# Patient Record
Sex: Female | Born: 1950 | Race: Black or African American | Hispanic: No | Marital: Single | Smoking: Former smoker
Health system: Southern US, Community
[De-identification: ages and names within clinical notes are randomized; demographics above are authoritative.]

## PROBLEM LIST (undated history)

## (undated) DIAGNOSIS — K219 Gastro-esophageal reflux disease without esophagitis: Secondary | ICD-10-CM

## (undated) DIAGNOSIS — M1990 Unspecified osteoarthritis, unspecified site: Secondary | ICD-10-CM

## (undated) DIAGNOSIS — I5022 Chronic systolic (congestive) heart failure: Secondary | ICD-10-CM

## (undated) DIAGNOSIS — M545 Low back pain, unspecified: Secondary | ICD-10-CM

## (undated) DIAGNOSIS — Z923 Personal history of irradiation: Secondary | ICD-10-CM

## (undated) DIAGNOSIS — I639 Cerebral infarction, unspecified: Secondary | ICD-10-CM

## (undated) DIAGNOSIS — D259 Leiomyoma of uterus, unspecified: Secondary | ICD-10-CM

## (undated) DIAGNOSIS — Z972 Presence of dental prosthetic device (complete) (partial): Secondary | ICD-10-CM

## (undated) DIAGNOSIS — M779 Enthesopathy, unspecified: Secondary | ICD-10-CM

## (undated) DIAGNOSIS — I779 Disorder of arteries and arterioles, unspecified: Secondary | ICD-10-CM

## (undated) DIAGNOSIS — D649 Anemia, unspecified: Secondary | ICD-10-CM

## (undated) DIAGNOSIS — R0602 Shortness of breath: Secondary | ICD-10-CM

## (undated) DIAGNOSIS — D0510 Intraductal carcinoma in situ of unspecified breast: Secondary | ICD-10-CM

## (undated) DIAGNOSIS — N184 Chronic kidney disease, stage 4 (severe): Secondary | ICD-10-CM

## (undated) DIAGNOSIS — R232 Flushing: Secondary | ICD-10-CM

## (undated) DIAGNOSIS — C50919 Malignant neoplasm of unspecified site of unspecified female breast: Secondary | ICD-10-CM

## (undated) DIAGNOSIS — B1920 Unspecified viral hepatitis C without hepatic coma: Secondary | ICD-10-CM

## (undated) DIAGNOSIS — M48061 Spinal stenosis, lumbar region without neurogenic claudication: Secondary | ICD-10-CM

## (undated) DIAGNOSIS — I10 Essential (primary) hypertension: Secondary | ICD-10-CM

## (undated) DIAGNOSIS — N201 Calculus of ureter: Secondary | ICD-10-CM

## (undated) DIAGNOSIS — J9811 Atelectasis: Secondary | ICD-10-CM

## (undated) HISTORY — PX: BACK SURGERY: SHX140

## (undated) HISTORY — DX: Chronic systolic (congestive) heart failure: I50.22

## (undated) HISTORY — DX: Anemia, unspecified: D64.9

## (undated) HISTORY — DX: Atelectasis: J98.11

## (undated) HISTORY — DX: Gastro-esophageal reflux disease without esophagitis: K21.9

## (undated) HISTORY — DX: Leiomyoma of uterus, unspecified: D25.9

## (undated) HISTORY — DX: Malignant neoplasm of unspecified site of unspecified female breast: C50.919

## (undated) HISTORY — DX: Disorder of arteries and arterioles, unspecified: I77.9

## (undated) HISTORY — DX: Unspecified osteoarthritis, unspecified site: M19.90

## (undated) HISTORY — DX: Flushing: R23.2

## (undated) HISTORY — DX: Low back pain: M54.5

## (undated) HISTORY — DX: Cerebral infarction, unspecified: I63.9

## (undated) HISTORY — DX: Low back pain, unspecified: M54.50

## (undated) HISTORY — DX: Calculus of ureter: N20.1

## (undated) HISTORY — DX: Enthesopathy, unspecified: M77.9

## (undated) HISTORY — DX: Unspecified viral hepatitis C without hepatic coma: B19.20

## (undated) HISTORY — DX: Chronic kidney disease, stage 4 (severe): N18.4

## (undated) HISTORY — DX: Intraductal carcinoma in situ of unspecified breast: D05.10

## (undated) HISTORY — DX: Essential (primary) hypertension: I10

## (undated) HISTORY — DX: Spinal stenosis, lumbar region without neurogenic claudication: M48.061

## (undated) HISTORY — DX: Personal history of irradiation: Z92.3

---

## 1998-05-17 ENCOUNTER — Encounter: Payer: Self-pay | Admitting: Emergency Medicine

## 1998-05-17 ENCOUNTER — Inpatient Hospital Stay (HOSPITAL_COMMUNITY): Admission: EM | Admit: 1998-05-17 | Discharge: 1998-05-19 | Payer: Self-pay | Admitting: Emergency Medicine

## 1998-05-18 ENCOUNTER — Encounter: Payer: Self-pay | Admitting: Internal Medicine

## 1998-06-05 ENCOUNTER — Ambulatory Visit (HOSPITAL_COMMUNITY): Admission: RE | Admit: 1998-06-05 | Discharge: 1998-06-05 | Payer: Self-pay | Admitting: *Deleted

## 1999-08-15 ENCOUNTER — Ambulatory Visit (HOSPITAL_COMMUNITY): Admission: RE | Admit: 1999-08-15 | Discharge: 1999-08-15 | Payer: Self-pay | Admitting: Internal Medicine

## 1999-08-22 ENCOUNTER — Ambulatory Visit (HOSPITAL_COMMUNITY): Admission: RE | Admit: 1999-08-22 | Discharge: 1999-08-22 | Payer: Self-pay

## 2000-06-01 DIAGNOSIS — N201 Calculus of ureter: Secondary | ICD-10-CM

## 2000-06-01 DIAGNOSIS — J9811 Atelectasis: Secondary | ICD-10-CM

## 2000-06-01 HISTORY — DX: Atelectasis: J98.11

## 2000-06-01 HISTORY — DX: Calculus of ureter: N20.1

## 2000-06-21 ENCOUNTER — Emergency Department (HOSPITAL_COMMUNITY): Admission: EM | Admit: 2000-06-21 | Discharge: 2000-06-21 | Payer: Self-pay | Admitting: Emergency Medicine

## 2000-09-11 ENCOUNTER — Emergency Department (HOSPITAL_COMMUNITY): Admission: EM | Admit: 2000-09-11 | Discharge: 2000-09-12 | Payer: Self-pay | Admitting: *Deleted

## 2000-09-11 ENCOUNTER — Encounter: Payer: Self-pay | Admitting: Emergency Medicine

## 2000-09-12 ENCOUNTER — Encounter: Payer: Self-pay | Admitting: Emergency Medicine

## 2000-09-13 ENCOUNTER — Emergency Department (HOSPITAL_COMMUNITY): Admission: EM | Admit: 2000-09-13 | Discharge: 2000-09-13 | Payer: Self-pay | Admitting: Emergency Medicine

## 2000-09-20 ENCOUNTER — Encounter: Payer: Self-pay | Admitting: Urology

## 2000-09-20 ENCOUNTER — Ambulatory Visit (HOSPITAL_COMMUNITY): Admission: RE | Admit: 2000-09-20 | Discharge: 2000-09-20 | Payer: Self-pay | Admitting: Urology

## 2000-11-11 ENCOUNTER — Encounter: Admission: RE | Admit: 2000-11-11 | Discharge: 2000-11-11 | Payer: Self-pay | Admitting: Obstetrics

## 2001-01-11 ENCOUNTER — Encounter (INDEPENDENT_AMBULATORY_CARE_PROVIDER_SITE_OTHER): Payer: Self-pay | Admitting: *Deleted

## 2001-01-11 ENCOUNTER — Encounter: Admission: RE | Admit: 2001-01-11 | Discharge: 2001-01-11 | Payer: Self-pay | Admitting: Obstetrics & Gynecology

## 2001-08-01 ENCOUNTER — Encounter: Admission: RE | Admit: 2001-08-01 | Discharge: 2001-08-01 | Payer: Self-pay | Admitting: Family Medicine

## 2001-08-01 ENCOUNTER — Encounter: Payer: Self-pay | Admitting: Family Medicine

## 2001-11-05 ENCOUNTER — Emergency Department (HOSPITAL_COMMUNITY): Admission: EM | Admit: 2001-11-05 | Discharge: 2001-11-05 | Payer: Self-pay | Admitting: Emergency Medicine

## 2001-11-25 ENCOUNTER — Ambulatory Visit (HOSPITAL_COMMUNITY): Admission: RE | Admit: 2001-11-25 | Discharge: 2001-11-25 | Payer: Self-pay | Admitting: Internal Medicine

## 2001-11-25 ENCOUNTER — Encounter: Payer: Self-pay | Admitting: Internal Medicine

## 2002-06-01 DIAGNOSIS — C50919 Malignant neoplasm of unspecified site of unspecified female breast: Secondary | ICD-10-CM

## 2002-06-01 HISTORY — DX: Malignant neoplasm of unspecified site of unspecified female breast: C50.919

## 2002-10-26 ENCOUNTER — Encounter: Admission: RE | Admit: 2002-10-26 | Discharge: 2002-11-29 | Payer: Self-pay | Admitting: Internal Medicine

## 2003-01-31 HISTORY — PX: BREAST LUMPECTOMY: SHX2

## 2003-02-08 ENCOUNTER — Encounter: Payer: Self-pay | Admitting: Family Medicine

## 2003-02-08 ENCOUNTER — Encounter: Admission: RE | Admit: 2003-02-08 | Discharge: 2003-02-08 | Payer: Self-pay | Admitting: Family Medicine

## 2003-02-14 ENCOUNTER — Encounter: Admission: RE | Admit: 2003-02-14 | Discharge: 2003-02-14 | Payer: Self-pay | Admitting: Family Medicine

## 2003-02-14 ENCOUNTER — Encounter (INDEPENDENT_AMBULATORY_CARE_PROVIDER_SITE_OTHER): Payer: Self-pay | Admitting: *Deleted

## 2003-02-14 ENCOUNTER — Encounter: Payer: Self-pay | Admitting: Family Medicine

## 2003-02-21 ENCOUNTER — Encounter (HOSPITAL_COMMUNITY): Admission: RE | Admit: 2003-02-21 | Discharge: 2003-05-22 | Payer: Self-pay | Admitting: General Surgery

## 2003-02-21 ENCOUNTER — Encounter: Payer: Self-pay | Admitting: General Surgery

## 2003-02-22 ENCOUNTER — Encounter: Payer: Self-pay | Admitting: General Surgery

## 2003-02-26 ENCOUNTER — Ambulatory Visit (HOSPITAL_COMMUNITY): Admission: RE | Admit: 2003-02-26 | Discharge: 2003-02-26 | Payer: Self-pay | Admitting: General Surgery

## 2003-02-26 ENCOUNTER — Encounter: Payer: Self-pay | Admitting: General Surgery

## 2003-02-26 ENCOUNTER — Encounter (INDEPENDENT_AMBULATORY_CARE_PROVIDER_SITE_OTHER): Payer: Self-pay | Admitting: *Deleted

## 2003-02-26 ENCOUNTER — Ambulatory Visit (HOSPITAL_BASED_OUTPATIENT_CLINIC_OR_DEPARTMENT_OTHER): Admission: RE | Admit: 2003-02-26 | Discharge: 2003-02-26 | Payer: Self-pay | Admitting: General Surgery

## 2003-02-26 ENCOUNTER — Encounter: Admission: RE | Admit: 2003-02-26 | Discharge: 2003-02-26 | Payer: Self-pay | Admitting: General Surgery

## 2003-02-26 ENCOUNTER — Encounter (INDEPENDENT_AMBULATORY_CARE_PROVIDER_SITE_OTHER): Payer: Self-pay | Admitting: General Surgery

## 2003-02-26 HISTORY — PX: MASTECTOMY, PARTIAL: SHX709

## 2003-03-29 ENCOUNTER — Ambulatory Visit: Admission: RE | Admit: 2003-03-29 | Discharge: 2003-06-27 | Payer: Self-pay | Admitting: Radiation Oncology

## 2003-04-02 HISTORY — PX: RE-EXCISION OF BREAST LUMPECTOMY: SHX6048

## 2003-04-04 ENCOUNTER — Emergency Department (HOSPITAL_COMMUNITY): Admission: EM | Admit: 2003-04-04 | Discharge: 2003-04-04 | Payer: Self-pay | Admitting: Emergency Medicine

## 2003-04-13 ENCOUNTER — Encounter: Admission: RE | Admit: 2003-04-13 | Discharge: 2003-04-13 | Payer: Self-pay | Admitting: Internal Medicine

## 2003-04-18 ENCOUNTER — Encounter: Admission: RE | Admit: 2003-04-18 | Discharge: 2003-04-18 | Payer: Self-pay | Admitting: Internal Medicine

## 2003-04-19 ENCOUNTER — Ambulatory Visit (HOSPITAL_BASED_OUTPATIENT_CLINIC_OR_DEPARTMENT_OTHER): Admission: RE | Admit: 2003-04-19 | Discharge: 2003-04-19 | Payer: Self-pay | Admitting: General Surgery

## 2003-04-19 ENCOUNTER — Ambulatory Visit (HOSPITAL_COMMUNITY): Admission: RE | Admit: 2003-04-19 | Discharge: 2003-04-19 | Payer: Self-pay | Admitting: General Surgery

## 2003-04-19 ENCOUNTER — Encounter (INDEPENDENT_AMBULATORY_CARE_PROVIDER_SITE_OTHER): Payer: Self-pay | Admitting: Specialist

## 2003-04-20 ENCOUNTER — Ambulatory Visit (HOSPITAL_COMMUNITY): Admission: RE | Admit: 2003-04-20 | Discharge: 2003-04-20 | Payer: Self-pay | Admitting: Internal Medicine

## 2003-04-24 ENCOUNTER — Encounter: Admission: RE | Admit: 2003-04-24 | Discharge: 2003-04-24 | Payer: Self-pay | Admitting: Internal Medicine

## 2003-05-08 ENCOUNTER — Encounter: Admission: RE | Admit: 2003-05-08 | Discharge: 2003-05-08 | Payer: Self-pay | Admitting: Internal Medicine

## 2003-05-30 ENCOUNTER — Encounter: Admission: RE | Admit: 2003-05-30 | Discharge: 2003-05-30 | Payer: Self-pay | Admitting: Internal Medicine

## 2003-06-02 DIAGNOSIS — Z923 Personal history of irradiation: Secondary | ICD-10-CM

## 2003-06-02 HISTORY — DX: Personal history of irradiation: Z92.3

## 2003-06-28 ENCOUNTER — Ambulatory Visit: Admission: RE | Admit: 2003-06-28 | Discharge: 2003-09-26 | Payer: Self-pay | Admitting: Radiation Oncology

## 2003-07-26 ENCOUNTER — Encounter: Admission: RE | Admit: 2003-07-26 | Discharge: 2003-07-26 | Payer: Self-pay | Admitting: Internal Medicine

## 2003-10-11 ENCOUNTER — Ambulatory Visit: Admission: RE | Admit: 2003-10-11 | Discharge: 2003-10-11 | Payer: Self-pay | Admitting: Radiation Oncology

## 2003-11-19 ENCOUNTER — Encounter: Admission: RE | Admit: 2003-11-19 | Discharge: 2003-11-19 | Payer: Self-pay | Admitting: Internal Medicine

## 2004-02-26 ENCOUNTER — Ambulatory Visit: Payer: Self-pay | Admitting: Internal Medicine

## 2004-04-10 ENCOUNTER — Ambulatory Visit: Payer: Self-pay | Admitting: Internal Medicine

## 2004-04-17 ENCOUNTER — Ambulatory Visit: Admission: RE | Admit: 2004-04-17 | Discharge: 2004-04-17 | Payer: Self-pay | Admitting: Radiation Oncology

## 2004-06-12 ENCOUNTER — Ambulatory Visit: Payer: Self-pay | Admitting: Internal Medicine

## 2004-06-13 ENCOUNTER — Ambulatory Visit: Payer: Self-pay | Admitting: Internal Medicine

## 2004-07-10 ENCOUNTER — Ambulatory Visit: Payer: Self-pay | Admitting: Internal Medicine

## 2004-08-25 ENCOUNTER — Ambulatory Visit: Payer: Self-pay | Admitting: Internal Medicine

## 2004-10-15 ENCOUNTER — Ambulatory Visit: Payer: Self-pay | Admitting: Internal Medicine

## 2004-10-28 ENCOUNTER — Ambulatory Visit (HOSPITAL_COMMUNITY): Admission: RE | Admit: 2004-10-28 | Discharge: 2004-10-28 | Payer: Self-pay | Admitting: Internal Medicine

## 2004-12-10 ENCOUNTER — Ambulatory Visit: Payer: Self-pay | Admitting: Internal Medicine

## 2005-01-28 ENCOUNTER — Emergency Department (HOSPITAL_COMMUNITY): Admission: EM | Admit: 2005-01-28 | Discharge: 2005-01-28 | Payer: Self-pay | Admitting: Emergency Medicine

## 2005-03-10 ENCOUNTER — Ambulatory Visit: Payer: Self-pay | Admitting: Hospitalist

## 2005-04-01 ENCOUNTER — Ambulatory Visit: Payer: Self-pay | Admitting: Internal Medicine

## 2005-04-06 ENCOUNTER — Ambulatory Visit: Payer: Self-pay | Admitting: Internal Medicine

## 2005-04-22 ENCOUNTER — Encounter: Admission: RE | Admit: 2005-04-22 | Discharge: 2005-04-22 | Payer: Self-pay | Admitting: Internal Medicine

## 2005-06-04 ENCOUNTER — Ambulatory Visit: Payer: Self-pay | Admitting: Hospitalist

## 2005-06-27 ENCOUNTER — Encounter: Admission: RE | Admit: 2005-06-27 | Discharge: 2005-06-27 | Payer: Self-pay

## 2005-07-09 ENCOUNTER — Ambulatory Visit: Payer: Self-pay | Admitting: Hospitalist

## 2005-07-27 ENCOUNTER — Ambulatory Visit: Payer: Self-pay | Admitting: Internal Medicine

## 2005-09-06 ENCOUNTER — Emergency Department (HOSPITAL_COMMUNITY): Admission: EM | Admit: 2005-09-06 | Discharge: 2005-09-07 | Payer: Self-pay | Admitting: Emergency Medicine

## 2005-09-06 ENCOUNTER — Emergency Department (HOSPITAL_COMMUNITY): Admission: EM | Admit: 2005-09-06 | Discharge: 2005-09-06 | Payer: Self-pay | Admitting: Emergency Medicine

## 2005-09-08 ENCOUNTER — Ambulatory Visit: Payer: Self-pay | Admitting: Hospitalist

## 2005-10-20 ENCOUNTER — Ambulatory Visit: Payer: Self-pay | Admitting: Internal Medicine

## 2005-11-05 ENCOUNTER — Ambulatory Visit: Payer: Self-pay | Admitting: Hospitalist

## 2005-11-05 ENCOUNTER — Ambulatory Visit (HOSPITAL_COMMUNITY): Admission: RE | Admit: 2005-11-05 | Discharge: 2005-11-05 | Payer: Self-pay | Admitting: Hospitalist

## 2005-11-23 ENCOUNTER — Ambulatory Visit: Payer: Self-pay | Admitting: Internal Medicine

## 2005-11-25 ENCOUNTER — Ambulatory Visit (HOSPITAL_COMMUNITY): Admission: RE | Admit: 2005-11-25 | Discharge: 2005-11-25 | Payer: Self-pay | Admitting: Internal Medicine

## 2005-11-25 ENCOUNTER — Ambulatory Visit: Payer: Self-pay | Admitting: Internal Medicine

## 2005-11-27 ENCOUNTER — Encounter: Admission: RE | Admit: 2005-11-27 | Discharge: 2005-11-27 | Payer: Self-pay | Admitting: Internal Medicine

## 2006-01-07 ENCOUNTER — Ambulatory Visit: Payer: Self-pay | Admitting: Internal Medicine

## 2006-02-05 ENCOUNTER — Ambulatory Visit: Payer: Self-pay | Admitting: Hospitalist

## 2006-02-23 ENCOUNTER — Ambulatory Visit: Payer: Self-pay | Admitting: Gastroenterology

## 2006-03-01 ENCOUNTER — Ambulatory Visit: Payer: Self-pay | Admitting: Hospitalist

## 2006-03-10 ENCOUNTER — Ambulatory Visit: Payer: Self-pay | Admitting: Internal Medicine

## 2006-03-10 ENCOUNTER — Encounter (INDEPENDENT_AMBULATORY_CARE_PROVIDER_SITE_OTHER): Payer: Self-pay | Admitting: *Deleted

## 2006-03-10 LAB — CONVERTED CEMR LAB: Magnesium: 1.9 mg/dL (ref 1.5–2.5)

## 2006-06-01 DIAGNOSIS — M779 Enthesopathy, unspecified: Secondary | ICD-10-CM

## 2006-06-01 HISTORY — DX: Enthesopathy, unspecified: M77.9

## 2006-06-10 ENCOUNTER — Ambulatory Visit (HOSPITAL_COMMUNITY): Admission: RE | Admit: 2006-06-10 | Discharge: 2006-06-10 | Payer: Self-pay | Admitting: Hospitalist

## 2006-06-10 ENCOUNTER — Ambulatory Visit: Payer: Self-pay | Admitting: Hospitalist

## 2006-06-10 LAB — CONVERTED CEMR LAB
Chloride: 100 meq/L (ref 96–112)
Creatinine, Ser: 0.87 mg/dL (ref 0.40–1.20)
Glucose, Bld: 123 mg/dL — ABNORMAL HIGH (ref 70–99)
Potassium: 4 meq/L (ref 3.5–5.3)
Sodium: 141 meq/L (ref 135–145)

## 2006-07-01 ENCOUNTER — Telehealth: Payer: Self-pay | Admitting: *Deleted

## 2006-07-12 ENCOUNTER — Emergency Department (HOSPITAL_COMMUNITY): Admission: EM | Admit: 2006-07-12 | Discharge: 2006-07-12 | Payer: Self-pay | Admitting: Family Medicine

## 2006-09-16 ENCOUNTER — Ambulatory Visit: Payer: Self-pay | Admitting: Hospitalist

## 2006-09-16 DIAGNOSIS — B182 Chronic viral hepatitis C: Secondary | ICD-10-CM

## 2006-09-16 DIAGNOSIS — K219 Gastro-esophageal reflux disease without esophagitis: Secondary | ICD-10-CM

## 2006-09-16 DIAGNOSIS — I10 Essential (primary) hypertension: Secondary | ICD-10-CM | POA: Insufficient documentation

## 2006-09-16 DIAGNOSIS — Z853 Personal history of malignant neoplasm of breast: Secondary | ICD-10-CM

## 2006-09-16 DIAGNOSIS — M545 Low back pain: Secondary | ICD-10-CM

## 2006-09-16 DIAGNOSIS — M1990 Unspecified osteoarthritis, unspecified site: Secondary | ICD-10-CM | POA: Insufficient documentation

## 2006-09-16 HISTORY — DX: Gastro-esophageal reflux disease without esophagitis: K21.9

## 2006-09-16 HISTORY — DX: Essential (primary) hypertension: I10

## 2006-10-12 ENCOUNTER — Telehealth (INDEPENDENT_AMBULATORY_CARE_PROVIDER_SITE_OTHER): Payer: Self-pay | Admitting: Pharmacy Technician

## 2006-11-03 ENCOUNTER — Telehealth: Payer: Self-pay | Admitting: *Deleted

## 2006-11-15 ENCOUNTER — Ambulatory Visit (HOSPITAL_COMMUNITY): Admission: RE | Admit: 2006-11-15 | Discharge: 2006-11-15 | Payer: Self-pay | Admitting: Hospitalist

## 2006-11-22 ENCOUNTER — Encounter (INDEPENDENT_AMBULATORY_CARE_PROVIDER_SITE_OTHER): Payer: Self-pay | Admitting: Hospitalist

## 2006-11-29 ENCOUNTER — Encounter: Admission: RE | Admit: 2006-11-29 | Discharge: 2006-11-29 | Payer: Self-pay | Admitting: Family Medicine

## 2006-12-02 ENCOUNTER — Ambulatory Visit: Payer: Self-pay | Admitting: Hospitalist

## 2006-12-02 DIAGNOSIS — D649 Anemia, unspecified: Secondary | ICD-10-CM | POA: Insufficient documentation

## 2006-12-06 LAB — CONVERTED CEMR LAB
AST: 29 units/L (ref 0–37)
Albumin: 4.4 g/dL (ref 3.5–5.2)
Basophils Absolute: 0 10*3/uL (ref 0.0–0.1)
Basophils Relative: 1 % (ref 0–1)
Calcium: 9.9 mg/dL (ref 8.4–10.5)
Chloride: 103 meq/L (ref 96–112)
Cholesterol: 181 mg/dL (ref 0–200)
Eosinophils Absolute: 0.1 10*3/uL (ref 0.0–0.7)
Glucose, Bld: 97 mg/dL (ref 70–99)
HCT: 41.3 % (ref 36.0–46.0)
HDL: 66 mg/dL (ref >39)
Hemoglobin: 13.3 g/dL (ref 12.0–15.0)
Lymphs Abs: 1.5 10*3/uL (ref 0.7–3.3)
MCHC: 32.2 g/dL (ref 30.0–36.0)
MCV: 94.5 fL (ref 78.0–100.0)
Neutrophils Relative %: 43 % (ref 43–77)
RBC: 4.37 M/uL (ref 3.87–5.11)
RDW: 14.1 % — ABNORMAL HIGH (ref 11.5–14.0)
Total CHOL/HDL Ratio: 2.7
Triglycerides: 82 mg/dL (ref <150)
VLDL: 16 mg/dL (ref 0–40)

## 2007-01-11 ENCOUNTER — Ambulatory Visit (HOSPITAL_COMMUNITY): Admission: RE | Admit: 2007-01-11 | Discharge: 2007-01-11 | Payer: Self-pay | Admitting: Hospitalist

## 2007-01-11 ENCOUNTER — Ambulatory Visit: Payer: Self-pay | Admitting: Hospitalist

## 2007-01-11 DIAGNOSIS — M79609 Pain in unspecified limb: Secondary | ICD-10-CM | POA: Insufficient documentation

## 2007-02-01 ENCOUNTER — Encounter: Admission: RE | Admit: 2007-02-01 | Discharge: 2007-02-01 | Payer: Self-pay | Admitting: Internal Medicine

## 2007-02-01 ENCOUNTER — Encounter (INDEPENDENT_AMBULATORY_CARE_PROVIDER_SITE_OTHER): Payer: Self-pay | Admitting: Diagnostic Radiology

## 2007-02-02 ENCOUNTER — Telehealth: Payer: Self-pay | Admitting: *Deleted

## 2007-02-03 ENCOUNTER — Ambulatory Visit: Payer: Self-pay | Admitting: Gastroenterology

## 2007-02-15 ENCOUNTER — Ambulatory Visit: Payer: Self-pay | Admitting: Internal Medicine

## 2007-02-15 ENCOUNTER — Encounter (INDEPENDENT_AMBULATORY_CARE_PROVIDER_SITE_OTHER): Payer: Self-pay | Admitting: Hospitalist

## 2007-02-15 ENCOUNTER — Encounter (INDEPENDENT_AMBULATORY_CARE_PROVIDER_SITE_OTHER): Payer: Self-pay | Admitting: Internal Medicine

## 2007-02-15 DIAGNOSIS — M7730 Calcaneal spur, unspecified foot: Secondary | ICD-10-CM

## 2007-02-15 LAB — CONVERTED CEMR LAB: Streptococcus, Group A Screen (Direct): NEGATIVE

## 2007-04-06 ENCOUNTER — Encounter: Admission: RE | Admit: 2007-04-06 | Discharge: 2007-04-06 | Payer: Self-pay | Admitting: Internal Medicine

## 2007-04-06 ENCOUNTER — Ambulatory Visit: Payer: Self-pay | Admitting: Hospitalist

## 2007-04-19 ENCOUNTER — Encounter (INDEPENDENT_AMBULATORY_CARE_PROVIDER_SITE_OTHER): Payer: Self-pay | Admitting: Hospitalist

## 2007-04-19 ENCOUNTER — Encounter: Payer: Self-pay | Admitting: Internal Medicine

## 2007-05-17 ENCOUNTER — Emergency Department (HOSPITAL_COMMUNITY): Admission: EM | Admit: 2007-05-17 | Discharge: 2007-05-17 | Payer: Self-pay | Admitting: Emergency Medicine

## 2007-07-05 ENCOUNTER — Telehealth: Payer: Self-pay | Admitting: *Deleted

## 2007-08-04 ENCOUNTER — Ambulatory Visit: Payer: Self-pay | Admitting: Hospitalist

## 2007-09-12 ENCOUNTER — Encounter: Admission: RE | Admit: 2007-09-12 | Discharge: 2007-09-12 | Payer: Self-pay | Admitting: Hospitalist

## 2007-10-07 ENCOUNTER — Ambulatory Visit: Payer: Self-pay | Admitting: Internal Medicine

## 2007-10-07 ENCOUNTER — Encounter (INDEPENDENT_AMBULATORY_CARE_PROVIDER_SITE_OTHER): Payer: Self-pay | Admitting: *Deleted

## 2007-10-07 ENCOUNTER — Encounter (INDEPENDENT_AMBULATORY_CARE_PROVIDER_SITE_OTHER): Payer: Self-pay | Admitting: Hospitalist

## 2007-10-10 DIAGNOSIS — N184 Chronic kidney disease, stage 4 (severe): Secondary | ICD-10-CM

## 2007-10-10 HISTORY — DX: Chronic kidney disease, stage 4 (severe): N18.4

## 2007-10-10 LAB — CONVERTED CEMR LAB
BUN: 23 mg/dL (ref 6–23)
CO2: 26 meq/L (ref 19–32)
Chloride: 108 meq/L (ref 96–112)
Creatinine, Ser: 1.51 mg/dL — ABNORMAL HIGH (ref 0.40–1.20)
Glucose, Bld: 89 mg/dL (ref 70–99)

## 2007-10-27 ENCOUNTER — Telehealth (INDEPENDENT_AMBULATORY_CARE_PROVIDER_SITE_OTHER): Payer: Self-pay | Admitting: Pharmacy Technician

## 2007-10-31 ENCOUNTER — Ambulatory Visit: Payer: Self-pay | Admitting: Hospitalist

## 2007-10-31 ENCOUNTER — Encounter (INDEPENDENT_AMBULATORY_CARE_PROVIDER_SITE_OTHER): Payer: Self-pay | Admitting: Hospitalist

## 2007-10-31 LAB — CONVERTED CEMR LAB
Calcium: 10.1 mg/dL (ref 8.4–10.5)
Creatinine, Ser: 1.68 mg/dL — ABNORMAL HIGH (ref 0.40–1.20)
Glucose, Bld: 83 mg/dL (ref 70–99)
Potassium: 3.5 meq/L (ref 3.5–5.3)

## 2007-11-01 LAB — CONVERTED CEMR LAB

## 2007-11-14 ENCOUNTER — Telehealth: Payer: Self-pay | Admitting: *Deleted

## 2007-11-30 ENCOUNTER — Ambulatory Visit: Payer: Self-pay | Admitting: Internal Medicine

## 2007-11-30 ENCOUNTER — Encounter (INDEPENDENT_AMBULATORY_CARE_PROVIDER_SITE_OTHER): Payer: Self-pay | Admitting: Internal Medicine

## 2007-11-30 LAB — CONVERTED CEMR LAB
ALT: 36 units/L — ABNORMAL HIGH (ref 0–35)
Albumin: 4.3 g/dL (ref 3.5–5.2)
CO2: 27 meq/L (ref 19–32)
Calcium: 10 mg/dL (ref 8.4–10.5)
Chloride: 105 meq/L (ref 96–112)
Glucose, Bld: 90 mg/dL (ref 70–99)
Sodium: 142 meq/L (ref 135–145)

## 2007-12-13 ENCOUNTER — Telehealth: Payer: Self-pay | Admitting: *Deleted

## 2007-12-16 ENCOUNTER — Encounter: Payer: Self-pay | Admitting: Internal Medicine

## 2007-12-23 ENCOUNTER — Encounter (INDEPENDENT_AMBULATORY_CARE_PROVIDER_SITE_OTHER): Payer: Self-pay | Admitting: Internal Medicine

## 2007-12-23 ENCOUNTER — Ambulatory Visit: Payer: Self-pay | Admitting: Internal Medicine

## 2007-12-25 LAB — CONVERTED CEMR LAB
Barbiturate Quant, Ur: NEGATIVE
Benzodiazepines.: NEGATIVE
CO2: 27 meq/L (ref 19–32)
Calcium: 10 mg/dL (ref 8.4–10.5)
Creatinine, Ser: 1.47 mg/dL — ABNORMAL HIGH (ref 0.40–1.20)
Creatinine,U: 166.9 mg/dL
Glucose, Bld: 87 mg/dL (ref 70–99)
Methadone: NEGATIVE

## 2008-01-02 ENCOUNTER — Encounter: Admission: RE | Admit: 2008-01-02 | Discharge: 2008-02-08 | Payer: Self-pay | Admitting: Internal Medicine

## 2008-01-25 ENCOUNTER — Telehealth: Payer: Self-pay | Admitting: *Deleted

## 2008-01-31 ENCOUNTER — Encounter: Payer: Self-pay | Admitting: Internal Medicine

## 2008-02-01 ENCOUNTER — Encounter (INDEPENDENT_AMBULATORY_CARE_PROVIDER_SITE_OTHER): Payer: Self-pay | Admitting: Internal Medicine

## 2008-02-01 ENCOUNTER — Ambulatory Visit: Payer: Self-pay | Admitting: Internal Medicine

## 2008-02-01 LAB — CONVERTED CEMR LAB: Streptococcus, Group A Screen (Direct): NEGATIVE

## 2008-02-21 ENCOUNTER — Ambulatory Visit: Payer: Self-pay | Admitting: Internal Medicine

## 2008-02-22 ENCOUNTER — Encounter: Admission: RE | Admit: 2008-02-22 | Discharge: 2008-02-22 | Payer: Self-pay | Admitting: Hospitalist

## 2008-03-08 ENCOUNTER — Encounter: Admission: RE | Admit: 2008-03-08 | Discharge: 2008-05-31 | Payer: Self-pay | Admitting: Internal Medicine

## 2008-03-13 ENCOUNTER — Telehealth: Payer: Self-pay | Admitting: *Deleted

## 2008-03-15 ENCOUNTER — Telehealth: Payer: Self-pay | Admitting: *Deleted

## 2008-03-21 ENCOUNTER — Emergency Department (HOSPITAL_COMMUNITY): Admission: EM | Admit: 2008-03-21 | Discharge: 2008-03-21 | Payer: Self-pay | Admitting: Emergency Medicine

## 2008-03-21 ENCOUNTER — Telehealth: Payer: Self-pay | Admitting: *Deleted

## 2008-04-17 ENCOUNTER — Telehealth: Payer: Self-pay | Admitting: *Deleted

## 2008-04-30 ENCOUNTER — Ambulatory Visit: Payer: Self-pay | Admitting: Infectious Diseases

## 2008-06-07 ENCOUNTER — Ambulatory Visit: Payer: Self-pay | Admitting: Internal Medicine

## 2008-06-07 ENCOUNTER — Encounter: Payer: Self-pay | Admitting: Internal Medicine

## 2008-06-07 LAB — CONVERTED CEMR LAB
ALT: 40 units/L — ABNORMAL HIGH (ref 0–35)
AST: 29 units/L (ref 0–37)
Alkaline Phosphatase: 77 units/L (ref 39–117)
Creatinine, Ser: 1.16 mg/dL (ref 0.40–1.20)
Sodium: 141 meq/L (ref 135–145)
Total Bilirubin: 0.4 mg/dL (ref 0.3–1.2)

## 2008-06-11 ENCOUNTER — Telehealth: Payer: Self-pay | Admitting: *Deleted

## 2008-06-22 ENCOUNTER — Telehealth: Payer: Self-pay | Admitting: Internal Medicine

## 2008-07-11 ENCOUNTER — Telehealth: Payer: Self-pay | Admitting: *Deleted

## 2008-07-25 ENCOUNTER — Ambulatory Visit: Payer: Self-pay | Admitting: Internal Medicine

## 2008-07-30 ENCOUNTER — Ambulatory Visit (HOSPITAL_COMMUNITY): Admission: RE | Admit: 2008-07-30 | Discharge: 2008-07-30 | Payer: Self-pay | Admitting: Internal Medicine

## 2008-08-29 ENCOUNTER — Ambulatory Visit: Payer: Self-pay | Admitting: Internal Medicine

## 2008-08-29 ENCOUNTER — Encounter (INDEPENDENT_AMBULATORY_CARE_PROVIDER_SITE_OTHER): Payer: Self-pay | Admitting: Internal Medicine

## 2008-08-29 DIAGNOSIS — F1910 Other psychoactive substance abuse, uncomplicated: Secondary | ICD-10-CM | POA: Insufficient documentation

## 2008-08-30 LAB — CONVERTED CEMR LAB
BUN: 21 mg/dL (ref 6–23)
Benzodiazepines.: NEGATIVE
CO2: 26 meq/L (ref 19–32)
Creatinine, Ser: 1.28 mg/dL — ABNORMAL HIGH (ref 0.40–1.20)
Creatinine,U: 234.3 mg/dL
GFR calc Af Amer: 52 mL/min — ABNORMAL LOW (ref >60)
GFR calc non Af Amer: 43 mL/min — ABNORMAL LOW (ref >60)
Glucose, Bld: 95 mg/dL (ref 70–99)
Marijuana Metabolite: POSITIVE — AB
Microalb Creat Ratio: 84.2 mg/g — ABNORMAL HIGH (ref 0.0–30.0)
Microalb, Ur: 20.47 mg/dL — ABNORMAL HIGH (ref 0.00–1.89)
Phencyclidine (PCP): NEGATIVE
Propoxyphene: NEGATIVE
Total Bilirubin: 0.3 mg/dL (ref 0.3–1.2)
Total CHOL/HDL Ratio: 2.6
Total Protein: 7 g/dL (ref 6.0–8.3)

## 2008-09-20 ENCOUNTER — Encounter (INDEPENDENT_AMBULATORY_CARE_PROVIDER_SITE_OTHER): Payer: Self-pay | Admitting: Internal Medicine

## 2008-09-20 ENCOUNTER — Ambulatory Visit: Payer: Self-pay | Admitting: Internal Medicine

## 2008-09-21 LAB — CONVERTED CEMR LAB
BUN: 20 mg/dL (ref 6–23)
Chloride: 105 meq/L (ref 96–112)
GFR calc non Af Amer: 45 mL/min — ABNORMAL LOW (ref >60)
Potassium: 3.8 meq/L (ref 3.5–5.3)

## 2008-10-09 ENCOUNTER — Encounter: Payer: Self-pay | Admitting: Internal Medicine

## 2008-10-15 ENCOUNTER — Encounter: Admission: RE | Admit: 2008-10-15 | Discharge: 2008-10-15 | Payer: Self-pay | Admitting: Neurological Surgery

## 2008-11-05 ENCOUNTER — Telehealth: Payer: Self-pay | Admitting: *Deleted

## 2008-11-12 ENCOUNTER — Telehealth: Payer: Self-pay | Admitting: Internal Medicine

## 2008-11-27 ENCOUNTER — Telehealth: Payer: Self-pay | Admitting: *Deleted

## 2008-11-29 ENCOUNTER — Telehealth: Payer: Self-pay | Admitting: Internal Medicine

## 2008-12-24 ENCOUNTER — Telehealth: Payer: Self-pay | Admitting: *Deleted

## 2009-01-17 ENCOUNTER — Ambulatory Visit: Payer: Self-pay | Admitting: Internal Medicine

## 2009-01-17 DIAGNOSIS — R252 Cramp and spasm: Secondary | ICD-10-CM

## 2009-01-18 LAB — CONVERTED CEMR LAB
ALT: 38 units/L — ABNORMAL HIGH (ref 0–35)
Albumin: 4.3 g/dL (ref 3.5–5.2)
Barbiturate Quant, Ur: NEGATIVE
CO2: 28 meq/L (ref 19–32)
Calcium: 10 mg/dL (ref 8.4–10.5)
Chloride: 104 meq/L (ref 96–112)
Creatinine,U: 276 mg/dL
Magnesium: 1.8 mg/dL (ref 1.5–2.5)
Opiates: NEGATIVE
Propoxyphene: NEGATIVE
Sodium: 143 meq/L (ref 135–145)
Total Protein: 7.3 g/dL (ref 6.0–8.3)

## 2009-01-23 ENCOUNTER — Telehealth: Payer: Self-pay | Admitting: *Deleted

## 2009-01-25 ENCOUNTER — Ambulatory Visit: Payer: Self-pay | Admitting: Internal Medicine

## 2009-02-06 ENCOUNTER — Encounter: Payer: Self-pay | Admitting: Licensed Clinical Social Worker

## 2009-02-07 ENCOUNTER — Telehealth: Payer: Self-pay | Admitting: Licensed Clinical Social Worker

## 2009-02-15 ENCOUNTER — Encounter: Payer: Self-pay | Admitting: Internal Medicine

## 2009-02-22 ENCOUNTER — Encounter: Admission: RE | Admit: 2009-02-22 | Discharge: 2009-02-22 | Payer: Self-pay | Admitting: Internal Medicine

## 2009-02-22 ENCOUNTER — Telehealth: Payer: Self-pay | Admitting: *Deleted

## 2009-03-21 ENCOUNTER — Telehealth (INDEPENDENT_AMBULATORY_CARE_PROVIDER_SITE_OTHER): Payer: Self-pay | Admitting: *Deleted

## 2009-04-10 ENCOUNTER — Telehealth: Payer: Self-pay | Admitting: Internal Medicine

## 2009-04-17 ENCOUNTER — Ambulatory Visit: Payer: Self-pay | Admitting: Internal Medicine

## 2009-04-19 DIAGNOSIS — F17200 Nicotine dependence, unspecified, uncomplicated: Secondary | ICD-10-CM | POA: Insufficient documentation

## 2009-04-19 HISTORY — DX: Nicotine dependence, unspecified, uncomplicated: F17.200

## 2009-05-27 ENCOUNTER — Inpatient Hospital Stay (HOSPITAL_COMMUNITY): Admission: RE | Admit: 2009-05-27 | Discharge: 2009-05-31 | Payer: Self-pay | Admitting: Neurological Surgery

## 2009-05-27 HISTORY — PX: LAMINECTOMY: SHX219

## 2009-07-08 ENCOUNTER — Encounter: Admission: RE | Admit: 2009-07-08 | Discharge: 2009-07-08 | Payer: Self-pay | Admitting: Neurological Surgery

## 2009-07-17 ENCOUNTER — Ambulatory Visit: Payer: Self-pay | Admitting: Internal Medicine

## 2009-07-17 LAB — CONVERTED CEMR LAB: Creatinine, Ser: 1.56 mg/dL — ABNORMAL HIGH (ref 0.40–1.20)

## 2009-07-19 DIAGNOSIS — M48061 Spinal stenosis, lumbar region without neurogenic claudication: Secondary | ICD-10-CM

## 2009-07-22 ENCOUNTER — Ambulatory Visit: Payer: Self-pay | Admitting: Internal Medicine

## 2009-07-22 LAB — CONVERTED CEMR LAB: OCCULT 1: NEGATIVE

## 2009-09-03 ENCOUNTER — Encounter: Admission: RE | Admit: 2009-09-03 | Discharge: 2009-09-03 | Payer: Self-pay | Admitting: Neurological Surgery

## 2009-10-08 ENCOUNTER — Telehealth: Payer: Self-pay | Admitting: Internal Medicine

## 2009-10-24 ENCOUNTER — Encounter: Admission: RE | Admit: 2009-10-24 | Discharge: 2009-11-29 | Payer: Self-pay | Admitting: Neurological Surgery

## 2009-10-30 ENCOUNTER — Ambulatory Visit: Payer: Self-pay | Admitting: Internal Medicine

## 2009-10-30 LAB — CONVERTED CEMR LAB
ALT: 38 units/L — ABNORMAL HIGH (ref 0–35)
Alkaline Phosphatase: 105 units/L (ref 39–117)
Basophils Absolute: 0.1 10*3/uL (ref 0.0–0.1)
Basophils Relative: 1 % (ref 0–1)
Bilirubin Urine: NEGATIVE
Cholesterol, target level: 200 mg/dL
HDL goal, serum: 40 mg/dL
Hemoglobin, Urine: NEGATIVE
Ketones, ur: NEGATIVE mg/dL
LDL Goal: 130 mg/dL
Lymphocytes Relative: 43 % (ref 12–46)
MCHC: 31.7 g/dL (ref 30.0–36.0)
Monocytes Relative: 9 % (ref 3–12)
Neutro Abs: 1.9 10*3/uL (ref 1.7–7.7)
Neutrophils Relative %: 40 % — ABNORMAL LOW (ref 43–77)
Potassium: 4.1 meq/L (ref 3.5–5.3)
Protein, ur: NEGATIVE mg/dL
RBC: 4.23 M/uL (ref 3.87–5.11)
Sodium: 140 meq/L (ref 135–145)
Total Bilirubin: 0.3 mg/dL (ref 0.3–1.2)
Total Protein: 7.4 g/dL (ref 6.0–8.3)
Urine Glucose: NEGATIVE mg/dL

## 2009-11-19 ENCOUNTER — Telehealth: Payer: Self-pay | Admitting: *Deleted

## 2009-12-18 ENCOUNTER — Ambulatory Visit: Payer: Self-pay | Admitting: Internal Medicine

## 2009-12-18 LAB — CONVERTED CEMR LAB
BUN: 20 mg/dL (ref 6–23)
CO2: 29 meq/L (ref 19–32)
Calcium: 10 mg/dL (ref 8.4–10.5)
Creatinine, Ser: 1.36 mg/dL — ABNORMAL HIGH (ref 0.40–1.20)
Glucose, Bld: 86 mg/dL (ref 70–99)

## 2010-02-17 ENCOUNTER — Telehealth (INDEPENDENT_AMBULATORY_CARE_PROVIDER_SITE_OTHER): Payer: Self-pay | Admitting: *Deleted

## 2010-03-03 ENCOUNTER — Telehealth: Payer: Self-pay | Admitting: *Deleted

## 2010-04-30 ENCOUNTER — Ambulatory Visit: Payer: Self-pay | Admitting: Internal Medicine

## 2010-05-07 ENCOUNTER — Telehealth: Payer: Self-pay

## 2010-05-22 ENCOUNTER — Encounter
Admission: RE | Admit: 2010-05-22 | Discharge: 2010-05-22 | Payer: Self-pay | Source: Home / Self Care | Attending: Internal Medicine | Admitting: Internal Medicine

## 2010-06-18 ENCOUNTER — Ambulatory Visit: Admit: 2010-06-18 | Payer: Self-pay | Admitting: Obstetrics and Gynecology

## 2010-06-21 ENCOUNTER — Encounter: Payer: Self-pay | Admitting: Internal Medicine

## 2010-06-22 ENCOUNTER — Encounter: Payer: Self-pay | Admitting: Hospitalist

## 2010-06-22 ENCOUNTER — Encounter: Payer: Self-pay | Admitting: Internal Medicine

## 2010-06-22 ENCOUNTER — Encounter: Payer: Self-pay | Admitting: Neurological Surgery

## 2010-07-01 NOTE — Progress Notes (Signed)
Summary: Prescription for Potassium  Phone Note Refill Request Message from:  Patient on November 19, 2009 9:49 AM  Pt says that she did not get her prescription for Potassium.  Pt said  that she is beginning to have leg cramps.  Pt also said that Dr. Marinda Elk told her she was going to write the prescription at her last visit.   Method Requested: Electronic Initial call taken by: Sander Nephew RN,  November 19, 2009 9:51 AM  Follow-up for Phone Call        Patient's potassium was normal; she doesn't need a potassium supplement.  If she continues to have leg cramps, it would be best if she scheduled an appointment to have them further assessed. Follow-up by: Bertha Stakes MD,  November 19, 2009 1:46 PM  Additional Follow-up for Phone Call Additional follow up Details #1::        RTC to pt said that she continues to have the leg cramps.  Pt was informed that her Potassium level was normal.  Pt to be scheduled for an appointment for evaluation of her leg cramps. Additional Follow-up by: Sander Nephew RN,  November 27, 2009 12:06 PM

## 2010-07-01 NOTE — Assessment & Plan Note (Signed)
Summary: EST-CK/FU/MEDS/CFB   Vital Signs:  Patient profile:   60 year old female Height:      64 inches Weight:      130.3 pounds BMI:     22.45 Temp:     97.2 degrees F oral Pulse rate:   57 / minute BP sitting:   138 / 89  (right arm)  Vitals Entered By: Silverio Decamp NT II (October 30, 2009 8:57 AM) CC: MUSCLE SPASMS IN BOTH LEGS/   Is Patient Diabetic? No Pain Assessment Patient in pain? yes     Location: LEGS Intensity: 8 Type: aching Onset of pain  COUPLE OF MONTH Nutritional Status BMI of 19 -24 = normal  Have you ever been in a relationship where you felt threatened, hurt or afraid?No   Does patient need assistance? Functional Status Self care Ambulation Normal   Primary Care Provider:  Bertha Stakes MD  CC:  MUSCLE SPASMS IN BOTH LEGS/  .  History of Present Illness: Patient returns for follow-up of hypertension, GERD, renal insufficiency, and other chronic medical problems.  Today she reports muscle cramps in her calves bilaterally for the past week; these occasionally awaken her from sleep.  She denies any exertional leg or calf pain.  She has no other acute complaints.  She was unable to stop smoking with Chantix, and reports that she is smoking about 3-4 cigarettes/day.  She reports that she has started physical therapy on referral by her neurosurgeon Dr. Ronnald Ramp.  She has continued to do well since her back surgery in December of 2010.  She reports that she is compliant with her medications.   Preventive Screening-Counseling & Management  Alcohol-Tobacco     Alcohol type: beer / at times     Smoking Status: current     Smoking Cessation Counseling: yes     Packs/Day: 3-4 cigs per day     Year Started: at age 21     Year Quit: SEPT 16, 2009  Caffeine-Diet-Exercise     Does Patient Exercise: yes     Type of exercise: WALKING     Times/week: 7      Drug Use:  denies current use.    Current Medications (verified): 1)  Norvasc 10 Mg Tabs (Amlodipine  Besylate) .... Take One By Mouth Daily 2)  Tri-Balance Orthotics Womens   Misc ([place]) .... For Heel Spur 3)  Catapres 0.1 Mg  Tabs (Clonidine Hcl) .... Take 1 Pill By Mouth Two Times A Day. 4)  Omeprazole 40 Mg Cpdr (Omeprazole) .... Take 1 Tablet By Mouth Two Times A Day 5)  Lisinopril 10 Mg Tabs (Lisinopril) .... Take 1 Tablet By Mouth Once A Day 6)  Flonase 50 Mcg/act Susp (Fluticasone Propionate) .... Apply 2 Sprays in Each Nostril At Bedtime 7)  Cetirizine Hcl 10 Mg Tabs (Cetirizine Hcl) .... Take 1 Tablet By Mouth Once A Day 8)  Famotidine 20 Mg Tabs (Famotidine) .... Take 1 Tablet By Mouth Once A Day At Bedtime 9)  Zolpidem Tartrate 10 Mg Tabs (Zolpidem Tartrate) .... Take 1 Tablet By Mouth At Bedtime As Needed For Insomnia.  Allergies (verified): No Known Drug Allergies  Social History: Drug Use:  denies current use  Review of Systems General:  Denies loss of appetite and weight loss. CV:  Denies chest pain or discomfort, shortness of breath with exertion, and swelling of feet. Resp:  Denies cough and shortness of breath. GI:  Denies abdominal pain, nausea, and vomiting. GU:  Denies dysuria and urinary frequency. Psych:  Denies anxiety and depression.  Physical Exam  General:  alert, no distress Lungs:  normal respiratory effort, normal breath sounds, no crackles, and no wheezes.   Heart:  normal rate, regular rhythm, no murmur, no gallop, and no rub.   Abdomen:  soft, non-tender, normal bowel sounds, no hepatomegaly, and no splenomegaly.   Pulses:  pedal pulses normal bilaterally Extremities:  no edema   Impression & Recommendations:  Problem # 1:  HYPERTENSION (ICD-401.9) Patient's blood pressure is well controlled on current regimen.  Plan is to continue current antihypertensive medications.  Her updated medication list for this problem includes:    Norvasc 10 Mg Tabs (Amlodipine besylate) .Marland Kitchen... Take one by mouth daily    Catapres 0.1 Mg Tabs  (Clonidine hcl) .Marland Kitchen... Take 1 pill by mouth two times a day.    Lisinopril 10 Mg Tabs (Lisinopril) .Marland Kitchen... Take 1 tablet by mouth once a day  BP today: 138/89 Prior BP: 115/80 (07/17/2009)  10 Yr Risk Heart Disease: 6 %  Labs Reviewed: K+: 4.2 (07/17/2009) Creat: : 1.56 (07/17/2009)   Chol: 173 (08/29/2008)   HDL: 66 (08/29/2008)   LDL: 87 (08/29/2008)   TG: 98 (08/29/2008)  Problem # 2:  GERD (ICD-530.81) Symptoms are controlled on current medications.  Her updated medication list for this problem includes:    Omeprazole 40 Mg Cpdr (Omeprazole) .Marland Kitchen... Take 1 tablet by mouth two times a day    Famotidine 20 Mg Tabs (Famotidine) .Marland Kitchen... Take 1 tablet by mouth once a day at bedtime  Problem # 3:  TOBACCO ABUSE (ICD-305.1) Patient reports that she was unable to stop smoking using Chantix.  She reports that Medicaid would not pay for nicotine patches; will look into this.  The following medications were removed from the medication list:    Chantix Starting Month Pak 0.5 Mg X 11 & 1 Mg X 42 Tabs (Varenicline tartrate) .Marland Kitchen... Take as directed for smoking cessation  Problem # 4:  RENAL INSUFFICIENCY (ICD-588.9) Creatinine was 1.56 in February, and patient had similar elevation in 2009.  Will check labs as below, and assess further depending upon serum creatinine.  Orders: T-Comprehensive Metabolic Panel ([id number]) T-CBC w/Diff [phone]) T-Urinalysis [id number])  Problem # 5:  SPINAL STENOSIS, LUMBAR (ICD-724.02) Patient has done well following spinal surgery in December 2010 by neurosurgeon Dr. Ronnald Ramp.  She reports that she has started physical therapy.  She will follow-up with Dr. Ronnald Ramp.  Problem # 6:  HEPATITIS C (ICD-070.51) Patient was reportedly referred to hepatitis clinic in past but did not follow-up; need to review records.  As above, will check CMP today.  Problem # 7:  LEG CRAMPS (ICD-729.82) Will check a metabolic panel today.  Complete Medication List: 1)  Norvasc 10  Mg Tabs (Amlodipine besylate) .... Take one by mouth daily 2)  Tri-balance Orthotics Womens Misc (Foot care products) .... For heel spur 3)  Catapres 0.1 Mg Tabs (Clonidine hcl) .... Take 1 pill by mouth two times a day. 4)  Omeprazole 40 Mg Cpdr (Omeprazole) .... Take 1 tablet by mouth two times a day 5)  Lisinopril 10 Mg Tabs (Lisinopril) .... Take 1 tablet by mouth once a day 6)  Flonase 50 Mcg/act Susp (Fluticasone propionate) .... Apply 2 sprays in each nostril at bedtime 7)  Cetirizine Hcl 10 Mg Tabs (Cetirizine hcl) .... Take 1 tablet by mouth once a day 8)  Famotidine 20 Mg Tabs (Famotidine) .... Take 1 tablet by mouth once  a day at bedtime 9)  Zolpidem Tartrate 10 Mg Tabs (Zolpidem tartrate) .... Take 1 tablet by mouth at bedtime as needed for insomnia.  Other Orders: Gynecologic Referral (Gyn)  Patient Instructions: 1)  Please schedule a follow-up appointment in 3 months. 2)  An appointment will be made for a Pap smear  Prevention & Chronic Care Immunizations   Influenza vaccine: Not documented   Influenza vaccine deferral: Refused  (07/17/2009)   Influenza vaccine due: 01/30/2010    Tetanus booster: Not documented   Td booster deferral: Refused  (10/30/2009)    Pneumococcal vaccine: Not documented  Colorectal Screening   Hemoccult: negative X3  (07/22/2009)   Hemoccult action/deferral: Ordered  (07/17/2009)    Colonoscopy: Not documented  Other Screening   Pap smear: Specimen Adequacy: Satisfactory for evaluation.   Interpretation/Result:Negative for intraepithelial Lesion or Malignancy.     (11/01/2007)   Pap smear action/deferral: GYN Referral  (10/30/2009)   Pap smear due: 10/2008    Mammogram: BI-RADS CATEGORY 2:  Benign finding(s).^MM DIGITAL DIAGNOSTIC BILAT  (02/22/2009)   Mammogram action/deferral: Repeat in 2 months.   (09/12/2007)   Mammogram due: 02/22/2010  Reports requested:   Last colonoscopy report requested.  Smoking status: current   (10/30/2009)   Smoking cessation counseling: yes  (10/30/2009)    Screening comments: Reports that she has had a colonoscopy.  Lipids   Total Cholesterol: 173  (08/29/2008)   LDL: 87  (08/29/2008)   LDL Direct: Not documented   HDL: 66  (08/29/2008)   Triglycerides: 98  (08/29/2008)  Hypertension   Last Blood Pressure: 138 / 89  (10/30/2009)   Serum creatinine: 1.56  (07/17/2009)   BMP action: Ordered   Serum potassium 4.2  (07/17/2009) CMP ordered     Hypertension flowsheet reviewed?: Yes   Progress toward BP goal: At goal  Self-Management Support :   Personal Goals (by the next clinic visit) :      Personal blood pressure goal: 140/90  (04/17/2009)   Patient will work on the following items until the next clinic visit to reach self-care goals:     Medications and monitoring: take my medicines every day  (10/30/2009)     Eating: drink diet soda or water instead of juice or soda, eat more vegetables, use fresh or frozen vegetables, eat foods that are low in salt, eat baked foods instead of fried foods, eat fruit for snacks and desserts  (10/30/2009)     Activity: take a 30 minute walk every day  (10/30/2009)    Hypertension self-management support: Written self-care plan  (10/30/2009)   Hypertension self-care plan printed.   Nursing Instructions: Gyn referral for screening Pap (see order) Request report of last colonoscopy    Process Orders Check Orders Results:     [facility]: Check successful Tests Sent for requisitioning (October 30, 2009 4:46 PM):     10/30/2009: [facility] -- T-Comprehensive Metabolic Panel [id number] (signed)     10/30/2009: [facility] -- T-CBC w/Diff [id number] (signed)     10/30/2009: [facility] -- T-Urinalysis [id number] (signed)

## 2010-07-01 NOTE — Progress Notes (Signed)
Summary: REfill/gh  Phone Note Refill Request Message from:  Fax from Pharmacy on February 17, 2010 4:03 PM  Refills Requested: Medication #1:  LISINOPRIL 10 MG TABS Take 1 tablet by mouth once a day  Medication #2:  NORVASC 10 MG TABS take one by mouth daily  Medication #3:  CATAPRES 0.1 MG  TABS take 1 pill by mouth two times a day.  Method Requested: Electronic Initial call taken by: Sander Nephew RN,  February 17, 2010 4:04 PM    Prescriptions: LISINOPRIL 10 MG TABS (LISINOPRIL) Take 1 tablet by mouth once a day  #93 x 3   Entered and Authorized by:   Burman Freestone MD   Signed by:   Burman Freestone MD on 02/17/2010   Method used:   Electronically to        [facility] (retail)       [phone]       [place], [place]  [address]       Ph: [phone]       Fax: [phone]   RxID:   [id number] CATAPRES 0.1 MG  TABS (CLONIDINE HCL) take 1 pill by mouth two times a day.  #186 x 3   Entered and Authorized by:   Burman Freestone MD   Signed by:   Burman Freestone MD on 02/17/2010   Method used:   Electronically to        [facility] (retail)       [phone]       [place], [place]  [address]       Ph: [phone]       Fax: [phone]   RxID:   [id number] NORVASC 10 MG TABS (AMLODIPINE BESYLATE) take one by mouth daily  #93 x 3   Entered and Authorized by:   Burman Freestone MD   Signed by:   Burman Freestone MD on 02/17/2010   Method used:   Electronically to        [facility] (retail)       [phone]       [place], [place]  [address]       Ph: [phone]       Fax: [phone]   RxID:   [id number]

## 2010-07-01 NOTE — Progress Notes (Signed)
Summary: Refill Request for Zolpidem  Phone Note Refill Request Message from:  Pharmacy  Refills Requested: Medication #1:  ZOLPIDEM TARTRATE 10 MG TABS Take 1 tablet by mouth at bedtime as needed for insomnia.   Last Refilled: 10/22/2009 Electronic refill request received.  Initial call taken by: Bertha Stakes MD,  March 03, 2010 4:13 PM  Follow-up for Phone Call        Refill approved-nurse to complete. Follow-up by: Bertha Stakes MD,  March 03, 2010 4:14 PM  Additional Follow-up for Phone Call Additional follow up Details #1::        Rx called to pharmacy Additional Follow-up by: Gevena Cotton RN,  March 04, 2010 9:34 AM    Prescriptions: ZOLPIDEM TARTRATE 10 MG TABS (ZOLPIDEM TARTRATE) Take 1 tablet by mouth at bedtime as needed for insomnia.  #30 x 2   Entered and Authorized by:   Bertha Stakes MD   Signed by:   Bertha Stakes MD on 03/03/2010   Method used:   Telephoned to ...       [facility] (retail)       [phone]       [place], [place]  [address]       Ph: [phone]       Fax: [phone]   RxID:   [id number]

## 2010-07-01 NOTE — Progress Notes (Signed)
Summary: GYN APPOINTMENT  Phone Note Outgoing Call   Reason for Call: Schedule Patient Appt Call placed by: [facility] NT II,  May 07, 2010 2:22 PM Call placed to: Patient Action Taken: Appt scheduled Request: Send information Reason for Call: Confirm/change Appt Summary of Call: [place] [phone] @2 ;15 Initial call taken by: [facility] NT II,  May 07, 2010 2:23 PM

## 2010-07-01 NOTE — Assessment & Plan Note (Signed)
Summary: est-ck/fu/meds/cfb   Vital Signs:  Patient profile:   60 year old female Height:      64 inches Weight:      130.4 pounds BMI:     22.46 Temp:     97.6 degrees F oral Pulse rate:   60 / minute BP sitting:   139 / 88  (right arm)  Vitals Entered By: Silverio Decamp NT II (December 18, 2009 9:22 AM) CC: R LEG CRAMPS  Is Patient Diabetic? No Pain Assessment Patient in pain? yes     Location: bilateral leg  Intensity: 9 Type: aching Onset of pain  1 MONTH Nutritional Status BMI of 19 -24 = normal  Have you ever been in a relationship where you felt threatened, hurt or afraid?No   Does patient need assistance? Functional Status Self care Ambulation Normal   Primary Care Provider:  Bertha Stakes MD  CC:  R LEG CRAMPS .  History of Present Illness: Patient presents with complaint of nocturnal right leg cramps for about 1 month; these affect the right calf, occur when she is lying in bed, and are relieved by getting up and stretching.  She has no other complaints.  She denies any low back pain or sciatic pain, and denies any numbness in the right leg or foot.  She reports that she is compliant with her medications.  Current Medications (verified): 1)  Norvasc 10 Mg Tabs (Amlodipine Besylate) .... Take One By Mouth Daily 2)  Tri-Balance Orthotics Womens   Misc ([place]) .... For Heel Spur 3)  Catapres 0.1 Mg  Tabs (Clonidine Hcl) .... Take 1 Pill By Mouth Two Times A Day. 4)  Omeprazole 40 Mg Cpdr (Omeprazole) .... Take 1 Tablet By Mouth Two Times A Day 5)  Lisinopril 10 Mg Tabs (Lisinopril) .... Take 1 Tablet By Mouth Once A Day 6)  Flonase 50 Mcg/act Susp (Fluticasone Propionate) .... Apply 2 Sprays in Each Nostril At Bedtime 7)  Cetirizine Hcl 10 Mg Tabs (Cetirizine Hcl) .... Take 1 Tablet By Mouth Once A Day 8)  Famotidine 20 Mg Tabs (Famotidine) .... Take 1 Tablet By Mouth Once A Day At Bedtime 9)  Zolpidem Tartrate 10 Mg Tabs (Zolpidem Tartrate) .... Take 1  Tablet By Mouth At Bedtime As Needed For Insomnia.  Allergies (verified): No Known Drug Allergies  Physical Exam  General:  alert, no distress Lungs:  normal respiratory effort, normal breath sounds, no crackles, and no wheezes.   Heart:  normal rate, regular rhythm, no murmur, no gallop, and no rub.   Abdomen:  soft, non-tender, normal bowel sounds, no hepatomegaly, and no splenomegaly.   Msk:  exam of right lower extremity shows normal ROM of hip and knee; no knee swelling, warmth, tenderness, or effusion Pulses:  R posterior tibial normal and R dorsalis pedis normal.   Extremities:  no edema; no calf tenderness; negative Homan's Neurologic:  right straight leg raise negative   Impression & Recommendations:  Problem # 1:  LEG CRAMPS (ICD-729.82) Patient has nocturnal leg cramps with no clear etiology.  Plan is to check labs as below, and treat with Vitamin B complex and stretching exercises.  Will reassess in 1 month.  Orders: T-Basic Metabolic Panel ([id number]) T-Magnesium [phone]) T-TSH [phone]) T-CK Total [phone])  Complete Medication List: 1)  Norvasc 10 Mg Tabs (Amlodipine besylate) .... Take one by mouth daily 2)  Tri-balance Orthotics Womens Misc (Foot care products) .... For heel spur 3)  Catapres 0.1 Mg Tabs (Clonidine  hcl) .... Take 1 pill by mouth two times a day. 4)  Omeprazole 40 Mg Cpdr (Omeprazole) .... Take 1 tablet by mouth two times a day 5)  Lisinopril 10 Mg Tabs (Lisinopril) .... Take 1 tablet by mouth once a day 6)  Flonase 50 Mcg/act Susp (Fluticasone propionate) .... Apply 2 sprays in each nostril at bedtime 7)  Cetirizine Hcl 10 Mg Tabs (Cetirizine hcl) .... Take 1 tablet by mouth once a day 8)  Famotidine 20 Mg Tabs (Famotidine) .... Take 1 tablet by mouth once a day at bedtime 9)  Zolpidem Tartrate 10 Mg Tabs (Zolpidem tartrate) .... Take 1 tablet by mouth at bedtime as needed for insomnia. 10)  Vitamin B Complex Tabs (B complex  vitamins) .... Take 1 tablet by mouth once a day  Patient Instructions: 1)  Please schedule a follow-up appointment in 1 month. 2)  Start vitamin B complex one tablet daily. 3)  Do stretching exercises as directed.  Prevention & Chronic Care Immunizations   Influenza vaccine: Not documented   Influenza vaccine deferral: Refused  (07/17/2009)   Influenza vaccine due: 01/30/2010    Tetanus booster: Not documented   Td booster deferral: Refused  (10/30/2009)    Pneumococcal vaccine: Not documented  Colorectal Screening   Hemoccult: negative X3  (07/22/2009)   Hemoccult action/deferral: Ordered  (07/17/2009)    Colonoscopy: Not documented   Colonoscopy action/deferral: Deferred  (12/18/2009)  Other Screening   Pap smear: Specimen Adequacy: Satisfactory for evaluation.   Interpretation/Result:Negative for intraepithelial Lesion or Malignancy.     (11/01/2007)   Pap smear action/deferral: GYN Referral  (10/30/2009)   Pap smear due: 10/2008    Mammogram: BI-RADS CATEGORY 2:  Benign finding(s).^MM DIGITAL DIAGNOSTIC BILAT  (02/22/2009)   Mammogram action/deferral: Deferred  (12/18/2009)   Mammogram due: 02/22/2010   Smoking status: current  (10/30/2009)   Smoking cessation counseling: yes  (10/30/2009)    Screening comments: I discussed and colonoscopy for colon cancer screening; patient would like to consider this and discuss again at next visit.  Lipids   Total Cholesterol: 173  (08/29/2008)   LDL: 87  (08/29/2008)   LDL Direct: Not documented   HDL: 66  (08/29/2008)   Triglycerides: 98  (08/29/2008)  Hypertension   Last Blood Pressure: 139 / 88  (12/18/2009)   Serum creatinine: 1.26  (10/30/2009)   BMP action: Ordered   Serum potassium 4.1  (10/30/2009)    Hypertension flowsheet reviewed?: Yes   Progress toward BP goal: At goal  Self-Management Support :   Personal Goals (by the next clinic visit) :      Personal blood pressure goal: 140/90  (04/17/2009)    Patient will work on the following items until the next clinic visit to reach self-care goals:     Medications and monitoring: take my medicines every day  (12/18/2009)     Eating: drink diet soda or water instead of juice or soda, eat more vegetables, use fresh or frozen vegetables, eat foods that are low in salt, eat baked foods instead of fried foods, eat fruit for snacks and desserts  (12/18/2009)     Activity: take a 30 minute walk every day  (12/18/2009)    Hypertension self-management support: Education handout, Resources for patients handout, Written self-care plan  (12/18/2009)   Hypertension self-care plan printed.   Hypertension education handout printed      Resource handout printed.   Process Orders Check Orders Results:     [facility]: Check  successful Tests Sent for requisitioning (December 18, 2009 10:46 AM):     12/18/2009: [facility] -- T-Basic Metabolic Panel [id number] (signed)     12/18/2009: [facility] -- T-Magnesium [id number] (signed)     12/18/2009: [facility] -- T-TSH [phone] (signed)     12/18/2009: [facility] -- T-CK Total [[id number]] (signed)

## 2010-07-01 NOTE — Assessment & Plan Note (Signed)
Summary: EST-CK/FU/MEDS/CFB   Vital Signs:  Patient profile:   60 year old female Height:      64 inches Weight:      127.8 pounds BMI:     22.02 Temp:     97.0 degrees F oral Pulse rate:   63 / minute BP sitting:   141 / 91  (right arm)  Vitals Entered By: Silverio Decamp NT II (April 30, 2010 10:55 AM) CC: ? PAIN MEDICINE/ BILATERAL HAND PAIN Is Patient Diabetic? No Pain Assessment Patient in pain? yes     Location: HANDS Intensity: 5 Type: aching Onset of pain  Intermittent Nutritional Status BMI of 19 -24 = normal  Does patient need assistance? Functional Status Self care Ambulation Normal   Primary Care Provider:  Bertha Stakes MD  CC:  ? PAIN MEDICINE/ BILATERAL HAND PAIN.  History of Present Illness: Patient returns for followup of her hypertension, GERD, renal insufficiency, and other chronic problems. She also reports some worsening of her chronic back pain. She reports that she is compliant with her medications.  Preventive Screening-Counseling & Management  Alcohol-Tobacco     Alcohol type: beer / at times     Smoking Status: current     Smoking Cessation Counseling: yes     Packs/Day: 3-4 cigs per day     Year Started: at age 27     Year Quit: SEPT 16, 2009  Caffeine-Diet-Exercise     Does Patient Exercise: yes     Type of exercise: WALKING     Times/week: 7  Allergies: No Known Drug Allergies  Physical Exam  General:  alert, no distress Lungs:  normal respiratory effort, normal breath sounds, no crackles, and no wheezes.   Heart:  normal rate, regular rhythm, no murmur, no gallop, and no rub.   Abdomen:  soft, non-tender, normal bowel sounds, no hepatomegaly, and no splenomegaly.   Extremities:  no edema Neurologic:  straight leg raise negative bilaterally   Impression & Recommendations:  Problem # 1:  HYPERTENSION (ICD-401.9) Patient's blood pressure is very near goal on her current medication regimen. The plan is to continue current  medications, and reassess blood pressure at next visit.  Her updated medication list for this problem includes:    Norvasc 10 Mg Tabs (Amlodipine besylate) .Marland Kitchen... Take one by mouth daily    Catapres 0.1 Mg Tabs (Clonidine hcl) .Marland Kitchen... Take 1 pill by mouth two times a day.    Lisinopril 10 Mg Tabs (Lisinopril) .Marland Kitchen... Take 1 tablet by mouth once a day  BP today: 141/91 Prior BP: 139/88 (12/18/2009)  Prior 10 Yr Risk Heart Disease: 6 % (10/30/2009)  Labs Reviewed: K+: 4.0 (12/18/2009) Creat: : 1.36 (12/18/2009)   Chol: 173 (08/29/2008)   HDL: 66 (08/29/2008)   LDL: 87 (08/29/2008)   TG: 98 (08/29/2008)  Problem # 2:  GERD (ICD-530.81) Patient's symptoms are controlled on current medication regimen.  Her updated medication list for this problem includes:    Omeprazole 40 Mg Cpdr (Omeprazole) .Marland Kitchen... Take 1 tablet by mouth two times a day    Famotidine 20 Mg Tabs (Famotidine) .Marland Kitchen... Take 1 tablet by mouth once a day at bedtime  Labs Reviewed: Hgb: 12.3 (10/30/2009)   Hct: 38.8 (10/30/2009)  Problem # 3:  RENAL INSUFFICIENCY (ICD-588.9) Patient has mild chronic renal insufficiency. We'll check a metabolic panel today.  Orders: T-CMP with Estimated GFR ([id number])  Problem # 4:  LOW BACK PAIN (ICD-724.2) Patient reports some worsening of her low back pain.  She plans to followup with her back surgeon. I am reluctant to use any narcotic medications, but will try tramadol to see if this provides relief.  Her updated medication list for this problem includes:    Tramadol Hcl 50 Mg Tabs (Tramadol hcl) .Marland Kitchen... Take 1 tablet by mouth every 6 hours as needed for pain  Complete Medication List: 1)  Norvasc 10 Mg Tabs (Amlodipine besylate) .... Take one by mouth daily 2)  Tri-balance Orthotics Womens Misc (Foot care products) .... For heel spur 3)  Catapres 0.1 Mg Tabs (Clonidine hcl) .... Take 1 pill by mouth two times a day. 4)  Omeprazole 40 Mg Cpdr (Omeprazole) .... Take 1 tablet by mouth two  times a day 5)  Lisinopril 10 Mg Tabs (Lisinopril) .... Take 1 tablet by mouth once a day 6)  Flonase 50 Mcg/act Susp (Fluticasone propionate) .... Apply 2 sprays in each nostril at bedtime 7)  Cetirizine Hcl 10 Mg Tabs (Cetirizine hcl) .... Take 1 tablet by mouth once a day 8)  Famotidine 20 Mg Tabs (Famotidine) .... Take 1 tablet by mouth once a day at bedtime 9)  Zolpidem Tartrate 10 Mg Tabs (Zolpidem tartrate) .... Take 1 tablet by mouth at bedtime as needed for insomnia. 10)  Vitamin B Complex Tabs (B complex vitamins) .... Take 1 tablet by mouth once a day 11)  Tramadol Hcl 50 Mg Tabs (Tramadol hcl) .... Take 1 tablet by mouth every 6 hours as needed for pain  Other Orders: Gastroenterology Referral (GI) T-CBC w/Diff [id number])  Patient Instructions: 1)  Please schedule a follow-up appointment in 4 months. 2)  Please keep appointment for screening mammogram. 3)  Start tramadol 50 mg one tablet every 6 hours as needed for pain.  Prescriptions: TRAMADOL HCL 50 MG TABS (TRAMADOL HCL) Take 1 tablet by mouth every 6 hours as needed for pain  #30 x 1   Entered and Authorized by:   Bertha Stakes MD   Signed by:   Bertha Stakes MD on 04/30/2010   Method used:   Electronically to        [facility] (retail)       [phone]       [place], [place]  [address]       Ph: [phone]       Fax: [phone]   RxID:   [id number]    Orders Added: 1)  Gastroenterology Referral [GI] 2)  T-CMP with Estimated GFR [[id number]] 3)  T-CBC w/Diff [id number] 4)  Est. Patient Level III [id number]   Process Orders Check Orders Results:     [facility]: Check successful Tests Sent for requisitioning (May 02, 2010 11:06 AM):     04/30/2010: [facility] -- T-CMP with Estimated GFR [[id number]] (signed)     04/30/2010: [facility] -- T-CBC w/Diff [id number] (signed)     Prevention &  Chronic Care Immunizations   Influenza vaccine: Not documented   Influenza vaccine deferral: Refused  (04/30/2010)   Influenza vaccine due: 01/30/2010    Tetanus booster: Not documented   Td booster deferral: Refused  (10/30/2009)    Pneumococcal vaccine: Not documented  Colorectal Screening   Hemoccult: negative X3  (07/22/2009)   Hemoccult action/deferral: Ordered  (07/17/2009)    Colonoscopy: Not documented   Colonoscopy action/deferral: GI referral  (04/30/2010)  Other Screening   Pap smear: Specimen Adequacy: Satisfactory for evaluation.   Interpretation/Result:Negative for intraepithelial  Lesion or Malignancy.     (11/01/2007)   Pap smear action/deferral: GYN Referral  (10/30/2009)   Pap smear due: 10/2008    Mammogram: BI-RADS CATEGORY 2:  Benign finding(s).^MM DIGITAL DIAGNOSTIC BILAT  (02/22/2009)   Mammogram action/deferral: Deferred  (12/18/2009)   Mammogram due: 02/22/2010   Smoking status: current  (04/30/2010)   Smoking cessation counseling: yes  (04/30/2010)    Screening comments: Has mammogram scheduled for 12/08.  Lipids   Total Cholesterol: 173  (08/29/2008)   LDL: 87  (08/29/2008)   LDL Direct: Not documented   HDL: 66  (08/29/2008)   Triglycerides: 98  (08/29/2008)  Hypertension   Last Blood Pressure: 141 / 91  (04/30/2010)   Serum creatinine: 1.36  (12/18/2009)   BMP action: Ordered   Serum potassium 4.0  (12/18/2009)    Hypertension flowsheet reviewed?: Yes   Progress toward BP goal: Unchanged   Hypertension comments: Did not take medicine today.  Self-Management Support :   Personal Goals (by the next clinic visit) :      Personal blood pressure goal: 140/90  (04/17/2009)   Patient will work on the following items until the next clinic visit to reach self-care goals:     Medications and monitoring: take my medicines every day  (04/30/2010)     Eating: drink diet soda or water instead of juice or soda, eat more vegetables, use fresh or  frozen vegetables, eat foods that are low in salt, eat baked foods instead of fried foods, eat fruit for snacks and desserts  (04/30/2010)     Activity: take a 30 minute walk every day  (04/30/2010)    Hypertension self-management support: Education handout, Written self-care plan  (04/30/2010)   Hypertension self-care plan printed.   Hypertension education handout printed   Nursing Instructions: GI referral for screening colonoscopy (see order)

## 2010-07-01 NOTE — Progress Notes (Signed)
Summary: refill/gg  Phone Note Refill Request  on Oct 08, 2009 10:57 AM  Refills Requested: Medication #1:  CATAPRES 0.1 MG  TABS take 1 pill by mouth two times a day.  Medication #2:  LISINOPRIL 10 MG TABS Take 1 tablet by mouth once a day  Method Requested: Electronic Initial call taken by: Gevena Cotton RN,  Oct 08, 2009 10:58 AM  Follow-up for Phone Call        Refilled electronically.  Follow-up by: Bertha Stakes MD,  Oct 08, 2009 5:40 PM    Prescriptions: LISINOPRIL 10 MG TABS (LISINOPRIL) Take 1 tablet by mouth once a day  #31 x 3   Entered and Authorized by:   Bertha Stakes MD   Signed by:   Bertha Stakes MD on 10/08/2009   Method used:   Electronically to        [facility] (retail)       [phone]       [place], [place]  [address]       Ph: [phone]       Fax: [phone]   RxID:   [id number] CATAPRES 0.1 MG  TABS (CLONIDINE HCL) take 1 pill by mouth two times a day.  #62 x 3   Entered and Authorized by:   Bertha Stakes MD   Signed by:   Bertha Stakes MD on 10/08/2009   Method used:   Electronically to        [facility].* (retail)       [phone]       [place], [place]  [address]       Ph: [phone]       Fax: [phone]   RxID:   [id number]

## 2010-07-01 NOTE — Assessment & Plan Note (Signed)
Summary: EST-CK/FU/MEDS/CFB   Vital Signs:  Patient profile:   60 year old female Height:      64 inches Weight:      129.6 pounds BMI:     22.33 Temp:     97.8 degrees F oral Pulse rate:   68 / minute BP sitting:   115 / 80  (right arm)  Vitals Entered By: Silverio Decamp NT II (July 17, 2009 11:06 AM) CC: CHECK-UP FOLLOWING SURGERY/ NEED REFILLS Is Patient Diabetic? No Pain Assessment Patient in pain? yes     Location: back Intensity: 6 Type: aching   Primary Care Provider:  Bertha Stakes MD  CC:  CHECK-UP FOLLOWING SURGERY/ NEED REFILLS.  History of Present Illness: Patient returns for follow-up of her chronic back pain and right lower extremity pain, hypertension, GERD, chronic cough, and other medical problems.  Today she reports that her cough has completely resolved. She underwent back surgery (lumbar decompressive laminectomy, fusion, and plating) for lumbar spinal stenosis on 05/27/2009 by Dr. Eustace Moore, and reports significant improvement in her low back pain following the surgery.  She reports that she is compliant with her medications. She requested an oral medication to assist with smoking cessation. She also reports currently sleeping following her surgery, and requested a refill of Ambien that was prescribed at the time of her hospital discharge.  Her reflux symptoms have improved following the addition of an H2 blocker to her regimen.  Preventive Screening-Counseling & Management  Alcohol-Tobacco     Alcohol type: beer / at times     Smoking Status: current     Smoking Cessation Counseling: yes     Packs/Day: 3-4 cigs per day     Year Started: at age 42     Year Quit: SEPT 16, 2009  Caffeine-Diet-Exercise     Does Patient Exercise: yes     Type of exercise: WALKING     Times/week: 7  Current Medications (verified): 1)  Norvasc 10 Mg Tabs (Amlodipine Besylate) .... Take One By Mouth Daily 2)  Tri-Balance Orthotics Womens   Misc ([place])  .... For Heel Spur 3)  Catapres 0.1 Mg  Tabs (Clonidine Hcl) .... Take 1 Pill By Mouth Two Times A Day. 4)  Omeprazole 40 Mg Cpdr (Omeprazole) .... Take 1 Tablet By Mouth Two Times A Day 5)  Lisinopril 10 Mg Tabs (Lisinopril) .... Take 1 Tablet By Mouth Once A Day 6)  Flonase 50 Mcg/act Susp (Fluticasone Propionate) .... Apply 2 Sprays in Each Nostril At Bedtime 7)  Cetirizine Hcl 10 Mg Tabs (Cetirizine Hcl) .... Take 1 Tablet By Mouth Once A Day 8)  Famotidine 20 Mg Tabs (Famotidine) .... Take 1 Tablet By Mouth Once A Day At Bedtime  Allergies (verified): No Known Drug Allergies  Past History:  Past Surgical History: S/P lumbar decompressive laminectomy, fusion, and plating for lumbar spinal stenosis on 05/27/2009 by Dr. Eustace Moore  Review of Systems CV:  Denies chest pain or discomfort. Resp:  Denies shortness of breath. GI:  Denies abdominal pain, nausea, and vomiting. GU:  Denies dysuria. Psych:  Denies anxiety and depression.  Physical Exam  General:  alert, no distress Lungs:  normal respiratory effort, normal breath sounds, no crackles, and no wheezes.   Heart:  normal rate, regular rhythm, no murmur, no gallop, and no rub.   Abdomen:  soft, non-tender, normal bowel sounds, no hepatomegaly, and no splenomegaly.   Extremities:  ne edema   Impression & Recommendations:  Problem # 1:  HYPERTENSION (ICD-401.9) Patient's blood pressure is well controlled on current regimen.  Plan is to continue current antihypertensive medications, and follow up blood pressure at next visit.   Her updated medication list for this problem includes:    Norvasc 10 Mg Tabs (Amlodipine besylate) .Marland Kitchen... Take one by mouth daily    Catapres 0.1 Mg Tabs (Clonidine hcl) .Marland Kitchen... Take 1 pill by mouth two times a day.    Lisinopril 10 Mg Tabs (Lisinopril) .Marland Kitchen... Take 1 tablet by mouth once a day  BP today: 115/80 Prior BP: 161/96 (04/17/2009)  Labs Reviewed: K+: 4.4 (01/17/2009) Creat: : 1.29  (01/17/2009)   Chol: 173 (08/29/2008)   HDL: 66 (08/29/2008)   LDL: 87 (08/29/2008)   TG: 98 (08/29/2008)  Orders: T-Basic Metabolic Panel ([id number])  Problem # 2:  COUGH (ICD-786.2) Patient's cough has resolved completely.  Problem # 3:  SPINAL STENOSIS, LUMBAR (ICD-724.02) Patient has done well following back surgery on 05/27/2009, and reports significant improvement in her back pain.  Problem # 4:  TOBACCO ABUSE (ICD-305.1) I discussed the potential side effects of Chantix, and the patient prefers to try that medication rather than nicotine replacement.  Plan is to treat with Chantix as below.  I advised her to call immediately if she has any perceived adverse effects including mood or thought changes, symptoms of depression, or other problems.  The following medications were removed from the medication list:    Nicotine 7 Mg/24hr Pt24 (Nicotine) .Marland Kitchen... Apply 1 patch every 24 hours as directed; do not smoke after patch is applied. Her updated medication list for this problem includes:    Chantix Starting Month Pak 0.5 Mg X 11 & 1 Mg X 42 Tabs (Varenicline tartrate) .Marland Kitchen... Take as directed for smoking cessation  Problem # 5:  GERD (ICD-530.81) Symptoms are controlled on current regimen.  Her updated medication list for this problem includes:    Omeprazole 40 Mg Cpdr (Omeprazole) .Marland Kitchen... Take 1 tablet by mouth two times a day    Famotidine 20 Mg Tabs (Famotidine) .Marland Kitchen... Take 1 tablet by mouth once a day at bedtime  Complete Medication List: 1)  Norvasc 10 Mg Tabs (Amlodipine besylate) .... Take one by mouth daily 2)  Tri-balance Orthotics Womens Misc (Foot care products) .... For heel spur 3)  Catapres 0.1 Mg Tabs (Clonidine hcl) .... Take 1 pill by mouth two times a day. 4)  Omeprazole 40 Mg Cpdr (Omeprazole) .... Take 1 tablet by mouth two times a day 5)  Lisinopril 10 Mg Tabs (Lisinopril) .... Take 1 tablet by mouth once a day 6)  Flonase 50 Mcg/act Susp (Fluticasone propionate)  .... Apply 2 sprays in each nostril at bedtime 7)  Cetirizine Hcl 10 Mg Tabs (Cetirizine hcl) .... Take 1 tablet by mouth once a day 8)  Famotidine 20 Mg Tabs (Famotidine) .... Take 1 tablet by mouth once a day at bedtime 9)  Zolpidem Tartrate 10 Mg Tabs (Zolpidem tartrate) .... Take 1 tablet by mouth at bedtime as needed for insomnia. 10)  Chantix Starting Month Pak 0.5 Mg X 11 & 1 Mg X 42 Tabs (Varenicline tartrate) .... Take as directed for smoking cessation  Other Orders: T-Hemoccult Card-Multiple (take home) [id number])  Patient Instructions: 1)  Please schedule a follow-up appointment in 3 months. 2)  Please complete and return hemoccult cards as instructed. 3)  Take Chantix 0.5 mg daily for 3 days, then 0.5 mg two times a day for four days, then 1 mg  two times a day; stop smoking 1 week after starting Chantix.    Prescriptions: CHANTIX STARTING MONTH PAK 0.5 MG X 11 & 1 MG X 42 TABS (VARENICLINE TARTRATE) Take as directed for smoking cessation  #1 pak x 0   Entered and Authorized by:   Bertha Stakes MD   Signed by:   Bertha Stakes MD on 07/17/2009   Method used:   Electronically to        [facility] (retail)       [phone]       [place], [place]  [address]       Ph: [phone]       Fax: [phone]   RxID:   [id number] ZOLPIDEM TARTRATE 10 MG TABS (ZOLPIDEM TARTRATE) Take 1 tablet by mouth at bedtime as needed for insomnia.  #30 x 2   Entered and Authorized by:   Bertha Stakes MD   Signed by:   Bertha Stakes MD on 07/17/2009   Method used:   Print then Give to Patient   RxID:   [id number]   Prevention & Chronic Care Immunizations   Influenza vaccine: Not documented   Influenza vaccine deferral: Refused  (07/17/2009)    Tetanus booster: Not documented   Td booster deferral: Refused  (07/17/2009)    Pneumococcal vaccine: Not documented  Colorectal Screening   Hemoccult: Not documented   Hemoccult  action/deferral: Ordered  (07/17/2009)    Colonoscopy: Not documented  Other Screening   Pap smear: Specimen Adequacy: Satisfactory for evaluation.   Interpretation/Result:Negative for intraepithelial Lesion or Malignancy.     (11/01/2007)   Pap smear action/deferral: Deferred  (04/17/2009)   Pap smear due: 10/2008    Mammogram: BI-RADS CATEGORY 2:  Benign finding(s).^MM DIGITAL DIAGNOSTIC BILAT  (02/22/2009)   Mammogram action/deferral: Repeat in 2 months.   (09/12/2007)   Mammogram due: 02/22/2010  Reports requested:   Last colonoscopy report requested.  Smoking status: current  (07/17/2009)   Smoking cessation counseling: yes  (07/17/2009)  Lipids   Total Cholesterol: 173  (08/29/2008)   LDL: 87  (08/29/2008)   LDL Direct: Not documented   HDL: 66  (08/29/2008)   Triglycerides: 98  (08/29/2008)  Hypertension   Last Blood Pressure: 115 / 80  (07/17/2009)   Serum creatinine: 1.29  (01/17/2009)   BMP action: Ordered   Serum potassium 4.4  (01/17/2009)    Hypertension flowsheet reviewed?: Yes   Progress toward BP goal: At goal  Self-Management Support :   Personal Goals (by the next clinic visit) :      Personal blood pressure goal: 140/90  (04/17/2009)   Patient will work on the following items until the next clinic visit to reach self-care goals:     Medications and monitoring: take my medicines every day  (07/17/2009)     Eating: drink diet soda or water instead of juice or soda, eat more vegetables, use fresh or frozen vegetables, eat foods that are low in salt, eat baked foods instead of fried foods  (07/17/2009)     Activity: take a 30 minute walk every day  (07/17/2009)    Hypertension self-management support: Written self-care plan  (07/17/2009)   Hypertension self-care plan printed.   Nursing Instructions: Provide Hemoccult cards with instructions (see order) Request report of last colonoscopy    Process Orders Check Orders Results:     [facility]: Check successful Tests Sent for requisitioning (July 19, 2009 6:32 PM):     07/17/2009: [facility] -- T-Basic Metabolic Panel 0000000 (signed)

## 2010-07-02 ENCOUNTER — Encounter: Payer: Self-pay | Admitting: Neurological Surgery

## 2010-08-11 ENCOUNTER — Other Ambulatory Visit: Payer: Self-pay | Admitting: *Deleted

## 2010-08-11 MED ORDER — ZOLPIDEM TARTRATE 10 MG PO TABS: 10.0000 mg | ORAL_TABLET | Freq: Every evening | ORAL | Status: DC | PRN

## 2010-08-11 NOTE — Telephone Encounter (Signed)
Refill approved - nurse to complete. 

## 2010-08-11 NOTE — Telephone Encounter (Signed)
Refill for Ambien called to the [facility] on [address] per order of Dr. Marinda Elk.

## 2010-08-15 ENCOUNTER — Encounter: Payer: Medicare Other | Attending: Physical Medicine and Rehabilitation

## 2010-08-15 ENCOUNTER — Ambulatory Visit: Payer: Medicare Other | Admitting: Physical Medicine and Rehabilitation

## 2010-08-15 DIAGNOSIS — M545 Low back pain, unspecified: Secondary | ICD-10-CM | POA: Insufficient documentation

## 2010-08-15 DIAGNOSIS — Z79899 Other long term (current) drug therapy: Secondary | ICD-10-CM | POA: Insufficient documentation

## 2010-08-15 DIAGNOSIS — G894 Chronic pain syndrome: Secondary | ICD-10-CM

## 2010-08-15 DIAGNOSIS — Z981 Arthrodesis status: Secondary | ICD-10-CM | POA: Insufficient documentation

## 2010-08-15 DIAGNOSIS — F17200 Nicotine dependence, unspecified, uncomplicated: Secondary | ICD-10-CM | POA: Insufficient documentation

## 2010-08-15 DIAGNOSIS — M51379 Other intervertebral disc degeneration, lumbosacral region without mention of lumbar back pain or lower extremity pain: Secondary | ICD-10-CM | POA: Insufficient documentation

## 2010-08-15 DIAGNOSIS — M79609 Pain in unspecified limb: Secondary | ICD-10-CM | POA: Insufficient documentation

## 2010-08-15 DIAGNOSIS — M5137 Other intervertebral disc degeneration, lumbosacral region: Secondary | ICD-10-CM | POA: Insufficient documentation

## 2010-08-15 DIAGNOSIS — I10 Essential (primary) hypertension: Secondary | ICD-10-CM | POA: Insufficient documentation

## 2010-09-01 LAB — DIFFERENTIAL
Basophils Relative: 1 % (ref 0–1)
Eosinophils Absolute: 0.2 10*3/uL (ref 0.0–0.7)
Eosinophils Relative: 4 % (ref 0–5)
Lymphs Abs: 1.5 10*3/uL (ref 0.7–4.0)
Monocytes Absolute: 0.3 10*3/uL (ref 0.1–1.0)
Monocytes Relative: 6 % (ref 3–12)

## 2010-09-01 LAB — CARDIAC PANEL(CRET KIN+CKTOT+MB+TROPI)
CK, MB: 1.4 ng/mL (ref 0.3–4.0)
Total CK: 157 U/L (ref 7–177)
Troponin I: 0.02 ng/mL (ref 0.00–0.06)

## 2010-09-01 LAB — PROTIME-INR: Prothrombin Time: 11.9 seconds (ref 11.6–15.2)

## 2010-09-01 LAB — HEPATIC FUNCTION PANEL
Albumin: 3.7 g/dL (ref 3.5–5.2)
Alkaline Phosphatase: 103 U/L (ref 39–117)
Bilirubin, Direct: 0.1 mg/dL (ref 0.0–0.3)
Total Bilirubin: 0.5 mg/dL (ref 0.3–1.2)

## 2010-09-01 LAB — CBC
Platelets: 349 10*3/uL (ref 150–400)
WBC: 5.1 10*3/uL (ref 4.0–10.5)

## 2010-09-01 LAB — BASIC METABOLIC PANEL
BUN: 18 mg/dL (ref 6–23)
Creatinine, Ser: 1.23 mg/dL — ABNORMAL HIGH (ref 0.4–1.2)
GFR calc non Af Amer: 45 mL/min — ABNORMAL LOW (ref >60)

## 2010-09-01 LAB — TYPE AND SCREEN: Antibody Screen: NEGATIVE

## 2010-09-19 ENCOUNTER — Encounter
Payer: Medicare Other | Attending: Physical Medicine and Rehabilitation | Admitting: Physical Medicine and Rehabilitation

## 2010-10-17 NOTE — Op Note (Signed)
   NAME:  Monique Henderson, Monique Henderson                      ACCOUNT NO.:  [id number]   MEDICAL RECORD NO.:  [id number]                   PATIENT TYPE:  AMB   LOCATION:  [facility]                                  FACILITY:  [facility]   PHYSICIAN:  Rudell Cobb. Annamaria Boots, M.D.                DATE OF BIRTH:  29-Jan-1951   DATE OF PROCEDURE:  02/26/2003  DATE OF DISCHARGE:                                 OPERATIVE REPORT   PREOPERATIVE DIAGNOSIS:  Ductal carcinoma in situ of the left breast.   POSTOPERATIVE DIAGNOSIS:  Ductal carcinoma in situ of the left breast.   OPERATION PERFORMED:  Left partial mastectomy with needle localization and  specimen mammography.   SURGEON:  Rudell Cobb. Annamaria Boots, M.D.   ANESTHESIA:  General.   DESCRIPTION OF PROCEDURE:  After suitable general anesthesia was induced,  the patient was placed in supine with both arms extended on the arm boards  and the left breast prepped and draped in the usual fashion.  A curvilinear  incision centered at about the 7 o'clock position was then outlined around  the previously placed localization wire.  The area was then infiltrated with  a mixture of 1% Xylocaine and 0.25% Marcaine.  An incision was then made and  a wide excision of the wire and surrounding tissue was carried out.  Hemostasis was obtained with cautery.  Specimen mammography confirmed the  removal of the calcifications.  Subcuticular closure with 4-0 Monocryl and  Steri-Strips carried out.  Dressing applied.  The patient was then  transferred to the recovery room in satisfactory condition having tolerated  the procedure well.                                                Rudell Cobb. Annamaria Boots, M.D.    PRY/MEDQ  D:  02/26/2003  T:  02/26/2003  Job:  [id number]

## 2010-10-17 NOTE — Op Note (Signed)
NAME:  Monique Henderson, Monique Henderson                      ACCOUNT NO.:  [id number]   MEDICAL RECORD NO.:  [id number]                   PATIENT TYPE:  AMB   LOCATION:  [facility]                                  FACILITY:  [facility]   PHYSICIAN:  Rudell Cobb. Annamaria Boots, M.D.                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   POSTOPERATIVE DIAGNOSIS:  Intraductal carcinoma of the left breast with  close margins.   OPERATION:  Re-excision of cranial and lateral margins.   SURGEON:  Rudell Cobb. Annamaria Boots, M.D.   ANESTHESIA:  General.   OPERATIVE PROCEDURE:  After suitable general anesthesia was induced, the  patient was placed in supine position and the left breast prepped and draped  in the usual fashion.  The old incision from about the 8 to the 5 o'clock  position was then opened and we entered the lumpectomy cavity where she had  a hematoma.  I evacuated the hematoma and then irrigated that out.   I then excised the lateral margin and then with a separate piece excised the  cranial margin both to get wider margins.  These were oriented for the  pathologist with methylene blue.  With good hemostasis, we irrigated this  wound thoroughly and then infiltrated with 0.25% Marcaine.  Closure was with  3-0 Vicryl and subcuticular 4-0 Monocryl and Steri-Strips.  Dressing  applied.  The patient transferred to the recovery room in satisfactory  condition having tolerated the procedure well.                                               Rudell Cobb. Annamaria Boots, M.D.    PRY/MEDQ  D:  04/19/2003  T:  04/20/2003  Job:  [id number]

## 2010-10-22 ENCOUNTER — Encounter
Payer: Medicare Other | Attending: Physical Medicine and Rehabilitation | Admitting: Physical Medicine and Rehabilitation

## 2010-10-22 DIAGNOSIS — M545 Low back pain, unspecified: Secondary | ICD-10-CM | POA: Insufficient documentation

## 2010-10-22 DIAGNOSIS — G894 Chronic pain syndrome: Secondary | ICD-10-CM

## 2010-10-22 DIAGNOSIS — Z981 Arthrodesis status: Secondary | ICD-10-CM | POA: Insufficient documentation

## 2010-10-22 DIAGNOSIS — G8929 Other chronic pain: Secondary | ICD-10-CM | POA: Insufficient documentation

## 2010-10-23 NOTE — Assessment & Plan Note (Signed)
Monique Henderson is a 60 year old woman who was last seen by me on August 15, 2010.  She missed an appointment in April.  She called in for pain medications on Oct 01, 2010, and was informed that this is the one-time refill only.  She was called in 90 hydrocodone 5/500, since she missed her appointment on September 19, 2010.  [facility].  She has a history of a L4-5 decompression and fusion using an Affix plate per Dr. Sherley Bounds, on May 27, 2009.  She has returned to clinic for another refill of her pain medications.  She complains of pain in the low back radiating slightly to the left. Average pain about 8 on a scale of 10, described as dull.  Sleep is fair.  Pain is worse with walking and sitting, improves with heat or ice.  Reports fair relief with current meds.  FUNCTIONAL STATUS:  She can walk about an hour or little less.  She is able to climb stairs.  Sometimes she does drive.  She is independent with self-care.  Denies problems with controlling bowel or bladder. Denies depression, anxiety, or suicidal ideation.  Reports occasional night sweats  with sleep.  She is to follow up with primary care for this.  Past medical and surgical history unchanged from last visit.  She has a history of breast cancer on the left 2002, some left foot surgery in 2005, and lumbar spine surgery, May 27, 2009, Dr. Ronnald Ramp.  No changes in family history.  She is a smoker, cautioned against this.  PHYSICAL EXAMINATION:  VITAL SIGNS:  Blood pressure is 148/84, pulse 79, respirations 18, 98% saturated on room air. GENERAL:  She is a well-developed thin female who does not appear in any distress.  She is oriented x3.  Speech is clear.  Affect is bright.  She is alert, cooperative, and pleasant.  Follows commands without difficulty, answers my questions appropriately. NEUROLOGIC:  Cranial nerves and coordination are intact.  Her reflexes are 2+  at the patellar and Achilles tendon.  No abnormal tone, clonus, or tremors are noted. MUSCULOSKELETAL:  Motor strength is good in both lower extremities, 5/5 at hip flexors, knee extensors, dorsiflexors, plantar flexors, EHL.  She transitions easily from sitting to standing.  Gait in the room is non- antalgic.  She has some limitations in forward flexion at end range as well as extension.  She reports no numbness in the lower extremities. Straight leg raise is negative.  IMPRESSION: 1. Lumbago. 2. Status post right L4-5 decompression and fusion using Affix plate,     May 27, 2009, Dr. Ronnald Ramp, with residual lower extremity     neuropathic pain.  PLAN:  The patient did not bring her medications in for pill counts today.  Her fill date was Oct 01, 2010.  I have asked her to follow back up in clinic either today or tomorrow with her pill bottles and leftover pills for pill count and then we will refill her pain medications.  I have also obtained urine drug screen today and requested a farm check as well.  Follow up in 1 month.     Franchot Gallo, M.D.    DMK/MedQ D:  10/22/2010 14:17:05  T:  10/23/2010 [id number]  Job #:  [id number]
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
## 2010-11-17 ENCOUNTER — Encounter: Payer: Medicare Other | Admitting: Neurosurgery

## 2010-12-11 ENCOUNTER — Other Ambulatory Visit: Payer: Self-pay | Admitting: *Deleted

## 2010-12-11 MED ORDER — TRAMADOL HCL 50 MG PO TABS: 50.0000 mg | ORAL_TABLET | Freq: Four times a day (QID) | ORAL | Status: DC | PRN

## 2010-12-11 MED ORDER — AMLODIPINE BESYLATE 10 MG PO TABS: 10.0000 mg | ORAL_TABLET | Freq: Every day | ORAL | Status: DC

## 2010-12-11 MED ORDER — CLONIDINE HCL 0.1 MG PO TABS: 0.1000 mg | ORAL_TABLET | Freq: Two times a day (BID) | ORAL | Status: DC

## 2010-12-11 MED ORDER — FAMOTIDINE 20 MG PO TABS: 20.0000 mg | ORAL_TABLET | Freq: Every day | ORAL | Status: DC

## 2010-12-11 MED ORDER — ZOLPIDEM TARTRATE 10 MG PO TABS: 10.0000 mg | ORAL_TABLET | Freq: Every evening | ORAL | Status: DC | PRN

## 2010-12-11 NOTE — Telephone Encounter (Signed)
Last seen Nov and was to F/U in 4 months. Mo appt since then. Sent to front desk to sch

## 2011-01-26 ENCOUNTER — Other Ambulatory Visit: Payer: Self-pay | Admitting: Internal Medicine

## 2011-01-27 NOTE — Telephone Encounter (Signed)
Please remind patient to keep her upcoming appointment.

## 2011-01-28 ENCOUNTER — Encounter: Payer: Medicare Other | Admitting: Internal Medicine

## 2011-02-12 ENCOUNTER — Other Ambulatory Visit: Payer: Self-pay | Admitting: Internal Medicine

## 2011-02-25 ENCOUNTER — Encounter: Payer: Self-pay | Admitting: Internal Medicine

## 2011-02-25 ENCOUNTER — Ambulatory Visit (INDEPENDENT_AMBULATORY_CARE_PROVIDER_SITE_OTHER): Payer: Medicare Other | Admitting: Internal Medicine

## 2011-02-25 DIAGNOSIS — I10 Essential (primary) hypertension: Secondary | ICD-10-CM

## 2011-02-25 DIAGNOSIS — M545 Low back pain: Secondary | ICD-10-CM

## 2011-02-25 DIAGNOSIS — N259 Disorder resulting from impaired renal tubular function, unspecified: Secondary | ICD-10-CM

## 2011-02-25 DIAGNOSIS — M48061 Spinal stenosis, lumbar region without neurogenic claudication: Secondary | ICD-10-CM

## 2011-02-25 DIAGNOSIS — Z853 Personal history of malignant neoplasm of breast: Secondary | ICD-10-CM

## 2011-02-25 DIAGNOSIS — K219 Gastro-esophageal reflux disease without esophagitis: Secondary | ICD-10-CM

## 2011-02-25 DIAGNOSIS — Z13220 Encounter for screening for lipoid disorders: Secondary | ICD-10-CM

## 2011-02-25 DIAGNOSIS — Z124 Encounter for screening for malignant neoplasm of cervix: Secondary | ICD-10-CM

## 2011-02-25 DIAGNOSIS — F17200 Nicotine dependence, unspecified, uncomplicated: Secondary | ICD-10-CM

## 2011-02-25 LAB — COMPLETE METABOLIC PANEL WITH GFR
AST: 39 U/L — ABNORMAL HIGH (ref 0–37)
Alkaline Phosphatase: 75 U/L (ref 39–117)
BUN: 21 mg/dL (ref 6–23)
GFR, Est Non African American: 38 mL/min — ABNORMAL LOW (ref >60)
Glucose, Bld: 107 mg/dL — ABNORMAL HIGH (ref 70–99)
Sodium: 140 mEq/L (ref 135–145)
Total Bilirubin: 0.3 mg/dL (ref 0.3–1.2)
Total Protein: 6.6 g/dL (ref 6.0–8.3)

## 2011-02-25 LAB — CBC WITH DIFFERENTIAL/PLATELET
HCT: 38.5 % (ref 36.0–46.0)
Hemoglobin: 12.4 g/dL (ref 12.0–15.0)
Lymphocytes Relative: 44 % (ref 12–46)
Monocytes Absolute: 0.4 10*3/uL (ref 0.1–1.0)
Monocytes Relative: 8 % (ref 3–12)
Neutro Abs: 1.8 10*3/uL (ref 1.7–7.7)
WBC: 4.4 10*3/uL (ref 4.0–10.5)

## 2011-02-25 LAB — LIPID PANEL
HDL: 59 mg/dL (ref >39)
LDL Cholesterol: 69 mg/dL (ref 0–99)
Total CHOL/HDL Ratio: 2.4 Ratio
Triglycerides: 68 mg/dL (ref <150)
VLDL: 14 mg/dL (ref 0–40)

## 2011-02-25 MED ORDER — NICOTINE 14 MG/24HR TD PT24: 1.0000 | MEDICATED_PATCH | TRANSDERMAL | Status: AC

## 2011-02-25 MED ORDER — OMEPRAZOLE 40 MG PO CPDR: 40.0000 mg | DELAYED_RELEASE_CAPSULE | Freq: Two times a day (BID) | ORAL | Status: DC

## 2011-02-25 MED ORDER — AMLODIPINE BESYLATE 10 MG PO TABS: 10.0000 mg | ORAL_TABLET | Freq: Every day | ORAL | Status: DC

## 2011-02-25 MED ORDER — FLUTICASONE PROPIONATE 50 MCG/ACT NA SUSP: 2.0000 | Freq: Every day | NASAL | Status: DC

## 2011-02-25 MED ORDER — FAMOTIDINE 20 MG PO TABS: 20.0000 mg | ORAL_TABLET | Freq: Every day | ORAL | Status: DC

## 2011-02-25 MED ORDER — ZOLPIDEM TARTRATE 10 MG PO TABS: 10.0000 mg | ORAL_TABLET | Freq: Every evening | ORAL | Status: DC | PRN

## 2011-02-25 MED ORDER — CETIRIZINE HCL 10 MG PO TABS: 10.0000 mg | ORAL_TABLET | Freq: Every day | ORAL | Status: DC

## 2011-02-25 NOTE — Patient Instructions (Signed)
Stop smoking. Use nicotine patches as prescribed to assist with smoking cessation.

## 2011-02-25 NOTE — Assessment & Plan Note (Signed)
Lab Results  Component Value Date   NA 137 12/18/2009   K 4.0 12/18/2009   CL 98 12/18/2009   CO2 29 12/18/2009   BUN 20 12/18/2009   CREATININE 1.36* 12/18/2009    BP Readings from Last 3 Encounters:  02/25/11 138/86  04/30/10 141/91  12/18/09 139/88    Assessment: Hypertension control:  controlled  Progress toward goals:  at goal Barriers to meeting goals:  no barriers identified  Plan: Hypertension treatment:  continue current medications will check a metabolic panel today.

## 2011-02-25 NOTE — Progress Notes (Signed)
  Subjective:    Patient ID: Monique Henderson, female    DOB: 11/20/1950, 60 y.o.   MRN: [id number]  HPI Patient presents for followup of her hypertension, GERD, renal insufficiency, and other chronic medical problems.  She reports that she has been doing well, without any acute complaints.  She occasionally has leg cramps which are self-limited; she also has chronic left low back and thigh pain but no recent increase in her symptoms, which date back to her prior back surgery.  Her pain is reasonably well-controlled on tramadol.  Her reflux symptoms are controlled on her current regimen.  She reports that she is compliant with her medications.  She does report some postnasal drip, and has not recently been taking her cetirizine.  Review of Systems  Constitutional: Negative for fever, chills, diaphoresis and appetite change.  Respiratory: Positive for cough (Patient reports some cough when lying down which she attributes to postnasal drip.). Negative for shortness of breath.   Cardiovascular: Negative for chest pain and leg swelling.  Gastrointestinal: Negative for nausea, vomiting, abdominal pain and blood in stool.  Genitourinary: Negative for dysuria.  Musculoskeletal: Positive for back pain (Chronic stable low back pain).      Objective:   Physical Exam  Constitutional: No distress.  Cardiovascular: Normal rate and regular rhythm.  Exam reveals no gallop and no friction rub.   No murmur heard. Pulmonary/Chest: Effort normal and breath sounds normal. No respiratory distress. She has no wheezes. She has no rales.  Abdominal: Soft. Bowel sounds are normal. There is no hepatosplenomegaly. There is no tenderness. There is no rebound.  Musculoskeletal: She exhibits no edema.  Neurological:       Straight leg raise negative bilaterally.      Assessment & Plan:

## 2011-02-25 NOTE — Assessment & Plan Note (Signed)
Lab Results  Component Value Date   CREATININE 1.36* 12/18/2009   CREATININE 1.26* 10/30/2009   CREATININE 1.56* 07/17/2009   Plan is to check a metabolic panel today.

## 2011-02-25 NOTE — Assessment & Plan Note (Signed)
Patient's symptoms are well-controlled on omeprazole and famotidine.  Plan is to continue current regimen.

## 2011-02-25 NOTE — Assessment & Plan Note (Signed)
Patient has stable chronic low back pain that is recently well controlled on tramadol.  Plan is to continue at current dose.

## 2011-02-25 NOTE — Assessment & Plan Note (Signed)
Patient expressed interest in stopping smoking, and agreed to try nicotine patches.  I encouraged her to see our smoking cessation counselor, but she declined.

## 2011-03-06 ENCOUNTER — Other Ambulatory Visit: Payer: Self-pay | Admitting: Internal Medicine

## 2011-03-09 ENCOUNTER — Other Ambulatory Visit: Payer: Self-pay | Admitting: *Deleted

## 2011-03-09 MED ORDER — ZOLPIDEM TARTRATE 10 MG PO TABS: 10.0000 mg | ORAL_TABLET | Freq: Every evening | ORAL | Status: DC | PRN

## 2011-03-09 NOTE — Telephone Encounter (Signed)
Refill called to [facility] for Ambien.

## 2011-04-07 ENCOUNTER — Other Ambulatory Visit: Payer: Self-pay | Admitting: Neurological Surgery

## 2011-04-07 DIAGNOSIS — M545 Low back pain: Secondary | ICD-10-CM

## 2011-04-08 ENCOUNTER — Ambulatory Visit (INDEPENDENT_AMBULATORY_CARE_PROVIDER_SITE_OTHER): Payer: Medicare Other | Admitting: Obstetrics & Gynecology

## 2011-04-08 ENCOUNTER — Other Ambulatory Visit (HOSPITAL_COMMUNITY)
Admission: RE | Admit: 2011-04-08 | Discharge: 2011-04-08 | Disposition: A | Discharge status: Home / Self Care | Payer: Medicare Other | Source: Ambulatory Visit | Attending: Obstetrics & Gynecology | Admitting: Obstetrics & Gynecology

## 2011-04-08 ENCOUNTER — Encounter: Payer: Self-pay | Admitting: Obstetrics and Gynecology

## 2011-04-08 VITALS — BP 166/105 | HR 78 | Temp 99.0°F | Ht 63.0 in | Wt 128.9 lb

## 2011-04-08 DIAGNOSIS — Z124 Encounter for screening for malignant neoplasm of cervix: Secondary | ICD-10-CM | POA: Insufficient documentation

## 2011-04-08 DIAGNOSIS — Z01419 Encounter for gynecological examination (general) (routine) without abnormal findings: Secondary | ICD-10-CM

## 2011-04-08 NOTE — Patient Instructions (Signed)
Preventative Care for Adults, Female A healthy lifestyle and preventative care can promote health and wellness. Preventative health guidelines for women include the following key practices:  A routine yearly physical is a good way to check with your caregiver about your health and preventative screening. It is a chance to share any concerns and updates on your health, and to receive a thorough exam.   Visit your dentist for a routine exam and preventative care every 6 months. Brush your teeth twice a day and floss once a day. Good oral hygiene prevents tooth decay and gum disease.   The frequency of eye exams is based on your age, health, family medical history, use of contact lenses, and other factors. Follow your caregiver's recommendations for frequency of eye exams.   Eat a healthy diet. Foods like vegetables, fruits, whole grains, low-fat dairy products, and lean protein foods contain the nutrients you need without too many calories. Decrease your intake of foods high in solid fats, added sugars, and salt. Eat the right amount of calories for you.Get information about a proper diet from your caregiver, if necessary.   Regular physical exercise is one of the most important things you can do for your health. Most adults should get at least 150 minutes of moderate-intensity exercise (any activity that increases your heart rate and causes you to sweat) each week. In addition, most adults need muscle-strengthening exercises on 2 or more days a week.   Maintain a healthy weight. The body mass index (BMI) is a screening tool to identify possible weight problems. It provides an estimate of body fat based on height and weight. Your caregiver can help determine your BMI, and can help you achieve or maintain a healthy weight.For adults 20 years and older:   A BMI below 18.5 is considered underweight.   A BMI of 18.5 to 24.9 is normal.   A BMI of 25 to 29.9 is considered overweight.   A BMI of 30 and  above is considered obese.   Maintain normal blood lipids and cholesterol levels by exercising and minimizing your intake of saturated fat. Eat a balanced diet with plenty of fruit and vegetables. Blood tests for lipids and cholesterol should begin at age 20 and be repeated every 5 years. If your lipid or cholesterol levels are high, you are over 50, or you are a high risk for heart disease, you may need your cholesterol levels checked more frequently.Ongoing high lipid and cholesterol levels should be treated with medicines if diet and exercise are not effective.   If you smoke, find out from your caregiver how to quit. If you do not use tobacco, do not start.   If you are pregnant, do not drink alcohol. If you are breastfeeding, be very cautious about drinking alcohol. If you are not pregnant and choose to drink alcohol, do not exceed 1 drink per day. One drink is considered to be 12 ounces (355 mL) of beer, 5 ounces (148 mL) of wine, or 1.5 ounces (44 mL) of liquor.   Avoid use of street drugs. Do not share needles with anyone. Ask for help if you need support or instructions about stopping the use of drugs.   High blood pressure causes heart disease and increases the risk of stroke. Your blood pressure should be checked at least every 1 to 2 years. Ongoing high blood pressure should be treated with medicines if weight loss and exercise are not effective.   If you are 55 to 58   years old, ask your caregiver if you should take aspirin to prevent strokes.   Diabetes screening involves taking a blood sample to check your fasting blood sugar level. This should be done once every 3 years, after age 45, if you are within normal weight and without risk factors for diabetes. Testing should be considered at a younger age or be carried out more frequently if you are overweight and have at least 1 risk factor for diabetes.   Breast cancer screening is essential preventative care for women. You should  practice "breast self-awareness." This means understanding the normal appearance and feel of your breasts and may include breast self-examination. Any changes detected, no matter how small, should be reported to a caregiver. Women in their 20s and 30s should have a clinical breast exam (CBE) by a caregiver as part of a regular health exam every 1 to 3 years. After age 40, women should have a CBE every year. Starting at age 40, women should consider having a mammogram (breast X-ray) every year. Women who have a family history of breast cancer should talk to their caregiver about genetic screening. Women at a high risk of breast cancer should talk to their caregiver about having an MRI and a mammogram every year.   The Pap test is a screening test for cervical cancer. A Pap test can show cell changes on the cervix that might become cervical cancer if left untreated. A Pap test is a procedure in which cells are obtained and examined from the lower end of the uterus (cervix).   Women should have a Pap test starting at age 21.   Between ages 21 and 29, Pap tests should be repeated every 2 years.   Beginning at age 30, you should have a Pap test every 3 years as long as the past 3 Pap tests have been normal.   Some women have medical problems that increase the chance of getting cervical cancer. Talk to your caregiver about these problems. It is especially important to talk to your caregiver if a new problem develops soon after your last Pap test. In these cases, your caregiver may recommend more frequent screening and Pap tests.   The above recommendations are the same for women who have or have not gotten the vaccine for human papillomavirus (HPV).   If you had a hysterectomy for a problem that was not cancer or a condition that could lead to cancer, then you no longer need Pap tests. Even if you no longer need a Pap test, a regular exam is a good idea to make sure no other problems are starting.   If you  are between ages 65 and 70, and you have had normal Pap tests going back 10 years, you no longer need Pap tests. Even if you no longer need a Pap test, a regular exam is a good idea to make sure no other problems are starting.   If you have had past treatment for cervical cancer or a condition that could lead to cancer, you need Pap tests and screening for cancer for at least 20 years after your treatment.   If Pap tests have been discontinued, risk factors (such as a new sexual partner) need to be reassessed to determine if screening should be resumed.   The HPV test is an additional test that may be used for cervical cancer screening. The HPV test looks for the virus that can cause the cell changes on the cervix. The cells collected   during the Pap test can be tested for HPV. The HPV test could be used to screen women aged 30 years and older, and should be used in women of any age who have unclear Pap test results. After the age of 30, women should have HPV testing at the same frequency as a Pap test.   Colorectal cancer can be detected and often prevented. Most routine colorectal cancer screening begins at the age of 50 and continues through age 75. However, your caregiver may recommend screening at an earlier age if you have risk factors for colon cancer. On a yearly basis, your caregiver may provide home test kits to check for hidden blood in the stool. Use of a small camera at the end of a tube, to directly examine the colon (sigmoidoscopy or colonoscopy), can detect the earliest forms of colorectal cancer. Talk to your caregiver about this at age 50, when routine screening begins. Direct examination of the colon should be repeated every 5 to 10 years through age 75, unless early forms of pre-cancerous polyps or small growths are found.   Practice safe sex. Use condoms and avoid high-risk sexual practices to reduce the spread of sexually transmitted infections (STIs). STIs include gonorrhea,  chlamydia, syphilis, trichomonas, herpes, HPV, and human immunodeficiency virus (HIV). Herpes, HIV, and HPV are viral illnesses that have no cure. They can result in disability, cancer, and death. Sexually active women aged 25 and younger should be checked for Chlamydia. Older women with new or multiple partners should also be tested for Chlamydia. Testing for other STIs is recommended if you are sexually active and at increased risk.   Osteoporosis is a disease in which the bones lose minerals and strength with aging. This can result in serious bone fractures. The risk of osteoporosis can be identified using a bone density scan. Women ages 65 and over and women at risk for fractures or osteoporosis should discuss screening with their caregivers. Ask your caregiver whether you should take a calcium supplement or vitamin D to reduce the rate of osteoporosis.   Menopause can be associated with physical symptoms and risks. Hormone replacement therapy is available to decrease symptoms and risks. You should talk to your caregiver about whether hormone replacement therapy is right for you.   Use sunscreen with skin protection factor (SPF) of 30 or more. Apply sunscreen liberally and repeatedly throughout the day. You should seek shade when your shadow is shorter than you. Protect yourself by wearing long sleeves, pants, a wide-brimmed hat, and sunglasses year round, whenever you are outdoors.   Once a month, do a whole body skin exam, using a mirror to look at the skin on your back. Notify your caregiver of new moles, moles that have irregular borders, moles that are larger than a pencil eraser, or moles that have changed in shape or color.   Stay current with required immunizations.   Influenza. You need a dose every fall (or winter). The composition of the flu vaccine changes each year, so being vaccinated once is not enough.   Pneumococcal polysaccharide. You need 1 to 2 doses if you smoke cigarettes or  if you have certain chronic medical conditions. You need 1 dose at age 65 (or older) if you have never been vaccinated.   Tetanus, diphtheria, pertussis (Tdap, Td). Get 1 dose of Tdap vaccine if you are younger than age 65 years, are over 65 and have contact with an infant, are a healthcare worker, are pregnant, or simply want   to be protected from whooping cough. After that, you need a Td booster dose every 10 years. Consult your caregiver if you have not had at least 3 tetanus and diphtheria-containing shots sometime in your life or have a deep or dirty wound.   HPV. You need this vaccine if you are a woman age 26 years or younger. The vaccine is given in 3 doses over 6 months.   Measles, mumps, rubella (MMR). You need at least 1 dose of MMR if you were born in 1957 or later. You may also need a 2nd dose.   Meningococcal. If you are age 19 to 21 years and a first-year college student living in a residence hall, or have one of several medical conditions, you need to get vaccinated against meningococcal disease. You may also need additional booster doses.   Zoster (shingles). If you are age 60 years or older, you should get this vaccine.   Varicella (chickenpox). If you have never had chickenpox or you were vaccinated but received only 1 dose, talk to your caregiver to find out if you need this vaccine.   Hepatitis A. You need this vaccine if you have a specific risk factor for hepatitis A virus infection or you simply wish to be protected from this disease. The vaccine is usually given as 2 doses, 6 to 18 months apart.   Hepatitis B. You need this vaccine if you have a specific risk factor for hepatitis B virus infection or you simply wish to be protected from this disease. The vaccine is given in 3 doses, usually over 6 months.  Preventative Services / Frequency Ages 19 to 39  Blood pressure check.** / Every 1 to 2 years.   Lipid and cholesterol check.**/ Every 5 years beginning at age 20.    Clinical breast exam.** / Every 3 years for women in their 20s and 30s.   Pap Test.** / Every 2 years from ages 21 through 29. Every 3 years starting at age 30 years through age 65 or 70 with a history of 3 consecutive normal Pap tests.   HPV Screening.** / Every 3 years from ages 30 through ages 65 to 70 with a history of 3 consecutive normal Pap tests.   Skin self-exam. / Monthly.   Influenza immunization.** / Every year.   Pneumococcal polysaccharide immunization.** / 1 to 2 doses if you smoke cigarettes or if you have certain chronic medical conditions.   Tetanus, diphtheria, pertussis (Tdap,Td) immunization. / A one-time dose of Tdap vaccine. After that, you need a Td booster dose every 10 years.   HPV immunization. / 3 doses over 6 months, if 26 and younger.   Measles, mumps, rubella (MMR) immunization. / You need at least 1 dose of MMR if you were born in 1957 or later. You may also need a 2nd dose.   Meningococcal immunization. / 1 dose if you are age 19 to 21 years and a first-year college student living in a residence hall, or have one of several medical conditions, you need to get vaccinated against meningococcal disease. You may also need additional booster doses.   Varicella immunization. **/ Consult your caregiver.   Hepatitis A immunization. ** / Consult your caregiver. 2 doses, 6 to 18 months apart.   Hepatitis B immunization.** / Consult your caregiver. 3 doses usually over 6 months.  Ages 40 to 64  Blood pressure check.** / Every 1 to 2 years.   Lipid and cholesterol check.**/ Every 5 years beginning   at age 20.   Clinical breast exam.** / Every year after age 40.   Mammogram.** / Every year beginning at age 40 and continuing for as long as you are in good health. Consult with your caregiver.   Pap Test.** / Every 3 years starting at age 30 years through age 65 or 70 with a history of 3 consecutive normal Pap tests.   HPV Screening.** / Every 3 years from  ages 30 through ages 65 to 70 with a history of 3 consecutive normal Pap tests.   Fecal occult blood test (FOBT) of stool. / Every year beginning at age 50 and continuing until age 75. You may not have to do this test if you get colonoscopy every 10 years.   Flexible sigmoidoscopy** or colonoscopy.** / Every 5 years for a flexible sigmoidoscopy or every 10 years for a colonoscopy beginning at age 50 and continuing until age 75.   Skin self-exam. / Monthly.   Influenza immunization.** / Every year.   Pneumococcal polysaccharide immunization.** / 1 to 2 doses if you smoke cigarettes or if you have certain chronic medical conditions.   Tetanus, diphtheria, pertussis (Tdap/Td) immunization.** / A one-time dose of Tdap vaccine. After that, you need a Td booster dose every 10 years.   Measles, mumps, rubella (MMR) immunization. / You need at least 1 dose of MMR if you were born in 1957 or later. You may also need a 2nd dose.   Varicella immunization. **/ Consult your caregiver.   Meningococcal immunization.** / Consult your caregiver.     Hepatitis A immunization. ** / Consult your caregiver. 2 doses, 6 to 18 months apart.   Hepatitis B immunization.** / Consult your caregiver. 3 doses, usually over 6 months.  Ages 65 and over  Blood pressure check.** / Every 1 to 2 years.   Lipid and cholesterol check.**/ Every 5 years beginning at age 20.   Clinical breast exam.** / Every year after age 40.   Mammogram.** / Every year beginning at age 40 and continuing for as long as you are in good health. Consult with your caregiver.   Pap Test,** / Every 3 years starting at age 30 years through age 65 or 70 with a 3 consecutive normal Pap tests. Testing can be stopped between 65 and 70 with 3 consecutive normal Pap tests and no abnormal Pap or HPV tests in the past 10 years.   HPV Screening.** / Every 3 years from ages 30 through ages 65 or 70 with a history of 3 consecutive normal Pap tests.  Testing can be stopped between 65 and 70 with 3 consecutive normal Pap tests and no abnormal Pap or HPV tests in the past 10 years.   Fecal occult blood test (FOBT) of stool. / Every year beginning at age 50 and continuing until age 75. You may not have to do this test if you get colonoscopy every 10 years.   Flexible sigmoidoscopy** or colonoscopy.** / Every 5 years for a flexible sigmoidoscopy or every 10 years for a colonoscopy beginning at age 50 and continuing until age 75.   Osteoporosis screening.** / A one-time screening for women ages 65 and over and women at risk for fractures or osteoporosis.   Skin self-exam. / Monthly.   Influenza immunization.** / Every year.   Pneumococcal polysaccharide immunization.** / 1 dose at age 65 (or older) if you have never been vaccinated.   Tetanus, diphtheria, pertussis (Tdap, Td) immunization. / A one-time dose of Tdap   vaccine if you are over 65 and have contact with an infant, are a healthcare worker, or simply want to be protected from whooping cough. After that, you need a Td booster dose every 10 years.   Varicella immunization. **/ Consult your caregiver.   Meningococcal immunization.** / Consult your caregiver.   Hepatitis A immunization. ** / Consult your caregiver. 2 doses, 6 to 18 months apart.   Hepatitis B immunization.** / Check with your caregiver. 3 doses, usually over 6 months.  ** Family history and personal history of risk and conditions may change your caregiver's recommendations. Document Released: 07/14/2001 Document Revised: 01/28/2011 Document Reviewed: 10/13/2010 [employer] Patient Information 2012 [employer]. 

## 2011-04-08 NOTE — Progress Notes (Signed)
  Subjective:     Monique Henderson is a 60 y.o. postmenopausal woman who comes in today for a  pap smear only.  Denies previous abnormal pap smears.  PCP did the rest of her physical exam including breast evaluation and she is up to date on preventative health maintenance.  Her PCP was not comfortable doing a pap smear so she was sent here.  Denies any bleeding or other GYN concerns.  The following portions of the patient's history were reviewed and updated as appropriate: allergies, current medications, past family history, past medical history, past social history, past surgical history and problem list.  Review of Systems A comprehensive review of systems was negative.   Objective:    BP 166/105  Pulse 78  Temp(Src) 99 F (37.2 C) (Oral)  Ht 5\' 3"  (1.6 m)  Wt 128 lb 14.4 oz (58.469 kg)  BMI 22.83 kg/m2 Pelvic Exam: cervix normal in appearance, external genitalia normal, no adnexal masses or tenderness, no cervical motion tenderness and vagina normal without discharge. Pap smear obtained.   Assessment:    Screening pap smear.   Plan:    Follow up in 1 year, or as indicated by Pap results.

## 2011-04-20 ENCOUNTER — Other Ambulatory Visit: Payer: Medicare Other

## 2011-04-20 ENCOUNTER — Other Ambulatory Visit: Payer: Self-pay | Admitting: Internal Medicine

## 2011-04-20 ENCOUNTER — Inpatient Hospital Stay
Admission: RE | Admit: 2011-04-20 | Discharge: 2011-04-20 | Payer: Medicare Other | Source: Ambulatory Visit | Attending: Neurological Surgery | Admitting: Neurological Surgery

## 2011-04-20 NOTE — Patient Instructions (Signed)

## 2011-04-22 ENCOUNTER — Ambulatory Visit
Admission: RE | Admit: 2011-04-22 | Discharge: 2011-04-22 | Disposition: A | Discharge status: Home / Self Care | Payer: Medicare Other | Source: Ambulatory Visit | Attending: Neurological Surgery | Admitting: Neurological Surgery

## 2011-04-22 VITALS — BP 126/80 | HR 54

## 2011-04-22 DIAGNOSIS — M545 Low back pain: Secondary | ICD-10-CM

## 2011-04-22 DIAGNOSIS — R252 Cramp and spasm: Secondary | ICD-10-CM

## 2011-04-22 MED ORDER — IOHEXOL 180 MG/ML  SOLN
16.0000 mL | Freq: Once | INTRAMUSCULAR | Status: AC | PRN
  Administered 2011-04-22: 16 mL via INTRATHECAL

## 2011-04-22 MED ORDER — DIAZEPAM 5 MG PO TABS
10.0000 mg | ORAL_TABLET | Freq: Once | ORAL | Status: AC
  Administered 2011-04-22: 10 mg via ORAL

## 2011-04-22 MED ORDER — HYDROMORPHONE HCL 2 MG PO TABS
2.0000 mg | ORAL_TABLET | Freq: Once | ORAL | Status: AC
  Administered 2011-04-22: 2 mg via ORAL

## 2011-04-22 NOTE — Patient Instructions (Signed)
Myelogram   Discharge Instructions  1. Go home and rest quietly for the next 24 hours.  It is important to lie flat for the next 24 hours.  Get up only to go to the restroom.  You may lie in the bed or on a couch on your back, your stomach, your left side or your right side.  You may have one pillow under your head.  You may have pillows between your knees while you are on your side or under your knees while you are on your back.  2. DO NOT drive today.  Recline the seat as far back as it will go, while still wearing your seat belt, on the way home.  3. You may get up to go to the bathroom as needed.  You may sit up for 10 minutes to eat.  You may resume your normal diet and medications unless otherwise indicated.  4. The incidence of headache, nausea, or vomiting is about 5% (one in 20 patients).  If you develop a headache, lie flat and drink plenty of fluids until the headache goes away.  Caffeinated beverages may be helpful.  If you develop severe nausea and vomiting or a headache that does not go away with flat bed rest, call [phone].  5. You may resume normal activities after your 24 hours of bed rest is over; however, do not exert yourself strongly or do any heavy lifting tomorrow.  6. Call your physician for a follow-up appointment.  The results of your myelogram will be sent directly to your physician by the following day.  7. If you have any questions or if complications develop after you arrive home, please call [phone].  Discharge instructions have been explained to the patient.  The patient, or the person responsible for the patient, fully understands these instructions.

## 2011-05-20 ENCOUNTER — Ambulatory Visit: Payer: Medicare Other | Attending: Anesthesiology

## 2011-05-20 DIAGNOSIS — M2560 Stiffness of unspecified joint, not elsewhere classified: Secondary | ICD-10-CM | POA: Insufficient documentation

## 2011-05-20 DIAGNOSIS — IMO0001 Reserved for inherently not codable concepts without codable children: Secondary | ICD-10-CM | POA: Insufficient documentation

## 2011-05-27 ENCOUNTER — Ambulatory Visit: Payer: Medicare Other

## 2011-05-27 ENCOUNTER — Other Ambulatory Visit: Payer: Self-pay | Admitting: Internal Medicine

## 2011-05-31 ENCOUNTER — Other Ambulatory Visit: Payer: Self-pay | Admitting: Internal Medicine

## 2011-06-01 ENCOUNTER — Ambulatory Visit: Payer: Medicare Other | Admitting: Rehabilitation

## 2011-06-08 ENCOUNTER — Encounter: Payer: Medicare Other | Admitting: Rehabilitation

## 2011-06-11 ENCOUNTER — Ambulatory Visit: Payer: Medicare Other | Attending: Anesthesiology | Admitting: Rehabilitation

## 2011-06-11 DIAGNOSIS — M2560 Stiffness of unspecified joint, not elsewhere classified: Secondary | ICD-10-CM | POA: Insufficient documentation

## 2011-06-11 DIAGNOSIS — IMO0001 Reserved for inherently not codable concepts without codable children: Secondary | ICD-10-CM | POA: Insufficient documentation

## 2011-06-15 ENCOUNTER — Ambulatory Visit: Payer: Medicare Other | Admitting: Rehabilitation

## 2011-06-18 ENCOUNTER — Ambulatory Visit: Payer: Medicare Other | Admitting: Rehabilitation

## 2011-06-25 ENCOUNTER — Other Ambulatory Visit: Payer: Self-pay | Admitting: Internal Medicine

## 2011-06-25 DIAGNOSIS — Z1231 Encounter for screening mammogram for malignant neoplasm of breast: Secondary | ICD-10-CM

## 2011-07-03 ENCOUNTER — Ambulatory Visit: Payer: Medicare Other

## 2011-07-03 ENCOUNTER — Other Ambulatory Visit: Payer: Self-pay | Admitting: Internal Medicine

## 2011-07-08 NOTE — Telephone Encounter (Signed)
Called to pharm 

## 2011-07-08 NOTE — Telephone Encounter (Signed)
Refill approved - nurse to complete. 

## 2011-07-09 ENCOUNTER — Ambulatory Visit: Payer: Medicare Other

## 2011-07-10 DIAGNOSIS — Z79899 Other long term (current) drug therapy: Secondary | ICD-10-CM | POA: Diagnosis not present

## 2011-07-10 DIAGNOSIS — M5430 Sciatica, unspecified side: Secondary | ICD-10-CM | POA: Diagnosis not present

## 2011-07-10 DIAGNOSIS — F341 Dysthymic disorder: Secondary | ICD-10-CM | POA: Diagnosis not present

## 2011-07-10 DIAGNOSIS — M545 Low back pain: Secondary | ICD-10-CM | POA: Diagnosis not present

## 2011-07-10 DIAGNOSIS — H81399 Other peripheral vertigo, unspecified ear: Secondary | ICD-10-CM | POA: Diagnosis not present

## 2011-07-16 ENCOUNTER — Ambulatory Visit
Admission: RE | Admit: 2011-07-16 | Discharge: 2011-07-16 | Disposition: A | Discharge status: Home / Self Care | Payer: Medicare Other | Source: Ambulatory Visit | Attending: Internal Medicine | Admitting: Internal Medicine

## 2011-07-16 DIAGNOSIS — Z1231 Encounter for screening mammogram for malignant neoplasm of breast: Secondary | ICD-10-CM

## 2011-08-07 DIAGNOSIS — H81399 Other peripheral vertigo, unspecified ear: Secondary | ICD-10-CM | POA: Diagnosis not present

## 2011-08-07 DIAGNOSIS — F341 Dysthymic disorder: Secondary | ICD-10-CM | POA: Diagnosis not present

## 2011-08-07 DIAGNOSIS — M542 Cervicalgia: Secondary | ICD-10-CM | POA: Diagnosis not present

## 2011-08-07 DIAGNOSIS — M5430 Sciatica, unspecified side: Secondary | ICD-10-CM | POA: Diagnosis not present

## 2011-08-07 DIAGNOSIS — M545 Low back pain: Secondary | ICD-10-CM | POA: Diagnosis not present

## 2011-08-07 DIAGNOSIS — Z79899 Other long term (current) drug therapy: Secondary | ICD-10-CM | POA: Diagnosis not present

## 2011-08-08 ENCOUNTER — Other Ambulatory Visit: Payer: Self-pay | Admitting: Internal Medicine

## 2011-09-04 DIAGNOSIS — M545 Low back pain: Secondary | ICD-10-CM | POA: Diagnosis not present

## 2011-09-04 DIAGNOSIS — F341 Dysthymic disorder: Secondary | ICD-10-CM | POA: Diagnosis not present

## 2011-09-04 DIAGNOSIS — H81399 Other peripheral vertigo, unspecified ear: Secondary | ICD-10-CM | POA: Diagnosis not present

## 2011-09-04 DIAGNOSIS — M5430 Sciatica, unspecified side: Secondary | ICD-10-CM | POA: Diagnosis not present

## 2011-09-15 ENCOUNTER — Other Ambulatory Visit: Payer: Self-pay | Admitting: Internal Medicine

## 2011-09-16 NOTE — Telephone Encounter (Signed)
Ambien rx called to [facility] pharmacy. 

## 2011-10-02 ENCOUNTER — Other Ambulatory Visit: Payer: Self-pay | Admitting: Internal Medicine

## 2011-10-02 DIAGNOSIS — M5430 Sciatica, unspecified side: Secondary | ICD-10-CM | POA: Diagnosis not present

## 2011-10-02 DIAGNOSIS — M545 Low back pain: Secondary | ICD-10-CM | POA: Diagnosis not present

## 2011-10-02 DIAGNOSIS — F341 Dysthymic disorder: Secondary | ICD-10-CM | POA: Diagnosis not present

## 2011-10-02 DIAGNOSIS — H81399 Other peripheral vertigo, unspecified ear: Secondary | ICD-10-CM | POA: Diagnosis not present

## 2011-10-27 ENCOUNTER — Other Ambulatory Visit: Payer: Self-pay | Admitting: Internal Medicine

## 2011-10-27 NOTE — Telephone Encounter (Signed)
Refill approved - nurse to complete. 

## 2011-10-27 NOTE — Telephone Encounter (Signed)
Rx called in 

## 2011-10-30 DIAGNOSIS — H81399 Other peripheral vertigo, unspecified ear: Secondary | ICD-10-CM | POA: Diagnosis not present

## 2011-10-30 DIAGNOSIS — F341 Dysthymic disorder: Secondary | ICD-10-CM | POA: Diagnosis not present

## 2011-10-30 DIAGNOSIS — M5430 Sciatica, unspecified side: Secondary | ICD-10-CM | POA: Diagnosis not present

## 2011-10-30 DIAGNOSIS — M545 Low back pain: Secondary | ICD-10-CM | POA: Diagnosis not present

## 2011-11-20 ENCOUNTER — Other Ambulatory Visit: Payer: Self-pay | Admitting: Internal Medicine

## 2011-11-20 NOTE — Telephone Encounter (Signed)
Rx was called to the pharmacy

## 2011-11-20 NOTE — Telephone Encounter (Signed)
Ambien rx called to [facility] pharmacy. 

## 2011-11-20 NOTE — Telephone Encounter (Signed)
Refill approved - nurse to complete. 

## 2011-11-27 DIAGNOSIS — H81399 Other peripheral vertigo, unspecified ear: Secondary | ICD-10-CM | POA: Diagnosis not present

## 2011-11-27 DIAGNOSIS — F341 Dysthymic disorder: Secondary | ICD-10-CM | POA: Diagnosis not present

## 2011-11-27 DIAGNOSIS — M542 Cervicalgia: Secondary | ICD-10-CM | POA: Diagnosis not present

## 2011-11-27 DIAGNOSIS — M545 Low back pain: Secondary | ICD-10-CM | POA: Diagnosis not present

## 2011-11-27 DIAGNOSIS — M5430 Sciatica, unspecified side: Secondary | ICD-10-CM | POA: Diagnosis not present

## 2011-11-27 DIAGNOSIS — Z79899 Other long term (current) drug therapy: Secondary | ICD-10-CM | POA: Diagnosis not present

## 2011-12-25 DIAGNOSIS — M545 Low back pain: Secondary | ICD-10-CM | POA: Diagnosis not present

## 2011-12-25 DIAGNOSIS — M5430 Sciatica, unspecified side: Secondary | ICD-10-CM | POA: Diagnosis not present

## 2011-12-25 DIAGNOSIS — M542 Cervicalgia: Secondary | ICD-10-CM | POA: Diagnosis not present

## 2011-12-25 DIAGNOSIS — Z79899 Other long term (current) drug therapy: Secondary | ICD-10-CM | POA: Diagnosis not present

## 2011-12-25 DIAGNOSIS — F341 Dysthymic disorder: Secondary | ICD-10-CM | POA: Diagnosis not present

## 2011-12-25 DIAGNOSIS — H81399 Other peripheral vertigo, unspecified ear: Secondary | ICD-10-CM | POA: Diagnosis not present

## 2012-01-01 ENCOUNTER — Other Ambulatory Visit: Payer: Self-pay | Admitting: *Deleted

## 2012-01-01 MED ORDER — FAMOTIDINE 20 MG PO TABS: 20.0000 mg | ORAL_TABLET | Freq: Every day | ORAL | Status: DC

## 2012-01-01 MED ORDER — CLONIDINE HCL 0.1 MG PO TABS: 0.1000 mg | ORAL_TABLET | Freq: Two times a day (BID) | ORAL | Status: DC

## 2012-01-01 MED ORDER — AMLODIPINE BESYLATE 10 MG PO TABS: 10.0000 mg | ORAL_TABLET | Freq: Every day | ORAL | Status: DC

## 2012-01-01 MED ORDER — TRAMADOL HCL 50 MG PO TABS
50.0000 mg | ORAL_TABLET | Freq: Four times a day (QID) | ORAL | Status: DC | PRN
Start: 2012-01-01 — End: 2012-04-20

## 2012-01-01 MED ORDER — ZOLPIDEM TARTRATE 10 MG PO TABS: 10.0000 mg | ORAL_TABLET | Freq: Every evening | ORAL | Status: DC | PRN

## 2012-01-01 NOTE — Telephone Encounter (Signed)
This is a Psychologist, clinical

## 2012-01-01 NOTE — Telephone Encounter (Signed)
1 rx called in

## 2012-01-01 NOTE — Telephone Encounter (Signed)
Refill approved - nurse to complete.  Please schedule a follow-up appointment with me.

## 2012-01-22 DIAGNOSIS — M545 Low back pain: Secondary | ICD-10-CM | POA: Diagnosis not present

## 2012-01-22 DIAGNOSIS — M5430 Sciatica, unspecified side: Secondary | ICD-10-CM | POA: Diagnosis not present

## 2012-01-22 DIAGNOSIS — H81399 Other peripheral vertigo, unspecified ear: Secondary | ICD-10-CM | POA: Diagnosis not present

## 2012-01-22 DIAGNOSIS — F341 Dysthymic disorder: Secondary | ICD-10-CM | POA: Diagnosis not present

## 2012-02-10 ENCOUNTER — Ambulatory Visit: Payer: Medicare Other | Admitting: Internal Medicine

## 2012-02-18 DIAGNOSIS — H81399 Other peripheral vertigo, unspecified ear: Secondary | ICD-10-CM | POA: Diagnosis not present

## 2012-02-18 DIAGNOSIS — Z79899 Other long term (current) drug therapy: Secondary | ICD-10-CM | POA: Diagnosis not present

## 2012-02-18 DIAGNOSIS — M5430 Sciatica, unspecified side: Secondary | ICD-10-CM | POA: Diagnosis not present

## 2012-02-18 DIAGNOSIS — M542 Cervicalgia: Secondary | ICD-10-CM | POA: Diagnosis not present

## 2012-02-18 DIAGNOSIS — F341 Dysthymic disorder: Secondary | ICD-10-CM | POA: Diagnosis not present

## 2012-02-18 DIAGNOSIS — M545 Low back pain: Secondary | ICD-10-CM | POA: Diagnosis not present

## 2012-03-07 DIAGNOSIS — F4320 Adjustment disorder, unspecified: Secondary | ICD-10-CM | POA: Diagnosis not present

## 2012-03-14 DIAGNOSIS — H81399 Other peripheral vertigo, unspecified ear: Secondary | ICD-10-CM | POA: Diagnosis not present

## 2012-03-14 DIAGNOSIS — F341 Dysthymic disorder: Secondary | ICD-10-CM | POA: Diagnosis not present

## 2012-03-14 DIAGNOSIS — M5430 Sciatica, unspecified side: Secondary | ICD-10-CM | POA: Diagnosis not present

## 2012-03-14 DIAGNOSIS — M545 Low back pain: Secondary | ICD-10-CM | POA: Diagnosis not present

## 2012-03-31 ENCOUNTER — Other Ambulatory Visit: Payer: Self-pay | Admitting: *Deleted

## 2012-04-01 MED ORDER — ZOLPIDEM TARTRATE 10 MG PO TABS: 10.0000 mg | ORAL_TABLET | Freq: Every evening | ORAL | Status: DC | PRN

## 2012-04-01 MED ORDER — AMLODIPINE BESYLATE 10 MG PO TABS: 10.0000 mg | ORAL_TABLET | Freq: Every day | ORAL | Status: DC

## 2012-04-01 NOTE — Telephone Encounter (Signed)
Refill approved - nurse to call in. 

## 2012-04-04 NOTE — Telephone Encounter (Signed)
Both Rx called in to pharmacy. 

## 2012-04-20 ENCOUNTER — Encounter: Payer: Self-pay | Admitting: Internal Medicine

## 2012-04-20 ENCOUNTER — Ambulatory Visit (INDEPENDENT_AMBULATORY_CARE_PROVIDER_SITE_OTHER): Payer: Medicare Other | Admitting: Internal Medicine

## 2012-04-20 VITALS — BP 160/94 | HR 65 | Temp 97.8°F | Ht 64.0 in | Wt 129.4 lb

## 2012-04-20 DIAGNOSIS — B1710 Acute hepatitis C without hepatic coma: Secondary | ICD-10-CM | POA: Diagnosis not present

## 2012-04-20 DIAGNOSIS — R229 Localized swelling, mass and lump, unspecified: Secondary | ICD-10-CM | POA: Diagnosis not present

## 2012-04-20 DIAGNOSIS — F17200 Nicotine dependence, unspecified, uncomplicated: Secondary | ICD-10-CM | POA: Diagnosis not present

## 2012-04-20 DIAGNOSIS — I10 Essential (primary) hypertension: Secondary | ICD-10-CM | POA: Diagnosis not present

## 2012-04-20 DIAGNOSIS — N259 Disorder resulting from impaired renal tubular function, unspecified: Secondary | ICD-10-CM

## 2012-04-20 DIAGNOSIS — L84 Corns and callosities: Secondary | ICD-10-CM | POA: Insufficient documentation

## 2012-04-20 DIAGNOSIS — M545 Low back pain, unspecified: Secondary | ICD-10-CM | POA: Diagnosis not present

## 2012-04-20 DIAGNOSIS — K219 Gastro-esophageal reflux disease without esophagitis: Secondary | ICD-10-CM | POA: Diagnosis not present

## 2012-04-20 LAB — CBC WITH DIFFERENTIAL/PLATELET
Eosinophils Absolute: 0.2 10*3/uL (ref 0.0–0.7)
Eosinophils Relative: 3 % (ref 0–5)
HCT: 41.5 % (ref 36.0–46.0)
Hemoglobin: 13.8 g/dL (ref 12.0–15.0)
Lymphs Abs: 2.5 10*3/uL (ref 0.7–4.0)
MCH: 29.6 pg (ref 26.0–34.0)
MCV: 89.1 fL (ref 78.0–100.0)
Monocytes Absolute: 0.5 10*3/uL (ref 0.1–1.0)
Monocytes Relative: 9 % (ref 3–12)
Platelets: 402 10*3/uL — ABNORMAL HIGH (ref 150–400)
RBC: 4.66 MIL/uL (ref 3.87–5.11)

## 2012-04-20 LAB — LIPID PANEL
Total CHOL/HDL Ratio: 3 Ratio
VLDL: 12 mg/dL (ref 0–40)

## 2012-04-20 MED ORDER — FLUTICASONE PROPIONATE 50 MCG/ACT NA SUSP: 2.0000 | Freq: Every day | NASAL | Status: DC

## 2012-04-20 MED ORDER — ZOLPIDEM TARTRATE 5 MG PO TABS: 5.0000 mg | ORAL_TABLET | Freq: Every evening | ORAL | Status: DC | PRN

## 2012-04-20 MED ORDER — LISINOPRIL 10 MG PO TABS: 10.0000 mg | ORAL_TABLET | Freq: Every day | ORAL | Status: DC

## 2012-04-20 MED ORDER — CLONIDINE HCL 0.1 MG PO TABS: 0.1000 mg | ORAL_TABLET | Freq: Two times a day (BID) | ORAL | Status: DC

## 2012-04-20 MED ORDER — FAMOTIDINE 20 MG PO TABS: 20.0000 mg | ORAL_TABLET | Freq: Every day | ORAL | Status: DC

## 2012-04-20 MED ORDER — TRAMADOL HCL 50 MG PO TABS: 50.0000 mg | ORAL_TABLET | Freq: Four times a day (QID) | ORAL | Status: DC | PRN

## 2012-04-20 MED ORDER — CETIRIZINE HCL 10 MG PO TABS: 10.0000 mg | ORAL_TABLET | Freq: Every day | ORAL | Status: DC

## 2012-04-20 MED ORDER — OMEPRAZOLE 40 MG PO CPDR: 40.0000 mg | DELAYED_RELEASE_CAPSULE | Freq: Every day | ORAL | Status: DC

## 2012-04-20 MED ORDER — ASPIRIN 325 MG PO TABS: 325.0000 mg | ORAL_TABLET | Freq: Every day | ORAL | Status: DC

## 2012-04-20 NOTE — Assessment & Plan Note (Signed)
Assessment: Patient symptoms are not well-controlled on famotidine 20 mg daily.  Plan: Add omeprazole 40 mg daily to her regimen.

## 2012-04-20 NOTE — Progress Notes (Signed)
  Subjective:    Patient ID: Monique Henderson, female    DOB: March 06, 1951, 61 y.o.   MRN: [id number]  HPI Patient returns for followup of her hypertension, chronic renal insufficiency, chronic low back pain, GERD, and other medical problems.  Today she also reports a small tender nodule at the base of her left thumb, and a callus on her left anterolateral sole just proximal to her great toe.  She has been taking tramadol for her back pain and reports adequate relief of the back pain with the tramadol.  She has no sciatic pain, and no lower extremity weakness or numbness.  She has had some symptoms of indigestion/reflux on famotidine; she has not been taking a PPI for several months.  She reports that she has been compliant with her medications.  She continues to smoke about 3 cigarettes per day.    Review of Systems  Constitutional: Negative for fever and chills.  Respiratory: Negative for shortness of breath.   Cardiovascular: Negative for chest pain and leg swelling.  Gastrointestinal: Negative for nausea, vomiting, abdominal pain and blood in stool.  Musculoskeletal: Positive for back pain (Chronic low back pain).   I have reviewed the patient's medical history in detail and updated the computerized patient record.     Objective:   Physical Exam  Constitutional: No distress.  Cardiovascular: Normal rate, regular rhythm and normal heart sounds.  Exam reveals no gallop and no friction rub.   No murmur heard. Pulmonary/Chest: Effort normal and breath sounds normal. She has no wheezes. She has no rales.  Abdominal: Soft. Bowel sounds are normal. There is no tenderness. There is no guarding.  Musculoskeletal: She exhibits no edema.  Skin:          Assessment & Plan:

## 2012-04-20 NOTE — Patient Instructions (Signed)
Start lisinopril 10 mg daily for high blood pressure. Continue other medications.

## 2012-04-20 NOTE — Assessment & Plan Note (Addendum)
Assessment: Patient has a history of hepatitis C , but I do not see recent documentation.  She reports no prior treatment.  Plan: Will check a hepatitis C antibody and viral load.

## 2012-04-20 NOTE — Assessment & Plan Note (Signed)
  Component Value Date/Time   CREATININE 1.43* 02/25/2011 0946   CREATININE 1.36* 12/18/2009 1918   CREATININE 1.26* 10/30/2009 1944   CREATININE 1.56* 07/17/2009 1819     Assessment: Patient has mild chronic renal insufficiency, which is likely related to her chronic hypertension.  Plan: Will check labs today including a comprehensive metabolic panel and CBC with differential.

## 2012-04-20 NOTE — Assessment & Plan Note (Signed)
Assessment: Patient has chronic low back pain which is stable and well controlled on tramadol, which she takes on an as-needed basis.    Plan: Continue tramadol at current dose.

## 2012-04-20 NOTE — Assessment & Plan Note (Signed)
Assessment: Patient is only smoking about 3 cigarettes per day.    Plan: I discussed the risks of continued smoking, and I advised patient to stop smoking entirely and she agreed to do so.

## 2012-04-20 NOTE — Assessment & Plan Note (Signed)
Lab Results  Component Value Date   NA 140 02/25/2011   K 3.9 02/25/2011   CL 102 02/25/2011   CO2 28 02/25/2011   BUN 21 02/25/2011   CREATININE 1.43* 02/25/2011    BP Readings from Last 3 Encounters:  04/20/12 160/94  04/22/11 126/80  04/08/11 166/105    Assessment: Hypertension control:  moderately elevated  Progress toward goals:  deteriorated Comments: Patient's blood pressure is not well controlled; in the past she was on lisinopril 10 mg daily in addition to her current regimen, and had no apparent problems with the ACE inhibitor.     Plan: Continue amlodipine 10 mg daily and clonidine 0.1 mg twice a day; start lisinopril 10 mg daily.

## 2012-04-20 NOTE — Assessment & Plan Note (Signed)
Assessment: Patient has a small subcutaneous mildly tender nodule at the base of her left thumb.  There is no sign of infection.  Plan: Dermatology referral.

## 2012-04-20 NOTE — Assessment & Plan Note (Signed)
Assessment: Patient has a callus on her anteromedial left foot just proximal to her great toe.  Plan: Plan is refer to a podiatrist.

## 2012-04-21 LAB — URINALYSIS, MICROSCOPIC ONLY

## 2012-04-21 LAB — COMPLETE METABOLIC PANEL WITH GFR
BUN: 22 mg/dL (ref 6–23)
CO2: 29 mEq/L (ref 19–32)
Calcium: 9.9 mg/dL (ref 8.4–10.5)
Chloride: 101 mEq/L (ref 96–112)
Creat: 1.66 mg/dL — ABNORMAL HIGH (ref 0.50–1.10)
GFR, Est African American: 39 mL/min — ABNORMAL LOW
GFR, Est Non African American: 33 mL/min — ABNORMAL LOW
Glucose, Bld: 79 mg/dL (ref 70–99)

## 2012-04-21 LAB — HEPATITIS C RNA QUANTITATIVE: HCV Quantitative: 91791 IU/mL — ABNORMAL HIGH (ref <15)

## 2012-04-21 LAB — URINALYSIS, ROUTINE W REFLEX MICROSCOPIC
Bilirubin Urine: NEGATIVE
Glucose, UA: NEGATIVE mg/dL
Ketones, ur: NEGATIVE mg/dL
Protein, ur: 30 mg/dL — AB
Urobilinogen, UA: 0.2 mg/dL (ref 0.0–1.0)

## 2012-04-21 LAB — HEPATITIS C ANTIBODY: HCV Ab: REACTIVE — AB

## 2012-04-22 DIAGNOSIS — Z79899 Other long term (current) drug therapy: Secondary | ICD-10-CM | POA: Diagnosis not present

## 2012-04-22 DIAGNOSIS — M545 Low back pain: Secondary | ICD-10-CM | POA: Diagnosis not present

## 2012-04-22 DIAGNOSIS — M542 Cervicalgia: Secondary | ICD-10-CM | POA: Diagnosis not present

## 2012-04-22 DIAGNOSIS — H81399 Other peripheral vertigo, unspecified ear: Secondary | ICD-10-CM | POA: Diagnosis not present

## 2012-04-22 DIAGNOSIS — F341 Dysthymic disorder: Secondary | ICD-10-CM | POA: Diagnosis not present

## 2012-04-22 DIAGNOSIS — M5430 Sciatica, unspecified side: Secondary | ICD-10-CM | POA: Diagnosis not present

## 2012-05-05 ENCOUNTER — Telehealth: Payer: Self-pay | Admitting: Internal Medicine

## 2012-05-05 DIAGNOSIS — N259 Disorder resulting from impaired renal tubular function, unspecified: Secondary | ICD-10-CM

## 2012-05-05 DIAGNOSIS — B1710 Acute hepatitis C without hepatic coma: Secondary | ICD-10-CM

## 2012-05-05 NOTE — Assessment & Plan Note (Addendum)
Assessment: Labs from 11/20 showed reactive hepatitis C antibody with hepatitis C viral load of 91,791.  I called patient today and discussed her hepatitis C results.  She reports that she was seen in the past at the hepatitis clinic in [place], but says that she has not been treated for hepatitis C.    Plan: The plan is to refer to the hepatitis clinic for management of hepatitis C.

## 2012-05-05 NOTE — Telephone Encounter (Signed)
I called patient and discussed her lab work from 11/20, including her hepatitis C results and her creatinine which has been gradually increasing.  She reports that she was seen in the past at the hepatitis clinic in [place], but says that she has not been treated for hepatitis C.  The plan is to refer to the hepatitis clinic for management of hepatitis C.  Will repeat her creatinine in January and consider referral for nephrology evaluation.

## 2012-05-05 NOTE — Assessment & Plan Note (Addendum)
Lab Results  Component Value Date   CREATININE 1.66* 04/20/2012   CREATININE 1.43* 02/25/2011   CREATININE 1.36* 12/18/2009   CREATININE 1.26* 10/30/2009   CREATININE 1.56* 07/17/2009   CREATININE 1.23* 05/23/2009     Assessment: Labs from 11/20 show creatinine a little above baseline.  I discussed this today with patient by phone.  Plan: Will repeat creatinine in January and consider referral for nephrology evaluation if elevated.

## 2012-05-13 DIAGNOSIS — M2010 Hallux valgus (acquired), unspecified foot: Secondary | ICD-10-CM | POA: Diagnosis not present

## 2012-05-13 DIAGNOSIS — M779 Enthesopathy, unspecified: Secondary | ICD-10-CM | POA: Diagnosis not present

## 2012-05-13 DIAGNOSIS — D2370 Other benign neoplasm of skin of unspecified lower limb, including hip: Secondary | ICD-10-CM | POA: Diagnosis not present

## 2012-05-13 DIAGNOSIS — G5760 Lesion of plantar nerve, unspecified lower limb: Secondary | ICD-10-CM | POA: Diagnosis not present

## 2012-05-18 DIAGNOSIS — H81399 Other peripheral vertigo, unspecified ear: Secondary | ICD-10-CM | POA: Diagnosis not present

## 2012-05-18 DIAGNOSIS — M545 Low back pain: Secondary | ICD-10-CM | POA: Diagnosis not present

## 2012-05-18 DIAGNOSIS — F341 Dysthymic disorder: Secondary | ICD-10-CM | POA: Diagnosis not present

## 2012-05-18 DIAGNOSIS — G541 Lumbosacral plexus disorders: Secondary | ICD-10-CM | POA: Diagnosis not present

## 2012-05-19 ENCOUNTER — Other Ambulatory Visit: Payer: Self-pay | Admitting: *Deleted

## 2012-05-20 MED ORDER — AMLODIPINE BESYLATE 10 MG PO TABS: 10.0000 mg | ORAL_TABLET | Freq: Every day | ORAL | Status: DC

## 2012-05-20 NOTE — Telephone Encounter (Signed)
I changed the zolpidem (Ambien) dose on 11/20 and gave 2 additional refills.

## 2012-05-23 NOTE — Telephone Encounter (Signed)
Rx  Called in to pharmacy. Hilda Blades Hriday Stai RN 05/23/12 1:50PM

## 2012-05-27 DIAGNOSIS — D2370 Other benign neoplasm of skin of unspecified lower limb, including hip: Secondary | ICD-10-CM | POA: Diagnosis not present

## 2012-05-27 DIAGNOSIS — M659 Synovitis and tenosynovitis, unspecified: Secondary | ICD-10-CM | POA: Diagnosis not present

## 2012-06-01 HISTORY — PX: BREAST LUMPECTOMY: SHX2

## 2012-06-09 DIAGNOSIS — M7130 Other bursal cyst, unspecified site: Secondary | ICD-10-CM | POA: Diagnosis not present

## 2012-06-13 ENCOUNTER — Other Ambulatory Visit: Payer: Self-pay | Admitting: Internal Medicine

## 2012-06-13 DIAGNOSIS — Z1231 Encounter for screening mammogram for malignant neoplasm of breast: Secondary | ICD-10-CM

## 2012-06-16 DIAGNOSIS — G541 Lumbosacral plexus disorders: Secondary | ICD-10-CM | POA: Diagnosis not present

## 2012-06-16 DIAGNOSIS — F341 Dysthymic disorder: Secondary | ICD-10-CM | POA: Diagnosis not present

## 2012-06-16 DIAGNOSIS — H81399 Other peripheral vertigo, unspecified ear: Secondary | ICD-10-CM | POA: Diagnosis not present

## 2012-06-16 DIAGNOSIS — M545 Low back pain: Secondary | ICD-10-CM | POA: Diagnosis not present

## 2012-06-24 ENCOUNTER — Encounter (HOSPITAL_COMMUNITY): Payer: Self-pay | Admitting: *Deleted

## 2012-06-24 ENCOUNTER — Emergency Department (HOSPITAL_COMMUNITY): Payer: Medicare Other

## 2012-06-24 ENCOUNTER — Emergency Department (HOSPITAL_COMMUNITY)
Admission: EM | Admit: 2012-06-24 | Discharge: 2012-06-24 | Disposition: A | Discharge status: Home / Self Care | Payer: Medicare Other | Attending: Emergency Medicine | Admitting: Emergency Medicine

## 2012-06-24 DIAGNOSIS — Z7982 Long term (current) use of aspirin: Secondary | ICD-10-CM | POA: Diagnosis not present

## 2012-06-24 DIAGNOSIS — G8911 Acute pain due to trauma: Secondary | ICD-10-CM | POA: Insufficient documentation

## 2012-06-24 DIAGNOSIS — Z853 Personal history of malignant neoplasm of breast: Secondary | ICD-10-CM | POA: Diagnosis not present

## 2012-06-24 DIAGNOSIS — Z8742 Personal history of other diseases of the female genital tract: Secondary | ICD-10-CM | POA: Diagnosis not present

## 2012-06-24 DIAGNOSIS — Z8739 Personal history of other diseases of the musculoskeletal system and connective tissue: Secondary | ICD-10-CM | POA: Insufficient documentation

## 2012-06-24 DIAGNOSIS — Z87442 Personal history of urinary calculi: Secondary | ICD-10-CM | POA: Diagnosis not present

## 2012-06-24 DIAGNOSIS — K219 Gastro-esophageal reflux disease without esophagitis: Secondary | ICD-10-CM | POA: Insufficient documentation

## 2012-06-24 DIAGNOSIS — IMO0002 Reserved for concepts with insufficient information to code with codable children: Secondary | ICD-10-CM | POA: Diagnosis not present

## 2012-06-24 DIAGNOSIS — M549 Dorsalgia, unspecified: Secondary | ICD-10-CM | POA: Diagnosis not present

## 2012-06-24 DIAGNOSIS — M545 Low back pain: Secondary | ICD-10-CM | POA: Diagnosis not present

## 2012-06-24 DIAGNOSIS — F17200 Nicotine dependence, unspecified, uncomplicated: Secondary | ICD-10-CM | POA: Diagnosis not present

## 2012-06-24 DIAGNOSIS — Z79899 Other long term (current) drug therapy: Secondary | ICD-10-CM | POA: Diagnosis not present

## 2012-06-24 DIAGNOSIS — Z8719 Personal history of other diseases of the digestive system: Secondary | ICD-10-CM | POA: Insufficient documentation

## 2012-06-24 DIAGNOSIS — I10 Essential (primary) hypertension: Secondary | ICD-10-CM | POA: Diagnosis not present

## 2012-06-24 DIAGNOSIS — Z862 Personal history of diseases of the blood and blood-forming organs and certain disorders involving the immune mechanism: Secondary | ICD-10-CM | POA: Diagnosis not present

## 2012-06-24 MED ORDER — OXYCODONE-ACETAMINOPHEN 5-325 MG PO TABS
2.0000 | ORAL_TABLET | Freq: Once | ORAL | Status: AC
  Administered 2012-06-24: 2 via ORAL
  Filled 2012-06-24: qty 2

## 2012-06-24 MED ORDER — OXYCODONE-ACETAMINOPHEN 5-325 MG PO TABS: 2.0000 | ORAL_TABLET | ORAL | Status: DC | PRN

## 2012-06-24 NOTE — ED Provider Notes (Signed)
Medical screening examination/treatment/procedure(s) were performed by non-physician practitioner and as supervising physician I was immediately available for consultation/collaboration.  Jasper Riling. Alvino Chapel, MD 06/24/12 1348

## 2012-06-24 NOTE — ED Notes (Signed)
Patient states back pain x 3 weeks from injury while walking down stairs, patient with history of back problems

## 2012-06-24 NOTE — ED Provider Notes (Signed)
History     CSN: [id number]  Arrival date & time 06/24/12  0817   None     No chief complaint on file.   (Consider location/radiation/quality/duration/timing/severity/associated sxs/prior treatment) Patient is a 62 y.o. female presenting with back pain. The history is provided by the patient. No language interpreter was used.  Back Pain  This is a new problem. The problem occurs constantly. The problem has been gradually worsening. The pain is associated with falling. The pain is present in the lumbar spine. The quality of the pain is described as aching. The pain is at a severity of 5/10. The pain is moderate. Stiffness is present all day. Pertinent negatives include no leg pain. She has tried nothing for the symptoms. The treatment provided no relief.  Pt complains of falling 2 weeks ago and hitting her back.  Pt reports she has a history of back problems  Past Medical History  Diagnosis Date  . GERD (gastroesophageal reflux disease)   . Hepatitis C   . Hypertension   . Low back pain   . Osteoarthritis   . Uterine fibroid   . Normocytic anemia     With thrombocytosis  . Right ureteral stone 2002  . Atelectasis 2002    Bilateral  . Bone spur 2008    Right calcaneal foot spur  . Lumbar spinal stenosis     S/P lumbar decompressive laminectomy, fusion and plating for lumbar spinal stensosis  . Breast cancer 2004    Ductal carcinoma in situ of the left breast; S/P left partial mastectomy 02/26/2003; S/P re-excision of cranial and lateral margins11/18/2004.    Past Surgical History  Procedure Date  . Laminectomy 05/27/2009    Lumbar decompressive laminectomy, fusion and plating for lumbar spinal stensosis  . Mastectomy, partial 02/26/2003    S/P left partial mastectomy 02/26/2003; S/P re-excision of cranial and lateral margins11/18/2004.     Family History  Problem Relation Age of Onset  . Colon cancer Mother 18  . Cancer Mother   . Hypertension Mother   . Diabetes Sister  40  . Hypertension Sister   . Breast cancer Neg Hx   . Cervical cancer Neg Hx   . Diabetes Brother   . Hypertension Brother   . Diabetes Brother   . Hypertension Brother   . Kidney disease Son     On dialysis  . Hypertension Son   . Diabetes Son   . Multiple sclerosis Son     History  Substance Use Topics  . Smoking status: Current Some Day Smoker -- 0.2 packs/day for 32 years    Types: Cigarettes  . Smokeless tobacco: Never Used  . Alcohol Use: 0.0 oz/week    1-2 Cans of beer per week     Comment: Occasional     OB History    Grav Para Term Preterm Abortions TAB SAB Ect Mult Living   2 2 1 1             Review of Systems  Musculoskeletal: Positive for back pain.  All other systems reviewed and are negative.    Allergies  Review of patient's allergies indicates no known allergies.  Home Medications   Current Outpatient Rx  Name  Route  Sig  Dispense  Refill  . AMLODIPINE BESYLATE 10 MG PO TABS   Oral   Take 1 tablet (10 mg total) by mouth daily.   90 tablet   2   . ASPIRIN 325 MG PO TABS   Oral  Take 1 tablet (325 mg total) by mouth daily.   100 tablet   3   . VITAMIN B COMPLEX PO   Oral   Take 1 tablet by mouth daily.           Marland Kitchen CETIRIZINE HCL 10 MG PO TABS   Oral   Take 1 tablet (10 mg total) by mouth daily.   30 tablet   11   . CLONIDINE HCL 0.1 MG PO TABS   Oral   Take 1 tablet (0.1 mg total) by mouth 2 (two) times daily.   60 tablet   11   . FAMOTIDINE 20 MG PO TABS   Oral   Take 1 tablet (20 mg total) by mouth at bedtime.   30 tablet   11   . FLUTICASONE PROPIONATE 50 MCG/ACT NA SUSP   Nasal   Place 2 sprays into the nose daily.   16 g   11   . LISINOPRIL 10 MG PO TABS   Oral   Take 1 tablet (10 mg total) by mouth daily.   30 tablet   6   . OMEPRAZOLE 40 MG PO CPDR   Oral   Take 1 capsule (40 mg total) by mouth daily.   30 capsule   11   . TRAMADOL HCL 50 MG PO TABS   Oral   Take 1 tablet (50 mg total) by mouth  every 6 (six) hours as needed for pain.   90 tablet   3   . ZOLPIDEM TARTRATE 5 MG PO TABS   Oral   Take 1 tablet (5 mg total) by mouth at bedtime as needed for sleep.   30 tablet   2     There were no vitals taken for this visit.  Physical Exam  Nursing note and vitals reviewed. Constitutional: She is oriented to person, place, and time. She appears well-developed and well-nourished.  HENT:  Head: Normocephalic.  Right Ear: External ear normal.  Left Ear: External ear normal.  Eyes: Conjunctivae normal are normal. Pupils are equal, round, and reactive to light.  Neck: Normal range of motion. Neck supple.  Cardiovascular: Normal rate and normal heart sounds.   Pulmonary/Chest: Effort normal and breath sounds normal.  Abdominal: Soft.  Musculoskeletal: Normal range of motion.       Tender ls spine diffusely  Neurological: She is alert and oriented to person, place, and time.  Skin: Skin is warm.  Psychiatric: She has a normal mood and affect.    ED Course  Procedures (including critical care time)  Labs Reviewed - No data to display No results found.   No diagnosis found.    MDM  Pt given rx for percocet 16.   Pt advised to see her Physicain for recheck next week.        [place], [place] 06/24/12 [phone]

## 2012-07-04 DIAGNOSIS — M545 Low back pain: Secondary | ICD-10-CM | POA: Diagnosis not present

## 2012-07-12 ENCOUNTER — Encounter (HOSPITAL_COMMUNITY): Payer: Self-pay

## 2012-07-12 ENCOUNTER — Emergency Department (HOSPITAL_COMMUNITY): Payer: Medicare Other

## 2012-07-12 ENCOUNTER — Emergency Department (HOSPITAL_COMMUNITY)
Admission: EM | Admit: 2012-07-12 | Discharge: 2012-07-12 | Disposition: A | Discharge status: Home / Self Care | Payer: Medicare Other | Attending: Emergency Medicine | Admitting: Emergency Medicine

## 2012-07-12 DIAGNOSIS — I10 Essential (primary) hypertension: Secondary | ICD-10-CM | POA: Diagnosis not present

## 2012-07-12 DIAGNOSIS — M545 Low back pain, unspecified: Secondary | ICD-10-CM | POA: Diagnosis not present

## 2012-07-12 DIAGNOSIS — R05 Cough: Secondary | ICD-10-CM | POA: Diagnosis not present

## 2012-07-12 DIAGNOSIS — R059 Cough, unspecified: Secondary | ICD-10-CM | POA: Insufficient documentation

## 2012-07-12 DIAGNOSIS — G8929 Other chronic pain: Secondary | ICD-10-CM | POA: Diagnosis not present

## 2012-07-12 DIAGNOSIS — Z862 Personal history of diseases of the blood and blood-forming organs and certain disorders involving the immune mechanism: Secondary | ICD-10-CM | POA: Insufficient documentation

## 2012-07-12 DIAGNOSIS — K219 Gastro-esophageal reflux disease without esophagitis: Secondary | ICD-10-CM | POA: Diagnosis not present

## 2012-07-12 DIAGNOSIS — Z79899 Other long term (current) drug therapy: Secondary | ICD-10-CM | POA: Diagnosis not present

## 2012-07-12 DIAGNOSIS — F17200 Nicotine dependence, unspecified, uncomplicated: Secondary | ICD-10-CM | POA: Diagnosis not present

## 2012-07-12 DIAGNOSIS — M549 Dorsalgia, unspecified: Secondary | ICD-10-CM

## 2012-07-12 DIAGNOSIS — Z853 Personal history of malignant neoplasm of breast: Secondary | ICD-10-CM | POA: Diagnosis not present

## 2012-07-12 DIAGNOSIS — Z8739 Personal history of other diseases of the musculoskeletal system and connective tissue: Secondary | ICD-10-CM | POA: Insufficient documentation

## 2012-07-12 DIAGNOSIS — IMO0001 Reserved for inherently not codable concepts without codable children: Secondary | ICD-10-CM | POA: Diagnosis not present

## 2012-07-12 DIAGNOSIS — Z8709 Personal history of other diseases of the respiratory system: Secondary | ICD-10-CM | POA: Insufficient documentation

## 2012-07-12 DIAGNOSIS — Z7982 Long term (current) use of aspirin: Secondary | ICD-10-CM | POA: Insufficient documentation

## 2012-07-12 DIAGNOSIS — Z8619 Personal history of other infectious and parasitic diseases: Secondary | ICD-10-CM | POA: Diagnosis not present

## 2012-07-12 DIAGNOSIS — Z87448 Personal history of other diseases of urinary system: Secondary | ICD-10-CM | POA: Insufficient documentation

## 2012-07-12 MED ORDER — OXYCODONE-ACETAMINOPHEN 5-325 MG PO TABS: 2.0000 | ORAL_TABLET | ORAL | Status: DC | PRN

## 2012-07-12 MED ORDER — OXYCODONE-ACETAMINOPHEN 5-325 MG PO TABS
1.0000 | ORAL_TABLET | Freq: Once | ORAL | Status: AC
  Administered 2012-07-12: 1 via ORAL
  Filled 2012-07-12: qty 1

## 2012-07-12 NOTE — ED Provider Notes (Signed)
History     CSN: [id number]  Arrival date & time 07/12/12  [id number]   First MD Initiated Contact with Patient 07/12/12 [phone]      Chief Complaint  Patient presents with  . Back Pain  . Cough    HPI Pt is a 62 yo F presenting with back pain and cough. Pt states her cough started "many months" ago and has been unchanged. She is a smoker. Denies fever, congestion, sore throat or SOB. She describes the cough as a dry cough and she does not cough anything up. She had a cough similar to this in the past that resolved on its own. Of note, she is on Lisinopril for HTN. She also has a known history of spinal stenosis. Her back pain is lumbar at site of prior operation. She had a normal X-ray 2 weeks ago. She has been taking percocet for the pain, which does help some but does not take pain away. She states the pain is worse with her cough, and better with rest. She denies loss of bowel/bladder, numbness/tingling or any radiation of her pain.   Past Medical History  Diagnosis Date  . GERD (gastroesophageal reflux disease)   . Hepatitis C   . Hypertension   . Low back pain   . Osteoarthritis   . Uterine fibroid   . Normocytic anemia     With thrombocytosis  . Right ureteral stone 2002  . Atelectasis 2002    Bilateral  . Bone spur 2008    Right calcaneal foot spur  . Lumbar spinal stenosis     S/P lumbar decompressive laminectomy, fusion and plating for lumbar spinal stensosis  . Breast cancer 2004    Ductal carcinoma in situ of the left breast; S/P left partial mastectomy 02/26/2003; S/P re-excision of cranial and lateral margins11/18/2004.    Past Surgical History  Procedure Laterality Date  . Laminectomy  05/27/2009    Lumbar decompressive laminectomy, fusion and plating for lumbar spinal stensosis  . Mastectomy, partial  02/26/2003    S/P left partial mastectomy 02/26/2003; S/P re-excision of cranial and lateral margins11/18/2004.   . Back surgery    . Breast lumpectomy      Family  History  Problem Relation Age of Onset  . Colon cancer Mother 48  . Cancer Mother   . Hypertension Mother   . Diabetes Sister 41  . Hypertension Sister   . Breast cancer Neg Hx   . Cervical cancer Neg Hx   . Diabetes Brother   . Hypertension Brother   . Diabetes Brother   . Hypertension Brother   . Kidney disease Son     On dialysis  . Hypertension Son   . Diabetes Son   . Multiple sclerosis Son     History  Substance Use Topics  . Smoking status: Current Some Day Smoker -- 0.20 packs/day for 32 years    Types: Cigarettes  . Smokeless tobacco: Never Used  . Alcohol Use: 0.0 oz/week    1-2 Cans of beer per week     Comment: Occasional     OB History   Grav Para Term Preterm Abortions TAB SAB Ect Mult Living   2 2 1 1             Review of Systems  Constitutional: Negative for fever, chills and activity change.  HENT: Negative for congestion, sore throat, trouble swallowing and neck stiffness.   Respiratory: Positive for cough. Negative for shortness of breath.  Cardiovascular: Negative for chest pain.  Gastrointestinal: Negative for abdominal pain.  Genitourinary: Negative for difficulty urinating.  Musculoskeletal: Positive for myalgias, back pain and arthralgias.  Skin: Negative for rash.  Hematological: Negative for adenopathy.  All other systems reviewed and are negative.    Allergies  Review of patient's allergies indicates no known allergies.  Home Medications   Current Outpatient Rx  Name  Route  Sig  Dispense  Refill  . albuterol (PROVENTIL HFA;VENTOLIN HFA) 108 (90 BASE) MCG/ACT inhaler   Inhalation   Inhale 2 puffs into the lungs every 6 (six) hours as needed. For shortness of breath         . amLODipine (NORVASC) 10 MG tablet   Oral   Take 1 tablet (10 mg total) by mouth daily.   90 tablet   2   . aspirin 325 MG tablet   Oral   Take 1 tablet (325 mg total) by mouth daily.   100 tablet   3   . B Complex Vitamins (VITAMIN B COMPLEX  PO)   Oral   Take 1 tablet by mouth daily.           . cetirizine (ZYRTEC) 10 MG tablet   Oral   Take 1 tablet (10 mg total) by mouth daily.   30 tablet   11   . cloNIDine (CATAPRES) 0.1 MG tablet   Oral   Take 1 tablet (0.1 mg total) by mouth 2 (two) times daily.   60 tablet   11   . famotidine (PEPCID) 20 MG tablet   Oral   Take 1 tablet (20 mg total) by mouth at bedtime.   30 tablet   11   . ibuprofen (ADVIL,MOTRIN) 200 MG tablet   Oral   Take 600 mg by mouth every 6 (six) hours as needed for pain.         . Multiple Vitamin (MULTIVITAMIN WITH MINERALS) TABS   Oral   Take 1 tablet by mouth daily.         Marland Kitchen omeprazole (PRILOSEC) 40 MG capsule   Oral   Take 1 capsule (40 mg total) by mouth daily.   30 capsule   11   . traMADol (ULTRAM) 50 MG tablet   Oral   Take 1 tablet (50 mg total) by mouth every 6 (six) hours as needed for pain.   90 tablet   3   . zolpidem (AMBIEN) 5 MG tablet   Oral   Take 1 tablet (5 mg total) by mouth at bedtime as needed for sleep.   30 tablet   2   . oxyCODONE-acetaminophen (PERCOCET/ROXICET) 5-325 MG per tablet   Oral   Take 2 tablets by mouth every 4 (four) hours as needed for pain.   10 tablet   0     BP 149/98  Pulse 60  Temp(Src) 98.1 F (36.7 C) (Oral)  Resp 12  SpO2 100%  Physical Exam  Constitutional: She is oriented to person, place, and time. She appears well-developed and well-nourished. No distress.  HENT:  Head: Normocephalic and atraumatic.  Eyes: Pupils are equal, round, and reactive to light.  Neck: Normal range of motion. Neck supple.  Cardiovascular: Normal rate, regular rhythm and normal heart sounds.   Pulmonary/Chest: Effort normal and breath sounds normal. No respiratory distress. She has no wheezes. She has no rales.  Abdominal: Soft. There is no tenderness.  Musculoskeletal:       Lumbar back: She exhibits tenderness, bony  tenderness and pain. She exhibits normal range of motion, no  swelling and no edema.  Diffuse TTP of lumbar spine and paraspinal muscles. She has changes from old surgery  Lymphadenopathy:    She has no cervical adenopathy.  Neurological: She is alert and oriented to person, place, and time. No cranial nerve deficit.  Skin: Skin is warm and dry. No rash noted. She is not diaphoretic.  Psychiatric: She has a normal mood and affect. Her behavior is normal.    ED Course  Procedures (including critical care time)  Labs Reviewed - No data to display Dg Chest 2 View  07/12/2012  *RADIOLOGY REPORT*  Clinical Data: Back pain and cough  CHEST - 2 VIEW  Comparison: Chest radiograph 05/30/2011  Findings: Stable mild cardiomegaly.  Slightly tortuous thoracic aorta contour.  Hilar contours normal.  The trachea is midline. The lungs are normally expanded and clear.  The pulmonary vascularity is normal.  There is no pleural effusion, airspace disease, or pneumothorax.  There are typical degenerative changes of the thoracic spine.  No acute or suspicious bony abnormality.  IMPRESSION: Cardiomegaly.  No acute cardiopulmonary disease identified.   Original Report Authenticated By: Curlene Dolphin, M.D.     1. Chronic back pain   2. Cough     MDM  63 yo F with chronic cough and chronic back pain, unchanged.  Patient seen and examined. Appears comfortable. Cough could be due to habitual cough, smoking, ACE-I use, or allergic type symptoms. No red flag symptoms or indication for antibiotic use. For her back pain, she continues to have chronic lumbar pain with no red flags.  CXR within normal limits. Will recommend she stop her Lisinopril and follow up with PCP about blood pressure. She can use normal saline nasal spray for moisture. She was also advised to stop smoking. For back pain, her coughing is most likely worsening her back pain. She should follow up with surgeon who follows her back pain. Given Percocet #10 until she can see her doctor. Discharged home in stable  condition.   Montez Morita, MD 07/12/12 1048

## 2012-07-12 NOTE — ED Provider Notes (Signed)
I saw and evaluated the patient, reviewed the resident's note and I agree with the findings and plan.   .Face to face Exam:  General:  Awake HEENT:  Atraumatic Resp:  Normal effort Abd:  Nondistended Neuro:No focal weakness Lymph: No adenopathy   Dot Lanes, MD 07/12/12 1049

## 2012-07-12 NOTE — ED Notes (Signed)
Pt states she has seen MD several times for dry cough ongoing x 3 months.  Pt also c/o chronic back pain and has hx of surgery in 2010.

## 2012-07-18 ENCOUNTER — Ambulatory Visit
Admission: RE | Admit: 2012-07-18 | Discharge: 2012-07-18 | Disposition: A | Discharge status: Home / Self Care | Payer: Medicare Other | Source: Ambulatory Visit | Attending: Internal Medicine | Admitting: Internal Medicine

## 2012-07-18 DIAGNOSIS — Z1231 Encounter for screening mammogram for malignant neoplasm of breast: Secondary | ICD-10-CM

## 2012-07-19 ENCOUNTER — Other Ambulatory Visit: Payer: Self-pay | Admitting: Internal Medicine

## 2012-07-19 DIAGNOSIS — N6459 Other signs and symptoms in breast: Secondary | ICD-10-CM

## 2012-07-20 ENCOUNTER — Ambulatory Visit: Payer: Medicare Other | Admitting: Internal Medicine

## 2012-08-10 ENCOUNTER — Ambulatory Visit: Payer: Medicare Other | Admitting: Internal Medicine

## 2012-09-08 ENCOUNTER — Ambulatory Visit (INDEPENDENT_AMBULATORY_CARE_PROVIDER_SITE_OTHER): Payer: Medicare Other | Admitting: Internal Medicine

## 2012-09-08 ENCOUNTER — Encounter: Payer: Self-pay | Admitting: Internal Medicine

## 2012-09-08 ENCOUNTER — Telehealth: Payer: Self-pay | Admitting: *Deleted

## 2012-09-08 VITALS — BP 158/100 | HR 81 | Temp 97.9°F | Ht 64.0 in | Wt 132.4 lb

## 2012-09-08 DIAGNOSIS — I10 Essential (primary) hypertension: Secondary | ICD-10-CM

## 2012-09-08 DIAGNOSIS — F17200 Nicotine dependence, unspecified, uncomplicated: Secondary | ICD-10-CM | POA: Diagnosis not present

## 2012-09-08 DIAGNOSIS — M545 Low back pain, unspecified: Secondary | ICD-10-CM | POA: Diagnosis not present

## 2012-09-08 MED ORDER — TRAMADOL HCL 50 MG PO TABS: 50.0000 mg | ORAL_TABLET | Freq: Four times a day (QID) | ORAL | Status: DC | PRN

## 2012-09-08 MED ORDER — ALBUTEROL SULFATE HFA 108 (90 BASE) MCG/ACT IN AERS: 2.0000 | INHALATION_SPRAY | Freq: Four times a day (QID) | RESPIRATORY_TRACT | Status: DC | PRN

## 2012-09-08 MED ORDER — LOSARTAN POTASSIUM 25 MG PO TABS: 25.0000 mg | ORAL_TABLET | Freq: Every day | ORAL | Status: DC

## 2012-09-08 MED ORDER — LIDOCAINE 5 % EX PTCH: 1.0000 | MEDICATED_PATCH | CUTANEOUS | Status: DC

## 2012-09-08 NOTE — Assessment & Plan Note (Addendum)
Pertinent Data: BP Readings from Last 3 Encounters:  09/08/12 158/100  07/12/12 149/98  06/24/12 [id number]    Basic Metabolic Panel:    Component Value Date/Time   NA 139 04/20/2012 1220   K 3.5 04/20/2012 1220   CL 101 04/20/2012 1220   CO2 29 04/20/2012 1220   BUN 22 04/20/2012 1220   CREATININE 1.66* 04/20/2012 1220   CREATININE 1.36* 12/18/2009 1918   GLUCOSE 79 04/20/2012 1220   CALCIUM 9.9 04/20/2012 1220    Assessment: Disease Control:   uncontrolled   Progress toward goals:   unchanged   Barriers to meeting goals: Taking amlodipine daily without missing. But taking clonidine once daily at 0.2 mg instead of BID    She was started on lisinopril in 04/2012, which was then discontinued and the subsequent months by the ED physicians secondary to cough thought to be associated with ACE inhibitor usage. Her cough has improved since continuing the lisinopril.  She does have CKD 3, therefore, I do feel that she would benefit from ACE inhibitor or ARB. Given that she had cough resulted from lisinopril, we will attempt to add an ARB.  Patient is compliant most of the time with prescribed medications.   Plan:  Continue amlodipine 10 mg daily, change clonidine back to 0.1 mg twice a day instead of once daily.  Will add losartan 25 mg daily.  Will have her come back in 2-3 weeks for repeat blood pressure check and BMET to evaluate renal function.  Recommended to decrease her tobacco usage.  Educational resources provided:  handout  Self management tools provided:

## 2012-09-08 NOTE — Addendum Note (Signed)
Addended by: Annamarie Dawley on: 09/08/2012 01:32 PM   Modules accepted: Orders, Medications

## 2012-09-08 NOTE — Assessment & Plan Note (Signed)
Pertinent Data: S/P lumbar decompressive laminectomy, fusion, and plating for lumbar spinal stenosis on 05/27/2009 by Dr. Eustace Moore  Assessment: Agent has tenderness to palpation over prior surgical site with hyperesthesias over this area. Suggestive possibly of postlaminectomy syndrome versus a neuropathic pain. Given that it is very focal, I do think she may benefit from a pain patch. She states she's previously had physical therapy for 3-4 months last year, which she did not find to be effective. She had gone ED in January and February for this pain and was given Percocet which she requests. However, she was explained that I will not be filling percocet for her, because she said taking 3-4 tramadol a day does help the pain. I do not feel comfortable starting her on the road of chronic strong opiate usage as I am just meeting this patient. I therefore, will defer this decision to her PCP - who I expect will likely want her to continue with present tramadol if it is effective.  No new or worsening of symptoms from her baseline chronic back pain.  Plan:      Start lidocaine patch.  Fill given for tramadol 50 mg one tablet every 6 hours when necessary pain #120 without refills.  Defer physical therapy for now as the patient has not found it to be effective and does not want to go at this point.  The patient was informed of the red flag symptoms that should prompt her immediate return for reevaluation - such as, but not limited to the following: New leg weakness, paresthesias, perineal numbness, urinary or stool incontinence, worsening pain. She verbally expresses understanding of the information provided.

## 2012-09-08 NOTE — Telephone Encounter (Signed)
Changed. Thank you! - Chilton Greathouse, D.O., 09/08/2012, 4:50 PM

## 2012-09-08 NOTE — Assessment & Plan Note (Signed)
  Assessment: 1. The patient was counseled on the dangers of tobacco use, which include, but are not limited to cardiovascular disease, increased cancer risk of multiple types of cancer, COPD, peripheral vascular disease, strokes. 2. She was also counseled on the benefits of smoking cessation. 3. Progress toward smoking cessation:    still smoking about the same 4. Barriers to progress toward smoking cessation:    habit  Plan: 1. Instruction/counseling given: The patient was firmly advised to quit and referred to a tobacco cessation program.   2. We also reviewed strategies to maximize success, including:  Removing cigarettes and smoking materials from environment  Stress management  Substitution of other forms of reinforcement Support of family/friends.  Selecting a quit date. 3. Educational resources provided:   4. Self management tools provided:  handout 5. Medications to assist with smoking cessation: None 6. Patient agreed to the following self-care plans for smoking cessation:  start thinking about decreasing cigarette usage.

## 2012-09-08 NOTE — Telephone Encounter (Signed)
Fax from [employer] will not cover Proventil but will pay for ProAir.Thanks

## 2012-09-08 NOTE — Progress Notes (Signed)
Patient: Monique Henderson   MRN: [id number]  DOB: 30-Apr-1951  PCP: Axel Filler, MD   Subjective:    HPI: Ms. [name] is a 62 y.o. female with a PMHx as outlined below, who presented to clinic today for the following:  1) HTN - Patient does check blood pressure regularly at home - 180/100s mmHg. Currently taking amlodipine 10 mg and Clonidine 0.1mg  BID (taking . Previously had cough with lisinopril. Patient misses doses 0 x per week on average. denies headaches, dizziness, lightheadedness, chest pain, shortness of breath.  does request refills today.   2) Back pain - Patient describes a 12 years history of stable, dull and and stinging back pain located over left lumbar area or radiating to left lateral leg(s). Currently rated 10/10 in severity. At its worst, the pain is rated 10/10 in severity. Aggravating factors include: physical activity and standing or sitting for prolonged time. Alleviating factors include: heat and rest. Patient confirms no other symptoms, history of breat cancer in204 s/p partial mastectomy. Denies symptoms of new numbness, new weakness, new tingling, history of osteoporosis, history of steroid use.   Review of Systems: Per HPI.   Current Outpatient Medications: Medication Sig  . albuterol (PROVENTIL HFA;VENTOLIN HFA) 108 (90 BASE) MCG/ACT inhaler Inhale 2 puffs into the lungs every 6 (six) hours as needed. For shortness of breath  . amLODipine (NORVASC) 10 MG tablet Take 1 tablet (10 mg total) by mouth daily.  Marland Kitchen aspirin 325 MG tablet Take 1 tablet (325 mg total) by mouth daily.  . B Complex Vitamins (VITAMIN B COMPLEX PO) Take 1 tablet by mouth daily.    . cetirizine (ZYRTEC) 10 MG tablet Take 1 tablet (10 mg total) by mouth daily.  . cloNIDine (CATAPRES) 0.1 MG tablet Take 1 tablet (0.1 mg total) by mouth 2 (two) times daily.  . famotidine (PEPCID) 20 MG tablet Take 1 tablet (20 mg total) by mouth at bedtime.  Marland Kitchen ibuprofen (ADVIL,MOTRIN) 200 MG  tablet Take 600 mg by mouth every 6 (six) hours as needed for pain.  . Multiple Vitamin (MULTIVITAMIN WITH MINERALS) TABS Take 1 tablet by mouth daily.  Marland Kitchen omeprazole (PRILOSEC) 40 MG capsule Take 1 capsule (40 mg total) by mouth daily.  . traMADol (ULTRAM) 50 MG tablet Take 1 tablet (50 mg total) by mouth every 6 (six) hours as needed for pain.  Marland Kitchen zolpidem (AMBIEN) 5 MG tablet Take 1 tablet (5 mg total) by mouth at bedtime as needed for sleep.   Allergies: No Known Allergies  Past Medical History  Diagnosis Date  . GERD (gastroesophageal reflux disease)   . Hepatitis C   . Hypertension   . Low back pain   . Osteoarthritis   . Uterine fibroid   . Normocytic anemia     With thrombocytosis  . Right ureteral stone 2002  . Atelectasis 2002    Bilateral  . Bone spur 2008    Right calcaneal foot spur  . Lumbar spinal stenosis     S/P lumbar decompressive laminectomy, fusion and plating for lumbar spinal stensosis  . Breast cancer 2004    Ductal carcinoma in situ of the left breast; S/P left partial mastectomy 02/26/2003; S/P re-excision of cranial and lateral margins11/18/2004.     Objective:    Physical Exam: Filed Vitals:   09/08/12 0832  BP: 158/100  Pulse: 81  Temp: 97.9 F (36.6 C)    General: Vital signs reviewed and noted. Well-developed, well-nourished, in no  acute distress; alert, appropriate and cooperative throughout examination.  Head: Normocephalic, atraumatic.  Lungs:  Normal respiratory effort. Clear to auscultation BL without crackles or wheezes.  Heart: RRR. S1 and S2 normal without gallop, rubs. No murmur.  Abdomen:  BS normoactive. Soft, Nondistended, non-tender.  No masses or organomegaly.  Extremities: No pretibial edema. Negative straight leg raise bilaterally, light touch sensation intact bilaterally. 5/5 muscle strength bilateral lower extremities.  Back - tenderness to palpation and hyperesthesia over lumbar spine surrounding prior laminectomy site. No  joint redness, swelling, erythema, warmth.    Assessment/ Plan:   The patient's case and plan of care was discussed with attending physician, Dr. Madilyn Fireman.

## 2012-09-08 NOTE — Patient Instructions (Signed)
General Instructions:  Please follow-up at the clinic in 2-3 weeks, at which time we will reevaluate your blood pressure and pain - OR, please follow-up in the clinic sooner if needed.  There have been changes in your medications.  START lidocaine patch once daily over your low back  CONTINUE tramadol - you can use up to a max of every 6 hours - You have been started on a new medication that can cause drowsiness, do not drive or operate heavy machinery . Do not take this medication with alcohol.   START Losartan for you blood pressure - once a day.  CHANGE your clonidine to twice a day for your blood pressure.   If you have been started on new medication(s), and you develop symptoms concerning for allergic reaction, including, but not limited to, throat closing, tongue swelling, rash, please stop the medication immediately and call the clinic at [phone], and go to the ER.  If you are diabetic, please bring your meter to your next visit.  If symptoms worsen, or new symptoms arise, please call the clinic or go to the ER.  PLEASE BRING ALL OF YOUR MEDICATIONS  IN A BAG TO YOUR NEXT APPOINTMENT   Treatment Goals:  Goals (1 Years of Data) as of 09/08/12         As of Today 07/12/12 07/12/12 06/24/12 04/20/12     Blood Pressure    . Blood Pressure < 140/90  158/100 149/98 165/115 179/107 160/94      Progress Toward Treatment Goals:    Self Care Goals & Plans:  Self Care Goal 09/08/2012  Manage my medications take my medicines as prescribed; bring my medications to every visit; refill my medications on time  Monitor my health keep track of my blood pressure; bring my blood pressure log to each visit  Eat healthy foods drink diet soda or water instead of juice or soda; eat more vegetables; eat foods that are low in salt; eat baked foods instead of fried foods  Be physically active find an activity I enjoy     LIFESTYLE TIPS TO HELP WITH YOUR BLOOD PRESSURE CONTROL  WEIGHT  REDUCTION:  Strategies: A healthy weight loss program includes:  A calorie restricted diet based on individual calorie needs.   Increased physical activity (exercise).  An exercise program is just as important as the right low-calorie diet.    An unhealthy weight loss program includes:  Fasting.   Fad diets.   Supplements and drugs.  These choices do not succeed in long-term weight control.   Home Care Instructions: To help you make the needed dietary changes:   Exercise and perform physical activity as directed by your caregiver.   Keep a daily record of everything you eat. There are many free websites to help you with this. It may be helpful to measure your foods so you can determine if you are eating the correct portion sizes.   Use low-calorie cookbooks or take special cooking classes.   Avoid alcohol. Drink more water and drinks with no calories.   Take vitamins and supplements only as recommended by your caregiver.   Weight loss support groups, Registered Dieticians, counselors, and stress reduction education can also be very helpful.   ________________________________________________________________________  DASH DIET:  The DASH diet stands for "Dietary Approaches to Stop Hypertension." It is a healthy eating plan that has been shown to reduce high blood pressure (hypertension) in as little as 14 days, while also possibly providing other significant  health benefits. These other health benefits include reducing the risk of breast cancer after menopause and reducing the risk of type 2 diabetes, heart disease, colon cancer, and stroke. Health benefits also include weight loss and slowing kidney failure in patients with chronic kidney disease.   Diet guidelines: Limit salt (sodium). Your diet should contain less than 1500 mg of sodium daily.  Limit refined or processed carbohydrates. Your diet should include mostly whole grains. Desserts and added sugars should be used  sparingly.  Include small amounts of heart-healthy fats. These types of fats include nuts, oils, and tub margarine. Limit saturated and trans fats. These fats have been shown to be harmful in the body.   Choosing Foods: The following food groups are based on a 2000 calorie diet. See your Registered Dietitian for individual calorie needs.  Grains and Grain Products (6 to 8 servings daily)  Eat More Often: Whole-wheat bread, brown rice, whole-grain or wheat pasta, quinoa, popcorn without added fat or salt (air popped).  Eat Less Often: White bread, white pasta, white rice, cornbread.  Vegetables (4 to 5 servings daily)  Eat More Often: Fresh, frozen, and canned vegetables. Vegetables may be raw, steamed, roasted, or grilled with a minimal amount of fat.  Eat Less Often/Avoid: Creamed or fried vegetables. Vegetables in a cheese sauce.  Fruit (4 to 5 servings daily)  Eat More Often: All fresh, canned (in natural juice), or frozen fruits. Dried fruits without added sugar. One hundred percent fruit juice ( cup [237 mL] daily).  Eat Less Often: Dried fruits with added sugar. Canned fruit in light or heavy syrup.  [employer], Fish, and Poultry (2 servings or less daily. One serving is 3 to 4 oz [85-114 g]).  Eat More Often: Ninety percent or leaner ground beef, tenderloin, sirloin. Round cuts of beef, chicken breast, [place] breast. All fish. Grill, bake, or broil your meat. Nothing should be fried.  Eat Less Often/Avoid: Fatty cuts of meat, [place], or chicken leg, thigh, or wing. Fried cuts of meat or fish.  Dairy (2 to 3 servings)  Eat More Often: Low-fat or fat-free milk, low-fat plain or light yogurt, reduced-fat or part-skim cheese.  Eat Less Often/Avoid: Milk (whole, 2%, skim, or chocolate). Whole milk yogurt. Full-fat cheeses.  Nuts, Seeds, and Legumes (4 to 5 servings per week)  Eat More Often: All without added salt.  Eat Less Often/Avoid: Salted nuts and seeds, canned beans with added salt.   Fats and Sweets (limited)  Eat More Often: Vegetable oils, tub margarines without trans fats, sugar-free gelatin. Mayonnaise and salad dressings.  Eat Less Often/Avoid: Coconut oils, palm oils, butter, stick margarine, cream, half and half, cookies, candy, pie.   ________________________________________________________________________  Smoking Cessation Tips 1-800-QUIT-NOW  This document explains the best ways for you to quit smoking and new treatments to help. It lists new medicines that can double or triple your chances of quitting and quitting for good. It also considers ways to avoid relapses and concerns you may have about quitting, including weight gain.   Nicotine: A Powerful Addiction If you have tried to quit smoking, you know how hard it can be. It is hard because nicotine is a very addictive drug. For some people, it can be as addictive as heroin or cocaine. Usually, people make 2 or 3 tries, or more, before finally being able to quit. Each time you try to quit, you can learn about what helps and what hurts. Quitting takes hard work and a lot of  effort, but you can quit smoking.   Quitting smoking is one of the most important things you will ever do You will live longer, feel better, and live better.  The impact on your body of quitting smoking is felt almost immediately:   Five keys to quitting: Studies have shown that these 5 steps will help you quit smoking and quit for good. You have the best chances of quitting if you use them together:   1. GET READY  Set a quit date.  Change your environment.  Get rid of ALL cigarettes, ashtrays, matches, and lighters in your home, car, and place of work.  Do not let people smoke in your home.  Review your past attempts to quit. Think about what worked and what did not.  Once you quit, do not smoke. NOT EVEN A PUFF!   2. GET SUPPORT AND ENCOURAGEMENT  Tell your family, friends, and coworkers that you are going to quit and need their  support. Ask them not to smoke around you.  Get individual, group, or telephone counseling and support.  Many smokers find one or more of the many self-help books available useful in helping them quit and stay off tobacco.   3. LEARN NEW SKILLS AND BEHAVIORS  Try to distract yourself from urges to smoke. Talk to someone, go for a walk, or occupy your time with a task.  When you first try to quit, change your routine. Take a different route to work. Drink tea instead of coffee. Eat breakfast in a different place.  Do something to reduce your stress. Take a hot bath, exercise, or read a book.  Plan something enjoyable to do every day. Reward yourself for not smoking.  Explore interactive web-based programs that specialize in helping you quit.   4. GET MEDICINE AND USE IT CORRECTLY .  Medicines can help you stop smoking and decrease the urge to smoke. Combining medicine with the above behavioral methods and support can quadruple your chances of successfully quitting smoking.  Talk with your doctor about these options.  5. BE PREPARED FOR RELAPSE OR DIFFICULT SITUATIONS  Most relapses occur within the first 3 months after quitting. Do not be discouraged if you start smoking again. Remember, most people try several times before they finally quit.  You may have symptoms of withdrawal because your body is used to nicotine. You may crave cigarettes, be irritable, feel very hungry, cough often, get headaches, or have difficulty concentrating.  The withdrawal symptoms are only temporary. They are strongest when you first quit, but they will go away within 10 to 14 days.   Quitting takes hard work and a lot of effort, but you can quit smoking.   FOR MORE INFORMATION  Smokefree.gov (Inrails.tn) provides free, accurate, evidence-based information and professional assistance to help support the immediate and long-term needs of people trying to quit smoking.  Document Released: 05/12/2001  Document Re-Released: 11/05/2009  [facility] Patient Information 2011 [place].

## 2012-09-12 ENCOUNTER — Telehealth: Payer: Self-pay | Admitting: *Deleted

## 2012-09-12 MED ORDER — ALBUTEROL SULFATE HFA 108 (90 BASE) MCG/ACT IN AERS: 2.0000 | INHALATION_SPRAY | Freq: Four times a day (QID) | RESPIRATORY_TRACT | Status: DC | PRN

## 2012-09-12 NOTE — Progress Notes (Signed)
I have discussed this case with Dr. Doy Mince , read the documentation and I agree with the plan of care. Please see the resident note for details of management.

## 2012-09-12 NOTE — Telephone Encounter (Signed)
Pharmacy called and states Proventil HFA is not covered under pt's insurance - would like to change to  [employer]. Needs new Rx to [facility] .Hilda Blades Ditzker RN 09/12/12 2:50PM

## 2012-09-14 ENCOUNTER — Ambulatory Visit
Admission: RE | Admit: 2012-09-14 | Discharge: 2012-09-14 | Disposition: A | Discharge status: Home / Self Care | Payer: Medicare Other | Source: Ambulatory Visit | Attending: Internal Medicine | Admitting: Internal Medicine

## 2012-09-14 ENCOUNTER — Other Ambulatory Visit: Payer: Self-pay | Admitting: Internal Medicine

## 2012-09-14 DIAGNOSIS — R921 Mammographic calcification found on diagnostic imaging of breast: Secondary | ICD-10-CM

## 2012-09-14 DIAGNOSIS — R920 Mammographic microcalcification found on diagnostic imaging of breast: Secondary | ICD-10-CM | POA: Diagnosis not present

## 2012-09-14 DIAGNOSIS — N6459 Other signs and symptoms in breast: Secondary | ICD-10-CM

## 2012-09-14 DIAGNOSIS — L299 Pruritus, unspecified: Secondary | ICD-10-CM | POA: Diagnosis not present

## 2012-09-21 ENCOUNTER — Ambulatory Visit
Admission: RE | Admit: 2012-09-21 | Discharge: 2012-09-21 | Disposition: A | Discharge status: Home / Self Care | Payer: Medicare Other | Source: Ambulatory Visit | Attending: Internal Medicine | Admitting: Internal Medicine

## 2012-09-21 ENCOUNTER — Other Ambulatory Visit: Payer: Self-pay | Admitting: Internal Medicine

## 2012-09-21 DIAGNOSIS — R920 Mammographic microcalcification found on diagnostic imaging of breast: Secondary | ICD-10-CM | POA: Diagnosis not present

## 2012-09-21 DIAGNOSIS — R921 Mammographic calcification found on diagnostic imaging of breast: Secondary | ICD-10-CM

## 2012-09-21 DIAGNOSIS — D0590 Unspecified type of carcinoma in situ of unspecified breast: Secondary | ICD-10-CM | POA: Diagnosis not present

## 2012-09-22 ENCOUNTER — Other Ambulatory Visit: Payer: Self-pay | Admitting: Internal Medicine

## 2012-09-22 ENCOUNTER — Ambulatory Visit
Admission: RE | Admit: 2012-09-22 | Discharge: 2012-09-22 | Disposition: A | Discharge status: Home / Self Care | Payer: Medicare Other | Source: Ambulatory Visit | Attending: Internal Medicine | Admitting: Internal Medicine

## 2012-09-22 DIAGNOSIS — R921 Mammographic calcification found on diagnostic imaging of breast: Secondary | ICD-10-CM

## 2012-09-22 DIAGNOSIS — C50911 Malignant neoplasm of unspecified site of right female breast: Secondary | ICD-10-CM

## 2012-09-22 DIAGNOSIS — D0590 Unspecified type of carcinoma in situ of unspecified breast: Secondary | ICD-10-CM | POA: Diagnosis not present

## 2012-09-23 ENCOUNTER — Telehealth: Payer: Self-pay | Admitting: *Deleted

## 2012-09-23 DIAGNOSIS — C50311 Malignant neoplasm of lower-inner quadrant of right female breast: Secondary | ICD-10-CM

## 2012-09-23 NOTE — Telephone Encounter (Signed)
Confirmed [facility] for 09/28/12 at 0800.  Instructions and contact information given.

## 2012-09-27 ENCOUNTER — Other Ambulatory Visit: Payer: Self-pay | Admitting: *Deleted

## 2012-09-27 ENCOUNTER — Ambulatory Visit
Admission: RE | Admit: 2012-09-27 | Discharge: 2012-09-27 | Disposition: A | Discharge status: Home / Self Care | Payer: Medicare Other | Source: Ambulatory Visit | Attending: Internal Medicine | Admitting: Internal Medicine

## 2012-09-27 DIAGNOSIS — C50911 Malignant neoplasm of unspecified site of right female breast: Secondary | ICD-10-CM

## 2012-09-27 DIAGNOSIS — D0590 Unspecified type of carcinoma in situ of unspecified breast: Secondary | ICD-10-CM | POA: Diagnosis not present

## 2012-09-27 MED ORDER — AMLODIPINE BESYLATE 10 MG PO TABS: 10.0000 mg | ORAL_TABLET | Freq: Every day | ORAL | Status: DC

## 2012-09-27 NOTE — Telephone Encounter (Signed)
Rx called in to pharmacy. 

## 2012-09-28 ENCOUNTER — Encounter: Payer: Self-pay | Admitting: *Deleted

## 2012-09-28 ENCOUNTER — Encounter: Payer: Self-pay | Admitting: Oncology

## 2012-09-28 ENCOUNTER — Ambulatory Visit (HOSPITAL_BASED_OUTPATIENT_CLINIC_OR_DEPARTMENT_OTHER): Payer: Medicare Other | Admitting: Oncology

## 2012-09-28 ENCOUNTER — Ambulatory Visit (HOSPITAL_BASED_OUTPATIENT_CLINIC_OR_DEPARTMENT_OTHER): Payer: Medicare Other | Admitting: General Surgery

## 2012-09-28 ENCOUNTER — Other Ambulatory Visit (HOSPITAL_BASED_OUTPATIENT_CLINIC_OR_DEPARTMENT_OTHER): Payer: Medicare Other | Admitting: Lab

## 2012-09-28 ENCOUNTER — Ambulatory Visit
Admission: RE | Admit: 2012-09-28 | Discharge: 2012-09-28 | Disposition: A | Discharge status: Home / Self Care | Payer: Medicare Other | Source: Ambulatory Visit | Attending: Radiation Oncology | Admitting: Radiation Oncology

## 2012-09-28 ENCOUNTER — Ambulatory Visit: Payer: Medicare Other

## 2012-09-28 ENCOUNTER — Encounter (INDEPENDENT_AMBULATORY_CARE_PROVIDER_SITE_OTHER): Payer: Self-pay | Admitting: General Surgery

## 2012-09-28 ENCOUNTER — Ambulatory Visit: Payer: Medicare Other | Attending: General Surgery | Admitting: Physical Therapy

## 2012-09-28 VITALS — BP 148/94 | HR 62 | Temp 98.4°F | Resp 20

## 2012-09-28 DIAGNOSIS — C50319 Malignant neoplasm of lower-inner quadrant of unspecified female breast: Secondary | ICD-10-CM

## 2012-09-28 DIAGNOSIS — Z171 Estrogen receptor negative status [ER-]: Secondary | ICD-10-CM

## 2012-09-28 DIAGNOSIS — D0590 Unspecified type of carcinoma in situ of unspecified breast: Secondary | ICD-10-CM | POA: Diagnosis not present

## 2012-09-28 DIAGNOSIS — Z853 Personal history of malignant neoplasm of breast: Secondary | ICD-10-CM | POA: Diagnosis not present

## 2012-09-28 DIAGNOSIS — M545 Low back pain, unspecified: Secondary | ICD-10-CM | POA: Diagnosis not present

## 2012-09-28 DIAGNOSIS — R293 Abnormal posture: Secondary | ICD-10-CM | POA: Insufficient documentation

## 2012-09-28 DIAGNOSIS — C50311 Malignant neoplasm of lower-inner quadrant of right female breast: Secondary | ICD-10-CM

## 2012-09-28 DIAGNOSIS — C50919 Malignant neoplasm of unspecified site of unspecified female breast: Secondary | ICD-10-CM | POA: Insufficient documentation

## 2012-09-28 DIAGNOSIS — IMO0001 Reserved for inherently not codable concepts without codable children: Secondary | ICD-10-CM | POA: Diagnosis not present

## 2012-09-28 LAB — COMPREHENSIVE METABOLIC PANEL (CC13)
ALT: 41 U/L (ref 0–55)
Albumin: 3.5 g/dL (ref 3.5–5.0)
CO2: 30 mEq/L — ABNORMAL HIGH (ref 22–29)
Calcium: 9.4 mg/dL (ref 8.4–10.4)
Chloride: 103 mEq/L (ref 98–107)
Glucose: 103 mg/dl — ABNORMAL HIGH (ref 70–99)
Potassium: 4.2 mEq/L (ref 3.5–5.1)
Sodium: 142 mEq/L (ref 136–145)
Total Protein: 7.2 g/dL (ref 6.4–8.3)

## 2012-09-28 LAB — CBC WITH DIFFERENTIAL/PLATELET
BASO%: 0.9 % (ref 0.0–2.0)
Eosinophils Absolute: 0.2 10*3/uL (ref 0.0–0.5)
MCHC: 32.7 g/dL (ref 31.5–36.0)
MONO#: 0.5 10*3/uL (ref 0.1–0.9)
NEUT#: 2 10*3/uL (ref 1.5–6.5)
Platelets: 388 10*3/uL (ref 145–400)
RBC: 4.42 10*6/uL (ref 3.70–5.45)
WBC: 4.6 10*3/uL (ref 3.9–10.3)
lymph#: 1.8 10*3/uL (ref 0.9–3.3)

## 2012-09-28 NOTE — Progress Notes (Signed)
[facility] NEW PATIENT EVALUATION  Name: Monique Henderson MRN: [id number]  Date:   09/28/2012           DOB: 07-Dec-1950  Status: outpatient   CC: Axel Filler, MD  [name], MD  Dr. 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 female who is seen today at the [facility]. for the courtesy Dr. Marlou Starks for consideration of radiation therapy following conservative surgery in the management of her DCIS of the right breast. She gives a history of having radiation therapy following conservative surgery in the management of DCIS of her left breast in 2005 under the direction of Dr. Tammi Klippel. She did well with that. More recently, she was noted to have new pleomorphic calcifications within the right lower inner quadrant of the right breast at approximately 3 to 4:00. This cover the area of 7 x 9 x 12 mm, 10 cm from the nipple. Stereotactic biopsy on 09/21/2012 was diagnostic for DCIS with calcifications. The DCIS was felt to be high grade. The DCIS was hormone receptor negative. Breast MR was not performed because of a decreased GFR of 24. She is without complaints today.  PREVIOUS RADIATION THERAPY: Status post radiation therapy to her left breast, 5000 cGy to tangential fields followed by a boost for a cumulative dose of 6200 cGy to her left breast tumor bed along the inferior aspect of the left breast. Details are pending at the time this dictation.   PAST MEDICAL HISTORY:  has a past medical history of GERD (gastroesophageal reflux disease); Hepatitis C; Hypertension; Low back pain; Osteoarthritis; Uterine fibroid; Normocytic anemia; Right ureteral stone (2002); Atelectasis (2002); Bone spur (2008); Lumbar spinal stenosis; Breast cancer (2004); and Hot flashes.     PAST SURGICAL HISTORY:  Past Surgical History  Procedure  Laterality Date  . Laminectomy  05/27/2009    Lumbar decompressive laminectomy, fusion and plating for lumbar spinal stensosis  . Mastectomy, partial  02/26/2003    S/P left partial mastectomy 02/26/2003; S/P re-excision of cranial and lateral margins11/18/2004.   . Back surgery    . Breast lumpectomy       FAMILY HISTORY: family history includes Cancer in her mother; Colon cancer (age of onset: 69) in her mother; Diabetes in her brothers and son; Diabetes (age of onset: 16) in her sister; Hypertension in her brothers, mother, sister, and son; Kidney disease in her son; and Multiple sclerosis in her son.  There is no history of Breast cancer and Cervical cancer. Her mother died from colon cancer 5. Her sister was diagnosed with some type of malignancy at age 43, up from what she describes it may be a soft tissue sarcoma involving her right upper M.D. Her father died  in his 45s, unknown cause.    SOCIAL HISTORY:  reports that she has been smoking Cigarettes.  She has a 6.4 pack-year smoking history. She has never used smokeless tobacco. She reports that  drinks alcohol. She reports that she does not use illicit drugs. Single, 2 children. She is on disability for back pain, having had back surgery before. Previously, she worked as a Training and development officer.   ALLERGIES: Shrimp and Lisinopril   MEDICATIONS:  Current Outpatient Prescriptions  Medication Sig Dispense Refill  . albuterol (PROAIR HFA) 108 (90 BASE) MCG/ACT inhaler  Inhale 2 puffs into the lungs every 6 (six) hours as needed for wheezing.  6.7 g  1  . amLODipine (NORVASC) 10 MG tablet Take 1 tablet (10 mg total) by mouth daily.  90 tablet  2  . aspirin 325 MG tablet Take 1 tablet (325 mg total) by mouth daily.  100 tablet  3  . B Complex Vitamins (VITAMIN B COMPLEX PO) Take 1 tablet by mouth daily.        . cetirizine (ZYRTEC) 10 MG tablet Take 1 tablet (10 mg total) by mouth daily.  30 tablet  11  . cloNIDine (CATAPRES) 0.1 MG tablet Take 1 tablet  (0.1 mg total) by mouth 2 (two) times daily.  60 tablet  11  . famotidine (PEPCID) 20 MG tablet Take 1 tablet (20 mg total) by mouth at bedtime.  30 tablet  11  . ibuprofen (ADVIL,MOTRIN) 200 MG tablet Take 600 mg by mouth every 6 (six) hours as needed for pain.      Marland Kitchen lidocaine (LIDODERM) 5 % Place 1 patch onto the skin daily. Remove & Discard patch within 12 hours or as directed by MD  30 patch  0  . losartan (COZAAR) 25 MG tablet Take 1 tablet (25 mg total) by mouth daily.  30 tablet  1  . Multiple Vitamin (MULTIVITAMIN WITH MINERALS) TABS Take 1 tablet by mouth daily.      Marland Kitchen omeprazole (PRILOSEC) 40 MG capsule Take 1 capsule (40 mg total) by mouth daily.  30 capsule  11  . oxyCODONE-acetaminophen (PERCOCET/ROXICET) 5-325 MG per tablet Take 10-325 tablets by mouth.      . traMADol (ULTRAM) 50 MG tablet Take 1 tablet (50 mg total) by mouth every 6 (six) hours as needed for pain.  120 tablet  0  . zolpidem (AMBIEN) 5 MG tablet Take 1 tablet (5 mg total) by mouth at bedtime as needed for sleep.  30 tablet  2  . [DISCONTINUED] lisinopril (PRINIVIL,ZESTRIL) 10 MG tablet Take 1 tablet (10 mg total) by mouth daily.  30 tablet  6   No current facility-administered medications for this encounter.     REVIEW OF SYSTEMS:  Pertinent items are noted in HPI.    PHYSICAL EXAM:  Wt Readings from Last 3 Encounters:  09/08/12 132 lb 6.4 oz (60.056 kg)  04/20/12 129 lb 6.4 oz (58.695 kg)  04/08/11 128 lb 14.4 oz (58.469 kg)   Temp Readings from Last 3 Encounters:  09/28/12 98.4 F (36.9 C) Oral  09/08/12 97.9 F (36.6 C) Oral  07/12/12 98.1 F (36.7 C) Oral   BP Readings from Last 3 Encounters:  09/28/12 148/94  09/08/12 158/100  07/12/12 149/98   Pulse Readings from Last 3 Encounters:  09/28/12 62  09/08/12 81  07/12/12 60     Alert and oriented 62 year old African- American female appearing her stated age. Head and neck examination: Grossly unremarkable. Nodes: Without palpable  cervical, supraclavicular, or axillary lymphadenopathy. Chest: Lungs clear. Heart: Regular in rhythm. Breasts: There is area of ecchymosis along the central right breast with a punctate biopsy wound at approximately 3:00. No masses are appreciated. Lung left breast are partial mastectomy scars at 1:00 and also 7:00. There is slight thickening of the breast. No masses are appreciated. Abdomen without hepatomegaly. Extremities: Without edema. Neurologic examination: Grossly nonfocal.   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 I explained to the patient that her local treatment options include partial mastectomy followed by radiation therapy or simple mastectomy. She desires breast preservation. I reviewed her previous treatment fields and it should be no difficulty irradiating her right breast. We discussed the potential acute and late toxicities of radiation therapy. She may be a candidate for hypo-fractionated radiation therapy.   PLAN: As discussed above.   I spent 40 minutes minutes face to face with the patient and more than 50% of that time was spent in counseling and/or coordination of care including review previous radiation therapy.

## 2012-09-28 NOTE — Patient Instructions (Addendum)
Proceed with surgery  i will see you back in 1 month

## 2012-09-28 NOTE — Progress Notes (Signed)
Monique Henderson [id number] May 02, 1951 62 y.o. 09/28/2012 11:29 AM  CC  Monique Filler, MD [address] [place] [address] Dr. Autumn Messing Dr. 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 Patient is seen in the multidisciplinary breast clinic for discussion of treatment options.  STAGE:   Cancer of lower-inner quadrant of female breast   Primary site: Breast (Right)   Staging method: AJCC 7th Edition   Clinical: Stage 0 (Tis (DCIS), N0, cM0)   Summary: Stage 0 (Tis (DCIS), N0, cM0)  REFERRING PHYSICIAN: Dr. Autumn Messing  HISTORY OF PRESENT ILLNESS:  Monique Henderson is a 62 y.o. female.  With prior history of breast cancer of the left breast diagnosed in 2004 at that time she underwent a lumpectomy followed by radiation therapy. According to the patient she did not receive any kind of chemotherapy or pills. Most recently patient underwent bilateral diagnostic mammograms at the breast Center. She was noted to have suspicious calcifications in the right lower inner quadrant at 3 to 4:00 position covering an area 7 x 9 x 12 mm but 10 cm from the nipple. She was recommended a stereotactic needle core biopsy. This was performed on 09/21/2012. The biopsy showed ductal carcinoma in situ with calcifications. It was felt to be high grade. There was noted to be a single focus suspicious for stromal invasion. Tumor was estrogen receptor negative progesterone receptor negative. Patient was scheduled to have MRI of the bilateral breasts on 09/27/2012 however because of elevated creatinine and decreased GFR MRI was not performed. Her case was discussed at the multidisciplinary breast conference. Her radiology and pathology were reviewed. She is now seen in the multidisciplinary breast clinic for discussion of treatment options. She is seen also by Dr. Autumn Messing and Dr. Arloa Koh. Patient is without any  complaints.  Also of note patient does have history of hepatitis C and chronic back pain. For back pain she does take Percocet which are prescribed by her primary care physician.   Past Medical History: Past Medical History  Diagnosis Date  . GERD (gastroesophageal reflux disease)   . Hepatitis C   . Hypertension   . Low back pain   . Osteoarthritis   . Uterine fibroid   . Normocytic anemia     With thrombocytosis  . Right ureteral stone 2002  . Atelectasis 2002    Bilateral  . Bone spur 2008    Right calcaneal foot spur  . Lumbar spinal stenosis     S/P lumbar decompressive laminectomy, fusion and plating for lumbar spinal stensosis  . Breast cancer 2004    Ductal carcinoma in situ of the left breast; S/P left partial mastectomy 02/26/2003; S/P re-excision of cranial and lateral margins11/18/2004.  Marland Kitchen Hot flashes     Past Surgical History: Past Surgical History  Procedure Laterality Date  . Laminectomy  05/27/2009    Lumbar decompressive laminectomy, fusion and plating for lumbar spinal stensosis  . Mastectomy, partial  02/26/2003    S/P left partial mastectomy 02/26/2003; S/P re-excision of cranial and lateral margins11/18/2004.   . Back surgery    . Breast lumpectomy      Family History: Family History  Problem Relation Age of Onset  . Colon cancer Mother 83  . Cancer Mother   . Hypertension Mother   . Diabetes Sister 61  . Hypertension Sister   . Breast cancer Neg Hx   .  Cervical cancer Neg Hx   . Diabetes Brother   . Hypertension Brother   . Diabetes Brother   . Hypertension Brother   . Kidney disease Son     On dialysis  . Hypertension Son   . Diabetes Son   . Multiple sclerosis Son     Social History History  Substance Use Topics  . Smoking status: Current Some Day Smoker -- 0.20 packs/day for 32 years    Types: Cigarettes  . Smokeless tobacco: Never Used  . Alcohol Use: 0.0 oz/week    1-2 Cans of beer per week     Comment: Occasional      Allergies: Allergies  Allergen Reactions  . Shrimp (Shellfish Allergy) Shortness Of Breath  . Lisinopril Cough    Current Medications: Current Outpatient Prescriptions  Medication Sig Dispense Refill  . albuterol (PROAIR HFA) 108 (90 BASE) MCG/ACT inhaler Inhale 2 puffs into the lungs every 6 (six) hours as needed for wheezing.  6.7 g  1  . amLODipine (NORVASC) 10 MG tablet Take 1 tablet (10 mg total) by mouth daily.  90 tablet  2  . aspirin 325 MG tablet Take 1 tablet (325 mg total) by mouth daily.  100 tablet  3  . B Complex Vitamins (VITAMIN B COMPLEX PO) Take 1 tablet by mouth daily.        . cetirizine (ZYRTEC) 10 MG tablet Take 1 tablet (10 mg total) by mouth daily.  30 tablet  11  . famotidine (PEPCID) 20 MG tablet Take 1 tablet (20 mg total) by mouth at bedtime.  30 tablet  11  . losartan (COZAAR) 25 MG tablet Take 1 tablet (25 mg total) by mouth daily.  30 tablet  1  . omeprazole (PRILOSEC) 40 MG capsule Take 1 capsule (40 mg total) by mouth daily.  30 capsule  11  . oxyCODONE-acetaminophen (PERCOCET/ROXICET) 5-325 MG per tablet Take 10-325 tablets by mouth.      . zolpidem (AMBIEN) 5 MG tablet Take 1 tablet (5 mg total) by mouth at bedtime as needed for sleep.  30 tablet  2  . cloNIDine (CATAPRES) 0.1 MG tablet Take 1 tablet (0.1 mg total) by mouth 2 (two) times daily.  60 tablet  11  . ibuprofen (ADVIL,MOTRIN) 200 MG tablet Take 600 mg by mouth every 6 (six) hours as needed for pain.      Marland Kitchen lidocaine (LIDODERM) 5 % Place 1 patch onto the skin daily. Remove & Discard patch within 12 hours or as directed by MD  30 patch  0  . Multiple Vitamin (MULTIVITAMIN WITH MINERALS) TABS Take 1 tablet by mouth daily.      . traMADol (ULTRAM) 50 MG tablet Take 1 tablet (50 mg total) by mouth every 6 (six) hours as needed for pain.  120 tablet  0  . [DISCONTINUED] lisinopril (PRINIVIL,ZESTRIL) 10 MG tablet Take 1 tablet (10 mg total) by mouth daily.  30 tablet  6   No current  facility-administered medications for this visit.    OB/GYN History: Menarche at age 46 she underwent menopause 07/15/2011 she has never been on hormone replacement therapy first live birth was at 42.  Fertility Discussion: Not applicable Prior History of Cancer: Patient does have prior history of left-sided breast cancer she underwent a lumpectomy in 2004 followed by radiation and then observation.  Health Maintenance:  Colonoscopy no Bone Density no Last PAP smear 2013  ECOG PERFORMANCE STATUS: 1 - Symptomatic but completely ambulatory  Genetic  Counseling/testing: Due to bilateral breast cancers she will be referred to genetic counseling  REVIEW OF SYSTEMS: Comprehensive review of systems is scant separately in the medical record  PHYSICAL EXAMINATION: Blood pressure 148/94, pulse 62, temperature 98.4 F (36.9 C), temperature source Oral, resp. rate 20.  Well-developed well-nourished female in no acute distress HEENT exam: EOMI PERRLA sclerae anicteric no conjunctival pallor oral mucosa is moist neck is supple Lungs: Clear to auscultation Cardiovascular: Regular rate rhythm Abdomen: Soft nontender nondistended no HSM, bowel sounds are present Extremities: No edema Neuro: Nonfocal Breast exam: Right breast reveals a palpable hematoma otherwise no masses nipple discharge or other skin changes.; Left breast reveals well-healed surgical scar from prior lumpectomy no evidence of local recurrence no nipple discharge or skin changes    STUDIES/RESULTS: Mr Breast Bilateral W Wo Contrast  09/27/2012  BUN and creatinine were obtained on site at [facility] at [address]  Results:  BUN 16 mg/dL, Creatinine 2.5 mg/dL, estimated GFR 24.  Because of the elevated creatinine and the decreased estimated GFR, the MRI was not performed.   Original Report Authenticated By: Evangeline Dakin, M.D.    Mm Digital Diagnostic Bilat  09/14/2012  *RADIOLOGY REPORT*  Clinical Data:  The  patient underwent left lumpectomy for breast cancer in 2004.  She complains of pruritus in the area of a skin lesion in the inferior portion of the right breast.  This has been present for years.  DIGITAL DIAGNOSTIC BILATERAL MAMMOGRAM WITH CAD  Comparison: 11/29/2006, 02/22/2008, 02/22/2009, 05/22/2010, 07/16/2011  Findings:  ACR Breast Density Category 2: There are scattered fibroglandular densities.  Left lumpectomy changes are present in the lower inner quadrant. Benign excisional biopsy changes are present in the left upper outer quadrant.  There is no suspicious dominant mass or nonsurgical architectural distortion.  There are pleomorphic calcifications in the right lower inner quadrant at approximately 3-4 o'clock.  These cover an area of 7 x 9 x 12 mm and are approximately 10 cm from the nipple.  These are viewed with suspicion.  Stereotactic core needle biopsy is suggested.  Mammographic images were processed with CAD.  IMPRESSION: Suspicious microcalcifications at three to four o'clock in the right breast.  RECOMMENDATION: Stereotactic core needle biopsy is suggested.  This has been scheduled for 08/21/2012 at 9:00 a.m.  I have discussed the findings and recommendations with the patient. Results were also provided in writing at the conclusion of the visit.  If applicable, a reminder letter will be sent to the patient regarding her next appointment.  BI-RADS CATEGORY 4:  Suspicious abnormality - biopsy should be considered.   Original Report Authenticated By: Ulyess Blossom, M.D.    Mm Radiologist Eval And Mgmt  09/22/2012  *RADIOLOGY REPORT*  ESTABLISHED PATIENT OFFICE VISIT - LEVEL II [phone])  Chief Complaint:  The patient returns for biopsy results after stereotactic guided core biopsy of right breast calcifications. History of left lumpectomy with radiation therapy in 2004.  History:  A cluster of calcifications within the lower inner quadrant of the right breast.  Exam:  Biopsy site is clean and  dry.  No visible ecchymosis or palpable hematoma.  Pathology: In situ ductal carcinoma.  A single focus is suspicious for a invasion.  Carcinoma appears high grade.  The findings correlate well with the imaging appearance.  Assessment and Plan:  I met with the patient and we discussed the findings and recommendations of surgical consultation.  The patient has been seen in the past for left  breast cancer.  Surgical consultation or multidisciplinary clinic will be arranged for the patient.  MRI has been scheduled for 09/27/2012 at 8:30 a.m.The patient is given Scientist, clinical (histocompatibility and immunogenetics) and questions were answered.   Original Report Authenticated By: Nolon Nations, M.D.    Mm Rt Breast Bx W Loc Dev 1st Lesion Image Bx Spec Stereo Guide  09/21/2012  *RADIOLOGY REPORT*  Clinical Data:  Suspicious microcalcifications right breast for biopsy  STEREOTACTIC-GUIDED VACUUM ASSISTED BIOPSY OF THE RIGHT BREAST AND SPECIMEN RADIOGRAPH  Comparison: Previous exams.  I met with the patient and we discussed the procedure of stereotactic-guided biopsy, including benefits and alternatives. We discussed the high likelihood of a successful procedure. We discussed the risks of the procedure, including infection, bleeding, tissue injury, clip migration, and inadequate sampling. Informed, written consent was given.  Using sterile technique, 2% lidocaine, stereotactic guidance, and a 9 gauge vacuum assisted device, biopsy was performed of a cluster microcalcifications in the medial right breast using a medial approach.  Specimen radiograph was performed, showing inclusion of calcifications of concern.  Specimens with calcifications are identified for pathology.  At the conclusion of the procedure, a T-shaped tissue marker clip was deployed into the biopsy cavity.  Follow-up 2-view mammogram confirmed clip in the area of concern.  IMPRESSION: Stereotactic-guided biopsy of right breast.  No apparent complications.   Original Report  Authenticated By: Abelardo Diesel, M.D.      LABS:    Chemistry      Component Value Date/Time   NA 142 09/28/2012 0831   NA 139 04/20/2012 1220   K 4.2 09/28/2012 0831   K 3.5 04/20/2012 1220   CL 103 09/28/2012 0831   CL 101 04/20/2012 1220   CO2 30* 09/28/2012 0831   CO2 29 04/20/2012 1220   BUN 18.3 09/28/2012 0831   BUN 22 04/20/2012 1220   CREATININE 1.2* 09/28/2012 0831   CREATININE 1.66* 04/20/2012 1220   CREATININE 1.36* 12/18/2009 1918      Component Value Date/Time   CALCIUM 9.4 09/28/2012 0831   CALCIUM 9.9 04/20/2012 1220   ALKPHOS 89 09/28/2012 0831   ALKPHOS 74 04/20/2012 1220   AST 35* 09/28/2012 0831   AST 31 04/20/2012 1220   ALT 41 09/28/2012 0831   ALT 33 04/20/2012 1220   BILITOT 0.34 09/28/2012 0831   BILITOT 0.4 04/20/2012 1220      Lab Results  Component Value Date   WBC 4.6 09/28/2012   HGB 13.1 09/28/2012   HCT 40.1 09/28/2012   MCV 90.7 09/28/2012   PLT 388 09/28/2012       PATHOLOGY: Diagnosis Breast, right, needle core biopsy - DUCTAL CARCINOMA WITH CALCIFICATIONS. - SEE COMMENT. Microscopic Comment The vast majority of the carcinoma is in situ and is high grade. There is a single focus suspicious for stromal invasion. Estrogen receptor and progesterone receptor studies will be performed and the results reported separately.   ASSESSMENT    62 year old female with  #1 prior history of breast cancer on the left side diagnosed in 2004 status post lumpectomy followed by radiation and then observation.  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 This tumor is ER negative PR negative with a possibly suspicious areas stromal invasion. Patient and I discussed her pathology and radiology in detail today. She wants to have breast conservation and I do think that she would be a good breast conservation candidate. She was seen by Dr.  Autumn Messing who has recommended  lumpectomy with sentinel lymph node biopsy. She will proceed with this first.  #3 we discussed genetic today since she does have now bilateral breast cancer and she has a estrogen receptor negative disease. She is willing to be seen by our genetic counselor.  #4 she will also need postlumpectomy radiation.  #5 systemically patient most likely will not require any kind of adjuvant therapy however I would like to see the final pathology before making this recommendation.  Clinical Trial Eligibility:0 Multidisciplinary conference discussion yes     PLAN:    #1 proceed with lumpectomy and sentinel lymph node biopsy.  #2 refer to genetic counseling.  #3 patient will be seen back after her surgery.       Discussion: Patient is being treated per NCCN breast cancer care guidelines appropriate for stage.0   Thank you so much for allowing me to participate in the care of Monique Henderson. I will continue to follow up the patient with you and assist in her care.  All questions were answered. The patient knows to call the clinic with any problems, questions or concerns. We can certainly see the patient much sooner if necessary.  I spent 55 minutes counseling the patient face to face. The total time spent in the appointment was 60 minutes.   Marcy Panning, MD Medical/Oncology [facility] [phone] (beeper) [phone] (Office)  09/28/2012, 11:29 AM

## 2012-09-28 NOTE — Progress Notes (Signed)
Subjective:     Patient ID: Monique Henderson, female   DOB: January 06, 1951, 62 y.o.   MRN: [id number]  HPI We are asked to see the pt in consultation by Dr. Owens Shark to evaluate her for right breast cancer. She recently went for a routine mammogram at which time some abnormal calcifications were found. 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 She noted some stinging sensation in her breast prior to this but no discharge from the nipple. She has been treated for left sided breast cancer in the past.  Review of Systems  Constitutional: Negative.   HENT: Negative.   Eyes: Negative.   Respiratory: Negative.   Cardiovascular: Negative.   Gastrointestinal: Negative.   Endocrine: Negative.   Genitourinary: Negative.   Musculoskeletal: Negative.   Skin: Negative.   Allergic/Immunologic: Negative.   Neurological: Negative.   Hematological: Negative.   Psychiatric/Behavioral: Negative.        Objective:   Physical Exam  Constitutional: She is oriented to person, place, and time. She appears well-developed and well-nourished.  HENT:  Head: Normocephalic and atraumatic.  Eyes: Conjunctivae and EOM are normal. Pupils are equal, round, and reactive to light.  Neck: Normal range of motion. Neck supple.  Cardiovascular: Normal rate, regular rhythm and normal heart sounds.   Pulmonary/Chest: Effort normal and breath sounds normal.  There is no palpable mass in either breast except for some bruising in the medial right breast. There is no palpable axillary, supraclavicular, or cervical lymphadenopathy  Abdominal: Soft. Bowel sounds are normal. She exhibits no mass. There is no tenderness.  Musculoskeletal: Normal range of motion.  Lymphadenopathy:    She has no cervical adenopathy.  Neurological: She is alert and oriented to person, place, and time.  Skin: Skin is warm and dry.  Psychiatric: She has a normal mood and affect. Her behavior is normal.        Assessment:     The pt has a small area of dcis with possible invasion in the inner right breast. I have discussed with her in detail the different treatment options and she favors breast conservation. I think this is a reasonable choice. I have discussed with her the risks and benefits of surgery as well as some of the technical aspects and she understands and wishes to proceed     Plan:     Plan for right breast wire localized lumpectomy and sentinel node mapping

## 2012-09-28 NOTE — Progress Notes (Signed)
Checked in new patient. She does have her [facility] packet. No financial issues.

## 2012-09-29 ENCOUNTER — Encounter (HOSPITAL_COMMUNITY): Payer: Self-pay | Admitting: Emergency Medicine

## 2012-09-29 ENCOUNTER — Emergency Department (HOSPITAL_COMMUNITY)
Admission: EM | Admit: 2012-09-29 | Discharge: 2012-09-29 | Disposition: A | Discharge status: Home / Self Care | Payer: Medicare Other | Attending: Emergency Medicine | Admitting: Emergency Medicine

## 2012-09-29 ENCOUNTER — Telehealth: Payer: Self-pay | Admitting: *Deleted

## 2012-09-29 DIAGNOSIS — M545 Low back pain, unspecified: Secondary | ICD-10-CM | POA: Insufficient documentation

## 2012-09-29 DIAGNOSIS — Z79899 Other long term (current) drug therapy: Secondary | ICD-10-CM | POA: Insufficient documentation

## 2012-09-29 DIAGNOSIS — IMO0001 Reserved for inherently not codable concepts without codable children: Secondary | ICD-10-CM | POA: Diagnosis not present

## 2012-09-29 DIAGNOSIS — Z8739 Personal history of other diseases of the musculoskeletal system and connective tissue: Secondary | ICD-10-CM | POA: Diagnosis not present

## 2012-09-29 DIAGNOSIS — G8929 Other chronic pain: Secondary | ICD-10-CM | POA: Insufficient documentation

## 2012-09-29 DIAGNOSIS — Z8742 Personal history of other diseases of the female genital tract: Secondary | ICD-10-CM | POA: Diagnosis not present

## 2012-09-29 DIAGNOSIS — I10 Essential (primary) hypertension: Secondary | ICD-10-CM | POA: Insufficient documentation

## 2012-09-29 DIAGNOSIS — Z853 Personal history of malignant neoplasm of breast: Secondary | ICD-10-CM | POA: Diagnosis not present

## 2012-09-29 DIAGNOSIS — Z7982 Long term (current) use of aspirin: Secondary | ICD-10-CM | POA: Diagnosis not present

## 2012-09-29 DIAGNOSIS — F17200 Nicotine dependence, unspecified, uncomplicated: Secondary | ICD-10-CM | POA: Diagnosis not present

## 2012-09-29 DIAGNOSIS — Z8619 Personal history of other infectious and parasitic diseases: Secondary | ICD-10-CM | POA: Diagnosis not present

## 2012-09-29 DIAGNOSIS — Z8709 Personal history of other diseases of the respiratory system: Secondary | ICD-10-CM | POA: Insufficient documentation

## 2012-09-29 DIAGNOSIS — Z862 Personal history of diseases of the blood and blood-forming organs and certain disorders involving the immune mechanism: Secondary | ICD-10-CM | POA: Insufficient documentation

## 2012-09-29 DIAGNOSIS — K219 Gastro-esophageal reflux disease without esophagitis: Secondary | ICD-10-CM | POA: Diagnosis not present

## 2012-09-29 MED ORDER — IBUPROFEN 800 MG PO TABS: 800.0000 mg | ORAL_TABLET | Freq: Three times a day (TID) | ORAL | Status: DC

## 2012-09-29 MED ORDER — OXYCODONE-ACETAMINOPHEN 5-325 MG PO TABS: ORAL_TABLET | ORAL | Status: DC

## 2012-09-29 NOTE — ED Notes (Signed)
Pt here for c/o lower back pain not relieved with over counter med with heating pad Hx of back Sx a year ago

## 2012-09-29 NOTE — ED Provider Notes (Signed)
History     CSN: [id number]  Arrival date & time 09/29/12  [id number]   First MD Initiated Contact with Patient 09/29/12 [phone]      Chief Complaint  Patient presents with  . Back Pain    (Consider location/radiation/quality/duration/timing/severity/associated sxs/prior treatment) HPI Pt has a hx of LBP after back surgery 1-55yrs ago. Pain is dull and burning at times, moderate to severe, worse with certain movements and lying flat. Radiates down right leg on occasion.  States she use to be in a pain clinic but they discharged her in Jan/Feb 2014.  Is trying to be referred to another pain clinic but does not have an appointment with PCP Dr. Ronnald Ramp until 5/14.  OTC pain meds do not help with pain.  Denies fever, n/v/d, no urinary symptoms.    Past Medical History  Diagnosis Date  . GERD (gastroesophageal reflux disease)   . Hepatitis C   . Hypertension   . Low back pain   . Osteoarthritis   . Uterine fibroid   . Normocytic anemia     With thrombocytosis  . Right ureteral stone 2002  . Atelectasis 2002    Bilateral  . Bone spur 2008    Right calcaneal foot spur  . Lumbar spinal stenosis     S/P lumbar decompressive laminectomy, fusion and plating for lumbar spinal stensosis  . Breast cancer 2004    Ductal carcinoma in situ of the left breast; S/P left partial mastectomy 02/26/2003; S/P re-excision of cranial and lateral margins11/18/2004.  Marland Kitchen Hot flashes     Past Surgical History  Procedure Laterality Date  . Laminectomy  05/27/2009    Lumbar decompressive laminectomy, fusion and plating for lumbar spinal stensosis  . Mastectomy, partial  02/26/2003    S/P left partial mastectomy 02/26/2003; S/P re-excision of cranial and lateral margins11/18/2004.   . Back surgery    . Breast lumpectomy      Family History  Problem Relation Age of Onset  . Colon cancer Mother 70  . Cancer Mother   . Hypertension Mother   . Diabetes Sister 15  . Hypertension Sister   . Breast cancer Neg Hx    . Cervical cancer Neg Hx   . Diabetes Brother   . Hypertension Brother   . Diabetes Brother   . Hypertension Brother   . Kidney disease Son     On dialysis  . Hypertension Son   . Diabetes Son   . Multiple sclerosis Son     History  Substance Use Topics  . Smoking status: Current Some Day Smoker -- 0.20 packs/day for 32 years    Types: Cigarettes  . Smokeless tobacco: Never Used  . Alcohol Use: 0.0 oz/week    1-2 Cans of beer per week     Comment: Occasional     OB History   Grav Para Term Preterm Abortions TAB SAB Ect Mult Living   2 2 1 1             Review of Systems  Constitutional: Negative for fever and chills.  Musculoskeletal: Positive for myalgias and back pain. Negative for gait problem.  All other systems reviewed and are negative.    Allergies  Shrimp and Lisinopril  Home Medications   Current Outpatient Rx  Name  Route  Sig  Dispense  Refill  . albuterol (PROAIR HFA) 108 (90 BASE) MCG/ACT inhaler   Inhalation   Inhale 2 puffs into the lungs every 6 (six) hours as needed for  wheezing.   6.7 g   1   . amLODipine (NORVASC) 10 MG tablet   Oral   Take 1 tablet (10 mg total) by mouth daily.   90 tablet   2   . aspirin 325 MG tablet   Oral   Take 1 tablet (325 mg total) by mouth daily.   100 tablet   3   . B Complex Vitamins (VITAMIN B COMPLEX PO)   Oral   Take 1 tablet by mouth daily.           . cetirizine (ZYRTEC) 10 MG tablet   Oral   Take 1 tablet (10 mg total) by mouth daily.   30 tablet   11   . cloNIDine (CATAPRES) 0.1 MG tablet   Oral   Take 1 tablet (0.1 mg total) by mouth 2 (two) times daily.   60 tablet   11   . famotidine (PEPCID) 20 MG tablet   Oral   Take 1 tablet (20 mg total) by mouth at bedtime.   30 tablet   11   . lidocaine (LIDODERM) 5 %   Transdermal   Place 1 patch onto the skin daily. Remove & Discard patch within 12 hours or as directed by MD   30 patch   0   . losartan (COZAAR) 25 MG tablet    Oral   Take 1 tablet (25 mg total) by mouth daily.   30 tablet   1   . Multiple Vitamin (MULTIVITAMIN WITH MINERALS) TABS   Oral   Take 1 tablet by mouth daily.         Marland Kitchen omeprazole (PRILOSEC) 40 MG capsule   Oral   Take 1 capsule (40 mg total) by mouth daily.   30 capsule   11   . traMADol (ULTRAM) 50 MG tablet   Oral   Take 1 tablet (50 mg total) by mouth every 6 (six) hours as needed for pain.   120 tablet   0   . zolpidem (AMBIEN) 5 MG tablet   Oral   Take 1 tablet (5 mg total) by mouth at bedtime as needed for sleep.   30 tablet   2   . ibuprofen (ADVIL,MOTRIN) 800 MG tablet   Oral   Take 1 tablet (800 mg total) by mouth 3 (three) times daily.   21 tablet   0   . oxyCODONE-acetaminophen (PERCOCET/ROXICET) 5-325 MG per tablet      Take 1-2 tabs by mouth every 4-6hrs as needed for pain   10 tablet   0     BP 186/106  Pulse 71  Temp(Src) 98.2 F (36.8 C) (Oral)  Resp 20  Ht 5\' 4"  (1.626 m)  Wt 130 lb (58.968 kg)  BMI 22.3 kg/m2  SpO2 99%  Physical Exam  Nursing note and vitals reviewed. Constitutional: She appears well-developed and well-nourished. No distress.  HENT:  Head: Normocephalic and atraumatic.  Eyes: Conjunctivae are normal. No scleral icterus.  Neck: Normal range of motion. Neck supple. No JVD present. No tracheal deviation present. No thyromegaly present.  No neck pain or nuchal rigidity  Cardiovascular: Normal rate, regular rhythm and normal heart sounds.   Pulmonary/Chest: Effort normal and breath sounds normal. No stridor. No respiratory distress. She has no wheezes. She has no rales. She exhibits no tenderness.  Abdominal: Soft. Bowel sounds are normal. She exhibits no distension. There is no tenderness.  Musculoskeletal: Normal range of motion. She exhibits tenderness ( diffuse lumbar  tenderness, R>L). She exhibits no edema.  Lymphadenopathy:    She has no cervical adenopathy.  Neurological: She is alert. She has normal reflexes.   Pt able to ambulate well.  Nl strength and sensation in UE and LE.  Skin: Skin is warm and dry. She is not diaphoretic.  Psychiatric: She has a normal mood and affect. Her behavior is normal.    ED Course  Procedures (including critical care time)  Labs Reviewed - No data to display No results found.   1. Chronic low back pain   2. HTN (hypertension)       MDM  Chronic LBP.  Waiting for referral to pain management by PCP.  Appointment is 5/14.  Denies fever, n/v/d, urinary symptoms.  Not concerned for cauda equina or other emergent process taking place at this time.  No need for further imaging or workup.  Rx: percocet and ibuprofen  Pt is aware of HTN, admits to not taking meds this morning b/c she was preoccupied by back pain.         Noland Fordyce, PA-C 09/29/12 1053

## 2012-09-29 NOTE — Telephone Encounter (Signed)
Sent an email to [facility] for a time slot...td

## 2012-09-30 NOTE — ED Provider Notes (Signed)
Medical screening examination/treatment/procedure(s) were performed by non-physician practitioner and as supervising physician I was immediately available for consultation/collaboration.   Hoy Morn, MD 09/30/12 [phone]

## 2012-10-03 ENCOUNTER — Encounter: Payer: Self-pay | Admitting: *Deleted

## 2012-10-03 NOTE — Progress Notes (Signed)
[place] Psychosocial Distress Screening Clinical Social Work  Patient completed distress screening protocol, and scored a 10 on the Psychosocial Distress Thermometer which indicates severe distress. Clinical Social Worker met with patient on 09/28/12 in [facility] to assess for distress and other psychosocial needs.  Pt stated she "better" after speaking with the physicians and getting more information on her treatment plan.  CSW informed pt of the support team and support services at [facility].  CSW encouraged pt to call with any questions or concerns.      Johnnye Lana, MSW, [place] Worker [facility] [phone]

## 2012-10-05 ENCOUNTER — Other Ambulatory Visit: Payer: Medicare Other | Admitting: Lab

## 2012-10-05 ENCOUNTER — Telehealth: Payer: Self-pay | Admitting: Oncology

## 2012-10-06 ENCOUNTER — Telehealth: Payer: Self-pay | Admitting: *Deleted

## 2012-10-06 NOTE — Telephone Encounter (Signed)
Spoke to pt concerning [place] from 09/28/12.  Pt denies questions or concerns regarding dx or treatment care plan.  Confirmed genetics appt on 10/17/12.  Encourage pt to call with needs.  Received verbal understanding.  Contact information given.

## 2012-10-10 ENCOUNTER — Encounter (HOSPITAL_BASED_OUTPATIENT_CLINIC_OR_DEPARTMENT_OTHER): Payer: Self-pay | Admitting: *Deleted

## 2012-10-10 ENCOUNTER — Encounter: Payer: Self-pay | Admitting: *Deleted

## 2012-10-10 ENCOUNTER — Other Ambulatory Visit: Payer: Self-pay | Admitting: Internal Medicine

## 2012-10-10 NOTE — Progress Notes (Signed)
To come in for ekg-allergic shellfish but betadine ok

## 2012-10-11 ENCOUNTER — Telehealth: Payer: Self-pay | Admitting: *Deleted

## 2012-10-11 NOTE — Telephone Encounter (Signed)
sw pt gave appt for KK on 10/27/12 @ 12:30pm. Pt is aware...td

## 2012-10-12 ENCOUNTER — Encounter: Payer: Self-pay | Admitting: Internal Medicine

## 2012-10-12 ENCOUNTER — Encounter (HOSPITAL_BASED_OUTPATIENT_CLINIC_OR_DEPARTMENT_OTHER)
Admission: RE | Admit: 2012-10-12 | Discharge: 2012-10-12 | Disposition: A | Discharge status: Home / Self Care | Payer: Medicare Other | Source: Ambulatory Visit | Attending: General Surgery | Admitting: General Surgery

## 2012-10-12 ENCOUNTER — Other Ambulatory Visit: Payer: Self-pay

## 2012-10-12 ENCOUNTER — Ambulatory Visit (INDEPENDENT_AMBULATORY_CARE_PROVIDER_SITE_OTHER): Payer: Medicare Other | Admitting: Internal Medicine

## 2012-10-12 VITALS — BP 140/80 | HR 60 | Temp 98.5°F | Ht 64.0 in | Wt 135.1 lb

## 2012-10-12 DIAGNOSIS — M545 Low back pain: Secondary | ICD-10-CM

## 2012-10-12 DIAGNOSIS — C50311 Malignant neoplasm of lower-inner quadrant of right female breast: Secondary | ICD-10-CM

## 2012-10-12 DIAGNOSIS — F17200 Nicotine dependence, unspecified, uncomplicated: Secondary | ICD-10-CM | POA: Diagnosis not present

## 2012-10-12 DIAGNOSIS — K219 Gastro-esophageal reflux disease without esophagitis: Secondary | ICD-10-CM | POA: Diagnosis not present

## 2012-10-12 DIAGNOSIS — N289 Disorder of kidney and ureter, unspecified: Secondary | ICD-10-CM | POA: Diagnosis not present

## 2012-10-12 DIAGNOSIS — B1710 Acute hepatitis C without hepatic coma: Secondary | ICD-10-CM

## 2012-10-12 DIAGNOSIS — C50319 Malignant neoplasm of lower-inner quadrant of unspecified female breast: Secondary | ICD-10-CM | POA: Diagnosis not present

## 2012-10-12 DIAGNOSIS — Z853 Personal history of malignant neoplasm of breast: Secondary | ICD-10-CM | POA: Diagnosis not present

## 2012-10-12 DIAGNOSIS — I10 Essential (primary) hypertension: Secondary | ICD-10-CM

## 2012-10-12 DIAGNOSIS — D0590 Unspecified type of carcinoma in situ of unspecified breast: Secondary | ICD-10-CM | POA: Diagnosis not present

## 2012-10-12 LAB — CBC WITH DIFFERENTIAL/PLATELET
Basophils Relative: 1 % (ref 0–1)
Eosinophils Absolute: 0.2 10*3/uL (ref 0.0–0.7)
Eosinophils Relative: 4 % (ref 0–5)
Lymphs Abs: 2 10*3/uL (ref 0.7–4.0)
MCH: 29 pg (ref 26.0–34.0)
MCHC: 33 g/dL (ref 30.0–36.0)
MCV: 87.9 fL (ref 78.0–100.0)
Platelets: 413 10*3/uL — ABNORMAL HIGH (ref 150–400)
RDW: 14.6 % (ref 11.5–15.5)

## 2012-10-12 NOTE — Assessment & Plan Note (Signed)
  Assessment: Progress toward smoking cessation:  smoking less Barriers to progress toward smoking cessation:  lack of motivation to quit Comments: Patient reports that she is smoking only about one cigarette per day at this time, and she declines assistance with smoking cessation; she reports that she can do this on her own and she agreed to simply stop smoking.  Plan: Instruction/counseling given:  I counseled patient on the dangers of tobacco use, advised patient to stop smoking and reviewed strategies to maximize success. Educational resources provided:  [employer] [employer] account manager) brochure Self management tools provided:  smoking cessation plan (STAR Quit Plan) Medications to assist with smoking cessation:  None Patient agreed to the following self-care plans for smoking cessation: set a quit date and stop smoking

## 2012-10-12 NOTE — Assessment & Plan Note (Signed)
Assessment: I again discussed the advisability with [name] of referral to the hepatitis C clinic, and she agreed to have that appointment scheduled a few months down the road.  Any treatment of her hepatitis C will clearly depend on the status of her breast cancer treatment, which is her primary issue at present.  Plan: The plan is to refer to the hepatitis clinic for management of hepatitis C.

## 2012-10-12 NOTE — Assessment & Plan Note (Signed)
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  The tumor was ER negative PR negative.  Patient is followed by oncologist Dr. Marcy Panning and by surgeon Dr. Autumn Messing, and is scheduled for lumpectomy with sentinel lymph node biopsy 10/13/2012.  Patient has no history of cardiac or pulmonary disease, and no symptoms suggesting cardiac or pulmonary problems.  Plan: Management as per Dr. Humphrey Rolls and Dr. Marlou Starks.

## 2012-10-12 NOTE — Assessment & Plan Note (Signed)
Assessment: Patient has a history of lumbar spinal stenosis and L4-5 degenerative disc disease, and is status post decompressive laminectomy, fusion, and plating on 05/27/2009 by Dr. Eustace Moore.  She reports worsening of her chronic low back pain over the past few months.  She has no signs or symptoms of neurologic compromise.  Plan: I advised patient to follow up with Dr. Ronnald Ramp for evaluation of her worsening back pain.  I will also refer her to a pain specialist.  I discussed with her the option of adding meloxicam to her medication regimen, but we agreed that It would be advisable to wait until after her scheduled breast surgery rather than adding a new medication today.  For now will continue tramadol at the current dose.  Will also check a CBC with differential today.

## 2012-10-12 NOTE — Patient Instructions (Addendum)
General Instructions: A referral to pain clinic has been requested. A referral to the hepatitis C clinic will be requested.    Treatment Goals:  Goals (1 Years of Data) as of 10/12/12         As of Today As of Today 09/29/12 09/28/12 09/08/12     Blood Pressure    . Blood Pressure < 140/90  140/80 160/102 186/106 148/94 158/100      Progress Toward Treatment Goals:  Treatment Goal 10/12/2012  Blood pressure at goal  Stop smoking smoking less    Self Care Goals & Plans:  Self Care Goal 10/12/2012  Manage my medications take my medicines as prescribed; bring my medications to every visit  Monitor my health keep track of my blood pressure; bring my blood pressure log to each visit  Eat healthy foods drink diet soda or water instead of juice or soda; eat more vegetables; eat foods that are low in salt; eat baked foods instead of fried foods; eat smaller portions  Be physically active take a walk every day; find an activity I enjoy  Stop smoking set a quit date and stop smoking       Care Management & Community Referrals:  Referral 10/12/2012  Referrals made to community resources none

## 2012-10-12 NOTE — Assessment & Plan Note (Signed)
BP Readings from Last 3 Encounters:  10/12/12 140/80  09/29/12 186/106  09/28/12 148/94    Lab Results  Component Value Date   NA 142 09/28/2012   K 4.2 09/28/2012   CREATININE 1.2* 09/28/2012    Assessment: Blood pressure control: controlled Progress toward BP goal:  at goal Comments: Blood pressure is at goal on amlodipine 10 mg daily, clonidine 0.1 mg twice a day, and losartan 25 mg daily.  Plan: Medications:  continue current medications Educational resources provided: brochure;video Self management tools provided: home blood pressure logbook Other plans: Check a comprehensive metabolic panel today.

## 2012-10-12 NOTE — Progress Notes (Signed)
  Subjective:    Patient ID: Monique Henderson, female    DOB: April 13, 1951, 62 y.o.   MRN: [id number]  HPI Patient returns for follow-up of her hypertension, low back pain, smoking, and other chronic medical problems.  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  The tumor was ER negative PR negative.  She has been seen by oncologist Dr. Marcy Panning and by surgeon Dr. Autumn Messing, and is scheduled for lumpectomy with sentinel lymph node biopsy tomorrow. Her main complaint today is chronic low back pain and bilateral hip pain which has been worsening over the past 4 months and has prompted at least one visit to the emergency room; at that visit, she was treated with a brief course of oxycodone/acetaminophen.  She was prescribed ibuprofen, but has not been taking that medication; she reports taking tramadol but says it is not relieve her pain; she also reports no improvement with a topical lidocaine patch.  She denies any lower extremity weakness or numbness, and denies any bowel or bladder problems.  Patient denies any history of heart or lung problems, and denies any chest pain or anginal symptoms; she also denies any respiratory symptoms.   Review of Systems  Constitutional: Negative for fever and chills.  Respiratory: Negative for cough, shortness of breath and wheezing.   Cardiovascular: Negative for chest pain, palpitations and leg swelling.  Gastrointestinal: Negative for nausea, vomiting, abdominal pain and blood in stool.  Genitourinary: Negative for dysuria, frequency and hematuria.  Musculoskeletal: Positive for back pain (Low back pain with bilateral hip pain X 4 months.). Negative for joint swelling.  Skin: Negative for rash.  Neurological: Negative for weakness and  numbness.  Hematological: Does not bruise/bleed easily.       Objective:   Physical Exam  Cardiovascular: Normal rate, regular rhythm and normal heart sounds.  Exam reveals no gallop and no friction rub.   No murmur heard. Pulmonary/Chest: Effort normal and breath sounds normal. No respiratory distress. She has no wheezes. She has no rales.  Abdominal: Soft. Bowel sounds are normal. She exhibits no distension. There is no hepatosplenomegaly. There is no tenderness. There is no rebound and no guarding.  Musculoskeletal: She exhibits no edema.       Right hip: Normal.       Left hip: Normal.  Neurological: She has normal strength. No sensory deficit.  Straight leg raise negative bilaterally.       Assessment & Plan:

## 2012-10-13 ENCOUNTER — Ambulatory Visit (HOSPITAL_BASED_OUTPATIENT_CLINIC_OR_DEPARTMENT_OTHER): Payer: Medicare Other | Admitting: Anesthesiology

## 2012-10-13 ENCOUNTER — Ambulatory Visit
Admission: RE | Admit: 2012-10-13 | Discharge: 2012-10-13 | Disposition: A | Discharge status: Home / Self Care | Payer: Medicare Other | Source: Ambulatory Visit | Attending: General Surgery | Admitting: General Surgery

## 2012-10-13 ENCOUNTER — Encounter (HOSPITAL_BASED_OUTPATIENT_CLINIC_OR_DEPARTMENT_OTHER)
Admission: RE | Disposition: A | Discharge status: Home / Self Care | Payer: Self-pay | Source: Ambulatory Visit | Attending: General Surgery

## 2012-10-13 ENCOUNTER — Ambulatory Visit (HOSPITAL_BASED_OUTPATIENT_CLINIC_OR_DEPARTMENT_OTHER)
Admission: RE | Admit: 2012-10-13 | Discharge: 2012-10-13 | Disposition: A | Discharge status: Home / Self Care | Payer: Medicare Other | Source: Ambulatory Visit | Attending: General Surgery | Admitting: General Surgery

## 2012-10-13 ENCOUNTER — Encounter (HOSPITAL_BASED_OUTPATIENT_CLINIC_OR_DEPARTMENT_OTHER): Payer: Self-pay | Admitting: Anesthesiology

## 2012-10-13 ENCOUNTER — Encounter (HOSPITAL_COMMUNITY): Payer: Medicare Other | Attending: General Surgery

## 2012-10-13 ENCOUNTER — Encounter (HOSPITAL_BASED_OUTPATIENT_CLINIC_OR_DEPARTMENT_OTHER): Payer: Self-pay

## 2012-10-13 ENCOUNTER — Telehealth: Payer: Self-pay | Admitting: Internal Medicine

## 2012-10-13 DIAGNOSIS — K219 Gastro-esophageal reflux disease without esophagitis: Secondary | ICD-10-CM | POA: Diagnosis not present

## 2012-10-13 DIAGNOSIS — D0590 Unspecified type of carcinoma in situ of unspecified breast: Secondary | ICD-10-CM | POA: Insufficient documentation

## 2012-10-13 DIAGNOSIS — Z853 Personal history of malignant neoplasm of breast: Secondary | ICD-10-CM | POA: Insufficient documentation

## 2012-10-13 DIAGNOSIS — I10 Essential (primary) hypertension: Secondary | ICD-10-CM | POA: Insufficient documentation

## 2012-10-13 DIAGNOSIS — C50311 Malignant neoplasm of lower-inner quadrant of right female breast: Secondary | ICD-10-CM

## 2012-10-13 DIAGNOSIS — C50919 Malignant neoplasm of unspecified site of unspecified female breast: Secondary | ICD-10-CM | POA: Diagnosis not present

## 2012-10-13 DIAGNOSIS — N289 Disorder of kidney and ureter, unspecified: Secondary | ICD-10-CM | POA: Insufficient documentation

## 2012-10-13 HISTORY — PX: BREAST LUMPECTOMY WITH NEEDLE LOCALIZATION AND AXILLARY SENTINEL LYMPH NODE BX: SHX5760

## 2012-10-13 HISTORY — DX: Presence of dental prosthetic device (complete) (partial): Z97.2

## 2012-10-13 LAB — POCT I-STAT, CHEM 8
BUN: 22 mg/dL (ref 6–23)
Calcium, Ion: 1.2 mmol/L (ref 1.13–1.30)
TCO2: 29 mmol/L (ref 0–100)

## 2012-10-13 LAB — COMPLETE METABOLIC PANEL WITH GFR
ALT: 40 U/L — ABNORMAL HIGH (ref 0–35)
AST: 37 U/L (ref 0–37)
Albumin: 4.2 g/dL (ref 3.5–5.2)
CO2: 30 mEq/L (ref 19–32)
Calcium: 9.7 mg/dL (ref 8.4–10.5)
Chloride: 97 mEq/L (ref 96–112)
Potassium: 4 mEq/L (ref 3.5–5.3)
Total Protein: 7.5 g/dL (ref 6.0–8.3)

## 2012-10-13 SURGERY — BREAST LUMPECTOMY WITH NEEDLE LOCALIZATION AND AXILLARY SENTINEL LYMPH NODE BX
Anesthesia: General | Site: Breast | Laterality: Right | Wound class: Clean

## 2012-10-13 MED ORDER — GLYCOPYRROLATE 0.2 MG/ML IJ SOLN
INTRAMUSCULAR | Status: DC | PRN
  Administered 2012-10-13: 0.2 mg via INTRAVENOUS

## 2012-10-13 MED ORDER — HYDROCODONE-ACETAMINOPHEN 5-325 MG PO TABS: 1.0000 | ORAL_TABLET | Freq: Four times a day (QID) | ORAL | Status: DC | PRN

## 2012-10-13 MED ORDER — FENTANYL CITRATE 0.05 MG/ML IJ SOLN
INTRAMUSCULAR | Status: DC | PRN
  Administered 2012-10-13: 50 ug via INTRAVENOUS

## 2012-10-13 MED ORDER — PROPOFOL 10 MG/ML IV BOLUS
INTRAVENOUS | Status: DC | PRN
  Administered 2012-10-13: 200 mg via INTRAVENOUS

## 2012-10-13 MED ORDER — ONDANSETRON HCL 4 MG/2ML IJ SOLN: 4.0000 mg | Freq: Four times a day (QID) | INTRAMUSCULAR | Status: DC | PRN

## 2012-10-13 MED ORDER — OXYCODONE HCL 5 MG PO TABS
5.0000 mg | ORAL_TABLET | Freq: Once | ORAL | Status: AC | PRN
  Administered 2012-10-13: 5 mg via ORAL

## 2012-10-13 MED ORDER — CEFAZOLIN SODIUM-DEXTROSE 2-3 GM-% IV SOLR
2.0000 g | INTRAVENOUS | Status: AC
  Administered 2012-10-13: 2 g via INTRAVENOUS

## 2012-10-13 MED ORDER — SUCCINYLCHOLINE CHLORIDE 20 MG/ML IJ SOLN
INTRAMUSCULAR | Status: DC | PRN
  Administered 2012-10-13: 100 mg via INTRAVENOUS

## 2012-10-13 MED ORDER — PHENYLEPHRINE HCL 10 MG/ML IJ SOLN
INTRAMUSCULAR | Status: DC | PRN
  Administered 2012-10-13 (×2): 40 ug via INTRAVENOUS

## 2012-10-13 MED ORDER — LIDOCAINE HCL (CARDIAC) 20 MG/ML IV SOLN
INTRAVENOUS | Status: DC | PRN
  Administered 2012-10-13: 80 mg via INTRAVENOUS

## 2012-10-13 MED ORDER — LACTATED RINGERS IV SOLN
INTRAVENOUS | Status: DC
  Administered 2012-10-13 (×2): via INTRAVENOUS

## 2012-10-13 MED ORDER — FENTANYL CITRATE 0.05 MG/ML IJ SOLN
25.0000 ug | INTRAMUSCULAR | Status: DC | PRN
  Administered 2012-10-13: 50 ug via INTRAVENOUS
  Administered 2012-10-13 (×2): 25 ug via INTRAVENOUS

## 2012-10-13 MED ORDER — DEXAMETHASONE SODIUM PHOSPHATE 4 MG/ML IJ SOLN
INTRAMUSCULAR | Status: DC | PRN
  Administered 2012-10-13: 4 mg via INTRAVENOUS

## 2012-10-13 MED ORDER — CHLORHEXIDINE GLUCONATE 4 % EX LIQD: 1.0000 "application " | Freq: Once | CUTANEOUS | Status: DC

## 2012-10-13 MED ORDER — OXYCODONE HCL 5 MG/5ML PO SOLN
5.0000 mg | Freq: Once | ORAL | Status: AC | PRN
Start: 2012-10-13 — End: 2012-10-13

## 2012-10-13 MED ORDER — BUPIVACAINE HCL (PF) 0.25 % IJ SOLN
INTRAMUSCULAR | Status: DC | PRN
  Administered 2012-10-13: 18 mL

## 2012-10-13 SURGICAL SUPPLY — 52 items
ADH SKN CLS APL DERMABOND .7 (GAUZE/BANDAGES/DRESSINGS) ×1
APPLIER CLIP 11 MED OPEN (CLIP)
APR CLP MED 11 20 MLT OPN (CLIP)
BLADE SURG 10 STRL SS (BLADE) ×2 IMPLANT
BLADE SURG 15 STRL LF DISP TIS (BLADE) ×1 IMPLANT
BLADE SURG 15 STRL SS (BLADE) ×2
CANISTER SUCTION 1200CC (MISCELLANEOUS) ×2 IMPLANT
CHLORAPREP W/TINT 26ML (MISCELLANEOUS) ×2 IMPLANT
CLIP APPLIE 11 MED OPEN (CLIP) IMPLANT
CLOTH BEACON ORANGE TIMEOUT ST (SAFETY) ×2 IMPLANT
COVER MAYO STAND STRL (DRAPES) ×2 IMPLANT
COVER PROBE W GEL 5X96 (DRAPES) ×2 IMPLANT
COVER TABLE BACK 60X90 (DRAPES) ×2 IMPLANT
DECANTER SPIKE VIAL GLASS SM (MISCELLANEOUS) ×1 IMPLANT
DERMABOND ADVANCED (GAUZE/BANDAGES/DRESSINGS) ×1
DERMABOND ADVANCED .7 DNX12 (GAUZE/BANDAGES/DRESSINGS) ×2 IMPLANT
DEVICE DUBIN W/COMP PLATE 8390 (MISCELLANEOUS) ×2 IMPLANT
DRAIN CHANNEL 19F RND (DRAIN) IMPLANT
DRAPE LAPAROSCOPIC ABDOMINAL (DRAPES) ×2 IMPLANT
DRAPE UTILITY XL STRL (DRAPES) ×2 IMPLANT
ELECT COATED BLADE 2.86 ST (ELECTRODE) ×2 IMPLANT
ELECT REM PT RETURN 9FT ADLT (ELECTROSURGICAL) ×2
ELECTRODE REM PT RTRN 9FT ADLT (ELECTROSURGICAL) ×1 IMPLANT
EVACUATOR SILICONE 100CC (DRAIN) IMPLANT
GLOVE BIO SURGEON STRL SZ 6.5 (GLOVE) ×2 IMPLANT
GLOVE BIO SURGEON STRL SZ7.5 (GLOVE) ×3 IMPLANT
GLOVE INDICATOR 7.0 STRL GRN (GLOVE) ×2 IMPLANT
GOWN PREVENTION PLUS XLARGE (GOWN DISPOSABLE) ×4 IMPLANT
KIT MARKER MARGIN INK (KITS) ×1 IMPLANT
NDL HYPO 25X1 1.5 SAFETY (NEEDLE) ×2 IMPLANT
NDL SAFETY ECLIPSE 18X1.5 (NEEDLE) ×1 IMPLANT
NEEDLE HYPO 18GX1.5 SHARP (NEEDLE)
NEEDLE HYPO 25X1 1.5 SAFETY (NEEDLE) ×2 IMPLANT
NS IRRIG 1000ML POUR BTL (IV SOLUTION) ×2 IMPLANT
PACK BASIN DAY SURGERY FS (CUSTOM PROCEDURE TRAY) ×2 IMPLANT
PENCIL BUTTON HOLSTER BLD 10FT (ELECTRODE) ×2 IMPLANT
PIN SAFETY STERILE (MISCELLANEOUS) IMPLANT
SLEEVE SCD COMPRESS KNEE MED (MISCELLANEOUS) ×2 IMPLANT
SPONGE LAP 18X18 X RAY DECT (DISPOSABLE) ×2 IMPLANT
SPONGE LAP 4X18 X RAY DECT (DISPOSABLE) IMPLANT
STAPLER VISISTAT 35W (STAPLE) IMPLANT
SUT ETHILON 3 0 FSL (SUTURE) IMPLANT
SUT MON AB 4-0 PC3 18 (SUTURE) ×3 IMPLANT
SUT SILK 3 0 PS 1 (SUTURE) IMPLANT
SUT VIC AB 3-0 54X BRD REEL (SUTURE) ×2 IMPLANT
SUT VIC AB 3-0 BRD 54 (SUTURE)
SUT VICRYL 3-0 CR8 SH (SUTURE) ×2 IMPLANT
SYR CONTROL 10ML LL (SYRINGE) ×3 IMPLANT
TOWEL OR 17X24 6PK STRL BLUE (TOWEL DISPOSABLE) ×3 IMPLANT
TOWEL OR NON WOVEN STRL DISP B (DISPOSABLE) ×2 IMPLANT
TUBE CONNECTING 20X1/4 (TUBING) ×2 IMPLANT
YANKAUER SUCT BULB TIP NO VENT (SUCTIONS) ×2 IMPLANT

## 2012-10-13 NOTE — Telephone Encounter (Signed)
Patient's creatinine yesterday returned high at 2.44; I called the [facility] and spoke with RN there who had already informed the anesthesiologist and surgeon of the abnormal result.  Their plan when we spoke was to recheck patient's creatinine on arrival; I advised that if the creatinine is confirmed as elevated on repeat, then they should contact me for further management.  However, the repeat creatinine today returned with a value of 1.30.

## 2012-10-13 NOTE — Anesthesia Procedure Notes (Signed)
Procedure Name: Intubation Date/Time: 10/13/2012 1:13 PM Performed by: [place] Pre-anesthesia Checklist: Patient identified, Emergency Drugs available, Suction available and Patient being monitored Patient Re-evaluated:Patient Re-evaluated prior to inductionOxygen Delivery Method: Circle System Utilized Preoxygenation: Pre-oxygenation with 100% oxygen Intubation Type: IV induction Ventilation: Mask ventilation without difficulty Laryngoscope Size: Miller and 2 Grade View: Grade I Tube type: Oral Tube size: 7.0 mm Number of attempts: 3 (#4 LMA inserted but unable to seat,  replaced with #4 Supreme  but unable to obtain good weal.  OETT placed easily. +EtCO2, BSBE) Airway Equipment and Method: oral airway Placement Confirmation: ETT inserted through vocal cords under direct vision,  positive ETCO2 and breath sounds checked- equal and bilateral Secured at: 22 cm Tube secured with: Tape Dental Injury: Teeth and Oropharynx as per pre-operative assessment

## 2012-10-13 NOTE — Anesthesia Postprocedure Evaluation (Signed)
Anesthesia Post Note  Patient: Monique Henderson  Procedure(s) Performed: Procedure(s) (LRB): BREAST LUMPECTOMY WITH NEEDLE LOCALIZATION (Right)  Anesthesia type: General  Patient location: PACU  Post pain: Pain level controlled and Adequate analgesia  Post assessment: Post-op Vital signs reviewed, Patient's Cardiovascular Status Stable, Respiratory Function Stable, Patent Airway and Pain level controlled  Last Vitals:  Filed Vitals:   10/13/12 1415  BP: 154/92  Pulse: 78  Temp:   Resp: 14    Post vital signs: Reviewed and stable  Level of consciousness: awake, alert  and oriented  Complications: No apparent anesthesia complications

## 2012-10-13 NOTE — Anesthesia Preprocedure Evaluation (Signed)
Anesthesia Evaluation  Patient identified by MRN, date of birth, ID band Patient awake    Reviewed: Allergy & Precautions, H&P , NPO status , Patient's Chart, lab work & pertinent test results  Airway Mallampati: II  Neck ROM: full    Dental   Pulmonary          Cardiovascular hypertension,     Neuro/Psych    GI/Hepatic GERD-  ,(+) Hepatitis -, C  Endo/Other    Renal/GU Renal InsufficiencyRenal disease     Musculoskeletal   Abdominal   Peds  Hematology   Anesthesia Other Findings   Reproductive/Obstetrics                           Anesthesia Physical Anesthesia Plan  ASA: III  Anesthesia Plan: General   Post-op Pain Management:    Induction: Intravenous  Airway Management Planned: LMA  Additional Equipment:   Intra-op Plan:   Post-operative Plan:   Informed Consent: I have reviewed the patients History and Physical, chart, labs and discussed the procedure including the risks, benefits and alternatives for the proposed anesthesia with the patient or authorized representative who has indicated his/her understanding and acceptance.     Plan Discussed with: CRNA, Anesthesiologist and Surgeon  Anesthesia Plan Comments:         Anesthesia Quick Evaluation

## 2012-10-13 NOTE — Op Note (Signed)
10/13/2012  1:40 PM  PATIENT:  Monique Henderson  62 y.o. female  PRE-OPERATIVE DIAGNOSIS:  right breast dcis  POST-OPERATIVE DIAGNOSIS:  right breast dcis  PROCEDURE:  Procedure(s) with comments: BREAST LUMPECTOMY WITH NEEDLE LOCALIZATION (Right) - Right breast wire localized lumpectomy  SURGEON:  Surgeon(s) and Role:    * [name], MD - Primary  PHYSICIAN ASSISTANT:   ASSISTANTS: none   ANESTHESIA:   general  EBL:  Total I/O In: 1000 [I.V.:1000] Out: -   BLOOD ADMINISTERED:none  DRAINS: none   LOCAL MEDICATIONS USED:  MARCAINE     SPECIMEN:  Source of Specimen:  right breast tissue  DISPOSITION OF SPECIMEN:  PATHOLOGY  COUNTS:  YES  TOURNIQUET:  * No tourniquets in log *  DICTATION: .Dragon Dictation After informed consent was obtained the patient was brought to the operating room and placed in the supine position on the operating room table. After adequate induction of general anesthesia the patient's right breast was prepped with ChloraPrep, allowed to dry, and draped in the usual sterile manner. Early on the day the patient underwent a wire localization procedure and the wire was entering the right breast in the 3:00 position and headed medially. An elliptical incision was made around the entry site of the wire with a 15 blade knife. This incision was carried through the skin and subcutaneous tissue sharply with the electrocautery. A circular portion of breast tissue was excised sharply around the path of the wire. Once the specimen was removed it was oriented according to the assigned paint colors. A specimen radiograph was then obtained that showed the clip and calcifications to be in the center of the specimen. The specimen was then sent to pathology for further evaluation. Hemostasis was achieved using the Bovie electrocautery. The wound was infiltrated with quarter percent Marcaine and irrigated with copious amounts of saline. The deep layer the wound was  then closed with interrupted 3-0 Vicryl stitches. The skin was then closed with interrupted 4-0 Monocryl subcuticular stitches. Dermabond dressings were applied. The patient tolerated the procedure well. At the end of the case all needle sponge and instrument counts are correct. The patient was then awakened and taken to recovery in stable condition.  PLAN OF CARE: Discharge to home after PACU  PATIENT DISPOSITION:  PACU - hemodynamically stable.   Delay start of Pharmacological VTE agent (>24hrs) due to surgical blood loss or risk of bleeding: not applicable

## 2012-10-13 NOTE — Interval H&P Note (Signed)
History and Physical Interval Note:  10/13/2012 11:22 AM  Monique Henderson  has presented today for surgery, with the diagnosis of right breast dcis  The various methods of treatment have been discussed with the patient and family. After consideration of risks, benefits and other options for treatment, the patient has consented to  Procedure(s): BREAST LUMPECTOMY WITH NEEDLE LOCALIZATION AND AXILLARY SENTINEL LYMPH NODE BX (Right) as a surgical intervention .  The patient's history has been reviewed, patient examined, no change in status, stable for surgery.  I have reviewed the patient's chart and labs.  Questions were answered to the patient's satisfaction.     TOTH III,Milli Woolridge S

## 2012-10-13 NOTE — Progress Notes (Signed)
Spoke with Dr Ola Spurr about abnormal lab values.  Order for [facility] repeat dos.  Also sent email to dr toth to check value.

## 2012-10-13 NOTE — Transfer of Care (Signed)
Immediate Anesthesia Transfer of Care Note  Patient: Monique Henderson  Procedure(s) Performed: Procedure(s) with comments: BREAST LUMPECTOMY WITH NEEDLE LOCALIZATION (Right) - Right breast wire localized lumpectomy  Patient Location: PACU  Anesthesia Type:General  Level of Consciousness: awake, alert , oriented and patient cooperative  Airway & Oxygen Therapy: Patient Spontanous Breathing and Patient connected to face mask oxygen  Post-op Assessment: Report given to PACU RN, Post -op Vital signs reviewed and stable and Patient moving all extremities  Post vital signs: Reviewed and stable  Complications: No apparent anesthesia complications

## 2012-10-13 NOTE — Transfer of Care (Deleted)
Immediate Anesthesia Transfer of Care Note  Patient: Monique Henderson  Procedure(s) Performed: Procedure(s) with comments: BREAST LUMPECTOMY WITH NEEDLE LOCALIZATION (Right) - Right breast wire localized lumpectomy  Patient Location: PACU  Anesthesia Type:General  Level of Consciousness: awake, alert  and oriented  Airway & Oxygen Therapy: Patient Spontanous Breathing and Patient connected to face mask oxygen  Post-op Assessment: Report given to PACU RN, Post -op Vital signs reviewed and stable and Patient moving all extremities  Post vital signs: Reviewed and stable  Complications: No apparent anesthesia complications

## 2012-10-13 NOTE — H&P (View-Only) (Signed)
Subjective:     Patient ID: Monique Henderson, female   DOB: 10/19/1950, 62 y.o.   MRN: [id number]  HPI We are asked to see the pt in consultation by Dr. Owens Shark to evaluate her for right breast cancer. She recently went for a routine mammogram at which time some abnormal calcifications were found. 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 She noted some stinging sensation in her breast prior to this but no discharge from the nipple. She has been treated for left sided breast cancer in the past.  Review of Systems  Constitutional: Negative.   HENT: Negative.   Eyes: Negative.   Respiratory: Negative.   Cardiovascular: Negative.   Gastrointestinal: Negative.   Endocrine: Negative.   Genitourinary: Negative.   Musculoskeletal: Negative.   Skin: Negative.   Allergic/Immunologic: Negative.   Neurological: Negative.   Hematological: Negative.   Psychiatric/Behavioral: Negative.        Objective:   Physical Exam  Constitutional: She is oriented to person, place, and time. She appears well-developed and well-nourished.  HENT:  Head: Normocephalic and atraumatic.  Eyes: Conjunctivae and EOM are normal. Pupils are equal, round, and reactive to light.  Neck: Normal range of motion. Neck supple.  Cardiovascular: Normal rate, regular rhythm and normal heart sounds.   Pulmonary/Chest: Effort normal and breath sounds normal.  There is no palpable mass in either breast except for some bruising in the medial right breast. There is no palpable axillary, supraclavicular, or cervical lymphadenopathy  Abdominal: Soft. Bowel sounds are normal. She exhibits no mass. There is no tenderness.  Musculoskeletal: Normal range of motion.  Lymphadenopathy:    She has no cervical adenopathy.  Neurological: She is alert and oriented to person, place, and time.  Skin: Skin is warm and dry.  Psychiatric: She has a normal mood and affect. Her behavior is normal.        Assessment:     The pt has a small area of dcis with possible invasion in the inner right breast. I have discussed with her in detail the different treatment options and she favors breast conservation. I think this is a reasonable choice. I have discussed with her the risks and benefits of surgery as well as some of the technical aspects and she understands and wishes to proceed     Plan:     Plan for right breast wire localized lumpectomy and sentinel node mapping

## 2012-10-14 ENCOUNTER — Other Ambulatory Visit: Payer: Self-pay | Admitting: Emergency Medicine

## 2012-10-14 ENCOUNTER — Encounter (HOSPITAL_BASED_OUTPATIENT_CLINIC_OR_DEPARTMENT_OTHER): Payer: Self-pay | Admitting: General Surgery

## 2012-10-14 DIAGNOSIS — C50319 Malignant neoplasm of lower-inner quadrant of unspecified female breast: Secondary | ICD-10-CM

## 2012-10-17 ENCOUNTER — Other Ambulatory Visit: Payer: Medicare Other | Admitting: Lab

## 2012-10-17 ENCOUNTER — Ambulatory Visit (HOSPITAL_BASED_OUTPATIENT_CLINIC_OR_DEPARTMENT_OTHER): Payer: Medicare Other | Admitting: Genetic Counselor

## 2012-10-17 ENCOUNTER — Encounter: Payer: Self-pay | Admitting: Genetic Counselor

## 2012-10-17 DIAGNOSIS — C50311 Malignant neoplasm of lower-inner quadrant of right female breast: Secondary | ICD-10-CM

## 2012-10-17 DIAGNOSIS — C50319 Malignant neoplasm of lower-inner quadrant of unspecified female breast: Secondary | ICD-10-CM

## 2012-10-17 NOTE — Progress Notes (Signed)
Dr. Marcy Panning requested a consultation for genetic counseling and risk assessment for Monique Henderson, a 62 y.o. female, for discussion of her personal history of bilateral breast cancer and family history of colon cancer. She presents to clinic today to discuss the possibility of a genetic predisposition to cancer, and to further clarify her risks, as well as her family members' risks for cancer.   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 This was treated with lumpectomy and radiation.  In 2014, at the age of 38, she was diagnosed with breast cancer in the right breast.  She has recently undergone lumpectomy and is unsure if she will do radiation.    Past Medical History  Diagnosis Date  . GERD (gastroesophageal reflux disease)   . Hepatitis C   . Hypertension   . Low back pain   . Osteoarthritis   . Uterine fibroid   . Normocytic anemia     With thrombocytosis  . Right ureteral stone 2002  . Atelectasis 2002    Bilateral  . Bone spur 2008    Right calcaneal foot spur  . Lumbar spinal stenosis     S/P lumbar decompressive laminectomy, fusion and plating for lumbar spinal stensosis  . Breast cancer 2004    Ductal carcinoma in situ of the left breast; S/P left partial mastectomy 02/26/2003; S/P re-excision of cranial and lateral margins11/18/2004.  Marland Kitchen Hot flashes   . Wears dentures     top    Past Surgical History  Procedure Laterality Date  . Laminectomy  05/27/2009    Lumbar decompressive laminectomy, fusion and plating for lumbar spinal stensosis  . Back surgery    . Breast lumpectomy  2011    lt-  . Mastectomy, partial  02/26/2003    S/P left partial mastectomy 02/26/2003; S/P re-excision of cranial and lateral margins11/18/2004.   . Breast lumpectomy with needle localization and axillary sentinel lymph node bx Right 10/13/2012    Procedure: BREAST LUMPECTOMY WITH NEEDLE LOCALIZATION;   Surgeon: [name], MD;  Location: [facility];  Service: General;  Laterality: Right;  Right breast wire localized lumpectomy    History  Substance Use Topics  . Smoking status: Current Some Day Smoker -- 0.20 packs/day for 32 years    Types: Cigarettes  . Smokeless tobacco: Never Used  . Alcohol Use: 0.0 oz/week    1-2 Cans of beer per week     Comment: Occasional     REPRODUCTIVE HISTORY AND PERSONAL RISK ASSESSMENT FACTORS: Menarche was at age 69.   Perimenopause Uterus Intact: Yes Ovaries Intact: Yes G2P2A0 , first live birth at age 82  She has not previously undergone treatment for infertility.   OCP use for 1 year   She has not used HRT in the past.    FAMILY HISTORY:  We obtained a detailed, 4-generation family history.  Significant diagnoses are listed below: Family History  Problem Relation Age of Onset  . Colon cancer Mother 24  . Hypertension Mother   . Diabetes Sister 69  . Hypertension Sister   . Breast cancer Neg Hx   . Cervical cancer Neg Hx   . Diabetes Brother   . Hypertension Brother   . Diabetes Brother   . Hypertension Brother   . Kidney disease Son     On dialysis  . Hypertension Son   . Diabetes Son   .  Multiple sclerosis Son     Patient's maternal ancestors are of African American descent, and paternal ancestors are of African American descent. There is no reported Ashkenazi Jewish ancestry. There is no  known consanguinity.  GENETIC COUNSELING RISK ASSESSMENT, DISCUSSION, AND SUGGESTED FOLLOW UP: We reviewed the natural history and genetic etiology of sporadic, familial and hereditary cancer syndromes.  About 5-10% of breast cancer is hereditary.  Of this, about 85% is the result of a BRCA1 or BRCA2 mutation.  We reviewed the red flags of hereditary cancer syndromes and the dominant inheritance patterns. Based on her family history, there does not appear to be a need for further panel testing.  The patient's personal  history is suggestive of the following possible diagnosis: hereditary breast cancer  We discussed that identification of a hereditary cancer syndrome may help her care providers tailor the patients medical management. If a mutation indicating hereditary breast cancer  is detected in this case, the [employer]  recommendations would include increased cancer surveillance and possible prophylactic surgery. If a mutation is detected, the patient will be referred back to the referring provider and to any additional appropriate care providers to discuss the relevant options.   If a mutation is not found in the patient, this will decrease the likelihood of a hereditary cancer syndrome as the explanation for her breast cancer. Cancer surveillance options would be discussed for the patient according to the appropriate standard [place] guidelines, with consideration of their personal and family history risk factors. In this case, the patient will be referred back to their care providers for discussions of management.   After considering the risks, benefits, and limitations, the patient declined testing.   The patient was seen for a total of 60 minutes, greater than 50% of which was spent face-to-face counseling.  This plan is being carried out per Dr. Marcy Panning recommendations.  This note will also be sent to the referring provider via the electronic medical record. The patient will be supplied with a summary of this genetic counseling discussion as well as educational information on the discussed hereditary cancer syndromes following the conclusion of their visit.   Patient was discussed with Dr. Marcy Panning.   _______________________________________________________________________ For Office Staff:  Number of people involved in session: 2 Was an Intern/ student involved with case: no }

## 2012-10-18 ENCOUNTER — Telehealth (INDEPENDENT_AMBULATORY_CARE_PROVIDER_SITE_OTHER): Payer: Self-pay

## 2012-10-18 ENCOUNTER — Other Ambulatory Visit (INDEPENDENT_AMBULATORY_CARE_PROVIDER_SITE_OTHER): Payer: Self-pay | Admitting: General Surgery

## 2012-10-18 NOTE — Telephone Encounter (Signed)
Called patient to let her know I gave orders to schedulers and they will call her in the next day r two to schedule. i also reminded her no asa, blood thinners or herbal meds 5 days before surgery.

## 2012-10-25 ENCOUNTER — Encounter (HOSPITAL_COMMUNITY): Payer: Self-pay | Admitting: *Deleted

## 2012-10-26 ENCOUNTER — Other Ambulatory Visit: Payer: Self-pay | Admitting: Internal Medicine

## 2012-10-26 ENCOUNTER — Encounter (INDEPENDENT_AMBULATORY_CARE_PROVIDER_SITE_OTHER): Payer: Medicare Other | Admitting: General Surgery

## 2012-10-26 MED ORDER — CHLORHEXIDINE GLUCONATE 4 % EX LIQD: 1.0000 "application " | Freq: Once | CUTANEOUS | Status: DC

## 2012-10-26 MED ORDER — CEFAZOLIN SODIUM-DEXTROSE 2-3 GM-% IV SOLR
2.0000 g | INTRAVENOUS | Status: AC
  Administered 2012-10-27: 2 g via INTRAVENOUS
  Filled 2012-10-26 (×2): qty 50

## 2012-10-26 NOTE — Telephone Encounter (Signed)
Refill approved - nurse to complete. 

## 2012-10-27 ENCOUNTER — Ambulatory Visit: Payer: Medicare Other | Admitting: Oncology

## 2012-10-27 ENCOUNTER — Ambulatory Visit: Payer: Medicare Other | Admitting: Radiation Oncology

## 2012-10-27 ENCOUNTER — Ambulatory Visit (HOSPITAL_COMMUNITY)
Admission: RE | Admit: 2012-10-27 | Discharge: 2012-10-27 | Disposition: A | Discharge status: Home / Self Care | Payer: Medicare Other | Source: Ambulatory Visit | Attending: General Surgery | Admitting: General Surgery

## 2012-10-27 ENCOUNTER — Encounter (HOSPITAL_COMMUNITY)
Admission: RE | Disposition: A | Discharge status: Home / Self Care | Payer: Self-pay | Source: Ambulatory Visit | Attending: General Surgery

## 2012-10-27 ENCOUNTER — Ambulatory Visit (HOSPITAL_COMMUNITY): Payer: Medicare Other | Admitting: Anesthesiology

## 2012-10-27 ENCOUNTER — Encounter (HOSPITAL_COMMUNITY): Payer: Self-pay | Admitting: Anesthesiology

## 2012-10-27 ENCOUNTER — Telehealth (INDEPENDENT_AMBULATORY_CARE_PROVIDER_SITE_OTHER): Payer: Self-pay | Admitting: General Surgery

## 2012-10-27 ENCOUNTER — Telehealth: Payer: Self-pay | Admitting: Emergency Medicine

## 2012-10-27 DIAGNOSIS — K219 Gastro-esophageal reflux disease without esophagitis: Secondary | ICD-10-CM | POA: Insufficient documentation

## 2012-10-27 DIAGNOSIS — N6019 Diffuse cystic mastopathy of unspecified breast: Secondary | ICD-10-CM | POA: Diagnosis not present

## 2012-10-27 DIAGNOSIS — F489 Nonpsychotic mental disorder, unspecified: Secondary | ICD-10-CM | POA: Insufficient documentation

## 2012-10-27 DIAGNOSIS — M549 Dorsalgia, unspecified: Secondary | ICD-10-CM | POA: Diagnosis not present

## 2012-10-27 DIAGNOSIS — D0590 Unspecified type of carcinoma in situ of unspecified breast: Secondary | ICD-10-CM | POA: Diagnosis not present

## 2012-10-27 DIAGNOSIS — B1920 Unspecified viral hepatitis C without hepatic coma: Secondary | ICD-10-CM | POA: Insufficient documentation

## 2012-10-27 DIAGNOSIS — D649 Anemia, unspecified: Secondary | ICD-10-CM | POA: Diagnosis not present

## 2012-10-27 DIAGNOSIS — N641 Fat necrosis of breast: Secondary | ICD-10-CM | POA: Diagnosis not present

## 2012-10-27 DIAGNOSIS — Z853 Personal history of malignant neoplasm of breast: Secondary | ICD-10-CM | POA: Insufficient documentation

## 2012-10-27 DIAGNOSIS — N289 Disorder of kidney and ureter, unspecified: Secondary | ICD-10-CM | POA: Insufficient documentation

## 2012-10-27 DIAGNOSIS — N6039 Fibrosclerosis of unspecified breast: Secondary | ICD-10-CM | POA: Insufficient documentation

## 2012-10-27 DIAGNOSIS — C50311 Malignant neoplasm of lower-inner quadrant of right female breast: Secondary | ICD-10-CM

## 2012-10-27 DIAGNOSIS — I10 Essential (primary) hypertension: Secondary | ICD-10-CM | POA: Insufficient documentation

## 2012-10-27 DIAGNOSIS — L089 Local infection of the skin and subcutaneous tissue, unspecified: Secondary | ICD-10-CM | POA: Diagnosis not present

## 2012-10-27 DIAGNOSIS — D759 Disease of blood and blood-forming organs, unspecified: Secondary | ICD-10-CM | POA: Diagnosis not present

## 2012-10-27 DIAGNOSIS — K759 Inflammatory liver disease, unspecified: Secondary | ICD-10-CM | POA: Diagnosis not present

## 2012-10-27 HISTORY — PX: RE-EXCISION OF BREAST CANCER,SUPERIOR MARGINS: SHX6047

## 2012-10-27 LAB — HEPATIC FUNCTION PANEL
ALT: 36 U/L — ABNORMAL HIGH (ref 0–35)
Alkaline Phosphatase: 84 U/L (ref 39–117)
Total Protein: 6.9 g/dL (ref 6.0–8.3)

## 2012-10-27 LAB — CBC
MCH: 29.8 pg (ref 26.0–34.0)
MCHC: 33 g/dL (ref 30.0–36.0)
MCV: 90.3 fL (ref 78.0–100.0)
Platelets: 379 10*3/uL (ref 150–400)
RBC: 4.03 MIL/uL (ref 3.87–5.11)

## 2012-10-27 LAB — BASIC METABOLIC PANEL
BUN: 32 mg/dL — ABNORMAL HIGH (ref 6–23)
CO2: 25 mEq/L (ref 19–32)
Calcium: 9.4 mg/dL (ref 8.4–10.5)
Creatinine, Ser: 1.65 mg/dL — ABNORMAL HIGH (ref 0.50–1.10)
Glucose, Bld: 98 mg/dL (ref 70–99)
Sodium: 138 mEq/L (ref 135–145)

## 2012-10-27 SURGERY — RE-EXCISION OF BREAST CANCER,SUPERIOR MARGINS
Anesthesia: General | Site: Breast | Laterality: Right | Wound class: Clean

## 2012-10-27 MED ORDER — OXYCODONE HCL 5 MG/5ML PO SOLN: 5.0000 mg | Freq: Once | ORAL | Status: AC | PRN

## 2012-10-27 MED ORDER — ONDANSETRON HCL 4 MG/2ML IJ SOLN
INTRAMUSCULAR | Status: DC | PRN
  Administered 2012-10-27: 4 mg via INTRAVENOUS

## 2012-10-27 MED ORDER — FENTANYL CITRATE 0.05 MG/ML IJ SOLN
INTRAMUSCULAR | Status: DC | PRN
  Administered 2012-10-27 (×4): 50 ug via INTRAVENOUS

## 2012-10-27 MED ORDER — MUPIROCIN 2 % EX OINT
TOPICAL_OINTMENT | Freq: Two times a day (BID) | CUTANEOUS | Status: DC
  Administered 2012-10-27: 1 via NASAL
  Filled 2012-10-27: qty 22

## 2012-10-27 MED ORDER — BUPIVACAINE HCL (PF) 0.25 % IJ SOLN
INTRAMUSCULAR | Status: DC | PRN
  Administered 2012-10-27: 20 mL

## 2012-10-27 MED ORDER — LIDOCAINE HCL (CARDIAC) 20 MG/ML IV SOLN
INTRAVENOUS | Status: DC | PRN
  Administered 2012-10-27: 50 mg via INTRAVENOUS

## 2012-10-27 MED ORDER — PROPOFOL 10 MG/ML IV BOLUS
INTRAVENOUS | Status: DC | PRN
  Administered 2012-10-27: 150 mg via INTRAVENOUS

## 2012-10-27 MED ORDER — MORPHINE SULFATE 2 MG/ML IJ SOLN
INTRAMUSCULAR | Status: AC
  Filled 2012-10-27: qty 2

## 2012-10-27 MED ORDER — MEPERIDINE HCL 25 MG/ML IJ SOLN: 6.2500 mg | INTRAMUSCULAR | Status: DC | PRN

## 2012-10-27 MED ORDER — 0.9 % SODIUM CHLORIDE (POUR BTL) OPTIME
TOPICAL | Status: DC | PRN
  Administered 2012-10-27: 1000 mL

## 2012-10-27 MED ORDER — MORPHINE SULFATE 4 MG/ML IJ SOLN
INTRAMUSCULAR | Status: AC
  Filled 2012-10-27: qty 1

## 2012-10-27 MED ORDER — MIDAZOLAM HCL 5 MG/5ML IJ SOLN
INTRAMUSCULAR | Status: DC | PRN
  Administered 2012-10-27: 2 mg via INTRAVENOUS

## 2012-10-27 MED ORDER — BUPIVACAINE HCL (PF) 0.25 % IJ SOLN
INTRAMUSCULAR | Status: AC
  Filled 2012-10-27: qty 30

## 2012-10-27 MED ORDER — OXYCODONE HCL 5 MG PO TABS
ORAL_TABLET | ORAL | Status: AC
  Filled 2012-10-27: qty 1

## 2012-10-27 MED ORDER — MORPHINE SULFATE 2 MG/ML IJ SOLN
1.0000 mg | INTRAMUSCULAR | Status: DC | PRN
  Administered 2012-10-27 (×3): 2 mg via INTRAVENOUS

## 2012-10-27 MED ORDER — OXYCODONE HCL 5 MG PO TABS
5.0000 mg | ORAL_TABLET | Freq: Once | ORAL | Status: AC | PRN
  Administered 2012-10-27: 5 mg via ORAL

## 2012-10-27 MED ORDER — LACTATED RINGERS IV SOLN
INTRAVENOUS | Status: DC | PRN
  Administered 2012-10-27: 07:00:00 via INTRAVENOUS

## 2012-10-27 MED ORDER — OXYCODONE-ACETAMINOPHEN 5-325 MG PO TABS: 1.0000 | ORAL_TABLET | ORAL | Status: DC | PRN

## 2012-10-27 SURGICAL SUPPLY — 47 items
ADH SKN CLS APL DERMABOND .7 (GAUZE/BANDAGES/DRESSINGS) ×1
APPLIER CLIP 9.375 MED OPEN (MISCELLANEOUS) ×2
APR CLP MED 9.3 20 MLT OPN (MISCELLANEOUS) ×1
BINDER BREAST LRG (GAUZE/BANDAGES/DRESSINGS) ×1 IMPLANT
BINDER BREAST XLRG (GAUZE/BANDAGES/DRESSINGS) IMPLANT
BLADE SURG 15 STRL LF DISP TIS (BLADE) ×1 IMPLANT
BLADE SURG 15 STRL SS (BLADE) ×2
CHLORAPREP W/TINT 26ML (MISCELLANEOUS) ×2 IMPLANT
CLIP APPLIE 9.375 MED OPEN (MISCELLANEOUS) IMPLANT
CLOTH BEACON ORANGE TIMEOUT ST (SAFETY) ×2 IMPLANT
CONT SPEC 4OZ CLIKSEAL STRL BL (MISCELLANEOUS) IMPLANT
COVER SURGICAL LIGHT HANDLE (MISCELLANEOUS) ×2 IMPLANT
DECANTER SPIKE VIAL GLASS SM (MISCELLANEOUS) ×2 IMPLANT
DERMABOND ADVANCED (GAUZE/BANDAGES/DRESSINGS) ×1
DERMABOND ADVANCED .7 DNX12 (GAUZE/BANDAGES/DRESSINGS) ×1 IMPLANT
DRAPE CHEST BREAST 15X10 FENES (DRAPES) ×2 IMPLANT
DRAPE UTILITY 15X26 W/TAPE STR (DRAPE) ×4 IMPLANT
ELECT CAUTERY BLADE 6.4 (BLADE) ×2 IMPLANT
ELECT COATED BLADE 2.86 ST (ELECTRODE) ×1 IMPLANT
ELECT REM PT RETURN 9FT ADLT (ELECTROSURGICAL) ×2
ELECTRODE REM PT RTRN 9FT ADLT (ELECTROSURGICAL) ×1 IMPLANT
GLOVE BIO SURGEON STRL SZ7.5 (GLOVE) ×2 IMPLANT
GLOVE BIOGEL PI IND STRL 6 (GLOVE) IMPLANT
GLOVE BIOGEL PI INDICATOR 6 (GLOVE) ×1
GLOVE ECLIPSE 6.0 STRL STRAW (GLOVE) ×1 IMPLANT
GLOVE SURG SS PI 7.0 STRL IVOR (GLOVE) ×1 IMPLANT
GOWN STRL NON-REIN LRG LVL3 (GOWN DISPOSABLE) ×4 IMPLANT
KIT BASIN OR (CUSTOM PROCEDURE TRAY) ×2 IMPLANT
KIT ROOM TURNOVER OR (KITS) ×2 IMPLANT
NDL HYPO 25GX1X1/2 BEV (NEEDLE) ×1 IMPLANT
NEEDLE HYPO 25GX1X1/2 BEV (NEEDLE) ×2 IMPLANT
NS IRRIG 1000ML POUR BTL (IV SOLUTION) ×2 IMPLANT
PACK SURGICAL SETUP 50X90 (CUSTOM PROCEDURE TRAY) ×2 IMPLANT
PAD ARMBOARD 7.5X6 YLW CONV (MISCELLANEOUS) ×2 IMPLANT
PENCIL BUTTON HOLSTER BLD 10FT (ELECTRODE) ×2 IMPLANT
SPONGE GAUZE 4X4 12PLY (GAUZE/BANDAGES/DRESSINGS) ×1 IMPLANT
SPONGE LAP 18X18 X RAY DECT (DISPOSABLE) ×2 IMPLANT
SUT MNCRL AB 4-0 PS2 18 (SUTURE) ×3 IMPLANT
SUT VIC AB 3-0 SH 18 (SUTURE) ×1 IMPLANT
SUT VIC AB 3-0 SH 27 (SUTURE) ×2
SUT VIC AB 3-0 SH 27XBRD (SUTURE) ×1 IMPLANT
SYR BULB 3OZ (MISCELLANEOUS) ×1 IMPLANT
SYR CONTROL 10ML LL (SYRINGE) ×2 IMPLANT
TOWEL OR 17X24 6PK STRL BLUE (TOWEL DISPOSABLE) ×2 IMPLANT
TOWEL OR 17X26 10 PK STRL BLUE (TOWEL DISPOSABLE) ×2 IMPLANT
TUBE CONNECTING 20X1/4 (TUBING) ×1 IMPLANT
YANKAUER SUCT BULB TIP NO VENT (SUCTIONS) ×1 IMPLANT

## 2012-10-27 NOTE — Anesthesia Procedure Notes (Signed)
Procedure Name: LMA Insertion Date/Time: 10/27/2012 7:41 AM Performed by: Neldon Newport Pre-anesthesia Checklist: Patient identified, Timeout performed, Emergency Drugs available, Suction available and Patient being monitored Patient Re-evaluated:Patient Re-evaluated prior to inductionOxygen Delivery Method: Circle system utilized Preoxygenation: Pre-oxygenation with 100% oxygen Intubation Type: IV induction Ventilation: Mask ventilation without difficulty LMA: LMA inserted LMA Size: 4.0 Number of attempts: 2 Placement Confirmation: positive ETCO2 and breath sounds checked- equal and bilateral Tube secured with: Tape Dental Injury: Teeth and Oropharynx as per pre-operative assessment

## 2012-10-27 NOTE — Op Note (Signed)
10/27/2012  8:50 AM  PATIENT:  Monique Henderson  62 y.o. female  PRE-OPERATIVE DIAGNOSIS:  right DCIS  POST-OPERATIVE DIAGNOSIS:  right DCIS  PROCEDURE:  Procedure(s): RE-EXCISION OF BREAST CANCER,SUPERIOR and inferior MARGINS (Right)  SURGEON:  Surgeon(s) and Role:    * [name], MD - Primary  PHYSICIAN ASSISTANT:   ASSISTANTS: none   ANESTHESIA:   general  EBL:     BLOOD ADMINISTERED:none  DRAINS: none   LOCAL MEDICATIONS USED:  MARCAINE     SPECIMEN:  Source of Specimen:  right breast superior and inferior margins  DISPOSITION OF SPECIMEN:  PATHOLOGY  COUNTS:  YES  TOURNIQUET:  * No tourniquets in log *  DICTATION: .Dragon Dictation After informed consent was obtained the patient was brought to the operating room and placed in the supine position on the operating room table. After adequate induction of general anesthesia the patient's right breast was prepped with ChloraPrep, allowed to dry, and draped in usual sterile manner. An elliptical incision was made with a 15 blade knife around her old incision. This incision was carried through the skin and subcutaneous tissue sharply with the electrocautery until the seroma cavity was entered. The superior margin was excised sharply with the electrocautery in continuity with the skin and anterior margin. The dissection was carried all the way to the chest wall. The new specimen was marked with this stitch on the new true superior margin. The specimen was then sent to pathology for further evaluation. The inferior margin was also excised sharply with the electrocautery down to the chest wall. Once it was removed it was oriented with a stitch on the new groove inferior margin. This was also sent to pathology for further evaluation. Hemostasis was achieved using the Bovie electrocautery. The wound was then infiltrated with quarter percent Marcaine and irrigated with copious amounts of saline. The deep layer the wound was then  closed with interrupted 3-0 Vicryl stitches. The skin was then closed with interrupted 4-0 Monocryl subcuticular stitches. Dermabond dressings were applied. The patient tolerated the procedure well. At the end of the case all needle sponge and instrument counts were correct. The patient was then awakened and taken to recovery in stable condition.  PLAN OF CARE: Discharge to home after PACU  PATIENT DISPOSITION:  PACU - hemodynamically stable.   Delay start of Pharmacological VTE agent (>24hrs) due to surgical blood loss or risk of bleeding: not applicable

## 2012-10-27 NOTE — H&P (View-Only) (Signed)
Subjective:     Patient ID: Monique Henderson, female   DOB: 07-Nov-1950, 62 y.o.   MRN: [id number]  HPI We are asked to see the pt in consultation by Dr. Owens Shark to evaluate her for right breast cancer. She recently went for a routine mammogram at which time some abnormal calcifications were found. 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 She noted some stinging sensation in her breast prior to this but no discharge from the nipple. She has been treated for left sided breast cancer in the past.  Review of Systems  Constitutional: Negative.   HENT: Negative.   Eyes: Negative.   Respiratory: Negative.   Cardiovascular: Negative.   Gastrointestinal: Negative.   Endocrine: Negative.   Genitourinary: Negative.   Musculoskeletal: Negative.   Skin: Negative.   Allergic/Immunologic: Negative.   Neurological: Negative.   Hematological: Negative.   Psychiatric/Behavioral: Negative.        Objective:   Physical Exam  Constitutional: She is oriented to person, place, and time. She appears well-developed and well-nourished.  HENT:  Head: Normocephalic and atraumatic.  Eyes: Conjunctivae and EOM are normal. Pupils are equal, round, and reactive to light.  Neck: Normal range of motion. Neck supple.  Cardiovascular: Normal rate, regular rhythm and normal heart sounds.   Pulmonary/Chest: Effort normal and breath sounds normal.  There is no palpable mass in either breast except for some bruising in the medial right breast. There is no palpable axillary, supraclavicular, or cervical lymphadenopathy  Abdominal: Soft. Bowel sounds are normal. She exhibits no mass. There is no tenderness.  Musculoskeletal: Normal range of motion.  Lymphadenopathy:    She has no cervical adenopathy.  Neurological: She is alert and oriented to person, place, and time.  Skin: Skin is warm and dry.  Psychiatric: She has a normal mood and affect. Her behavior is normal.        Assessment:     The pt has a small area of dcis with possible invasion in the inner right breast. I have discussed with her in detail the different treatment options and she favors breast conservation. I think this is a reasonable choice. I have discussed with her the risks and benefits of surgery as well as some of the technical aspects and she understands and wishes to proceed     Plan:     Plan for right breast wire localized lumpectomy and sentinel node mapping

## 2012-10-27 NOTE — Telephone Encounter (Signed)
She called stating she was having a lot of pain after her surgery today.  She had re-excision of positive margins of her right breast DCIS s/p lumpectomy.  I told her the pain after the second surgery is often greater than after the first.  She has been taking one pain pill at a time.  I instructed her to take two every 4 hours as needed, start taking Advil, and apply ice to the area.

## 2012-10-27 NOTE — Anesthesia Postprocedure Evaluation (Signed)
  Anesthesia Post-op Note  Patient: Monique Henderson  Procedure(s) Performed: Procedure(s): RE-EXCISION OF BREAST CANCER,SUPERIOR and inferior MARGINS (Right)  Patient Location: PACU  Anesthesia Type:General  Level of Consciousness: awake  Airway and Oxygen Therapy: Patient Spontanous Breathing  Post-op Pain: mild  Post-op Assessment: Post-op Vital signs reviewed  Post-op Vital Signs: stable  Complications: No apparent anesthesia complications

## 2012-10-27 NOTE — Telephone Encounter (Signed)
Spoke with patient and gave her new appointment for 6/16 at 1:15.

## 2012-10-27 NOTE — Preoperative (Signed)
Beta Blockers   Reason not to administer Beta Blockers:Not Applicable 

## 2012-10-27 NOTE — Transfer of Care (Signed)
Immediate Anesthesia Transfer of Care Note  Patient: Monique Henderson  Procedure(s) Performed: Procedure(s): RE-EXCISION OF BREAST CANCER,SUPERIOR and inferior MARGINS (Right)  Patient Location: PACU  Anesthesia Type:General  Level of Consciousness: awake, alert  and oriented  Airway & Oxygen Therapy: Patient Spontanous Breathing and Patient connected to nasal cannula oxygen  Post-op Assessment: Report given to PACU RN, Post -op Vital signs reviewed and stable and Patient moving all extremities X 4  Post vital signs: Reviewed and stable  Complications: No apparent anesthesia complications

## 2012-10-27 NOTE — Interval H&P Note (Signed)
History and Physical Interval Note:  10/27/2012 7:29 AM  Monique Henderson  has presented today for surgery, with the diagnosis of right DCIS  The various methods of treatment have been discussed with the patient and family. After consideration of risks, benefits and other options for treatment, the patient has consented to  Procedure(s): RE-EXCISION OF BREAST CANCER,SUPERIOR and inferior MARGINS (Right) as a surgical intervention .  The patient's history has been reviewed, patient examined, no change in status, stable for surgery.  I have reviewed the patient's chart and labs.  Questions were answered to the patient's satisfaction.   She had a right lumpectomy and had positive margins. She is here for re excision of superior and inferior margins  TOTH III,Tyronda Vizcarrondo S

## 2012-10-27 NOTE — Anesthesia Preprocedure Evaluation (Addendum)
Anesthesia Evaluation  Patient identified by MRN, date of birth, ID band Patient awake    Reviewed: Allergy & Precautions, H&P , NPO status   Airway Mallampati: I TM Distance: >3 FB Neck ROM: full    Dental  (+) Edentulous Upper, Poor Dentition and Dental Advidsory Given   Pulmonary  breath sounds clear to auscultation        Cardiovascular hypertension, Rhythm:Regular Rate:Normal     Neuro/Psych PSYCHIATRIC DISORDERS    GI/Hepatic GERD-  Medicated and Controlled,(+) Hepatitis -, C  Endo/Other    Renal/GU Renal disease     Musculoskeletal   Abdominal   Peds  Hematology  (+) anemia ,   Anesthesia Other Findings   Reproductive/Obstetrics                          Anesthesia Physical Anesthesia Plan  ASA: III  Anesthesia Plan: General   Post-op Pain Management:    Induction: Intravenous  Airway Management Planned: LMA  Additional Equipment:   Intra-op Plan:   Post-operative Plan: Extubation in OR  Informed Consent:   Dental Advisory Given  Plan Discussed with: Anesthesiologist, CRNA and Surgeon  Anesthesia Plan Comments:        Anesthesia Quick Evaluation

## 2012-10-27 NOTE — Telephone Encounter (Signed)
Rx called in 

## 2012-10-28 ENCOUNTER — Encounter (HOSPITAL_COMMUNITY): Payer: Self-pay | Admitting: General Surgery

## 2012-10-31 ENCOUNTER — Telehealth (INDEPENDENT_AMBULATORY_CARE_PROVIDER_SITE_OTHER): Payer: Self-pay

## 2012-10-31 ENCOUNTER — Telehealth (INDEPENDENT_AMBULATORY_CARE_PROVIDER_SITE_OTHER): Payer: Self-pay | Admitting: *Deleted

## 2012-10-31 NOTE — Telephone Encounter (Signed)
Patient has called again asking for a refill of her narcotic pain medication, Norco.  Again explained to patient that Dr. Marlou Starks is in surgery today and as soon as we hear we will call her back.  Patient seems anxious regarding getting this prescription.

## 2012-10-31 NOTE — Telephone Encounter (Signed)
Pt calling again for pain med refill.  Explained Dr. Marlou Starks is still in [place] and has not yet responded to her request.  Pt instructed not to keep calling, that we will call her as soon as Dr. Marlou Starks responds to her.

## 2012-10-31 NOTE — Telephone Encounter (Signed)
Patient calling into office requesting a refill on her Oxycodone 5 mg tablets.  Patient called in on 10/27/12 and spoke to Dr. Zella Richer in regards to the pain she was having.  Per Dr. Bertrum Sol note patient was advised to start taking advil and apply ice and to take 2 (two) of her Oxycodones (pain medication) every 4 hours as needed.  Patient last refill was 10/27/12 5 mg, #30.  Offered protocol refill (hydrocodone 5/325mg ) patient states that medication does not work for her.  Please advise on refill for her Oxycodone 5/325mg .

## 2012-10-31 NOTE — Telephone Encounter (Signed)
Patient has called back again asking about whether her refill has been approved or not yet.  Explained to patient that Dr. Marlou Starks is in surgery and we are still awaiting response at this time.

## 2012-10-31 NOTE — Telephone Encounter (Signed)
Error

## 2012-10-31 NOTE — Telephone Encounter (Signed)
Spoke to [place] MD who approved 1 refill of Norco 5/325mg  1 tablet every 6 hours as needed for pain #30 no refills.  [facility] ([place]) [phone]

## 2012-11-01 ENCOUNTER — Telehealth (INDEPENDENT_AMBULATORY_CARE_PROVIDER_SITE_OTHER): Payer: Self-pay

## 2012-11-01 NOTE — Telephone Encounter (Signed)
Message copied by Carlene Coria on Tue Nov 01, 2012  2:53 PM ------      Message from: Luella Cook III      Created: Fri Oct 28, 2012  4:10 PM       New margins are all clean ------

## 2012-11-01 NOTE — Telephone Encounter (Signed)
Called patient with path results with clear margins.

## 2012-11-01 NOTE — Addendum Note (Signed)
Addended by: Truddie Crumble on: 11/01/2012 11:28 AM   Modules accepted: Orders

## 2012-11-04 ENCOUNTER — Ambulatory Visit: Payer: Medicare Other | Admitting: Oncology

## 2012-11-07 ENCOUNTER — Telehealth (INDEPENDENT_AMBULATORY_CARE_PROVIDER_SITE_OTHER): Payer: Self-pay

## 2012-11-07 ENCOUNTER — Encounter (INDEPENDENT_AMBULATORY_CARE_PROVIDER_SITE_OTHER): Payer: Medicare Other | Admitting: General Surgery

## 2012-11-07 NOTE — Telephone Encounter (Signed)
Pt requested refill of her pain medication.  Norco 5/325mg  #30 called to [facility].  Pt is aware.

## 2012-11-08 ENCOUNTER — Telehealth (INDEPENDENT_AMBULATORY_CARE_PROVIDER_SITE_OTHER): Payer: Self-pay

## 2012-11-08 NOTE — Telephone Encounter (Signed)
LMOM. I have an an opening today 6/10 @ 11:00 to reschedule her appt. If she cant make it today I can squeeze her in at 2:15 tomorrow before Dr Marlou Starks starts urge. Please schedule her if she can make either one of these appts.

## 2012-11-09 ENCOUNTER — Other Ambulatory Visit: Payer: Self-pay | Admitting: Internal Medicine

## 2012-11-09 ENCOUNTER — Telehealth (INDEPENDENT_AMBULATORY_CARE_PROVIDER_SITE_OTHER): Payer: Self-pay

## 2012-11-09 NOTE — Telephone Encounter (Signed)
LMOM. She needs to reschedule her po appt. I have opening today @ 2:15 if she can make it. If not she will need to reschedule for the next time Dr Marlou Starks is in office.

## 2012-11-10 ENCOUNTER — Ambulatory Visit: Payer: Medicare Other

## 2012-11-10 ENCOUNTER — Ambulatory Visit: Payer: Medicare Other | Admitting: Radiation Oncology

## 2012-11-14 ENCOUNTER — Ambulatory Visit: Payer: Medicare Other | Admitting: Oncology

## 2012-11-15 ENCOUNTER — Other Ambulatory Visit: Payer: Self-pay | Admitting: *Deleted

## 2012-11-15 NOTE — Telephone Encounter (Signed)
Per [place] narc database, pt getting Hydrocodone and oxycodone this month from Dr Marlou Starks. Therefore defer tramadol refill to Dr Marinda Elk, PCP.

## 2012-11-15 NOTE — Telephone Encounter (Signed)
Last refill on tramadol 5/12 # 120 by Dr Marinda Elk

## 2012-11-16 MED ORDER — AMLODIPINE BESYLATE 10 MG PO TABS: 10.0000 mg | ORAL_TABLET | Freq: Every day | ORAL | Status: DC

## 2012-11-16 NOTE — Telephone Encounter (Signed)
Please find out what patient is currently taking for pain; is she taking tramadol, hydrocodone, and oxycodone?

## 2012-11-17 MED ORDER — LOSARTAN POTASSIUM 25 MG PO TABS: 25.0000 mg | ORAL_TABLET | Freq: Every day | ORAL | Status: DC

## 2012-11-17 MED ORDER — TRAMADOL HCL 50 MG PO TABS: 50.0000 mg | ORAL_TABLET | Freq: Four times a day (QID) | ORAL | Status: DC | PRN

## 2012-11-17 NOTE — Telephone Encounter (Signed)
Called pt and she states she is out of Tramadol and uses that for back and leg pain.   She had received a Rx for Percocet from Dr Marlou Starks, she did breast surgery on her 5/29, and she uses that at night time.

## 2012-11-17 NOTE — Telephone Encounter (Signed)
Rx called in 

## 2012-11-21 ENCOUNTER — Other Ambulatory Visit: Payer: Self-pay | Admitting: Internal Medicine

## 2012-11-21 NOTE — Telephone Encounter (Signed)
Refill approved - nurse to complete. 

## 2012-11-22 ENCOUNTER — Ambulatory Visit (INDEPENDENT_AMBULATORY_CARE_PROVIDER_SITE_OTHER): Payer: Medicare Other | Admitting: General Surgery

## 2012-11-22 ENCOUNTER — Encounter (INDEPENDENT_AMBULATORY_CARE_PROVIDER_SITE_OTHER): Payer: Self-pay | Admitting: General Surgery

## 2012-11-22 ENCOUNTER — Other Ambulatory Visit: Payer: Self-pay | Admitting: Internal Medicine

## 2012-11-22 VITALS — BP 144/84 | HR 64 | Temp 98.4°F | Resp 17 | Ht 64.0 in | Wt 132.0 lb

## 2012-11-22 DIAGNOSIS — C50311 Malignant neoplasm of lower-inner quadrant of right female breast: Secondary | ICD-10-CM

## 2012-11-22 DIAGNOSIS — C50319 Malignant neoplasm of lower-inner quadrant of unspecified female breast: Secondary | ICD-10-CM

## 2012-11-22 NOTE — Patient Instructions (Signed)
Will refer to medical and radiation oncology

## 2012-11-22 NOTE — Progress Notes (Signed)
Subjective:     Patient ID: Monique Henderson, female   DOB: 09-18-1950, 62 y.o.   MRN: [id number]  HPI The patient is a 62 year old black female who is about one month status post right lumpectomy for DCIS. She does have some pain and swelling in the right breast. She denies any fevers or chills. Her pathology showed that her margins were now clear. She was ER and PR negative.  Review of Systems     Objective:   Physical Exam On exam her right breast incision is healing nicely. She does have some swelling of the right breast consistent with seroma but no sign of infection    Assessment:     The patient is one month status post right lumpectomy for DCIS     Plan:     At this point we will refer her back to medical and radiation oncology to see if there is any further adjuvant therapy that needs to be done. She will continue to support the breast as needed. We will see her back in one month to check her progress

## 2012-11-22 NOTE — Telephone Encounter (Signed)
Called to pharm 

## 2012-11-23 NOTE — Telephone Encounter (Signed)
This was refilled and called in yesterday - please confirm.

## 2012-12-05 ENCOUNTER — Encounter: Payer: Self-pay | Admitting: Oncology

## 2012-12-05 NOTE — Progress Notes (Signed)
Location of Breast Cancer:right   Histology per Pathology Report: 1. Breast, excision, Right, new anterior and superior margins - BENIGN BREAST PARENCHYMA WITH FIBROSIS, CHRONIC INFLAMMATION, GIANT CELL REACTION AND FAT NECROSIS. - FIBROCYSTIC CHANGES PRESENT. - BENIGN SKIN WITH UNDERLYING CHRONIC INFLAMMATION AND GIANT CELL REACTION. - NO ATYPIA, HYPERPLASIA, OR MALIGNANCY IDENTIFIED. 2. Breast, excision, Right, new inferior margin - BENIGN BREAST PARENCHYMA WITH STROMAL REACTIVE CHANGES, FIBROSIS, CHRONIC INFLAMMATION AND FAT NECROSIS. - FIBROCYSTIC CHANGES WITH USUAL DUCTAL HYPERPLASIA. - NO ATYPIA OR MALIGNANCY IDENTIFIED.  Receptor Status: ER(negative), PR (negative), Her2-neu ()  Did patient present with symptoms (if so, please note symptoms) or was this found on screening mammography?:   Past/Anticipated interventions by surgeon, if any  Past/Anticipated interventions by medical oncology, if any: Chemotherapy   Lymphedema issues, if any:   Pain issues, if any:    SAFETY ISSUES:  Prior radiation? yes, left breast cGy 2005 by Dr. Tammi Klippel  Pacemaker/ICD? no  Possible current pregnancy?no  Is the patient on methotrexate? no  Current Complaints / other details:

## 2012-12-06 ENCOUNTER — Telehealth: Payer: Self-pay | Admitting: *Deleted

## 2012-12-06 ENCOUNTER — Encounter: Payer: Self-pay | Admitting: Radiation Oncology

## 2012-12-06 ENCOUNTER — Ambulatory Visit: Payer: Medicare Other

## 2012-12-06 ENCOUNTER — Ambulatory Visit
Admission: RE | Admit: 2012-12-06 | Discharge: 2012-12-06 | Disposition: A | Discharge status: Home / Self Care | Payer: Medicare Other | Source: Ambulatory Visit | Attending: Radiation Oncology | Admitting: Radiation Oncology

## 2012-12-06 DIAGNOSIS — Z923 Personal history of irradiation: Secondary | ICD-10-CM | POA: Insufficient documentation

## 2012-12-06 DIAGNOSIS — Z853 Personal history of malignant neoplasm of breast: Secondary | ICD-10-CM | POA: Insufficient documentation

## 2012-12-12 ENCOUNTER — Ambulatory Visit: Payer: Medicare Other

## 2012-12-12 ENCOUNTER — Telehealth (INDEPENDENT_AMBULATORY_CARE_PROVIDER_SITE_OTHER): Payer: Self-pay

## 2012-12-12 ENCOUNTER — Encounter: Payer: Self-pay | Admitting: Radiation Oncology

## 2012-12-12 ENCOUNTER — Institutional Professional Consult (permissible substitution): Payer: Medicare Other | Admitting: Radiation Oncology

## 2012-12-12 DIAGNOSIS — C50911 Malignant neoplasm of unspecified site of right female breast: Secondary | ICD-10-CM

## 2012-12-12 MED ORDER — HYDROCODONE-ACETAMINOPHEN 5-325 MG PO TABS: 1.0000 | ORAL_TABLET | Freq: Four times a day (QID) | ORAL | Status: DC | PRN

## 2012-12-12 NOTE — Progress Notes (Addendum)
Chart note: Monique Henderson missed her followup visit with me last week. She is now being scheduled towards the end of July. I explained her that I would like to get her right mammogram repeated to rule out residual microcalcifications. I would like to get this done sooner than later, she tells me that her breast is still quite uncomfortable and "swollen". Therefore, I will wait until she sees me later this month before scheduling a repeat mammogram. She will see Dr. Marlou Starks on July 22 and me on July 31.

## 2012-12-12 NOTE — Telephone Encounter (Signed)
Called pt to notify her that Dr Marlou Starks said we could call a rx for Hydrocodone 5/325mg  #30 phoned to [facility] on [address] [id number]. I place the rx order with [facility] per Dr Marlou Starks. The pt understands and has a f/u appt on 12/20/12.

## 2012-12-12 NOTE — Telephone Encounter (Signed)
Pt calling requesting refill on pain medicine. The pt has received Oxycodone a month a go and was requesting something for pain due to having some pain at the incision site still. I advised pt that I would have to page Dr Marlou Starks for the pain medicine but it would probably be for something not as strong like Hydrocodone. The pt just wants something till she see's Dr Marlou Starks next week. I will call her back.

## 2012-12-13 ENCOUNTER — Other Ambulatory Visit: Payer: Self-pay | Admitting: Internal Medicine

## 2012-12-20 ENCOUNTER — Ambulatory Visit (INDEPENDENT_AMBULATORY_CARE_PROVIDER_SITE_OTHER): Payer: Medicare Other | Admitting: General Surgery

## 2012-12-20 ENCOUNTER — Encounter (INDEPENDENT_AMBULATORY_CARE_PROVIDER_SITE_OTHER): Payer: Self-pay

## 2012-12-20 ENCOUNTER — Encounter (INDEPENDENT_AMBULATORY_CARE_PROVIDER_SITE_OTHER): Payer: Self-pay | Admitting: General Surgery

## 2012-12-20 VITALS — BP 162/84 | HR 68 | Temp 98.7°F | Resp 16 | Ht 64.0 in | Wt 131.6 lb

## 2012-12-20 DIAGNOSIS — D0510 Intraductal carcinoma in situ of unspecified breast: Secondary | ICD-10-CM | POA: Insufficient documentation

## 2012-12-20 DIAGNOSIS — D0511 Intraductal carcinoma in situ of right breast: Secondary | ICD-10-CM

## 2012-12-20 DIAGNOSIS — C50911 Malignant neoplasm of unspecified site of right female breast: Secondary | ICD-10-CM

## 2012-12-20 DIAGNOSIS — C50319 Malignant neoplasm of lower-inner quadrant of unspecified female breast: Secondary | ICD-10-CM

## 2012-12-20 DIAGNOSIS — C50312 Malignant neoplasm of lower-inner quadrant of left female breast: Secondary | ICD-10-CM

## 2012-12-20 DIAGNOSIS — D0590 Unspecified type of carcinoma in situ of unspecified breast: Secondary | ICD-10-CM

## 2012-12-20 HISTORY — DX: Intraductal carcinoma in situ of unspecified breast: D05.10

## 2012-12-20 MED ORDER — HYDROCODONE-ACETAMINOPHEN 5-325 MG PO TABS: 1.0000 | ORAL_TABLET | Freq: Four times a day (QID) | ORAL | Status: DC | PRN

## 2012-12-20 NOTE — Patient Instructions (Signed)
Follow up with medical and radiation oncology Warm compresses to right breast

## 2012-12-20 NOTE — Progress Notes (Signed)
Subjective:     Patient ID: Monique Henderson, female   DOB: 1951/05/12, 62 y.o.   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 She has had some persistent swelling of the right breast with some pain. She denies any fevers or chills. She denies any discharge from her nipple. She has met with the medical and radiation oncologists and is planning to start radiation therapy in about a week  Review of Systems     Objective:   Physical Exam On exam her right breast incision is healing nicely with no sign of infection. She does have some significant swelling beneath the incision. Her skin was prepped with ChloraPrep and infiltrated with 1% lidocaine. We attempted to aspirate the fluid in the cavity with an 18-gauge needle but all we got was a very small amount of dark blood.    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 She appears to have a hematoma at her operative site     Plan:     At this point I would recommend warm compresses to the right breast. She will continue to follow with medical and radiation oncology. We will plan to see her back in about 2 months to check her progress

## 2012-12-21 ENCOUNTER — Ambulatory Visit (INDEPENDENT_AMBULATORY_CARE_PROVIDER_SITE_OTHER): Payer: Medicare Other | Admitting: Internal Medicine

## 2012-12-21 ENCOUNTER — Encounter: Payer: Self-pay | Admitting: Internal Medicine

## 2012-12-21 VITALS — BP 140/80 | HR 60 | Temp 98.0°F | Ht 64.0 in | Wt 134.0 lb

## 2012-12-21 DIAGNOSIS — D0590 Unspecified type of carcinoma in situ of unspecified breast: Secondary | ICD-10-CM | POA: Diagnosis not present

## 2012-12-21 DIAGNOSIS — N259 Disorder resulting from impaired renal tubular function, unspecified: Secondary | ICD-10-CM | POA: Diagnosis not present

## 2012-12-21 DIAGNOSIS — K219 Gastro-esophageal reflux disease without esophagitis: Secondary | ICD-10-CM

## 2012-12-21 DIAGNOSIS — I10 Essential (primary) hypertension: Secondary | ICD-10-CM

## 2012-12-21 DIAGNOSIS — F17200 Nicotine dependence, unspecified, uncomplicated: Secondary | ICD-10-CM | POA: Diagnosis not present

## 2012-12-21 DIAGNOSIS — B1710 Acute hepatitis C without hepatic coma: Secondary | ICD-10-CM | POA: Diagnosis not present

## 2012-12-21 DIAGNOSIS — M545 Low back pain, unspecified: Secondary | ICD-10-CM | POA: Diagnosis not present

## 2012-12-21 DIAGNOSIS — D0511 Intraductal carcinoma in situ of right breast: Secondary | ICD-10-CM

## 2012-12-21 MED ORDER — LOSARTAN POTASSIUM 25 MG PO TABS: 25.0000 mg | ORAL_TABLET | Freq: Every day | ORAL | Status: DC

## 2012-12-21 MED ORDER — CLONIDINE HCL 0.1 MG PO TABS: ORAL_TABLET | ORAL | Status: DC

## 2012-12-21 NOTE — Assessment & Plan Note (Signed)
  Assessment: Progress toward smoking cessation:  smoking the same amount Barriers to progress toward smoking cessation:  lack of motivation to quit Comments: patient feels that she can stop completely  Plan: Instruction/counseling given:  I counseled patient on the dangers of tobacco use, advised patient to stop smoking, and reviewed strategies to maximize success. Medications to assist with smoking cessation:  None

## 2012-12-21 NOTE — Assessment & Plan Note (Signed)
Assessment: Patient's low back pain is chronic and stable.  She has not yet received a pain clinic appointment, although we made the referral.  Plan: Plan is to again refer to pain clinic; continue tramadol.

## 2012-12-21 NOTE — Assessment & Plan Note (Signed)
Assessment: Patient is doing well on famotidine 20 mg daily and omeprazole 40 mg daily.  Plan: Continue current regimen.

## 2012-12-21 NOTE — Assessment & Plan Note (Signed)
Assessment: Patient has not yet received an appointment for the hepatology clinic.  She would like to defer any treatment of her hepatitis C until she has completed breast cancer treatment, which is her primary issue at present.  Plan: The plan is to refer again to the hepatitis clinic for management of hepatitis C when she has completed treatment for breast cancer.

## 2012-12-21 NOTE — Patient Instructions (Signed)
General Instructions: Increase clonidine 0.1 mg tablets to a dose of 2 tablets each morning and one tablet each evening.    Progress Toward Treatment Goals:  Treatment Goal 12/21/2012  Blood pressure improved  Stop smoking smoking the same amount    Self Care Goals & Plans:  Self Care Goal 10/12/2012  Manage my medications take my medicines as prescribed; bring my medications to every visit  Monitor my health keep track of my blood pressure; bring my blood pressure log to each visit  Eat healthy foods drink diet soda or water instead of juice or soda; eat more vegetables; eat foods that are low in salt; eat baked foods instead of fried foods; eat smaller portions  Be physically active take a walk every day; find an activity I enjoy  Stop smoking set a quit date and stop smoking       Care Management & Community Referrals:  Referral 12/21/2012  Referrals made for care management support none needed  Referrals made to community resources none

## 2012-12-21 NOTE — Assessment & Plan Note (Signed)
Lab Results  Component Value Date   CREATININE 1.65* 10/27/2012   CREATININE 1.30* 10/13/2012   CREATININE 2.44* 10/12/2012   CREATININE 1.2* 09/28/2012   CREATININE 1.66* 04/20/2012   CREATININE 1.43* 02/25/2011     Assessment:  Patient has chronic renal insufficiency, with some fluctuations of her creatinine over the past few months.  Plan:  Will check a metabolic panel today.

## 2012-12-21 NOTE — Assessment & Plan Note (Signed)
BP Readings from Last 3 Encounters:  12/21/12 140/80  12/20/12 162/84  11/22/12 144/84    Lab Results  Component Value Date   NA 138 10/27/2012   K 3.5 10/27/2012   CREATININE 1.65* 10/27/2012    Assessment: Blood pressure control: mildly elevated Progress toward BP goal:  improved Comments: BP is mildly elevated on amlodipine 10 mg daily, losartan 25 mg daily, and clonidine 0.1 mg twice a day.  Plan: Medications:  increase clonidine to a dose of 0.2 mg each AM and 0.1 mg each PM. Other plans: check metabolic panel today

## 2012-12-21 NOTE — Progress Notes (Signed)
  Subjective:    Patient ID: Monique Henderson, female    DOB: [birth year], 62 y.o.   MRN: [id number]  HPI Patient returns for followup of her hypertension, renal insufficiency, chronic low back pain, and other medical problems.  She underwent lumpectomy in May by Dr. Marlou Starks for DCIS of the right breast, and followed up with Dr. Marlou Starks yesterday.  She is awaiting radiation therapy.  Her main complaint today is chronic low back pain with some radiation into her right buttock; she reports that the back pain has not significantly changed since her last visit.  She has been taking tramadol as needed for pain relief ; she also has recently been taking hydrocodone/acetaminophen prescribed for her postoperative pain by Dr. Marlou Starks.  She denies any lower extremity weakness or numbness, and also denies bowel or bladder problems.  She denies taking any recent ibuprofen or NSAID medications.  She reports that she is compliant with her antihypertensive medications.  She reports that she is still smoking on average 2-4 cigarettes per day.   Review of Systems  Constitutional: Negative for fever and chills.  Respiratory: Negative for shortness of breath.   Cardiovascular: Negative for chest pain and leg swelling.  Gastrointestinal: Negative for nausea, vomiting, abdominal pain and blood in stool.  Genitourinary: Negative for dysuria and difficulty urinating.  Neurological: Negative for dizziness, syncope, weakness and numbness.   I reviewed and reconciled medication list. I reviewed past medical, surgical, family, and social history.     Objective:   Physical Exam  Constitutional: She is oriented to person, place, and time. No distress.  Cardiovascular: Normal rate and regular rhythm.  Exam reveals no gallop and no friction rub.   No murmur heard. Pulmonary/Chest: Effort normal and breath sounds normal. No respiratory distress. She has no wheezes. She has no rales.  Abdominal: Soft. Bowel sounds are normal. She  exhibits no distension and no mass. There is no hepatosplenomegaly. There is no tenderness. There is no rebound and no guarding.  Musculoskeletal: She exhibits no edema.  Neurological: She is alert and oriented to person, place, and time.  Lower extremity strength is 5 over 5 bilaterally; straight leg raise is negative bilaterally.        Assessment & Plan:

## 2012-12-21 NOTE — Assessment & Plan Note (Signed)
Assessment: Patient is S/P breast lumpectomy 10/13/2012 by Dr. Autumn Messing and S/P re-excision of superior and inferior margins 10/27/2012.  Plan: Patient is awaiting radiation therapy planned by Dr. Arloa Koh.  She will follow up with Dr. Marlou Starks and Dr. Valere Dross as scheduled.

## 2012-12-22 LAB — COMPLETE METABOLIC PANEL WITH GFR
Albumin: 4.4 g/dL (ref 3.5–5.2)
Alkaline Phosphatase: 71 U/L (ref 39–117)
CO2: 30 mEq/L (ref 19–32)
GFR, Est African American: 48 mL/min — ABNORMAL LOW
GFR, Est Non African American: 42 mL/min — ABNORMAL LOW
Glucose, Bld: 91 mg/dL (ref 70–99)
Potassium: 4.3 mEq/L (ref 3.5–5.3)
Sodium: 141 mEq/L (ref 135–145)
Total Protein: 7.2 g/dL (ref 6.0–8.3)

## 2012-12-29 ENCOUNTER — Ambulatory Visit
Admission: RE | Admit: 2012-12-29 | Discharge: 2012-12-29 | Disposition: A | Discharge status: Home / Self Care | Payer: Medicare Other | Source: Ambulatory Visit | Attending: Radiation Oncology | Admitting: Radiation Oncology

## 2012-12-29 ENCOUNTER — Encounter: Payer: Self-pay | Admitting: Radiation Oncology

## 2012-12-29 VITALS — BP 129/88 | HR 61 | Temp 97.5°F | Resp 20 | Ht 64.0 in | Wt 135.1 lb

## 2012-12-29 DIAGNOSIS — D0590 Unspecified type of carcinoma in situ of unspecified breast: Secondary | ICD-10-CM | POA: Insufficient documentation

## 2012-12-29 DIAGNOSIS — D0511 Intraductal carcinoma in situ of right breast: Secondary | ICD-10-CM

## 2012-12-29 DIAGNOSIS — C50319 Malignant neoplasm of lower-inner quadrant of unspecified female breast: Secondary | ICD-10-CM | POA: Diagnosis not present

## 2012-12-29 NOTE — Progress Notes (Signed)
Location of Breast Cancer:Right  Diagnosis 10/27/12 1. Breast, excision, Right, new anterior and superior margins - BENIGN BREAST PARENCHYMA WITH FIBROSIS, CHRONIC INFLAMMATION, GIANT CELL REACTION AND FAT NECROSIS. - FIBROCYSTIC CHANGES PRESENT. - BENIGN SKIN WITH UNDERLYING CHRONIC INFLAMMATION AND GIANT CELL REACTION. - NO ATYPIA, HYPERPLASIA, OR MALIGNANCY IDENTIFIED. 2. Breast, excision, Right, new inferior margin - BENIGN BREAST PARENCHYMA WITH STROMAL REACTIVE CHANGES, FIBROSIS, CHRONIC INFLAMMATION AND FAT NECROSIS. - FIBROCYSTIC CHANGES WITH USUAL DUCTA Histology per Pathology Report:  Receptor Status: ER(-), PR (-), Her2-neu ()  Did patient present with symptoms (if so, please note symptoms) or was this found on screening mammography?: mammogram screening  Past/Anticipated interventions by surgeon, if [id number] 10/27/12, excision right breast,,  12/20/12  Aspirated small amt blood, from seroma Dr.Toth office,  02/21/13  Check up Dr.Toth   Past/Anticipated interventions by medical oncology, if any: Chemotherapy NO  Lymphedema issues, if any:  no  Pain issues, if any:  yes , right breast, tennis ball size knot/seroma , throbs,stings at times,tender  SAFETY ISSUES:  Prior radiation? Yes, left breast cGy 2005 Dr.Manning  Pacemaker/ICD? no,   Possible current pregnancy?no  Is the patient on methotrexate? no  Current Complaints / other details:  Hep C defers treatment till after rad tx    Rebecca Eaton, RN 12/29/2012,7:44 AM

## 2012-12-29 NOTE — Progress Notes (Signed)
CC: Dr. 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 Her past history is remarkable for having received left breast radiation approximately 9 years ago for DCIS treated by partial mastectomy.She did well with that. More recently, she was noted to have new pleomorphic calcifications within the right lower inner quadrant of the right breast at approximately 3 to 4:00. This cover the area of 7 x 9 x 12 mm, 10 cm from the nipple. Stereotactic biopsy on 09/21/2012 was diagnostic for DCIS with calcifications. The DCIS was felt to be high grade. The DCIS was hormone receptor negative. Breast MR was not performed because of a decreased GFR of 24. Dr. Marlou Starks performed a right partial mastectomy on 10/13/2012 revealing a 1.7 cm area of high-grade DCIS with calcifications. There was DCIS focally present at the superior resection margin and less than 0.1 cm from the inferior margin. Therefore, she underwent reexcision on 10/27/2012 with no evidence for residual DCIS. Postoperatively she developed a hematoma . She is now approximately 2 months out from her reexcision. I'll try to get her scheduled for a right breast mammogram to rule out residual microcalcifications a few weeks ago, but she stated that her breast was too "swollen". Of note is that she does have hepatitis C and will be proceeding with therapy for hepatitis C in the near future.  Physical examination: Alert and oriented. Wt Readings from Last 3 Encounters:  12/29/12 135 lb 1.6 oz (61.281 kg)  12/21/12 134 lb (60.782 kg)  12/20/12 131 lb 9.6 oz (59.693 kg)   Temp Readings from Last 3 Encounters:  12/29/12 97.5 F (36.4 C) Oral  12/21/12 98 F (36.7 C) Oral  12/20/12 98.7 F (37.1 C) Oral   BP Readings from Last 3 Encounters:   12/29/12 129/88  12/21/12 140/80  12/20/12 162/84   Pulse Readings from Last 3 Encounters:  12/29/12 61  12/21/12 60  12/20/12 68   Head and neck examination: Grossly unremarkable. Nodes: Without palpable cervical, supraclavicular, or axillary lymphadenopathy. Chest: Lungs clear. Back: Without palpable spinal or CVA tenderness. Breasts: There is a 10 x 10 cm hematoma along the medial aspect of the right breast. Her partial mastectomy wound at 3:00 is healing well. Left breast without masses or lesions. Abdomen without hepatomegaly. Extremities without edema.  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 She is a candidate for breast preservation. I was hoping to get a mammogram of the right breast to confirm removal of all suspicious microcalcifications. Based on the negative reexcision in the presence of a hematoma I think we can safely omit a right breast mammogram at this time. Even with her hematoma I think we can proceed with radiation therapy since she is now 2 months out from her initial surgery. I am leaning towards standard fractionation rather than hypofractionated radiation therapy based on her high-grade DCIS a large hematoma. We discussed the potential acute and late toxicities of radiation therapy. Consent is signed today. I will have her return for simulation/treatment planning early next week.  Plan: As discussed above  there  30 minutes was spent face-to-face with the patient, primarily counseling patient and coordinating her care.

## 2012-12-29 NOTE — Progress Notes (Signed)
Please see the Nurse Progress Note in the MD Initial Consult Encounter for this patient. 

## 2012-12-29 NOTE — Progress Notes (Signed)
Chart review: I reviewed Monique Henderson's left breast tangents and 2005, and her medial border was at least 3 cm from the midline. There should be no difficulty treating her right breast.

## 2013-01-02 ENCOUNTER — Ambulatory Visit: Payer: Medicare Other | Admitting: Radiation Oncology

## 2013-01-03 ENCOUNTER — Other Ambulatory Visit: Payer: Self-pay | Admitting: Internal Medicine

## 2013-01-03 ENCOUNTER — Telehealth (INDEPENDENT_AMBULATORY_CARE_PROVIDER_SITE_OTHER): Payer: Self-pay | Admitting: *Deleted

## 2013-01-03 ENCOUNTER — Ambulatory Visit
Admission: RE | Admit: 2013-01-03 | Discharge: 2013-01-03 | Disposition: A | Discharge status: Home / Self Care | Payer: Medicare Other | Source: Ambulatory Visit | Attending: Radiation Oncology | Admitting: Radiation Oncology

## 2013-01-03 DIAGNOSIS — C50319 Malignant neoplasm of lower-inner quadrant of unspecified female breast: Secondary | ICD-10-CM | POA: Diagnosis not present

## 2013-01-03 DIAGNOSIS — M5430 Sciatica, unspecified side: Secondary | ICD-10-CM | POA: Diagnosis not present

## 2013-01-03 DIAGNOSIS — C50919 Malignant neoplasm of unspecified site of unspecified female breast: Secondary | ICD-10-CM | POA: Diagnosis not present

## 2013-01-03 DIAGNOSIS — C50911 Malignant neoplasm of unspecified site of right female breast: Secondary | ICD-10-CM

## 2013-01-03 DIAGNOSIS — L589 Radiodermatitis, unspecified: Secondary | ICD-10-CM | POA: Insufficient documentation

## 2013-01-03 DIAGNOSIS — IMO0002 Reserved for concepts with insufficient information to code with codable children: Secondary | ICD-10-CM | POA: Insufficient documentation

## 2013-01-03 DIAGNOSIS — Y842 Radiological procedure and radiotherapy as the cause of abnormal reaction of the patient, or of later complication, without mention of misadventure at the time of the procedure: Secondary | ICD-10-CM | POA: Insufficient documentation

## 2013-01-03 DIAGNOSIS — Y838 Other surgical procedures as the cause of abnormal reaction of the patient, or of later complication, without mention of misadventure at the time of the procedure: Secondary | ICD-10-CM | POA: Insufficient documentation

## 2013-01-03 DIAGNOSIS — Z510 Encounter for antineoplastic radiation therapy: Secondary | ICD-10-CM | POA: Insufficient documentation

## 2013-01-03 NOTE — Telephone Encounter (Signed)
Patient called in to request a refill on her pain medication.  Explained to patient that due to her surgery being so long ago and she is not due to follow until September Dr. Marlou Starks would have to approve this refill.  Patient had a refill of Norco on 12/20/12 #40.  Patient states continued pain in the right breast.  Explained to patient that Dr. Marlou Starks will be back tomorrow and a message will be sent to him today for approval however due to him being gone today no decision will be made until tomorrow.  Patient states understanding and agreeable at this time.

## 2013-01-03 NOTE — Progress Notes (Signed)
Complex simulation/treatment planning note: The patient was taken to the CT simulator table. She was placed supine on a custom breast board. A custom neck mold was constructed for immobilization. It was noted that she had previous radiation therapy to her left breast and her previous fields were reviewed. There were no tattoos from her left breast irradiation. Her right breast was marked with radiopaque wires. She was then scanned. The CT data set was sent to the treatment planning system for contouring of her normal structures including her tumor bed/hematoma. She was set up to medial and lateral right breast tangents. 2 sets of multileaf collimators were designed to conform the field. I prescribing 5040 cGy in 28 sessions utilizing 6 MV photons. There will not be a boost.

## 2013-01-04 MED ORDER — HYDROCODONE-ACETAMINOPHEN 5-325 MG PO TABS: 1.0000 | ORAL_TABLET | Freq: Four times a day (QID) | ORAL | Status: DC | PRN

## 2013-01-04 NOTE — Telephone Encounter (Signed)
Pt called and told prescription is being called in

## 2013-01-04 NOTE — Telephone Encounter (Signed)
Refill approved - nurse to call in. 

## 2013-01-04 NOTE — Telephone Encounter (Signed)
I am ok with another refill of norco. It looks as if she had a hematoma after surgery.

## 2013-01-04 NOTE — Telephone Encounter (Signed)
Called to pharm 

## 2013-01-05 ENCOUNTER — Other Ambulatory Visit: Payer: Self-pay | Admitting: Internal Medicine

## 2013-01-05 DIAGNOSIS — IMO0002 Reserved for concepts with insufficient information to code with codable children: Secondary | ICD-10-CM | POA: Diagnosis not present

## 2013-01-05 DIAGNOSIS — M5430 Sciatica, unspecified side: Secondary | ICD-10-CM | POA: Diagnosis not present

## 2013-01-05 DIAGNOSIS — C50319 Malignant neoplasm of lower-inner quadrant of unspecified female breast: Secondary | ICD-10-CM | POA: Diagnosis not present

## 2013-01-05 DIAGNOSIS — L589 Radiodermatitis, unspecified: Secondary | ICD-10-CM | POA: Diagnosis not present

## 2013-01-05 DIAGNOSIS — C50919 Malignant neoplasm of unspecified site of unspecified female breast: Secondary | ICD-10-CM | POA: Diagnosis not present

## 2013-01-05 DIAGNOSIS — Z510 Encounter for antineoplastic radiation therapy: Secondary | ICD-10-CM | POA: Diagnosis not present

## 2013-01-05 NOTE — Telephone Encounter (Signed)
Refill approved - nurse to call in. 

## 2013-01-06 NOTE — Addendum Note (Signed)
Encounter addended by: Alvester Morin on: 01/06/2013 11:24 AM<BR>     Documentation filed: Charges VN

## 2013-01-06 NOTE — Telephone Encounter (Signed)
Rx called in 

## 2013-01-10 ENCOUNTER — Ambulatory Visit
Admission: RE | Admit: 2013-01-10 | Discharge: 2013-01-10 | Disposition: A | Discharge status: Home / Self Care | Payer: Medicare Other | Source: Ambulatory Visit | Attending: Radiation Oncology | Admitting: Radiation Oncology

## 2013-01-10 ENCOUNTER — Encounter: Payer: Self-pay | Admitting: Radiation Oncology

## 2013-01-10 DIAGNOSIS — IMO0002 Reserved for concepts with insufficient information to code with codable children: Secondary | ICD-10-CM | POA: Diagnosis not present

## 2013-01-10 DIAGNOSIS — M5430 Sciatica, unspecified side: Secondary | ICD-10-CM | POA: Diagnosis not present

## 2013-01-10 DIAGNOSIS — C50919 Malignant neoplasm of unspecified site of unspecified female breast: Secondary | ICD-10-CM | POA: Diagnosis not present

## 2013-01-10 DIAGNOSIS — C50319 Malignant neoplasm of lower-inner quadrant of unspecified female breast: Secondary | ICD-10-CM | POA: Diagnosis not present

## 2013-01-10 DIAGNOSIS — Z510 Encounter for antineoplastic radiation therapy: Secondary | ICD-10-CM | POA: Diagnosis not present

## 2013-01-10 DIAGNOSIS — L589 Radiodermatitis, unspecified: Secondary | ICD-10-CM | POA: Diagnosis not present

## 2013-01-10 NOTE — Progress Notes (Addendum)
Simulation Verification Note outpatient R breast  The patient was brought to the treatment unit and placed in the planned treatment position. The clinical setup was verified. Then port films were obtained and uploaded to the radiation oncology medical record software.  The treatment beams were carefully compared against the planned radiation fields. The position location and shape of the radiation fields was reviewed. They targeted volume of tissue appears to be appropriately covered by the radiation beams. Organs at risk appear to be excluded as planned.  Based on my personal review, I approved the simulation verification. The patient's treatment will proceed as planned.  -----------------------------------  Eppie Gibson, MD

## 2013-01-11 ENCOUNTER — Ambulatory Visit
Admission: RE | Admit: 2013-01-11 | Discharge: 2013-01-11 | Disposition: A | Discharge status: Home / Self Care | Payer: Medicare Other | Source: Ambulatory Visit | Attending: Radiation Oncology | Admitting: Radiation Oncology

## 2013-01-11 DIAGNOSIS — IMO0002 Reserved for concepts with insufficient information to code with codable children: Secondary | ICD-10-CM | POA: Diagnosis not present

## 2013-01-11 DIAGNOSIS — Z510 Encounter for antineoplastic radiation therapy: Secondary | ICD-10-CM | POA: Diagnosis not present

## 2013-01-11 DIAGNOSIS — L589 Radiodermatitis, unspecified: Secondary | ICD-10-CM | POA: Diagnosis not present

## 2013-01-11 DIAGNOSIS — M5430 Sciatica, unspecified side: Secondary | ICD-10-CM | POA: Diagnosis not present

## 2013-01-11 DIAGNOSIS — C50919 Malignant neoplasm of unspecified site of unspecified female breast: Secondary | ICD-10-CM | POA: Diagnosis not present

## 2013-01-11 DIAGNOSIS — C50319 Malignant neoplasm of lower-inner quadrant of unspecified female breast: Secondary | ICD-10-CM | POA: Diagnosis not present

## 2013-01-12 ENCOUNTER — Ambulatory Visit
Admission: RE | Admit: 2013-01-12 | Discharge: 2013-01-12 | Disposition: A | Discharge status: Home / Self Care | Payer: Medicare Other | Source: Ambulatory Visit | Attending: Radiation Oncology | Admitting: Radiation Oncology

## 2013-01-12 DIAGNOSIS — C50919 Malignant neoplasm of unspecified site of unspecified female breast: Secondary | ICD-10-CM | POA: Diagnosis not present

## 2013-01-12 DIAGNOSIS — IMO0002 Reserved for concepts with insufficient information to code with codable children: Secondary | ICD-10-CM | POA: Diagnosis not present

## 2013-01-12 DIAGNOSIS — M5430 Sciatica, unspecified side: Secondary | ICD-10-CM | POA: Diagnosis not present

## 2013-01-12 DIAGNOSIS — L589 Radiodermatitis, unspecified: Secondary | ICD-10-CM | POA: Diagnosis not present

## 2013-01-12 DIAGNOSIS — Z510 Encounter for antineoplastic radiation therapy: Secondary | ICD-10-CM | POA: Diagnosis not present

## 2013-01-13 ENCOUNTER — Ambulatory Visit
Admission: RE | Admit: 2013-01-13 | Discharge: 2013-01-13 | Disposition: A | Discharge status: Home / Self Care | Payer: Medicare Other | Source: Ambulatory Visit | Attending: Radiation Oncology | Admitting: Radiation Oncology

## 2013-01-13 DIAGNOSIS — C50919 Malignant neoplasm of unspecified site of unspecified female breast: Secondary | ICD-10-CM | POA: Diagnosis not present

## 2013-01-13 DIAGNOSIS — M5430 Sciatica, unspecified side: Secondary | ICD-10-CM | POA: Diagnosis not present

## 2013-01-13 DIAGNOSIS — L589 Radiodermatitis, unspecified: Secondary | ICD-10-CM | POA: Diagnosis not present

## 2013-01-13 DIAGNOSIS — IMO0002 Reserved for concepts with insufficient information to code with codable children: Secondary | ICD-10-CM | POA: Diagnosis not present

## 2013-01-13 DIAGNOSIS — Z510 Encounter for antineoplastic radiation therapy: Secondary | ICD-10-CM | POA: Diagnosis not present

## 2013-01-16 ENCOUNTER — Ambulatory Visit: Admission: RE | Admit: 2013-01-16 | Payer: Medicare Other | Source: Ambulatory Visit

## 2013-01-17 ENCOUNTER — Ambulatory Visit
Admission: RE | Admit: 2013-01-17 | Discharge: 2013-01-17 | Disposition: A | Discharge status: Home / Self Care | Payer: Medicare Other | Source: Ambulatory Visit | Attending: Radiation Oncology | Admitting: Radiation Oncology

## 2013-01-17 ENCOUNTER — Emergency Department (HOSPITAL_COMMUNITY)
Admission: EM | Admit: 2013-01-17 | Discharge: 2013-01-17 | Disposition: A | Discharge status: Home / Self Care | Payer: Medicare Other | Attending: Emergency Medicine | Admitting: Emergency Medicine

## 2013-01-17 ENCOUNTER — Encounter (HOSPITAL_COMMUNITY): Payer: Self-pay | Admitting: Emergency Medicine

## 2013-01-17 ENCOUNTER — Encounter: Payer: Self-pay | Admitting: Radiation Oncology

## 2013-01-17 VITALS — BP 116/83 | HR 51 | Temp 97.4°F | Resp 20 | Wt 137.3 lb

## 2013-01-17 DIAGNOSIS — Z853 Personal history of malignant neoplasm of breast: Secondary | ICD-10-CM | POA: Insufficient documentation

## 2013-01-17 DIAGNOSIS — G8929 Other chronic pain: Secondary | ICD-10-CM | POA: Insufficient documentation

## 2013-01-17 DIAGNOSIS — F17200 Nicotine dependence, unspecified, uncomplicated: Secondary | ICD-10-CM | POA: Insufficient documentation

## 2013-01-17 DIAGNOSIS — Z862 Personal history of diseases of the blood and blood-forming organs and certain disorders involving the immune mechanism: Secondary | ICD-10-CM | POA: Diagnosis not present

## 2013-01-17 DIAGNOSIS — L589 Radiodermatitis, unspecified: Secondary | ICD-10-CM | POA: Diagnosis not present

## 2013-01-17 DIAGNOSIS — M545 Low back pain, unspecified: Secondary | ICD-10-CM | POA: Diagnosis not present

## 2013-01-17 DIAGNOSIS — M5430 Sciatica, unspecified side: Secondary | ICD-10-CM | POA: Diagnosis not present

## 2013-01-17 DIAGNOSIS — Z923 Personal history of irradiation: Secondary | ICD-10-CM | POA: Diagnosis not present

## 2013-01-17 DIAGNOSIS — IMO0002 Reserved for concepts with insufficient information to code with codable children: Secondary | ICD-10-CM | POA: Insufficient documentation

## 2013-01-17 DIAGNOSIS — C50919 Malignant neoplasm of unspecified site of unspecified female breast: Secondary | ICD-10-CM | POA: Diagnosis not present

## 2013-01-17 DIAGNOSIS — M1990 Unspecified osteoarthritis, unspecified site: Secondary | ICD-10-CM | POA: Insufficient documentation

## 2013-01-17 DIAGNOSIS — Z87448 Personal history of other diseases of urinary system: Secondary | ICD-10-CM | POA: Diagnosis not present

## 2013-01-17 DIAGNOSIS — Z79899 Other long term (current) drug therapy: Secondary | ICD-10-CM | POA: Insufficient documentation

## 2013-01-17 DIAGNOSIS — M5416 Radiculopathy, lumbar region: Secondary | ICD-10-CM

## 2013-01-17 DIAGNOSIS — Z8739 Personal history of other diseases of the musculoskeletal system and connective tissue: Secondary | ICD-10-CM | POA: Insufficient documentation

## 2013-01-17 DIAGNOSIS — Z8619 Personal history of other infectious and parasitic diseases: Secondary | ICD-10-CM | POA: Diagnosis not present

## 2013-01-17 DIAGNOSIS — Z8742 Personal history of other diseases of the female genital tract: Secondary | ICD-10-CM | POA: Insufficient documentation

## 2013-01-17 DIAGNOSIS — K219 Gastro-esophageal reflux disease without esophagitis: Secondary | ICD-10-CM | POA: Diagnosis not present

## 2013-01-17 DIAGNOSIS — M549 Dorsalgia, unspecified: Secondary | ICD-10-CM

## 2013-01-17 DIAGNOSIS — I10 Essential (primary) hypertension: Secondary | ICD-10-CM | POA: Diagnosis not present

## 2013-01-17 DIAGNOSIS — C50911 Malignant neoplasm of unspecified site of right female breast: Secondary | ICD-10-CM

## 2013-01-17 DIAGNOSIS — Z510 Encounter for antineoplastic radiation therapy: Secondary | ICD-10-CM | POA: Diagnosis not present

## 2013-01-17 DIAGNOSIS — D0511 Intraductal carcinoma in situ of right breast: Secondary | ICD-10-CM

## 2013-01-17 MED ORDER — ALRA NON-METALLIC DEODORANT (RAD-ONC)
1.0000 "application " | Freq: Once | TOPICAL | Status: AC
  Administered 2013-01-17: 1 via TOPICAL

## 2013-01-17 MED ORDER — OXYCODONE-ACETAMINOPHEN 5-325 MG PO TABS: 1.0000 | ORAL_TABLET | Freq: Four times a day (QID) | ORAL | Status: DC | PRN

## 2013-01-17 MED ORDER — PREDNISONE 20 MG PO TABS: 40.0000 mg | ORAL_TABLET | Freq: Every day | ORAL | Status: DC

## 2013-01-17 MED ORDER — RADIAPLEXRX EX GEL
Freq: Once | CUTANEOUS | Status: AC
  Administered 2013-01-17: 13:00:00 via TOPICAL

## 2013-01-17 MED ORDER — KETOROLAC TROMETHAMINE 60 MG/2ML IM SOLN
60.0000 mg | Freq: Once | INTRAMUSCULAR | Status: AC
  Administered 2013-01-17: 60 mg via INTRAMUSCULAR
  Filled 2013-01-17: qty 2

## 2013-01-17 NOTE — ED Provider Notes (Signed)
CSN: [id number]     Arrival date & time 01/17/13  [id number] History     First MD Initiated Contact with Patient 01/17/13 1011     Chief Complaint  Patient presents with  . Hip Pain    left   (Consider location/radiation/quality/duration/timing/severity/associated sxs/prior Treatment) HPI Comments: Patient presents emergency department with chief complaint of low back pain. She states that she suffers from chronic low back pain, but that it was recently worsened over the past 4-5 days. She states that she has had surgery on her back in the past. She states that the pain radiates down her left leg. She is tried taking Tylenol and ibuprofen with some relief. She denies fevers, chills, bowel or bladder incontinence, or saddle anesthesia. She denies any falls or mechanism of injury.  The history is provided by the patient. No language interpreter was used.    Past Medical History  Diagnosis Date  . GERD (gastroesophageal reflux disease)   . Hepatitis C   . Hypertension   . Low back pain   . Osteoarthritis   . Uterine fibroid   . Normocytic anemia     With thrombocytosis  . Right ureteral stone 2002  . Atelectasis 2002    Bilateral  . Bone spur 2008    Right calcaneal foot spur  . Lumbar spinal stenosis     S/P lumbar decompressive laminectomy, fusion and plating for lumbar spinal stensosis  . Breast cancer 2004    Ductal carcinoma in situ of the left breast; S/P left partial mastectomy 02/26/2003; S/P re-excision of cranial and lateral margins11/18/2004.  Marland Kitchen Hot flashes   . Wears dentures     top  . Breast cancer 09/21/2012    right breast  . Hx of radiation therapy 2005    left breast  . 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 Autumn Messing; S/P re-excision of superior and inferior margins 10/27/2012.    Past Surgical History  Procedure Laterality Date  . Laminectomy  05/27/2009    Lumbar decompressive laminectomy, fusion and  plating for lumbar spinal stensosis  . Back surgery    . Breast lumpectomy  2011    lt-  . Mastectomy, partial  02/26/2003    S/P left partial mastectomy 02/26/2003; S/P re-excision of cranial and lateral margins11/18/2004.   . Breast lumpectomy with needle localization and axillary sentinel lymph node bx Right 10/13/2012    Procedure: BREAST LUMPECTOMY WITH NEEDLE LOCALIZATION;  Surgeon: [name], MD;  Location: [place];  Service: General;  Laterality: Right;  Right breast wire localized lumpectomy  . Re-excision of breast cancer,superior margins Right 10/27/2012    Procedure: RE-EXCISION OF BREAST CANCER,SUPERIOR and inferior MARGINS;  Surgeon: [name], MD;  Location: [facility];  Service: General;  Laterality: Right;   Family History  Problem Relation Age of Onset  . Colon cancer Mother 35  . Hypertension Mother   . Diabetes Sister 1  . Hypertension Sister   . Breast cancer Neg Hx   . Cervical cancer Neg Hx   . Diabetes Brother   . Hypertension Brother   . Diabetes Brother   . Hypertension Brother   . Kidney disease Son     On dialysis  . Hypertension Son   . Diabetes Son   . Multiple sclerosis Son   . Bone cancer Sister 76   History  Substance Use Topics  . Smoking  status: Current Some Day Smoker -- 0.20 packs/day for 32 years    Types: Cigarettes  . Smokeless tobacco: Never Used  . Alcohol Use: 0.0 oz/week    1-2 Cans of beer per week     Comment: Occasional    OB History   Grav Para Term Preterm Abortions TAB SAB Ect Mult Living   2 2 1 1            Review of Systems  All other systems reviewed and are negative.    Allergies  Shrimp; Lisinopril; and Vicodin  Home Medications   Current Outpatient Rx  Name  Route  Sig  Dispense  Refill  . albuterol (PROAIR HFA) 108 (90 BASE) MCG/ACT inhaler   Inhalation   Inhale 2 puffs into the lungs every 6 (six) hours as needed for wheezing.   1 Inhaler   1   . amLODipine (NORVASC) 10 MG  tablet   Oral   Take 10 mg by mouth daily.         Marland Kitchen aspirin 325 MG tablet   Oral   Take 1 tablet (325 mg total) by mouth daily.   100 tablet   3   . B Complex Vitamins (VITAMIN B COMPLEX PO)   Oral   Take 1 tablet by mouth daily.          . cetirizine (ZYRTEC) 10 MG tablet   Oral   Take 1 tablet (10 mg total) by mouth daily.   30 tablet   11   . cloNIDine (CATAPRES) 0.1 MG tablet   Oral   Take 0.1 mg by mouth 2 (two) times daily.         . famotidine (PEPCID) 20 MG tablet   Oral   Take 1 tablet (20 mg total) by mouth at bedtime.   30 tablet   11   . ibuprofen (ADVIL,MOTRIN) 200 MG tablet   Oral   Take 400 mg by mouth every 6 (six) hours as needed for pain.         Marland Kitchen losartan (COZAAR) 25 MG tablet   Oral   Take 1 tablet (25 mg total) by mouth daily.   90 tablet   3   . Multiple Vitamin (MULTIVITAMIN WITH MINERALS) TABS   Oral   Take 1 tablet by mouth daily.         Marland Kitchen omeprazole (PRILOSEC) 40 MG capsule   Oral   Take 1 capsule (40 mg total) by mouth daily.   30 capsule   11   . polyvinyl alcohol (LIQUIFILM TEARS) 1.4 % ophthalmic solution   Both Eyes   Place 1 drop into both eyes as needed.         . traMADol (ULTRAM) 50 MG tablet   Oral   Take 1 tablet (50 mg total) by mouth every 6 (six) hours as needed for pain.   90 tablet   0   . zolpidem (AMBIEN) 5 MG tablet   Oral   Take 5 mg by mouth at bedtime as needed for sleep.          BP 129/88  Pulse 55  Temp(Src) 97.6 F (36.4 C) (Oral)  Resp 20  SpO2 98% Physical Exam  Nursing note and vitals reviewed. Constitutional: She is oriented to person, place, and time. She appears well-developed and well-nourished. No distress.  HENT:  Head: Normocephalic and atraumatic.  Eyes: Conjunctivae and EOM are normal. Right eye exhibits no discharge. Left eye exhibits no  discharge. No scleral icterus.  Neck: Normal range of motion. Neck supple. No tracheal deviation present.  Cardiovascular:  Normal rate, regular rhythm and normal heart sounds.  Exam reveals no gallop and no friction rub.   No murmur heard. Pulmonary/Chest: Effort normal and breath sounds normal. No respiratory distress. She has no wheezes.  Abdominal: Soft. She exhibits no distension. There is no tenderness.  Musculoskeletal: Normal range of motion.  Left sided lumbar paraspinal muscles tender to palpation, no bony tenderness, step-offs, or gross abnormality or deformity of spine, patient is able to ambulate, moves all extremities  Neurological: She is alert and oriented to person, place, and time.  Sensation and strength intact bilaterally  Skin: Skin is warm. She is not diaphoretic.  Psychiatric: She has a normal mood and affect. Her behavior is normal. Judgment and thought content normal.    ED Course   Procedures (including critical care time)  Labs Reviewed - No data to display No results found. 1. Back pain   2. Lumbar radiculopathy     MDM  Patient with back pain.  No neurological deficits and normal neuro exam.  Patient can walk but states is painful.  No loss of bowel or bladder control.  No concern for cauda equina.  No fever, night sweats, weight loss, h/o cancer, IVDU.  RICE protocol and pain medicine indicated and discussed with patient.    Montine Circle, PA-C 01/17/13 1031

## 2013-01-17 NOTE — Addendum Note (Signed)
Encounter addended by: Andria Rhein, RN on: 01/17/2013  1:03 PM<BR>     Documentation filed: Inpatient MAR

## 2013-01-17 NOTE — Progress Notes (Addendum)
Post sim ed completed w/pt. Gave pt "Radiation and You" booklet w/all pertinent information marked and discussed, re : fatigue, skin irritation/care, nutrition, pain. Gave pt Radiaplex, ALra w/instructions for proper use. Pt verbalized understanding.  Pt c/o left hip pain "in my joint and it goes down the back of my leg". She states she has had this pain x 3-4 days; Ibuprofen, Ultram not helpful. She states he does not have any more Hydrocodone. Pt states "I'm going to the emergency room when I leave here." She states he has appointment w/her PCP 01/29/13, but states "I can't wait that long." Pt states she has hx of back surgery several years ago. Pt verbalized no other c/o.

## 2013-01-17 NOTE — Progress Notes (Signed)
Weekly Management Note:  Site: R. breast Current Dose:  720  cGy Projected Dose: 5040  CGy , no boost  Narrative: The patient is seen today for routine under treatment assessment. CBCT/MVCT images/port films were reviewed. The chart was reviewed.   She missed yesterday's treatment because of transportation difficulty. She visits today with worsening of her left sciatica. She cannot see her primary caretaker until August 31. Her pain is not relieved by Ultram or ibuprofen. She plans on visiting the ER.  Physical Examination:  Filed Vitals:   01/17/13 0933  BP: 116/83  Pulse: 51  Temp: 97.4 F (36.3 C)  Resp: 20  .  Weight: 137 lb 4.8 oz (62.279 kg). There has been slight regression of her right breast hematoma. No significant skin changes.  Impression: Tolerating radiation therapy well.  Plan: Continue radiation therapy as planned.

## 2013-01-17 NOTE — Addendum Note (Signed)
Encounter addended by: Andria Rhein, RN on: 01/17/2013  1:02 PM<BR>     Documentation filed: Orders

## 2013-01-17 NOTE — ED Notes (Signed)
Pt states that that her left hip has been bothering her x 4 days, denies injury.

## 2013-01-18 ENCOUNTER — Ambulatory Visit
Admission: RE | Admit: 2013-01-18 | Discharge: 2013-01-18 | Disposition: A | Discharge status: Home / Self Care | Payer: Medicare Other | Source: Ambulatory Visit | Attending: Radiation Oncology | Admitting: Radiation Oncology

## 2013-01-18 DIAGNOSIS — M5430 Sciatica, unspecified side: Secondary | ICD-10-CM | POA: Diagnosis not present

## 2013-01-18 DIAGNOSIS — Z510 Encounter for antineoplastic radiation therapy: Secondary | ICD-10-CM | POA: Diagnosis not present

## 2013-01-18 DIAGNOSIS — IMO0002 Reserved for concepts with insufficient information to code with codable children: Secondary | ICD-10-CM | POA: Diagnosis not present

## 2013-01-18 DIAGNOSIS — C50919 Malignant neoplasm of unspecified site of unspecified female breast: Secondary | ICD-10-CM | POA: Diagnosis not present

## 2013-01-18 DIAGNOSIS — L589 Radiodermatitis, unspecified: Secondary | ICD-10-CM | POA: Diagnosis not present

## 2013-01-18 NOTE — Telephone Encounter (Signed)
error 

## 2013-01-18 NOTE — ED Provider Notes (Signed)
Medical screening examination/treatment/procedure(s) were performed by non-physician practitioner and as supervising physician I was immediately available for consultation/collaboration.  Hoy Morn, MD 01/18/13 [phone]

## 2013-01-19 ENCOUNTER — Ambulatory Visit
Admission: RE | Admit: 2013-01-19 | Discharge: 2013-01-19 | Disposition: A | Discharge status: Home / Self Care | Payer: Medicare Other | Source: Ambulatory Visit | Attending: Radiation Oncology | Admitting: Radiation Oncology

## 2013-01-19 DIAGNOSIS — C50319 Malignant neoplasm of lower-inner quadrant of unspecified female breast: Secondary | ICD-10-CM | POA: Diagnosis not present

## 2013-01-19 DIAGNOSIS — Z510 Encounter for antineoplastic radiation therapy: Secondary | ICD-10-CM | POA: Diagnosis not present

## 2013-01-19 DIAGNOSIS — C50919 Malignant neoplasm of unspecified site of unspecified female breast: Secondary | ICD-10-CM | POA: Diagnosis not present

## 2013-01-19 DIAGNOSIS — M5430 Sciatica, unspecified side: Secondary | ICD-10-CM | POA: Diagnosis not present

## 2013-01-19 DIAGNOSIS — IMO0002 Reserved for concepts with insufficient information to code with codable children: Secondary | ICD-10-CM | POA: Diagnosis not present

## 2013-01-19 DIAGNOSIS — L589 Radiodermatitis, unspecified: Secondary | ICD-10-CM | POA: Diagnosis not present

## 2013-01-20 ENCOUNTER — Ambulatory Visit
Admission: RE | Admit: 2013-01-20 | Discharge: 2013-01-20 | Disposition: A | Discharge status: Home / Self Care | Payer: Medicare Other | Source: Ambulatory Visit | Attending: Radiation Oncology | Admitting: Radiation Oncology

## 2013-01-20 DIAGNOSIS — M5430 Sciatica, unspecified side: Secondary | ICD-10-CM | POA: Diagnosis not present

## 2013-01-20 DIAGNOSIS — Z510 Encounter for antineoplastic radiation therapy: Secondary | ICD-10-CM | POA: Diagnosis not present

## 2013-01-20 DIAGNOSIS — L589 Radiodermatitis, unspecified: Secondary | ICD-10-CM | POA: Diagnosis not present

## 2013-01-20 DIAGNOSIS — IMO0002 Reserved for concepts with insufficient information to code with codable children: Secondary | ICD-10-CM | POA: Diagnosis not present

## 2013-01-20 DIAGNOSIS — C50919 Malignant neoplasm of unspecified site of unspecified female breast: Secondary | ICD-10-CM | POA: Diagnosis not present

## 2013-01-23 ENCOUNTER — Ambulatory Visit
Admission: RE | Admit: 2013-01-23 | Discharge: 2013-01-23 | Disposition: A | Discharge status: Home / Self Care | Payer: Medicare Other | Source: Ambulatory Visit | Attending: Radiation Oncology | Admitting: Radiation Oncology

## 2013-01-23 ENCOUNTER — Encounter: Payer: Self-pay | Admitting: Radiation Oncology

## 2013-01-23 VITALS — BP 126/85 | HR 53 | Temp 97.4°F | Resp 20 | Wt 132.2 lb

## 2013-01-23 DIAGNOSIS — M5430 Sciatica, unspecified side: Secondary | ICD-10-CM | POA: Diagnosis not present

## 2013-01-23 DIAGNOSIS — Z510 Encounter for antineoplastic radiation therapy: Secondary | ICD-10-CM | POA: Diagnosis not present

## 2013-01-23 DIAGNOSIS — C50919 Malignant neoplasm of unspecified site of unspecified female breast: Secondary | ICD-10-CM | POA: Diagnosis not present

## 2013-01-23 DIAGNOSIS — IMO0002 Reserved for concepts with insufficient information to code with codable children: Secondary | ICD-10-CM | POA: Diagnosis not present

## 2013-01-23 DIAGNOSIS — C50911 Malignant neoplasm of unspecified site of right female breast: Secondary | ICD-10-CM

## 2013-01-23 DIAGNOSIS — L589 Radiodermatitis, unspecified: Secondary | ICD-10-CM | POA: Diagnosis not present

## 2013-01-23 NOTE — Progress Notes (Signed)
Weekly Management Note:  Site: Right breast Current Dose:  1440  cGy Projected Dose: 5040  cGy  Narrative: The patient is seen today for routine under treatment assessment. CBCT/MVCT images/port films were reviewed. The chart was reviewed.   No new complaints today. Her back pain is improved and she will see her primary care physician this week. She uses Radioplex gel.  Physical Examination:  Filed Vitals:   01/23/13 0910  BP: 126/85  Pulse: 53  Temp: 97.4 F (36.3 C)  Resp: 20  .  Weight: 132 lb 3.2 oz (59.966 kg). There is mild hyperpigmentation the skin with no areas of desquamation  Impression: Tolerating radiation therapy well.  Plan: Continue radiation therapy as planned.

## 2013-01-23 NOTE — Progress Notes (Addendum)
weekly rad tx,  8/28,  Right breast  Slight tanning, skin intact, [facility], received cortisone injection left hip, and percocet po pain,  Did help, has appt this Wed with Dr.jones,  Eating well, pain minimal hip, still concerned about lump in her breast"I just want it to go away" 9:14 AM

## 2013-01-24 ENCOUNTER — Ambulatory Visit
Admission: RE | Admit: 2013-01-24 | Discharge: 2013-01-24 | Disposition: A | Discharge status: Home / Self Care | Payer: Medicare Other | Source: Ambulatory Visit | Attending: Radiation Oncology | Admitting: Radiation Oncology

## 2013-01-24 DIAGNOSIS — Z510 Encounter for antineoplastic radiation therapy: Secondary | ICD-10-CM | POA: Diagnosis not present

## 2013-01-24 DIAGNOSIS — IMO0002 Reserved for concepts with insufficient information to code with codable children: Secondary | ICD-10-CM | POA: Diagnosis not present

## 2013-01-24 DIAGNOSIS — M5430 Sciatica, unspecified side: Secondary | ICD-10-CM | POA: Diagnosis not present

## 2013-01-24 DIAGNOSIS — L589 Radiodermatitis, unspecified: Secondary | ICD-10-CM | POA: Diagnosis not present

## 2013-01-24 DIAGNOSIS — C50919 Malignant neoplasm of unspecified site of unspecified female breast: Secondary | ICD-10-CM | POA: Diagnosis not present

## 2013-01-25 ENCOUNTER — Ambulatory Visit
Admission: RE | Admit: 2013-01-25 | Discharge: 2013-01-25 | Disposition: A | Discharge status: Home / Self Care | Payer: Medicare Other | Source: Ambulatory Visit | Attending: Radiation Oncology | Admitting: Radiation Oncology

## 2013-01-25 ENCOUNTER — Ambulatory Visit (INDEPENDENT_AMBULATORY_CARE_PROVIDER_SITE_OTHER): Payer: Medicare Other | Admitting: Internal Medicine

## 2013-01-25 ENCOUNTER — Telehealth: Payer: Self-pay | Admitting: *Deleted

## 2013-01-25 ENCOUNTER — Telehealth (INDEPENDENT_AMBULATORY_CARE_PROVIDER_SITE_OTHER): Payer: Self-pay | Admitting: *Deleted

## 2013-01-25 ENCOUNTER — Other Ambulatory Visit (INDEPENDENT_AMBULATORY_CARE_PROVIDER_SITE_OTHER): Payer: Self-pay | Admitting: *Deleted

## 2013-01-25 ENCOUNTER — Encounter: Payer: Self-pay | Admitting: Internal Medicine

## 2013-01-25 VITALS — BP 155/95 | HR 71 | Temp 97.5°F | Ht 64.0 in | Wt 134.8 lb

## 2013-01-25 DIAGNOSIS — Z510 Encounter for antineoplastic radiation therapy: Secondary | ICD-10-CM | POA: Diagnosis not present

## 2013-01-25 DIAGNOSIS — M545 Low back pain, unspecified: Secondary | ICD-10-CM | POA: Diagnosis not present

## 2013-01-25 DIAGNOSIS — Z23 Encounter for immunization: Secondary | ICD-10-CM

## 2013-01-25 DIAGNOSIS — C50919 Malignant neoplasm of unspecified site of unspecified female breast: Secondary | ICD-10-CM | POA: Diagnosis not present

## 2013-01-25 DIAGNOSIS — L589 Radiodermatitis, unspecified: Secondary | ICD-10-CM | POA: Diagnosis not present

## 2013-01-25 DIAGNOSIS — IMO0002 Reserved for concepts with insufficient information to code with codable children: Secondary | ICD-10-CM | POA: Diagnosis not present

## 2013-01-25 DIAGNOSIS — M5430 Sciatica, unspecified side: Secondary | ICD-10-CM | POA: Diagnosis not present

## 2013-01-25 MED ORDER — HYDROCODONE-ACETAMINOPHEN 5-325 MG PO TABS: 1.0000 | ORAL_TABLET | Freq: Four times a day (QID) | ORAL | Status: DC | PRN

## 2013-01-25 NOTE — Telephone Encounter (Signed)
Pharmacy calls for a refill of tramadol for pt, stating that according to directions pt is not receiving a 30 day supply it is only a 23 day supply, pt wants a refill, the pharmacy states if you intend for the script to last 30 days to please include that statement in the script from now on

## 2013-01-25 NOTE — Telephone Encounter (Signed)
She had a postop hematoma. She can have a refill of norco

## 2013-01-25 NOTE — Telephone Encounter (Signed)
Updated patient that the request for a refill of Norco was approved by Dr. Marlou Starks.  Called into [facility] [id number] Norco 5/325mg  1 tablet every 4-6 hours as needed for pain #30 no refills

## 2013-01-25 NOTE — Patient Instructions (Addendum)
-  A prescription for Vicodin has been sent to [facility] by Dr. Marlou Starks, your surgeon.  -We will call you in regards to the Pain Clinic Referral.  -Follow up with Dr. Marinda Elk in 1-2 months for routine follow up.

## 2013-01-25 NOTE — Telephone Encounter (Signed)
Patient called in this morning to report continued pain in her right breast that are "severe".  Patient states she doesn't come back to see Dr. Marlou Starks until the send of September.  Patient states this is the same pain she has been having since her lumpectomy in the right breast 10/27/12.  Patient asking for a refill of the Norco.  Explained that a message will be sent to Dr. Marlou Starks to ask for his approval.  Patient states understanding at this time and agreeable.

## 2013-01-26 ENCOUNTER — Ambulatory Visit
Admission: RE | Admit: 2013-01-26 | Discharge: 2013-01-26 | Disposition: A | Discharge status: Home / Self Care | Payer: Medicare Other | Source: Ambulatory Visit | Attending: Radiation Oncology | Admitting: Radiation Oncology

## 2013-01-26 DIAGNOSIS — L589 Radiodermatitis, unspecified: Secondary | ICD-10-CM | POA: Diagnosis not present

## 2013-01-26 DIAGNOSIS — C50919 Malignant neoplasm of unspecified site of unspecified female breast: Secondary | ICD-10-CM | POA: Diagnosis not present

## 2013-01-26 DIAGNOSIS — C50319 Malignant neoplasm of lower-inner quadrant of unspecified female breast: Secondary | ICD-10-CM | POA: Diagnosis not present

## 2013-01-26 DIAGNOSIS — Z510 Encounter for antineoplastic radiation therapy: Secondary | ICD-10-CM | POA: Diagnosis not present

## 2013-01-26 DIAGNOSIS — IMO0002 Reserved for concepts with insufficient information to code with codable children: Secondary | ICD-10-CM | POA: Diagnosis not present

## 2013-01-26 DIAGNOSIS — Z23 Encounter for immunization: Secondary | ICD-10-CM | POA: Insufficient documentation

## 2013-01-26 DIAGNOSIS — M5430 Sciatica, unspecified side: Secondary | ICD-10-CM | POA: Diagnosis not present

## 2013-01-26 MED ORDER — TRAMADOL HCL 50 MG PO TABS: 50.0000 mg | ORAL_TABLET | Freq: Four times a day (QID) | ORAL | Status: DC | PRN

## 2013-01-26 NOTE — Assessment & Plan Note (Signed)
She declines influenza , pneumococcal, and Tdap vaccination.

## 2013-01-26 NOTE — Assessment & Plan Note (Addendum)
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 Pain similar as before with no ref flags of paresthesia, bowel/bladder incontinence and negative straight leg raise. No indication for MRI at this time. Pt states that she has seen Dr. Percell Miller before for this pain, had steroid injections into her left hip with minimum relief. She is not interested in physical therapy or Sports Medicine evaluation at this time. Pt states that she has been referred to the Pain Clinic but could not be seen there until records from her previous Pain Clinic ([facility]) could be faxed to them. She states that Tramadol has not worked well for her.   Dr. Marlou Starks has called in Rx for Norco5-325mg  #30 for her pain and pt is instructed to pick this Rx from [facility].  Will follow up with Pain Clinic's request for additional records.

## 2013-01-26 NOTE — Telephone Encounter (Signed)
Called to pharm 

## 2013-01-26 NOTE — Telephone Encounter (Signed)
Refill approved - nurse to complete.  I approved #[id number].

## 2013-01-26 NOTE — Progress Notes (Signed)
  Subjective:    Patient ID: Monique Henderson, female    DOB: 1951-01-12, 62 y.o.   MRN: [id number]  HPI Ms. Denzler is a 62 year old woman with PMH of HTN, chronic low back pain, and DCIS s/p resection with ongoing radiation who presents for evaluation of her low back pain. She states that she has had left low back pain that radiates down to her posterior left leg. The pain is similar to the pain she has had before but is persistent and she has run out of her Percocet. She denies bowel or bladder incontinence, paresthesia, or fall. Of note, she has contacted Dr. Marlou Starks, her surgeon in regards to this pain and he hascalled in prescription for Norco 5-325mg  #30 just prior to her visit at the [facility].    Review of Systems  Constitutional: Negative for fever, chills, diaphoresis and appetite change.  Respiratory: Negative for cough and shortness of breath.   Cardiovascular: Negative for chest pain, palpitations and leg swelling.  Gastrointestinal: Negative for abdominal pain and constipation.  Genitourinary: Negative for dysuria.  Musculoskeletal: Positive for back pain. Negative for gait problem.  Skin: Negative for color change, pallor, rash and wound.  Neurological: Negative for dizziness, syncope, weakness, light-headedness, numbness and headaches.  Hematological: Negative for adenopathy.  Psychiatric/Behavioral: Negative for behavioral problems, confusion and agitation.       Objective:   Physical Exam  Nursing note and vitals reviewed. Constitutional: She is oriented to person, place, and time. She appears well-developed and well-nourished. She appears distressed.  Mild distress secondary to pain.   Eyes: Conjunctivae are normal. No scleral icterus.  Cardiovascular: Normal rate and regular rhythm.   Pulmonary/Chest: Effort normal and breath sounds normal. No respiratory distress. She has no wheezes. She has no rales.  Abdominal: Soft.  Musculoskeletal: Normal range of motion. She exhibits  tenderness. She exhibits no edema.  Paraspinal tenderness of lumbar spine. Negative straight leg raise bilaterally.  LE with strength 5/5 i LE bilaterally.   Neurological: She is alert and oriented to person, place, and time. Coordination normal.  Intact sensation bilaterally in her LE.  Skin: Skin is warm and dry. No rash noted. She is not diaphoretic. No erythema. No pallor.  Psychiatric: She has a normal mood and affect.          Assessment & Plan:

## 2013-01-27 ENCOUNTER — Ambulatory Visit: Payer: Medicare Other

## 2013-01-31 ENCOUNTER — Ambulatory Visit
Admission: RE | Admit: 2013-01-31 | Discharge: 2013-01-31 | Disposition: A | Discharge status: Home / Self Care | Payer: Medicare Other | Source: Ambulatory Visit | Attending: Radiation Oncology | Admitting: Radiation Oncology

## 2013-01-31 VITALS — BP 123/84 | HR 65 | Temp 97.6°F | Ht 64.0 in | Wt 132.8 lb

## 2013-01-31 DIAGNOSIS — C50919 Malignant neoplasm of unspecified site of unspecified female breast: Secondary | ICD-10-CM | POA: Diagnosis not present

## 2013-01-31 DIAGNOSIS — IMO0002 Reserved for concepts with insufficient information to code with codable children: Secondary | ICD-10-CM | POA: Diagnosis not present

## 2013-01-31 DIAGNOSIS — C50911 Malignant neoplasm of unspecified site of right female breast: Secondary | ICD-10-CM

## 2013-01-31 DIAGNOSIS — M5430 Sciatica, unspecified side: Secondary | ICD-10-CM | POA: Diagnosis not present

## 2013-01-31 DIAGNOSIS — Z510 Encounter for antineoplastic radiation therapy: Secondary | ICD-10-CM | POA: Diagnosis not present

## 2013-01-31 DIAGNOSIS — L589 Radiodermatitis, unspecified: Secondary | ICD-10-CM | POA: Diagnosis not present

## 2013-01-31 NOTE — Progress Notes (Signed)
Case discussed with Dr. Kennerly at the time of the visit.  We reviewed the resident's history and exam and pertinent patient test results.  I agree with the assessment, diagnosis, and plan of care documented in the resident's note. 

## 2013-01-31 NOTE — Progress Notes (Signed)
Monique Henderson here for weekly under treat visit.  She has had 12 fractions to her right breast.  She does have pain that she describes as throbing in her right breast.  She rates it at a 6/10.  She denies fatigue.  The skin on her right breast is intact.  She is using radiaplex gel twice a day.

## 2013-01-31 NOTE — Progress Notes (Signed)
Weekly Management Note:  Site: Right breast Current Dose:  2160  cGy Projected Dose: 5040  cGy  Narrative: The patient is seen today for routine under treatment assessment. CBCT/MVCT images/port films were reviewed. The chart was reviewed.   She is without new complaints today. She has occasional throbbing pain within her right breast. She uses Radioplex gel.  Physical Examination:  Filed Vitals:   01/31/13 0938  BP: 123/84  Pulse: 65  Temp: 97.6 F (36.4 C)  .  Weight: 132 lb 12.8 oz (60.238 kg). There is mild hyperpigmentation the skin along the right breast with no areas of desquamation. Her medial right breast hematoma is essentially unchanged.  Impression: Tolerating radiation therapy well.  Plan: Continue radiation therapy as planned.

## 2013-02-01 ENCOUNTER — Ambulatory Visit
Admission: RE | Admit: 2013-02-01 | Discharge: 2013-02-01 | Disposition: A | Discharge status: Home / Self Care | Payer: Medicare Other | Source: Ambulatory Visit | Attending: Radiation Oncology | Admitting: Radiation Oncology

## 2013-02-01 DIAGNOSIS — L589 Radiodermatitis, unspecified: Secondary | ICD-10-CM | POA: Diagnosis not present

## 2013-02-01 DIAGNOSIS — M5430 Sciatica, unspecified side: Secondary | ICD-10-CM | POA: Diagnosis not present

## 2013-02-01 DIAGNOSIS — IMO0002 Reserved for concepts with insufficient information to code with codable children: Secondary | ICD-10-CM | POA: Diagnosis not present

## 2013-02-01 DIAGNOSIS — C50919 Malignant neoplasm of unspecified site of unspecified female breast: Secondary | ICD-10-CM | POA: Diagnosis not present

## 2013-02-01 DIAGNOSIS — Z510 Encounter for antineoplastic radiation therapy: Secondary | ICD-10-CM | POA: Diagnosis not present

## 2013-02-02 ENCOUNTER — Ambulatory Visit: Payer: Medicare Other

## 2013-02-03 ENCOUNTER — Ambulatory Visit: Payer: Medicare Other

## 2013-02-03 ENCOUNTER — Ambulatory Visit
Admission: RE | Admit: 2013-02-03 | Discharge: 2013-02-03 | Disposition: A | Discharge status: Home / Self Care | Payer: Medicare Other | Source: Ambulatory Visit | Attending: Radiation Oncology | Admitting: Radiation Oncology

## 2013-02-03 DIAGNOSIS — C50919 Malignant neoplasm of unspecified site of unspecified female breast: Secondary | ICD-10-CM | POA: Diagnosis not present

## 2013-02-03 DIAGNOSIS — M5430 Sciatica, unspecified side: Secondary | ICD-10-CM | POA: Diagnosis not present

## 2013-02-03 DIAGNOSIS — L589 Radiodermatitis, unspecified: Secondary | ICD-10-CM | POA: Diagnosis not present

## 2013-02-03 DIAGNOSIS — IMO0002 Reserved for concepts with insufficient information to code with codable children: Secondary | ICD-10-CM | POA: Diagnosis not present

## 2013-02-03 DIAGNOSIS — Z510 Encounter for antineoplastic radiation therapy: Secondary | ICD-10-CM | POA: Diagnosis not present

## 2013-02-06 ENCOUNTER — Ambulatory Visit
Admission: RE | Admit: 2013-02-06 | Discharge: 2013-02-06 | Disposition: A | Discharge status: Home / Self Care | Payer: Medicare Other | Source: Ambulatory Visit | Attending: Radiation Oncology | Admitting: Radiation Oncology

## 2013-02-06 ENCOUNTER — Other Ambulatory Visit (INDEPENDENT_AMBULATORY_CARE_PROVIDER_SITE_OTHER): Payer: Self-pay

## 2013-02-06 ENCOUNTER — Ambulatory Visit: Payer: Medicare Other

## 2013-02-06 ENCOUNTER — Telehealth (INDEPENDENT_AMBULATORY_CARE_PROVIDER_SITE_OTHER): Payer: Self-pay

## 2013-02-06 ENCOUNTER — Encounter: Payer: Self-pay | Admitting: Radiation Oncology

## 2013-02-06 VITALS — BP 120/83 | HR 57 | Temp 97.8°F | Resp 20 | Wt 131.8 lb

## 2013-02-06 DIAGNOSIS — C50919 Malignant neoplasm of unspecified site of unspecified female breast: Secondary | ICD-10-CM | POA: Diagnosis not present

## 2013-02-06 DIAGNOSIS — M5430 Sciatica, unspecified side: Secondary | ICD-10-CM | POA: Diagnosis not present

## 2013-02-06 DIAGNOSIS — Z510 Encounter for antineoplastic radiation therapy: Secondary | ICD-10-CM | POA: Diagnosis not present

## 2013-02-06 DIAGNOSIS — G8918 Other acute postprocedural pain: Secondary | ICD-10-CM

## 2013-02-06 DIAGNOSIS — IMO0002 Reserved for concepts with insufficient information to code with codable children: Secondary | ICD-10-CM | POA: Diagnosis not present

## 2013-02-06 DIAGNOSIS — C50911 Malignant neoplasm of unspecified site of right female breast: Secondary | ICD-10-CM

## 2013-02-06 DIAGNOSIS — L589 Radiodermatitis, unspecified: Secondary | ICD-10-CM | POA: Diagnosis not present

## 2013-02-06 MED ORDER — HYDROCODONE-ACETAMINOPHEN 5-325 MG PO TABS: 1.0000 | ORAL_TABLET | Freq: Four times a day (QID) | ORAL | Status: DC | PRN

## 2013-02-06 NOTE — Telephone Encounter (Signed)
Pt called requesting refill on her Norco. Last refill 01-25-13. Noted in epic pt had Tramadol #120 called in by Dr Hayes Ludwig for pts hip pain on 01-25-13. I advised pt I will need to send this request to Dr. Marlou Starks for review. Pt uses [facility] [address]. Pt can be reached at [phone].

## 2013-02-06 NOTE — Telephone Encounter (Signed)
Sharyn Lull Dr Ethlyn Gallery assistant aware of call and will have Dr Marlou Starks review.

## 2013-02-06 NOTE — Telephone Encounter (Signed)
She can have one refill on her norco then she will have to see me

## 2013-02-06 NOTE — Progress Notes (Signed)
Pt reports tenderness of right breast, denies loss of appetite, fatigue. Pt applying Radiaplex to right breast treatment area; skin beginning to darken.

## 2013-02-06 NOTE — Telephone Encounter (Signed)
norco 5/325 # 30 refill called to [facility] per Dr Ethlyn Gallery request. Pt advised last refill until ov.

## 2013-02-06 NOTE — Telephone Encounter (Signed)
She can have one refill on her norco then she will have to see me back

## 2013-02-06 NOTE — Progress Notes (Signed)
   Weekly Management Note:  Outpatient Current Dose:  27 Gy  Projected Dose: 50.4 Gy  initial  Narrative:  The patient presents for routine under treatment assessment.  CBCT/MVCT images/Port film x-rays were reviewed.  The chart was checked. She reports tenderness of right breast, denies loss of appetite, fatigue. Pt applying Radiaplex to right breast treatment area; skin beginning to darken  Physical Findings:  weight is 131 lb 12.8 oz (59.784 kg). Her oral temperature is 97.8 F (36.6 C). Her blood pressure is 120/83 and her pulse is 57. Her respiration is 20.  Moderated hyperpigmentation, right breast. Skin intact  Impression:  The patient is tolerating radiotherapy.  Plan:  Continue radiotherapy as planned.   ________________________________   Eppie Gibson, M.D.

## 2013-02-07 ENCOUNTER — Ambulatory Visit: Payer: Medicare Other

## 2013-02-07 ENCOUNTER — Ambulatory Visit
Admission: RE | Admit: 2013-02-07 | Discharge: 2013-02-07 | Disposition: A | Discharge status: Home / Self Care | Payer: Medicare Other | Source: Ambulatory Visit | Attending: Radiation Oncology | Admitting: Radiation Oncology

## 2013-02-07 DIAGNOSIS — C50919 Malignant neoplasm of unspecified site of unspecified female breast: Secondary | ICD-10-CM | POA: Diagnosis not present

## 2013-02-07 DIAGNOSIS — Z510 Encounter for antineoplastic radiation therapy: Secondary | ICD-10-CM | POA: Diagnosis not present

## 2013-02-07 DIAGNOSIS — IMO0002 Reserved for concepts with insufficient information to code with codable children: Secondary | ICD-10-CM | POA: Diagnosis not present

## 2013-02-07 DIAGNOSIS — C50319 Malignant neoplasm of lower-inner quadrant of unspecified female breast: Secondary | ICD-10-CM | POA: Diagnosis not present

## 2013-02-07 DIAGNOSIS — M5430 Sciatica, unspecified side: Secondary | ICD-10-CM | POA: Diagnosis not present

## 2013-02-07 DIAGNOSIS — L589 Radiodermatitis, unspecified: Secondary | ICD-10-CM | POA: Diagnosis not present

## 2013-02-08 ENCOUNTER — Ambulatory Visit: Payer: Medicare Other

## 2013-02-09 ENCOUNTER — Ambulatory Visit: Payer: Medicare Other

## 2013-02-09 ENCOUNTER — Ambulatory Visit
Admission: RE | Admit: 2013-02-09 | Discharge: 2013-02-09 | Disposition: A | Discharge status: Home / Self Care | Payer: Medicare Other | Source: Ambulatory Visit | Attending: Radiation Oncology | Admitting: Radiation Oncology

## 2013-02-09 DIAGNOSIS — IMO0002 Reserved for concepts with insufficient information to code with codable children: Secondary | ICD-10-CM | POA: Diagnosis not present

## 2013-02-09 DIAGNOSIS — Z510 Encounter for antineoplastic radiation therapy: Secondary | ICD-10-CM | POA: Diagnosis not present

## 2013-02-09 DIAGNOSIS — M5430 Sciatica, unspecified side: Secondary | ICD-10-CM | POA: Diagnosis not present

## 2013-02-09 DIAGNOSIS — L589 Radiodermatitis, unspecified: Secondary | ICD-10-CM | POA: Diagnosis not present

## 2013-02-09 DIAGNOSIS — C50919 Malignant neoplasm of unspecified site of unspecified female breast: Secondary | ICD-10-CM | POA: Diagnosis not present

## 2013-02-10 ENCOUNTER — Ambulatory Visit
Admission: RE | Admit: 2013-02-10 | Discharge: 2013-02-10 | Disposition: A | Discharge status: Home / Self Care | Payer: Medicare Other | Source: Ambulatory Visit | Attending: Radiation Oncology | Admitting: Radiation Oncology

## 2013-02-10 ENCOUNTER — Ambulatory Visit: Payer: Medicare Other

## 2013-02-10 DIAGNOSIS — C50919 Malignant neoplasm of unspecified site of unspecified female breast: Secondary | ICD-10-CM | POA: Diagnosis not present

## 2013-02-10 DIAGNOSIS — IMO0002 Reserved for concepts with insufficient information to code with codable children: Secondary | ICD-10-CM | POA: Diagnosis not present

## 2013-02-10 DIAGNOSIS — L589 Radiodermatitis, unspecified: Secondary | ICD-10-CM | POA: Diagnosis not present

## 2013-02-10 DIAGNOSIS — Z510 Encounter for antineoplastic radiation therapy: Secondary | ICD-10-CM | POA: Diagnosis not present

## 2013-02-10 DIAGNOSIS — M5430 Sciatica, unspecified side: Secondary | ICD-10-CM | POA: Diagnosis not present

## 2013-02-13 ENCOUNTER — Ambulatory Visit
Admission: RE | Admit: 2013-02-13 | Discharge: 2013-02-13 | Disposition: A | Discharge status: Home / Self Care | Payer: Medicare Other | Source: Ambulatory Visit | Attending: Radiation Oncology | Admitting: Radiation Oncology

## 2013-02-13 ENCOUNTER — Encounter: Payer: Self-pay | Admitting: Radiation Oncology

## 2013-02-13 ENCOUNTER — Ambulatory Visit: Payer: Medicare Other

## 2013-02-13 VITALS — BP 126/83 | HR 59 | Temp 97.7°F | Resp 20 | Wt 132.8 lb

## 2013-02-13 DIAGNOSIS — C50919 Malignant neoplasm of unspecified site of unspecified female breast: Secondary | ICD-10-CM | POA: Diagnosis not present

## 2013-02-13 DIAGNOSIS — Z510 Encounter for antineoplastic radiation therapy: Secondary | ICD-10-CM | POA: Diagnosis not present

## 2013-02-13 DIAGNOSIS — M5430 Sciatica, unspecified side: Secondary | ICD-10-CM | POA: Diagnosis not present

## 2013-02-13 DIAGNOSIS — C50911 Malignant neoplasm of unspecified site of right female breast: Secondary | ICD-10-CM

## 2013-02-13 DIAGNOSIS — L589 Radiodermatitis, unspecified: Secondary | ICD-10-CM | POA: Diagnosis not present

## 2013-02-13 DIAGNOSIS — IMO0002 Reserved for concepts with insufficient information to code with codable children: Secondary | ICD-10-CM | POA: Diagnosis not present

## 2013-02-13 DIAGNOSIS — D0511 Intraductal carcinoma in situ of right breast: Secondary | ICD-10-CM

## 2013-02-13 MED ORDER — BIAFINE EX EMUL
Freq: Two times a day (BID) | CUTANEOUS | Status: DC
  Administered 2013-02-13: 10:00:00 via TOPICAL

## 2013-02-13 NOTE — Progress Notes (Signed)
Weekly Management Note:  Site: Right breast Current Dose:  3420  cGy Projected Dose: 5040  cGy, no boost  Narrative: The patient is seen today for routine under treatment assessment. CBCT/MVCT images/port films were reviewed. The chart was reviewed.   She is having more tenderness and pruritus along her right breast. She was using Radioplex gel.  Physical Examination:  Filed Vitals:   02/13/13 0929  BP: 126/83  Pulse: 59  Temp: 97.7 F (36.5 C)  Resp: 20  .  Weight: 132 lb 12.8 oz (60.238 kg). There is marked hyperpigmentation the skin but no areas of desquamation. Seroma/hematoma essentially unchanged.  Impression: Tolerating radiation therapy well. Radiation dermatitis as expected. She'll be given Biafine cream to use when necessary.  Plan: Continue radiation therapy as planned.

## 2013-02-13 NOTE — Progress Notes (Signed)
Pt states she has some tenderness, warmth in her right breast. She also reports itching of upper right breast treatment area and scar area. Gave her Biafine lotion to apply. Pt denies loss of appetite, fatigue.

## 2013-02-14 ENCOUNTER — Ambulatory Visit: Payer: Medicare Other

## 2013-02-15 ENCOUNTER — Encounter (HOSPITAL_COMMUNITY): Payer: Self-pay

## 2013-02-15 ENCOUNTER — Ambulatory Visit
Admission: RE | Admit: 2013-02-15 | Discharge: 2013-02-15 | Disposition: A | Discharge status: Home / Self Care | Payer: Medicare Other | Source: Ambulatory Visit | Attending: Radiation Oncology | Admitting: Radiation Oncology

## 2013-02-15 ENCOUNTER — Emergency Department (HOSPITAL_COMMUNITY)
Admission: EM | Admit: 2013-02-15 | Discharge: 2013-02-15 | Disposition: A | Discharge status: Home / Self Care | Payer: Medicare Other | Attending: Emergency Medicine | Admitting: Emergency Medicine

## 2013-02-15 ENCOUNTER — Ambulatory Visit: Payer: Medicare Other

## 2013-02-15 ENCOUNTER — Emergency Department (HOSPITAL_COMMUNITY): Payer: Medicare Other

## 2013-02-15 DIAGNOSIS — Z87448 Personal history of other diseases of urinary system: Secondary | ICD-10-CM | POA: Insufficient documentation

## 2013-02-15 DIAGNOSIS — Z862 Personal history of diseases of the blood and blood-forming organs and certain disorders involving the immune mechanism: Secondary | ICD-10-CM | POA: Diagnosis not present

## 2013-02-15 DIAGNOSIS — Z9889 Other specified postprocedural states: Secondary | ICD-10-CM | POA: Diagnosis not present

## 2013-02-15 DIAGNOSIS — Z7982 Long term (current) use of aspirin: Secondary | ICD-10-CM | POA: Insufficient documentation

## 2013-02-15 DIAGNOSIS — Z853 Personal history of malignant neoplasm of breast: Secondary | ICD-10-CM | POA: Insufficient documentation

## 2013-02-15 DIAGNOSIS — F17200 Nicotine dependence, unspecified, uncomplicated: Secondary | ICD-10-CM | POA: Diagnosis not present

## 2013-02-15 DIAGNOSIS — Z8619 Personal history of other infectious and parasitic diseases: Secondary | ICD-10-CM | POA: Insufficient documentation

## 2013-02-15 DIAGNOSIS — I10 Essential (primary) hypertension: Secondary | ICD-10-CM | POA: Insufficient documentation

## 2013-02-15 DIAGNOSIS — Z8709 Personal history of other diseases of the respiratory system: Secondary | ICD-10-CM | POA: Insufficient documentation

## 2013-02-15 DIAGNOSIS — M1990 Unspecified osteoarthritis, unspecified site: Secondary | ICD-10-CM | POA: Insufficient documentation

## 2013-02-15 DIAGNOSIS — M25569 Pain in unspecified knee: Secondary | ICD-10-CM | POA: Insufficient documentation

## 2013-02-15 DIAGNOSIS — L589 Radiodermatitis, unspecified: Secondary | ICD-10-CM | POA: Diagnosis not present

## 2013-02-15 DIAGNOSIS — C50919 Malignant neoplasm of unspecified site of unspecified female breast: Secondary | ICD-10-CM | POA: Diagnosis not present

## 2013-02-15 DIAGNOSIS — M5430 Sciatica, unspecified side: Secondary | ICD-10-CM | POA: Diagnosis not present

## 2013-02-15 DIAGNOSIS — M169 Osteoarthritis of hip, unspecified: Secondary | ICD-10-CM | POA: Diagnosis not present

## 2013-02-15 DIAGNOSIS — G8929 Other chronic pain: Secondary | ICD-10-CM

## 2013-02-15 DIAGNOSIS — M79609 Pain in unspecified limb: Secondary | ICD-10-CM | POA: Diagnosis not present

## 2013-02-15 DIAGNOSIS — K219 Gastro-esophageal reflux disease without esophagitis: Secondary | ICD-10-CM | POA: Diagnosis not present

## 2013-02-15 DIAGNOSIS — Z923 Personal history of irradiation: Secondary | ICD-10-CM | POA: Insufficient documentation

## 2013-02-15 DIAGNOSIS — IMO0002 Reserved for concepts with insufficient information to code with codable children: Secondary | ICD-10-CM | POA: Diagnosis not present

## 2013-02-15 DIAGNOSIS — Z8742 Personal history of other diseases of the female genital tract: Secondary | ICD-10-CM | POA: Diagnosis not present

## 2013-02-15 DIAGNOSIS — M25559 Pain in unspecified hip: Secondary | ICD-10-CM | POA: Diagnosis not present

## 2013-02-15 DIAGNOSIS — Z510 Encounter for antineoplastic radiation therapy: Secondary | ICD-10-CM | POA: Diagnosis not present

## 2013-02-15 MED ORDER — OXYCODONE-ACETAMINOPHEN 5-325 MG PO TABS
1.0000 | ORAL_TABLET | Freq: Once | ORAL | Status: AC
  Administered 2013-02-15: 1 via ORAL
  Filled 2013-02-15: qty 1

## 2013-02-15 MED ORDER — HYDROCODONE-ACETAMINOPHEN 7.5-300 MG PO TABS: 1.0000 | ORAL_TABLET | Freq: Four times a day (QID) | ORAL | Status: DC | PRN

## 2013-02-15 NOTE — ED Provider Notes (Signed)
CSN: [id number]     Arrival date & time 02/15/13  [id number] History   First MD Initiated Contact with Patient 02/15/13 1008     Chief Complaint  Patient presents with  . Hip Pain   (Consider location/radiation/quality/duration/timing/severity/associated sxs/prior Treatment) HPI Comments: Patient reports chronic left thigh pain for approximately 1 year.  States she initially had the pain after lumbar surgery with Dr Ronnald Ramp (neurosurgery).  It resolved and then returned.  States it has been constant and unchanged for many months.  Pt came to ED because she states she is not sleeping due to uncontrolled pain. Pain is described as aching and dull, worse with movement and ambulation.  Denies injury.  Denies weakness or numbness.  Denies back, hip, or knee pain.  Pt has a hx breast cancer, currently getting radiation.  She was previously seen by her neurosurgeon for her pain but has been followed by her PCP at [facility] for this pain, prescribed tramadol which she states does not control her pain.    Patient is a 62 y.o. female presenting with hip pain. The history is provided by the patient.  Hip Pain Pertinent negatives include no chills, fever, myalgias, numbness or weakness.    Past Medical History  Diagnosis Date  . GERD (gastroesophageal reflux disease)   . Hepatitis C   . Hypertension   . Low back pain   . Osteoarthritis   . Uterine fibroid   . Normocytic anemia     With thrombocytosis  . Right ureteral stone 2002  . Atelectasis 2002    Bilateral  . Bone spur 2008    Right calcaneal foot spur  . Lumbar spinal stenosis     S/P lumbar decompressive laminectomy, fusion and plating for lumbar spinal stensosis  . Breast cancer 2004    Ductal carcinoma in situ of the left breast; S/P left partial mastectomy 02/26/2003; S/P re-excision of cranial and lateral margins11/18/2004.  Marland Kitchen Hot flashes   . Wears dentures     top  . Breast cancer 09/21/2012    right breast  .  Hx of radiation therapy 2005    left breast  . 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 Autumn Messing; S/P re-excision of superior and inferior margins 10/27/2012.    Past Surgical History  Procedure Laterality Date  . Laminectomy  05/27/2009    Lumbar decompressive laminectomy, fusion and plating for lumbar spinal stensosis  . Back surgery    . Breast lumpectomy  2011    lt-  . Mastectomy, partial  02/26/2003    S/P left partial mastectomy 02/26/2003; S/P re-excision of cranial and lateral margins11/18/2004.   . Breast lumpectomy with needle localization and axillary sentinel lymph node bx Right 10/13/2012    Procedure: BREAST LUMPECTOMY WITH NEEDLE LOCALIZATION;  Surgeon: [name], MD;  Location: [place];  Service: General;  Laterality: Right;  Right breast wire localized lumpectomy  . Re-excision of breast cancer,superior margins Right 10/27/2012    Procedure: RE-EXCISION OF BREAST CANCER,SUPERIOR and inferior MARGINS;  Surgeon: [name], MD;  Location: [place];  Service: General;  Laterality: Right;   Family History  Problem Relation Age of Onset  . Colon cancer Mother 50  . Hypertension Mother   . Diabetes Sister 66  . Hypertension Sister   . Breast cancer Neg Hx   . Cervical cancer Neg Hx   .  Diabetes Brother   . Hypertension Brother   . Diabetes Brother   . Hypertension Brother   . Kidney disease Son     On dialysis  . Hypertension Son   . Diabetes Son   . Multiple sclerosis Son   . Bone cancer Sister 51   History  Substance Use Topics  . Smoking status: Current Some Day Smoker -- 0.20 packs/day for 32 years    Types: Cigarettes  . Smokeless tobacco: Never Used  . Alcohol Use: 0.0 oz/week    1-2 Cans of beer per week     Comment: Occasional    OB History   Grav Para Term Preterm Abortions TAB SAB Ect Mult Living   2 2 1 1            Review of Systems  Constitutional: Negative for  fever and chills.  Musculoskeletal: Negative for myalgias and back pain.  Skin: Negative for wound.  Neurological: Negative for weakness and numbness.    Allergies  Shrimp; Lisinopril; and Vicodin  Home Medications   Current Outpatient Rx  Name  Route  Sig  Dispense  Refill  . albuterol (PROAIR HFA) 108 (90 BASE) MCG/ACT inhaler   Inhalation   Inhale 2 puffs into the lungs every 6 (six) hours as needed for wheezing.   1 Inhaler   1   . amLODipine (NORVASC) 10 MG tablet   Oral   Take 10 mg by mouth daily.         Marland Kitchen aspirin 325 MG tablet   Oral   Take 1 tablet (325 mg total) by mouth daily.   100 tablet   3   . B Complex Vitamins (VITAMIN B COMPLEX PO)   Oral   Take 1 tablet by mouth daily.          . cetirizine (ZYRTEC) 10 MG tablet   Oral   Take 1 tablet (10 mg total) by mouth daily.   30 tablet   11   . cloNIDine (CATAPRES) 0.1 MG tablet   Oral   Take 0.1 mg by mouth 2 (two) times daily.         . famotidine (PEPCID) 20 MG tablet   Oral   Take 1 tablet (20 mg total) by mouth at bedtime.   30 tablet   11   . HYDROcodone-acetaminophen (NORCO/VICODIN) 5-325 MG per tablet   Oral   Take 1 tablet by mouth every 4 (four) hours as needed for pain.         . Multiple Vitamin (MULTIVITAMIN WITH MINERALS) TABS   Oral   Take 1 tablet by mouth daily.         Marland Kitchen oxyCODONE-acetaminophen (PERCOCET/ROXICET) 5-325 MG per tablet   Oral   Take 1 tablet by mouth every 6 (six) hours as needed for pain.   12 tablet   0   . traMADol (ULTRAM) 50 MG tablet   Oral   Take 1 tablet (50 mg total) by mouth every 6 (six) hours as needed for pain.   120 tablet   2   . zolpidem (AMBIEN) 5 MG tablet   Oral   Take 5 mg by mouth at bedtime as needed for sleep.          BP 132/98  Pulse 60  Temp(Src) 97.5 F (36.4 C) (Oral)  Resp 20  SpO2 99% Physical Exam  Nursing note and vitals reviewed. Constitutional: She appears well-developed and well-nourished. No  distress.  HENT:  Head:  Normocephalic and atraumatic.  Neck: Neck supple.  Cardiovascular: Intact distal pulses.   Pulmonary/Chest: Effort normal.  Musculoskeletal: Normal range of motion. She exhibits no edema.       Left hip: Normal.       Left knee: Normal.       Left upper leg: She exhibits no swelling, no edema, no deformity and no laceration.  Spine nontender, no crepitus, or stepoffs.   Left anterior thigh inconsistently tender to palpation.  Posterior thigh nontender.  No erythema, edema, or warmth.  Area of pain noted to be diffuse over entire left thigh.    Left lower extremity:  Strength 5/5, sensation intact, distal pulses intact.     Neurological: She is alert.  Skin: She is not diaphoretic.    ED Course  Procedures (including critical care time) Labs Review Labs Reviewed - No data to display Imaging Review Dg Hip Complete Left  02/15/2013   CLINICAL DATA:  Left hip pain. No injury. Breast cancer  EXAM: LEFT HIP - COMPLETE 2+ VIEW  COMPARISON:  None.  FINDINGS: Mild joint space narrowing left hip. Negative for fracture or mass. Negative for AVN.  Spinous process fusion device at L4-5.  IMPRESSION: Mild degenerative changes left hip joint. No acute abnormality.   Electronically Signed   By: Franchot Gallo M.D.   On: 02/15/2013 12:42   Dg Femur Left  02/15/2013   CLINICAL DATA:  Left hip pain. No injury. Breast cancer  EXAM: LEFT FEMUR - 2 VIEW  COMPARISON:  None.  FINDINGS: Negative for fracture or mass. Negative for metastatic disease.  IMPRESSION: Negative.   Electronically Signed   By: Franchot Gallo M.D.   On: 02/15/2013 12:43    MDM   1. Chronic leg pain, left     Patient with chronic pain in left thigh vs lumbar radiculopathy without injury.  Neurovascularly intact.  No red flags for back pain.  Treated by PCP for chronic pain and has been referred back to neurosurgery.  Xrays ordered due to active cancer diagnosis, to r/o mets.  Xrays are negative.  Pt has  chronic pain both from her breast cancer/radiation and from her leg vs lumbar radiculopathy.  A review of the records shows she has been having difficulty getting pain control and in contact with Dr Ethlyn Gallery office frequently for pain medications, on norco 5-325.  She is prescribed Tramadol by her PCP.  [place] DEA database shows great majority of prescriptions from PCP and Dr Marlou Starks, one from ED 8/19 last month.  Pt states she is currently out of norco (#30 prescribed 02/06/13).  Patient advised she needs to have her chronic pain medications prescribed by one person and recommended both follow up with PCP and Dr Ronnald Ramp (neurosurgery) if she has been referred back to him.  I did prescribe #8 Vicodin 7.5 as patient is currently experiencing significant pain and seems to have chronic uncontrolled pain. Discussed all results with patient.  Pt given return precautions.  Pt verbalizes understanding and agrees with plan.       Clayton Bibles, PA-C 02/15/13 1430

## 2013-02-15 NOTE — ED Notes (Signed)
She c/o non-traumatic left hip pain "for a while now".  She is in no distress.  C.M.S. Intact all toes/feet bilat.

## 2013-02-15 NOTE — ED Provider Notes (Signed)
Medical screening examination/treatment/procedure(s) were performed by non-physician practitioner and as supervising physician I was immediately available for consultation/collaboration.   Lolita Patella, MD 02/15/13 [phone]

## 2013-02-16 ENCOUNTER — Ambulatory Visit: Payer: Medicare Other

## 2013-02-17 ENCOUNTER — Ambulatory Visit: Payer: Medicare Other

## 2013-02-17 ENCOUNTER — Telehealth: Payer: Self-pay | Admitting: *Deleted

## 2013-02-17 NOTE — Telephone Encounter (Signed)
Left vm re: pt cancelled her radiation appointment today, missed two other appointments this week. Left call back name and number. 3:40 pm Called pt back, and spoke w/her. She states she came to [facility] ED yesterday, received Hydrocodone 7.5mg  8 tabs but has taken all the medication. She states her left hip, leg and back hurt, and she was told she has a pinched nerve. Pt states she has no pain medications at home. She states she has taken them all for her breast and leg pain. Pt states she "just went to bed and put on a heating pad. She states the heating pad "helped a little; she got back up but the pain came back". Advised she come Monday for treatment and to see Dr Valere Dross. Reminded pt that [facility] has dr on call over weekend. Pt verbalized understanding.

## 2013-02-20 ENCOUNTER — Encounter: Payer: Self-pay | Admitting: Radiation Oncology

## 2013-02-20 ENCOUNTER — Ambulatory Visit: Payer: Medicare Other

## 2013-02-20 ENCOUNTER — Ambulatory Visit
Admission: RE | Admit: 2013-02-20 | Discharge: 2013-02-20 | Disposition: A | Discharge status: Home / Self Care | Payer: Medicare Other | Source: Ambulatory Visit | Attending: Radiation Oncology | Admitting: Radiation Oncology

## 2013-02-20 VITALS — BP 153/92 | HR 63 | Temp 98.1°F | Resp 20 | Wt 129.3 lb

## 2013-02-20 DIAGNOSIS — M5430 Sciatica, unspecified side: Secondary | ICD-10-CM | POA: Diagnosis not present

## 2013-02-20 DIAGNOSIS — Z510 Encounter for antineoplastic radiation therapy: Secondary | ICD-10-CM | POA: Diagnosis not present

## 2013-02-20 DIAGNOSIS — L589 Radiodermatitis, unspecified: Secondary | ICD-10-CM | POA: Diagnosis not present

## 2013-02-20 DIAGNOSIS — C50911 Malignant neoplasm of unspecified site of right female breast: Secondary | ICD-10-CM

## 2013-02-20 DIAGNOSIS — IMO0002 Reserved for concepts with insufficient information to code with codable children: Secondary | ICD-10-CM | POA: Diagnosis not present

## 2013-02-20 DIAGNOSIS — C50319 Malignant neoplasm of lower-inner quadrant of unspecified female breast: Secondary | ICD-10-CM | POA: Diagnosis not present

## 2013-02-20 DIAGNOSIS — C50919 Malignant neoplasm of unspecified site of unspecified female breast: Secondary | ICD-10-CM | POA: Diagnosis not present

## 2013-02-20 NOTE — Progress Notes (Signed)
Pt continues to c/o left hip pain which radiates down to her knee. She states she "feels pins and needles in her left knee". Pt  states she alternates taking Tylenol, Advil, Ultram w/o relief. She states she takes medications every 2 hours. She states she is also using heating pad. She states she was given a Cortisone shot, had x rays taken in ED. She states she was told she may have a pinched nerve from previous back surgery.  Pt applying Radiaplex to right breast, one small area desquamation under breast; advised she apply antibiotic ointment on this area. Pt c/o tenderness of right breast.

## 2013-02-20 NOTE — Progress Notes (Signed)
Weekly Management Note:  Site: Right breast  Current Dose:  3780  cGy Projected Dose: 5040  cGy (no boost)  Narrative: The patient is seen today for routine under treatment assessment. CBCT/MVCT images/port films were reviewed. The chart was reviewed.   She has been to the emergency room because of low back pain radiating to her left knee. She is to see Dr. Sherley Bounds who previously perform surgery. She uses Radioplex gel along her right breast.  Physical Examination:  Filed Vitals:   02/20/13 0930  BP: 153/92  Pulse: 63  Temp: 98.1 F (36.7 C)  Resp: 20  .  Weight: 129 lb 4.8 oz (58.65 kg). There is marked hyperpigmentation the skin with dry desquamation along the right inframammary region. Her hematoma appears to be stable on palpation.  Impression: Tolerating radiation therapy well. She is to see Dr. Sherley Bounds regarding her low back pain.  Plan: Continue radiation therapy as planned.

## 2013-02-21 ENCOUNTER — Ambulatory Visit: Payer: Medicare Other

## 2013-02-21 ENCOUNTER — Ambulatory Visit
Admission: RE | Admit: 2013-02-21 | Discharge: 2013-02-21 | Disposition: A | Discharge status: Home / Self Care | Payer: Medicare Other | Source: Ambulatory Visit | Attending: Radiation Oncology | Admitting: Radiation Oncology

## 2013-02-21 ENCOUNTER — Ambulatory Visit (INDEPENDENT_AMBULATORY_CARE_PROVIDER_SITE_OTHER): Payer: Medicare Other | Admitting: General Surgery

## 2013-02-21 ENCOUNTER — Encounter (INDEPENDENT_AMBULATORY_CARE_PROVIDER_SITE_OTHER): Payer: Self-pay | Admitting: General Surgery

## 2013-02-21 VITALS — BP 132/90 | HR 68 | Temp 97.0°F | Resp 14 | Ht 64.0 in | Wt 134.6 lb

## 2013-02-21 DIAGNOSIS — C50919 Malignant neoplasm of unspecified site of unspecified female breast: Secondary | ICD-10-CM

## 2013-02-21 DIAGNOSIS — C50911 Malignant neoplasm of unspecified site of right female breast: Secondary | ICD-10-CM

## 2013-02-21 DIAGNOSIS — Z510 Encounter for antineoplastic radiation therapy: Secondary | ICD-10-CM | POA: Diagnosis not present

## 2013-02-21 DIAGNOSIS — L589 Radiodermatitis, unspecified: Secondary | ICD-10-CM | POA: Diagnosis not present

## 2013-02-21 DIAGNOSIS — M5430 Sciatica, unspecified side: Secondary | ICD-10-CM | POA: Diagnosis not present

## 2013-02-21 DIAGNOSIS — IMO0002 Reserved for concepts with insufficient information to code with codable children: Secondary | ICD-10-CM | POA: Diagnosis not present

## 2013-02-21 NOTE — Patient Instructions (Signed)
Continue radiation therapy Call medical doctor for more pain medicine

## 2013-02-21 NOTE — Progress Notes (Signed)
Subjective:     Patient ID: Monique Henderson, female   DOB: [birth year], 62 y.o.   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 She comes in today complaining of mostly pain in her hip but some soreness in the right breast. She is in the middle of radiation therapy.  Review of Systems  Constitutional: Negative.   HENT: Negative.   Eyes: Negative.   Respiratory: Negative.   Cardiovascular: Negative.   Gastrointestinal: Negative.   Endocrine: Negative.   Genitourinary: Negative.   Musculoskeletal: Positive for arthralgias.  Skin: Negative.   Allergic/Immunologic: Negative.   Neurological: Negative.   Hematological: Negative.   Psychiatric/Behavioral: Negative.        Objective:   Physical Exam  Constitutional: She is oriented to person, place, and time. She appears well-developed and well-nourished.  HENT:  Head: Normocephalic and atraumatic.  Eyes: Conjunctivae and EOM are normal. Pupils are equal, round, and reactive to light.  Neck: Normal range of motion. Neck supple.  Cardiovascular: Normal rate, regular rhythm and normal heart sounds.   Pulmonary/Chest: Effort normal and breath sounds normal.  She has a palpable fullness beneath her incision on the inner right breast which is most likely radiation of her stroma cavity. Otherwise there is no other palpable mass in either breast. There is no palpable axillary, supraclavicular, or cervical lymphadenopathy  Abdominal: Soft. Bowel sounds are normal.  Musculoskeletal: Normal range of motion.  Lymphadenopathy:    She has no cervical adenopathy.  Neurological: She is alert and oriented to person, place, and time.  Skin: Skin is warm and dry.  Psychiatric: She has a normal mood and affect. Her behavior is normal.       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 She will continue to followup with medical and radiation oncology. I will plan to see her back in about 6 months.

## 2013-02-22 ENCOUNTER — Ambulatory Visit: Payer: Medicare Other

## 2013-02-22 ENCOUNTER — Ambulatory Visit (INDEPENDENT_AMBULATORY_CARE_PROVIDER_SITE_OTHER): Payer: Medicare Other | Admitting: Internal Medicine

## 2013-02-22 VITALS — BP 169/97 | HR 70 | Temp 97.1°F

## 2013-02-22 DIAGNOSIS — N259 Disorder resulting from impaired renal tubular function, unspecified: Secondary | ICD-10-CM

## 2013-02-22 DIAGNOSIS — M545 Low back pain, unspecified: Secondary | ICD-10-CM | POA: Diagnosis not present

## 2013-02-22 DIAGNOSIS — I10 Essential (primary) hypertension: Secondary | ICD-10-CM

## 2013-02-22 LAB — CBC WITH DIFFERENTIAL/PLATELET
Basophils Absolute: 0 10*3/uL (ref 0.0–0.1)
Basophils Relative: 0 % (ref 0–1)
Eosinophils Relative: 4 % (ref 0–5)
HCT: 40.5 % (ref 36.0–46.0)
Hemoglobin: 13.7 g/dL (ref 12.0–15.0)
MCHC: 33.8 g/dL (ref 30.0–36.0)
MCV: 88.8 fL (ref 78.0–100.0)
Monocytes Absolute: 0.7 10*3/uL (ref 0.1–1.0)
Monocytes Relative: 13 % — ABNORMAL HIGH (ref 3–12)
RDW: 13.7 % (ref 11.5–15.5)

## 2013-02-22 LAB — COMPLETE METABOLIC PANEL WITH GFR
ALT: 26 U/L (ref 0–35)
AST: 25 U/L (ref 0–37)
Albumin: 4.3 g/dL (ref 3.5–5.2)
Alkaline Phosphatase: 76 U/L (ref 39–117)
Glucose, Bld: 115 mg/dL — ABNORMAL HIGH (ref 70–99)
Potassium: 3.9 mEq/L (ref 3.5–5.3)
Sodium: 143 mEq/L (ref 135–145)
Total Bilirubin: 0.3 mg/dL (ref 0.3–1.2)
Total Protein: 7.3 g/dL (ref 6.0–8.3)

## 2013-02-22 LAB — SEDIMENTATION RATE: Sed Rate: 4 mm/hr (ref 0–22)

## 2013-02-22 MED ORDER — LOSARTAN POTASSIUM 25 MG PO TABS: 25.0000 mg | ORAL_TABLET | Freq: Every day | ORAL | Status: DC

## 2013-02-22 MED ORDER — OXYCODONE-ACETAMINOPHEN 7.5-325 MG PO TABS: 1.0000 | ORAL_TABLET | Freq: Four times a day (QID) | ORAL | Status: DC | PRN

## 2013-02-22 NOTE — Patient Instructions (Signed)
Take oxycodone-acetaminophen 7.5-325 mg one tablet every 6 hours as needed for pain. Return in one week; bring all your medications to that visit. Please keep your scheduled appointment with your neurosurgeon Dr. Ronnald Ramp.

## 2013-02-22 NOTE — Assessment & Plan Note (Signed)
BP Readings from Last 3 Encounters:  02/22/13 169/97  02/21/13 132/90  02/20/13 153/92    Lab Results  Component Value Date   NA 141 12/21/2012   K 4.3 12/21/2012   CREATININE 1.38* 12/21/2012    Assessment: Blood pressure control: moderately elevated Progress toward BP goal:  deteriorated Comments: Patient's blood pressure is elevated today, likely due to her pain.  She reports that she is compliant with her medications, which include amlodipine 10 mg daily, clonidine 0.1 mg twice a day, and losartan 25 mg daily.  Plan: Medications:  continue current medications

## 2013-02-22 NOTE — Assessment & Plan Note (Signed)
Lab Results  Component Value Date   CREATININE 1.38* 12/21/2012   CREATININE 1.65* 10/27/2012   CREATININE 1.30* 10/13/2012   CREATININE 2.44* 10/12/2012   CREATININE 1.2* 09/28/2012   CREATININE 1.66* 04/20/2012     Assessment:  Patient has relatively stable chronic renal insufficiency.  Plan:  Check a comprehensive metabolic panel today.

## 2013-02-22 NOTE — Progress Notes (Signed)
  Subjective:    Patient ID: Monique Henderson, female    DOB: 20-Apr-1951, 62 y.o.   MRN: [id number]  HPI Patient presents with a chief complaint of left low back pain radiating into her left posterolateral buttock for which she was seen recently in the emergency department.  She has chronic low back pain, and reports that her pain became much worse about 3 weeks ago.  She reports that the pain is not adequately relieved with hydrocodone/acetaminophen.  She has an appointment scheduled with her neurosurgeon Dr. Ronnald Ramp on 03/14/2013 at 2:30 PM.  She did not bring her medications to clinic, but affirmed that there have been no changes in her medication list.     Review of Systems  Musculoskeletal: Positive for back pain.       Objective:   Physical Exam  Constitutional: She appears distressed (Patient was intermittently moaning).  Cardiovascular: Normal rate and regular rhythm.  Exam reveals no gallop and no friction rub.   No murmur heard. Pulmonary/Chest: Effort normal and breath sounds normal. No respiratory distress. She has no wheezes. She has no rales.  Abdominal: Bowel sounds are normal. She exhibits no distension. There is no tenderness. There is no rebound and no guarding.  Musculoskeletal: She exhibits no edema.       Right hip: Normal.       Left hip: Normal.  Neurological: Straight leg raise was negative on right; straight leg raise on left produced left low back pain radiating into her upper buttock, but not radiating below the knee; strength appeared intact in both lower extremities, although exam of the left lower extremity was limited by pain      Assessment & Plan:

## 2013-02-22 NOTE — Assessment & Plan Note (Addendum)
Assessment: Patient has chronic low back pain, with acute worsening over the past 3 weeks now with marked left low back pain radiating into her left buttock.  Her pain is uncontrolled on oral hydrocodone/acetaminophen or tramadol.  She has a history of lumbar spinal stenosis and is status post lumbar decompressive laminectomy, fusion, and plating by Dr. Eustace Moore on 05/27/2009.  She has an appointment scheduled with Dr. Ronnald Ramp in October.  We have referred her to more than one pain clinic for management of her chronic back pain, but she has been declined by those clinics.  Plan: Given the severity of her pain, I recommended that we admit patient to hospital today for pain control, imaging of her back, and neurosurgical consultation; however, she refused hospital admission.  The plan is to change her pain medication to oxycodone-acetaminophen 7.5-325 mg one tablet every 6 hours as needed for pain (I get a prescription for a one-week supply); contact Dr. Ronnald Ramp for advice on the best way to image her lumbosacral spine given the hardware that is present; check CBC with differential, CRP, and ESR; have patient return in one week for reassessment.  I advised her to come in immediately if she has worsening of her symptoms.

## 2013-02-23 ENCOUNTER — Ambulatory Visit: Payer: Medicare Other

## 2013-02-24 ENCOUNTER — Ambulatory Visit: Payer: Medicare Other

## 2013-02-27 ENCOUNTER — Ambulatory Visit: Payer: Medicare Other

## 2013-02-27 ENCOUNTER — Encounter: Payer: Self-pay | Admitting: Internal Medicine

## 2013-02-27 ENCOUNTER — Observation Stay (HOSPITAL_COMMUNITY)
Admission: AD | Admit: 2013-02-27 | Discharge: 2013-03-01 | Disposition: A | Discharge status: Home / Self Care | Payer: Medicare Other | Source: Ambulatory Visit | Attending: Internal Medicine | Admitting: Internal Medicine

## 2013-02-27 ENCOUNTER — Encounter: Payer: Self-pay | Admitting: Radiation Oncology

## 2013-02-27 ENCOUNTER — Ambulatory Visit (INDEPENDENT_AMBULATORY_CARE_PROVIDER_SITE_OTHER): Payer: Medicare Other | Admitting: Internal Medicine

## 2013-02-27 ENCOUNTER — Encounter (HOSPITAL_COMMUNITY): Payer: Self-pay | Admitting: Internal Medicine

## 2013-02-27 ENCOUNTER — Telehealth: Payer: Self-pay | Admitting: *Deleted

## 2013-02-27 VITALS — BP 156/94 | HR 77 | Temp 97.6°F | Wt 131.3 lb

## 2013-02-27 DIAGNOSIS — M545 Low back pain, unspecified: Secondary | ICD-10-CM | POA: Diagnosis not present

## 2013-02-27 DIAGNOSIS — N184 Chronic kidney disease, stage 4 (severe): Secondary | ICD-10-CM | POA: Diagnosis present

## 2013-02-27 DIAGNOSIS — I10 Essential (primary) hypertension: Secondary | ICD-10-CM | POA: Diagnosis not present

## 2013-02-27 DIAGNOSIS — F1410 Cocaine abuse, uncomplicated: Secondary | ICD-10-CM

## 2013-02-27 DIAGNOSIS — G8929 Other chronic pain: Principal | ICD-10-CM | POA: Insufficient documentation

## 2013-02-27 DIAGNOSIS — Z981 Arthrodesis status: Secondary | ICD-10-CM | POA: Diagnosis not present

## 2013-02-27 DIAGNOSIS — M48061 Spinal stenosis, lumbar region without neurogenic claudication: Secondary | ICD-10-CM | POA: Diagnosis present

## 2013-02-27 DIAGNOSIS — K219 Gastro-esophageal reflux disease without esophagitis: Secondary | ICD-10-CM | POA: Diagnosis not present

## 2013-02-27 DIAGNOSIS — E876 Hypokalemia: Secondary | ICD-10-CM | POA: Diagnosis not present

## 2013-02-27 DIAGNOSIS — N289 Disorder of kidney and ureter, unspecified: Secondary | ICD-10-CM | POA: Insufficient documentation

## 2013-02-27 DIAGNOSIS — Z853 Personal history of malignant neoplasm of breast: Secondary | ICD-10-CM | POA: Diagnosis not present

## 2013-02-27 DIAGNOSIS — M1990 Unspecified osteoarthritis, unspecified site: Secondary | ICD-10-CM

## 2013-02-27 LAB — COMPREHENSIVE METABOLIC PANEL
AST: 30 U/L (ref 0–37)
Albumin: 3.5 g/dL (ref 3.5–5.2)
BUN: 19 mg/dL (ref 6–23)
Calcium: 8.9 mg/dL (ref 8.4–10.5)
Chloride: 104 mEq/L (ref 96–112)
Creatinine, Ser: 1.32 mg/dL — ABNORMAL HIGH (ref 0.50–1.10)
Sodium: 142 mEq/L (ref 135–145)
Total Protein: 6.6 g/dL (ref 6.0–8.3)

## 2013-02-27 LAB — CBC
HCT: 38.4 % (ref 36.0–46.0)
Hemoglobin: 13.2 g/dL (ref 12.0–15.0)
MCH: 30.3 pg (ref 26.0–34.0)
MCHC: 34.4 g/dL (ref 30.0–36.0)
MCV: 88.1 fL (ref 78.0–100.0)
RBC: 4.36 MIL/uL (ref 3.87–5.11)
RDW: 13.4 % (ref 11.5–15.5)
WBC: 4.2 10*3/uL (ref 4.0–10.5)

## 2013-02-27 LAB — RAPID URINE DRUG SCREEN, HOSP PERFORMED
Amphetamines: NOT DETECTED
Barbiturates: NOT DETECTED
Benzodiazepines: NOT DETECTED
Cocaine: POSITIVE — AB
Opiates: POSITIVE — AB
Tetrahydrocannabinol: NOT DETECTED

## 2013-02-27 MED ORDER — ONDANSETRON HCL 4 MG PO TABS: 4.0000 mg | ORAL_TABLET | Freq: Four times a day (QID) | ORAL | Status: DC | PRN

## 2013-02-27 MED ORDER — DOCUSATE SODIUM 100 MG PO CAPS
100.0000 mg | ORAL_CAPSULE | Freq: Two times a day (BID) | ORAL | Status: DC
  Administered 2013-02-27 – 2013-03-01 (×4): 100 mg via ORAL
  Filled 2013-02-27 (×3): qty 1

## 2013-02-27 MED ORDER — SODIUM CHLORIDE 0.9 % IV SOLN: 250.0000 mL | INTRAVENOUS | Status: DC | PRN

## 2013-02-27 MED ORDER — ONDANSETRON HCL 4 MG/2ML IJ SOLN
4.0000 mg | Freq: Four times a day (QID) | INTRAMUSCULAR | Status: DC | PRN
Start: 2013-02-27 — End: 2013-03-01

## 2013-02-27 MED ORDER — FAMOTIDINE 20 MG PO TABS
20.0000 mg | ORAL_TABLET | Freq: Every day | ORAL | Status: DC
  Administered 2013-02-27 – 2013-02-28 (×2): 20 mg via ORAL
  Filled 2013-02-27 (×3): qty 1

## 2013-02-27 MED ORDER — POTASSIUM CHLORIDE CRYS ER 20 MEQ PO TBCR
40.0000 meq | EXTENDED_RELEASE_TABLET | Freq: Once | ORAL | Status: AC
  Administered 2013-02-27: 40 meq via ORAL
  Filled 2013-02-27: qty 2

## 2013-02-27 MED ORDER — MORPHINE SULFATE 2 MG/ML IJ SOLN
1.0000 mg | INTRAMUSCULAR | Status: DC | PRN
  Administered 2013-02-27 – 2013-03-01 (×12): 2 mg via INTRAVENOUS
  Filled 2013-02-27 (×12): qty 1

## 2013-02-27 MED ORDER — ASPIRIN 325 MG PO TABS
325.0000 mg | ORAL_TABLET | Freq: Every day | ORAL | Status: DC
  Administered 2013-02-27 – 2013-03-01 (×3): 325 mg via ORAL
  Filled 2013-02-27 (×3): qty 1

## 2013-02-27 MED ORDER — ADULT MULTIVITAMIN W/MINERALS CH
1.0000 | ORAL_TABLET | Freq: Every day | ORAL | Status: DC
  Administered 2013-02-27 – 2013-03-01 (×3): 1 via ORAL
  Filled 2013-02-27 (×3): qty 1

## 2013-02-27 MED ORDER — DIPHENHYDRAMINE HCL 25 MG PO CAPS
25.0000 mg | ORAL_CAPSULE | Freq: Three times a day (TID) | ORAL | Status: DC | PRN
  Administered 2013-02-28: 25 mg via ORAL
  Filled 2013-02-27: qty 1

## 2013-02-27 MED ORDER — LOSARTAN POTASSIUM 25 MG PO TABS
25.0000 mg | ORAL_TABLET | Freq: Every day | ORAL | Status: DC
  Administered 2013-02-27 – 2013-03-01 (×3): 25 mg via ORAL
  Filled 2013-02-27 (×3): qty 1

## 2013-02-27 MED ORDER — ALBUTEROL SULFATE HFA 108 (90 BASE) MCG/ACT IN AERS: 2.0000 | INHALATION_SPRAY | Freq: Four times a day (QID) | RESPIRATORY_TRACT | Status: DC | PRN

## 2013-02-27 MED ORDER — HEPARIN SODIUM (PORCINE) 5000 UNIT/ML IJ SOLN
5000.0000 [IU] | Freq: Three times a day (TID) | INTRAMUSCULAR | Status: DC
  Administered 2013-02-27 – 2013-03-01 (×5): 5000 [IU] via SUBCUTANEOUS
  Filled 2013-02-27 (×8): qty 1

## 2013-02-27 MED ORDER — CLONIDINE HCL 0.1 MG PO TABS
0.1000 mg | ORAL_TABLET | Freq: Two times a day (BID) | ORAL | Status: DC
  Administered 2013-02-27 – 2013-03-01 (×4): 0.1 mg via ORAL
  Filled 2013-02-27 (×5): qty 1

## 2013-02-27 MED ORDER — SODIUM CHLORIDE 0.9 % IJ SOLN
3.0000 mL | Freq: Two times a day (BID) | INTRAMUSCULAR | Status: DC
  Administered 2013-02-27 – 2013-03-01 (×3): 3 mL via INTRAVENOUS

## 2013-02-27 MED ORDER — AMLODIPINE BESYLATE 10 MG PO TABS
10.0000 mg | ORAL_TABLET | Freq: Every day | ORAL | Status: DC
  Administered 2013-02-27 – 2013-03-01 (×3): 10 mg via ORAL
  Filled 2013-02-27 (×3): qty 1

## 2013-02-27 MED ORDER — SODIUM CHLORIDE 0.9 % IJ SOLN: 3.0000 mL | INTRAMUSCULAR | Status: DC | PRN

## 2013-02-27 MED ORDER — ZOLPIDEM TARTRATE 5 MG PO TABS: 5.0000 mg | ORAL_TABLET | Freq: Every evening | ORAL | Status: DC | PRN

## 2013-02-27 NOTE — Telephone Encounter (Signed)
Pt calls and states she now agrees that she needs to come in to hospital to get some help with pain, could she be a direct admit?

## 2013-02-27 NOTE — Telephone Encounter (Signed)
It would be better if she could be seen in the clinic today with a view toward admitting her if needed; do we have open appointments?

## 2013-02-27 NOTE — Progress Notes (Signed)
Chart note: The patient called earlier today to inform us that she was being admitted to [facility] for management of back pain. She has missed numerous treatments during her scheduled 5 and one half week course of radiation therapy. She  has 6 more scheduled treatments, but would probably need close to 10 treatments with her treatment interruptions to give a similar biological equivalent dose to her right breast. It is hoped that she can be discharged as soon as possible or transferred to the [facility], if possible, for completion of radiation therapy as an inpatient.

## 2013-02-27 NOTE — H&P (Signed)
Date: 02/27/2013               Patient Name:  JANAVIA SPIZZIRRI MRN: [id number]  DOB: [birth year] Age / Sex: 62 y.o., female   PCP: Axel Filler, MD         Medical Service: Internal Medicine Teaching Service         Attending Physician: Dr. Bartholomew Crews, MD    First Contact: Dr. Mechele Claude Pager: [phone]  Second Contact: Dr. Eulas Post Pager: [phone]       After Hours (After 5p/  First Contact Pager: [phone]  weekends / holidays): Second Contact Pager: [phone]   Chief Complaint: back pain  History of Present Illness:  Ms. Abernathey is a 62yo woman with PMH lumbar spinal stenosis s/p laminectomy and fusion in 2010 by Dr. Ronnald Ramp (neurosurgery), HTN, Hep C, GERD, bilateral breast CA (currently receiving radiation to R breast at [facility]) who presents with c/o worsening sharp lower back pain with radiation down L buttock into her lateral L leg x 3 weeks, pain now 10/10. Patient has spinal stenosis with chronic low back pain, but reports the current episode has been worsening for a few weeks now, unresponsive to tramadol (which previously controlled her pain well) and percocet. Pain is worsened by prolonged sitting, improved by "nothing" and has been inhibiting her sleep. Pt was given a cortisone injection to her L hip 3 weeks ago, but this did not help her pain, instead it made it worse. She is followed by Dr. Marinda Elk in clinic and she was seen last week (9/24) at which time her pain was obviously worse than her baseline and Dr. Marinda Elk had recommended admission for pain management and neurosurgery consult, though patient refused admission. Instead, she was sent home with 1 week supply of percocet and agreed to f/u with neruosurgery. Pt returned to clinic today for continued pain. She has run out of her percocet and was not seen by neurosurgery in the interim. Given her uncontrolled pain, pt was admitted for pain control and neurosurgery consultation. Denies saddle paresthesia, bowel or bladder  incontinence, bilateral leg weakness, F/C. She does endorse feeling as though her L leg is heavier than her R when she walks and may be weaker than her R leg. Additionally, there is some numbness/tingling to L lateral leg. She endorses having some nightsweats intermittently over the past year, but this is not new. Patient has been referred to multiple pain clinics in the past, but given her remote drug use, pt has been declined from all.   Pt had L hip xray done 02/22/13 that showed mild degenerative changes to L hip as well as a L femur xray that was negative. Pt is unable to receive MRI given she has metal hardware from prevous back surgery. During clinic last week she had a BMP significant for Cr. 1.33 (baseline), bicarb 33 (baseline), CBC wnl, CRP wnl, ESR wnl.  Pt has appointment with Dr. Ronnald Ramp on 03/14/2013 at 2:30pm.  Meds: Medications Prior to Admission  Medication Sig Dispense Refill  . albuterol (PROAIR HFA) 108 (90 BASE) MCG/ACT inhaler Inhale 2 puffs into the lungs every 6 (six) hours as needed for wheezing.  1 Inhaler  1  . amLODipine (NORVASC) 10 MG tablet Take 10 mg by mouth daily.      Marland Kitchen aspirin 325 MG tablet Take 1 tablet (325 mg total) by mouth daily.  100 tablet  3  . B Complex Vitamins (VITAMIN B COMPLEX PO) Take 1 tablet by  mouth daily.       . cetirizine (ZYRTEC) 10 MG tablet Take 1 tablet (10 mg total) by mouth daily.  30 tablet  11  . cloNIDine (CATAPRES) 0.1 MG tablet Take 0.1 mg by mouth 2 (two) times daily.      . famotidine (PEPCID) 20 MG tablet Take 1 tablet (20 mg total) by mouth at bedtime.  30 tablet  11  . losartan (COZAAR) 25 MG tablet Take 1 tablet (25 mg total) by mouth daily.      . Multiple Vitamin (MULTIVITAMIN WITH MINERALS) TABS Take 1 tablet by mouth daily.      Marland Kitchen oxyCODONE-acetaminophen (PERCOCET) 7.5-325 MG per tablet Take 1 tablet by mouth every 6 (six) hours as needed for pain.  30 tablet  0  . traMADol (ULTRAM) 50 MG tablet Take 1 tablet (50 mg total)  by mouth every 6 (six) hours as needed for pain.  120 tablet  2  . zolpidem (AMBIEN) 5 MG tablet Take 5 mg by mouth at bedtime as needed for sleep.       Allergies: Allergies as of 02/27/2013 - Review Complete 02/27/2013  Allergen Reaction Noted  . Shrimp [shellfish allergy] Shortness Of Breath 09/08/2012  . Lisinopril Itching and Cough 09/08/2012  . Vicodin [hydrocodone-acetaminophen] Itching and Nausea And Vomiting 01/17/2013   Past Medical History  Diagnosis Date  . GERD (gastroesophageal reflux disease)   . Hepatitis C   . Hypertension   . Low back pain   . Osteoarthritis   . Uterine fibroid   . Normocytic anemia     With thrombocytosis  . Right ureteral stone 2002  . Atelectasis 2002    Bilateral  . Bone spur 2008    Right calcaneal foot spur  . Lumbar spinal stenosis     S/P lumbar decompressive laminectomy, fusion and plating for lumbar spinal stensosis  . Breast cancer 2004    Ductal carcinoma in situ of the left breast; S/P left partial mastectomy 02/26/2003; S/P re-excision of cranial and lateral margins11/18/2004.  Marland Kitchen Hot flashes   . Wears dentures     top  . Breast cancer 09/21/2012    right breast  . Hx of radiation therapy 2005    left breast  . 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 Autumn Messing; S/P re-excision of superior and inferior margins 10/27/2012.    Past Surgical History  Procedure Laterality Date  . Laminectomy  05/27/2009    Lumbar decompressive laminectomy, fusion and plating for lumbar spinal stensosis  . Back surgery    . Breast lumpectomy  2011    lt-  . Mastectomy, partial  02/26/2003    S/P left partial mastectomy 02/26/2003; S/P re-excision of cranial and lateral margins11/18/2004.   . Breast lumpectomy with needle localization and axillary sentinel lymph node bx Right 10/13/2012    Procedure: BREAST LUMPECTOMY WITH NEEDLE LOCALIZATION;  Surgeon: [name], MD;  Location: [facility];  Service: General;  Laterality: Right;  Right breast wire localized lumpectomy  . Re-excision of breast cancer,superior margins Right 10/27/2012    Procedure: RE-EXCISION OF BREAST CANCER,SUPERIOR and inferior MARGINS;  Surgeon: [name], MD;  Location: [facility];  Service: General;  Laterality: Right;   Family History  Problem Relation Age of Onset  . Colon cancer Mother 95  . Hypertension Mother   . Diabetes Sister 33  . Hypertension Sister   .  Breast cancer Neg Hx   . Cervical cancer Neg Hx   . Diabetes Brother   . Hypertension Brother   . Diabetes Brother   . Hypertension Brother   . Kidney disease Son     On dialysis  . Hypertension Son   . Diabetes Son   . Multiple sclerosis Son   . Bone cancer Sister 39   History   Social History  . Marital Status: Single    Spouse Name: N/A    Number of Children: N/A  . Years of Education: N/A   Occupational History  . Not on file.   Social History Main Topics  . Smoking status: Current Some Day Smoker -- 0.20 packs/day for 32 years    Types: Cigarettes  . Smokeless tobacco: Never Used  . Alcohol Use: 0.0 oz/week    1-2 Cans of beer per week     Comment: Occasional   . Drug Use: Yes     Comment: no current use, previous use of cocaine  . Sexual Activity: Not on file   Other Topics Concern  . Not on file   Social History Narrative  . No narrative on file   Review of Systems: General: See HPI Skin: no rash HEENT: no blurry vision, hearing changes, sore throat Pulm: no dyspnea, coughing, wheezing CV: no chest pain, palpitations, shortness of breath Abd: no abdominal pain, nausea/vomiting, diarrhea/constipation GU: no dysuria, hematuria, polyuria Back: See HPI Ext: see HPI Neuro: See HPI  Physical Exam: Blood pressure 170/96, pulse 57, temperature 98.4 F (36.9 C), temperature source Oral, height 5\' 4"  (1.626 m), weight 58.741 kg (129 lb 8 oz), SpO2 100.00%.  General: appears uncomfortable  in bed and is squirming around, though is alert, cooperative HEENT: pupils equal round and reactive to light, vision grossly intact, oropharynx clear and non-erythematous  Neck: supple Lungs: clear to ascultation bilaterally, normal work of respiration, no wheezes, rales, ronchi Heart: regular rate and rhythm, no murmurs, gallops, or rubs Abdomen: soft, non-tender, non-distended, normal bowel sounds Extremities: warm extremities bilaterally, no BLE edema Back: no point ttp over spinous processes, not ttp over paraspinal muscles bilaterally, but there is ttp over L lateral hip; SLR negative bilaterally, AROM intact Neurologic: alert & oriented X3, cranial nerves II-XII intact, strength 5/5 to lower extremities, upper extremity strength grossly intact, sensation intact to light touch; gait is normal  Lab results: None- will check labs this evening    Assessment & Plan by Problem: Ms. Mahaney is a 62yo woman with PMH lumbar spinal stenosis s/p laminectomy and fusion in 2010, HTN, Hep C, GERD, bilateral breast CA (currently receiving radiation to R breast at [facility]) who presents with c/o worsening sharp lower back pain with radiation down L buttock into her lateral L leg x 3 weeks likely representing disc herniation w/ nerve root compression.  # Left lower back pain w/ radiculopathy: Patient with chronic low back pain, though worsening over the past three weeks prior to admission. This likely represents disc herniation w/ nerve root compression. However, given patient's intermittent nightsweats combined with her hx of breast cancer, vertebral metastasis cannot be ruled out. I believe this diagnosis is unlikely given she had no point tenderness over her spine and that her night sweats have been going on for approx. 1 year. Considering patient had L hip xray revealing degenerative changes last week, this may represent some OA of her L hip, though the radiation indicates a nerve root is involved as well so  this  likely does not explain patient's entire problem. Patient's work up is complicated by the fact that she has metal hardware in her back from her spinal fusion and therefore cannot receive an MRI. Will consult neurosurgery to obtain their recommendations for further management. -morphine 1-2mg  q3h prn -neurosurgery consult -CBC and CMP, now -colace prn -zofran prn -benadryl prn  # HTN: BPs stable 150s-170s/90s, likely elevated due to significant pain. Will continue to monitor and will continue her home medications.   -amlodipine 10mg  daily, losartan 25mg  daily, clonidine .1mg  BID  #GERD: Stable. No complaints. Continue home meds. -famotidine 20mg  daily  # VTE: heparin  # Diet: heart healthy  Code status: full  Dispo: Disposition is deferred at this time, awaiting improvement of current medical problems. Anticipated discharge in approximately 1 day(s).   The patient does have a current PCP Axel Filler, MD) and does not need an [facility] hospital follow-up appointment after discharge.  The patient does not have transportation limitations that hinder transportation to clinic appointments.  Signed: Rebecca Eaton, MD 02/27/2013, 5:43 PM

## 2013-02-27 NOTE — Progress Notes (Signed)
Pt taken to Room 6N 18 via w/c. Hilda Blades Moss Berry RN 02/27/13 4:40PM

## 2013-02-27 NOTE — Assessment & Plan Note (Signed)
Patient has history of chronic lower back pain due to spinal stenosis. Her back pain was usually controlled well with pain medication. Recently her back pain is getting worse. She refused admission in previous visit as suggested by Dr. Marinda Elk. Today she comes back with  severe back pain radiating to the left leg. Patient does not have urinary incontinence. She did not lose control of bowel moment, but patient reports having weakness in left leg when she walks and numbness in left leg. It is concerning that patient may have spinal cord compression. Before patient came into clinic, I spoke with Dr. Marinda Elk on the phone, who suggested to admit the patient to hospital if she still has severe back pain.  -will admit patient for pain control and neurosurgeon consult per Dr. Marinda Elk.

## 2013-02-27 NOTE — Progress Notes (Signed)
Patient ID: Monique Henderson, female   DOB: [birth year], 62 y.o.   MRN: [id number] Subjective:   Patient ID: Monique Henderson female   DOB: 1950/07/06 62 y.o.   MRN: [id number]  CC:  Acute visit due to back pain. HPI:  [name] is a 62 y.o. man with past medical history as outlined below, who present for an acute visit today.  Patient has chronic lower back pain. She had fusion surgery in the past. She was seen in clinic due to worsening back pain for 3 weeks on 02/22/13. Her back pain was not adequately relieved with hydrocodone/acetaminophen. Dr. Marinda Elk recommended to admit patient to hospital for pain control, imaging of her back, and neurosurgical consultation; however, she refused hospital admission in last visit.   She was discharged home from clionic on oxycodone-acetaminophen 7.5-325 mg one tablet every 6 hours as needed for pain and instructed to come back to hospital if her symptoms get worse. She had negative tests from previous visit, including CBC, CPR and ESR. Today, patient comes back with severe lower back pain radiating into left posterolateral buttock. Patient does not have urinary incontinence. She did not lose control of bowel moment. She has numbness in her left leg. She reports having weakness in her left leg when she walks.  ROS:  Denies fever, chills, cough, chest pain, SOB,  abdominal pain, diarrhea, constipation, dysuria, urgency, frequency, hematuria.    Past Medical History  Diagnosis Date  . GERD (gastroesophageal reflux disease)   . Hepatitis C   . Hypertension   . Low back pain   . Osteoarthritis   . Uterine fibroid   . Normocytic anemia     With thrombocytosis  . Right ureteral stone 2002  . Atelectasis 2002    Bilateral  . Bone spur 2008    Right calcaneal foot spur  . Lumbar spinal stenosis     S/P lumbar decompressive laminectomy, fusion and plating for lumbar spinal stensosis  . Breast cancer 2004    Ductal carcinoma in situ of the left  breast; S/P left partial mastectomy 02/26/2003; S/P re-excision of cranial and lateral margins11/18/2004.  Marland Kitchen Hot flashes   . Wears dentures     top  . Breast cancer 09/21/2012    right breast  . Hx of radiation therapy 2005    left breast  . 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 Autumn Messing; S/P re-excision of superior and inferior margins 10/27/2012.    Current Outpatient Prescriptions  Medication Sig Dispense Refill  . albuterol (PROAIR HFA) 108 (90 BASE) MCG/ACT inhaler Inhale 2 puffs into the lungs every 6 (six) hours as needed for wheezing.  1 Inhaler  1  . amLODipine (NORVASC) 10 MG tablet Take 10 mg by mouth daily.      Marland Kitchen aspirin 325 MG tablet Take 1 tablet (325 mg total) by mouth daily.  100 tablet  3  . B Complex Vitamins (VITAMIN B COMPLEX PO) Take 1 tablet by mouth daily.       . cetirizine (ZYRTEC) 10 MG tablet Take 1 tablet (10 mg total) by mouth daily.  30 tablet  11  . cloNIDine (CATAPRES) 0.1 MG tablet Take 0.1 mg by mouth 2 (two) times daily.      . famotidine (PEPCID) 20 MG tablet Take 1 tablet (20 mg total) by mouth at bedtime.  30 tablet  11  . losartan (COZAAR) 25 MG tablet  Take 1 tablet (25 mg total) by mouth daily.      . Multiple Vitamin (MULTIVITAMIN WITH MINERALS) TABS Take 1 tablet by mouth daily.      Marland Kitchen oxyCODONE-acetaminophen (PERCOCET) 7.5-325 MG per tablet Take 1 tablet by mouth every 6 (six) hours as needed for pain.  30 tablet  0  . traMADol (ULTRAM) 50 MG tablet Take 1 tablet (50 mg total) by mouth every 6 (six) hours as needed for pain.  120 tablet  2  . zolpidem (AMBIEN) 5 MG tablet Take 5 mg by mouth at bedtime as needed for sleep.      . [DISCONTINUED] lisinopril (PRINIVIL,ZESTRIL) 10 MG tablet Take 1 tablet (10 mg total) by mouth daily.  30 tablet  6   No current facility-administered medications for this visit.   Family History  Problem Relation Age of Onset  . Colon cancer Mother 55  .  Hypertension Mother   . Diabetes Sister 70  . Hypertension Sister   . Breast cancer Neg Hx   . Cervical cancer Neg Hx   . Diabetes Brother   . Hypertension Brother   . Diabetes Brother   . Hypertension Brother   . Kidney disease Son     On dialysis  . Hypertension Son   . Diabetes Son   . Multiple sclerosis Son   . Bone cancer Sister 74   History   Social History  . Marital Status: Single    Spouse Name: N/A    Number of Children: N/A  . Years of Education: N/A   Social History Main Topics  . Smoking status: Current Some Day Smoker -- 0.20 packs/day for 32 years    Types: Cigarettes  . Smokeless tobacco: Never Used  . Alcohol Use: 0.0 oz/week    1-2 Cans of beer per week     Comment: Occasional   . Drug Use: No  . Sexual Activity: None   Other Topics Concern  . None   Social History Narrative  . None   Review of Systems: Full 14-point review of systems otherwise negative. See HPI.  Objective:  Physical Exam: Filed Vitals:   02/27/13 1453  BP: 156/94  Pulse: 77  Temp: 97.6 F (36.4 C)  TempSrc: Oral  Weight: 131 lb 4.8 oz (59.557 kg)  SpO2: 99%   Constitutional: Patient is crying due to severe back pain.  HEENT: PERRL, EOMI, no scleral icterus, No JVD or bruit Cardiac: S1/S2, RRR, No murmurs, gallops or rubs Pulm: Good air movement bilaterally. Clear to auscultation bilaterally. No rales, wheezing, rhonchi or rubs. Abd: Soft, nondistended, nontender, no rebound pain, no organomegaly, BS present Ext: No edema. 2+DP/PT pulse bilaterally Musculoskeletal: severe tenderness over lower back at midline.  Skin: No rashes.  Neuro: Alert and oriented X3, cranial nerves II-XII grossly intact, muscle strength seems normal in all extremeties, sensation to light touch intact. Straight leg raise is negative on right; straight leg raise on left is positive.  Psych: Patient is not psychotic, no suicidal or hemocidal ideation.    Assessment & Plan:

## 2013-02-28 ENCOUNTER — Ambulatory Visit: Payer: Medicare Other

## 2013-02-28 DIAGNOSIS — G8929 Other chronic pain: Secondary | ICD-10-CM | POA: Diagnosis not present

## 2013-02-28 DIAGNOSIS — I10 Essential (primary) hypertension: Secondary | ICD-10-CM | POA: Diagnosis not present

## 2013-02-28 DIAGNOSIS — M48061 Spinal stenosis, lumbar region without neurogenic claudication: Secondary | ICD-10-CM | POA: Diagnosis not present

## 2013-02-28 DIAGNOSIS — M545 Low back pain, unspecified: Secondary | ICD-10-CM | POA: Diagnosis not present

## 2013-02-28 DIAGNOSIS — N289 Disorder of kidney and ureter, unspecified: Secondary | ICD-10-CM

## 2013-02-28 DIAGNOSIS — Z981 Arthrodesis status: Secondary | ICD-10-CM | POA: Diagnosis not present

## 2013-02-28 DIAGNOSIS — IMO0002 Reserved for concepts with insufficient information to code with codable children: Secondary | ICD-10-CM | POA: Diagnosis not present

## 2013-02-28 MED ORDER — WHITE PETROLATUM GEL
Status: AC
  Administered 2013-02-28: 0.2
  Filled 2013-02-28: qty 5

## 2013-02-28 MED ORDER — GABAPENTIN 300 MG PO CAPS
300.0000 mg | ORAL_CAPSULE | Freq: Three times a day (TID) | ORAL | Status: DC
  Administered 2013-02-28 (×3): 300 mg via ORAL
  Filled 2013-02-28 (×6): qty 1

## 2013-02-28 MED ORDER — POTASSIUM CHLORIDE CRYS ER 20 MEQ PO TBCR: 40.0000 meq | EXTENDED_RELEASE_TABLET | Freq: Two times a day (BID) | ORAL | Status: DC

## 2013-02-28 NOTE — Progress Notes (Signed)
Subjective: Patient reports that her pain control is relatively well controlled with morphine, pain relief lasts approx 2 hours. She was able to get intermittent sleep last night and was at times awoken by pain. Patient amenable to starting gabapentin. No new symptoms or acute events over night.   Objective: Vital signs in last 24 hours: Filed Vitals:   02/27/13 1650 02/28/13 0134 02/28/13 0527 02/28/13 1020  BP: 170/96 148/91 146/97 151/92  Pulse: 57 58 58 57  Temp: 98.4 F (36.9 C) 98 F (36.7 C) 98.2 F (36.8 C) 98.4 F (36.9 C)  TempSrc: Oral Oral Oral Oral  Resp: 18 18 16 16   Height: 5\' 4"  (1.626 m)     Weight: 58.741 kg (129 lb 8 oz)     SpO2: 100% 97% 100% 100%   Weight change:   Intake/Output Summary (Last 24 hours) at 02/28/13 1303 Last data filed at 02/28/13 0916  Gross per 24 hour  Intake    240 ml  Output      0 ml  Net    240 ml   Physical Exam General:patient was comfortably sitting upright in bed and eating breakfast until we walked in the room at which point she became visibly uncomfortable, though she is alert, cooperative HEENT: NCAT, vision grossly intact Neck: supple Lungs: clear to ascultation bilaterally, normal work of respiration, no wheezes, rales, ronchi Heart: regular rate and rhythm, no murmurs, gallops, or rubs Abdomen: soft, non-tender, non-distended, normal bowel sounds  Extremities: warm extremities bilaterally, no BLE edema  Neurologic: alert & oriented X3, cranial nerves II-XII intact, strength grossly intact, sensation intact to light touch  Lab Results: Basic Metabolic Panel:  Recent Labs Lab 02/22/13 1111 02/27/13 1819  NA 143 142  K 3.9 3.2*  CL 102 104  CO2 33* 28  GLUCOSE 115* 89  BUN 14 19  CREATININE 1.33* 1.32*  CALCIUM 9.8 8.9   Liver Function Tests:  Recent Labs Lab 02/22/13 1111 02/27/13 1819  AST 25 30  ALT 26 40*  ALKPHOS 76 69  BILITOT 0.3 0.1*  PROT 7.3 6.6  ALBUMIN 4.3 3.5   CBC:  Recent  Labs Lab 02/22/13 1111 02/27/13 1819  WBC 5.4 4.2  NEUTROABS 3.8  --   HGB 13.7 13.2  HCT 40.5 38.4  MCV 88.8 88.1  PLT 380 314   Urine Drug Screen: Drugs of Abuse     Component Value Date/Time   LABOPIA POSITIVE* 02/27/2013 2120   COCAINSCRNUR POSITIVE* 02/27/2013 2120   COCAINSCRNUR POS* 01/17/2009 2249   LABBENZ NONE DETECTED 02/27/2013 2120   LABBENZ NEG 01/17/2009 2249   AMPHETMU NONE DETECTED 02/27/2013 2120   AMPHETMU NEG 01/17/2009 2249   THCU NONE DETECTED 02/27/2013 2120   LABBARB NONE DETECTED 02/27/2013 2120    Studies/Results: No results found. Medications: I have reviewed the patient's current medications. Scheduled Meds: . amLODipine  10 mg Oral Daily  . aspirin  325 mg Oral Daily  . cloNIDine  0.1 mg Oral BID  . docusate sodium  100 mg Oral BID  . famotidine  20 mg Oral QHS  . gabapentin  300 mg Oral TID  . heparin  5,000 Units Subcutaneous Q8H  . losartan  25 mg Oral Daily  . multivitamin with minerals  1 tablet Oral Daily  . sodium chloride  3 mL Intravenous Q12H   Continuous Infusions:  PRN Meds:.sodium chloride, albuterol, diphenhydrAMINE, morphine injection, ondansetron (ZOFRAN) IV, ondansetron, sodium chloride, zolpidem  Assessment/Plan:  Ms. Gugliuzza is  a 62yo woman with PMH lumbar spinal stenosis s/p laminectomy and fusion in 2010, HTN, Hep C, GERD, bilateral breast CA (currently receiving radiation to R breast at [facility]) who presents with c/o worsening sharp lower back pain with radiation down L buttock into her lateral L leg x 3 weeks likely representing disc herniation w/ nerve root compression.   # Left lower back pain w/ radiculopathy: Patient with chronic low back pain, though worsening over the past three weeks prior to admission. This likely represents disc herniation w/ nerve root compression. However, given patient's intermittent nightsweats combined with her hx of breast cancer, vertebral metastasis cannot be ruled out. I believe this diagnosis is  unlikely given she had no point tenderness over her spine and that her night sweats have been going on for approx. 1 year and she had normal ESR/CRP and alk phos. Considering patient had L hip xray revealing degenerative changes last week, this may represent some OA of her L hip, though the radiation indicates a nerve root is involved as well so this likely does not explain patient's entire problem. Patient's work up is complicated by the fact that she has metal hardware in her back from her spinal fusion and therefore cannot receive an MRI. Will consult neurosurgery to obtain their recommendations for further management.  -morphine 1-2mg  q3h prn  -add gabapentin 300mg  TID today for further pain control -neurosurgery consult pending - recs on further imaging for patient -colace prn  -zofran prn  -benadryl prn  -UDS + cocaine and opiates during this admission - Rad onc at [facility] left a note reporting that pt has missed multiple radiation appointments and that they would like her to follow up with them as soon as possible, either in patient or outpatient  # HTN: BPs stable 140s-150s/90s overnight, likely elevated due to significant pain. Will continue to monitor and will continue her home medications.  -amlodipine 10mg  daily, losartan 25mg  daily, clonidine .1mg  BID   #Hypokalemia- was 3.2 overnight; supplement prn (s/p 1 dose Kdur 68mEq last night) -BMP in AM  #GERD: Stable. No complaints. Continue home meds.  -famotidine 20mg  daily   # VTE: heparin   # Diet: heart healthy   Code status: full  Dispo: Disposition is deferred at this time, awaiting improvement of current medical problems.  Anticipated discharge in approximately 1 day(s).   The patient does have a current PCP Axel Filler, MD) and does not need an [facility] hospital follow-up appointment after discharge.  The patient does not have transportation limitations that hinder transportation to clinic appointments.  .Services Needed  at time of discharge: Y = Yes, Blank = No PT:   OT:   RN:   Equipment:   Other:     LOS: 1 day   Rebecca Eaton, MD 02/28/2013, 1:03 PM

## 2013-02-28 NOTE — H&P (Signed)
  Date: 02/28/2013  Patient name: Monique Henderson  Medical record number: [id number]  Date of birth: March 16, 1951   I have seen and evaluated Monique Henderson and discussed their care with the Residency Team. Monique Henderson was admitted with uncontrolled LBP with L radiculopathy. She has breast cancer and is undergoing XRT although she has missed several sessions.  Assessment and Plan: I have seen and evaluated the patient as outlined above. I agree with the formulated Assessment and Plan as detailed in the residents' admission note, with the following changes:   1. LBP with L radiculopathy - Diff dx include a flare of her known spinal stenosis, nerve impingement, bony met (unlikely as nl sed rate and Alk phos), and cauda equina (unlikely as no other typical sxs). MRI could help to sort out these sxs but she cannot get an MRI. Will ask NS to eval pt to help with need for further imaging. For now, pain adequately controlled on MSO4. We will add gabapentin as adjunctive tx. PT/OT consult.  2. Breast cancer - Appreciate Dr Charlton Henderson note regarding her XRT. Once we have NS recs, then we can decide on dispo.   Bartholomew Crews, MD 9/30/201412:30 PM

## 2013-02-28 NOTE — Progress Notes (Signed)
Chaplain responded to consult from nurse. Patient was dozing but woke up when chaplain entered. Patient immediately began talking about her pain. She stated that she was having back problems and also issues with her legs, and that the pain was still not fully managed. She also complained of her difficulty sleeping. Chaplain practiced reflective listening and empathic presence. Prayer seemed like an important practice for the patient, so chaplain asked if she would like prayer. She said yes, and chaplain offered prayer for comfort, courage, and hope. At the end of chaplain's prayer, patient seemed to be in a prayer state of her own. When roused, she seemed extremely thankful for prayer and said that her pain had subsided a bit since chaplain entered room. Chaplain informed her that she could page a chaplain anytime.

## 2013-03-01 ENCOUNTER — Ambulatory Visit: Payer: Medicare Other

## 2013-03-01 ENCOUNTER — Observation Stay (HOSPITAL_COMMUNITY): Payer: Medicare Other

## 2013-03-01 DIAGNOSIS — M5126 Other intervertebral disc displacement, lumbar region: Secondary | ICD-10-CM | POA: Diagnosis not present

## 2013-03-01 DIAGNOSIS — N289 Disorder of kidney and ureter, unspecified: Secondary | ICD-10-CM | POA: Diagnosis not present

## 2013-03-01 DIAGNOSIS — M5137 Other intervertebral disc degeneration, lumbosacral region: Secondary | ICD-10-CM | POA: Diagnosis not present

## 2013-03-01 DIAGNOSIS — M545 Low back pain, unspecified: Secondary | ICD-10-CM

## 2013-03-01 DIAGNOSIS — M999 Biomechanical lesion, unspecified: Secondary | ICD-10-CM | POA: Diagnosis not present

## 2013-03-01 DIAGNOSIS — Z853 Personal history of malignant neoplasm of breast: Secondary | ICD-10-CM

## 2013-03-01 DIAGNOSIS — IMO0001 Reserved for inherently not codable concepts without codable children: Secondary | ICD-10-CM | POA: Diagnosis not present

## 2013-03-01 DIAGNOSIS — IMO0002 Reserved for concepts with insufficient information to code with codable children: Secondary | ICD-10-CM | POA: Diagnosis not present

## 2013-03-01 DIAGNOSIS — I10 Essential (primary) hypertension: Secondary | ICD-10-CM

## 2013-03-01 DIAGNOSIS — M48061 Spinal stenosis, lumbar region without neurogenic claudication: Secondary | ICD-10-CM

## 2013-03-01 DIAGNOSIS — Z981 Arthrodesis status: Secondary | ICD-10-CM | POA: Diagnosis not present

## 2013-03-01 DIAGNOSIS — G8929 Other chronic pain: Secondary | ICD-10-CM | POA: Diagnosis not present

## 2013-03-01 LAB — BASIC METABOLIC PANEL
BUN: 17 mg/dL (ref 6–23)
CO2: 30 mEq/L (ref 19–32)
Calcium: 9 mg/dL (ref 8.4–10.5)
Calcium: 9.5 mg/dL (ref 8.4–10.5)
Chloride: 112 mEq/L (ref 96–112)
Chloride: 104 mEq/L (ref 96–112)
Creatinine, Ser: 1.12 mg/dL — ABNORMAL HIGH (ref 0.50–1.10)
Creatinine, Ser: 1.07 mg/dL (ref 0.50–1.10)
GFR calc Af Amer: 61 mL/min — ABNORMAL LOW (ref >90)
GFR calc non Af Amer: 55 mL/min — ABNORMAL LOW (ref >90)
Glucose, Bld: 97 mg/dL (ref 70–99)
Sodium: 142 mEq/L (ref 135–145)

## 2013-03-01 MED ORDER — OXYCODONE-ACETAMINOPHEN 7.5-325 MG PO TABS: 1.0000 | ORAL_TABLET | Freq: Four times a day (QID) | ORAL | Status: DC | PRN

## 2013-03-01 MED ORDER — GABAPENTIN 100 MG PO CAPS
100.0000 mg | ORAL_CAPSULE | Freq: Three times a day (TID) | ORAL | Status: DC
  Administered 2013-03-01: 100 mg via ORAL
  Filled 2013-03-01: qty 1

## 2013-03-01 MED ORDER — GABAPENTIN 100 MG PO CAPS: 100.0000 mg | ORAL_CAPSULE | Freq: Three times a day (TID) | ORAL | Status: DC

## 2013-03-01 NOTE — Progress Notes (Signed)
Subjective: Patient with continued pain, though seems to be responding well to morphine. No acute events overnight and no other change to her symptoms. I spoke with both radiology and on call neurosurgeon, both of whom state lumbar MRI would be test of choice and are safe in this patient.   Objective: Vital signs in last 24 hours: Filed Vitals:   02/28/13 2349 03/01/13 0030 03/01/13 0713 03/01/13 0730  BP:  151/86 155/103 152/86  Pulse: 67  77   Temp: 99.5 F (37.5 C)  98.6 F (37 C)   TempSrc: Oral  Oral   Resp: 16  16   Height:      Weight:      SpO2: 100%  95%    Weight change:   Intake/Output Summary (Last 24 hours) at 03/01/13 1339 Last data filed at 03/01/13 0854  Gross per 24 hour  Intake    760 ml  Output      0 ml  Net    760 ml   Physical Exam General:patient tearful and agitated on my exam today 2/2 pain HEENT: NCAT, vision grossly intact Neck: supple Lungs: clear to ascultation bilaterally, normal work of respiration, no wheezes, rales, ronchi Heart: regular rate and rhythm, no murmurs, gallops, or rubs Abdomen: soft, non-tender, non-distended, normal bowel sounds  Extremities: warm extremities bilaterally, no BLE edema  Neurologic: alert & oriented X 3, cranial nerves II-XII grossly intact, strength grossly intact, sensation intact to light touch; gait normal  Lab Results: Basic Metabolic Panel:  Recent Labs Lab 03/01/13 0520 03/01/13 1055  NA 149* 142  K 4.1 3.7  CL 112 104  CO2 27 30  GLUCOSE 101* 97  BUN 17 17  CREATININE 1.12* 1.07  CALCIUM 9.0 9.5   Liver Function Tests:  Recent Labs Lab 02/27/13 1819  AST 30  ALT 40*  ALKPHOS 69  BILITOT 0.1*  PROT 6.6  ALBUMIN 3.5   CBC:  Recent Labs Lab 02/27/13 1819  WBC 4.2  HGB 13.2  HCT 38.4  MCV 88.1  PLT 314   Urine Drug Screen: Drugs of Abuse     Component Value Date/Time   LABOPIA POSITIVE* 02/27/2013 2120   COCAINSCRNUR POSITIVE* 02/27/2013 2120   COCAINSCRNUR POS*  01/17/2009 2249   LABBENZ NONE DETECTED 02/27/2013 2120   LABBENZ NEG 01/17/2009 2249   AMPHETMU NONE DETECTED 02/27/2013 2120   AMPHETMU NEG 01/17/2009 2249   THCU NONE DETECTED 02/27/2013 2120   LABBARB NONE DETECTED 02/27/2013 2120    Studies/Results: No results found. Medications: I have reviewed the patient's current medications. Scheduled Meds: . amLODipine  10 mg Oral Daily  . aspirin  325 mg Oral Daily  . cloNIDine  0.1 mg Oral BID  . docusate sodium  100 mg Oral BID  . famotidine  20 mg Oral QHS  . gabapentin  100 mg Oral TID  . heparin  5,000 Units Subcutaneous Q8H  . losartan  25 mg Oral Daily  . multivitamin with minerals  1 tablet Oral Daily  . sodium chloride  3 mL Intravenous Q12H   Continuous Infusions:  PRN Meds:.sodium chloride, albuterol, diphenhydrAMINE, morphine injection, ondansetron (ZOFRAN) IV, ondansetron, sodium chloride, zolpidem  Assessment/Plan:  Ms. Oxley is a 62yo woman with PMH lumbar spinal stenosis s/p laminectomy and fusion in 2010, HTN, Hep C, GERD, bilateral breast CA (currently receiving radiation to R breast at [facility]) who presents with c/o worsening sharp lower back pain with radiation down L buttock into her lateral L  leg x 3 weeks likely representing disc herniation w/ nerve root compression.   # Left lower back pain w/ radiculopathy: Patient with chronic low back pain, though worsening over the past three weeks prior to admission. This likely represents disc herniation w/ nerve root compression. However, given patient's intermittent nightsweats combined with her hx of breast cancer, vertebral metastasis cannot be definitively ruled out. I believe this diagnosis is very unlikely given she had no point tenderness over her spine and that her night sweats have been going on for approx. 1 year and she had normal ESR/CRP and alk phos. Considering patient had L hip xray revealing degenerative changes last week, this may represent some OA of her L hip, though  the radiation indicates a nerve root is involved as well so this likely does not explain patient's entire problem. I spoke with neurosurgery and radiology and both parties recommended lumbar MRI for further imaging and report that it is safe with patient's previous hardware.  -morphine 1-2mg  q3h prn -->switch to percocet 5-325 at discharge -decreased gabapentin to 100mg  TID today given 300mg  TID made patient drowsy -patient has f/u with Dr. Ronnald Ramp, neurosurgery, on Oct 14th -colace prn  -zofran prn  -benadryl prn  -UDS + cocaine and opiates during this admission - Rad onc at [facility] left a note reporting that pt has missed multiple radiation appointments and that they would like her to follow up with them as soon as possible, either in patient or outpatient  # HTN: BPs 150s/80s-90s overnight, likely elevated due to significant pain. Will continue to monitor and will continue her home medications. May need regimen altered slightly as an outpatient given she has had consistently elevated BPs. -amlodipine 10mg  daily, losartan 25mg  daily, clonidine .1mg  BID   #Hypokalemia- was 3.2 on admission, which was supplemented with 1 dose Kdur 78mEq last night. K normalized today at 3.7.  #GERD: Stable. No complaints. Continue home meds.  -famotidine 20mg  daily   # VTE: heparin   # Diet: heart healthy   Code status: full  Dispo: Discharge today.   The patient does have a current PCP Axel Filler, MD) and does not need an [facility] hospital follow-up appointment after discharge.  The patient does not have transportation limitations that hinder transportation to clinic appointments.  .Services Needed at time of discharge: Y = Yes, Blank = No PT:   OT:   RN:   Equipment:   Other:     LOS: 2 days   Rebecca Eaton, MD 03/01/2013, 1:39 PM

## 2013-03-01 NOTE — Progress Notes (Signed)
  Date: 03/01/2013  Patient name: Monique Henderson  Medical record number: [id number]  Date of birth: [birth year]   This patient has been seen and the plan of care was discussed with the house staff. Please see their note for complete details. I concur with their findings with the following additions/corrections: Patient is able to ambulate without difficulty. She describes pain in her lateral left leg "like pins and needles".  She has no bladder/bowel incontinence or retention and no saddle anesthesia. No recent trauma/falls.  She has a known history of lumbar stenosis. Confirmed with radiology and neurosurgery that MRI L spine is safe in this patient.  Pending read.  Would decrease gabapentin to 100 mg po tid and increase slowly as she tolerates. Would have her follow up with her PCP regarding narcotic use, for now would give 3 days of percocet only until follow up.  She has a substance abuse problem as noted by +cocaine and narcotic therapy is not advisable long term unless she stops abusing illicit drugs. Regardless, she would be better served by addiction specialist/pain management. Medically stable for D/C home today.  Dominic Pea, DO, [facility] 03/01/2013, 2:22 PM

## 2013-03-01 NOTE — Discharge Summary (Signed)
Patient given discharge instructions and prescription. Escorted via wheelchair by tech without incident. Tonita Cong, RN

## 2013-03-01 NOTE — Progress Notes (Signed)
Case discussed with Dr. Blaine Hamper at the time of the visit.  We reviewed the resident's history and exam and pertinent patient test results.  I agree with the assessment, diagnosis, and plan of care documented in the resident's note.

## 2013-03-01 NOTE — Discharge Summary (Signed)
Name: Monique Henderson MRN: [id number] DOB: 01-23-1951 62 y.o. PCP: Axel Filler, MD  Date of Admission: 02/27/2013  4:47 PM Date of Discharge: 03/01/2013 Attending Physician: Dr. Murlean Caller  Discharge Diagnosis: Principal Problem:   LOW BACK PAIN- w/ left sided radiculopathy s/p laminectomy and lumbar fusion in 2010 Active Problems:   HYPERTENSION   RENAL INSUFFICIENCY   SPINAL STENOSIS, LUMBAR  Discharge Medications:   Medication List    STOP taking these medications       ibuprofen 200 MG tablet  Commonly known as:  ADVIL,MOTRIN      TAKE these medications       albuterol 108 (90 BASE) MCG/ACT inhaler  Commonly known as:  PROAIR HFA  Inhale 2 puffs into the lungs every 6 (six) hours as needed for wheezing.     amLODipine 10 MG tablet  Commonly known as:  NORVASC  Take 10 mg by mouth daily.     aspirin 325 MG tablet  Take 1 tablet (325 mg total) by mouth daily.     cetirizine 10 MG tablet  Commonly known as:  ZYRTEC  Take 1 tablet (10 mg total) by mouth daily.     cloNIDine 0.1 MG tablet  Commonly known as:  CATAPRES  Take 0.1 mg by mouth 2 (two) times daily.     famotidine 20 MG tablet  Commonly known as:  PEPCID  Take 1 tablet (20 mg total) by mouth at bedtime.     gabapentin 100 MG capsule  Commonly known as:  NEURONTIN  Take 1 capsule (100 mg total) by mouth 3 (three) times daily.     losartan 25 MG tablet  Commonly known as:  COZAAR  Take 1 tablet (25 mg total) by mouth daily.     multivitamin with minerals Tabs tablet  Take 1 tablet by mouth daily.     oxyCODONE-acetaminophen 7.5-325 MG per tablet  Commonly known as:  PERCOCET  Take 1-2 tablets by mouth every 6 (six) hours as needed for pain.     VITAMIN B COMPLEX PO  Take 1 tablet by mouth daily.     zolpidem 5 MG tablet  Commonly known as:  AMBIEN  Take 5 mg by mouth at bedtime.       Disposition and follow-up:   Monique Henderson was discharged from [facility] in Stable condition.  At the hospital follow up visit please address:  1.  Back pain w/ radiculopathy-Please order lumbar MRI as neurosurgery and radiology deem it safe in this patient. This would have been completed as an inpatient, but we were unable to get it done and it was holding up her discharge. She will follow up with neurosurgery on Oct 14th.  2.  Bilateral breast cancer- Patient receiving radiation at [facility] to her R breast currently. Rad onc dropped a note in EMR while she was inpatient that pt has missed multiple radiation appointments and that they need her to follow up ASAP for radiation therapy. Please make sure patient follows up with Rad onc.   2.  Labs / imaging needed at time of follow-up: none  3.  Pending labs/ test needing follow-up: MRI  Follow-up Appointments: Follow-up Information   Follow up with Elnora Morrison, MD On 03/08/2013. (8:45am)    Specialty:  Internal Medicine   Contact information:   [place] [address] [phone]       Follow up with Eustace Moore, MD On 03/14/2013. (2:30pm)  Specialty:  Neurosurgery   Contact information:   [address] [place] [address] [phone]       Discharge Instructions: Discharge Orders   Future Appointments Provider Department Dept Phone   03/02/2013 9:15 AM [facility] [place] [id number]   03/03/2013 9:15 AM [facility] [place] [facility] [id number]   03/06/2013 9:45 AM [facility] [place] RADIATION ONCOLOGY [id number]   03/07/2013 8:55 AM [facility] [place] RAD[facility] ONCOL[facility] [id number]   03/08/2013 8:45 AM Hester Mates, MD [place] [phone]   03/08/2013 9:55 AM [facility] [place] ONCOLOGY [id number]   03/09/2013 9:55 AM [facility] [place] RADIATION ONCOLOGY [phone]   Future Orders Complete By Expires   Call MD for:  severe uncontrolled pain  As directed    Diet - low sodium heart healthy  As directed    Increase activity slowly  As directed       Consultations: Treatment Team:  Eustace Moore, MDnone  Procedures Performed:  Dg Hip Complete Left  02/15/2013   CLINICAL DATA:  Left hip pain. No injury. Breast cancer  EXAM: LEFT HIP - COMPLETE 2+ VIEW  COMPARISON:  None.  FINDINGS: Mild joint space narrowing left hip. Negative for fracture or mass. Negative for AVN.  Spinous process fusion device at L4-5.  IMPRESSION: Mild degenerative changes left hip joint. No acute abnormality.   Electronically Signed   By: Franchot Gallo M.D.   On: 02/15/2013 12:42   Dg Femur Left  02/15/2013   CLINICAL DATA:  Left hip pain. No injury. Breast cancer  EXAM: LEFT FEMUR - 2 VIEW  COMPARISON:  None.  FINDINGS: Negative for fracture or mass. Negative for metastatic disease.  IMPRESSION: Negative.   Electronically Signed   By: Franchot Gallo M.D.   On: 02/15/2013 12:43    MRI- lumbar spine 03/01/2013 IMPRESSION:  1. Progressive L2-L3 severe degenerative disc disease with new sequestered fragment dorsal to the L3 vertebra measuring 11 mm x 10 mm x 11 mm. This fills the lateral recess and compresses the descending left L3 nerve. There is moderate central stenosis at and below the level of the disc.  2. Postoperative changes of L4-L5 with spinous process clamp. Unchanged moderate left foraminal stenosis.  3. Other levels appear similar to the prior CT myelogram of 04/22/2011. Findings discussed with the patient's clinical team at  1445 hr.  Admission HPI:  Monique Henderson is a 62yo woman with PMH lumbar spinal stenosis s/p laminectomy and fusion in 2010 by Dr. Ronnald Ramp (neurosurgery), HTN, Hep C, GERD, bilateral breast CA (currently receiving radiation to R breast at [facility]) who presents with c/o worsening sharp lower back pain with radiation  down L buttock into her lateral L leg x 3 weeks, pain now 10/10. Patient has spinal stenosis with chronic low back pain, but reports the current episode has been worsening for a few weeks now, unresponsive to tramadol (which previously controlled her pain well) and percocet. Pain is worsened by prolonged sitting, improved by "nothing" and has been inhibiting her sleep. Pt was given a cortisone injection to her L hip 3 weeks ago, but this did not help her pain, instead it made it worse. She is followed by Dr. Marinda Elk in clinic and she was seen last week (9/24) at which time her pain was obviously worse than her baseline and Dr.  Joines had recommended admission for pain management and neurosurgery consult, though patient refused admission. Instead, she was sent home with 1 week supply of percocet and agreed to f/u with neruosurgery. Pt returned to clinic today for continued pain. She has run out of her percocet and was not seen by neurosurgery in the interim. Given her uncontrolled pain, pt was admitted for pain control and neurosurgery consultation. Denies saddle paresthesia, bowel or bladder incontinence, bilateral leg weakness, F/C. She does endorse feeling as though her L leg is heavier than her R when she walks and may be weaker than her R leg. Additionally, there is some numbness/tingling to L lateral leg. She endorses having some nightsweats intermittently over the past year, but this is not new. Patient has been referred to multiple pain clinics in the past, but given her remote drug use, pt has been declined from all.   Pt had L hip xray done 02/22/13 that showed mild degenerative changes to L hip as well as a L femur xray that was negative. Pt is unable to receive MRI given she has metal hardware from prevous back surgery. During clinic last week she had a BMP significant for Cr. 1.33 (baseline), bicarb 33 (baseline), CBC wnl, CRP wnl, ESR wnl.  Pt has appointment with Dr. Ronnald Ramp on 03/14/2013 at  2:30pm.   Hospital Course by problem list:    # Left lower back pain w/ radiculopathy: Patient with chronic low back pain with L leg radiculopathy that had worsened over three weeks prior to admission. Patient was admitted for pain control. She received a lumbar spine MRI that showed sequestered fragment dorsal to the L3 vertebra that fills the lateral recess and compresses the descending left L3 nerve. This lesion is consistent with patient's pain and paresthesia she is experiencing on L lateral leg. Patient did not exhibit red flag signs of cauda equina during her stay. She does have h/o breast cancer, so metastasis was considered, though alk phos, ESR, and CRP all wnl with definitive exclusion of this process by MRI results. 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 I spoke with neurosurgery regarding this patient. They said that the patient could choose to have surgery this week to remove the fragment, or she could choose to follow up as an outpatient as previously scheduled on Oct 14th. Patient chose the latter and will f/u with neurosurgery as scheduled. Patient's pain was managed with morphine 1-2mg  q3h prn and switched to percocet on discharge. She was discharged with only #30 of percocet with f/u in clinic next week as she does have h/o of drug use (UDS + cocaine this admission) and I did not feel comfortable distributing more than this. If she has need for more pain medications next week, that can be considered. Would suggest only giving her enough to get through to her neurosurgery appointment.   # HTN: BPs elevated slightly since her admission. On night prior to discharge, BPs ranged 150s/80s-90s. Thought to be due to significant pain. We continued her home antihypertensive regimen of amlodipine 10mg  daily, losartan 25mg  daily, clonidine .1mg  BID. May need regimen altered slightly as an outpatient if her BP remains elevated.    #Hypokalemia- Potassium was 3.2 on admission, which was supplemented with 1 dose Kdur 12mEq. K normalized prior to discharge and was 3.7.   #GERD: Stable. No complaints. Continued home famotidine 20mg  daily.  Discharge Vitals:   BP 152/86  Pulse 77  Temp(Src) 98.6 F (37 C) (Oral)  Resp 16  Ht 5\' 4"  (1.626 m)  Wt 58.741 kg (129 lb 8 oz)  BMI 22.22 kg/m2  SpO2 95%  Discharge Labs:  Results for orders placed during the hospital encounter of 02/27/13 (from the past 24 hour(s))  BASIC METABOLIC PANEL     Status: Abnormal   Collection Time    03/01/13  5:20 AM      Result Value Range   Sodium 149 (*) 135 - 145 mEq/L   Potassium 4.1  3.5 - 5.1 mEq/L   Chloride 112  96 - 112 mEq/L   CO2 27  19 - 32 mEq/L   Glucose, Bld 101 (*) 70 - 99 mg/dL   BUN 17  6 - 23 mg/dL   Creatinine, Ser 1.12 (*) 0.50 - 1.10 mg/dL   Calcium 9.0  8.4 - 10.5 mg/dL   GFR calc non Af Amer 52 (*) >90 mL/min   GFR calc Af Amer 61 (*) >90 mL/min  BASIC METABOLIC PANEL     Status: Abnormal   Collection Time    03/01/13 10:55 AM      Result Value Range   Sodium 142  135 - 145 mEq/L   Potassium 3.7  3.5 - 5.1 mEq/L   Chloride 104  96 - 112 mEq/L   CO2 30  19 - 32 mEq/L   Glucose, Bld 97  70 - 99 mg/dL   BUN 17  6 - 23 mg/dL   Creatinine, Ser 1.07  0.50 - 1.10 mg/dL   Calcium 9.5  8.4 - 10.5 mg/dL   GFR calc non Af Amer 55 (*) >90 mL/min   GFR calc Af Amer 64 (*) >90 mL/min    Signed: Rebecca Eaton, MD 03/01/2013, 4:36 PM   Time Spent on Discharge: 35 minutes Services Ordered on Discharge: none Equipment Ordered on Discharge: none

## 2013-03-02 ENCOUNTER — Telehealth: Payer: Self-pay | Admitting: Radiation Oncology

## 2013-03-02 ENCOUNTER — Telehealth: Payer: Self-pay | Admitting: *Deleted

## 2013-03-02 ENCOUNTER — Ambulatory Visit: Payer: Medicare Other

## 2013-03-02 NOTE — Telephone Encounter (Signed)
Phoned patient's residence this morning to verify if she plans to present for radiation treatment at 0915. No answer. Left message requesting return call. Notified Kristy, RT on linac 2.

## 2013-03-02 NOTE — Telephone Encounter (Signed)
Monique Henderson has been discharged.  I called her at 1800 and she stated she still has pain and is not any better than when she was admitted. Apparently she has a "pinched nerve."  She plans to see a chiropractor  tomorrow and states she will resume treatment on Monday

## 2013-03-03 ENCOUNTER — Ambulatory Visit: Payer: Medicare Other

## 2013-03-03 NOTE — Discharge Summary (Signed)
  Date: 03/03/2013  Patient name: Monique Henderson  Medical record number: [id number]  Date of birth: 04-08-1951   This patient has been seen and the plan of care was discussed with the house staff. Please see their note for complete details. I concur with their findings and plan.  Dominic Pea, DO, [facility] 03/03/2013, 3:36 PM

## 2013-03-05 ENCOUNTER — Other Ambulatory Visit: Payer: Self-pay | Admitting: Internal Medicine

## 2013-03-06 ENCOUNTER — Other Ambulatory Visit: Payer: Self-pay | Admitting: Internal Medicine

## 2013-03-06 ENCOUNTER — Ambulatory Visit
Admission: RE | Admit: 2013-03-06 | Discharge: 2013-03-06 | Disposition: A | Discharge status: Home / Self Care | Payer: Medicare Other | Source: Ambulatory Visit | Attending: Radiation Oncology | Admitting: Radiation Oncology

## 2013-03-06 DIAGNOSIS — L589 Radiodermatitis, unspecified: Secondary | ICD-10-CM | POA: Diagnosis not present

## 2013-03-06 DIAGNOSIS — C50919 Malignant neoplasm of unspecified site of unspecified female breast: Secondary | ICD-10-CM | POA: Diagnosis not present

## 2013-03-06 DIAGNOSIS — IMO0002 Reserved for concepts with insufficient information to code with codable children: Secondary | ICD-10-CM | POA: Diagnosis not present

## 2013-03-06 DIAGNOSIS — M5430 Sciatica, unspecified side: Secondary | ICD-10-CM | POA: Diagnosis not present

## 2013-03-06 DIAGNOSIS — Z510 Encounter for antineoplastic radiation therapy: Secondary | ICD-10-CM | POA: Diagnosis not present

## 2013-03-06 NOTE — Telephone Encounter (Signed)
Zolpidem rx called to [place].

## 2013-03-06 NOTE — Telephone Encounter (Signed)
Refill approved - nurse to call in. 

## 2013-03-07 ENCOUNTER — Encounter (HOSPITAL_COMMUNITY): Payer: Self-pay | Admitting: *Deleted

## 2013-03-07 ENCOUNTER — Ambulatory Visit
Admission: RE | Admit: 2013-03-07 | Discharge: 2013-03-07 | Disposition: A | Discharge status: Home / Self Care | Payer: Medicare Other | Source: Ambulatory Visit | Attending: Radiation Oncology | Admitting: Radiation Oncology

## 2013-03-07 ENCOUNTER — Emergency Department (HOSPITAL_COMMUNITY)
Admission: EM | Admit: 2013-03-07 | Discharge: 2013-03-07 | Disposition: A | Discharge status: Home / Self Care | Payer: Medicare Other | Attending: Emergency Medicine | Admitting: Emergency Medicine

## 2013-03-07 DIAGNOSIS — Z7982 Long term (current) use of aspirin: Secondary | ICD-10-CM | POA: Insufficient documentation

## 2013-03-07 DIAGNOSIS — Z862 Personal history of diseases of the blood and blood-forming organs and certain disorders involving the immune mechanism: Secondary | ICD-10-CM | POA: Diagnosis not present

## 2013-03-07 DIAGNOSIS — Z9889 Other specified postprocedural states: Secondary | ICD-10-CM | POA: Insufficient documentation

## 2013-03-07 DIAGNOSIS — Z79899 Other long term (current) drug therapy: Secondary | ICD-10-CM | POA: Diagnosis not present

## 2013-03-07 DIAGNOSIS — F17200 Nicotine dependence, unspecified, uncomplicated: Secondary | ICD-10-CM | POA: Insufficient documentation

## 2013-03-07 DIAGNOSIS — M545 Low back pain, unspecified: Secondary | ICD-10-CM | POA: Insufficient documentation

## 2013-03-07 DIAGNOSIS — Z87442 Personal history of urinary calculi: Secondary | ICD-10-CM | POA: Diagnosis not present

## 2013-03-07 DIAGNOSIS — G8929 Other chronic pain: Secondary | ICD-10-CM | POA: Insufficient documentation

## 2013-03-07 DIAGNOSIS — I10 Essential (primary) hypertension: Secondary | ICD-10-CM | POA: Diagnosis not present

## 2013-03-07 DIAGNOSIS — Z9119 Patient's noncompliance with other medical treatment and regimen: Secondary | ICD-10-CM | POA: Insufficient documentation

## 2013-03-07 DIAGNOSIS — Z98811 Dental restoration status: Secondary | ICD-10-CM | POA: Diagnosis not present

## 2013-03-07 DIAGNOSIS — Z853 Personal history of malignant neoplasm of breast: Secondary | ICD-10-CM | POA: Diagnosis not present

## 2013-03-07 DIAGNOSIS — R52 Pain, unspecified: Secondary | ICD-10-CM | POA: Diagnosis not present

## 2013-03-07 DIAGNOSIS — Z8542 Personal history of malignant neoplasm of other parts of uterus: Secondary | ICD-10-CM | POA: Insufficient documentation

## 2013-03-07 DIAGNOSIS — K219 Gastro-esophageal reflux disease without esophagitis: Secondary | ICD-10-CM | POA: Insufficient documentation

## 2013-03-07 DIAGNOSIS — IMO0002 Reserved for concepts with insufficient information to code with codable children: Secondary | ICD-10-CM | POA: Diagnosis not present

## 2013-03-07 DIAGNOSIS — Z8619 Personal history of other infectious and parasitic diseases: Secondary | ICD-10-CM | POA: Insufficient documentation

## 2013-03-07 DIAGNOSIS — Z91199 Patient's noncompliance with other medical treatment and regimen due to unspecified reason: Secondary | ICD-10-CM | POA: Insufficient documentation

## 2013-03-07 DIAGNOSIS — Z923 Personal history of irradiation: Secondary | ICD-10-CM | POA: Insufficient documentation

## 2013-03-07 DIAGNOSIS — J45901 Unspecified asthma with (acute) exacerbation: Secondary | ICD-10-CM | POA: Diagnosis not present

## 2013-03-07 MED ORDER — ONDANSETRON 4 MG PO TBDP
4.0000 mg | ORAL_TABLET | Freq: Once | ORAL | Status: AC
  Administered 2013-03-07: 4 mg via ORAL
  Filled 2013-03-07: qty 1

## 2013-03-07 MED ORDER — MORPHINE SULFATE 4 MG/ML IJ SOLN
6.0000 mg | Freq: Once | INTRAMUSCULAR | Status: AC
  Administered 2013-03-07: 6 mg via INTRAMUSCULAR
  Filled 2013-03-07: qty 2

## 2013-03-07 NOTE — Progress Notes (Signed)
Called to linac 2 to see pt, RT states "pt is crying". When this RN arrived pt was in dressing room sitting on bench. She was crying and writhing, c/o pain in her left leg and hip. She stated she had taken pain medication this morning "but it didn't help". Pt continued to cry and move from side to side on bench holding onto her left leg at times. Pt stated she was alone today. At pt's request pt taken to ED in wheelchair per Dulce Sellar, transporter.  Per pt's request this RN called Retta Diones, pt's sister and informed her of pt's status this morning. Ms Hassell Done verbalized understanding. Dr Valere Dross verbally informed of above information.

## 2013-03-07 NOTE — ED Notes (Signed)
Pt sent from cancer center with c/o left leg pain.Pt sts pain for about a month got much worse today. Pt is crying reports pain starts in left hip and radiates down her leg. Also pt sts tingling and numbness in left leg. Pt has hx of breast cancer and is undergoing radiation treatments. Pt was admitted to hospital recently for same complaint.

## 2013-03-07 NOTE — ED Provider Notes (Signed)
CSN: [id number]     Arrival date & time 03/07/13  0915 History   First MD Initiated Contact with Patient 03/07/13 0932     Chief Complaint  Patient presents with  . Leg Pain   (Consider location/radiation/quality/duration/timing/severity/associated sxs/prior Treatment) HPI  Monique Henderson is a 61 y.o. female with PMH of bilateral breast cancer (sent from radiation oncology this AM), spinal stenosis, Hep C, substance abuse and medical non-compliance complaining of exacerbation of chronic left sided lumbar radiculopathy (s/p laminectomy and lumbar fusion in 2010 by Dr. Ronnald Ramp) Pain is rated at 10/10 exacerbated by standing and stilting, alleviated by nothing, little relief with 7.5mg  percocet and gabapentin. Pt denies numbness, but endorses a left sided stinging paraesthesia past the knee.    Past Medical History  Diagnosis Date  . GERD (gastroesophageal reflux disease)   . Hepatitis C   . Hypertension   . Low back pain   . Uterine fibroid   . Normocytic anemia     With thrombocytosis  . Right ureteral stone 2002  . Atelectasis 2002    Bilateral  . Bone spur 2008    Right calcaneal foot spur  . Lumbar spinal stenosis     S/P lumbar decompressive laminectomy, fusion and plating for lumbar spinal stensosis  . Breast cancer 2004    Ductal carcinoma in situ of the left breast; S/P left partial mastectomy 02/26/2003; S/P re-excision of cranial and lateral margins11/18/2004.  Marland Kitchen Hot flashes   . Wears dentures     top  . Breast cancer 09/21/2012    right breast  . Hx of radiation therapy 2005    left breast  . 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 Autumn Messing; S/P re-excision of superior and inferior margins 10/27/2012.   . Osteoarthritis    Past Surgical History  Procedure Laterality Date  . Laminectomy  05/27/2009    Lumbar decompressive laminectomy, fusion and plating for lumbar spinal stensosis  . Breast lumpectomy  Left 2011  . Mastectomy, partial Left 02/26/2003    S/P left partial mastectomy 02/26/2003; S/P re-excision of cranial and lateral margins11/18/2004.   . Breast lumpectomy with needle localization and axillary sentinel lymph node bx Right 10/13/2012    Procedure: BREAST LUMPECTOMY WITH NEEDLE LOCALIZATION;  Surgeon: [name], MD;  Location: [facility];  Service: General;  Laterality: Right;  Right breast wire localized lumpectomy  . Re-excision of breast cancer,superior margins Right 10/27/2012    Procedure: RE-EXCISION OF BREAST CANCER,SUPERIOR and inferior MARGINS;  Surgeon: [name], MD;  Location: [facility];  Service: General;  Laterality: Right;  . Back surgery     Family History  Problem Relation Age of Onset  . Colon cancer Mother 107  . Hypertension Mother   . Diabetes Sister 97  . Hypertension Sister   . Breast cancer Neg Hx   . Cervical cancer Neg Hx   . Diabetes Brother   . Hypertension Brother   . Diabetes Brother   . Hypertension Brother   . Kidney disease Son     On dialysis  . Hypertension Son   . Diabetes Son   . Multiple sclerosis Son   . Bone cancer Sister 16   History  Substance Use Topics  . Smoking status: Current Some Day Smoker -- 0.20 packs/day for 34 years    Types: Cigarettes  . Smokeless tobacco: Never Used  . Alcohol Use:  1.2 oz/week    2 Cans of beer per week     Comment: 02/27/2013 "couple beers q weekend"   OB History   Grav Para Term Preterm Abortions TAB SAB Ect Mult Living   2 2 1 1            Review of Systems 10 systems reviewed and found to be negative, except as noted in the HPI  Allergies  Shrimp; Lisinopril; and Vicodin  Home Medications   Current Outpatient Rx  Name  Route  Sig  Dispense  Refill  . albuterol (PROAIR HFA) 108 (90 BASE) MCG/ACT inhaler   Inhalation   Inhale 2 puffs into the lungs every 6 (six) hours as needed for wheezing.   1 Inhaler   1   . amLODipine (NORVASC) 10 MG tablet   Oral    Take 10 mg by mouth daily.         Marland Kitchen aspirin 325 MG tablet   Oral   Take 1 tablet (325 mg total) by mouth daily.   100 tablet   3   . B Complex Vitamins (VITAMIN B COMPLEX PO)   Oral   Take 1 tablet by mouth daily.          . cetirizine (ZYRTEC) 10 MG tablet   Oral   Take 1 tablet (10 mg total) by mouth daily.   30 tablet   11   . cloNIDine (CATAPRES) 0.1 MG tablet   Oral   Take 0.1 mg by mouth 2 (two) times daily.         . famotidine (PEPCID) 20 MG tablet   Oral   Take 1 tablet (20 mg total) by mouth at bedtime.   30 tablet   11   . gabapentin (NEURONTIN) 100 MG capsule   Oral   Take 1 capsule (100 mg total) by mouth 3 (three) times daily.   90 capsule   2   . losartan (COZAAR) 25 MG tablet   Oral   Take 1 tablet (25 mg total) by mouth daily.         . Multiple Vitamin (MULTIVITAMIN WITH MINERALS) TABS   Oral   Take 1 tablet by mouth daily.         Marland Kitchen oxyCODONE-acetaminophen (PERCOCET) 7.5-325 MG per tablet   Oral   Take 1-2 tablets by mouth every 6 (six) hours as needed for pain.   30 tablet   0   . zolpidem (AMBIEN) 5 MG tablet   Oral   Take 1 tablet (5 mg total) by mouth at bedtime as needed for sleep.   30 tablet   1    BP 139/85  Pulse 87  Temp(Src) 97.8 F (36.6 C) (Oral)  Resp 28  SpO2 99% Physical Exam  Nursing note and vitals reviewed. Constitutional: She is oriented to person, place, and time. She appears well-developed and well-nourished. No distress.  HENT:  Head: Normocephalic.  Mouth/Throat: Oropharynx is clear and moist.  Eyes: Conjunctivae and EOM are normal. Pupils are equal, round, and reactive to light.  Neck: Normal range of motion.  Cardiovascular: Normal rate, regular rhythm and intact distal pulses.   Pulmonary/Chest: Effort normal and breath sounds normal. No stridor. She has no wheezes.  Abdominal: Soft. Bowel sounds are normal. She exhibits no distension and no mass. There is no tenderness. There is no rebound  and no guarding.  Musculoskeletal: Normal range of motion.  No point tenderness to percussion of lumbar spinal processes.  No TTP or paraspinal spasm. Strength is 5 out of 5 to bilateral lower extremities at hip and knee, extensor hallucis longus 5 out of 5. Ankle strength 5 out of 5, no clonus, neurovascularly intact. Strait leg raise is negative bilaterally. No saddle anaesthesia    Neurological: She is alert and oriented to person, place, and time. She displays normal reflexes.  Psychiatric: She has a normal mood and affect.    ED Course  Procedures (including critical care time) Labs Review Labs Reviewed - No data to display Imaging Review No results found.  MDM   1. Acute exacerbation of chronic low back pain     Filed Vitals:   03/07/13 0919  BP: 139/85  Pulse: 87  Temp: 97.8 F (36.6 C)  TempSrc: Oral  Resp: 28  SpO2: 99%     CORTNEE Milling is a 62 y.o. female with exacerbation to chronic low back pain, Pt had MRI with no acute findings. No neurological deficits and normal neuro exam.  Patient can walk but states is painful.  No loss of bowel or bladder control.  No concern for cauda equina.  No fever, night sweats, weight loss,  IVDU.    Medications  morphine 4 MG/ML injection 6 mg (6 mg Intramuscular Given 03/07/13 1009)  ondansetron (ZOFRAN-ODT) disintegrating tablet 4 mg (4 mg Oral Given 03/07/13 1009)    Pt is hemodynamically stable, appropriate for, and amenable to discharge at this time. Pt verbalized understanding and agrees with care plan. All questions answered. Outpatient follow-up and specific return precautions discussed.    Note: Portions of this report may have been transcribed using voice recognition software. Every effort was made to ensure accuracy; however, inadvertent computerized transcription errors may be present      Monico Blitz, PA-C 03/12/13 0805

## 2013-03-08 ENCOUNTER — Ambulatory Visit: Payer: Medicare Other

## 2013-03-08 ENCOUNTER — Encounter: Payer: Self-pay | Admitting: Internal Medicine

## 2013-03-08 ENCOUNTER — Ambulatory Visit (INDEPENDENT_AMBULATORY_CARE_PROVIDER_SITE_OTHER): Payer: Medicare Other | Admitting: Internal Medicine

## 2013-03-08 VITALS — BP 122/85 | HR 69 | Temp 97.0°F | Ht 63.0 in | Wt 133.1 lb

## 2013-03-08 DIAGNOSIS — M545 Low back pain, unspecified: Secondary | ICD-10-CM

## 2013-03-08 DIAGNOSIS — C50919 Malignant neoplasm of unspecified site of unspecified female breast: Secondary | ICD-10-CM

## 2013-03-08 DIAGNOSIS — C50911 Malignant neoplasm of unspecified site of right female breast: Secondary | ICD-10-CM

## 2013-03-08 MED ORDER — OXYCODONE-ACETAMINOPHEN 7.5-325 MG PO TABS: 1.0000 | ORAL_TABLET | Freq: Four times a day (QID) | ORAL | Status: DC | PRN

## 2013-03-08 MED ORDER — GABAPENTIN 300 MG PO CAPS: 300.0000 mg | ORAL_CAPSULE | Freq: Three times a day (TID) | ORAL | Status: DC

## 2013-03-08 NOTE — Progress Notes (Signed)
HPI The patient is a 62 y.o. female with a history of HTN, tobacco abuse, CKD, breast cancer, low back pain, presenting for a hospital follow-up.  The patient was hospitalized 9/29-10/14 acute on chronic low back pain. 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 The patient is planning for neurosurgery 10/14 for treatment of this. Since hospital discharge, pain is the same, described as a low back pain radiating to the R hip.  The patient is taking Gabapentin 100 mg TID.  The patient has received 30 percocet 9/24, and 10/1.  She has received several hydrocodone prescriptions during this time as well.  The patient has history of breast cancer. During her hospitalization, it was revealed that the patient has missed several radiation appointments, and will need to have make up appointments to stay on track with her treatment.  The patient has continued to miss these appointments, due to this pain, though she notes completing 1 appointment since her hospitalization.  ROS: General: no fevers, chills, changes in weight, changes in appetite Skin: no rash HEENT: no blurry vision, hearing changes, sore throat Pulm: no dyspnea, coughing, wheezing CV: no chest pain, palpitations, shortness of breath Abd: no abdominal pain, nausea/vomiting, diarrhea/constipation GU: no dysuria, hematuria, polyuria Ext: no arthralgias, myalgias Neuro: see HPI  Filed Vitals:   03/08/13 0903  BP: 122/85  Pulse: 69  Temp: 97 F (36.1 C)    PEX General: alert, cooperative, rocking back and forth HEENT: pupils equal round and reactive to light, vision grossly intact, oropharynx clear and non-erythematous  Neck: supple, no lymphadenopathy Lungs: clear to ascultation bilaterally, normal work of respiration, no wheezes, rales, ronchi Heart: regular rate and rhythm, no murmurs, gallops, or rubs Abdomen: soft, non-tender, non-distended, normal bowel sounds Back: Moderate pain on palpation of the lower  back Extremities: no cyanosis, clubbing, or edema Neurologic: alert & oriented X3, cranial nerves II-XII intact, strength grossly intact, sensation intact to light touch  Current Outpatient Prescriptions on File Prior to Visit  Medication Sig Dispense Refill  . albuterol (PROVENTIL HFA;VENTOLIN HFA) 108 (90 BASE) MCG/ACT inhaler Inhale 2 puffs into the lungs every 6 (six) hours as needed for wheezing.      Marland Kitchen amLODipine (NORVASC) 10 MG tablet Take 10 mg by mouth daily.      Marland Kitchen aspirin 325 MG EC tablet Take 325 mg by mouth daily.      . B Complex Vitamins (VITAMIN B COMPLEX PO) Take 1 tablet by mouth daily.       . cetirizine (ZYRTEC) 10 MG tablet Take 10 mg by mouth daily.      . cloNIDine (CATAPRES) 0.1 MG tablet Take 0.1 mg by mouth 2 (two) times daily.      . famotidine (PEPCID) 20 MG tablet Take 20 mg by mouth at bedtime.      . gabapentin (NEURONTIN) 100 MG capsule Take 100 mg by mouth 3 (three) times daily.      Marland Kitchen ibuprofen (ADVIL,MOTRIN) 200 MG tablet Take 600 mg by mouth every 6 (six) hours as needed for pain.      Marland Kitchen losartan (COZAAR) 25 MG tablet Take 25 mg by mouth daily.      . Multiple Vitamin (MULTIVITAMIN WITH MINERALS) TABS Take 1 tablet by mouth daily.      . naproxen sodium (ANAPROX) 220 MG tablet Take 220 mg by mouth 2 (two) times daily as needed (pain).      Marland Kitchen oxyCODONE-acetaminophen (PERCOCET) 7.5-325 MG per tablet Take 1-2  tablets by mouth every 6 (six) hours as needed for pain.      Marland Kitchen zolpidem (AMBIEN) 5 MG tablet Take 5 mg by mouth at bedtime.      . [DISCONTINUED] lisinopril (PRINIVIL,ZESTRIL) 10 MG tablet Take 1 tablet (10 mg total) by mouth daily.  30 tablet  6   No current facility-administered medications on file prior to visit.    Assessment/Plan

## 2013-03-08 NOTE — Assessment & Plan Note (Signed)
The patient has a history of breast cancer, currently undergoing radiation therapy. The patient has missed several radiation oncology appointments, do to her low back pain and inability to lie flat. We discussed the importance of keeping these appointments, and the danger of missing them. -See low back pain above -Patient notes an upcoming radiation oncology appointment, and is strongly encouraged to keep this appointment

## 2013-03-08 NOTE — Patient Instructions (Signed)
General Instructions: For your back pain, it is very important to keep your appointment with Neurosurgery on Tuesday, 10/14 -we are increasing the dose of your Gabapentin to 300 mg Three times per day -we have provided another prescription for percocet for your short term pain, to help you until your appointment with Neurosurgery.  This will not be a long-term medication, but we can provide this to help ease your pain until your appointment on 10/14  It is also very important to keep your Radiation Oncology appointments.  Even though you can make up missed appointments, that is no substitute for the initial treatment.  Treatment Goals:  Goals (1 Years of Data) as of 03/08/13         As of Today 03/07/13 03/01/13 03/01/13 03/01/13     Blood Pressure    . Blood Pressure < 140/90  122/85 139/85 152/86 155/103 151/86     Lifestyle    . Quit smoking / using tobacco            Progress Toward Treatment Goals:  Treatment Goal 03/08/2013  Blood pressure at goal  Stop smoking smoking the same amount    Self Care Goals & Plans:  Self Care Goal 03/08/2013  Manage my medications take my medicines as prescribed; bring my medications to every visit; refill my medications on time; follow the sick day instructions if I am sick  Monitor my health keep track of my blood pressure; keep track of my weight  Eat healthy foods eat more vegetables; eat fruit for snacks and desserts; eat smaller portions; eat baked foods instead of fried foods; eat foods that are low in salt  Be physically active find an activity I enjoy; take a walk every day  Stop smoking -    No flowsheet data found.   Care Management & Community Referrals:  Referral 12/21/2012  Referrals made for care management support none needed  Referrals made to community resources none

## 2013-03-08 NOTE — Assessment & Plan Note (Signed)
The patient notes that her low back pain, consistent with MRI findings of L2-3 degenerative disc disease and new fragment at L3, has remain unchanged his hospitalization. She has been using gabapentin and Percocet for control of her pain, and notes that she has 6 tablets remaining on her rectus a prescription from 10/1. -Prescribed Percocet. Explained that this was only a short-term measure to get her through until her neurosurgery appointment, and that this will not be a long-term medication for her -Neurosurgery appointment 10/14 -Increase gabapentin to 300 mg 3 times a day, from 100 mg 3 times a day

## 2013-03-09 ENCOUNTER — Ambulatory Visit
Admission: RE | Admit: 2013-03-09 | Discharge: 2013-03-09 | Disposition: A | Discharge status: Home / Self Care | Payer: Medicare Other | Source: Ambulatory Visit | Attending: Radiation Oncology | Admitting: Radiation Oncology

## 2013-03-09 ENCOUNTER — Ambulatory Visit: Payer: Medicare Other

## 2013-03-09 ENCOUNTER — Encounter: Payer: Self-pay | Admitting: Radiation Oncology

## 2013-03-09 VITALS — BP 144/86 | HR 71 | Temp 98.4°F | Resp 20 | Wt 134.1 lb

## 2013-03-09 DIAGNOSIS — IMO0002 Reserved for concepts with insufficient information to code with codable children: Secondary | ICD-10-CM | POA: Diagnosis not present

## 2013-03-09 DIAGNOSIS — C50911 Malignant neoplasm of unspecified site of right female breast: Secondary | ICD-10-CM

## 2013-03-09 DIAGNOSIS — Z510 Encounter for antineoplastic radiation therapy: Secondary | ICD-10-CM | POA: Diagnosis not present

## 2013-03-09 DIAGNOSIS — L589 Radiodermatitis, unspecified: Secondary | ICD-10-CM | POA: Diagnosis not present

## 2013-03-09 DIAGNOSIS — C50919 Malignant neoplasm of unspecified site of unspecified female breast: Secondary | ICD-10-CM | POA: Diagnosis not present

## 2013-03-09 DIAGNOSIS — M5430 Sciatica, unspecified side: Secondary | ICD-10-CM | POA: Diagnosis not present

## 2013-03-09 NOTE — Progress Notes (Signed)
Weekly Management Note:  Site: Right Current Dose:  4320  cGy Projected Dose: Initial 5040  cGy with additional treatments to make up for her numerous treatment breaks.  Narrative: The patient is seen today for routine under treatment assessment. CBCT/MVCT images/port films were reviewed. The chart was reviewed.   Her major complaint is that of continued back pain. She is currently on gabapentin and Percocet when necessary. She'll see Dr. Sherley Bounds later this month.  Physical Examination:  Filed Vitals:   03/09/13 1017  BP: 144/86  Pulse: 71  Temp: 98.4 F (36.9 C)  Resp: 20  .  Weight: 134 lb 1.6 oz (60.827 kg). There is marked hyperpigmentation the skin along the right breast with dry desquamation. Her hematoma is for the most part unchanged.  Impression: Tolerating radiation therapy well, however she is missed multiple treatments and will need to have additional treatments because of reduced radio biologic effectiveness. Will make a determination next week as to have any more treatments and I will consult physics.  Plan: Continue radiation therapy as planned.

## 2013-03-09 NOTE — Progress Notes (Signed)
Pt states she got an injection of Morphine yesterday in ED for her hip, leg pain. She states she went home and was able to do work around her house. She states last night she took Gabapentin, Percocet for increasing pain, and the pain "was calmed down". Pt states she took both medications this morning, but still has moderate pain. Pt verbalizes no other c/o.

## 2013-03-09 NOTE — Progress Notes (Signed)
Case discussed with Dr. Brown at the time of the visit.  We reviewed the resident's history and exam and pertinent patient test results.  I agree with the assessment, diagnosis, and plan of care documented in the resident's note. 

## 2013-03-10 ENCOUNTER — Ambulatory Visit
Admission: RE | Admit: 2013-03-10 | Discharge: 2013-03-10 | Disposition: A | Discharge status: Home / Self Care | Payer: Medicare Other | Source: Ambulatory Visit | Attending: Radiation Oncology | Admitting: Radiation Oncology

## 2013-03-10 ENCOUNTER — Ambulatory Visit: Payer: Medicare Other

## 2013-03-10 DIAGNOSIS — C50919 Malignant neoplasm of unspecified site of unspecified female breast: Secondary | ICD-10-CM | POA: Diagnosis not present

## 2013-03-10 DIAGNOSIS — M5430 Sciatica, unspecified side: Secondary | ICD-10-CM | POA: Diagnosis not present

## 2013-03-10 DIAGNOSIS — L589 Radiodermatitis, unspecified: Secondary | ICD-10-CM | POA: Diagnosis not present

## 2013-03-10 DIAGNOSIS — Z510 Encounter for antineoplastic radiation therapy: Secondary | ICD-10-CM | POA: Diagnosis not present

## 2013-03-10 DIAGNOSIS — IMO0002 Reserved for concepts with insufficient information to code with codable children: Secondary | ICD-10-CM | POA: Diagnosis not present

## 2013-03-13 ENCOUNTER — Ambulatory Visit
Admission: RE | Admit: 2013-03-13 | Discharge: 2013-03-13 | Disposition: A | Discharge status: Home / Self Care | Payer: Medicare Other | Source: Ambulatory Visit | Attending: Radiation Oncology | Admitting: Radiation Oncology

## 2013-03-13 ENCOUNTER — Encounter: Payer: Self-pay | Admitting: Radiation Oncology

## 2013-03-13 VITALS — BP 145/87 | HR 82 | Temp 98.3°F | Resp 20 | Wt 135.8 lb

## 2013-03-13 DIAGNOSIS — L589 Radiodermatitis, unspecified: Secondary | ICD-10-CM | POA: Diagnosis not present

## 2013-03-13 DIAGNOSIS — IMO0002 Reserved for concepts with insufficient information to code with codable children: Secondary | ICD-10-CM | POA: Diagnosis not present

## 2013-03-13 DIAGNOSIS — C50919 Malignant neoplasm of unspecified site of unspecified female breast: Secondary | ICD-10-CM | POA: Diagnosis not present

## 2013-03-13 DIAGNOSIS — C50319 Malignant neoplasm of lower-inner quadrant of unspecified female breast: Secondary | ICD-10-CM | POA: Diagnosis not present

## 2013-03-13 DIAGNOSIS — C50911 Malignant neoplasm of unspecified site of right female breast: Secondary | ICD-10-CM

## 2013-03-13 DIAGNOSIS — M5430 Sciatica, unspecified side: Secondary | ICD-10-CM | POA: Diagnosis not present

## 2013-03-13 DIAGNOSIS — Z510 Encounter for antineoplastic radiation therapy: Secondary | ICD-10-CM | POA: Diagnosis not present

## 2013-03-13 NOTE — Progress Notes (Signed)
Pt c/o intermittently w/left leg , hip pain. She has appointment w/neurologist tomorrow. She is applying Radiaplex to right breast treatment area, has some areas of dry desquamation. Pt fatigued, no loss of appetite.

## 2013-03-13 NOTE — Progress Notes (Signed)
Weekly Management Note:  Site: Right breast Current Dose:  4680  cGy Projected Dose: 5040  cGy  Narrative: The patient is seen today for routine under treatment assessment. CBCT/MVCT images/port films were reviewed. The chart was reviewed.   She still complains of radicular back pain. She will see Dr. Sherley Bounds later this week. 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  Physical Examination:  Filed Vitals:   03/13/13 1120  BP: 145/87  Pulse: 82  Temp: 98.3 F (36.8 C)  Resp: 20  .  Weight: 135 lb 12.8 oz (61.598 kg). There is marked hyperpigmentation the skin along the right breast with patchy dry desquamation. No areas of moist desquamation  Impression: Tolerating radiation therapy well. She will need additional treatments to make up for her loss of biological effective dose because of her treatment breaks. She may need a few additional treatments in addition to the prescribed 28 treatments. Plan: Continue radiation therapy as planned.

## 2013-03-14 ENCOUNTER — Ambulatory Visit: Payer: Medicare Other | Admitting: Internal Medicine

## 2013-03-14 ENCOUNTER — Ambulatory Visit: Payer: Medicare Other

## 2013-03-14 ENCOUNTER — Other Ambulatory Visit: Payer: Self-pay | Admitting: Neurological Surgery

## 2013-03-14 ENCOUNTER — Telehealth: Payer: Self-pay

## 2013-03-14 DIAGNOSIS — M5126 Other intervertebral disc displacement, lumbar region: Secondary | ICD-10-CM | POA: Diagnosis not present

## 2013-03-14 NOTE — ED Provider Notes (Signed)
Medical screening examination/treatment/procedure(s) were performed by non-physician practitioner and as supervising physician I was immediately available for consultation/collaboration.   Houston Siren, MD 03/14/13 1452

## 2013-03-14 NOTE — Telephone Encounter (Signed)
Patient called to state she had another doctor appointment and wants to come for tx now.Monique Henderson RT informed to call [phone] with appointment time.

## 2013-03-15 ENCOUNTER — Other Ambulatory Visit: Payer: Self-pay | Admitting: Neurological Surgery

## 2013-03-15 ENCOUNTER — Ambulatory Visit
Admission: RE | Admit: 2013-03-15 | Discharge: 2013-03-15 | Disposition: A | Discharge status: Home / Self Care | Payer: Medicare Other | Source: Ambulatory Visit | Attending: Radiation Oncology | Admitting: Radiation Oncology

## 2013-03-15 DIAGNOSIS — Z510 Encounter for antineoplastic radiation therapy: Secondary | ICD-10-CM | POA: Diagnosis not present

## 2013-03-15 DIAGNOSIS — IMO0002 Reserved for concepts with insufficient information to code with codable children: Secondary | ICD-10-CM | POA: Diagnosis not present

## 2013-03-15 DIAGNOSIS — C50919 Malignant neoplasm of unspecified site of unspecified female breast: Secondary | ICD-10-CM | POA: Diagnosis not present

## 2013-03-15 DIAGNOSIS — M5430 Sciatica, unspecified side: Secondary | ICD-10-CM | POA: Diagnosis not present

## 2013-03-15 DIAGNOSIS — L589 Radiodermatitis, unspecified: Secondary | ICD-10-CM | POA: Diagnosis not present

## 2013-03-16 ENCOUNTER — Ambulatory Visit
Admission: RE | Admit: 2013-03-16 | Discharge: 2013-03-16 | Disposition: A | Discharge status: Home / Self Care | Payer: Medicare Other | Source: Ambulatory Visit | Attending: Radiation Oncology | Admitting: Radiation Oncology

## 2013-03-16 DIAGNOSIS — Z510 Encounter for antineoplastic radiation therapy: Secondary | ICD-10-CM | POA: Diagnosis not present

## 2013-03-16 DIAGNOSIS — L589 Radiodermatitis, unspecified: Secondary | ICD-10-CM | POA: Diagnosis not present

## 2013-03-16 DIAGNOSIS — M5430 Sciatica, unspecified side: Secondary | ICD-10-CM | POA: Diagnosis not present

## 2013-03-16 DIAGNOSIS — IMO0002 Reserved for concepts with insufficient information to code with codable children: Secondary | ICD-10-CM | POA: Diagnosis not present

## 2013-03-16 DIAGNOSIS — C50919 Malignant neoplasm of unspecified site of unspecified female breast: Secondary | ICD-10-CM | POA: Diagnosis not present

## 2013-03-17 ENCOUNTER — Ambulatory Visit: Payer: Medicare Other

## 2013-03-20 ENCOUNTER — Ambulatory Visit
Admission: RE | Admit: 2013-03-20 | Discharge: 2013-03-20 | Disposition: A | Discharge status: Home / Self Care | Payer: Medicare Other | Source: Ambulatory Visit | Attending: Radiation Oncology | Admitting: Radiation Oncology

## 2013-03-20 ENCOUNTER — Encounter: Payer: Self-pay | Admitting: Radiation Oncology

## 2013-03-20 VITALS — BP 120/79 | HR 61 | Temp 97.9°F | Resp 20 | Wt 133.1 lb

## 2013-03-20 DIAGNOSIS — D0511 Intraductal carcinoma in situ of right breast: Secondary | ICD-10-CM

## 2013-03-20 DIAGNOSIS — L589 Radiodermatitis, unspecified: Secondary | ICD-10-CM | POA: Diagnosis not present

## 2013-03-20 DIAGNOSIS — IMO0002 Reserved for concepts with insufficient information to code with codable children: Secondary | ICD-10-CM | POA: Diagnosis not present

## 2013-03-20 DIAGNOSIS — C50919 Malignant neoplasm of unspecified site of unspecified female breast: Secondary | ICD-10-CM | POA: Diagnosis not present

## 2013-03-20 DIAGNOSIS — M5430 Sciatica, unspecified side: Secondary | ICD-10-CM | POA: Diagnosis not present

## 2013-03-20 DIAGNOSIS — Z510 Encounter for antineoplastic radiation therapy: Secondary | ICD-10-CM | POA: Diagnosis not present

## 2013-03-20 NOTE — Progress Notes (Addendum)
Pt states she has a bulging disk in her back and will have surgery per Dr Ronnald Ramp this [place]. She c/o pain in her back today. She is fatigued, applying Biafine to right breast for dry desquamation.

## 2013-03-20 NOTE — Progress Notes (Signed)
Weekly Management Note:  Site: Right breast Current Dose:  5220  cGy Projected Dose: 5400  cGy  Narrative: The patient is seen today for routine under treatment assessment. CBCT/MVCT images/port films were reviewed. The chart was reviewed.   She tells me she will have low back surgery later this week with Dr. Ronnald Ramp. We added 2 more treatments because of her treatment breaks.  Physical Examination:  Filed Vitals:   03/20/13 1513  BP: 120/79  Pulse: 61  Temp: 97.9 F (36.6 C)  Resp: 20  .  Weight: 133 lb 1.6 oz (60.374 kg). There is marked hyperpigmentation the skin along with dry desquamation. No areas of moist desquamation.  Impression: Tolerating radiation therapy well. She will finish her radiation therapy tomorrow.  Plan: Continue radiation therapy as planned. One-month followup appointment after completion of radiation therapy.

## 2013-03-21 ENCOUNTER — Telehealth: Payer: Self-pay | Admitting: Oncology

## 2013-03-21 ENCOUNTER — Encounter (HOSPITAL_COMMUNITY)
Admission: RE | Admit: 2013-03-21 | Discharge: 2013-03-21 | Disposition: A | Discharge status: Home / Self Care | Payer: Medicare Other | Source: Ambulatory Visit | Attending: Neurological Surgery | Admitting: Neurological Surgery

## 2013-03-21 ENCOUNTER — Ambulatory Visit: Payer: Medicare Other

## 2013-03-21 ENCOUNTER — Encounter (HOSPITAL_COMMUNITY): Payer: Self-pay | Admitting: Emergency Medicine

## 2013-03-21 ENCOUNTER — Emergency Department (HOSPITAL_COMMUNITY)
Admission: EM | Admit: 2013-03-21 | Discharge: 2013-03-21 | Disposition: A | Discharge status: Home / Self Care | Payer: Medicare Other | Attending: Emergency Medicine | Admitting: Emergency Medicine

## 2013-03-21 ENCOUNTER — Encounter (HOSPITAL_COMMUNITY): Payer: Self-pay

## 2013-03-21 DIAGNOSIS — Z79899 Other long term (current) drug therapy: Secondary | ICD-10-CM | POA: Diagnosis not present

## 2013-03-21 DIAGNOSIS — G479 Sleep disorder, unspecified: Secondary | ICD-10-CM | POA: Diagnosis not present

## 2013-03-21 DIAGNOSIS — Z853 Personal history of malignant neoplasm of breast: Secondary | ICD-10-CM | POA: Insufficient documentation

## 2013-03-21 DIAGNOSIS — R0602 Shortness of breath: Secondary | ICD-10-CM | POA: Diagnosis not present

## 2013-03-21 DIAGNOSIS — Z7982 Long term (current) use of aspirin: Secondary | ICD-10-CM | POA: Diagnosis not present

## 2013-03-21 DIAGNOSIS — IMO0002 Reserved for concepts with insufficient information to code with codable children: Secondary | ICD-10-CM | POA: Diagnosis not present

## 2013-03-21 DIAGNOSIS — K219 Gastro-esophageal reflux disease without esophagitis: Secondary | ICD-10-CM | POA: Diagnosis not present

## 2013-03-21 DIAGNOSIS — M5126 Other intervertebral disc displacement, lumbar region: Secondary | ICD-10-CM | POA: Diagnosis not present

## 2013-03-21 DIAGNOSIS — Z8739 Personal history of other diseases of the musculoskeletal system and connective tissue: Secondary | ICD-10-CM | POA: Diagnosis not present

## 2013-03-21 DIAGNOSIS — I10 Essential (primary) hypertension: Secondary | ICD-10-CM | POA: Insufficient documentation

## 2013-03-21 DIAGNOSIS — F17200 Nicotine dependence, unspecified, uncomplicated: Secondary | ICD-10-CM | POA: Insufficient documentation

## 2013-03-21 DIAGNOSIS — M1990 Unspecified osteoarthritis, unspecified site: Secondary | ICD-10-CM | POA: Diagnosis not present

## 2013-03-21 DIAGNOSIS — Z8742 Personal history of other diseases of the female genital tract: Secondary | ICD-10-CM | POA: Diagnosis not present

## 2013-03-21 DIAGNOSIS — M5416 Radiculopathy, lumbar region: Secondary | ICD-10-CM

## 2013-03-21 DIAGNOSIS — D649 Anemia, unspecified: Secondary | ICD-10-CM | POA: Insufficient documentation

## 2013-03-21 DIAGNOSIS — Z8619 Personal history of other infectious and parasitic diseases: Secondary | ICD-10-CM | POA: Insufficient documentation

## 2013-03-21 DIAGNOSIS — Z01812 Encounter for preprocedural laboratory examination: Secondary | ICD-10-CM | POA: Diagnosis not present

## 2013-03-21 DIAGNOSIS — Z8709 Personal history of other diseases of the respiratory system: Secondary | ICD-10-CM | POA: Insufficient documentation

## 2013-03-21 DIAGNOSIS — Z87898 Personal history of other specified conditions: Secondary | ICD-10-CM | POA: Diagnosis not present

## 2013-03-21 DIAGNOSIS — Z923 Personal history of irradiation: Secondary | ICD-10-CM | POA: Diagnosis not present

## 2013-03-21 HISTORY — DX: Shortness of breath: R06.02

## 2013-03-21 LAB — PROTIME-INR
INR: 0.87 (ref 0.00–1.49)
Prothrombin Time: 11.7 seconds (ref 11.6–15.2)

## 2013-03-21 LAB — CBC WITH DIFFERENTIAL/PLATELET
Basophils Absolute: 0 10*3/uL (ref 0.0–0.1)
Basophils Relative: 1 % (ref 0–1)
Eosinophils Absolute: 0.1 10*3/uL (ref 0.0–0.7)
Hemoglobin: 13.5 g/dL (ref 12.0–15.0)
Lymphocytes Relative: 18 % (ref 12–46)
MCH: 30.3 pg (ref 26.0–34.0)
MCHC: 33.8 g/dL (ref 30.0–36.0)
Monocytes Relative: 10 % (ref 3–12)
Neutro Abs: 4.5 10*3/uL (ref 1.7–7.7)
Neutrophils Relative %: 69 % (ref 43–77)
Platelets: 431 10*3/uL — ABNORMAL HIGH (ref 150–400)
RDW: 14 % (ref 11.5–15.5)
WBC: 6.5 10*3/uL (ref 4.0–10.5)

## 2013-03-21 LAB — SURGICAL PCR SCREEN: MRSA, PCR: NEGATIVE

## 2013-03-21 LAB — BASIC METABOLIC PANEL
BUN: 20 mg/dL (ref 6–23)
Calcium: 9.7 mg/dL (ref 8.4–10.5)
Creatinine, Ser: 1.53 mg/dL — ABNORMAL HIGH (ref 0.50–1.10)
GFR calc Af Amer: 42 mL/min — ABNORMAL LOW (ref >90)
GFR calc non Af Amer: 36 mL/min — ABNORMAL LOW (ref >90)
Potassium: 3.4 mEq/L — ABNORMAL LOW (ref 3.5–5.1)

## 2013-03-21 MED ORDER — ONDANSETRON 4 MG PO TBDP
4.0000 mg | ORAL_TABLET | Freq: Once | ORAL | Status: AC
  Administered 2013-03-21: 4 mg via ORAL
  Filled 2013-03-21: qty 1

## 2013-03-21 MED ORDER — GABAPENTIN 300 MG PO CAPS: 300.0000 mg | ORAL_CAPSULE | Freq: Three times a day (TID) | ORAL | Status: DC

## 2013-03-21 MED ORDER — OXYCODONE HCL 10 MG PO TABS: 10.0000 mg | ORAL_TABLET | ORAL | Status: DC

## 2013-03-21 MED ORDER — HYDROMORPHONE HCL PF 2 MG/ML IJ SOLN
2.0000 mg | Freq: Once | INTRAMUSCULAR | Status: AC
  Administered 2013-03-21: 2 mg via INTRAMUSCULAR
  Filled 2013-03-21: qty 1

## 2013-03-21 NOTE — ED Notes (Signed)
Pt reports that she has a herniated disc and she is suppose to have surgery on Thursday but needs some pain medication til her surgery. Pt reports she has a burning sensation down her leg from the disc.

## 2013-03-21 NOTE — ED Provider Notes (Signed)
  Medical screening examination/treatment/procedure(s) were performed by non-physician practitioner and as supervising physician I was immediately available for consultation/collaboration.    Carmin Muskrat, MD 03/21/13 2008

## 2013-03-21 NOTE — Telephone Encounter (Signed)
Monique Henderson's friend called and said she would not be coming to her radiation treatment today due to pain.  He said she has a bulging disk in her back and is having surgery on Thursday.  She is hurting too bad to come in today.  She would like to try to come in tomorrow for treatment.  Notified Stanton Kidney, Chief Operating Officer on linac 2.

## 2013-03-21 NOTE — ED Notes (Signed)
Signature pad not working. Pt verbalized understanding of d/c and f/u.  No additional questions by pt.  Pt ambulatory at discharge.

## 2013-03-21 NOTE — Pre-Procedure Instructions (Signed)
SURIYA BALDY  03/21/2013   Your procedure is scheduled on:  Thursday, October 23rd  Report to Main Entrance "A" and check in with admitting at 1:00 PM.  Call this number if you have problems the morning of surgery: [phone]   Remember:   Do not eat food or drink liquids after midnight.   Take these medicines the morning of surgery with A SIP OF WATER: Norvasc, clonidine, neurontin, percocet if needed, albuterol if needed   Do not wear jewelry, make-up or nail polish.  Do not wear lotions, powders, or perfumes. You may wear deodorant.  Do not shave 48 hours prior to surgery. Men may shave face and neck.  Do not bring valuables to the hospital.  [facility] is not responsible  for any belongings or valuables.               Contacts, dentures or bridgework may not be worn into surgery.  Leave suitcase in the car. After surgery it may be brought to your room.  For patients admitted to the hospital, discharge time is determined by your  treatment team.               Patients discharged the day of surgery will not be allowed to drive home.    Special Instructions: Shower using CHG 2 nights before surgery and the night before surgery.  If you shower the day of surgery use CHG.  Use special wash - you have one bottle of CHG for all showers.  You should use approximately 1/3 of the bottle for each shower.   Please read over the following fact sheets that you were given: Pain Booklet, Coughing and Deep Breathing, MRSA Information and Surgical Site Infection Prevention

## 2013-03-21 NOTE — Progress Notes (Signed)
Primary physician - dr. Bertha Stakes Does not have a cardiologist   Patient in severe pain at pat visit and has ran out of rx pain medication. Encouraged patient to go to emergency room if pain does not get any better.

## 2013-03-21 NOTE — ED Provider Notes (Signed)
CSN: [id number]     Arrival date & time 03/21/13  1552 History   First MD Initiated Contact with Patient 03/21/13 1629     Chief Complaint  Patient presents with  . Back Pain  . Medication Refill   (Consider location/radiation/quality/duration/timing/severity/associated sxs/prior Treatment) HPI Comments: Patient with history of lumbar radiculopathy, surgery scheduled for 2 days from now by Dr. Ronnald Ramp, presents with uncontrolled lower back pain with radiation into left leg consistent with previous radiculopathy. Patient states that she is currently out of pain medication. She states that she's been taking 3-4 per day, however husband and that she's been taking 10 mg every 3 hours. Patient was prescribed #80 oxycodone 10 mg tablets on 03/14/13. She has not had loss of bowel or bladder control, urinary retention. Patient states it feels like cold water is running down her leg. No fevers, nausea or vomiting. Patient states she's been having difficulty sleeping due to the pain. She's also been taking gabapentin. The onset of this condition was acute. The course is constant. Aggravating factors: none. Alleviating factors: none.    Patient is a 62 y.o. female presenting with back pain. The history is provided by the patient and medical records.  Back Pain Associated symptoms: no dysuria, no fever, no numbness, no pelvic pain and no weakness     Past Medical History  Diagnosis Date  . GERD (gastroesophageal reflux disease)   . Hepatitis C   . Hypertension   . Low back pain   . Uterine fibroid   . Normocytic anemia     With thrombocytosis  . Right ureteral stone 2002  . Atelectasis 2002    Bilateral  . Bone spur 2008    Right calcaneal foot spur  . Lumbar spinal stenosis     S/P lumbar decompressive laminectomy, fusion and plating for lumbar spinal stensosis  . Hot flashes   . Wears dentures     top  . Hx of radiation therapy 2005    left breast  . 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 Autumn Messing; S/P re-excision of superior and inferior margins 10/27/2012.   . Osteoarthritis   . Shortness of breath     from pain  . Breast cancer 2004    Ductal carcinoma in situ of the left breast; S/P left partial mastectomy 02/26/2003; S/P re-excision of cranial and lateral margins11/18/2004.radiation  . Breast cancer 09/21/2012    right breast/ last radiation treatment 03/22/2013   Past Surgical History  Procedure Laterality Date  . Laminectomy  05/27/2009    Lumbar decompressive laminectomy, fusion and plating for lumbar spinal stensosis  . Breast lumpectomy Left 2011  . Mastectomy, partial Left 02/26/2003    S/P left partial mastectomy 02/26/2003; S/P re-excision of cranial and lateral margins11/18/2004.   . Breast lumpectomy with needle localization and axillary sentinel lymph node bx Right 10/13/2012    Procedure: BREAST LUMPECTOMY WITH NEEDLE LOCALIZATION;  Surgeon: [name], MD;  Location: [place];  Service: General;  Laterality: Right;  Right breast wire localized lumpectomy  . Re-excision of breast cancer,superior margins Right 10/27/2012    Procedure: RE-EXCISION OF BREAST CANCER,SUPERIOR and inferior MARGINS;  Surgeon: [name], MD;  Location: [place];  Service: General;  Laterality: Right;  . Back surgery     Family History  Problem Relation Age of Onset  . Colon cancer Mother 73  . Hypertension Mother   .  Diabetes Sister 70  . Hypertension Sister   . Breast cancer Neg Hx   . Cervical cancer Neg Hx   . Diabetes Brother   . Hypertension Brother   . Diabetes Brother   . Hypertension Brother   . Kidney disease Son     On dialysis  . Hypertension Son   . Diabetes Son   . Multiple sclerosis Son   . Bone cancer Sister 21   History  Substance Use Topics  . Smoking status: Current Some Day Smoker -- 0.20 packs/day for 34 years    Types: Cigarettes  . Smokeless tobacco: Never  Used  . Alcohol Use: 1.2 oz/week    2 Cans of beer per week     Comment: 02/27/2013 "couple beers q weekend"   OB History   Grav Para Term Preterm Abortions TAB SAB Ect Mult Living   2 2 1 1            Review of Systems  Constitutional: Negative for fever and unexpected weight change.  Gastrointestinal: Negative for constipation.       Negative for fecal incontinence.   Genitourinary: Negative for dysuria, hematuria, flank pain, vaginal bleeding, vaginal discharge and pelvic pain.       Negative for urinary incontinence or retention.  Musculoskeletal: Positive for back pain.  Neurological: Negative for weakness and numbness.       Denies saddle paresthesias.    Allergies  Shrimp; Lisinopril; and Vicodin  Home Medications   Current Outpatient Rx  Name  Route  Sig  Dispense  Refill  . albuterol (PROVENTIL HFA;VENTOLIN HFA) 108 (90 BASE) MCG/ACT inhaler   Inhalation   Inhale 2 puffs into the lungs every 6 (six) hours as needed for wheezing.         Marland Kitchen amLODipine (NORVASC) 10 MG tablet   Oral   Take 10 mg by mouth daily.         Marland Kitchen aspirin 325 MG EC tablet   Oral   Take 325 mg by mouth daily.         . B Complex Vitamins (VITAMIN B COMPLEX PO)   Oral   Take 1 tablet by mouth daily.          . cetirizine (ZYRTEC) 10 MG tablet   Oral   Take 10 mg by mouth daily.         . cloNIDine (CATAPRES) 0.1 MG tablet   Oral   Take 0.1 mg by mouth 2 (two) times daily.         . famotidine (PEPCID) 20 MG tablet   Oral   Take 20 mg by mouth at bedtime.         . gabapentin (NEURONTIN) 300 MG capsule   Oral   Take 300 mg by mouth 3 (three) times daily.         Marland Kitchen losartan (COZAAR) 25 MG tablet   Oral   Take 25 mg by mouth daily.         . Multiple Vitamin (MULTIVITAMIN WITH MINERALS) TABS   Oral   Take 1 tablet by mouth daily.         Marland Kitchen omeprazole (PRILOSEC) 40 MG capsule   Oral   Take 40 mg by mouth daily.         . Oxycodone HCl 10 MG TABS    Oral   Take 10 mg by mouth every 3 (three) hours as needed (for pain).         Marland Kitchen  traMADol (ULTRAM) 50 MG tablet   Oral   Take 50 mg by mouth every 6 (six) hours as needed for pain.          Marland Kitchen zolpidem (AMBIEN) 5 MG tablet   Oral   Take 5 mg by mouth at bedtime.          BP 151/95  Pulse 79  Temp(Src) 97.6 F (36.4 C) (Oral)  Resp 16  SpO2 98% Physical Exam  Nursing note and vitals reviewed. Constitutional: She appears well-developed and well-nourished.  Uncomfortable appearing.  HENT:  Head: Normocephalic and atraumatic.  Eyes: Conjunctivae are normal. Right eye exhibits no discharge. Left eye exhibits no discharge.  Neck: Normal range of motion. Neck supple.  Cardiovascular: Normal rate, regular rhythm and normal heart sounds.   Pulmonary/Chest: Effort normal and breath sounds normal.  Abdominal: Soft. There is no tenderness. There is no CVA tenderness.  Musculoskeletal: Normal range of motion.       Cervical back: Normal.       Thoracic back: Normal.       Lumbar back: She exhibits tenderness. She exhibits normal range of motion and no bony tenderness.  No step-off noted with palpation of spine.   Neurological: She is alert. She has normal strength and normal reflexes. No sensory deficit.  5/5 strength in entire lower extremities bilaterally. No sensation deficit.   Skin: Skin is warm and dry. No rash noted.  Psychiatric: She has a normal mood and affect.    ED Course  Procedures (including critical care time) Labs Review Labs Reviewed - No data to display Imaging Review No results found.  EKG Interpretation   None      5:28 PM Patient seen and examined. Work-up initiated. Medications ordered.   Vital signs reviewed and are as follows: Filed Vitals:   03/21/13 1558  BP: 151/95  Pulse: 79  Temp: 97.6 F (36.4 C)  Resp: 16   Patient much improved after IM dilaudid. She is ready to go home. Will give pain medicine for home patient only given enough  to last until Thursday morning (2 days from now) and patient informed that she needs to take these only as prescribed.  No red flag s/s of low back pain. Patient was counseled on back pain precautions and told to do activity as tolerated but do not lift, push, or pull heavy objects more than 10 pounds for the next week.  Patient counseled to use ice or heat on back for no longer than 15 minutes every hour.   Patient prescribed narcotic pain medicine and counseled on proper use of narcotic pain medications. Counseled not to combine this medication with others containing tylenol.   Urged patient not to drink alcohol, drive, or perform any other activities that requires focus while taking these medications.  Patient urged to follow-up with PCP if pain does not improve with treatment and rest or if pain becomes recurrent. Urged to return with worsening severe pain, loss of bowel or bladder control, trouble walking.   The patient verbalizes understanding and agrees with the plan.  MDM   1. Lumbar radiculopathy    Patient with back pain with radicular features, unchanged from previous per patient. No neurological deficits. Patient is ambulatory. No warning symptoms of back pain including: loss of bowel or bladder control, night sweats, waking from sleep with back pain, unexplained fevers or weight loss, h/o cancer, IVDU, recent trauma. No concern for cauda equina, epidural abscess, or other serious cause of  back pain. Conservative measures such as rest, ice/heat and pain medicine indicated with PCP follow-up if no improvement with conservative management.      Carlisle Cater, PA-C 03/21/13 [phone]

## 2013-03-22 ENCOUNTER — Ambulatory Visit
Admission: RE | Admit: 2013-03-22 | Discharge: 2013-03-22 | Disposition: A | Discharge status: Home / Self Care | Payer: Medicare Other | Source: Ambulatory Visit | Attending: Radiation Oncology | Admitting: Radiation Oncology

## 2013-03-22 DIAGNOSIS — C50919 Malignant neoplasm of unspecified site of unspecified female breast: Secondary | ICD-10-CM | POA: Diagnosis not present

## 2013-03-22 DIAGNOSIS — IMO0002 Reserved for concepts with insufficient information to code with codable children: Secondary | ICD-10-CM | POA: Diagnosis not present

## 2013-03-22 DIAGNOSIS — Z510 Encounter for antineoplastic radiation therapy: Secondary | ICD-10-CM | POA: Diagnosis not present

## 2013-03-22 DIAGNOSIS — M5430 Sciatica, unspecified side: Secondary | ICD-10-CM | POA: Diagnosis not present

## 2013-03-22 DIAGNOSIS — L589 Radiodermatitis, unspecified: Secondary | ICD-10-CM | POA: Diagnosis not present

## 2013-03-22 MED ORDER — CEFAZOLIN SODIUM-DEXTROSE 2-3 GM-% IV SOLR
2.0000 g | INTRAVENOUS | Status: AC
  Administered 2013-03-23: 2 g via INTRAVENOUS
  Filled 2013-03-22: qty 50

## 2013-03-22 NOTE — Progress Notes (Signed)
LEFT MESSAGE FOR PATIENT TO ARRIVE AT 730 AM.

## 2013-03-23 ENCOUNTER — Ambulatory Visit (HOSPITAL_COMMUNITY): Payer: Medicare Other | Admitting: Anesthesiology

## 2013-03-23 ENCOUNTER — Encounter (HOSPITAL_COMMUNITY): Payer: Self-pay | Admitting: Anesthesiology

## 2013-03-23 ENCOUNTER — Observation Stay (HOSPITAL_COMMUNITY)
Admission: RE | Admit: 2013-03-23 | Discharge: 2013-03-24 | Disposition: A | Discharge status: Home / Self Care | Payer: Medicare Other | Source: Ambulatory Visit | Attending: Neurological Surgery | Admitting: Neurological Surgery

## 2013-03-23 ENCOUNTER — Encounter (HOSPITAL_COMMUNITY): Payer: Medicare Other | Admitting: Anesthesiology

## 2013-03-23 ENCOUNTER — Encounter (HOSPITAL_COMMUNITY)
Admission: RE | Disposition: A | Discharge status: Home / Self Care | Payer: Self-pay | Source: Ambulatory Visit | Attending: Neurological Surgery

## 2013-03-23 ENCOUNTER — Ambulatory Visit (HOSPITAL_COMMUNITY): Payer: Medicare Other

## 2013-03-23 DIAGNOSIS — M5126 Other intervertebral disc displacement, lumbar region: Secondary | ICD-10-CM | POA: Diagnosis not present

## 2013-03-23 DIAGNOSIS — Z9889 Other specified postprocedural states: Secondary | ICD-10-CM

## 2013-03-23 DIAGNOSIS — Z01812 Encounter for preprocedural laboratory examination: Secondary | ICD-10-CM | POA: Insufficient documentation

## 2013-03-23 DIAGNOSIS — Z79899 Other long term (current) drug therapy: Secondary | ICD-10-CM | POA: Insufficient documentation

## 2013-03-23 DIAGNOSIS — I10 Essential (primary) hypertension: Secondary | ICD-10-CM | POA: Insufficient documentation

## 2013-03-23 DIAGNOSIS — K219 Gastro-esophageal reflux disease without esophagitis: Secondary | ICD-10-CM | POA: Diagnosis not present

## 2013-03-23 DIAGNOSIS — R0602 Shortness of breath: Secondary | ICD-10-CM | POA: Insufficient documentation

## 2013-03-23 DIAGNOSIS — M549 Dorsalgia, unspecified: Secondary | ICD-10-CM | POA: Diagnosis not present

## 2013-03-23 DIAGNOSIS — M539 Dorsopathy, unspecified: Secondary | ICD-10-CM | POA: Diagnosis not present

## 2013-03-23 DIAGNOSIS — Z87898 Personal history of other specified conditions: Secondary | ICD-10-CM | POA: Insufficient documentation

## 2013-03-23 DIAGNOSIS — K759 Inflammatory liver disease, unspecified: Secondary | ICD-10-CM | POA: Diagnosis not present

## 2013-03-23 HISTORY — PX: LUMBAR LAMINECTOMY/DECOMPRESSION MICRODISCECTOMY: SHX5026

## 2013-03-23 SURGERY — LUMBAR LAMINECTOMY/DECOMPRESSION MICRODISCECTOMY 1 LEVEL
Anesthesia: General | Site: Back | Laterality: Left | Wound class: Clean

## 2013-03-23 MED ORDER — MIDAZOLAM HCL 2 MG/2ML IJ SOLN
INTRAMUSCULAR | Status: AC
  Filled 2013-03-23: qty 2

## 2013-03-23 MED ORDER — OXYCODONE-ACETAMINOPHEN 5-325 MG PO TABS: 1.0000 | ORAL_TABLET | ORAL | Status: DC | PRN

## 2013-03-23 MED ORDER — OXYCODONE HCL 5 MG PO TABS
5.0000 mg | ORAL_TABLET | Freq: Once | ORAL | Status: AC | PRN
  Administered 2013-03-23: 5 mg via ORAL

## 2013-03-23 MED ORDER — ROCURONIUM BROMIDE 100 MG/10ML IV SOLN
INTRAVENOUS | Status: DC | PRN
  Administered 2013-03-23: 50 mg via INTRAVENOUS

## 2013-03-23 MED ORDER — LOSARTAN POTASSIUM 25 MG PO TABS
25.0000 mg | ORAL_TABLET | Freq: Every day | ORAL | Status: DC
  Administered 2013-03-24: 25 mg via ORAL
  Filled 2013-03-23: qty 1

## 2013-03-23 MED ORDER — PROPOFOL 10 MG/ML IV BOLUS
INTRAVENOUS | Status: DC | PRN
  Administered 2013-03-23: 120 mg via INTRAVENOUS

## 2013-03-23 MED ORDER — ONDANSETRON HCL 4 MG/2ML IJ SOLN: 4.0000 mg | INTRAMUSCULAR | Status: DC | PRN

## 2013-03-23 MED ORDER — THROMBIN 5000 UNITS EX SOLR
OROMUCOSAL | Status: DC | PRN
  Administered 2013-03-23: 15:00:00 via TOPICAL

## 2013-03-23 MED ORDER — PANTOPRAZOLE SODIUM 40 MG PO TBEC
80.0000 mg | DELAYED_RELEASE_TABLET | Freq: Every day | ORAL | Status: DC
  Administered 2013-03-24: 80 mg via ORAL
  Filled 2013-03-23: qty 2

## 2013-03-23 MED ORDER — OXYCODONE HCL 5 MG PO TABS
ORAL_TABLET | ORAL | Status: AC
  Filled 2013-03-23: qty 1

## 2013-03-23 MED ORDER — 0.9 % SODIUM CHLORIDE (POUR BTL) OPTIME
TOPICAL | Status: DC | PRN
  Administered 2013-03-23: 1000 mL

## 2013-03-23 MED ORDER — METHOCARBAMOL 100 MG/ML IJ SOLN
500.0000 mg | Freq: Four times a day (QID) | INTRAVENOUS | Status: DC | PRN
  Filled 2013-03-23: qty 5

## 2013-03-23 MED ORDER — SODIUM CHLORIDE 0.9 % IR SOLN
Status: DC | PRN
  Administered 2013-03-23: 15:00:00

## 2013-03-23 MED ORDER — LIDOCAINE HCL (CARDIAC) 20 MG/ML IV SOLN
INTRAVENOUS | Status: DC | PRN
  Administered 2013-03-23: 40 mg via INTRAVENOUS

## 2013-03-23 MED ORDER — CEFAZOLIN SODIUM 1-5 GM-% IV SOLN
1.0000 g | Freq: Three times a day (TID) | INTRAVENOUS | Status: AC
  Administered 2013-03-23 – 2013-03-24 (×2): 1 g via INTRAVENOUS
  Filled 2013-03-23 (×2): qty 50

## 2013-03-23 MED ORDER — ADULT MULTIVITAMIN W/MINERALS CH
1.0000 | ORAL_TABLET | Freq: Every day | ORAL | Status: DC
  Administered 2013-03-24: 1 via ORAL
  Filled 2013-03-23: qty 1

## 2013-03-23 MED ORDER — MEPERIDINE HCL 25 MG/ML IJ SOLN: 6.2500 mg | INTRAMUSCULAR | Status: DC | PRN

## 2013-03-23 MED ORDER — GLYCOPYRROLATE 0.2 MG/ML IJ SOLN
INTRAMUSCULAR | Status: DC | PRN
  Administered 2013-03-23: 0.6 mg via INTRAVENOUS
  Administered 2013-03-23: 0.2 mg via INTRAVENOUS

## 2013-03-23 MED ORDER — FENTANYL CITRATE 0.05 MG/ML IJ SOLN
INTRAMUSCULAR | Status: DC | PRN
  Administered 2013-03-23: 100 ug via INTRAVENOUS
  Administered 2013-03-23: 50 ug via INTRAVENOUS
  Administered 2013-03-23: 250 ug via INTRAVENOUS

## 2013-03-23 MED ORDER — SODIUM CHLORIDE 0.9 % IJ SOLN
3.0000 mL | Freq: Two times a day (BID) | INTRAMUSCULAR | Status: DC
  Administered 2013-03-23: 3 mL via INTRAVENOUS

## 2013-03-23 MED ORDER — NEOSTIGMINE METHYLSULFATE 1 MG/ML IJ SOLN
INTRAMUSCULAR | Status: DC | PRN
  Administered 2013-03-23: 1 mg via INTRAVENOUS
  Administered 2013-03-23: 4 mg via INTRAVENOUS

## 2013-03-23 MED ORDER — DEXAMETHASONE 4 MG PO TABS
4.0000 mg | ORAL_TABLET | Freq: Four times a day (QID) | ORAL | Status: DC
  Administered 2013-03-24 (×2): 4 mg via ORAL
  Filled 2013-03-23 (×5): qty 1

## 2013-03-23 MED ORDER — POTASSIUM CHLORIDE IN NACL 20-0.9 MEQ/L-% IV SOLN
INTRAVENOUS | Status: DC
  Filled 2013-03-23 (×3): qty 1000

## 2013-03-23 MED ORDER — PHENOL 1.4 % MT LIQD: 1.0000 | OROMUCOSAL | Status: DC | PRN

## 2013-03-23 MED ORDER — DEXAMETHASONE SODIUM PHOSPHATE 4 MG/ML IJ SOLN
4.0000 mg | Freq: Four times a day (QID) | INTRAMUSCULAR | Status: DC
  Administered 2013-03-23 – 2013-03-24 (×2): 4 mg via INTRAVENOUS
  Filled 2013-03-23 (×6): qty 1

## 2013-03-23 MED ORDER — MIDAZOLAM HCL 2 MG/2ML IJ SOLN
0.5000 mg | Freq: Once | INTRAMUSCULAR | Status: AC | PRN
  Administered 2013-03-23 (×2): 0.5 mg via INTRAVENOUS

## 2013-03-23 MED ORDER — ALBUTEROL SULFATE HFA 108 (90 BASE) MCG/ACT IN AERS: 2.0000 | INHALATION_SPRAY | Freq: Four times a day (QID) | RESPIRATORY_TRACT | Status: DC | PRN

## 2013-03-23 MED ORDER — THROMBIN 5000 UNITS EX SOLR
CUTANEOUS | Status: DC | PRN
  Administered 2013-03-23 (×2): 5000 [IU] via TOPICAL

## 2013-03-23 MED ORDER — MENTHOL 3 MG MT LOZG: 1.0000 | LOZENGE | OROMUCOSAL | Status: DC | PRN

## 2013-03-23 MED ORDER — OXYCODONE HCL 5 MG/5ML PO SOLN: 5.0000 mg | Freq: Once | ORAL | Status: AC | PRN

## 2013-03-23 MED ORDER — ONDANSETRON HCL 4 MG/2ML IJ SOLN
INTRAMUSCULAR | Status: DC | PRN
  Administered 2013-03-23: 4 mg via INTRAVENOUS

## 2013-03-23 MED ORDER — GABAPENTIN 300 MG PO CAPS
300.0000 mg | ORAL_CAPSULE | Freq: Three times a day (TID) | ORAL | Status: DC
  Administered 2013-03-23 – 2013-03-24 (×2): 300 mg via ORAL
  Filled 2013-03-23 (×4): qty 1

## 2013-03-23 MED ORDER — MORPHINE SULFATE 2 MG/ML IJ SOLN
1.0000 mg | INTRAMUSCULAR | Status: DC | PRN
  Administered 2013-03-23 – 2013-03-24 (×3): 2 mg via INTRAVENOUS
  Filled 2013-03-23 (×3): qty 1

## 2013-03-23 MED ORDER — HYDROMORPHONE HCL PF 1 MG/ML IJ SOLN
INTRAMUSCULAR | Status: AC
  Filled 2013-03-23: qty 1

## 2013-03-23 MED ORDER — ACETAMINOPHEN 325 MG PO TABS: 650.0000 mg | ORAL_TABLET | ORAL | Status: DC | PRN

## 2013-03-23 MED ORDER — ACETAMINOPHEN 650 MG RE SUPP: 650.0000 mg | RECTAL | Status: DC | PRN

## 2013-03-23 MED ORDER — HYDROMORPHONE HCL PF 1 MG/ML IJ SOLN
INTRAMUSCULAR | Status: AC
  Administered 2013-03-23: 0.5 mg via INTRAVENOUS
  Filled 2013-03-23: qty 1

## 2013-03-23 MED ORDER — HYDROMORPHONE HCL PF 1 MG/ML IJ SOLN
0.2500 mg | INTRAMUSCULAR | Status: DC | PRN
  Administered 2013-03-23 (×4): 0.5 mg via INTRAVENOUS

## 2013-03-23 MED ORDER — OXYCODONE HCL 5 MG PO TABS
10.0000 mg | ORAL_TABLET | ORAL | Status: DC | PRN
  Administered 2013-03-23 – 2013-03-24 (×3): 10 mg via ORAL
  Filled 2013-03-23 (×3): qty 2

## 2013-03-23 MED ORDER — BUPIVACAINE HCL (PF) 0.25 % IJ SOLN
INTRAMUSCULAR | Status: DC | PRN
  Administered 2013-03-23: 3 mL

## 2013-03-23 MED ORDER — PROMETHAZINE HCL 25 MG/ML IJ SOLN: 6.2500 mg | INTRAMUSCULAR | Status: DC | PRN

## 2013-03-23 MED ORDER — ARTIFICIAL TEARS OP OINT
TOPICAL_OINTMENT | OPHTHALMIC | Status: DC | PRN
  Administered 2013-03-23: 1 via OPHTHALMIC

## 2013-03-23 MED ORDER — CLONIDINE HCL 0.1 MG PO TABS
0.1000 mg | ORAL_TABLET | Freq: Two times a day (BID) | ORAL | Status: DC
  Administered 2013-03-23 – 2013-03-24 (×2): 0.1 mg via ORAL
  Filled 2013-03-23 (×3): qty 1

## 2013-03-23 MED ORDER — LACTATED RINGERS IV SOLN
INTRAVENOUS | Status: DC | PRN
  Administered 2013-03-23 (×2): via INTRAVENOUS

## 2013-03-23 MED ORDER — HEMOSTATIC AGENTS (NO CHARGE) OPTIME
TOPICAL | Status: DC | PRN
  Administered 2013-03-23: 1 via TOPICAL

## 2013-03-23 MED ORDER — MIDAZOLAM HCL 5 MG/5ML IJ SOLN
INTRAMUSCULAR | Status: DC | PRN
  Administered 2013-03-23: 2 mg via INTRAVENOUS

## 2013-03-23 MED ORDER — SODIUM CHLORIDE 0.9 % IJ SOLN: 3.0000 mL | INTRAMUSCULAR | Status: DC | PRN

## 2013-03-23 MED ORDER — METHOCARBAMOL 500 MG PO TABS
500.0000 mg | ORAL_TABLET | Freq: Four times a day (QID) | ORAL | Status: DC | PRN
  Administered 2013-03-23 – 2013-03-24 (×2): 500 mg via ORAL
  Filled 2013-03-23 (×2): qty 1

## 2013-03-23 MED ORDER — AMLODIPINE BESYLATE 10 MG PO TABS
10.0000 mg | ORAL_TABLET | Freq: Every day | ORAL | Status: DC
  Administered 2013-03-24: 10 mg via ORAL
  Filled 2013-03-23: qty 1

## 2013-03-23 MED ORDER — LACTATED RINGERS IV SOLN
INTRAVENOUS | Status: DC
  Administered 2013-03-23: 13:00:00 via INTRAVENOUS

## 2013-03-23 MED ORDER — CEFAZOLIN SODIUM-DEXTROSE 2-3 GM-% IV SOLR
INTRAVENOUS | Status: AC
  Filled 2013-03-23: qty 50

## 2013-03-23 MED ORDER — PANTOPRAZOLE SODIUM 40 MG PO TBEC: 40.0000 mg | DELAYED_RELEASE_TABLET | Freq: Every day | ORAL | Status: DC

## 2013-03-23 SURGICAL SUPPLY — 52 items
ADH SKN CLS LQ APL DERMABOND (GAUZE/BANDAGES/DRESSINGS) ×1
APL SKNCLS STERI-STRIP NONHPOA (GAUZE/BANDAGES/DRESSINGS) ×1
APL SRG 60D 8 XTD TIP BNDBL (TIP) ×1
BAG DECANTER FOR FLEXI CONT (MISCELLANEOUS) ×2 IMPLANT
BENZOIN TINCTURE PRP APPL 2/3 (GAUZE/BANDAGES/DRESSINGS) ×2 IMPLANT
BUR MATCHSTICK NEURO 3.0 LAGG (BURR) ×2 IMPLANT
CANISTER SUCT 3000ML (MISCELLANEOUS) ×2 IMPLANT
CONT SPEC 4OZ CLIKSEAL STRL BL (MISCELLANEOUS) ×2 IMPLANT
DERMABOND ADHESIVE PROPEN (GAUZE/BANDAGES/DRESSINGS) ×1
DERMABOND ADVANCED .7 DNX6 (GAUZE/BANDAGES/DRESSINGS) IMPLANT
DRAPE LAPAROTOMY 100X72X124 (DRAPES) ×2 IMPLANT
DRAPE MICROSCOPE ZEISS OPMI (DRAPES) ×2 IMPLANT
DRAPE POUCH INSTRU U-SHP 10X18 (DRAPES) ×2 IMPLANT
DRAPE SURG 17X23 STRL (DRAPES) ×2 IMPLANT
DRESSING TELFA 8X3 (GAUZE/BANDAGES/DRESSINGS) ×2 IMPLANT
DRSG OPSITE 4X5.5 SM (GAUZE/BANDAGES/DRESSINGS) ×2 IMPLANT
DRSG OPSITE POSTOP 3X4 (GAUZE/BANDAGES/DRESSINGS) ×1 IMPLANT
DURAPREP 26ML APPLICATOR (WOUND CARE) ×2 IMPLANT
DURASEAL APPLICATOR TIP (TIP) ×1 IMPLANT
DURASEAL SPINE SEALANT 3ML (MISCELLANEOUS) ×1 IMPLANT
ELECT REM PT RETURN 9FT ADLT (ELECTROSURGICAL) ×2
ELECTRODE REM PT RTRN 9FT ADLT (ELECTROSURGICAL) ×1 IMPLANT
GAUZE SPONGE 4X4 16PLY XRAY LF (GAUZE/BANDAGES/DRESSINGS) IMPLANT
GLOVE BIO SURGEON STRL SZ8 (GLOVE) ×2 IMPLANT
GLOVE BIO SURGEON STRL SZ8.5 (GLOVE) ×1 IMPLANT
GLOVE SS BIOGEL STRL SZ 8 (GLOVE) IMPLANT
GLOVE SUPERSENSE BIOGEL SZ 8 (GLOVE) ×1
GOWN BRE IMP SLV AUR LG STRL (GOWN DISPOSABLE) IMPLANT
GOWN BRE IMP SLV AUR XL STRL (GOWN DISPOSABLE) ×5 IMPLANT
GOWN STRL REIN 2XL LVL4 (GOWN DISPOSABLE) IMPLANT
HEMOSTAT POWDER KIT SURGIFOAM (HEMOSTASIS) IMPLANT
KIT BASIN OR (CUSTOM PROCEDURE TRAY) ×2 IMPLANT
KIT ROOM TURNOVER OR (KITS) ×2 IMPLANT
NDL HYPO 25X1 1.5 SAFETY (NEEDLE) ×1 IMPLANT
NDL SPNL 20GX3.5 QUINCKE YW (NEEDLE) IMPLANT
NEEDLE HYPO 25X1 1.5 SAFETY (NEEDLE) ×2 IMPLANT
NEEDLE SPNL 20GX3.5 QUINCKE YW (NEEDLE) ×2 IMPLANT
NS IRRIG 1000ML POUR BTL (IV SOLUTION) ×2 IMPLANT
PACK LAMINECTOMY NEURO (CUSTOM PROCEDURE TRAY) ×2 IMPLANT
PAD ARMBOARD 7.5X6 YLW CONV (MISCELLANEOUS) ×6 IMPLANT
RUBBERBAND STERILE (MISCELLANEOUS) ×4 IMPLANT
SPONGE SURGIFOAM ABS GEL SZ50 (HEMOSTASIS) ×2 IMPLANT
STRIP CLOSURE SKIN 1/2X4 (GAUZE/BANDAGES/DRESSINGS) ×2 IMPLANT
SUT VIC AB 0 CT1 18XCR BRD8 (SUTURE) ×1 IMPLANT
SUT VIC AB 0 CT1 8-18 (SUTURE) ×2
SUT VIC AB 2-0 CP2 18 (SUTURE) ×2 IMPLANT
SUT VIC AB 3-0 SH 8-18 (SUTURE) ×2 IMPLANT
SYR 20ML ECCENTRIC (SYRINGE) ×2 IMPLANT
TAPE STRIPS DRAPE STRL (GAUZE/BANDAGES/DRESSINGS) ×1 IMPLANT
TOWEL OR 17X24 6PK STRL BLUE (TOWEL DISPOSABLE) ×2 IMPLANT
TOWEL OR 17X26 10 PK STRL BLUE (TOWEL DISPOSABLE) ×2 IMPLANT
WATER STERILE IRR 1000ML POUR (IV SOLUTION) ×2 IMPLANT

## 2013-03-23 NOTE — Anesthesia Procedure Notes (Signed)
Procedure Name: Intubation Date/Time: 03/23/2013 2:11 PM Performed by: Wanita Chamberlain Pre-anesthesia Checklist: Emergency Drugs available, Patient identified, Timeout performed, Suction available and Patient being monitored Patient Re-evaluated:Patient Re-evaluated prior to inductionOxygen Delivery Method: Circle system utilized Preoxygenation: Pre-oxygenation with 100% oxygen Intubation Type: IV induction Ventilation: Mask ventilation without difficulty Laryngoscope Size: Mac and 3 Grade View: Grade I Tube type: Oral Tube size: 7.5 mm Number of attempts: 1 Airway Equipment and Method: Stylet Placement Confirmation: ETT inserted through vocal cords under direct vision,  positive ETCO2 and breath sounds checked- equal and bilateral Secured at: 21 cm Tube secured with: Tape Dental Injury: Teeth and Oropharynx as per pre-operative assessment

## 2013-03-23 NOTE — H&P (Signed)
Subjective: Patient is a 62 y.o. female admitted for L2-3 microdiskectomy. Onset of symptoms was several weeks ago, gradually worsening since that time.  The pain is rated intense, and is located at the across the lower back and radiates to LLE. The pain is described as aching and occurs all day. The symptoms have been progressive. Symptoms are exacerbated by exercise. MRI or CT showed large HNP L2-3.   Past Medical History  Diagnosis Date  . GERD (gastroesophageal reflux disease)   . Hepatitis C   . Hypertension   . Low back pain   . Uterine fibroid   . Normocytic anemia     With thrombocytosis  . Right ureteral stone 2002  . Atelectasis 2002    Bilateral  . Bone spur 2008    Right calcaneal foot spur  . Lumbar spinal stenosis     S/P lumbar decompressive laminectomy, fusion and plating for lumbar spinal stensosis  . Hot flashes   . Wears dentures     top  . Hx of radiation therapy 2005    left breast  . 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 Autumn Messing; S/P re-excision of superior and inferior margins 10/27/2012.   . Osteoarthritis   . Shortness of breath     from pain  . Breast cancer 2004    Ductal carcinoma in situ of the left breast; S/P left partial mastectomy 02/26/2003; S/P re-excision of cranial and lateral margins11/18/2004.radiation  . Breast cancer 09/21/2012    right breast/ last radiation treatment 03/22/2013    Past Surgical History  Procedure Laterality Date  . Laminectomy  05/27/2009    Lumbar decompressive laminectomy, fusion and plating for lumbar spinal stensosis  . Breast lumpectomy Left 2011  . Mastectomy, partial Left 02/26/2003    S/P left partial mastectomy 02/26/2003; S/P re-excision of cranial and lateral margins11/18/2004.   . Breast lumpectomy with needle localization and axillary sentinel lymph node bx Right 10/13/2012    Procedure: BREAST LUMPECTOMY WITH NEEDLE LOCALIZATION;  Surgeon:  [name], MD;  Location: [facility];  Service: General;  Laterality: Right;  Right breast wire localized lumpectomy  . Re-excision of breast cancer,superior margins Right 10/27/2012    Procedure: RE-EXCISION OF BREAST CANCER,SUPERIOR and inferior MARGINS;  Surgeon: [name], MD;  Location: [facility];  Service: General;  Laterality: Right;  . Back surgery      Prior to Admission medications   Medication Sig Start Date End Date Taking? Authorizing Provider  albuterol (PROVENTIL HFA;VENTOLIN HFA) 108 (90 BASE) MCG/ACT inhaler Inhale 2 puffs into the lungs every 6 (six) hours as needed for wheezing.   Yes Historical Provider, MD  amLODipine (NORVASC) 10 MG tablet Take 10 mg by mouth daily. 11/16/12  Yes Axel Filler, MD  aspirin 325 MG EC tablet Take 325 mg by mouth daily.   Yes Historical Provider, MD  B Complex Vitamins (VITAMIN B COMPLEX PO) Take 1 tablet by mouth daily.    Yes Historical Provider, MD  cetirizine (ZYRTEC) 10 MG tablet Take 10 mg by mouth daily.   Yes Historical Provider, MD  cloNIDine (CATAPRES) 0.1 MG tablet Take 0.1 mg by mouth 2 (two) times daily.   Yes Historical Provider, MD  famotidine (PEPCID) 20 MG tablet Take 20 mg by mouth at bedtime.   Yes Historical Provider, MD  gabapentin (NEURONTIN) 300 MG capsule Take 1 capsule (300 mg total) by  mouth 3 (three) times daily. 03/21/13  Yes Carlisle Cater, PA-C  losartan (COZAAR) 25 MG tablet Take 25 mg by mouth daily.   Yes Historical Provider, MD  Multiple Vitamin (MULTIVITAMIN WITH MINERALS) TABS Take 1 tablet by mouth daily.   Yes Historical Provider, MD  omeprazole (PRILOSEC) 40 MG capsule Take 40 mg by mouth daily.   Yes Historical Provider, MD  Oxycodone HCl 10 MG TABS Take 10 mg by mouth every 3 (three) hours as needed (for pain).   Yes Historical Provider, MD  Oxycodone HCl 10 MG TABS Take 1 tablet (10 mg total) by mouth every 4 (four) hours. 03/21/13  Yes Carlisle Cater, PA-C  traMADol (ULTRAM) 50  MG tablet Take 50 mg by mouth every 6 (six) hours as needed for pain.  02/25/13  Yes Historical Provider, MD  zolpidem (AMBIEN) 5 MG tablet Take 5 mg by mouth at bedtime.   Yes Historical Provider, MD   Allergies  Allergen Reactions  . Shrimp [Shellfish Allergy] Shortness Of Breath  . Lisinopril Itching and Cough  . Vicodin [Hydrocodone-Acetaminophen] Itching and Nausea And Vomiting    This is patient's home medication    History  Substance Use Topics  . Smoking status: Current Some Day Smoker -- 0.20 packs/day for 34 years    Types: Cigarettes  . Smokeless tobacco: Never Used  . Alcohol Use: 1.2 oz/week    2 Cans of beer per week     Comment: 02/27/2013 "couple beers q weekend"    Family History  Problem Relation Age of Onset  . Colon cancer Mother 1  . Hypertension Mother   . Diabetes Sister 52  . Hypertension Sister   . Breast cancer Neg Hx   . Cervical cancer Neg Hx   . Diabetes Brother   . Hypertension Brother   . Diabetes Brother   . Hypertension Brother   . Kidney disease Son     On dialysis  . Hypertension Son   . Diabetes Son   . Multiple sclerosis Son   . Bone cancer Sister 42     Review of Systems  Positive ROS: neg  All other systems have been reviewed and were otherwise negative with the exception of those mentioned in the HPI and as above.  Objective: Vital signs in last 24 hours: Temp:  [98.1 F (36.7 C)] 98.1 F (36.7 C) (10/23 1312) Pulse Rate:  [69] 69 (10/23 1311) Resp:  [16] 16 (10/23 1311) BP: (163)/(106) 163/106 mmHg (10/23 1311) SpO2:  [100 %] 100 % (10/23 1311)  General Appearance: Alert, cooperative, distressed by pain, looks miserable, appears stated age Head: Normocephalic, without obvious abnormality, atraumatic Eyes: PERRL, conjunctiva/corneas clear, EOM's intact    Neck: Supple, symmetrical, trachea midline Back: Symmetric, no curvature, ROM normal, no CVA tenderness Lungs:  respirations unlabored Heart: Regular rate and  rhythm Abdomen: Soft, non-tender Extremities: Extremities normal, atraumatic, no cyanosis or edema Pulses: 2+ and symmetric all extremities Skin: Skin color, texture, turgor normal, no rashes or lesions  NEUROLOGIC:   Mental status: Alert and oriented x4,  no aphasia, good attention span, fund of knowledge, and memory Motor Exam - grossly normal Sensory Exam - grossly normal Reflexes: 1+ Coordination - grossly normal Gait - grossly normal Balance - grossly normal Cranial Nerves: I: smell Not tested  II: visual acuity  OS: nl    OD: nl  II: visual fields Full to confrontation  II: pupils Equal, round, reactive to light  III,VII: ptosis None  III,IV,VI: extraocular  muscles  Full ROM  V: mastication Normal  V: facial light touch sensation  Normal  V,VII: corneal reflex  Present  VII: facial muscle function - upper  Normal  VII: facial muscle function - lower Normal  VIII: hearing Not tested  IX: soft palate elevation  Normal  IX,X: gag reflex Present  XI: trapezius strength  5/5  XI: sternocleidomastoid strength 5/5  XI: neck flexion strength  5/5  XII: tongue strength  Normal    Data Review Lab Results  Component Value Date   WBC 6.5 03/21/2013   HGB 13.5 03/21/2013   HCT 40.0 03/21/2013   MCV 89.7 03/21/2013   PLT 431* 03/21/2013   Lab Results  Component Value Date   NA 142 03/21/2013   K 3.4* 03/21/2013   CL 104 03/21/2013   CO2 25 03/21/2013   BUN 20 03/21/2013   CREATININE 1.53* 03/21/2013   GLUCOSE 96 03/21/2013   Lab Results  Component Value Date   INR 0.87 03/21/2013    Assessment/Plan: Patient admitted for L2-3 microdiskectomy. Patient has failed a reasonable attempt at conservative therapy.  I explained the condition and procedure to the patient and answered any questions.  Patient wishes to proceed with procedure as planned. Understands risks/ benefits and typical outcomes of procedure.   Zykerria Tanton S 03/23/2013 1:45 PM

## 2013-03-23 NOTE — Transfer of Care (Signed)
Immediate Anesthesia Transfer of Care Note  Patient: Monique Henderson  Procedure(s) Performed: Procedure(s) with comments: LUMBAR LAMINECTOMY/DECOMPRESSION MICRODISCECTOMY 1 LEVEL (Left) - LUMBAR LAMINECTOMY/DECOMPRESSION MICRODISCECTOMY 1 LEVEL  Patient Location: PACU  Anesthesia Type:General  Level of Consciousness: awake, alert  and pateint uncooperative  Airway & Oxygen Therapy: Patient Spontanous Breathing and Patient connected to nasal cannula oxygen  Post-op Assessment: Report given to PACU RN, Post -op Vital signs reviewed and stable and Patient moving all extremities  Post vital signs: Reviewed and stable  Complications: No apparent anesthesia complications

## 2013-03-23 NOTE — Op Note (Signed)
03/23/2013  3:21 PM  PATIENT:  Monique Henderson  62 y.o. 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 Arnoldo Morale  ANESTHESIA:   General  EBL: Less than 25 ml  Total I/O In: 1000 [I.V.:1000] Out: -   BLOOD ADMINISTERED:none  DRAINS: None   SPECIMEN:  No Specimen  INDICATION FOR PROCEDURE: This patient presented with severe left leg pain in an L3 distribution. MRI showed a large herniated disc with a large inferior free fragment compressing the left L3 nerve root. I recommended a microdiscectomy in hopes of improvement of her pain. Patient understood the risks, benefits, and alternatives and potential outcomes and wished to proceed.  PROCEDURE DETAILS: The patient was taken to the operating room and after induction of adequate generalized endotracheal anesthesia, the patient was rolled into the prone position on the Wilson frame and all pressure points were padded. The lumbar region was cleaned and then prepped with DuraPrep and draped in the usual sterile fashion. 5 cc of local anesthesia was injected and then a dorsal midline incision was made and carried down to the lumbo sacral fascia. The fascia was opened and the paraspinous musculature was taken down in a subperiosteal fashion to expose L2-3 on the left. Intraoperative x-ray confirmed my level, and then I used a combination of the high-speed drill and the Kerrison punches to perform a hemilaminectomy, medial facetectomy, and foraminotomy at L2-3 on the left. The underlying yellow ligament was opened and removed in a piecemeal fashion to expose the underlying dura and exiting nerve root. I undercut the lateral recess and dissected down until I was medial to and distal to the pedicle. The nerve root  was well decompressed. We then gently retracted the nerve root medially with a retractor, coagulated the epidural venous vasculature, and found a large inferior free fragment. This was removed with I nerve hook and up to 2 rongeur. Several fragments were removed. I was then able to palpate the pedicle and left distal to the pedicle. I found no more free fragments. The nerve root passed easily. There was an annular bulge but I did not feel I needed to perform an intradiscal discectomy. Therefore the annulus was not incised. I then palpated with a coronary dilator along the nerve root and into the foramen to assure adequate decompression. I felt no more compression of the nerve root. I irrigated with saline solution containing bacitracin. Achieved hemostasis with bipolar cautery, lined the dura with Gelfoam, and then closed the fascia with 0 Vicryl. I closed the subcutaneous tissues with 2-0 Vicryl and the subcuticular tissues with 3-0 Vicryl. The skin was then closed with benzoin and Steri-Strips. The drapes were removed, a sterile dressing was applied. The patient was awakened from general anesthesia and transferred to the recovery room in stable condition. At the end of the procedure all sponge, needle and instrument counts were correct.   PLAN OF CARE: Admit for overnight observation  PATIENT DISPOSITION:  PACU - hemodynamically stable.   Delay start of Pharmacological VTE agent (>24hrs) due to surgical blood loss or risk of bleeding:  yes

## 2013-03-23 NOTE — Anesthesia Postprocedure Evaluation (Signed)
  Anesthesia Post-op Note  Patient: Monique Henderson  Procedure(s) Performed: Procedure(s) with comments: LUMBAR LAMINECTOMY/DECOMPRESSION MICRODISCECTOMY 1 LEVEL (Left) - LUMBAR LAMINECTOMY/DECOMPRESSION MICRODISCECTOMY 1 LEVEL  Patient Location: PACU  Anesthesia Type:General  Level of Consciousness: awake, alert , oriented and patient cooperative  Airway and Oxygen Therapy: Patient Spontanous Breathing and Patient connected to nasal cannula oxygen  Post-op Pain: none  Post-op Assessment: Post-op Vital signs reviewed, Patient's Cardiovascular Status Stable, Respiratory Function Stable, Patent Airway, No signs of Nausea or vomiting and Pain level controlled  Post-op Vital Signs: Reviewed and stable  Complications: No apparent anesthesia complications

## 2013-03-23 NOTE — Anesthesia Preprocedure Evaluation (Addendum)
Anesthesia Evaluation  Patient identified by MRN, date of birth, ID band Patient awake    Reviewed: Allergy & Precautions, H&P , NPO status , Patient's Chart, lab work & pertinent test results  History of Anesthesia Complications Negative for: history of anesthetic complications  Airway Mallampati: II TM Distance: >3 FB Neck ROM: Full    Dental  (+) Dental Advisory Given, Edentulous Upper, Missing and Poor Dentition   Pulmonary Current Smoker,  07-12-12  Chest x-ray Findings: Stable mild cardiomegaly.  Slightly tortuous thoracic aorta contour.  Hilar contours normal.  The trachea is midline. The lungs are normally expanded and clear.  The pulmonary vascularity is normal.  There is no pleural effusion, airspace disease, or pneumothorax.  There are typical degenerative changes of the thoracic spine.  No acute or suspicious bony abnormality.   breath sounds clear to auscultation  Pulmonary exam normal       Cardiovascular Exercise Tolerance: Good hypertension, Pt. on medications Rhythm:Regular Rate:Normal  12-Oct-2012 12:30:18 [facility] ROUTINE RECORD Sinus bradycardia Possible Left atrial enlargement Septal infarct , age undetermined Abnormal ECG   Neuro/Psych PSYCHIATRIC DISORDERS Anxiety Chronic back pain: narcotics daily    GI/Hepatic GERD-  Medicated and Controlled,(+)     substance abuse (pt very agitated, denies any drug use since 80's, Dr. Ronnald Ramp aware of patient's pos cocaine screen 9/14,  wishes to proceed, as does pt)  cocaine use, Hepatitis -, C  Endo/Other  negative endocrine ROS  Renal/GU Renal InsufficiencyRenal disease     Musculoskeletal  (+) Arthritis -, Osteoarthritis,    Abdominal Normal abdominal exam  (+)   Peds  Hematology negative hematology ROS (+)   Anesthesia Other Findings   Reproductive/Obstetrics                      Anesthesia  Physical Anesthesia Plan  ASA: III  Anesthesia Plan: General   Post-op Pain Management:    Induction: Intravenous  Airway Management Planned: Oral ETT  Additional Equipment:   Intra-op Plan:   Post-operative Plan: Extubation in OR  Informed Consent: I have reviewed the patients History and Physical, chart, labs and discussed the procedure including the risks, benefits and alternatives for the proposed anesthesia with the patient or authorized representative who has indicated his/her understanding and acceptance.   Dental advisory given  Plan Discussed with: CRNA, Anesthesiologist and Surgeon  Anesthesia Plan Comments: (Plan routine monitors, GETA)       Anesthesia Quick Evaluation

## 2013-03-23 NOTE — Preoperative (Addendum)
Beta Blockers   Reason not to administer Beta Blockers:Not Applicable 

## 2013-03-24 ENCOUNTER — Encounter (HOSPITAL_COMMUNITY): Payer: Self-pay | Admitting: Neurological Surgery

## 2013-03-24 DIAGNOSIS — M5126 Other intervertebral disc displacement, lumbar region: Secondary | ICD-10-CM | POA: Diagnosis not present

## 2013-03-24 MED ORDER — OXYCODONE HCL 10 MG PO TABS: 10.0000 mg | ORAL_TABLET | ORAL | Status: DC

## 2013-03-24 NOTE — Progress Notes (Signed)
Pt. Alert and oriented,follows simple instructions, denies pain. Incision area without swelling, redness or S/S of infection. Voiding adequate clear yellow urine. Moving all extremities well and vitals stable and documented. Patient discharged home with family.  Lumbar surgery notes instructions given to patient and family member for home safety and precautions. Pt. and family stated understanding of instructions given

## 2013-03-24 NOTE — Plan of Care (Signed)
Problem: Consults Goal: Diagnosis - Spinal Surgery Outcome: Completed/Met Date Met:  03/24/13 Microdiscectomy

## 2013-03-24 NOTE — Discharge Summary (Signed)
Physician Discharge Summary  Patient ID: Monique Henderson MRN: [id number] DOB/AGE: July 13, 1950 62 y.o.  Admit date: 03/23/2013 Discharge date: 03/24/2013  Admission Diagnoses: L L2-3 HNP   Discharge Diagnoses: same   Discharged Condition: good  Hospital Course: The patient was admitted on 03/23/2013 and taken to the operating room where the patient underwent L L2-3 Microdiskectomy. The patient tolerated the procedure well and was taken to the recovery room and then to the floor in stable condition. The hospital course was routine. There were no complications. The wound remained clean dry and intact. Pt had appropriate back soreness. No complaints of leg pain or new N/T/W. The patient remained afebrile with stable vital signs, and tolerated a regular diet. The patient continued to increase activities, and pain was well controlled with oral pain medications.   Consults: None  Significant Diagnostic Studies:  Results for orders placed during the hospital encounter of 03/21/13  SURGICAL PCR SCREEN      Result Value Range   MRSA, PCR NEGATIVE  NEGATIVE   Staphylococcus aureus NEGATIVE  NEGATIVE  PROTIME-INR      Result Value Range   Prothrombin Time 11.7  11.6 - 15.2 seconds   INR 0.87  0.00 - 99991111  BASIC METABOLIC PANEL      Result Value Range   Sodium 142  135 - 145 mEq/L   Potassium 3.4 (*) 3.5 - 5.1 mEq/L   Chloride 104  96 - 112 mEq/L   CO2 25  19 - 32 mEq/L   Glucose, Bld 96  70 - 99 mg/dL   BUN 20  6 - 23 mg/dL   Creatinine, Ser 1.53 (*) 0.50 - 1.10 mg/dL   Calcium 9.7  8.4 - 10.5 mg/dL   GFR calc non Af Amer 36 (*) >90 mL/min   GFR calc Af Amer 42 (*) >90 mL/min  CBC WITH DIFFERENTIAL      Result Value Range   WBC 6.5  4.0 - 10.5 K/uL   RBC 4.46  3.87 - 5.11 MIL/uL   Hemoglobin 13.5  12.0 - 15.0 g/dL   HCT 40.0  36.0 - 46.0 %   MCV 89.7  78.0 - 100.0 fL   MCH 30.3  26.0 - 34.0 pg   MCHC 33.8  30.0 - 36.0 g/dL   RDW 14.0  11.5 - 15.5 %   Platelets 431 (*) 150 -  400 K/uL   Neutrophils Relative % 69  43 - 77 %   Neutro Abs 4.5  1.7 - 7.7 K/uL   Lymphocytes Relative 18  12 - 46 %   Lymphs Abs 1.2  0.7 - 4.0 K/uL   Monocytes Relative 10  3 - 12 %   Monocytes Absolute 0.7  0.1 - 1.0 K/uL   Eosinophils Relative 2  0 - 5 %   Eosinophils Absolute 0.1  0.0 - 0.7 K/uL   Basophils Relative 1  0 - 1 %   Basophils Absolute 0.0  0.0 - 0.1 K/uL    Dg Lumbar Spine 2-3 Views  03/23/2013   CLINICAL DATA:  Microdiscectomy L2-3  EXAM: LUMBAR SPINE - 2-3 VIEW  COMPARISON:  MRI 03/01/2013  FINDINGS: Posterior surgical instruments on film 1 are directed at the L3-4 level. On film 2, posterior surgical instruments are directed at the L2-3 level.  IMPRESSION: Intraoperative localization as above.   Electronically Signed   By: Rolm Baptise M.D.   On: 03/23/2013 16:09   Mr Lumbar Spine Wo Contrast  03/01/2013  CLINICAL DATA:  Severe back in left hip pain. Left-sided radiculopathy.  EXAM: MRI LUMBAR SPINE WITHOUT CONTRAST  TECHNIQUE: Multiplanar, multisequence MR imaging was performed. No intravenous contrast was administered.  COMPARISON:  Myelogram 04/22/2011.  FINDINGS: Numbering convention used on prior exam is preserved. Bone marrow signal shows heterogenous marrow. This is a nonspecific finding most commonly associated with obesity, anemia, cigarette smoking or chronic disease. Trace retrolisthesis of L2 on L3 is new compared to the prior exam and associated with progressive disk collapse which we discussed further below. Probable fibroid uterus. Spinous process clamp is present at L4-L5. Discogenic endplate edema is present at L2-L3. Vertebral body height is preserved. Spinal cord terminates dorsal to the L1 vertebra. Mild levoconvex curvature is present with the apex at L4.  T12-L1 and L1-L2 levels appear normal.  L2-L3: Progressive disk degeneration and desiccation with broad-based posterior disc protrusion and sequestered disc fragment filling of the left lateral recess  dorsal to L3. This sequestered fragment measures 11 mm x 10 mm on axial imaging and 11 mm craniocaudal on sagittal imaging. At the level of the disc, central stenosis is moderate. Mild left foraminal stenosis. Right foramen patent.  L3-L4: Moderate central stenosis appears unchanged, associated with broad-based disc bulging. Circumferential disc bulging is present. There is right greater than left lateral recess encroachment potentially affecting the descending L4 nerves. Mild symmetric bilateral foraminal stenosis associated with facet hypertrophy and bulging disc.  L4-L5: Posterior spinous process clamp. Degenerated disk. Central canal and lateral recesses appear adequately patent. Moderate right foraminal stenosis. Mild left foraminal stenosis.  L5-S1: Disk degeneration with broad-based protrusion. There is right greater than left lateral recess stenosis associated with right eccentric broad-based disc protrusion. The disk is severely degenerated and protruded disc extends into both neural foramina. There is moderate bilateral foraminal stenosis. Compared to the prior CT myelogram, this appears unchanged. Central canal appears adequately patent.  IMPRESSION: 1. Progressive L2-L3 severe degenerative disc disease with new sequestered fragment dorsal to the L3 vertebra measuring 11 mm x 10 mm x 11 mm. This fills the lateral recess and compresses the descending left L3 nerve. There is moderate central stenosis at and below the level of the disc. 2. Postoperative changes of L4-L5 with spinous process clamp. Unchanged moderate left foraminal stenosis. 3. Other levels appear similar to the prior CT myelogram of 04/22/2011. Findings discussed with the patient's clinical team at 1445 hr.   Electronically Signed   By: Dereck Ligas M.D.   On: 03/01/2013 14:59    Antibiotics:  Anti-infectives   Start     Dose/Rate Route Frequency Ordered Stop   03/23/13 2200  ceFAZolin (ANCEF) IVPB 1 g/50 mL premix     1 g 100  mL/hr over 30 Minutes Intravenous Every 8 hours 03/23/13 1906 03/24/13 0607   03/23/13 1439  bacitracin 50,000 Units in sodium chloride irrigation 0.9 % 500 mL irrigation  Status:  Discontinued       As needed 03/23/13 1440 03/23/13 1557   03/23/13 1403  ceFAZolin (ANCEF) 2-3 GM-% IVPB SOLR  Status:  Discontinued    Comments:  Sauve, Robin   : cabinet override      03/23/13 1403 03/23/13 1623   03/23/13 0600  ceFAZolin (ANCEF) IVPB 2 g/50 mL premix     2 g 100 mL/hr over 30 Minutes Intravenous On call to O.R. 03/22/13 1400 03/23/13 1412      Discharge Exam: Blood pressure 146/85, pulse 65, temperature 98.6 F (37 C), temperature source Oral,  resp. rate 18, SpO2 97.00%. Neurologic: Grossly normal Incision CDI  Discharge Medications:     Medication List    STOP taking these medications       traMADol 50 MG tablet  Commonly known as:  ULTRAM      TAKE these medications       albuterol 108 (90 BASE) MCG/ACT inhaler  Commonly known as:  PROVENTIL HFA;VENTOLIN HFA  Inhale 2 puffs into the lungs every 6 (six) hours as needed for wheezing.     amLODipine 10 MG tablet  Commonly known as:  NORVASC  Take 10 mg by mouth daily.     aspirin 325 MG EC tablet  Take 325 mg by mouth daily.     cetirizine 10 MG tablet  Commonly known as:  ZYRTEC  Take 10 mg by mouth daily.     cloNIDine 0.1 MG tablet  Commonly known as:  CATAPRES  Take 0.1 mg by mouth 2 (two) times daily.     famotidine 20 MG tablet  Commonly known as:  PEPCID  Take 20 mg by mouth at bedtime.     gabapentin 300 MG capsule  Commonly known as:  NEURONTIN  Take 1 capsule (300 mg total) by mouth 3 (three) times daily.     losartan 25 MG tablet  Commonly known as:  COZAAR  Take 25 mg by mouth daily.     multivitamin with minerals Tabs tablet  Take 1 tablet by mouth daily.     omeprazole 40 MG capsule  Commonly known as:  PRILOSEC  Take 40 mg by mouth daily.     Oxycodone HCl 10 MG Tabs  Take 1 tablet (10  mg total) by mouth every 4 (four) hours. One pill every 4 hours as needed for pain     VITAMIN B COMPLEX PO  Take 1 tablet by mouth daily.     zolpidem 5 MG tablet  Commonly known as:  AMBIEN  Take 5 mg by mouth at bedtime.        Disposition: home   Final Dx: L L2-3 microdiskectomy      Discharge Orders   Future Appointments Provider Department Dept Phone   04/20/2013 10:00 AM Blair Promise, MD [place] [phone]   Future Orders Complete By Expires    Remove dressing in 72 hours  As directed    Call MD for:  difficulty breathing, headache or visual disturbances  As directed    Call MD for:  persistant nausea and vomiting  As directed    Call MD for:  redness, tenderness, or signs of infection (pain, swelling, redness, odor or green/yellow discharge around incision site)  As directed    Call MD for:  severe uncontrolled pain  As directed    Call MD for:  temperature >100.4  As directed    Diet - low sodium heart healthy  As directed    Discharge instructions  As directed    Comments:     No bending or twisting, no heavy lifting, may shower   Increase activity slowly  As directed       Follow-up Information   Follow up with Kayloni Rocco S, MD. Schedule an appointment as soon as possible for a visit in 2 weeks.   Specialty:  Neurosurgery   Contact information:   [address] [place] [address] [phone]        Signed: Eustace Moore 03/24/2013, 9:41 AM

## 2013-03-26 ENCOUNTER — Encounter: Payer: Self-pay | Admitting: Radiation Oncology

## 2013-03-26 NOTE — Progress Notes (Signed)
[facility] End of Treatment Note  Name:Monique Henderson  Date: 03/26/2013 [id number] DOB:Aug 12, 1950   Status:outpatient    CC: Axel Filler, MD  Dr. Autumn Messing III  REFERRING PHYSICIAN: Dr. 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 tolerated her treatment reasonably well but she missed numerous treatments during her course of therapy secondary to poor compliance and also low back pain for which she is scheduled for surgery. It took a total of 10 weeks to complete a 6 week course therapy. 2 additional treatments were added at the end of her prescribed treatment to increase the biological effective dose. She developed a dry desquamation by completion of therapy with slight regression of her organizing hematoma of the right breast.                         PLAN: Routine followup in one month. Patient instructed to call if questions or worsening complaints in interim.

## 2013-04-19 ENCOUNTER — Encounter: Payer: Self-pay | Admitting: *Deleted

## 2013-04-20 ENCOUNTER — Ambulatory Visit: Payer: Medicare Other | Admitting: Radiation Oncology

## 2013-04-20 ENCOUNTER — Telehealth: Payer: Self-pay | Admitting: *Deleted

## 2013-04-20 NOTE — Telephone Encounter (Signed)
Pt was 'no show' for FU today. Called and spoke w/her. She requests to reschedule. Put her call through to [facility], medical secretary to reschedule pt for FU. Called Dr Valere Dross and informed him.

## 2013-04-25 ENCOUNTER — Ambulatory Visit: Payer: Medicare Other | Admitting: Radiation Oncology

## 2013-05-04 ENCOUNTER — Other Ambulatory Visit: Payer: Self-pay | Admitting: Internal Medicine

## 2013-05-05 NOTE — Telephone Encounter (Signed)
Triage has called pt and left a message for a rtc

## 2013-05-05 NOTE — Telephone Encounter (Signed)
I approved the zolpidem (Ambien) refill - nurse to call in.  Tramadol appears to have been discontinued when patient was discharged after back surgery in October; please find out if she is still taking tramadol.  She also needs a clinic appointment.

## 2013-05-08 ENCOUNTER — Other Ambulatory Visit: Payer: Self-pay | Admitting: *Deleted

## 2013-05-08 MED ORDER — ALBUTEROL SULFATE HFA 108 (90 BASE) MCG/ACT IN AERS: 2.0000 | INHALATION_SPRAY | Freq: Four times a day (QID) | RESPIRATORY_TRACT | Status: DC | PRN

## 2013-05-08 NOTE — Telephone Encounter (Signed)
Tramadol appears to have been discontinued when patient was discharged after back surgery in October; please find out if she is still taking tramadol.

## 2013-05-09 NOTE — Telephone Encounter (Signed)
Tramadol and Ambien rxs called to [facility].

## 2013-05-09 NOTE — Telephone Encounter (Signed)
Refill approved - nurse to call in. 

## 2013-05-09 NOTE — Telephone Encounter (Signed)
Pt stated she was not told to stop taking Tramadol after her back surgery. Stated she still need Tramadol for the back pain.

## 2013-05-11 ENCOUNTER — Other Ambulatory Visit: Payer: Self-pay | Admitting: Internal Medicine

## 2013-05-16 ENCOUNTER — Ambulatory Visit: Payer: Medicare Other | Admitting: Radiation Oncology

## 2013-06-09 ENCOUNTER — Other Ambulatory Visit: Payer: Self-pay | Admitting: Internal Medicine

## 2013-06-09 ENCOUNTER — Other Ambulatory Visit: Payer: Self-pay | Admitting: *Deleted

## 2013-06-09 MED ORDER — TRAMADOL HCL 50 MG PO TABS: 50.0000 mg | ORAL_TABLET | Freq: Four times a day (QID) | ORAL | Status: DC | PRN

## 2013-06-09 NOTE — Telephone Encounter (Signed)
Refill approved - nurse to call in.  Please schedule a follow-up appointment with me when available.

## 2013-06-09 NOTE — Telephone Encounter (Signed)
Tramadol rx called to [facility]. Message sent to front office to schedule pt an appt.

## 2013-06-10 ENCOUNTER — Other Ambulatory Visit: Payer: Self-pay | Admitting: Internal Medicine

## 2013-06-28 ENCOUNTER — Ambulatory Visit: Payer: Medicare Other | Admitting: Radiation Oncology

## 2013-06-30 ENCOUNTER — Other Ambulatory Visit: Payer: Self-pay | Admitting: *Deleted

## 2013-06-30 MED ORDER — AMLODIPINE BESYLATE 10 MG PO TABS: 10.0000 mg | ORAL_TABLET | Freq: Every day | ORAL | Status: DC

## 2013-07-10 DIAGNOSIS — M5126 Other intervertebral disc displacement, lumbar region: Secondary | ICD-10-CM | POA: Diagnosis not present

## 2013-07-10 DIAGNOSIS — I10 Essential (primary) hypertension: Secondary | ICD-10-CM | POA: Diagnosis not present

## 2013-07-11 ENCOUNTER — Ambulatory Visit
Admission: RE | Admit: 2013-07-11 | Discharge: 2013-07-11 | Disposition: A | Discharge status: Home / Self Care | Payer: Medicare Other | Source: Ambulatory Visit | Attending: Radiation Oncology | Admitting: Radiation Oncology

## 2013-07-11 ENCOUNTER — Encounter: Payer: Self-pay | Admitting: Radiation Oncology

## 2013-07-11 VITALS — BP 177/98 | HR 78 | Temp 97.7°F | Resp 20 | Wt 139.0 lb

## 2013-07-11 DIAGNOSIS — C50919 Malignant neoplasm of unspecified site of unspecified female breast: Secondary | ICD-10-CM

## 2013-07-11 DIAGNOSIS — C50319 Malignant neoplasm of lower-inner quadrant of unspecified female breast: Secondary | ICD-10-CM | POA: Diagnosis not present

## 2013-07-11 NOTE — Progress Notes (Signed)
Pt states her back pain is much improved since her surgery. She continues to take Oxycodone prn for pain in her left leg. She states the leg pain is improving. Pt denies other pain or fatigue.  Skin of her right breast treatment area is healed, still slightly darker.

## 2013-07-11 NOTE — Progress Notes (Signed)
CC: Dr. Bertha Stakes, Dr. Autumn Messing III  Followup note: Monique Henderson returns today approximately 4 months following completion of radiation therapy after conservative surgery for her DCIS of the right breast. She had poor compliance during her radiation therapy because of low back pain. She underwent a lumbar laminectomy after completion of radiation therapy in her pain is improved. She still takes occasional oxycodone for left lower extremity pain. She tells me that she does not have an appointment to see Dr. Marlou Starks. It is recalled that her DCIS was ER/PR negative.  Physical examination: Alert and oriented. Filed Vitals:   07/11/13 1542  BP: 177/98  Pulse: 78  Temp: 97.7 F (36.5 C)  Resp: 20   Head and neck examination: Grossly unremarkable. Nodes: Without palpable cervical, supraclavicular, or axillary lymphadenopathy.: Remains a 6 x 6 cm organized hematoma along the medial aspect of the right breast which has regressed slightly since last time I saw her 4 months ago. Otherwise no dominant masses are appreciated. Left breast without masses or lesions. Extremities: Without edema.  Impression: Satisfactory progress. Her diagnostic mammograms prior to her diagnosis were done in April 2014. I will go ahead and get her set up for mammography at the [facility] later this spring and then a followup visit with me in 6 months.  Plan: As discussed above.

## 2013-07-12 ENCOUNTER — Telehealth: Payer: Self-pay | Admitting: *Deleted

## 2013-07-12 NOTE — Telephone Encounter (Signed)
Called patient to inform of mammogram for 10-02-13 @ 9:30 am at [facility], spoke with patient and she is aware of this test.

## 2013-07-20 ENCOUNTER — Other Ambulatory Visit: Payer: Self-pay | Admitting: *Deleted

## 2013-07-20 DIAGNOSIS — I10 Essential (primary) hypertension: Secondary | ICD-10-CM | POA: Diagnosis not present

## 2013-07-21 MED ORDER — ZOLPIDEM TARTRATE 5 MG PO TABS: 5.0000 mg | ORAL_TABLET | Freq: Every evening | ORAL | Status: DC | PRN

## 2013-07-26 NOTE — Telephone Encounter (Signed)
Called to pharm 

## 2013-08-01 DIAGNOSIS — M5126 Other intervertebral disc displacement, lumbar region: Secondary | ICD-10-CM | POA: Diagnosis not present

## 2013-08-01 DIAGNOSIS — M47817 Spondylosis without myelopathy or radiculopathy, lumbosacral region: Secondary | ICD-10-CM | POA: Diagnosis not present

## 2013-08-16 ENCOUNTER — Other Ambulatory Visit: Payer: Self-pay | Admitting: *Deleted

## 2013-08-16 ENCOUNTER — Ambulatory Visit: Payer: Medicare Other | Admitting: Internal Medicine

## 2013-08-16 MED ORDER — FAMOTIDINE 20 MG PO TABS: 20.0000 mg | ORAL_TABLET | Freq: Every day | ORAL | Status: DC

## 2013-08-16 NOTE — Telephone Encounter (Signed)
Spoke w/ Monique Henderson, she has an appt with dr Marinda Elk in June and wants to know if she must wait until then to get zolpidem and oxycodone

## 2013-08-16 NOTE — Telephone Encounter (Signed)
I refilled the famotidine.  Patient needs an appointment to assess whether refills for the oxycodone and Ambien are appropriate.

## 2013-08-17 ENCOUNTER — Other Ambulatory Visit: Payer: Self-pay | Admitting: *Deleted

## 2013-08-17 NOTE — Telephone Encounter (Signed)
She had an appointment yesterday but canceled it.  Her last office visit was in October.  I would suggest an appointment in [facility] with a resident, and then that she see me when possible in my own clinic.  Thanks, Sonia Side

## 2013-08-18 MED ORDER — TRAMADOL HCL 50 MG PO TABS: 50.0000 mg | ORAL_TABLET | Freq: Four times a day (QID) | ORAL | Status: DC | PRN

## 2013-08-18 NOTE — Telephone Encounter (Signed)
Refill approved - nurse to call in. 

## 2013-08-18 NOTE — Telephone Encounter (Signed)
Rx called in 

## 2013-08-22 NOTE — Telephone Encounter (Signed)
Per dr Marinda Elk pt needs to be seen even by a resident

## 2013-08-25 DIAGNOSIS — M5126 Other intervertebral disc displacement, lumbar region: Secondary | ICD-10-CM | POA: Diagnosis not present

## 2013-08-25 DIAGNOSIS — I10 Essential (primary) hypertension: Secondary | ICD-10-CM | POA: Diagnosis not present

## 2013-08-28 ENCOUNTER — Encounter: Payer: Self-pay | Admitting: Internal Medicine

## 2013-08-28 ENCOUNTER — Ambulatory Visit (INDEPENDENT_AMBULATORY_CARE_PROVIDER_SITE_OTHER): Payer: Medicare Other | Admitting: Internal Medicine

## 2013-08-28 ENCOUNTER — Other Ambulatory Visit: Payer: Self-pay | Admitting: Neurological Surgery

## 2013-08-28 VITALS — BP 139/95 | HR 79 | Temp 97.2°F | Wt 136.9 lb

## 2013-08-28 DIAGNOSIS — D0510 Intraductal carcinoma in situ of unspecified breast: Secondary | ICD-10-CM

## 2013-08-28 DIAGNOSIS — N259 Disorder resulting from impaired renal tubular function, unspecified: Secondary | ICD-10-CM | POA: Diagnosis not present

## 2013-08-28 DIAGNOSIS — M545 Low back pain, unspecified: Secondary | ICD-10-CM | POA: Diagnosis not present

## 2013-08-28 DIAGNOSIS — N289 Disorder of kidney and ureter, unspecified: Secondary | ICD-10-CM

## 2013-08-28 DIAGNOSIS — M5430 Sciatica, unspecified side: Secondary | ICD-10-CM

## 2013-08-28 DIAGNOSIS — Z923 Personal history of irradiation: Secondary | ICD-10-CM

## 2013-08-28 DIAGNOSIS — D0590 Unspecified type of carcinoma in situ of unspecified breast: Secondary | ICD-10-CM | POA: Diagnosis not present

## 2013-08-28 DIAGNOSIS — I10 Essential (primary) hypertension: Secondary | ICD-10-CM | POA: Diagnosis not present

## 2013-08-28 DIAGNOSIS — N63 Unspecified lump in unspecified breast: Secondary | ICD-10-CM

## 2013-08-28 LAB — BASIC METABOLIC PANEL WITH GFR
BUN: 24 mg/dL — ABNORMAL HIGH (ref 6–23)
CO2: 29 meq/L (ref 19–32)
Calcium: 9.6 mg/dL (ref 8.4–10.5)
Chloride: 103 mEq/L (ref 96–112)
Creat: 1.52 mg/dL — ABNORMAL HIGH (ref 0.50–1.10)
GFR, Est African American: 43 mL/min — ABNORMAL LOW
GFR, Est Non African American: 37 mL/min — ABNORMAL LOW
GLUCOSE: 119 mg/dL — AB (ref 70–99)
Potassium: 3.6 mEq/L (ref 3.5–5.3)
Sodium: 141 mEq/L (ref 135–145)

## 2013-08-28 NOTE — Assessment & Plan Note (Signed)
Patient still taking amlodipine 10 mg daily, clonidine 0.1 mg twice a day, losartan 25 mg daily. Initial BP was elevated due to stress and anxiety. Feel that a component of pain is elevating her BP at today's visit and we'll not make adjustments to her regimen. When she returns with her PCP regimen can be adjusted if blood pressure still elevated.

## 2013-08-28 NOTE — Progress Notes (Signed)
Case discussed with Dr. Doug Sou soon after the resident saw the patient.  We reviewed the resident's history and exam and pertinent patient test results.  I agree with the assessment, diagnosis and plan of care documented in the resident's note.

## 2013-08-28 NOTE — Assessment & Plan Note (Signed)
Much improved, patient using gabapentin for pain. Would recommend continued refusal of oxycodone prescriptions. Patient did not ask for a prescription of oxycodone at today's visit.

## 2013-08-28 NOTE — Progress Notes (Signed)
Subjective:     Patient ID: Monique Henderson, female   DOB: [birth year], 63 y.o.   MRN: [id number]  HPI The patient is a 63 year old female who is coming in today for an acute visit. Her PCP was unclear if she still needed Ambien and oxycodone. She has history of back fracture back in October following this she had decompressive laminectomy with fusion. She also has history of recent DCIS breast cancer however was unable to undergo recommended radiation therapy due to her back problems. She has missed several appointments with her cancer doctor. She also has past medical history of anemia, hypertension, GERD, back pain. She is also having a lump in her right breast right around the site of her surgery. She states that the area has been swollen since the surgery and she was told it would go down but it has never resolved. She is having pain in the area to palpation or movement associated with activity. She recently saw the radiation oncologist and did not tell them about it. She tried to go back to see the surgeon that did the operation about 1 year ago however was told she needed a referral from her doctor so she returns to our office. She is not having any nipple discharge and has not noticed any new lymph nodes.   Review of Systems  Constitutional: Negative for fever, chills, diaphoresis, activity change, appetite change, fatigue and unexpected weight change.  Respiratory: Negative for cough, chest tightness, shortness of breath and wheezing.   Cardiovascular: Positive for chest pain. Negative for palpitations and leg swelling.       Breast pain  Gastrointestinal: Negative for abdominal distention.       Objective:   Physical Exam  Constitutional: She is oriented to person, place, and time. She appears well-developed and well-nourished. She appears distressed.  Slightly anxious on exam  HENT:  Head: Normocephalic and atraumatic.  Neck: Normal range of motion. Neck supple. No JVD present. No  tracheal deviation present. No thyromegaly present.  Cardiovascular: Normal rate and regular rhythm.   Pulmonary/Chest: Effort normal and breath sounds normal. No respiratory distress. She has no wheezes. She has no rales. Chest wall is not dull to percussion. She exhibits mass, tenderness, deformity and swelling. She exhibits no bony tenderness, no laceration, no crepitus, no edema and no retraction. Right breast exhibits mass, skin change and tenderness. Right breast exhibits no inverted nipple and no nipple discharge. Left breast exhibits skin change. Left breast exhibits no inverted nipple, no mass, no nipple discharge and no tenderness.    No lymph nodes detected in axillary region on the right  Abdominal: Soft. Bowel sounds are normal.  Musculoskeletal: Normal range of motion.  Neurological: She is alert and oriented to person, place, and time. No cranial nerve deficit.       Assessment:   1. Please see problem oriented charting.  2. Disposition-patient will be seen back with her PCP in June as previously scheduled. Would recommend not refilling her oxycodone at this time and she did not ask for ambien so did not refill. She will be referred to Dr. Ethlyn Gallery office for exam of the lesion in her breast. Possibly hemorrhagic cyst which did not fully resolve post-operatively and could have been exacerbated by her radiation therapy last fall. She may not have followed up for it given her back issues last fall. If unable to be seen by surgery would get [place] of her right breast. No lymph nodes on exam and not  due for mammography until May this year (already scheduled).   Addendum: Unable to get in with surgery until April 13th so will order R breast ultrasound to evaluate lesion.

## 2013-08-28 NOTE — Assessment & Plan Note (Signed)
Last recorded creatinine value was much above her usual baseline. Will recheck basic metabolic panel at today's visit.

## 2013-08-28 NOTE — Assessment & Plan Note (Addendum)
Patient is status post partial radiation therapy and lumpectomy back in may of 2014. She does have residual lump in the right breast directly superior to the surgical scar. The lump is mobile/firm/very exquisitely tender. Will refer her back to Dr. Marlou Starks for examination, as well as refer her for ultrasound of the area to see if the region is fibrous versus mass versus cystic. She is at risk for recurrent breast cancer given her initial positive margins, lack of lymph node sampling. She did not complete radiation therapy completely and missed multiple sessions. She is due for mammogram bilateral breasts in may. She was ER/PR negative

## 2013-08-28 NOTE — Patient Instructions (Addendum)
We will check on your kidneys today and send you back to the surgeon Dr. Marlou Starks.   We would like him to check on that area on your chest. I would recommend using ice on it until you see him to see if that calms it down. Keep the ice on the area for 20 minutes about 2-3 times per day. If the ice hurts you can also try a heating pad on the area to make it feel better.   We will see you back in June, if you do not hear from the surgeon in the next week please call our office [phone].  Get your mammogram in May to make sure your breasts are healthy.  Breast tenderness often can be handled at home. You can try:  Getting fitted for a new bra that provides more support, especially during exercise.  Wearing a more supportive bra or sports bra while sleeping when your breasts are very tender.  If you have a breast injury, apply ice to the area:  Put ice in a plastic bag.  Place a towel between your skin and the bag.  Leave the ice on for 20 minutes, 2 3 times a day.  Applying a warm compress to the breasts for relief.  Taking over-the-counter pain relievers, if approved by your health care provider.

## 2013-08-28 NOTE — Assessment & Plan Note (Signed)
Patient had radiation of right breast with skin color changes and darkening of the entire breast. No signs of skin irritation or cellulitis.

## 2013-08-30 ENCOUNTER — Other Ambulatory Visit: Payer: Self-pay | Admitting: Internal Medicine

## 2013-09-04 ENCOUNTER — Other Ambulatory Visit: Payer: Medicare Other

## 2013-09-05 ENCOUNTER — Other Ambulatory Visit: Payer: Self-pay | Admitting: Neurological Surgery

## 2013-09-05 ENCOUNTER — Ambulatory Visit
Admission: RE | Admit: 2013-09-05 | Discharge: 2013-09-05 | Disposition: A | Discharge status: Home / Self Care | Payer: Medicare Other | Source: Ambulatory Visit | Attending: Neurological Surgery | Admitting: Neurological Surgery

## 2013-09-05 VITALS — BP 122/89 | HR 67

## 2013-09-05 DIAGNOSIS — M545 Low back pain, unspecified: Secondary | ICD-10-CM

## 2013-09-05 DIAGNOSIS — M5430 Sciatica, unspecified side: Secondary | ICD-10-CM

## 2013-09-05 DIAGNOSIS — M5126 Other intervertebral disc displacement, lumbar region: Secondary | ICD-10-CM | POA: Diagnosis not present

## 2013-09-05 MED ORDER — METHYLPREDNISOLONE ACETATE 40 MG/ML INJ SUSP (RADIOLOG
120.0000 mg | Freq: Once | INTRAMUSCULAR | Status: AC
  Administered 2013-09-05: 120 mg via EPIDURAL

## 2013-09-05 MED ORDER — IOHEXOL 180 MG/ML  SOLN
1.0000 mL | Freq: Once | INTRAMUSCULAR | Status: AC | PRN
  Administered 2013-09-05: 1 mL via EPIDURAL

## 2013-09-05 NOTE — Discharge Instructions (Signed)

## 2013-09-11 ENCOUNTER — Encounter (INDEPENDENT_AMBULATORY_CARE_PROVIDER_SITE_OTHER): Payer: Self-pay | Admitting: General Surgery

## 2013-09-11 ENCOUNTER — Encounter (INDEPENDENT_AMBULATORY_CARE_PROVIDER_SITE_OTHER): Payer: Self-pay

## 2013-09-11 ENCOUNTER — Ambulatory Visit (INDEPENDENT_AMBULATORY_CARE_PROVIDER_SITE_OTHER): Payer: Medicare Other | Admitting: General Surgery

## 2013-09-11 VITALS — BP 132/80 | HR 76 | Temp 97.5°F | Resp 16 | Ht 63.0 in | Wt 133.4 lb

## 2013-09-11 DIAGNOSIS — D0590 Unspecified type of carcinoma in situ of unspecified breast: Secondary | ICD-10-CM

## 2013-09-11 DIAGNOSIS — D0510 Intraductal carcinoma in situ of unspecified breast: Secondary | ICD-10-CM

## 2013-09-11 NOTE — Patient Instructions (Signed)
Continue regular self exams  

## 2013-09-11 NOTE — Progress Notes (Signed)
Subjective:     Patient ID: Monique Henderson, female   DOB: 1951-05-23, 63 y.o.   MRN: [id number]  HPI The patient is a 63 year old black female who is about one year status post right lumpectomy for DCIS. She was treated adjuvantly with radiation therapy which finished in September. Her main complaint is of some stinging pains in the medial right breast that come and go. She denies any discharge from her nipple.  Review of Systems  Constitutional: Negative.   HENT: Negative.   Eyes: Negative.   Respiratory: Negative.   Cardiovascular: Negative.   Gastrointestinal: Negative.   Endocrine: Negative.   Genitourinary: Negative.   Musculoskeletal: Negative.   Skin: Negative.   Allergic/Immunologic: Negative.   Neurological: Negative.   Hematological: Negative.   Psychiatric/Behavioral: Negative.        Objective:   Physical Exam  Constitutional: She is oriented to person, place, and time. She appears well-developed and well-nourished.  HENT:  Head: Normocephalic and atraumatic.  Eyes: Conjunctivae and EOM are normal. Pupils are equal, round, and reactive to light.  Neck: Normal range of motion. Neck supple.  Cardiovascular: Normal rate, regular rhythm and normal heart sounds.   Pulmonary/Chest: Effort normal and breath sounds normal.  She has a stable palpable seroma beneath her incision in the medial right breast. Other than this there is no other palpable mass in either breast. There is no palpable axillary, supraclavicular, or cervical lymphadenopathy.  Abdominal: Soft. Bowel sounds are normal.  Musculoskeletal: Normal range of motion.  Lymphadenopathy:    She has no cervical adenopathy.  Neurological: She is alert and oriented to person, place, and time.  Skin: Skin is warm and dry.  Psychiatric: She has a normal mood and affect. Her behavior is normal.       Assessment:     The patient is a 63 year old black female who is one year status post right lumpectomy for DCIS     Plan:     She has a persistent seroma. I have offered to have this area aspirated under ultrasound guidance but she has declined. We will continue to keep anonymous area. She is due for her yearly mammogram next month. I will plan to see her back in about 6 months.

## 2013-09-13 ENCOUNTER — Other Ambulatory Visit: Payer: Medicare Other

## 2013-09-20 ENCOUNTER — Ambulatory Visit: Payer: Medicare Other | Admitting: Internal Medicine

## 2013-10-02 DIAGNOSIS — I10 Essential (primary) hypertension: Secondary | ICD-10-CM | POA: Diagnosis not present

## 2013-10-02 DIAGNOSIS — M5126 Other intervertebral disc displacement, lumbar region: Secondary | ICD-10-CM | POA: Diagnosis not present

## 2013-10-05 DIAGNOSIS — H40059 Ocular hypertension, unspecified eye: Secondary | ICD-10-CM | POA: Diagnosis not present

## 2013-10-05 DIAGNOSIS — H10439 Chronic follicular conjunctivitis, unspecified eye: Secondary | ICD-10-CM | POA: Diagnosis not present

## 2013-10-05 DIAGNOSIS — H5200 Hypermetropia, unspecified eye: Secondary | ICD-10-CM | POA: Diagnosis not present

## 2013-10-05 DIAGNOSIS — H2589 Other age-related cataract: Secondary | ICD-10-CM | POA: Diagnosis not present

## 2013-10-10 ENCOUNTER — Other Ambulatory Visit: Payer: Self-pay | Admitting: Internal Medicine

## 2013-10-10 NOTE — Telephone Encounter (Signed)
Refill approved - nurse to call in. 

## 2013-10-11 ENCOUNTER — Ambulatory Visit
Admission: RE | Admit: 2013-10-11 | Discharge: 2013-10-11 | Disposition: A | Discharge status: Home / Self Care | Payer: Medicare Other | Source: Ambulatory Visit | Attending: Internal Medicine | Admitting: Internal Medicine

## 2013-10-11 ENCOUNTER — Encounter (INDEPENDENT_AMBULATORY_CARE_PROVIDER_SITE_OTHER): Payer: Self-pay

## 2013-10-11 ENCOUNTER — Other Ambulatory Visit: Payer: Self-pay | Admitting: Internal Medicine

## 2013-10-11 DIAGNOSIS — N63 Unspecified lump in unspecified breast: Secondary | ICD-10-CM

## 2013-10-11 NOTE — Telephone Encounter (Signed)
Called to pharm 

## 2013-10-31 ENCOUNTER — Other Ambulatory Visit: Payer: Self-pay | Admitting: *Deleted

## 2013-10-31 MED ORDER — TRAMADOL HCL 50 MG PO TABS: 50.0000 mg | ORAL_TABLET | Freq: Four times a day (QID) | ORAL | Status: DC | PRN

## 2013-10-31 NOTE — Telephone Encounter (Signed)
Patient needs to be evaluated to see if refill of oxycodone is indicated.

## 2013-10-31 NOTE — Telephone Encounter (Signed)
Tramadol rx called to [facility]. 

## 2013-10-31 NOTE — Telephone Encounter (Signed)
Pt asked for Tramadol not Oxycodone. She an appt on the 10th.

## 2013-10-31 NOTE — Telephone Encounter (Signed)
Refill approved - nurse to call in. 

## 2013-10-31 NOTE — Telephone Encounter (Signed)
Also left message about leg cramps - tried calling pt, no answer. Pt has appt on 6/10; left message to keep this appt.

## 2013-11-08 ENCOUNTER — Encounter: Payer: Medicare Other | Admitting: Internal Medicine

## 2013-11-08 ENCOUNTER — Encounter: Payer: Self-pay | Admitting: Internal Medicine

## 2013-11-13 ENCOUNTER — Other Ambulatory Visit: Payer: Self-pay | Admitting: Neurological Surgery

## 2013-11-13 DIAGNOSIS — M5126 Other intervertebral disc displacement, lumbar region: Secondary | ICD-10-CM | POA: Diagnosis not present

## 2013-11-13 DIAGNOSIS — I10 Essential (primary) hypertension: Secondary | ICD-10-CM | POA: Diagnosis not present

## 2013-12-04 ENCOUNTER — Encounter (HOSPITAL_COMMUNITY): Payer: Self-pay | Admitting: Pharmacy Technician

## 2013-12-04 NOTE — Pre-Procedure Instructions (Signed)
Monique Henderson  12/04/2013   Your procedure is scheduled on:  Wednesday December 13, 2013 at 8:30 AM.  Report to [facility] Admitting at 6:30 AM.  Call this number if you have problems the morning of surgery: [phone]   Remember:   Do not eat food or drink liquids after midnight.   Take these medicines the morning of surgery with A SIP OF WATER: Amlodipine (Norvasc), Cetirizine (Zyrtec), Clonidine (Catapres), Oxycodone (Percocet) if needed for pain, Tramadol (Ultram) if needed for pain   Do not wear jewelry, make-up or nail polish.  Do not wear lotions, powders, or perfumes.   Do not shave 48 hours prior to surgery. .  Do not bring valuables to the hospital.  [facility] is not responsible for any belongings or valuables.               Contacts, dentures or bridgework may not be worn into surgery.  Leave suitcase in the car. After surgery it may be brought to your room.  For patients admitted to the hospital, discharge time is determined by your treatment team.               Patients discharged the day of surgery will not be allowed to drive home.  Name and phone number of your driver: Family/Friend  Special Instructions: [place] - Preparing for Surgery  Before surgery, you can play an important role.  Because skin is not sterile, your skin needs to be as free of germs as possible.  You can reduce the number of germs on you skin by washing with CHG (chlorahexidine gluconate) soap before surgery.  CHG is an antiseptic cleaner which kills germs and bonds with the skin to continue killing germs even after washing.  Please DO NOT use if you have an allergy to CHG or antibacterial soaps.  If your skin becomes reddened/irritated stop using the CHG and inform your nurse when you arrive at Short Stay.  Do not shave (including legs and underarms) for at least 48 hours prior to the first CHG shower.  You may shave your face.  Please follow these instructions carefully:   1.   Shower with CHG Soap the night before surgery and the                                morning of Surgery.  2.  If you choose to wash your hair, wash your hair first as usual with your       normal shampoo.  3.  After you shampoo, rinse your hair and body thoroughly to remove the                      Shampoo.  4.  Use CHG as you would any other liquid soap.  You can apply chg directly       to the skin and wash gently with scrungie or a clean washcloth.  5.  Apply the CHG Soap to your body ONLY FROM THE NECK DOWN.        Do not use on open wounds or open sores.  Avoid contact with your eyes,       ears, mouth and genitals (private parts).  Wash genitals (private parts)       with your normal soap.  6.  Wash thoroughly, paying special attention to the area where your surgery  will be performed.  7.  Thoroughly rinse your body with warm water from the neck down.  8.  DO NOT shower/wash with your normal soap after using and rinsing off       the CHG Soap.  9.  Pat yourself dry with a clean towel.            10.  Wear clean pajamas.            11.  Place clean sheets on your bed the night of your first shower and do not        sleep with pets.  Day of Surgery  Do not apply any lotions/deoderants the morning of surgery.  Please wear clean clothes to the hospital/surgery center.    Shower using CHG soap the night before and the morning of your surgery   Please read over the following fact sheets that you were given: Pain Booklet, Coughing and Deep Breathing, Blood Transfusion Information, MRSA Information and Surgical Site Infection Prevention

## 2013-12-05 ENCOUNTER — Encounter (HOSPITAL_COMMUNITY)
Admission: RE | Admit: 2013-12-05 | Discharge: 2013-12-05 | Disposition: A | Discharge status: Home / Self Care | Payer: Medicare Other | Source: Ambulatory Visit | Attending: Neurological Surgery | Admitting: Neurological Surgery

## 2013-12-05 ENCOUNTER — Encounter (HOSPITAL_COMMUNITY): Payer: Self-pay

## 2013-12-05 DIAGNOSIS — Z01812 Encounter for preprocedural laboratory examination: Secondary | ICD-10-CM | POA: Diagnosis not present

## 2013-12-05 DIAGNOSIS — Z01818 Encounter for other preprocedural examination: Secondary | ICD-10-CM | POA: Diagnosis not present

## 2013-12-05 DIAGNOSIS — Z01810 Encounter for preprocedural cardiovascular examination: Secondary | ICD-10-CM | POA: Insufficient documentation

## 2013-12-05 LAB — BASIC METABOLIC PANEL
ANION GAP: 13 (ref 5–15)
BUN: 20 mg/dL (ref 6–23)
CO2: 30 mEq/L (ref 19–32)
CREATININE: 1.38 mg/dL — AB (ref 0.50–1.10)
Calcium: 9.3 mg/dL (ref 8.4–10.5)
Chloride: 104 mEq/L (ref 96–112)
GFR, EST AFRICAN AMERICAN: 47 mL/min — AB (ref >90)
GFR, EST NON AFRICAN AMERICAN: 40 mL/min — AB (ref >90)
Glucose, Bld: 95 mg/dL (ref 70–99)
Potassium: 4.7 mEq/L (ref 3.7–5.3)
Sodium: 147 mEq/L (ref 137–147)

## 2013-12-05 LAB — TYPE AND SCREEN
ABO/RH(D): O POS
Antibody Screen: NEGATIVE

## 2013-12-05 LAB — CBC WITH DIFFERENTIAL/PLATELET
BASOS ABS: 0 10*3/uL (ref 0.0–0.1)
Basophils Relative: 1 % (ref 0–1)
Eosinophils Absolute: 0.3 10*3/uL (ref 0.0–0.7)
Eosinophils Relative: 6 % — ABNORMAL HIGH (ref 0–5)
HCT: 40.6 % (ref 36.0–46.0)
HEMOGLOBIN: 12.9 g/dL (ref 12.0–15.0)
Lymphocytes Relative: 25 % (ref 12–46)
Lymphs Abs: 1.3 10*3/uL (ref 0.7–4.0)
MCH: 30.4 pg (ref 26.0–34.0)
MCHC: 31.8 g/dL (ref 30.0–36.0)
MCV: 95.5 fL (ref 78.0–100.0)
Monocytes Absolute: 0.5 10*3/uL (ref 0.1–1.0)
Monocytes Relative: 9 % (ref 3–12)
NEUTROS ABS: 3.1 10*3/uL (ref 1.7–7.7)
NEUTROS PCT: 59 % (ref 43–77)
PLATELETS: 402 10*3/uL — AB (ref 150–400)
RBC: 4.25 MIL/uL (ref 3.87–5.11)
RDW: 14.3 % (ref 11.5–15.5)
WBC: 5.2 10*3/uL (ref 4.0–10.5)

## 2013-12-05 LAB — SURGICAL PCR SCREEN
MRSA, PCR: NEGATIVE
STAPHYLOCOCCUS AUREUS: POSITIVE — AB

## 2013-12-05 LAB — HEPATIC FUNCTION PANEL
ALT: 33 U/L (ref 0–35)
AST: 35 U/L (ref 0–37)
Albumin: 3.6 g/dL (ref 3.5–5.2)
Alkaline Phosphatase: 92 U/L (ref 39–117)
BILIRUBIN TOTAL: 0.2 mg/dL — AB (ref 0.3–1.2)
Bilirubin, Direct: <0.2 mg/dL (ref 0.0–0.3)
Total Protein: 7.3 g/dL (ref 6.0–8.3)

## 2013-12-05 LAB — PROTIME-INR
INR: 0.87 (ref 0.00–1.49)
PROTHROMBIN TIME: 11.8 s (ref 11.6–15.2)

## 2013-12-05 NOTE — Progress Notes (Signed)
Patient made aware that nasal swab was positive for staph and that a script was called to her pharmacy at [phone].Patient verbalized instructions

## 2013-12-06 ENCOUNTER — Encounter (HOSPITAL_COMMUNITY): Payer: Self-pay | Admitting: Certified Registered"

## 2013-12-06 ENCOUNTER — Encounter (HOSPITAL_COMMUNITY): Payer: Self-pay | Admitting: Vascular Surgery

## 2013-12-06 ENCOUNTER — Encounter (HOSPITAL_COMMUNITY): Payer: Self-pay

## 2013-12-06 ENCOUNTER — Encounter: Payer: Self-pay | Admitting: Internal Medicine

## 2013-12-06 ENCOUNTER — Ambulatory Visit (INDEPENDENT_AMBULATORY_CARE_PROVIDER_SITE_OTHER): Payer: Medicare Other | Admitting: Internal Medicine

## 2013-12-06 VITALS — BP 176/115 | HR 85 | Temp 97.7°F | Ht 63.0 in | Wt 138.5 lb

## 2013-12-06 DIAGNOSIS — I10 Essential (primary) hypertension: Secondary | ICD-10-CM | POA: Diagnosis not present

## 2013-12-06 DIAGNOSIS — N259 Disorder resulting from impaired renal tubular function, unspecified: Secondary | ICD-10-CM

## 2013-12-06 DIAGNOSIS — N289 Disorder of kidney and ureter, unspecified: Secondary | ICD-10-CM

## 2013-12-06 DIAGNOSIS — M5442 Lumbago with sciatica, left side: Secondary | ICD-10-CM

## 2013-12-06 MED ORDER — CLONIDINE HCL 0.1 MG PO TABS: 0.1000 mg | ORAL_TABLET | Freq: Every day | ORAL | Status: DC

## 2013-12-06 MED ORDER — OXYCODONE HCL 5 MG PO TABS: 5.0000 mg | ORAL_TABLET | Freq: Four times a day (QID) | ORAL | Status: DC | PRN

## 2013-12-06 MED ORDER — AMLODIPINE BESYLATE 10 MG PO TABS: 10.0000 mg | ORAL_TABLET | Freq: Every day | ORAL | Status: DC

## 2013-12-06 MED ORDER — CLONIDINE HCL 0.1 MG PO TABS: 0.1000 mg | ORAL_TABLET | Freq: Two times a day (BID) | ORAL | Status: DC

## 2013-12-06 MED ORDER — LOSARTAN POTASSIUM 25 MG PO TABS: 25.0000 mg | ORAL_TABLET | Freq: Every day | ORAL | Status: DC

## 2013-12-06 NOTE — Assessment & Plan Note (Signed)
BP Readings from Last 3 Encounters:  12/06/13 176/115  12/05/13 145/96  09/11/13 132/80    Lab Results  Component Value Date   NA 147 12/05/2013   K 4.7 12/05/2013   CREATININE 1.38* 12/05/2013    Assessment: Blood pressure control: severely elevated Progress toward BP goal:  deteriorated Comments: Blood pressure is severely elevated today due to nonadherence to medications; patient is out of amlodipine and apparently did not have losartan refilled for the past few months.  She is completely asymptomatic.  Plan: Medications:  I discussed at length the importance of taking her medications and controlling her blood pressure, and I emphasized to her that this was necessary in order for her to undergo elective surgery which is scheduled for next week.  She agreed to pick up her medications today and start taking them, and to follow up here in 5 days on 12/11/2013.

## 2013-12-06 NOTE — Progress Notes (Signed)
   Subjective:    Patient ID: Monique Henderson, female    DOB: 10-Feb-1951, 63 y.o.   MRN: [id number]  HPI Patient returns for management of her hypertension, lumbar spinal stenosis with chronic back pain, anemia, and other problems.  She is scheduled for back surgery next week by her neurosurgeon Dr. Sherley Bounds.  Today her primary complaint is back pain which radiates down her left thigh posteriorly.  She is currently out on her amlodipine, and she apparently did not refill losartan several months ago; the only blood pressure medicine she has taken recently is clonidine 0.1 mg twice a day.   Review of Systems  Constitutional: Negative for fever, chills and diaphoresis.  Respiratory: Positive for cough (Dry, attributes to allergies). Negative for shortness of breath.   Cardiovascular: Negative for chest pain and leg swelling.  Gastrointestinal: Negative for nausea, vomiting, abdominal pain and blood in stool.  Genitourinary: Negative for dysuria and frequency.  Musculoskeletal: Positive for back pain.  Neurological: Negative for dizziness, syncope, weakness, light-headedness and numbness.       Objective:   Physical Exam  Eyes:  Fundoscopic exam:      The right eye shows no papilledema.       The left eye shows no papilledema.  Cardiovascular: Normal rate and regular rhythm.  Exam reveals no gallop and no friction rub.   No murmur heard. Pulmonary/Chest: Effort normal and breath sounds normal. No respiratory distress. She has no wheezes. She has no rales.  Abdominal: Soft. Bowel sounds are normal. She exhibits no distension. There is no tenderness. There is no rebound and no guarding.  Musculoskeletal: She exhibits no edema.       Assessment & Plan:

## 2013-12-06 NOTE — Progress Notes (Signed)
Anesthesia Chart Review:  Patient is a 63 year old female scheduled for L2-3 extreme lumber interbody fusion on 12/13/13 by Dr. Ronnald Ramp.  History includes smoking, GERD, hepatitis C, HTN, uterine fibroids, normocytic anemia, SOB with pain, osteoarthritis, breast cancer s/p right lumpectomy and radiation '14, left L2-3 hemilaminectomy/microdiskectomy 03/23/13. PCP is thru [facility] with visit earlier today  She is scheduled for a follow-up visit on 12/11/13 to recheck her BP.  It was elevated at 176/115, so her Cozaar and amlodipine were reordered today.  She is also on clonidine.  Her BP at PAT was 14596. Dr. Marinda Elk who saw her today is aware of plans for surgery.   EKG on 12/05/13 showed: NSR, late r wave transition, non-specific ST abnormality.  HR is faster when compared to prior tracing on 10/12/12.  CXR on 12/05/13 showed: Stable cardiomegaly.  No active lung disease.  Preoperative labs noted. AST/ALT WNL.  PLT count 402K.  She has follow-up with her PCP next week to re-evaluate her BP. If results are reasonable and otherwise no acute changes then I would anticipate that she can proceed as planned.    George [facility] Short Stay Center/Anesthesiology Phone [phone] 12/06/2013 4:37 PM

## 2013-12-06 NOTE — Patient Instructions (Signed)
Please pick up your blood pressure medications today and start taking them regularly as prescribed. Return on Monday 12/11/2013 for followup of your high blood pressure.

## 2013-12-06 NOTE — Assessment & Plan Note (Signed)
Lab Results  Component Value Date   CREATININE 1.38* 12/05/2013   CREATININE 1.52* 08/28/2013   CREATININE 1.53* 03/21/2013   CREATININE 1.07 03/01/2013   CREATININE 1.12* 03/01/2013   CREATININE 1.32* 02/27/2013     Assessment:  Patient's creatinine was stable when checked yesterday.  Plan:  Follow creatinine; she ill need a repeat metabolic panel once she has resumed the losartan.

## 2013-12-11 ENCOUNTER — Ambulatory Visit: Payer: Medicare Other | Admitting: Internal Medicine

## 2013-12-12 MED ORDER — CEFAZOLIN SODIUM-DEXTROSE 2-3 GM-% IV SOLR
2.0000 g | INTRAVENOUS | Status: DC
  Filled 2013-12-12: qty 50

## 2013-12-12 MED ORDER — DEXAMETHASONE SODIUM PHOSPHATE 10 MG/ML IJ SOLN: 10.0000 mg | INTRAMUSCULAR | Status: DC

## 2013-12-13 ENCOUNTER — Encounter (HOSPITAL_COMMUNITY): Admission: RE | Payer: Self-pay | Source: Ambulatory Visit

## 2013-12-13 ENCOUNTER — Inpatient Hospital Stay (HOSPITAL_COMMUNITY): Admission: RE | Admit: 2013-12-13 | Payer: Medicare Other | Source: Ambulatory Visit | Admitting: Neurological Surgery

## 2013-12-13 SURGERY — ANTERIOR LATERAL LUMBAR FUSION 1 LEVEL
Anesthesia: General | Laterality: Left

## 2013-12-13 MED ORDER — FENTANYL CITRATE 0.05 MG/ML IJ SOLN
INTRAMUSCULAR | Status: AC
  Filled 2013-12-13: qty 5

## 2013-12-13 MED ORDER — PHENYLEPHRINE 40 MCG/ML (10ML) SYRINGE FOR IV PUSH (FOR BLOOD PRESSURE SUPPORT)
PREFILLED_SYRINGE | INTRAVENOUS | Status: AC
  Filled 2013-12-13: qty 10

## 2013-12-13 MED ORDER — EPHEDRINE SULFATE 50 MG/ML IJ SOLN
INTRAMUSCULAR | Status: AC
  Filled 2013-12-13: qty 1

## 2013-12-13 MED ORDER — MIDAZOLAM HCL 2 MG/2ML IJ SOLN
INTRAMUSCULAR | Status: AC
  Filled 2013-12-13: qty 2

## 2013-12-13 MED ORDER — SODIUM CHLORIDE 0.9 % IJ SOLN
INTRAMUSCULAR | Status: AC
  Filled 2013-12-13: qty 10

## 2013-12-13 MED ORDER — PROPOFOL 10 MG/ML IV BOLUS
INTRAVENOUS | Status: AC
  Filled 2013-12-13: qty 20

## 2013-12-13 MED ORDER — LIDOCAINE HCL (CARDIAC) 20 MG/ML IV SOLN
INTRAVENOUS | Status: AC
  Filled 2013-12-13: qty 5

## 2013-12-13 MED ORDER — ONDANSETRON HCL 4 MG/2ML IJ SOLN
INTRAMUSCULAR | Status: AC
  Filled 2013-12-13: qty 2

## 2013-12-13 MED ORDER — SUCCINYLCHOLINE CHLORIDE 20 MG/ML IJ SOLN
INTRAMUSCULAR | Status: AC
  Filled 2013-12-13: qty 1

## 2013-12-13 MED ORDER — ROCURONIUM BROMIDE 50 MG/5ML IV SOLN
INTRAVENOUS | Status: AC
Start: 2013-12-13 — End: 2013-12-13
  Filled 2013-12-13: qty 1

## 2013-12-15 ENCOUNTER — Other Ambulatory Visit: Payer: Self-pay | Admitting: Neurological Surgery

## 2013-12-15 DIAGNOSIS — I10 Essential (primary) hypertension: Secondary | ICD-10-CM | POA: Diagnosis not present

## 2013-12-15 DIAGNOSIS — M5126 Other intervertebral disc displacement, lumbar region: Secondary | ICD-10-CM | POA: Diagnosis not present

## 2013-12-27 ENCOUNTER — Other Ambulatory Visit: Payer: Self-pay | Admitting: *Deleted

## 2013-12-27 MED ORDER — TRAMADOL HCL 50 MG PO TABS: 50.0000 mg | ORAL_TABLET | Freq: Four times a day (QID) | ORAL | Status: DC | PRN

## 2013-12-27 NOTE — Telephone Encounter (Signed)
   Refill approved - nurse to call in.    However, patient needs a follow up appointment within a week to assess her hypertension, which was uncontrolled when I saw her on 7/08.  She subsequently missed her appointment here on 7/13.

## 2013-12-27 NOTE — Telephone Encounter (Signed)
appt scheduled for mon 8/3 at 1015 dr gill

## 2013-12-27 NOTE — Telephone Encounter (Signed)
Pt called, no answer.  Left message for pt to call clinic for appointment.

## 2013-12-27 NOTE — Telephone Encounter (Signed)
Pt scheduled for appointment Monday 8/ 3

## 2014-01-01 ENCOUNTER — Ambulatory Visit: Payer: Medicare Other | Admitting: Internal Medicine

## 2014-01-01 NOTE — Telephone Encounter (Signed)
Pt was a no show for her appt on 8/3.  I spoke with the pharmacy regarding her BP meds and I was told she picked up amlodipine on 7/16 for a 90 day supply.  Picked up losartan on 7/8 for 90 day supply.  She last picked up clonidine on 6/24 for a 30 day supply.

## 2014-01-02 NOTE — Telephone Encounter (Signed)
Called to pharm 

## 2014-01-05 ENCOUNTER — Encounter (HOSPITAL_COMMUNITY): Payer: Self-pay | Admitting: Pharmacy Technician

## 2014-01-08 ENCOUNTER — Encounter (HOSPITAL_COMMUNITY): Payer: Self-pay

## 2014-01-08 ENCOUNTER — Encounter (HOSPITAL_COMMUNITY)
Admission: RE | Admit: 2014-01-08 | Discharge: 2014-01-08 | Disposition: A | Discharge status: Home / Self Care | Payer: Medicare Other | Source: Ambulatory Visit | Attending: Neurological Surgery | Admitting: Neurological Surgery

## 2014-01-08 DIAGNOSIS — Z01818 Encounter for other preprocedural examination: Secondary | ICD-10-CM | POA: Insufficient documentation

## 2014-01-08 DIAGNOSIS — Z01812 Encounter for preprocedural laboratory examination: Secondary | ICD-10-CM | POA: Diagnosis not present

## 2014-01-08 LAB — PROTIME-INR
INR: 0.87 (ref 0.00–1.49)
Prothrombin Time: 11.8 seconds (ref 11.6–15.2)

## 2014-01-08 LAB — CBC WITH DIFFERENTIAL/PLATELET
BASOS ABS: 0 10*3/uL (ref 0.0–0.1)
Basophils Relative: 1 % (ref 0–1)
EOS ABS: 0.4 10*3/uL (ref 0.0–0.7)
Eosinophils Relative: 9 % — ABNORMAL HIGH (ref 0–5)
HCT: 40.4 % (ref 36.0–46.0)
HEMOGLOBIN: 13 g/dL (ref 12.0–15.0)
Lymphocytes Relative: 33 % (ref 12–46)
Lymphs Abs: 1.7 10*3/uL (ref 0.7–4.0)
MCH: 30.2 pg (ref 26.0–34.0)
MCHC: 32.2 g/dL (ref 30.0–36.0)
MCV: 94 fL (ref 78.0–100.0)
MONO ABS: 0.5 10*3/uL (ref 0.1–1.0)
MONOS PCT: 10 % (ref 3–12)
Neutro Abs: 2.4 10*3/uL (ref 1.7–7.7)
Neutrophils Relative %: 47 % (ref 43–77)
Platelets: 390 10*3/uL (ref 150–400)
RBC: 4.3 MIL/uL (ref 3.87–5.11)
RDW: 13.8 % (ref 11.5–15.5)
WBC: 5 10*3/uL (ref 4.0–10.5)

## 2014-01-08 LAB — COMPREHENSIVE METABOLIC PANEL
ALT: 66 U/L — ABNORMAL HIGH (ref 0–35)
AST: 69 U/L — ABNORMAL HIGH (ref 0–37)
Albumin: 3.5 g/dL (ref 3.5–5.2)
Alkaline Phosphatase: 111 U/L (ref 39–117)
Anion gap: 14 (ref 5–15)
BUN: 21 mg/dL (ref 6–23)
CO2: 25 meq/L (ref 19–32)
Calcium: 9.4 mg/dL (ref 8.4–10.5)
Chloride: 104 mEq/L (ref 96–112)
Creatinine, Ser: 1.4 mg/dL — ABNORMAL HIGH (ref 0.50–1.10)
GFR calc Af Amer: 46 mL/min — ABNORMAL LOW (ref >90)
GFR, EST NON AFRICAN AMERICAN: 40 mL/min — AB (ref >90)
Glucose, Bld: 92 mg/dL (ref 70–99)
Potassium: 4.3 mEq/L (ref 3.7–5.3)
SODIUM: 143 meq/L (ref 137–147)
Total Bilirubin: <0.2 mg/dL — ABNORMAL LOW (ref 0.3–1.2)
Total Protein: 7.2 g/dL (ref 6.0–8.3)

## 2014-01-08 LAB — TYPE AND SCREEN
ABO/RH(D): O POS
Antibody Screen: NEGATIVE

## 2014-01-08 LAB — SURGICAL PCR SCREEN
MRSA, PCR: NEGATIVE
Staphylococcus aureus: NEGATIVE

## 2014-01-08 NOTE — Pre-Procedure Instructions (Addendum)
Monique Henderson  01/08/2014   Your procedure is scheduled on:  01/18/14  Report to [facility] short stay admitting at 530 AM.  Call this number if you have problems the morning of surgery: [phone]   Remember:   Do not eat food or drink liquids after midnight.   Take these medicines the morning of surgery with A SIP OF WATER: amlodipine, clonidine, pain med if needed    Take all meds until day of surgery except as instructed below or per dr   Bridgette Habermann all herbel meds, nsaids (aleve,naproxen,advil,ibuprofen) 5 days prior to surgery(01/13/14) including vitamins, aspirin   Do not wear jewelry, make-up or nail polish.  Do not wear lotions, powders, or perfumes. You may wear deodorant.  Do not shave 48 hours prior to surgery. Men may shave face and neck.  Do not bring valuables to the hospital.  [facility] is not responsible                  for any belongings or valuables.               Contacts, dentures or bridgework may not be worn into surgery.  Leave suitcase in the car. After surgery it may be brought to your room.  For patients admitted to the hospital, discharge time is determined by your                treatment team.               Patients discharged the day of surgery will not be allowed to drive  home.  Name and phone number of your driver:   Special Instructions:  Special Instructions: [place] - Preparing for Surgery  Before surgery, you can play an important role.  Because skin is not sterile, your skin needs to be as free of germs as possible.  You can reduce the number of germs on you skin by washing with CHG (chlorahexidine gluconate) soap before surgery.  CHG is an antiseptic cleaner which kills germs and bonds with the skin to continue killing germs even after washing.  Please DO NOT use if you have an allergy to CHG or antibacterial soaps.  If your skin becomes reddened/irritated stop using the CHG and inform your nurse when you arrive at Short Stay.  Do not  shave (including legs and underarms) for at least 48 hours prior to the first CHG shower.  You may shave your face.  Please follow these instructions carefully:   1.  Shower with CHG Soap the night before surgery and the morning of Surgery.  2.  If you choose to wash your hair, wash your hair first as usual with your normal shampoo.  3.  After you shampoo, rinse your hair and body thoroughly to remove the Shampoo.  4.  Use CHG as you would any other liquid soap.  You can apply chg directly  to the skin and wash gently with scrungie or a clean washcloth.  5.  Apply the CHG Soap to your body ONLY FROM THE NECK DOWN.  Do not use on open wounds or open sores.  Avoid contact with your eyes ears, mouth and genitals (private parts).  Wash genitals (private parts)       with your normal soap.  6.  Wash thoroughly, paying special attention to the area where your surgery will be performed.  7.  Thoroughly rinse your body with warm water from the neck down.  8.  DO NOT shower/wash with your normal soap after using and rinsing off the CHG Soap.  9.  Pat yourself dry with a clean towel.            10.  Wear clean pajamas.            11.  Place clean sheets on your bed the night of your first shower and do not sleep with pets.  Day of Surgery  Do not apply any lotions/deodorants the morning of surgery.  Please wear clean clothes to the hospital/surgery center.   Please read over the following fact sheets that you were given: Pain Booklet, Coughing and Deep Breathing, Blood Transfusion Information, MRSA Information and Surgical Site Infection Prevention

## 2014-01-08 NOTE — Progress Notes (Signed)
Patient stated "got sores in nose after using" (mupriocin in July).

## 2014-01-17 MED ORDER — DEXAMETHASONE SODIUM PHOSPHATE 10 MG/ML IJ SOLN
10.0000 mg | INTRAMUSCULAR | Status: AC
  Administered 2014-01-18: 10 mg via INTRAVENOUS
  Filled 2014-01-17: qty 1

## 2014-01-17 MED ORDER — CEFAZOLIN SODIUM-DEXTROSE 2-3 GM-% IV SOLR
2.0000 g | INTRAVENOUS | Status: AC
  Administered 2014-01-18: 2 g via INTRAVENOUS
  Filled 2014-01-17: qty 50

## 2014-01-18 ENCOUNTER — Encounter (HOSPITAL_COMMUNITY): Payer: Medicare Other | Admitting: Anesthesiology

## 2014-01-18 ENCOUNTER — Encounter (HOSPITAL_COMMUNITY)
Admission: RE | Disposition: A | Discharge status: Home / Self Care | Payer: Self-pay | Source: Ambulatory Visit | Attending: Neurological Surgery

## 2014-01-18 ENCOUNTER — Inpatient Hospital Stay (HOSPITAL_COMMUNITY): Payer: Medicare Other | Admitting: Anesthesiology

## 2014-01-18 ENCOUNTER — Inpatient Hospital Stay (HOSPITAL_COMMUNITY)
Admission: RE | Admit: 2014-01-18 | Discharge: 2014-01-19 | DRG: 460 | Disposition: A | Discharge status: Home / Self Care | Payer: Medicare Other | Source: Ambulatory Visit | Attending: Neurological Surgery | Admitting: Neurological Surgery

## 2014-01-18 ENCOUNTER — Encounter (HOSPITAL_COMMUNITY): Payer: Self-pay | Admitting: *Deleted

## 2014-01-18 ENCOUNTER — Inpatient Hospital Stay (HOSPITAL_COMMUNITY): Payer: Medicare Other

## 2014-01-18 DIAGNOSIS — Z7982 Long term (current) use of aspirin: Secondary | ICD-10-CM | POA: Diagnosis not present

## 2014-01-18 DIAGNOSIS — Z923 Personal history of irradiation: Secondary | ICD-10-CM | POA: Diagnosis not present

## 2014-01-18 DIAGNOSIS — Z853 Personal history of malignant neoplasm of breast: Secondary | ICD-10-CM

## 2014-01-18 DIAGNOSIS — Z981 Arthrodesis status: Secondary | ICD-10-CM

## 2014-01-18 DIAGNOSIS — B1920 Unspecified viral hepatitis C without hepatic coma: Secondary | ICD-10-CM | POA: Diagnosis not present

## 2014-01-18 DIAGNOSIS — M5126 Other intervertebral disc displacement, lumbar region: Secondary | ICD-10-CM | POA: Diagnosis not present

## 2014-01-18 DIAGNOSIS — K219 Gastro-esophageal reflux disease without esophagitis: Secondary | ICD-10-CM | POA: Diagnosis present

## 2014-01-18 DIAGNOSIS — F17200 Nicotine dependence, unspecified, uncomplicated: Secondary | ICD-10-CM | POA: Diagnosis present

## 2014-01-18 DIAGNOSIS — M1990 Unspecified osteoarthritis, unspecified site: Secondary | ICD-10-CM | POA: Diagnosis present

## 2014-01-18 DIAGNOSIS — D649 Anemia, unspecified: Secondary | ICD-10-CM | POA: Diagnosis present

## 2014-01-18 DIAGNOSIS — R0602 Shortness of breath: Secondary | ICD-10-CM | POA: Diagnosis not present

## 2014-01-18 DIAGNOSIS — I10 Essential (primary) hypertension: Secondary | ICD-10-CM | POA: Diagnosis present

## 2014-01-18 DIAGNOSIS — M519 Unspecified thoracic, thoracolumbar and lumbosacral intervertebral disc disorder: Secondary | ICD-10-CM | POA: Diagnosis not present

## 2014-01-18 DIAGNOSIS — M549 Dorsalgia, unspecified: Secondary | ICD-10-CM | POA: Diagnosis not present

## 2014-01-18 HISTORY — PX: ANTERIOR LAT LUMBAR FUSION: SHX1168

## 2014-01-18 HISTORY — PX: ANTERIOR LUMBAR FUSION: SHX1170

## 2014-01-18 HISTORY — DX: Arthrodesis status: Z98.1

## 2014-01-18 SURGERY — ANTERIOR LATERAL LUMBAR FUSION 1 LEVEL
Anesthesia: General

## 2014-01-18 MED ORDER — PROPOFOL 10 MG/ML IV BOLUS
INTRAVENOUS | Status: AC
  Filled 2014-01-18: qty 20

## 2014-01-18 MED ORDER — ONDANSETRON HCL 4 MG/2ML IJ SOLN: 4.0000 mg | INTRAMUSCULAR | Status: DC | PRN

## 2014-01-18 MED ORDER — DEXAMETHASONE 4 MG PO TABS
4.0000 mg | ORAL_TABLET | Freq: Four times a day (QID) | ORAL | Status: DC
  Administered 2014-01-18 – 2014-01-19 (×2): 4 mg via ORAL
  Filled 2014-01-18 (×2): qty 1

## 2014-01-18 MED ORDER — ONDANSETRON HCL 4 MG/2ML IJ SOLN
INTRAMUSCULAR | Status: DC | PRN
  Administered 2014-01-18: 4 mg via INTRAVENOUS

## 2014-01-18 MED ORDER — 0.9 % SODIUM CHLORIDE (POUR BTL) OPTIME
TOPICAL | Status: DC | PRN
  Administered 2014-01-18: 1000 mL

## 2014-01-18 MED ORDER — FENTANYL CITRATE 0.05 MG/ML IJ SOLN
INTRAMUSCULAR | Status: DC | PRN
  Administered 2014-01-18: 50 ug via INTRAVENOUS
  Administered 2014-01-18 (×4): 100 ug via INTRAVENOUS
  Administered 2014-01-18: 50 ug via INTRAVENOUS
  Administered 2014-01-18: 100 ug via INTRAVENOUS

## 2014-01-18 MED ORDER — ASPIRIN EC 325 MG PO TBEC: 325.0000 mg | DELAYED_RELEASE_TABLET | Freq: Every day | ORAL | Status: DC

## 2014-01-18 MED ORDER — HYDROMORPHONE HCL PF 1 MG/ML IJ SOLN
0.2500 mg | INTRAMUSCULAR | Status: DC | PRN
  Administered 2014-01-18 (×4): 0.5 mg via INTRAVENOUS

## 2014-01-18 MED ORDER — SODIUM CHLORIDE 0.9 % IJ SOLN: 3.0000 mL | INTRAMUSCULAR | Status: DC | PRN

## 2014-01-18 MED ORDER — OXYCODONE HCL 5 MG PO TABS: 5.0000 mg | ORAL_TABLET | Freq: Once | ORAL | Status: DC | PRN

## 2014-01-18 MED ORDER — DEXTROSE 5 % IV SOLN
500.0000 mg | Freq: Four times a day (QID) | INTRAVENOUS | Status: DC | PRN
  Filled 2014-01-18: qty 5

## 2014-01-18 MED ORDER — OXYCODONE HCL 5 MG PO TABS
10.0000 mg | ORAL_TABLET | ORAL | Status: DC | PRN
  Administered 2014-01-18 – 2014-01-19 (×5): 10 mg via ORAL
  Filled 2014-01-18 (×4): qty 2

## 2014-01-18 MED ORDER — PROPOFOL 10 MG/ML IV BOLUS
INTRAVENOUS | Status: DC | PRN
  Administered 2014-01-18: 150 mg via INTRAVENOUS
  Administered 2014-01-18: 50 mg via INTRAVENOUS

## 2014-01-18 MED ORDER — ASPIRIN EC 325 MG PO TBEC
325.0000 mg | DELAYED_RELEASE_TABLET | Freq: Every day | ORAL | Status: DC
  Administered 2014-01-19: 325 mg via ORAL
  Filled 2014-01-18: qty 1

## 2014-01-18 MED ORDER — CLONIDINE HCL 0.1 MG PO TABS
0.1000 mg | ORAL_TABLET | Freq: Two times a day (BID) | ORAL | Status: DC
  Administered 2014-01-18 – 2014-01-19 (×2): 0.1 mg via ORAL
  Filled 2014-01-18 (×3): qty 1

## 2014-01-18 MED ORDER — MIDAZOLAM HCL 2 MG/2ML IJ SOLN
INTRAMUSCULAR | Status: AC
  Filled 2014-01-18: qty 2

## 2014-01-18 MED ORDER — LIDOCAINE HCL (CARDIAC) 20 MG/ML IV SOLN
INTRAVENOUS | Status: AC
  Filled 2014-01-18: qty 5

## 2014-01-18 MED ORDER — PHENYLEPHRINE 40 MCG/ML (10ML) SYRINGE FOR IV PUSH (FOR BLOOD PRESSURE SUPPORT)
PREFILLED_SYRINGE | INTRAVENOUS | Status: AC
  Filled 2014-01-18: qty 10

## 2014-01-18 MED ORDER — BUPIVACAINE HCL (PF) 0.25 % IJ SOLN
INTRAMUSCULAR | Status: DC | PRN
  Administered 2014-01-18: 7 mL

## 2014-01-18 MED ORDER — MORPHINE SULFATE 2 MG/ML IJ SOLN
1.0000 mg | INTRAMUSCULAR | Status: DC | PRN
  Administered 2014-01-18 (×2): 4 mg via INTRAVENOUS
  Administered 2014-01-19 (×2): 2 mg via INTRAVENOUS
  Filled 2014-01-18 (×2): qty 2
  Filled 2014-01-18 (×2): qty 1

## 2014-01-18 MED ORDER — SUCCINYLCHOLINE CHLORIDE 20 MG/ML IJ SOLN
INTRAMUSCULAR | Status: AC
  Filled 2014-01-18: qty 1

## 2014-01-18 MED ORDER — LIDOCAINE HCL (CARDIAC) 20 MG/ML IV SOLN
INTRAVENOUS | Status: DC | PRN
  Administered 2014-01-18: 80 mg via INTRAVENOUS

## 2014-01-18 MED ORDER — FENTANYL CITRATE 0.05 MG/ML IJ SOLN
INTRAMUSCULAR | Status: AC
  Filled 2014-01-18: qty 5

## 2014-01-18 MED ORDER — ACETAMINOPHEN 325 MG PO TABS: 650.0000 mg | ORAL_TABLET | ORAL | Status: DC | PRN

## 2014-01-18 MED ORDER — CEFAZOLIN SODIUM 1-5 GM-% IV SOLN
1.0000 g | Freq: Three times a day (TID) | INTRAVENOUS | Status: AC
  Administered 2014-01-18 (×2): 1 g via INTRAVENOUS
  Filled 2014-01-18 (×2): qty 50

## 2014-01-18 MED ORDER — SODIUM CHLORIDE 0.9 % IR SOLN
Status: DC | PRN
  Administered 2014-01-18: 10:00:00

## 2014-01-18 MED ORDER — SODIUM CHLORIDE 0.9 % IJ SOLN
3.0000 mL | Freq: Two times a day (BID) | INTRAMUSCULAR | Status: DC
  Administered 2014-01-18: 3 mL via INTRAVENOUS

## 2014-01-18 MED ORDER — AMLODIPINE BESYLATE 10 MG PO TABS: 10.0000 mg | ORAL_TABLET | Freq: Every day | ORAL | Status: DC

## 2014-01-18 MED ORDER — EPHEDRINE SULFATE 50 MG/ML IJ SOLN
INTRAMUSCULAR | Status: DC | PRN
  Administered 2014-01-18: 10 mg via INTRAVENOUS

## 2014-01-18 MED ORDER — METHOCARBAMOL 500 MG PO TABS
ORAL_TABLET | ORAL | Status: AC
  Filled 2014-01-18: qty 1

## 2014-01-18 MED ORDER — PROMETHAZINE HCL 25 MG/ML IJ SOLN: 6.2500 mg | INTRAMUSCULAR | Status: DC | PRN

## 2014-01-18 MED ORDER — SODIUM CHLORIDE 0.9 % IV SOLN: 250.0000 mL | INTRAVENOUS | Status: DC

## 2014-01-18 MED ORDER — ZOLPIDEM TARTRATE 5 MG PO TABS
5.0000 mg | ORAL_TABLET | Freq: Every evening | ORAL | Status: DC | PRN
  Administered 2014-01-18: 5 mg via ORAL
  Filled 2014-01-18: qty 1

## 2014-01-18 MED ORDER — ARTIFICIAL TEARS OP OINT
TOPICAL_OINTMENT | OPHTHALMIC | Status: DC | PRN
  Administered 2014-01-18: 1 via OPHTHALMIC

## 2014-01-18 MED ORDER — PHENYLEPHRINE HCL 10 MG/ML IJ SOLN
INTRAMUSCULAR | Status: DC | PRN
Start: 2014-01-18 — End: 2014-01-18
  Administered 2014-01-18 (×2): 120 ug via INTRAVENOUS
  Administered 2014-01-18: 40 ug via INTRAVENOUS
  Administered 2014-01-18: 120 ug via INTRAVENOUS

## 2014-01-18 MED ORDER — ONDANSETRON HCL 4 MG/2ML IJ SOLN
INTRAMUSCULAR | Status: AC
  Filled 2014-01-18: qty 2

## 2014-01-18 MED ORDER — HYDROMORPHONE HCL PF 1 MG/ML IJ SOLN
INTRAMUSCULAR | Status: AC
  Filled 2014-01-18: qty 1

## 2014-01-18 MED ORDER — LACTATED RINGERS IV SOLN
INTRAVENOUS | Status: DC | PRN
  Administered 2014-01-18 (×2): via INTRAVENOUS

## 2014-01-18 MED ORDER — MIDAZOLAM HCL 5 MG/5ML IJ SOLN
INTRAMUSCULAR | Status: DC | PRN
  Administered 2014-01-18: 2 mg via INTRAVENOUS

## 2014-01-18 MED ORDER — ACETAMINOPHEN 650 MG RE SUPP: 650.0000 mg | RECTAL | Status: DC | PRN

## 2014-01-18 MED ORDER — ROCURONIUM BROMIDE 50 MG/5ML IV SOLN
INTRAVENOUS | Status: AC
  Filled 2014-01-18: qty 1

## 2014-01-18 MED ORDER — PHENOL 1.4 % MT LIQD: 1.0000 | OROMUCOSAL | Status: DC | PRN

## 2014-01-18 MED ORDER — LOSARTAN POTASSIUM 50 MG PO TABS
25.0000 mg | ORAL_TABLET | Freq: Every day | ORAL | Status: DC
  Administered 2014-01-19: 25 mg via ORAL
  Filled 2014-01-18: qty 1

## 2014-01-18 MED ORDER — DEXAMETHASONE SODIUM PHOSPHATE 4 MG/ML IJ SOLN: 4.0000 mg | Freq: Four times a day (QID) | INTRAMUSCULAR | Status: DC

## 2014-01-18 MED ORDER — AMLODIPINE BESYLATE 10 MG PO TABS
10.0000 mg | ORAL_TABLET | Freq: Every day | ORAL | Status: DC
Start: 2014-01-19 — End: 2014-01-19
  Administered 2014-01-19: 10 mg via ORAL
  Filled 2014-01-18: qty 1

## 2014-01-18 MED ORDER — EPHEDRINE SULFATE 50 MG/ML IJ SOLN
INTRAMUSCULAR | Status: AC
  Filled 2014-01-18: qty 1

## 2014-01-18 MED ORDER — SODIUM CHLORIDE 0.9 % IJ SOLN
INTRAMUSCULAR | Status: AC
  Filled 2014-01-18: qty 10

## 2014-01-18 MED ORDER — POTASSIUM CHLORIDE IN NACL 20-0.9 MEQ/L-% IV SOLN: INTRAVENOUS | Status: DC

## 2014-01-18 MED ORDER — METHOCARBAMOL 500 MG PO TABS
500.0000 mg | ORAL_TABLET | Freq: Four times a day (QID) | ORAL | Status: DC | PRN
  Administered 2014-01-18 (×2): 500 mg via ORAL
  Filled 2014-01-18: qty 1

## 2014-01-18 MED ORDER — OXYCODONE HCL 5 MG PO TABS
ORAL_TABLET | ORAL | Status: AC
  Filled 2014-01-18: qty 2

## 2014-01-18 MED ORDER — SUCCINYLCHOLINE CHLORIDE 20 MG/ML IJ SOLN
INTRAMUSCULAR | Status: DC | PRN
  Administered 2014-01-18: 110 mg via INTRAVENOUS

## 2014-01-18 MED ORDER — OXYCODONE HCL 5 MG/5ML PO SOLN: 5.0000 mg | Freq: Once | ORAL | Status: DC | PRN

## 2014-01-18 MED ORDER — CELECOXIB 200 MG PO CAPS
200.0000 mg | ORAL_CAPSULE | Freq: Two times a day (BID) | ORAL | Status: DC
  Administered 2014-01-19: 200 mg via ORAL
  Filled 2014-01-18: qty 1

## 2014-01-18 MED ORDER — ARTIFICIAL TEARS OP OINT
TOPICAL_OINTMENT | OPHTHALMIC | Status: AC
  Filled 2014-01-18: qty 3.5

## 2014-01-18 MED ORDER — MENTHOL 3 MG MT LOZG: 1.0000 | LOZENGE | OROMUCOSAL | Status: DC | PRN

## 2014-01-18 MED ORDER — PROPOFOL INFUSION 10 MG/ML OPTIME
INTRAVENOUS | Status: DC | PRN
  Administered 2014-01-18: 50 ug/kg/min via INTRAVENOUS

## 2014-01-18 SURGICAL SUPPLY — 67 items
ADH SKN CLS APL DERMABOND .7 (GAUZE/BANDAGES/DRESSINGS) ×1
APL SKNCLS STERI-STRIP NONHPOA (GAUZE/BANDAGES/DRESSINGS) ×1
BAG DECANTER FOR FLEXI CONT (MISCELLANEOUS) ×3 IMPLANT
BENZOIN TINCTURE PRP APPL 2/3 (GAUZE/BANDAGES/DRESSINGS) ×3 IMPLANT
BLADE SURG ROTATE 9660 (MISCELLANEOUS) IMPLANT
BOLT DECADE 5.5X40 (Bolt) ×1 IMPLANT
BOLT DECADE 5.5X40MM (Bolt) ×1 IMPLANT
BOLT SPNL LRG 45X5.5XPLAT NS (Screw) IMPLANT
BONE MATRIX OSTEOCEL PRO MED (Bone Implant) ×2 IMPLANT
CLOSURE WOUND 1/2 X4 (GAUZE/BANDAGES/DRESSINGS) ×1
CONT SPEC 4OZ CLIKSEAL STRL BL (MISCELLANEOUS) ×2 IMPLANT
COROENT XLW 10X22X45 (Cage) ×4 IMPLANT
COVER BACK TABLE 24X17X13 BIG (DRAPES) IMPLANT
COVER TABLE BACK 60X90 (DRAPES) ×2 IMPLANT
DERMABOND ADVANCED (GAUZE/BANDAGES/DRESSINGS) ×2
DERMABOND ADVANCED .7 DNX12 (GAUZE/BANDAGES/DRESSINGS) ×2 IMPLANT
DRAPE C-ARM 42X72 X-RAY (DRAPES) ×3 IMPLANT
DRAPE C-ARMOR (DRAPES) ×3 IMPLANT
DRAPE LAPAROTOMY 100X72X124 (DRAPES) ×3 IMPLANT
DRAPE POUCH INSTRU U-SHP 10X18 (DRAPES) ×3 IMPLANT
DRAPE SURG 17X23 STRL (DRAPES) ×12 IMPLANT
DRSG OPSITE 4X5.5 SM (GAUZE/BANDAGES/DRESSINGS) ×2 IMPLANT
DRSG TELFA 3X8 NADH (GAUZE/BANDAGES/DRESSINGS) IMPLANT
DURAPREP 26ML APPLICATOR (WOUND CARE) ×3 IMPLANT
ELECT REM PT RETURN 9FT ADLT (ELECTROSURGICAL) ×3
ELECTRODE REM PT RTRN 9FT ADLT (ELECTROSURGICAL) ×1 IMPLANT
GAUZE SPONGE 4X4 16PLY XRAY LF (GAUZE/BANDAGES/DRESSINGS) IMPLANT
GLOVE BIO SURGEON STRL SZ8 (GLOVE) ×5 IMPLANT
GLOVE BIOGEL PI IND STRL 8.5 (GLOVE) IMPLANT
GLOVE BIOGEL PI INDICATOR 8.5 (GLOVE) ×4
GOWN STRL REUS W/ TWL LRG LVL3 (GOWN DISPOSABLE) IMPLANT
GOWN STRL REUS W/ TWL XL LVL3 (GOWN DISPOSABLE) IMPLANT
GOWN STRL REUS W/TWL 2XL LVL3 (GOWN DISPOSABLE) IMPLANT
GOWN STRL REUS W/TWL LRG LVL3 (GOWN DISPOSABLE) ×3
GOWN STRL REUS W/TWL XL LVL3 (GOWN DISPOSABLE) ×6
HEMOSTAT POWDER KIT SURGIFOAM (HEMOSTASIS) IMPLANT
KIT BASIN OR (CUSTOM PROCEDURE TRAY) ×3 IMPLANT
KIT DILATOR XLIF 5 (KITS) IMPLANT
KIT MAXCESS (KITS) ×2 IMPLANT
KIT NDL NVM5 EMG ELECT (KITS) IMPLANT
KIT NEEDLE NVM5 EMG ELECT (KITS) ×1 IMPLANT
KIT NEEDLE NVM5 EMG ELECTRODE (KITS) ×2
KIT ROOM TURNOVER OR (KITS) ×3 IMPLANT
KIT XLIF (KITS) ×2
NDL HYPO 25X1 1.5 SAFETY (NEEDLE) ×1 IMPLANT
NEEDLE HYPO 25X1 1.5 SAFETY (NEEDLE) ×3 IMPLANT
NS IRRIG 1000ML POUR BTL (IV SOLUTION) ×3 IMPLANT
PACK LAMINECTOMY NEURO (CUSTOM PROCEDURE TRAY) ×3 IMPLANT
PAD DRESSING TELFA 3X8 NADH (GAUZE/BANDAGES/DRESSINGS) ×1 IMPLANT
PLATE 2H DECADE 8MM (Plate) ×2 IMPLANT
PROBE BALL TIP NVM5 SNG USE (BALLOONS) ×2 IMPLANT
PUTTY STIMUBLAST 2.5CC (Putty) ×2 IMPLANT
SCREW 45MM (Screw) ×3 IMPLANT
SPONGE LAP 4X18 X RAY DECT (DISPOSABLE) IMPLANT
STAPLER SKIN PROX WIDE 3.9 (STAPLE) ×2 IMPLANT
STRIP CLOSURE SKIN 1/2X4 (GAUZE/BANDAGES/DRESSINGS) ×2 IMPLANT
SUT VIC AB 0 CT1 18XCR BRD8 (SUTURE) ×1 IMPLANT
SUT VIC AB 0 CT1 8-18 (SUTURE) ×3
SUT VIC AB 2-0 CP2 18 (SUTURE) ×3 IMPLANT
SUT VIC AB 3-0 SH 8-18 (SUTURE) ×3 IMPLANT
SWABSTICK BENZOIN STERILE (MISCELLANEOUS) ×3 IMPLANT
SYR 20ML ECCENTRIC (SYRINGE) ×3 IMPLANT
TAPE CLOTH 3X10 TAN LF (GAUZE/BANDAGES/DRESSINGS) ×9 IMPLANT
TOWEL OR 17X24 6PK STRL BLUE (TOWEL DISPOSABLE) ×3 IMPLANT
TOWEL OR 17X26 10 PK STRL BLUE (TOWEL DISPOSABLE) ×3 IMPLANT
TRAY FOLEY CATH 14FRSI W/METER (CATHETERS) ×3 IMPLANT
WATER STERILE IRR 1000ML POUR (IV SOLUTION) ×3 IMPLANT

## 2014-01-18 NOTE — Progress Notes (Signed)
Chaplain responded to spiritual care consult for pt who "requests prayer." Pt stated she was in pain but welcomed my visit nonetheless. Pt clearly takes great joy in her faith. She expressed thanks to God for recovery from breast cancer in the past. Pt stated she had back surgery early this morning and was in considerable pain. We had prayer together. When I departed at 4:45 pt said it was time for her pain medication. I mentioned that to the nurse and she said she was just ready to bring it.

## 2014-01-18 NOTE — Evaluation (Signed)
Occupational Therapy Evaluation Patient Details Name: Monique Henderson MRN: [id number] DOB: [birth year] Today's Date: 01/18/2014    History of Present Illness 63 yo female s/p XlIF on left side L2-3. PMH - multiple back surg, hx of cocaine use, breast CA,    Clinical Impression   Patient is s/p xlif surgery resulting in functional limitations due to the deficits listed below (see OT problem list). PTA living at home at independent level. Patient will benefit from skilled OT acutely to increase independence and safety with ADLS to allow discharge [place]. OT to follow acutely for adl retraining with back precautions.      Follow Up Recommendations  Home health OT    Equipment Recommendations  None recommended by OT    Recommendations for Other Services       Precautions / Restrictions Precautions Precautions: Back Required Braces or Orthoses:  (none ordered or present)      Mobility Bed Mobility Overal bed mobility: Needs Assistance Bed Mobility: Rolling;Sidelying to Sit;Supine to Sit Rolling: Max assist Sidelying to sit: Max assist Supine to sit: Max assist     General bed mobility comments: max (A) for cues for safety  Transfers Overall transfer level: Needs assistance Equipment used: 1 person hand held assist Transfers: Stand Pivot Transfers   Stand pivot transfers: Max assist            Balance Overall balance assessment: Needs assistance Sitting-balance support: No upper extremity supported;Feet supported Sitting balance-Leahy Scale: Poor     Standing balance support: No upper extremity supported;During functional activity Standing balance-Leahy Scale: Poor                              ADL Overall ADL's : Needs assistance/impaired Eating/Feeding: Minimal assistance;Bed level;Sitting (cues for caregiver friend present to incr HOB to prevent asp) Eating/Feeding Details (indicate cue type and reason): prevent aspiration Grooming: Wash/dry  face;Minimal assistance;Sitting                   Toilet Transfer: Maximal assistance;Stand-pivot;BSC Toilet Transfer Details (indicate cue type and reason): pt restless on bSC and required OT to help maintain sitting on BSC. Pt twisting and pushing back against DME. pt needed cues to unwrap bil LE from legs of BSC. pt very impulsive and restless. Pt reports need to void however unable to do so. pt reports need to change position but not followign commands           General ADL Comments: Pt educated on bed mobility and transfer with back precautions. pt with no recall. Ot keeping handout due to pt is not ready to sustain information. pt with cognitive deficits at this time. Questions baseline vs s/p surgery ? Ot to provide next session when more appropriate     Vision                     Perception     Praxis      Pertinent Vitals/Pain Pain Assessment: 0-10 Pain Score: 8  Pain Intervention(s): Repositioned     Hand Dominance Right   Extremity/Trunk Assessment Upper Extremity Assessment Upper Extremity Assessment: Overall WFL for tasks assessed   Lower Extremity Assessment Lower Extremity Assessment: Defer to PT evaluation   Cervical / Trunk Assessment Cervical / Trunk Assessment: Normal   Communication Communication Communication: No difficulties   Cognition Arousal/Alertness: Awake/alert Behavior During Therapy: Restless;Impulsive Overall Cognitive Status: Impaired/Different from baseline Area of Impairment: Orientation;Attention;Memory;Following  commands;Safety/judgement;Awareness;Problem solving Orientation Level: Disoriented to;Time Current Attention Level: Focused Memory: Decreased recall of precautions;Decreased short-term memory Following Commands: Follows one step commands inconsistently;Follows one step commands with increased time Safety/Judgement: Decreased awareness of safety;Decreased awareness of deficits Awareness: Intellectual Problem  Solving: Slow processing;Decreased initiation General Comments: Pt not following precautions, pt unable to repeat precautions to therapist. Pt fixated on voiding bladder. Pt needed same cues multiple times and still return demo. Pt attempting to twist throughout session   General Comments       Exercises       Shoulder Instructions      Home Living Family/patient expects to be discharged to:: Private residence (second floor apt) Living Arrangements: Alone Available Help at Discharge: Friend(s);Available PRN/intermittently Type of Home: Apartment Home Access: Other (comment) (lift chair)     Home Layout: One level     Bathroom Shower/Tub: Teacher, early years/pre: Handicapped height     Home Equipment: None   Additional Comments: pt lives alone and boyfriend (A) patient      Prior Functioning/Environment Level of Independence: Independent             OT Diagnosis: Generalized weakness;Cognitive deficits;Acute pain   OT Problem List: Decreased strength;Decreased activity tolerance;Impaired balance (sitting and/or standing);Decreased safety awareness;Decreased cognition;Decreased knowledge of use of DME or AE;Decreased knowledge of precautions;Pain   OT Treatment/Interventions: Self-care/ADL training;Therapeutic exercise;DME and/or AE instruction;Therapeutic activities;Cognitive remediation/compensation;Patient/family education;Balance training    OT Goals(Current goals can be found in the care plan section) Acute Rehab OT Goals Patient Stated Goal: "to pee" OT Goal Formulation: Patient unable to participate in goal setting Time For Goal Achievement: 02/01/14 Potential to Achieve Goals: Good  OT Frequency: Min 2X/week   Barriers to D/C: Decreased caregiver support          Co-evaluation              End of Session Nurse Communication: Mobility status;Precautions  Activity Tolerance: Patient tolerated treatment well Patient left: in bed;with  call bell/phone within reach;with bed alarm set   Time: 1400-1421 OT Time Calculation (min): 21 min Charges:  OT General Charges $OT Visit: 1 Procedure OT Evaluation $Initial OT Evaluation Tier I: 1 Procedure OT Treatments $Self Care/Home Management : 8-22 mins G-Codes:    Peri Maris 01-31-2014, 3:26 PM Pager: [phone]

## 2014-01-18 NOTE — OR Nursing (Signed)
#  1 Allergies reviewed with patient, shrimp is stated as an allergy. Shellfish holds a low/undetermined potential for cross reaction with latex allergy. Patient stated pre-operatively that she had no reactions previously with latex or betadine topically.  #2 Needles placed for motor nerve monitoring by RN. Sites were assessed by RN prior to insertion and identified with help of a representative. Cleaned with alcohol swabs and allowed to dry before insertion. Removal will be performed by RN and re-assessed after completion of surgery per usual practice.

## 2014-01-18 NOTE — Anesthesia Preprocedure Evaluation (Addendum)
Anesthesia Evaluation  Patient identified by MRN, date of birth, ID band Patient awake    Reviewed: Allergy & Precautions, H&P , NPO status , Patient's Chart, lab work & pertinent test results  History of Anesthesia Complications Negative for: history of anesthetic complications  Airway Mallampati: II TM Distance: >3 FB Neck ROM: Full    Dental  (+) Dental Advisory Given, Edentulous Upper, Missing, Poor Dentition   Pulmonary Current Smoker,  07-12-12  Chest x-ray Findings: Stable mild cardiomegaly.  Slightly tortuous thoracic aorta contour.  Hilar contours normal.  The trachea is midline. The lungs are normally expanded and clear.  The pulmonary vascularity is normal.  There is no pleural effusion, airspace disease, or pneumothorax.  There are typical degenerative changes of the thoracic spine.  No acute or suspicious bony abnormality.   breath sounds clear to auscultation  Pulmonary exam normal       Cardiovascular Exercise Tolerance: Good hypertension, Pt. on medications Rhythm:Regular Rate:Normal  12-Oct-2012 12:30:18 [facility] ROUTINE RECORD Sinus bradycardia Possible Left atrial enlargement Septal infarct , age undetermined Abnormal ECG   Neuro/Psych PSYCHIATRIC DISORDERS Chronic back pain: narcotics daily    GI/Hepatic GERD-  Medicated and Controlled,(+)     substance abuse (pt very agitated, denies any drug use since 80's, Dr. Ronnald Ramp aware of patient's pos cocaine screen 9/14,  wishes to proceed, as does pt)  cocaine use, Hepatitis -, C  Endo/Other  negative endocrine ROS  Renal/GU      Musculoskeletal  (+) Arthritis -, Osteoarthritis,  02-15-13 Left hip x-ray FINDINGS: Mild joint space narrowing left hip. Negative for fracture or mass. Negative for AVN.   Spinous process fusion device at L4-5.     Abdominal Normal abdominal exam  (+)   Peds  Hematology negative hematology  ROS (+)   Anesthesia Other Findings   Reproductive/Obstetrics                          Anesthesia Physical Anesthesia Plan  ASA: III  Anesthesia Plan: General   Post-op Pain Management:    Induction:   Airway Management Planned:   Additional Equipment:   Intra-op Plan:   Post-operative Plan:   Informed Consent: I have reviewed the patients History and Physical, chart, labs and discussed the procedure including the risks, benefits and alternatives for the proposed anesthesia with the patient or authorized representative who has indicated his/her understanding and acceptance.     Plan Discussed with:   Anesthesia Plan Comments:         Anesthesia Quick Evaluation

## 2014-01-18 NOTE — Progress Notes (Signed)
Dr. Rica Koyanagi notified that patient had lifesaver in her mouth on arrival at 0700, and stated that she had 2 lifesavers. Dr. Orene Desanctis stated to make sure that is all she has had or used. Patient questioned by pre-op RN, Beverly Gust, and patient denies any other food drink or substance use.

## 2014-01-18 NOTE — Progress Notes (Signed)
Utilization Review Completed.Donne Anon T8/20/2015

## 2014-01-18 NOTE — Consult Note (Signed)
Subjective: Patient is a 63 y.o. female admitted for XLIF. Onset of symptoms was several months ago, gradually worsening since that time.  The pain is rated severe, and is located at the across the lower back and radiates to LLE. The pain is described as aching and occurs all day. The symptoms have been progressive. Symptoms are exacerbated by exercise and standing. MRI or CT showed stenosis L2-3   Past Medical History  Diagnosis Date  . GERD (gastroesophageal reflux disease)   . Hepatitis C   . Hypertension   . Low back pain   . Uterine fibroid   . Normocytic anemia     With thrombocytosis  . Right ureteral stone 2002  . Atelectasis 2002    Bilateral  . Bone spur 2008    Right calcaneal foot spur  . Lumbar spinal stenosis     S/P lumbar decompressive laminectomy, fusion and plating for lumbar spinal stensosis  . Hot flashes   . Wears dentures     top  . Hx of radiation therapy 2005    left breast  . 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 Autumn Messing; S/P re-excision of superior and inferior margins 10/27/2012.   . Osteoarthritis   . Shortness of breath     from pain  . Breast cancer 2004    Ductal carcinoma in situ of the left breast; S/P left partial mastectomy 02/26/2003; S/P re-excision of cranial and lateral margins11/18/2004.radiation  . Breast cancer 09/21/2012    right breast/ last radiation treatment 03/22/2013  . Hx of radiation therapy 01/11/13- 03/22/13    right breast 5760 cGy 30 sessions    Past Surgical History  Procedure Laterality Date  . Laminectomy  05/27/2009    Lumbar decompressive laminectomy, fusion and plating for lumbar spinal stensosis  . Breast lumpectomy Left 2011  . Mastectomy, partial Left 02/26/2003    S/P left partial mastectomy 02/26/2003; S/P re-excision of cranial and lateral margins11/18/2004.   . Breast lumpectomy with needle localization and axillary sentinel lymph node bx Right  10/13/2012    Procedure: BREAST LUMPECTOMY WITH NEEDLE LOCALIZATION;  Surgeon: [name], MD;  Location: [facility];  Service: General;  Laterality: Right;  Right breast wire localized lumpectomy  . Re-excision of breast cancer,superior margins Right 10/27/2012    Procedure: RE-EXCISION OF BREAST CANCER,SUPERIOR and inferior MARGINS;  Surgeon: [name], MD;  Location: [facility];  Service: General;  Laterality: Right;  . Back surgery    . Lumbar laminectomy/decompression microdiscectomy Left 03/23/2013    Procedure: LUMBAR LAMINECTOMY/DECOMPRESSION MICRODISCECTOMY 1 LEVEL;  Surgeon: Eustace Moore, MD;  Location: [facility];  Service: Neurosurgery;  Laterality: Left;  LUMBAR LAMINECTOMY/DECOMPRESSION MICRODISCECTOMY 1 LEVEL    Prior to Admission medications   Medication Sig Start Date End Date Taking? Authorizing Provider  amLODipine (NORVASC) 10 MG tablet Take 1 tablet (10 mg total) by mouth daily. 12/06/13  Yes Axel Filler, MD  aspirin 325 MG EC tablet Take 325 mg by mouth daily.   Yes Historical Provider, MD  B Complex Vitamins (VITAMIN B COMPLEX PO) Take 1 tablet by mouth daily.    Yes Historical Provider, MD  cetirizine (ZYRTEC) 10 MG tablet Take 10 mg by mouth daily.   Yes Historical Provider, MD  cloNIDine (CATAPRES) 0.1 MG tablet Take 1 tablet (0.1 mg total) by mouth 2 (two) times daily. 12/06/13  Yes Guerry Bruin Joines,  MD  losartan (COZAAR) 25 MG tablet Take 1 tablet (25 mg total) by mouth daily. 12/06/13  Yes Axel Filler, MD  oxyCODONE (OXY IR/ROXICODONE) 5 MG immediate release tablet Take 1 tablet (5 mg total) by mouth every 6 (six) hours as needed for severe pain. 12/06/13  Yes Axel Filler, MD  traMADol (ULTRAM) 50 MG tablet Take 1 tablet (50 mg total) by mouth every 6 (six) hours as needed (for pain). 12/27/13  Yes Axel Filler, MD  zolpidem (AMBIEN) 5 MG tablet Take 1 tablet (5 mg total) by mouth at bedtime as needed for sleep. 10/10/13  Yes Axel Filler, MD   Allergies  Allergen Reactions  . Shrimp [Shellfish Allergy] Shortness Of Breath  . Bactroban [Mupirocin] Other (See Comments)    "Sores in nose"  . Lisinopril Cough  . Tylenol [Acetaminophen] Itching  . Vicodin [Hydrocodone-Acetaminophen] Itching and Nausea And Vomiting    This is patient's home medication    History  Substance Use Topics  . Smoking status: Current Some Day Smoker -- 0.25 packs/day for 40 years    Types: Cigarettes  . Smokeless tobacco: Never Used  . Alcohol Use: 1.2 oz/week    2 Cans of beer per week     Comment:  "couple beers q weekend"    Family History  Problem Relation Age of Onset  . Colon cancer Mother 88  . Hypertension Mother   . Diabetes Sister 46  . Hypertension Sister   . Breast cancer Neg Hx   . Cervical cancer Neg Hx   . Diabetes Brother   . Hypertension Brother   . Diabetes Brother   . Hypertension Brother   . Kidney disease Son     On dialysis  . Hypertension Son   . Diabetes Son   . Multiple sclerosis Son   . Bone cancer Sister 25     Review of Systems  Positive ROS: neg  All other systems have been reviewed and were otherwise negative with the exception of those mentioned in the HPI and as above.  Objective: Vital signs in last 24 hours: Temp:  [97.9 F (36.6 C)] 97.9 F (36.6 C) (08/20 0710) Pulse Rate:  [74] 74 (08/20 0710) Resp:  [18] 18 (08/20 0710) BP: (145)/(95) 145/95 mmHg (08/20 0710) SpO2:  [100 %] 100 % (08/20 0710) Weight:  [63.05 kg (139 lb)] 63.05 kg (139 lb) (08/20 0710)  General Appearance: Alert, cooperative, no distress, appears stated age Head: Normocephalic, without obvious abnormality, atraumatic Eyes: PERRL, conjunctiva/corneas clear, EOM's intact    Neck: Supple, symmetrical, trachea midline Back: Symmetric, no curvature, ROM normal, no CVA tenderness Lungs:  respirations unlabored Heart: Regular rate and rhythm Abdomen: Soft, non-tender Extremities: Extremities normal,  atraumatic, no cyanosis or edema Pulses: 2+ and symmetric all extremities Skin: Skin color, texture, turgor normal, no rashes or lesions  NEUROLOGIC:   Mental status: Alert and oriented x4,  no aphasia, good attention span, fund of knowledge, and memory Motor Exam - grossly normal Sensory Exam - grossly normal Reflexes: 1+ Coordination - grossly normal Gait - grossly normal Balance - grossly normal Cranial Nerves: I: smell Not tested  II: visual acuity  OS: nl    OD: nl  II: visual fields Full to confrontation  II: pupils Equal, round, reactive to light  III,VII: ptosis None  III,IV,VI: extraocular muscles  Full ROM  V: mastication Normal  V: facial light touch sensation  Normal  V,VII: corneal reflex  Present  VII: facial muscle function - upper  Normal  VII: facial muscle function - lower Normal  VIII: hearing Not tested  IX: soft palate elevation  Normal  IX,X: gag reflex Present  XI: trapezius strength  5/5  XI: sternocleidomastoid strength 5/5  XI: neck flexion strength  5/5  XII: tongue strength  Normal    Data Review Lab Results  Component Value Date   WBC 5.0 01/08/2014   HGB 13.0 01/08/2014   HCT 40.4 01/08/2014   MCV 94.0 01/08/2014   PLT 390 01/08/2014   Lab Results  Component Value Date   NA 143 01/08/2014   K 4.3 01/08/2014   CL 104 01/08/2014   CO2 25 01/08/2014   BUN 21 01/08/2014   CREATININE 1.40* 01/08/2014   GLUCOSE 92 01/08/2014   Lab Results  Component Value Date   INR 0.87 01/08/2014    Assessment/Plan: Patient admitted for XLIF L2-3. Patient has failed a reasonable attempt at conservative therapy.  I explained the condition and procedure to the patient and answered any questions.  Patient wishes to proceed with procedure as planned. Understands risks/ benefits and typical outcomes of procedure.   Codylee Patil S 01/18/2014 7:37 AM

## 2014-01-18 NOTE — Anesthesia Postprocedure Evaluation (Signed)
  Anesthesia Post-op Note  Patient: Monique Henderson  Procedure(s) Performed: Procedure(s): ANTERIOR LATERAL LUMBAR FUSION LUMBAR TWO-THREE (N/A)  Patient Location: PACU  Anesthesia Type:General  Level of Consciousness: awake and alert   Airway and Oxygen Therapy: Patient Spontanous Breathing  Post-op Pain: mild  Post-op Assessment: Post-op Vital signs reviewed  Post-op Vital Signs: stable  Last Vitals:  Filed Vitals:   01/18/14 1345  BP: 105/51  Pulse: 78  Temp: 36.2 C  Resp: 18    Complications: No apparent anesthesia complications

## 2014-01-18 NOTE — Transfer of Care (Signed)
Immediate Anesthesia Transfer of Care Note  Patient: Monique Henderson  Procedure(s) Performed: Procedure(s): ANTERIOR LATERAL LUMBAR FUSION LUMBAR TWO-THREE (N/A)  Patient Location: PACU  Anesthesia Type:General  Level of Consciousness: lethargic and responds to stimulation  Airway & Oxygen Therapy: Patient Spontanous Breathing and non-rebreather face mask  Post-op Assessment: Report given to PACU RN and Patient moving all extremities X 4  Post vital signs: Reviewed and stable  Complications: No apparent anesthesia complications

## 2014-01-18 NOTE — Anesthesia Procedure Notes (Signed)
Procedure Name: Intubation Date/Time: 01/18/2014 7:52 AM Performed by: Sampson Si E Pre-anesthesia Checklist: Patient identified, Emergency Drugs available, Suction available, Patient being monitored and Timeout performed Patient Re-evaluated:Patient Re-evaluated prior to inductionOxygen Delivery Method: Circle system utilized Preoxygenation: Pre-oxygenation with 100% oxygen Intubation Type: IV induction Ventilation: Mask ventilation without difficulty Laryngoscope Size: Mac and 3 Grade View: Grade I Tube type: Oral Airway Equipment and Method: Stylet Placement Confirmation: ETT inserted through vocal cords under direct vision,  positive ETCO2 and breath sounds checked- equal and bilateral Secured at: 21 cm Tube secured with: Tape Dental Injury: Teeth and Oropharynx as per pre-operative assessment

## 2014-01-18 NOTE — Op Note (Signed)
01/18/2014  11:03 AM  PATIENT:  Monique Henderson  63 y.o. 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 Anterolateral retroperitoneal interbody fusion L2-3 utilizing a 8 mm peek interbody cage packed with morcellized allograft, 2. Anterior lumbar plating L2-3  SURGEON:  Sherley Bounds, MD  ASSISTANTS: None  ANESTHESIA:   General  EBL: 50 ml  Total I/O In: 1500 [I.V.:1500] Out: 150 [Urine:100; Blood:50]  BLOOD ADMINISTERED:none  DRAINS: None   SPECIMEN:  No Specimen  INDICATION FOR PROCEDURE: This patient underwent a left L2-3 microdiscectomy in the remote past. She presented with recurrent severe left anterior thigh pain in an L3 distribution. MRI showed residual stenosis and recurrent disc herniation L2-3 on the left. She tried medical management without relief. Recommended anterolateral retroperitoneal interbody fusion L2-3 in hopes of improvement of her pain syndrome. Patient understood the risks, benefits, and alternatives and potential outcomes and wished to proceed.  PROCEDURE DETAILS: The patient was brought to the operating room and after induction of adequate generalized endotracheal anesthesia, the patient was positioned in the right lateral decubitus position exposing the left side. The patient was positioned in the typical XLIF fashion and taped into position and trajectories were confirmed with AP lateral fluoroscopy. The skin was cleaned with Hibiclens and then prepped with DuraPrep and draped in the usual sterile fashion. 5 cc of local anesthesia was injected. A lateral incision was made over the appropriate disc space and a incision was made posterior lateral to this. Blunt finger dissection was used to enter the retroperitoneal space. I could palpate the psoas musculature as well as the anterior face of the transverse process, and I could feel the fatty  tissues of the retroperitoneum. I swept my finger up to the lateral incision and passed my first dilator down to psoas musculature utilizing my index finger. We then used EMG monitoring to monitor our dilator. We checked our position with fluoroscopy and then placed a K wire into the disc space, and then used sequential dilators until our final retractor was in place. We then positioned it utilizing AP lateral fluoroscopy, and used our ball probe to make sure there were no neural structures within the working channel. I then placed the intradiscal shim, and opened my retractor further. The annulus was then incised and the discectomy was done with pituitaries. The annulus was removed from the endplates with a Cobb and the opposite annulus was opened and released. I then used a series of scrapers and shavers to prepare the endplates, taking care not to violate the endplates. I then placed my 16 mm x 18 mm paddle across the disc space to further open opposite annulus and I checked the trajectory with AP and lateral fluoroscopy. I then used sequential trials to determine the correct size cage. The 8 lordotic by 22 x 45 mm trial fit the best, so a corresponding cage was selected and packed with morcellized allograft. Using sliders it was then tapped gently into the disc space utilizing AP fluoroscopy until it was appropriately positioned. I then checked the placement of my graft with AP and lateral fluoroscopy. I then opened the retractor somewhat further in order to place a single hole plate over the interspace. I checked the placement of the plate with AP and lateral fluoroscopy. I then used the awl and drill to create a path for screws. A 40 mm screw was used at L2  and a 45 mm screws used at L3 in order to obtain bicortical purchase. The screws were placed through the plate and locked into position. I then irrigated with saline solution containing bacitracin and dried any bleeding points. I checked my final construct  with AP lateral fluoroscopy, removed my retractor, and closed the incisions with layers of 0 Vicryl in the fascia, 2-0 Vicryl in the subcutaneous tissues, and 3-0 Vicryl in the subcuticular tissues. The skin was closed with Dermabond. The patient was then awakened from general anesthesia and transported to the recovery room in stable condition. At the end of the procedure all sponge, needle, and instrument counts were correct.   PLAN OF CARE: Admit to inpatient   PATIENT DISPOSITION:  PACU - hemodynamically stable.   Delay start of Pharmacological VTE agent (>24hrs) due to surgical blood loss or risk of bleeding:  yes

## 2014-01-18 NOTE — Progress Notes (Signed)
Patient arrived via stretcher from PACU.  Patient alert and oriented and was able to move herself onto the bed.  She was oriented to [facility] policies and procedures and her foley was removed.  Will continue to monitor.  Kizzie Bane, RN

## 2014-01-19 MED ORDER — OXYCODONE HCL 10 MG PO TABS: 10.0000 mg | ORAL_TABLET | Freq: Four times a day (QID) | ORAL | Status: DC | PRN

## 2014-01-19 MED ORDER — METHOCARBAMOL 500 MG PO TABS: 500.0000 mg | ORAL_TABLET | Freq: Four times a day (QID) | ORAL | Status: DC | PRN

## 2014-01-19 NOTE — Evaluation (Signed)
Physical Therapy Evaluation Patient Details Name: Monique Henderson MRN: [id number] DOB: 12/13/1950 Today's Date: 01/19/2014   History of Present Illness  63 yo female s/p XlIF on left side L2-3. PMH - multiple back surg, hx of cocaine use, breast CA,   Clinical Impression  All education completed, pt demo'd understanding at the time of education.  Pt generally at a modified Independent level.  Will sign off at this time.    Follow Up Recommendations No PT follow up    Equipment Recommendations  None recommended by PT    Recommendations for Other Services       Precautions / Restrictions Precautions Precautions: Back Required Braces or Orthoses: Spinal Brace Spinal Brace: Lumbar corset;Applied in sitting position      Mobility  Bed Mobility Overal bed mobility: Modified Independent Bed Mobility: Sidelying to Sit;Rolling;Sit to Sidelying Rolling: Modified independent (Device/Increase time) Sidelying to sit: Modified independent (Device/Increase time)     Sit to sidelying: Modified independent (Device/Increase time) General bed mobility comments: Reinforced safe technique and practiced to help with carryover  Transfers Overall transfer level: Modified independent               General transfer comment: generally safe  Ambulation/Gait Ambulation/Gait assistance: Modified independent (Device/Increase time);Independent Ambulation Distance (Feet): 500 Feet   Gait Pattern/deviations: WFL(Within Functional Limits) Gait velocity: slower   General Gait Details: steady and safe  Stairs Stairs: Yes Stairs assistance: Modified independent (Device/Increase time) Stair Management: One rail Left;Alternating pattern;Forwards Number of Stairs: 5 General stair comments: steady with rail  Wheelchair Mobility    Modified Rankin (Stroke Patients Only)       Balance Overall balance assessment: No apparent balance deficits (not formally assessed)   Sitting  balance-Leahy Scale: Good       Standing balance-Leahy Scale: Good                               Pertinent Vitals/Pain Pain Assessment: Faces Faces Pain Scale: Hurts a little bit Pain Location: L ant. thight Pain Intervention(s): Repositioned    Home Living Family/patient expects to be discharged to:: Private residence Living Arrangements: Alone Available Help at Discharge: Friend(s);Available PRN/intermittently Type of Home: Apartment Home Access: Other (comment)     Home Layout: One level Home Equipment: None Additional Comments: pt lives alone and boyfriend (A) patient    Prior Function Level of Independence: Independent               Hand Dominance   Dominant Hand: Right    Extremity/Trunk Assessment               Lower Extremity Assessment: Overall WFL for tasks assessed      Cervical / Trunk Assessment: Normal  Communication   Communication: No difficulties  Cognition Arousal/Alertness: Awake/alert Behavior During Therapy: Impulsive;WFL for tasks assessed/performed Overall Cognitive Status: Within Functional Limits for tasks assessed         Following Commands: Follows one step commands consistently Safety/Judgement: Decreased awareness of safety;Decreased awareness of deficits          General Comments General comments (skin integrity, edema, etc.): Reinforced all back care/precautions, log roll and bed mobility issues, bracing issues, lifting precautions and typical progression of activity.    Exercises        Assessment/Plan    PT Assessment Patent does not need any further PT services  PT Diagnosis     PT Problem List  PT Treatment Interventions     PT Goals (Current goals can be found in the Care Plan section) Acute Rehab PT Goals PT Goal Formulation: No goals set, d/c therapy    Frequency     Barriers to discharge        Co-evaluation               End of Session   Activity Tolerance:  Patient tolerated treatment well Patient left:  (up in room) Nurse Communication: Mobility status         Time: [id number] PT Time Calculation (min): 22 min   Charges:   PT Evaluation $Initial PT Evaluation Tier I: 1 Procedure PT Treatments $Gait Training: 8-22 mins   PT G Codes:          Yoshito Gaza, Tessie Fass 01/19/2014, 10:30 AM 01/19/2014  Donnella Sham, PT [phone]  (pager)

## 2014-01-19 NOTE — Progress Notes (Signed)
IV removed and prescription for pain medicine given to patient.  Education and paperwork complete.  Kizzie Bane, RN

## 2014-01-19 NOTE — Progress Notes (Signed)
Patient awaiting ride for discharge cannot send to discharge lounge because patient requires pain mgmt.  Kizzie Bane, RN

## 2014-01-19 NOTE — Discharge Summary (Signed)
Physician Discharge Summary  Patient ID: Monique Henderson MRN: [id number] DOB/AGE: June 05, 1950 63 y.o.  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 The patient tolerated the procedure well and was taken to the recovery room and then to the floor in stable condition. The hospital course was routine. There were no complications. The wound remained clean dry and intact. Pt had appropriate back soreness. No complaints of leg pain or new N/T/W. The patient remained afebrile with stable vital signs, and tolerated a regular diet. The patient continued to increase activities, and pain was well controlled with oral pain medications.   Consults: None  Significant Diagnostic Studies:  Results for orders placed during the hospital encounter of 01/08/14  SURGICAL PCR SCREEN      Result Value Ref Range   MRSA, PCR NEGATIVE  NEGATIVE   Staphylococcus aureus NEGATIVE  NEGATIVE  CBC WITH DIFFERENTIAL      Result Value Ref Range   WBC 5.0  4.0 - 10.5 K/uL   RBC 4.30  3.87 - 5.11 MIL/uL   Hemoglobin 13.0  12.0 - 15.0 g/dL   HCT 40.4  36.0 - 46.0 %   MCV 94.0  78.0 - 100.0 fL   MCH 30.2  26.0 - 34.0 pg   MCHC 32.2  30.0 - 36.0 g/dL   RDW 13.8  11.5 - 15.5 %   Platelets 390  150 - 400 K/uL   Neutrophils Relative % 47  43 - 77 %   Neutro Abs 2.4  1.7 - 7.7 K/uL   Lymphocytes Relative 33  12 - 46 %   Lymphs Abs 1.7  0.7 - 4.0 K/uL   Monocytes Relative 10  3 - 12 %   Monocytes Absolute 0.5  0.1 - 1.0 K/uL   Eosinophils Relative 9 (*) 0 - 5 %   Eosinophils Absolute 0.4  0.0 - 0.7 K/uL   Basophils Relative 1  0 - 1 %   Basophils Absolute 0.0  0.0 - 0.1 K/uL  PROTIME-INR      Result Value Ref Range   Prothrombin Time 11.8  11.6 - 15.2 seconds   INR 0.87  0.00 -  1.49  COMPREHENSIVE METABOLIC PANEL      Result Value Ref Range   Sodium 143  137 - 147 mEq/L   Potassium 4.3  3.7 - 5.3 mEq/L   Chloride 104  96 - 112 mEq/L   CO2 25  19 - 32 mEq/L   Glucose, Bld 92  70 - 99 mg/dL   BUN 21  6 - 23 mg/dL   Creatinine, Ser 1.40 (*) 0.50 - 1.10 mg/dL   Calcium 9.4  8.4 - 10.5 mg/dL   Total Protein 7.2  6.0 - 8.3 g/dL   Albumin 3.5  3.5 - 5.2 g/dL   AST 69 (*) 0 - 37 U/L   ALT 66 (*) 0 - 35 U/L   Alkaline Phosphatase 111  39 - 117 U/L   Total Bilirubin <0.2 (*) 0.3 - 1.2 mg/dL   GFR calc non Af Amer 40 (*) >90 mL/min   GFR calc Af Amer 46 (*) >90 mL/min   Anion gap 14  5 - 15  TYPE AND SCREEN      Result Value Ref Range   ABO/RH(D) Jenetta Downer  POS     Antibody Screen NEG     Sample Expiration 01/22/2014      Dg Lumbar Spine 2-3 Views  01/18/2014   CLINICAL DATA:  Lumbar spine fusion images  EXAM: LUMBAR SPINE - 2-3 VIEW; DG C-ARM 61-120 MIN  COMPARISON:  11/13/2013  FINDINGS: Two images show placement of a left lateral fixation plate and screws at what appears to be L2-L3. Orthopedic hardware is well-seated. Radiolucent disc spacer projects within the disc space partly maintaining disc height. No evidence of an operative complication.  IMPRESSION: Operative image shows during lumbar spine fusion as described.   Electronically Signed   By: Lajean Manes M.D.   On: 01/18/2014 11:16   Dg C-arm 61-120 Min  01/18/2014   CLINICAL DATA:  Lumbar spine fusion images  EXAM: LUMBAR SPINE - 2-3 VIEW; DG C-ARM 61-120 MIN  COMPARISON:  11/13/2013  FINDINGS: Two images show placement of a left lateral fixation plate and screws at what appears to be L2-L3. Orthopedic hardware is well-seated. Radiolucent disc spacer projects within the disc space partly maintaining disc height. No evidence of an operative complication.  IMPRESSION: Operative image shows during lumbar spine fusion as described.   Electronically Signed   By: Lajean Manes M.D.   On: 01/18/2014 11:16     Antibiotics:  Anti-infectives   Start     Dose/Rate Route Frequency Ordered Stop   01/18/14 1600  ceFAZolin (ANCEF) IVPB 1 g/50 mL premix     1 g 100 mL/hr over 30 Minutes Intravenous Every 8 hours 01/18/14 1330 01/19/14 0025   01/18/14 0947  bacitracin 50,000 Units in sodium chloride irrigation 0.9 % 500 mL irrigation  Status:  Discontinued       As needed 01/18/14 0947 01/18/14 1129   01/18/14 0600  ceFAZolin (ANCEF) IVPB 2 g/50 mL premix     2 g 100 mL/hr over 30 Minutes Intravenous On call to O.R. 01/17/14 1421 01/18/14 0805      Discharge Exam: Blood pressure 150/86, pulse 88, temperature 98.6 F (37 C), temperature source Oral, resp. rate 18, height 5' 2.99" (1.6 m), weight 63.1 kg (139 lb 1.8 oz), SpO2 100.00%. Neurologic: Grossly normal incisions CDI  Discharge Medications:     Medication List    STOP taking these medications       traMADol 50 MG tablet  Commonly known as:  ULTRAM      TAKE these medications       amLODipine 10 MG tablet  Commonly known as:  NORVASC  Take 1 tablet (10 mg total) by mouth daily.     aspirin 325 MG EC tablet  Take 325 mg by mouth daily.     cetirizine 10 MG tablet  Commonly known as:  ZYRTEC  Take 10 mg by mouth daily.     cloNIDine 0.1 MG tablet  Commonly known as:  CATAPRES  Take 1 tablet (0.1 mg total) by mouth 2 (two) times daily.     losartan 25 MG tablet  Commonly known as:  COZAAR  Take 1 tablet (25 mg total) by mouth daily.     methocarbamol 500 MG tablet  Commonly known as:  ROBAXIN  Take 1 tablet (500 mg total) by mouth every 6 (six) hours as needed for muscle spasms.     Oxycodone HCl 10 MG Tabs  Take 1 tablet (10 mg total) by mouth every 6 (six) hours as needed for moderate pain.     VITAMIN B COMPLEX PO  Take 1 tablet by mouth daily.     zolpidem 5 MG tablet  Commonly known as:  AMBIEN  Take 1 tablet (5 mg total) by mouth at bedtime as needed for sleep.        Disposition: home   Final Dx:  XLIF L2-3      Discharge Instructions   Call MD for:  difficulty breathing, headache or visual disturbances    Complete by:  As directed      Call MD for:  persistant nausea and vomiting    Complete by:  As directed      Call MD for:  redness, tenderness, or signs of infection (pain, swelling, redness, odor or green/yellow discharge around incision site)    Complete by:  As directed      Call MD for:  severe uncontrolled pain    Complete by:  As directed      Call MD for:  temperature >100.4    Complete by:  As directed      Diet - low sodium heart healthy    Complete by:  As directed      Discharge instructions    Complete by:  As directed   No heavy lifting, no bending or twisting, no driving     Increase activity slowly    Complete by:  As directed            Follow-up Information   Follow up with Treyvion Durkee S, MD. Schedule an appointment as soon as possible for a visit in 2 weeks.   Specialty:  Neurosurgery   Contact information:   [address] [place] [address] [phone]        Signed: Eustace Moore 01/19/2014, 9:54 AM

## 2014-01-22 ENCOUNTER — Encounter (HOSPITAL_COMMUNITY): Payer: Self-pay | Admitting: Neurological Surgery

## 2014-01-22 ENCOUNTER — Ambulatory Visit: Payer: Medicare Other | Admitting: Internal Medicine

## 2014-01-25 ENCOUNTER — Encounter (HOSPITAL_COMMUNITY): Payer: Self-pay | Admitting: Emergency Medicine

## 2014-01-25 ENCOUNTER — Emergency Department (HOSPITAL_COMMUNITY)
Admission: EM | Admit: 2014-01-25 | Discharge: 2014-01-25 | Disposition: A | Discharge status: Home / Self Care | Payer: Medicare Other | Attending: Emergency Medicine | Admitting: Emergency Medicine

## 2014-01-25 DIAGNOSIS — Z98811 Dental restoration status: Secondary | ICD-10-CM | POA: Insufficient documentation

## 2014-01-25 DIAGNOSIS — Z8669 Personal history of other diseases of the nervous system and sense organs: Secondary | ICD-10-CM | POA: Diagnosis not present

## 2014-01-25 DIAGNOSIS — Z8742 Personal history of other diseases of the female genital tract: Secondary | ICD-10-CM | POA: Diagnosis not present

## 2014-01-25 DIAGNOSIS — M545 Low back pain, unspecified: Secondary | ICD-10-CM

## 2014-01-25 DIAGNOSIS — M1990 Unspecified osteoarthritis, unspecified site: Secondary | ICD-10-CM | POA: Diagnosis not present

## 2014-01-25 DIAGNOSIS — Y838 Other surgical procedures as the cause of abnormal reaction of the patient, or of later complication, without mention of misadventure at the time of the procedure: Secondary | ICD-10-CM | POA: Diagnosis not present

## 2014-01-25 DIAGNOSIS — I10 Essential (primary) hypertension: Secondary | ICD-10-CM | POA: Diagnosis not present

## 2014-01-25 DIAGNOSIS — F17200 Nicotine dependence, unspecified, uncomplicated: Secondary | ICD-10-CM | POA: Diagnosis not present

## 2014-01-25 DIAGNOSIS — Z87442 Personal history of urinary calculi: Secondary | ICD-10-CM | POA: Insufficient documentation

## 2014-01-25 DIAGNOSIS — Z923 Personal history of irradiation: Secondary | ICD-10-CM | POA: Diagnosis not present

## 2014-01-25 DIAGNOSIS — Z8719 Personal history of other diseases of the digestive system: Secondary | ICD-10-CM | POA: Diagnosis not present

## 2014-01-25 DIAGNOSIS — Z862 Personal history of diseases of the blood and blood-forming organs and certain disorders involving the immune mechanism: Secondary | ICD-10-CM | POA: Diagnosis not present

## 2014-01-25 DIAGNOSIS — Z853 Personal history of malignant neoplasm of breast: Secondary | ICD-10-CM | POA: Insufficient documentation

## 2014-01-25 DIAGNOSIS — Z79899 Other long term (current) drug therapy: Secondary | ICD-10-CM | POA: Diagnosis not present

## 2014-01-25 DIAGNOSIS — Z9889 Other specified postprocedural states: Secondary | ICD-10-CM | POA: Insufficient documentation

## 2014-01-25 DIAGNOSIS — Z8619 Personal history of other infectious and parasitic diseases: Secondary | ICD-10-CM | POA: Diagnosis not present

## 2014-01-25 DIAGNOSIS — T819XXA Unspecified complication of procedure, initial encounter: Secondary | ICD-10-CM | POA: Diagnosis not present

## 2014-01-25 DIAGNOSIS — Z872 Personal history of diseases of the skin and subcutaneous tissue: Secondary | ICD-10-CM | POA: Diagnosis not present

## 2014-01-25 DIAGNOSIS — R6883 Chills (without fever): Secondary | ICD-10-CM | POA: Diagnosis not present

## 2014-01-25 LAB — URINALYSIS, ROUTINE W REFLEX MICROSCOPIC
Bilirubin Urine: NEGATIVE
Glucose, UA: NEGATIVE mg/dL
Hgb urine dipstick: NEGATIVE
KETONES UR: NEGATIVE mg/dL
NITRITE: NEGATIVE
PH: 7.5 (ref 5.0–8.0)
PROTEIN: NEGATIVE mg/dL
Specific Gravity, Urine: 1.012 (ref 1.005–1.030)
Urobilinogen, UA: 0.2 mg/dL (ref 0.0–1.0)

## 2014-01-25 LAB — URINE MICROSCOPIC-ADD ON

## 2014-01-25 LAB — CBC WITH DIFFERENTIAL/PLATELET
BASOS ABS: 0 10*3/uL (ref 0.0–0.1)
Basophils Relative: 0 % (ref 0–1)
EOS PCT: 8 % — AB (ref 0–5)
Eosinophils Absolute: 0.6 10*3/uL (ref 0.0–0.7)
HCT: 37.2 % (ref 36.0–46.0)
Hemoglobin: 11.8 g/dL — ABNORMAL LOW (ref 12.0–15.0)
LYMPHS PCT: 28 % (ref 12–46)
Lymphs Abs: 2 10*3/uL (ref 0.7–4.0)
MCH: 30.5 pg (ref 26.0–34.0)
MCHC: 31.7 g/dL (ref 30.0–36.0)
MCV: 96.1 fL (ref 78.0–100.0)
Monocytes Absolute: 0.8 10*3/uL (ref 0.1–1.0)
Monocytes Relative: 11 % (ref 3–12)
NEUTROS PCT: 53 % (ref 43–77)
Neutro Abs: 3.9 10*3/uL (ref 1.7–7.7)
Platelets: 449 10*3/uL — ABNORMAL HIGH (ref 150–400)
RBC: 3.87 MIL/uL (ref 3.87–5.11)
RDW: 13.4 % (ref 11.5–15.5)
WBC: 7.4 10*3/uL (ref 4.0–10.5)

## 2014-01-25 LAB — BASIC METABOLIC PANEL
ANION GAP: 12 (ref 5–15)
BUN: 19 mg/dL (ref 6–23)
CALCIUM: 9.6 mg/dL (ref 8.4–10.5)
CO2: 32 mEq/L (ref 19–32)
Chloride: 99 mEq/L (ref 96–112)
Creatinine, Ser: 1.73 mg/dL — ABNORMAL HIGH (ref 0.50–1.10)
GFR calc Af Amer: 36 mL/min — ABNORMAL LOW (ref >90)
GFR calc non Af Amer: 31 mL/min — ABNORMAL LOW (ref >90)
Glucose, Bld: 93 mg/dL (ref 70–99)
Potassium: 4.1 mEq/L (ref 3.7–5.3)
Sodium: 143 mEq/L (ref 137–147)

## 2014-01-25 MED ORDER — CYCLOBENZAPRINE HCL 10 MG PO TABS: 10.0000 mg | ORAL_TABLET | Freq: Two times a day (BID) | ORAL | Status: DC | PRN

## 2014-01-25 MED ORDER — OXYCODONE HCL ER 10 MG PO T12A: 10.0000 mg | EXTENDED_RELEASE_TABLET | Freq: Two times a day (BID) | ORAL | Status: DC

## 2014-01-25 MED ORDER — MORPHINE SULFATE 4 MG/ML IJ SOLN: 4.0000 mg | Freq: Once | INTRAMUSCULAR | Status: DC

## 2014-01-25 MED ORDER — METHOCARBAMOL 500 MG PO TABS: 500.0000 mg | ORAL_TABLET | Freq: Two times a day (BID) | ORAL | Status: DC | PRN

## 2014-01-25 MED ORDER — DIAZEPAM 5 MG/ML IJ SOLN
5.0000 mg | Freq: Once | INTRAMUSCULAR | Status: AC
  Administered 2014-01-25: 5 mg via INTRAVENOUS
  Filled 2014-01-25: qty 2

## 2014-01-25 NOTE — ED Notes (Signed)
Patient states unable to give urine sample at this time.  Will continue to monitor

## 2014-01-25 NOTE — Discharge Instructions (Signed)
Return to the emergency room with worsening of symptoms, new symptoms or with symptoms that are concerning, especially fevers, severe back pain, loss of bladder or fecal control, numbness around anus. Follow up with Dr. Ronnald Ramp your neurosurgeon.

## 2014-01-25 NOTE — ED Provider Notes (Signed)
CSN: [id number]     Arrival date & time 01/25/14  1533 History   First MD Initiated Contact with Patient 01/25/14 1832     Chief Complaint  Patient presents with  . Post-op Problem     HPI Monique Henderson is a 63 y.o. female s/p lumbar fusion 01/18/14 who presents with worsening bilateral low back pain. Patient has been taking oxycodone 10mg  six times a day since surgery without relief. She has not run out. Pain radiates into the legs bilaterally and is a burning pain. Patient also has "pins and needles" in left groin fold which is concerning for her. Has a sharp pain there as well.  She contacted Dr. Ronnald Ramp this morning who told her to come to the ED. No saddle anesthesia. Pt urination is improving. She only voids a little and then voids a little more.  She denies fecal or urinary incontinence dysuria, hematuria, frequency. Patient reports constipation and was taking miralax with relief. Dr. Sherley Bounds is her neurosurgeon. Patient never filled her muscle relaxer script because it was not covered by her insurance.   Past Medical History  Diagnosis Date  . GERD (gastroesophageal reflux disease)   . Hepatitis C   . Hypertension   . Low back pain   . Uterine fibroid   . Normocytic anemia     With thrombocytosis  . Right ureteral stone 2002  . Atelectasis 2002    Bilateral  . Bone spur 2008    Right calcaneal foot spur  . Lumbar spinal stenosis     S/P lumbar decompressive laminectomy, fusion and plating for lumbar spinal stensosis  . Hot flashes   . Wears dentures     top  . Hx of radiation therapy 2005    left breast  . 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 Autumn Messing; S/P re-excision of superior and inferior margins 10/27/2012.   . Osteoarthritis   . Shortness of breath     from pain  . Hx of radiation therapy 01/11/13- 03/22/13    right breast 5760 cGy 30 sessions  . Breast cancer 2004    Ductal carcinoma in situ of  the left breast; S/P left partial mastectomy 02/26/2003; S/P re-excision of cranial and lateral margins11/18/2004.radiation  . Breast cancer 09/21/2012    right breast/ last radiation treatment 03/22/2013   Past Surgical History  Procedure Laterality Date  . Laminectomy  05/27/2009    Lumbar decompressive laminectomy, fusion and plating for lumbar spinal stensosis  . Breast lumpectomy with needle localization and axillary sentinel lymph node bx Right 10/13/2012    Procedure: BREAST LUMPECTOMY WITH NEEDLE LOCALIZATION;  Surgeon: [name], MD;  Location: [facility];  Service: General;  Laterality: Right;  Right breast wire localized lumpectomy  . Re-excision of breast cancer,superior margins Right 10/27/2012    Procedure: RE-EXCISION OF BREAST CANCER,SUPERIOR and inferior MARGINS;  Surgeon: [name], MD;  Location: [facility];  Service: General;  Laterality: Right;  . Back surgery    . Lumbar laminectomy/decompression microdiscectomy Left 03/23/2013    Procedure: LUMBAR LAMINECTOMY/DECOMPRESSION MICRODISCECTOMY 1 LEVEL;  Surgeon: Eustace Moore, MD;  Location: [facility];  Service: Neurosurgery;  Laterality: Left;  LUMBAR LAMINECTOMY/DECOMPRESSION MICRODISCECTOMY 1 LEVEL  . Anterior lumbar fusion  01/18/2014  . Mastectomy, partial Left 02/26/2003    ; S/P re-excision of cranial and lateral margins 04/19/2003.   [facility]land Kitchen Re-excision of breast  lumpectomy Left 04/2003  . Breast lumpectomy Left 01/2003  . Breast lumpectomy Right 2014  . Anterior lat lumbar fusion N/A 01/18/2014    Procedure: ANTERIOR LATERAL LUMBAR FUSION LUMBAR TWO-THREE;  Surgeon: Eustace Moore, MD;  Location: [facility];  Service: Neurosurgery;  Laterality: N/A;   Family History  Problem Relation Age of Onset  . Colon cancer Mother 85  . Hypertension Mother   . Diabetes Sister 54  . Hypertension Sister   . Breast cancer Neg Hx   . Cervical cancer Neg Hx   . Diabetes Brother   . Hypertension Brother   .  Diabetes Brother   . Hypertension Brother   . Kidney disease Son     On dialysis  . Hypertension Son   . Diabetes Son   . Multiple sclerosis Son   . Bone cancer Sister 35   History  Substance Use Topics  . Smoking status: Current Some Day Smoker -- 0.25 packs/day for 44 years    Types: Cigarettes  . Smokeless tobacco: Never Used  . Alcohol Use: 1.2 oz/week    2 Cans of beer per week     Comment:  "couple beers q weekend"   OB History   Grav Para Term Preterm Abortions TAB SAB Ect Mult Living   2 2 1 1            Review of Systems  Constitutional: Positive for chills. Negative for fever.  HENT: Negative for congestion and rhinorrhea.   Eyes: Negative for visual disturbance.  Respiratory: Negative for cough and shortness of breath.   Cardiovascular: Negative for chest pain and palpitations.  Gastrointestinal: Negative for nausea, vomiting and diarrhea.  Genitourinary: Negative for dysuria, frequency, hematuria and vaginal discharge.  Musculoskeletal: Positive for back pain and myalgias. Negative for gait problem and joint swelling.  Skin: Negative for rash.  Neurological: Negative for weakness and headaches.      Allergies  Shrimp; Bactroban; Vicodin; Lisinopril; and Tylenol  Home Medications   Prior to Admission medications   Medication Sig Start Date End Date Taking? Authorizing Provider  amLODipine (NORVASC) 10 MG tablet Take 1 tablet (10 mg total) by mouth daily. 12/06/13  Yes Axel Filler, MD  B Complex Vitamins (VITAMIN B COMPLEX PO) Take 1 tablet by mouth daily.    Yes Historical Provider, MD  cetirizine (ZYRTEC) 10 MG tablet Take 10 mg by mouth daily.   Yes Historical Provider, MD  cloNIDine (CATAPRES) 0.1 MG tablet Take 1 tablet (0.1 mg total) by mouth 2 (two) times daily. 12/06/13  Yes Axel Filler, MD  losartan (COZAAR) 25 MG tablet Take 1 tablet (25 mg total) by mouth daily. 12/06/13  Yes Axel Filler, MD  oxyCODONE 10 MG TABS Take 1 tablet (10 mg  total) by mouth every 6 (six) hours as needed for moderate pain. 01/19/14  Yes Eustace Moore, MD  traMADol (ULTRAM) 50 MG tablet Take 50 mg by mouth at bedtime.   Yes Historical Provider, MD  zolpidem (AMBIEN) 5 MG tablet Take 1 tablet (5 mg total) by mouth at bedtime as needed for sleep. 10/10/13  Yes Axel Filler, MD  cyclobenzaprine (FLEXERIL) 10 MG tablet Take 1 tablet (10 mg total) by mouth 2 (two) times daily as needed for muscle spasms. 01/25/14   Pura Spice, PA-C   BP 164/97  Pulse 77  Temp(Src) 98.7 F (37.1 C) (Oral)  Resp 20  Ht 5\' 3"  (1.6 m)  Wt 133 lb (  60.328 kg)  BMI 23.57 kg/m2  SpO2 100% Physical Exam  Nursing note and vitals reviewed. Constitutional: She is oriented to person, place, and time. She appears well-developed and well-nourished. No distress.  HENT:  Head: Normocephalic and atraumatic.  Eyes: Conjunctivae and EOM are normal. Right eye exhibits no discharge. Left eye exhibits no discharge. No scleral icterus.  Cardiovascular: Normal rate, regular rhythm and normal heart sounds.   Pulmonary/Chest: Effort normal and breath sounds normal. No respiratory distress. She has no wheezes.  Abdominal: Soft. Bowel sounds are normal. She exhibits no distension. There is no tenderness.  Musculoskeletal:  Bilateral lower back tenderness. No midline tenderness. Negative straight leg, bilaterally.  No erythema, masses or lesions in right or left inguinal fold.  Neurological: She is alert and oriented to person, place, and time. She has normal reflexes. She exhibits normal muscle tone. Coordination normal.  Strength 5/5 in upper and lower extremities. Sensation intact in lower extremities bilaterally.  Antalgic gait.  Skin: Skin is warm and dry. She is not diaphoretic.  Two left sided 6 cm linear incisions without warmth, erythema, edema, discharge, healing well. Appropriate tenderness.  Psychiatric: She has a normal mood and affect. Her behavior is normal.    ED  Course  Procedures (including critical care time) Labs Review Labs Reviewed  CBC WITH DIFFERENTIAL - Abnormal; Notable for the following:    Hemoglobin 11.8 (*)    Platelets 449 (*)    Eosinophils Relative 8 (*)    All other components within normal limits  BASIC METABOLIC PANEL - Abnormal; Notable for the following:    Creatinine, Ser 1.73 (*)    GFR calc non Af Amer 31 (*)    GFR calc Af Amer 36 (*)    All other components within normal limits  URINALYSIS, ROUTINE W REFLEX MICROSCOPIC - Abnormal; Notable for the following:    Leukocytes, UA TRACE (*)    All other components within normal limits  URINE MICROSCOPIC-ADD ON - Abnormal; Notable for the following:    Squamous Epithelial / LPF FEW (*)    Casts HYALINE CASTS (*)    All other components within normal limits    Imaging Review No results found.   EKG Interpretation None     Meds given in ED:  Medications  diazepam (VALIUM) injection 5 mg (5 mg Intravenous Given 01/25/14 1947)    Discharge Medication List as of 01/25/2014  9:28 PM    START taking these medications   Details  cyclobenzaprine (FLEXERIL) 10 MG tablet Take 1 tablet (10 mg total) by mouth 2 (two) times daily as needed for muscle spasms., Starting 01/25/2014, Until Discontinued, Print          MDM   Final diagnoses:  Status post lumbar spine operation  Bilateral low back pain without sciatica   Monique Henderson is a 63 y.o. female s/p lumbar fusion 01/18/14 presenting with worsening bilateral lower back pain that radiates into her legs. Tingling and sharp pain to left inguinal fold. Taking full dose of oxycodone without relief. No saddle anesthesia or urinary or fecal incontinence. VSS. Patient with equal intact strength bilaterally in LE. Good pedal pulses. DTR equal bilaterally. Bilateral lower back tenderness to palpation. UA without signs of infection. Patient given valium in ED with improvement of symptoms.  Patient discussed with Dr. Delice Lesch,  neurosurgery who agrees with no imaging at this time and close follow up with Dr. Ronnald Ramp. Appointment scheduled for 02/07/14. Patient given script for flexeril which is  on the [facility] 4 dollar list.   Discussed return precautions with patient. Discussed all results and patient verbalizes understanding and agrees with plan.  This is a shared patient. This patient was discussed with the physician, Dr. Evelina Bucy who saw and evaluated the patient.      Pura Spice, PA-C 01/26/14 1140

## 2014-01-25 NOTE — ED Provider Notes (Signed)
Monique Henderson is a 63 y.o. female status post lumbar fusion 7 days ago presenting with increasing discomfort and pain in the low back that radiates to both legs. Patient states that she has a pins and needles paresthesia in the left groin. Physical exam with no focal findings. Patient afebrile, surgical wound does not appear to be infected. Patient reports significant subjective improvement after value and morphine.  Case discussed with neurosurgery Dr. Vertell Limber, agrees with discharge to home with additional Valium. Agrees with no imaging at this time. Encourage close followup with Dr. Ronnald Ramp.   Monico Blitz, PA-C 01/26/14 [phone]

## 2014-01-25 NOTE — ED Notes (Signed)
Presents post lumbar fusion on Thursday-since has been having pain in bilateral legs and groin as well as lower back. Pain is described as aching and burning. Taking 10mg  oxycodone with no relief. Incision site CDI-no redness or edema. Dr. Ronnald Ramp is surgeon

## 2014-01-26 NOTE — ED Provider Notes (Signed)
Medical screening examination/treatment/procedure(s) were conducted as a shared visit with non-physician practitioner(s) and myself.  I personally evaluated the patient during the encounter.   EKG Interpretation None      1 week post-op from spinal fusion. Incision well-appearing, sent to the ED by Neurosurgery. Feeling much better after meds, no concerning findings on exam. Stable for discharge.  Evelina Bucy, MD 01/26/14 [phone]

## 2014-01-26 NOTE — ED Provider Notes (Signed)
Medical screening examination/treatment/procedure(s) were conducted as a shared visit with non-physician practitioner(s) and myself.  I personally evaluated the patient during the encounter.   EKG Interpretation None      1 week post-op from spinal fusion. Incision well-appearing, sent to the ED by Neurosurgery. Feeling much better after meds, no concerning findings on exam. Stable for discharge.  Evelina Bucy, MD 01/26/14 [phone]

## 2014-01-28 ENCOUNTER — Other Ambulatory Visit: Payer: Self-pay | Admitting: Internal Medicine

## 2014-01-29 NOTE — Telephone Encounter (Signed)
She was advised to stop taking this medication at discharge, so will not refill.  She can call and discuss with Dr. Marinda Elk when he is back.  Per d/c summary, she was given narcotics for post surgical pain and she should take that medication for pain instead.

## 2014-02-14 ENCOUNTER — Encounter: Payer: Self-pay | Admitting: Internal Medicine

## 2014-02-14 ENCOUNTER — Ambulatory Visit (INDEPENDENT_AMBULATORY_CARE_PROVIDER_SITE_OTHER): Payer: Medicare Other | Admitting: Internal Medicine

## 2014-02-14 VITALS — BP 171/105 | HR 77 | Temp 98.0°F | Ht 63.0 in | Wt 137.2 lb

## 2014-02-14 DIAGNOSIS — N259 Disorder resulting from impaired renal tubular function, unspecified: Secondary | ICD-10-CM | POA: Diagnosis not present

## 2014-02-14 DIAGNOSIS — R21 Rash and other nonspecific skin eruption: Secondary | ICD-10-CM | POA: Diagnosis not present

## 2014-02-14 DIAGNOSIS — I10 Essential (primary) hypertension: Secondary | ICD-10-CM | POA: Diagnosis not present

## 2014-02-14 DIAGNOSIS — N289 Disorder of kidney and ureter, unspecified: Secondary | ICD-10-CM | POA: Diagnosis not present

## 2014-02-14 DIAGNOSIS — Z981 Arthrodesis status: Secondary | ICD-10-CM | POA: Diagnosis not present

## 2014-02-14 LAB — COMPLETE METABOLIC PANEL WITH GFR
ALBUMIN: 4.1 g/dL (ref 3.5–5.2)
ALT: 43 U/L — ABNORMAL HIGH (ref 0–35)
AST: 35 U/L (ref 0–37)
Alkaline Phosphatase: 95 U/L (ref 39–117)
BUN: 24 mg/dL — AB (ref 6–23)
CALCIUM: 9.7 mg/dL (ref 8.4–10.5)
CHLORIDE: 102 meq/L (ref 96–112)
CO2: 29 mEq/L (ref 19–32)
Creat: 1.24 mg/dL — ABNORMAL HIGH (ref 0.50–1.10)
GFR, EST NON AFRICAN AMERICAN: 47 mL/min — AB
GFR, Est African American: 54 mL/min — ABNORMAL LOW
Glucose, Bld: 94 mg/dL (ref 70–99)
POTASSIUM: 3.9 meq/L (ref 3.5–5.3)
Sodium: 143 mEq/L (ref 135–145)
Total Bilirubin: 0.3 mg/dL (ref 0.2–1.2)
Total Protein: 7.8 g/dL (ref 6.0–8.3)

## 2014-02-14 LAB — URINALYSIS, ROUTINE W REFLEX MICROSCOPIC
Bilirubin Urine: NEGATIVE
Glucose, UA: NEGATIVE mg/dL
Hgb urine dipstick: NEGATIVE
Ketones, ur: NEGATIVE mg/dL
LEUKOCYTES UA: NEGATIVE
NITRITE: NEGATIVE
PROTEIN: 30 mg/dL — AB
Specific Gravity, Urine: 1.019 (ref 1.005–1.030)
Urobilinogen, UA: 1 mg/dL (ref 0.0–1.0)
pH: 6.5 (ref 5.0–8.0)

## 2014-02-14 LAB — URINALYSIS, MICROSCOPIC ONLY

## 2014-02-14 MED ORDER — CETIRIZINE HCL 10 MG PO TABS: 10.0000 mg | ORAL_TABLET | Freq: Every day | ORAL | Status: DC

## 2014-02-14 MED ORDER — TRIAMCINOLONE ACETONIDE 0.1 % EX CREA: 1.0000 "application " | TOPICAL_CREAM | Freq: Two times a day (BID) | CUTANEOUS | Status: DC

## 2014-02-14 NOTE — Assessment & Plan Note (Signed)
BP Readings from Last 3 Encounters:  02/14/14 171/105  01/25/14 164/97  01/19/14 150/86    Lab Results  Component Value Date   NA 143 01/25/2014   K 4.1 01/25/2014   CREATININE 1.73* 01/25/2014    Assessment: Blood pressure control:  Not controlled Progress toward BP goal:   Not at goal Comments: She is on amlodipine 10mg  daily, clonidine 0.1mg  BID, and Losartan 25mg  daily but has not taken any of these medications today.   Plan: Medications:  continue current medications Educational resources provided: brochure;handout;video Self management tools provided:   Other plans: Follow up in 1 month

## 2014-02-14 NOTE — Assessment & Plan Note (Addendum)
Last Cr of 1.7 on 8/27, baseline Cr appears to be ~1.2-1.5. Losartan had been held but now that she is back on it will recheck Creatinine.  -CMP today with Cr of 1.2, at her baseline -Urinalysis with proteinuria of 30 but she is already on ARB tx

## 2014-02-14 NOTE — Assessment & Plan Note (Signed)
She has papular rash localized to bilateral arms over extensor muscles, dorsal hands, and dorsal feet. She is concerned about bed bugs though no evidence of such infestation has been found in her friend's house. Perhaps she was exposed to bed bugs somewhere else.  -Rx triamcinolone cream -Zyrtec 10mg  daily

## 2014-02-14 NOTE — Progress Notes (Signed)
   Subjective:    Patient ID: Monique Henderson, female    DOB: [birth year], 63 y.o.   MRN: [id number]  HPI Ms. Crapser is a 63 year old woman with PMH of HTN, CKD, chronic back pain s/p spinal fusion on 01/18/14, presenting for evaluation of pruritic papular rash that started 1.5 weeks ago. She states that she did well after the surgery but started having severe itching after sleeping at a friends house 1.5weeks ago. She is concerned that bedbugs could have started the rash but tells me that the house was inspected by an exterminator and no bedbugs were found. She continues to have the papular rash on her bilateral arms, dorsal hands, and dorsal feet.  Her back pain is pretty much gone. She has paresthesias of the left inner thigh but is able to ambulate with no difficulty with no bowel/bladder changes. The incision for the surgery is healing well.    Review of Systems  Constitutional: Negative for fever, chills, diaphoresis, activity change, appetite change, fatigue and unexpected weight change.  Respiratory: Negative for cough, shortness of breath and wheezing.   Cardiovascular: Negative for chest pain, palpitations and leg swelling.  Gastrointestinal: Negative for nausea, vomiting, abdominal pain and constipation.  Genitourinary: Negative for dysuria and difficulty urinating.  Skin: Positive for rash.  Neurological: Negative for dizziness, weakness and light-headedness.  Psychiatric/Behavioral: Negative for agitation.       Objective:   Physical Exam  Nursing note and vitals reviewed. Constitutional: She is oriented to person, place, and time. She appears well-developed and well-nourished.  Mild distress due to itching  HENT:  Head: Atraumatic.  Cardiovascular: Normal rate and regular rhythm.   Pulmonary/Chest: Effort normal and breath sounds normal. No respiratory distress. She has no wheezes. She has no rales.  Abdominal: There is no tenderness.  Musculoskeletal: She exhibits no  edema and no tenderness.  Left lower hip/flank incision scar healing well with no erythema or edema. Normal hip flexion/extension.    Neurological: She is alert and oriented to person, place, and time. Coordination normal.  Skin: Skin is warm and dry. Rash noted. She is not diaphoretic.  Papular rash on bilateral arms over extensor muscles. Bilateral dorsal hands, and bilateral dorsal feet.   Psychiatric: She has a normal mood and affect.          Assessment & Plan:

## 2014-02-14 NOTE — Patient Instructions (Signed)
-  You blood pressure is elevated today to 171/105.  -Start taking you blood pressure medications everyday.  -Start taking Zyrtec 10mg  daily for the itching.  -Start using the triamcinolone cream for the rash.  -Follow up in 1 month for blood pressure recheck or sooner if the rash does not improve.   Please bring your medicines with you each time you come.   Medicines may be  Eye drops  Herbal   Vitamins  Pills  Seeing these help [place] take care of you.

## 2014-02-14 NOTE — Assessment & Plan Note (Signed)
Doing well with back pain essentially gone. Surgical scar healing well.

## 2014-02-15 NOTE — Progress Notes (Signed)
Medicine attending: Medical history, presenting problems, physical findings, and medications, reviewed with medical resident Dr. Hayes Ludwig and I concur with her evaluation and management plan. The patient believes she was exposed to lice. She has a rash which is not typical of a lice infestation however we will go ahead and prescribe empiric therapy for this. She will call if there's no improvement in the next few days. Murriel Hopper M.D. FACP

## 2014-03-16 ENCOUNTER — Other Ambulatory Visit: Payer: Self-pay | Admitting: Internal Medicine

## 2014-03-16 NOTE — Telephone Encounter (Signed)
Rx called in 

## 2014-03-16 NOTE — Telephone Encounter (Signed)
Has appt next month PCP

## 2014-03-20 DIAGNOSIS — Z48811 Encounter for surgical aftercare following surgery on the nervous system: Secondary | ICD-10-CM | POA: Diagnosis not present

## 2014-04-02 ENCOUNTER — Encounter: Payer: Self-pay | Admitting: Internal Medicine

## 2014-04-11 ENCOUNTER — Ambulatory Visit (INDEPENDENT_AMBULATORY_CARE_PROVIDER_SITE_OTHER): Payer: Medicare Other | Admitting: Internal Medicine

## 2014-04-11 ENCOUNTER — Encounter: Payer: Self-pay | Admitting: Internal Medicine

## 2014-04-11 VITALS — BP 158/107 | HR 72 | Temp 98.0°F | Ht 63.0 in | Wt 137.5 lb

## 2014-04-11 DIAGNOSIS — I10 Essential (primary) hypertension: Secondary | ICD-10-CM

## 2014-04-11 DIAGNOSIS — Z124 Encounter for screening for malignant neoplasm of cervix: Secondary | ICD-10-CM

## 2014-04-11 DIAGNOSIS — N189 Chronic kidney disease, unspecified: Secondary | ICD-10-CM | POA: Diagnosis not present

## 2014-04-11 DIAGNOSIS — Z0000 Encounter for general adult medical examination without abnormal findings: Secondary | ICD-10-CM

## 2014-04-11 DIAGNOSIS — I129 Hypertensive chronic kidney disease with stage 1 through stage 4 chronic kidney disease, or unspecified chronic kidney disease: Secondary | ICD-10-CM

## 2014-04-11 DIAGNOSIS — Z981 Arthrodesis status: Secondary | ICD-10-CM

## 2014-04-11 DIAGNOSIS — F17200 Nicotine dependence, unspecified, uncomplicated: Secondary | ICD-10-CM

## 2014-04-11 DIAGNOSIS — Z720 Tobacco use: Secondary | ICD-10-CM

## 2014-04-11 DIAGNOSIS — M545 Low back pain: Secondary | ICD-10-CM

## 2014-04-11 MED ORDER — TRAMADOL HCL 50 MG PO TABS: 50.0000 mg | ORAL_TABLET | Freq: Four times a day (QID) | ORAL | Status: DC | PRN

## 2014-04-11 MED ORDER — LOSARTAN POTASSIUM 25 MG PO TABS: 50.0000 mg | ORAL_TABLET | Freq: Every day | ORAL | Status: DC

## 2014-04-11 NOTE — Patient Instructions (Addendum)
Increase losartan (Cozaar) a 25 mg to a dose of 2 tablets once daily. Please follow up with Dr. Ronnald Ramp as scheduled.

## 2014-04-11 NOTE — Assessment & Plan Note (Signed)
Patient refused all immunizations.  I advised her of the need for Pap smear, and placed a gynecology referral for pelvic and Pap smear.

## 2014-04-11 NOTE — Assessment & Plan Note (Signed)
BP Readings from Last 3 Encounters:  04/11/14 158/107  02/14/14 171/105  01/25/14 164/97    Lab Results  Component Value Date   NA 143 02/14/2014   K 3.9 02/14/2014   CREATININE 1.24* 02/14/2014    Assessment: Blood pressure control: moderately elevated Progress toward BP goal:  unchanged Comments: Blood pressure is moderately elevated on amlodipine 10 mg daily, clonidine 0.1 mg twice a day, and losartan 25 mg daily.  Patient reports that she is compliant with her medications.  Plan: Medications:  Increase losartan to 50 mg daily; continue amlodipine 10 mg daily and clonidine 0.1 mg twice a day

## 2014-04-11 NOTE — Assessment & Plan Note (Signed)
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 Eustace Moore.  She does have some residual pain for which she has been taking oxycodone and acetaminophen.  Plan: I advised patient to transition from the oxycodone-acetaminophen 5-325 mg tablets back to tramadol 50 mg every 6 hours as needed for pain.  She will follow up in January with Dr. Ronnald Ramp as scheduled.  I offered pain clinic referral today since she has some persisting pain, but she prefers to wait until she sees Dr. Ronnald Ramp in January to decide.

## 2014-04-11 NOTE — Assessment & Plan Note (Signed)
  Assessment: Progress toward smoking cessation:  smoking the same amount Barriers to progress toward smoking cessation:  lack of motivation to quit Comments: patient reports smoking about 5 cigarettes per day; she feels that she can quit on her own without any medication assistance.  Plan: Instruction/counseling given:  I counseled patient on the dangers of tobacco use, advised patient to stop smoking, and reviewed strategies to maximize success. Medications to assist with smoking cessation:  None Patient agreed to the following self-care plans for smoking cessation: set a quit date and stop smoking

## 2014-04-11 NOTE — Progress Notes (Signed)
   Subjective:    Patient ID: Monique Henderson, female    DOB: [birth year], 63 y.o.   MRN: [id number]  HPI Patient returns for management of her hypertension, chronic low back pain, and other medical problems.  She underwent lumbar fusion by Dr. Sherley Bounds on 01/18/2014 and reports improvement in her back pain following the procedure.  She still reports some ongoing back pain, for which she has most recently been taking oxycodone with acetaminophen 5/325 mg every 6 hours as needed for pain.  She has been out of tramadol.  She reports that she is compliant with her antihypertensive medications.  She did not bring her medication bottles to clinic today for review.   Review of Systems  Constitutional: Negative for fever, chills and diaphoresis.  Respiratory: Negative for cough, shortness of breath and wheezing.   Cardiovascular: Negative for chest pain and leg swelling.  Gastrointestinal: Negative for nausea, vomiting, abdominal pain, blood in stool and anal bleeding.  Genitourinary: Negative for dysuria and difficulty urinating.  Musculoskeletal: Positive for back pain.  Neurological: Negative for seizures and weakness.   I reviewed and updated the medication list, allergies, past medical history, past surgical history, family history, and social history.      Objective:   Physical Exam  Constitutional: No distress.  Cardiovascular: Normal rate, regular rhythm and normal heart sounds.  Exam reveals no gallop and no friction rub.   No murmur heard. Pulmonary/Chest: Effort normal and breath sounds normal. No respiratory distress. She has no wheezes. She has no rales. She exhibits no tenderness.  Abdominal: Soft. Bowel sounds are normal. She exhibits no distension. There is no hepatosplenomegaly. There is no tenderness. There is no rebound and no guarding.  Musculoskeletal: She exhibits no edema.        Assessment & Plan:

## 2014-04-11 NOTE — Assessment & Plan Note (Signed)
Lab Results  Component Value Date   CREATININE 1.24* 02/14/2014   CREATININE 1.73* 01/25/2014   CREATININE 1.40* 01/08/2014   CREATININE 1.38* 12/05/2013   CREATININE 1.52* 08/28/2013   CREATININE 1.53* 03/21/2013     Assessment:  Patient's creatinine was stable when last checked in September.  Plan:  Check a metabolic panel today.

## 2014-05-01 ENCOUNTER — Encounter: Payer: Self-pay | Admitting: Obstetrics & Gynecology

## 2014-05-10 DIAGNOSIS — D0511 Intraductal carcinoma in situ of right breast: Secondary | ICD-10-CM | POA: Diagnosis not present

## 2014-06-03 ENCOUNTER — Other Ambulatory Visit: Payer: Self-pay | Admitting: Internal Medicine

## 2014-06-18 ENCOUNTER — Encounter: Payer: Medicare Other | Admitting: Obstetrics & Gynecology

## 2014-06-25 DIAGNOSIS — M5126 Other intervertebral disc displacement, lumbar region: Secondary | ICD-10-CM | POA: Diagnosis not present

## 2014-06-25 DIAGNOSIS — Z48811 Encounter for surgical aftercare following surgery on the nervous system: Secondary | ICD-10-CM | POA: Diagnosis not present

## 2014-06-25 DIAGNOSIS — Z6827 Body mass index (BMI) 27.0-27.9, adult: Secondary | ICD-10-CM | POA: Diagnosis not present

## 2014-06-25 DIAGNOSIS — I10 Essential (primary) hypertension: Secondary | ICD-10-CM | POA: Diagnosis not present

## 2014-08-07 ENCOUNTER — Other Ambulatory Visit: Payer: Self-pay | Admitting: Internal Medicine

## 2014-08-07 NOTE — Telephone Encounter (Signed)
Please schedule an appointment within 1 month.

## 2014-08-15 ENCOUNTER — Encounter: Payer: Self-pay | Admitting: *Deleted

## 2014-08-15 ENCOUNTER — Other Ambulatory Visit: Payer: Self-pay | Admitting: *Deleted

## 2014-08-15 MED ORDER — ALBUTEROL SULFATE HFA 108 (90 BASE) MCG/ACT IN AERS: 2.0000 | INHALATION_SPRAY | Freq: Four times a day (QID) | RESPIRATORY_TRACT | Status: DC | PRN

## 2014-08-15 NOTE — Telephone Encounter (Signed)
Need to clarify the indication for albuterol inhaler at patient's next visit.  I do not see documentation of pulmonary workup or a definitive prior diagnosis of asthma or COPD.

## 2014-09-07 DIAGNOSIS — Z981 Arthrodesis status: Secondary | ICD-10-CM | POA: Diagnosis not present

## 2014-09-07 DIAGNOSIS — M5126 Other intervertebral disc displacement, lumbar region: Secondary | ICD-10-CM | POA: Diagnosis not present

## 2014-09-20 ENCOUNTER — Encounter: Payer: Medicare Other | Admitting: Obstetrics & Gynecology

## 2014-09-25 ENCOUNTER — Other Ambulatory Visit: Payer: Self-pay | Admitting: Internal Medicine

## 2014-09-25 NOTE — Telephone Encounter (Addendum)
Refill approved, but to be filled no sooner than 10/05/14 - nurse to call in.  Please schedule a follow-up appointment in [facility] within 1 month.

## 2014-09-26 NOTE — Telephone Encounter (Signed)
Please find out when this medication was last filled and the quantity, and whether patient is still taking the medication.

## 2014-09-26 NOTE — Telephone Encounter (Signed)
Rx phoned into pharamacy will a fill no sooner than 10/05/2014 date.  Message sent to front office to have pt return to clinic in one mth for f/u.Despina Hidden Cassady4/27/20164:17 PM

## 2014-09-27 NOTE — Telephone Encounter (Signed)
I refilled a 1 month supply.  Please schedule an appointment in [facility] within 1 month.

## 2014-09-27 NOTE — Telephone Encounter (Signed)
Message sent to front desk pool regarding an appt per Dr Marinda Elk.

## 2014-10-10 NOTE — Addendum Note (Signed)
Addended by: Orson Gear on: 10/10/2014 01:51 PM   Modules accepted: Orders

## 2014-10-24 ENCOUNTER — Encounter: Payer: Medicare Other | Admitting: Obstetrics & Gynecology

## 2014-10-25 ENCOUNTER — Encounter: Payer: Self-pay | Admitting: Internal Medicine

## 2014-10-25 ENCOUNTER — Ambulatory Visit (INDEPENDENT_AMBULATORY_CARE_PROVIDER_SITE_OTHER): Payer: Medicare Other | Admitting: Internal Medicine

## 2014-10-25 VITALS — BP 168/93 | HR 72 | Temp 98.4°F | Ht 63.0 in | Wt 142.5 lb

## 2014-10-25 DIAGNOSIS — F17210 Nicotine dependence, cigarettes, uncomplicated: Secondary | ICD-10-CM

## 2014-10-25 DIAGNOSIS — Z981 Arthrodesis status: Secondary | ICD-10-CM | POA: Diagnosis not present

## 2014-10-25 DIAGNOSIS — I10 Essential (primary) hypertension: Secondary | ICD-10-CM

## 2014-10-25 MED ORDER — GABAPENTIN 300 MG PO CAPS: 300.0000 mg | ORAL_CAPSULE | Freq: Three times a day (TID) | ORAL | Status: DC

## 2014-10-25 MED ORDER — VITAMIN D (ERGOCALCIFEROL) 1.25 MG (50000 UNIT) PO CAPS: 50000.0000 [IU] | ORAL_CAPSULE | ORAL | Status: DC

## 2014-10-25 NOTE — Patient Instructions (Signed)
General Instructions:   Please try to bring all your medicines next time. This will help [place] keep you safe from mistakes.      Progress Toward Treatment Goals:  Treatment Goal 04/11/2014  Blood pressure unchanged  Stop smoking smoking the same amount    Self Care Goals & Plans:  Self Care Goal 10/25/2014  Manage my medications take my medicines as prescribed; bring my medications to every visit; refill my medications on time  Monitor my health -  Eat healthy foods drink diet soda or water instead of juice or soda; eat more vegetables; eat foods that are low in salt; eat baked foods instead of fried foods; eat fruit for snacks and desserts  Be physically active -  Stop smoking -    No flowsheet data found.   Care Management & Community Referrals:  Referral 04/11/2014  Referrals made for care management support -  Referrals made to community resources none

## 2014-10-25 NOTE — Progress Notes (Signed)
Case discussed with Dr. Sadek soon after the resident saw the patient.  We reviewed the resident's history and exam and pertinent patient test results.  I agree with the assessment, diagnosis, and plan of care documented in the resident's note. 
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
## 2014-10-25 NOTE — Assessment & Plan Note (Signed)
This is a chronic issue and pt has followed up with neurosurgery. Pt has decided to continue with conservative management instead of repeat surgery.  -refill of gabapentin provided today in clinic

## 2014-10-25 NOTE — Progress Notes (Signed)
Subjective:   Patient ID: Monique Henderson female   DOB: [birth year] 64 y.o.   MRN: [id number]  HPI: [name] is a 64 y.o. woman pmh as listed below presents for medication refills and her HTN.   Hypertension ROS: taking medications as instructed, no medication side effects noted, no TIA's, no chest pain on exertion, no dyspnea on exertion and no swelling of ankles.  New concerns: Pt states she needed a refill on her gabapentin for her chronic back pain as it was helping with her symptoms. She sees Dr. Ronnald Henderson of neurosurgery and stated that she would continue with conservative management.    Past Medical History  Diagnosis Date  . GERD (gastroesophageal reflux disease)   . Hepatitis C   . Hypertension   . Low back pain   . Uterine fibroid   . Normocytic anemia     With thrombocytosis  . Right ureteral stone 2002  . Atelectasis 2002    Bilateral  . Bone spur 2008    Right calcaneal foot spur  . Lumbar spinal stenosis     S/P lumbar decompressive laminectomy, fusion and plating for lumbar spinal stensosis  . Hot flashes   . Wears dentures     top  . Hx of radiation therapy 2005    left breast  . 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 Autumn Henderson; S/P re-excision of superior and inferior margins 10/27/2012.   . Osteoarthritis   . Shortness of breath     from pain  . Hx of radiation therapy 01/11/13- 03/22/13    right breast 5760 cGy 30 sessions  . Breast cancer 2004    Ductal carcinoma in situ of the left breast; S/P left partial mastectomy 02/26/2003; S/P re-excision of cranial and lateral margins11/18/2004.radiation  . Breast cancer 09/21/2012    right breast/ last radiation treatment 03/22/2013   Current Outpatient Prescriptions  Medication Sig Dispense Refill  . albuterol (PROAIR HFA) 108 (90 BASE) MCG/ACT inhaler Inhale 2 puffs into the lungs every 6 (six) hours as needed for wheezing or shortness of  breath. 18 g 1  . amLODipine (NORVASC) 10 MG tablet Take 1 tablet (10 mg total) by mouth daily. 90 tablet 3  . B Complex Vitamins (VITAMIN B COMPLEX PO) Take 1 tablet by mouth daily.     . cetirizine (ZYRTEC) 10 MG tablet TAKE ONE TABLET BY MOUTH ONCE DAILY 30 tablet 3  . cloNIDine (CATAPRES) 0.1 MG tablet Take 1 tablet (0.1 mg total) by mouth 2 (two) times daily. 60 tablet 6  . famotidine (PEPCID) 20 MG tablet Take 1 tablet (20 mg total) by mouth at bedtime. 30 tablet 3  . gabapentin (NEURONTIN) 300 MG capsule Take 1 capsule (300 mg total) by mouth 3 (three) times daily. 90 capsule 0  . losartan (COZAAR) 25 MG tablet Take 2 tablets (50 mg total) by mouth daily. 60 tablet 3  . oxyCODONE-acetaminophen (PERCOCET/ROXICET) 5-325 MG per tablet Take 1 tablet by mouth every 6 (six) hours as needed for moderate pain.     . traMADol (ULTRAM) 50 MG tablet Take 1 tablet (50 mg total) by mouth every 6 (six) hours as needed for moderate pain. 120 tablet 2  . triamcinolone cream (KENALOG) 0.1 % Apply 1 application topically 2 (two) times daily. To affected area 30 g 1  . Vitamin D, Ergocalciferol, (DRISDOL) 50000 UNITS CAPS capsule Take 1 capsule (50,000 Units total)  by mouth every 7 (seven) days. 6 capsule 1  . zolpidem (AMBIEN) 5 MG tablet Take 1 tablet (5 mg total) by mouth at bedtime as needed for sleep. 30 tablet 0  . [DISCONTINUED] lisinopril (PRINIVIL,ZESTRIL) 10 MG tablet Take 1 tablet (10 mg total) by mouth daily. 30 tablet 6   No current facility-administered medications for this visit.   Family History  Problem Relation Age of Onset  . Colon cancer Mother 47  . Hypertension Mother   . Diabetes Sister 50  . Hypertension Sister   . Breast cancer Neg Hx   . Cervical cancer Neg Hx   . Diabetes Brother   . Hypertension Brother   . Diabetes Brother   . Hypertension Brother   . Kidney disease Son     On dialysis  . Hypertension Son   . Diabetes Son   . Multiple sclerosis Son   . Bone cancer  Sister 48   History   Social History  . Marital Status: Single    Spouse Name: N/A  . Number of Children: N/A  . Years of Education: N/A   Social History Main Topics  . Smoking status: Current Some Day Smoker -- 0.25 packs/day for 44 years    Types: Cigarettes  . Smokeless tobacco: Never Used     Comment: smoking less  . Alcohol Use: 1.2 oz/week    2 Cans of beer per week     Comment:  "couple beers q weekend"  . Drug Use: Yes    Special: Cocaine     Comment: "stopped back in the 1980's"  . Sexual Activity: Yes    Birth Control/ Protection: Post-menopausal   Other Topics Concern  . None   Social History Narrative   Review of Systems: Pertinent items are noted in HPI. Objective:  Physical Exam: Filed Vitals:   10/25/14 0930  BP: 168/93  Pulse: 72  Temp: 98.4 F (36.9 C)  TempSrc: Oral  Height: 5\' 3"  (1.6 m)  Weight: 142 lb 8 oz (64.638 kg)  SpO2: 100%   General: sitting in chair, NAD Cardiac: RRR, no rubs, murmurs or gallops Pulm: clear to auscultation bilaterally, moving normal volumes of air Abd: soft, nontender, nondistended, BS present Ext: warm and well perfused, no pedal edema  Assessment & Plan:  Please see problem oriented charting  Pt discussed with Dr. Eppie Gibson

## 2014-11-05 ENCOUNTER — Other Ambulatory Visit: Payer: Self-pay | Admitting: General Surgery

## 2014-11-05 DIAGNOSIS — D0511 Intraductal carcinoma in situ of right breast: Secondary | ICD-10-CM | POA: Diagnosis not present

## 2014-11-05 DIAGNOSIS — Z1231 Encounter for screening mammogram for malignant neoplasm of breast: Secondary | ICD-10-CM

## 2014-11-05 DIAGNOSIS — Z853 Personal history of malignant neoplasm of breast: Secondary | ICD-10-CM

## 2014-11-06 ENCOUNTER — Other Ambulatory Visit: Payer: Self-pay | Admitting: Internal Medicine

## 2014-11-06 NOTE — Telephone Encounter (Signed)
Pt got oxycodone # 60 on 5/24 from Dr Annette Stable, on 5/23 #60 from Dr Annette Stable from a different pharamcy, and #60 4/6 from Dr Ronnald Ramp (not [place])

## 2014-11-07 DIAGNOSIS — M5126 Other intervertebral disc displacement, lumbar region: Secondary | ICD-10-CM | POA: Diagnosis not present

## 2014-11-07 DIAGNOSIS — G8929 Other chronic pain: Secondary | ICD-10-CM | POA: Diagnosis not present

## 2014-11-07 DIAGNOSIS — M5442 Lumbago with sciatica, left side: Secondary | ICD-10-CM | POA: Diagnosis not present

## 2014-11-09 ENCOUNTER — Inpatient Hospital Stay: Admission: RE | Admit: 2014-11-09 | Payer: Medicare Other | Source: Ambulatory Visit

## 2014-11-13 ENCOUNTER — Other Ambulatory Visit: Payer: Self-pay | Admitting: *Deleted

## 2014-11-13 MED ORDER — AMLODIPINE BESYLATE 10 MG PO TABS: 10.0000 mg | ORAL_TABLET | Freq: Every day | ORAL | Status: DC

## 2014-11-20 DIAGNOSIS — M5442 Lumbago with sciatica, left side: Secondary | ICD-10-CM | POA: Diagnosis not present

## 2014-11-20 DIAGNOSIS — G8929 Other chronic pain: Secondary | ICD-10-CM | POA: Diagnosis not present

## 2014-11-27 ENCOUNTER — Ambulatory Visit
Admission: RE | Admit: 2014-11-27 | Discharge: 2014-11-27 | Disposition: A | Discharge status: Home / Self Care | Payer: Medicare Other | Source: Ambulatory Visit | Attending: General Surgery | Admitting: General Surgery

## 2014-11-27 ENCOUNTER — Other Ambulatory Visit: Payer: Self-pay | Admitting: Internal Medicine

## 2014-11-27 DIAGNOSIS — Z853 Personal history of malignant neoplasm of breast: Secondary | ICD-10-CM

## 2014-11-27 NOTE — Telephone Encounter (Signed)
Monique Henderson.

## 2014-11-27 NOTE — Telephone Encounter (Signed)
Called pt - no answer; left message to call the clinic. 

## 2014-11-27 NOTE — Telephone Encounter (Signed)
This has not been refilled since November. Can you confirm with patient if she is still taking this? Thank you

## 2014-11-28 ENCOUNTER — Telehealth: Payer: Self-pay | Admitting: Internal Medicine

## 2014-11-28 MED ORDER — TRAMADOL HCL 50 MG PO TABS: 50.0000 mg | ORAL_TABLET | Freq: Four times a day (QID) | ORAL | Status: DC | PRN

## 2014-11-28 NOTE — Telephone Encounter (Signed)
Pharmacist requested DEA# for Dr Dareen Piano.

## 2014-11-28 NOTE — Telephone Encounter (Signed)
Pt states sh has been taking Tramadol,as needed, until she ran out.  States she took Gabapentin but has not controlled the pain completely so she takes both medications.

## 2014-11-28 NOTE — Telephone Encounter (Signed)
Tramadol rx called to [facility]. 

## 2014-11-28 NOTE — Telephone Encounter (Signed)
Needs clarification about doctors information for a refill

## 2014-12-11 DIAGNOSIS — M5442 Lumbago with sciatica, left side: Secondary | ICD-10-CM | POA: Diagnosis not present

## 2014-12-11 DIAGNOSIS — G8929 Other chronic pain: Secondary | ICD-10-CM | POA: Diagnosis not present

## 2014-12-20 ENCOUNTER — Other Ambulatory Visit: Payer: Self-pay | Admitting: *Deleted

## 2014-12-20 MED ORDER — FAMOTIDINE 20 MG PO TABS: 20.0000 mg | ORAL_TABLET | Freq: Every day | ORAL | Status: DC

## 2014-12-20 MED ORDER — ALBUTEROL SULFATE HFA 108 (90 BASE) MCG/ACT IN AERS: 2.0000 | INHALATION_SPRAY | Freq: Four times a day (QID) | RESPIRATORY_TRACT | Status: DC | PRN

## 2014-12-20 MED ORDER — LOSARTAN POTASSIUM 25 MG PO TABS: 50.0000 mg | ORAL_TABLET | Freq: Every day | ORAL | Status: DC

## 2014-12-20 MED ORDER — CLONIDINE HCL 0.1 MG PO TABS: 0.1000 mg | ORAL_TABLET | Freq: Two times a day (BID) | ORAL | Status: DC

## 2014-12-24 ENCOUNTER — Ambulatory Visit: Payer: Medicare Other | Admitting: Internal Medicine

## 2014-12-31 ENCOUNTER — Other Ambulatory Visit: Payer: Self-pay | Admitting: Internal Medicine

## 2015-01-01 ENCOUNTER — Telehealth: Payer: Self-pay | Admitting: Internal Medicine

## 2015-01-09 DIAGNOSIS — M5442 Lumbago with sciatica, left side: Secondary | ICD-10-CM | POA: Diagnosis not present

## 2015-01-09 DIAGNOSIS — G8929 Other chronic pain: Secondary | ICD-10-CM | POA: Diagnosis not present

## 2015-01-09 DIAGNOSIS — I10 Essential (primary) hypertension: Secondary | ICD-10-CM | POA: Diagnosis not present

## 2015-01-09 DIAGNOSIS — M5126 Other intervertebral disc displacement, lumbar region: Secondary | ICD-10-CM | POA: Diagnosis not present

## 2015-01-30 ENCOUNTER — Other Ambulatory Visit: Payer: Self-pay | Admitting: Internal Medicine

## 2015-01-31 NOTE — Telephone Encounter (Signed)
Rx called in to pharmacy. 

## 2015-02-25 ENCOUNTER — Other Ambulatory Visit: Payer: Self-pay | Admitting: Student in an Organized Health Care Education/Training Program

## 2015-02-25 MED ORDER — TRAMADOL HCL 50 MG PO TABS: 50.0000 mg | ORAL_TABLET | Freq: Four times a day (QID) | ORAL | Status: DC | PRN

## 2015-02-25 NOTE — Telephone Encounter (Signed)
Rx called in to pharmacy. 

## 2015-02-26 ENCOUNTER — Other Ambulatory Visit: Payer: Self-pay | Admitting: Student in an Organized Health Care Education/Training Program

## 2015-02-26 NOTE — Telephone Encounter (Signed)
Called pharm, it is ready for pick up, pt was called and informed

## 2015-02-26 NOTE — Telephone Encounter (Signed)
Pt called requesting tramadol to be filled.

## 2015-03-08 ENCOUNTER — Other Ambulatory Visit: Payer: Self-pay | Admitting: Student in an Organized Health Care Education/Training Program

## 2015-03-08 ENCOUNTER — Other Ambulatory Visit: Payer: Self-pay | Admitting: Internal Medicine

## 2015-03-08 NOTE — Telephone Encounter (Signed)
Pt called requesting Ambien to be filled @ [facility].

## 2015-03-14 ENCOUNTER — Encounter: Payer: Self-pay | Admitting: Internal Medicine

## 2015-03-14 ENCOUNTER — Ambulatory Visit (INDEPENDENT_AMBULATORY_CARE_PROVIDER_SITE_OTHER): Payer: Medicare Other | Admitting: Internal Medicine

## 2015-03-14 VITALS — BP 140/91 | HR 69 | Temp 98.0°F | Ht 63.0 in | Wt 148.5 lb

## 2015-03-14 DIAGNOSIS — R252 Cramp and spasm: Secondary | ICD-10-CM | POA: Diagnosis not present

## 2015-03-14 DIAGNOSIS — N183 Chronic kidney disease, stage 3 unspecified: Secondary | ICD-10-CM

## 2015-03-14 DIAGNOSIS — M545 Low back pain: Secondary | ICD-10-CM | POA: Diagnosis not present

## 2015-03-14 DIAGNOSIS — G4700 Insomnia, unspecified: Secondary | ICD-10-CM

## 2015-03-14 DIAGNOSIS — F17200 Nicotine dependence, unspecified, uncomplicated: Secondary | ICD-10-CM

## 2015-03-14 HISTORY — DX: Insomnia, unspecified: G47.00

## 2015-03-14 LAB — BASIC METABOLIC PANEL
Anion gap: 8 (ref 5–15)
BUN: 18 mg/dL (ref 6–20)
CHLORIDE: 106 mmol/L (ref 101–111)
CO2: 30 mmol/L (ref 22–32)
Calcium: 9.8 mg/dL (ref 8.9–10.3)
Creatinine, Ser: 1.95 mg/dL — ABNORMAL HIGH (ref 0.44–1.00)
GFR calc non Af Amer: 26 mL/min — ABNORMAL LOW (ref >60)
GFR, EST AFRICAN AMERICAN: 31 mL/min — AB (ref >60)
Glucose, Bld: 98 mg/dL (ref 65–99)
POTASSIUM: 4.2 mmol/L (ref 3.5–5.1)
Sodium: 144 mmol/L (ref 135–145)

## 2015-03-14 MED ORDER — ZOLPIDEM TARTRATE 5 MG PO TABS: 5.0000 mg | ORAL_TABLET | Freq: Every evening | ORAL | Status: DC | PRN

## 2015-03-14 NOTE — Assessment & Plan Note (Addendum)
ADDENDUM 03/14/2015  5:21 PM:  GFR closer to 30 than from prior value last year albeit stable Stage 3 disease. No prior renal US, PTH, or referral to Nephrology on file. Ca reassuring on labwork today.

## 2015-03-14 NOTE — Patient Instructions (Addendum)
I will call you with results of your labwork later today and instructions on the potassium supplement.   Please return in 1 week for lab recheck. You may get your labwork first before your visit if there is a wait time.   For additional refills of Ambien, please schedule a visit with Dr. Evette Doffing.

## 2015-03-14 NOTE — Progress Notes (Signed)
   Subjective:    Patient ID: Monique Henderson, female    DOB: 1950-10-07, 64 y.o.   MRN: [id number]  HPI Monique Henderson is a 64 year old female with CKD Stage 3, hypertension, GERD, HCV, osteoarthritis who presents today for follow-up visit. Please see assessment & plan for documentation of chronic medical problems.    Review of Systems  Respiratory: Negative for shortness of breath.   Cardiovascular: Negative for chest pain, palpitations and leg swelling.  Musculoskeletal: Positive for myalgias.  Neurological: Negative for dizziness, weakness and headaches.  Psychiatric/Behavioral: Positive for sleep disturbance.       Objective:   Physical Exam Constitutional: Overweight, middle aged [place] American female. No distress.  Head: Normocephalic and atraumatic.  Eyes: Conjunctivae are normal. Pupils are 3 mm, direct, consensual, near.  Cardiovascular: Normal rate, regular rhythm and normal heart sounds.  No gallop, friction rub, murmur heard. Pulmonary/Chest: Effort normal. No respiratory distress. No wheezes, rales.  Abdominal: Soft. Bowel sounds are normal. No distension. No tenderness.  Neurological: Alert and oriented to person, place, and time. Coordination normal. 5/5 hip extension and flexion bilaterally. Skin: Warm and dry. Not diaphoretic.  Psychiatric: Affect mobile and congruent with thought content         Assessment & Plan:

## 2015-03-14 NOTE — Assessment & Plan Note (Addendum)
Occurs throughout the day without a pattern. Previously received Kdur though is out of refills. Denies any recent diarrheal illness or strenuous activity. Adherent to losartan 50mg  daily. Advised her that we would first check BMET to avoid the risk of hyperkalemia given that she is on ARB to which she was agreeable.  ADDENDUM 03/14/2015  5:27 PM:  K 4.2 which cannot account for her symptoms. Called patient and left message to call clinic back. TSH pending.  ADDENDUM 03/15/2015  9:54 AM:  TSH reassuring. Need to consider other causes of her myalgias so should follow-up next week.

## 2015-03-14 NOTE — Assessment & Plan Note (Signed)
She is currently smoking a pack/week and would like to quit though is short on time but would like to discuss further with PCP.

## 2015-03-14 NOTE — Assessment & Plan Note (Signed)
She is requesting Ambien today. Chart review notable for refill of 30 tablets back in April. Needs 1 tablet/week per her report. Prescribed 30 tablets today with instructions to follow-up with PCP for future refills to which she is agreeable.

## 2015-03-14 NOTE — Assessment & Plan Note (Signed)
ADDENDUM 03/14/2015  5:42 PM:  Review of [place] DEA database notable for monthly refills of oxycodone 5mg  x 90 tablets since June 2016 prescribed by two providers affiliated from [place]. She has also been receiving Tramadol 50mg  x 120 tablets from [place] during this interval as well. This needs to be discussed at follow-up with PCP.

## 2015-03-15 LAB — TSH: TSH: 0.599 u[IU]/mL (ref 0.450–4.500)

## 2015-03-15 NOTE — Addendum Note (Signed)
Addended by: Riccardo Dubin on: 03/15/2015 09:54 AM   Modules accepted: Miquel Dunn

## 2015-03-16 NOTE — Progress Notes (Signed)
Internal Medicine Clinic Attending  Case discussed with Dr. Patel,Rushil at the time of the visit.  We reviewed the resident's history and exam and pertinent patient test results.  I agree with the assessment, diagnosis, and plan of care documented in the resident's note.  

## 2015-03-22 ENCOUNTER — Ambulatory Visit (INDEPENDENT_AMBULATORY_CARE_PROVIDER_SITE_OTHER): Payer: Medicare Other | Admitting: Internal Medicine

## 2015-03-22 ENCOUNTER — Encounter: Payer: Self-pay | Admitting: Internal Medicine

## 2015-03-22 VITALS — BP 138/90 | HR 64 | Temp 98.5°F | Ht 63.0 in | Wt 145.9 lb

## 2015-03-22 DIAGNOSIS — M25561 Pain in right knee: Secondary | ICD-10-CM | POA: Diagnosis not present

## 2015-03-22 DIAGNOSIS — R252 Cramp and spasm: Secondary | ICD-10-CM

## 2015-03-22 DIAGNOSIS — M1711 Unilateral primary osteoarthritis, right knee: Secondary | ICD-10-CM

## 2015-03-22 DIAGNOSIS — M25661 Stiffness of right knee, not elsewhere classified: Secondary | ICD-10-CM

## 2015-03-22 DIAGNOSIS — F17210 Nicotine dependence, cigarettes, uncomplicated: Secondary | ICD-10-CM

## 2015-03-22 NOTE — Patient Instructions (Signed)
[name] it was nice meeting you today.  -Please stay hydrated and do leg stretching exercises as we discussed today. This should help you with the leg cramps, however, if your cramps do not resolve or become worse please return to the clinic.  -Take over-the-counter Tylenol as needed for knee pain.  Leg Cramps Leg cramps occur when a muscle or muscles tighten and you have no control over this tightening (involuntary muscle contraction). Muscle cramps can develop in any muscle, but the most common place is in the calf muscles of the leg. Those cramps can occur during exercise or when you are at rest. Leg cramps are painful, and they may last for a few seconds to a few minutes. Cramps may return several times before they finally stop. Usually, leg cramps are not caused by a serious medical problem. In many cases, the cause is not known. Some common causes include:  Overexertion.  Overuse from repetitive motions, or doing the same thing over and over.  Remaining in a certain position for a long period of time.  Improper preparation, form, or technique while performing a sport or an activity.  Dehydration.  Injury.  Side effects of some medicines.  Abnormally low levels of the salts and ions in your blood (electrolytes), especially potassium and calcium. These levels could be low if you are taking water pills (diuretics) or if you are pregnant. HOME CARE INSTRUCTIONS Watch your condition for any changes. Taking the following actions may help to lessen any discomfort that you are feeling:  Stay well-hydrated. Drink enough fluid to keep your urine clear or pale yellow.  Try massaging, stretching, and relaxing the affected muscle. Do this for several minutes at a time.  For tight or tense muscles, use a warm towel, heating pad, or hot shower water directed to the affected area.  If you are sore or have pain after a cramp, applying ice to the affected area may relieve  discomfort.  Put ice in a plastic bag.  Place a towel between your skin and the bag.  Leave the ice on for 20 minutes, 2-3 times per day.  Avoid strenuous exercise for several days if you have been having frequent leg cramps.  Make sure that your diet includes the essential minerals for your muscles to work normally.  Take medicines only as directed by your health care provider. SEEK MEDICAL CARE IF:  Your leg cramps get more severe or more frequent, or they do not improve over time.  Your foot becomes cold, numb, or blue.   This information is not intended to replace advice given to you by your health care provider. Make sure you discuss any questions you have with your health care provider.   Document Released: 06/25/2004 Document Revised: 10/02/2014 Document Reviewed: 04/25/2014 Elsevier Interactive Patient Education [employer].

## 2015-03-24 NOTE — Progress Notes (Signed)
Patient ID: Monique Henderson, female   DOB: 11-24-1950, 64 y.o.   MRN: [id number]   Subjective:   Patient ID: Monique Henderson female   DOB: 03-30-1951 64 y.o.   MRN: [id number]  HPI: [name] is a 64 y.o. 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 Please see assessment and plan for the status of the patient's chronic medical conditions.     Past Medical History  Diagnosis Date  . GERD (gastroesophageal reflux disease)   . Hepatitis C   . Hypertension   . Low back pain   . Uterine fibroid   . Normocytic anemia     With thrombocytosis  . Right ureteral stone 2002  . Atelectasis 2002    Bilateral  . Bone spur 2008    Right calcaneal foot spur  . Lumbar spinal stenosis     S/P lumbar decompressive laminectomy, fusion and plating for lumbar spinal stensosis  . Hot flashes   . Wears dentures     top  . Hx of radiation therapy 2005    left breast  . 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 Autumn Messing; S/P re-excision of superior and inferior margins 10/27/2012.   . Osteoarthritis   . Shortness of breath     from pain  . Hx of radiation therapy 01/11/13- 03/22/13    right breast 5760 cGy 30 sessions  . Breast cancer ([place]) 2004    Ductal carcinoma in situ of the left breast; S/P left partial mastectomy 02/26/2003; S/P re-excision of cranial and lateral margins11/18/2004.radiation  . Breast cancer ([place]) 09/21/2012    right breast/ last radiation treatment 03/22/2013   Current Outpatient Prescriptions  Medication Sig Dispense Refill  . albuterol (PROAIR HFA) 108 (90 BASE) MCG/ACT inhaler Inhale 2 puffs into the lungs every 6 (six) hours as needed for wheezing or shortness of breath. 18 g 3  . amLODipine (NORVASC) 10 MG tablet Take 1 tablet (10 mg total) by mouth daily. 90  tablet 3  . B Complex Vitamins (VITAMIN B COMPLEX PO) Take 1 tablet by mouth daily.     . cetirizine (ZYRTEC) 10 MG tablet TAKE ONE TABLET BY MOUTH ONCE DAILY 30 tablet 3  . cloNIDine (CATAPRES) 0.1 MG tablet Take 1 tablet (0.1 mg total) by mouth 2 (two) times daily. 180 tablet 3  . famotidine (PEPCID) 20 MG tablet Take 1 tablet (20 mg total) by mouth at bedtime. 90 tablet 3  . gabapentin (NEURONTIN) 300 MG capsule Take 1 capsule (300 mg total) by mouth 3 (three) times daily. 90 capsule 0  . losartan (COZAAR) 25 MG tablet Take 2 tablets (50 mg total) by mouth daily. 180 tablet 3  . oxyCODONE-acetaminophen (PERCOCET/ROXICET) 5-325 MG per tablet Take 1 tablet by mouth every 6 (six) hours as needed for moderate pain.     . traMADol (ULTRAM) 50 MG tablet Take 1 tablet (50 mg total) by mouth every 6 (six) hours as needed. 120 tablet 2  . triamcinolone cream (KENALOG) 0.1 % Apply 1 application topically 2 (two) times daily. To affected area 30 g 1  . Vitamin D, Ergocalciferol, (DRISDOL) 50000 UNITS CAPS capsule Take 1 capsule (50,000 Units total) by mouth every 7 (seven) days. 6 capsule 1  . zolpidem (AMBIEN) 5  MG tablet Take 1 tablet (5 mg total) by mouth at bedtime as needed for sleep. 30 tablet 0  . [DISCONTINUED] lisinopril (PRINIVIL,ZESTRIL) 10 MG tablet Take 1 tablet (10 mg total) by mouth daily. 30 tablet 6   No current facility-administered medications for this visit.   Family History  Problem Relation Age of Onset  . Colon cancer Mother 74  . Hypertension Mother   . Diabetes Sister 31  . Hypertension Sister   . Breast cancer Neg Hx   . Cervical cancer Neg Hx   . Diabetes Brother   . Hypertension Brother   . Diabetes Brother   . Hypertension Brother   . Kidney disease Son     On dialysis  . Hypertension Son   . Diabetes Son   . Multiple sclerosis Son   . Bone cancer Sister 20   Social History   Social History  . Marital Status: Single    Spouse Name: N/A  . Number of  Children: N/A  . Years of Education: N/A   Social History Main Topics  . Smoking status: Current Some Day Smoker -- 0.25 packs/day for 44 years    Types: Cigarettes  . Smokeless tobacco: Never Used     Comment: smoking less  . Alcohol Use: 1.2 oz/week    2 Cans of beer per week     Comment:  "couple beers q weekend"  . Drug Use: Yes    Special: Cocaine     Comment: "stopped back in the 1980's"  . Sexual Activity: Yes    Birth Control/ Protection: Post-menopausal   Other Topics Concern  . None   Social History Narrative   Review of Systems: Review of Systems  Constitutional: Negative for fever and chills.  HENT: Negative for ear pain.   Eyes: Negative for blurred vision and pain.  Respiratory: Negative for cough, shortness of breath and wheezing.   Cardiovascular: Negative for chest pain, palpitations and leg swelling.  Gastrointestinal: Negative for nausea, vomiting, abdominal pain, diarrhea and constipation.  Genitourinary: Negative for dysuria, urgency and frequency.  Musculoskeletal: Positive for joint pain.       Muscles cramps in legs and feet.   Skin: Negative for itching and rash.  Neurological: Negative for dizziness, sensory change, focal weakness and headaches.   Objective:  Physical Exam: Filed Vitals:   03/22/15 1014  BP: 138/90  Pulse: 64  Temp: 98.5 F (36.9 C)  TempSrc: Oral  Height: 5\' 3"  (1.6 m)  Weight: 145 lb 14.4 oz (66.18 kg)  SpO2: 100%   Physical Exam  Constitutional: She is oriented to person, place, and time. She appears well-developed and well-nourished. No distress.  HENT:  Head: Normocephalic and atraumatic.  Eyes: EOM are normal. Pupils are equal, round, and reactive to light.  Neck: Neck supple. No tracheal deviation present.  Cardiovascular: Normal rate, regular rhythm and intact distal pulses.   Pulmonary/Chest: Effort normal. No respiratory distress. She has no wheezes. She has no rales.  Abdominal: Soft. Bowel sounds are  normal. She exhibits no distension. There is no tenderness.  Musculoskeletal: Normal range of motion. She exhibits no edema or tenderness.  Bilateral lower extremities symmetrical in size. Pulses intact. No pain on palpation of calf muscles.   Neurological: She is alert and oriented to person, place, and time.  Skin: Skin is warm and dry.   Assessment & Plan:

## 2015-03-24 NOTE — Assessment & Plan Note (Signed)
Patient still continues to compliant of painful nocturnal cramps in her legs and feet. States she notices her toes curling when she gets cramps. Reports drinking plenty of water and staying hydrated at all times. Labs ordered during previous visit showing normal K and TSH. Patient is not on any statins. Her cramps are likely secondary to an electrolyte abnormality. Not likely claudication because pulses intact and she does not have leg pain when walking. Not likely RLS because she has visible cramping of her muscles with toes curling. I wanted to order additional labs including Mag, SCr, and CK to determine the etiology of her cramps but patient declined blood work at this visit.  -Recommended stretching exercises and light physical activity such as walking -If she still continues to complaint of cramps at the next visit, please order the labs mentioned above.

## 2015-03-24 NOTE — Assessment & Plan Note (Signed)
Patient is complaining of a one month history of R knee pain and stiffness. States she cannot bend her knee all the way when squatting. In addition, reports knee pain when walking. Physical exam showing no pain on palpation, no effusion, and normal ROM of both knees. Her symptoms could be due to age related OA. -Recommended OTC Tylenol prn -If she still continues to complain of knee pain at future visits, please consider ordering imaging.

## 2015-03-25 NOTE — Progress Notes (Signed)
Internal Medicine Clinic Attending  I saw and evaluated the patient.  I personally confirmed the key portions of the history and exam documented by Dr. Rathore and I reviewed pertinent patient test results.  The assessment, diagnosis, and plan were formulated together and I agree with the documentation in the resident's note.  

## 2015-04-04 DIAGNOSIS — G8929 Other chronic pain: Secondary | ICD-10-CM | POA: Diagnosis not present

## 2015-04-04 DIAGNOSIS — M5126 Other intervertebral disc displacement, lumbar region: Secondary | ICD-10-CM | POA: Diagnosis not present

## 2015-04-04 DIAGNOSIS — Z5181 Encounter for therapeutic drug level monitoring: Secondary | ICD-10-CM | POA: Diagnosis not present

## 2015-04-04 DIAGNOSIS — Z79899 Other long term (current) drug therapy: Secondary | ICD-10-CM | POA: Diagnosis not present

## 2015-04-04 DIAGNOSIS — Z6825 Body mass index (BMI) 25.0-25.9, adult: Secondary | ICD-10-CM | POA: Diagnosis not present

## 2015-04-04 DIAGNOSIS — Z79891 Long term (current) use of opiate analgesic: Secondary | ICD-10-CM | POA: Diagnosis not present

## 2015-04-04 DIAGNOSIS — M5442 Lumbago with sciatica, left side: Secondary | ICD-10-CM | POA: Diagnosis not present

## 2015-05-08 DIAGNOSIS — D0511 Intraductal carcinoma in situ of right breast: Secondary | ICD-10-CM | POA: Diagnosis not present

## 2015-06-01 ENCOUNTER — Emergency Department (HOSPITAL_COMMUNITY): Payer: Medicare Other

## 2015-06-01 ENCOUNTER — Encounter (HOSPITAL_COMMUNITY): Payer: Self-pay

## 2015-06-01 ENCOUNTER — Emergency Department (HOSPITAL_COMMUNITY)
Admission: EM | Admit: 2015-06-01 | Discharge: 2015-06-02 | Disposition: A | Discharge status: Home / Self Care | Payer: Medicare Other | Attending: Emergency Medicine | Admitting: Emergency Medicine

## 2015-06-01 DIAGNOSIS — M1990 Unspecified osteoarthritis, unspecified site: Secondary | ICD-10-CM | POA: Insufficient documentation

## 2015-06-01 DIAGNOSIS — Z862 Personal history of diseases of the blood and blood-forming organs and certain disorders involving the immune mechanism: Secondary | ICD-10-CM | POA: Diagnosis not present

## 2015-06-01 DIAGNOSIS — T148 Other injury of unspecified body region: Secondary | ICD-10-CM | POA: Diagnosis not present

## 2015-06-01 DIAGNOSIS — Z8742 Personal history of other diseases of the female genital tract: Secondary | ICD-10-CM | POA: Diagnosis not present

## 2015-06-01 DIAGNOSIS — T148XXA Other injury of unspecified body region, initial encounter: Secondary | ICD-10-CM

## 2015-06-01 DIAGNOSIS — Z923 Personal history of irradiation: Secondary | ICD-10-CM | POA: Insufficient documentation

## 2015-06-01 DIAGNOSIS — Z8619 Personal history of other infectious and parasitic diseases: Secondary | ICD-10-CM | POA: Diagnosis not present

## 2015-06-01 DIAGNOSIS — Y998 Other external cause status: Secondary | ICD-10-CM | POA: Diagnosis not present

## 2015-06-01 DIAGNOSIS — S199XXA Unspecified injury of neck, initial encounter: Secondary | ICD-10-CM | POA: Insufficient documentation

## 2015-06-01 DIAGNOSIS — Z853 Personal history of malignant neoplasm of breast: Secondary | ICD-10-CM | POA: Diagnosis not present

## 2015-06-01 DIAGNOSIS — S0990XA Unspecified injury of head, initial encounter: Secondary | ICD-10-CM | POA: Diagnosis not present

## 2015-06-01 DIAGNOSIS — M791 Myalgia: Secondary | ICD-10-CM | POA: Diagnosis not present

## 2015-06-01 DIAGNOSIS — Z86018 Personal history of other benign neoplasm: Secondary | ICD-10-CM | POA: Diagnosis not present

## 2015-06-01 DIAGNOSIS — Z87442 Personal history of urinary calculi: Secondary | ICD-10-CM | POA: Insufficient documentation

## 2015-06-01 DIAGNOSIS — Z98811 Dental restoration status: Secondary | ICD-10-CM | POA: Diagnosis not present

## 2015-06-01 DIAGNOSIS — S299XXA Unspecified injury of thorax, initial encounter: Secondary | ICD-10-CM | POA: Diagnosis present

## 2015-06-01 DIAGNOSIS — S3992XA Unspecified injury of lower back, initial encounter: Secondary | ICD-10-CM | POA: Insufficient documentation

## 2015-06-01 DIAGNOSIS — S2231XA Fracture of one rib, right side, initial encounter for closed fracture: Secondary | ICD-10-CM

## 2015-06-01 DIAGNOSIS — Y92410 Unspecified street and highway as the place of occurrence of the external cause: Secondary | ICD-10-CM | POA: Insufficient documentation

## 2015-06-01 DIAGNOSIS — F17210 Nicotine dependence, cigarettes, uncomplicated: Secondary | ICD-10-CM | POA: Diagnosis not present

## 2015-06-01 DIAGNOSIS — Z79899 Other long term (current) drug therapy: Secondary | ICD-10-CM | POA: Diagnosis not present

## 2015-06-01 DIAGNOSIS — S79922A Unspecified injury of left thigh, initial encounter: Secondary | ICD-10-CM | POA: Insufficient documentation

## 2015-06-01 DIAGNOSIS — I10 Essential (primary) hypertension: Secondary | ICD-10-CM | POA: Diagnosis not present

## 2015-06-01 DIAGNOSIS — K219 Gastro-esophageal reflux disease without esophagitis: Secondary | ICD-10-CM | POA: Insufficient documentation

## 2015-06-01 DIAGNOSIS — S79921A Unspecified injury of right thigh, initial encounter: Secondary | ICD-10-CM | POA: Insufficient documentation

## 2015-06-01 DIAGNOSIS — S4992XA Unspecified injury of left shoulder and upper arm, initial encounter: Secondary | ICD-10-CM | POA: Insufficient documentation

## 2015-06-01 DIAGNOSIS — Y9389 Activity, other specified: Secondary | ICD-10-CM | POA: Diagnosis not present

## 2015-06-01 DIAGNOSIS — S2241XA Multiple fractures of ribs, right side, initial encounter for closed fracture: Secondary | ICD-10-CM | POA: Insufficient documentation

## 2015-06-01 DIAGNOSIS — Z8709 Personal history of other diseases of the respiratory system: Secondary | ICD-10-CM | POA: Diagnosis not present
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
## 2015-06-02 DIAGNOSIS — F17210 Nicotine dependence, cigarettes, uncomplicated: Secondary | ICD-10-CM | POA: Diagnosis not present

## 2015-06-02 DIAGNOSIS — Z98811 Dental restoration status: Secondary | ICD-10-CM | POA: Diagnosis not present

## 2015-06-02 DIAGNOSIS — Z87442 Personal history of urinary calculi: Secondary | ICD-10-CM | POA: Diagnosis not present

## 2015-06-02 DIAGNOSIS — M542 Cervicalgia: Secondary | ICD-10-CM | POA: Diagnosis not present

## 2015-06-02 DIAGNOSIS — Z8619 Personal history of other infectious and parasitic diseases: Secondary | ICD-10-CM | POA: Diagnosis not present

## 2015-06-02 DIAGNOSIS — S79921A Unspecified injury of right thigh, initial encounter: Secondary | ICD-10-CM | POA: Diagnosis not present

## 2015-06-02 DIAGNOSIS — K219 Gastro-esophageal reflux disease without esophagitis: Secondary | ICD-10-CM | POA: Diagnosis not present

## 2015-06-02 DIAGNOSIS — Z8709 Personal history of other diseases of the respiratory system: Secondary | ICD-10-CM | POA: Diagnosis not present

## 2015-06-02 DIAGNOSIS — S299XXA Unspecified injury of thorax, initial encounter: Secondary | ICD-10-CM | POA: Diagnosis present

## 2015-06-02 DIAGNOSIS — Z8742 Personal history of other diseases of the female genital tract: Secondary | ICD-10-CM | POA: Diagnosis not present

## 2015-06-02 DIAGNOSIS — M25511 Pain in right shoulder: Secondary | ICD-10-CM | POA: Diagnosis not present

## 2015-06-02 DIAGNOSIS — Y9389 Activity, other specified: Secondary | ICD-10-CM | POA: Diagnosis not present

## 2015-06-02 DIAGNOSIS — Z853 Personal history of malignant neoplasm of breast: Secondary | ICD-10-CM | POA: Diagnosis not present

## 2015-06-02 DIAGNOSIS — Z86018 Personal history of other benign neoplasm: Secondary | ICD-10-CM | POA: Diagnosis not present

## 2015-06-02 DIAGNOSIS — S4992XA Unspecified injury of left shoulder and upper arm, initial encounter: Secondary | ICD-10-CM | POA: Diagnosis not present

## 2015-06-02 DIAGNOSIS — S3992XA Unspecified injury of lower back, initial encounter: Secondary | ICD-10-CM | POA: Diagnosis not present

## 2015-06-02 DIAGNOSIS — M79605 Pain in left leg: Secondary | ICD-10-CM | POA: Diagnosis not present

## 2015-06-02 DIAGNOSIS — S199XXA Unspecified injury of neck, initial encounter: Secondary | ICD-10-CM | POA: Diagnosis not present

## 2015-06-02 DIAGNOSIS — Y998 Other external cause status: Secondary | ICD-10-CM | POA: Diagnosis not present

## 2015-06-02 DIAGNOSIS — S4991XA Unspecified injury of right shoulder and upper arm, initial encounter: Secondary | ICD-10-CM | POA: Diagnosis not present

## 2015-06-02 DIAGNOSIS — S79922A Unspecified injury of left thigh, initial encounter: Secondary | ICD-10-CM | POA: Diagnosis not present

## 2015-06-02 DIAGNOSIS — Z862 Personal history of diseases of the blood and blood-forming organs and certain disorders involving the immune mechanism: Secondary | ICD-10-CM | POA: Diagnosis not present

## 2015-06-02 DIAGNOSIS — M1990 Unspecified osteoarthritis, unspecified site: Secondary | ICD-10-CM | POA: Diagnosis not present

## 2015-06-02 DIAGNOSIS — I10 Essential (primary) hypertension: Secondary | ICD-10-CM | POA: Diagnosis not present

## 2015-06-02 DIAGNOSIS — S2241XA Multiple fractures of ribs, right side, initial encounter for closed fracture: Secondary | ICD-10-CM | POA: Diagnosis not present

## 2015-06-02 DIAGNOSIS — Z79899 Other long term (current) drug therapy: Secondary | ICD-10-CM | POA: Diagnosis not present

## 2015-06-02 DIAGNOSIS — T148 Other injury of unspecified body region: Secondary | ICD-10-CM | POA: Diagnosis not present

## 2015-06-02 DIAGNOSIS — Z923 Personal history of irradiation: Secondary | ICD-10-CM | POA: Diagnosis not present

## 2015-06-02 DIAGNOSIS — Y92410 Unspecified street and highway as the place of occurrence of the external cause: Secondary | ICD-10-CM | POA: Diagnosis not present

## 2015-06-02 MED ORDER — ONDANSETRON HCL 4 MG PO TABS: 4.0000 mg | ORAL_TABLET | Freq: Four times a day (QID) | ORAL | Status: DC

## 2015-06-02 MED ORDER — OXYCODONE HCL 5 MG PO TABS: 5.0000 mg | ORAL_TABLET | Freq: Four times a day (QID) | ORAL | Status: DC | PRN

## 2015-06-02 MED ORDER — ONDANSETRON 4 MG PO TBDP
8.0000 mg | ORAL_TABLET | Freq: Once | ORAL | Status: AC
  Administered 2015-06-02: 8 mg via ORAL
  Filled 2015-06-02: qty 2

## 2015-06-02 MED ORDER — HYDROMORPHONE HCL 1 MG/ML IJ SOLN
1.0000 mg | Freq: Once | INTRAMUSCULAR | Status: AC
  Administered 2015-06-02: 1 mg via INTRAMUSCULAR
  Filled 2015-06-02: qty 1

## 2015-06-02 NOTE — ED Notes (Signed)
Pt resting quietly upon return to room, no verbalization of discomfort. As soon as pt saw RN, pt began to moan and cry as if in pain.

## 2015-06-02 NOTE — ED Notes (Signed)
Pt ambulatory to restroom independently.

## 2015-06-02 NOTE — Discharge Instructions (Signed)
Rib Fracture A rib fracture is a break or crack in one of the bones of the ribs. The ribs are a group of long, curved bones that wrap around your chest and attach to your spine. They protect your lungs and other organs in the chest cavity. A broken or cracked rib is often painful, but most do not cause other problems. Most rib fractures heal on their own over time. However, rib fractures can be more serious if multiple ribs are broken or if broken ribs move out of place and push against other structures. CAUSES   A direct blow to the chest. For example, this could happen during contact sports, a car accident, or a fall against a hard object.  Repetitive movements with high force, such as pitching a baseball or having severe coughing spells. SYMPTOMS   Pain when you breathe in or cough.  Pain when someone presses on the injured area. DIAGNOSIS  Your caregiver will perform a physical exam. Various imaging tests may be ordered to confirm the diagnosis and to look for related injuries. These tests may include a chest X-ray, computed tomography (CT), magnetic resonance imaging (MRI), or a bone scan. TREATMENT  Rib fractures usually heal on their own in 1-3 months. The longer healing period is often associated with a continued cough or other aggravating activities. During the healing period, pain control is very important. Medication is usually given to control pain. Hospitalization or surgery may be needed for more severe injuries, such as those in which multiple ribs are broken or the ribs have moved out of place.  HOME CARE INSTRUCTIONS   Avoid strenuous activity and any activities or movements that cause pain. Be careful during activities and avoid bumping the injured rib.  Gradually increase activity as directed by your caregiver.  Only take over-the-counter or prescription medications as directed by your caregiver. Do not take other medications without asking your caregiver first.  Apply ice  to the injured area for the first 1-2 days after you have been treated or as directed by your caregiver. Applying ice helps to reduce inflammation and pain.  Put ice in a plastic bag.  Place a towel between your skin and the bag.   Leave the ice on for 15-20 minutes at a time, every 2 hours while you are awake.  Perform deep breathing as directed by your caregiver. This will help prevent pneumonia, which is a common complication of a broken rib. Your caregiver may instruct you to:  Take deep breaths several times a day.  Try to cough several times a day, holding a pillow against the injured area.  Use a device called an incentive spirometer to practice deep breathing several times a day.  Drink enough fluids to keep your urine clear or pale yellow. This will help you avoid constipation.   Do not wear a rib belt or binder. These restrict breathing, which can lead to pneumonia.  SEEK IMMEDIATE MEDICAL CARE IF:   You have a fever.   You have difficulty breathing or shortness of breath.   You develop a continual cough, or you cough up thick or bloody sputum.  You feel sick to your stomach (nausea), throw up (vomit), or have abdominal pain.   You have worsening pain not controlled with medications.  MAKE SURE YOU:  Understand these instructions.  Will watch your condition.  Will get help right away if you are not doing well or get worse.   This information is not intended to  replace advice given to you by your health care provider. Make sure you discuss any questions you have with your health care provider.   Document Released: 05/18/2005 Document Revised: 01/18/2013 Document Reviewed: 07/20/2012 Elsevier Interactive Patient Education 2016 [place] A contusion is a deep bruise. Contusions are the result of a blunt injury to tissues and muscle fibers under the skin. The injury causes bleeding under the skin. The skin overlying the contusion may turn blue,  purple, or yellow. Minor injuries will give you a painless contusion, but more severe contusions may stay painful and swollen for a few weeks.  CAUSES  This condition is usually caused by a blow, trauma, or direct force to an area of the body. SYMPTOMS  Symptoms of this condition include:  Swelling of the injured area.  Pain and tenderness in the injured area.  Discoloration. The area may have redness and then turn blue, purple, or yellow. DIAGNOSIS  This condition is diagnosed based on a physical exam and medical history. An X-ray, CT scan, or MRI may be needed to determine if there are any associated injuries, such as broken bones (fractures). TREATMENT  Specific treatment for this condition depends on what area of the body was injured. In general, the best treatment for a contusion is resting, icing, applying pressure to (compression), and elevating the injured area. This is often called the RICE strategy. Over-the-counter anti-inflammatory medicines may also be recommended for pain control.  HOME CARE INSTRUCTIONS   Rest the injured area.  If directed, apply ice to the injured area:  Put ice in a plastic bag.  Place a towel between your skin and the bag.  Leave the ice on for 20 minutes, 2-3 times per day.  If directed, apply light compression to the injured area using an elastic bandage. Make sure the bandage is not wrapped too tightly. Remove and reapply the bandage as directed by your health care provider.  If possible, raise (elevate) the injured area above the level of your heart while you are sitting or lying down.  Take over-the-counter and prescription medicines only as told by your health care provider. SEEK MEDICAL CARE IF:  Your symptoms do not improve after several days of treatment.  Your symptoms get worse.  You have difficulty moving the injured area. SEEK IMMEDIATE MEDICAL CARE IF:   You have severe pain.  You have numbness in a hand or foot.  Your  hand or foot turns pale or cold.   This information is not intended to replace advice given to you by your health care provider. Make sure you discuss any questions you have with your health care provider.   Document Released: 02/25/2005 Document Revised: 02/06/2015 Document Reviewed: 10/03/2014 Elsevier Interactive Patient Education [employer].

## 2015-06-05 ENCOUNTER — Ambulatory Visit: Payer: Medicare Other | Admitting: Internal Medicine

## 2015-06-11 ENCOUNTER — Ambulatory Visit: Payer: Medicare Other | Admitting: Internal Medicine

## 2015-06-11 ENCOUNTER — Ambulatory Visit (INDEPENDENT_AMBULATORY_CARE_PROVIDER_SITE_OTHER): Payer: Medicare Other | Admitting: Internal Medicine

## 2015-06-11 VITALS — BP 130/88 | HR 78 | Temp 98.5°F | Wt 141.8 lb

## 2015-06-11 DIAGNOSIS — F17210 Nicotine dependence, cigarettes, uncomplicated: Secondary | ICD-10-CM | POA: Diagnosis not present

## 2015-06-11 DIAGNOSIS — S8012XD Contusion of left lower leg, subsequent encounter: Secondary | ICD-10-CM

## 2015-06-11 DIAGNOSIS — S2241XD Multiple fractures of ribs, right side, subsequent encounter for fracture with routine healing: Secondary | ICD-10-CM | POA: Diagnosis present

## 2015-06-11 DIAGNOSIS — S8012XA Contusion of left lower leg, initial encounter: Secondary | ICD-10-CM | POA: Insufficient documentation

## 2015-06-11 DIAGNOSIS — S2241XS Multiple fractures of ribs, right side, sequela: Secondary | ICD-10-CM

## 2015-06-11 MED ORDER — LIDOCAINE 5 % EX OINT: 1.0000 "application " | TOPICAL_OINTMENT | CUTANEOUS | Status: DC | PRN

## 2015-06-11 MED ORDER — NAPROXEN 500 MG PO TABS: 500.0000 mg | ORAL_TABLET | Freq: Two times a day (BID) | ORAL | Status: DC

## 2015-06-11 MED ORDER — OXYCODONE HCL 5 MG PO TABS: 5.0000 mg | ORAL_TABLET | Freq: Three times a day (TID) | ORAL | Status: DC | PRN

## 2015-06-11 MED ORDER — LIDOCAINE 5 % EX PTCH: 1.0000 | MEDICATED_PATCH | CUTANEOUS | Status: DC

## 2015-06-11 NOTE — Patient Instructions (Signed)
FOR YOUR PAIN, TAKE THE FOLLOWING: -OXYCODONE 5 MG EVERY 8 HOURS AS NEEDED FOR SEVERE PAIN -LIDOCAINE PATCH OVER RIGHT RIBS OR LIDOCAINE GEL OVER RIBS -NAPROXEN 500 MG ONE PILL TWICE A DAY WITH FOOD FOR SWELLING -FOLLOW UP IN CLINIC FOR PAIN

## 2015-06-12 NOTE — Progress Notes (Signed)
Subjective:    Patient ID: Monique Henderson, female    DOB: [birth year], 65 y.o.   MRN: [id number]  HPI Monique Henderson is a 65 y.o. female with PMHx of GERD, Hepatitis C who presents to the clinic for follow up for right rib fractures. Please see A&P for the status of the patient's chronic medical problems.   Past Medical History  Diagnosis Date  . GERD (gastroesophageal reflux disease)   . Hepatitis C   . Hypertension   . Low back pain   . Uterine fibroid   . Normocytic anemia     With thrombocytosis  . Right ureteral stone 2002  . Atelectasis 2002    Bilateral  . Bone spur 2008    Right calcaneal foot spur  . Lumbar spinal stenosis     S/P lumbar decompressive laminectomy, fusion and plating for lumbar spinal stensosis  . Hot flashes   . Wears dentures     top  . Hx of radiation therapy 2005    left breast  . 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 Autumn Messing; S/P re-excision of superior and inferior margins 10/27/2012.   . Osteoarthritis   . Shortness of breath     from pain  . Hx of radiation therapy 01/11/13- 03/22/13    right breast 5760 cGy 30 sessions  . Breast cancer ([place]) 2004    Ductal carcinoma in situ of the left breast; S/P left partial mastectomy 02/26/2003; S/P re-excision of cranial and lateral margins11/18/2004.radiation  . Breast cancer ([place]) 09/21/2012    right breast/ last radiation treatment 03/22/2013    Outpatient Encounter Prescriptions as of 06/11/2015  Medication Sig  . albuterol (PROAIR HFA) 108 (90 BASE) MCG/ACT inhaler Inhale 2 puffs into the lungs every 6 (six) hours as needed for wheezing or shortness of breath.  Marland Kitchen amLODipine (NORVASC) 10 MG tablet Take 1 tablet (10 mg total) by mouth daily.  . B Complex Vitamins (VITAMIN B COMPLEX PO) Take 1 tablet by mouth daily.   . cetirizine (ZYRTEC) 10 MG tablet TAKE ONE TABLET BY MOUTH ONCE DAILY  . cloNIDine (CATAPRES) 0.1 MG tablet Take  1 tablet (0.1 mg total) by mouth 2 (two) times daily.  . famotidine (PEPCID) 20 MG tablet Take 1 tablet (20 mg total) by mouth at bedtime.  . gabapentin (NEURONTIN) 300 MG capsule Take 1 capsule (300 mg total) by mouth 3 (three) times daily.  Marland Kitchen lidocaine (LIDODERM) 5 % Place 1 patch onto the skin daily. Remove & Discard patch within 12 hours or as directed by MD  . lidocaine (XYLOCAINE) 5 % ointment Apply 1 application topically as needed.  Marland Kitchen losartan (COZAAR) 25 MG tablet Take 2 tablets (50 mg total) by mouth daily.  . naproxen (NAPROSYN) 500 MG tablet Take 1 tablet (500 mg total) by mouth 2 (two) times daily with a meal.  . ondansetron (ZOFRAN) 4 MG tablet Take 1 tablet (4 mg total) by mouth every 6 (six) hours.  Marland Kitchen oxyCODONE (ROXICODONE) 5 MG immediate release tablet Take 1 tablet (5 mg total) by mouth every 8 (eight) hours as needed for severe pain.  . traMADol (ULTRAM) 50 MG tablet Take 1 tablet (50 mg total) by mouth every 6 (six) hours as needed.  . triamcinolone cream (KENALOG) 0.1 % Apply 1 application topically 2 (two) times daily. To affected area  . Vitamin D, Ergocalciferol, (DRISDOL) 50000 UNITS CAPS capsule Take 1  capsule (50,000 Units total) by mouth every 7 (seven) days.  Marland Kitchen zolpidem (AMBIEN) 5 MG tablet Take 1 tablet (5 mg total) by mouth at bedtime as needed for sleep.  . [DISCONTINUED] oxyCODONE (ROXICODONE) 5 MG immediate release tablet Take 1 tablet (5 mg total) by mouth every 6 (six) hours as needed for severe pain.  . [DISCONTINUED] oxyCODONE-acetaminophen (PERCOCET/ROXICET) 5-325 MG per tablet Take 1 tablet by mouth every 6 (six) hours as needed for moderate pain.    No facility-administered encounter medications on file as of 06/11/2015.    Family History  Problem Relation Age of Onset  . Colon cancer Mother 56  . Hypertension Mother   . Diabetes Sister 45  . Hypertension Sister   . Breast cancer Neg Hx   . Cervical cancer Neg Hx   . Diabetes Brother   .  Hypertension Brother   . Diabetes Brother   . Hypertension Brother   . Kidney disease Son     On dialysis  . Hypertension Son   . Diabetes Son   . Multiple sclerosis Son   . Bone cancer Sister 46    Social History   Social History  . Marital Status: Single    Spouse Name: N/A  . Number of Children: N/A  . Years of Education: N/A   Occupational History  . Not on file.   Social History Main Topics  . Smoking status: Current Some Day Smoker -- 0.25 packs/day for 44 years    Types: Cigarettes  . Smokeless tobacco: Never Used     Comment: smoking less  . Alcohol Use: 1.2 oz/week    2 Cans of beer per week     Comment:  "couple beers q weekend"  . Drug Use: Yes    Special: Cocaine     Comment: "stopped back in the 1980's"  . Sexual Activity: Yes    Birth Control/ Protection: Post-menopausal   Other Topics Concern  . Not on file   Social History Narrative    Review of Systems General: Denies fever, chills.  Respiratory: Denies SOB, cough, chest tightness, and wheezing.   Cardiovascular: Denies left sided chest pain and palpitations.  Gastrointestinal: Denies nausea, vomiting, abdominal pain Musculoskeletal: Admits to right sided rib pain and left lower extremity pain. Admits to left lower extremity bruising, swelling and difficult ambulation.  Skin: Admits to bruising. Denies wounds.  Neurological: Denies dizziness, headaches, weakness, lightheadedness     Objective:   Physical Exam Filed Vitals:   06/11/15 1458  BP: 130/88  Pulse: 78  Temp: 98.5 F (36.9 C)  TempSrc: Oral  Weight: 141 lb 12.8 oz (64.32 kg)  SpO2: 99%   General: Vital signs reviewed.  Patient is well-developed and well-nourished, in no acute distress and cooperative with exam.  Cardiovascular: RRR, S1 normal, S2 normal. Pulmonary/Chest: Clear to auscultation bilaterally, no wheezes, rales, or rhonchi. Abdominal: Soft, non-tender, non-distended  Musculoskeletal: Right sided chest pain with  deep inspiration, no evidence of ecchymosis, tender on palpation of right lateral ribs 9-11. Edema noted proximal to left knee, but without knee is without obvious effusion. Normal ROM of left knee, but pain with movement and on palpation of left thigh and knee.  Extremities: No lower extremity edema bilaterally, pulses symmetric and intact bilaterally.  Skin: Ecchymoses noted on left lateral thigh and left medial distal thigh.  Psychiatric: Normal mood and affect. Speech and behavior is normal. Cognition and memory are normal.   Unilateral R Rib Xray and Left Femur  Xray were independently reviewed by myself and Dr. Lynnae January.    Assessment & Plan:   Please see problem based assessment and plan.

## 2015-06-12 NOTE — Progress Notes (Signed)
Internal Medicine Clinic Attending  Case discussed with Dr. Richardson at the time of the visit.  We reviewed the resident's history and exam and pertinent patient test results.  I agree with the assessment, diagnosis, and plan of care documented in the resident's note. 
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
## 2015-06-14 ENCOUNTER — Other Ambulatory Visit: Payer: Self-pay | Admitting: *Deleted

## 2015-06-14 DIAGNOSIS — G4700 Insomnia, unspecified: Secondary | ICD-10-CM

## 2015-06-14 NOTE — Telephone Encounter (Signed)
Last appt was 06/11/15.

## 2015-06-18 ENCOUNTER — Other Ambulatory Visit: Payer: Self-pay | Admitting: Student in an Organized Health Care Education/Training Program

## 2015-06-18 ENCOUNTER — Telehealth: Payer: Self-pay | Admitting: Student in an Organized Health Care Education/Training Program

## 2015-06-18 DIAGNOSIS — S2241XS Multiple fractures of ribs, right side, sequela: Secondary | ICD-10-CM

## 2015-06-18 NOTE — Telephone Encounter (Signed)
Pending under another encounter.  Will call pt to advise clinic policy 48 hours.

## 2015-06-18 NOTE — Telephone Encounter (Signed)
Patient called requesting a refill on her oxycodone. 

## 2015-06-18 NOTE — Telephone Encounter (Signed)
Pt given #21 on 1/10 by Dr. Marvel Plan- she comes back to you on 1/23 for follow-up

## 2015-06-18 NOTE — Telephone Encounter (Signed)
Pending under a dif encounter from Friday

## 2015-06-18 NOTE — Telephone Encounter (Signed)
Pt requesting oxycodone to be filled. °

## 2015-06-18 NOTE — Telephone Encounter (Signed)
Pt requesting Ambien to be filled @ [facility].

## 2015-06-19 MED ORDER — OXYCODONE HCL 5 MG PO TABS: 5.0000 mg | ORAL_TABLET | Freq: Three times a day (TID) | ORAL | Status: DC | PRN
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
## 2015-06-19 NOTE — Telephone Encounter (Signed)
I am going to defer this ambien refill for now. She only uses it intermittently, and we have just refilled oxycodone for acute pain. I would prefer not to mix these medications. She has an appointment with me in five days and we can discuss her pain and insomnia then.

## 2015-06-19 NOTE — Telephone Encounter (Signed)
Pt informed about Ambien and to keep appt on Monday.

## 2015-06-19 NOTE — Telephone Encounter (Signed)
Called this am, lm for rtc

## 2015-06-20 ENCOUNTER — Telehealth: Payer: Self-pay | Admitting: Student in an Organized Health Care Education/Training Program

## 2015-06-20 NOTE — Telephone Encounter (Signed)
informed

## 2015-06-20 NOTE — Telephone Encounter (Signed)
Pt want to know if pain med Rx is ready for pick up.

## 2015-06-24 ENCOUNTER — Ambulatory Visit (INDEPENDENT_AMBULATORY_CARE_PROVIDER_SITE_OTHER): Payer: Medicare Other | Admitting: Student in an Organized Health Care Education/Training Program

## 2015-06-24 VITALS — BP 153/96 | HR 76 | Temp 98.2°F | Ht 63.0 in | Wt 141.4 lb

## 2015-06-24 DIAGNOSIS — S2241XD Multiple fractures of ribs, right side, subsequent encounter for fracture with routine healing: Secondary | ICD-10-CM

## 2015-06-24 DIAGNOSIS — G4700 Insomnia, unspecified: Secondary | ICD-10-CM | POA: Diagnosis not present

## 2015-06-24 DIAGNOSIS — Z0000 Encounter for general adult medical examination without abnormal findings: Secondary | ICD-10-CM

## 2015-06-24 DIAGNOSIS — I129 Hypertensive chronic kidney disease with stage 1 through stage 4 chronic kidney disease, or unspecified chronic kidney disease: Secondary | ICD-10-CM

## 2015-06-24 DIAGNOSIS — I10 Essential (primary) hypertension: Secondary | ICD-10-CM

## 2015-06-24 DIAGNOSIS — S2241XS Multiple fractures of ribs, right side, sequela: Secondary | ICD-10-CM

## 2015-06-24 DIAGNOSIS — N183 Chronic kidney disease, stage 3 (moderate): Secondary | ICD-10-CM | POA: Diagnosis not present

## 2015-06-24 MED ORDER — OXYCODONE HCL 10 MG PO TABS: 10.0000 mg | ORAL_TABLET | Freq: Three times a day (TID) | ORAL | Status: DC | PRN

## 2015-06-24 MED ORDER — ZOLPIDEM TARTRATE 5 MG PO TABS: 5.0000 mg | ORAL_TABLET | Freq: Every evening | ORAL | Status: DC | PRN
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
## 2015-06-24 NOTE — Assessment & Plan Note (Signed)
She has intermittent insomnia for which she has been getting zolpidem #30, about 2-3 scripts per year, last one in October. I provided her with another refill of #30 tablets, but cautioned to not take ambien with oxycodone. She was agreeable.
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
## 2015-06-24 NOTE — Assessment & Plan Note (Signed)
In need of cervical cancer screening. Last negative pap was 2012. She prefers to go to gynecology at [facility] hospital. I have placed a referral.

## 2015-06-24 NOTE — Assessment & Plan Note (Signed)
BP mildly over goal in clinic, she reports appropriate readings at home. I am going to defer making any changes now because of her acute pain generator and the opiate changes we are making. Continue amlodipine 10, clonidine 0.1 bid, and losartan 50 for now.

## 2015-07-08 ENCOUNTER — Other Ambulatory Visit: Payer: Self-pay | Admitting: Student in an Organized Health Care Education/Training Program
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
## 2015-07-08 NOTE — Telephone Encounter (Signed)
Pt requesting oxycodone to be filled. °

## 2015-07-08 NOTE — Telephone Encounter (Signed)
Last filled at office visit 1/23 Next appt is to be 6 months from 1/23 visit Last uds 2015

## 2015-07-10 NOTE — Telephone Encounter (Signed)
Pt called back r/t message left for rtc. She is not happy about not getting narc, she is angry Scheduled appt fri am w/ dr Denton Brick

## 2015-07-12 ENCOUNTER — Encounter: Payer: Self-pay | Admitting: Student in an Organized Health Care Education/Training Program

## 2015-07-12 ENCOUNTER — Ambulatory Visit: Payer: Medicare Other | Admitting: Internal Medicine

## 2015-07-26 ENCOUNTER — Encounter: Payer: Medicare Other | Admitting: Obstetrics and Gynecology

## 2015-08-26 ENCOUNTER — Encounter: Payer: Medicare Other | Admitting: Obstetrics & Gynecology

## 2015-09-02 NOTE — Addendum Note (Signed)
Addended by: Hulan Fray on: 09/02/2015 07:16 PM   Modules accepted: Orders

## 2015-09-16 ENCOUNTER — Other Ambulatory Visit: Payer: Self-pay | Admitting: Student in an Organized Health Care Education/Training Program

## 2015-09-18 ENCOUNTER — Other Ambulatory Visit: Payer: Self-pay | Admitting: *Deleted

## 2015-09-18 ENCOUNTER — Other Ambulatory Visit: Payer: Self-pay

## 2015-09-18 DIAGNOSIS — G4700 Insomnia, unspecified: Secondary | ICD-10-CM

## 2015-09-18 MED ORDER — TRAMADOL HCL 50 MG PO TABS: 50.0000 mg | ORAL_TABLET | Freq: Four times a day (QID) | ORAL | Status: DC | PRN

## 2015-09-18 MED ORDER — ZOLPIDEM TARTRATE 5 MG PO TABS: 5.0000 mg | ORAL_TABLET | Freq: Every evening | ORAL | Status: DC | PRN

## 2015-09-18 NOTE — Telephone Encounter (Signed)
Pt last seen on 1/23 and looks like Tramadol was discontinued. Please check. Thanks

## 2015-09-18 NOTE — Telephone Encounter (Signed)
Called pt back, she is upset, states she needs her tramadol and ambien, states tramadol only takes the edge off her pain

## 2015-09-18 NOTE — Telephone Encounter (Signed)
Pt states she desires to have a script of tramadol, last filled 07/2015 from script written 01/2015 Her next visit per your last note is to be appr 11/2015 Please advise

## 2015-09-18 NOTE — Telephone Encounter (Signed)
Called to pharm 

## 2015-09-18 NOTE — Telephone Encounter (Signed)
Please call pt back.

## 2015-09-18 NOTE — Telephone Encounter (Signed)
Called to pharm, informed pt

## 2015-09-30 ENCOUNTER — Telehealth: Payer: Self-pay

## 2015-09-30 ENCOUNTER — Other Ambulatory Visit: Payer: Self-pay

## 2015-09-30 DIAGNOSIS — Z981 Arthrodesis status: Secondary | ICD-10-CM

## 2015-09-30 MED ORDER — GABAPENTIN 600 MG PO TABS: 600.0000 mg | ORAL_TABLET | Freq: Two times a day (BID) | ORAL | Status: DC

## 2015-09-30 NOTE — Telephone Encounter (Signed)
Pt requesting gabapentin to be filled. °

## 2015-10-04 ENCOUNTER — Telehealth: Payer: Self-pay | Admitting: *Deleted

## 2015-10-04 NOTE — Telephone Encounter (Signed)
yes

## 2015-10-04 NOTE — Telephone Encounter (Signed)
Pharm called and wanted to give pt 300mg  capsules instead of 600mg  capsules, pt was agreeable and cost would not change, verbal approval given,  do you agree?

## 2015-10-14 ENCOUNTER — Other Ambulatory Visit: Payer: Self-pay | Admitting: Internal Medicine

## 2015-11-19 ENCOUNTER — Other Ambulatory Visit: Payer: Self-pay | Admitting: Student in an Organized Health Care Education/Training Program

## 2015-11-19 MED ORDER — CLONIDINE HCL 0.1 MG PO TABS: 0.1000 mg | ORAL_TABLET | Freq: Two times a day (BID) | ORAL | Status: DC

## 2015-11-19 MED ORDER — TRAMADOL HCL 50 MG PO TABS: 50.0000 mg | ORAL_TABLET | Freq: Two times a day (BID) | ORAL | Status: DC | PRN

## 2015-11-19 NOTE — Telephone Encounter (Signed)
cloNIDine (CATAPRES) 0.1 MG tablet traMADol (ULTRAM) 50 MG tablet  [facility] [place]
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
## 2015-11-19 NOTE — Telephone Encounter (Signed)
Called to pharm 

## 2015-11-27 ENCOUNTER — Other Ambulatory Visit: Payer: Self-pay | Admitting: General Surgery

## 2015-11-27 DIAGNOSIS — Z853 Personal history of malignant neoplasm of breast: Secondary | ICD-10-CM

## 2015-11-27 DIAGNOSIS — Z9889 Other specified postprocedural states: Secondary | ICD-10-CM

## 2015-12-02 ENCOUNTER — Other Ambulatory Visit: Payer: Self-pay

## 2015-12-02 ENCOUNTER — Other Ambulatory Visit: Payer: Self-pay | Admitting: General Surgery

## 2015-12-02 DIAGNOSIS — Z853 Personal history of malignant neoplasm of breast: Secondary | ICD-10-CM

## 2015-12-02 DIAGNOSIS — Z9889 Other specified postprocedural states: Secondary | ICD-10-CM

## 2015-12-09 ENCOUNTER — Encounter: Payer: Self-pay | Admitting: Student in an Organized Health Care Education/Training Program

## 2015-12-09 ENCOUNTER — Ambulatory Visit: Payer: Medicare Other | Admitting: Student in an Organized Health Care Education/Training Program

## 2015-12-12 ENCOUNTER — Other Ambulatory Visit: Payer: Self-pay | Admitting: Internal Medicine

## 2015-12-23 ENCOUNTER — Encounter: Payer: Medicare Other | Admitting: Student in an Organized Health Care Education/Training Program

## 2015-12-24 DIAGNOSIS — D0511 Intraductal carcinoma in situ of right breast: Secondary | ICD-10-CM | POA: Diagnosis not present

## 2015-12-25 ENCOUNTER — Other Ambulatory Visit: Payer: Self-pay | Admitting: *Deleted

## 2015-12-25 DIAGNOSIS — G4700 Insomnia, unspecified: Secondary | ICD-10-CM

## 2015-12-26 ENCOUNTER — Ambulatory Visit
Admission: RE | Admit: 2015-12-26 | Discharge: 2015-12-26 | Disposition: A | Discharge status: Home / Self Care | Payer: Medicare Other | Source: Ambulatory Visit | Attending: General Surgery | Admitting: General Surgery

## 2015-12-26 DIAGNOSIS — R928 Other abnormal and inconclusive findings on diagnostic imaging of breast: Secondary | ICD-10-CM | POA: Diagnosis not present

## 2015-12-26 DIAGNOSIS — Z853 Personal history of malignant neoplasm of breast: Secondary | ICD-10-CM

## 2015-12-26 DIAGNOSIS — Z9889 Other specified postprocedural states: Secondary | ICD-10-CM

## 2015-12-26 MED ORDER — ZOLPIDEM TARTRATE 5 MG PO TABS: 5.0000 mg | ORAL_TABLET | Freq: Every evening | ORAL | 0 refills | Status: DC | PRN

## 2015-12-26 NOTE — Telephone Encounter (Signed)
Intermittent insomnia, uses zolpidem when needed, #30 tabs generally lasts her three months. Last rx was in April. I am going to approve this refill for #30 with no refills. She has an appt to see me 8/21.

## 2015-12-30 ENCOUNTER — Encounter: Payer: Medicare Other | Admitting: Student in an Organized Health Care Education/Training Program

## 2015-12-30 NOTE — Telephone Encounter (Signed)
Zolpidem rx called to [facility]

## 2015-12-31 ENCOUNTER — Other Ambulatory Visit: Payer: Self-pay | Admitting: Internal Medicine

## 2016-01-02 ENCOUNTER — Other Ambulatory Visit: Payer: Self-pay | Admitting: Internal Medicine

## 2016-01-03 NOTE — Telephone Encounter (Signed)
Last appt 06/24/15.   Next appt 01/20/16.

## 2016-01-08 ENCOUNTER — Other Ambulatory Visit: Payer: Self-pay | Admitting: Student in an Organized Health Care Education/Training Program

## 2016-01-08 NOTE — Telephone Encounter (Signed)
Last visit 1/23 Next visit 8/21 Last script 11/19/2015

## 2016-01-17 ENCOUNTER — Telehealth: Payer: Self-pay | Admitting: Student in an Organized Health Care Education/Training Program

## 2016-01-17 NOTE — Telephone Encounter (Signed)
APT. REMINDER CALL, LMTCB °

## 2016-01-20 ENCOUNTER — Ambulatory Visit (INDEPENDENT_AMBULATORY_CARE_PROVIDER_SITE_OTHER): Payer: Commercial Managed Care - HMO | Admitting: Student in an Organized Health Care Education/Training Program

## 2016-01-20 ENCOUNTER — Encounter: Payer: Self-pay | Admitting: Student in an Organized Health Care Education/Training Program

## 2016-01-20 VITALS — BP 170/102 | HR 71 | Temp 98.3°F | Ht 63.0 in | Wt 136.7 lb

## 2016-01-20 DIAGNOSIS — I10 Essential (primary) hypertension: Secondary | ICD-10-CM

## 2016-01-20 DIAGNOSIS — Z981 Arthrodesis status: Secondary | ICD-10-CM

## 2016-01-20 DIAGNOSIS — N183 Chronic kidney disease, stage 3 unspecified: Secondary | ICD-10-CM

## 2016-01-20 DIAGNOSIS — B182 Chronic viral hepatitis C: Secondary | ICD-10-CM | POA: Diagnosis not present

## 2016-01-20 DIAGNOSIS — Z79899 Other long term (current) drug therapy: Secondary | ICD-10-CM

## 2016-01-20 DIAGNOSIS — G4700 Insomnia, unspecified: Secondary | ICD-10-CM | POA: Diagnosis not present

## 2016-01-20 DIAGNOSIS — F17210 Nicotine dependence, cigarettes, uncomplicated: Secondary | ICD-10-CM

## 2016-01-20 MED ORDER — TRAMADOL HCL 50 MG PO TABS: 50.0000 mg | ORAL_TABLET | Freq: Two times a day (BID) | ORAL | 0 refills | Status: DC | PRN

## 2016-01-20 NOTE — Patient Instructions (Signed)
1. Please take your blood pressure medicine as prescribed. Come back in 2 months and take your medicines on the day of the appointment.   2. Take Tramadol as needed on bad days. #60 tablets should last you two months until your next visit. I only refill this medicine within a visit.

## 2016-01-20 NOTE — Assessment & Plan Note (Signed)
Chronic back pain is her focus at this point. She complains pain is severe and limiting her functional status. She has been using tramadol for the last six months; it is unclear how much they are helping. I tried to reset her expectations: nothing can take the pain completely away, to refocus on functional goals instead. I think there is some risk here of medication misuse. She admits to taking an occasional oxycontin from her sister when the pain is really bad. I reviewed the controlled substance database which only shows [facility] refills. We do not have a stable prescribing schedule yet because she follows up with me so infrequently. I gave her a refill of tramadol 50mg  #60 with the expectation that it would last 2 months. I advised not taking it every day to combat tolerance. I told her to follow up with me in a visit within 2 months for a refill, no more phone refills if we can help it. Because of inconsistent follow up and high risk statements, I am not inclined to offer any stronger narcotics in the future.
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
## 2016-01-20 NOTE — Assessment & Plan Note (Addendum)
The pressure is elevated today. She reports she didn't take any of her medications this morning. Says she was too rushed getting out of the house. I'm going to continue amlodipine, losartan, and clonidine. I urged her to take these medications daily, especially when coming to the doctor so I can assess her blood pressure control. I advised that we get renal function labs today, however the patient declined, she did not want to get labs drawn.

## 2016-01-20 NOTE — Assessment & Plan Note (Signed)
This is a mild intermittent problem. I wrote her a prescription for zolpidem in January and she said she's only used a few tabs. I advised she continue using this medicine only as needed

## 2016-01-20 NOTE — Assessment & Plan Note (Signed)
This was been identified as early as 2014 and she has not yet followed up with the ID clinic for treatment. Referrals been placed several times, she just doesn't seem very interested in going. The hope that I can build a little better relationship with her this year and maybe have her follow-up soon before we start to see evidence of cirrhosis.

## 2016-02-10 ENCOUNTER — Other Ambulatory Visit: Payer: Self-pay | Admitting: Student in an Organized Health Care Education/Training Program

## 2016-02-10 NOTE — Telephone Encounter (Signed)
Message sent to front office to schedule pt an appt. 

## 2016-02-10 NOTE — Telephone Encounter (Signed)
Tramadol last refilled 8/20 with expectation that would last two months. It is too early for a refill. Please ask her to schedule a follow up with me in early October. She can be added on to my schedule if I am full.

## 2016-02-17 ENCOUNTER — Other Ambulatory Visit: Payer: Self-pay | Admitting: Student in an Organized Health Care Education/Training Program

## 2016-02-17 NOTE — Telephone Encounter (Signed)
Just noticed Dr. Autumn Patty note to have patient come in instead of refilling, however, Dr. Evette Doffing is on vacation for 2 weeks at this time.    I will plan to refill the tramadol at #30 only.  This refill should last her one month total.  If she calls for early refill, have her come in to the clinic in an overbook to Dr. Autumn Patty clinic for further discussion. She is not appropriate for resident clinic to resolve this issue as she has repeatedly disregarded Dr. Autumn Patty recommendations for her pain medication and has some yellow flags regarding their use.    Please refill the prescription of Tramadol for #30 and advise patient that there will be no further refills until she is seen by Dr. Evette Doffing.   Thanks!

## 2016-02-17 NOTE — Telephone Encounter (Signed)
cloNIDine (CATAPRES) 0.1 MG tablet

## 2016-02-17 NOTE — Telephone Encounter (Signed)
Called pt - informed last Tramadol refill was expected to last 2 months per Dr Heber [place]. Pt stated gabapentin doesn't help and sometimes she has to take more Tramadol. Next appt w/Dr Evette Doffing 03/16/16.

## 2016-02-17 NOTE — Telephone Encounter (Signed)
Per Dr Vincent's last note, he reviewed with the patient that the Rx of Tramadol was expected to last 2 months and to NOT have further telephone refills if possible.  Is there a reason that the medication did not last 2 months?

## 2016-02-17 NOTE — Telephone Encounter (Signed)
Pt called / informed will refill Tramadol for qty of 30 only; no further over the telephone refills per Dr Daryll Drown. States she understand. And informed to keep appt w/ Dr Evette Doffing in Oct. Stated she will.

## 2016-02-17 NOTE — Telephone Encounter (Signed)
As Dr. Evette Doffing is on vacation, will address this issue today.  Per Dr. Autumn Patty note she should be taking no more than one tablet a day.  #60 tablets were supposed to last her 2 months.  She had a very inconsistent history of taking the medication based on the notes.  She also reported taking a family members medications.  I agree with Dr. Evette Doffing that she should not be offered higher dose narcotics.    At this point, will given refill for Tramadol 50mg  #30.  This Rx should last until her appointment with Dr. Evette Doffing on 03/16/16, no exceptions.  No further refills over the phone.  Please let the patient know this and I will document in a note as well.

## 2016-02-18 NOTE — Telephone Encounter (Signed)
Rx phoned into pharmacy .Monique Henderson, Monique Abello Cassady9/19/201711:26 AM

## 2016-03-04 ENCOUNTER — Other Ambulatory Visit: Payer: Self-pay | Admitting: Student in an Organized Health Care Education/Training Program

## 2016-03-06 ENCOUNTER — Other Ambulatory Visit: Payer: Self-pay | Admitting: Student in an Organized Health Care Education/Training Program

## 2016-03-06 ENCOUNTER — Other Ambulatory Visit: Payer: Self-pay | Admitting: Internal Medicine

## 2016-03-12 ENCOUNTER — Other Ambulatory Visit: Payer: Self-pay | Admitting: Internal Medicine

## 2016-03-12 ENCOUNTER — Other Ambulatory Visit: Payer: Self-pay | Admitting: Student in an Organized Health Care Education/Training Program

## 2016-03-12 DIAGNOSIS — G4700 Insomnia, unspecified: Secondary | ICD-10-CM

## 2016-03-13 ENCOUNTER — Telehealth: Payer: Self-pay | Admitting: Student in an Organized Health Care Education/Training Program

## 2016-03-13 NOTE — Telephone Encounter (Signed)
Patient has an appointment with me on Monday (three days). I am going to defer this ambien refill until then when I can better evaluate her.

## 2016-03-13 NOTE — Telephone Encounter (Signed)
APT. REMINDER CALL, LMTCB °

## 2016-03-16 ENCOUNTER — Encounter: Payer: Medicaid Other | Admitting: Student in an Organized Health Care Education/Training Program

## 2016-03-31 ENCOUNTER — Other Ambulatory Visit: Payer: Self-pay | Admitting: Student in an Organized Health Care Education/Training Program

## 2016-03-31 DIAGNOSIS — G4700 Insomnia, unspecified: Secondary | ICD-10-CM

## 2016-03-31 NOTE — Telephone Encounter (Signed)
Original refill request came in on 10/12, but was denied on 10/13 as pcp was to address refill with pt on her 10/16 visit with him,  but she canceled the appt.  She now has an appt scheduled for 11/13 w/pcp.  Will forward info to pcp for review and ambien refill if appropriate.  Please advise.Monique Hidden Cassady10/31/20173:24 PM

## 2016-03-31 NOTE — Telephone Encounter (Signed)
I am going to decline this refill again and insist on refilling this only in our upcoming visit in November.

## 2016-04-02 NOTE — Telephone Encounter (Signed)
Attempted to contact patient and make her aware, no answer, but was unable to leave message on recorder.Regenia Skeeter, Darlene Cassady11/2/201710:10 AM

## 2016-04-10 ENCOUNTER — Telehealth: Payer: Self-pay | Admitting: Student in an Organized Health Care Education/Training Program

## 2016-04-10 NOTE — Telephone Encounter (Signed)
APT. REMINDER CALL, LMTCB °

## 2016-04-13 ENCOUNTER — Encounter: Payer: Medicaid Other | Admitting: Student in an Organized Health Care Education/Training Program

## 2016-04-13 ENCOUNTER — Telehealth: Payer: Self-pay | Admitting: Student in an Organized Health Care Education/Training Program

## 2016-04-13 NOTE — Telephone Encounter (Signed)
Today was a second no-show appointment with me in the last two months. She has chronic pain and has been asking for escalating pain medications. In August I gave her Tramadol #60 with the expectation to last two months. She called for a refill in mid September; my partner refilled #30 while I was out of town. Somehow she got a second refill for #30 at the end of October according to controlled database, which I do not see authorized in [place]. At this point, [facility] should decline all Tramadol refill requests until she comes for an office visit with me. She has declined blood work (has CKD 3) and a tox screen (consistently positive for cocaine in the past) too many times this year.

## 2016-05-04 ENCOUNTER — Encounter: Payer: Medicaid Other | Admitting: Student in an Organized Health Care Education/Training Program

## 2016-05-04 ENCOUNTER — Encounter: Payer: Self-pay | Admitting: Student in an Organized Health Care Education/Training Program

## 2016-05-08 ENCOUNTER — Other Ambulatory Visit: Payer: Self-pay | Admitting: Internal Medicine

## 2016-05-08 ENCOUNTER — Encounter: Payer: Self-pay | Admitting: Internal Medicine

## 2016-05-08 NOTE — Telephone Encounter (Signed)
Received refill request for tramadol, MD instructions per epic to hold refill until patient is seen. Patient stated will call back to schedule an appt.

## 2016-05-08 NOTE — Telephone Encounter (Signed)
Monique Henderson has missed three appointments with me since October. Her case is being reviewed by Tamela Oddi, may need warning letter or dismissal. She can reschedule an appointment with me in the meantime.

## 2016-05-20 ENCOUNTER — Other Ambulatory Visit: Payer: Self-pay | Admitting: Student in an Organized Health Care Education/Training Program

## 2016-05-27 ENCOUNTER — Ambulatory Visit
Admission: RE | Admit: 2016-05-27 | Discharge: 2016-05-27 | Disposition: A | Discharge status: Home / Self Care | Payer: Medicaid Other | Source: Ambulatory Visit | Attending: Internal Medicine | Admitting: Internal Medicine

## 2016-05-27 ENCOUNTER — Other Ambulatory Visit: Payer: Self-pay | Admitting: Internal Medicine

## 2016-05-27 DIAGNOSIS — M25552 Pain in left hip: Secondary | ICD-10-CM

## 2016-05-27 DIAGNOSIS — S79912A Unspecified injury of left hip, initial encounter: Secondary | ICD-10-CM | POA: Diagnosis not present

## 2016-06-12 DIAGNOSIS — I10 Essential (primary) hypertension: Secondary | ICD-10-CM | POA: Diagnosis not present

## 2016-06-12 DIAGNOSIS — R509 Fever, unspecified: Secondary | ICD-10-CM | POA: Diagnosis not present

## 2016-06-12 DIAGNOSIS — M5417 Radiculopathy, lumbosacral region: Secondary | ICD-10-CM | POA: Diagnosis not present

## 2016-06-16 DIAGNOSIS — N183 Chronic kidney disease, stage 3 (moderate): Secondary | ICD-10-CM | POA: Diagnosis not present

## 2016-06-16 DIAGNOSIS — M545 Low back pain: Secondary | ICD-10-CM | POA: Diagnosis not present

## 2016-06-16 DIAGNOSIS — R8290 Unspecified abnormal findings in urine: Secondary | ICD-10-CM | POA: Diagnosis not present

## 2016-06-16 DIAGNOSIS — I10 Essential (primary) hypertension: Secondary | ICD-10-CM | POA: Diagnosis not present

## 2016-06-16 DIAGNOSIS — Z720 Tobacco use: Secondary | ICD-10-CM | POA: Diagnosis not present

## 2016-06-22 DIAGNOSIS — G8929 Other chronic pain: Secondary | ICD-10-CM | POA: Insufficient documentation

## 2016-06-22 DIAGNOSIS — M545 Low back pain, unspecified: Secondary | ICD-10-CM

## 2016-06-22 HISTORY — DX: Low back pain, unspecified: M54.50

## 2016-06-24 ENCOUNTER — Other Ambulatory Visit: Payer: Self-pay | Admitting: Student in an Organized Health Care Education/Training Program

## 2016-06-24 DIAGNOSIS — G4700 Insomnia, unspecified: Secondary | ICD-10-CM

## 2016-07-07 DIAGNOSIS — N183 Chronic kidney disease, stage 3 (moderate): Secondary | ICD-10-CM | POA: Diagnosis not present

## 2016-07-07 DIAGNOSIS — G4700 Insomnia, unspecified: Secondary | ICD-10-CM | POA: Diagnosis not present

## 2016-07-07 DIAGNOSIS — M5417 Radiculopathy, lumbosacral region: Secondary | ICD-10-CM | POA: Diagnosis not present

## 2016-07-07 DIAGNOSIS — I10 Essential (primary) hypertension: Secondary | ICD-10-CM | POA: Diagnosis not present

## 2016-07-08 ENCOUNTER — Other Ambulatory Visit: Payer: Self-pay | Admitting: Internal Medicine

## 2016-07-08 DIAGNOSIS — M545 Low back pain, unspecified: Secondary | ICD-10-CM

## 2016-07-08 DIAGNOSIS — R200 Anesthesia of skin: Secondary | ICD-10-CM

## 2016-07-08 DIAGNOSIS — R29898 Other symptoms and signs involving the musculoskeletal system: Secondary | ICD-10-CM

## 2016-07-14 ENCOUNTER — Other Ambulatory Visit: Payer: Self-pay | Admitting: Student in an Organized Health Care Education/Training Program

## 2016-07-14 DIAGNOSIS — I10 Essential (primary) hypertension: Secondary | ICD-10-CM | POA: Diagnosis not present

## 2016-07-14 DIAGNOSIS — M5417 Radiculopathy, lumbosacral region: Secondary | ICD-10-CM | POA: Diagnosis not present

## 2016-07-16 ENCOUNTER — Telehealth: Payer: Self-pay | Admitting: Student in an Organized Health Care Education/Training Program

## 2016-07-16 ENCOUNTER — Inpatient Hospital Stay
Admission: RE | Admit: 2016-07-16 | Discharge: 2016-07-16 | Disposition: A | Discharge status: Home / Self Care | Payer: Medicaid Other | Source: Ambulatory Visit | Attending: Internal Medicine | Admitting: Internal Medicine

## 2016-07-16 NOTE — Telephone Encounter (Addendum)
Receipt of certified letter for dismissal from the clinic was received and signed.  Forwarding to Dr. Lynnae January.

## 2016-07-24 ENCOUNTER — Ambulatory Visit
Admission: RE | Admit: 2016-07-24 | Discharge: 2016-07-24 | Disposition: A | Discharge status: Home / Self Care | Payer: Medicaid Other | Source: Ambulatory Visit | Attending: Internal Medicine | Admitting: Internal Medicine

## 2016-07-24 DIAGNOSIS — R200 Anesthesia of skin: Secondary | ICD-10-CM

## 2016-07-24 DIAGNOSIS — N183 Chronic kidney disease, stage 3 (moderate): Secondary | ICD-10-CM | POA: Diagnosis not present

## 2016-07-24 DIAGNOSIS — M545 Low back pain, unspecified: Secondary | ICD-10-CM

## 2016-07-24 DIAGNOSIS — R29898 Other symptoms and signs involving the musculoskeletal system: Secondary | ICD-10-CM

## 2016-07-24 DIAGNOSIS — M48061 Spinal stenosis, lumbar region without neurogenic claudication: Secondary | ICD-10-CM | POA: Diagnosis not present

## 2016-07-24 MED ORDER — GADOBENATE DIMEGLUMINE 529 MG/ML IV SOLN
6.0000 mL | Freq: Once | INTRAVENOUS | Status: AC | PRN
  Administered 2016-07-24: 6 mL via INTRAVENOUS

## 2016-08-02 ENCOUNTER — Other Ambulatory Visit: Payer: Self-pay | Admitting: Student in an Organized Health Care Education/Training Program

## 2016-08-27 DIAGNOSIS — I10 Essential (primary) hypertension: Secondary | ICD-10-CM | POA: Diagnosis not present

## 2016-08-27 DIAGNOSIS — N183 Chronic kidney disease, stage 3 (moderate): Secondary | ICD-10-CM | POA: Diagnosis not present

## 2016-08-27 DIAGNOSIS — M545 Low back pain: Secondary | ICD-10-CM | POA: Diagnosis not present

## 2016-08-27 DIAGNOSIS — Z720 Tobacco use: Secondary | ICD-10-CM | POA: Diagnosis not present

## 2016-09-17 DIAGNOSIS — D0511 Intraductal carcinoma in situ of right breast: Secondary | ICD-10-CM | POA: Diagnosis not present

## 2016-10-15 DIAGNOSIS — I10 Essential (primary) hypertension: Secondary | ICD-10-CM | POA: Diagnosis not present

## 2016-10-15 DIAGNOSIS — Z720 Tobacco use: Secondary | ICD-10-CM | POA: Diagnosis not present

## 2016-10-15 DIAGNOSIS — M5417 Radiculopathy, lumbosacral region: Secondary | ICD-10-CM | POA: Diagnosis not present

## 2016-10-15 DIAGNOSIS — M545 Low back pain: Secondary | ICD-10-CM | POA: Diagnosis not present

## 2016-10-28 ENCOUNTER — Encounter: Payer: Medicaid Other | Admitting: Internal Medicine

## 2016-10-28 ENCOUNTER — Telehealth: Payer: Self-pay | Admitting: *Deleted

## 2016-10-28 NOTE — Telephone Encounter (Signed)
Referring office notified via fax that patient no showed today's office visit with Dr. Linus Salmons. Myrtis Hopping CMA

## 2016-11-19 DIAGNOSIS — I10 Essential (primary) hypertension: Secondary | ICD-10-CM | POA: Diagnosis not present

## 2016-11-19 DIAGNOSIS — Z720 Tobacco use: Secondary | ICD-10-CM | POA: Diagnosis not present

## 2016-11-19 DIAGNOSIS — J449 Chronic obstructive pulmonary disease, unspecified: Secondary | ICD-10-CM | POA: Diagnosis not present

## 2016-11-19 DIAGNOSIS — M545 Low back pain: Secondary | ICD-10-CM | POA: Diagnosis not present

## 2016-11-19 DIAGNOSIS — N183 Chronic kidney disease, stage 3 (moderate): Secondary | ICD-10-CM | POA: Diagnosis not present

## 2016-11-23 ENCOUNTER — Other Ambulatory Visit: Payer: Self-pay | Admitting: Internal Medicine

## 2016-11-23 DIAGNOSIS — Z853 Personal history of malignant neoplasm of breast: Secondary | ICD-10-CM

## 2016-11-25 ENCOUNTER — Emergency Department (HOSPITAL_COMMUNITY)
Admission: EM | Admit: 2016-11-25 | Discharge: 2016-11-25 | Disposition: A | Discharge status: Home / Self Care | Payer: Medicare Other | Attending: Emergency Medicine | Admitting: Emergency Medicine

## 2016-11-25 ENCOUNTER — Encounter (HOSPITAL_COMMUNITY): Payer: Self-pay | Admitting: Emergency Medicine

## 2016-11-25 DIAGNOSIS — I129 Hypertensive chronic kidney disease with stage 1 through stage 4 chronic kidney disease, or unspecified chronic kidney disease: Secondary | ICD-10-CM | POA: Insufficient documentation

## 2016-11-25 DIAGNOSIS — Z853 Personal history of malignant neoplasm of breast: Secondary | ICD-10-CM | POA: Insufficient documentation

## 2016-11-25 DIAGNOSIS — Z79899 Other long term (current) drug therapy: Secondary | ICD-10-CM | POA: Insufficient documentation

## 2016-11-25 DIAGNOSIS — F17210 Nicotine dependence, cigarettes, uncomplicated: Secondary | ICD-10-CM | POA: Diagnosis not present

## 2016-11-25 DIAGNOSIS — Z91013 Allergy to seafood: Secondary | ICD-10-CM | POA: Insufficient documentation

## 2016-11-25 DIAGNOSIS — N183 Chronic kidney disease, stage 3 (moderate): Secondary | ICD-10-CM | POA: Diagnosis not present

## 2016-11-25 DIAGNOSIS — T63441A Toxic effect of venom of bees, accidental (unintentional), initial encounter: Secondary | ICD-10-CM | POA: Insufficient documentation

## 2016-11-25 MED ORDER — DEXAMETHASONE SODIUM PHOSPHATE 10 MG/ML IJ SOLN
10.0000 mg | Freq: Once | INTRAMUSCULAR | Status: AC
  Administered 2016-11-25: 10 mg via INTRAMUSCULAR
  Filled 2016-11-25: qty 1

## 2016-11-25 NOTE — ED Triage Notes (Signed)
Pt has bee sting to right hand was localized swelling. No hives or rash, pt has no other c/o. Pt is breathing easily. No distress. Pt took benadryl last night.

## 2016-11-25 NOTE — ED Provider Notes (Signed)
[facility] DEPT Provider Note   CSN: [id number] Arrival date & time: 11/25/16  1746  By signing my name below, I, Monique Henderson, attest that this documentation has been prepared under the direction and in the presence of  [name], PA-C. Electronically Signed: Hansel Henderson, ED Scribe. 11/25/16. 7:26 PM.    History   Chief Complaint Chief Complaint  Patient presents with  . Insect Bite    HPI Monique Henderson is a 66 y.o. female who presents to the Emergency Department complaining of a moderately painful area of itching and swelling to the dorsum of the right hand that began yesterday. Pt states she was stung by a bee yesterday. She has no known allergy to bee venom. Pt states she has been taking Benadryl with no relief. She denies wheezing, facial swelling, SOB, difficulty swallowing.   The history is provided by the patient. No language interpreter was used.    Past Medical History:  Diagnosis Date  . Atelectasis 2002   Bilateral  . Bone spur 2008   Right calcaneal foot spur  . Breast cancer ([place]) 2004   Ductal carcinoma in situ of the left breast; S/P left partial mastectomy 02/26/2003; S/P re-excision of cranial and lateral margins11/18/2004.radiation  . Breast cancer ([place]) 09/21/2012   right breast/ last radiation treatment 03/22/2013  . 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 Autumn Messing; S/P re-excision of superior and inferior margins 10/27/2012.   Marland Kitchen GERD (gastroesophageal reflux disease)   . Hepatitis C   . Hot flashes   . Hx of radiation therapy 2005   left breast  . Hx of radiation therapy 01/11/13- 03/22/13   right breast 5760 cGy 30 sessions  . Hypertension   . Low back pain   . Lumbar spinal stenosis    S/P lumbar decompressive laminectomy, fusion and plating for lumbar spinal stensosis  . Normocytic anemia    With thrombocytosis  . Osteoarthritis   . Right ureteral stone 2002  . Shortness of  breath    from pain  . Uterine fibroid   . Wears dentures    top    Patient Active Problem List   Diagnosis Date Noted  . Insomnia 03/14/2015  . Health care maintenance 04/11/2014  . S/P lumbar spinal fusion 01/18/2014  . Tobacco use disorder 04/19/2009  . CKD (chronic kidney disease) stage 3, GFR 30-59 ml/min 10/10/2007  . Hepatitis C, chronic ([place]) 09/16/2006  . Essential hypertension 09/16/2006  . GERD 09/16/2006    Past Surgical History:  Procedure Laterality Date  . ANTERIOR LAT LUMBAR FUSION N/A 01/18/2014   Procedure: ANTERIOR LATERAL LUMBAR FUSION LUMBAR TWO-THREE;  Surgeon: Eustace Moore, MD;  Location: [facility];  Service: Neurosurgery;  Laterality: N/A;  . ANTERIOR LUMBAR FUSION  01/18/2014  . BACK SURGERY    . BREAST LUMPECTOMY Left 01/2003  . BREAST LUMPECTOMY Right 2014  . BREAST LUMPECTOMY WITH NEEDLE LOCALIZATION AND AXILLARY SENTINEL LYMPH NODE BX Right 10/13/2012   Procedure: BREAST LUMPECTOMY WITH NEEDLE LOCALIZATION;  Surgeon: [name], MD;  Location: [facility];  Service: General;  Laterality: Right;  Right breast wire localized lumpectomy  . LAMINECTOMY  05/27/2009   Lumbar decompressive laminectomy, fusion and plating for lumbar spinal stensosis  . LUMBAR LAMINECTOMY/DECOMPRESSION MICRODISCECTOMY Left 03/23/2013   Procedure: LUMBAR LAMINECTOMY/DECOMPRESSION MICRODISCECTOMY 1 LEVEL;  Surgeon: Eustace Moore, MD;  Location: [facility];  Service: Neurosurgery;  Laterality:  Left;  LUMBAR LAMINECTOMY/DECOMPRESSION MICRODISCECTOMY 1 LEVEL  . MASTECTOMY, PARTIAL Left 02/26/2003   ; S/P re-excision of cranial and lateral margins 04/19/2003.   Marland Kitchen RE-EXCISION OF BREAST CANCER,SUPERIOR MARGINS Right 10/27/2012   Procedure: RE-EXCISION OF BREAST CANCER,SUPERIOR and inferior MARGINS;  Surgeon: [name], MD;  Location: [facility];  Service: General;  Laterality: Right;  . RE-EXCISION OF BREAST LUMPECTOMY Left 04/2003    OB History    Gravida Para  Term Preterm AB Living   2 2 1 1        SAB TAB Ectopic Multiple Live Births                   Home Medications    Prior to Admission medications   Medication Sig Start Date End Date Taking? Authorizing Provider  amLODipine (NORVASC) 10 MG tablet TAKE ONE TABLET BY MOUTH ONCE DAILY 03/04/16   Axel Filler, MD  B Complex Vitamins (VITAMIN B COMPLEX PO) Take 1 tablet by mouth daily.     [provider]  cetirizine (ZYRTEC) 10 MG tablet TAKE ONE TABLET BY MOUTH ONCE DAILY 06/04/14   Adele Barthel D, MD  cloNIDine (CATAPRES) 0.1 MG tablet Take 1 tablet (0.1 mg total) by mouth 2 (two) times daily. 11/19/15   Axel Filler, MD  famotidine (PEPCID) 20 MG tablet TAKE ONE TABLET BY MOUTH AT BEDTIME 03/13/16   Axel Filler, MD  gabapentin (NEURONTIN) 300 MG capsule TAKE TWO CAPSULES BY MOUTH TWICE DAILY 03/06/16   Axel Filler, MD  losartan (COZAAR) 50 MG tablet Take 1 tablet (50 mg total) by mouth daily. 01/04/16   Axel Filler, MD  PROAIR HFA 108 [phone] Base) MCG/ACT inhaler INHALE TWO PUFFS INTO LUNGS EVERY 6 HOURS AS NEEDED FOR WHEEZING FOR COUGH FOR SHORTNESS OF BREATH 08/05/16   Axel Filler, MD  traMADol (ULTRAM) 50 MG tablet TAKE ONE TABLET BY MOUTH EVERY 12 HOURS AS NEEDED 02/17/16   Sid Falcon, MD  zolpidem (AMBIEN) 5 MG tablet Take 1 tablet (5 mg total) by mouth at bedtime as needed for sleep. 12/26/15   Axel Filler, MD    Family History Family History  Problem Relation Age of Onset  . Colon cancer Mother 33  . Hypertension Mother   . Diabetes Sister 61  . Hypertension Sister   . Diabetes Brother   . Hypertension Brother   . Diabetes Brother   . Hypertension Brother   . Kidney disease Son        On dialysis  . Hypertension Son   . Diabetes Son   . Multiple sclerosis Son   . Bone cancer Sister 60  . Breast cancer Neg Hx   . Cervical cancer Neg Hx     Social History Social History  Substance Use  Topics  . Smoking status: Current Some Day Smoker    Packs/day: 0.25    Years: 44.00    Types: Cigarettes  . Smokeless tobacco: Never Used     Comment: smoking less  . Alcohol use 1.2 oz/week    2 Cans of beer per week     Comment:  "couple beers q weekend"     Allergies   Shrimp [shellfish allergy]; Bactroban [mupirocin]; Vicodin [hydrocodone-acetaminophen]; Lisinopril; and Tylenol [acetaminophen]   Review of Systems Review of Systems  HENT: Negative for facial swelling and trouble swallowing.   Respiratory: Negative for shortness of breath and wheezing.   Skin: Positive for wound.  All other systems reviewed and are negative.    Physical Exam Updated Vital Signs BP (!) 149/106   Pulse 82   Temp 98.1 F (36.7 C) (Oral)   Resp 18   SpO2 99%   Physical Exam  Constitutional: She is oriented to person, place, and time. She appears well-developed and well-nourished. No distress.  HENT:  Head: Normocephalic and atraumatic.  Eyes: Conjunctivae are normal. Pupils are equal, round, and reactive to light. Right eye exhibits no discharge. Left eye exhibits no discharge. No scleral icterus.  Neck: Normal range of motion.  Cardiovascular: Normal rate and regular rhythm.  Exam reveals no gallop and no friction rub.   No murmur heard. Pulmonary/Chest: Effort normal and breath sounds normal. No respiratory distress. She has no wheezes. She has no rales. She exhibits no tenderness.  Abdominal: She exhibits no distension.  Musculoskeletal: Normal range of motion.  Neurological: She is alert and oriented to person, place, and time.  Skin: Skin is warm and dry.  Right hand is mildly swollen and red around sting site  Psychiatric: She has a normal mood and affect. Her behavior is normal.  Nursing note and vitals reviewed.    ED Treatments / Results   DIAGNOSTIC STUDIES: Oxygen Saturation is 96% on RA, adequate by my interpretation.    COORDINATION OF CARE: 7:25 PM Discussed  treatment plan with pt at bedside which includes supportive care and pt agreed to plan.    Labs (all labs ordered are listed, but only abnormal results are displayed) Labs Reviewed - No data to display  EKG  EKG Interpretation None       Radiology No results found.  Procedures Procedures (including critical care time)  Medications Ordered in ED Medications  dexamethasone (DECADRON) injection 10 mg (10 mg Intramuscular Given 11/25/16 1949)     Initial Impression / Assessment and Plan / ED Course  I have reviewed the triage vital signs and the nursing notes.  Pertinent labs & imaging results that were available during my care of the patient were reviewed by me and considered in my medical decision making (see chart for details).  66 year old female with bee sting. She is hypertensive but otherwise vitals are normal. No signs of systemic reaction. She has been taking Benadryl without relief. Will give steroid shot here and have her continue Benadryl and try cold compresses. Return precautions given.  Final Clinical Impressions(s) / ED Diagnoses   Final diagnoses:  Bee sting, accidental or unintentional, initial encounter    New Prescriptions New Prescriptions   No medications on file    I personally performed the services described in this documentation, which was scribed in my presence. The recorded information has been reviewed and is accurate.    Recardo Evangelist, PA-C 11/25/16 2111    Julianne Rice, MD 11/27/16 1650

## 2016-11-25 NOTE — Discharge Instructions (Signed)
Continue Benadryl every 6 hours Use cool compresses on the skin Return for worsening symptoms

## 2016-12-09 ENCOUNTER — Encounter: Payer: Self-pay | Admitting: Internal Medicine

## 2016-12-09 ENCOUNTER — Ambulatory Visit (INDEPENDENT_AMBULATORY_CARE_PROVIDER_SITE_OTHER): Payer: Medicare Other | Admitting: Internal Medicine

## 2016-12-09 VITALS — BP 150/93 | HR 81 | Temp 98.4°F | Wt 146.0 lb

## 2016-12-09 DIAGNOSIS — I2510 Atherosclerotic heart disease of native coronary artery without angina pectoris: Secondary | ICD-10-CM | POA: Diagnosis not present

## 2016-12-09 DIAGNOSIS — B182 Chronic viral hepatitis C: Secondary | ICD-10-CM

## 2016-12-09 LAB — CBC WITH DIFFERENTIAL/PLATELET
Basophils Absolute: 53 {cells}/uL (ref 0–200)
Basophils Relative: 1 %
Eosinophils Absolute: 265 {cells}/uL (ref 15–500)
Eosinophils Relative: 5 %
HCT: 36.9 % (ref 35.0–45.0)
Hemoglobin: 11.9 g/dL (ref 11.7–15.5)
Lymphocytes Relative: 37 %
Lymphs Abs: 1961 {cells}/uL (ref 850–3900)
MCH: 29.8 pg (ref 27.0–33.0)
MCHC: 32.2 g/dL (ref 32.0–36.0)
MCV: 92.5 fL (ref 80.0–100.0)
MPV: 8.3 fL (ref 7.5–12.5)
Monocytes Absolute: 424 {cells}/uL (ref 200–950)
Monocytes Relative: 8 %
Neutro Abs: 2597 {cells}/uL (ref 1500–7800)
Neutrophils Relative %: 49 %
Platelets: 383 10*3/uL (ref 140–400)
RBC: 3.99 MIL/uL (ref 3.80–5.10)
RDW: 14.5 % (ref 11.0–15.0)
WBC: 5.3 10*3/uL (ref 3.8–10.8)

## 2016-12-09 LAB — COMPLETE METABOLIC PANEL WITHOUT GFR
ALT: 23 U/L (ref 6–29)
AST: 22 U/L (ref 10–35)
Albumin: 3.9 g/dL (ref 3.6–5.1)
Alkaline Phosphatase: 79 U/L (ref 33–130)
BUN: 32 mg/dL — ABNORMAL HIGH (ref 7–25)
CO2: 29 mmol/L (ref 20–31)
Calcium: 9.1 mg/dL (ref 8.6–10.4)
Chloride: 100 mmol/L (ref 98–110)
Creat: 2.23 mg/dL — ABNORMAL HIGH (ref 0.50–0.99)
GFR, Est African American: 26 mL/min — ABNORMAL LOW
GFR, Est Non African American: 23 mL/min — ABNORMAL LOW
Glucose, Bld: 129 mg/dL — ABNORMAL HIGH (ref 65–99)
Potassium: 3.6 mmol/L (ref 3.5–5.3)
Sodium: 140 mmol/L (ref 135–146)
Total Bilirubin: 0.3 mg/dL (ref 0.2–1.2)
Total Protein: 6.8 g/dL (ref 6.1–8.1)

## 2016-12-09 NOTE — Patient Instructions (Signed)
Date 12/09/16  Dear Ms Megan Salon, As discussed in the [facility], your hepatitis C therapy will include highly effective medication(s) for treatment and will vary based on the type of hepatitis C and insurance approval.  Potential medications include:          Harvoni (sofosbuvir 90mg /ledipasvir 400mg ) tablet oral daily          OR     Epclusa (sofosbuvir 400mg /velpatasvir 100mg ) tablet oral daily          OR      Mavyret (glecaprevir 100 mg/pibrentasvir 40 mg): Take 3 tablets oral daily          OR     Zepatier (elbasvir 50 mg/grazoprevir 100 mg) oral daily, +/- ribavirin              Medications are typically for 8 or 12 weeks total ---------------------------------------------------------------- Your HCV Treatment Start Date: You will be notified by our office once the medication is approved and where you can pick it up (or if mailed)   ---------------------------------------------------------------- [facility]:   [facility] [place], [place] [address] Phone: [phone] Hours: Monday to Friday 7:30 am to 6:00 pm   Please always contact your pharmacy at least 3-4 business days before you run out of medications to ensure your next month's medication is ready or 1 week prior to running out if you receive it by mail.  Remember, each prescription is for 28 days. ---------------------------------------------------------------- GENERAL NOTES REGARDING YOUR HEPATITIS C MEDICATION:  Some medications have the following interactions:  - Acid reducing agents such as H2 blockers (ie. Pepcid (famotidine), Zantac (ranitidine), Tagamet (cimetidine), Axid (nizatidine) and proton pump inhibitors (ie. Prilosec (omeprazole), Protonix (pantoprazole), Nexium (esomeprazole), or Aciphex (rabeprazole)). Do not take until you have discussed with a health care provider.    -Antacids that contain magnesium and/or aluminum hydroxide (ie. Milk of Magensia, Rolaids,  Gaviscon, Maalox, Mylanta, an dArthritis Pain Formula).  -Calcium carbonate (calcium supplements or antacids such as Tums, Caltrate, Os-Cal).  -St. John's wort or any products that contain St. John's wort like some herbal supplements  Please inform the office prior to starting any of these medications.  - The common side effects associated with Harvoni include:      1. Fatigue      2. Headache      3. Nausea      4. Diarrhea      5. Insomnia  Please note that this only lists the most common side effects and is NOT a comprehensive list of the potential side effects of these medications. For more information, please review the drug information sheets that come with your medication package from the pharmacy.  ---------------------------------------------------------------- GENERAL HELPFUL HINTS ON HCV THERAPY: 1. Stay well-hydrated. 2. Notify the [facility] of any changes in your other over-the-counter/herbal or prescription medications. 3. If you miss a dose of your medication, take the missed dose as soon as you remember. Return to your regular time/dose schedule the next day.  4.  Do not stop taking your medications without first talking with your healthcare provider. 5.  You may take Tylenol (acetaminophen), as long as the dose is less than 2000 mg (OR no more than 4 tablets of the Tylenol Extra Strengths 500mg  tablet) in 24 hours. 6.  You will see our pharmacist-specialist within the first 2 weeks of starting your medication to monitor for any possible side effects. 7.  You will have labs once during treatment, soon  after treatment completion and one final lab 6 months after treatment completion to verify the virus is out of your system.  Scharlene Gloss, [facility] for [facility] [place], [place]  [id number] [phone]

## 2016-12-09 NOTE — Progress Notes (Signed)
[facility] for [facility]ious [facility]   CC: consideration for treatment for chronic hepatitis C  HPI:  +Monique Henderson is a 66 y.o. female who presents for initial evaluation and management of chronic hepatitis C.  Patient tested positive in the 1990s. Hepatitis C-associated risk factors present are: none. Patient denies IV drug abuse, multiple sexual partners, sexual contact with person with liver disease. Patient has had other studies performed. Results: hepatitis C RNA by PCR, result: positive. Patient has not had prior treatment for Hepatitis C. Patient does not have a past history of liver disease. Patient does not have a family history of liver disease. Patient does not  have associated signs or symptoms related to liver disease.  Labs reviewed and confirm chronic hepatitis C with a positive viral load from 2013.   Records reviewed from epic, as above, positive in 2013.   No record of abdominal imaging.     Patient does not have documented immunity to Hepatitis A. Patient does have documented immunity to Hepatitis B.    Review of Systems:  Constitutional: negative for fatigue and malaise Gastrointestinal: negative for diarrhea Integument/breast: negative for rash Musculoskeletal: negative for myalgias and arthralgias All other systems reviewed and are negative       Past Medical History:  Diagnosis Date  . Atelectasis 2002   Bilateral  . Bone spur 2008   Right calcaneal foot spur  . Breast cancer ([facility]) 2004   Ductal carcinoma in situ of the left breast; S/P left partial mastectomy 02/26/2003; S/P re-excision of cranial and lateral margins11/18/2004.radiation  . Breast cancer ([facility]) 09/21/2012   right breast/ last radiation treatment 03/22/2013  . 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 Autumn Messing; S/P re-excision of superior and inferior margins 10/27/2012.   Marland Kitchen GERD (gastroesophageal reflux disease)   . Hepatitis  C   . Hot flashes   . Hx of radiation therapy 2005   left breast  . Hx of radiation therapy 01/11/13- 03/22/13   right breast 5760 cGy 30 sessions  . Hypertension   . Low back pain   . Lumbar spinal stenosis    S/P lumbar decompressive laminectomy, fusion and plating for lumbar spinal stensosis  . Normocytic anemia    With thrombocytosis  . Osteoarthritis   . Right ureteral stone 2002  . Shortness of breath    from pain  . Uterine fibroid   . Wears dentures    top    Prior to Admission medications   Medication Sig Start Date End Date Taking? Authorizing Provider  amLODipine (NORVASC) 10 MG tablet TAKE ONE TABLET BY MOUTH ONCE DAILY 03/04/16  Yes Axel Filler, MD  B Complex Vitamins (VITAMIN B COMPLEX PO) Take 1 tablet by mouth daily.    Yes [provider]  cetirizine (ZYRTEC) 10 MG tablet TAKE ONE TABLET BY MOUTH ONCE DAILY 06/04/14  Yes Adele Barthel D, MD  cloNIDine (CATAPRES) 0.1 MG tablet Take 1 tablet (0.1 mg total) by mouth 2 (two) times daily. 11/19/15  Yes Axel Filler, MD  famotidine (PEPCID) 20 MG tablet TAKE ONE TABLET BY MOUTH AT BEDTIME 03/13/16  Yes Axel Filler, MD  gabapentin (NEURONTIN) 300 MG capsule TAKE TWO CAPSULES BY MOUTH TWICE DAILY 03/06/16  Yes Axel Filler, MD  losartan (COZAAR) 50 MG tablet Take 1 tablet (50 mg total) by mouth daily. 01/04/16  Yes Axel Filler, MD  PROAIR HFA 108 (  90 Base) MCG/ACT inhaler INHALE TWO PUFFS INTO LUNGS EVERY 6 HOURS AS NEEDED FOR WHEEZING FOR COUGH FOR SHORTNESS OF BREATH 08/05/16  Yes Axel Filler, MD  traMADol (ULTRAM) 50 MG tablet TAKE ONE TABLET BY MOUTH EVERY 12 HOURS AS NEEDED 02/17/16  Yes Sid Falcon, MD  zolpidem (AMBIEN) 5 MG tablet Take 1 tablet (5 mg total) by mouth at bedtime as needed for sleep. 12/26/15  Yes Axel Filler, MD    Allergies  Allergen Reactions  . Shrimp [Shellfish Allergy] Shortness Of Breath  . Bactroban [Mupirocin]  Other (See Comments)    "Sores in nose"  . Vicodin [Hydrocodone-Acetaminophen] Itching and Nausea And Vomiting    This is patient's home medication  . Lisinopril Cough  . Tylenol [Acetaminophen] Itching    Social History  Substance Use Topics  . Smoking status: Current Some Day Smoker    Packs/day: 0.25    Years: 44.00    Types: Cigarettes  . Smokeless tobacco: Never Used     Comment: smoking less  . Alcohol use 1.2 oz/week    2 Cans of beer per week     Comment:  "couple beers q weekend"    Family History  Problem Relation Age of Onset  . Colon cancer Mother 52  . Hypertension Mother   . Diabetes Sister 25  . Hypertension Sister   . Diabetes Brother   . Hypertension Brother   . Diabetes Brother   . Hypertension Brother   . Kidney disease Son        On dialysis  . Hypertension Son   . Diabetes Son   . Multiple sclerosis Son   . Bone cancer Sister 67  . Breast cancer Neg Hx   . Cervical cancer Neg Hx   no cirrhosis   Objective:  Constitutional: in no apparent distress,  Vitals:   12/09/16 0855  BP: (!) 150/93  Pulse: 81  Temp: 98.4 F (36.9 C)   Eyes: anicteric Cardiovascular: Cor RRR Respiratory: CTA B; normal respiratory effort Gastrointestinal: Bowel sounds are normal, liver is not enlarged, spleen is not enlarged Musculoskeletal: no pedal edema noted Skin: negatives: no rash; no porphyria cutanea tarda Lymphatic: no cervical lymphadenopathy   Laboratory Genotype: No results found for: HCVGENOTYPE HCV viral load:  Lab Results  Component Value Date   HCVQUANT 91,791 (H) 04/20/2012   Lab Results  Component Value Date   WBC 7.4 01/25/2014   HGB 11.8 (L) 01/25/2014   HCT 37.2 01/25/2014   MCV 96.1 01/25/2014   PLT 449 (H) 01/25/2014    Lab Results  Component Value Date   CREATININE 1.95 (H) 03/14/2015   BUN 18 03/14/2015   NA 144 03/14/2015   K 4.2 03/14/2015   CL 106 03/14/2015   CO2 30 03/14/2015    Lab Results  Component Value Date     ALT 43 (H) 02/14/2014   AST 35 02/14/2014   ALKPHOS 95 02/14/2014     Labs and history reviewed and show CHILD-PUGH unknown  5-6 points: Child class A 7-9 points: Child class B 10-15 points: Child class C  Lab Results  Component Value Date   INR 0.87 01/08/2014   BILITOT 0.3 02/14/2014   ALBUMIN 4.1 02/14/2014     Assessment: New Patient with Chronic Hepatitis C genotype unknown, untreated.  I discussed with the patient the lab findings that confirm chronic hepatitis C as well as the natural history and progression of disease including about 30% of people  who develop cirrhosis of the liver if left untreated and once cirrhosis is established there is a 2-7% risk per year of liver cancer and liver failure.  I discussed the importance of treatment and benefits in reducing the risk, even if significant liver fibrosis exists.  Mild renal insufficiency - last creat 1.95 from 2016, will recheck prior to treatment transaminitis - likely from hepatitis C.  Will monitor  Plan: 1) Patient counseled extensively on limiting acetaminophen to no more than 2 grams daily, avoidance of alcohol. 2) Transmission discussed with patient including sexual transmission, sharing razors and toothbrush.   3) Will need referral to gastroenterology if concern for cirrhosis 4) Will need referral for substance abuse counseling: No.; Further work up to include urine drug screen  No. 5) Will prescribe appropriate medication based on genotype and coverage  6) Hepatitis A titers, hepatitis B surface Ab positive 7) Pneumovax vaccine  9) Further work up to include liver staging with elastography 10) will follow up after starting medication

## 2016-12-10 LAB — PROTIME-INR
INR: 1
PROTHROMBIN TIME: 10.1 s (ref 9.0–11.5)

## 2016-12-10 LAB — HEPATITIS A ANTIBODY, TOTAL: HEP A TOTAL AB: NONREACTIVE

## 2016-12-10 LAB — HIV ANTIBODY (ROUTINE TESTING W REFLEX): HIV 1&2 Ab, 4th Generation: NONREACTIVE

## 2016-12-10 LAB — HEPATITIS B SURFACE ANTIGEN: HEP B S AG: NEGATIVE

## 2016-12-14 LAB — LIVER FIBROSIS, FIBROTEST-ACTITEST
ALT: 23 U/L (ref 6–29)
Alpha-2-Macroglobulin: 300 mg/dL — ABNORMAL HIGH (ref 106–279)
Apolipoprotein A1: 144 mg/dL (ref 101–198)
BILIRUBIN: 0.3 mg/dL (ref 0.2–1.2)
Fibrosis Score: 0.23
GGT: 26 U/L (ref 3–65)
HAPTOGLOBIN: 271 mg/dL — AB (ref 43–212)
NECROINFLAMMAT ACT SCORE: 0.09
Reference ID: 2027514

## 2016-12-15 ENCOUNTER — Ambulatory Visit (HOSPITAL_COMMUNITY)
Admission: RE | Admit: 2016-12-15 | Discharge: 2016-12-15 | Disposition: A | Discharge status: Home / Self Care | Payer: Medicare Other | Source: Ambulatory Visit | Attending: Internal Medicine | Admitting: Internal Medicine

## 2016-12-15 ENCOUNTER — Other Ambulatory Visit: Payer: Self-pay | Admitting: Internal Medicine

## 2016-12-15 DIAGNOSIS — K824 Cholesterolosis of gallbladder: Secondary | ICD-10-CM | POA: Diagnosis not present

## 2016-12-15 DIAGNOSIS — N281 Cyst of kidney, acquired: Secondary | ICD-10-CM | POA: Diagnosis not present

## 2016-12-15 DIAGNOSIS — B182 Chronic viral hepatitis C: Secondary | ICD-10-CM

## 2016-12-15 LAB — HCV RNA, QN PCR RFLX GENO, LIPA
HCV RNA, PCR, QN: 5.92 log IU/mL — ABNORMAL HIGH
HCV RNA, PCR, QN: 836000 IU/mL — ABNORMAL HIGH

## 2016-12-15 LAB — HEPATITIS C GENOTYPE

## 2016-12-15 MED ORDER — LEDIPASVIR-SOFOSBUVIR 90-400 MG PO TABS: 1.0000 | ORAL_TABLET | Freq: Every day | ORAL | 2 refills | Status: DC

## 2016-12-17 ENCOUNTER — Other Ambulatory Visit: Payer: Self-pay | Admitting: Pharmacist Clinician (PhC)/ Clinical Pharmacy Specialist

## 2016-12-17 MED ORDER — GLECAPREVIR-PIBRENTASVIR 100-40 MG PO TABS: 3.0000 | ORAL_TABLET | Freq: Every day | ORAL | 1 refills | Status: DC

## 2016-12-17 MED FILL — MAVYRET 100-40 MG TABS: 100-40 | 28 days supply | Qty: 84 | Fill #0

## 2016-12-17 NOTE — Progress Notes (Signed)
While I was counseling her on the phone for Harvoni, noticed that her scr has been trending up (2.23 now). CrCl is now <30 ml/min. Will change her to 8 wks of Mavyret instead. Childpugh A (F2/3).

## 2016-12-17 NOTE — Progress Notes (Signed)
Yes, good call

## 2016-12-21 ENCOUNTER — Other Ambulatory Visit: Payer: Self-pay | Admitting: Student in an Organized Health Care Education/Training Program

## 2016-12-28 ENCOUNTER — Encounter: Payer: Self-pay | Admitting: Pharmacy Technician

## 2016-12-30 ENCOUNTER — Emergency Department (HOSPITAL_COMMUNITY)
Admission: EM | Admit: 2016-12-30 | Discharge: 2016-12-30 | Disposition: A | Discharge status: Home / Self Care | Payer: Medicare Other | Attending: Emergency Medicine | Admitting: Emergency Medicine

## 2016-12-30 DIAGNOSIS — F17210 Nicotine dependence, cigarettes, uncomplicated: Secondary | ICD-10-CM | POA: Diagnosis not present

## 2016-12-30 DIAGNOSIS — I129 Hypertensive chronic kidney disease with stage 1 through stage 4 chronic kidney disease, or unspecified chronic kidney disease: Secondary | ICD-10-CM | POA: Insufficient documentation

## 2016-12-30 DIAGNOSIS — M546 Pain in thoracic spine: Secondary | ICD-10-CM | POA: Diagnosis not present

## 2016-12-30 DIAGNOSIS — Z853 Personal history of malignant neoplasm of breast: Secondary | ICD-10-CM | POA: Insufficient documentation

## 2016-12-30 DIAGNOSIS — Z79899 Other long term (current) drug therapy: Secondary | ICD-10-CM | POA: Diagnosis not present

## 2016-12-30 DIAGNOSIS — N183 Chronic kidney disease, stage 3 (moderate): Secondary | ICD-10-CM | POA: Insufficient documentation

## 2016-12-30 DIAGNOSIS — R0789 Other chest pain: Secondary | ICD-10-CM | POA: Insufficient documentation

## 2016-12-30 DIAGNOSIS — R079 Chest pain, unspecified: Secondary | ICD-10-CM | POA: Diagnosis present

## 2016-12-30 MED ORDER — IBUPROFEN 200 MG PO TABS
400.0000 mg | ORAL_TABLET | Freq: Once | ORAL | Status: AC
  Administered 2016-12-30: 400 mg via ORAL
  Filled 2016-12-30: qty 2

## 2016-12-30 MED ORDER — ACETAMINOPHEN 500 MG PO TABS
1000.0000 mg | ORAL_TABLET | Freq: Once | ORAL | Status: AC
  Administered 2016-12-30: 1000 mg via ORAL
  Filled 2016-12-30: qty 2
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
## 2016-12-30 NOTE — Discharge Instructions (Signed)
Take tylenol 1000mg(2 extra strength) four times a day.  ° °
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
## 2017-01-07 MED FILL — MAVYRET 100-40 MG TABS: 100-40 | 28 days supply | Qty: 84 | Fill #1

## 2017-01-14 ENCOUNTER — Ambulatory Visit: Payer: Medicare Other

## 2017-01-16 DIAGNOSIS — J449 Chronic obstructive pulmonary disease, unspecified: Secondary | ICD-10-CM | POA: Insufficient documentation

## 2017-01-16 HISTORY — DX: Chronic obstructive pulmonary disease, unspecified: J44.9

## 2017-01-18 ENCOUNTER — Ambulatory Visit (INDEPENDENT_AMBULATORY_CARE_PROVIDER_SITE_OTHER): Payer: Medicare Other | Admitting: Pharmacist

## 2017-01-18 DIAGNOSIS — B182 Chronic viral hepatitis C: Secondary | ICD-10-CM | POA: Diagnosis not present

## 2017-01-18 DIAGNOSIS — Z23 Encounter for immunization: Secondary | ICD-10-CM

## 2017-01-18 NOTE — Progress Notes (Signed)
HPI: Monique Henderson is a 66 y.o. female who presents to the [facility] for Hep C follow-up.  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  Lab Results  Component Value Date   HCVGENOTYPE 1b 12/09/2016    Allergies: Allergies  Allergen Reactions  . Shrimp [Shellfish Allergy] Shortness Of Breath  . Bactroban [Mupirocin] Other (See Comments)    "Sores in nose"  . Vicodin [Hydrocodone-Acetaminophen] Itching and Nausea And Vomiting    This is patient's home medication  . Lisinopril Cough  . Tylenol [Acetaminophen] Itching    Past Medical History: Past Medical History:  Diagnosis Date  . Atelectasis 2002   Bilateral  . Bone spur 2008   Right calcaneal foot spur  . Breast cancer ([place]) 2004   Ductal carcinoma in situ of the left breast; S/P left partial mastectomy 02/26/2003; S/P re-excision of cranial and lateral margins11/18/2004.radiation  . Breast cancer ([place]) 09/21/2012   right breast/ last radiation treatment 03/22/2013  . 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 Autumn Messing; S/P re-excision of superior and inferior margins 10/27/2012.   Marland Kitchen GERD (gastroesophageal reflux disease)   . Hepatitis C   . Hot flashes   . Hx of radiation therapy 2005   left breast  . Hx of radiation therapy 01/11/13- 03/22/13   right breast 5760 cGy 30 sessions  . Hypertension   . Low back pain   . Lumbar spinal stenosis    S/P lumbar decompressive laminectomy, fusion and plating for lumbar spinal stensosis  . Normocytic anemia    With thrombocytosis  . Osteoarthritis   . Right ureteral stone 2002  . Shortness of breath    from pain  . Uterine fibroid   . Wears dentures    top    Social History: Social History   Social History  . Marital status: Single    Spouse name: N/A  . Number of children: N/A  . Years of education: N/A   Social History Main Topics  . Smoking status: Current Some Day  Smoker    Packs/day: 0.25    Years: 44.00    Types: Cigarettes  . Smokeless tobacco: Never Used     Comment: smoking less  . Alcohol use 1.2 oz/week    2 Cans of beer per week     Comment:  "couple beers q weekend"  . Drug use: Yes    Types: Cocaine     Comment: "stopped back in the 1980's"  . Sexual activity: Yes    Birth control/ protection: Post-menopausal   Other Topics Concern  . Not on file   Social History Narrative  . No narrative on file    Labs: Hepatitis B Surface Ag (no units)  Date Value  12/09/2016 NEGATIVE   HCV Ab (no units)  Date Value  04/20/2012 REACTIVE (A)    Lab Results  Component Value Date   HCVGENOTYPE 1b 12/09/2016    Hepatitis C RNA quantitative Latest Ref Rng & Units 04/20/2012  HCV Quantitative <15 IU/mL 91,791(H)  HCV Quantitative Log <1.18 log 10 4.96(H)    AST (U/L)  Date Value  12/09/2016 22  02/14/2014 35  01/08/2014 69 (H)  09/28/2012 35 (H)   ALT (U/L)  Date Value  12/09/2016 23  12/09/2016 23  02/14/2014 43 (H)  01/08/2014 66 (H)  09/28/2012 41   INR (no units)  Date Value  12/09/2016 1.0  01/08/2014 0.87  12/05/2013 0.87    CrCl: CrCl cannot be calculated (Patient's most recent lab result is older than the maximum 21 days allowed.).  Fibrosis Score: F0/F1 as assessed by fibrosure   Child-Pugh Score: a  Previous Treatment Regimen: none  Assessment: Monique Henderson is here today to follow-up for her Hep C infection. She recently started 8 weeks of [place] and has completed 1 month of therapy.  She has already picked up for refill.  She is not having any issues on the Mavyret - no headaches, no fatigue, no nausea, no diarrhea. She has not missed any doses and takes all three pills together in the morning with breakfast. I will check an HCV RNA today and start her Hep A vaccine series.    Plans: - Continue Mavyret x 8 weeks - Hep A #1 today - HCV RNA and CMET today - F/u with pharmacy EOT 9/20 at  [facility]. Monique Henderson, PharmD, [facility] 01/18/2017, 12:01 PM

## 2017-01-19 ENCOUNTER — Ambulatory Visit
Admission: RE | Admit: 2017-01-19 | Discharge: 2017-01-19 | Disposition: A | Discharge status: Home / Self Care | Payer: Medicare Other | Source: Ambulatory Visit | Attending: Internal Medicine | Admitting: Internal Medicine

## 2017-01-19 DIAGNOSIS — R928 Other abnormal and inconclusive findings on diagnostic imaging of breast: Secondary | ICD-10-CM | POA: Diagnosis not present

## 2017-01-19 DIAGNOSIS — Z853 Personal history of malignant neoplasm of breast: Secondary | ICD-10-CM

## 2017-01-19 HISTORY — DX: Personal history of irradiation: Z92.3

## 2017-01-19 LAB — COMPREHENSIVE METABOLIC PANEL
ALK PHOS: 93 U/L (ref 33–130)
ALT: 12 U/L (ref 6–29)
AST: 15 U/L (ref 10–35)
Albumin: 4.2 g/dL (ref 3.6–5.1)
BUN: 24 mg/dL (ref 7–25)
CALCIUM: 9.7 mg/dL (ref 8.6–10.4)
CO2: 25 mmol/L (ref 20–32)
Chloride: 104 mmol/L (ref 98–110)
Creat: 2.09 mg/dL — ABNORMAL HIGH (ref 0.50–0.99)
GLUCOSE: 95 mg/dL (ref 65–99)
Potassium: 4.4 mmol/L (ref 3.5–5.3)
Sodium: 142 mmol/L (ref 135–146)
Total Bilirubin: 0.3 mg/dL (ref 0.2–1.2)
Total Protein: 7.1 g/dL (ref 6.1–8.1)

## 2017-01-19 LAB — HEPATITIS B SURFACE ANTIBODY,QUALITATIVE: Hep B S Ab: REACTIVE — AB

## 2017-01-21 LAB — HEPATITIS C RNA QUANTITATIVE
HCV QUANT: NOT DETECTED [IU]/mL
HCV Quantitative Log: <1.18 Log IU/mL

## 2017-02-18 ENCOUNTER — Ambulatory Visit: Payer: Medicare Other

## 2017-02-20 ENCOUNTER — Emergency Department (HOSPITAL_COMMUNITY)
Admission: EM | Admit: 2017-02-20 | Discharge: 2017-02-20 | Disposition: A | Discharge status: Home / Self Care | Payer: Medicare Other | Attending: Emergency Medicine | Admitting: Emergency Medicine

## 2017-02-20 ENCOUNTER — Emergency Department (HOSPITAL_COMMUNITY): Admission: EM | Admit: 2017-02-20 | Payer: Medicare Other | Source: Home / Self Care

## 2017-02-20 ENCOUNTER — Encounter (HOSPITAL_COMMUNITY): Payer: Self-pay

## 2017-02-20 DIAGNOSIS — F17210 Nicotine dependence, cigarettes, uncomplicated: Secondary | ICD-10-CM | POA: Insufficient documentation

## 2017-02-20 DIAGNOSIS — M79661 Pain in right lower leg: Secondary | ICD-10-CM | POA: Diagnosis present

## 2017-02-20 DIAGNOSIS — L089 Local infection of the skin and subcutaneous tissue, unspecified: Secondary | ICD-10-CM | POA: Insufficient documentation

## 2017-02-20 DIAGNOSIS — Y999 Unspecified external cause status: Secondary | ICD-10-CM | POA: Diagnosis not present

## 2017-02-20 DIAGNOSIS — Y929 Unspecified place or not applicable: Secondary | ICD-10-CM | POA: Diagnosis not present

## 2017-02-20 DIAGNOSIS — Z79899 Other long term (current) drug therapy: Secondary | ICD-10-CM | POA: Diagnosis not present

## 2017-02-20 DIAGNOSIS — N183 Chronic kidney disease, stage 3 (moderate): Secondary | ICD-10-CM | POA: Diagnosis not present

## 2017-02-20 DIAGNOSIS — X58XXXA Exposure to other specified factors, initial encounter: Secondary | ICD-10-CM | POA: Diagnosis not present

## 2017-02-20 DIAGNOSIS — I129 Hypertensive chronic kidney disease with stage 1 through stage 4 chronic kidney disease, or unspecified chronic kidney disease: Secondary | ICD-10-CM | POA: Diagnosis not present

## 2017-02-20 DIAGNOSIS — Y939 Activity, unspecified: Secondary | ICD-10-CM | POA: Insufficient documentation

## 2017-02-20 DIAGNOSIS — S8011XA Contusion of right lower leg, initial encounter: Secondary | ICD-10-CM | POA: Insufficient documentation

## 2017-02-20 DIAGNOSIS — Z853 Personal history of malignant neoplasm of breast: Secondary | ICD-10-CM | POA: Insufficient documentation

## 2017-02-20 MED ORDER — LIDOCAINE-EPINEPHRINE 1 %-1:100000 IJ SOLN
10.0000 mL | Freq: Once | INTRAMUSCULAR | Status: AC
Start: 2017-02-20 — End: 2017-02-20
  Administered 2017-02-20: 10 mL
  Filled 2017-02-20: qty 10

## 2017-02-20 MED ORDER — SULFAMETHOXAZOLE-TRIMETHOPRIM 800-160 MG PO TABS
1.0000 | ORAL_TABLET | Freq: Once | ORAL | Status: AC
Start: 2017-02-20 — End: 2017-02-20
  Administered 2017-02-20: 1 via ORAL
  Filled 2017-02-20: qty 1

## 2017-02-20 MED ORDER — OXYCODONE HCL 5 MG PO TABS
5.0000 mg | ORAL_TABLET | Freq: Once | ORAL | Status: AC
  Administered 2017-02-20: 5 mg via ORAL
  Filled 2017-02-20: qty 1

## 2017-02-20 MED ORDER — CEPHALEXIN 500 MG PO CAPS: 500.0000 mg | ORAL_CAPSULE | Freq: Three times a day (TID) | ORAL | 0 refills | Status: DC

## 2017-02-20 MED ORDER — TRAMADOL HCL 50 MG PO TABS: 50.0000 mg | ORAL_TABLET | Freq: Four times a day (QID) | ORAL | 0 refills | Status: DC | PRN

## 2017-02-20 MED ORDER — SULFAMETHOXAZOLE-TRIMETHOPRIM 800-160 MG PO TABS: 1.0000 | ORAL_TABLET | Freq: Two times a day (BID) | ORAL | 0 refills | Status: AC

## 2017-02-20 MED ORDER — CEPHALEXIN 500 MG PO CAPS
500.0000 mg | ORAL_CAPSULE | Freq: Once | ORAL | Status: AC
  Administered 2017-02-20: 500 mg via ORAL
  Filled 2017-02-20: qty 1
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
## 2017-02-20 NOTE — ED Notes (Signed)
Bed: WA23 Expected date:  Expected time:  Means of arrival:  Comments: 
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
## 2017-02-22 ENCOUNTER — Other Ambulatory Visit: Payer: Self-pay | Admitting: Internal Medicine

## 2017-02-22 DIAGNOSIS — I10 Essential (primary) hypertension: Secondary | ICD-10-CM | POA: Diagnosis not present

## 2017-02-22 DIAGNOSIS — L02415 Cutaneous abscess of right lower limb: Secondary | ICD-10-CM | POA: Diagnosis not present

## 2017-02-22 DIAGNOSIS — N183 Chronic kidney disease, stage 3 (moderate): Secondary | ICD-10-CM | POA: Diagnosis not present

## 2017-02-22 DIAGNOSIS — B182 Chronic viral hepatitis C: Secondary | ICD-10-CM

## 2017-02-23 ENCOUNTER — Ambulatory Visit (INDEPENDENT_AMBULATORY_CARE_PROVIDER_SITE_OTHER): Payer: Medicare Other | Admitting: Pharmacist

## 2017-02-23 DIAGNOSIS — B182 Chronic viral hepatitis C: Secondary | ICD-10-CM | POA: Diagnosis not present

## 2017-02-23 NOTE — Progress Notes (Signed)
HPI: Monique Henderson is a 66 y.o. female who presents to the [facility] for her EOT Hep C appointment. She has genotype 1b, F0/F1, and finished 8 weeks of Mavyret a few days ago.  Lab Results  Component Value Date   HCVGENOTYPE 1b 12/09/2016    Allergies: Allergies  Allergen Reactions  . Shrimp [Shellfish Allergy] Shortness Of Breath  . Bactroban [Mupirocin] Other (See Comments)    "Sores in nose"  . Vicodin [Hydrocodone-Acetaminophen] Itching and Nausea And Vomiting    This is patient's home medication  . Lisinopril Cough  . Tylenol [Acetaminophen] Itching    Past Medical History: Past Medical History:  Diagnosis Date  . Atelectasis 2002   Bilateral  . Bone spur 2008   Right calcaneal foot spur  . Breast cancer ([place]) 2004   Ductal carcinoma in situ of the left breast; S/P left partial mastectomy 02/26/2003; S/P re-excision of cranial and lateral margins11/18/2004.radiation  . Breast cancer ([place]) 09/21/2012   right breast/ last radiation treatment 03/22/2013  . 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 Autumn Messing; S/P re-excision of superior and inferior margins 10/27/2012.   Marland Kitchen GERD (gastroesophageal reflux disease)   . Hepatitis C   . Hot flashes   . Hx of radiation therapy 2005   left breast  . Hx of radiation therapy 01/11/13- 03/22/13   right breast 5760 cGy 30 sessions  . Hypertension   . Low back pain   . Lumbar spinal stenosis    S/P lumbar decompressive laminectomy, fusion and plating for lumbar spinal stensosis  . Normocytic anemia    With thrombocytosis  . Osteoarthritis   . Personal history of radiation therapy   . Right ureteral stone 2002  . Shortness of breath    from pain  . Uterine fibroid   . Wears dentures    top    Social History: Social History   Social History  . Marital status: Single    Spouse name: N/A  . Number of children: N/A  . Years of education: N/A   Social  History Main Topics  . Smoking status: Current Some Day Smoker    Packs/day: 0.25    Years: 44.00    Types: Cigarettes  . Smokeless tobacco: Never Used     Comment: smoking less  . Alcohol use 1.2 oz/week    2 Cans of beer per week     Comment:  "couple beers q weekend"  . Drug use: Yes    Types: Cocaine     Comment: "stopped back in the 1980's"  . Sexual activity: Yes    Birth control/ protection: Post-menopausal   Other Topics Concern  . Not on file   Social History Narrative  . No narrative on file    Labs: Hep B S Ab (no units)  Date Value  01/18/2017 REACTIVE (A)   Hepatitis B Surface Ag (no units)  Date Value  12/09/2016 NEGATIVE   HCV Ab (no units)  Date Value  04/20/2012 REACTIVE (A)    Lab Results  Component Value Date   HCVGENOTYPE 1b 12/09/2016    Hepatitis C RNA quantitative Latest Ref Rng & Units 01/18/2017 04/20/2012  HCV Quantitative NOT DETECTED IU/mL <15 NOT DETECTED 91,791(H)  HCV Quantitative Log NOT DETECTED Log IU/mL <1.18 NOT DETECTED 4.96(H)    AST (U/L)  Date Value  01/18/2017 15  12/09/2016 22  02/14/2014 35  09/28/2012 35 (H)  ALT (U/L)  Date Value  01/18/2017 12  12/09/2016 23  12/09/2016 23  02/14/2014 43 (H)  09/28/2012 41   INR (no units)  Date Value  12/09/2016 1.0  01/08/2014 0.87  12/05/2013 0.87    CrCl: CrCl cannot be calculated (Patient's most recent lab result is older than the maximum 21 days allowed.).  Fibrosis Score: F0/F1 as assessed by fibrosure   Child-Pugh Score: A  Previous Treatment Regimen: None  Assessment: Monique Henderson is here today to follow-up for her Hep C infection.  She has completed 8 weeks of Mavyret without any issues.  She missed no doses and had no side effects.  Her early on treatment Hep C viral load was already undetectable.  I will get another one today and have her follow-up in 3 months for her cure visit.   Plans: - Hep C RNA today - F/u again for cure visit 06/03/17 at  930am   L. , PharmD, [facility] 02/23/2017, 2:42 PM

## 2017-02-25 LAB — HEPATITIS C RNA QUANTITATIVE
HCV QUANT LOG: NOT DETECTED {Log_IU}/mL
HCV RNA, PCR, QN: NOT DETECTED [IU]/mL

## 2017-03-01 ENCOUNTER — Other Ambulatory Visit: Payer: Medicare Other

## 2017-03-12 ENCOUNTER — Other Ambulatory Visit: Payer: Medicare Other

## 2017-06-03 ENCOUNTER — Ambulatory Visit: Payer: Medicare Other

## 2017-06-23 ENCOUNTER — Ambulatory Visit (INDEPENDENT_AMBULATORY_CARE_PROVIDER_SITE_OTHER): Payer: Medicare Other | Admitting: Family Medicine

## 2017-06-23 ENCOUNTER — Encounter: Payer: Self-pay | Admitting: Family Medicine

## 2017-06-23 VITALS — BP 176/101 | HR 67 | Temp 98.5°F | Resp 18 | Ht 64.0 in | Wt 144.6 lb

## 2017-06-23 DIAGNOSIS — G4700 Insomnia, unspecified: Secondary | ICD-10-CM

## 2017-06-23 DIAGNOSIS — F17200 Nicotine dependence, unspecified, uncomplicated: Secondary | ICD-10-CM

## 2017-06-23 DIAGNOSIS — N1832 Chronic kidney disease, stage 3b: Secondary | ICD-10-CM

## 2017-06-23 DIAGNOSIS — K219 Gastro-esophageal reflux disease without esophagitis: Secondary | ICD-10-CM

## 2017-06-23 DIAGNOSIS — Z5181 Encounter for therapeutic drug level monitoring: Secondary | ICD-10-CM

## 2017-06-23 DIAGNOSIS — I10 Essential (primary) hypertension: Secondary | ICD-10-CM

## 2017-06-23 DIAGNOSIS — Z981 Arthrodesis status: Secondary | ICD-10-CM

## 2017-06-23 DIAGNOSIS — B182 Chronic viral hepatitis C: Secondary | ICD-10-CM | POA: Diagnosis not present

## 2017-06-23 DIAGNOSIS — N183 Chronic kidney disease, stage 3 (moderate): Secondary | ICD-10-CM | POA: Diagnosis not present

## 2017-06-23 DIAGNOSIS — M79671 Pain in right foot: Secondary | ICD-10-CM | POA: Diagnosis not present

## 2017-06-23 DIAGNOSIS — R2241 Localized swelling, mass and lump, right lower limb: Secondary | ICD-10-CM | POA: Diagnosis not present

## 2017-06-23 DIAGNOSIS — N184 Chronic kidney disease, stage 4 (severe): Secondary | ICD-10-CM | POA: Diagnosis not present

## 2017-06-23 DIAGNOSIS — G894 Chronic pain syndrome: Secondary | ICD-10-CM | POA: Diagnosis not present

## 2017-06-23 LAB — POCT URINALYSIS DIP (MANUAL ENTRY)
GLUCOSE UA: NEGATIVE mg/dL
Nitrite, UA: NEGATIVE
Protein Ur, POC: 100 mg/dL — AB
RBC UA: NEGATIVE
SPEC GRAV UA: 1.015 (ref 1.010–1.025)
Urobilinogen, UA: 0.2 E.U./dL
pH, UA: 5.5 (ref 5.0–8.0)

## 2017-06-23 MED ORDER — ALBUTEROL SULFATE HFA 108 (90 BASE) MCG/ACT IN AERS: INHALATION_SPRAY | RESPIRATORY_TRACT | 1 refills | Status: DC

## 2017-06-23 MED ORDER — FAMOTIDINE 20 MG PO TABS: 20.0000 mg | ORAL_TABLET | Freq: Every day | ORAL | 0 refills | Status: DC

## 2017-06-23 MED ORDER — CLONIDINE HCL 0.1 MG PO TABS: 0.1000 mg | ORAL_TABLET | Freq: Two times a day (BID) | ORAL | 0 refills | Status: DC

## 2017-06-23 MED ORDER — AMLODIPINE BESYLATE 10 MG PO TABS: ORAL_TABLET | ORAL | 0 refills | Status: DC

## 2017-06-23 MED ORDER — TRAMADOL HCL 50 MG PO TABS: 50.0000 mg | ORAL_TABLET | Freq: Two times a day (BID) | ORAL | 0 refills | Status: DC | PRN

## 2017-06-23 NOTE — Progress Notes (Signed)
Subjective:    Patient ID: Monique Henderson, female    DOB: November 28, 1950, 67 y.o.   MRN: [id number] Chief Complaint  Patient presents with  . Back Pain    had surgery on back 3x; states her normal PCP can not see her due to insurance  . Medication Refill    would like a refill on bp meds and pain meds    HPI HTN: Was seeing Dr. Maia Petties at [facility] but he no longer accepts pts [employer] so pt states he had to stop seeing quite a number of his pts. Now so she has not had a doctor for 3 months.  She is having HAs from being out of her BP pills.  Checks BP at home several times a day - yesterday was 179/97.  Has been out of some of her BP meds for 2 mos and prior PCP would not send in refills. She does still have the clonidine and norvasc which she has still be taking daily with EXCELLENT compliance her pt report. So she keeps stating that she is out of "all of my meds" but is actually taking 2 of her 3 bp meds - only out of her losartan 50.  Pt seems slightly confused about what precisely she is taking and did not bring any bottles, records, or other info today.  Chronic hep C: Has completed the 3 mo hep C treatment course recently. Liver US done 01/2017  COPD with ongoing Tobacco use: 1 pack q3-4d. Uses inhaler after exercise.   GERD: Famotidine CKD3: baseline Cr 06/12/16 ->11/19/2016 1.71-> 2.33, eGFR 36-> 25.  Chronic back pain: Had lumber spine fusion in 2000, 2015 and 2016. On gabapentin 670m tid.  Stopped meloxicam. Not taking any otc pain meds.  Was on oxycodone 5 or 10 bid most recently which definitely helped the most but was initially on tramadol.  Insomnia: Zolpidem 540mqhs which she has been on for a year.  Breast cancer 2004 and 2014 treated with lumpectomy  No CAD or MI - is taking aspirine 32559m Has nodule under the skin on the bottom of her right foot that is gradually becoming larger and more painful but now gotten to the point she can  hardly walk on it.  No overlying skin changes. Had similar lesion on left that had to be surgically removed - difficult since couldn't walk on it for a while but knows she has to do it as can't hardly walk on rt foot now as it is.   Past Medical History:  Diagnosis Date  . Atelectasis 2002   Bilateral  . Bone spur 2008   Right calcaneal foot spur  . [place]   Ductal carcinoma in situ of the left breast; S/P left partial mastectomy 02/26/2003; S/P re-excision of cranial and lateral margins11/18/2004.radiation  . [place]   right breast/ last radiation treatment 03/22/2013  . 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 PauAutumn Messing/P re-excision of superior and inferior margins 10/27/2012.   . GMarland KitchenRD (gastroesophageal reflux disease)   . Hepatitis C   . Hot flashes   . Hx of radiation therapy 2005   left breast  . Hx of radiation therapy 01/11/13- 03/22/13   right breast 5760 cGy 30 sessions  . Hypertension   . Low back pain   . Lumbar spinal stenosis    S/P lumbar decompressive  laminectomy, fusion and plating for lumbar spinal stensosis  . Normocytic anemia    With thrombocytosis  . Osteoarthritis   . Personal history of radiation therapy   . Right ureteral stone 2002  . Shortness of breath    from pain  . Uterine fibroid   . Wears dentures    top   Past Surgical History:  Procedure Laterality Date  . ANTERIOR LAT LUMBAR FUSION N/A 01/18/2014   Procedure: ANTERIOR LATERAL LUMBAR FUSION LUMBAR TWO-THREE;  Surgeon: Eustace Moore, MD;  Location: [place] [facility];  Service: Neurosurgery;  Laterality: N/A;  . ANTERIOR LUMBAR FUSION  01/18/2014  . BACK SURGERY    . BREAST LUMPECTOMY Left 01/2003  . BREAST LUMPECTOMY Right 2014  . BREAST LUMPECTOMY WITH NEEDLE LOCALIZATION AND AXILLARY SENTINEL LYMPH NODE BX Right 10/13/2012   Procedure: BREAST LUMPECTOMY WITH NEEDLE LOCALIZATION;  Surgeon: [name], MD;  Location: [facility];  Service: General;  Laterality: Right;  Right breast wire localized lumpectomy  . LAMINECTOMY  05/27/2009   Lumbar decompressive laminectomy, fusion and plating for lumbar spinal stensosis  . LUMBAR LAMINECTOMY/DECOMPRESSION MICRODISCECTOMY Left 03/23/2013   Procedure: LUMBAR LAMINECTOMY/DECOMPRESSION MICRODISCECTOMY 1 LEVEL;  Surgeon: Eustace Moore, MD;  Location: [place] [facility];  Service: Neurosurgery;  Laterality: Left;  LUMBAR LAMINECTOMY/DECOMPRESSION MICRODISCECTOMY 1 LEVEL  . MASTECTOMY, PARTIAL Left 02/26/2003   ; S/P re-excision of cranial and lateral margins 04/19/2003.   Marland Kitchen RE-EXCISION OF BREAST CANCER,SUPERIOR MARGINS Right 10/27/2012   Procedure: RE-EXCISION OF BREAST CANCER,SUPERIOR and inferior MARGINS;  Surgeon: [name], MD;  Location: [facility];  Service: General;  Laterality: Right;  . RE-EXCISION OF BREAST LUMPECTOMY Left 04/2003   Current Outpatient Medications on File Prior to Visit  Medication Sig Dispense Refill  . aspirin 325 MG tablet Take 325 mg by mouth daily.    . B Complex Vitamins (VITAMIN B COMPLEX PO) Take 1 tablet by mouth daily.     . cetirizine (ZYRTEC) 10 MG tablet TAKE ONE TABLET BY MOUTH ONCE DAILY 30 tablet 3  . gabapentin (NEURONTIN) 300 MG capsule TAKE TWO CAPSULES BY MOUTH TWICE DAILY 120 capsule 3  . ibuprofen (ADVIL,MOTRIN) 200 MG tablet Take 400 mg by mouth every 6 (six) hours as needed for fever, headache, mild pain, moderate pain or cramping.    Marland Kitchen losartan (COZAAR) 50 MG tablet Take 1 tablet (50 mg total) by mouth daily. (Patient not taking: Reported on 06/23/2017) 90 tablet 3  . [DISCONTINUED] lisinopril (PRINIVIL,ZESTRIL) 10 MG tablet Take 1 tablet (10 mg total) by mouth daily. 30 tablet 6   No current facility-administered medications on file prior to visit.    Allergies  Allergen Reactions  . Shrimp [Shellfish Allergy] Shortness Of Breath  . Bactroban [Mupirocin] Other (See Comments)     "Sores in nose"  . Vicodin [Hydrocodone-Acetaminophen] Itching and Nausea And Vomiting    This is patient's home medication  . Lisinopril Cough  . Tylenol [Acetaminophen] Itching   Family History  Problem Relation Age of Onset  . Colon cancer Mother 69  . Hypertension Mother   . Diabetes Sister 55  . Hypertension Sister   . Diabetes Brother   . Hypertension Brother   . Diabetes Brother   . Hypertension Brother   . Kidney disease Son        On dialysis  . Hypertension Son   . Diabetes Son   . Multiple sclerosis Son   . Bone cancer Sister 76  .  Breast cancer Neg Hx   . Cervical cancer Neg Hx    Social History   Socioeconomic History  . Marital status: Single    Spouse name: None  . Number of children: None  . Years of education: None  . Highest education level: None  Social Needs  . Financial resource strain: None  . Food insecurity - worry: None  . Food insecurity - inability: None  . Transportation needs - medical: None  . Transportation needs - non-medical: None  Occupational History  . None  Tobacco Use  . Smoking status: Current Some Day Smoker    Packs/day: 0.25    Years: 44.00    Pack years: 11.00    Types: Cigarettes  . Smokeless tobacco: Never Used  . Tobacco comment: smoking less  Substance and Sexual Activity  . Alcohol use: Yes    Alcohol/week: 1.2 oz    Types: 2 Cans of beer per week    Comment:  "couple beers q weekend"  . Drug use: Yes    Types: Cocaine    Comment: "stopped back in the 1980's"  . Sexual activity: Yes    Birth control/protection: Post-menopausal  Other Topics Concern  . None  Social History Narrative  . None   Depression screen [facility] 2/9 12/09/2016 01/20/2016 06/11/2015 03/22/2015 03/14/2015  Decreased Interest 0 0 0 0 0  Down, Depressed, Hopeless 0 0 0 0 0  PHQ - 2 Score 0 0 0 0 0  Some recent data might be hidden    Review of Systems See hpi    Objective:   Physical Exam  Constitutional: She is oriented to person,  place, and time. She appears well-developed and well-nourished. She does not appear ill.  Appears to be in pain, shifting and lifting herself frequently in the chair, wincing  HENT:  Head: Normocephalic and atraumatic.  Right Ear: External ear normal.  Left Ear: External ear normal.  Eyes: Conjunctivae are normal. No scleral icterus.  Neck: Normal range of motion. Neck supple. No thyromegaly present.  Cardiovascular: Normal rate, regular rhythm, normal heart sounds and intact distal pulses.  Pulmonary/Chest: Effort normal and breath sounds normal. No respiratory distress.  Musculoskeletal: She exhibits no edema.  Lymphadenopathy:    She has no cervical adenopathy.  Neurological: She is alert and oriented to person, place, and time.  Skin: Skin is warm and dry. She is not diaphoretic. No erythema.  Psychiatric: She has a normal mood and affect. Her behavior is normal.      BP (!) 139/92   Pulse 67   Temp 98.5 F (36.9 C) (Oral)   Resp 18   Ht _0  (1.626 m)   Wt 144 lb 9.6 oz (65.6 kg)   SpO2 100%   BMI 24.82 kg/m     EKG: NSR, no acute ischemic changes noted. No significant change noted when compared to prior EKG done 12/05/2013.   I have personally reviewed the EKG tracing and agree with the computer interpretation. Sinus  Rhythm  -Poor R-wave progression -nonspecific -consider old anterior infarct.   BORDERLINE Assessment & Plan:   1. Essential hypertension  - do not want to restart losartan since her CKD has recently been sig worsening in Epic - progressed from III to IV in 10/2016 PCP labs shows eGFR down to 25 with Cr 2.33. Recheck renal fxn today. Increase amlodipine from 5 to 10 - pt really can't provide a consistent answer on whether she is taking amlodipine - she keeps  stating she is not taking any medicine but then when I ask her about her amlodipine and clonidine specifically she says she is taking them. . .   Clearly reviewed the importance of STRICT clonidine  compliance several times with pt - that she would be at very high risk for MI/CVA if she missed a dose or more w/o being weaned of by her provider - pt insistent that she does not have any problems with bid med compliance so will cont clonidine 0.1 bid for now.  2. Chronic hepatitis C without hepatic coma ([place])  - should be cured - s/p 3 mo trx  3. Gastroesophageal reflux disease, esophagitis presence not specified   4. Chronic kidney disease, stage IV (severe) ([facility]) - has been significantly worsening over past yr so refer to nephrology  5. Tobacco use disorder - discuss screening lung CT at f/u  6. S/P lumbar spinal fusion   7. Insomnia, unspecified type  - no controlled sedatives since using tramadol for pain and h/o illegal drug use w/in the past yr so will not refill zolpidem - can discuss TCA trial if needed at f/u  8. Chronic pain syndrome   9. Medication monitoring encounter   10. Foot mass, right - poss ganglion or soft tissue cyst on plantar surface of right mid foot  - will likely need surgical referral - refer to podiatry.  11. Right foot pain    Fortunately for [place], her prior PCP records pull in through [place]. However, the med list in the last note looks like it is just a cumulative collection of prior rxs - has a lot of duplicates, abx, acute meds so not entirely sure what EXACTLY she was taking but I think we have the highlights figured out. Unfortunately for pt, we see in Care Everywhere that she was being rx'd oxycodone 10 bid but her last UDS done 08/27/2016 at her PCP office was positive for cocaine but negative for opiate screen but positive for oxycodone/oxymorphone and tramadol as well.  A different UDS breakdown collected the same day 08/27/16 was + for Coc150, Mtd300, Oxy100, Pcp25. (not sure what that translates to). Informed pt that we do not routinely provide chronic pain management but referral placed. Offered gabapentin for pain which pt was willing to take but  requested tramadol instead - works fasting - will rx a limited #. Pt should restart prior medications, labs done today, then recheck in 1 wk when she is back on her meds to make sure BP controlled and review labs.  Orders Placed This Encounter  Procedures  . Comprehensive metabolic panel    Order Specific Question:   Has the patient fasted?    Answer:   Yes  . CBC with Differential/Platelet  . Lipid panel    Order Specific Question:   Has the patient fasted?    Answer:   Yes  . ToxASSURE Select 13 (MW), Urine  . Microalbumin/Creatinine Ratio, Urine  . Ambulatory referral to Podiatry    Referral Priority:   Routine    Referral Type:   Consultation    Referral Reason:   Specialty Services Required    Requested Specialty:   Podiatry    Number of Visits Requested:   1  . Ambulatory referral to Pain Clinic    Referral Priority:   Routine    Referral Type:   Consultation    Referral Reason:   Specialty Services Required    Requested Specialty:   Pain Medicine  Number of Visits Requested:   1  . Ambulatory referral to Nephrology    Referral Priority:   Routine    Referral Type:   Consultation    Referral Reason:   Specialty Services Required    Requested Specialty:   Nephrology    Number of Visits Requested:   1  . POCT urinalysis dipstick  . EKG 12-Lead    Meds ordered this encounter  Medications  . amLODipine (NORVASC) 10 MG tablet    Sig: Take 1/2 tabs po qd x 1 wk, then 1 tab po qd    Dispense:  90 tablet    Refill:  0  . cloNIDine (CATAPRES) 0.1 MG tablet    Sig: Take 1 tablet (0.1 mg total) by mouth 2 (two) times daily.    Dispense:  180 tablet    Refill:  0  . famotidine (PEPCID) 20 MG tablet    Sig: Take 1 tablet (20 mg total) by mouth at bedtime.    Dispense:  90 tablet    Refill:  0    Please consider 90 day supplies to promote better adherence  . albuterol (PROAIR HFA) 108 (90 Base) MCG/ACT inhaler    Sig: INHALE TWO PUFFS INTO LUNGS EVERY 4 HOURS AS NEEDED  FOR WHEEZING FOR COUGH FOR SHORTNESS OF BREATH    Dispense:  18 each    Refill:  1    Please consider 90 day supplies to promote better adherence  . traMADol (ULTRAM) 50 MG tablet    Sig: Take 1 tablet (50 mg total) by mouth every 12 (twelve) hours as needed.    Dispense:  30 tablet    Refill:  0      Delman Cheadle, M.D.  Primary Care at [facility] [address] [place], [place] [address] [phone] phone [phone] fax  06/25/17 6:04 PM

## 2017-06-23 NOTE — Patient Instructions (Addendum)
Stop your aspirin 325mg  - this is going to be bad for your kidneys. Do not use any other otc pain medication other than tylenol/acetaminophen - so no aleve, ibuprofen, motrin, advil, BC, Goody's, etc.     IF you received an x-ray today, you will receive an invoice from [facility]. Please contact [facility] at [phone] with questions or concerns regarding your invoice.   IF you received labwork today, you will receive an invoice from [facility]. Please contact [facility] at [phone] with questions or concerns regarding your invoice.   Our billing staff will not be able to assist you with questions regarding bills from these companies.  You will be contacted with the lab results as soon as they are available. The fastest way to get your results is to activate your My Chart account. Instructions are located on the last page of this paperwork. If you have not heard from [place] regarding the results in 2 weeks, please contact this office.      Chronic Kidney Disease, Adult Chronic kidney disease (CKD) happens when the kidneys are damaged during a time of 3 or more months. The kidneys are two organs that do many important jobs in the body. These jobs include:  Removing wastes and extra fluids from the blood.  Making hormones that maintain the amount of fluid in your tissues and blood vessels.  Making sure that the body has the right amount of fluids and chemicals.  Most of the time, this condition does not go away, but it can usually be controlled. Steps must be taken to slow down the kidney damage or stop it from getting worse. Otherwise, the kidneys may stop working. Follow these instructions at home:  Follow your diet as told by your doctor. You may need to avoid alcohol, salty foods (sodium), and foods that are high in potassium, calcium, and protein.  Take over-the-counter and prescription medicines only as told by your doctor. Do not take any new medicines unless  your doctor says you can do that. These include vitamins and minerals. ? Medicines and nutritional supplements can make kidney damage worse. ? Your doctor may need to change how much medicine you take.  Do not use any tobacco products. These include cigarettes, chewing tobacco, and e-cigarettes. If you need help quitting, ask your doctor.  Keep all follow-up visits as told by your doctor. This is important.  Check your blood pressure. Tell your doctor if there are changes to your blood pressure.  Get to a healthy weight. Stay at that weight. If you need help with this, ask your doctor.  Start or continue an exercise plan. Try to exercise at least 30 minutes a day, 5 days a week.  Stay up-to-date with your shots (immunizations) as told by your doctor. Contact a doctor if:  Your symptoms get worse.  You have new symptoms. Get help right away if:  You have symptoms of end-stage kidney disease. These include: ? Headaches. ? Skin that is darker or lighter than normal. ? Numbness in your hands or feet. ? Easy bruising. ? Having hiccups often. ? Chest pain. ? Shortness of breath. ? Stopping of menstrual periods in women.  You have a fever.  You are making very little pee (urine).  You have pain or bleeding when you pee (urinate). This information is not intended to replace advice given to you by your health care provider. Make sure you discuss any questions you have with your health care provider. Document Released: 08/12/2009 Document  Revised: 10/24/2015 Document Reviewed: 01/15/2012 Elsevier Interactive Patient Education  2017 [employer] for Chronic Kidney Disease When your kidneys are not working well, they cannot remove waste and excess substances from your blood as effectively as they did before. This can lead to a buildup and imbalance of these substances, which can worsen kidney damage and affect how your body functions. Certain foods lead to a buildup of  these substances in the body. By changing your diet as recommended by your diet and nutrition specialist (dietitian) or health care provider, you could help prevent further kidney damage and delay or prevent the need for dialysis. What are tips for following this plan? General instructions  Work with your health care provider and dietitian to develop a meal plan that is right for you. Foods you can eat, limit, or avoid will be different for each person depending on the stage of kidney disease and any other existing health conditions.  Talk with your health care provider about whether you should take a vitamin and mineral supplement.  Use standard measuring cups and spoons to measure servings of foods. Use a kitchen scale to measure portions of protein foods.  If directed by your health care provider, avoid drinking too much fluid. Measure and count all liquids, including water, ice, soups, flavored gelatin, and frozen desserts such as popsicles or ice cream. Reading food labels  Check the amount of sodium in foods. Choose foods that have less than 300 milligrams (mg) per serving.  Check the ingredient list for phosphorus or potassium-based additives or preservatives.  Check the amount of saturated and trans fat. Limit or avoid these fats as told by your dietitian. Shopping  Avoid buying foods that are: ? Processed, frozen, or prepackaged. ? Calcium-enriched or fortified.  Do not buy foods that have salt or sodium listed among the first five ingredients.  Do not buy canned vegetables. Cooking  Replace animal proteins, such as meat, fish, eggs, or dairy, with plant proteins from beans, nuts, and soy. ? Use soy milk instead of cow's milk. ? Add beans or tofu to soups, casseroles, or pasta dishes instead of meat.  Soak vegetables, such as potatoes, before cooking to reduce potassium. To do this: ? Peel and cut into small pieces. ? Soak in warm water for at least 2 hours. For every 1  cup of vegetables, use 10 cups of water. ? Drain and rinse with warm water. ? Boil for at least 5 minutes. Meal planning  Limit the amount of protein from plant and animal sources you eat each day.  Do not add salt to food when cooking or before eating.  Eat meals and snacks at around the same time each day. If you have diabetes:  If you have diabetes (diabetes mellitus) and chronic kidney disease, it is important to keep your blood glucose in the target range recommended by your health care provider. Follow your diabetes management plan. This may include: ? Checking your blood glucose regularly. ? Taking oral medicines, insulin, or both. ? Exercising for at least 30 minutes on 5 or more days each week, or as told by your health care provider. ? Tracking how many servings of carbohydrates you eat at each meal.  You may be given specific guidelines on how much of certain foods and nutrients you may eat, depending on your stage of kidney disease and whether you have high blood pressure (hypertension). Follow your meal plan as told by your dietitian. What nutrients should  be limited? The items listed are not a complete list. Talk with your dietitian about what dietary choices are best for you. Potassium Potassium affects how steadily your heart beats. If too much potassium builds up in your blood, it can cause an irregular heartbeat or even a heart attack. You may need to eat less potassium, depending on your blood potassium levels and the stage of kidney disease. Talk to your dietitian about how much potassium you may have each day. You may need to limit or avoid foods that are high in potassium, such as:  Milk and soy milk.  Fruits, such as bananas, papaya, apricots, nectarines, melon, prunes, raisins, kiwi, and oranges.  Vegetables, such as potatoes, sweet potatoes, yams, tomatoes, leafy greens, beets, okra, avocado, pumpkin, and winter squash.  White and lima  beans.  Phosphorus Phosphorus is a mineral found in your bones. A balance between calcium and phosphorous is needed to build and maintain healthy bones. Too much phosphorus pulls calcium from your bones. This can make your bones weak and more likely to break. Too much phosphorus can also make your skin itch. You may need to eat less phosphorus depending on your blood phosphorus levels and the stage of kidney disease. Talk to your dietitian about how much potassium you may have each day. You may need to take medicine to lower your blood phosphorus levels if diet changes do not help. You may need to limit or avoid foods that are high in phosphorus, such as:  Milk and dairy products.  Dried beans and peas.  Tofu, soy milk, and other soy-based meat replacements.  Colas.  Nuts and peanut butter.  Meat, poultry, and fish.  Bran cereals and oatmeals.  Protein Protein helps you to make and keep muscle. It also helps in the repair of your body's cells and tissues. One of the natural breakdown products of protein is a waste product called urea. When your kidneys are not working properly, they cannot remove wastes, such as urea, like they did before you developed chronic kidney disease. Reducing how much protein you eat can help prevent a buildup of urea in your blood. Depending on your stage of kidney disease, you may need to limit foods that are high in protein. Sources of animal protein include:  Meat (all types).  Fish and seafood.  Poultry.  Eggs.  Dairy.  Other protein foods include:  Beans and legumes.  Nuts and nut butter.  Soy and tofu.  Sodium Sodium, which is found in salt, helps maintain a healthy balance of fluids in your body. Too much sodium can increase your blood pressure and have a negative effect on the function of your heart and lungs. Too much sodium can also cause your body to retain too much fluid, making your kidneys work harder. Most people should have less  than 2,300 milligrams (mg) of sodium each day. If you have hypertension, you may need to limit your sodium to 1,500 mg each day. Talk to your dietitian about how much sodium you may have each day. You may need to limit or avoid foods that are high in sodium, such as:  Salt seasonings.  Soy sauce.  Cured and processed meats.  Salted crackers and snack foods.  Fast food.  Canned soups and most canned foods.  Pickled foods.  Vegetable juice.  Boxed mixes or ready-to-eat boxed meals and side dishes.  Bottled dressings, sauces, and marinades.  Summary  Chronic kidney disease can lead to a buildup and imbalance  of waste and excess substances in the body. Certain foods lead to a buildup of these substances. By adjusting your intake of these foods, you could help prevent more kidney damage and delay or prevent the need for dialysis.  Food adjustments are different for each person with chronic kidney disease. Work with a dietitian to set up nutrient goals and a meal plan that is right for you.  If you have diabetes and chronic kidney disease, it is important to keep your blood glucose in the target range recommended by your health care provider. This information is not intended to replace advice given to you by your health care provider. Make sure you discuss any questions you have with your health care provider. Document Released: 08/08/2002 Document Revised: 05/13/2016 Document Reviewed: 05/13/2016 Elsevier Interactive Patient Education  2018 [employer].  Managing Your Hypertension Hypertension is commonly called high blood pressure. This is when the force of your blood pressing against the walls of your arteries is too strong. Arteries are blood vessels that carry blood from your heart throughout your body. Hypertension forces the heart to work harder to pump blood, and may cause the arteries to become narrow or stiff. Having untreated or uncontrolled hypertension can cause heart  attack, stroke, kidney disease, and other problems. What are blood pressure readings? A blood pressure reading consists of a higher number over a lower number. Ideally, your blood pressure should be below 120/80. The first ("top") number is called the systolic pressure. It is a measure of the pressure in your arteries as your heart beats. The second ("bottom") number is called the diastolic pressure. It is a measure of the pressure in your arteries as the heart relaxes. What does my blood pressure reading mean? Blood pressure is classified into four stages. Based on your blood pressure reading, your health care provider may use the following stages to determine what type of treatment you need, if any. Systolic pressure and diastolic pressure are measured in a unit called mm Hg. Normal  Systolic pressure: below 332.  Diastolic pressure: below 80. Elevated  Systolic pressure: 951-884.  Diastolic pressure: below 80. Hypertension stage 1  Systolic pressure: 166-063.  Diastolic pressure: 01-60. Hypertension stage 2  Systolic pressure: 109 or above.  Diastolic pressure: 90 or above. What health risks are associated with hypertension? Managing your hypertension is an important responsibility. Uncontrolled hypertension can lead to:  A heart attack.  A stroke.  A weakened blood vessel (aneurysm).  Heart failure.  Kidney damage.  Eye damage.  Metabolic syndrome.  Memory and concentration problems.  What changes can I make to manage my hypertension? Hypertension can be managed by making lifestyle changes and possibly by taking medicines. Your health care provider will help you make a plan to bring your blood pressure within a normal range. Eating and drinking  Eat a diet that is high in fiber and potassium, and low in salt (sodium), added sugar, and fat. An example eating plan is called the DASH (Dietary Approaches to Stop Hypertension) diet. To eat this way: ? Eat plenty of  fresh fruits and vegetables. Try to fill half of your plate at each meal with fruits and vegetables. ? Eat whole grains, such as whole wheat pasta, brown rice, or whole grain bread. Fill about one quarter of your plate with whole grains. ? Eat low-fat diary products. ? Avoid fatty cuts of meat, processed or cured meats, and poultry with skin. Fill about one quarter of your plate with lean proteins  such as fish, chicken without skin, beans, eggs, and tofu. ? Avoid premade and processed foods. These tend to be higher in sodium, added sugar, and fat.  Reduce your daily sodium intake. Most people with hypertension should eat less than 1,500 mg of sodium a day.  Limit alcohol intake to no more than 1 drink a day for nonpregnant women and 2 drinks a day for men. One drink equals 12 oz of beer, 5 oz of wine, or 1 oz of hard liquor. Lifestyle  Work with your health care provider to maintain a healthy body weight, or to lose weight. Ask what an ideal weight is for you.  Get at least 30 minutes of exercise that causes your heart to beat faster (aerobic exercise) most days of the week. Activities may include walking, swimming, or biking.  Include exercise to strengthen your muscles (resistance exercise), such as weight lifting, as part of your weekly exercise routine. Try to do these types of exercises for 30 minutes at least 3 days a week.  Do not use any products that contain nicotine or tobacco, such as cigarettes and e-cigarettes. If you need help quitting, ask your health care provider.  Control any long-term (chronic) conditions you have, such as high cholesterol or diabetes. Monitoring  Monitor your blood pressure at home as told by your health care provider. Your personal target blood pressure may vary depending on your medical conditions, your age, and other factors.  Have your blood pressure checked regularly, as often as told by your health care provider. Working with your health care  provider  Review all the medicines you take with your health care provider because there may be side effects or interactions.  Talk with your health care provider about your diet, exercise habits, and other lifestyle factors that may be contributing to hypertension.  Visit your health care provider regularly. Your health care provider can help you create and adjust your plan for managing hypertension. Will I need medicine to control my blood pressure? Your health care provider may prescribe medicine if lifestyle changes are not enough to get your blood pressure under control, and if:  Your systolic blood pressure is 130 or higher.  Your diastolic blood pressure is 80 or higher.  Take medicines only as told by your health care provider. Follow the directions carefully. Blood pressure medicines must be taken as prescribed. The medicine does not work as well when you skip doses. Skipping doses also puts you at risk for problems. Contact a health care provider if:  You think you are having a reaction to medicines you have taken.  You have repeated (recurrent) headaches.  You feel dizzy.  You have swelling in your ankles.  You have trouble with your vision. Get help right away if:  You develop a severe headache or confusion.  You have unusual weakness or numbness, or you feel faint.  You have severe pain in your chest or abdomen.  You vomit repeatedly.  You have trouble breathing. Summary  Hypertension is when the force of blood pumping through your arteries is too strong. If this condition is not controlled, it may put you at risk for serious complications.  Your personal target blood pressure may vary depending on your medical conditions, your age, and other factors. For most people, a normal blood pressure is less than 120/80.  Hypertension is managed by lifestyle changes, medicines, or both. Lifestyle changes include weight loss, eating a healthy, low-sodium diet, exercising  more, and limiting alcohol.  This information is not intended to replace advice given to you by your health care provider. Make sure you discuss any questions you have with your health care provider. Document Released: 02/10/2012 Document Revised: 04/15/2016 Document Reviewed: 04/15/2016 Elsevier Interactive Patient Education  [employer].

## 2017-06-24 LAB — CBC WITH DIFFERENTIAL/PLATELET
Basophils Absolute: 0 10*3/uL (ref 0.0–0.2)
Basos: 0 %
EOS (ABSOLUTE): 0.2 10*3/uL (ref 0.0–0.4)
Eos: 3 %
HEMATOCRIT: 37.6 % (ref 34.0–46.6)
Hemoglobin: 12.5 g/dL (ref 11.1–15.9)
IMMATURE GRANS (ABS): 0 10*3/uL (ref 0.0–0.1)
IMMATURE GRANULOCYTES: 0 %
LYMPHS ABS: 2.9 10*3/uL (ref 0.7–3.1)
Lymphs: 49 %
MCH: 28.8 pg (ref 26.6–33.0)
MCHC: 33.2 g/dL (ref 31.5–35.7)
MCV: 87 fL (ref 79–97)
MONOCYTES: 6 %
Monocytes Absolute: 0.4 10*3/uL (ref 0.1–0.9)
NEUTROS PCT: 42 %
Neutrophils Absolute: 2.6 10*3/uL (ref 1.4–7.0)
Platelets: 449 10*3/uL — ABNORMAL HIGH (ref 150–379)
RBC: 4.34 x10E6/uL (ref 3.77–5.28)
RDW: 15.1 % (ref 12.3–15.4)
WBC: 6.1 10*3/uL (ref 3.4–10.8)

## 2017-06-24 LAB — COMPREHENSIVE METABOLIC PANEL
ALBUMIN: 4.4 g/dL (ref 3.6–4.8)
ALK PHOS: 97 IU/L (ref 39–117)
ALT: 11 IU/L (ref 0–32)
AST: 16 IU/L (ref 0–40)
Albumin/Globulin Ratio: 1.5 (ref 1.2–2.2)
BUN / CREAT RATIO: 15 (ref 12–28)
BUN: 23 mg/dL (ref 8–27)
Bilirubin Total: <0.2 mg/dL (ref 0.0–1.2)
CO2: 25 mmol/L (ref 20–29)
Calcium: 10 mg/dL (ref 8.7–10.3)
Chloride: 102 mmol/L (ref 96–106)
Creatinine, Ser: 1.57 mg/dL — ABNORMAL HIGH (ref 0.57–1.00)
GFR calc Af Amer: 40 mL/min/{1.73_m2} — ABNORMAL LOW (ref >59)
GFR calc non Af Amer: 35 mL/min/{1.73_m2} — ABNORMAL LOW (ref >59)
Globulin, Total: 3 g/dL (ref 1.5–4.5)
Glucose: 90 mg/dL (ref 65–99)
Potassium: 4.5 mmol/L (ref 3.5–5.2)
Sodium: 144 mmol/L (ref 134–144)
TOTAL PROTEIN: 7.4 g/dL (ref 6.0–8.5)

## 2017-06-24 LAB — LIPID PANEL
CHOL/HDL RATIO: 4.9 ratio — AB (ref 0.0–4.4)
Cholesterol, Total: 195 mg/dL (ref 100–199)
HDL: 40 mg/dL (ref >39)
LDL CALC: 126 mg/dL — AB (ref 0–99)
TRIGLYCERIDES: 143 mg/dL (ref 0–149)
VLDL CHOLESTEROL CAL: 29 mg/dL (ref 5–40)

## 2017-06-24 LAB — MICROALBUMIN / CREATININE URINE RATIO
Creatinine, Urine: 272.3 mg/dL
Microalb/Creat Ratio: 111.1 mg/g creat — ABNORMAL HIGH (ref 0.0–30.0)
Microalbumin, Urine: 302.6 ug/mL

## 2017-06-27 NOTE — Addendum Note (Signed)
Addended by: [name] on: 06/27/2017 05:15 PM   Modules accepted: Orders

## 2017-06-28 LAB — TOXASSURE SELECT 13 (MW), URINE

## 2017-06-30 ENCOUNTER — Ambulatory Visit: Payer: Medicare Other | Admitting: Podiatry

## 2017-07-07 ENCOUNTER — Telehealth: Payer: Self-pay | Admitting: Family Medicine

## 2017-07-07 NOTE — Telephone Encounter (Signed)
Called to remind pt of appt tomorrow.  Mailbox was full so no message was left  . If they call back, please advise them of their apt time, building and 15 minutes early as well as the 10 minute late policy.

## 2017-07-08 ENCOUNTER — Ambulatory Visit: Payer: Medicare Other | Admitting: Family Medicine

## 2017-07-12 ENCOUNTER — Ambulatory Visit (INDEPENDENT_AMBULATORY_CARE_PROVIDER_SITE_OTHER): Payer: Medicare Other | Admitting: Podiatry

## 2017-07-12 ENCOUNTER — Encounter: Payer: Self-pay | Admitting: Podiatry

## 2017-07-12 ENCOUNTER — Ambulatory Visit: Payer: Self-pay

## 2017-07-12 VITALS — BP 142/101 | HR 73 | Ht 64.0 in | Wt 149.0 lb

## 2017-07-12 DIAGNOSIS — M722 Plantar fascial fibromatosis: Secondary | ICD-10-CM

## 2017-07-12 NOTE — Progress Notes (Signed)
   Subjective:    Patient ID: Monique Henderson, female    DOB: [birth year], 67 y.o.   MRN: [id number]  HPI  Chief Complaint  Patient presents with  . Foot Pain    Right foot, arch pain for many months; stinging pain. Surgery Left foot 2007 for the same problem       Review of Systems  All other systems reviewed and are negative.      Objective:   Physical Exam        Assessment & Plan:

## 2017-07-13 NOTE — Progress Notes (Signed)
Subjective: Patient presents today for stinging pain and tenderness in the right arch that has been ongoing for the past several months. Patient states that it hurts in the mornings with the first steps out of bed. There are no alleviating factors noted. She reports h/o surgical intervention in 2007 for the same complaint. Patient presents today for further treatment and evaluation.   Past Medical History:  Diagnosis Date  . Atelectasis 2002   Bilateral  . Bone spur 2008   Right calcaneal foot spur  . Breast cancer ([place]) 2004   Ductal carcinoma in situ of the left breast; S/P left partial mastectomy 02/26/2003; S/P re-excision of cranial and lateral margins11/18/2004.radiation  . Breast cancer ([place]) 09/21/2012   right breast/ last radiation treatment 03/22/2013  . 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 Autumn Messing; S/P re-excision of superior and inferior margins 10/27/2012.   Marland Kitchen GERD (gastroesophageal reflux disease)   . Hepatitis C   . Hot flashes   . Hx of radiation therapy 2005   left breast  . Hx of radiation therapy 01/11/13- 03/22/13   right breast 5760 cGy 30 sessions  . Hypertension   . Low back pain   . Lumbar spinal stenosis    S/P lumbar decompressive laminectomy, fusion and plating for lumbar spinal stensosis  . Normocytic anemia    With thrombocytosis  . Osteoarthritis   . Personal history of radiation therapy   . Right ureteral stone 2002  . Shortness of breath    from pain  . Uterine fibroid   . Wears dentures    top     Objective: Physical Exam General: The patient is alert and oriented x3 in no acute distress.  Dermatology: Skin is warm, dry and supple bilateral lower extremities. Negative for open lesions or macerations bilateral.   Vascular: Dorsalis Pedis and Posterior Tibial pulses palpable bilateral.  Capillary fill time is immediate to all digits.  Neurological: Epicritic and protective  threshold intact bilateral.   Musculoskeletal: Tenderness to palpation at the medial calcaneal tubercale and through the insertion of the plantar fascia of the right foot. All other joints range of motion within normal limits bilateral. Strength 5/5 in all groups bilateral.  There is also a palpable nodule along the medial longitudinal arch of the right foot consistent with a plantar fibroma  Radiographic exam: Normal osseous mineralization. Joint spaces preserved. No fracture/dislocation/boney destruction. Calcaneal spur present with mild thickening of plantar fascia right. No other soft tissue abnormalities or radiopaque foreign bodies.   Assessment: 1. Plantar fasciitis right 2. Pain in right foot 3. Plantar fibroma right  Plan of Care:  1. Patient evaluated. Xrays reviewed.   2. Injection of 0.5cc Celestone soluspan injected into the right plantar fascia  3. Instructed patient regarding therapies and modalities at home to alleviate symptoms.  4. Return to clinic in 4 weeks. If there is no improvement, we will plan for surgery.    Edrick Kins, DPM [facility]  Dr. Edrick Kins, DPM    [address], [place] [address]                Office [phone]  Fax [phone])  [phone]     

## 2017-07-19 ENCOUNTER — Encounter: Payer: Self-pay | Admitting: Family Medicine

## 2017-07-19 ENCOUNTER — Other Ambulatory Visit: Payer: Self-pay

## 2017-07-19 ENCOUNTER — Ambulatory Visit (INDEPENDENT_AMBULATORY_CARE_PROVIDER_SITE_OTHER): Payer: Medicare Other | Admitting: Family Medicine

## 2017-07-19 VITALS — BP 137/82 | HR 80 | Temp 98.4°F | Resp 16 | Ht 64.0 in | Wt 145.4 lb

## 2017-07-19 DIAGNOSIS — N184 Chronic kidney disease, stage 4 (severe): Secondary | ICD-10-CM

## 2017-07-19 DIAGNOSIS — N1832 Chronic kidney disease, stage 3b: Secondary | ICD-10-CM

## 2017-07-19 DIAGNOSIS — N183 Chronic kidney disease, stage 3 (moderate): Secondary | ICD-10-CM | POA: Diagnosis not present

## 2017-07-19 DIAGNOSIS — G894 Chronic pain syndrome: Secondary | ICD-10-CM

## 2017-07-19 DIAGNOSIS — Z981 Arthrodesis status: Secondary | ICD-10-CM | POA: Diagnosis not present

## 2017-07-19 DIAGNOSIS — I10 Essential (primary) hypertension: Secondary | ICD-10-CM

## 2017-07-19 DIAGNOSIS — F17200 Nicotine dependence, unspecified, uncomplicated: Secondary | ICD-10-CM | POA: Diagnosis not present

## 2017-07-19 DIAGNOSIS — B182 Chronic viral hepatitis C: Secondary | ICD-10-CM

## 2017-07-19 HISTORY — DX: Chronic kidney disease, stage 4 (severe): N18.4

## 2017-07-19 MED ORDER — TRAMADOL HCL 50 MG PO TABS: 50.0000 mg | ORAL_TABLET | Freq: Three times a day (TID) | ORAL | 0 refills | Status: DC | PRN

## 2017-07-19 MED ORDER — CARVEDILOL 6.25 MG PO TABS: 6.2500 mg | ORAL_TABLET | Freq: Two times a day (BID) | ORAL | 1 refills | Status: DC

## 2017-07-19 MED ORDER — GABAPENTIN 300 MG PO CAPS: 600.0000 mg | ORAL_CAPSULE | Freq: Two times a day (BID) | ORAL | 2 refills | Status: DC

## 2017-07-19 NOTE — Progress Notes (Signed)
Subjective:  This chart was scribed for Monique Knapp, MD by [name], at [place] at [facility]. This patient was seen in room 3 and the patient's care was started at 10:44 AM.   Chief Complaint  Patient presents with  . 2 Week Follow-up    review labs, blood pressure      Patient ID: Monique Henderson, female    DOB: [birth year], 67 y.o.   MRN: [id number]  HPI HPI Comments: Monique Henderson is a 67 y.o. female who presents to Primary Care at [facility] for a one month follow up. She presented as a new patient with numerous medical histories, co morbidities, complications and medication list.    Hypertension: Patient has been checking her blood pressures with her cuff (at home) and states that it has been ranging around (140/70).    Medications: Patient would like a refill on her Gabapentin (has 6-7 left) and Tramadol (ran out last night). She is compliant with her amlodipine once per day and klonopin (one in the morning and one at night). She denies any side effects from these medications.   Smoking: Patient started smoking at the age of 67. She smokes 1/4-1/3 pack per day and has taken several breaks between the time she started and now.   Foot/back pain: Patient went to the foot doctor to have the nodule on her right foot (which was causing her pain) examined.  She is also complaining of back pain today.   CKD: She has not yet received a call from [place] [facility].   GERD: Patient takes Pepcid at night time.  She denies any compliant with her heart burn.    Past Medical History:  Diagnosis Date  . Atelectasis 2002   Bilateral  . Bone spur 2008   Right calcaneal foot spur  . Breast cancer ([place]) 2004   Ductal carcinoma in situ of the left breast; S/P left partial mastectomy 02/26/2003; S/P re-excision of cranial and lateral margins11/18/2004.radiation  . Breast cancer ([place]) 09/21/2012   right breast/ last radiation treatment 03/22/2013  . 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 Autumn Messing; S/P re-excision of superior and inferior margins 10/27/2012.   Marland Kitchen GERD (gastroesophageal reflux disease)   . Hepatitis C   . Hot flashes   . Hx of radiation therapy 2005   left breast  . Hx of radiation therapy 01/11/13- 03/22/13   right breast 5760 cGy 30 sessions  . Hypertension   . Low back pain   . Lumbar spinal stenosis    S/P lumbar decompressive laminectomy, fusion and plating for lumbar spinal stensosis  . Normocytic anemia    With thrombocytosis  . Osteoarthritis   . Personal history of radiation therapy   . Right ureteral stone 2002  . Shortness of breath    from pain  . Uterine fibroid   . Wears dentures    top    Current Outpatient Medications on File Prior to Visit  Medication Sig Dispense Refill  . albuterol (PROAIR HFA) 108 (90 Base) MCG/ACT inhaler INHALE TWO PUFFS INTO LUNGS EVERY 4 HOURS AS NEEDED FOR WHEEZING FOR COUGH FOR SHORTNESS OF BREATH 18 each 1  . amLODipine (NORVASC) 10 MG tablet Take 1/2 tabs po qd x 1 wk, then 1 tab po qd 90 tablet 0  . cloNIDine (CATAPRES) 0.1 MG tablet Take 1 tablet (0.1 mg total) by mouth 2 (two) times daily. 180 tablet 0  .  famotidine (PEPCID) 20 MG tablet Take 1 tablet (20 mg total) by mouth at bedtime. 90 tablet 0  . gabapentin (NEURONTIN) 300 MG capsule TAKE TWO CAPSULES BY MOUTH TWICE DAILY 120 capsule 3  . traMADol (ULTRAM) 50 MG tablet Take 1 tablet (50 mg total) by mouth every 12 (twelve) hours as needed. 30 tablet 0  . doxycycline (MONODOX) 100 MG capsule doxycycline monohydrate 100 mg capsule    . [DISCONTINUED] lisinopril (PRINIVIL,ZESTRIL) 10 MG tablet Take 1 tablet (10 mg total) by mouth daily. 30 tablet 6   No current facility-administered medications on file prior to visit.     Allergies  Allergen Reactions  . Shrimp [Shellfish Allergy] Shortness Of Breath  . Bactroban [Mupirocin] Other (See Comments)    "Sores in nose"  . Vicodin  [Hydrocodone-Acetaminophen] Itching and Nausea And Vomiting    This is patient's home medication  . Lisinopril Cough  . Tylenol [Acetaminophen] Itching   Past Surgical History:  Procedure Laterality Date  . ANTERIOR LAT LUMBAR FUSION N/A 01/18/2014   Procedure: ANTERIOR LATERAL LUMBAR FUSION LUMBAR TWO-THREE;  Surgeon: Eustace Moore, MD;  Location: [place] [facility];  Service: Neurosurgery;  Laterality: N/A;  . ANTERIOR LUMBAR FUSION  01/18/2014  . BACK SURGERY    . BREAST LUMPECTOMY Left 01/2003  . BREAST LUMPECTOMY Right 2014  . BREAST LUMPECTOMY WITH NEEDLE LOCALIZATION AND AXILLARY SENTINEL LYMPH NODE BX Right 10/13/2012   Procedure: BREAST LUMPECTOMY WITH NEEDLE LOCALIZATION;  Surgeon: [name], MD;  Location: [place];  Service: General;  Laterality: Right;  Right breast wire localized lumpectomy  . LAMINECTOMY  05/27/2009   Lumbar decompressive laminectomy, fusion and plating for lumbar spinal stensosis  . LUMBAR LAMINECTOMY/DECOMPRESSION MICRODISCECTOMY Left 03/23/2013   Procedure: LUMBAR LAMINECTOMY/DECOMPRESSION MICRODISCECTOMY 1 LEVEL;  Surgeon: Eustace Moore, MD;  Location: [place] [facility];  Service: Neurosurgery;  Laterality: Left;  LUMBAR LAMINECTOMY/DECOMPRESSION MICRODISCECTOMY 1 LEVEL  . MASTECTOMY, PARTIAL Left 02/26/2003   ; S/P re-excision of cranial and lateral margins 04/19/2003.   Marland Kitchen RE-EXCISION OF BREAST CANCER,SUPERIOR MARGINS Right 10/27/2012   Procedure: RE-EXCISION OF BREAST CANCER,SUPERIOR and inferior MARGINS;  Surgeon: [name], MD;  Location: [place];  Service: General;  Laterality: Right;  . RE-EXCISION OF BREAST LUMPECTOMY Left 04/2003   Family History  Problem Relation Age of Onset  . Colon cancer Mother 102  . Hypertension Mother   . Diabetes Sister 56  . Hypertension Sister   . Diabetes Brother   . Hypertension Brother   . Diabetes Brother   . Hypertension Brother   . Kidney disease Son        On dialysis  . Hypertension Son   .  Diabetes Son   . Multiple sclerosis Son   . Bone cancer Sister 50  . Breast cancer Neg Hx   . Cervical cancer Neg Hx    Social History   Socioeconomic History  . Marital status: Single    Spouse name: None  . Number of children: None  . Years of education: None  . Highest education level: None  Social Needs  . Financial resource strain: None  . Food insecurity - worry: None  . Food insecurity - inability: None  . Transportation needs - medical: None  . Transportation needs - non-medical: None  Occupational History  . None  Tobacco Use  . Smoking status: Current Some Day Smoker    Packs/day: 0.25    Years: 44.00    Pack  years: 11.00    Types: Cigarettes  . Smokeless tobacco: Never Used  . Tobacco comment: smoking less  Substance and Sexual Activity  . Alcohol use: Yes    Alcohol/week: 1.2 oz    Types: 2 Cans of beer per week    Comment:  "couple beers q weekend"  . Drug use: Yes    Types: Cocaine    Comment: "stopped back in the 1980's"  . Sexual activity: Yes    Birth control/protection: Post-menopausal  Other Topics Concern  . None  Social History Narrative  . None   Depression screen [facility] 2/9 07/19/2017 12/09/2016 01/20/2016 06/11/2015 03/22/2015  Decreased Interest 0 0 0 0 0  Down, Depressed, Hopeless 0 0 0 0 0  PHQ - 2 Score 0 0 0 0 0  Some recent data might be hidden    Review of Systems  Constitutional: Negative for chills and fever.  Eyes: Negative for pain, redness and itching.  Respiratory: Negative for cough and shortness of breath.   Gastrointestinal: Negative for nausea and vomiting.  Musculoskeletal: Positive for back pain.       Foot pain  Neurological: Negative for speech difficulty.      Objective:   Physical Exam  Constitutional: She is oriented to person, place, and time. She appears well-developed and well-nourished. No distress.  HENT:  Head: Normocephalic and atraumatic.  Right Ear: External ear normal.  Left Ear: External ear  normal.  Eyes: Conjunctivae are normal. No scleral icterus.  Neck: Normal range of motion. Neck supple. No thyromegaly present.  Cardiovascular: Normal rate, regular rhythm, S1 normal, S2 normal, normal heart sounds and intact distal pulses.  No murmur heard. Pulmonary/Chest: Effort normal and breath sounds normal. No respiratory distress.  Musculoskeletal: She exhibits no edema.  Lymphadenopathy:    She has no cervical adenopathy.  Neurological: She is alert and oriented to person, place, and time.  Skin: Skin is warm and dry. She is not diaphoretic. No erythema.  Psychiatric: She has a normal mood and affect. Her behavior is normal.   Vitals:   07/19/17 0951  BP: 137/82  Pulse: 80  Resp: 16  Temp: 98.4 F (36.9 C)  SpO2: 98%  Weight: 145 lb 6.4 oz (66 kg)  Height: 5\' 4"  (1.626 m)       Assessment & Plan:   1. Essential hypertension - much improved but still slightly above goal on amlodipine 10 and clonidine 0.1 bid - pt states excellent compliance w/ clonidine and is again made aware of risks of CVA, MI, permanent kidney damage from rebound HTN if misses clonidine or accidentally goes off not under doctors guidance - she does not think she will have any problems w/ rigorous compliance. Had cough w/ lisinopril.  Add in low dose carvedilol bid for additional BP control, cont amlodipine 10 and clonidine 0.1 bid.  Recheck in 4-6 wks  2. Chronic kidney disease (CKD) stage G3b/A2, moderately decreased glomerular filtration rate (GFR) between 30-44 mL/min/1.73 square meter and albuminuria creatinine ratio between 30-299 mg/g (HCC) - pt has not heard from [facility] about nephrology referral yet. Reviewed importance of no nsaids and tight BP control to prevent any further damage.  3. Chronic hepatitis C without hepatic coma ([place]) - s/p treatment, RNA was negative 01/2017 and LFTs nml 06/2017 so has resolved.  4. Tobacco use disorder - has only smoked 1/4-1/3 ppd since 67 yo with occasional years  off when she quit so does not qualify for screening lung CT  5.      S/p lumbar spinal fusion 6.      Chronic pain syndrome - UDS appropriately negative 06/23/2016. Pt was referred to [facility] but missed their calls and her VM was full - gave pt their phone # so she can call to sched her appt.  No nsaids secondary to CKD. Continue gabaptenin 600mg  bid. Refilled tramadol - gave #30 only over last mo w/ no refills as was her first visit here.  Pt has been compliant w/ appropriate labs so increased tramadol to tid prn #90/mo.   Meds ordered this encounter  Medications  . traMADol (ULTRAM) 50 MG tablet    Sig: Take 1 tablet (50 mg total) by mouth every 8 (eight) hours as needed for moderate pain.    Dispense:  90 tablet    Refill:  0    For chronic pain  . gabapentin (NEURONTIN) 300 MG capsule    Sig: Take 2 capsules (600 mg total) by mouth 2 (two) times daily.    Dispense:  120 capsule    Refill:  2    Please consider 90 day supplies to promote better adherence  . carvedilol (COREG) 6.25 MG tablet    Sig: Take 1 tablet (6.25 mg total) by mouth 2 (two) times daily with a meal.    Dispense:  60 tablet    Refill:  1    I personally performed the services described in this documentation, which was scribed in my presence. The recorded information has been reviewed and considered, and addended by me as needed.   Delman Cheadle, M.D.  Primary Care at [facility] [address] [place], [place] [address] [phone] phone [phone] fax  07/20/17 10:07 PM

## 2017-07-19 NOTE — Patient Instructions (Addendum)
We sent your referral to [facility] in [place] who called you on 2/4 and 2/5. Their phone # is: [phone]    IF you received an x-ray today, you will receive an invoice from [facility]. Please contact [facility] at [phone] with questions or concerns regarding your invoice.   IF you received labwork today, you will receive an invoice from [facility]. Please contact [facility] at [phone] with questions or concerns regarding your invoice.   Our billing staff will not be able to assist you with questions regarding bills from these companies.  You will be contacted with the lab results as soon as they are available. The fastest way to get your results is to activate your My Chart account. Instructions are located on the last page of this paperwork. If you have not heard from [place] regarding the results in 2 weeks, please contact this office.      Chronic Kidney Disease, Adult Chronic kidney disease (CKD) happens when the kidneys are damaged during a time of 3 or more months. The kidneys are two organs that do many important jobs in the body. These jobs include:  Removing wastes and extra fluids from the blood.  Making hormones that maintain the amount of fluid in your tissues and blood vessels.  Making sure that the body has the right amount of fluids and chemicals.  Most of the time, this condition does not go away, but it can usually be controlled. Steps must be taken to slow down the kidney damage or stop it from getting worse. Otherwise, the kidneys may stop working. Follow these instructions at home:  Follow your diet as told by your doctor. You may need to avoid alcohol, salty foods (sodium), and foods that are high in potassium, calcium, and protein.  Take over-the-counter and prescription medicines only as told by your doctor. Do not take any new medicines unless your doctor says you can do that. These include vitamins and  minerals. ? Medicines and nutritional supplements can make kidney damage worse. ? Your doctor may need to change how much medicine you take.  Do not use any tobacco products. These include cigarettes, chewing tobacco, and e-cigarettes. If you need help quitting, ask your doctor.  Keep all follow-up visits as told by your doctor. This is important.  Check your blood pressure. Tell your doctor if there are changes to your blood pressure.  Get to a healthy weight. Stay at that weight. If you need help with this, ask your doctor.  Start or continue an exercise plan. Try to exercise at least 30 minutes a day, 5 days a week.  Stay up-to-date with your shots (immunizations) as told by your doctor. Contact a doctor if:  Your symptoms get worse.  You have new symptoms. Get help right away if:  You have symptoms of end-stage kidney disease. These include: ? Headaches. ? Skin that is darker or lighter than normal. ? Numbness in your hands or feet. ? Easy bruising. ? Having hiccups often. ? Chest pain. ? Shortness of breath. ? Stopping of menstrual periods in women.  You have a fever.  You are making very little pee (urine).  You have pain or bleeding when you pee (urinate). This information is not intended to replace advice given to you by your health care provider. Make sure you discuss any questions you have with your health care provider. Document Released: 08/12/2009 Document Revised: 10/24/2015 Document Reviewed: 01/15/2012 Elsevier Interactive Patient Education  2017 [employer].

## 2017-07-30 DIAGNOSIS — H40013 Open angle with borderline findings, low risk, bilateral: Secondary | ICD-10-CM | POA: Diagnosis not present

## 2017-07-30 DIAGNOSIS — H5203 Hypermetropia, bilateral: Secondary | ICD-10-CM | POA: Diagnosis not present

## 2017-08-03 DIAGNOSIS — D0511 Intraductal carcinoma in situ of right breast: Secondary | ICD-10-CM | POA: Diagnosis not present

## 2017-08-04 DIAGNOSIS — Z79899 Other long term (current) drug therapy: Secondary | ICD-10-CM | POA: Diagnosis not present

## 2017-08-04 DIAGNOSIS — G8929 Other chronic pain: Secondary | ICD-10-CM | POA: Diagnosis not present

## 2017-08-04 DIAGNOSIS — M545 Low back pain: Secondary | ICD-10-CM | POA: Diagnosis not present

## 2017-08-09 ENCOUNTER — Ambulatory Visit (INDEPENDENT_AMBULATORY_CARE_PROVIDER_SITE_OTHER): Payer: Medicare Other | Admitting: Podiatry

## 2017-08-09 DIAGNOSIS — M722 Plantar fascial fibromatosis: Secondary | ICD-10-CM

## 2017-08-09 NOTE — Patient Instructions (Signed)
Pre-Operative Instructions  Congratulations, you have decided to take an important step towards improving your quality of life.  You can be assured that the doctors and staff at [facility] will be with you every step of the way.  Here are some important things you should know:  1. Plan to be at the surgery center/hospital at least 1 (one) hour prior to your scheduled time, unless otherwise directed by the surgical center/hospital staff.  You must have a responsible adult accompany you, remain during the surgery and drive you home.  Make sure you have directions to the surgical center/hospital to ensure you arrive on time. 2. If you are having surgery at [facility] or [facility], you will need a copy of your medical history and physical form from your family physician within one month prior to the date of surgery. We will give you a form for your primary physician to complete.  3. We make every effort to accommodate the date you request for surgery.  However, there are times where surgery dates or times have to be moved.  We will contact you as soon as possible if a change in schedule is required.   4. No aspirin/ibuprofen for one week before surgery.  If you are on aspirin, any non-steroidal anti-inflammatory medications (Mobic, Aleve, Ibuprofen) should not be taken seven (7) days prior to your surgery.  You make take Tylenol for pain prior to surgery.  5. Medications - If you are taking daily heart and blood pressure medications, seizure, reflux, allergy, asthma, anxiety, pain or diabetes medications, make sure you notify the surgery center/hospital before the day of surgery so they can tell you which medications you should take or avoid the day of surgery. 6. No food or drink after midnight the night before surgery unless directed otherwise by surgical center/hospital staff. 7. No alcoholic beverages 24-hours prior to surgery.  No smoking 24-hours prior or 24-hours after  surgery. 8. Wear loose pants or shorts. They should be loose enough to fit over bandages, boots, and casts. 9. Don't wear slip-on shoes. Sneakers are preferred. 10. Bring your boot with you to the surgery center/hospital.  Also bring crutches or a walker if your physician has prescribed it for you.  If you do not have this equipment, it will be provided for you after surgery. 11. If you have not been contacted by the surgery center/hospital by the day before your surgery, call to confirm the date and time of your surgery. 12. Leave-time from work may vary depending on the type of surgery you have.  Appropriate arrangements should be made prior to surgery with your employer. 13. Prescriptions will be provided immediately following surgery by your doctor.  Fill these as soon as possible after surgery and take the medication as directed. Pain medications will not be refilled on weekends and must be approved by the doctor. 14. Remove nail polish on the operative foot and avoid getting pedicures prior to surgery. 15. Wash the night before surgery.  The night before surgery wash the foot and leg well with water and the antibacterial soap provided. Be sure to pay special attention to beneath the toenails and in between the toes.  Wash for at least three (3) minutes. Rinse thoroughly with water and dry well with a towel.  Perform this wash unless told not to do so by your physician.  Enclosed: 1 Ice pack (please put in freezer the night before surgery)   1 Hibiclens skin cleaner     Pre-op instructions  If you have any questions regarding the instructions, please do not hesitate to call our office.  [place]: [address], [place], [place] [address] -- [phone]  [place]: [address] [place], [place] [address] -- [phone]  [place]: [address]  , [place] [address] -- [phone]  [place]: [address], [place], [place] [address] -- [phone]  Website:  [email or web address] 

## 2017-08-10 ENCOUNTER — Telehealth: Payer: Self-pay | Admitting: *Deleted

## 2017-08-10 NOTE — Telephone Encounter (Signed)
Pt called states she wants something for pain.

## 2017-08-10 NOTE — Telephone Encounter (Signed)
Pt states she was seen yesterday and can't get surgery until 09/09/2017, but she needs pain medication.

## 2017-08-10 NOTE — Progress Notes (Signed)
Subjective: 67 year old female presenting today for follow up evaluation of plantar fasciitis and a fibroma of the right foot. She reports continued pain that has not changed since her previous visit. She does state that the swelling has decreased. She is interested in discussing surgical intervention at this time. Patient is here for further evaluation and treatment.   Past Medical History:  Diagnosis Date  . Atelectasis 2002   Bilateral  . Bone spur 2008   Right calcaneal foot spur  . Breast cancer ([place]) 2004   Ductal carcinoma in situ of the left breast; S/P left partial mastectomy 02/26/2003; S/P re-excision of cranial and lateral margins11/18/2004.radiation  . Breast cancer ([place]) 09/21/2012   right breast/ last radiation treatment 03/22/2013  . Chronic kidney disease, stage IV (severe) ([place]) 10/10/2007  . 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 Autumn Messing; S/P re-excision of superior and inferior margins 10/27/2012.   Marland Kitchen GERD (gastroesophageal reflux disease)   . Hepatitis C   . Hot flashes   . Hx of radiation therapy 2005   left breast  . Hx of radiation therapy 01/11/13- 03/22/13   right breast 5760 cGy 30 sessions  . Hypertension   . Low back pain   . Lumbar spinal stenosis    S/P lumbar decompressive laminectomy, fusion and plating for lumbar spinal stensosis  . Normocytic anemia    With thrombocytosis  . Osteoarthritis   . Personal history of radiation therapy   . Right ureteral stone 2002  . Shortness of breath    from pain  . Uterine fibroid   . Wears dentures    top     Objective: Physical Exam General: The patient is alert and oriented x3 in no acute distress.  Dermatology: Skin is warm, dry and supple bilateral lower extremities. Negative for open lesions or macerations bilateral.   Vascular: Dorsalis Pedis and Posterior Tibial pulses palpable bilateral.  Capillary fill time is immediate to all  digits.  Neurological: Epicritic and protective threshold intact bilateral.   Musculoskeletal: Tenderness to palpation at the medial calcaneal tubercale and through the insertion of the plantar fascia of the right foot. All other joints range of motion within normal limits bilateral. Strength 5/5 in all groups bilateral.  There is also a palpable nodule along the medial longitudinal arch of the right foot consistent with a plantar fibroma  Assessment: 1. Plantar fasciitis right - unchanged 2. Pain in right foot 3. Plantar fibroma right - unchanged  Plan of Care:  1. Patient evaluated.  2. Today we discussed the conservative versus surgical management of the presenting pathology. The patient opts for surgical management. All possible complications and details of the procedure were explained. All patient questions were answered. No guarantees were expressed or implied. 3. Authorization for surgery was initiated today. Surgery will consist of excision of plantar fibroma of the right foot.  4. Post op shoe dispensed.  5. Return to clinic 1 week post op.     Edrick Kins, DPM [facility]  Dr. Edrick Kins, DPM    [address], [place] [address]  Office [phone]  Fax [phone]

## 2017-08-11 MED ORDER — TRAMADOL HCL 50 MG PO TABS: 50.0000 mg | ORAL_TABLET | Freq: Three times a day (TID) | ORAL | 0 refills | Status: DC | PRN

## 2017-08-11 NOTE — Telephone Encounter (Signed)
Left message informing pt the Tramadol would be faxed to the [facility]. Faxed tramadol to [facility].

## 2017-08-11 NOTE — Telephone Encounter (Signed)
-----   Message from Edrick Kins, DPM sent at 08/10/2017  5:47 PM EDT ----- Regarding: Okay to fill tramadol Okay to fill tramadol. Thanks, Dr. Amalia Hailey

## 2017-08-12 ENCOUNTER — Telehealth: Payer: Self-pay | Admitting: Podiatry

## 2017-08-12 NOTE — Telephone Encounter (Signed)
I informed pt the tramadol had been sento the [facility] yesterday and I had left her a message yesterday morning. Pt states understanding.

## 2017-08-12 NOTE — Telephone Encounter (Signed)
I saw Dr. Amalia Hailey Monday and I was scheduled for foot surgery on 11 April. I was wanting to know if he could prescribe me some pain medication? My foot is stinging and hurting real bad every time I stand up and/or walk on it. You can call me back at [phone]. Thank you.

## 2017-08-13 ENCOUNTER — Telehealth: Payer: Self-pay | Admitting: *Deleted

## 2017-08-13 NOTE — Telephone Encounter (Signed)
-----   Message from Edrick Kins, DPM sent at 08/13/2017  2:44 PM EDT ----- Regarding: No more pain meds Pharmacy just called and patient is getting tramadol from another doc. No more pain meds.   Thanks, Dr. Amalia Hailey

## 2017-08-16 ENCOUNTER — Other Ambulatory Visit: Payer: Self-pay

## 2017-08-16 ENCOUNTER — Encounter: Payer: Self-pay | Admitting: Family Medicine

## 2017-08-16 ENCOUNTER — Ambulatory Visit (INDEPENDENT_AMBULATORY_CARE_PROVIDER_SITE_OTHER): Payer: Medicare Other | Admitting: Family Medicine

## 2017-08-16 VITALS — BP 134/84 | HR 68 | Temp 97.6°F | Resp 18 | Ht 64.0 in | Wt 145.4 lb

## 2017-08-16 DIAGNOSIS — M545 Low back pain: Secondary | ICD-10-CM

## 2017-08-16 DIAGNOSIS — G8929 Other chronic pain: Secondary | ICD-10-CM

## 2017-08-16 DIAGNOSIS — Z9119 Patient's noncompliance with other medical treatment and regimen: Secondary | ICD-10-CM | POA: Diagnosis not present

## 2017-08-16 DIAGNOSIS — N1832 Chronic kidney disease, stage 3b: Secondary | ICD-10-CM

## 2017-08-16 DIAGNOSIS — I10 Essential (primary) hypertension: Secondary | ICD-10-CM | POA: Diagnosis not present

## 2017-08-16 DIAGNOSIS — N183 Chronic kidney disease, stage 3 (moderate): Secondary | ICD-10-CM | POA: Diagnosis not present

## 2017-08-16 DIAGNOSIS — Z91199 Patient's noncompliance with other medical treatment and regimen due to unspecified reason: Secondary | ICD-10-CM

## 2017-08-16 LAB — CBC WITH DIFFERENTIAL/PLATELET
BASOS: 0 %
Basophils Absolute: 0 10*3/uL (ref 0.0–0.2)
EOS (ABSOLUTE): 0.2 10*3/uL (ref 0.0–0.4)
Eos: 4 %
HEMATOCRIT: 39.4 % (ref 34.0–46.6)
HEMOGLOBIN: 12.5 g/dL (ref 11.1–15.9)
IMMATURE GRANS (ABS): 0 10*3/uL (ref 0.0–0.1)
IMMATURE GRANULOCYTES: 0 %
Lymphocytes Absolute: 2.3 10*3/uL (ref 0.7–3.1)
Lymphs: 43 %
MCH: 28.6 pg (ref 26.6–33.0)
MCHC: 31.7 g/dL (ref 31.5–35.7)
MCV: 90 fL (ref 79–97)
MONOCYTES: 6 %
MONOS ABS: 0.3 10*3/uL (ref 0.1–0.9)
NEUTROS PCT: 47 %
Neutrophils Absolute: 2.4 10*3/uL (ref 1.4–7.0)
Platelets: 410 10*3/uL — ABNORMAL HIGH (ref 150–379)
RBC: 4.37 x10E6/uL (ref 3.77–5.28)
RDW: 15.4 % (ref 12.3–15.4)
WBC: 5.3 10*3/uL (ref 3.4–10.8)

## 2017-08-16 LAB — COMPREHENSIVE METABOLIC PANEL
ALBUMIN: 4.2 g/dL (ref 3.6–4.8)
ALT: 10 IU/L (ref 0–32)
AST: 13 IU/L (ref 0–40)
Albumin/Globulin Ratio: 1.4 (ref 1.2–2.2)
Alkaline Phosphatase: 87 IU/L (ref 39–117)
BUN/Creatinine Ratio: 12 (ref 12–28)
BUN: 17 mg/dL (ref 8–27)
Bilirubin Total: <0.2 mg/dL (ref 0.0–1.2)
CO2: 26 mmol/L (ref 20–29)
Calcium: 9.8 mg/dL (ref 8.7–10.3)
Chloride: 102 mmol/L (ref 96–106)
Creatinine, Ser: 1.46 mg/dL — ABNORMAL HIGH (ref 0.57–1.00)
GFR calc Af Amer: 44 mL/min/{1.73_m2} — ABNORMAL LOW (ref >59)
GFR, EST NON AFRICAN AMERICAN: 38 mL/min/{1.73_m2} — AB (ref >59)
GLOBULIN, TOTAL: 2.9 g/dL (ref 1.5–4.5)
GLUCOSE: 136 mg/dL — AB (ref 65–99)
Potassium: 4 mmol/L (ref 3.5–5.2)
SODIUM: 145 mmol/L — AB (ref 134–144)
Total Protein: 7.1 g/dL (ref 6.0–8.5)

## 2017-08-16 MED ORDER — TRAMADOL HCL 50 MG PO TABS: 50.0000 mg | ORAL_TABLET | Freq: Three times a day (TID) | ORAL | 0 refills | Status: DC | PRN

## 2017-08-16 MED ORDER — GABAPENTIN 300 MG PO CAPS
600.0000 mg | ORAL_CAPSULE | Freq: Three times a day (TID) | ORAL | 2 refills | Status: DC
Start: 2017-08-16 — End: 2017-12-22

## 2017-08-16 MED ORDER — PREDNISONE 20 MG PO TABS: ORAL_TABLET | ORAL | 0 refills | Status: DC

## 2017-08-16 MED ORDER — CLONIDINE HCL 0.2 MG PO TABS: 0.2000 mg | ORAL_TABLET | Freq: Two times a day (BID) | ORAL | 0 refills | Status: DC

## 2017-08-16 MED ORDER — CARVEDILOL 12.5 MG PO TABS: 12.5000 mg | ORAL_TABLET | Freq: Two times a day (BID) | ORAL | 0 refills | Status: DC

## 2017-08-16 MED ORDER — AMLODIPINE BESYLATE 10 MG PO TABS: 10.0000 mg | ORAL_TABLET | Freq: Every day | ORAL | 0 refills | Status: DC

## 2017-08-16 NOTE — Patient Instructions (Addendum)
Please call [place] for an appointment to address your severe stage 3 chronic kidney disease and erratic blood pressure. ? [address], [place], [place] [address] ? Phone: [phone]   IF you received an x-ray today, you will receive an invoice from [facility]. Please contact [facility] at [phone] with questions or concerns regarding your invoice.   IF you received labwork today, you will receive an invoice from [facility]. Please contact [facility] at [phone] with questions or concerns regarding your invoice.   Our billing staff will not be able to assist you with questions regarding bills from these companies.  You will be contacted with the lab results as soon as they are available. The fastest way to get your results is to activate your My Chart account. Instructions are located on the last page of this paperwork. If you have not heard from [place] regarding the results in 2 weeks, please contact this office.     Managing Your Hypertension Hypertension is commonly called high blood pressure. This is when the force of your blood pressing against the walls of your arteries is too strong. Arteries are blood vessels that carry blood from your heart throughout your body. Hypertension forces the heart to work harder to pump blood, and may cause the arteries to become narrow or stiff. Having untreated or uncontrolled hypertension can cause heart attack, stroke, kidney disease, and other problems. What are blood pressure readings? A blood pressure reading consists of a higher number over a lower number. Ideally, your blood pressure should be below 120/80. The first ("top") number is called the systolic pressure. It is a measure of the pressure in your arteries as your heart beats. The second ("bottom") number is called the diastolic pressure. It is a measure of the pressure in your arteries as the heart relaxes. What does my blood pressure reading mean? Blood  pressure is classified into four stages. Based on your blood pressure reading, your health care provider may use the following stages to determine what type of treatment you need, if any. Systolic pressure and diastolic pressure are measured in a unit called mm Hg. Normal  Systolic pressure: below 166.  Diastolic pressure: below 80. Elevated  Systolic pressure: 063-016.  Diastolic pressure: below 80. Hypertension stage 1  Systolic pressure: 010-932.  Diastolic pressure: 35-57. Hypertension stage 2  Systolic pressure: 322 or above.  Diastolic pressure: 90 or above. What health risks are associated with hypertension? Managing your hypertension is an important responsibility. Uncontrolled hypertension can lead to:  A heart attack.  A stroke.  A weakened blood vessel (aneurysm).  Heart failure.  Kidney damage.  Eye damage.  Metabolic syndrome.  Memory and concentration problems.  What changes can I make to manage my hypertension? Hypertension can be managed by making lifestyle changes and possibly by taking medicines. Your health care provider will help you make a plan to bring your blood pressure within a normal range. Eating and drinking  Eat a diet that is high in fiber and potassium, and low in salt (sodium), added sugar, and fat. An example eating plan is called the DASH (Dietary Approaches to Stop Hypertension) diet. To eat this way: ? Eat plenty of fresh fruits and vegetables. Try to fill half of your plate at each meal with fruits and vegetables. ? Eat whole grains, such as whole wheat pasta, brown rice, or whole grain bread. Fill about one quarter of your plate with whole grains. ? Eat low-fat diary products. ? Avoid  fatty cuts of meat, processed or cured meats, and poultry with skin. Fill about one quarter of your plate with lean proteins such as fish, chicken without skin, beans, eggs, and tofu. ? Avoid premade and processed foods. These tend to be higher in  sodium, added sugar, and fat.  Reduce your daily sodium intake. Most people with hypertension should eat less than 1,500 mg of sodium a day.  Limit alcohol intake to no more than 1 drink a day for nonpregnant women and 2 drinks a day for men. One drink equals 12 oz of beer, 5 oz of wine, or 1 oz of hard liquor. Lifestyle  Work with your health care provider to maintain a healthy body weight, or to lose weight. Ask what an ideal weight is for you.  Get at least 30 minutes of exercise that causes your heart to beat faster (aerobic exercise) most days of the week. Activities may include walking, swimming, or biking.  Include exercise to strengthen your muscles (resistance exercise), such as weight lifting, as part of your weekly exercise routine. Try to do these types of exercises for 30 minutes at least 3 days a week.  Do not use any products that contain nicotine or tobacco, such as cigarettes and e-cigarettes. If you need help quitting, ask your health care provider.  Control any long-term (chronic) conditions you have, such as high cholesterol or diabetes. Monitoring  Monitor your blood pressure at home as told by your health care provider. Your personal target blood pressure may vary depending on your medical conditions, your age, and other factors.  Have your blood pressure checked regularly, as often as told by your health care provider. Working with your health care provider  Review all the medicines you take with your health care provider because there may be side effects or interactions.  Talk with your health care provider about your diet, exercise habits, and other lifestyle factors that may be contributing to hypertension.  Visit your health care provider regularly. Your health care provider can help you create and adjust your plan for managing hypertension. Will I need medicine to control my blood pressure? Your health care provider may prescribe medicine if lifestyle changes  are not enough to get your blood pressure under control, and if:  Your systolic blood pressure is 130 or higher.  Your diastolic blood pressure is 80 or higher.  Take medicines only as told by your health care provider. Follow the directions carefully. Blood pressure medicines must be taken as prescribed. The medicine does not work as well when you skip doses. Skipping doses also puts you at risk for problems. Contact a health care provider if:  You think you are having a reaction to medicines you have taken.  You have repeated (recurrent) headaches.  You feel dizzy.  You have swelling in your ankles.  You have trouble with your vision. Get help right away if:  You develop a severe headache or confusion.  You have unusual weakness or numbness, or you feel faint.  You have severe pain in your chest or abdomen.  You vomit repeatedly.  You have trouble breathing. Summary  Hypertension is when the force of blood pumping through your arteries is too strong. If this condition is not controlled, it may put you at risk for serious complications.  Your personal target blood pressure may vary depending on your medical conditions, your age, and other factors. For most people, a normal blood pressure is less than 120/80.  Hypertension is  managed by lifestyle changes, medicines, or both. Lifestyle changes include weight loss, eating a healthy, low-sodium diet, exercising more, and limiting alcohol. This information is not intended to replace advice given to you by your health care provider. Make sure you discuss any questions you have with your health care provider. Document Released: 02/10/2012 Document Revised: 04/15/2016 Document Reviewed: 04/15/2016 Elsevier Interactive Patient Education  [employer].

## 2017-08-16 NOTE — Progress Notes (Signed)
Subjective:  By signing my name below, I, Monique Henderson, attest that this documentation has been prepared under the direction and in the presence of Delman Cheadle, MD. Electronically Signed: [name], [place]. 08/16/2017 , 9:49 AM .  Patient was seen in Room 1 .   Patient ID: Monique Henderson, female    DOB: 09-May-1951, 67 y.o.   MRN: [id number] Chief Complaint  Patient presents with  . Hypertension  . Chronic Kidney Disease  . Pain Management    Pt states she is still in a lot of pain. Pt states her neck, back and left hip hurt. Pt states tramadol isn't doing anything to help with the pain.  . Follow-up   HPI Monique Henderson is a 66 y.o. female who presents to Primary Care at [facility] for follow up.   Pain management Patient states she went to pain management at [facility] on [address] in [place]. She had labs drawn and was placed on another medication, Baclofen 10mg  (by Dr. Jeanella Anton), which patient notes she didn't like. She had an appointment 2 days ago to return, but upon arriving at their office, she was told that her labs weren't back yet, and have her return another day. She reports gabapentin gives her some relief, taking it twice a day. She denies relief with cyclobenzaprine 10mg .   Leg pain: similar pain as before.   HTN She brought in her cuff in today. Her average BP has been 162/90. Her last few readings have been: 174/100, 150s/90s, 160/110, 170/100, 197/95, 212/80, and 136/80. She is currently taking carvedilol 6.25mg  BID. She denies side effects from her clonidine.   Chronic kidney disease She denies receiving call from [facility] yet.   Foot surgery She's going to have foot surgery to have excision of plantar fibroma of the right foot, done by her podiatrist, Dr. Amalia Hailey, on April 11th.   Past Medical History:  Diagnosis Date  . Atelectasis 2002   Bilateral  . Bone spur 2008   Right calcaneal foot spur  . Breast cancer ([place]) 2004    Ductal carcinoma in situ of the left breast; S/P left partial mastectomy 02/26/2003; S/P re-excision of cranial and lateral margins11/18/2004.radiation  . Breast cancer ([place]) 09/21/2012   right breast/ last radiation treatment 03/22/2013  . Chronic kidney disease, stage IV (severe) ([place]) 10/10/2007  . 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 Autumn Messing; S/P re-excision of superior and inferior margins 10/27/2012.   Marland Kitchen GERD (gastroesophageal reflux disease)   . Hepatitis C   . Hot flashes   . Hx of radiation therapy 2005   left breast  . Hx of radiation therapy 01/11/13- 03/22/13   right breast 5760 cGy 30 sessions  . Hypertension   . Low back pain   . Lumbar spinal stenosis    S/P lumbar decompressive laminectomy, fusion and plating for lumbar spinal stensosis  . Normocytic anemia    With thrombocytosis  . Osteoarthritis   . Personal history of radiation therapy   . Right ureteral stone 2002  . Shortness of breath    from pain  . Uterine fibroid   . Wears dentures    top   Past Surgical History:  Procedure Laterality Date  . ANTERIOR LAT LUMBAR FUSION N/A 01/18/2014   Procedure: ANTERIOR LATERAL LUMBAR FUSION LUMBAR TWO-THREE;  Surgeon: Eustace Moore, MD;  Location: [facility];  Service: Neurosurgery;  Laterality: N/A;  .  ANTERIOR LUMBAR FUSION  01/18/2014  . BACK SURGERY    . BREAST LUMPECTOMY Left 01/2003  . BREAST LUMPECTOMY Right 2014  . BREAST LUMPECTOMY WITH NEEDLE LOCALIZATION AND AXILLARY SENTINEL LYMPH NODE BX Right 10/13/2012   Procedure: BREAST LUMPECTOMY WITH NEEDLE LOCALIZATION;  Surgeon: [name], MD;  Location: [facility];  Service: General;  Laterality: Right;  Right breast wire localized lumpectomy  . LAMINECTOMY  05/27/2009   Lumbar decompressive laminectomy, fusion and plating for lumbar spinal stensosis  . LUMBAR LAMINECTOMY/DECOMPRESSION MICRODISCECTOMY Left 03/23/2013   Procedure:  LUMBAR LAMINECTOMY/DECOMPRESSION MICRODISCECTOMY 1 LEVEL;  Surgeon: Eustace Moore, MD;  Location: [facility];  Service: Neurosurgery;  Laterality: Left;  LUMBAR LAMINECTOMY/DECOMPRESSION MICRODISCECTOMY 1 LEVEL  . MASTECTOMY, PARTIAL Left 02/26/2003   ; S/P re-excision of cranial and lateral margins 04/19/2003.   Marland Kitchen RE-EXCISION OF BREAST CANCER,SUPERIOR MARGINS Right 10/27/2012   Procedure: RE-EXCISION OF BREAST CANCER,SUPERIOR and inferior MARGINS;  Surgeon: [name], MD;  Location: [name];  Service: General;  Laterality: Right;  . RE-EXCISION OF BREAST LUMPECTOMY Left 04/2003   Prior to Admission medications   Medication Sig Start Date End Date Taking? Authorizing Provider  albuterol (PROAIR HFA) 108 (90 Base) MCG/ACT inhaler INHALE TWO PUFFS INTO LUNGS EVERY 4 HOURS AS NEEDED FOR WHEEZING FOR COUGH FOR SHORTNESS OF BREATH 06/23/17   [name], MD  amLODipine (NORVASC) 10 MG tablet Take 1/2 tabs po qd x 1 wk, then 1 tab po qd 06/23/17   [name], MD  carvedilol (COREG) 6.25 MG tablet Take 1 tablet (6.25 mg total) by mouth 2 (two) times daily with a meal. 07/19/17   [name], MD  cloNIDine (CATAPRES) 0.1 MG tablet Take 1 tablet (0.1 mg total) by mouth 2 (two) times daily. 06/23/17   [name], MD  famotidine (PEPCID) 20 MG tablet Take 1 tablet (20 mg total) by mouth at bedtime. 06/23/17   [name], MD  gabapentin (NEURONTIN) 300 MG capsule Take 2 capsules (600 mg total) by mouth 2 (two) times daily. 07/19/17   [name], MD  traMADol (ULTRAM) 50 MG tablet Take 1 tablet (50 mg total) by mouth every 8 (eight) hours as needed for moderate pain. 07/19/17   [name], MD  traMADol (ULTRAM) 50 MG tablet Take 1 tablet (50 mg total) by mouth every 8 (eight) hours as needed. 08/11/17   Edrick Kins, DPM  lisinopril (PRINIVIL,ZESTRIL) 10 MG tablet Take 1 tablet (10 mg total) by mouth daily. 04/20/12 07/12/12  Bertha Stakes, MD   Allergies  Allergen Reactions  . Shrimp [Shellfish Allergy]  Shortness Of Breath  . Bactroban [Mupirocin] Other (See Comments)    "Sores in nose"  . Vicodin [Hydrocodone-Acetaminophen] Itching and Nausea And Vomiting    This is patient's home medication  . Lisinopril Cough  . Tylenol [Acetaminophen] Itching   Family History  Problem Relation Age of Onset  . Colon cancer Mother 54  . Hypertension Mother   . Diabetes Sister 78  . Hypertension Sister   . Diabetes Brother   . Hypertension Brother   . Diabetes Brother   . Hypertension Brother   . Kidney disease Son        On dialysis  . Hypertension Son   . Diabetes Son   . Multiple sclerosis Son   . Bone cancer Sister 64  . Breast cancer Neg Hx   . Cervical cancer Neg Hx  Social History   Socioeconomic History  . Marital status: Single    Spouse name: None  . Number of children: None  . Years of education: None  . Highest education level: None  Social Needs  . Financial resource strain: None  . Food insecurity - worry: None  . Food insecurity - inability: None  . Transportation needs - medical: None  . Transportation needs - non-medical: None  Occupational History  . None  Tobacco Use  . Smoking status: Current Some Day Smoker    Packs/day: 0.25    Years: 44.00    Pack years: 11.00    Types: Cigarettes  . Smokeless tobacco: Never Used  . Tobacco comment: smoking less  Substance and Sexual Activity  . Alcohol use: Yes    Alcohol/week: 1.2 oz    Types: 2 Cans of beer per week    Comment:  "couple beers q weekend"  . Drug use: Yes    Types: Cocaine    Comment: "stopped back in the 1980's"  . Sexual activity: Yes    Birth control/protection: Post-menopausal  Other Topics Concern  . None  Social History Narrative  . None   Depression screen [facility] 2/9 08/16/2017 07/19/2017 12/09/2016 01/20/2016 06/11/2015  Decreased Interest 0 0 0 0 0  Down, Depressed, Hopeless 0 0 0 0 0  PHQ - 2 Score 0 0 0 0 0  Some recent data might be hidden    Review of Systems  Constitutional:  Negative for chills, fatigue, fever and unexpected weight change.  Respiratory: Negative for cough.   Gastrointestinal: Negative for constipation, diarrhea, nausea and vomiting.  Musculoskeletal: Positive for arthralgias (left hip), back pain and neck pain.  Skin: Negative for rash and wound.  Neurological: Negative for dizziness, weakness and headaches.       Objective:   Physical Exam  Constitutional: She is oriented to person, place, and time. She appears well-developed and well-nourished. No distress.  HENT:  Head: Normocephalic and atraumatic.  Eyes: EOM are normal. Pupils are equal, round, and reactive to light.  Neck: Neck supple.  Cardiovascular: Normal rate.  Pulmonary/Chest: Effort normal. No respiratory distress.  Musculoskeletal: Normal range of motion.  Neurological: She is alert and oriented to person, place, and time.  Skin: Skin is warm and dry.  Psychiatric: She has a normal mood and affect. Her behavior is normal.  Nursing note and vitals reviewed.   BP 134/84 (BP Location: Left Arm, Patient Position: Sitting, Cuff Size: Normal)   Pulse 68   Temp 97.6 F (36.4 C) (Oral)   Resp 18   Ht 5\' 4"  (1.626 m)   Wt 145 lb 6.4 oz (66 kg)   SpO2 100%   BMI 24.96 kg/m    [9:30AM] Using her cuff from home, measured in office today, left arm: 172/102 [9:33AM] BP recheck in room, right arm, manual: 160/100 [9:34AM] BP recheck in room, left arm, manual: 156/98 [9:36AM] Using her cuff from home, measured in office today, left arm: 139/83     Assessment & Plan:  NCCSRS reviewed, last filled tramadol 50mg  #90 2/18 from myself.   1. Chronic kidney disease (CKD) stage G3b/A2, moderately decreased glomerular filtration rate (GFR) between 30-44 mL/min/1.73 square meter and albuminuria creatinine ratio between 30-299 mg/g [facility]) - never scheduled with nephrology though referred at last OV 06/23/17 - gave pt [facility] office # so she can call to get her appt sched  2. Essential  hypertension - will appreciate nephrology assistance for control; cont  amlodipine 10, increase carvedilol from 6.25 bid to 12.5 bid, increase clonidine 0.1 bid to 0.2 bid.   3. Chronic bilateral low back pain without sciatica - increase gabapentin from 600 bid to tid. Refilled tramadol. ADDENDUM - referred to pain management at last visit 06/23/17 - started seeing Jeanella Anton, NP at [facility].  4. Non compliance with medical treatment - again reviewed importance of daily med compliance or else very limited life expectancy due to progression of renal failure relatively quickly over past yr    Orders Placed This Encounter  Procedures  . Comprehensive metabolic panel  . CBC with Differential/Platelet    Meds ordered this encounter  Medications  . carvedilol (COREG) 12.5 MG tablet    Sig: Take 1 tablet (12.5 mg total) by mouth 2 (two) times daily with a meal.    Dispense:  180 tablet    Refill:  0  . cloNIDine (CATAPRES) 0.2 MG tablet    Sig: Take 1 tablet (0.2 mg total) by mouth 2 (two) times daily.    Dispense:  180 tablet    Refill:  0  . gabapentin (NEURONTIN) 300 MG capsule    Sig: Take 2 capsules (600 mg total) by mouth 3 (three) times daily.    Dispense:  180 capsule    Refill:  2    Pt does not need currently. Please add this on as refills to current rx and note sig change  . amLODipine (NORVASC) 10 MG tablet    Sig: Take 1 tablet (10 mg total) by mouth daily.    Dispense:  90 tablet    Refill:  0    Pt does not need currently. Please change the sig to above on current rx and add this on as refills to current rx.  . predniSONE (DELTASONE) 20 MG tablet    Sig: Take 3 tabs qd x 3d, then 2 tabs qd x 3d then 1 tab qd x 3d.    Dispense:  18 tablet    Refill:  0  . traMADol (ULTRAM) 50 MG tablet    Sig: Take 1-2 tablets (50-100 mg total) by mouth every 8 (eight) hours as needed for moderate pain.    Dispense:  180 tablet    Refill:  0    For  chronic pain   Today I have utilized the [place] Controlled Substance Registry's online query to confirm compliance regarding the patient's controlled medications. My review reveals appropriate prescription fills and that I am the sole provider of these medications. Rechecks will occur regularly and the patient is aware of our use of the system.   I personally performed the services described in this documentation, which was scribed in my presence. The recorded information has been reviewed and considered, and addended by me as needed.   Delman Cheadle, M.D.  Primary Care at [facility] [address] [place], [place] [address] [phone] phone [phone] fax  12/04/17 3:04 AM

## 2017-08-27 DIAGNOSIS — G8929 Other chronic pain: Secondary | ICD-10-CM | POA: Diagnosis not present

## 2017-08-27 DIAGNOSIS — M545 Low back pain: Secondary | ICD-10-CM | POA: Diagnosis not present

## 2017-08-27 DIAGNOSIS — Z79899 Other long term (current) drug therapy: Secondary | ICD-10-CM | POA: Diagnosis not present

## 2017-09-09 ENCOUNTER — Telehealth: Payer: Self-pay | Admitting: *Deleted

## 2017-09-09 NOTE — Telephone Encounter (Addendum)
"  I went to the wrong place this morning for my surgery.  I went up to the desk finally and she told me I was at the wrong place.  I need to reschedule my surgery."  So you came to the [place] thinking your surgery was here?  "Yes, I did.  I finally went to the front desk and they told me I was at the wrong place.  I see now where I was supposed to be.  I am looking at the brochure, I was supposed to go to [facility]."  He can do it on April 25 or May 2.  "Let's do it on April 25.  Will I need to be there at the same time?"  Someone from the surgical center will call you with the arrival time a day or two before.    I called and left a message for Caren Griffins at [facility] to reschedule the patient to April 25.  (The patient went to the surgery center on [address])

## 2017-09-15 ENCOUNTER — Encounter: Payer: Medicare Other | Admitting: Podiatry

## 2017-09-15 NOTE — Progress Notes (Signed)
DOS: 09-09-17 Excision of Plantar Fibroma right foot

## 2017-09-16 ENCOUNTER — Ambulatory Visit: Payer: Medicare Other | Admitting: Family Medicine

## 2017-09-22 ENCOUNTER — Encounter: Payer: Medicare Other | Admitting: Podiatry

## 2017-09-23 ENCOUNTER — Telehealth: Payer: Self-pay | Admitting: *Deleted

## 2017-09-23 DIAGNOSIS — D492 Neoplasm of unspecified behavior of bone, soft tissue, and skin: Secondary | ICD-10-CM | POA: Diagnosis not present

## 2017-09-23 NOTE — Telephone Encounter (Signed)
Monique Henderson - [facility] states pt called and is having bleeding from the bottom of her surgery foot.

## 2017-09-23 NOTE — Telephone Encounter (Signed)
I spoke with pt and she states she has blood over the bottom of her foot.

## 2017-09-24 ENCOUNTER — Other Ambulatory Visit: Payer: Self-pay | Admitting: Family Medicine

## 2017-09-24 ENCOUNTER — Ambulatory Visit (INDEPENDENT_AMBULATORY_CARE_PROVIDER_SITE_OTHER): Payer: Medicaid Other

## 2017-09-24 DIAGNOSIS — M722 Plantar fascial fibromatosis: Secondary | ICD-10-CM

## 2017-09-28 NOTE — Progress Notes (Signed)
Patient presents s/p plantar fibroma removal DOS 4.25.19. She is concerned that she has bled through the bandage and is worried that her stiches may have opened up.   Noted moderate amount of bright red blood on the plantar aspect of bandage and sock, there was blood present on post op shoe as well. Removed some of the dressing, looked under dressing at surgical site. All sutures were intact, no gapping or opening. No hematoma.   She states that she has been up on her foot a lot since surgery. I advised her to reduce activity and elevate. Resting and elevation will relieve the bleeding and pain. I informed her of the s/s of increased bleeding and to report this immediately. She is to follow up sooner if needed otherwise she is to keep her post op appt with Dr Amalia Hailey

## 2017-09-29 ENCOUNTER — Ambulatory Visit (INDEPENDENT_AMBULATORY_CARE_PROVIDER_SITE_OTHER): Payer: Medicaid Other | Admitting: Podiatry

## 2017-09-29 ENCOUNTER — Encounter: Payer: Self-pay | Admitting: Podiatry

## 2017-09-29 DIAGNOSIS — Z9889 Other specified postprocedural states: Secondary | ICD-10-CM

## 2017-09-29 DIAGNOSIS — M722 Plantar fascial fibromatosis: Secondary | ICD-10-CM

## 2017-09-29 MED ORDER — OXYCODONE-ACETAMINOPHEN 5-325 MG PO TABS: 1.0000 | ORAL_TABLET | Freq: Four times a day (QID) | ORAL | 0 refills | Status: DC | PRN

## 2017-09-29 MED ORDER — DOXYCYCLINE HYCLATE 100 MG PO TABS: 100.0000 mg | ORAL_TABLET | Freq: Two times a day (BID) | ORAL | 0 refills | Status: DC

## 2017-10-01 ENCOUNTER — Ambulatory Visit: Payer: Medicaid Other | Admitting: Family Medicine

## 2017-10-04 ENCOUNTER — Ambulatory Visit: Payer: Medicaid Other | Admitting: Family Medicine

## 2017-10-04 NOTE — Progress Notes (Signed)
   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 DOS: 09/23/17. She reports aching of the right foot. She states the incision looks well. She states she was seen by Janett Billow, RN the day after surgery due to bleeding through the dressing. Patient is here for further evaluation and treatment.    Past Medical History:  Diagnosis Date  . Atelectasis 2002   Bilateral  . Bone spur 2008   Right calcaneal foot spur  . Breast cancer ([place]) 2004   Ductal carcinoma in situ of the left breast; S/P left partial mastectomy 02/26/2003; S/P re-excision of cranial and lateral margins11/18/2004.radiation  . Breast cancer ([place]) 09/21/2012   right breast/ last radiation treatment 03/22/2013  . Chronic kidney disease, stage IV (severe) ([place]) 10/10/2007  . 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 Autumn Messing; S/P re-excision of superior and inferior margins 10/27/2012.   Marland Kitchen GERD (gastroesophageal reflux disease)   . Hepatitis C   . Hot flashes   . Hx of radiation therapy 2005   left breast  . Hx of radiation therapy 01/11/13- 03/22/13   right breast 5760 cGy 30 sessions  . Hypertension   . Low back pain   . Lumbar spinal stenosis    S/P lumbar decompressive laminectomy, fusion and plating for lumbar spinal stensosis  . Normocytic anemia    With thrombocytosis  . Osteoarthritis   . Personal history of radiation therapy   . Right ureteral stone 2002  . Shortness of breath    from pain  . Uterine fibroid   . Wears dentures    top      Objective/Physical Exam Neurovascular status intact.  Skin incisions appear to be well coapted with sutures and staples intact. No sign of infectious process noted. No dehiscence. No active bleeding noted. Moderate edema noted to the surgical extremity.   Assessment: 1. s/p plantar fibroma excision right foot. DOS: 09/23/17   Plan of Care:  1. Patient was evaluated.    2. Dressing changed. Keep clean, dry and intact for one week.  3. Continue wearing post op shoe. Weightbearing as tolerated.  4. Refill prescription for Percocet 5/325 mg #30 provided to patient.  5. Return to clinic in one week.    Edrick Kins, DPM [facility]  Dr. Edrick Kins, [address]                                        [place], [place] [address]                Office [phone]  Fax [phone]

## 2017-10-06 ENCOUNTER — Ambulatory Visit (INDEPENDENT_AMBULATORY_CARE_PROVIDER_SITE_OTHER): Payer: Medicaid Other | Admitting: Podiatry

## 2017-10-06 ENCOUNTER — Encounter: Payer: Self-pay | Admitting: Podiatry

## 2017-10-06 DIAGNOSIS — Z9889 Other specified postprocedural states: Secondary | ICD-10-CM

## 2017-10-06 DIAGNOSIS — M722 Plantar fascial fibromatosis: Secondary | ICD-10-CM

## 2017-10-11 NOTE — Progress Notes (Signed)
   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 DOS: 09/23/17. She states her incision looks well. She reports some numbness in the right hallux. She has been weightbearing in the post op shoe as directed. Patient is here for further evaluation and treatment.   Past Medical History:  Diagnosis Date  . Atelectasis 2002   Bilateral  . Bone spur 2008   Right calcaneal foot spur  . Breast cancer ([place]) 2004   Ductal carcinoma in situ of the left breast; S/P left partial mastectomy 02/26/2003; S/P re-excision of cranial and lateral margins11/18/2004.radiation  . Breast cancer ([place]) 09/21/2012   right breast/ last radiation treatment 03/22/2013  . Chronic kidney disease, stage IV (severe) ([place]) 10/10/2007  . 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 Autumn Messing; S/P re-excision of superior and inferior margins 10/27/2012.   Marland Kitchen GERD (gastroesophageal reflux disease)   . Hepatitis C   . Hot flashes   . Hx of radiation therapy 2005   left breast  . Hx of radiation therapy 01/11/13- 03/22/13   right breast 5760 cGy 30 sessions  . Hypertension   . Low back pain   . Lumbar spinal stenosis    S/P lumbar decompressive laminectomy, fusion and plating for lumbar spinal stensosis  . Normocytic anemia    With thrombocytosis  . Osteoarthritis   . Personal history of radiation therapy   . Right ureteral stone 2002  . Shortness of breath    from pain  . Uterine fibroid   . Wears dentures    top      Objective/Physical Exam Neurovascular status intact.  Skin incisions appear to be well coapted with sutures and staples intact. No sign of infectious process noted. No dehiscence. No active bleeding noted. Moderate edema noted to the surgical extremity.   Assessment: 1. s/p plantar fibroma excision right foot. DOS: 09/23/17   Plan of Care:  1. Patient was evaluated.  2. Dressing changed. Recommended  antibiotic ointment daily with a Band-Aid.  3. Continue weightbearing in post op shoe.  4. Return to clinic in one week for suture removal.    Edrick Kins, DPM [facility]  Dr. Edrick Kins, [address]                                        [place], [place] [address]                Office [phone]  Fax [phone]

## 2017-10-13 ENCOUNTER — Ambulatory Visit (INDEPENDENT_AMBULATORY_CARE_PROVIDER_SITE_OTHER): Payer: Medicaid Other | Admitting: Podiatry

## 2017-10-13 DIAGNOSIS — Z9889 Other specified postprocedural states: Secondary | ICD-10-CM

## 2017-10-13 DIAGNOSIS — M722 Plantar fascial fibromatosis: Secondary | ICD-10-CM

## 2017-10-17 NOTE — Progress Notes (Signed)
   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 DOS: 09/23/17. She reports a mild burning sensation with some associated itching of the foot. She states the foot feels better overall. Patient is here for further evaluation and treatment.   Past Medical History:  Diagnosis Date  . Atelectasis 2002   Bilateral  . Bone spur 2008   Right calcaneal foot spur  . Breast cancer ([place]) 2004   Ductal carcinoma in situ of the left breast; S/P left partial mastectomy 02/26/2003; S/P re-excision of cranial and lateral margins11/18/2004.radiation  . Breast cancer ([place]) 09/21/2012   right breast/ last radiation treatment 03/22/2013  . Chronic kidney disease, stage IV (severe) ([place]) 10/10/2007  . 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 Autumn Messing; S/P re-excision of superior and inferior margins 10/27/2012.   Marland Kitchen GERD (gastroesophageal reflux disease)   . Hepatitis C   . Hot flashes   . Hx of radiation therapy 2005   left breast  . Hx of radiation therapy 01/11/13- 03/22/13   right breast 5760 cGy 30 sessions  . Hypertension   . Low back pain   . Lumbar spinal stenosis    S/P lumbar decompressive laminectomy, fusion and plating for lumbar spinal stensosis  . Normocytic anemia    With thrombocytosis  . Osteoarthritis   . Personal history of radiation therapy   . Right ureteral stone 2002  . Shortness of breath    from pain  . Uterine fibroid   . Wears dentures    top      Objective/Physical Exam Neurovascular status intact.  Skin incisions appear to be well coapted with sutures and staples intact. No sign of infectious process noted. No dehiscence. No active bleeding noted. Moderate edema noted to the surgical extremity.   Assessment: 1. s/p plantar fibroma excision right foot. DOS: 09/23/17   Plan of Care:  1. Patient was evaluated.  2. Sutures removed.  3. Resume wearing good shoe  gear.  4. Discontinue wearing post op shoe.  5. Return to clinic in 3 weeks.    Edrick Kins, DPM [facility]  Dr. Edrick Kins, [address]                                        [place], [place] [address]                Office [phone]  Fax [phone]

## 2017-10-18 ENCOUNTER — Ambulatory Visit: Payer: Medicare HMO | Admitting: Family Medicine

## 2017-10-27 DIAGNOSIS — M5136 Other intervertebral disc degeneration, lumbar region: Secondary | ICD-10-CM | POA: Diagnosis not present

## 2017-10-27 DIAGNOSIS — Z79899 Other long term (current) drug therapy: Secondary | ICD-10-CM | POA: Diagnosis not present

## 2017-11-03 ENCOUNTER — Encounter: Payer: Medicaid Other | Admitting: Podiatry

## 2017-11-07 NOTE — Progress Notes (Signed)
This encounter was created in error - please disregard.

## 2017-11-08 ENCOUNTER — Ambulatory Visit (INDEPENDENT_AMBULATORY_CARE_PROVIDER_SITE_OTHER): Payer: Self-pay | Admitting: Podiatry

## 2017-11-08 DIAGNOSIS — M722 Plantar fascial fibromatosis: Secondary | ICD-10-CM

## 2017-11-08 DIAGNOSIS — Z9889 Other specified postprocedural states: Secondary | ICD-10-CM

## 2017-11-09 NOTE — Progress Notes (Signed)
   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 DOS: 09/23/17. She states she is doing much better. She states she is able to walk better now. Patient is here for further evaluation and treatment.   Past Medical History:  Diagnosis Date  . Atelectasis 2002   Bilateral  . Bone spur 2008   Right calcaneal foot spur  . Breast cancer ([place]) 2004   Ductal carcinoma in situ of the left breast; S/P left partial mastectomy 02/26/2003; S/P re-excision of cranial and lateral margins11/18/2004.radiation  . Breast cancer ([place]) 09/21/2012   right breast/ last radiation treatment 03/22/2013  . Chronic kidney disease, stage IV (severe) ([place]) 10/10/2007  . 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 Autumn Messing; S/P re-excision of superior and inferior margins 10/27/2012.   Marland Kitchen GERD (gastroesophageal reflux disease)   . Hepatitis C   . Hot flashes   . Hx of radiation therapy 2005   left breast  . Hx of radiation therapy 01/11/13- 03/22/13   right breast 5760 cGy 30 sessions  . Hypertension   . Low back pain   . Lumbar spinal stenosis    S/P lumbar decompressive laminectomy, fusion and plating for lumbar spinal stensosis  . Normocytic anemia    With thrombocytosis  . Osteoarthritis   . Personal history of radiation therapy   . Right ureteral stone 2002  . Shortness of breath    from pain  . Uterine fibroid   . Wears dentures    top      Objective/Physical Exam Neurovascular status intact.  Skin incisions appear to be well coapted. No sign of infectious process noted. No dehiscence. No active bleeding noted. No edema noted to the surgical extremity.   Assessment: 1. s/p plantar fibroma excision right foot. DOS: 09/23/17   Plan of Care:  1. Patient was evaluated.  2. Recommended good shoe gear.  3. May resume full activity with no restrictions.  4. Return to clinic as needed.    Edrick Kins, DPM [facility]  Dr. Edrick Kins, [address]                                        [place], [place] [address]                Office [phone]  Fax [phone]

## 2017-11-17 ENCOUNTER — Other Ambulatory Visit: Payer: Self-pay | Admitting: Family Medicine

## 2017-11-18 NOTE — Telephone Encounter (Signed)
Gabapentin refill Last OV:08/16/17 Last refill:08/16/17 180 cap/2 refills [facility]: [facility], [place] [phone] (Phone) [phone] (Fax)

## 2017-11-23 ENCOUNTER — Encounter: Payer: Self-pay | Admitting: Family Medicine

## 2017-11-23 DIAGNOSIS — Z79899 Other long term (current) drug therapy: Secondary | ICD-10-CM | POA: Diagnosis not present

## 2017-11-23 DIAGNOSIS — M5136 Other intervertebral disc degeneration, lumbar region: Secondary | ICD-10-CM | POA: Diagnosis not present

## 2017-12-04 ENCOUNTER — Encounter: Payer: Self-pay | Admitting: Family Medicine

## 2017-12-04 ENCOUNTER — Telehealth: Payer: Self-pay | Admitting: Family Medicine

## 2017-12-04 DIAGNOSIS — Z91199 Patient's noncompliance with other medical treatment and regimen due to unspecified reason: Secondary | ICD-10-CM

## 2017-12-04 DIAGNOSIS — Z9119 Patient's noncompliance with other medical treatment and regimen: Secondary | ICD-10-CM

## 2017-12-04 HISTORY — DX: Patient's noncompliance with other medical treatment and regimen due to unspecified reason: Z91.199

## 2017-12-04 HISTORY — DX: Patient's noncompliance with other medical treatment and regimen: Z91.19

## 2017-12-04 NOTE — Telephone Encounter (Signed)
Received copy of labs from pt's new pain management provider at [facility]. Her Cr had come back on 3.3 - last prior 3 mos ago was 1.5 and highest prior up to 2.3  Pt needs to be seen in an OV asap for acute renal failure for recheck.  Ok to see another provider for BP and kidney check since I am out of office this wk.  I referred pt to nephrology 1/23 for CKD and when I last saw her 3/18 she hadn't scheduled yet so I put the [place] phone # of her AVS so she could call them directly to get her appt scheduled. Did she ever do this?  If not, she needs to. Or we can put in a new referral for her but she will have to answer her phone when people call her to sched this time.

## 2017-12-04 NOTE — Telephone Encounter (Signed)
Someone in clinical needs to call patient and explain why she needs to come in then transfer to clerical to make an apt

## 2017-12-06 NOTE — Telephone Encounter (Signed)
Spoke to patient concerning her kidney problems. The patient did not schedule with Nephrology and she stated she will. Also, she has an appointment scheduled on 12/22/2017 at 10:00 am with you. I checked with clerical to see if the office had a earlier appointment. The earliest appointment opening is 12/08/2017 with Philis Fendt, [facility] or Dr Mitchel Honour. The patient has a lot of personal issues happening this week, so she will keep the one for 12/22/2017.

## 2017-12-09 NOTE — Telephone Encounter (Signed)
Please make sure she is very aware that this is a life threatening issue and something she is NOT going to recover from but with good medical compliance MIGHT be able to stablize - deferring her appointment for several weeks is taking decades off her life - if this continues, she will be on dialysis within the year or dead.  Chronic kidney failure isn't going to wait until she happens to have time to take care of it - she needs to have sched her appointment with nephrology by her appointment with me on 7/24 which she need to keep as well as be seen immediately for recheck by one of my colleagues.  If she chooses not to comply, I want to make sure she understands that this is AGAINST medical advice - she is loosing kidney function weekly which she will be unable to recover.  Has to take ALL of her BP meds daily.

## 2017-12-10 ENCOUNTER — Other Ambulatory Visit: Payer: Self-pay | Admitting: Internal Medicine

## 2017-12-10 DIAGNOSIS — Z853 Personal history of malignant neoplasm of breast: Secondary | ICD-10-CM

## 2017-12-13 NOTE — Telephone Encounter (Signed)
Message from Dr. Brigitte Pulse sent to [facility] to ensure pt called.

## 2017-12-16 ENCOUNTER — Other Ambulatory Visit: Payer: Self-pay | Admitting: Family Medicine

## 2017-12-16 DIAGNOSIS — Z853 Personal history of malignant neoplasm of breast: Secondary | ICD-10-CM

## 2017-12-21 NOTE — Telephone Encounter (Signed)
Attempted to contact pt to relay provider message and confirm 7/24 appt.  No answer and vm is full.

## 2017-12-22 ENCOUNTER — Encounter: Payer: Self-pay | Admitting: Family Medicine

## 2017-12-22 ENCOUNTER — Ambulatory Visit (INDEPENDENT_AMBULATORY_CARE_PROVIDER_SITE_OTHER): Payer: Medicare HMO

## 2017-12-22 ENCOUNTER — Ambulatory Visit (INDEPENDENT_AMBULATORY_CARE_PROVIDER_SITE_OTHER): Payer: Medicare HMO | Admitting: Family Medicine

## 2017-12-22 ENCOUNTER — Other Ambulatory Visit: Payer: Self-pay

## 2017-12-22 VITALS — BP 138/84 | HR 60 | Temp 98.5°F | Ht 63.0 in | Wt 142.2 lb

## 2017-12-22 DIAGNOSIS — Z23 Encounter for immunization: Secondary | ICD-10-CM

## 2017-12-22 DIAGNOSIS — Z91199 Patient's noncompliance with other medical treatment and regimen due to unspecified reason: Secondary | ICD-10-CM

## 2017-12-22 DIAGNOSIS — Z9119 Patient's noncompliance with other medical treatment and regimen: Secondary | ICD-10-CM

## 2017-12-22 DIAGNOSIS — J449 Chronic obstructive pulmonary disease, unspecified: Secondary | ICD-10-CM

## 2017-12-22 DIAGNOSIS — N183 Chronic kidney disease, stage 3 (moderate): Secondary | ICD-10-CM

## 2017-12-22 DIAGNOSIS — I517 Cardiomegaly: Secondary | ICD-10-CM | POA: Diagnosis not present

## 2017-12-22 DIAGNOSIS — Z720 Tobacco use: Secondary | ICD-10-CM | POA: Diagnosis not present

## 2017-12-22 DIAGNOSIS — I10 Essential (primary) hypertension: Secondary | ICD-10-CM

## 2017-12-22 DIAGNOSIS — F17200 Nicotine dependence, unspecified, uncomplicated: Secondary | ICD-10-CM

## 2017-12-22 DIAGNOSIS — E2839 Other primary ovarian failure: Secondary | ICD-10-CM | POA: Diagnosis not present

## 2017-12-22 DIAGNOSIS — F17210 Nicotine dependence, cigarettes, uncomplicated: Secondary | ICD-10-CM

## 2017-12-22 DIAGNOSIS — N1832 Chronic kidney disease, stage 3b: Secondary | ICD-10-CM

## 2017-12-22 LAB — POCT CBC
Granulocyte percent: 54.2 %G (ref 37–80)
HCT, POC: 36.6 % — AB (ref 37.7–47.9)
Hemoglobin: 11.7 g/dL — AB (ref 12.2–16.2)
Lymph, poc: 2.2 (ref 0.6–3.4)
MCH, POC: 28 pg (ref 27–31.2)
MCHC: 32 g/dL (ref 31.8–35.4)
MCV: 87.7 fL (ref 80–97)
MID (cbc): 0.4 (ref 0–0.9)
MPV: 6.3 fL (ref 0–99.8)
POC Granulocyte: 3 (ref 2–6.9)
POC LYMPH PERCENT: 39.4 %L (ref 10–50)
POC MID %: 6.4 % (ref 0–12)
Platelet Count, POC: 441 10*3/uL — AB (ref 142–424)
RBC: 4.18 M/uL (ref 4.04–5.48)
RDW, POC: 15.6 %
WBC: 5.6 10*3/uL (ref 4.6–10.2)

## 2017-12-22 LAB — POC MICROSCOPIC URINALYSIS (UMFC): Mucus: ABSENT

## 2017-12-22 LAB — POCT URINALYSIS DIP (MANUAL ENTRY)
BILIRUBIN UA: NEGATIVE
Glucose, UA: NEGATIVE mg/dL
Ketones, POC UA: NEGATIVE mg/dL
Leukocytes, UA: NEGATIVE
NITRITE UA: NEGATIVE
PH UA: 7 (ref 5.0–8.0)
Protein Ur, POC: 30 mg/dL — AB
RBC UA: NEGATIVE
Spec Grav, UA: 1.015 (ref 1.010–1.025)
Urobilinogen, UA: 1 E.U./dL

## 2017-12-22 MED ORDER — AMLODIPINE BESYLATE 10 MG PO TABS: 10.0000 mg | ORAL_TABLET | Freq: Every day | ORAL | 1 refills | Status: DC

## 2017-12-22 MED ORDER — GABAPENTIN 300 MG PO CAPS: 300.0000 mg | ORAL_CAPSULE | Freq: Two times a day (BID) | ORAL | 1 refills | Status: DC

## 2017-12-22 MED ORDER — CARVEDILOL 12.5 MG PO TABS: 12.5000 mg | ORAL_TABLET | Freq: Two times a day (BID) | ORAL | 1 refills | Status: DC

## 2017-12-22 MED ORDER — FAMOTIDINE 20 MG PO TABS: 20.0000 mg | ORAL_TABLET | Freq: Every day | ORAL | 0 refills | Status: DC

## 2017-12-22 MED ORDER — CLONIDINE HCL 0.2 MG PO TABS: 0.2000 mg | ORAL_TABLET | Freq: Two times a day (BID) | ORAL | 0 refills | Status: DC

## 2017-12-22 NOTE — Progress Notes (Addendum)
Subjective:  By signing my name below, I, Essence Howell, attest that this documentation has been prepared under the direction and in the presence of Delman Cheadle, MD Electronically Signed: Ladene Artist, ED Scribe 12/22/2017 at 10:08 AM.   Patient ID: Alcus Dad, female    DOB: [birth year], 67 y.o.   MRN: [id number]  Chief Complaint  Patient presents with  . Hypertension    needs  Norvasc and Coreg   HPI JOSEPHINE WOOLDRIDGE is a 67 y.o. female who presents to Primary Care at [facility] for f/u on HTN and med refill of Norvasc and Coreg. Pt finished Norvasc yesterday. She has been checking her BP twice/day with average of 130-138/60 and reports one reading of 110/60 last wk. Pt has had a cup of coffee today with a lot of creamer.  CKD Pt states that she is not interested in dialysis as she is not a fan of needles. She expresses that she understands that she would have a shortened life span with an expectancy of 5 yrs and states "I'm good". However, pt is interested in meeting with nephrology. She also reports that her son was on dialysis but is now off after receiving a transplant, however, she isn't sure why he had CKD.  GERD Takes Pepcid prn, especially when she eats fried foods, but states she eats mostly boiled and baked foods.  Abdominal Pain Pt reports intermittent abdominal pain/cramping that is similar to the pain that she experiences during menstrual periods. She plans to make an appointment to f/u about this further.  Tobacco Use Pt is currently smoking ~1 pack every 3 days. She started smoking at age 66.  Immunizations Immunization History  Administered Date(s) Administered  . Hepatitis A, Adult 01/18/2017  Pneumonia vaccine: today  Past Medical History:  Diagnosis Date  . Atelectasis 2002   Bilateral  . Bone spur 2008   Right calcaneal foot spur  . Breast cancer ([place]) 2004   Ductal carcinoma in situ of the left breast; S/P left partial mastectomy 02/26/2003; S/P  re-excision of cranial and lateral margins11/18/2004.radiation  . Breast cancer ([place]) 09/21/2012   right breast/ last radiation treatment 03/22/2013  . Chronic kidney disease, stage IV (severe) ([place]) 10/10/2007  . 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 Autumn Messing; S/P re-excision of superior and inferior margins 10/27/2012.   Marland Kitchen GERD (gastroesophageal reflux disease)   . 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 Hot flashes   . Hx of radiation therapy 2005   left breast  . Hx of radiation therapy 01/11/13- 03/22/13   right breast 5760 cGy 30 sessions  . Hypertension   . Low back pain   . Lumbar spinal stenosis    S/P lumbar decompressive laminectomy, fusion and plating for lumbar spinal stensosis  . Normocytic anemia    With thrombocytosis  . Osteoarthritis   . Personal history of radiation therapy   . Right ureteral stone 2002  . Shortness of breath    from pain  . Uterine fibroid   . Wears dentures    top   Past Surgical History:  Procedure Laterality Date  . ANTERIOR LAT LUMBAR FUSION N/A 01/18/2014   Procedure: ANTERIOR LATERAL LUMBAR FUSION LUMBAR TWO-THREE;  Surgeon: Eustace Moore, MD;  Location: [facility];  Service: Neurosurgery;  Laterality: N/A;  . ANTERIOR LUMBAR FUSION  01/18/2014  . BACK SURGERY    .  BREAST LUMPECTOMY Left 01/2003  . BREAST LUMPECTOMY Right 2014  . BREAST LUMPECTOMY WITH NEEDLE LOCALIZATION AND AXILLARY SENTINEL LYMPH NODE BX Right 10/13/2012   Procedure: BREAST LUMPECTOMY WITH NEEDLE LOCALIZATION;  Surgeon: [name], MD;  Location: [facility];  Service: General;  Laterality: Right;  Right breast wire localized lumpectomy  . LAMINECTOMY  05/27/2009   Lumbar decompressive laminectomy, fusion and plating for lumbar spinal stensosis  . LUMBAR LAMINECTOMY/DECOMPRESSION MICRODISCECTOMY Left 03/23/2013   Procedure: LUMBAR LAMINECTOMY/DECOMPRESSION  MICRODISCECTOMY 1 LEVEL;  Surgeon: Eustace Moore, MD;  Location: [facility];  Service: Neurosurgery;  Laterality: Left;  LUMBAR LAMINECTOMY/DECOMPRESSION MICRODISCECTOMY 1 LEVEL  . MASTECTOMY, PARTIAL Left 02/26/2003   ; S/P re-excision of cranial and lateral margins 04/19/2003.   Marland Kitchen RE-EXCISION OF BREAST CANCER,SUPERIOR MARGINS Right 10/27/2012   Procedure: RE-EXCISION OF BREAST CANCER,SUPERIOR and inferior MARGINS;  Surgeon: [name], MD;  Location: [facility];  Service: General;  Laterality: Right;  . RE-EXCISION OF BREAST LUMPECTOMY Left 04/2003   Current Outpatient Medications on File Prior to Visit  Medication Sig Dispense Refill  . albuterol (PROAIR HFA) 108 (90 Base) MCG/ACT inhaler INHALE TWO PUFFS INTO LUNGS EVERY 4 HOURS AS NEEDED FOR WHEEZING FOR COUGH FOR SHORTNESS OF BREATH 1 each 11  . amLODipine (NORVASC) 10 MG tablet Take 1 tablet (10 mg total) by mouth daily. 90 tablet 0  . carvedilol (COREG) 12.5 MG tablet Take 1 tablet (12.5 mg total) by mouth 2 (two) times daily with a meal. 180 tablet 0  . cloNIDine (CATAPRES) 0.2 MG tablet Take 1 tablet (0.2 mg total) by mouth 2 (two) times daily. 180 tablet 0  . doxycycline (VIBRA-TABS) 100 MG tablet Take 1 tablet (100 mg total) by mouth 2 (two) times daily. 20 tablet 0  . famotidine (PEPCID) 20 MG tablet Take 1 tablet (20 mg total) by mouth at bedtime. 90 tablet 0  . gabapentin (NEURONTIN) 300 MG capsule Take 2 capsules (600 mg total) by mouth 3 (three) times daily. 180 capsule 2  . oxyCODONE-acetaminophen (PERCOCET) 5-325 MG tablet Take 1 tablet by mouth every 6 (six) hours as needed for severe pain. 30 tablet 0  . predniSONE (DELTASONE) 20 MG tablet Take 3 tabs qd x 3d, then 2 tabs qd x 3d then 1 tab qd x 3d. 18 tablet 0  . traMADol (ULTRAM) 50 MG tablet Take 1-2 tablets (50-100 mg total) by mouth every 8 (eight) hours as needed for moderate pain. 180 tablet 0  . [DISCONTINUED] lisinopril (PRINIVIL,ZESTRIL) 10 MG tablet Take 1 tablet  (10 mg total) by mouth daily. 30 tablet 6   No current facility-administered medications on file prior to visit.    Allergies  Allergen Reactions  . Shrimp [Shellfish Allergy] Shortness Of Breath  . Bactroban [Mupirocin] Other (See Comments)    "Sores in nose"  . Vicodin [Hydrocodone-Acetaminophen] Itching and Nausea And Vomiting    This is patient's home medication  . Lisinopril Cough  . Tylenol [Acetaminophen] Itching   Family History  Problem Relation Age of Onset  . Colon cancer Mother 66  . Hypertension Mother   . Diabetes Sister 52  . Hypertension Sister   . Diabetes Brother   . Hypertension Brother   . Diabetes Brother   . Hypertension Brother   . Kidney disease Son        On dialysis  . Hypertension Son   . Diabetes Son   . Multiple sclerosis Son   .  Bone cancer Sister 73  . Breast cancer Neg Hx   . Cervical cancer Neg Hx    Social History   Socioeconomic History  . Marital status: Single    Spouse name: Not on file  . Number of children: Not on file  . Years of education: Not on file  . Highest education level: Not on file  Occupational History  . Not on file  Social Needs  . Financial resource strain: Not on file  . Food insecurity:    Worry: Not on file    Inability: Not on file  . Transportation needs:    Medical: Not on file    Non-medical: Not on file  Tobacco Use  . Smoking status: Current Some Day Smoker    Packs/day: 0.25    Years: 44.00    Pack years: 11.00    Types: Cigarettes  . Smokeless tobacco: Never Used  . Tobacco comment: smoking less  Substance and Sexual Activity  . Alcohol use: Yes    Alcohol/week: 1.2 oz    Types: 2 Cans of beer per week    Comment:  "couple beers q weekend"  . Drug use: Yes    Types: Cocaine    Comment: "stopped back in the 1980's"  . Sexual activity: Yes    Birth control/protection: Post-menopausal  Lifestyle  . Physical activity:    Days per week: Not on file    Minutes per session: Not on file    . Stress: Not on file  Relationships  . Social connections:    Talks on phone: Not on file    Gets together: Not on file    Attends religious service: Not on file    Active member of club or organization: Not on file    Attends meetings of clubs or organizations: Not on file    Relationship status: Not on file  Other Topics Concern  . Not on file  Social History Narrative  . Not on file   Depression screen [facility] 2/9 12/22/2017 08/16/2017 07/19/2017 12/09/2016 01/20/2016  Decreased Interest 0 0 0 0 0  Down, Depressed, Hopeless 0 0 0 0 0  PHQ - 2 Score 0 0 0 0 0  Some recent data might be hidden   Review of Systems  Gastrointestinal: Positive for abdominal pain (intermittent).      Objective:   Physical Exam  Constitutional: She is oriented to person, place, and time. She appears well-developed and well-nourished. No distress.  HENT:  Head: Normocephalic and atraumatic.  Eyes: Conjunctivae and EOM are normal.  Neck: Neck supple. No tracheal deviation present.  Cardiovascular: Normal rate and regular rhythm.  Pulmonary/Chest: Effort normal and breath sounds normal. No respiratory distress.  Musculoskeletal: Normal range of motion.  Neurological: She is alert and oriented to person, place, and time.  Skin: Skin is warm and dry.  Psychiatric: She has a normal mood and affect. Her behavior is normal.  Nursing note and vitals reviewed.  BP 138/84 (BP Location: Left Arm, Patient Position: Sitting, Cuff Size: Normal)   Pulse 60   Temp 98.5 F (36.9 C) (Oral)   Ht 5\' 3"  (1.6 m)   Wt 142 lb 3.2 oz (64.5 kg)   SpO2 98%   BMI 25.19 kg/m     Results for orders placed or performed in visit on 12/22/17  POCT CBC  Result Value Ref Range   WBC 5.6 4.6 - 10.2 K/uL   Lymph, poc 2.2 0.6 - 3.4   POC  LYMPH PERCENT 39.4 10 - 50 %L   MID (cbc) 0.4 0 - 0.9   POC MID % 6.4 0 - 12 %M   POC Granulocyte 3.0 2 - 6.9   Granulocyte percent 54.2 37 - 80 %G   RBC 4.18 4.04 - 5.48 M/uL    Hemoglobin 11.7 (A) 12.2 - 16.2 g/dL   HCT, POC 36.6 (A) 37.7 - 47.9 %   MCV 87.7 80 - 97 fL   MCH, POC 28.0 27 - 31.2 pg   MCHC 32.0 31.8 - 35.4 g/dL   RDW, POC 15.6 %   Platelet Count, POC 441 (A) 142 - 424 K/uL   MPV 6.3 0 - 99.8 fL  POCT urinalysis dipstick  Result Value Ref Range   Color, UA yellow yellow   Clarity, UA cloudy (A) clear   Glucose, UA negative negative mg/dL   Bilirubin, UA negative negative   Ketones, POC UA negative negative mg/dL   Spec Grav, UA 1.015 1.010 - 1.025   Blood, UA negative negative   pH, UA 7.0 5.0 - 8.0   Protein Ur, POC =30 (A) negative mg/dL   Urobilinogen, UA 1.0 0.2 or 1.0 E.U./dL   Nitrite, UA Negative Negative   Leukocytes, UA Negative Negative  POCT Microscopic Urinalysis ([facility])  Result Value Ref Range   WBC,UR,HPF,POC Few (A) None WBC/hpf   RBC,UR,HPF,POC None None RBC/hpf   Bacteria Few (A) None, Too numerous to count   Mucus Absent Absent   Epithelial Cells, UR Per Microscopy Moderate (A) None, Too numerous to count cells/hpf   Dg Chest 2 View  Result Date: 12/22/2017 CLINICAL DATA:  Ongoing tobacco use, unintentional weight loss. EXAM: CHEST - 2 VIEW COMPARISON:  06/02/2015. FINDINGS: The heart is enlarged. Lung fields are clear. Degenerative change thoracic spine. Surgical clips RIGHT breast. IMPRESSION: Stable exam.  Cardiomegaly.  No active disease. Electronically Signed   By: Staci Righter M.D.   On: 12/22/2017 10:54   Assessment & Plan:   1. Chronic kidney disease (CKD) stage G3b/A2, moderately decreased glomerular filtration rate (GFR) between 30-44 mL/min/1.73 square meter and albuminuria creatinine ratio between 30-299 mg/g ([place]) - checked at [facility] pain clinic 11/23/17 and Cr had worsened to 3.3 (highest prior was 2.3 and most recent was 1.5). Pt contacted to f/u immed but she could not come in for recheck until today - 1 mo later.  Urgency and importance of addressing acute on chronic renal failure again reinforced to pt.  Needs to est with nephrology asap as now as progressed to CKD IV  2. Essential hypertension - much improved control today - importance of rigid compliance w/ clonidine and consequences of missed dosing resulting in rebound HTN which could cause kidney failure, MI, CVA, death again reinforced, pt understood and felt highly confident in her ability to comply with bid clondine.  3. Chronic obstructive pulmonary disease, unspecified COPD type ([place])   4. Tobacco use disorder   5. Non compliance with medical treatment   6. Estrogen deficiency - pt does need 1 last pap smear as she is 67 yo - advised pt to sched future appt for this    Orders Placed This Encounter  Procedures  . DG Bone Density    INS-HUMANA MCR PF NONE/WT 139 LBS/NO CALC SUPP/NO NEEDS/BC PT COSIGN REQ    Standing Status:   Future    Standing Expiration Date:   02/23/2019    Scheduling Instructions:     Pt has mammgram scheduled for 01/20/2018 -  can she PLEASE get her bone density done at that time appointment or around that time? Thanks.    Order Specific Question:   Reason for Exam (SYMPTOM  OR DIAGNOSIS REQUIRED)    Answer:   estrogen deficiency    Order Specific Question:   Preferred imaging location?    Answer:   [facility]  . DG Chest 2 View    Standing Status:   Future    Number of Occurrences:   1    Standing Expiration Date:   12/22/2018    Order Specific Question:   Reason for Exam (SYMPTOM  OR DIAGNOSIS REQUIRED)    Answer:   ongoing tobacco use, unintentional weight loss    Order Specific Question:   Preferred imaging location?    Answer:   [facility]  . Pneumococcal conjugate vaccine 13-valent IM  . Comprehensive metabolic panel  . Microalbumin/Creatinine Ratio, Urine  . TSH  . Ambulatory referral to Nephrology    Referral Priority:   Routine    Referral Type:   Consultation    Referral Reason:   Specialty Services Required    Requested Specialty:   Nephrology    Number of Visits Requested:   1  . POCT  CBC  . POCT urinalysis dipstick  . POCT Microscopic Urinalysis ([facility])    Meds ordered this encounter  Medications  . carvedilol (COREG) 12.5 MG tablet    Sig: Take 1 tablet (12.5 mg total) by mouth 2 (two) times daily with a meal.    Dispense:  180 tablet    Refill:  1  . amLODipine (NORVASC) 10 MG tablet    Sig: Take 1 tablet (10 mg total) by mouth daily.    Dispense:  90 tablet    Refill:  1  . gabapentin (NEURONTIN) 300 MG capsule    Sig: Take 1-2 capsules (300-600 mg total) by mouth 2 (two) times daily.    Dispense:  180 capsule    Refill:  1    Pt does not need currently so please hold on file until refill is requested. Please not sig change and cancel prior rx for tid.  . cloNIDine (CATAPRES) 0.2 MG tablet    Sig: Take 1 tablet (0.2 mg total) by mouth 2 (two) times daily.    Dispense:  180 tablet    Refill:  0    Pt does not need currently. Please add this on as refills to current rx.  . famotidine (PEPCID) 20 MG tablet    Sig: Take 1 tablet (20 mg total) by mouth at bedtime.    Dispense:  90 tablet    Refill:  0    Please consider 90 day supplies to promote better adherence    I personally performed the services described in this documentation, which was scribed in my presence. The recorded information has been reviewed and considered, and addended by me as needed.   Delman Cheadle, M.D.  Primary Care at [facility] [address] [place],  [address] [phone] phone [phone] fax  01/27/18 9:41 PM

## 2017-12-22 NOTE — Progress Notes (Deleted)
Needs one last pap smear since she is 67 yo If she isn't going to get the screening lung Ct, then check a CXR today

## 2017-12-22 NOTE — Telephone Encounter (Signed)
Pt had office visit this morning.

## 2017-12-22 NOTE — Patient Instructions (Addendum)
Call [facility] at [phone] to ensure that they can schedule you to have your DEXA bone scan done at the same appointment as your mammogram. I have already sent an order over there to request that this be done but you should double check.  I placed a new referral to [facility] but last time they had trouble contacting you so it would likely be easiest for you to call them to schedule your appointment. The kidney office is at: [address] [place], [place] [address] Phone: [phone]   You need to make an appointment to come back to see me for a FULL PHYSICAL with a PELVIC EXAM and PAP SMEAR to further evaluate your abdominal pain and ensure you do not have cervical cancer.  At that visit, DO take your morning blood pressure medications but only with WATER OR BLACK COFFEE. NOTHING TO EAT AFTER MIDNIGHT THE EVENING PRIOR AND DO NOT DRINK ANYTHING WITH PROTEIN, CREAM, MILK, OR SUGAR IN IT.    IF you received an x-ray today, you will receive an invoice from [facility]. Please contact [facility] at [phone] with questions or concerns regarding your invoice.   IF you received labwork today, you will receive an invoice from [facility]. Please contact [facility] at [phone] with questions or concerns regarding your invoice.   Our billing staff will not be able to assist you with questions regarding bills from these companies.  You will be contacted with the lab results as soon as they are available. The fastest way to get your results is to activate your My Chart account. Instructions are located on the last page of this paperwork. If you have not heard from [place] regarding the results in 2 weeks, please contact this office.     End-Stage Kidney Disease End-stage kidney disease occurs when the kidneys are so damaged that they cannot do their job. The kidneys are two organs that do many important jobs in the body, which  include:  Removing wastes and extra fluids from the blood.  Making hormones that maintain the amount of fluid in your tissues and blood vessels.  Maintaining the right amount of fluids and chemicals in the body.  When the kidneys are damaged and cannot do their job, life-threatening problems occur. Without the help of the kidneys, toxins build up in the blood. In end-stage kidney disease, the kidneys cannot get better. What are the causes? End-stage kidney disease usually occurs when a long-lasting (chronic) kidney disease gets worse. It may also occur after the kidneys are suddenly damaged (acute kidney injury). What increases the risk? This condition is more likely to develop in people who are:  Older than age 50.  Female.  Of African-American descent.  Current smokers or former smokers.  Obese.  You may also have an increased risk for end-stage kidney disease if you:  Have a family history of chronic kidney disease (CKD).  Have had kidney disease for many years.  Have other longstanding medical conditions that affect the kidneys, such as: ? Cardiovascular disease including high blood pressure. ? Diabetes. ? Certain diseases that affect the immune system.  What are the signs or symptoms?  Swelling (edema) of the face, legs, ankles, or feet.  Numbness, tingling, or loss of feeling (sensation) in your hands or feet.  Tiredness (lethargy).  Nausea or vomiting.  Confusion, trouble concentrating, or loss of consciousness.  Chest pain.  Shortness of breath.  Little to no urine production.  Muscle twitches  and cramps, especially in the legs.  Constant itchiness.  Loss of appetite.  Pale skin and tissue lining your eyelids (conjunctiva).  Headaches.  Abnormally dark or light skin.  Decrease in muscle size (muscle wasting).  Easy bruising.  Frequent hiccups.  Stopping of menstruation in women.  Seizures. How is this diagnosed? Your health care  provider will measure your blood pressure and do some tests. These may include:  Urine tests.  Blood tests.  Imaging tests.  A test in which a sample of tissue is removed from the kidneys to be looked at under a microscope (kidney biopsy).  How is this treated? There are two treatments for end-stage kidney disease:  A procedure that removes toxic wastes from the body (dialysis). Depending on the type of dialysis you choose, it may be performed more than one time a day (peritoneal dialysis) or several times a week (hemodialysis).  Surgery toreceive a new kidney (kidney transplant).  In addition to having dialysis or a kidney transplant, you may need to take medicines:  To control high blood pressure (hypertension).  To control cholesterol.  To maintain healthy electrolyte levels in your blood.  You may also be given a specific diet to follow that includes requirements or limits for:  Salt (sodium).  Protein.  Phosphorous.  Potassium.  Calcium.  Follow these instructions at home:  Follow your prescribed diet.  Take over-the-counter and prescription medicines only as told by your health care provider. ? Do not take any new medicines unless approved by your health care provider. Many medicines can worsen your kidney damage. ? Do not take any vitamin and mineral supplements unless approved by your health care provider. Many nutritional supplements can worsen your kidney damage. ? The dose of some medicines that you take may need to be adjusted.  Do not use any tobacco products, such as cigarettes, chewing tobacco, and e-cigarettes. If you need help quitting, ask your health care provider.  Keep all follow-up visits as told by your health care provider. This is important.  Keep track of your blood pressure. Report changes in your blood pressure as told by your health care provider.  Achieve and maintain a healthy weight. If you need help with this, ask your health care  provider.  Start or continue an exercise plan. Try to exercise at least 30 minutes a day, 5 days a week.  Stay current with immunizations as told by your health care provider. Where to find more information:  American Association of Kidney Patients: BombTimer.gl  National Kidney Foundation: www.kidney.[place]: https://mathis.com/  [facility]: www.lifeoptions.org and www.kidneyschool.org Contact a health care provider if:  Your symptoms get worse.  You develop new symptoms. Get help right away if:  You have weakness in an arm or leg on one side of your body.  You have difficulty speaking or you are slurring your speech.  You have a sudden change in your vision.  You have a sudden, severe headache.  You have a sudden weight increase.  You have difficulty breathing.  Your symptoms suddenly get worse. This information is not intended to replace advice given to you by your health care provider. Make sure you discuss any questions you have with your health care provider. Document Released: 08/08/2003 Document Revised: 10/24/2015 Document Reviewed: 01/15/2012 Elsevier Interactive Patient Education  2017 [employer].

## 2017-12-23 ENCOUNTER — Ambulatory Visit: Payer: Medicare HMO | Admitting: Family Medicine

## 2017-12-23 DIAGNOSIS — G894 Chronic pain syndrome: Secondary | ICD-10-CM | POA: Diagnosis not present

## 2017-12-23 DIAGNOSIS — M5136 Other intervertebral disc degeneration, lumbar region: Secondary | ICD-10-CM | POA: Diagnosis not present

## 2017-12-23 DIAGNOSIS — Z79899 Other long term (current) drug therapy: Secondary | ICD-10-CM | POA: Diagnosis not present

## 2017-12-23 LAB — COMPREHENSIVE METABOLIC PANEL
ALK PHOS: 74 IU/L (ref 39–117)
ALT: 10 IU/L (ref 0–32)
AST: 13 IU/L (ref 0–40)
Albumin/Globulin Ratio: 1.6 (ref 1.2–2.2)
Albumin: 4.2 g/dL (ref 3.6–4.8)
BUN/Creatinine Ratio: 10 — ABNORMAL LOW (ref 12–28)
BUN: 28 mg/dL — ABNORMAL HIGH (ref 8–27)
Bilirubin Total: 0.2 mg/dL (ref 0.0–1.2)
CHLORIDE: 101 mmol/L (ref 96–106)
CO2: 27 mmol/L (ref 20–29)
Calcium: 9.9 mg/dL (ref 8.7–10.3)
Creatinine, Ser: 2.86 mg/dL — ABNORMAL HIGH (ref 0.57–1.00)
GFR calc Af Amer: 19 mL/min/{1.73_m2} — ABNORMAL LOW (ref >59)
GFR calc non Af Amer: 17 mL/min/{1.73_m2} — ABNORMAL LOW (ref >59)
Globulin, Total: 2.7 g/dL (ref 1.5–4.5)
Glucose: 100 mg/dL — ABNORMAL HIGH (ref 65–99)
POTASSIUM: 4.3 mmol/L (ref 3.5–5.2)
Sodium: 142 mmol/L (ref 134–144)
Total Protein: 6.9 g/dL (ref 6.0–8.5)

## 2017-12-23 LAB — MICROALBUMIN / CREATININE URINE RATIO
Creatinine, Urine: 38.2 mg/dL
Microalb/Creat Ratio: <7.9 mg/g creat (ref 0.0–30.0)

## 2017-12-23 LAB — TSH: TSH: 1.25 u[IU]/mL (ref 0.450–4.500)

## 2018-01-07 ENCOUNTER — Telehealth: Payer: Self-pay | Admitting: Family Medicine

## 2018-01-07 NOTE — Telephone Encounter (Signed)
Called pt to reschedule their appt 02/28/18. Rescheduled

## 2018-01-20 ENCOUNTER — Inpatient Hospital Stay
Admission: RE | Admit: 2018-01-20 | Discharge: 2018-01-20 | Disposition: A | Discharge status: Home / Self Care | Payer: Medicaid Other | Source: Ambulatory Visit | Attending: Internal Medicine | Admitting: Internal Medicine

## 2018-01-24 DIAGNOSIS — G894 Chronic pain syndrome: Secondary | ICD-10-CM | POA: Diagnosis not present

## 2018-01-24 DIAGNOSIS — Z79899 Other long term (current) drug therapy: Secondary | ICD-10-CM | POA: Diagnosis not present

## 2018-01-24 DIAGNOSIS — M5136 Other intervertebral disc degeneration, lumbar region: Secondary | ICD-10-CM | POA: Diagnosis not present

## 2018-02-02 ENCOUNTER — Encounter: Payer: Self-pay | Admitting: *Deleted

## 2018-02-10 ENCOUNTER — Telehealth: Payer: Self-pay | Admitting: Family Medicine

## 2018-02-18 ENCOUNTER — Other Ambulatory Visit: Payer: Medicare HMO

## 2018-02-18 NOTE — Telephone Encounter (Signed)
error 

## 2018-02-22 ENCOUNTER — Telehealth: Payer: Self-pay | Admitting: Family Medicine

## 2018-02-22 NOTE — Telephone Encounter (Signed)
Called and spoke with pt to reschedule her appt with Dr. Brigitte Pulse from 03/01/18. I was able to reschedule her to 05/04/18 with Dr. Brigitte Pulse at 8:00 am. I advised of time, building number and late policy.  Dr. Brigitte Pulse - she will need a refill on her medications until she can get in to see you in December. Can you check on those and refill if available?  Thank you!

## 2018-02-23 DIAGNOSIS — Z79899 Other long term (current) drug therapy: Secondary | ICD-10-CM | POA: Diagnosis not present

## 2018-02-23 DIAGNOSIS — M5136 Other intervertebral disc degeneration, lumbar region: Secondary | ICD-10-CM | POA: Diagnosis not present

## 2018-02-23 DIAGNOSIS — G894 Chronic pain syndrome: Secondary | ICD-10-CM | POA: Diagnosis not present

## 2018-02-28 ENCOUNTER — Encounter: Payer: Medicare HMO | Admitting: Family Medicine

## 2018-03-01 ENCOUNTER — Encounter: Payer: Medicare HMO | Admitting: Family Medicine

## 2018-03-22 ENCOUNTER — Other Ambulatory Visit: Payer: Self-pay | Admitting: Family Medicine

## 2018-03-22 DIAGNOSIS — G894 Chronic pain syndrome: Secondary | ICD-10-CM | POA: Diagnosis not present

## 2018-03-22 DIAGNOSIS — M5136 Other intervertebral disc degeneration, lumbar region: Secondary | ICD-10-CM | POA: Diagnosis not present

## 2018-03-22 DIAGNOSIS — Z79899 Other long term (current) drug therapy: Secondary | ICD-10-CM | POA: Diagnosis not present

## 2018-03-22 NOTE — Telephone Encounter (Signed)
Requested Prescriptions  Pending Prescriptions Disp Refills  . carvedilol (COREG) 12.5 MG tablet [Pharmacy Med Name: CARVEDILOL 12.5MG  TABLET] 180 tablet 1    Sig: TAKE 1 TABLET (12.5 MG TOTAL) BY MOUTH 2 (TWO) TIMES DAILY WITH A MEAL.     Cardiovascular:  Beta Blockers Passed - 03/22/2018 10:28 AM      Passed - Last BP in normal range    BP Readings from Last 1 Encounters:  12/22/17 138/84         Passed - Last Heart Rate in normal range    Pulse Readings from Last 1 Encounters:  12/22/17 60         Passed - Valid encounter within last 6 months    Recent Outpatient Visits          3 months ago Chronic kidney disease (CKD) stage G3b/A2, moderately decreased glomerular filtration rate (GFR) between 30-44 mL/min/1.73 square meter and albuminuria creatinine ratio between 30-299 mg/g [facility])   [facility], Laurey Arrow, MD   7 months ago Chronic kidney disease (CKD) stage G3b/A2, moderately decreased glomerular filtration rate (GFR) between 30-44 mL/min/1.73 square meter and albuminuria creatinine ratio between 30-299 mg/g [facility])   [facility] at [facility], Laurey Arrow, MD   8 months ago Essential hypertension   [facility] at [facility], Laurey Arrow, MD   9 months ago Essential hypertension   Primary Care at [place], MD      Future Appointments            In 1 month [name], MD Primary Care at [place], [place]         . amLODipine ([place]) 10 MG tablet [Pharmacy Med Name: AMLODIPINE BESYLATE 10MG  TABLET] 90 tablet 1    Sig: TAKE 1 TABLET (10 MG TOTAL) BY MOUTH DAILY.     Cardiovascular:  Calcium Channel Blockers Passed - 03/22/2018 10:28 AM      Passed - Last BP in normal range    BP Readings from Last 1 Encounters:  12/22/17 138/84         Passed - Valid encounter within last 6 months    Recent Outpatient Visits          3 months ago Chronic kidney disease (CKD) stage G3b/A2, moderately decreased glomerular filtration rate (GFR) between 30-44 mL/min/1.73  square meter and albuminuria creatinine ratio between 30-299 mg/g [facility])   [facility] at [facility], Laurey Arrow, MD   7 months ago Chronic kidney disease (CKD) stage G3b/A2, moderately decreased glomerular filtration rate (GFR) between 30-44 mL/min/1.73 square meter and albuminuria creatinine ratio between 30-299 mg/g [facility])   [facility] at [facility], Laurey Arrow, MD   8 months ago Essential hypertension   [facility] at [facility], Laurey Arrow, MD   9 months ago Essential hypertension   [facility] at [facility], Laurey Arrow, MD      Future Appointments            In 1 month Brigitte Pulse Laurey Arrow, MD Primary Care at [place], [facility]

## 2018-04-04 NOTE — Telephone Encounter (Signed)
I am not sure which ones pt needs - please tell pt to call her pharmacy and have them electronically send a refill request if there is none on file about a week before she runs out of any needed med.

## 2018-04-05 ENCOUNTER — Telehealth: Payer: Self-pay | Admitting: *Deleted

## 2018-04-05 NOTE — Telephone Encounter (Signed)
Called patient, no message left voicemail is full.

## 2018-04-19 ENCOUNTER — Ambulatory Visit
Admission: RE | Admit: 2018-04-19 | Discharge: 2018-04-19 | Disposition: A | Discharge status: Home / Self Care | Payer: Medicare HMO | Source: Ambulatory Visit | Attending: Family Medicine | Admitting: Family Medicine

## 2018-04-19 DIAGNOSIS — Z13820 Encounter for screening for osteoporosis: Secondary | ICD-10-CM | POA: Diagnosis not present

## 2018-04-19 DIAGNOSIS — E2839 Other primary ovarian failure: Secondary | ICD-10-CM

## 2018-04-19 DIAGNOSIS — R928 Other abnormal and inconclusive findings on diagnostic imaging of breast: Secondary | ICD-10-CM | POA: Diagnosis not present

## 2018-04-19 DIAGNOSIS — Z853 Personal history of malignant neoplasm of breast: Secondary | ICD-10-CM | POA: Diagnosis not present

## 2018-04-19 DIAGNOSIS — Z780 Asymptomatic menopausal state: Secondary | ICD-10-CM | POA: Diagnosis not present

## 2018-04-25 DIAGNOSIS — Z79899 Other long term (current) drug therapy: Secondary | ICD-10-CM | POA: Diagnosis not present

## 2018-04-25 DIAGNOSIS — G894 Chronic pain syndrome: Secondary | ICD-10-CM | POA: Diagnosis not present

## 2018-04-25 DIAGNOSIS — M5136 Other intervertebral disc degeneration, lumbar region: Secondary | ICD-10-CM | POA: Diagnosis not present

## 2018-05-04 ENCOUNTER — Ambulatory Visit (INDEPENDENT_AMBULATORY_CARE_PROVIDER_SITE_OTHER): Payer: Medicare HMO | Admitting: Family Medicine

## 2018-05-04 ENCOUNTER — Encounter: Payer: Self-pay | Admitting: Family Medicine

## 2018-05-04 ENCOUNTER — Other Ambulatory Visit: Payer: Self-pay

## 2018-05-04 VITALS — BP 120/72 | HR 74 | Temp 98.0°F | Resp 16 | Ht 62.99 in | Wt 132.0 lb

## 2018-05-04 DIAGNOSIS — Z1212 Encounter for screening for malignant neoplasm of rectum: Secondary | ICD-10-CM

## 2018-05-04 DIAGNOSIS — F17210 Nicotine dependence, cigarettes, uncomplicated: Secondary | ICD-10-CM | POA: Diagnosis not present

## 2018-05-04 DIAGNOSIS — G4700 Insomnia, unspecified: Secondary | ICD-10-CM

## 2018-05-04 DIAGNOSIS — J449 Chronic obstructive pulmonary disease, unspecified: Secondary | ICD-10-CM | POA: Diagnosis not present

## 2018-05-04 DIAGNOSIS — I10 Essential (primary) hypertension: Secondary | ICD-10-CM

## 2018-05-04 DIAGNOSIS — M545 Low back pain, unspecified: Secondary | ICD-10-CM

## 2018-05-04 DIAGNOSIS — Z1211 Encounter for screening for malignant neoplasm of colon: Secondary | ICD-10-CM

## 2018-05-04 DIAGNOSIS — Z0001 Encounter for general adult medical examination with abnormal findings: Secondary | ICD-10-CM | POA: Diagnosis not present

## 2018-05-04 DIAGNOSIS — N184 Chronic kidney disease, stage 4 (severe): Secondary | ICD-10-CM | POA: Diagnosis not present

## 2018-05-04 DIAGNOSIS — F17200 Nicotine dependence, unspecified, uncomplicated: Secondary | ICD-10-CM

## 2018-05-04 DIAGNOSIS — E7800 Pure hypercholesterolemia, unspecified: Secondary | ICD-10-CM

## 2018-05-04 DIAGNOSIS — N183 Chronic kidney disease, stage 3 (moderate): Secondary | ICD-10-CM | POA: Diagnosis not present

## 2018-05-04 DIAGNOSIS — G894 Chronic pain syndrome: Secondary | ICD-10-CM | POA: Diagnosis not present

## 2018-05-04 DIAGNOSIS — Z23 Encounter for immunization: Secondary | ICD-10-CM | POA: Diagnosis not present

## 2018-05-04 DIAGNOSIS — Z0000 Encounter for general adult medical examination without abnormal findings: Secondary | ICD-10-CM

## 2018-05-04 DIAGNOSIS — G8929 Other chronic pain: Secondary | ICD-10-CM

## 2018-05-04 LAB — POCT URINALYSIS DIP (MANUAL ENTRY)
BILIRUBIN UA: NEGATIVE
BILIRUBIN UA: NEGATIVE mg/dL
GLUCOSE UA: NEGATIVE mg/dL
LEUKOCYTES UA: NEGATIVE
NITRITE UA: NEGATIVE
PH UA: 6 (ref 5.0–8.0)
Protein Ur, POC: NEGATIVE mg/dL
RBC UA: NEGATIVE
Spec Grav, UA: 1.01 (ref 1.010–1.025)
Urobilinogen, UA: 0.2 E.U./dL

## 2018-05-04 LAB — CBC WITH DIFFERENTIAL/PLATELET
BASOS: 1 %
Basophils Absolute: 0 10*3/uL (ref 0.0–0.2)
EOS (ABSOLUTE): 0.3 10*3/uL (ref 0.0–0.4)
EOS: 4 %
HEMATOCRIT: 37.9 % (ref 34.0–46.6)
Hemoglobin: 12.2 g/dL (ref 11.1–15.9)
Immature Grans (Abs): 0 10*3/uL (ref 0.0–0.1)
Immature Granulocytes: 0 %
LYMPHS: 28 %
Lymphocytes Absolute: 1.7 10*3/uL (ref 0.7–3.1)
MCH: 29.3 pg (ref 26.6–33.0)
MCHC: 32.2 g/dL (ref 31.5–35.7)
MCV: 91 fL (ref 79–97)
Monocytes Absolute: 0.5 10*3/uL (ref 0.1–0.9)
Monocytes: 8 %
NEUTROS ABS: 3.5 10*3/uL (ref 1.4–7.0)
NEUTROS PCT: 59 %
Platelets: 431 10*3/uL (ref 150–450)
RBC: 4.17 x10E6/uL (ref 3.77–5.28)
RDW: 14.3 % (ref 12.3–15.4)
WBC: 6 10*3/uL (ref 3.4–10.8)

## 2018-05-04 LAB — COMPREHENSIVE METABOLIC PANEL
ALBUMIN: 4.2 g/dL (ref 3.6–4.8)
ALK PHOS: 82 IU/L (ref 39–117)
ALT: 13 IU/L (ref 0–32)
AST: 18 IU/L (ref 0–40)
Albumin/Globulin Ratio: 1.8 (ref 1.2–2.2)
BILIRUBIN TOTAL: 0.2 mg/dL (ref 0.0–1.2)
BUN / CREAT RATIO: 10 — AB (ref 12–28)
BUN: 27 mg/dL (ref 8–27)
CHLORIDE: 102 mmol/L (ref 96–106)
CO2: 23 mmol/L (ref 20–29)
Calcium: 9.3 mg/dL (ref 8.7–10.3)
Creatinine, Ser: 2.65 mg/dL — ABNORMAL HIGH (ref 0.57–1.00)
GFR calc Af Amer: 21 mL/min/{1.73_m2} — ABNORMAL LOW (ref >59)
GFR calc non Af Amer: 18 mL/min/{1.73_m2} — ABNORMAL LOW (ref >59)
GLOBULIN, TOTAL: 2.3 g/dL (ref 1.5–4.5)
GLUCOSE: 105 mg/dL — AB (ref 65–99)
Potassium: 3.8 mmol/L (ref 3.5–5.2)
SODIUM: 142 mmol/L (ref 134–144)
Total Protein: 6.5 g/dL (ref 6.0–8.5)

## 2018-05-04 LAB — LIPID PANEL
CHOLESTEROL TOTAL: 165 mg/dL (ref 100–199)
Chol/HDL Ratio: 3.6 ratio (ref 0.0–4.4)
HDL: 46 mg/dL (ref >39)
LDL Calculated: 92 mg/dL (ref 0–99)
Triglycerides: 134 mg/dL (ref 0–149)
VLDL Cholesterol Cal: 27 mg/dL (ref 5–40)

## 2018-05-04 MED ORDER — CLONIDINE HCL 0.2 MG PO TABS: 0.2000 mg | ORAL_TABLET | Freq: Two times a day (BID) | ORAL | 1 refills | Status: DC

## 2018-05-04 MED ORDER — PREGABALIN 75 MG PO CAPS: 75.0000 mg | ORAL_CAPSULE | Freq: Two times a day (BID) | ORAL | 2 refills | Status: DC

## 2018-05-04 MED ORDER — ZOSTER VAC RECOMB ADJUVANTED 50 MCG/0.5ML IM SUSR: 0.5000 mL | Freq: Once | INTRAMUSCULAR | 1 refills | Status: AC

## 2018-05-04 MED ORDER — FAMOTIDINE 10 MG PO TABS: 10.0000 mg | ORAL_TABLET | Freq: Every day | ORAL | 1 refills | Status: DC

## 2018-05-04 NOTE — Progress Notes (Signed)
Subjective:   Monique Henderson is a 67 y.o. female who presents for an Initial Medicare Annual Wellness Visit.  Review of Systems    Continues to c/o pain in her left his buttock. She wants to change from gabapentin to tramadol for pain - was taking 2 gabapenin twice a day w/o relief but other pain medicine helps a lot more. Had foot surgery 08/2017 - had cyst removed still hurts but not as bad. Had two teeth removed at bottom Burning sensation across bra line in back Wants to discuss something for sleep     Objective:    Today's Vitals   05/04/18 0820  BP: 120/72  Pulse: 74  Resp: 16  Temp: 98 F (36.7 C)  TempSrc: Oral  SpO2: 97%  Weight: 132 lb (59.9 kg)  Height: 5' 2.99" (1.6 m)   Body mass index is 23.39 kg/m.  Advanced Directives 05/04/2018 02/20/2017 12/30/2016 11/25/2016 01/20/2016 06/11/2015 03/22/2015  Does Patient Have a Medical Advance Directive? _0  No No  Copy of Healthcare Power of Attorney in Chart? - - - - - No - copy requested -  Would patient like information on creating a medical advance directive? No - Patient declined No - Patient declined - - (No Data) - Yes - Educational materials given  Pre-existing out of facility DNR order (yellow form or pink MOST form) - - - - - - -  Does not have HC POA forms. Her oldest child is mentally handicapped But would prefer her sister Hessie Dien[place] 50 [place]  Current Medications (verified) Outpatient Encounter Medications as of 05/04/2018  Medication Sig  . albuterol (PROAIR HFA) 108 (90 Base) MCG/ACT inhaler INHALE TWO PUFFS INTO LUNGS EVERY 4 HOURS AS NEEDED FOR WHEEZING FOR COUGH FOR SHORTNESS OF BREATH  . amLODipine (NORVASC) 10 MG tablet TAKE 1 TABLET (10 MG TOTAL) BY MOUTH DAILY.  . carvedilol (COREG) 12.5 MG tablet TAKE 1 TABLET (12.5 MG TOTAL) BY MOUTH 2 (TWO) TIMES DAILY WITH A MEAL.  . cloNIDine (CATAPRES) 0.2 MG tablet Take 1 tablet (0.2 mg total) by mouth 2 (two) times daily.  .  famotidine (PEPCID) 20 MG tablet Take 1 tablet (20 mg total) by mouth at bedtime.  . gabapentin (NEURONTIN) 300 MG capsule Take 1-2 capsules (300-600 mg total) by mouth 2 (two) times daily.  Marland Kitchen oxyCODONE-acetaminophen (PERCOCET) 5-325 MG tablet Take 1 tablet by mouth every 6 (six) hours as needed for severe pain.   No facility-administered encounter medications on file as of 05/04/2018.     Allergies (verified) Shrimp [shellfish allergy]; Bactroban [mupirocin]; Vicodin [hydrocodone-acetaminophen]; Lisinopril; and Tylenol [acetaminophen]   History: Past Medical History:  Diagnosis Date  . Atelectasis 2002   Bilateral  . Bone spur 2008   Right calcaneal foot spur  . Breast cancer ([place]) 2004   Ductal carcinoma in situ of the left breast; S/P left partial mastectomy 02/26/2003; S/P re-excision of cranial and lateral margins11/18/2004.radiation  . Breast cancer ([place]) 09/21/2012   right breast/ last radiation treatment 03/22/2013  . Chronic kidney disease, stage IV (severe) ([place]) 10/10/2007  . 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 Autumn Messing; S/P re-excision of superior and inferior margins 10/27/2012.   Marland Kitchen GERD (gastroesophageal reflux disease)   . 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 Hot flashes   . Hx of radiation therapy 2005   left  breast  . Hx of radiation therapy 01/11/13- 03/22/13   right breast 5760 cGy 30 sessions  . Hypertension   . Low back pain   . Lumbar spinal stenosis    S/P lumbar decompressive laminectomy, fusion and plating for lumbar spinal stensosis  . Normocytic anemia    With thrombocytosis  . Osteoarthritis   . Personal history of radiation therapy   . Right ureteral stone 2002  . Shortness of breath    from pain  . Uterine fibroid   . Wears dentures    top   Past Surgical History:  Procedure Laterality Date  . ANTERIOR LAT LUMBAR FUSION N/A 01/18/2014   Procedure:  ANTERIOR LATERAL LUMBAR FUSION LUMBAR TWO-THREE;  Surgeon: Eustace Moore, MD;  Location: [facility];  Service: Neurosurgery;  Laterality: N/A;  . ANTERIOR LUMBAR FUSION  01/18/2014  . BACK SURGERY    . BREAST LUMPECTOMY Left 01/2003  . BREAST LUMPECTOMY Right 2014  . BREAST LUMPECTOMY WITH NEEDLE LOCALIZATION AND AXILLARY SENTINEL LYMPH NODE BX Right 10/13/2012   Procedure: BREAST LUMPECTOMY WITH NEEDLE LOCALIZATION;  Surgeon: [name], MD;  Location: [facility];  Service: General;  Laterality: Right;  Right breast wire localized lumpectomy  . LAMINECTOMY  05/27/2009   Lumbar decompressive laminectomy, fusion and plating for lumbar spinal stensosis  . LUMBAR LAMINECTOMY/DECOMPRESSION MICRODISCECTOMY Left 03/23/2013   Procedure: LUMBAR LAMINECTOMY/DECOMPRESSION MICRODISCECTOMY 1 LEVEL;  Surgeon: Eustace Moore, MD;  Location: [place] [facility];  Service: Neurosurgery;  Laterality: Left;  LUMBAR LAMINECTOMY/DECOMPRESSION MICRODISCECTOMY 1 LEVEL  . MASTECTOMY, PARTIAL Left 02/26/2003   ; S/P re-excision of cranial and lateral margins 04/19/2003.   Marland Kitchen RE-EXCISION OF BREAST CANCER,SUPERIOR MARGINS Right 10/27/2012   Procedure: RE-EXCISION OF BREAST CANCER,SUPERIOR and inferior MARGINS;  Surgeon: [name], MD;  Location: [facility];  Service: General;  Laterality: Right;  . RE-EXCISION OF BREAST LUMPECTOMY Left 04/2003   Family History  Problem Relation Age of Onset  . Colon cancer Mother 66  . Hypertension Mother   . Diabetes Sister 33  . Hypertension Sister   . Diabetes Brother   . Hypertension Brother   . Diabetes Brother   . Hypertension Brother   . Kidney disease Son        On dialysis  . Hypertension Son   . Diabetes Son   . Multiple sclerosis Son   . Bone cancer Sister 47  . Breast cancer Neg Hx   . Cervical cancer Neg Hx    Social History   Socioeconomic History  . Marital status: Single    Spouse name: Not on file  . Number of children: Not on file  . Years  of education: Not on file  . Highest education level: Not on file  Occupational History  . Not on file  Social Needs  . Financial resource strain: Not on file  . Food insecurity:    Worry: Not on file    Inability: Not on file  . Transportation needs:    Medical: Not on file    Non-medical: Not on file  Tobacco Use  . Smoking status: Current Some Day Smoker    Packs/day: 0.25    Years: 44.00    Pack years: 11.00    Types: Cigarettes  . Smokeless tobacco: Never Used  . Tobacco comment: smoking less  Substance and Sexual Activity  . Alcohol use: Yes    Alcohol/week: 2.0 standard drinks    Types: 2 Cans of  beer per week    Comment:  "couple beers q weekend"  . Drug use: Yes    Types: Cocaine    Comment: "stopped back in the 1980's"  . Sexual activity: Yes    Birth control/protection: Post-menopausal  Lifestyle  . Physical activity:    Days per week: Not on file    Minutes per session: Not on file  . Stress: Not on file  Relationships  . Social connections:    Talks on phone: Not on file    Gets together: Not on file    Attends religious service: Not on file    Active member of club or organization: Not on file    Attends meetings of clubs or organizations: Not on file    Relationship status: Not on file  Other Topics Concern  . Not on file  Social History Narrative  . Not on file    Tobacco Counseling Ready to quit: Not Answered Counseling given: Not Answered Comment: smoking less   Clinical Intake:  Activities of Daily Living In your present state of health, do you have any difficulty performing the following activities: 05/04/2018  Hearing? N  Vision? N  Difficulty concentrating or making decisions? N  Walking or climbing stairs? N  Dressing or bathing? N  Doing errands, shopping? N  Some recent data might be hidden     Immunizations and Health Maintenance - was immune to hepatitis B when checked 01/2017 and has been treated with hep C Immunization  History  Administered Date(s) Administered  . Hepatitis A, Adult 01/18/2017  . Pneumococcal Conjugate-13 12/22/2017   Health Maintenance Due  Topic Date Due  . TETANUS/TDAP  09/02/1971  . PAP SMEAR  04/07/2014  Declines today but agrees to return to that.   Patient Care Team: [name], MD as PCP - General (Family Medicine) Jeanella Anton, NP as Nurse Practitioner (Pain Medicine)  Indicate any recent Medical Services you may have received from other than [facility] providers in the past year (date may be approximate).     Assessment:   This is a routine wellness examination for [place].  Hearing/Vision screen  Visual Acuity Screening   Right eye Left eye Both eyes  Without correction:     With correction: _0   Physical Exam  Constitutional: She is oriented to person, place, and time. She appears well-developed and well-nourished. No distress.  HENT:  Head: Normocephalic and atraumatic.  Right Ear: Tympanic membrane, external ear and ear canal normal.  Left Ear: Tympanic membrane, external ear and ear canal normal.  Nose: Nose normal. No mucosal edema or rhinorrhea.  Mouth/Throat: Uvula is midline, oropharynx is clear and moist and mucous membranes are normal. No posterior oropharyngeal erythema.  Eyes: Pupils are equal, round, and reactive to light. Conjunctivae and EOM are normal. Right eye exhibits no discharge. Left eye exhibits no discharge. No scleral icterus.  Neck: Normal range of motion. Neck supple. No thyromegaly present.  Cardiovascular: Normal rate, regular rhythm, normal heart sounds and intact distal pulses.  Pulmonary/Chest: Effort normal and breath sounds normal. No respiratory distress.  Breast with lumpectomy scars bilaterally, mild tenderness throughout with fibrocystic changes.  However no concerning masses or adenopathy noted.  No nipple discharge or skin changes. Abdominal: Soft. Bowel sounds are normal. There is no tenderness.    Musculoskeletal: She exhibits no edema.  Lymphadenopathy:    She has no cervical, supraclavicular, or axillary adenopathy.  Neurological: She is alert and oriented to person, place,  and time. She has normal reflexes.  Skin: Skin is warm and dry. She is not diaphoretic. No erythema.  Psychiatric: She has a normal mood and affect. Her behavior is normal.   Dietary issues and exercise activities discussed:  Good appetite, avoids salt but otherwise eats anything - 2 meals a day with breakfast and lunch. Does exercise with home calesthenics and walking.  Drinks a lot of water  Smoking intermittently - where she is living she cna't smoke inside so trying ot let it go  Goals    . Blood Pressure < 140/90    . Quit smoking / using tobacco      Depression Screen PHQ 2/9 Scores 05/04/2018 12/22/2017 08/16/2017 07/19/2017 12/09/2016 01/20/2016 06/11/2015  PHQ - 2 Score 1 0 0 0 0 0 0    Fall Risk Fall Risk  05/04/2018 12/22/2017 08/16/2017 07/19/2017 12/09/2016  Falls in the past year? 0 No No No No  Number falls in past yr: 0 - - - -  Injury with Fall? 0 - - - -  Risk for fall due to : - - - - -  Risk for fall due to: Comment - - - - -  Follow up - - - - -    Is the patient's home free of loose throw rugs in walkways, pet beds, electrical cords, etc?   no      Grab bars in the bathroom? yes      Handrails on the stairs?   yes - has a chair rail      Adequate lighting?   yes  Timed Get Up and Go Performed Normal  Cognitive Function: 6CIT Screen 05/04/2018  What Year? 0 points  What month? 0 points  What time? 0 points  Count back from 20 2 points  Months in reverse 2 points  Repeat phrase 0 points  Total Score 4         Screening Tests Health Maintenance  Topic Date Due  . TETANUS/TDAP  09/02/1971  . PAP SMEAR  04/07/2014  . INFLUENZA VACCINE  11/21/2018 (Originally 12/30/2017)  . PNA vac Low Risk Adult (2 of 2 - PPSV23) 12/23/2018  . MAMMOGRAM  04/19/2020  . COLONOSCOPY  02/24/2021   . DEXA SCAN  Completed  . Hepatitis C Screening  Completed  . HIV Screening  Completed    Qualifies for Shingles Vaccine? Yes  Cancer Screenings: Lung: Low Dose CT Chest recommended if Age 42-80 years, 30 pack-year currently smoking OR have quit w/in 15years. Patient does not qualify. Breast: Up to date on Mammogram? Yes  Done 04/19/18  Normal at breast center Up to date of Bone Density/Dexa? Yes  Done 04/19/18 Normal at breast center T score -.0.7  Colorectal: declines colonoscopy but not cologuard  Additional Screenings: Hepatitis C Screening: H/o hep C that has been treated x 3 mos  Is not fasting today - she had cream and sugar in her coffee but has not eaten food  Has a ~16 pack  Year smoking hx - started at 67 yo but never smoked more than 1 ppd every 3 days   Plan:     I have personally reviewed and noted the following in the patient's chart:   . Medical and social history . Use of alcohol, tobacco or illicit drugs  . Current medications and supplements . Functional ability and status . Nutritional status . Physical activity . Advanced directives . List of other physicians . Hospitalizations, surgeries, and ER visits in  previous 12 months . Vitals . Screenings to include cognitive, depression, and falls . Referrals and appointments  In addition, I have reviewed and discussed with patient certain preventive protocols, quality metrics, and best practice recommendations. A written personalized care plan for preventive services as well as general preventive health recommendations were provided to patient.   1. Medicare annual wellness visit, initial -schedule CPE with Pap smear  2. Essential hypertension -remarkably well controlled on current regimen.  Continue clonidine 0.2 twice daily, amlodipine 10 daily, and carvedilol 12.5 twice daily.  Refill x3 months whenever requested  3. Chronic obstructive pulmonary disease, unspecified COPD type ([place])   4. Tobacco use  disorder - encouraged cessation  5. Chronic bilateral low back pain without sciatica   6. Chronic pain syndrome   7. Screening for colorectal cancer   8. Chronic kidney disease (CKD) stage G4/A1, severely decreased glomerular filtration rate (GFR) between 15-29 mL/min/1.73 square meter and albuminuria creatinine ratio less than 30 mg/g [facility]) -patient denies ever seen nephrology despite repeated conversations about its importance.  Again reinforced need to follow-up with nephrology ASAP to learn about dialysis options and if she would be interested in this and need to prepare for due to sudden decrease in EGFR unexpectedly over the past year.  Reviewed that this her renal function continues to decrease with the rate they have this past year, she will likely be dead within a year unless she considers dialysis.  Patient agrees to schedule appointment with nephrology to review her options.  Need to adjust her medication to be consistent with her decreased new E GFR of stage IV less than 20 creatinine clearance.  Famotidine decreased from 28-10 nightly.  Patient currently taking gabapentin 600 mg twice daily.  Advised patient she would have to decrease to 300 daily as max dose but already stating that pain is uncontrolled and this medication is ineffective.  Therefore we will go ahead and switch her to pregabalin which she could be on as high of a dose as 150 mg a day total so we will prescribe 75 twice daily.  Patient's pain management provider Jeanella Anton at [facility] clinic informed of change in that patient was requesting tramadol from our office which she was declined since she is being maintained on oxycodone.  9. Pure hypercholesterolemia   10. Insomnia, unspecified type patient requests something for sleep at home visit.  Advised patient she will need to scheduling new appointment to address this issue as it findings have been safe could be difficult due to her severely decreased kidney function.     Orders Placed This Encounter  Procedures  . Hepatitis A vaccine adult IM  . Cologuard  . CBC with Differential/Platelet  . Comprehensive metabolic panel    Order Specific Question:   Has the patient fasted?    Answer:   Yes  . Lipid panel    Order Specific Question:   Has the patient fasted?    Answer:   Yes  . Ambulatory referral to Nephrology    Referral Priority:   Routine    Referral Type:   Consultation    Referral Reason:   Specialty Services Required    Requested Specialty:   Nephrology    Number of Visits Requested:   1  . POCT urinalysis dipstick   Meds ordered this encounter  Medications  . Zoster Vaccine Adjuvanted [facility]) injection    Sig: Inject 0.5 mLs into the muscle once for 1 dose. Repeat once in 2  to 6 months    Dispense:  0.5 mL    Refill:  1  . cloNIDine (CATAPRES) 0.2 MG tablet    Sig: Take 1 tablet (0.2 mg total) by mouth 2 (two) times daily.    Dispense:  180 tablet    Refill:  1  . famotidine (PEPCID) 10 MG tablet    Sig: Take 1 tablet (10 mg total) by mouth at bedtime.    Dispense:  90 tablet    Refill:  1    Please consider 90 day supplies to promote better adherence  . pregabalin (LYRICA) 75 MG capsule    Sig: Take 1 capsule (75 mg total) by mouth 2 (two) times daily.    Dispense:  60 capsule    Refill:  2    Delman Cheadle, MD, MPH [facility] [place] [address] [place], [place]  [address] [phone] Office phone  [phone] Office fax   05/04/18 9:22 AM   [name], MD   05/04/2018

## 2018-05-04 NOTE — Patient Instructions (Addendum)
You need to go see the kidney doctors immediately -this is a life or death condition.  Please call [facility] to schedule your appointment ASAP.  If you are can kidneys continue to fail the weighted have done over this past year, you will likely not make it for another year unless you consider dialysis.  It is my hope and it is possible that they may stay stable at the current level, but we have no idea if this will be the case or not, so it is best to prepare and learn about all your options in case your kidneys continue to worsen  [facility] [address] [place], [place] [address]  Phone: [phone]    If you have lab work done today you will be contacted with your lab results within the next 2 weeks.  If you have not heard from [place] then please contact us. The fastest way to get your results is to register for My Chart.   IF you received an x-ray today, you will receive an invoice from [facility]. Please contact [facility] at [phone] with questions or concerns regarding your invoice.   IF you received labwork today, you will receive an invoice from [place]. Please contact [facility] at [phone] with questions or concerns regarding your invoice.   Our billing staff will not be able to assist you with questions regarding bills from these companies.  You will be contacted with the lab results as soon as they are available. The fastest way to get your results is to activate your My Chart account. Instructions are located on the last page of this paperwork. If you have not heard from [place] regarding the results in 2 weeks, please contact this office.     Chronic Kidney Disease, Adult Chronic kidney disease (CKD) occurs when the kidneys become damaged slowly over a long period of time. The kidneys are a pair of organs that do many important jobs in the body, including:  Removing waste and extra fluid from the blood to make urine.  Making  hormones that maintain the amount of fluid in tissues and blood vessels.  Maintaining the right amount of fluids and chemicals in the body.  A small amount of kidney damage may not cause problems, but a large amount of damage may make it hard or impossible for the kidneys to work the way they should. If steps are not taken to slow down kidney damage or to stop it from getting worse, the kidneys may stop working permanently (end-stage renal disease or ESRD). Most of the time, CKD does not go away, but it can often be controlled. People who have CKD are usually able to live normal lives. What are the causes? The most common causes of this condition are diabetes and high blood pressure (hypertension). Other causes include:  Heart and blood vessel (cardiovascular) disease.  Kidney diseases, such as: ? Glomerulonephritis. ? Interstitial nephritis. ? Polycystic kidney disease. ? Renal vascular disease.  Diseases that affect the immune system.  Genetic diseases.  Medicines that damage the kidneys, such as anti-inflammatory medicines.  Being around or being in contact with poisonous (toxic) substances.  A kidney or urinary infection that occurs again and again (recurs).  Vasculitis. This is swelling or inflammation of the blood vessels.  A problem with urine flow that may be caused by: ? Cancer. ? Having kidney stones more than one time. ? An enlarged prostate, in males.  What increases the risk? You are more  likely to develop this condition if you:  Are older than age 67.  Are female.  Are African-American, Hispanic, Asian, [place], or American [place].  Are a current or former smoker.  Are obese.  Have a family history of kidney disease or failure.  Often take medicines that are damaging to the kidneys.  What are the signs or symptoms? Symptoms of this condition include:  Swelling (edema) of the face, legs, ankles, or feet.  Tiredness (lethargy) and having  less energy.  Nausea or vomiting.  Confusion or trouble concentrating.  Problems with urination, such as: ? Painful or burning feeling during urination. ? Decreased urine production. ? Frequent urination, especially at night. ? Bloody urine.  Muscle twitches and cramps, especially in the legs.  Shortness of breath.  Weakness.  Loss of appetite.  Metallic taste in the mouth.  Trouble sleeping.  Dry, itchy skin.  A low blood count (anemia).  Pale lining of the eyelids and surface of the eye (conjunctiva).  Symptoms develop slowly and may not be obvious until the kidney damage becomes severe. It is possible to have kidney disease for years without having any symptoms. How is this diagnosed? This condition may be diagnosed based on:  Blood tests.  Urine tests.  Imaging tests, such as an ultrasound or CT scan.  A test in which a sample of tissue is removed from the kidneys to be examined under a microscope (kidney biopsy).  These test results will help your health care provider determine how serious the CKD is. How is this treated? There is no cure for most cases of this condition, but treatment usually relieves symptoms and prevents or slows the progression of the disease. Treatment may include:  Making diet changes, which may require you to avoid alcohol, salty foods (sodium), and foods that are high in potassium, calcium, and protein.  Medicines: ? To lower blood pressure. ? To control blood glucose. ? To relieve anemia. ? To relieve swelling. ? To protect your bones. ? To improve the balance of electrolytes in your blood.  Removing toxic waste from the body through types of dialysis, if the kidneys can no longer do their job (kidney failure).  Managing any other conditions that are causing your CKD or making it worse.  Follow these instructions at home: Medicines  Take over-the-counter and prescription medicines only as told by your health care provider.  The dose of some medicines that you take may need to be adjusted.  Do not take any new medicines unless approved by your health care provider. Many medicines can worsen your kidney damage.  Do not take any vitamin and mineral supplements unless approved by your health care provider. Many nutritional supplements can worsen your kidney damage. General instructions  Follow your prescribed diet as told by your health care provider.  Do not use any products that contain nicotine or tobacco, such as cigarettes and e-cigarettes. If you need help quitting, ask your health care provider.  Monitor and track your blood pressure at home. Report changes in your blood pressure as told by your health care provider.  If you are being treated for diabetes, monitor and track your blood sugar (blood glucose) levels as told by your health care provider.  Maintain a healthy weight. If you need help with this, ask your health care provider.  Start or continue an exercise plan. Exercise at least 30 minutes a day, 5 days a week.  Keep your immunizations up to date as told by  your health care provider.  Keep all follow-up visits as told by your health care provider. This is important. Where to find more information:  American Association of Kidney Patients: BombTimer.gl  National Kidney Foundation: www.kidney.[place]: https://mathis.com/  [facility]: www.lifeoptions.org and www.kidneyschool.org Contact a health care provider if:  Your symptoms get worse.  You develop new symptoms. Get help right away if:  You develop symptoms of ESRD, which include: ? Headaches. ? Numbness in the hands or feet. ? Easy bruising. ? Frequent hiccups. ? Chest pain. ? Shortness of breath. ? Lack of menstruation, in women.  You have a fever.  You have decreased urine production.  You have pain or bleeding when you urinate. Summary  Chronic kidney disease (CKD) occurs when  the kidneys become damaged slowly over a long period of time.  The most common causes of this condition are diabetes and high blood pressure (hypertension).  There is no cure for most cases of this condition, but treatment usually relieves symptoms and prevents or slows the progression of the disease. Treatment may include a combination of medicines and lifestyle changes. This information is not intended to replace advice given to you by your health care provider. Make sure you discuss any questions you have with your health care provider. Document Released: 02/25/2008 Document Revised: 06/25/2016 Document Reviewed: 06/25/2016 Elsevier Interactive Patient Education  2018 [place] for Chronic Kidney Disease When your kidneys are not working well, they cannot remove waste and excess substances from your blood as effectively as they did before. This can lead to a buildup and imbalance of these substances, which can worsen kidney damage and affect how your body functions. Certain foods lead to a buildup of these substances in the body. By changing your diet as recommended by your diet and nutrition specialist (dietitian) or health care provider, you could help prevent further kidney damage and delay or prevent the need for dialysis. What are tips for following this plan? General instructions  Work with your health care provider and dietitian to develop a meal plan that is right for you. Foods you can eat, limit, or avoid will be different for each person depending on the stage of kidney disease and any other existing health conditions.  Talk with your health care provider about whether you should take a vitamin and mineral supplement.  Use standard measuring cups and spoons to measure servings of foods. Use a kitchen scale to measure portions of protein foods.  If directed by your health care provider, avoid drinking too much fluid. Measure and count all liquids, including water,  ice, soups, flavored gelatin, and frozen desserts such as popsicles or ice cream. Reading food labels  Check the amount of sodium in foods. Choose foods that have less than 300 milligrams (mg) per serving.  Check the ingredient list for phosphorus or potassium-based additives or preservatives.  Check the amount of saturated and trans fat. Limit or avoid these fats as told by your dietitian. Shopping  Avoid buying foods that are: ? Processed, frozen, or prepackaged. ? Calcium-enriched or fortified.  Do not buy foods that have salt or sodium listed among the first five ingredients.  Do not buy canned vegetables. Cooking  Replace animal proteins, such as meat, fish, eggs, or dairy, with plant proteins from beans, nuts, and soy. ? Use soy milk instead of cow's milk. ? Add beans or tofu to soups, casseroles, or pasta dishes instead of meat.  Soak  vegetables, such as potatoes, before cooking to reduce potassium. To do this: ? Peel and cut into small pieces. ? Soak in warm water for at least 2 hours. For every 1 cup of vegetables, use 10 cups of water. ? Drain and rinse with warm water. ? Boil for at least 5 minutes. Meal planning  Limit the amount of protein from plant and animal sources you eat each day.  Do not add salt to food when cooking or before eating.  Eat meals and snacks at around the same time each day. If you have diabetes:  If you have diabetes (diabetes mellitus) and chronic kidney disease, it is important to keep your blood glucose in the target range recommended by your health care provider. Follow your diabetes management plan. This may include: ? Checking your blood glucose regularly. ? Taking oral medicines, insulin, or both. ? Exercising for at least 30 minutes on 5 or more days each week, or as told by your health care provider. ? Tracking how many servings of carbohydrates you eat at each meal.  You may be given specific guidelines on how much of certain  foods and nutrients you may eat, depending on your stage of kidney disease and whether you have high blood pressure (hypertension). Follow your meal plan as told by your dietitian. What nutrients should be limited? The items listed are not a complete list. Talk with your dietitian about what dietary choices are best for you. Potassium Potassium affects how steadily your heart beats. If too much potassium builds up in your blood, it can cause an irregular heartbeat or even a heart attack. You may need to eat less potassium, depending on your blood potassium levels and the stage of kidney disease. Talk to your dietitian about how much potassium you may have each day. You may need to limit or avoid foods that are high in potassium, such as:  Milk and soy milk.  Fruits, such as bananas, papaya, apricots, nectarines, melon, prunes, raisins, kiwi, and oranges.  Vegetables, such as potatoes, sweet potatoes, yams, tomatoes, leafy greens, beets, okra, avocado, pumpkin, and winter squash.  White and lima beans.  Phosphorus Phosphorus is a mineral found in your bones. A balance between calcium and phosphorous is needed to build and maintain healthy bones. Too much phosphorus pulls calcium from your bones. This can make your bones weak and more likely to break. Too much phosphorus can also make your skin itch. You may need to eat less phosphorus depending on your blood phosphorus levels and the stage of kidney disease. Talk to your dietitian about how much potassium you may have each day. You may need to take medicine to lower your blood phosphorus levels if diet changes do not help. You may need to limit or avoid foods that are high in phosphorus, such as:  Milk and dairy products.  Dried beans and peas.  Tofu, soy milk, and other soy-based meat replacements.  Colas.  Nuts and peanut butter.  Meat, poultry, and fish.  Bran cereals and oatmeals.  Protein Protein helps you to make and keep  muscle. It also helps in the repair of your body's cells and tissues. One of the natural breakdown products of protein is a waste product called urea. When your kidneys are not working properly, they cannot remove wastes, such as urea, like they did before you developed chronic kidney disease. Reducing how much protein you eat can help prevent a buildup of urea in your blood. Depending on your  stage of kidney disease, you may need to limit foods that are high in protein. Sources of animal protein include:  Meat (all types).  Fish and seafood.  Poultry.  Eggs.  Dairy.  Other protein foods include:  Beans and legumes.  Nuts and nut butter.  Soy and tofu.  Sodium Sodium, which is found in salt, helps maintain a healthy balance of fluids in your body. Too much sodium can increase your blood pressure and have a negative effect on the function of your heart and lungs. Too much sodium can also cause your body to retain too much fluid, making your kidneys work harder. Most people should have less than 2,300 milligrams (mg) of sodium each day. If you have hypertension, you may need to limit your sodium to 1,500 mg each day. Talk to your dietitian about how much sodium you may have each day. You may need to limit or avoid foods that are high in sodium, such as:  Salt seasonings.  Soy sauce.  Cured and processed meats.  Salted crackers and snack foods.  Fast food.  Canned soups and most canned foods.  Pickled foods.  Vegetable juice.  Boxed mixes or ready-to-eat boxed meals and side dishes.  Bottled dressings, sauces, and marinades.  Summary  Chronic kidney disease can lead to a buildup and imbalance of waste and excess substances in the body. Certain foods lead to a buildup of these substances. By adjusting your intake of these foods, you could help prevent more kidney damage and delay or prevent the need for dialysis.  Food adjustments are different for each person with  chronic kidney disease. Work with a dietitian to set up nutrient goals and a meal plan that is right for you.  If you have diabetes and chronic kidney disease, it is important to keep your blood glucose in the target range recommended by your health care provider. This information is not intended to replace advice given to you by your health care provider. Make sure you discuss any questions you have with your health care provider. Document Released: 08/08/2002 Document Revised: 05/13/2016 Document Reviewed: 05/13/2016 Elsevier Interactive Patient Education  [employer].

## 2018-05-26 DIAGNOSIS — G894 Chronic pain syndrome: Secondary | ICD-10-CM | POA: Diagnosis not present

## 2018-05-26 DIAGNOSIS — M5136 Other intervertebral disc degeneration, lumbar region: Secondary | ICD-10-CM | POA: Diagnosis not present

## 2018-05-26 DIAGNOSIS — Z79899 Other long term (current) drug therapy: Secondary | ICD-10-CM | POA: Diagnosis not present

## 2018-06-08 DIAGNOSIS — Z1212 Encounter for screening for malignant neoplasm of rectum: Secondary | ICD-10-CM | POA: Diagnosis not present

## 2018-06-08 DIAGNOSIS — Z1211 Encounter for screening for malignant neoplasm of colon: Secondary | ICD-10-CM | POA: Diagnosis not present

## 2018-06-15 LAB — COLOGUARD: COLOGUARD: NEGATIVE

## 2018-06-24 DIAGNOSIS — G894 Chronic pain syndrome: Secondary | ICD-10-CM | POA: Diagnosis not present

## 2018-06-24 DIAGNOSIS — M5136 Other intervertebral disc degeneration, lumbar region: Secondary | ICD-10-CM | POA: Diagnosis not present

## 2018-06-24 DIAGNOSIS — Z79899 Other long term (current) drug therapy: Secondary | ICD-10-CM | POA: Diagnosis not present

## 2018-06-24 DIAGNOSIS — F1120 Opioid dependence, uncomplicated: Secondary | ICD-10-CM | POA: Diagnosis not present

## 2018-07-25 DIAGNOSIS — G894 Chronic pain syndrome: Secondary | ICD-10-CM | POA: Diagnosis not present

## 2018-07-25 DIAGNOSIS — Z79899 Other long term (current) drug therapy: Secondary | ICD-10-CM | POA: Diagnosis not present

## 2018-07-25 DIAGNOSIS — M5136 Other intervertebral disc degeneration, lumbar region: Secondary | ICD-10-CM | POA: Diagnosis not present

## 2018-07-30 ENCOUNTER — Telehealth: Payer: Self-pay | Admitting: Family Medicine

## 2018-07-30 NOTE — Telephone Encounter (Signed)
Tried to call patient to let them know Dr. Brigitte Pulse is no longer at [place] [facility] and that their appointment with her is going to be cancelled. Their voicemail box was full so we could not leave a message. If patient calls back, please try to get them rescheduled with a different provider or let them know that Dr Brigitte Pulse is going to be working at [employer] the number there is [phone].

## 2018-08-05 ENCOUNTER — Encounter: Payer: Medicare HMO | Admitting: Family Medicine

## 2018-08-17 ENCOUNTER — Telehealth: Payer: Self-pay | Admitting: Family Medicine

## 2018-08-19 NOTE — Telephone Encounter (Signed)
error 

## 2018-09-08 ENCOUNTER — Other Ambulatory Visit: Payer: Self-pay | Admitting: Family Medicine

## 2018-09-08 NOTE — Telephone Encounter (Signed)
Requested medication (s) are due for refill today: Yes  Requested medication (s) are on the active medication list: Yes  Last refill:  09/26/17 and 05/2018  Future visit scheduled: Yes  Notes to clinic:  Unable to refill, last refilled by another provider.     Requested Prescriptions  Pending Prescriptions Disp Refills   VENTOLIN HFA 108 (90 Base) MCG/ACT inhaler [Pharmacy Med Name: VENTOLIN HFA 90 MCG/INH AEROSOL] 18 g     Sig: INHALE TWO PUFFS INTO LUNGS EVERY 4 HOURS AS NEEDED FOR WHEEZING FOR COUGH FOR SHORTNESS OF BREATH     Pulmonology:  Beta Agonists Failed - 09/08/2018  4:43 PM      Failed - One inhaler should last at least one month. If the patient is requesting refills earlier, contact the patient to check for uncontrolled symptoms.      Passed - Valid encounter within last 12 months    Recent Outpatient Visits          4 months ago Medicare annual wellness visit, initial   Primary Care at [facility], Laurey Arrow, MD   8 months ago Chronic kidney disease (CKD) stage G3b/A2, moderately decreased glomerular filtration rate (GFR) between 30-44 mL/min/1.73 square meter and albuminuria creatinine ratio between 30-299 mg/g [facility])   [facility] at [facility], Laurey Arrow, MD   1 year ago Chronic kidney disease (CKD) stage G3b/A2, moderately decreased glomerular filtration rate (GFR) between 30-44 mL/min/1.73 square meter and albuminuria creatinine ratio between 30-299 mg/g [facility])   Primary Care at [facility], Laurey Arrow, MD   1 year ago Essential hypertension   Primary Care at [facility], Laurey Arrow, MD   1 year ago Essential hypertension   Primary Care at [facility], Laurey Arrow, MD      Future Appointments            In 1 month [name], MD Primary Care at [place], [facility]          pregabalin (LYRICA) 75 MG capsule [Pharmacy Med Name: PREGABALIN 75MG  CAPSULE] 60 capsule     Sig: Take 1 capsule (75 mg total) by mouth 2 (two) times daily.     Not Delegated - Neurology:  Anticonvulsants -  Controlled Failed - 09/08/2018  4:43 PM      Failed - This refill cannot be delegated      Passed - Valid encounter within last 12 months    Recent Outpatient Visits          4 months ago Medicare annual wellness visit, initial   Primary Care at [facility], Laurey Arrow, MD   8 months ago Chronic kidney disease (CKD) stage G3b/A2, moderately decreased glomerular filtration rate (GFR) between 30-44 mL/min/1.73 square meter and albuminuria creatinine ratio between 30-299 mg/g [facility])   Primary Care at [facility], Laurey Arrow, MD   1 year ago Chronic kidney disease (CKD) stage G3b/A2, moderately decreased glomerular filtration rate (GFR) between 30-44 mL/min/1.73 square meter and albuminuria creatinine ratio between 30-299 mg/g [facility])   Primary Care at [facility], Laurey Arrow, MD   1 year ago Essential hypertension   Primary Care at [facility], Laurey Arrow, MD   1 year ago Essential hypertension   Primary Care at [facility], Laurey Arrow, MD      Future Appointments            In 1 month [name], MD Primary Care at [place], [facility]

## 2018-09-20 ENCOUNTER — Other Ambulatory Visit: Payer: Self-pay | Admitting: Family Medicine

## 2018-09-20 NOTE — Telephone Encounter (Signed)
Courtesy refill given. Appt scheduled with Dr. [name] on 10/14/18

## 2018-09-23 DIAGNOSIS — Z79899 Other long term (current) drug therapy: Secondary | ICD-10-CM | POA: Diagnosis not present

## 2018-09-23 DIAGNOSIS — G894 Chronic pain syndrome: Secondary | ICD-10-CM | POA: Diagnosis not present

## 2018-09-23 DIAGNOSIS — Z79891 Long term (current) use of opiate analgesic: Secondary | ICD-10-CM | POA: Diagnosis not present

## 2018-09-23 DIAGNOSIS — F1120 Opioid dependence, uncomplicated: Secondary | ICD-10-CM | POA: Diagnosis not present

## 2018-09-23 DIAGNOSIS — M5136 Other intervertebral disc degeneration, lumbar region: Secondary | ICD-10-CM | POA: Diagnosis not present

## 2018-10-14 ENCOUNTER — Telehealth: Payer: Medicare Other | Admitting: Family Medicine

## 2018-10-14 ENCOUNTER — Other Ambulatory Visit: Payer: Self-pay

## 2018-10-20 ENCOUNTER — Other Ambulatory Visit: Payer: Self-pay | Admitting: Nurse Practitioner

## 2018-10-20 DIAGNOSIS — M5136 Other intervertebral disc degeneration, lumbar region: Secondary | ICD-10-CM

## 2018-11-05 ENCOUNTER — Other Ambulatory Visit: Payer: Medicare HMO

## 2018-12-27 ENCOUNTER — Other Ambulatory Visit: Payer: Self-pay

## 2018-12-27 ENCOUNTER — Other Ambulatory Visit: Payer: Self-pay | Admitting: Nurse Practitioner

## 2018-12-27 ENCOUNTER — Ambulatory Visit
Admission: RE | Admit: 2018-12-27 | Discharge: 2018-12-27 | Disposition: A | Discharge status: Home / Self Care | Payer: Medicaid Other | Source: Ambulatory Visit | Attending: Nurse Practitioner | Admitting: Nurse Practitioner

## 2018-12-27 DIAGNOSIS — M5136 Other intervertebral disc degeneration, lumbar region: Secondary | ICD-10-CM

## 2019-01-20 ENCOUNTER — Ambulatory Visit: Payer: Medicare Other | Admitting: Family Medicine

## 2019-01-23 ENCOUNTER — Encounter: Payer: Self-pay | Admitting: Family Medicine

## 2019-02-01 ENCOUNTER — Telehealth: Payer: Self-pay | Admitting: Family Medicine

## 2019-02-01 NOTE — Telephone Encounter (Signed)
I spoke with patients pharmacy and they said that the patients insurance quit paying for the Ventolin inhaler but they will pay for Proair. So a new prescription needs to be sent over.

## 2019-02-02 MED ORDER — ALBUTEROL SULFATE HFA 108 (90 BASE) MCG/ACT IN AERS: 2.0000 | INHALATION_SPRAY | Freq: Four times a day (QID) | RESPIRATORY_TRACT | 5 refills | Status: DC | PRN

## 2019-02-07 ENCOUNTER — Ambulatory Visit: Payer: Medicare Other | Admitting: Family Medicine

## 2019-02-14 ENCOUNTER — Ambulatory Visit: Payer: Medicare Other | Admitting: Family Medicine

## 2019-02-15 ENCOUNTER — Encounter: Payer: Self-pay | Admitting: Family Medicine

## 2019-02-24 ENCOUNTER — Encounter: Payer: Self-pay | Admitting: Family Medicine

## 2019-02-24 ENCOUNTER — Other Ambulatory Visit: Payer: Self-pay

## 2019-02-24 ENCOUNTER — Ambulatory Visit (INDEPENDENT_AMBULATORY_CARE_PROVIDER_SITE_OTHER): Payer: Medicare Other | Admitting: Family Medicine

## 2019-02-24 VITALS — BP 114/60 | HR 99 | Temp 98.6°F | Ht 62.99 in | Wt 127.0 lb

## 2019-02-24 DIAGNOSIS — M545 Low back pain, unspecified: Secondary | ICD-10-CM

## 2019-02-24 DIAGNOSIS — K219 Gastro-esophageal reflux disease without esophagitis: Secondary | ICD-10-CM | POA: Diagnosis not present

## 2019-02-24 DIAGNOSIS — N184 Chronic kidney disease, stage 4 (severe): Secondary | ICD-10-CM

## 2019-02-24 DIAGNOSIS — I10 Essential (primary) hypertension: Secondary | ICD-10-CM

## 2019-02-24 DIAGNOSIS — F17200 Nicotine dependence, unspecified, uncomplicated: Secondary | ICD-10-CM

## 2019-02-24 DIAGNOSIS — G8929 Other chronic pain: Secondary | ICD-10-CM

## 2019-02-24 NOTE — Patient Instructions (Signed)
° ° ° °  If you have lab work done today you will be contacted with your lab results within the next 2 weeks.  If you have not heard from us then please contact us. The fastest way to get your results is to register for My Chart. ° ° °IF you received an x-ray today, you will receive an invoice from [facility]. Please contact [facility] at [phone] with questions or concerns regarding your invoice.  ° °IF you received labwork today, you will receive an invoice from [facility]. Please contact [facility] at [phone] with questions or concerns regarding your invoice.  ° °Our billing staff will not be able to assist you with questions regarding bills from these companies. ° °You will be contacted with the lab results as soon as they are available. The fastest way to get your results is to activate your My Chart account. Instructions are located on the last page of this paperwork. If you have not heard from us regarding the results in 2 weeks, please contact this office. °  ° ° ° °

## 2019-02-24 NOTE — Progress Notes (Signed)
9/25/202011:43 AM  Monique Henderson [birth year], 68 y.o., female [id number]  Chief Complaint  Patient presents with  . Follow-up    htn, declines flu, p23, td injection    HPI:   Patient is a 68 y.o. female with past medical history significant for HTN, CKD4, GERD, chronic low back pain s/p lumbar fusion, bilateral breast cancer s/p radiation, HCV s/p treatment/cure who presents today for transfer of care  Previous PCP Dr Brigitte Pulse Last OV Feb 2020  Pain mgt - Dr Berneice Gandy  Patient reports a mammogram a month or so, most likely at [facility] She reports onc has liberated her for surveillance thru PCP  Check BP at home 130-140/80s  Denies having COPD "I smoke a little bit" Not interested in [place]  Has never seen a nephrologist  pepcid works well for her GERD  Declines all immunizations  BP Readings from Last 3 Encounters:  05/04/18 120/72  12/22/17 138/84  08/16/17 134/84   Lab Results  Component Value Date   CREATININE 2.65 (H) 05/04/2018   BUN 27 05/04/2018   NA 142 05/04/2018   K 3.8 05/04/2018   CL 102 05/04/2018   CO2 23 05/04/2018   Lab Results  Component Value Date   CHOL 165 05/04/2018   HDL 46 05/04/2018   LDLCALC 92 05/04/2018   TRIG 134 05/04/2018   CHOLHDL 3.6 05/04/2018    Depression screen PHQ 2/9 02/24/2019 05/04/2018 12/22/2017  Decreased Interest 0 0 0  Down, Depressed, Hopeless 0 1 0  PHQ - 2 Score 0 1 0  Some recent data might be hidden    Fall Risk  02/24/2019 05/04/2018 12/22/2017 08/16/2017 07/19/2017  Falls in the past year? 0 0 No No No  Number falls in past yr: 0 0 - - -  Injury with Fall? 0 0 - - -  Risk for fall due to : - - - - -  Risk for fall due to: Comment - - - - -  Follow up - - - - -     Allergies  Allergen Reactions  . Shrimp [Shellfish Allergy] Shortness Of Breath  . Bactroban [Mupirocin] Other (See Comments)    "Sores in nose"  . Vicodin [Hydrocodone-Acetaminophen] Itching and Nausea And Vomiting    This is  patient's home medication  . Lisinopril Cough  . Tylenol [Acetaminophen] Itching    Prior to Admission medications   Medication Sig Start Date End Date Taking? Authorizing Provider  albuterol (VENTOLIN HFA) 108 (90 Base) MCG/ACT inhaler Inhale 2 puffs into the lungs every 6 (six) hours as needed for wheezing or shortness of breath. 02/02/19  Yes [name], MD  amLODipine (NORVASC) 10 MG tablet TAKE 1 TABLET (10 MG TOTAL) BY MOUTH DAILY. 09/20/18  Yes [name], MD  carvedilol (COREG) 12.5 MG tablet TAKE 1 TABLET (12.5 MG TOTAL) BY MOUTH 2 (TWO) TIMES DAILY WITH A MEAL. 03/22/18  Yes [name], MD  cloNIDine (CATAPRES) 0.2 MG tablet Take 1 tablet (0.2 mg total) by mouth 2 (two) times daily. 05/04/18  Yes [name], MD  famotidine (PEPCID) 10 MG tablet Take 1 tablet (10 mg total) by mouth at bedtime. 05/04/18  Yes [name], MD  oxyCODONE-acetaminophen (PERCOCET) 5-325 MG tablet Take 1 tablet by mouth every 6 (six) hours as needed for severe pain. 09/29/17  Yes Edrick Kins, DPM  pregabalin (LYRICA) 75 MG capsule TAKE 1 CAPSULE (75 MG TOTAL) BY MOUTH 2 (TWO) TIMES DAILY. 09/15/18  Yes Forrest Moron, MD    Past Medical History:  Diagnosis Date  . Atelectasis 2002   Bilateral  . Bone spur 2008   Right calcaneal foot spur  . Breast cancer ([place]) 2004   Ductal carcinoma in situ of the left breast; S/P left partial mastectomy 02/26/2003; S/P re-excision of cranial and lateral margins11/18/2004.radiation  . Breast cancer ([place]) 09/21/2012   right breast/ last radiation treatment 03/22/2013  . Chronic kidney disease, stage IV (severe) ([place]) 10/10/2007  . 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 Autumn Messing; S/P re-excision of superior and inferior margins 10/27/2012.   Marland Kitchen GERD (gastroesophageal reflux disease)   . 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 Hot flashes   . Hx of radiation therapy 2005    left breast  . Hx of radiation therapy 01/11/13- 03/22/13   right breast 5760 cGy 30 sessions  . Hypertension   . Low back pain   . Lumbar spinal stenosis    S/P lumbar decompressive laminectomy, fusion and plating for lumbar spinal stensosis  . Normocytic anemia    With thrombocytosis  . Osteoarthritis   . Personal history of radiation therapy   . Right ureteral stone 2002  . Shortness of breath    from pain  . Uterine fibroid   . Wears dentures    top    Past Surgical History:  Procedure Laterality Date  . ANTERIOR LAT LUMBAR FUSION N/A 01/18/2014   Procedure: ANTERIOR LATERAL LUMBAR FUSION LUMBAR TWO-THREE;  Surgeon: Eustace Moore, MD;  Location: [facility];  Service: Neurosurgery;  Laterality: N/A;  . ANTERIOR LUMBAR FUSION  01/18/2014  . BACK SURGERY    . BREAST LUMPECTOMY Left 01/2003  . BREAST LUMPECTOMY Right 2014  . BREAST LUMPECTOMY WITH NEEDLE LOCALIZATION AND AXILLARY SENTINEL LYMPH NODE BX Right 10/13/2012   Procedure: BREAST LUMPECTOMY WITH NEEDLE LOCALIZATION;  Surgeon: [name], MD;  Location: [facility];  Service: General;  Laterality: Right;  Right breast wire localized lumpectomy  . LAMINECTOMY  05/27/2009   Lumbar decompressive laminectomy, fusion and plating for lumbar spinal stensosis  . LUMBAR LAMINECTOMY/DECOMPRESSION MICRODISCECTOMY Left 03/23/2013   Procedure: LUMBAR LAMINECTOMY/DECOMPRESSION MICRODISCECTOMY 1 LEVEL;  Surgeon: Eustace Moore, MD;  Location: [facility];  Service: Neurosurgery;  Laterality: Left;  LUMBAR LAMINECTOMY/DECOMPRESSION MICRODISCECTOMY 1 LEVEL  . MASTECTOMY, PARTIAL Left 02/26/2003   ; S/P re-excision of cranial and lateral margins 04/19/2003.   Marland Kitchen RE-EXCISION OF BREAST CANCER,SUPERIOR MARGINS Right 10/27/2012   Procedure: RE-EXCISION OF BREAST CANCER,SUPERIOR and inferior MARGINS;  Surgeon: [name], MD;  Location: [facility];  Service: General;  Laterality: Right;  . RE-EXCISION OF BREAST LUMPECTOMY Left  04/2003    Social History   Tobacco Use  . Smoking status: Current Some Day Smoker    Packs/day: 0.25    Years: 44.00    Pack years: 11.00    Types: Cigarettes  . Smokeless tobacco: Never Used  . Tobacco comment: smoking less  Substance Use Topics  . Alcohol use: Yes    Alcohol/week: 2.0 standard drinks    Types: 2 Cans of beer per week    Comment:  "couple beers q weekend"    Family History  Problem Relation Age of Onset  . Colon cancer Mother 30  . Hypertension Mother   . Diabetes Sister 60  . Hypertension Sister   .  Diabetes Brother   . Hypertension Brother   . Diabetes Brother   . Hypertension Brother   . Kidney disease Son        On dialysis  . Hypertension Son   . Diabetes Son   . Multiple sclerosis Son   . Bone cancer Sister 25  . Breast cancer Neg Hx   . Cervical cancer Neg Hx     Review of Systems  Constitutional: Negative for chills and fever.  Respiratory: Negative for cough and shortness of breath.   Cardiovascular: Negative for chest pain, palpitations and leg swelling.  Gastrointestinal: Negative for abdominal pain, nausea and vomiting.  Musculoskeletal: Positive for back pain.  Neurological: Positive for tingling.     OBJECTIVE:  Today's Vitals   02/24/19 1140 02/24/19 1216  BP: 114/60 114/60  Pulse: 99   Temp: 98.6 F (37 C)   SpO2: 96%   Weight: 127 lb (57.6 kg)   Height: 5' 2.99" (1.6 m)    Body mass index is 22.5 kg/m.   Physical Exam Vitals signs and nursing note reviewed.  Constitutional:      Appearance: She is well-developed.  HENT:     Head: Normocephalic and atraumatic.     Mouth/Throat:     Pharynx: No oropharyngeal exudate.  Eyes:     General: No scleral icterus.    Conjunctiva/sclera: Conjunctivae normal.     Pupils: Pupils are equal, round, and reactive to light.  Neck:     Musculoskeletal: Neck supple.  Cardiovascular:     Rate and Rhythm: Normal rate and regular rhythm.     Heart sounds: Normal heart  sounds. No murmur. No friction rub. No gallop.   Pulmonary:     Effort: Pulmonary effort is normal.     Breath sounds: Normal breath sounds. No wheezing or rales.  Abdominal:     General: Bowel sounds are normal. There is no distension.     Palpations: Abdomen is soft. There is no mass.     Tenderness: There is no abdominal tenderness.  Musculoskeletal:     Right lower leg: No edema.     Left lower leg: No edema.  Skin:    General: Skin is warm and dry.  Neurological:     Mental Status: She is alert and oriented to person, place, and time.  Psychiatric:        Mood and Affect: Mood normal.        Behavior: Behavior normal.     No results found for this or any previous visit (from the past 24 hour(s)).  No results found.   ASSESSMENT and PLAN  1. Essential hypertension Controlled. Continue current regime.  - Lipid panel - CMP14+EGFR - Care order/instruction:  2. Chronic kidney disease (CKD) stage G4/A1, severely decreased glomerular filtration rate (GFR) between 15-29 mL/min/1.73 square meter and albuminuria creatinine ratio less than 30 mg/g (HCC) Labs pending. Referring to renal.  - CBC - Parathyroid hormone, intact (no Ca) - Phosphorus - Vitamin D, 25-hydroxy - Ambulatory referral to Nephrology  3. Gastroesophageal reflux disease, esophagitis presence not specified Stable. Cont currrent mgt  4. Chronic bilateral low back pain without sciatica Managed by pain mgt  5. Tobacco use disorder precontemplative  Return in about 6 months (around 08/24/2019).    [name], MD [facility] at [facility], [place] [address] Ph.  [phone] Fax [phone]

## 2019-02-25 LAB — CMP14+EGFR
ALT: 10 IU/L (ref 0–32)
AST: 18 IU/L (ref 0–40)
Albumin/Globulin Ratio: 1.7 (ref 1.2–2.2)
Albumin: 4.2 g/dL (ref 3.8–4.8)
Alkaline Phosphatase: 82 IU/L (ref 39–117)
BUN/Creatinine Ratio: 13 (ref 12–28)
BUN: 31 mg/dL — ABNORMAL HIGH (ref 8–27)
Bilirubin Total: 0.3 mg/dL (ref 0.0–1.2)
CO2: 24 mmol/L (ref 20–29)
Calcium: 9.3 mg/dL (ref 8.7–10.3)
Chloride: 103 mmol/L (ref 96–106)
Creatinine, Ser: 2.39 mg/dL — ABNORMAL HIGH (ref 0.57–1.00)
GFR calc Af Amer: 24 mL/min/{1.73_m2} — ABNORMAL LOW (ref >59)
GFR calc non Af Amer: 21 mL/min/{1.73_m2} — ABNORMAL LOW (ref >59)
Globulin, Total: 2.5 g/dL (ref 1.5–4.5)
Glucose: 89 mg/dL (ref 65–99)
Potassium: 4 mmol/L (ref 3.5–5.2)
Sodium: 143 mmol/L (ref 134–144)
Total Protein: 6.7 g/dL (ref 6.0–8.5)

## 2019-02-25 LAB — CBC
Hematocrit: 36.1 % (ref 34.0–46.6)
Hemoglobin: 11.7 g/dL (ref 11.1–15.9)
MCH: 28.5 pg (ref 26.6–33.0)
MCHC: 32.4 g/dL (ref 31.5–35.7)
MCV: 88 fL (ref 79–97)
Platelets: 365 10*3/uL (ref 150–450)
RBC: 4.1 x10E6/uL (ref 3.77–5.28)
RDW: 13.9 % (ref 11.7–15.4)
WBC: 5.8 10*3/uL (ref 3.4–10.8)

## 2019-02-25 LAB — LIPID PANEL
Chol/HDL Ratio: 2.9 ratio (ref 0.0–4.4)
Cholesterol, Total: 156 mg/dL (ref 100–199)
HDL: 54 mg/dL (ref >39)
LDL Chol Calc (NIH): 89 mg/dL (ref 0–99)
Triglycerides: 65 mg/dL (ref 0–149)
VLDL Cholesterol Cal: 13 mg/dL (ref 5–40)

## 2019-02-25 LAB — PHOSPHORUS: Phosphorus: 4.1 mg/dL (ref 3.0–4.3)

## 2019-02-25 LAB — PARATHYROID HORMONE, INTACT (NO CA): PTH: 106 pg/mL — ABNORMAL HIGH (ref 15–65)

## 2019-02-25 LAB — VITAMIN D 25 HYDROXY (VIT D DEFICIENCY, FRACTURES): Vit D, 25-Hydroxy: 18 ng/mL — ABNORMAL LOW (ref 30.0–100.0)

## 2019-04-03 ENCOUNTER — Encounter (HOSPITAL_COMMUNITY): Payer: Self-pay | Admitting: Emergency Medicine

## 2019-04-03 ENCOUNTER — Emergency Department (HOSPITAL_COMMUNITY): Payer: Medicare Other

## 2019-04-03 ENCOUNTER — Inpatient Hospital Stay (HOSPITAL_COMMUNITY)
Admission: EM | Admit: 2019-04-03 | Discharge: 2019-04-23 | DRG: 219 | Disposition: A | Discharge status: Home / Self Care | Payer: Medicare Other | Attending: Cardiothoracic Surgery | Admitting: Cardiothoracic Surgery

## 2019-04-03 ENCOUNTER — Other Ambulatory Visit: Payer: Self-pay

## 2019-04-03 DIAGNOSIS — N186 End stage renal disease: Secondary | ICD-10-CM | POA: Diagnosis present

## 2019-04-03 DIAGNOSIS — K219 Gastro-esophageal reflux disease without esophagitis: Secondary | ICD-10-CM | POA: Diagnosis present

## 2019-04-03 DIAGNOSIS — I7102 Dissection of abdominal aorta: Secondary | ICD-10-CM

## 2019-04-03 DIAGNOSIS — Z419 Encounter for procedure for purposes other than remedying health state, unspecified: Secondary | ICD-10-CM

## 2019-04-03 DIAGNOSIS — Z781 Physical restraint status: Secondary | ICD-10-CM

## 2019-04-03 DIAGNOSIS — I7100 Dissection of unspecified site of aorta: Secondary | ICD-10-CM

## 2019-04-03 DIAGNOSIS — Z20828 Contact with and (suspected) exposure to other viral communicable diseases: Secondary | ICD-10-CM | POA: Diagnosis present

## 2019-04-03 DIAGNOSIS — R7303 Prediabetes: Secondary | ICD-10-CM | POA: Diagnosis present

## 2019-04-03 DIAGNOSIS — M1990 Unspecified osteoarthritis, unspecified site: Secondary | ICD-10-CM | POA: Diagnosis present

## 2019-04-03 DIAGNOSIS — I7101 Dissection of thoracic aorta: Secondary | ICD-10-CM | POA: Diagnosis present

## 2019-04-03 DIAGNOSIS — I313 Pericardial effusion (noninflammatory): Secondary | ICD-10-CM | POA: Diagnosis present

## 2019-04-03 DIAGNOSIS — Z8249 Family history of ischemic heart disease and other diseases of the circulatory system: Secondary | ICD-10-CM

## 2019-04-03 DIAGNOSIS — I97110 Postprocedural cardiac insufficiency following cardiac surgery: Secondary | ICD-10-CM

## 2019-04-03 DIAGNOSIS — Z800 Family history of malignant neoplasm of digestive organs: Secondary | ICD-10-CM

## 2019-04-03 DIAGNOSIS — M779 Enthesopathy, unspecified: Secondary | ICD-10-CM | POA: Diagnosis present

## 2019-04-03 DIAGNOSIS — I4892 Unspecified atrial flutter: Secondary | ICD-10-CM | POA: Diagnosis present

## 2019-04-03 DIAGNOSIS — E861 Hypovolemia: Secondary | ICD-10-CM | POA: Diagnosis present

## 2019-04-03 DIAGNOSIS — N170 Acute kidney failure with tubular necrosis: Secondary | ICD-10-CM | POA: Diagnosis present

## 2019-04-03 DIAGNOSIS — Z923 Personal history of irradiation: Secondary | ICD-10-CM

## 2019-04-03 DIAGNOSIS — Z888 Allergy status to other drugs, medicaments and biological substances status: Secondary | ICD-10-CM

## 2019-04-03 DIAGNOSIS — R451 Restlessness and agitation: Secondary | ICD-10-CM | POA: Diagnosis not present

## 2019-04-03 DIAGNOSIS — Z992 Dependence on renal dialysis: Secondary | ICD-10-CM

## 2019-04-03 DIAGNOSIS — Z853 Personal history of malignant neoplasm of breast: Secondary | ICD-10-CM | POA: Diagnosis not present

## 2019-04-03 DIAGNOSIS — I472 Ventricular tachycardia: Secondary | ICD-10-CM | POA: Diagnosis not present

## 2019-04-03 DIAGNOSIS — M48061 Spinal stenosis, lumbar region without neurogenic claudication: Secondary | ICD-10-CM | POA: Diagnosis present

## 2019-04-03 DIAGNOSIS — D62 Acute posthemorrhagic anemia: Secondary | ICD-10-CM | POA: Diagnosis not present

## 2019-04-03 DIAGNOSIS — Z8679 Personal history of other diseases of the circulatory system: Secondary | ICD-10-CM

## 2019-04-03 DIAGNOSIS — K5909 Other constipation: Secondary | ICD-10-CM | POA: Diagnosis not present

## 2019-04-03 DIAGNOSIS — B1920 Unspecified viral hepatitis C without hepatic coma: Secondary | ICD-10-CM | POA: Diagnosis present

## 2019-04-03 DIAGNOSIS — Z9889 Other specified postprocedural states: Secondary | ICD-10-CM

## 2019-04-03 DIAGNOSIS — Z833 Family history of diabetes mellitus: Secondary | ICD-10-CM

## 2019-04-03 DIAGNOSIS — Z981 Arthrodesis status: Secondary | ICD-10-CM

## 2019-04-03 DIAGNOSIS — J9601 Acute respiratory failure with hypoxia: Secondary | ICD-10-CM | POA: Diagnosis not present

## 2019-04-03 DIAGNOSIS — J9811 Atelectasis: Secondary | ICD-10-CM | POA: Diagnosis present

## 2019-04-03 DIAGNOSIS — F05 Delirium due to known physiological condition: Secondary | ICD-10-CM | POA: Diagnosis not present

## 2019-04-03 DIAGNOSIS — I71019 Dissection of thoracic aorta, unspecified: Secondary | ICD-10-CM

## 2019-04-03 DIAGNOSIS — E872 Acidosis: Secondary | ICD-10-CM | POA: Diagnosis not present

## 2019-04-03 DIAGNOSIS — Z452 Encounter for adjustment and management of vascular access device: Secondary | ICD-10-CM

## 2019-04-03 DIAGNOSIS — I1311 Hypertensive heart and chronic kidney disease without heart failure, with stage 5 chronic kidney disease, or end stage renal disease: Secondary | ICD-10-CM | POA: Diagnosis present

## 2019-04-03 DIAGNOSIS — Z820 Family history of epilepsy and other diseases of the nervous system: Secondary | ICD-10-CM

## 2019-04-03 DIAGNOSIS — Z885 Allergy status to narcotic agent status: Secondary | ICD-10-CM

## 2019-04-03 DIAGNOSIS — E875 Hyperkalemia: Secondary | ICD-10-CM | POA: Diagnosis present

## 2019-04-03 DIAGNOSIS — N390 Urinary tract infection, site not specified: Secondary | ICD-10-CM | POA: Diagnosis not present

## 2019-04-03 DIAGNOSIS — N184 Chronic kidney disease, stage 4 (severe): Secondary | ICD-10-CM | POA: Diagnosis not present

## 2019-04-03 DIAGNOSIS — Z79899 Other long term (current) drug therapy: Secondary | ICD-10-CM

## 2019-04-03 DIAGNOSIS — Z886 Allergy status to analgesic agent status: Secondary | ICD-10-CM

## 2019-04-03 DIAGNOSIS — Z952 Presence of prosthetic heart valve: Secondary | ICD-10-CM

## 2019-04-03 DIAGNOSIS — N179 Acute kidney failure, unspecified: Secondary | ICD-10-CM

## 2019-04-03 DIAGNOSIS — I959 Hypotension, unspecified: Secondary | ICD-10-CM | POA: Diagnosis present

## 2019-04-03 DIAGNOSIS — F17210 Nicotine dependence, cigarettes, uncomplicated: Secondary | ICD-10-CM | POA: Diagnosis present

## 2019-04-03 DIAGNOSIS — R918 Other nonspecific abnormal finding of lung field: Secondary | ICD-10-CM

## 2019-04-03 DIAGNOSIS — I714 Abdominal aortic aneurysm, without rupture: Secondary | ICD-10-CM | POA: Diagnosis not present

## 2019-04-03 HISTORY — DX: Dissection of unspecified site of aorta: I71.00

## 2019-04-03 LAB — COMPREHENSIVE METABOLIC PANEL
ALT: 14 U/L (ref 0–44)
AST: 24 U/L (ref 15–41)
Albumin: 3.3 g/dL — ABNORMAL LOW (ref 3.5–5.0)
Alkaline Phosphatase: 56 U/L (ref 38–126)
Anion gap: 12 (ref 5–15)
BUN: 27 mg/dL — ABNORMAL HIGH (ref 8–23)
CO2: 24 mmol/L (ref 22–32)
Calcium: 8.9 mg/dL (ref 8.9–10.3)
Chloride: 105 mmol/L (ref 98–111)
Creatinine, Ser: 3.36 mg/dL — ABNORMAL HIGH (ref 0.44–1.00)
GFR calc Af Amer: 16 mL/min — ABNORMAL LOW (ref >60)
GFR calc non Af Amer: 14 mL/min — ABNORMAL LOW (ref >60)
Glucose, Bld: 121 mg/dL — ABNORMAL HIGH (ref 70–99)
Potassium: 4.5 mmol/L (ref 3.5–5.1)
Sodium: 141 mmol/L (ref 135–145)
Total Bilirubin: 0.8 mg/dL (ref 0.3–1.2)
Total Protein: 5.9 g/dL — ABNORMAL LOW (ref 6.5–8.1)

## 2019-04-03 LAB — CBC WITH DIFFERENTIAL/PLATELET
Abs Immature Granulocytes: 0.04 10*3/uL (ref 0.00–0.07)
Basophils Absolute: 0 10*3/uL (ref 0.0–0.1)
Basophils Relative: 1 %
Eosinophils Absolute: 0.2 10*3/uL (ref 0.0–0.5)
Eosinophils Relative: 3 %
HCT: 34.2 % — ABNORMAL LOW (ref 36.0–46.0)
Hemoglobin: 10.8 g/dL — ABNORMAL LOW (ref 12.0–15.0)
Immature Granulocytes: 1 %
Lymphocytes Relative: 21 %
Lymphs Abs: 1.7 10*3/uL (ref 0.7–4.0)
MCH: 29.9 pg (ref 26.0–34.0)
MCHC: 31.6 g/dL (ref 30.0–36.0)
MCV: 94.7 fL (ref 80.0–100.0)
Monocytes Absolute: 0.5 10*3/uL (ref 0.1–1.0)
Monocytes Relative: 6 %
Neutro Abs: 5.4 10*3/uL (ref 1.7–7.7)
Neutrophils Relative %: 68 %
Platelets: 305 10*3/uL (ref 150–400)
RBC: 3.61 MIL/uL — ABNORMAL LOW (ref 3.87–5.11)
RDW: 14.2 % (ref 11.5–15.5)
WBC: 7.9 10*3/uL (ref 4.0–10.5)
nRBC: 0 % (ref 0.0–0.2)

## 2019-04-03 LAB — TROPONIN I (HIGH SENSITIVITY)
Troponin I (High Sensitivity): 27 ng/L — ABNORMAL HIGH (ref <18)
Troponin I (High Sensitivity): 35 ng/L — ABNORMAL HIGH (ref <18)

## 2019-04-03 LAB — LIPASE, BLOOD: Lipase: 30 U/L (ref 11–51)

## 2019-04-03 LAB — PROTIME-INR
INR: 1 (ref 0.8–1.2)
Prothrombin Time: 12.7 seconds (ref 11.4–15.2)

## 2019-04-03 MED ORDER — LABETALOL HCL 5 MG/ML IV SOLN
10.0000 mg | Freq: Once | INTRAVENOUS | Status: AC
  Administered 2019-04-03: 10 mg via INTRAVENOUS
  Filled 2019-04-03: qty 4

## 2019-04-03 MED ORDER — LABETALOL HCL 5 MG/ML IV SOLN
0.5000 mg/min | INTRAVENOUS | Status: DC
  Administered 2019-04-03: 0.5 mg/min via INTRAVENOUS
  Administered 2019-04-04: 07:00:00 via INTRAVENOUS
  Administered 2019-04-04: 3 mg/min via INTRAVENOUS
  Administered 2019-04-08: 12:00:00 0.5 mg/min via INTRAVENOUS
  Administered 2019-04-08: 19:00:00 1 mg/min via INTRAVENOUS
  Administered 2019-04-09 – 2019-04-11 (×3): 0.5 mg/min via INTRAVENOUS
  Filled 2019-04-03: qty 60
  Filled 2019-04-03 (×5): qty 100
  Filled 2019-04-03 (×2): qty 20
  Filled 2019-04-03: qty 100

## 2019-04-03 MED ORDER — NITROGLYCERIN IN D5W 200-5 MCG/ML-% IV SOLN
0.0000 ug/min | INTRAVENOUS | Status: DC
  Administered 2019-04-03: 5 ug/min via INTRAVENOUS
  Filled 2019-04-03: qty 250

## 2019-04-03 MED ORDER — SODIUM CHLORIDE 0.9% FLUSH: 3.0000 mL | Freq: Once | INTRAVENOUS | Status: DC

## 2019-04-03 MED ORDER — ONDANSETRON HCL 4 MG/2ML IJ SOLN
4.0000 mg | Freq: Once | INTRAMUSCULAR | Status: AC
  Administered 2019-04-03: 4 mg via INTRAVENOUS
  Filled 2019-04-03: qty 2

## 2019-04-03 MED ORDER — MORPHINE SULFATE (PF) 4 MG/ML IV SOLN
4.0000 mg | Freq: Once | INTRAVENOUS | Status: AC
  Administered 2019-04-03: 18:00:00 4 mg via INTRAVENOUS
  Filled 2019-04-03: qty 1

## 2019-04-03 MED ORDER — HYDROMORPHONE HCL 1 MG/ML IJ SOLN
1.0000 mg | Freq: Once | INTRAMUSCULAR | Status: AC
  Administered 2019-04-03: 20:00:00 1 mg via INTRAVENOUS
  Filled 2019-04-03: qty 1

## 2019-04-03 NOTE — ED Triage Notes (Signed)
Pt here with [facility] with central left cp. Pt appears restless. Pain came on all of a sudden. Hx of htn and anxiety. 1 nitro and 324 of aspirin. Ems gave 253ml. Pt is bradycardic. Poss of 1st degree heart block

## 2019-04-03 NOTE — ED Provider Notes (Signed)
[facility] EMERGENCY DEPARTMENT Provider Note   CSN: [id number] Arrival date & time: 04/03/19  1802     History   Chief Complaint Chief Complaint  Patient presents with   Chest Pain    HPI Monique Henderson is a 68 y.o. female hx of previous breast cancer, chronic kidney disease, here with chest pain. Patient has acute onset of left sided chest pain started earlier today. States that there is no position that is comfortable. States that it is sharp and radiates to the left upper abdomen but denies any radiation to the back .  Denies any arm numbness or weakness.  Denies any fevers or chills or vomiting.  Patient states that she has a previous history of breast cancer and apparently is in remission. Patient denies any heart problems or history of blood clots. Of note, she does have stage 3 CKD but is not on dialysis.      The history is provided by the patient.    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 Autumn Messing; S/P re-excision of superior and inferior margins 10/27/2012.    GERD (gastroesophageal reflux disease)    Hepatitis C    treated and RNA confirmed not detectable 01/2017   Hot flashes    Hx of radiation therapy 2005   left breast   Hx of radiation therapy 01/11/13- 03/22/13   right breast 5760 cGy 30 sessions   Hypertension    Low back pain    Lumbar spinal stenosis    S/P lumbar decompressive laminectomy, fusion and plating for lumbar  spinal stensosis   Normocytic anemia    With thrombocytosis   Osteoarthritis    Personal history of radiation therapy    Right ureteral stone 2002   Shortness of breath    from pain   Uterine fibroid    Wears dentures    top    Patient Active Problem List   Diagnosis Date Noted   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   Non compliance with medical treatment 12/04/2017   Chronic kidney disease (CKD) stage G3b/A2, moderately decreased glomerular filtration rate (GFR) between 30-44 mL/min/1.73 square meter and albuminuria creatinine ratio between 30-299 mg/g 07/19/2017   Chronic obstructive lung disease ([place]) 01/16/2017   Chronic low back pain 06/22/2016   Insomnia 03/14/2015   S/P lumbar spinal fusion 01/18/2014   Tobacco use disorder 04/19/2009   Essential hypertension 09/16/2006   GERD 09/16/2006    Past Surgical History:  Procedure Laterality Date   ANTERIOR LAT LUMBAR FUSION N/A 01/18/2014   Procedure: ANTERIOR LATERAL LUMBAR FUSION LUMBAR TWO-THREE;  Surgeon: Eustace Moore, MD;  Location: [place] [facility];  Service: Neurosurgery;  Laterality: N/A;   ANTERIOR LUMBAR FUSION  01/18/2014   BACK SURGERY     BREAST LUMPECTOMY Left 01/2003   BREAST LUMPECTOMY Right 2014   BREAST LUMPECTOMY WITH NEEDLE LOCALIZATION AND AXILLARY SENTINEL LYMPH NODE BX Right 10/13/2012   Procedure: BREAST LUMPECTOMY WITH NEEDLE LOCALIZATION;  Surgeon: [name], MD;  Location: [place];  Service: General;  Laterality: Right;  Right breast wire localized lumpectomy   LAMINECTOMY  05/27/2009   Lumbar decompressive laminectomy, fusion and plating for lumbar spinal stensosis   LUMBAR LAMINECTOMY/DECOMPRESSION MICRODISCECTOMY Left 03/23/2013   Procedure: LUMBAR LAMINECTOMY/DECOMPRESSION MICRODISCECTOMY 1 LEVEL;  Surgeon: Eustace Moore, MD;  Location: [place] [facility];  Service: Neurosurgery;  Laterality: Left;  LUMBAR LAMINECTOMY/DECOMPRESSION MICRODISCECTOMY 1 LEVEL    MASTECTOMY, PARTIAL Left 02/26/2003   ; S/P re-excision of cranial and lateral margins 04/19/2003.    RE-EXCISION OF BREAST CANCER,SUPERIOR MARGINS Right 10/27/2012   Procedure: RE-EXCISION OF BREAST CANCER,SUPERIOR and inferior MARGINS;  Surgeon: [name], MD;  Location: [place];  Service: General;  Laterality: Right;   RE-EXCISION OF BREAST LUMPECTOMY Left 04/2003     OB History    Gravida  2   Para  2   Term  1   Preterm  1   AB      Living        SAB      TAB      Ectopic      Multiple      Live Births               Home Medications    Prior to Admission medications   Medication Sig Start Date End Date Taking? Authorizing Provider  albuterol (VENTOLIN HFA) 108 (90 Base) MCG/ACT inhaler Inhale 2 puffs into the lungs every 6 (six) hours as needed for wheezing or shortness of breath. 02/02/19  Yes [name], MD  amLODipine (NORVASC) 10 MG tablet TAKE 1 TABLET (10 MG TOTAL) BY MOUTH DAILY. 09/20/18  Yes [name], MD  carvedilol (COREG) 12.5 MG tablet TAKE 1 TABLET (12.5 MG TOTAL) BY MOUTH 2 (TWO) TIMES DAILY WITH A MEAL. 03/22/18  Yes [name], MD  cloNIDine (CATAPRES) 0.2 MG tablet Take 1 tablet (0.2 mg total) by mouth 2 (two) times daily. 05/04/18  Yes [name], MD  famotidine (PEPCID) 10 MG tablet Take 1 tablet (10 mg total) by mouth at bedtime. 05/04/18  Yes [name], MD  pregabalin (LYRICA) 75 MG capsule TAKE 1 CAPSULE (75 MG TOTAL) BY MOUTH 2 (TWO) TIMES DAILY. 09/15/18  Yes Forrest Moron, MD  oxyCODONE-acetaminophen (PERCOCET) 5-325 MG tablet Take 1 tablet by mouth every 6 (six) hours as needed for severe pain. Patient not taking: Reported on 04/03/2019 09/29/17   Edrick Kins, DPM    Family History Family History  Problem Relation Age of Onset   Colon cancer Mother 22   Hypertension Mother    Diabetes Sister 68   Hypertension Sister    Diabetes Brother    Hypertension Brother    Diabetes Brother    Hypertension  Brother    Kidney disease Son        On dialysis   Hypertension Son    Diabetes Son    Multiple sclerosis Son    Bone cancer Sister 44   Breast cancer Neg Hx    Cervical cancer Neg Hx     Social History Social History   Tobacco Use   Smoking status: Current Some Day Smoker    Packs/day: 0.25    Years: 44.00    Pack years: 11.00    Types: Cigarettes   Smokeless tobacco: Never Used   Tobacco comment: smoking less  Substance Use Topics   Alcohol use:  Yes    Alcohol/week: 2.0 standard drinks    Types: 2 Cans of beer per week    Comment:  "couple beers q weekend"   Drug use: Yes    Types: Cocaine    Comment: "stopped back in the 1980's"     Allergies   Shrimp [shellfish allergy], Bactroban [mupirocin], Vicodin [hydrocodone-acetaminophen], Lisinopril, and Tylenol [acetaminophen]   Review of Systems Review of Systems  Cardiovascular: Positive for chest pain.  All other systems reviewed and are negative.    Physical Exam Updated Vital Signs BP (!) 150/104    Pulse (!) 58    Temp 98.6 F (37 C) (Oral)    Resp (!) 22    SpO2 95%   Physical Exam Vitals signs reviewed.  Constitutional:      Comments: Uncomfortable, writhing in pain   HENT:     Head: Normocephalic.  Neck:     Musculoskeletal: Normal range of motion.  Cardiovascular:     Rate and Rhythm: Normal rate and regular rhythm.     Heart sounds: Normal heart sounds.  Pulmonary:     Effort: Pulmonary effort is normal.     Breath sounds: Normal breath sounds.  Abdominal:     General: Bowel sounds are normal.     Palpations: Abdomen is soft.     Comments: Mild LUQ tenderness   Musculoskeletal: Normal range of motion.     Comments: Good peripheral pulses bilateral upper and lower extremities   Skin:    General: Skin is warm.     Capillary Refill: Capillary refill takes less than 2 seconds.  Neurological:     General: No focal deficit present.     Mental Status: She is alert and oriented to  person, place, and time.  Psychiatric:        Mood and Affect: Mood normal.        Behavior: Behavior normal.      ED Treatments / Results  Labs (all labs ordered are listed, but only abnormal results are displayed) Labs Reviewed  CBC WITH DIFFERENTIAL/PLATELET - Abnormal; Notable for the following components:      Result Value   RBC 3.61 (*)    Hemoglobin 10.8 (*)    HCT 34.2 (*)    All other components within normal limits  COMPREHENSIVE METABOLIC PANEL - Abnormal; Notable for the following components:   Glucose, Bld 121 (*)    BUN 27 (*)    Creatinine, Ser 3.36 (*)    Total Protein 5.9 (*)    Albumin 3.3 (*)    GFR calc non Af Amer 14 (*)    GFR calc Af Amer 16 (*)    All other components within normal limits  TROPONIN I (HIGH SENSITIVITY) - Abnormal; Notable for the following components:   Troponin I (High Sensitivity) 27 (*)    All other components within normal limits  TROPONIN I (HIGH SENSITIVITY) - Abnormal; Notable for the following components:   Troponin I (High Sensitivity) 35 (*)    All other components within normal limits  SARS CORONAVIRUS 2 BY RT PCR (HOSPITAL ORDER, [place] [facility])  PROTIME-INR  LIPASE, BLOOD  URINALYSIS, ROUTINE W REFLEX MICROSCOPIC    EKG EKG Interpretation  Date/Time:  Monday April 03 2019 18:21:29 EST Ventricular Rate:  50 PR Interval:    QRS Duration: 73 QT Interval:  448 QTC Calculation: 409 R Axis:   46 Text Interpretation: Sinus rhythm Prolonged PR interval Probable left atrial enlargement  Anteroseptal infarct, age indeterminate Lateral leads are also involved No significant change since last tracing Confirmed by Wandra Arthurs [phone]) on 04/03/2019 6:34:33 PM   Radiology Dg Chest 2 View  Result Date: 04/03/2019 CLINICAL DATA:  Mid chest pain and shortness of breath, sudden onset, fall. EXAM: CHEST - 2 VIEW COMPARISON:  12/22/2017 FINDINGS: Mild enlargement of the cardiopericardial silhouette,  without edema. Moderate thoracic spondylosis. The lungs appear clear. No blunting of the costophrenic angles. Questionable irregularity of the right tenth rib laterally, correlate with any point tenderness. IMPRESSION: 1. Questionable irregularity of the right tenth rib laterally, correlate with any point tenderness. 2. Mild enlargement of the cardiopericardial silhouette, without edema. 3. Moderate thoracic spondylosis. Electronically Signed   By: Van Clines M.D.   On: 04/03/2019 19:11   Ct Chest Wo Contrast  Result Date: 04/03/2019 CLINICAL DATA:  Flank pain.  Bradycardia. EXAM: CT CHEST, ABDOMEN AND PELVIS WITHOUT CONTRAST TECHNIQUE: Multidetector CT imaging of the chest, abdomen and pelvis was performed following the standard protocol without IV contrast. COMPARISON:  None. FINDINGS: CT CHEST FINDINGS Cardiovascular: There is a crescentic aortic wall hyperdensity from the aortic root extending through the descending thoracic aorta. Ascending aorta measures approximately 3.7 cm in diameter. Aortic calcifications are noted. The heart size is normal. There is a small relatively low density pericardial effusion. There are likely some small volume blood products within the mediastinum as evidence by fat stranding in the upper paratracheal region. Mediastinum/Nodes: --No mediastinal or hilar lymphadenopathy. --No axillary lymphadenopathy. --No supraclavicular lymphadenopathy. --Normal thyroid gland. --The esophagus is unremarkable Lungs/Pleura: There is no pneumothorax. There is scarring versus atelectasis at the lung bases bilaterally, right worse than left. There is no significant pleural effusion. The trachea is unremarkable. Musculoskeletal: No chest wall abnormality. No acute or significant osseous findings. CT ABDOMEN PELVIS FINDINGS Hepatobiliary: The liver is normal. Normal gallbladder.There is no biliary ductal dilation. Pancreas: Normal contours without ductal dilatation. No peripancreatic fluid  collection. Spleen: No splenic laceration or hematoma. Adrenals/Urinary Tract: --Adrenal glands: No adrenal hemorrhage. --Right kidney/ureter: There is a hyperdense nodule right arising from the upper pole measuring approximately 1.1 cm. There is no hydronephrosis. --Left kidney/ureter: Multiple tiny hyperdense nodules are noted in the upper pole. There is a punctate nonobstructing stone in the lower pole. --Urinary bladder: Unremarkable. Stomach/Bowel: --Stomach/Duodenum: No hiatal hernia or other gastric abnormality. Normal duodenal course and caliber. --Small bowel: No dilatation or inflammation. --Colon: No focal abnormality. --Appendix: Normal. Vascular/Lymphatic: Atherosclerotic calcification is present within the non-aneurysmal abdominal aorta, without hemodynamically significant stenosis. --No retroperitoneal lymphadenopathy. --No mesenteric lymphadenopathy. --No pelvic or inguinal lymphadenopathy. Reproductive: Multiple fibroids are suspected within the uterus. Other: No ascites or free air. The abdominal wall is normal. Musculoskeletal. No acute displaced fractures. IMPRESSION: 1. Evaluation limited by lack of IV contrast. 2. 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 Follow-up with a CTA is recommended. Surgical consultation is recommended. 3. Small relatively hypodense pericardial effusion. 4. No acute intra-abdominal abnormality. 5. Indeterminate hyperdense bilateral renal nodules. Follow-up with a nonemergent outpatient renal ultrasound is recommended for further evaluation. 6. Fibroid uterus suspected. These results were called by telephone at the time of interpretation on 04/03/2019 at 10:33 pm to provider Mantaj Chamberlin , who verbally acknowledged these results. Electronically Signed   By: Constance Holster M.D.   On: 04/03/2019 22:37   Ct Renal Stone Study  Result Date: 04/03/2019 CLINICAL DATA:  Flank  pain.  Bradycardia. EXAM: CT CHEST, ABDOMEN AND PELVIS WITHOUT CONTRAST TECHNIQUE: Multidetector CT imaging of the chest, abdomen and pelvis was performed following the standard protocol without IV contrast. COMPARISON:  None. FINDINGS: CT CHEST FINDINGS Cardiovascular: There is a crescentic aortic wall hyperdensity from the aortic root extending through the descending thoracic aorta. Ascending aorta measures approximately 3.7 cm in diameter. Aortic calcifications are noted. The heart size is normal. There is a small relatively low density pericardial effusion. There are likely some small volume blood products within the mediastinum as evidence by fat stranding in the upper paratracheal region. Mediastinum/Nodes: --No mediastinal or hilar lymphadenopathy. --No axillary lymphadenopathy. --No supraclavicular lymphadenopathy. --Normal thyroid gland. --The esophagus is unremarkable Lungs/Pleura: There is no pneumothorax. There is scarring versus atelectasis at the lung bases bilaterally, right worse than left. There is no significant pleural effusion. The trachea is unremarkable. Musculoskeletal: No chest wall abnormality. No acute or significant osseous findings. CT ABDOMEN PELVIS FINDINGS Hepatobiliary: The liver is normal. Normal gallbladder.There is no biliary ductal dilation. Pancreas: Normal contours without ductal dilatation. No peripancreatic fluid collection. Spleen: No splenic laceration or hematoma. Adrenals/Urinary Tract: --Adrenal glands: No adrenal hemorrhage. --Right kidney/ureter: There is a hyperdense nodule right arising from the upper pole measuring approximately 1.1 cm. There is no hydronephrosis. --Left kidney/ureter: Multiple tiny hyperdense nodules are noted in the upper pole. There is a punctate nonobstructing stone in the lower pole. --Urinary bladder: Unremarkable. Stomach/Bowel: --Stomach/Duodenum: No hiatal hernia or other gastric abnormality. Normal duodenal course and caliber. --Small  bowel: No dilatation or inflammation. --Colon: No focal abnormality. --Appendix: Normal. Vascular/Lymphatic: Atherosclerotic calcification is present within the non-aneurysmal abdominal aorta, without hemodynamically significant stenosis. --No retroperitoneal lymphadenopathy. --No mesenteric lymphadenopathy. --No pelvic or inguinal lymphadenopathy. Reproductive: Multiple fibroids are suspected within the uterus. Other: No ascites or free air. The abdominal wall is normal. Musculoskeletal. No acute displaced fractures. IMPRESSION: 1. Evaluation limited by lack of IV contrast. 2. 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 Follow-up with a CTA is recommended. Surgical consultation is recommended. 3. Small relatively hypodense pericardial effusion. 4. No acute intra-abdominal abnormality. 5. Indeterminate hyperdense bilateral renal nodules. Follow-up with a nonemergent outpatient renal ultrasound is recommended for further evaluation. 6. Fibroid uterus suspected. These results were called by telephone at the time of interpretation on 04/03/2019 at 10:33 pm to provider Harles Evetts , who verbally acknowledged these results. Electronically Signed   By: Constance Holster M.D.   On: 04/03/2019 22:37    Procedures Procedures (including critical care time)  CRITICAL CARE Performed by: Wandra Arthurs   Total critical care time: 45 minutes  Critical care time was exclusive of separately billable procedures and treating other patients.  Critical care was necessary to treat or prevent imminent or life-threatening deterioration.  Critical care was time spent personally by me on the following activities: development of treatment plan with patient and/or surrogate as well as nursing, discussions with consultants, evaluation of patient's response to treatment, examination of patient, obtaining history from patient or surrogate,  ordering and performing treatments and interventions, ordering and review of laboratory studies, ordering and review of radiographic studies, pulse oximetry and re-evaluation of patient's condition.   Medications Ordered in ED Medications  sodium chloride flush (NS) 0.9 % injection 3 mL (has no administration in time range)  nitroGLYCERIN 50 mg in dextrose 5 % 250 mL (0.2 mg/mL) infusion (115 mcg/min Intravenous Rate/Dose Change 04/04/19 0112)  labetalol (NORMODYNE) 500  mg in dextrose 5 % 125 mL (4 mg/mL) infusion (3 mg/min Intravenous Rate/Dose Change 04/04/19 0058)  sodium bicarbonate 50 mEq in dextrose 5 % 1,000 mL infusion ( Intravenous New Bag/Given 04/04/19 0031)  morphine 4 MG/ML injection 4 mg (4 mg Intravenous Given 04/03/19 1829)  ondansetron (ZOFRAN) injection 4 mg (4 mg Intravenous Given 04/03/19 1829)  HYDROmorphone (DILAUDID) injection 1 mg (1 mg Intravenous Given 04/03/19 1930)  labetalol (NORMODYNE) injection 10 mg (10 mg Intravenous Given 04/03/19 2248)     Initial Impression / Assessment and Plan / ED Course  I have reviewed the triage vital signs and the nursing notes.  Pertinent labs & imaging results that were available during my care of the patient were reviewed by me and considered in my medical decision making (see chart for details).       Monique Henderson is a 68 y.o. female here with L chest pain, LUQ pain.  She appears very comfortable and slightly hypertensive so I am concerned for possible dissection.  Want to order a dissection study but her baseline creatinine is 2.2.  Today her creatinine is up to 3.3 so we will get CT chest abdomen pelvis. Also consider pancreatitis vs biliary colic vs renal colic.   10:50 pm CT noncontrast showed type A dissection. There is pericardial effusion. BP 140s with nitro drip and labetalol. Still has pain. Consulted Dr. Orvan Seen from [facility]. He will see patient. He states to hold off on CTA for now.   11:30 PM Dr. Orvan Seen saw  patient and will admit. Will start labetalol drip. He will admit to ICU under his service and will likely do surgery in the morning.   Final Clinical Impressions(s) / ED Diagnoses   Final diagnoses:  Dissection of thoracic aorta [facility])    ED Discharge Orders    None       Drenda Freeze, MD 04/04/19 [phone]

## 2019-04-03 NOTE — Consult Note (Signed)
[address]       [place] [address]             [phone]        Ceyda F Manganaro [facility] Medical Record #[id number] Date of Birth: May 10, 1951  Referring: No ref. provider found Primary Care: [name], MD Primary Cardiologist:No primary care provider on file.  Chief Complaint:    Chief Complaint  Patient presents with   Chest Pain    History of Present Illness:     68 yo lady with h/o CKD, HTN, and breast CA (in remission) presents with several hour h/o chest pain, which she had never experienced previously. This began at approximately 1700 hr on 04/03/19 while cooking dinner and the pain was sudden and sharp. When there was no relief, she called EMS. 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 Now, hemodynamically stable, some residual pain, not as severe, but it has not completely abated.   Current Activity/ Functional Status: Patient will be independent with mobility/ambulation, transfers, ADL's, IADL's.   Zubrod Score: At the time of surgery this patients most appropriate activity status/level should be described as: []     0    Normal activity, no symptoms []     1    Restricted in physical strenuous activity but ambulatory, able to do out light work []     2    Ambulatory and capable of self care, unable to do work activities, up and about                 more than 50%  Of the time                            []     3    Only limited self care, in bed greater than 50% of waking hours []     4    Completely disabled, no self care, confined to bed or chair []     5    Moribund  Past Medical History:  Diagnosis Date   Atelectasis 2002   Bilateral   Bone spur 2008   Right calcaneal foot spur   Breast cancer ([place]) 2004   Ductal carcinoma in situ of the left breast; S/P left partial mastectomy 02/26/2003; S/P re-excision of cranial and lateral margins11/18/2004.radiation   Breast cancer ([place]) 09/21/2012   right breast/  last radiation treatment 03/22/2013   Chronic kidney disease, stage IV (severe) ([place]) 10/10/2007   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 Autumn Messing; S/P re-excision of superior and inferior margins 10/27/2012.    GERD (gastroesophageal reflux disease)    Hepatitis C    treated and RNA confirmed not detectable 01/2017   Hot flashes    Hx of radiation therapy 2005   left breast   Hx of radiation therapy 01/11/13- 03/22/13   right breast 5760 cGy 30 sessions   Hypertension    Low back pain    Lumbar spinal stenosis    S/P lumbar decompressive laminectomy, fusion and plating for lumbar spinal stensosis   Normocytic anemia    With thrombocytosis   Osteoarthritis    Personal history of radiation therapy    Right ureteral stone 2002   Shortness of breath    from pain   Uterine fibroid    Wears dentures    top  Past Surgical History:  Procedure Laterality Date   ANTERIOR LAT LUMBAR FUSION N/A 01/18/2014   Procedure: ANTERIOR LATERAL LUMBAR FUSION LUMBAR TWO-THREE;  Surgeon: Eustace Moore, MD;  Location: [facility];  Service: Neurosurgery;  Laterality: N/A;   ANTERIOR LUMBAR FUSION  01/18/2014   BACK SURGERY     BREAST LUMPECTOMY Left 01/2003   BREAST LUMPECTOMY Right 2014   BREAST LUMPECTOMY WITH NEEDLE LOCALIZATION AND AXILLARY SENTINEL LYMPH NODE BX Right 10/13/2012   Procedure: BREAST LUMPECTOMY WITH NEEDLE LOCALIZATION;  Surgeon: [name], MD;  Location: [facility];  Service: General;  Laterality: Right;  Right breast wire localized lumpectomy   LAMINECTOMY  05/27/2009   Lumbar decompressive laminectomy, fusion and plating for lumbar spinal stensosis   LUMBAR LAMINECTOMY/DECOMPRESSION MICRODISCECTOMY Left 03/23/2013   Procedure: LUMBAR LAMINECTOMY/DECOMPRESSION MICRODISCECTOMY 1 LEVEL;  Surgeon: Eustace Moore, MD;  Location: [facility];  Service: Neurosurgery;  Laterality:  Left;  LUMBAR LAMINECTOMY/DECOMPRESSION MICRODISCECTOMY 1 LEVEL   MASTECTOMY, PARTIAL Left 02/26/2003   ; S/P re-excision of cranial and lateral margins 04/19/2003.    RE-EXCISION OF BREAST CANCER,SUPERIOR MARGINS Right 10/27/2012   Procedure: RE-EXCISION OF BREAST CANCER,SUPERIOR and inferior MARGINS;  Surgeon: Luella Cook III, MD;  Location: [facility];  Service: General;  Laterality: Right;   RE-EXCISION OF BREAST LUMPECTOMY Left 04/2003    Social History   Tobacco Use  Smoking Status Current Some Day Smoker   Packs/day: 0.25   Years: 44.00   Pack years: 11.00   Types: Cigarettes  Smokeless Tobacco Never Used  Tobacco Comment   smoking less    Social History   Substance and Sexual Activity  Alcohol Use Yes   Alcohol/week: 2.0 standard drinks   Types: 2 Cans of beer per week   Comment:  "couple beers q weekend"     Allergies  Allergen Reactions   Shrimp [Shellfish Allergy] Shortness Of Breath   Bactroban [Mupirocin] Other (See Comments)    "Sores in nose"   Vicodin [Hydrocodone-Acetaminophen] Itching and Nausea And Vomiting    This is patient's home medication   Lisinopril Cough   Tylenol [Acetaminophen] Itching    Current Facility-Administered Medications  Medication Dose Route Frequency Provider Last Rate Last Dose   labetalol (NORMODYNE) 500 mg in dextrose 5 % 125 mL (4 mg/mL) infusion  0.5-3 mg/min Intravenous Titrated Drenda Freeze, MD       nitroGLYCERIN 50 mg in dextrose 5 % 250 mL (0.2 mg/mL) infusion  0-200 mcg/min Intravenous Continuous Drenda Freeze, MD 15 mL/hr at 04/03/19 2302 50 mcg/min at 04/03/19 2302   sodium chloride flush (NS) 0.9 % injection 3 mL  3 mL Intravenous Once Drenda Freeze, MD       Current Outpatient Medications  Medication Sig Dispense Refill   albuterol (VENTOLIN HFA) 108 (90 Base) MCG/ACT inhaler Inhale 2 puffs into the lungs every 6 (six) hours as needed for wheezing or shortness of breath. 8 g 5    amLODipine (NORVASC) 10 MG tablet TAKE 1 TABLET (10 MG TOTAL) BY MOUTH DAILY. 90 tablet 0   carvedilol (COREG) 12.5 MG tablet TAKE 1 TABLET (12.5 MG TOTAL) BY MOUTH 2 (TWO) TIMES DAILY WITH A MEAL. 180 tablet 1   cloNIDine (CATAPRES) 0.2 MG tablet Take 1 tablet (0.2 mg total) by mouth 2 (two) times daily. 180 tablet 1   famotidine (PEPCID) 10 MG tablet Take 1 tablet (10 mg total) by mouth at bedtime. 90 tablet 1  pregabalin (LYRICA) 75 MG capsule TAKE 1 CAPSULE (75 MG TOTAL) BY MOUTH 2 (TWO) TIMES DAILY. 60 capsule 6   oxyCODONE-acetaminophen (PERCOCET) 5-325 MG tablet Take 1 tablet by mouth every 6 (six) hours as needed for severe pain. (Patient not taking: Reported on 04/03/2019) 30 tablet 0    (Not in a hospital admission)   Family History  Problem Relation Age of Onset   Colon cancer Mother 23   Hypertension Mother    Diabetes Sister 19   Hypertension Sister    Diabetes Brother    Hypertension Brother    Diabetes Brother    Hypertension Brother    Kidney disease Son        On dialysis   Hypertension Son    Diabetes Son    Multiple sclerosis Son    Bone cancer Sister 14   Breast cancer Neg Hx    Cervical cancer Neg Hx      Review of Systems:   ROS A comprehensive review of systems was negative.     Cardiac Review of Systems: Y or  [    ]= no  Chest Pain [    ]  Resting SOB [   ] Exertional SOB  [  ]  Orthopnea [  ]   Pedal Edema [   ]    Palpitations [  ] Syncope  [  ]   Presyncope [   ]  General Review of Systems: [Y] = yes [  ]=no Constitional: recent weight change [  ]; anorexia [  ]; fatigue [  ]; nausea [  ]; night sweats [  ]; fever [  ]; or chills [  ]                                                               Dental: Last Dentist visit:   Eye : blurred vision [  ]; diplopia [   ]; vision changes [  ];  Amaurosis fugax[  ]; Resp: cough [  ];  wheezing[  ];  hemoptysis[  ]; shortness of breath[  ]; paroxysmal nocturnal dyspnea[  ]; dyspnea  on exertion[  ]; or orthopnea[  ];  GI:  gallstones[  ], vomiting[  ];  dysphagia[  ]; melena[  ];  hematochezia [  ]; heartburn[  ];   Hx of  Colonoscopy[  ]; GU: kidney stones [  ]; hematuria[  ];   dysuria [  ];  nocturia[  ];  history of     obstruction [  ]; urinary frequency [  ]             Skin: rash, swelling[  ];, hair loss[  ];  peripheral edema[  ];  or itching[  ]; Musculosketetal: myalgias[  ];  joint swelling[  ];  joint erythema[  ];  joint pain[  ];  back pain[  ];  Heme/Lymph: bruising[  ];  bleeding[  ];  anemia[  ];  Neuro: TIA[  ];  headaches[  ];  stroke[  ];  vertigo[  ];  seizures[  ];   paresthesias[  ];  difficulty walking[  ];  Psych:depression[  ]; anxiety[  ];  Endocrine: diabetes[  ];  thyroid dysfunction[  ];  Physical Exam: BP (!) 151/103    Pulse 80    Temp 98.6 F (37 C) (Oral)    Resp 19    SpO2 100%    General appearance: alert and cooperative Head: Normocephalic, without obvious abnormality, atraumatic Resp: clear to auscultation bilaterally Back: negative, symmetric, no curvature. ROM normal. No CVA tenderness.  Chest: CTA B CV: RRR, 2/6 murmur at apex Ext: no edema; 2+ pulses in feet  Diagnostic Studies & Laboratory data:     Recent Radiology Findings:   Dg Chest 2 View  Result Date: 04/03/2019 CLINICAL DATA:  Mid chest pain and shortness of breath, sudden onset, fall. EXAM: CHEST - 2 VIEW COMPARISON:  12/22/2017 FINDINGS: Mild enlargement of the cardiopericardial silhouette, without edema. Moderate thoracic spondylosis. The lungs appear clear. No blunting of the costophrenic angles. Questionable irregularity of the right tenth rib laterally, correlate with any point tenderness. IMPRESSION: 1. Questionable irregularity of the right tenth rib laterally, correlate with any point tenderness. 2. Mild enlargement of the cardiopericardial silhouette, without edema. 3. Moderate thoracic spondylosis. Electronically Signed   By: Van Clines  M.D.   On: 04/03/2019 19:11   Ct Chest Wo Contrast  Result Date: 04/03/2019 CLINICAL DATA:  Flank pain.  Bradycardia. EXAM: CT CHEST, ABDOMEN AND PELVIS WITHOUT CONTRAST TECHNIQUE: Multidetector CT imaging of the chest, abdomen and pelvis was performed following the standard protocol without IV contrast. COMPARISON:  None. FINDINGS: CT CHEST FINDINGS Cardiovascular: There is a crescentic aortic wall hyperdensity from the aortic root extending through the descending thoracic aorta. Ascending aorta measures approximately 3.7 cm in diameter. Aortic calcifications are noted. The heart size is normal. There is a small relatively low density pericardial effusion. There are likely some small volume blood products within the mediastinum as evidence by fat stranding in the upper paratracheal region. Mediastinum/Nodes: --No mediastinal or hilar lymphadenopathy. --No axillary lymphadenopathy. --No supraclavicular lymphadenopathy. --Normal thyroid gland. --The esophagus is unremarkable Lungs/Pleura: There is no pneumothorax. There is scarring versus atelectasis at the lung bases bilaterally, right worse than left. There is no significant pleural effusion. The trachea is unremarkable. Musculoskeletal: No chest wall abnormality. No acute or significant osseous findings. CT ABDOMEN PELVIS FINDINGS Hepatobiliary: The liver is normal. Normal gallbladder.There is no biliary ductal dilation. Pancreas: Normal contours without ductal dilatation. No peripancreatic fluid collection. Spleen: No splenic laceration or hematoma. Adrenals/Urinary Tract: --Adrenal glands: No adrenal hemorrhage. --Right kidney/ureter: There is a hyperdense nodule right arising from the upper pole measuring approximately 1.1 cm. There is no hydronephrosis. --Left kidney/ureter: Multiple tiny hyperdense nodules are noted in the upper pole. There is a punctate nonobstructing stone in the lower pole. --Urinary bladder: Unremarkable. Stomach/Bowel:  --Stomach/Duodenum: No hiatal hernia or other gastric abnormality. Normal duodenal course and caliber. --Small bowel: No dilatation or inflammation. --Colon: No focal abnormality. --Appendix: Normal. Vascular/Lymphatic: Atherosclerotic calcification is present within the non-aneurysmal abdominal aorta, without hemodynamically significant stenosis. --No retroperitoneal lymphadenopathy. --No mesenteric lymphadenopathy. --No pelvic or inguinal lymphadenopathy. Reproductive: Multiple fibroids are suspected within the uterus. Other: No ascites or free air. The abdominal wall is normal. Musculoskeletal. No acute displaced fractures. IMPRESSION: 1. Evaluation limited by lack of IV contrast. 2. 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 Follow-up with a CTA is recommended. Surgical consultation is recommended. 3. Small relatively hypodense pericardial effusion. 4. No acute intra-abdominal abnormality. 5. Indeterminate hyperdense bilateral renal nodules. Follow-up with a nonemergent outpatient renal ultrasound is recommended for further  evaluation. 6. Fibroid uterus suspected. These results were called by telephone at the time of interpretation on 04/03/2019 at 10:33 pm to provider DAVID YAO , who verbally acknowledged these results. Electronically Signed   By: Constance Holster M.D.   On: 04/03/2019 22:37   Ct Renal Stone Study  Result Date: 04/03/2019 CLINICAL DATA:  Flank pain.  Bradycardia. EXAM: CT CHEST, ABDOMEN AND PELVIS WITHOUT CONTRAST TECHNIQUE: Multidetector CT imaging of the chest, abdomen and pelvis was performed following the standard protocol without IV contrast. COMPARISON:  None. FINDINGS: CT CHEST FINDINGS Cardiovascular: There is a crescentic aortic wall hyperdensity from the aortic root extending through the descending thoracic aorta. Ascending aorta measures approximately 3.7 cm in diameter. Aortic  calcifications are noted. The heart size is normal. There is a small relatively low density pericardial effusion. There are likely some small volume blood products within the mediastinum as evidence by fat stranding in the upper paratracheal region. Mediastinum/Nodes: --No mediastinal or hilar lymphadenopathy. --No axillary lymphadenopathy. --No supraclavicular lymphadenopathy. --Normal thyroid gland. --The esophagus is unremarkable Lungs/Pleura: There is no pneumothorax. There is scarring versus atelectasis at the lung bases bilaterally, right worse than left. There is no significant pleural effusion. The trachea is unremarkable. Musculoskeletal: No chest wall abnormality. No acute or significant osseous findings. CT ABDOMEN PELVIS FINDINGS Hepatobiliary: The liver is normal. Normal gallbladder.There is no biliary ductal dilation. Pancreas: Normal contours without ductal dilatation. No peripancreatic fluid collection. Spleen: No splenic laceration or hematoma. Adrenals/Urinary Tract: --Adrenal glands: No adrenal hemorrhage. --Right kidney/ureter: There is a hyperdense nodule right arising from the upper pole measuring approximately 1.1 cm. There is no hydronephrosis. --Left kidney/ureter: Multiple tiny hyperdense nodules are noted in the upper pole. There is a punctate nonobstructing stone in the lower pole. --Urinary bladder: Unremarkable. Stomach/Bowel: --Stomach/Duodenum: No hiatal hernia or other gastric abnormality. Normal duodenal course and caliber. --Small bowel: No dilatation or inflammation. --Colon: No focal abnormality. --Appendix: Normal. Vascular/Lymphatic: Atherosclerotic calcification is present within the non-aneurysmal abdominal aorta, without hemodynamically significant stenosis. --No retroperitoneal lymphadenopathy. --No mesenteric lymphadenopathy. --No pelvic or inguinal lymphadenopathy. Reproductive: Multiple fibroids are suspected within the uterus. Other: No ascites or free air. The  abdominal wall is normal. Musculoskeletal. No acute displaced fractures. IMPRESSION: 1. Evaluation limited by lack of IV contrast. 2. 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 Follow-up with a CTA is recommended. Surgical consultation is recommended. 3. Small relatively hypodense pericardial effusion. 4. No acute intra-abdominal abnormality. 5. Indeterminate hyperdense bilateral renal nodules. Follow-up with a nonemergent outpatient renal ultrasound is recommended for further evaluation. 6. Fibroid uterus suspected. These results were called by telephone at the time of interpretation on 04/03/2019 at 10:33 pm to provider DAVID YAO , who verbally acknowledged these results. Electronically Signed   By: Constance Holster M.D.   On: 04/03/2019 22:37     I have independently reviewed the above radiologic studies and discussed with the patient   Recent Lab Findings: Lab Results  Component Value Date   WBC 7.9 04/03/2019   HGB 10.8 (L) 04/03/2019   HCT 34.2 (L) 04/03/2019   PLT 305 04/03/2019   GLUCOSE 121 (H) 04/03/2019   CHOL 156 02/24/2019   TRIG 65 02/24/2019   HDL 54 02/24/2019   LDLCALC 89 02/24/2019   ALT 14 04/03/2019   AST 24 04/03/2019   NA 141 04/03/2019   K 4.5 04/03/2019   CL 105 04/03/2019   CREATININE 3.36 (H)  04/03/2019   BUN 27 (H) 04/03/2019   CO2 24 04/03/2019   TSH 1.250 12/22/2017   INR 1.0 04/03/2019      Assessment / Plan:      68 yo lady with acute chest pain, agree with strong likelihood of Type A dissection despite noncontrasted CT. Plan admit to ICU for pain control and impulse control overnight. Urgent operation 1st thing in AM. She understands the risk of waiting for surgery as well as risk of the surgery for dissection in general, the most extreme of which for her is renal failure requiring HD. She wishes to proceed. Plan is communicated to the ED and to the [facility] ICU  staff.    I  spent 40 minutes counseling the patient face to face.   Breylin Dom Z. Orvan Seen, MD [phone] 04/03/2019 11:40 PM

## 2019-04-04 ENCOUNTER — Inpatient Hospital Stay (HOSPITAL_COMMUNITY): Payer: Medicare Other | Admitting: Certified Registered"

## 2019-04-04 ENCOUNTER — Encounter (HOSPITAL_COMMUNITY): Payer: Self-pay | Admitting: Certified Registered"

## 2019-04-04 ENCOUNTER — Inpatient Hospital Stay (HOSPITAL_COMMUNITY): Payer: Medicare Other

## 2019-04-04 ENCOUNTER — Encounter (HOSPITAL_COMMUNITY)
Admission: EM | Disposition: A | Discharge status: Home / Self Care | Payer: Self-pay | Source: Home / Self Care | Attending: Cardiothoracic Surgery

## 2019-04-04 DIAGNOSIS — I7100 Dissection of unspecified site of aorta: Secondary | ICD-10-CM | POA: Diagnosis present

## 2019-04-04 HISTORY — PX: TEE WITHOUT CARDIOVERSION: SHX5443

## 2019-04-04 HISTORY — PX: THORACIC AORTIC ANEURYSM REPAIR: SHX799

## 2019-04-04 HISTORY — DX: Dissection of unspecified site of aorta: I71.00

## 2019-04-04 LAB — CBC
HCT: 23 % — ABNORMAL LOW (ref 36.0–46.0)
HCT: 31.6 % — ABNORMAL LOW (ref 36.0–46.0)
Hemoglobin: 7.4 g/dL — ABNORMAL LOW (ref 12.0–15.0)
Hemoglobin: 10.3 g/dL — ABNORMAL LOW (ref 12.0–15.0)
MCH: 29.5 pg (ref 26.0–34.0)
MCH: 29.3 pg (ref 26.0–34.0)
MCHC: 32.2 g/dL (ref 30.0–36.0)
MCHC: 32.6 g/dL (ref 30.0–36.0)
MCV: 91.6 fL (ref 80.0–100.0)
MCV: 89.8 fL (ref 80.0–100.0)
Platelets: 175 10*3/uL (ref 150–400)
Platelets: 180 10*3/uL (ref 150–400)
RBC: 2.51 MIL/uL — ABNORMAL LOW (ref 3.87–5.11)
RBC: 3.52 MIL/uL — ABNORMAL LOW (ref 3.87–5.11)
RDW: 16.7 % — ABNORMAL HIGH (ref 11.5–15.5)
RDW: 15.9 % — ABNORMAL HIGH (ref 11.5–15.5)
WBC: 8.5 10*3/uL (ref 4.0–10.5)
WBC: 9.5 10*3/uL (ref 4.0–10.5)
nRBC: 0 % (ref 0.0–0.2)
nRBC: 0 % (ref 0.0–0.2)

## 2019-04-04 LAB — POCT I-STAT, CHEM 8
BUN: 27 mg/dL — ABNORMAL HIGH (ref 8–23)
BUN: 24 mg/dL — ABNORMAL HIGH (ref 8–23)
BUN: 26 mg/dL — ABNORMAL HIGH (ref 8–23)
BUN: 30 mg/dL — ABNORMAL HIGH (ref 8–23)
BUN: 28 mg/dL — ABNORMAL HIGH (ref 8–23)
Calcium, Ion: 1.2 mmol/L (ref 1.15–1.40)
Calcium, Ion: 0.97 mmol/L — ABNORMAL LOW (ref 1.15–1.40)
Calcium, Ion: 1.15 mmol/L (ref 1.15–1.40)
Calcium, Ion: 1.06 mmol/L — ABNORMAL LOW (ref 1.15–1.40)
Calcium, Ion: 0.98 mmol/L — ABNORMAL LOW (ref 1.15–1.40)
Chloride: 99 mmol/L (ref 98–111)
Chloride: 100 mmol/L (ref 98–111)
Chloride: 97 mmol/L — ABNORMAL LOW (ref 98–111)
Chloride: 101 mmol/L (ref 98–111)
Chloride: 104 mmol/L (ref 98–111)
Creatinine, Ser: 3.3 mg/dL — ABNORMAL HIGH (ref 0.44–1.00)
Creatinine, Ser: 2.8 mg/dL — ABNORMAL HIGH (ref 0.44–1.00)
Creatinine, Ser: 3.3 mg/dL — ABNORMAL HIGH (ref 0.44–1.00)
Creatinine, Ser: 2.7 mg/dL — ABNORMAL HIGH (ref 0.44–1.00)
Creatinine, Ser: 2.8 mg/dL — ABNORMAL HIGH (ref 0.44–1.00)
Glucose, Bld: 150 mg/dL — ABNORMAL HIGH (ref 70–99)
Glucose, Bld: 145 mg/dL — ABNORMAL HIGH (ref 70–99)
Glucose, Bld: 163 mg/dL — ABNORMAL HIGH (ref 70–99)
Glucose, Bld: 140 mg/dL — ABNORMAL HIGH (ref 70–99)
Glucose, Bld: 117 mg/dL — ABNORMAL HIGH (ref 70–99)
HCT: 29 % — ABNORMAL LOW (ref 36.0–46.0)
HCT: 23 % — ABNORMAL LOW (ref 36.0–46.0)
HCT: 26 % — ABNORMAL LOW (ref 36.0–46.0)
HCT: 28 % — ABNORMAL LOW (ref 36.0–46.0)
HCT: 26 % — ABNORMAL LOW (ref 36.0–46.0)
Hemoglobin: 9.9 g/dL — ABNORMAL LOW (ref 12.0–15.0)
Hemoglobin: 7.8 g/dL — ABNORMAL LOW (ref 12.0–15.0)
Hemoglobin: 8.8 g/dL — ABNORMAL LOW (ref 12.0–15.0)
Hemoglobin: 9.5 g/dL — ABNORMAL LOW (ref 12.0–15.0)
Hemoglobin: 8.8 g/dL — ABNORMAL LOW (ref 12.0–15.0)
Potassium: 3.2 mmol/L — ABNORMAL LOW (ref 3.5–5.1)
Potassium: 2.7 mmol/L — CL (ref 3.5–5.1)
Potassium: 2.9 mmol/L — ABNORMAL LOW (ref 3.5–5.1)
Potassium: 4.3 mmol/L (ref 3.5–5.1)
Potassium: 3.1 mmol/L — ABNORMAL LOW (ref 3.5–5.1)
Sodium: 140 mmol/L (ref 135–145)
Sodium: 138 mmol/L (ref 135–145)
Sodium: 140 mmol/L (ref 135–145)
Sodium: 138 mmol/L (ref 135–145)
Sodium: 141 mmol/L (ref 135–145)
TCO2: 24 mmol/L (ref 22–32)
TCO2: 25 mmol/L (ref 22–32)
TCO2: 25 mmol/L (ref 22–32)
TCO2: 25 mmol/L (ref 22–32)
TCO2: 23 mmol/L (ref 22–32)

## 2019-04-04 LAB — GLUCOSE, CAPILLARY
Glucose-Capillary: 114 mg/dL — ABNORMAL HIGH (ref 70–99)
Glucose-Capillary: 108 mg/dL — ABNORMAL HIGH (ref 70–99)
Glucose-Capillary: 91 mg/dL (ref 70–99)
Glucose-Capillary: 116 mg/dL — ABNORMAL HIGH (ref 70–99)
Glucose-Capillary: 116 mg/dL — ABNORMAL HIGH (ref 70–99)
Glucose-Capillary: 125 mg/dL — ABNORMAL HIGH (ref 70–99)
Glucose-Capillary: 146 mg/dL — ABNORMAL HIGH (ref 70–99)
Glucose-Capillary: 110 mg/dL — ABNORMAL HIGH (ref 70–99)
Glucose-Capillary: 118 mg/dL — ABNORMAL HIGH (ref 70–99)
Glucose-Capillary: 101 mg/dL — ABNORMAL HIGH (ref 70–99)

## 2019-04-04 LAB — POCT I-STAT 7, (LYTES, BLD GAS, ICA,H+H)
Acid-Base Excess: 1 mmol/L (ref 0.0–2.0)
Acid-base deficit: 1 mmol/L (ref 0.0–2.0)
Acid-base deficit: 2 mmol/L (ref 0.0–2.0)
Acid-base deficit: 3 mmol/L — ABNORMAL HIGH (ref 0.0–2.0)
Acid-base deficit: 5 mmol/L — ABNORMAL HIGH (ref 0.0–2.0)
Bicarbonate: 24.2 mmol/L (ref 20.0–28.0)
Bicarbonate: 26.1 mmol/L (ref 20.0–28.0)
Bicarbonate: 26.2 mmol/L (ref 20.0–28.0)
Bicarbonate: 25.6 mmol/L (ref 20.0–28.0)
Bicarbonate: 21.1 mmol/L (ref 20.0–28.0)
Bicarbonate: 20.3 mmol/L (ref 20.0–28.0)
Calcium, Ion: 1.2 mmol/L (ref 1.15–1.40)
Calcium, Ion: 1.22 mmol/L (ref 1.15–1.40)
Calcium, Ion: 1 mmol/L — ABNORMAL LOW (ref 1.15–1.40)
Calcium, Ion: 1.07 mmol/L — ABNORMAL LOW (ref 1.15–1.40)
Calcium, Ion: 0.97 mmol/L — ABNORMAL LOW (ref 1.15–1.40)
Calcium, Ion: 1.04 mmol/L — ABNORMAL LOW (ref 1.15–1.40)
HCT: 30 % — ABNORMAL LOW (ref 36.0–46.0)
HCT: 28 % — ABNORMAL LOW (ref 36.0–46.0)
HCT: 25 % — ABNORMAL LOW (ref 36.0–46.0)
HCT: 26 % — ABNORMAL LOW (ref 36.0–46.0)
HCT: 26 % — ABNORMAL LOW (ref 36.0–46.0)
HCT: 21 % — ABNORMAL LOW (ref 36.0–46.0)
Hemoglobin: 10.2 g/dL — ABNORMAL LOW (ref 12.0–15.0)
Hemoglobin: 9.5 g/dL — ABNORMAL LOW (ref 12.0–15.0)
Hemoglobin: 8.5 g/dL — ABNORMAL LOW (ref 12.0–15.0)
Hemoglobin: 8.8 g/dL — ABNORMAL LOW (ref 12.0–15.0)
Hemoglobin: 8.8 g/dL — ABNORMAL LOW (ref 12.0–15.0)
Hemoglobin: 7.1 g/dL — ABNORMAL LOW (ref 12.0–15.0)
O2 Saturation: 92 %
O2 Saturation: 100 %
O2 Saturation: 100 %
O2 Saturation: 100 %
O2 Saturation: 100 %
O2 Saturation: 97 %
Patient temperature: 98.8
Patient temperature: 34.9
Potassium: 3.1 mmol/L — ABNORMAL LOW (ref 3.5–5.1)
Potassium: 3.2 mmol/L — ABNORMAL LOW (ref 3.5–5.1)
Potassium: 3.8 mmol/L (ref 3.5–5.1)
Potassium: 4.2 mmol/L (ref 3.5–5.1)
Potassium: 3.1 mmol/L — ABNORMAL LOW (ref 3.5–5.1)
Potassium: 2.6 mmol/L — CL (ref 3.5–5.1)
Sodium: 139 mmol/L (ref 135–145)
Sodium: 139 mmol/L (ref 135–145)
Sodium: 138 mmol/L (ref 135–145)
Sodium: 138 mmol/L (ref 135–145)
Sodium: 140 mmol/L (ref 135–145)
Sodium: 142 mmol/L (ref 135–145)
TCO2: 25 mmol/L (ref 22–32)
TCO2: 27 mmol/L (ref 22–32)
TCO2: 27 mmol/L (ref 22–32)
TCO2: 27 mmol/L (ref 22–32)
TCO2: 22 mmol/L (ref 22–32)
TCO2: 21 mmol/L — ABNORMAL LOW (ref 22–32)
pCO2 arterial: 39.6 mmHg (ref 32.0–48.0)
pCO2 arterial: 46.8 mmHg (ref 32.0–48.0)
pCO2 arterial: 43.4 mmHg (ref 32.0–48.0)
pCO2 arterial: 58.2 mmHg — ABNORMAL HIGH (ref 32.0–48.0)
pCO2 arterial: 33.7 mmHg (ref 32.0–48.0)
pCO2 arterial: 35.8 mmHg (ref 32.0–48.0)
pH, Arterial: 7.396 (ref 7.350–7.450)
pH, Arterial: 7.354 (ref 7.350–7.450)
pH, Arterial: 7.388 (ref 7.350–7.450)
pH, Arterial: 7.252 — ABNORMAL LOW (ref 7.350–7.450)
pH, Arterial: 7.405 (ref 7.350–7.450)
pH, Arterial: 7.351 (ref 7.350–7.450)
pO2, Arterial: 65 mmHg — ABNORMAL LOW (ref 83.0–108.0)
pO2, Arterial: 450 mmHg — ABNORMAL HIGH (ref 83.0–108.0)
pO2, Arterial: 566 mmHg — ABNORMAL HIGH (ref 83.0–108.0)
pO2, Arterial: 443 mmHg — ABNORMAL HIGH (ref 83.0–108.0)
pO2, Arterial: 388 mmHg — ABNORMAL HIGH (ref 83.0–108.0)
pO2, Arterial: 91 mmHg (ref 83.0–108.0)

## 2019-04-04 LAB — BASIC METABOLIC PANEL
Anion gap: 7 (ref 5–15)
BUN: 25 mg/dL — ABNORMAL HIGH (ref 8–23)
CO2: 19 mmol/L — ABNORMAL LOW (ref 22–32)
Calcium: 7.3 mg/dL — ABNORMAL LOW (ref 8.9–10.3)
Chloride: 112 mmol/L — ABNORMAL HIGH (ref 98–111)
Creatinine, Ser: 2.78 mg/dL — ABNORMAL HIGH (ref 0.44–1.00)
GFR calc Af Amer: 20 mL/min — ABNORMAL LOW (ref >60)
GFR calc non Af Amer: 17 mL/min — ABNORMAL LOW (ref >60)
Glucose, Bld: 158 mg/dL — ABNORMAL HIGH (ref 70–99)
Potassium: 4.7 mmol/L (ref 3.5–5.1)
Sodium: 138 mmol/L (ref 135–145)

## 2019-04-04 LAB — PLATELET COUNT: Platelets: 126 10*3/uL — ABNORMAL LOW (ref 150–400)

## 2019-04-04 LAB — APTT: aPTT: 40 seconds — ABNORMAL HIGH (ref 24–36)

## 2019-04-04 LAB — ECHO INTRAOPERATIVE TEE
Height: 63 in
Weight: 2077.62 oz

## 2019-04-04 LAB — PROTIME-INR
INR: 1.3 — ABNORMAL HIGH (ref 0.8–1.2)
Prothrombin Time: 16.1 seconds — ABNORMAL HIGH (ref 11.4–15.2)

## 2019-04-04 LAB — SURGICAL PCR SCREEN
MRSA, PCR: NEGATIVE
Staphylococcus aureus: NEGATIVE

## 2019-04-04 LAB — HEMOGLOBIN AND HEMATOCRIT, BLOOD
HCT: 26.8 % — ABNORMAL LOW (ref 36.0–46.0)
Hemoglobin: 8.7 g/dL — ABNORMAL LOW (ref 12.0–15.0)

## 2019-04-04 LAB — HEMOGLOBIN A1C
Hgb A1c MFr Bld: 6 % — ABNORMAL HIGH (ref 4.8–5.6)
Mean Plasma Glucose: 125.5 mg/dL

## 2019-04-04 LAB — PREPARE RBC (CROSSMATCH)

## 2019-04-04 LAB — SARS CORONAVIRUS 2 BY RT PCR (HOSPITAL ORDER, PERFORMED IN ~~LOC~~ HOSPITAL LAB): SARS Coronavirus 2: NEGATIVE

## 2019-04-04 LAB — MAGNESIUM: Magnesium: 2.1 mg/dL (ref 1.7–2.4)

## 2019-04-04 LAB — FIBRINOGEN: Fibrinogen: 222 mg/dL (ref 210–475)

## 2019-04-04 SURGERY — REPAIR, ANEURYSM, AORTA, THORACIC, ASCENDING
Anesthesia: General

## 2019-04-04 MED ORDER — METOPROLOL TARTRATE 12.5 MG HALF TABLET
12.5000 mg | ORAL_TABLET | Freq: Once | ORAL | Status: AC
  Administered 2019-04-04: 05:00:00 12.5 mg via ORAL
  Filled 2019-04-04: qty 1

## 2019-04-04 MED ORDER — MORPHINE SULFATE (PF) 2 MG/ML IV SOLN
1.0000 mg | INTRAVENOUS | Status: DC | PRN
  Administered 2019-04-05: 4 mg via INTRAVENOUS
  Administered 2019-04-05 – 2019-04-07 (×2): 2 mg via INTRAVENOUS
  Administered 2019-04-07: 16:00:00 3 mg via INTRAVENOUS
  Administered 2019-04-07: 13:00:00 2 mg via INTRAVENOUS
  Administered 2019-04-09: 01:00:00 1 mg via INTRAVENOUS
  Administered 2019-04-09 – 2019-04-11 (×3): 2 mg via INTRAVENOUS
  Filled 2019-04-04: qty 2
  Filled 2019-04-04 (×8): qty 1

## 2019-04-04 MED ORDER — BISACODYL 5 MG PO TBEC
10.0000 mg | DELAYED_RELEASE_TABLET | Freq: Every day | ORAL | Status: DC
  Administered 2019-04-06: 13:00:00 10 mg via ORAL
  Filled 2019-04-04: qty 2

## 2019-04-04 MED ORDER — ASPIRIN EC 325 MG PO TBEC: 325.0000 mg | DELAYED_RELEASE_TABLET | Freq: Every day | ORAL | Status: DC

## 2019-04-04 MED ORDER — LIDOCAINE 2% (20 MG/ML) 5 ML SYRINGE
INTRAMUSCULAR | Status: AC
  Filled 2019-04-04: qty 5

## 2019-04-04 MED ORDER — BISACODYL 10 MG RE SUPP: 10.0000 mg | Freq: Every day | RECTAL | Status: DC

## 2019-04-04 MED ORDER — EPHEDRINE 5 MG/ML INJ
INTRAVENOUS | Status: AC
  Filled 2019-04-04: qty 10

## 2019-04-04 MED ORDER — CHLORHEXIDINE GLUCONATE CLOTH 2 % EX PADS: 6.0000 | MEDICATED_PAD | Freq: Once | CUTANEOUS | Status: DC

## 2019-04-04 MED ORDER — TRANEXAMIC ACID (OHS) BOLUS VIA INFUSION
15.0000 mg/kg | INTRAVENOUS | Status: AC
  Administered 2019-04-04: 09:00:00 883.5 mg via INTRAVENOUS
  Filled 2019-04-04: qty 884

## 2019-04-04 MED ORDER — LACTATED RINGERS IV SOLN: 500.0000 mL | Freq: Once | INTRAVENOUS | Status: DC | PRN

## 2019-04-04 MED ORDER — SODIUM CHLORIDE (PF) 0.9 % IJ SOLN
OROMUCOSAL | Status: DC | PRN
  Administered 2019-04-04 (×3): 4 mL via TOPICAL

## 2019-04-04 MED ORDER — SODIUM CHLORIDE 0.9 % IV SOLN
1.5000 g | INTRAVENOUS | Status: AC
  Administered 2019-04-05: 11:00:00 1.5 g via INTRAVENOUS
  Filled 2019-04-04: qty 1.5

## 2019-04-04 MED ORDER — INSULIN REGULAR(HUMAN) IN NACL 100-0.9 UT/100ML-% IV SOLN: INTRAVENOUS | Status: DC

## 2019-04-04 MED ORDER — ACETAMINOPHEN 500 MG PO TABS
1000.0000 mg | ORAL_TABLET | Freq: Four times a day (QID) | ORAL | Status: AC
  Administered 2019-04-06 – 2019-04-09 (×2): 1000 mg via ORAL
  Filled 2019-04-04 (×3): qty 2

## 2019-04-04 MED ORDER — SODIUM CHLORIDE 0.9 % IV SOLN
250.0000 mL | INTRAVENOUS | Status: DC
  Administered 2019-04-06: 10 mL via INTRAVENOUS
  Administered 2019-04-08: 250 mL via INTRAVENOUS

## 2019-04-04 MED ORDER — SODIUM CHLORIDE 0.9 % IV SOLN
INTRAVENOUS | Status: DC
  Filled 2019-04-04: qty 30

## 2019-04-04 MED ORDER — SODIUM CHLORIDE 0.45 % IV SOLN: INTRAVENOUS | Status: DC | PRN

## 2019-04-04 MED ORDER — SODIUM CHLORIDE 0.9 % IV SOLN
1.5000 g | INTRAVENOUS | Status: AC
  Administered 2019-04-04: 12:00:00 .75 g via INTRAVENOUS
  Administered 2019-04-04: 08:00:00 1.5 g via INTRAVENOUS
  Filled 2019-04-04: qty 1.5

## 2019-04-04 MED ORDER — HEPARIN SODIUM (PORCINE) 1000 UNIT/ML IJ SOLN
INTRAMUSCULAR | Status: DC | PRN
  Administered 2019-04-04: 17 mL via INTRAVENOUS
  Administered 2019-04-04: 3 mL via INTRAVENOUS

## 2019-04-04 MED ORDER — METOPROLOL TARTRATE 5 MG/5ML IV SOLN
2.5000 mg | INTRAVENOUS | Status: DC | PRN
  Administered 2019-04-05 – 2019-04-10 (×5): 5 mg via INTRAVENOUS
  Administered 2019-04-11 (×2): 2.5 mg via INTRAVENOUS
  Administered 2019-04-11 – 2019-04-12 (×2): 5 mg via INTRAVENOUS
  Filled 2019-04-04 (×8): qty 5

## 2019-04-04 MED ORDER — METHYLPREDNISOLONE SODIUM SUCC 125 MG IJ SOLR
INTRAMUSCULAR | Status: DC | PRN
  Administered 2019-04-04: 125 mg via INTRAVENOUS

## 2019-04-04 MED ORDER — DOPAMINE-DEXTROSE 3.2-5 MG/ML-% IV SOLN: 0.0000 ug/kg/min | INTRAVENOUS | Status: DC

## 2019-04-04 MED ORDER — LACTATED RINGERS IV SOLN: INTRAVENOUS | Status: DC

## 2019-04-04 MED ORDER — BISACODYL 5 MG PO TBEC: 5.0000 mg | DELAYED_RELEASE_TABLET | Freq: Once | ORAL | Status: DC

## 2019-04-04 MED ORDER — PROTAMINE SULFATE 10 MG/ML IV SOLN
INTRAVENOUS | Status: DC | PRN
  Administered 2019-04-04: 30 mg via INTRAVENOUS
  Administered 2019-04-04: 80 mg via INTRAVENOUS
  Administered 2019-04-04: 90 mg via INTRAVENOUS

## 2019-04-04 MED ORDER — MAGNESIUM SULFATE 50 % IJ SOLN
40.0000 meq | INTRAMUSCULAR | Status: DC
  Filled 2019-04-04: qty 9.85

## 2019-04-04 MED ORDER — ASPIRIN 81 MG PO CHEW
324.0000 mg | CHEWABLE_TABLET | Freq: Every day | ORAL | Status: DC
  Administered 2019-04-05: 324 mg
  Filled 2019-04-04: qty 4

## 2019-04-04 MED ORDER — PROPOFOL 10 MG/ML IV BOLUS
INTRAVENOUS | Status: DC | PRN
  Administered 2019-04-04: 100 mg via INTRAVENOUS
  Administered 2019-04-04: 50 mg via INTRAVENOUS
  Administered 2019-04-04: 100 mg via INTRAVENOUS

## 2019-04-04 MED ORDER — VANCOMYCIN HCL 1000 MG IV SOLR
INTRAVENOUS | Status: DC | PRN
  Administered 2019-04-04: 1000 mL

## 2019-04-04 MED ORDER — INSULIN REGULAR BOLUS VIA INFUSION
0.0000 [IU] | Freq: Three times a day (TID) | INTRAVENOUS | Status: DC
  Filled 2019-04-04: qty 10

## 2019-04-04 MED ORDER — METOPROLOL TARTRATE 12.5 MG HALF TABLET
12.5000 mg | ORAL_TABLET | Freq: Two times a day (BID) | ORAL | Status: DC
  Administered 2019-04-06 – 2019-04-11 (×8): 12.5 mg via ORAL
  Filled 2019-04-04 (×8): qty 1

## 2019-04-04 MED ORDER — FENTANYL CITRATE (PF) 250 MCG/5ML IJ SOLN
INTRAMUSCULAR | Status: AC
  Filled 2019-04-04: qty 5

## 2019-04-04 MED ORDER — PHENYLEPHRINE HCL-NACL 20-0.9 MG/250ML-% IV SOLN: 30.0000 ug/min | INTRAVENOUS | Status: DC

## 2019-04-04 MED ORDER — MAGNESIUM SULFATE 4 GM/100ML IV SOLN
4.0000 g | Freq: Once | INTRAVENOUS | Status: DC
  Filled 2019-04-04 (×2): qty 100

## 2019-04-04 MED ORDER — LACTATED RINGERS IV SOLN
INTRAVENOUS | Status: DC
  Administered 2019-04-04: 14:00:00 via INTRAVENOUS

## 2019-04-04 MED ORDER — TRANEXAMIC ACID 1000 MG/10ML IV SOLN
1.5000 mg/kg/h | INTRAVENOUS | Status: AC
  Administered 2019-04-04: 09:00:00 1.5 mg/kg/h via INTRAVENOUS
  Filled 2019-04-04: qty 25

## 2019-04-04 MED ORDER — FAMOTIDINE IN NACL 20-0.9 MG/50ML-% IV SOLN
20.0000 mg | Freq: Two times a day (BID) | INTRAVENOUS | Status: AC
  Administered 2019-04-04 (×2): 20 mg via INTRAVENOUS
  Filled 2019-04-04: qty 50

## 2019-04-04 MED ORDER — MIDAZOLAM HCL (PF) 10 MG/2ML IJ SOLN
INTRAMUSCULAR | Status: AC
  Filled 2019-04-04: qty 2

## 2019-04-04 MED ORDER — DEXMEDETOMIDINE HCL IN NACL 400 MCG/100ML IV SOLN
0.0000 ug/kg/h | INTRAVENOUS | Status: DC
  Administered 2019-04-05: 0.3 ug/kg/h via INTRAVENOUS
  Filled 2019-04-04: qty 100

## 2019-04-04 MED ORDER — VANCOMYCIN HCL 1000 MG IV SOLR
INTRAVENOUS | Status: AC
  Filled 2019-04-04: qty 3000

## 2019-04-04 MED ORDER — TRANEXAMIC ACID (OHS) PUMP PRIME SOLUTION
2.0000 mg/kg | INTRAVENOUS | Status: DC
  Filled 2019-04-04: qty 1.18

## 2019-04-04 MED ORDER — ONDANSETRON HCL 4 MG/2ML IJ SOLN
INTRAMUSCULAR | Status: AC
  Filled 2019-04-04: qty 2

## 2019-04-04 MED ORDER — ALBUMIN HUMAN 5 % IV SOLN
250.0000 mL | INTRAVENOUS | Status: AC | PRN
  Administered 2019-04-04 (×3): 12.5 g via INTRAVENOUS
  Filled 2019-04-04: qty 500

## 2019-04-04 MED ORDER — DOPAMINE-DEXTROSE 3.2-5 MG/ML-% IV SOLN
0.0000 ug/kg/min | INTRAVENOUS | Status: AC
  Administered 2019-04-04: 3 ug/kg/min via INTRAVENOUS

## 2019-04-04 MED ORDER — SODIUM CHLORIDE 0.9 % IV SOLN
INTRAVENOUS | Status: DC
  Administered 2019-04-04: 14:00:00 via INTRAVENOUS

## 2019-04-04 MED ORDER — SODIUM CHLORIDE 0.9 % IV SOLN
INTRAVENOUS | Status: DC | PRN
  Administered 2019-04-04 (×2): via INTRAVENOUS

## 2019-04-04 MED ORDER — SODIUM CHLORIDE 0.9% FLUSH: 3.0000 mL | INTRAVENOUS | Status: DC | PRN

## 2019-04-04 MED ORDER — PROTAMINE SULFATE 10 MG/ML IV SOLN
INTRAVENOUS | Status: AC
  Filled 2019-04-04: qty 25

## 2019-04-04 MED ORDER — CHLORHEXIDINE GLUCONATE 0.12% ORAL RINSE (MEDLINE KIT)
15.0000 mL | Freq: Two times a day (BID) | OROMUCOSAL | Status: DC
  Administered 2019-04-04 – 2019-04-06 (×4): 15 mL via OROMUCOSAL

## 2019-04-04 MED ORDER — PHENYLEPHRINE HCL-NACL 20-0.9 MG/250ML-% IV SOLN: 0.0000 ug/min | INTRAVENOUS | Status: DC

## 2019-04-04 MED ORDER — SODIUM BICARBONATE 8.4 % IV SOLN
INTRAVENOUS | Status: DC
  Administered 2019-04-04: 01:00:00 via INTRAVENOUS
  Filled 2019-04-04: qty 50

## 2019-04-04 MED ORDER — HEMOSTATIC AGENTS (NO CHARGE) OPTIME
TOPICAL | Status: DC | PRN
  Administered 2019-04-04 (×5): 1 via TOPICAL

## 2019-04-04 MED ORDER — LACTATED RINGERS IV SOLN
INTRAVENOUS | Status: DC | PRN
  Administered 2019-04-04: 08:00:00 via INTRAVENOUS

## 2019-04-04 MED ORDER — SODIUM CHLORIDE 0.9% IV SOLUTION
Freq: Once | INTRAVENOUS | Status: AC
  Administered 2019-04-06: 12:00:00 via INTRAVENOUS

## 2019-04-04 MED ORDER — CHLORHEXIDINE GLUCONATE 0.12 % MT SOLN
15.0000 mL | Freq: Once | OROMUCOSAL | Status: AC
  Administered 2019-04-04: 15 mL via OROMUCOSAL
  Filled 2019-04-04: qty 15

## 2019-04-04 MED ORDER — CALCIUM CHLORIDE 10 % IV SOLN
INTRAVENOUS | Status: AC
  Filled 2019-04-04: qty 10

## 2019-04-04 MED ORDER — VANCOMYCIN HCL 1000 MG IV SOLR
INTRAVENOUS | Status: AC
  Filled 2019-04-04: qty 1000

## 2019-04-04 MED ORDER — SODIUM CHLORIDE 0.9 % IV SOLN
INTRAVENOUS | Status: DC | PRN
  Administered 2019-04-04: 10:00:00 via INTRAVENOUS

## 2019-04-04 MED ORDER — PLASMA-LYTE 148 IV SOLN
INTRAVENOUS | Status: DC
  Filled 2019-04-04: qty 2.5

## 2019-04-04 MED ORDER — OXYCODONE HCL 5 MG PO TABS
5.0000 mg | ORAL_TABLET | ORAL | Status: DC | PRN
  Administered 2019-04-09: 09:00:00 5 mg via ORAL
  Administered 2019-04-09 – 2019-04-22 (×28): 10 mg via ORAL
  Filled 2019-04-04 (×3): qty 2
  Filled 2019-04-04: qty 1
  Filled 2019-04-04 (×7): qty 2
  Filled 2019-04-04: qty 1
  Filled 2019-04-04 (×18): qty 2

## 2019-04-04 MED ORDER — LACTATED RINGERS IV SOLN
INTRAVENOUS | Status: DC | PRN
  Administered 2019-04-04: 07:00:00 via INTRAVENOUS

## 2019-04-04 MED ORDER — ACETAMINOPHEN 160 MG/5ML PO SOLN: 650.0000 mg | Freq: Once | ORAL | Status: DC

## 2019-04-04 MED ORDER — EPINEPHRINE HCL 5 MG/250ML IV SOLN IN NS: 0.0000 ug/min | INTRAVENOUS | Status: DC

## 2019-04-04 MED ORDER — MILRINONE LACTATE IN DEXTROSE 20-5 MG/100ML-% IV SOLN: 0.3000 ug/kg/min | INTRAVENOUS | Status: DC

## 2019-04-04 MED ORDER — FENTANYL CITRATE (PF) 250 MCG/5ML IJ SOLN
INTRAMUSCULAR | Status: DC | PRN
  Administered 2019-04-04: 100 ug via INTRAVENOUS
  Administered 2019-04-04: 150 ug via INTRAVENOUS
  Administered 2019-04-04: 50 ug via INTRAVENOUS
  Administered 2019-04-04: 100 ug via INTRAVENOUS
  Administered 2019-04-04: 250 ug via INTRAVENOUS
  Administered 2019-04-04 (×3): 50 ug via INTRAVENOUS
  Administered 2019-04-04: 250 ug via INTRAVENOUS
  Administered 2019-04-04: 50 ug via INTRAVENOUS
  Administered 2019-04-04: 250 ug via INTRAVENOUS
  Administered 2019-04-04: 150 ug via INTRAVENOUS

## 2019-04-04 MED ORDER — ORAL CARE MOUTH RINSE
15.0000 mL | OROMUCOSAL | Status: DC
  Administered 2019-04-04 – 2019-04-06 (×9): 15 mL via OROMUCOSAL

## 2019-04-04 MED ORDER — NOREPINEPHRINE 4 MG/250ML-% IV SOLN
INTRAVENOUS | Status: DC | PRN
  Administered 2019-04-04: 20 ug/min via INTRAVENOUS

## 2019-04-04 MED ORDER — ACETAMINOPHEN 650 MG RE SUPP: 650.0000 mg | Freq: Once | RECTAL | Status: DC

## 2019-04-04 MED ORDER — INSULIN REGULAR(HUMAN) IN NACL 100-0.9 UT/100ML-% IV SOLN
INTRAVENOUS | Status: AC
  Administered 2019-04-04: 09:00:00 1.8 [IU]/h via INTRAVENOUS
  Filled 2019-04-04: qty 100

## 2019-04-04 MED ORDER — NITROGLYCERIN IN D5W 200-5 MCG/ML-% IV SOLN
0.0000 ug/min | INTRAVENOUS | Status: DC
  Administered 2019-04-05: 100 ug/min via INTRAVENOUS
  Administered 2019-04-06: 11:00:00 50 ug/min via INTRAVENOUS
  Administered 2019-04-07: 60 ug/min via INTRAVENOUS
  Administered 2019-04-07: 100 ug/min via INTRAVENOUS
  Filled 2019-04-04 (×4): qty 250

## 2019-04-04 MED ORDER — SODIUM CHLORIDE 0.9% FLUSH
3.0000 mL | Freq: Two times a day (BID) | INTRAVENOUS | Status: DC
  Administered 2019-04-05: 3 mL via INTRAVENOUS
  Administered 2019-04-06: 7 mL via INTRAVENOUS
  Administered 2019-04-06 – 2019-04-10 (×7): 3 mL via INTRAVENOUS

## 2019-04-04 MED ORDER — SODIUM CHLORIDE 0.9 % IV SOLN
750.0000 mg | INTRAVENOUS | Status: DC
  Filled 2019-04-04: qty 750

## 2019-04-04 MED ORDER — DOCUSATE SODIUM 100 MG PO CAPS
200.0000 mg | ORAL_CAPSULE | Freq: Every day | ORAL | Status: DC
  Filled 2019-04-04: qty 2

## 2019-04-04 MED ORDER — ONDANSETRON HCL 4 MG/2ML IJ SOLN
4.0000 mg | Freq: Four times a day (QID) | INTRAMUSCULAR | Status: DC | PRN
  Administered 2019-04-05 – 2019-04-22 (×5): 4 mg via INTRAVENOUS
  Filled 2019-04-04 (×4): qty 2

## 2019-04-04 MED ORDER — SODIUM CHLORIDE 0.9% FLUSH: 10.0000 mL | INTRAVENOUS | Status: DC | PRN

## 2019-04-04 MED ORDER — CHLORHEXIDINE GLUCONATE CLOTH 2 % EX PADS
6.0000 | MEDICATED_PAD | Freq: Every day | CUTANEOUS | Status: DC
  Administered 2019-04-04 – 2019-04-23 (×14): 6 via TOPICAL

## 2019-04-04 MED ORDER — PANTOPRAZOLE SODIUM 40 MG PO TBEC
40.0000 mg | DELAYED_RELEASE_TABLET | Freq: Every day | ORAL | Status: DC
  Filled 2019-04-04: qty 1

## 2019-04-04 MED ORDER — NOREPINEPHRINE 4 MG/250ML-% IV SOLN
0.0000 ug/min | INTRAVENOUS | Status: DC
  Administered 2019-04-06: 03:00:00 10 ug/min via INTRAVENOUS
  Filled 2019-04-04: qty 250

## 2019-04-04 MED ORDER — VANCOMYCIN HCL 1000 MG IV SOLR
INTRAVENOUS | Status: DC | PRN
  Administered 2019-04-04: 3 g via TOPICAL

## 2019-04-04 MED ORDER — 0.9 % SODIUM CHLORIDE (POUR BTL) OPTIME
TOPICAL | Status: DC | PRN
  Administered 2019-04-04: 4000 mL

## 2019-04-04 MED ORDER — VANCOMYCIN HCL 10 G IV SOLR
1250.0000 mg | INTRAVENOUS | Status: AC
  Administered 2019-04-04: 09:00:00 1250 mg via INTRAVENOUS
  Filled 2019-04-04: qty 1250

## 2019-04-04 MED ORDER — CALCIUM CHLORIDE 10 % IV SOLN
INTRAVENOUS | Status: DC | PRN
  Administered 2019-04-04 (×5): 100 mg via INTRAVENOUS

## 2019-04-04 MED ORDER — METOPROLOL TARTRATE 12.5 MG HALF TABLET: 12.5000 mg | ORAL_TABLET | Freq: Once | ORAL | Status: DC

## 2019-04-04 MED ORDER — ONDANSETRON HCL 4 MG/2ML IJ SOLN
INTRAMUSCULAR | Status: DC | PRN
  Administered 2019-04-04: 4 mg via INTRAVENOUS

## 2019-04-04 MED ORDER — ROCURONIUM BROMIDE 10 MG/ML (PF) SYRINGE
PREFILLED_SYRINGE | INTRAVENOUS | Status: AC
  Filled 2019-04-04: qty 10

## 2019-04-04 MED ORDER — VANCOMYCIN HCL IN DEXTROSE 1-5 GM/200ML-% IV SOLN
1000.0000 mg | Freq: Once | INTRAVENOUS | Status: AC
  Administered 2019-04-05: 1000 mg via INTRAVENOUS
  Filled 2019-04-04: qty 200

## 2019-04-04 MED ORDER — ARTIFICIAL TEARS OPHTHALMIC OINT
TOPICAL_OINTMENT | OPHTHALMIC | Status: AC
  Filled 2019-04-04: qty 3.5

## 2019-04-04 MED ORDER — TEMAZEPAM 15 MG PO CAPS: 15.0000 mg | ORAL_CAPSULE | Freq: Once | ORAL | Status: DC | PRN

## 2019-04-04 MED ORDER — MIDAZOLAM HCL 5 MG/5ML IJ SOLN
INTRAMUSCULAR | Status: DC | PRN
  Administered 2019-04-04: 4 mg via INTRAVENOUS
  Administered 2019-04-04 (×2): 1 mg via INTRAVENOUS
  Administered 2019-04-04 (×2): 2 mg via INTRAVENOUS

## 2019-04-04 MED ORDER — METOPROLOL TARTRATE 25 MG/10 ML ORAL SUSPENSION
12.5000 mg | Freq: Two times a day (BID) | ORAL | Status: DC
  Administered 2019-04-05: 12.5 mg
  Filled 2019-04-04: qty 5

## 2019-04-04 MED ORDER — MIDAZOLAM HCL 2 MG/2ML IJ SOLN
2.0000 mg | INTRAMUSCULAR | Status: DC | PRN
  Administered 2019-04-05 (×2): 2 mg via INTRAVENOUS
  Administered 2019-04-06: 17:00:00 1 mg via INTRAVENOUS
  Filled 2019-04-04 (×3): qty 2

## 2019-04-04 MED ORDER — DEXMEDETOMIDINE HCL IN NACL 400 MCG/100ML IV SOLN
0.1000 ug/kg/h | INTRAVENOUS | Status: AC
  Administered 2019-04-04: 0.4 ug/kg/h via INTRAVENOUS

## 2019-04-04 MED ORDER — ROCURONIUM BROMIDE 50 MG/5ML IV SOSY
PREFILLED_SYRINGE | INTRAVENOUS | Status: DC | PRN
  Administered 2019-04-04: 20 mg via INTRAVENOUS
  Administered 2019-04-04 (×2): 50 mg via INTRAVENOUS
  Administered 2019-04-04: 80 mg via INTRAVENOUS

## 2019-04-04 MED ORDER — LIDOCAINE 2% (20 MG/ML) 5 ML SYRINGE
INTRAMUSCULAR | Status: DC | PRN
  Administered 2019-04-04: 100 mg via INTRAVENOUS

## 2019-04-04 MED ORDER — PROPOFOL 10 MG/ML IV BOLUS
INTRAVENOUS | Status: AC
  Filled 2019-04-04: qty 20

## 2019-04-04 MED ORDER — NITROGLYCERIN IN D5W 200-5 MCG/ML-% IV SOLN: 2.0000 ug/min | INTRAVENOUS | Status: DC

## 2019-04-04 MED ORDER — TRAMADOL HCL 50 MG PO TABS
50.0000 mg | ORAL_TABLET | Freq: Two times a day (BID) | ORAL | Status: DC | PRN
  Administered 2019-04-12: 04:00:00 50 mg via ORAL
  Administered 2019-04-13 – 2019-04-15 (×2): 100 mg via ORAL
  Administered 2019-04-20: 09:00:00 50 mg via ORAL
  Filled 2019-04-04: qty 1
  Filled 2019-04-04: qty 2
  Filled 2019-04-04: qty 1
  Filled 2019-04-04: qty 2

## 2019-04-04 MED ORDER — CHLORHEXIDINE GLUCONATE 0.12 % MT SOLN
15.0000 mL | OROMUCOSAL | Status: AC
  Administered 2019-04-04: 15 mL via OROMUCOSAL

## 2019-04-04 MED ORDER — ALBUMIN HUMAN 5 % IV SOLN
INTRAVENOUS | Status: DC | PRN
  Administered 2019-04-04 (×2): via INTRAVENOUS

## 2019-04-04 MED ORDER — BUPIVACAINE LIPOSOME 1.3 % IJ SUSP
20.0000 mL | Freq: Once | INTRAMUSCULAR | Status: DC
  Filled 2019-04-04: qty 20

## 2019-04-04 MED ORDER — BUPIVACAINE HCL (PF) 0.5 % IJ SOLN
INTRAMUSCULAR | Status: AC
  Filled 2019-04-04: qty 30

## 2019-04-04 MED ORDER — CHLORHEXIDINE GLUCONATE 0.12 % MT SOLN: 15.0000 mL | Freq: Once | OROMUCOSAL | Status: DC

## 2019-04-04 MED ORDER — PHENYLEPHRINE 40 MCG/ML (10ML) SYRINGE FOR IV PUSH (FOR BLOOD PRESSURE SUPPORT)
PREFILLED_SYRINGE | INTRAVENOUS | Status: AC
  Filled 2019-04-04: qty 10

## 2019-04-04 MED ORDER — SODIUM CHLORIDE 0.9 % IV SOLN
20.0000 ug | Freq: Once | INTRAVENOUS | Status: AC
  Administered 2019-04-04: 13:00:00 20 ug via INTRAVENOUS
  Filled 2019-04-04: qty 5

## 2019-04-04 MED ORDER — SODIUM CHLORIDE 0.9% IV SOLUTION: Freq: Once | INTRAVENOUS | Status: DC

## 2019-04-04 MED ORDER — SODIUM CHLORIDE 0.9% FLUSH
10.0000 mL | Freq: Two times a day (BID) | INTRAVENOUS | Status: DC
  Administered 2019-04-04 – 2019-04-06 (×4): 10 mL
  Administered 2019-04-06: 20 mL
  Administered 2019-04-06 – 2019-04-12 (×10): 10 mL

## 2019-04-04 MED ORDER — ACETAMINOPHEN 160 MG/5ML PO SOLN: 1000.0000 mg | Freq: Four times a day (QID) | ORAL | Status: AC

## 2019-04-04 MED ORDER — HEPARIN SODIUM (PORCINE) 1000 UNIT/ML IJ SOLN
INTRAMUSCULAR | Status: AC
  Filled 2019-04-04: qty 1

## 2019-04-04 MED ORDER — POTASSIUM CHLORIDE 2 MEQ/ML IV SOLN
80.0000 meq | INTRAVENOUS | Status: DC
  Filled 2019-04-04: qty 40

## 2019-04-04 MED ORDER — POTASSIUM CHLORIDE 10 MEQ/50ML IV SOLN
10.0000 meq | INTRAVENOUS | Status: AC
  Administered 2019-04-04 (×3): 10 meq via INTRAVENOUS

## 2019-04-04 SURGICAL SUPPLY — 131 items
ADAPTER CARDIO PERF ANTE/RETRO (ADAPTER) ×2 IMPLANT
ADH SKN CLS APL DERMABOND .7 (GAUZE/BANDAGES/DRESSINGS) ×1
ADH SRG 12 PREFL SYR 3 SPRDR (MISCELLANEOUS) ×1
ADPR PRFSN 84XANTGRD RTRGD (ADAPTER) ×1
BAG DECANTER FOR FLEXI CONT (MISCELLANEOUS) ×2 IMPLANT
BLADE CLIPPER SURG (BLADE) ×1 IMPLANT
BLADE STERNUM SYSTEM 6 (BLADE) ×2 IMPLANT
BLADE SURG 15 STRL LF DISP TIS (BLADE) ×1 IMPLANT
BLADE SURG 15 STRL SS (BLADE) ×2
CANISTER SUCT 3000ML PPV (MISCELLANEOUS) ×3 IMPLANT
CANN PRFSN .5XCNCT 15X34-48 (MISCELLANEOUS)
CANNULA PRFSN .5XCNCT 15X34-48 (MISCELLANEOUS) IMPLANT
CANNULA VEN 2 STAGE (MISCELLANEOUS) ×1 IMPLANT
CATH CPB KIT GERHARDT (MISCELLANEOUS) ×1 IMPLANT
CATH RETROPLEGIA CORONARY 14FR (CATHETERS) ×2 IMPLANT
CATH THORACIC 28FR (CATHETERS) IMPLANT
CLIP VESOCCLUDE LG 6/CT (CLIP) ×1 IMPLANT
CLIP VESOCCLUDE MED 6/CT (CLIP) ×2 IMPLANT
CLIP VESOCCLUDE SM WIDE 24/CT (CLIP) ×1 IMPLANT
CLIP VESOCCLUDE SM WIDE 6/CT (CLIP) ×1 IMPLANT
CONN 1/2X1/2X1/2  BEN (MISCELLANEOUS) ×1
CONN 1/2X1/2X1/2 BEN (MISCELLANEOUS) IMPLANT
CONN 3/8X3/8 GISH STERILE (MISCELLANEOUS) IMPLANT
CONN ST 1/4X3/8  BEN (MISCELLANEOUS) ×2
CONN ST 1/4X3/8 BEN (MISCELLANEOUS) IMPLANT
CONN Y 3/8X3/8X3/8  BEN (MISCELLANEOUS)
CONN Y 3/8X3/8X3/8 BEN (MISCELLANEOUS) IMPLANT
CONT SPEC 4OZ CLIKSEAL STRL BL (MISCELLANEOUS) ×1 IMPLANT
COVER WAND RF STERILE (DRAPES) ×1 IMPLANT
DERMABOND ADVANCED (GAUZE/BANDAGES/DRESSINGS) ×1
DERMABOND ADVANCED .7 DNX12 (GAUZE/BANDAGES/DRESSINGS) IMPLANT
DRAIN CHANNEL 15F RND FF W/TCR (WOUND CARE) IMPLANT
DRAIN CHANNEL 19F RND (DRAIN) IMPLANT
DRAIN CHANNEL 28F RND 3/8 FF (WOUND CARE) ×2 IMPLANT
DRAIN SNY 10X20 3/4 PERF (WOUND CARE) IMPLANT
DRAIN WOUND SNY 15 RND (WOUND CARE) IMPLANT
DRAPE STERI IOBAN 125X83 (DRAPES) ×1 IMPLANT
DRSG AQUACEL AG ADV 3.5X14 (GAUZE/BANDAGES/DRESSINGS) ×2 IMPLANT
ELECT BLADE 4.0 EZ CLEAN MEGAD (MISCELLANEOUS) ×2
ELECT CAUTERY BLADE 6.4 (BLADE) ×2 IMPLANT
ELECT REM PT RETURN 9FT ADLT (ELECTROSURGICAL) ×4
ELECTRODE BLDE 4.0 EZ CLN MEGD (MISCELLANEOUS) ×1 IMPLANT
ELECTRODE REM PT RTRN 9FT ADLT (ELECTROSURGICAL) ×2 IMPLANT
EVACUATOR SILICONE 100CC (DRAIN) IMPLANT
FELT TEFLON 1X6 (MISCELLANEOUS) ×2 IMPLANT
FELT TEFLON 6X6 (MISCELLANEOUS) IMPLANT
GAUZE SPONGE 4X4 12PLY STRL (GAUZE/BANDAGES/DRESSINGS) ×3 IMPLANT
GLOVE BIO SURGEON STRL SZ 6.5 (GLOVE) ×7 IMPLANT
GLOVE BIOGEL PI IND STRL 6 (GLOVE) IMPLANT
GLOVE BIOGEL PI IND STRL 7.0 (GLOVE) IMPLANT
GLOVE BIOGEL PI INDICATOR 6 (GLOVE) ×7
GLOVE BIOGEL PI INDICATOR 7.0 (GLOVE) ×1
GOWN STRL REUS W/ TWL LRG LVL3 (GOWN DISPOSABLE) ×4 IMPLANT
GOWN STRL REUS W/TWL LRG LVL3 (GOWN DISPOSABLE) ×14
GRAFT CV 30X8WVN NDL (Graft) IMPLANT
GRAFT HEMASHIELD 8MM (Graft) ×2 IMPLANT
GRAFT WOVEN D/V 28DX30L (Vascular Products) ×1 IMPLANT
HEMOSTAT POWDER SURGIFOAM 1G (HEMOSTASIS) ×3 IMPLANT
HEMOSTAT SNOW SURGICEL 2X4 (HEMOSTASIS) ×1 IMPLANT
HEMOSTAT SURGICEL 2X14 (HEMOSTASIS) ×1 IMPLANT
INSERT FOGARTY SM (MISCELLANEOUS) ×3 IMPLANT
INSERT FOGARTY XLG (MISCELLANEOUS) ×1 IMPLANT
KIT BASIN OR (CUSTOM PROCEDURE TRAY) ×2 IMPLANT
KIT SUCTION CATH 14FR (SUCTIONS) IMPLANT
KIT TURNOVER KIT B (KITS) ×2 IMPLANT
LEAD PACING MYOCARDI (MISCELLANEOUS) ×2 IMPLANT
LINE VENT (MISCELLANEOUS) ×1 IMPLANT
LOOP VESSEL SUPERMAXI WHITE (MISCELLANEOUS) ×1 IMPLANT
NDL AORTIC AIR ASPIRATING (NEEDLE) IMPLANT
NEEDLE AORTIC AIR ASPIRATING (NEEDLE) IMPLANT
NS IRRIG 1000ML POUR BTL (IV SOLUTION) ×8 IMPLANT
PACK E OPEN HEART (SUTURE) ×1 IMPLANT
PACK OPEN HEART (CUSTOM PROCEDURE TRAY) ×2 IMPLANT
PAD ARMBOARD 7.5X6 YLW CONV (MISCELLANEOUS) ×4 IMPLANT
POSITIONER HEAD DONUT 9IN (MISCELLANEOUS) ×1 IMPLANT
POWDER SURGICEL 3.0 GRAM (HEMOSTASIS) ×1 IMPLANT
SEALANT SURG COSEAL 8ML (VASCULAR PRODUCTS) ×1 IMPLANT
SET CARDIOPLEGIA MPS 5001102 (MISCELLANEOUS) ×1 IMPLANT
SPONGE LAP 18X18 RF (DISPOSABLE) IMPLANT
SPONGE LAP 4X18 RFD (DISPOSABLE) ×1 IMPLANT
STAPLER VISISTAT 35W (STAPLE) IMPLANT
STOPCOCK 4 WAY LG BORE MALE ST (IV SETS) IMPLANT
STRIP CLOSURE SKIN 1/2X4 (GAUZE/BANDAGES/DRESSINGS) IMPLANT
SUT ETHIBOND 2 0 SH (SUTURE) ×8
SUT ETHIBOND 2 0 SH 36X2 (SUTURE) ×4 IMPLANT
SUT ETHIBOND X763 2 0 SH 1 (SUTURE) ×2 IMPLANT
SUT MNCRL AB 3-0 PS2 18 (SUTURE) IMPLANT
SUT PROLENE 3 0 RB 1 (SUTURE) ×2 IMPLANT
SUT PROLENE 3 0 SH 1 (SUTURE) ×2 IMPLANT
SUT PROLENE 3 0 SH 48 (SUTURE) ×1 IMPLANT
SUT PROLENE 3 0 SH DA (SUTURE) ×4 IMPLANT
SUT PROLENE 3 0 SH1 36 (SUTURE) ×2 IMPLANT
SUT PROLENE 4 0 RB 1 (SUTURE) ×10
SUT PROLENE 4 0 SH DA (SUTURE) ×3 IMPLANT
SUT PROLENE 4 0 TF (SUTURE) ×4 IMPLANT
SUT PROLENE 4-0 RB1 .5 CRCL 36 (SUTURE) IMPLANT
SUT PROLENE 5 0 C 1 36 (SUTURE) ×5 IMPLANT
SUT PROLENE 6 0 CC (SUTURE) IMPLANT
SUT PROLENE 7 0 BV 1 (SUTURE) IMPLANT
SUT PROLENE 7 0 BV1 MDA (SUTURE) IMPLANT
SUT SILK 1 TIES 10X30 (SUTURE) ×2 IMPLANT
SUT SILK 2 0 SH CR/8 (SUTURE) ×4 IMPLANT
SUT SILK 2 0 TIES 17X18 (SUTURE) ×2
SUT SILK 2-0 18XBRD TIE BLK (SUTURE) ×1 IMPLANT
SUT SILK 3 0 SH CR/8 (SUTURE) ×2 IMPLANT
SUT SILK 3 0 TIES 10X30 (SUTURE) ×1 IMPLANT
SUT SILK 4 0 REEL (SUTURE) ×1 IMPLANT
SUT STEEL 6MS V (SUTURE) ×2 IMPLANT
SUT STEEL STERNAL CCS#1 18IN (SUTURE) IMPLANT
SUT STEEL SZ 6 DBL 3X14 BALL (SUTURE) ×1 IMPLANT
SUT TEM PAC WIRE 2 0 SH (SUTURE) ×4 IMPLANT
SUT VIC AB 1 CT1 18XCR BRD 8 (SUTURE) IMPLANT
SUT VIC AB 1 CT1 8-18 (SUTURE)
SUT VIC AB 1 CTX 18 (SUTURE) ×4 IMPLANT
SUT VIC AB 1 CTX 27 (SUTURE) ×4 IMPLANT
SUT VIC AB 2-0 CT1 27 (SUTURE) ×2
SUT VIC AB 2-0 CT1 TAPERPNT 27 (SUTURE) IMPLANT
SUT VIC AB 2-0 CTX 36 (SUTURE) ×2 IMPLANT
SUT VIC AB 3-0 SH 27 (SUTURE)
SUT VIC AB 3-0 SH 27X BRD (SUTURE) IMPLANT
SUT VIC AB 3-0 X1 27 (SUTURE) IMPLANT
SUT VICRYL 4-0 PS2 18IN ABS (SUTURE) ×2 IMPLANT
SYR 10ML KIT SKIN ADHESIVE (MISCELLANEOUS) ×1 IMPLANT
SYSTEM SAHARA CHEST DRAIN ATS (WOUND CARE) ×2 IMPLANT
TOWEL GREEN STERILE (TOWEL DISPOSABLE) ×2 IMPLANT
TOWEL GREEN STERILE FF (TOWEL DISPOSABLE) ×2 IMPLANT
TRAY CATH LUMEN 1 20CM STRL (SET/KITS/TRAYS/PACK) IMPLANT
TUBE CONNECTING 20X1/4 (TUBING) ×1 IMPLANT
TUBE FEEDING 8FR 16IN STR KANG (MISCELLANEOUS) ×2 IMPLANT
WATER STERILE IRR 1000ML POUR (IV SOLUTION) ×4 IMPLANT
YANKAUER SUCT BULB TIP NO VENT (SUCTIONS) ×1 IMPLANT

## 2019-04-04 NOTE — Anesthesia Procedure Notes (Signed)
Arterial Line Insertion Start/End11/07/2018 7:10 AM, 04/04/2019 7:14 AM Performed by: Griffin Dakin, CRNA, CRNA  Patient location: Pre-op. Preanesthetic checklist: patient identified, IV checked, risks and benefits discussed, surgical consent, monitors and equipment checked, pre-op evaluation and timeout performed Lidocaine 1% used for infiltration and patient sedated Right, radial was placed Catheter size: 20 G Hand hygiene performed  and maximum sterile barriers used   Attempts: 1 Procedure performed without using ultrasound guided technique. Following insertion, dressing applied and Biopatch. Post procedure assessment: normal  Patient tolerated the procedure well with no immediate complications.

## 2019-04-04 NOTE — OR Nursing (Signed)
1324 2 HEART CHARGE NURSE CALLED AND GIVEN 20 MIN. ETA.

## 2019-04-04 NOTE — Progress Notes (Signed)
      [address]       [place] [address]             [phone]      S/p repair of type 1 dissection  Intubated, sedated BP 116/86   Pulse 86   Temp 99 F (37.2 C)   Resp 12   Ht 5\' 3"  (1.6 m)   Wt 58.9 kg   SpO2 100%   BMI 23.00 kg/m   32/16 CI= 2.3  Intake/Output Summary (Last 24 hours) at 04/04/2019 1923 Last data filed at 04/04/2019 1900 Gross per 24 hour  Intake 6899.47 ml  Output 1872 ml  Net 5027.47 ml   Ct output trending down  Hgb 7.4 - being transfused at present  CBG well controlled  Remo Lipps C. Roxan Hockey, MD [facility] [phone]

## 2019-04-04 NOTE — Progress Notes (Signed)
  Echocardiogram Echocardiogram Transesophageal has been performed.  Monique Henderson 04/04/2019, 9:01 AM

## 2019-04-04 NOTE — Anesthesia Procedure Notes (Signed)
Central Venous Catheter Insertion Performed by: Effie Berkshire, MD, anesthesiologist Start/End11/07/2018 6:55 AM, 04/04/2019 7:00 AM Patient location: Pre-op. Preanesthetic checklist: patient identified, IV checked, site marked, risks and benefits discussed, surgical consent, monitors and equipment checked, pre-op evaluation, timeout performed and anesthesia consent Hand hygiene performed  and maximum sterile barriers used  PA cath was placed.Swan type:thermodilution Procedure performed without using ultrasound guided technique. Attempts: 1 Patient tolerated the procedure well with no immediate complications.

## 2019-04-04 NOTE — ED Notes (Signed)
Titration goals: MAP less than 100 per Dr. Orvan Seen.

## 2019-04-04 NOTE — Anesthesia Procedure Notes (Signed)
Central Venous Catheter Insertion Performed by: Effie Berkshire, MD, anesthesiologist Start/End11/07/2018 6:45 AM, 04/04/2019 6:55 AM Patient location: Pre-op. Preanesthetic checklist: patient identified, IV checked, site marked, risks and benefits discussed, surgical consent, monitors and equipment checked, pre-op evaluation, timeout performed and anesthesia consent Position: Trendelenburg Lidocaine 1% used for infiltration and patient sedated Hand hygiene performed , maximum sterile barriers used  and Seldinger technique used Catheter size: 9 Fr Total catheter length 10. Central line was placed.MAC introducer Swan type:thermodilution PA Cath depth:50 Procedure performed using ultrasound guided technique. Ultrasound Notes:anatomy identified, needle tip was noted to be adjacent to the nerve/plexus identified, no ultrasound evidence of intravascular and/or intraneural injection and image(s) printed for medical record Attempts: 1 Following insertion, line sutured, dressing applied and Biopatch. Post procedure assessment: blood return through all ports, free fluid flow and no air  Patient tolerated the procedure well with no immediate complications.

## 2019-04-04 NOTE — Transfer of Care (Signed)
Immediate Anesthesia Transfer of Care Note  Patient: HONEY ZAKARIAN  Procedure(s) Performed: THORACIC ASCENDING ANEURYSM REPAIR (AAA)  USING 28 MM X 30 CM HEMASHIELD PLATINUM VASCULAR GRAFT (N/A ) Transesophageal Echocardiogram Darden Dates) (N/A )  Patient Location: ICU  Anesthesia Type:General  Level of Consciousness: sedated and Patient remains intubated per anesthesia plan  Airway & Oxygen Therapy: Patient remains intubated per anesthesia plan and Patient placed on Ventilator (see vital sign flow sheet for setting)  Post-op Assessment: Report given to RN and Post -op Vital signs reviewed and stable  Post vital signs: Reviewed and stable  Last Vitals:  Vitals Value Taken Time  BP 123/79 04/04/19 1411  Temp    Pulse 80 04/04/19 1413  Resp 20 04/04/19 1413  SpO2 100 % 04/04/19 1413  Vitals shown include unvalidated device data.  Last Pain:  Vitals:   04/04/19 0400  TempSrc:   PainSc: Asleep         Complications: No apparent anesthesia complications

## 2019-04-04 NOTE — Brief Op Note (Signed)
04/03/2019 - 04/04/2019  1:47 PM  PATIENT:  Monique Henderson  68 y.o. female  PRE-OPERATIVE DIAGNOSIS:  Type A Aneurysm Dissection  POST-OPERATIVE DIAGNOSIS:  Type A Aneurysm Dissection  PROCEDURE:  Procedure(s): THORACIC ASCENDING ANEURYSM REPAIR (AAA)  USING 28 MM X 30 CM HEMASHIELD PLATINUM VASCULAR GRAFT (N/A) Transesophageal Echocardiogram Darden Dates) (N/A)  SURGEON:  Surgeon(s) and Role:    * Wonda Olds, MD - Primary    * Grace Isaac, MD - Assisting  PHYSICIAN ASSISTANT: Ellwood Handler, PA-C  ASSISTANTS: staff   ANESTHESIA:   general  EBL:  800 mL   BLOOD ADMINISTERED:400 CC CELLSAVER  DRAINS: 2 Chest Tube(s) in the mediastinum and right pleural space   LOCAL MEDICATIONS USED:  NONE  SPECIMEN: aorta  DISPOSITION OF SPECIMEN:  PATHOLOGY  COUNTS:  YES  TOURNIQUET:  * No tourniquets in log *  DICTATION: .Note written in EPIC  PLAN OF CARE: Admit to inpatient   PATIENT DISPOSITION:  ICU - intubated and critically ill.   Delay start of Pharmacological VTE agent (>24hrs) due to surgical blood loss or risk of bleeding: yes  Jenasia Dolinar Z. Orvan Seen, [place]

## 2019-04-04 NOTE — Op Note (Signed)
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 Murvin Natal, MD  Assistant: Ceasar Mons MD  Anesthesia: get  Operative Findings:  preserved left ventricular systolic function  Normal tri-leaflet aortic valve  Ascending aortic hematoma with tear approximately 1 cm proximal to the great vessels    BRIEF CLINICAL NOTE AND INDICATIONS FOR SURGERY  68 yo lady presented with <1 day history of severe chest pain. Work-up suggested aortic dissection but not completely clear due to lack of intravenous contrast. Taken to OR for exploration and possible surgical repair.    DETAILS OF THE OPERATIVE PROCEDURE  Preparation:  The patient is brought to the operating room on the above mentioned date and central monitoring was established by the anesthesia team including placement of Swan-Ganz catheter and bilateral radial arterial lines. The patient is placed in the supine position on the operating table.  Intravenous antibiotics are administered. General endotracheal anesthesia is induced uneventfully. A Foley catheter is placed.  Baseline transesophageal echocardiogram was performed.  Findings were notable for trivial AI and preserved cardiac function. Moderate pericardial effusion without tamponade.   The patient's chest, abdomen, both groins, and both lower extremities are prepared and draped in a sterile manner. A time out procedure is performed.   Surgical Approach and  extracorporeal Cardiopulmonary Bypass:  An incision is made overlying the right infraclavicular fossa. The pectoralis major and minor muscles are divided. The right axillary artery is encircled; 5,000 units of heparin is adminstered intravenously. An 8 mm Hemashield graft is sewn to the opened right axillary artery with running 6-0 prolene. The graft is de-aired and attached to the arterial limb of the CPB machine.  Median sternotomy is undertaken. The pericardium is opened. The ascending aorta is serverely diseased with obvious hematoma but is not particularly aneurysmal.  Pericardial tacking sutures are placed. Full dose heparinization is achieved. The right atrium is cannulated for cardiopulmonary bypass.  Adequate heparinization is verified.   A retrograde cardioplegia cannula is placed through the right atrium into the coronary sinus.  The entire pre-bypass portion of the operation was notable for stable hemodynamics.  Cardiopulmonary bypass was begun and immediate cooling is undertaken to a goal bladder temp of 25 deg Celsius.  The aortic cross clamp is applied and cold blood cardioplegia is delivered initially in an retrograde fashion. The ascending aorta is incised and cardioplegia is given down the coronary ostia.    Iced saline slush is applied for topical hypothermia.  The initial cardioplegic arrest is rapid with early diastolic arrest.   Myocardial protection was felt to be excellent.   Replacement of ascending aorta: The proximal aorta is transected to the level of the STJ. Aortic valve sizers were used to estimate the aortic valve annulus diameter. A 28 mm Hemashield graft was brought onto the field and sewn end-to-end to the residual native aorta with running 3-0 Prolene. When this was completed, goal temperature had been achieved, and cooling had been occurring for 45 minutes. Circulatory arrest was instituted. The innominate artery and left carotid artery were clamped and antegrade  perfusion was performed while monitoring  cerebral saturations. A clear tear of the distal ascending was observed. The distal aorta was transected to the level underneath the arch. A 2nd portion of the 28 mm Hemashield graft was beveled and sewn to the underside of the arch with running 3-0 prolene. Once completed, the graft was de-aired and clamped, re-establishing flow to the body and head. Circulatory arrest time was 18 min with 17 min antegrade cerebral perfusion. Next, graft to graft anastomosis was performed with running 4-0 prolene. A hot shot dose of cardioplegia was delivered retrograde. Deairing procedures were performed and the aortic clamp was removed. Aggressive rewarming was undertaken.    Procedure Completion: Epicardial pacing wires are fixed to the right ventricular outflow tract and to the right atrial appendage. The patient is rewarmed to 36C temperature. The patient is weaned and disconnected from cardiopulmonary bypass.  The patient's rhythm at separation from bypass was sinus bradycardia.  The patient was weaned from cardiopulmonary bypass without any inotropic support. Followup transesophageal echocardiogram performed after separation from bypass revealed  no changes from the preoperative exam.  The axillary and venous cannula were removed uneventfully. Protamine was administered to reverse the anticoagulation. The mediastinum and right  pleural space were inspected for hemostasis and irrigated with saline solution. The mediastinum and right pleural space were drained using fluted chest tubes placed through separate stab incisions inferiorly.  The soft tissues anterior to the aorta were reapproximated loosely. The sternum is closed with double strength sternal wire. The soft tissues anterior to the sternum were closed in multiple layers and the skin is closed with a running subcuticular skin closure.  The post-bypass portion of the operation was notable for stable rhythm and  hemodynamics.    Disposition:  The patient tolerated the procedure well and is transported to the surgical intensive care in stable condition. There are no intraoperative complications. All sponge instrument and needle counts are verified correct at completion of the operation.    Jayme Cloud, MD 04/04/2019 5:06 PM

## 2019-04-04 NOTE — Anesthesia Postprocedure Evaluation (Signed)
Anesthesia Post Note  Patient: Monique Henderson  Procedure(s) Performed: THORACIC ASCENDING ANEURYSM REPAIR (AAA)  USING 28 MM X 30 CM HEMASHIELD PLATINUM VASCULAR GRAFT (N/A ) Transesophageal Echocardiogram (Tee) (N/A )     Patient location during evaluation: SICU Anesthesia Type: General Level of consciousness: sedated Pain management: pain level controlled Vital Signs Assessment: post-procedure vital signs reviewed and stable Respiratory status: patient remains intubated per anesthesia plan Cardiovascular status: stable Postop Assessment: no apparent nausea or vomiting Anesthetic complications: no    Last Vitals:  Vitals:   04/04/19 1610 04/04/19 1635  BP:    Pulse: 89 89  Resp: 12 12  Temp: (!) 35.8 C (!) 36.2 C  SpO2: 100% 100%    Last Pain:  Vitals:   04/04/19 0400  TempSrc:   PainSc: Asleep                 Virl Coble S

## 2019-04-04 NOTE — Anesthesia Procedure Notes (Signed)
Procedure Name: Intubation Date/Time: 04/04/2019 8:10 AM Performed by: Orlie Dakin, CRNA Pre-anesthesia Checklist: Patient identified, Emergency Drugs available, Suction available and Patient being monitored Patient Re-evaluated:Patient Re-evaluated prior to induction Oxygen Delivery Method: Circle system utilized Preoxygenation: Pre-oxygenation with 100% oxygen Induction Type: IV induction Ventilation: Mask ventilation without difficulty Laryngoscope Size: Mac and 4 Grade View: Grade I Tube type: Oral Tube size: 8.0 mm Number of attempts: 1 Airway Equipment and Method: Stylet Placement Confirmation: ETT inserted through vocal cords under direct vision,  positive ETCO2 and breath sounds checked- equal and bilateral Secured at: 23 cm Tube secured with: Tape Dental Injury: Teeth and Oropharynx as per pre-operative assessment

## 2019-04-04 NOTE — Anesthesia Preprocedure Evaluation (Addendum)
Anesthesia Evaluation  Patient identified by MRN, date of birth, ID band Patient awake    Reviewed: Allergy & Precautions, NPO status , Patient's Chart, lab work & pertinent test results  Airway Mallampati: II  TM Distance: >3 FB Neck ROM: Full    Dental no notable dental hx.    Pulmonary neg pulmonary ROS, Current Smoker,    Pulmonary exam normal breath sounds clear to auscultation       Cardiovascular hypertension, + Peripheral Vascular Disease  Normal cardiovascular exam Rhythm:Regular Rate:Normal     Neuro/Psych negative neurological ROS  negative psych ROS   GI/Hepatic negative GI ROS, Neg liver ROS,   Endo/Other  negative endocrine ROS  Renal/GU Renal disease  negative genitourinary   Musculoskeletal negative musculoskeletal ROS (+)   Abdominal   Peds negative pediatric ROS (+)  Hematology  (+) anemia ,   Anesthesia Other Findings   Reproductive/Obstetrics negative OB ROS                             Anesthesia Physical Anesthesia Plan  ASA: IV  Anesthesia Plan: General   Post-op Pain Management:    Induction: Intravenous  PONV Risk Score and Plan: 2  Airway Management Planned: Oral ETT  Additional Equipment: Arterial line, CVP, Ultrasound Guidance Line Placement and TEE  Intra-op Plan:   Post-operative Plan: Post-operative intubation/ventilation  Informed Consent: I have reviewed the patients History and Physical, chart, labs and discussed the procedure including the risks, benefits and alternatives for the proposed anesthesia with the patient or authorized representative who has indicated his/her understanding and acceptance.     Dental advisory given  Plan Discussed with: CRNA and Surgeon  Anesthesia Plan Comments:        Anesthesia Quick Evaluation

## 2019-04-04 NOTE — OR Nursing (Signed)
1210 2 HEART CHARGE NURSE CALLED AND GIVEN 45 MIN ETA.

## 2019-04-04 NOTE — OR Nursing (Signed)
0740 NO SKIN REACTION OR OTHERWISE NOTED FROM PLACEMENT OF BETADINE PAINT TO RIGHT UPPER CHEST.

## 2019-04-04 NOTE — Progress Notes (Signed)
ABG collected  

## 2019-04-04 NOTE — Progress Notes (Signed)
ABG results:  Ph: 7.39 Co2: 39.4 O2: 64 HCO3: 24.2 SaO2: 92%

## 2019-04-04 NOTE — Anesthesia Procedure Notes (Signed)
Arterial Line Insertion Start/End11/07/2018 6:40 AM, 04/04/2019 6:55 AM Performed by: Verdie Drown, CRNA, CRNA  Patient location: Pre-op. Preanesthetic checklist: patient identified, IV checked, risks and benefits discussed, surgical consent, monitors and equipment checked, pre-op evaluation and timeout performed Lidocaine 1% used for infiltration and patient sedated Left, radial was placed Catheter size: 20 G Hand hygiene performed  and maximum sterile barriers used   Attempts: 3 Procedure performed without using ultrasound guided technique. Following insertion, dressing applied and Biopatch. Post procedure assessment: normal  Patient tolerated the procedure well with no immediate complications.

## 2019-04-04 NOTE — OR Nursing (Signed)
0730 SMALL AMT OF BETADINE PAINT PLACED ON RIGHT UPPER CHEST TO DETERMINE REACTION D/T PT. HAVING SHRIMP ALLERGY.

## 2019-04-04 NOTE — H&P (Signed)
History and Physical Interval Note:  04/04/2019 7:33 AM  Alcus Dad  has presented today for surgery, with the diagnosis of Type A Aneurysm Dissection.  The various methods of treatment have been discussed with the patient and family. After consideration of risks, benefits and other options for treatment, the patient has consented to  Procedure(s): THORACIC ASCENDING ANEURYSM REPAIR (AAA) (N/A) as a surgical intervention.  The patient's history has been reviewed, patient examined, no change in status, stable for surgery.  I have reviewed the patient's chart and labs.  Questions were answered to the patient's satisfaction.     Monique Henderson

## 2019-04-05 ENCOUNTER — Encounter (HOSPITAL_COMMUNITY): Payer: Self-pay | Admitting: Cardiothoracic Surgery

## 2019-04-05 ENCOUNTER — Inpatient Hospital Stay (HOSPITAL_COMMUNITY): Payer: Medicare Other

## 2019-04-05 LAB — BPAM PLATELET PHERESIS
Blood Product Expiration Date: 202011042359
Blood Product Expiration Date: 202011052359
ISSUE DATE / TIME: 202011031212
ISSUE DATE / TIME: 202011031212
Unit Type and Rh: 6200
Unit Type and Rh: 6200

## 2019-04-05 LAB — POCT I-STAT 7, (LYTES, BLD GAS, ICA,H+H)
Acid-base deficit: 9 mmol/L — ABNORMAL HIGH (ref 0.0–2.0)
Acid-base deficit: 9 mmol/L — ABNORMAL HIGH (ref 0.0–2.0)
Acid-base deficit: 8 mmol/L — ABNORMAL HIGH (ref 0.0–2.0)
Acid-base deficit: 4 mmol/L — ABNORMAL HIGH (ref 0.0–2.0)
Acid-base deficit: 8 mmol/L — ABNORMAL HIGH (ref 0.0–2.0)
Acid-base deficit: 6 mmol/L — ABNORMAL HIGH (ref 0.0–2.0)
Bicarbonate: 17.5 mmol/L — ABNORMAL LOW (ref 20.0–28.0)
Bicarbonate: 16.3 mmol/L — ABNORMAL LOW (ref 20.0–28.0)
Bicarbonate: 16.2 mmol/L — ABNORMAL LOW (ref 20.0–28.0)
Bicarbonate: 21.3 mmol/L (ref 20.0–28.0)
Bicarbonate: 19 mmol/L — ABNORMAL LOW (ref 20.0–28.0)
Bicarbonate: 20.7 mmol/L (ref 20.0–28.0)
Calcium, Ion: 1.1 mmol/L — ABNORMAL LOW (ref 1.15–1.40)
Calcium, Ion: 1.08 mmol/L — ABNORMAL LOW (ref 1.15–1.40)
Calcium, Ion: 0.9 mmol/L — ABNORMAL LOW (ref 1.15–1.40)
Calcium, Ion: 1.06 mmol/L — ABNORMAL LOW (ref 1.15–1.40)
Calcium, Ion: 1.08 mmol/L — ABNORMAL LOW (ref 1.15–1.40)
Calcium, Ion: 1.06 mmol/L — ABNORMAL LOW (ref 1.15–1.40)
HCT: 24 % — ABNORMAL LOW (ref 36.0–46.0)
HCT: 22 % — ABNORMAL LOW (ref 36.0–46.0)
HCT: 18 % — ABNORMAL LOW (ref 36.0–46.0)
HCT: 23 % — ABNORMAL LOW (ref 36.0–46.0)
HCT: 26 % — ABNORMAL LOW (ref 36.0–46.0)
HCT: 22 % — ABNORMAL LOW (ref 36.0–46.0)
Hemoglobin: 8.2 g/dL — ABNORMAL LOW (ref 12.0–15.0)
Hemoglobin: 7.5 g/dL — ABNORMAL LOW (ref 12.0–15.0)
Hemoglobin: 6.1 g/dL — CL (ref 12.0–15.0)
Hemoglobin: 7.8 g/dL — ABNORMAL LOW (ref 12.0–15.0)
Hemoglobin: 8.8 g/dL — ABNORMAL LOW (ref 12.0–15.0)
Hemoglobin: 7.5 g/dL — ABNORMAL LOW (ref 12.0–15.0)
O2 Saturation: 94 %
O2 Saturation: 99 %
O2 Saturation: 98 %
O2 Saturation: 95 %
O2 Saturation: 86 %
O2 Saturation: 99 %
Patient temperature: 36.8
Patient temperature: 36.8
Patient temperature: 37
Patient temperature: 36.5
Patient temperature: 36.2
Patient temperature: 37
Potassium: 4.2 mmol/L (ref 3.5–5.1)
Potassium: 3.6 mmol/L (ref 3.5–5.1)
Potassium: 2.6 mmol/L — CL (ref 3.5–5.1)
Potassium: 3.4 mmol/L — ABNORMAL LOW (ref 3.5–5.1)
Potassium: 3.9 mmol/L (ref 3.5–5.1)
Potassium: 3.5 mmol/L (ref 3.5–5.1)
Sodium: 143 mmol/L (ref 135–145)
Sodium: 142 mmol/L (ref 135–145)
Sodium: 146 mmol/L — ABNORMAL HIGH (ref 135–145)
Sodium: 143 mmol/L (ref 135–145)
Sodium: 143 mmol/L (ref 135–145)
Sodium: 144 mmol/L (ref 135–145)
TCO2: 19 mmol/L — ABNORMAL LOW (ref 22–32)
TCO2: 17 mmol/L — ABNORMAL LOW (ref 22–32)
TCO2: 17 mmol/L — ABNORMAL LOW (ref 22–32)
TCO2: 22 mmol/L (ref 22–32)
TCO2: 20 mmol/L — ABNORMAL LOW (ref 22–32)
TCO2: 22 mmol/L (ref 22–32)
pCO2 arterial: 37.2 mmHg (ref 32.0–48.0)
pCO2 arterial: 32.1 mmHg (ref 32.0–48.0)
pCO2 arterial: 27 mmHg — ABNORMAL LOW (ref 32.0–48.0)
pCO2 arterial: 38.6 mmHg (ref 32.0–48.0)
pCO2 arterial: 46.4 mmHg (ref 32.0–48.0)
pCO2 arterial: 44.7 mmHg (ref 32.0–48.0)
pH, Arterial: 7.28 — ABNORMAL LOW (ref 7.350–7.450)
pH, Arterial: 7.313 — ABNORMAL LOW (ref 7.350–7.450)
pH, Arterial: 7.386 (ref 7.350–7.450)
pH, Arterial: 7.347 — ABNORMAL LOW (ref 7.350–7.450)
pH, Arterial: 7.216 — ABNORMAL LOW (ref 7.350–7.450)
pH, Arterial: 7.273 — ABNORMAL LOW (ref 7.350–7.450)
pO2, Arterial: 78 mmHg — ABNORMAL LOW (ref 83.0–108.0)
pO2, Arterial: 124 mmHg — ABNORMAL HIGH (ref 83.0–108.0)
pO2, Arterial: 104 mmHg (ref 83.0–108.0)
pO2, Arterial: 76 mmHg — ABNORMAL LOW (ref 83.0–108.0)
pO2, Arterial: 59 mmHg — ABNORMAL LOW (ref 83.0–108.0)
pO2, Arterial: 158 mmHg — ABNORMAL HIGH (ref 83.0–108.0)

## 2019-04-05 LAB — BASIC METABOLIC PANEL
Anion gap: 10 (ref 5–15)
Anion gap: 11 (ref 5–15)
Anion gap: 14 (ref 5–15)
BUN: 30 mg/dL — ABNORMAL HIGH (ref 8–23)
BUN: 29 mg/dL — ABNORMAL HIGH (ref 8–23)
BUN: 33 mg/dL — ABNORMAL HIGH (ref 8–23)
CO2: 18 mmol/L — ABNORMAL LOW (ref 22–32)
CO2: 23 mmol/L (ref 22–32)
CO2: 21 mmol/L — ABNORMAL LOW (ref 22–32)
Calcium: 7.6 mg/dL — ABNORMAL LOW (ref 8.9–10.3)
Calcium: 7.8 mg/dL — ABNORMAL LOW (ref 8.9–10.3)
Calcium: 7.8 mg/dL — ABNORMAL LOW (ref 8.9–10.3)
Chloride: 112 mmol/L — ABNORMAL HIGH (ref 98–111)
Chloride: 108 mmol/L (ref 98–111)
Chloride: 107 mmol/L (ref 98–111)
Creatinine, Ser: 3.16 mg/dL — ABNORMAL HIGH (ref 0.44–1.00)
Creatinine, Ser: 3.11 mg/dL — ABNORMAL HIGH (ref 0.44–1.00)
Creatinine, Ser: 3.54 mg/dL — ABNORMAL HIGH (ref 0.44–1.00)
GFR calc Af Amer: 17 mL/min — ABNORMAL LOW (ref >60)
GFR calc Af Amer: 17 mL/min — ABNORMAL LOW (ref >60)
GFR calc Af Amer: 15 mL/min — ABNORMAL LOW (ref >60)
GFR calc non Af Amer: 15 mL/min — ABNORMAL LOW (ref >60)
GFR calc non Af Amer: 15 mL/min — ABNORMAL LOW (ref >60)
GFR calc non Af Amer: 13 mL/min — ABNORMAL LOW (ref >60)
Glucose, Bld: 114 mg/dL — ABNORMAL HIGH (ref 70–99)
Glucose, Bld: 174 mg/dL — ABNORMAL HIGH (ref 70–99)
Glucose, Bld: 91 mg/dL (ref 70–99)
Potassium: 4.4 mmol/L (ref 3.5–5.1)
Potassium: 3.8 mmol/L (ref 3.5–5.1)
Potassium: 4.3 mmol/L (ref 3.5–5.1)
Sodium: 140 mmol/L (ref 135–145)
Sodium: 142 mmol/L (ref 135–145)
Sodium: 142 mmol/L (ref 135–145)

## 2019-04-05 LAB — MAGNESIUM
Magnesium: 2 mg/dL (ref 1.7–2.4)
Magnesium: 2.1 mg/dL (ref 1.7–2.4)

## 2019-04-05 LAB — CBC
HCT: 25.9 % — ABNORMAL LOW (ref 36.0–46.0)
HCT: 24.6 % — ABNORMAL LOW (ref 36.0–46.0)
HCT: 25.8 % — ABNORMAL LOW (ref 36.0–46.0)
Hemoglobin: 8.5 g/dL — ABNORMAL LOW (ref 12.0–15.0)
Hemoglobin: 8.3 g/dL — ABNORMAL LOW (ref 12.0–15.0)
Hemoglobin: 8.6 g/dL — ABNORMAL LOW (ref 12.0–15.0)
MCH: 29.5 pg (ref 26.0–34.0)
MCH: 29.4 pg (ref 26.0–34.0)
MCH: 29.8 pg (ref 26.0–34.0)
MCHC: 32.8 g/dL (ref 30.0–36.0)
MCHC: 33.7 g/dL (ref 30.0–36.0)
MCHC: 33.3 g/dL (ref 30.0–36.0)
MCV: 89.9 fL (ref 80.0–100.0)
MCV: 87.2 fL (ref 80.0–100.0)
MCV: 89.3 fL (ref 80.0–100.0)
Platelets: 129 10*3/uL — ABNORMAL LOW (ref 150–400)
Platelets: 116 10*3/uL — ABNORMAL LOW (ref 150–400)
Platelets: 116 10*3/uL — ABNORMAL LOW (ref 150–400)
RBC: 2.88 MIL/uL — ABNORMAL LOW (ref 3.87–5.11)
RBC: 2.82 MIL/uL — ABNORMAL LOW (ref 3.87–5.11)
RBC: 2.89 MIL/uL — ABNORMAL LOW (ref 3.87–5.11)
RDW: 16.7 % — ABNORMAL HIGH (ref 11.5–15.5)
RDW: 16.7 % — ABNORMAL HIGH (ref 11.5–15.5)
RDW: 17.2 % — ABNORMAL HIGH (ref 11.5–15.5)
WBC: 10.2 10*3/uL (ref 4.0–10.5)
WBC: 11.7 10*3/uL — ABNORMAL HIGH (ref 4.0–10.5)
WBC: 12.4 10*3/uL — ABNORMAL HIGH (ref 4.0–10.5)
nRBC: 0 % (ref 0.0–0.2)
nRBC: 0 % (ref 0.0–0.2)
nRBC: 0 % (ref 0.0–0.2)

## 2019-04-05 LAB — PREPARE PLATELET PHERESIS
Unit division: 0
Unit division: 0

## 2019-04-05 LAB — GLUCOSE, CAPILLARY
Glucose-Capillary: 95 mg/dL (ref 70–99)
Glucose-Capillary: 104 mg/dL — ABNORMAL HIGH (ref 70–99)
Glucose-Capillary: 90 mg/dL (ref 70–99)
Glucose-Capillary: 120 mg/dL — ABNORMAL HIGH (ref 70–99)
Glucose-Capillary: 126 mg/dL — ABNORMAL HIGH (ref 70–99)
Glucose-Capillary: 116 mg/dL — ABNORMAL HIGH (ref 70–99)
Glucose-Capillary: 119 mg/dL — ABNORMAL HIGH (ref 70–99)
Glucose-Capillary: 70 mg/dL (ref 70–99)
Glucose-Capillary: 153 mg/dL — ABNORMAL HIGH (ref 70–99)
Glucose-Capillary: 123 mg/dL — ABNORMAL HIGH (ref 70–99)
Glucose-Capillary: 159 mg/dL — ABNORMAL HIGH (ref 70–99)
Glucose-Capillary: 155 mg/dL — ABNORMAL HIGH (ref 70–99)
Glucose-Capillary: 123 mg/dL — ABNORMAL HIGH (ref 70–99)
Glucose-Capillary: 87 mg/dL (ref 70–99)
Glucose-Capillary: 89 mg/dL (ref 70–99)

## 2019-04-05 LAB — BPAM FFP
Blood Product Expiration Date: 202011042359
Blood Product Expiration Date: 202011042359
ISSUE DATE / TIME: 202011031212
ISSUE DATE / TIME: 202011031212
Unit Type and Rh: 600
Unit Type and Rh: 6200

## 2019-04-05 LAB — SURGICAL PATHOLOGY

## 2019-04-05 LAB — PREPARE FRESH FROZEN PLASMA
Unit division: 0
Unit division: 0

## 2019-04-05 LAB — PREPARE RBC (CROSSMATCH)

## 2019-04-05 LAB — HEMOGLOBIN AND HEMATOCRIT, BLOOD
HCT: 32.4 % — ABNORMAL LOW (ref 36.0–46.0)
Hemoglobin: 11.1 g/dL — ABNORMAL LOW (ref 12.0–15.0)

## 2019-04-05 MED ORDER — NITROPRUSSIDE SODIUM-NACL 10-0.9 MG/50ML-% IV SOLN
0.0000 ug/kg/min | INTRAVENOUS | Status: DC
  Filled 2019-04-05 (×2): qty 50

## 2019-04-05 MED ORDER — FUROSEMIDE 10 MG/ML IJ SOLN
40.0000 mg | Freq: Once | INTRAMUSCULAR | Status: AC
  Administered 2019-04-05: 40 mg via INTRAVENOUS
  Filled 2019-04-05: qty 4

## 2019-04-05 MED ORDER — FUROSEMIDE 10 MG/ML IJ SOLN
4.0000 mg/h | INTRAVENOUS | Status: DC
  Administered 2019-04-05: 17:00:00 4 mg/h via INTRAVENOUS
  Filled 2019-04-05: qty 25

## 2019-04-05 MED ORDER — HYDRALAZINE HCL 20 MG/ML IJ SOLN
10.0000 mg | Freq: Four times a day (QID) | INTRAMUSCULAR | Status: DC | PRN
  Administered 2019-04-06 – 2019-04-11 (×12): 10 mg via INTRAVENOUS
  Filled 2019-04-05 (×12): qty 1

## 2019-04-05 MED ORDER — AMIODARONE HCL IN DEXTROSE 360-4.14 MG/200ML-% IV SOLN
60.0000 mg/h | INTRAVENOUS | Status: DC
  Filled 2019-04-05: qty 600

## 2019-04-05 MED ORDER — AMIODARONE LOAD VIA INFUSION
150.0000 mg | Freq: Once | INTRAVENOUS | Status: AC
  Administered 2019-04-06: 150 mg via INTRAVENOUS
  Filled 2019-04-05: qty 83.34

## 2019-04-05 MED ORDER — "THROMBI-PAD 3""X3"" EX PADS"
1.0000 | MEDICATED_PAD | Freq: Once | CUTANEOUS | Status: AC
  Administered 2019-04-06: 1 via TOPICAL
  Filled 2019-04-05 (×2): qty 1

## 2019-04-05 MED ORDER — NITROPRUSSIDE SODIUM-NACL 20-0.9 MG/100ML-% IV SOLN
0.0000 ug/kg/min | INTRAVENOUS | Status: DC
  Administered 2019-04-05: 0.3 ug/kg/min via INTRAVENOUS
  Filled 2019-04-05: qty 100

## 2019-04-05 MED ORDER — SODIUM CHLORIDE 0.9% IV SOLUTION
Freq: Once | INTRAVENOUS | Status: AC
  Administered 2019-04-05: 16:00:00 via INTRAVENOUS

## 2019-04-05 MED ORDER — HYDRALAZINE HCL 20 MG/ML IJ SOLN
INTRAMUSCULAR | Status: AC
  Filled 2019-04-05: qty 1

## 2019-04-05 MED ORDER — SODIUM BICARBONATE 8.4 % IV SOLN
100.0000 meq | Freq: Once | INTRAVENOUS | Status: AC
  Administered 2019-04-05: 100 meq via INTRAVENOUS

## 2019-04-05 MED ORDER — AMIODARONE HCL IN DEXTROSE 360-4.14 MG/200ML-% IV SOLN: 30.0000 mg/h | INTRAVENOUS | Status: DC

## 2019-04-05 MED ORDER — FUROSEMIDE 10 MG/ML IJ SOLN
60.0000 mg | Freq: Once | INTRAMUSCULAR | Status: AC
  Administered 2019-04-05: 60 mg via INTRAVENOUS
  Filled 2019-04-05: qty 6

## 2019-04-05 MED ORDER — INSULIN ASPART 100 UNIT/ML ~~LOC~~ SOLN
0.0000 [IU] | SUBCUTANEOUS | Status: DC
  Administered 2019-04-05 – 2019-04-09 (×8): 2 [IU] via SUBCUTANEOUS

## 2019-04-05 NOTE — Addendum Note (Signed)
Addendum  created 04/05/19 0724 by Sammie Bench, CRNA   Order list changed

## 2019-04-05 NOTE — Progress Notes (Signed)
Pt started on SIMV rapid wean protocol at 1001. Pt able to follow commands and keep her eyes open. Pt tolerating new settings well at this time.

## 2019-04-05 NOTE — Progress Notes (Signed)
On pre-extubation blood gas hgb was 6.2 and potassium was 2.6. These values are markedly different from AM labs. CBC and BMET sent down to lab to confirm.    CT output since 7 AM only 120. No signs of bleeding. VSS. Will continue to monitor pt and notify MD if samples sent down to lab result low hgb and potassium.

## 2019-04-05 NOTE — Progress Notes (Addendum)
Since extubation the patient has become increasingly agitated and combative. She is attempting to get out of bed, hitting staff, using aggressive language and profanity, and is minimally redirectable. She is refusing to wear nasal cannula  despite O2 sat in the 70s. Multiple RN's have attempted to de-escalate and ask the patient what we can do to help her; she refuses care and continues to be combative. An order has been placed for a tele-sitter to ensure safety. Precedex was re-started at 0.1.    HR dropped to the 40s, pt was unresponsive to noxious stimulus and breathing was agonal. Pt was paced at 88, was bagged, RT and MD were notified. After 1-2 minutes, pt became responsive and was again agitated and combative, hitting staff and refusing to wear oxygen. A gas was obtained during this time which revealed a low PO2.    After several minutes, RN was able to get pt to be compliant with wearing O2. VSS. Will continue to monitor closely. Will allow the patient to rest and then obtain another gas to ensure values have improved. If not, will notify MD.

## 2019-04-05 NOTE — Procedures (Signed)
Extubation Procedure Note  Patient Details:   Name: JODEEN MCLIN DOB: Nov 25, 1950 MRN: [id number]   Airway Documentation:    Vent end date: 04/05/19 Vent end time: 1110   Evaluation  O2 sats: stable throughout Complications: No apparent complications Patient did tolerate procedure well. Bilateral Breath Sounds: Rhonchi   Yes   Pt extubated per MD order. Pt NIF -15 and VC 400 (MD made aware and wanted to proceed with extubation). Pt was suctioned prior and had a positive cuff leak before. Pt placed on 4L [place] with a saturation of 98%. Pt was able to say her name and where she was at after extubation. RT will continue to monitor pt status.   Jaykub Mackins A Ilay Capshaw 04/05/2019, 11:15 AM

## 2019-04-05 NOTE — Progress Notes (Signed)
Pt with poor urine output, MD aware. Started on Lasix drip, will continue to monitor.

## 2019-04-05 NOTE — Progress Notes (Signed)
Right radial arterial line removed by RN per verbal order. Manual pressure held for 10 minutes. Site is a level 0.

## 2019-04-05 NOTE — Progress Notes (Signed)
RN paged MD Roxan Hockey and notified him of the patient being hypertensive and having low urine output. MD gave orders for PRN hydralazine and nitroprusside drip.

## 2019-04-05 NOTE — Progress Notes (Signed)
  Amiodarone Drug - Drug Interaction Consult Note  Recommendations: Monitor K+, Zofran use  Amiodarone is metabolized by the cytochrome P450 system and therefore has the potential to cause many drug interactions. Amiodarone has an average plasma half-life of 50 days (range 20 to 100 days).   There is potential for drug interactions to occur several weeks or months after stopping treatment and the onset of drug interactions may be slow after initiating amiodarone.   []  Statins: Increased risk of myopathy. Simvastatin- restrict dose to 20mg  daily. Other statins: counsel patients to report any muscle pain or weakness immediately.  []  Anticoagulants: Amiodarone can increase anticoagulant effect. Consider warfarin dose reduction. Patients should be monitored closely and the dose of anticoagulant altered accordingly, remembering that amiodarone levels take several weeks to stabilize.  []  Antiepileptics: Amiodarone can increase plasma concentration of phenytoin, the dose should be reduced. Note that small changes in phenytoin dose can result in large changes in levels. Monitor patient and counsel on signs of toxicity.  [x]  Beta blockers: increased risk of bradycardia, AV block and myocardial depression. Sotalol - avoid concomitant use.  []   Calcium channel blockers (diltiazem and verapamil): increased risk of bradycardia, AV block and myocardial depression.  []   Cyclosporine: Amiodarone increases levels of cyclosporine. Reduced dose of cyclosporine is recommended.  []  Digoxin dose should be halved when amiodarone is started.  [x]  Diuretics: increased risk of cardiotoxicity if hypokalemia occurs.  []  Oral hypoglycemic agents (glyburide, glipizide, glimepiride): increased risk of hypoglycemia. Patient's glucose levels should be monitored closely when initiating amiodarone therapy.   [x]  Drugs that prolong the QT interval:  Torsades de pointes risk may be increased with concurrent use - avoid if  possible.  Monitor QTc, also keep magnesium/potassium WNL if concurrent therapy can't be avoided. Marland Kitchen Antibiotics: e.g. fluoroquinolones, erythromycin. . Antiarrhythmics: e.g. quinidine, procainamide, disopyramide, sotalol. . Antipsychotics: e.g. phenothiazines, haloperidol.  . Lithium, tricyclic antidepressants, and methadone. Thank You,  Narda Bonds  04/05/2019 10:55 PM

## 2019-04-05 NOTE — Progress Notes (Signed)
Patient wean was Terminated due to patient unable to follow commands specifically due to not being able to perform a hand squeeze, hold head off the pillow, and furthermore being able to understand and follow commands to perform pulmonary mechanics. Explained in detail regarding the understanding of performing the NIF/FVC, ask patient to nod for understanding, no nodding noted. Patient was not able to perform to me a good deep breath in consistent of a NIF.  Patient DOES NOT have an audible cuff leak witness by [facility], Therapist, sports. Patient was placed back on a Rate SIMV per protocol. Patient is presenting with wiggling and minimal thrashing around in the bed. Activity is consistent with withdrawals of some sort. Patient is stable at this time.

## 2019-04-05 NOTE — Progress Notes (Signed)
Day shift RN instructed by MD to titrate antihypertensives to MAP (<100) rather than SBP.

## 2019-04-05 NOTE — Discharge Instructions (Signed)

## 2019-04-05 NOTE — Addendum Note (Signed)
Addendum  created 04/05/19 1219 by Orlie Dakin, CRNA   Intraprocedure LDAs edited, LDA properties accepted

## 2019-04-05 NOTE — Progress Notes (Signed)
Pt NIF -15, VC 400. RN made aware, paged Dr. Orvan Seen about extubation. MD wants to proceed with extubation to nasal cannula, despite RT concerns.

## 2019-04-05 NOTE — Discharge Summary (Addendum)
Physician Discharge Summary       [address]       [place] [address]             [phone]    Patient ID: Monique Henderson MRN: [id number] DOB/AGE: [birth year] 68 y.o.  Admit date: 04/03/2019 Discharge date: 04/23/2019  Admission Diagnoses: Dissection of aorta [facility])  Discharge Diagnoses:  1. 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 Post op delirium 3. History of hypertension 4. History of tobacco abuse 5. History of Breast cancer and radiation(HCC) 6. History of Chronic kidney disease, stage IV (severe) ([place]) 7. History of GERD (gastroesophageal reflux disease) 8. History of Hepatitis C 9. History of low back pain 10. History of lumbar spinal stenosis 11. History of bone spur  Consults: Nephrology and Vascular surgery  Procedure (s):   Replacement of ascending aorta with proximal supracoronary graft implantation (28 mm Hemashield Platinum) and distal hemiarch reconstruction under hypothermic circulatory arrest (18 min) with antegrade cerebral perfusion (17 min)  Right axillary artery cannulation for cardiopulmonary bypass  TEE by Dr. Orvan Seen on 04/04/2019.    1.  Ultrasound-guided placement of 19 cm right IJ tunneled dialysis catheter 2.  Left arm brachial artery to cephalic vein AV fistula creation by Dr. Donzetta Matters on 04/20/2019.         History of Presenting Illness: This is a 68 yo lady with h/o CKD, HTN, and breast CA (in remission) presents with several hour h/o chest pain, which she had never experienced previously. This began at approximately 1700 hr on 04/03/19 while cooking dinner and the pain was sudden and sharp. When there was no relief, she called EMS. 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 Now, hemodynamically stable, some residual pain, not as severe, but it has not completely abated. In summary, this is a 68  yo lady with acute chest pain, agree with strong likelihood of Type A dissection, despite noncontrasted CT. Plan is to admit to ICU for pain control and impulse control overnight. It was discussed about the need for urgent operation 1st thing in AM. She understands the risk of waiting for surgery as well as risk of the surgery for dissection in general, the most extreme of which for her is renal failure requiring HD. She wishes to proceed. She underwent replacement of ascending aorta with proximal supracoronary graft implantation and distal hemiarch reconstruction with right axillary artery cannulation on 04/04/2019.  Brief Hospital Course:  The patient was extubated the evening of surgery without difficulty. She remained afebrile and hemodynamically stable. She was agitated post op and in restraints. Gordy Councilman, a line, chest tubes, and foley were removed early in the post operative course. Lopressor was started and titrated accordingly. She went into a fib and was put on Amiodarone. Patient with a history of CKD (stage IV). Her creatinine worsened post op (creatinine upon admission was 3.36) so nephrology was consulted. It was felt likely etiology was from  hypovolemia/hypotension in the setting of chronic HTN. Nephrology managed her diuretic regimen and arranged for HD. She had anemia.  Speech pathology was consulted to assess aspiration risk and their recommendations were followed accordingly. Her respiratory status worsened and she arrested the afternoon of 11/05. ROSC, she was started on Levophed drip, and put on bipap. She was weaned off the insulin drip. The patient's glucose remained well controlled.The patient's HGA1C pre op was 6. She likely  has pre diabetes and will need medical follow up after discharge. She was started on a Catapres patch for hypertension. The patient was felt surgically stable for transfer from the ICU to [facility]U for further convalescence on 04/12/2019.    It was felt she would be  unable to be weaned off dialysis.  Vascular surgery consult was obtained for placement of permanent dialysis access.  However, this could not be done until her leukocytosis was resolved.  Her WBC went as high as 20,600.  She was placed on Zosyn for suspected left pulmonary infiltrate.  Blood cultures were obtained and were negative. Urinalysis was also obtained and showed trace leukocytes and rare bacteria.She did NOT have a wound infection. Her WBC gradually decreased and was down to 12,100 on 11/20. Her temporary HD catheter on the left was removed on 11/20 . She developed Atrial Fibrillation and Atrial Flutter.  She underwent rapid Atrial pacing with conversion to NSR and then immediately converted back into A. Flutter.  She was treated with Amiodarone bolus and drip. She converted and remained in sinus rhythm, sinus brady in the high 50's at times.She continues to progress with cardiac rehab. She was ambulating on room air. She has been tolerating a diet and has had a bowel movement. Last H and H was 8.7 and 27. 6. Epicardial pacing wires were removed 11/17.Vascular surgery was consulted for a [facility] and more permanent HD access. She underwent a left TDC and left arm brachial artery to cephalic vein AV fistula creation on 11/19. Assistance with arrange HD as an outpatient has been arranged.  Chest tube sutures will be removed today 11/21. Patient prior to being discharged requested a prescription for Zofran PRN, which was sent to her pharmacy. As discussed with Dr. Orvan Seen previously, the patient is felt surgically stable for discharge today;however, in the afternoon, patient had complaints of nausea and dizziness so discharge was held. She feels well this am (11/22) and denies nausea or dizziness. She states she wants to go home. Will discharge home.   Latest Vital Signs: Blood pressure (!) 129/59, pulse 68, temperature 98.7 F (37.1 C), temperature source Oral, resp. rate 19, height 5\' 3"  (1.6 m), weight 45.1  kg, SpO2 96 %.  Physical Exam: Cardiovascular: RRR Pulmonary: Slightly diminished left breath sounds Abdomen: Soft, non tender, bowel sounds present. Extremities: No lower extremity edema. Wounds: Sternal and axillary wounds are clean and dry.  No erythema or signs of infection. Left catheter wound is mostly clean and dry. Left arm wound has derma bond at brachial area.  Discharge Condition: Stable and discharged to home.  Recent laboratory studies:  Lab Results  Component Value Date   WBC 12.1 (H) 04/21/2019   HGB 8.7 (L) 04/21/2019   HCT 27.6 (L) 04/21/2019   MCV 93.6 04/21/2019   PLT 564 (H) 04/21/2019   Lab Results  Component Value Date   NA 135 04/21/2019   K 4.1 04/21/2019   CL 99 04/21/2019   CO2 25 04/21/2019   CREATININE 3.55 (H) 04/21/2019   GLUCOSE 112 (H) 04/21/2019      Diagnostic Studies: Dg Chest 1 View  Result Date: 04/09/2019 CLINICAL DATA:  Over are surgery. EXAM: CHEST  1 VIEW COMPARISON:  04/08/2019, 04/07/2019, 04/06/2019 FINDINGS: Right jugular sheath in satisfactory unchanged position. Left-sided subclavian central venous catheter with the tip projecting over the upper SVC. Right-sided chest tube in unchanged position. No pneumothorax. No pleural effusion. Bilateral mild interstitial thickening and patchy alveolar airspace opacities. Stable cardiomegaly.  Prior CABG. No acute osseous abnormality. IMPRESSION: 1. Bilateral interstitial and patchy alveolar airspace opacities consistent with improving pulmonary edema. 2. Right-sided chest tube without a pneumothorax. 3. Right jugular sheath in satisfactory unchanged position. Left-sided subclavian central venous catheter with the tip projecting over the upper SVC. Electronically Signed   By: Kathreen Devoid   On: 04/09/2019 14:26   Dg Chest 2 View  Result Date: 04/13/2019 CLINICAL DATA:  History of open heart surgery. EXAM: CHEST - 2 VIEW COMPARISON:  04/10/2019. FINDINGS: Interval removal of right IJ sheath. Left  subclavian dual lumen catheter noted in stable position. Prior median sternotomy. Cardiomegaly. No pulmonary venous congestion. Bibasilar and infiltrates/edema. Small bilateral pleural effusions. No pneumothorax. IMPRESSION: 1. Interim removal of right IJ sheath. Left subclavian catheter in stable position. 2. Prior median sternotomy. Stable cardiomegaly. No pulmonary venous congestion. 3. Bibasilar atelectasis. Bibasilar infiltrates and/or edema with small bilateral pleural effusions. Electronically Signed   By: Marcello Moores  Register   On: 04/13/2019 07:56   Dg Chest 2 View  Result Date: 04/03/2019 CLINICAL DATA:  Mid chest pain and shortness of breath, sudden onset, fall. EXAM: CHEST - 2 VIEW COMPARISON:  12/22/2017 FINDINGS: Mild enlargement of the cardiopericardial silhouette, without edema. Moderate thoracic spondylosis. The lungs appear clear. No blunting of the costophrenic angles. Questionable irregularity of the right tenth rib laterally, correlate with any point tenderness. IMPRESSION: 1. Questionable irregularity of the right tenth rib laterally, correlate with any point tenderness. 2. Mild enlargement of the cardiopericardial silhouette, without edema. 3. Moderate thoracic spondylosis. Electronically Signed   By: Van Clines M.D.   On: 04/03/2019 19:11   Ct Chest Wo Contrast  Result Date: 04/03/2019 CLINICAL DATA:  Flank pain.  Bradycardia. EXAM: CT CHEST, ABDOMEN AND PELVIS WITHOUT CONTRAST TECHNIQUE: Multidetector CT imaging of the chest, abdomen and pelvis was performed following the standard protocol without IV contrast. COMPARISON:  None. FINDINGS: CT CHEST FINDINGS Cardiovascular: There is a crescentic aortic wall hyperdensity from the aortic root extending through the descending thoracic aorta. Ascending aorta measures approximately 3.7 cm in diameter. Aortic calcifications are noted. The heart size is normal. There is a small relatively low density pericardial effusion. There are likely  some small volume blood products within the mediastinum as evidence by fat stranding in the upper paratracheal region. Mediastinum/Nodes: --No mediastinal or hilar lymphadenopathy. --No axillary lymphadenopathy. --No supraclavicular lymphadenopathy. --Normal thyroid gland. --The esophagus is unremarkable Lungs/Pleura: There is no pneumothorax. There is scarring versus atelectasis at the lung bases bilaterally, right worse than left. There is no significant pleural effusion. The trachea is unremarkable. Musculoskeletal: No chest wall abnormality. No acute or significant osseous findings. CT ABDOMEN PELVIS FINDINGS Hepatobiliary: The liver is normal. Normal gallbladder.There is no biliary ductal dilation. Pancreas: Normal contours without ductal dilatation. No peripancreatic fluid collection. Spleen: No splenic laceration or hematoma. Adrenals/Urinary Tract: --Adrenal glands: No adrenal hemorrhage. --Right kidney/ureter: There is a hyperdense nodule right arising from the upper pole measuring approximately 1.1 cm. There is no hydronephrosis. --Left kidney/ureter: Multiple tiny hyperdense nodules are noted in the upper pole. There is a punctate nonobstructing stone in the lower pole. --Urinary bladder: Unremarkable. Stomach/Bowel: --Stomach/Duodenum: No hiatal hernia or other gastric abnormality. Normal duodenal course and caliber. --Small bowel: No dilatation or inflammation. --Colon: No focal abnormality. --Appendix: Normal. Vascular/Lymphatic: Atherosclerotic calcification is present within the non-aneurysmal abdominal aorta, without hemodynamically significant stenosis. --No retroperitoneal lymphadenopathy. --No mesenteric lymphadenopathy. --No pelvic or inguinal lymphadenopathy. Reproductive: Multiple fibroids are suspected within the uterus. Other: No  ascites or free air. The abdominal wall is normal. Musculoskeletal. No acute displaced fractures. IMPRESSION: 1. Evaluation limited by lack of IV contrast. 2.  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 Follow-up with a CTA is recommended. Surgical consultation is recommended. 3. Small relatively hypodense pericardial effusion. 4. No acute intra-abdominal abnormality. 5. Indeterminate hyperdense bilateral renal nodules. Follow-up with a nonemergent outpatient renal ultrasound is recommended for further evaluation. 6. Fibroid uterus suspected. These results were called by telephone at the time of interpretation on 04/03/2019 at 10:33 pm to provider DAVID YAO , who verbally acknowledged these results. Electronically Signed   By: Constance Holster M.D.   On: 04/03/2019 22:37   US Renal  Result Date: 04/07/2019 CLINICAL DATA:  Chronic renal disease. EXAM: RENAL / URINARY TRACT ULTRASOUND COMPLETE COMPARISON:  CT 04/03/2019. FINDINGS: Right Kidney: Renal measurements: 9.7 x 4.5 x 5.0 cm = volume: 113.3 mL. Renal cortical thinning. Increased echogenicity. No mass or hydronephrosis visualized. Left Kidney: Renal measurements: 9.5 x 5.9 x 4.6 cm = volume: 136.3 mL. Renal cortical thinning. Increased echogenicity. No mass or hydronephrosis visualized. Bladder: Not visualized.  No bladder distention. Other: Small amount of ascites.  Left pleural effusion incidentally noted. IMPRESSION: 1. Bilateral renal cortical thinning. Bilateral renal increased echogenicity consistent chronic medical renal disease. Small amount of right renal perinephric fluid. No focal renal abnormality identified. No hydronephrosis. 2.  Bladder not visualized.  No bladder distention noted. 3. Small amount of ascites. Left pleural effusion incidentally noted. Electronically Signed   By: Marcello Moores  Register   On: 04/07/2019 15:51   Dg Chest Port 1 View  Result Date: 04/16/2019 CLINICAL DATA:  Re-evaluate lung infiltrates EXAM: PORTABLE CHEST 1 VIEW COMPARISON:  04/13/2019, 04/10/2019, 04/09/2019, 04/08/2019  FINDINGS: Post sternotomy changes. Left-sided central venous catheter with tip projecting over venous confluence. Decreased right pleural effusion. Similar small left effusion and left basilar and lingular airspace disease. Stable cardiomegaly. No pneumothorax. IMPRESSION: 1. Slightly improved aeration of right base with decreased right pleural effusion 2. No significant change in small left effusion and left basilar airspace disease. 3. No change in cardiomegaly Electronically Signed   By: Donavan Foil M.D.   On: 04/16/2019 16:16   Dg Chest Port 1 View  Result Date: 04/10/2019 CLINICAL DATA:  Shortness of breath. EXAM: PORTABLE CHEST 1 VIEW COMPARISON:  April 09, 2019. FINDINGS: Stable cardiomegaly. Right internal jugular and left subclavian catheters are unchanged. No pneumothorax is noted. Mild bibasilar opacities are noted concerning for atelectasis or infiltrates. Probable left pleural effusion is noted. Bony thorax is unremarkable. IMPRESSION: Stable support apparatus. Increased bibasilar opacities concerning for atelectasis or infiltrate. Electronically Signed   By: Marijo Conception M.D.   On: 04/10/2019 07:41   Dg Chest Port 1 View  Result Date: 04/08/2019 CLINICAL DATA:  Hx open heart surgery 04/04/19 - THORACIC ASCENDING ANEURYSM REPAIR ([facility]) EXAM: PORTABLE CHEST - 1 VIEW COMPARISON:  04/07/2019 FINDINGS: Right IJ venous sheath, left subclavian dialysis catheter stable in position. Stable right chest tube without pneumothorax. Slight worsening of patchy perihilar airspace opacities with peripheral sparing. Improved aeration at the lung bases however. Heart size upper limits normal for technique. Aortic Atherosclerosis (ICD10-170.0). No definite effusion. Sternotomy wires. Surgical clips right upper abdomen. IMPRESSION: 1. Stable right chest tube with no pneumothorax. 2. Slight worsening of perihilar airspace opacities. Electronically Signed   By: Lucrezia Europe M.D.   On: 04/08/2019 11:08   Dg  Chest  Port 1 View  Result Date: 04/07/2019 CLINICAL DATA:  Open-heart surgery. EXAM: PORTABLE CHEST 1 VIEW COMPARISON:  04/06/2019 and CT chest 04/03/2019. FINDINGS: Right IJ catheter sheath remains in place. Left subclavian central line terminates at the brachiocephalic vein junction. Medial right chest tube. Cardiomediastinal silhouette is enlarged, stable. Thoracic aorta is calcified. Mixed interstitial and airspace opacification bilaterally with bibasilar collapse/consolidation and bilateral pleural effusions. No definite pneumothorax. IMPRESSION: Probable congestive heart failure with bibasilar collapse/consolidation. Pneumonia is not excluded. Electronically Signed   By: Lorin Picket M.D.   On: 04/07/2019 08:44   Dg Chest Port 1 View  Result Date: 04/06/2019 CLINICAL DATA:  Central line placement. Pt unable to answer hx questions. EXAM: PORTABLE CHEST 1 VIEW COMPARISON:  04/06/2019 FINDINGS: LEFT-sided subclavian central line has been placed, tip overlying the level of the superior vena cava. RIGHT IJ sheath remains in place. No pneumothorax. Status post median sternotomy. Mediastinal drains remain in place. There is dense opacity at both lung bases partially obscuring the hemidiaphragms. Bilateral pleural effusions are present and appear stable. IMPRESSION: 1. Interval placement of left subclavian central line. No pneumothorax. 2. Stable bilateral pleural effusions and bibasilar opacities. Electronically Signed   By: Nolon Nations M.D.   On: 04/06/2019 17:25   Dg Chest Port 1 View  Result Date: 04/06/2019 CLINICAL DATA:  68 year old female status post recent cardiac surgery. EXAM: PORTABLE CHEST 1 VIEW COMPARISON:  Chest radiograph 04/05/2019 FINDINGS: There has been interval removal of the endotracheal, and enteric tubes as well as removal of the Swan-Ganz catheter. A right-sided chest tube remains in similar position. There is a right IJ central venous line with tip over upper SVC. There is  shallow inspiration. Small bilateral pleural effusions and associated atelectasis of the adjacent lung bases. Focal area of increased density at the right lung base peripherally may represent atelectasis although pneumonia is not excluded. Clinical correlation is recommended. There is no pneumothorax. Stable cardiomediastinal silhouette. Median sternotomy wires. No acute osseous pathology. IMPRESSION: 1. Interval removal of the endotracheal and enteric tubes, as well as the Swan-Ganz catheter. Right IJ central venous line with tip over upper SVC. No pneumothorax. 2. Small bilateral pleural effusions and associated atelectasis. 3. Focal area of increased density at the right lung base may represent atelectasis although pneumonia is not excluded. Electronically Signed   By: Anner Crete M.D.   On: 04/06/2019 08:43   Dg Chest Port 1 View  Result Date: 04/05/2019 CLINICAL DATA:  Chest tube placement EXAM: PORTABLE CHEST 1 VIEW COMPARISON:  April 04, 2019 FINDINGS: The endotracheal tube terminates above the carina. The Swan-Ganz catheter tip projects over the main pulmonary artery. Chest tubes and mediastinal drains are noted. There are small to moderate-sized bilateral pleural effusions which have increased in size from prior study. There is no pneumothorax. Vascular congestion is noted. Bibasilar atelectasis is noted. There is some subcutaneous gas in the low right neck and right chest wall. IMPRESSION: 1. Lines and tubes as above. 2. Growing bilateral pleural effusions. 3. Persistent atelectasis at the lung bases. 4. No pneumothorax. Electronically Signed   By: Constance Holster M.D.   On: 04/05/2019 06:04   Dg Chest Port 1 View  Result Date: 04/04/2019 CLINICAL DATA:  Status post open heart surgery. EXAM: PORTABLE CHEST 1 VIEW COMPARISON:  Chest x-ray dated 04/03/2019. FINDINGS: Endotracheal tube is well positioned with tip just above the level of the carina. Swan-Ganz catheter in place with tip just  to the LEFT of midline.  Enteric tube passes below the diaphragm. Mediastinal drains in place. Probable mild bibasilar atelectasis and/or small pleural effusions. No pneumothorax seen. IMPRESSION: 1. Support apparatus appears appropriately position. 2. Probable mild bibasilar atelectasis and/or small pleural effusions. Electronically Signed   By: Franki Cabot M.D.   On: 04/04/2019 14:42   Dg Chest Port 1v Same Day  Result Date: 04/20/2019 CLINICAL DATA:  Postop EXAM: PORTABLE CHEST 1 VIEW COMPARISON:  Radiograph 04/16/2019 FINDINGS: There is a right IJ approach dual lumen catheter with the tip near the superior cavoatrial junction. A left subclavian approach dual lumen catheter terminates at the brachiocephalic-caval confluence. Median sternotomy wires remain intact and aligned. There is been slight interval increase in the size of the cardiac silhouette with increasing left basilar opacity and left pleural effusion though some of this change may be related to differences in positioning from prior portable exam. Bandlike areas of opacity likely reflect atelectasis. Surgical clips noted in right chest wall soft tissues. No acute osseous or soft tissue abnormality. Degenerative changes are present in the imaged spine and shoulders. IMPRESSION: 1. Increasing left basilar opacity and left pleural effusion. 2. The right IJ approach dual lumen catheter terminates near the superior cavoatrial junction. 3. Left subclavian approach dual lumen catheter at the brachiocephalic-caval confluence, stable positioning Electronically Signed   By: Lovena Le M.D.   On: 04/20/2019 16:42   Dg Fluoro Guide Cv Line-no Report  Result Date: 04/20/2019 Fluoroscopy was utilized by the requesting physician.  No radiographic interpretation.   Ct Renal Stone Study  Result Date: 04/03/2019 CLINICAL DATA:  Flank pain.  Bradycardia. EXAM: CT CHEST, ABDOMEN AND PELVIS WITHOUT CONTRAST TECHNIQUE: Multidetector CT imaging of the chest,  abdomen and pelvis was performed following the standard protocol without IV contrast. COMPARISON:  None. FINDINGS: CT CHEST FINDINGS Cardiovascular: There is a crescentic aortic wall hyperdensity from the aortic root extending through the descending thoracic aorta. Ascending aorta measures approximately 3.7 cm in diameter. Aortic calcifications are noted. The heart size is normal. There is a small relatively low density pericardial effusion. There are likely some small volume blood products within the mediastinum as evidence by fat stranding in the upper paratracheal region. Mediastinum/Nodes: --No mediastinal or hilar lymphadenopathy. --No axillary lymphadenopathy. --No supraclavicular lymphadenopathy. --Normal thyroid gland. --The esophagus is unremarkable Lungs/Pleura: There is no pneumothorax. There is scarring versus atelectasis at the lung bases bilaterally, right worse than left. There is no significant pleural effusion. The trachea is unremarkable. Musculoskeletal: No chest wall abnormality. No acute or significant osseous findings. CT ABDOMEN PELVIS FINDINGS Hepatobiliary: The liver is normal. Normal gallbladder.There is no biliary ductal dilation. Pancreas: Normal contours without ductal dilatation. No peripancreatic fluid collection. Spleen: No splenic laceration or hematoma. Adrenals/Urinary Tract: --Adrenal glands: No adrenal hemorrhage. --Right kidney/ureter: There is a hyperdense nodule right arising from the upper pole measuring approximately 1.1 cm. There is no hydronephrosis. --Left kidney/ureter: Multiple tiny hyperdense nodules are noted in the upper pole. There is a punctate nonobstructing stone in the lower pole. --Urinary bladder: Unremarkable. Stomach/Bowel: --Stomach/Duodenum: No hiatal hernia or other gastric abnormality. Normal duodenal course and caliber. --Small bowel: No dilatation or inflammation. --Colon: No focal abnormality. --Appendix: Normal. Vascular/Lymphatic: Atherosclerotic  calcification is present within the non-aneurysmal abdominal aorta, without hemodynamically significant stenosis. --No retroperitoneal lymphadenopathy. --No mesenteric lymphadenopathy. --No pelvic or inguinal lymphadenopathy. Reproductive: Multiple fibroids are suspected within the uterus. Other: No ascites or free air. The abdominal wall is normal. Musculoskeletal. No acute displaced fractures. IMPRESSION: 1. Evaluation limited by  lack of IV contrast. 2. 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 Follow-up with a CTA is recommended. Surgical consultation is recommended. 3. Small relatively hypodense pericardial effusion. 4. No acute intra-abdominal abnormality. 5. Indeterminate hyperdense bilateral renal nodules. Follow-up with a nonemergent outpatient renal ultrasound is recommended for further evaluation. 6. Fibroid uterus suspected. These results were called by telephone at the time of interpretation on 04/03/2019 at 10:33 pm to provider DAVID YAO , who verbally acknowledged these results. Electronically Signed   By: Constance Holster M.D.   On: 04/03/2019 22:37   Vas [place] Upper Ext Vein Mapping (pre-op Avf)  Result Date: 04/15/2019 UPPER EXTREMITY VEIN MAPPING  Indications: Pre-access. Comparison Study: No prior studies. Performing Technologist: Oliver Hum RVT  Examination Guidelines: A complete evaluation includes B-mode imaging, spectral Doppler, color Doppler, and power Doppler as needed of all accessible portions of each vessel. Bilateral testing is considered an integral part of a complete examination. Limited examinations for reoccurring indications may be performed as noted. +-----------------+-------------+----------+---------+  Right Cephalic    Diameter (cm) Depth (cm) Findings   +-----------------+-------------+----------+---------+  Shoulder              0.26         0.52                +-----------------+-------------+----------+---------+  Prox upper arm        0.26         0.40               +-----------------+-------------+----------+---------+  Mid upper arm         0.20         0.24               +-----------------+-------------+----------+---------+  Dist upper arm        0.22         0.27               +-----------------+-------------+----------+---------+  Antecubital fossa     0.17         0.36    branching  +-----------------+-------------+----------+---------+  Prox forearm          0.34         0.18    Thrombus   +-----------------+-------------+----------+---------+  Mid forearm           0.26         0.21    Thrombus   +-----------------+-------------+----------+---------+  Dist forearm          0.30         0.19    Thrombus   +-----------------+-------------+----------+---------+ +-----------------+-------------+----------+--------+  Right Basilic     Diameter (cm) Depth (cm) Findings  +-----------------+-------------+----------+--------+  Shoulder              0.32         0.34              +-----------------+-------------+----------+--------+  Mid upper arm         0.27         0.66              +-----------------+-------------+----------+--------+  Dist upper arm        0.24         0.54              +-----------------+-------------+----------+--------+  Antecubital fossa     0.18  0.29              +-----------------+-------------+----------+--------+  Prox forearm          0.13         0.25              +-----------------+-------------+----------+--------+  Mid forearm           0.09         0.18              +-----------------+-------------+----------+--------+  Distal forearm        0.11         0.18              +-----------------+-------------+----------+--------+ +-----------------+-------------+----------+--------+  Left Cephalic     Diameter (cm) Depth (cm) Findings  +-----------------+-------------+----------+--------+  Shoulder              0.31         0.49               +-----------------+-------------+----------+--------+  Prox upper arm        0.23         0.43              +-----------------+-------------+----------+--------+  Mid upper arm         0.17         0.33              +-----------------+-------------+----------+--------+  Dist upper arm        0.20         0.30              +-----------------+-------------+----------+--------+  Antecubital fossa     0.23         0.32              +-----------------+-------------+----------+--------+  Prox forearm          0.21         0.30              +-----------------+-------------+----------+--------+  Mid forearm           0.09         0.19              +-----------------+-------------+----------+--------+  Dist forearm          0.06         0.13              +-----------------+-------------+----------+--------+ +-----------------+-------------+----------+---------+  Left Basilic      Diameter (cm) Depth (cm) Findings   +-----------------+-------------+----------+---------+  Shoulder              0.38         0.62               +-----------------+-------------+----------+---------+  Prox upper arm        0.33         0.59               +-----------------+-------------+----------+---------+  Mid upper arm         0.31         0.59               +-----------------+-------------+----------+---------+  Dist upper arm        0.22         0.49               +-----------------+-------------+----------+---------+  Antecubital fossa     0.24  0.42    branching  +-----------------+-------------+----------+---------+  Prox forearm          0.18         0.26    branching  +-----------------+-------------+----------+---------+  Mid forearm           0.11         0.23               +-----------------+-------------+----------+---------+  Distal forearm        0.08         0.17               +-----------------+-------------+----------+---------+ *See table(s) above for measurements and observations.  Diagnosing physician:  Monica Martinez MD Electronically signed by Monica Martinez MD on 04/15/2019 at 10:33:54 AM.    Final        Discharge Instructions    AMB Referral to Cardiac Rehabilitation - Phase II   Complete by: As directed    Diagnosis: Other   After initial evaluation and assessments completed: Virtual Based Care may be provided alone or in conjunction with Phase 2 Cardiac Rehab based on patient barriers.: Yes   Amb Referral to Cardiac Rehabilitation   Complete by: As directed    Diagnosis: Valve Replacement   Valve: Aortic   After initial evaluation and assessments completed: Virtual Based Care may be provided alone or in conjunction with Phase 2 Cardiac Rehab based on patient barriers.: Yes      Discharge Medications: Allergies as of 04/23/2019      Reactions   Shrimp [shellfish Allergy] Shortness Of Breath   Bactroban [mupirocin] Other (See Comments)   "Sores in nose"   Vicodin [hydrocodone-acetaminophen] Itching, Nausea And Vomiting   This is patient's home medication   Lisinopril Cough   Tylenol [acetaminophen] Itching      Medication List    STOP taking these medications   carvedilol 12.5 MG tablet Commonly known as: COREG   cloNIDine 0.2 MG tablet Commonly known as: CATAPRES   oxyCODONE-acetaminophen 5-325 MG tablet Commonly known as: Percocet     TAKE these medications   albuterol 108 (90 Base) MCG/ACT inhaler Commonly known as: VENTOLIN HFA Inhale 2 puffs into the lungs every 6 (six) hours as needed for wheezing or shortness of breath.   amiodarone 200 MG tablet Commonly known as: PACERONE Take 1 tablet (200 mg total) by mouth daily. For 2 weeks then stop.   amLODipine 10 MG tablet Commonly known as: NORVASC TAKE 1 TABLET (10 MG TOTAL) BY MOUTH DAILY.   aspirin EC 325 MG tablet Take 1 tablet (325 mg total) by mouth daily.   cloNIDine 0.3 mg/24hr patch Commonly known as: CATAPRES - Dosed in mg/24 hr Place 1 patch (0.3 mg total) onto the skin once a  week.   famotidine 10 MG tablet Commonly known as: PEPCID Take 1 tablet (10 mg total) by mouth at bedtime.   hydrALAZINE 25 MG tablet Commonly known as: APRESOLINE Take 1 tablet (25 mg total) by mouth every 8 (eight) hours.   metoprolol tartrate 50 MG tablet Commonly known as: LOPRESSOR Take 1 tablet (50 mg total) by mouth 2 (two) times daily.   multivitamin Tabs tablet Take 1 tablet by mouth at bedtime.   ondansetron 4 MG tablet Commonly known as: Zofran Take 1 tablet (4 mg total) by mouth every 8 (eight) hours as needed for nausea or vomiting.   oxyCODONE 5 MG immediate release tablet Commonly known as: Oxy IR/ROXICODONE Take 1 tablet (5 mg  total) by mouth every 4 (four) hours as needed for severe pain.   pregabalin 75 MG capsule Commonly known as: LYRICA TAKE 1 CAPSULE (75 MG TOTAL) BY MOUTH 2 (TWO) TIMES DAILY.            Durable Medical Equipment  (From admission, onward)         Start     Ordered   04/22/19 1534  For home use only DME 3 n 1  Once     04/22/19 1534   04/20/19 0705  For home use only DME Walker rolling  Once    Comments: Please provide walker with 5" wheels  Question:  Patient needs a walker to treat with the following condition  Answer:  Physical deconditioning   04/20/19 0704         The patient has been discharged on:   1.Beta Blocker:  Yes [  x ]                              No   [   ]                              If No, reason:  2.Ace Inhibitor/ARB: Yes [   ]                                     No  [  x  ]                                     If No, reason:CKD (stage IV)  3.Statin:   Yes [   ]                  No  [  x ]                  If No, reason:No CAD  4.Shela Commons:  Yes  [ x  ]                  No   [   ]                  If No, reason:   Follow Up Appointments: Follow-up Information    Wonda Olds, MD. Go on 05/05/2019.   Specialty: Cardiothoracic Surgery Why: Appointment time is at 4:00 pm Contact  information: [address] [place] [address] [phone]        [name], MD. Call.   Specialties: Vascular Surgery, Cardiology Why: for a follow up after having had  fistula and TDC placement11/19/2020 Contact information: [place] [address] [phone]        Madelon Lips, MD. Call.   Specialty: Nephrology Why: for a follow up appointment  Contact information: [place] [address] [phone]        [facility] Follow up.   Why: rolling walker arranged- delivered to room          Signed: Sharalyn Ink [facility] 04/23/2019, 10:28 AM

## 2019-04-05 NOTE — Progress Notes (Signed)
CT surgery p.m. rounds  Patient agitated in restraints of all extremities Rate controlled atrial fibrillation blood pressure stable O2 saturation 98% on a Lasix drip Chest tube output minimal P.m. labs-hemoglobin 8.6, potassium 4.3, creatinine 3.5

## 2019-04-06 ENCOUNTER — Inpatient Hospital Stay (HOSPITAL_COMMUNITY): Payer: Medicare Other

## 2019-04-06 ENCOUNTER — Encounter (HOSPITAL_COMMUNITY): Payer: Self-pay | Admitting: Cardiothoracic Surgery

## 2019-04-06 LAB — POCT I-STAT 7, (LYTES, BLD GAS, ICA,H+H)
Acid-base deficit: 11 mmol/L — ABNORMAL HIGH (ref 0.0–2.0)
Acid-base deficit: 5 mmol/L — ABNORMAL HIGH (ref 0.0–2.0)
Acid-base deficit: 3 mmol/L — ABNORMAL HIGH (ref 0.0–2.0)
Acid-base deficit: 8 mmol/L — ABNORMAL HIGH (ref 0.0–2.0)
Bicarbonate: 21 mmol/L (ref 20.0–28.0)
Bicarbonate: 23 mmol/L (ref 20.0–28.0)
Bicarbonate: 26.2 mmol/L (ref 20.0–28.0)
Bicarbonate: 17.6 mmol/L — ABNORMAL LOW (ref 20.0–28.0)
Calcium, Ion: 1.06 mmol/L — ABNORMAL LOW (ref 1.15–1.40)
Calcium, Ion: 1.04 mmol/L — ABNORMAL LOW (ref 1.15–1.40)
Calcium, Ion: 1.07 mmol/L — ABNORMAL LOW (ref 1.15–1.40)
Calcium, Ion: 0.87 mmol/L — CL (ref 1.15–1.40)
HCT: 33 % — ABNORMAL LOW (ref 36.0–46.0)
HCT: 31 % — ABNORMAL LOW (ref 36.0–46.0)
HCT: 33 % — ABNORMAL LOW (ref 36.0–46.0)
HCT: 23 % — ABNORMAL LOW (ref 36.0–46.0)
Hemoglobin: 11.2 g/dL — ABNORMAL LOW (ref 12.0–15.0)
Hemoglobin: 10.5 g/dL — ABNORMAL LOW (ref 12.0–15.0)
Hemoglobin: 11.2 g/dL — ABNORMAL LOW (ref 12.0–15.0)
Hemoglobin: 7.8 g/dL — ABNORMAL LOW (ref 12.0–15.0)
O2 Saturation: 87 %
O2 Saturation: 99 %
O2 Saturation: 92 %
O2 Saturation: 99 %
Patient temperature: 37
Patient temperature: 36.7
Potassium: 6 mmol/L — ABNORMAL HIGH (ref 3.5–5.1)
Potassium: 5.6 mmol/L — ABNORMAL HIGH (ref 3.5–5.1)
Potassium: 4.6 mmol/L (ref 3.5–5.1)
Potassium: 3.2 mmol/L — ABNORMAL LOW (ref 3.5–5.1)
Sodium: 142 mmol/L (ref 135–145)
Sodium: 141 mmol/L (ref 135–145)
Sodium: 142 mmol/L (ref 135–145)
Sodium: 146 mmol/L — ABNORMAL HIGH (ref 135–145)
TCO2: 23 mmol/L (ref 22–32)
TCO2: 25 mmol/L (ref 22–32)
TCO2: 28 mmol/L (ref 22–32)
TCO2: 19 mmol/L — ABNORMAL LOW (ref 22–32)
pCO2 arterial: 81 mmHg (ref 32.0–48.0)
pCO2 arterial: 56.6 mmHg — ABNORMAL HIGH (ref 32.0–48.0)
pCO2 arterial: 66.9 mmHg (ref 32.0–48.0)
pCO2 arterial: 34 mmHg (ref 32.0–48.0)
pH, Arterial: 7.021 — CL (ref 7.350–7.450)
pH, Arterial: 7.216 — ABNORMAL LOW (ref 7.350–7.450)
pH, Arterial: 7.201 — ABNORMAL LOW (ref 7.350–7.450)
pH, Arterial: 7.323 — ABNORMAL LOW (ref 7.350–7.450)
pO2, Arterial: 80 mmHg — ABNORMAL LOW (ref 83.0–108.0)
pO2, Arterial: 167 mmHg — ABNORMAL HIGH (ref 83.0–108.0)
pO2, Arterial: 81 mmHg — ABNORMAL LOW (ref 83.0–108.0)
pO2, Arterial: 126 mmHg — ABNORMAL HIGH (ref 83.0–108.0)

## 2019-04-06 LAB — CBC
HCT: 32.1 % — ABNORMAL LOW (ref 36.0–46.0)
Hemoglobin: 10.7 g/dL — ABNORMAL LOW (ref 12.0–15.0)
MCH: 30.1 pg (ref 26.0–34.0)
MCHC: 33.3 g/dL (ref 30.0–36.0)
MCV: 90.4 fL (ref 80.0–100.0)
Platelets: 91 10*3/uL — ABNORMAL LOW (ref 150–400)
RBC: 3.55 MIL/uL — ABNORMAL LOW (ref 3.87–5.11)
RDW: 16.8 % — ABNORMAL HIGH (ref 11.5–15.5)
WBC: 12.5 10*3/uL — ABNORMAL HIGH (ref 4.0–10.5)
nRBC: 0 % (ref 0.0–0.2)

## 2019-04-06 LAB — NA AND K (SODIUM & POTASSIUM), RAND UR
Potassium Urine: 53 mmol/L
Sodium, Ur: 46 mmol/L

## 2019-04-06 LAB — BASIC METABOLIC PANEL
Anion gap: 13 (ref 5–15)
BUN: 42 mg/dL — ABNORMAL HIGH (ref 8–23)
CO2: 20 mmol/L — ABNORMAL LOW (ref 22–32)
Calcium: 7.5 mg/dL — ABNORMAL LOW (ref 8.9–10.3)
Chloride: 108 mmol/L (ref 98–111)
Creatinine, Ser: 4.15 mg/dL — ABNORMAL HIGH (ref 0.44–1.00)
GFR calc Af Amer: 12 mL/min — ABNORMAL LOW (ref >60)
GFR calc non Af Amer: 10 mL/min — ABNORMAL LOW (ref >60)
Glucose, Bld: 125 mg/dL — ABNORMAL HIGH (ref 70–99)
Potassium: 4.7 mmol/L (ref 3.5–5.1)
Sodium: 141 mmol/L (ref 135–145)

## 2019-04-06 LAB — BPAM RBC
Blood Product Expiration Date: 202012052359
Blood Product Expiration Date: 202012052359
Blood Product Expiration Date: 202012052359
Blood Product Expiration Date: 202012052359
Blood Product Expiration Date: 202012052359
ISSUE DATE / TIME: 202011030757
ISSUE DATE / TIME: 202011030757
ISSUE DATE / TIME: 202011031602
ISSUE DATE / TIME: 202011031828
ISSUE DATE / TIME: 202011041603
Unit Type and Rh: 5100
Unit Type and Rh: 5100
Unit Type and Rh: 5100
Unit Type and Rh: 5100
Unit Type and Rh: 5100

## 2019-04-06 LAB — RENAL FUNCTION PANEL
Albumin: 3.2 g/dL — ABNORMAL LOW (ref 3.5–5.0)
Anion gap: 15 (ref 5–15)
BUN: 53 mg/dL — ABNORMAL HIGH (ref 8–23)
CO2: 23 mmol/L (ref 22–32)
Calcium: 7.5 mg/dL — ABNORMAL LOW (ref 8.9–10.3)
Chloride: 103 mmol/L (ref 98–111)
Creatinine, Ser: 5.31 mg/dL — ABNORMAL HIGH (ref 0.44–1.00)
GFR calc Af Amer: 9 mL/min — ABNORMAL LOW (ref >60)
GFR calc non Af Amer: 8 mL/min — ABNORMAL LOW (ref >60)
Glucose, Bld: 166 mg/dL — ABNORMAL HIGH (ref 70–99)
Phosphorus: 9.9 mg/dL — ABNORMAL HIGH (ref 2.5–4.6)
Potassium: 4.6 mmol/L (ref 3.5–5.1)
Sodium: 141 mmol/L (ref 135–145)

## 2019-04-06 LAB — TYPE AND SCREEN
ABO/RH(D): O POS
Antibody Screen: NEGATIVE
Unit division: 0
Unit division: 0
Unit division: 0
Unit division: 0
Unit division: 0

## 2019-04-06 LAB — URINALYSIS, ROUTINE W REFLEX MICROSCOPIC
Bilirubin Urine: NEGATIVE
Glucose, UA: NEGATIVE mg/dL
Ketones, ur: NEGATIVE mg/dL
Nitrite: NEGATIVE
Protein, ur: >=300 mg/dL — AB
RBC / HPF: >50 RBC/hpf — ABNORMAL HIGH (ref 0–5)
Specific Gravity, Urine: 1.014 (ref 1.005–1.030)
WBC, UA: >50 WBC/hpf — ABNORMAL HIGH (ref 0–5)
pH: 5 (ref 5.0–8.0)

## 2019-04-06 LAB — HEPATITIS B SURFACE ANTIGEN: Hepatitis B Surface Ag: NONREACTIVE

## 2019-04-06 LAB — COOXEMETRY PANEL
Carboxyhemoglobin: 0.6 % (ref 0.5–1.5)
Methemoglobin: 0.8 % (ref 0.0–1.5)
O2 Saturation: 74.4 %
Total hemoglobin: 14.5 g/dL (ref 12.0–16.0)

## 2019-04-06 LAB — MAGNESIUM: Magnesium: 2.2 mg/dL (ref 1.7–2.4)

## 2019-04-06 LAB — GLUCOSE, CAPILLARY
Glucose-Capillary: 116 mg/dL — ABNORMAL HIGH (ref 70–99)
Glucose-Capillary: 135 mg/dL — ABNORMAL HIGH (ref 70–99)
Glucose-Capillary: 136 mg/dL — ABNORMAL HIGH (ref 70–99)
Glucose-Capillary: 77 mg/dL (ref 70–99)
Glucose-Capillary: 106 mg/dL — ABNORMAL HIGH (ref 70–99)
Glucose-Capillary: 119 mg/dL — ABNORMAL HIGH (ref 70–99)

## 2019-04-06 LAB — HEPATITIS B SURFACE ANTIBODY,QUALITATIVE: Hep B S Ab: REACTIVE — AB

## 2019-04-06 LAB — HEPATITIS B CORE ANTIBODY, TOTAL: Hep B Core Total Ab: NONREACTIVE

## 2019-04-06 LAB — PHOSPHORUS: Phosphorus: 9.3 mg/dL — ABNORMAL HIGH (ref 2.5–4.6)

## 2019-04-06 MED ORDER — PANTOPRAZOLE SODIUM 40 MG PO PACK: 40.0000 mg | PACK | Freq: Every day | ORAL | Status: DC

## 2019-04-06 MED ORDER — SODIUM CHLORIDE 0.9 % IV SOLN
1.0000 g | INTRAVENOUS | Status: AC
  Administered 2019-04-06 – 2019-04-08 (×3): 1 g via INTRAVENOUS
  Filled 2019-04-06: qty 10
  Filled 2019-04-06 (×2): qty 1

## 2019-04-06 MED ORDER — LIDOCAINE-PRILOCAINE 2.5-2.5 % EX CREA
1.0000 "application " | TOPICAL_CREAM | CUTANEOUS | Status: DC | PRN
  Filled 2019-04-06: qty 5

## 2019-04-06 MED ORDER — SODIUM ZIRCONIUM CYCLOSILICATE 10 G PO PACK
10.0000 g | PACK | Freq: Once | ORAL | Status: AC
  Administered 2019-04-06: 10 g via ORAL
  Filled 2019-04-06: qty 1

## 2019-04-06 MED ORDER — ASPIRIN 81 MG PO CHEW
324.0000 mg | CHEWABLE_TABLET | Freq: Every day | ORAL | Status: DC
  Administered 2019-04-09 – 2019-04-23 (×15): 324 mg via ORAL
  Filled 2019-04-06 (×16): qty 4

## 2019-04-06 MED ORDER — FUROSEMIDE 10 MG/ML IJ SOLN
8.0000 mg/h | INTRAVENOUS | Status: DC
  Administered 2019-04-06: 10:00:00 8 mg/h via INTRAVENOUS
  Filled 2019-04-06: qty 25

## 2019-04-06 MED ORDER — HEPARIN SODIUM (PORCINE) 1000 UNIT/ML IJ SOLN
INTRAMUSCULAR | Status: AC
  Filled 2019-04-06: qty 4

## 2019-04-06 MED ORDER — LIDOCAINE HCL (PF) 1 % IJ SOLN: 5.0000 mL | INTRAMUSCULAR | Status: DC | PRN

## 2019-04-06 MED ORDER — SODIUM BICARBONATE 8.4 % IV SOLN
INTRAVENOUS | Status: AC
  Administered 2019-04-06: 07:00:00 50 meq
  Filled 2019-04-06: qty 50

## 2019-04-06 MED ORDER — SENNOSIDES 8.8 MG/5ML PO SYRP
5.0000 mL | ORAL_SOLUTION | Freq: Every day | ORAL | Status: DC
  Administered 2019-04-07: 21:00:00 5 mL via ORAL
  Filled 2019-04-06: qty 5

## 2019-04-06 MED ORDER — ALTEPLASE 2 MG IJ SOLR: 2.0000 mg | Freq: Once | INTRAMUSCULAR | Status: DC | PRN

## 2019-04-06 MED ORDER — SODIUM CHLORIDE 0.9 % IV SOLN: 100.0000 mL | INTRAVENOUS | Status: DC | PRN

## 2019-04-06 MED ORDER — FENTANYL CITRATE (PF) 100 MCG/2ML IJ SOLN
INTRAMUSCULAR | Status: AC
  Administered 2019-04-06: 50 ug
  Filled 2019-04-06: qty 2

## 2019-04-06 MED ORDER — METOLAZONE 5 MG PO TABS
5.0000 mg | ORAL_TABLET | Freq: Once | ORAL | Status: AC
  Administered 2019-04-06: 5 mg via ORAL
  Filled 2019-04-06: qty 1

## 2019-04-06 MED ORDER — ALBUMIN HUMAN 5 % IV SOLN
INTRAVENOUS | Status: AC
  Filled 2019-04-06: qty 250

## 2019-04-06 MED ORDER — PENTAFLUOROPROP-TETRAFLUOROETH EX AERO: 1.0000 "application " | INHALATION_SPRAY | CUTANEOUS | Status: DC | PRN

## 2019-04-06 MED ORDER — ORAL CARE MOUTH RINSE
15.0000 mL | Freq: Two times a day (BID) | OROMUCOSAL | Status: DC
  Administered 2019-04-06: 15 mL via OROMUCOSAL

## 2019-04-06 MED ORDER — DEXMEDETOMIDINE HCL IN NACL 400 MCG/100ML IV SOLN
0.0000 ug/kg/h | INTRAVENOUS | Status: DC
  Filled 2019-04-06: qty 100

## 2019-04-06 MED ORDER — SODIUM BICARBONATE 8.4 % IV SOLN
INTRAVENOUS | Status: AC
  Filled 2019-04-06: qty 50

## 2019-04-06 MED ORDER — FUROSEMIDE 10 MG/ML IJ SOLN
80.0000 mg | Freq: Once | INTRAMUSCULAR | Status: AC
  Administered 2019-04-06: 08:00:00 80 mg via INTRAVENOUS

## 2019-04-06 MED ORDER — DOCUSATE SODIUM 50 MG/5ML PO LIQD: 200.0000 mg | Freq: Every day | ORAL | Status: DC

## 2019-04-06 MED ORDER — NITROPRUSSIDE SODIUM-NACL 20-0.9 MG/100ML-% IV SOLN
0.0000 ug/kg/min | INTRAVENOUS | Status: DC
  Administered 2019-04-07: 0.6 ug/kg/min via INTRAVENOUS
  Administered 2019-04-07: 08:00:00 0.3 ug/kg/min via INTRAVENOUS
  Administered 2019-04-08: 11:00:00 1 ug/kg/min via INTRAVENOUS
  Administered 2019-04-08: 0.6 ug/kg/min via INTRAVENOUS
  Filled 2019-04-06 (×5): qty 100

## 2019-04-06 MED ORDER — ALBUMIN HUMAN 5 % IV SOLN
25.0000 g | Freq: Once | INTRAVENOUS | Status: AC
  Administered 2019-04-06: 01:00:00 25 g via INTRAVENOUS

## 2019-04-06 MED ORDER — DOPAMINE-DEXTROSE 3.2-5 MG/ML-% IV SOLN: 0.0000 ug/kg/min | INTRAVENOUS | Status: DC

## 2019-04-06 MED ORDER — CHLORHEXIDINE GLUCONATE CLOTH 2 % EX PADS: 6.0000 | MEDICATED_PAD | Freq: Every day | CUTANEOUS | Status: DC

## 2019-04-06 MED ORDER — LACTATED RINGERS IV SOLN
INTRAVENOUS | Status: DC
  Administered 2019-04-06: 03:00:00 via INTRAVENOUS

## 2019-04-06 MED ORDER — HEPARIN SODIUM (PORCINE) 1000 UNIT/ML DIALYSIS
1000.0000 [IU] | INTRAMUSCULAR | Status: DC | PRN
  Administered 2019-04-06: 2400 [IU] via INTRAVENOUS_CENTRAL
  Administered 2019-04-07 – 2019-04-08 (×2): 1000 [IU] via INTRAVENOUS_CENTRAL
  Filled 2019-04-06 (×3): qty 1

## 2019-04-06 MED ORDER — SODIUM BICARBONATE 8.4 % IV SOLN
100.0000 meq | Freq: Once | INTRAVENOUS | Status: AC
  Administered 2019-04-06: 12:00:00 100 meq via INTRAVENOUS

## 2019-04-06 MED ORDER — NITROPRUSSIDE SODIUM-NACL 10-0.9 MG/50ML-% IV SOLN
0.0000 ug/kg/min | INTRAVENOUS | Status: DC
  Filled 2019-04-06: qty 50

## 2019-04-06 NOTE — Progress Notes (Signed)
     [address]       [place] [address]             [phone]       Not making much urine despite being on lasix gtt On bipap now.  PCO2 in 60s Plans for dialysis today.

## 2019-04-06 NOTE — Evaluation (Signed)
Clinical/Bedside Swallow Evaluation Patient Details  Name: Monique Henderson MRN: [id number] Date of Birth: 02/22/1951  Today's Date: 04/06/2019 Time: SLP Start Time (ACUTE ONLY): 57 SLP Stop Time (ACUTE ONLY): 1250 SLP Time Calculation (min) (ACUTE ONLY): 20 min  Past Medical History:  Past Medical History:  Diagnosis Date  . Atelectasis 2002   Bilateral  . Bone spur 2008   Right calcaneal foot spur  . Breast cancer ([place]) 2004   Ductal carcinoma in situ of the left breast; S/P left partial mastectomy 02/26/2003; S/P re-excision of cranial and lateral margins11/18/2004.radiation  . Breast cancer ([place]) 09/21/2012   right breast/ last radiation treatment 03/22/2013  . Chronic kidney disease, stage IV (severe) ([place]) 10/10/2007  . 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 Autumn Messing; S/P re-excision of superior and inferior margins 10/27/2012.   Marland Kitchen GERD (gastroesophageal reflux disease)   . 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 Hot flashes   . Hx of radiation therapy 2005   left breast  . Hx of radiation therapy 01/11/13- 03/22/13   right breast 5760 cGy 30 sessions  . Hypertension   . Low back pain   . Lumbar spinal stenosis    S/P lumbar decompressive laminectomy, fusion and plating for lumbar spinal stensosis  . Normocytic anemia    With thrombocytosis  . Osteoarthritis   . Personal history of radiation therapy   . Right ureteral stone 2002  . Shortness of breath    from pain  . Uterine fibroid   . Wears dentures    top   Past Surgical History:  Past Surgical History:  Procedure Laterality Date  . ANTERIOR LAT LUMBAR FUSION N/A 01/18/2014   Procedure: ANTERIOR LATERAL LUMBAR FUSION LUMBAR TWO-THREE;  Surgeon: Eustace Moore, MD;  Location: [facility];  Service: Neurosurgery;  Laterality: N/A;  . ANTERIOR LUMBAR FUSION  01/18/2014  . BACK SURGERY    . BREAST LUMPECTOMY Left 01/2003  .  BREAST LUMPECTOMY Right 2014  . BREAST LUMPECTOMY WITH NEEDLE LOCALIZATION AND AXILLARY SENTINEL LYMPH NODE BX Right 10/13/2012   Procedure: BREAST LUMPECTOMY WITH NEEDLE LOCALIZATION;  Surgeon: [name], MD;  Location: [facility];  Service: General;  Laterality: Right;  Right breast wire localized lumpectomy  . LAMINECTOMY  05/27/2009   Lumbar decompressive laminectomy, fusion and plating for lumbar spinal stensosis  . LUMBAR LAMINECTOMY/DECOMPRESSION MICRODISCECTOMY Left 03/23/2013   Procedure: LUMBAR LAMINECTOMY/DECOMPRESSION MICRODISCECTOMY 1 LEVEL;  Surgeon: Eustace Moore, MD;  Location: [facility];  Service: Neurosurgery;  Laterality: Left;  LUMBAR LAMINECTOMY/DECOMPRESSION MICRODISCECTOMY 1 LEVEL  . MASTECTOMY, PARTIAL Left 02/26/2003   ; S/P re-excision of cranial and lateral margins 04/19/2003.   Marland Kitchen RE-EXCISION OF BREAST CANCER,SUPERIOR MARGINS Right 10/27/2012   Procedure: RE-EXCISION OF BREAST CANCER,SUPERIOR and inferior MARGINS;  Surgeon: [name], MD;  Location: [facility];  Service: General;  Laterality: Right;  . RE-EXCISION OF BREAST LUMPECTOMY Left 04/2003  . TEE WITHOUT CARDIOVERSION N/A 04/04/2019   Procedure: Transesophageal Echocardiogram (Tee);  Surgeon: Wonda Olds, MD;  Location: [facility];  Service: Open Heart Surgery;  Laterality: N/A;  . THORACIC AORTIC ANEURYSM REPAIR N/A 04/04/2019   Procedure: THORACIC ASCENDING ANEURYSM REPAIR (AAA)  USING 28 MM X 30 CM HEMASHIELD PLATINUM VASCULAR GRAFT;  Surgeon: Wonda Olds, MD;  Location: [facility];  Service: Open Heart Surgery;  Laterality: N/A;  HPI:  68yo female admitted with severe chest pain. 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 Pt intubated for surgery, extubated 11/4. PMH: uncontrolled HTN, CKD4, hyperkalemia, breast cancer s/p rad tx, GERD, hepC   Assessment / Plan / Recommendation Clinical Impression  Pt presents with adequate oral motor  strength and function. She is edentulous. Oral care was completed with suction, which pt tolerated well. Following oral care, pt accepted trials of ice chips, thin liquid, and puree. Slight discoordination noted with thin liquid, with audible swallow intermittently. No overt s/s aspiration observed after any trial. Recommend beginning dys 1 (puree) diet and thin liquids, meds crushed in puree. Pt should be given PO only when upright and fully awake. 1:1 assist with all PO intake. SLP will follow up for assessment of diet tolerance and education. Safe swallow precautions posted at [employer]. RN and MD informed of results and recommendations.    SLP Visit Diagnosis: Dysphagia, unspecified (R13.10)    Aspiration Risk  Mild aspiration risk    Diet Recommendation Dysphagia 1 (Puree);Thin liquid   Liquid Administration via: Cup;Straw Medication Administration: Crushed with puree Supervision: Full supervision/cueing for compensatory strategies;Staff to assist with self feeding Compensations: Minimize environmental distractions;Slow rate;Small sips/bites Postural Changes: Seated upright at 90 degrees;Remain upright for at least 30 minutes after po intake    Other  Recommendations Oral Care Recommendations: Oral care QID Other Recommendations: Have oral suction available   Follow up Recommendations Other (comment)(TBD)      Frequency and Duration min 2x/week  1 week;2 weeks       Prognosis Prognosis for Safe Diet Advancement: Good Barriers to Reach Goals: Cognitive deficits      Swallow Study   General Date of Onset: 04/04/19 HPI: 68yo female admitted with severe chest pain. 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 Pt intubated for surgery, extubated 11/4. PMH: uncontrolled HTN, CKD4, hyperkalemia, breast cancer s/p rad tx, GERD, hepC Type of Study: Bedside Swallow Evaluation Previous Swallow Assessment: none Diet Prior to this Study:  NPO Temperature Spikes Noted: No Respiratory Status: Nasal cannula History of Recent Intubation: Yes Length of Intubations (days): 2 days Date extubated: 04/05/19 Behavior/Cognition: Alert;Cooperative;Pleasant mood;Confused;Requires cueing Oral Cavity Assessment: Within Functional Limits Oral Care Completed by SLP: Yes Oral Cavity - Dentition: Edentulous Vision: Functional for self-feeding Self-Feeding Abilities: Total assist Patient Positioning: Upright in chair Baseline Vocal Quality: Normal Volitional Cough: Weak Volitional Swallow: Able to elicit    Oral/Motor/Sensory Function Overall Oral Motor/Sensory Function: Within functional limits   Ice Chips Ice chips: Within functional limits Presentation: Spoon   Thin Liquid Thin Liquid: Within functional limits Presentation: Straw    Nectar Thick Nectar Thick Liquid: Not tested   Honey Thick Honey Thick Liquid: Not tested   Puree Puree: Within functional limits Presentation: Spoon   Solid     Solid: Not tested     Enriqueta Shutter, [facility], [place] Pathologist Office: [phone] Pager: [phone]  Shonna Chock 04/06/2019,12:50 PM

## 2019-04-06 NOTE — Progress Notes (Signed)
Nephrology resident was in room with me discussing patient's need for dialysis. Patient became very restless and diaphoretic after getting her back to the bed from the chair. ABG done. PH 7.20 and CO2 66. RN placed Bipap on patient. Patient's eyes rolled back in head and she became unresponsive and pulseless. She maintained a HR of 60, intrinsically. Code called. Myself and the resident bagged her and did compressions. Upon doing compressions, patient awoke. Still no pulse. Levo started. Compressions started again, patient awoke again. Pulse regained.  Dr. Roxan Hockey TCTS and Dr. Lamonte Sakai CCM was present for code.   RN paged Dr. Orvan Seen and updated him with sequence of events.

## 2019-04-06 NOTE — Procedures (Signed)
Central Venous Catheter Insertion Procedure Note MARLEY PAKULA [id number] 1951/01/21  Procedure: Insertion of Central Venous Catheter Indications: dialysis access  Procedure Details Consent: Unable to obtain consent because of altered level of consciousness. Time Out: Verified patient identification, verified procedure, site/side was marked, verified correct patient position, special equipment/implants available, medications/allergies/relevent history reviewed, required imaging and test results available.  Performed  Maximum sterile technique was used including antiseptics, cap, gloves, gown, hand hygiene, mask and sheet. Skin prep: Chlorhexidine; local anesthetic administered A antimicrobial bonded/coated triple lumen catheter was placed in the left subclavian vein using the Seldinger technique.  Evaluation Blood flow good Complications: No apparent complications Patient did tolerate procedure well. Chest X-ray ordered to verify placement.  CXR: pending.  Wonda Olds 04/06/2019, 5:35 PM

## 2019-04-06 NOTE — Progress Notes (Signed)
RT called to patient beside. ABG showed CO2 80 and Ph 7.0. RT placed patient on BIPAP 20/6 R15 and 100%. Patient is in restraints and MD is aware.

## 2019-04-06 NOTE — Progress Notes (Signed)
RT NOTE: RT and RN placed patient on 6L [place]. Patient says breathing is comfortable and sats 98%. Patient tolerating well at this time. RT will continue to monitor as needed.

## 2019-04-06 NOTE — Progress Notes (Addendum)
Orders given to begin Amiodarone bolus & gtt, per Dr. Orvan Seen. While receiving Amio bolus, Pt became bradycardic and hypotensive. RN notified Dr. Orvan Seen and verbal orders given to d/c Amio infusion. Pt has < 30 ml urine output since 1900-- Levo gtt restarted, MD notified.  Pt placed back on pacer- DDD at 90.  @0630 -  Pt change in mental; status-- unresponsive,  labored/ shallow breathing. Restraints removed. Dr. Nils Pyle notified of Pt change of status and ABG results. Verbal orders given to give 1 amp Bicarb and start Pt on Bipap.

## 2019-04-06 NOTE — Plan of Care (Signed)
Renal cross-cover - late entry.   Minimal urine output despite lasix gtt and metolazone.  She was initiated on BIPAP this afternoon.  Lasix d/c'd.  Spoke with CTS.  Plan for HD today.  CTS at bedside to place nontunneled catheter - appreciate their assistance.   Harrie Jeans, MD

## 2019-04-06 NOTE — Consult Note (Signed)
[facility] Nephrology Consultation Note  Requesting MD: Dr. Orvan Seen Reason for consult: combined respiratory, metabolic acidosis  HPI:  Monique Henderson is a 68 y.o. female. 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  Is now s/p  Surgical repair w/ graft placement. Pt became agitated after extubation and had desaturatoins into the 70s on [place].  Is now on bipap, with O2 sats 100%. Patient has minimal urinary output since yesterday afternoon despite lasix drip. Pt found to have acidosis on ABG of 7.021 this morning. Patient unable to give history d/t bipap but is able to nod head appropriately to questions and states she has no complaints or concerns.   Creatinine  Date/Time Value Ref Range Status  09/28/2012 08:31 AM 1.2 (H) 0.6 - 1.1 mg/dL Final   Creat  Date/Time Value Ref Range Status  01/18/2017 11:45 AM 2.09 (H) 0.50 - 0.99 mg/dL Final    Comment:      For patients > or = 66 years of age: The upper reference limit for Creatinine is approximately 13% higher for people identified as African-American.     12/09/2016 02:58 PM 2.23 (H) 0.50 - 0.99 mg/dL Final    Comment:      For patients > or = 66 years of age: The upper reference limit for Creatinine is approximately 13% higher for people identified as African-American.     02/14/2014 09:51 AM 1.24 (H) 0.50 - 1.10 mg/dL Final  08/28/2013 10:21 AM 1.52 (H) 0.50 - 1.10 mg/dL Final  02/22/2013 11:11 AM 1.33 (H) 0.50 - 1.10 mg/dL Final  12/21/2012 09:57 AM 1.38 (H) 0.50 - 1.10 mg/dL Final  10/12/2012 12:13 PM 2.44 (H) 0.50 - 1.10 mg/dL Final  04/20/2012 12:20 PM 1.66 (H) 0.50 - 1.10 mg/dL Final  02/25/2011 09:46 AM 1.43 (H) 0.50 - 1.10 mg/dL Final   Creatinine, Ser  Date/Time Value Ref Range Status  04/06/2019 02:50 AM 4.15 (H) 0.44 - 1.00 mg/dL Final  04/05/2019 04:28 PM 3.54 (H) 0.44 - 1.00 mg/dL Final  04/05/2019 10:53 AM  3.11 (H) 0.44 - 1.00 mg/dL Final  04/05/2019 04:42 AM 3.16 (H) 0.44 - 1.00 mg/dL Final  04/04/2019 07:40 PM 2.78 (H) 0.44 - 1.00 mg/dL Final  04/04/2019 12:22 PM 2.80 (H) 0.44 - 1.00 mg/dL Final  04/04/2019 11:17 AM 2.70 (H) 0.44 - 1.00 mg/dL Final  04/04/2019 10:08 AM 2.80 (H) 0.44 - 1.00 mg/dL Final  04/04/2019 09:49 AM 3.30 (H) 0.44 - 1.00 mg/dL Final  04/04/2019 08:25 AM 3.30 (H) 0.44 - 1.00 mg/dL Final  04/03/2019 06:22 PM 3.36 (H) 0.44 - 1.00 mg/dL Final  02/24/2019 12:44 PM 2.39 (H) 0.57 - 1.00 mg/dL Final  05/04/2018 09:22 AM 2.65 (H) 0.57 - 1.00 mg/dL Final  12/22/2017 10:39 AM 2.86 (H) 0.57 - 1.00 mg/dL Final  08/16/2017 11:09 AM 1.46 (H) 0.57 - 1.00 mg/dL Final  06/23/2017 12:20 PM 1.57 (H) 0.57 - 1.00 mg/dL Final  03/14/2015 11:20 AM 1.95 (H) 0.44 - 1.00 mg/dL Final  01/25/2014 06:47 PM 1.73 (H) 0.50 - 1.10 mg/dL Final  01/08/2014 10:51 AM 1.40 (H) 0.50 - 1.10 mg/dL Final  12/05/2013 10:11 AM 1.38 (H) 0.50 - 1.10 mg/dL Final  03/21/2013 03:00 PM 1.53 (H) 0.50 - 1.10 mg/dL Final  03/01/2013 10:55 AM 1.07 0.50 - 1.10 mg/dL Final  03/01/2013 05:20 AM 1.12 (H) 0.50 - 1.10 mg/dL Final  02/27/2013 06:19 PM 1.32 (H)  0.50 - 1.10 mg/dL Final  10/27/2012 06:15 AM 1.65 (H) 0.50 - 1.10 mg/dL Final  10/13/2012 11:29 AM 1.30 (H) 0.50 - 1.10 mg/dL Final  12/18/2009 07:18 PM 1.36 (H) (0.40-1.20 mg/dL Final  10/30/2009 07:44 PM 1.26 (H) (0.40-1.20 mg/dL Final  07/17/2009 06:19 PM 1.56 (H) (0.40-1.20 mg/dL Final  05/23/2009 12:56 PM 1.23 (H) 0.4 - 1.2 mg/dL Final  01/17/2009 10:49 PM 1.29 (H) (0.40-1.20 mg/dL Final  09/20/2008 08:21 PM 1.24 (H) 0.40 - 1.20 mg/dL Final  08/29/2008 08:17 PM 1.28 (H) 0.40 - 1.20 mg/dL Final  06/07/2008 07:55 PM 1.16 0.40 - 1.20 mg/dL Final  12/23/2007 08:49 PM 1.47 (H) 0.40 - 1.20 mg/dL Final  11/30/2007 08:27 PM 1.43 (H) 0.40 - 1.20 mg/dL Final  10/31/2007 09:45 PM 1.68 (H) 0.40 - 1.20 mg/dL Final  10/07/2007 08:48 PM 1.51 (H) 0.40 - 1.20 mg/dL Final   12/02/2006 08:49 PM 1.24 (H) 0.40 - 1.20 mg/dL Final  06/10/2006 09:34 PM 0.87 0.40 - 1.20 mg/dL Final     PMHx:   Past Medical History:  Diagnosis Date  . Atelectasis 2002   Bilateral  . Bone spur 2008   Right calcaneal foot spur  . Breast cancer ([place]) 2004   Ductal carcinoma in situ of the left breast; S/P left partial mastectomy 02/26/2003; S/P re-excision of cranial and lateral margins11/18/2004.radiation  . Breast cancer ([place]) 09/21/2012   right breast/ last radiation treatment 03/22/2013  . Chronic kidney disease, stage IV (severe) ([place]) 10/10/2007  . 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 Autumn Messing; S/P re-excision of superior and inferior margins 10/27/2012.   Marland Kitchen GERD (gastroesophageal reflux disease)   . 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 Hot flashes   . Hx of radiation therapy 2005   left breast  . Hx of radiation therapy 01/11/13- 03/22/13   right breast 5760 cGy 30 sessions  . Hypertension   . Low back pain   . Lumbar spinal stenosis    S/P lumbar decompressive laminectomy, fusion and plating for lumbar spinal stensosis  . Normocytic anemia    With thrombocytosis  . Osteoarthritis   . Personal history of radiation therapy   . Right ureteral stone 2002  . Shortness of breath    from pain  . Uterine fibroid   . Wears dentures    top    Past Surgical History:  Procedure Laterality Date  . ANTERIOR LAT LUMBAR FUSION N/A 01/18/2014   Procedure: ANTERIOR LATERAL LUMBAR FUSION LUMBAR TWO-THREE;  Surgeon: Eustace Moore, MD;  Location: [facility];  Service: Neurosurgery;  Laterality: N/A;  . ANTERIOR LUMBAR FUSION  01/18/2014  . BACK SURGERY    . BREAST LUMPECTOMY Left 01/2003  . BREAST LUMPECTOMY Right 2014  . BREAST LUMPECTOMY WITH NEEDLE LOCALIZATION AND AXILLARY SENTINEL LYMPH NODE BX Right 10/13/2012   Procedure: BREAST LUMPECTOMY WITH NEEDLE LOCALIZATION;  Surgeon: [name], MD;  Location: [facility];  Service: General;  Laterality: Right;  Right breast wire localized lumpectomy  . LAMINECTOMY  05/27/2009   Lumbar decompressive laminectomy, fusion and plating for lumbar spinal stensosis  . LUMBAR LAMINECTOMY/DECOMPRESSION MICRODISCECTOMY Left 03/23/2013   Procedure: LUMBAR LAMINECTOMY/DECOMPRESSION MICRODISCECTOMY 1 LEVEL;  Surgeon: Eustace Moore, MD;  Location: [facility];  Service: Neurosurgery;  Laterality: Left;  LUMBAR LAMINECTOMY/DECOMPRESSION MICRODISCECTOMY 1 LEVEL  . MASTECTOMY, PARTIAL Left 02/26/2003   ; S/P  re-excision of cranial and lateral margins 04/19/2003.   Marland Kitchen RE-EXCISION OF BREAST CANCER,SUPERIOR MARGINS Right 10/27/2012   Procedure: RE-EXCISION OF BREAST CANCER,SUPERIOR and inferior MARGINS;  Surgeon: [name], MD;  Location: [facility];  Service: General;  Laterality: Right;  . RE-EXCISION OF BREAST LUMPECTOMY Left 04/2003  . TEE WITHOUT CARDIOVERSION N/A 04/04/2019   Procedure: Transesophageal Echocardiogram (Tee);  Surgeon: Wonda Olds, MD;  Location: [facility];  Service: Open Heart Surgery;  Laterality: N/A;  . THORACIC AORTIC ANEURYSM REPAIR N/A 04/04/2019   Procedure: THORACIC ASCENDING ANEURYSM REPAIR (AAA)  USING 28 MM X 30 CM HEMASHIELD PLATINUM VASCULAR GRAFT;  Surgeon: Wonda Olds, MD;  [facility];  Service: Open Heart Surgery;  Laterality: N/A;    Family Hx:  Family History  Problem Relation Age of Onset  . Colon cancer Mother 20  . Hypertension Mother   . Diabetes Sister 56  . Hypertension Sister   . Diabetes Brother   . Hypertension Brother   . Diabetes Brother   . Hypertension Brother   . Kidney disease Son        On dialysis  . Hypertension Son   . Diabetes Son   . Multiple sclerosis Son   . Bone cancer Sister 47  . Breast cancer Neg Hx   . Cervical cancer Neg Hx     Social History:  reports that she has been smoking cigarettes. She has a 11.00 pack-year smoking history. She  has never used smokeless tobacco. She reports current alcohol use of about 2.0 standard drinks of alcohol per week. She reports current drug use. Drug: Cocaine.  Allergies:  Allergies  Allergen Reactions  . Shrimp [Shellfish Allergy] Shortness Of Breath  . Bactroban [Mupirocin] Other (See Comments)    "Sores in nose"  . Vicodin [Hydrocodone-Acetaminophen] Itching and Nausea And Vomiting    This is patient's home medication  . Lisinopril Cough  . Tylenol [Acetaminophen] Itching    Medications: Prior to Admission medications   Medication Sig Start Date End Date Taking? Authorizing Provider  albuterol (VENTOLIN HFA) 108 (90 Base) MCG/ACT inhaler Inhale 2 puffs into the lungs every 6 (six) hours as needed for wheezing or shortness of breath. 02/02/19  Yes [name], MD  amLODipine (NORVASC) 10 MG tablet TAKE 1 TABLET (10 MG TOTAL) BY MOUTH DAILY. 09/20/18  Yes [name], MD  carvedilol (COREG) 12.5 MG tablet TAKE 1 TABLET (12.5 MG TOTAL) BY MOUTH 2 (TWO) TIMES DAILY WITH A MEAL. 03/22/18  Yes [name], MD  cloNIDine (CATAPRES) 0.2 MG tablet Take 1 tablet (0.2 mg total) by mouth 2 (two) times daily. 05/04/18  Yes [name], MD  famotidine (PEPCID) 10 MG tablet Take 1 tablet (10 mg total) by mouth at bedtime. 05/04/18  Yes [name], MD  pregabalin (LYRICA) 75 MG capsule TAKE 1 CAPSULE (75 MG TOTAL) BY MOUTH 2 (TWO) TIMES DAILY. 09/15/18  Yes Forrest Moron, MD  oxyCODONE-acetaminophen (PERCOCET) 5-325 MG tablet Take 1 tablet by mouth every 6 (six) hours as needed for severe pain. Patient not taking: Reported on 04/03/2019 09/29/17   Edrick Kins, DPM    I have reviewed the patient's current medications.  Labs:  Results for orders placed or performed during the hospital encounter of 04/03/19 (from the past 48 hour(s))  I-STAT, chem 8     Status: Abnormal   Collection Time: 04/04/19 10:08 AM  Result Value Ref Range   Sodium 140  135 - 145 mmol/L   Potassium 2.9 (L) 3.5 - 5.1  mmol/L   Chloride 97 (L) 98 - 111 mmol/L   BUN 24 (H) 8 - 23 mg/dL   Creatinine, Ser 2.80 (H) 0.44 - 1.00 mg/dL   Glucose, Bld 163 (H) 70 - 99 mg/dL   Calcium, Ion 0.97 (L) 1.15 - 1.40 mmol/L   TCO2 25 22 - 32 mmol/L   Hemoglobin 8.8 (L) 12.0 - 15.0 g/dL   HCT 26.0 (L) 36.0 - 46.0 %  I-STAT 7, (LYTES, BLD GAS, ICA, H+H)     Status: Abnormal   Collection Time: 04/04/19 10:22 AM  Result Value Ref Range   pH, Arterial 7.388 7.350 - 7.450   pCO2 arterial 43.4 32.0 - 48.0 mmHg   pO2, Arterial 566.0 (H) 83.0 - 108.0 mmHg   Bicarbonate 26.2 20.0 - 28.0 mmol/L   TCO2 27 22 - 32 mmol/L   O2 Saturation 100.0 %   Acid-Base Excess 1.0 0.0 - 2.0 mmol/L   Sodium 138 135 - 145 mmol/L   Potassium 3.8 3.5 - 5.1 mmol/L   Calcium, Ion 1.00 (L) 1.15 - 1.40 mmol/L   HCT 25.0 (L) 36.0 - 46.0 %   Hemoglobin 8.5 (L) 12.0 - 15.0 g/dL   Patient temperature HIDE    Sample type CARDIOPULMONARY BYPASS   Surgical pathology     Status: None   Collection Time: 04/04/19 10:26 AM  Result Value Ref Range   SURGICAL PATHOLOGY      SURGICAL PATHOLOGY CASE: [id number] PATIENT: Omelia Ignatowski Surgical Pathology Report     Clinical History: Type A aneurysm dissection (cm)     FINAL MICROSCOPIC DIAGNOSIS:  A. PORTION OF ASCENDING AORTA: -  Elastic artery wall with hemorrhage consistent with clinical impression of dissection   GROSS DESCRIPTION:  Received fresh are several pieces of soft to rubbery tissue consistent with clinically stated aorta, ascending.  The pieces are 6.6 x 5.2 x 1.7 cm in aggregate, and individual pieces are up to 1.2 cm thick, with dark red soft material within the layers of the wall.  External surfaces vary from tan-pink to dark red, smooth, and inner surfaces are yellow, smooth.  Representative sections in one block.  SSW 04/04/2019    Final Diagnosis performed by Thressa Sheller, MD.   Electronically signed 04/05/2019 Technical and / or Professional components performed  at [facility]. [facility], [place] [address] [place], [place] [address].  Immunohisto chemistry Technical component (if applicable) was performed at [facility]. [address], [place], [place],  [address].   IMMUNOHISTOCHEMISTRY DISCLAIMER (if applicable): Some of these immunohistochemical stains may have been developed and the performance characteristics determine by [facility]. Some may not have been cleared or approved by the U.S. Food and Drug Administration. The FDA has determined that such clearance or approval is not necessary. This test is used for clinical purposes. It should not be regarded as investigational or for research. This laboratory is certified under the [place] (CLIA-88) as qualified to perform high complexity clinical laboratory testing.  The controls stained appropriately.   I-STAT 7, (LYTES, BLD GAS, ICA, H+H)     Status: Abnormal   Collection Time: 04/04/19 11:12 AM  Result Value Ref Range   pH, Arterial 7.252 (L) 7.350 - 7.450   pCO2 arterial 58.2 (H) 32.0 - 48.0 mmHg   pO2, Arterial 443.0 (H) 83.0 - 108.0 mmHg   Bicarbonate 25.6 20.0 - 28.0  mmol/L   TCO2 27 22 - 32 mmol/L   O2 Saturation 100.0 %   Acid-base deficit 2.0 0.0 - 2.0 mmol/L   Sodium 138 135 - 145 mmol/L   Potassium 4.2 3.5 - 5.1 mmol/L   Calcium, Ion 1.07 (L) 1.15 - 1.40 mmol/L   HCT 26.0 (L) 36.0 - 46.0 %   Hemoglobin 8.8 (L) 12.0 - 15.0 g/dL   Patient temperature HIDE    Sample type CARDIOPULMONARY BYPASS   I-STAT, chem 8     Status: Abnormal   Collection Time: 04/04/19 11:17 AM  Result Value Ref Range   Sodium 138 135 - 145 mmol/L   Potassium 4.3 3.5 - 5.1 mmol/L   Chloride 101 98 - 111 mmol/L   BUN 30 (H) 8 - 23 mg/dL   Creatinine, Ser 2.70 (H) 0.44 - 1.00 mg/dL   Glucose, Bld 140 (H) 70 - 99 mg/dL   Calcium, Ion 1.06 (L) 1.15 - 1.40 mmol/L   TCO2 25 22 - 32 mmol/L   Hemoglobin 9.5 (L) 12.0 -  15.0 g/dL   HCT 28.0 (L) 36.0 - 46.0 %  Hemoglobin and hematocrit, blood     Status: Abnormal   Collection Time: 04/04/19 11:21 AM  Result Value Ref Range   Hemoglobin 8.7 (L) 12.0 - 15.0 g/dL   HCT 26.8 (L) 36.0 - 46.0 %    Comment: Performed at [facility], [address] [place], [place] [address]  Platelet count     Status: Abnormal   Collection Time: 04/04/19 11:21 AM  Result Value Ref Range   Platelets 126 (L) 150 - 400 K/uL    Comment: REPEATED TO VERIFY Performed at [facility], [place] [address], [place], [place] [address]   Fibrinogen     Status: None   Collection Time: 04/04/19 11:21 AM  Result Value Ref Range   Fibrinogen 222 210 - 475 mg/dL    Comment: Performed at [facility] [address] [place], [place] [address]  I-STAT, [place] Clap 8     Status: Abnormal   Collection Time: 04/04/19 12:22 PM  Result Value Ref Range   Sodium 141 135 - 145 mmol/L   Potassium 3.1 (L) 3.5 - 5.1 mmol/L   Chloride 104 98 - 111 mmol/L   BUN 28 (H) 8 - 23 mg/dL   Creatinine, Ser 2.80 (H) 0.44 - 1.00 mg/dL   Glucose, Bld 117 (H) 70 - 99 mg/dL   Calcium, Ion 0.98 (L) 1.15 - 1.40 mmol/L   TCO2 23 22 - 32 mmol/L   Hemoglobin 8.8 (L) 12.0 - 15.0 g/dL   HCT 26.0 (L) 36.0 - 46.0 %  I-STAT 7, (LYTES, BLD GAS, ICA, H+H)     Status: Abnormal   Collection Time: 04/04/19 12:26 PM  Result Value Ref Range   pH, Arterial 7.405 7.350 - 7.450   pCO2 arterial 33.7 32.0 - 48.0 mmHg   pO2, Arterial 388.0 (H) 83.0 - 108.0 mmHg   Bicarbonate 21.1 20.0 - 28.0 mmol/L   TCO2 22 22 - 32 mmol/L   O2 Saturation 100.0 %   Acid-base deficit 3.0 (H) 0.0 - 2.0 mmol/L   Sodium 140 135 - 145 mmol/L   Potassium 3.1 (L) 3.5 - 5.1 mmol/L   Calcium, Ion 0.97 (L) 1.15 - 1.40 mmol/L   HCT 26.0 (L) 36.0 - 46.0 %   Hemoglobin 8.8 (L) 12.0 - 15.0 g/dL   Patient temperature HIDE    Sample type CARDIOPULMONARY  BYPASS   Prepare Pheresed Platelets     Status: None   Collection Time: 04/04/19 12:30 PM   Result Value Ref Range   Unit Number [id number]    Blood Component Type PLTP LR2 PAS    Unit division 00    Status of Unit ISSUED,FINAL    Transfusion Status      OK TO TRANSFUSE Performed at [facility] [address], [place], [place] [address]    Unit Number [id number]    Blood Component Type PLTP LR1 PAS    Unit division 00    Status of Unit ISSUED,FINAL    Transfusion Status OK TO TRANSFUSE   Prepare fresh frozen plasma     Status: None   Collection Time: 04/04/19 12:30 PM  Result Value Ref Range   Unit Number [id number]    Blood Component Type THAWED PLASMA    Unit division 00    Status of Unit ISSUED,FINAL    Transfusion Status      OK TO TRANSFUSE Performed at [facility], [place] [address], [place], [place] [address]    Unit Number [id number]    Blood Component Type THAWED PLASMA    Unit division 00    Status of Unit ISSUED,FINAL    Transfusion Status OK TO TRANSFUSE   Glucose, capillary     Status: Abnormal   Collection Time: 04/04/19  2:34 PM  Result Value Ref Range   Glucose-Capillary 114 (H) 70 - 99 mg/dL  I-STAT 7, (LYTES, BLD GAS, ICA, H+H)     Status: Abnormal   Collection Time: 04/04/19  2:41 PM  Result Value Ref Range   pH, Arterial 7.351 7.350 - 7.450   pCO2 arterial 35.8 32.0 - 48.0 mmHg   pO2, Arterial 91.0 83.0 - 108.0 mmHg   Bicarbonate 20.3 20.0 - 28.0 mmol/L   TCO2 21 (L) 22 - 32 mmol/L   O2 Saturation 97.0 %   Acid-base deficit 5.0 (H) 0.0 - 2.0 mmol/L   Sodium 142 135 - 145 mmol/L   Potassium 2.6 (LL) 3.5 - 5.1 mmol/L   Calcium, Ion 1.04 (L) 1.15 - 1.40 mmol/L   HCT 21.0 (L) 36.0 - 46.0 %   Hemoglobin 7.1 (L) 12.0 - 15.0 g/dL   Patient temperature 34.9 C    Sample type ARTERIAL   CBC     Status: Abnormal   Collection Time: 04/04/19  3:07 PM  Result Value Ref Range   WBC 8.5 4.0 - 10.5 K/uL   RBC 2.51 (L) 3.87 - 5.11 MIL/uL   Hemoglobin 7.4 (L) 12.0 - 15.0 g/dL   HCT 23.0 (L) 36.0 - 46.0 %   MCV 91.6 80.0 -  100.0 fL   MCH 29.5 26.0 - 34.0 pg   MCHC 32.2 30.0 - 36.0 g/dL   RDW 16.7 (H) 11.5 - 15.5 %   Platelets 175 150 - 400 K/uL   nRBC 0.0 0.0 - 0.2 %    Comment: Performed at [facility], [place] [address], [place], [place] [address]  Protime-INR     Status: Abnormal   Collection Time: 04/04/19  3:07 PM  Result Value Ref Range   Prothrombin Time 16.1 (H) 11.4 - 15.2 seconds   INR 1.3 (H) 0.8 - 1.2    Comment: (NOTE) INR goal varies based on device and disease states. Performed at [facility], [place] [address], [place], [place] [address]   APTT     Status: Abnormal  Collection Time: 04/04/19  3:07 PM  Result Value Ref Range   aPTT 40 (H) 24 - 36 seconds    Comment:        IF BASELINE aPTT IS ELEVATED, SUGGEST PATIENT RISK ASSESSMENT BE USED TO DETERMINE APPROPRIATE ANTICOAGULANT THERAPY. Performed at [facility], [place] [address], [place], [place] [address]   Prepare RBC     Status: None   Collection Time: 04/04/19  3:07 PM  Result Value Ref Range   Order Confirmation      ORDER PROCESSED BY BLOOD BANK BB SAMPLE OR UNITS ALREADY AVAILABLE Performed at [facility], [place] [address] [place], [place] [address]   Glucose, capillary     Status: Abnormal   Collection Time: 04/04/19  3:31 PM  Result Value Ref Range   Glucose-Capillary 108 (H) 70 - 99 mg/dL  Glucose, capillary     Status: None   Collection Time: 04/04/19  4:06 PM  Result Value Ref Range   Glucose-Capillary 91 70 - 99 mg/dL  Glucose, capillary     Status: Abnormal   Collection Time: 04/04/19  4:52 PM  Result Value Ref Range   Glucose-Capillary 116 (H) 70 - 99 mg/dL  Glucose, capillary     Status: Abnormal   Collection Time: 04/04/19  6:00 PM  Result Value Ref Range   Glucose-Capillary 116 (H) 70 - 99 mg/dL  Glucose, capillary     Status: Abnormal   Collection Time: 04/04/19  6:53 PM  Result Value Ref Range   Glucose-Capillary 125 (H) 70 - 99 mg/dL  CBC     Status: Abnormal   Collection  Time: 04/04/19  7:40 PM  Result Value Ref Range   WBC 9.5 4.0 - 10.5 K/uL   RBC 3.52 (L) 3.87 - 5.11 MIL/uL   Hemoglobin 10.3 (L) 12.0 - 15.0 g/dL    Comment: REPEATED TO VERIFY POST TRANSFUSION SPECIMEN    HCT 31.6 (L) 36.0 - 46.0 %   MCV 89.8 80.0 - 100.0 fL   MCH 29.3 26.0 - 34.0 pg   MCHC 32.6 30.0 - 36.0 g/dL   RDW 15.9 (H) 11.5 - 15.5 %   Platelets 180 150 - 400 K/uL   nRBC 0.0 0.0 - 0.2 %    Comment: Performed at [facility], [place] [address], [place], [place] [address]  Basic metabolic panel     Status: Abnormal   Collection Time: 04/04/19  7:40 PM  Result Value Ref Range   Sodium 138 135 - 145 mmol/L   Potassium 4.7 3.5 - 5.1 mmol/L    Comment: DELTA CHECK NOTED NO VISIBLE HEMOLYSIS    Chloride 112 (H) 98 - 111 mmol/L   CO2 19 (L) 22 - 32 mmol/L   Glucose, Bld 158 (H) 70 - 99 mg/dL   BUN 25 (H) 8 - 23 mg/dL   Creatinine, Ser 2.78 (H) 0.44 - 1.00 mg/dL   Calcium 7.3 (L) 8.9 - 10.3 mg/dL   GFR calc non Af Amer 17 (L) >60 mL/min   GFR calc Af Amer 20 (L) >60 mL/min   Anion gap 7 5 - 15    Comment: Performed at [facility], [place] [address] [place], [place] [address]  Magnesium     Status: None   Collection Time: 04/04/19  7:40 PM  Result Value Ref Range   Magnesium 2.1 1.7 - 2.4 mg/dL    Comment: Performed at [facility], [place] [address],  [place], [place] [address]  Glucose, capillary     Status: Abnormal   Collection Time: 04/04/19  8:06 PM  Result Value Ref Range   Glucose-Capillary 146 (H) 70 - 99 mg/dL  Glucose, capillary     Status: Abnormal   Collection Time: 04/04/19  9:08 PM  Result Value Ref Range   Glucose-Capillary 110 (H) 70 - 99 mg/dL  Glucose, capillary     Status: Abnormal   Collection Time: 04/04/19 10:11 PM  Result Value Ref Range   Glucose-Capillary 118 (H) 70 - 99 mg/dL  Glucose, capillary     Status: Abnormal   Collection Time: 04/04/19 11:06 PM  Result Value Ref Range   Glucose-Capillary 101 (H) 70 - 99 mg/dL  Glucose,  capillary     Status: None   Collection Time: 04/05/19  1:03 AM  Result Value Ref Range   Glucose-Capillary 95 70 - 99 mg/dL  Glucose, capillary     Status: Abnormal   Collection Time: 04/05/19  2:10 AM  Result Value Ref Range   Glucose-Capillary 104 (H) 70 - 99 mg/dL  Glucose, capillary     Status: None   Collection Time: 04/05/19  3:28 AM  Result Value Ref Range   Glucose-Capillary 90 70 - 99 mg/dL  I-STAT 7, (LYTES, BLD GAS, ICA, H+H)     Status: Abnormal   Collection Time: 04/05/19  3:28 AM  Result Value Ref Range   pH, Arterial 7.280 (L) 7.350 - 7.450   pCO2 arterial 37.2 32.0 - 48.0 mmHg   pO2, Arterial 78.0 (L) 83.0 - 108.0 mmHg   Bicarbonate 17.5 (L) 20.0 - 28.0 mmol/L   TCO2 19 (L) 22 - 32 mmol/L   O2 Saturation 94.0 %   Acid-base deficit 9.0 (H) 0.0 - 2.0 mmol/L   Sodium 143 135 - 145 mmol/L   Potassium 4.2 3.5 - 5.1 mmol/L   Calcium, Ion 1.10 (L) 1.15 - 1.40 mmol/L   HCT 24.0 (L) 36.0 - 46.0 %   Hemoglobin 8.2 (L) 12.0 - 15.0 g/dL   Patient temperature 36.8 C    Sample type ARTERIAL   CBC     Status: Abnormal   Collection Time: 04/05/19  4:42 AM  Result Value Ref Range   WBC 10.2 4.0 - 10.5 K/uL   RBC 2.88 (L) 3.87 - 5.11 MIL/uL   Hemoglobin 8.5 (L) 12.0 - 15.0 g/dL    Comment: REPEATED TO VERIFY   HCT 25.9 (L) 36.0 - 46.0 %   MCV 89.9 80.0 - 100.0 fL   MCH 29.5 26.0 - 34.0 pg   MCHC 32.8 30.0 - 36.0 g/dL   RDW 16.7 (H) 11.5 - 15.5 %   Platelets 129 (L) 150 - 400 K/uL    Comment: REPEATED TO VERIFY SPECIMEN CHECKED FOR CLOTS    nRBC 0.0 0.0 - 0.2 %    Comment: Performed at [facility], [place] [address], [place], [place] [address]  Basic metabolic panel     Status: Abnormal   Collection Time: 04/05/19  4:42 AM  Result Value Ref Range   Sodium 140 135 - 145 mmol/L   Potassium 4.4 3.5 - 5.1 mmol/L   Chloride 112 (H) 98 - 111 mmol/L   CO2 18 (L) 22 - 32 mmol/L   Glucose, Bld 114 (H) 70 - 99 mg/dL   BUN 30 (H) 8 - 23 mg/dL   Creatinine, Ser 3.16 (H)  0.44 - 1.00 mg/dL   Calcium 7.6 (L) 8.9 - 10.3  mg/dL   GFR calc non Af Amer 15 (L) >60 mL/min   GFR calc Af Amer 17 (L) >60 mL/min   Anion gap 10 5 - 15    Comment: Performed at [facility] [address], [place], [place] [address]  Magnesium     Status: None   Collection Time: 04/05/19  4:42 AM  Result Value Ref Range   Magnesium 2.0 1.7 - 2.4 mg/dL    Comment: Performed at [facility] [address], [place], [place] [address]  Glucose, capillary     Status: Abnormal   Collection Time: 04/05/19  5:00 AM  Result Value Ref Range   Glucose-Capillary 120 (H) 70 - 99 mg/dL  Glucose, capillary     Status: Abnormal   Collection Time: 04/05/19  6:22 AM  Result Value Ref Range   Glucose-Capillary 126 (H) 70 - 99 mg/dL   Comment 1 QC Due   Glucose, capillary     Status: Abnormal   Collection Time: 04/05/19  7:29 AM  Result Value Ref Range   Glucose-Capillary 116 (H) 70 - 99 mg/dL  Glucose, capillary     Status: None   Collection Time: 04/05/19  8:36 AM  Result Value Ref Range   Glucose-Capillary 70 70 - 99 mg/dL  Glucose, capillary     Status: Abnormal   Collection Time: 04/05/19  8:52 AM  Result Value Ref Range   Glucose-Capillary 119 (H) 70 - 99 mg/dL  I-STAT 7, (LYTES, BLD GAS, ICA, H+H)     Status: Abnormal   Collection Time: 04/05/19  9:44 AM  Result Value Ref Range   pH, Arterial 7.313 (L) 7.350 - 7.450   pCO2 arterial 32.1 32.0 - 48.0 mmHg   pO2, Arterial 124.0 (H) 83.0 - 108.0 mmHg   Bicarbonate 16.3 (L) 20.0 - 28.0 mmol/L   TCO2 17 (L) 22 - 32 mmol/L   O2 Saturation 99.0 %   Acid-base deficit 9.0 (H) 0.0 - 2.0 mmol/L   Sodium 142 135 - 145 mmol/L   Potassium 3.6 3.5 - 5.1 mmol/L   Calcium, Ion 1.08 (L) 1.15 - 1.40 mmol/L   HCT 22.0 (L) 36.0 - 46.0 %   Hemoglobin 7.5 (L) 12.0 - 15.0 g/dL   Patient temperature 36.8 C    Sample type ARTERIAL   Glucose, capillary     Status: Abnormal   Collection Time: 04/05/19  9:46 AM  Result Value Ref Range    Glucose-Capillary 153 (H) 70 - 99 mg/dL  Glucose, capillary     Status: Abnormal   Collection Time: 04/05/19 10:47 AM  Result Value Ref Range   Glucose-Capillary 123 (H) 70 - 99 mg/dL  I-STAT 7, (LYTES, BLD GAS, ICA, H+H)     Status: Abnormal   Collection Time: 04/05/19 10:47 AM  Result Value Ref Range   pH, Arterial 7.386 7.350 - 7.450   pCO2 arterial 27.0 (L) 32.0 - 48.0 mmHg   pO2, Arterial 104.0 83.0 - 108.0 mmHg   Bicarbonate 16.2 (L) 20.0 - 28.0 mmol/L   TCO2 17 (L) 22 - 32 mmol/L   O2 Saturation 98.0 %   Acid-base deficit 8.0 (H) 0.0 - 2.0 mmol/L   Sodium 146 (H) 135 - 145 mmol/L   Potassium 2.6 (LL) 3.5 - 5.1 mmol/L   Calcium, Ion 0.90 (L) 1.15 - 1.40 mmol/L   HCT 18.0 (L) 36.0 - 46.0 %   Hemoglobin 6.1 (LL) 12.0 - 15.0 g/dL   Patient temperature 37.0  C    Sample type ARTERIAL   CBC     Status: Abnormal   Collection Time: 04/05/19 10:53 AM  Result Value Ref Range   WBC 11.7 (H) 4.0 - 10.5 K/uL   RBC 2.82 (L) 3.87 - 5.11 MIL/uL   Hemoglobin 8.3 (L) 12.0 - 15.0 g/dL    Comment: REPEATED TO VERIFY   HCT 24.6 (L) 36.0 - 46.0 %   MCV 87.2 80.0 - 100.0 fL   MCH 29.4 26.0 - 34.0 pg   MCHC 33.7 30.0 - 36.0 g/dL   RDW 16.7 (H) 11.5 - 15.5 %   Platelets 116 (L) 150 - 400 K/uL    Comment: REPEATED TO VERIFY PLATELET COUNT CONFIRMED BY SMEAR Immature Platelet Fraction may be clinically indicated, consider ordering this additional test [id number]    nRBC 0.0 0.0 - 0.2 %    Comment: Performed at [facility], [place] [address], [place], [place] [address]  Basic metabolic panel     Status: Abnormal   Collection Time: 04/05/19 10:53 AM  Result Value Ref Range   Sodium 142 135 - 145 mmol/L   Potassium 3.8 3.5 - 5.1 mmol/L   Chloride 108 98 - 111 mmol/L   CO2 23 22 - 32 mmol/L   Glucose, Bld 174 (H) 70 - 99 mg/dL   BUN 29 (H) 8 - 23 mg/dL   Creatinine, Ser 3.11 (H) 0.44 - 1.00 mg/dL   Calcium 7.8 (L) 8.9 - 10.3 mg/dL   GFR calc non Af Amer 15 (L) >60 mL/min   GFR calc  Af Amer 17 (L) >60 mL/min   Anion gap 11 5 - 15    Comment: Performed at [place] [address], [place], [place] [address]  Glucose, capillary     Status: Abnormal   Collection Time: 04/05/19 11:55 AM  Result Value Ref Range   Glucose-Capillary 159 (H) 70 - 99 mg/dL  I-STAT 7, (LYTES, BLD GAS, ICA, H+H)     Status: Abnormal   Collection Time: 04/05/19 12:15 PM  Result Value Ref Range   pH, Arterial 7.347 (L) 7.350 - 7.450   pCO2 arterial 38.6 32.0 - 48.0 mmHg   pO2, Arterial 76.0 (L) 83.0 - 108.0 mmHg   Bicarbonate 21.3 20.0 - 28.0 mmol/L   TCO2 22 22 - 32 mmol/L   O2 Saturation 95.0 %   Acid-base deficit 4.0 (H) 0.0 - 2.0 mmol/L   Sodium 143 135 - 145 mmol/L   Potassium 3.4 (L) 3.5 - 5.1 mmol/L   Calcium, Ion 1.06 (L) 1.15 - 1.40 mmol/L   HCT 23.0 (L) 36.0 - 46.0 %   Hemoglobin 7.8 (L) 12.0 - 15.0 g/dL   Patient temperature 36.5 C    Sample type ARTERIAL   I-STAT 7, (LYTES, BLD GAS, ICA, H+H)     Status: Abnormal   Collection Time: 04/05/19  1:13 PM  Result Value Ref Range   pH, Arterial 7.216 (L) 7.350 - 7.450   pCO2 arterial 46.4 32.0 - 48.0 mmHg   pO2, Arterial 59.0 (L) 83.0 - 108.0 mmHg   Bicarbonate 19.0 (L) 20.0 - 28.0 mmol/L   TCO2 20 (L) 22 - 32 mmol/L   O2 Saturation 86.0 %   Acid-base deficit 8.0 (H) 0.0 - 2.0 mmol/L   Sodium 143 135 - 145 mmol/L   Potassium 3.9 3.5 - 5.1 mmol/L   Calcium, Ion 1.08 (L) 1.15 - 1.40 mmol/L   HCT 26.0 (L) 36.0 - 46.0 %  Hemoglobin 8.8 (L) 12.0 - 15.0 g/dL   Patient temperature 36.2 C    Sample type CARDIOPULMONARY BYPASS   Glucose, capillary     Status: Abnormal   Collection Time: 04/05/19  1:23 PM  Result Value Ref Range   Glucose-Capillary 155 (H) 70 - 99 mg/dL  Glucose, capillary     Status: Abnormal   Collection Time: 04/05/19  2:16 PM  Result Value Ref Range   Glucose-Capillary 123 (H) 70 - 99 mg/dL  I-STAT 7, (LYTES, BLD GAS, ICA, H+H)     Status: Abnormal   Collection Time: 04/05/19  2:19 PM  Result  Value Ref Range   pH, Arterial 7.273 (L) 7.350 - 7.450   pCO2 arterial 44.7 32.0 - 48.0 mmHg   pO2, Arterial 158.0 (H) 83.0 - 108.0 mmHg   Bicarbonate 20.7 20.0 - 28.0 mmol/L   TCO2 22 22 - 32 mmol/L   O2 Saturation 99.0 %   Acid-base deficit 6.0 (H) 0.0 - 2.0 mmol/L   Sodium 144 135 - 145 mmol/L   Potassium 3.5 3.5 - 5.1 mmol/L   Calcium, Ion 1.06 (L) 1.15 - 1.40 mmol/L   HCT 22.0 (L) 36.0 - 46.0 %   Hemoglobin 7.5 (L) 12.0 - 15.0 g/dL   Patient temperature 37.0 C    Sample type ARTERIAL   Glucose, capillary     Status: None   Collection Time: 04/05/19  3:01 PM  Result Value Ref Range   Glucose-Capillary 87 70 - 99 mg/dL  Prepare RBC     Status: None   Collection Time: 04/05/19  3:31 PM  Result Value Ref Range   Order Confirmation      ORDER PROCESSED BY BLOOD BANK Performed at [facility], [address], [place], [place] [address]   Glucose, capillary     Status: None   Collection Time: 04/05/19  4:10 PM  Result Value Ref Range   Glucose-Capillary 89 70 - 99 mg/dL  Basic metabolic panel     Status: Abnormal   Collection Time: 04/05/19  4:28 PM  Result Value Ref Range   Sodium 142 135 - 145 mmol/L   Potassium 4.3 3.5 - 5.1 mmol/L    Comment: SLIGHT HEMOLYSIS   Chloride 107 98 - 111 mmol/L   CO2 21 (L) 22 - 32 mmol/L   Glucose, Bld 91 70 - 99 mg/dL   BUN 33 (H) 8 - 23 mg/dL   Creatinine, Ser 3.54 (H) 0.44 - 1.00 mg/dL   Calcium 7.8 (L) 8.9 - 10.3 mg/dL   GFR calc non Af Amer 13 (L) >60 mL/min   GFR calc Af Amer 15 (L) >60 mL/min   Anion gap 14 5 - 15    Comment: Performed at [facility], [place] [address], [place], [place] [address]  Magnesium     Status: None   Collection Time: 04/05/19  4:28 PM  Result Value Ref Range   Magnesium 2.1 1.7 - 2.4 mg/dL    Comment: Performed at [facility], [place] [address], [place], [place] [address]  CBC     Status: Abnormal   Collection Time: 04/05/19  4:28 PM  Result Value Ref Range   WBC 12.4 (H) 4.0 - 10.5  K/uL   RBC 2.89 (L) 3.87 - 5.11 MIL/uL   Hemoglobin 8.6 (L) 12.0 - 15.0 g/dL   HCT 25.8 (L) 36.0 - 46.0 %   MCV 89.3 80.0 - 100.0 fL   MCH 29.8 26.0 -  34.0 pg   MCHC 33.3 30.0 - 36.0 g/dL   RDW 17.2 (H) 11.5 - 15.5 %   Platelets 116 (L) 150 - 400 K/uL    Comment: REPEATED TO VERIFY Immature Platelet Fraction may be clinically indicated, consider ordering this additional test [id number] CONSISTENT WITH PREVIOUS RESULT    nRBC 0.0 0.0 - 0.2 %    Comment: Performed at [facility], [place] [address], [place], [place] [address]  Hemoglobin and hematocrit, blood     Status: Abnormal   Collection Time: 04/05/19  7:30 PM  Result Value Ref Range   Hemoglobin 11.1 (L) 12.0 - 15.0 g/dL    Comment: REPEATED TO VERIFY POST TRANSFUSION SPECIMEN    HCT 32.4 (L) 36.0 - 46.0 %    Comment: Performed at [place] [address] [place], [place] [address]  Glucose, capillary     Status: Abnormal   Collection Time: 04/05/19  8:00 PM  Result Value Ref Range   Glucose-Capillary 135 (H) 70 - 99 mg/dL  Glucose, capillary     Status: Abnormal   Collection Time: 04/05/19 11:34 PM  Result Value Ref Range   Glucose-Capillary 136 (H) 70 - 99 mg/dL  Basic metabolic panel     Status: Abnormal   Collection Time: 04/06/19  2:50 AM  Result Value Ref Range   Sodium 141 135 - 145 mmol/L   Potassium 4.7 3.5 - 5.1 mmol/L   Chloride 108 98 - 111 mmol/L   CO2 20 (L) 22 - 32 mmol/L   Glucose, Bld 125 (H) 70 - 99 mg/dL   BUN 42 (H) 8 - 23 mg/dL   Creatinine, Ser 4.15 (H) 0.44 - 1.00 mg/dL   Calcium 7.5 (L) 8.9 - 10.3 mg/dL   GFR calc non Af Amer 10 (L) >60 mL/min   GFR calc Af Amer 12 (L) >60 mL/min   Anion gap 13 5 - 15    Comment: Performed at [place] [address], [place], [place] [address]  CBC     Status: Abnormal   Collection Time: 04/06/19  2:50 AM  Result Value Ref Range   WBC 12.5 (H) 4.0 - 10.5 K/uL   RBC 3.55 (L) 3.87 - 5.11 MIL/uL   Hemoglobin 10.7 (L) 12.0 - 15.0  g/dL   HCT 32.1 (L) 36.0 - 46.0 %   MCV 90.4 80.0 - 100.0 fL   MCH 30.1 26.0 - 34.0 pg   MCHC 33.3 30.0 - 36.0 g/dL   RDW 16.8 (H) 11.5 - 15.5 %   Platelets 91 (L) 150 - 400 K/uL    Comment: Immature Platelet Fraction may be clinically indicated, consider ordering this additional test [id number] CONSISTENT WITH PREVIOUS RESULT    nRBC 0.0 0.0 - 0.2 %    Comment: Performed at [facility], [place] [address] [place], [place] [address]  Magnesium     Status: None   Collection Time: 04/06/19  2:50 AM  Result Value Ref Range   Magnesium 2.2 1.7 - 2.4 mg/dL    Comment: Performed at [place] [address], [place], [place] [address]  Phosphorus     Status: Abnormal   Collection Time: 04/06/19  2:50 AM  Result Value Ref Range   Phosphorus 9.3 (H) 2.5 - 4.6 mg/dL    Comment: Performed at [facility] [address], [place], [place] [address]  Glucose, capillary     Status: None  Collection Time: 04/06/19  2:55 AM  Result Value Ref Range   Glucose-Capillary 77 70 - 99 mg/dL  I-STAT 7, (LYTES, BLD GAS, ICA, H+H)     Status: Abnormal   Collection Time: 04/06/19  6:18 AM  Result Value Ref Range   pH, Arterial 7.021 (LL) 7.350 - 7.450   pCO2 arterial 81.0 (HH) 32.0 - 48.0 mmHg   pO2, Arterial 80.0 (L) 83.0 - 108.0 mmHg   Bicarbonate 21.0 20.0 - 28.0 mmol/L   TCO2 23 22 - 32 mmol/L   O2 Saturation 87.0 %   Acid-base deficit 11.0 (H) 0.0 - 2.0 mmol/L   Sodium 142 135 - 145 mmol/L   Potassium 6.0 (H) 3.5 - 5.1 mmol/L   Calcium, Ion 1.06 (L) 1.15 - 1.40 mmol/L   HCT 33.0 (L) 36.0 - 46.0 %   Hemoglobin 11.2 (L) 12.0 - 15.0 g/dL   Patient temperature 37.0 C    Sample type ARTERIAL    Comment NOTIFIED PHYSICIAN   .Cooxemetry Panel (carboxy, met, total hgb, O2 sat)     Status: None   Collection Time: 04/06/19  7:42 AM  Result Value Ref Range   Total hemoglobin 14.5 12.0 - 16.0 g/dL   O2 Saturation 74.4 %   Carboxyhemoglobin 0.6 0.5 - 1.5 %   Methemoglobin 0.8 0.0  - 1.5 %    Comment: Performed at [facility], [place] [address], [place], [place] [address]  Glucose, capillary     Status: Abnormal   Collection Time: 04/06/19  8:29 AM  Result Value Ref Range   Glucose-Capillary 116 (H) 70 - 99 mg/dL     ROS:  Review of systems not obtained due to patient factors.  Physical Exam: Vitals:   04/06/19 0835 04/06/19 0900  BP:  127/62  Pulse:    Resp:  16  Temp: (!) 96.2 F (35.7 C)   SpO2:       General exam: laying in bed, on bipap. Responded to name. euvolemic on exam.  Respiratory system: no crackles or wheezing heard on exam, although difficult to auscultate over sound of bipap.  Cardiovascular system: regular rhythm, normal rate. No murmurs. No pedal edema.  Gastrointestinal system: Abdomen is nondistended, soft and nontender. Normal bowel sounds heard. Central nervous system: alert, unable to communicate verbally d/t bipap but appears to be oriented to person, place, and situation.  Extremities: patient in restraints. Unable to assess strength.  Skin: No rashes, lesions or ulcers Psychiatry: Judgement and insight appear normal. Not agitated.   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 CKD probably from long standing poorly controlled HTN.  Pt started on IV lasix drip yesterday, with subsequent worsening of urinary output (<115m over the past 18 hours) and increase in K to 6.0.  Patient appears euvolemic on exam.  Will continue IV lasix and give metolazone then recheck kidney function in the afternoon.  Very likely pt will need dialysis at some point soon.   2. combined severe metabolic, respiratory acidosis - s/p intubation patient became agitated, although appears calm now.  She was found to have a pH of 7.021 this am, down from 7.27 yesterday afternoon along with a pCO2 of 80, which was normal prior to this. During this time her CO2 has also decrased from 24 to 20,  indicating an underlying metabolic acidosis as well.  Pt has become oliguric during this time, with UOP of  < 1074min past ~18  hrs despite being on lasix drip during that time. Cr increased from 3.36  To 4.15 and K has increased to 6.0. acidosis should improve with bipap, as it appears to be mainly respiratory.   3. Thoracic aortic dissection - s/p surgical repair.  Being managed in ICU by cardiothoracic surgery team. Pt on dopamine, levophed, and precedex drip. Currently on bipap.   4.Hypertension/volume  - normotensive while on levophed, dopamine drips.   5. Anemia  - currently stable.  S/p 3 U pRBC, 2 U FFP  See attending attestation for final assessment and plan.   Benay Pike 04/06/2019, 10:06 AM  PGY2

## 2019-04-06 NOTE — Consult Note (Deleted)
[facility] Nephrology Consultation Note  Requesting MD: Dr. Orvan Seen Reason for consult: combined respiratory, metabolic acidosis  HPI:  Monique Henderson is a 68 y.o. female. 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  Is now s/p  Surgical repair w/ graft placement. Pt became agitated after extubation and had desaturatoins into the 70s on [place].  Is now on bipap, with O2 sats 100%. Patient has minimal urinary output since yesterday afternoon despite lasix drip. Pt found to have acidosis on ABG of 7.021 this morning. Patient unable to give history d/t bipap but is able to nod head appropriately to questions and states she has no complaints or concerns.   Creatinine  Date/Time Value Ref Range Status  09/28/2012 08:31 AM 1.2 (H) 0.6 - 1.1 mg/dL Final   Creat  Date/Time Value Ref Range Status  01/18/2017 11:45 AM 2.09 (H) 0.50 - 0.99 mg/dL Final    Comment:      For patients > or = 66 years of age: The upper reference limit for Creatinine is approximately 13% higher for people identified as African-American.     12/09/2016 02:58 PM 2.23 (H) 0.50 - 0.99 mg/dL Final    Comment:      For patients > or = 66 years of age: The upper reference limit for Creatinine is approximately 13% higher for people identified as African-American.     02/14/2014 09:51 AM 1.24 (H) 0.50 - 1.10 mg/dL Final  08/28/2013 10:21 AM 1.52 (H) 0.50 - 1.10 mg/dL Final  02/22/2013 11:11 AM 1.33 (H) 0.50 - 1.10 mg/dL Final  12/21/2012 09:57 AM 1.38 (H) 0.50 - 1.10 mg/dL Final  10/12/2012 12:13 PM 2.44 (H) 0.50 - 1.10 mg/dL Final  04/20/2012 12:20 PM 1.66 (H) 0.50 - 1.10 mg/dL Final  02/25/2011 09:46 AM 1.43 (H) 0.50 - 1.10 mg/dL Final   Creatinine, Ser  Date/Time Value Ref Range Status  04/06/2019 02:50 AM 4.15 (H) 0.44 - 1.00 mg/dL Final  04/05/2019 04:28 PM 3.54 (H) 0.44 - 1.00 mg/dL Final  04/05/2019 10:53 AM  3.11 (H) 0.44 - 1.00 mg/dL Final  04/05/2019 04:42 AM 3.16 (H) 0.44 - 1.00 mg/dL Final  04/04/2019 07:40 PM 2.78 (H) 0.44 - 1.00 mg/dL Final  04/04/2019 12:22 PM 2.80 (H) 0.44 - 1.00 mg/dL Final  04/04/2019 11:17 AM 2.70 (H) 0.44 - 1.00 mg/dL Final  04/04/2019 10:08 AM 2.80 (H) 0.44 - 1.00 mg/dL Final  04/04/2019 09:49 AM 3.30 (H) 0.44 - 1.00 mg/dL Final  04/04/2019 08:25 AM 3.30 (H) 0.44 - 1.00 mg/dL Final  04/03/2019 06:22 PM 3.36 (H) 0.44 - 1.00 mg/dL Final  02/24/2019 12:44 PM 2.39 (H) 0.57 - 1.00 mg/dL Final  05/04/2018 09:22 AM 2.65 (H) 0.57 - 1.00 mg/dL Final  12/22/2017 10:39 AM 2.86 (H) 0.57 - 1.00 mg/dL Final  08/16/2017 11:09 AM 1.46 (H) 0.57 - 1.00 mg/dL Final  06/23/2017 12:20 PM 1.57 (H) 0.57 - 1.00 mg/dL Final  03/14/2015 11:20 AM 1.95 (H) 0.44 - 1.00 mg/dL Final  01/25/2014 06:47 PM 1.73 (H) 0.50 - 1.10 mg/dL Final  01/08/2014 10:51 AM 1.40 (H) 0.50 - 1.10 mg/dL Final  12/05/2013 10:11 AM 1.38 (H) 0.50 - 1.10 mg/dL Final  03/21/2013 03:00 PM 1.53 (H) 0.50 - 1.10 mg/dL Final  03/01/2013 10:55 AM 1.07 0.50 - 1.10 mg/dL Final  03/01/2013 05:20 AM 1.12 (H) 0.50 - 1.10 mg/dL Final  02/27/2013 06:19 PM 1.32 (H)  0.50 - 1.10 mg/dL Final  10/27/2012 06:15 AM 1.65 (H) 0.50 - 1.10 mg/dL Final  10/13/2012 11:29 AM 1.30 (H) 0.50 - 1.10 mg/dL Final  12/18/2009 07:18 PM 1.36 (H) (0.40-1.20 mg/dL Final  10/30/2009 07:44 PM 1.26 (H) (0.40-1.20 mg/dL Final  07/17/2009 06:19 PM 1.56 (H) (0.40-1.20 mg/dL Final  05/23/2009 12:56 PM 1.23 (H) 0.4 - 1.2 mg/dL Final  01/17/2009 10:49 PM 1.29 (H) (0.40-1.20 mg/dL Final  09/20/2008 08:21 PM 1.24 (H) 0.40 - 1.20 mg/dL Final  08/29/2008 08:17 PM 1.28 (H) 0.40 - 1.20 mg/dL Final  06/07/2008 07:55 PM 1.16 0.40 - 1.20 mg/dL Final  12/23/2007 08:49 PM 1.47 (H) 0.40 - 1.20 mg/dL Final  11/30/2007 08:27 PM 1.43 (H) 0.40 - 1.20 mg/dL Final  10/31/2007 09:45 PM 1.68 (H) 0.40 - 1.20 mg/dL Final  10/07/2007 08:48 PM 1.51 (H) 0.40 - 1.20 mg/dL Final   12/02/2006 08:49 PM 1.24 (H) 0.40 - 1.20 mg/dL Final  06/10/2006 09:34 PM 0.87 0.40 - 1.20 mg/dL Final     PMHx:   Past Medical History:  Diagnosis Date  . Atelectasis 2002   Bilateral  . Bone spur 2008   Right calcaneal foot spur  . Breast cancer ([place]) 2004   Ductal carcinoma in situ of the left breast; S/P left partial mastectomy 02/26/2003; S/P re-excision of cranial and lateral margins11/18/2004.radiation  . Breast cancer ([place]) 09/21/2012   right breast/ last radiation treatment 03/22/2013  . Chronic kidney disease, stage IV (severe) ([place]) 10/10/2007  . 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 Autumn Messing; S/P re-excision of superior and inferior margins 10/27/2012.   Marland Kitchen GERD (gastroesophageal reflux disease)   . 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 Hot flashes   . Hx of radiation therapy 2005   left breast  . Hx of radiation therapy 01/11/13- 03/22/13   right breast 5760 cGy 30 sessions  . Hypertension   . Low back pain   . Lumbar spinal stenosis    S/P lumbar decompressive laminectomy, fusion and plating for lumbar spinal stensosis  . Normocytic anemia    With thrombocytosis  . Osteoarthritis   . Personal history of radiation therapy   . Right ureteral stone 2002  . Shortness of breath    from pain  . Uterine fibroid   . Wears dentures    top    Past Surgical History:  Procedure Laterality Date  . ANTERIOR LAT LUMBAR FUSION N/A 01/18/2014   Procedure: ANTERIOR LATERAL LUMBAR FUSION LUMBAR TWO-THREE;  Surgeon: Eustace Moore, MD;  Location: [facility];  Service: Neurosurgery;  Laterality: N/A;  . ANTERIOR LUMBAR FUSION  01/18/2014  . BACK SURGERY    . BREAST LUMPECTOMY Left 01/2003  . BREAST LUMPECTOMY Right 2014  . BREAST LUMPECTOMY WITH NEEDLE LOCALIZATION AND AXILLARY SENTINEL LYMPH NODE BX Right 10/13/2012   Procedure: BREAST LUMPECTOMY WITH NEEDLE LOCALIZATION;  Surgeon: [name], MD;  Location: [facility];  Service: General;  Laterality: Right;  Right breast wire localized lumpectomy  . LAMINECTOMY  05/27/2009   Lumbar decompressive laminectomy, fusion and plating for lumbar spinal stensosis  . LUMBAR LAMINECTOMY/DECOMPRESSION MICRODISCECTOMY Left 03/23/2013   Procedure: LUMBAR LAMINECTOMY/DECOMPRESSION MICRODISCECTOMY 1 LEVEL;  Surgeon: Eustace Moore, MD;  Location: [facility];  Service: Neurosurgery;  Laterality: Left;  LUMBAR LAMINECTOMY/DECOMPRESSION MICRODISCECTOMY 1 LEVEL  . MASTECTOMY, PARTIAL Left 02/26/2003   ; S/P  re-excision of cranial and lateral margins 04/19/2003.   Marland Kitchen RE-EXCISION OF BREAST CANCER,SUPERIOR MARGINS Right 10/27/2012   Procedure: RE-EXCISION OF BREAST CANCER,SUPERIOR and inferior MARGINS;  Surgeon: [name], MD;  Location: [facility];  Service: General;  Laterality: Right;  . RE-EXCISION OF BREAST LUMPECTOMY Left 04/2003  . TEE WITHOUT CARDIOVERSION N/A 04/04/2019   Procedure: Transesophageal Echocardiogram (Tee);  Surgeon: Wonda Olds, MD;  Location: [facility];  Service: Open Heart Surgery;  Laterality: N/A;  . THORACIC AORTIC ANEURYSM REPAIR N/A 04/04/2019   Procedure: THORACIC ASCENDING ANEURYSM REPAIR (AAA)  USING 28 MM X 30 CM HEMASHIELD PLATINUM VASCULAR GRAFT;  Surgeon: Wonda Olds, MD;  [facility];  Service: Open Heart Surgery;  Laterality: N/A;    Family Hx:  Family History  Problem Relation Age of Onset  . Colon cancer Mother 61  . Hypertension Mother   . Diabetes Sister 17  . Hypertension Sister   . Diabetes Brother   . Hypertension Brother   . Diabetes Brother   . Hypertension Brother   . Kidney disease Son        On dialysis  . Hypertension Son   . Diabetes Son   . Multiple sclerosis Son   . Bone cancer Sister 53  . Breast cancer Neg Hx   . Cervical cancer Neg Hx     Social History:  reports that she has been smoking cigarettes. She has a 11.00 pack-year smoking history. She  has never used smokeless tobacco. She reports current alcohol use of about 2.0 standard drinks of alcohol per week. She reports current drug use. Drug: Cocaine.  Allergies:  Allergies  Allergen Reactions  . Shrimp [Shellfish Allergy] Shortness Of Breath  . Bactroban [Mupirocin] Other (See Comments)    "Sores in nose"  . Vicodin [Hydrocodone-Acetaminophen] Itching and Nausea And Vomiting    This is patient's home medication  . Lisinopril Cough  . Tylenol [Acetaminophen] Itching    Medications: Prior to Admission medications   Medication Sig Start Date End Date Taking? Authorizing Provider  albuterol (VENTOLIN HFA) 108 (90 Base) MCG/ACT inhaler Inhale 2 puffs into the lungs every 6 (six) hours as needed for wheezing or shortness of breath. 02/02/19  Yes [name], MD  amLODipine (NORVASC) 10 MG tablet TAKE 1 TABLET (10 MG TOTAL) BY MOUTH DAILY. 09/20/18  Yes [name], MD  carvedilol (COREG) 12.5 MG tablet TAKE 1 TABLET (12.5 MG TOTAL) BY MOUTH 2 (TWO) TIMES DAILY WITH A MEAL. 03/22/18  Yes [name], MD  cloNIDine (CATAPRES) 0.2 MG tablet Take 1 tablet (0.2 mg total) by mouth 2 (two) times daily. 05/04/18  Yes [name], MD  famotidine (PEPCID) 10 MG tablet Take 1 tablet (10 mg total) by mouth at bedtime. 05/04/18  Yes [name], MD  pregabalin (LYRICA) 75 MG capsule TAKE 1 CAPSULE (75 MG TOTAL) BY MOUTH 2 (TWO) TIMES DAILY. 09/15/18  Yes Forrest Moron, MD  oxyCODONE-acetaminophen (PERCOCET) 5-325 MG tablet Take 1 tablet by mouth every 6 (six) hours as needed for severe pain. Patient not taking: Reported on 04/03/2019 09/29/17   Edrick Kins, DPM    I have reviewed the patient's current medications.  Labs:  Results for orders placed or performed during the hospital encounter of 04/03/19 (from the past 48 hour(s))  I-STAT, chem 8     Status: Abnormal   Collection Time: 04/04/19  9:49 AM  Result Value Ref Range   Sodium  138 135 - 145 mmol/L   Potassium 2.7 (LL) 3.5 -  5.1 mmol/L   Chloride 100 98 - 111 mmol/L   BUN 26 (H) 8 - 23 mg/dL   Creatinine, Ser 3.30 (H) 0.44 - 1.00 mg/dL   Glucose, Bld 145 (H) 70 - 99 mg/dL   Calcium, Ion 1.15 1.15 - 1.40 mmol/L   TCO2 25 22 - 32 mmol/L   Hemoglobin 7.8 (L) 12.0 - 15.0 g/dL   HCT 23.0 (L) 36.0 - 46.0 %  I-STAT, chem 8     Status: Abnormal   Collection Time: 04/04/19 10:08 AM  Result Value Ref Range   Sodium 140 135 - 145 mmol/L   Potassium 2.9 (L) 3.5 - 5.1 mmol/L   Chloride 97 (L) 98 - 111 mmol/L   BUN 24 (H) 8 - 23 mg/dL   Creatinine, Ser 2.80 (H) 0.44 - 1.00 mg/dL   Glucose, Bld 163 (H) 70 - 99 mg/dL   Calcium, Ion 0.97 (L) 1.15 - 1.40 mmol/L   TCO2 25 22 - 32 mmol/L   Hemoglobin 8.8 (L) 12.0 - 15.0 g/dL   HCT 26.0 (L) 36.0 - 46.0 %  I-STAT 7, (LYTES, BLD GAS, ICA, H+H)     Status: Abnormal   Collection Time: 04/04/19 10:22 AM  Result Value Ref Range   pH, Arterial 7.388 7.350 - 7.450   pCO2 arterial 43.4 32.0 - 48.0 mmHg   pO2, Arterial 566.0 (H) 83.0 - 108.0 mmHg   Bicarbonate 26.2 20.0 - 28.0 mmol/L   TCO2 27 22 - 32 mmol/L   O2 Saturation 100.0 %   Acid-Base Excess 1.0 0.0 - 2.0 mmol/L   Sodium 138 135 - 145 mmol/L   Potassium 3.8 3.5 - 5.1 mmol/L   Calcium, Ion 1.00 (L) 1.15 - 1.40 mmol/L   HCT 25.0 (L) 36.0 - 46.0 %   Hemoglobin 8.5 (L) 12.0 - 15.0 g/dL   Patient temperature HIDE    Sample type CARDIOPULMONARY BYPASS   Surgical pathology     Status: None   Collection Time: 04/04/19 10:26 AM  Result Value Ref Range   SURGICAL PATHOLOGY      SURGICAL PATHOLOGY CASE: [id number] PATIENT: Masako Simones Surgical Pathology Report     Clinical History: Type A aneurysm dissection (cm)     FINAL MICROSCOPIC DIAGNOSIS:  A. PORTION OF ASCENDING AORTA: -  Elastic artery wall with hemorrhage consistent with clinical impression of dissection   GROSS DESCRIPTION:  Received fresh are several pieces of soft to rubbery tissue consistent with clinically stated aorta, ascending.   The pieces are 6.6 x 5.2 x 1.7 cm in aggregate, and individual pieces are up to 1.2 cm thick, with dark red soft material within the layers of the wall.  External surfaces vary from tan-pink to dark red, smooth, and inner surfaces are yellow, smooth.  Representative sections in one block.  SSW 04/04/2019    Final Diagnosis performed by Thressa Sheller, MD.   Electronically signed 04/05/2019 Technical and / or Professional components performed at [facility]. [facility], [place] [address], [place], [place] [address].  Immunohisto chemistry Technical component (if applicable) was performed at [facility]. [address] [place], [place], [place] [address].   IMMUNOHISTOCHEMISTRY DISCLAIMER (if applicable): Some of these immunohistochemical stains may have been developed and the performance characteristics determine by [facility]. Some may not have been cleared or approved by the U.S. Food and Drug Administration. The FDA has determined that such clearance  or approval is not necessary. This test is used for clinical purposes. It should not be regarded as investigational or for research. This laboratory is certified under the [place] (CLIA-88) as qualified to perform high complexity clinical laboratory testing.  The controls stained appropriately.   I-STAT 7, (LYTES, BLD GAS, ICA, H+H)     Status: Abnormal   Collection Time: 04/04/19 11:12 AM  Result Value Ref Range   pH, Arterial 7.252 (L) 7.350 - 7.450   pCO2 arterial 58.2 (H) 32.0 - 48.0 mmHg   pO2, Arterial 443.0 (H) 83.0 - 108.0 mmHg   Bicarbonate 25.6 20.0 - 28.0 mmol/L   TCO2 27 22 - 32 mmol/L   O2 Saturation 100.0 %   Acid-base deficit 2.0 0.0 - 2.0 mmol/L   Sodium 138 135 - 145 mmol/L   Potassium 4.2 3.5 - 5.1 mmol/L   Calcium, Ion 1.07 (L) 1.15 - 1.40 mmol/L   HCT 26.0 (L) 36.0 - 46.0 %   Hemoglobin 8.8 (L) 12.0 - 15.0 g/dL   Patient temperature  HIDE    Sample type CARDIOPULMONARY BYPASS   I-STAT, chem 8     Status: Abnormal   Collection Time: 04/04/19 11:17 AM  Result Value Ref Range   Sodium 138 135 - 145 mmol/L   Potassium 4.3 3.5 - 5.1 mmol/L   Chloride 101 98 - 111 mmol/L   BUN 30 (H) 8 - 23 mg/dL   Creatinine, Ser 2.70 (H) 0.44 - 1.00 mg/dL   Glucose, Bld 140 (H) 70 - 99 mg/dL   Calcium, Ion 1.06 (L) 1.15 - 1.40 mmol/L   TCO2 25 22 - 32 mmol/L   Hemoglobin 9.5 (L) 12.0 - 15.0 g/dL   HCT 28.0 (L) 36.0 - 46.0 %  Hemoglobin and hematocrit, blood     Status: Abnormal   Collection Time: 04/04/19 11:21 AM  Result Value Ref Range   Hemoglobin 8.7 (L) 12.0 - 15.0 g/dL   HCT 26.8 (L) 36.0 - 46.0 %    Comment: Performed at [facility], [place] [address], [place], [place] [address]  Platelet count     Status: Abnormal   Collection Time: 04/04/19 11:21 AM  Result Value Ref Range   Platelets 126 (L) 150 - 400 K/uL    Comment: REPEATED TO VERIFY Performed at [facility], [place] [address] [place], [place] [address]   Fibrinogen     Status: None   Collection Time: 04/04/19 11:21 AM  Result Value Ref Range   Fibrinogen 222 210 - 475 mg/dL    Comment: Performed at [facility] [address] [place], [place] [address]  I-STAT, [place] 8     Status: Abnormal   Collection Time: 04/04/19 12:22 PM  Result Value Ref Range   Sodium 141 135 - 145 mmol/L   Potassium 3.1 (L) 3.5 - 5.1 mmol/L   Chloride 104 98 - 111 mmol/L   BUN 28 (H) 8 - 23 mg/dL   Creatinine, Ser 2.80 (H) 0.44 - 1.00 mg/dL   Glucose, Bld 117 (H) 70 - 99 mg/dL   Calcium, Ion 0.98 (L) 1.15 - 1.40 mmol/L   TCO2 23 22 - 32 mmol/L   Hemoglobin 8.8 (L) 12.0 - 15.0 g/dL   HCT 26.0 (L) 36.0 - 46.0 %  I-STAT 7, (LYTES, BLD GAS, ICA, H+H)     Status: Abnormal   Collection Time: 04/04/19 12:26 PM  Result Value Ref Range   pH,  Arterial 7.405 7.350 - 7.450   pCO2 arterial 33.7 32.0 - 48.0 mmHg   pO2, Arterial 388.0 (H) 83.0 - 108.0 mmHg   Bicarbonate 21.1  20.0 - 28.0 mmol/L   TCO2 22 22 - 32 mmol/L   O2 Saturation 100.0 %   Acid-base deficit 3.0 (H) 0.0 - 2.0 mmol/L   Sodium 140 135 - 145 mmol/L   Potassium 3.1 (L) 3.5 - 5.1 mmol/L   Calcium, Ion 0.97 (L) 1.15 - 1.40 mmol/L   HCT 26.0 (L) 36.0 - 46.0 %   Hemoglobin 8.8 (L) 12.0 - 15.0 g/dL   Patient temperature HIDE    Sample type CARDIOPULMONARY BYPASS   Prepare Pheresed Platelets     Status: None   Collection Time: 04/04/19 12:30 PM  Result Value Ref Range   Unit Number [id number]    Blood Component Type PLTP LR2 PAS    Unit division 00    Status of Unit ISSUED,FINAL    Transfusion Status      OK TO TRANSFUSE Performed at [facility], [place] [address] [place], [place] [address]    Unit Number [id number]    Blood Component Type PLTP LR1 PAS    Unit division 00    Status of Unit ISSUED,FINAL    Transfusion Status OK TO TRANSFUSE   Prepare fresh frozen plasma     Status: None   Collection Time: 04/04/19 12:30 PM  Result Value Ref Range   Unit Number [id number]    Blood Component Type THAWED PLASMA    Unit division 00    Status of Unit ISSUED,FINAL    Transfusion Status      OK TO TRANSFUSE Performed at [facility], [place] [address] [place], [place] [address]    Unit Number [id number]    Blood Component Type THAWED PLASMA    Unit division 00    Status of Unit ISSUED,FINAL    Transfusion Status OK TO TRANSFUSE   Glucose, capillary     Status: Abnormal   Collection Time: 04/04/19  2:34 PM  Result Value Ref Range   Glucose-Capillary 114 (H) 70 - 99 mg/dL  I-STAT 7, (LYTES, BLD GAS, ICA, H+H)     Status: Abnormal   Collection Time: 04/04/19  2:41 PM  Result Value Ref Range   pH, Arterial 7.351 7.350 - 7.450   pCO2 arterial 35.8 32.0 - 48.0 mmHg   pO2, Arterial 91.0 83.0 - 108.0 mmHg   Bicarbonate 20.3 20.0 - 28.0 mmol/L   TCO2 21 (L) 22 - 32 mmol/L   O2 Saturation 97.0 %   Acid-base deficit 5.0 (H) 0.0 - 2.0 mmol/L   Sodium 142 135 - 145  mmol/L   Potassium 2.6 (LL) 3.5 - 5.1 mmol/L   Calcium, Ion 1.04 (L) 1.15 - 1.40 mmol/L   HCT 21.0 (L) 36.0 - 46.0 %   Hemoglobin 7.1 (L) 12.0 - 15.0 g/dL   Patient temperature 34.9 C    Sample type ARTERIAL   CBC     Status: Abnormal   Collection Time: 04/04/19  3:07 PM  Result Value Ref Range   WBC 8.5 4.0 - 10.5 K/uL   RBC 2.51 (L) 3.87 - 5.11 MIL/uL   Hemoglobin 7.4 (L) 12.0 - 15.0 g/dL   HCT 23.0 (L) 36.0 - 46.0 %   MCV 91.6 80.0 - 100.0 fL   MCH 29.5 26.0 - 34.0 pg   MCHC 32.2 30.0 - 36.0 g/dL   RDW 16.7 (H) 11.5 -  15.5 %   Platelets 175 150 - 400 K/uL   nRBC 0.0 0.0 - 0.2 %    Comment: Performed at [facility], [place] [address] [place], [place] [address]  Protime-INR     Status: Abnormal   Collection Time: 04/04/19  3:07 PM  Result Value Ref Range   Prothrombin Time 16.1 (H) 11.4 - 15.2 seconds   INR 1.3 (H) 0.8 - 1.2    Comment: (NOTE) INR goal varies based on device and disease states. Performed at [facility], [place] [address], [place], [place] [address]   APTT     Status: Abnormal   Collection Time: 04/04/19  3:07 PM  Result Value Ref Range   aPTT 40 (H) 24 - 36 seconds    Comment:        IF BASELINE aPTT IS ELEVATED, SUGGEST PATIENT RISK ASSESSMENT BE USED TO DETERMINE APPROPRIATE ANTICOAGULANT THERAPY. Performed at [facility], [place] [address] [place], [place] [address]   Prepare RBC     Status: None   Collection Time: 04/04/19  3:07 PM  Result Value Ref Range   Order Confirmation      ORDER PROCESSED BY BLOOD BANK BB SAMPLE OR UNITS ALREADY AVAILABLE Performed at [facility], [place] [address], [place], [place] [address]   Glucose, capillary     Status: Abnormal   Collection Time: 04/04/19  3:31 PM  Result Value Ref Range   Glucose-Capillary 108 (H) 70 - 99 mg/dL  Glucose, capillary     Status: None   Collection Time: 04/04/19  4:06 PM  Result Value Ref Range   Glucose-Capillary 91 70 - 99 mg/dL  Glucose, capillary      Status: Abnormal   Collection Time: 04/04/19  4:52 PM  Result Value Ref Range   Glucose-Capillary 116 (H) 70 - 99 mg/dL  Glucose, capillary     Status: Abnormal   Collection Time: 04/04/19  6:00 PM  Result Value Ref Range   Glucose-Capillary 116 (H) 70 - 99 mg/dL  Glucose, capillary     Status: Abnormal   Collection Time: 04/04/19  6:53 PM  Result Value Ref Range   Glucose-Capillary 125 (H) 70 - 99 mg/dL  CBC     Status: Abnormal   Collection Time: 04/04/19  7:40 PM  Result Value Ref Range   WBC 9.5 4.0 - 10.5 K/uL   RBC 3.52 (L) 3.87 - 5.11 MIL/uL   Hemoglobin 10.3 (L) 12.0 - 15.0 g/dL    Comment: REPEATED TO VERIFY POST TRANSFUSION SPECIMEN    HCT 31.6 (L) 36.0 - 46.0 %   MCV 89.8 80.0 - 100.0 fL   MCH 29.3 26.0 - 34.0 pg   MCHC 32.6 30.0 - 36.0 g/dL   RDW 15.9 (H) 11.5 - 15.5 %   Platelets 180 150 - 400 K/uL   nRBC 0.0 0.0 - 0.2 %    Comment: Performed at [facility], [place] [address] [place], [place] [address]  Basic metabolic panel     Status: Abnormal   Collection Time: 04/04/19  7:40 PM  Result Value Ref Range   Sodium 138 135 - 145 mmol/L   Potassium 4.7 3.5 - 5.1 mmol/L    Comment: DELTA CHECK NOTED NO VISIBLE HEMOLYSIS    Chloride 112 (H) 98 - 111 mmol/L   CO2 19 (L) 22 - 32 mmol/L   Glucose, Bld 158 (H) 70 - 99 mg/dL   BUN 25 (H)  8 - 23 mg/dL   Creatinine, Ser 2.78 (H) 0.44 - 1.00 mg/dL   Calcium 7.3 (L) 8.9 - 10.3 mg/dL   GFR calc non Af Amer 17 (L) >60 mL/min   GFR calc Af Amer 20 (L) >60 mL/min   Anion gap 7 5 - 15    Comment: Performed at [facility] [address] [place], [place] [address]  Magnesium     Status: None   Collection Time: 04/04/19  7:40 PM  Result Value Ref Range   Magnesium 2.1 1.7 - 2.4 mg/dL    Comment: Performed at [facility], [place] [address] [place] [address], [place], [place] [address]  Glucose, capillary     Status: Abnormal   Collection Time: 04/04/19  8:06 PM  Result Value Ref Range   Glucose-Capillary 146 (H) 70 - 99  mg/dL  Glucose, capillary     Status: Abnormal   Collection Time: 04/04/19  9:08 PM  Result Value Ref Range   Glucose-Capillary 110 (H) 70 - 99 mg/dL  Glucose, capillary     Status: Abnormal   Collection Time: 04/04/19 10:11 PM  Result Value Ref Range   Glucose-Capillary 118 (H) 70 - 99 mg/dL  Glucose, capillary     Status: Abnormal   Collection Time: 04/04/19 11:06 PM  Result Value Ref Range   Glucose-Capillary 101 (H) 70 - 99 mg/dL  Glucose, capillary     Status: None   Collection Time: 04/05/19  1:03 AM  Result Value Ref Range   Glucose-Capillary 95 70 - 99 mg/dL  Glucose, capillary     Status: Abnormal   Collection Time: 04/05/19  2:10 AM  Result Value Ref Range   Glucose-Capillary 104 (H) 70 - 99 mg/dL  Glucose, capillary     Status: None   Collection Time: 04/05/19  3:28 AM  Result Value Ref Range   Glucose-Capillary 90 70 - 99 mg/dL  I-STAT 7, (LYTES, BLD GAS, ICA, H+H)     Status: Abnormal   Collection Time: 04/05/19  3:28 AM  Result Value Ref Range   pH, Arterial 7.280 (L) 7.350 - 7.450   pCO2 arterial 37.2 32.0 - 48.0 mmHg   pO2, Arterial 78.0 (L) 83.0 - 108.0 mmHg   Bicarbonate 17.5 (L) 20.0 - 28.0 mmol/L   TCO2 19 (L) 22 - 32 mmol/L   O2 Saturation 94.0 %   Acid-base deficit 9.0 (H) 0.0 - 2.0 mmol/L   Sodium 143 135 - 145 mmol/L   Potassium 4.2 3.5 - 5.1 mmol/L   Calcium, Ion 1.10 (L) 1.15 - 1.40 mmol/L   HCT 24.0 (L) 36.0 - 46.0 %   Hemoglobin 8.2 (L) 12.0 - 15.0 g/dL   Patient temperature 36.8 C    Sample type ARTERIAL   CBC     Status: Abnormal   Collection Time: 04/05/19  4:42 AM  Result Value Ref Range   WBC 10.2 4.0 - 10.5 K/uL   RBC 2.88 (L) 3.87 - 5.11 MIL/uL   Hemoglobin 8.5 (L) 12.0 - 15.0 g/dL    Comment: REPEATED TO VERIFY   HCT 25.9 (L) 36.0 - 46.0 %   MCV 89.9 80.0 - 100.0 fL   MCH 29.5 26.0 - 34.0 pg   MCHC 32.8 30.0 - 36.0 g/dL   RDW 16.7 (H) 11.5 - 15.5 %   Platelets 129 (L) 150 - 400 K/uL    Comment: REPEATED TO VERIFY SPECIMEN  CHECKED FOR CLOTS    nRBC 0.0 0.0 - 0.2 %  Comment: Performed at [facility], [place] [address] [place], [place] [address]  Basic metabolic panel     Status: Abnormal   Collection Time: 04/05/19  4:42 AM  Result Value Ref Range   Sodium 140 135 - 145 mmol/L   Potassium 4.4 3.5 - 5.1 mmol/L   Chloride 112 (H) 98 - 111 mmol/L   CO2 18 (L) 22 - 32 mmol/L   Glucose, Bld 114 (H) 70 - 99 mg/dL   BUN 30 (H) 8 - 23 mg/dL   Creatinine, Ser 3.16 (H) 0.44 - 1.00 mg/dL   Calcium 7.6 (L) 8.9 - 10.3 mg/dL   GFR calc non Af Amer 15 (L) >60 mL/min   GFR calc Af Amer 17 (L) >60 mL/min   Anion gap 10 5 - 15    Comment: Performed at [facility] [address], [place], [place] [address]  Magnesium     Status: None   Collection Time: 04/05/19  4:42 AM  Result Value Ref Range   Magnesium 2.0 1.7 - 2.4 mg/dL    Comment: Performed at [facility] [address] [place], [place] [address]  Glucose, capillary     Status: Abnormal   Collection Time: 04/05/19  5:00 AM  Result Value Ref Range   Glucose-Capillary 120 (H) 70 - 99 mg/dL  Glucose, capillary     Status: Abnormal   Collection Time: 04/05/19  6:22 AM  Result Value Ref Range   Glucose-Capillary 126 (H) 70 - 99 mg/dL   Comment 1 QC Due   Glucose, capillary     Status: Abnormal   Collection Time: 04/05/19  7:29 AM  Result Value Ref Range   Glucose-Capillary 116 (H) 70 - 99 mg/dL  Glucose, capillary     Status: None   Collection Time: 04/05/19  8:36 AM  Result Value Ref Range   Glucose-Capillary 70 70 - 99 mg/dL  Glucose, capillary     Status: Abnormal   Collection Time: 04/05/19  8:52 AM  Result Value Ref Range   Glucose-Capillary 119 (H) 70 - 99 mg/dL  I-STAT 7, (LYTES, BLD GAS, ICA, H+H)     Status: Abnormal   Collection Time: 04/05/19  9:44 AM  Result Value Ref Range   pH, Arterial 7.313 (L) 7.350 - 7.450   pCO2 arterial 32.1 32.0 - 48.0 mmHg   pO2, Arterial 124.0 (H) 83.0 - 108.0 mmHg   Bicarbonate 16.3 (L)  20.0 - 28.0 mmol/L   TCO2 17 (L) 22 - 32 mmol/L   O2 Saturation 99.0 %   Acid-base deficit 9.0 (H) 0.0 - 2.0 mmol/L   Sodium 142 135 - 145 mmol/L   Potassium 3.6 3.5 - 5.1 mmol/L   Calcium, Ion 1.08 (L) 1.15 - 1.40 mmol/L   HCT 22.0 (L) 36.0 - 46.0 %   Hemoglobin 7.5 (L) 12.0 - 15.0 g/dL   Patient temperature 36.8 C    Sample type ARTERIAL   Glucose, capillary     Status: Abnormal   Collection Time: 04/05/19  9:46 AM  Result Value Ref Range   Glucose-Capillary 153 (H) 70 - 99 mg/dL  Glucose, capillary     Status: Abnormal   Collection Time: 04/05/19 10:47 AM  Result Value Ref Range   Glucose-Capillary 123 (H) 70 - 99 mg/dL  I-STAT 7, (LYTES, BLD GAS, ICA, H+H)     Status: Abnormal   Collection Time: 04/05/19 10:47 AM  Result Value Ref Range   pH, Arterial  7.386 7.350 - 7.450   pCO2 arterial 27.0 (L) 32.0 - 48.0 mmHg   pO2, Arterial 104.0 83.0 - 108.0 mmHg   Bicarbonate 16.2 (L) 20.0 - 28.0 mmol/L   TCO2 17 (L) 22 - 32 mmol/L   O2 Saturation 98.0 %   Acid-base deficit 8.0 (H) 0.0 - 2.0 mmol/L   Sodium 146 (H) 135 - 145 mmol/L   Potassium 2.6 (LL) 3.5 - 5.1 mmol/L   Calcium, Ion 0.90 (L) 1.15 - 1.40 mmol/L   HCT 18.0 (L) 36.0 - 46.0 %   Hemoglobin 6.1 (LL) 12.0 - 15.0 g/dL   Patient temperature 37.0 C    Sample type ARTERIAL   CBC     Status: Abnormal   Collection Time: 04/05/19 10:53 AM  Result Value Ref Range   WBC 11.7 (H) 4.0 - 10.5 K/uL   RBC 2.82 (L) 3.87 - 5.11 MIL/uL   Hemoglobin 8.3 (L) 12.0 - 15.0 g/dL    Comment: REPEATED TO VERIFY   HCT 24.6 (L) 36.0 - 46.0 %   MCV 87.2 80.0 - 100.0 fL   MCH 29.4 26.0 - 34.0 pg   MCHC 33.7 30.0 - 36.0 g/dL   RDW 16.7 (H) 11.5 - 15.5 %   Platelets 116 (L) 150 - 400 K/uL    Comment: REPEATED TO VERIFY PLATELET COUNT CONFIRMED BY SMEAR Immature Platelet Fraction may be clinically indicated, consider ordering this additional test [id number]    nRBC 0.0 0.0 - 0.2 %    Comment: Performed at [facility], [place]  [address], [place], [place] [address]  Basic metabolic panel     Status: Abnormal   Collection Time: 04/05/19 10:53 AM  Result Value Ref Range   Sodium 142 135 - 145 mmol/L   Potassium 3.8 3.5 - 5.1 mmol/L   Chloride 108 98 - 111 mmol/L   CO2 23 22 - 32 mmol/L   Glucose, Bld 174 (H) 70 - 99 mg/dL   BUN 29 (H) 8 - 23 mg/dL   Creatinine, Ser 3.11 (H) 0.44 - 1.00 mg/dL   Calcium 7.8 (L) 8.9 - 10.3 mg/dL   GFR calc non Af Amer 15 (L) >60 mL/min   GFR calc Af Amer 17 (L) >60 mL/min   Anion gap 11 5 - 15    Comment: Performed at [facility] [name] [place], [place] [address]  Glucose, capillary     Status: Abnormal   Collection Time: 04/05/19 11:55 AM  Result Value Ref Range   Glucose-Capillary 159 (H) 70 - 99 mg/dL  I-STAT 7, (LYTES, BLD GAS, ICA, H+H)     Status: Abnormal   Collection Time: 04/05/19 12:15 PM  Result Value Ref Range   pH, Arterial 7.347 (L) 7.350 - 7.450   pCO2 arterial 38.6 32.0 - 48.0 mmHg   pO2, Arterial 76.0 (L) 83.0 - 108.0 mmHg   Bicarbonate 21.3 20.0 - 28.0 mmol/L   TCO2 22 22 - 32 mmol/L   O2 Saturation 95.0 %   Acid-base deficit 4.0 (H) 0.0 - 2.0 mmol/L   Sodium 143 135 - 145 mmol/L   Potassium 3.4 (L) 3.5 - 5.1 mmol/L   Calcium, Ion 1.06 (L) 1.15 - 1.40 mmol/L   HCT 23.0 (L) 36.0 - 46.0 %   Hemoglobin 7.8 (L) 12.0 - 15.0 g/dL   Patient temperature 36.5 C    Sample type ARTERIAL   I-STAT 7, (LYTES, BLD GAS, ICA, H+H)     Status: Abnormal  Collection Time: 04/05/19  1:13 PM  Result Value Ref Range   pH, Arterial 7.216 (L) 7.350 - 7.450   pCO2 arterial 46.4 32.0 - 48.0 mmHg   pO2, Arterial 59.0 (L) 83.0 - 108.0 mmHg   Bicarbonate 19.0 (L) 20.0 - 28.0 mmol/L   TCO2 20 (L) 22 - 32 mmol/L   O2 Saturation 86.0 %   Acid-base deficit 8.0 (H) 0.0 - 2.0 mmol/L   Sodium 143 135 - 145 mmol/L   Potassium 3.9 3.5 - 5.1 mmol/L   Calcium, Ion 1.08 (L) 1.15 - 1.40 mmol/L   HCT 26.0 (L) 36.0 - 46.0 %   Hemoglobin 8.8 (L) 12.0 - 15.0 g/dL   Patient  temperature 36.2 C    Sample type CARDIOPULMONARY BYPASS   Glucose, capillary     Status: Abnormal   Collection Time: 04/05/19  1:23 PM  Result Value Ref Range   Glucose-Capillary 155 (H) 70 - 99 mg/dL  Glucose, capillary     Status: Abnormal   Collection Time: 04/05/19  2:16 PM  Result Value Ref Range   Glucose-Capillary 123 (H) 70 - 99 mg/dL  I-STAT 7, (LYTES, BLD GAS, ICA, H+H)     Status: Abnormal   Collection Time: 04/05/19  2:19 PM  Result Value Ref Range   pH, Arterial 7.273 (L) 7.350 - 7.450   pCO2 arterial 44.7 32.0 - 48.0 mmHg   pO2, Arterial 158.0 (H) 83.0 - 108.0 mmHg   Bicarbonate 20.7 20.0 - 28.0 mmol/L   TCO2 22 22 - 32 mmol/L   O2 Saturation 99.0 %   Acid-base deficit 6.0 (H) 0.0 - 2.0 mmol/L   Sodium 144 135 - 145 mmol/L   Potassium 3.5 3.5 - 5.1 mmol/L   Calcium, Ion 1.06 (L) 1.15 - 1.40 mmol/L   HCT 22.0 (L) 36.0 - 46.0 %   Hemoglobin 7.5 (L) 12.0 - 15.0 g/dL   Patient temperature 37.0 C    Sample type ARTERIAL   Glucose, capillary     Status: None   Collection Time: 04/05/19  3:01 PM  Result Value Ref Range   Glucose-Capillary 87 70 - 99 mg/dL  Prepare RBC     Status: None   Collection Time: 04/05/19  3:31 PM  Result Value Ref Range   Order Confirmation      ORDER PROCESSED BY BLOOD BANK Performed at [facility], [address] [place], [place] [address]   Glucose, capillary     Status: None   Collection Time: 04/05/19  4:10 PM  Result Value Ref Range   Glucose-Capillary 89 70 - 99 mg/dL  Basic metabolic panel     Status: Abnormal   Collection Time: 04/05/19  4:28 PM  Result Value Ref Range   Sodium 142 135 - 145 mmol/L   Potassium 4.3 3.5 - 5.1 mmol/L    Comment: SLIGHT HEMOLYSIS   Chloride 107 98 - 111 mmol/L   CO2 21 (L) 22 - 32 mmol/L   Glucose, Bld 91 70 - 99 mg/dL   BUN 33 (H) 8 - 23 mg/dL   Creatinine, Ser 3.54 (H) 0.44 - 1.00 mg/dL   Calcium 7.8 (L) 8.9 - 10.3 mg/dL   GFR calc non Af Amer 13 (L) >60 mL/min   GFR calc Af Amer  15 (L) >60 mL/min   Anion gap 14 5 - 15    Comment: Performed at [facility], [place] [address] [place], [place] [address]  Magnesium  Status: None   Collection Time: 04/05/19  4:28 PM  Result Value Ref Range   Magnesium 2.1 1.7 - 2.4 mg/dL    Comment: Performed at [facility], [place] [address], [place], [place] [address]  CBC     Status: Abnormal   Collection Time: 04/05/19  4:28 PM  Result Value Ref Range   WBC 12.4 (H) 4.0 - 10.5 K/uL   RBC 2.89 (L) 3.87 - 5.11 MIL/uL   Hemoglobin 8.6 (L) 12.0 - 15.0 g/dL   HCT 25.8 (L) 36.0 - 46.0 %   MCV 89.3 80.0 - 100.0 fL   MCH 29.8 26.0 - 34.0 pg   MCHC 33.3 30.0 - 36.0 g/dL   RDW 17.2 (H) 11.5 - 15.5 %   Platelets 116 (L) 150 - 400 K/uL    Comment: REPEATED TO VERIFY Immature Platelet Fraction may be clinically indicated, consider ordering this additional test [id number] CONSISTENT WITH PREVIOUS RESULT    nRBC 0.0 0.0 - 0.2 %    Comment: Performed at [facility], [place] [address] [place], [place] [address]  Hemoglobin and hematocrit, blood     Status: Abnormal   Collection Time: 04/05/19  7:30 PM  Result Value Ref Range   Hemoglobin 11.1 (L) 12.0 - 15.0 g/dL    Comment: REPEATED TO VERIFY POST TRANSFUSION SPECIMEN    HCT 32.4 (L) 36.0 - 46.0 %    Comment: Performed at [facility] [address], [place], [place] [address]  Basic metabolic panel     Status: Abnormal   Collection Time: 04/06/19  2:50 AM  Result Value Ref Range   Sodium 141 135 - 145 mmol/L   Potassium 4.7 3.5 - 5.1 mmol/L   Chloride 108 98 - 111 mmol/L   CO2 20 (L) 22 - 32 mmol/L   Glucose, Bld 125 (H) 70 - 99 mg/dL   BUN 42 (H) 8 - 23 mg/dL   Creatinine, Ser 4.15 (H) 0.44 - 1.00 mg/dL   Calcium 7.5 (L) 8.9 - 10.3 mg/dL   GFR calc non Af Amer 10 (L) >60 mL/min   GFR calc Af Amer 12 (L) >60 mL/min   Anion gap 13 5 - 15    Comment: Performed at [facility] [address] [place], [place] [address]  CBC     Status: Abnormal    Collection Time: 04/06/19  2:50 AM  Result Value Ref Range   WBC 12.5 (H) 4.0 - 10.5 K/uL   RBC 3.55 (L) 3.87 - 5.11 MIL/uL   Hemoglobin 10.7 (L) 12.0 - 15.0 g/dL   HCT 32.1 (L) 36.0 - 46.0 %   MCV 90.4 80.0 - 100.0 fL   MCH 30.1 26.0 - 34.0 pg   MCHC 33.3 30.0 - 36.0 g/dL   RDW 16.8 (H) 11.5 - 15.5 %   Platelets 91 (L) 150 - 400 K/uL    Comment: Immature Platelet Fraction may be clinically indicated, consider ordering this additional test [id number] CONSISTENT WITH PREVIOUS RESULT    nRBC 0.0 0.0 - 0.2 %    Comment: Performed at [facility], [place] [address] [place], [place] [address]  Magnesium     Status: None   Collection Time: 04/06/19  2:50 AM  Result Value Ref Range   Magnesium 2.2 1.7 - 2.4 mg/dL    Comment: Performed at [facility] [address] [place], [place] [address]  Phosphorus     Status: Abnormal  Collection Time: 04/06/19  2:50 AM  Result Value Ref Range   Phosphorus 9.3 (H) 2.5 - 4.6 mg/dL    Comment: Performed at [address], [place], [place] [address]  I-STAT 7, (LYTES, BLD GAS, ICA, H+H)     Status: Abnormal   Collection Time: 04/06/19  6:18 AM  Result Value Ref Range   pH, Arterial 7.021 (LL) 7.350 - 7.450   pCO2 arterial 81.0 (HH) 32.0 - 48.0 mmHg   pO2, Arterial 80.0 (L) 83.0 - 108.0 mmHg   Bicarbonate 21.0 20.0 - 28.0 mmol/L   TCO2 23 22 - 32 mmol/L   O2 Saturation 87.0 %   Acid-base deficit 11.0 (H) 0.0 - 2.0 mmol/L   Sodium 142 135 - 145 mmol/L   Potassium 6.0 (H) 3.5 - 5.1 mmol/L   Calcium, Ion 1.06 (L) 1.15 - 1.40 mmol/L   HCT 33.0 (L) 36.0 - 46.0 %   Hemoglobin 11.2 (L) 12.0 - 15.0 g/dL   Patient temperature 37.0 C    Sample type ARTERIAL    Comment NOTIFIED PHYSICIAN   .Cooxemetry Panel (carboxy, met, total hgb, O2 sat)     Status: None   Collection Time: 04/06/19  7:42 AM  Result Value Ref Range   Total hemoglobin 14.5 12.0 - 16.0 g/dL   O2 Saturation 74.4 %   Carboxyhemoglobin 0.6 0.5 - 1.5  %   Methemoglobin 0.8 0.0 - 1.5 %    Comment: Performed at [facility], [place] [address] [place], [place] [address]  Glucose, capillary     Status: Abnormal   Collection Time: 04/06/19  8:29 AM  Result Value Ref Range   Glucose-Capillary 116 (H) 70 - 99 mg/dL     ROS:  Review of systems not obtained due to patient factors.  Physical Exam: Vitals:   04/06/19 0800 04/06/19 0835  BP: 118/74   Pulse: 88   Resp: 18   Temp: (!) 97.5 F (36.4 C) (!) 96.2 F (35.7 C)  SpO2: 100%      General exam: laying in bed, on bipap. Responded to name. euvolemic on exam.  Respiratory system: no crackles or wheezing heard on exam, although difficult to auscultate over sound of bipap.  Cardiovascular system: regular rhythm, normal rate. No murmurs. No pedal edema.  Gastrointestinal system: Abdomen is nondistended, soft and nontender. Normal bowel sounds heard. Central nervous system: alert, unable to communicate verbally d/t bipap but appears to be oriented to person, place, and situation.  Extremities: patient in restraints. Unable to assess strength.  Skin: No rashes, lesions or ulcers Psychiatry: Judgement and insight appear normal. Not agitated.   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 CKD probably from long standing poorly controlled HTN.  Pt started on IV lasix drip yesterday, with subsequent worsening of urinary output (<166m over the past 18 hours)  2. combined severe metabolic, respiratory acidosis - s/p intubation patient became agitated, although appears calm now.  She was found to have a pH of 7.021 this am, down from 7.27 yesterday afternoon along with a pCO2 of 80, which was normal prior to this. During this time her CO2 has also decrased from 24 to 20, indicating an underlying metabolic acidosis as well.  Pt has become oliguric during this time, with UOP of  < 1071min past ~18 hrs despite being on lasix drip  during that time. Cr increased from 3.36  To 4.15 and  K has increased to 6.0. Given the patient's unresponsiveness to diuresis and her severe acidosis, she will likely need urgent hemodialysis.  Will get additional ABG.   3. Thoracic aortic dissection - s/p surgical repair.  Being managed in ICU by cardiothoracic surgery team. Pt on dopamine, levophed, and precedex drip. Currently on bipap.   4.Hypertension/volume  - normotensive while on levophed, dopamine drips.   5. Anemia  - currently stable.  S/p 3 U pRBC, 2 U FFP  See attending attestation for final assessment and plan.   Benay Pike 04/06/2019, 8:47 AM  PGY2

## 2019-04-06 NOTE — Progress Notes (Signed)
2 Days Post-Op Procedure(s) (LRB): THORACIC ASCENDING ANEURYSM REPAIR (AAA)  USING 28 MM X 30 CM HEMASHIELD PLATINUM VASCULAR GRAFT (N/A) Transesophageal Echocardiogram (Tee) (N/A) Subjective: No complaints  Objective: Vital signs in last 24 hours: Temp:  [96.2 F (35.7 C)-98.6 F (37 C)] 98.2 F (36.8 C) (11/05 1148) Pulse Rate:  [27-111] 72 (11/05 1600) Cardiac Rhythm: A-V Sequential paced (11/05 1200) Resp:  [9-31] 9 (11/05 1600) BP: (63-167)/(40-113) 149/91 (11/05 1600) SpO2:  [74 %-100 %] 98 % (11/05 1600) Arterial Line BP: (60-190)/(45-135) 139/92 (11/05 1600) FiO2 (%):  [40 %-100 %] 100 % (11/05 1520) Weight:  [66.2 kg] 66.2 kg (11/05 0500)  Hemodynamic parameters for last 24 hours:    Intake/Output from previous day: 11/04 0701 - 11/05 0700 In: 2493.7 [P.O.:40; I.V.:1323.6; Blood:330; IV Piggyback:800.1] Out: 1515 [Urine:875; Chest Tube:640] Intake/Output this shift: Total I/O In: 390.3 [I.V.:390.3] Out: 113 [Urine:33; Chest Tube:80]  General appearance: delirious and mild distress Neurologic: intact Heart: regular rate and rhythm, S1, S2 normal, no murmur, click, rub or gallop Lungs: diminished breath sounds bilaterally Abdomen: soft, non-tender; bowel sounds normal; no masses,  no organomegaly Extremities: edema 2+ Wound: dressed, dry  Lab Results: Recent Labs    04/05/19 1628  04/06/19 0250 04/06/19 0618  WBC 12.4*  --  12.5*  --   HGB 8.6*   < > 10.7* 11.2*  HCT 25.8*   < > 32.1* 33.0*  PLT 116*  --  91*  --    < > = values in this interval not displayed.   BMET:  Recent Labs    04/06/19 0250 04/06/19 0618 04/06/19 1450  NA 141 142 141  K 4.7 6.0* 4.6  CL 108  --  103  CO2 20*  --  23  GLUCOSE 125*  --  166*  BUN 42*  --  53*  CREATININE 4.15*  --  5.31*  CALCIUM 7.5*  --  7.5*    PT/INR:  Recent Labs    04/04/19 1507  LABPROT 16.1*  INR 1.3*   ABG    Component Value Date/Time   PHART 7.021 (LL) 04/06/2019 0618   HCO3 21.0  04/06/2019 0618   TCO2 23 04/06/2019 0618   ACIDBASEDEF 11.0 (H) 04/06/2019 0618   O2SAT 74.4 04/06/2019 0742   CBG (last 3)  Recent Labs    04/06/19 0255 04/06/19 0829 04/06/19 1149  GLUCAP 77 116* 106*    Assessment/Plan: S/P Procedure(s) (LRB): THORACIC ASCENDING ANEURYSM REPAIR (AAA)  USING 28 MM X 30 CM HEMASHIELD PLATINUM VASCULAR GRAFT (N/A) Transesophageal Echocardiogram (Tee) (N/A) urgent HD as she has been unresponsive to IV diuretic   LOS: 3 days    Wonda Olds 04/06/2019

## 2019-04-07 ENCOUNTER — Inpatient Hospital Stay (HOSPITAL_COMMUNITY): Payer: Medicare Other

## 2019-04-07 DIAGNOSIS — Z8679 Personal history of other diseases of the circulatory system: Secondary | ICD-10-CM

## 2019-04-07 HISTORY — DX: Personal history of other diseases of the circulatory system: Z86.79

## 2019-04-07 LAB — RENAL FUNCTION PANEL
Albumin: 2.9 g/dL — ABNORMAL LOW (ref 3.5–5.0)
Anion gap: 13 (ref 5–15)
BUN: 44 mg/dL — ABNORMAL HIGH (ref 8–23)
CO2: 24 mmol/L (ref 22–32)
Calcium: 7.4 mg/dL — ABNORMAL LOW (ref 8.9–10.3)
Chloride: 101 mmol/L (ref 98–111)
Creatinine, Ser: 4.46 mg/dL — ABNORMAL HIGH (ref 0.44–1.00)
GFR calc Af Amer: 11 mL/min — ABNORMAL LOW (ref >60)
GFR calc non Af Amer: 10 mL/min — ABNORMAL LOW (ref >60)
Glucose, Bld: 235 mg/dL — ABNORMAL HIGH (ref 70–99)
Phosphorus: 5.9 mg/dL — ABNORMAL HIGH (ref 2.5–4.6)
Potassium: 3.7 mmol/L (ref 3.5–5.1)
Sodium: 138 mmol/L (ref 135–145)

## 2019-04-07 LAB — POCT I-STAT 7, (LYTES, BLD GAS, ICA,H+H)
Bicarbonate: 24.5 mmol/L (ref 20.0–28.0)
Calcium, Ion: 1.04 mmol/L — ABNORMAL LOW (ref 1.15–1.40)
HCT: 30 % — ABNORMAL LOW (ref 36.0–46.0)
Hemoglobin: 10.2 g/dL — ABNORMAL LOW (ref 12.0–15.0)
O2 Saturation: 96 %
Potassium: 3.8 mmol/L (ref 3.5–5.1)
Sodium: 140 mmol/L (ref 135–145)
TCO2: 26 mmol/L (ref 22–32)
pCO2 arterial: 38.9 mmHg (ref 32.0–48.0)
pH, Arterial: 7.407 (ref 7.350–7.450)
pO2, Arterial: 84 mmHg (ref 83.0–108.0)

## 2019-04-07 LAB — GLUCOSE, CAPILLARY
Glucose-Capillary: 122 mg/dL — ABNORMAL HIGH (ref 70–99)
Glucose-Capillary: 124 mg/dL — ABNORMAL HIGH (ref 70–99)
Glucose-Capillary: 136 mg/dL — ABNORMAL HIGH (ref 70–99)
Glucose-Capillary: 136 mg/dL — ABNORMAL HIGH (ref 70–99)
Glucose-Capillary: 137 mg/dL — ABNORMAL HIGH (ref 70–99)
Glucose-Capillary: 148 mg/dL — ABNORMAL HIGH (ref 70–99)
Glucose-Capillary: 153 mg/dL — ABNORMAL HIGH (ref 70–99)

## 2019-04-07 LAB — URINE CULTURE: Culture: NO GROWTH

## 2019-04-07 MED ORDER — ALBUMIN HUMAN 25 % IV SOLN
INTRAVENOUS | Status: AC
  Filled 2019-04-07: qty 100

## 2019-04-07 MED ORDER — HALOPERIDOL LACTATE 5 MG/ML IJ SOLN
0.5000 mg | Freq: Four times a day (QID) | INTRAMUSCULAR | Status: DC
  Administered 2019-04-07 – 2019-04-09 (×7): 0.5 mg via INTRAVENOUS
  Filled 2019-04-07 (×7): qty 1

## 2019-04-07 MED ORDER — THIAMINE HCL 100 MG/ML IJ SOLN
Freq: Once | INTRAVENOUS | Status: AC
  Administered 2019-04-07: 20:00:00 via INTRAVENOUS
  Filled 2019-04-07: qty 1000

## 2019-04-07 MED ORDER — CHLORHEXIDINE GLUCONATE 0.12 % MT SOLN
15.0000 mL | Freq: Two times a day (BID) | OROMUCOSAL | Status: DC
  Administered 2019-04-07 – 2019-04-09 (×4): 15 mL via OROMUCOSAL
  Filled 2019-04-07: qty 15

## 2019-04-07 MED ORDER — ORAL CARE MOUTH RINSE
15.0000 mL | Freq: Two times a day (BID) | OROMUCOSAL | Status: DC
  Administered 2019-04-08: 14:00:00 15 mL via OROMUCOSAL

## 2019-04-07 MED ORDER — CHLORHEXIDINE GLUCONATE CLOTH 2 % EX PADS
6.0000 | MEDICATED_PAD | Freq: Every day | CUTANEOUS | Status: DC
  Administered 2019-04-07: 6 via TOPICAL

## 2019-04-07 MED ORDER — HEPARIN SODIUM (PORCINE) 1000 UNIT/ML IJ SOLN
INTRAMUSCULAR | Status: AC
  Filled 2019-04-07: qty 4

## 2019-04-07 MED FILL — Heparin Sodium (Porcine) Inj 1000 Unit/ML: INTRAMUSCULAR | Qty: 10 | Status: AC

## 2019-04-07 MED FILL — Mannitol IV Soln 20%: INTRAVENOUS | Qty: 500 | Status: AC

## 2019-04-07 MED FILL — Magnesium Sulfate Inj 50%: INTRAMUSCULAR | Qty: 10 | Status: AC

## 2019-04-07 MED FILL — Heparin Sodium (Porcine) Inj 1000 Unit/ML: INTRAMUSCULAR | Qty: 30 | Status: AC

## 2019-04-07 MED FILL — Electrolyte-R (PH 7.4) Solution: INTRAVENOUS | Qty: 4000 | Status: AC

## 2019-04-07 MED FILL — Sodium Bicarbonate IV Soln 8.4%: INTRAVENOUS | Qty: 50 | Status: AC

## 2019-04-07 MED FILL — Heparin Sodium (Porcine) Inj 1000 Unit/ML: INTRAMUSCULAR | Qty: 2500 | Status: AC

## 2019-04-07 MED FILL — Potassium Chloride Inj 2 mEq/ML: INTRAVENOUS | Qty: 40 | Status: AC

## 2019-04-07 MED FILL — Sodium Chloride IV Soln 0.9%: INTRAVENOUS | Qty: 2000 | Status: AC

## 2019-04-07 NOTE — Progress Notes (Signed)
SLP Cancellation Note  Patient Details Name: Monique Henderson MRN: [id number] DOB: [birth year]   Cancelled treatment:        Attempted to see pt for ongoing dysphagia treatment.  Pt not appropriate for POs at time of attempt.  On BiPAP.   Celedonio Savage, [place], [place] Office: [phone] 04/07/2019, 10:34 AM

## 2019-04-07 NOTE — Progress Notes (Signed)
3 Days Post-Op Procedure(s) (LRB): THORACIC ASCENDING ANEURYSM REPAIR (AAA)  USING 28 MM X 30 CM HEMASHIELD PLATINUM VASCULAR GRAFT (N/A) Transesophageal Echocardiogram (Tee) (N/A) Subjective: No complaints  Objective: Vital signs in last 24 hours: Temp:  [97.1 F (36.2 C)-99 F (37.2 C)] 97.6 F (36.4 C) (11/06 0729) Pulse Rate:  [31-100] 78 (11/06 0729) Cardiac Rhythm: Atrial fibrillation (11/05 2130) Resp:  [8-33] 29 (11/06 0729) BP: (101-205)/(63-121) 193/108 (11/06 0700) SpO2:  [91 %-100 %] 99 % (11/06 0729) Arterial Line BP: (97-209)/(70-203) 209/203 (11/06 0700) FiO2 (%):  [40 %-100 %] 70 % (11/06 0729) Weight:  [67.7 kg-68.8 kg] 67.7 kg (11/05 2124)  Hemodynamic parameters for last 24 hours: CVP:  [2 mmHg-8 mmHg] 8 mmHg  Intake/Output from previous day: 11/05 0701 - 11/06 0700 In: 928.6 [I.V.:808.8; IV Piggyback:99.9] Out: 1443 [Urine:93; Chest Tube:350] Intake/Output this shift: No intake/output data recorded.  General appearance: mild distress Neurologic: intact Heart: regular rate and rhythm, S1, S2 normal, no murmur, click, rub or gallop Lungs: rales bilaterally Abdomen: soft, non-tender; bowel sounds normal; no masses,  no organomegaly Extremities: edema 2+ Wound: dressed, dry  Lab Results: Recent Labs    04/05/19 1628  04/06/19 0250  04/06/19 1748 04/07/19 0833  WBC 12.4*  --  12.5*  --   --   --   HGB 8.6*   < > 10.7*   < > 7.8* 10.2*  HCT 25.8*   < > 32.1*   < > 23.0* 30.0*  PLT 116*  --  91*  --   --   --    < > = values in this interval not displayed.   BMET:  Recent Labs    04/06/19 1450  04/07/19 0554 04/07/19 0833  NA 141   < > 138 140  K 4.6   < > 3.7 3.8  CL 103  --  101  --   CO2 23  --  24  --   GLUCOSE 166*  --  235*  --   BUN 53*  --  44*  --   CREATININE 5.31*  --  4.46*  --   CALCIUM 7.5*  --  7.4*  --    < > = values in this interval not displayed.    PT/INR:  Recent Labs    04/04/19 1507  LABPROT 16.1*  INR 1.3*    ABG    Component Value Date/Time   PHART 7.407 04/07/2019 0833   HCO3 24.5 04/07/2019 0833   TCO2 26 04/07/2019 0833   ACIDBASEDEF 8.0 (H) 04/06/2019 1748   O2SAT 96.0 04/07/2019 0833   CBG (last 3)  Recent Labs    04/06/19 2026 04/07/19 0005 04/07/19 0348  GLUCAP 119* 136* 153*    Assessment/Plan: S/P Procedure(s) (LRB): THORACIC ASCENDING ANEURYSM REPAIR (AAA)  USING 28 MM X 30 CM HEMASHIELD PLATINUM VASCULAR GRAFT (N/A) Transesophageal Echocardiogram (Tee) (N/A) Diuresis check ABG; minimize sedation  Leave chest tubes this am--may remove later today   LOS: 4 days    Wonda Olds 04/07/2019

## 2019-04-07 NOTE — Progress Notes (Signed)
[facility] NEPHROLOGY PROGRESS NOTE  Assessment/ Plan: Pt is a 68 y.o. 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  #AKI on CKD stage IV, oliguric due to ischemic ATN in the setting of hypotension: Baseline creatinine level around 2.5-3.  Did not respond with diuretics and became fluid overload.  Started HD on 11/5 via temporary left subclavian catheter.  Remains anuric and fluid overload.  Plan for another dialysis today to optimize volume. -UA with UTI, currently on ceftriaxone.  Follow culture result. -Kidney ultrasound pending.  #Hypertension: Blood pressure is currently acceptable.  Continue to monitor.  #Acute respiratory failure probably due to volume overload: Currently on the BiPAP.  Ultrafiltration during dialysis.  She may need mechanical ventilation, defer to ICU team.  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  Per CT surgery.  #Anemia, acute blood loss: Monitor hemoglobin.  Transfuse as needed.  Discussed with Dr. Orvan Seen and ICU nurse.  Subjective: Seen and examined in ICU.  Tolerated hemodialysis yesterday.  Blood pressure acceptable today.  Still short of breath and currently on BiPAP.  Remains oliguric.    Objective Vital signs in last 24 hours: Vitals:   04/07/19 0630 04/07/19 0645 04/07/19 0700 04/07/19 0729  BP: (!) 170/101  (!) 193/108   Pulse: 77 77 79 78  Resp: (!) 32 (!) 33 (!) 27 (!) 29  Temp:    97.6 F (36.4 C)  TempSrc:    Oral  SpO2: 97% 97% 99% 99%  Weight:      Height:       Weight change: 2.6 kg  Intake/Output Summary (Last 24 hours) at 04/07/2019 0919 Last data filed at 04/07/2019 0655 Gross per 24 hour  Intake 739.09 ml  Output 1443 ml  Net -703.91 ml       Labs: Basic Metabolic Panel: Recent Labs  Lab 04/06/19 0250  04/06/19 1450   04/06/19 1748 04/07/19 0554 04/07/19 0833  NA 141   < > 141   < > 146* 138 140  K 4.7   < > 4.6   < > 3.2* 3.7 3.8  CL 108  --  103  --   --  101  --   CO2 20*  --  23  --   --  24  --   GLUCOSE 125*  --  166*  --   --  235*  --   BUN 42*  --  53*  --   --  44*  --   CREATININE 4.15*  --  5.31*  --   --  4.46*  --   CALCIUM 7.5*  --  7.5*  --   --  7.4*  --   PHOS 9.3*  --  9.9*  --   --  5.9*  --    < > = values in this interval not displayed.   Liver Function Tests: Recent Labs  Lab 04/03/19 1822 04/06/19 1450 04/07/19 0554  AST 24  --   --   ALT 14  --   --   ALKPHOS 56  --   --   BILITOT 0.8  --   --   PROT 5.9*  --   --   ALBUMIN 3.3* 3.2* 2.9*   Recent Labs  Lab 04/03/19 1822  LIPASE 30   No results for input(s): AMMONIA  in the last 168 hours. CBC: Recent Labs  Lab 04/03/19 1822  04/04/19 1940  04/05/19 0442  04/05/19 1053  04/05/19 1628  04/06/19 0250  04/06/19 1452 04/06/19 1748 04/07/19 0833  WBC 7.9   < > 9.5  --  10.2  --  11.7*  --  12.4*  --  12.5*  --   --   --   --   NEUTROABS 5.4  --   --   --   --   --   --   --   --   --   --   --   --   --   --   HGB 10.8*   < > 10.3*   < > 8.5*   < > 8.3*   < > 8.6*   < > 10.7*   < > 11.2* 7.8* 10.2*  HCT 34.2*   < > 31.6*   < > 25.9*   < > 24.6*   < > 25.8*   < > 32.1*   < > 33.0* 23.0* 30.0*  MCV 94.7   < > 89.8  --  89.9  --  87.2  --  89.3  --  90.4  --   --   --   --   PLT 305   < > 180  --  129*  --  116*  --  116*  --  91*  --   --   --   --    < > = values in this interval not displayed.   Cardiac Enzymes: No results for input(s): CKTOTAL, CKMB, CKMBINDEX, TROPONINI in the last 168 hours. CBG: Recent Labs  Lab 04/06/19 0829 04/06/19 1149 04/06/19 2026 04/07/19 0005 04/07/19 0348  GLUCAP 116* 106* 119* 136* 153*    Iron Studies: No results for input(s): IRON, TIBC, TRANSFERRIN, FERRITIN in the last 72 hours. Studies/Results: Dg Chest Port 1 View  Result Date: 04/07/2019 CLINICAL DATA:   Open-heart surgery. EXAM: PORTABLE CHEST 1 VIEW COMPARISON:  04/06/2019 and CT chest 04/03/2019. FINDINGS: Right IJ catheter sheath remains in place. Left subclavian central line terminates at the brachiocephalic vein junction. Medial right chest tube. Cardiomediastinal silhouette is enlarged, stable. Thoracic aorta is calcified. Mixed interstitial and airspace opacification bilaterally with bibasilar collapse/consolidation and bilateral pleural effusions. No definite pneumothorax. IMPRESSION: Probable congestive heart failure with bibasilar collapse/consolidation. Pneumonia is not excluded. Electronically Signed   By: Lorin Picket M.D.   On: 04/07/2019 08:44   Dg Chest Port 1 View  Result Date: 04/06/2019 CLINICAL DATA:  Central line placement. Pt unable to answer hx questions. EXAM: PORTABLE CHEST 1 VIEW COMPARISON:  04/06/2019 FINDINGS: LEFT-sided subclavian central line has been placed, tip overlying the level of the superior vena cava. RIGHT IJ sheath remains in place. No pneumothorax. Status post median sternotomy. Mediastinal drains remain in place. There is dense opacity at both lung bases partially obscuring the hemidiaphragms. Bilateral pleural effusions are present and appear stable. IMPRESSION: 1. Interval placement of left subclavian central line. No pneumothorax. 2. Stable bilateral pleural effusions and bibasilar opacities. Electronically Signed   By: Nolon Nations M.D.   On: 04/06/2019 17:25   Dg Chest Port 1 View  Result Date: 04/06/2019 CLINICAL DATA:  68 year old female status post recent cardiac surgery. EXAM: PORTABLE CHEST 1 VIEW COMPARISON:  Chest radiograph 04/05/2019 FINDINGS: There has been interval removal of the endotracheal, and enteric tubes as well as removal of the Swan-Ganz catheter. A right-sided chest tube remains  in similar position. There is a right IJ central venous line with tip over upper SVC. There is shallow inspiration. Small bilateral pleural effusions and  associated atelectasis of the adjacent lung bases. Focal area of increased density at the right lung base peripherally may represent atelectasis although pneumonia is not excluded. Clinical correlation is recommended. There is no pneumothorax. Stable cardiomediastinal silhouette. Median sternotomy wires. No acute osseous pathology. IMPRESSION: 1. Interval removal of the endotracheal and enteric tubes, as well as the Swan-Ganz catheter. Right IJ central venous line with tip over upper SVC. No pneumothorax. 2. Small bilateral pleural effusions and associated atelectasis. 3. Focal area of increased density at the right lung base may represent atelectasis although pneumonia is not excluded. Electronically Signed   By: Anner Crete M.D.   On: 04/06/2019 08:43    Medications: Infusions: . sodium chloride 10 mL/hr at 04/07/19 0600  . sodium chloride    . sodium chloride    . albumin human    . cefTRIAXone (ROCEPHIN)  IV Stopped (04/06/19 1610)  . dexmedetomidine (PRECEDEX) IV infusion    . DOPamine    . labetalol (NORMODYNE) infusion Stopped (04/04/19 0957)  . magnesium sulfate    . nitroGLYCERIN 100 mcg/min (04/07/19 0600)  . nitroPRUSSide 0.3 mcg/kg/min (04/07/19 0739)    Scheduled Medications: . sodium chloride   Intravenous Once  . acetaminophen  1,000 mg Oral Q6H   Or  . acetaminophen (TYLENOL) oral liquid 160 mg/5 mL  1,000 mg Per Tube Q6H  . acetaminophen (TYLENOL) oral liquid 160 mg/5 mL  650 mg Per Tube Once   Or  . acetaminophen  650 mg Rectal Once  . aspirin  324 mg Oral Daily  . bupivacaine liposome  20 mL Infiltration Once  . Chlorhexidine Gluconate Cloth  6 each Topical Daily  . Chlorhexidine Gluconate Cloth  6 each Topical Q0600  . Chlorhexidine Gluconate Cloth  6 each Topical Q0600  . docusate  200 mg Oral Daily  . heparin      . insulin aspart  0-24 Units Subcutaneous Q4H  . mouth rinse  15 mL Mouth Rinse BID  . metoprolol tartrate  12.5 mg Oral BID   Or  .  metoprolol tartrate  12.5 mg Per Tube BID  . pantoprazole sodium  40 mg Per Tube Daily  . sennosides  5 mL Oral QHS  . sodium chloride flush  10-40 mL Intracatheter Q12H  . sodium chloride flush  3 mL Intravenous Once  . sodium chloride flush  3 mL Intravenous Q12H    have reviewed scheduled and prn medications.  Physical Exam: General: On BiPAP, increased work of breathing Heart:RRR, s1s2 nl, no rub Lungs: Coarse breath sound bilateral, mild respiratory distress Abdomen:soft, Non-tender, non-distended Extremities:No edema Dialysis Access: Left subclavian IJ catheter was placed on 11/5.  Site clean. Neurology: Alert awake and following commands.  Dron Prasad Bhandari 04/07/2019,9:19 AM  LOS: 4 days  Pager: [phone]

## 2019-04-07 NOTE — Progress Notes (Signed)
      [address]       [place] [address]             [phone]      Confused  BP (!) 162/102   Pulse 81   Temp (!) 97.5 F (36.4 C) (Oral)   Resp (!) 22   Ht 5\' 3"  (1.6 m)   Wt 67 kg   SpO2 97%   BMI 26.17 kg/m  On BIPAP 50% sat of 93%  Intake/Output Summary (Last 24 hours) at 04/07/2019 1726 Last data filed at 04/07/2019 1400 Gross per 24 hour  Intake 388.27 ml  Output 3020 ml  Net -2631.73 ml   Delirium- on low dose Haldol Hypertension- on hydralazine, metoprolol. Off NTG, SNP  Monique Bentley C. Roxan Hockey, MD [facility] [phone]

## 2019-04-08 ENCOUNTER — Inpatient Hospital Stay (HOSPITAL_COMMUNITY): Payer: Medicare Other

## 2019-04-08 LAB — CBC WITH DIFFERENTIAL/PLATELET
Abs Immature Granulocytes: 0.16 10*3/uL — ABNORMAL HIGH (ref 0.00–0.07)
Basophils Absolute: 0 10*3/uL (ref 0.0–0.1)
Basophils Relative: 0 %
Eosinophils Absolute: 0 10*3/uL (ref 0.0–0.5)
Eosinophils Relative: 0 %
HCT: 30.6 % — ABNORMAL LOW (ref 36.0–46.0)
Hemoglobin: 10.1 g/dL — ABNORMAL LOW (ref 12.0–15.0)
Immature Granulocytes: 1 %
Lymphocytes Relative: 5 %
Lymphs Abs: 0.7 10*3/uL (ref 0.7–4.0)
MCH: 29.7 pg (ref 26.0–34.0)
MCHC: 33 g/dL (ref 30.0–36.0)
MCV: 90 fL (ref 80.0–100.0)
Monocytes Absolute: 1.3 10*3/uL — ABNORMAL HIGH (ref 0.1–1.0)
Monocytes Relative: 9 %
Neutro Abs: 12 10*3/uL — ABNORMAL HIGH (ref 1.7–7.7)
Neutrophils Relative %: 85 %
Platelets: 142 10*3/uL — ABNORMAL LOW (ref 150–400)
RBC: 3.4 MIL/uL — ABNORMAL LOW (ref 3.87–5.11)
RDW: 16.7 % — ABNORMAL HIGH (ref 11.5–15.5)
WBC: 14.2 10*3/uL — ABNORMAL HIGH (ref 4.0–10.5)
nRBC: 0.2 % (ref 0.0–0.2)

## 2019-04-08 LAB — GLUCOSE, CAPILLARY
Glucose-Capillary: 88 mg/dL (ref 70–99)
Glucose-Capillary: 111 mg/dL — ABNORMAL HIGH (ref 70–99)
Glucose-Capillary: 98 mg/dL (ref 70–99)
Glucose-Capillary: 104 mg/dL — ABNORMAL HIGH (ref 70–99)
Glucose-Capillary: 111 mg/dL — ABNORMAL HIGH (ref 70–99)

## 2019-04-08 LAB — BASIC METABOLIC PANEL
Anion gap: 13 (ref 5–15)
BUN: 35 mg/dL — ABNORMAL HIGH (ref 8–23)
CO2: 23 mmol/L (ref 22–32)
Calcium: 7.9 mg/dL — ABNORMAL LOW (ref 8.9–10.3)
Chloride: 102 mmol/L (ref 98–111)
Creatinine, Ser: 3.92 mg/dL — ABNORMAL HIGH (ref 0.44–1.00)
GFR calc Af Amer: 13 mL/min — ABNORMAL LOW (ref >60)
GFR calc non Af Amer: 11 mL/min — ABNORMAL LOW (ref >60)
Glucose, Bld: 200 mg/dL — ABNORMAL HIGH (ref 70–99)
Potassium: 3.7 mmol/L (ref 3.5–5.1)
Sodium: 138 mmol/L (ref 135–145)

## 2019-04-08 LAB — HIV ANTIBODY (ROUTINE TESTING W REFLEX): HIV Screen 4th Generation wRfx: NONREACTIVE

## 2019-04-08 MED ORDER — CHLORHEXIDINE GLUCONATE CLOTH 2 % EX PADS: 6.0000 | MEDICATED_PAD | Freq: Every day | CUTANEOUS | Status: DC

## 2019-04-08 MED ORDER — CLONIDINE HCL 0.3 MG/24HR TD PTWK
0.3000 mg | MEDICATED_PATCH | TRANSDERMAL | Status: DC
  Administered 2019-04-08 – 2019-04-22 (×3): 0.3 mg via TRANSDERMAL
  Filled 2019-04-08 (×3): qty 1

## 2019-04-08 NOTE — Progress Notes (Signed)
4 Days Post-Op Procedure(s) (LRB): THORACIC ASCENDING ANEURYSM REPAIR (AAA)  USING 28 MM X 30 CM HEMASHIELD PLATINUM VASCULAR GRAFT (N/A) Transesophageal Echocardiogram (Tee) (N/A) Subjective: On HD currently Asleep at present. Did know name for RN  Objective: Vital signs in last 24 hours: Temp:  [97.5 F (36.4 C)-98.8 F (37.1 C)] 98 F (36.7 C) (11/07 1022) Pulse Rate:  [49-97] 88 (11/07 1015) Cardiac Rhythm: Normal sinus rhythm (11/07 1000) Resp:  [16-38] 20 (11/07 1015) BP: (113-210)/(61-130) 200/107 (11/07 1022) SpO2:  [90 %-100 %] 100 % (11/07 1000) FiO2 (%):  [50 %] 50 % (11/07 0732) Weight:  [64.7 kg] 64.7 kg (11/07 1022)  Hemodynamic parameters for last 24 hours:    Intake/Output from previous day: 11/06 0701 - 11/07 0700 In: 1047.3 [I.V.:947.3; IV Piggyback:100] Out: 1930 [Chest Tube:430] Intake/Output this shift: Total I/O In: 103.8 [I.V.:103.8] Out: 20 [Chest Tube:20]  General appearance: fatigued Neurologic: non focal Heart: regular rate and rhythm Lungs: rhonchi bilaterally  Lab Results: Recent Labs    04/06/19 0250  04/07/19 0833 04/08/19 0459  WBC 12.5*  --   --  14.2*  HGB 10.7*   < > 10.2* 10.1*  HCT 32.1*   < > 30.0* 30.6*  PLT 91*  --   --  142*   < > = values in this interval not displayed.   BMET:  Recent Labs    04/07/19 0554 04/07/19 0833 04/08/19 0459  NA 138 140 138  K 3.7 3.8 3.7  CL 101  --  102  CO2 24  --  23  GLUCOSE 235*  --  200*  BUN 44*  --  35*  CREATININE 4.46*  --  3.92*  CALCIUM 7.4*  --  7.9*    PT/INR: No results for input(s): LABPROT, INR in the last 72 hours. ABG    Component Value Date/Time   PHART 7.407 04/07/2019 0833   HCO3 24.5 04/07/2019 0833   TCO2 26 04/07/2019 0833   ACIDBASEDEF 8.0 (H) 04/06/2019 1748   O2SAT 96.0 04/07/2019 0833   CBG (last 3)  Recent Labs    04/07/19 2340 04/08/19 0351 04/08/19 0830  GLUCAP 136* 88 111*    Assessment/Plan: S/P Procedure(s) (LRB): THORACIC  ASCENDING ANEURYSM REPAIR (AAA)  USING 28 MM X 30 CM HEMASHIELD PLATINUM VASCULAR GRAFT (N/A) Transesophageal Echocardiogram (Tee) (N/A) -Remains crtically ill with multiple organ system failure  CV- s/p repair aortic dissection  Hypertension- refractory. Back on NTG and SNP drips. Will restart labetalol drip and wean others  RESP- acute respiratory failure requiring BIPAP. Marginal respiratory status.   CXR little changed  RENAL- on HD currently  ENDO- CBG well controlled  NEURO- delirium. Slightly better but still hampering overall recovery   LOS: 5 days    [name] 04/08/2019

## 2019-04-08 NOTE — Progress Notes (Signed)
      [address]       [place] [address]             [phone]      Sleeping currently  BP 136/81   Pulse 78   Temp 98 F (36.7 C) (Axillary)   Resp 18   Ht 5\' 3"  (1.6 m)   Wt 62.8 kg   SpO2 100%   BMI 24.53 kg/m    Intake/Output Summary (Last 24 hours) at 04/08/2019 1737 Last data filed at 04/08/2019 1517 Gross per 24 hour  Intake 1187.65 ml  Output 2290 ml  Net -1102.35 ml  2 liters off with HD  Still hypertensive. Was on clonidine preop- will add catapres patch  Remo Lipps C. Roxan Hockey, MD [facility] [phone]

## 2019-04-08 NOTE — Progress Notes (Signed)
[facility] NEPHROLOGY PROGRESS NOTE  Assessment/ Plan: Pt is a 68 y.o. 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  #AKI on CKD stage IV, oliguric due to ischemic ATN in the setting of hypotension: Baseline creatinine level around 2.5-3.  Did not respond with diuretics and became fluid overload.  Started HD on 11/5 via temporary left subclavian catheter.  Remains anuric and fluid overload.  -Second HD on 11/6 with net 1.5 kg UF. -She is still requiring higher amount of oxygen and diffuse crackles.  Follow-up chest x-ray result.  We will plan for another HD today to optimize volume.  Plan for another dialysis today to optimize volume. -UA with UTI, completing 3 days of ceftriaxone today.  Culture negative. -Kidney ultrasound with bilateral cortical thinning, no obstruction.  #Hypertension: On nitroglycerin and nitroprusside.  UF during HD today.  Avoid hypotensive episode.  Recommend to  titrate down nitroprusside.  #Acute respiratory failure probably due to volume overload: Currently on the BiPAP.  Ultrafiltration during dialysis.   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  Per CT surgery.  #Anemia, acute blood loss: Monitor hemoglobin.  Transfuse as needed.  Discussed ICU team.  Subjective: Seen and examined in ICU.  Tolerating dialysis well.  Remains on BiPAP with higher oxygen amount.  Blood pressure high.  No urine output.  Objective Vital signs in last 24 hours: Vitals:   04/08/19 0815 04/08/19 0830 04/08/19 0845 04/08/19 0900  BP: (!) 171/92 (!) 192/107 (!) 197/109 (!) 162/92  Pulse: 87 87 89 86  Resp: 18 18 18  (!) 21  Temp:      TempSrc:      SpO2: 100% 100% 100% 100%  Weight:      Height:       Weight change: -1.8 kg  Intake/Output Summary (Last 24 hours) at 04/08/2019  0907 Last data filed at 04/08/2019 0900 Gross per 24 hour  Intake 1042.67 ml  Output 1930 ml  Net -887.33 ml       Labs: Basic Metabolic Panel: Recent Labs  Lab 04/06/19 0250  04/06/19 1450  04/07/19 0554 04/07/19 0833 04/08/19 0459  NA 141   < > 141   < > 138 140 138  K 4.7   < > 4.6   < > 3.7 3.8 3.7  CL 108  --  103  --  101  --  102  CO2 20*  --  23  --  24  --  23  GLUCOSE 125*  --  166*  --  235*  --  200*  BUN 42*  --  53*  --  44*  --  35*  CREATININE 4.15*  --  5.31*  --  4.46*  --  3.92*  CALCIUM 7.5*  --  7.5*  --  7.4*  --  7.9*  PHOS 9.3*  --  9.9*  --  5.9*  --   --    < > = values in this interval not displayed.   Liver Function Tests: Recent Labs  Lab 04/03/19 1822 04/06/19 1450 04/07/19 0554  AST 24  --   --   ALT 14  --   --   ALKPHOS 56  --   --   BILITOT 0.8  --   --   PROT 5.9*  --   --  ALBUMIN 3.3* 3.2* 2.9*   Recent Labs  Lab 04/03/19 1822  LIPASE 30   No results for input(s): AMMONIA in the last 168 hours. CBC: Recent Labs  Lab 04/03/19 1822  04/05/19 0442  04/05/19 1053  04/05/19 1628  04/06/19 0250  04/06/19 1748 04/07/19 0833 04/08/19 0459  WBC 7.9   < > 10.2  --  11.7*  --  12.4*  --  12.5*  --   --   --  14.2*  NEUTROABS 5.4  --   --   --   --   --   --   --   --   --   --   --  12.0*  HGB 10.8*   < > 8.5*   < > 8.3*   < > 8.6*   < > 10.7*   < > 7.8* 10.2* 10.1*  HCT 34.2*   < > 25.9*   < > 24.6*   < > 25.8*   < > 32.1*   < > 23.0* 30.0* 30.6*  MCV 94.7   < > 89.9  --  87.2  --  89.3  --  90.4  --   --   --  90.0  PLT 305   < > 129*  --  116*  --  116*  --  91*  --   --   --  142*   < > = values in this interval not displayed.   Cardiac Enzymes: No results for input(s): CKTOTAL, CKMB, CKMBINDEX, TROPONINI in the last 168 hours. CBG: Recent Labs  Lab 04/07/19 1700 04/07/19 2046 04/07/19 2340 04/08/19 0351 04/08/19 0830  GLUCAP 122* 137* 136* 88 111*    Iron Studies: No results for input(s): IRON, TIBC,  TRANSFERRIN, FERRITIN in the last 72 hours. Studies/Results: US Renal  Result Date: 04/07/2019 CLINICAL DATA:  Chronic renal disease. EXAM: RENAL / URINARY TRACT ULTRASOUND COMPLETE COMPARISON:  CT 04/03/2019. FINDINGS: Right Kidney: Renal measurements: 9.7 x 4.5 x 5.0 cm = volume: 113.3 mL. Renal cortical thinning. Increased echogenicity. No mass or hydronephrosis visualized. Left Kidney: Renal measurements: 9.5 x 5.9 x 4.6 cm = volume: 136.3 mL. Renal cortical thinning. Increased echogenicity. No mass or hydronephrosis visualized. Bladder: Not visualized.  No bladder distention. Other: Small amount of ascites.  Left pleural effusion incidentally noted. IMPRESSION: 1. Bilateral renal cortical thinning. Bilateral renal increased echogenicity consistent chronic medical renal disease. Small amount of right renal perinephric fluid. No focal renal abnormality identified. No hydronephrosis. 2.  Bladder not visualized.  No bladder distention noted. 3. Small amount of ascites. Left pleural effusion incidentally noted. Electronically Signed   By: Marcello Moores  Register   On: 04/07/2019 15:51   Dg Chest Port 1 View  Result Date: 04/07/2019 CLINICAL DATA:  Open-heart surgery. EXAM: PORTABLE CHEST 1 VIEW COMPARISON:  04/06/2019 and CT chest 04/03/2019. FINDINGS: Right IJ catheter sheath remains in place. Left subclavian central line terminates at the brachiocephalic vein junction. Medial right chest tube. Cardiomediastinal silhouette is enlarged, stable. Thoracic aorta is calcified. Mixed interstitial and airspace opacification bilaterally with bibasilar collapse/consolidation and bilateral pleural effusions. No definite pneumothorax. IMPRESSION: Probable congestive heart failure with bibasilar collapse/consolidation. Pneumonia is not excluded. Electronically Signed   By: Lorin Picket M.D.   On: 04/07/2019 08:44   Dg Chest Port 1 View  Result Date: 04/06/2019 CLINICAL DATA:  Central line placement. Pt unable to answer  hx questions. EXAM: PORTABLE CHEST 1 VIEW COMPARISON:  04/06/2019 FINDINGS: LEFT-sided subclavian central line  has been placed, tip overlying the level of the superior vena cava. RIGHT IJ sheath remains in place. No pneumothorax. Status post median sternotomy. Mediastinal drains remain in place. There is dense opacity at both lung bases partially obscuring the hemidiaphragms. Bilateral pleural effusions are present and appear stable. IMPRESSION: 1. Interval placement of left subclavian central line. No pneumothorax. 2. Stable bilateral pleural effusions and bibasilar opacities. Electronically Signed   By: Nolon Nations M.D.   On: 04/06/2019 17:25    Medications: Infusions: . sodium chloride Stopped (04/08/19 0806)  . sodium chloride    . sodium chloride    . cefTRIAXone (ROCEPHIN)  IV Stopped (04/07/19 1610)  . dexmedetomidine (PRECEDEX) IV infusion    . DOPamine    . labetalol (NORMODYNE) infusion Stopped (04/04/19 0957)  . nitroGLYCERIN 60 mcg/min (04/08/19 0900)  . nitroPRUSSide 0.8 mcg/kg/min (04/08/19 0900)    Scheduled Medications: . sodium chloride   Intravenous Once  . acetaminophen  1,000 mg Oral Q6H   Or  . acetaminophen (TYLENOL) oral liquid 160 mg/5 mL  1,000 mg Per Tube Q6H  . acetaminophen (TYLENOL) oral liquid 160 mg/5 mL  650 mg Per Tube Once   Or  . acetaminophen  650 mg Rectal Once  . aspirin  324 mg Oral Daily  . bupivacaine liposome  20 mL Infiltration Once  . chlorhexidine  15 mL Mouth Rinse BID  . Chlorhexidine Gluconate Cloth  6 each Topical Daily  . Chlorhexidine Gluconate Cloth  6 each Topical Q0600  . Chlorhexidine Gluconate Cloth  6 each Topical Q0600  . Chlorhexidine Gluconate Cloth  6 each Topical Q0600  . docusate  200 mg Oral Daily  . haloperidol lactate  0.5 mg Intravenous Q6H  . insulin aspart  0-24 Units Subcutaneous Q4H  . mouth rinse  15 mL Mouth Rinse q12n4p  . metoprolol tartrate  12.5 mg Oral BID   Or  . metoprolol tartrate  12.5 mg Per  Tube BID  . pantoprazole sodium  40 mg Per Tube Daily  . sennosides  5 mL Oral QHS  . sodium chloride flush  10-40 mL Intracatheter Q12H  . sodium chloride flush  3 mL Intravenous Once  . sodium chloride flush  3 mL Intravenous Q12H    have reviewed scheduled and prn medications.  Physical Exam: No significant change General: On BiPAP, increased work of breathing Heart:RRR, s1s2 nl, no rub Lungs: Coarse breath sound bilateral, mild respiratory distress Abdomen:soft, Non-tender, non-distended Extremities:No edema Dialysis Access: Left subclavian IJ catheter was placed on 11/5.  Site clean. Neurology: Alert awake and following commands.  Monique Henderson Monique Henderson 04/08/2019,9:07 AM  LOS: 5 days  Pager: [phone]

## 2019-04-08 NOTE — Progress Notes (Signed)
SLP Cancellation Note  Patient Details Name: Monique Henderson MRN: [id number] DOB: [birth year]   Cancelled treatment:       Reason Eval/Treat Not Completed: Medical issues which prohibited therapy.  Pt had been on nasal cannula for a short period of time this afternoon; however, she was transitioned back to BiPAP just prior to SLP arrival per RN report.  ST will continue to f/u as appropriate.    Colin Mulders., M.S., [facility] Office: [phone]  [place] 04/08/2019, 2:05 PM

## 2019-04-09 ENCOUNTER — Inpatient Hospital Stay (HOSPITAL_COMMUNITY): Payer: Medicare Other

## 2019-04-09 LAB — CBC WITH DIFFERENTIAL/PLATELET
Abs Immature Granulocytes: 0.05 10*3/uL (ref 0.00–0.07)
Basophils Absolute: 0 10*3/uL (ref 0.0–0.1)
Basophils Relative: 0 %
Eosinophils Absolute: 0.1 10*3/uL (ref 0.0–0.5)
Eosinophils Relative: 1 %
HCT: 28 % — ABNORMAL LOW (ref 36.0–46.0)
Hemoglobin: 9.2 g/dL — ABNORMAL LOW (ref 12.0–15.0)
Immature Granulocytes: 1 %
Lymphocytes Relative: 8 %
Lymphs Abs: 0.8 10*3/uL (ref 0.7–4.0)
MCH: 29.8 pg (ref 26.0–34.0)
MCHC: 32.9 g/dL (ref 30.0–36.0)
MCV: 90.6 fL (ref 80.0–100.0)
Monocytes Absolute: 1 10*3/uL (ref 0.1–1.0)
Monocytes Relative: 10 %
Neutro Abs: 7.8 10*3/uL — ABNORMAL HIGH (ref 1.7–7.7)
Neutrophils Relative %: 80 %
Platelets: 137 10*3/uL — ABNORMAL LOW (ref 150–400)
RBC: 3.09 MIL/uL — ABNORMAL LOW (ref 3.87–5.11)
RDW: 16.6 % — ABNORMAL HIGH (ref 11.5–15.5)
WBC: 9.7 10*3/uL (ref 4.0–10.5)
nRBC: 0 % (ref 0.0–0.2)

## 2019-04-09 LAB — GLUCOSE, CAPILLARY
Glucose-Capillary: 113 mg/dL — ABNORMAL HIGH (ref 70–99)
Glucose-Capillary: 115 mg/dL — ABNORMAL HIGH (ref 70–99)
Glucose-Capillary: 119 mg/dL — ABNORMAL HIGH (ref 70–99)
Glucose-Capillary: 127 mg/dL — ABNORMAL HIGH (ref 70–99)
Glucose-Capillary: 136 mg/dL — ABNORMAL HIGH (ref 70–99)
Glucose-Capillary: 157 mg/dL — ABNORMAL HIGH (ref 70–99)

## 2019-04-09 LAB — COMPREHENSIVE METABOLIC PANEL
ALT: 251 U/L — ABNORMAL HIGH (ref 0–44)
AST: 143 U/L — ABNORMAL HIGH (ref 15–41)
Albumin: 2.7 g/dL — ABNORMAL LOW (ref 3.5–5.0)
Alkaline Phosphatase: 71 U/L (ref 38–126)
Anion gap: 15 (ref 5–15)
BUN: 36 mg/dL — ABNORMAL HIGH (ref 8–23)
CO2: 22 mmol/L (ref 22–32)
Calcium: 8.7 mg/dL — ABNORMAL LOW (ref 8.9–10.3)
Chloride: 97 mmol/L — ABNORMAL LOW (ref 98–111)
Creatinine, Ser: 4.08 mg/dL — ABNORMAL HIGH (ref 0.44–1.00)
GFR calc Af Amer: 12 mL/min — ABNORMAL LOW (ref >60)
GFR calc non Af Amer: 11 mL/min — ABNORMAL LOW (ref >60)
Glucose, Bld: 128 mg/dL — ABNORMAL HIGH (ref 70–99)
Potassium: 3.4 mmol/L — ABNORMAL LOW (ref 3.5–5.1)
Sodium: 134 mmol/L — ABNORMAL LOW (ref 135–145)
Total Bilirubin: 1.1 mg/dL (ref 0.3–1.2)
Total Protein: 5 g/dL — ABNORMAL LOW (ref 6.5–8.1)

## 2019-04-09 MED ORDER — ORAL CARE MOUTH RINSE
15.0000 mL | Freq: Two times a day (BID) | OROMUCOSAL | Status: DC
  Administered 2019-04-09 – 2019-04-23 (×16): 15 mL via OROMUCOSAL

## 2019-04-09 MED ORDER — POTASSIUM CHLORIDE 10 MEQ/100ML IV SOLN: 10.0000 meq | INTRAVENOUS | Status: DC

## 2019-04-09 MED ORDER — HALOPERIDOL LACTATE 5 MG/ML IJ SOLN: 0.5000 mg | Freq: Four times a day (QID) | INTRAMUSCULAR | Status: DC | PRN

## 2019-04-09 MED ORDER — INSULIN ASPART 100 UNIT/ML ~~LOC~~ SOLN
0.0000 [IU] | Freq: Three times a day (TID) | SUBCUTANEOUS | Status: DC
  Administered 2019-04-09: 2 [IU] via SUBCUTANEOUS
  Administered 2019-04-09: 16:00:00 1 [IU] via SUBCUTANEOUS
  Administered 2019-04-10: 13:00:00 2 [IU] via SUBCUTANEOUS
  Administered 2019-04-10 (×2): 1 [IU] via SUBCUTANEOUS
  Administered 2019-04-11: 17:00:00 2 [IU] via SUBCUTANEOUS
  Administered 2019-04-11 – 2019-04-14 (×4): 1 [IU] via SUBCUTANEOUS
  Administered 2019-04-15 (×2): 2 [IU] via SUBCUTANEOUS
  Administered 2019-04-16 – 2019-04-17 (×3): 1 [IU] via SUBCUTANEOUS

## 2019-04-09 MED ORDER — POTASSIUM CHLORIDE 10 MEQ/50ML IV SOLN
10.0000 meq | INTRAVENOUS | Status: AC
  Administered 2019-04-09 (×2): 10 meq via INTRAVENOUS
  Filled 2019-04-09 (×2): qty 50

## 2019-04-09 NOTE — Progress Notes (Signed)
[facility] NEPHROLOGY PROGRESS NOTE  Assessment/ Plan: Pt is a 68 y.o. 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  #AKI on CKD stage IV, oliguric due to ischemic ATN in the setting of hypotension: Baseline creatinine level around 2.5-3.  Did not respond with diuretics and became fluid overload.  Started HD on 11/5 via temporary left subclavian catheter.  -Received 3 dialysis treatments, last HD yesterday with 2 L UF. She remains oliguric.  She does not look fluid overload on exam.  No plan for HD today.  We will assess tomorrow morning for dialysis need.  Monitor urine output and BMP. -UA with UTI, completing 3 days of ceftriaxone today.  Culture negative. -Kidney ultrasound with bilateral cortical thinning, no obstruction.  #Hypertension: On clonidine and labetalol.  Avoid hypotensive episode.  #Acute respiratory failure probably due to volume overload: Currently on the BiPAP with 100% saturation.  May be able to wean to nasal cannula.  Follow-up chest x-ray.  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  Per CT surgery.  #Anemia, acute blood loss: Monitor hemoglobin.  Transfuse as needed.  Discussed ICU team.  Subjective: Seen and examined in ICU.  Tolerating dialysis well.  On BiPAP with 100% saturation.  Remains oliguric.  Review of system limited.  Objective Vital signs in last 24 hours: Vitals:   04/09/19 0600 04/09/19 0700 04/09/19 0800 04/09/19 0900  BP: (!) 157/82 (!) 155/76 (!) 180/88 (!) 165/87  Pulse: 71 71 69 71  Resp: (!) 24 16 15  (!) 21  Temp:  (!) 95.9 F (35.5 C)    TempSrc:  Axillary    SpO2: 100% 100% 100% 100%  Weight:      Height:       Weight change: -2.3 kg  Intake/Output Summary (Last 24 hours) at 04/09/2019 0908 Last data filed at 04/09/2019 0700 Gross per 24  hour  Intake 514.34 ml  Output 2170 ml  Net -1655.66 ml       Labs: Basic Metabolic Panel: Recent Labs  Lab 04/06/19 0250  04/06/19 1450  04/07/19 0554 04/07/19 0833 04/08/19 0459  NA 141   < > 141   < > 138 140 138  K 4.7   < > 4.6   < > 3.7 3.8 3.7  CL 108  --  103  --  101  --  102  CO2 20*  --  23  --  24  --  23  GLUCOSE 125*  --  166*  --  235*  --  200*  BUN 42*  --  53*  --  44*  --  35*  CREATININE 4.15*  --  5.31*  --  4.46*  --  3.92*  CALCIUM 7.5*  --  7.5*  --  7.4*  --  7.9*  PHOS 9.3*  --  9.9*  --  5.9*  --   --    < > = values in this interval not displayed.   Liver Function Tests: Recent Labs  Lab 04/03/19 1822 04/06/19 1450 04/07/19 0554  AST 24  --   --   ALT 14  --   --   ALKPHOS 56  --   --   BILITOT 0.8  --   --   PROT 5.9*  --   --   ALBUMIN 3.3* 3.2* 2.9*  Recent Labs  Lab 04/03/19 1822  LIPASE 30   No results for input(s): AMMONIA in the last 168 hours. CBC: Recent Labs  Lab 04/03/19 1822  04/05/19 1053  04/05/19 1628  04/06/19 0250  04/07/19 0833 04/08/19 0459 04/09/19 0852  WBC 7.9   < > 11.7*  --  12.4*  --  12.5*  --   --  14.2* 9.7  NEUTROABS 5.4  --   --   --   --   --   --   --   --  12.0* 7.8*  HGB 10.8*   < > 8.3*   < > 8.6*   < > 10.7*   < > 10.2* 10.1* 9.2*  HCT 34.2*   < > 24.6*   < > 25.8*   < > 32.1*   < > 30.0* 30.6* 28.0*  MCV 94.7   < > 87.2  --  89.3  --  90.4  --   --  90.0 90.6  PLT 305   < > 116*  --  116*  --  91*  --   --  142* 137*   < > = values in this interval not displayed.   Cardiac Enzymes: No results for input(s): CKTOTAL, CKMB, CKMBINDEX, TROPONINI in the last 168 hours. CBG: Recent Labs  Lab 04/08/19 1653 04/08/19 2058 04/09/19 0022 04/09/19 0419 04/09/19 0844  GLUCAP 104* 111* 119* 136* 115*    Iron Studies: No results for input(s): IRON, TIBC, TRANSFERRIN, FERRITIN in the last 72 hours. Studies/Results: US Renal  Result Date: 04/07/2019 CLINICAL DATA:  Chronic renal  disease. EXAM: RENAL / URINARY TRACT ULTRASOUND COMPLETE COMPARISON:  CT 04/03/2019. FINDINGS: Right Kidney: Renal measurements: 9.7 x 4.5 x 5.0 cm = volume: 113.3 mL. Renal cortical thinning. Increased echogenicity. No mass or hydronephrosis visualized. Left Kidney: Renal measurements: 9.5 x 5.9 x 4.6 cm = volume: 136.3 mL. Renal cortical thinning. Increased echogenicity. No mass or hydronephrosis visualized. Bladder: Not visualized.  No bladder distention. Other: Small amount of ascites.  Left pleural effusion incidentally noted. IMPRESSION: 1. Bilateral renal cortical thinning. Bilateral renal increased echogenicity consistent chronic medical renal disease. Small amount of right renal perinephric fluid. No focal renal abnormality identified. No hydronephrosis. 2.  Bladder not visualized.  No bladder distention noted. 3. Small amount of ascites. Left pleural effusion incidentally noted. Electronically Signed   By: Marcello Moores  Register   On: 04/07/2019 15:51   Dg Chest Port 1 View  Result Date: 04/08/2019 CLINICAL DATA:  Hx open heart surgery 04/04/19 - THORACIC ASCENDING ANEURYSM REPAIR (AAA) EXAM: PORTABLE CHEST - 1 VIEW COMPARISON:  04/07/2019 FINDINGS: Right IJ venous sheath, left subclavian dialysis catheter stable in position. Stable right chest tube without pneumothorax. Slight worsening of patchy perihilar airspace opacities with peripheral sparing. Improved aeration at the lung bases however. Heart size upper limits normal for technique. Aortic Atherosclerosis (ICD10-170.0). No definite effusion. Sternotomy wires. Surgical clips right upper abdomen. IMPRESSION: 1. Stable right chest tube with no pneumothorax. 2. Slight worsening of perihilar airspace opacities. Electronically Signed   By: Lucrezia Europe M.D.   On: 04/08/2019 11:08    Medications: Infusions: . sodium chloride 5 mL/hr at 04/09/19 0600  . sodium chloride    . sodium chloride    . dexmedetomidine (PRECEDEX) IV infusion    . DOPamine    .  labetalol (NORMODYNE) infusion 0.5 mg/min (04/09/19 0441)  . nitroGLYCERIN Stopped (04/08/19 1235)  . nitroPRUSSide Stopped (04/08/19 1201)    Scheduled  Medications: . sodium chloride   Intravenous Once  . acetaminophen  1,000 mg Oral Q6H   Or  . acetaminophen (TYLENOL) oral liquid 160 mg/5 mL  1,000 mg Per Tube Q6H  . acetaminophen (TYLENOL) oral liquid 160 mg/5 mL  650 mg Per Tube Once   Or  . acetaminophen  650 mg Rectal Once  . aspirin  324 mg Oral Daily  . bupivacaine liposome  20 mL Infiltration Once  . chlorhexidine  15 mL Mouth Rinse BID  . Chlorhexidine Gluconate Cloth  6 each Topical Daily  . Chlorhexidine Gluconate Cloth  6 each Topical Q0600  . Chlorhexidine Gluconate Cloth  6 each Topical Q0600  . Chlorhexidine Gluconate Cloth  6 each Topical Q0600  . cloNIDine  0.3 mg Transdermal Weekly  . docusate  200 mg Oral Daily  . haloperidol lactate  0.5 mg Intravenous Q6H  . insulin aspart  0-24 Units Subcutaneous Q4H  . mouth rinse  15 mL Mouth Rinse q12n4p  . metoprolol tartrate  12.5 mg Oral BID   Or  . metoprolol tartrate  12.5 mg Per Tube BID  . pantoprazole sodium  40 mg Per Tube Daily  . sennosides  5 mL Oral QHS  . sodium chloride flush  10-40 mL Intracatheter Q12H  . sodium chloride flush  3 mL Intravenous Once  . sodium chloride flush  3 mL Intravenous Q12H    have reviewed scheduled and prn medications.  Physical Exam: No significant change General: On BiPAP, not in distress  heart:RRR, s1s2 nl, no rub Lungs: Coarse breath sound bilateral, no wheezing Abdomen:soft, Non-tender, non-distended Extremities:No LE edema Dialysis Access: Left subclavian IJ catheter was placed on 11/5.  Site clean. Neurology: Alert awake and following commands.  Sham Alviar Reesa Chew Minta Fair 04/09/2019,9:08 AM  LOS: 6 days  Pager: [phone]

## 2019-04-09 NOTE — Progress Notes (Signed)
5 Days Post-Op Procedure(s) (LRB): THORACIC ASCENDING ANEURYSM REPAIR (AAA)  USING 28 MM X 30 CM HEMASHIELD PLATINUM VASCULAR GRAFT (N/A) Transesophageal Echocardiogram (Tee) (N/A) Subjective: Opens eyes and answers yes/ no questions Was more alert and interactive with RN earlier  Objective: Vital signs in last 24 hours: Temp:  [95.9 F (35.5 C)-98 F (36.7 C)] 95.9 F (35.5 C) (11/08 0700) Pulse Rate:  [61-104] 67 (11/08 1030) Cardiac Rhythm: Normal sinus rhythm (11/08 0800) Resp:  [10-35] 19 (11/08 1030) BP: (92-180)/(50-110) 123/73 (11/08 1030) SpO2:  [98 %-100 %] 100 % (11/08 1030) FiO2 (%):  [50 %] 50 % (11/08 0323) Weight:  [62.5 kg-62.8 kg] 62.5 kg (11/08 0435)  Hemodynamic parameters for last 24 hours:    Intake/Output from previous day: 11/07 0701 - 11/08 0700 In: 582.7 [I.V.:482.7; IV Piggyback:100] Out: 2170 [Urine:50; Chest Tube:120] Intake/Output this shift: Total I/O In: 142.4 [P.O.:120; I.V.:22.4] Out: 100 [Urine:50; Chest Tube:50]  General appearance: cooperative and slowed mentation Neurologic: nonfocal Heart: regular rate and rhythm Lungs: diminished breath sounds bibasilar Abdomen: normal findings: soft, non-tender  Lab Results: Recent Labs    04/08/19 0459 04/09/19 0852  WBC 14.2* 9.7  HGB 10.1* 9.2*  HCT 30.6* 28.0*  PLT 142* 137*   BMET:  Recent Labs    04/08/19 0459 04/09/19 0852  NA 138 134*  K 3.7 3.4*  CL 102 97*  CO2 23 22  GLUCOSE 200* 128*  BUN 35* 36*  CREATININE 3.92* 4.08*  CALCIUM 7.9* 8.7*    PT/INR: No results for input(s): LABPROT, INR in the last 72 hours. ABG    Component Value Date/Time   PHART 7.407 04/07/2019 0833   HCO3 24.5 04/07/2019 0833   TCO2 26 04/07/2019 0833   ACIDBASEDEF 8.0 (H) 04/06/2019 1748   O2SAT 96.0 04/07/2019 0833   CBG (last 3)  Recent Labs    04/09/19 0022 04/09/19 0419 04/09/19 0844  GLUCAP 119* 136* 115*    Assessment/Plan: S/P Procedure(s) (LRB): THORACIC ASCENDING  ANEURYSM REPAIR (AAA)  USING 28 MM X 30 CM HEMASHIELD PLATINUM VASCULAR GRAFT (N/A) Transesophageal Echocardiogram (Tee) (N/A) -Significant improvement over past 24 hours CV- BP better controlled after adding catapres, labetalol drip on hold  Was able to take metoprolol PO RESP- Acute respiratory failure improved off BIPAP RENAL- AKI on stage IV CKD- HD yesterday with 2L off  No HD today, Nephrology following  K 3.4 follow ENDO- CBG well controlled, change to AC/HS Nutrition- was able to eat a small amount this AM NEURO- delirium, improving- change Haldol to PRN    LOS: 6 days    Monique Henderson 04/09/2019

## 2019-04-09 NOTE — Progress Notes (Signed)
      [address]       [place] [address]             [phone]      Stable day No new issues  Was down to 2L , now on BIPAP for sleep  Remo Lipps C. Roxan Hockey, MD [facility] [phone]

## 2019-04-09 NOTE — Plan of Care (Signed)
  Problem: Clinical Measurements: Goal: Ability to maintain clinical measurements within normal limits will improve Outcome: Progressing Goal: Will remain free from infection Outcome: Progressing Goal: Respiratory complications will improve Outcome: Progressing Able to tolerate nasal cannula & be off BiPap

## 2019-04-10 ENCOUNTER — Inpatient Hospital Stay (HOSPITAL_COMMUNITY): Payer: Medicare Other

## 2019-04-10 LAB — BASIC METABOLIC PANEL
Anion gap: 24 — ABNORMAL HIGH (ref 5–15)
BUN: 46 mg/dL — ABNORMAL HIGH (ref 8–23)
CO2: 25 mmol/L (ref 22–32)
Calcium: 9.1 mg/dL (ref 8.9–10.3)
Chloride: 94 mmol/L — ABNORMAL LOW (ref 98–111)
Creatinine, Ser: 4.9 mg/dL — ABNORMAL HIGH (ref 0.44–1.00)
GFR calc Af Amer: 10 mL/min — ABNORMAL LOW (ref >60)
GFR calc non Af Amer: 9 mL/min — ABNORMAL LOW (ref >60)
Glucose, Bld: 118 mg/dL — ABNORMAL HIGH (ref 70–99)
Potassium: 4.1 mmol/L (ref 3.5–5.1)
Sodium: 143 mmol/L (ref 135–145)

## 2019-04-10 LAB — CBC WITH DIFFERENTIAL/PLATELET
Abs Immature Granulocytes: 0.04 10*3/uL (ref 0.00–0.07)
Basophils Absolute: 0 10*3/uL (ref 0.0–0.1)
Basophils Relative: 0 %
Eosinophils Absolute: 0.3 10*3/uL (ref 0.0–0.5)
Eosinophils Relative: 3 %
HCT: 29 % — ABNORMAL LOW (ref 36.0–46.0)
Hemoglobin: 9.2 g/dL — ABNORMAL LOW (ref 12.0–15.0)
Immature Granulocytes: 1 %
Lymphocytes Relative: 10 %
Lymphs Abs: 0.9 10*3/uL (ref 0.7–4.0)
MCH: 29.7 pg (ref 26.0–34.0)
MCHC: 31.7 g/dL (ref 30.0–36.0)
MCV: 93.5 fL (ref 80.0–100.0)
Monocytes Absolute: 1 10*3/uL (ref 0.1–1.0)
Monocytes Relative: 12 %
Neutro Abs: 6.6 10*3/uL (ref 1.7–7.7)
Neutrophils Relative %: 74 %
Platelets: 147 10*3/uL — ABNORMAL LOW (ref 150–400)
RBC: 3.1 MIL/uL — ABNORMAL LOW (ref 3.87–5.11)
RDW: 16.6 % — ABNORMAL HIGH (ref 11.5–15.5)
WBC: 8.8 10*3/uL (ref 4.0–10.5)
nRBC: 0 % (ref 0.0–0.2)

## 2019-04-10 LAB — GLUCOSE, CAPILLARY
Glucose-Capillary: 126 mg/dL — ABNORMAL HIGH (ref 70–99)
Glucose-Capillary: 155 mg/dL — ABNORMAL HIGH (ref 70–99)
Glucose-Capillary: 142 mg/dL — ABNORMAL HIGH (ref 70–99)

## 2019-04-10 MED ORDER — PANTOPRAZOLE SODIUM 40 MG PO TBEC
40.0000 mg | DELAYED_RELEASE_TABLET | Freq: Every day | ORAL | Status: DC
  Administered 2019-04-10 – 2019-04-23 (×13): 40 mg via ORAL
  Filled 2019-04-10 (×14): qty 1

## 2019-04-10 MED ORDER — SENNA 8.6 MG PO TABS
1.0000 | ORAL_TABLET | Freq: Every day | ORAL | Status: DC
  Administered 2019-04-10 – 2019-04-11 (×2): 8.6 mg via ORAL
  Filled 2019-04-10 (×2): qty 1

## 2019-04-10 MED ORDER — CHLORHEXIDINE GLUCONATE CLOTH 2 % EX PADS
6.0000 | MEDICATED_PAD | Freq: Every day | CUTANEOUS | Status: DC
  Administered 2019-04-11 – 2019-04-23 (×11): 6 via TOPICAL

## 2019-04-10 MED ORDER — DOCUSATE SODIUM 100 MG PO CAPS
100.0000 mg | ORAL_CAPSULE | Freq: Every day | ORAL | Status: DC
  Administered 2019-04-10 – 2019-04-22 (×10): 100 mg via ORAL
  Filled 2019-04-10 (×14): qty 1

## 2019-04-10 NOTE — Progress Notes (Signed)
[name] PROGRESS NOTE  Assessment/ Plan: Pt is a 68 y.o. 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  #Dialysis dependent AKI on CKD stage IV, oliguric due to ischemic ATN in the setting of hypotension: Baseline creatinine level around 2.5-3.  Did not respond with diuretics and became fluid overload.  Started HD on 11/5 via temporary left subclavian catheter, last treatment 11/7.  Very little urine output, creatinine continues to rise, likely to require ongoing dialysis, will plan for next treatment on 11/10: 2K, 3 hours, 2 L UF, using temporary left subclavian HD catheter.  No heparin -UA with UTI, management per primary -Kidney ultrasound with bilateral cortical thinning, no obstruction.  #Hypertension: On clonidine and BB.  Avoid hypotensive episode.  #Acute respiratory failure probably due to volume overload: Stable, on 2 to 4 L nasal cannula, BiPAP at night.    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  Per CT surgery.  #Anemia, acute blood loss: Monitor hemoglobin.  Transfuse as needed.   Subjective: Seen and examined in ICU.  Tolerating dialysis well.  On BiPAP with 100% saturation.  Remains oliguric.  Review of system limited.  Objective Vital signs in last 24 hours: Vitals:   04/10/19 0600 04/10/19 0630 04/10/19 0700 04/10/19 0800  BP: 127/74 134/76 (!) 148/62 138/77  Pulse: 68 69 72 72  Resp: 18 17 (!) 23 19  Temp:      TempSrc:      SpO2: 100% 100% 99% 100%  Weight:      Height:       Weight change: -0.8 kg  Intake/Output Summary (Last 24 hours) at 04/10/2019 0911 Last data filed at 04/10/2019 0800 Gross per 24 hour  Intake 799.71 ml  Output 200 ml  Net 599.71 ml       Labs: Basic Metabolic Panel: Recent Labs  Lab 04/06/19 0250  04/06/19 1450   04/07/19 0554  04/08/19 0459 04/09/19 0852 04/10/19 0327  NA 141   < > 141   < > 138   < > 138 134* 143  K 4.7   < > 4.6   < > 3.7   < > 3.7 3.4* 4.1  CL 108  --  103  --  101  --  102 97* 94*  CO2 20*  --  23  --  24  --  23 22 25   GLUCOSE 125*  --  166*  --  235*  --  200* 128* 118*  BUN 42*  --  53*  --  44*  --  35* 36* 46*  CREATININE 4.15*  --  5.31*  --  4.46*  --  3.92* 4.08* 4.90*  CALCIUM 7.5*  --  7.5*  --  7.4*  --  7.9* 8.7* 9.1  PHOS 9.3*  --  9.9*  --  5.9*  --   --   --   --    < > = values in this interval not displayed.   Liver Function Tests: Recent Labs  Lab 04/03/19 1822 04/06/19 1450 04/07/19 0554 04/09/19 0852  AST 24  --   --  143*  ALT 14  --   --  251*  ALKPHOS 56  --   --  71  BILITOT 0.8  --   --  1.1  PROT 5.9*  --   --  5.0*  ALBUMIN 3.3* 3.2* 2.9* 2.7*   Recent Labs  Lab 04/03/19 1822  LIPASE 30   No results for input(s): AMMONIA in the last 168 hours. CBC: Recent Labs  Lab 04/05/19 1628  04/06/19 0250  04/08/19 0459 04/09/19 0852 04/10/19 0327  WBC 12.4*  --  12.5*  --  14.2* 9.7 8.8  NEUTROABS  --   --   --   --  12.0* 7.8* 6.6  HGB 8.6*   < > 10.7*   < > 10.1* 9.2* 9.2*  HCT 25.8*   < > 32.1*   < > 30.6* 28.0* 29.0*  MCV 89.3  --  90.4  --  90.0 90.6 93.5  PLT 116*  --  91*  --  142* 137* 147*   < > = values in this interval not displayed.   Cardiac Enzymes: No results for input(s): CKTOTAL, CKMB, CKMBINDEX, TROPONINI in the last 168 hours. CBG: Recent Labs  Lab 04/09/19 0844 04/09/19 1147 04/09/19 1606 04/09/19 2228 04/10/19 0655  GLUCAP 115* 157* 127* 113* 126*    Iron Studies: No results for input(s): IRON, TIBC, TRANSFERRIN, FERRITIN in the last 72 hours. Studies/Results: Dg Chest 1 View  Result Date: 04/09/2019 CLINICAL DATA:  Over are surgery. EXAM: CHEST  1 VIEW COMPARISON:  04/08/2019, 04/07/2019, 04/06/2019 FINDINGS: Right jugular sheath in satisfactory unchanged position. Left-sided subclavian central  venous catheter with the tip projecting over the upper SVC. Right-sided chest tube in unchanged position. No pneumothorax. No pleural effusion. Bilateral mild interstitial thickening and patchy alveolar airspace opacities. Stable cardiomegaly. Prior CABG. No acute osseous abnormality. IMPRESSION: 1. Bilateral interstitial and patchy alveolar airspace opacities consistent with improving pulmonary edema. 2. Right-sided chest tube without a pneumothorax. 3. Right jugular sheath in satisfactory unchanged position. Left-sided subclavian central venous catheter with the tip projecting over the upper SVC. Electronically Signed   By: Kathreen Devoid   On: 04/09/2019 14:26   Dg Chest Port 1 View  Result Date: 04/10/2019 CLINICAL DATA:  Shortness of breath. EXAM: PORTABLE CHEST 1 VIEW COMPARISON:  April 09, 2019. FINDINGS: Stable cardiomegaly. Right internal jugular and left subclavian catheters are unchanged. No pneumothorax is noted. Mild bibasilar opacities are noted concerning for atelectasis or infiltrates. Probable left pleural effusion is noted. Bony thorax is unremarkable. IMPRESSION: Stable support apparatus. Increased bibasilar opacities concerning for atelectasis or infiltrate. Electronically Signed   By: Marijo Conception M.D.   On: 04/10/2019 07:41    Medications: Infusions: . sodium chloride Stopped (04/09/19 0854)  . sodium chloride    . sodium chloride    . dexmedetomidine (PRECEDEX) IV infusion    . DOPamine    . labetalol (NORMODYNE) infusion 0.5 mg/min (04/09/19 1904)  . nitroGLYCERIN Stopped (04/08/19 1235)  . nitroPRUSSide Stopped (04/08/19 1201)    Scheduled Medications: . sodium chloride   Intravenous Once  . acetaminophen (TYLENOL) oral liquid 160 mg/5 mL  650 mg Per Tube Once   Or  . acetaminophen  650 mg Rectal Once  . aspirin  324 mg Oral Daily  . bupivacaine liposome  20 mL Infiltration Once  . Chlorhexidine Gluconate Cloth  6 each Topical Daily  . Chlorhexidine Gluconate  Cloth  6 each Topical Q0600  . Chlorhexidine Gluconate Cloth  6 each Topical Q0600  . Chlorhexidine Gluconate Cloth  6 each Topical Q0600  . cloNIDine  0.3 mg Transdermal Weekly  . docusate  200 mg Oral Daily  . insulin aspart  0-9 Units Subcutaneous TID WC  .  mouth rinse  15 mL Mouth Rinse BID  . metoprolol tartrate  12.5 mg Oral BID   Or  . metoprolol tartrate  12.5 mg Per Tube BID  . pantoprazole sodium  40 mg Per Tube Daily  . sennosides  5 mL Oral QHS  . sodium chloride flush  10-40 mL Intracatheter Q12H  . sodium chloride flush  3 mL Intravenous Once  . sodium chloride flush  3 mL Intravenous Q12H    have reviewed scheduled and prn medications.  Physical Exam: No significant change General: On BiPAP, not in distress  heart:RRR, s1s2 nl, no rub Lungs: Coarse breath sound bilateral, no wheezing Abdomen:soft, Non-tender, non-distended Extremities:No LE edema Dialysis Access: Left subclavian IJ catheter was placed on 11/5.  Site clean. Neurology: Alert awake and following commands.  Monique Henderson B Marlaina Coburn 04/10/2019,9:11 AM  LOS: 7 days  Pager: [phone]

## 2019-04-10 NOTE — Progress Notes (Signed)
Patient ID: Monique Henderson, female   DOB: 10-31-1950, 68 y.o.   MRN: [id number] TCTS Evening Rounds:  Hemodynamically stable in sinus rhythm. sats 100%  Urine output ok Awake and alert

## 2019-04-10 NOTE — Progress Notes (Signed)
  Speech Language Pathology Treatment: Dysphagia  Patient Details Name: Monique Henderson MRN: [id number] DOB: 1951/02/12 Today's Date: 04/10/2019 Time: 0852-0900 SLP Time Calculation (min) (ACUTE ONLY): 8 min  Assessment / Plan / Recommendation Clinical Impression  F/u after initial clinical swallow assessment last week. Pt more alert, on nasal cannula this am.  Improved mentation since last seen by SLP service.  Her diet has been advanced to regular solids.  This am, she accepted regular solids, sips of thin liquid with improved attention, no further incoordination, no further s/s of dysphagia with minimal risk of aspiration.  Recommend continuing regular solids, thin liquids; no SLP f/u is needed - our service will sign off. D/W RN.  HPI HPI: 68yo female admitted with severe chest pain. 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 Pt intubated for surgery, extubated 11/4. PMH: uncontrolled HTN, CKD4, hyperkalemia, breast cancer s/p rad tx, GERD, hepC      SLP Plan  All goals met       Recommendations  Diet recommendations: Regular;Thin liquid Liquids provided via: Cup;Straw Medication Administration: Whole meds with liquid Supervision: Patient able to self feed Postural Changes and/or Swallow Maneuvers: Seated upright 90 degrees                Oral Care Recommendations: Oral care BID Follow up Recommendations: None SLP Visit Diagnosis: Dysphagia, oropharyngeal phase (R13.12) Plan: All goals met       GO               Akeema Broder L. Tivis Ringer, [facility] Office number [phone] Pager [phone]  Juan Quam Laurice 04/10/2019, 9:29 AM

## 2019-04-10 NOTE — Progress Notes (Signed)
Patient off BiPap, currently on 4 lpm cannula.  Patient in NAD, no c/o shortness of breath.  BBS clear, diminished.  RT to continue to monitor.

## 2019-04-11 LAB — RENAL FUNCTION PANEL
Albumin: 2.7 g/dL — ABNORMAL LOW (ref 3.5–5.0)
Anion gap: 11 (ref 5–15)
BUN: 57 mg/dL — ABNORMAL HIGH (ref 8–23)
CO2: 26 mmol/L (ref 22–32)
Calcium: 8.4 mg/dL — ABNORMAL LOW (ref 8.9–10.3)
Chloride: 101 mmol/L (ref 98–111)
Creatinine, Ser: 4.92 mg/dL — ABNORMAL HIGH (ref 0.44–1.00)
GFR calc Af Amer: 10 mL/min — ABNORMAL LOW (ref >60)
GFR calc non Af Amer: 9 mL/min — ABNORMAL LOW (ref >60)
Glucose, Bld: 147 mg/dL — ABNORMAL HIGH (ref 70–99)
Phosphorus: 4.6 mg/dL (ref 2.5–4.6)
Potassium: 3.9 mmol/L (ref 3.5–5.1)
Sodium: 138 mmol/L (ref 135–145)

## 2019-04-11 LAB — CBC
HCT: 31.2 % — ABNORMAL LOW (ref 36.0–46.0)
Hemoglobin: 10 g/dL — ABNORMAL LOW (ref 12.0–15.0)
MCH: 29.9 pg (ref 26.0–34.0)
MCHC: 32.1 g/dL (ref 30.0–36.0)
MCV: 93.1 fL (ref 80.0–100.0)
Platelets: 174 10*3/uL (ref 150–400)
RBC: 3.35 MIL/uL — ABNORMAL LOW (ref 3.87–5.11)
RDW: 16.4 % — ABNORMAL HIGH (ref 11.5–15.5)
WBC: 11.8 10*3/uL — ABNORMAL HIGH (ref 4.0–10.5)
nRBC: 0 % (ref 0.0–0.2)

## 2019-04-11 LAB — GLUCOSE, CAPILLARY
Glucose-Capillary: 118 mg/dL — ABNORMAL HIGH (ref 70–99)
Glucose-Capillary: 132 mg/dL — ABNORMAL HIGH (ref 70–99)
Glucose-Capillary: 162 mg/dL — ABNORMAL HIGH (ref 70–99)
Glucose-Capillary: 112 mg/dL — ABNORMAL HIGH (ref 70–99)

## 2019-04-11 MED ORDER — NEPRO/CARBSTEADY PO LIQD
237.0000 mL | Freq: Two times a day (BID) | ORAL | Status: DC
  Administered 2019-04-11 – 2019-04-17 (×4): 237 mL via ORAL

## 2019-04-11 MED ORDER — LIDOCAINE HCL (PF) 1 % IJ SOLN: 5.0000 mL | INTRAMUSCULAR | Status: DC | PRN

## 2019-04-11 MED ORDER — LIDOCAINE-PRILOCAINE 2.5-2.5 % EX CREA: 1.0000 "application " | TOPICAL_CREAM | CUTANEOUS | Status: DC | PRN

## 2019-04-11 MED ORDER — HEPARIN SODIUM (PORCINE) 1000 UNIT/ML DIALYSIS
1000.0000 [IU] | INTRAMUSCULAR | Status: DC | PRN
  Administered 2019-04-15: 17:00:00 2400 [IU] via INTRAVENOUS_CENTRAL
  Administered 2019-04-21: 16:00:00 1000 [IU] via INTRAVENOUS_CENTRAL
  Filled 2019-04-11 (×4): qty 1

## 2019-04-11 MED ORDER — SODIUM CHLORIDE 0.9 % IV SOLN: 100.0000 mL | INTRAVENOUS | Status: DC | PRN

## 2019-04-11 MED ORDER — ALTEPLASE 2 MG IJ SOLR: 2.0000 mg | Freq: Once | INTRAMUSCULAR | Status: DC | PRN

## 2019-04-11 MED ORDER — PENTAFLUOROPROP-TETRAFLUOROETH EX AERO: 1.0000 "application " | INHALATION_SPRAY | CUTANEOUS | Status: DC | PRN

## 2019-04-11 MED ORDER — RENA-VITE PO TABS
1.0000 | ORAL_TABLET | Freq: Every day | ORAL | Status: DC
  Administered 2019-04-11 – 2019-04-22 (×12): 1 via ORAL
  Filled 2019-04-11 (×12): qty 1

## 2019-04-11 MED ORDER — HYDRALAZINE HCL 20 MG/ML IJ SOLN
20.0000 mg | Freq: Four times a day (QID) | INTRAMUSCULAR | Status: DC
  Administered 2019-04-11 – 2019-04-15 (×14): 20 mg via INTRAVENOUS
  Filled 2019-04-11 (×14): qty 1

## 2019-04-11 MED ORDER — HEPARIN SODIUM (PORCINE) 1000 UNIT/ML DIALYSIS: 1000.0000 [IU] | INTRAMUSCULAR | Status: DC | PRN

## 2019-04-11 MED ORDER — HEPARIN SODIUM (PORCINE) 1000 UNIT/ML DIALYSIS
20.0000 [IU]/kg | INTRAMUSCULAR | Status: DC | PRN
  Filled 2019-04-11 (×2): qty 2

## 2019-04-11 MED ORDER — METOPROLOL TARTRATE 25 MG PO TABS
25.0000 mg | ORAL_TABLET | Freq: Three times a day (TID) | ORAL | Status: DC
  Administered 2019-04-11 – 2019-04-14 (×8): 25 mg via ORAL
  Filled 2019-04-11 (×8): qty 1

## 2019-04-11 NOTE — Progress Notes (Signed)
Paged Atkins MD about continued hypertension despite prn medications. See new orders.

## 2019-04-11 NOTE — Progress Notes (Signed)
Per Atkins MD, BP goal is a MAP less than 100.

## 2019-04-11 NOTE — Evaluation (Signed)
Physical Therapy Evaluation Patient Details Name: Monique Henderson MRN: [id number] DOB: 1951-04-17 Today's Date: 04/11/2019   History of Present Illness  68 y.o. female with PMH of CKD, HTN, and breast CA (in remission) presents with several hour h/o chest pain, which she had never experienced previously, found to have Type A Aortic dissection. Pt underwent Thoracic Ascending Aneurysm repair on 11/3.  Clinical Impression  Pt presents to PT with deficits in functional mobility, gait, balance, endurance, strength, power, safety awareness, adherence to precautions. Pt requires PT physical assistance for all bed mobility and OOB activity, limited by fatigue at this time after recent completion of hemodialysis. Pt requires frequent PT cueing to maintain sternal precautions. Pt will benefit from continued acute PT services to restore her PLOF.    Follow Up Recommendations Home health PT;Supervision/Assistance - 24 hour    Equipment Recommendations  None recommended by PT    Recommendations for Other Services OT consult     Precautions / Restrictions Precautions Precautions: Fall;Sternal Precaution Comments: Pt verbally educated on sternal precautions, needs reinforcement Restrictions Weight Bearing Restrictions: No Other Position/Activity Restrictions: sternal precautions      Mobility  Bed Mobility Overal bed mobility: Needs Assistance Bed Mobility: Supine to Sit     Supine to sit: Min assist     General bed mobility comments: minA to scoot at edge of bed, complying with sternal precautions  Transfers Overall transfer level: Needs assistance Equipment used: 1 person hand held assist Transfers: Sit to/from Stand Sit to Stand: Min assist            Ambulation/Gait Ambulation/Gait assistance: Min assist Gait Distance (Feet): 2 Feet Assistive device: 1 person hand held assist Gait Pattern/deviations: Shuffle Gait velocity: reduced Gait velocity interpretation: <1.31  ft/sec, indicative of household ambulator General Gait Details: short shuffling steps from bed to recliner  Stairs            Wheelchair Mobility    Modified Rankin (Stroke Patients Only)       Balance                                             Pertinent Vitals/Pain Pain Assessment: No/denies pain    Home Living Family/patient expects to be discharged to:: Private residence Living Arrangements: Alone Available Help at Discharge: Friend(s);Available PRN/intermittently Type of Home: Apartment Home Access: Stairs to enter Entrance Stairs-Rails: Can reach both Entrance Stairs-Number of Steps: flight Home Layout: One level Home Equipment: Walker - 2 wheels;Cane - single point      Prior Function Level of Independence: Independent               Hand Dominance        Extremity/Trunk Assessment   Upper Extremity Assessment Upper Extremity Assessment: Generalized weakness(limited 2/2 sternal precautions)    Lower Extremity Assessment Lower Extremity Assessment: Overall WFL for tasks assessed    Cervical / Trunk Assessment Cervical / Trunk Assessment: Normal  Communication   Communication: No difficulties  Cognition Arousal/Alertness: Awake/alert Behavior During Therapy: WFL for tasks assessed/performed Overall Cognitive Status: No family/caregiver present to determine baseline cognitive functioning                                 General Comments: pt groggy, recently finishing dialysis, responds to all PT questions appropriately  General Comments General comments (skin integrity, edema, etc.): VSS during session, pt hypertensive, reporting dizziness upon sitting but BP stable    Exercises     Assessment/Plan    PT Assessment Patient needs continued PT services  PT Problem List Decreased strength;Decreased activity tolerance;Decreased balance;Decreased mobility;Decreased cognition;Decreased knowledge of use of  DME;Decreased safety awareness;Decreased knowledge of precautions;Cardiopulmonary status limiting activity       PT Treatment Interventions DME instruction;Gait training;Stair training;Functional mobility training;Therapeutic activities;Therapeutic exercise;Balance training;Neuromuscular re-education;Patient/family education    PT Goals (Current goals can be found in the Care Plan section)  Acute Rehab PT Goals Patient Stated Goal: To improve mobility PT Goal Formulation: With patient Time For Goal Achievement: 04/25/19 Potential to Achieve Goals: Good    Frequency Min 3X/week   Barriers to discharge        Co-evaluation               AM-PAC PT "6 Clicks" Mobility  Outcome Measure Help needed turning from your back to your side while in a flat bed without using bedrails?: A Little Help needed moving from lying on your back to sitting on the side of a flat bed without using bedrails?: A Little Help needed moving to and from a bed to a chair (including a wheelchair)?: A Little Help needed standing up from a chair using your arms (e.g., wheelchair or bedside chair)?: A Little Help needed to walk in hospital room?: A Little Help needed climbing 3-5 steps with a railing? : A Lot 6 Click Score: 17    End of Session Equipment Utilized During Treatment: Gait belt Activity Tolerance: Patient limited by fatigue Patient left: in chair;with call bell/phone within reach;with chair alarm set Nurse Communication: Mobility status PT Visit Diagnosis: Other abnormalities of gait and mobility (R26.89)    Time: 3748-2707 PT Time Calculation (min) (ACUTE ONLY): 17 min   Charges:   PT Evaluation $PT Eval Low Complexity: [place], PT, DPT Acute Rehabilitation Pager: [phone]   Zenaida Niece 04/11/2019, 11:51 AM

## 2019-04-11 NOTE — Procedures (Signed)
I was present at this dialysis session. I have reviewed the session itself and made appropriate changes.   Goal UF 2.5L. Using Temp L [place] HD Cath. 4K bath.  Tol Tx well. RS  Filed Weights   04/09/19 0435 04/10/19 0500 04/11/19 0700  Weight: 62.5 kg 63.9 kg 60 kg    Recent Labs  Lab 04/11/19 0802  NA 138  K 3.9  CL 101  CO2 26  GLUCOSE 147*  BUN 57*  CREATININE 4.92*  CALCIUM 8.4*  PHOS 4.6    Recent Labs  Lab 04/08/19 0459 04/09/19 0852 04/10/19 0327 04/11/19 0802  WBC 14.2* 9.7 8.8 11.8*  NEUTROABS 12.0* 7.8* 6.6  --   HGB 10.1* 9.2* 9.2* 10.0*  HCT 30.6* 28.0* 29.0* 31.2*  MCV 90.0 90.6 93.5 93.1  PLT 142* 137* 147* 174    Scheduled Meds: . sodium chloride   Intravenous Once  . aspirin  324 mg Oral Daily  . bupivacaine liposome  20 mL Infiltration Once  . Chlorhexidine Gluconate Cloth  6 each Topical Daily  . Chlorhexidine Gluconate Cloth  6 each Topical Q0600  . cloNIDine  0.3 mg Transdermal Weekly  . docusate sodium  100 mg Oral Daily  . insulin aspart  0-9 Units Subcutaneous TID WC  . mouth rinse  15 mL Mouth Rinse BID  . metoprolol tartrate  12.5 mg Oral BID   Or  . metoprolol tartrate  12.5 mg Per Tube BID  . pantoprazole  40 mg Oral Daily  . senna  1 tablet Oral QHS  . sodium chloride flush  10-40 mL Intracatheter Q12H  . sodium chloride flush  3 mL Intravenous Once  . sodium chloride flush  3 mL Intravenous Q12H   Continuous Infusions: . sodium chloride Stopped (04/09/19 0854)  . sodium chloride    . sodium chloride    . dexmedetomidine (PRECEDEX) IV infusion    . DOPamine    . labetalol (NORMODYNE) infusion 0.5 mg/min (04/11/19 0704)  . nitroGLYCERIN Stopped (04/08/19 1235)  . nitroPRUSSide Stopped (04/08/19 1201)   PRN Meds:.sodium chloride, sodium chloride, alteplase, haloperidol lactate, heparin, heparin, hydrALAZINE, lidocaine (PF), lidocaine-prilocaine, metoprolol tartrate, morphine injection, ondansetron (ZOFRAN) IV, oxyCODONE,  pentafluoroprop-tetrafluoroeth, sodium chloride flush, sodium chloride flush, traMADol   Pearson Grippe  MD 04/11/2019, 9:14 AM

## 2019-04-11 NOTE — Progress Notes (Signed)
Initial Nutrition Assessment  DOCUMENTATION CODES:   Not applicable  INTERVENTION:    Nepro Shake po BID, each supplement provides 425 kcal and 19 grams protein  Renal MVI daily   NUTRITION DIAGNOSIS:   Increased nutrient needs related to post-op healing as evidenced by estimated needs.  GOAL:   Patient will meet greater than or equal to 90% of their needs  MONITOR:   PO intake, Supplement acceptance, Weight trends, Labs, I & O's  REASON FOR ASSESSMENT:   LOS    ASSESSMENT:   Patient with PMH significant for CKD IV, HTN, Hepatitis C, and breast cancer in remission. Presents this admission with Type A Aortic dissection.   11/3- s/p repair type 1 dissection 11/4-extubated  11/5- start HD  RD working remotely.  Unable to reach pt by phone. Diet advanced from [place] to renal on 11/8. Meal completions charted as 0-85% for her last eight meals. RD to provide supplementation to maximize kcal and protein this admission.   Admission weight: 58.9 kg  Current weight: 57 kg   I/O: +1,582 ml since admit UOP: 300 ml x 24 hrs  Last HD today: 2500 ml net UF  Medications: colace, SS novolog, senokot Labs: Cr 4.92-increased CBG 113-142   Diet Order:   Diet Order            Diet renal with fluid restriction Fluid restriction: 1200 mL Fluid; Room service appropriate? Yes; Fluid consistency: Thin  Diet effective now              EDUCATION NEEDS:   Not appropriate for education at this time  Skin:  Skin Assessment: Skin Integrity Issues: Skin Integrity Issues:: Incisions Incisions: chest  Last BM:  11/2  Height:   Ht Readings from Last 1 Encounters:  04/04/19 5\' 3"  (1.6 m)    Weight:   Wt Readings from Last 1 Encounters:  04/11/19 57 kg    Ideal Body Weight:  52.3 kg  BMI:  Body mass index is 22.26 kg/m.  Estimated Nutritional Needs:   Kcal:  1700-1900 kcal  Protein:  85-100 grams  Fluid:  1000 ml + UOP   Mariana Single RD, LDN Clinical  Nutrition Pager # [phone]

## 2019-04-12 ENCOUNTER — Inpatient Hospital Stay (HOSPITAL_COMMUNITY): Payer: Medicare Other

## 2019-04-12 LAB — GLUCOSE, CAPILLARY
Glucose-Capillary: 127 mg/dL — ABNORMAL HIGH (ref 70–99)
Glucose-Capillary: 105 mg/dL — ABNORMAL HIGH (ref 70–99)
Glucose-Capillary: 122 mg/dL — ABNORMAL HIGH (ref 70–99)
Glucose-Capillary: 115 mg/dL — ABNORMAL HIGH (ref 70–99)

## 2019-04-12 LAB — CBC
HCT: 31.5 % — ABNORMAL LOW (ref 36.0–46.0)
Hemoglobin: 10.2 g/dL — ABNORMAL LOW (ref 12.0–15.0)
MCH: 30.2 pg (ref 26.0–34.0)
MCHC: 32.4 g/dL (ref 30.0–36.0)
MCV: 93.2 fL (ref 80.0–100.0)
Platelets: 204 10*3/uL (ref 150–400)
RBC: 3.38 MIL/uL — ABNORMAL LOW (ref 3.87–5.11)
RDW: 16.1 % — ABNORMAL HIGH (ref 11.5–15.5)
WBC: 13 10*3/uL — ABNORMAL HIGH (ref 4.0–10.5)
nRBC: 0 % (ref 0.0–0.2)

## 2019-04-12 LAB — BASIC METABOLIC PANEL
Anion gap: 11 (ref 5–15)
BUN: 33 mg/dL — ABNORMAL HIGH (ref 8–23)
CO2: 25 mmol/L (ref 22–32)
Calcium: 8.2 mg/dL — ABNORMAL LOW (ref 8.9–10.3)
Chloride: 103 mmol/L (ref 98–111)
Creatinine, Ser: 3.48 mg/dL — ABNORMAL HIGH (ref 0.44–1.00)
GFR calc Af Amer: 15 mL/min — ABNORMAL LOW (ref >60)
GFR calc non Af Amer: 13 mL/min — ABNORMAL LOW (ref >60)
Glucose, Bld: 137 mg/dL — ABNORMAL HIGH (ref 70–99)
Potassium: 3.6 mmol/L (ref 3.5–5.1)
Sodium: 139 mmol/L (ref 135–145)

## 2019-04-12 MED ORDER — CEFAZOLIN SODIUM-DEXTROSE 2-4 GM/100ML-% IV SOLN
INTRAVENOUS | Status: AC
  Filled 2019-04-12: qty 100

## 2019-04-12 MED ORDER — CEFAZOLIN SODIUM-DEXTROSE 2-4 GM/100ML-% IV SOLN
2.0000 g | INTRAVENOUS | Status: AC
  Filled 2019-04-12: qty 100

## 2019-04-12 MED ORDER — SENNOSIDES-DOCUSATE SODIUM 8.6-50 MG PO TABS
2.0000 | ORAL_TABLET | Freq: Two times a day (BID) | ORAL | Status: DC
  Administered 2019-04-12 – 2019-04-23 (×16): 2 via ORAL
  Filled 2019-04-12 (×21): qty 2

## 2019-04-12 MED ORDER — CEFAZOLIN SODIUM-DEXTROSE 2-4 GM/100ML-% IV SOLN
2.0000 g | Freq: Once | INTRAVENOUS | Status: DC
  Filled 2019-04-12: qty 100

## 2019-04-12 NOTE — Progress Notes (Signed)
8 Days Post-Op Procedure(s) (LRB): THORACIC ASCENDING ANEURYSM REPAIR (AAA)  USING 28 MM X 30 CM HEMASHIELD PLATINUM VASCULAR GRAFT (N/A) Transesophageal Echocardiogram (Tee) (N/A) Subjective: No complaints  Objective: Vital signs in last 24 hours: Temp:  [96.1 F (35.6 C)-99.7 F (37.6 C)] 99.7 F (37.6 C) (11/11 0327) Pulse Rate:  [66-110] 80 (11/11 0605) Cardiac Rhythm: Normal sinus rhythm (11/10 2000) Resp:  [16-29] 21 (11/11 0605) BP: (93-202)/(44-110) 165/79 (11/11 0605) SpO2:  [96 %-100 %] 100 % (11/11 0605) Weight:  [57 kg-58.6 kg] 58.6 kg (11/11 0600)  Hemodynamic parameters for last 24 hours:    Intake/Output from previous day: 11/10 0701 - 11/11 0700 In: 240 [P.O.:240] Out: 2900 [Urine:400] Intake/Output this shift: No intake/output data recorded.  General appearance: alert and no distress Neurologic: intact Heart: regular rate and rhythm, S1, S2 normal, no murmur, click, rub or gallop Lungs: clear to auscultation bilaterally Abdomen: soft, non-tender; bowel sounds normal; no masses,  no organomegaly Extremities: edema 1+  Lab Results: Recent Labs    04/11/19 0802 04/12/19 0328  WBC 11.8* 13.0*  HGB 10.0* 10.2*  HCT 31.2* 31.5*  PLT 174 204   BMET:  Recent Labs    04/11/19 0802 04/12/19 0328  NA 138 139  K 3.9 3.6  CL 101 103  CO2 26 25  GLUCOSE 147* 137*  BUN 57* 33*  CREATININE 4.92* 3.48*  CALCIUM 8.4* 8.2*    PT/INR: No results for input(s): LABPROT, INR in the last 72 hours. ABG    Component Value Date/Time   PHART 7.407 04/07/2019 0833   HCO3 24.5 04/07/2019 0833   TCO2 26 04/07/2019 0833   ACIDBASEDEF 8.0 (H) 04/06/2019 1748   O2SAT 96.0 04/07/2019 0833   CBG (last 3)  Recent Labs    04/11/19 1621 04/11/19 2110 04/12/19 0646  GLUCAP 162* 112* 127*    Assessment/Plan: S/P Procedure(s) (LRB): THORACIC ASCENDING ANEURYSM REPAIR (AAA)  USING 28 MM X 30 CM HEMASHIELD PLATINUM VASCULAR GRAFT (N/A) Transesophageal  Echocardiogram (Tee) (N/A) Plan for transfer to step-down: see transfer orders appreciate nephrology assistance  bp control Laxatives for chronic constipation   LOS: 9 days    Wonda Olds 04/12/2019

## 2019-04-12 NOTE — Evaluation (Signed)
Occupational Therapy Evaluation Patient Details Name: Monique Henderson MRN: [id number] DOB: 04/04/1951 Today's Date: 04/12/2019    History of Present Illness 68 y.o. female with PMH of CKD, HTN, and breast CA (in remission) presents with several hour h/o chest pain, which she had never experienced previously, found to have Type A Aortic dissection. Pt underwent Thoracic Ascending Aneurysm repair on 11/3.   Clinical Impression   PTA patient reports independent.  Admitted for above and limited by problem list below, including impaired cognition, pain, decreased activity tolerance and generalized weakness. Patient currently requires min assist for LB ADLs, transfers and toileting, min assist for bed mobility.  Poor recall and adherence to precautions during session, requires max cueing and redirection.  Completed short blessed test, pt scoring 17/28 (impairment consistent to dementia) with poor attention, sequencing and memory noted; although internally distracted by stomach pain therefore recommend further assessment. Recommend continued OT services while admitted and after dc at SNF level at this time due to cognition and reports of not having 24/7 support.  Will follow acutely.        Follow Up Recommendations  Supervision/Assistance - 24 hour;SNF(if cognition clears or pt has 24/7 may progress to [facility])    Equipment Recommendations  3 in 1 bedside commode    Recommendations for Other Services       Precautions / Restrictions Precautions Precautions: Fall;Sternal Precaution Comments: Pt verbally educated on sternal precautions, needs reinforcement Restrictions Weight Bearing Restrictions: Yes Other Position/Activity Restrictions: sternal precautions      Mobility Bed Mobility Overal bed mobility: Needs Assistance Bed Mobility: Supine to Sit;Sit to Supine     Supine to sit: Min assist Sit to supine: Min guard   General bed mobility comments: min assist to transiton trunk to EOB  with max cueing for sternal precautions and poor adherance; returns to supine with min guard for safety and then proceeds to roll to her stomach requiring +2 to reposition and manage lines for safety (RN present in room)    Transfers Overall transfer level: Needs assistance   Transfers: Sit to/from Stand;Stand Pivot Transfers Sit to Stand: Min assist Stand pivot transfers: Min assist       General transfer comment: min assist for safety, balance and line mgmt; max cueing for sternal precautions with poor adherence     Balance Overall balance assessment: Mild deficits observed, not formally tested                                         ADL either performed or assessed with clinical judgement   ADL Overall ADL's : Needs assistance/impaired     Grooming: Minimal assistance;Sitting   Upper Body Bathing: Minimal assistance;Sitting   Lower Body Bathing: Sit to/from stand;Minimal assistance   Upper Body Dressing : Minimal assistance;Sitting   Lower Body Dressing: Minimal assistance;Sit to/from stand   Toilet Transfer: Minimal Production assistant, radio Details (indicate cue type and reason): to [place]- Clothing Manipulation and Hygiene: Min guard;Sit to/from stand;Sitting/lateral lean Toileting - Clothing Manipulation Details (indicate cue type and reason): cueing for hygiene after urinating      Functional mobility during ADLs: Minimal assistance;Cueing for safety;Cueing for sequencing General ADL Comments: pt limited by cognition and pain      Vision   Vision Assessment?: No apparent visual deficits     Perception     Praxis  Pertinent Vitals/Pain Pain Assessment: Faces Faces Pain Scale: Hurts even more Pain Location: stomach  Pain Descriptors / Indicators: Discomfort;Grimacing;Guarding Pain Intervention(s): Limited activity within patient's tolerance;Monitored during session;Repositioned;Patient requesting pain meds-RN  notified     Hand Dominance Right   Extremity/Trunk Assessment Upper Extremity Assessment Upper Extremity Assessment: Generalized weakness(within sternal precautions )   Lower Extremity Assessment Lower Extremity Assessment: Defer to PT evaluation   Cervical / Trunk Assessment Cervical / Trunk Assessment: Normal   Communication Communication Communication: No difficulties   Cognition Arousal/Alertness: Awake/alert Behavior During Therapy: Impulsive;Restless Overall Cognitive Status: Impaired/Different from baseline Area of Impairment: Orientation;Attention;Memory;Following commands;Safety/judgement;Awareness;Problem solving                 Orientation Level: Disoriented to;Situation Current Attention Level: Focused Memory: Decreased recall of precautions;Decreased short-term memory Following Commands: Follows one step commands inconsistently;Follows one step commands with increased time Safety/Judgement: Decreased awareness of safety;Decreased awareness of deficits Awareness: Intellectual Problem Solving: Slow processing;Decreased initiation;Difficulty sequencing;Requires verbal cues;Requires tactile cues General Comments: maximal cueing for sternal precautions, safety and awareness; poor processing, attention and memory; short blessed test completed with pt scoring 17/28 (impairment consistent to dementia) but noted internally distracted by stomach pain with further assessment recommended    General Comments  VSS     Exercises     Shoulder Instructions      Home Living Family/patient expects to be discharged to:: Private residence Living Arrangements: Alone Available Help at Discharge: Friend(s);Available PRN/intermittently Type of Home: Apartment Home Access: Stairs to enter Entrance Stairs-Number of Steps: flight Entrance Stairs-Rails: Can reach both Home Layout: One level     Bathroom Shower/Tub: Teacher, early years/pre: Standard     Home  Equipment: Environmental consultant - 2 wheels;Cane - single point          Prior Functioning/Environment Level of Independence: Independent                 OT Problem List: Decreased strength;Decreased activity tolerance;Impaired balance (sitting and/or standing);Decreased cognition;Decreased safety awareness;Decreased knowledge of use of DME or AE;Decreased knowledge of precautions;Pain      OT Treatment/Interventions: Self-care/ADL training;Energy conservation;DME and/or AE instruction;Therapeutic activities;Cognitive remediation/compensation;Patient/family education    OT Goals(Current goals can be found in the care plan section) Acute Rehab OT Goals Patient Stated Goal: less pain OT Goal Formulation: With patient Time For Goal Achievement: 04/26/19 Potential to Achieve Goals: Good  OT Frequency: Min 2X/week   Barriers to D/C: Decreased caregiver support          Co-evaluation              AM-PAC OT "6 Clicks" Daily Activity     Outcome Measure Help from another person eating meals?: A Little Help from another person taking care of personal grooming?: A Little Help from another person toileting, which includes using toliet, bedpan, or urinal?: A Little Help from another person bathing (including washing, rinsing, drying)?: A Little Help from another person to put on and taking off regular upper body clothing?: A Little Help from another person to put on and taking off regular lower body clothing?: A Little 6 Click Score: 18   End of Session Equipment Utilized During Treatment: Oxygen(1L) Nurse Communication: Mobility status;Precautions  Activity Tolerance: Patient limited by pain(stomach pain) Patient left: in bed;with call bell/phone within reach;with bed alarm set;with nursing/sitter in room  OT Visit Diagnosis: Other abnormalities of gait and mobility (R26.89);Muscle weakness (generalized) (M62.81);Other symptoms and signs involving cognitive function;Pain Pain - part of  body: (  stomach)                Time: 2300-9794 OT Time Calculation (min): 26 min Charges:  OT General Charges $OT Visit: 1 Visit OT Evaluation $OT Eval Moderate Complexity: 1 Mod OT Treatments $Self Care/Home Management : 8-22 mins  Delight Stare, OT Acute Rehabilitation Services Pager [phone] Office [phone]    Delight Stare 04/12/2019, 10:39 AM

## 2019-04-12 NOTE — Plan of Care (Signed)

## 2019-04-12 NOTE — Progress Notes (Signed)
[facility] NEPHROLOGY PROGRESS NOTE  Assessment/ Plan: Pt is a 68 y.o. 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  #Dialysis dependent AKI on CKD stage IV, oliguric due to ischemic ATN in the setting of hypotension: Baseline creatinine level around 2.5-3.  DStarted HD on 11/5 via Temp L [place] HD Cath.  Cont on THS Schedule, next 11/120: 3K, 3 hours, 2 L UF, no heparin. Renal US was neg for acute process.  Will req TDC from IR now moving otu of ICU.  #Hypertension: Variable,  Cont UF.   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  Per CT surgery.  #Anemia, acute blood loss: Monitor hemoglobin.  Transfuse as needed.   Subjective:   No interval events, complaining of some abdominal pain  Tolerated HD yesterday  0.4L UOP, 2.5L UF   Objective Vital signs in last 24 hours: Vitals:   04/12/19 1000 04/12/19 1100 04/12/19 1123 04/12/19 1137  BP: (!) 153/75 (!) 200/98  (!) 207/112  Pulse:  82    Resp: (!) 23 20    Temp:   98.5 F (36.9 C)   TempSrc:   Oral   SpO2:  97%    Weight:      Height:       Weight change: -3 kg  Intake/Output Summary (Last 24 hours) at 04/12/2019 1219 Last data filed at 04/12/2019 0800 Gross per 24 hour  Intake 240 ml  Output 230 ml  Net 10 ml       Labs: Basic Metabolic Panel: Recent Labs  Lab 04/06/19 1450  04/07/19 0554  04/10/19 0327 04/11/19 0802 04/12/19 0328  NA 141   < > 138   < > 143 138 139  K 4.6   < > 3.7   < > 4.1 3.9 3.6  CL 103  --  101   < > 94* 101 103  CO2 23  --  24   < > 25 26 25   GLUCOSE 166*  --  235*   < > 118* 147* 137*  BUN 53*  --  44*   < > 46* 57* 33*  CREATININE 5.31*  --  4.46*   < > 4.90* 4.92* 3.48*  CALCIUM 7.5*  --  7.4*   < > 9.1 8.4*  8.2*  PHOS 9.9*  --  5.9*  --   --  4.6  --    < > = values in this interval not displayed.   Liver Function Tests: Recent Labs  Lab 04/07/19 0554 04/09/19 0852 04/11/19 0802  AST  --  143*  --   ALT  --  251*  --   ALKPHOS  --  71  --   BILITOT  --  1.1  --   PROT  --  5.0*  --   ALBUMIN 2.9* 2.7* 2.7*   No results for input(s): LIPASE, AMYLASE in the last 168 hours. No results for input(s): AMMONIA in the last 168 hours. CBC: Recent Labs  Lab 04/08/19 0459 04/09/19 0852 04/10/19 0327 04/11/19 0802 04/12/19 0328  WBC 14.2* 9.7 8.8 11.8* 13.0*  NEUTROABS 12.0* 7.8* 6.6  --   --   HGB 10.1* 9.2* 9.2* 10.0* 10.2*  HCT 30.6* 28.0* 29.0* 31.2* 31.5*  MCV 90.0 90.6 93.5 93.1  93.2  PLT 142* 137* 147* 174 204   Cardiac Enzymes: No results for input(s): CKTOTAL, CKMB, CKMBINDEX, TROPONINI in the last 168 hours. CBG: Recent Labs  Lab 04/11/19 1115 04/11/19 1621 04/11/19 2110 04/12/19 0646 04/12/19 1125  GLUCAP 132* 162* 112* 127* 105*    Iron Studies: No results for input(s): IRON, TIBC, TRANSFERRIN, FERRITIN in the last 72 hours. Studies/Results: No results found.  Medications: Infusions: . sodium chloride Stopped (04/09/19 0854)  . sodium chloride    . sodium chloride    . dexmedetomidine (PRECEDEX) IV infusion    . nitroGLYCERIN Stopped (04/08/19 1235)  . nitroPRUSSide Stopped (04/08/19 1201)    Scheduled Medications: . sodium chloride   Intravenous Once  . aspirin  324 mg Oral Daily  . bupivacaine liposome  20 mL Infiltration Once  . Chlorhexidine Gluconate Cloth  6 each Topical Daily  . Chlorhexidine Gluconate Cloth  6 each Topical Q0600  . cloNIDine  0.3 mg Transdermal Weekly  . docusate sodium  100 mg Oral Daily  . feeding supplement (NEPRO CARB STEADY)  237 mL Oral BID BM  . hydrALAZINE  20 mg Intravenous Q6H  . insulin aspart  0-9 Units Subcutaneous TID WC  . mouth rinse  15 mL Mouth Rinse BID  . metoprolol tartrate  25 mg Oral TID  .  multivitamin  1 tablet Oral QHS  . pantoprazole  40 mg Oral Daily  . senna  1 tablet Oral QHS  . sodium chloride flush  10-40 mL Intracatheter Q12H  . sodium chloride flush  3 mL Intravenous Once  . sodium chloride flush  3 mL Intravenous Q12H    have reviewed scheduled and prn medications.  Physical Exam: No significant change General: On BiPAP, not in distress  heart:RRR, s1s2 nl, no rub Lungs: Coarse breath sound bilateral, no wheezing Abdomen:soft, Non-tender, non-distended Extremities:No LE edema Dialysis Access: Left subclavian IJ catheter was placed on 11/5.  Site clean. Neurology: Alert awake and following commands.  Ryan B Sanford 04/12/2019,12:19 PM  LOS: 9 days

## 2019-04-12 NOTE — Progress Notes (Signed)
PT Cancellation Note  Patient Details Name: Monique Henderson MRN: [id number] DOB: Sep 06, 1950   Cancelled Treatment:    Reason Eval/Treat Not Completed: Patient declined, no reason specified. Pt refuses PT treatment x2 on this date, refusing 2/2 abdominal pain. Pt reports she needs to have a bowel movement but is unable to. PT attempts to educate the patient on the need for continued mobility to lead to motility, however patient continues to refuse. PT will attempt to follow up per PT POC.   Zenaida Niece 04/12/2019, 3:23 PM

## 2019-04-12 NOTE — Progress Notes (Signed)
IR consulted by Dr. Joelyn Oms for possible image-guided tunneled HD catheter placement.  WBCs today trending up to 13.0 today. Patient was brought down for procedure today. She is A&Ox3, but has non-specific complaints (generalized abdominal pain but denies N/V). Discussed case with Dr. Pascal Lux and Dr. Joelyn Oms who recommended holding procedure at this time. Discussed above with patient who conveys understanding.   IR will continue to follow this week.   Bea Graff Dinia Joynt, PA-C 04/12/2019, 3:25 PM

## 2019-04-13 ENCOUNTER — Inpatient Hospital Stay (HOSPITAL_COMMUNITY): Payer: Medicare Other

## 2019-04-13 LAB — RENAL FUNCTION PANEL
Albumin: 2.9 g/dL — ABNORMAL LOW (ref 3.5–5.0)
Anion gap: 14 (ref 5–15)
BUN: 46 mg/dL — ABNORMAL HIGH (ref 8–23)
CO2: 25 mmol/L (ref 22–32)
Calcium: 8.9 mg/dL (ref 8.9–10.3)
Chloride: 100 mmol/L (ref 98–111)
Creatinine, Ser: 3.98 mg/dL — ABNORMAL HIGH (ref 0.44–1.00)
GFR calc Af Amer: 13 mL/min — ABNORMAL LOW (ref >60)
GFR calc non Af Amer: 11 mL/min — ABNORMAL LOW (ref >60)
Glucose, Bld: 113 mg/dL — ABNORMAL HIGH (ref 70–99)
Phosphorus: 4.2 mg/dL (ref 2.5–4.6)
Potassium: 3.5 mmol/L (ref 3.5–5.1)
Sodium: 139 mmol/L (ref 135–145)

## 2019-04-13 LAB — CBC
HCT: 30.8 % — ABNORMAL LOW (ref 36.0–46.0)
Hemoglobin: 9.8 g/dL — ABNORMAL LOW (ref 12.0–15.0)
MCH: 29.6 pg (ref 26.0–34.0)
MCHC: 31.8 g/dL (ref 30.0–36.0)
MCV: 93.1 fL (ref 80.0–100.0)
Platelets: 277 10*3/uL (ref 150–400)
RBC: 3.31 MIL/uL — ABNORMAL LOW (ref 3.87–5.11)
RDW: 15.9 % — ABNORMAL HIGH (ref 11.5–15.5)
WBC: 14.2 10*3/uL — ABNORMAL HIGH (ref 4.0–10.5)
nRBC: 0 % (ref 0.0–0.2)

## 2019-04-13 LAB — GLUCOSE, CAPILLARY
Glucose-Capillary: 107 mg/dL — ABNORMAL HIGH (ref 70–99)
Glucose-Capillary: 94 mg/dL (ref 70–99)
Glucose-Capillary: 99 mg/dL (ref 70–99)
Glucose-Capillary: 105 mg/dL — ABNORMAL HIGH (ref 70–99)

## 2019-04-13 MED ORDER — HEPARIN SODIUM (PORCINE) 1000 UNIT/ML IJ SOLN
INTRAMUSCULAR | Status: AC
  Filled 2019-04-13: qty 4

## 2019-04-13 MED ORDER — SODIUM CHLORIDE 0.9 % IV SOLN: 100.0000 mL | INTRAVENOUS | Status: DC | PRN

## 2019-04-13 MED ORDER — AMLODIPINE BESYLATE 10 MG PO TABS
10.0000 mg | ORAL_TABLET | Freq: Every day | ORAL | Status: DC
  Administered 2019-04-13 – 2019-04-23 (×10): 10 mg via ORAL
  Filled 2019-04-13 (×10): qty 1

## 2019-04-13 NOTE — Progress Notes (Signed)
PT Cancellation Note  Patient Details Name: Monique Henderson MRN: [id number] DOB: 08-Oct-1950   Cancelled Treatment:    Reason Eval/Treat Not Completed: Patient declined, no reason specified patient asleep in bed, easily awakened but very fatigued. BP 194/91 at rest. Patient declines modified session of light exercise in bed. Will follow and attempt on next date of service.    Windell Norfolk, DPT, CBIS  Supplemental Physical Therapist [facility]    Pager [phone] Acute Rehab Office [phone]

## 2019-04-13 NOTE — Progress Notes (Signed)
Pt received from dialysis. VSS. Call bell in reach. Will continue to monitor.  Arletta Bale, RN

## 2019-04-13 NOTE — Progress Notes (Addendum)
      [phone]       [place] [address]             [phone]        9 Days Post-Op Procedure(s) (LRB): THORACIC ASCENDING ANEURYSM REPAIR (AAA)  USING 28 MM X 30 CM HEMASHIELD PLATINUM VASCULAR GRAFT (N/A) Transesophageal Echocardiogram (Tee) (N/A)  Subjective: On HD this am. She was agitated earlier but nurse in HD was able to calm her down. She states her belly hurts but denies nausea or vomiting.  Objective: Vital signs in last 24 hours: Temp:  [98.3 F (36.8 C)-98.9 F (37.2 C)] 98.8 F (37.1 C) (11/12 0258) Pulse Rate:  [79-94] 83 (11/12 0734) Cardiac Rhythm: Normal sinus rhythm (11/12 0400) Resp:  [10-29] 22 (11/12 0734) BP: (151-217)/(70-122) 192/111 (11/12 0734) SpO2:  [92 %-100 %] 96 % (11/12 0723) Weight:  [58 kg-61 kg] 58 kg (11/12 0723)  Pre op weight 58.9 kg Current Weight  04/13/19 58 kg      Intake/Output from previous day: 11/11 0701 - 11/12 0700 In: 300 [P.O.:300] Out: 530 [Urine:530]   Physical Exam:  Cardiovascular: RRR Pulmonary: Slightly diminished at bases. Abdomen: Soft, non tender, bowel sounds present. Extremities: Trace bilateral lower extremity edema. Wounds: Clean and dry.  No erythema or signs of infection.  Lab Results: CBC: Recent Labs    04/11/19 0802 04/12/19 0328  WBC 11.8* 13.0*  HGB 10.0* 10.2*  HCT 31.2* 31.5*  PLT 174 204   BMET:  Recent Labs    04/11/19 0802 04/12/19 0328  NA 138 139  K 3.9 3.6  CL 101 103  CO2 26 25  GLUCOSE 147* 137*  BUN 57* 33*  CREATININE 4.92* 3.48*  CALCIUM 8.4* 8.2*    PT/INR:  Lab Results  Component Value Date   INR 1.3 (H) 04/04/2019   INR 1.0 04/03/2019   INR 1.0 12/09/2016   ABG:  INR: Will add last result for INR, ABG once components are confirmed Will add last 4 CBG results once components are confirmed  Assessment/Plan:  1. CV - Hypertensive earlier this am and is in SR. On Clonidine patch, Hydralazine 20 mg IV QID. Will restart  Amlodipine for better BP control. 2.  Pulmonary - On room air this am. CXR this am shows stable cardiomegaly, bibasilar atelectasis and small pleural effusions. Encourage incentive spirometer. 3. Pre op HGA1C 6. She likely has pre diabetes and will need further surveillance by medical doctor after discharge. 4.  Acute blood loss anemia - H and H yesterday 9.8 and 30.8 5. AKI on CKD (stage IV)-Tunneled HD cath not placed yesterday (WBC up to 13,000, abdominal pain) 6. Leukocytosis-WBC this am up to 14,200. She does not appear to have pneumonia on CXR. Will check urine. ? From HD catheter? 7. GI-abdominal pain. Has a history of chronic constipation  Abanoub Hanken M ZimmermanPA-C 04/13/2019,7:56 AM

## 2019-04-13 NOTE — Procedures (Signed)
I was present at this dialysis session. I have reviewed the session itself and made appropriate changes.  \ Some agitation with HD, calm now.  TDC Positional Qb.  AF, WBC 14.2.  HOld on TDC until improved.    3L UF, 4K bath.  BP stable  Filed Weights   04/12/19 0600 04/13/19 0300 04/13/19 0723  Weight: 58.6 kg 61 kg 58 kg    Recent Labs  Lab 04/13/19 0755  NA 139  K 3.5  CL 100  CO2 25  GLUCOSE 113*  BUN 46*  CREATININE 3.98*  CALCIUM 8.9  PHOS 4.2    Recent Labs  Lab 04/08/19 0459 04/09/19 0852 04/10/19 0327 04/11/19 0802 04/12/19 0328 04/13/19 0755  WBC 14.2* 9.7 8.8 11.8* 13.0* 14.2*  NEUTROABS 12.0* 7.8* 6.6  --   --   --   HGB 10.1* 9.2* 9.2* 10.0* 10.2* 9.8*  HCT 30.6* 28.0* 29.0* 31.2* 31.5* 30.8*  MCV 90.0 90.6 93.5 93.1 93.2 93.1  PLT 142* 137* 147* 174 204 277    Scheduled Meds: . amLODipine  10 mg Oral Daily  . aspirin  324 mg Oral Daily  . Chlorhexidine Gluconate Cloth  6 each Topical Daily  . Chlorhexidine Gluconate Cloth  6 each Topical Q0600  . cloNIDine  0.3 mg Transdermal Weekly  . docusate sodium  100 mg Oral Daily  . feeding supplement (NEPRO CARB STEADY)  237 mL Oral BID BM  . hydrALAZINE  20 mg Intravenous Q6H  . insulin aspart  0-9 Units Subcutaneous TID WC  . mouth rinse  15 mL Mouth Rinse BID  . metoprolol tartrate  25 mg Oral TID  . multivitamin  1 tablet Oral QHS  . pantoprazole  40 mg Oral Daily  . senna-docusate  2 tablet Oral BID  . sodium chloride flush  3 mL Intravenous Once   Continuous Infusions: . sodium chloride Stopped (04/09/19 0854)  . sodium chloride    . sodium chloride    . sodium chloride    .  ceFAZolin (ANCEF) IV    .  ceFAZolin (ANCEF) IV     PRN Meds:.sodium chloride, sodium chloride, sodium chloride, alteplase, heparin, heparin, lidocaine (PF), lidocaine-prilocaine, metoprolol tartrate, ondansetron (ZOFRAN) IV, oxyCODONE, pentafluoroprop-tetrafluoroeth, sodium chloride flush, traMADol   Pearson Grippe   MD 04/13/2019, 9:36 AM

## 2019-04-14 ENCOUNTER — Inpatient Hospital Stay (HOSPITAL_COMMUNITY): Payer: Medicare Other

## 2019-04-14 DIAGNOSIS — N184 Chronic kidney disease, stage 4 (severe): Secondary | ICD-10-CM

## 2019-04-14 DIAGNOSIS — N186 End stage renal disease: Secondary | ICD-10-CM

## 2019-04-14 DIAGNOSIS — I714 Abdominal aortic aneurysm, without rupture: Secondary | ICD-10-CM

## 2019-04-14 LAB — GLUCOSE, CAPILLARY
Glucose-Capillary: 128 mg/dL — ABNORMAL HIGH (ref 70–99)
Glucose-Capillary: 137 mg/dL — ABNORMAL HIGH (ref 70–99)
Glucose-Capillary: 114 mg/dL — ABNORMAL HIGH (ref 70–99)
Glucose-Capillary: 116 mg/dL — ABNORMAL HIGH (ref 70–99)

## 2019-04-14 LAB — CBC
HCT: 30.7 % — ABNORMAL LOW (ref 36.0–46.0)
Hemoglobin: 9.9 g/dL — ABNORMAL LOW (ref 12.0–15.0)
MCH: 29.7 pg (ref 26.0–34.0)
MCHC: 32.2 g/dL (ref 30.0–36.0)
MCV: 92.2 fL (ref 80.0–100.0)
Platelets: 314 10*3/uL (ref 150–400)
RBC: 3.33 MIL/uL — ABNORMAL LOW (ref 3.87–5.11)
RDW: 15.9 % — ABNORMAL HIGH (ref 11.5–15.5)
WBC: 14.5 10*3/uL — ABNORMAL HIGH (ref 4.0–10.5)
nRBC: 0 % (ref 0.0–0.2)

## 2019-04-14 MED ORDER — PIPERACILLIN-TAZOBACTAM IN DEX 2-0.25 GM/50ML IV SOLN
2.2500 g | Freq: Three times a day (TID) | INTRAVENOUS | Status: DC
  Administered 2019-04-14 – 2019-04-20 (×17): 2.25 g via INTRAVENOUS
  Filled 2019-04-14 (×20): qty 50

## 2019-04-14 MED ORDER — FLEET ENEMA 7-19 GM/118ML RE ENEM
1.0000 | ENEMA | Freq: Once | RECTAL | Status: AC
  Administered 2019-04-14: 1 via RECTAL
  Filled 2019-04-14: qty 1

## 2019-04-14 MED ORDER — METOPROLOL TARTRATE 50 MG PO TABS
50.0000 mg | ORAL_TABLET | Freq: Three times a day (TID) | ORAL | Status: DC
  Administered 2019-04-14 – 2019-04-18 (×12): 50 mg via ORAL
  Filled 2019-04-14 (×12): qty 1

## 2019-04-14 MED ORDER — MAGNESIUM CITRATE PO SOLN
0.5000 | Freq: Once | ORAL | Status: AC
  Administered 2019-04-14: 0.5 via ORAL
  Filled 2019-04-14: qty 296

## 2019-04-14 MED ORDER — BISACODYL 10 MG RE SUPP
10.0000 mg | Freq: Once | RECTAL | Status: AC
  Administered 2019-04-14: 17:00:00 10 mg via RECTAL
  Filled 2019-04-14: qty 1

## 2019-04-14 NOTE — Progress Notes (Addendum)
Occupational Therapy Treatment Patient Details Name: Monique Henderson MRN: [id number] DOB: 01/09/1951 Today's Date: 04/14/2019    History of present illness 68 y.o. female with PMH of CKD, HTN, and breast CA (in remission) presents with several hour h/o chest pain, which she had never experienced previously, found to have Type A Aortic dissection. Pt underwent Thoracic Ascending Aneurysm repair on 11/3.   OT comments  Pt. Seen for skilled OT treatment.  Reluctant to participate, providing no reason why just that she did not want to.  Max encouragement and finally agreeable to oob.  Bed mobility with stand pivot to bsc min a.  Amb. To recliner min guard a with max cues for sternal precautions when transitioning into sitting. Did do well maintaining precautions with use of rw for short distance ambulation.  Reviewed precautions at length and provided handouts.    Follow Up Recommendations  Supervision/Assistance - 24 hour;SNF    Equipment Recommendations  3 in 1 bedside commode    Recommendations for Other Services      Precautions / Restrictions Precautions Precautions: Fall;Sternal Precaution Comments: Pt verbally educated on sternal precautions, needs reinforcement-handout also provided Restrictions Other Position/Activity Restrictions: sternal precautions Pt. Able to recall hand placement for sit/stand and stand/sit.  Also lifting restrictions       Mobility Bed Mobility Overal bed mobility: Needs Assistance Bed Mobility: Supine to Sit     Supine to sit: Min guard        Transfers Overall transfer level: Needs assistance Equipment used: 1 person hand held assist;Rolling walker (2 wheeled) Transfers: Sit to/from [facility] Sit to Stand: Min assist Stand pivot transfers: Min assist       General transfer comment: min assist for safety, balance and line mgmt; max cueing for sternal precautions with poor adherence     Balance                                            ADL either performed or assessed with clinical judgement   ADL Overall ADL's : Needs assistance/impaired                         Toilet Transfer: Minimal Production assistant, radio Details (indicate cue type and reason): to [facility] -max cues for participation and for compliance wiht sternal precautions   Toileting - Clothing Manipulation Details (indicate cue type and reason): pt. did not use the b.room, but started spraying the no rinse spray on her peri area and also around the room. educated that it was not room spray she said "i know you spray it on a washcloth"     Functional mobility during ADLs: Minimal assistance;Cueing for safety;Cueing for sequencing General ADL Comments: pt limited by cognition wanting to participate     Vision       Perception     Praxis      Cognition Arousal/Alertness: Awake/alert Behavior During Therapy: Impulsive;Restless Overall Cognitive Status: Impaired/Different from baseline Area of Impairment: Orientation;Attention;Memory;Following commands;Safety/judgement;Awareness;Problem solving                 Orientation Level: Disoriented to;Situation   Memory: Decreased recall of precautions;Decreased short-term memory Following Commands: Follows one step commands inconsistently;Follows one step commands with increased time Safety/Judgement: Decreased awareness of safety;Decreased awareness of deficits   Problem Solving: Slow processing;Decreased initiation;Difficulty sequencing;Requires verbal cues;Requires tactile cues  General Comments: pouting and would speak in a childs voice.  i explained she had to show what she can do.  she would reluctantly participate with max encouragement and negotiations        Exercises     Shoulder Instructions       General Comments      Pertinent Vitals/ Pain       Pain Assessment: No/denies pain  Home Living                                           Prior Functioning/Environment              Frequency  Min 2X/week        Progress Toward Goals  OT Goals(current goals can now be found in the care plan section)  Progress towards OT goals: Progressing toward goals     Plan      Co-evaluation                 AM-PAC OT "6 Clicks" Daily Activity     Outcome Measure   Help from another person eating meals?: A Little Help from another person taking care of personal grooming?: A Little Help from another person toileting, which includes using toliet, bedpan, or urinal?: A Little Help from another person bathing (including washing, rinsing, drying)?: A Little Help from another person to put on and taking off regular upper body clothing?: A Little Help from another person to put on and taking off regular lower body clothing?: A Little 6 Click Score: 18    End of Session Equipment Utilized During Treatment: Rolling walker  OT Visit Diagnosis: Other abnormalities of gait and mobility (R26.89);Muscle weakness (generalized) (M62.81);Other symptoms and signs involving cognitive function;Pain   Activity Tolerance     Patient Left in chair;with call bell/phone within reach   Nurse Communication          Time: 4268-3419 OT Time Calculation (min): 24 min  Charges: OT General Charges $OT Visit: 1 Visit OT Treatments $Self Care/Home Management : 23-37 mins   Janice Coffin, COTA/L 04/14/2019, 8:45 AM

## 2019-04-14 NOTE — Consult Note (Addendum)
Hospital Consult    Reason for Consult:  Permanent dialysis access Requesting Physician:  Dr. Joelyn Oms MRN #:  [id number]  History of Present Illness: This is a 68 y.o. female who is 10 days status post surgical repair of type I aortic dissection and has past medical history significant for CKD progressing towards end-stage renal disease requiring hemodialysis.  She is being seen in consultation for evaluation for permanent dialysis access.  She is right arm dominant and would prefer placement in left arm.  During my exam today patient is surprised to hear that she will need ongoing dialysis and will require AV fistula creation versus AV graft placement.  Vein mapping pending.  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 Autumn Messing; S/P re-excision of superior and inferior margins 10/27/2012.    GERD (gastroesophageal reflux disease)    Hepatitis C    treated and RNA confirmed not detectable 01/2017   Hot flashes    Hx of radiation therapy 2005   left breast   Hx of radiation therapy 01/11/13- 03/22/13   right breast 5760 cGy 30 sessions   Hypertension    Low back pain    Lumbar spinal stenosis    S/P lumbar decompressive laminectomy, fusion and plating for lumbar spinal stensosis   Normocytic anemia    With thrombocytosis   Osteoarthritis    Personal history of radiation therapy    Right ureteral stone 2002   Shortness of breath    from pain   Uterine fibroid    Wears  dentures    top    Past Surgical History:  Procedure Laterality Date   ANTERIOR LAT LUMBAR FUSION N/A 01/18/2014   Procedure: ANTERIOR LATERAL LUMBAR FUSION LUMBAR TWO-THREE;  Surgeon: Eustace Moore, MD;  Location: [place] [facility];  Service: Neurosurgery;  Laterality: N/A;   ANTERIOR LUMBAR FUSION  01/18/2014   BACK SURGERY     BREAST LUMPECTOMY Left 01/2003   BREAST LUMPECTOMY Right 2014   BREAST LUMPECTOMY WITH NEEDLE LOCALIZATION AND AXILLARY SENTINEL LYMPH NODE BX Right 10/13/2012   Procedure: BREAST LUMPECTOMY WITH NEEDLE LOCALIZATION;  Surgeon: [name], MD;  Location: [facility];  Service: General;  Laterality: Right;  Right breast wire localized lumpectomy   LAMINECTOMY  05/27/2009   Lumbar decompressive laminectomy, fusion and plating for lumbar spinal stensosis   LUMBAR LAMINECTOMY/DECOMPRESSION MICRODISCECTOMY Left 03/23/2013   Procedure: LUMBAR LAMINECTOMY/DECOMPRESSION MICRODISCECTOMY 1 LEVEL;  Surgeon: Eustace Moore, MD;  Location: [facility];  Service: Neurosurgery;  Laterality: Left;  LUMBAR LAMINECTOMY/DECOMPRESSION MICRODISCECTOMY 1 LEVEL   MASTECTOMY, PARTIAL Left 02/26/2003   ; S/P re-excision of cranial and lateral margins 04/19/2003.    RE-EXCISION OF BREAST CANCER,SUPERIOR MARGINS Right 10/27/2012   Procedure: RE-EXCISION OF BREAST CANCER,SUPERIOR and inferior MARGINS;  Surgeon: [name], MD;  Location: [facility];  Service: General;  Laterality: Right;   RE-EXCISION OF BREAST LUMPECTOMY Left 04/2003   TEE WITHOUT CARDIOVERSION N/A 04/04/2019   Procedure:  Transesophageal Echocardiogram (Tee);  Surgeon: Wonda Olds, MD;  Location: [facility];  Service: Open Heart Surgery;  Laterality: N/A;   THORACIC AORTIC ANEURYSM REPAIR N/A 04/04/2019   Procedure: THORACIC ASCENDING ANEURYSM REPAIR (AAA)  USING 28 MM X 30 CM HEMASHIELD PLATINUM VASCULAR GRAFT;  Surgeon: Wonda Olds, MD;  Location: [facility];  Service: Open Heart Surgery;  Laterality:  N/A;    Allergies  Allergen Reactions   Shrimp [Shellfish Allergy] Shortness Of Breath   Bactroban [Mupirocin] Other (See Comments)    "Sores in nose"   Vicodin [Hydrocodone-Acetaminophen] Itching and Nausea And Vomiting    This is patient's home medication   Lisinopril Cough   Tylenol [Acetaminophen] Itching    Prior to Admission medications   Medication Sig Start Date End Date Taking? Authorizing Provider  albuterol (VENTOLIN HFA) 108 (90 Base) MCG/ACT inhaler Inhale 2 puffs into the lungs every 6 (six) hours as needed for wheezing or shortness of breath. 02/02/19  Yes [name], MD  amLODipine (NORVASC) 10 MG tablet TAKE 1 TABLET (10 MG TOTAL) BY MOUTH DAILY. 09/20/18  Yes [name], MD  carvedilol (COREG) 12.5 MG tablet TAKE 1 TABLET (12.5 MG TOTAL) BY MOUTH 2 (TWO) TIMES DAILY WITH A MEAL. 03/22/18  Yes [name], MD  cloNIDine (CATAPRES) 0.2 MG tablet Take 1 tablet (0.2 mg total) by mouth 2 (two) times daily. 05/04/18  Yes [name], MD  famotidine (PEPCID) 10 MG tablet Take 1 tablet (10 mg total) by mouth at bedtime. 05/04/18  Yes [name], MD  pregabalin (LYRICA) 75 MG capsule TAKE 1 CAPSULE (75 MG TOTAL) BY MOUTH 2 (TWO) TIMES DAILY. 09/15/18  Yes Forrest Moron, MD  oxyCODONE-acetaminophen (PERCOCET) 5-325 MG tablet Take 1 tablet by mouth every 6 (six) hours as needed for severe pain. Patient not taking: Reported on 04/03/2019 09/29/17   Edrick Kins, DPM    Social History   Socioeconomic History   Marital status: Single    Spouse name: Not on file   Number of children: Not on file   Years of education: Not on file   Highest education level: Not on file  Occupational History   Not on file  Social Needs   Financial resource strain: Not on file   Food insecurity    Worry: Not on file    Inability: Not on file   Transportation needs    Medical: Not on file    Non-medical: Not on file  Tobacco Use   Smoking status: Current Some Day  Smoker    Packs/day: 0.25    Years: 44.00    Pack years: 11.00    Types: Cigarettes   Smokeless tobacco: Never Used   Tobacco comment: smoking less  Substance and Sexual Activity   Alcohol use: Yes    Alcohol/week: 2.0 standard drinks    Types: 2 Cans of beer per week    Comment:  "couple beers q weekend"   Drug use: Yes    Types: Cocaine    Comment: "stopped back in the 1980's"   Sexual activity: Yes    Birth control/protection: Post-menopausal  Lifestyle   Physical activity    Days per week: Not on file    Minutes per session: Not on file   Stress: Not on file  Relationships   Social connections    Talks on phone: Not on file    Gets together: Not on file    Attends religious service:  Not on file    Active member of club or organization: Not on file    Attends meetings of clubs or organizations: Not on file    Relationship status: Not on file   Intimate partner violence    Fear of current or ex partner: Not on file    Emotionally abused: Not on file    Physically abused: Not on file    Forced sexual activity: Not on file  Other Topics Concern   Not on file  Social History Narrative   Not on file     Family History  Problem Relation Age of Onset   Colon cancer Mother 61   Hypertension Mother    Diabetes Sister 66   Hypertension Sister    Diabetes Brother    Hypertension Brother    Diabetes Brother    Hypertension Brother    Kidney disease Son        On dialysis   Hypertension Son    Diabetes Son    Multiple sclerosis Son    Bone cancer Sister 34   Breast cancer Neg Hx    Cervical cancer Neg Hx     ROS: Otherwise negative unless mentioned in HPI  Physical Examination  Vitals:   04/14/19 1023 04/14/19 1139  BP: (!) 183/101 (!) 165/97  Pulse: 89 78  Resp: 20 18  Temp: 98.7 F (37.1 C) 98.6 F (37 C)  SpO2: 94% 95%   Body mass index is 21.4 kg/m.  General:  WDWN in NAD Gait: Not observed HENT: WNL,  normocephalic Pulmonary: normal non-labored breathing, without Rales, rhonchi,  wheezing Cardiac: regular Skin: without rashes Vascular Exam/Pulses: Symmetrical radial pulses; palpable left AT pulse; palpable right PT pulse Extremities: without ischemic changes, without Gangrene , without cellulitis; without open wounds;  Musculoskeletal: no muscle wasting or atrophy  Neurologic: A&O X 3;  No focal weakness or paresthesias are detected; speech is fluent/normal Psychiatric:  The pt has Normal affect. Lymph:  Unremarkable  CBC    Component Value Date/Time   WBC 14.5 (H) 04/14/2019 0419   RBC 3.33 (L) 04/14/2019 0419   HGB 9.9 (L) 04/14/2019 0419   HGB 11.7 02/24/2019 1244   HGB 13.1 09/28/2012 0831   HCT 30.7 (L) 04/14/2019 0419   HCT 36.1 02/24/2019 1244   HCT 40.1 09/28/2012 0831   PLT 314 04/14/2019 0419   PLT 365 02/24/2019 1244   MCV 92.2 04/14/2019 0419   MCV 88 02/24/2019 1244   MCV 90.7 09/28/2012 0831   MCH 29.7 04/14/2019 0419   MCHC 32.2 04/14/2019 0419   RDW 15.9 (H) 04/14/2019 0419   RDW 13.9 02/24/2019 1244   RDW 14.2 09/28/2012 0831   LYMPHSABS 0.9 04/10/2019 0327   LYMPHSABS 1.7 05/04/2018 0922   LYMPHSABS 1.8 09/28/2012 0831   MONOABS 1.0 04/10/2019 0327   MONOABS 0.5 09/28/2012 0831   EOSABS 0.3 04/10/2019 0327   EOSABS 0.3 05/04/2018 0922   BASOSABS 0.0 04/10/2019 0327   BASOSABS 0.0 05/04/2018 0922   BASOSABS 0.0 09/28/2012 0831    BMET    Component Value Date/Time   NA 139 04/13/2019 0755   NA 143 02/24/2019 1244   NA 142 09/28/2012 0831   K 3.5 04/13/2019 0755   K 4.2 09/28/2012 0831   CL 100 04/13/2019 0755   CL 103 09/28/2012 0831   CO2 25 04/13/2019 0755   CO2 30 (H) 09/28/2012 0831   GLUCOSE 113 (H) 04/13/2019 0755   GLUCOSE 103 (H) 09/28/2012 0831  BUN 46 (H) 04/13/2019 0755   BUN 31 (H) 02/24/2019 1244   BUN 18.3 09/28/2012 0831   CREATININE 3.98 (H) 04/13/2019 0755   CREATININE 2.09 (H) 01/18/2017 1145   CREATININE 1.2 (H)  09/28/2012 0831   CALCIUM 8.9 04/13/2019 0755   CALCIUM 9.4 09/28/2012 0831   GFRNONAA 11 (L) 04/13/2019 0755   GFRNONAA 23 (L) 12/09/2016 1458   GFRAA 13 (L) 04/13/2019 0755   GFRAA 26 (L) 12/09/2016 1458    COAGS: Lab Results  Component Value Date   INR 1.3 (H) 04/04/2019   INR 1.0 04/03/2019   INR 1.0 12/09/2016     Non-Invasive Vascular Imaging:   Vein mapping pending   ASSESSMENT/PLAN: This is a 68 y.o. female with CKD progressing towards ESRD requiring HD  -Right arm dominant -Bilateral upper extremity vein mapping pending -Patient is surprised during visit today to hear that she will require surgery for permanent access for hemodialysis; will defer to nephrology for further discussion with the patient -Vascular will follow up with vein mapping and trend leukocytosis and will be available for left arm AV fistula versus graft plus or minus TDC placement when patient is more agreeable to dialysis access surgery   Dagoberto Ligas PA-C [facility] [phone]  I have seen and evaluated the patient. I agree with the PA note as documented above.  68 year old female admitted with ascending aortic dissection requiring open repair.  Postoperatively she has had acute on stage IV chronic kidney disease.  Currently getting dialysis via temporary catheter in left subclavian.  Vascular surgery been consulted for fistula placement.  Patient is right-handed.  Vein mapping is pending.  Patient seems very surprised when we discussed fistula access with her bedside and states she was under the impression she would not require fistula for permanent access.  Recommended that she continuing discussing with nephrology.  We will follow.  Will need TDC as well in review of notes when WBC improves.  She has palpable radial and brachial pulses bilaterally.  Marty Heck, MD [facility] [place] Office: [phone] Pager: [phone]

## 2019-04-14 NOTE — Progress Notes (Signed)
Bilateral upper extremity vein mapping has been completed. Preliminary results can be found in CV Proc through chart review.   04/14/19 3:15 PM Monique Henderson RVT

## 2019-04-14 NOTE — Progress Notes (Signed)
Physical Therapy Treatment Patient Details Name: Monique Henderson MRN: [id number] DOB: 09/02/1950 Today's Date: 04/14/2019    History of Present Illness 68 y.o. female with PMH of CKD, HTN, and breast CA (in remission) presents with several hour h/o chest pain, which she had never experienced previously, found to have Type A Aortic dissection. Pt underwent Thoracic Ascending Aneurysm repair on 11/3.    PT Comments    Patient seen for mobility progression. Pt is making progress toward PT goals and tolerated gait distance of 300 ft with RW. Pt unsteady at times and supervision/min guard needed for safety. Pt requires max encouragement to participate. Current plan remains appropriate.   Follow Up Recommendations  Home health PT;Supervision/Assistance - 24 hour     Equipment Recommendations  None recommended by PT    Recommendations for Other Services       Precautions / Restrictions Precautions Precautions: Fall;Sternal Precaution Comments: reveiwed precautions; pt is able to verbalize understanding; cues needed during functional mobility  Restrictions Other Position/Activity Restrictions: sternal precautions    Mobility  Bed Mobility Overal bed mobility: Needs Assistance;Modified Independent Bed Mobility: Supine to Sit;Sit to Supine     Supine to sit: Modified independent (Device/Increase time)     General bed mobility comments: pt able to sit up EOB and get back into bed without use of bilat UE  Transfers Overall transfer level: Needs assistance Equipment used: Rolling walker (2 wheeled) Transfers: Sit to/from [facility] Sit to Stand: Supervision         General transfer comment: supervision for safety; cues for hand placement  Ambulation/Gait Ambulation/Gait assistance: Min guard Gait Distance (Feet): 300 Feet Assistive device: Rolling walker (2 wheeled) Gait Pattern/deviations: Step-through pattern;Decreased stride length;Drifts  right/left Gait velocity: reduced   General Gait Details: pt veering to R side often and at times nearly running into objects in hallway; cues for safe use of AD; min guard for safety   Stairs         General stair comments: pt reports having chair lift for stairs    Wheelchair Mobility    Modified Rankin (Stroke Patients Only)       Balance Overall balance assessment: Mild deficits observed, not formally tested                                          Cognition Arousal/Alertness: Awake/alert Behavior During Therapy: Impulsive;Restless Overall Cognitive Status: No family/caregiver present to determine baseline cognitive functioning Area of Impairment: Memory;Following commands;Safety/judgement;Problem solving                     Memory: Decreased recall of precautions;Decreased short-term memory Following Commands: Follows one step commands inconsistently;Follows one step commands with increased time Safety/Judgement: Decreased awareness of safety;Decreased awareness of deficits   Problem Solving: Slow processing;Decreased initiation;Difficulty sequencing;Requires verbal cues;Requires tactile cues General Comments: pouting and would speak in a childs voice at times during session; pt requires max encouragment to participate; while sitting EOB pt asks "why can't we do this tomorrow?" in a whining voice then suddenly said "okay. I'll walk      Exercises      General Comments General comments (skin integrity, edema, etc.): VSS on RA      Pertinent Vitals/Pain Pain Assessment: No/denies pain    Home Living  Prior Function            PT Goals (current goals can now be found in the care plan section) Progress towards PT goals: Progressing toward goals    Frequency    Min 3X/week      PT Plan Current plan remains appropriate    Co-evaluation              AM-PAC PT "6 Clicks" Mobility    Outcome Measure  Help needed turning from your back to your side while in a flat bed without using bedrails?: A Little Help needed moving from lying on your back to sitting on the side of a flat bed without using bedrails?: A Little Help needed moving to and from a bed to a chair (including a wheelchair)?: A Little Help needed standing up from a chair using your arms (e.g., wheelchair or bedside chair)?: A Little Help needed to walk in hospital room?: A Little Help needed climbing 3-5 steps with a railing? : A Little 6 Click Score: 18    End of Session Equipment Utilized During Treatment: Gait belt Activity Tolerance: Patient tolerated treatment well Patient left: with call bell/phone within reach;in bed;with bed alarm set Nurse Communication: Mobility status PT Visit Diagnosis: Other abnormalities of gait and mobility (R26.89)     Time: 5929-2446 PT Time Calculation (min) (ACUTE ONLY): 24 min  Charges:  $Gait Training: 23-37 mins                     Earney Navy, PTA Acute Rehabilitation Services Pager: [phone] Office: [phone]     Darliss Cheney 04/14/2019, 3:55 PM

## 2019-04-14 NOTE — Progress Notes (Signed)
Monique Henderson NEPHROLOGY PROGRESS NOTE  Assessment/ Plan: Pt is a 68 y.o. 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  #AoCKD4, right now dialyzing as an AKI, but very likely will not recover sufficient GFR to come off dialysis.  We will begin clip process as AKI but also ask [facility] to see patient for AVF.  Previously planned for [facility] however has mild persistent leukocytosis, waiting for this to resolve.  Remains afebrile  Cont on THS Schedule, next 11/14: 4K, 3 hours, 2 L UF, no heparin.   #Hypertension: Variable,  Cont UF.   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  Per CT surgery.  #Anemia, acute blood loss: Monitor hemoglobin.  Transfuse as needed.   Subjective:   No new issues  HD yesterday 2.8L  No sig UOP   Objective Vital signs in last 24 hours: Vitals:   04/14/19 0402 04/14/19 0550 04/14/19 1023 04/14/19 1139  BP:   (!) 183/101 (!) 165/97  Pulse:   89   Resp:  (!) 22 20   Temp:   98.7 F (37.1 C)   TempSrc:   Oral   SpO2:   94%   Weight: 54.8 kg     Height:       Weight change: -3 kg  Intake/Output Summary (Last 24 hours) at 04/14/2019 1246 Last data filed at 04/14/2019 1025 Gross per 24 hour  Intake 170 ml  Output -  Net 170 ml       Labs: Basic Metabolic Panel: Recent Labs  Lab 04/11/19 0802 04/12/19 0328 04/13/19 0755  NA 138 139 139  K 3.9 3.6 3.5  CL 101 103 100  CO2 26 25 25   GLUCOSE 147* 137* 113*  BUN 57* 33* 46*  CREATININE 4.92* 3.48* 3.98*  CALCIUM 8.4* 8.2* 8.9  PHOS 4.6  --  4.2   Liver Function Tests: Recent Labs  Lab 04/09/19 0852 04/11/19 0802 04/13/19 0755  AST 143*  --   --   ALT 251*  --   --   ALKPHOS 71  --   --   BILITOT 1.1  --   --   PROT 5.0*   --   --   ALBUMIN 2.7* 2.7* 2.9*   No results for input(s): LIPASE, AMYLASE in the last 168 hours. No results for input(s): AMMONIA in the last 168 hours. CBC: Recent Labs  Lab 04/08/19 0459 04/09/19 0852 04/10/19 0327 04/11/19 0802 04/12/19 0328 04/13/19 0755 04/14/19 0419  WBC 14.2* 9.7 8.8 11.8* 13.0* 14.2* 14.5*  NEUTROABS 12.0* 7.8* 6.6  --   --   --   --   HGB 10.1* 9.2* 9.2* 10.0* 10.2* 9.8* 9.9*  HCT 30.6* 28.0* 29.0* 31.2* 31.5* 30.8* 30.7*  MCV 90.0 90.6 93.5 93.1 93.2 93.1 92.2  PLT 142* 137* 147* 174 204 277 314   Cardiac Enzymes: No results for input(s): CKTOTAL, CKMB, CKMBINDEX, TROPONINI in the last 168 hours. CBG: Recent Labs  Lab 04/13/19 1119 04/13/19 1601 04/13/19 2101 04/14/19 0624 04/14/19 1120  GLUCAP 94 99 105* 128* 137*    Iron Studies: No results for input(s): IRON, TIBC, TRANSFERRIN, FERRITIN in the last 72 hours. Studies/Results: Dg Chest 2 View  Result Date: 04/13/2019 CLINICAL DATA:  History of open heart surgery. EXAM: CHEST -  2 VIEW COMPARISON:  04/10/2019. FINDINGS: Interval removal of right IJ sheath. Left subclavian dual lumen catheter noted in stable position. Prior median sternotomy. Cardiomegaly. No pulmonary venous congestion. Bibasilar and infiltrates/edema. Small bilateral pleural effusions. No pneumothorax. IMPRESSION: 1. Interim removal of right IJ sheath. Left subclavian catheter in stable position. 2. Prior median sternotomy. Stable cardiomegaly. No pulmonary venous congestion. 3. Bibasilar atelectasis. Bibasilar infiltrates and/or edema with small bilateral pleural effusions. Electronically Signed   By: Marcello Moores  Register   On: 04/13/2019 07:56    Medications: Infusions: . sodium chloride Stopped (04/09/19 0854)  . piperacillin-tazobactam (ZOSYN)  IV 2.25 g (04/14/19 1150)    Scheduled Medications: . amLODipine  10 mg Oral Daily  . aspirin  324 mg Oral Daily  . Chlorhexidine Gluconate Cloth  6 each Topical Daily  .  Chlorhexidine Gluconate Cloth  6 each Topical Q0600  . cloNIDine  0.3 mg Transdermal Weekly  . docusate sodium  100 mg Oral Daily  . feeding supplement (NEPRO CARB STEADY)  237 mL Oral BID BM  . hydrALAZINE  20 mg Intravenous Q6H  . insulin aspart  0-9 Units Subcutaneous TID WC  . mouth rinse  15 mL Mouth Rinse BID  . metoprolol tartrate  50 mg Oral TID  . multivitamin  1 tablet Oral QHS  . pantoprazole  40 mg Oral Daily  . senna-docusate  2 tablet Oral BID  . sodium chloride flush  3 mL Intravenous Once    have reviewed scheduled and prn medications.  Physical Exam: No significant change General: On BiPAP, not in distress  heart:RRR, s1s2 nl, no rub Lungs: Coarse breath sound bilateral, no wheezing Abdomen:soft, Non-tender, non-distended Extremities:No LE edema Dialysis Access: Left subclavian IJ catheter was placed on 11/5.  Site clean. Neurology: Alert awake and following commands.  Monique Henderson 04/14/2019,12:46 PM  LOS: 11 days

## 2019-04-14 NOTE — Care Management Important Message (Signed)
Important Message  Patient Details  Name: Monique Henderson MRN: [id number] Date of Birth: March 20, 1951   Medicare Important Message Given:  Yes     [name] 04/14/2019, 2:50 PM

## 2019-04-14 NOTE — Progress Notes (Signed)
10 Days Post-Op Procedure(s) (LRB): THORACIC ASCENDING ANEURYSM REPAIR (AAA)  USING 28 MM X 30 CM HEMASHIELD PLATINUM VASCULAR GRAFT (N/A) Transesophageal Echocardiogram (Tee) (N/A) Subjective: Calm and cooperative, sitting up in the bedside chair.  Concerned that she has had no BM yet.   Objective: Vital signs in last 24 hours: Temp:  [97.9 F (36.6 C)-99.8 F (37.7 C)] 99.8 F (37.7 C) (11/13 0356) Pulse Rate:  [25-98] 79 (11/13 0017) Cardiac Rhythm: Normal sinus rhythm (11/13 0708) Resp:  [16-25] 22 (11/13 0550) BP: (140-196)/(76-98) 140/88 (11/13 0356) SpO2:  [95 %-100 %] 95 % (11/13 0356) Weight:  [53.3 kg-54.8 kg] 54.8 kg (11/13 0402)     Intake/Output from previous day: 11/12 0701 - 11/13 0700 In: 120 [P.O.:120] Out: 2800  Intake/Output this shift: No intake/output data recorded.  General appearance: alert, cooperative and mild distress Neurologic: intact Heart: regular rate and rhythm Lungs: Breath sounds are clear, Good sats on RA.  Extremities: no edema.   Wounds- Incisions are intact and healing well.   Lab Results: Recent Labs    04/13/19 0755 04/14/19 0419  WBC 14.2* 14.5*  HGB 9.8* 9.9*  HCT 30.8* 30.7*  PLT 277 314   BMET:  Recent Labs    04/12/19 0328 04/13/19 0755  NA 139 139  K 3.6 3.5  CL 103 100  CO2 25 25  GLUCOSE 137* 113*  BUN 33* 46*  CREATININE 3.48* 3.98*  CALCIUM 8.2* 8.9    PT/INR: No results for input(s): LABPROT, INR in the last 72 hours. ABG    Component Value Date/Time   PHART 7.407 04/07/2019 0833   HCO3 24.5 04/07/2019 0833   TCO2 26 04/07/2019 0833   ACIDBASEDEF 8.0 (H) 04/06/2019 1748   O2SAT 96.0 04/07/2019 0833   CBG (last 3)  Recent Labs    04/13/19 1601 04/13/19 2101 04/14/19 0624  GLUCAP 99 105* 128*    Assessment/Plan: S/P Procedure(s) (LRB): THORACIC ASCENDING ANEURYSM REPAIR (AAA)  USING 28 MM X 30 CM HEMASHIELD PLATINUM VASCULAR GRAFT (N/A) Transesophageal Echocardiogram (Tee) (N/A)    -POD-10 repair of ascending aorta with a straight graft. Stable hemodynamically.  Making slow progress with mobility, she is reluctant to partiicipate with therapies.    -Acute on chronic stage IV kidney disease- currently dialysis dependent.  Has temporary dialysis catheter and tunneled dialysis catheter planned when leukocytosis resolved.   -Leukocytosis- WBC trending up. Wounds OK. CXR shows mild bibasilar ATX, ? left basilar infiltrate.  She is not febrile. Will add Zosyn for possible HCAP. Encourage pulmonary hygiene.   -History of hypertension- Still trending high despite multi-drug therapy.  HR acceptable for up-titration of metoprolol. Will increase to 50mg  PO BID.   -    LOS: 11 days   Antony Odea, PA-C [phone] 04/14/2019

## 2019-04-15 LAB — RENAL FUNCTION PANEL
Albumin: 2.7 g/dL — ABNORMAL LOW (ref 3.5–5.0)
Anion gap: 16 — ABNORMAL HIGH (ref 5–15)
BUN: 43 mg/dL — ABNORMAL HIGH (ref 8–23)
CO2: 24 mmol/L (ref 22–32)
Calcium: 8.8 mg/dL — ABNORMAL LOW (ref 8.9–10.3)
Chloride: 100 mmol/L (ref 98–111)
Creatinine, Ser: 3.36 mg/dL — ABNORMAL HIGH (ref 0.44–1.00)
GFR calc Af Amer: 16 mL/min — ABNORMAL LOW (ref >60)
GFR calc non Af Amer: 14 mL/min — ABNORMAL LOW (ref >60)
Glucose, Bld: 121 mg/dL — ABNORMAL HIGH (ref 70–99)
Phosphorus: 3.9 mg/dL (ref 2.5–4.6)
Potassium: 3.5 mmol/L (ref 3.5–5.1)
Sodium: 140 mmol/L (ref 135–145)

## 2019-04-15 LAB — GLUCOSE, CAPILLARY
Glucose-Capillary: 167 mg/dL — ABNORMAL HIGH (ref 70–99)
Glucose-Capillary: 106 mg/dL — ABNORMAL HIGH (ref 70–99)
Glucose-Capillary: 155 mg/dL — ABNORMAL HIGH (ref 70–99)

## 2019-04-15 LAB — CBC WITH DIFFERENTIAL/PLATELET
Abs Immature Granulocytes: 0.16 10*3/uL — ABNORMAL HIGH (ref 0.00–0.07)
Basophils Absolute: 0 10*3/uL (ref 0.0–0.1)
Basophils Relative: 0 %
Eosinophils Absolute: 0.3 10*3/uL (ref 0.0–0.5)
Eosinophils Relative: 2 %
HCT: 30.1 % — ABNORMAL LOW (ref 36.0–46.0)
Hemoglobin: 9.5 g/dL — ABNORMAL LOW (ref 12.0–15.0)
Immature Granulocytes: 1 %
Lymphocytes Relative: 6 %
Lymphs Abs: 1.1 10*3/uL (ref 0.7–4.0)
MCH: 29.5 pg (ref 26.0–34.0)
MCHC: 31.6 g/dL (ref 30.0–36.0)
MCV: 93.5 fL (ref 80.0–100.0)
Monocytes Absolute: 2.2 10*3/uL — ABNORMAL HIGH (ref 0.1–1.0)
Monocytes Relative: 11 %
Neutro Abs: 15.5 10*3/uL — ABNORMAL HIGH (ref 1.7–7.7)
Neutrophils Relative %: 80 %
Platelets: 394 10*3/uL (ref 150–400)
RBC: 3.22 MIL/uL — ABNORMAL LOW (ref 3.87–5.11)
RDW: 15.8 % — ABNORMAL HIGH (ref 11.5–15.5)
WBC: 19.3 10*3/uL — ABNORMAL HIGH (ref 4.0–10.5)
nRBC: 0 % (ref 0.0–0.2)

## 2019-04-15 MED ORDER — HEPARIN SODIUM (PORCINE) 1000 UNIT/ML IJ SOLN
INTRAMUSCULAR | Status: AC
  Administered 2019-04-15: 17:00:00 2400 [IU] via INTRAVENOUS_CENTRAL
  Filled 2019-04-15: qty 3

## 2019-04-15 MED ORDER — HYDRALAZINE HCL 25 MG PO TABS
25.0000 mg | ORAL_TABLET | Freq: Three times a day (TID) | ORAL | Status: DC
  Administered 2019-04-15 – 2019-04-23 (×19): 25 mg via ORAL
  Filled 2019-04-15 (×19): qty 1

## 2019-04-15 MED ORDER — OXYCODONE HCL 5 MG PO TABS
ORAL_TABLET | ORAL | Status: AC
  Administered 2019-04-15: 14:00:00 10 mg via ORAL
  Filled 2019-04-15: qty 2

## 2019-04-15 NOTE — Progress Notes (Signed)
[facility]  Subjective  -still seems surprised that she is going to require fistula.   Objective (!) 152/84 97 98.3 F (36.8 C) (Oral) (!) 31 91%  Intake/Output Summary (Last 24 hours) at 04/15/2019 0932 Last data filed at 04/15/2019 0300 Gross per 24 hour  Intake 100 ml  Output 300 ml  Net -200 ml    Palpable radial pulse bilaterally  Laboratory Lab Results: Recent Labs    04/13/19 0755 04/14/19 0419  WBC 14.2* 14.5*  HGB 9.8* 9.9*  HCT 30.8* 30.7*  PLT 277 314   BMET Recent Labs    04/13/19 0755  NA 139  K 3.5  CL 100  CO2 25  GLUCOSE 113*  BUN 46*  CREATININE 3.98*  CALCIUM 8.9    COAG Lab Results  Component Value Date   INR 1.3 (H) 04/04/2019   INR 1.0 04/03/2019   INR 1.0 12/09/2016   No results found for: PTT  Assessment/Planning:  68 year old female presented with aortic dissection requiring open repair with CT surgery.  Vascular surgery was consulted for AV fistula placement and TDC.  Patient still seems surprised that she is going to require AV fistula placement.  I know nephrology has talked extensively with her.  Reviewed vein mapping and likely has option for left basilic vein fistula.  Labs will be rechecked tomorrow to monitor white count.  We can make plans to proceed whenever she is comfortable and encouraged her to talk more with nephrologist today.  She still thinks her kidneys are going to recover.  Marty Heck 04/15/2019 9:32 AM --

## 2019-04-15 NOTE — Progress Notes (Addendum)
      [address]       [place] [address]             [phone]      11 Days Post-Op Procedure(s) (LRB): THORACIC ASCENDING ANEURYSM REPAIR (AAA)  USING 28 MM X 30 CM HEMASHIELD PLATINUM VASCULAR GRAFT (N/A) Transesophageal Echocardiogram (Tee) (N/A)   Subjective:  Patient denies pain, shortness of breath.  States she has to pee.  Objective: Vital signs in last 24 hours: Temp:  [98.1 F (36.7 C)-98.9 F (37.2 C)] 98.2 F (36.8 C) (11/14 0510) Pulse Rate:  [74-89] 78 (11/14 0510) Cardiac Rhythm: Ventricular tachycardia (11/14 0223) Resp:  [16-34] 26 (11/14 0510) BP: (156-183)/(86-101) 156/88 (11/14 0510) SpO2:  [94 %-100 %] 99 % (11/14 0510) Weight:  [52.5 kg] 52.5 kg (11/14 0510)  Intake/Output from previous day: 11/13 0701 - 11/14 0700 In: 100 [P.O.:50; IV Piggyback:50] Out: 300 [Urine:300]  General appearance: alert, cooperative and no distress Heart: regular rate and rhythm Lungs: clear to auscultation bilaterally Abdomen: soft, non-tender; bowel sounds normal; no masses,  no organomegaly Extremities: edema none present Wound: clean and dry  Lab Results: Recent Labs    04/13/19 0755 04/14/19 0419  WBC 14.2* 14.5*  HGB 9.8* 9.9*  HCT 30.8* 30.7*  PLT 277 314   BMET:  Recent Labs    04/13/19 0755  NA 139  K 3.5  CL 100  CO2 25  GLUCOSE 113*  BUN 46*  CREATININE 3.98*  CALCIUM 8.9    PT/INR: No results for input(s): LABPROT, INR in the last 72 hours. ABG    Component Value Date/Time   PHART 7.407 04/07/2019 0833   HCO3 24.5 04/07/2019 0833   TCO2 26 04/07/2019 0833   ACIDBASEDEF 8.0 (H) 04/06/2019 1748   O2SAT 96.0 04/07/2019 0833   CBG (last 3)  Recent Labs    04/14/19 1733 04/14/19 2141 04/15/19 0645  GLUCAP 114* 116* 167*    Assessment/Plan: S/P Procedure(s) (LRB): THORACIC ASCENDING ANEURYSM REPAIR (AAA)  USING 28 MM X 30 CM HEMASHIELD PLATINUM VASCULAR GRAFT (N/A) Transesophageal Echocardiogram (Tee) (N/A)   1. CV- NSR, remains hypertensive- on Clonidine, Lopressor, Norvasc, will increase hydralazine to 25 mg q8 2. Pulm- suspected left lower lobe pneumonia, off oxygen, continue IS 3. Renal- AKI on CKD StageIV- Nephrology feels dialysis will likely be permanent, awaiting access placement, which is on hold due to elevated WBC.Marland Kitchen vascular surgery is working patient up 4. ID- afebrile, mild leukocytosis yesterday- on Zosyn for possible pneumonia, repeat labs in AM 5. Deconditioning- continue PT/OT 6. Dispo- patient stable, hypertension persists despite titration of BB, will increase hydralazine, awaiting placement of TDC, however needs leukocytosis to be resolved.. there has been no clear source for elevated WBC, started on Zosyn for left lung infiltrate will continue, repeat labs/CXR in AM   LOS: 12 days    Ellwood Handler, PA-C  04/15/2019   Chart reviewed, patient examined, agree with above. She remains afebrile with stable WBC Ct at 14.5 yesterday. Will repeat tomorrow.

## 2019-04-15 NOTE — Plan of Care (Signed)
Continue to monitor

## 2019-04-15 NOTE — Progress Notes (Signed)
[facility] NEPHROLOGY PROGRESS NOTE  Assessment/ Plan: Pt is a 68 y.o. 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  #AoCKD4, right now dialyzing as an AKI, but very possiblewill not recover sufficient GFR to come off dialysis.  We will begin clip process as AKI but also have ask [facility] to see patient for AVF.  I have discussed uncertainty of how long on HD with pt and why I think permanent access is prudent. No TDC yet as has mild persistent leukocytosis, waiting for this to resolve.  Remains afebrile  Cont on THS Schedule, next 11/14: 4K, 3 hours, 2 L UF, no heparin.   #Hypertension: Variable,  Cont UF.   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  Per CT surgery.  #Anemia, acute blood loss: Monitor hemoglobin.  Transfuse as needed.   Subjective:   No new issues  Again discussed that she is likely to req HD for some time, if not indefinite. Recommended that she conside rAVF as measure ot lessen time catheter dependent. She is very familiar with HD having family members prev recive it.   0.3L UOP + 2 unmeasured voids   Objective Vital signs in last 24 hours: Vitals:   04/15/19 0700 04/15/19 0800 04/15/19 0816 04/15/19 0900  BP:   (!) 152/84   Pulse: 73 97  79  Resp: (!) 25 18 (!) 31 20  Temp:   98.3 F (36.8 C)   TempSrc:   Oral   SpO2: 97% 91%  100%  Weight:      Height:       Weight change: -5.5 kg  Intake/Output Summary (Last 24 hours) at 04/15/2019 1015 Last data filed at 04/15/2019 0300 Gross per 24 hour  Intake 100 ml  Output 200 ml  Net -100 ml       Labs: Basic Metabolic Panel: Recent Labs  Lab 04/11/19 0802 04/12/19 0328 04/13/19 0755  NA 138 139 139  K 3.9 3.6 3.5  CL 101 103 100   CO2 26 25 25   GLUCOSE 147* 137* 113*  BUN 57* 33* 46*  CREATININE 4.92* 3.48* 3.98*  CALCIUM 8.4* 8.2* 8.9  PHOS 4.6  --  4.2   Liver Function Tests: Recent Labs  Lab 04/09/19 0852 04/11/19 0802 04/13/19 0755  AST 143*  --   --   ALT 251*  --   --   ALKPHOS 71  --   --   BILITOT 1.1  --   --   PROT 5.0*  --   --   ALBUMIN 2.7* 2.7* 2.9*   No results for input(s): LIPASE, AMYLASE in the last 168 hours. No results for input(s): AMMONIA in the last 168 hours. CBC: Recent Labs  Lab 04/09/19 0852 04/10/19 0327 04/11/19 0802 04/12/19 0328 04/13/19 0755 04/14/19 0419  WBC 9.7 8.8 11.8* 13.0* 14.2* 14.5*  NEUTROABS 7.8* 6.6  --   --   --   --   HGB 9.2* 9.2* 10.0* 10.2* 9.8* 9.9*  HCT 28.0* 29.0* 31.2* 31.5* 30.8* 30.7*  MCV 90.6 93.5 93.1 93.2 93.1 92.2  PLT 137* 147* 174 204 277 314   Cardiac Enzymes: No results for input(s): CKTOTAL, CKMB, CKMBINDEX, TROPONINI in the last 168 hours. CBG: Recent Labs  Lab 04/14/19 0624 04/14/19 1120  04/14/19 1733 04/14/19 2141 04/15/19 0645  GLUCAP 128* 137* 114* 116* 167*    Iron Studies: No results for input(s): IRON, TIBC, TRANSFERRIN, FERRITIN in the last 72 hours. Studies/Results: Vas [place] Upper Ext Vein Mapping (pre-op Avf)  Result Date: 04/14/2019 UPPER EXTREMITY VEIN MAPPING  Indications: Pre-access. Comparison Study: No prior studies. Performing Technologist: Oliver Hum RVT  Examination Guidelines: A complete evaluation includes B-mode imaging, spectral Doppler, color Doppler, and power Doppler as needed of all accessible portions of each vessel. Bilateral testing is considered an integral part of a complete examination. Limited examinations for reoccurring indications may be performed as noted. +-----------------+-------------+----------+---------+ Right Cephalic   Diameter (cm)Depth (cm)Findings  +-----------------+-------------+----------+---------+ Shoulder             0.26        0.52              +-----------------+-------------+----------+---------+ Prox upper arm       0.26        0.40             +-----------------+-------------+----------+---------+ Mid upper arm        0.20        0.24             +-----------------+-------------+----------+---------+ Dist upper arm       0.22        0.27             +-----------------+-------------+----------+---------+ Antecubital fossa    0.17        0.36   branching +-----------------+-------------+----------+---------+ Prox forearm         0.34        0.18   Thrombus  +-----------------+-------------+----------+---------+ Mid forearm          0.26        0.21   Thrombus  +-----------------+-------------+----------+---------+ Dist forearm         0.30        0.19   Thrombus  +-----------------+-------------+----------+---------+ +-----------------+-------------+----------+--------+ Right Basilic    Diameter (cm)Depth (cm)Findings +-----------------+-------------+----------+--------+ Shoulder             0.32        0.34            +-----------------+-------------+----------+--------+ Mid upper arm        0.27        0.66            +-----------------+-------------+----------+--------+ Dist upper arm       0.24        0.54            +-----------------+-------------+----------+--------+ Antecubital fossa    0.18        0.29            +-----------------+-------------+----------+--------+ Prox forearm         0.13        0.25            +-----------------+-------------+----------+--------+ Mid forearm          0.09        0.18            +-----------------+-------------+----------+--------+ Distal forearm       0.11        0.18            +-----------------+-------------+----------+--------+ +-----------------+-------------+----------+--------+ Left Cephalic    Diameter (cm)Depth (cm)Findings +-----------------+-------------+----------+--------+ Shoulder             0.31        0.49             +-----------------+-------------+----------+--------+  Prox upper arm       0.23        0.43            +-----------------+-------------+----------+--------+ Mid upper arm        0.17        0.33            +-----------------+-------------+----------+--------+ Dist upper arm       0.20        0.30            +-----------------+-------------+----------+--------+ Antecubital fossa    0.23        0.32            +-----------------+-------------+----------+--------+ Prox forearm         0.21        0.30            +-----------------+-------------+----------+--------+ Mid forearm          0.09        0.19            +-----------------+-------------+----------+--------+ Dist forearm         0.06        0.13            +-----------------+-------------+----------+--------+ +-----------------+-------------+----------+---------+ Left Basilic     Diameter (cm)Depth (cm)Findings  +-----------------+-------------+----------+---------+ Shoulder             0.38        0.62             +-----------------+-------------+----------+---------+ Prox upper arm       0.33        0.59             +-----------------+-------------+----------+---------+ Mid upper arm        0.31        0.59             +-----------------+-------------+----------+---------+ Dist upper arm       0.22        0.49             +-----------------+-------------+----------+---------+ Antecubital fossa    0.24        0.42   branching +-----------------+-------------+----------+---------+ Prox forearm         0.18        0.26   branching +-----------------+-------------+----------+---------+ Mid forearm          0.11        0.23             +-----------------+-------------+----------+---------+ Distal forearm       0.08        0.17             +-----------------+-------------+----------+---------+ *See table(s) above for measurements and observations.  Diagnosing physician:     Preliminary     Medications: Infusions: . sodium chloride Stopped (04/09/19 0854)  . piperacillin-tazobactam (ZOSYN)  IV 2.25 g (04/15/19 0543)    Scheduled Medications: . amLODipine  10 mg Oral Daily  . aspirin  324 mg Oral Daily  . Chlorhexidine Gluconate Cloth  6 each Topical Daily  . Chlorhexidine Gluconate Cloth  6 each Topical Q0600  . cloNIDine  0.3 mg Transdermal Weekly  . docusate sodium  100 mg Oral Daily  . feeding supplement (NEPRO CARB STEADY)  237 mL Oral BID BM  . hydrALAZINE  25 mg Oral Q8H  . insulin aspart  0-9 Units Subcutaneous TID WC  . mouth rinse  15 mL Mouth Rinse BID  . metoprolol tartrate  50 mg Oral TID  .  multivitamin  1 tablet Oral QHS  . pantoprazole  40 mg Oral Daily  . senna-docusate  2 tablet Oral BID  . sodium chloride flush  3 mL Intravenous Once    have reviewed scheduled and prn medications.  Physical Exam: No significant change General: On BiPAP, not in distress  heart:RRR, s1s2 nl, no rub Lungs: Coarse breath sound bilateral, no wheezing Abdomen:soft, Non-tender, non-distended Extremities:No LE edema Dialysis Access: Left subclavian IJ catheter was placed on 11/5.  Site clean. Neurology: Alert awake and following commands.  Damere Brandenburg B Alexzavier Girardin 04/15/2019,10:15 AM  LOS: 12 days

## 2019-04-16 ENCOUNTER — Inpatient Hospital Stay (HOSPITAL_COMMUNITY): Payer: Medicare Other

## 2019-04-16 LAB — BASIC METABOLIC PANEL
Anion gap: 14 (ref 5–15)
BUN: 33 mg/dL — ABNORMAL HIGH (ref 8–23)
CO2: 26 mmol/L (ref 22–32)
Calcium: 8.8 mg/dL — ABNORMAL LOW (ref 8.9–10.3)
Chloride: 99 mmol/L (ref 98–111)
Creatinine, Ser: 3.57 mg/dL — ABNORMAL HIGH (ref 0.44–1.00)
GFR calc Af Amer: 15 mL/min — ABNORMAL LOW (ref >60)
GFR calc non Af Amer: 13 mL/min — ABNORMAL LOW (ref >60)
Glucose, Bld: 122 mg/dL — ABNORMAL HIGH (ref 70–99)
Potassium: 3.9 mmol/L (ref 3.5–5.1)
Sodium: 139 mmol/L (ref 135–145)

## 2019-04-16 LAB — URINALYSIS, ROUTINE W REFLEX MICROSCOPIC
Bilirubin Urine: NEGATIVE
Glucose, UA: NEGATIVE mg/dL
Hgb urine dipstick: NEGATIVE
Ketones, ur: NEGATIVE mg/dL
Nitrite: NEGATIVE
Protein, ur: 100 mg/dL — AB
Specific Gravity, Urine: 1.02 (ref 1.005–1.030)
pH: 6 (ref 5.0–8.0)

## 2019-04-16 LAB — CBC
HCT: 30.5 % — ABNORMAL LOW (ref 36.0–46.0)
Hemoglobin: 9.7 g/dL — ABNORMAL LOW (ref 12.0–15.0)
MCH: 29.9 pg (ref 26.0–34.0)
MCHC: 31.8 g/dL (ref 30.0–36.0)
MCV: 94.1 fL (ref 80.0–100.0)
Platelets: 373 10*3/uL (ref 150–400)
RBC: 3.24 MIL/uL — ABNORMAL LOW (ref 3.87–5.11)
RDW: 15.7 % — ABNORMAL HIGH (ref 11.5–15.5)
WBC: 20.6 10*3/uL — ABNORMAL HIGH (ref 4.0–10.5)
nRBC: 0 % (ref 0.0–0.2)

## 2019-04-16 LAB — GLUCOSE, CAPILLARY
Glucose-Capillary: 123 mg/dL — ABNORMAL HIGH (ref 70–99)
Glucose-Capillary: 140 mg/dL — ABNORMAL HIGH (ref 70–99)
Glucose-Capillary: 133 mg/dL — ABNORMAL HIGH (ref 70–99)
Glucose-Capillary: 119 mg/dL — ABNORMAL HIGH (ref 70–99)

## 2019-04-16 MED ORDER — AMIODARONE IV BOLUS ONLY 150 MG/100ML
150.0000 mg | Freq: Once | INTRAVENOUS | Status: AC
  Administered 2019-04-16: 13:00:00 150 mg via INTRAVENOUS
  Filled 2019-04-16: qty 100

## 2019-04-16 MED ORDER — AMIODARONE HCL 200 MG PO TABS
400.0000 mg | ORAL_TABLET | Freq: Two times a day (BID) | ORAL | Status: DC
  Administered 2019-04-16 – 2019-04-17 (×4): 400 mg via ORAL
  Filled 2019-04-16 (×5): qty 2

## 2019-04-16 NOTE — Plan of Care (Signed)
Continue to monitor

## 2019-04-16 NOTE — Progress Notes (Signed)
Monique Henderson  Assessment/ Plan: Pt is a 68 y.o. 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  #AoCKD4, right now dialyzing as an AKI, but very possiblewill not recover sufficient GFR to come off dialysis.  We will begin clip process as AKI but also have ask [facility] to see patient for AVF.  I have discussed uncertainty of how long on HD with pt and why I think permanent access is prudent. No TDC yet as has mild persistent leukocytosis, waiting for this to resolve.  She remains not ready to commit to AVF.  Cont to discuss. Trend UOP and labs for any GFR recovery.  WBC still up, rising, no TDC yet.  Still using L Temp [place] HD cath.  Cont on THS Schedule, next 11/17  #Hypertension: Variable but much improved,  Cont UF.   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  Per CT surgery.  #Anemia, acute blood loss: Monitor hemoglobin.  Transfuse as needed.  # Leukocytosis   Subjective:   No new issues  She remains unready for AVF, not much discussion from her today.  She is hopeful for GFR recovery  UOP not suggestive of inc GFR  HD yesterday with 3L UF   Objective Vital signs in last 24 hours: Vitals:   04/16/19 0240 04/16/19 0355 04/16/19 0456 04/16/19 0858  BP: 111/80  (!) 139/93 140/78  Pulse: 65  77   Resp: (!) 23 (!) 23 (!) 22 (!) 31  Temp: 98 F (36.7 C)  98.8 F (37.1 C) 99.4 F (37.4 C)  TempSrc: Oral  Oral Oral  SpO2: 99%  96% 100%  Weight: 52.4 kg 52.4 kg    Height:       Weight change: 2.1 kg  Intake/Output Summary (Last 24 hours) at 04/16/2019 1106 Last data filed at 04/16/2019 0900 Gross per 24 hour  Intake 50 ml  Output 3101 ml  Net -3051 ml       Labs: Basic Metabolic  Panel: Recent Labs  Lab 04/11/19 0802  04/13/19 0755 04/15/19 1357 04/16/19 0612  NA 138   < > 139 140 139  K 3.9   < > 3.5 3.5 3.9  CL 101   < > 100 100 99  CO2 26   < > 25 24 26   GLUCOSE 147*   < > 113* 121* 122*  BUN 57*   < > 46* 43* 33*  CREATININE 4.92*   < > 3.98* 3.36* 3.57*  CALCIUM 8.4*   < > 8.9 8.8* 8.8*  PHOS 4.6  --  4.2 3.9  --    < > = values in this interval not displayed.   Liver Function Tests: Recent Labs  Lab 04/11/19 0802 04/13/19 0755 04/15/19 1357  ALBUMIN 2.7* 2.9* 2.7*   No results for input(s): LIPASE, AMYLASE in the last 168 hours. No results for input(s): AMMONIA in the last 168 hours. CBC: Recent Labs  Lab 04/10/19 0327  04/12/19 0328 04/13/19 0755 04/14/19 0419 04/15/19 1357 04/16/19 0612  WBC 8.8   < > 13.0* 14.2* 14.5* 19.3* 20.6*  NEUTROABS 6.6  --   --   --   --  15.5*  --   HGB 9.2*   < > 10.2* 9.8* 9.9* 9.5* 9.7*  HCT 29.0*   < > 31.5* 30.8* 30.7* 30.1* 30.5*  MCV 93.5   < > 93.2 93.1 92.2 93.5 94.1  PLT 147*   < > 204 277 314 394 373   < > = values in this interval not displayed.   Cardiac Enzymes: No results for input(s): CKTOTAL, CKMB, CKMBINDEX, TROPONINI in the last 168 hours. CBG: Recent Labs  Lab 04/14/19 2141 04/15/19 0645 04/15/19 1202 04/15/19 1744 04/16/19 0612  GLUCAP 116* 167* 106* 155* 123*    Iron Studies: No results for input(s): IRON, TIBC, TRANSFERRIN, FERRITIN in the last 72 hours. Studies/Results: Vas [place] Upper Ext Vein Mapping (pre-op Avf)  Result Date: 04/15/2019 UPPER EXTREMITY VEIN MAPPING  Indications: Pre-access. Comparison Study: No prior studies. Performing Technologist: Oliver Hum RVT  Examination Guidelines: A complete evaluation includes B-mode imaging, spectral Doppler, color Doppler, and power Doppler as needed of all accessible portions of each vessel. Bilateral testing is considered an integral part of a complete examination. Limited examinations for reoccurring indications may  be performed as noted. +-----------------+-------------+----------+---------+ Right Cephalic   Diameter (cm)Depth (cm)Findings  +-----------------+-------------+----------+---------+ Shoulder             0.26        0.52             +-----------------+-------------+----------+---------+ Prox upper arm       0.26        0.40             +-----------------+-------------+----------+---------+ Mid upper arm        0.20        0.24             +-----------------+-------------+----------+---------+ Dist upper arm       0.22        0.27             +-----------------+-------------+----------+---------+ Antecubital fossa    0.17        0.36   branching +-----------------+-------------+----------+---------+ Prox forearm         0.34        0.18   Thrombus  +-----------------+-------------+----------+---------+ Mid forearm          0.26        0.21   Thrombus  +-----------------+-------------+----------+---------+ Dist forearm         0.30        0.19   Thrombus  +-----------------+-------------+----------+---------+ +-----------------+-------------+----------+--------+ Right Basilic    Diameter (cm)Depth (cm)Findings +-----------------+-------------+----------+--------+ Shoulder             0.32        0.34            +-----------------+-------------+----------+--------+ Mid upper arm        0.27        0.66            +-----------------+-------------+----------+--------+ Dist upper arm       0.24        0.54            +-----------------+-------------+----------+--------+ Antecubital fossa    0.18        0.29            +-----------------+-------------+----------+--------+ Prox forearm         0.13        0.25            +-----------------+-------------+----------+--------+ Mid forearm          0.09        0.18            +-----------------+-------------+----------+--------+  Distal forearm       0.11        0.18             +-----------------+-------------+----------+--------+ +-----------------+-------------+----------+--------+ Left Cephalic    Diameter (cm)Depth (cm)Findings +-----------------+-------------+----------+--------+ Shoulder             0.31        0.49            +-----------------+-------------+----------+--------+ Prox upper arm       0.23        0.43            +-----------------+-------------+----------+--------+ Mid upper arm        0.17        0.33            +-----------------+-------------+----------+--------+ Dist upper arm       0.20        0.30            +-----------------+-------------+----------+--------+ Antecubital fossa    0.23        0.32            +-----------------+-------------+----------+--------+ Prox forearm         0.21        0.30            +-----------------+-------------+----------+--------+ Mid forearm          0.09        0.19            +-----------------+-------------+----------+--------+ Dist forearm         0.06        0.13            +-----------------+-------------+----------+--------+ +-----------------+-------------+----------+---------+ Left Basilic     Diameter (cm)Depth (cm)Findings  +-----------------+-------------+----------+---------+ Shoulder             0.38        0.62             +-----------------+-------------+----------+---------+ Prox upper arm       0.33        0.59             +-----------------+-------------+----------+---------+ Mid upper arm        0.31        0.59             +-----------------+-------------+----------+---------+ Dist upper arm       0.22        0.49             +-----------------+-------------+----------+---------+ Antecubital fossa    0.24        0.42   branching +-----------------+-------------+----------+---------+ Prox forearm         0.18        0.26   branching +-----------------+-------------+----------+---------+ Mid forearm          0.11        0.23              +-----------------+-------------+----------+---------+ Distal forearm       0.08        0.17             +-----------------+-------------+----------+---------+ *See table(s) above for measurements and observations.  Diagnosing physician: Monica Martinez MD Electronically signed by Monica Martinez MD on 04/15/2019 at 10:33:54 AM.    Final     Medications: Infusions: . sodium chloride Stopped (04/09/19 0854)  . piperacillin-tazobactam (ZOSYN)  IV 2.25 g (04/16/19 3329)    Scheduled Medications: . amLODipine  10 mg Oral Daily  . aspirin  324 mg Oral  Daily  . Chlorhexidine Gluconate Cloth  6 each Topical Daily  . Chlorhexidine Gluconate Cloth  6 each Topical Q0600  . cloNIDine  0.3 mg Transdermal Weekly  . docusate sodium  100 mg Oral Daily  . feeding supplement (NEPRO CARB STEADY)  237 mL Oral BID BM  . hydrALAZINE  25 mg Oral Q8H  . insulin aspart  0-9 Units Subcutaneous TID WC  . mouth rinse  15 mL Mouth Rinse BID  . metoprolol tartrate  50 mg Oral TID  . multivitamin  1 tablet Oral QHS  . pantoprazole  40 mg Oral Daily  . senna-docusate  2 tablet Oral BID  . sodium chloride flush  3 mL Intravenous Once    have reviewed scheduled and prn medications.  Physical Exam: No significant change General: RA NAD heart:RRR, s1s2 nl, no rub Lungs: CTAB Abdomen:soft, Non-tender, non-distended Extremities:No LE edema Dialysis Access: Left subclavian IJ catheter was placed on 11/5.  Site clean. Neurology: Alert awake and following commands.  [place] 04/16/2019,11:06 AM  LOS: 13 days

## 2019-04-16 NOTE — Progress Notes (Signed)
[facility]  Subjective  - States she wants to give her kidney more time.   Objective 140/78 77 99.4 F (37.4 C) (Oral) (!) 31 100%  Intake/Output Summary (Last 24 hours) at 04/16/2019 0942 Last data filed at 04/16/2019 0900 Gross per 24 hour  Intake 50 ml  Output 3301 ml  Net -3251 ml    Palpable radial pulse bilaterally  Laboratory Lab Results: Recent Labs    04/15/19 1357 04/16/19 0612  WBC 19.3* 20.6*  HGB 9.5* 9.7*  HCT 30.1* 30.5*  PLT 394 373   BMET Recent Labs    04/15/19 1357 04/16/19 0612  NA 140 139  K 3.5 3.9  CL 100 99  CO2 24 26  GLUCOSE 121* 122*  BUN 43* 33*  CREATININE 3.36* 3.57*  CALCIUM 8.8* 8.8*    COAG Lab Results  Component Value Date   INR 1.3 (H) 04/04/2019   INR 1.0 04/03/2019   INR 1.0 12/09/2016   No results found for: PTT  Assessment/Planning:  68 year old female presented with aortic dissection requiring open repair with CT surgery.  Vascular surgery was consulted for AV fistula placement and TDC.    Reviewed vein mapping and likely has option for left basilic vein fistula.  WBC continues to rise - now 20.  Would be hesitant to exchange temporary catheter for tunneled catheter with rising leukocytosis.  In addition, patient states she wants to give her kidneys more time to recover.   Marty Heck 04/16/2019 9:42 AM --

## 2019-04-16 NOTE — Progress Notes (Signed)
Patient Monique Henderson 89 with PT. Patient states SOB, no chest pain. EKG done. BP 117/77 O2 99% on 2 liters [place]. Dr. Cyndia Bent notified.

## 2019-04-16 NOTE — Progress Notes (Addendum)
[address]       [place] [address]             [phone]      12 Days Post-Op Procedure(s) (LRB): THORACIC ASCENDING ANEURYSM REPAIR (AAA)  USING 28 MM X 30 CM HEMASHIELD PLATINUM VASCULAR GRAFT (N/A) Transesophageal Echocardiogram (Tee) (N/A)   Subjective:  Patient wants to go home.  She has no other complaints.  She denies chest pain.  She is not coughing up any sputum.  When she voids she denies pain and burning.  Objective: Vital signs in last 24 hours: Temp:  [98 F (36.7 C)-98.8 F (37.1 C)] 98.8 F (37.1 C) (11/15 0456) Pulse Rate:  [65-105] 77 (11/15 0456) Cardiac Rhythm: Normal sinus rhythm (11/14 1900) Resp:  [18-31] 22 (11/15 0456) BP: (106-189)/(77-98) 139/93 (11/15 0456) SpO2:  [91 %-100 %] 96 % (11/15 0456) Weight:  [52.4 kg-54.6 kg] 52.4 kg (11/15 0355)  Intake/Output from previous day: 11/14 0701 - 11/15 0700 In: 150 [IV Piggyback:150] Out: 3201 [Urine:200]  General appearance: alert, cooperative and no distress Heart: regular rate and rhythm Lungs: clear to auscultation bilaterally Abdomen: soft, non-tender; bowel sounds normal; no masses,  no organomegaly Extremities: extremities normal, atraumatic, no cyanosis or edema Wound: clean and dry  Lab Results: Recent Labs    04/15/19 1357 04/16/19 0612  WBC 19.3* 20.6*  HGB 9.5* 9.7*  HCT 30.1* 30.5*  PLT 394 373   BMET:  Recent Labs    04/15/19 1357 04/16/19 0612  NA 140 139  K 3.5 3.9  CL 100 99  CO2 24 26  GLUCOSE 121* 122*  BUN 43* 33*  CREATININE 3.36* 3.57*  CALCIUM 8.8* 8.8*    PT/INR: No results for input(s): LABPROT, INR in the last 72 hours. ABG    Component Value Date/Time   PHART 7.407 04/07/2019 0833   HCO3 24.5 04/07/2019 0833   TCO2 26 04/07/2019 0833   ACIDBASEDEF 8.0 (H) 04/06/2019 1748   O2SAT 96.0 04/07/2019 0833   CBG (last 3)  Recent Labs    04/15/19 1202 04/15/19 1744 04/16/19 0612  GLUCAP 106* 155* 123*     Assessment/Plan: S/P Procedure(s) (LRB): THORACIC ASCENDING ANEURYSM REPAIR (AAA)  USING 28 MM X 30 CM HEMASHIELD PLATINUM VASCULAR GRAFT (N/A) Transesophageal Echocardiogram (Tee) (N/A)  1. CV- NSR, + HTN, improved- continue Clonidine, Lopressor, Norvasc, Hydralazine 2. Pulm- stable, CXR without significant effusions, continue IS 3. Renal- AKI on CKD- patient dialyzed yesterday per nephrology, needs to get permanent access placed- VVS is following 4. ID- afebrile, + leukocytosis up to 21.5K, no obvious source, wounds do not appear to be infected, will send urine, get blood cultures, currently on Zosyn for pulmonary infiltrate Day 3... can not have dialysis access placed until this resolves 5. Deconditioning- PT/OT 6. Dispo- patient remains clinically stable, no signs/symptoms of infection, however leukocytosis is up to 21.5 will get Urine and blood cultures, dialysis line has been in place 9 days it also doesn't appear to be infected, will continue on Zosyn for now, HTN improved, continue current care need to rule out infectious etiology so permanent dialysis access can be placed   LOS: 13 days    Ellwood Handler, PA-C  04/16/2019   Chart reviewed, patient examined, agree with above. She has progressive leukocytosis of undetermined etiology. Incisions look fine. She is afebrile. CXR only shows left basilar atelectasis. She has been on Zosyn for possible infiltrate. She has a temporary HD catheter in  and I wonder if that or her peripheral IV could be sources. Cultures ordered.  She went into atrial flutter this afternoon with rate 110-120. I rapid atrial paced to sinus but she went back into atrial flutter in less than a minute. Will give her a bolus of amiodarone and start po. Continue Lopressor.

## 2019-04-16 NOTE — Progress Notes (Signed)
Occupational Therapy Treatment Patient Details Name: Monique Henderson MRN: [id number] DOB: November 21, 1950 Today's Date: 04/16/2019    History of present illness 68 y.o. female with PMH of CKD, HTN, and breast CA (in remission) presents with several hour h/o chest pain, which she had never experienced previously, found to have Type A Aortic dissection. Pt underwent Thoracic Ascending Aneurysm repair on 11/3.   OT comments  Patient agreeable to OT.  Continues to require cueing for sternal precautions throughout session, requires increased time for processing, following 1 step commands more consistently today. Discussed home safety, concerns for dc home alone as patient reports "a friend is there in the morning"; requires 2 attempts to correctly draw clock (see below), unable to recall need to call 911 for a fire. After discussion, patient able to verbalize difficulty managing at home and agreeable to initiate looking for increased assistance in the home.   Limited session to EOB, as noted increased HR (103-129) and SOB --applied O2 at 2L with SpO2 98-100%. Updated recommendations below, if she doesn't have 24/7 she will need SNF. Continue cognitive assessments, and recommend SLP consult for cognition.   Follow Up Recommendations  Home health OT;Supervision/Assistance - 24 hour;Other (comment)(aide; if she does not have 24/7 will need SNF )    Equipment Recommendations  3 in 1 bedside commode    Recommendations for Other Services Speech consult    Precautions / Restrictions Precautions Precautions: Fall;Sternal Precaution Booklet Issued: Yes (comment) Precaution Comments: reviewed precautions but requires constant cueing to adhere  Restrictions Weight Bearing Restrictions: Yes Other Position/Activity Restrictions: sternal precautions       Mobility Bed Mobility Overal bed mobility: Needs Assistance Bed Mobility: Supine to Sit;Sit to Supine     Supine to sit: Supervision Sit to supine:  Supervision   General bed mobility comments: increased time and effort, cueing for sternal precautions with poor adherence   Transfers                 General transfer comment: deferred     Balance Overall balance assessment: Mild deficits observed, not formally tested                                         ADL either performed or assessed with clinical judgement   ADL Overall ADL's : Needs assistance/impaired                                     Functional mobility during ADLs: Supervision/safety(limited to EOB) General ADL Comments: limited by weakness, cognition      Vision       Perception     Praxis      Cognition Arousal/Alertness: Awake/alert Behavior During Therapy: Flat affect;Restless Overall Cognitive Status: No family/caregiver present to determine baseline cognitive functioning Area of Impairment: Memory;Following commands;Safety/judgement;Problem solving;Awareness;Attention                   Current Attention Level: Sustained;Selective Memory: Decreased recall of precautions;Decreased short-term memory Following Commands: Follows one step commands inconsistently;Follows one step commands with increased time Safety/Judgement: Decreased awareness of safety;Decreased awareness of deficits Awareness: Emergent Problem Solving: Slow processing;Decreased initiation;Difficulty sequencing;Requires verbal cues;Requires tactile cues General Comments: patient oriented, following 1 step commands inconsistently with increased time.  Engaged in clock drawing task with pt demonstrating ability to place  all numbers and set clock after 2nd trial (initally missing 1 number, and shaping clock as square vs circle). When asked "what would you do if there was a fire" pt responded "get out" then cueing "who would you call?" pt voiced "the emergency number, its in my phone . its 333 something".  After disucssed pt verbalize difficulty  with memory and problem solving, improving awareness to deficits and safety; and now has began to think about having assistance 24/7        Exercises     Shoulder Instructions       General Comments pt on RA initally, SpO2 maintained 98% or greater but noted shallow breathing and SOB--applied O2 at 2L for comfort.  Noted HR ranged throughout session (limited mobility to EOB only) 103-129 and RN notified.  RN performing EKG upon therapist exit     Pertinent Vitals/ Pain       Pain Assessment: No/denies pain  Home Living                                          Prior Functioning/Environment              Frequency  Min 2X/week        Progress Toward Goals  OT Goals(current goals can now be found in the care plan section)  Progress towards OT goals: Progressing toward goals  Acute Rehab OT Goals Patient Stated Goal: to feel better OT Goal Formulation: With patient  Plan Discharge plan needs to be updated;Frequency remains appropriate    Co-evaluation                 AM-PAC OT "6 Clicks" Daily Activity     Outcome Measure   Help from another person eating meals?: A Little Help from another person taking care of personal grooming?: A Little Help from another person toileting, which includes using toliet, bedpan, or urinal?: A Little Help from another person bathing (including washing, rinsing, drying)?: A Little Help from another person to put on and taking off regular upper body clothing?: A Little Help from another person to put on and taking off regular lower body clothing?: A Little 6 Click Score: 18    End of Session Equipment Utilized During Treatment: Oxygen(2L)  OT Visit Diagnosis: Other abnormalities of gait and mobility (R26.89);Muscle weakness (generalized) (M62.81);Other symptoms and signs involving cognitive function;Pain   Activity Tolerance Patient tolerated treatment well   Patient Left in bed;with call bell/phone within  reach;with nursing/sitter in room   Nurse Communication Mobility status;Precautions        Time: 1025-8527 OT Time Calculation (min): 32 min  Charges: OT General Charges $OT Visit: 1 Visit OT Treatments $Self Care/Home Management : 8-22 mins $Therapeutic Activity: 8-22 mins  Delight Stare, [place] Pager [phone] Office [phone]    Delight Stare 04/16/2019, 12:03 PM

## 2019-04-17 LAB — CBC
HCT: 28.1 % — ABNORMAL LOW (ref 36.0–46.0)
Hemoglobin: 9 g/dL — ABNORMAL LOW (ref 12.0–15.0)
MCH: 30.1 pg (ref 26.0–34.0)
MCHC: 32 g/dL (ref 30.0–36.0)
MCV: 94 fL (ref 80.0–100.0)
Platelets: 425 10*3/uL — ABNORMAL HIGH (ref 150–400)
RBC: 2.99 MIL/uL — ABNORMAL LOW (ref 3.87–5.11)
RDW: 15.9 % — ABNORMAL HIGH (ref 11.5–15.5)
WBC: 17.5 10*3/uL — ABNORMAL HIGH (ref 4.0–10.5)
nRBC: 0 % (ref 0.0–0.2)

## 2019-04-17 LAB — BASIC METABOLIC PANEL
Anion gap: 14 (ref 5–15)
BUN: 47 mg/dL — ABNORMAL HIGH (ref 8–23)
CO2: 26 mmol/L (ref 22–32)
Calcium: 8.5 mg/dL — ABNORMAL LOW (ref 8.9–10.3)
Chloride: 97 mmol/L — ABNORMAL LOW (ref 98–111)
Creatinine, Ser: 4.94 mg/dL — ABNORMAL HIGH (ref 0.44–1.00)
GFR calc Af Amer: 10 mL/min — ABNORMAL LOW (ref >60)
GFR calc non Af Amer: 9 mL/min — ABNORMAL LOW (ref >60)
Glucose, Bld: 128 mg/dL — ABNORMAL HIGH (ref 70–99)
Potassium: 3.5 mmol/L (ref 3.5–5.1)
Sodium: 137 mmol/L (ref 135–145)

## 2019-04-17 LAB — GLUCOSE, CAPILLARY
Glucose-Capillary: 148 mg/dL — ABNORMAL HIGH (ref 70–99)
Glucose-Capillary: 167 mg/dL — ABNORMAL HIGH (ref 70–99)
Glucose-Capillary: 108 mg/dL — ABNORMAL HIGH (ref 70–99)
Glucose-Capillary: 98 mg/dL (ref 70–99)

## 2019-04-17 NOTE — Progress Notes (Signed)
PT Cancellation Note  Patient Details Name: Monique Henderson MRN: [id number] DOB: 1950-10-08   Cancelled Treatment:    Reason Eval/Treat Not Completed: Patient at procedure or test/unavailable Pt working with cardiac rehab. Will follow.   [name] 04/17/2019, 4:32 PM Marisa Severin, PT, DPT Acute Rehabilitation Services Pager [phone] Office [phone]

## 2019-04-17 NOTE — Care Management Important Message (Signed)
Important Message  Patient Details  Name: Monique Henderson MRN: [id number] Date of Birth: 17-Dec-1950   Medicare Important Message Given:  Yes     [name] 04/17/2019, 2:16 PM

## 2019-04-17 NOTE — Progress Notes (Signed)
Patient ID: Monique Henderson, female   DOB: 1951/01/02, 68 y.o.   MRN: [id number]   Request made last week for tunneled dialysis catheter placement Pt has temp cath--- functioning well  Leukocytosis -- 20.6 yesterday and climbing afeb No steroids  Pt [facility] aware IR will await wbc to trend down  Plan  For tunneled cath in IR when appropriate We will follow chart

## 2019-04-17 NOTE — Progress Notes (Signed)
13 Days Post-Op Procedure(s) (LRB): THORACIC ASCENDING ANEURYSM REPAIR (AAA)  USING 28 MM X 30 CM HEMASHIELD PLATINUM VASCULAR GRAFT (N/A) Transesophageal Echocardiogram (Tee) (N/A) Subjective: Feeling better; no complaints Objective: Vital signs in last 24 hours: Temp:  [98.4 F (36.9 C)-99.4 F (37.4 C)] 98.4 F (36.9 C) (11/16 0400) Pulse Rate:  [60-73] 60 (11/16 0400) Cardiac Rhythm: Normal sinus rhythm (11/15 1900) Resp:  [17-31] 20 (11/16 0400) BP: (111-148)/(66-95) 119/95 (11/16 0400) SpO2:  [98 %-100 %] 100 % (11/16 0400) Weight:  [50 kg] 50 kg (11/16 0400)  Hemodynamic parameters for last 24 hours:    Intake/Output from previous day: 11/15 0701 - 11/16 0700 In: 787.2 [P.O.:600; I.V.:87.2; IV Piggyback:100] Out: 100 [Urine:100] Intake/Output this shift: No intake/output data recorded.  General appearance: alert and cooperative Neurologic: intact Heart: regular rate and rhythm, S1, S2 normal, no murmur, click, rub or gallop Lungs: clear to auscultation bilaterally Abdomen: soft, non-tender; bowel sounds normal; no masses,  no organomegaly Extremities: extremities normal, atraumatic, no cyanosis or edema Wound: c/d/i  Lab Results: Recent Labs    04/16/19 0612 04/17/19 0500  WBC 20.6* 17.5*  HGB 9.7* 9.0*  HCT 30.5* 28.1*  PLT 373 425*   BMET:  Recent Labs    04/16/19 0612 04/17/19 0500  NA 139 137  K 3.9 3.5  CL 99 97*  CO2 26 26  GLUCOSE 122* 128*  BUN 33* 47*  CREATININE 3.57* 4.94*  CALCIUM 8.8* 8.5*    PT/INR: No results for input(s): LABPROT, INR in the last 72 hours. ABG    Component Value Date/Time   PHART 7.407 04/07/2019 0833   HCO3 24.5 04/07/2019 0833   TCO2 26 04/07/2019 0833   ACIDBASEDEF 8.0 (H) 04/06/2019 1748   O2SAT 96.0 04/07/2019 0833   CBG (last 3)  Recent Labs    04/16/19 1604 04/16/19 2053 04/17/19 0628  GLUCAP 133* 119* 148*    Assessment/Plan: S/P Procedure(s) (LRB): THORACIC ASCENDING ANEURYSM REPAIR (AAA)   USING 28 MM X 30 CM HEMASHIELD PLATINUM VASCULAR GRAFT (N/A) Transesophageal Echocardiogram (Tee) (N/A) may be worthwhile to d/c temp dialysis catheter after next session  She will likely need tunneled catheter prior to discharge   LOS: 14 days    Wonda Olds 04/17/2019

## 2019-04-17 NOTE — Progress Notes (Signed)
CARDIAC REHAB PHASE I   PRE:  Rate/Rhythm: 58 SB    BP: sitting 118/72    SaO2: 98 2L, 94 RA  MODE:  Ambulation: 430 ft   POST:  Rate/Rhythm: 78 SR    BP: sitting 128/70     SaO2: 95 RA  Pt in bed, willing to walk. Able to st precautions. Sat up independently in bed then moved legs over, keeping sternal precautions. Used RW, fairly steady, had assist x2 but did not require assist. No major c/o however she did say the wrong thing a couple of times (waffle for omelet). Planned to go to recliner after walk but then pt refused.  Returned to bed, d/c'd O2, set bed alarm. Encouraged more walking and IS (600 mL).  [id number]  [place], ACSM 04/17/2019 11:01 AM

## 2019-04-17 NOTE — Progress Notes (Signed)
[facility] NEPHROLOGY PROGRESS NOTE  Assessment/ Plan: Pt is a 68 y.o. 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  #AoCKD4, right now dialyzing as an AKI, but very possiblewill not recover sufficient GFR to come off dialysis.  We will begin clip process as AKI.  Awaiting leukocytosis to resolve before [facility] or AVF--> appreciate IR and VVS.   Cont on THS Schedule, next 11/17.  #Hypertension: Variable,  Cont UF.   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  Per CT surgery.  #Anemia, acute blood loss: Monitor hemoglobin.  Transfuse as needed.   Subjective:  HD for tomorrow.  Having difficulty today emotionally with needing dialysis.     Objective Vital signs in last 24 hours: Vitals:   04/16/19 1939 04/16/19 2355 04/17/19 0400 04/17/19 1113  BP: (!) 148/77 126/73 (!) 119/95 116/67  Pulse: 65 69 60 64  Resp: 20 19 20  (!) 22  Temp: 98.5 F (36.9 C) 98.9 F (37.2 C) 98.4 F (36.9 C) (!) 97.3 F (36.3 C)  TempSrc: Oral Oral Oral Oral  SpO2: 98% 99% 100% 90%  Weight:   50 kg   Height:       Weight change: -4.6 kg  Intake/Output Summary (Last 24 hours) at 04/17/2019 1306 Last data filed at 04/17/2019 0900 Gross per 24 hour  Intake 427.19 ml  Output 100 ml  Net 327.19 ml       Labs: Basic Metabolic Panel: Recent Labs  Lab 04/11/19 0802  04/13/19 0755 04/15/19 1357 04/16/19 0612 04/17/19 0500  NA 138   < > 139 140 139 137  K 3.9   < > 3.5 3.5 3.9 3.5  CL 101   < > 100 100 99 97*  CO2 26   < > 25 24 26 26   GLUCOSE 147*   < > 113* 121* 122* 128*  BUN 57*   < > 46* 43* 33* 47*  CREATININE 4.92*   < > 3.98* 3.36* 3.57* 4.94*  CALCIUM 8.4*   < > 8.9 8.8* 8.8* 8.5*  PHOS 4.6  --  4.2 3.9  --   --    <  > = values in this interval not displayed.   Liver Function Tests: Recent Labs  Lab 04/11/19 0802 04/13/19 0755 04/15/19 1357  ALBUMIN 2.7* 2.9* 2.7*   No results for input(s): LIPASE, AMYLASE in the last 168 hours. No results for input(s): AMMONIA in the last 168 hours. CBC: Recent Labs  Lab 04/13/19 0755 04/14/19 0419 04/15/19 1357 04/16/19 0612 04/17/19 0500  WBC 14.2* 14.5* 19.3* 20.6* 17.5*  NEUTROABS  --   --  15.5*  --   --   HGB 9.8* 9.9* 9.5* 9.7* 9.0*  HCT 30.8* 30.7* 30.1* 30.5* 28.1*  MCV 93.1 92.2 93.5 94.1 94.0  PLT 277 314 394 373 425*   Cardiac Enzymes: No results for input(s): CKTOTAL, CKMB, CKMBINDEX, TROPONINI in the last 168 hours. CBG: Recent Labs  Lab 04/16/19 0612 04/16/19 1151 04/16/19 1604 04/16/19 2053 04/17/19 0628  GLUCAP 123* 140* 133* 119* 148*    Iron Studies: No results for input(s): IRON, TIBC, TRANSFERRIN, FERRITIN in the last 72 hours. Studies/Results: Dg Chest Port 1 View  Result Date: 04/16/2019 CLINICAL DATA:  Re-evaluate lung infiltrates EXAM: PORTABLE CHEST 1  VIEW COMPARISON:  04/13/2019, 04/10/2019, 04/09/2019, 04/08/2019 FINDINGS: Post sternotomy changes. Left-sided central venous catheter with tip projecting over venous confluence. Decreased right pleural effusion. Similar small left effusion and left basilar and lingular airspace disease. Stable cardiomegaly. No pneumothorax. IMPRESSION: 1. Slightly improved aeration of right base with decreased right pleural effusion 2. No significant change in small left effusion and left basilar airspace disease. 3. No change in cardiomegaly Electronically Signed   By: Donavan Foil M.D.   On: 04/16/2019 16:16    Medications: Infusions: . sodium chloride Stopped (04/09/19 0854)  . piperacillin-tazobactam (ZOSYN)  IV 2.25 g (04/17/19 0554)    Scheduled Medications: . amiodarone  400 mg Oral BID  . amLODipine  10 mg Oral Daily  . aspirin  324 mg Oral Daily  . Chlorhexidine  Gluconate Cloth  6 each Topical Daily  . Chlorhexidine Gluconate Cloth  6 each Topical Q0600  . cloNIDine  0.3 mg Transdermal Weekly  . docusate sodium  100 mg Oral Daily  . feeding supplement (NEPRO CARB STEADY)  237 mL Oral BID BM  . hydrALAZINE  25 mg Oral Q8H  . insulin aspart  0-9 Units Subcutaneous TID WC  . mouth rinse  15 mL Mouth Rinse BID  . metoprolol tartrate  50 mg Oral TID  . multivitamin  1 tablet Oral QHS  . pantoprazole  40 mg Oral Daily  . senna-docusate  2 tablet Oral BID  . sodium chloride flush  3 mL Intravenous Once    have reviewed scheduled and prn medications.  Physical Exam:  General: NAD heart:RRR, s1s2 nl, no rub Lungs: clear bilaterally Abdomen:soft, Non-tender, non-distended Extremities:No LE edema Dialysis Access: Left subclavian IJ catheter was placed on 11/5.  Site clean. Neurology: Alert awake and following commands.  Arham Symmonds 04/17/2019,1:06 PM  LOS: 14 days

## 2019-04-18 LAB — GLUCOSE, CAPILLARY
Glucose-Capillary: 118 mg/dL — ABNORMAL HIGH (ref 70–99)
Glucose-Capillary: 95 mg/dL (ref 70–99)
Glucose-Capillary: 103 mg/dL — ABNORMAL HIGH (ref 70–99)
Glucose-Capillary: 98 mg/dL (ref 70–99)

## 2019-04-18 MED ORDER — AMIODARONE HCL 200 MG PO TABS
200.0000 mg | ORAL_TABLET | Freq: Two times a day (BID) | ORAL | Status: DC
  Administered 2019-04-18 – 2019-04-20 (×6): 200 mg via ORAL
  Filled 2019-04-18 (×6): qty 1

## 2019-04-18 MED ORDER — HEPARIN SODIUM (PORCINE) 1000 UNIT/ML IJ SOLN
INTRAMUSCULAR | Status: AC
  Filled 2019-04-18: qty 3

## 2019-04-18 NOTE — Progress Notes (Signed)
Nutrition Follow up  DOCUMENTATION CODES:   Not applicable  INTERVENTION:    Continue Nepro Shake po BID, each supplement provides 425 kcal and 19 grams protein  Add Magic cup TID with meals, each supplement provides 290 kcal and 9 grams of protein  Continue renal MVI daily   NUTRITION DIAGNOSIS:   Increased nutrient needs related to post-op healing as evidenced by estimated needs.  Ongoing  GOAL:   Patient will meet greater than or equal to 90% of their needs   Progressing  MONITOR:   PO intake, Supplement acceptance, Weight trends, Labs, I & O's  REASON FOR ASSESSMENT:   LOS    ASSESSMENT:   Patient with PMH significant for CKD IV, HTN, Hepatitis C, and breast cancer in remission. Presents this admission with Type A Aortic dissection.   11/3- s/p repair type 1 dissection 11/4-extubated  11/5- start HD  Pt in HD at time of RD visit. Likely will requiring long term HD. Meal completions charted as 50-100% for her last 4 meals. Did not drink Nepro today. Will add magic cups.   Admission weight: 58.9 kg  Current weight: 52.5 kg   I/O: -4,935 ml since admit UOP: 300 ml x 24 hrs  Last HD today: 1175 ml net UF  Medications: colace, rena-vit senokot Labs: CBG 95-167  Diet Order:   Diet Order            Diet renal with fluid restriction Fluid restriction: 1200 mL Fluid; Room service appropriate? No; Fluid consistency: Thin  Diet effective now              EDUCATION NEEDS:   Not appropriate for education at this time  Skin:  Skin Assessment: Skin Integrity Issues: Skin Integrity Issues:: Incisions Incisions: chest  Last BM:  11/15  Height:   Ht Readings from Last 1 Encounters:  04/04/19 5\' 3"  (1.6 m)    Weight:   Wt Readings from Last 1 Encounters:  04/18/19 52.5 kg    Ideal Body Weight:  52.3 kg  BMI:  Body mass index is 20.5 kg/m.  Estimated Nutritional Needs:   Kcal:  1700-1900 kcal  Protein:  85-100 grams  Fluid:  1000 ml  + UOP   Mariana Single RD, LDN Clinical Nutrition Pager # [phone]

## 2019-04-18 NOTE — Progress Notes (Addendum)
      [address]       [place] [address]             [phone]        14 Days Post-Op Procedure(s) (LRB): THORACIC ASCENDING ANEURYSM REPAIR (AAA)  USING 28 MM X 30 CM HEMASHIELD PLATINUM VASCULAR GRAFT (N/A) Transesophageal Echocardiogram (Tee) (N/A)  Subjective: Patient states her breathing is not good sometimes, but she could not explain it  Objective: Vital signs in last 24 hours: Temp:  [97.3 F (36.3 C)-98.9 F (37.2 C)] 98.3 F (36.8 C) (11/17 0404) Pulse Rate:  [58-64] 58 (11/17 0404) Cardiac Rhythm: Normal sinus rhythm (11/17 0155) Resp:  [17-26] 19 (11/17 0404) BP: (116-150)/(67-83) 142/82 (11/17 0404) SpO2:  [95 %-98 %] 96 % (11/17 0404) Weight:  [53.7 kg] 53.7 kg (11/17 0404)  Pre op weight 58.9 kg Current Weight  04/18/19 53.7 kg       Intake/Output from previous day: 11/16 0701 - 11/17 0700 In: 570 [P.O.:360; I.V.:110; IV Piggyback:100] Out: 300 [Urine:300]   Physical Exam:  Cardiovascular: RRR Pulmonary: Slightly diminished bibasilar breath sounds Abdomen: Soft, non tender, bowel sounds present. Extremities: No lower extremity edema. Wounds: Clean and dry.  No erythema or signs of infection.  Lab Results: CBC: Recent Labs    04/16/19 0612 04/17/19 0500  WBC 20.6* 17.5*  HGB 9.7* 9.0*  HCT 30.5* 28.1*  PLT 373 425*   BMET:  Recent Labs    04/16/19 0612 04/17/19 0500  NA 139 137  K 3.9 3.5  CL 99 97*  CO2 26 26  GLUCOSE 122* 128*  BUN 33* 47*  CREATININE 3.57* 4.94*  CALCIUM 8.8* 8.5*    PT/INR:  Lab Results  Component Value Date   INR 1.3 (H) 04/04/2019   INR 1.0 04/03/2019   INR 1.0 12/09/2016   ABG:  INR: Will add last result for INR, ABG once components are confirmed Will add last 4 CBG results once components are confirmed  Assessment/Plan:  1. CV - SR with HR in the 60's this am. On Amiodarone 400 mg bid, Amlodipine 10 mg daily, Clonidine patch, Hydralazine 25 mg tid, and Lopressor 50 mg  tid. Will decrease Amiodarone to 200 mg bid. Monitor HR as may need to decrease Lopressor. 2.  Pulmonary - On 2 liters of oxygen via [place] this am. Encourage incentive spirometer 3. Acute on chronic CKD (stage IV)-Creatinine yesterday 4.94. Once leukocytosis resolved, will need TDC 4.  Anemia of chronic disease, surgery- 5. Leukocytosis-WBC yesterday decreased to 17,500. Will order CBC for today. ? Etiology. UA showed trace leukocytes and rare bacteria, no sign of wound infection, no PNA noted on last CXR. On empiric Zosyn 6. Remove EPW 7. CBGs 108/98/118. Pre op HGA1C 6. She likely has pre diabetes so will stop accu checks and SS PRN  Milbert Bixler M ZimmermanPA-C 04/18/2019,7:08 AM

## 2019-04-18 NOTE — Progress Notes (Signed)
Occupational Therapy Treatment Patient Details Name: Monique Henderson MRN: [id number] DOB: [birth year] Today's Date: 04/18/2019    History of present illness 68 y.o. female with PMH of CKD, HTN, and breast CA (in remission) presents with several hour h/o chest pain, which she had never experienced previously, found to have Type A Aortic dissection. Pt underwent Thoracic Ascending Aneurysm repair on 11/3.   OT comments  Upon entering the room, pt awake but restless in bed with all the lights off. Pt verbalized being hungry but she was unable to problem solve even with mod verbal guidance cues how to obtain food on unit. OT assisted pt with call and placing dinner order. Pt unable to verbalize any sternal precautions this session and was educated on those as well. Pt began crying and reporting, "I'm so tired" when asked to engaged in additional OT/transfers. OT provided total A to reposition pt in bed this session. Pt would continue to benefit from OT intervention.   Follow Up Recommendations  Home health OT;Supervision/Assistance - 24 hour;Other (comment)    Equipment Recommendations  3 in 1 bedside commode       Precautions / Restrictions Precautions Precautions: Fall;Sternal Restrictions Weight Bearing Restrictions: Yes Other Position/Activity Restrictions: sternal precautions       Mobility Bed Mobility Overal bed mobility: Needs Assistance Bed Mobility: Rolling Rolling: Supervision         General bed mobility comments: increased time and effort, cueing for sternal precautions with poor adherence          ADL either performed or assessed with clinical judgement                  Cognition Arousal/Alertness: Awake/alert Behavior During Therapy: Restless Overall Cognitive Status: No family/caregiver present to determine baseline cognitive functioning Area of Impairment: Memory;Following commands;Safety/judgement;Problem solving;Awareness;Attention        Orientation Level: Disoriented to;Situation;Time Current Attention Level: Sustained;Selective Memory: Decreased recall of precautions;Decreased short-term memory Following Commands: Follows one step commands inconsistently;Follows one step commands with increased time Safety/Judgement: Decreased awareness of safety;Decreased awareness of deficits Awareness: Emergent Problem Solving: Slow processing;Decreased initiation;Difficulty sequencing;Requires verbal cues;Requires tactile cues                     Pertinent Vitals/ Pain       Pain Assessment: No/denies pain         Frequency  Min 2X/week        Progress Toward Goals  OT Goals(current goals can now be found in the care plan section)  Progress towards OT goals: Progressing toward goals  Acute Rehab OT Goals Patient Stated Goal: to get some rest OT Goal Formulation: With patient Time For Goal Achievement: 05/02/19 Potential to Achieve Goals: Good  Plan Discharge plan needs to be updated;Frequency remains appropriate       AM-PAC OT "6 Clicks" Daily Activity     Outcome Measure   Help from another person eating meals?: A Little Help from another person taking care of personal grooming?: A Little Help from another person toileting, which includes using toliet, bedpan, or urinal?: A Little Help from another person bathing (including washing, rinsing, drying)?: A Little Help from another person to put on and taking off regular upper body clothing?: A Little Help from another person to put on and taking off regular lower body clothing?: A Little 6 Click Score: 18    End of Session    OT Visit Diagnosis: Other abnormalities of gait and mobility (R26.89);Muscle weakness (  generalized) (M62.81);Other symptoms and signs involving cognitive function;Pain   Activity Tolerance Patient limited by fatigue   Patient Left in bed;with call bell/phone within reach;with nursing/sitter in room;with bed alarm set   Nurse  Communication          Time: 1554-1610 OT Time Calculation (min): 16 min  Charges: OT General Charges $OT Visit: 1 Visit OT Treatments $Self Care/Home Management : 8-22 mins   Gypsy Decant MS, OTR/L 04/18/2019, 4:12 PM

## 2019-04-18 NOTE — Progress Notes (Signed)
Patient has been accepted at [facility] on a TTS schedule with a seat time of 7:35am. Neprhrologist/Dr. Hollie Salk notified. Patient needs to arrive 20 minutes early to her appointments. Clinic is requesting that she report the day before her first treatment in order to complete paperwork if possible, which Renal Navigator will discuss with her.  Renal Navigator also following for disposition planning and discharge readiness.  Alphonzo Cruise, [employer] Renal Navigator [phone]

## 2019-04-18 NOTE — Progress Notes (Signed)
Renal Navigator met with patient to complete assessment and submit referral for OP HD treatment for AKI to [facility]. Renal Navigator has requested care at [facility], as this is the closet clinic to her home. Patient was easy to engage and appreciative. She would like Renal Navigator to follow up with her and her sister, Retta Diones, once a schedule has been given. She states she plans to utilize [facility] transportation to get to/from OP HD, and that she has already applied for this service. Renal Navigator will follow up to ensure services have been approved.  Renal Navigator will follow closely.   Alphonzo Cruise, [place] Renal Navigator [phone]

## 2019-04-18 NOTE — Progress Notes (Signed)
Hemodialysis treatment ended with 1.5hrs remaining.  HD catheter is functioning poorly.  Maximum BFR obtainable is 200.  Machine requiring constant resetting due to high arterial pressures.  HD catheter ports reversed without positive results for better arterial pressures.  Dr. Hollie Salk notified.  Order received to discontinue treatment and patient will have a new permanent catheter placed on Thursday.

## 2019-04-18 NOTE — Progress Notes (Signed)
Physical Therapy Cancellation Note   04/18/19 1146  PT Visit Information  Last PT Received On 04/18/19  Reason Eval/Treat Not Completed Patient at procedure or test/unavailable. Pt in HD. PT will continue to follow acutely.   Earney Navy, PTA Acute Rehabilitation Services Pager: [phone] Office: [phone]

## 2019-04-18 NOTE — Progress Notes (Signed)
[facility]   Subjective  - Has agreed to fistula.  WBC trending down to 17.   Objective (!) 152/68 61 98.1 F (36.7 C) (Oral) 17 97%  Intake/Output Summary (Last 24 hours) at 04/18/2019 0807 Last data filed at 04/18/2019 0644 Gross per 24 hour  Intake 570 ml  Output 300 ml  Net 270 ml    Palpable radial pulse bilaterally  Laboratory Lab Results: Recent Labs    04/16/19 0612 04/17/19 0500  WBC 20.6* 17.5*  HGB 9.7* 9.0*  HCT 30.5* 28.1*  PLT 373 425*   BMET Recent Labs    04/16/19 0612 04/17/19 0500  NA 139 137  K 3.9 3.5  CL 99 97*  CO2 26 26  GLUCOSE 122* 128*  BUN 33* 47*  CREATININE 3.57* 4.94*  CALCIUM 8.8* 8.5*    COAG Lab Results  Component Value Date   INR 1.3 (H) 04/04/2019   INR 1.0 04/03/2019   INR 1.0 12/09/2016   No results found for: PTT  Assessment/Planning:  68 year old female presented with aortic dissection requiring open repair with CT surgery.  Vascular surgery was consulted for AV fistula placement and TDC.    Reviewed vein mapping and likely has option for left basilic vein fistula. She was refusing, but now agrees to fistula placement.  WBC trending down.  Plan for left arm AVF and [facility] Thursday with Dr. Donzetta Matters.  NPO after midnight tomorrow night.   Marty Heck 04/18/2019 8:07 AM --

## 2019-04-18 NOTE — Progress Notes (Signed)
      [address]       [place] [address]             [phone]       Epicardial pacing wires removed without any resistance. No bleeding from the site. Dressed with 2 x 2 sterile gauze and tape. Patient was instructed to stay in bed laying flat for 1 hour. Nursing notified. Ensured the patient had telephone and call button in reach.   No complications. Patient tolerated removal of pacing wires well.   Nicholes Rough, PA-C

## 2019-04-18 NOTE — Progress Notes (Signed)
  [facility] Progress Note    Assessment/ Plan:   #AoCKD4, right now dialyzing as an AKI, but very possible will not recover sufficient GFR to come off dialysis.   clip in process as AKI. Leukocytosis downtrending, TDC and AVF placement in OR with VVS scheduled 11/19--> L [place] catheter stopped working today so will dialyze after OR Thursday.    #Hypertension: Variable,  Cont UF.   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  Per CT surgery.  #Anemia, acute blood loss: Monitor hemoglobin.  Transfuse as needed.  Subjective:    Seen on HD.  BFR 250 via L Subclavian HD cath.  UF goal 3L.  Crying, feels anxious and scared today.     Objective:   BP 136/82 (BP Location: Right Arm)   Pulse 72   Temp 98.6 F (37 C) (Oral)   Resp (!) 33   Ht 5\' 3"  (1.6 m)   Wt 53.9 kg   SpO2 100%   BMI 21.05 kg/m   Intake/Output Summary (Last 24 hours) at 04/18/2019 1306 Last data filed at 04/18/2019 0800 Gross per 24 hour  Intake 450 ml  Output 200 ml  Net 250 ml   Weight change: 3.706 kg  Physical Exam: Gen: earful on HD CVS: RRR healing sternal scar and R  incision Resp: clear bilaterally no c/w/r Abd: soft nontender NABS Ext: no LE edema ACCESS: L  nontunneled HD cath  Imaging: No results found.  Labs: BMET Recent Labs  Lab 04/12/19 0328 04/13/19 0755 04/15/19 1357 04/16/19 0612 04/17/19 0500  NA 139 139 140 139 137  K 3.6 3.5 3.5 3.9 3.5  CL 103 100 100 99 97*  CO2 25 25 24 26 26   GLUCOSE 137* 113* 121* 122* 128*  BUN 33* 46* 43* 33* 47*  CREATININE 3.48* 3.98* 3.36* 3.57* 4.94*  CALCIUM 8.2* 8.9 8.8* 8.8* 8.5*  PHOS  --  4.2 3.9  --   --    CBC Recent Labs  Lab 04/14/19 0419 04/15/19 1357 04/16/19 0612 04/17/19 0500  WBC 14.5* 19.3* 20.6* 17.5*  NEUTROABS  --  15.5*  --   --   HGB 9.9* 9.5* 9.7* 9.0*  HCT 30.7* 30.1* 30.5* 28.1*  MCV 92.2 93.5 94.1 94.0   PLT 314 394 373 425*    Medications:    . amiodarone  200 mg Oral BID  . amLODipine  10 mg Oral Daily  . aspirin  324 mg Oral Daily  . Chlorhexidine Gluconate Cloth  6 each Topical Daily  . Chlorhexidine Gluconate Cloth  6 each Topical Q0600  . cloNIDine  0.3 mg Transdermal Weekly  . docusate sodium  100 mg Oral Daily  . feeding supplement (NEPRO CARB STEADY)  237 mL Oral BID BM  . heparin      . hydrALAZINE  25 mg Oral Q8H  . mouth rinse  15 mL Mouth Rinse BID  . metoprolol tartrate  50 mg Oral TID  . multivitamin  1 tablet Oral QHS  . pantoprazole  40 mg Oral Daily  . senna-docusate  2 tablet Oral BID  . sodium chloride flush  3 mL Intravenous Once      Madelon Lips, MD 04/18/2019, 1:06 PM

## 2019-04-19 LAB — CBC
HCT: 29.9 % — ABNORMAL LOW (ref 36.0–46.0)
Hemoglobin: 9.6 g/dL — ABNORMAL LOW (ref 12.0–15.0)
MCH: 29.7 pg (ref 26.0–34.0)
MCHC: 32.1 g/dL (ref 30.0–36.0)
MCV: 92.6 fL (ref 80.0–100.0)
Platelets: 474 10*3/uL — ABNORMAL HIGH (ref 150–400)
RBC: 3.23 MIL/uL — ABNORMAL LOW (ref 3.87–5.11)
RDW: 15.6 % — ABNORMAL HIGH (ref 11.5–15.5)
WBC: 13.5 10*3/uL — ABNORMAL HIGH (ref 4.0–10.5)
nRBC: 0 % (ref 0.0–0.2)

## 2019-04-19 LAB — BASIC METABOLIC PANEL
Anion gap: 12 (ref 5–15)
BUN: 35 mg/dL — ABNORMAL HIGH (ref 8–23)
CO2: 25 mmol/L (ref 22–32)
Calcium: 8.8 mg/dL — ABNORMAL LOW (ref 8.9–10.3)
Chloride: 103 mmol/L (ref 98–111)
Creatinine, Ser: 4.19 mg/dL — ABNORMAL HIGH (ref 0.44–1.00)
GFR calc Af Amer: 12 mL/min — ABNORMAL LOW (ref >60)
GFR calc non Af Amer: 10 mL/min — ABNORMAL LOW (ref >60)
Glucose, Bld: 113 mg/dL — ABNORMAL HIGH (ref 70–99)
Potassium: 3.4 mmol/L — ABNORMAL LOW (ref 3.5–5.1)
Sodium: 140 mmol/L (ref 135–145)

## 2019-04-19 LAB — GLUCOSE, CAPILLARY
Glucose-Capillary: 120 mg/dL — ABNORMAL HIGH (ref 70–99)
Glucose-Capillary: 133 mg/dL — ABNORMAL HIGH (ref 70–99)

## 2019-04-19 MED ORDER — METOPROLOL TARTRATE 50 MG PO TABS
50.0000 mg | ORAL_TABLET | Freq: Two times a day (BID) | ORAL | Status: DC
  Administered 2019-04-19 – 2019-04-23 (×9): 50 mg via ORAL
  Filled 2019-04-19: qty 1
  Filled 2019-04-19: qty 2
  Filled 2019-04-19 (×7): qty 1

## 2019-04-19 MED ORDER — SODIUM CHLORIDE 0.9 % IV SOLN
1.5000 g | INTRAVENOUS | Status: AC
  Administered 2019-04-20: 1.5 g via INTRAVENOUS
  Filled 2019-04-19: qty 1.5

## 2019-04-19 MED ORDER — DARBEPOETIN ALFA 60 MCG/0.3ML IJ SOSY: 60.0000 ug | PREFILLED_SYRINGE | INTRAMUSCULAR | Status: DC

## 2019-04-19 NOTE — Progress Notes (Signed)
[id number] Came to see pt to walk. Pt stated she is tired and wants to sleep now. Stated she walked earlier with PT. Discussed with pt the need for 3 walks a day. Had pt use IS and she demonstrated 600 ml three times. Encouraged her to use this also. Pt stated she has had several staff members coming in today and she wants to sleep now. Encouraged her to walk with staff later. Will continue to follow. Graylon Good RN BSN 04/19/2019 1:23 PM

## 2019-04-19 NOTE — Progress Notes (Signed)
Renal Navigator met with patient at bedside to inform her of OP HD treatment schedule at [facility] and provide her with it in writing also. Patient is very pleased and appreciative. She asked for Navigator to contact her sister/Lurlene Fox. Renal Navigator did so in the room with patient and had a long conversation. She will plan to pick patient up at discharge. She also states she can take patient to first appointment if patient's first appointment if she is ready to start in the clinic on Saturday, since patient's sister does not have to work on Saturday. Renal Navigator has reviewed chart and spoken with CM and it appears that patient is nearing discharge. Patient is hopeful that she will be able to go home soon and states she is feeling well. Renal Navigator states that we will tentatively plan for first OP HD clinic treatment for Saturday, if she is discharged Friday, and both patient and her sister understand that this is pending medical clearance by her primary doctor. They both know that she will have HD access surgery and HD here tomorrow. Renal Navigator has spoken with [facility] Eligibility Coordinator and will complete [employer] application Part B with patient in order to complete her enrollment. Renal Navigator spoke with patient's sister about arranging a "standing order" starting next week. Patient and her sister were both very appreciative of support and assistance. Renal Navigator will continue to follow closely and will follow up with patient and sister once discharge date can be confirmed. Renal Navigator has requested medical and discharge readiness updates from CM.  Alphonzo Cruise, [place] Renal Navigator [phone]

## 2019-04-19 NOTE — Progress Notes (Signed)
Physical Therapy Treatment Patient Details Name: Monique Henderson MRN: [id number] DOB: [birth year] Today's Date: 04/19/2019    History of Present Illness 68 y.o. female with PMH of CKD, HTN, and breast CA (in remission) presents with several hour h/o chest pain, which she had never experienced previously, found to have Type A Aortic dissection. Pt underwent Thoracic Ascending Aneurysm repair on 11/3.    PT Comments    Pt tolerates treatment well after requiring PT encouragement to participate in session. PT demonstrates improved bed mobility and transfer quality, at modI level with use of RW. Pt is able to ambulate community distances with use of RW, no significant balance deviations noted. Pt will benefit from additional acute PT session for stair training as she must negotiate one flight of steps to enter the home. PT updating recs to no PT follow-up after discharge.   Follow Up Recommendations  No PT follow up     Equipment Recommendations  Rolling walker with 5" wheels    Recommendations for Other Services       Precautions / Restrictions Precautions Precautions: Fall;Sternal Precaution Comments: reviewed precautions but requires intermittent cueing to adhere  Restrictions Weight Bearing Restrictions: No Other Position/Activity Restrictions: sternal precautions    Mobility  Bed Mobility Overal bed mobility: Independent                Transfers Overall transfer level: Modified independent Equipment used: Rolling walker (2 wheeled) Transfers: Sit to/from Stand Sit to Stand: Modified independent (Device/Increase time)            Ambulation/Gait Ambulation/Gait assistance: Modified independent (Device/Increase time) Gait Distance (Feet): 300 Feet Assistive device: Rolling walker (2 wheeled) Gait Pattern/deviations: Step-through pattern Gait velocity: functional Gait velocity interpretation: 1.31 - 2.62 ft/sec, indicative of limited community  ambulator General Gait Details: steady step through gait, pt reporting mild dizziness requiring brief standing break twice   Stairs             Wheelchair Mobility    Modified Rankin (Stroke Patients Only)       Balance Overall balance assessment: Modified Independent                                          Cognition Arousal/Alertness: Awake/alert Behavior During Therapy: WFL for tasks assessed/performed Overall Cognitive Status: Within Functional Limits for tasks assessed                                        Exercises      General Comments General comments (skin integrity, edema, etc.): pt on RA with VSS during session      Pertinent Vitals/Pain Pain Assessment: 0-10 Pain Score: 9  Pain Location: L chest and incision site Pain Descriptors / Indicators: Aching Pain Intervention(s): Limited activity within patient's tolerance;Patient requesting pain meds-RN notified    Home Living                      Prior Function            PT Goals (current goals can now be found in the care plan section) Acute Rehab PT Goals Patient Stated Goal: to get some rest Progress towards PT goals: Progressing toward goals    Frequency    Min 3X/week  PT Plan Current plan remains appropriate    Co-evaluation              AM-PAC PT "6 Clicks" Mobility   Outcome Measure  Help needed turning from your back to your side while in a flat bed without using bedrails?: None Help needed moving from lying on your back to sitting on the side of a flat bed without using bedrails?: None Help needed moving to and from a bed to a chair (including a wheelchair)?: None Help needed standing up from a chair using your arms (e.g., wheelchair or bedside chair)?: None Help needed to walk in hospital room?: None Help needed climbing 3-5 steps with a railing? : A Little 6 Click Score: 23    End of Session Equipment Utilized During  Treatment: (none) Activity Tolerance: Patient tolerated treatment well Patient left: in chair;with call bell/phone within reach Nurse Communication: Mobility status PT Visit Diagnosis: Other abnormalities of gait and mobility (R26.89)     Time: 8453-6468 PT Time Calculation (min) (ACUTE ONLY): 25 min  Charges:  $Gait Training: 8-22 mins $Therapeutic Activity: 8-22 mins                     Zenaida Niece, PT, DPT Acute Rehabilitation Pager: [phone]    Zenaida Niece 04/19/2019, 10:35 AM

## 2019-04-19 NOTE — Progress Notes (Signed)
  [facility] Progress Note    Assessment/ Plan:   #AoCKD4, right now dialyzing as an AKI, but very possible will not recover sufficient GFR to come off dialysis.   clip complete as AKI- TTS at [facility].  Leukocytosis downtrending, TDC and AVF placement in OR with VVS scheduled 11/19--> L [place] catheter stopped working today so will dialyze after OR Thursday.    #Hypertension: Variable,  Cont UF.   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  Per CT surgery.  #Anemia, acute blood loss: Monitor hemoglobin.  Transfuse as needed. Start darbepoetin 60 Q thurs  Subjective:    Better spirits today.  For [facility] and AVF tomorrow.  Next HD tomorrow as well- catheter didn't run well yesterday.   Objective:   BP 129/76   Pulse 63   Temp 98.9 F (37.2 C) (Oral)   Resp 20   Ht 5\' 3"  (1.6 m)   Wt 52.1 kg   SpO2 95%   BMI 20.34 kg/m   Intake/Output Summary (Last 24 hours) at 04/19/2019 1152 Last data filed at 04/19/2019 0525 Gross per 24 hour  Intake -  Output 1475 ml  Net -1475 ml   Weight change: 0.194 kg  Physical Exam: Gen: earful on HD CVS: RRR healing sternal scar and R [place] incision Resp: clear bilaterally no c/w/r Abd: soft nontender NABS Ext: no LE edema ACCESS: L [place] nontunneled HD cath  Imaging: No results found.  Labs: BMET Recent Labs  Lab 04/13/19 0755 04/15/19 1357 04/16/19 0612 04/17/19 0500 04/19/19 0500  NA 139 140 139 137 140  K 3.5 3.5 3.9 3.5 3.4*  CL 100 100 99 97* 103  CO2 25 24 26 26 25   GLUCOSE 113* 121* 122* 128* 113*  BUN 46* 43* 33* 47* 35*  CREATININE 3.98* 3.36* 3.57* 4.94* 4.19*  CALCIUM 8.9 8.8* 8.8* 8.5* 8.8*  PHOS 4.2 3.9  --   --   --    CBC Recent Labs  Lab 04/15/19 1357 04/16/19 0612 04/17/19 0500 04/19/19 0500  WBC 19.3* 20.6* 17.5* 13.5*  NEUTROABS 15.5*  --   --   --   HGB 9.5* 9.7* 9.0* 9.6*  HCT 30.1* 30.5* 28.1* 29.9*   MCV 93.5 94.1 94.0 92.6  PLT 394 373 425* 474*    Medications:    . amiodarone  200 mg Oral BID  . amLODipine  10 mg Oral Daily  . aspirin  324 mg Oral Daily  . Chlorhexidine Gluconate Cloth  6 each Topical Daily  . Chlorhexidine Gluconate Cloth  6 each Topical Q0600  . cloNIDine  0.3 mg Transdermal Weekly  . docusate sodium  100 mg Oral Daily  . feeding supplement (NEPRO CARB STEADY)  237 mL Oral BID BM  . hydrALAZINE  25 mg Oral Q8H  . mouth rinse  15 mL Mouth Rinse BID  . metoprolol tartrate  50 mg Oral BID  . multivitamin  1 tablet Oral QHS  . pantoprazole  40 mg Oral Daily  . senna-docusate  2 tablet Oral BID  . sodium chloride flush  3 mL Intravenous Once      Madelon Lips, MD 04/19/2019, 11:52 AM

## 2019-04-19 NOTE — Progress Notes (Signed)
    Patient pre-op for left AV fistula verse graft and TDC placement tomorrow by DR. Fuller Plan PA-C

## 2019-04-19 NOTE — Progress Notes (Addendum)
      [address]       [place] [address]             [phone]        15 Days Post-Op Procedure(s) (LRB): THORACIC ASCENDING ANEURYSM REPAIR (AAA)  USING 28 MM X 30 CM HEMASHIELD PLATINUM VASCULAR GRAFT (N/A) Transesophageal Echocardiogram (Tee) (N/A)  Subjective: Patient sleeping this am.  Objective: Vital signs in last 24 hours: Temp:  [97.7 F (36.5 C)-99.4 F (37.4 C)] 98.8 F (37.1 C) (11/18 0511) Pulse Rate:  [60-78] 65 (11/18 0511) Cardiac Rhythm: Normal sinus rhythm;Junctional rhythm (11/18 0052) Resp:  [17-33] 22 (11/18 0524) BP: (110-169)/(60-97) 120/69 (11/18 0511) SpO2:  [95 %-100 %] 95 % (11/18 0511) Weight:  [52.1 kg-53.9 kg] 52.1 kg (11/18 0524)  Pre op weight 58.9 kg Current Weight  04/19/19 52.1 kg       Intake/Output from previous day: 11/17 0701 - 11/18 0700 In: 120 [P.O.:120] Out: 1475 [Urine:300]   Physical Exam:  Cardiovascular: RRR Pulmonary: Slightly diminished bibasilar breath sounds Abdomen: Soft, non tender, bowel sounds present. Extremities: No lower extremity edema. Wounds: Clean and dry.  No erythema or signs of infection.  Lab Results: CBC: Recent Labs    04/17/19 0500 04/19/19 0500  WBC 17.5* 13.5*  HGB 9.0* 9.6*  HCT 28.1* 29.9*  PLT 425* 474*   BMET:  Recent Labs    04/17/19 0500  NA 137  K 3.5  CL 97*  CO2 26  GLUCOSE 128*  BUN 47*  CREATININE 4.94*  CALCIUM 8.5*    PT/INR:  Lab Results  Component Value Date   INR 1.3 (H) 04/04/2019   INR 1.0 04/03/2019   INR 1.0 12/09/2016   ABG:  INR: Will add last result for INR, ABG once components are confirmed Will add last 4 CBG results once components are confirmed  Assessment/Plan:  1. CV - SB at times with HR in the high 50's. SR with HR in the 60's this am. On Amiodarone 200 mg bid, Amlodipine 10 mg daily, Clonidine patch, Hydralazine 25 mg tid, and Lopressor 50 mg tid. Will decrease Lopressor to 50 mg bid as not receiving tid  secondary to HR. 2.  Pulmonary - On room air this am. Encourage incentive spirometer 3. Acute on chronic CKD (stage IV)-Creatinine yesterday 4.19. Once leukocytosis resolved, will need TDC vs left AV fistula. Per vascular surgery, to be scheduled for am 4.  Anemia of chronic disease, surgery-H and H this am stable at 9.6 and 29.9 5. Leukocytosis-WBC yesterday decreased to 13,500.  ? Etiology. UA showed trace leukocytes and rare bacteria, no sign of wound infection, no PNA noted on last CXR. On empiric Zosyn  Donielle M ZimmermanPA-C 04/19/2019,7:03 AM   Agree with assessment and plan.  Linnie Delgrande Z. Orvan Seen, [place]

## 2019-04-20 ENCOUNTER — Encounter (HOSPITAL_COMMUNITY)
Admission: EM | Disposition: A | Discharge status: Home / Self Care | Payer: Self-pay | Source: Home / Self Care | Attending: Cardiothoracic Surgery

## 2019-04-20 ENCOUNTER — Inpatient Hospital Stay (HOSPITAL_COMMUNITY): Payer: Medicare Other

## 2019-04-20 ENCOUNTER — Encounter (HOSPITAL_COMMUNITY): Payer: Self-pay | Admitting: *Deleted

## 2019-04-20 ENCOUNTER — Inpatient Hospital Stay (HOSPITAL_COMMUNITY): Payer: Medicare Other | Admitting: Critical Care Medicine

## 2019-04-20 DIAGNOSIS — N184 Chronic kidney disease, stage 4 (severe): Secondary | ICD-10-CM

## 2019-04-20 HISTORY — PX: AV FISTULA PLACEMENT: SHX1204

## 2019-04-20 HISTORY — PX: INSERTION OF DIALYSIS CATHETER: SHX1324

## 2019-04-20 LAB — BASIC METABOLIC PANEL
Anion gap: 13 (ref 5–15)
BUN: 41 mg/dL — ABNORMAL HIGH (ref 8–23)
CO2: 25 mmol/L (ref 22–32)
Calcium: 8.8 mg/dL — ABNORMAL LOW (ref 8.9–10.3)
Chloride: 104 mmol/L (ref 98–111)
Creatinine, Ser: 5 mg/dL — ABNORMAL HIGH (ref 0.44–1.00)
GFR calc Af Amer: 10 mL/min — ABNORMAL LOW (ref >60)
GFR calc non Af Amer: 8 mL/min — ABNORMAL LOW (ref >60)
Glucose, Bld: 120 mg/dL — ABNORMAL HIGH (ref 70–99)
Potassium: 4.1 mmol/L (ref 3.5–5.1)
Sodium: 142 mmol/L (ref 135–145)

## 2019-04-20 LAB — GLUCOSE, CAPILLARY: Glucose-Capillary: 115 mg/dL — ABNORMAL HIGH (ref 70–99)

## 2019-04-20 LAB — PROTIME-INR
INR: 1.2 (ref 0.8–1.2)
Prothrombin Time: 15.1 seconds (ref 11.4–15.2)

## 2019-04-20 LAB — CBC
HCT: 27 % — ABNORMAL LOW (ref 36.0–46.0)
Hemoglobin: 8.5 g/dL — ABNORMAL LOW (ref 12.0–15.0)
MCH: 29.3 pg (ref 26.0–34.0)
MCHC: 31.5 g/dL (ref 30.0–36.0)
MCV: 93.1 fL (ref 80.0–100.0)
Platelets: 535 10*3/uL — ABNORMAL HIGH (ref 150–400)
RBC: 2.9 MIL/uL — ABNORMAL LOW (ref 3.87–5.11)
RDW: 15.4 % (ref 11.5–15.5)
WBC: 13 10*3/uL — ABNORMAL HIGH (ref 4.0–10.5)
nRBC: 0 % (ref 0.0–0.2)

## 2019-04-20 SURGERY — ARTERIOVENOUS (AV) FISTULA CREATION
Anesthesia: Monitor Anesthesia Care | Site: Neck | Laterality: Right

## 2019-04-20 MED ORDER — FENTANYL CITRATE (PF) 250 MCG/5ML IJ SOLN
INTRAMUSCULAR | Status: AC
  Filled 2019-04-20: qty 5

## 2019-04-20 MED ORDER — HEPARIN SODIUM (PORCINE) 1000 UNIT/ML IJ SOLN
INTRAMUSCULAR | Status: AC
  Filled 2019-04-20: qty 1

## 2019-04-20 MED ORDER — HEPARIN SODIUM (PORCINE) 1000 UNIT/ML IJ SOLN
INTRAMUSCULAR | Status: DC | PRN
  Administered 2019-04-20: 3000 [IU] via INTRAVENOUS

## 2019-04-20 MED ORDER — LIDOCAINE-EPINEPHRINE 1 %-1:100000 IJ SOLN
INTRAMUSCULAR | Status: DC | PRN
  Administered 2019-04-20: 20 mL via INTRADERMAL

## 2019-04-20 MED ORDER — SODIUM CHLORIDE 0.9 % IV SOLN
INTRAVENOUS | Status: DC | PRN
  Administered 2019-04-20: 500 mL

## 2019-04-20 MED ORDER — MIDAZOLAM HCL 2 MG/2ML IJ SOLN
INTRAMUSCULAR | Status: AC
  Filled 2019-04-20: qty 2

## 2019-04-20 MED ORDER — PIPERACILLIN-TAZOBACTAM 3.375 G IVPB
3.3750 g | Freq: Two times a day (BID) | INTRAVENOUS | Status: DC
  Administered 2019-04-20 – 2019-04-21 (×2): 3.375 g via INTRAVENOUS
  Filled 2019-04-20 (×2): qty 50

## 2019-04-20 MED ORDER — SODIUM CHLORIDE 0.9 % IV SOLN
INTRAVENOUS | Status: DC
  Administered 2019-04-20: 14:00:00 via INTRAVENOUS

## 2019-04-20 MED ORDER — 0.9 % SODIUM CHLORIDE (POUR BTL) OPTIME
TOPICAL | Status: DC | PRN
  Administered 2019-04-20: 1000 mL

## 2019-04-20 MED ORDER — SODIUM CHLORIDE 0.9 % IV SOLN
INTRAVENOUS | Status: AC
  Filled 2019-04-20: qty 1.2

## 2019-04-20 MED ORDER — PROPOFOL 10 MG/ML IV BOLUS
INTRAVENOUS | Status: DC | PRN
  Administered 2019-04-20 (×4): 10 mg via INTRAVENOUS
  Administered 2019-04-20: 11 mg via INTRAVENOUS

## 2019-04-20 MED ORDER — MIDAZOLAM HCL 5 MG/5ML IJ SOLN
INTRAMUSCULAR | Status: DC | PRN
  Administered 2019-04-20: 2 mg via INTRAVENOUS

## 2019-04-20 MED ORDER — LIDOCAINE-EPINEPHRINE 1 %-1:100000 IJ SOLN
INTRAMUSCULAR | Status: AC
  Filled 2019-04-20: qty 2

## 2019-04-20 MED ORDER — PROPOFOL 1000 MG/100ML IV EMUL
INTRAVENOUS | Status: AC
  Filled 2019-04-20: qty 100

## 2019-04-20 MED ORDER — LIDOCAINE 2% (20 MG/ML) 5 ML SYRINGE
INTRAMUSCULAR | Status: DC | PRN
  Administered 2019-04-20: 60 mg via INTRAVENOUS

## 2019-04-20 MED ORDER — PHENYLEPHRINE 40 MCG/ML (10ML) SYRINGE FOR IV PUSH (FOR BLOOD PRESSURE SUPPORT)
PREFILLED_SYRINGE | INTRAVENOUS | Status: AC
  Filled 2019-04-20: qty 20

## 2019-04-20 MED ORDER — FENTANYL CITRATE (PF) 250 MCG/5ML IJ SOLN
INTRAMUSCULAR | Status: DC | PRN
  Administered 2019-04-20 (×2): 25 ug via INTRAVENOUS

## 2019-04-20 MED ORDER — PROPOFOL 500 MG/50ML IV EMUL
INTRAVENOUS | Status: DC | PRN
  Administered 2019-04-20: 50 ug/kg/min via INTRAVENOUS

## 2019-04-20 MED ORDER — PHENYLEPHRINE HCL-NACL 10-0.9 MG/250ML-% IV SOLN
INTRAVENOUS | Status: DC | PRN
  Administered 2019-04-20: 25 ug/min via INTRAVENOUS

## 2019-04-20 SURGICAL SUPPLY — 52 items
ADH SKN CLS APL DERMABOND .7 (GAUZE/BANDAGES/DRESSINGS) ×4
ARMBAND PINK RESTRICT EXTREMIT (MISCELLANEOUS) ×4 IMPLANT
BAG DECANTER FOR FLEXI CONT (MISCELLANEOUS) ×4 IMPLANT
BIOPATCH RED 1 DISK 7.0 (GAUZE/BANDAGES/DRESSINGS) ×3 IMPLANT
BIOPATCH RED 1IN DISK 7.0MM (GAUZE/BANDAGES/DRESSINGS) ×1
CANISTER SUCT 3000ML PPV (MISCELLANEOUS) ×4 IMPLANT
CATH PALINDROME RT-P 15FX19CM (CATHETERS) ×2 IMPLANT
CATH PALINDROME RT-P 15FX23CM (CATHETERS) IMPLANT
CATH PALINDROME RT-P 15FX28CM (CATHETERS) IMPLANT
CATH PALINDROME RT-P 15FX55CM (CATHETERS) IMPLANT
CLIP VESOCCLUDE MED 6/CT (CLIP) ×4 IMPLANT
CLIP VESOCCLUDE SM WIDE 6/CT (CLIP) ×4 IMPLANT
COVER PROBE W GEL 5X96 (DRAPES) ×4 IMPLANT
COVER SURGICAL LIGHT HANDLE (MISCELLANEOUS) ×4 IMPLANT
COVER WAND RF STERILE (DRAPES) ×4 IMPLANT
DERMABOND ADVANCED (GAUZE/BANDAGES/DRESSINGS) ×4
DERMABOND ADVANCED .7 DNX12 (GAUZE/BANDAGES/DRESSINGS) ×2 IMPLANT
DRAPE C-ARM 42X72 X-RAY (DRAPES) ×4 IMPLANT
DRAPE CHEST BREAST 15X10 FENES (DRAPES) ×4 IMPLANT
ELECT REM PT RETURN 9FT ADLT (ELECTROSURGICAL) ×4
ELECTRODE REM PT RTRN 9FT ADLT (ELECTROSURGICAL) ×2 IMPLANT
GAUZE 4X4 16PLY RFD (DISPOSABLE) ×4 IMPLANT
GLOVE BIO SURGEON STRL SZ7.5 (GLOVE) ×4 IMPLANT
GOWN STRL REUS W/ TWL LRG LVL3 (GOWN DISPOSABLE) ×4 IMPLANT
GOWN STRL REUS W/ TWL XL LVL3 (GOWN DISPOSABLE) ×2 IMPLANT
GOWN STRL REUS W/TWL LRG LVL3 (GOWN DISPOSABLE) ×8
GOWN STRL REUS W/TWL XL LVL3 (GOWN DISPOSABLE) ×4
INSERT FOGARTY SM (MISCELLANEOUS) IMPLANT
KIT BASIN OR (CUSTOM PROCEDURE TRAY) ×4 IMPLANT
KIT TURNOVER KIT B (KITS) ×4 IMPLANT
NDL 18GX1X1/2 (RX/OR ONLY) (NEEDLE) ×2 IMPLANT
NDL HYPO 25GX1X1/2 BEV (NEEDLE) ×2 IMPLANT
NEEDLE 18GX1X1/2 (RX/OR ONLY) (NEEDLE) ×4 IMPLANT
NEEDLE HYPO 25GX1X1/2 BEV (NEEDLE) ×4 IMPLANT
NS IRRIG 1000ML POUR BTL (IV SOLUTION) ×4 IMPLANT
PACK CV ACCESS (CUSTOM PROCEDURE TRAY) ×4 IMPLANT
PACK SURGICAL SETUP 50X90 (CUSTOM PROCEDURE TRAY) ×4 IMPLANT
PAD ARMBOARD 7.5X6 YLW CONV (MISCELLANEOUS) ×8 IMPLANT
SOAP 2 % CHG 4 OZ (WOUND CARE) ×4 IMPLANT
SUT ETHILON 3 0 PS 1 (SUTURE) ×4 IMPLANT
SUT MNCRL AB 4-0 PS2 18 (SUTURE) ×6 IMPLANT
SUT PROLENE 6 0 BV (SUTURE) ×8 IMPLANT
SUT VIC AB 3-0 SH 27 (SUTURE) ×8
SUT VIC AB 3-0 SH 27X BRD (SUTURE) ×2 IMPLANT
SYR 10ML LL (SYRINGE) ×4 IMPLANT
SYR 20ML LL LF (SYRINGE) ×8 IMPLANT
SYR 5ML LL (SYRINGE) ×4 IMPLANT
SYR CONTROL 10ML LL (SYRINGE) ×4 IMPLANT
TOWEL GREEN STERILE (TOWEL DISPOSABLE) ×4 IMPLANT
TOWEL GREEN STERILE FF (TOWEL DISPOSABLE) ×8 IMPLANT
UNDERPAD 30X30 (UNDERPADS AND DIAPERS) ×4 IMPLANT
WATER STERILE IRR 1000ML POUR (IV SOLUTION) ×4 IMPLANT

## 2019-04-20 NOTE — Transfer of Care (Signed)
Immediate Anesthesia Transfer of Care Note  Patient: Monique Henderson  Procedure(s) Performed: ARTERIOVENOUS (AV) FISTULA CREATION (Left Arm Upper) INSERTION OF DIALYSIS CATHETER, right internal jugular (Right Neck)  Patient Location: PACU  Anesthesia Type:MAC  Level of Consciousness: awake, alert  and oriented  Airway & Oxygen Therapy: Patient Spontanous Breathing and Patient connected to nasal cannula oxygen  Post-op Assessment: Report given to RN and Post -op Vital signs reviewed and stable  Post vital signs: Reviewed and stable  Last Vitals:  Vitals Value Taken Time  BP 142/74 04/20/19 1619  Temp    Pulse 65 04/20/19 1620  Resp 23 04/20/19 1620  SpO2 91 % 04/20/19 1620  Vitals shown include unvalidated device data.  Last Pain:  Vitals:   04/20/19 1116  TempSrc: Oral  PainSc:       Patients Stated Pain Goal: 0 (82/99/37 1696)  Complications: No apparent anesthesia complications

## 2019-04-20 NOTE — Care Management Important Message (Signed)
Important Message  Patient Details  Name: Monique Henderson MRN: [id number] Date of Birth: Sep 09, 1950   Medicare Important Message Given:  Yes     [name] 04/20/2019, 2:15 PM

## 2019-04-20 NOTE — Op Note (Signed)
° ° °  Patient name: Monique Henderson MRN: [id number] DOB: 12-Jun-1950 Sex: female  04/20/2019 Pre-operative Diagnosis: End-stage renal disease Post-operative diagnosis:  Same Surgeon:  Erlene Quan C. Donzetta Matters, MD Assistant: Laurence Slate, PA Procedure Performed: 1.  Ultrasound-guided placement of 19 cm right IJ tunneled dialysis catheter 2.  Left arm brachial artery to cephalic vein AV fistula creation  Indications: 68 year old female admitted with aortic dissection now status post repair.  She now has end-stage renal disease.  She is currently dialyzing via temporary catheter.  She is indicated for tunneled catheter and fistula versus graft in her nondominant left upper extremity.  Findings: IJ was patent.  Tunneled catheter placed to the SVC atrial junction.  Left upper extremity cephalic and basilic veins were similarly sized.  Cephalic vein was diseased at the antecubitum.  Just above the antecubitum this was easily dilated to 3-1/2 mm.  At completion there was a good thrill in the cephalic vein was a palpable radial pulse the wrist.   Procedure:  The patient was identified in the holding area and taken to the operating room where she was placed supine operative MAC anesthesia induced.  She was sterilely prepped draped the bilateral neck and left upper extremity usual fashion.  Antibiotics were minister and timeout was called.  Ultrasound was used to identify the right IJ which was noted be patent and compressible.  This was cannulated with 18-gauge needle.  Wire was passed centrally.  19 cm catheter was tunneled via a counterincision and trimmed to size and assembled.  Wire tract was serially dilated introducer sheath was placed under fluoroscopic guidance.  The catheter was placed in the SVC atrial junction.  Was flushed with heparinized saline.  Was affixed to the skin with 3-0 nylon suture.  Neck incision closed with 4 Monocryl and Dermabond placed to both sides.  Sterile dressing was applied.   Catheter was locked with 1.5 cc of heparin and either port.  Attention was turned to the left upper extremity.  Ultrasound was used to identify the cephalic and basilic veins.  They were similar size.  Cephalic did appear to have scarring at the level of the antecubitum.  Incision was made just at the antecubitum.  We dissected down to the vein.  Externally it appeared normal.  The artery was then dissected free which was free of disease and Vesseloops placed around it.  Vein was transected tied off distally.  Was more for orientation.  I serially dilated it did tear after approximate 1/2 cm.  I mobilized it further up the arm.  I was able to get enough to stretch of the brachial artery.  This dilated 3-1/2 mm.  Was flushed heparinized saline.  The arteries clamped distally proximally over longitudinally flushed heparinized saline by directions.  The vein was evidence of a 6-0 Prolene suture.  Prior completion anastomosis allowed flushing all directions.  Upon completion there was good thrill in the vein.  There is palpable radial pulse the wrist also confirmed with Doppler.  Satisfied with this we irrigated wound hemostasis closed in layers Vicryl Monocryl.  Dermabond placed to level the skin.  She was awakened anesthesia having tolerated procedure well immediate complication.  All counts were correct at completion.  EBL: 25 cc   Ygnacio Fecteau C. Donzetta Matters, MD [facility] [place] Office: [phone] Pager: [phone]

## 2019-04-20 NOTE — Progress Notes (Addendum)
      [address]       [place] [address]             [phone]        16 Days Post-Op Procedure(s) (LRB): THORACIC ASCENDING ANEURYSM REPAIR (AAA)  USING 28 MM X 30 CM HEMASHIELD PLATINUM VASCULAR GRAFT (N/A) Transesophageal Echocardiogram (Tee) (N/A)  Subjective: Patient sleeping this am.  Objective: Vital signs in last 24 hours: Temp:  [98.5 F (36.9 C)-99.6 F (37.6 C)] 99.6 F (37.6 C) (11/19 0403) Pulse Rate:  [57-63] 61 (11/19 0403) Cardiac Rhythm: Normal sinus rhythm (11/19 0007) Resp:  [10-22] 18 (11/19 0403) BP: (92-134)/(49-77) 131/66 (11/19 0403) SpO2:  [95 %-100 %] 99 % (11/19 0403) Weight:  [52.5 kg] 52.5 kg (11/19 0403)  Pre op weight 58.9 kg Current Weight  04/20/19 52.5 kg       Intake/Output from previous day: No intake/output data recorded.   Physical Exam:  Cardiovascular: RRR Pulmonary: Slightly diminished bibasilar breath sounds Abdomen: Soft, non tender, bowel sounds present. Extremities: No lower extremity edema. Wounds: Clean and dry.  No erythema or signs of infection.  Lab Results: CBC: Recent Labs    04/19/19 0500 04/20/19 0529  WBC 13.5* 13.0*  HGB 9.6* 8.5*  HCT 29.9* 27.0*  PLT 474* 535*   BMET:  Recent Labs    04/19/19 0500 04/20/19 0529  NA 140 142  K 3.4* 4.1  CL 103 104  CO2 25 25  GLUCOSE 113* 120*  BUN 35* 41*  CREATININE 4.19* 5.00*  CALCIUM 8.8* 8.8*    PT/INR:  Lab Results  Component Value Date   INR 1.2 04/20/2019   INR 1.3 (H) 04/04/2019   INR 1.0 04/03/2019   ABG:  INR: Will add last result for INR, ABG once components are confirmed Will add last 4 CBG results once components are confirmed  Assessment/Plan:  1. CV -  SR with HR in the high 50's this am. On Amiodarone 200 mg bid, Amlodipine 10 mg daily, Clonidine patch, Hydralazine 25 mg tid, and Lopressor 50 mg bid.  2.  Pulmonary - On room air this am. Encourage incentive spirometer 3. Acute on chronic CKD (stage  IV)-Creatinine this am 5. Once leukocytosis resolved, will need TDC and left AV fistula. Per vascular surgery, scheduled for today 4.  Anemia of chronic disease, surgery-H and H this am stable at 8.5 and 27. 5. Leukocytosis-WBC slightly decreased to 13,000. Low grade fever to 99.6. She has a catheter left subclavian ? Etiology. UA showed trace leukocytes and rare bacteria, no sign of wound infection, no PNA noted on last CXR. On empiric Zosyn and will stop soon   M ZimmermanPA-C 04/20/2019,7:03 AM

## 2019-04-20 NOTE — Progress Notes (Signed)
  Progress Note    04/20/2019 1:43 PM Day of Surgery  Subjective:  No overnight issues  Vitals:   04/20/19 0908 04/20/19 1116  BP: 128/73 134/85  Pulse: 63 62  Resp:  19  Temp:  98.5 F (36.9 C)  SpO2:  99%    Physical Exam: aaox3 Palpable left radial pulse  CBC    Component Value Date/Time   WBC 13.0 (H) 04/20/2019 0529   RBC 2.90 (L) 04/20/2019 0529   HGB 8.5 (L) 04/20/2019 0529   HGB 11.7 02/24/2019 1244   HGB 13.1 09/28/2012 0831   HCT 27.0 (L) 04/20/2019 0529   HCT 36.1 02/24/2019 1244   HCT 40.1 09/28/2012 0831   PLT 535 (H) 04/20/2019 0529   PLT 365 02/24/2019 1244   MCV 93.1 04/20/2019 0529   MCV 88 02/24/2019 1244   MCV 90.7 09/28/2012 0831   MCH 29.3 04/20/2019 0529   MCHC 31.5 04/20/2019 0529   RDW 15.4 04/20/2019 0529   RDW 13.9 02/24/2019 1244   RDW 14.2 09/28/2012 0831   LYMPHSABS 1.1 04/15/2019 1357   LYMPHSABS 1.7 05/04/2018 0922   LYMPHSABS 1.8 09/28/2012 0831   MONOABS 2.2 (H) 04/15/2019 1357   MONOABS 0.5 09/28/2012 0831   EOSABS 0.3 04/15/2019 1357   EOSABS 0.3 05/04/2018 0922   BASOSABS 0.0 04/15/2019 1357   BASOSABS 0.0 05/04/2018 0922   BASOSABS 0.0 09/28/2012 0831    BMET    Component Value Date/Time   NA 142 04/20/2019 0529   NA 143 02/24/2019 1244   NA 142 09/28/2012 0831   K 4.1 04/20/2019 0529   K 4.2 09/28/2012 0831   CL 104 04/20/2019 0529   CL 103 09/28/2012 0831   CO2 25 04/20/2019 0529   CO2 30 (H) 09/28/2012 0831   GLUCOSE 120 (H) 04/20/2019 0529   GLUCOSE 103 (H) 09/28/2012 0831   BUN 41 (H) 04/20/2019 0529   BUN 31 (H) 02/24/2019 1244   BUN 18.3 09/28/2012 0831   CREATININE 5.00 (H) 04/20/2019 0529   CREATININE 2.09 (H) 01/18/2017 1145   CREATININE 1.2 (H) 09/28/2012 0831   CALCIUM 8.8 (L) 04/20/2019 0529   CALCIUM 9.4 09/28/2012 0831   GFRNONAA 8 (L) 04/20/2019 0529   GFRNONAA 23 (L) 12/09/2016 1458   GFRAA 10 (L) 04/20/2019 0529   GFRAA 26 (L) 12/09/2016 1458    INR    Component Value  Date/Time   INR 1.2 04/20/2019 0529     Intake/Output Summary (Last 24 hours) at 04/20/2019 1343 Last data filed at 04/20/2019 0800 Gross per 24 hour  Intake 0 ml  Output -  Net 0 ml     Assessment/plan:  68 y.o. female is here for aortic dissection s/p repair. Plan for left arm avf and tdc   Ollis Daudelin C. Donzetta Matters, MD [facility] [place] Office: [phone] Pager: [phone]  04/20/2019 1:43 PM

## 2019-04-20 NOTE — Progress Notes (Signed)
CARDIAC REHAB PHASE I   PRE:  Rate/Rhythm: 54 SR  BP:  Sitting: 120/64      SaO2: 97 RA  MODE:  Ambulation: 450 ft   POST:  Rate/Rhythm: 83 SR  BP:  Sitting: 155/73    SaO2: 96 RA  After some convincing, pt ambulated 43ft in hallway independently with front wheel walker. Throughout the walk pt denies SOB. Pt sats 96 on RA. Pt returned to room, given warm blanket. Pt then c/o SOB, sats 98 RA. Pt admits to anxiety about upcoming surgery. Coached pt through purse lipped breathing. Provided support and encouragement. Will continue to follow.  [id number] Rufina Falco, RN BSN 04/20/2019 10:51 AM

## 2019-04-20 NOTE — Progress Notes (Signed)
Pt arrived from PACU. Pt c/z/ox4. Vitals stable. Pt denies any complaints. Call bell within reach. Will continue to monitor. Jerald Kief, RN

## 2019-04-20 NOTE — Anesthesia Preprocedure Evaluation (Addendum)
Anesthesia Evaluation  Patient identified by MRN, date of birth, ID band Patient awake    Reviewed: Allergy & Precautions, NPO status , Patient's Chart, lab work & pertinent test results, reviewed documented beta blocker date and time   Airway Mallampati: I  TM Distance: >3 FB Neck ROM: Full    Dental  (+) Edentulous Upper, Edentulous Lower   Pulmonary shortness of breath, COPD,  COPD inhaler, Current Smoker,    breath sounds clear to auscultation       Cardiovascular hypertension, Pt. on medications and Pt. on home beta blockers + Peripheral Vascular Disease   Rhythm:Regular  S/p repair of aortic dissection.    Neuro/Psych negative neurological ROS  negative psych ROS   GI/Hepatic GERD  Controlled,(+) Hepatitis -, C  Endo/Other  negative endocrine ROS  Renal/GU CRFRenal disease     Musculoskeletal   Abdominal   Peds  Hematology  (+) Blood dyscrasia, anemia ,   Anesthesia Other Findings   Reproductive/Obstetrics                            Anesthesia Physical Anesthesia Plan  ASA: III  Anesthesia Plan: MAC   Post-op Pain Management:    Induction: Intravenous  PONV Risk Score and Plan: 1 and Treatment may vary due to age or medical condition and Propofol infusion  Airway Management Planned: Nasal Cannula  Additional Equipment: None  Intra-op Plan:   Post-operative Plan:   Informed Consent: I have reviewed the patients History and Physical, chart, labs and discussed the procedure including the risks, benefits and alternatives for the proposed anesthesia with the patient or authorized representative who has indicated his/her understanding and acceptance.     Dental advisory given  Plan Discussed with: CRNA and Surgeon  Anesthesia Plan Comments:         Anesthesia Quick Evaluation

## 2019-04-20 NOTE — Anesthesia Postprocedure Evaluation (Signed)
Anesthesia Post Note  Patient: Monique Henderson  Procedure(s) Performed: ARTERIOVENOUS (AV) FISTULA CREATION (Left Arm Upper) INSERTION OF DIALYSIS CATHETER, right internal jugular (Right Neck)     Patient location during evaluation: PACU Anesthesia Type: MAC Level of consciousness: awake and alert Pain management: pain level controlled Vital Signs Assessment: post-procedure vital signs reviewed and stable Respiratory status: spontaneous breathing, nonlabored ventilation and respiratory function stable Cardiovascular status: stable and blood pressure returned to baseline Postop Assessment: no apparent nausea or vomiting Anesthetic complications: no    Last Vitals:  Vitals:   04/20/19 1620 04/20/19 1700  BP: (!) 142/74 136/67  Pulse: 66 64  Resp: (!) 21 12  Temp: 36.5 C 36.8 C  SpO2:  93%    Last Pain:  Vitals:   04/20/19 1700  TempSrc: Oral  PainSc: 0-No pain                 Josha Weekley,W. EDMOND

## 2019-04-20 NOTE — Anesthesia Procedure Notes (Signed)
Procedure Name: MAC Date/Time: 04/20/2019 2:32 PM Performed by: Mariea Clonts, CRNA Pre-anesthesia Checklist: Patient identified, Emergency Drugs available, Suction available, Patient being monitored and Timeout performed Patient Re-evaluated:Patient Re-evaluated prior to induction Oxygen Delivery Method: Simple face mask and Nasal cannula

## 2019-04-20 NOTE — Progress Notes (Signed)
  [facility] Progress Note    Assessment/ Plan:   #AoCKD4, right now dialyzing as an AKI, but very possible will not recover sufficient GFR to come off dialysis.   clip complete as AKI- TTS at [facility].  Leukocytosis downtrending, TDC and AVF placement in OR with VVS scheduled 11/19--> L Regino Ramirez catheter stopped working 11/19 so will dialyze after OR today.   #Hypertension: Variable,  Cont UF.   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  Per CT surgery.  #Anemia, acute blood loss: Monitor hemoglobin.  Transfuse as needed. Start darbepoetin 60 Q thurs  #Mood- seems depressed- monitor  Subjective:    Going to take a walk.  For [facility] and AVF today, then HD.   Objective:   BP 134/85 (BP Location: Right Arm)   Pulse 62   Temp 98.5 F (36.9 C) (Oral)   Resp 19   Ht 5\' 3"  (1.6 m)   Wt 52.5 kg Comment: scale A  SpO2 99%   BMI 20.50 kg/m   Intake/Output Summary (Last 24 hours) at 04/20/2019 1119 Last data filed at 04/20/2019 0800 Gross per 24 hour  Intake 0 ml  Output -  Net 0 ml   Weight change: -1.4 kg  Physical Exam: Gen: earful on HD CVS: RRR healing sternal scar and R [place] incision Resp: clear bilaterally no c/w/r Abd: soft nontender NABS Ext: no LE edema ACCESS: L [place] nontunneled HD cath  Imaging: No results found.  Labs: BMET Recent Labs  Lab 04/15/19 1357 04/16/19 0612 04/17/19 0500 04/19/19 0500 04/20/19 0529  NA 140 139 137 140 142  K 3.5 3.9 3.5 3.4* 4.1  CL 100 99 97* 103 104  CO2 24 26 26 25 25   GLUCOSE 121* 122* 128* 113* 120*  BUN 43* 33* 47* 35* 41*  CREATININE 3.36* 3.57* 4.94* 4.19* 5.00*  CALCIUM 8.8* 8.8* 8.5* 8.8* 8.8*  PHOS 3.9  --   --   --   --    CBC Recent Labs  Lab 04/15/19 1357 04/16/19 0612 04/17/19 0500 04/19/19 0500 04/20/19 0529  WBC 19.3* 20.6* 17.5* 13.5* 13.0*  NEUTROABS 15.5*  --   --   --   --   HGB 9.5* 9.7* 9.0*  9.6* 8.5*  HCT 30.1* 30.5* 28.1* 29.9* 27.0*  MCV 93.5 94.1 94.0 92.6 93.1  PLT 394 373 425* 474* 535*    Medications:    . amiodarone  200 mg Oral BID  . amLODipine  10 mg Oral Daily  . aspirin  324 mg Oral Daily  . Chlorhexidine Gluconate Cloth  6 each Topical Daily  . Chlorhexidine Gluconate Cloth  6 each Topical Q0600  . cloNIDine  0.3 mg Transdermal Weekly  . darbepoetin (ARANESP) injection - DIALYSIS  60 mcg Intravenous Q Thu-HD  . docusate sodium  100 mg Oral Daily  . feeding supplement (NEPRO CARB STEADY)  237 mL Oral BID BM  . hydrALAZINE  25 mg Oral Q8H  . mouth rinse  15 mL Mouth Rinse BID  . metoprolol tartrate  50 mg Oral BID  . multivitamin  1 tablet Oral QHS  . pantoprazole  40 mg Oral Daily  . senna-docusate  2 tablet Oral BID  . sodium chloride flush  3 mL Intravenous Once      Madelon Lips, MD 04/20/2019, 11:19 AM

## 2019-04-20 NOTE — Progress Notes (Signed)
Renal Navigator met with patient to obtain signature on SCAT application Part B and submitted to Eligibility Coordinator. Patient has already submitted Part A prior to hospitalization. Per conversation with patient's sister yesterday, she will be able to take patient to OP HD on Saturday if patient is out of the hospital for this appointment, so patient will need SCAT to start on Tuesday of next week. Navigator informed Engineer, civil (consulting).  Renal Navigator spoke with Attending/Dr. Orvan Seen to discuss patient's POC and discharge readiness. He states patient should be ready for discharge tomorrow, Friday, 04/21/19, pending HD access surgery today with HD following. Renal Navigator will continue to follow to assist with smooth transition from hospital to OP HD clinic.  Alphonzo Cruise, [place] Renal Navigator [phone]

## 2019-04-20 NOTE — Progress Notes (Signed)
PHARMACY NOTE:  ANTIMICROBIAL RENAL DOSAGE ADJUSTMENT  Current antimicrobial regimen includes a mismatch between antimicrobial dosage and estimated renal function.  As per policy approved by the Pharmacy & Therapeutics and Medical Executive Committees, the antimicrobial dosage will be adjusted accordingly.  Current antimicrobial dosage:  Zosyn 2.275gm IV q8h  Indication: PNA  Renal Function:  Estimated Creatinine Clearance: 9.2 mL/min (A) (by C-G formula based on SCr of 5 mg/dL (H)). [x]      On intermittent HD, scheduled: []      On CRRT    Antimicrobial dosage has been changed to:  Zosyn 3.375gm IV q12h (extended infusion)  Additional comments:   Thank you for allowing pharmacy to be a part of this patient's care.  Dareen Piano, [facility] 04/20/2019 1:12 PM

## 2019-04-21 ENCOUNTER — Encounter (HOSPITAL_COMMUNITY): Payer: Self-pay | Admitting: Vascular Surgery

## 2019-04-21 LAB — CBC WITH DIFFERENTIAL/PLATELET
Abs Immature Granulocytes: 0.06 10*3/uL (ref 0.00–0.07)
Basophils Absolute: 0.1 10*3/uL (ref 0.0–0.1)
Basophils Relative: 1 %
Eosinophils Absolute: 0.3 10*3/uL (ref 0.0–0.5)
Eosinophils Relative: 3 %
HCT: 27.6 % — ABNORMAL LOW (ref 36.0–46.0)
Hemoglobin: 8.7 g/dL — ABNORMAL LOW (ref 12.0–15.0)
Immature Granulocytes: 1 %
Lymphocytes Relative: 8 %
Lymphs Abs: 1 10*3/uL (ref 0.7–4.0)
MCH: 29.5 pg (ref 26.0–34.0)
MCHC: 31.5 g/dL (ref 30.0–36.0)
MCV: 93.6 fL (ref 80.0–100.0)
Monocytes Absolute: 0.4 10*3/uL (ref 0.1–1.0)
Monocytes Relative: 3 %
Neutro Abs: 10.3 10*3/uL — ABNORMAL HIGH (ref 1.7–7.7)
Neutrophils Relative %: 84 %
Platelets: 564 10*3/uL — ABNORMAL HIGH (ref 150–400)
RBC: 2.95 MIL/uL — ABNORMAL LOW (ref 3.87–5.11)
RDW: 15.4 % (ref 11.5–15.5)
WBC: 12.1 10*3/uL — ABNORMAL HIGH (ref 4.0–10.5)
nRBC: 0 % (ref 0.0–0.2)

## 2019-04-21 LAB — CULTURE, BLOOD (ROUTINE X 2)
Culture: NO GROWTH
Culture: NO GROWTH
Special Requests: ADEQUATE
Special Requests: ADEQUATE

## 2019-04-21 LAB — RENAL FUNCTION PANEL
Albumin: 2.5 g/dL — ABNORMAL LOW (ref 3.5–5.0)
Anion gap: 11 (ref 5–15)
BUN: 27 mg/dL — ABNORMAL HIGH (ref 8–23)
CO2: 25 mmol/L (ref 22–32)
Calcium: 8.3 mg/dL — ABNORMAL LOW (ref 8.9–10.3)
Chloride: 99 mmol/L (ref 98–111)
Creatinine, Ser: 3.55 mg/dL — ABNORMAL HIGH (ref 0.44–1.00)
GFR calc Af Amer: 15 mL/min — ABNORMAL LOW (ref >60)
GFR calc non Af Amer: 13 mL/min — ABNORMAL LOW (ref >60)
Glucose, Bld: 112 mg/dL — ABNORMAL HIGH (ref 70–99)
Phosphorus: 4.2 mg/dL (ref 2.5–4.6)
Potassium: 4.1 mmol/L (ref 3.5–5.1)
Sodium: 135 mmol/L (ref 135–145)

## 2019-04-21 LAB — GLUCOSE, CAPILLARY
Glucose-Capillary: 113 mg/dL — ABNORMAL HIGH (ref 70–99)
Glucose-Capillary: 138 mg/dL — ABNORMAL HIGH (ref 70–99)

## 2019-04-21 MED ORDER — OXYCODONE HCL 5 MG PO TABS: 5.0000 mg | ORAL_TABLET | ORAL | 0 refills | Status: DC | PRN

## 2019-04-21 MED ORDER — AMIODARONE HCL 200 MG PO TABS
200.0000 mg | ORAL_TABLET | Freq: Every day | ORAL | Status: DC
  Administered 2019-04-21 – 2019-04-23 (×3): 200 mg via ORAL
  Filled 2019-04-21 (×3): qty 1

## 2019-04-21 MED ORDER — ASPIRIN EC 325 MG PO TBEC: 325.0000 mg | DELAYED_RELEASE_TABLET | Freq: Every day | ORAL | 3 refills | Status: DC

## 2019-04-21 MED ORDER — ONDANSETRON HCL 4 MG/2ML IJ SOLN
INTRAMUSCULAR | Status: AC
  Filled 2019-04-21: qty 2

## 2019-04-21 MED ORDER — RENA-VITE PO TABS: 1.0000 | ORAL_TABLET | Freq: Every day | ORAL | 1 refills | Status: DC

## 2019-04-21 MED ORDER — HEPARIN SODIUM (PORCINE) 1000 UNIT/ML IJ SOLN
INTRAMUSCULAR | Status: AC
  Filled 2019-04-21: qty 3

## 2019-04-21 MED ORDER — AMIODARONE HCL 200 MG PO TABS: 200.0000 mg | ORAL_TABLET | Freq: Every day | ORAL | 0 refills | Status: DC

## 2019-04-21 MED ORDER — CLONIDINE 0.3 MG/24HR TD PTWK: 0.3000 mg | MEDICATED_PATCH | TRANSDERMAL | 2 refills | Status: DC

## 2019-04-21 MED ORDER — HYDRALAZINE HCL 25 MG PO TABS: 25.0000 mg | ORAL_TABLET | Freq: Three times a day (TID) | ORAL | 1 refills | Status: DC

## 2019-04-21 MED ORDER — METOPROLOL TARTRATE 50 MG PO TABS: 50.0000 mg | ORAL_TABLET | Freq: Two times a day (BID) | ORAL | 1 refills | Status: DC

## 2019-04-21 NOTE — Progress Notes (Addendum)
[address]       [place], [address]             [phone]        1 Day Post-Op Procedure(s) (LRB): ARTERIOVENOUS (AV) FISTULA CREATION (Left) INSERTION OF DIALYSIS CATHETER, right internal jugular (Right)  Subjective: Patient tearful this am. She states she did not know she has to go to HD this am. I explained the importance of having HD three times weekly. She has complaints of pain at left catheter site and left arm.  Objective: Vital signs in last 24 hours: Temp:  [97.7 F (36.5 C)-99.9 F (37.7 C)] 98.4 F (36.9 C) (11/20 0548) Pulse Rate:  [61-70] 63 (11/20 0548) Cardiac Rhythm: Normal sinus rhythm (11/19 2345) Resp:  [12-28] 19 (11/20 0548) BP: (126-142)/(67-85) 126/71 (11/20 0548) SpO2:  [91 %-100 %] 98 % (11/20 0548) Weight:  [52.5 kg-56.2 kg] 56.2 kg (11/20 0548)  Pre op weight 58.9 kg Current Weight  04/21/19 56.2 kg       Intake/Output from previous day: 11/19 0701 - 11/20 0700 In: 590 [P.O.:240; I.V.:250; IV Piggyback:100] Out: 200 [Urine:150; Blood:50]   Physical Exam:  Cardiovascular: RRR Pulmonary: Slightly diminished bibasilar breath sounds Abdomen: Soft, non tender, bowel sounds present. Extremities: No lower extremity edema. Wounds: Sternal and axillary wounds are clean and dry.  No erythema or signs of infection. Left catheter dressing is clean and dry. Left arm wound has derma bond at brachial area.  Lab Results: CBC: Recent Labs    04/19/19 0500 04/20/19 0529  WBC 13.5* 13.0*  HGB 9.6* 8.5*  HCT 29.9* 27.0*  PLT 474* 535*   BMET:  Recent Labs    04/19/19 0500 04/20/19 0529  NA 140 142  K 3.4* 4.1  CL 103 104  CO2 25 25  GLUCOSE 113* 120*  BUN 35* 41*  CREATININE 4.19* 5.00*  CALCIUM 8.8* 8.8*    Monique Henderson/INR:  Lab Results  Component Value Date   INR 1.2 04/20/2019   INR 1.3 (H) 04/04/2019   INR 1.0 04/03/2019   ABG:  INR: Will add last result for INR, ABG once components are confirmed Will  add last 4 CBG results once components are confirmed  Assessment/Plan:  1. CV -  SR with HR in the high 60's this am. On Amiodarone 200 mg bid, Amlodipine 10 mg daily, Clonidine patch, Hydralazine 25 mg tid, and Lopressor 50 mg bid. Will decrease Amiodarone to 200 mg daily. 2.  Pulmonary - On room air this am. According to nurse, she is put on oxygen because of her request, not because of desaturation or hypoxia. Encourage incentive spirometer 3. Acute on chronic CKD (stage IV)-S/p left IJ TDC and left arm brachial artery to cephalic vein AV fistula.  4.  Anemia of chronic disease, surgery-Last H and H  stable at 8.5 and 27. 5. Leukocytosis-WBC slightly decreased yesterday to 13,000. Low grade fever to 99.6. She has a catheter left subclavian ? Etiology. UA showed trace leukocytes and rare bacteria, no sign of wound infection, no PNA noted on last CXR. On empiric Zosyn and will stop after today's dose as has had for over a week 6. As discussed with Dr. Orvan Seen, hope to discharge in am as appears outpatient HD has been arranged  Donielle M ZimmermanPA-C 04/21/2019,7:06 AM   Monique Henderson seen and examined; she is agreeable to discharge and seems to understand the medical need for HD at this point. I explained that due  to underlying HTN, her renal function was not normal prior to emergency, life-saving surgery for spontaneous aortic tear, also due to HTN. She is ready and safe for discharge anytime.  Wayman Hoard Z. Orvan Seen, [place]

## 2019-04-21 NOTE — Progress Notes (Signed)
Renal Navigator updated patient's sister that patient has gone to inpt HD and may be discharged today. If patient is discharged today, she will start in the clinic tomorrow. PA has sent orders. Navigator has scheduled patient's SCAT transportation for Monday and Wednesday of next week (schedule is different due to holiday). Navigator printed transportation pick up times and gave them to patient.   Alphonzo Cruise, [place] Renal Navigator [phone]

## 2019-04-21 NOTE — Progress Notes (Signed)
[facility] Progress Note    Assessment/ Plan:   #AoCKD4, right now dialyzing as an AKI, but very possible will not recover sufficient GFR to come off dialysis.   clip complete as AKI- TTS at [facility].  Leukocytosis downtrending, s/p TDC and AVF placement in OR with VVS  11/19.  IV team to remove nontunneled L subclavian HD cath.  Pt now refusing HD today- will try to see if she can come second shift.    #Hypertension: Variable,  Cont UF.   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  Per CT surgery.  #Anemia, acute blood loss: Monitor hemoglobin.  Transfuse as needed. Start darbepoetin 60 Q thurs  #Mood- seems depressed- monitor  Subjective:    S/p TDC and AVF yesterday 11/19.  Refusing to come to HD this AM.     Objective:   BP 131/69 (BP Location: Right Arm)   Pulse (!) 57   Temp 98.2 F (36.8 C) (Oral)   Resp 18   Ht 5\' 3"  (1.6 m)   Wt 56.2 kg   SpO2 93%   BMI 21.95 kg/m   Intake/Output Summary (Last 24 hours) at 04/21/2019 1152 Last data filed at 04/21/2019 5284 Gross per 24 hour  Intake 590 ml  Output 300 ml  Net 290 ml   Weight change: 0 kg  Physical Exam: Gen: earful on HD CVS: RRR healing sternal scar and R [place] incision Resp: clear bilaterally no c/w/r Abd: soft nontender NABS Ext: no LE edema ACCESS: L [place] nontunneled HD cath, R IJ TDC and L AVF +T/B  Imaging: Dg Chest Port 1v Same Day  Result Date: 04/20/2019 CLINICAL DATA:  Postop EXAM: PORTABLE CHEST 1 VIEW COMPARISON:  Radiograph 04/16/2019 FINDINGS: There is a right IJ approach dual lumen catheter with the tip near the superior cavoatrial junction. A left subclavian approach dual lumen catheter terminates at the brachiocephalic-caval confluence. Median sternotomy wires remain intact and aligned. There is been slight interval increase in the size of the cardiac silhouette with increasing left basilar  opacity and left pleural effusion though some of this change may be related to differences in positioning from prior portable exam. Bandlike areas of opacity likely reflect atelectasis. Surgical clips noted in right chest wall soft tissues. No acute osseous or soft tissue abnormality. Degenerative changes are present in the imaged spine and shoulders. IMPRESSION: 1. Increasing left basilar opacity and left pleural effusion. 2. The right IJ approach dual lumen catheter terminates near the superior cavoatrial junction. 3. Left subclavian approach dual lumen catheter at the brachiocephalic-caval confluence, stable positioning Electronically Signed   By: Lovena Le M.D.   On: 04/20/2019 16:42   Dg Fluoro Guide Cv Line-no Report  Result Date: 04/20/2019 Fluoroscopy was utilized by the requesting physician.  No radiographic interpretation.    Labs: BMET Recent Labs  Lab 04/15/19 1357 04/16/19 0612 04/17/19 0500 04/19/19 0500 04/20/19 0529  NA 140 139 137 140 142  K 3.5 3.9 3.5 3.4* 4.1  CL 100 99 97* 103 104  CO2 24 26 26 25 25   GLUCOSE 121* 122* 128* 113* 120*  BUN 43* 33* 47* 35* 41*  CREATININE 3.36* 3.57* 4.94* 4.19* 5.00*  CALCIUM 8.8* 8.8* 8.5* 8.8* 8.8*  PHOS 3.9  --   --   --   --    CBC Recent Labs  Lab 04/15/19 1357 04/16/19 0612 04/17/19 0500 04/19/19 0500 04/20/19 0529  WBC  19.3* 20.6* 17.5* 13.5* 13.0*  NEUTROABS 15.5*  --   --   --   --   HGB 9.5* 9.7* 9.0* 9.6* 8.5*  HCT 30.1* 30.5* 28.1* 29.9* 27.0*  MCV 93.5 94.1 94.0 92.6 93.1  PLT 394 373 425* 474* 535*    Medications:    . amiodarone  200 mg Oral Daily  . amLODipine  10 mg Oral Daily  . aspirin  324 mg Oral Daily  . Chlorhexidine Gluconate Cloth  6 each Topical Daily  . Chlorhexidine Gluconate Cloth  6 each Topical Q0600  . cloNIDine  0.3 mg Transdermal Weekly  . darbepoetin (ARANESP) injection - DIALYSIS  60 mcg Intravenous Q Thu-HD  . docusate sodium  100 mg Oral Daily  . feeding supplement  (NEPRO CARB STEADY)  237 mL Oral BID BM  . hydrALAZINE  25 mg Oral Q8H  . mouth rinse  15 mL Mouth Rinse BID  . metoprolol tartrate  50 mg Oral BID  . multivitamin  1 tablet Oral QHS  . pantoprazole  40 mg Oral Daily  . senna-docusate  2 tablet Oral BID  . sodium chloride flush  3 mL Intravenous Once      Madelon Lips, MD 04/21/2019, 11:52 AM

## 2019-04-21 NOTE — Progress Notes (Addendum)
[facility]  Subjective  - Refusing HD this am   Objective 126/71 63 98.4 F (36.9 C) (Oral) 19 98%  Intake/Output Summary (Last 24 hours) at 04/21/2019 0730 Last data filed at 04/21/2019 2820 Gross per 24 hour  Intake 590 ml  Output 200 ml  Net 390 ml    Left AC incision healing well, palpable thrill grip 5/5, sensation intact Right TDC intact  Assessment/Planning: POD # left BC AV fistula, right TDC  Do not stick left AV fistula for 12 weeks F/U with VVS in 5-6 weeks for duplex of left AV fistula  Roxy Horseman 04/21/2019 7:30 AM --  Laboratory Lab Results: Recent Labs    04/19/19 0500 04/20/19 0529  WBC 13.5* 13.0*  HGB 9.6* 8.5*  HCT 29.9* 27.0*  PLT 474* 535*   BMET Recent Labs    04/19/19 0500 04/20/19 0529  NA 140 142  K 3.4* 4.1  CL 103 104  CO2 25 25  GLUCOSE 113* 120*  BUN 35* 41*  CREATININE 4.19* 5.00*  CALCIUM 8.8* 8.8*    COAG Lab Results  Component Value Date   INR 1.2 04/20/2019   INR 1.3 (H) 04/04/2019   INR 1.0 04/03/2019   No results found for: PTT   I have independently interviewed and examined patient and agree with PA assessment and plan above. [facility] working well. Will evaluate fistula as outpatient.  Brandon C. Donzetta Matters, MD [facility] [place] Office: [phone] Pager: [phone]

## 2019-04-21 NOTE — Progress Notes (Signed)
Renal Navigator notified that patient declined HD yesterday and this morning. Navigator on way to meet with patient and saw her ambulating with RN in hallway. She looked unsteady, needing a lot of support from RN. RN agreed with Navigator, that patient did not seem to be herself today. Patient engaged with Navigator and began to cry, stating that she did not want to go to HD. RN helped patient back to the room and we spoke about her need to have HD treatment today. She continued to refuse, but appeared very "loopy" to Renal Navigator. She stated she wanted to go home and start at her clinic tomorrow. Navigator explained that she is not healthy enough to go home today unless she has treatment and can be re-evaluated for discharge.   Renal Navigator contacted patient's sister to ask that she call patient to provide support. Renal Navigator went back to patient's room approximately an hour later to follow up. She continued to refuse treatment, but with further support and coaxing, Renal Navigator was able to get patient to agree to receive inpatient HD today, with hopes of being able to discharge today and start at her clinic tomorrow. If she is discharged today, she needs to arrive to her clinic/SW tomorrow at 7:00am to complete her consent for treatment prior to her first OP HD treatment. She states that she is willing to go to OP HD. Navigator messaged Attending and Nephrologist to update. Clinic is prepared for patient to start tomorrow.  Alphonzo Cruise,  Renal Navigator [phone]

## 2019-04-21 NOTE — Progress Notes (Signed)
PT Cancellation Note  Patient Details Name: Monique Henderson MRN: [id number] DOB: March 14, 1951   Cancelled Treatment:    Reason Eval/Treat Not Completed: Patient at procedure or test/unavailable Pt off floor. Will follow.   Marguarite Arbour A Ricco Dershem 04/21/2019, 1:59 PM Marisa Severin, PT, DPT Acute Rehabilitation Services Pager [phone] Office [phone]

## 2019-04-21 NOTE — Progress Notes (Signed)
Left subclavian temporary HD catheter d/c'd per MD order. Patient instructions given. Vaseline gauze pressure drsg applied. No bleeding noted at this time.

## 2019-04-21 NOTE — Progress Notes (Signed)
Patient is refusing Dialysis today. "I hate it." Tried to enc. Patient and explain the importance of treatment and she still refuses.

## 2019-04-21 NOTE — Progress Notes (Addendum)
CARDIAC REHAB PHASE I   PRE:  Rate/Rhythm: 64 SR  BP:  Sitting: 112/68      SaO2: 99 RA  MODE:  Ambulation: 20 ft   POST:  Rate/Rhythm: 71 SR  After some convincing, pt agreed to ambulate in hallway. Pt walked 7ft outside of room and become upset and started to cry. Pt returned to room. Pt "off" today. Pt forgetful, and disoriented. Tried convincing pt to go for dialysis, pt continues to decline. Will continue to follow. D/c education completed with pt. Pt given in-the-tube sheet. Encouraged continued ambulation and IS use.  [id number] Rufina Falco, RN BSN 04/21/2019 9:43 AM

## 2019-04-22 MED ORDER — ONDANSETRON HCL 4 MG PO TABS: 4.0000 mg | ORAL_TABLET | Freq: Three times a day (TID) | ORAL | 0 refills | Status: DC | PRN

## 2019-04-22 NOTE — Progress Notes (Signed)
Monique Henderson and her sister were given discharge instructions.  Discussed medication changes, new medications and side effects.  Discussed follow up appointments.  Discussed activities, lifting and driving restrictions.  Discussed dietary choices and fluid restrictions due to Hemodialysis.  Discussed signs and symptoms to watch for and when to contact the physician.  Verbalized understanding.

## 2019-04-22 NOTE — Progress Notes (Signed)
[facility] Progress Note    Assessment/ Plan:   #AoCKD4, right now dialyzing as an AKI, but very possible will not recover sufficient GFR to come off dialysis.   clip complete as AKI- TTS at [facility].  Leukocytosis downtrending, s/p TDC and AVF placement in OR with VVS  11/19.  IV team removed nontunneled L subclavian HD cath.  HD yesterday, will arrive Monday to start at OP unit, ok for discharge from renal perspective.  #Hypertension: Variable,  Cont UF.   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  Per CT surgery.  #Anemia, acute blood loss: Monitor hemoglobin.  Transfuse as needed. Start darbepoetin 60 Q thurs  #Mood- seems depressed- monitor  Subjective:    For d/c today.  Eventually had HD yesterday.   Objective:   BP 103/61 (BP Location: Right Arm)   Pulse 70   Temp 99.1 F (37.3 C) (Oral)   Resp 19   Ht 5\' 3"  (1.6 m)   Wt 48.2 kg   SpO2 96%   BMI 18.81 kg/m   Intake/Output Summary (Last 24 hours) at 04/22/2019 1357 Last data filed at 04/22/2019 0900 Gross per 24 hour  Intake 180 ml  Output 1943 ml  Net -1763 ml   Weight change: 4.4 kg  Physical Exam: Gen: earful on HD CVS: RRR healing sternal scar and R [place] incision Resp: clear bilaterally no c/w/r Abd: soft nontender NABS Ext: no LE edema ACCESS: L [place] nontunneled HD cath, R IJ TDC and L AVF +T/B  Imaging: Dg Chest Port 1v Same Day  Result Date: 04/20/2019 CLINICAL DATA:  Postop EXAM: PORTABLE CHEST 1 VIEW COMPARISON:  Radiograph 04/16/2019 FINDINGS: There is a right IJ approach dual lumen catheter with the tip near the superior cavoatrial junction. A left subclavian approach dual lumen catheter terminates at the brachiocephalic-caval confluence. Median sternotomy wires remain intact and aligned. There is been slight interval increase in the size of the cardiac silhouette with increasing left basilar  opacity and left pleural effusion though some of this change may be related to differences in positioning from prior portable exam. Bandlike areas of opacity likely reflect atelectasis. Surgical clips noted in right chest wall soft tissues. No acute osseous or soft tissue abnormality. Degenerative changes are present in the imaged spine and shoulders. IMPRESSION: 1. Increasing left basilar opacity and left pleural effusion. 2. The right IJ approach dual lumen catheter terminates near the superior cavoatrial junction. 3. Left subclavian approach dual lumen catheter at the brachiocephalic-caval confluence, stable positioning Electronically Signed   By: Lovena Le M.D.   On: 04/20/2019 16:42   Dg Fluoro Guide Cv Line-no Report  Result Date: 04/20/2019 Fluoroscopy was utilized by the requesting physician.  No radiographic interpretation.    Labs: BMET Recent Labs  Lab 04/16/19 0612 04/17/19 0500 04/19/19 0500 04/20/19 0529 04/21/19 1730  NA 139 137 140 142 135  K 3.9 3.5 3.4* 4.1 4.1  CL 99 97* 103 104 99  CO2 26 26 25 25 25   GLUCOSE 122* 128* 113* 120* 112*  BUN 33* 47* 35* 41* 27*  CREATININE 3.57* 4.94* 4.19* 5.00* 3.55*  CALCIUM 8.8* 8.5* 8.8* 8.8* 8.3*  PHOS  --   --   --   --  4.2   CBC Recent Labs  Lab 04/17/19 0500 04/19/19 0500 04/20/19 0529 04/21/19 1730  WBC 17.5* 13.5* 13.0* 12.1*  NEUTROABS  --   --   --  10.3*  HGB 9.0* 9.6* 8.5* 8.7*  HCT 28.1* 29.9* 27.0* 27.6*  MCV 94.0 92.6 93.1 93.6  PLT 425* 474* 535* 564*    Medications:    . amiodarone  200 mg Oral Daily  . amLODipine  10 mg Oral Daily  . aspirin  324 mg Oral Daily  . Chlorhexidine Gluconate Cloth  6 each Topical Daily  . Chlorhexidine Gluconate Cloth  6 each Topical Q0600  . cloNIDine  0.3 mg Transdermal Weekly  . darbepoetin (ARANESP) injection - DIALYSIS  60 mcg Intravenous Q Thu-HD  . docusate sodium  100 mg Oral Daily  . feeding supplement (NEPRO CARB STEADY)  237 mL Oral BID BM  .  hydrALAZINE  25 mg Oral Q8H  . mouth rinse  15 mL Mouth Rinse BID  . metoprolol tartrate  50 mg Oral BID  . multivitamin  1 tablet Oral QHS  . pantoprazole  40 mg Oral Daily  . senna-docusate  2 tablet Oral BID  . sodium chloride flush  3 mL Intravenous Once      Madelon Lips, MD 04/22/2019, 1:57 PM

## 2019-04-22 NOTE — Progress Notes (Signed)
      [address]       [place] [address]             [phone]        2 Days Post-Op Procedure(s) (LRB): ARTERIOVENOUS (AV) FISTULA CREATION (Left) INSERTION OF DIALYSIS CATHETER, right internal jugular (Right)  Subjective: Patient sleeping this am but awakened. She is excited to go home.  Objective: Vital signs in last 24 hours: Temp:  [98 F (36.7 C)-100.3 F (37.9 C)] 98.5 F (36.9 C) (11/21 0531) Pulse Rate:  [57-74] 67 (11/21 0531) Cardiac Rhythm: Normal sinus rhythm (11/20 1900) Resp:  [13-23] 22 (11/21 0531) BP: (95-143)/(57-71) 118/57 (11/21 0531) SpO2:  [93 %-100 %] 96 % (11/21 0531) Weight:  [48.2 kg-56.9 kg] 48.2 kg (11/21 0531)  Pre op weight 58.9 kg Current Weight  04/22/19 48.2 kg       Intake/Output from previous day: 11/20 0701 - 11/21 0700 In: 60 [P.O.:60] Out: 2043 [Urine:100]   Physical Exam:  Cardiovascular: RRR Pulmonary: Slightly diminished left breath sounds Abdomen: Soft, non tender, bowel sounds present. Extremities: No lower extremity edema. Wounds: Sternal and axillary wounds are clean and dry.  No erythema or signs of infection. Left catheter wound is mostly clean and dry. Left arm wound has derma bond at brachial area.  Lab Results: CBC: Recent Labs    04/20/19 0529 04/21/19 1730  WBC 13.0* 12.1*  HGB 8.5* 8.7*  HCT 27.0* 27.6*  PLT 535* 564*   BMET:  Recent Labs    04/20/19 0529 04/21/19 1730  NA 142 135  K 4.1 4.1  CL 104 99  CO2 25 25  GLUCOSE 120* 112*  BUN 41* 27*  CREATININE 5.00* 3.55*  CALCIUM 8.8* 8.3*    PT/INR:  Lab Results  Component Value Date   INR 1.2 04/20/2019   INR 1.3 (H) 04/04/2019   INR 1.0 04/03/2019   ABG:  INR: Will add last result for INR, ABG once components are confirmed Will add last 4 CBG results once components are confirmed  Assessment/Plan:  1. CV -  SR with HR in the high 60's this am and BP remains well controlled. On Amiodarone 200 mg daily,  Amlodipine 10 mg daily, Clonidine patch, Hydralazine 25 mg tid, and Lopressor 50 mg bid. 2.  Pulmonary - On room air this am. According to nurse, she is put on oxygen because of her request, not because of desaturation or hypoxia. Encourage incentive spirometer 3. Acute on chronic CKD (stage IV)-S/p left IJ TDC and left arm brachial artery to cephalic vein AV fistula. Creatinine yesterday 3.55. 4.  Anemia of chronic disease, surgery-Last H and H  stable at 8.7 and 27.6 5. Leukocytosis-WBC continues to decrease, 12100 yesterday. Low grade fever to 99.6. She has a catheter left subclavian and that was removed 11/20. UA showed trace leukocytes and rare bacteria, no sign of wound infection, no PNA noted on last CXR. She was treated with empiric Zosyn for over a week. Possible etiology temporary HD catheter? 6. Discharge;outpatien HD has been arranged according to care manager  Kimberely Mccannon M ZimmermanPA-C 04/22/2019,7:22 AM

## 2019-04-22 NOTE — Plan of Care (Signed)

## 2019-04-22 NOTE — Progress Notes (Signed)
Patient states she was very dizzy while walking to the bathroom.  States she does not feel like she can go home.  Call placed to D. Tacy Dura, [place], and will hold discharge tonight.

## 2019-04-22 NOTE — Plan of Care (Signed)
  Problem: Education: Goal: Knowledge of General Education information will improve Description Including pain rating scale, medication(s)/side effects and non-pharmacologic comfort measures Outcome: Progressing   Problem: Health Behavior/Discharge Planning: Goal: Ability to manage health-related needs will improve Outcome: Progressing   

## 2019-04-22 NOTE — Progress Notes (Signed)
Pt in bed awaiting discharge. Pt sleepy and declined ambulation. Discussed CRPII with pt. Left brochure. Will send referral to [facility] at [facility]. Pt states she has no questions regarding education.  Carma Lair MS, ACSM CEP 10:01 AM 04/22/2019

## 2019-04-23 MED ORDER — DARBEPOETIN ALFA 60 MCG/0.3ML IJ SOSY: 60.0000 ug | PREFILLED_SYRINGE | INTRAMUSCULAR | Status: DC

## 2019-04-23 NOTE — Progress Notes (Signed)
      [address]       [place] [address]             [phone]        3 Days Post-Op Procedure(s) (LRB): ARTERIOVENOUS (AV) FISTULA CREATION (Left) INSERTION OF DIALYSIS CATHETER, right internal jugular (Right)  Subjective: Patient later told nurse she had nausea then dizziness so discharge held yesterday. Patient wide awake this am and states "I want to go home". She denies nausea or dizziness.  Objective: Vital signs in last 24 hours: Temp:  [98.4 F (36.9 C)-99.1 F (37.3 C)] 98.7 F (37.1 C) (11/22 0300) Pulse Rate:  [67-78] 78 (11/21 2330) Cardiac Rhythm: Normal sinus rhythm (11/21 1900) Resp:  [18-22] 18 (11/22 0300) BP: (103-179)/(61-75) 135/74 (11/22 0300) SpO2:  [95 %-96 %] 96 % (11/22 0300) Weight:  [45.1 kg] 45.1 kg (11/22 0300)  Pre op weight 58.9 kg Current Weight  04/23/19 45.1 kg       Intake/Output from previous day: 11/21 0701 - 11/22 0700 In: 120 [P.O.:120] Out: -    Physical Exam:  Cardiovascular: RRR Pulmonary: Slightly diminished left breath sounds Abdomen: Soft, non tender, bowel sounds present. Extremities: No lower extremity edema. Wounds: Sternal and axillary wounds are clean and dry.  No erythema or signs of infection. Left catheter wound is mostly clean and dry. Left arm wound has derma bond at brachial area.  Lab Results: CBC: Recent Labs    04/21/19 1730  WBC 12.1*  HGB 8.7*  HCT 27.6*  PLT 564*   BMET:  Recent Labs    04/21/19 1730  NA 135  K 4.1  CL 99  CO2 25  GLUCOSE 112*  BUN 27*  CREATININE 3.55*  CALCIUM 8.3*    PT/INR:  Lab Results  Component Value Date   INR 1.2 04/20/2019   INR 1.3 (H) 04/04/2019   INR 1.0 04/03/2019   ABG:  INR: Will add last result for INR, ABG once components are confirmed Will add last 4 CBG results once components are confirmed  Assessment/Plan:  1. CV -  SR with HR in the high 60's this am and BP remains well controlled. On Amiodarone 200 mg  daily, Amlodipine 10 mg daily, Clonidine patch, Hydralazine 25 mg tid, and Lopressor 50 mg bid. 2.  Pulmonary - On room air this am. Encourage incentive spirometer 3. Acute on chronic CKD (stage IV)-S/p left IJ TDC and left arm brachial artery to cephalic vein AV fistula. Creatinine yesterday 3.55. 4.  Anemia of chronic disease, surgery-Last H and H  stable at 8.7 and 27.6 6. Discharge;outpatien HD has been arranged according to care manager  Donielle M ZimmermanPA-C 04/23/2019,7:20 AM

## 2019-04-23 NOTE — Progress Notes (Signed)
  [facility] Progress Note    Assessment/ Plan:   #AoCKD4, right now dialyzing as an AKI, but very possible will not recover sufficient GFR to come off dialysis.   clip complete as AKI- TTS at [facility].  Leukocytosis downtrending, s/p TDC and AVF placement in OR with VVS  11/19.  IV team removed nontunneled L subclavian HD cath.  HD 11/20, will arrive Monday to start at OP unit, ok for discharge from renal perspective.  #Hypertension: Variable,  Cont UF.   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  Per CT surgery.  #Anemia, acute blood loss: Monitor hemoglobin.  Transfuse as needed. Start darbepoetin 60 Q thurs  #Mood- seems depressed- monitor  Subjective:    Didn't d/c yesterday, felt dizzy.  This AM she states that her sister is coming to pick her up.     Objective:   BP (!) 129/59 (BP Location: Right Arm)   Pulse 68   Temp 98.7 F (37.1 C) (Oral)   Resp 19   Ht 5\' 3"  (1.6 m)   Wt 45.1 kg   SpO2 96%   BMI 17.61 kg/m  No intake or output data in the 24 hours ending 04/23/19 1052 Weight change: -11.8 kg  Physical Exam: Gen: NAD, sitting up in bed CVS: RRR healing sternal scar and R [employer] incision Resp: clear bilaterally no c/w/r Abd: soft nontender NABS Ext: no LE edema ACCESS: L [place] nontunneled HD cath removed, R IJ TDC and L AVF +T/B  Imaging: No results found.  Labs: BMET Recent Labs  Lab 04/17/19 0500 04/19/19 0500 04/20/19 0529 04/21/19 1730  NA 137 140 142 135  K 3.5 3.4* 4.1 4.1  CL 97* 103 104 99  CO2 26 25 25 25   GLUCOSE 128* 113* 120* 112*  BUN 47* 35* 41* 27*  CREATININE 4.94* 4.19* 5.00* 3.55*  CALCIUM 8.5* 8.8* 8.8* 8.3*  PHOS  --   --   --  4.2   CBC Recent Labs  Lab 04/17/19 0500 04/19/19 0500 04/20/19 0529 04/21/19 1730  WBC 17.5* 13.5* 13.0* 12.1*  NEUTROABS  --   --   --  10.3*  HGB 9.0* 9.6* 8.5* 8.7*  HCT 28.1* 29.9* 27.0*  27.6*  MCV 94.0 92.6 93.1 93.6  PLT 425* 474* 535* 564*    Medications:    . amiodarone  200 mg Oral Daily  . amLODipine  10 mg Oral Daily  . aspirin  324 mg Oral Daily  . Chlorhexidine Gluconate Cloth  6 each Topical Daily  . Chlorhexidine Gluconate Cloth  6 each Topical Q0600  . cloNIDine  0.3 mg Transdermal Weekly  . [START ON 04/24/2019] darbepoetin (ARANESP) injection - DIALYSIS  60 mcg Intravenous Q Mon-HD  . docusate sodium  100 mg Oral Daily  . feeding supplement (NEPRO CARB STEADY)  237 mL Oral BID BM  . hydrALAZINE  25 mg Oral Q8H  . mouth rinse  15 mL Mouth Rinse BID  . metoprolol tartrate  50 mg Oral BID  . multivitamin  1 tablet Oral QHS  . pantoprazole  40 mg Oral Daily  . senna-docusate  2 tablet Oral BID  . sodium chloride flush  3 mL Intravenous Once      Madelon Lips, MD 04/23/2019, 10:52 AM

## 2019-04-23 NOTE — Progress Notes (Signed)
Pt. Discharged instructions provided. All questions and concerns answered. Pt. Sister at bedside. IV out. CCMD notified.  Paulene Floor, RN 11:10 AM  04/23/2019

## 2019-04-24 ENCOUNTER — Telehealth (HOSPITAL_COMMUNITY): Payer: Self-pay

## 2019-04-24 NOTE — Telephone Encounter (Signed)
Called and spoke with pt in regards to CR, pt stated she does not think she will be able to participate at this time due to dialysis.  Closed referral

## 2019-05-05 ENCOUNTER — Other Ambulatory Visit: Payer: Self-pay | Admitting: Cardiothoracic Surgery

## 2019-05-05 ENCOUNTER — Ambulatory Visit: Payer: Medicaid Other | Admitting: Cardiothoracic Surgery

## 2019-05-05 DIAGNOSIS — Z8679 Personal history of other diseases of the circulatory system: Secondary | ICD-10-CM

## 2019-05-05 DIAGNOSIS — Z9889 Other specified postprocedural states: Secondary | ICD-10-CM

## 2019-05-08 ENCOUNTER — Ambulatory Visit (INDEPENDENT_AMBULATORY_CARE_PROVIDER_SITE_OTHER): Payer: Self-pay | Admitting: Cardiothoracic Surgery

## 2019-05-08 ENCOUNTER — Ambulatory Visit
Admission: RE | Admit: 2019-05-08 | Discharge: 2019-05-08 | Disposition: A | Discharge status: Home / Self Care | Payer: Medicare Other | Source: Ambulatory Visit | Attending: Cardiothoracic Surgery | Admitting: Cardiothoracic Surgery

## 2019-05-08 ENCOUNTER — Other Ambulatory Visit: Payer: Self-pay

## 2019-05-08 VITALS — BP 189/83 | HR 67 | Temp 97.7°F | Resp 24 | Ht 63.0 in | Wt 112.3 lb

## 2019-05-08 DIAGNOSIS — Z9889 Other specified postprocedural states: Secondary | ICD-10-CM

## 2019-05-08 DIAGNOSIS — Z95828 Presence of other vascular implants and grafts: Secondary | ICD-10-CM

## 2019-05-08 DIAGNOSIS — Z8679 Personal history of other diseases of the circulatory system: Secondary | ICD-10-CM

## 2019-05-09 NOTE — Progress Notes (Signed)
[address]       [place] [address]             [phone]     CARDIOTHORACIC SURGERY OFFICE NOTE  Referring Provider is Drenda Freeze, MD Primary Cardiologist is No primary care provider on file. 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 She had pre-surgical severe renal insufficiency and has required HD since surgery. Otherwise, she has done very well. Presents for outpatient visit without complaints.    Current Outpatient Medications  Medication Sig Dispense Refill  . albuterol (VENTOLIN HFA) 108 (90 Base) MCG/ACT inhaler Inhale 2 puffs into the lungs every 6 (six) hours as needed for wheezing or shortness of breath. 8 g 5  . amiodarone (PACERONE) 200 MG tablet Take 1 tablet (200 mg total) by mouth daily. For 2 weeks then stop. 14 tablet 0  . amLODipine (NORVASC) 10 MG tablet TAKE 1 TABLET (10 MG TOTAL) BY MOUTH DAILY. 90 tablet 0  . aspirin EC 325 MG tablet Take 1 tablet (325 mg total) by mouth daily.  3  . cloNIDine (CATAPRES - DOSED IN MG/24 HR) 0.3 mg/24hr patch Place 1 patch (0.3 mg total) onto the skin once a week. 4 patch 2  . famotidine (PEPCID) 10 MG tablet Take 1 tablet (10 mg total) by mouth at bedtime. 90 tablet 1  . hydrALAZINE (APRESOLINE) 25 MG tablet Take 1 tablet (25 mg total) by mouth every 8 (eight) hours. 90 tablet 1  . metoprolol tartrate (LOPRESSOR) 50 MG tablet Take 1 tablet (50 mg total) by mouth 2 (two) times daily. 60 tablet 1  . multivitamin (RENA-VIT) TABS tablet Take 1 tablet by mouth at bedtime. 30 tablet 1  . ondansetron (ZOFRAN) 4 MG tablet Take 1 tablet (4 mg total) by mouth every 8 (eight) hours as needed for nausea or vomiting. 20 tablet 0  . oxyCODONE (OXY IR/ROXICODONE) 5 MG immediate release tablet Take 1 tablet (5 mg total) by mouth every 4 (four) hours as needed for severe pain. 30 tablet 0  . pregabalin (LYRICA) 75 MG capsule TAKE 1  CAPSULE (75 MG TOTAL) BY MOUTH 2 (TWO) TIMES DAILY. 60 capsule 6   No current facility-administered medications for this visit.       Physical Exam:   BP (!) 189/83 (BP Location: Right Arm, Patient Position: Sitting, Cuff Size: Normal) Comment (Patient Position): sitt  Pulse 67   Temp 97.7 F (36.5 C) (Skin)   Resp (!) 24   Ht 5\' 3"  (1.6 m)   Wt 50.9 kg   SpO2 97% Comment: RA  BMI 19.89 kg/m   General:  Thin, NAD  Chest:   cta  CV:   rrr  Incisions:  Well-healed  Abdomen:  sntnd  Extremities:  No edema  Diagnostic Tests:  CXR with clear lung fields except very small effusion at left base   Impression:  Doing well after repair of ascending aortic intramural hematoma/dissection equivalent  Plan: F/U with primary care and nephrology; may return to full activity from CT surgery perspective.   I spent in excess of 20 minutes during the conduct of this office consultation and >50% of this time involved direct face-to-face encounter with the patient for counseling and/or coordination of their care.  Level 2                 10 minutes Level  3                 15 minutes Level 4                 25 minutes Level 5                 40 minutes  B. Murvin Natal, MD 05/09/2019 9:19 PM

## 2019-05-13 ENCOUNTER — Emergency Department (HOSPITAL_COMMUNITY): Payer: Medicare Other

## 2019-05-13 ENCOUNTER — Other Ambulatory Visit: Payer: Self-pay

## 2019-05-13 ENCOUNTER — Emergency Department (HOSPITAL_COMMUNITY)
Admission: EM | Admit: 2019-05-13 | Discharge: 2019-05-13 | Disposition: A | Discharge status: Home / Self Care | Payer: Medicare Other | Attending: Emergency Medicine | Admitting: Emergency Medicine

## 2019-05-13 DIAGNOSIS — Z7982 Long term (current) use of aspirin: Secondary | ICD-10-CM | POA: Diagnosis not present

## 2019-05-13 DIAGNOSIS — R079 Chest pain, unspecified: Secondary | ICD-10-CM | POA: Diagnosis present

## 2019-05-13 DIAGNOSIS — J90 Pleural effusion, not elsewhere classified: Secondary | ICD-10-CM | POA: Diagnosis not present

## 2019-05-13 DIAGNOSIS — I129 Hypertensive chronic kidney disease with stage 1 through stage 4 chronic kidney disease, or unspecified chronic kidney disease: Secondary | ICD-10-CM | POA: Insufficient documentation

## 2019-05-13 DIAGNOSIS — Z79899 Other long term (current) drug therapy: Secondary | ICD-10-CM | POA: Diagnosis not present

## 2019-05-13 DIAGNOSIS — N183 Chronic kidney disease, stage 3 unspecified: Secondary | ICD-10-CM | POA: Diagnosis not present

## 2019-05-13 DIAGNOSIS — F17210 Nicotine dependence, cigarettes, uncomplicated: Secondary | ICD-10-CM | POA: Diagnosis not present

## 2019-05-13 DIAGNOSIS — J449 Chronic obstructive pulmonary disease, unspecified: Secondary | ICD-10-CM | POA: Diagnosis not present

## 2019-05-13 LAB — CBC
HCT: 32.9 % — ABNORMAL LOW (ref 36.0–46.0)
Hemoglobin: 10.2 g/dL — ABNORMAL LOW (ref 12.0–15.0)
MCH: 28.8 pg (ref 26.0–34.0)
MCHC: 31 g/dL (ref 30.0–36.0)
MCV: 92.9 fL (ref 80.0–100.0)
Platelets: 427 10*3/uL — ABNORMAL HIGH (ref 150–400)
RBC: 3.54 MIL/uL — ABNORMAL LOW (ref 3.87–5.11)
RDW: 16.3 % — ABNORMAL HIGH (ref 11.5–15.5)
WBC: 9.4 10*3/uL (ref 4.0–10.5)
nRBC: 0 % (ref 0.0–0.2)

## 2019-05-13 LAB — COMPREHENSIVE METABOLIC PANEL
ALT: 7 U/L (ref 0–44)
AST: 12 U/L — ABNORMAL LOW (ref 15–41)
Albumin: 2.9 g/dL — ABNORMAL LOW (ref 3.5–5.0)
Alkaline Phosphatase: 72 U/L (ref 38–126)
Anion gap: 12 (ref 5–15)
BUN: 15 mg/dL (ref 8–23)
CO2: 28 mmol/L (ref 22–32)
Calcium: 8.9 mg/dL (ref 8.9–10.3)
Chloride: 97 mmol/L — ABNORMAL LOW (ref 98–111)
Creatinine, Ser: 3.37 mg/dL — ABNORMAL HIGH (ref 0.44–1.00)
GFR calc Af Amer: 16 mL/min — ABNORMAL LOW (ref >60)
GFR calc non Af Amer: 14 mL/min — ABNORMAL LOW (ref >60)
Glucose, Bld: 114 mg/dL — ABNORMAL HIGH (ref 70–99)
Potassium: 4.1 mmol/L (ref 3.5–5.1)
Sodium: 137 mmol/L (ref 135–145)
Total Bilirubin: 1.1 mg/dL (ref 0.3–1.2)
Total Protein: 7.8 g/dL (ref 6.5–8.1)

## 2019-05-13 MED ORDER — MORPHINE SULFATE (PF) 4 MG/ML IV SOLN
4.0000 mg | Freq: Once | INTRAVENOUS | Status: AC
  Administered 2019-05-13: 4 mg via INTRAVENOUS
  Filled 2019-05-13: qty 1

## 2019-05-13 MED ORDER — ONDANSETRON HCL 4 MG/2ML IJ SOLN
4.0000 mg | Freq: Once | INTRAMUSCULAR | Status: AC
  Administered 2019-05-13: 4 mg via INTRAVENOUS
  Filled 2019-05-13: qty 2

## 2019-05-13 MED ORDER — TRAMADOL HCL 50 MG PO TABS
50.0000 mg | ORAL_TABLET | Freq: Once | ORAL | Status: AC
  Administered 2019-05-13: 15:00:00 50 mg via ORAL
  Filled 2019-05-13: qty 1

## 2019-05-13 MED ORDER — TRAMADOL HCL 50 MG PO TABS: 50.0000 mg | ORAL_TABLET | Freq: Two times a day (BID) | ORAL | 0 refills | Status: DC | PRN

## 2019-05-13 NOTE — ED Notes (Signed)
Patient verbalizes understanding of discharge instructions. Opportunity for questioning and answers were provided. pt discharged from ED with family.   

## 2019-05-13 NOTE — ED Provider Notes (Signed)
[facility] EMERGENCY DEPARTMENT Provider Note   CSN: [id number] Arrival date & time: 05/13/19  0938     History Chief Complaint  Patient presents with  . Chest Pain    Monique Henderson is a 68 y.o. female.  Patient c/o sharp left chest pain since having surgical repair of ascending aorta dissection 1 month ago. Symptoms constant, sharp, persistent, non radiating, worse w deep breath, worse w certain movements and position changes. No change whether upright or supine. States pain makes her feel sob. Denies acute or abrupt change today, but states was having at dialysis today and they sent to ED via EMS. Patient states saw her surgeon 3 days ago for same, and was told possibly due to fluid in chest. Patient denies hx ptx, no hx dvt or pe. Denies leg pain or swelling. Occasional non prod cough. No sore throat or runny nose. No known covid + exposure. No fever or chills.   The history is provided by the patient and the EMS personnel.  Chest Pain Associated symptoms: shortness of breath   Associated symptoms: no abdominal pain, no back pain, no fever, no headache, no nausea, no palpitations and no vomiting        Past Medical History:  Diagnosis Date  . Atelectasis 2002   Bilateral  . Bone spur 2008   Right calcaneal foot spur  . Breast cancer ([place]) 2004   Ductal carcinoma in situ of the left breast; S/P left partial mastectomy 02/26/2003; S/P re-excision of cranial and lateral margins11/18/2004.radiation  . Breast cancer ([place]) 09/21/2012   right breast/ last radiation treatment 03/22/2013  . Chronic kidney disease, stage IV (severe) ([place]) 10/10/2007  . 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 Autumn Messing; S/P re-excision of superior and inferior margins 10/27/2012.   Marland Kitchen GERD (gastroesophageal reflux disease)   . 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 Hot flashes   . Hx of  radiation therapy 2005   left breast  . Hx of radiation therapy 01/11/13- 03/22/13   right breast 5760 cGy 30 sessions  . Hypertension   . Low back pain   . Lumbar spinal stenosis    S/P lumbar decompressive laminectomy, fusion and plating for lumbar spinal stensosis  . Normocytic anemia    With thrombocytosis  . Osteoarthritis   . Personal history of radiation therapy   . Right ureteral stone 2002  . Shortness of breath    from pain  . Uterine fibroid   . Wears dentures    top    Patient Active Problem List   Diagnosis Date Noted  . S/P aortic aneurysm repair 04/07/2019  . Aortic dissection ([place]) 04/04/2019  . Dissection of aorta ([place]) 04/03/2019  . Non compliance with medical treatment 12/04/2017  . Chronic kidney disease (CKD) stage G3b/A2, moderately decreased glomerular filtration rate (GFR) between 30-44 mL/min/1.73 square meter and albuminuria creatinine ratio between 30-299 mg/g 07/19/2017  . Chronic obstructive lung disease ([place]) 01/16/2017  . Chronic low back pain 06/22/2016  . Insomnia 03/14/2015  . S/P lumbar spinal fusion 01/18/2014  . Tobacco use disorder 04/19/2009  . Essential hypertension 09/16/2006  . GERD 09/16/2006    Past Surgical History:  Procedure Laterality Date  . ANTERIOR LAT LUMBAR FUSION N/A 01/18/2014   Procedure: ANTERIOR LATERAL LUMBAR FUSION LUMBAR TWO-THREE;  Surgeon: Eustace Moore, MD;  Location: [place] [facility];  Service:  Neurosurgery;  Laterality: N/A;  . ANTERIOR LUMBAR FUSION  01/18/2014  . AV FISTULA PLACEMENT Left 04/20/2019   Procedure: ARTERIOVENOUS (AV) FISTULA CREATION;  Surgeon: [name], MD;  Location: [place];  Service: Vascular;  Laterality: Left;  . BACK SURGERY    . BREAST LUMPECTOMY Left 01/2003  . BREAST LUMPECTOMY Right 2014  . BREAST LUMPECTOMY WITH NEEDLE LOCALIZATION AND AXILLARY SENTINEL LYMPH NODE BX Right 10/13/2012   Procedure: BREAST LUMPECTOMY WITH NEEDLE LOCALIZATION;  Surgeon: [name], MD;   Location: [place];  Service: General;  Laterality: Right;  Right breast wire localized lumpectomy  . INSERTION OF DIALYSIS CATHETER Right 04/20/2019   Procedure: INSERTION OF DIALYSIS CATHETER, right internal jugular;  Surgeon: [name], MD;  Location: [place];  Service: Vascular;  Laterality: Right;  . LAMINECTOMY  05/27/2009   Lumbar decompressive laminectomy, fusion and plating for lumbar spinal stensosis  . LUMBAR LAMINECTOMY/DECOMPRESSION MICRODISCECTOMY Left 03/23/2013   Procedure: LUMBAR LAMINECTOMY/DECOMPRESSION MICRODISCECTOMY 1 LEVEL;  Surgeon: Eustace Moore, MD;  Location: [place] [facility];  Service: Neurosurgery;  Laterality: Left;  LUMBAR LAMINECTOMY/DECOMPRESSION MICRODISCECTOMY 1 LEVEL  . MASTECTOMY, PARTIAL Left 02/26/2003   ; S/P re-excision of cranial and lateral margins 04/19/2003.   Marland Kitchen RE-EXCISION OF BREAST CANCER,SUPERIOR MARGINS Right 10/27/2012   Procedure: RE-EXCISION OF BREAST CANCER,SUPERIOR and inferior MARGINS;  Surgeon: [name], MD;  Location: [place];  Service: General;  Laterality: Right;  . RE-EXCISION OF BREAST LUMPECTOMY Left 04/2003  . TEE WITHOUT CARDIOVERSION N/A 04/04/2019   Procedure: Transesophageal Echocardiogram (Tee);  Surgeon: Wonda Olds, MD;  Location: [place];  Service: Open Heart Surgery;  Laterality: N/A;  . THORACIC AORTIC ANEURYSM REPAIR N/A 04/04/2019   Procedure: THORACIC ASCENDING ANEURYSM REPAIR (AAA)  USING 28 MM X 30 CM HEMASHIELD PLATINUM VASCULAR GRAFT;  Surgeon: Wonda Olds, MD;  Location: [facility];  Service: Open Heart Surgery;  Laterality: N/A;     OB History    Gravida  2   Para  2   Term  1   Preterm  1   AB      Living        SAB      TAB      Ectopic      Multiple      Live Births              Family History  Problem Relation Age of Onset  . Colon cancer Mother 7  . Hypertension Mother   . Diabetes Sister 54  . Hypertension Sister   . Diabetes Brother   .  Hypertension Brother   . Diabetes Brother   . Hypertension Brother   . Kidney disease Son        On dialysis  . Hypertension Son   . Diabetes Son   . Multiple sclerosis Son   . Bone cancer Sister 37  . Breast cancer Neg Hx   . Cervical cancer Neg Hx     Social History   Tobacco Use  . Smoking status: Current Some Day Smoker    Packs/day: 0.25    Years: 44.00    Pack years: 11.00    Types: Cigarettes  . Smokeless tobacco: Never Used  . Tobacco comment: smoking less  Substance Use Topics  . Alcohol use: Yes    Alcohol/week: 2.0 standard drinks    Types: 2 Cans of beer per week    Comment:  "couple beers  q weekend"  . Drug use: Yes    Types: Cocaine    Comment: "stopped back in the 1980's"    Home Medications Prior to Admission medications   Medication Sig Start Date End Date Taking? Authorizing Provider  albuterol (VENTOLIN HFA) 108 (90 Base) MCG/ACT inhaler Inhale 2 puffs into the lungs every 6 (six) hours as needed for wheezing or shortness of breath. 02/02/19   [name], MD  amiodarone (PACERONE) 200 MG tablet Take 1 tablet (200 mg total) by mouth daily. For 2 weeks then stop. 04/21/19   Nani Skillern, PA-C  amLODipine (NORVASC) 10 MG tablet TAKE 1 TABLET (10 MG TOTAL) BY MOUTH DAILY. 09/20/18   [name], MD  aspirin EC 325 MG tablet Take 1 tablet (325 mg total) by mouth daily. 04/21/19 04/20/20  Nani Skillern, PA-C  cloNIDine (CATAPRES - DOSED IN MG/24 HR) 0.3 mg/24hr patch Place 1 patch (0.3 mg total) onto the skin once a week. 04/22/19   Nani Skillern, PA-C  famotidine (PEPCID) 10 MG tablet Take 1 tablet (10 mg total) by mouth at bedtime. 05/04/18   [name], MD  hydrALAZINE (APRESOLINE) 25 MG tablet Take 1 tablet (25 mg total) by mouth every 8 (eight) hours. 04/21/19   Nani Skillern, PA-C  metoprolol tartrate (LOPRESSOR) 50 MG tablet Take 1 tablet (50 mg total) by mouth 2 (two) times daily. 04/21/19   Nani Skillern, PA-C  multivitamin (RENA-VIT) TABS tablet Take 1 tablet by mouth at bedtime. 04/21/19   Nani Skillern, PA-C  ondansetron (ZOFRAN) 4 MG tablet Take 1 tablet (4 mg total) by mouth every 8 (eight) hours as needed for nausea or vomiting. 04/22/19   Nani Skillern, PA-C  oxyCODONE (OXY IR/ROXICODONE) 5 MG immediate release tablet Take 1 tablet (5 mg total) by mouth every 4 (four) hours as needed for severe pain. 04/21/19   Nani Skillern, PA-C  pregabalin (LYRICA) 75 MG capsule TAKE 1 CAPSULE (75 MG TOTAL) BY MOUTH 2 (TWO) TIMES DAILY. 09/15/18   Forrest Moron, MD    Allergies    Shrimp [shellfish allergy], Bactroban [mupirocin], Vicodin [hydrocodone-acetaminophen], Lisinopril, and Tylenol [acetaminophen]  Review of Systems   Review of Systems  Constitutional: Negative for chills and fever.  HENT: Negative for sore throat.   Eyes: Negative for redness.  Respiratory: Positive for shortness of breath.   Cardiovascular: Positive for chest pain. Negative for palpitations and leg swelling.  Gastrointestinal: Negative for abdominal pain, nausea and vomiting.  Genitourinary: Negative for flank pain.  Musculoskeletal: Negative for back pain and neck pain.  Skin: Negative for rash.  Neurological: Negative for headaches.  Hematological: Does not bruise/bleed easily.  Psychiatric/Behavioral: Negative for confusion.    Physical Exam Updated Vital Signs BP (!) 161/104 (BP Location: Right Arm)   Pulse 86   Temp 98.4 F (36.9 C) (Oral)   Resp (!) 24   Ht 1.6 m (5\' 3" )   Wt 50 kg   SpO2 100%   BMI 19.53 kg/m   Physical Exam Vitals and nursing note reviewed.  Constitutional:      Appearance: Normal appearance. She is well-developed.  HENT:     Head: Atraumatic.     Nose: Nose normal.     Mouth/Throat:     Mouth: Mucous membranes are moist.  Eyes:     General: No scleral icterus.    Conjunctiva/sclera: Conjunctivae normal.  Neck:     Trachea: No tracheal  deviation.  Cardiovascular:     Rate and Rhythm: Normal rate and regular rhythm.     Pulses: Normal pulses.     Heart sounds: Normal heart sounds. No murmur. No friction rub. No gallop.   Pulmonary:     Effort: Pulmonary effort is normal. No respiratory distress.     Breath sounds: Normal breath sounds.     Comments: HD cath right chest without sign of infection. +chest wall tenderness, reproducing symptoms.  Chest:     Chest wall: Tenderness present.  Abdominal:     General: Bowel sounds are normal. There is no distension.     Palpations: Abdomen is soft.     Tenderness: There is no abdominal tenderness. There is no guarding.  Genitourinary:    Comments: No cva tenderness.  Musculoskeletal:        General: No swelling or tenderness.     Cervical back: Normal range of motion and neck supple. No rigidity. No muscular tenderness.     Right lower leg: No edema.     Left lower leg: No edema.  Skin:    General: Skin is warm and dry.     Findings: No rash.  Neurological:     Mental Status: She is alert.     Comments: Alert, speech normal.   Psychiatric:        Mood and Affect: Mood normal.     ED Results / Procedures / Treatments   Labs (all labs ordered are listed, but only abnormal results are displayed) Results for orders placed or performed during the hospital encounter of 05/13/19  CBC  Result Value Ref Range   WBC 9.4 4.0 - 10.5 K/uL   RBC 3.54 (L) 3.87 - 5.11 MIL/uL   Hemoglobin 10.2 (L) 12.0 - 15.0 g/dL   HCT 32.9 (L) 36.0 - 46.0 %   MCV 92.9 80.0 - 100.0 fL   MCH 28.8 26.0 - 34.0 pg   MCHC 31.0 30.0 - 36.0 g/dL   RDW 16.3 (H) 11.5 - 15.5 %   Platelets 427 (H) 150 - 400 K/uL   nRBC 0.0 0.0 - 0.2 %  CMET  Result Value Ref Range   Sodium 137 135 - 145 mmol/L   Potassium 4.1 3.5 - 5.1 mmol/L   Chloride 97 (L) 98 - 111 mmol/L   CO2 28 22 - 32 mmol/L   Glucose, Bld 114 (H) 70 - 99 mg/dL   BUN 15 8 - 23 mg/dL   Creatinine, Ser 3.37 (H) 0.44 - 1.00 mg/dL   Calcium  8.9 8.9 - 10.3 mg/dL   Total Protein 7.8 6.5 - 8.1 g/dL   Albumin 2.9 (L) 3.5 - 5.0 g/dL   AST 12 (L) 15 - 41 U/L   ALT 7 0 - 44 U/L   Alkaline Phosphatase 72 38 - 126 U/L   Total Bilirubin 1.1 0.3 - 1.2 mg/dL   GFR calc non Af Amer 14 (L) >60 mL/min   GFR calc Af Amer 16 (L) >60 mL/min   Anion gap 12 5 - 15    EKG EKG Interpretation  Date/Time:  Saturday May 13 2019 09:33:57 EST Ventricular Rate:  83 PR Interval:    QRS Duration: 83 QT Interval:  391 QTC Calculation: 460 R Axis:   24 Text Interpretation: Sinus rhythm Nonspecific T wave abnormality Baseline wander Confirmed by Lajean Saver [phone]) on 05/13/2019 9:56:07 AM   Radiology CT Chest Wo Contrast  Result Date: 05/13/2019 CLINICAL DATA:  Chest pain shortness  of breath. Chest pain with breathing. History of an thoracic aortic aneurysm repair. EXAM: CT CHEST WITHOUT CONTRAST TECHNIQUE: Multidetector CT imaging of the chest was performed following the standard protocol without IV contrast. COMPARISON:  04/03/2019 FINDINGS: Cardiovascular: Heart is for line enlarged. Small pericardial effusion. Changes from the pair of the ascending thoracic aortic aneurysm. Atherosclerotic calcifications are noted along the aortic arch and descending thoracic aorta, stable. Mediastinum/Nodes: Thyroid is unremarkable. No neck base, axillary, mediastinal or hilar masses or enlarged lymph nodes. Trachea and esophagus are unremarkable. Right internal jugular central venous catheter has its tip at the caval atrial junction. Lungs/Pleura: Small left pleural effusion. This is new since the prior CT. There is dependent opacity in the left lower lobe consistent with atelectasis. A component of pneumonia is not excluded. Are additional linear opacities noted in the right lower lobe and right middle and upper lobes consistent atelectasis, scarring or a combination. There is no evidence of pulmonary edema. No right pleural effusion. No pneumothorax. Upper  Abdomen: No acute abnormality. Musculoskeletal: No fracture or acute finding. No osteoblastic or osteolytic lesions. IMPRESSION: 1. Small left pleural effusion associated with left lower lobe dependent opacity, which is most likely atelectasis. Consider a component of pneumonia if there are consistent clinical findings. 2. Small pericardial effusion similar to the prior CT. 3. Stable changes from repair of an ascending thoracic aortic aneurysm. 4. Aortic atherosclerosis. Aortic Atherosclerosis (ICD10-I70.0). Electronically Signed   By: Lajean Manes M.D.   On: 05/13/2019 12:04   XR Chest Portable  Result Date: 05/13/2019 CLINICAL DATA:  [facility] from HD. Per EMS pt was about 1.5/4hr treatment when pt began to complain of sharp CP when she was breathing. EKG unremarkable. EXAM: PORTABLE CHEST 1 VIEW COMPARISON:  Chest radiograph 05/08/2019 FINDINGS: Stable cardiomediastinal contours with enlarged heart size. Status post median sternotomy. The left pleural effusion is decreased from prior. Consolidation at the left lung base could reflect atelectasis, infiltrate not excluded. The right lung is clear. No pneumothorax. Stable appearance of a right central venous catheter. No acute finding in the visualized skeleton. IMPRESSION: 1. Consolidative opacity at the left lung base could reflect atelectasis, infiltrate, or a combination. 2. No definite pleural effusion. Electronically Signed   By: Audie Pinto M.D.   On: 05/13/2019 10:48    Procedures Procedures (including critical care time)  Medications Ordered in ED Medications - No data to display  ED Course  I have reviewed the triage vital signs and the nursing notes.  Pertinent labs & imaging results that were available during my care of the patient were reviewed by me and considered in my medical decision making (see chart for details).    MDM Rules/Calculators/A&P     Iv ns. Continuous pulse ox and monitor. Morphine iv, zofran iv.    Stat labs and imaging. Ecg.   Reviewed nursing notes and prior charts for additional history.   Labs reviewed/interpreted by me - wbc normal, k normal. hgb stable.   CXR reviewed/interpreted by me - ?effusion left base, atel vs infil.  Given persistent symptoms, abn cxr, will get ct to further clarify left lower process. Given recent/newly on dialysis and ?whether renal failure is complete/permanent, will avoid iv contract. Ct without ordered.   Recheck pt comfortably. Currently rr 18, pulse ox 100%. Afebrile.   CT reviewed by me - LLL effusion/atelx. Small perica eff.   Patient reports these symptoms as being persistent ever since surgery and reports recently seeing her surgeon for  same.   Will tx pain/symptoms, and rec close f/u with her physician.   Pt request we touch base with her surgeon. Consulted CT. Discussed pt with Dr Servando Snare - he reviewed CT/cxr, indicates effusion small, no emergent thoracentesis today, indicates may d/c, and to have patient call office for follow up this week.   Recheck, hr 82, rr 18, pulse ox 100%, no increased wob. Pt denies fever, chills, or sweats. No increased cough.   Return precautions provided.       Final Clinical Impression(s) / ED Diagnoses Final diagnoses:  None    Rx / DC Orders ED Discharge Orders    None       Lajean Saver, MD 05/13/19 1507

## 2019-05-13 NOTE — ED Notes (Signed)
Got patient undress on the monitor did ekg shown To Dr French Ana patient is resting with family at bedside and call bell in reach

## 2019-05-13 NOTE — ED Triage Notes (Signed)
Pt arrives by [facility] from HD. Per EMS pt was about 1.5/4hr treatment when pt began to complain of sharp CP when she was breathing. EKG unremarkable. Pt states she was diagnosed with plural effusion but declined treatment at that time.  158/70 80 HR 100% RA 24 RR

## 2019-05-13 NOTE — ED Notes (Signed)
Pain 9/10

## 2019-05-13 NOTE — Discharge Instructions (Addendum)
It was our pleasure to provide your ER care today - we hope that you feel better.  You may take ultram as need for pain - no driving for the next 6 hours, or when taking pain medication.  We discussed your case with your surgoen/on-call doctor for your surgeon - he indicates for you to follow up with them in the office in the coming week - call office Monday AM to arrange appointment.   Return to ER right away if worse, new symptoms, fevers, increased trouble breathing, or other concern.

## 2019-05-14 ENCOUNTER — Telehealth: Payer: Self-pay | Admitting: *Deleted

## 2019-05-14 NOTE — Telephone Encounter (Signed)
ED CM received a call from the patient's niece. She requested that the prescription sent to [facility] yesterday be sent to [facility] on [facility] today. [facility] was closed by the time they were able to get there yesterday and is closed today. She states the patient is in pain.   ED CM messaged the patient's EDP with the above request.

## 2019-05-14 NOTE — Telephone Encounter (Signed)
ED CM spoke with Dr. Ashok Cordia. Called in the prescription for Ultram to [facility] [phone]. The pharmacy is awaiting a call back with the EDP's DEA number. Message sent to Dr. Ashok Cordia.   ED CM called [facility] and provided the additional information to the pharmacist, [place].   CM called the patient's niece and made her aware the prescription will be able to pick up today. She has the telephone number for [facility] and will call them before she goes to pick it up.

## 2019-05-15 ENCOUNTER — Other Ambulatory Visit: Payer: Self-pay

## 2019-05-15 DIAGNOSIS — Z95828 Presence of other vascular implants and grafts: Secondary | ICD-10-CM

## 2019-05-18 ENCOUNTER — Emergency Department (HOSPITAL_COMMUNITY): Payer: Medicare Other

## 2019-05-18 ENCOUNTER — Inpatient Hospital Stay (HOSPITAL_COMMUNITY)
Admission: EM | Admit: 2019-05-18 | Discharge: 2019-05-24 | DRG: 205 | Disposition: A | Discharge status: Home Health | Payer: Medicare Other | Source: Ambulatory Visit | Attending: Internal Medicine | Admitting: Internal Medicine

## 2019-05-18 ENCOUNTER — Encounter (HOSPITAL_COMMUNITY): Payer: Self-pay | Admitting: Internal Medicine

## 2019-05-18 DIAGNOSIS — F1411 Cocaine abuse, in remission: Secondary | ICD-10-CM | POA: Diagnosis present

## 2019-05-18 DIAGNOSIS — G92 Toxic encephalopathy: Secondary | ICD-10-CM | POA: Diagnosis not present

## 2019-05-18 DIAGNOSIS — Z91013 Allergy to seafood: Secondary | ICD-10-CM | POA: Diagnosis not present

## 2019-05-18 DIAGNOSIS — Z853 Personal history of malignant neoplasm of breast: Secondary | ICD-10-CM

## 2019-05-18 DIAGNOSIS — I6340 Cerebral infarction due to embolism of unspecified cerebral artery: Secondary | ICD-10-CM | POA: Diagnosis not present

## 2019-05-18 DIAGNOSIS — I4891 Unspecified atrial fibrillation: Secondary | ICD-10-CM | POA: Diagnosis present

## 2019-05-18 DIAGNOSIS — I6330 Cerebral infarction due to thrombosis of unspecified cerebral artery: Secondary | ICD-10-CM | POA: Insufficient documentation

## 2019-05-18 DIAGNOSIS — R000 Tachycardia, unspecified: Secondary | ICD-10-CM | POA: Diagnosis present

## 2019-05-18 DIAGNOSIS — J9811 Atelectasis: Principal | ICD-10-CM | POA: Diagnosis present

## 2019-05-18 DIAGNOSIS — Z9012 Acquired absence of left breast and nipple: Secondary | ICD-10-CM

## 2019-05-18 DIAGNOSIS — D631 Anemia in chronic kidney disease: Secondary | ICD-10-CM | POA: Diagnosis present

## 2019-05-18 DIAGNOSIS — I7100 Dissection of unspecified site of aorta: Secondary | ICD-10-CM | POA: Diagnosis present

## 2019-05-18 DIAGNOSIS — N186 End stage renal disease: Secondary | ICD-10-CM | POA: Diagnosis present

## 2019-05-18 DIAGNOSIS — Z981 Arthrodesis status: Secondary | ICD-10-CM

## 2019-05-18 DIAGNOSIS — E785 Hyperlipidemia, unspecified: Secondary | ICD-10-CM | POA: Diagnosis present

## 2019-05-18 DIAGNOSIS — R0602 Shortness of breath: Secondary | ICD-10-CM | POA: Diagnosis not present

## 2019-05-18 DIAGNOSIS — M1990 Unspecified osteoarthritis, unspecified site: Secondary | ICD-10-CM | POA: Diagnosis present

## 2019-05-18 DIAGNOSIS — Z7982 Long term (current) use of aspirin: Secondary | ICD-10-CM

## 2019-05-18 DIAGNOSIS — Z20828 Contact with and (suspected) exposure to other viral communicable diseases: Secondary | ICD-10-CM | POA: Diagnosis present

## 2019-05-18 DIAGNOSIS — I361 Nonrheumatic tricuspid (valve) insufficiency: Secondary | ICD-10-CM | POA: Diagnosis not present

## 2019-05-18 DIAGNOSIS — B1920 Unspecified viral hepatitis C without hepatic coma: Secondary | ICD-10-CM | POA: Diagnosis present

## 2019-05-18 DIAGNOSIS — N2581 Secondary hyperparathyroidism of renal origin: Secondary | ICD-10-CM | POA: Diagnosis not present

## 2019-05-18 DIAGNOSIS — Z8679 Personal history of other diseases of the circulatory system: Secondary | ICD-10-CM | POA: Diagnosis not present

## 2019-05-18 DIAGNOSIS — F17210 Nicotine dependence, cigarettes, uncomplicated: Secondary | ICD-10-CM | POA: Diagnosis present

## 2019-05-18 DIAGNOSIS — I63412 Cerebral infarction due to embolism of left middle cerebral artery: Secondary | ICD-10-CM | POA: Diagnosis not present

## 2019-05-18 DIAGNOSIS — J90 Pleural effusion, not elsewhere classified: Secondary | ICD-10-CM | POA: Diagnosis present

## 2019-05-18 DIAGNOSIS — Z888 Allergy status to other drugs, medicaments and biological substances status: Secondary | ICD-10-CM

## 2019-05-18 DIAGNOSIS — I248 Other forms of acute ischemic heart disease: Secondary | ICD-10-CM | POA: Diagnosis present

## 2019-05-18 DIAGNOSIS — Z885 Allergy status to narcotic agent status: Secondary | ICD-10-CM | POA: Diagnosis not present

## 2019-05-18 DIAGNOSIS — Z992 Dependence on renal dialysis: Secondary | ICD-10-CM | POA: Diagnosis not present

## 2019-05-18 DIAGNOSIS — R9401 Abnormal electroencephalogram [EEG]: Secondary | ICD-10-CM | POA: Diagnosis present

## 2019-05-18 DIAGNOSIS — D696 Thrombocytopenia, unspecified: Secondary | ICD-10-CM | POA: Diagnosis present

## 2019-05-18 DIAGNOSIS — Z800 Family history of malignant neoplasm of digestive organs: Secondary | ICD-10-CM

## 2019-05-18 DIAGNOSIS — I120 Hypertensive chronic kidney disease with stage 5 chronic kidney disease or end stage renal disease: Secondary | ICD-10-CM | POA: Diagnosis present

## 2019-05-18 DIAGNOSIS — Z923 Personal history of irradiation: Secondary | ICD-10-CM

## 2019-05-18 DIAGNOSIS — Z681 Body mass index (BMI) 19 or less, adult: Secondary | ICD-10-CM | POA: Diagnosis not present

## 2019-05-18 DIAGNOSIS — I480 Paroxysmal atrial fibrillation: Secondary | ICD-10-CM | POA: Diagnosis present

## 2019-05-18 DIAGNOSIS — T420X5A Adverse effect of hydantoin derivatives, initial encounter: Secondary | ICD-10-CM | POA: Diagnosis not present

## 2019-05-18 DIAGNOSIS — Z8249 Family history of ischemic heart disease and other diseases of the circulatory system: Secondary | ICD-10-CM

## 2019-05-18 DIAGNOSIS — N179 Acute kidney failure, unspecified: Secondary | ICD-10-CM | POA: Diagnosis present

## 2019-05-18 DIAGNOSIS — R0789 Other chest pain: Secondary | ICD-10-CM

## 2019-05-18 DIAGNOSIS — R278 Other lack of coordination: Secondary | ICD-10-CM | POA: Diagnosis not present

## 2019-05-18 DIAGNOSIS — E440 Moderate protein-calorie malnutrition: Secondary | ICD-10-CM | POA: Insufficient documentation

## 2019-05-18 DIAGNOSIS — Z886 Allergy status to analgesic agent status: Secondary | ICD-10-CM | POA: Diagnosis not present

## 2019-05-18 DIAGNOSIS — R778 Other specified abnormalities of plasma proteins: Secondary | ICD-10-CM | POA: Diagnosis present

## 2019-05-18 DIAGNOSIS — Z95828 Presence of other vascular implants and grafts: Secondary | ICD-10-CM | POA: Diagnosis not present

## 2019-05-18 DIAGNOSIS — I10 Essential (primary) hypertension: Secondary | ICD-10-CM | POA: Diagnosis present

## 2019-05-18 DIAGNOSIS — R110 Nausea: Secondary | ICD-10-CM | POA: Diagnosis not present

## 2019-05-18 DIAGNOSIS — T424X5A Adverse effect of benzodiazepines, initial encounter: Secondary | ICD-10-CM | POA: Diagnosis not present

## 2019-05-18 DIAGNOSIS — Z833 Family history of diabetes mellitus: Secondary | ICD-10-CM

## 2019-05-18 DIAGNOSIS — Z79899 Other long term (current) drug therapy: Secondary | ICD-10-CM

## 2019-05-18 DIAGNOSIS — M48061 Spinal stenosis, lumbar region without neurogenic claudication: Secondary | ICD-10-CM | POA: Diagnosis present

## 2019-05-18 DIAGNOSIS — K219 Gastro-esophageal reflux disease without esophagitis: Secondary | ICD-10-CM | POA: Diagnosis present

## 2019-05-18 DIAGNOSIS — Z820 Family history of epilepsy and other diseases of the nervous system: Secondary | ICD-10-CM

## 2019-05-18 HISTORY — DX: Pleural effusion, not elsewhere classified: J90

## 2019-05-18 HISTORY — DX: Unspecified atrial fibrillation: I48.91

## 2019-05-18 LAB — COMPREHENSIVE METABOLIC PANEL
ALT: 8 U/L (ref 0–44)
AST: 14 U/L — ABNORMAL LOW (ref 15–41)
Albumin: 2.6 g/dL — ABNORMAL LOW (ref 3.5–5.0)
Alkaline Phosphatase: 73 U/L (ref 38–126)
Anion gap: 19 — ABNORMAL HIGH (ref 5–15)
BUN: 5 mg/dL — ABNORMAL LOW (ref 8–23)
CO2: 21 mmol/L — ABNORMAL LOW (ref 22–32)
Calcium: 8.6 mg/dL — ABNORMAL LOW (ref 8.9–10.3)
Chloride: 95 mmol/L — ABNORMAL LOW (ref 98–111)
Creatinine, Ser: 2.2 mg/dL — ABNORMAL HIGH (ref 0.44–1.00)
GFR calc Af Amer: 26 mL/min — ABNORMAL LOW (ref >60)
GFR calc non Af Amer: 23 mL/min — ABNORMAL LOW (ref >60)
Glucose, Bld: 93 mg/dL (ref 70–99)
Potassium: 4.6 mmol/L (ref 3.5–5.1)
Sodium: 135 mmol/L (ref 135–145)
Total Bilirubin: 1 mg/dL (ref 0.3–1.2)
Total Protein: 7.8 g/dL (ref 6.5–8.1)

## 2019-05-18 LAB — CBC
HCT: 35.1 % — ABNORMAL LOW (ref 36.0–46.0)
Hemoglobin: 10.8 g/dL — ABNORMAL LOW (ref 12.0–15.0)
MCH: 28.2 pg (ref 26.0–34.0)
MCHC: 30.8 g/dL (ref 30.0–36.0)
MCV: 91.6 fL (ref 80.0–100.0)
Platelets: 683 10*3/uL — ABNORMAL HIGH (ref 150–400)
RBC: 3.83 MIL/uL — ABNORMAL LOW (ref 3.87–5.11)
RDW: 16.7 % — ABNORMAL HIGH (ref 11.5–15.5)
WBC: 11.2 10*3/uL — ABNORMAL HIGH (ref 4.0–10.5)
nRBC: 0 % (ref 0.0–0.2)

## 2019-05-18 LAB — TROPONIN I (HIGH SENSITIVITY)
Troponin I (High Sensitivity): 40 ng/L — ABNORMAL HIGH (ref <18)
Troponin I (High Sensitivity): 39 ng/L — ABNORMAL HIGH (ref <18)

## 2019-05-18 LAB — SARS CORONAVIRUS 2 (TAT 6-24 HRS): SARS Coronavirus 2: NEGATIVE

## 2019-05-18 MED ORDER — HEPARIN SODIUM (PORCINE) 5000 UNIT/ML IJ SOLN
5000.0000 [IU] | Freq: Three times a day (TID) | INTRAMUSCULAR | Status: DC
  Administered 2019-05-19 – 2019-05-24 (×14): 5000 [IU] via SUBCUTANEOUS
  Filled 2019-05-18 (×17): qty 1

## 2019-05-18 MED ORDER — DILTIAZEM HCL-DEXTROSE 125-5 MG/125ML-% IV SOLN (PREMIX)
5.0000 mg/h | INTRAVENOUS | Status: DC
  Administered 2019-05-18: 5 mg/h via INTRAVENOUS
  Filled 2019-05-18 (×2): qty 125

## 2019-05-18 MED ORDER — LORAZEPAM 2 MG/ML IJ SOLN
1.0000 mg | Freq: Once | INTRAMUSCULAR | Status: AC
  Administered 2019-05-18: 1 mg via INTRAVENOUS
  Filled 2019-05-18: qty 1

## 2019-05-18 MED ORDER — ONDANSETRON HCL 4 MG PO TABS
4.0000 mg | ORAL_TABLET | Freq: Four times a day (QID) | ORAL | Status: DC | PRN
  Administered 2019-05-21: 4 mg via ORAL
  Filled 2019-05-18: qty 1

## 2019-05-18 MED ORDER — POLYETHYLENE GLYCOL 3350 17 G PO PACK
17.0000 g | PACK | Freq: Every day | ORAL | Status: DC | PRN
  Administered 2019-05-21: 17 g via ORAL
  Filled 2019-05-18: qty 1

## 2019-05-18 MED ORDER — HYDRALAZINE HCL 25 MG PO TABS
25.0000 mg | ORAL_TABLET | Freq: Three times a day (TID) | ORAL | Status: DC
  Administered 2019-05-19 – 2019-05-24 (×14): 25 mg via ORAL
  Filled 2019-05-18 (×17): qty 1

## 2019-05-18 MED ORDER — SODIUM CHLORIDE 0.9 % IV BOLUS
1000.0000 mL | Freq: Once | INTRAVENOUS | Status: AC
  Administered 2019-05-18: 1000 mL via INTRAVENOUS

## 2019-05-18 MED ORDER — FAMOTIDINE 20 MG PO TABS
10.0000 mg | ORAL_TABLET | Freq: Every day | ORAL | Status: DC
  Administered 2019-05-18 – 2019-05-23 (×6): 10 mg via ORAL
  Filled 2019-05-18 (×6): qty 1

## 2019-05-18 MED ORDER — PREGABALIN 75 MG PO CAPS
75.0000 mg | ORAL_CAPSULE | Freq: Two times a day (BID) | ORAL | Status: DC
  Administered 2019-05-18 – 2019-05-19 (×3): 75 mg via ORAL
  Filled 2019-05-18 (×4): qty 1

## 2019-05-18 MED ORDER — ONDANSETRON HCL 4 MG/2ML IJ SOLN
4.0000 mg | Freq: Once | INTRAMUSCULAR | Status: AC
  Administered 2019-05-18: 4 mg via INTRAVENOUS
  Filled 2019-05-18: qty 2

## 2019-05-18 MED ORDER — ACETAMINOPHEN 650 MG RE SUPP: 650.0000 mg | Freq: Four times a day (QID) | RECTAL | Status: DC | PRN

## 2019-05-18 MED ORDER — ACETAMINOPHEN 325 MG PO TABS: 650.0000 mg | ORAL_TABLET | Freq: Four times a day (QID) | ORAL | Status: DC | PRN

## 2019-05-18 MED ORDER — OXYCODONE HCL 5 MG PO TABS
5.0000 mg | ORAL_TABLET | ORAL | Status: DC | PRN
  Administered 2019-05-18 – 2019-05-19 (×2): 5 mg via ORAL
  Filled 2019-05-18 (×3): qty 1

## 2019-05-18 MED ORDER — MORPHINE SULFATE (PF) 2 MG/ML IV SOLN
2.0000 mg | Freq: Once | INTRAVENOUS | Status: AC
  Administered 2019-05-18: 2 mg via INTRAVENOUS
  Filled 2019-05-18: qty 1

## 2019-05-18 MED ORDER — MORPHINE SULFATE (PF) 4 MG/ML IV SOLN
4.0000 mg | Freq: Once | INTRAVENOUS | Status: AC
  Administered 2019-05-18: 4 mg via INTRAVENOUS
  Filled 2019-05-18: qty 1

## 2019-05-18 MED ORDER — DILTIAZEM LOAD VIA INFUSION
10.0000 mg | Freq: Once | INTRAVENOUS | Status: AC
  Administered 2019-05-18: 10 mg via INTRAVENOUS
  Filled 2019-05-18: qty 10

## 2019-05-18 MED ORDER — ONDANSETRON HCL 4 MG/2ML IJ SOLN
4.0000 mg | Freq: Four times a day (QID) | INTRAMUSCULAR | Status: DC | PRN
  Administered 2019-05-23: 4 mg via INTRAVENOUS
  Filled 2019-05-18: qty 2

## 2019-05-18 MED ORDER — IOHEXOL 350 MG/ML SOLN
100.0000 mL | Freq: Once | INTRAVENOUS | Status: AC | PRN
  Administered 2019-05-18: 100 mL via INTRAVENOUS

## 2019-05-18 MED ORDER — METOPROLOL TARTRATE 50 MG PO TABS
50.0000 mg | ORAL_TABLET | Freq: Two times a day (BID) | ORAL | Status: DC
  Administered 2019-05-19 – 2019-05-24 (×10): 50 mg via ORAL
  Filled 2019-05-18 (×11): qty 1

## 2019-05-18 MED ORDER — ASPIRIN EC 325 MG PO TBEC
325.0000 mg | DELAYED_RELEASE_TABLET | Freq: Every day | ORAL | Status: DC
  Administered 2019-05-19 – 2019-05-24 (×6): 325 mg via ORAL
  Filled 2019-05-18 (×6): qty 1

## 2019-05-18 MED ORDER — AMLODIPINE BESYLATE 10 MG PO TABS: 10.0000 mg | ORAL_TABLET | Freq: Every day | ORAL | Status: DC

## 2019-05-18 MED ORDER — ALBUTEROL SULFATE (2.5 MG/3ML) 0.083% IN NEBU: 2.5000 mg | INHALATION_SOLUTION | Freq: Four times a day (QID) | RESPIRATORY_TRACT | Status: DC | PRN

## 2019-05-18 MED ORDER — TRAMADOL HCL 50 MG PO TABS
50.0000 mg | ORAL_TABLET | Freq: Two times a day (BID) | ORAL | Status: DC | PRN
  Filled 2019-05-18: qty 1

## 2019-05-18 NOTE — ED Notes (Addendum)
Report given to [facility]. Will take patient up after shift change.

## 2019-05-18 NOTE — ED Provider Notes (Signed)
[facility] EMERGENCY DEPARTMENT Provider Note   CSN: [id number] Arrival date & time: 05/18/19  1055     History Chief Complaint  Patient presents with  . Chest Pain    Monique Henderson is a 68 y.o. female.  HPI 68 year old female status post ascending aortic dissection repair with graft in early November who has been on dialysis since that time presents today with worsening sharp left-sided chest pain and dyspnea.  She describes the pain as sharp and radiating from the left lower ribs up into the axilla.  She states it is sharp and 10 out of 10 it has been worsening over the past 2 days although present at some level since surgery.  Is worse with inspiration and she feels significantly dyspneic.  She denies any fever, chills, or Covid exposure.  He comes here today from dialysis.  She was seen here 2 days ago for similar complaints at that time had chest x-Kennede Lusk and CT of the chest which was significant for small pericardial effusion, changes from the ascending thoracic aortic aneurysm repair, left pleural effusion new since prior CT with left lower lobe dependent opacity most likely atelectasis.  Patient states that she has continued pain a 10 out of 10  Which is worse during dialysis.  Is currently 6 out of 10.  She was instructed to have called to her CT surgeon office for follow-up this week which has not occurred at this time.  She presents today directly from dialysis which was completed.  Patient states that she is out of her oxycodone and that she has been given other pain medicine that does not work.  Past Medical History:  Diagnosis Date  . Atelectasis 2002   Bilateral  . Bone spur 2008   Right calcaneal foot spur  . Breast cancer ([place]) 2004   Ductal carcinoma in situ of the left breast; S/P left partial mastectomy 02/26/2003; S/P re-excision of cranial and lateral margins11/18/2004.radiation  . Breast cancer ([place]) 09/21/2012   right breast/ last radiation  treatment 03/22/2013  . Chronic kidney disease, stage IV (severe) ([place]) 10/10/2007  . 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 Autumn Messing; S/P re-excision of superior and inferior margins 10/27/2012.   Marland Kitchen GERD (gastroesophageal reflux disease)   . 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 Hot flashes   . Hx of radiation therapy 2005   left breast  . Hx of radiation therapy 01/11/13- 03/22/13   right breast 5760 cGy 30 sessions  . Hypertension   . Low back pain   . Lumbar spinal stenosis    S/P lumbar decompressive laminectomy, fusion and plating for lumbar spinal stensosis  . Normocytic anemia    With thrombocytosis  . Osteoarthritis   . Personal history of radiation therapy   . Right ureteral stone 2002  . Shortness of breath    from pain  . Uterine fibroid   . Wears dentures    top    Patient Active Problem List   Diagnosis Date Noted  . S/P aortic aneurysm repair 04/07/2019  . Aortic dissection ([place]) 04/04/2019  . Dissection of aorta ([place]) 04/03/2019  . Non compliance with medical treatment 12/04/2017  . Chronic kidney disease (CKD) stage G3b/A2, moderately decreased glomerular filtration rate (GFR) between 30-44 mL/min/1.73 square meter and albuminuria creatinine ratio between 30-299 mg/g 07/19/2017  . Chronic obstructive lung disease ([place]) 01/16/2017  .  Chronic low back pain 06/22/2016  . Insomnia 03/14/2015  . S/P lumbar spinal fusion 01/18/2014  . Tobacco use disorder 04/19/2009  . Essential hypertension 09/16/2006  . GERD 09/16/2006    Past Surgical History:  Procedure Laterality Date  . ANTERIOR LAT LUMBAR FUSION N/A 01/18/2014   Procedure: ANTERIOR LATERAL LUMBAR FUSION LUMBAR TWO-THREE;  Surgeon: Eustace Moore, MD;  Location: [facility];  Service: Neurosurgery;  Laterality: N/A;  . ANTERIOR LUMBAR FUSION  01/18/2014  . AV FISTULA PLACEMENT Left 04/20/2019   Procedure:  ARTERIOVENOUS (AV) FISTULA CREATION;  Surgeon: [name], MD;  Location: [facility];  Service: Vascular;  Laterality: Left;  . BACK SURGERY    . BREAST LUMPECTOMY Left 01/2003  . BREAST LUMPECTOMY Right 2014  . BREAST LUMPECTOMY WITH NEEDLE LOCALIZATION AND AXILLARY SENTINEL LYMPH NODE BX Right 10/13/2012   Procedure: BREAST LUMPECTOMY WITH NEEDLE LOCALIZATION;  Surgeon: [name], MD;  Location: [facility];  Service: General;  Laterality: Right;  Right breast wire localized lumpectomy  . INSERTION OF DIALYSIS CATHETER Right 04/20/2019   Procedure: INSERTION OF DIALYSIS CATHETER, right internal jugular;  Surgeon: [name], MD;  Location: [facility];  Service: Vascular;  Laterality: Right;  . LAMINECTOMY  05/27/2009   Lumbar decompressive laminectomy, fusion and plating for lumbar spinal stensosis  . LUMBAR LAMINECTOMY/DECOMPRESSION MICRODISCECTOMY Left 03/23/2013   Procedure: LUMBAR LAMINECTOMY/DECOMPRESSION MICRODISCECTOMY 1 LEVEL;  Surgeon: Eustace Moore, MD;  Location: [facility];  Service: Neurosurgery;  Laterality: Left;  LUMBAR LAMINECTOMY/DECOMPRESSION MICRODISCECTOMY 1 LEVEL  . MASTECTOMY, PARTIAL Left 02/26/2003   ; S/P re-excision of cranial and lateral margins 04/19/2003.   Marland Kitchen RE-EXCISION OF BREAST CANCER,SUPERIOR MARGINS Right 10/27/2012   Procedure: RE-EXCISION OF BREAST CANCER,SUPERIOR and inferior MARGINS;  Surgeon: [name], MD;  Location: [facility];  Service: General;  Laterality: Right;  . RE-EXCISION OF BREAST LUMPECTOMY Left 04/2003  . TEE WITHOUT CARDIOVERSION N/A 04/04/2019   Procedure: Transesophageal Echocardiogram (Tee);  Surgeon: Wonda Olds, MD;  Location: [facility];  Service: Open Heart Surgery;  Laterality: N/A;  . THORACIC AORTIC ANEURYSM REPAIR N/A 04/04/2019   Procedure: THORACIC ASCENDING ANEURYSM REPAIR (AAA)  USING 28 MM X 30 CM HEMASHIELD PLATINUM VASCULAR GRAFT;  Surgeon: Wonda Olds, MD;  Location: [facility];   Service: Open Heart Surgery;  Laterality: N/A;     OB History    Gravida  2   Para  2   Term  1   Preterm  1   AB      Living        SAB      TAB      Ectopic      Multiple      Live Births              Family History  Problem Relation Age of Onset  . Colon cancer Mother 9  . Hypertension Mother   . Diabetes Sister 87  . Hypertension Sister   . Diabetes Brother   . Hypertension Brother   . Diabetes Brother   . Hypertension Brother   . Kidney disease Son        On dialysis  . Hypertension Son   . Diabetes Son   . Multiple sclerosis Son   . Bone cancer Sister 7  . Breast cancer Neg Hx   . Cervical cancer Neg Hx     Social History   Tobacco Use  . Smoking  status: Current Some Day Smoker    Packs/day: 0.25    Years: 44.00    Pack years: 11.00    Types: Cigarettes  . Smokeless tobacco: Never Used  . Tobacco comment: smoking less  Substance Use Topics  . Alcohol use: Yes    Alcohol/week: 2.0 standard drinks    Types: 2 Cans of beer per week    Comment:  "couple beers q weekend"  . Drug use: Yes    Types: Cocaine    Comment: "stopped back in the 1980's"    Home Medications Prior to Admission medications   Medication Sig Start Date End Date Taking? Authorizing Provider  albuterol (VENTOLIN HFA) 108 (90 Base) MCG/ACT inhaler Inhale 2 puffs into the lungs every 6 (six) hours as needed for wheezing or shortness of breath. 02/02/19   [name], MD  amiodarone (PACERONE) 200 MG tablet Take 1 tablet (200 mg total) by mouth daily. For 2 weeks then stop. 04/21/19   Nani Skillern, PA-C  amLODipine (NORVASC) 10 MG tablet TAKE 1 TABLET (10 MG TOTAL) BY MOUTH DAILY. 09/20/18   [name], MD  aspirin EC 325 MG tablet Take 1 tablet (325 mg total) by mouth daily. 04/21/19 04/20/20  Nani Skillern, PA-C  cloNIDine (CATAPRES - DOSED IN MG/24 HR) 0.3 mg/24hr patch Place 1 patch (0.3 mg total) onto the skin once a week. 04/22/19    Nani Skillern, PA-C  famotidine (PEPCID) 10 MG tablet Take 1 tablet (10 mg total) by mouth at bedtime. 05/04/18   [name], MD  hydrALAZINE (APRESOLINE) 25 MG tablet Take 1 tablet (25 mg total) by mouth every 8 (eight) hours. 04/21/19   Nani Skillern, PA-C  metoprolol tartrate (LOPRESSOR) 50 MG tablet Take 1 tablet (50 mg total) by mouth 2 (two) times daily. 04/21/19   Nani Skillern, PA-C  multivitamin (RENA-VIT) TABS tablet Take 1 tablet by mouth at bedtime. 04/21/19   Nani Skillern, PA-C  ondansetron (ZOFRAN) 4 MG tablet Take 1 tablet (4 mg total) by mouth every 8 (eight) hours as needed for nausea or vomiting. 04/22/19   Nani Skillern, PA-C  oxyCODONE (OXY IR/ROXICODONE) 5 MG immediate release tablet Take 1 tablet (5 mg total) by mouth every 4 (four) hours as needed for severe pain. 04/21/19   Nani Skillern, PA-C  pregabalin (LYRICA) 75 MG capsule TAKE 1 CAPSULE (75 MG TOTAL) BY MOUTH 2 (TWO) TIMES DAILY. 09/15/18   Forrest Moron, MD  traMADol (ULTRAM) 50 MG tablet Take 1 tablet (50 mg total) by mouth every 12 (twelve) hours as needed for moderate pain. 05/13/19   Lajean Saver, MD    Allergies    Shrimp [shellfish allergy], Bactroban [mupirocin], Vicodin [hydrocodone-acetaminophen], Lisinopril, and Tylenol [acetaminophen]  Review of Systems   Review of Systems  All other systems reviewed and are negative.   Physical Exam Updated Vital Signs BP (!) 132/110 (BP Location: Right Arm)   Temp 97.8 F (36.6 C) (Oral)   Resp (!) 29   SpO2 98%   Physical Exam Vitals and nursing note reviewed.  HENT:     Head: Normocephalic.  Eyes:     Pupils: Pupils are equal, round, and reactive to light.  Cardiovascular:     Rate and Rhythm: Regular rhythm. Tachycardia present.     Heart sounds: Normal heart sounds.  Pulmonary:     Effort: Pulmonary effort is normal.     Comments: Breath sounds decreased at bases  Tenderness to palpation left  lateral chest wall Chest:     Comments: Right side dialysis catheter chest wall No tenderness, erythema, or discharge noted Abdominal:     General: Bowel sounds are normal.     Palpations: Abdomen is soft.  Musculoskeletal:        General: Normal range of motion.     Cervical back: Normal range of motion.     Right lower leg: No tenderness. No edema.     Left lower leg: No edema.  Skin:    General: Skin is warm.     Capillary Refill: Capillary refill takes less than 2 seconds.  Neurological:     General: No focal deficit present.     Mental Status: She is alert.     ED Results / Procedures / Treatments   Labs (all labs ordered are listed, but only abnormal results are displayed) Labs Reviewed - No data to display  EKG EKG Interpretation  Date/Time:  Thursday May 18 2019 10:56:38 EST Ventricular Rate:  93 PR Interval:    QRS Duration: 74 QT Interval:  364 QTC Calculation: 453 R Axis:   75 Text Interpretation: Sinus rhythm Biatrial enlargement Abnormal R-wave progression, late transition Abnormal T, consider ischemia, lateral leads t wave inversion in v6 new from first prior Confirmed by Pattricia Boss [phone]) on 05/18/2019 11:07:01 AM   Radiology No results found.  Procedures Procedures (including critical care time)  Medications Ordered in ED Medications - No data to display  ED Course  I have reviewed the triage vital signs and the nursing notes.  Pertinent labs & imaging results that were available during my care of the patient were reviewed by me and considered in my medical decision making (see chart for details). 68 year old female status post recent cardiovascular surgery presents today with worsening left-sided chest pain.  She is recently had CT without contrast that showed left-sided effusion and small pericardial effusion.  Plan CT angio to evaluate for PE and abnormalities with recent surgery.  Patient has been on dialysis since surgery.  Although  there is some risk of worsening her kidney function, felt the CT angio is needed for evaluation today. I have discussed patient's care with Dr. Eulis Foster and he is assuming her care at this time    MDM Rules/Calculators/A&P                     final Clinical Impression(s) / ED Diagnoses Final diagnoses:  None    Rx / DC Orders ED Discharge Orders    None       Pattricia Boss, MD 05/18/19 1208

## 2019-05-18 NOTE — ED Provider Notes (Signed)
2:35 PM-call received from CT technician, who is concerned that the IV contrast, should not be given because she is only 2 months on dialysis, and the protocol requires greater than 6 months before use of IV contrast.  At this time the patient is alert, and uncomfortable, she complains of pain, in her bilateral chest, which is worse on the left side with deep breathing.  She states this pain has been present since her surgery, on her thoracic aortic aneurysm, 2 months ago.  She states that she ran out of her oxycodone, about 2 weeks ago.  She is using over-the-counter medications without relief.  She dialyzed, normally today.   Call request placed for nephrology to discuss appropriate treatment at this time, 2:40 PM.  4:35 PM-Dr. Wyvonnia Dusky to evaluate CT, and consider disposition in conjunction with CVTS.   Daleen Bo, MD 05/18/19 [phone]

## 2019-05-18 NOTE — ED Triage Notes (Addendum)
Pt presents from dialysis (completed, gets HD T-Th-Sat) for CP that has been present since AAA repair in Nov 2020. Evaluated 2 days ago for this pain, states that pain has not changed since 2 days ago when evaluated, describes as sharp and burning midsternal CP that radiated to shoulder blades. Pt reports 10/10 pain.  EMS exam: 97.35F, losing wt wince surgery, 114-130 irregular rhythm on EKG (pt denies h/o afib). ASA allergy in chart but denied by pt

## 2019-05-18 NOTE — ED Provider Notes (Signed)
CT scan shows no pulmonary embolism.  Does show left-sided pleural effusion which is slightly increased in size causing atelectasis.  Discussed with [place] on-call for Dr. Julien Girt of cardiothoracic surgery.  She states patient can be admitted for thoracentesis if she is symptomatic. She has follow-up on December 20 already.  Patient still having pain and feels short of breath.  She is intermittently tachycardic to the 1 teens and 120s which she states is due to pain.  She wishes to stay for thoracentesis tomorrow.  D/w hospitalist Dr. Mal Misty who will evaluate patient for admission.  Patient appeared to go into rapid atrial fibrillation at rates of 120s to 130s.  She is not certain if she has had this before.  She is started on IV Cardizem.  Ms.    CRITICAL CARE Performed by: Ezequiel Essex Total critical care time: 35 minutes Critical care time was exclusive of separately billable procedures and treating other patients. Critical care was necessary to treat or prevent imminent or life-threatening deterioration. Critical care was time spent personally by me on the following activities: development of treatment plan with patient and/or surrogate as well as nursing, discussions with consultants, evaluation of patient's response to treatment, examination of patient, obtaining history from patient or surrogate, ordering and performing treatments and interventions, ordering and review of laboratory studies, ordering and review of radiographic studies, pulse oximetry and re-evaluation of patient's condition.    EKG Interpretation  Date/Time:  Thursday May 18 2019 18:28:21 EST Ventricular Rate:  128 PR Interval:    QRS Duration: 84 QT Interval:  370 QTC Calculation: 508 R Axis:   50 Text Interpretation: Atrial flutter Low voltage, extremity leads Abnormal R-wave progression, late transition Abnormal T, consider ischemia, lateral leads Prolonged QT interval now atrial fibrillation Confirmed  by Ezequiel Essex [phone]) on 05/18/2019 6:39:38 PM         Ezequiel Essex, MD 05/18/19 2126

## 2019-05-18 NOTE — H&P (Addendum)
History and Physical:    Monique Henderson   IZT:[id number] DOB: [birth year] DOA: 05/18/2019  Referring MD/provider: Dr. Wyvonnia Dusky PCP: [name], MD   Patient coming from: Home  Chief Complaint: Shortness of breath  History of Present Illness:   Monique Henderson is an 68 y.o. 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  She was referred from the outpatient hemodialysis center to the emergency room because of complaints of shortness of breath.  She said she has been feeling short of breath and having chest pain since she had her heart surgery on 04/04/2019.  Chest pain is usually across her chest and this is nothing new since her surgery.  However, shortness of breath has progressively worsened.  Shortness of breath is worsened by mild exertion while talking.  Today, she went for hemodialysis and when she complained of shortness of breath they referred her to the emergency room.  No cough, wheezing, fever, chills   ED Course:  The patient was tachycardic, tachypneic in the emergency room.  Oxygen saturation is 96% on room air.  CTA chest did not show any pulmonary embolism and she was found to have increased left pleural effusion, now moderate in size. ED physician had consulted neurosurgery team and neurosurgery team recommended thoracentesis tomorrow.   ROS:   ROS all other systems reviewed were negative  Past Medical History:   Past Medical History:  Diagnosis Date  . Atelectasis 2002   Bilateral  . Bone spur 2008   Right calcaneal foot spur  . Breast cancer ([place]) 2004   Ductal carcinoma in situ of the left breast; S/P left partial mastectomy 02/26/2003; S/P re-excision of cranial and lateral margins11/18/2004.radiation  . Breast cancer  ([place]) 09/21/2012   right breast/ last radiation treatment 03/22/2013  . Chronic kidney disease, stage IV (severe) ([place]) 10/10/2007  . 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 Autumn Messing; S/P re-excision of superior and inferior margins 10/27/2012.   Marland Kitchen GERD (gastroesophageal reflux disease)   . 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 Hot flashes   . Hx of radiation therapy 2005   left breast  . Hx of radiation therapy 01/11/13- 03/22/13   right breast 5760 cGy 30 sessions  . Hypertension   . Low back pain   . Lumbar spinal stenosis    S/P lumbar decompressive laminectomy, fusion and plating for lumbar spinal stensosis  . Normocytic anemia    With thrombocytosis  . Osteoarthritis   . Personal history of radiation therapy   . Right ureteral stone 2002  . Shortness of breath    from pain  . Uterine fibroid   . Wears dentures    top    Past Surgical History:   Past Surgical History:  Procedure Laterality Date  . ANTERIOR LAT LUMBAR FUSION N/A 01/18/2014   Procedure: ANTERIOR LATERAL LUMBAR FUSION LUMBAR TWO-THREE;  Surgeon: Eustace Moore, MD;  Location: [place] [facility];  Service: Neurosurgery;  Laterality: N/A;  . ANTERIOR LUMBAR FUSION  01/18/2014  . AV FISTULA PLACEMENT Left 04/20/2019   Procedure: ARTERIOVENOUS (AV) FISTULA CREATION;  Surgeon: [name], MD;  Location: [place];  Service: Vascular;  Laterality: Left;  . BACK SURGERY    .  BREAST LUMPECTOMY Left 01/2003  . BREAST LUMPECTOMY Right 2014  . BREAST LUMPECTOMY WITH NEEDLE LOCALIZATION AND AXILLARY SENTINEL LYMPH NODE BX Right 10/13/2012   Procedure: BREAST LUMPECTOMY WITH NEEDLE LOCALIZATION;  Surgeon: [name], MD;  Location: [place];  Service: General;  Laterality: Right;  Right breast wire localized lumpectomy  . INSERTION OF DIALYSIS CATHETER Right 04/20/2019   Procedure: INSERTION OF DIALYSIS  CATHETER, right internal jugular;  Surgeon: [name], MD;  Location: [place];  Service: Vascular;  Laterality: Right;  . LAMINECTOMY  05/27/2009   Lumbar decompressive laminectomy, fusion and plating for lumbar spinal stensosis  . LUMBAR LAMINECTOMY/DECOMPRESSION MICRODISCECTOMY Left 03/23/2013   Procedure: LUMBAR LAMINECTOMY/DECOMPRESSION MICRODISCECTOMY 1 LEVEL;  Surgeon: Eustace Moore, MD;  Location: [place] [facility];  Service: Neurosurgery;  Laterality: Left;  LUMBAR LAMINECTOMY/DECOMPRESSION MICRODISCECTOMY 1 LEVEL  . MASTECTOMY, PARTIAL Left 02/26/2003   ; S/P re-excision of cranial and lateral margins 04/19/2003.   Marland Kitchen RE-EXCISION OF BREAST CANCER,SUPERIOR MARGINS Right 10/27/2012   Procedure: RE-EXCISION OF BREAST CANCER,SUPERIOR and inferior MARGINS;  Surgeon: [name], MD;  Location: [place];  Service: General;  Laterality: Right;  . RE-EXCISION OF BREAST LUMPECTOMY Left 04/2003  . TEE WITHOUT CARDIOVERSION N/A 04/04/2019   Procedure: Transesophageal Echocardiogram (Tee);  Surgeon: Wonda Olds, MD;  Location: [place];  Service: Open Heart Surgery;  Laterality: N/A;  . THORACIC AORTIC ANEURYSM REPAIR N/A 04/04/2019   Procedure: THORACIC ASCENDING ANEURYSM REPAIR (AAA)  USING 28 MM X 30 CM HEMASHIELD PLATINUM VASCULAR GRAFT;  Surgeon: Wonda Olds, MD;  Location[facility];  Service: Open Heart Surgery;  Laterality: N/A;    Social History:   Social History   Socioeconomic History  . Marital status: Single    Spouse name: Not on file  . Number of children: Not on file  . Years of education: Not on file  . Highest education level: Not on file  Occupational History  . Not on file  Tobacco Use  . Smoking status: Current Some Day Smoker    Packs/day: 0.25    Years: 44.00    Pack years: 11.00    Types: Cigarettes  . Smokeless tobacco: Never Used  . Tobacco comment: smoking less  Substance and Sexual Activity  . Alcohol use: Yes    Alcohol/week: 2.0 standard  drinks    Types: 2 Cans of beer per week    Comment:  "couple beers q weekend"  . Drug use: Yes    Types: Cocaine    Comment: "stopped back in the 1980's"  . Sexual activity: Yes    Birth control/protection: Post-menopausal  Other Topics Concern  . Not on file  Social History Narrative  . Not on file   Social Determinants of Health   Financial Resource Strain:   . Difficulty of Paying Living Expenses: Not on file  Food Insecurity:   . Worried About Charity fundraiser in the Last Year: Not on file  . Ran Out of Food in the Last Year: Not on file  Transportation Needs:   . Lack of Transportation (Medical): Not on file  . Lack of Transportation (Non-Medical): Not on file  Physical Activity:   . Days of Exercise per Week: Not on file  . Minutes of Exercise per Session: Not on file  Stress:   . Feeling of Stress : Not on file  Social Connections:   . Frequency of Communication with Friends and Family: Not on  file  . Frequency of Social Gatherings with Friends and Family: Not on file  . Attends Religious Services: Not on file  . Active Member of Clubs or Organizations: Not on file  . Attends Archivist Meetings: Not on file  . Marital Status: Not on file  Intimate Partner Violence:   . Fear of Current or Ex-Partner: Not on file  . Emotionally Abused: Not on file  . Physically Abused: Not on file  . Sexually Abused: Not on file    Allergies   Shrimp [shellfish allergy], Bactroban [mupirocin], Vicodin [hydrocodone-acetaminophen], Lisinopril, and Tylenol [acetaminophen]  Family history:   Family History  Problem Relation Age of Onset  . Colon cancer Mother 65  . Hypertension Mother   . Diabetes Sister 64  . Hypertension Sister   . Diabetes Brother   . Hypertension Brother   . Diabetes Brother   . Hypertension Brother   . Kidney disease Son        On dialysis  . Hypertension Son   . Diabetes Son   . Multiple sclerosis Son   . Bone cancer Sister 59  .  Breast cancer Neg Hx   . Cervical cancer Neg Hx     Current Medications:   Prior to Admission medications   Medication Sig Start Date End Date Taking? Authorizing Provider  albuterol (VENTOLIN HFA) 108 (90 Base) MCG/ACT inhaler Inhale 2 puffs into the lungs every 6 (six) hours as needed for wheezing or shortness of breath. 02/02/19   [name], MD  amiodarone (PACERONE) 200 MG tablet Take 1 tablet (200 mg total) by mouth daily. For 2 weeks then stop. 04/21/19   Nani Skillern, PA-C  amLODipine (NORVASC) 10 MG tablet TAKE 1 TABLET (10 MG TOTAL) BY MOUTH DAILY. 09/20/18   [name], MD  aspirin EC 325 MG tablet Take 1 tablet (325 mg total) by mouth daily. 04/21/19 04/20/20  Nani Skillern, PA-C  cloNIDine (CATAPRES - DOSED IN MG/24 HR) 0.3 mg/24hr patch Place 1 patch (0.3 mg total) onto the skin once a week. 04/22/19   Nani Skillern, PA-C  famotidine (PEPCID) 10 MG tablet Take 1 tablet (10 mg total) by mouth at bedtime. 05/04/18   [name], MD  hydrALAZINE (APRESOLINE) 25 MG tablet Take 1 tablet (25 mg total) by mouth every 8 (eight) hours. 04/21/19   Nani Skillern, PA-C  metoprolol tartrate (LOPRESSOR) 50 MG tablet Take 1 tablet (50 mg total) by mouth 2 (two) times daily. 04/21/19   Nani Skillern, PA-C  multivitamin (RENA-VIT) TABS tablet Take 1 tablet by mouth at bedtime. 04/21/19   Nani Skillern, PA-C  ondansetron (ZOFRAN) 4 MG tablet Take 1 tablet (4 mg total) by mouth every 8 (eight) hours as needed for nausea or vomiting. 04/22/19   Nani Skillern, PA-C  oxyCODONE (OXY IR/ROXICODONE) 5 MG immediate release tablet Take 1 tablet (5 mg total) by mouth every 4 (four) hours as needed for severe pain. 04/21/19   Nani Skillern, PA-C  pregabalin (LYRICA) 75 MG capsule TAKE 1 CAPSULE (75 MG TOTAL) BY MOUTH 2 (TWO) TIMES DAILY. 09/15/18   Forrest Moron, MD  traMADol (ULTRAM) 50 MG tablet Take 1 tablet (50 mg total) by mouth  every 12 (twelve) hours as needed for moderate pain. 05/13/19   Lajean Saver, MD    Physical Exam:   Vitals:   05/18/19 1130 05/18/19 1400 05/18/19 1500 05/18/19 1730  BP: 118/88 102/70 Marland Kitchen)  122/98 124/69  Pulse: 90 (!) 115  62  Resp: 20 20 20  (!) 23  Temp:      TempSrc:  Oral    SpO2: 95% 99%  98%     Physical Exam: Blood pressure 124/69, pulse 62, temperature 97.8 F (36.6 C), temperature source Oral, resp. rate (!) 23, SpO2 98 %. Gen: No acute distress.  Cachectic Head: Normocephalic, atraumatic. Eyes: Pupils equal, round and reactive to light. Extraocular movements intact.  Sclerae nonicteric. No lid lag. Mouth: Moist mucous membranes Neck: Supple, no thyromegaly, no lymphadenopathy, no jugular venous distention. Chest: Decreased air entry on the left hemithorax.  No wheezing or rales heard. CV: Irregular rate and rhythm, tachycardic. No murmurs, rubs, or gallops heard.  Abdomen: Soft, nontender, nondistended with normal active bowel sounds. No hepatosplenomegaly or palpable masses. Extremities: Extremities are without clubbing, or cyanosis. No edema. Pedal pulses 2+.  Skin: Warm and dry. No rashes, lesions or wounds.  Surgical wound on anterior midline chest has healed nicely. Neuro: Alert and oriented times 3; grossly nonfocal.  Psych: Insight is good and judgment is appropriate. Mood and affect normal.   Data Review:    Labs: Basic Metabolic Panel: Recent Labs  Lab 05/13/19 0958 05/18/19 1152  NA 137 135  K 4.1 4.6  CL 97* 95*  CO2 28 21*  GLUCOSE 114* 93  BUN 15 5*  CREATININE 3.37* 2.20*  CALCIUM 8.9 8.6*   Liver Function Tests: Recent Labs  Lab 05/13/19 0958 05/18/19 1152  AST 12* 14*  ALT 7 8  ALKPHOS 72 73  BILITOT 1.1 1.0  PROT 7.8 7.8  ALBUMIN 2.9* 2.6*   No results for input(s): LIPASE, AMYLASE in the last 168 hours. No results for input(s): AMMONIA in the last 168 hours. CBC: Recent Labs  Lab 05/13/19 0958 05/18/19 1152  WBC 9.4  11.2*  HGB 10.2* 10.8*  HCT 32.9* 35.1*  MCV 92.9 91.6  PLT 427* 683*   Cardiac Enzymes: No results for input(s): CKTOTAL, CKMB, CKMBINDEX, TROPONINI in the last 168 hours.  BNP (last 3 results) No results for input(s): PROBNP in the last 8760 hours. CBG: No results for input(s): GLUCAP in the last 168 hours.  Urinalysis    Component Value Date/Time   COLORURINE AMBER (A) 04/16/2019 0921   APPEARANCEUR CLOUDY (A) 04/16/2019 0921   LABSPEC 1.020 04/16/2019 0921   PHURINE 6.0 04/16/2019 0921   GLUCOSEU NEGATIVE 04/16/2019 0921   GLUCOSEU NEG mg/dL 10/30/2009 1944   HGBUR NEGATIVE 04/16/2019 0921   BILIRUBINUR NEGATIVE 04/16/2019 0921   BILIRUBINUR negative 05/04/2018 0910   KETONESUR NEGATIVE 04/16/2019 0921   PROTEINUR 100 (A) 04/16/2019 0921   UROBILINOGEN 0.2 05/04/2018 0910   UROBILINOGEN 1 02/14/2014 0946   NITRITE NEGATIVE 04/16/2019 0921   LEUKOCYTESUR TRACE (A) 04/16/2019 0921      Radiographic Studies: CT Angio Chest PE W and/or Wo Contrast  Result Date: 05/18/2019 CLINICAL DATA:  Shortness of breath. EXAM: CT ANGIOGRAPHY CHEST WITH CONTRAST TECHNIQUE: Multidetector CT imaging of the chest was performed using the standard protocol during bolus administration of intravenous contrast. Multiplanar CT image reconstructions and MIPs were obtained to evaluate the vascular anatomy. CONTRAST:  133mL OMNIPAQUE IOHEXOL 350 MG/ML SOLN COMPARISON:  CT chest dated May 13, 2019. FINDINGS: Cardiovascular: Satisfactory opacification of the pulmonary arteries to the segmental level. No evidence of pulmonary embolism. Unchanged mild cardiomegaly and small pericardial effusion. Prior ascending thoracic aortic graft repair. No dissection or aneurysm. Thoracic aortic and branch vessel atherosclerotic  vascular disease. Tunneled right internal jugular dialysis catheter with tip at the cavoatrial junction. Mediastinum/Nodes: No enlarged mediastinal, hilar, or axillary lymph nodes. Thyroid  gland, trachea, and esophagus demonstrate no significant findings. Lungs/Pleura: Enlarging now moderate left pleural effusion with near complete collapse of the left lower lobe. New subsegmental atelectasis in the lingula. No focal consolidation, pleural effusion, or pneumothorax. Unchanged scarring at the right lung base. No suspicious pulmonary nodule. Upper Abdomen: No acute abnormality. Musculoskeletal: No chest wall abnormality. No acute or significant osseous findings. Review of the MIP images confirms the above findings. IMPRESSION: 1.  No evidence of pulmonary embolism. 2. Enlarging now moderate left pleural effusion with near complete collapse of the left lower lobe. 3. Unchanged small pericardial effusion. 4.  Aortic atherosclerosis (ICD10-I70.0). Electronically Signed   By: Titus Dubin M.D.   On: 05/18/2019 16:46   DG Chest Port 1 View  Result Date: 05/18/2019 CLINICAL DATA:  Chest pain EXAM: PORTABLE CHEST 1 VIEW COMPARISON:  Five days ago FINDINGS: Mild increase in left pleural effusion obscuring the left lower lobe which was atelectatic on the recent chest CT. New mild right base atelectasis. Postoperative heart. The right lung is clear. Dialysis catheter on the right with tip at the upper cavoatrial junction. IMPRESSION: Progression of left pleural effusion and atelectasis seen on recent chest CT. Electronically Signed   By: Monte Fantasia M.D.   On: 05/18/2019 11:49    EKG: Independently reviewed.  Normal sinus rhythm, T wave inversions in leads I, aVL and V6   Assessment/Plan:   Active Problems:   Essential hypertension   Pleural effusion on left   Atrial fibrillation with RVR (HCC)    There is no height or weight on file to calculate BMI.   Left pleural effusion: Admit to telemetry.  Plan for ultrasound-guided thoracentesis tomorrow.  Recent heart surgery ( ascending thoracic aorta with supra coronary graft implantation): Cardiothoracic surgery team has been notified  by ED physician.  Thoracentesis was recommended.  Continue aspirin  Paroxysmal atrial fibrillation with RVR: Patient will be started on IV Cardizem infusion for rate control.  Patient has amiodarone on his admission patient reconciliation list but she was only advised to take it for 2 weeks per discharge instructions.  Continue metoprolol. Continue aspirin.  Of note, she had atrial fibrillation on her recent admission.  End-stage renal disease on hemodialysis: Patient had hemodialysis today.  Nephrologist will be consulted tomorrow.  Hypertension: Continue antihypertensives  Anemia of chronic disease and thrombocytosis: Thrombocytosis may be due to anemia.  Monitor CBC.  Mildly elevated troponin (40): This is not significantly different from troponin of 35 on 04/03/2019.  Repeat troponins.   Other information:   DVT prophylaxis: Heparin Code Status: Full code Family Communication: Plan discussed with the patient Disposition Plan: Possible discharge to home in 2 to 3 days depending on clinical improvement. Consults called: Cardiothoracic surgeon  admission status: Inpatient    The medical decision making is of high complexity, therefore this is a level 3 visit.  She is at high risk for decompensation.  Time spent 60 minutes  [place] [facility]   How to contact the [facility] Attending or Consulting provider [place] or covering provider during after hours [place], for this patient?   1. Check the care team in [facility] and look for a) attending/consulting [facility] provider listed and b) the [facility] team listed 2. Log into [email or web address] and use [place]'s universal password to access. If you do not have  the password, please contact the hospital operator. 3. Locate the [facility] provider you are looking for under [facility] and page to a number that you can be directly reached. 4. If you still have difficulty reaching the provider, please page the [facility] (Director on Call) for the  Hospitalists listed on amion for assistance.  05/18/2019, 6:42 PM

## 2019-05-18 NOTE — ED Notes (Signed)
Pt to CT

## 2019-05-18 NOTE — ED Notes (Signed)
Contacted provider regarding blood pressure. Holding cardizem and ordered a 1 liter bolus. Will continue to monitor

## 2019-05-18 NOTE — Progress Notes (Signed)
Pt is on a cardizem drip for heart rate. This is not an acute change.

## 2019-05-18 NOTE — ED Notes (Signed)
ED TO INPATIENT HANDOFF REPORT  ED Nurse Name and Phone #: Conception Doebler [phone]  S Name/Age/Gender Monique Henderson 68 y.o. female Room/Bed: 043C/043C  Code Status   Code Status: Prior  Home/SNF/Other Home Patient oriented to: self, place, time and situation Is this baseline? Yes   Triage Complete: Triage complete  Chief Complaint Pleural effusion on left [J90]  Triage Note Pt presents from dialysis (completed, gets HD T-Th-Sat) for CP that has been present since AAA repair in Nov 2020. Evaluated 2 days ago for this pain, states that pain has not changed since 2 days ago when evaluated, describes as sharp and burning midsternal CP that radiated to shoulder blades. Pt reports 10/10 pain.  EMS exam: 97.90F, losing wt wince surgery, 114-130 irregular rhythm on EKG (pt denies h/o afib). ASA allergy in chart but denied by pt    Allergies Allergies  Allergen Reactions  . Shrimp [Shellfish Allergy] Shortness Of Breath  . Bactroban [Mupirocin] Other (See Comments)    "Sores in nose"  . Vicodin [Hydrocodone-Acetaminophen] Itching and Nausea And Vomiting    This is patient's home medication  . Lisinopril Cough  . Tylenol [Acetaminophen] Itching    Level of Care/Admitting Diagnosis ED Disposition    ED Disposition Condition [facility] Area: [place] [[id number]]  Level of Care: Progressive [102]  Admit to Progressive based on following criteria: RESPIRATORY PROBLEMS hypoxemic/hypercapnic respiratory failure that is responsive to NIPPV (BiPAP) or High Flow Nasal Cannula (6-80 lpm). Frequent assessment/intervention, no > Q2 hrs < Q4 hrs, to maintain oxygenation and pulmonary hygiene.  Covid Evaluation: Confirmed COVID Negative  Diagnosis: Pleural effusion on left [[id number]]  Admitting Physician: Rayburn Go  Attending Physician: Rayburn Go  Estimated length of stay: past midnight tomorrow  Certification:: I certify this patient  will need inpatient services for at least 2 midnights       B Medical/Surgery History Past Medical History:  Diagnosis Date  . Atelectasis 2002   Bilateral  . Bone spur 2008   Right calcaneal foot spur  . Breast cancer ([place]) 2004   Ductal carcinoma in situ of the left breast; S/P left partial mastectomy 02/26/2003; S/P re-excision of cranial and lateral margins11/18/2004.radiation  . Breast cancer ([place]) 09/21/2012   right breast/ last radiation treatment 03/22/2013  . Chronic kidney disease, stage IV (severe) ([place]) 10/10/2007  . 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 Autumn Messing; S/P re-excision of superior and inferior margins 10/27/2012.   Marland Kitchen GERD (gastroesophageal reflux disease)   . 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 Hot flashes   . Hx of radiation therapy 2005   left breast  . Hx of radiation therapy 01/11/13- 03/22/13   right breast 5760 cGy 30 sessions  . Hypertension   . Low back pain   . Lumbar spinal stenosis    S/P lumbar decompressive laminectomy, fusion and plating for lumbar spinal stensosis  . Normocytic anemia    With thrombocytosis  . Osteoarthritis   . Personal history of radiation therapy   . Right ureteral stone 2002  . Shortness of breath    from pain  . Uterine fibroid   . Wears dentures    top   Past Surgical History:  Procedure Laterality Date  . ANTERIOR LAT LUMBAR FUSION N/A 01/18/2014   Procedure: ANTERIOR LATERAL LUMBAR FUSION LUMBAR TWO-THREE;  Surgeon: Camelia Eng  Ronnald Ramp, MD;  Location: [facility];  Service: Neurosurgery;  Laterality: N/A;  . ANTERIOR LUMBAR FUSION  01/18/2014  . AV FISTULA PLACEMENT Left 04/20/2019   Procedure: ARTERIOVENOUS (AV) FISTULA CREATION;  Surgeon: [name], MD;  Location: [facility];  Service: Vascular;  Laterality: Left;  . BACK SURGERY    . BREAST LUMPECTOMY Left 01/2003  . BREAST LUMPECTOMY Right 2014  . BREAST  LUMPECTOMY WITH NEEDLE LOCALIZATION AND AXILLARY SENTINEL LYMPH NODE BX Right 10/13/2012   Procedure: BREAST LUMPECTOMY WITH NEEDLE LOCALIZATION;  Surgeon: [name], MD;  Location: [facility];  Service: General;  Laterality: Right;  Right breast wire localized lumpectomy  . INSERTION OF DIALYSIS CATHETER Right 04/20/2019   Procedure: INSERTION OF DIALYSIS CATHETER, right internal jugular;  Surgeon: [name], MD;  Location: [facility];  Service: Vascular;  Laterality: Right;  . LAMINECTOMY  05/27/2009   Lumbar decompressive laminectomy, fusion and plating for lumbar spinal stensosis  . LUMBAR LAMINECTOMY/DECOMPRESSION MICRODISCECTOMY Left 03/23/2013   Procedure: LUMBAR LAMINECTOMY/DECOMPRESSION MICRODISCECTOMY 1 LEVEL;  Surgeon: Eustace Moore, MD;  Location: [facility];  Service: Neurosurgery;  Laterality: Left;  LUMBAR LAMINECTOMY/DECOMPRESSION MICRODISCECTOMY 1 LEVEL  . MASTECTOMY, PARTIAL Left 02/26/2003   ; S/P re-excision of cranial and lateral margins 04/19/2003.   Marland Kitchen RE-EXCISION OF BREAST CANCER,SUPERIOR MARGINS Right 10/27/2012   Procedure: RE-EXCISION OF BREAST CANCER,SUPERIOR and inferior MARGINS;  Surgeon: [name], MD;  Location: [facility];  Service: General;  Laterality: Right;  . RE-EXCISION OF BREAST LUMPECTOMY Left 04/2003  . TEE WITHOUT CARDIOVERSION N/A 04/04/2019   Procedure: Transesophageal Echocardiogram (Tee);  Surgeon: Wonda Olds, MD;  Location: [facility];  Service: Open Heart Surgery;  Laterality: N/A;  . THORACIC AORTIC ANEURYSM REPAIR N/A 04/04/2019   Procedure: THORACIC ASCENDING ANEURYSM REPAIR (AAA)  USING 28 MM X 30 CM HEMASHIELD PLATINUM VASCULAR GRAFT;  Surgeon: Wonda Olds, MD;  Location[facility];  Service: Open Heart Surgery;  Laterality: N/A;     A IV Location/Drains/Wounds Patient Lines/Drains/Airways Status   Active Line/Drains/Airways    Name:   Placement date:   Placement time:   Site:   Days:   Peripheral IV  04/20/19 Right Hand   04/20/19    1355    Hand   28   Peripheral IV 05/18/19 Right Antecubital   05/18/19    1154    Antecubital   less than 1   Peripheral IV 05/18/19 Right;Upper Forearm   05/18/19    1537    Forearm   less than 1   Hemodialysis Catheter Right Internal jugular Double lumen Permanent (Tunneled)   04/20/19    1509    Internal jugular   28   Incision (Closed) 04/04/19 Chest Other (Comment)   04/04/19    1326     44   Incision (Closed) 04/17/19 Chest Lateral;Right;Upper   04/17/19    2000     31   Incision (Closed) 04/20/19 Chest Right   04/20/19    1548     28   Incision (Closed) 04/20/19 Arm Left   04/20/19    1548     28          Intake/Output Last 24 hours No intake or output data in the 24 hours ending 05/18/19 1818  Labs/Imaging Results for orders placed or performed during the hospital encounter of 05/18/19 (from the past 48 hour(s))  CBC     Status: Abnormal  Collection Time: 05/18/19 11:52 AM  Result Value Ref Range   WBC 11.2 (H) 4.0 - 10.5 K/uL   RBC 3.83 (L) 3.87 - 5.11 MIL/uL   Hemoglobin 10.8 (L) 12.0 - 15.0 g/dL   HCT 35.1 (L) 36.0 - 46.0 %   MCV 91.6 80.0 - 100.0 fL   MCH 28.2 26.0 - 34.0 pg   MCHC 30.8 30.0 - 36.0 g/dL   RDW 16.7 (H) 11.5 - 15.5 %   Platelets 683 (H) 150 - 400 K/uL   nRBC 0.0 0.0 - 0.2 %    Comment: Performed at [facility] [address], [place], [place] [address]  Comprehensive metabolic panel     Status: Abnormal   Collection Time: 05/18/19 11:52 AM  Result Value Ref Range   Sodium 135 135 - 145 mmol/L   Potassium 4.6 3.5 - 5.1 mmol/L   Chloride 95 (L) 98 - 111 mmol/L   CO2 21 (L) 22 - 32 mmol/L   Glucose, Bld 93 70 - 99 mg/dL   BUN 5 (L) 8 - 23 mg/dL   Creatinine, Ser 2.20 (H) 0.44 - 1.00 mg/dL   Calcium 8.6 (L) 8.9 - 10.3 mg/dL   Total Protein 7.8 6.5 - 8.1 g/dL   Albumin 2.6 (L) 3.5 - 5.0 g/dL   AST 14 (L) 15 - 41 U/L   ALT 8 0 - 44 U/L   Alkaline Phosphatase 73 38 - 126 U/L   Total Bilirubin 1.0 0.3 - 1.2  mg/dL   GFR calc non Af Amer 23 (L) >60 mL/min   GFR calc Af Amer 26 (L) >60 mL/min   Anion gap 19 (H) 5 - 15    Comment: Performed at [facility], [place] [address], [place], [place] [address]  SARS CORONAVIRUS 2 (TAT 6-24 HRS) Nasopharyngeal Nasopharyngeal Swab     Status: None   Collection Time: 05/18/19 11:56 AM   Specimen: Nasopharyngeal Swab  Result Value Ref Range   SARS Coronavirus 2 NEGATIVE NEGATIVE    Comment: (NOTE) SARS-CoV-2 target nucleic acids are NOT DETECTED. The SARS-CoV-2 RNA is generally detectable in upper and lower respiratory specimens during the acute phase of infection. Negative results do not preclude SARS-CoV-2 infection, do not rule out co-infections with other pathogens, and should not be used as the sole basis for treatment or other patient management decisions. Negative results must be combined with clinical observations, patient history, and epidemiological information. The expected result is Negative. Fact Sheet for Patients: SugarRoll.be Fact Sheet for Healthcare Providers: https://www.woods-mathews.com/ This test is not yet approved or cleared by the [place] FDA and  has been authorized for detection and/or diagnosis of SARS-CoV-2 by FDA under an Emergency Use Authorization (EUA). This EUA will remain  in effect (meaning this test can be used) for the duration of the COVID-19 declaration under Section 56 4(b)(1) of the Act, 21 U.S.C. section 360bbb-3(b)(1), unless the authorization is terminated or revoked sooner. Performed at [facility], [place] [address] [place], [place] [address]    CT Angio Chest PE W and/or Wo Contrast  Result Date: 05/18/2019 CLINICAL DATA:  Shortness of breath. EXAM: CT ANGIOGRAPHY CHEST WITH CONTRAST TECHNIQUE: Multidetector CT imaging of the chest was performed using the standard protocol during bolus administration of intravenous contrast. Multiplanar CT image  reconstructions and MIPs were obtained to evaluate the vascular anatomy. CONTRAST:  159mL OMNIPAQUE IOHEXOL 350 MG/ML SOLN COMPARISON:  CT chest dated May 13, 2019. FINDINGS: Cardiovascular: Satisfactory  opacification of the pulmonary arteries to the segmental level. No evidence of pulmonary embolism. Unchanged mild cardiomegaly and small pericardial effusion. Prior ascending thoracic aortic graft repair. No dissection or aneurysm. Thoracic aortic and branch vessel atherosclerotic vascular disease. Tunneled right internal jugular dialysis catheter with tip at the cavoatrial junction. Mediastinum/Nodes: No enlarged mediastinal, hilar, or axillary lymph nodes. Thyroid gland, trachea, and esophagus demonstrate no significant findings. Lungs/Pleura: Enlarging now moderate left pleural effusion with near complete collapse of the left lower lobe. New subsegmental atelectasis in the lingula. No focal consolidation, pleural effusion, or pneumothorax. Unchanged scarring at the right lung base. No suspicious pulmonary nodule. Upper Abdomen: No acute abnormality. Musculoskeletal: No chest wall abnormality. No acute or significant osseous findings. Review of the MIP images confirms the above findings. IMPRESSION: 1.  No evidence of pulmonary embolism. 2. Enlarging now moderate left pleural effusion with near complete collapse of the left lower lobe. 3. Unchanged small pericardial effusion. 4.  Aortic atherosclerosis (ICD10-I70.0). Electronically Signed   By: Titus Dubin M.D.   On: 05/18/2019 16:46   DG Chest Port 1 View  Result Date: 05/18/2019 CLINICAL DATA:  Chest pain EXAM: PORTABLE CHEST 1 VIEW COMPARISON:  Five days ago FINDINGS: Mild increase in left pleural effusion obscuring the left lower lobe which was atelectatic on the recent chest CT. New mild right base atelectasis. Postoperative heart. The right lung is clear. Dialysis catheter on the right with tip at the upper cavoatrial junction. IMPRESSION:  Progression of left pleural effusion and atelectasis seen on recent chest CT. Electronically Signed   By: Monte Fantasia M.D.   On: 05/18/2019 11:49    Pending Labs Unresulted Labs (From admission, onward)   None      Vitals/Pain Today's Vitals   05/18/19 1400 05/18/19 1500 05/18/19 1530 05/18/19 1730  BP: 102/70 (!) 122/98  124/69  Pulse: (!) 115   62  Resp: 20 20  (!) 23  Temp:      TempSrc: Oral     SpO2: 99%   98%  PainSc:   Asleep     Isolation Precautions No active isolations  Medications Medications  morphine 2 MG/ML injection 2 mg (2 mg Intravenous Given 05/18/19 1200)  morphine 4 MG/ML injection 4 mg (4 mg Intravenous Given 05/18/19 1447)  ondansetron (ZOFRAN) injection 4 mg (4 mg Intravenous Given 05/18/19 1219)  LORazepam (ATIVAN) injection 1 mg (1 mg Intravenous Given 05/18/19 1445)  iohexol (OMNIPAQUE) 350 MG/ML injection 100 mL (100 mLs Intravenous Contrast Given 05/18/19 1556)    Mobility walks with person assist     Focused Assessments Cardiac Assessment Handoff:  Cardiac Rhythm: Atrial fibrillation Lab Results  Component Value Date   CKTOTAL 121 12/18/2009   CKMB 1.4 05/29/2009   TROPONINI 0.02        NO INDICATION OF MYOCARDIAL INJURY. 05/29/2009   No results found for: DDIMER Does the Patient currently have chest pain? No     R Recommendations: See Admitting Provider Note  Report given to:   Additional Notes:

## 2019-05-19 ENCOUNTER — Other Ambulatory Visit: Payer: Self-pay

## 2019-05-19 ENCOUNTER — Inpatient Hospital Stay (HOSPITAL_COMMUNITY): Payer: Medicare Other

## 2019-05-19 DIAGNOSIS — E440 Moderate protein-calorie malnutrition: Secondary | ICD-10-CM | POA: Insufficient documentation

## 2019-05-19 DIAGNOSIS — G92 Toxic encephalopathy: Secondary | ICD-10-CM

## 2019-05-19 HISTORY — PX: IR THORACENTESIS ASP PLEURAL SPACE W/IMG GUIDE: IMG5380

## 2019-05-19 HISTORY — DX: Moderate protein-calorie malnutrition: E44.0

## 2019-05-19 LAB — CBC
HCT: 30.9 % — ABNORMAL LOW (ref 36.0–46.0)
Hemoglobin: 9.5 g/dL — ABNORMAL LOW (ref 12.0–15.0)
MCH: 28.2 pg (ref 26.0–34.0)
MCHC: 30.7 g/dL (ref 30.0–36.0)
MCV: 91.7 fL (ref 80.0–100.0)
Platelets: 629 10*3/uL — ABNORMAL HIGH (ref 150–400)
RBC: 3.37 MIL/uL — ABNORMAL LOW (ref 3.87–5.11)
RDW: 17.1 % — ABNORMAL HIGH (ref 11.5–15.5)
WBC: 10.4 10*3/uL (ref 4.0–10.5)
nRBC: 0 % (ref 0.0–0.2)

## 2019-05-19 LAB — BASIC METABOLIC PANEL
Anion gap: 16 — ABNORMAL HIGH (ref 5–15)
BUN: 15 mg/dL (ref 8–23)
CO2: 20 mmol/L — ABNORMAL LOW (ref 22–32)
Calcium: 8.8 mg/dL — ABNORMAL LOW (ref 8.9–10.3)
Chloride: 97 mmol/L — ABNORMAL LOW (ref 98–111)
Creatinine, Ser: 3.95 mg/dL — ABNORMAL HIGH (ref 0.44–1.00)
GFR calc Af Amer: 13 mL/min — ABNORMAL LOW (ref >60)
GFR calc non Af Amer: 11 mL/min — ABNORMAL LOW (ref >60)
Glucose, Bld: 92 mg/dL (ref 70–99)
Potassium: 4.9 mmol/L (ref 3.5–5.1)
Sodium: 133 mmol/L — ABNORMAL LOW (ref 135–145)

## 2019-05-19 LAB — BODY FLUID CELL COUNT WITH DIFFERENTIAL
Lymphs, Fluid: 42 %
Monocyte-Macrophage-Serous Fluid: 46 % — ABNORMAL LOW (ref 50–90)
Neutrophil Count, Fluid: 12 % (ref 0–25)
Total Nucleated Cell Count, Fluid: 1200 cu mm — ABNORMAL HIGH (ref 0–1000)

## 2019-05-19 LAB — BLOOD GAS, ARTERIAL
Acid-Base Excess: 1.4 mmol/L (ref 0.0–2.0)
Bicarbonate: 26.4 mmol/L (ref 20.0–28.0)
Drawn by: 313941
FIO2: 21
O2 Saturation: 89.1 %
Patient temperature: 37
pCO2 arterial: 48.7 mmHg — ABNORMAL HIGH (ref 32.0–48.0)
pH, Arterial: 7.353 (ref 7.350–7.450)
pO2, Arterial: 56.7 mmHg — ABNORMAL LOW (ref 83.0–108.0)

## 2019-05-19 LAB — GLUCOSE, CAPILLARY: Glucose-Capillary: 123 mg/dL — ABNORMAL HIGH (ref 70–99)

## 2019-05-19 LAB — PROTEIN, PLEURAL OR PERITONEAL FLUID: Total protein, fluid: 4.3 g/dL

## 2019-05-19 LAB — LACTATE DEHYDROGENASE, PLEURAL OR PERITONEAL FLUID: LD, Fluid: 64 U/L — ABNORMAL HIGH (ref 3–23)

## 2019-05-19 LAB — LACTATE DEHYDROGENASE: LDH: 142 U/L (ref 98–192)

## 2019-05-19 LAB — TROPONIN I (HIGH SENSITIVITY): Troponin I (High Sensitivity): 35 ng/L — ABNORMAL HIGH (ref <18)

## 2019-05-19 MED ORDER — PRO-STAT SUGAR FREE PO LIQD
30.0000 mL | Freq: Two times a day (BID) | ORAL | Status: DC
  Administered 2019-05-20 (×2): 30 mL via ORAL
  Filled 2019-05-19 (×7): qty 30

## 2019-05-19 MED ORDER — RENA-VITE PO TABS
1.0000 | ORAL_TABLET | Freq: Every day | ORAL | Status: DC
  Administered 2019-05-19 – 2019-05-23 (×5): 1 via ORAL
  Filled 2019-05-19 (×5): qty 1

## 2019-05-19 MED ORDER — SODIUM CHLORIDE 0.9 % IV SOLN
20.0000 mg/kg | Freq: Once | INTRAVENOUS | Status: AC
  Administered 2019-05-20: 958 mg via INTRAVENOUS
  Filled 2019-05-19: qty 19.16

## 2019-05-19 MED ORDER — CHLORHEXIDINE GLUCONATE CLOTH 2 % EX PADS
6.0000 | MEDICATED_PAD | Freq: Every day | CUTANEOUS | Status: DC
  Administered 2019-05-19: 6 via TOPICAL

## 2019-05-19 MED ORDER — LIDOCAINE HCL 1 % IJ SOLN
INTRAMUSCULAR | Status: AC
  Filled 2019-05-19: qty 20

## 2019-05-19 MED ORDER — LORAZEPAM 2 MG/ML IJ SOLN
0.5000 mg | Freq: Once | INTRAMUSCULAR | Status: AC
  Administered 2019-05-20: 0.5 mg via INTRAVENOUS
  Filled 2019-05-19: qty 1

## 2019-05-19 MED ORDER — BOOST / RESOURCE BREEZE PO LIQD CUSTOM: 1.0000 | Freq: Two times a day (BID) | ORAL | Status: DC

## 2019-05-19 MED ORDER — LIDOCAINE HCL (PF) 1 % IJ SOLN
INTRAMUSCULAR | Status: DC | PRN
  Administered 2019-05-19: 10 mL

## 2019-05-19 MED ORDER — MORPHINE SULFATE (PF) 2 MG/ML IV SOLN
1.0000 mg | Freq: Once | INTRAVENOUS | Status: AC
  Administered 2019-05-19: 1 mg via INTRAVENOUS
  Filled 2019-05-19: qty 1

## 2019-05-19 NOTE — Progress Notes (Signed)
Pt refused SCDs and heparin. Educated x3 and pt still refuses. Will continue to monitor.

## 2019-05-19 NOTE — Progress Notes (Signed)
Pt requested pain medication for sudden chest pain. Obtained EKG and notified provider for interpretation to determine how to proceed. Will continue to monitor.

## 2019-05-19 NOTE — Progress Notes (Signed)
PROGRESS NOTE    Monique Henderson  GNF:[id number] DOB: Sep 28, 1950 DOA: 05/18/2019 PCP: [name], MD     Brief Narrative:  Monique Henderson is a 68 y.o. 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  She was referred from the outpatient hemodialysis center to the emergency room because of complaints of shortness of breath.  She said she has been feeling short of breath and having chest pain since she had her heart surgery on 04/04/2019.  Chest pain is usually across her chest and this is nothing new since her surgery.  However, shortness of breath has progressively worsened.  Shortness of breath is worsened by mild exertion while talking.  CT chest in the emergency department was negative for PE, but did find an increased left pleural effusion.  Cardiothoracic surgery team recommended patient to undergo thoracentesis.   New events last 24 hours / Subjective: Underwent thoracentesis without any issue this morning.  Denies any shortness of breath or chest pain today.  Wants to go home.  Assessment & Plan:   Active Problems:   Essential hypertension   Pleural effusion on left   Atrial fibrillation with RVR (HCC)   Moderate left pleural effusion -Status post thoracentesis 12/18 with 320 mL serosanguineous fluid removed.  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 Orvan Seen 04/04/2019 -Aspirin  Paroxysmal atrial fibrillation with RVR -Patient did receive IV Cardizem drip at the time of admission -She had recently finished amiodarone therapy as an outpatient and now is no  longer taking amiodarone -Back to normal sinus rhythm this morning.  Continue metoprolol  ESRD on dialysis -Nephrology consulted  Hypertension -Hydralazine, metoprolol  Elevated troponin -Demand ischemia, flat trend without concern for ACS    DVT prophylaxis: Subcutaneous heparin Code Status: Full code Family Communication: No family at bedside Disposition Plan: Pending clinical improvement, hopeful home discharge 1 to 2 days   Antimicrobials:  Anti-infectives (From admission, onward)   None        Objective: Vitals:   05/19/19 0700 05/19/19 0900 05/19/19 0914 05/19/19 1204  BP: (!) 105/59 118/61  112/63  Pulse:    79  Resp: 18 12  17   Temp:   97.8 F (36.6 C)   TempSrc:   Oral   SpO2: 95% 99%  94%  Weight:      Height:        Intake/Output Summary (Last 24 hours) at 05/19/2019 1305 Last data filed at 05/19/2019 1200 Gross per 24 hour  Intake 424.09 ml  Output 320 ml  Net 104.09 ml   Filed Weights   05/18/19 2011 05/19/19 0500 05/19/19 0530  Weight: 47.6 kg 47.9 kg 47.9 kg    Examination:  General exam: Appears calm and comfortable  Respiratory system: Diminished breath sounds left lower base.  On room air without respiratory distress or increase in effort.  No conversational dyspnea Cardiovascular system: S1 & S2 heard, RRR. No murmurs. No pedal edema. Gastrointestinal system: Abdomen is nondistended, soft and nontender. Normal bowel sounds heard. Central nervous system: Alert and oriented. No focal neurological deficits. Speech clear.  Extremities: Symmetric in appearance  Skin: No rashes, lesions or ulcers on exposed skin  Psychiatry: Judgement and insight appear  normal. Mood & affect appropriate.   Data Reviewed: I have personally reviewed following labs and imaging studies  CBC: Recent Labs  Lab 05/13/19 0958 05/18/19 1152 05/19/19 0423  WBC 9.4 11.2* 10.4  HGB 10.2* 10.8* 9.5*  HCT 32.9* 35.1* 30.9*  MCV 92.9 91.6 91.7  PLT 427* 683*  295*   Basic Metabolic Panel: Recent Labs  Lab 05/13/19 0958 05/18/19 1152 05/19/19 0423  NA 137 135 133*  K 4.1 4.6 4.9  CL 97* 95* 97*  CO2 28 21* 20*  GLUCOSE 114* 93 92  BUN 15 5* 15  CREATININE 3.37* 2.20* 3.95*  CALCIUM 8.9 8.6* 8.8*   GFR: Estimated Creatinine Clearance: 10.6 mL/min (A) (by C-G formula based on SCr of 3.95 mg/dL (H)). Liver Function Tests: Recent Labs  Lab 05/13/19 0958 05/18/19 1152  AST 12* 14*  ALT 7 8  ALKPHOS 72 73  BILITOT 1.1 1.0  PROT 7.8 7.8  ALBUMIN 2.9* 2.6*   No results for input(s): LIPASE, AMYLASE in the last 168 hours. No results for input(s): AMMONIA in the last 168 hours. Coagulation Profile: No results for input(s): INR, PROTIME in the last 168 hours. Cardiac Enzymes: No results for input(s): CKTOTAL, CKMB, CKMBINDEX, TROPONINI in the last 168 hours. BNP (last 3 results) No results for input(s): PROBNP in the last 8760 hours. HbA1C: No results for input(s): HGBA1C in the last 72 hours. CBG: No results for input(s): GLUCAP in the last 168 hours. Lipid Profile: No results for input(s): CHOL, HDL, LDLCALC, TRIG, CHOLHDL, LDLDIRECT in the last 72 hours. Thyroid Function Tests: No results for input(s): TSH, T4TOTAL, FREET4, T3FREE, THYROIDAB in the last 72 hours. Anemia Panel: No results for input(s): VITAMINB12, FOLATE, FERRITIN, TIBC, IRON, RETICCTPCT in the last 72 hours. Sepsis Labs: No results for input(s): PROCALCITON, LATICACIDVEN in the last 168 hours.  Recent Results (from the past 240 hour(s))  SARS CORONAVIRUS 2 (TAT 6-24 HRS) Nasopharyngeal Nasopharyngeal Swab     Status: None   Collection Time: 05/18/19 11:56 AM   Specimen: Nasopharyngeal Swab  Result Value Ref Range Status   SARS Coronavirus 2 NEGATIVE NEGATIVE Final    Comment: (NOTE) SARS-CoV-2 target nucleic acids are NOT DETECTED. The SARS-CoV-2 RNA is generally detectable in upper and lower respiratory specimens during the acute phase of infection.  Negative results do not preclude SARS-CoV-2 infection, do not rule out co-infections with other pathogens, and should not be used as the sole basis for treatment or other patient management decisions. Negative results must be combined with clinical observations, patient history, and epidemiological information. The expected result is Negative. Fact Sheet for Patients: SugarRoll.be Fact Sheet for Healthcare Providers: https://www.woods-mathews.com/ This test is not yet approved or cleared by the [place] FDA and  has been authorized for detection and/or diagnosis of SARS-CoV-2 by FDA under an Emergency Use Authorization (EUA). This EUA will remain  in effect (meaning this test can be used) for the duration of the COVID-19 declaration under Section 56 4(b)(1) of the Act, 21 U.S.C. section 360bbb-3(b)(1), unless the authorization is terminated or revoked sooner. Performed at [facility], [place] [address] [place], [place] [address]       Radiology Studies: DG Chest 1 View  Result Date: 05/19/2019 CLINICAL DATA:  Post left-sided thoracentesis. EXAM: CHEST  1 VIEW COMPARISON:  05/18/2019 FINDINGS: Sternotomy wires and right IJ dialysis catheter unchanged. Lungs are adequately inflated with moderate interval improvement in left-sided pleural effusion with small residual pleural effusions/atelectasis present. No left-sided  pneumothorax. Right lung is clear. Cardiomediastinal silhouette and remainder of the exam is unchanged. IMPRESSION: Significant improvement in left pleural effusion post thoracentesis with small residual pleural effusion/basilar atelectasis. No pneumothorax. Electronically Signed   By: Marin Olp M.D.   On: 05/19/2019 09:00   CT Angio Chest PE W and/or Wo Contrast  Result Date: 05/18/2019 CLINICAL DATA:  Shortness of breath. EXAM: CT ANGIOGRAPHY CHEST WITH CONTRAST TECHNIQUE: Multidetector CT imaging of the chest was  performed using the standard protocol during bolus administration of intravenous contrast. Multiplanar CT image reconstructions and MIPs were obtained to evaluate the vascular anatomy. CONTRAST:  154mL OMNIPAQUE IOHEXOL 350 MG/ML SOLN COMPARISON:  CT chest dated May 13, 2019. FINDINGS: Cardiovascular: Satisfactory opacification of the pulmonary arteries to the segmental level. No evidence of pulmonary embolism. Unchanged mild cardiomegaly and small pericardial effusion. Prior ascending thoracic aortic graft repair. No dissection or aneurysm. Thoracic aortic and branch vessel atherosclerotic vascular disease. Tunneled right internal jugular dialysis catheter with tip at the cavoatrial junction. Mediastinum/Nodes: No enlarged mediastinal, hilar, or axillary lymph nodes. Thyroid gland, trachea, and esophagus demonstrate no significant findings. Lungs/Pleura: Enlarging now moderate left pleural effusion with near complete collapse of the left lower lobe. New subsegmental atelectasis in the lingula. No focal consolidation, pleural effusion, or pneumothorax. Unchanged scarring at the right lung base. No suspicious pulmonary nodule. Upper Abdomen: No acute abnormality. Musculoskeletal: No chest wall abnormality. No acute or significant osseous findings. Review of the MIP images confirms the above findings. IMPRESSION: 1.  No evidence of pulmonary embolism. 2. Enlarging now moderate left pleural effusion with near complete collapse of the left lower lobe. 3. Unchanged small pericardial effusion. 4.  Aortic atherosclerosis (ICD10-I70.0). Electronically Signed   By: Titus Dubin M.D.   On: 05/18/2019 16:46   DG Chest Port 1 View  Result Date: 05/18/2019 CLINICAL DATA:  Chest pain EXAM: PORTABLE CHEST 1 VIEW COMPARISON:  Five days ago FINDINGS: Mild increase in left pleural effusion obscuring the left lower lobe which was atelectatic on the recent chest CT. New mild right base atelectasis. Postoperative heart. The  right lung is clear. Dialysis catheter on the right with tip at the upper cavoatrial junction. IMPRESSION: Progression of left pleural effusion and atelectasis seen on recent chest CT. Electronically Signed   By: Monte Fantasia M.D.   On: 05/18/2019 11:49      Scheduled Meds: . aspirin EC  325 mg Oral Daily  . Chlorhexidine Gluconate Cloth  6 each Topical Daily  . famotidine  10 mg Oral QHS  . heparin  5,000 Units Subcutaneous Q8H  . hydrALAZINE  25 mg Oral Q8H  . lidocaine      . metoprolol tartrate  50 mg Oral BID  . pregabalin  75 mg Oral BID   Continuous Infusions:   LOS: 1 day      Time spent: 40 minutes   Dessa Phi, DO [facility] 05/19/2019, 1:05 PM   Available via Epic secure chat 7am-7pm After these hours, please refer to coverage provider listed on [email or web address]

## 2019-05-19 NOTE — Progress Notes (Signed)
Patient has returned from [place]. She is currently eating in no acute distress, monitoring her closely for any signs of loss of consciousness.

## 2019-05-19 NOTE — Progress Notes (Signed)
Chaplain responded to spiritual care consult for prayer.  Chaplain and Monique Henderson engaged in initial visit and prayed together.  Monique Henderson expressed gratefulness for the prayer.   Chaplain will follow-up as needed.

## 2019-05-19 NOTE — Progress Notes (Signed)
Initial Nutrition Assessment  DOCUMENTATION CODES:   Non-severe (moderate) malnutrition in context of chronic illness  INTERVENTION:   -Boost Breeze po BID, each supplement provides 250 kcal and 9 grams of protein -30 ml Prostat BID, each supplement provides 100 kcals and 15 grams protein -Renal MVI daily -Magic cup BID with meals, each supplement provides 290 kcal and 9 grams of protein  NUTRITION DIAGNOSIS:   Moderate Malnutrition related to chronic illness(ESRD on HD) as evidenced by percent weight loss, mild fat depletion, moderate fat depletion, mild muscle depletion, moderate muscle depletion.  GOAL:   Patient will meet greater than or equal to 90% of their needs  MONITOR:   PO intake, Supplement acceptance, Labs, Weight trends, Skin, I & O's  REASON FOR ASSESSMENT:   Malnutrition Screening Tool    ASSESSMENT:   Monique Henderson is an 68 y.o. 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  She was referred from the outpatient hemodialysis center to the emergency room because of complaints of shortness of breath.  She said she has been feeling short of breath and having chest pain since she had her heart surgery on 04/04/2019.  Chest pain is usually across her chest and this is nothing new since her surgery.  However, shortness of breath has progressively worsened.  Shortness of breath is worsened by mild exertion while talking.  Today, she went for hemodialysis and when she complained of shortness of breath they referred her to the emergency room.  No cough, wheezing, fever, chills  Pt admitted with lt pleural effusion.   12/18- s/p US guided lt thoracentesis (320 ml serosanguineous fluid removed)  Reviewed I/O's: +286 ml x 24 hours  Spoke  with pt at bedside, who was lethargic at time of visit, and answered mainly close ended questions. Pt shares that her appetite has been "horrible" and has "eaten almost nothing" over the past several weeks due to lack of appetite. Pt felt encouraged that she consumed 100% of her breakfast this morning, which is unlike her. Per pt, she is feeling better after thoracentesis and pain medication- she believes pain control and improved respiratory status are positively impacting her appetite.   Pt endorses weight due to minimal oral intake and recent prolonged hospitalization. She is unsure of UBW. Noted pt has experienced a 16.8% wt loss over the past 3 months, which is significant for time frame. Of note, pt initiated HD last mont and suspect some weight loss may be related to fluid losses. Pt is unsure of dry wt, but reports HD sessions are going well.   Discussed importance of good meal and supplement intake to promote healing. Pt recalls trying supplements during previous hospitalization and did not tolerate Nepro well ("it ran right throuhh me").   Labs reviewed: Na: 133.   NUTRITION - FOCUSED PHYSICAL EXAM:    Most Recent Value  Orbital Region  Mild depletion  Upper Arm Region  Mild depletion  Thoracic and Lumbar Region  Mild depletion  Buccal Region  Mild depletion  Temple Region  Moderate depletion  Clavicle Bone Region  Mild depletion  Clavicle and Acromion Bone Region  Mild depletion  Scapular Bone Region  Mild depletion  Dorsal Hand  Moderate depletion  Patellar Region  Mild depletion  Anterior Thigh Region  Mild depletion  Posterior Calf Region  Mild depletion  Edema (RD Assessment)  None  Hair  Reviewed  Eyes  Reviewed  Mouth  Reviewed  Skin  Reviewed  Nails  Reviewed       Diet Order:   Diet Order            Diet renal with fluid restriction Fluid restriction: 1200 mL Fluid; Room service appropriate? Yes; Fluid consistency: Thin  Diet effective now               EDUCATION NEEDS:   Education needs have been addressed  Skin:  Skin Assessment: Reviewed RN Assessment  Last BM:  05/15/19  Height:   Ht Readings from Last 1 Encounters:  05/18/19 5\' 3"  (1.6 m)    Weight:   Wt Readings from Last 1 Encounters:  05/19/19 47.9 kg    Ideal Body Weight:  52.3 kg  BMI:  Body mass index is 18.72 kg/m.  Estimated Nutritional Needs:   Kcal:  1500-1700  Protein:  80-95 grams  Fluid:  1.2 L    Zailyn Thoennes A. Jimmye Norman, RD, LDN, [place] Registered Dietitian II Certified Diabetes Care and Education Specialist Pager: [phone] After hours Pager: [phone]

## 2019-05-19 NOTE — Progress Notes (Signed)
Patient demonstrated lethargy and concentration deficit on attempt to administer 2200 meds. Rapid and MD notified. Rapid present.

## 2019-05-19 NOTE — Progress Notes (Signed)
Hydralazine not given by order of provider due to pt blood pressure and continues cardizem drip. BP 117/63   Pulse 75   Temp 98.2 F (36.8 C) (Oral)   Resp 18   Ht 5\' 3"  (1.6 m)   Wt 47.6 kg   SpO2 98%   BMI 18.60 kg/m  Will continue to monitor.

## 2019-05-19 NOTE — Procedures (Signed)
PROCEDURE SUMMARY:  Successful US guided left thoracentesis. Yielded 320 mL of serosanguinous fluid. Pt tolerated procedure well. No immediate complications.  Specimen was sent for labs. CXR ordered.  EBL < 5 mL  Ascencion Dike PA-C 05/19/2019 8:52 AM

## 2019-05-19 NOTE — Significant Event (Signed)
Rapid Response Event Note  Overview: Asked to see pt d/t lethargy. Pt here with SOB/CP, L pleural effusion. Pt had thoracentesis done today. Last HD was 12/17 PTA. Time Called: 2146 Arrival Time: 2156 Event Type: Neurologic  Initial Focused Assessment: Pt sitting up in bed, alert and oriented, irritable, moves all extremities, and follows commands. Pt has no focal deficits. Pt does have some twitching of her face and BUE. After pt assessed, pt became unresponsive with blank stare on face. During this period, pt was unresponsive to painful stimuli and wouldn't blink to threat. The episode lasted <30 sec. Pt had no VS changes during this episode and then returned to baseline. T-99.9, HR-91, BP-123/68, RR-21, SpO2-93% on RA.   While in room and transporting pt to CT, pt had multiple unresponsive episodes with pt returning to prior neuro status.  Interventions: CT head STAT-negative for acute changes ABG-7.35/48.7/56.7/26.4> Pt placed on 4L [place]>will recheck ABG in AM PCXR-Cardiomegaly. Small L effusion with L base atelectasis.  Pt upgraded to PC status.  Neuro consulted. Plan of Care (if not transferred): Continue to monitor pt closely. Await orders from neuro MD. Call RRT if further assistance needed. Event Summary: Name of Physician Notified: Silas [place], NP at 2156  Name of Consulting Physician Notified: Dr. Kristine Garbe) at 2211  Outcome: Stayed in room and stabalized  Event End Time: 2245  Dillard [place]

## 2019-05-19 NOTE — Progress Notes (Signed)
Pt started on 4L O2 [place] as ordered. Will continue to monitor.

## 2019-05-19 NOTE — Progress Notes (Signed)
CCMD called to report 7 beat run of vtach at 0333. Checked pt strip and confirmed run. Assessed pt to find her in no acute distress. MD notified. Will continue to monitor.

## 2019-05-19 NOTE — Consult Note (Signed)
Neurology Consultation  Reason for Consult: Episodic decreased level of consciousness, altered mental status Referring provider: Arby Barrette, NP-[facility], Dessa Phi, DO-[facility]  CC: Episodes of decreased level of consciousness and altered mental status  History is obtained from: Chart review, patient's RN  HPI: Monique Henderson is a 68 y.o. female with a past medical history of end-stage renal disease on dialysis TTS, breast cancer, atrial fibrillation, hepatitis C that has been treated, hypertension, low back pain, admitted to the hospital for evaluation of worsening shortness of breath from her dialysis yesterday.  She was noted to have a left-sided pleural effusion, which was treated today with a thoracentesis by interventional radiology, that yielded about 320 mL of serosanguineous fluid.  She was noted to night, the time of nighttime medication administration by the RN to be more lethargic and not responding to questions with sudden changes in her level of consciousness-at times he was respond to questions and then suddenly stared off or fall asleep and then come around.  I was called over the phone after rapid response team evaluated the patient, regarding recommendations on imaging.  I recommended a stat head CT be obtained given her multiple cerebrovascular risk factors to rule out an emergent bleed or acute ischemic stroke. She was seen and evaluated in the CAT scanner, along with the rapid response team and the primary team provider onsite. She was unable to provide a coherent history but was able to follow all commands.  Speech is very dysarthric.  See detailed exam below.  Since admission, she was treated with Cardizem drip for A. fib with RVR, thoracentesis for the left-sided moderate pleural effusion, dialysis per nephrology. Chart review also reveals prior history of cocaine abuse with multiple positive toxicology is up until 2014.  A toxicology test-tox  assure 13 in 2019-negative for drugs.  Review of MAR reveals she received oxycodone about 40 minutes prior to this change being noticed and rapid response being called by the RN.   LKW: Unclear tpa given?: no, nonfocal exam Premorbid modified Rankin scale (mRS): Unable to clearly ascertain  ROS:  Unable to obtain due to altered mental status.   Past Medical History:  Diagnosis Date  . Atelectasis 2002   Bilateral  . Bone spur 2008   Right calcaneal foot spur  . Breast cancer ([place]) 2004   Ductal carcinoma in situ of the left breast; S/P left partial mastectomy 02/26/2003; S/P re-excision of cranial and lateral margins11/18/2004.radiation  . Breast cancer ([place]) 09/21/2012   right breast/ last radiation treatment 03/22/2013  . Chronic kidney disease, stage IV (severe) ([place]) 10/10/2007  . 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 Autumn Messing; S/P re-excision of superior and inferior margins 10/27/2012.   Marland Kitchen GERD (gastroesophageal reflux disease)   . 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 Hot flashes   . Hx of radiation therapy 2005   left breast  . Hx of radiation therapy 01/11/13- 03/22/13   right breast 5760 cGy 30 sessions  . Hypertension   . Low back pain   . Lumbar spinal stenosis    S/P lumbar decompressive laminectomy, fusion and plating for lumbar spinal stensosis  . Normocytic anemia    With thrombocytosis  . Osteoarthritis   . Personal history of radiation therapy   . Right ureteral stone 2002  . Shortness of breath    from pain  . Uterine fibroid   .  Wears dentures    top    Family History  Problem Relation Age of Onset  . Colon cancer Mother 90  . Hypertension Mother   . Diabetes Sister 56  . Hypertension Sister   . Diabetes Brother   . Hypertension Brother   . Diabetes Brother   . Hypertension Brother   . Kidney disease Son        On dialysis  . Hypertension Son   .  Diabetes Son   . Multiple sclerosis Son   . Bone cancer Sister 62  . Breast cancer Neg Hx   . Cervical cancer Neg Hx     Social History:   reports that she has been smoking cigarettes. She has a 11.00 pack-year smoking history. She has never used smokeless tobacco. She reports current alcohol use of about 2.0 standard drinks of alcohol per week. She reports current drug use. Drug: Cocaine.  Medications  Current Facility-Administered Medications:  .  albuterol (PROVENTIL) (2.5 MG/3ML) 0.083% nebulizer solution 2.5 mg, 2.5 mg, Inhalation, Q6H PRN, Jennye Boroughs, MD .  aspirin EC tablet 325 mg, 325 mg, Oral, Daily, Jennye Boroughs, MD, 325 mg at 05/19/19 0917 .  Chlorhexidine Gluconate Cloth 2 % PADS 6 each, 6 each, Topical, Daily, Jennye Boroughs, MD, 6 each at 05/19/19 1034 .  famotidine (PEPCID) tablet 10 mg, 10 mg, Oral, QHS, Jennye Boroughs, MD, 10 mg at 05/18/19 2202 .  [START ON 05/20/2019] feeding supplement (BOOST / RESOURCE BREEZE) liquid 1 Container, 1 Container, Oral, BID BM, Dessa Phi, DO .  [START ON 05/20/2019] feeding supplement (PRO-STAT SUGAR FREE 64) liquid 30 mL, 30 mL, Oral, BID BM, Dessa Phi, DO .  heparin injection 5,000 Units, 5,000 Units, Subcutaneous, Q8H, Jennye Boroughs, MD, 5,000 Units at 05/19/19 1518 .  hydrALAZINE (APRESOLINE) tablet 25 mg, 25 mg, Oral, Q8H, Jennye Boroughs, MD, 25 mg at 05/19/19 1518 .  lidocaine (PF) (XYLOCAINE) 1 % injection, , Infiltration, PRN, Bruning, Kevin, PA-C, 10 mL at 05/19/19 0831 .  metoprolol tartrate (LOPRESSOR) tablet 50 mg, 50 mg, Oral, BID, Jennye Boroughs, MD, 50 mg at 05/19/19 4332 .  multivitamin (RENA-VIT) tablet 1 tablet, 1 tablet, Oral, QHS, Choi, Jennifer, DO .  ondansetron (ZOFRAN) tablet 4 mg, 4 mg, Oral, Q6H PRN **OR** ondansetron (ZOFRAN) injection 4 mg, 4 mg, Intravenous, Q6H PRN, Jennye Boroughs, MD .  oxyCODONE (Oxy IR/ROXICODONE) immediate release tablet 5 mg, 5 mg, Oral, Q4H PRN, Bodenheimer, Charles A, NP,  5 mg at 05/19/19 0925 .  polyethylene glycol (MIRALAX / GLYCOLAX) packet 17 g, 17 g, Oral, Daily PRN, Jennye Boroughs, MD .  pregabalin (LYRICA) capsule 75 mg, 75 mg, Oral, BID, Jennye Boroughs, MD, 75 mg at 05/19/19 9518   Exam: Current vital signs: BP 105/66   Pulse 89   Temp 99.1 F (37.3 C) (Oral)   Resp 18   Ht 5\' 3"  (1.6 m)   Wt 47.9 kg Comment: scale A  SpO2 93%   BMI 18.72 kg/m  Vital signs in last 24 hours: Temp:  [97.8 F (36.6 C)-99.1 F (37.3 C)] 99.1 F (37.3 C) (12/18 2056) Pulse Rate:  [75-89] 89 (12/18 2132) Resp:  [12-23] 18 (12/18 2056) BP: (89-123)/(59-74) 105/66 (12/18 2132) SpO2:  [91 %-99 %] 93 % (12/18 2132) Weight:  [47.9 kg] 47.9 kg (12/18 0530) General: Awake alert followed by brief moment of staring and then return to being alert again. HEENT: Cephalic atraumatic Cardiovascular: Regular rate rhythm Lungs: Diminished breath sounds on the left, otherwise  unremarkable Cardiovascular: Regular rate rhythm Extremities: Warm well perfused with no edema Neurological exam Awake, alert, follows commands.  There are episodic lapses in her level of consciousness where she either stares off for a brief moment and then comes back around or falls asleep while talking. Speech is severely dysarthric Attention concentration is poor Cranial nerves: Pupils equal round reactive to light, no gaze preference or deviation, does not blink from either side consistently, face is symmetric although she is edentulous giving face minor asymmetry Motor exam: Both upper extremities antigravity with florid asterixis on outstretched arms.  Moves both lower extremities equally to noxious stimulation. Sensory exam: As above Coordination: Difficult assessing her mentation   Labs I have reviewed labs in epic and the results pertinent to this consultation are:  CBC    Component Value Date/Time   WBC 10.4 05/19/2019 0423   RBC 3.37 (L) 05/19/2019 0423   HGB 9.5 (L) 05/19/2019 0423    HGB 11.7 02/24/2019 1244   HGB 13.1 09/28/2012 0831   HCT 30.9 (L) 05/19/2019 0423   HCT 36.1 02/24/2019 1244   HCT 40.1 09/28/2012 0831   PLT 629 (H) 05/19/2019 0423   PLT 365 02/24/2019 1244   MCV 91.7 05/19/2019 0423   MCV 88 02/24/2019 1244   MCV 90.7 09/28/2012 0831   MCH 28.2 05/19/2019 0423   MCHC 30.7 05/19/2019 0423   RDW 17.1 (H) 05/19/2019 0423   RDW 13.9 02/24/2019 1244   RDW 14.2 09/28/2012 0831   LYMPHSABS 1.0 04/21/2019 1730   LYMPHSABS 1.7 05/04/2018 0922   LYMPHSABS 1.8 09/28/2012 0831   MONOABS 0.4 04/21/2019 1730   MONOABS 0.5 09/28/2012 0831   EOSABS 0.3 04/21/2019 1730   EOSABS 0.3 05/04/2018 0922   BASOSABS 0.1 04/21/2019 1730   BASOSABS 0.0 05/04/2018 0922   BASOSABS 0.0 09/28/2012 0831    CMP     Component Value Date/Time   NA 133 (L) 05/19/2019 0423   NA 143 02/24/2019 1244   NA 142 09/28/2012 0831   K 4.9 05/19/2019 0423   K 4.2 09/28/2012 0831   CL 97 (L) 05/19/2019 0423   CL 103 09/28/2012 0831   CO2 20 (L) 05/19/2019 0423   CO2 30 (H) 09/28/2012 0831   GLUCOSE 92 05/19/2019 0423   GLUCOSE 103 (H) 09/28/2012 0831   BUN 15 05/19/2019 0423   BUN 31 (H) 02/24/2019 1244   BUN 18.3 09/28/2012 0831   CREATININE 3.95 (H) 05/19/2019 0423   CREATININE 2.09 (H) 01/18/2017 1145   CREATININE 1.2 (H) 09/28/2012 0831   CALCIUM 8.8 (L) 05/19/2019 0423   CALCIUM 9.4 09/28/2012 0831   PROT 7.8 05/18/2019 1152   PROT 6.7 02/24/2019 1244   PROT 7.2 09/28/2012 0831   ALBUMIN 2.6 (L) 05/18/2019 1152   ALBUMIN 4.2 02/24/2019 1244   ALBUMIN 3.5 09/28/2012 0831   AST 14 (L) 05/18/2019 1152   AST 35 (H) 09/28/2012 0831   ALT 8 05/18/2019 1152   ALT 23 12/09/2016 1458   ALT 41 09/28/2012 0831   ALKPHOS 73 05/18/2019 1152   ALKPHOS 89 09/28/2012 0831   BILITOT 1.0 05/18/2019 1152   BILITOT 0.3 02/24/2019 1244   BILITOT 0.34 09/28/2012 0831   GFRNONAA 11 (L) 05/19/2019 0423   GFRNONAA 23 (L) 12/09/2016 1458   GFRAA 13 (L) 05/19/2019 0423   GFRAA  26 (L) 12/09/2016 1458    Imaging I have reviewed the images obtained: CT-scan of the brain-no acute changes.  Moderate to severe chronic small  vessel disease .   Assessment:  Episodic altered LOC, with asterixis on exam Likely waxing and waning mental status from toxic metabolic encephalopathy Can not rule out seizures altogether, so will give small dose benzo and load with fospheny. Will continue AEDs only if EEG shows any abnormality or if frank seizure activity is observed.  Recommendations: -Give half milligram Ativan IV now -Give fosphenytoin 20 mg/kg load in case this is some sort of seizure activity although suspicion is that this is more of asterixis and toxic metabolic encephalopathy rather than being seizures. -Check CBC, CMP, ammonia, chest x-ray. -Correct metabolic derangements -Some report of patient having low-grade fever.  Repeat vitals.  Afebrile, panculture. -If still producing urine, get urine drug screen. -Routine EEG in AM -MRI w/o contrast when able to D/W Primary team provider at bedside.  -- Amie Portland, MD [facility] Pager: [phone] If 7pm to 7am, please call on call as listed on AMION.   ADDENDUM -ABG with low PO2. -Other tests pending. Will follow.  -- Amie Portland, MD [facility] Pager: [phone] If 7pm to 7am, please call on call as listed on AMION.

## 2019-05-20 ENCOUNTER — Inpatient Hospital Stay (HOSPITAL_COMMUNITY): Payer: Medicare Other

## 2019-05-20 DIAGNOSIS — R0602 Shortness of breath: Secondary | ICD-10-CM

## 2019-05-20 LAB — HEPATIC FUNCTION PANEL
ALT: 8 U/L (ref 0–44)
AST: 11 U/L — ABNORMAL LOW (ref 15–41)
Albumin: 2.2 g/dL — ABNORMAL LOW (ref 3.5–5.0)
Alkaline Phosphatase: 66 U/L (ref 38–126)
Bilirubin, Direct: <0.1 mg/dL (ref 0.0–0.2)
Total Bilirubin: 0.3 mg/dL (ref 0.3–1.2)
Total Protein: 6.2 g/dL — ABNORMAL LOW (ref 6.5–8.1)

## 2019-05-20 LAB — BASIC METABOLIC PANEL
Anion gap: 12 (ref 5–15)
BUN: 21 mg/dL (ref 8–23)
CO2: 24 mmol/L (ref 22–32)
Calcium: 8.8 mg/dL — ABNORMAL LOW (ref 8.9–10.3)
Chloride: 96 mmol/L — ABNORMAL LOW (ref 98–111)
Creatinine, Ser: 6.53 mg/dL — ABNORMAL HIGH (ref 0.44–1.00)
GFR calc Af Amer: 7 mL/min — ABNORMAL LOW (ref >60)
GFR calc non Af Amer: 6 mL/min — ABNORMAL LOW (ref >60)
Glucose, Bld: 166 mg/dL — ABNORMAL HIGH (ref 70–99)
Potassium: 4.5 mmol/L (ref 3.5–5.1)
Sodium: 132 mmol/L — ABNORMAL LOW (ref 135–145)

## 2019-05-20 LAB — CBC
HCT: 28.9 % — ABNORMAL LOW (ref 36.0–46.0)
Hemoglobin: 8.6 g/dL — ABNORMAL LOW (ref 12.0–15.0)
MCH: 28 pg (ref 26.0–34.0)
MCHC: 29.8 g/dL — ABNORMAL LOW (ref 30.0–36.0)
MCV: 94.1 fL (ref 80.0–100.0)
Platelets: 587 10*3/uL — ABNORMAL HIGH (ref 150–400)
RBC: 3.07 MIL/uL — ABNORMAL LOW (ref 3.87–5.11)
RDW: 17.5 % — ABNORMAL HIGH (ref 11.5–15.5)
WBC: 9.8 10*3/uL (ref 4.0–10.5)
nRBC: 0 % (ref 0.0–0.2)

## 2019-05-20 LAB — MRSA PCR SCREENING: MRSA by PCR: NEGATIVE

## 2019-05-20 LAB — AMMONIA: Ammonia: 22 umol/L (ref 9–35)

## 2019-05-20 MED ORDER — DOXERCALCIFEROL 4 MCG/2ML IV SOLN
1.0000 ug | INTRAVENOUS | Status: DC
  Administered 2019-05-20 – 2019-05-23 (×2): 1 ug via INTRAVENOUS
  Filled 2019-05-20 (×2): qty 2

## 2019-05-20 MED ORDER — CHLORHEXIDINE GLUCONATE CLOTH 2 % EX PADS
6.0000 | MEDICATED_PAD | Freq: Every day | CUTANEOUS | Status: DC
  Administered 2019-05-20 – 2019-05-22 (×3): 6 via TOPICAL

## 2019-05-20 MED ORDER — TRAMADOL HCL 50 MG PO TABS
50.0000 mg | ORAL_TABLET | Freq: Four times a day (QID) | ORAL | Status: DC | PRN
  Filled 2019-05-20: qty 1

## 2019-05-20 MED ORDER — SODIUM CHLORIDE 0.9 % IV SOLN
INTRAVENOUS | Status: DC | PRN
  Administered 2019-05-20: 1000 mL via INTRAVENOUS

## 2019-05-20 MED ORDER — HEPARIN SODIUM (PORCINE) 1000 UNIT/ML IJ SOLN
INTRAMUSCULAR | Status: AC
  Administered 2019-05-20: 3000 [IU]
  Filled 2019-05-20: qty 3

## 2019-05-20 NOTE — Progress Notes (Signed)
Upon morning assessment, patient is very somnolent, alert/oriented to place, situation, and self, disoriented to time. Arms and hands are weak, patient unable to hold water cup due to shaking arms and hands. Current vitals are as follows:   05/20/19 0746  Vitals  BP (!) 97/58  MAP (mmHg) 69  BP Location Right Arm  BP Method Automatic  Patient Position (if appropriate) Lying  Pulse Rate 75  Pulse Rate Source Monitor  Resp 15  Oxygen Therapy  SpO2 100 %  O2 Device Nasal Cannula  O2 Flow Rate (L/min) 4 L/min  MEWS Score  MEWS RR 0  MEWS Pulse 0  MEWS Systolic 1  MEWS LOC 0  MEWS Temp 0  MEWS Score 1  MEWS Score Color Norvel Richards, DO paged, verbal orders to hold scheduled hydralazine.

## 2019-05-20 NOTE — Progress Notes (Addendum)
Nurse spoke with patient's sister on the phone, updated her on plan of care only with patient's permission. Patient's sister was updated on visitation policy. Patient currently siting in bed, eating breakfast, bed lowest position, call bell within reach.

## 2019-05-20 NOTE — Significant Event (Signed)
Paged by bedside RN regarding pt being lethargic and having periods of unresponsive episodes. Upon entering the room pt is A&O x 3, MOE x 4 and will follow simple commands. No focal deficits noted and VSS. Then pt became unresponsive with some twitching and jerking in the BUE. She does not blink to threat or respond to sternal rub during this time. The episodes lasted approximately 10-20 seconds and were occurring every 5-10 minutes. She had approximately 3-5 episodes  Spoke with Neurology who recommended a Head CT and she was evaluated by Neurology down in CT as well (See consult note for details). An ABG was obtained showing Ph: 7.53 PCO2: 48.7, PO2: 56.7, and O2 sat: 89.4. She was placed on 4L via [place] and we will repeat the ABG in the AM. Upon arriving back from CT pt did not have any further unresponsive episodes for approximately 64mins. VSS throughout this time. Upon exiting room pt resting in bed eating her dinner tray with assistance from the bedside RN.  Arby Barrette APRN-C [facility] Pager [phone]

## 2019-05-20 NOTE — Progress Notes (Signed)
Nurse spoke with patient's son Monique Henderson) regarding plan of care only, with patient's permission. [place] requested to be put on the call list in the patient's chart, patient gave nurse permission to do so.

## 2019-05-20 NOTE — Procedures (Signed)
ELECTROENCEPHALOGRAM REPORT   Patient: Monique Henderson       Room #: 0S92Z EEG No. ID: [id number] Age: 68 y.o.        Sex: female Referring Physician: Maylene Roes Report Date:  05/20/2019        Interpreting Physician: [name]  History: Monique Henderson is an 68 y.o. female with altered mental status  Medications:  ASA, Apresoline, Lopressor  Conditions of Recording:  This is a 21 channel routine scalp EEG performed with bipolar and monopolar montages arranged in accordance to the international 10/20 system of electrode placement. One channel was dedicated to EKG recording.  The patient is in the awake and drowsy states.  Description:  The waking background activity consists of a low voltage, symmetrical, fairly well organized, 7 Hz theta activity, seen from the parieto-occipital and posterior temporal regions.  Low voltage fast activity, poorly organized, is seen anteriorly and is at times superimposed on more posterior regions.  A mixture of theta and alpha rhythms are seen from the central and temporal regions. The patient drowses with slowing to irregular, low voltage theta and beta activity.   Stage II sleep is not obtained. No epileptiform activity is noted.   Hyperventilation and intermittent photic stimulation were not performed.   IMPRESSION: This is an abnormal EEG secondary to mild posterior background slowing.  This finding may be seen with a diffuse gray matter disturbance that is etiologically nonspecific, but may include a dementia, among other possibilities.  No epileptiform activity is noted.     [name], MD Neurology [phone] 05/20/2019, 12:04 PM

## 2019-05-20 NOTE — Progress Notes (Signed)
NEURO HOSPITALIST PROGRESS NOTE   Subjective: Initially drowsy in bed, but woke to voice and was able to participate. Shared that she normally lives alone and her sister helps her. She is able to ambulate without any assistive devices. She has two sons that live nearby and also help her. She states she is feeling better today and said she wants to get out of the hospital. Her nurse said that she has notice several times that she seems disoriented upon waking, however it is very brief and then she is able to answer questions appropriately. No starring or altered LOC episodes during conversation today, according to her nurse. No seizure activity observed. While in the room, her son called the bedside nurse for an update.   Exam: Vitals:   05/20/19 0746 05/20/19 0800  BP: (!) 97/58 115/68  Pulse: 75 76  Resp: 15 16  Temp:    SpO2: 100% 100%    Physical Exam  Constitutional: thin, frail  Psych: Affect appropriate to situation Eyes: Normal external eye and conjunctiva. HENT: Normocephalic, no lesions, without obvious abnormality.   Musculoskeletal-no joint tenderness, deformity or swelling Cardiovascular: Normal rate and regular rhythm.  Respiratory: Effort normal, non-labored breathing Skin: WDI  Neuro Exam  Mental Status: Drowsy initially upon start of exam. Oriented to place, situation, year, month. Speech fluent without evidence of aphasia.  Able to follow 3 step commands without difficulty. Cranial Nerves: II: Visual fields grossly normal,  III,IV, VI: ptosis not present, extra-ocular motions intact bilaterally V,VII: smile symmetric, facial light touch sensation normal bilaterally VIII: hearing normal bilaterally XII: midline tongue extension Motor: Right : Upper extremity   5/5    Left:     Upper extremity   5/5  Lower extremity   5/5     Lower extremity   5/5 Asterixis noted in hands when holding them up during strength testing testing L>R. Sensory:  light touch intact throughout, bilaterally Gait: not tested   Medications: I have reviewed the patient's current medications.  Pertinent Labs/Diagnostics:   DG Chest 1 View  Result Date: 05/19/2019 CLINICAL DATA:  Post left-sided thoracentesis. EXAM: CHEST  1 VIEW COMPARISON:  05/18/2019 FINDINGS: Sternotomy wires and right IJ dialysis catheter unchanged. Lungs are adequately inflated with moderate interval improvement in left-sided pleural effusion with small residual pleural effusions/atelectasis present. No left-sided pneumothorax. Right lung is clear. Cardiomediastinal silhouette and remainder of the exam is unchanged. IMPRESSION: Significant improvement in left pleural effusion post thoracentesis with small residual pleural effusion/basilar atelectasis. No pneumothorax. Electronically Signed   By: Marin Olp M.D.   On: 05/19/2019 09:00   CT HEAD WO CONTRAST  Result Date: 05/19/2019 CLINICAL DATA:  Altered mental status. EXAM: CT HEAD WITHOUT CONTRAST TECHNIQUE: Contiguous axial images were obtained from the base of the skull through the vertex without intravenous contrast. COMPARISON:  None. 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 No acute intracranial abnormality. Specifically, no hemorrhage, hydrocephalus, mass lesion, acute infarction, or significant intracranial injury. Vascular: No hyperdense vessel or unexpected calcification. Skull: No acute calvarial abnormality. Sinuses/Orbits: Visualized paranasal sinuses and mastoids clear. Orbital soft tissues unremarkable. 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 No acute intracranial abnormality. Electronically Signed   By: Rolm Baptise M.D.   On: 05/19/2019 22:41   CT Angio Chest PE W and/or Wo Contrast  Result Date: 05/18/2019  CLINICAL DATA:  Shortness of breath. EXAM: CT ANGIOGRAPHY CHEST WITH CONTRAST TECHNIQUE: Multidetector CT imaging of the chest  was performed using the standard protocol during bolus administration of intravenous contrast. Multiplanar CT image reconstructions and MIPs were obtained to evaluate the vascular anatomy. CONTRAST:  13mL OMNIPAQUE IOHEXOL 350 MG/ML SOLN COMPARISON:  CT chest dated May 13, 2019. FINDINGS: Cardiovascular: Satisfactory opacification of the pulmonary arteries to the segmental level. No evidence of pulmonary embolism. Unchanged mild cardiomegaly and small pericardial effusion. Prior ascending thoracic aortic graft repair. No dissection or aneurysm. Thoracic aortic and branch vessel atherosclerotic vascular disease. Tunneled right internal jugular dialysis catheter with tip at the cavoatrial junction. Mediastinum/Nodes: No enlarged mediastinal, hilar, or axillary lymph nodes. Thyroid gland, trachea, and esophagus demonstrate no significant findings. Lungs/Pleura: Enlarging now moderate left pleural effusion with near complete collapse of the left lower lobe. New subsegmental atelectasis in the lingula. No focal consolidation, pleural effusion, or pneumothorax. Unchanged scarring at the right lung base. No suspicious pulmonary nodule. Upper Abdomen: No acute abnormality. Musculoskeletal: No chest wall abnormality. No acute or significant osseous findings. Review of the MIP images confirms the above findings. IMPRESSION: 1.  No evidence of pulmonary embolism. 2. Enlarging now moderate left pleural effusion with near complete collapse of the left lower lobe. 3. Unchanged small pericardial effusion. 4.  Aortic atherosclerosis (ICD10-I70.0). Electronically Signed   By: Titus Dubin M.D.   On: 05/18/2019 16:46   DG Chest Port 1 View  Result Date: 05/19/2019 CLINICAL DATA:  Shortness of breath EXAM: PORTABLE CHEST 1 VIEW COMPARISON:  05/19/2019 FINDINGS: Prior CABG. Cardiomegaly. Small left pleural effusion with left base atelectasis. Right lung clear. No acute bony abnormality. IMPRESSION: Cardiomegaly.  Small  left effusion with left base atelectasis. Electronically Signed   By: Rolm Baptise M.D.   On: 05/19/2019 23:11   DG Chest Port 1 View  Result Date: 05/18/2019 CLINICAL DATA:  Chest pain EXAM: PORTABLE CHEST 1 VIEW COMPARISON:  Five days ago FINDINGS: Mild increase in left pleural effusion obscuring the left lower lobe which was atelectatic on the recent chest CT. New mild right base atelectasis. Postoperative heart. The right lung is clear. Dialysis catheter on the right with tip at the upper cavoatrial junction. IMPRESSION: Progression of left pleural effusion and atelectasis seen on recent chest CT. Electronically Signed   By: Monte Fantasia M.D.   On: 05/18/2019 11:49   IR THORACENTESIS ASP PLEURAL SPACE W/IMG GUIDE  Result Date: 05/19/2019 INDICATION: Shortness of breath. Left-sided pleural effusion. Request for diagnostic therapeutic thoracentesis. EXAM: ULTRASOUND GUIDED LEFT THORACENTESIS MEDICATIONS: None. COMPLICATIONS: None immediate. PROCEDURE: An ultrasound guided thoracentesis was thoroughly discussed with the patient and questions answered. The benefits, risks, alternatives and complications were also discussed. The patient understands and wishes to proceed with the procedure. Written consent was obtained. Ultrasound was performed to localize and mark an adequate pocket of fluid in the left chest. The area was then prepped and draped in the normal sterile fashion. 1% Lidocaine was used for local anesthesia. Under ultrasound guidance a 6 Fr Safe-T-Centesis catheter was introduced. Thoracentesis was performed. The catheter was removed and a dressing applied. FINDINGS: A total of approximately 320 mL of serosanguineous fluid was removed. Samples were sent to the laboratory as requested by the clinical team. IMPRESSION: Successful ultrasound guided left thoracentesis yielding 320 mL of pleural fluid. Read by: Ascencion Dike PA-C Electronically Signed   By: Aletta Edouard M.D.   On: 05/19/2019  09:13   Assessment:  68 y.o. female with past medical history significant for end-stage renal disease on dialysis TTS, breast cancer, atrial fibrillation, hepatitis C that has been treated, hypertension, low back and positive cocaine toxicology up to 2014.   Neurology was consulted yesterday for episodic altered LOC with asterixis. Described yesterday as episodic lapses in her level of consciousness where she either stares off for a brief moment and then comes back around or falls asleep while talking. Speech was severely dysarthric yesterday. Head CT showed no acute changes. Felt to be likely toxic metabolic encephalopathy. Loaded with fospheny with plans to discontinue if EEG today is unremarkable. MRI Brain pending.   During this admission she was also treated with a cardizem drip for a-fib with RVR and a thoracentesis for left-sided moderate pleural effusion.   Impression: Episodes of altered LOC yesterday, appears to be resolved today. Was possibly related to a metabolic encephalopathy   Recommendations:  Does not appear to be having any seizure activity or altered LOC today. EEG results and MRI Brain Pending. If both return normal, recommendation to stop fosphenytoin.    Attending Neurologist recommendations to follow   Gwenyth Bouillon DNP, FNP-C  [facility] Nurse Practitioner  05/20/2019, 9:28 AM

## 2019-05-20 NOTE — Progress Notes (Signed)
EEG complete - results pending 

## 2019-05-20 NOTE — Progress Notes (Signed)
Patient is A&Ox4 but has flat affect with forward gaze. Fields of gaze OK. Speech is dysarthric. Coordination is diminished with inability to hold a cup or use a straw. APP has been notified.

## 2019-05-20 NOTE — Progress Notes (Signed)
Patient returned to unit from hemodialysis, alert/oriented x4. States "I feel much better than this I did this morning". No shaking or tremors noted, patient able to hold water while taking medications without dropping cup. Current vitals are as follows:   05/20/19 1700  Vitals  Temp 99.2 F (37.3 C)  Temp Source Oral  BP (!) 144/77  MAP (mmHg) 96  BP Location Right Arm  BP Method Automatic  Patient Position (if appropriate) Lying  Pulse Rate 89  ECG Heart Rate 90  Resp 19  Level of Consciousness  Level of Consciousness Alert  Oxygen Therapy  SpO2 100 %  O2 Device Nasal Cannula  O2 Flow Rate (L/min) 4 L/min  MEWS Score  MEWS RR 0  MEWS Pulse 0  MEWS Systolic 0  MEWS LOC 0  MEWS Temp 0  MEWS Score 0  MEWS Score Color Green   Patient currently lying in bed with eyes closed, patient's sister at bedside, call bell within reach, and bed lowest position.

## 2019-05-20 NOTE — Consult Note (Addendum)
[facility] Renal Consultation Note    Indication for Consultation:  Management of ESRD/hemodialysis; anemia, hypertension/volume and secondary hyperparathyroidism  GMW:[id number], Lilia Argue, MD  HPI: Monique Henderson is a 68 y.o. female. 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  Patient has been dialysis dependent since 11/5.  Past medical history significant for uncontrolled HTN, type 1 aortic dissection s/p surgical repair, Hx A fib, Hx pleural effusion, Hx breast cancer s/p mastectomy, GERD, and Hep C s/p treatment.  Majority of history obtained through chart review due to patient current mental status.  Patient was sent to the ED after completing dialysis on Thursday due to shortness of breath and chest pain.  CT chest in the ED negative for PE but showed increased L pleural effusion.  CTS recommended thoracentesis and it was completed on 12/18 yielding 342mL serosanguinous fluid.  Rapid response called last night due to several episodes of becoming altered/unresponsive and staring off in space and then returning to normal.  Neuro recommended stat CT which had no acute findings.  Pertinent labs collected today SCR 6.53, BUN 21,  K 4.5, Ca 8.8.  Patient seen and examined at bedside.  Staring off into space when I walked in, not responding.  When I told her she needed to have dialysis she responded and said no.  Oriented to self and place, confused about day and time.  Had another staring spell and then responded to additional questions and agreed to dialysis.  Denies SOB, CP, n/v/d, abdominal pain, and headache.  Reports she came to the ED due to shortness of breath and then mumbled additional details that were incoherent.    Past Medical History:  Diagnosis Date  . Atelectasis 2002   Bilateral  . Bone spur 2008   Right calcaneal foot spur  . Breast cancer ([place]) 2004   Ductal carcinoma in situ of the left  breast; S/P left partial mastectomy 02/26/2003; S/P re-excision of cranial and lateral margins11/18/2004.radiation  . Breast cancer ([place]) 09/21/2012   right breast/ last radiation treatment 03/22/2013  . Chronic kidney disease, stage IV (severe) ([place]) 10/10/2007  . 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 Autumn Messing; S/P re-excision of superior and inferior margins 10/27/2012.   Marland Kitchen GERD (gastroesophageal reflux disease)   . 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 Hot flashes   . Hx of radiation therapy 2005   left breast  . Hx of radiation therapy 01/11/13- 03/22/13   right breast 5760 cGy 30 sessions  . Hypertension   . Low back pain   . Lumbar spinal stenosis    S/P lumbar decompressive laminectomy, fusion and plating for lumbar spinal stensosis  . Normocytic anemia    With thrombocytosis  . Osteoarthritis   . Personal history of radiation therapy   . Right ureteral stone 2002  . Shortness of breath    from pain  . Uterine fibroid   . Wears dentures    top   Past Surgical History:  Procedure Laterality Date  . ANTERIOR LAT LUMBAR FUSION N/A 01/18/2014   Procedure: ANTERIOR LATERAL LUMBAR FUSION LUMBAR TWO-THREE;  Surgeon: Eustace Moore, MD;  Location: [place] [facility];  Service: Neurosurgery;  Laterality: N/A;  . ANTERIOR LUMBAR FUSION  01/18/2014  . AV FISTULA PLACEMENT Left 04/20/2019   Procedure: ARTERIOVENOUS (AV) FISTULA CREATION;  Surgeon: [name], MD;  Location: [place];  Service: Vascular;  Laterality: Left;  . BACK SURGERY    . BREAST LUMPECTOMY Left 01/2003  . BREAST LUMPECTOMY Right 2014  . BREAST LUMPECTOMY WITH NEEDLE LOCALIZATION AND AXILLARY SENTINEL LYMPH NODE BX Right 10/13/2012   Procedure: BREAST LUMPECTOMY WITH NEEDLE LOCALIZATION;  Surgeon: [name], MD;  Location: [place];  Service: General;  Laterality: Right;  Right breast wire localized  lumpectomy  . INSERTION OF DIALYSIS CATHETER Right 04/20/2019   Procedure: INSERTION OF DIALYSIS CATHETER, right internal jugular;  Surgeon: [name], MD;  Location: [place];  Service: Vascular;  Laterality: Right;  . IR THORACENTESIS ASP PLEURAL SPACE W/IMG GUIDE  05/19/2019  . LAMINECTOMY  05/27/2009   Lumbar decompressive laminectomy, fusion and plating for lumbar spinal stensosis  . LUMBAR LAMINECTOMY/DECOMPRESSION MICRODISCECTOMY Left 03/23/2013   Procedure: LUMBAR LAMINECTOMY/DECOMPRESSION MICRODISCECTOMY 1 LEVEL;  Surgeon: Eustace Moore, MD;  Location: [place] [facility];  Service: Neurosurgery;  Laterality: Left;  LUMBAR LAMINECTOMY/DECOMPRESSION MICRODISCECTOMY 1 LEVEL  . MASTECTOMY, PARTIAL Left 02/26/2003   ; S/P re-excision of cranial and lateral margins 04/19/2003.   Marland Kitchen RE-EXCISION OF BREAST CANCER,SUPERIOR MARGINS Right 10/27/2012   Procedure: RE-EXCISION OF BREAST CANCER,SUPERIOR and inferior MARGINS;  Surgeon: [name], MD;  Location: [place];  Service: General;  Laterality: Right;  . RE-EXCISION OF BREAST LUMPECTOMY Left 04/2003  . TEE WITHOUT CARDIOVERSION N/A 04/04/2019   Procedure: Transesophageal Echocardiogram (Tee);  Surgeon: Wonda Olds, MD;  Location: [place];  Service: Open Heart Surgery;  Laterality: N/A;  . THORACIC AORTIC ANEURYSM REPAIR N/A 04/04/2019   Procedure: THORACIC ASCENDING ANEURYSM REPAIR (AAA)  USING 28 MM X 30 CM HEMASHIELD PLATINUM VASCULAR GRAFT;  Surgeon: Wonda Olds, MD;  Location: [facility];  Service: Open Heart Surgery;  Laterality: N/A;   Family History  Problem Relation Age of Onset  . Colon cancer Mother 25  . Hypertension Mother   . Diabetes Sister 55  . Hypertension Sister   . Diabetes Brother   . Hypertension Brother   . Diabetes Brother   . Hypertension Brother   . Kidney disease Son        On dialysis  . Hypertension Son   . Diabetes Son   . Multiple sclerosis Son   . Bone cancer Sister 40  . Breast cancer Neg Hx    . Cervical cancer Neg Hx    Social History:  reports that she has been smoking cigarettes. She has a 11.00 pack-year smoking history. She has never used smokeless tobacco. She reports current alcohol use of about 2.0 standard drinks of alcohol per week. She reports current drug use. Drug: Cocaine. Allergies  Allergen Reactions  . Shrimp [Shellfish Allergy] Shortness Of Breath  . Bactroban [Mupirocin] Other (See Comments)    "Sores in nose"  . Vicodin [Hydrocodone-Acetaminophen] Itching and Nausea And Vomiting    This is patient's home medication  . Lisinopril Cough  . Tylenol [Acetaminophen] Itching   Prior to Admission medications   Medication Sig Start Date End Date Taking? Authorizing Provider  albuterol (VENTOLIN HFA) 108 (90 Base) MCG/ACT inhaler Inhale 2 puffs into the lungs every 6 (six) hours as needed for wheezing or shortness of breath. 02/02/19  Yes [name], MD  amiodarone (PACERONE) 200 MG tablet Take 1 tablet (200 mg total) by mouth daily. For 2 weeks then stop. 04/21/19  Yes Lars Pinks M, PA-C  amLODipine (NORVASC)  10 MG tablet TAKE 1 TABLET (10 MG TOTAL) BY MOUTH DAILY. 09/20/18  Yes [name], MD  aspirin EC 81 MG tablet Take 81 mg by mouth daily.   Yes [provider]  cloNIDine (CATAPRES - DOSED IN MG/24 HR) 0.3 mg/24hr patch Place 1 patch (0.3 mg total) onto the skin once a week. 04/22/19  Yes Lars Pinks M, PA-C  famotidine (PEPCID) 10 MG tablet Take 1 tablet (10 mg total) by mouth at bedtime. 05/04/18  Yes [name], MD  hydrALAZINE (APRESOLINE) 25 MG tablet Take 1 tablet (25 mg total) by mouth every 8 (eight) hours. 04/21/19  Yes Lars Pinks M, PA-C  metoprolol tartrate (LOPRESSOR) 50 MG tablet Take 1 tablet (50 mg total) by mouth 2 (two) times daily. 04/21/19  Yes Lars Pinks M, PA-C  multivitamin (RENA-VIT) TABS tablet Take 1 tablet by mouth at bedtime. 04/21/19  Yes Tacy Dura, Donielle M, PA-C  ondansetron  (ZOFRAN) 4 MG tablet Take 1 tablet (4 mg total) by mouth every 8 (eight) hours as needed for nausea or vomiting. 04/22/19  Yes Lars Pinks M, PA-C  oxyCODONE (OXY IR/ROXICODONE) 5 MG immediate release tablet Take 1 tablet (5 mg total) by mouth every 4 (four) hours as needed for severe pain. 04/21/19  Yes Tacy Dura, Donielle M, PA-C  pregabalin (LYRICA) 75 MG capsule TAKE 1 CAPSULE (75 MG TOTAL) BY MOUTH 2 (TWO) TIMES DAILY. 09/15/18  Yes Forrest Moron, MD  aspirin EC 325 MG tablet Take 1 tablet (325 mg total) by mouth daily. Patient not taking: Reported on 05/19/2019 04/21/19 04/20/20  Nani Skillern, PA-C  traMADol (ULTRAM) 50 MG tablet Take 1 tablet (50 mg total) by mouth every 12 (twelve) hours as needed for moderate pain. Patient not taking: Reported on 05/19/2019 05/13/19   Lajean Saver, MD   Current Facility-Administered Medications  Medication Dose Route Frequency Provider Last Rate Last Admin  . 0.9 %  sodium chloride infusion   Intravenous PRN Dessa Phi, DO 10 mL/hr at 05/20/19 0029 1,000 mL at 05/20/19 0029  . albuterol (PROVENTIL) (2.5 MG/3ML) 0.083% nebulizer solution 2.5 mg  2.5 mg Inhalation Q6H PRN Jennye Boroughs, MD      . aspirin EC tablet 325 mg  325 mg Oral Daily Jennye Boroughs, MD   325 mg at 05/20/19 0946  . Chlorhexidine Gluconate Cloth 2 % PADS 6 each  6 each Topical Q0600 Penninger, [place], [place]      . famotidine (PEPCID) tablet 10 mg  10 mg Oral QHS Jennye Boroughs, MD   10 mg at 05/19/19 2306  . feeding supplement (BOOST / RESOURCE BREEZE) liquid 1 Container  1 Container Oral BID BM Dessa Phi, DO      . feeding supplement (PRO-STAT SUGAR FREE 64) liquid 30 mL  30 mL Oral BID BM Dessa Phi, DO   30 mL at 05/20/19 0946  . heparin injection 5,000 Units  5,000 Units Subcutaneous Q8H Jennye Boroughs, MD   5,000 Units at 05/19/19 1518  . hydrALAZINE (APRESOLINE) tablet 25 mg  25 mg Oral Q8H Jennye Boroughs, MD   25 mg at 05/19/19 2306  . metoprolol  tartrate (LOPRESSOR) tablet 50 mg  50 mg Oral BID Jennye Boroughs, MD   50 mg at 05/19/19 2306  . multivitamin (RENA-VIT) tablet 1 tablet  1 tablet Oral QHS Dessa Phi, DO   1 tablet at 05/19/19 2306  . ondansetron (ZOFRAN) tablet 4 mg  4 mg Oral Q6H PRN Jennye Boroughs, MD  Or  . ondansetron (ZOFRAN) injection 4 mg  4 mg Intravenous Q6H PRN Jennye Boroughs, MD      . polyethylene glycol (MIRALAX / GLYCOLAX) packet 17 g  17 g Oral Daily PRN Jennye Boroughs, MD       Labs: Basic Metabolic Panel: Recent Labs  Lab 05/18/19 1152 05/19/19 0423 05/20/19 0243  NA 135 133* 132*  K 4.6 4.9 4.5  CL 95* 97* 96*  CO2 21* 20* 24  GLUCOSE 93 92 166*  BUN 5* 15 21  CREATININE 2.20* 3.95* 6.53*  CALCIUM 8.6* 8.8* 8.8*   Liver Function Tests: Recent Labs  Lab 05/18/19 1152 05/20/19 0243  AST 14* 11*  ALT 8 8  ALKPHOS 73 66  BILITOT 1.0 0.3  PROT 7.8 6.2*  ALBUMIN 2.6* 2.2*    Recent Labs  Lab 05/20/19 0243  AMMONIA 22   CBC: Recent Labs  Lab 05/18/19 1152 05/19/19 0423 05/20/19 0243  WBC 11.2* 10.4 9.8  HGB 10.8* 9.5* 8.6*  HCT 35.1* 30.9* 28.9*  MCV 91.6 91.7 94.1  PLT 683* 629* 587*   CBG: Recent Labs  Lab 05/19/19 2139  GLUCAP 123*   Studies/Results: DG Chest 1 View  Result Date: 05/19/2019 CLINICAL DATA:  Post left-sided thoracentesis. EXAM: CHEST  1 VIEW COMPARISON:  05/18/2019 FINDINGS: Sternotomy wires and right IJ dialysis catheter unchanged. Lungs are adequately inflated with moderate interval improvement in left-sided pleural effusion with small residual pleural effusions/atelectasis present. No left-sided pneumothorax. Right lung is clear. Cardiomediastinal silhouette and remainder of the exam is unchanged. IMPRESSION: Significant improvement in left pleural effusion post thoracentesis with small residual pleural effusion/basilar atelectasis. No pneumothorax. Electronically Signed   By: Marin Olp M.D.   On: 05/19/2019 09:00   CT HEAD WO  CONTRAST  Result Date: 05/19/2019 CLINICAL DATA:  Altered mental status. EXAM: CT HEAD WITHOUT CONTRAST TECHNIQUE: Contiguous axial images were obtained from the base of the skull through the vertex without intravenous contrast. COMPARISON:  None. 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 No acute intracranial abnormality. Specifically, no hemorrhage, hydrocephalus, mass lesion, acute infarction, or significant intracranial injury. Vascular: No hyperdense vessel or unexpected calcification. Skull: No acute calvarial abnormality. Sinuses/Orbits: Visualized paranasal sinuses and mastoids clear. Orbital soft tissues unremarkable. 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 No acute intracranial abnormality. Electronically Signed   By: Rolm Baptise M.D.   On: 05/19/2019 22:41   CT Angio Chest PE W and/or Wo Contrast  Result Date: 05/18/2019 CLINICAL DATA:  Shortness of breath. EXAM: CT ANGIOGRAPHY CHEST WITH CONTRAST TECHNIQUE: Multidetector CT imaging of the chest was performed using the standard protocol during bolus administration of intravenous contrast. Multiplanar CT image reconstructions and MIPs were obtained to evaluate the vascular anatomy. CONTRAST:  169mL OMNIPAQUE IOHEXOL 350 MG/ML SOLN COMPARISON:  CT chest dated May 13, 2019. FINDINGS: Cardiovascular: Satisfactory opacification of the pulmonary arteries to the segmental level. No evidence of pulmonary embolism. Unchanged mild cardiomegaly and small pericardial effusion. Prior ascending thoracic aortic graft repair. No dissection or aneurysm. Thoracic aortic and branch vessel atherosclerotic vascular disease. Tunneled right internal jugular dialysis catheter with tip at the cavoatrial junction. Mediastinum/Nodes: No enlarged mediastinal, hilar, or axillary lymph nodes. Thyroid gland, trachea, and esophagus demonstrate no significant findings. Lungs/Pleura:  Enlarging now moderate left pleural effusion with near complete collapse of the left lower lobe. New subsegmental atelectasis in the lingula. No focal consolidation, pleural effusion, or pneumothorax. Unchanged scarring at the  right lung base. No suspicious pulmonary nodule. Upper Abdomen: No acute abnormality. Musculoskeletal: No chest wall abnormality. No acute or significant osseous findings. Review of the MIP images confirms the above findings. IMPRESSION: 1.  No evidence of pulmonary embolism. 2. Enlarging now moderate left pleural effusion with near complete collapse of the left lower lobe. 3. Unchanged small pericardial effusion. 4.  Aortic atherosclerosis (ICD10-I70.0). Electronically Signed   By: Titus Dubin M.D.   On: 05/18/2019 16:46   DG Chest Port 1 View  Result Date: 05/19/2019 CLINICAL DATA:  Shortness of breath EXAM: PORTABLE CHEST 1 VIEW COMPARISON:  05/19/2019 FINDINGS: Prior CABG. Cardiomegaly. Small left pleural effusion with left base atelectasis. Right lung clear. No acute bony abnormality. IMPRESSION: Cardiomegaly.  Small left effusion with left base atelectasis. Electronically Signed   By: Rolm Baptise M.D.   On: 05/19/2019 23:11   DG Chest Port 1 View  Result Date: 05/18/2019 CLINICAL DATA:  Chest pain EXAM: PORTABLE CHEST 1 VIEW COMPARISON:  Five days ago FINDINGS: Mild increase in left pleural effusion obscuring the left lower lobe which was atelectatic on the recent chest CT. New mild right base atelectasis. Postoperative heart. The right lung is clear. Dialysis catheter on the right with tip at the upper cavoatrial junction. IMPRESSION: Progression of left pleural effusion and atelectasis seen on recent chest CT. Electronically Signed   By: Monte Fantasia M.D.   On: 05/18/2019 11:49   IR THORACENTESIS ASP PLEURAL SPACE W/IMG GUIDE  Result Date: 05/19/2019 INDICATION: Shortness of breath. Left-sided pleural effusion. Request for diagnostic therapeutic thoracentesis.  EXAM: ULTRASOUND GUIDED LEFT THORACENTESIS MEDICATIONS: None. COMPLICATIONS: None immediate. PROCEDURE: An ultrasound guided thoracentesis was thoroughly discussed with the patient and questions answered. The benefits, risks, alternatives and complications were also discussed. The patient understands and wishes to proceed with the procedure. Written consent was obtained. Ultrasound was performed to localize and mark an adequate pocket of fluid in the left chest. The area was then prepped and draped in the normal sterile fashion. 1% Lidocaine was used for local anesthesia. Under ultrasound guidance a 6 Fr Safe-T-Centesis catheter was introduced. Thoracentesis was performed. The catheter was removed and a dressing applied. FINDINGS: A total of approximately 320 mL of serosanguineous fluid was removed. Samples were sent to the laboratory as requested by the clinical team. IMPRESSION: Successful ultrasound guided left thoracentesis yielding 320 mL of pleural fluid. Read by: Ascencion Dike PA-C Electronically Signed   By: Aletta Edouard M.D.   On: 05/19/2019 09:13    ROS: Unable to complete full ROS due to current mental status   Physical Exam: Vitals:   05/20/19 0500 05/20/19 0602 05/20/19 0746 05/20/19 0800  BP:  102/60 (!) 97/58 115/68  Pulse:   75 76  Resp:   15 16  Temp: 99.3 F (37.4 C)     TempSrc: Oral     SpO2:   100% 100%  Weight:      Height:         General: WDWN NAD, thin frail female Head: NCAT sclera not icteric Neck: Supple. No lymphadenopathy Lungs: CTA bilaterally. No wheeze, rales or rhonchi. Breathing is unlabored. Heart: RRR. No murmur, rubs or gallops.  Abdomen: soft, nontender, +BS, no guarding, no rebound tenderness  Lower extremities:no edema, ischemic changes, or open wounds  Neuro: Oriented to person and place.   Dialysis Access:LU AVF maturing, [facility]  Dialysis Orders:    TTS - AF  4hrs, BFR 400, DFR 800,  EDW 49kg, 3K/ 2.25Ca  Access: LU AVF maturing, [facility]   Heparin none Mircera 200 mcg q2wks - last 12/15 Venofer 100mg  qHD x10 (8 completed) Hectorol 2mcg IV qHD     Assessment/Plan: 1.  SOB 2/2 pleural effusion - s/p thoracentesis. Per primary 2. Altered mental status - CT negative.  Possibly metabolic, will see if improvement post dialysis.  Neuro consulting.  MRI, EEG and labs ordered. 3.  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  Orders written for HD today per regular schedule. K 4.5.  4.  Hypertension/volume  - BP soft, on metoprolol 50mg  BID and hydralazine 25mg  TID. Does not appear volume overloaded. Minimal gains at OP HD. Goal today set for 0.5kg.   5.  Anemia of CKD - Hgb 8.6.  Recently dosed with ESA. Continue to follow.  6.  Secondary Hyperparathyroidism -  Ca in goal. Will check phos.  Continue VDRA. Not on binders.  7.  Nutrition - Renal diet w/fluid restrictions 8. Type 1 aortic dissection s/p repair  Jen Mow, PA-C [facility] Pager: [phone] 05/20/2019, 10:14 AM   I have seen and examined this patient and agree with plan and assessment in the above note with renal recommendations/intervention highlighted.  STates that she is breathing better after "they pulled the fluid off".  Reluctant to agree with HD but discussed the need for clearance of potassium and other waste products.  Broadus John A Winton Offord,MD 05/20/2019 2:12 PM

## 2019-05-20 NOTE — Progress Notes (Signed)
Nurse spoke with Maylene Roes, DO, verbal orders given to hold scheduled metoprolol, okay to give the rest of PO morning medications.

## 2019-05-20 NOTE — Progress Notes (Signed)
PROGRESS NOTE    Monique Henderson  WUJ:[id number] DOB: 02/13/1951 DOA: 05/18/2019 PCP: [name], MD     Brief Narrative:  Monique Henderson is a 68 y.o. 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  She was referred from the outpatient hemodialysis center to the emergency room because of complaints of shortness of breath.  She said she has been feeling short of breath and having chest pain since she had her heart surgery on 04/04/2019.  Chest pain is usually across her chest and this is nothing new since her surgery.  However, shortness of breath has progressively worsened.  Shortness of breath is worsened by mild exertion while talking.  CT chest in the emergency department was negative for PE, but did find an increased left pleural effusion.  Cardiothoracic surgery team recommended patient to undergo thoracentesis.  Patient underwent thoracentesis 12/18 with 320 mL serosanguineous fluid removed.  New events last 24 hours / Subjective: Overnight, patient had an episode of lethargy and not responding to questions.  Due to concern for sudden change of her level of consciousness, neurology was consulted and patient underwent CT head.  This was thought to be secondary to toxic metabolic encephalopathy but could not rule out seizures.  Patient was given benzodiazepine as well as fosphenytoin.  This morning, patient is alert, awake although drowsy.  She is able to answer all questions and is oriented x3.  She is able to follow all commands.  During the examination, she did have an episode of staring off for about 1 to 2 seconds, which resolved spontaneously.  Assessment & Plan:   Active Problems:   Essential hypertension   Pleural effusion on left  Atrial fibrillation with RVR (HCC)   Malnutrition of moderate degree   Moderate left pleural effusion -Status post thoracentesis 12/18 with 320 mL serosanguineous fluid removed.  Pleural studies suggestive of exudative fluid.  Culture pending. -Repeat chest x-ray 12/18 without postprocedural pneumothorax -Repeat chest x-ray 12/18 small left effusion with left base atelectasis  Acute toxic metabolic encephalopathy -CT head without acute intracranial abnormality -Ammonia 22 -Last morphine administration was 6:24 AM on 12/18, last oxycodone administration 9:25 AM 12/18, last Lyrica administration 11:06 PM on 12/18.  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 Orvan Seen 04/04/2019 -Aspirin  Paroxysmal atrial fibrillation with RVR -Patient did receive IV Cardizem drip at the time of admission -She had recently finished amiodarone therapy as an outpatient and now is no longer taking amiodarone -Back to normal sinus rhythm.  Continue metoprolol  ESRD on dialysis -Nephrology consulted  Hypertension -Hydralazine, metoprolol  Elevated troponin -Demand ischemia, flat trend without concern for ACS    DVT prophylaxis: Subcutaneous heparin Code Status: Full code Family Communication: No family at bedside.  Could not reach significant other over the phone.  Tried to discuss with sister over the phone regarding overnight events, although our conversation was cut short due to sister continuing to scream over the phone at me  Disposition Plan: Pending clinical improvement, neurology work up    Antimicrobials:  Anti-infectives (From admission, onward)   None       Objective: Vitals:   05/20/19 0500 05/20/19 0602 05/20/19 0746 05/20/19 0800  BP:  102/60 Marland Kitchen)  97/58 115/68  Pulse:   75 76  Resp:   15 16  Temp: 99.3 F (37.4 C)     TempSrc: Oral      SpO2:   100% 100%  Weight:      Height:        Intake/Output Summary (Last 24 hours) at 05/20/2019 0953 Last data filed at 05/20/2019 0435 Gross per 24 hour  Intake 187.71 ml  Output -  Net 187.71 ml   Filed Weights   05/19/19 0500 05/19/19 0530 05/20/19 0453  Weight: 47.9 kg 47.9 kg 48.9 kg    Examination: General exam: Appears calm and comfortable  Respiratory system: Clear to auscultation. Respiratory effort normal. Cardiovascular system: S1 & S2 heard, RRR. No pedal edema. Gastrointestinal system: Abdomen is nondistended, soft and nontender. Normal bowel sounds heard. Central nervous system: Alert and oriented x3. Non focal exam. Moving all extremities, no facial droop, follows commands appropriately. Speech clear. During exam, had an episode of staring off into space for about 1-2 seconds   Extremities: Symmetric in appearance bilaterally  Skin: No rashes, lesions or ulcers on exposed skin  Psychiatry: Stable    Data Reviewed: I have personally reviewed following labs and imaging studies  CBC: Recent Labs  Lab 05/13/19 0958 05/18/19 1152 05/19/19 0423 05/20/19 0243  WBC 9.4 11.2* 10.4 9.8  HGB 10.2* 10.8* 9.5* 8.6*  HCT 32.9* 35.1* 30.9* 28.9*  MCV 92.9 91.6 91.7 94.1  PLT 427* 683* 629* 423*   Basic Metabolic Panel: Recent Labs  Lab 05/13/19 0958 05/18/19 1152 05/19/19 0423 05/20/19 0243  NA 137 135 133* 132*  K 4.1 4.6 4.9 4.5  CL 97* 95* 97* 96*  CO2 28 21* 20* 24  GLUCOSE 114* 93 92 166*  BUN 15 5* 15 21  CREATININE 3.37* 2.20* 3.95* 6.53*  CALCIUM 8.9 8.6* 8.8* 8.8*   GFR: Estimated Creatinine Clearance: 6.5 mL/min (A) (by C-G formula based on SCr of 6.53 mg/dL (H)). Liver Function Tests: Recent Labs  Lab 05/13/19 0958 05/18/19 1152 05/20/19 0243  AST 12* 14* 11*  ALT 7 8 8   ALKPHOS 72 73 66  BILITOT 1.1 1.0 0.3  PROT 7.8 7.8 6.2*  ALBUMIN 2.9* 2.6* 2.2*   No results for input(s): LIPASE, AMYLASE in the last 168 hours. Recent Labs   Lab 05/20/19 0243  AMMONIA 22   Coagulation Profile: No results for input(s): INR, PROTIME in the last 168 hours. Cardiac Enzymes: No results for input(s): CKTOTAL, CKMB, CKMBINDEX, TROPONINI in the last 168 hours. BNP (last 3 results) No results for input(s): PROBNP in the last 8760 hours. HbA1C: No results for input(s): HGBA1C in the last 72 hours. CBG: Recent Labs  Lab 05/19/19 2139  GLUCAP 123*   Lipid Profile: No results for input(s): CHOL, HDL, LDLCALC, TRIG, CHOLHDL, LDLDIRECT in the last 72 hours. Thyroid Function Tests: No results for input(s): TSH, T4TOTAL, FREET4, T3FREE, THYROIDAB in the last 72 hours. Anemia Panel: No results for input(s): VITAMINB12, FOLATE, FERRITIN, TIBC, IRON, RETICCTPCT in the last 72 hours. Sepsis Labs: No results for input(s): PROCALCITON, LATICACIDVEN in the last 168 hours.  Recent Results (from the past 240 hour(s))  SARS CORONAVIRUS 2 (TAT 6-24 HRS) Nasopharyngeal Nasopharyngeal Swab     Status: None   Collection Time: 05/18/19 11:56 AM   Specimen: Nasopharyngeal Swab  Result Value Ref Range Status   SARS Coronavirus 2 NEGATIVE NEGATIVE Final    Comment: (NOTE) SARS-CoV-2 target nucleic acids are NOT DETECTED. The SARS-CoV-2 RNA  is generally detectable in upper and lower respiratory specimens during the acute phase of infection. Negative results do not preclude SARS-CoV-2 infection, do not rule out co-infections with other pathogens, and should not be used as the sole basis for treatment or other patient management decisions. Negative results must be combined with clinical observations, patient history, and epidemiological information. The expected result is Negative. Fact Sheet for Patients: SugarRoll.be Fact Sheet for Healthcare Providers: https://www.woods-mathews.com/ This test is not yet approved or cleared by the [place] FDA and  has been authorized for detection and/or  diagnosis of SARS-CoV-2 by FDA under an Emergency Use Authorization (EUA). This EUA will remain  in effect (meaning this test can be used) for the duration of the COVID-19 declaration under Section 56 4(b)(1) of the Act, 21 U.S.C. section 360bbb-3(b)(1), unless the authorization is terminated or revoked sooner. Performed at [facility], [place] [address] [place], [place] [address]   Body fluid culture     Status: None (Preliminary result)   Collection Time: 05/19/19  9:33 AM   Specimen: Lung, Left; Pleural Fluid  Result Value Ref Range Status   Specimen Description PLEURAL LEFT  Final   Special Requests NONE  Final   Gram Stain   Final    RARE WBC PRESENT, PREDOMINANTLY MONONUCLEAR NO ORGANISMS SEEN Performed at [facility], [place] [address], [place], [place] [address]    Culture PENDING  Incomplete   Report Status PENDING  Incomplete  MRSA PCR Screening     Status: None   Collection Time: 05/20/19  6:39 AM   Specimen: Nasal Mucosa; Nasopharyngeal  Result Value Ref Range Status   MRSA by PCR NEGATIVE NEGATIVE Final    Comment:        The GeneXpert MRSA Assay (FDA approved for NASAL specimens only), is one component of a comprehensive MRSA colonization surveillance program. It is not intended to diagnose MRSA infection nor to guide or monitor treatment for MRSA infections. Performed at [facility], [place] [address] [place], [place] [address]       Radiology Studies: DG Chest 1 View  Result Date: 05/19/2019 CLINICAL DATA:  Post left-sided thoracentesis. EXAM: CHEST  1 VIEW COMPARISON:  05/18/2019 FINDINGS: Sternotomy wires and right IJ dialysis catheter unchanged. Lungs are adequately inflated with moderate interval improvement in left-sided pleural effusion with small residual pleural effusions/atelectasis present. No left-sided pneumothorax. Right lung is clear. Cardiomediastinal silhouette and remainder of the exam is unchanged. IMPRESSION: Significant  improvement in left pleural effusion post thoracentesis with small residual pleural effusion/basilar atelectasis. No pneumothorax. Electronically Signed   By: Marin Olp M.D.   On: 05/19/2019 09:00   CT HEAD WO CONTRAST  Result Date: 05/19/2019 CLINICAL DATA:  Altered mental status. EXAM: CT HEAD WITHOUT CONTRAST TECHNIQUE: Contiguous axial images were obtained from the base of the skull through the vertex without intravenous contrast. COMPARISON:  None. 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 No acute intracranial abnormality. Specifically, no hemorrhage, hydrocephalus, mass lesion, acute infarction, or significant intracranial injury. Vascular: No hyperdense vessel or unexpected calcification. Skull: No acute calvarial abnormality. Sinuses/Orbits: Visualized paranasal sinuses and mastoids clear. Orbital soft tissues unremarkable. 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 No acute intracranial abnormality. Electronically Signed   By: Rolm Baptise M.D.   On: 05/19/2019 22:41   CT Angio Chest PE W and/or Wo Contrast  Result Date: 05/18/2019 CLINICAL DATA:  Shortness of breath. EXAM:  CT ANGIOGRAPHY CHEST WITH CONTRAST TECHNIQUE: Multidetector CT imaging of the chest was performed using the standard protocol during bolus administration of intravenous contrast. Multiplanar CT image reconstructions and MIPs were obtained to evaluate the vascular anatomy. CONTRAST:  131mL OMNIPAQUE IOHEXOL 350 MG/ML SOLN COMPARISON:  CT chest dated May 13, 2019. FINDINGS: Cardiovascular: Satisfactory opacification of the pulmonary arteries to the segmental level. No evidence of pulmonary embolism. Unchanged mild cardiomegaly and small pericardial effusion. Prior ascending thoracic aortic graft repair. No dissection or aneurysm. Thoracic aortic and branch vessel atherosclerotic vascular disease. Tunneled right internal jugular dialysis  catheter with tip at the cavoatrial junction. Mediastinum/Nodes: No enlarged mediastinal, hilar, or axillary lymph nodes. Thyroid gland, trachea, and esophagus demonstrate no significant findings. Lungs/Pleura: Enlarging now moderate left pleural effusion with near complete collapse of the left lower lobe. New subsegmental atelectasis in the lingula. No focal consolidation, pleural effusion, or pneumothorax. Unchanged scarring at the right lung base. No suspicious pulmonary nodule. Upper Abdomen: No acute abnormality. Musculoskeletal: No chest wall abnormality. No acute or significant osseous findings. Review of the MIP images confirms the above findings. IMPRESSION: 1.  No evidence of pulmonary embolism. 2. Enlarging now moderate left pleural effusion with near complete collapse of the left lower lobe. 3. Unchanged small pericardial effusion. 4.  Aortic atherosclerosis (ICD10-I70.0). Electronically Signed   By: Titus Dubin M.D.   On: 05/18/2019 16:46   DG Chest Port 1 View  Result Date: 05/19/2019 CLINICAL DATA:  Shortness of breath EXAM: PORTABLE CHEST 1 VIEW COMPARISON:  05/19/2019 FINDINGS: Prior CABG. Cardiomegaly. Small left pleural effusion with left base atelectasis. Right lung clear. No acute bony abnormality. IMPRESSION: Cardiomegaly.  Small left effusion with left base atelectasis. Electronically Signed   By: Rolm Baptise M.D.   On: 05/19/2019 23:11   DG Chest Port 1 View  Result Date: 05/18/2019 CLINICAL DATA:  Chest pain EXAM: PORTABLE CHEST 1 VIEW COMPARISON:  Five days ago FINDINGS: Mild increase in left pleural effusion obscuring the left lower lobe which was atelectatic on the recent chest CT. New mild right base atelectasis. Postoperative heart. The right lung is clear. Dialysis catheter on the right with tip at the upper cavoatrial junction. IMPRESSION: Progression of left pleural effusion and atelectasis seen on recent chest CT. Electronically Signed   By: Monte Fantasia M.D.   On:  05/18/2019 11:49   IR THORACENTESIS ASP PLEURAL SPACE W/IMG GUIDE  Result Date: 05/19/2019 INDICATION: Shortness of breath. Left-sided pleural effusion. Request for diagnostic therapeutic thoracentesis. EXAM: ULTRASOUND GUIDED LEFT THORACENTESIS MEDICATIONS: None. COMPLICATIONS: None immediate. PROCEDURE: An ultrasound guided thoracentesis was thoroughly discussed with the patient and questions answered. The benefits, risks, alternatives and complications were also discussed. The patient understands and wishes to proceed with the procedure. Written consent was obtained. Ultrasound was performed to localize and mark an adequate pocket of fluid in the left chest. The area was then prepped and draped in the normal sterile fashion. 1% Lidocaine was used for local anesthesia. Under ultrasound guidance a 6 Fr Safe-T-Centesis catheter was introduced. Thoracentesis was performed. The catheter was removed and a dressing applied. FINDINGS: A total of approximately 320 mL of serosanguineous fluid was removed. Samples were sent to the laboratory as requested by the clinical team. IMPRESSION: Successful ultrasound guided left thoracentesis yielding 320 mL of pleural fluid. Read by: Ascencion Dike PA-C Electronically Signed   By: Aletta Edouard M.D.   On: 05/19/2019 09:13      Scheduled Meds: . aspirin EC  325  mg Oral Daily  . Chlorhexidine Gluconate Cloth  6 each Topical Q0600  . famotidine  10 mg Oral QHS  . feeding supplement  1 Container Oral BID BM  . feeding supplement (PRO-STAT SUGAR FREE 64)  30 mL Oral BID BM  . heparin  5,000 Units Subcutaneous Q8H  . hydrALAZINE  25 mg Oral Q8H  . metoprolol tartrate  50 mg Oral BID  . multivitamin  1 tablet Oral QHS   Continuous Infusions: . sodium chloride 1,000 mL (05/20/19 0029)     LOS: 2 days      Time spent: 40 minutes   Dessa Phi, DO [facility] 05/20/2019, 9:53 AM   Available via Epic secure chat 7am-7pm After these hours, please  refer to coverage provider listed on [email or web address]

## 2019-05-21 ENCOUNTER — Inpatient Hospital Stay (HOSPITAL_COMMUNITY): Payer: Medicare Other

## 2019-05-21 DIAGNOSIS — I63412 Cerebral infarction due to embolism of left middle cerebral artery: Secondary | ICD-10-CM

## 2019-05-21 LAB — CBC
HCT: 29.8 % — ABNORMAL LOW (ref 36.0–46.0)
Hemoglobin: 9 g/dL — ABNORMAL LOW (ref 12.0–15.0)
MCH: 27.8 pg (ref 26.0–34.0)
MCHC: 30.2 g/dL (ref 30.0–36.0)
MCV: 92 fL (ref 80.0–100.0)
Platelets: 623 10*3/uL — ABNORMAL HIGH (ref 150–400)
RBC: 3.24 MIL/uL — ABNORMAL LOW (ref 3.87–5.11)
RDW: 17.6 % — ABNORMAL HIGH (ref 11.5–15.5)
WBC: 9.7 10*3/uL (ref 4.0–10.5)
nRBC: 0 % (ref 0.0–0.2)

## 2019-05-21 LAB — BASIC METABOLIC PANEL
Anion gap: 11 (ref 5–15)
BUN: 18 mg/dL (ref 8–23)
CO2: 28 mmol/L (ref 22–32)
Calcium: 8.3 mg/dL — ABNORMAL LOW (ref 8.9–10.3)
Chloride: 97 mmol/L — ABNORMAL LOW (ref 98–111)
Creatinine, Ser: 4.02 mg/dL — ABNORMAL HIGH (ref 0.44–1.00)
GFR calc Af Amer: 13 mL/min — ABNORMAL LOW (ref >60)
GFR calc non Af Amer: 11 mL/min — ABNORMAL LOW (ref >60)
Glucose, Bld: 109 mg/dL — ABNORMAL HIGH (ref 70–99)
Potassium: 4 mmol/L (ref 3.5–5.1)
Sodium: 136 mmol/L (ref 135–145)

## 2019-05-21 LAB — GLUCOSE, CAPILLARY
Glucose-Capillary: 139 mg/dL — ABNORMAL HIGH (ref 70–99)
Glucose-Capillary: 136 mg/dL — ABNORMAL HIGH (ref 70–99)

## 2019-05-21 LAB — PHOSPHORUS: Phosphorus: 3.8 mg/dL (ref 2.5–4.6)

## 2019-05-21 MED ORDER — OXYCODONE HCL 5 MG PO TABS
5.0000 mg | ORAL_TABLET | Freq: Once | ORAL | Status: AC
  Administered 2019-05-21: 5 mg via ORAL
  Filled 2019-05-21: qty 1

## 2019-05-21 NOTE — Progress Notes (Signed)
Reviewed MRI brain  - shows small acute left parietal infarction   Recommendations - continue ASA 325mg , however consider anticoagulation if no contraindication - Echo, carotid doppler, MR angiogram  - Lipid profile and AIC - neurochecks q4h - Swallow eval  Stroke team to follow

## 2019-05-21 NOTE — Progress Notes (Addendum)
[facility] Progress Note   Subjective:   Patient seen and examined in room.  Sitting in bedside chair.  Alert and oriented to person, place and time.  Denies SOB, CP, n/v/d, abdominal pain, dizziness, weakness and fatigue.   Objective Vitals:   05/21/19 0200 05/21/19 0300 05/21/19 0400 05/21/19 0915  BP: 124/73 112/70 (!) 152/82 96/67  Pulse: 84 84 83 79  Resp: 18 19 (!) 22 20  Temp:   98.6 F (37 C) 98.5 F (36.9 C)  TempSrc:   Oral Oral  SpO2: 100% 100% 100% 100%  Weight:   48.8 kg   Height:       Physical Exam General:NAD, chronically ill appearing female, sitting in bedside chair Heart:RRR Lungs:breath sounds decreased, nml WOb on 2L O2 via [place] Abdomen:soft, NTND Extremities:no LE edema Dialysis Access: LU AVF maturing +b, [facility]   Filed Weights   05/20/19 1236 05/20/19 1621 05/21/19 0400  Weight: 49.3 kg 47.8 kg 48.8 kg    Intake/Output Summary (Last 24 hours) at 05/21/2019 1112 Last data filed at 05/21/2019 0819 Gross per 24 hour  Intake 340 ml  Output 500 ml  Net -160 ml    Additional Objective Labs: Basic Metabolic Panel: Recent Labs  Lab 05/19/19 0423 05/20/19 0243 05/21/19 0430  NA 133* 132* 136  K 4.9 4.5 4.0  CL 97* 96* 97*  CO2 20* 24 28  GLUCOSE 92 166* 109*  BUN 15 21 18   CREATININE 3.95* 6.53* 4.02*  CALCIUM 8.8* 8.8* 8.3*   Liver Function Tests: Recent Labs  Lab 05/18/19 1152 05/20/19 0243  AST 14* 11*  ALT 8 8  ALKPHOS 73 66  BILITOT 1.0 0.3  PROT 7.8 6.2*  ALBUMIN 2.6* 2.2*   No results for input(s): LIPASE, AMYLASE in the last 168 hours. CBC: Recent Labs  Lab 05/18/19 1152 05/19/19 0423 05/20/19 0243 05/21/19 0430  WBC 11.2* 10.4 9.8 9.7  HGB 10.8* 9.5* 8.6* 9.0*  HCT 35.1* 30.9* 28.9* 29.8*  MCV 91.6 91.7 94.1 92.0  PLT 683* 629* 587* 623*   Blood Culture    Component Value Date/Time   SDES PLEURAL LEFT 05/19/2019 0933   SPECREQUEST NONE 05/19/2019 0933   CULT  05/19/2019 0933    NO GROWTH 2  DAYS Performed at [place] [address] [place], [place] [address]    REPTSTATUS PENDING 05/19/2019 5053    Cardiac Enzymes: No results for input(s): CKTOTAL, CKMB, CKMBINDEX, TROPONINI in the last 168 hours. CBG: Recent Labs  Lab 05/19/19 2139  GLUCAP 123*   Iron Studies: No results for input(s): IRON, TIBC, TRANSFERRIN, FERRITIN in the last 72 hours. Lab Results  Component Value Date   INR 1.2 04/20/2019   INR 1.3 (H) 04/04/2019   INR 1.0 04/03/2019   Studies/Results: EEG  Result Date: 05/20/2019 [name], MD     05/20/2019 12:08 PM ELECTROENCEPHALOGRAM REPORT Patient: Monique Henderson       Room #: 9J67H EEG No. ID: [id number] Age: 67 y.o.        Sex: female Referring Physician: Maylene Roes Report Date:  05/20/2019       Interpreting Physician: [name] History: Monique Henderson is an 68 y.o. female with altered mental status Medications: ASA, Apresoline, Lopressor Conditions of Recording:  This is a 21 channel routine scalp EEG performed with bipolar and monopolar montages arranged in accordance to the international 10/20 system of electrode placement. One channel was dedicated to EKG recording. The patient is in  the awake and drowsy states. Description:  The waking background activity consists of a low voltage, symmetrical, fairly well organized, 7 Hz theta activity, seen from the parieto-occipital and posterior temporal regions.  Low voltage fast activity, poorly organized, is seen anteriorly and is at times superimposed on more posterior regions.  A mixture of theta and alpha rhythms are seen from the central and temporal regions. The patient drowses with slowing to irregular, low voltage theta and beta activity.  Stage II sleep is not obtained. No epileptiform activity is noted.  Hyperventilation and intermittent photic stimulation were not performed. IMPRESSION: This is an abnormal EEG secondary to mild posterior background slowing.  This finding may be seen  with a diffuse gray matter disturbance that is etiologically nonspecific, but may include a dementia, among other possibilities.  No epileptiform activity is noted.  [name], MD Neurology [phone] 05/20/2019, 12:04 PM   CT HEAD WO CONTRAST  Result Date: 05/19/2019 CLINICAL DATA:  Altered mental status. EXAM: CT HEAD WITHOUT CONTRAST TECHNIQUE: Contiguous axial images were obtained from the base of the skull through the vertex without intravenous contrast. COMPARISON:  None. 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 No acute intracranial abnormality. Specifically, no hemorrhage, hydrocephalus, mass lesion, acute infarction, or significant intracranial injury. Vascular: No hyperdense vessel or unexpected calcification. Skull: No acute calvarial abnormality. Sinuses/Orbits: Visualized paranasal sinuses and mastoids clear. Orbital soft tissues unremarkable. 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 No acute intracranial abnormality. Electronically Signed   By: Rolm Baptise M.D.   On: 05/19/2019 22:41   DG Chest Port 1 View  Result Date: 05/19/2019 CLINICAL DATA:  Shortness of breath EXAM: PORTABLE CHEST 1 VIEW COMPARISON:  05/19/2019 FINDINGS: Prior CABG. Cardiomegaly. Small left pleural effusion with left base atelectasis. Right lung clear. No acute bony abnormality. IMPRESSION: Cardiomegaly.  Small left effusion with left base atelectasis. Electronically Signed   By: Rolm Baptise M.D.   On: 05/19/2019 23:11    Medications: . sodium chloride 1,000 mL (05/20/19 0029)   . aspirin EC  325 mg Oral Daily  . Chlorhexidine Gluconate Cloth  6 each Topical Q0600  . doxercalciferol  1 mcg Intravenous Q T,Th,Sa-HD  . famotidine  10 mg Oral QHS  . feeding supplement  1 Container Oral BID BM  . feeding supplement (PRO-STAT SUGAR FREE 64)  30 mL Oral BID BM  . heparin  5,000 Units Subcutaneous Q8H  . hydrALAZINE  25  mg Oral Q8H  . metoprolol tartrate  50 mg Oral BID  . multivitamin  1 tablet Oral QHS    Dialysis Orders: TTS - AF  4hrs, BFR 400, DFR 800,  EDW 49kg, 3K/ 2.25Ca  Access: LU AVF maturing, TDC  Heparin none Mircera 200 mcg q2wks - last 12/15 Venofer 100mg  qHD x10 (8 completed) Hectorol 52mcg IV qHD     Assessment/Plan: 1.  SOB 2/2 pleural effusion - s/p thoracentesis. Improved. Per primary 2. Altered mental status - Improved today. CT negative. Neuro consulting.  MRI ordered for today, EEG with mild posterior background slowing, with multiple possible etiologies. Per primary/neuro  3.  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  HD yesterday tolerated well.  K 4.0 today.  Next HD on 12/22. 4.  Hypertension/volume  - BP variable, mostly well controlled, on metoprolol 50mg  BID and hydralazine 25mg  TID. Does not appear volume overloaded. Minimal gains at OP HD. Net UF removed 55mL. 5.  Anemia of CKD - Hgb 9.0.  Recently dosed with ESA. Continue to follow.  6.  Secondary Hyperparathyroidism -  Ca in goal. Will check phos.  Continue VDRA. Not on binders.  7.  Nutrition - Renal diet w/fluid restrictions 8. Type 1 aortic dissection s/p repair  Jen Mow, PA-C [facility] Pager: [phone] 05/21/2019,11:12 AM  LOS: 3 days   I have seen and examined this patient and agree with plan and assessment in the above note with renal recommendations/intervention highlighted.  Tolerated HD yesterday.  Slowly improving.  Broadus John A Glendon Dunwoody,MD 05/21/2019 1:48 PM

## 2019-05-21 NOTE — Progress Notes (Addendum)
PROGRESS NOTE    Monique Henderson  ZOX:[id number] DOB: 01/22/1951 DOA: 05/18/2019 PCP: [name], MD     Brief Narrative:  Monique Henderson is a 68 y.o. 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  She was referred from the outpatient hemodialysis center to the emergency room because of complaints of shortness of breath.  She said she has been feeling short of breath and having chest pain since she had her heart surgery on 04/04/2019.  Chest pain is usually across her chest and this is nothing new since her surgery.  However, shortness of breath has progressively worsened.  Shortness of breath is worsened by mild exertion while talking.  CT chest in the emergency department was negative for PE, but did find an increased left pleural effusion.  Cardiothoracic surgery team recommended patient to undergo thoracentesis.  Patient underwent thoracentesis 12/18 with 320 mL serosanguineous fluid removed.  Overnight 12/18-12/19, patient had an episode of lethargy and not responding to questions.  Due to concern for sudden change of her level of consciousness, neurology was consulted and patient underwent CT head.  This was thought to be secondary to toxic metabolic encephalopathy but could not rule out seizures.  Patient was given benzodiazepine as well as fosphenytoin.  New events last 24 hours / Subjective: No further events. Underwent HD yesterday without issue. Sitting in chair, no complaints today. Wants to go home.    Assessment & Plan:   Active Problems:   Essential hypertension   Pleural effusion on left   Atrial fibrillation with RVR (HCC)   Malnutrition of moderate degree   Moderate left pleural effusion -Status post thoracentesis 12/18 with  320 mL serosanguineous fluid removed.  Pleural studies suggestive of exudative fluid.  Culture pending. -Repeat chest x-ray 12/18 without postprocedural pneumothorax -Repeat chest x-ray 12/18 small left effusion with left base atelectasis  Acute toxic metabolic encephalopathy -CT head without acute intracranial abnormality -Ammonia 22 -EEG abnormal secondary to mild posterior background slowing.  This finding may be seen with a diffuse gray matter disturbance that is etiologically nonspecific, but may include a dementia, among other possibilities.  No epileptiform activity is noted. -Resolved  Left parietal CVA  -MRI: Small acute left parietal white matter infarct. Moderate to severe chronic small vessel ischemic disease. 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 Orvan Seen 04/04/2019 -Aspirin  Paroxysmal atrial fibrillation with RVR -Patient did receive IV Cardizem drip at the time of admission -She had recently finished amiodarone therapy as an outpatient and now is no longer taking amiodarone -Back to normal sinus rhythm.  Continue metoprolol  ESRD on dialysis -Nephrology following   Hypertension -Hydralazine, metoprolol  Elevated troponin -Demand ischemia, flat trend without concern for ACS    DVT prophylaxis: Subcutaneous heparin Code Status: Full code Family Communication: No family at bedside Disposition Plan: Pending clinical improvement, neurology work up. Home health on discharge.    Antimicrobials:  Anti-infectives (From admission, onward)   None       Objective: Vitals:   05/21/19 0300 05/21/19 0400 05/21/19 0915 05/21/19 1317  BP: 112/70 (!) 152/82 96/67 (!) 143/80  Pulse: 84 83 79 89  Resp: 19 (!) 22 20   Temp:  98.6 F (37 C) 98.5 F (36.9 C)   TempSrc:  Oral Oral   SpO2: 100% 100% 100%   Weight:  48.8 kg    Height:        Intake/Output Summary (Last 24 hours) at  05/21/2019 1526 Last data filed at 05/21/2019 0819 Gross per 24 hour  Intake 340 ml  Output 500 ml  Net -160 ml   Filed Weights   05/20/19 1236 05/20/19 1621 05/21/19 0400  Weight: 49.3 kg 47.8 kg 48.8 kg    Examination: General exam: Appears calm and comfortable  Respiratory system: Diminished breath sounds but no distress noted. Respiratory effort normal. Cardiovascular system: S1 & S2 heard, RRR. No pedal edema. Gastrointestinal system: Abdomen is nondistended, soft and nontender. Normal bowel sounds heard. Central nervous system: Alert and oriented. Non focal exam. Speech clear  Extremities: Symmetric in appearance bilaterally  Skin: No rashes, lesions or ulcers on exposed skin  Psychiatry: Stable    Data Reviewed: I have personally reviewed following labs and imaging studies  CBC: Recent Labs  Lab 05/18/19 1152 05/19/19 0423 05/20/19 0243 05/21/19 0430  WBC 11.2* 10.4 9.8 9.7  HGB 10.8* 9.5* 8.6* 9.0*  HCT 35.1* 30.9* 28.9* 29.8*  MCV 91.6 91.7 94.1 92.0  PLT 683* 629* 587* 416*   Basic Metabolic Panel: Recent Labs  Lab 05/18/19 1152 05/19/19 0423 05/20/19 0243 05/21/19 0430  NA 135 133* 132* 136  K 4.6 4.9 4.5 4.0  CL 95* 97* 96* 97*  CO2 21* 20* 24 28  GLUCOSE 93 92 166* 109*  BUN 5* 15 21 18   CREATININE 2.20* 3.95* 6.53* 4.02*  CALCIUM 8.6* 8.8* 8.8* 8.3*  PHOS  --   --   --  3.8   GFR: Estimated Creatinine Clearance: 10.6 mL/min (A) (by C-G formula based on SCr of 4.02 mg/dL (H)). Liver Function Tests: Recent Labs  Lab 05/18/19 1152 05/20/19 0243  AST 14* 11*  ALT 8 8  ALKPHOS 73 66  BILITOT 1.0 0.3  PROT 7.8 6.2*  ALBUMIN 2.6* 2.2*   No results for input(s): LIPASE, AMYLASE in the last 168 hours. Recent Labs  Lab 05/20/19 0243  AMMONIA 22   Coagulation Profile: No results for input(s): INR, PROTIME in the last 168 hours. Cardiac Enzymes: No results for input(s): CKTOTAL, CKMB, CKMBINDEX, TROPONINI in the last 168 hours. BNP  (last 3 results) No results for input(s): PROBNP in the last 8760 hours. HbA1C: No results for input(s): HGBA1C in the last 72 hours. CBG: Recent Labs  Lab 05/19/19 2139 05/21/19 1116  GLUCAP 123* 139*   Lipid Profile: No results for input(s): CHOL, HDL, LDLCALC, TRIG, CHOLHDL, LDLDIRECT in the last 72 hours. Thyroid Function Tests: No results for input(s): TSH, T4TOTAL, FREET4, T3FREE, THYROIDAB in the last 72 hours. Anemia Panel: No results for input(s): VITAMINB12, FOLATE, FERRITIN, TIBC, IRON, RETICCTPCT in the last 72 hours. Sepsis Labs: No results for input(s): PROCALCITON, LATICACIDVEN in the last 168 hours.  Recent Results (from the past 240 hour(s))  SARS CORONAVIRUS 2 (TAT 6-24 HRS) Nasopharyngeal Nasopharyngeal Swab     Status: None   Collection Time: 05/18/19 11:56 AM   Specimen: Nasopharyngeal Swab  Result Value Ref Range Status   SARS Coronavirus 2 NEGATIVE NEGATIVE Final    Comment: (NOTE) SARS-CoV-2 target nucleic acids are NOT DETECTED. The SARS-CoV-2 RNA is generally detectable in upper and lower respiratory specimens during the acute phase of infection. Negative results do not preclude SARS-CoV-2 infection, do not rule out co-infections with other pathogens, and should not be used as the sole  basis for treatment or other patient management decisions. Negative results must be combined with clinical observations, patient history, and epidemiological information. The expected result is Negative. Fact Sheet for Patients: SugarRoll.be Fact Sheet for Healthcare Providers: https://www.woods-mathews.com/ This test is not yet approved or cleared by the [place] FDA and  has been authorized for detection and/or diagnosis of SARS-CoV-2 by FDA under an Emergency Use Authorization (EUA). This EUA will remain  in effect (meaning this test can be used) for the duration of the COVID-19 declaration under Section 56 4(b)(1) of  the Act, 21 U.S.C. section 360bbb-3(b)(1), unless the authorization is terminated or revoked sooner. Performed at [facility], [place] [address], [place], [place] [address]   Body fluid culture     Status: None (Preliminary result)   Collection Time: 05/19/19  9:33 AM   Specimen: Lung, Left; Pleural Fluid  Result Value Ref Range Status   Specimen Description PLEURAL LEFT  Final   Special Requests NONE  Final   Gram Stain   Final    RARE WBC PRESENT, PREDOMINANTLY MONONUCLEAR NO ORGANISMS SEEN    Culture   Final    NO GROWTH 2 DAYS Performed at [facility], [place] [address] [place], [place] [address]    Report Status PENDING  Incomplete  MRSA PCR Screening     Status: None   Collection Time: 05/20/19  6:39 AM   Specimen: Nasal Mucosa; Nasopharyngeal  Result Value Ref Range Status   MRSA by PCR NEGATIVE NEGATIVE Final    Comment:        The GeneXpert MRSA Assay (FDA approved for NASAL specimens only), is one component of a comprehensive MRSA colonization surveillance program. It is not intended to diagnose MRSA infection nor to guide or monitor treatment for MRSA infections. Performed at [facility], [place] [address], [place], [place] [address]       Radiology Studies: EEG  Result Date: 05/20/2019 [name], MD     05/20/2019 12:08 PM ELECTROENCEPHALOGRAM REPORT Patient: RONESHIA DREW       Room #: 1X79T EEG No. ID: [id number] Age: 68 y.o.        Sex: female Referring Physician: Maylene Roes Report Date:  05/20/2019       Interpreting Physician: [name] History: LONYA JOHANNESEN is an 68 y.o. female with altered mental status Medications: ASA, Apresoline, Lopressor Conditions of Recording:  This is a 21 channel routine scalp EEG performed with bipolar and monopolar montages arranged in accordance to the international 10/20 system of electrode placement. One channel was dedicated to EKG recording. The patient is in the awake and drowsy states.  Description:  The waking background activity consists of a low voltage, symmetrical, fairly well organized, 7 Hz theta activity, seen from the parieto-occipital and posterior temporal regions.  Low voltage fast activity, poorly organized, is seen anteriorly and is at times superimposed on more posterior regions.  A mixture of theta and alpha rhythms are seen from the central and temporal regions. The patient drowses with slowing to irregular, low voltage theta and beta activity.  Stage II sleep is not obtained. No epileptiform activity is noted.  Hyperventilation and intermittent photic stimulation were not performed. IMPRESSION: This is an abnormal EEG secondary to mild posterior background slowing.  This finding may be seen with a diffuse gray matter disturbance that is etiologically nonspecific, but may include a dementia, among other possibilities.  No epileptiform activity is noted.  [name], MD Neurology  [phone] 05/20/2019, 12:04 PM   CT HEAD WO CONTRAST  Result Date: 05/19/2019 CLINICAL DATA:  Altered mental status. EXAM: CT HEAD WITHOUT CONTRAST TECHNIQUE: Contiguous axial images were obtained from the base of the skull through the vertex without intravenous contrast. COMPARISON:  None. 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 No acute intracranial abnormality. Specifically, no hemorrhage, hydrocephalus, mass lesion, acute infarction, or significant intracranial injury. Vascular: No hyperdense vessel or unexpected calcification. Skull: No acute calvarial abnormality. Sinuses/Orbits: Visualized paranasal sinuses and mastoids clear. Orbital soft tissues unremarkable. 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 No acute intracranial abnormality. Electronically Signed   By: Rolm Baptise M.D.   On: 05/19/2019 22:41   MR BRAIN WO CONTRAST  Result Date: 05/21/2019 CLINICAL DATA:  Encephalopathy. EXAM: MRI HEAD  WITHOUT CONTRAST TECHNIQUE: Multiplanar, multiecho pulse sequences of the brain and surrounding structures were obtained without intravenous contrast. COMPARISON:  Head CT 05/19/2019 FINDINGS: Brain: There is a 1 cm curvilinear acute infarct involving left parietal white matter. A single focus of chronic microhemorrhage is noted in the right parietal lobe. Patchy to confluent T2 hyperintensities in the cerebral white matter bilaterally are nonspecific but compatible with chronic small vessel ischemic disease, moderate to severe for age. T2 hyperintensities throughout the basal ganglia and thalami likely represent a combination of chronic small vessel ischemia including chronic lacunar infarcts as well as dilated perivascular spaces. Mild cerebral atrophy is within normal limits for age. No mass, midline shift, or extra-axial fluid collection is identified. Vascular: Major intracranial vascular flow voids are preserved. Skull and upper cervical spine: No suspicious marrow lesion. Sinuses/Orbits: Unremarkable orbits. Paranasal sinuses and mastoid air cells are clear. Other: None. IMPRESSION: 1. Small acute left parietal white matter infarct. 2. Moderate to severe chronic small vessel ischemic disease. Electronically Signed   By: Logan Bores M.D.   On: 05/21/2019 14:31   DG Chest Port 1 View  Result Date: 05/19/2019 CLINICAL DATA:  Shortness of breath EXAM: PORTABLE CHEST 1 VIEW COMPARISON:  05/19/2019 FINDINGS: Prior CABG. Cardiomegaly. Small left pleural effusion with left base atelectasis. Right lung clear. No acute bony abnormality. IMPRESSION: Cardiomegaly.  Small left effusion with left base atelectasis. Electronically Signed   By: Rolm Baptise M.D.   On: 05/19/2019 23:11      Scheduled Meds: . aspirin EC  325 mg Oral Daily  . Chlorhexidine Gluconate Cloth  6 each Topical Q0600  . doxercalciferol  1 mcg Intravenous Q T,Th,Sa-HD  . famotidine  10 mg Oral QHS  . feeding supplement  1 Container Oral  BID BM  . feeding supplement (PRO-STAT SUGAR FREE 64)  30 mL Oral BID BM  . heparin  5,000 Units Subcutaneous Q8H  . hydrALAZINE  25 mg Oral Q8H  . metoprolol tartrate  50 mg Oral BID  . multivitamin  1 tablet Oral QHS   Continuous Infusions: . sodium chloride 1,000 mL (05/20/19 0029)     LOS: 3 days      Time spent: 25 minutes   Dessa Phi, DO [facility] 05/21/2019, 3:26 PM   Available via Epic secure chat 7am-7pm After these hours, please refer to coverage provider listed on [email or web address]

## 2019-05-21 NOTE — Progress Notes (Signed)
Pt gone to MRI 

## 2019-05-21 NOTE — Plan of Care (Signed)

## 2019-05-21 NOTE — Progress Notes (Signed)
OT Cancellation Note  Patient Details Name: [name] MRN: [id number] DOB: Aug 27, 1950   Cancelled Treatment:    Reason Eval/Treat Not Completed: Patient at procedure or test/ unavailable(Pt at MRI. Will follow.)  [name] 05/21/2019, 12:01 PM  Nestor Lewandowsky, OTR/L Acute Rehabilitation Services Pager: [phone] Office: [phone]

## 2019-05-21 NOTE — Evaluation (Signed)
Physical Therapy Evaluation Patient Details Name: Monique Henderson MRN: [id number] DOB: [birth year] Today's Date: 05/21/2019   History of Present Illness  Monique Henderson is a 68 y.o. 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 CT chest found left pleural effusion. S/p thoracentesis 12/18. Also with acute toxic metabolic encephalopathy; CT head negative for acute abnormality.  Clinical Impression  Pt admitted with above. On PT evaluation, pt A&Ox4, responding to questions appropriately. Pt ambulating 15 feet with walker at a supervision level. SpO2 100% on 2L O2; HR stable in 80's. Denies dyspnea on exertion. Pt presents with generalized weakness and decreased activity tolerance. Prior to admission, she lives alone, but reports she has daily assist from her sister for IADL's including cooking (lunch and dinner) and medication management. She uses [facility] for dialysis transportation. Pt would benefit from HHPT to address deficits and maximize functional independence.     Follow Up Recommendations Home health PT;Supervision - Intermittent    Equipment Recommendations  None recommended by PT    Recommendations for Other Services       Precautions / Restrictions Precautions Precautions: Fall Restrictions Weight Bearing Restrictions: No      Mobility  Bed Mobility Overal bed mobility: Modified Independent             General bed mobility comments: use of bed rail  Transfers Overall transfer level: Modified independent Equipment used: Rolling walker (2 wheeled)             General transfer comment: preferring to push up on walker  Ambulation/Gait Ambulation/Gait assistance: Supervision Gait Distance (Feet): 15 Feet Assistive  device: Rolling walker (2 wheeled) Gait Pattern/deviations: Step-through pattern;Decreased stride length;Narrow base of support;Scissoring Gait velocity: decreased   General Gait Details: Mild scissoring, cues for keeping feet apart on turns  Science writer    Modified Rankin (Stroke Patients Only)       Balance Overall balance assessment: Mild deficits observed, not formally tested                                           Pertinent Vitals/Pain Pain Assessment: Faces Faces Pain Scale: Hurts little more Pain Location: left arm Pain Descriptors / Indicators: Aching Pain Intervention(s): Monitored during session    Home Living Family/patient expects to be discharged to:: Private residence Living Arrangements: Alone Available Help at Discharge: Available PRN/intermittently;Family(sister) Type of Home: Apartment Home Access: Stairs to enter Entrance Stairs-Rails: Can reach both Entrance Stairs-Number of Steps: flight Home Layout: One level Home Equipment: Walker - 2 wheels;Kasandra Knudsen - single point Additional Comments: States the stairs to her apartment have a lift?     Prior Function Level of Independence: Needs assistance   Gait / Transfers Assistance Needed: does not use AD; denies history of falls  ADL's / Homemaking Assistance Needed: requires assist from sister for IADL's including cooking, medication management        Hand Dominance   Dominant Hand: Right    Extremity/Trunk Assessment   Upper Extremity Assessment Upper Extremity Assessment: Generalized weakness    Lower Extremity Assessment Lower Extremity Assessment: Generalized weakness       Communication  Communication: No difficulties  Cognition Arousal/Alertness: Awake/alert Behavior During Therapy: Flat affect Overall Cognitive Status: Within Functional Limits for tasks assessed                                 General Comments: Pt  A&Ox4, answer all questions appropriately, did not assess higher cognition.      General Comments      Exercises     Assessment/Plan    PT Assessment Patient needs continued PT services  PT Problem List Decreased strength;Decreased activity tolerance;Decreased balance;Decreased mobility;Pain       PT Treatment Interventions DME instruction;Gait training;Stair training;Functional mobility training;Therapeutic activities;Therapeutic exercise;Balance training;Patient/family education    PT Goals (Current goals can be found in the Care Plan section)  Acute Rehab PT Goals Patient Stated Goal: less pain PT Goal Formulation: With patient Time For Goal Achievement: 06/04/19 Potential to Achieve Goals: Good    Frequency Min 3X/week   Barriers to discharge        Co-evaluation               AM-PAC PT "6 Clicks" Mobility  Outcome Measure Help needed turning from your back to your side while in a flat bed without using bedrails?: None Help needed moving from lying on your back to sitting on the side of a flat bed without using bedrails?: None Help needed moving to and from a bed to a chair (including a wheelchair)?: None Help needed standing up from a chair using your arms (e.g., wheelchair or bedside chair)?: None Help needed to walk in hospital room?: None Help needed climbing 3-5 steps with a railing? : A Little 6 Click Score: 23    End of Session Equipment Utilized During Treatment: Oxygen Activity Tolerance: Patient tolerated treatment well Patient left: in chair;with call bell/phone within reach;with chair alarm set Nurse Communication: Mobility status PT Visit Diagnosis: Unsteadiness on feet (R26.81);Muscle weakness (generalized) (M62.81);Pain Pain - Right/Left: Left Pain - part of body: Arm    Time: 9826-4158 PT Time Calculation (min) (ACUTE ONLY): 20 min   Charges:   PT Evaluation $PT Eval Moderate Complexity: 1 Mod          Ellamae Sia, [place],  DPT Acute Rehabilitation Services Pager [phone] Office [phone]   Willy Eddy 05/21/2019, 8:41 AM

## 2019-05-21 NOTE — Progress Notes (Signed)
MRI provided report for patient. MD aware of projected timeline for imaging.

## 2019-05-21 NOTE — Progress Notes (Signed)
Family phone number dialed for patient x 2 this morning, informed this RN will return call to her family this morning as I was unable to get to the phone when they were on hold.

## 2019-05-21 NOTE — Progress Notes (Signed)
Patient c/o nausea. Requests PRN medication for this. Provided Zofran 4mg , PO.  Pt agreeable to postpone morning medications for now to give anti-emetic an opportunity to be effective first.   Pt is pending MRI today, hoping for resolution of nausea prior to transport to radiology/MRI.

## 2019-05-21 NOTE — Progress Notes (Signed)
Patient alert/awake and set up to eat breakfast during RN shift report.  Pt denies complaints at this time.

## 2019-05-21 NOTE — Progress Notes (Signed)
Pt complaining of 10/10 pain this morning. Offered PRN tramadol, but pt refused stating it gives her a "horrible headache all down her spine". MD paged, one dose of oxycodone ordered. Will monitor.

## 2019-05-22 ENCOUNTER — Inpatient Hospital Stay (HOSPITAL_COMMUNITY): Payer: Medicare Other

## 2019-05-22 ENCOUNTER — Inpatient Hospital Stay: Payer: Medicare Other | Admitting: Family Medicine

## 2019-05-22 ENCOUNTER — Ambulatory Visit: Payer: Self-pay | Admitting: Cardiothoracic Surgery

## 2019-05-22 DIAGNOSIS — I6330 Cerebral infarction due to thrombosis of unspecified cerebral artery: Secondary | ICD-10-CM | POA: Insufficient documentation

## 2019-05-22 DIAGNOSIS — I63412 Cerebral infarction due to embolism of left middle cerebral artery: Secondary | ICD-10-CM

## 2019-05-22 DIAGNOSIS — I361 Nonrheumatic tricuspid (valve) insufficiency: Secondary | ICD-10-CM

## 2019-05-22 HISTORY — DX: Cerebral infarction due to thrombosis of unspecified cerebral artery: I63.30

## 2019-05-22 LAB — BASIC METABOLIC PANEL
Anion gap: 14 (ref 5–15)
BUN: 34 mg/dL — ABNORMAL HIGH (ref 8–23)
CO2: 25 mmol/L (ref 22–32)
Calcium: 8.9 mg/dL (ref 8.9–10.3)
Chloride: 96 mmol/L — ABNORMAL LOW (ref 98–111)
Creatinine, Ser: 6.11 mg/dL — ABNORMAL HIGH (ref 0.44–1.00)
GFR calc Af Amer: 8 mL/min — ABNORMAL LOW (ref >60)
GFR calc non Af Amer: 7 mL/min — ABNORMAL LOW (ref >60)
Glucose, Bld: 108 mg/dL — ABNORMAL HIGH (ref 70–99)
Potassium: 4.4 mmol/L (ref 3.5–5.1)
Sodium: 135 mmol/L (ref 135–145)

## 2019-05-22 LAB — BODY FLUID CULTURE: Culture: NO GROWTH

## 2019-05-22 LAB — LIPID PANEL
Cholesterol: 124 mg/dL (ref 0–200)
HDL: 42 mg/dL (ref >40)
LDL Cholesterol: 56 mg/dL (ref 0–99)
Total CHOL/HDL Ratio: 3 RATIO
Triglycerides: 132 mg/dL (ref <150)
VLDL: 26 mg/dL (ref 0–40)

## 2019-05-22 LAB — CYTOLOGY - NON PAP

## 2019-05-22 MED ORDER — CHLORHEXIDINE GLUCONATE CLOTH 2 % EX PADS
6.0000 | MEDICATED_PAD | Freq: Every day | CUTANEOUS | Status: DC
  Administered 2019-05-22 – 2019-05-23 (×2): 6 via TOPICAL

## 2019-05-22 MED ORDER — OXYCODONE HCL 5 MG PO TABS
5.0000 mg | ORAL_TABLET | Freq: Four times a day (QID) | ORAL | Status: DC | PRN
  Administered 2019-05-22 – 2019-05-24 (×6): 5 mg via ORAL
  Filled 2019-05-22 (×5): qty 1

## 2019-05-22 MED ORDER — WARFARIN SODIUM 2.5 MG PO TABS
2.5000 mg | ORAL_TABLET | Freq: Once | ORAL | Status: AC
  Administered 2019-05-22: 2.5 mg via ORAL
  Filled 2019-05-22: qty 1

## 2019-05-22 MED ORDER — ALUM & MAG HYDROXIDE-SIMETH 200-200-20 MG/5ML PO SUSP
15.0000 mL | Freq: Four times a day (QID) | ORAL | Status: DC | PRN
  Administered 2019-05-22: 15 mL via ORAL
  Filled 2019-05-22: qty 30

## 2019-05-22 MED ORDER — WARFARIN - PHARMACIST DOSING INPATIENT: Freq: Every day | Status: DC

## 2019-05-22 NOTE — Progress Notes (Signed)
PROGRESS NOTE    Monique Henderson  YWV:[id number] DOB: 1951/05/01 DOA: 05/18/2019 PCP: [name], MD     Brief Narrative:  Monique Henderson is a 68 y.o. 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  She was referred from the outpatient hemodialysis center to the emergency room because of complaints of shortness of breath.  She said she has been feeling short of breath and having chest pain since she had her heart surgery on 04/04/2019.  Chest pain is usually across her chest and this is nothing new since her surgery.  However, shortness of breath has progressively worsened.  Shortness of breath is worsened by mild exertion while talking.  CT chest in the emergency department was negative for PE, but did find an increased left pleural effusion.  Cardiothoracic surgery team recommended patient to undergo thoracentesis.  Patient underwent thoracentesis 12/18 with 320 mL serosanguineous fluid removed.  Overnight 12/18-12/19, patient had an episode of lethargy and not responding to questions.  Due to concern for sudden change of her level of consciousness, neurology was consulted and patient underwent CT head.  This was thought to be secondary to toxic metabolic encephalopathy but could not rule out seizures.  Patient was given benzodiazepine as well as fosphenytoin.  New events last 24 hours / Subjective: MRI showed small acute left parietal infarction. Patient very upset to hear this news today. Otherwise no physical or new complaints.   Assessment & Plan:   Active Problems:   Essential hypertension   Pleural effusion on left   Atrial fibrillation with RVR (HCC)   Malnutrition of moderate degree   Cerebral thrombosis with cerebral  infarction   Moderate left pleural effusion -Status post thoracentesis 12/18 with 320 mL serosanguineous fluid removed.  Pleural studies suggestive of exudative fluid.  Culture pending. -Repeat chest x-ray 12/18 without postprocedural pneumothorax -Repeat chest x-ray 12/18 small left effusion with left base atelectasis  Acute left parietal CVA  -MRI: Small acute left parietal white matter infarct. Moderate to severe chronic small vessel ischemic disease. -MRA: Mild-to-moderate generalized atherosclerotic irregularity of medium size vessels. -Carotid pending  -Echo pending  -PT OT eval rec home health -Neurology following   Acute toxic metabolic encephalopathy -CT head without acute intracranial abnormality -Ammonia 22 -EEG abnormal secondary to mild posterior background slowing.  This finding may be seen with a diffuse gray matter disturbance that is etiologically nonspecific, but may include a dementia, among other possibilities.  No epileptiform activity is noted. 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 Orvan Seen 04/04/2019 -Aspirin  Paroxysmal atrial fibrillation with RVR -Patient did receive IV Cardizem drip at the time of admission -She had recently finished amiodarone therapy as an outpatient and now is no longer taking amiodarone -Back to normal sinus rhythm.  Continue metoprolol  ESRD on dialysis -Nephrology following   Hypertension -Hydralazine, metoprolol  Elevated troponin -Demand ischemia, flat trend without concern for ACS    DVT prophylaxis: Subcutaneous heparin Code Status: Full code Family Communication: No family at bedside; left voicemail for sister to call back for update  Disposition Plan: Pending clinical improvement, neurology work up. Home health on discharge.    Antimicrobials:  Anti-infectives (From admission, onward)   None       Objective: Vitals:   05/21/19 2034  05/22/19 0115 05/22/19 0500 05/22/19 0739  BP: (!) 144/82 131/70 (!) 168/88 (!) 159/82  Pulse: 92 83 84 82  Resp: 20 (!) 21 20 18   Temp: 97.8 F (36.6 C) 98.3 F (36.8 C)  98.5 F (36.9 C)  TempSrc: Oral Oral  Oral  SpO2:   99% 100%  Weight:   49.2 kg   Height:        Intake/Output Summary (Last 24 hours) at 05/22/2019 0928 Last data filed at 05/22/2019 1025 Gross per 24 hour  Intake 480 ml  Output 102 ml  Net 378 ml   Filed Weights   05/20/19 1621 05/21/19 0400 05/22/19 0500  Weight: 47.8 kg 48.8 kg 49.2 kg    Examination: General exam: Appears calm and comfortable  Respiratory system: Diminished breath sounds lower base. Respiratory effort normal without acute distress. Cardiovascular system: S1 & S2 heard, RRR. No pedal edema. Gastrointestinal system: Abdomen is nondistended, soft and nontender. Normal bowel sounds heard. Central nervous system: Alert and oriented. Non focal exam. Speech clear  Extremities: Symmetric in appearance bilaterally  Skin: No rashes, lesions or ulcers on exposed skin  Psychiatry: Judgement and insight appear stable. Mood & affect appropriate.     Data Reviewed: I have personally reviewed following labs and imaging studies  CBC: Recent Labs  Lab 05/18/19 1152 05/19/19 0423 05/20/19 0243 05/21/19 0430  WBC 11.2* 10.4 9.8 9.7  HGB 10.8* 9.5* 8.6* 9.0*  HCT 35.1* 30.9* 28.9* 29.8*  MCV 91.6 91.7 94.1 92.0  PLT 683* 629* 587* 852*   Basic Metabolic Panel: Recent Labs  Lab 05/18/19 1152 05/19/19 0423 05/20/19 0243 05/21/19 0430 05/22/19 0520  NA 135 133* 132* 136 135  K 4.6 4.9 4.5 4.0 4.4  CL 95* 97* 96* 97* 96*  CO2 21* 20* 24 28 25   GLUCOSE 93 92 166* 109* 108*  BUN 5* 15 21 18  34*  CREATININE 2.20* 3.95* 6.53* 4.02* 6.11*  CALCIUM 8.6* 8.8* 8.8* 8.3* 8.9  PHOS  --   --   --  3.8  --    GFR: Estimated Creatinine Clearance: 7 mL/min (A) (by C-G formula based on SCr of 6.11 mg/dL (H)). Liver Function Tests: Recent Labs   Lab 05/18/19 1152 05/20/19 0243  AST 14* 11*  ALT 8 8  ALKPHOS 73 66  BILITOT 1.0 0.3  PROT 7.8 6.2*  ALBUMIN 2.6* 2.2*   No results for input(s): LIPASE, AMYLASE in the last 168 hours. Recent Labs  Lab 05/20/19 0243  AMMONIA 22   Coagulation Profile: No results for input(s): INR, PROTIME in the last 168 hours. Cardiac Enzymes: No results for input(s): CKTOTAL, CKMB, CKMBINDEX, TROPONINI in the last 168 hours. BNP (last 3 results) No results for input(s): PROBNP in the last 8760 hours. HbA1C: No results for input(s): HGBA1C in the last 72 hours. CBG: Recent Labs  Lab 05/19/19 2139 05/21/19 1116 05/21/19 1627  GLUCAP 123* 139* 136*   Lipid Profile: No results for input(s): CHOL, HDL, LDLCALC, TRIG, CHOLHDL, LDLDIRECT in the last 72 hours. Thyroid Function Tests: No results for input(s): TSH, T4TOTAL, FREET4, T3FREE, THYROIDAB in the last 72 hours. Anemia Panel: No results for input(s): VITAMINB12, FOLATE, FERRITIN, TIBC, IRON, RETICCTPCT in the last 72 hours. Sepsis Labs: No results for input(s): PROCALCITON, LATICACIDVEN in the last 168 hours.  Recent Results (from the past 240 hour(s))  SARS CORONAVIRUS 2 (TAT 6-24 HRS) Nasopharyngeal Nasopharyngeal Swab     Status: None   Collection Time: 05/18/19 11:56 AM  Specimen: Nasopharyngeal Swab  Result Value Ref Range Status   SARS Coronavirus 2 NEGATIVE NEGATIVE Final    Comment: (NOTE) SARS-CoV-2 target nucleic acids are NOT DETECTED. The SARS-CoV-2 RNA is generally detectable in upper and lower respiratory specimens during the acute phase of infection. Negative results do not preclude SARS-CoV-2 infection, do not rule out co-infections with other pathogens, and should not be used as the sole basis for treatment or other patient management decisions. Negative results must be combined with clinical observations, patient history, and epidemiological information. The expected result is Negative. Fact Sheet for  Patients: SugarRoll.be Fact Sheet for Healthcare Providers: https://www.woods-mathews.com/ This test is not yet approved or cleared by the [place] FDA and  has been authorized for detection and/or diagnosis of SARS-CoV-2 by FDA under an Emergency Use Authorization (EUA). This EUA will remain  in effect (meaning this test can be used) for the duration of the COVID-19 declaration under Section 56 4(b)(1) of the Act, 21 U.S.C. section 360bbb-3(b)(1), unless the authorization is terminated or revoked sooner. Performed at [facility], [place] [address], [place], [place] [address]   Body fluid culture     Status: None (Preliminary result)   Collection Time: 05/19/19  9:33 AM   Specimen: Lung, Left; Pleural Fluid  Result Value Ref Range Status   Specimen Description PLEURAL LEFT  Final   Special Requests NONE  Final   Gram Stain   Final    RARE WBC PRESENT, PREDOMINANTLY MONONUCLEAR NO ORGANISMS SEEN    Culture   Final    NO GROWTH 2 DAYS Performed at [facility], [place] [address] [place], [place] [address]    Report Status PENDING  Incomplete  MRSA PCR Screening     Status: None   Collection Time: 05/20/19  6:39 AM   Specimen: Nasal Mucosa; Nasopharyngeal  Result Value Ref Range Status   MRSA by PCR NEGATIVE NEGATIVE Final    Comment:        The GeneXpert MRSA Assay (FDA approved for NASAL specimens only), is one component of a comprehensive MRSA colonization surveillance program. It is not intended to diagnose MRSA infection nor to guide or monitor treatment for MRSA infections. Performed at [facility], [place] [address] [place], [place] [address]       Radiology Studies: EEG  Result Date: 05/20/2019 [name], MD     05/20/2019 12:08 PM ELECTROENCEPHALOGRAM REPORT Patient: ANALYCIA KHOKHAR       Room #: 4R74Y EEG No. ID: [id number] Age: 68 y.o.        Sex: female Referring Physician: Maylene Roes Report Date:   05/20/2019       Interpreting Physician: [name] History: CALINA PATRIE is an 68 y.o. female with altered mental status Medications: ASA, Apresoline, Lopressor Conditions of Recording:  This is a 21 channel routine scalp EEG performed with bipolar and monopolar montages arranged in accordance to the international 10/20 system of electrode placement. One channel was dedicated to EKG recording. The patient is in the awake and drowsy states. Description:  The waking background activity consists of a low voltage, symmetrical, fairly well organized, 7 Hz theta activity, seen from the parieto-occipital and posterior temporal regions.  Low voltage fast activity, poorly organized, is seen anteriorly and is at times superimposed on more posterior regions.  A mixture of theta and alpha rhythms are seen from the central and temporal regions. The patient drowses with slowing to irregular, low voltage theta and  beta activity.  Stage II sleep is not obtained. No epileptiform activity is noted.  Hyperventilation and intermittent photic stimulation were not performed. IMPRESSION: This is an abnormal EEG secondary to mild posterior background slowing.  This finding may be seen with a diffuse gray matter disturbance that is etiologically nonspecific, but may include a dementia, among other possibilities.  No epileptiform activity is noted.  [name], MD Neurology [phone] 05/20/2019, 12:04 PM   MR ANGIO HEAD WO CONTRAST  Result Date: 05/22/2019 CLINICAL DATA:  Neuro deficit with stroke suspected EXAM: MRA HEAD WITHOUT CONTRAST TECHNIQUE: Angiographic images of the Circle of Willis were obtained using MRA technique without intravenous contrast. COMPARISON:  Brain MRI from yesterday FINDINGS: Symmetric carotid arteries. Undulating appearance the distal right ICA could be artifact or from fibromuscular dysplasia. Undulation at the skull base is typically artifact. Mild-to-moderate generalized  atherosclerotic irregularity of medium size vessels. No branch occlusion or proximal flow reducing stenosis. IMPRESSION: 1. Mild-to-moderate generalized atherosclerotic irregularity of medium size vessels. 2. Beaded appearance of the right more than left distal cervical ICA which could be artifact or fibromuscular dysplasia. Electronically Signed   By: Monte Fantasia M.D.   On: 05/22/2019 06:51   MR BRAIN WO CONTRAST  Result Date: 05/21/2019 CLINICAL DATA:  Encephalopathy. EXAM: MRI HEAD WITHOUT CONTRAST TECHNIQUE: Multiplanar, multiecho pulse sequences of the brain and surrounding structures were obtained without intravenous contrast. COMPARISON:  Head CT 05/19/2019 FINDINGS: Brain: There is a 1 cm curvilinear acute infarct involving left parietal white matter. A single focus of chronic microhemorrhage is noted in the right parietal lobe. Patchy to confluent T2 hyperintensities in the cerebral white matter bilaterally are nonspecific but compatible with chronic small vessel ischemic disease, moderate to severe for age. T2 hyperintensities throughout the basal ganglia and thalami likely represent a combination of chronic small vessel ischemia including chronic lacunar infarcts as well as dilated perivascular spaces. Mild cerebral atrophy is within normal limits for age. No mass, midline shift, or extra-axial fluid collection is identified. Vascular: Major intracranial vascular flow voids are preserved. Skull and upper cervical spine: No suspicious marrow lesion. Sinuses/Orbits: Unremarkable orbits. Paranasal sinuses and mastoid air cells are clear. Other: None. IMPRESSION: 1. Small acute left parietal white matter infarct. 2. Moderate to severe chronic small vessel ischemic disease. Electronically Signed   By: Logan Bores M.D.   On: 05/21/2019 14:31      Scheduled Meds: . aspirin EC  325 mg Oral Daily  . Chlorhexidine Gluconate Cloth  6 each Topical Q0600  . doxercalciferol  1 mcg Intravenous Q  T,Th,Sa-HD  . famotidine  10 mg Oral QHS  . feeding supplement  1 Container Oral BID BM  . feeding supplement (PRO-STAT SUGAR FREE 64)  30 mL Oral BID BM  . heparin  5,000 Units Subcutaneous Q8H  . hydrALAZINE  25 mg Oral Q8H  . metoprolol tartrate  50 mg Oral BID  . multivitamin  1 tablet Oral QHS   Continuous Infusions: . sodium chloride 1,000 mL (05/20/19 0029)     LOS: 4 days      Time spent: 25 minutes   Dessa Phi, DO [facility] 05/22/2019, 9:28 AM   Available via Epic secure chat 7am-7pm After these hours, please refer to coverage provider listed on [email or web address]

## 2019-05-22 NOTE — Progress Notes (Signed)
[facility] ROUNDING NOTE   Subjective:   This is a 68 year old lady with a history of  repair of ascending thoracic aortic aneurysm 04/04/2019 Dr. Orvan Seen.  She also has a history of hypertension, tobacco abuse, breast cancer atrial fibrillation end-stage renal disease Tuesday Thursday Saturday, she has hepatitis C lumbar spinal stenosis.  She was sent from the outpatient unit with complaints of shortness of breath and chest pain.  CT scan of chest in the emergency department was negative for pulmonary embolus but revealed a large left-sided pleural effusion.  Cardiothoracic surgery was consulted and they recommended thoracentesis with removal of 320 cc of serosanguineous fluid.  Hospital course was complicated by an episode of lethargy and altered mental status with subsequent CT scan of the head and MRI that showed a small acute left parietal infarct.  Blood pressure 159/82 pulse 82 temperature 98.5 O2 sats 93% on room air.  Removal of 500 cc with dialysis 05/20/2019.  Sodium 135 potassium 4.4 chloride 96 CO2 25 glucose 1086 BUN 34 creatinine 6.1 calcium 8.9  WBC 9.7 hemoglobin 9.0 platelets 623  Aspirin 325 mg daily, hydralazine 25 mg every 8 hours, metoprolol 50 mg twice daily  Objective:  Vital signs in last 24 hours:  Temp:  [97.8 F (36.6 C)-98.5 F (36.9 C)] 98.5 F (36.9 C) (12/21 0739) Pulse Rate:  [82-92] 82 (12/21 0739) Resp:  [18-21] 18 (12/21 0739) BP: (129-168)/(70-88) 159/82 (12/21 0739) SpO2:  [97 %-100 %] 100 % (12/21 0739) Weight:  [49.2 kg] 49.2 kg (12/21 0500)  Weight change: -0.1 kg Filed Weights   05/20/19 1621 05/21/19 0400 05/22/19 0500  Weight: 47.8 kg 48.8 kg 49.2 kg    Intake/Output: I/O last 3 completed shifts: In: 43 [P.O.:820] Out: 101 [Urine:100; Stool:1]   Intake/Output this shift:  Total I/O In: -  Out: 1 [Stool:1]  CVS- RRR RS- CTA ABD- BS present soft non-distended EXT- no edema   Basic Metabolic Panel: Recent Labs   Lab 05/18/19 1152 05/19/19 0423 05/20/19 0243 05/21/19 0430 05/22/19 0520  NA 135 133* 132* 136 135  K 4.6 4.9 4.5 4.0 4.4  CL 95* 97* 96* 97* 96*  CO2 21* 20* 24 28 25   GLUCOSE 93 92 166* 109* 108*  BUN 5* 15 21 18  34*  CREATININE 2.20* 3.95* 6.53* 4.02* 6.11*  CALCIUM 8.6* 8.8* 8.8* 8.3* 8.9  PHOS  --   --   --  3.8  --     Liver Function Tests: Recent Labs  Lab 05/18/19 1152 05/20/19 0243  AST 14* 11*  ALT 8 8  ALKPHOS 73 66  BILITOT 1.0 0.3  PROT 7.8 6.2*  ALBUMIN 2.6* 2.2*   No results for input(s): LIPASE, AMYLASE in the last 168 hours. Recent Labs  Lab 05/20/19 0243  AMMONIA 22    CBC: Recent Labs  Lab 05/18/19 1152 05/19/19 0423 05/20/19 0243 05/21/19 0430  WBC 11.2* 10.4 9.8 9.7  HGB 10.8* 9.5* 8.6* 9.0*  HCT 35.1* 30.9* 28.9* 29.8*  MCV 91.6 91.7 94.1 92.0  PLT 683* 629* 587* 623*    Cardiac Enzymes: No results for input(s): CKTOTAL, CKMB, CKMBINDEX, TROPONINI in the last 168 hours.  BNP: Invalid input(s): POCBNP  CBG: Recent Labs  Lab 05/19/19 2139 05/21/19 1116 05/21/19 1627  GLUCAP 123* 139* 136*    Microbiology: Results for orders placed or performed during the hospital encounter of 05/18/19  SARS CORONAVIRUS 2 (TAT 6-24 HRS) Nasopharyngeal Nasopharyngeal Swab     Status: None  Collection Time: 05/18/19 11:56 AM   Specimen: Nasopharyngeal Swab  Result Value Ref Range Status   SARS Coronavirus 2 NEGATIVE NEGATIVE Final    Comment: (NOTE) SARS-CoV-2 target nucleic acids are NOT DETECTED. The SARS-CoV-2 RNA is generally detectable in upper and lower respiratory specimens during the acute phase of infection. Negative results do not preclude SARS-CoV-2 infection, do not rule out co-infections with other pathogens, and should not be used as the sole basis for treatment or other patient management decisions. Negative results must be combined with clinical observations, patient history, and epidemiological information. The  expected result is Negative. Fact Sheet for Patients: SugarRoll.be Fact Sheet for Healthcare Providers: https://www.woods-mathews.com/ This test is not yet approved or cleared by the [place] FDA and  has been authorized for detection and/or diagnosis of SARS-CoV-2 by FDA under an Emergency Use Authorization (EUA). This EUA will remain  in effect (meaning this test can be used) for the duration of the COVID-19 declaration under Section 56 4(b)(1) of the Act, 21 U.S.C. section 360bbb-3(b)(1), unless the authorization is terminated or revoked sooner. Performed at [facility], [place] [address], [place], [place] [address]   Body fluid culture     Status: None   Collection Time: 05/19/19  9:33 AM   Specimen: Lung, Left; Pleural Fluid  Result Value Ref Range Status   Specimen Description PLEURAL LEFT  Final   Special Requests NONE  Final   Gram Stain   Final    RARE WBC PRESENT, PREDOMINANTLY MONONUCLEAR NO ORGANISMS SEEN    Culture   Final    NO GROWTH 3 DAYS Performed at [facility], [place] [address] [place], [place] [address]    Report Status 05/22/2019 FINAL  Final  MRSA PCR Screening     Status: None   Collection Time: 05/20/19  6:39 AM   Specimen: Nasal Mucosa; Nasopharyngeal  Result Value Ref Range Status   MRSA by PCR NEGATIVE NEGATIVE Final    Comment:        The GeneXpert MRSA Assay (FDA approved for NASAL specimens only), is one component of a comprehensive MRSA colonization surveillance program. It is not intended to diagnose MRSA infection nor to guide or monitor treatment for MRSA infections. Performed at [facility], [place] [address], [place], [place] [address]     Coagulation Studies: No results for input(s): LABPROT, INR in the last 72 hours.  Urinalysis: No results for input(s): COLORURINE, LABSPEC, PHURINE, GLUCOSEU, HGBUR, BILIRUBINUR, KETONESUR, PROTEINUR, UROBILINOGEN, NITRITE,  LEUKOCYTESUR in the last 72 hours.  Invalid input(s): APPERANCEUR    Imaging: EEG  Result Date: 05/20/2019 [name], MD     05/20/2019 12:08 PM ELECTROENCEPHALOGRAM REPORT Patient: Monique Henderson       Room #: 2Z30Q EEG No. ID: [id number] Age: 68 y.o.        Sex: female Referring Physician: Maylene Roes Report Date:  05/20/2019       Interpreting Physician: [name] History: NICOLINA HIRT is an 68 y.o. female with altered mental status Medications: ASA, Apresoline, Lopressor Conditions of Recording:  This is a 21 channel routine scalp EEG performed with bipolar and monopolar montages arranged in accordance to the international 10/20 system of electrode placement. One channel was dedicated to EKG recording. The patient is in the awake and drowsy states. Description:  The waking background activity consists of a low voltage, symmetrical, fairly well organized, 7 Hz theta activity, seen from the parieto-occipital and posterior temporal regions.  Low voltage fast activity, poorly organized, is seen anteriorly and is at times superimposed on more posterior regions.  A mixture of theta and alpha rhythms are seen from the central and temporal regions. The patient drowses with slowing to irregular, low voltage theta and beta activity.  Stage II sleep is not obtained. No epileptiform activity is noted.  Hyperventilation and intermittent photic stimulation were not performed. IMPRESSION: This is an abnormal EEG secondary to mild posterior background slowing.  This finding may be seen with a diffuse gray matter disturbance that is etiologically nonspecific, but may include a dementia, among other possibilities.  No epileptiform activity is noted.  [name], MD Neurology [phone] 05/20/2019, 12:04 PM   MR ANGIO HEAD WO CONTRAST  Result Date: 05/22/2019 CLINICAL DATA:  Neuro deficit with stroke suspected EXAM: MRA HEAD WITHOUT CONTRAST TECHNIQUE: Angiographic images of the Circle of Willis  were obtained using MRA technique without intravenous contrast. COMPARISON:  Brain MRI from yesterday FINDINGS: Symmetric carotid arteries. Undulating appearance the distal right ICA could be artifact or from fibromuscular dysplasia. Undulation at the skull base is typically artifact. Mild-to-moderate generalized atherosclerotic irregularity of medium size vessels. No branch occlusion or proximal flow reducing stenosis. IMPRESSION: 1. Mild-to-moderate generalized atherosclerotic irregularity of medium size vessels. 2. Beaded appearance of the right more than left distal cervical ICA which could be artifact or fibromuscular dysplasia. Electronically Signed   By: Monte Fantasia M.D.   On: 05/22/2019 06:51   MR BRAIN WO CONTRAST  Result Date: 05/21/2019 CLINICAL DATA:  Encephalopathy. EXAM: MRI HEAD WITHOUT CONTRAST TECHNIQUE: Multiplanar, multiecho pulse sequences of the brain and surrounding structures were obtained without intravenous contrast. COMPARISON:  Head CT 05/19/2019 FINDINGS: Brain: There is a 1 cm curvilinear acute infarct involving left parietal white matter. A single focus of chronic microhemorrhage is noted in the right parietal lobe. Patchy to confluent T2 hyperintensities in the cerebral white matter bilaterally are nonspecific but compatible with chronic small vessel ischemic disease, moderate to severe for age. T2 hyperintensities throughout the basal ganglia and thalami likely represent a combination of chronic small vessel ischemia including chronic lacunar infarcts as well as dilated perivascular spaces. Mild cerebral atrophy is within normal limits for age. No mass, midline shift, or extra-axial fluid collection is identified. Vascular: Major intracranial vascular flow voids are preserved. Skull and upper cervical spine: No suspicious marrow lesion. Sinuses/Orbits: Unremarkable orbits. Paranasal sinuses and mastoid air cells are clear. Other: None. IMPRESSION: 1. Small acute left parietal  white matter infarct. 2. Moderate to severe chronic small vessel ischemic disease. Electronically Signed   By: Logan Bores M.D.   On: 05/21/2019 14:31   VAS US CAROTID  Result Date: 05/22/2019 Carotid Arterial Duplex Study Indications:       CVA. Risk Factors:      Hypertension. Comparison Study:  no prior Performing Technologist: Abram Sander RVS  Examination Guidelines: A complete evaluation includes B-mode imaging, spectral Doppler, color Doppler, and power Doppler as needed of all accessible portions of each vessel. Bilateral testing is considered an integral part of a complete examination. Limited examinations for reoccurring indications may be performed as noted.  Right Carotid Findings: +----------+--------+--------+--------+------------------+--------+           PSV cm/sEDV cm/sStenosisPlaque DescriptionComments +----------+--------+--------+--------+------------------+--------+ CCA Prox  63      15                                         +----------+--------+--------+--------+------------------+--------+  CCA Distal48      20                                         +----------+--------+--------+--------+------------------+--------+ ICA Prox  104     42      40-59%                    tortuous +----------+--------+--------+--------+------------------+--------+ ICA Mid   90      31                                         +----------+--------+--------+--------+------------------+--------+ ICA Distal151     62                                         +----------+--------+--------+--------+------------------+--------+ ECA                       Occluded                           +----------+--------+--------+--------+------------------+--------+ +----------+--------+-------+--------+-------------------+           PSV cm/sEDV cmsDescribeArm Pressure (mmHG) +----------+--------+-------+--------+-------------------+ [id number]                                          +----------+--------+-------+--------+-------------------+ +---------+--------+--+--------+--+---------+ VertebralPSV cm/s65EDV cm/s18Antegrade +---------+--------+--+--------+--+---------+  Left Carotid Findings: +----------+--------+--------+--------+------------------+--------------+           PSV cm/sEDV cm/sStenosisPlaque DescriptionComments       +----------+--------+--------+--------+------------------+--------------+ CCA Prox  76      20                                               +----------+--------+--------+--------+------------------+--------------+ CCA Distal57      15                                               +----------+--------+--------+--------+------------------+--------------+ ICA Prox  136     43      40-59%                    tortuous       +----------+--------+--------+--------+------------------+--------------+ ICA Mid   66      25                                               +----------+--------+--------+--------+------------------+--------------+ ICA Distal98      34                                               +----------+--------+--------+--------+------------------+--------------+ ECA  42                                        near occlusion +----------+--------+--------+--------+------------------+--------------+ +---------+--------+--+--------+--+ VertebralPSV cm/s47EDV cm/s15 +---------+--------+--+--------+--+  Summary: Right Carotid: Velocities in the right ICA are consistent with a 40-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 The ECA appears occluded. Left Carotid: Velocities in the left ICA are consistent with a 40-59% stenosis. Vertebrals: Bilateral vertebral arteries demonstrate antegrade flow. *See table(s) above for measurements and observations.     Preliminary      Medications:   . sodium chloride 1,000 mL (05/20/19 0029)   . aspirin EC  325 mg Oral Daily  . Chlorhexidine Gluconate Cloth  6  each Topical Q0600  . doxercalciferol  1 mcg Intravenous Q T,Th,Sa-HD  . famotidine  10 mg Oral QHS  . feeding supplement  1 Container Oral BID BM  . feeding supplement (PRO-STAT SUGAR FREE 64)  30 mL Oral BID BM  . heparin  5,000 Units Subcutaneous Q8H  . hydrALAZINE  25 mg Oral Q8H  . metoprolol tartrate  50 mg Oral BID  . multivitamin  1 tablet Oral QHS   sodium chloride, albuterol, ondansetron **OR** ondansetron (ZOFRAN) IV, polyethylene glycol, traMADol  Assessment/ Plan:  1. SOB 2/2 pleural effusion - s/p thoracentesis 05/19/2019.  320 cc removed Improved.   Interestingly shows serosanguineous fluid suggestive of exudate cultures pending. 2. Altered mental status - Improved today.   MRI small acute left parietal white matter infarct moderate to severe chronic small vessel disease echo and carotid Dopplers pending neurology following.  CT no acute intracranial abnormalities ammonia level 22 EEG background slowing appears to have resolved. 3. End-stage renal disease Tuesday Thursday Saturday dialysis.-tolerated dialysis 05/20/1999 2500 cc removed we will plan next dialysis treatment 05/23/2019 4. Hypertension/volume - BP variable, mostly well controlled, on metoprolol 50mg  BID and hydralazine 25mg  TID. Does not appear volume overloaded. Minimal gains at OP HD. Net UF removed 59mL. 5. Anemia of CKD - Hgb 9.0. Recently dosed with ESA. Continue to follow.  6. Secondary Hyperparathyroidism - Ca in goal. Will check phos. Continue VDRA. Not on binders.  7. Nutrition - Renal diet w/fluid restrictions 8. 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 Orvan Seen    LOS: [place] @TODAY @10 :26 AM

## 2019-05-22 NOTE — Progress Notes (Signed)
ANTICOAGULATION CONSULT NOTE - Initial Consult  Pharmacy Consult for Warfarin Indication: stroke, atrial fibrillation  Allergies  Allergen Reactions  . Shrimp [Shellfish Allergy] Shortness Of Breath  . Bactroban [Mupirocin] Other (See Comments)    "Sores in nose"  . Vicodin [Hydrocodone-Acetaminophen] Itching and Nausea And Vomiting    This is patient's home medication  . Lisinopril Cough  . Tylenol [Acetaminophen] Itching    Patient Measurements: Height: 5\' 3"  (160 cm) Weight: 108 lb 7.5 oz (49.2 kg) IBW/kg (Calculated) : 52.4   Vital Signs: Temp: 99.4 F (37.4 C) (12/21 1224) Temp Source: Oral (12/21 1224) BP: 183/90 (12/21 1224) Pulse Rate: 88 (12/21 1224)  Labs: Recent Labs    05/20/19 0243 05/21/19 0430 05/22/19 0520  HGB 8.6* 9.0*  --   HCT 28.9* 29.8*  --   PLT 587* 623*  --   CREATININE 6.53* 4.02* 6.11*    Estimated Creatinine Clearance: 7 mL/min (A) (by C-G formula based on SCr of 6.11 mg/dL (H)).   Assessment: 68 yr old female with small acute L parietal infarction. Active medical problems include: HTN, L pleural effusion, a fib with RVR, ESRD (TTSa HD), malnutrition.  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  Pharmacy is consulted to dose warfarin (without heparin bridge) for CVA; also with afib. Pt is taking ASA 325 mg PO daily. She is also receiving heparin 5000 units subcutaneous Q 8 hrs for VTE prophylaxis.  Pt was on no anticoagulants PTA.  H/H 9.0/29.8, platelets 623 (CBC stable); wt 49.2 kg; albumin (12/19) 2.2   Goal of Therapy:  INR 2-3 Monitor platelets by anticoagulation protocol: Yes   Plan:  Warfarin 2.5 mg PO X 1 tonight Monitor daily INR, CBC Monitor for signs/symptoms of bleeding Continue heparin 5000 units SQ Q 8 hrs until INR at goal  Gillermina Hu, PharmD, BCPS, [place]  Pharmacist 05/22/2019,5:23 PM

## 2019-05-22 NOTE — TOC Initial Note (Addendum)
Transition of Care [facility]) - Initial/Assessment Note    Patient Details  Name: Monique Henderson MRN: [id number] Date of Birth: 1950/09/23  [facility] CM/SW Contact:    Zenon Mayo, RN Phone Number: 05/22/2019, 4:33 PM  Clinical Narrative:                 Patient states she lives with her sister, NCM offered choice for HHPT, she states she does not have a preference.  NCM made referral to [facility] with [facility], awaiting to hear back to see if they can take referral.  Patient states she has transportation at dc.  [facility] called back they can not take referral.  NCM made referral to [facility].  She states they can take referral.    Expected Discharge Plan: [place] Barriers to Discharge: Continued Medical Work up   Patient Goals and CMS Choice Patient states their goals for this hospitalization and ongoing recovery are:: get better CMS Medicare.gov Compare Post Acute Care list provided to:: Patient Choice offered to / list presented to : Patient  Expected Discharge Plan and Services Expected Discharge Plan: [place]   Discharge Planning Services: CM Consult Post Acute Care Choice: [place] arrangements for the past 2 months: Single Family Home                 DME Arranged: (NA)         HH Arranged: PT HH Agency: [facility] ([place] Date [place]: 05/22/19 Time [place]: 1633 Representative spoke with at [place]: [place] Arrangements/Services Living arrangements for the past 2 months: [place] with:: Siblings Patient language and need for interpreter reviewed:: Yes Do you feel safe going back to the place where you live?: Yes      Need for Family Participation in Patient Care: Yes (Comment) Care giver support system in place?: Yes (comment)   Criminal Activity/Legal Involvement Pertinent to Current  Situation/Hospitalization: No - Comment as needed  Activities of Daily Living Home Assistive Devices/Equipment: Other (Comment)(stair transfer chair) ADL Screening (condition at time of admission) Patient's cognitive ability adequate to safely complete daily activities?: Yes Is the patient deaf or have difficulty hearing?: No Does the patient have difficulty seeing, even when wearing glasses/contacts?: No Does the patient have difficulty concentrating, remembering, or making decisions?: No Patient able to express need for assistance with ADLs?: Yes Does the patient have difficulty dressing or bathing?: No Independently performs ADLs?: Yes (appropriate for developmental age) Does the patient have difficulty walking or climbing stairs?: No Weakness of Legs: Both Weakness of Arms/Hands: None  Permission Sought/Granted                  Emotional Assessment Appearance:: Appears stated age Attitude/Demeanor/Rapport: Engaged Affect (typically observed): (S) Appropriate Orientation: : Oriented to Self, Oriented to Place, Oriented to  Time, Oriented to Situation Alcohol / Substance Use: Not Applicable Psych Involvement: No (comment)  Admission diagnosis:  Atypical chest pain [R07.89] Pleural effusion on left [J90] Pleural effusion, left [J90] Patient Active Problem List   Diagnosis Date Noted  . Cerebral thrombosis with cerebral infarction 05/22/2019  . Malnutrition of moderate degree 05/19/2019  . Pleural effusion on left 05/18/2019  . Atrial fibrillation with RVR ([place]) 05/18/2019  . S/P aortic aneurysm repair 04/07/2019  . Aortic dissection ([place]) 04/04/2019  . Dissection of aorta ([place]) 04/03/2019  . Non compliance with medical  treatment 12/04/2017  . Chronic kidney disease (CKD) stage G3b/A2, moderately decreased glomerular filtration rate (GFR) between 30-44 mL/min/1.73 square meter and albuminuria creatinine ratio between 30-299 mg/g 07/19/2017  . Chronic obstructive lung  disease ([place]) 01/16/2017  . Chronic low back pain 06/22/2016  . Insomnia 03/14/2015  . S/P lumbar spinal fusion 01/18/2014  . Tobacco use disorder 04/19/2009  . Essential hypertension 09/16/2006  . GERD 09/16/2006   PCP:  [name], MD Pharmacy:   [place], [place] - [address] [place] [address] Phone: [phone] Fax: [phone]     Social Determinants of Health (SDOH) Interventions    Readmission Risk Interventions Readmission Risk Prevention Plan 05/22/2019  Transportation Screening Complete  PCP or Specialist Appt within 3-5 Days Complete  HRI or [place] Complete  Social Work Consult for [place] Planning/Counseling Complete  Palliative Care Screening Not Applicable  Medication Review Press photographer) Complete  Some recent data might be hidden

## 2019-05-22 NOTE — Evaluation (Signed)
Occupational Therapy Evaluation Patient Details Name: Monique Henderson MRN: [id number] DOB: May 21, 1951 Today's Date: 05/22/2019    History of Present Illness Monique Henderson is a 68 y.o. 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 CT chest found left pleural effusion. S/p thoracentesis 12/18. Also with acute toxic metabolic encephalopathy; CT head negative for acute abnormality. Overnight 12/18-12/19, patient had an episode of lethargy and not responding to questions. 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   Clinical Impression   Pt admitted with the above diagnoses and presents with below problem list. Pt will benefit from continued acute OT to address the below listed deficits and maximize independence with basic ADLs prior to d/c home. At baseline pt is independent with basic ADLs. Pt's sister provides assistance with instrumental ADLs (cooking, medication management). Pt currently setup to min guard with basic ADLs, functional transfers and mobility this session. Tolerated session fairly well. During session pt c/o abdominal discomfort and decreased appetite with unintentional weight loss.      Follow Up Recommendations  Home health OT;Supervision - Intermittent(OOB/mobility)    Equipment Recommendations  3 in 1 bedside commode    Recommendations for Other Services       Precautions / Restrictions Precautions Precautions: Fall Restrictions Weight Bearing Restrictions: No      Mobility Bed Mobility Overal bed mobility: Modified Independent             General bed mobility comments: use of bed rail  Transfers Overall transfer level: Needs assistance Equipment used: None Transfers: Sit to/from  Stand Sit to Stand: Min guard         General transfer comment: min guard for safety. single extremity support or rail/arm rest to steady during transition.     Balance Overall balance assessment: Mild deficits observed, not formally tested                                         ADL either performed or assessed with clinical judgement   ADL Overall ADL's : Needs assistance/impaired Eating/Feeding: Set up;Sitting   Grooming: Wash/dry hands;Min guard;Standing   Upper Body Bathing: Set up;Sitting   Lower Body Bathing: Min guard;Sit to/from stand   Upper Body Dressing : Set up;Sitting   Lower Body Dressing: Min guard;Sit to/from stand   Toilet Transfer: Min guard;Ambulation;Comfort height toilet;Grab bars   Toileting- Clothing Manipulation and Hygiene: Min guard;Sit to/from stand   Tub/ Shower Transfer: Tub transfer;Min guard;Ambulation   Functional mobility during ADLs: Min guard General ADL Comments: Wide BOS noted while walking. intermittently seeks single extremity support during dynamic tasks in standing.      Vision Patient Visual Report: No change from baseline       Perception     Praxis      Pertinent Vitals/Pain Pain Assessment: Faces Faces Pain Scale: Hurts little more Pain Location: stomach Pain Descriptors / Indicators: Discomfort Pain Intervention(s): Monitored during session     Hand Dominance Right   Extremity/Trunk Assessment Upper Extremity Assessment Upper Extremity Assessment: Generalized weakness   Lower Extremity Assessment Lower Extremity Assessment: Defer to PT evaluation;Generalized weakness       Communication Communication Communication: No difficulties   Cognition Arousal/Alertness: Awake/alert Behavior During  Therapy: Flat affect Overall Cognitive Status: Within Functional Limits for tasks assessed                                 General Comments: answer all questions appropriately    General Comments       Exercises     Shoulder Instructions      Home Living Family/patient expects to be discharged to:: Private residence Living Arrangements: Alone Available Help at Discharge: Available PRN/intermittently;Family(sister) Type of Home: Apartment Home Access: Stairs to enter [employer] of Steps: flight Entrance Stairs-Rails: Can reach both Home Layout: One level     Bathroom Shower/Tub: Teacher, early years/pre: Standard     Home Equipment: Environmental consultant - 2 wheels;Cane - single point   Additional Comments: States the stairs to her apartment have a chair lift       Prior Functioning/Environment Level of Independence: Needs assistance  Gait / Transfers Assistance Needed: does not use AD; denies history of falls ADL's / Homemaking Assistance Needed: requires assist from sister for IADL's including cooking, medication management            OT Problem List: Impaired balance (sitting and/or standing);Decreased knowledge of use of DME or AE;Decreased knowledge of precautions;Pain;Decreased activity tolerance;Decreased strength      OT Treatment/Interventions: Self-care/ADL training;Therapeutic exercise;DME and/or AE instruction;Energy conservation;Therapeutic activities;Patient/family education;Balance training    OT Goals(Current goals can be found in the care plan section) Acute Rehab OT Goals Patient Stated Goal: less pain OT Goal Formulation: With patient Time For Goal Achievement: 06/05/19 Potential to Achieve Goals: Good ADL Goals Pt Will Perform Grooming: with modified independence;standing Pt Will Perform Lower Body Bathing: with modified independence;sit to/from stand Pt Will Perform Lower Body Dressing: with modified independence;sit to/from stand Pt Will Transfer to Toilet: with modified independence;ambulating Pt Will Perform Toileting - Clothing Manipulation and hygiene: with modified independence;sit to/from stand Pt Will  Perform Tub/Shower Transfer: Tub transfer;with supervision;with modified independence;ambulating;3 in 1  OT Frequency: Min 2X/week   Barriers to D/C:            Co-evaluation              AM-PAC OT "6 Clicks" Daily Activity     Outcome Measure Help from another person eating meals?: None Help from another person taking care of personal grooming?: None Help from another person toileting, which includes using toliet, bedpan, or urinal?: A Little Help from another person bathing (including washing, rinsing, drying)?: A Little Help from another person to put on and taking off regular upper body clothing?: None Help from another person to put on and taking off regular lower body clothing?: A Little 6 Click Score: 21   End of Session Nurse Communication: Mobility status;Other (comment)(pt c/o stomach discomfort)  Activity Tolerance: Patient tolerated treatment well Patient left: in chair;with call bell/phone within reach  OT Visit Diagnosis: Unsteadiness on feet (R26.81);Muscle weakness (generalized) (M62.81);Pain                Time: 0905-0920 OT Time Calculation (min): 15 min Charges:  OT General Charges $OT Visit: 1 Visit OT Evaluation $OT Eval Low Complexity: [place], OT Acute Rehabilitation Services Pager: [phone] Office: [phone]   Hortencia Pilar 05/22/2019, 10:24 AM

## 2019-05-22 NOTE — Progress Notes (Signed)
Physical Therapy Treatment Patient Details Name: Monique Henderson MRN: [id number] DOB: 10-Sep-1950 Today's Date: 05/22/2019    History of Present Illness Monique Henderson is a 68 y.o. 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 CT chest found left pleural effusion. S/p thoracentesis 12/18. Also with acute toxic metabolic encephalopathy; CT head negative for acute abnormality. Overnight 12/18-12/19, patient had an episode of lethargy and not responding to questions. 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    PT Comments    Pt with MRI since evaluation that revealed small L parietal infarct; however, no significant changes in mobility since evaluation.  Pt did progress gait.  Did have mild scissoring with turns, needing cues for safety.  VSS with walking.  Cont POC   Follow Up Recommendations  Home health PT;Supervision - Intermittent     Equipment Recommendations       Recommendations for Other Services       Precautions / Restrictions Precautions Precautions: Fall    Mobility  Bed Mobility Overal bed mobility: Needs Assistance Bed Mobility: Supine to Sit;Sit to Supine     Supine to sit: Supervision Sit to supine: Supervision   General bed mobility comments: use of bed rail  Transfers Overall transfer level: Needs assistance Equipment used: None Transfers: Sit to/from Stand           General transfer comment: cues for safe hand placement  Ambulation/Gait Ambulation/Gait assistance: Min guard Gait Distance (Feet): 85 Feet Assistive device: Rolling walker (2 wheeled) Gait Pattern/deviations: Step-through pattern;Decreased stride length;Narrow base of support;Scissoring     General Gait  Details: Mild scissoring, cues for keeping feet apart on turns; cue for RW placement  O2 sats 98% on RA   Stairs             Wheelchair Mobility    Modified Rankin (Stroke Patients Only)       Balance Overall balance assessment: Needs assistance Sitting-balance support: No upper extremity supported;Feet supported Sitting balance-Leahy Scale: Good     Standing balance support: Bilateral upper extremity supported;During functional activity Standing balance-Leahy Scale: Fair                              Cognition Arousal/Alertness: Awake/alert Behavior During Therapy: Flat affect Overall Cognitive Status: Within Functional Limits for tasks assessed                                        Exercises      General Comments        Pertinent Vitals/Pain Pain Assessment: No/denies pain    Home Living                      Prior Function            PT Goals (current goals can now be found in the care plan section) Progress towards PT goals: Progressing toward goals    Frequency    Min 3X/week      PT Plan Current plan remains appropriate    Co-evaluation              AM-PAC PT "6 Clicks" Mobility  Outcome Measure  Help needed turning from your back to your side while in a flat bed without using bedrails?: None Help needed moving from lying on your back to sitting on the side of a flat bed without using bedrails?: None Help needed moving to and from a bed to a chair (including a wheelchair)?: None Help needed standing up from a chair using your arms (e.g., wheelchair or bedside chair)?: None Help needed to walk in hospital room?: None Help needed climbing 3-5 steps with a railing? : A Little 6 Click Score: 23    End of Session Equipment Utilized During Treatment: Gait belt Activity Tolerance: Patient tolerated treatment well Patient left: in chair;with call bell/phone within reach;with chair alarm set Nurse  Communication: Mobility status PT Visit Diagnosis: Unsteadiness on feet (R26.81);Muscle weakness (generalized) (M62.81);Pain     Time: 1540-1551 PT Time Calculation (min) (ACUTE ONLY): 11 min  Charges:  $Gait Training: 8-22 mins                     Maggie Font, PT Acute Rehab Services Pager [phone] [facility] 05/22/2019, 3:58 PM

## 2019-05-22 NOTE — Progress Notes (Addendum)
STROKE TEAM PROGRESS NOTE   HISTORY OF PRESENT ILLNESS (per record) This is a 68yr old lady who was admitted for evaluation of worsening shortness of breath from her dialysis on 12/17.  She was noted to have a left-sided pleural effusion, which was treated with a thoracentesis by interventional radiology on 12/18, that yielded about 320 mL of serosanguineous fluid. RRT was called when pt was noted by RN with sudden changes in her level of consciousness. At times she would respond to questions and then suddenly stared off or fall asleep and then come around. Stat neuro evaluation was done and head CT obtained given her multiple cerebrovascular risk factors to rule out an emergent bleed or acute ischemic stroke. This did not show any acute findings.  She was given Ativan and fosphenytoin d/t possible seizures. However, EEG did not show evidence of seizure focus and ASD was stopped. F/u MRI did show small left parietal stroke.  INTERVAL HISTORY This pt has been followed by gen neuro for possible seizures. However, EEG was not c/w this and MRI showed small stroke, thus she is now being followed by stroke team to complete stroke work up.  I have personally reviewed history of present illness, electronic medical records and imaging films in PACS.  Patient is sitting up in bedside chair.  She has no memory of what happened but otherwise is awake alert and interactive.  OBJECTIVE Vitals:   05/22/19 0115 05/22/19 0500 05/22/19 0739 05/22/19 1224  BP: 131/70 (!) 168/88 (!) 159/82 (!) 183/90  Pulse: 83 84 82 88  Resp: (!) 21 20 18 18   Temp: 98.3 F (36.8 C)  98.5 F (36.9 C) 99.4 F (37.4 C)  TempSrc: Oral  Oral Oral  SpO2:  99% 100% 100%  Weight:  49.2 kg    Height:        CBC:  Recent Labs  Lab 05/20/19 0243 05/21/19 0430  WBC 9.8 9.7  HGB 8.6* 9.0*  HCT 28.9* 29.8*  MCV 94.1 92.0  PLT 587* 623*    Basic Metabolic Panel:  Recent Labs  Lab 05/21/19 0430 05/22/19 0520  NA 136 135  K  4.0 4.4  CL 97* 96*  CO2 28 25  GLUCOSE 109* 108*  BUN 18 34*  CREATININE 4.02* 6.11*  CALCIUM 8.3* 8.9  PHOS 3.8  --     Lipid Panel:     Component Value Date/Time   CHOL 156 02/24/2019 1244   TRIG 65 02/24/2019 1244   HDL 54 02/24/2019 1244   CHOLHDL 2.9 02/24/2019 1244   CHOLHDL 3.0 04/20/2012 1220   VLDL 12 04/20/2012 1220   LDLCALC 89 02/24/2019 1244   HgbA1c:  Lab Results  Component Value Date   HGBA1C 6.0 (H) 04/04/2019   Urine Drug Screen:     Component Value Date/Time   LABOPIA POSITIVE (A) 02/27/2013 2120   COCAINSCRNUR POSITIVE (A) 02/27/2013 2120   COCAINSCRNUR POS (A) 01/17/2009 2249   LABBENZ NONE DETECTED 02/27/2013 2120   LABBENZ NEG 01/17/2009 2249   AMPHETMU NONE DETECTED 02/27/2013 2120   THCU NONE DETECTED 02/27/2013 2120   LABBARB NONE DETECTED 02/27/2013 2120    Alcohol Level No results found for: ETH  IMAGING   MR ANGIO HEAD WO CONTRAST  Result Date: 05/22/2019 CLINICAL DATA:  Neuro deficit with stroke suspected EXAM: MRA HEAD WITHOUT CONTRAST TECHNIQUE: Angiographic images of the Circle of Willis were obtained using MRA technique without intravenous contrast. COMPARISON:  Brain MRI from yesterday FINDINGS: Symmetric carotid  arteries. Undulating appearance the distal right ICA could be artifact or from fibromuscular dysplasia. Undulation at the skull base is typically artifact. Mild-to-moderate generalized atherosclerotic irregularity of medium size vessels. No branch occlusion or proximal flow reducing stenosis. IMPRESSION: 1. Mild-to-moderate generalized atherosclerotic irregularity of medium size vessels. 2. Beaded appearance of the right more than left distal cervical ICA which could be artifact or fibromuscular dysplasia. Electronically Signed   By: Monte Fantasia M.D.   On: 05/22/2019 06:51   MR BRAIN WO CONTRAST  Result Date: 05/21/2019 CLINICAL DATA:  Encephalopathy. EXAM: MRI HEAD WITHOUT CONTRAST TECHNIQUE: Multiplanar, multiecho  pulse sequences of the brain and surrounding structures were obtained without intravenous contrast. COMPARISON:  Head CT 05/19/2019 FINDINGS: Brain: There is a 1 cm curvilinear acute infarct involving left parietal white matter. A single focus of chronic microhemorrhage is noted in the right parietal lobe. Patchy to confluent T2 hyperintensities in the cerebral white matter bilaterally are nonspecific but compatible with chronic small vessel ischemic disease, moderate to severe for age. T2 hyperintensities throughout the basal ganglia and thalami likely represent a combination of chronic small vessel ischemia including chronic lacunar infarcts as well as dilated perivascular spaces. Mild cerebral atrophy is within normal limits for age. No mass, midline shift, or extra-axial fluid collection is identified. Vascular: Major intracranial vascular flow voids are preserved. Skull and upper cervical spine: No suspicious marrow lesion. Sinuses/Orbits: Unremarkable orbits. Paranasal sinuses and mastoid air cells are clear. Other: None. IMPRESSION: 1. Small acute left parietal white matter infarct. 2. Moderate to severe chronic small vessel ischemic disease. Electronically Signed   By: Logan Bores M.D.   On: 05/21/2019 14:31   ECHOCARDIOGRAM COMPLETE BUBBLE STUDY  Result Date: 05/22/2019   ECHOCARDIOGRAM REPORT   Patient Name:   Monique Henderson Date of Exam: 05/22/2019 Medical Rec #:  [id number]          Height:       65.0 in Accession #:    [id number]         Weight:       125.0 lb Date of Birth:  [birth year]           BSA:          1.62 m Patient Age:    68 years           BP:           159/82 mmHg Patient Gender: F                  HR:           82 bpm. Exam Location:  Inpatient Procedure: 2D Echo and Saline Contrast Bubble Study Indications:    Stroke I63.9  History:        Patient has prior history of Echocardiogram examinations, most                 recent 04/04/2019. Thoracic Aortic Aneurysm Repair; Risk                  Factors:Hypertension.  Sonographer:    Mikki Santee RDCS (AE) Referring Phys: [id number] Lanice Schwab AROOR IMPRESSIONS  1. Left ventricular ejection fraction, by visual estimation, is 65 to 70%. The left ventricle has hyperdynamic function. There is mildly increased left ventricular hypertrophy.  2. Left ventricular diastolic parameters are consistent with Grade II diastolic dysfunction (pseudonormalization).  3. The left ventricle has no regional wall motion abnormalities.  4. Global right ventricle has normal systolic function.The right  ventricular size is normal. No increase in right ventricular wall thickness.  5. Left atrial size was normal.  6. Right atrial size was mildly dilated.  7. The mitral valve is normal in structure. Trivial mitral valve regurgitation. No evidence of mitral stenosis.  8. The tricuspid valve is normal in structure. Tricuspid valve regurgitation is severe.  9. The aortic valve is normal in structure. Aortic valve regurgitation is not visualized. No evidence of aortic valve sclerosis or stenosis. 10. The pulmonic valve was normal in structure. Pulmonic valve regurgitation is not visualized. 11. Moderately elevated pulmonary artery systolic pressure. 12. The tricuspid regurgitant velocity is 3.22 m/s, and with an assumed right atrial pressure of 3 mmHg, the estimated right ventricular systolic pressure is moderately elevated at 44.3 mmHg. 13. The inferior vena cava is normal in size with greater than 50% respiratory variability, suggesting right atrial pressure of 3 mmHg. 14. No intracardiac thrombi or masses were visualized. FINDINGS  Left Ventricle: Left ventricular ejection fraction, by visual estimation, is 65 to 70%. The left ventricle has hyperdynamic function. The left ventricle has no regional wall motion abnormalities. There is mildly increased left ventricular hypertrophy. Left ventricular diastolic parameters are consistent with Grade II diastolic dysfunction  (pseudonormalization). Normal left atrial pressure. Right Ventricle: The right ventricular size is normal. No increase in right ventricular wall thickness. Global RV systolic function is has normal systolic function. The tricuspid regurgitant velocity is 3.22 m/s, and with an assumed right atrial pressure  of 3 mmHg, the estimated right ventricular systolic pressure is moderately elevated at 44.3 mmHg. Left Atrium: Left atrial size was normal in size. Right Atrium: Right atrial size was mildly dilated Pericardium: There is no evidence of pericardial effusion. Mitral Valve: The mitral valve is normal in structure. Trivial mitral valve regurgitation. No evidence of mitral valve stenosis by observation. Tricuspid Valve: The tricuspid valve is normal in structure. Tricuspid valve regurgitation is severe. Aortic Valve: The aortic valve is normal in structure. Aortic valve regurgitation is not visualized. The aortic valve is structurally normal, with no evidence of sclerosis or stenosis. Pulmonic Valve: The pulmonic valve was normal in structure. Pulmonic valve regurgitation is not visualized. Pulmonic regurgitation is not visualized. Aorta: The aortic root, ascending aorta and aortic arch are all structurally normal, with no evidence of dilitation or obstruction. Venous: The inferior vena cava is normal in size with greater than 50% respiratory variability, suggesting right atrial pressure of 3 mmHg. IAS/Shunts: No atrial level shunt detected by color flow Doppler. Agitated saline contrast was given intravenously to evaluate for intracardiac shunting. Saline contrast bubble study was negative, with no evidence of any interatrial shunt. There is no evidence of a patent foramen ovale. No ventricular septal defect is seen or detected. There is no evidence of an atrial septal defect. Additional Comments: No intracardiac thrombi or masses were visualized.  LEFT VENTRICLE PLAX 2D LVIDd:         4.20 cm  Diastology LVIDs:          2.50 cm  LV e' lateral:   5.11 cm/s LV PW:         1.30 cm  LV E/e' lateral: 28.2 LV IVS:        1.00 cm  LV e' medial:    6.64 cm/s LVOT diam:     2.00 cm  LV E/e' medial:  21.7 LV SV:         56 ml LV SV Index:   34.91 LVOT Area:  3.14 cm  RIGHT VENTRICLE RV S prime:     6.57 cm/s TAPSE (M-mode): 0.8 cm LEFT ATRIUM           Index       RIGHT ATRIUM           Index LA diam:      2.50 cm 1.54 cm/m  RA Area:     13.90 cm LA Vol (A2C): 22.9 ml 14.14 ml/m RA Volume:   30.10 ml  18.58 ml/m LA Vol (A4C): 27.8 ml 17.16 ml/m  AORTIC VALVE LVOT Vmax:   126.00 cm/s LVOT Vmean:  71.500 cm/s LVOT VTI:    0.232 m  AORTA Ao Root diam: 2.70 cm MITRAL VALVE                         TRICUSPID VALVE MV Area (PHT): 3.63 cm              TR Peak grad:   41.3 mmHg MV PHT:        60.61 msec            TR Vmax:        344.00 cm/s MV Decel Time: 209 msec MV E velocity: 144.00 cm/s 103 cm/s  SHUNTS MV A velocity: 116.00 cm/s 70.3 cm/s Systemic VTI:  0.23 m MV E/A ratio:  1.24        1.5       Systemic Diam: 2.00 cm  Candee Furbish MD Electronically signed by Candee Furbish MD Signature Date/Time: 05/22/2019/11:07:03 AM    Final    VAS US CAROTID  Result Date: 05/22/2019 Carotid Arterial Duplex Study Indications:       CVA. Risk Factors:      Hypertension. Comparison Study:  no prior Performing Technologist: Abram Sander RVS  Examination Guidelines: A complete evaluation includes B-mode imaging, spectral Doppler, color Doppler, and power Doppler as needed of all accessible portions of each vessel. Bilateral testing is considered an integral part of a complete examination. Limited examinations for reoccurring indications may be performed as noted.  Right Carotid Findings: +----------+--------+--------+--------+------------------+--------+           PSV cm/sEDV cm/sStenosisPlaque DescriptionComments +----------+--------+--------+--------+------------------+--------+ CCA Prox  63      15                                          +----------+--------+--------+--------+------------------+--------+ CCA Distal48      20                                         +----------+--------+--------+--------+------------------+--------+ ICA Prox  104     42      40-59%                    tortuous +----------+--------+--------+--------+------------------+--------+ ICA Mid   90      31                                         +----------+--------+--------+--------+------------------+--------+ ICA Distal151     62                                         +----------+--------+--------+--------+------------------+--------+  ECA                       Occluded                           +----------+--------+--------+--------+------------------+--------+ +----------+--------+-------+--------+-------------------+           PSV cm/sEDV cmsDescribeArm Pressure (mmHG) +----------+--------+-------+--------+-------------------+ [id number]                                         +----------+--------+-------+--------+-------------------+ +---------+--------+--+--------+--+---------+ VertebralPSV cm/s65EDV cm/s18Antegrade +---------+--------+--+--------+--+---------+  Left Carotid Findings: +----------+--------+--------+--------+------------------+--------------+           PSV cm/sEDV cm/sStenosisPlaque DescriptionComments       +----------+--------+--------+--------+------------------+--------------+ CCA Prox  76      20                                               +----------+--------+--------+--------+------------------+--------------+ CCA Distal57      15                                               +----------+--------+--------+--------+------------------+--------------+ ICA Prox  136     43      40-59%                    tortuous       +----------+--------+--------+--------+------------------+--------------+ ICA Mid   66      25                                                +----------+--------+--------+--------+------------------+--------------+ ICA Distal98      34                                               +----------+--------+--------+--------+------------------+--------------+ ECA       42                                        near occlusion +----------+--------+--------+--------+------------------+--------------+ +---------+--------+--+--------+--+ VertebralPSV cm/s47EDV cm/s15 +---------+--------+--+--------+--+  Summary: Right Carotid: Velocities in the right ICA are consistent with a 40-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 The ECA appears occluded. Left Carotid: Velocities in the left ICA are consistent with a 40-59% stenosis. Vertebrals: Bilateral vertebral arteries demonstrate antegrade flow. *See table(s) above for measurements and observations.  Electronically signed by Antony Contras MD on 05/22/2019 at 12:50:21 PM.    Final    ECG - SR(See cardiology reading for complete details)  EEG This is an abnormal EEG secondary to mild posterior background slowing.  This finding may be seen with a diffuse gray matter disturbance that is etiologically nonspecific, but may include a dementia, among other possibilities.  No epileptiform activity is noted.   PHYSICAL EXAM Blood  pressure (!) 183/90, pulse 88, temperature 99.4 F (37.4 C), temperature source Oral, resp. rate 18, height 5\' 3"  (1.6 m), weight 49.2 kg, SpO2 100 %. Physical Exam  Constitutional: thin, frail, chronically ill AA lady Psych: Affect appropriate to situation Eyes: Normal external eye and conjunctiva. HENT: Normocephalic, no lesions, without obvious abnormality.   Musculoskeletal-no joint tenderness, deformity or swelling Cardiovascular: Normal rate and regular rhythm.  Respiratory: Effort normal, non-labored breathing Skin: WDI  Neuro Exam  Mental Status: She is awake alert and interactive Oriented to place, situation, year, month. Speech fluent without evidence of  aphasia.  Able to follow 3 step commands without difficulty. Cranial Nerves: II: Visual fields grossly normal,  III,IV, VI: ptosis not present, extra-ocular motions intact bilaterally V,VII: smile symmetric, facial light touch sensation normal bilaterally VIII: hearing normal bilaterally XII: midline tongue extension Motor: Right :  Upper extremity   5/5                                      Left:     Upper extremity   5/5             Lower extremity   5/5                                                  Lower extremity   5/5 Asterixis noted in hands when holding them up during strength testing testing L>R. Sensory: light touch intact throughout, bilaterally Gait: not tested  HOME MEDICATIONS:  Medications Prior to Admission  Medication Sig Dispense Refill  . albuterol (VENTOLIN HFA) 108 (90 Base) MCG/ACT inhaler Inhale 2 puffs into the lungs every 6 (six) hours as needed for wheezing or shortness of breath. 8 g 5  . amiodarone (PACERONE) 200 MG tablet Take 1 tablet (200 mg total) by mouth daily. For 2 weeks then stop. 14 tablet 0  . amLODipine (NORVASC) 10 MG tablet TAKE 1 TABLET (10 MG TOTAL) BY MOUTH DAILY. 90 tablet 0  . aspirin EC 81 MG tablet Take 81 mg by mouth daily.    . cloNIDine (CATAPRES - DOSED IN MG/24 HR) 0.3 mg/24hr patch Place 1 patch (0.3 mg total) onto the skin once a week. 4 patch 2  . famotidine (PEPCID) 10 MG tablet Take 1 tablet (10 mg total) by mouth at bedtime. 90 tablet 1  . hydrALAZINE (APRESOLINE) 25 MG tablet Take 1 tablet (25 mg total) by mouth every 8 (eight) hours. 90 tablet 1  . metoprolol tartrate (LOPRESSOR) 50 MG tablet Take 1 tablet (50 mg total) by mouth 2 (two) times daily. 60 tablet 1  . multivitamin (RENA-VIT) TABS tablet Take 1 tablet by mouth at bedtime. 30 tablet 1  . ondansetron (ZOFRAN) 4 MG tablet Take 1 tablet (4 mg total) by mouth every 8 (eight) hours as needed for nausea or vomiting. 20 tablet 0  . oxyCODONE (OXY IR/ROXICODONE) 5 MG  immediate release tablet Take 1 tablet (5 mg total) by mouth every 4 (four) hours as needed for severe pain. 30 tablet 0  . pregabalin (LYRICA) 75 MG capsule TAKE 1 CAPSULE (75 MG TOTAL) BY MOUTH 2 (TWO) TIMES DAILY. 60 capsule 6  . aspirin EC 325 MG tablet Take 1 tablet (325 mg total) by mouth  daily. (Patient not taking: Reported on 05/19/2019)  3  . traMADol (ULTRAM) 50 MG tablet Take 1 tablet (50 mg total) by mouth every 12 (twelve) hours as needed for moderate pain. (Patient not taking: Reported on 05/19/2019) 10 tablet 0      HOSPITAL MEDICATIONS:  . aspirin EC  325 mg Oral Daily  . Chlorhexidine Gluconate Cloth  6 each Topical Q0600  . Chlorhexidine Gluconate Cloth  6 each Topical Q0600  . doxercalciferol  1 mcg Intravenous Q T,Th,Sa-HD  . famotidine  10 mg Oral QHS  . feeding supplement  1 Container Oral BID BM  . feeding supplement (PRO-STAT SUGAR FREE 64)  30 mL Oral BID BM  . heparin  5,000 Units Subcutaneous Q8H  . hydrALAZINE  25 mg Oral Q8H  . metoprolol tartrate  50 mg Oral BID  . multivitamin  1 tablet Oral QHS    ALLERGIES Allergies  Allergen Reactions  . Shrimp [Shellfish Allergy] Shortness Of Breath  . Bactroban [Mupirocin] Other (See Comments)    "Sores in nose"  . Vicodin [Hydrocodone-Acetaminophen] Itching and Nausea And Vomiting    This is patient's home medication  . Lisinopril Cough  . Tylenol [Acetaminophen] Itching   ASSESSMENT/PLAN 68 y.o. female with past medical history significant for end-stage renal disease on dialysis TTS, breast cancer, atrial fibrillation (on ASA), hepatitis C that has been treated, hypertension, low back pain and positive cocaine use in past (not tested this admit).  Neurology was consulted for episodic altered LOC with asterixis. Speech was severely dysarthric, but this cleared. Head CT showed no acute changes. Felt to be likely toxic metabolic encephalopathy vs seizure. Loaded with dilantin. Since EEG was unremarkable and MRI  showed stroke, pt was transferred to stroke service to complete stroke wk up. Dilantin was stopped.  During this admission she was also treated with a cardizem drip for a-fib with RVR and a thoracentesis for left-sided moderate pleural effusion.     Stroke: embolic left parietal stroke, secondary to Afib  Resultant  AMS, speech changes  Code Stroke CT Head -    ASPECTS n/a  CT head - neg  MRI head- left parietal stroke  MRA head - intracranial athro/irreg throughout  CTA H&N - unable d/t ESRD  CT Perfusion- not done  Carotid Doppler - bilateral 50-69%  2D Echo - EF 65%, LVH and hyperdynamic. Grade II diastolic dysfunction. RA mild dilated. No thrombus  [employer] Virus 2  neg  LDL - pending    Component Value Date/Time   LDLCALC 89 02/24/2019 1244     HgbA1c - 6.0  UDS not done  VTE prophylaxis - heparin sq Diet  Diet Order            Diet renal with fluid restriction Fluid restriction: 1200 mL Fluid; Room service appropriate? Yes; Fluid consistency: Thin  Diet effective now               ASA prior to admission, now on ASA, but [place] when able for Afib  Patient will need to be counseled to be compliant with her antithrombotic medications  Ongoing aggressive stroke risk factor management  Therapy recommendations:  pending  Disposition:  Pending  Hypertension  Home BP meds: Amiodirone, norvasc, catapres, apresoline, lopressor  Current BP meds: Lopressor, apresoline  Stable . Permissive hypertension . Long-term BP goal normotensive  Hyperlipidemia  Home Lipid lowering medication: none   LDL pending, goal < 70  Current lipid lowering medication: pending level  Continue  statin at discharge  Diabetes  Home diabetic meds: none   Current diabetic meds: SSI   HgbA1c 6.0 , goal < 7.0 Recent Labs    05/19/19 2139 05/21/19 1116 05/21/19 1627  GLUCAP 123* 139* 136*     Other Stroke Risk Factors  Cigarette smoker; advised to stop  smoking  ETOH use, advised to drink no more than 1 drink(s) a day  Family hx stroke   Cocaine Substance Abuse   Other Active Problems  ESRD, on HD- monitoring  Afib RVR- cardizem gtt. Not on [facility] yet  Left pleural effusion- s/p [facility] day # 4  Desiree Metzger-Cihelka, ARNP-C, ANVP-BC Pager: [phone] I have personally obtained history,examined this patient, reviewed notes, independently viewed imaging studies, participated in medical decision making and plan of care.ROS completed by me personally and pertinent positives fully documented  I have made any additions or clarifications directly to the above note. Agree with note above.  Patient presented with altered mental status believed to be related to metabolic encephalopathy with MRI shows tiny punctate left parietal infarct which is possibly from atrial fibrillation and she is not on anticoagulation.  Recommend consider switching to warfarin given her renal failure.  Continue ongoing stroke work-up.  Her mental status is much improved and she has no focal deficits and so do not believe the stroke is responsible for her clinical symptoms.  Greater than 50% time during this 35-minute visit was spent on counseling and coordination of care about embolic strokes and A. fib and discussion about risk benefit of anticoagulation.  Discussed with Dr. Dessa Phi.  Antony Contras, MD Medical Director [facility] Pager: [phone] 05/22/2019 4:24 PM  To contact Stroke Continuity provider, please refer to [email or web address] After hours, contact General Neurology

## 2019-05-22 NOTE — Progress Notes (Signed)
Carotid duplex has been completed.   Preliminary results in CV Proc.   Abram Sander 05/22/2019 9:56 AM

## 2019-05-23 LAB — PROTIME-INR
INR: 1.2 (ref 0.8–1.2)
Prothrombin Time: 14.7 seconds (ref 11.4–15.2)

## 2019-05-23 LAB — BASIC METABOLIC PANEL
Anion gap: 15 (ref 5–15)
BUN: 41 mg/dL — ABNORMAL HIGH (ref 8–23)
CO2: 25 mmol/L (ref 22–32)
Calcium: 9.4 mg/dL (ref 8.9–10.3)
Chloride: 96 mmol/L — ABNORMAL LOW (ref 98–111)
Creatinine, Ser: 6.76 mg/dL — ABNORMAL HIGH (ref 0.44–1.00)
GFR calc Af Amer: 7 mL/min — ABNORMAL LOW (ref >60)
GFR calc non Af Amer: 6 mL/min — ABNORMAL LOW (ref >60)
Glucose, Bld: 119 mg/dL — ABNORMAL HIGH (ref 70–99)
Potassium: 4.5 mmol/L (ref 3.5–5.1)
Sodium: 136 mmol/L (ref 135–145)

## 2019-05-23 LAB — CBC
HCT: 30.6 % — ABNORMAL LOW (ref 36.0–46.0)
Hemoglobin: 9.5 g/dL — ABNORMAL LOW (ref 12.0–15.0)
MCH: 27.5 pg (ref 26.0–34.0)
MCHC: 31 g/dL (ref 30.0–36.0)
MCV: 88.7 fL (ref 80.0–100.0)
Platelets: 712 10*3/uL — ABNORMAL HIGH (ref 150–400)
RBC: 3.45 MIL/uL — ABNORMAL LOW (ref 3.87–5.11)
RDW: 17.5 % — ABNORMAL HIGH (ref 11.5–15.5)
WBC: 11.2 10*3/uL — ABNORMAL HIGH (ref 4.0–10.5)
nRBC: 0 % (ref 0.0–0.2)

## 2019-05-23 MED ORDER — WARFARIN SODIUM 2.5 MG PO TABS
2.5000 mg | ORAL_TABLET | Freq: Once | ORAL | Status: DC
  Filled 2019-05-23 (×2): qty 1

## 2019-05-23 MED ORDER — ASPIRIN EC 81 MG PO TBEC: 81.0000 mg | DELAYED_RELEASE_TABLET | Freq: Every day | ORAL | 0 refills | Status: DC

## 2019-05-23 MED ORDER — HYDRALAZINE HCL 20 MG/ML IJ SOLN
5.0000 mg | INTRAMUSCULAR | Status: DC | PRN
  Administered 2019-05-23 – 2019-05-24 (×3): 5 mg via INTRAVENOUS
  Filled 2019-05-23 (×3): qty 1

## 2019-05-23 MED ORDER — PROMETHAZINE HCL 25 MG/ML IJ SOLN
12.5000 mg | Freq: Four times a day (QID) | INTRAMUSCULAR | Status: DC | PRN
  Filled 2019-05-23: qty 1

## 2019-05-23 MED ORDER — DOXERCALCIFEROL 4 MCG/2ML IV SOLN
INTRAVENOUS | Status: AC
  Filled 2019-05-23: qty 2

## 2019-05-23 MED ORDER — HEPARIN SODIUM (PORCINE) 1000 UNIT/ML IJ SOLN
INTRAMUSCULAR | Status: AC
  Administered 2019-05-23: 1000 [IU]
  Filled 2019-05-23: qty 3

## 2019-05-23 MED ORDER — OXYCODONE HCL 5 MG PO TABS
ORAL_TABLET | ORAL | Status: AC
  Filled 2019-05-23: qty 1

## 2019-05-23 MED ORDER — WARFARIN SODIUM 2.5 MG PO TABS: 2.5000 mg | ORAL_TABLET | Freq: Every day | ORAL | 2 refills | Status: DC

## 2019-05-23 MED FILL — ASPIRIN LOW DOSE 81 MG TBEC: 81 | 30 days supply | Qty: 30 | Fill #0

## 2019-05-23 MED FILL — WARFARIN SODIUM 2.5 MG TAB: 2.5 | 30 days supply | Qty: 30 | Fill #0

## 2019-05-23 NOTE — Discharge Summary (Addendum)
Physician Discharge Summary  Monique Henderson VHQ:[id number] DOB: [birth year] DOA: 05/18/2019  PCP: [name], MD  Admit date: 05/18/2019 Discharge date: 05/23/2019  Admitted From: Home Disposition: Home with home health  Recommendations for Outpatient Follow-up:  1. Follow up with PCP in 1 week 2. Follow up with neurology in 4 to 6 weeks 3. INR checks at HD  4. Follow up with Dr. 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 She was referred from the outpatient hemodialysis center to the emergency room because of complaints of shortness of breath. She said she has been feeling short of breath and having chest pain since she had her heart surgery on 04/04/2019. Chest pain is usually across her chest and this is nothing new since her surgery. However, shortness of breath has progressively worsened. Shortness of breath is worsened by mild exertion while talking.  CT chest in the emergency department was negative for PE, but did find an increased left pleural effusion.  Cardiothoracic surgery team recommended patient to undergo thoracentesis.  Patient underwent thoracentesis 12/18 with 320 mL serosanguineous fluid removed.  Overnight 12/18-12/19, patient had an episode of lethargy and not responding to questions.  Due to concern for sudden change of her level of consciousness, neurology was consulted and patient underwent CT head.  This was thought to be secondary to toxic metabolic encephalopathy but could not rule out seizures.  Patient was given benzodiazepine as well as  fosphenytoin.  MRI was positive for small acute left parietal infarction.  Due to history of atrial fibrillation, patient was started on Coumadin.  She had no further episodes, her episode overnight on 12/18 was thought to be metabolic encephalopathy and not due to seizure.  She had no further episodes.  Discharge Diagnoses:  Active Problems:   Essential hypertension   Pleural effusion on left   Atrial fibrillation with RVR (HCC)   Malnutrition of moderate degree   Cerebral thrombosis with cerebral infarction   Moderate left pleural effusion -Status post thoracentesis 12/18 with 320 mL serosanguineous fluid removed.  Pleural studies suggestive of exudative fluid.  Culture pending. -Repeat chest x-ray 12/18 without postprocedural pneumothorax -Repeat chest x-ray 12/18 small left effusion with left base atelectasis  Acute left parietal CVA  -MRI: Small acute left parietal white matter infarct. Moderate to severe chronic small vessel ischemic disease. -MRA: Mild-to-moderate generalized atherosclerotic irregularity of medium size vessels. -Carotid 40 to 59% stenosis bilaterally -Echo EF 65 to 70%, no intracardiac thrombi or masses visualized -PT OT eval rec home health -Neurology following; follow-up as an outpatient -Started Coumadin due to history of atrial fibrillation -LDL 56  Acute toxic metabolic encephalopathy -CT head without acute intracranial abnormality -Ammonia 22 -EEG abnormal secondary tomildposterior background slowing. This finding may be seen with a diffuse gray matter disturbance that is etiologically nonspecific, but may include a dementia, among other possibilities. No epileptiform activity is noted. 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 Orvan Seen 04/04/2019  Paroxysmal atrial fibrillation with RVR -Patient did receive IV Cardizem drip at the time of admission -She had recently finished  amiodarone therapy as an outpatient and now is no longer taking amiodarone -  Back to normal sinus rhythm.  Continue metoprolol  ESRD on dialysis -Nephrology following   Hypertension -Hydralazine, metoprolol  Elevated troponin -Demand ischemia, flat trend without concern for ACS   Discharge Instructions  Discharge Instructions    Ambulatory referral to Neurology   Complete by: As directed    An appointment is requested in approximately: 4 weeks   Call MD for:  difficulty breathing, headache or visual disturbances   Complete by: As directed    Call MD for:  extreme fatigue   Complete by: As directed    Call MD for:  persistant dizziness or light-headedness   Complete by: As directed    Call MD for:  persistant nausea and vomiting   Complete by: As directed    Call MD for:  severe uncontrolled pain   Complete by: As directed    Call MD for:  temperature >100.4   Complete by: As directed    Discharge instructions   Complete by: As directed    You were cared for by a hospitalist during your hospital stay. If you have any questions about your discharge medications or the care you received while you were in the hospital after you are discharged, you can call the unit and ask to speak with the hospitalist on call if the hospitalist that took care of you is not available. Once you are discharged, your primary care physician will handle any further medical issues. Please note that NO REFILLS for any discharge medications will be authorized once you are discharged, as it is imperative that you return to your primary care physician (or establish a relationship with a primary care physician if you do not have one) for your aftercare needs so that they can reassess your need for medications and monitor your lab values.   Increase activity slowly   Complete by: As directed      Allergies as of 05/23/2019      Reactions   Shrimp [shellfish Allergy] Shortness Of Breath   Bactroban  [mupirocin] Other (See Comments)   "Sores in nose"   Vicodin [hydrocodone-acetaminophen] Itching, Nausea And Vomiting   This is patient's home medication   Lisinopril Cough   Tylenol [acetaminophen] Itching      Medication List    STOP taking these medications   amiodarone 200 MG tablet Commonly known as: PACERONE   oxyCODONE 5 MG immediate release tablet Commonly known as: Oxy IR/ROXICODONE   pregabalin 75 MG capsule Commonly known as: LYRICA   traMADol 50 MG tablet Commonly known as: ULTRAM     TAKE these medications   albuterol 108 (90 Base) MCG/ACT inhaler Commonly known as: VENTOLIN HFA Inhale 2 puffs into the lungs every 6 (six) hours as needed for wheezing or shortness of breath.   amLODipine 10 MG tablet Commonly known as: NORVASC TAKE 1 TABLET (10 MG TOTAL) BY MOUTH DAILY.   aspirin EC 81 MG tablet Take 1 tablet (81 mg total) by mouth daily. Until INR > 2. Then stop and continue coumadin only. What changed:   additional instructions  Another medication with the same name was removed. Continue taking this medication, and follow the directions you see here.   cloNIDine 0.3 mg/24hr patch Commonly known as: CATAPRES - Dosed in mg/24 hr Place 1 patch (0.3 mg total) onto the skin once a week.   famotidine 10 MG tablet Commonly known as: PEPCID Take 1 tablet (10 mg total) by mouth at bedtime.   hydrALAZINE 25 MG tablet Commonly known  as: APRESOLINE Take 1 tablet (25 mg total) by mouth every 8 (eight) hours.   metoprolol tartrate 50 MG tablet Commonly known as: LOPRESSOR Take 1 tablet (50 mg total) by mouth 2 (two) times daily.   multivitamin Tabs tablet Take 1 tablet by mouth at bedtime.   ondansetron 4 MG tablet Commonly known as: Zofran Take 1 tablet (4 mg total) by mouth every 8 (eight) hours as needed for nausea or vomiting.   warfarin 2.5 MG tablet Commonly known as: Coumadin Take 1 tablet (2.5 mg total) by mouth daily.      Follow-up  Information    [facility] Follow up.   Specialty: Home Health Services Why: HHPT Contact information: [address] [address] [phone]        [name], MD. Schedule an appointment as soon as possible for a visit in 1 week(s).   Specialty: Family Medicine Contact information: [address]. [place] [address] [phone]        [facility]. Schedule an appointment as soon as possible for a visit in 4 week(s).   Contact information: [address]     [place] [address] [phone]       Wonda Olds, MD. Go on 06/05/2019.   Specialty: Cardiothoracic Surgery Contact information: [address] [place] [address] [phone]          Allergies  Allergen Reactions  . Shrimp [Shellfish Allergy] Shortness Of Breath  . Bactroban [Mupirocin] Other (See Comments)    "Sores in nose"  . Vicodin [Hydrocodone-Acetaminophen] Itching and Nausea And Vomiting    This is patient's home medication  . Lisinopril Cough  . Tylenol [Acetaminophen] Itching    Consultations:  Neurology  Nephrology   Procedures/Studies: EEG  Result Date: 05/20/2019 [name], MD     05/20/2019 12:08 PM ELECTROENCEPHALOGRAM REPORT Patient: SHIA DELAINE       Room #: 0X32T EEG No. ID: [id number] Age: 68 y.o.        Sex: female Referring Physician: Maylene Roes Report Date:  05/20/2019       Interpreting Physician: [name] History: ANALLELI GIERKE is an 68 y.o. female with altered mental status Medications: ASA, Apresoline, Lopressor Conditions of Recording:  This is a 21 channel routine scalp EEG performed with bipolar and monopolar montages arranged in accordance to the international 10/20 system of electrode placement. One channel was dedicated to EKG recording. The patient is in the awake and drowsy states. Description:  The waking background activity consists of a  low voltage, symmetrical, fairly well organized, 7 Hz theta activity, seen from the parieto-occipital and posterior temporal regions.  Low voltage fast activity, poorly organized, is seen anteriorly and is at times superimposed on more posterior regions.  A mixture of theta and alpha rhythms are seen from the central and temporal regions. The patient drowses with slowing to irregular, low voltage theta and beta activity.  Stage II sleep is not obtained. No epileptiform activity is noted.  Hyperventilation and intermittent photic stimulation were not performed. IMPRESSION: This is an abnormal EEG secondary to mild posterior background slowing.  This finding may be seen with a diffuse gray matter disturbance that is etiologically nonspecific, but may include a dementia, among other possibilities.  No epileptiform activity is noted.  [name], MD Neurology [phone] 05/20/2019, 12:04 PM   DG Chest 1 View  Result Date: 05/19/2019 CLINICAL DATA:  Post left-sided thoracentesis.  EXAM: CHEST  1 VIEW COMPARISON:  05/18/2019 FINDINGS: Sternotomy wires and right IJ dialysis catheter unchanged. Lungs are adequately inflated with moderate interval improvement in left-sided pleural effusion with small residual pleural effusions/atelectasis present. No left-sided pneumothorax. Right lung is clear. Cardiomediastinal silhouette and remainder of the exam is unchanged. IMPRESSION: Significant improvement in left pleural effusion post thoracentesis with small residual pleural effusion/basilar atelectasis. No pneumothorax. Electronically Signed   By: Marin Olp M.D.   On: 05/19/2019 09:00   DG Chest 2 View  Result Date: 05/08/2019 CLINICAL DATA:  Aortic aneurysm repair 04/04/2019 shortness of breath and chest pain. EXAM: CHEST - 2 VIEW COMPARISON:  One-view chest x-ray 04/20/2019 FINDINGS: Heart is enlarged. Median sternotomy is noted. Right IJ dialysis catheter is in place. Aeration of both lungs is markedly  improved. Moderate left-sided pleural effusion has improved. Bibasilar airspace opacities likely reflect atelectasis. IMPRESSION: 1. Marked improvement in aeration of both lungs. 2. Moderate left pleural effusion has improved. 3. Bibasilar airspace disease likely reflects atelectasis. Electronically Signed   By: San Morelle M.D.   On: 05/08/2019 10:51   CT HEAD WO CONTRAST  Result Date: 05/19/2019 CLINICAL DATA:  Altered mental status. EXAM: CT HEAD WITHOUT CONTRAST TECHNIQUE: Contiguous axial images were obtained from the base of the skull through the vertex without intravenous contrast. COMPARISON:  None. 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 No acute intracranial abnormality. Specifically, no hemorrhage, hydrocephalus, mass lesion, acute infarction, or significant intracranial injury. Vascular: No hyperdense vessel or unexpected calcification. Skull: No acute calvarial abnormality. Sinuses/Orbits: Visualized paranasal sinuses and mastoids clear. Orbital soft tissues unremarkable. 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 No acute intracranial abnormality. Electronically Signed   By: Rolm Baptise M.D.   On: 05/19/2019 22:41   CT Chest Wo Contrast  Result Date: 05/13/2019 CLINICAL DATA:  Chest pain shortness of breath. Chest pain with breathing. History of an thoracic aortic aneurysm repair. EXAM: CT CHEST WITHOUT CONTRAST TECHNIQUE: Multidetector CT imaging of the chest was performed following the standard protocol without IV contrast. COMPARISON:  04/03/2019 FINDINGS: Cardiovascular: Heart is for line enlarged. Small pericardial effusion. Changes from the pair of the ascending thoracic aortic aneurysm. Atherosclerotic calcifications are noted along the aortic arch and descending thoracic aorta, stable. Mediastinum/Nodes: Thyroid is unremarkable. No neck base, axillary, mediastinal or hilar masses or enlarged  lymph nodes. Trachea and esophagus are unremarkable. Right internal jugular central venous catheter has its tip at the caval atrial junction. Lungs/Pleura: Small left pleural effusion. This is new since the prior CT. There is dependent opacity in the left lower lobe consistent with atelectasis. A component of pneumonia is not excluded. Are additional linear opacities noted in the right lower lobe and right middle and upper lobes consistent atelectasis, scarring or a combination. There is no evidence of pulmonary edema. No right pleural effusion. No pneumothorax. Upper Abdomen: No acute abnormality. Musculoskeletal: No fracture or acute finding. No osteoblastic or osteolytic lesions. IMPRESSION: 1. Small left pleural effusion associated with left lower lobe dependent opacity, which is most likely atelectasis. Consider a component of pneumonia if there are consistent clinical findings. 2. Small pericardial effusion similar to the prior CT. 3. Stable changes from repair of an ascending thoracic aortic aneurysm. 4. Aortic atherosclerosis. Aortic Atherosclerosis (ICD10-I70.0). Electronically Signed   By: Lajean Manes M.D.   On: 05/13/2019 12:04   CT Angio Chest PE W and/or Wo Contrast  Result Date: 05/18/2019 CLINICAL DATA:  Shortness  of breath. EXAM: CT ANGIOGRAPHY CHEST WITH CONTRAST TECHNIQUE: Multidetector CT imaging of the chest was performed using the standard protocol during bolus administration of intravenous contrast. Multiplanar CT image reconstructions and MIPs were obtained to evaluate the vascular anatomy. CONTRAST:  113mL OMNIPAQUE IOHEXOL 350 MG/ML SOLN COMPARISON:  CT chest dated May 13, 2019. FINDINGS: Cardiovascular: Satisfactory opacification of the pulmonary arteries to the segmental level. No evidence of pulmonary embolism. Unchanged mild cardiomegaly and small pericardial effusion. Prior ascending thoracic aortic graft repair. No dissection or aneurysm. Thoracic aortic and branch vessel  atherosclerotic vascular disease. Tunneled right internal jugular dialysis catheter with tip at the cavoatrial junction. Mediastinum/Nodes: No enlarged mediastinal, hilar, or axillary lymph nodes. Thyroid gland, trachea, and esophagus demonstrate no significant findings. Lungs/Pleura: Enlarging now moderate left pleural effusion with near complete collapse of the left lower lobe. New subsegmental atelectasis in the lingula. No focal consolidation, pleural effusion, or pneumothorax. Unchanged scarring at the right lung base. No suspicious pulmonary nodule. Upper Abdomen: No acute abnormality. Musculoskeletal: No chest wall abnormality. No acute or significant osseous findings. Review of the MIP images confirms the above findings. IMPRESSION: 1.  No evidence of pulmonary embolism. 2. Enlarging now moderate left pleural effusion with near complete collapse of the left lower lobe. 3. Unchanged small pericardial effusion. 4.  Aortic atherosclerosis (ICD10-I70.0). Electronically Signed   By: Titus Dubin M.D.   On: 05/18/2019 16:46   MR ANGIO HEAD WO CONTRAST  Result Date: 05/22/2019 CLINICAL DATA:  Neuro deficit with stroke suspected EXAM: MRA HEAD WITHOUT CONTRAST TECHNIQUE: Angiographic images of the Circle of Willis were obtained using MRA technique without intravenous contrast. COMPARISON:  Brain MRI from yesterday FINDINGS: Symmetric carotid arteries. Undulating appearance the distal right ICA could be artifact or from fibromuscular dysplasia. Undulation at the skull base is typically artifact. Mild-to-moderate generalized atherosclerotic irregularity of medium size vessels. No branch occlusion or proximal flow reducing stenosis. IMPRESSION: 1. Mild-to-moderate generalized atherosclerotic irregularity of medium size vessels. 2. Beaded appearance of the right more than left distal cervical ICA which could be artifact or fibromuscular dysplasia. Electronically Signed   By: Monte Fantasia M.D.   On: 05/22/2019  06:51   MR BRAIN WO CONTRAST  Result Date: 05/21/2019 CLINICAL DATA:  Encephalopathy. EXAM: MRI HEAD WITHOUT CONTRAST TECHNIQUE: Multiplanar, multiecho pulse sequences of the brain and surrounding structures were obtained without intravenous contrast. COMPARISON:  Head CT 05/19/2019 FINDINGS: Brain: There is a 1 cm curvilinear acute infarct involving left parietal white matter. A single focus of chronic microhemorrhage is noted in the right parietal lobe. Patchy to confluent T2 hyperintensities in the cerebral white matter bilaterally are nonspecific but compatible with chronic small vessel ischemic disease, moderate to severe for age. T2 hyperintensities throughout the basal ganglia and thalami likely represent a combination of chronic small vessel ischemia including chronic lacunar infarcts as well as dilated perivascular spaces. Mild cerebral atrophy is within normal limits for age. No mass, midline shift, or extra-axial fluid collection is identified. Vascular: Major intracranial vascular flow voids are preserved. Skull and upper cervical spine: No suspicious marrow lesion. Sinuses/Orbits: Unremarkable orbits. Paranasal sinuses and mastoid air cells are clear. Other: None. IMPRESSION: 1. Small acute left parietal white matter infarct. 2. Moderate to severe chronic small vessel ischemic disease. Electronically Signed   By: Logan Bores M.D.   On: 05/21/2019 14:31   DG Chest Port 1 View  Result Date: 05/19/2019 CLINICAL DATA:  Shortness of breath EXAM: PORTABLE CHEST 1 VIEW COMPARISON:  05/19/2019  FINDINGS: Prior CABG. Cardiomegaly. Small left pleural effusion with left base atelectasis. Right lung clear. No acute bony abnormality. IMPRESSION: Cardiomegaly.  Small left effusion with left base atelectasis. Electronically Signed   By: Rolm Baptise M.D.   On: 05/19/2019 23:11   DG Chest Port 1 View  Result Date: 05/18/2019 CLINICAL DATA:  Chest pain EXAM: PORTABLE CHEST 1 VIEW COMPARISON:  Five days  ago FINDINGS: Mild increase in left pleural effusion obscuring the left lower lobe which was atelectatic on the recent chest CT. New mild right base atelectasis. Postoperative heart. The right lung is clear. Dialysis catheter on the right with tip at the upper cavoatrial junction. IMPRESSION: Progression of left pleural effusion and atelectasis seen on recent chest CT. Electronically Signed   By: Monte Fantasia M.D.   On: 05/18/2019 11:49   XR Chest Portable  Result Date: 05/13/2019 CLINICAL DATA:  [facility] by [facility] from [facility]. Per EMS pt was about 1.5/4hr treatment when pt began to complain of sharp CP when she was breathing. EKG unremarkable. EXAM: PORTABLE CHEST 1 VIEW COMPARISON:  Chest radiograph 05/08/2019 FINDINGS: Stable cardiomediastinal contours with enlarged heart size. Status post median sternotomy. The left pleural effusion is decreased from prior. Consolidation at the left lung base could reflect atelectasis, infiltrate not excluded. The right lung is clear. No pneumothorax. Stable appearance of a right central venous catheter. No acute finding in the visualized skeleton. IMPRESSION: 1. Consolidative opacity at the left lung base could reflect atelectasis, infiltrate, or a combination. 2. No definite pleural effusion. Electronically Signed   By: Audie Pinto M.D.   On: 05/13/2019 10:48   ECHOCARDIOGRAM COMPLETE BUBBLE STUDY  Result Date: 05/22/2019   ECHOCARDIOGRAM REPORT   Patient Name:   MANIAH NADING Date of Exam: 05/22/2019 Medical Rec #:  [id number]          Height:       65.0 in Accession #:    [id number]         Weight:       125.0 lb Date of Birth:  06/19/1950           BSA:          1.62 m Patient Age:    40 years           BP:           159/82 mmHg Patient Gender: F                  HR:           82 bpm. Exam Location:  Inpatient Procedure: 2D Echo and Saline Contrast Bubble Study Indications:    Stroke I63.9  History:        Patient has prior history of Echocardiogram  examinations, most                 recent 04/04/2019. Thoracic Aortic Aneurysm Repair; Risk                 Factors:Hypertension.  Sonographer:    Mikki Santee RDCS (AE) Referring Phys: [id number] Lanice Schwab AROOR IMPRESSIONS  1. Left ventricular ejection fraction, by visual estimation, is 65 to 70%. The left ventricle has hyperdynamic function. There is mildly increased left ventricular hypertrophy.  2. Left ventricular diastolic parameters are consistent with Grade II diastolic dysfunction (pseudonormalization).  3. The left ventricle has no regional wall motion abnormalities.  4. Global right ventricle has normal systolic function.The right ventricular size is normal. No  increase in right ventricular wall thickness.  5. Left atrial size was normal.  6. Right atrial size was mildly dilated.  7. The mitral valve is normal in structure. Trivial mitral valve regurgitation. No evidence of mitral stenosis.  8. The tricuspid valve is normal in structure. Tricuspid valve regurgitation is severe.  9. The aortic valve is normal in structure. Aortic valve regurgitation is not visualized. No evidence of aortic valve sclerosis or stenosis. 10. The pulmonic valve was normal in structure. Pulmonic valve regurgitation is not visualized. 11. Moderately elevated pulmonary artery systolic pressure. 12. The tricuspid regurgitant velocity is 3.22 m/s, and with an assumed right atrial pressure of 3 mmHg, the estimated right ventricular systolic pressure is moderately elevated at 44.3 mmHg. 13. The inferior vena cava is normal in size with greater than 50% respiratory variability, suggesting right atrial pressure of 3 mmHg. 14. No intracardiac thrombi or masses were visualized. FINDINGS  Left Ventricle: Left ventricular ejection fraction, by visual estimation, is 65 to 70%. The left ventricle has hyperdynamic function. The left ventricle has no regional wall motion abnormalities. There is mildly increased left ventricular hypertrophy.  Left ventricular diastolic parameters are consistent with Grade II diastolic dysfunction (pseudonormalization). Normal left atrial pressure. Right Ventricle: The right ventricular size is normal. No increase in right ventricular wall thickness. Global RV systolic function is has normal systolic function. The tricuspid regurgitant velocity is 3.22 m/s, and with an assumed right atrial pressure  of 3 mmHg, the estimated right ventricular systolic pressure is moderately elevated at 44.3 mmHg. Left Atrium: Left atrial size was normal in size. Right Atrium: Right atrial size was mildly dilated Pericardium: There is no evidence of pericardial effusion. Mitral Valve: The mitral valve is normal in structure. Trivial mitral valve regurgitation. No evidence of mitral valve stenosis by observation. Tricuspid Valve: The tricuspid valve is normal in structure. Tricuspid valve regurgitation is severe. Aortic Valve: The aortic valve is normal in structure. Aortic valve regurgitation is not visualized. The aortic valve is structurally normal, with no evidence of sclerosis or stenosis. Pulmonic Valve: The pulmonic valve was normal in structure. Pulmonic valve regurgitation is not visualized. Pulmonic regurgitation is not visualized. Aorta: The aortic root, ascending aorta and aortic arch are all structurally normal, with no evidence of dilitation or obstruction. Venous: The inferior vena cava is normal in size with greater than 50% respiratory variability, suggesting right atrial pressure of 3 mmHg. IAS/Shunts: No atrial level shunt detected by color flow Doppler. Agitated saline contrast was given intravenously to evaluate for intracardiac shunting. Saline contrast bubble study was negative, with no evidence of any interatrial shunt. There is no evidence of a patent foramen ovale. No ventricular septal defect is seen or detected. There is no evidence of an atrial septal defect. Additional Comments: No intracardiac thrombi or masses  were visualized.  LEFT VENTRICLE PLAX 2D LVIDd:         4.20 cm  Diastology LVIDs:         2.50 cm  LV e' lateral:   5.11 cm/s LV PW:         1.30 cm  LV E/e' lateral: 28.2 LV IVS:        1.00 cm  LV e' medial:    6.64 cm/s LVOT diam:     2.00 cm  LV E/e' medial:  21.7 LV SV:         56 ml LV SV Index:   34.91 LVOT Area:     3.14 cm  RIGHT VENTRICLE RV S prime:     6.57 cm/s TAPSE (M-mode): 0.8 cm LEFT ATRIUM           Index       RIGHT ATRIUM           Index LA diam:      2.50 cm 1.54 cm/m  RA Area:     13.90 cm LA Vol (A2C): 22.9 ml 14.14 ml/m RA Volume:   30.10 ml  18.58 ml/m LA Vol (A4C): 27.8 ml 17.16 ml/m  AORTIC VALVE LVOT Vmax:   126.00 cm/s LVOT Vmean:  71.500 cm/s LVOT VTI:    0.232 m  AORTA Ao Root diam: 2.70 cm MITRAL VALVE                         TRICUSPID VALVE MV Area (PHT): 3.63 cm              TR Peak grad:   41.3 mmHg MV PHT:        60.61 msec            TR Vmax:        344.00 cm/s MV Decel Time: 209 msec MV E velocity: 144.00 cm/s 103 cm/s  SHUNTS MV A velocity: 116.00 cm/s 70.3 cm/s Systemic VTI:  0.23 m MV E/A ratio:  1.24        1.5       Systemic Diam: 2.00 cm  Candee Furbish MD Electronically signed by Candee Furbish MD Signature Date/Time: 05/22/2019/11:07:03 AM    Final    VAS US CAROTID  Result Date: 05/22/2019 Carotid Arterial Duplex Study Indications:       CVA. Risk Factors:      Hypertension. Comparison Study:  no prior Performing Technologist: Abram Sander RVS  Examination Guidelines: A complete evaluation includes B-mode imaging, spectral Doppler, color Doppler, and power Doppler as needed of all accessible portions of each vessel. Bilateral testing is considered an integral part of a complete examination. Limited examinations for reoccurring indications may be performed as noted.  Right Carotid Findings: +----------+--------+--------+--------+------------------+--------+           PSV cm/sEDV cm/sStenosisPlaque DescriptionComments  +----------+--------+--------+--------+------------------+--------+ CCA Prox  63      15                                         +----------+--------+--------+--------+------------------+--------+ CCA Distal48      20                                         +----------+--------+--------+--------+------------------+--------+ ICA Prox  104     42      40-59%                    tortuous +----------+--------+--------+--------+------------------+--------+ ICA Mid   90      31                                         +----------+--------+--------+--------+------------------+--------+ ICA Distal151     62                                         +----------+--------+--------+--------+------------------+--------+  ECA                       Occluded                           +----------+--------+--------+--------+------------------+--------+ +----------+--------+-------+--------+-------------------+           PSV cm/sEDV cmsDescribeArm Pressure (mmHG) +----------+--------+-------+--------+-------------------+ [id number]                                         +----------+--------+-------+--------+-------------------+ +---------+--------+--+--------+--+---------+ VertebralPSV cm/s65EDV cm/s18Antegrade +---------+--------+--+--------+--+---------+  Left Carotid Findings: +----------+--------+--------+--------+------------------+--------------+           PSV cm/sEDV cm/sStenosisPlaque DescriptionComments       +----------+--------+--------+--------+------------------+--------------+ CCA Prox  76      20                                               +----------+--------+--------+--------+------------------+--------------+ CCA Distal57      15                                               +----------+--------+--------+--------+------------------+--------------+ ICA Prox  136     43      40-59%                    tortuous        +----------+--------+--------+--------+------------------+--------------+ ICA Mid   66      25                                               +----------+--------+--------+--------+------------------+--------------+ ICA Distal98      34                                               +----------+--------+--------+--------+------------------+--------------+ ECA       42                                        near occlusion +----------+--------+--------+--------+------------------+--------------+ +---------+--------+--+--------+--+ VertebralPSV cm/s47EDV cm/s15 +---------+--------+--+--------+--+  Summary: Right Carotid: Velocities in the right ICA are consistent with a 40-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 The ECA appears occluded. Left Carotid: Velocities in the left ICA are consistent with a 40-59% stenosis. Vertebrals: Bilateral vertebral arteries demonstrate antegrade flow. *See table(s) above for measurements and observations.  Electronically signed by Antony Contras MD on 05/22/2019 at 12:50:21 PM.    Final    IR THORACENTESIS ASP PLEURAL SPACE W/IMG GUIDE  Result Date: 05/19/2019 INDICATION: Shortness of breath. Left-sided pleural effusion. Request for diagnostic therapeutic thoracentesis. EXAM: ULTRASOUND GUIDED LEFT THORACENTESIS MEDICATIONS: None. COMPLICATIONS: None immediate. PROCEDURE: An ultrasound guided thoracentesis was thoroughly discussed with the patient and questions answered. The benefits, risks, alternatives and complications were also discussed.  The patient understands and wishes to proceed with the procedure. Written consent was obtained. Ultrasound was performed to localize and mark an adequate pocket of fluid in the left chest. The area was then prepped and draped in the normal sterile fashion. 1% Lidocaine was used for local anesthesia. Under ultrasound guidance a 6 Fr Safe-T-Centesis catheter was introduced. Thoracentesis was performed. The catheter was removed and a  dressing applied. FINDINGS: A total of approximately 320 mL of serosanguineous fluid was removed. Samples were sent to the laboratory as requested by the clinical team. IMPRESSION: Successful ultrasound guided left thoracentesis yielding 320 mL of pleural fluid. Read by: Ascencion Dike PA-C Electronically Signed   By: Aletta Edouard M.D.   On: 05/19/2019 09:13       Discharge Exam: Vitals:   05/23/19 0908 05/23/19 0930  BP: (!) 180/96 (!) 184/98  Pulse: 87 87  Resp: 18 18  Temp:    SpO2:  100%     General: Pt is alert, awake, not in acute distress Cardiovascular: RRR, S1/S2 +, no edema Respiratory: CTA bilaterally, no wheezing, no rhonchi, no respiratory distress, no conversational dyspnea  Abdominal: Soft, NT, ND, bowel sounds + Extremities: no edema, no cyanosis Psych: Normal mood and affect, stable judgement and insight     The results of significant diagnostics from this hospitalization (including imaging, microbiology, ancillary and laboratory) are listed below for reference.     Microbiology: Recent Results (from the past 240 hour(s))  SARS CORONAVIRUS 2 (TAT 6-24 HRS) Nasopharyngeal Nasopharyngeal Swab     Status: None   Collection Time: 05/18/19 11:56 AM   Specimen: Nasopharyngeal Swab  Result Value Ref Range Status   SARS Coronavirus 2 NEGATIVE NEGATIVE Final    Comment: (NOTE) SARS-CoV-2 target nucleic acids are NOT DETECTED. The SARS-CoV-2 RNA is generally detectable in upper and lower respiratory specimens during the acute phase of infection. Negative results do not preclude SARS-CoV-2 infection, do not rule out co-infections with other pathogens, and should not be used as the sole basis for treatment or other patient management decisions. Negative results must be combined with clinical observations, patient history, and epidemiological information. The expected result is Negative. Fact Sheet for Patients: SugarRoll.be Fact Sheet  for Healthcare Providers: https://www.woods-mathews.com/ This test is not yet approved or cleared by the [place] FDA and  has been authorized for detection and/or diagnosis of SARS-CoV-2 by FDA under an Emergency Use Authorization (EUA). This EUA will remain  in effect (meaning this test can be used) for the duration of the COVID-19 declaration under Section 56 4(b)(1) of the Act, 21 U.S.C. section 360bbb-3(b)(1), unless the authorization is terminated or revoked sooner. Performed at [facility], [place] [address], [place], [place] [address]   Body fluid culture     Status: None   Collection Time: 05/19/19  9:33 AM   Specimen: Lung, Left; Pleural Fluid  Result Value Ref Range Status   Specimen Description PLEURAL LEFT  Final   Special Requests NONE  Final   Gram Stain   Final    RARE WBC PRESENT, PREDOMINANTLY MONONUCLEAR NO ORGANISMS SEEN    Culture   Final    NO GROWTH 3 DAYS Performed at [facility], [place] [address] [place], [place] [address]    Report Status 05/22/2019 FINAL  Final  MRSA PCR Screening     Status: None   Collection Time: 05/20/19  6:39 AM   Specimen: Nasal Mucosa; Nasopharyngeal  Result Value Ref Range Status  MRSA by PCR NEGATIVE NEGATIVE Final    Comment:        The GeneXpert MRSA Assay (FDA approved for NASAL specimens only), is one component of a comprehensive MRSA colonization surveillance program. It is not intended to diagnose MRSA infection nor to guide or monitor treatment for MRSA infections. Performed at [facility], [place] [address], [place], [place] [address]      Labs: BNP (last 3 results) No results for input(s): BNP in the last 8760 hours. Basic Metabolic Panel: Recent Labs  Lab 05/19/19 0423 05/20/19 0243 05/21/19 0430 05/22/19 0520 05/23/19 0449  NA 133* 132* 136 135 136  K 4.9 4.5 4.0 4.4 4.5  CL 97* 96* 97* 96* 96*  CO2 20* 24 28 25 25   GLUCOSE 92 166* 109* 108* 119*  BUN 15 21 18   34* 41*  CREATININE 3.95* 6.53* 4.02* 6.11* 6.76*  CALCIUM 8.8* 8.8* 8.3* 8.9 9.4  PHOS  --   --  3.8  --   --    Liver Function Tests: Recent Labs  Lab 05/18/19 1152 05/20/19 0243  AST 14* 11*  ALT 8 8  ALKPHOS 73 66  BILITOT 1.0 0.3  PROT 7.8 6.2*  ALBUMIN 2.6* 2.2*   No results for input(s): LIPASE, AMYLASE in the last 168 hours. Recent Labs  Lab 05/20/19 0243  AMMONIA 22   CBC: Recent Labs  Lab 05/18/19 1152 05/19/19 0423 05/20/19 0243 05/21/19 0430 05/23/19 0449  WBC 11.2* 10.4 9.8 9.7 11.2*  HGB 10.8* 9.5* 8.6* 9.0* 9.5*  HCT 35.1* 30.9* 28.9* 29.8* 30.6*  MCV 91.6 91.7 94.1 92.0 88.7  PLT 683* 629* 587* 623* 712*   Cardiac Enzymes: No results for input(s): CKTOTAL, CKMB, CKMBINDEX, TROPONINI in the last 168 hours. BNP: Invalid input(s): POCBNP CBG: Recent Labs  Lab 05/19/19 2139 05/21/19 1116 05/21/19 1627  GLUCAP 123* 139* 136*   D-Dimer No results for input(s): DDIMER in the last 72 hours. Hgb A1c No results for input(s): HGBA1C in the last 72 hours. Lipid Profile Recent Labs    05/22/19 1700  CHOL 124  HDL 42  LDLCALC 56  TRIG 132  CHOLHDL 3.0   Thyroid function studies No results for input(s): TSH, T4TOTAL, T3FREE, THYROIDAB in the last 72 hours.  Invalid input(s): FREET3 Anemia work up No results for input(s): VITAMINB12, FOLATE, FERRITIN, TIBC, IRON, RETICCTPCT in the last 72 hours. Urinalysis    Component Value Date/Time   COLORURINE AMBER (A) 04/16/2019 0921   APPEARANCEUR CLOUDY (A) 04/16/2019 0921   LABSPEC 1.020 04/16/2019 0921   PHURINE 6.0 04/16/2019 0921   GLUCOSEU NEGATIVE 04/16/2019 0921   GLUCOSEU NEG mg/dL 10/30/2009 1944   HGBUR NEGATIVE 04/16/2019 0921   BILIRUBINUR NEGATIVE 04/16/2019 0921   BILIRUBINUR negative 05/04/2018 0910   KETONESUR NEGATIVE 04/16/2019 0921   PROTEINUR 100 (A) 04/16/2019 0921   UROBILINOGEN 0.2 05/04/2018 0910   UROBILINOGEN 1 02/14/2014 0946   NITRITE NEGATIVE 04/16/2019 0921    LEUKOCYTESUR TRACE (A) 04/16/2019 0921   Sepsis Labs Invalid input(s): PROCALCITONIN,  WBC,  LACTICIDVEN Microbiology Recent Results (from the past 240 hour(s))  SARS CORONAVIRUS 2 (TAT 6-24 HRS) Nasopharyngeal Nasopharyngeal Swab     Status: None   Collection Time: 05/18/19 11:56 AM   Specimen: Nasopharyngeal Swab  Result Value Ref Range Status   SARS Coronavirus 2 NEGATIVE NEGATIVE Final    Comment: (NOTE) SARS-CoV-2 target nucleic acids are NOT DETECTED. The SARS-CoV-2 RNA is generally detectable in upper and lower respiratory  specimens during the acute phase of infection. Negative results do not preclude SARS-CoV-2 infection, do not rule out co-infections with other pathogens, and should not be used as the sole basis for treatment or other patient management decisions. Negative results must be combined with clinical observations, patient history, and epidemiological information. The expected result is Negative. Fact Sheet for Patients: SugarRoll.be Fact Sheet for Healthcare Providers: https://www.woods-mathews.com/ This test is not yet approved or cleared by the [place] FDA and  has been authorized for detection and/or diagnosis of SARS-CoV-2 by FDA under an Emergency Use Authorization (EUA). This EUA will remain  in effect (meaning this test can be used) for the duration of the COVID-19 declaration under Section 56 4(b)(1) of the Act, 21 U.S.C. section 360bbb-3(b)(1), unless the authorization is terminated or revoked sooner. Performed at [facility], [place] [address] [place], [place] [address]   Body fluid culture     Status: None   Collection Time: 05/19/19  9:33 AM   Specimen: Lung, Left; Pleural Fluid  Result Value Ref Range Status   Specimen Description PLEURAL LEFT  Final   Special Requests NONE  Final   Gram Stain   Final    RARE WBC PRESENT, PREDOMINANTLY MONONUCLEAR NO ORGANISMS SEEN    Culture   Final     NO GROWTH 3 DAYS Performed at [facility], [place] [address] [place], [place] [address]    Report Status 05/22/2019 FINAL  Final  MRSA PCR Screening     Status: None   Collection Time: 05/20/19  6:39 AM   Specimen: Nasal Mucosa; Nasopharyngeal  Result Value Ref Range Status   MRSA by PCR NEGATIVE NEGATIVE Final    Comment:        The GeneXpert MRSA Assay (FDA approved for NASAL specimens only), is one component of a comprehensive MRSA colonization surveillance program. It is not intended to diagnose MRSA infection nor to guide or monitor treatment for MRSA infections. Performed at [facility], [place] [address] [place], [place] [address]      Patient was seen and examined on the day of discharge and was found to be in stable condition. Time coordinating discharge: 45 minutes including assessment and coordination of care, as well as examination of the patient.   SIGNED:  Dessa Phi, DO [facility] 05/23/2019, 10:31 AM

## 2019-05-23 NOTE — Progress Notes (Signed)
PT. BP elevated. BP meds given and ineffective. On call for [facility] paged to make aware.

## 2019-05-23 NOTE — Care Management Important Message (Signed)
Important Message  Patient Details  Name: Monique Henderson MRN: [id number] Date of Birth: 1951-04-27   Medicare Important Message Given:  Yes     [name] 05/23/2019, 1:37 PM

## 2019-05-23 NOTE — Plan of Care (Signed)
  Problem: Activity: Goal: Risk for activity intolerance will decrease Outcome: Progressing   Problem: Pain Managment: Goal: General experience of comfort will improve Outcome: Progressing   

## 2019-05-23 NOTE — Plan of Care (Signed)
  Problem: Safety: Goal: Ability to remain free from injury will improve Outcome: Progressing   Problem: Pain Managment: Goal: General experience of comfort will improve Outcome: Not Progressing  Pain continues to have pain.

## 2019-05-23 NOTE — Progress Notes (Signed)
Pt states she feels unsafe to go home and still feels "sick" nauseous. Zofran, pain meds given. MD aware.

## 2019-05-23 NOTE — Progress Notes (Signed)
ANTICOAGULATION CONSULT NOTE - Follow-Up Consult  Pharmacy Consult for Warfarin Indication: stroke, atrial fibrillation  Patient Measurements: Height: 5\' 3"  (160 cm) Weight: 105 lb 13.1 oz (48 kg) IBW/kg (Calculated) : 52.4   Vital Signs: Temp: 98.2 F (36.8 C) (12/22 0900) Temp Source: Oral (12/22 0900) BP: 184/98 (12/22 0930) Pulse Rate: 87 (12/22 0930)  Labs: Recent Labs    05/21/19 0430 05/22/19 0520 05/23/19 0449  HGB 9.0*  --  9.5*  HCT 29.8*  --  30.6*  PLT 623*  --  712*  LABPROT  --   --  14.7  INR  --   --  1.2  CREATININE 4.02* 6.11* 6.76*    Estimated Creatinine Clearance: 6.2 mL/min (A) (by C-G formula based on SCr of 6.76 mg/dL (H)).   Assessment: 68 yr old female with small acute L parietal infarction. Active medical problems include: HTN, L pleural effusion, a fib with RVR, ESRD (TTSa HD), malnutrition.  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  Pharmacy is consulted to dose warfarin (without heparin bridge) for CVA; also with afib. Pt is taking ASA 325 mg PO daily. She is also receiving heparin 5000 units subcutaneous Q 8 hrs for VTE prophylaxis.  INR today is SUBtherapeutic (INR 1.2, goal of 2-3) - no recent baseline to review trends. CBC stable - no bleeding noted. Will continue with the low dose again this evening.   Goal of Therapy:  INR 2-3 Monitor platelets by anticoagulation protocol: Yes   Plan:  - Warfarin 2.5 mg x 1 dost at 1800 today - Continue Heparin subcutaneous for VTE prophylaxis and ASA for secondary CVA prophylaxis until INR>2 - Daily PT/INR, CBC q72h - Will continue to monitor for any signs/symptoms of bleeding and will follow up with PT/INR in the a.m.    Thank you for allowing pharmacy to be a part of this patient's care.  Alycia Rossetti, PharmD, BCPS Clinical Pharmacist Clinical  phone for 05/23/2019: [phone] 05/23/2019 10:25 AM   **Pharmacist phone directory can now be found on [email or web address] ([email or web address]).  Listed under [facility].

## 2019-05-23 NOTE — Progress Notes (Signed)
Pt still feel nauseous, RN offered prn phenergan 3x. Pt refused, stated she'll wait awhile. Will try again later.

## 2019-05-23 NOTE — Progress Notes (Signed)
Monique Henderson ROUNDING NOTE   Subjective:   This is a 68 year old lady with a history of  repair of ascending thoracic aortic aneurysm 04/04/2019 Dr. Orvan Henderson.  She also has a history of hypertension, tobacco abuse, breast cancer atrial fibrillation end-stage renal disease Tuesday Thursday Saturday, she has hepatitis C lumbar spinal stenosis.  She was sent from the outpatient unit with complaints of shortness of breath and chest pain.  CT scan of chest in the emergency department was negative for pulmonary embolus but revealed a large left-sided pleural effusion.  Cardiothoracic surgery was consulted and they recommended thoracentesis with removal of 320 cc of serosanguineous fluid.  Henderson course was complicated by an episode of lethargy and altered mental status with subsequent CT scan of the head and MRI that showed a small acute left parietal infarct.  This appears to have resolved  Blood pressure 184/98 pulse 87 temperature 98.2 O2 sats 100% room air removal of 500 cc with dialysis 05/20/2019.  She is currently receiving a dialysis treatment 05/23/2019 and tolerating it well.  Sodium 136 potassium 4.5 chloride 96 CO2 25 BUN 41 creatinine 6.76 glucose 119 calcium 9.5 WBC 11.2 hemoglobin 9.5 platelets 712   Aspirin 325 mg daily, hydralazine 25 mg every 8 hours, metoprolol 50 mg twice daily, Hectorol 1 mcg Tuesday Thursday Saturday  Objective:  Vital signs in last 24 hours:  Temp:  [97.1 F (36.2 C)-99.6 F (37.6 C)] 98.2 F (36.8 C) (12/22 0900) Pulse Rate:  [86-97] 87 (12/22 0930) Resp:  [13-22] 18 (12/22 0930) BP: (175-199)/(90-101) 184/98 (12/22 0930) SpO2:  [92 %-100 %] 100 % (12/22 0930) Weight:  [47.9 kg-48 kg] 48 kg (12/22 0900)  Weight change: -1.3 kg Filed Weights   05/22/19 0500 05/23/19 0516 05/23/19 0900  Weight: 49.2 kg 47.9 kg 48 kg    Intake/Output: I/O last 3 completed shifts: In: 900 [P.O.:900] Out: 1 [Stool:1]   Intake/Output this shift:  Total  I/O In: 240 [P.O.:240] Out: -  Pleasant lady nondistressed CVS- RRR no murmurs rubs or gallops RS- CTA no wheeze or rales ABD- BS present soft non-distended EXT- no edema left AV fistula maturing Monique Henderson   Basic Metabolic Panel: Recent Labs  Lab 05/19/19 0423 05/20/19 0243 05/21/19 0430 05/22/19 0520 05/23/19 0449  NA 133* 132* 136 135 136  K 4.9 4.5 4.0 4.4 4.5  CL 97* 96* 97* 96* 96*  CO2 20* 24 28 25 25   GLUCOSE 92 166* 109* 108* 119*  BUN 15 21 18  34* 41*  CREATININE 3.95* 6.53* 4.02* 6.11* 6.76*  CALCIUM 8.8* 8.8* 8.3* 8.9 9.4  PHOS  --   --  3.8  --   --     Liver Function Tests: Recent Labs  Lab 05/18/19 1152 05/20/19 0243  AST 14* 11*  ALT 8 8  ALKPHOS 73 66  BILITOT 1.0 0.3  PROT 7.8 6.2*  ALBUMIN 2.6* 2.2*   No results for input(s): LIPASE, AMYLASE in the last 168 hours. Recent Labs  Lab 05/20/19 0243  AMMONIA 22    CBC: Recent Labs  Lab 05/18/19 1152 05/19/19 0423 05/20/19 0243 05/21/19 0430 05/23/19 0449  WBC 11.2* 10.4 9.8 9.7 11.2*  HGB 10.8* 9.5* 8.6* 9.0* 9.5*  HCT 35.1* 30.9* 28.9* 29.8* 30.6*  MCV 91.6 91.7 94.1 92.0 88.7  PLT 683* 629* 587* 623* 712*    Cardiac Enzymes: No results for input(s): CKTOTAL, CKMB, CKMBINDEX, TROPONINI in the last 168 hours.  BNP: Invalid input(s): POCBNP  CBG: Recent Labs  Lab 05/19/19 2139 05/21/19 1116 05/21/19 1627  GLUCAP 123* 139* 136*    Microbiology: Results for orders placed or performed during the Henderson encounter of 05/18/19  SARS CORONAVIRUS 2 (TAT 6-24 HRS) Nasopharyngeal Nasopharyngeal Swab     Status: None   Collection Time: 05/18/19 11:56 AM   Specimen: Nasopharyngeal Swab  Result Value Ref Range Status   SARS Coronavirus 2 NEGATIVE NEGATIVE Final    Comment: (NOTE) SARS-CoV-2 target nucleic acids are NOT DETECTED. The SARS-CoV-2 RNA is generally detectable in upper and lower respiratory specimens during the acute phase of infection. Negative results do not preclude  SARS-CoV-2 infection, do not rule out co-infections with other pathogens, and should not be used as the sole basis for treatment or other patient management decisions. Negative results must be combined with clinical observations, patient history, and epidemiological information. The expected result is Negative. Fact Sheet for Patients: SugarRoll.be Fact Sheet for Healthcare Providers: https://www.woods-mathews.com/ This test is not yet approved or cleared by the [place] FDA and  has been authorized for detection and/or diagnosis of SARS-CoV-2 by FDA under an Emergency Use Authorization (EUA). This EUA will remain  in effect (meaning this test can be used) for the duration of the COVID-19 declaration under Section 56 4(b)(1) of the Act, 21 U.S.C. section 360bbb-3(b)(1), unless the authorization is terminated or revoked sooner. Performed at [facility], [place] [address], [place], [place] [address]   Body fluid culture     Status: None   Collection Time: 05/19/19  9:33 AM   Specimen: Lung, Left; Pleural Fluid  Result Value Ref Range Status   Specimen Description PLEURAL LEFT  Final   Special Requests NONE  Final   Gram Stain   Final    RARE WBC PRESENT, PREDOMINANTLY MONONUCLEAR NO ORGANISMS Henderson    Culture   Final    NO GROWTH 3 DAYS Performed at [facility], [place] [address], [place], [place] [address]    Report Status 05/22/2019 FINAL  Final  MRSA PCR Screening     Status: None   Collection Time: 05/20/19  6:39 AM   Specimen: Nasal Mucosa; Nasopharyngeal  Result Value Ref Range Status   MRSA by PCR NEGATIVE NEGATIVE Final    Comment:        The GeneXpert MRSA Assay (FDA approved for NASAL specimens only), is one component of a comprehensive MRSA colonization surveillance program. It is not intended to diagnose MRSA infection nor to guide or monitor treatment for MRSA infections. Performed at [facility], [place] [address], [place], [place] [address]     Coagulation Studies: Recent Labs    05/23/19 0449  LABPROT 14.7  INR 1.2    Urinalysis: No results for input(s): COLORURINE, LABSPEC, PHURINE, GLUCOSEU, HGBUR, BILIRUBINUR, KETONESUR, PROTEINUR, UROBILINOGEN, NITRITE, LEUKOCYTESUR in the last 72 hours.  Invalid input(s): APPERANCEUR    Imaging: MR ANGIO HEAD WO CONTRAST  Result Date: 05/22/2019 CLINICAL DATA:  Neuro deficit with stroke suspected EXAM: MRA HEAD WITHOUT CONTRAST TECHNIQUE: Angiographic images of the Circle of Willis were obtained using MRA technique without intravenous contrast. COMPARISON:  Brain MRI from yesterday FINDINGS: Symmetric carotid arteries. Undulating appearance the distal right ICA could be artifact or from fibromuscular dysplasia. Undulation at the skull base is typically artifact. Mild-to-moderate generalized atherosclerotic irregularity of medium size vessels. No branch occlusion or proximal flow reducing stenosis. IMPRESSION: 1. Mild-to-moderate generalized atherosclerotic irregularity of medium size vessels. 2. Beaded appearance of the right more than  left distal cervical ICA which could be artifact or fibromuscular dysplasia. Electronically Signed   By: Monte Fantasia M.D.   On: 05/22/2019 06:51   MR BRAIN WO CONTRAST  Result Date: 05/21/2019 CLINICAL DATA:  Encephalopathy. EXAM: MRI HEAD WITHOUT CONTRAST TECHNIQUE: Multiplanar, multiecho pulse sequences of the brain and surrounding structures were obtained without intravenous contrast. COMPARISON:  Head CT 05/19/2019 FINDINGS: Brain: There is a 1 cm curvilinear acute infarct involving left parietal white matter. A single focus of chronic microhemorrhage is noted in the right parietal lobe. Patchy to confluent T2 hyperintensities in the cerebral white matter bilaterally are nonspecific but compatible with chronic small vessel ischemic disease, moderate to severe for age. T2 hyperintensities throughout the  basal ganglia and thalami likely represent a combination of chronic small vessel ischemia including chronic lacunar infarcts as well as dilated perivascular spaces. Mild cerebral atrophy is within normal limits for age. No mass, midline shift, or extra-axial fluid collection is identified. Vascular: Major intracranial vascular flow voids are preserved. Skull and upper cervical spine: No suspicious marrow lesion. Sinuses/Orbits: Unremarkable orbits. Paranasal sinuses and mastoid air cells are clear. Other: None. IMPRESSION: 1. Small acute left parietal white matter infarct. 2. Moderate to severe chronic small vessel ischemic disease. Electronically Signed   By: Logan Bores M.D.   On: 05/21/2019 14:31   ECHOCARDIOGRAM COMPLETE BUBBLE STUDY  Result Date: 05/22/2019   ECHOCARDIOGRAM REPORT   Patient Name:   Monique Henderson Date of Exam: 05/22/2019 Medical Rec #:  [id number]          Height:       65.0 in Accession #:    [id number]         Weight:       125.0 lb Date of Birth:  November 06, 1950           BSA:          1.62 m Patient Age:    35 years           BP:           159/82 mmHg Patient Gender: F                  HR:           82 bpm. Exam Location:  Inpatient Procedure: 2D Echo and Saline Contrast Bubble Study Indications:    Stroke I63.9  History:        Patient has prior history of Echocardiogram examinations, most                 recent 04/04/2019. Thoracic Aortic Aneurysm Repair; Risk                 Factors:Hypertension.  Sonographer:    Mikki Santee RDCS (AE) Referring Phys: [id number] Lanice Schwab AROOR IMPRESSIONS  1. Left ventricular ejection fraction, by visual estimation, is 65 to 70%. The left ventricle has hyperdynamic function. There is mildly increased left ventricular hypertrophy.  2. Left ventricular diastolic parameters are consistent with Grade II diastolic dysfunction (pseudonormalization).  3. The left ventricle has no regional wall motion abnormalities.  4. Global right ventricle has normal  systolic function.The right ventricular size is normal. No increase in right ventricular wall thickness.  5. Left atrial size was normal.  6. Right atrial size was mildly dilated.  7. The mitral valve is normal in structure. Trivial mitral valve regurgitation. No evidence of mitral stenosis.  8. The tricuspid valve is normal in structure. Tricuspid valve regurgitation  is severe.  9. The aortic valve is normal in structure. Aortic valve regurgitation is not visualized. No evidence of aortic valve sclerosis or stenosis. 10. The pulmonic valve was normal in structure. Pulmonic valve regurgitation is not visualized. 11. Moderately elevated pulmonary artery systolic pressure. 12. The tricuspid regurgitant velocity is 3.22 m/s, and with an assumed right atrial pressure of 3 mmHg, the estimated right ventricular systolic pressure is moderately elevated at 44.3 mmHg. 13. The inferior vena cava is normal in size with greater than 50% respiratory variability, suggesting right atrial pressure of 3 mmHg. 14. No intracardiac thrombi or masses were visualized. FINDINGS  Left Ventricle: Left ventricular ejection fraction, by visual estimation, is 65 to 70%. The left ventricle has hyperdynamic function. The left ventricle has no regional wall motion abnormalities. There is mildly increased left ventricular hypertrophy. Left ventricular diastolic parameters are consistent with Grade II diastolic dysfunction (pseudonormalization). Normal left atrial pressure. Right Ventricle: The right ventricular size is normal. No increase in right ventricular wall thickness. Global RV systolic function is has normal systolic function. The tricuspid regurgitant velocity is 3.22 m/s, and with an assumed right atrial pressure  of 3 mmHg, the estimated right ventricular systolic pressure is moderately elevated at 44.3 mmHg. Left Atrium: Left atrial size was normal in size. Right Atrium: Right atrial size was mildly dilated Pericardium: There is no  evidence of pericardial effusion. Mitral Valve: The mitral valve is normal in structure. Trivial mitral valve regurgitation. No evidence of mitral valve stenosis by observation. Tricuspid Valve: The tricuspid valve is normal in structure. Tricuspid valve regurgitation is severe. Aortic Valve: The aortic valve is normal in structure. Aortic valve regurgitation is not visualized. The aortic valve is structurally normal, with no evidence of sclerosis or stenosis. Pulmonic Valve: The pulmonic valve was normal in structure. Pulmonic valve regurgitation is not visualized. Pulmonic regurgitation is not visualized. Aorta: The aortic root, ascending aorta and aortic arch are all structurally normal, with no evidence of dilitation or obstruction. Venous: The inferior vena cava is normal in size with greater than 50% respiratory variability, suggesting right atrial pressure of 3 mmHg. IAS/Shunts: No atrial level shunt detected by color flow Doppler. Agitated saline contrast was given intravenously to evaluate for intracardiac shunting. Saline contrast bubble study was negative, with no evidence of any interatrial shunt. There is no evidence of a patent foramen ovale. No ventricular septal defect is Henderson or detected. There is no evidence of an atrial septal defect. Additional Comments: No intracardiac thrombi or masses were visualized.  LEFT VENTRICLE PLAX 2D LVIDd:         4.20 cm  Diastology LVIDs:         2.50 cm  LV e' lateral:   5.11 cm/s LV PW:         1.30 cm  LV E/e' lateral: 28.2 LV IVS:        1.00 cm  LV e' medial:    6.64 cm/s LVOT diam:     2.00 cm  LV E/e' medial:  21.7 LV SV:         56 ml LV SV Index:   34.91 LVOT Area:     3.14 cm  RIGHT VENTRICLE RV S prime:     6.57 cm/s TAPSE (M-mode): 0.8 cm LEFT ATRIUM           Index       RIGHT ATRIUM           Index LA diam:  2.50 cm 1.54 cm/m  RA Area:     13.90 cm LA Vol (A2C): 22.9 ml 14.14 ml/m RA Volume:   30.10 ml  18.58 ml/m LA Vol (A4C): 27.8 ml 17.16  ml/m  AORTIC VALVE LVOT Vmax:   126.00 cm/s LVOT Vmean:  71.500 cm/s LVOT VTI:    0.232 m  AORTA Ao Root diam: 2.70 cm MITRAL VALVE                         TRICUSPID VALVE MV Area (PHT): 3.63 cm              TR Peak grad:   41.3 mmHg MV PHT:        60.61 msec            TR Vmax:        344.00 cm/s MV Decel Time: 209 msec MV E velocity: 144.00 cm/s 103 cm/s  SHUNTS MV A velocity: 116.00 cm/s 70.3 cm/s Systemic VTI:  0.23 m MV E/A ratio:  1.24        1.5       Systemic Diam: 2.00 cm  Candee Furbish MD Electronically signed by Candee Furbish MD Signature Date/Time: 05/22/2019/11:07:03 AM    Final    VAS US CAROTID  Result Date: 05/22/2019 Carotid Arterial Duplex Study Indications:       CVA. Risk Factors:      Hypertension. Comparison Study:  no prior Performing Technologist: Abram Sander RVS  Examination Guidelines: A complete evaluation includes B-mode imaging, spectral Doppler, color Doppler, and power Doppler as needed of all accessible portions of each vessel. Bilateral testing is considered an integral part of a complete examination. Limited examinations for reoccurring indications may be performed as noted.  Right Carotid Findings: +----------+--------+--------+--------+------------------+--------+           PSV cm/sEDV cm/sStenosisPlaque DescriptionComments +----------+--------+--------+--------+------------------+--------+ CCA Prox  63      15                                         +----------+--------+--------+--------+------------------+--------+ CCA Distal48      20                                         +----------+--------+--------+--------+------------------+--------+ ICA Prox  104     42      40-59%                    tortuous +----------+--------+--------+--------+------------------+--------+ ICA Mid   90      31                                         +----------+--------+--------+--------+------------------+--------+ ICA Distal151     62                                          +----------+--------+--------+--------+------------------+--------+ ECA                       Occluded                           +----------+--------+--------+--------+------------------+--------+ +----------+--------+-------+--------+-------------------+  PSV cm/sEDV cmsDescribeArm Pressure (mmHG) +----------+--------+-------+--------+-------------------+ [id number]                                         +----------+--------+-------+--------+-------------------+ +---------+--------+--+--------+--+---------+ VertebralPSV cm/s65EDV cm/s18Antegrade +---------+--------+--+--------+--+---------+  Left Carotid Findings: +----------+--------+--------+--------+------------------+--------------+           PSV cm/sEDV cm/sStenosisPlaque DescriptionComments       +----------+--------+--------+--------+------------------+--------------+ CCA Prox  76      20                                               +----------+--------+--------+--------+------------------+--------------+ CCA Distal57      15                                               +----------+--------+--------+--------+------------------+--------------+ ICA Prox  136     43      40-59%                    tortuous       +----------+--------+--------+--------+------------------+--------------+ ICA Mid   66      25                                               +----------+--------+--------+--------+------------------+--------------+ ICA Distal98      34                                               +----------+--------+--------+--------+------------------+--------------+ ECA       42                                        near occlusion +----------+--------+--------+--------+------------------+--------------+ +---------+--------+--+--------+--+ VertebralPSV cm/s47EDV cm/s15 +---------+--------+--+--------+--+  Summary: Right Carotid: Velocities in the right  ICA are consistent with a 40-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 The ECA appears occluded. Left Carotid: Velocities in the left ICA are consistent with a 40-59% stenosis. Vertebrals: Bilateral vertebral arteries demonstrate antegrade flow. *See table(s) above for measurements and observations.  Electronically signed by Antony Contras MD on 05/22/2019 at 12:50:21 PM.    Final      Medications:   . sodium chloride 1,000 mL (05/20/19 0029)   . aspirin EC  325 mg Oral Daily  . Chlorhexidine Gluconate Cloth  6 each Topical Q0600  . Chlorhexidine Gluconate Cloth  6 each Topical Q0600  . doxercalciferol  1 mcg Intravenous Q T,Th,Sa-HD  . famotidine  10 mg Oral QHS  . feeding supplement  1 Container Oral BID BM  . feeding supplement (PRO-STAT SUGAR FREE 64)  30 mL Oral BID BM  . heparin  5,000 Units Subcutaneous Q8H  . hydrALAZINE  25 mg Oral Q8H  . metoprolol tartrate  50 mg Oral BID  . multivitamin  1 tablet Oral QHS  .  Warfarin - Pharmacist Dosing Inpatient   Does not apply q1800   sodium chloride, albuterol, alum & mag hydroxide-simeth, ondansetron **OR** ondansetron (ZOFRAN) IV, oxyCODONE, polyethylene glycol  Assessment/ Plan:  1. SOB 2/2 pleural effusion - s/p thoracentesis 05/19/2019.  320 cc removed Improved.   Interestingly shows serosanguineous fluid suggestive of exudate cultures no growth 2. Altered mental status - Improved today.   MRI small acute left parietal white matter infarct moderate to severe chronic small vessel disease echo and carotid Dopplers pending neurology following.  CT no acute intracranial abnormalities ammonia level 22 EEG background slowing.  Appears to have resolved. 3. End-stage renal disease Tuesday Thursday Saturday dialysis.-tolerated dialysis 05/20/1999 2500 cc removed.  She appears to be tolerating dialysis treatment 05/23/2019 4. Hypertension/volume - BP variable, mostly well controlled, on metoprolol 50mg  BID and hydralazine 25mg  TID. Does not appear volume  overloaded. Minimal gains at OP HD. Net UF removed 528mL.  We will continue dialysis outpatient schedule which will be Tuesday Thursday Saturday 5. Anemia of CKD - Hgb 9.0. Recently dosed with ESA. Continue to follow.  6. Secondary Hyperparathyroidism - Ca in goal. Will check phos. Continue VDRA. Not on binders.  7. Nutrition - Renal diet w/fluid restrictions 8. 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 Orvan Henderson 9. Disposition patient stable for discharge from renal standpoint discussed with primary service will discharge after dialysis treatment    LOS: [place] @TODAY @9 :47 AM

## 2019-05-23 NOTE — Progress Notes (Signed)
  PROGRESS NOTE  Patient had complaints of some abdominal pain and nausea after HD today. Gave zofran and oxycodone with little relief. Patient does not feel ready for discharge home today.    Dessa Phi, DO [facility] 05/23/2019, 4:30 PM  Available via Epic secure chat 7am-7pm After these hours, please refer to coverage provider listed on [email or web address]

## 2019-05-24 DIAGNOSIS — E440 Moderate protein-calorie malnutrition: Secondary | ICD-10-CM

## 2019-05-24 DIAGNOSIS — I6330 Cerebral infarction due to thrombosis of unspecified cerebral artery: Secondary | ICD-10-CM

## 2019-05-24 LAB — BASIC METABOLIC PANEL
Anion gap: 17 — ABNORMAL HIGH (ref 5–15)
BUN: 18 mg/dL (ref 8–23)
CO2: 21 mmol/L — ABNORMAL LOW (ref 22–32)
Calcium: 9.5 mg/dL (ref 8.9–10.3)
Chloride: 96 mmol/L — ABNORMAL LOW (ref 98–111)
Creatinine, Ser: 4.16 mg/dL — ABNORMAL HIGH (ref 0.44–1.00)
GFR calc Af Amer: 12 mL/min — ABNORMAL LOW (ref >60)
GFR calc non Af Amer: 10 mL/min — ABNORMAL LOW (ref >60)
Glucose, Bld: 110 mg/dL — ABNORMAL HIGH (ref 70–99)
Potassium: 4 mmol/L (ref 3.5–5.1)
Sodium: 134 mmol/L — ABNORMAL LOW (ref 135–145)

## 2019-05-24 LAB — PROTIME-INR
INR: 1.1 (ref 0.8–1.2)
Prothrombin Time: 14.2 seconds (ref 11.4–15.2)

## 2019-05-24 LAB — CBC
HCT: 30.8 % — ABNORMAL LOW (ref 36.0–46.0)
Hemoglobin: 9.7 g/dL — ABNORMAL LOW (ref 12.0–15.0)
MCH: 27.6 pg (ref 26.0–34.0)
MCHC: 31.5 g/dL (ref 30.0–36.0)
MCV: 87.5 fL (ref 80.0–100.0)
Platelets: 641 10*3/uL — ABNORMAL HIGH (ref 150–400)
RBC: 3.52 MIL/uL — ABNORMAL LOW (ref 3.87–5.11)
RDW: 17.6 % — ABNORMAL HIGH (ref 11.5–15.5)
WBC: 11.4 10*3/uL — ABNORMAL HIGH (ref 4.0–10.5)
nRBC: 0.4 % — ABNORMAL HIGH (ref 0.0–0.2)

## 2019-05-24 MED ORDER — AMLODIPINE BESYLATE 10 MG PO TABS
10.0000 mg | ORAL_TABLET | Freq: Every day | ORAL | Status: DC
  Administered 2019-05-24: 10 mg via ORAL
  Filled 2019-05-24: qty 1

## 2019-05-24 MED ORDER — CHLORHEXIDINE GLUCONATE CLOTH 2 % EX PADS: 6.0000 | MEDICATED_PAD | Freq: Every day | CUTANEOUS | Status: DC

## 2019-05-24 MED ORDER — CLONIDINE HCL 0.3 MG/24HR TD PTWK
0.3000 mg | MEDICATED_PATCH | TRANSDERMAL | Status: DC
  Filled 2019-05-24: qty 1

## 2019-05-24 MED ORDER — HYDRALAZINE HCL 20 MG/ML IJ SOLN
5.0000 mg | Freq: Once | INTRAMUSCULAR | Status: AC
  Administered 2019-05-24: 5 mg via INTRAVENOUS
  Filled 2019-05-24: qty 1

## 2019-05-24 NOTE — Discharge Summary (Signed)
Physician Discharge Summary  [name] WUX:[id number] DOB: 1950/06/20 DOA: 05/18/2019  PCP: [name], MD  Admit date: 05/18/2019 Discharge date: 05/24/2019  Admitted From: Home Disposition: Home with home health  Recommendations for Outpatient Follow-up:  1. Follow up with PCP in 1 week 2. Follow up with neurology in 4 to 6 weeks 3. INR checks at HD  4. Follow up with Dr. 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 She was referred from the outpatient hemodialysis center to the emergency room because of complaints of shortness of breath. She said she has been feeling short of breath and having chest pain since she had her heart surgery on 04/04/2019. Chest pain is usually across her chest and this is nothing new since her surgery. However, shortness of breath has progressively worsened. Shortness of breath is worsened by mild exertion while talking.  CT chest in the emergency department was negative for PE, but did find an increased left pleural effusion.  Cardiothoracic surgery team recommended patient to undergo thoracentesis.  Patient underwent thoracentesis 12/18 with 320 mL serosanguineous fluid removed.  Overnight 12/18-12/19, patient had an episode of lethargy and not responding to questions.  Due to concern for sudden change of her level of consciousness, neurology was consulted and patient underwent CT head.  This was thought to be secondary to toxic metabolic encephalopathy but could not rule out seizures.  Patient was given benzodiazepine as well as  fosphenytoin. MRI was positive for small acute left parietal infarction.  Due to history of atrial fibrillation, patient was started on Coumadin.  She had no further episodes, her episode overnight on 12/18 was thought to be metabolic encephalopathy and not due to seizure.  She had no further episodes.  Patient refused DC yesterday due to feeling generally unwell. No acute issues/events overnight. Now agreeable for DC.  Discharge Diagnoses:  Active Problems:   Essential hypertension   Pleural effusion on left   Atrial fibrillation with RVR (HCC)   Malnutrition of moderate degree   Cerebral thrombosis with cerebral infarction   Moderate left pleural effusion -Status post thoracentesis 12/18 with 320 mL serosanguineous fluid removed.  Pleural studies suggestive of exudative fluid.  Culture pending. -Repeat chest x-ray 12/18 without postprocedural pneumothorax -Repeat chest x-ray 12/18 small left effusion with left base atelectasis  Acute left parietal CVA  -MRI: Small acute left parietal white matter infarct. Moderate to severe chronic small vessel ischemic disease. -MRA: Mild-to-moderate generalized atherosclerotic irregularity of medium size vessels. -Carotid 40 to 59% stenosis bilaterally -Echo EF 65 to 70%, no intracardiac thrombi or masses visualized -PT OT eval rec home health -Neurology following; follow-up as an outpatient -Started Coumadin due to history of atrial fibrillation -LDL 56  Acute toxic metabolic encephalopathy -CT head without acute intracranial abnormality -Ammonia 22 -EEG abnormal secondary tomildposterior background slowing. This finding may be seen with a diffuse gray matter disturbance that is etiologically nonspecific, but may include a dementia, among other possibilities. No epileptiform activity is noted. 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 Orvan Seen 04/04/2019  Paroxysmal  atrial fibrillation with RVR -Patient did receive IV Cardizem drip at the time of  admission -She had recently finished amiodarone therapy as an outpatient and now is no longer taking amiodarone -Back to normal sinus rhythm.  Continue metoprolol  ESRD on dialysis -Nephrology following   Hypertension -Hydralazine, metoprolol  Elevated troponin -Demand ischemia, flat trend without concern for ACS   Discharge Instructions  Discharge Instructions    Ambulatory referral to Neurology   Complete by: As directed    An appointment is requested in approximately: 4 weeks   Call MD for:  difficulty breathing, headache or visual disturbances   Complete by: As directed    Call MD for:  extreme fatigue   Complete by: As directed    Call MD for:  persistant dizziness or light-headedness   Complete by: As directed    Call MD for:  persistant nausea and vomiting   Complete by: As directed    Call MD for:  severe uncontrolled pain   Complete by: As directed    Call MD for:  temperature >100.4   Complete by: As directed    Discharge instructions   Complete by: As directed    You were cared for by a hospitalist during your hospital stay. If you have any questions about your discharge medications or the care you received while you were in the hospital after you are discharged, you can call the unit and ask to speak with the hospitalist on call if the hospitalist that took care of you is not available. Once you are discharged, your primary care physician will handle any further medical issues. Please note that NO REFILLS for any discharge medications will be authorized once you are discharged, as it is imperative that you return to your primary care physician (or establish a relationship with a primary care physician if you do not have one) for your aftercare needs so that they can reassess your need for medications and monitor your lab values.   Increase activity slowly   Complete by: As directed       Allergies as of 05/24/2019      Reactions   Shrimp [shellfish Allergy] Shortness Of Breath   Bactroban [mupirocin] Other (See Comments)   "Sores in nose"   Vicodin [hydrocodone-acetaminophen] Itching, Nausea And Vomiting   This is patient's home medication   Lisinopril Cough   Tylenol [acetaminophen] Itching      Medication List    STOP taking these medications   amiodarone 200 MG tablet Commonly known as: PACERONE   oxyCODONE 5 MG immediate release tablet Commonly known as: Oxy IR/ROXICODONE   pregabalin 75 MG capsule Commonly known as: LYRICA   traMADol 50 MG tablet Commonly known as: ULTRAM     TAKE these medications   albuterol 108 (90 Base) MCG/ACT inhaler Commonly known as: VENTOLIN HFA Inhale 2 puffs into the lungs every 6 (six) hours as needed for wheezing or shortness of breath.   amLODipine 10 MG tablet Commonly known as: NORVASC TAKE 1 TABLET (10 MG TOTAL) BY MOUTH DAILY.   aspirin EC 81 MG tablet Take 1 tablet (81 mg total) by mouth daily. Until INR > 2. Then stop and continue coumadin only. What changed:   additional instructions  Another medication with the same name was removed. Continue taking this medication, and follow the directions you see here.   cloNIDine 0.3 mg/24hr patch Commonly known as: CATAPRES - Dosed in mg/24 hr Place 1 patch (0.3 mg total) onto the skin once a week.   famotidine 10 MG tablet Commonly known as: PEPCID Take  1 tablet (10 mg total) by mouth at bedtime.   hydrALAZINE 25 MG tablet Commonly known as: APRESOLINE Take 1 tablet (25 mg total) by mouth every 8 (eight) hours.   metoprolol tartrate 50 MG tablet Commonly known as: LOPRESSOR Take 1 tablet (50 mg total) by mouth 2 (two) times daily.   multivitamin Tabs tablet Take 1 tablet by mouth at bedtime.   ondansetron 4 MG tablet Commonly known as: Zofran Take 1 tablet (4 mg total) by mouth every 8 (eight) hours as needed for nausea or vomiting.   warfarin  2.5 MG tablet Commonly known as: Coumadin Take 1 tablet (2.5 mg total) by mouth daily.      Follow-up Information    [facility] Follow up.   Specialty: Home Health Services Why: HHPT Contact information: [address] [address] [phone]        [name], MD. Go on 05/29/2019.   Specialty: Family Medicine Why: @4 :00pm Contact information: [address] [place] [address] [phone]        [place]. Schedule an appointment as soon as possible for a visit in 4 week(s).   Contact information: [address]     [place] [phone]       Wonda Olds, MD. Go on 06/05/2019.   Specialty: Cardiothoracic Surgery Contact information: [address] [place] [address] [phone]          Allergies  Allergen Reactions  . Shrimp [Shellfish Allergy] Shortness Of Breath  . Bactroban [Mupirocin] Other (See Comments)    "Sores in nose"  . Vicodin [Hydrocodone-Acetaminophen] Itching and Nausea And Vomiting    This is patient's home medication  . Lisinopril Cough  . Tylenol [Acetaminophen] Itching    Consultations:  Neurology  Nephrology   Procedures/Studies: EEG  Result Date: 05/20/2019 [name], MD     05/20/2019 12:08 PM ELECTROENCEPHALOGRAM REPORT Patient: Monique Henderson       Room #: 9T26Z EEG No. ID: [id number] Age: 68 y.o.        Sex: female Referring Physician: Maylene Roes Report Date:  05/20/2019       Interpreting Physician: [name] History: GETSEMANI LINDON is an 68 y.o. female with altered mental status Medications: ASA, Apresoline, Lopressor Conditions of Recording:  This is a 21 channel routine scalp EEG performed with bipolar and monopolar montages arranged in accordance to the international 10/20 system of electrode placement. One channel was dedicated to EKG recording. The patient is in the awake and  drowsy states. Description:  The waking background activity consists of a low voltage, symmetrical, fairly well organized, 7 Hz theta activity, seen from the parieto-occipital and posterior temporal regions.  Low voltage fast activity, poorly organized, is seen anteriorly and is at times superimposed on more posterior regions.  A mixture of theta and alpha rhythms are seen from the central and temporal regions. The patient drowses with slowing to irregular, low voltage theta and beta activity.  Stage II sleep is not obtained. No epileptiform activity is noted.  Hyperventilation and intermittent photic stimulation were not performed. IMPRESSION: This is an abnormal EEG secondary to mild posterior background slowing.  This finding may be seen with a diffuse gray matter disturbance that is etiologically nonspecific, but may include a dementia, among other possibilities.  No epileptiform activity is noted.  [name], MD Neurology [phone] 05/20/2019, 12:04 PM   DG Chest 1 View  Result Date: 05/19/2019 CLINICAL DATA:  Post left-sided thoracentesis. EXAM: CHEST  1 VIEW COMPARISON:  05/18/2019 FINDINGS: Sternotomy wires and right IJ dialysis catheter unchanged. Lungs are adequately inflated with moderate interval improvement in left-sided pleural effusion with small residual pleural effusions/atelectasis present. No left-sided pneumothorax. Right lung is clear. Cardiomediastinal silhouette and remainder of the exam is unchanged. IMPRESSION: Significant improvement in left pleural effusion post thoracentesis with small residual pleural effusion/basilar atelectasis. No pneumothorax. Electronically Signed   By: Marin Olp M.D.   On: 05/19/2019 09:00   DG Chest 2 View  Result Date: 05/08/2019 CLINICAL DATA:  Aortic aneurysm repair 04/04/2019 shortness of breath and chest pain. EXAM: CHEST - 2 VIEW COMPARISON:  One-view chest x-ray 04/20/2019 FINDINGS: Heart is enlarged. Median sternotomy is noted. Right  IJ dialysis catheter is in place. Aeration of both lungs is markedly improved. Moderate left-sided pleural effusion has improved. Bibasilar airspace opacities likely reflect atelectasis. IMPRESSION: 1. Marked improvement in aeration of both lungs. 2. Moderate left pleural effusion has improved. 3. Bibasilar airspace disease likely reflects atelectasis. Electronically Signed   By: San Morelle M.D.   On: 05/08/2019 10:51   CT HEAD WO CONTRAST  Result Date: 05/19/2019 CLINICAL DATA:  Altered mental status. EXAM: CT HEAD WITHOUT CONTRAST TECHNIQUE: Contiguous axial images were obtained from the base of the skull through the vertex without intravenous contrast. COMPARISON:  None. 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 No acute intracranial abnormality. Specifically, no hemorrhage, hydrocephalus, mass lesion, acute infarction, or significant intracranial injury. Vascular: No hyperdense vessel or unexpected calcification. Skull: No acute calvarial abnormality. Sinuses/Orbits: Visualized paranasal sinuses and mastoids clear. Orbital soft tissues unremarkable. 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 No acute intracranial abnormality. Electronically Signed   By: Rolm Baptise M.D.   On: 05/19/2019 22:41   CT Chest Wo Contrast  Result Date: 05/13/2019 CLINICAL DATA:  Chest pain shortness of breath. Chest pain with breathing. History of an thoracic aortic aneurysm repair. EXAM: CT CHEST WITHOUT CONTRAST TECHNIQUE: Multidetector CT imaging of the chest was performed following the standard protocol without IV contrast. COMPARISON:  04/03/2019 FINDINGS: Cardiovascular: Heart is for line enlarged. Small pericardial effusion. Changes from the pair of the ascending thoracic aortic aneurysm. Atherosclerotic calcifications are noted along the aortic arch and descending thoracic aorta, stable. Mediastinum/Nodes: Thyroid is  unremarkable. No neck base, axillary, mediastinal or hilar masses or enlarged lymph nodes. Trachea and esophagus are unremarkable. Right internal jugular central venous catheter has its tip at the caval atrial junction. Lungs/Pleura: Small left pleural effusion. This is new since the prior CT. There is dependent opacity in the left lower lobe consistent with atelectasis. A component of pneumonia is not excluded. Are additional linear opacities noted in the right lower lobe and right middle and upper lobes consistent atelectasis, scarring or a combination. There is no evidence of pulmonary edema. No right pleural effusion. No pneumothorax. Upper Abdomen: No acute abnormality. Musculoskeletal: No fracture or acute finding. No osteoblastic or osteolytic lesions. IMPRESSION: 1. Small left pleural effusion associated with left lower lobe dependent opacity, which is most likely atelectasis. Consider a component of pneumonia if there are consistent clinical findings. 2. Small pericardial effusion similar to the prior CT. 3. Stable changes from repair of an ascending thoracic aortic aneurysm. 4. Aortic atherosclerosis. Aortic Atherosclerosis (ICD10-I70.0). Electronically Signed   By: Lajean Manes M.D.   On: 05/13/2019 12:04   CT Angio Chest PE W and/or Wo  Contrast  Result Date: 05/18/2019 CLINICAL DATA:  Shortness of breath. EXAM: CT ANGIOGRAPHY CHEST WITH CONTRAST TECHNIQUE: Multidetector CT imaging of the chest was performed using the standard protocol during bolus administration of intravenous contrast. Multiplanar CT image reconstructions and MIPs were obtained to evaluate the vascular anatomy. CONTRAST:  152mL OMNIPAQUE IOHEXOL 350 MG/ML SOLN COMPARISON:  CT chest dated May 13, 2019. FINDINGS: Cardiovascular: Satisfactory opacification of the pulmonary arteries to the segmental level. No evidence of pulmonary embolism. Unchanged mild cardiomegaly and small pericardial effusion. Prior ascending thoracic  aortic graft repair. No dissection or aneurysm. Thoracic aortic and branch vessel atherosclerotic vascular disease. Tunneled right internal jugular dialysis catheter with tip at the cavoatrial junction. Mediastinum/Nodes: No enlarged mediastinal, hilar, or axillary lymph nodes. Thyroid gland, trachea, and esophagus demonstrate no significant findings. Lungs/Pleura: Enlarging now moderate left pleural effusion with near complete collapse of the left lower lobe. New subsegmental atelectasis in the lingula. No focal consolidation, pleural effusion, or pneumothorax. Unchanged scarring at the right lung base. No suspicious pulmonary nodule. Upper Abdomen: No acute abnormality. Musculoskeletal: No chest wall abnormality. No acute or significant osseous findings. Review of the MIP images confirms the above findings. IMPRESSION: 1.  No evidence of pulmonary embolism. 2. Enlarging now moderate left pleural effusion with near complete collapse of the left lower lobe. 3. Unchanged small pericardial effusion. 4.  Aortic atherosclerosis (ICD10-I70.0). Electronically Signed   By: Titus Dubin M.D.   On: 05/18/2019 16:46   MR ANGIO HEAD WO CONTRAST  Result Date: 05/22/2019 CLINICAL DATA:  Neuro deficit with stroke suspected EXAM: MRA HEAD WITHOUT CONTRAST TECHNIQUE: Angiographic images of the Circle of Willis were obtained using MRA technique without intravenous contrast. COMPARISON:  Brain MRI from yesterday FINDINGS: Symmetric carotid arteries. Undulating appearance the distal right ICA could be artifact or from fibromuscular dysplasia. Undulation at the skull base is typically artifact. Mild-to-moderate generalized atherosclerotic irregularity of medium size vessels. No branch occlusion or proximal flow reducing stenosis. IMPRESSION: 1. Mild-to-moderate generalized atherosclerotic irregularity of medium size vessels. 2. Beaded appearance of the right more than left distal cervical ICA which could be artifact or  fibromuscular dysplasia. Electronically Signed   By: Monte Fantasia M.D.   On: 05/22/2019 06:51   MR BRAIN WO CONTRAST  Result Date: 05/21/2019 CLINICAL DATA:  Encephalopathy. EXAM: MRI HEAD WITHOUT CONTRAST TECHNIQUE: Multiplanar, multiecho pulse sequences of the brain and surrounding structures were obtained without intravenous contrast. COMPARISON:  Head CT 05/19/2019 FINDINGS: Brain: There is a 1 cm curvilinear acute infarct involving left parietal white matter. A single focus of chronic microhemorrhage is noted in the right parietal lobe. Patchy to confluent T2 hyperintensities in the cerebral white matter bilaterally are nonspecific but compatible with chronic small vessel ischemic disease, moderate to severe for age. T2 hyperintensities throughout the basal ganglia and thalami likely represent a combination of chronic small vessel ischemia including chronic lacunar infarcts as well as dilated perivascular spaces. Mild cerebral atrophy is within normal limits for age. No mass, midline shift, or extra-axial fluid collection is identified. Vascular: Major intracranial vascular flow voids are preserved. Skull and upper cervical spine: No suspicious marrow lesion. Sinuses/Orbits: Unremarkable orbits. Paranasal sinuses and mastoid air cells are clear. Other: None. IMPRESSION: 1. Small acute left parietal white matter infarct. 2. Moderate to severe chronic small vessel ischemic disease. Electronically Signed   By: Logan Bores M.D.   On: 05/21/2019 14:31   DG Chest Port 1 View  Result Date: 05/19/2019 CLINICAL DATA:  Shortness of  breath EXAM: PORTABLE CHEST 1 VIEW COMPARISON:  05/19/2019 FINDINGS: Prior CABG. Cardiomegaly. Small left pleural effusion with left base atelectasis. Right lung clear. No acute bony abnormality. IMPRESSION: Cardiomegaly.  Small left effusion with left base atelectasis. Electronically Signed   By: Rolm Baptise M.D.   On: 05/19/2019 23:11   DG Chest Port 1 View  Result Date:  05/18/2019 CLINICAL DATA:  Chest pain EXAM: PORTABLE CHEST 1 VIEW COMPARISON:  Five days ago FINDINGS: Mild increase in left pleural effusion obscuring the left lower lobe which was atelectatic on the recent chest CT. New mild right base atelectasis. Postoperative heart. The right lung is clear. Dialysis catheter on the right with tip at the upper cavoatrial junction. IMPRESSION: Progression of left pleural effusion and atelectasis seen on recent chest CT. Electronically Signed   By: Monte Fantasia M.D.   On: 05/18/2019 11:49   XR Chest Portable  Result Date: 05/13/2019 CLINICAL DATA:  Pt arrives by [facility] from HD. Per EMS pt was about 1.5/4hr treatment when pt began to complain of sharp CP when she was breathing. EKG unremarkable. EXAM: PORTABLE CHEST 1 VIEW COMPARISON:  Chest radiograph 05/08/2019 FINDINGS: Stable cardiomediastinal contours with enlarged heart size. Status post median sternotomy. The left pleural effusion is decreased from prior. Consolidation at the left lung base could reflect atelectasis, infiltrate not excluded. The right lung is clear. No pneumothorax. Stable appearance of a right central venous catheter. No acute finding in the visualized skeleton. IMPRESSION: 1. Consolidative opacity at the left lung base could reflect atelectasis, infiltrate, or a combination. 2. No definite pleural effusion. Electronically Signed   By: Audie Pinto M.D.   On: 05/13/2019 10:48   ECHOCARDIOGRAM COMPLETE BUBBLE STUDY  Result Date: 05/22/2019   ECHOCARDIOGRAM REPORT   Patient Name:   EVANN KOELZER Date of Exam: 05/22/2019 Medical Rec #:  [id number]          Height:       65.0 in Accession #:    [id number]         Weight:       125.0 lb Date of Birth:  09-04-1950           BSA:          1.62 m Patient Age:    76 years           BP:           159/82 mmHg Patient Gender: F                  HR:           82 bpm. Exam Location:  Inpatient Procedure: 2D Echo and Saline Contrast Bubble Study  Indications:    Stroke I63.9  History:        Patient has prior history of Echocardiogram examinations, most                 recent 04/04/2019. Thoracic Aortic Aneurysm Repair; Risk                 Factors:Hypertension.  Sonographer:    Mikki Santee RDCS (AE) Referring Phys: [id number] Lanice Schwab AROOR IMPRESSIONS  1. Left ventricular ejection fraction, by visual estimation, is 65 to 70%. The left ventricle has hyperdynamic function. There is mildly increased left ventricular hypertrophy.  2. Left ventricular diastolic parameters are consistent with Grade II diastolic dysfunction (pseudonormalization).  3. The left ventricle has no regional wall motion abnormalities.  4. Global right ventricle has  normal systolic function.The right ventricular size is normal. No increase in right ventricular wall thickness.  5. Left atrial size was normal.  6. Right atrial size was mildly dilated.  7. The mitral valve is normal in structure. Trivial mitral valve regurgitation. No evidence of mitral stenosis.  8. The tricuspid valve is normal in structure. Tricuspid valve regurgitation is severe.  9. The aortic valve is normal in structure. Aortic valve regurgitation is not visualized. No evidence of aortic valve sclerosis or stenosis. 10. The pulmonic valve was normal in structure. Pulmonic valve regurgitation is not visualized. 11. Moderately elevated pulmonary artery systolic pressure. 12. The tricuspid regurgitant velocity is 3.22 m/s, and with an assumed right atrial pressure of 3 mmHg, the estimated right ventricular systolic pressure is moderately elevated at 44.3 mmHg. 13. The inferior vena cava is normal in size with greater than 50% respiratory variability, suggesting right atrial pressure of 3 mmHg. 14. No intracardiac thrombi or masses were visualized. FINDINGS  Left Ventricle: Left ventricular ejection fraction, by visual estimation, is 65 to 70%. The left ventricle has hyperdynamic function. The left ventricle has no  regional wall motion abnormalities. There is mildly increased left ventricular hypertrophy. Left ventricular diastolic parameters are consistent with Grade II diastolic dysfunction (pseudonormalization). Normal left atrial pressure. Right Ventricle: The right ventricular size is normal. No increase in right ventricular wall thickness. Global RV systolic function is has normal systolic function. The tricuspid regurgitant velocity is 3.22 m/s, and with an assumed right atrial pressure  of 3 mmHg, the estimated right ventricular systolic pressure is moderately elevated at 44.3 mmHg. Left Atrium: Left atrial size was normal in size. Right Atrium: Right atrial size was mildly dilated Pericardium: There is no evidence of pericardial effusion. Mitral Valve: The mitral valve is normal in structure. Trivial mitral valve regurgitation. No evidence of mitral valve stenosis by observation. Tricuspid Valve: The tricuspid valve is normal in structure. Tricuspid valve regurgitation is severe. Aortic Valve: The aortic valve is normal in structure. Aortic valve regurgitation is not visualized. The aortic valve is structurally normal, with no evidence of sclerosis or stenosis. Pulmonic Valve: The pulmonic valve was normal in structure. Pulmonic valve regurgitation is not visualized. Pulmonic regurgitation is not visualized. Aorta: The aortic root, ascending aorta and aortic arch are all structurally normal, with no evidence of dilitation or obstruction. Venous: The inferior vena cava is normal in size with greater than 50% respiratory variability, suggesting right atrial pressure of 3 mmHg. IAS/Shunts: No atrial level shunt detected by color flow Doppler. Agitated saline contrast was given intravenously to evaluate for intracardiac shunting. Saline contrast bubble study was negative, with no evidence of any interatrial shunt. There is no evidence of a patent foramen ovale. No ventricular septal defect is seen or detected. There is no  evidence of an atrial septal defect. Additional Comments: No intracardiac thrombi or masses were visualized.  LEFT VENTRICLE PLAX 2D LVIDd:         4.20 cm  Diastology LVIDs:         2.50 cm  LV e' lateral:   5.11 cm/s LV PW:         1.30 cm  LV E/e' lateral: 28.2 LV IVS:        1.00 cm  LV e' medial:    6.64 cm/s LVOT diam:     2.00 cm  LV E/e' medial:  21.7 LV SV:         56 ml LV SV Index:   34.91  LVOT Area:     3.14 cm  RIGHT VENTRICLE RV S prime:     6.57 cm/s TAPSE (M-mode): 0.8 cm LEFT ATRIUM           Index       RIGHT ATRIUM           Index LA diam:      2.50 cm 1.54 cm/m  RA Area:     13.90 cm LA Vol (A2C): 22.9 ml 14.14 ml/m RA Volume:   30.10 ml  18.58 ml/m LA Vol (A4C): 27.8 ml 17.16 ml/m  AORTIC VALVE LVOT Vmax:   126.00 cm/s LVOT Vmean:  71.500 cm/s LVOT VTI:    0.232 m  AORTA Ao Root diam: 2.70 cm MITRAL VALVE                         TRICUSPID VALVE MV Area (PHT): 3.63 cm              TR Peak grad:   41.3 mmHg MV PHT:        60.61 msec            TR Vmax:        344.00 cm/s MV Decel Time: 209 msec MV E velocity: 144.00 cm/s 103 cm/s  SHUNTS MV A velocity: 116.00 cm/s 70.3 cm/s Systemic VTI:  0.23 m MV E/A ratio:  1.24        1.5       Systemic Diam: 2.00 cm  Candee Furbish MD Electronically signed by Candee Furbish MD Signature Date/Time: 05/22/2019/11:07:03 AM    Final    VAS US CAROTID  Result Date: 05/22/2019 Carotid Arterial Duplex Study Indications:       CVA. Risk Factors:      Hypertension. Comparison Study:  no prior Performing Technologist: Abram Sander RVS  Examination Guidelines: A complete evaluation includes B-mode imaging, spectral Doppler, color Doppler, and power Doppler as needed of all accessible portions of each vessel. Bilateral testing is considered an integral part of a complete examination. Limited examinations for reoccurring indications may be performed as noted.  Right Carotid Findings: +----------+--------+--------+--------+------------------+--------+           PSV  cm/sEDV cm/sStenosisPlaque DescriptionComments +----------+--------+--------+--------+------------------+--------+ CCA Prox  63      15                                         +----------+--------+--------+--------+------------------+--------+ CCA Distal48      20                                         +----------+--------+--------+--------+------------------+--------+ ICA Prox  104     42      40-59%                    tortuous +----------+--------+--------+--------+------------------+--------+ ICA Mid   90      31                                         +----------+--------+--------+--------+------------------+--------+ ICA Distal151     62                                         +----------+--------+--------+--------+------------------+--------+  ECA                       Occluded                           +----------+--------+--------+--------+------------------+--------+ +----------+--------+-------+--------+-------------------+           PSV cm/sEDV cmsDescribeArm Pressure (mmHG) +----------+--------+-------+--------+-------------------+ [id number]                                         +----------+--------+-------+--------+-------------------+ +---------+--------+--+--------+--+---------+ VertebralPSV cm/s65EDV cm/s18Antegrade +---------+--------+--+--------+--+---------+  Left Carotid Findings: +----------+--------+--------+--------+------------------+--------------+           PSV cm/sEDV cm/sStenosisPlaque DescriptionComments       +----------+--------+--------+--------+------------------+--------------+ CCA Prox  76      20                                               +----------+--------+--------+--------+------------------+--------------+ CCA Distal57      15                                               +----------+--------+--------+--------+------------------+--------------+ ICA Prox  136     43      40-59%                     tortuous       +----------+--------+--------+--------+------------------+--------------+ ICA Mid   66      25                                               +----------+--------+--------+--------+------------------+--------------+ ICA Distal98      34                                               +----------+--------+--------+--------+------------------+--------------+ ECA       42                                        near occlusion +----------+--------+--------+--------+------------------+--------------+ +---------+--------+--+--------+--+ VertebralPSV cm/s47EDV cm/s15 +---------+--------+--+--------+--+  Summary: Right Carotid: Velocities in the right ICA are consistent with a 40-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 The ECA appears occluded. Left Carotid: Velocities in the left ICA are consistent with a 40-59% stenosis. Vertebrals: Bilateral vertebral arteries demonstrate antegrade flow. *See table(s) above for measurements and observations.  Electronically signed by Antony Contras MD on 05/22/2019 at 12:50:21 PM.    Final    IR THORACENTESIS ASP PLEURAL SPACE W/IMG GUIDE  Result Date: 05/19/2019 INDICATION: Shortness of breath. Left-sided pleural effusion. Request for diagnostic therapeutic thoracentesis. EXAM: ULTRASOUND GUIDED LEFT THORACENTESIS MEDICATIONS: None. COMPLICATIONS: None immediate. PROCEDURE: An ultrasound guided thoracentesis was thoroughly discussed with the patient and questions answered. The benefits, risks, alternatives and complications were also discussed.  The patient understands and wishes to proceed with the procedure. Written consent was obtained. Ultrasound was performed to localize and mark an adequate pocket of fluid in the left chest. The area was then prepped and draped in the normal sterile fashion. 1% Lidocaine was used for local anesthesia. Under ultrasound guidance a 6 Fr Safe-T-Centesis catheter was introduced. Thoracentesis was  performed. The catheter was removed and a dressing applied. FINDINGS: A total of approximately 320 mL of serosanguineous fluid was removed. Samples were sent to the laboratory as requested by the clinical team. IMPRESSION: Successful ultrasound guided left thoracentesis yielding 320 mL of pleural fluid. Read by: Ascencion Dike PA-C Electronically Signed   By: Aletta Edouard M.D.   On: 05/19/2019 09:13       Discharge Exam: Vitals:   05/24/19 1155 05/24/19 1200  BP:  (!) 186/95  Pulse: 87 84  Resp: 13 17  Temp:    SpO2: 100% 100%     General: Pt is alert, awake, not in acute distress Cardiovascular: RRR, S1/S2 +, no edema Respiratory: CTA bilaterally, no wheezing, no rhonchi, no respiratory distress, no conversational dyspnea  Abdominal: Soft, NT, ND, bowel sounds + Extremities: no edema, no cyanosis Psych: Normal mood and affect, stable judgement and insight     The results of significant diagnostics from this hospitalization (including imaging, microbiology, ancillary and laboratory) are listed below for reference.     Microbiology: Recent Results (from the past 240 hour(s))  SARS CORONAVIRUS 2 (TAT 6-24 HRS) Nasopharyngeal Nasopharyngeal Swab     Status: None   Collection Time: 05/18/19 11:56 AM   Specimen: Nasopharyngeal Swab  Result Value Ref Range Status   SARS Coronavirus 2 NEGATIVE NEGATIVE Final    Comment: (NOTE) SARS-CoV-2 target nucleic acids are NOT DETECTED. The SARS-CoV-2 RNA is generally detectable in upper and lower respiratory specimens during the acute phase of infection. Negative results do not preclude SARS-CoV-2 infection, do not rule out co-infections with other pathogens, and should not be used as the sole basis for treatment or other patient management decisions. Negative results must be combined with clinical observations, patient history, and epidemiological information. The expected result is Negative. Fact Sheet for  Patients: SugarRoll.be Fact Sheet for Healthcare Providers: https://www.woods-mathews.com/ This test is not yet approved or cleared by the [place] FDA and  has been authorized for detection and/or diagnosis of SARS-CoV-2 by FDA under an Emergency Use Authorization (EUA). This EUA will remain  in effect (meaning this test can be used) for the duration of the COVID-19 declaration under Section 56 4(b)(1) of the Act, 21 U.S.C. section 360bbb-3(b)(1), unless the authorization is terminated or revoked sooner. Performed at [facility], [place] [address] [place], [place] [address]   Body fluid culture     Status: None   Collection Time: 05/19/19  9:33 AM   Specimen: Lung, Left; Pleural Fluid  Result Value Ref Range Status   Specimen Description PLEURAL LEFT  Final   Special Requests NONE  Final   Gram Stain   Final    RARE WBC PRESENT, PREDOMINANTLY MONONUCLEAR NO ORGANISMS SEEN    Culture   Final    NO GROWTH 3 DAYS Performed at [facility], [place] [address] [place], [place] [address]    Report Status 05/22/2019 FINAL  Final  MRSA PCR Screening     Status: None   Collection Time: 05/20/19  6:39 AM   Specimen: Nasal Mucosa; Nasopharyngeal  Result Value Ref Range Status  MRSA by PCR NEGATIVE NEGATIVE Final    Comment:        The GeneXpert MRSA Assay (FDA approved for NASAL specimens only), is one component of a comprehensive MRSA colonization surveillance program. It is not intended to diagnose MRSA infection nor to guide or monitor treatment for MRSA infections. Performed at [facility], [place] [address] [place], [place] [address]      Labs: BNP (last 3 results) No results for input(s): BNP in the last 8760 hours. Basic Metabolic Panel: Recent Labs  Lab 05/20/19 0243 05/21/19 0430 05/22/19 0520 05/23/19 0449 05/24/19 0409  NA 132* 136 135 136 134*  K 4.5 4.0 4.4 4.5 4.0  CL 96* 97* 96* 96* 96*  CO2  24 28 25 25  21*  GLUCOSE 166* 109* 108* 119* 110*  BUN 21 18 34* 41* 18  CREATININE 6.53* 4.02* 6.11* 6.76* 4.16*  CALCIUM 8.8* 8.3* 8.9 9.4 9.5  PHOS  --  3.8  --   --   --    Liver Function Tests: Recent Labs  Lab 05/18/19 1152 05/20/19 0243  AST 14* 11*  ALT 8 8  ALKPHOS 73 66  BILITOT 1.0 0.3  PROT 7.8 6.2*  ALBUMIN 2.6* 2.2*   No results for input(s): LIPASE, AMYLASE in the last 168 hours. Recent Labs  Lab 05/20/19 0243  AMMONIA 22   CBC: Recent Labs  Lab 05/19/19 0423 05/20/19 0243 05/21/19 0430 05/23/19 0449 05/24/19 0409  WBC 10.4 9.8 9.7 11.2* 11.4*  HGB 9.5* 8.6* 9.0* 9.5* 9.7*  HCT 30.9* 28.9* 29.8* 30.6* 30.8*  MCV 91.7 94.1 92.0 88.7 87.5  PLT 629* 587* 623* 712* 641*   Cardiac Enzymes: No results for input(s): CKTOTAL, CKMB, CKMBINDEX, TROPONINI in the last 168 hours. BNP: Invalid input(s): POCBNP CBG: Recent Labs  Lab 05/19/19 2139 05/21/19 1116 05/21/19 1627  GLUCAP 123* 139* 136*   D-Dimer No results for input(s): DDIMER in the last 72 hours. Hgb A1c No results for input(s): HGBA1C in the last 72 hours. Lipid Profile Recent Labs    05/22/19 1700  CHOL 124  HDL 42  LDLCALC 56  TRIG 132  CHOLHDL 3.0   Thyroid function studies No results for input(s): TSH, T4TOTAL, T3FREE, THYROIDAB in the last 72 hours.  Invalid input(s): FREET3 Anemia work up No results for input(s): VITAMINB12, FOLATE, FERRITIN, TIBC, IRON, RETICCTPCT in the last 72 hours. Urinalysis    Component Value Date/Time   COLORURINE AMBER (A) 04/16/2019 0921   APPEARANCEUR CLOUDY (A) 04/16/2019 0921   LABSPEC 1.020 04/16/2019 0921   PHURINE 6.0 04/16/2019 0921   GLUCOSEU NEGATIVE 04/16/2019 0921   GLUCOSEU NEG mg/dL 10/30/2009 1944   HGBUR NEGATIVE 04/16/2019 0921   BILIRUBINUR NEGATIVE 04/16/2019 0921   BILIRUBINUR negative 05/04/2018 0910   KETONESUR NEGATIVE 04/16/2019 0921   PROTEINUR 100 (A) 04/16/2019 0921   UROBILINOGEN 0.2 05/04/2018 0910    UROBILINOGEN 1 02/14/2014 0946   NITRITE NEGATIVE 04/16/2019 0921   LEUKOCYTESUR TRACE (A) 04/16/2019 0921   Sepsis Labs Invalid input(s): PROCALCITONIN,  WBC,  LACTICIDVEN Microbiology Recent Results (from the past 240 hour(s))  SARS CORONAVIRUS 2 (TAT 6-24 HRS) Nasopharyngeal Nasopharyngeal Swab     Status: None   Collection Time: 05/18/19 11:56 AM   Specimen: Nasopharyngeal Swab  Result Value Ref Range Status   SARS Coronavirus 2 NEGATIVE NEGATIVE Final    Comment: (NOTE) SARS-CoV-2 target nucleic acids are NOT DETECTED. The SARS-CoV-2 RNA is generally detectable in upper and lower respiratory  specimens during the acute phase of infection. Negative results do not preclude SARS-CoV-2 infection, do not rule out co-infections with other pathogens, and should not be used as the sole basis for treatment or other patient management decisions. Negative results must be combined with clinical observations, patient history, and epidemiological information. The expected result is Negative. Fact Sheet for Patients: SugarRoll.be Fact Sheet for Healthcare Providers: https://www.woods-mathews.com/ This test is not yet approved or cleared by the [place] FDA and  has been authorized for detection and/or diagnosis of SARS-CoV-2 by FDA under an Emergency Use Authorization (EUA). This EUA will remain  in effect (meaning this test can be used) for the duration of the COVID-19 declaration under Section 56 4(b)(1) of the Act, 21 U.S.C. section 360bbb-3(b)(1), unless the authorization is terminated or revoked sooner. Performed at [facility], [place] [address] [place], [place] [address]   Body fluid culture     Status: None   Collection Time: 05/19/19  9:33 AM   Specimen: Lung, Left; Pleural Fluid  Result Value Ref Range Status   Specimen Description PLEURAL LEFT  Final   Special Requests NONE  Final   Gram Stain   Final    RARE WBC PRESENT,  PREDOMINANTLY MONONUCLEAR NO ORGANISMS SEEN    Culture   Final    NO GROWTH 3 DAYS Performed at [facility], [place] [address] [place], [place] [address]    Report Status 05/22/2019 FINAL  Final  MRSA PCR Screening     Status: None   Collection Time: 05/20/19  6:39 AM   Specimen: Nasal Mucosa; Nasopharyngeal  Result Value Ref Range Status   MRSA by PCR NEGATIVE NEGATIVE Final    Comment:        The GeneXpert MRSA Assay (FDA approved for NASAL specimens only), is one component of a comprehensive MRSA colonization surveillance program. It is not intended to diagnose MRSA infection nor to guide or monitor treatment for MRSA infections. Performed at [facility], [place] [address] [place], [place] [address]      Patient was seen and examined on the day of discharge and was found to be in stable condition. Time coordinating discharge: 45 minutes including assessment and coordination of care, as well as examination of the patient.   SIGNED:  Little Ishikawa, DO [facility] 05/24/2019, 1:00 PM

## 2019-05-24 NOTE — Progress Notes (Signed)
BP 222/126. One time ordered dose given. On call for [facility] paged to make aware. Scheduled dose given now. RN will continue to monitor.

## 2019-05-24 NOTE — Progress Notes (Signed)
PT. BP continues to be elevated. PRN given and ineffective. On call for [facility] paged to make aware.

## 2019-05-24 NOTE — Progress Notes (Signed)
Nutrition Follow-up  DOCUMENTATION CODES:   Non-severe (moderate) malnutrition in context of chronic illness  INTERVENTION:   -Continue 30 ml Prostat BID, each supplement provides 100 kcals and 15 grams protein -Continue Boost Breeze po BID, each supplement provides 250 kcal and 9 grams of protein -Continue Magic cup BID with meals, each supplement provides 290 kcal and 9 grams of protein -Continue renal MVI daily  NUTRITION DIAGNOSIS:   Moderate Malnutrition related to chronic illness(ESRD on HD) as evidenced by percent weight loss, mild fat depletion, moderate fat depletion, mild muscle depletion, moderate muscle depletion.  Ongoing  GOAL:   Patient will meet greater than or equal to 90% of their needs  Progressing   MONITOR:   PO intake, Supplement acceptance, Labs, Weight trends, Skin, I & O's  REASON FOR ASSESSMENT:   Malnutrition Screening Tool    ASSESSMENT:   Monique Henderson is an 68 y.o. 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  She was referred from the outpatient hemodialysis center to the emergency room because of complaints of shortness of breath.  She said she has been feeling short of breath and having chest pain since she had her heart surgery on 04/04/2019.  Chest pain is usually across her chest and this is nothing new since her surgery.  However, shortness of breath has progressively worsened.  Shortness of breath is worsened by mild exertion while talking.  Today, she went for hemodialysis and when she complained of shortness of breath they referred her to the emergency room.  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 She reports poor appetite, but states nausea and vomiting have resolved from yesterday. Intake has been variable over the past 5 days- noted documented meal completion ranging from 10-100%. Observed breakfast meal tray- pt consumed a few bites of Magic Cup and 25% of applesauce. Pt reports to this RD "I'll eat when I go home! My sister is picking me up at 1:00 today". RD discussed importance of good meal intake to help support recovery post hospitalization.   Labs reviewed: Na: 134.   Diet Order:   Diet Order            Diet renal with fluid restriction Fluid restriction: 1200 mL Fluid; Room service appropriate? Yes; Fluid consistency: Thin  Diet effective now              EDUCATION NEEDS:   Education needs have been addressed  Skin:  Skin Assessment: Reviewed RN Assessment  Last BM:  05/22/19  Height:   Ht Readings from Last 1 Encounters:  05/18/19 5\' 3"  (1.6 m)    Weight:   Wt Readings from Last 1 Encounters:  05/24/19 45.7 kg    Ideal Body Weight:  52.3 kg  BMI:  Body mass index is 17.84 kg/m.  Estimated Nutritional Needs:   Kcal:  1500-1700  Protein:  80-95 grams  Fluid:  1.2 L    Manvi Guilliams A. Jimmye Norman, RD, LDN, [place] Registered Dietitian II Certified Diabetes Care and Education Specialist Pager: [phone] After  hours Pager: [phone]

## 2019-05-24 NOTE — Progress Notes (Signed)
[facility] ROUNDING NOTE   Subjective:   This is a 68 year old lady with a history of  repair of ascending thoracic aortic aneurysm 04/04/2019 Dr. Orvan Seen.  She also has a history of hypertension, tobacco abuse, breast cancer atrial fibrillation end-stage renal disease Tuesday Thursday Saturday, she has hepatitis C lumbar spinal stenosis.  She was sent from the outpatient unit with complaints of shortness of breath and chest pain.  CT scan of chest in the emergency department was negative for pulmonary embolus but revealed a large left-sided pleural effusion.  Cardiothoracic surgery was consulted and they recommended thoracentesis with removal of 320 cc of serosanguineous fluid.  Hospital course was complicated by an episode of lethargy and altered mental status with subsequent CT scan of the head and MRI that showed a small acute left parietal infarct.  This appears to have resolved  Blood pressure 160/616 pulse 90 temperature 99.6  Sodium 134 potassium 4 chloride 96 CO2 21 BUN 18 creatinine 4.16 glucose 110 calcium 9.5 phosphorus 3.8 WBC 11.4 hemoglobin 9.7 platelets 640  Ultrafiltered 1 L 05/23/2019   Aspirin 325 mg daily, hydralazine 25 mg every 8 hours, metoprolol 50 mg twice daily, Hectorol 1 mcg Tuesday Thursday Saturday  Objective:  Vital signs in last 24 hours:  Temp:  [98.2 F (36.8 C)-99.7 F (37.6 C)] 99.7 F (37.6 C) (12/23 0817) Pulse Rate:  [85-108] 90 (12/23 1001) Resp:  [0-31] 18 (12/23 1001) BP: (160-222)/(79-126) 160/116 (12/23 1001) SpO2:  [59 %-100 %] 99 % (12/23 1001) Weight:  [45.7 kg-47 kg] 45.7 kg (12/23 0522)  Weight change: 0.1 kg Filed Weights   05/23/19 0900 05/23/19 1308 05/24/19 0522  Weight: 48 kg 47 kg 45.7 kg    Intake/Output: I/O last 3 completed shifts: In: 720 [P.O.:720] Out: 1150 [Urine:150; Other:1000]   Intake/Output this shift:  Total I/O In: 120 [P.O.:120] Out: -  Pleasant lady nondistressed CVS- RRR no murmurs rubs  or gallops RS- CTA no wheeze or rales ABD- BS present soft non-distended EXT- no edema left AV fistula maturing [facility]   Basic Metabolic Panel: Recent Labs  Lab 05/20/19 0243 05/21/19 0430 05/22/19 0520 05/23/19 0449 05/24/19 0409  NA 132* 136 135 136 134*  K 4.5 4.0 4.4 4.5 4.0  CL 96* 97* 96* 96* 96*  CO2 24 28 25 25  21*  GLUCOSE 166* 109* 108* 119* 110*  BUN 21 18 34* 41* 18  CREATININE 6.53* 4.02* 6.11* 6.76* 4.16*  CALCIUM 8.8* 8.3* 8.9 9.4 9.5  PHOS  --  3.8  --   --   --     Liver Function Tests: Recent Labs  Lab 05/18/19 1152 05/20/19 0243  AST 14* 11*  ALT 8 8  ALKPHOS 73 66  BILITOT 1.0 0.3  PROT 7.8 6.2*  ALBUMIN 2.6* 2.2*   No results for input(s): LIPASE, AMYLASE in the last 168 hours. Recent Labs  Lab 05/20/19 0243  AMMONIA 22    CBC: Recent Labs  Lab 05/19/19 0423 05/20/19 0243 05/21/19 0430 05/23/19 0449 05/24/19 0409  WBC 10.4 9.8 9.7 11.2* 11.4*  HGB 9.5* 8.6* 9.0* 9.5* 9.7*  HCT 30.9* 28.9* 29.8* 30.6* 30.8*  MCV 91.7 94.1 92.0 88.7 87.5  PLT 629* 587* 623* 712* 641*    Cardiac Enzymes: No results for input(s): CKTOTAL, CKMB, CKMBINDEX, TROPONINI in the last 168 hours.  BNP: Invalid input(s): POCBNP  CBG: Recent Labs  Lab 05/19/19 2139 05/21/19 1116 05/21/19 1627  GLUCAP 123* 139* 136*    Microbiology:  Results for orders placed or performed during the hospital encounter of 05/18/19  SARS CORONAVIRUS 2 (TAT 6-24 HRS) Nasopharyngeal Nasopharyngeal Swab     Status: None   Collection Time: 05/18/19 11:56 AM   Specimen: Nasopharyngeal Swab  Result Value Ref Range Status   SARS Coronavirus 2 NEGATIVE NEGATIVE Final    Comment: (NOTE) SARS-CoV-2 target nucleic acids are NOT DETECTED. The SARS-CoV-2 RNA is generally detectable in upper and lower respiratory specimens during the acute phase of infection. Negative results do not preclude SARS-CoV-2 infection, do not rule out co-infections with other pathogens, and should not  be used as the sole basis for treatment or other patient management decisions. Negative results must be combined with clinical observations, patient history, and epidemiological information. The expected result is Negative. Fact Sheet for Patients: SugarRoll.be Fact Sheet for Healthcare Providers: https://www.woods-mathews.com/ This test is not yet approved or cleared by the [place] FDA and  has been authorized for detection and/or diagnosis of SARS-CoV-2 by FDA under an Emergency Use Authorization (EUA). This EUA will remain  in effect (meaning this test can be used) for the duration of the COVID-19 declaration under Section 56 4(b)(1) of the Act, 21 U.S.C. section 360bbb-3(b)(1), unless the authorization is terminated or revoked sooner. Performed at [facility], [place] [address] [place], [place] [address]   Body fluid culture     Status: None   Collection Time: 05/19/19  9:33 AM   Specimen: Lung, Left; Pleural Fluid  Result Value Ref Range Status   Specimen Description PLEURAL LEFT  Final   Special Requests NONE  Final   Gram Stain   Final    RARE WBC PRESENT, PREDOMINANTLY MONONUCLEAR NO ORGANISMS SEEN    Culture   Final    NO GROWTH 3 DAYS Performed at [facility], [place] [address], [place], [place] [address]    Report Status 05/22/2019 FINAL  Final  MRSA PCR Screening     Status: None   Collection Time: 05/20/19  6:39 AM   Specimen: Nasal Mucosa; Nasopharyngeal  Result Value Ref Range Status   MRSA by PCR NEGATIVE NEGATIVE Final    Comment:        The GeneXpert MRSA Assay (FDA approved for NASAL specimens only), is one component of a comprehensive MRSA colonization surveillance program. It is not intended to diagnose MRSA infection nor to guide or monitor treatment for MRSA infections. Performed at [facility], [place] [address], [place], [place] [address]     Coagulation Studies: Recent Labs     05/23/19 0449 05/24/19 0409  LABPROT 14.7 14.2  INR 1.2 1.1    Urinalysis: No results for input(s): COLORURINE, LABSPEC, PHURINE, GLUCOSEU, HGBUR, BILIRUBINUR, KETONESUR, PROTEINUR, UROBILINOGEN, NITRITE, LEUKOCYTESUR in the last 72 hours.  Invalid input(s): APPERANCEUR    Imaging: No results found.   Medications:   . sodium chloride 1,000 mL (05/20/19 0029)   . aspirin EC  325 mg Oral Daily  . Chlorhexidine Gluconate Cloth  6 each Topical Q0600  . Chlorhexidine Gluconate Cloth  6 each Topical Q0600  . cloNIDine  0.3 mg Transdermal Q Wed  . doxercalciferol  1 mcg Intravenous Q T,Th,Sa-HD  . famotidine  10 mg Oral QHS  . feeding supplement  1 Container Oral BID BM  . feeding supplement (PRO-STAT SUGAR FREE 64)  30 mL Oral BID BM  . heparin  5,000 Units Subcutaneous Q8H  . hydrALAZINE  25 mg Oral Q8H  . metoprolol tartrate  50 mg Oral BID  . multivitamin  1 tablet Oral QHS  . warfarin  2.5 mg Oral ONCE-1800  . Warfarin - Pharmacist Dosing Inpatient   Does not apply q1800   sodium chloride, albuterol, alum & mag hydroxide-simeth, hydrALAZINE, ondansetron **OR** ondansetron (ZOFRAN) IV, oxyCODONE, polyethylene glycol, promethazine  Assessment/ Plan:  1. SOB 2/2 pleural effusion - s/p thoracentesis 05/19/2019.  320 cc removed Improved.   Interestingly shows serosanguineous fluid suggestive of exudate cultures no growth 2. Altered mental status - Improved today.   MRI small acute left parietal white matter infarct moderate to severe chronic small vessel disease echo and carotid Dopplers pending neurology following.  CT no acute intracranial abnormalities ammonia level 22 EEG background slowing.  Appears to have resolved. 3. End-stage renal disease Tuesday Thursday Saturday dialysis.-tolerated dialysis 05/23/2019 with 1 L removed.  Next dialysis treatment will be 05/25/2019 4. Hypertension/volume - BP variable, mostly well controlled, on metoprolol 50mg  BID and hydralazine 25mg   TID.  We will add amlodipine 10 mg daily.    We will continue dialysis outpatient schedule which will be Tuesday Thursday Saturday 5. Anemia of CKD - Hgb 9.0. Recently dosed with ESA. Continue to follow.  6. Secondary Hyperparathyroidism - Ca in goal. Will check phos. Continue VDRA. Not on binders.  7. Nutrition - Renal diet w/fluid restrictions 8. 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 Orvan Seen 9. Disposition patient stable for discharge from renal standpoint discussed with primary service will discharge after dialysis treatment    LOS: [place] @TODAY @10 :40 AM

## 2019-05-25 ENCOUNTER — Telehealth: Payer: Self-pay | Admitting: Nephrology

## 2019-05-25 NOTE — Telephone Encounter (Signed)
Transition of Care Contact from [place]   Date of Discharge: 05/24/2019 Date of Contact: 05/25/2019 Method of contact: phone Talked to patient and sister   Patient contacted to discuss transition of care form recent hospitaliztion. Patient was admitted to [facility] from 12/17 to 12/23 with the discharge diagnosis of left sided pleural effusion s/p thoracentesis.  Hospital stay complicated by acute left parietal CVA.     Medication changes were reviewed including changes: amiodarone, oxycodone, pregabalin and tramadol.   Patient will follow up with is outpatient dialysis center on Sunday 05/28/19.  Other follow up needs include patient not taking warfarin that was listed on d/c summary.  Advised to call prescriber.    Jen Mow, PA-C [facility] Pager: [phone]

## 2019-05-29 ENCOUNTER — Telehealth (INDEPENDENT_AMBULATORY_CARE_PROVIDER_SITE_OTHER): Payer: Medicare Other | Admitting: Family Medicine

## 2019-05-29 ENCOUNTER — Encounter: Payer: Self-pay | Admitting: Family Medicine

## 2019-05-29 ENCOUNTER — Other Ambulatory Visit: Payer: Self-pay

## 2019-05-29 VITALS — Ht 63.0 in | Wt 100.0 lb

## 2019-05-29 DIAGNOSIS — Z8673 Personal history of transient ischemic attack (TIA), and cerebral infarction without residual deficits: Secondary | ICD-10-CM

## 2019-05-29 DIAGNOSIS — N186 End stage renal disease: Secondary | ICD-10-CM | POA: Diagnosis not present

## 2019-05-29 DIAGNOSIS — I4891 Unspecified atrial fibrillation: Secondary | ICD-10-CM | POA: Diagnosis not present

## 2019-05-29 DIAGNOSIS — Z8679 Personal history of other diseases of the circulatory system: Secondary | ICD-10-CM

## 2019-05-29 DIAGNOSIS — E440 Moderate protein-calorie malnutrition: Secondary | ICD-10-CM

## 2019-05-29 DIAGNOSIS — I480 Paroxysmal atrial fibrillation: Secondary | ICD-10-CM

## 2019-05-29 DIAGNOSIS — Z9889 Other specified postprocedural states: Secondary | ICD-10-CM

## 2019-05-29 DIAGNOSIS — Z992 Dependence on renal dialysis: Secondary | ICD-10-CM | POA: Insufficient documentation

## 2019-05-29 DIAGNOSIS — Z7901 Long term (current) use of anticoagulants: Secondary | ICD-10-CM

## 2019-05-29 HISTORY — DX: Personal history of transient ischemic attack (TIA), and cerebral infarction without residual deficits: Z86.73

## 2019-05-29 HISTORY — DX: Dependence on renal dialysis: N18.6

## 2019-05-29 HISTORY — DX: Paroxysmal atrial fibrillation: I48.0

## 2019-05-29 HISTORY — DX: End stage renal disease: N18.6

## 2019-05-29 MED ORDER — MIRTAZAPINE 7.5 MG PO TABS: 7.5000 mg | ORAL_TABLET | Freq: Every day | ORAL | 2 refills | Status: DC

## 2019-05-29 NOTE — Progress Notes (Signed)
Hospital f/u   Talk about dialysis wants to get off of it. Hospital put her on it along with Open heart surgery.

## 2019-05-29 NOTE — Patient Instructions (Signed)
° ° ° °  If you have lab work done today you will be contacted with your lab results within the next 2 weeks.  If you have not heard from us then please contact us. The fastest way to get your results is to register for My Chart. ° ° °IF you received an x-ray today, you will receive an invoice from [facility]. Please contact [facility] at [phone] with questions or concerns regarding your invoice.  ° °IF you received labwork today, you will receive an invoice from [facility]. Please contact [facility] at [phone] with questions or concerns regarding your invoice.  ° °Our billing staff will not be able to assist you with questions regarding bills from these companies. ° °You will be contacted with the lab results as soon as they are available. The fastest way to get your results is to activate your My Chart account. Instructions are located on the last page of this paperwork. If you have not heard from us regarding the results in 2 weeks, please contact this office. °  ° ° ° °

## 2019-05-29 NOTE — Progress Notes (Signed)
Virtual Visit Note  I connected with patient on 05/29/19 at 507pm by phone and verified that I am speaking with the correct person using two identifiers. Monique Henderson is currently located at home and patient is currently with them during visit. The provider, [name], MD is located in their office at time of visit.  I discussed the limitations, risks, security and privacy concerns of performing an evaluation and management service by telephone and the availability of in person appointments. I also discussed with the patient that there may be a patient responsible charge related to this service. The patient expressed understanding and agreed to proceed.   CC: hosp followup  HPI ? PMH:HTN, CKD4, GERD, chronic low back pain s/p lumbar fusion, bilateral breast cancer s/p radiation, HCV s/p treatment/cure  Last OV Sept 2020 Since then patient has had multiple hosp/ed visits - chart reviewed  Nov 2-22nd - hosp. aortic dissection which was repaired, complicated with post delirium, afib - started on amiodarone, started on HD, had L AV fistula placed on 11/19, cardiac arrest on nov 5th  Dec 12 - er. SOB, small left pleural effusions, no interventions, fu with ct surg in 2 weeks  Dec 17-23rd - hosp. Worsening SOB and left pleural effusion s/p thoracentesis, neg culture, had AMS - metabolic encephalopathy, left parietal CVA, started on coumadin given afib. Amiodarone changed to metoprolol  crt 4.16, gfr 12 hgb 9.7  Patient reports that she is not doing well Feeling tired, not eating well, has lost weight Has not been able to see kidney doctor in clinic and does not recall have seen them in dialysis Bothered by access in her neck She wants to get off dialysis Has not talked with anyone about her symptoms Goes T/Th/Sat, [facility] in [place] clinic checks her INR She has upcoming appt with ct surg, cards and neurology She denies any sequela from her CVA Denies any  chest pain, SOB, cough, palpitations, edema, orthopnea, PND, abnormal bleeding  Wt Readings from Last 3 Encounters:  05/29/19 100 lb (45.4 kg)  05/24/19 100 lb 11.2 oz (45.7 kg)  05/13/19 110 lb 3.7 oz (50 kg)     Allergies  Allergen Reactions  . Shrimp [Shellfish Allergy] Shortness Of Breath  . Bactroban [Mupirocin] Other (See Comments)    "Sores in nose"  . Vicodin [Hydrocodone-Acetaminophen] Itching and Nausea And Vomiting    This is patient's home medication  . Lisinopril Cough  . Tylenol [Acetaminophen] Itching    Prior to Admission medications   Medication Sig Start Date End Date Taking? Authorizing Provider  albuterol (VENTOLIN HFA) 108 (90 Base) MCG/ACT inhaler Inhale 2 puffs into the lungs every 6 (six) hours as needed for wheezing or shortness of breath. 02/02/19  Yes [name], MD  amLODipine (NORVASC) 10 MG tablet TAKE 1 TABLET (10 MG TOTAL) BY MOUTH DAILY. 09/20/18  Yes [name], MD  aspirin EC 81 MG tablet Take 1 tablet (81 mg total) by mouth daily. Until INR > 2. Then stop and continue coumadin only. 05/23/19  Yes Dessa Phi, DO  cloNIDine (CATAPRES - DOSED IN MG/24 HR) 0.3 mg/24hr patch Place 1 patch (0.3 mg total) onto the skin once a week. 04/22/19  Yes Lars Pinks M, PA-C  famotidine (PEPCID) 10 MG tablet Take 1 tablet (10 mg total) by mouth at bedtime. 05/04/18  Yes [name], MD  hydrALAZINE (APRESOLINE) 25 MG tablet Take 1 tablet (25 mg total) by mouth every  8 (eight) hours. 04/21/19  Yes Lars Pinks M, PA-C  metoprolol tartrate (LOPRESSOR) 50 MG tablet Take 1 tablet (50 mg total) by mouth 2 (two) times daily. 04/21/19  Yes Lars Pinks M, PA-C  multivitamin (RENA-VIT) TABS tablet Take 1 tablet by mouth at bedtime. 04/21/19  Yes Tacy Dura, Donielle M, PA-C  ondansetron (ZOFRAN) 4 MG tablet Take 1 tablet (4 mg total) by mouth every 8 (eight) hours as needed for nausea or vomiting. 04/22/19  Yes Lars Pinks M, PA-C    warfarin (COUMADIN) 2.5 MG tablet Take 1 tablet (2.5 mg total) by mouth daily. 05/23/19 08/21/19 Yes Dessa Phi, DO    Past Medical History:  Diagnosis Date  . Atelectasis 2002   Bilateral  . Bone spur 2008   Right calcaneal foot spur  . Breast cancer ([place]) 2004   Ductal carcinoma in situ of the left breast; S/P left partial mastectomy 02/26/2003; S/P re-excision of cranial and lateral margins11/18/2004.radiation  . Breast cancer ([place]) 09/21/2012   right breast/ last radiation treatment 03/22/2013  . Chronic kidney disease, stage IV (severe) ([place]) 10/10/2007  . 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 Autumn Messing; S/P re-excision of superior and inferior margins 10/27/2012.   Marland Kitchen GERD (gastroesophageal reflux disease)   . 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 Hot flashes   . Hx of radiation therapy 2005   left breast  . Hx of radiation therapy 01/11/13- 03/22/13   right breast 5760 cGy 30 sessions  . Hypertension   . Low back pain   . Lumbar spinal stenosis    S/P lumbar decompressive laminectomy, fusion and plating for lumbar spinal stensosis  . Normocytic anemia    With thrombocytosis  . Osteoarthritis   . Personal history of radiation therapy   . Right ureteral stone 2002  . Shortness of breath    from pain  . Uterine fibroid   . Wears dentures    top    Past Surgical History:  Procedure Laterality Date  . ANTERIOR LAT LUMBAR FUSION N/A 01/18/2014   Procedure: ANTERIOR LATERAL LUMBAR FUSION LUMBAR TWO-THREE;  Surgeon: Eustace Moore, MD;  Location: [place] [facility];  Service: Neurosurgery;  Laterality: N/A;  . ANTERIOR LUMBAR FUSION  01/18/2014  . AV FISTULA PLACEMENT Left 04/20/2019   Procedure: ARTERIOVENOUS (AV) FISTULA CREATION;  Surgeon: [name], MD;  Location: [place];  Service: Vascular;  Laterality: Left;  . BACK SURGERY    . BREAST LUMPECTOMY Left 01/2003  . BREAST  LUMPECTOMY Right 2014  . BREAST LUMPECTOMY WITH NEEDLE LOCALIZATION AND AXILLARY SENTINEL LYMPH NODE BX Right 10/13/2012   Procedure: BREAST LUMPECTOMY WITH NEEDLE LOCALIZATION;  Surgeon: [name], MD;  Location: [place];  Service: General;  Laterality: Right;  Right breast wire localized lumpectomy  . INSERTION OF DIALYSIS CATHETER Right 04/20/2019   Procedure: INSERTION OF DIALYSIS CATHETER, right internal jugular;  Surgeon: [name], MD;  Location: [facility];  Service: Vascular;  Laterality: Right;  . IR THORACENTESIS ASP PLEURAL SPACE W/IMG GUIDE  05/19/2019  . LAMINECTOMY  05/27/2009   Lumbar decompressive laminectomy, fusion and plating for lumbar spinal stensosis  . LUMBAR LAMINECTOMY/DECOMPRESSION MICRODISCECTOMY Left 03/23/2013   Procedure: LUMBAR LAMINECTOMY/DECOMPRESSION MICRODISCECTOMY 1 LEVEL;  Surgeon: Eustace Moore, MD;  Location: [place] [facility];  Service: Neurosurgery;  Laterality: Left;  LUMBAR LAMINECTOMY/DECOMPRESSION MICRODISCECTOMY 1 LEVEL  .  MASTECTOMY, PARTIAL Left 02/26/2003   ; S/P re-excision of cranial and lateral margins 04/19/2003.   [place] RE-EXCISION OF BREAST CANCER,SUPERIOR MARGINS Right 10/27/2012   Procedure: RE-EXCISION OF BREAST CANCER,SUPERIOR and inferior MARGINS;  Surgeon: [name], MD;  Location: [place];  Service: General;  Laterality: Right;  . RE-EXCISION OF BREAST LUMPECTOMY Left 04/2003  . TEE WITHOUT CARDIOVERSION N/A 04/04/2019   Procedure: Transesophageal Echocardiogram (Tee);  Surgeon: Wonda Olds, MD;  Location: [place];  Service: Open Heart Surgery;  Laterality: N/A;  . THORACIC AORTIC ANEURYSM REPAIR N/A 04/04/2019   Procedure: THORACIC ASCENDING ANEURYSM REPAIR (AAA)  USING 28 MM X 30 CM HEMASHIELD PLATINUM VASCULAR GRAFT;  Surgeon: Wonda Olds, MD;  Location[facility];  Service: Open Heart Surgery;  Laterality: N/A;    Social History   Tobacco Use  . Smoking status: Current Some Day Smoker     Packs/day: 0.25    Years: 44.00    Pack years: 11.00    Types: Cigarettes  . Smokeless tobacco: Never Used  . Tobacco comment: smoking less  Substance Use Topics  . Alcohol use: Yes    Alcohol/week: 2.0 standard drinks    Types: 2 Cans of beer per week    Comment:  "couple beers q weekend"    Family History  Problem Relation Age of Onset  . Colon cancer Mother 10  . Hypertension Mother   . Diabetes Sister 43  . Hypertension Sister   . Diabetes Brother   . Hypertension Brother   . Diabetes Brother   . Hypertension Brother   . Kidney disease Son        On dialysis  . Hypertension Son   . Diabetes Son   . Multiple sclerosis Son   . Bone cancer Sister 98  . Breast cancer Neg Hx   . Cervical cancer Neg Hx     ROS Per hpi  Objective  Vitals as reported by the patient: none   ASSESSMENT and PLAN  1. ESRD on dialysis [facility]) Discussed with patient important of seeing renal and discussing concerns. Will reach out to nephrologist at HD clinic.   2. Malnutrition of moderate degree Reminded patient of significant health events. Discussed supplementation, trial of remeron for appetite stimulation, will see if HD clinic has nutritionist.  3. On Coumadin for atrial fibrillation ([place]) Managed at HD clinic. Has upcoming appt with cards  4. S/P aortic aneurysm repair Managed by ct surg  5. History of CVA (cerebrovascular accident) without residual deficits Patient denies sequela, has upcoming appt with neuro  Other orders - mirtazapine (REMERON) 7.5 MG tablet; Take 1 tablet (7.5 mg total) by mouth at bedtime.  FOLLOW-UP: 2-4 weeks   The above assessment and management plan was discussed with the patient. The patient verbalized understanding of and has agreed to the management plan. Patient is aware to call the clinic if symptoms persist or worsen. Patient is aware when to return to the clinic for a follow-up visit. Patient educated on when it is appropriate to go to the  emergency department.    I provided 14 minutes of non-face-to-face time during this encounter.  [name], MD Primary Care at [facility] [place], [place] [address] Ph.  [phone] Fax [phone]

## 2019-05-31 ENCOUNTER — Other Ambulatory Visit: Payer: Self-pay | Admitting: Physician Assistant

## 2019-05-31 ENCOUNTER — Other Ambulatory Visit: Payer: Self-pay

## 2019-06-01 ENCOUNTER — Emergency Department (HOSPITAL_COMMUNITY): Payer: Medicare Other

## 2019-06-01 ENCOUNTER — Emergency Department (HOSPITAL_COMMUNITY)
Admission: EM | Admit: 2019-06-01 | Discharge: 2019-06-01 | Disposition: A | Discharge status: Home / Self Care | Payer: Medicare Other | Attending: Emergency Medicine | Admitting: Emergency Medicine

## 2019-06-01 ENCOUNTER — Other Ambulatory Visit: Payer: Self-pay

## 2019-06-01 DIAGNOSIS — F17210 Nicotine dependence, cigarettes, uncomplicated: Secondary | ICD-10-CM | POA: Diagnosis not present

## 2019-06-01 DIAGNOSIS — Z79899 Other long term (current) drug therapy: Secondary | ICD-10-CM | POA: Insufficient documentation

## 2019-06-01 DIAGNOSIS — I120 Hypertensive chronic kidney disease with stage 5 chronic kidney disease or end stage renal disease: Secondary | ICD-10-CM | POA: Insufficient documentation

## 2019-06-01 DIAGNOSIS — Z992 Dependence on renal dialysis: Secondary | ICD-10-CM | POA: Diagnosis not present

## 2019-06-01 DIAGNOSIS — R0602 Shortness of breath: Secondary | ICD-10-CM | POA: Insufficient documentation

## 2019-06-01 DIAGNOSIS — J449 Chronic obstructive pulmonary disease, unspecified: Secondary | ICD-10-CM | POA: Diagnosis not present

## 2019-06-01 DIAGNOSIS — N186 End stage renal disease: Secondary | ICD-10-CM | POA: Insufficient documentation

## 2019-06-01 DIAGNOSIS — R0789 Other chest pain: Secondary | ICD-10-CM | POA: Diagnosis not present

## 2019-06-01 LAB — CBC WITH DIFFERENTIAL/PLATELET
Abs Immature Granulocytes: 0.07 10*3/uL (ref 0.00–0.07)
Basophils Absolute: 0.1 10*3/uL (ref 0.0–0.1)
Basophils Relative: 1 %
Eosinophils Absolute: 0.2 10*3/uL (ref 0.0–0.5)
Eosinophils Relative: 1 %
HCT: 32.9 % — ABNORMAL LOW (ref 36.0–46.0)
Hemoglobin: 10 g/dL — ABNORMAL LOW (ref 12.0–15.0)
Immature Granulocytes: 1 %
Lymphocytes Relative: 9 %
Lymphs Abs: 1.2 10*3/uL (ref 0.7–4.0)
MCH: 27.8 pg (ref 26.0–34.0)
MCHC: 30.4 g/dL (ref 30.0–36.0)
MCV: 91.4 fL (ref 80.0–100.0)
Monocytes Absolute: 1 10*3/uL (ref 0.1–1.0)
Monocytes Relative: 8 %
Neutro Abs: 10.2 10*3/uL — ABNORMAL HIGH (ref 1.7–7.7)
Neutrophils Relative %: 80 %
Platelets: 453 10*3/uL — ABNORMAL HIGH (ref 150–400)
RBC: 3.6 MIL/uL — ABNORMAL LOW (ref 3.87–5.11)
RDW: 17.9 % — ABNORMAL HIGH (ref 11.5–15.5)
WBC: 12.7 10*3/uL — ABNORMAL HIGH (ref 4.0–10.5)
nRBC: 0 % (ref 0.0–0.2)

## 2019-06-01 LAB — BASIC METABOLIC PANEL
Anion gap: 13 (ref 5–15)
BUN: 18 mg/dL (ref 8–23)
CO2: 24 mmol/L (ref 22–32)
Calcium: 9.1 mg/dL (ref 8.9–10.3)
Chloride: 99 mmol/L (ref 98–111)
Creatinine, Ser: 3.79 mg/dL — ABNORMAL HIGH (ref 0.44–1.00)
GFR calc Af Amer: 14 mL/min — ABNORMAL LOW (ref >60)
GFR calc non Af Amer: 12 mL/min — ABNORMAL LOW (ref >60)
Glucose, Bld: 139 mg/dL — ABNORMAL HIGH (ref 70–99)
Potassium: 3.9 mmol/L (ref 3.5–5.1)
Sodium: 136 mmol/L (ref 135–145)

## 2019-06-01 LAB — TROPONIN I (HIGH SENSITIVITY)
Troponin I (High Sensitivity): 66 ng/L — ABNORMAL HIGH (ref <18)
Troponin I (High Sensitivity): 66 ng/L — ABNORMAL HIGH (ref <18)

## 2019-06-01 LAB — PROTIME-INR
INR: 1.2 (ref 0.8–1.2)
Prothrombin Time: 14.6 seconds (ref 11.4–15.2)

## 2019-06-01 LAB — BRAIN NATRIURETIC PEPTIDE: B Natriuretic Peptide: 732.8 pg/mL — ABNORMAL HIGH (ref 0.0–100.0)

## 2019-06-01 MED ORDER — OXYCODONE-ACETAMINOPHEN 5-325 MG PO TABS
1.0000 | ORAL_TABLET | Freq: Once | ORAL | Status: AC
  Administered 2019-06-01: 1 via ORAL
  Filled 2019-06-01: qty 1

## 2019-06-01 MED ORDER — OXYCODONE-ACETAMINOPHEN 5-325 MG PO TABS: 2.0000 | ORAL_TABLET | Freq: Four times a day (QID) | ORAL | 0 refills | Status: DC | PRN

## 2019-06-01 MED ORDER — FENTANYL CITRATE (PF) 100 MCG/2ML IJ SOLN
100.0000 ug | Freq: Once | INTRAMUSCULAR | Status: AC
  Administered 2019-06-01: 100 ug via INTRAVENOUS
  Filled 2019-06-01: qty 2

## 2019-06-01 NOTE — ED Triage Notes (Signed)
BIB [facility], complaining of chest pain following her heart surgery on November 2020. She has been in the hospital for every couple of weeks for the same reason. She was given 2 SL nitro and aspirin en route to the hospital, verbalized helped a little bit. Patient is alert and oriented x4.

## 2019-06-01 NOTE — ED Notes (Signed)
[phone] Lurlene pts daughter

## 2019-06-01 NOTE — ED Provider Notes (Signed)
[facility] EMERGENCY DEPARTMENT Provider Note   CSN: [id number] Arrival date & time: 06/01/19  0001     History Chief Complaint  Patient presents with  . Chest Pain    Monique Henderson is a 68 y.o. female.  HPI     This is a 68 year old female with recent history of acsending aortic dissection repair in November 2020, end-stage renal disease on dialysis, hepatitis C, atrial fibrillation, breast cancer who presents with chest pain.  Patient has had ongoing chest pain since she had surgery in November.  She has had multiple ED evaluations and admissions for the same.  She states that she gets anterior chest pain that is worse with breathing.  She also reports worsening shortness of breath.  She states that she is unable to sleep at night.  She denies any lower extremity swelling or cough.  No fevers.  The pain is the same she has had since her surgery.  She does not take anything for her pain.  Currently her pain is 10 out of 10.  She reports that her shortness of breath has gotten worse.  She states it is worse when she is on dialysis.  She reports weight loss and general decline since surgery.  Chart reviewed.  Most recent admission with discharge on December 23.  At that time she had a negative cardiac work-up.  She did undergo thoracentesis for a pleural effusion which was thought to possibly be contributing to her symptoms.  She was also found to have a small stroke on admission.  She was started on Coumadin given her history of atrial fibrillation.  Past Medical History:  Diagnosis Date  . Atelectasis 2002   Bilateral  . Bone spur 2008   Right calcaneal foot spur  . Breast cancer ([place]) 2004   Ductal carcinoma in situ of the left breast; S/P left partial mastectomy 02/26/2003; S/P re-excision of cranial and lateral margins11/18/2004.radiation  . Breast cancer ([place]) 09/21/2012   right breast/ last radiation treatment 03/22/2013  . Chronic kidney disease, stage  IV (severe) ([place]) 10/10/2007  . 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 Autumn Messing; S/P re-excision of superior and inferior margins 10/27/2012.   Marland Kitchen GERD (gastroesophageal reflux disease)   . 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 Hot flashes   . Hx of radiation therapy 2005   left breast  . Hx of radiation therapy 01/11/13- 03/22/13   right breast 5760 cGy 30 sessions  . Hypertension   . Low back pain   . Lumbar spinal stenosis    S/P lumbar decompressive laminectomy, fusion and plating for lumbar spinal stensosis  . Normocytic anemia    With thrombocytosis  . Osteoarthritis   . Personal history of radiation therapy   . Right ureteral stone 2002  . Shortness of breath    from pain  . Uterine fibroid   . Wears dentures    top    Patient Active Problem List   Diagnosis Date Noted  . History of CVA (cerebrovascular accident) without residual deficits 05/29/2019  . On Coumadin for atrial fibrillation ([place]) 05/29/2019  . ESRD on dialysis ([place]) 05/29/2019  . Cerebral thrombosis with cerebral infarction 05/22/2019  . Malnutrition of moderate degree 05/19/2019  . Pleural effusion on left 05/18/2019  . Atrial fibrillation with RVR ([place]) 05/18/2019  . S/P aortic aneurysm repair 04/07/2019  . Aortic dissection (  [place]) 04/04/2019  . Dissection of aorta ([place]) 04/03/2019  . Non compliance with medical treatment 12/04/2017  . Chronic kidney disease (CKD) stage G3b/A2, moderately decreased glomerular filtration rate (GFR) between 30-44 mL/min/1.73 square meter and albuminuria creatinine ratio between 30-299 mg/g 07/19/2017  . Chronic obstructive lung disease ([place]) 01/16/2017  . Chronic low back pain 06/22/2016  . Insomnia 03/14/2015  . S/P lumbar spinal fusion 01/18/2014  . Tobacco use disorder 04/19/2009  . Essential hypertension 09/16/2006  . GERD 09/16/2006    Past Surgical History:  Procedure  Laterality Date  . ANTERIOR LAT LUMBAR FUSION N/A 01/18/2014   Procedure: ANTERIOR LATERAL LUMBAR FUSION LUMBAR TWO-THREE;  Surgeon: Eustace Moore, MD;  Location: [facility];  Service: Neurosurgery;  Laterality: N/A;  . ANTERIOR LUMBAR FUSION  01/18/2014  . AV FISTULA PLACEMENT Left 04/20/2019   Procedure: ARTERIOVENOUS (AV) FISTULA CREATION;  Surgeon: [name], MD;  Location: [facility];  Service: Vascular;  Laterality: Left;  . BACK SURGERY    . BREAST LUMPECTOMY Left 01/2003  . BREAST LUMPECTOMY Right 2014  . BREAST LUMPECTOMY WITH NEEDLE LOCALIZATION AND AXILLARY SENTINEL LYMPH NODE BX Right 10/13/2012   Procedure: BREAST LUMPECTOMY WITH NEEDLE LOCALIZATION;  Surgeon: [name], MD;  Location: [facility];  Service: General;  Laterality: Right;  Right breast wire localized lumpectomy  . INSERTION OF DIALYSIS CATHETER Right 04/20/2019   Procedure: INSERTION OF DIALYSIS CATHETER, right internal jugular;  Surgeon: [name], MD;  Location: [facility];  Service: Vascular;  Laterality: Right;  . IR THORACENTESIS ASP PLEURAL SPACE W/IMG GUIDE  05/19/2019  . LAMINECTOMY  05/27/2009   Lumbar decompressive laminectomy, fusion and plating for lumbar spinal stensosis  . LUMBAR LAMINECTOMY/DECOMPRESSION MICRODISCECTOMY Left 03/23/2013   Procedure: LUMBAR LAMINECTOMY/DECOMPRESSION MICRODISCECTOMY 1 LEVEL;  Surgeon: Eustace Moore, MD;  Location: [facility];  Service: Neurosurgery;  Laterality: Left;  LUMBAR LAMINECTOMY/DECOMPRESSION MICRODISCECTOMY 1 LEVEL  . MASTECTOMY, PARTIAL Left 02/26/2003   ; S/P re-excision of cranial and lateral margins 04/19/2003.   Marland Kitchen RE-EXCISION OF BREAST CANCER,SUPERIOR MARGINS Right 10/27/2012   Procedure: RE-EXCISION OF BREAST CANCER,SUPERIOR and inferior MARGINS;  Surgeon: [name], MD;  Location: [facility];  Service: General;  Laterality: Right;  . RE-EXCISION OF BREAST LUMPECTOMY Left 04/2003  . TEE WITHOUT CARDIOVERSION N/A  04/04/2019   Procedure: Transesophageal Echocardiogram (Tee);  Surgeon: Wonda Olds, MD;  Location: [facility];  Service: Open Heart Surgery;  Laterality: N/A;  . THORACIC AORTIC ANEURYSM REPAIR N/A 04/04/2019   Procedure: THORACIC ASCENDING ANEURYSM REPAIR (AAA)  USING 28 MM X 30 CM HEMASHIELD PLATINUM VASCULAR GRAFT;  Surgeon: Wonda Olds, MD;  [facility];  Service: Open Heart Surgery;  Laterality: N/A;     OB History    Gravida  2   Para  2   Term  1   Preterm  1   AB      Living        SAB      TAB      Ectopic      Multiple      Live Births              Family History  Problem Relation Age of Onset  . Colon cancer Mother 43  . Hypertension Mother   . Diabetes Sister 76  . Hypertension Sister   . Diabetes Brother   . Hypertension Brother   . Diabetes Brother   .  Hypertension Brother   . Kidney disease Son        On dialysis  . Hypertension Son   . Diabetes Son   . Multiple sclerosis Son   . Bone cancer Sister 85  . Breast cancer Neg Hx   . Cervical cancer Neg Hx     Social History   Tobacco Use  . Smoking status: Current Some Day Smoker    Packs/day: 0.25    Years: 44.00    Pack years: 11.00    Types: Cigarettes  . Smokeless tobacco: Never Used  . Tobacco comment: smoking less  Substance Use Topics  . Alcohol use: Yes    Alcohol/week: 2.0 standard drinks    Types: 2 Cans of beer per week    Comment:  "couple beers q weekend"  . Drug use: Yes    Types: Cocaine    Comment: "stopped back in the 1980's"    Home Medications Prior to Admission medications   Medication Sig Start Date End Date Taking? Authorizing Provider  albuterol (VENTOLIN HFA) 108 (90 Base) MCG/ACT inhaler Inhale 2 puffs into the lungs every 6 (six) hours as needed for wheezing or shortness of breath. 02/02/19   [name], MD  amLODipine (NORVASC) 10 MG tablet TAKE 1 TABLET (10 MG TOTAL) BY MOUTH DAILY. 09/20/18   [name], MD  aspirin EC 81 MG  tablet Take 1 tablet (81 mg total) by mouth daily. Until INR > 2. Then stop and continue coumadin only. 05/23/19   Dessa Phi, DO  cloNIDine (CATAPRES - DOSED IN MG/24 HR) 0.3 mg/24hr patch Place 1 patch (0.3 mg total) onto the skin once a week. 04/22/19   Nani Skillern, PA-C  famotidine (PEPCID) 10 MG tablet Take 1 tablet (10 mg total) by mouth at bedtime. 05/04/18   [name], MD  hydrALAZINE (APRESOLINE) 25 MG tablet Take 1 tablet (25 mg total) by mouth every 8 (eight) hours. 04/21/19   Nani Skillern, PA-C  metoprolol tartrate (LOPRESSOR) 50 MG tablet Take 1 tablet (50 mg total) by mouth 2 (two) times daily. 04/21/19   Nani Skillern, PA-C  mirtazapine (REMERON) 7.5 MG tablet Take 1 tablet (7.5 mg total) by mouth at bedtime. 05/29/19   [name], MD  multivitamin (RENA-VIT) TABS tablet Take 1 tablet by mouth at bedtime. 04/21/19   Nani Skillern, PA-C  ondansetron (ZOFRAN) 4 MG tablet Take 1 tablet (4 mg total) by mouth every 8 (eight) hours as needed for nausea or vomiting. 04/22/19   Nani Skillern, PA-C  oxyCODONE-acetaminophen (PERCOCET/ROXICET) 5-325 MG tablet Take 2 tablets by mouth every 6 (six) hours as needed for severe pain. 06/01/19   Evren Shankland, Barbette Hair, MD  warfarin (COUMADIN) 2.5 MG tablet Take 1 tablet (2.5 mg total) by mouth daily. 05/23/19 08/21/19  Dessa Phi, DO    Allergies    Shrimp [shellfish allergy], Bactroban [mupirocin], Vicodin [hydrocodone-acetaminophen], Lisinopril, and Tylenol [acetaminophen]  Review of Systems   Review of Systems  Constitutional: Negative for fever.  Respiratory: Positive for shortness of breath. Negative for cough.   Cardiovascular: Positive for chest pain. Negative for leg swelling.  Gastrointestinal: Negative for abdominal pain, nausea and vomiting.  Genitourinary: Negative for dysuria.  All other systems reviewed and are negative.   Physical Exam Updated Vital Signs BP (!) 178/99    Pulse 85   Temp 98.5 F (36.9 C) (Oral)   Resp (!) 23   Ht 1.6 m (5\' 3" )  Wt 46 kg   SpO2 95%   BMI 17.96 kg/m   Physical Exam Vitals and nursing note reviewed.  Constitutional:      Comments: Chronically ill-appearing, nontoxic  HENT:     Head: Normocephalic and atraumatic.  Eyes:     Pupils: Pupils are equal, round, and reactive to light.  Cardiovascular:     Rate and Rhythm: Normal rate and regular rhythm.     Heart sounds: Normal heart sounds.  Pulmonary:     Effort: Pulmonary effort is normal. No respiratory distress.     Breath sounds: No wheezing.  Chest:     Comments: Well-healing midsternotomy wound, no crepitus or tenderness, no overlying skin changes Tunneled catheter right upper chest Abdominal:     General: Bowel sounds are normal.     Palpations: Abdomen is soft.     Tenderness: There is no guarding or rebound.  Musculoskeletal:     Cervical back: Neck supple.     Right lower leg: No tenderness. No edema.     Left lower leg: No tenderness. No edema.  Skin:    General: Skin is warm and dry.  Neurological:     Mental Status: She is alert and oriented to person, place, and time.  Psychiatric:        Mood and Affect: Mood normal.     ED Results / Procedures / Treatments   Labs (all labs ordered are listed, but only abnormal results are displayed) Labs Reviewed  CBC WITH DIFFERENTIAL/PLATELET - Abnormal; Notable for the following components:      Result Value   WBC 12.7 (*)    RBC 3.60 (*)    Hemoglobin 10.0 (*)    HCT 32.9 (*)    RDW 17.9 (*)    Platelets 453 (*)    Neutro Abs 10.2 (*)    All other components within normal limits  BASIC METABOLIC PANEL - Abnormal; Notable for the following components:   Glucose, Bld 139 (*)    Creatinine, Ser 3.79 (*)    GFR calc non Af Amer 12 (*)    GFR calc Af Amer 14 (*)    All other components within normal limits  BRAIN NATRIURETIC PEPTIDE - Abnormal; Notable for the following components:   B  Natriuretic Peptide 732.8 (*)    All other components within normal limits  TROPONIN I (HIGH SENSITIVITY) - Abnormal; Notable for the following components:   Troponin I (High Sensitivity) 66 (*)    All other components within normal limits  TROPONIN I (HIGH SENSITIVITY) - Abnormal; Notable for the following components:   Troponin I (High Sensitivity) 66 (*)    All other components within normal limits  PROTIME-INR    EKG EKG Interpretation  Date/Time:  Thursday June 01 2019 00:14:44 EST Ventricular Rate:  90 PR Interval:    QRS Duration: 79 QT Interval:  389 QTC Calculation: 476 R Axis:   18 Text Interpretation: Sinus rhythm Consider right atrial enlargement Consider left ventricular hypertrophy Nonspecific T abnormalities, lateral leads Confirmed by Thayer Jew [phone]) on 06/01/2019 1:08:15 AM   Radiology DG Chest Portable 1 View  Result Date: 06/01/2019 CLINICAL DATA:  Chest pain EXAM: PORTABLE CHEST 1 VIEW COMPARISON:  05/19/2019 FINDINGS: Right-sided central venous catheter tip over the SVC. Post sternotomy changes. Similar appearance of small left pleural effusion and airspace disease at the left base. No definitive pneumothorax. Mild cardiomegaly. Aortic atherosclerosis. IMPRESSION: 1. No significant change in small left effusion and left basilar atelectasis  or pneumonia 2. Stable degree of cardiomegaly Electronically Signed   By: Donavan Foil M.D.   On: 06/01/2019 01:40    Procedures Procedures (including critical care time)  Medications Ordered in ED Medications  fentaNYL (SUBLIMAZE) injection 100 mcg (100 mcg Intravenous Given 06/01/19 0120)  oxyCODONE-acetaminophen (PERCOCET/ROXICET) 5-325 MG per tablet 1 tablet (1 tablet Oral Given 06/01/19 0247)    ED Course  I have reviewed the triage vital signs and the nursing notes.  Pertinent labs & imaging results that were available during my care of the patient were reviewed by me and considered in my medical  decision making (see chart for details).    MDM Rules/Calculators/A&P                       Patient presents with ongoing worsening chest pain or shortness of breath.  She is chronically ill-appearing but nontoxic on exam.  Notably hypertensive but otherwise vital signs are reassuring.  She is afebrile.  EKG without ischemia or arrhythmia.  Patient with recent evaluation for PE which was negative for PE on December 17.  Have low suspicion at this time.  She is supposed to be taking Coumadin.  She is subtherapeutic.  Troponin is 66.  Repeat is stable at 66.  Doubt ACS.  She has a history of end-stage renal disease which likely accounts for the slight elevation.  I discussed the work-up with the patient.  I have reviewed the [facility] narcotic database.  She states she has not been taking anything recently for her pain.  She recently got tramadol.  Prior to that she got regular prescriptions monthly.  I have encouraged her to follow-up for her ongoing pain management needs with her primary physician.  I will give her 6 tablets of Percocet but instructed her that she will need to get ongoing pain management needs from her primary physician.  Patient stated understanding.  After history, exam, and medical workup I feel the patient has been appropriately medically screened and is safe for discharge home. Pertinent diagnoses were discussed with the patient. Patient was given return precautions.   Final Clinical Impression(s) / ED Diagnoses Final diagnoses:  Atypical chest pain    Rx / DC Orders ED Discharge Orders         Ordered    oxyCODONE-acetaminophen (PERCOCET/ROXICET) 5-325 MG tablet  Every 6 hours PRN     06/01/19 0353           Merryl Hacker, MD 06/01/19 [phone]

## 2019-06-01 NOTE — Discharge Instructions (Addendum)
You were seen today for chest pain.  Your work-up again is fairly reassuring.  It is very important that you follow-up with your primary physician for ongoing pain management needs.  You have had multiple work-ups for chest pain and you may have some ongoing discomfort related to surgery.  If you have any new or worsening symptoms you should be reevaluated.

## 2019-06-05 ENCOUNTER — Ambulatory Visit
Admission: RE | Admit: 2019-06-05 | Discharge: 2019-06-05 | Disposition: A | Discharge status: Home / Self Care | Payer: Medicare Other | Source: Ambulatory Visit | Attending: Cardiothoracic Surgery | Admitting: Cardiothoracic Surgery

## 2019-06-05 ENCOUNTER — Ambulatory Visit (INDEPENDENT_AMBULATORY_CARE_PROVIDER_SITE_OTHER): Payer: Self-pay | Admitting: Cardiothoracic Surgery

## 2019-06-05 ENCOUNTER — Encounter: Payer: Self-pay | Admitting: Cardiothoracic Surgery

## 2019-06-05 ENCOUNTER — Other Ambulatory Visit: Payer: Self-pay

## 2019-06-05 ENCOUNTER — Other Ambulatory Visit: Payer: Self-pay | Admitting: *Deleted

## 2019-06-05 VITALS — BP 106/64 | HR 64 | Temp 97.3°F | Resp 16 | Ht 63.0 in | Wt 105.6 lb

## 2019-06-05 DIAGNOSIS — Z992 Dependence on renal dialysis: Secondary | ICD-10-CM

## 2019-06-05 DIAGNOSIS — I4891 Unspecified atrial fibrillation: Secondary | ICD-10-CM

## 2019-06-05 DIAGNOSIS — N186 End stage renal disease: Secondary | ICD-10-CM

## 2019-06-05 DIAGNOSIS — Z8679 Personal history of other diseases of the circulatory system: Secondary | ICD-10-CM

## 2019-06-05 DIAGNOSIS — Z95828 Presence of other vascular implants and grafts: Secondary | ICD-10-CM

## 2019-06-05 DIAGNOSIS — Z7901 Long term (current) use of anticoagulants: Secondary | ICD-10-CM

## 2019-06-05 DIAGNOSIS — Z8673 Personal history of transient ischemic attack (TIA), and cerebral infarction without residual deficits: Secondary | ICD-10-CM

## 2019-06-05 DIAGNOSIS — Z9889 Other specified postprocedural states: Secondary | ICD-10-CM

## 2019-06-05 NOTE — Patient Outreach (Signed)
[facility]) Care Management  06/05/2019  Monique Henderson 1950-12-19 [id number]   Subjective: Telephone call to patient's home / mobile number, spoke with patient, and HIPAA verified.  Discussed [facility] [employer] EMMI Stroke Red Flag alert follow up, patient voiced understanding, and is in agreement to follow up.  Patient states she is getting ready to go to a follow up appointment with the surgeon( Dr. Orvan Seen) at 10:00am this morning. States she has a follow up appointment with primary MD on 05/29/2019 and appointment went well.   States she has not received a call from [facility] regarding home health physical therapy and is planning to follow up.  Patient states she is hanging in there, doing much better, and doing good overall.   States she remembers receiving EMMI automated calls, is feeling better, and no longer having breathing difficulty.   States she was suppose to have dialysis on Sat or Sunday, did not go, because she was not feeling up to it, and is planning to go tomorrow (Tuesday).  States she will picked up at 5:00am on Tuesdays for dialysis.  Patient voices understanding of medical diagnosis and treatment plan.  Patient unable to complete screening assessment follow up at this time due to getting ready for MD appointment and is in agreement to call back at a later time.  States she is very appreciative of the follow up and is in agreement to receive [facility] EMMI follow up calls as needed.     Objective: Per KPN (Knowledge Performance Now, point of care tool) and chart review, patient hospitalized 05/18/2019 - 05/24/2019 for Pleural effusion on left, Acute left parietal CVA , Acute toxic metabolic encephalopathy, Malnutrition of moderate degree.   Patient hospitalized 04/03/2019 - 04/23/2019 for Dissection of aorta, status post replacement of ascending thoracic aorta with supra coronary graft implantation and distal hemiarch  reconstruction with right axillary cannulation on 04/04/2019.  Patient had ED visit on 06/01/2019 and 05/13/2019 for chest pain.   Patient also has a history of hypertension, tobacco abuse, Breast cancer, radiation treatment, end-stage renal disease on hemodialysis (Tuesdays, Thursdays, Saturdays ), Chronic kidney disease, stage IV, COPD, atrial fibrillation,  Hepatitis C, low back pain, lumbar spinal stenosis, and bone spur.        Assessment: Received [employer] EMMI Stroke Google Alert follow up referral on 06/01/2019.  Red Flag Alert Triggers, Day #6, patient answered yes to the following questions: Feeling worse overall?  New problems walking/talking/speaking/seeing?  EMMI follow up completed and will follow up to assess further care management needs.     Plan:  RNCM will call patient for 2nd telephone outreach attempt within 4 business days, assess for care management needs, follow up on status of home health services, and proceed with case closure, within 10 business days if no return call.     Clemmie Marxen H. Annia Friendly, BSN, [facility] Telephonic CM Phone: [phone] Fax: [phone]

## 2019-06-05 NOTE — Progress Notes (Signed)
Monique Henderson       [place] [address]             [phone]     CARDIOTHORACIC SURGERY OFFICE NOTE  Referring Provider is Drenda Freeze, MD Primary Cardiologist is No primary care provider on file. PCP is [name], MD   HPI:  69 yo lady underwent emergency repair of Type A dissection 2 mos ago. She had CKD stage 4 prior to surgery. She has been dialyzing since then, to which she objects. She is losing weight because of a very restricted diet. She has been seen several times for atypical chest pain, and the work-up has remained negative. That said, she has not had formal work-up for CAD.    Current Outpatient Medications  Medication Sig Dispense Refill  . albuterol (VENTOLIN HFA) 108 (90 Base) MCG/ACT inhaler Inhale 2 puffs into the lungs every 6 (six) hours as needed for wheezing or shortness of breath. 8 g 5  . amLODipine (NORVASC) 10 MG tablet TAKE 1 TABLET (10 MG TOTAL) BY MOUTH DAILY. 90 tablet 0  . aspirin EC 81 MG tablet Take 1 tablet (81 mg total) by mouth daily. Until INR > 2. Then stop and continue coumadin only. 30 tablet 0  . cloNIDine (CATAPRES - DOSED IN MG/24 HR) 0.3 mg/24hr patch Place 1 patch (0.3 mg total) onto the skin once a week. 4 patch 2  . famotidine (PEPCID) 10 MG tablet Take 1 tablet (10 mg total) by mouth at bedtime. 90 tablet 1  . hydrALAZINE (APRESOLINE) 25 MG tablet Take 1 tablet (25 mg total) by mouth every 8 (eight) hours. 90 tablet 1  . metoprolol tartrate (LOPRESSOR) 50 MG tablet Take 1 tablet (50 mg total) by mouth 2 (two) times daily. 60 tablet 1  . mirtazapine (REMERON) 7.5 MG tablet Take 1 tablet (7.5 mg total) by mouth at bedtime. 30 tablet 2  . multivitamin (RENA-VIT) TABS tablet Take 1 tablet by mouth at bedtime. 30 tablet 1  . ondansetron (ZOFRAN) 4 MG tablet Take 1 tablet (4 mg total) by mouth every 8 (eight) hours as needed for nausea or vomiting. 20 tablet 0  . oxyCODONE-acetaminophen (PERCOCET/ROXICET)  5-325 MG tablet Take 2 tablets by mouth every 6 (six) hours as needed for severe pain. 6 tablet 0  . warfarin (COUMADIN) 2.5 MG tablet Take 1 tablet (2.5 mg total) by mouth daily. 30 tablet 2   No current facility-administered medications for this visit.      Physical Exam:   BP 106/64 (BP Location: Right Arm, Patient Position: Sitting, Cuff Size: Normal)   Pulse 64   Temp (!) 97.3 F (36.3 C)   Resp 16   Ht 5\' 3"  (1.6 m)   Wt 47.9 kg   SpO2 94% Comment: RA  BMI 18.71 kg/m   General:  chroniclly ill appearing, NAD  Chest:   cta  CV:   rrr  Incisions:  C/d/i  Abdomen:  sntnd  Extremities:  No edema  Diagnostic Tests:  CXR with clear lung fields; I disagree with prior assessment of left pleural effusion (s/p left thoracentesis of 320 mL 05/19/19)  Impression:  69 yo lady now 2 months s/p repair of Type A dissection  Plan:  Request for front-wheel walker provided Referral to Dr. Einar Gip for evaluation of atypical chest pain in patient with multiple cardiovascular risk factors Encouraged her to eat  F/u with CT surgery as needed. There are no  outstanding issues that we would need to provide surveillance services such as residual dissection flap or descending component.   I spent in excess of  20 minutes during the conduct of this office consultation and >50% of this time involved direct face-to-face encounter with the patient for counseling and/or coordination of their care.  Level 2                 10 minutes Level 3                 15 minutes Level 4                 25 minutes Level 5                 40 minutes  B. Murvin Natal, MD 06/05/2019 11:48 AM

## 2019-06-06 ENCOUNTER — Other Ambulatory Visit: Payer: Self-pay | Admitting: *Deleted

## 2019-06-06 ENCOUNTER — Encounter: Payer: Self-pay | Admitting: *Deleted

## 2019-06-06 NOTE — Patient Outreach (Signed)
[facility]) Care Management  06/06/2019  Monique Henderson 18-Aug-1950 [id number]   Subjective: Telephone call to patient's home / mobile number, spoke with patient, and HIPAA verified. Discussed [place] Medicare EMMI Stroke Red Flag alert follow up, patient voiced understanding, and is in agreement to follow up.  Patient states she remembers speaking with this RNCM on 06/05/2019 and receiving additional EMMI automated call.  Discussed Day # 9 EMMI Red Flag Alerts, patient states she is feeling better overall, shortness of breath has improved, not able to eat some foods, certain foods do not agree with her appetite or taste, she discussed her concerns with surgeon on 06/05/2019, was advised to eat whatever agrees with her appetite since she is losing weight, and seems to be wasting away.  States she started dialysis approximately 2 months ago, missed her 2nd treatment today since starting dialysis, and missed 1st treatment on  Saturday (06/03/2019).   States she did not feel like going, does not like the way it makes her feel, makes her feel bad, and unable to resume normal activities of daily living for a day or two after a treatment.  States she discussed dialysis concerns with surgeon, was assessed, and had no additional fluid on her lungs or in body during 06/05/2019 visit. RNCM advised patient to speak with nephrologist regarding her concerns with dialysis, discussed importance of dialysis treatments, patient voices understanding, states she will not miss anymore treatments, and will follow up with nephrologist to discuss.  Patient states she is aware of signs/ symptoms to report, how to reach provider if needed after hours, when to go to ED, and / or call 911.   States her appointment with surgeon went well, was given a prescription for rollator walker, planning to obtain from local pharmacy ([facility]), and will notify this RNCM if assistance is needed with  obtaining.  Wellsite geologist also referred her to a cardiologist and will have a new patient appointment on 06/21/2019.  Patient states she also received a call on 06/05/2019 regarding home health start of care with [facility], was not given a specific date, and company stated they would give her call with an update.  RNCM advised patient of contact number [phone]) for [facility], per patient's request, states she will follow up with [facility], and notify this RNCM if assistance needed. Patient states she does not have any education material, EMMI follow up, care coordination, care management, disease monitoring, transportation, community resource, or pharmacy needs at this time.    States she is very appreciative of the follow up, in agreement to 1 additional follow up call to assess CM needs, and to continue to receive [place] Management EMMI follow up calls as needed.    Objective: Per KPN (Knowledge Performance Now, point of care tool) and chart review, patient hospitalized 05/18/2019 - 05/24/2019 for Pleural effusion on left, Acute left parietal CVA , Acute toxic metabolic encephalopathy, Malnutrition of moderate degree.   Patient hospitalized 04/03/2019 - 04/23/2019 for Dissection of aorta, status post replacement of ascending thoracic aorta with supra coronary graft implantation and distal hemiarch reconstruction with right axillary cannulation on 04/04/2019.  Patient had ED visit on 06/01/2019 and 05/13/2019 for chest pain.   Patient also has a history of hypertension, tobacco abuse, Breast cancer, radiation treatment, end-stage renal disease on hemodialysis (Tuesdays, Thursdays, Saturdays ), Chronic kidney disease, stage IV, COPD, atrial fibrillation,  Hepatitis C, low back pain, lumbar spinal stenosis, and bone spur.  Assessment: Received [employer] EMMI Stroke Google Alert follow up referral on 06/05/2019 and 06/01/2019.  Red Flag Alert Triggers, Day #9, patient answered yes to  the following questions: Feeling worse overall?   New or worsening pain/fever/shortness of breath?    Questions/problems with meds?   Patient answered no the following question:  Able to eat and drink?  Red Flag Alert Triggers, Day #6, patient answered yes to the following questions: Feeling worse overall?  New problems walking/talking/speaking/seeing?  EMMI follow up completed and will follow up to assess further care management needs.      Plan:  RNCM will call patient for telephone outreach attempt within 10 business days, assess for care management needs, follow up on status of home health services, and proceed with case closure, within 10 business days if no return call.    [name] H. Annia Friendly, BSN, [place] Management [facility] Telephonic CM Phone: [phone] Fax: [phone]

## 2019-06-08 ENCOUNTER — Emergency Department (HOSPITAL_COMMUNITY): Payer: Medicare Other

## 2019-06-08 ENCOUNTER — Emergency Department (HOSPITAL_COMMUNITY)
Admission: EM | Admit: 2019-06-08 | Discharge: 2019-06-08 | Disposition: A | Discharge status: Home / Self Care | Payer: Medicare Other | Attending: Emergency Medicine | Admitting: Emergency Medicine

## 2019-06-08 ENCOUNTER — Encounter (HOSPITAL_COMMUNITY): Payer: Self-pay | Admitting: Emergency Medicine

## 2019-06-08 ENCOUNTER — Other Ambulatory Visit: Payer: Self-pay | Admitting: *Deleted

## 2019-06-08 ENCOUNTER — Other Ambulatory Visit: Payer: Self-pay

## 2019-06-08 DIAGNOSIS — Z7901 Long term (current) use of anticoagulants: Secondary | ICD-10-CM | POA: Insufficient documentation

## 2019-06-08 DIAGNOSIS — Z79899 Other long term (current) drug therapy: Secondary | ICD-10-CM | POA: Diagnosis not present

## 2019-06-08 DIAGNOSIS — Z7982 Long term (current) use of aspirin: Secondary | ICD-10-CM | POA: Diagnosis not present

## 2019-06-08 DIAGNOSIS — I120 Hypertensive chronic kidney disease with stage 5 chronic kidney disease or end stage renal disease: Secondary | ICD-10-CM | POA: Diagnosis not present

## 2019-06-08 DIAGNOSIS — I4891 Unspecified atrial fibrillation: Secondary | ICD-10-CM | POA: Diagnosis not present

## 2019-06-08 DIAGNOSIS — Z853 Personal history of malignant neoplasm of breast: Secondary | ICD-10-CM | POA: Insufficient documentation

## 2019-06-08 DIAGNOSIS — R079 Chest pain, unspecified: Secondary | ICD-10-CM

## 2019-06-08 DIAGNOSIS — Z87891 Personal history of nicotine dependence: Secondary | ICD-10-CM | POA: Insufficient documentation

## 2019-06-08 DIAGNOSIS — Z992 Dependence on renal dialysis: Secondary | ICD-10-CM | POA: Diagnosis not present

## 2019-06-08 DIAGNOSIS — R0789 Other chest pain: Secondary | ICD-10-CM | POA: Diagnosis not present

## 2019-06-08 DIAGNOSIS — R0602 Shortness of breath: Secondary | ICD-10-CM | POA: Diagnosis not present

## 2019-06-08 DIAGNOSIS — N186 End stage renal disease: Secondary | ICD-10-CM | POA: Insufficient documentation

## 2019-06-08 LAB — TROPONIN I (HIGH SENSITIVITY)
Troponin I (High Sensitivity): 31 ng/L — ABNORMAL HIGH (ref <18)
Troponin I (High Sensitivity): 32 ng/L — ABNORMAL HIGH (ref <18)

## 2019-06-08 LAB — BASIC METABOLIC PANEL
Anion gap: 9 (ref 5–15)
BUN: 11 mg/dL (ref 8–23)
CO2: 28 mmol/L (ref 22–32)
Calcium: 8.2 mg/dL — ABNORMAL LOW (ref 8.9–10.3)
Chloride: 95 mmol/L — ABNORMAL LOW (ref 98–111)
Creatinine, Ser: 2.13 mg/dL — ABNORMAL HIGH (ref 0.44–1.00)
GFR calc Af Amer: 27 mL/min — ABNORMAL LOW (ref >60)
GFR calc non Af Amer: 24 mL/min — ABNORMAL LOW (ref >60)
Glucose, Bld: 110 mg/dL — ABNORMAL HIGH (ref 70–99)
Potassium: 4.2 mmol/L (ref 3.5–5.1)
Sodium: 132 mmol/L — ABNORMAL LOW (ref 135–145)

## 2019-06-08 LAB — CBC
HCT: 35.8 % — ABNORMAL LOW (ref 36.0–46.0)
Hemoglobin: 10.7 g/dL — ABNORMAL LOW (ref 12.0–15.0)
MCH: 26.5 pg (ref 26.0–34.0)
MCHC: 29.9 g/dL — ABNORMAL LOW (ref 30.0–36.0)
MCV: 88.6 fL (ref 80.0–100.0)
Platelets: 449 10*3/uL — ABNORMAL HIGH (ref 150–400)
RBC: 4.04 MIL/uL (ref 3.87–5.11)
RDW: 17.2 % — ABNORMAL HIGH (ref 11.5–15.5)
WBC: 7.9 10*3/uL (ref 4.0–10.5)
nRBC: 0 % (ref 0.0–0.2)

## 2019-06-08 LAB — CBG MONITORING, ED: Glucose-Capillary: 96 mg/dL (ref 70–99)

## 2019-06-08 MED ORDER — OXYCODONE HCL 5 MG PO TABS
5.0000 mg | ORAL_TABLET | Freq: Once | ORAL | Status: AC
  Administered 2019-06-08: 5 mg via ORAL
  Filled 2019-06-08: qty 1

## 2019-06-08 MED ORDER — IOHEXOL 350 MG/ML SOLN
100.0000 mL | Freq: Once | INTRAVENOUS | Status: AC | PRN
  Administered 2019-06-08: 100 mL via INTRAVENOUS

## 2019-06-08 MED ORDER — SODIUM CHLORIDE 0.9% FLUSH: 3.0000 mL | Freq: Once | INTRAVENOUS | Status: DC

## 2019-06-08 NOTE — Discharge Instructions (Addendum)
Return here as needed. Follow-up with your doctor for a recheck. Your testing tonight does not show any significant acute abnormalities.

## 2019-06-08 NOTE — Patient Outreach (Signed)
[name]) Care Management  06/08/2019  [name] Monique Henderson, Monique Henderson [id number]   Subjective: No patient outreach needed at this time, Red Flag Alert Day # 13 address with patient on 06/06/2019 outreach.     Objective:Per KPN (Knowledge Performance Now, point of care tool) and chart review,patient hospitalized 05/18/2019 - 05/24/2019 forPleural effusion on left,Acute left parietal CVA,Acute toxic metabolic encephalopathy,Malnutrition of moderate degree. Patient hospitalized 04/03/2019 - 04/23/2019 for Dissection of aorta, status postreplacement of ascending thoracic aorta with supra coronary graft implantation and distal hemiarch reconstruction with right axillary cannulationon 04/04/2019. Patient had ED visit on 06/01/2019 and 05/13/2019 for chest pain. Patient also has a history of hypertension, tobacco abuse, Breast cancer,radiationtreatment,end-stage renal disease on hemodialysis(Tuesdays,Thursdays,Saturdays ),Chronic kidney disease, stage IV, COPD,atrial fibrillation,Hepatitis C,low back pain,lumbar spinal stenosis, andbone spur.      Assessment: Received [employer] EMMI Stroke Google Alert follow up referral on 06/08/2019, 06/05/2019 and 06/01/2019. Red Flag Alert Trigger, Day #13, patient answered no to the following question: Able to eat and drink?   Red Flag Alert Triggers, Day #9, patient answered yes to the following questions: Feeling worse overall?   New or worsening pain/fever/shortness of breath?    Questions/problems with meds?   Patient answered no the following question:  Able to eat and drink?  Red Flag Alert Triggers, Day #6, patient answered yes to the following questions: Feeling worse overall?New problems walking/talking/speaking/seeing?EMMI follow up completed andwill follow up to assessfurther care management needs.     Plan:RNCM will call patient for telephone outreach attempt within 10 business  days,assess for care management needs, follow up on status of home health services,and proceed with case closure, within 10 business days if no return call.    Kaye Luoma H. Annia Friendly, BSN, [place] Management [facility] Telephonic CM Phone: [phone] Fax: [phone]

## 2019-06-08 NOTE — ED Triage Notes (Signed)
Pt arrives via [facility] from home pt went to dialysis today and finished her treatment however during the treatment she started having CP and dizziness. Ems gave 324 mg asa.

## 2019-06-08 NOTE — ED Provider Notes (Addendum)
[facility] EMERGENCY DEPARTMENT Provider Note   CSN: [id number] Arrival date & time: 06/08/19  1328     History Chief Complaint  Patient presents with  . Chest Pain    Monique Henderson is a 69 y.o. female.  HPI Patient presents to the emergency department with shortness of breath chest pain abdominal pain after dialysis today. Patient states she has this happen to her after dialysis. She states that Threasa Beards is shortness of breath but today she had the chest pain and abdominal pain. Patient states she was given aspirin by EMS. Patient states that her vision did get cloudy as well. The patient states she has all over pain. Patient states she did not take any other medications prior to arrival for symptoms. The patient states that nothing seems to make her condition better or worse. The patient denies headache,blurred vision, neck pain, fever, cough, weakness, numbness, dizziness, anorexia, edema, abdominal pain, nausea, vomiting, diarrhea, rash, back pain, dysuria, hematemesis, bloody stool, near syncope, or syncope.    Past Medical History:  Diagnosis Date  . Atelectasis 2002   Bilateral  . Bone spur 2008   Right calcaneal foot spur  . Breast cancer ([place]) 2004   Ductal carcinoma in situ of the left breast; S/P left partial mastectomy 02/26/2003; S/P re-excision of cranial and lateral margins11/18/2004.radiation  . Breast cancer ([place]) 09/21/2012   right breast/ last radiation treatment 03/22/2013  . Chronic kidney disease, stage IV (severe) ([place]) 10/10/2007  . 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 Autumn Messing; S/P re-excision of superior and inferior margins 10/27/2012.   Marland Kitchen GERD (gastroesophageal reflux disease)   . 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 Hot flashes   . Hx of radiation therapy 2005   left breast  . Hx of radiation therapy 01/11/13- 03/22/13   right breast 5760  cGy 30 sessions  . Hypertension   . Low back pain   . Lumbar spinal stenosis    S/P lumbar decompressive laminectomy, fusion and plating for lumbar spinal stensosis  . Normocytic anemia    With thrombocytosis  . Osteoarthritis   . Personal history of radiation therapy   . Right ureteral stone 2002  . Shortness of breath    from pain  . Uterine fibroid   . Wears dentures    top    Patient Active Problem List   Diagnosis Date Noted  . History of CVA (cerebrovascular accident) without residual deficits 05/29/2019  . On Coumadin for atrial fibrillation ([place]) 05/29/2019  . ESRD on dialysis ([place]) 05/29/2019  . Cerebral thrombosis with cerebral infarction 05/22/2019  . Malnutrition of moderate degree 05/19/2019  . Pleural effusion on left 05/18/2019  . Atrial fibrillation with RVR ([place]) 05/18/2019  . S/P aortic aneurysm repair 04/07/2019  . Aortic dissection ([place]) 04/04/2019  . Dissection of aorta ([place]) 04/03/2019  . Non compliance with medical treatment 12/04/2017  . Chronic kidney disease (CKD) stage G3b/A2, moderately decreased glomerular filtration rate (GFR) between 30-44 mL/min/1.73 square meter and albuminuria creatinine ratio between 30-299 mg/g 07/19/2017  . Chronic obstructive lung disease ([place]) 01/16/2017  . Chronic low back pain 06/22/2016  . Insomnia 03/14/2015  . S/P lumbar spinal fusion 01/18/2014  . Tobacco use disorder 04/19/2009  . Essential hypertension 09/16/2006  . GERD 09/16/2006    Past Surgical History:  Procedure Laterality Date  . ANTERIOR LAT LUMBAR FUSION N/A 01/18/2014  Procedure: ANTERIOR LATERAL LUMBAR FUSION LUMBAR TWO-THREE;  Surgeon: Eustace Moore, MD;  Location: [facility];  Service: Neurosurgery;  Laterality: N/A;  . ANTERIOR LUMBAR FUSION  01/18/2014  . AV FISTULA PLACEMENT Left 04/20/2019   Procedure: ARTERIOVENOUS (AV) FISTULA CREATION;  Surgeon: [name], MD;  Location: [facility];  Service: Vascular;  Laterality: Left;  .  BACK SURGERY    . BREAST LUMPECTOMY Left 01/2003  . BREAST LUMPECTOMY Right 2014  . BREAST LUMPECTOMY WITH NEEDLE LOCALIZATION AND AXILLARY SENTINEL LYMPH NODE BX Right 10/13/2012   Procedure: BREAST LUMPECTOMY WITH NEEDLE LOCALIZATION;  Surgeon: [name], MD;  Location: [facility];  Service: General;  Laterality: Right;  Right breast wire localized lumpectomy  . INSERTION OF DIALYSIS CATHETER Right 04/20/2019   Procedure: INSERTION OF DIALYSIS CATHETER, right internal jugular;  Surgeon: [name], MD;  Location: [facility];  Service: Vascular;  Laterality: Right;  . IR THORACENTESIS ASP PLEURAL SPACE W/IMG GUIDE  05/19/2019  . LAMINECTOMY  05/27/2009   Lumbar decompressive laminectomy, fusion and plating for lumbar spinal stensosis  . LUMBAR LAMINECTOMY/DECOMPRESSION MICRODISCECTOMY Left 03/23/2013   Procedure: LUMBAR LAMINECTOMY/DECOMPRESSION MICRODISCECTOMY 1 LEVEL;  Surgeon: Eustace Moore, MD;  Location: [facility];  Service: Neurosurgery;  Laterality: Left;  LUMBAR LAMINECTOMY/DECOMPRESSION MICRODISCECTOMY 1 LEVEL  . MASTECTOMY, PARTIAL Left 02/26/2003   ; S/P re-excision of cranial and lateral margins 04/19/2003.   Marland Kitchen RE-EXCISION OF BREAST CANCER,SUPERIOR MARGINS Right 10/27/2012   Procedure: RE-EXCISION OF BREAST CANCER,SUPERIOR and inferior MARGINS;  Surgeon: [name], MD;  Location: [facility];  Service: General;  Laterality: Right;  . RE-EXCISION OF BREAST LUMPECTOMY Left 04/2003  . TEE WITHOUT CARDIOVERSION N/A 04/04/2019   Procedure: Transesophageal Echocardiogram (Tee);  Surgeon: Wonda Olds, MD;  Location: [facility];  Service: Open Heart Surgery;  Laterality: N/A;  . THORACIC AORTIC ANEURYSM REPAIR N/A 04/04/2019   Procedure: THORACIC ASCENDING ANEURYSM REPAIR (AAA)  USING 28 MM X 30 CM HEMASHIELD PLATINUM VASCULAR GRAFT;  Surgeon: Wonda Olds, MD;  Location[facility];  Service: Open Heart Surgery;  Laterality: N/A;     OB History    Gravida    2   Para  2   Term  1   Preterm  1   AB      Living        SAB      TAB      Ectopic      Multiple      Live Births              Family History  Problem Relation Age of Onset  . Colon cancer Mother 33  . Hypertension Mother   . Diabetes Sister 88  . Hypertension Sister   . Diabetes Brother   . Hypertension Brother   . Diabetes Brother   . Hypertension Brother   . Kidney disease Son        On dialysis  . Hypertension Son   . Diabetes Son   . Multiple sclerosis Son   . Bone cancer Sister 66  . Breast cancer Neg Hx   . Cervical cancer Neg Hx     Social History   Tobacco Use  . Smoking status: Former Smoker    Packs/day: 0.25    Years: 44.00    Pack years: 11.00    Types: Cigarettes    Quit date: 05/24/2019    Years since quitting: 0.0  .  Smokeless tobacco: Never Used  . Tobacco comment: smoking less  Substance Use Topics  . Alcohol use: Yes    Alcohol/week: 2.0 standard drinks    Types: 2 Cans of beer per week    Comment:  "couple beers q weekend"  . Drug use: Yes    Types: Cocaine    Comment: "stopped back in the 1980's"    Home Medications Prior to Admission medications   Medication Sig Start Date End Date Taking? Authorizing Provider  albuterol (VENTOLIN HFA) 108 (90 Base) MCG/ACT inhaler Inhale 2 puffs into the lungs every 6 (six) hours as needed for wheezing or shortness of breath. 02/02/19   [name], MD  amLODipine (NORVASC) 10 MG tablet TAKE 1 TABLET (10 MG TOTAL) BY MOUTH DAILY. 09/20/18   [name], MD  aspirin EC 81 MG tablet Take 1 tablet (81 mg total) by mouth daily. Until INR > 2. Then stop and continue coumadin only. 05/23/19   Dessa Phi, DO  cloNIDine (CATAPRES - DOSED IN MG/24 HR) 0.3 mg/24hr patch Place 1 patch (0.3 mg total) onto the skin once a week. 04/22/19   Nani Skillern, PA-C  famotidine (PEPCID) 10 MG tablet Take 1 tablet (10 mg total) by mouth at bedtime. 05/04/18   [name], MD   hydrALAZINE (APRESOLINE) 25 MG tablet Take 1 tablet (25 mg total) by mouth every 8 (eight) hours. 04/21/19   Nani Skillern, PA-C  metoprolol tartrate (LOPRESSOR) 50 MG tablet Take 1 tablet (50 mg total) by mouth 2 (two) times daily. 04/21/19   Nani Skillern, PA-C  mirtazapine (REMERON) 7.5 MG tablet Take 1 tablet (7.5 mg total) by mouth at bedtime. 05/29/19   [name], MD  multivitamin (RENA-VIT) TABS tablet Take 1 tablet by mouth at bedtime. 04/21/19   Nani Skillern, PA-C  ondansetron (ZOFRAN) 4 MG tablet Take 1 tablet (4 mg total) by mouth every 8 (eight) hours as needed for nausea or vomiting. Patient not taking: Reported on 06/06/2019 04/22/19   Nani Skillern, PA-C  oxyCODONE-acetaminophen (PERCOCET/ROXICET) 5-325 MG tablet Take 2 tablets by mouth every 6 (six) hours as needed for severe pain. 06/01/19   Horton, Barbette Hair, MD  warfarin (COUMADIN) 2.5 MG tablet Take 1 tablet (2.5 mg total) by mouth daily. 05/23/19 08/21/19  Dessa Phi, DO    Allergies    Shrimp [shellfish allergy], Bactroban [mupirocin], Vicodin [hydrocodone-acetaminophen], Lisinopril, and Tylenol [acetaminophen]  Review of Systems   Review of Systems All other systems negative except as documented in the HPI. All pertinent positives and negatives as reviewed in the HPI. Physical Exam Updated Vital Signs BP (!) 173/76 (BP Location: Right Arm)   Pulse 76   Temp 98.3 F (36.8 C)   Resp 16   SpO2 100%   Physical Exam Vitals and nursing note reviewed.  Constitutional:      General: She is not in acute distress.    Appearance: She is well-developed.  HENT:     Head: Normocephalic and atraumatic.  Eyes:     Pupils: Pupils are equal, round, and reactive to light.  Cardiovascular:     Rate and Rhythm: Normal rate and regular rhythm.     Heart sounds: Normal heart sounds. No murmur. No friction rub. No gallop.   Pulmonary:     Effort: Pulmonary effort is normal. No  respiratory distress.     Breath sounds: Normal breath sounds. No wheezing, rhonchi or rales.  Abdominal:  General: Bowel sounds are normal. There is no distension.     Palpations: Abdomen is soft.     Tenderness: There is no abdominal tenderness.  Musculoskeletal:     Cervical back: Normal range of motion and neck supple.  Skin:    General: Skin is warm and dry.     Capillary Refill: Capillary refill takes less than 2 seconds.     Findings: No erythema or rash.  Neurological:     Mental Status: She is alert and oriented to person, place, and time.     Motor: No abnormal muscle tone.     Coordination: Coordination normal.  Psychiatric:        Behavior: Behavior normal.     ED Results / Procedures / Treatments   Labs (all labs ordered are listed, but only abnormal results are displayed) Labs Reviewed  BASIC METABOLIC PANEL - Abnormal; Notable for the following components:      Result Value   Sodium 132 (*)    Chloride 95 (*)    Glucose, Bld 110 (*)    Creatinine, Ser 2.13 (*)    Calcium 8.2 (*)    GFR calc non Af Amer 24 (*)    GFR calc Af Amer 27 (*)    All other components within normal limits  CBC - Abnormal; Notable for the following components:   Hemoglobin 10.7 (*)    HCT 35.8 (*)    MCHC 29.9 (*)    RDW 17.2 (*)    Platelets 449 (*)    All other components within normal limits  TROPONIN I (HIGH SENSITIVITY) - Abnormal; Notable for the following components:   Troponin I (High Sensitivity) 31 (*)    All other components within normal limits  TROPONIN I (HIGH SENSITIVITY) - Abnormal; Notable for the following components:   Troponin I (High Sensitivity) 32 (*)    All other components within normal limits  CBG MONITORING, ED    EKG EKG Interpretation  Date/Time:  Thursday June 08 2019 13:42:34 EST Ventricular Rate:  73 PR Interval:  176 QRS Duration: 56 QT Interval:  412 QTC Calculation: 453 R Axis:   -11 Text Interpretation: Normal sinus rhythm  Possible Left atrial enlargement Septal infarct , age undetermined Abnormal ECG Confirmed by Gerlene Fee [phone]) on 06/08/2019 8:04:56 PM   Radiology DG Chest 2 View  Result Date: 06/08/2019 CLINICAL DATA:  Chest pain EXAM: CHEST - 2 VIEW COMPARISON:  06/05/2019, 06/01/2019 FINDINGS: Right-sided central venous catheter with tips over the SVC. Post sternotomy changes. Persistent small moderate left pleural effusion with left basilar consolidation. Enlarged cardiomediastinal silhouette. No pneumothorax. IMPRESSION: No significant interval change in small moderate left pleural effusion and left basilar airspace disease, atelectasis versus pneumonia. Stable mild cardiomegaly. Electronically Signed   By: Donavan Foil M.D.   On: 06/08/2019 15:18   CT Angio Chest/Abd/Pel for Dissection W and/or W/WO  Result Date: 06/08/2019 CLINICAL DATA:  Evaluate for aortic dissection. EXAM: CT ANGIOGRAPHY CHEST, ABDOMEN AND PELVIS TECHNIQUE: Multidetector CT imaging through the chest, abdomen and pelvis was performed using the standard protocol during bolus administration of intravenous contrast. Multiplanar reconstructed images and MIPs were obtained and reviewed to evaluate the vascular anatomy. CONTRAST:  197mL OMNIPAQUE IOHEXOL 350 MG/ML SOLN COMPARISON:  May 18, 2019 FINDINGS: CTA CHEST FINDINGS Cardiovascular: Satisfactory opacification of the pulmonary arteries to the segmental level. No evidence of pulmonary embolism. There is moderate severity cardiomegaly. There is a small, stable pericardial effusion. Mediastinum/Nodes: Lungs/Pleura: Multiple sternal wires  are seen. Stable, moderate severity consolidation is seen within the posterior aspect of the left lower lobe. There is a stable small to moderate sized left pleural effusion. No pneumothorax is identified. Musculoskeletal: No chest wall abnormality. No acute or significant osseous findings. Review of the MIP images confirms the above findings. CTA ABDOMEN AND  PELVIS FINDINGS VASCULAR Aorta: Moderate to marked severity calcification at the sclerosis without evidence of aneurysmal dilatation or dissection. Celiac: Patent without evidence of aneurysm, dissection, vasculitis or significant stenosis. SMA: Patent without evidence of aneurysm, dissection, vasculitis or significant stenosis. Renals: Both renal arteries are patent without evidence of aneurysm, dissection, vasculitis, fibromuscular dysplasia or significant stenosis. IMA: Not clearly visualized. Review of the MIP images confirms the above findings. NON-VASCULAR Hepatobiliary: No focal liver abnormality is seen. No gallstones, gallbladder wall thickening, or biliary dilatation. Pancreas: Unremarkable. No pancreatic ductal dilatation or surrounding inflammatory changes. Spleen: Normal in size without focal abnormality. Adrenals/Urinary Tract: Adrenal glands are unremarkable. Kidneys are normal, without renal calculi, focal lesion, or hydronephrosis. Bladder is unremarkable. Stomach/Bowel: Stomach is within normal limits. Appendix appears normal. No evidence of bowel wall thickening, distention, or inflammatory changes. Lymphatic: No enlarged abdominal or pelvic lymph nodes. Reproductive: Uterus and bilateral adnexa are unremarkable. Other: No abdominal wall hernia or abnormality. No abdominopelvic ascites. Musculoskeletal: Multilevel degenerative changes seen throughout the thoracolumbar spine. Postoperative changes seen at the level of L2-L3. Review of the MIP images confirms the above findings. IMPRESSION: 1. Evidence of prior median sternotomy. 2. Persistent moderate severity consolidation throughout the left lower lobe with near complete collapse. 3. Stable small to moderate sized left pleural effusion. 4. No evidence of aortic aneurysm or dissection. 5. Postoperative changes at the level of L2-L3. Electronically Signed   By: Virgina Norfolk M.D.   On: 06/08/2019 21:25    Procedures Procedures (including  critical care time)  Medications Ordered in ED Medications  sodium chloride flush (NS) 0.9 % injection 3 mL (has no administration in time range)  iohexol (OMNIPAQUE) 350 MG/ML injection 100 mL (100 mLs Intravenous Contrast Given 06/08/19 2102)  oxyCODONE (Oxy IR/ROXICODONE) immediate release tablet 5 mg (5 mg Oral Given 06/08/19 2205)    ED Course  I have reviewed the triage vital signs and the nursing notes.  Pertinent labs & imaging results that were available during my care of the patient were reviewed by me and considered in my medical decision making (see chart for details).    MDM Rules/Calculators/A&P                     Patient seems to have this type of episode after dialysis. She is healing better at this time. The patient has been stable here in the emergency department. The patient's troponin is baseline and consistent with her previous. Patient will be advised follow-up with her doctor. Told to return here as needed. The patient vital signs other than being hypertensive have been stable. The patient's dissection study does not show any signs of dissection at this time. Final Clinical Impression(s) / ED Diagnoses Final diagnoses:  None    Rx / DC Orders ED Discharge Orders    None       Dalia Heading, PA-C 06/08/19 2313    Dalia Heading, PA-C 06/08/19 2314    Maudie Flakes, MD 06/09/19 [phone]

## 2019-06-09 ENCOUNTER — Other Ambulatory Visit: Payer: Self-pay | Admitting: *Deleted

## 2019-06-09 NOTE — Patient Outreach (Signed)
Monique Henderson) Care Management  06/09/2019  Monique Henderson 28-Dec-1950 [id number]   Subjective: Telephone call to patient's home / mobile number, spoke with patient, and HIPAA verified.  Patient states she remembers speaking with RNCM earlier this week, advised calling to check on her, received notification that she was sent to the ED on 06/08/2019 after dialysis with chest pain.   Patient states she is doing fine, managing pain, has not ate anything since yesterday, planning to eat some breakfast, still having some chest pain, rating pain 9, had some pain medication yesterday in the  ED which helped pain to ease off but never totally went away.   Patient states she is aware of signs/ symptoms to report, how to reach provider if needed after hours, when to go to ED, and / or call 911. States she has an appointment with cardiologist on 06/21/2019 to evaluate periodic chest pain and shortness of breath.  RNCM advised patient to call  primary MD and cardiologist today to report most recent ED visit, and current symptoms, may also need to obtain earlier appointment, patient voices understanding, is in agreement, and states she will follow up as soon as possible. States she will call RNCM for assistance if needed prior to next patient outreach.   Patient states the home health RN is scheduled to see her this morning for a visit at 10:30am and she planning to report symptoms to home health RN.  Patient states she does not have any education material, EMMI follow up, care coordination, care management, disease monitoring, transportation, community resource, or pharmacy needs at this time.  States she is very appreciative of the follow up and is in agreement to continue to receive [facility] EMMI follow up calls as needed.    Objective:Per KPN (Knowledge Performance Now, point of care tool) and chart review,patient hospitalized 05/18/2019 - 05/24/2019 forPleural effusion on left,Acute  left parietal CVA,Acute toxic metabolic encephalopathy,Malnutrition of moderate degree. Patient hospitalized 04/03/2019 - 04/23/2019 for Dissection of aorta, status postreplacement of ascending thoracic aorta with supra coronary graft implantation and distal hemiarch reconstruction with right axillary cannulationon 04/04/2019. Patient had ED visit on 06/08/2019, 06/01/2019 and 05/13/2019 for chest pain. Patient also has a history of hypertension, tobacco abuse, Breast cancer,radiationtreatment,end-stage renal disease on hemodialysis(Tuesdays,Thursdays,Saturdays ),Chronic kidney disease, stage IV, COPD,atrial fibrillation,Hepatitis C,low back pain,lumbar spinal stenosis, andbone spur.      Assessment: Received [facility] EMMI Stroke [employer] Alert follow up referral on1/11/2019, 06/05/2019 and12/31/2020. Red Flag Alert Trigger, Day #13, patient answered no to the following question: Able to eat and drink?   Red Flag Alert Triggers, Day #9, patient answered yes to the following questions:Feeling worse overall?New or worsening pain/fever/shortness of breath?Questions/problems with meds?Patient answered no the following question:Able to eat and drink?Red Flag Alert Triggers, Day #6, patient answered yes to the following questions: Feeling worse overall?New problems walking/talking/speaking/seeing?EMMI follow up completed andwill follow up to assessfurther care management needs.     Plan:RNCM will call patient for telephone outreach attempt within10business days,assess for care management needs, and proceed with case closure, within 10 business days if no return call.    [name] Friendly, BSN, [facility] Telephonic CM Phone: [phone] Fax: [phone]

## 2019-06-12 ENCOUNTER — Telehealth: Payer: Self-pay

## 2019-06-13 NOTE — Progress Notes (Signed)
Patient referred by [name], MD for chest pain  Subjective:   Monique Henderson, female    DOB: November 11, 1950, 69 y.o.   MRN: [id number]   Chief Complaint  Patient presents with  . Chest Pain  . New Patient (Initial Visit)   HPI  69 y/o [place] American female with h/o type A aortic dissection s/p repair (04/2018), former smoker,paroxysmal atrial fibrillation, h/o stroke (05/2019), moderate b/l carotid artery stenosis, ESRD on HD, hypertension,h/o breast cancer with chest pain.   Patient has had several episodes of chest pain since her surgery. She was most recently seen in [facility] ED on 06/08/2019 with chest pain. Trop was mildly elevated and stable. CTA did not show any PE< but did show cardiomegaly, mild stable pericardial effusion, and aortic atherosclerosis. Chest pain is left upper sided, worse with talking and deep breathing, unrelated to exertion.   She has also had hospital admissions in 05/2019 with similar complaints, and has undergone thoracentesis at least once. Echocardiogram on 05/12/2019 showed severe tricuspid regurgitation and moderate PH. Estimated PASP 44 mmHg. She also had a small acute left pareital infarction in 05/2019, and has been on warfarin for atrial fibrillation.   She has been on dialysis since er surgery. She currently has a Right upper chest trialysis catheter which is being used. She has LUE fistula, which is not being used yet, probably not mature.  Past Medical History:  Diagnosis Date  . Atelectasis 2002   Bilateral  . Bone spur 2008   Right calcaneal foot spur  . Breast cancer ([place]) 2004   Ductal carcinoma in situ of the left breast; S/P left partial mastectomy 02/26/2003; S/P re-excision of cranial and lateral margins11/18/2004.radiation  . Breast cancer ([place]) 09/21/2012   right breast/ last radiation treatment 03/22/2013  . Chronic kidney disease, stage IV (severe) ([place]) 10/10/2007  . 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 Autumn Messing; S/P re-excision of superior and inferior margins 10/27/2012.   Marland Kitchen GERD (gastroesophageal reflux disease)   . 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 Hot flashes   . Hx of radiation therapy 2005   left breast  . Hx of radiation therapy 01/11/13- 03/22/13   right breast 5760 cGy 30 sessions  . Hypertension   . Low back pain   . Lumbar spinal stenosis    S/P lumbar decompressive laminectomy, fusion and plating for lumbar spinal stensosis  . Normocytic anemia    With thrombocytosis  . Osteoarthritis   . Personal history of radiation therapy   . Right ureteral stone 2002  . Shortness of breath    from pain  . Uterine fibroid   . Wears dentures    top     Past Surgical History:  Procedure Laterality Date  . ANTERIOR LAT LUMBAR FUSION N/A 01/18/2014   Procedure: ANTERIOR LATERAL LUMBAR FUSION LUMBAR TWO-THREE;  Surgeon: Eustace Moore, MD;  Location: [place] [facility];  Service: Neurosurgery;  Laterality: N/A;  . ANTERIOR LUMBAR FUSION  01/18/2014  . AV FISTULA PLACEMENT Left 04/20/2019   Procedure: ARTERIOVENOUS (AV) FISTULA CREATION;  Surgeon: [name], MD;  Location: [place];  Service: Vascular;  Laterality: Left;  . BACK SURGERY    . BREAST LUMPECTOMY Left 01/2003  . BREAST LUMPECTOMY Right 2014  . BREAST LUMPECTOMY WITH NEEDLE LOCALIZATION AND AXILLARY SENTINEL LYMPH NODE BX Right 10/13/2012   Procedure:  BREAST LUMPECTOMY WITH NEEDLE LOCALIZATION;  Surgeon: [name], MD;  Location: [place];  Service: General;  Laterality: Right;  Right breast wire localized lumpectomy  . INSERTION OF DIALYSIS CATHETER Right 04/20/2019   Procedure: INSERTION OF DIALYSIS CATHETER, right internal jugular;  Surgeon: [name], MD;  Location: [place];  Service: Vascular;  Laterality: Right;  . IR THORACENTESIS ASP PLEURAL SPACE W/IMG GUIDE  05/19/2019  . LAMINECTOMY   05/27/2009   Lumbar decompressive laminectomy, fusion and plating for lumbar spinal stensosis  . LUMBAR LAMINECTOMY/DECOMPRESSION MICRODISCECTOMY Left 03/23/2013   Procedure: LUMBAR LAMINECTOMY/DECOMPRESSION MICRODISCECTOMY 1 LEVEL;  Surgeon: Eustace Moore, MD;  Location: [place] NEUR[facility] ORS;  Service: Neurosurgery;  Laterality: Left;  LUMBAR LAMINECTOMY/DECOMPRESSION MICRODISCECTOMY 1 LEVEL  . MASTECTOMY, PARTIAL Left 02/26/2003   ; S/P re-excision of cranial and lateral margins 04/19/2003.   Marland Kitchen RE-EXCISION OF BREAST CANCER,SUPERIOR MARGINS Right 10/27/2012   Procedure: RE-EXCISION OF BREAST CANCER,SUPERIOR and inferior MARGINS;  Surgeon: [name], MD;  Location: [place];  Service: General;  Laterality: Right;  . RE-EXCISION OF BREAST LUMPECTOMY Left 04/2003  . TEE WITHOUT CARDIOVERSION N/A 04/04/2019   Procedure: Transesophageal Echocardiogram (Tee);  Surgeon: Wonda Olds, MD;  Location: [place];  Service: Open Heart Surgery;  Laterality: N/A;  . THORACIC AORTIC ANEURYSM REPAIR N/A 04/04/2019   Procedure: THORACIC ASCENDING ANEURYSM REPAIR (AAA)  USING 28 MM X 30 CM HEMASHIELD PLATINUM VASCULAR GRAFT;  Surgeon: Wonda Olds, MD;  [facility];  Service: Open Heart Surgery;  Laterality: N/A;     Social History   Tobacco Use  Smoking Status Former Smoker  . Packs/day: 0.25  . Years: 44.00  . Pack years: 11.00  . Types: Cigarettes  . Quit date: 05/24/2019  . Years since quitting: 0.0  Smokeless Tobacco Never Used  Tobacco Comment   smoking less    Social History   Substance and Sexual Activity  Alcohol Use Yes  . Alcohol/week: 2.0 standard drinks  . Types: 2 Cans of beer per week   Comment:  "couple beers q weekend"     Family History  Problem Relation Age of Onset  . Colon cancer Mother 20  . Hypertension Mother   . Diabetes Sister 84  . Hypertension Sister   . Diabetes Brother   . Hypertension Brother   . Diabetes Brother   . Hypertension Brother   . Kidney  disease Son        On dialysis  . Hypertension Son   . Diabetes Son   . Multiple sclerosis Son   . Bone cancer Sister 73  . Breast cancer Neg Hx   . Cervical cancer Neg Hx      Current Outpatient Medications on File Prior to Visit  Medication Sig Dispense Refill  . albuterol (VENTOLIN HFA) 108 (90 Base) MCG/ACT inhaler Inhale 2 puffs into the lungs every 6 (six) hours as needed for wheezing or shortness of breath. 8 g 5  . amLODipine (NORVASC) 10 MG tablet TAKE 1 TABLET (10 MG TOTAL) BY MOUTH DAILY. 90 tablet 0  . aspirin EC 81 MG tablet Take 1 tablet (81 mg total) by mouth daily. Until INR > 2. Then stop and continue coumadin only. 30 tablet 0  . cloNIDine (CATAPRES - DOSED IN MG/24 HR) 0.3 mg/24hr patch Place 1 patch (0.3 mg total) onto the skin once a week. 4 patch 2  . famotidine (PEPCID) 10 MG tablet Take 1 tablet (10  mg total) by mouth at bedtime. 90 tablet 1  . pregabalin (LYRICA) 75 MG capsule Take 75 mg by mouth 2 (two) times daily.    Marland Kitchen warfarin (COUMADIN) 2.5 MG tablet Take 1 tablet (2.5 mg total) by mouth daily. 30 tablet 2   No current facility-administered medications on file prior to visit.    Cardiovascular and other pertinent studies:  EKG 06/08/2019: Sinus rhythm 73 bpm. Left atrial enlargement.  CTA chest 06/08/2019: 1. Evidence of prior median sternotomy. 2. Persistent moderate severity consolidation throughout the left lower lobe with near complete collapse. 3. Stable small to moderate sized left pleural effusion. 4. No evidence of aortic aneurysm or dissection. 5. Postoperative changes at the level of L2-L3.  Echocardiogram 05/12/2019: Mild LVH. LVEF 65-70%. No WMA. Grade II diastolic dysfunction. Mild RA dilatation. Severe tricuspid regurgitation. Estimated PASP 44 mmHg.   Recent labs:  06/08/2019: Glucose 110, BUN/Cr 11/2.13. EGFR 27. Na/K 132/4.2. Albumin 2.2 (05/2019) H/H 10.7/35.8. MCV 88. Platelets 449 HbA1C 6.0% (04/2019) Chol 124, TG 132, HDL  42, LDL 56 (05/2019)  Results for JANAIYAH, BLACKARD (MRN [id number]) as of 06/13/2019 21:19  Ref. Range 06/01/2019 00:43 06/01/2019 02:43 06/08/2019 13:51 06/08/2019 16:49  B Natriuretic Peptide Latest Ref Range: 0.0 - 100.0 pg/mL 732.8 (H)     Troponin I (High Sensitivity) Latest Ref Range: <18 ng/L 66 (H) 66 (H) 31 (H) 32 (H)     Review of Systems  Cardiovascular: Positive for chest pain and dyspnea on exertion. Negative for leg swelling, palpitations and syncope.        Vitals:   06/14/19 1104  BP: 130/71  Pulse: 65  Temp: 98.2 F (36.8 C)  SpO2: 96%     Body mass index is 20.9 kg/m. Filed Weights   06/14/19 1104  Weight: 118 lb (53.5 kg)     Objective:   Physical Exam  Constitutional: She appears well-developed and well-nourished.  Neck: No JVD present.  Cardiovascular: Normal rate, regular rhythm, normal heart sounds and intact distal pulses.  No murmur heard. Pulmonary/Chest: Effort normal and breath sounds normal. She has no wheezes. She has no rales.  Right upper chest trialysis catheter  Musculoskeletal:        General: No edema.  Nursing note and vitals reviewed.       Assessment & Recommendations:   69 y/o [place] American female with h/o type A aortic dissection s/p repair (04/2018), former smoker,paroxysmal atrial fibrillation, h/o stroke (05/2019), moderate b/l carotid artery stenosis, ESRD on HD, hypertension,h/o breast cancer with chest pain.   Chest pain: Atypical, but has risk factors for CAD and chronically elevated Trop HS. Also has exertional dyspnea. Will obtain Lexiscan/walking nuclear stress test. Chest pain could also be due to pericardial effusion seen on CT chest. Will obtain repeat echocardiogram. Given risk factors for CAD and prior stroke, added Crestor 10 mg.  PAF:  CHA2DS2VASc score 6, annual stroke risk 9%. On warfarin, managed by PCP.   Tricuspid regurgitation: Severe. Clinically asymptomatic.   F/u after tests.  Thank you  for referring the patient to [place]. Please feel free to contact with any questions.  Nigel Mormon, MD [facility] Cardiovascular. [place] Pager: [phone] Office: [phone]

## 2019-06-14 ENCOUNTER — Ambulatory Visit (INDEPENDENT_AMBULATORY_CARE_PROVIDER_SITE_OTHER): Payer: Medicare Other | Admitting: Cardiology

## 2019-06-14 ENCOUNTER — Other Ambulatory Visit: Payer: Self-pay

## 2019-06-14 ENCOUNTER — Encounter: Payer: Self-pay | Admitting: Cardiology

## 2019-06-14 ENCOUNTER — Other Ambulatory Visit: Payer: Self-pay | Admitting: *Deleted

## 2019-06-14 VITALS — BP 130/71 | HR 65 | Temp 98.2°F | Ht 63.0 in | Wt 118.0 lb

## 2019-06-14 DIAGNOSIS — I700 Atherosclerosis of aorta: Secondary | ICD-10-CM

## 2019-06-14 DIAGNOSIS — Z8673 Personal history of transient ischemic attack (TIA), and cerebral infarction without residual deficits: Secondary | ICD-10-CM | POA: Diagnosis not present

## 2019-06-14 DIAGNOSIS — R0789 Other chest pain: Secondary | ICD-10-CM

## 2019-06-14 MED ORDER — ROSUVASTATIN CALCIUM 10 MG PO TABS: 10.0000 mg | ORAL_TABLET | Freq: Every day | ORAL | 3 refills | Status: DC

## 2019-06-14 NOTE — Patient Outreach (Signed)
[facility]) Care Management  06/14/2019  Monique Henderson [birth year] [id number]   Subjective: Telephone call to patient's home  / mobile number, no answer, message states voicemail full, and unable to leave a message.   Objective:Per KPN (Knowledge Performance Now, point of care tool) and chart review,patient hospitalized 05/18/2019 - 05/24/2019 forPleural effusion on left,Acute left parietal CVA,Acute toxic metabolic encephalopathy,Malnutrition of moderate degree. Patient hospitalized 04/03/2019 - 04/23/2019 for Dissection of aorta, status postreplacement of ascending thoracic aorta with supra coronary graft implantation and distal hemiarch reconstruction with right axillary cannulationon 04/04/2019. Patient had ED visit on 06/08/2019, 06/01/2019 and 05/13/2019 for chest pain. Patient also has a history of hypertension, tobacco abuse, Breast cancer,radiationtreatment,end-stage renal disease on hemodialysis(Tuesdays,Thursdays,Saturdays ),Chronic kidney disease, stage IV, COPD,atrial fibrillation,Hepatitis C,low back pain,lumbar spinal stenosis, andbone spur.      Assessment: Received [employer] EMMI Stroke Google Alert follow up referral on1/11/2019,06/05/2019 and12/31/2020. Red Flag Alert Trigger, Day #13, patient answered no to the following question:Able to eat and drink?Red Flag Alert Triggers, Day #9, patient answered yes to the following questions:Feeling worse overall?New or worsening pain/fever/shortness of breath?Questions/problems with meds?Patient answered no the following question:Able to eat and drink?Red Flag Alert Triggers, Day #6, patient answered yes to the following questions: Feeling worse overall?New problems walking/talking/speaking/seeing?EMMI follow up completed andwill follow up to assessfurther care management needs.     Plan:RNCM will call patient for telephone outreach  attempt within4business days,assess for care management needs, and proceed with case closure, within 10 business days if no return call.    Monique Henderson, BSN, [place] Management [facility] Telephonic CM Phone: [phone] Fax: [phone]

## 2019-06-15 ENCOUNTER — Other Ambulatory Visit: Payer: Self-pay | Admitting: *Deleted

## 2019-06-15 NOTE — Patient Outreach (Signed)
[facility]) Care Management  06/15/2019  Monique Henderson December 06, 1950 [id number]   Subjective: Telephone call to patient's home / mobile number, spoke with patient, and HIPAA verified.   Patient states she is doing okay, not a good time to talk, currently eating lunch, and requested call back at a later time.      Objective:Per KPN (Knowledge Performance Now, point of care tool) and chart review,patient hospitalized 05/18/2019 - 05/24/2019 forPleural effusion on left,Acute left parietal CVA,Acute toxic metabolic encephalopathy,Malnutrition of moderate degree. Patient hospitalized 04/03/2019 - 04/23/2019 for Dissection of aorta, status postreplacement of ascending thoracic aorta with supra coronary graft implantation and distal hemiarch reconstruction with right axillary cannulationon 04/04/2019. Patient had ED visit on 06/08/2019, 06/01/2019 and 05/13/2019 for chest pain. Patient also has a history of hypertension, tobacco abuse, Breast cancer,radiationtreatment,end-stage renal disease on hemodialysis(Tuesdays,Thursdays,Saturdays ),Chronic kidney disease, stage IV, COPD,atrial fibrillation,Hepatitis C,low back pain,lumbar spinal stenosis, andbone spur.      Assessment: Received [employer] EMMI Stroke Google Alert follow up referral on1/11/2019,06/05/2019 and12/31/2020. Red Flag Alert Trigger, Day #13, patient answered no to the following question:Able to eat and drink?Red Flag Alert Triggers, Day #9, patient answered yes to the following questions:Feeling worse overall?New or worsening pain/fever/shortness of breath?Questions/problems with meds?Patient answered no the following question:Able to eat and drink?Red Flag Alert Triggers, Day #6, patient answered yes to the following questions: Feeling worse overall?New problems walking/talking/speaking/seeing?EMMI follow up completed andwill follow up to  assessfurther care management needs.     Plan:RNCM will call patient for 3rd telephone outreach attempt within4business days,assess for care management needs, and proceed with case closure, within 10 business days if no return call.    Enmanuel Zufall H. Annia Friendly, BSN, [place] Management [facility] Telephonic CM Phone: [phone] Fax: [phone]

## 2019-06-16 ENCOUNTER — Other Ambulatory Visit: Payer: Self-pay | Admitting: *Deleted

## 2019-06-16 NOTE — Patient Outreach (Signed)
[facility]) Care Management  06/16/2019  Monique Henderson 03/31/1951 [id number]   Subjective: Telephone call to patient's home / mobile number, spoke with patient, and HIPAA verified.  Patient remembers speaking with this RNCM in the past, states she is doing good, currently laying down resting, and is in agreement to brief follow up call.   States she has had a good day, went out riding in the car, running errands with friends, eating well, and currently weighing 118 lbs.  States she is aware of signs / symptoms to monitor for fluid accumulation related to kidney disease.   Patient states she is aware of signs/ symptoms to report, how to reach provider if needed after hours, when to go to ED, and / or call 911.   States she has not been to dialysis (last treatment on 06/08/2019)  this week because every time she goes, she ends up in the ED, and is tired of going to the ED.  States she has not spoken with nephrologist regarding her concerns with dialysis and how it makes her feel to date.    RNCM reiterated  the importance of following up with MD to discuss her concerns, she is aware that not following treatment plan can be viewed as noncompliance, patient voices understanding, and is planning to follow up with MD next week.  Discussed importance of patient's self patient advocacy, assistance available from this RNCM, patient voices understanding, is very appreciative, states no RNCM assistance needed at this time to discuss her concerns with provider, and she will folow up with this RNCM if assistance needed in the future.   Patient states she had a follow up appointment with cardiologist on 06/14/2019, appointment went well, and will have a follow up appointment on 06/28/2019 for a stress test.    States she will assess how she feels tomorrow, will decide if she is going to go to dialysis on 06/17/2019, and will continue to assess on a daily treatment basis.  Patient states she does not have  any education material, EMMI follow up, care coordination, care management, disease monitoring, transportation, community resource, or pharmacy needs at this time. States  she is very appreciative of the follow up, is in agreement to  to receive 1 additional follow up call to assess for further CM needs, and receive [facility] Care Management EMMI follow up calls as needed.     Objective:Per KPN (Knowledge Performance Now, point of care tool) and chart review,patient hospitalized 05/18/2019 - 05/24/2019 forPleural effusion on left,Acute left parietal CVA,Acute toxic metabolic encephalopathy,Malnutrition of moderate degree. Patient hospitalized 04/03/2019 - 04/23/2019 for Dissection of aorta, status postreplacement of ascending thoracic aorta with supra coronary graft implantation and distal hemiarch reconstruction with right axillary cannulationon 04/04/2019. Patient had ED visit on1/11/2019,06/01/2019 and 05/13/2019 for chest pain. Patient also has a history of hypertension, tobacco abuse, Breast cancer,radiationtreatment,end-stage renal disease on hemodialysis(Tuesdays,Thursdays,Saturdays ),Chronic kidney disease, stage IV, COPD,atrial fibrillation,Hepatitis C,low back pain,lumbar spinal stenosis, andbone spur.      Assessment: Received [employer] EMMI Stroke Google Alert follow up referral on1/11/2019,06/05/2019 and12/31/2020. Red Flag Alert Trigger, Day #13, patient answered no to the following question:Able to eat and drink?Red Flag Alert Triggers, Day #9, patient answered yes to the following questions:Feeling worse overall?New or worsening pain/fever/shortness of breath?Questions/problems with meds?Patient answered no the following question:Able to eat and drink?Red Flag Alert Triggers, Day #6, patient answered yes to the following questions: Feeling worse overall?New problems walking/talking/speaking/seeing?EMMI follow up completed  andwill  follow up to assessfurther care management needs.     Plan:RNCM will call patient for telephone outreach attempt within7business days,assess for care management needs, and proceed with case closure, within 10 business days if no return call.    Kilah Drahos H. Annia Friendly, BSN, [place] Management [facility] Telephonic CM Phone: [phone] Fax: [phone]

## 2019-06-19 ENCOUNTER — Other Ambulatory Visit: Payer: Self-pay

## 2019-06-19 NOTE — Progress Notes (Signed)
Pt called and said that her arm was swollen and she felt like it should be looked at.   Called her and advised since she would have a dialysis day tomorrow to have them look at her arm and evaluate and let us know if there was anything that we needed to be doing on our end. Told her that they could let us know if there was a procedure or anything that needed to be done.   She said that she had not been to dialysis in 3 days because of her arm swelling. We discussed the importance of having this done and she said that she will go and have them look at it and call us.   [employer] CMA

## 2019-06-21 ENCOUNTER — Other Ambulatory Visit: Payer: Self-pay | Admitting: Cardiology

## 2019-06-21 ENCOUNTER — Ambulatory Visit: Payer: Self-pay | Admitting: *Deleted

## 2019-06-21 ENCOUNTER — Ambulatory Visit: Payer: Self-pay | Admitting: Cardiology

## 2019-06-21 DIAGNOSIS — I700 Atherosclerosis of aorta: Secondary | ICD-10-CM

## 2019-06-21 DIAGNOSIS — Z8673 Personal history of transient ischemic attack (TIA), and cerebral infarction without residual deficits: Secondary | ICD-10-CM

## 2019-06-21 DIAGNOSIS — R0789 Other chest pain: Secondary | ICD-10-CM

## 2019-06-22 ENCOUNTER — Other Ambulatory Visit: Payer: Self-pay | Admitting: *Deleted

## 2019-06-22 NOTE — Patient Outreach (Addendum)
[facility]) Care Management  06/22/2019  Monique Henderson 1951-02-10 [id number]   Subjective: Telephone call to patient's home / mobile number, spoke with patient, and HIPAA verified.  States she is doing pretty good, just got back from dialysis, did not have to go to ED after dialysis today, and  is planning to go back to dialysis on 06/24/2019.   States she did not go to dialysis on Tuesday  06/20/2019 or Saturday 06/17/2019.  States she spoke with dialysis center provider today regarding left arm dialysis catheter site pain and swelling, provider will contact surgeon for evaluation, possible removal. Patient states she is planning to discuss her concerns with dialysis during her follow up appointment with nephrologist on 1/27/202 and will give this RNCM update on next patient outreach.    Objective:Per KPN (Knowledge Performance Now, point of care tool) and chart review,patient hospitalized 05/18/2019 - 05/24/2019 forPleural effusion on left,Acute left parietal CVA,Acute toxic metabolic encephalopathy,Malnutrition of moderate degree. Patient hospitalized 04/03/2019 - 04/23/2019 for Dissection of aorta, status postreplacement of ascending thoracic aorta with supra coronary graft implantation and distal hemiarch reconstruction with right axillary cannulationon 04/04/2019. Patient had ED visit on1/11/2019,06/01/2019 and 05/13/2019 for chest pain. Patient also has a history of hypertension, tobacco abuse, Breast cancer,radiationtreatment,end-stage renal disease on hemodialysis(Tuesdays,Thursdays,Saturdays ),Chronic kidney disease, stage IV, COPD,atrial fibrillation,Hepatitis C,low back pain,lumbar spinal stenosis, andbone spur.      Assessment: Received [employer] EMMI Stroke Google Alert follow up referral on1/11/2019,06/05/2019 and12/31/2020. Red Flag Alert Trigger, Day #13, patient answered no to the following question:Able to eat  and drink?Red Flag Alert Triggers, Day #9, patient answered yes to the following questions:Feeling worse overall?New or worsening pain/fever/shortness of breath?Questions/problems with meds?Patient answered no the following question:Able to eat and drink?Red Flag Alert Triggers, Day #6, patient answered yes to the following questions: Feeling worse overall?New problems walking/talking/speaking/seeing?EMMI follow up completed andwill follow up to assessfurther care management needs.     Plan:RNCM will call patient for telephone outreach attempt within7business days,assess for care management needs, and proceed with case closure, within 10 business days if no return call.    Jonnie Truxillo H. Annia Friendly, BSN, [place] Management [facility] Telephonic CM Phone: [phone] Fax: [phone]

## 2019-06-26 ENCOUNTER — Encounter: Payer: Self-pay | Admitting: *Deleted

## 2019-06-26 ENCOUNTER — Other Ambulatory Visit: Payer: Self-pay

## 2019-06-26 ENCOUNTER — Ambulatory Visit (INDEPENDENT_AMBULATORY_CARE_PROVIDER_SITE_OTHER): Payer: Self-pay | Admitting: Physician Assistant

## 2019-06-26 VITALS — BP 179/89 | HR 73 | Temp 97.2°F | Resp 14 | Ht 63.0 in | Wt 114.0 lb

## 2019-06-26 DIAGNOSIS — N186 End stage renal disease: Secondary | ICD-10-CM

## 2019-06-26 DIAGNOSIS — Z992 Dependence on renal dialysis: Secondary | ICD-10-CM

## 2019-06-26 NOTE — Progress Notes (Signed)
Patient name: Monique Henderson MRN: [id number] DOB: July 08, 1950 Sex: female  REASON FOR CONSULT: Left upper extremity edema  HPI: Monique Henderson is a 69 y.o. female who presents with worsening left upper extremity edema and pain.  She has a new Left arm brachial artery to cephalic vein AV fistula which was created on 04/20/19 by Dr. Donzetta Matters. She has been having HD on TTS via TVC, which has been working well. The pain and swelling in her left arm started on 06/17/18. She subsequently did not go to dialysis on 1/19 or 1/21 due to her pain but she did return this week on 1/23.  She said it just began suddenly. The pain goes from her shoulder radiating down to left hand to level of PIP joints. She describes the pain as a constant shooting and burning pain as well as pressure. She has been trying to elevate her arm with only minimal improvement of swelling. She complains of some decreased mobility in left hand due to tightness and some numbness in her finger tips but feels that the numbness has been present since prior to her surgery. She otherwise denies any coldness or pain in her fingers.   She is also status postrepair of ascending thoracic aorta dissection on 04/04/2019 by Dr. Orvan Seen. Currently on Coumadin. She does state she has recently been having some neck discomfort as well as chest pressure and shortness of breath on exertion. She was seen on 06/13/18 and has follow up with her Cardiologist on 1/27  Past Medical History:  Diagnosis Date  . Atelectasis 2002   Bilateral  . Bone spur 2008   Right calcaneal foot spur  . Breast cancer ([place]) 2004   Ductal carcinoma in situ of the left breast; S/P left partial mastectomy 02/26/2003; S/P re-excision of cranial and lateral margins11/18/2004.radiation  . Breast cancer ([place]) 09/21/2012   right breast/ last radiation treatment 03/22/2013  . Chronic kidney disease, stage IV (severe) ([place]) 10/10/2007  . 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 Autumn Messing; S/P re-excision of superior and inferior margins 10/27/2012.   Marland Kitchen GERD (gastroesophageal reflux disease)   . 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 Hot flashes   . Hx of radiation therapy 2005   left breast  . Hx of radiation therapy 01/11/13- 03/22/13   right breast 5760 cGy 30 sessions  . Hypertension   . Low back pain   . Lumbar spinal stenosis    S/P lumbar decompressive laminectomy, fusion and plating for lumbar spinal stensosis  . Normocytic anemia    With thrombocytosis  . Osteoarthritis   . Personal history of radiation therapy   . Right ureteral stone 2002  . Shortness of breath    from pain  . Uterine fibroid   . Wears dentures    top   Past Surgical History:  Procedure Laterality Date  . ANTERIOR LAT LUMBAR FUSION N/A 01/18/2014   Procedure: ANTERIOR LATERAL LUMBAR FUSION LUMBAR TWO-THREE;  Surgeon: Eustace Moore, MD;  Location: [place] [facility];  Service: Neurosurgery;  Laterality: N/A;  . ANTERIOR LUMBAR FUSION  01/18/2014  . AV FISTULA PLACEMENT Left 04/20/2019   Procedure: ARTERIOVENOUS (AV) FISTULA CREATION;  Surgeon: [name], MD;  Location: [place];  Service: Vascular;  Laterality: Left;  . BACK SURGERY    . BREAST LUMPECTOMY Left 01/2003  . BREAST LUMPECTOMY Right 2014  .  BREAST LUMPECTOMY WITH NEEDLE LOCALIZATION AND AXILLARY SENTINEL LYMPH NODE BX Right 10/13/2012   Procedure: BREAST LUMPECTOMY WITH NEEDLE LOCALIZATION;  Surgeon: [name], MD;  Location: [place];  Service: General;  Laterality: Right;  Right breast wire localized lumpectomy  . INSERTION OF DIALYSIS CATHETER Right 04/20/2019   Procedure: INSERTION OF DIALYSIS CATHETER, right internal jugular;  Surgeon: [name], MD;  Location: [place];  Service: Vascular;  Laterality: Right;  . IR THORACENTESIS ASP PLEURAL SPACE W/IMG GUIDE  05/19/2019  . LAMINECTOMY   05/27/2009   Lumbar decompressive laminectomy, fusion and plating for lumbar spinal stensosis  . LUMBAR LAMINECTOMY/DECOMPRESSION MICRODISCECTOMY Left 03/23/2013   Procedure: LUMBAR LAMINECTOMY/DECOMPRESSION MICRODISCECTOMY 1 LEVEL;  Surgeon: Eustace Moore, MD;  Location: [facility] [facility];  Service: Neurosurgery;  Laterality: Left;  LUMBAR LAMINECTOMY/DECOMPRESSION MICRODISCECTOMY 1 LEVEL  . MASTECTOMY, PARTIAL Left 02/26/2003   ; S/P re-excision of cranial and lateral margins 04/19/2003.   Marland Kitchen RE-EXCISION OF BREAST CANCER,SUPERIOR MARGINS Right 10/27/2012   Procedure: RE-EXCISION OF BREAST CANCER,SUPERIOR and inferior MARGINS;  Surgeon: [name], MD;  Location: [place];  Service: General;  Laterality: Right;  . RE-EXCISION OF BREAST LUMPECTOMY Left 04/2003  . TEE WITHOUT CARDIOVERSION N/A 04/04/2019   Procedure: Transesophageal Echocardiogram (Tee);  Surgeon: Wonda Olds, MD;  Location: [place];  Service: Open Heart Surgery;  Laterality: N/A;  . THORACIC AORTIC ANEURYSM REPAIR N/A 04/04/2019   Procedure: THORACIC ASCENDING ANEURYSM REPAIR (AAA)  USING 28 MM X 30 CM HEMASHIELD PLATINUM VASCULAR GRAFT;  Surgeon: Wonda Olds, MD;  Location[facility];  Service: Open Heart Surgery;  Laterality: N/A;    Family History  Problem Relation Age of Onset  . Colon cancer Mother 48  . Hypertension Mother   . Diabetes Sister 70  . Hypertension Sister   . Diabetes Brother   . Hypertension Brother   . Diabetes Brother   . Hypertension Brother   . Kidney disease Son        On dialysis  . Hypertension Son   . Diabetes Son   . Multiple sclerosis Son   . Bone cancer Sister 32  . Breast cancer Neg Hx   . Cervical cancer Neg Hx     SOCIAL HISTORY: Social History   Socioeconomic History  . Marital status: Single    Spouse name: Not on file  . Number of children: Not on file  . Years of education: Not on file  . Highest education level: Not on file  Occupational History  . Not on file    Tobacco Use  . Smoking status: Former Smoker    Packs/day: 0.25    Years: 44.00    Pack years: 11.00    Types: Cigarettes    Quit date: 05/24/2019    Years since quitting: 0.0  . Smokeless tobacco: Never Used  . Tobacco comment: smoking less  Substance and Sexual Activity  . Alcohol use: Yes    Alcohol/week: 2.0 standard drinks    Types: 2 Cans of beer per week    Comment:  "couple beers q weekend"  . Drug use: Yes    Types: Cocaine    Comment: "stopped back in the 1980's"  . Sexual activity: Yes    Birth control/protection: Post-menopausal  Other Topics Concern  . Not on file  Social History Narrative  . Not on file   Social Determinants of Health   Financial Resource Strain:   . Difficulty of  Paying Living Expenses: Not on file  Food Insecurity:   . Worried About Charity fundraiser in the Last Year: Not on file  . Ran Out of Food in the Last Year: Not on file  Transportation Needs: No Transportation Needs  . Lack of Transportation (Medical): No  . Lack of Transportation (Non-Medical): No  Physical Activity:   . Days of Exercise per Week: Not on file  . Minutes of Exercise per Session: Not on file  Stress:   . Feeling of Stress : Not on file  Social Connections:   . Frequency of Communication with Friends and Family: Not on file  . Frequency of Social Gatherings with Friends and Family: Not on file  . Attends Religious Services: Not on file  . Active Member of Clubs or Organizations: Not on file  . Attends Archivist Meetings: Not on file  . Marital Status: Not on file  Intimate Partner Violence:   . Fear of Current or Ex-Partner: Not on file  . Emotionally Abused: Not on file  . Physically Abused: Not on file  . Sexually Abused: Not on file    Allergies  Allergen Reactions  . Shrimp [Shellfish Allergy] Shortness Of Breath  . Bactroban [Mupirocin] Other (See Comments)    "Sores in nose"  . Vicodin [Hydrocodone-Acetaminophen] Itching and Nausea  And Vomiting    This is patient's home medication  . Lisinopril Cough  . Tylenol [Acetaminophen] Itching    Current Outpatient Medications  Medication Sig Dispense Refill  . albuterol (VENTOLIN HFA) 108 (90 Base) MCG/ACT inhaler Inhale 2 puffs into the lungs every 6 (six) hours as needed for wheezing or shortness of breath. 8 g 5  . amLODipine (NORVASC) 10 MG tablet TAKE 1 TABLET (10 MG TOTAL) BY MOUTH DAILY. 90 tablet 0  . aspirin EC 81 MG tablet Take 1 tablet (81 mg total) by mouth daily. Until INR > 2. Then stop and continue coumadin only. 30 tablet 0  . cloNIDine (CATAPRES - DOSED IN MG/24 HR) 0.3 mg/24hr patch Place 1 patch (0.3 mg total) onto the skin once a week. 4 patch 2  . famotidine (PEPCID) 10 MG tablet Take 1 tablet (10 mg total) by mouth at bedtime. 90 tablet 1  . rosuvastatin (CRESTOR) 10 MG tablet Take 1 tablet (10 mg total) by mouth daily. 30 tablet 3  . warfarin (COUMADIN) 2.5 MG tablet Take 1 tablet (2.5 mg total) by mouth daily. 30 tablet 2   No current facility-administered medications for this visit.    ROS:   General:  No weight loss, Fever, chills  HEENT: No recent headaches, no nasal bleeding, no visual changes, no sore throat  Neurologic: No dizziness, blackouts, seizures. No recent symptoms of stroke or mini- stroke. No recent episodes of slurred speech, or temporary blindness.  Cardiac:  Recently has had some pressure like chest pain, retrosternal, no shortness of breath at rest. Mild shortness of breath with exertion, she states mostly when going up stairs.  Denies history of atrial fibrillation or irregular heartbeat  Vascular: No history of rest pain in feet.  No history of claudication.  No history of non-healing ulcer, No history of DVT   Pulmonary: No home oxygen, no productive cough, no hemoptysis,  No asthma or wheezing  Musculoskeletal:  [ ]  Arthritis, [ ]  Low back pain,  [ ]  Joint pain  Hematologic:No history of hypercoagulable state.  No  history of easy bleeding.  No history of anemia  Gastrointestinal:  No hematochezia or melena,  No gastroesophageal reflux, no trouble swallowing  Urinary: [ chronic Kidney disease on  HD TTHS, [ ]  Burning with urination, [ ]  Frequent urination, [ ]  Difficulty urinating;   Skin: No rashes  Psychological: No history of anxiety,  No history of depression   Physical Examination  Vitals:   06/26/19 1202  BP: (!) 179/89  Pulse: 73  Resp: 14  Temp: (!) 97.2 F (36.2 C)  TempSrc: Temporal  SpO2: 96%  Weight: 114 lb (51.7 kg)  Height: 5\' 3"  (1.6 m)    Body mass index is 20.19 kg/m.  General:  Alert and oriented, in some discomfort but no acute distress HEENT: Normal Neck: No bruit or JVD Pulmonary: Clear to auscultation bilaterally Cardiac: Regular Rate and Rhythm without murmur, right chest wall TVC, intact, no bleeding Skin: No rash Extremity Pulses: Right 2+ radial, brachial. No swelling. Right hand warm. Left 2+ radial, 2+ brachial, left dorsum of hand edematous, left forearm and distal left upper extremity swelling, tender to palpation from dorsum of hand to left upper arm. Normal range of motion. Mild diminished grip strength left hand. Warm to palpation. No erythema. Left AV fistula, healed and intact. palpable thrill and good bruit Musculoskeletal: No deformity or edema    ASSESSMENT: ESRD patient on HD TTHS S/p left AV fistula with new onset upper extremity swelling and pain.  PLAN:  Will schedule her for a fistulogram to evaluate for possible central venous stenosis. Discussed with patient importance of continuing to go to dialysis in the mean time. She will also keep her follow up with her Cardiologist on 1/27. She will follow up with [place] after her fistulogram  Karoline Caldwell PA-C [facility] Office: [phone] Pager: [phone]   Physician in clinic Dr. Trula Slade

## 2019-06-28 ENCOUNTER — Ambulatory Visit (INDEPENDENT_AMBULATORY_CARE_PROVIDER_SITE_OTHER): Payer: Medicare Other

## 2019-06-28 ENCOUNTER — Other Ambulatory Visit: Payer: Self-pay | Admitting: Physician Assistant

## 2019-06-28 ENCOUNTER — Other Ambulatory Visit: Payer: Self-pay

## 2019-06-28 DIAGNOSIS — Z8673 Personal history of transient ischemic attack (TIA), and cerebral infarction without residual deficits: Secondary | ICD-10-CM

## 2019-06-28 DIAGNOSIS — R0789 Other chest pain: Secondary | ICD-10-CM

## 2019-06-28 DIAGNOSIS — I700 Atherosclerosis of aorta: Secondary | ICD-10-CM

## 2019-06-29 ENCOUNTER — Other Ambulatory Visit (HOSPITAL_COMMUNITY): Payer: Medicare Other

## 2019-06-30 ENCOUNTER — Other Ambulatory Visit: Payer: Self-pay | Admitting: *Deleted

## 2019-06-30 ENCOUNTER — Telehealth: Payer: Self-pay | Admitting: *Deleted

## 2019-06-30 ENCOUNTER — Other Ambulatory Visit: Payer: Self-pay

## 2019-06-30 ENCOUNTER — Other Ambulatory Visit (HOSPITAL_COMMUNITY): Payer: Medicare Other

## 2019-06-30 ENCOUNTER — Telehealth: Payer: Self-pay | Admitting: Cardiology

## 2019-06-30 ENCOUNTER — Telehealth: Payer: Self-pay

## 2019-06-30 ENCOUNTER — Telehealth: Payer: Self-pay | Admitting: Family Medicine

## 2019-06-30 DIAGNOSIS — Z8673 Personal history of transient ischemic attack (TIA), and cerebral infarction without residual deficits: Secondary | ICD-10-CM

## 2019-06-30 DIAGNOSIS — I480 Paroxysmal atrial fibrillation: Secondary | ICD-10-CM

## 2019-06-30 NOTE — Patient Outreach (Addendum)
Monique Henderson) Care Management  06/30/2019  Monique Henderson 1950-09-21 [id number]   Subjective: Telephone call to patient's home / mobile number, spoke with patient, and HIPAA verified.  States she remembers speaking with this RNCM in the past.  States she is doing great, feeling good, has been  going to dialysis as scheduled (every Tuesday, Thursday, Saturday) since 06/22/2019, and is very proud of herself.  Patient states she spoke with nephrologist regarding how she has been feeling with dialysis, feeling after dialysis,  the frequent ER visits after dialysis with chest pain,  and shortness of breath.  States provider changed dialysis treatments from 4 hours to 3 hours and she is able to tolerate the dialysis without any problems.  States she looks forward to going to dialysis now and is feeling so much better overall.  RNCM advised RNCM is also very proud of patient, following up on the patient advocacy suggestions, and finding a solution to her concerns.   Patient states the dialysis center staff and her providers are also very proud of her progress.   States her provider may also try to transition her to 3.5 hours treatment 2 times per week in the future.  States she also received an order for rollator walker and her sister will take her to pick it up in the near future.   Patient statesshe does not have any education material, EMMI follow up, care coordination, care management, disease monitoring, transportation, community resource, or pharmacy needs at this time.States  she is very appreciative of the follow up, is in agreement to  to receive 1 additional follow up call to assess for further CM needs, and receive [facility] Care Management EMMI follow up calls as needed.    Objective:Per KPN (Knowledge Performance Now, point of care tool) and chart review,patient hospitalized 05/18/2019 - 05/24/2019 forPleural effusion on left,Acute left parietal CVA,Acute toxic metabolic  encephalopathy,Malnutrition of moderate degree. Patient hospitalized 04/03/2019 - 04/23/2019 for Dissection of aorta, status postreplacement of ascending thoracic aorta with supra coronary graft implantation and distal hemiarch reconstruction with right axillary cannulationon 04/04/2019. Patient had ED visit on1/11/2019,06/01/2019 and 05/13/2019 for chest pain. Patient also has a history of hypertension, tobacco abuse, Breast cancer,radiationtreatment,end-stage renal disease on hemodialysis(Tuesdays,Thursdays,Saturdays ),Chronic kidney disease, stage IV, COPD,atrial fibrillation,Hepatitis C,low back pain,lumbar spinal stenosis, andbone spur.      Assessment: Received [employer] EMMI Stroke Google Alert follow up referral on1/11/2019,06/05/2019 and12/31/2020. Red Flag Alert Trigger, Day #13, patient answered no to the following question:Able to eat and drink?Red Flag Alert Triggers, Day #9, patient answered yes to the following questions:Feeling worse overall?New or worsening pain/fever/shortness of breath?Questions/problems with meds?Patient answered no the following question:Able to eat and drink?Red Flag Alert Triggers, Day #6, patient answered yes to the following questions: Feeling worse overall?New problems walking/talking/speaking/seeing?EMMI follow up completed andwill follow up to assessfurther care management needs.     Plan:RNCM will call patient for telephone outreach attempt within7business days,assess for care management needs, and proceed with case closure, within 10 business days if no return call.    Monique Tobin H. Monique Henderson, BSN, [place] [facility] Telephonic CM Phone: [phone] Fax: [phone]

## 2019-06-30 NOTE — Telephone Encounter (Signed)
Pt called to see if she is  Taking coumadin  Because she is going to have surgery although she was told that she can't have the surgery because they can not take her off of warfarin (COUMADIN) 2.5 MG tablet [[id number]]    Pt stated that she is not taking that medication anymore .   Please advise if she is suppose to still be taking this medication

## 2019-06-30 NOTE — Telephone Encounter (Signed)
Patient called and notified procedure for fistulogram cancelled for 07/03/2019 per Cardiologist request to delay due to recent stroke and high risk if Coumadin held for this procedure. Recommended to delay for 3 months if possible or admit for inpatient heparin. Dr. Donzetta Matters aware of this message from Dr. Virgina Jock. [place] (Buzz RN) notified of same. Instructed them to contact us to reschedule in 3 months. Patient has [facility] for hemodialysis.  Patient instructed nasal swab cancelled also.

## 2019-06-30 NOTE — Telephone Encounter (Signed)
Addendum to previous note. Patient instructed NO HOLD on Coumadin. Continue to take. Verbalized understanding.

## 2019-06-30 NOTE — Telephone Encounter (Signed)
Faxed completed Medication Discontinuation Clearance Form to Suzan Garibaldi at [facility], fax [phone].  Form signed by MD and indications pt should bridge with heparin.  Confirmation of fax received.  L/m with [employer] to request necessary forms to set pt up for home INR monitoring.  Rep advised they will call us back with info.

## 2019-06-30 NOTE — Telephone Encounter (Signed)
She is at high stroke risk (CHA2DS2VASc score 6 and recent stroke) with interruption of warfarin for AV fistulogram. If this could be accomplished without stopping warfarin, would be ideal. If not, only option is to bridge with her heparin, probably inpatient. Alternatively, consider rescheduling to a later day. While she would remain at inherent risk of stroke with interruption of anticoagulation, 3 months from now is a reasonable timeframe to consider.  Nigel Mormon, MD [facility]. [facility] Pager: [phone] Office: [phone]

## 2019-07-03 ENCOUNTER — Ambulatory Visit (HOSPITAL_COMMUNITY): Admission: RE | Admit: 2019-07-03 | Payer: Medicare Other | Source: Home / Self Care | Admitting: Vascular Surgery

## 2019-07-03 ENCOUNTER — Encounter (HOSPITAL_COMMUNITY): Admission: RE | Payer: Self-pay | Source: Home / Self Care

## 2019-07-03 SURGERY — A/V FISTULAGRAM
Anesthesia: LOCAL

## 2019-07-03 NOTE — Telephone Encounter (Signed)
Please let patient know that being on coumadin protects her from having another stroke. Please schedule patient ASAP. thanks

## 2019-07-04 NOTE — Telephone Encounter (Signed)
Called, informed pt. Of stroke preventing nature of Coumadin, and scheduled appt.

## 2019-07-05 ENCOUNTER — Encounter: Payer: Self-pay | Admitting: Cardiology

## 2019-07-05 ENCOUNTER — Telehealth: Payer: Medicare Other | Admitting: Cardiology

## 2019-07-05 VITALS — Ht 63.0 in | Wt 115.0 lb

## 2019-07-05 DIAGNOSIS — Z8673 Personal history of transient ischemic attack (TIA), and cerebral infarction without residual deficits: Secondary | ICD-10-CM | POA: Diagnosis not present

## 2019-07-05 DIAGNOSIS — R0789 Other chest pain: Secondary | ICD-10-CM

## 2019-07-05 DIAGNOSIS — I480 Paroxysmal atrial fibrillation: Secondary | ICD-10-CM | POA: Diagnosis not present

## 2019-07-05 DIAGNOSIS — I700 Atherosclerosis of aorta: Secondary | ICD-10-CM

## 2019-07-05 DIAGNOSIS — R079 Chest pain, unspecified: Secondary | ICD-10-CM | POA: Insufficient documentation

## 2019-07-05 HISTORY — DX: Other chest pain: R07.89

## 2019-07-05 HISTORY — DX: Atherosclerosis of aorta: I70.0

## 2019-07-05 NOTE — Progress Notes (Addendum)
Patient referred by [name], MD for chest pain  Subjective:   Monique Henderson, female    DOB: 1950-11-12, 69 y.o.   MRN: [id number]  I connected with the patient on 07/05/19 by a telephone call and verified that I am speaking with the correct person using two identifiers.     I offered the patient a video enabled application for a virtual visit. Unfortunately, this could not be accomplished due to technical difficulties/lack of video enabled phone/computer. I discussed the limitations of evaluation and management by telemedicine and the availability of in person appointments. The patient expressed understanding and agreed to proceed.   This visit type was conducted due to national recommendations for restrictions regarding the COVID-19 Pandemic (e.g. social distancing).  This format is felt to be most appropriate for this patient at this time.  All issues noted in this document were discussed and addressed.  No physical exam was performed (except for noted visual exam findings with Tele health visits).  The patient has consented to conduct a Tele health visit and understands insurance will be billed.    Chief Complaint  Patient presents with  . Chest Pain  . Follow-up  . Results    echo and lexi    69 y/o [place] American female with h/o type A aortic dissection s/p repair (04/2018), former smoker,paroxysmal atrial fibrillation, h/o stroke (05/2019), moderate b/l carotid artery stenosis, ESRD on HD, hypertension,h/o breast cancer with chest pain.   Echocardiogram, stress test results discussed with the patient.  She has not had any recurrence of chest pain, shortness of breath.  Her fistula surgery was postponed due to inability to enter warfarin, or bridged with Lovenox at this time.  Initial consultation visit: Patient has had several episodes of chest pain since her surgery. She was most recently seen in [facility] ED on 06/08/2019 with chest pain. Trop was mildly elevated and  stable. CTA did not show any PE< but did show cardiomegaly, mild stable pericardial effusion, and aortic atherosclerosis. Chest pain is left upper sided, worse with talking and deep breathing, unrelated to exertion.   She has also had hospital admissions in 05/2019 with similar complaints, and has undergone thoracentesis at least once. Echocardiogram on 05/12/2019 showed severe tricuspid regurgitation and moderate PH. Estimated PASP 44 mmHg. She also had a small acute left pareital infarction in 05/2019, and has been on warfarin for atrial fibrillation.   She has been on dialysis since er surgery. She currently has a Right upper chest trialysis catheter which is being used. She has LUE fistula, which is not being used yet, probably not mature.   Current Outpatient Medications on File Prior to Visit  Medication Sig Dispense Refill  . albuterol (VENTOLIN HFA) 108 (90 Base) MCG/ACT inhaler Inhale 2 puffs into the lungs every 6 (six) hours as needed for wheezing or shortness of breath. 8 g 5  . amLODipine (NORVASC) 10 MG tablet TAKE 1 TABLET (10 MG TOTAL) BY MOUTH DAILY. 90 tablet 0  . aspirin EC 81 MG tablet Take 1 tablet (81 mg total) by mouth daily. Until INR > 2. Then stop and continue coumadin only. 30 tablet 0  . cloNIDine (CATAPRES - DOSED IN MG/24 HR) 0.3 mg/24hr patch Place 1 patch (0.3 mg total) onto the skin once a week. (Patient taking differently: Place 0.3 mg onto the skin every Saturday. ) 4 patch 2  . hydrALAZINE (APRESOLINE) 25 MG tablet Take 25 mg by mouth 3 (three) times  daily.    . metoprolol tartrate (LOPRESSOR) 50 MG tablet Take 50 mg by mouth 2 (two) times daily.    . ondansetron (ZOFRAN) 4 MG tablet Take 4 mg by mouth every 8 (eight) hours as needed for nausea or vomiting.    Marland Kitchen oxyCODONE-acetaminophen (PERCOCET/ROXICET) 5-325 MG tablet Take 1 tablet by mouth 3 (three) times daily as needed for severe pain (pain.).    Marland Kitchen rosuvastatin (CRESTOR) 10 MG tablet Take 1 tablet (10 mg  total) by mouth daily. 30 tablet 3  . losartan (COZAAR) 50 MG tablet Take 50 mg by mouth at bedtime.    Marland Kitchen warfarin (COUMADIN) 2.5 MG tablet Take 1 tablet (2.5 mg total) by mouth daily. (Patient not taking: Reported on 07/05/2019) 30 tablet 2   No current facility-administered medications on file prior to visit.    Cardiovascular and other pertinent studies:  Lexiscan (Walking with Jaci Carrel) Sestamibi Stress Test 06/28/2019: Nondiagnostic ECG stress. Normal myocardial perfusion. Stress LV EF: 61%.  No previous exam available for comparison. Low risk study.   Echocardiogram 06/28/2019:  Normal LV systolic function with EF 57%. Left ventricle cavity is normal  in size. Moderate concentric hypertrophy of the left ventricle. Normal  global wall motion. Doppler evidence of grade II (pseudonormal) diastolic  dysfunction, elevated LAP. Calculated EF 57%.  Structurally normal aortic valve. Mild (Grade I) aortic regurgitation.  Structurally normal mitral valve. Mild (Grade I) mitral regurgitation.  Normal mitral valve leaflet mobility.  Structurally normal tricuspid valve. Moderate to severe tricuspid  regurgitation directed towards septum. Mild pulmonary hypertension. RVSP  measures 34 mmHg.  No evidence of significant pericardial effusion. Moderate left pleural  Effusion.  EKG 06/08/2019: Sinus rhythm 73 bpm. Left atrial enlargement.  CTA chest 06/08/2019: 1. Evidence of prior median sternotomy. 2. Persistent moderate severity consolidation throughout the left lower lobe with near complete collapse. 3. Stable small to moderate sized left pleural effusion. 4. No evidence of aortic aneurysm or dissection. 5. Postoperative changes at the level of L2-L3.  Echocardiogram 05/12/2019: Mild LVH. LVEF 65-70%. No WMA. Grade II diastolic dysfunction. Mild RA dilatation. Severe tricuspid regurgitation. Estimated PASP 44 mmHg.   Recent labs:  06/08/2019: Glucose 110, BUN/Cr 11/2.13. EGFR 27.  Na/K 132/4.2. Albumin 2.2 (05/2019) H/H 10.7/35.8. MCV 88. Platelets 449 HbA1C 6.0% (04/2019) Chol 124, TG 132, HDL 42, LDL 56 (05/2019)  Results for Monique Henderson, Monique Henderson (MRN [id number]) as of 06/13/2019 21:19  Ref. Range 06/01/2019 00:43 06/01/2019 02:43 06/08/2019 13:51 06/08/2019 16:49  B Natriuretic Peptide Latest Ref Range: 0.0 - 100.0 pg/mL 732.8 (H)     Troponin I (High Sensitivity) Latest Ref Range: <18 ng/L 66 (H) 66 (H) 31 (H) 32 (H)     Review of Systems  Cardiovascular: Negative for chest pain, dyspnea on exertion, leg swelling, palpitations and syncope.       No vitals available.  Body mass index is 20.37 kg/m. Filed Weights   07/05/19 0935  Weight: 115 lb (52.2 kg)     Objective:   Physical Exam  Not performed.  Telephone visit.      Assessment & Recommendations:   69 y/o [place] American female with h/o type A aortic dissection s/p repair (04/2018), former smoker,paroxysmal atrial fibrillation, h/o stroke (05/2019), moderate b/l carotid artery stenosis, ESRD on HD, hypertension,h/o breast cancer with chest pain.   Chest pain: Atypical, now resolved.  Low risk stress test.  Given her recent stroke and aortic atherosclerosis, recommend continuing statin. In absence of clear bleeding risk, reasonable  to continue aspirin.  PAF:  CHA2DS2VASc score 6, annual stroke risk 9%. On warfarin, managed by PCP.  Given her recent stroke, do not recommend interruption of anticoagulation.  Consider postponing vascular surgery to 3 months later.  Tricuspid regurgitation: Severe. Clinically asymptomatic.   I will see her on as-needed basis.  Nigel Mormon, MD [facility] Pager: [phone] Office: [phone]

## 2019-07-06 ENCOUNTER — Ambulatory Visit: Payer: Self-pay | Admitting: *Deleted

## 2019-07-07 ENCOUNTER — Other Ambulatory Visit: Payer: Self-pay | Admitting: *Deleted

## 2019-07-07 ENCOUNTER — Ambulatory Visit: Payer: Medicare Other | Admitting: Family Medicine

## 2019-07-07 NOTE — Patient Outreach (Signed)
[facility]) Care Management  07/07/2019  Monique Henderson 02/23/1951 [id number]   Subjective: Telephone call to patient's home / mobile number, spoke with patient, and HIPAA verified.  Patient states she is doing okay, feeling good, going to dialysis as scheduled, and to all of her follow up appointments as scheduled.  States her dialysis treatments have bee changed to 2 days per week  (Tuesdays / Saturdays) with each treatment lasting 3. 5 hours, is able to tolerate this regimen, and has had no ED visits.   RNCM congratulated patient on following up with her provider, and establishing a treatment plan that is working for her.  Patient states she is appreciative of the support, is very proud of self, and now looks forward to going to dialysis.    States she is eating better, gaining weight per MD's recommendations, and has gained 15 pounds. States her next primary MD appointment is on 07/10/2019 and her cardiologist appointment was on 07/05/2019,  went well.  RNCM advised patient 30 day EMMI Stroke automated calls follow up is completed and follow up goals met. Patient states she does not have any education material, EMMI follow up, care coordination, care management, disease monitoring, transportation, community resource, or pharmacy needs at this time. 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       Objective:Per KPN (Knowledge Performance Now, point of care tool) and chart review,patient hospitalized 05/18/2019 - 05/24/2019 forPleural effusion on left,Acute left parietal CVA,Acute toxic metabolic encephalopathy,Malnutrition of moderate degree. Patient hospitalized 04/03/2019 - 04/23/2019 for Dissection of aorta, status postreplacement of ascending thoracic aorta with supra coronary graft implantation and distal hemiarch reconstruction with right axillary cannulationon 04/04/2019. Patient had ED visit on1/11/2019,06/01/2019 and  05/13/2019 for chest pain. Patient also has a history of hypertension, tobacco abuse, Breast cancer,radiationtreatment,end-stage renal disease on hemodialysis(Tuesdays,Thursdays,Saturdays ),Chronic kidney disease, stage IV, COPD,atrial fibrillation,Hepatitis C,low back pain,lumbar spinal stenosis, andbone spur.      Assessment: Received [employer] EMMI Stroke Google Alert follow up referral on1/11/2019,06/05/2019 and12/31/2020. Red Flag Alert Trigger, Day #13, patient answered no to the following question:Able to eat and drink?Red Flag Alert Triggers, Day #9, patient answered yes to the following questions:Feeling worse overall?New or worsening pain/fever/shortness of breath?Questions/problems with meds?Patient answered no the following question:Able to eat and drink?Red Flag Alert Triggers, Day #6, patient answered yes to the following questions: Feeling worse overall?New problems walking/talking/speaking/seeing?EMMI follow up completed and no further care management needs.      Plan:RNCM will complete case closure due to follow up completed / no care management needs.     Devann Cribb H. Annia Friendly, BSN, [place] Management [facility] Telephonic CM Phone: [phone] Fax: [phone]

## 2019-07-10 ENCOUNTER — Other Ambulatory Visit: Payer: Self-pay

## 2019-07-10 ENCOUNTER — Ambulatory Visit (INDEPENDENT_AMBULATORY_CARE_PROVIDER_SITE_OTHER): Payer: Medicare Other | Admitting: Family Medicine

## 2019-07-10 ENCOUNTER — Encounter: Payer: Self-pay | Admitting: Family Medicine

## 2019-07-10 VITALS — BP 122/74 | HR 79 | Temp 97.7°F | Ht 63.0 in | Wt 106.8 lb

## 2019-07-10 DIAGNOSIS — Z992 Dependence on renal dialysis: Secondary | ICD-10-CM

## 2019-07-10 DIAGNOSIS — I10 Essential (primary) hypertension: Secondary | ICD-10-CM | POA: Diagnosis not present

## 2019-07-10 DIAGNOSIS — Z7901 Long term (current) use of anticoagulants: Secondary | ICD-10-CM

## 2019-07-10 DIAGNOSIS — Z8673 Personal history of transient ischemic attack (TIA), and cerebral infarction without residual deficits: Secondary | ICD-10-CM

## 2019-07-10 DIAGNOSIS — N186 End stage renal disease: Secondary | ICD-10-CM | POA: Diagnosis not present

## 2019-07-10 DIAGNOSIS — I4891 Unspecified atrial fibrillation: Secondary | ICD-10-CM

## 2019-07-10 MED ORDER — WARFARIN SODIUM 2.5 MG PO TABS: 2.5000 mg | ORAL_TABLET | Freq: Every day | ORAL | 2 refills | Status: DC

## 2019-07-10 NOTE — Progress Notes (Signed)
2/8/20212:45 PM  Monique Henderson December 21, 1950, 69 y.o., female [id number]  Chief Complaint  Patient presents with  . Hypertension    follow up, having swelling in the left arm and hand. Was told that she may have to have surgery on it, however due to the coumadin no surgery scheduled. Pt says she has not taken that med in 2 months. Having sob after dialysis. Often has to be taken to er for tx    HPI:   Patient is a 69 y.o. female with past medical history significant for HTN, afib, CVA dec 2020, Aortic dissection, cardiac arrest, ESRD on HD, GERD, chronic low back pain s/p lumbar fusion, bilateral breast cancer s/p radiation, HCV s/p treatment/cure who presents today for followup  Last OV dec 2020 - telemedicine fistulogram rescheduled for march given high risk of restroke if coumadin held  Patient overall doing much better since last OV Dialysis T/Th/Sat - fluid removal decreased and now tolerating HD Driving herself for treatments appetite is back to normal  Taking warfarin every night as prescribed, 2.5mg  Denies any abnormal bleeding or bruising She is not sure if her INR is being check at HD as originally intended  She has no acute concerns today  Recently had stress test and echo with cards, normal NM stress test. G2DD, mod-severe TR, mild pHTN, mod left pleural effusion Cards started statin - she is tolerating it well Cards recommends cont ASA if no increase bleeding risk F/u prn    Depression screen [facility] 2/9 07/10/2019 05/29/2019 02/24/2019  Decreased Interest 0 0 0  Down, Depressed, Hopeless 0 0 0  PHQ - 2 Score 0 0 0  Some recent data might be hidden    Fall Risk  07/10/2019 05/29/2019 02/24/2019 05/04/2018 12/22/2017  Falls in the past year? 0 0 0 0 No  Number falls in past yr: 0 0 0 0 -  Injury with Fall? 0 0 0 0 -  Risk for fall due to : - - - - -  Risk for fall due to: Comment - - - - -  Follow up - Falls evaluation completed - - -     Allergies  Allergen  Reactions  . Shrimp [Shellfish Allergy] Shortness Of Breath  . Bactroban [Mupirocin] Other (See Comments)    "Sores in nose"  . Vicodin [Hydrocodone-Acetaminophen] Itching and Nausea And Vomiting    This is patient's home medication  . Lisinopril Cough  . Tylenol [Acetaminophen] Itching    Prior to Admission medications   Medication Sig Start Date End Date Taking? Authorizing Provider  albuterol (VENTOLIN HFA) 108 (90 Base) MCG/ACT inhaler Inhale 2 puffs into the lungs every 6 (six) hours as needed for wheezing or shortness of breath. 02/02/19  Yes [name], MD  amLODipine (NORVASC) 10 MG tablet TAKE 1 TABLET (10 MG TOTAL) BY MOUTH DAILY. 09/20/18  Yes [name], MD  aspirin EC 81 MG tablet Take 1 tablet (81 mg total) by mouth daily. Until INR > 2. Then stop and continue coumadin only. 05/23/19  Yes Dessa Phi, DO  cloNIDine (CATAPRES - DOSED IN MG/24 HR) 0.3 mg/24hr patch Place 1 patch (0.3 mg total) onto the skin once a week. Patient taking differently: Place 0.3 mg onto the skin every Saturday.  04/22/19  Yes Lars Pinks M, PA-C  hydrALAZINE (APRESOLINE) 25 MG tablet Take 25 mg by mouth 3 (three) times daily.   Yes [provider]  losartan (COZAAR) 50 MG tablet Take 50  mg by mouth at bedtime. 07/04/19  Yes [provider]  metoprolol tartrate (LOPRESSOR) 50 MG tablet Take 50 mg by mouth 2 (two) times daily.   Yes [provider]  ondansetron (ZOFRAN) 4 MG tablet Take 4 mg by mouth every 8 (eight) hours as needed for nausea or vomiting.   Yes [provider]  oxyCODONE-acetaminophen (PERCOCET/ROXICET) 5-325 MG tablet Take 1 tablet by mouth 3 (three) times daily as needed for severe pain (pain.).   Yes [provider]  rosuvastatin (CRESTOR) 10 MG tablet Take 1 tablet (10 mg total) by mouth daily. 06/14/19 09/12/19 Yes Patwardhan, Manish J, MD  warfarin (COUMADIN) 2.5 MG tablet Take 1 tablet (2.5 mg total) by mouth daily.  05/23/19 08/21/19 Yes Dessa Phi, DO    Past Medical History:  Diagnosis Date  . Atelectasis 2002   Bilateral  . Bone spur 2008   Right calcaneal foot spur  . Breast cancer ([place]) 2004   Ductal carcinoma in situ of the left breast; S/P left partial mastectomy 02/26/2003; S/P re-excision of cranial and lateral margins11/18/2004.radiation  . Breast cancer ([place]) 09/21/2012   right breast/ last radiation treatment 03/22/2013  . Chronic kidney disease, stage IV (severe) ([place]) 10/10/2007  . 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 Autumn Messing; S/P re-excision of superior and inferior margins 10/27/2012.   Marland Kitchen GERD (gastroesophageal reflux disease)   . 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 Hot flashes   . Hx of radiation therapy 2005   left breast  . Hx of radiation therapy 01/11/13- 03/22/13   right breast 5760 cGy 30 sessions  . Hypertension   . Low back pain   . Lumbar spinal stenosis    S/P lumbar decompressive laminectomy, fusion and plating for lumbar spinal stensosis  . Normocytic anemia    With thrombocytosis  . Osteoarthritis   . Personal history of radiation therapy   . Right ureteral stone 2002  . Shortness of breath    from pain  . Uterine fibroid   . Wears dentures    top    Past Surgical History:  Procedure Laterality Date  . ANTERIOR LAT LUMBAR FUSION N/A 01/18/2014   Procedure: ANTERIOR LATERAL LUMBAR FUSION LUMBAR TWO-THREE;  Surgeon: Eustace Moore, MD;  Location: [place] [facility];  Service: Neurosurgery;  Laterality: N/A;  . ANTERIOR LUMBAR FUSION  01/18/2014  . AV FISTULA PLACEMENT Left 04/20/2019   Procedure: ARTERIOVENOUS (AV) FISTULA CREATION;  Surgeon: [name], MD;  Location: [place];  Service: Vascular;  Laterality: Left;  . BACK SURGERY    . BREAST LUMPECTOMY Left 01/2003  . BREAST LUMPECTOMY Right 2014  . BREAST LUMPECTOMY WITH NEEDLE LOCALIZATION AND AXILLARY  SENTINEL LYMPH NODE BX Right 10/13/2012   Procedure: BREAST LUMPECTOMY WITH NEEDLE LOCALIZATION;  Surgeon: [name], MD;  Location: [place];  Service: General;  Laterality: Right;  Right breast wire localized lumpectomy  . INSERTION OF DIALYSIS CATHETER Right 04/20/2019   Procedure: INSERTION OF DIALYSIS CATHETER, right internal jugular;  Surgeon: [name], MD;  Location: [place];  Service: Vascular;  Laterality: Right;  . IR THORACENTESIS ASP PLEURAL SPACE W/IMG GUIDE  05/19/2019  . LAMINECTOMY  05/27/2009   Lumbar decompressive laminectomy, fusion and plating for lumbar spinal stensosis  . LUMBAR LAMINECTOMY/DECOMPRESSION MICRODISCECTOMY Left 03/23/2013   Procedure: LUMBAR LAMINECTOMY/DECOMPRESSION MICRODISCECTOMY 1 LEVEL;  Surgeon: Shanon Brow  Adah Salvage, MD;  Location: [place] [facility];  Service: Neurosurgery;  Laterality: Left;  LUMBAR LAMINECTOMY/DECOMPRESSION MICRODISCECTOMY 1 LEVEL  . MASTECTOMY, PARTIAL Left 02/26/2003   ; S/P re-excision of cranial and lateral margins 04/19/2003.   Marland Kitchen RE-EXCISION OF BREAST CANCER,SUPERIOR MARGINS Right 10/27/2012   Procedure: RE-EXCISION OF BREAST CANCER,SUPERIOR and inferior MARGINS;  Surgeon: [name], MD;  Location: [place];  Service: General;  Laterality: Right;  . RE-EXCISION OF BREAST LUMPECTOMY Left 04/2003  . TEE WITHOUT CARDIOVERSION N/A 04/04/2019   Procedure: Transesophageal Echocardiogram (Tee);  Surgeon: Wonda Olds, MD;  Location: [place];  Service: Open Heart Surgery;  Laterality: N/A;  . THORACIC AORTIC ANEURYSM REPAIR N/A 04/04/2019   Procedure: THORACIC ASCENDING ANEURYSM REPAIR (AAA)  USING 28 MM X 30 CM HEMASHIELD PLATINUM VASCULAR GRAFT;  Surgeon: Wonda Olds, MD;  Location: [facility];  Service: Open Heart Surgery;  Laterality: N/A;    Social History   Tobacco Use  . Smoking status: Former Smoker    Packs/day: 0.25    Years: 44.00    Pack years: 11.00    Types: Cigarettes    Quit date:  05/24/2019    Years since quitting: 0.1  . Smokeless tobacco: Never Used  . Tobacco comment: smoking less  Substance Use Topics  . Alcohol use: Yes    Alcohol/week: 2.0 standard drinks    Types: 2 Cans of beer per week    Comment:  "couple beers q weekend"    Family History  Problem Relation Age of Onset  . Colon cancer Mother 71  . Hypertension Mother   . Diabetes Sister 72  . Hypertension Sister   . Diabetes Brother   . Hypertension Brother   . Diabetes Brother   . Hypertension Brother   . Kidney disease Son        On dialysis  . Hypertension Son   . Diabetes Son   . Multiple sclerosis Son   . Bone cancer Sister 63  . Breast cancer Neg Hx   . Cervical cancer Neg Hx     Review of Systems  Constitutional: Negative for chills and fever.  Respiratory: Positive for shortness of breath (intermittent, DOE, improving). Negative for cough.   Cardiovascular: Negative for chest pain, palpitations and leg swelling.  Gastrointestinal: Negative for abdominal pain, nausea and vomiting.     OBJECTIVE:  Today's Vitals   07/10/19 1435 07/10/19 1516  BP: (!) 170/92 122/74  Pulse: 79   Temp: 97.7 F (36.5 C)   SpO2: 100%   Weight: 106 lb 12.8 oz (48.4 kg)   Height: 5\' 3"  (1.6 m)    Body mass index is 18.92 kg/m.  Wt Readings from Last 3 Encounters:  07/10/19 106 lb 12.8 oz (48.4 kg)  07/05/19 115 lb (52.2 kg)  06/26/19 114 lb (51.7 kg)    Physical Exam Vitals and nursing note reviewed.  Constitutional:      Appearance: She is well-developed.  HENT:     Head: Normocephalic and atraumatic.     Mouth/Throat:     Pharynx: No oropharyngeal exudate.  Eyes:     General: No scleral icterus.    Conjunctiva/sclera: Conjunctivae normal.     Pupils: Pupils are equal, round, and reactive to light.  Cardiovascular:     Rate and Rhythm: Rhythm irregularly irregular.     Heart sounds: Murmur present. No friction rub. No gallop.   Pulmonary:     Effort: Pulmonary effort is  normal.  Breath sounds: Normal breath sounds. No wheezing or rales.  Musculoskeletal:     Cervical back: Neck supple.  Skin:    General: Skin is warm and dry.  Neurological:     Mental Status: She is alert and oriented to person, place, and time.     No results found for this or any previous visit (from the past 24 hour(s)).  No results found.   ASSESSMENT and PLAN  1. On Coumadin for atrial fibrillation ([place]) Checking labs today, medications will be adjusted as needed. Ordering home INR monitoring thru [employer].  - Protime-INR  2. ESRD on dialysis [facility]) HD on T/Th/Sat, fistulogram pending for march given recent CVA  3. Essential hypertension Controlled. Continue current regime.  - Care order/instruction:  4. History of CVA (cerebrovascular accident) without residual deficits 2/2 afib, on coumadin  Other orders - warfarin (COUMADIN) 2.5 MG tablet; Take 1 tablet (2.5 mg total) by mouth daily.  Return in about 3 months (around 10/07/2019).    [name], MD Primary Care at [facility] [place], [place] [address] Ph.  [phone] Fax [phone]

## 2019-07-11 LAB — PROTIME-INR
INR: 1.2 (ref 0.9–1.2)
Prothrombin Time: 12.4 s — ABNORMAL HIGH (ref 9.1–12.0)

## 2019-07-11 MED ORDER — WARFARIN SODIUM 2.5 MG PO TABS: ORAL_TABLET | ORAL | 2 refills | Status: DC

## 2019-07-11 NOTE — Addendum Note (Signed)
Addended by: [name] on: 07/11/2019 01:54 PM   Modules accepted: Orders

## 2019-07-12 ENCOUNTER — Encounter: Payer: Self-pay | Admitting: Neurology

## 2019-07-12 ENCOUNTER — Ambulatory Visit (INDEPENDENT_AMBULATORY_CARE_PROVIDER_SITE_OTHER): Payer: Medicare Other | Admitting: Neurology

## 2019-07-12 ENCOUNTER — Other Ambulatory Visit: Payer: Self-pay

## 2019-07-12 VITALS — BP 198/102 | HR 75 | Temp 97.3°F | Ht 63.0 in | Wt 108.0 lb

## 2019-07-12 DIAGNOSIS — I6381 Other cerebral infarction due to occlusion or stenosis of small artery: Secondary | ICD-10-CM | POA: Diagnosis not present

## 2019-07-12 NOTE — Progress Notes (Signed)
[facility] [address] [place]. [place] [address] [phone]       OFFICE FOLLOW-UP NOTE  [name] Date of Birth:  28-Mar-1951 Medical Record Number:  [id number]   HPI: 69 year old African-American lady seen today for initial office follow-up visit following hospital consultation for stroke in December 2021.  History is obtained from the patient, review of electronic medical records and I personally reviewed imaging films in PACS.  Patient was admitted following shortness of breath after dialysis on 05/17/2020 and was found to have large left-sided pleural effusion and underwent thoracocentesis on 05/19/2019 which yielded 320 cc of serosanguineous fluid following this patient was found to have sudden change in level of consciousness and was not found to be responding to questions and state often fell asleep and gradually got better.  CT scan was obtained which showed multiple remote rib strokes but no acute findings.  She was thought to have had a seizure and was given Ativan and fosphenytoin however EEG did not show evidence of seizure activity.  Eventually MRI scan was obtained which showed a small left parietal white matter infarct.  MRI of the brain showed mild irregularities of intracranial vessels but no large vessel stenosis or occlusion.  Transthoracic echo showed normal ejection fraction.  Carotid ultrasound showed bilateral 40 to 59% extracranial carotid stenosis.  LDL cholesterol 56 mg percent.  Patient was found to have atrial fibrillation and was started on warfarin.  Patient states she is done well since discharge.  She has had no further episodes of stroke or TIAs.  She remains on warfarin and her INR has been stable.  She states her blood pressure is normal is much better though today it is elevated in office at 198/102.  She has no new complaints.  Her left arm was swollen and her AV fistula is probably thrombosed and will need to be replaced.  ROS:     14 system review of systems is positive for left arm pain and swelling and all other systems negative PMH:  Past Medical History:  Diagnosis Date  . Atelectasis 2002   Bilateral  . Bone spur 2008   Right calcaneal foot spur  . Breast cancer ([place]) 2004   Ductal carcinoma in situ of the left breast; S/P left partial mastectomy 02/26/2003; S/P re-excision of cranial and lateral margins11/18/2004.radiation  . Breast cancer ([place]) 09/21/2012   right breast/ last radiation treatment 03/22/2013  . Chronic kidney disease, stage IV (severe) ([place]) 10/10/2007  . 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 Autumn Messing; S/P re-excision of superior and inferior margins 10/27/2012.   Marland Kitchen GERD (gastroesophageal reflux disease)   . 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 Hot flashes   . Hx of radiation therapy 2005   left breast  . Hx of radiation therapy 01/11/13- 03/22/13   right breast 5760 cGy 30 sessions  . Hypertension   . Low back pain   . Lumbar spinal stenosis    S/P lumbar decompressive laminectomy, fusion and plating for lumbar spinal stensosis  . Normocytic anemia    With thrombocytosis  . Osteoarthritis   . Personal history of radiation therapy   . Right ureteral stone 2002  . Shortness of breath    from pain  . Uterine fibroid   . Wears dentures    top    Social History:  Social History   Socioeconomic  History  . Marital status: Single    Spouse name: Not on file  . Number of children: Not on file  . Years of education: Not on file  . Highest education level: Not on file  Occupational History  . Not on file  Tobacco Use  . Smoking status: Former Smoker    Packs/day: 0.25    Years: 44.00    Pack years: 11.00    Types: Cigarettes    Quit date: 05/24/2019    Years since quitting: 0.1  . Smokeless tobacco: Never Used  . Tobacco comment: smoking less  Substance and Sexual Activity  . Alcohol  use: Yes    Alcohol/week: 2.0 standard drinks    Types: 2 Cans of beer per week    Comment:  "couple beers q weekend"  . Drug use: Yes    Types: Cocaine    Comment: "stopped back in the 1980's"  . Sexual activity: Yes    Birth control/protection: Post-menopausal  Other Topics Concern  . Not on file  Social History Narrative  . Not on file   Social Determinants of Health   Financial Resource Strain:   . Difficulty of Paying Living Expenses: Not on file  Food Insecurity:   . Worried About Charity fundraiser in the Last Year: Not on file  . Ran Out of Food in the Last Year: Not on file  Transportation Needs: No Transportation Needs  . Lack of Transportation (Medical): No  . Lack of Transportation (Non-Medical): No  Physical Activity:   . Days of Exercise per Week: Not on file  . Minutes of Exercise per Session: Not on file  Stress:   . Feeling of Stress : Not on file  Social Connections:   . Frequency of Communication with Friends and Family: Not on file  . Frequency of Social Gatherings with Friends and Family: Not on file  . Attends Religious Services: Not on file  . Active Member of Clubs or Organizations: Not on file  . Attends Archivist Meetings: Not on file  . Marital Status: Not on file  Intimate Partner Violence:   . Fear of Current or Ex-Partner: Not on file  . Emotionally Abused: Not on file  . Physically Abused: Not on file  . Sexually Abused: Not on file    Medications:   Current Outpatient Medications on File Prior to Visit  Medication Sig Dispense Refill  . albuterol (VENTOLIN HFA) 108 (90 Base) MCG/ACT inhaler Inhale 2 puffs into the lungs every 6 (six) hours as needed for wheezing or shortness of breath. 8 g 5  . amLODipine (NORVASC) 10 MG tablet TAKE 1 TABLET (10 MG TOTAL) BY MOUTH DAILY. 90 tablet 0  . aspirin EC 81 MG tablet Take 1 tablet (81 mg total) by mouth daily. Until INR > 2. Then stop and continue coumadin only. 30 tablet 0  .  cloNIDine (CATAPRES - DOSED IN MG/24 HR) 0.3 mg/24hr patch Place 1 patch (0.3 mg total) onto the skin once a week. (Patient taking differently: Place 0.3 mg onto the skin every Saturday. ) 4 patch 2  . hydrALAZINE (APRESOLINE) 25 MG tablet Take 25 mg by mouth 3 (three) times daily.    Marland Kitchen losartan (COZAAR) 50 MG tablet Take 50 mg by mouth at bedtime.    . metoprolol tartrate (LOPRESSOR) 50 MG tablet Take 50 mg by mouth 2 (two) times daily.    . ondansetron (ZOFRAN) 4 MG tablet Take 4 mg by mouth  every 8 (eight) hours as needed for nausea or vomiting.    Marland Kitchen oxyCODONE-acetaminophen (PERCOCET/ROXICET) 5-325 MG tablet Take 1 tablet by mouth 3 (three) times daily as needed for severe pain (pain.).    Marland Kitchen rosuvastatin (CRESTOR) 10 MG tablet Take 1 tablet (10 mg total) by mouth daily. 30 tablet 3  . warfarin (COUMADIN) 2.5 MG tablet Alternate 1 tab (2.5mg ) with 2 tabs (5 mg) every other day. 30 tablet 2   No current facility-administered medications on file prior to visit.    Allergies:   Allergies  Allergen Reactions  . Shrimp [Shellfish Allergy] Shortness Of Breath  . Bactroban [Mupirocin] Other (See Comments)    "Sores in nose"  . Vicodin [Hydrocodone-Acetaminophen] Itching and Nausea And Vomiting    This is patient's home medication  . Lisinopril Cough  . Tylenol [Acetaminophen] Itching    Physical Exam General: Frail cachectic looking middle-aged African-American lady, seated, in no evident distress Head: head normocephalic and atraumatic.  Neck: supple with no carotid or supraclavicular bruits Cardiovascular: regular rate and rhythm, soft ejection systolic murmur. Musculoskeletal: no deformity Skin:  no rash/petichiae Vascular:  Normal pulses all extremities except left upper extremity which is swollen and has AV fistula Vitals:   07/12/19 1429  Pulse: 75  Temp: (!) 97.3 F (36.3 C)   Neurologic Exam Mental Status: Awake and fully alert. Oriented to place and time. Recent and remote  memory intact. Attention span, concentration and fund of knowledge appropriate. Mood and affect appropriate.  Cranial Nerves: Fundoscopic exam not done. Pupils equal, briskly reactive to light. Extraocular movements full without nystagmus. Visual fields full to confrontation. Hearing intact. Facial sensation intact. Face, tongue, palate moves normally and symmetrically.  Motor: Normal bulk and tone. Normal strength in all tested extremity muscles. Sensory.: intact to touch ,pinprick .position and vibratory sensation.  Coordination: Rapid alternating movements normal in all extremities. Finger-to-nose and heel-to-shin performed accurately bilaterally. Gait and Station: Arises from chair without difficulty. Stance is normal. Gait demonstrates normal stride length and balance . Able to heel, toe and tandem walk with mild difficulty.  Reflexes: 1+ and symmetric. Toes downgoing.   NIHSS  0 Modified Rankin 1   ASSESSMENT: 69 year old lady with small left parietal white matter infarct in December 2021 in the setting of atrial fibrillation.  Multiple vascular risk factors of hypertension,, age and atrial fibrillation     PLAN: I had a long d/w patient about her recent  Lacunarstroke,atrial fibrillation, risk for recurrent stroke/TIAs, personally independently reviewed imaging studies and stroke evaluation results and answered questions.Continue warfarin daily  for secondary stroke prevention and maintain strict control of hypertension with blood pressure goal below 130/90, diabetes with hemoglobin A1c goal below 6.5% and lipids with LDL cholesterol goal below 70 mg/dL. I also advised the patient to eat a healthy diet with plenty of whole grains, cereals, fruits and vegetables, exercise regularly and maintain ideal body weight Followup in the future with my nurse practitioner Janett Billow in 6 months or call earlier if necessary. Greater than 50% of time during this 35 minute visit was spent on  counseling,explanation of diagnosis of lacunar stroke, atrial fibrillation, planning of further management, discussion with patient and family and coordination of care Antony Contras, MD Note: This document was prepared with digital dictation and possible smart phrase technology. Any transcriptional errors that result from this process are unintentional

## 2019-07-12 NOTE — Patient Instructions (Signed)
I had a long d/w patient about her recent  Lacunarstroke,atrial fibrillation, risk for recurrent stroke/TIAs, personally independently reviewed imaging studies and stroke evaluation results and answered questions.Continue warfarin daily  for secondary stroke prevention and maintain strict control of hypertension with blood pressure goal below 130/90, diabetes with hemoglobin A1c goal below 6.5% and lipids with LDL cholesterol goal below 70 mg/dL. I also advised the patient to eat a healthy diet with plenty of whole grains, cereals, fruits and vegetables, exercise regularly and maintain ideal body weight Followup in the future with my nurse practitioner Janett Billow in 6 months or call earlier if necessary.  Stroke Prevention Some medical conditions and behaviors are associated with a higher chance of having a stroke. You can help prevent a stroke by making nutrition, lifestyle, and other changes, including managing any medical conditions you may have. What nutrition changes can be made?   Eat healthy foods. You can do this by: ? Choosing foods high in fiber, such as fresh fruits and vegetables and whole grains. ? Eating at least 5 or more servings of fruits and vegetables a day. Try to fill half of your plate at each meal with fruits and vegetables. ? Choosing lean protein foods, such as lean cuts of meat, poultry without skin, fish, tofu, beans, and nuts. ? Eating low-fat dairy products. ? Avoiding foods that are high in salt (sodium). This can help lower blood pressure. ? Avoiding foods that have saturated fat, trans fat, and cholesterol. This can help prevent high cholesterol. ? Avoiding processed and premade foods.  Follow your health care provider's specific guidelines for losing weight, controlling high blood pressure (hypertension), lowering high cholesterol, and managing diabetes. These may include: ? Reducing your daily calorie intake. ? Limiting your daily sodium intake to 1,500 milligrams  (mg). ? Using only healthy fats for cooking, such as olive oil, canola oil, or sunflower oil. ? Counting your daily carbohydrate intake. What lifestyle changes can be made?  Maintain a healthy weight. Talk to your health care provider about your ideal weight.  Get at least 30 minutes of moderate physical activity at least 5 days a week. Moderate activity includes brisk walking, biking, and swimming.  Do not use any products that contain nicotine or tobacco, such as cigarettes and e-cigarettes. If you need help quitting, ask your health care provider. It may also be helpful to avoid exposure to secondhand smoke.  Limit alcohol intake to no more than 1 drink a day for nonpregnant women and 2 drinks a day for men. One drink equals 12 oz of beer, 5 oz of wine, or 1 oz of hard liquor.  Stop any illegal drug use.  Avoid taking birth control pills. Talk to your health care provider about the risks of taking birth control pills if: ? You are over 19 years old. ? You smoke. ? You get migraines. ? You have ever had a blood clot. What other changes can be made?  Manage your cholesterol levels. ? Eating a healthy diet is important for preventing high cholesterol. If cholesterol cannot be managed through diet alone, you may also need to take medicines. ? Take any prescribed medicines to control your cholesterol as told by your health care provider.  Manage your diabetes. ? Eating a healthy diet and exercising regularly are important parts of managing your blood sugar. If your blood sugar cannot be managed through diet and exercise, you may need to take medicines. ? Take any prescribed medicines to control your diabetes as  told by your health care provider.  Control your hypertension. ? To reduce your risk of stroke, try to keep your blood pressure below 130/80. ? Eating a healthy diet and exercising regularly are an important part of controlling your blood pressure. If your blood pressure cannot  be managed through diet and exercise, you may need to take medicines. ? Take any prescribed medicines to control hypertension as told by your health care provider. ? Ask your health care provider if you should monitor your blood pressure at home. ? Have your blood pressure checked every year, even if your blood pressure is normal. Blood pressure increases with age and some medical conditions.  Get evaluated for sleep disorders (sleep apnea). Talk to your health care provider about getting a sleep evaluation if you snore a lot or have excessive sleepiness.  Take over-the-counter and prescription medicines only as told by your health care provider. Aspirin or blood thinners (antiplatelets or anticoagulants) may be recommended to reduce your risk of forming blood clots that can lead to stroke.  Make sure that any other medical conditions you have, such as atrial fibrillation or atherosclerosis, are managed. What are the warning signs of a stroke? The warning signs of a stroke can be easily remembered as BEFAST.  B is for balance. Signs include: ? Dizziness. ? Loss of balance or coordination. ? Sudden trouble walking.  E is for eyes. Signs include: ? A sudden change in vision. ? Trouble seeing.  F is for face. Signs include: ? Sudden weakness or numbness of the face. ? The face or eyelid drooping to one side.  A is for arms. Signs include: ? Sudden weakness or numbness of the arm, usually on one side of the body.  S is for speech. Signs include: ? Trouble speaking (aphasia). ? Trouble understanding.  T is for time. ? These symptoms may represent a serious problem that is an emergency. Do not wait to see if the symptoms will go away. Get medical help right away. Call your local emergency services (911 in the [place] Do not drive yourself to the hospital.  Other signs of stroke may include: ? A sudden, severe headache with no known cause. ? Nausea or vomiting. ? Seizure. Where to  find more information For more information, visit:  American Stroke Association: www.strokeassociation.org  National Stroke Association: www.stroke.org Summary  You can prevent a stroke by eating healthy, exercising, not smoking, limiting alcohol intake, and managing any medical conditions you may have.  Do not use any products that contain nicotine or tobacco, such as cigarettes and e-cigarettes. If you need help quitting, ask your health care provider. It may also be helpful to avoid exposure to secondhand smoke.  Remember BEFAST for warning signs of stroke. Get help right away if you or a loved one has any of these signs. This information is not intended to replace advice given to you by your health care provider. Make sure you discuss any questions you have with your health care provider. Document Revised: 04/30/2017 Document Reviewed: 06/23/2016 Elsevier Patient Education  2020 [employer].

## 2019-07-13 ENCOUNTER — Encounter: Payer: Self-pay | Admitting: Family Medicine

## 2019-07-13 LAB — POCT INR: INR: 1.6 — AB (ref 2.0–3.0)

## 2019-07-17 ENCOUNTER — Other Ambulatory Visit: Payer: Self-pay | Admitting: Family Medicine

## 2019-07-17 DIAGNOSIS — Z1231 Encounter for screening mammogram for malignant neoplasm of breast: Secondary | ICD-10-CM

## 2019-07-25 ENCOUNTER — Telehealth: Payer: Self-pay | Admitting: Family Medicine

## 2019-07-25 ENCOUNTER — Other Ambulatory Visit: Payer: Self-pay | Admitting: Physician Assistant

## 2019-07-31 ENCOUNTER — Encounter: Payer: Medicare Other | Admitting: Family Medicine

## 2019-07-31 ENCOUNTER — Other Ambulatory Visit: Payer: Self-pay | Admitting: Family Medicine

## 2019-07-31 ENCOUNTER — Other Ambulatory Visit: Payer: Self-pay

## 2019-07-31 NOTE — Telephone Encounter (Signed)
Requested medication (s) are due for refill today: yes  Requested medication (s) are on the active medication list: coumadin is   Last refill:  coumadin 07/11/19  Metoprolol 06/26/19  Promethazine 03/09/19  Future visit scheduled: yes  Notes to clinic:  metoprolol and promethazine are hx meds and provider NT not delegated to refill coumadin or promethazine   Requested Prescriptions  Pending Prescriptions Disp Refills   warfarin (COUMADIN) 2.5 MG tablet 30 tablet 2    Sig: Alternate 1 tab (2.5mg ) with 2 tabs (5 mg) every other day.      Hematology:  Anticoagulants - warfarin Failed - 07/31/2019 12:51 PM      Failed - This refill cannot be delegated      Failed - If the patient is managed by Coumadin Clinic - route to their Pool. If not, forward to the provider.      Passed - INR in normal range and within 30 days    INR  Date Value Ref Range Status  07/10/2019 1.2 0.9 - 1.2 Final    Comment:    Reference interval is for non-anticoagulated patients. Suggested INR therapeutic range for Vitamin K antagonist therapy:    Standard Dose (moderate intensity                   therapeutic range):       2.0 - 3.0    Higher intensity therapeutic range       2.5 - 3.5           Passed - Valid encounter within last 3 months    Recent Outpatient Visits           3 weeks ago On Coumadin for atrial fibrillation [facility])   Primary Care at [facility], Lilia Argue, MD   2 months ago ESRD on dialysis [facility])   Primary Care at [facility], Lilia Argue, MD   5 months ago Essential hypertension   Primary Care at [facility], Lilia Argue, MD   1 year ago Medicare annual wellness visit, initial   Primary Care at [facility], Laurey Arrow, MD   1 year ago Chronic kidney disease (CKD) stage G3b/A2, moderately decreased glomerular filtration rate (GFR) between 30-44 mL/min/1.73 square meter and albuminuria creatinine ratio between 30-299 mg/g [facility])   Primary Care at [facility], Laurey Arrow, MD       Future  Appointments             Today [name], MD Primary Care at [place], [facility]   In 3 weeks [name], MD Primary Care at [place], [facility]              metoprolol tartrate (LOPRESSOR) 50 MG tablet 60 tablet 2    Sig: Take 1 tablet (50 mg total) by mouth 2 (two) times daily.      Cardiovascular:  Beta Blockers Failed - 07/31/2019 12:51 PM      Failed - Last BP in normal range    BP Readings from Last 1 Encounters:  07/12/19 (!) 198/102          Passed - Last Heart Rate in normal range    Pulse Readings from Last 1 Encounters:  07/12/19 75          Passed - Valid encounter within last 6 months    Recent Outpatient Visits           3 weeks ago On Coumadin for atrial fibrillation [facility])   Primary Care  at [facility], Lilia Argue, MD   2 months ago ESRD on dialysis [facility])   Primary Care at [facility], Lilia Argue, MD   5 months ago Essential hypertension   Primary Care at [facility], Lilia Argue, MD   1 year ago Medicare annual wellness visit, initial   Primary Care at [facility], Laurey Arrow, MD   1 year ago Chronic kidney disease (CKD) stage G3b/A2, moderately decreased glomerular filtration rate (GFR) between 30-44 mL/min/1.73 square meter and albuminuria creatinine ratio between 30-299 mg/g [facility])   Primary Care at [facility], Laurey Arrow, MD       Future Appointments             Today [name], MD Primary Care at [place], [facility]   In 3 weeks [name], MD Primary Care at [place], [facility]              ondansetron (ZOFRAN) 4 MG tablet 20 tablet     Sig: Take 1 tablet (4 mg total) by mouth every 8 (eight) hours as needed for nausea or vomiting.      Not Delegated - Gastroenterology: Antiemetics Failed - 07/31/2019 12:51 PM      Failed - This refill cannot be delegated      Passed - Valid encounter within last 6 months    Recent Outpatient Visits           3 weeks ago On Coumadin for atrial fibrillation [facility])   Primary Care at [facility], Lilia Argue, MD   2  months ago ESRD on dialysis [facility])   Primary Care at [facility], Lilia Argue, MD   5 months ago Essential hypertension   Primary Care at [facility], Lilia Argue, MD   1 year ago Medicare annual wellness visit, initial   Primary Care at [facility], Laurey Arrow, MD   1 year ago Chronic kidney disease (CKD) stage G3b/A2, moderately decreased glomerular filtration rate (GFR) between 30-44 mL/min/1.73 square meter and albuminuria creatinine ratio between 30-299 mg/g [facility])   Primary Care at [facility], Laurey Arrow, MD       Future Appointments             Today [name], MD Primary Care at [place], [facility]   In 3 weeks [name], MD Primary Care at [place], [facility]

## 2019-07-31 NOTE — Patient Instructions (Signed)
° ° ° °  If you have lab work done today you will be contacted with your lab results within the next 2 weeks.  If you have not heard from us then please contact us. The fastest way to get your results is to register for My Chart. ° ° °IF you received an x-ray today, you will receive an invoice from [facility]. Please contact [facility] at [phone] with questions or concerns regarding your invoice.  ° °IF you received labwork today, you will receive an invoice from [facility]. Please contact [facility] at [phone] with questions or concerns regarding your invoice.  ° °Our billing staff will not be able to assist you with questions regarding bills from these companies. ° °You will be contacted with the lab results as soon as they are available. The fastest way to get your results is to activate your My Chart account. Instructions are located on the last page of this paperwork. If you have not heard from us regarding the results in 2 weeks, please contact this office. °  ° ° ° °

## 2019-07-31 NOTE — Progress Notes (Signed)
Called patient but had she was no longer at home and unable to have visit  This encounter was created in error - please disregard.

## 2019-07-31 NOTE — Telephone Encounter (Signed)
Patient is also requesting Renavite, which is not on the current med list. Metoprolol and and Ondansetron were previously prescribed by the hospital physician. Pt is out of the requested medications.

## 2019-07-31 NOTE — Telephone Encounter (Signed)
Pt called stating that she was prescribed medications in the hospital and that the doctor told her he would not prescribe her any refills. Pt is requesting to know if she should have them refilled. The medications metoprolol, warfarin, ondansetron, and renavite. Pt states she is completely out of these medications. Please advise.     [place], [place]  [place] [address]  Phone: [phone] Fax: [phone]  Not a 24 hour pharmacy; exact hours not known.

## 2019-08-02 ENCOUNTER — Other Ambulatory Visit: Payer: Self-pay

## 2019-08-02 ENCOUNTER — Other Ambulatory Visit: Payer: Self-pay | Admitting: Physician Assistant

## 2019-08-02 NOTE — Telephone Encounter (Signed)
Patches, renadite   Patient would like to have refills on medications that seem to be different from the ones being requested below, I was not able to make out the meds she was requesting   Please advise     [place], [place] get med refills  She said she spoke to Dr.Santiago yesterday 08/01/2019

## 2019-08-03 ENCOUNTER — Other Ambulatory Visit: Payer: Self-pay | Admitting: Family Medicine

## 2019-08-03 MED ORDER — ONDANSETRON HCL 4 MG PO TABS: 4.0000 mg | ORAL_TABLET | Freq: Three times a day (TID) | ORAL | 3 refills | Status: DC | PRN

## 2019-08-03 MED ORDER — METOPROLOL TARTRATE 50 MG PO TABS: 50.0000 mg | ORAL_TABLET | Freq: Two times a day (BID) | ORAL | 2 refills | Status: DC

## 2019-08-03 NOTE — Telephone Encounter (Signed)
Requested medication (s) are due for refill today: yes  Requested medication (s) are on the active medication list: no  Last refill: ?  Future visit scheduled: yes  Notes to clinic:  d/d'd 06/14/19    Requested Prescriptions  Pending Prescriptions Disp Refills   mirtazapine (REMERON) 7.5 MG tablet [Pharmacy Med Name: MIRTAZAPINE 7.5MG  TABLET] 30 tablet 2    Sig: TAKE 1 TABLET (7.5 MG TOTAL) BY MOUTH AT BEDTIME.      Psychiatry: Antidepressants - mirtazapine Failed - 08/03/2019  9:35 AM      Failed - AST in normal range and within 360 days    AST  Date Value Ref Range Status  05/20/2019 11 (L) 15 - 41 U/L Final  09/28/2012 35 (H) 5 - 34 U/L Final          Passed - ALT in normal range and within 360 days    ALT  Date Value Ref Range Status  05/20/2019 8 0 - 44 U/L Final  12/09/2016 23 6 - 29 U/L Final  09/28/2012 41 0 - 55 U/L Final          Passed - Triglycerides in normal range and within 360 days    Triglycerides  Date Value Ref Range Status  05/22/2019 132 <150 mg/dL Final          Passed - Total Cholesterol in normal range and within 360 days    Cholesterol, Total  Date Value Ref Range Status  02/24/2019 156 100 - 199 mg/dL Final   Cholesterol  Date Value Ref Range Status  05/22/2019 124 0 - 200 mg/dL Final          Passed - WBC in normal range and within 360 days    WBC  Date Value Ref Range Status  06/08/2019 7.9 4.0 - 10.5 K/uL Final          Passed - Valid encounter within last 6 months    Recent Outpatient Visits           3 weeks ago On Coumadin for atrial fibrillation [facility])   [facility] at [facility], Lilia Argue, MD   2 months ago ESRD on dialysis [facility])   [facility] at [facility], Lilia Argue, MD   5 months ago Essential hypertension   [facility] at [facility], Lilia Argue, MD   1 year ago Medicare annual wellness visit, initial   Primary Care at [facility], Laurey Arrow, MD   1 year ago Chronic kidney disease (CKD) stage G3b/A2,  moderately decreased glomerular filtration rate (GFR) between 30-44 mL/min/1.73 square meter and albuminuria creatinine ratio between 30-299 mg/g [facility])   Primary [facility] at [facility], Laurey Arrow, MD       Future Appointments             In 3 weeks [name], MD Primary Care at [place], [facility]

## 2019-08-03 NOTE — Telephone Encounter (Signed)
Patient is requesting a refill of the following medications: Requested Prescriptions   Pending Prescriptions Disp Refills  . mirtazapine (REMERON) 7.5 MG tablet [Pharmacy Med Name: MIRTAZAPINE 7.5MG  TABLET] 30 tablet 2    Sig: TAKE 1 TABLET (7.5 MG TOTAL) BY MOUTH AT BEDTIME.    Date of patient request: 08/03/2019 Last office visit: 07/10/2019 Date of last refill: 05/29/2019 Last refill amount: 30 tablets  Follow up time period per chart: Follow up appointment scheduled

## 2019-08-15 DIAGNOSIS — I639 Cerebral infarction, unspecified: Secondary | ICD-10-CM | POA: Diagnosis not present

## 2019-08-15 DIAGNOSIS — I770 Arteriovenous fistula, acquired: Secondary | ICD-10-CM | POA: Diagnosis not present

## 2019-08-15 DIAGNOSIS — N184 Chronic kidney disease, stage 4 (severe): Secondary | ICD-10-CM | POA: Diagnosis not present

## 2019-08-15 DIAGNOSIS — N390 Urinary tract infection, site not specified: Secondary | ICD-10-CM | POA: Diagnosis not present

## 2019-08-15 DIAGNOSIS — D631 Anemia in chronic kidney disease: Secondary | ICD-10-CM | POA: Diagnosis not present

## 2019-08-15 DIAGNOSIS — I129 Hypertensive chronic kidney disease with stage 1 through stage 4 chronic kidney disease, or unspecified chronic kidney disease: Secondary | ICD-10-CM | POA: Diagnosis not present

## 2019-08-16 DIAGNOSIS — G894 Chronic pain syndrome: Secondary | ICD-10-CM | POA: Diagnosis not present

## 2019-08-16 DIAGNOSIS — Z79899 Other long term (current) drug therapy: Secondary | ICD-10-CM | POA: Diagnosis not present

## 2019-08-16 DIAGNOSIS — M5136 Other intervertebral disc degeneration, lumbar region: Secondary | ICD-10-CM | POA: Diagnosis not present

## 2019-08-17 ENCOUNTER — Ambulatory Visit: Payer: Medicare Other

## 2019-08-21 DIAGNOSIS — Z452 Encounter for adjustment and management of vascular access device: Secondary | ICD-10-CM | POA: Diagnosis not present

## 2019-08-25 ENCOUNTER — Ambulatory Visit: Payer: Medicare Other | Admitting: Family Medicine

## 2019-08-27 ENCOUNTER — Observation Stay (HOSPITAL_COMMUNITY)
Admission: EM | Admit: 2019-08-27 | Discharge: 2019-08-30 | Disposition: A | Discharge status: Home / Self Care | Payer: Medicare Other | Attending: Internal Medicine | Admitting: Internal Medicine

## 2019-08-27 ENCOUNTER — Other Ambulatory Visit: Payer: Self-pay

## 2019-08-27 ENCOUNTER — Emergency Department (HOSPITAL_COMMUNITY): Payer: Medicare Other

## 2019-08-27 ENCOUNTER — Encounter (HOSPITAL_COMMUNITY): Payer: Self-pay | Admitting: Emergency Medicine

## 2019-08-27 DIAGNOSIS — Z20822 Contact with and (suspected) exposure to covid-19: Secondary | ICD-10-CM | POA: Diagnosis not present

## 2019-08-27 DIAGNOSIS — Z8673 Personal history of transient ischemic attack (TIA), and cerebral infarction without residual deficits: Secondary | ICD-10-CM | POA: Diagnosis not present

## 2019-08-27 DIAGNOSIS — R079 Chest pain, unspecified: Secondary | ICD-10-CM | POA: Diagnosis not present

## 2019-08-27 DIAGNOSIS — Z853 Personal history of malignant neoplasm of breast: Secondary | ICD-10-CM | POA: Diagnosis not present

## 2019-08-27 DIAGNOSIS — R6510 Systemic inflammatory response syndrome (SIRS) of non-infectious origin without acute organ dysfunction: Secondary | ICD-10-CM | POA: Diagnosis present

## 2019-08-27 DIAGNOSIS — Z87891 Personal history of nicotine dependence: Secondary | ICD-10-CM | POA: Diagnosis not present

## 2019-08-27 DIAGNOSIS — N184 Chronic kidney disease, stage 4 (severe): Secondary | ICD-10-CM | POA: Diagnosis present

## 2019-08-27 DIAGNOSIS — I499 Cardiac arrhythmia, unspecified: Secondary | ICD-10-CM | POA: Diagnosis not present

## 2019-08-27 DIAGNOSIS — Z79899 Other long term (current) drug therapy: Secondary | ICD-10-CM | POA: Insufficient documentation

## 2019-08-27 DIAGNOSIS — I471 Supraventricular tachycardia, unspecified: Secondary | ICD-10-CM | POA: Diagnosis present

## 2019-08-27 DIAGNOSIS — I480 Paroxysmal atrial fibrillation: Secondary | ICD-10-CM | POA: Diagnosis not present

## 2019-08-27 DIAGNOSIS — R0789 Other chest pain: Secondary | ICD-10-CM | POA: Diagnosis not present

## 2019-08-27 DIAGNOSIS — R778 Other specified abnormalities of plasma proteins: Secondary | ICD-10-CM | POA: Diagnosis present

## 2019-08-27 DIAGNOSIS — J449 Chronic obstructive pulmonary disease, unspecified: Secondary | ICD-10-CM | POA: Diagnosis not present

## 2019-08-27 DIAGNOSIS — Z743 Need for continuous supervision: Secondary | ICD-10-CM | POA: Diagnosis not present

## 2019-08-27 DIAGNOSIS — I491 Atrial premature depolarization: Secondary | ICD-10-CM | POA: Diagnosis not present

## 2019-08-27 DIAGNOSIS — I129 Hypertensive chronic kidney disease with stage 1 through stage 4 chronic kidney disease, or unspecified chronic kidney disease: Secondary | ICD-10-CM | POA: Diagnosis not present

## 2019-08-27 DIAGNOSIS — E875 Hyperkalemia: Secondary | ICD-10-CM | POA: Diagnosis not present

## 2019-08-27 DIAGNOSIS — Z7982 Long term (current) use of aspirin: Secondary | ICD-10-CM | POA: Insufficient documentation

## 2019-08-27 DIAGNOSIS — J90 Pleural effusion, not elsewhere classified: Secondary | ICD-10-CM

## 2019-08-27 DIAGNOSIS — R7989 Other specified abnormal findings of blood chemistry: Secondary | ICD-10-CM | POA: Diagnosis present

## 2019-08-27 DIAGNOSIS — Z7901 Long term (current) use of anticoagulants: Secondary | ICD-10-CM | POA: Diagnosis not present

## 2019-08-27 HISTORY — DX: Systemic inflammatory response syndrome (sirs) of non-infectious origin without acute organ dysfunction: R65.10

## 2019-08-27 HISTORY — DX: Supraventricular tachycardia: I47.1

## 2019-08-27 LAB — BASIC METABOLIC PANEL
Anion gap: 11 (ref 5–15)
BUN: 37 mg/dL — ABNORMAL HIGH (ref 8–23)
CO2: 25 mmol/L (ref 22–32)
Calcium: 8.9 mg/dL (ref 8.9–10.3)
Chloride: 102 mmol/L (ref 98–111)
Creatinine, Ser: 3.16 mg/dL — ABNORMAL HIGH (ref 0.44–1.00)
GFR calc Af Amer: 17 mL/min — ABNORMAL LOW (ref >60)
GFR calc non Af Amer: 15 mL/min — ABNORMAL LOW (ref >60)
Glucose, Bld: 126 mg/dL — ABNORMAL HIGH (ref 70–99)
Potassium: 5.4 mmol/L — ABNORMAL HIGH (ref 3.5–5.1)
Sodium: 138 mmol/L (ref 135–145)

## 2019-08-27 LAB — DIFFERENTIAL
Abs Immature Granulocytes: 0.03 10*3/uL (ref 0.00–0.07)
Basophils Absolute: 0 10*3/uL (ref 0.0–0.1)
Basophils Relative: 1 %
Eosinophils Absolute: 0.1 10*3/uL (ref 0.0–0.5)
Eosinophils Relative: 2 %
Immature Granulocytes: 0 %
Lymphocytes Relative: 15 %
Lymphs Abs: 1.1 10*3/uL (ref 0.7–4.0)
Monocytes Absolute: 0.6 10*3/uL (ref 0.1–1.0)
Monocytes Relative: 8 %
Neutro Abs: 5.5 10*3/uL (ref 1.7–7.7)
Neutrophils Relative %: 74 %

## 2019-08-27 LAB — CBC
HCT: 36.7 % (ref 36.0–46.0)
Hemoglobin: 10.9 g/dL — ABNORMAL LOW (ref 12.0–15.0)
MCH: 24.1 pg — ABNORMAL LOW (ref 26.0–34.0)
MCHC: 29.7 g/dL — ABNORMAL LOW (ref 30.0–36.0)
MCV: 81 fL (ref 80.0–100.0)
Platelets: 288 10*3/uL (ref 150–400)
RBC: 4.53 MIL/uL (ref 3.87–5.11)
RDW: 19.9 % — ABNORMAL HIGH (ref 11.5–15.5)
WBC: 7.3 10*3/uL (ref 4.0–10.5)
nRBC: 0 % (ref 0.0–0.2)

## 2019-08-27 LAB — PROTIME-INR
INR: 1.1 (ref 0.8–1.2)
Prothrombin Time: 14.2 seconds (ref 11.4–15.2)

## 2019-08-27 LAB — D-DIMER, QUANTITATIVE: D-Dimer, Quant: 2.46 ug/mL-FEU — ABNORMAL HIGH (ref 0.00–0.50)

## 2019-08-27 LAB — POC SARS CORONAVIRUS 2 AG -  ED: SARS Coronavirus 2 Ag: NEGATIVE

## 2019-08-27 LAB — TROPONIN I (HIGH SENSITIVITY)
Troponin I (High Sensitivity): 104 ng/L (ref <18)
Troponin I (High Sensitivity): 110 ng/L (ref <18)

## 2019-08-27 LAB — TSH: TSH: 1.396 u[IU]/mL (ref 0.350–4.500)

## 2019-08-27 MED ORDER — SODIUM CHLORIDE 0.9 % IV SOLN
2.0000 g | Freq: Once | INTRAVENOUS | Status: AC
  Administered 2019-08-27: 2 g via INTRAVENOUS
  Filled 2019-08-27: qty 2

## 2019-08-27 MED ORDER — VANCOMYCIN HCL IN DEXTROSE 1-5 GM/200ML-% IV SOLN
1000.0000 mg | Freq: Once | INTRAVENOUS | Status: AC
  Administered 2019-08-28: 1000 mg via INTRAVENOUS
  Filled 2019-08-27: qty 200

## 2019-08-27 MED ORDER — METRONIDAZOLE IN NACL 5-0.79 MG/ML-% IV SOLN
500.0000 mg | Freq: Once | INTRAVENOUS | Status: AC
  Administered 2019-08-27: 500 mg via INTRAVENOUS
  Filled 2019-08-27: qty 100

## 2019-08-27 MED ORDER — SODIUM CHLORIDE 0.9% FLUSH
3.0000 mL | Freq: Once | INTRAVENOUS | Status: AC
  Administered 2019-08-27: 3 mL via INTRAVENOUS

## 2019-08-27 MED ORDER — LACTATED RINGERS IV BOLUS (SEPSIS)
1000.0000 mL | Freq: Once | INTRAVENOUS | Status: DC
  Administered 2019-08-27: 1000 mL via INTRAVENOUS

## 2019-08-27 MED ORDER — LACTATED RINGERS IV BOLUS (SEPSIS)
500.0000 mL | Freq: Once | INTRAVENOUS | Status: AC
  Administered 2019-08-27: 500 mL via INTRAVENOUS

## 2019-08-27 NOTE — ED Triage Notes (Signed)
Pt brought to ED by [facility] from home for c/o mid cp getting worse with deep breath, pt HR going on SVT on and off with the highest HR going to the 200 for about 30 sec. Pt given 6 mg Adenosine on the way to ED converted to SR on the 90 and continues to go in and out of SVT. 324 mg ASA given pt refuses to have any nitro sl. Last BP 130/88, HR 98-110, SPO2 100% 2L R 20.

## 2019-08-27 NOTE — ED Provider Notes (Signed)
[facility] EMERGENCY DEPARTMENT Provider Note   CSN: [id number] Arrival date & time: 08/27/19  1944     History Chief Complaint  Patient presents with  . Chest Pain    Monique Henderson is a 69 y.o. female.  HPI Patient is a 69 year old female with a PMH of hypertension, GERD, CKD (previously on HD but has recently discontinued), paroxysmal A. fib with RVR (on coumadin), CVA (05/2019), aortic dissection s/p repair (04/2018), b/l carotid artery stenosis presenting to the ED today due to chest pain.  Per EMS, patient was noted to to be in SVT with heart rate of 200 for approximately 30 seconds.  She was given 6 mg adenosine and converted to sinus rhythm with HR in the 90s.  Patient noted to fluctuate in and out of SVT afterwards by EMS.  She was also given 324 mg aspirin.  Patient reports that she has been experiencing 1 week of chest pain that worsens with deep breathing.  She says that the pain is midsternal and radiates to her throat, burning in nature, constant and 10/10 in severity.  She has experienced associated shortness of breath over the past 3 to 4 days.  She endorses nausea but denies cough, sore throat, diarrhea, vomiting, fever or chills.  Patient reports that she recently had her port for dialysis removed and she says that she was told that she no longer has to go to dialysis.    Past Medical History:  Diagnosis Date  . Atelectasis 2002   Bilateral  . Bone spur 2008   Right calcaneal foot spur  . Breast cancer ([place]) 2004   Ductal carcinoma in situ of the left breast; S/P left partial mastectomy 02/26/2003; S/P re-excision of cranial and lateral margins11/18/2004.radiation  . Breast cancer ([place]) 09/21/2012   right breast/ last radiation treatment 03/22/2013  . Chronic kidney disease, stage IV (severe) ([place]) 10/10/2007  . 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 Autumn Messing; S/P re-excision of superior  and inferior margins 10/27/2012.   Marland Kitchen GERD (gastroesophageal reflux disease)   . 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 Hot flashes   . Hx of radiation therapy 2005   left breast  . Hx of radiation therapy 01/11/13- 03/22/13   right breast 5760 cGy 30 sessions  . Hypertension   . Low back pain   . Lumbar spinal stenosis    S/P lumbar decompressive laminectomy, fusion and plating for lumbar spinal stensosis  . Normocytic anemia    With thrombocytosis  . Osteoarthritis   . Personal history of radiation therapy   . Right ureteral stone 2002  . Shortness of breath    from pain  . Uterine fibroid   . Wears dentures    top    Patient Active Problem List   Diagnosis Date Noted  . Aortic atherosclerosis ([place]) 07/05/2019  . Atypical chest pain 07/05/2019  . H/O: stroke 05/29/2019  . Paroxysmal A-fib ([place]) 05/29/2019  . ESRD on dialysis ([place]) 05/29/2019  . Cerebral thrombosis with cerebral infarction 05/22/2019  . Malnutrition of moderate degree 05/19/2019  . Pleural effusion on left 05/18/2019  . Atrial fibrillation with RVR ([place]) 05/18/2019  . S/P aortic aneurysm repair 04/07/2019  . Aortic dissection ([place]) 04/04/2019  . Dissection of aorta ([place]) 04/03/2019  . Non compliance with medical treatment 12/04/2017  . Chronic kidney disease (CKD) stage G3b/A2, moderately decreased glomerular  filtration rate (GFR) between 30-44 mL/min/1.73 square meter and albuminuria creatinine ratio between 30-299 mg/g 07/19/2017  . Chronic obstructive lung disease ([place]) 01/16/2017  . Chronic low back pain 06/22/2016  . Insomnia 03/14/2015  . S/P lumbar spinal fusion 01/18/2014  . Tobacco use disorder 04/19/2009  . Essential hypertension 09/16/2006  . GERD 09/16/2006    Past Surgical History:  Procedure Laterality Date  . ANTERIOR LAT LUMBAR FUSION N/A 01/18/2014   Procedure: ANTERIOR LATERAL LUMBAR FUSION LUMBAR TWO-THREE;  Surgeon: Eustace Moore, MD;  Location: [facility]  NEURO ORS;  Service: Neurosurgery;  Laterality: N/A;  . ANTERIOR LUMBAR FUSION  01/18/2014  . AV FISTULA PLACEMENT Left 04/20/2019   Procedure: ARTERIOVENOUS (AV) FISTULA CREATION;  Surgeon: [name], MD;  Location: [facility];  Service: Vascular;  Laterality: Left;  . BACK SURGERY    . BREAST LUMPECTOMY Left 01/2003  . BREAST LUMPECTOMY Right 2014  . BREAST LUMPECTOMY WITH NEEDLE LOCALIZATION AND AXILLARY SENTINEL LYMPH NODE BX Right 10/13/2012   Procedure: BREAST LUMPECTOMY WITH NEEDLE LOCALIZATION;  Surgeon: [name], MD;  Location: [facility];  Service: General;  Laterality: Right;  Right breast wire localized lumpectomy  . INSERTION OF DIALYSIS CATHETER Right 04/20/2019   Procedure: INSERTION OF DIALYSIS CATHETER, right internal jugular;  Surgeon: [name], MD;  Location: [facility];  Service: Vascular;  Laterality: Right;  . IR THORACENTESIS ASP PLEURAL SPACE W/IMG GUIDE  05/19/2019  . LAMINECTOMY  05/27/2009   Lumbar decompressive laminectomy, fusion and plating for lumbar spinal stensosis  . LUMBAR LAMINECTOMY/DECOMPRESSION MICRODISCECTOMY Left 03/23/2013   Procedure: LUMBAR LAMINECTOMY/DECOMPRESSION MICRODISCECTOMY 1 LEVEL;  Surgeon: Eustace Moore, MD;  Location: [place] [facility] NEURO ORS;  Service: Neurosurgery;  Laterality: Left;  LUMBAR LAMINECTOMY/DECOMPRESSION MICRODISCECTOMY 1 LEVEL  . MASTECTOMY, PARTIAL Left 02/26/2003   ; S/P re-excision of cranial and lateral margins 04/19/2003.   Marland Kitchen RE-EXCISION OF BREAST CANCER,SUPERIOR MARGINS Right 10/27/2012   Procedure: RE-EXCISION OF BREAST CANCER,SUPERIOR and inferior MARGINS;  Surgeon: [name], MD;  Location: [facility];  Service: General;  Laterality: Right;  . RE-EXCISION OF BREAST LUMPECTOMY Left 04/2003  . TEE WITHOUT CARDIOVERSION N/A 04/04/2019   Procedure: Transesophageal Echocardiogram (Tee);  Surgeon: Wonda Olds, MD;  Location: [facility];  Service: Open Heart Surgery;  Laterality: N/A;  .  THORACIC AORTIC ANEURYSM REPAIR N/A 04/04/2019   Procedure: THORACIC ASCENDING ANEURYSM REPAIR (AAA)  USING 28 MM X 30 CM HEMASHIELD PLATINUM VASCULAR GRAFT;  Surgeon: Wonda Olds, MD;  [facility];  Service: Open Heart Surgery;  Laterality: N/A;     OB History    Gravida  2   Para  2   Term  1   Preterm  1   AB      Living        SAB      TAB      Ectopic      Multiple      Live Births              Family History  Problem Relation Age of Onset  . Colon cancer Mother 34  . Hypertension Mother   . Diabetes Sister 56  . Hypertension Sister   . Diabetes Brother   . Hypertension Brother   . Diabetes Brother   . Hypertension Brother   . Kidney disease Son        On dialysis  . Hypertension Son   . Diabetes Son   .  Multiple sclerosis Son   . Bone cancer Sister 27  . Breast cancer Neg Hx   . Cervical cancer Neg Hx     Social History   Tobacco Use  . Smoking status: Former Smoker    Packs/day: 0.25    Years: 44.00    Pack years: 11.00    Types: Cigarettes    Quit date: 05/24/2019    Years since quitting: 0.2  . Smokeless tobacco: Never Used  . Tobacco comment: smoking less  Substance Use Topics  . Alcohol use: Yes    Alcohol/week: 2.0 standard drinks    Types: 2 Cans of beer per week    Comment:  "couple beers q weekend"  . Drug use: Yes    Types: Cocaine    Comment: "stopped back in the 1980's"    Home Medications Prior to Admission medications   Medication Sig Start Date End Date Taking? Authorizing Provider  albuterol (VENTOLIN HFA) 108 (90 Base) MCG/ACT inhaler Inhale 2 puffs into the lungs every 6 (six) hours as needed for wheezing or shortness of breath. 02/02/19   [name], MD  amLODipine (NORVASC) 10 MG tablet TAKE 1 TABLET (10 MG TOTAL) BY MOUTH DAILY. Patient not taking: Reported on 07/31/2019 09/20/18   [name], MD  aspirin EC 81 MG tablet Take 1 tablet (81 mg total) by mouth daily. Until INR > 2. Then stop and  continue coumadin only. 05/23/19   Dessa Phi, DO  cloNIDine (CATAPRES - DOSED IN MG/24 HR) 0.3 mg/24hr patch Place 1 patch (0.3 mg total) onto the skin once a week. Patient taking differently: Place 0.3 mg onto the skin every Saturday.  04/22/19   Nani Skillern, PA-C  hydrALAZINE (APRESOLINE) 25 MG tablet Take 25 mg by mouth 3 (three) times daily.    [provider]  losartan (COZAAR) 50 MG tablet Take 50 mg by mouth at bedtime. 07/04/19   [provider]  metoprolol tartrate (LOPRESSOR) 50 MG tablet Take 1 tablet (50 mg total) by mouth 2 (two) times daily. 08/03/19   [name], MD  mirtazapine (REMERON) 7.5 MG tablet TAKE 1 TABLET (7.5 MG TOTAL) BY MOUTH AT BEDTIME. 08/04/19   [name], MD  ondansetron (ZOFRAN) 4 MG tablet Take 1 tablet (4 mg total) by mouth every 8 (eight) hours as needed for nausea or vomiting. 08/03/19   [name], MD  oxyCODONE-acetaminophen (PERCOCET/ROXICET) 5-325 MG tablet Take 1 tablet by mouth 3 (three) times daily as needed for severe pain (pain.).    [provider]  rosuvastatin (CRESTOR) 10 MG tablet Take 1 tablet (10 mg total) by mouth daily. 06/14/19 09/12/19  Patwardhan, Reynold Bowen, MD  warfarin (COUMADIN) 2.5 MG tablet Alternate 1 tab (2.5mg ) with 2 tabs (5 mg) every other day. 07/11/19   [name], MD    Allergies    Shrimp [shellfish allergy], Bactroban [mupirocin], Vicodin [hydrocodone-acetaminophen], Lisinopril, and Tylenol [acetaminophen]  Review of Systems   Review of Systems  Constitutional: Negative for appetite change, chills and fever.  HENT: Negative for rhinorrhea and sore throat.   Eyes: Negative for pain and visual disturbance.  Respiratory: Positive for shortness of breath. Negative for cough.   Cardiovascular: Positive for chest pain. Negative for palpitations and leg swelling.  Gastrointestinal: Positive for nausea. Negative for abdominal pain, diarrhea and vomiting.  Genitourinary:  Negative for dysuria and hematuria.  Musculoskeletal: Negative for arthralgias and back pain.  Skin: Negative for rash and wound.  Neurological: Negative for light-headedness and headaches.  Psychiatric/Behavioral: Negative for agitation.  All other systems reviewed and are negative.   Physical Exam Updated Vital Signs BP (!) 144/83 (BP Location: Right Arm)   Pulse 91   Temp (!) 100.6 F (38.1 C) (Oral)   Resp (!) 33   Ht 5\' 3"  (1.6 m)   Wt 45.4 kg   SpO2 100%   BMI 17.71 kg/m   Physical Exam Vitals and nursing note reviewed.  Constitutional:      General: She is not in acute distress.    Appearance: Normal appearance. She is well-developed. She is not ill-appearing.  HENT:     Head: Normocephalic and atraumatic.     Right Ear: External ear normal.     Left Ear: External ear normal.     Nose: Nose normal. No congestion or rhinorrhea.     Mouth/Throat:     Mouth: Mucous membranes are moist.     Pharynx: Oropharynx is clear.  Eyes:     Extraocular Movements: Extraocular movements intact.     Pupils: Pupils are equal, round, and reactive to light.  Cardiovascular:     Rate and Rhythm: Normal rate and regular rhythm.     Pulses: Normal pulses.     Heart sounds: Normal heart sounds.  Pulmonary:     Effort: Pulmonary effort is normal. No respiratory distress.     Breath sounds: Normal breath sounds. No stridor. No wheezing, rhonchi or rales.  Abdominal:     General: There is no distension.     Palpations: Abdomen is soft.     Tenderness: There is no abdominal tenderness.  Musculoskeletal:        General: Normal range of motion.     Cervical back: Normal range of motion and neck supple.     Right lower leg: No edema.     Left lower leg: No edema.  Skin:    General: Skin is warm and dry.     Capillary Refill: Capillary refill takes less than 2 seconds.  Neurological:     General: No focal deficit present.     Mental Status: She is alert and oriented to person, place,  and time. Mental status is at baseline.  Psychiatric:        Mood and Affect: Mood normal.     ED Results / Procedures / Treatments   Labs (all labs ordered are listed, but only abnormal results are displayed) Labs Reviewed  BASIC METABOLIC PANEL  CBC  TROPONIN I (HIGH SENSITIVITY)    EKG EKG Interpretation  Date/Time:  Sunday August 27 2019 19:51:11 EDT Ventricular Rate:  100 PR Interval:    QRS Duration: 72 QT Interval:  344 QTC Calculation: 417 R Axis:   10 Text Interpretation: Sinus tachycardia Ventricular bigeminy LAE, consider biatrial enlargement Anteroseptal infarct, old Abnormal T, consider ischemia, lateral leads No acute changes besides bigeminy Nonspecific ST and T wave abnormality Confirmed by Varney Biles [id number]) on 08/27/2019 9:40:29 PM   Radiology DG Chest 2 View  Result Date: 08/27/2019 CLINICAL DATA:  Chest pain with deep inspiration EXAM: CHEST - 2 VIEW COMPARISON:  06/08/2019 FINDINGS: Cardiac shadow is mildly enlarged. Postsurgical changes are again seen and stable. Previously seen dialysis catheter has been removed in the interval. The lungs are well aerated bilaterally with the exception of the left lung base which demonstrates some chronic blunting of the costophrenic angle and atelectatic changes similar to that seen on prior exam. No bony abnormality is  noted. IMPRESSION: Small left effusion and atelectatic changes similar to that seen on prior exam. Electronically Signed   By: Inez Catalina M.D.   On: 08/27/2019 20:08    Procedures Procedures (including critical care time)  Medications Ordered in ED Medications  vancomycin (VANCOCIN) IVPB 1000 mg/200 mL premix (1,000 mg Intravenous New Bag/Given 08/28/19 0134)  metoprolol tartrate (LOPRESSOR) tablet 50 mg (50 mg Oral Given 08/28/19 0142)  0.9 %  sodium chloride infusion ( Intravenous New Bag/Given 08/28/19 0132)  acetaminophen (TYLENOL) tablet 650 mg (has no administration in time range)    Or    acetaminophen (TYLENOL) suppository 650 mg (has no administration in time range)  ondansetron (ZOFRAN) tablet 4 mg (has no administration in time range)    Or  ondansetron (ZOFRAN) injection 4 mg (has no administration in time range)  fentaNYL (SUBLIMAZE) injection 25-50 mcg (has no administration in time range)  warfarin (COUMADIN) tablet 5 mg (has no administration in time range)  heparin ADULT infusion 100 units/mL (25000 units/266mL sodium chloride 0.45%) (650 Units/hr Intravenous New Bag/Given 08/28/19 0141)  Warfarin - Pharmacist Dosing Inpatient (has no administration in time range)  sodium chloride flush (NS) 0.9 % injection 3 mL (3 mLs Intravenous Given 08/27/19 2049)  lactated ringers bolus 500 mL (0 mLs Intravenous Stopped 08/28/19 0051)  ceFEPIme (MAXIPIME) 2 g in sodium chloride 0.9 % 100 mL IVPB (0 g Intravenous Stopped 08/28/19 0029)  metroNIDAZOLE (FLAGYL) IVPB 500 mg (0 mg Intravenous Stopped 08/28/19 0051)  heparin bolus via infusion 2,500 Units (2,500 Units Intravenous Bolus from Bag 08/28/19 0139)    ED Course  I have reviewed the triage vital signs and the nursing notes.  Pertinent labs & imaging results that were available during my care of the patient were reviewed by me and considered in my medical decision making (see chart for details).    MDM Rules/Calculators/A&P                     Patient is a 69 year old female with a PMH of hypertension, GERD, CKD (previously on HD but has recently discontinued), paroxysmal A. fib with RVR (on coumadin), CVA (05/2019), aortic dissection s/p repair (04/2018), b/l carotid artery stenosis presenting to the ED today due to chest pain for 1 week.  Physical exam unremarkable.  BP 144/83, HR 91, RR 33, SPO2 100% on room air.  Temp 100.61F.  On arrival, patient appears generally well and is displaying no signs of acute distress.  She is febrile although she was unaware of this.  She is also tachypneic; however, she has no signs of hypoxia  and she speaks in full sentences without difficulty or increased work of breathing.  We will collect EKG, chest x-ray, CBC, CMP and serial troponins for evaluation of chest pain.  Patient received 6 mg adenosine due to SVT with EMS.  She had one brief spell of what appears to be SVT, captured on EKG; however, she has a HR below 100 for the majority of the time.  EKG with sinus tachycardia, T wave inversions in aVL, V5 and V6.  Inversions in V5 and V6 appear new from previous.  Chest x-ray shows a small left effusion but no obvious pneumonia or pneumothorax.  CBC with hemoglobin 10.9 which appears consistent with previous.  BMP with potassium 5.4 (hemolysis), BUN 37, creatinine 3.16.  Serial troponins 104 and 110, consecutively.  TSH within normal limits.  POC Covid negative.  Patient technically meets SIRS criteria with unknown source  of infection.  She says that she makes plenty of urine and has been off of dialysis for the past 2 weeks.  Lactic acid and urinalysis pending.  Blood and urine cultures collected.  Initiated IV fluids and will provide broad-spectrum antibiotics.  PE was considered given patient's significant pleuritic chest pain and low-grade fever with unapparent source.  However, she does not appear to have many risk factors and is anticoagulated on warfarin.  D-dimer 2.46.  Second Covid swab still pending.  Given patient's elevated creatinine, will defer chest imaging to inpatient team.  Pericarditis and myocarditis still a possibility at this time given troponinemia, chest pain and fever.  Patient admitted to hospitalist service at this time.  For details on hospital course following admission, please refer to inpatient team's note.  Patient stable at time of admission.  Patient assessed and evaluated with Dr. Kathrynn Humble.  Nadeen Landau, MD     Final Clinical Impression(s) / ED Diagnoses Final diagnoses:  Chest pain, unspecified type    Rx / DC Orders ED Discharge Orders    None         Nadeen Landau, MD 08/28/19 2081    Varney Biles, MD 08/29/19 [phone]

## 2019-08-28 ENCOUNTER — Encounter (HOSPITAL_COMMUNITY): Payer: Self-pay | Admitting: Internal Medicine

## 2019-08-28 ENCOUNTER — Ambulatory Visit: Payer: Medicare Other | Admitting: Family Medicine

## 2019-08-28 ENCOUNTER — Observation Stay (HOSPITAL_COMMUNITY): Payer: Medicare Other

## 2019-08-28 DIAGNOSIS — I471 Supraventricular tachycardia, unspecified: Secondary | ICD-10-CM | POA: Diagnosis present

## 2019-08-28 DIAGNOSIS — R0789 Other chest pain: Secondary | ICD-10-CM | POA: Diagnosis not present

## 2019-08-28 DIAGNOSIS — R7989 Other specified abnormal findings of blood chemistry: Secondary | ICD-10-CM

## 2019-08-28 DIAGNOSIS — Z20822 Contact with and (suspected) exposure to covid-19: Secondary | ICD-10-CM | POA: Diagnosis not present

## 2019-08-28 DIAGNOSIS — E875 Hyperkalemia: Secondary | ICD-10-CM | POA: Diagnosis present

## 2019-08-28 DIAGNOSIS — I480 Paroxysmal atrial fibrillation: Secondary | ICD-10-CM | POA: Diagnosis not present

## 2019-08-28 DIAGNOSIS — R778 Other specified abnormalities of plasma proteins: Secondary | ICD-10-CM | POA: Diagnosis present

## 2019-08-28 DIAGNOSIS — J90 Pleural effusion, not elsewhere classified: Secondary | ICD-10-CM | POA: Diagnosis not present

## 2019-08-28 HISTORY — DX: Supraventricular tachycardia, unspecified: I47.10

## 2019-08-28 HISTORY — DX: Hyperkalemia: E87.5

## 2019-08-28 HISTORY — DX: Other specified abnormal findings of blood chemistry: R79.89

## 2019-08-28 HISTORY — DX: Supraventricular tachycardia: I47.1

## 2019-08-28 HISTORY — DX: Other specified abnormalities of plasma proteins: R77.8

## 2019-08-28 LAB — BASIC METABOLIC PANEL
Anion gap: 10 (ref 5–15)
BUN: 33 mg/dL — ABNORMAL HIGH (ref 8–23)
CO2: 27 mmol/L (ref 22–32)
Calcium: 9 mg/dL (ref 8.9–10.3)
Chloride: 103 mmol/L (ref 98–111)
Creatinine, Ser: 2.92 mg/dL — ABNORMAL HIGH (ref 0.44–1.00)
GFR calc Af Amer: 19 mL/min — ABNORMAL LOW (ref >60)
GFR calc non Af Amer: 16 mL/min — ABNORMAL LOW (ref >60)
Glucose, Bld: 134 mg/dL — ABNORMAL HIGH (ref 70–99)
Potassium: 3.7 mmol/L (ref 3.5–5.1)
Sodium: 140 mmol/L (ref 135–145)

## 2019-08-28 LAB — CBC WITH DIFFERENTIAL/PLATELET
Abs Immature Granulocytes: 0.02 10*3/uL (ref 0.00–0.07)
Basophils Absolute: 0.1 10*3/uL (ref 0.0–0.1)
Basophils Relative: 1 %
Eosinophils Absolute: 0.2 10*3/uL (ref 0.0–0.5)
Eosinophils Relative: 3 %
HCT: 37.4 % (ref 36.0–46.0)
Hemoglobin: 11 g/dL — ABNORMAL LOW (ref 12.0–15.0)
Immature Granulocytes: 0 %
Lymphocytes Relative: 22 %
Lymphs Abs: 1.6 10*3/uL (ref 0.7–4.0)
MCH: 24 pg — ABNORMAL LOW (ref 26.0–34.0)
MCHC: 29.4 g/dL — ABNORMAL LOW (ref 30.0–36.0)
MCV: 81.5 fL (ref 80.0–100.0)
Monocytes Absolute: 0.6 10*3/uL (ref 0.1–1.0)
Monocytes Relative: 9 %
Neutro Abs: 4.7 10*3/uL (ref 1.7–7.7)
Neutrophils Relative %: 65 %
Platelets: 307 10*3/uL (ref 150–400)
RBC: 4.59 MIL/uL (ref 3.87–5.11)
RDW: 19.8 % — ABNORMAL HIGH (ref 11.5–15.5)
WBC: 7.2 10*3/uL (ref 4.0–10.5)
nRBC: 0 % (ref 0.0–0.2)

## 2019-08-28 LAB — URINALYSIS, ROUTINE W REFLEX MICROSCOPIC
Bilirubin Urine: NEGATIVE
Glucose, UA: NEGATIVE mg/dL
Hgb urine dipstick: NEGATIVE
Ketones, ur: NEGATIVE mg/dL
Nitrite: NEGATIVE
Protein, ur: >=300 mg/dL — AB
Specific Gravity, Urine: 1.012 (ref 1.005–1.030)
pH: 6 (ref 5.0–8.0)

## 2019-08-28 LAB — ECHOCARDIOGRAM LIMITED
Height: 63 in
Weight: 1600 oz

## 2019-08-28 LAB — SARS CORONAVIRUS 2 (TAT 6-24 HRS): SARS Coronavirus 2: NEGATIVE

## 2019-08-28 LAB — HEPARIN LEVEL (UNFRACTIONATED)
Heparin Unfractionated: <0.1 IU/mL — ABNORMAL LOW (ref 0.30–0.70)
Heparin Unfractionated: <0.1 IU/mL — ABNORMAL LOW (ref 0.30–0.70)

## 2019-08-28 LAB — TROPONIN I (HIGH SENSITIVITY): Troponin I (High Sensitivity): 79 ng/L — ABNORMAL HIGH (ref <18)

## 2019-08-28 LAB — LACTIC ACID, PLASMA
Lactic Acid, Venous: 1.1 mmol/L (ref 0.5–1.9)
Lactic Acid, Venous: 1.1 mmol/L (ref 0.5–1.9)
Lactic Acid, Venous: 1.3 mmol/L (ref 0.5–1.9)
Lactic Acid, Venous: 4.6 mmol/L (ref 0.5–1.9)

## 2019-08-28 LAB — PROCALCITONIN: Procalcitonin: 1.82 ng/mL

## 2019-08-28 MED ORDER — ROSUVASTATIN CALCIUM 5 MG PO TABS
10.0000 mg | ORAL_TABLET | Freq: Every day | ORAL | Status: DC
  Administered 2019-08-28 – 2019-08-29 (×2): 10 mg via ORAL
  Filled 2019-08-28 (×3): qty 2

## 2019-08-28 MED ORDER — SODIUM CHLORIDE 0.9% FLUSH
3.0000 mL | Freq: Two times a day (BID) | INTRAVENOUS | Status: DC
  Administered 2019-08-28 – 2019-08-29 (×3): 3 mL via INTRAVENOUS

## 2019-08-28 MED ORDER — WARFARIN SODIUM 5 MG PO TABS
5.0000 mg | ORAL_TABLET | Freq: Once | ORAL | Status: AC
  Administered 2019-08-28: 5 mg via ORAL
  Filled 2019-08-28 (×2): qty 1

## 2019-08-28 MED ORDER — ONDANSETRON HCL 4 MG/2ML IJ SOLN: 4.0000 mg | Freq: Four times a day (QID) | INTRAMUSCULAR | Status: DC | PRN

## 2019-08-28 MED ORDER — METOPROLOL TARTRATE 50 MG PO TABS
50.0000 mg | ORAL_TABLET | Freq: Two times a day (BID) | ORAL | Status: DC
  Administered 2019-08-28 – 2019-08-30 (×6): 50 mg via ORAL
  Filled 2019-08-28: qty 1
  Filled 2019-08-28: qty 2
  Filled 2019-08-28 (×2): qty 1
  Filled 2019-08-28: qty 2
  Filled 2019-08-28: qty 1

## 2019-08-28 MED ORDER — HEPARIN BOLUS VIA INFUSION
1500.0000 [IU] | Freq: Once | INTRAVENOUS | Status: AC
  Administered 2019-08-28: 1500 [IU] via INTRAVENOUS
  Filled 2019-08-28: qty 1500

## 2019-08-28 MED ORDER — ASPIRIN EC 81 MG PO TBEC
81.0000 mg | DELAYED_RELEASE_TABLET | Freq: Every day | ORAL | Status: DC
  Administered 2019-08-28 – 2019-08-30 (×3): 81 mg via ORAL
  Filled 2019-08-28 (×3): qty 1

## 2019-08-28 MED ORDER — HEPARIN BOLUS VIA INFUSION
2500.0000 [IU] | Freq: Once | INTRAVENOUS | Status: AC
  Administered 2019-08-28: 2500 [IU] via INTRAVENOUS
  Filled 2019-08-28: qty 2500

## 2019-08-28 MED ORDER — SODIUM CHLORIDE 0.9 % IV SOLN
2.0000 g | INTRAVENOUS | Status: DC
  Administered 2019-08-28 – 2019-08-30 (×3): 2 g via INTRAVENOUS
  Filled 2019-08-28: qty 2
  Filled 2019-08-28: qty 20
  Filled 2019-08-28: qty 2
  Filled 2019-08-28: qty 20

## 2019-08-28 MED ORDER — ACETAMINOPHEN 650 MG RE SUPP: 650.0000 mg | Freq: Four times a day (QID) | RECTAL | Status: DC | PRN

## 2019-08-28 MED ORDER — HEPARIN (PORCINE) 25000 UT/250ML-% IV SOLN
1350.0000 [IU]/h | INTRAVENOUS | Status: DC
  Administered 2019-08-28: 650 [IU]/h via INTRAVENOUS
  Administered 2019-08-29: 1000 [IU]/h via INTRAVENOUS
  Administered 2019-08-30: 1350 [IU]/h via INTRAVENOUS
  Filled 2019-08-28 (×3): qty 250

## 2019-08-28 MED ORDER — HEPARIN BOLUS VIA INFUSION
1350.0000 [IU] | Freq: Once | INTRAVENOUS | Status: AC
  Administered 2019-08-28: 1350 [IU] via INTRAVENOUS
  Filled 2019-08-28: qty 1350

## 2019-08-28 MED ORDER — LACTATED RINGERS IV BOLUS
1000.0000 mL | Freq: Once | INTRAVENOUS | Status: AC
  Administered 2019-08-28: 1000 mL via INTRAVENOUS

## 2019-08-28 MED ORDER — ONDANSETRON HCL 4 MG PO TABS
4.0000 mg | ORAL_TABLET | Freq: Four times a day (QID) | ORAL | Status: DC | PRN
  Administered 2019-08-30: 4 mg via ORAL
  Filled 2019-08-28: qty 1

## 2019-08-28 MED ORDER — WARFARIN SODIUM 5 MG PO TABS
5.0000 mg | ORAL_TABLET | ORAL | Status: AC
  Administered 2019-08-28: 5 mg via ORAL
  Filled 2019-08-28: qty 1

## 2019-08-28 MED ORDER — ACETAMINOPHEN 325 MG PO TABS: 650.0000 mg | ORAL_TABLET | Freq: Four times a day (QID) | ORAL | Status: DC | PRN

## 2019-08-28 MED ORDER — FENTANYL CITRATE (PF) 100 MCG/2ML IJ SOLN
25.0000 ug | INTRAMUSCULAR | Status: DC | PRN
  Administered 2019-08-28: 50 ug via INTRAVENOUS
  Administered 2019-08-28: 25 ug via INTRAVENOUS
  Administered 2019-08-28 – 2019-08-29 (×2): 50 ug via INTRAVENOUS
  Administered 2019-08-29 (×2): 25 ug via INTRAVENOUS
  Filled 2019-08-28 (×6): qty 2

## 2019-08-28 MED ORDER — WARFARIN - PHARMACIST DOSING INPATIENT: Freq: Every day | Status: DC

## 2019-08-28 MED ORDER — SODIUM CHLORIDE 0.9 % IV SOLN
500.0000 mg | INTRAVENOUS | Status: DC
  Administered 2019-08-28 – 2019-08-30 (×3): 500 mg via INTRAVENOUS
  Filled 2019-08-28 (×4): qty 500

## 2019-08-28 MED ORDER — SODIUM CHLORIDE 0.9 % IV SOLN: INTRAVENOUS | Status: DC

## 2019-08-28 MED ORDER — PERFLUTREN LIPID MICROSPHERE
1.0000 mL | INTRAVENOUS | Status: AC | PRN
  Administered 2019-08-28: 2 mL via INTRAVENOUS
  Filled 2019-08-28: qty 10

## 2019-08-28 NOTE — H&P (Signed)
History and Physical    Monique Henderson BHA:[id number] DOB: 12/02/1950 DOA: 08/27/2019  PCP: [name], MD   Patient coming from: Home   Chief Complaint: Chest pain, cough   HPI: Monique Henderson is a 69 y.o. female with medical history significant for chronic kidney disease previously on dialysis until she reports being taken off of that 2 weeks ago, paroxysmal atrial fibrillation on warfarin, history of CVA, hypertension, now presenting to the ED with 1 week of chest pain around cough.  Patient reports that she has had constant sharp pain in the central chest for the past week, worse with deep breath or cough, has not been particularly short of breath, but has had increasing nonproductive cough.  She denies nausea or diaphoresis.  She reports a few days of chills.  Reports continued adherence with her warfarin and states that she was last dialyzed 2 weeks ago, at which point she was told that she no longer needs it.  She denies any lower extremity swelling or tenderness, denies orthopnea.  She denies any dysuria, flank pain, or abdominal pain.  EMS was called, found the patient to be in SVT with rate of 200, and she was treated with adenosine and 324 mg aspirin prior to arrival in the ED.  ED Course: Upon arrival to the ED, patient is found to be febrile to 38.1 C, saturating 100% on room air, heart rate in the 90s, blood pressure 150/78.  EKG features sinus tachycardia with rate 100 and PVCs.  Chest x-ray notable for small left effusion and atelectasis similar to prior.  Chemistry panel with potassium 5.4, BUN 37, and creatinine 3.16.  High-sensitivity troponin is elevated to 104.  INR is 1.1.  TSH is normal.  Covid antigen is negative, and Covid PCR has been ordered.  Blood cultures were collected in the ED, 500 cc of lactated Ringer's was administered, and the patient was treated with vancomycin, cefepime, and Flagyl.  Review of Systems:  All other systems reviewed and apart from HPI,  are negative.  Past Medical History:  Diagnosis Date  . Atelectasis 2002   Bilateral  . Bone spur 2008   Right calcaneal foot spur  . Breast cancer ([place]) 2004   Ductal carcinoma in situ of the left breast; S/P left partial mastectomy 02/26/2003; S/P re-excision of cranial and lateral margins11/18/2004.radiation  . Breast cancer ([place]) 09/21/2012   right breast/ last radiation treatment 03/22/2013  . Chronic kidney disease, stage IV (severe) ([place]) 10/10/2007  . 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 Autumn Messing; S/P re-excision of superior and inferior margins 10/27/2012.   Marland Kitchen GERD (gastroesophageal reflux disease)   . 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 Hot flashes   . Hx of radiation therapy 2005   left breast  . Hx of radiation therapy 01/11/13- 03/22/13   right breast 5760 cGy 30 sessions  . Hypertension   . Low back pain   . Lumbar spinal stenosis    S/P lumbar decompressive laminectomy, fusion and plating for lumbar spinal stensosis  . Normocytic anemia    With thrombocytosis  . Osteoarthritis   . Personal history of radiation therapy   . Right ureteral stone 2002  . Shortness of breath    from pain  . Uterine fibroid   . Wears dentures    top    Past Surgical History:  Procedure Laterality Date  .  ANTERIOR LAT LUMBAR FUSION N/A 01/18/2014   Procedure: ANTERIOR LATERAL LUMBAR FUSION LUMBAR TWO-THREE;  Surgeon: Eustace Moore, MD;  Location: [place];  Service: Neurosurgery;  Laterality: N/A;  . ANTERIOR LUMBAR FUSION  01/18/2014  . AV FISTULA PLACEMENT Left 04/20/2019   Procedure: ARTERIOVENOUS (AV) FISTULA CREATION;  Surgeon: [name], MD;  Location: [facility];  Service: Vascular;  Laterality: Left;  . BACK SURGERY    . BREAST LUMPECTOMY Left 01/2003  . BREAST LUMPECTOMY Right 2014  . BREAST LUMPECTOMY WITH NEEDLE LOCALIZATION AND AXILLARY SENTINEL LYMPH NODE BX Right  10/13/2012   Procedure: BREAST LUMPECTOMY WITH NEEDLE LOCALIZATION;  Surgeon: [name], MD;  Location: [place];  Service: General;  Laterality: Right;  Right breast wire localized lumpectomy  . INSERTION OF DIALYSIS CATHETER Right 04/20/2019   Procedure: INSERTION OF DIALYSIS CATHETER, right internal jugular;  Surgeon: [name], MD;  Location: [place];  Service: Vascular;  Laterality: Right;  . IR THORACENTESIS ASP PLEURAL SPACE W/IMG GUIDE  05/19/2019  . LAMINECTOMY  05/27/2009   Lumbar decompressive laminectomy, fusion and plating for lumbar spinal stensosis  . LUMBAR LAMINECTOMY/DECOMPRESSION MICRODISCECTOMY Left 03/23/2013   Procedure: LUMBAR LAMINECTOMY/DECOMPRESSION MICRODISCECTOMY 1 LEVEL;  Surgeon: Eustace Moore, MD;  Location: [place] [facility];  Service: Neurosurgery;  Laterality: Left;  LUMBAR LAMINECTOMY/DECOMPRESSION MICRODISCECTOMY 1 LEVEL  . MASTECTOMY, PARTIAL Left 02/26/2003   ; S/P re-excision of cranial and lateral margins 04/19/2003.   Marland Kitchen RE-EXCISION OF BREAST CANCER,SUPERIOR MARGINS Right 10/27/2012   Procedure: RE-EXCISION OF BREAST CANCER,SUPERIOR and inferior MARGINS;  Surgeon: [name], MD;  Location: [facility];  Service: General;  Laterality: Right;  . RE-EXCISION OF BREAST LUMPECTOMY Left 04/2003  . TEE WITHOUT CARDIOVERSION N/A 04/04/2019   Procedure: Transesophageal Echocardiogram (Tee);  Surgeon: Wonda Olds, MD;  Location: [place];  Service: Open Heart Surgery;  Laterality: N/A;  . THORACIC AORTIC ANEURYSM REPAIR N/A 04/04/2019   Procedure: THORACIC ASCENDING ANEURYSM REPAIR (AAA)  USING 28 MM X 30 CM HEMASHIELD PLATINUM VASCULAR GRAFT;  Surgeon: Wonda Olds, MD;  Location[facility];  Service: Open Heart Surgery;  Laterality: N/A;     reports that she quit smoking about 3 months ago. Her smoking use included cigarettes. She has a 11.00 pack-year smoking history. She has never used smokeless tobacco. She reports current alcohol  use of about 2.0 standard drinks of alcohol per week. She reports current drug use. Drug: Cocaine.  Allergies  Allergen Reactions  . Shrimp [Shellfish Allergy] Shortness Of Breath  . Bactroban [Mupirocin] Other (See Comments)    "Sores in nose"  . Vicodin [Hydrocodone-Acetaminophen] Itching and Nausea And Vomiting    This is patient's home medication  . Lisinopril Cough  . Tylenol [Acetaminophen] Itching    Family History  Problem Relation Age of Onset  . Colon cancer Mother 31  . Hypertension Mother   . Diabetes Sister 47  . Hypertension Sister   . Diabetes Brother   . Hypertension Brother   . Diabetes Brother   . Hypertension Brother   . Kidney disease Son        On dialysis  . Hypertension Son   . Diabetes Son   . Multiple sclerosis Son   . Bone cancer Sister 74  . Breast cancer Neg Hx   . Cervical cancer Neg Hx      Prior to Admission medications   Medication Sig Start Date End Date Taking? Authorizing Provider  albuterol (VENTOLIN HFA) 108 (90 Base) MCG/ACT inhaler Inhale 2 puffs into the lungs every 6 (six) hours as needed for wheezing or shortness of breath. 02/02/19   [name], MD  amLODipine (NORVASC) 10 MG tablet TAKE 1 TABLET (10 MG TOTAL) BY MOUTH DAILY. Patient not taking: Reported on 07/31/2019 09/20/18   [name], MD  aspirin EC 81 MG tablet Take 1 tablet (81 mg total) by mouth daily. Until INR > 2. Then stop and continue coumadin only. 05/23/19   Dessa Phi, DO  cloNIDine (CATAPRES - DOSED IN MG/24 HR) 0.3 mg/24hr patch Place 1 patch (0.3 mg total) onto the skin once a week. Patient taking differently: Place 0.3 mg onto the skin every Saturday.  04/22/19   Nani Skillern, PA-C  hydrALAZINE (APRESOLINE) 25 MG tablet Take 25 mg by mouth 3 (three) times daily.    [provider]  losartan (COZAAR) 50 MG tablet Take 50 mg by mouth at bedtime. 07/04/19   [provider]  metoprolol tartrate (LOPRESSOR) 50 MG tablet Take 1  tablet (50 mg total) by mouth 2 (two) times daily. 08/03/19   [name], MD  mirtazapine (REMERON) 7.5 MG tablet TAKE 1 TABLET (7.5 MG TOTAL) BY MOUTH AT BEDTIME. 08/04/19   [name], MD  ondansetron (ZOFRAN) 4 MG tablet Take 1 tablet (4 mg total) by mouth every 8 (eight) hours as needed for nausea or vomiting. 08/03/19   [name], MD  oxyCODONE-acetaminophen (PERCOCET/ROXICET) 5-325 MG tablet Take 1 tablet by mouth 3 (three) times daily as needed for severe pain (pain.).    [provider]  rosuvastatin (CRESTOR) 10 MG tablet Take 1 tablet (10 mg total) by mouth daily. 06/14/19 09/12/19  Patwardhan, Reynold Bowen, MD  warfarin (COUMADIN) 2.5 MG tablet Alternate 1 tab (2.5mg ) with 2 tabs (5 mg) every other day. 07/11/19   [name], MD    Physical Exam: Vitals:   08/27/19 2245 08/27/19 2300 08/27/19 2315 08/27/19 2330  BP: (!) 150/78 (!) 155/88 (!) 155/87 (!) 154/91  Pulse: (!) 32 77 74 67  Resp: 20 14 (!) 23 20  Temp:      TempSrc:      SpO2: 100% 100% 100% 100%  Weight:      Height:        Constitutional: NAD, calm  Eyes: PERTLA, lids and conjunctivae normal ENMT: Mucous membranes are moist. Posterior pharynx clear of any exudate or lesions.   Neck: normal, supple, no masses, no thyromegaly Respiratory: no wheezing, no crackles. No accessory muscle use.  Cardiovascular: S1 & S2 heard, regular rate and rhythm. No extremity edema.   Abdomen: No distension, no tenderness, soft. Bowel sounds active.  Musculoskeletal: no clubbing / cyanosis. No joint deformity upper and lower extremities.   Skin: no significant rashes, lesions, ulcers. Poor turgor. Neurologic: CN 2-12 grossly intact. Sensation intact. Moving all extremities.  Psychiatric: Alert and oriented x 3. Pleasant and cooperative.    Labs and Imaging on Admission: I have personally reviewed following labs and imaging studies  CBC: Recent Labs  Lab 08/27/19 2050  WBC 7.3  NEUTROABS 5.5  HGB 10.9*    HCT 36.7  MCV 81.0  PLT 628   Basic Metabolic Panel: Recent Labs  Lab 08/27/19 2050  NA 138  K 5.4*  CL 102  CO2 25  GLUCOSE 126*  BUN 37*  CREATININE 3.16*  CALCIUM 8.9   GFR: Estimated Creatinine Clearance: 12.6 mL/min (A) (by  C-G formula based on SCr of 3.16 mg/dL (H)). Liver Function Tests: No results for input(s): AST, ALT, ALKPHOS, BILITOT, PROT, ALBUMIN in the last 168 hours. No results for input(s): LIPASE, AMYLASE in the last 168 hours. No results for input(s): AMMONIA in the last 168 hours. Coagulation Profile: Recent Labs  Lab 08/27/19 2119  INR 1.1   Cardiac Enzymes: No results for input(s): CKTOTAL, CKMB, CKMBINDEX, TROPONINI in the last 168 hours. BNP (last 3 results) No results for input(s): PROBNP in the last 8760 hours. HbA1C: No results for input(s): HGBA1C in the last 72 hours. CBG: No results for input(s): GLUCAP in the last 168 hours. Lipid Profile: No results for input(s): CHOL, HDL, LDLCALC, TRIG, CHOLHDL, LDLDIRECT in the last 72 hours. Thyroid Function Tests: Recent Labs    08/27/19 2211  TSH 1.396   Anemia Panel: No results for input(s): VITAMINB12, FOLATE, FERRITIN, TIBC, IRON, RETICCTPCT in the last 72 hours. Urine analysis:    Component Value Date/Time   COLORURINE AMBER (A) 04/16/2019 0921   APPEARANCEUR CLOUDY (A) 04/16/2019 0921   LABSPEC 1.020 04/16/2019 0921   PHURINE 6.0 04/16/2019 0921   GLUCOSEU NEGATIVE 04/16/2019 0921   GLUCOSEU NEG mg/dL 10/30/2009 1944   HGBUR NEGATIVE 04/16/2019 [place] 04/16/2019 0921   BILIRUBINUR negative 05/04/2018 0910   KETONESUR NEGATIVE 04/16/2019 0921   PROTEINUR 100 (A) 04/16/2019 0921   UROBILINOGEN 0.2 05/04/2018 0910   UROBILINOGEN 1 02/14/2014 0946   NITRITE NEGATIVE 04/16/2019 0921   LEUKOCYTESUR TRACE (A) 04/16/2019 0921   Sepsis Labs: @LABRCNTIP (procalcitonin:4,lacticidven:4) )No results found for this or any previous visit (from the past 240 hour(s)).    Radiological Exams on Admission: DG Chest 2 View  Result Date: 08/27/2019 CLINICAL DATA:  Chest pain with deep inspiration EXAM: CHEST - 2 VIEW COMPARISON:  06/08/2019 FINDINGS: Cardiac shadow is mildly enlarged. Postsurgical changes are again seen and stable. Previously seen dialysis catheter has been removed in the interval. The lungs are well aerated bilaterally with the exception of the left lung base which demonstrates some chronic blunting of the costophrenic angle and atelectatic changes similar to that seen on prior exam. No bony abnormality is noted. IMPRESSION: Small left effusion and atelectatic changes similar to that seen on prior exam. Electronically Signed   By: Inez Catalina M.D.   On: 08/27/2019 20:08    EKG: Independently reviewed. Sinus tachycardia, rate 100, PVCs.   Assessment/Plan   1. SIRS  - Presents with 1 wk of chest pain, reports recent non-productive cough, treated with adenosine by EMS pta, and is found to have fever without leukocytosis, CXR without appreciable PNA, benign abd exam, denies urinary sxs - Blood cultures collected in ED and broad-spectrum antibiotics administered  - Check lactic acid and procalcitonin, continue isolation pending COVID pcr results, check UA, follow cultures and clinical course, hold further antibiotics for now    2. Paroxysmal SVT; paroxysmal atrial fibrillation  - Presents with 1 wk of constant central chest pain, was in SVT with EMS and treated with adenosine pta  - In sinus rhythm with rate 90s at time of admission  - She has hx of PAF with CHADS-VASc at least 5 (age, gender, CVA x2, HTN) - She reports adherence with warfarin but INR only 1.1  - Start IV heparin and continue warfarin, continue continue cardiac monitoring, continue metoprolol   3. CKD IV  - SCr is 3.16 on admission and potassium 5.4; patient reports being taken off HD 2 weeks ago  -  Renally-dose medications, repeat chem panel in am    4. History of CVA  -  Continue ASA and statin    5. Elevated troponin  - Patient reports a week of constant central chest pain, was in SVT with EMS, an has HS troponin 104, then 110 in ED  - She had a similar presentation in January, had low-risk stress test, followed-up with cardiology and continued on statin and ASA  - Continue cardiac monitoring, repeat troponin, continue ASA and statin   6. Elevated d-dimer  - Presents with 1 wk of constant central chest pain, found to have d-dimer of 2.46 and INR 1.1 despite reported adherence with warfarin  - She is not hypoxic or tachypneic and there is no evidence for DVT  - Start IV heparin and continue warfarin with pharmacy assistance    DVT prophylaxis: IV heparin, warfarin  Code Status: Full  Family Communication: Discussed with patient Disposition Plan: Still undergoing workup for fever and PSVT, diagnosis not established yet  Consults called: None  Admission status: Observation     Vianne Bulls, MD [facility] Pager: See [email or web address]  If 7AM-7PM, please contact the daytime attending [email or web address]  08/28/2019, 12:09 AM

## 2019-08-28 NOTE — ED Notes (Signed)
Dr. Myna Hidalgo Notified of Lactic values and Pt's report of chest pain with cough of 10.

## 2019-08-28 NOTE — ED Notes (Signed)
Monitor displays X2 unplugged intermittently, cuts off and then restarts. Reported same to be look at by IT

## 2019-08-28 NOTE — Progress Notes (Signed)
Progress Note    Monique Henderson  TDD:[id number] DOB: Sep 09, 1950  DOA: 08/27/2019 PCP: [name], MD    Brief Narrative:   Chief complaint: chest pain/cough  Medical records reviewed and are as summarized below:  Monique Henderson is an 69 y.o. female past medical history significant for chronic kidney disease previously on dialysis, paroxysmal atrial fibrillation on Coumadin, CVA, hypertension presents emergency department chief complaint persistent cough with worsening chest pain.  Work-up in the emergency department feels fever, tachypnea, elevated troponin paroxysmal SVT.  Assessment/Plan:   Principal Problem:   Paroxysmal SVT (supraventricular tachycardia) (HCC) Active Problems:   AF (paroxysmal atrial fibrillation) (HCC)   Atypical chest pain   Elevated troponin   Hyperkalemia   CKD (chronic kidney disease), stage IV (HCC)   Chronic obstructive lung disease (HCC)   SIRS (systemic inflammatory response syndrome) (HCC)   Positive D dimer   H/O: stroke   #1.  Paroxysmal SVT/paroxysmal atrial fibrillation.  It was noted patient was in SVT with EMS and was treated with a [place] prior to admission.  In the emergency department she is in sinus rhythm with a rate of 90s.  Home medications include Coumadin and she reports compliance however her INR is only 1.1.  EKG this morning reveals sinus tachycardia at a rate of 120. chads-vasc score 5.  IV heparin was initiated and Coumadin continued.  Home medications include metoprolol -Continue metoprolol -Heparin per pharmacy until INR therapeutic -Coumadin -Telemetry  #2.  Elevated troponin/chest pain.  Likely demand from #1.  Chest pain appears pleuritic.  As noted above she was in SVT when EMS arrived and chart review indicates a history of elevated troponin.  She had a low risk stress test in January.  Home medications include aspirin and statin.  Troponin now trending down.  Chest pain is reproducible -Continue home  statin and aspirin -Telemetry -Supportive therapy  #3.  SIRS.  Patient presents with fever but no leukocytosis.  Lactic acid and pro calcitonin within the limits of normal.  Chest x-ray with no appreciable signs of infiltrate, COVID-19 negative, urinalysis with small leukocytes rare bacteria negative nitrite.  Antibiotics were initiated on admission -We will continue antibiotics for now -Monitor closely have a low threshold for stopping antibiotics -Consider CT angio of the chest as chart review indicates she had a CT angio of the chest in January which revealed stable small to moderate size left pleural effusion and persistent moderate severity consolidation throughout the left lower lobe, noting that she is not tachypneic or hypoxic. -Follow blood cultures  #4.  Chronic kidney disease stage IV.  Creatinine was 3.16 on admission with a potassium of 5.4.  This morning creatinine trended down and potassium is within the limits of normal.  She reports being taken off of hemodialysis 2 weeks ago. -Hold nephrotoxins as able -Monitor urine output -Recheck in the morning  #5.  Hyperkalemia.  Resolved  #6.  History of stroke.  Home medications include aspirin and statin.  No deficits -Continue home meds -PT  Family Communication/Anticipated D/C date and plan/Code Status   DVT prophylaxis: heparin and coumadin. Code Status: Full Code.  Family Communication: Patient at bedside Disposition Plan: Discharge to home once afebrile for 24 hours and heart rate stabilizes   Medical Consultants:    None.   Anti-Infectives:    None  Subjective:   Awake alert complains of chest pain with cough.  Denies nausea vomiting or abdominal pain.  Objective:  Vitals:   08/28/19 0715 08/28/19 0730 08/28/19 0745 08/28/19 0746  BP: (!) 164/90 (!) 180/93 (!) 185/94   Pulse: 79 78  76  Resp: (!) 22 14  (!) 25  Temp:      TempSrc:      SpO2: 100% 97%  98%  Weight:      Height:         Intake/Output Summary (Last 24 hours) at 08/28/2019 1238 Last data filed at 08/28/2019 0240 Gross per 24 hour  Intake 1050 ml  Output -  Net 1050 ml   Filed Weights   08/27/19 1949  Weight: 45.4 kg    Exam: General: Thin chronically ill-appearing no acute distress CV: Regular rate and rhythm no murmur gallop or rub trace lower extremity edema, chest tender to palpation Respiratory: No increased work of breathing breath sounds are distant but clear I hear no wheezing no crackles Abdomen: Nondistended soft positive bowel sounds throughout nontender to palpation no guarding or rebounding Musculoskeletal joints without swelling/erythema, full range of motion Neuro: Awake alert oriented x3 speech clear facial symmetry cranial nerves II through XII grossly intact  Data Reviewed:   I have personally reviewed following labs and imaging studies:  Labs: Labs show the following:   Basic Metabolic Panel: Recent Labs  Lab 08/27/19 2050 08/28/19 0316  NA 138 140  K 5.4* 3.7  CL 102 103  CO2 25 27  GLUCOSE 126* 134*  BUN 37* 33*  CREATININE 3.16* 2.92*  CALCIUM 8.9 9.0   GFR Estimated Creatinine Clearance: 13.6 mL/min (A) (by C-G formula based on SCr of 2.92 mg/dL (H)). Liver Function Tests: No results for input(s): AST, ALT, ALKPHOS, BILITOT, PROT, ALBUMIN in the last 168 hours. No results for input(s): LIPASE, AMYLASE in the last 168 hours. No results for input(s): AMMONIA in the last 168 hours. Coagulation profile Recent Labs  Lab 08/27/19 09-17-17  INR 1.1    CBC: Recent Labs  Lab 08/27/19 2050 08/28/19 0316  WBC 7.3 7.2  NEUTROABS 5.5 4.7  HGB 10.9* 11.0*  HCT 36.7 37.4  MCV 81.0 81.5  PLT 288 307   Cardiac Enzymes: No results for input(s): CKTOTAL, CKMB, CKMBINDEX, TROPONINI in the last 168 hours. BNP (last 3 results) No results for input(s): PROBNP in the last 8760 hours. CBG: No results for input(s): GLUCAP in the last 168 hours. D-Dimer: Recent Labs     08/27/19 09-17-09  DDIMER 2.46*   Hgb A1c: No results for input(s): HGBA1C in the last 72 hours. Lipid Profile: No results for input(s): CHOL, HDL, LDLCALC, TRIG, CHOLHDL, LDLDIRECT in the last 72 hours. Thyroid function studies: Recent Labs    08/27/19 09-17-2209  TSH 1.396   Anemia work up: No results for input(s): VITAMINB12, FOLATE, FERRITIN, TIBC, IRON, RETICCTPCT in the last 72 hours. Sepsis Labs: Recent Labs  Lab 08/27/19 2050 08/27/19 2353 08/28/19 0058 08/28/19 0316 08/28/19 0855  PROCALCITON  --   --   --  1.82  --   WBC 7.3  --   --  7.2  --   LATICACIDVEN  --  1.1 4.6* 1.3 1.1    Microbiology Recent Results (from the past 240 hour(s))  Blood culture (routine x 2)     Status: None (Preliminary result)   Collection Time: 08/27/19 10:04 PM   Specimen: BLOOD RIGHT HAND  Result Value Ref Range Status   Specimen Description BLOOD RIGHT HAND  Final   Special Requests   Final    BOTTLES DRAWN  AEROBIC AND ANAEROBIC Blood Culture results may not be optimal due to an inadequate volume of blood received in culture bottles   Culture NO GROWTH < 12 HOURS  Final   Report Status PENDING  Incomplete  Blood culture (routine x 2)     Status: None (Preliminary result)   Collection Time: 08/27/19 10:41 PM   Specimen: BLOOD  Result Value Ref Range Status   Specimen Description BLOOD RIGHT ANTECUBITAL  Final   Special Requests   Final    BOTTLES DRAWN AEROBIC AND ANAEROBIC Blood Culture adequate volume   Culture NO GROWTH < 12 HOURS  Final   Report Status PENDING  Incomplete  SARS CORONAVIRUS 2 (TAT 6-24 HRS) Nasopharyngeal Nasopharyngeal Swab     Status: None   Collection Time: 08/27/19 11:53 PM   Specimen: Nasopharyngeal Swab  Result Value Ref Range Status   SARS Coronavirus 2 NEGATIVE NEGATIVE Final    Comment: (NOTE) SARS-CoV-2 target nucleic acids are NOT DETECTED. The SARS-CoV-2 RNA is generally detectable in upper and lower respiratory specimens during the acute phase of  infection. Negative results do not preclude SARS-CoV-2 infection, do not rule out co-infections with other pathogens, and should not be used as the sole basis for treatment or other patient management decisions. Negative results must be combined with clinical observations, patient history, and epidemiological information. The expected result is Negative. Fact Sheet for Patients: SugarRoll.be Fact Sheet for Healthcare Providers: https://www.woods-mathews.com/ This test is not yet approved or cleared by the [place] FDA and  has been authorized for detection and/or diagnosis of SARS-CoV-2 by FDA under an Emergency Use Authorization (EUA). This EUA will remain  in effect (meaning this test can be used) for the duration of the COVID-19 declaration under Section 56 4(b)(1) of the Act, 21 U.S.C. section 360bbb-3(b)(1), unless the authorization is terminated or revoked sooner. Performed at [facility], [place] [address] [place], [place] [address]     Procedures and diagnostic studies:  DG Chest 2 View  Result Date: 08/27/2019 CLINICAL DATA:  Chest pain with deep inspiration EXAM: CHEST - 2 VIEW COMPARISON:  06/08/2019 FINDINGS: Cardiac shadow is mildly enlarged. Postsurgical changes are again seen and stable. Previously seen dialysis catheter has been removed in the interval. The lungs are well aerated bilaterally with the exception of the left lung base which demonstrates some chronic blunting of the costophrenic angle and atelectatic changes similar to that seen on prior exam. No bony abnormality is noted. IMPRESSION: Small left effusion and atelectatic changes similar to that seen on prior exam. Electronically Signed   By: Inez Catalina M.D.   On: 08/27/2019 20:08    Medications:   . aspirin EC  81 mg Oral Daily  . metoprolol tartrate  50 mg Oral BID  . rosuvastatin  10 mg Oral q1800  . sodium chloride flush  3 mL Intravenous Q12H  .  warfarin  5 mg Oral ONCE-1600  . Warfarin - Pharmacist Dosing Inpatient   Does not apply q1800   Continuous Infusions: . azithromycin Stopped (08/28/19 8546)  . cefTRIAXone (ROCEPHIN)  IV Stopped (08/28/19 0559)  . heparin 850 Units/hr (08/28/19 1108)     LOS: 0 days   Radene Gunning NP  [facility]   How to contact the [facility] Attending or Consulting provider [place] or covering provider during after hours [place], for this patient?  1. Check the care team in [facility] and look for a) attending/consulting [place] provider listed and b) the  [place] team listed 2. Log into [email or web address] and use [place]'s universal password to access. If you do not have the password, please contact the hospital operator. 3. Locate the [facility] provider you are looking for under [facility] and page to a number that you can be directly reached. 4. If you still have difficulty reaching the provider, please page the [facility] (Director on Call) for the Hospitalists listed on amion for assistance.  08/28/2019, 12:38 PM

## 2019-08-28 NOTE — Progress Notes (Signed)
[place] for Heparin / Warfarin Indication: atrial fibrillation  Allergies  Allergen Reactions  . Shrimp [Shellfish Allergy] Shortness Of Breath  . Bactroban [Mupirocin] Other (See Comments)    "Sores in nose"  . Vicodin [Hydrocodone-Acetaminophen] Itching and Nausea And Vomiting    This is patient's home medication  . Lisinopril Cough  . Tylenol [Acetaminophen] Itching    Patient Measurements: Height: 5\' 3"  (160 cm) Weight: 100 lb (45.4 kg) IBW/kg (Calculated) : 52.4   Vital Signs: BP: 180/93 (03/29 0730) Pulse Rate: 78 (03/29 0730)  Labs: Recent Labs    08/27/19 2050 08/27/19 2119 08/27/19 2211 08/28/19 0059 08/28/19 0316 08/28/19 0855  HGB 10.9*  --   --   --  11.0*  --   HCT 36.7  --   --   --  37.4  --   PLT 288  --   --   --  307  --   LABPROT  --  14.2  --   --   --   --   INR  --  1.1  --   --   --   --   HEPARINUNFRC  --   --   --   --   --  <0.10*  CREATININE 3.16*  --   --   --  2.92*  --   TROPONINIHS 104*  --  110* 79*  --   --     Estimated Creatinine Clearance: 13.6 mL/min (A) (by C-G formula based on SCr of 2.92 mg/dL (H)).  Assessment: 69 year old female presented to the ED with CP and cough. She continues on heparin bridge to warfarin. Heparin level is undetectable. No issues noted. CBC is stable.  PTA warfarin: 2.5mg  alternating with 5mg   Goal of Therapy:  INR 2-3 Heparin level 0.3-0.7 units/ml Monitor platelets by anticoagulation protocol: Yes    Plan:  Heparin bolus 1500 units IV x 1 Increase heparin gtt 850 units/hr Check an 8 hr heparin level Daily INR, heparin level and CBC Repeat warfarin 5mg  PO x 1 tonight  Salome Arnt, PharmD, BCPS Clinical Pharmacist Please see AMION for all pharmacy numbers 08/28/2019 10:54 AM

## 2019-08-28 NOTE — Progress Notes (Signed)
ANTICOAGULATION CONSULT NOTE - Initial Consult  Pharmacy Consult for Heparin / Warfarin Indication: atrial fibrillation  Allergies  Allergen Reactions  . Shrimp [Shellfish Allergy] Shortness Of Breath  . Bactroban [Mupirocin] Other (See Comments)    "Sores in nose"  . Vicodin [Hydrocodone-Acetaminophen] Itching and Nausea And Vomiting    This is patient's home medication  . Lisinopril Cough  . Tylenol [Acetaminophen] Itching    Patient Measurements: Height: 5\' 3"  (160 cm) Weight: 100 lb (45.4 kg) IBW/kg (Calculated) : 52.4   Vital Signs: Temp: 100.6 F (38.1 C) (03/28 1950) Temp Source: Oral (03/28 1950) BP: 154/91 (03/28 2330) Pulse Rate: 67 (03/28 2330)  Labs: Recent Labs    08/27/19 2050 08/27/19 2119 08/27/19 2211  HGB 10.9*  --   --   HCT 36.7  --   --   PLT 288  --   --   LABPROT  --  14.2  --   INR  --  1.1  --   CREATININE 3.16*  --   --   TROPONINIHS 104*  --  110*    Estimated Creatinine Clearance: 12.6 mL/min (A) (by C-G formula based on SCr of 3.16 mg/dL (H)).   Medical History: Past Medical History:  Diagnosis Date  . Atelectasis 2002   Bilateral  . Bone spur 2008   Right calcaneal foot spur  . Breast cancer ([place]) 2004   Ductal carcinoma in situ of the left breast; S/P left partial mastectomy 02/26/2003; S/P re-excision of cranial and lateral margins11/18/2004.radiation  . Breast cancer ([place]) 09/21/2012   right breast/ last radiation treatment 03/22/2013  . Chronic kidney disease, stage IV (severe) ([place]) 10/10/2007  . 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 Autumn Messing; S/P re-excision of superior and inferior margins 10/27/2012.   Marland Kitchen GERD (gastroesophageal reflux disease)   . 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 Hot flashes   . Hx of radiation therapy 2005   left breast  . Hx of radiation therapy 01/11/13- 03/22/13   right breast 5760 cGy 30 sessions  .  Hypertension   . Low back pain   . Lumbar spinal stenosis    S/P lumbar decompressive laminectomy, fusion and plating for lumbar spinal stensosis  . Normocytic anemia    With thrombocytosis  . Osteoarthritis   . Personal history of radiation therapy   . Right ureteral stone 2002  . Shortness of breath    from pain  . Uterine fibroid   . Wears dentures    top    Assessment: 69 year old female supposedly on warfarin at home for Afib.  INR on admission 1.1 on alternating 5 / 2.5 mg dose of warfarin (last known).  To begin heparin until INR therapeutic  With ESRD  Goal of Therapy:  Heparin level 0.3-0.7 units/ml Monitor platelets by anticoagulation protocol: Yes  INR 2 to 3   Plan:  Warfarin 5 mg po x 1 dose tonight Heparin 2500 units iv bolus x 1 Heparin drip at 650 units / hr Heparin level at 9 am Daily INR, CBC, heparin level  Thank you Anette Guarneri, PharmD 08/28/2019,12:04 AM

## 2019-08-28 NOTE — ED Notes (Signed)
Lunch Tray Ordered @ 1010. 

## 2019-08-28 NOTE — Progress Notes (Signed)
  Echocardiogram 2D Echocardiogram has been performed.  Monique Henderson A [name] 08/28/2019, 4:40 PM

## 2019-08-28 NOTE — ED Notes (Signed)
Admitting paged to Rn per his request

## 2019-08-28 NOTE — Progress Notes (Signed)
[place] for Heparin / Warfarin Indication: atrial fibrillation  Allergies  Allergen Reactions  . Shrimp [Shellfish Allergy] Shortness Of Breath  . Bactroban [Mupirocin] Other (See Comments)    "Sores in nose"  . Vicodin [Hydrocodone-Acetaminophen] Itching and Nausea And Vomiting    This is patient's home medication  . Lisinopril Cough  . Tylenol [Acetaminophen] Itching    Patient Measurements: Height: 5\' 3"  (160 cm) Weight: 100 lb (45.4 kg) IBW/kg (Calculated) : 52.4  Heparin Dosing Weight: 45.4 kg  Vital Signs: Temp: 98.9 F (37.2 C) (03/29 1758) Temp Source: Oral (03/29 1758) BP: 208/105 (03/29 1910) Pulse Rate: 82 (03/29 1910)  Labs: Recent Labs    08/27/19 2050 08/27/19 2119 08/27/19 2211 08/28/19 0059 08/28/19 0316 08/28/19 0855 08/28/19 1909  HGB 10.9*  --   --   --  11.0*  --   --   HCT 36.7  --   --   --  37.4  --   --   PLT 288  --   --   --  307  --   --   LABPROT  --  14.2  --   --   --   --   --   INR  --  1.1  --   --   --   --   --   HEPARINUNFRC  --   --   --   --   --  <0.10* <0.10*  CREATININE 3.16*  --   --   --  2.92*  --   --   TROPONINIHS 104*  --  110* 79*  --   --   --     Estimated Creatinine Clearance: 13.6 mL/min (A) (by C-G formula based on SCr of 2.92 mg/dL (H)).  Assessment: 69 year old female presented to the ED with CP and cough. She continues on heparin bridge to warfarin.   Heparin level 8 hrs after heparin 1500 units IV bolus X 1, followed by increasing heparin infusion to 850 units/hr, remains undetectable (<0.10 units/ml). CBC stable. Per RN, no issues with IV or bleeding observed.  PTA warfarin: 2.5 mg daily alternating with 5 mg daily  Goal of Therapy:  INR 2-3 Heparin level 0.3-0.7 units/ml Monitor platelets by anticoagulation protocol: Yes    Plan:  Heparin bolus 1350 units IV x 1 Increase heparin infusion to 1000 units/hr Check 8-hr heparin level Warfarin 5mg  PO x 1  tonight (completed) Monitor daily heparin level, INR, CBC Monitor for signs/symptoms of bleeding  Gillermina Hu, PharmD, BCPS, [facility] Clinical Pharmacist 08/28/2019 7:51 PM

## 2019-08-29 DIAGNOSIS — I471 Supraventricular tachycardia: Secondary | ICD-10-CM | POA: Diagnosis not present

## 2019-08-29 LAB — URINE CULTURE

## 2019-08-29 LAB — BASIC METABOLIC PANEL
Anion gap: 12 (ref 5–15)
BUN: 30 mg/dL — ABNORMAL HIGH (ref 8–23)
CO2: 24 mmol/L (ref 22–32)
Calcium: 8.9 mg/dL (ref 8.9–10.3)
Chloride: 104 mmol/L (ref 98–111)
Creatinine, Ser: 2.7 mg/dL — ABNORMAL HIGH (ref 0.44–1.00)
GFR calc Af Amer: 20 mL/min — ABNORMAL LOW (ref >60)
GFR calc non Af Amer: 18 mL/min — ABNORMAL LOW (ref >60)
Glucose, Bld: 99 mg/dL (ref 70–99)
Potassium: 3.6 mmol/L (ref 3.5–5.1)
Sodium: 140 mmol/L (ref 135–145)

## 2019-08-29 LAB — CBC
HCT: 35.7 % — ABNORMAL LOW (ref 36.0–46.0)
Hemoglobin: 10.6 g/dL — ABNORMAL LOW (ref 12.0–15.0)
MCH: 24.4 pg — ABNORMAL LOW (ref 26.0–34.0)
MCHC: 29.7 g/dL — ABNORMAL LOW (ref 30.0–36.0)
MCV: 82.3 fL (ref 80.0–100.0)
Platelets: 283 10*3/uL (ref 150–400)
RBC: 4.34 MIL/uL (ref 3.87–5.11)
RDW: 19.7 % — ABNORMAL HIGH (ref 11.5–15.5)
WBC: 5.9 10*3/uL (ref 4.0–10.5)
nRBC: 0 % (ref 0.0–0.2)

## 2019-08-29 LAB — PROTIME-INR
INR: 1.1 (ref 0.8–1.2)
Prothrombin Time: 14.5 seconds (ref 11.4–15.2)

## 2019-08-29 LAB — HEPARIN LEVEL (UNFRACTIONATED)
Heparin Unfractionated: <0.1 IU/mL — ABNORMAL LOW (ref 0.30–0.70)
Heparin Unfractionated: 0.23 IU/mL — ABNORMAL LOW (ref 0.30–0.70)

## 2019-08-29 MED ORDER — HEPARIN BOLUS VIA INFUSION
1500.0000 [IU] | Freq: Once | INTRAVENOUS | Status: AC
  Administered 2019-08-29: 1500 [IU] via INTRAVENOUS
  Filled 2019-08-29: qty 1500

## 2019-08-29 MED ORDER — WARFARIN SODIUM 7.5 MG PO TABS
7.5000 mg | ORAL_TABLET | Freq: Once | ORAL | Status: AC
  Administered 2019-08-29: 7.5 mg via ORAL
  Filled 2019-08-29: qty 1

## 2019-08-29 NOTE — Care Management Obs Status (Signed)
[place] NOTIFICATION   Patient Details  Name: Monique Henderson MRN: [id number] Date of Birth: 07/19/1950   Medicare Observation Status Notification Given:  Yes    Bethena Roys, RN 08/29/2019, 11:27 AM

## 2019-08-29 NOTE — Progress Notes (Signed)
[place] for Heparin / Warfarin Indication: atrial fibrillation  Allergies  Allergen Reactions  . Shrimp [Shellfish Allergy] Shortness Of Breath  . Bactroban [Mupirocin] Other (See Comments)    "Sores in nose"  . Vicodin [Hydrocodone-Acetaminophen] Itching and Nausea And Vomiting    This is patient's home medication  . Lisinopril Cough  . Tylenol [Acetaminophen] Itching    Patient Measurements: Height: 5\' 3"  (160 cm) Weight: 105 lb 8 oz (47.9 kg) IBW/kg (Calculated) : 52.4  Heparin Dosing Weight: 45.4 kg  Vital Signs: Temp: 98.7 F (37.1 C) (03/30 0351) Temp Source: Oral (03/30 0351) BP: 178/94 (03/30 0351) Pulse Rate: 71 (03/30 0351)  Labs: Recent Labs    08/27/19 2050 08/27/19 2050 08/27/19 2119 08/27/19 2211 08/28/19 0059 08/28/19 0316 08/28/19 0855 08/28/19 1909 08/29/19 0356  HGB 10.9*   < >  --   --   --  11.0*  --   --  10.6*  HCT 36.7  --   --   --   --  37.4  --   --  35.7*  PLT 288  --   --   --   --  307  --   --  283  LABPROT  --   --  14.2  --   --   --   --   --  14.5  INR  --   --  1.1  --   --   --   --   --  1.1  HEPARINUNFRC  --   --   --   --   --   --  <0.10* <0.10* <0.10*  CREATININE 3.16*  --   --   --   --  2.92*  --   --  2.70*  TROPONINIHS 104*  --   --  110* 79*  --   --   --   --    < > = values in this interval not displayed.    Estimated Creatinine Clearance: 15.5 mL/min (A) (by C-G formula based on SCr of 2.7 mg/dL (H)).  Assessment: 69 year old female presented to the ED with CP and cough. She continues on heparin bridge to warfarin.   Heparin level 8 hrs after heparin 1500 units IV bolus X 1, followed by increasing heparin infusion to 850 units/hr, remains undetectable (<0.10 units/ml). CBC stable. Per RN, no issues with IV or bleeding observed.  PTA warfarin: 2.5 mg daily alternating with 5 mg daily  3/30 AM update:  Heparin level low  Goal of Therapy:  INR 2-3 Heparin level  0.3-0.7 units/ml Monitor platelets by anticoagulation protocol: Yes    Plan:  Heparin bolus 1500 units IV x 1 Increase heparin infusion to 1150 units/hr Check 8-hr heparin level Monitor daily heparin level, INR, CBC Monitor for signs/symptoms of bleeding  Narda Bonds, PharmD, BCPS Clinical Pharmacist Phone: [phone]

## 2019-08-29 NOTE — TOC Benefit Eligibility Note (Signed)
Transition of Care [facility]) Benefit Eligibility Note    Patient Details  Name: Monique Henderson MRN: [id number] Date of Birth: 11/19/1950   Medication/Dose: Eliquis 2.38m  Covered?: Yes  Tier: 3 Drug  Prescription Coverage Preferred Pharmacy: Any Retail Pharmacy  Spoke with Person/Company/Phone Number:: MVerdis Frederickson/ [employer]/ [phone] Co-Pay: 4.00 for a 30 day supply  Prior Approval: No  Deductible: Met(LPS)       IOrbie PyoPhone Number: 08/29/2019, 10:46 AM

## 2019-08-29 NOTE — Progress Notes (Addendum)
[place] for Heparin / Warfarin Indication: atrial fibrillation  Allergies  Allergen Reactions  . Shrimp [Shellfish Allergy] Shortness Of Breath  . Bactroban [Mupirocin] Other (See Comments)    "Sores in nose"  . Vicodin [Hydrocodone-Acetaminophen] Itching and Nausea And Vomiting    This is patient's home medication  . Lisinopril Cough  . Tylenol [Acetaminophen] Itching    Patient Measurements: Height: 5\' 3"  (160 cm) Weight: 105 lb 8 oz (47.9 kg) IBW/kg (Calculated) : 52.4  Heparin Dosing Weight: 45.4 kg  Vital Signs: Temp: 98.4 F (36.9 C) (03/30 0802) Temp Source: Oral (03/30 0802) BP: 205/95 (03/30 0802) Pulse Rate: 78 (03/30 0802)  Labs: Recent Labs    08/27/19 2050 08/27/19 2050 08/27/19 2119 08/27/19 2211 08/28/19 0059 08/28/19 0316 08/28/19 0855 08/28/19 1909 08/29/19 0356  HGB 10.9*   < >  --   --   --  11.0*  --   --  10.6*  HCT 36.7  --   --   --   --  37.4  --   --  35.7*  PLT 288  --   --   --   --  307  --   --  283  LABPROT  --   --  14.2  --   --   --   --   --  14.5  INR  --   --  1.1  --   --   --   --   --  1.1  HEPARINUNFRC  --   --   --   --   --   --  <0.10* <0.10* <0.10*  CREATININE 3.16*  --   --   --   --  2.92*  --   --  2.70*  TROPONINIHS 104*  --   --  110* 79*  --   --   --   --    < > = values in this interval not displayed.    Estimated Creatinine Clearance: 15.5 mL/min (A) (by C-G formula based on SCr of 2.7 mg/dL (H)).  Assessment: 35 yoF on warfarin PTA for AFib admitted with CP and SIRS. INR 1.1 on admit so heparin bridge started.  Heparin adjusted this morning, INR remains 1.1, CBC stable.  PTA warfarin: 2.5 mg daily alternating with 5 mg daily   Goal of Therapy:  INR 2-3 Heparin level 0.3-0.7 units/ml Monitor platelets by anticoagulation protocol: Yes    Plan:  -Heparin 1150 units/h - recheck this afternoon -Warfarin 7.5mg  PO x1 tonight -Daily INR, heparin level,  CBC  ADDENDUM: Heparin level remains low but now detectable at 0.23. Primary team considering transition to apixaban for compliance purposes.  Plan: -Increase heparin to 1350 units/h -Recheck heparin level with am labs   Arrie Senate, PharmD, BCPS Clinical Pharmacist [phone] Please check AMION for all [place] numbers 08/29/2019

## 2019-08-29 NOTE — Progress Notes (Signed)
Patient admitted with hypertension and tachycardia. PA into see patient and aware of intermittent hypertension and tachycardia. No change from initial assessment. Plan is to monitor for now and see how patient does with medications ordered. Changed will be made by PA in response to patients VS.

## 2019-08-29 NOTE — Care Management (Signed)
08-29-19 1008 Benefits check submitted for Eliquis. Case Manager will follow for cost. Graves-Bigelow, Ocie Cornfield, RN,BSN Case Manager

## 2019-08-29 NOTE — Evaluation (Signed)
Physical Therapy Evaluation Patient Details Name: Monique Henderson MRN: [id number] DOB: 03-05-1951 Today's Date: 08/29/2019   History of Present Illness  69 y/o female w/ PMHx: CKD IV, was on HD states taken off 2 weeks prior to (3/28), PAF on warfarin, CVA, HTN, breast cancer w/ lumpectomy, low back pain w/ multi back surgeries, Hep C, anemia, presented to ED w/ chest pain and cough, found to have fever, tachypnea, elevated troponin paroxysmal SVT. Marland KitchenLimited echo found L large pleural effusion  Clinical Impression   Pt admitted with above hx and above dx. PTA was living home in apartment and reports was quite independent, able to complete all ADLs and IADLs on her own including groceries and laundry. She reports was able to ambulate with no AD either. This am doing very well with mobility able to complete bed mob and transfers with mod I, able to ambulate approx 112ft with no AD and SBA, no frank balance loses noted and no c/o increased sx.  SATURATION QUALIFICATIONS: (This note is used to comply with regulatory documentation for home oxygen)  Patient Saturations on Room Air at Rest = 99%  Patient Saturations on Room Air while Ambulating = min 91%  Patient Saturations on 0 Liters of oxygen while Ambulating = min 91%  Please briefly explain why patient needs home oxygen: At this time pt does not appear to require supplemental 02 to maintain 02 sats in 90s.  Pt will benefit from continued PT tx while in hospital to address independence and activity tolerance also safety with all functional mobility/atcivities. At dc pt will be able to return home with no further PT follow up.     Follow Up Recommendations No PT follow up    Equipment Recommendations  None recommended by PT    Recommendations for Other Services       Precautions / Restrictions Precautions Precautions: Fall Precaution Comments: pleural effusion on heparin Restrictions Weight Bearing Restrictions: No       Mobility  Bed Mobility Overal bed mobility: Modified Independent                Transfers Overall transfer level: Modified independent               General transfer comment: able to transfer from bed to [facility] with mod I  Ambulation/Gait Ambulation/Gait assistance: Supervision Gait Distance (Feet): 130 Feet Assistive device: None Gait Pattern/deviations: Step-through pattern Gait velocity: dec   General Gait Details: ambulated on room air iwth min sat noted 91%  Stairs            Wheelchair Mobility    Modified Rankin (Stroke Patients Only)       Balance Overall balance assessment: Mild deficits observed, not formally tested                                           Pertinent Vitals/Pain Pain Assessment: No/denies pain    Home Living Family/patient expects to be discharged to:: Private residence   Available Help at Discharge: Available PRN/intermittently;Family Type of Home: Apartment Home Access: Level entry     Home Layout: Two level Home Equipment: [place] - 2 wheels;Cane - single point Additional Comments: She reports that stairs are inside apartment and she rides the chair lift    Prior Function Level of Independence: Independent         Comments: states that she is  independent and gets around on her own in home, able to go do own groceries and does laundry also     Hand Dominance   Dominant Hand: Right    Extremity/Trunk Assessment   Upper Extremity Assessment Upper Extremity Assessment: Overall WFL for tasks assessed    Lower Extremity Assessment Lower Extremity Assessment: Overall WFL for tasks assessed    Cervical / Trunk Assessment Cervical / Trunk Assessment: Normal  Communication   Communication: No difficulties  Cognition Arousal/Alertness: Awake/alert Behavior During Therapy: WFL for tasks assessed/performed Overall Cognitive Status: Within Functional Limits for tasks assessed                                  General Comments: seems to eb within functional      General Comments General comments (skin integrity, edema, etc.): on RA VSS    Exercises     Assessment/Plan    PT Assessment Patient needs continued PT services  PT Problem List Decreased activity tolerance       PT Treatment Interventions Gait training;Stair training;Functional mobility training;Therapeutic activities;Therapeutic exercise;Balance training;Neuromuscular re-education;Patient/family education    PT Goals (Current goals can be found in the Care Plan section)  Acute Rehab PT Goals Patient Stated Goal: go home PT Goal Formulation: With patient Time For Goal Achievement: 09/12/19 Potential to Achieve Goals: Good    Frequency Min 3X/week   Barriers to discharge        Co-evaluation               AM-PAC PT "6 Clicks" Mobility  Outcome Measure Help needed turning from your back to your side while in a flat bed without using bedrails?: None Help needed moving from lying on your back to sitting on the side of a flat bed without using bedrails?: None Help needed moving to and from a bed to a chair (including a wheelchair)?: None Help needed standing up from a chair using your arms (e.g., wheelchair or bedside chair)?: None Help needed to walk in hospital room?: A Little Help needed climbing 3-5 steps with a railing? : A Little 6 Click Score: 22    End of Session   Activity Tolerance: Patient tolerated treatment well Patient left: in bed;with call bell/phone within reach   PT Visit Diagnosis: Other abnormalities of gait and mobility (R26.89)    Time: 1517-6160 PT Time Calculation (min) (ACUTE ONLY): 27 min   Charges:   PT Evaluation $PT Eval Moderate Complexity: 1 Mod PT Treatments $Gait Training: 8-22 mins       Horald Chestnut, PT   Delford Field 08/29/2019, 2:37 PM

## 2019-08-29 NOTE — Progress Notes (Signed)
Progress Note    Monique Henderson  MHD:[id number] DOB: 1950/08/10  DOA: 08/27/2019 PCP: [name], MD    Brief Narrative:   Chief complaint: chest pain/cough  Medical records reviewed and are as summarized below:  Monique Henderson is an 69 y.o. female with a past medical history significant for chronic kidney disease previously on dialysis, paroxysmal atrial fibrillation on Coumadin, CVA, hypertension presented to the emergency department March 29 chief complaint persistent cough with worsening chest pain.  Work-up in the emergency department revealed fever, tachypnea, elevated troponin and paroxysmal SVT.  She was given adenison per EMS and her heart rate improved.  Assessment/Plan:   Principal Problem:   Paroxysmal SVT (supraventricular tachycardia) (HCC) Active Problems:   AF (paroxysmal atrial fibrillation) (HCC)   Atypical chest pain   Elevated troponin   Hyperkalemia   CKD (chronic kidney disease), stage IV (HCC)   Chronic obstructive lung disease (HCC)   SIRS (systemic inflammatory response syndrome) (HCC)   Positive D dimer   H/O: stroke   Pleural effusion   SVT (supraventricular tachycardia) ([place])  1.  Paroxysmal SVT/paroxysmal atrial fibrillation.  No further episodes. Home medications include Coumadin and she reports compliance however her INR remains low.  Range 70-95. chads-vasc score 5.  IV heparin was initiated and Coumadin continued.  Home medications include metoprolol. Will check benefits to see if Eliquis better option as she does not comply with inr checks. -Continue metoprolol -Heparin per pharmacy for now -Coumadin for now -consider eliquis -Telemetry  #2.  Elevated troponin/chest pain. chronic.  Likely demand from #1.  Chest pain appears pleuritic.  She had a low risk stress test in January.  Home medications include aspirin and statin.  Troponin now trending down.  Chest pain is reproducible -Continue home statin and  aspirin -Telemetry -Supportive therapy  #3.  SIRS.  Patient presented with fever but no leukocytosis.  Lactic acid and pro calcitonin within the limits of normal.  Chest x-ray with no appreciable signs of infiltrate, COVID-19 negative, urinalysis with small leukocytes rare bacteria negative nitrite.  Blood cultures with no growth to date  Antibiotics were initiated on admission -We will continue antibiotics for now -Monitor closely have a low threshold for stopping antibiotics -Consider CT angio of the chest as chart review indicates she had a CT angio of the chest in January which revealed stable small to moderate size left pleural effusion and persistent moderate severity consolidation throughout the left lower lobe, noting that she is not tachypneic or hypoxic.  Of note chart review indicates last hospitalization underwent thoracentesis and had a stroke shortly thereafter -monitor -if worsens consider thoracentesis.   #4.  Chronic kidney disease stage IV.  Creatinine was 3.16 on admission with a potassium of 5.4.  She reports being taken off of hemodialysis 2 weeks ago per Dr. Moshe Cipro. -Hold nephrotoxins as able -Monitor urine output -Recheck in the morning  #5.  Hyperkalemia.  Resolved  #6.  History of stroke.  Home medications include aspirin and statin.  No deficits -Continue home meds -PT    Family Communication/Anticipated D/C date and plan/Code Status   DVT prophylaxis: heparin gtt ordered. Code Status: Full Code.  Family Communication: patient at bedside Disposition Plan: home when BP and HR controlled and anti-coagulation strategy determined   Medical Consultants:    None.   Anti-Infectives:    Rocephin 3/29>>  Azithromycin 3/29>>  Subjective:   Awake alert.  Complains of chest pain with movement.  No shortness  of breath  Objective:    Vitals:   08/29/19 0019 08/29/19 0351 08/29/19 0802 08/29/19 1147  BP: (!) 194/99 (!) 178/94 (!) 205/95 (!)  113/93  Pulse: 72 71 78 73  Resp:  17    Temp: 98.8 F (37.1 C) 98.7 F (37.1 C) 98.4 F (36.9 C) 97.7 F (36.5 C)  TempSrc: Oral Oral Oral Oral  SpO2: 99% 99% 99% 98%  Weight:  47.9 kg    Height:        Intake/Output Summary (Last 24 hours) at 08/29/2019 1401 Last data filed at 08/29/2019 1017 Gross per 24 hour  Intake 759.97 ml  Output 900 ml  Net -140.03 ml   Filed Weights   08/27/19 1949 08/29/19 0351  Weight: 45.4 kg 47.9 kg    Exam: General: Thin chronically ill-appearing no acute distress CV: Regular rate and rhythm no murmur gallop or rub trace lower extremity edema, chest tender to palpation Respiratory: No increased work of breathing breath sounds are distant but clear I hear no wheezing no crackles Abdomen: Nondistended soft positive bowel sounds throughout nontender to palpation no guarding or rebounding Musculoskeletal joints without swelling/erythema, full range of motion Neuro: Awake alert oriented x3 speech clear facial symmetry cranial nerves II through XII grossly intact Data Reviewed:   I have personally reviewed following labs and imaging studies:  Labs: Labs show the following:   Basic Metabolic Panel: Recent Labs  Lab 08/27/19 2050 08/27/19 2050 08/28/19 0316 08/29/19 0356  NA 138  --  140 140  K 5.4*   < > 3.7 3.6  CL 102  --  103 104  CO2 25  --  27 24  GLUCOSE 126*  --  134* 99  BUN 37*  --  33* 30*  CREATININE 3.16*  --  2.92* 2.70*  CALCIUM 8.9  --  9.0 8.9   < > = values in this interval not displayed.   GFR Estimated Creatinine Clearance: 15.5 mL/min (A) (by C-G formula based on SCr of 2.7 mg/dL (H)). Liver Function Tests: No results for input(s): AST, ALT, ALKPHOS, BILITOT, PROT, ALBUMIN in the last 168 hours. No results for input(s): LIPASE, AMYLASE in the last 168 hours. No results for input(s): AMMONIA in the last 168 hours. Coagulation profile Recent Labs  Lab 08/27/19 September 13, 2117 08/29/19 0356  INR 1.1 1.1     CBC: Recent Labs  Lab 08/27/19 2050 08/28/19 0316 08/29/19 0356  WBC 7.3 7.2 5.9  NEUTROABS 5.5 4.7  --   HGB 10.9* 11.0* 10.6*  HCT 36.7 37.4 35.7*  MCV 81.0 81.5 82.3  PLT 288 307 283   Cardiac Enzymes: No results for input(s): CKTOTAL, CKMB, CKMBINDEX, TROPONINI in the last 168 hours. BNP (last 3 results) No results for input(s): PROBNP in the last 8760 hours. CBG: No results for input(s): GLUCAP in the last 168 hours. D-Dimer: Recent Labs    08/27/19 09/13/09  DDIMER 2.46*   Hgb A1c: No results for input(s): HGBA1C in the last 72 hours. Lipid Profile: No results for input(s): CHOL, HDL, LDLCALC, TRIG, CHOLHDL, LDLDIRECT in the last 72 hours. Thyroid function studies: Recent Labs    08/27/19 September 13, 2209  TSH 1.396   Anemia work up: No results for input(s): VITAMINB12, FOLATE, FERRITIN, TIBC, IRON, RETICCTPCT in the last 72 hours. Sepsis Labs: Recent Labs  Lab 08/27/19 2050 08/27/19 2353 08/28/19 9485 08/28/19 0316 08/28/19 0855 08/29/19 0356  PROCALCITON  --   --   --  1.82  --   --  WBC 7.3  --   --  7.2  --  5.9  LATICACIDVEN  --  1.1 4.6* 1.3 1.1  --     Microbiology Recent Results (from the past 240 hour(s))  Blood culture (routine x 2)     Status: None (Preliminary result)   Collection Time: 08/27/19 10:04 PM   Specimen: BLOOD RIGHT HAND  Result Value Ref Range Status   Specimen Description BLOOD RIGHT HAND  Final   Special Requests   Final    BOTTLES DRAWN AEROBIC AND ANAEROBIC Blood Culture results may not be optimal due to an inadequate volume of blood received in culture bottles   Culture   Final    NO GROWTH 2 DAYS Performed at [facility], [place] [address] [place], [place] [address]    Report Status PENDING  Incomplete  Blood culture (routine x 2)     Status: None (Preliminary result)   Collection Time: 08/27/19 10:41 PM   Specimen: BLOOD  Result Value Ref Range Status   Specimen Description BLOOD RIGHT ANTECUBITAL  Final   Special  Requests   Final    BOTTLES DRAWN AEROBIC AND ANAEROBIC Blood Culture adequate volume   Culture   Final    NO GROWTH 2 DAYS Performed at [facility], [place] [address] [place], [place] [address]    Report Status PENDING  Incomplete  SARS CORONAVIRUS 2 (TAT 6-24 HRS) Nasopharyngeal Nasopharyngeal Swab     Status: None   Collection Time: 08/27/19 11:53 PM   Specimen: Nasopharyngeal Swab  Result Value Ref Range Status   SARS Coronavirus 2 NEGATIVE NEGATIVE Final    Comment: (NOTE) SARS-CoV-2 target nucleic acids are NOT DETECTED. The SARS-CoV-2 RNA is generally detectable in upper and lower respiratory specimens during the acute phase of infection. Negative results do not preclude SARS-CoV-2 infection, do not rule out co-infections with other pathogens, and should not be used as the sole basis for treatment or other patient management decisions. Negative results must be combined with clinical observations, patient history, and epidemiological information. The expected result is Negative. Fact Sheet for Patients: SugarRoll.be Fact Sheet for Healthcare Providers: https://www.woods-mathews.com/ This test is not yet approved or cleared by the [place] FDA and  has been authorized for detection and/or diagnosis of SARS-CoV-2 by FDA under an Emergency Use Authorization (EUA). This EUA will remain  in effect (meaning this test can be used) for the duration of the COVID-19 declaration under Section 56 4(b)(1) of the Act, 21 U.S.C. section 360bbb-3(b)(1), unless the authorization is terminated or revoked sooner. Performed at [facility], [place] [address], [place], [place] [address]   Urine culture     Status: Abnormal   Collection Time: 08/28/19 12:28 AM   Specimen: In/Out Cath Urine  Result Value Ref Range Status   Specimen Description IN/OUT CATH URINE  Final   Special Requests   Final    NONE Performed at [facility], [place] [address] [place], [place] [address]    Culture MULTIPLE SPECIES PRESENT, SUGGEST RECOLLECTION (A)  Final   Report Status 08/29/2019 FINAL  Final    Procedures and diagnostic studies:  DG Chest 2 View  Result Date: 08/27/2019 CLINICAL DATA:  Chest pain with deep inspiration EXAM: CHEST - 2 VIEW COMPARISON:  06/08/2019 FINDINGS: Cardiac shadow is mildly enlarged. Postsurgical changes are again seen and stable. Previously seen dialysis catheter has been removed in the interval. The lungs are well aerated bilaterally with  the exception of the left lung base which demonstrates some chronic blunting of the costophrenic angle and atelectatic changes similar to that seen on prior exam. No bony abnormality is noted. IMPRESSION: Small left effusion and atelectatic changes similar to that seen on prior exam. Electronically Signed   By: Inez Catalina M.D.   On: 08/27/2019 20:08   ECHOCARDIOGRAM LIMITED  Result Date: 08/28/2019    ECHOCARDIOGRAM LIMITED REPORT   Patient Name:   Monique Henderson Date of Exam: 08/28/2019 Medical Rec #:  [id number]          Height:       63.0 in Accession #:    [id number]         Weight:       100.0 lb Date of Birth:  [birth year]           BSA:          1.440 m Patient Age:    75 years           BP:           161/75 mmHg Patient Gender: F                  HR:           75 bpm. Exam Location:  [facility] Procedure: Intracardiac Opacification Agent, Limited Echo, Color Doppler and            Cardiac Doppler Indications:    Fever 780.6 / R50.9  History:        Patient has no prior history of Echocardiogram examinations.                 Stroke, Arrythmias:SVT; Risk Factors:Tobacco disorder and                 Hypertension. CKD                 Elevated troponin.  Sonographer:    Vikki Ports Turrentine Referring Phys: Echo  1. Left ventricular ejection fraction, by estimation, is 50 to 55%. The left ventricle has low normal function. The left ventricle has no  regional wall motion abnormalities. There is moderate left ventricular hypertrophy. Left ventricular diastolic parameters are consistent with Grade II diastolic dysfunction (pseudonormalization).  2. Right ventricular systolic function is normal. The right ventricular size is normal. There is moderately elevated pulmonary artery systolic pressure.  3. Moderate left atrial dilatation. Mild right atrial dilatation.  4. Mild aortic sclerosis.  5. Mild-mod mitral regurgitation. Moderate tricuspid regurgitation. Estimated PASP 47 mmHg.  6. Trivial pericardial effusion. Large left pleural effusion.  7. Compared to previous study on 05/20/2020, pleural effusion is new. FINDINGS  Left Ventricle: Left ventricular ejection fraction, by estimation, is 50 to 55%. The left ventricle has low normal function. The left ventricle has no regional wall motion abnormalities. Definity contrast agent was given IV to delineate the left ventricular endocardial borders. There is moderate left ventricular hypertrophy. Right Ventricle: The right ventricular size is normal. Right ventricular systolic function is normal. There is moderately elevated pulmonary artery systolic pressure. The tricuspid regurgitant velocity is 3.32 m/s, and with an assumed right atrial pressure of 8 mmHg, the estimated right ventricular systolic pressure is 70.0 mmHg. Left Atrium: Left atrial size was moderately dilated. Right Atrium: Right atrial size was mildly dilated. Pericardium: Trivial pericardial effusion is present. Mitral Valve: The mitral valve is grossly normal. Mild to moderate mitral valve regurgitation. Tricuspid Valve: Tricuspid valve  regurgitation is moderate. Aortic Valve: The aortic valve is tricuspid. Aortic valve regurgitation is not visualized. Mild aortic valve sclerosis is present, with no evidence of aortic valve stenosis. Aorta: The aortic root is normal in size and structure. Venous: The inferior vena cava is normal in size with greater  than 50% respiratory variability, suggesting right atrial pressure of 3 mmHg. Additional Comments: There is a large pleural effusion in the left lateral region.  LEFT VENTRICLE PLAX 2D LVIDd:         3.40 cm LVIDs:         2.50 cm LV PW:         1.20 cm LV IVS:        1.10 cm  LEFT ATRIUM         Index LA diam:    3.00 cm 2.08 cm/m   AORTA Ao Root diam: 2.70 cm MITRAL VALVE                TRICUSPID VALVE MV Area (PHT): 4.63 cm     TR Peak grad:   44.1 mmHg MV Decel Time: 164 msec     TR Vmax:        332.00 cm/s MV E velocity: 159.00 cm/s MV A velocity: 64.90 cm/s MV E/A ratio:  2.45 Manish Patwardhan MD Electronically signed by Vernell Leep MD Signature Date/Time: 08/28/2019/9:41:04 PM    Final     Medications:   . aspirin EC  81 mg Oral Daily  . metoprolol tartrate  50 mg Oral BID  . rosuvastatin  10 mg Oral q1800  . sodium chloride flush  3 mL Intravenous Q12H  . warfarin  7.5 mg Oral ONCE-1600  . Warfarin - Pharmacist Dosing Inpatient   Does not apply q1800   Continuous Infusions: . azithromycin Stopped (08/29/19 0540)  . cefTRIAXone (ROCEPHIN)  IV Stopped (08/29/19 0432)  . heparin 1,150 Units/hr (08/29/19 0544)     LOS: 0 days   Radene Gunning NP [facility]   How to contact the [facility] Attending or Consulting provider [place] or covering provider during after hours [place], for this patient?  1. Check the care team in [facility] and look for a) attending/consulting [facility] provider listed and b) the [facility] team listed 2. Log into [email or web address] and use [place]'s universal password to access. If you do not have the password, please contact the hospital operator. 3. Locate the [facility] provider you are looking for under [facility] and page to a number that you can be directly reached. 4. If you still have difficulty reaching the provider, please page the [facility] (Director on Call) for the Hospitalists listed on amion for assistance.  08/29/2019, 2:01 PM

## 2019-08-30 DIAGNOSIS — R0789 Other chest pain: Secondary | ICD-10-CM

## 2019-08-30 DIAGNOSIS — I480 Paroxysmal atrial fibrillation: Secondary | ICD-10-CM

## 2019-08-30 DIAGNOSIS — I471 Supraventricular tachycardia: Secondary | ICD-10-CM | POA: Diagnosis not present

## 2019-08-30 DIAGNOSIS — J449 Chronic obstructive pulmonary disease, unspecified: Secondary | ICD-10-CM

## 2019-08-30 DIAGNOSIS — N184 Chronic kidney disease, stage 4 (severe): Secondary | ICD-10-CM | POA: Diagnosis not present

## 2019-08-30 DIAGNOSIS — R778 Other specified abnormalities of plasma proteins: Secondary | ICD-10-CM

## 2019-08-30 DIAGNOSIS — J90 Pleural effusion, not elsewhere classified: Secondary | ICD-10-CM

## 2019-08-30 DIAGNOSIS — Z8673 Personal history of transient ischemic attack (TIA), and cerebral infarction without residual deficits: Secondary | ICD-10-CM

## 2019-08-30 DIAGNOSIS — R6510 Systemic inflammatory response syndrome (SIRS) of non-infectious origin without acute organ dysfunction: Secondary | ICD-10-CM

## 2019-08-30 DIAGNOSIS — E875 Hyperkalemia: Secondary | ICD-10-CM

## 2019-08-30 LAB — PROTIME-INR
INR: 1.1 (ref 0.8–1.2)
Prothrombin Time: 13.9 seconds (ref 11.4–15.2)

## 2019-08-30 LAB — CBC
HCT: 35.5 % — ABNORMAL LOW (ref 36.0–46.0)
Hemoglobin: 10.5 g/dL — ABNORMAL LOW (ref 12.0–15.0)
MCH: 24.5 pg — ABNORMAL LOW (ref 26.0–34.0)
MCHC: 29.6 g/dL — ABNORMAL LOW (ref 30.0–36.0)
MCV: 82.8 fL (ref 80.0–100.0)
Platelets: 280 10*3/uL (ref 150–400)
RBC: 4.29 MIL/uL (ref 3.87–5.11)
RDW: 19.6 % — ABNORMAL HIGH (ref 11.5–15.5)
WBC: 6 10*3/uL (ref 4.0–10.5)
nRBC: 0 % (ref 0.0–0.2)

## 2019-08-30 LAB — HEPARIN LEVEL (UNFRACTIONATED): Heparin Unfractionated: 0.31 IU/mL (ref 0.30–0.70)

## 2019-08-30 LAB — BASIC METABOLIC PANEL
Anion gap: 12 (ref 5–15)
BUN: 28 mg/dL — ABNORMAL HIGH (ref 8–23)
CO2: 22 mmol/L (ref 22–32)
Calcium: 8.9 mg/dL (ref 8.9–10.3)
Chloride: 101 mmol/L (ref 98–111)
Creatinine, Ser: 2.52 mg/dL — ABNORMAL HIGH (ref 0.44–1.00)
GFR calc Af Amer: 22 mL/min — ABNORMAL LOW (ref >60)
GFR calc non Af Amer: 19 mL/min — ABNORMAL LOW (ref >60)
Glucose, Bld: 102 mg/dL — ABNORMAL HIGH (ref 70–99)
Potassium: 3.5 mmol/L (ref 3.5–5.1)
Sodium: 135 mmol/L (ref 135–145)

## 2019-08-30 MED ORDER — APIXABAN 2.5 MG PO TABS: 2.5000 mg | ORAL_TABLET | Freq: Two times a day (BID) | ORAL | 2 refills | Status: DC

## 2019-08-30 MED ORDER — HYDRALAZINE HCL 10 MG PO TABS: 10.0000 mg | ORAL_TABLET | Freq: Four times a day (QID) | ORAL | 11 refills | Status: DC

## 2019-08-30 MED ORDER — APIXABAN 2.5 MG PO TABS
2.5000 mg | ORAL_TABLET | Freq: Two times a day (BID) | ORAL | Status: DC
  Administered 2019-08-30: 2.5 mg via ORAL
  Filled 2019-08-30: qty 1

## 2019-08-30 MED ORDER — AZITHROMYCIN 250 MG PO TABS: 250.0000 mg | ORAL_TABLET | Freq: Every day | ORAL | 0 refills | Status: DC

## 2019-08-30 MED ORDER — HYDRALAZINE HCL 25 MG PO TABS: 25.0000 mg | ORAL_TABLET | Freq: Four times a day (QID) | ORAL | 11 refills | Status: DC

## 2019-08-30 MED FILL — ELIQUIS 2.5 MG TABLET: 2.5 | 30 days supply | Qty: 60 | Fill #0

## 2019-08-30 NOTE — Discharge Summary (Signed)
Physician Discharge Summary  [name] YKD:[id number] DOB: 1950/06/21 DOA: 08/27/2019  PCP: [name], MD  Admit date: 08/27/2019 Discharge date: 08/30/2019  Admitted From: Home  Discharge disposition: Home   Recommendations for Outpatient Follow-Up:   . Follow up with your primary care provider in one week.  . Check CBC, BMP in the next visit . She has been switched from Coumadin to Eliquis for anticoagulation. . Patient will need to follow-up with her nephrologist Dr. Moshe Cipro as outpatient.   Discharge Diagnosis:   Principal Problem:   Paroxysmal SVT (supraventricular tachycardia) (HCC) Active Problems:   CKD (chronic kidney disease), stage IV (HCC)   Chronic obstructive lung disease (HCC)   Pleural effusion   H/O: stroke   AF (paroxysmal atrial fibrillation) (HCC)   Atypical chest pain   SIRS (systemic inflammatory response syndrome) (HCC)   Elevated troponin   Positive D dimer   Hyperkalemia   SVT (supraventricular tachycardia) ([place])   Discharge Condition: Improved.  Diet recommendation: Low sodium, heart healthy.    Wound care: None.  Code status: Full.   History of Present Illness:   Monique Henderson is an 69 y.o. female with a past medical history significant for chronic kidney disease previously on dialysis, paroxysmal atrial fibrillation on Coumadin, CVA, hypertension presented to the emergency department on March 29 th with chief complaint persistent cough with worsening chest pain.  Work-up in the emergency department revealed fever, tachypnea, elevated troponin and paroxysmal SVT.  She was given adenosine per EMS and her heart rate improved.  Patient was then  placed in observation.   Hospital Course:   Following conditions were addressed during hospitalization as listed below,  Paroxysmal SVT/paroxysmal atrial fibrillation.    Improved.  Will be continued on metoprolol.  Was on Coumadin at home and due to compliance issues and  blood work Eliquis was discussed.  Patient will be transition to Eliquis on discharge.  Elevated troponin/chest pain.chronic. Likely demand ischemia. History of low risk stress test in January.  Continue aspirin and statin. Troponin now trending down. Chest pain is reproducible  SIRS. Patient presented with fever but no leukocytosis.Lactic acid and procalcitonin within the limits of normal. Chest x-ray with no appreciable signs of infiltrate,COVID-19 negative,urinalysis with small leukocytes rare bacteria negative nitrite.  Blood cultures with no growth to dateAntibiotics were initiated on admission.  We will continue Zithromax on discharge for next 3 days.  Chest x-ray on this admission showed small left effusion similar to prior exam.  Chronic kidney disease stage IV. Creatinine was 3.16 on admission with a potassium of 5.4. She reports being taken off of hemodialysis 2 weeks ago per Dr. Moshe Cipro.  Will need to follow-up with nephrology as outpatient.  Borderlinehyperkalemia. Resolved.  Potassium prior to discharge was 3.5  History of stroke. Continue aspirin and statin.No neurological deficits  Disposition.  At this time, patient is stable for disposition home.  Patient was also seen by physical therapy who recommended no skilled therapy needs on discharge.  Medical Consultants:    None.  Procedures:    None Subjective:   Today, patient feels okay.  No cough, chest pain, dyspnea.   Discharge Exam:   Vitals:   08/30/19 0500 08/30/19 1007  BP: (!) 185/106 (!) 180/91  Pulse: 84 85  Resp: (!) 27   Temp: 99.2 F (37.3 C)   SpO2: 99%    Vitals:   08/30/19 0000 08/30/19 0027 08/30/19 0500 08/30/19 1007  BP: (!) 182/92  Marland Kitchen)  185/106 (!) 180/91  Pulse: 77 79 84 85  Resp: (!) 26 16 (!) 27   Temp: 99.1 F (37.3 C)  99.2 F (37.3 C)   TempSrc: Oral  Oral   SpO2: 96% 98% 99%   Weight:      Height:       General: Alert awake, not in obvious  distress, Khalil HENT: pupils equally reacting to light,  No scleral pallor or icterus noted. Oral mucosa is moist.  Chest:   Diminished breath sounds bilaterally. No crackles or wheezes.  CVS: S1 &S2 heard. No murmur.  Regular rate and rhythm. Abdomen: Soft, nontender, nondistended.  Bowel sounds are heard.   Extremities: No cyanosis, clubbing but trace lower extremity edema peripheral pulses are palpable. Psych: Alert, awake and oriented, normal mood CNS:  No cranial nerve deficits.  Power equal in all extremities.   Skin: Warm and dry.  No rashes noted.  The results of significant diagnostics from this hospitalization (including imaging, microbiology, ancillary and laboratory) are listed below for reference.     Diagnostic Studies:   DG Chest 2 View  Result Date: 08/27/2019 CLINICAL DATA:  Chest pain with deep inspiration EXAM: CHEST - 2 VIEW COMPARISON:  06/08/2019 FINDINGS: Cardiac shadow is mildly enlarged. Postsurgical changes are again seen and stable. Previously seen dialysis catheter has been removed in the interval. The lungs are well aerated bilaterally with the exception of the left lung base which demonstrates some chronic blunting of the costophrenic angle and atelectatic changes similar to that seen on prior exam. No bony abnormality is noted. IMPRESSION: Small left effusion and atelectatic changes similar to that seen on prior exam. Electronically Signed   By: Inez Catalina M.D.   On: 08/27/2019 20:08   ECHOCARDIOGRAM LIMITED  Result Date: 08/28/2019    ECHOCARDIOGRAM LIMITED REPORT   Patient Name:   Monique Henderson Date of Exam: 08/28/2019 Medical Rec #:  [id number]          Height:       63.0 in Accession #:    [id number]         Weight:       100.0 lb Date of Birth:  July 11, 1950           BSA:          1.440 m Patient Age:    56 years           BP:           161/75 mmHg Patient Gender: F                  HR:           75 bpm. Exam Location:  [facility] Procedure: Intracardiac  Opacification Agent, Limited Echo, Color Doppler and            Cardiac Doppler Indications:    Fever 780.6 / R50.9  History:        Patient has no prior history of Echocardiogram examinations.                 Stroke, Arrythmias:SVT; Risk Factors:Tobacco disorder and                 Hypertension. CKD                 Elevated troponin.  Sonographer:    Vikki Ports Turrentine Referring Phys: [place]  1. Left ventricular ejection fraction, by estimation, is 50 to 55%. The  left ventricle has low normal function. The left ventricle has no regional wall motion abnormalities. There is moderate left ventricular hypertrophy. Left ventricular diastolic parameters are consistent with Grade II diastolic dysfunction (pseudonormalization).  2. Right ventricular systolic function is normal. The right ventricular size is normal. There is moderately elevated pulmonary artery systolic pressure.  3. Moderate left atrial dilatation. Mild right atrial dilatation.  4. Mild aortic sclerosis.  5. Mild-mod mitral regurgitation. Moderate tricuspid regurgitation. Estimated PASP 47 mmHg.  6. Trivial pericardial effusion. Large left pleural effusion.  7. Compared to previous study on 05/20/2020, pleural effusion is new. FINDINGS  Left Ventricle: Left ventricular ejection fraction, by estimation, is 50 to 55%. The left ventricle has low normal function. The left ventricle has no regional wall motion abnormalities. Definity contrast agent was given IV to delineate the left ventricular endocardial borders. There is moderate left ventricular hypertrophy. Right Ventricle: The right ventricular size is normal. Right ventricular systolic function is normal. There is moderately elevated pulmonary artery systolic pressure. The tricuspid regurgitant velocity is 3.32 m/s, and with an assumed right atrial pressure of 8 mmHg, the estimated right ventricular systolic pressure is 21.1 mmHg. Left Atrium: Left atrial size was moderately  dilated. Right Atrium: Right atrial size was mildly dilated. Pericardium: Trivial pericardial effusion is present. Mitral Valve: The mitral valve is grossly normal. Mild to moderate mitral valve regurgitation. Tricuspid Valve: Tricuspid valve regurgitation is moderate. Aortic Valve: The aortic valve is tricuspid. Aortic valve regurgitation is not visualized. Mild aortic valve sclerosis is present, with no evidence of aortic valve stenosis. Aorta: The aortic root is normal in size and structure. Venous: The inferior vena cava is normal in size with greater than 50% respiratory variability, suggesting right atrial pressure of 3 mmHg. Additional Comments: There is a large pleural effusion in the left lateral region.  LEFT VENTRICLE PLAX 2D LVIDd:         3.40 cm LVIDs:         2.50 cm LV PW:         1.20 cm LV IVS:        1.10 cm  LEFT ATRIUM         Index LA diam:    3.00 cm 2.08 cm/m   AORTA Ao Root diam: 2.70 cm MITRAL VALVE                TRICUSPID VALVE MV Area (PHT): 4.63 cm     TR Peak grad:   44.1 mmHg MV Decel Time: 164 msec     TR Vmax:        332.00 cm/s MV E velocity: 159.00 cm/s MV A velocity: 64.90 cm/s MV E/A ratio:  2.45 Manish Patwardhan MD Electronically signed by Vernell Leep MD Signature Date/Time: 08/28/2019/9:41:04 PM    Final      Labs:   Basic Metabolic Panel: Recent Labs  Lab 08/27/19 2050 08/27/19 2050 08/28/19 0316 08/28/19 0316 08/29/19 0356 08/30/19 0316  NA 138  --  140  --  140 135  K 5.4*   < > 3.7   < > 3.6 3.5  CL 102  --  103  --  104 101  CO2 25  --  27  --  24 22  GLUCOSE 126*  --  134*  --  99 102*  BUN 37*  --  33*  --  30* 28*  CREATININE 3.16*  --  2.92*  --  2.70* 2.52*  CALCIUM 8.9  --  9.0  --  8.9 8.9   < > = values in this interval not displayed.   GFR Estimated Creatinine Clearance: 16.6 mL/min (A) (by C-G formula based on SCr of 2.52 mg/dL (H)). Liver Function Tests: No results for input(s): AST, ALT, ALKPHOS, BILITOT, PROT, ALBUMIN in  the last 168 hours. No results for input(s): LIPASE, AMYLASE in the last 168 hours. No results for input(s): AMMONIA in the last 168 hours. Coagulation profile Recent Labs  Lab 08/27/19 2119 08/29/19 0356 08/30/19 0316  INR 1.1 1.1 1.1    CBC: Recent Labs  Lab 08/27/19 2050 08/28/19 0316 08/29/19 0356 08/30/19 0316  WBC 7.3 7.2 5.9 6.0  NEUTROABS 5.5 4.7  --   --   HGB 10.9* 11.0* 10.6* 10.5*  HCT 36.7 37.4 35.7* 35.5*  MCV 81.0 81.5 82.3 82.8  PLT 288 307 283 280   Cardiac Enzymes: No results for input(s): CKTOTAL, CKMB, CKMBINDEX, TROPONINI in the last 168 hours. BNP: Invalid input(s): POCBNP CBG: No results for input(s): GLUCAP in the last 168 hours. D-Dimer Recent Labs    08/27/19 2211  DDIMER 2.46*   Hgb A1c No results for input(s): HGBA1C in the last 72 hours. Lipid Profile No results for input(s): CHOL, HDL, LDLCALC, TRIG, CHOLHDL, LDLDIRECT in the last 72 hours. Thyroid function studies Recent Labs    08/27/19 2211  TSH 1.396   Anemia work up No results for input(s): VITAMINB12, FOLATE, FERRITIN, TIBC, IRON, RETICCTPCT in the last 72 hours. Microbiology Recent Results (from the past 240 hour(s))  Blood culture (routine x 2)     Status: None (Preliminary result)   Collection Time: 08/27/19 10:04 PM   Specimen: BLOOD RIGHT HAND  Result Value Ref Range Status   Specimen Description BLOOD RIGHT HAND  Final   Special Requests   Final    BOTTLES DRAWN AEROBIC AND ANAEROBIC Blood Culture results may not be optimal due to an inadequate volume of blood received in culture bottles   Culture   Final    NO GROWTH 2 DAYS Performed at [facility], [place] [address] [place], [place] [address]    Report Status PENDING  Incomplete  Blood culture (routine x 2)     Status: None (Preliminary result)   Collection Time: 08/27/19 10:41 PM   Specimen: BLOOD  Result Value Ref Range Status   Specimen Description BLOOD RIGHT ANTECUBITAL  Final   Special Requests    Final    BOTTLES DRAWN AEROBIC AND ANAEROBIC Blood Culture adequate volume   Culture   Final    NO GROWTH 2 DAYS Performed at [facility], [place] [address], [place], [place] [address]    Report Status PENDING  Incomplete  SARS CORONAVIRUS 2 (TAT 6-24 HRS) Nasopharyngeal Nasopharyngeal Swab     Status: None   Collection Time: 08/27/19 11:53 PM   Specimen: Nasopharyngeal Swab  Result Value Ref Range Status   SARS Coronavirus 2 NEGATIVE NEGATIVE Final    Comment: (NOTE) SARS-CoV-2 target nucleic acids are NOT DETECTED. The SARS-CoV-2 RNA is generally detectable in upper and lower respiratory specimens during the acute phase of infection. Negative results do not preclude SARS-CoV-2 infection, do not rule out co-infections with other pathogens, and should not be used as the sole basis for treatment or other patient management decisions. Negative results must be combined with clinical observations, patient history, and epidemiological information. The expected result is Negative. Fact Sheet for Patients: SugarRoll.be Fact Sheet for Healthcare Providers: https://www.woods-mathews.com/ This  test is not yet approved or cleared by the [place] and  has been authorized for detection and/or diagnosis of SARS-CoV-2 by FDA under an Emergency Use Authorization (EUA). This EUA will remain  in effect (meaning this test can be used) for the duration of the COVID-19 declaration under Section 56 4(b)(1) of the Act, 21 U.S.C. section 360bbb-3(b)(1), unless the authorization is terminated or revoked sooner. Performed at [facility], [place] [address] [place], [place] [address]   Urine culture     Status: Abnormal   Collection Time: 08/28/19 12:28 AM   Specimen: In/Out Cath Urine  Result Value Ref Range Status   Specimen Description IN/OUT CATH URINE  Final   Special Requests   Final    NONE Performed at [facility], [place]  [address], [place], [place] [address]    Culture MULTIPLE SPECIES PRESENT, SUGGEST RECOLLECTION (A)  Final   Report Status 08/29/2019 FINAL  Final     Discharge Instructions:   Discharge Instructions    Diet - low sodium heart healthy   Complete by: As directed    Discharge instructions   Complete by: As directed    Follow up with your   Increase activity slowly   Complete by: As directed      Allergies as of 08/30/2019      Reactions   Shrimp [shellfish Allergy] Shortness Of Breath   Bactroban [mupirocin] Other (See Comments)   "Sores in nose"   Vicodin [hydrocodone-acetaminophen] Itching, Nausea And Vomiting   This is patient's home medication   Lisinopril Cough   Tylenol [acetaminophen] Itching      Medication List    STOP taking these medications   warfarin 2.5 MG tablet Commonly known as: Coumadin     TAKE these medications   albuterol 108 (90 Base) MCG/ACT inhaler Commonly known as: VENTOLIN HFA Inhale 2 puffs into the lungs every 6 (six) hours as needed for wheezing or shortness of breath.   amLODipine 10 MG tablet Commonly known as: NORVASC TAKE 1 TABLET (10 MG TOTAL) BY MOUTH DAILY.   apixaban 2.5 MG Tabs tablet Commonly known as: ELIQUIS Take 1 tablet (2.5 mg total) by mouth 2 (two) times daily.   aspirin EC 81 MG tablet Take 1 tablet (81 mg total) by mouth daily. Until INR > 2. Then stop and continue coumadin only.   azithromycin 250 MG tablet Commonly known as: Zithromax Take 1 tablet (250 mg total) by mouth daily.   cloNIDine 0.3 mg/24hr patch Commonly known as: CATAPRES - Dosed in mg/24 hr Place 1 patch (0.3 mg total) onto the skin once a week. What changed: when to take this   furosemide 40 MG tablet Commonly known as: LASIX Take 40 mg by mouth 2 (two) times daily.   hydrALAZINE 25 MG tablet Commonly known as: APRESOLINE Take 25 mg by mouth 3 (three) times daily.   losartan 100 MG tablet Commonly known as: COZAAR Take 100 mg by mouth at  bedtime.   metoprolol tartrate 50 MG tablet Commonly known as: LOPRESSOR Take 1 tablet (50 mg total) by mouth 2 (two) times daily.   mirtazapine 7.5 MG tablet Commonly known as: REMERON TAKE 1 TABLET (7.5 MG TOTAL) BY MOUTH AT BEDTIME.   ondansetron 4 MG tablet Commonly known as: ZOFRAN Take 1 tablet (4 mg total) by mouth every 8 (eight) hours as needed for nausea or vomiting.   oxyCODONE-acetaminophen 5-325 MG tablet Commonly known as: PERCOCET/ROXICET Take 1  tablet by mouth 3 (three) times daily as needed for severe pain (pain.).   rosuvastatin 10 MG tablet Commonly known as: CRESTOR Take 1 tablet (10 mg total) by mouth daily.         Time coordinating discharge: 39 minutes  Signed:  John Williamsen  [facility] 08/30/2019, 11:35 AM

## 2019-08-30 NOTE — Discharge Instructions (Signed)

## 2019-08-30 NOTE — Progress Notes (Signed)
Patient's BP elevated to 173/98 with a MAP of 119. Unable to get an accurate manual BP due to patient's arm size. Baltazar Najjar, Jerilynn Mages.D paged. Awaiting orders.  Elaina Hoops, RN

## 2019-09-01 LAB — CULTURE, BLOOD (ROUTINE X 2)
Culture: NO GROWTH
Culture: NO GROWTH
Special Requests: ADEQUATE

## 2019-09-06 ENCOUNTER — Inpatient Hospital Stay: Payer: Medicare Other | Admitting: Adult Health Nurse Practitioner

## 2019-09-08 ENCOUNTER — Encounter: Payer: Self-pay | Admitting: Adult Health Nurse Practitioner

## 2019-09-11 ENCOUNTER — Inpatient Hospital Stay: Payer: Medicare Other | Admitting: Family Medicine

## 2019-09-15 DIAGNOSIS — G894 Chronic pain syndrome: Secondary | ICD-10-CM | POA: Diagnosis not present

## 2019-09-15 DIAGNOSIS — M5136 Other intervertebral disc degeneration, lumbar region: Secondary | ICD-10-CM | POA: Diagnosis not present

## 2019-09-15 DIAGNOSIS — E559 Vitamin D deficiency, unspecified: Secondary | ICD-10-CM | POA: Diagnosis not present

## 2019-09-15 DIAGNOSIS — Z1159 Encounter for screening for other viral diseases: Secondary | ICD-10-CM | POA: Diagnosis not present

## 2019-09-15 DIAGNOSIS — Z79899 Other long term (current) drug therapy: Secondary | ICD-10-CM | POA: Diagnosis not present

## 2019-09-18 ENCOUNTER — Inpatient Hospital Stay: Payer: Medicare Other | Admitting: Family Medicine

## 2019-10-02 ENCOUNTER — Ambulatory Visit
Admission: RE | Admit: 2019-10-02 | Discharge: 2019-10-02 | Disposition: A | Discharge status: Home / Self Care | Payer: Medicare Other | Source: Ambulatory Visit | Attending: Family Medicine | Admitting: Family Medicine

## 2019-10-02 ENCOUNTER — Other Ambulatory Visit: Payer: Self-pay

## 2019-10-02 DIAGNOSIS — Z1231 Encounter for screening mammogram for malignant neoplasm of breast: Secondary | ICD-10-CM

## 2019-10-03 ENCOUNTER — Ambulatory Visit (INDEPENDENT_AMBULATORY_CARE_PROVIDER_SITE_OTHER): Payer: Medicare Other

## 2019-10-03 ENCOUNTER — Other Ambulatory Visit: Payer: Self-pay

## 2019-10-03 ENCOUNTER — Ambulatory Visit (INDEPENDENT_AMBULATORY_CARE_PROVIDER_SITE_OTHER): Payer: Medicare Other | Admitting: Family Medicine

## 2019-10-03 ENCOUNTER — Encounter: Payer: Self-pay | Admitting: Family Medicine

## 2019-10-03 VITALS — BP 170/70 | HR 66 | Temp 97.6°F | Ht 63.0 in | Wt 106.0 lb

## 2019-10-03 DIAGNOSIS — G8929 Other chronic pain: Secondary | ICD-10-CM

## 2019-10-03 DIAGNOSIS — I4891 Unspecified atrial fibrillation: Secondary | ICD-10-CM | POA: Insufficient documentation

## 2019-10-03 DIAGNOSIS — I10 Essential (primary) hypertension: Secondary | ICD-10-CM | POA: Diagnosis not present

## 2019-10-03 DIAGNOSIS — M25511 Pain in right shoulder: Secondary | ICD-10-CM | POA: Diagnosis not present

## 2019-10-03 DIAGNOSIS — N184 Chronic kidney disease, stage 4 (severe): Secondary | ICD-10-CM | POA: Diagnosis not present

## 2019-10-03 DIAGNOSIS — I480 Paroxysmal atrial fibrillation: Secondary | ICD-10-CM | POA: Diagnosis not present

## 2019-10-03 DIAGNOSIS — Z7901 Long term (current) use of anticoagulants: Secondary | ICD-10-CM | POA: Diagnosis not present

## 2019-10-03 DIAGNOSIS — Z8673 Personal history of transient ischemic attack (TIA), and cerebral infarction without residual deficits: Secondary | ICD-10-CM

## 2019-10-03 HISTORY — DX: Unspecified atrial fibrillation: I48.91

## 2019-10-03 MED ORDER — WARFARIN SODIUM 2.5 MG PO TABS: 2.5000 mg | ORAL_TABLET | Freq: Every day | ORAL | 3 refills | Status: DC

## 2019-10-03 NOTE — Patient Instructions (Addendum)
Increase lasix to 40mg  twice a day for edema and blood pressure Take second dose around 1pm  Starting warfarin but will also be referred to anticoagulation clinic who will continue to manage    Vitamin K Foods and Warfarin Warfarin is a blood thinner (anticoagulant). Anticoagulant medicines help prevent the formation of blood clots. These medicines work by decreasing the activity of vitamin K, which promotes normal blood clotting. When you take warfarin, problems can occur from suddenly increasing or decreasing the amount of vitamin K that you eat from one day to the next. Problems may include:  Blood clots.  Bleeding. What general guidelines do I need to follow? To avoid problems when taking warfarin:  Eat a balanced diet that includes: ? Fresh fruits and vegetables. ? Whole grains. ? Low-fat dairy products. ? Lean proteins, such as fish, eggs, and lean cuts of meat.  Keep your intake of vitamin K consistent from day to day. To do this: ? Avoid eating large amounts of vitamin K one day and low amounts of vitamin K the next day. ? If you take a multivitamin that contains vitamin K, be sure to take it every day. ? Know which foods contain vitamin K. Use the lists below to understand serving sizes and the amount of vitamin K in one serving.  Avoid major changes in your diet. If you are going to change your diet, talk with your health care provider before making changes.  Work with a Financial planner (dietitian) to develop a meal plan that works best for you.  High vitamin K foods Foods that are high in vitamin K contain more than 100 mcg (micrograms) per serving. These include:  Broccoli (cooked) -  cup has 110 mcg.  Brussels sprouts (cooked) -  cup has 109 mcg.  Greens, beet (cooked) -  cup has 350 mcg.  Greens, collard (cooked) -  cup has 418 mcg.  Greens, turnip (cooked) -  cup has 265 mcg.  Green onions or scallions -  cup has 105 mcg.  Kale (fresh or frozen)  -  cup has 531 mcg.  Parsley (raw) - 10 sprigs has 164 mcg.  Spinach (cooked) -  cup has 444 mcg.  Swiss chard (cooked) -  cup has 287 mcg. Moderate vitamin K foods Foods that have a moderate amount of vitamin K contain 25-100 mcg per serving. These include:  Asparagus (cooked) - 5 spears have 38 mcg.  Black-eyed peas (dried) -  cup has 32 mcg.  Cabbage (cooked) -  cup has 37 mcg.  Kiwi fruit - 1 medium has 31 mcg.  Lettuce - 1 cup has 57-63 mcg.  Okra (frozen) -  cup has 44 mcg.  Prunes (dried) - 5 prunes have 25 mcg.  Watercress (raw) - 1 cup has 85 mcg. Low vitamin K foods Foods low in vitamin K contain less than 25 mcg per serving. These include:  Artichoke - 1 medium has 18 mcg.  Avocado - 1 oz. has 6 mcg.  Blueberries -  cup has 14 mcg.  Cabbage (raw) -  cup has 21 mcg.  Carrots (cooked) -  cup has 11 mcg.  Cauliflower (raw) -  cup has 11 mcg.  Cucumber with peel (raw) -  cup has 9 mcg.  Grapes -  cup has 12 mcg.  Mango - 1 medium has 9 mcg.  Nuts - 1 oz. has 15 mcg.  Pear - 1 medium has 8 mcg.  Peas (cooked) -  cup has 19  mcg.  Angie Fava - 1 spear has 14 mcg.  Pumpkin seeds - 1 oz. has 13 mcg.  Sauerkraut (canned) -  cup has 16 mcg.  Soybeans (cooked) -  cup has 16 mcg.  Tomato (raw) - 1 medium has 10 mcg.  Tomato sauce -  cup has 17 mcg. Vitamin K-free foods If a food contain less than 5 mcg per serving, it is considered to have no vitamin K. These foods include:  Bread and cereal products.  Cheese.  Eggs.  Fish and shellfish.  Meat and poultry.  Milk and dairy products.  Sunflower seeds. Actual amounts of vitamin K in foods may be different depending on processing. Talk with your dietitian about what foods you can eat and what foods you should avoid. This information is not intended to replace advice given to you by your health care provider. Make sure you discuss any questions you have with your health care  provider. Document Revised: 04/30/2017 Document Reviewed: 08/21/2015 Elsevier Patient Education  [employer].   If you have lab work done today you will be contacted with your lab results within the next 2 weeks.  If you have not heard from [place] then please contact us. The fastest way to get your results is to register for My Chart.   IF you received an x-ray today, you will receive an invoice from [facility]. Please contact [facility] at [phone] with questions or concerns regarding your invoice.   IF you received labwork today, you will receive an invoice from [place]. Please contact [facility] at [phone] with questions or concerns regarding your invoice.   Our billing staff will not be able to assist you with questions regarding bills from these companies.  You will be contacted with the lab results as soon as they are available. The fastest way to get your results is to activate your My Chart account. Instructions are located on the last page of this paperwork. If you have not heard from [place] regarding the results in 2 weeks, please contact this office.

## 2019-10-03 NOTE — Progress Notes (Signed)
5/4/20214:34 PM  Monique Henderson Nov 23, 1950, 69 y.o., female [id number]  Chief Complaint  Patient presents with  . Hypertension  . Foot Swelling    x 4 weeks since cath removal from R side chest   . R arm pain    HPI:   Patient is a 69 y.o. female with past medical history significant for HTN, afib, CVA dec 2020, Aortic dissection, cardiac arrest, ESRD on HD, GERD, chronic low back pain s/p lumbar fusion, bilateral breast cancer s/p radiation, HCV s/p treatment/curewho presents today for hosp followup  Last OV feb 2021  Admitted from march 28-31 for pSVT tx adenosine by EMS Changed from coumadin to eliquis for anticoagulation  Principal Problem:   Paroxysmal SVT (supraventricular tachycardia) (HCC) - cont on metoprolol Active Problems:   CKD (chronic kidney disease), stage IV ([facility]) - per d/c summary patient no longer on dialysis   Chronic obstructive lung disease (HCC)   Pleural effusion   H/O: stroke   AF (paroxysmal atrial fibrillation) ([place]) - changed to eliquis due to compliance concerns   Atypical chest pain   SIRS (systemic inflammatory response syndrome) (HCC)   Elevated troponin - demand ischemia   Positive D dimer   Hyperkalemia - resolved   SVT (supraventricular tachycardia) ([place])  Had port removed from right side about 1-2 months ago Since then having right shoulder/upper arm pain Cant lift her arm anymore She has been taking APAP  eliquis caused a full body rash No problems breathing  Having edema, taking lasix only once a day  Sees renal again in 6 months   Lab Results  Component Value Date   CREATININE 2.52 (H) 08/30/2019   BUN 28 (H) 08/30/2019   NA 135 08/30/2019   K 3.5 08/30/2019   CL 101 08/30/2019   CO2 22 08/30/2019  GFR 22  Lab Results  Component Value Date   WBC 6.0 08/30/2019   HGB 10.5 (L) 08/30/2019   HCT 35.5 (L) 08/30/2019   MCV 82.8 08/30/2019   PLT 280 08/30/2019   Lab Results  Component Value Date   CHOL 124  05/22/2019   HDL 42 05/22/2019   LDLCALC 56 05/22/2019   TRIG 132 05/22/2019   CHOLHDL 3.0 05/22/2019    Depression screen PHQ 2/9 10/03/2019 07/31/2019 07/10/2019  Decreased Interest 0 0 0  Down, Depressed, Hopeless 0 0 0  PHQ - 2 Score 0 0 0  Some recent data might be hidden    Fall Risk  10/03/2019 07/31/2019 07/12/2019 07/10/2019 05/29/2019  Falls in the past year? 0 0 0 0 0  Number falls in past yr: 0 0 - 0 0  Injury with Fall? 0 0 - 0 0  Risk for fall due to : - - - - -  Risk for fall due to: Comment - - - - -  Follow up Falls evaluation completed Falls evaluation completed - - Falls evaluation completed     Allergies  Allergen Reactions  . Shrimp [Shellfish Allergy] Shortness Of Breath  . Bactroban [Mupirocin] Other (See Comments)    "Sores in nose"  . Vicodin [Hydrocodone-Acetaminophen] Itching and Nausea And Vomiting    This is patient's home medication  . Lisinopril Cough  . Tylenol [Acetaminophen] Itching    Prior to Admission medications   Medication Sig Start Date End Date Taking? Authorizing Provider  albuterol (VENTOLIN HFA) 108 (90 Base) MCG/ACT inhaler Inhale 2 puffs into the lungs every 6 (six) hours as needed for wheezing or shortness  of breath. 02/02/19  Yes [name], MD  amLODipine (NORVASC) 10 MG tablet TAKE 1 TABLET (10 MG TOTAL) BY MOUTH DAILY. 09/20/18  Yes [name], MD  aspirin EC 81 MG tablet Take 1 tablet (81 mg total) by mouth daily. Until INR > 2. Then stop and continue coumadin only. 05/23/19  Yes Dessa Phi, DO  cloNIDine (CATAPRES - DOSED IN MG/24 HR) 0.3 mg/24hr patch Place 1 patch (0.3 mg total) onto the skin once a week. Patient taking differently: Place 0.3 mg onto the skin every Saturday.  04/22/19  Yes Lars Pinks M, PA-C  furosemide (LASIX) 40 MG tablet Take 40 mg by mouth 2 (two) times daily. 08/17/19  Yes [provider]  hydrALAZINE (APRESOLINE) 25 MG tablet Take 25 mg by mouth 3 (three) times daily.   Yes  [provider]  metoprolol tartrate (LOPRESSOR) 50 MG tablet Take 1 tablet (50 mg total) by mouth 2 (two) times daily. 08/03/19  Yes [name], MD  mirtazapine (REMERON) 7.5 MG tablet TAKE 1 TABLET (7.5 MG TOTAL) BY MOUTH AT BEDTIME. 08/04/19  Yes [name], MD  ondansetron (ZOFRAN) 4 MG tablet Take 1 tablet (4 mg total) by mouth every 8 (eight) hours as needed for nausea or vomiting. 08/03/19  Yes [name], MD  oxyCODONE-acetaminophen (PERCOCET/ROXICET) 5-325 MG tablet Take 1 tablet by mouth 3 (three) times daily as needed for severe pain (pain.).   Yes [provider]  rosuvastatin (CRESTOR) 10 MG tablet Take 1 tablet (10 mg total) by mouth daily. 06/14/19 10/03/19 Yes Patwardhan, Manish J, MD  apixaban (ELIQUIS) 2.5 MG TABS tablet Take 1 tablet (2.5 mg total) by mouth 2 (two) times daily. Patient not taking: Reported on 10/03/2019 08/30/19   Flora Lipps, MD  losartan (COZAAR) 100 MG tablet Take 100 mg by mouth at bedtime. 08/02/19   [provider]    Past Medical History:  Diagnosis Date  . Atelectasis 2002   Bilateral  . Bone spur 2008   Right calcaneal foot spur  . Breast cancer ([facility]) 2004   Ductal carcinoma in situ of the left breast; S/P left partial mastectomy 02/26/2003; S/P re-excision of cranial and lateral margins11/18/2004.radiation  . Breast cancer ([place] [facility]) 09/21/2012   right breast/ last radiation treatment 03/22/2013  . Chronic kidney disease, stage IV (severe) ([facility]) 10/10/2007  . 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 Autumn Messing; S/P re-excision of superior and inferior margins 10/27/2012.   Marland Kitchen GERD (gastroesophageal reflux disease)   . 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 Hot flashes   . Hx of radiation therapy 2005   left breast  . Hx of radiation therapy 01/11/13- 03/22/13   right breast 5760 cGy 30 sessions  . Hypertension   . Low back pain     . Lumbar spinal stenosis    S/P lumbar decompressive laminectomy, fusion and plating for lumbar spinal stensosis  . Normocytic anemia    With thrombocytosis  . Osteoarthritis   . Personal history of radiation therapy   . Right ureteral stone 2002  . Shortness of breath    from pain  . Uterine fibroid   . Wears dentures    top    Past Surgical History:  Procedure Laterality Date  . ANTERIOR LAT LUMBAR FUSION N/A 01/18/2014   Procedure: ANTERIOR LATERAL LUMBAR FUSION LUMBAR TWO-THREE;  Surgeon: Camelia Eng  Ronnald Ramp, MD;  Location: [facility];  Service: Neurosurgery;  Laterality: N/A;  . ANTERIOR LUMBAR FUSION  01/18/2014  . AV FISTULA PLACEMENT Left 04/20/2019   Procedure: ARTERIOVENOUS (AV) FISTULA CREATION;  Surgeon: [name], MD;  Location: [facility];  Service: Vascular;  Laterality: Left;  . BACK SURGERY    . BREAST LUMPECTOMY Left 01/2003  . BREAST LUMPECTOMY Right 2014  . BREAST LUMPECTOMY WITH NEEDLE LOCALIZATION AND AXILLARY SENTINEL LYMPH NODE BX Right 10/13/2012   Procedure: BREAST LUMPECTOMY WITH NEEDLE LOCALIZATION;  Surgeon: [name], MD;  Location: [facility];  Service: General;  Laterality: Right;  Right breast wire localized lumpectomy  . INSERTION OF DIALYSIS CATHETER Right 04/20/2019   Procedure: INSERTION OF DIALYSIS CATHETER, right internal jugular;  Surgeon: [name], MD;  Location: [facility];  Service: Vascular;  Laterality: Right;  . IR THORACENTESIS ASP PLEURAL SPACE W/IMG GUIDE  05/19/2019  . LAMINECTOMY  05/27/2009   Lumbar decompressive laminectomy, fusion and plating for lumbar spinal stensosis  . LUMBAR LAMINECTOMY/DECOMPRESSION MICRODISCECTOMY Left 03/23/2013   Procedure: LUMBAR LAMINECTOMY/DECOMPRESSION MICRODISCECTOMY 1 LEVEL;  Surgeon: Eustace Moore, MD;  Location: [facility];  Service: Neurosurgery;  Laterality: Left;  LUMBAR LAMINECTOMY/DECOMPRESSION MICRODISCECTOMY 1 LEVEL  . MASTECTOMY, PARTIAL Left  02/26/2003   ; S/P re-excision of cranial and lateral margins 04/19/2003.   Marland Kitchen RE-EXCISION OF BREAST CANCER,SUPERIOR MARGINS Right 10/27/2012   Procedure: RE-EXCISION OF BREAST CANCER,SUPERIOR and inferior MARGINS;  Surgeon: [name], MD;  Location: [facility];  Service: General;  Laterality: Right;  . RE-EXCISION OF BREAST LUMPECTOMY Left 04/2003  . TEE WITHOUT CARDIOVERSION N/A 04/04/2019   Procedure: Transesophageal Echocardiogram (Tee);  Surgeon: Wonda Olds, MD;  Location: [facility];  Service: Open Heart Surgery;  Laterality: N/A;  . THORACIC AORTIC ANEURYSM REPAIR N/A 04/04/2019   Procedure: THORACIC ASCENDING ANEURYSM REPAIR (AAA)  USING 28 MM X 30 CM HEMASHIELD PLATINUM VASCULAR GRAFT;  Surgeon: Wonda Olds, MD;  Location: [facility];  Service: Open Heart Surgery;  Laterality: N/A;    Social History   Tobacco Use  . Smoking status: Former Smoker    Packs/day: 0.25    Years: 44.00    Pack years: 11.00    Types: Cigarettes    Quit date: 05/24/2019    Years since quitting: 0.3  . Smokeless tobacco: Never Used  . Tobacco comment: smoking less  Substance Use Topics  . Alcohol use: Yes    Alcohol/week: 2.0 standard drinks    Types: 2 Cans of beer per week    Comment:  "couple beers q weekend"    Family History  Problem Relation Age of Onset  . Colon cancer Mother 65  . Hypertension Mother   . Diabetes Sister 46  . Hypertension Sister   . Diabetes Brother   . Hypertension Brother   . Diabetes Brother   . Hypertension Brother   . Kidney disease Son        On dialysis  . Hypertension Son   . Diabetes Son   . Multiple sclerosis Son   . Bone cancer Sister 79  . Breast cancer Neg Hx   . Cervical cancer Neg Hx     Review of Systems  Constitutional: Negative for chills, fever, malaise/fatigue and weight loss.  Eyes: Negative for blurred vision.  Respiratory: Negative for cough and shortness of breath.   Cardiovascular: Positive for leg swelling. Negative for chest pain  and palpitations.  Gastrointestinal:  Negative for abdominal pain, nausea and vomiting.  Musculoskeletal: Positive for joint pain.  Neurological: Negative for dizziness, sensory change, speech change, focal weakness and headaches.     OBJECTIVE:  Today's Vitals   10/03/19 1616  BP: (!) 164/78  Pulse: 66  Temp: 97.6 F (36.4 C)  SpO2: 99%  Weight: 106 lb (48.1 kg)  Height: 5\' 3"  (1.6 m)   Body mass index is 18.78 kg/m.   Wt Readings from Last 3 Encounters:  10/03/19 106 lb (48.1 kg)  08/29/19 105 lb 8 oz (47.9 kg)  07/12/19 108 lb (49 kg)     Physical Exam Vitals and nursing note reviewed.  Constitutional:      Appearance: She is well-developed.  HENT:     Head: Normocephalic and atraumatic.     Mouth/Throat:     Pharynx: No oropharyngeal exudate.  Eyes:     General: No scleral icterus.    Conjunctiva/sclera: Conjunctivae normal.     Pupils: Pupils are equal, round, and reactive to light.  Cardiovascular:     Rate and Rhythm: Normal rate and regular rhythm.     Heart sounds: Murmur present. No friction rub. No gallop.   Pulmonary:     Effort: Pulmonary effort is normal.     Breath sounds: Normal breath sounds. No wheezing or rales.  Musculoskeletal:     Right shoulder: Tenderness present. No swelling. Decreased range of motion. Normal pulse.     Cervical back: Neck supple.     Right lower leg: Edema present.     Left lower leg: Edema present.  Skin:    General: Skin is warm and dry.  Neurological:     Mental Status: She is alert and oriented to person, place, and time.     No results found for this or any previous visit (from the past 24 hour(s)).  No results found.   ASSESSMENT and PLAN  1. AF (paroxysmal atrial fibrillation) ([facility]) 2. H/O: stroke 3. On Coumadin for atrial fibrillation (HCC) Restarting coumadin. Discussed importance of frequent monitoring, risk of bleeding and diet recommendations, referring to coumadin clinic for long term mgt.  -  Ambulatory referral to Anticoagulation Monitoring - Protime-INR  4. CKD (chronic kidney disease), stage IV ([facility]) Off HD. Cont to see renal. Discussed increasing lasix to BID as rx given edema and high BP. Discussed low salt diet.  - Basic Metabolic Panel  5. Essential hypertension See #4  6. Chronic right shoulder pain Frozen shoulder? Cont with APAP and topical ointments, discussed ROM exercises.  - DG Shoulder Right - pending results - Ambulatory referral to Sports Medicine   Other orders - warfarin (COUMADIN) 2.5 MG tablet; Take 1 tablet (2.5 mg total) by mouth daily at 4 PM.  Return in about 1 week (around 10/10/2019) for BP/edema/coumadin.    [name], MD Primary Care at [facility] [place], [place] [address] Ph.  [phone] Fax [phone]

## 2019-10-04 ENCOUNTER — Other Ambulatory Visit: Payer: Self-pay | Admitting: Family Medicine

## 2019-10-04 LAB — BASIC METABOLIC PANEL
BUN/Creatinine Ratio: 11 — ABNORMAL LOW (ref 12–28)
BUN: 35 mg/dL — ABNORMAL HIGH (ref 8–27)
CO2: 27 mmol/L (ref 20–29)
Calcium: 10.1 mg/dL (ref 8.7–10.3)
Chloride: 101 mmol/L (ref 96–106)
Creatinine, Ser: 3.07 mg/dL (ref 0.57–1.00)
GFR calc Af Amer: 17 mL/min/{1.73_m2} — ABNORMAL LOW (ref >59)
GFR calc non Af Amer: 15 mL/min/{1.73_m2} — ABNORMAL LOW (ref >59)
Glucose: 93 mg/dL (ref 65–99)
Potassium: 3.9 mmol/L (ref 3.5–5.2)
Sodium: 141 mmol/L (ref 134–144)

## 2019-10-04 LAB — PROTIME-INR
INR: 1 (ref 0.9–1.2)
Prothrombin Time: 10.4 s (ref 9.1–12.0)

## 2019-10-05 ENCOUNTER — Telehealth: Payer: Self-pay

## 2019-10-05 NOTE — Telephone Encounter (Signed)
Patient with known CKD4/5, off HD, asymptomatic

## 2019-10-05 NOTE — Telephone Encounter (Signed)
Santiago Glad with [facility] calling to report critical  Creatinine 3.07. Spoke with Felicia in the practice and she will give result to a nurse.

## 2019-10-05 NOTE — Telephone Encounter (Signed)
I wrote it on a sticky note and gave it to [place] to give to provider.

## 2019-10-10 ENCOUNTER — Ambulatory Visit: Payer: Medicare Other | Admitting: Family Medicine

## 2019-10-11 ENCOUNTER — Encounter: Payer: Self-pay | Admitting: Family Medicine

## 2019-10-13 DIAGNOSIS — G894 Chronic pain syndrome: Secondary | ICD-10-CM | POA: Diagnosis not present

## 2019-10-13 DIAGNOSIS — M545 Low back pain: Secondary | ICD-10-CM | POA: Diagnosis not present

## 2019-10-13 DIAGNOSIS — Z79899 Other long term (current) drug therapy: Secondary | ICD-10-CM | POA: Diagnosis not present

## 2019-10-13 DIAGNOSIS — G8929 Other chronic pain: Secondary | ICD-10-CM | POA: Diagnosis not present

## 2019-11-02 ENCOUNTER — Ambulatory Visit (INDEPENDENT_AMBULATORY_CARE_PROVIDER_SITE_OTHER): Payer: Medicare Other | Admitting: Family Medicine

## 2019-11-02 ENCOUNTER — Other Ambulatory Visit: Payer: Self-pay

## 2019-11-02 VITALS — BP 170/90 | HR 76 | Temp 98.1°F | Ht 63.0 in | Wt 108.4 lb

## 2019-11-02 DIAGNOSIS — Z7901 Long term (current) use of anticoagulants: Secondary | ICD-10-CM | POA: Diagnosis not present

## 2019-11-02 DIAGNOSIS — I4891 Unspecified atrial fibrillation: Secondary | ICD-10-CM

## 2019-11-02 DIAGNOSIS — N184 Chronic kidney disease, stage 4 (severe): Secondary | ICD-10-CM | POA: Diagnosis not present

## 2019-11-02 DIAGNOSIS — I10 Essential (primary) hypertension: Secondary | ICD-10-CM | POA: Diagnosis not present

## 2019-11-02 NOTE — Patient Instructions (Addendum)
  Increase hydralazine to 25mg  three times a day  You have reported taking coumadin 2.5mg  twice a day, dose will be adjusted if needed per today's lab results. Plan is for coumadin clinic to manage your dosing in the future   If you have lab work done today you will be contacted with your lab results within the next 2 weeks.  If you have not heard from [place] then please contact us. The fastest way to get your results is to register for My Chart.   IF you received an x-ray today, you will receive an invoice from [facility]. Please contact [facility] at [phone] with questions or concerns regarding your invoice.   IF you received labwork today, you will receive an invoice from [facility]. Please contact [facility] at [phone] with questions or concerns regarding your invoice.   Our billing staff will not be able to assist you with questions regarding bills from these companies.  You will be contacted with the lab results as soon as they are available. The fastest way to get your results is to activate your My Chart account. Instructions are located on the last page of this paperwork. If you have not heard from [place] regarding the results in 2 weeks, please contact this office.

## 2019-11-02 NOTE — Progress Notes (Signed)
6/3/20213:38 PM  Monique Henderson April 21, 1951, 69 y.o., female [id number]  Chief Complaint  Patient presents with  . Follow-up    bp and coumadin level, no longer having the swelling    HPI:   Patient is a 69 y.o. female with past medical history significant for HTN,afib, CVA dec 2020, Aortic dissection, cardiac arrest, ESRD on HD, GERD, chronic low back pain s/p lumbar fusion on opiates, bilateral breast cancer s/p radiation, HCV s/p treatment/curewho presents today for routine followup  Last OV a month ago - restarted coumadin, referred to anticoag clinic, increased lasix to 40mg  BID Has appt wih cards/coumadin clinic on June 7th  She reports that she is doing well since last OV She reports her edema has resolved BP at home similar to today She is actually taking coumadin 2.5mg  BID She denies any abnormal bleeding Has been taking hydralazine BID instead of TID  Depression screen [facility] 2/9 11/02/2019 10/03/2019 07/31/2019  Decreased Interest 0 0 0  Down, Depressed, Hopeless 0 0 0  PHQ - 2 Score 0 0 0  Some recent data might be hidden    Fall Risk  11/02/2019 10/03/2019 07/31/2019 07/12/2019 07/10/2019  Falls in the past year? 0 0 0 0 0  Number falls in past yr: 0 0 0 - 0  Injury with Fall? 1 0 0 - 0  Risk for fall due to : - - - - -  Risk for fall due to: Comment - - - - -  Follow up - Falls evaluation completed Falls evaluation completed - -     Allergies  Allergen Reactions  . Shrimp [Shellfish Allergy] Shortness Of Breath  . Bactroban [Mupirocin] Other (See Comments)    "Sores in nose"  . Vicodin [Hydrocodone-Acetaminophen] Itching and Nausea And Vomiting    This is patient's home medication  . Eliquis [Apixaban] Rash  . Lisinopril Cough  . Tylenol [Acetaminophen] Itching    Prior to Admission medications   Medication Sig Start Date End Date Taking? Authorizing Provider  albuterol (VENTOLIN HFA) 108 (90 Base) MCG/ACT inhaler INHALE 2 PUFFS INTO THE LUNGS EVERY 6 (SIX)  HOURS AS NEEDED FOR WHEEZING OR SHORTNESS OF BREATH. 10/04/19  Yes [name], MD  amLODipine (NORVASC) 10 MG tablet TAKE 1 TABLET (10 MG TOTAL) BY MOUTH DAILY. 09/20/18  Yes [name], MD  aspirin EC 81 MG tablet Take 1 tablet (81 mg total) by mouth daily. Until INR > 2. Then stop and continue coumadin only. 05/23/19  Yes Dessa Phi, DO  cloNIDine (CATAPRES - DOSED IN MG/24 HR) 0.3 mg/24hr patch Place 1 patch (0.3 mg total) onto the skin once a week. Patient taking differently: Place 0.3 mg onto the skin every Saturday.  04/22/19  Yes Lars Pinks M, PA-C  furosemide (LASIX) 40 MG tablet Take 40 mg by mouth 2 (two) times daily. 08/17/19  Yes [provider]  hydrALAZINE (APRESOLINE) 25 MG tablet Take 25 mg by mouth 3 (three) times daily.   Yes [provider]  losartan (COZAAR) 100 MG tablet Take 100 mg by mouth at bedtime. 08/02/19  Yes [provider]  metoprolol tartrate (LOPRESSOR) 50 MG tablet Take 1 tablet (50 mg total) by mouth 2 (two) times daily. 08/03/19  Yes [name], MD  mirtazapine (REMERON) 7.5 MG tablet TAKE 1 TABLET (7.5 MG TOTAL) BY MOUTH AT BEDTIME. 08/04/19  Yes [name], MD  ondansetron (ZOFRAN) 4 MG tablet Take 1 tablet (4 mg total) by  mouth every 8 (eight) hours as needed for nausea or vomiting. 08/03/19  Yes [name], MD  oxyCODONE-acetaminophen (PERCOCET/ROXICET) 5-325 MG tablet Take 1 tablet by mouth 3 (three) times daily as needed for severe pain (pain.).   Yes [provider]  warfarin (COUMADIN) 2.5 MG tablet Take 1 tablet (2.5 mg total) by mouth daily at 4 PM. 10/03/19  Yes [name], MD  rosuvastatin (CRESTOR) 10 MG tablet Take 1 tablet (10 mg total) by mouth daily. 06/14/19 10/03/19  Nigel Mormon, MD    Past Medical History:  Diagnosis Date  . AF (paroxysmal atrial fibrillation) ([place]) 05/29/2019  . Aortic atherosclerosis ([place]) 07/05/2019  . Aortic dissection ([place]) 04/04/2019  .  Atelectasis 2002   Bilateral  . Atrial fibrillation with RVR ([place]) 05/18/2019  . Atypical chest pain 07/05/2019  . Bone spur 2008   Right calcaneal foot spur  . Breast cancer ([place]) 2004   Ductal carcinoma in situ of the left breast; S/P left partial mastectomy 02/26/2003; S/P re-excision of cranial and lateral margins11/18/2004.radiation  . Breast cancer ([place]) 09/21/2012   right breast/ last radiation treatment 03/22/2013  . Cerebral thrombosis with cerebral infarction 05/22/2019  . Chronic kidney disease, stage IV (severe) ([place]) 10/10/2007  . Chronic low back pain 06/22/2016  . Chronic obstructive lung disease ([place]) 01/16/2017  . CKD (chronic kidney disease), stage IV ([place]) 07/19/2017  . 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 Autumn Messing; S/P re-excision of superior and inferior margins 10/27/2012.   . Dissection of aorta ([place]) 04/03/2019  . Elevated troponin 08/28/2019  . ESRD on dialysis ([place]) 05/29/2019  . Essential hypertension 09/16/2006  . GERD 09/16/2006  . GERD (gastroesophageal reflux disease)   . H/O: stroke 05/29/2019  . 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 Hot flashes   . Hx of radiation therapy 2005   left breast  . Hx of radiation therapy 01/11/13- 03/22/13   right breast 5760 cGy 30 sessions  . Hyperkalemia 08/28/2019  . Hypertension   . Insomnia 03/14/2015  . Low back pain   . Lumbar spinal stenosis    S/P lumbar decompressive laminectomy, fusion and plating for lumbar spinal stensosis  . Malnutrition of moderate degree 05/19/2019  . Non compliance with medical treatment 12/04/2017  . Normocytic anemia    With thrombocytosis  . On Coumadin for atrial fibrillation ([place]) 10/03/2019  . Osteoarthritis   . Paroxysmal SVT (supraventricular tachycardia) ([place]) 08/27/2019  . Personal history of radiation therapy   . Pleural effusion 05/18/2019  . Positive D dimer 08/28/2019  . Right ureteral  stone 2002  . S/P aortic aneurysm repair 04/07/2019  . S/P lumbar spinal fusion 01/18/2014   S/P lumbar decompressive laminectomy, fusion, and plating for lumbar spinal stenosis on 05/27/2009 by Dr. Eustace Moore.  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 Eustace Moore.    . Shortness of breath    from pain  . SIRS (systemic inflammatory response syndrome) ([place]) 08/27/2019  . SVT (supraventricular tachycardia) ([place]) 08/28/2019  . Tobacco use disorder 04/19/2009  . Uterine fibroid   . Wears dentures    top    Past Surgical History:  Procedure Laterality Date  . ANTERIOR LAT LUMBAR FUSION N/A 01/18/2014  Procedure: ANTERIOR LATERAL LUMBAR FUSION LUMBAR TWO-THREE;  Surgeon: Eustace Moore, MD;  Location: [place] [facility];  Service: Neurosurgery;  Laterality: N/A;  . ANTERIOR LUMBAR FUSION  01/18/2014  . AV FISTULA PLACEMENT Left 04/20/2019   Procedure: ARTERIOVENOUS (AV) FISTULA CREATION;  Surgeon: [name], MD;  Location: [place];  Service: Vascular;  Laterality: Left;  . BACK SURGERY    . BREAST LUMPECTOMY Left 01/2003  . BREAST LUMPECTOMY Right 2014  . BREAST LUMPECTOMY WITH NEEDLE LOCALIZATION AND AXILLARY SENTINEL LYMPH NODE BX Right 10/13/2012   Procedure: BREAST LUMPECTOMY WITH NEEDLE LOCALIZATION;  Surgeon: [name], MD;  Location: [place];  Service: General;  Laterality: Right;  Right breast wire localized lumpectomy  . INSERTION OF DIALYSIS CATHETER Right 04/20/2019   Procedure: INSERTION OF DIALYSIS CATHETER, right internal jugular;  Surgeon: [name], MD;  Location: [place];  Service: Vascular;  Laterality: Right;  . IR THORACENTESIS ASP PLEURAL SPACE W/IMG GUIDE  05/19/2019  . LAMINECTOMY  05/27/2009   Lumbar decompressive laminectomy,  fusion and plating for lumbar spinal stensosis  . LUMBAR LAMINECTOMY/DECOMPRESSION MICRODISCECTOMY Left 03/23/2013   Procedure: LUMBAR LAMINECTOMY/DECOMPRESSION MICRODISCECTOMY 1 LEVEL;  Surgeon: Eustace Moore, MD;  Location: [place] [facility];  Service: Neurosurgery;  Laterality: Left;  LUMBAR LAMINECTOMY/DECOMPRESSION MICRODISCECTOMY 1 LEVEL  . MASTECTOMY, PARTIAL Left 02/26/2003   ; S/P re-excision of cranial and lateral margins 04/19/2003.   Marland Kitchen RE-EXCISION OF BREAST CANCER,SUPERIOR MARGINS Right 10/27/2012   Procedure: RE-EXCISION OF BREAST CANCER,SUPERIOR and inferior MARGINS;  Surgeon: [name], MD;  Location: [place];  Service: General;  Laterality: Right;  . RE-EXCISION OF BREAST LUMPECTOMY Left 04/2003  . TEE WITHOUT CARDIOVERSION N/A 04/04/2019   Procedure: Transesophageal Echocardiogram (Tee);  Surgeon: Wonda Olds, MD;  Location: [place];  Service: Open Heart Surgery;  Laterality: N/A;  . THORACIC AORTIC ANEURYSM REPAIR N/A 04/04/2019   Procedure: THORACIC ASCENDING ANEURYSM REPAIR (AAA)  USING 28 MM X 30 CM HEMASHIELD PLATINUM VASCULAR GRAFT;  Surgeon: Wonda Olds, MD;  Location: [facility];  Service: Open Heart Surgery;  Laterality: N/A;    Social History   Tobacco Use  . Smoking status: Former Smoker    Packs/day: 0.25    Years: 44.00    Pack years: 11.00    Types: Cigarettes    Quit date: 05/24/2019    Years since quitting: 0.4  . Smokeless tobacco: Never Used  . Tobacco comment: smoking less  Substance Use Topics  . Alcohol use: Yes    Alcohol/week: 2.0 standard drinks    Types: 2 Cans of beer per week    Comment:  "couple beers q weekend"    Family History  Problem Relation Age of Onset  . Colon cancer Mother 46  . Hypertension Mother   . Diabetes Sister 25  . Hypertension Sister   . Diabetes Brother   . Hypertension Brother   . Diabetes Brother   . Hypertension Brother   . Kidney disease Son        On dialysis  . Hypertension Son   . Diabetes Son   .  Multiple sclerosis Son   . Bone cancer Sister 41  . Breast cancer Neg Hx   . Cervical cancer Neg Hx     Review of Systems  Constitutional: Negative for chills and fever.  Respiratory: Negative for cough and shortness of breath.   Cardiovascular: Negative for chest pain, palpitations and leg swelling.  Gastrointestinal:  Negative for abdominal pain, blood in stool, melena, nausea and vomiting.  Genitourinary: Negative for hematuria.  Endo/Heme/Allergies: Does not bruise/bleed easily.     OBJECTIVE:  Today's Vitals   11/02/19 1534  BP: (!) 170/90  Pulse: 76  Temp: 98.1 F (36.7 C)  SpO2: 100%  Weight: 108 lb 6.4 oz (49.2 kg)  Height: 5\' 3"  (1.6 m)   Body mass index is 19.2 kg/m.   Physical Exam Vitals and nursing note reviewed.  Constitutional:      Appearance: She is well-developed.  HENT:     Head: Normocephalic and atraumatic.     Mouth/Throat:     Pharynx: No oropharyngeal exudate.  Eyes:     General: No scleral icterus.    Conjunctiva/sclera: Conjunctivae normal.     Pupils: Pupils are equal, round, and reactive to light.  Cardiovascular:     Rate and Rhythm: Normal rate and regular rhythm.     Heart sounds: Murmur present. No friction rub. No gallop.   Pulmonary:     Effort: Pulmonary effort is normal.     Breath sounds: Normal breath sounds. No wheezing, rhonchi or rales.  Musculoskeletal:     Cervical back: Neck supple.     Right lower leg: No edema.     Left lower leg: No edema.  Skin:    General: Skin is warm and dry.  Neurological:     Mental Status: She is alert and oriented to person, place, and time.     No results found for this or any previous visit (from the past 24 hour(s)).  No results found.   ASSESSMENT and PLAN  1. Essential hypertension Not controlled. Increase hydralazine to TID as prescribed. Has upcoming appt with cardiology.  2. On Coumadin for atrial fibrillation [facility]) Patient has been taking coumadin 2.5mg  BID, meds to  be adjusted per lab result. Has upcoming appt to establish with cards - [facility]-INR  3. CKD (chronic kidney disease), stage IV ([place]) - Basic Metabolic Panel  Return in about 4 weeks (around 11/30/2019).    [name], MD Primary Care at [facility], [place] [address] Ph.  [phone] Fax [phone]

## 2019-11-03 ENCOUNTER — Other Ambulatory Visit: Payer: Self-pay | Admitting: Family Medicine

## 2019-11-03 DIAGNOSIS — Z7901 Long term (current) use of anticoagulants: Secondary | ICD-10-CM

## 2019-11-03 DIAGNOSIS — N184 Chronic kidney disease, stage 4 (severe): Secondary | ICD-10-CM

## 2019-11-03 DIAGNOSIS — I4891 Unspecified atrial fibrillation: Secondary | ICD-10-CM

## 2019-11-03 LAB — BASIC METABOLIC PANEL
BUN/Creatinine Ratio: 12 (ref 12–28)
BUN: 44 mg/dL — ABNORMAL HIGH (ref 8–27)
CO2: 22 mmol/L (ref 20–29)
Calcium: 9.1 mg/dL (ref 8.7–10.3)
Chloride: 101 mmol/L (ref 96–106)
Creatinine, Ser: 3.63 mg/dL (ref 0.57–1.00)
GFR calc Af Amer: 14 mL/min/{1.73_m2} — ABNORMAL LOW (ref >59)
GFR calc non Af Amer: 12 mL/min/{1.73_m2} — ABNORMAL LOW (ref >59)
Glucose: 99 mg/dL (ref 65–99)
Potassium: 4 mmol/L (ref 3.5–5.2)
Sodium: 140 mmol/L (ref 134–144)

## 2019-11-03 LAB — PROTIME-INR
INR: 1.1 (ref 0.9–1.2)
Prothrombin Time: 11.4 s (ref 9.1–12.0)

## 2019-11-03 MED ORDER — FUROSEMIDE 20 MG PO TABS: 20.0000 mg | ORAL_TABLET | Freq: Two times a day (BID) | ORAL | 5 refills | Status: DC

## 2019-11-03 MED ORDER — WARFARIN SODIUM 6 MG PO TABS: 6.0000 mg | ORAL_TABLET | Freq: Every day | ORAL | 2 refills | Status: DC

## 2019-11-06 ENCOUNTER — Ambulatory Visit: Payer: Medicare Other | Admitting: Cardiology

## 2019-11-09 DIAGNOSIS — G894 Chronic pain syndrome: Secondary | ICD-10-CM | POA: Diagnosis not present

## 2019-11-09 DIAGNOSIS — Z79899 Other long term (current) drug therapy: Secondary | ICD-10-CM | POA: Diagnosis not present

## 2019-11-09 DIAGNOSIS — G8929 Other chronic pain: Secondary | ICD-10-CM | POA: Diagnosis not present

## 2019-11-09 DIAGNOSIS — M545 Low back pain: Secondary | ICD-10-CM | POA: Diagnosis not present

## 2019-11-10 ENCOUNTER — Ambulatory Visit: Payer: Self-pay

## 2019-11-13 ENCOUNTER — Telehealth: Payer: Self-pay

## 2019-11-13 ENCOUNTER — Other Ambulatory Visit: Payer: Self-pay | Admitting: Cardiology

## 2019-11-13 DIAGNOSIS — I700 Atherosclerosis of aorta: Secondary | ICD-10-CM

## 2019-11-13 DIAGNOSIS — Z8673 Personal history of transient ischemic attack (TIA), and cerebral infarction without residual deficits: Secondary | ICD-10-CM

## 2019-11-13 NOTE — Telephone Encounter (Signed)
Per surgery sheet, called HD facility to inquire about pt's need for fistulogram. They stated she no longer goes there. I then called pt who states she is no longer on HD and "feels great".

## 2019-11-28 NOTE — Progress Notes (Signed)
Cardiology Office Note:    Date:  11/29/2019   ID:  Monique Henderson, DOB 01-11-1951, MRN [id number]  PCP:  [name], MD  Cardiologist:  No primary care provider on file.   Referring MD: [name], MD   Chief Complaint  Patient presents with  . Follow-up    Arm pain    History of Present Illness:    Monique Henderson is a 69 y.o. 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  The patient states Dr. [name] referred her because of right arm pain that has been going on since her temporary dialysis catheter was removed from the right subclavicular space.  She has a primary cardiologist, Dr. Baldwin Crown.  The arm pain I do not believe has anything to do with her heart.  In the interval since yesterday she has developed pleuritic right chest pain.  This was not present when she saw Dr. [name] in May.  She did require dialysis after aortic dissection and transient hemodialysis.  Dialysis catheter has been removed.  She denies shortness of breath, orthopnea, palpitations, and syncope.  There is no exertional related chest discomfort.  Today's EKG is nonischemic.  She does have lateral T wave inversion which has been chronic on prior tracings.  Past Medical History:  Diagnosis Date  . AF (paroxysmal atrial fibrillation) ([place]) 05/29/2019  . Aortic atherosclerosis ([place]) 07/05/2019  . Aortic dissection ([place]) 04/04/2019  . Atelectasis 2002   Bilateral  . Atrial fibrillation with RVR ([place]) 05/18/2019  . Atypical chest pain 07/05/2019  . Bone spur 2008   Right calcaneal foot spur  . Breast cancer ([place]) 2004   Ductal carcinoma in situ of the left breast; S/P left partial mastectomy 02/26/2003; S/P re-excision of cranial and lateral margins11/18/2004.radiation  . Breast cancer ([place]) 09/21/2012   right breast/ last radiation treatment 03/22/2013  . Cerebral thrombosis  with cerebral infarction 05/22/2019  . Chronic kidney disease, stage IV (severe) ([place]) 10/10/2007  . Chronic low back pain 06/22/2016  . Chronic obstructive lung disease ([place]) 01/16/2017  . CKD (chronic kidney disease), stage IV ([place]) 07/19/2017  . 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 Autumn Messing; S/P re-excision of superior and inferior margins 10/27/2012.   . Dissection of aorta ([place]) 04/03/2019  . Elevated troponin 08/28/2019  . ESRD on dialysis ([place]) 05/29/2019  . Essential hypertension 09/16/2006  . GERD 09/16/2006  . GERD (gastroesophageal reflux disease)   . H/O: stroke 05/29/2019  . 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 Hot flashes   . Hx of radiation therapy 2005   left breast  . Hx of radiation therapy 01/11/13- 03/22/13   right breast 5760 cGy 30 sessions  . Hyperkalemia 08/28/2019  . Hypertension   . Insomnia 03/14/2015  . Low back pain   . Lumbar spinal stenosis    S/P lumbar decompressive laminectomy, fusion and plating for lumbar spinal stensosis  . Malnutrition of moderate degree 05/19/2019  . Non compliance with medical treatment 12/04/2017  . Normocytic anemia    With thrombocytosis  . On Coumadin for atrial fibrillation ([place]) 10/03/2019  . Osteoarthritis   . Paroxysmal SVT (supraventricular tachycardia) ([place]) 08/27/2019  . Personal history of radiation therapy   . Pleural effusion 05/18/2019  . Positive D dimer 08/28/2019  . Right ureteral stone  2002  . S/P aortic aneurysm repair 04/07/2019  . S/P lumbar spinal fusion 01/18/2014   S/P lumbar decompressive laminectomy, fusion, and plating for lumbar spinal stenosis on 05/27/2009 by Dr. Eustace Moore.  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 Eustace Moore.    . Shortness of breath    from pain  . SIRS (systemic inflammatory response syndrome) ([place]) 08/27/2019  . SVT (supraventricular tachycardia) ([place]) 08/28/2019  . Tobacco use disorder 04/19/2009  . Uterine fibroid   . Wears dentures    top    Past Surgical History:  Procedure Laterality Date  . ANTERIOR LAT LUMBAR FUSION N/A 01/18/2014   Procedure: ANTERIOR LATERAL LUMBAR FUSION LUMBAR TWO-THREE;  Surgeon: Eustace Moore, MD;  Location: [facility];  Service: Neurosurgery;  Laterality: N/A;  . ANTERIOR LUMBAR FUSION  01/18/2014  . AV FISTULA PLACEMENT Left 04/20/2019   Procedure: ARTERIOVENOUS (AV) FISTULA CREATION;  Surgeon: [name], MD;  Location: [facility];  Service: Vascular;  Laterality: Left;  . BACK SURGERY    . BREAST LUMPECTOMY Left 01/2003  . BREAST LUMPECTOMY Right 2014  . BREAST LUMPECTOMY WITH NEEDLE LOCALIZATION AND AXILLARY SENTINEL LYMPH NODE BX Right 10/13/2012   Procedure: BREAST LUMPECTOMY WITH NEEDLE LOCALIZATION;  Surgeon: [name], MD;  Location: [facility];  Service: General;  Laterality: Right;  Right breast wire localized lumpectomy  . INSERTION OF DIALYSIS CATHETER Right 04/20/2019   Procedure: INSERTION OF DIALYSIS CATHETER, right internal jugular;  Surgeon: [name], MD;  Location: [facility];  Service: Vascular;  Laterality: Right;  . IR THORACENTESIS ASP PLEURAL SPACE W/IMG GUIDE  05/19/2019  . LAMINECTOMY  05/27/2009   Lumbar decompressive laminectomy, fusion and plating for lumbar spinal stensosis  . LUMBAR LAMINECTOMY/DECOMPRESSION MICRODISCECTOMY Left 03/23/2013   Procedure: LUMBAR LAMINECTOMY/DECOMPRESSION MICRODISCECTOMY 1 LEVEL;  Surgeon: Eustace Moore, MD;  Location: [facility];  Service: Neurosurgery;  Laterality: Left;  LUMBAR LAMINECTOMY/DECOMPRESSION MICRODISCECTOMY 1 LEVEL  . MASTECTOMY, PARTIAL Left 02/26/2003   ; S/P re-excision of cranial and lateral margins 04/19/2003.   Marland Kitchen RE-EXCISION  OF BREAST CANCER,SUPERIOR MARGINS Right 10/27/2012   Procedure: RE-EXCISION OF BREAST CANCER,SUPERIOR and inferior MARGINS;  Surgeon: [name], MD;  Location: [facility];  Service: General;  Laterality: Right;  . RE-EXCISION OF BREAST LUMPECTOMY Left 04/2003  . TEE WITHOUT CARDIOVERSION N/A 04/04/2019   Procedure: Transesophageal Echocardiogram (Tee);  Surgeon: Wonda Olds, MD;  Location: [facility];  Service: Open Heart Surgery;  Laterality: N/A;  . THORACIC AORTIC ANEURYSM REPAIR N/A 04/04/2019   Procedure: THORACIC ASCENDING ANEURYSM REPAIR (AAA)  USING 28 MM X 30 CM HEMASHIELD PLATINUM VASCULAR GRAFT;  Surgeon: Wonda Olds, MD;  Location[facility];  Service: Open Heart Surgery;  Laterality: N/A;    Current Medications: Current Meds  Medication Sig  . albuterol (VENTOLIN HFA) 108 (90 Base) MCG/ACT inhaler INHALE 2 PUFFS INTO THE LUNGS EVERY 6 (SIX) HOURS AS NEEDED FOR WHEEZING OR SHORTNESS OF BREATH.  Marland Kitchen amLODipine (NORVASC) 10 MG tablet TAKE 1 TABLET (10 MG TOTAL) BY MOUTH DAILY.  Marland Kitchen aspirin EC 81 MG tablet Take 1 tablet (81 mg total) by mouth daily. Until INR > 2. Then stop and continue coumadin only.  . cloNIDine (CATAPRES - DOSED IN MG/24 HR) 0.3 mg/24hr patch Place  1 patch (0.3 mg total) onto the skin once a week.  . furosemide (LASIX) 20 MG tablet Take 1 tablet (20 mg total) by mouth 2 (two) times daily.  . hydrALAZINE (APRESOLINE) 25 MG tablet Take 25 mg by mouth 3 (three) times daily.  Marland Kitchen losartan (COZAAR) 100 MG tablet Take 100 mg by mouth at bedtime.  . metoprolol tartrate (LOPRESSOR) 50 MG tablet Take 1 tablet (50 mg total) by mouth 2 (two) times daily.  . mirtazapine (REMERON) 7.5 MG tablet TAKE 1 TABLET (7.5 MG TOTAL) BY MOUTH AT BEDTIME.  Marland Kitchen ondansetron (ZOFRAN) 4 MG tablet Take 1 tablet (4 mg total) by mouth every 8 (eight) hours as needed for nausea or vomiting.  Marland Kitchen oxyCODONE-acetaminophen (PERCOCET/ROXICET) 5-325 MG tablet Take 1 tablet by mouth 3 (three) times daily as needed  for severe pain (pain.).  Marland Kitchen rosuvastatin (CRESTOR) 10 MG tablet TAKE 1 TABLET (10 MG TOTAL) BY MOUTH DAILY.  Marland Kitchen warfarin (COUMADIN) 6 MG tablet Take 1 tablet (6 mg total) by mouth daily at 4 PM.     Allergies:   Shrimp [shellfish allergy], Bactroban [mupirocin], Vicodin [hydrocodone-acetaminophen], Eliquis [apixaban], Lisinopril, and Tylenol [acetaminophen]   Social History   Socioeconomic History  . Marital status: Single    Spouse name: Not on file  . Number of children: Not on file  . Years of education: Not on file  . Highest education level: Not on file  Occupational History  . Not on file  Tobacco Use  . Smoking status: Former Smoker    Packs/day: 0.25    Years: 44.00    Pack years: 11.00    Types: Cigarettes    Quit date: 05/24/2019    Years since quitting: 0.5  . Smokeless tobacco: Never Used  . Tobacco comment: smoking less  Substance and Sexual Activity  . Alcohol use: Yes    Alcohol/week: 2.0 standard drinks    Types: 2 Cans of beer per week    Comment:  "couple beers q weekend"  . Drug use: Yes    Types: Cocaine    Comment: "stopped back in the 1980's"  . Sexual activity: Yes    Birth control/protection: Post-menopausal  Other Topics Concern  . Not on file  Social History Narrative  . Not on file   Social Determinants of Health   Financial Resource Strain:   . Difficulty of Paying Living Expenses:   Food Insecurity:   . Worried About Charity fundraiser in the Last Year:   . Arboriculturist in the Last Year:   Transportation Needs: No Transportation Needs  . Lack of Transportation (Medical): No  . Lack of Transportation (Non-Medical): No  Physical Activity:   . Days of Exercise per Week:   . Minutes of Exercise per Session:   Stress:   . Feeling of Stress :   Social Connections:   . Frequency of Communication with Friends and Family:   . Frequency of Social Gatherings with Friends and Family:   . Attends Religious Services:   . Active Member of  Clubs or Organizations:   . Attends Archivist Meetings:   Marland Kitchen Marital Status:      Family History: The patient's family history includes Bone cancer (age of onset: 57) in her sister; Colon cancer (age of onset: 43) in her mother; Diabetes in her brother, brother, and son; Diabetes (age of onset: 55) in her sister; Hypertension in her brother, brother, mother, sister, and son; Kidney disease in  her son; Multiple sclerosis in her son. There is no history of Breast cancer or Cervical cancer.  ROS:   Please see the history of present illness.    Has been using ibuprofen for arm and back pain although she was requested to avoid analgesia other than Tylenol.  All other systems reviewed and are negative.  EKGs/Labs/Other Studies Reviewed:    The following studies were reviewed today: Multiple in-hospital and relatively recent vascular studies were reviewed  EKG:  EKG ECG reveals sinus bradycardia, borderline first-degree AV block, and essentially normal other than T wave abnormality in the anterolateral leads, not dissimilar to the prior tracing.  Recent Labs: 04/06/2019: Magnesium 2.2 05/20/2019: ALT 8 06/01/2019: B Natriuretic Peptide 732.8 08/27/2019: TSH 1.396 08/30/2019: Hemoglobin 10.5; Platelets 280 11/02/2019: BUN 44; Creatinine, Ser 3.63; Potassium 4.0; Sodium 140  Recent Lipid Panel    Component Value Date/Time   CHOL 124 05/22/2019 1700   CHOL 156 02/24/2019 1244   TRIG 132 05/22/2019 1700   HDL 42 05/22/2019 1700   HDL 54 02/24/2019 1244   CHOLHDL 3.0 05/22/2019 1700   VLDL 26 05/22/2019 1700   LDLCALC 56 05/22/2019 1700   LDLCALC 89 02/24/2019 1244    Physical Exam:    VS:  BP (!) 142/60   Pulse (!) 59   Ht 5\' 3"  (1.6 m)   Wt 109 lb 12.8 oz (49.8 kg)   SpO2 97%   BMI 19.45 kg/m     Wt Readings from Last 3 Encounters:  11/29/19 109 lb 12.8 oz (49.8 kg)  11/02/19 108 lb 6.4 oz (49.2 kg)  10/03/19 106 lb (48.1 kg)     GEN: Slender and appears older than  stated age. No acute distress HEENT: Normal NECK: No JVD. LYMPHATICS: No lymphadenopathy CARDIAC:  RRR without murmur, gallop, or edema. VASCULAR: Continuous bruit left arm related to fistula for dialysis.  Normal Pulses. No bruits. RESPIRATORY:  Clear to auscultation without rales, wheezing or rhonchi  ABDOMEN: Soft, non-tender, non-distended, No pulsatile mass, MUSCULOSKELETAL: No deformity  SKIN: Warm and dry NEUROLOGIC:  Alert and oriented x 3 PSYCHIATRIC:  Normal affect   ASSESSMENT:    1. Paroxysmal A-fib ([place])   2. H/O: stroke   3. Aortic atherosclerosis ([place])   4. Atypical chest pain   5. Paroxysmal SVT (supraventricular tachycardia) ([facility])   6. CKD (chronic kidney disease), stage IV ([place])   7. Tobacco use disorder   8. S/P aortic aneurysm repair   9. Essential hypertension   10. Right arm pain    PLAN:    In order of problems listed above:  1. Has history of atrial fibrillation and is on chronic anticoagulation therapy with Coumadin 2. We did not discuss 3. Need for secondary prevention as noted below.  Briefly discussed. 4. Right lateral pleuritic chest pain of uncertain etiology.  No evidence of ischemia on EKG. 5. No current complaints of tachycardia or SVT. 6. Stage IV chronic kidney disease.  Dialysis catheter has been removed. 7. Continues to smoke. 8. Status post repair of dissecting aortic aneurysm type a 9. Blood pressure right arm is high.  She is in pain related to pleuritic right chest discomfort which is new onset over the past 12 to 18 hours.  She feels better sitting up than lying down.  Could have pleurisy. 10. Right arm pain, neurogenic in origin, occurring after the temporary dialysis catheter was removed from the access site.  Sounds as though the could have been nerve bundle injury.  The patient is not sure why she is here other than the arm pain.  She has a cardiologist, Dr. Virgina Jock.  There is no specific evaluation that I would order this  time.  Will return care back to Dr. Arnetha Massy ago and Dr. Virgina Jock.  I do not feel that the arm pain is related to vascular disease given the blood pressure noted.  Overall education and awareness concerning secondary risk prevention was discussed in detail: LDL less than 70, hemoglobin A1c less than 7, blood pressure target less than 130/80 mmHg, >150 minutes of moderate aerobic activity per week, avoidance of smoking, weight control (via diet and exercise), and continued surveillance/management of/for obstructive sleep apnea.    Medication Adjustments/Labs and Tests Ordered: Current medicines are reviewed at length with the patient today.  Concerns regarding medicines are outlined above.  Orders Placed This Encounter  Procedures  . EKG 12-Lead   No orders of the defined types were placed in this encounter.   Patient Instructions  Medication Instructions:  Your physician recommends that you continue on your current medications as directed. Please refer to the Current Medication list given to you today.  *If you need a refill on your cardiac medications before your next appointment, please call your pharmacy*   Lab Work: None If you have labs (blood work) drawn today and your tests are completely normal, you will receive your results only by: Marland Kitchen MyChart Message (if you have MyChart) OR . A paper copy in the mail If you have any lab test that is abnormal or we need to change your treatment, we will call you to review the results.   Testing/Procedures: None   Follow-Up: At [facility], you and your health needs are our priority.  As part of our continuing mission to provide you with exceptional heart care, we have created designated Provider Care Teams.  These Care Teams include your primary Cardiologist (physician) and Advanced Practice Providers (APPs -  Physician Assistants and Nurse Practitioners) who all work together to provide you with the care you need, when you need  it.  We recommend signing up for the patient portal called "MyChart".  Sign up information is provided on this After Visit Summary.  MyChart is used to connect with patients for Virtual Visits (Telemedicine).  Patients are able to view lab/test results, encounter notes, upcoming appointments, etc.  Non-urgent messages can be sent to your provider as well.   To learn more about what you can do with MyChart, go to [email or web address].    Your next appointment:   As needed  The format for your next appointment:   In Person  Provider:   You may see Dr. Daneen Schick or one of the following Advanced Practice Providers on your designated Care Team:    Truitt Merle, NP  Cecilie Kicks, NP  Kathyrn Drown, NP    Other Instructions      Signed, Sinclair Grooms, MD  11/29/2019 11:52 AM    [place]

## 2019-11-29 ENCOUNTER — Ambulatory Visit (INDEPENDENT_AMBULATORY_CARE_PROVIDER_SITE_OTHER): Payer: Medicare Other | Admitting: Interventional Cardiology

## 2019-11-29 ENCOUNTER — Encounter: Payer: Self-pay | Admitting: Interventional Cardiology

## 2019-11-29 ENCOUNTER — Other Ambulatory Visit: Payer: Self-pay

## 2019-11-29 VITALS — BP 142/60 | HR 59 | Ht 63.0 in | Wt 109.8 lb

## 2019-11-29 DIAGNOSIS — Z8673 Personal history of transient ischemic attack (TIA), and cerebral infarction without residual deficits: Secondary | ICD-10-CM

## 2019-11-29 DIAGNOSIS — F17200 Nicotine dependence, unspecified, uncomplicated: Secondary | ICD-10-CM

## 2019-11-29 DIAGNOSIS — I471 Supraventricular tachycardia: Secondary | ICD-10-CM | POA: Diagnosis not present

## 2019-11-29 DIAGNOSIS — Z8679 Personal history of other diseases of the circulatory system: Secondary | ICD-10-CM

## 2019-11-29 DIAGNOSIS — M79601 Pain in right arm: Secondary | ICD-10-CM

## 2019-11-29 DIAGNOSIS — Z9889 Other specified postprocedural states: Secondary | ICD-10-CM

## 2019-11-29 DIAGNOSIS — N184 Chronic kidney disease, stage 4 (severe): Secondary | ICD-10-CM

## 2019-11-29 DIAGNOSIS — I700 Atherosclerosis of aorta: Secondary | ICD-10-CM

## 2019-11-29 DIAGNOSIS — R0789 Other chest pain: Secondary | ICD-10-CM

## 2019-11-29 DIAGNOSIS — I480 Paroxysmal atrial fibrillation: Secondary | ICD-10-CM

## 2019-11-29 DIAGNOSIS — I10 Essential (primary) hypertension: Secondary | ICD-10-CM

## 2019-11-29 NOTE — Patient Instructions (Signed)
Medication Instructions:  °Your physician recommends that you continue on your current medications as directed. Please refer to the Current Medication list given to you today. ° °*If you need a refill on your cardiac medications before your next appointment, please call your pharmacy* ° ° °Lab Work: °None °If you have labs (blood work) drawn today and your tests are completely normal, you will receive your results only by: °• MyChart Message (if you have MyChart) OR °• A paper copy in the mail °If you have any lab test that is abnormal or we need to change your treatment, we will call you to review the results. ° ° °Testing/Procedures: °None ° ° °Follow-Up: °At [facility], you and your health needs are our priority.  As part of our continuing mission to provide you with exceptional heart care, we have created designated Provider Care Teams.  These Care Teams include your primary Cardiologist (physician) and Advanced Practice Providers (APPs -  Physician Assistants and Nurse Practitioners) who all work together to provide you with the care you need, when you need it. ° °We recommend signing up for the patient portal called "MyChart".  Sign up information is provided on this After Visit Summary.  MyChart is used to connect with patients for Virtual Visits (Telemedicine).  Patients are able to view lab/test results, encounter notes, upcoming appointments, etc.  Non-urgent messages can be sent to your provider as well.   °To learn more about what you can do with MyChart, go to [email or web address].   ° °Your next appointment:   °As needed ° °The format for your next appointment:   °In Person ° °Provider:   °You may see Dr. Henry Smith or one of the following Advanced Practice Providers on your designated Care Team:   °· Lori Gerhardt, NP °· Laura Ingold, NP °· Jill McDaniel, NP ° ° ° °Other Instructions ° ° °

## 2019-11-30 ENCOUNTER — Encounter: Payer: Self-pay | Admitting: Family Medicine

## 2019-11-30 ENCOUNTER — Ambulatory Visit (INDEPENDENT_AMBULATORY_CARE_PROVIDER_SITE_OTHER): Payer: Medicare Other | Admitting: Family Medicine

## 2019-11-30 ENCOUNTER — Ambulatory Visit (INDEPENDENT_AMBULATORY_CARE_PROVIDER_SITE_OTHER): Payer: Medicare Other

## 2019-11-30 VITALS — BP 114/61 | HR 67 | Temp 98.0°F | Ht 63.0 in | Wt 109.8 lb

## 2019-11-30 DIAGNOSIS — I4891 Unspecified atrial fibrillation: Secondary | ICD-10-CM

## 2019-11-30 DIAGNOSIS — S60561A Insect bite (nonvenomous) of right hand, initial encounter: Secondary | ICD-10-CM

## 2019-11-30 DIAGNOSIS — Z7901 Long term (current) use of anticoagulants: Secondary | ICD-10-CM | POA: Diagnosis not present

## 2019-11-30 DIAGNOSIS — I517 Cardiomegaly: Secondary | ICD-10-CM | POA: Diagnosis not present

## 2019-11-30 DIAGNOSIS — R0789 Other chest pain: Secondary | ICD-10-CM | POA: Diagnosis not present

## 2019-11-30 DIAGNOSIS — T6391XA Toxic effect of contact with unspecified venomous animal, accidental (unintentional), initial encounter: Secondary | ICD-10-CM

## 2019-11-30 DIAGNOSIS — N184 Chronic kidney disease, stage 4 (severe): Secondary | ICD-10-CM

## 2019-11-30 NOTE — Patient Instructions (Addendum)
  Take Tylenol (acetaminophen) 500 mg 2 pills 3 times daily as needed for pain.  Do not exceed 3000 mg in 24 hours  Use diclofenac gel which you can buy over-the-counter and rub into the chest wall in the area of pain.  Do this 2-3 times daily for the next few days until the pain is doing better.  Continue your other medications.  In the event of acutely worsening chest pain or shortness of breath go to the emergency room.  If you have lab work done today you will be contacted with your lab results within the next 2 weeks.  If you have not heard from [place] then please contact us. The fastest way to get your results is to register for My Chart.   IF you received an x-ray today, you will receive an invoice from [facility]. Please contact [facility] at [phone] with questions or concerns regarding your invoice.   IF you received labwork today, you will receive an invoice from [facility]. Please contact [facility] at [phone] with questions or concerns regarding your invoice.   Our billing staff will not be able to assist you with questions regarding bills from these companies.  You will be contacted with the lab results as soon as they are available. The fastest way to get your results is to activate your My Chart account. Instructions are located on the last page of this paperwork. If you have not heard from [place] regarding the results in 2 weeks, please contact this office.

## 2019-11-30 NOTE — Progress Notes (Signed)
Patient ID: Monique Henderson, female    DOB: July 15, 1950  Age: 69 y.o. MRN: [id number]  Chief Complaint  Patient presents with  . Breast Pain    Pt stated that she has been having Rt breast pain x4 days and she can't pain.    Subjective:   Patient has been having problems with pain in her chest wall in the right.  She had a bee sting a few days ago in her right hand, but thinks she was having some of the pains before that.  She saw her cardiologist yesterday about the pain and nothing acute was found.  She has continued hurting and came in here.  No major shortness of breath.  No radiation of the pain.  Hurts in the right chest from the sternum around under the right breast.  No nausea or vomiting or diaphoresis.  She still has a little swelling her right hand was got be stung a few days ago.  Current allergies, medications, problem list, past/family and social histories reviewed.  Objective:  BP 114/61   Pulse 67   Temp 98 F (36.7 C) (Temporal)   Ht 5\' 3"  (1.6 m)   Wt 109 lb 12.8 oz (49.8 kg)   SpO2 98%   BMI 19.45 kg/m  No major acute distress.  Very lean.  Multiple scars from her thoracic surgery.  Some tattoos.  No JVD.  No carotid bruits.  Chest clear.  Heart regular regular without murmur, a little bit bradycardic.  Abdomen soft without mass or tenderness.  Her chest wall is tender in the right lower chest adjacent to the sternum and along the right costochondral border.  Abdomen soft without mass or tenderness.  Radiologist has not yet read the films, but the chest x-ray appears normal with no acute abnormalities.  EKG was done by the cardiologist yesterday and was reviewed.  No acute changes.  Chest x-ray has now been interpreted.  It did not show any active disease.  There is a question of a old right posterior rib fracture but that is not definite. Assessment & Plan:   Assessment: 1. Right-sided chest wall pain   2. Venomous sting, accidental or unintentional, initial  encounter   3. Chronic kidney disease (CKD) stage G4/A1, severely decreased glomerular filtration rate (GFR) between 15-29 mL/min/1.73 square meter and albuminuria creatinine ratio less than 30 mg/g (HCC)   4. On Coumadin for atrial fibrillation ([place])       Plan: Costochondritis chest wall pain.  She cannot take oral NSAIDs because of her anticoagulant therapy but I will have her use a little diclofenac gel on the region.  Take Tylenol.  Give it time.  Orders Placed This Encounter  Procedures  . DG Chest 2 View    Standing Status:   Future    Number of Occurrences:   1    Standing Expiration Date:   11/29/2020    Order Specific Question:   Reason for Exam (SYMPTOM  OR DIAGNOSIS REQUIRED)    Answer:   right chest wall pain    Order Specific Question:   Preferred imaging location?    Answer:   External    No orders of the defined types were placed in this encounter.        Patient Instructions    Take Tylenol (acetaminophen) 500 mg 2 pills 3 times daily as needed for pain.  Do not exceed 3000 mg in 24 hours  Use diclofenac gel which you can buy over-the-counter  and rub into the chest wall in the area of pain.  Do this 2-3 times daily for the next few days until the pain is doing better.  Continue your other medications.  In the event of acutely worsening chest pain or shortness of breath go to the emergency room.  If you have lab work done today you will be contacted with your lab results within the next 2 weeks.  If you have not heard from [place] then please contact us. The fastest way to get your results is to register for My Chart.   IF you received an x-ray today, you will receive an invoice from [facility]. Please contact [facility] at [phone] with questions or concerns regarding your invoice.   IF you received labwork today, you will receive an invoice from [facility]. Please contact [facility] at [phone] with questions or concerns regarding your  invoice.   Our billing staff will not be able to assist you with questions regarding bills from these companies.  You will be contacted with the lab results as soon as they are available. The fastest way to get your results is to activate your My Chart account. Instructions are located on the last page of this paperwork. If you have not heard from [place] regarding the results in 2 weeks, please contact this office.        Return if symptoms worsen or fail to improve.   Ruben Reason, MD 11/30/2019

## 2019-12-07 ENCOUNTER — Ambulatory Visit: Payer: Medicare Other | Admitting: Family Medicine

## 2019-12-08 ENCOUNTER — Encounter: Payer: Self-pay | Admitting: Family Medicine

## 2019-12-11 ENCOUNTER — Ambulatory Visit: Payer: Medicare Other | Admitting: Family Medicine

## 2019-12-12 ENCOUNTER — Encounter: Payer: Self-pay | Admitting: Family Medicine

## 2019-12-12 ENCOUNTER — Other Ambulatory Visit: Payer: Self-pay | Admitting: Family Medicine

## 2019-12-12 DIAGNOSIS — M545 Low back pain: Secondary | ICD-10-CM | POA: Diagnosis not present

## 2019-12-12 DIAGNOSIS — G8929 Other chronic pain: Secondary | ICD-10-CM | POA: Diagnosis not present

## 2019-12-12 DIAGNOSIS — Z79899 Other long term (current) drug therapy: Secondary | ICD-10-CM | POA: Diagnosis not present

## 2019-12-12 DIAGNOSIS — G894 Chronic pain syndrome: Secondary | ICD-10-CM | POA: Diagnosis not present

## 2020-01-03 ENCOUNTER — Other Ambulatory Visit: Payer: Self-pay | Admitting: Family Medicine

## 2020-01-03 NOTE — Telephone Encounter (Signed)
Patient is requesting a refill of the following medications: Requested Prescriptions   Pending Prescriptions Disp Refills   warfarin (COUMADIN) 6 MG tablet [Pharmacy Med Name: WARFARIN SODIUM 6MG  TABLET] 30 tablet 2    Sig: Take 1 tablet (6 mg total) by mouth daily at 4 PM.    Date of patient request: 01/03/2020 Last office visit: 11/30/2019 with Linna Darner Date of last refill: 11/03/2019 Last refill amount: 30

## 2020-01-03 NOTE — Telephone Encounter (Signed)
Requested medication (s) are due for refill today:   Provider to determine  Requested medication (s) are on the active medication list:   Yes  Future visit scheduled:   Yes in 2 days with Dr. [name]   Last ordered: 11/03/2019 #30, RF 2  Clinic note:   Returned because this is a non delegated refill   Requested Prescriptions  Pending Prescriptions Disp Refills   warfarin (COUMADIN) 6 MG tablet [Pharmacy Med Name: WARFARIN SODIUM 6MG  TABLET] 30 tablet 2    Sig: Take 1 tablet (6 mg total) by mouth daily at 4 PM.      Hematology:  Anticoagulants - warfarin Failed - 01/03/2020  9:12 AM      Failed - This refill cannot be delegated      Failed - If the patient is managed by Coumadin Clinic - route to their Pool. If not, forward to the provider.      Failed - INR in normal range and within 30 days    INR  Date Value Ref Range Status  11/02/2019 1.1 0.9 - 1.2 Final    Comment:    Reference interval is for non-anticoagulated patients. Suggested INR therapeutic range for Vitamin K antagonist therapy:    Standard Dose (moderate intensity                   therapeutic range):       2.0 - 3.0    Higher intensity therapeutic range       2.5 - 3.5           Passed - Valid encounter within last 3 months    Recent Outpatient Visits           1 month ago Right-sided chest wall pain   [facility] at [facility], Fenton Malling, MD   2 months ago Essential hypertension   [facility] at [facility], Lilia Argue, MD   3 months ago AF (paroxysmal atrial fibrillation) [facility])   [facility] at [facility], Lilia Argue, MD   5 months ago On Coumadin for atrial fibrillation [facility])   [facility] at [facility], Lilia Argue, MD   7 months ago ESRD on dialysis [facility])   [facility] at [facility], Lilia Argue, MD       Future Appointments             In 2 days [name], MD Primary Care at [place], [facility]

## 2020-01-05 ENCOUNTER — Encounter: Payer: Self-pay | Admitting: Family Medicine

## 2020-01-05 ENCOUNTER — Other Ambulatory Visit: Payer: Self-pay

## 2020-01-05 ENCOUNTER — Ambulatory Visit (INDEPENDENT_AMBULATORY_CARE_PROVIDER_SITE_OTHER): Payer: Medicare Other | Admitting: Family Medicine

## 2020-01-05 VITALS — BP 148/78 | HR 79 | Temp 98.5°F | Ht 63.0 in | Wt 103.0 lb

## 2020-01-05 DIAGNOSIS — Z7901 Long term (current) use of anticoagulants: Secondary | ICD-10-CM | POA: Diagnosis not present

## 2020-01-05 DIAGNOSIS — I10 Essential (primary) hypertension: Secondary | ICD-10-CM

## 2020-01-05 DIAGNOSIS — I4891 Unspecified atrial fibrillation: Secondary | ICD-10-CM | POA: Diagnosis not present

## 2020-01-05 DIAGNOSIS — E7800 Pure hypercholesterolemia, unspecified: Secondary | ICD-10-CM | POA: Diagnosis not present

## 2020-01-05 DIAGNOSIS — Z8673 Personal history of transient ischemic attack (TIA), and cerebral infarction without residual deficits: Secondary | ICD-10-CM

## 2020-01-05 MED ORDER — FUROSEMIDE 20 MG PO TABS: 20.0000 mg | ORAL_TABLET | Freq: Every day | ORAL | 5 refills | Status: DC

## 2020-01-05 MED ORDER — METOPROLOL SUCCINATE ER 50 MG PO TB24: 50.0000 mg | ORAL_TABLET | Freq: Every day | ORAL | 1 refills | Status: DC

## 2020-01-05 MED ORDER — HYDRALAZINE HCL 25 MG PO TABS: 25.0000 mg | ORAL_TABLET | Freq: Two times a day (BID) | ORAL | Status: DC

## 2020-01-05 NOTE — Progress Notes (Signed)
8/6/20212:51 PM  Monique Henderson 01/07/1951, 69 y.o., female [id number]  Chief Complaint  Patient presents with  . Follow-up    has not had any pain in the chest, no taking omega xl supplements. Eats plenty of food daily, concerned of no weight gain    HPI:   Patient is a 69 y.o. female with past medical history significant for HTN,afib, CVA dec 2020, Aortic dissection, cardiac arrest, ESRD on HD, GERD, chronic low back pain s/p lumbar fusion on opiates, bilateral breast cancer s/p radiation, HCV s/p treatment/curewho presents today for routine followup  Last OV June 2021 - no changes She reports doing well She has no acute concerns today She brings in her meds for review Saw cards - they did not set her up with coumadin clinic Has been taking asa Has been taking lasix 20mg  daily not bid Has been doing metoprolol tartate once a day not BID Has been doing hydralazine 25mg  BID She took her meds this morning Her appetite is good No cp, palpitations, swelling, SOB, cough She denies any abnormal bleeding  Wt Readings from Last 3 Encounters:  01/05/20 103 lb (46.7 kg)  11/30/19 109 lb 12.8 oz (49.8 kg)  11/29/19 109 lb 12.8 oz (49.8 kg)   BP Readings from Last 3 Encounters:  01/05/20 (!) 177/84  11/30/19 114/61  11/29/19 (!) 142/60    Depression screen PHQ 2/9 11/30/2019 11/02/2019 10/03/2019  Decreased Interest 0 0 0  Down, Depressed, Hopeless 0 0 0  PHQ - 2 Score 0 0 0  Some recent data might be hidden    Fall Risk  01/05/2020 11/30/2019 11/02/2019 10/03/2019 07/31/2019  Falls in the past year? 0 0 0 0 0  Number falls in past yr: 0 0 0 0 0  Injury with Fall? 0 0 1 0 0  Risk for fall due to : - - - - -  Risk for fall due to: Comment - - - - -  Follow up - Falls evaluation completed - Falls evaluation completed Falls evaluation completed     Allergies  Allergen Reactions  . Shrimp [Shellfish Allergy] Shortness Of Breath  . Bactroban [Mupirocin] Other (See Comments)     "Sores in nose"  . Vicodin [Hydrocodone-Acetaminophen] Itching and Nausea And Vomiting    This is patient's home medication  . Eliquis [Apixaban] Rash  . Lisinopril Cough  . Tylenol [Acetaminophen] Itching    Prior to Admission medications   Medication Sig Start Date End Date Taking? Authorizing Provider  albuterol (VENTOLIN HFA) 108 (90 Base) MCG/ACT inhaler INHALE 2 PUFFS INTO THE LUNGS EVERY 6 (SIX) HOURS AS NEEDED FOR WHEEZING OR SHORTNESS OF BREATH. 10/04/19   [name], MD  amLODipine (NORVASC) 10 MG tablet TAKE 1 TABLET (10 MG TOTAL) BY MOUTH DAILY. 09/20/18   [name], MD  aspirin EC 81 MG tablet Take 1 tablet (81 mg total) by mouth daily. Until INR > 2. Then stop and continue coumadin only. 05/23/19   Dessa Phi, DO  cloNIDine (CATAPRES - DOSED IN MG/24 HR) 0.3 mg/24hr patch Place 1 patch (0.3 mg total) onto the skin once a week. 04/22/19   Nani Skillern, PA-C  furosemide (LASIX) 20 MG tablet Take 1 tablet (20 mg total) by mouth 2 (two) times daily. 11/03/19   [name], MD  hydrALAZINE (APRESOLINE) 25 MG tablet Take 25 mg by mouth 3 (three) times daily.    [provider]  losartan (COZAAR) 100 MG tablet  Take 100 mg by mouth at bedtime. 08/02/19   [provider]  metoprolol tartrate (LOPRESSOR) 50 MG tablet Take 1 tablet (50 mg total) by mouth 2 (two) times daily. 08/03/19   [name], MD  mirtazapine (REMERON) 7.5 MG tablet TAKE 1 TABLET (7.5 MG TOTAL) BY MOUTH AT BEDTIME. 12/12/19   [name], MD  ondansetron (ZOFRAN) 4 MG tablet Take 1 tablet (4 mg total) by mouth every 8 (eight) hours as needed for nausea or vomiting. 08/03/19   [name], MD  oxyCODONE-acetaminophen (PERCOCET/ROXICET) 5-325 MG tablet Take 1 tablet by mouth 3 (three) times daily as needed for severe pain (pain.).    [provider]  rosuvastatin (CRESTOR) 10 MG tablet TAKE 1 TABLET (10 MG TOTAL) BY MOUTH DAILY. 11/13/19 02/11/20  Patwardhan,  Reynold Bowen, MD  warfarin (COUMADIN) 6 MG tablet TAKE 1 TABLET (6 MG TOTAL) BY MOUTH DAILY AT 4 PM. 01/04/20   [name], MD    Past Medical History:  Diagnosis Date  . AF (paroxysmal atrial fibrillation) ([place]) 05/29/2019  . Aortic atherosclerosis ([place]) 07/05/2019  . Aortic dissection ([place]) 04/04/2019  . Atelectasis 2002   Bilateral  . Atrial fibrillation with RVR ([place]) 05/18/2019  . Atypical chest pain 07/05/2019  . Bone spur 2008   Right calcaneal foot spur  . Breast cancer ([place]) 2004   Ductal carcinoma in situ of the left breast; S/P left partial mastectomy 02/26/2003; S/P re-excision of cranial and lateral margins11/18/2004.radiation  . Breast cancer ([place]) 09/21/2012   right breast/ last radiation treatment 03/22/2013  . Cerebral thrombosis with cerebral infarction 05/22/2019  . Chronic kidney disease, stage IV (severe) ([place]) 10/10/2007  . Chronic low back pain 06/22/2016  . Chronic obstructive lung disease ([place]) 01/16/2017  . CKD (chronic kidney disease), stage IV ([place]) 07/19/2017  . 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 Autumn Messing; S/P re-excision of superior and inferior margins 10/27/2012.   . Dissection of aorta ([place]) 04/03/2019  . Elevated troponin 08/28/2019  . ESRD on dialysis ([place]) 05/29/2019  . Essential hypertension 09/16/2006  . GERD 09/16/2006  . GERD (gastroesophageal reflux disease)   . H/O: stroke 05/29/2019  . 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 Hot flashes   . Hx of radiation therapy 2005   left breast  . Hx of radiation therapy 01/11/13- 03/22/13   right breast 5760 cGy 30 sessions  . Hyperkalemia 08/28/2019  . Hypertension   . Insomnia 03/14/2015  . Low back pain   . Lumbar spinal stenosis    S/P lumbar decompressive laminectomy, fusion and plating for lumbar spinal stensosis  . Malnutrition of moderate degree 05/19/2019  . Non compliance with medical treatment 12/04/2017    . Normocytic anemia    With thrombocytosis  . On Coumadin for atrial fibrillation ([place]) 10/03/2019  . Osteoarthritis   . Paroxysmal SVT (supraventricular tachycardia) ([place]) 08/27/2019  . Personal history of radiation therapy   . Pleural effusion 05/18/2019  . Positive D dimer 08/28/2019  . Right ureteral stone 2002  . S/P aortic aneurysm repair 04/07/2019  . S/P lumbar spinal fusion 01/18/2014   S/P lumbar decompressive laminectomy, fusion, and plating for lumbar spinal stenosis on 05/27/2009 by Dr. Eustace Moore.  S/P anterolateral retroperitoneal interbody fusion L2-3 utilizing a 8 mm peek interbody cage packed with morcellized allograft, and anterior lumbar plating L2-3 for recurrent disc herniation  L2-3 with spinal stenosis on 01/18/2014 by Dr. Eustace Moore.    . Shortness of breath    from pain  . SIRS (systemic inflammatory response syndrome) ([place]) 08/27/2019  . SVT (supraventricular tachycardia) ([place]) 08/28/2019  . Tobacco use disorder 04/19/2009  . Uterine fibroid   . Wears dentures    top    Past Surgical History:  Procedure Laterality Date  . ANTERIOR LAT LUMBAR FUSION N/A 01/18/2014   Procedure: ANTERIOR LATERAL LUMBAR FUSION LUMBAR TWO-THREE;  Surgeon: Eustace Moore, MD;  Location: [place] [facility];  Service: Neurosurgery;  Laterality: N/A;  . ANTERIOR LUMBAR FUSION  01/18/2014  . AV FISTULA PLACEMENT Left 04/20/2019   Procedure: ARTERIOVENOUS (AV) FISTULA CREATION;  Surgeon: [name], MD;  Location: [place];  Service: Vascular;  Laterality: Left;  . BACK SURGERY    . BREAST LUMPECTOMY Left 01/2003  . BREAST LUMPECTOMY Right 2014  . BREAST LUMPECTOMY WITH NEEDLE LOCALIZATION AND AXILLARY SENTINEL LYMPH NODE BX Right 10/13/2012   Procedure: BREAST LUMPECTOMY WITH NEEDLE LOCALIZATION;  Surgeon: [name], MD;  Location: [place];  Service: General;  Laterality: Right;  Right breast wire localized lumpectomy  . INSERTION OF DIALYSIS CATHETER Right  04/20/2019   Procedure: INSERTION OF DIALYSIS CATHETER, right internal jugular;  Surgeon: [name], MD;  Location: [place];  Service: Vascular;  Laterality: Right;  . IR THORACENTESIS ASP PLEURAL SPACE W/IMG GUIDE  05/19/2019  . LAMINECTOMY  05/27/2009   Lumbar decompressive laminectomy, fusion and plating for lumbar spinal stensosis  . LUMBAR LAMINECTOMY/DECOMPRESSION MICRODISCECTOMY Left 03/23/2013   Procedure: LUMBAR LAMINECTOMY/DECOMPRESSION MICRODISCECTOMY 1 LEVEL;  Surgeon: Eustace Moore, MD;  Location: [place] [facility];  Service: Neurosurgery;  Laterality: Left;  LUMBAR LAMINECTOMY/DECOMPRESSION MICRODISCECTOMY 1 LEVEL  . MASTECTOMY, PARTIAL Left 02/26/2003   ; S/P re-excision of cranial and lateral margins 04/19/2003.   Marland Kitchen RE-EXCISION OF BREAST CANCER,SUPERIOR MARGINS Right 10/27/2012   Procedure: RE-EXCISION OF BREAST CANCER,SUPERIOR and inferior MARGINS;  Surgeon: [name], MD;  Location: [place];  Service: General;  Laterality: Right;  . RE-EXCISION OF BREAST LUMPECTOMY Left 04/2003  . TEE WITHOUT CARDIOVERSION N/A 04/04/2019   Procedure: Transesophageal Echocardiogram ([place]);  Surgeon: Wonda Olds, MD;  Location: [place];  Service: Open Heart Surgery;  Laterality: N/A;  . THORACIC AORTIC ANEURYSM REPAIR N/A 04/04/2019   Procedure: THORACIC ASCENDING ANEURYSM REPAIR (AAA)  USING 28 MM X 30 CM HEMASHIELD PLATINUM VASCULAR GRAFT;  Surgeon: Wonda Olds, MD;  Location: [facility];  Service: Open Heart Surgery;  Laterality: N/A;    Social History   Tobacco Use  . Smoking status: Former Smoker    Packs/day: 0.25    Years: 44.00    Pack years: 11.00    Types: Cigarettes    Quit date: 05/24/2019    Years since quitting: 0.6  . Smokeless tobacco: Never Used  . Tobacco comment: smoking less  Substance Use Topics  . Alcohol use: Yes    Alcohol/week: 2.0 standard drinks    Types: 2 Cans of beer per week    Comment:  "couple beers q weekend"    Family History    Problem Relation Age of Onset  . Colon cancer Mother 49  . Hypertension Mother   . Diabetes Sister 59  . Hypertension Sister   . Diabetes Brother   . Hypertension Brother   . Diabetes Brother   . Hypertension Brother   . Kidney disease Son  On dialysis  . Hypertension Son   . Diabetes Son   . Multiple sclerosis Son   . Bone cancer Sister 37  . Breast cancer Neg Hx   . Cervical cancer Neg Hx     ROS Per hpi  OBJECTIVE:  Today's Vitals   01/05/20 1430 01/05/20 1526  BP: (!) 177/84 (!) 148/78  Pulse: 79   Temp: 98.5 F (36.9 C)   SpO2: 100%   Weight: 103 lb (46.7 kg)   Height: 5\' 3"  (1.6 m)    Body mass index is 18.25 kg/m.    Physical Exam Vitals and nursing note reviewed.  Constitutional:      Appearance: She is well-developed.  HENT:     Head: Normocephalic and atraumatic.     Mouth/Throat:     Pharynx: No oropharyngeal exudate.  Eyes:     General: No scleral icterus.    Extraocular Movements: Extraocular movements intact.     Conjunctiva/sclera: Conjunctivae normal.     Pupils: Pupils are equal, round, and reactive to light.  Cardiovascular:     Rate and Rhythm: Normal rate and regular rhythm.     Heart sounds: Normal heart sounds. No murmur heard.  No friction rub. No gallop.   Pulmonary:     Effort: Pulmonary effort is normal.     Breath sounds: Normal breath sounds. No wheezing, rhonchi or rales.  Musculoskeletal:     Cervical back: Neck supple.     Right lower leg: No edema.     Left lower leg: No edema.  Skin:    General: Skin is warm and dry.  Neurological:     Mental Status: She is alert and oriented to person, place, and time.     No results found for this or any previous visit (from the past 24 hour(s)).  No results found.   ASSESSMENT and PLAN  1. On Coumadin for atrial fibrillation ([place]) Checking labs today, medications will be adjusted as needed.  - CBC - Protime-INR  2. Essential hypertension Slightly above goal,  changing metoprolol to long acting.  - Comprehensive metabolic panel  3. H/O: stroke Cont secondary prevention  4. Pure hypercholesterolemia Checking labs today, medications will be adjusted as needed. LDL goal < 70 - Lipid panel  Other orders - furosemide (LASIX) 20 MG tablet; Take 1 tablet (20 mg total) by mouth daily. - hydrALAZINE (APRESOLINE) 25 MG tablet; Take 1 tablet (25 mg total) by mouth 2 (two) times daily. - metoprolol succinate (TOPROL-XL) 50 MG 24 hr tablet; Take 1 tablet (50 mg total) by mouth daily. Take with or immediately following a meal.  Return in about 4 weeks (around 02/02/2020).    [name], MD Primary Care at [facility] [place], [place] [address] Ph.  [phone] Fax [phone]

## 2020-01-05 NOTE — Patient Instructions (Signed)
° ° ° °  If you have lab work done today you will be contacted with your lab results within the next 2 weeks.  If you have not heard from us then please contact us. The fastest way to get your results is to register for My Chart. ° ° °IF you received an x-ray today, you will receive an invoice from [facility]. Please contact [facility] at [phone] with questions or concerns regarding your invoice.  ° °IF you received labwork today, you will receive an invoice from [facility]. Please contact [facility] at [phone] with questions or concerns regarding your invoice.  ° °Our billing staff will not be able to assist you with questions regarding bills from these companies. ° °You will be contacted with the lab results as soon as they are available. The fastest way to get your results is to activate your My Chart account. Instructions are located on the last page of this paperwork. If you have not heard from us regarding the results in 2 weeks, please contact this office. °  ° ° ° °

## 2020-01-06 LAB — CBC
Hematocrit: 22.9 % — ABNORMAL LOW (ref 34.0–46.6)
Hemoglobin: 7.4 g/dL — ABNORMAL LOW (ref 11.1–15.9)
MCH: 30.5 pg (ref 26.6–33.0)
MCHC: 32.3 g/dL (ref 31.5–35.7)
MCV: 94 fL (ref 79–97)
Platelets: 339 10*3/uL (ref 150–450)
RBC: 2.43 x10E6/uL — CL (ref 3.77–5.28)
RDW: 13.6 % (ref 11.7–15.4)
WBC: 6.8 10*3/uL (ref 3.4–10.8)

## 2020-01-06 LAB — COMPREHENSIVE METABOLIC PANEL
ALT: 15 IU/L (ref 0–32)
AST: 19 IU/L (ref 0–40)
Albumin/Globulin Ratio: 1.8 (ref 1.2–2.2)
Albumin: 4.6 g/dL (ref 3.8–4.8)
Alkaline Phosphatase: 95 IU/L (ref 48–121)
BUN/Creatinine Ratio: 13 (ref 12–28)
BUN: 48 mg/dL — ABNORMAL HIGH (ref 8–27)
Bilirubin Total: 0.3 mg/dL (ref 0.0–1.2)
CO2: 25 mmol/L (ref 20–29)
Calcium: 9.3 mg/dL (ref 8.7–10.3)
Chloride: 105 mmol/L (ref 96–106)
Creatinine, Ser: 3.78 mg/dL (ref 0.57–1.00)
GFR calc Af Amer: 13 mL/min/{1.73_m2} — ABNORMAL LOW (ref >59)
GFR calc non Af Amer: 12 mL/min/{1.73_m2} — ABNORMAL LOW (ref >59)
Globulin, Total: 2.5 g/dL (ref 1.5–4.5)
Glucose: 99 mg/dL (ref 65–99)
Potassium: 4.3 mmol/L (ref 3.5–5.2)
Sodium: 143 mmol/L (ref 134–144)
Total Protein: 7.1 g/dL (ref 6.0–8.5)

## 2020-01-06 LAB — PROTIME-INR
INR: 2 — ABNORMAL HIGH (ref 0.9–1.2)
Prothrombin Time: 20.3 s — ABNORMAL HIGH (ref 9.1–12.0)

## 2020-01-06 LAB — LIPID PANEL
Chol/HDL Ratio: 2.1 ratio (ref 0.0–4.4)
Cholesterol, Total: 125 mg/dL (ref 100–199)
HDL: 59 mg/dL (ref >39)
LDL Chol Calc (NIH): 50 mg/dL (ref 0–99)
Triglycerides: 82 mg/dL (ref 0–149)
VLDL Cholesterol Cal: 16 mg/dL (ref 5–40)

## 2020-01-10 ENCOUNTER — Ambulatory Visit: Payer: Medicare Other | Admitting: Adult Health

## 2020-01-10 NOTE — Progress Notes (Deleted)
[facility] [address] [place]. [place] [address] [phone]       OFFICE FOLLOW-UP NOTE  [name] Date of Birth:  Sep 27, 1950 Medical Record Number:  [id number]   Chief complaint: No chief complaint on file.      HPI:       History provided for reference purposes only Initial visit 07/12/2019 Dr. Leonie Man: 69 year old African-American lady seen today for initial office follow-up visit following hospital consultation for stroke in December 2021.  History is obtained from the patient, review of electronic medical records and I personally reviewed imaging films in PACS.  Patient was admitted following shortness of breath after dialysis on 05/17/2020 and was found to have large left-sided pleural effusion and underwent thoracocentesis on 05/19/2019 which yielded 320 cc of serosanguineous fluid following this patient was found to have sudden change in level of consciousness and was not found to be responding to questions and state often fell asleep and gradually got better.  CT scan was obtained which showed multiple remote rib strokes but no acute findings.  She was thought to have had a seizure and was given Ativan and fosphenytoin however EEG did not show evidence of seizure activity.  Eventually MRI scan was obtained which showed a small left parietal white matter infarct.  MRI of the brain showed mild irregularities of intracranial vessels but no large vessel stenosis or occlusion.  Transthoracic echo showed normal ejection fraction.  Carotid ultrasound showed bilateral 40 to 59% extracranial carotid stenosis.  LDL cholesterol 56 mg percent.  Patient was found to have atrial fibrillation and was started on warfarin.  Patient states she is done well since discharge.  She has had no further episodes of stroke or TIAs.  She remains on warfarin and her INR has been stable.  She states her blood pressure is normal is much better though today it is elevated in office  at 198/102.  She has no new complaints.  Her left arm was swollen and her AV fistula is probably thrombosed and will need to be replaced.  ROS:   14 system review of systems is positive for left arm pain and swelling and all other systems negative PMH:  Past Medical History:  Diagnosis Date  . AF (paroxysmal atrial fibrillation) ([place]) 05/29/2019  . Aortic atherosclerosis ([place]) 07/05/2019  . Aortic dissection ([place]) 04/04/2019  . Atelectasis 2002   Bilateral  . Atrial fibrillation with RVR ([place]) 05/18/2019  . Atypical chest pain 07/05/2019  . Bone spur 2008   Right calcaneal foot spur  . Breast cancer ([place]) 2004   Ductal carcinoma in situ of the left breast; S/P left partial mastectomy 02/26/2003; S/P re-excision of cranial and lateral margins11/18/2004.radiation  . Breast cancer ([place]) 09/21/2012   right breast/ last radiation treatment 03/22/2013  . Cerebral thrombosis with cerebral infarction 05/22/2019  . Chronic kidney disease, stage IV (severe) ([place]) 10/10/2007  . Chronic low back pain 06/22/2016  . Chronic obstructive lung disease ([place]) 01/16/2017  . CKD (chronic kidney disease), stage IV ([place]) 07/19/2017  . 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 Autumn Messing; S/P re-excision of superior and inferior margins 10/27/2012.   . Dissection of aorta ([place]) 04/03/2019  . Elevated troponin 08/28/2019  . ESRD on dialysis ([place]) 05/29/2019  . Essential hypertension 09/16/2006  . GERD 09/16/2006  . GERD (gastroesophageal reflux disease)   . H/O: stroke 05/29/2019  . Hepatitis C    treated  and RNA confirmed not detectable 01/2017  . Hot flashes   . Hx of radiation therapy 2005   left breast  . Hx of radiation therapy 01/11/13- 03/22/13   right breast 5760 cGy 30 sessions  . Hyperkalemia 08/28/2019  . Hypertension   . Insomnia 03/14/2015  . Low back pain   . Lumbar spinal stenosis    S/P lumbar decompressive laminectomy, fusion and plating for  lumbar spinal stensosis  . Malnutrition of moderate degree 05/19/2019  . Non compliance with medical treatment 12/04/2017  . Normocytic anemia    With thrombocytosis  . On Coumadin for atrial fibrillation ([place]) 10/03/2019  . Osteoarthritis   . Paroxysmal SVT (supraventricular tachycardia) ([place]) 08/27/2019  . Personal history of radiation therapy   . Pleural effusion 05/18/2019  . Positive D dimer 08/28/2019  . Right ureteral stone 2002  . S/P aortic aneurysm repair 04/07/2019  . S/P lumbar spinal fusion 01/18/2014   S/P lumbar decompressive laminectomy, fusion, and plating for lumbar spinal stenosis on 05/27/2009 by Dr. Eustace Moore.  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 Eustace Moore.    . Shortness of breath    from pain  . SIRS (systemic inflammatory response syndrome) ([place]) 08/27/2019  . SVT (supraventricular tachycardia) ([place]) 08/28/2019  . Tobacco use disorder 04/19/2009  . Uterine fibroid   . Wears dentures    top    Social History:  Social History   Socioeconomic History  . Marital status: Single    Spouse name: Not on file  . Number of children: Not on file  . Years of education: Not on file  . Highest education level: Not on file  Occupational History  . Not on file  Tobacco Use  . Smoking status: Former Smoker    Packs/day: 0.25    Years: 44.00    Pack years: 11.00    Types: Cigarettes    Quit date: 05/24/2019    Years since quitting: 0.6  . Smokeless tobacco: Never Used  . Tobacco comment: smoking less  Substance and Sexual Activity  . Alcohol use: Yes    Alcohol/week: 2.0 standard drinks    Types: 2 Cans of beer per week    Comment:  "couple beers q weekend"  . Drug use: Yes    Types: Cocaine    Comment: "stopped back in the 1980's"  . Sexual activity: Yes     Birth control/protection: Post-menopausal  Other Topics Concern  . Not on file  Social History Narrative  . Not on file   Social Determinants of Health   Financial Resource Strain:   . Difficulty of Paying Living Expenses:   Food Insecurity:   . Worried About Charity fundraiser in the Last Year:   . Arboriculturist in the Last Year:   Transportation Needs: No Transportation Needs  . Lack of Transportation (Medical): No  . Lack of Transportation (Non-Medical): No  Physical Activity:   . Days of Exercise per Week:   . Minutes of Exercise per Session:   Stress:   . Feeling of Stress :   Social Connections:   . Frequency of Communication with Friends and Family:   . Frequency of Social Gatherings with Friends and Family:   . Attends Religious Services:   . Active Member of Clubs or Organizations:   .  Attends Archivist Meetings:   Marland Kitchen Marital Status:   Intimate Partner Violence:   . Fear of Current or Ex-Partner:   . Emotionally Abused:   Marland Kitchen Physically Abused:   . Sexually Abused:     Medications:   Current Outpatient Medications on File Prior to Visit  Medication Sig Dispense Refill  . albuterol (VENTOLIN HFA) 108 (90 Base) MCG/ACT inhaler INHALE 2 PUFFS INTO THE LUNGS EVERY 6 (SIX) HOURS AS NEEDED FOR WHEEZING OR SHORTNESS OF BREATH. 8.5 g 0  . amLODipine (NORVASC) 10 MG tablet TAKE 1 TABLET (10 MG TOTAL) BY MOUTH DAILY. 90 tablet 0  . cloNIDine (CATAPRES - DOSED IN MG/24 HR) 0.3 mg/24hr patch Place 1 patch (0.3 mg total) onto the skin once a week. 4 patch 2  . furosemide (LASIX) 20 MG tablet Take 1 tablet (20 mg total) by mouth daily. 60 tablet 5  . hydrALAZINE (APRESOLINE) 25 MG tablet Take 1 tablet (25 mg total) by mouth 2 (two) times daily.    Marland Kitchen losartan (COZAAR) 100 MG tablet Take 100 mg by mouth at bedtime.    . metoprolol succinate (TOPROL-XL) 50 MG 24 hr tablet Take 1 tablet (50 mg total) by mouth daily. Take with or immediately following a meal. 90 tablet 1    . mirtazapine (REMERON) 7.5 MG tablet TAKE 1 TABLET (7.5 MG TOTAL) BY MOUTH AT BEDTIME. 30 tablet 2  . ondansetron (ZOFRAN) 4 MG tablet Take 1 tablet (4 mg total) by mouth every 8 (eight) hours as needed for nausea or vomiting. 20 tablet 3  . oxyCODONE-acetaminophen (PERCOCET/ROXICET) 5-325 MG tablet Take 1 tablet by mouth 3 (three) times daily as needed for severe pain (pain.).    Marland Kitchen rosuvastatin (CRESTOR) 10 MG tablet TAKE 1 TABLET (10 MG TOTAL) BY MOUTH DAILY. 30 tablet 3  . warfarin (COUMADIN) 6 MG tablet TAKE 1 TABLET (6 MG TOTAL) BY MOUTH DAILY AT 4 PM. 30 tablet 0   No current facility-administered medications on file prior to visit.    Allergies:   Allergies  Allergen Reactions  . Shrimp [Shellfish Allergy] Shortness Of Breath  . Bactroban [Mupirocin] Other (See Comments)    "Sores in nose"  . Vicodin [Hydrocodone-Acetaminophen] Itching and Nausea And Vomiting    This is patient's home medication  . Eliquis [Apixaban] Rash  . Lisinopril Cough  . Tylenol [Acetaminophen] Itching     There were no vitals filed for this visit. There is no height or weight on file to calculate BMI.    Physical Exam General: Frail cachectic looking middle-aged African-American lady, seated, in no evident distress Head: head normocephalic and atraumatic.  Neck: supple with no carotid or supraclavicular bruits Cardiovascular: regular rate and rhythm, soft ejection systolic murmur. Musculoskeletal: no deformity Skin:  no rash/petichiae Vascular:  Normal pulses all extremities except left upper extremity which is swollen and has AV fistula   Neurologic Exam Mental Status: Awake and fully alert. Oriented to place and time. Recent and remote memory intact. Attention span, concentration and fund of knowledge appropriate. Mood and affect appropriate.  Cranial Nerves: Fundoscopic exam not done. Pupils equal, briskly reactive to light. Extraocular movements full without nystagmus. Visual fields full  to confrontation. Hearing intact. Facial sensation intact. Face, tongue, palate moves normally and symmetrically.  Motor: Normal bulk and tone. Normal strength in all tested extremity muscles. Sensory.: intact to touch ,pinprick .position and vibratory sensation.  Coordination: Rapid alternating movements normal in all extremities. Finger-to-nose and heel-to-shin performed accurately bilaterally.  Gait and Station: Arises from chair without difficulty. Stance is normal. Gait demonstrates normal stride length and balance . Able to heel, toe and tandem walk with mild difficulty.  Reflexes: 1+ and symmetric. Toes downgoing.        ASSESSMENT/PLAN: 69 year-old lady with small left parietal white matter infarct in December 2021 in the setting of atrial fibrillation.  Multiple vascular risk factors of hypertension,, age and atrial fibrillation     1. L parietal WM stroke: Residual deficit: ***. Continue warfarin daily  and ***  for secondary stroke prevention. Close PCP f/u for aggressive stroke risk factor management  2. Atrial fibrillation: Continue warfarin.  Ongoing follow-up with cardiology for monitoring and management 3. HTN: BP goal <130/90. Stable today. Continue f/u with PCP 4. HLD: LDL goal <70. Recent LDL ***. F/u with PCP for management and prescribing      I spent *** minutes of face-to-face and non-face-to-face time with patient.  This included previsit chart review, lab review, study review, order entry, electronic health record documentation, patient education  Frann Rider, [facility]  [facility] [address] [place], [place] [address]  Phone [phone] Fax [phone] Note: This document was prepared with digital dictation and possible smart phrase technology. Any transcriptional errors that result from this process are unintentional.

## 2020-01-12 ENCOUNTER — Other Ambulatory Visit: Payer: Self-pay | Admitting: Family Medicine

## 2020-01-12 DIAGNOSIS — M5136 Other intervertebral disc degeneration, lumbar region: Secondary | ICD-10-CM | POA: Diagnosis not present

## 2020-01-12 DIAGNOSIS — G894 Chronic pain syndrome: Secondary | ICD-10-CM | POA: Diagnosis not present

## 2020-01-12 DIAGNOSIS — Z79899 Other long term (current) drug therapy: Secondary | ICD-10-CM | POA: Diagnosis not present

## 2020-01-30 ENCOUNTER — Telehealth: Payer: Self-pay | Admitting: Emergency Medicine

## 2020-01-30 NOTE — Telephone Encounter (Signed)
Still unable to reach patient by phone after several attempts. Will try again tomorrow.

## 2020-01-30 NOTE — Telephone Encounter (Signed)
Left detail msg on patient machine thinking I was speaking to the patient until the phone beeped. I was telling the patient that her hemoglobin was 7.4 and she is currently taking coumadin medication and Dr [name] need her to go to the ER with these low level and being on coumadin medication. Was unable to leave further msg after phone beeped. So I called again and left anther msg for patient to return call so I can explain in detail what DR [place] orders are and to be make sure she understood the message. I also called patient sister and left a msg to have patient to contact our office.

## 2020-02-06 ENCOUNTER — Ambulatory Visit: Payer: Medicare Other | Admitting: Family Medicine

## 2020-02-07 ENCOUNTER — Encounter: Payer: Self-pay | Admitting: Family Medicine

## 2020-02-14 DIAGNOSIS — Z79899 Other long term (current) drug therapy: Secondary | ICD-10-CM | POA: Diagnosis not present

## 2020-02-14 DIAGNOSIS — M5136 Other intervertebral disc degeneration, lumbar region: Secondary | ICD-10-CM | POA: Diagnosis not present

## 2020-02-14 DIAGNOSIS — G894 Chronic pain syndrome: Secondary | ICD-10-CM | POA: Diagnosis not present

## 2020-02-20 ENCOUNTER — Telehealth: Payer: Self-pay | Admitting: Family Medicine

## 2020-02-20 DIAGNOSIS — J449 Chronic obstructive pulmonary disease, unspecified: Secondary | ICD-10-CM

## 2020-02-20 MED ORDER — ALBUTEROL SULFATE HFA 108 (90 BASE) MCG/ACT IN AERS: 2.0000 | INHALATION_SPRAY | Freq: Four times a day (QID) | RESPIRATORY_TRACT | 0 refills | Status: DC | PRN

## 2020-02-20 NOTE — Telephone Encounter (Signed)
Pt called and is needing her inhaler sent into the pharmacy. Please advise.

## 2020-03-14 DIAGNOSIS — Z79899 Other long term (current) drug therapy: Secondary | ICD-10-CM | POA: Diagnosis not present

## 2020-03-14 DIAGNOSIS — M5136 Other intervertebral disc degeneration, lumbar region: Secondary | ICD-10-CM | POA: Diagnosis not present

## 2020-03-14 DIAGNOSIS — G894 Chronic pain syndrome: Secondary | ICD-10-CM | POA: Diagnosis not present

## 2020-03-21 ENCOUNTER — Ambulatory Visit: Payer: Self-pay

## 2020-04-15 DIAGNOSIS — G894 Chronic pain syndrome: Secondary | ICD-10-CM | POA: Diagnosis not present

## 2020-04-15 DIAGNOSIS — M5136 Other intervertebral disc degeneration, lumbar region: Secondary | ICD-10-CM | POA: Diagnosis not present

## 2020-04-15 DIAGNOSIS — Z79899 Other long term (current) drug therapy: Secondary | ICD-10-CM | POA: Diagnosis not present

## 2020-04-17 ENCOUNTER — Telehealth: Payer: Self-pay | Admitting: Registered Nurse

## 2020-04-17 DIAGNOSIS — I4891 Unspecified atrial fibrillation: Secondary | ICD-10-CM

## 2020-04-17 NOTE — Telephone Encounter (Signed)
-----   Message from Maximiano Coss, NP sent at 04/17/2020 11:58 AM EST ----- Regarding: PT INR Lab Good morning,  If we could have the attached patient scheduled for PT INR Lab Only visit once monthly recurring that would be great. Her visit with me to [facility] in December may serve as that visit. I'd definitely like one sooner than that, though  Thank you  Kathrin Ruddy, NP

## 2020-04-17 NOTE — Telephone Encounter (Signed)
Tried calling pt to schedule nurse visit for labs. Pt vm was full and was unable to leave a message will try again later. Please advise.

## 2020-04-19 NOTE — Telephone Encounter (Signed)
I have called pt and scheduled her for a Lab visit for Monday 04/22/20 @ 920am.   Pt is to get her INR check per rich. Orders in

## 2020-04-22 ENCOUNTER — Ambulatory Visit: Payer: Self-pay

## 2020-04-29 ENCOUNTER — Other Ambulatory Visit: Payer: Self-pay

## 2020-04-29 ENCOUNTER — Ambulatory Visit (INDEPENDENT_AMBULATORY_CARE_PROVIDER_SITE_OTHER): Payer: Medicare Other | Admitting: Registered Nurse

## 2020-04-29 DIAGNOSIS — I4891 Unspecified atrial fibrillation: Secondary | ICD-10-CM

## 2020-04-29 DIAGNOSIS — N184 Chronic kidney disease, stage 4 (severe): Secondary | ICD-10-CM | POA: Diagnosis not present

## 2020-04-29 DIAGNOSIS — Z7901 Long term (current) use of anticoagulants: Secondary | ICD-10-CM | POA: Diagnosis not present

## 2020-04-30 ENCOUNTER — Telehealth: Payer: Self-pay | Admitting: Registered Nurse

## 2020-04-30 LAB — BASIC METABOLIC PANEL
BUN/Creatinine Ratio: 11 — ABNORMAL LOW (ref 12–28)
BUN: 59 mg/dL — ABNORMAL HIGH (ref 8–27)
CO2: 19 mmol/L — ABNORMAL LOW (ref 20–29)
Calcium: 8.6 mg/dL — ABNORMAL LOW (ref 8.7–10.3)
Chloride: 108 mmol/L — ABNORMAL HIGH (ref 96–106)
Creatinine, Ser: 5.38 mg/dL (ref 0.57–1.00)
GFR calc Af Amer: 9 mL/min/{1.73_m2} — ABNORMAL LOW (ref >59)
GFR calc non Af Amer: 8 mL/min/{1.73_m2} — ABNORMAL LOW (ref >59)
Glucose: 86 mg/dL (ref 65–99)
Potassium: 4.2 mmol/L (ref 3.5–5.2)
Sodium: 144 mmol/L (ref 134–144)

## 2020-04-30 LAB — PROTIME-INR
INR: 1.6 — ABNORMAL HIGH (ref 0.9–1.2)
Prothrombin Time: 16.5 s — ABNORMAL HIGH (ref 9.1–12.0)

## 2020-04-30 NOTE — Telephone Encounter (Signed)
Called pt to tell her to come back into the office for blood draw to add a CBC. There was not enough blood to add on from previous visit. Pt also stated that she had a visit with nephrology about couple of weeks ago. She did not state when her follow up appt with them was. Pt states that she is not having any symptoms or concerns at this time.

## 2020-04-30 NOTE — Telephone Encounter (Signed)
Received a fax after hours for a critical lab results. [facility] stated  Creatinine 5.38 (0.57-1.00) drawn on 04/29/20 Last value was 01/05/20 and it was 3.78 Tammy's number is [phone] Please advise.

## 2020-04-30 NOTE — Telephone Encounter (Signed)
Please see message below stating critical lab results.

## 2020-05-13 ENCOUNTER — Other Ambulatory Visit: Payer: Self-pay

## 2020-05-13 ENCOUNTER — Encounter: Payer: Self-pay | Admitting: Registered Nurse

## 2020-05-13 ENCOUNTER — Ambulatory Visit (INDEPENDENT_AMBULATORY_CARE_PROVIDER_SITE_OTHER): Payer: Medicare Other | Admitting: Registered Nurse

## 2020-05-13 VITALS — BP 176/74 | HR 58 | Temp 97.5°F | Ht 63.0 in | Wt 103.0 lb

## 2020-05-13 DIAGNOSIS — Z7689 Persons encountering health services in other specified circumstances: Secondary | ICD-10-CM

## 2020-05-13 DIAGNOSIS — Z8673 Personal history of transient ischemic attack (TIA), and cerebral infarction without residual deficits: Secondary | ICD-10-CM | POA: Diagnosis not present

## 2020-05-13 DIAGNOSIS — M545 Low back pain, unspecified: Secondary | ICD-10-CM

## 2020-05-13 DIAGNOSIS — Z7901 Long term (current) use of anticoagulants: Secondary | ICD-10-CM

## 2020-05-13 DIAGNOSIS — N185 Chronic kidney disease, stage 5: Secondary | ICD-10-CM

## 2020-05-13 DIAGNOSIS — I4891 Unspecified atrial fibrillation: Secondary | ICD-10-CM | POA: Diagnosis not present

## 2020-05-13 DIAGNOSIS — G8929 Other chronic pain: Secondary | ICD-10-CM

## 2020-05-13 DIAGNOSIS — D631 Anemia in chronic kidney disease: Secondary | ICD-10-CM | POA: Diagnosis not present

## 2020-05-13 NOTE — Patient Instructions (Signed)
° ° ° °  If you have lab work done today you will be contacted with your lab results within the next 2 weeks.  If you have not heard from us then please contact us. The fastest way to get your results is to register for My Chart. ° ° °IF you received an x-ray today, you will receive an invoice from [facility]. Please contact [facility] at [phone] with questions or concerns regarding your invoice.  ° °IF you received labwork today, you will receive an invoice from [facility]. Please contact [facility] at [phone] with questions or concerns regarding your invoice.  ° °Our billing staff will not be able to assist you with questions regarding bills from these companies. ° °You will be contacted with the lab results as soon as they are available. The fastest way to get your results is to activate your My Chart account. Instructions are located on the last page of this paperwork. If you have not heard from us regarding the results in 2 weeks, please contact this office. °  ° ° ° °

## 2020-05-13 NOTE — Progress Notes (Signed)
Established Patient Office Visit  Subjective:  Patient ID: Monique Henderson, female    DOB: [birth year]  Age: 69 y.o. MRN: [id number]  CC:  Chief Complaint  Patient presents with   Transitions Of Care    HPI Monique Henderson presents for visit to est care  Former Dr. Pamella Henderson pt  Histories reviewed - no updates at this time. Pt has substantial CV history for which she has been followed by cardiology Dr. Tamala Henderson. Fortunately she is on a "follow up as needed" basis at this time. No new CV complaints.  Hx of breast Ca x2, radiation and treatment x2, no new concerns.  History of renal disease. For a time got bad enough that she was on dialysis. No longer on dialysis as her function had improved. However, we did draw labs 2 weeks ago that showed GFR in single digits and Cr over 5. Pt requesting repeat today. No symptoms indicative of these changes.  Hx of Afib, on coumadin anticoag therapy 6mg  daily. Last PT INR showed subtherapeutic effect. Will repeat today before adjusting dose per pt request. Pt acknowledges risks, benefits, and side effects of this choice.   Lastly, patient reports an unexpected weight loss of a few pounds in previous months. Decreased appetite. No change to exercise. Diet is about the same but portions a little smaller. No other symptoms of note at this time.   Past Medical History:  Diagnosis Date   AF (paroxysmal atrial fibrillation) ([place]) 05/29/2019   Aortic atherosclerosis ([place]) 07/05/2019   Aortic dissection ([facility]) 04/04/2019   Atelectasis 2002   Bilateral   Atrial fibrillation with RVR ([facility]) 05/18/2019   Atypical chest pain 07/05/2019   Bone spur 2008   Right calcaneal foot spur   Breast cancer ([facility]) 2004   Ductal carcinoma in situ of the left breast; S/P left partial mastectomy 02/26/2003; S/P re-excision of cranial and lateral margins11/18/2004.radiation   Breast cancer ([facility]) 09/21/2012   right breast/ last radiation treatment 03/22/2013    Cerebral thrombosis with cerebral infarction 05/22/2019   Chronic kidney disease, stage IV (severe) ([facility]) 10/10/2007   Chronic low back pain 06/22/2016   Chronic obstructive lung disease ([name]) 01/16/2017   CKD (chronic kidney disease), stage IV ([name]) 07/19/2017   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 Autumn Messing; S/P re-excision of superior and inferior margins 10/27/2012.    Dissection of aorta ([place]) 04/03/2019   Elevated troponin 08/28/2019   ESRD on dialysis ([facility]) 05/29/2019   Essential hypertension 09/16/2006   GERD 09/16/2006   GERD (gastroesophageal reflux disease)    H/O: stroke 05/29/2019   Hepatitis C    treated and RNA confirmed not detectable 01/2017   Hot flashes    Hx of radiation therapy 2005   left breast   Hx of radiation therapy 01/11/13- 03/22/13   right breast 5760 cGy 30 sessions   Hyperkalemia 08/28/2019   Hypertension    Insomnia 03/14/2015   Low back pain    Lumbar spinal stenosis    S/P lumbar decompressive laminectomy, fusion and plating for lumbar spinal stensosis   Malnutrition of moderate degree 05/19/2019   Non compliance with medical treatment 12/04/2017   Normocytic anemia    With thrombocytosis   On Coumadin for atrial fibrillation ([facility]) 10/03/2019   Osteoarthritis    Paroxysmal SVT (supraventricular tachycardia) ([place]) 08/27/2019   Personal history of radiation therapy    Pleural effusion 05/18/2019   Positive D  dimer 08/28/2019   Right ureteral stone 2002   S/P aortic aneurysm repair 04/07/2019   S/P lumbar spinal fusion 01/18/2014   S/P lumbar decompressive laminectomy, fusion, and plating for lumbar spinal stenosis on 05/27/2009 by Dr. Eustace Moore.  S/P anterolateral retroperitoneal interbody fusion L2-3 utilizing a 8 mm peek interbody cage packed with morcellized allograft, and anterior lumbar plating L2-3 for recurrent disc herniation L2-3 with spinal stenosis on  01/18/2014 by Dr. Eustace Moore.     Shortness of breath    from pain   SIRS (systemic inflammatory response syndrome) ([facility]) 08/27/2019   SVT (supraventricular tachycardia) ([facility]) 08/28/2019   Tobacco use disorder 04/19/2009   Uterine fibroid    Wears dentures    top    Past Surgical History:  Procedure Laterality Date   ANTERIOR LAT LUMBAR FUSION N/A 01/18/2014   Procedure: ANTERIOR LATERAL LUMBAR FUSION LUMBAR TWO-THREE;  Surgeon: Eustace Moore, MD;  Location: [facility];  Service: Neurosurgery;  Laterality: N/A;   ANTERIOR LUMBAR FUSION  01/18/2014   AV FISTULA PLACEMENT Left 04/20/2019   Procedure: ARTERIOVENOUS (AV) FISTULA CREATION;  Surgeon: [name], MD;  Location: [facility];  Service: Vascular;  Laterality: Left;   BACK SURGERY     BREAST LUMPECTOMY Left 01/2003   BREAST LUMPECTOMY Right 2014   BREAST LUMPECTOMY WITH NEEDLE LOCALIZATION AND AXILLARY SENTINEL LYMPH NODE BX Right 10/13/2012   Procedure: BREAST LUMPECTOMY WITH NEEDLE LOCALIZATION;  Surgeon: [name], MD;  Location: [facility];  Service: General;  Laterality: Right;  Right breast wire localized lumpectomy   INSERTION OF DIALYSIS CATHETER Right 04/20/2019   Procedure: INSERTION OF DIALYSIS CATHETER, right internal jugular;  Surgeon: [name], MD;  Location: [facility];  Service: Vascular;  Laterality: Right;   IR THORACENTESIS ASP PLEURAL SPACE W/IMG GUIDE  05/19/2019   LAMINECTOMY  05/27/2009   Lumbar decompressive laminectomy, fusion and plating for lumbar spinal stensosis   LUMBAR LAMINECTOMY/DECOMPRESSION MICRODISCECTOMY Left 03/23/2013   Procedure: LUMBAR LAMINECTOMY/DECOMPRESSION MICRODISCECTOMY 1 LEVEL;  Surgeon: Eustace Moore, MD;  Location: [facility];  Service: Neurosurgery;  Laterality: Left;  LUMBAR LAMINECTOMY/DECOMPRESSION MICRODISCECTOMY 1 LEVEL   MASTECTOMY, PARTIAL Left 02/26/2003   ; S/P re-excision of cranial and lateral margins  04/19/2003.    RE-EXCISION OF BREAST CANCER,SUPERIOR MARGINS Right 10/27/2012   Procedure: RE-EXCISION OF BREAST CANCER,SUPERIOR and inferior MARGINS;  Surgeon: [name], MD;  Location: [facility];  Service: General;  Laterality: Right;   RE-EXCISION OF BREAST LUMPECTOMY Left 04/2003   TEE WITHOUT CARDIOVERSION N/A 04/04/2019   Procedure: Transesophageal Echocardiogram (Tee);  Surgeon: Wonda Olds, MD;  Location: [facility];  Service: Open Heart Surgery;  Laterality: N/A;   THORACIC AORTIC ANEURYSM REPAIR N/A 04/04/2019   Procedure: THORACIC ASCENDING ANEURYSM REPAIR (AAA)  USING 28 MM X 30 CM HEMASHIELD PLATINUM VASCULAR GRAFT;  Surgeon: Wonda Olds, MD;  Location: [facility];  Service: Open Heart Surgery;  Laterality: N/A;    Family History  Problem Relation Age of Onset   Colon cancer Mother 2   Hypertension Mother    Diabetes Sister 66   Hypertension Sister    Diabetes Brother    Hypertension Brother    Diabetes Brother    Hypertension Brother    Kidney disease Son        s/p renal transplant   Hypertension Son    Diabetes Son    Multiple sclerosis Son    Bone  cancer Sister 37   Breast cancer Neg Hx    Cervical cancer Neg Hx     Social History   Socioeconomic History   Marital status: Single    Spouse name: Not on file   Number of children: 2   Years of education: Not on file   Highest education level: Not on file  Occupational History   Not on file  Tobacco Use   Smoking status: Former Smoker    Packs/day: 0.25    Years: 44.00    Pack years: 11.00    Types: Cigarettes    Quit date: 05/24/2019    Years since quitting: 0.9   Smokeless tobacco: Never Used   Tobacco comment: smoking less  Vaping Use   Vaping Use: Never used  Substance and Sexual Activity   Alcohol use: Not Currently    Alcohol/week: 2.0 standard drinks    Types: 2 Cans of beer per week    Comment:  "couple beers a weekend"   Drug use: Not Currently    Types:  Cocaine    Comment: "stopped back in the 1980's"   Sexual activity: Yes    Birth control/protection: Post-menopausal  Other Topics Concern   Not on file  Social History Narrative   Not on file   Social Determinants of Health   Financial Resource Strain: Not on file  Food Insecurity: Not on file  Transportation Needs: No Transportation Needs   Lack of Transportation (Medical): No   Lack of Transportation (Non-Medical): No  Physical Activity: Not on file  Stress: Not on file  Social Connections: Not on file  Intimate Partner Violence: Not on file    Outpatient Medications Prior to Visit  Medication Sig Dispense Refill   albuterol (VENTOLIN HFA) 108 (90 Base) MCG/ACT inhaler Inhale 2 puffs into the lungs every 6 (six) hours as needed for wheezing or shortness of breath. 8.5 g 0   amLODipine (NORVASC) 10 MG tablet TAKE 1 TABLET (10 MG TOTAL) BY MOUTH DAILY. 90 tablet 0   cloNIDine (CATAPRES - DOSED IN MG/24 HR) 0.3 mg/24hr patch Place 1 patch (0.3 mg total) onto the skin once a week. 4 patch 2   furosemide (LASIX) 20 MG tablet Take 1 tablet (20 mg total) by mouth daily. 60 tablet 5   hydrALAZINE (APRESOLINE) 25 MG tablet Take 1 tablet (25 mg total) by mouth 2 (two) times daily.     losartan (COZAAR) 100 MG tablet Take 100 mg by mouth at bedtime.     metoprolol succinate (TOPROL-XL) 50 MG 24 hr tablet Take 1 tablet (50 mg total) by mouth daily. Take with or immediately following a meal. 90 tablet 1   mirtazapine (REMERON) 7.5 MG tablet TAKE 1 TABLET (7.5 MG TOTAL) BY MOUTH AT BEDTIME. 30 tablet 2   ondansetron (ZOFRAN) 4 MG tablet Take 1 tablet (4 mg total) by mouth every 8 (eight) hours as needed for nausea or vomiting. 20 tablet 3   oxyCODONE-acetaminophen (PERCOCET/ROXICET) 5-325 MG tablet Take 1 tablet by mouth 3 (three) times daily as needed for severe pain (pain.).     warfarin (COUMADIN) 6 MG tablet TAKE 1 TABLET (6 MG TOTAL) BY MOUTH DAILY AT 4 PM. 30 tablet 0    rosuvastatin (CRESTOR) 10 MG tablet TAKE 1 TABLET (10 MG TOTAL) BY MOUTH DAILY. 30 tablet 3   No facility-administered medications prior to visit.    Allergies  Allergen Reactions   Shrimp [Shellfish Allergy] Shortness Of Breath   Bactroban [Mupirocin]  Other (See Comments)    "Sores in nose"   Vicodin [Hydrocodone-Acetaminophen] Itching and Nausea And Vomiting    This is patient's home medication   Eliquis [Apixaban] Rash   Lisinopril Cough   Tylenol [Acetaminophen] Itching    ROS Review of Systems  Constitutional: Positive for unexpected weight change. Negative for activity change, appetite change, chills, diaphoresis, fatigue and fever.  HENT: Negative.   Eyes: Negative.   Respiratory: Negative.   Cardiovascular: Negative.   Gastrointestinal: Negative.   Endocrine: Negative.   Genitourinary: Negative.   Musculoskeletal: Positive for back pain (s/p laminectomy, chronic, stable). Negative for arthralgias, gait problem, joint swelling, myalgias, neck pain and neck stiffness.  Skin: Negative.   Allergic/Immunologic: Negative.   Neurological: Negative.   Hematological: Negative.   Psychiatric/Behavioral: Negative.   All other systems reviewed and are negative.     Objective:    Physical Exam Vitals and nursing note reviewed.  Constitutional:      General: She is not in acute distress.    Appearance: Normal appearance. She is normal weight. She is not ill-appearing, toxic-appearing or diaphoretic.  Cardiovascular:     Rate and Rhythm: Normal rate and regular rhythm.     Heart sounds: Normal heart sounds. No murmur heard. No friction rub. No gallop.   Pulmonary:     Effort: Pulmonary effort is normal. No respiratory distress.     Breath sounds: Normal breath sounds. No stridor. No wheezing, rhonchi or rales.  Chest:     Chest wall: No tenderness.  Skin:    General: Skin is warm and dry.  Neurological:     General: No focal deficit present.     Mental Status:  She is alert and oriented to person, place, and time. Mental status is at baseline.  Psychiatric:        Mood and Affect: Mood normal.        Behavior: Behavior normal.        Thought Content: Thought content normal.        Judgment: Judgment normal.     BP (!) 176/74    Pulse (!) 58    Temp (!) 97.5 F (36.4 C)    Ht 5\' 3"  (1.6 m)    Wt 103 lb (46.7 kg)    SpO2 100%    BMI 18.25 kg/m  Wt Readings from Last 3 Encounters:  05/13/20 103 lb (46.7 kg)  01/05/20 103 lb (46.7 kg)  11/30/19 109 lb 12.8 oz (49.8 kg)     Health Maintenance Due  Topic Date Due   COVID-19 Vaccine (1) Never done   TETANUS/TDAP  Never done   PNA vac Low Risk Adult (2 of 2 - PPSV23) 12/23/2018   INFLUENZA VACCINE  12/31/2019    There are no preventive care reminders to display for this patient.  Lab Results  Component Value Date   TSH 1.396 08/27/2019   Lab Results  Component Value Date   WBC 6.8 01/05/2020   HGB 7.4 (L) 01/05/2020   HCT 22.9 (L) 01/05/2020   MCV 94 01/05/2020   PLT 339 01/05/2020   Lab Results  Component Value Date   NA 144 04/29/2020   K 4.2 04/29/2020   CO2 19 (L) 04/29/2020   GLUCOSE 86 04/29/2020   BUN 59 (H) 04/29/2020   CREATININE 5.38 (HH) 04/29/2020   BILITOT 0.3 01/05/2020   ALKPHOS 95 01/05/2020   AST 19 01/05/2020   ALT 15 01/05/2020   PROT 7.1 01/05/2020  ALBUMIN 4.6 01/05/2020   CALCIUM 8.6 (L) 04/29/2020   ANIONGAP 12 08/30/2019   Lab Results  Component Value Date   CHOL 125 01/05/2020   Lab Results  Component Value Date   HDL 59 01/05/2020   Lab Results  Component Value Date   LDLCALC 50 01/05/2020   Lab Results  Component Value Date   TRIG 82 01/05/2020   Lab Results  Component Value Date   CHOLHDL 2.1 01/05/2020   Lab Results  Component Value Date   HGBA1C 6.0 (H) 04/04/2019      Assessment & Plan:   Problem List Items Addressed This Visit      Cardiovascular and Mediastinum   On Coumadin for atrial fibrillation ([place])      Other   Chronic low back pain   H/O: stroke    Other Visit Diagnoses    Anemia due to stage 5 chronic kidney disease, not on chronic dialysis ([place])    -  Primary   Relevant Orders   CBC With Differential   Comprehensive metabolic panel   Hemoglobin A1c   Lipid panel   Protime-INR   Iron, TIBC and Ferritin Panel   Ambulatory referral to Nephrology   Encounter to establish care          No orders of the defined types were placed in this encounter.   Follow-up: No follow-ups on file.   PLAN  Labs collected. Will follow up with the patient as warranted.  Pt voices that she is strongly against resuming dialysis. Discussed quality of life vs. Life prolonging treatment with patient. Will leave this an ongoing dialogue. Should reestablish with nephrology regardless to assist in managing care. Will explore pall care and hospice referrals in future at appropriate time.   Will likely need coumadin adjusted based on pt inr results  Pain is being adequately managed. Will continue current course.  Weight loss is likely multifactorial - chronic opioids likely decreasing appetite, ESRD playing a role, concern for underlying malignancy given her hx of breast ca x 2. Will have patient increase caloric intake with boost / ensure. Will monitor for any changes and we can address as they arise. Pt would prefer conservative approach at this time. Understands r/b/se of this approach.  Will plan follow up with patient based on lab results  Patient encouraged to call clinic with any questions, comments, or concerns.  Maximiano Coss, NP

## 2020-05-14 ENCOUNTER — Other Ambulatory Visit: Payer: Self-pay | Admitting: Registered Nurse

## 2020-05-14 ENCOUNTER — Telehealth: Payer: Self-pay | Admitting: Registered Nurse

## 2020-05-14 DIAGNOSIS — I4891 Unspecified atrial fibrillation: Secondary | ICD-10-CM

## 2020-05-14 LAB — LIPID PANEL
Chol/HDL Ratio: 4.3 ratio (ref 0.0–4.4)
Cholesterol, Total: 167 mg/dL (ref 100–199)
HDL: 39 mg/dL — ABNORMAL LOW (ref >39)
LDL Chol Calc (NIH): 100 mg/dL — ABNORMAL HIGH (ref 0–99)
Triglycerides: 162 mg/dL — ABNORMAL HIGH (ref 0–149)
VLDL Cholesterol Cal: 28 mg/dL (ref 5–40)

## 2020-05-14 LAB — CBC WITH DIFFERENTIAL
Basophils Absolute: 0.1 10*3/uL (ref 0.0–0.2)
Basos: 1 %
EOS (ABSOLUTE): 0.1 10*3/uL (ref 0.0–0.4)
Eos: 2 %
Hematocrit: 25.2 % — ABNORMAL LOW (ref 34.0–46.6)
Hemoglobin: 8.1 g/dL — ABNORMAL LOW (ref 11.1–15.9)
Immature Grans (Abs): 0 10*3/uL (ref 0.0–0.1)
Immature Granulocytes: 0 %
Lymphocytes Absolute: 1 10*3/uL (ref 0.7–3.1)
Lymphs: 19 %
MCH: 28.1 pg (ref 26.6–33.0)
MCHC: 32.1 g/dL (ref 31.5–35.7)
MCV: 88 fL (ref 79–97)
Monocytes Absolute: 0.4 10*3/uL (ref 0.1–0.9)
Monocytes: 8 %
Neutrophils Absolute: 3.8 10*3/uL (ref 1.4–7.0)
Neutrophils: 70 %
RBC: 2.88 x10E6/uL — ABNORMAL LOW (ref 3.77–5.28)
RDW: 15.8 % — ABNORMAL HIGH (ref 11.7–15.4)
WBC: 5.4 10*3/uL (ref 3.4–10.8)

## 2020-05-14 LAB — COMPREHENSIVE METABOLIC PANEL
ALT: 16 IU/L (ref 0–32)
AST: 15 IU/L (ref 0–40)
Albumin/Globulin Ratio: 1.4 (ref 1.2–2.2)
Albumin: 4 g/dL (ref 3.8–4.8)
Alkaline Phosphatase: 119 IU/L (ref 44–121)
BUN/Creatinine Ratio: 8 — ABNORMAL LOW (ref 12–28)
BUN: 47 mg/dL — ABNORMAL HIGH (ref 8–27)
Bilirubin Total: 0.2 mg/dL (ref 0.0–1.2)
CO2: 21 mmol/L (ref 20–29)
Calcium: 8.8 mg/dL (ref 8.7–10.3)
Chloride: 101 mmol/L (ref 96–106)
Creatinine, Ser: 5.82 mg/dL (ref 0.57–1.00)
GFR calc Af Amer: 8 mL/min/{1.73_m2} — ABNORMAL LOW (ref >59)
GFR calc non Af Amer: 7 mL/min/{1.73_m2} — ABNORMAL LOW (ref >59)
Globulin, Total: 2.9 g/dL (ref 1.5–4.5)
Glucose: 110 mg/dL — ABNORMAL HIGH (ref 65–99)
Potassium: 4.4 mmol/L (ref 3.5–5.2)
Sodium: 140 mmol/L (ref 134–144)
Total Protein: 6.9 g/dL (ref 6.0–8.5)

## 2020-05-14 LAB — IRON,TIBC AND FERRITIN PANEL
Ferritin: 262 ng/mL — ABNORMAL HIGH (ref 15–150)
Iron Saturation: 21 % (ref 15–55)
Iron: 45 ug/dL (ref 27–139)
Total Iron Binding Capacity: 215 ug/dL — ABNORMAL LOW (ref 250–450)
UIBC: 170 ug/dL (ref 118–369)

## 2020-05-14 LAB — PROTIME-INR
INR: 1.4 — ABNORMAL HIGH (ref 0.9–1.2)
Prothrombin Time: 14.8 s — ABNORMAL HIGH (ref 9.1–12.0)

## 2020-05-14 LAB — HEMOGLOBIN A1C
Est. average glucose Bld gHb Est-mCnc: 111 mg/dL
Hgb A1c MFr Bld: 5.5 % (ref 4.8–5.6)

## 2020-05-14 MED ORDER — WARFARIN SODIUM 7.5 MG PO TABS: 7.5000 mg | ORAL_TABLET | ORAL | 0 refills | Status: DC

## 2020-05-14 MED ORDER — WARFARIN SODIUM 6 MG PO TABS: 6.0000 mg | ORAL_TABLET | ORAL | 0 refills | Status: DC

## 2020-05-14 NOTE — Progress Notes (Signed)
If we could call Monique Henderson -  Kidneys still poor, as expected.   Seeing clotting numbers out of ideal range - instead of doing 6mg  warfarin daily, we will switch to 6mg  four times a week and 7.5mg  three times a week on alternating days.  We should recheck this within 1 month. Can be lab only if pt desires.  Thank you  Rich

## 2020-05-14 NOTE — Telephone Encounter (Signed)
Recieved a after hours fax from [employer] [facility] regarding a critical lab result. Elevated creatine of 5.82 drawn on 12/13. [facility] tech was Zenia Resides [phone] Lab reference number is [id number]. Please advise.

## 2020-05-14 NOTE — Telephone Encounter (Signed)
Provider was informed. Lab has been reviewed and msg was given to patient

## 2020-05-15 DIAGNOSIS — Z79899 Other long term (current) drug therapy: Secondary | ICD-10-CM | POA: Diagnosis not present

## 2020-05-15 DIAGNOSIS — M5136 Other intervertebral disc degeneration, lumbar region: Secondary | ICD-10-CM | POA: Diagnosis not present

## 2020-05-15 DIAGNOSIS — G894 Chronic pain syndrome: Secondary | ICD-10-CM | POA: Diagnosis not present

## 2020-06-14 DIAGNOSIS — M5136 Other intervertebral disc degeneration, lumbar region: Secondary | ICD-10-CM | POA: Diagnosis not present

## 2020-06-14 DIAGNOSIS — Z79899 Other long term (current) drug therapy: Secondary | ICD-10-CM | POA: Diagnosis not present

## 2020-06-14 DIAGNOSIS — G894 Chronic pain syndrome: Secondary | ICD-10-CM | POA: Diagnosis not present

## 2020-07-03 ENCOUNTER — Other Ambulatory Visit: Payer: Self-pay | Admitting: Cardiology

## 2020-07-03 ENCOUNTER — Other Ambulatory Visit: Payer: Self-pay | Admitting: Registered Nurse

## 2020-07-03 DIAGNOSIS — I4891 Unspecified atrial fibrillation: Secondary | ICD-10-CM

## 2020-07-03 DIAGNOSIS — I700 Atherosclerosis of aorta: Secondary | ICD-10-CM

## 2020-07-03 DIAGNOSIS — Z8673 Personal history of transient ischemic attack (TIA), and cerebral infarction without residual deficits: Secondary | ICD-10-CM

## 2020-07-03 DIAGNOSIS — Z7901 Long term (current) use of anticoagulants: Secondary | ICD-10-CM

## 2020-07-03 NOTE — Telephone Encounter (Signed)
Requested medication (s) are due for refill today: no  Requested medication (s) are on the active medication list: yes  Last refill:  05/14/2020  Future visit scheduled: no  Notes to clinic:  this refill cannot be delegated   Requested Prescriptions  Pending Prescriptions Disp Refills   warfarin (COUMADIN) 6 MG tablet [Pharmacy Med Name: WARFARIN SODIUM 6 MG ORAL TABLET] 30 tablet 0    Sig: Take 1 tablet (6 mg total) by mouth 4 (four) times a week.      Hematology:  Anticoagulants - warfarin Failed - 07/03/2020 10:00 AM      Failed - This refill cannot be delegated      Failed - If the patient is managed by Coumadin Clinic - route to their Pool. If not, forward to the provider.      Failed - INR in normal range and within 30 days    INR  Date Value Ref Range Status  05/13/2020 1.4 (H) 0.9 - 1.2 Final    Comment:    Reference interval is for non-anticoagulated patients. Suggested INR therapeutic range for Vitamin K antagonist therapy:    Standard Dose (moderate intensity                   therapeutic range):       2.0 - 3.0    Higher intensity therapeutic range       2.5 - 3.5           Passed - Valid encounter within last 3 months    Recent Outpatient Visits           1 month ago Anemia due to stage 5 chronic kidney disease, not on chronic dialysis [facility])   [facility] at [place], NP   6 months ago On Coumadin for atrial fibrillation [facility])   Primary Care at [facility], Lilia Argue, MD   7 months ago Right-sided chest wall pain   Primary Care at [facility], Fenton Malling, MD   8 months ago Essential hypertension   Primary Care at [facility], Lilia Argue, MD   9 months ago AF (paroxysmal atrial fibrillation) [facility])   Primary Care at [facility], Lilia Argue, MD

## 2020-07-03 NOTE — Telephone Encounter (Signed)
Patient is requesting a refill of the following medications: Requested Prescriptions   Pending Prescriptions Disp Refills   warfarin (COUMADIN) 6 MG tablet [Pharmacy Med Name: WARFARIN SODIUM 6 MG ORAL TABLET] 30 tablet 0    Sig: Take 1 tablet (6 mg total) by mouth 4 (four) times a week.    Date of patient request: 07/03/2020 Last office visit: 05/13/2020 Date of last refill: 05/14/2020 Last refill amount: 9 tablets no refills

## 2020-07-15 ENCOUNTER — Telehealth: Payer: Self-pay | Admitting: Registered Nurse

## 2020-07-15 ENCOUNTER — Other Ambulatory Visit: Payer: Self-pay | Admitting: Registered Nurse

## 2020-07-15 DIAGNOSIS — G479 Sleep disorder, unspecified: Secondary | ICD-10-CM

## 2020-07-15 DIAGNOSIS — Z79899 Other long term (current) drug therapy: Secondary | ICD-10-CM | POA: Diagnosis not present

## 2020-07-15 DIAGNOSIS — G894 Chronic pain syndrome: Secondary | ICD-10-CM | POA: Diagnosis not present

## 2020-07-15 DIAGNOSIS — M5136 Other intervertebral disc degeneration, lumbar region: Secondary | ICD-10-CM | POA: Diagnosis not present

## 2020-07-15 MED ORDER — MIRTAZAPINE 7.5 MG PO TABS: 7.5000 mg | ORAL_TABLET | Freq: Every day | ORAL | 3 refills | Status: DC

## 2020-07-15 NOTE — Telephone Encounter (Signed)
07/15/2020 - PATIENT STATES [facility] TOLD HER THAT THEY HAVE BEEN SENDING FAXES HERE FOR 2 WEEKS AND HAVE NOT GOTTEN A RESPONSE BACK. THE PATIENT STATES SHE NEEDS RICH TO REFILL HER SLEEPING PILLS AS SOON AS POSSIBLE. BEST PHONE [phone] (CELL) [place]

## 2020-07-15 NOTE — Telephone Encounter (Signed)
Mirtazapine usually used for sleep.  Have sent fills  Thanks,  Denice Paradise

## 2020-07-15 NOTE — Telephone Encounter (Signed)
Pt requesting refill sleeping meds I do not see any please advise

## 2020-07-30 ENCOUNTER — Telehealth: Payer: Self-pay | Admitting: Registered Nurse

## 2020-07-30 ENCOUNTER — Other Ambulatory Visit: Payer: Self-pay

## 2020-07-30 DIAGNOSIS — J449 Chronic obstructive pulmonary disease, unspecified: Secondary | ICD-10-CM

## 2020-07-30 MED ORDER — ALBUTEROL SULFATE HFA 108 (90 BASE) MCG/ACT IN AERS: 2.0000 | INHALATION_SPRAY | Freq: Four times a day (QID) | RESPIRATORY_TRACT | 0 refills | Status: DC | PRN

## 2020-07-30 NOTE — Telephone Encounter (Signed)
Sent!

## 2020-07-30 NOTE — Telephone Encounter (Signed)
Patient is calling in because she needs her inhaler asap    albuterol (VENTOLIN HFA) 108 (90 Base) MCG/ACT inhaler [[id number]]   Please send to   [place], [place] - [address]  [place], [place] [address]  Phone:  [phone] Fax:  [phone]

## 2020-08-05 ENCOUNTER — Ambulatory Visit: Payer: Medicare Other | Admitting: Registered Nurse

## 2020-08-06 ENCOUNTER — Telehealth: Payer: Self-pay | Admitting: Registered Nurse

## 2020-08-06 NOTE — Telephone Encounter (Signed)
If pt 08/13/2020 appt can be moved up sooner please cannot put referral in without relevant appointment

## 2020-08-06 NOTE — Telephone Encounter (Signed)
Patient has discharge coming from [facility] breast  And would like a referral to the breast care center

## 2020-08-07 NOTE — Telephone Encounter (Signed)
Called pt. And attempted to reschedule. Pt. Refused to move appt.

## 2020-08-12 DIAGNOSIS — M5136 Other intervertebral disc degeneration, lumbar region: Secondary | ICD-10-CM | POA: Diagnosis not present

## 2020-08-12 DIAGNOSIS — G894 Chronic pain syndrome: Secondary | ICD-10-CM | POA: Diagnosis not present

## 2020-08-12 DIAGNOSIS — Z79899 Other long term (current) drug therapy: Secondary | ICD-10-CM | POA: Diagnosis not present

## 2020-08-13 ENCOUNTER — Ambulatory Visit: Payer: Medicare Other | Admitting: Registered Nurse

## 2020-09-04 ENCOUNTER — Other Ambulatory Visit: Payer: Self-pay | Admitting: Family Medicine

## 2020-09-04 DIAGNOSIS — Z79899 Other long term (current) drug therapy: Secondary | ICD-10-CM | POA: Diagnosis not present

## 2020-09-04 DIAGNOSIS — M5136 Other intervertebral disc degeneration, lumbar region: Secondary | ICD-10-CM | POA: Diagnosis not present

## 2020-09-04 DIAGNOSIS — G894 Chronic pain syndrome: Secondary | ICD-10-CM | POA: Diagnosis not present

## 2020-09-04 DIAGNOSIS — Z9181 History of falling: Secondary | ICD-10-CM | POA: Diagnosis not present

## 2020-09-04 DIAGNOSIS — Z1231 Encounter for screening mammogram for malignant neoplasm of breast: Secondary | ICD-10-CM

## 2020-09-04 DIAGNOSIS — Z681 Body mass index (BMI) 19 or less, adult: Secondary | ICD-10-CM | POA: Diagnosis not present

## 2020-09-05 ENCOUNTER — Encounter: Payer: Self-pay | Admitting: Registered Nurse

## 2020-09-18 DIAGNOSIS — G894 Chronic pain syndrome: Secondary | ICD-10-CM | POA: Diagnosis not present

## 2020-09-18 DIAGNOSIS — M5136 Other intervertebral disc degeneration, lumbar region: Secondary | ICD-10-CM | POA: Diagnosis not present

## 2020-09-18 DIAGNOSIS — Z9181 History of falling: Secondary | ICD-10-CM | POA: Diagnosis not present

## 2020-09-18 DIAGNOSIS — R079 Chest pain, unspecified: Secondary | ICD-10-CM | POA: Diagnosis not present

## 2020-09-18 DIAGNOSIS — R059 Cough, unspecified: Secondary | ICD-10-CM | POA: Diagnosis not present

## 2020-09-18 DIAGNOSIS — Z681 Body mass index (BMI) 19 or less, adult: Secondary | ICD-10-CM | POA: Diagnosis not present

## 2020-09-18 DIAGNOSIS — Z79899 Other long term (current) drug therapy: Secondary | ICD-10-CM | POA: Diagnosis not present

## 2020-09-21 ENCOUNTER — Other Ambulatory Visit: Payer: Self-pay

## 2020-09-21 ENCOUNTER — Encounter (HOSPITAL_COMMUNITY): Payer: Self-pay | Admitting: *Deleted

## 2020-09-21 ENCOUNTER — Emergency Department (HOSPITAL_COMMUNITY): Payer: Medicare Other

## 2020-09-21 ENCOUNTER — Inpatient Hospital Stay (HOSPITAL_COMMUNITY)
Admission: EM | Admit: 2020-09-21 | Discharge: 2020-09-27 | DRG: 673 | Disposition: A | Discharge status: Home Health | Payer: Medicare Other | Attending: Internal Medicine | Admitting: Internal Medicine

## 2020-09-21 DIAGNOSIS — N189 Chronic kidney disease, unspecified: Secondary | ICD-10-CM | POA: Diagnosis not present

## 2020-09-21 DIAGNOSIS — Z681 Body mass index (BMI) 19 or less, adult: Secondary | ICD-10-CM

## 2020-09-21 DIAGNOSIS — N19 Unspecified kidney failure: Secondary | ICD-10-CM | POA: Diagnosis not present

## 2020-09-21 DIAGNOSIS — Z853 Personal history of malignant neoplasm of breast: Secondary | ICD-10-CM

## 2020-09-21 DIAGNOSIS — N186 End stage renal disease: Secondary | ICD-10-CM | POA: Diagnosis present

## 2020-09-21 DIAGNOSIS — I161 Hypertensive emergency: Secondary | ICD-10-CM | POA: Diagnosis not present

## 2020-09-21 DIAGNOSIS — Z820 Family history of epilepsy and other diseases of the nervous system: Secondary | ICD-10-CM

## 2020-09-21 DIAGNOSIS — Z800 Family history of malignant neoplasm of digestive organs: Secondary | ICD-10-CM

## 2020-09-21 DIAGNOSIS — I160 Hypertensive urgency: Secondary | ICD-10-CM | POA: Diagnosis not present

## 2020-09-21 DIAGNOSIS — E440 Moderate protein-calorie malnutrition: Secondary | ICD-10-CM | POA: Diagnosis present

## 2020-09-21 DIAGNOSIS — Z419 Encounter for procedure for purposes other than remedying health state, unspecified: Secondary | ICD-10-CM

## 2020-09-21 DIAGNOSIS — Z91013 Allergy to seafood: Secondary | ICD-10-CM

## 2020-09-21 DIAGNOSIS — N179 Acute kidney failure, unspecified: Principal | ICD-10-CM

## 2020-09-21 DIAGNOSIS — K219 Gastro-esophageal reflux disease without esophagitis: Secondary | ICD-10-CM | POA: Diagnosis present

## 2020-09-21 DIAGNOSIS — E8779 Other fluid overload: Secondary | ICD-10-CM | POA: Diagnosis not present

## 2020-09-21 DIAGNOSIS — J449 Chronic obstructive pulmonary disease, unspecified: Secondary | ICD-10-CM | POA: Diagnosis present

## 2020-09-21 DIAGNOSIS — I700 Atherosclerosis of aorta: Secondary | ICD-10-CM | POA: Diagnosis present

## 2020-09-21 DIAGNOSIS — E872 Acidosis, unspecified: Secondary | ICD-10-CM | POA: Diagnosis present

## 2020-09-21 DIAGNOSIS — Z8673 Personal history of transient ischemic attack (TIA), and cerebral infarction without residual deficits: Secondary | ICD-10-CM

## 2020-09-21 DIAGNOSIS — I129 Hypertensive chronic kidney disease with stage 1 through stage 4 chronic kidney disease, or unspecified chronic kidney disease: Secondary | ICD-10-CM | POA: Diagnosis not present

## 2020-09-21 DIAGNOSIS — R7989 Other specified abnormal findings of blood chemistry: Secondary | ICD-10-CM

## 2020-09-21 DIAGNOSIS — I132 Hypertensive heart and chronic kidney disease with heart failure and with stage 5 chronic kidney disease, or end stage renal disease: Secondary | ICD-10-CM | POA: Diagnosis not present

## 2020-09-21 DIAGNOSIS — J9601 Acute respiratory failure with hypoxia: Secondary | ICD-10-CM | POA: Diagnosis present

## 2020-09-21 DIAGNOSIS — Z20822 Contact with and (suspected) exposure to covid-19: Secondary | ICD-10-CM | POA: Diagnosis not present

## 2020-09-21 DIAGNOSIS — I10 Essential (primary) hypertension: Secondary | ICD-10-CM | POA: Diagnosis not present

## 2020-09-21 DIAGNOSIS — R509 Fever, unspecified: Secondary | ICD-10-CM | POA: Diagnosis not present

## 2020-09-21 DIAGNOSIS — I480 Paroxysmal atrial fibrillation: Secondary | ICD-10-CM | POA: Diagnosis not present

## 2020-09-21 DIAGNOSIS — R0902 Hypoxemia: Secondary | ICD-10-CM | POA: Diagnosis not present

## 2020-09-21 DIAGNOSIS — I517 Cardiomegaly: Secondary | ICD-10-CM | POA: Diagnosis not present

## 2020-09-21 DIAGNOSIS — Z886 Allergy status to analgesic agent status: Secondary | ICD-10-CM

## 2020-09-21 DIAGNOSIS — T82898A Other specified complication of vascular prosthetic devices, implants and grafts, initial encounter: Secondary | ICD-10-CM | POA: Diagnosis not present

## 2020-09-21 DIAGNOSIS — N181 Chronic kidney disease, stage 1: Secondary | ICD-10-CM | POA: Diagnosis not present

## 2020-09-21 DIAGNOSIS — M898X9 Other specified disorders of bone, unspecified site: Secondary | ICD-10-CM | POA: Diagnosis not present

## 2020-09-21 DIAGNOSIS — R778 Other specified abnormalities of plasma proteins: Secondary | ICD-10-CM | POA: Diagnosis not present

## 2020-09-21 DIAGNOSIS — J440 Chronic obstructive pulmonary disease with acute lower respiratory infection: Secondary | ICD-10-CM | POA: Diagnosis not present

## 2020-09-21 DIAGNOSIS — R0603 Acute respiratory distress: Secondary | ICD-10-CM | POA: Diagnosis present

## 2020-09-21 DIAGNOSIS — I169 Hypertensive crisis, unspecified: Secondary | ICD-10-CM | POA: Diagnosis present

## 2020-09-21 DIAGNOSIS — N184 Chronic kidney disease, stage 4 (severe): Secondary | ICD-10-CM | POA: Diagnosis not present

## 2020-09-21 DIAGNOSIS — M1990 Unspecified osteoarthritis, unspecified site: Secondary | ICD-10-CM | POA: Diagnosis present

## 2020-09-21 DIAGNOSIS — D631 Anemia in chronic kidney disease: Secondary | ICD-10-CM | POA: Diagnosis not present

## 2020-09-21 DIAGNOSIS — R079 Chest pain, unspecified: Secondary | ICD-10-CM

## 2020-09-21 DIAGNOSIS — Z79899 Other long term (current) drug therapy: Secondary | ICD-10-CM

## 2020-09-21 DIAGNOSIS — I82290 Acute embolism and thrombosis of other thoracic veins: Secondary | ICD-10-CM | POA: Diagnosis not present

## 2020-09-21 DIAGNOSIS — I5033 Acute on chronic diastolic (congestive) heart failure: Secondary | ICD-10-CM | POA: Diagnosis present

## 2020-09-21 DIAGNOSIS — Z992 Dependence on renal dialysis: Secondary | ICD-10-CM | POA: Diagnosis not present

## 2020-09-21 DIAGNOSIS — J189 Pneumonia, unspecified organism: Secondary | ICD-10-CM | POA: Diagnosis present

## 2020-09-21 DIAGNOSIS — I32 Pericarditis in diseases classified elsewhere: Secondary | ICD-10-CM | POA: Diagnosis present

## 2020-09-21 DIAGNOSIS — I340 Nonrheumatic mitral (valve) insufficiency: Secondary | ICD-10-CM | POA: Diagnosis not present

## 2020-09-21 DIAGNOSIS — R0602 Shortness of breath: Secondary | ICD-10-CM | POA: Diagnosis not present

## 2020-09-21 DIAGNOSIS — Z8619 Personal history of other infectious and parasitic diseases: Secondary | ICD-10-CM

## 2020-09-21 DIAGNOSIS — N170 Acute kidney failure with tubular necrosis: Secondary | ICD-10-CM | POA: Diagnosis not present

## 2020-09-21 DIAGNOSIS — I509 Heart failure, unspecified: Secondary | ICD-10-CM | POA: Diagnosis not present

## 2020-09-21 DIAGNOSIS — I120 Hypertensive chronic kidney disease with stage 5 chronic kidney disease or end stage renal disease: Secondary | ICD-10-CM | POA: Diagnosis not present

## 2020-09-21 DIAGNOSIS — Z87891 Personal history of nicotine dependence: Secondary | ICD-10-CM

## 2020-09-21 DIAGNOSIS — I499 Cardiac arrhythmia, unspecified: Secondary | ICD-10-CM | POA: Diagnosis not present

## 2020-09-21 DIAGNOSIS — Z743 Need for continuous supervision: Secondary | ICD-10-CM | POA: Diagnosis not present

## 2020-09-21 DIAGNOSIS — Z981 Arthrodesis status: Secondary | ICD-10-CM

## 2020-09-21 DIAGNOSIS — J90 Pleural effusion, not elsewhere classified: Secondary | ICD-10-CM | POA: Diagnosis not present

## 2020-09-21 DIAGNOSIS — Z4901 Encounter for fitting and adjustment of extracorporeal dialysis catheter: Secondary | ICD-10-CM | POA: Diagnosis not present

## 2020-09-21 DIAGNOSIS — Z888 Allergy status to other drugs, medicaments and biological substances status: Secondary | ICD-10-CM

## 2020-09-21 DIAGNOSIS — N185 Chronic kidney disease, stage 5: Secondary | ICD-10-CM | POA: Diagnosis not present

## 2020-09-21 DIAGNOSIS — R6889 Other general symptoms and signs: Secondary | ICD-10-CM | POA: Diagnosis not present

## 2020-09-21 DIAGNOSIS — Z7901 Long term (current) use of anticoagulants: Secondary | ICD-10-CM

## 2020-09-21 DIAGNOSIS — Z881 Allergy status to other antibiotic agents status: Secondary | ICD-10-CM

## 2020-09-21 DIAGNOSIS — Z923 Personal history of irradiation: Secondary | ICD-10-CM

## 2020-09-21 DIAGNOSIS — Z833 Family history of diabetes mellitus: Secondary | ICD-10-CM

## 2020-09-21 DIAGNOSIS — I361 Nonrheumatic tricuspid (valve) insufficiency: Secondary | ICD-10-CM | POA: Diagnosis not present

## 2020-09-21 DIAGNOSIS — Z8249 Family history of ischemic heart disease and other diseases of the circulatory system: Secondary | ICD-10-CM

## 2020-09-21 LAB — URINALYSIS, ROUTINE W REFLEX MICROSCOPIC
Bacteria, UA: NONE SEEN
Bilirubin Urine: NEGATIVE
Glucose, UA: NEGATIVE mg/dL
Hgb urine dipstick: NEGATIVE
Ketones, ur: NEGATIVE mg/dL
Nitrite: NEGATIVE
Protein, ur: 100 mg/dL — AB
Specific Gravity, Urine: 1.01 (ref 1.005–1.030)
pH: 7 (ref 5.0–8.0)

## 2020-09-21 LAB — COMPREHENSIVE METABOLIC PANEL
ALT: 27 U/L (ref 0–44)
AST: 32 U/L (ref 15–41)
Albumin: 3 g/dL — ABNORMAL LOW (ref 3.5–5.0)
Alkaline Phosphatase: 114 U/L (ref 38–126)
Anion gap: 19 — ABNORMAL HIGH (ref 5–15)
BUN: 114 mg/dL — ABNORMAL HIGH (ref 8–23)
CO2: 17 mmol/L — ABNORMAL LOW (ref 22–32)
Calcium: 7.6 mg/dL — ABNORMAL LOW (ref 8.9–10.3)
Chloride: 105 mmol/L (ref 98–111)
Creatinine, Ser: 14.2 mg/dL — ABNORMAL HIGH (ref 0.44–1.00)
GFR, Estimated: 3 mL/min — ABNORMAL LOW (ref >60)
Glucose, Bld: 101 mg/dL — ABNORMAL HIGH (ref 70–99)
Potassium: 4.1 mmol/L (ref 3.5–5.1)
Sodium: 141 mmol/L (ref 135–145)
Total Bilirubin: 0.8 mg/dL (ref 0.3–1.2)
Total Protein: 6.8 g/dL (ref 6.5–8.1)

## 2020-09-21 LAB — LACTIC ACID, PLASMA: Lactic Acid, Venous: 1 mmol/L (ref 0.5–1.9)

## 2020-09-21 LAB — CBC WITH DIFFERENTIAL/PLATELET
Abs Immature Granulocytes: 0.05 10*3/uL (ref 0.00–0.07)
Basophils Absolute: 0.1 10*3/uL (ref 0.0–0.1)
Basophils Relative: 1 %
Eosinophils Absolute: 0.2 10*3/uL (ref 0.0–0.5)
Eosinophils Relative: 2 %
HCT: 23.4 % — ABNORMAL LOW (ref 36.0–46.0)
Hemoglobin: 7.3 g/dL — ABNORMAL LOW (ref 12.0–15.0)
Immature Granulocytes: 1 %
Lymphocytes Relative: 7 %
Lymphs Abs: 0.6 10*3/uL — ABNORMAL LOW (ref 0.7–4.0)
MCH: 28.5 pg (ref 26.0–34.0)
MCHC: 31.2 g/dL (ref 30.0–36.0)
MCV: 91.4 fL (ref 80.0–100.0)
Monocytes Absolute: 0.5 10*3/uL (ref 0.1–1.0)
Monocytes Relative: 6 %
Neutro Abs: 7 10*3/uL (ref 1.7–7.7)
Neutrophils Relative %: 83 %
Platelets: 411 10*3/uL — ABNORMAL HIGH (ref 150–400)
RBC: 2.56 MIL/uL — ABNORMAL LOW (ref 3.87–5.11)
RDW: 16.6 % — ABNORMAL HIGH (ref 11.5–15.5)
WBC: 8.4 10*3/uL (ref 4.0–10.5)
nRBC: 0 % (ref 0.0–0.2)

## 2020-09-21 LAB — TROPONIN I (HIGH SENSITIVITY): Troponin I (High Sensitivity): 120 ng/L (ref <18)

## 2020-09-21 LAB — APTT: aPTT: 40 seconds — ABNORMAL HIGH (ref 24–36)

## 2020-09-21 LAB — RESP PANEL BY RT-PCR (FLU A&B, COVID) ARPGX2
Influenza A by PCR: NEGATIVE
Influenza B by PCR: NEGATIVE
SARS Coronavirus 2 by RT PCR: NEGATIVE

## 2020-09-21 LAB — PROTIME-INR
INR: 1.1 (ref 0.8–1.2)
Prothrombin Time: 14.4 seconds (ref 11.4–15.2)

## 2020-09-21 MED ORDER — AMLODIPINE BESYLATE 10 MG PO TABS
10.0000 mg | ORAL_TABLET | Freq: Every day | ORAL | Status: DC
Start: 2020-09-22 — End: 2020-09-27
  Administered 2020-09-22 – 2020-09-27 (×7): 10 mg via ORAL
  Filled 2020-09-21 (×7): qty 1

## 2020-09-21 MED ORDER — SODIUM CHLORIDE 0.9 % IV SOLN
500.0000 mg | Freq: Once | INTRAVENOUS | Status: AC
  Administered 2020-09-21: 500 mg via INTRAVENOUS
  Filled 2020-09-21: qty 500

## 2020-09-21 MED ORDER — CHLORHEXIDINE GLUCONATE CLOTH 2 % EX PADS
6.0000 | MEDICATED_PAD | Freq: Every day | CUTANEOUS | Status: DC
  Administered 2020-09-22 – 2020-09-24 (×3): 6 via TOPICAL

## 2020-09-21 MED ORDER — SENNOSIDES-DOCUSATE SODIUM 8.6-50 MG PO TABS: 1.0000 | ORAL_TABLET | Freq: Every evening | ORAL | Status: DC | PRN

## 2020-09-21 MED ORDER — ACETAMINOPHEN 325 MG PO TABS: 650.0000 mg | ORAL_TABLET | Freq: Four times a day (QID) | ORAL | Status: DC | PRN

## 2020-09-21 MED ORDER — SODIUM CHLORIDE 0.9 % IV SOLN
500.0000 mg | INTRAVENOUS | Status: DC
  Filled 2020-09-21: qty 500

## 2020-09-21 MED ORDER — HYDRALAZINE HCL 25 MG PO TABS
25.0000 mg | ORAL_TABLET | Freq: Four times a day (QID) | ORAL | Status: DC | PRN
  Administered 2020-09-22 – 2020-09-26 (×5): 25 mg via ORAL
  Filled 2020-09-21 (×6): qty 1

## 2020-09-21 MED ORDER — SODIUM CHLORIDE 0.9 % IV SOLN
1.0000 g | Freq: Once | INTRAVENOUS | Status: AC
  Administered 2020-09-21: 1 g via INTRAVENOUS
  Filled 2020-09-21: qty 10

## 2020-09-21 MED ORDER — RENA-VITE PO TABS
1.0000 | ORAL_TABLET | Freq: Every day | ORAL | Status: DC
  Administered 2020-09-22 – 2020-09-26 (×6): 1 via ORAL
  Filled 2020-09-21 (×6): qty 1

## 2020-09-21 MED ORDER — METOPROLOL SUCCINATE ER 50 MG PO TB24
50.0000 mg | ORAL_TABLET | Freq: Every day | ORAL | Status: DC
  Administered 2020-09-22: 50 mg via ORAL
  Filled 2020-09-21: qty 1

## 2020-09-21 MED ORDER — ONDANSETRON HCL 4 MG PO TABS: 4.0000 mg | ORAL_TABLET | Freq: Four times a day (QID) | ORAL | Status: DC | PRN

## 2020-09-21 MED ORDER — SODIUM CHLORIDE 0.9 % IV SOLN: 250.0000 mL | INTRAVENOUS | Status: DC | PRN

## 2020-09-21 MED ORDER — ROSUVASTATIN CALCIUM 5 MG PO TABS
10.0000 mg | ORAL_TABLET | Freq: Every day | ORAL | Status: DC
  Administered 2020-09-22 – 2020-09-27 (×6): 10 mg via ORAL
  Filled 2020-09-21 (×6): qty 2

## 2020-09-21 MED ORDER — FENTANYL CITRATE (PF) 100 MCG/2ML IJ SOLN
25.0000 ug | INTRAMUSCULAR | Status: DC | PRN
  Administered 2020-09-22 – 2020-09-26 (×9): 25 ug via INTRAVENOUS
  Filled 2020-09-21 (×10): qty 2

## 2020-09-21 MED ORDER — HEPARIN BOLUS VIA INFUSION
2000.0000 [IU] | Freq: Once | INTRAVENOUS | Status: AC
  Administered 2020-09-22: 2000 [IU] via INTRAVENOUS
  Filled 2020-09-21: qty 2000

## 2020-09-21 MED ORDER — SODIUM CHLORIDE 0.9 % IV SOLN
2.0000 g | INTRAVENOUS | Status: AC
  Administered 2020-09-22 – 2020-09-25 (×4): 2 g via INTRAVENOUS
  Filled 2020-09-21 (×3): qty 20
  Filled 2020-09-21: qty 2

## 2020-09-21 MED ORDER — ONDANSETRON HCL 4 MG/2ML IJ SOLN
4.0000 mg | Freq: Four times a day (QID) | INTRAMUSCULAR | Status: DC | PRN
  Administered 2020-09-22 – 2020-09-23 (×2): 4 mg via INTRAVENOUS
  Filled 2020-09-21 (×2): qty 2

## 2020-09-21 MED ORDER — HYDRALAZINE HCL 25 MG PO TABS
25.0000 mg | ORAL_TABLET | Freq: Two times a day (BID) | ORAL | Status: DC
  Administered 2020-09-22 – 2020-09-27 (×12): 25 mg via ORAL
  Filled 2020-09-21 (×12): qty 1

## 2020-09-21 MED ORDER — HEPARIN SODIUM (PORCINE) 5000 UNIT/ML IJ SOLN: 5000.0000 [IU] | Freq: Three times a day (TID) | INTRAMUSCULAR | Status: DC

## 2020-09-21 MED ORDER — ACETAMINOPHEN 650 MG RE SUPP: 650.0000 mg | Freq: Four times a day (QID) | RECTAL | Status: DC | PRN

## 2020-09-21 MED ORDER — HEPARIN (PORCINE) 25000 UT/250ML-% IV SOLN
1000.0000 [IU]/h | INTRAVENOUS | Status: DC
  Administered 2020-09-22: 800 [IU]/h via INTRAVENOUS
  Administered 2020-09-23: 1000 [IU]/h via INTRAVENOUS
  Filled 2020-09-21 (×2): qty 250

## 2020-09-21 MED ORDER — ALBUTEROL SULFATE HFA 108 (90 BASE) MCG/ACT IN AERS: 2.0000 | INHALATION_SPRAY | Freq: Four times a day (QID) | RESPIRATORY_TRACT | Status: DC | PRN

## 2020-09-21 NOTE — Progress Notes (Signed)
ANTICOAGULATION CONSULT NOTE - Initial Consult  Pharmacy Consult for Heparin Indication: atrial fibrillation  Allergies  Allergen Reactions  . Shrimp [Shellfish Allergy] Shortness Of Breath  . Bactroban [Mupirocin] Other (See Comments)    "Sores in nose"  . Vicodin [Hydrocodone-Acetaminophen] Itching and Nausea And Vomiting    This is patient's home medication  . Eliquis [Apixaban] Rash  . Lisinopril Cough  . Tylenol [Acetaminophen] Itching    Patient Measurements: Height: 5\' 3"  (160 cm) Weight: 46.7 kg (102 lb 15.3 oz) IBW/kg (Calculated) : 52.4  Vital Signs: Temp: 99.7 F (37.6 C) (04/23 1951) Temp Source: Oral (04/23 1951) BP: 171/101 (04/23 2029) Pulse Rate: 90 (04/23 2029)  Labs: Recent Labs    09/21/20 2021  HGB 7.3*  HCT 23.4*  PLT 411*  APTT 40*  LABPROT 14.4  INR 1.1  CREATININE 14.20*  TROPONINIHS 120*    Estimated Creatinine Clearance: 2.8 mL/min (A) (by C-G formula based on SCr of 14.2 mg/dL (H)).   Medical History: Past Medical History:  Diagnosis Date  . AF (paroxysmal atrial fibrillation) ([place]) 05/29/2019  . Aortic atherosclerosis ([place]) 07/05/2019  . Aortic dissection ([place]) 04/04/2019  . Atelectasis 2002   Bilateral  . Atrial fibrillation with RVR ([place]) 05/18/2019  . Atypical chest pain 07/05/2019  . Bone spur 2008   Right calcaneal foot spur  . Breast cancer ([place]) 2004   Ductal carcinoma in situ of the left breast; S/P left partial mastectomy 02/26/2003; S/P re-excision of cranial and lateral margins11/18/2004.radiation  . Breast cancer ([place]) 09/21/2012   right breast/ last radiation treatment 03/22/2013  . Cerebral thrombosis with cerebral infarction 05/22/2019  . Chronic kidney disease, stage IV (severe) ([place]) 10/10/2007  . Chronic low back pain 06/22/2016  . Chronic obstructive lung disease ([place]) 01/16/2017  . CKD (chronic kidney disease), stage IV ([place]) 07/19/2017  . 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 Autumn Messing; S/P re-excision of superior and inferior margins 10/27/2012.   . Dissection of aorta ([place]) 04/03/2019  . Elevated troponin 08/28/2019  . ESRD on dialysis ([place]) 05/29/2019  . Essential hypertension 09/16/2006  . GERD 09/16/2006  . GERD (gastroesophageal reflux disease)   . H/O: stroke 05/29/2019  . 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 Hot flashes   . Hx of radiation therapy 2005   left breast  . Hx of radiation therapy 01/11/13- 03/22/13   right breast 5760 cGy 30 sessions  . Hyperkalemia 08/28/2019  . Hypertension   . Insomnia 03/14/2015  . Low back pain   . Lumbar spinal stenosis    S/P lumbar decompressive laminectomy, fusion and plating for lumbar spinal stensosis  . Malnutrition of moderate degree 05/19/2019  . Non compliance with medical treatment 12/04/2017  . Normocytic anemia    With thrombocytosis  . On Coumadin for atrial fibrillation ([place]) 10/03/2019  . Osteoarthritis   . Paroxysmal SVT (supraventricular tachycardia) ([place]) 08/27/2019  . Personal history of radiation therapy   . Pleural effusion 05/18/2019  . Positive D dimer 08/28/2019  . Right ureteral stone 2002  . S/P aortic aneurysm repair 04/07/2019  . S/P lumbar spinal fusion 01/18/2014   S/P lumbar decompressive laminectomy, fusion, and plating for lumbar spinal stenosis on 05/27/2009 by Dr. Eustace Moore.  S/P anterolateral retroperitoneal interbody fusion L2-3 utilizing a 8 mm peek interbody cage packed with morcellized allograft, and anterior lumbar plating L2-3 for recurrent disc herniation  L2-3 with spinal stenosis on 01/18/2014 by Dr. Eustace Moore.    . Shortness of breath    from pain  . SIRS (systemic inflammatory response syndrome) ([place]) 08/27/2019  . SVT (supraventricular tachycardia) ([place]) 08/28/2019  . Tobacco use disorder 04/19/2009  . Uterine fibroid   . Wears dentures    top    Medications:  No current facility-administered medications  on file prior to encounter.   Current Outpatient Medications on File Prior to Encounter  Medication Sig Dispense Refill  . albuterol (VENTOLIN HFA) 108 (90 Base) MCG/ACT inhaler Inhale 2 puffs into the lungs every 6 (six) hours as needed for wheezing or shortness of breath. 8.5 g 0  . amLODipine (NORVASC) 10 MG tablet TAKE 1 TABLET (10 MG TOTAL) BY MOUTH DAILY. 90 tablet 0  . cloNIDine (CATAPRES - DOSED IN MG/24 HR) 0.3 mg/24hr patch Place 1 patch (0.3 mg total) onto the skin once a week. 4 patch 2  . furosemide (LASIX) 20 MG tablet Take 1 tablet (20 mg total) by mouth daily. 60 tablet 5  . hydrALAZINE (APRESOLINE) 25 MG tablet Take 1 tablet (25 mg total) by mouth 2 (two) times daily.    Marland Kitchen losartan (COZAAR) 100 MG tablet Take 100 mg by mouth at bedtime.    . metoprolol succinate (TOPROL-XL) 50 MG 24 hr tablet Take 1 tablet (50 mg total) by mouth daily. Take with or immediately following a meal. 90 tablet 1  . mirtazapine (REMERON) 7.5 MG tablet Take 1 tablet (7.5 mg total) by mouth at bedtime. 90 tablet 3  . ondansetron (ZOFRAN) 4 MG tablet Take 1 tablet (4 mg total) by mouth every 8 (eight) hours as needed for nausea or vomiting. 20 tablet 3  . oxyCODONE-acetaminophen (PERCOCET/ROXICET) 5-325 MG tablet Take 1 tablet by mouth 3 (three) times daily as needed for severe pain (pain.).    Marland Kitchen rosuvastatin (CRESTOR) 10 MG tablet TAKE 1 TABLET (10 MG TOTAL) BY MOUTH DAILY. 30 tablet 0  . warfarin (COUMADIN) 6 MG tablet TAKE 1 TABLET (6 MG TOTAL) BY MOUTH 4 (FOUR) TIMES A WEEK. 30 tablet 0  . warfarin (COUMADIN) 7.5 MG tablet Take 1 tablet (7.5 mg total) by mouth 3 (three) times a week. 90 tablet 0     Assessment: 70 y.o. female with h/o Afib on Coumadin, INR subtherapeutic, for heparin.  Noted pt required higher than usual heparin rates during previous admission 07/2019  Goal of Therapy:  Heparin level 0.3-0.7 units/mL Monitor platelets by anticoagulation protocol: Yes   Plan:  Heparin 2000 units  IV bolus, then start heparin 800 units/hr Check heparin level in 8 hours.   Caryl Pina 09/21/2020,11:32 PM

## 2020-09-21 NOTE — Consult Note (Signed)
Renal Service Consult Note [facility]  Monique Henderson 09/21/2020 Sol Blazing, MD Requesting Physician: Dr. Almyra Free, Lenna Sciara.   Reason for Consult: Renal failure HPI: The patient is a 70 y.o. year-old w/ hx of tobacco use, atrial fib, anemia, HTN, hep C, hx CVA, hx aortic dissection sp repair (Nov 2020), hx breast cancer, COPD and back surgery who presents w/ chest pain, prod cough and fever for about 1 week. Seen by PCP today and had negative COVID and flu tests. EMS reported SpO2 87% on RA.  HR 160 then down to 90 in ED.  No vomiting or diarrhea. In ED labs showed BUN 114, Creat 14.20.  Last creat was 5.8 in Dec 2021, and 3.78 in Aug 2021.  Pt has hx of being on HD in 2020 for some time, then was taken off. Has AVF in her arm.  WBC 8 K Hb 7.3  Alb 3.0  LFT's okay  Ca 7.6, AG 19.  Asked to see for renal failure.   Pt c/o coughing up phlegm, orthopnea, unable to sleep at night, some yellow phlegm. +fevers. No nausea , no vomiting, no confusion or jerking. Denies any chronic fatigue. Has been sick for about 1 week.  No abd pain, no n/v/d.   Had AKI on CKD and had to go on HD after her TAA repair surgery in Nov 2020. Pt says they used her [facility], they "never had to use" her L arm access/ AVF (placed 04/2019) while she was on dialysis.  Thereafter she was able to come off of dialysis.   ROS  denies CP  no joint pain   no HA  no blurry vision  no rash  no diarrhea  no nausea/ vomiting  no dysuria  no difficulty voiding  no change in urine color    Past Medical History  Past Medical History:  Diagnosis Date  . AF (paroxysmal atrial fibrillation) ([place]) 05/29/2019  . Aortic atherosclerosis ([place]) 07/05/2019  . Aortic dissection ([place]) 04/04/2019  . Atelectasis 2002   Bilateral  . Atrial fibrillation with RVR ([place]) 05/18/2019  . Atypical chest pain 07/05/2019  . Bone spur 2008   Right calcaneal foot spur  . Breast cancer ([place]) 2004   Ductal carcinoma in situ of the left breast;  S/P left partial mastectomy 02/26/2003; S/P re-excision of cranial and lateral margins11/18/2004.radiation  . Breast cancer ([place]) 09/21/2012   right breast/ last radiation treatment 03/22/2013  . Cerebral thrombosis with cerebral infarction 05/22/2019  . Chronic kidney disease, stage IV (severe) ([place]) 10/10/2007  . Chronic low back pain 06/22/2016  . Chronic obstructive lung disease ([place]) 01/16/2017  . CKD (chronic kidney disease), stage IV ([place]) 07/19/2017  . 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 Autumn Messing; S/P re-excision of superior and inferior margins 10/27/2012.   . Dissection of aorta ([place]) 04/03/2019  . Elevated troponin 08/28/2019  . ESRD on dialysis ([place]) 05/29/2019  . Essential hypertension 09/16/2006  . GERD 09/16/2006  . GERD (gastroesophageal reflux disease)   . H/O: stroke 05/29/2019  . 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 Hot flashes   . Hx of radiation therapy 2005   left breast  . Hx of radiation therapy 01/11/13- 03/22/13   right breast 5760 cGy 30 sessions  . Hyperkalemia 08/28/2019  . Hypertension   . Insomnia 03/14/2015  . Low back pain   . Lumbar spinal  stenosis    S/P lumbar decompressive laminectomy, fusion and plating for lumbar spinal stensosis  . Malnutrition of moderate degree 05/19/2019  . Non compliance with medical treatment 12/04/2017  . Normocytic anemia    With thrombocytosis  . On Coumadin for atrial fibrillation ([place]) 10/03/2019  . Osteoarthritis   . Paroxysmal SVT (supraventricular tachycardia) ([place]) 08/27/2019  . Personal history of radiation therapy   . Pleural effusion 05/18/2019  . Positive D dimer 08/28/2019  . Right ureteral stone 2002  . S/P aortic aneurysm repair 04/07/2019  . S/P lumbar spinal fusion 01/18/2014   S/P lumbar decompressive laminectomy, fusion, and plating for lumbar spinal stenosis on 05/27/2009 by Dr. Eustace Moore.  S/P anterolateral  retroperitoneal interbody fusion L2-3 utilizing a 8 mm peek interbody cage packed with morcellized allograft, and anterior lumbar plating L2-3 for recurrent disc herniation L2-3 with spinal stenosis on 01/18/2014 by Dr. Eustace Moore.    . Shortness of breath    from pain  . SIRS (systemic inflammatory response syndrome) ([place]) 08/27/2019  . SVT (supraventricular tachycardia) ([place]) 08/28/2019  . Tobacco use disorder 04/19/2009  . Uterine fibroid   . Wears dentures    top   Past Surgical History  Past Surgical History:  Procedure Laterality Date  . ANTERIOR LAT LUMBAR FUSION N/A 01/18/2014   Procedure: ANTERIOR LATERAL LUMBAR FUSION LUMBAR TWO-THREE;  Surgeon: Eustace Moore, MD;  Location: [place] [facility];  Service: Neurosurgery;  Laterality: N/A;  . ANTERIOR LUMBAR FUSION  01/18/2014  . AV FISTULA PLACEMENT Left 04/20/2019   Procedure: ARTERIOVENOUS (AV) FISTULA CREATION;  Surgeon: [name], MD;  Location: [place];  Service: Vascular;  Laterality: Left;  . BACK SURGERY    . BREAST LUMPECTOMY Left 01/2003  . BREAST LUMPECTOMY Right 2014  . BREAST LUMPECTOMY WITH NEEDLE LOCALIZATION AND AXILLARY SENTINEL LYMPH NODE BX Right 10/13/2012   Procedure: BREAST LUMPECTOMY WITH NEEDLE LOCALIZATION;  Surgeon: [name], MD;  Location: [place];  Service: General;  Laterality: Right;  Right breast wire localized lumpectomy  . INSERTION OF DIALYSIS CATHETER Right 04/20/2019   Procedure: INSERTION OF DIALYSIS CATHETER, right internal jugular;  Surgeon: [name], MD;  Location: [place];  Service: Vascular;  Laterality: Right;  . IR THORACENTESIS ASP PLEURAL SPACE W/IMG GUIDE  05/19/2019  . LAMINECTOMY  05/27/2009   Lumbar decompressive laminectomy, fusion and plating for lumbar spinal stensosis  . LUMBAR LAMINECTOMY/DECOMPRESSION MICRODISCECTOMY Left 03/23/2013   Procedure: LUMBAR LAMINECTOMY/DECOMPRESSION MICRODISCECTOMY 1 LEVEL;  Surgeon: Eustace Moore, MD;   Location: [place] [facility];  Service: Neurosurgery;  Laterality: Left;  LUMBAR LAMINECTOMY/DECOMPRESSION MICRODISCECTOMY 1 LEVEL  . MASTECTOMY, PARTIAL Left 02/26/2003   ; S/P re-excision of cranial and lateral margins 04/19/2003.   Marland Kitchen RE-EXCISION OF BREAST CANCER,SUPERIOR MARGINS Right 10/27/2012   Procedure: RE-EXCISION OF BREAST CANCER,SUPERIOR and inferior MARGINS;  Surgeon: [name], MD;  Location: [place];  Service: General;  Laterality: Right;  . RE-EXCISION OF BREAST LUMPECTOMY Left 04/2003  . TEE WITHOUT CARDIOVERSION N/A 04/04/2019   Procedure: Transesophageal Echocardiogram ([place]);  Surgeon: Wonda Olds, MD;  Location: [place];  Service: Open Heart Surgery;  Laterality: N/A;  . THORACIC AORTIC ANEURYSM REPAIR N/A 04/04/2019   Procedure: THORACIC ASCENDING ANEURYSM REPAIR (AAA)  USING 28 MM X 30 CM HEMASHIELD PLATINUM VASCULAR GRAFT;  Surgeon: Wonda Olds, MD;  Location: [facility];  Service: Open Heart Surgery;  Laterality: N/A;   Family History  Family  History  Problem Relation Age of Onset  . Colon cancer Mother 74  . Hypertension Mother   . Diabetes Sister 52  . Hypertension Sister   . Diabetes Brother   . Hypertension Brother   . Diabetes Brother   . Hypertension Brother   . Kidney disease Son        s/p renal transplant  . Hypertension Son   . Diabetes Son   . Multiple sclerosis Son   . Bone cancer Sister 7  . Breast cancer Neg Hx   . Cervical cancer Neg Hx    Social History  reports that she quit smoking about 15 months ago. Her smoking use included cigarettes. She has a 11.00 pack-year smoking history. She has never used smokeless tobacco. She reports previous alcohol use of about 2.0 standard drinks of alcohol per week. She reports previous drug use. Drug: Cocaine. Allergies  Allergies  Allergen Reactions  . Shrimp [Shellfish Allergy] Shortness Of Breath  . Bactroban [Mupirocin] Other (See Comments)    "Sores in nose"  . Vicodin [Hydrocodone-Acetaminophen]  Itching and Nausea And Vomiting    This is patient's home medication  . Eliquis [Apixaban] Rash  . Lisinopril Cough  . Tylenol [Acetaminophen] Itching   Home medications Prior to Admission medications   Medication Sig Start Date End Date Taking? Authorizing Provider  albuterol (VENTOLIN HFA) 108 (90 Base) MCG/ACT inhaler Inhale 2 puffs into the lungs every 6 (six) hours as needed for wheezing or shortness of breath. 07/30/20   Maximiano Coss, NP  amLODipine (NORVASC) 10 MG tablet TAKE 1 TABLET (10 MG TOTAL) BY MOUTH DAILY. 09/20/18   Jacelyn Pi, Lilia Argue, MD  cloNIDine (CATAPRES - DOSED IN MG/24 HR) 0.3 mg/24hr patch Place 1 patch (0.3 mg total) onto the skin once a week. 04/22/19   Nani Skillern, PA-C  furosemide (LASIX) 20 MG tablet Take 1 tablet (20 mg total) by mouth daily. 01/05/20   Jacelyn Pi, Lilia Argue, MD  hydrALAZINE (APRESOLINE) 25 MG tablet Take 1 tablet (25 mg total) by mouth 2 (two) times daily. 01/05/20   Daleen Squibb, MD  losartan (COZAAR) 100 MG tablet Take 100 mg by mouth at bedtime. 08/02/19   [provider]  metoprolol succinate (TOPROL-XL) 50 MG 24 hr tablet Take 1 tablet (50 mg total) by mouth daily. Take with or immediately following a meal. 01/05/20   Jacelyn Pi, Lilia Argue, MD  mirtazapine (REMERON) 7.5 MG tablet Take 1 tablet (7.5 mg total) by mouth at bedtime. 07/15/20   Maximiano Coss, NP  ondansetron (ZOFRAN) 4 MG tablet Take 1 tablet (4 mg total) by mouth every 8 (eight) hours as needed for nausea or vomiting. 08/03/19   Jacelyn Pi, Lilia Argue, MD  oxyCODONE-acetaminophen (PERCOCET/ROXICET) 5-325 MG tablet Take 1 tablet by mouth 3 (three) times daily as needed for severe pain (pain.).    [provider]  rosuvastatin (CRESTOR) 10 MG tablet TAKE 1 TABLET (10 MG TOTAL) BY MOUTH DAILY. 07/03/20 10/01/20  Patwardhan, Reynold Bowen, MD  warfarin (COUMADIN) 6 MG tablet TAKE 1 TABLET (6 MG TOTAL) BY MOUTH 4 (FOUR) TIMES A WEEK. 07/04/20   Maximiano Coss, NP   warfarin (COUMADIN) 7.5 MG tablet Take 1 tablet (7.5 mg total) by mouth 3 (three) times a week. 05/15/20   Maximiano Coss, NP     Vitals:   09/21/20 1951 09/21/20 2001 09/21/20 2029  BP: (!) 193/109  (!) 171/101  Pulse: 90  90  Resp: 20  16  Temp: 99.7 F (37.6 C)    TempSrc: Oral    SpO2: (!) 87%  95%  Weight:  46.7 kg   Height:  5\' 3"  (1.6 m)    Exam Gen alert, no distress No rash, cyanosis or gangrene Sclera anicteric, throat clear  No jvd or bruits Chest rales 1/2 up on R, minimal rales L base RRR no MRG, sternotomy scar well-healed Abd soft ntnd no mass or ascites +bs GU normal MS no joint effusions or deformity Ext no LE or UE edema, no wounds or ulcers Neuro is alert, Ox 3 , nf  LUA AVF + bruit/ thrill      Home meds:  - norvasc 10 / losartan 100 hs/ clonidine patch 0.3 weekly/ lasix 20 qd/ hydralazine 25 bid/ metoprolol xl 50 qd  - warfarin as directed  - crestor 10 qd  - remeron 7.5 hs/ percocet qid prn  - prn's/ vitamins/ supplements       Na 141 K 4.1  CO2 17  AG 19  BUN 114  Cr 14.20  Ca 7.56  Alb 3.0  WBC 8K     Hb 7.3  mcv 91    CXR 4/23 - IMPRESSION: Mild-to-moderate bilateral lower lung predominant airspace opacities likely represent pneumonia. A small left pleural effusion may contribute. Cardiomegaly.    BP 190 /101  HR 80-90  RR 20- 26  Temp 99.7  87% on RA > 95 % on 2L [place]       Assessment/ Plan: 1. SOB/ hypoxic acute resp failure - due to PNA and/or pulm edema. Pt w/ cough and fevers and CXR could be pna and/ or edema. BP's are high. +orthopnea, not able to sleep. Plan HD for volume removal. Fluid restriction.  2. AKI on CKD V - b/l creat 5.5 from nov-dec 2021. Was on HD after TAA surgery for a short time in late 2020. Creat today is 14.2. No nausea or confusion or asterixis. Having sig resp issues / orthopnea.  +AVF LUA. Not sure if this is progression to ESRD vs AKI.  Will plan for HD this evening, get volume and solute down and then  reassess for further HD needs.  3. HTN - bp's high, hold losartan, cont other BP lowering meds. +HD for vol excess 4. SP TAA repair - surgery done in Nov 2020 5. H/o breast cancer - rx'd in 2004 and then in 2014 6. H/o back surgery 7. Anemia - Hb 7.3, suspect due to renal failure to some degree. Follow, consider esa, get Fe studies 8. Nutrition - renavite, renal diet      Kelly Splinter  MD 09/21/2020, 10:19 PM  Recent Labs  Lab 09/21/20 2021  WBC 8.4  HGB 7.3*   Recent Labs  Lab 09/21/20 2021  K 4.1  BUN 114*  CREATININE 14.20*  CALCIUM 7.6*

## 2020-09-21 NOTE — H&P (Addendum)
History and Physical    Monique Henderson YBW:[id number] DOB: 1950/12/16 DOA: 09/21/2020  PCP: Maximiano Coss, NP   Patient coming from: Home   Chief Complaint: Chest pain, productive cough, subjective fever, SOB   HPI: Monique Henderson is a 70 y.o. 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  Patient reports approximately 1 week of chest pain that has been worse with cough, cough productive of white and yellow sputum, orthopnea, loss of appetite, and subjective fevers.  She denies any recent leg swelling, denies melena or hematochezia, and reports that she is still taking warfarin.  ED Course: Upon arrival to the ED, patient is found to be afebrile, saturating 87% on room air, and with blood pressure as high as 193/109.  EKG features sinus rhythm with sinus arrhythmia.  Chest x-ray with cardiomegaly and bilateral lower lung airspace opacities.  Chemistry panel features a bicarbonate of 17, BUN 114, and creatinine 14.2.  CBC notable for hemoglobin of 7.3.  Lactic acid is normal, INR 1.1, and troponin 120.  Cardiology and nephrology were consulted by the ED physician.  Review of Systems:  All other systems reviewed and apart from HPI, are negative.  Past Medical History:  Diagnosis Date  . AF (paroxysmal atrial fibrillation) ([place]) 05/29/2019  . Aortic atherosclerosis ([place]) 07/05/2019  . Aortic dissection ([place]) 04/04/2019  . Atelectasis 2002   Bilateral  . Atrial fibrillation with RVR ([place]) 05/18/2019  . Atypical chest pain 07/05/2019  . Bone spur 2008   Right calcaneal foot spur  . Breast cancer ([place]) 2004   Ductal carcinoma in situ of the left breast; S/P left partial mastectomy  02/26/2003; S/P re-excision of cranial and lateral margins11/18/2004.radiation  . Breast cancer ([place]) 09/21/2012   right breast/ last radiation treatment 03/22/2013  . Cerebral thrombosis with cerebral infarction 05/22/2019  . Chronic kidney disease, stage IV (severe) ([place]) 10/10/2007  . Chronic low back pain 06/22/2016  . Chronic obstructive lung disease ([place]) 01/16/2017  . CKD (chronic kidney disease), stage IV ([place]) 07/19/2017  . 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 Autumn Messing; S/P re-excision of superior and inferior margins 10/27/2012.   . Dissection of aorta ([place]) 04/03/2019  . Elevated troponin 08/28/2019  . ESRD on dialysis ([place]) 05/29/2019  . Essential hypertension 09/16/2006  . GERD 09/16/2006  . GERD (gastroesophageal reflux disease)   . H/O: stroke 05/29/2019  . 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 Hot flashes   . Hx of radiation therapy 2005   left breast  . Hx of radiation therapy 01/11/13- 03/22/13   right breast 5760 cGy 30 sessions  . Hyperkalemia 08/28/2019  . Hypertension   . Insomnia 03/14/2015  . Low back pain   . Lumbar spinal stenosis    S/P lumbar decompressive laminectomy, fusion and plating for lumbar spinal stensosis  . Malnutrition of moderate degree 05/19/2019  . Non compliance with medical treatment 12/04/2017  . Normocytic anemia    With thrombocytosis  . On Coumadin for atrial fibrillation ([place]) 10/03/2019  . Osteoarthritis   . Paroxysmal SVT (supraventricular tachycardia) ([place]) 08/27/2019  . Personal history of radiation therapy   .  Pleural effusion 05/18/2019  . Positive D dimer 08/28/2019  . Right ureteral stone 2002  . S/P aortic aneurysm repair 04/07/2019  . S/P lumbar spinal fusion 01/18/2014   S/P lumbar decompressive laminectomy, fusion, and plating for lumbar spinal stenosis on 05/27/2009 by Dr. Eustace Moore.  S/P anterolateral retroperitoneal interbody fusion  L2-3 utilizing a 8 mm peek interbody cage packed with morcellized allograft, and anterior lumbar plating L2-3 for recurrent disc herniation L2-3 with spinal stenosis on 01/18/2014 by Dr. Eustace Moore.    . Shortness of breath    from pain  . SIRS (systemic inflammatory response syndrome) ([place]) 08/27/2019  . SVT (supraventricular tachycardia) ([place]) 08/28/2019  . Tobacco use disorder 04/19/2009  . Uterine fibroid   . Wears dentures    top    Past Surgical History:  Procedure Laterality Date  . ANTERIOR LAT LUMBAR FUSION N/A 01/18/2014   Procedure: ANTERIOR LATERAL LUMBAR FUSION LUMBAR TWO-THREE;  Surgeon: Eustace Moore, MD;  Location: [facility];  Service: Neurosurgery;  Laterality: N/A;  . ANTERIOR LUMBAR FUSION  01/18/2014  . AV FISTULA PLACEMENT Left 04/20/2019   Procedure: ARTERIOVENOUS (AV) FISTULA CREATION;  Surgeon: [name], MD;  Location: [facility];  Service: Vascular;  Laterality: Left;  . BACK SURGERY    . BREAST LUMPECTOMY Left 01/2003  . BREAST LUMPECTOMY Right 2014  . BREAST LUMPECTOMY WITH NEEDLE LOCALIZATION AND AXILLARY SENTINEL LYMPH NODE BX Right 10/13/2012   Procedure: BREAST LUMPECTOMY WITH NEEDLE LOCALIZATION;  Surgeon: [name], MD;  Location: [facility];  Service: General;  Laterality: Right;  Right breast wire localized lumpectomy  . INSERTION OF DIALYSIS CATHETER Right 04/20/2019   Procedure: INSERTION OF DIALYSIS CATHETER, right internal jugular;  Surgeon: [name], MD;  Location: [facility];  Service: Vascular;  Laterality: Right;  . IR THORACENTESIS ASP PLEURAL SPACE W/IMG GUIDE  05/19/2019  . LAMINECTOMY  05/27/2009   Lumbar decompressive laminectomy, fusion and plating for lumbar spinal stensosis  . LUMBAR LAMINECTOMY/DECOMPRESSION MICRODISCECTOMY Left 03/23/2013   Procedure: LUMBAR LAMINECTOMY/DECOMPRESSION MICRODISCECTOMY 1 LEVEL;  Surgeon: Eustace Moore, MD;  Location: [facility];  Service: Neurosurgery;   Laterality: Left;  LUMBAR LAMINECTOMY/DECOMPRESSION MICRODISCECTOMY 1 LEVEL  . MASTECTOMY, PARTIAL Left 02/26/2003   ; S/P re-excision of cranial and lateral margins 04/19/2003.   Marland Kitchen RE-EXCISION OF BREAST CANCER,SUPERIOR MARGINS Right 10/27/2012   Procedure: RE-EXCISION OF BREAST CANCER,SUPERIOR and inferior MARGINS;  Surgeon: [name], MD;  Location: [facility];  Service: General;  Laterality: Right;  . RE-EXCISION OF BREAST LUMPECTOMY Left 04/2003  . TEE WITHOUT CARDIOVERSION N/A 04/04/2019   Procedure: Transesophageal Echocardiogram (Tee);  Surgeon: Wonda Olds, MD;  Location: [facility];  Service: Open Heart Surgery;  Laterality: N/A;  . THORACIC AORTIC ANEURYSM REPAIR N/A 04/04/2019   Procedure: THORACIC ASCENDING ANEURYSM REPAIR (AAA)  USING 28 MM X 30 CM HEMASHIELD PLATINUM VASCULAR GRAFT;  Surgeon: Wonda Olds, MD;  Location: [facility];  Service: Open Heart Surgery;  Laterality: N/A;    Social History:   reports that she quit smoking about 15 months ago. Her smoking use included cigarettes. She has a 11.00 pack-year smoking history. She has never used smokeless tobacco. She reports previous alcohol use of about 2.0 standard drinks of alcohol per week. She reports previous drug use. Drug: Cocaine.  Allergies  Allergen Reactions  . Shrimp [Shellfish Allergy] Shortness Of Breath  . Bactroban [Mupirocin] Other (See Comments)    "Sores  in nose"  . Vicodin [Hydrocodone-Acetaminophen] Itching and Nausea And Vomiting    This is patient's home medication  . Eliquis [Apixaban] Rash  . Lisinopril Cough  . Tylenol [Acetaminophen] Itching    Family History  Problem Relation Age of Onset  . Colon cancer Mother 16  . Hypertension Mother   . Diabetes Sister 48  . Hypertension Sister   . Diabetes Brother   . Hypertension Brother   . Diabetes Brother   . Hypertension Brother   . Kidney disease Son        s/p renal transplant  . Hypertension Son   . Diabetes Son   . Multiple sclerosis  Son   . Bone cancer Sister 44  . Breast cancer Neg Hx   . Cervical cancer Neg Hx      Prior to Admission medications   Medication Sig Start Date End Date Taking? Authorizing Provider  albuterol (VENTOLIN HFA) 108 (90 Base) MCG/ACT inhaler Inhale 2 puffs into the lungs every 6 (six) hours as needed for wheezing or shortness of breath. 07/30/20   Maximiano Coss, NP  amLODipine (NORVASC) 10 MG tablet TAKE 1 TABLET (10 MG TOTAL) BY MOUTH DAILY. 09/20/18   Jacelyn Pi, Lilia Argue, MD  cloNIDine (CATAPRES - DOSED IN MG/24 HR) 0.3 mg/24hr patch Place 1 patch (0.3 mg total) onto the skin once a week. 04/22/19   Nani Skillern, PA-C  furosemide (LASIX) 20 MG tablet Take 1 tablet (20 mg total) by mouth daily. 01/05/20   Jacelyn Pi, Lilia Argue, MD  hydrALAZINE (APRESOLINE) 25 MG tablet Take 1 tablet (25 mg total) by mouth 2 (two) times daily. 01/05/20   Daleen Squibb, MD  losartan (COZAAR) 100 MG tablet Take 100 mg by mouth at bedtime. 08/02/19   [provider]  metoprolol succinate (TOPROL-XL) 50 MG 24 hr tablet Take 1 tablet (50 mg total) by mouth daily. Take with or immediately following a meal. 01/05/20   Jacelyn Pi, Lilia Argue, MD  mirtazapine (REMERON) 7.5 MG tablet Take 1 tablet (7.5 mg total) by mouth at bedtime. 07/15/20   Maximiano Coss, NP  ondansetron (ZOFRAN) 4 MG tablet Take 1 tablet (4 mg total) by mouth every 8 (eight) hours as needed for nausea or vomiting. 08/03/19   Jacelyn Pi, Lilia Argue, MD  oxyCODONE-acetaminophen (PERCOCET/ROXICET) 5-325 MG tablet Take 1 tablet by mouth 3 (three) times daily as needed for severe pain (pain.).    [provider]  rosuvastatin (CRESTOR) 10 MG tablet TAKE 1 TABLET (10 MG TOTAL) BY MOUTH DAILY. 07/03/20 10/01/20  Patwardhan, Reynold Bowen, MD  warfarin (COUMADIN) 6 MG tablet TAKE 1 TABLET (6 MG TOTAL) BY MOUTH 4 (FOUR) TIMES A WEEK. 07/04/20   Maximiano Coss, NP  warfarin (COUMADIN) 7.5 MG tablet Take 1 tablet (7.5 mg total) by mouth 3 (three)  times a week. 05/15/20   Maximiano Coss, NP    Physical Exam: Vitals:   09/21/20 1951 09/21/20 2001 09/21/20 2029  BP: (!) 193/109  (!) 171/101  Pulse: 90  90  Resp: 20  16  Temp: 99.7 F (37.6 C)    TempSrc: Oral    SpO2: (!) 87%  95%  Weight:  46.7 kg   Height:  5\' 3"  (1.6 m)     Constitutional: NAD, calm  Eyes: PERTLA, lids and conjunctivae normal ENMT: Mucous membranes are moist. Posterior pharynx clear of any exudate or lesions.   Neck: supple, no masses   Respiratory: Coarse rales, no wheezing.  No accessory muscle use.  Cardiovascular: S1 & S2 heard, regular rate and rhythm. No extremity edema.   Abdomen: No distension, no tenderness, soft. Bowel sounds active.  Musculoskeletal: no clubbing / cyanosis. No joint deformity upper and lower extremities.   Skin: no significant rashes, lesions, ulcers. Warm, dry, well-perfused. Neurologic: CN 2-12 grossly intact. Sensation intact. Moving all extremities.  Psychiatric: Alert and oriented to person, place, and situation. Pleasant and cooperative.    Labs and Imaging on Admission: I have personally reviewed following labs and imaging studies  CBC: Recent Labs  Lab 09/21/20 2021  WBC 8.4  NEUTROABS 7.0  HGB 7.3*  HCT 23.4*  MCV 91.4  PLT 025*   Basic Metabolic Panel: Recent Labs  Lab 09/21/20 2021  NA 141  K 4.1  CL 105  CO2 17*  GLUCOSE 101*  BUN 114*  CREATININE 14.20*  CALCIUM 7.6*   GFR: Estimated Creatinine Clearance: 2.8 mL/min (A) (by C-G formula based on SCr of 14.2 mg/dL (H)). Liver Function Tests: Recent Labs  Lab 09/21/20 2021  AST 32  ALT 27  ALKPHOS 114  BILITOT 0.8  PROT 6.8  ALBUMIN 3.0*   No results for input(s): LIPASE, AMYLASE in the last 168 hours. No results for input(s): AMMONIA in the last 168 hours. Coagulation Profile: Recent Labs  Lab 09/21/20 2021  INR 1.1   Cardiac Enzymes: No results for input(s): CKTOTAL, CKMB, CKMBINDEX, TROPONINI in the last 168 hours. BNP  (last 3 results) No results for input(s): PROBNP in the last 8760 hours. HbA1C: No results for input(s): HGBA1C in the last 72 hours. CBG: No results for input(s): GLUCAP in the last 168 hours. Lipid Profile: No results for input(s): CHOL, HDL, LDLCALC, TRIG, CHOLHDL, LDLDIRECT in the last 72 hours. Thyroid Function Tests: No results for input(s): TSH, T4TOTAL, FREET4, T3FREE, THYROIDAB in the last 72 hours. Anemia Panel: No results for input(s): VITAMINB12, FOLATE, FERRITIN, TIBC, IRON, RETICCTPCT in the last 72 hours. Urine analysis:    Component Value Date/Time   COLORURINE STRAW (A) 09/21/2020 2018   APPEARANCEUR HAZY (A) 09/21/2020 2018   LABSPEC 1.010 09/21/2020 2018   PHURINE 7.0 09/21/2020 2018   GLUCOSEU NEGATIVE 09/21/2020 2018   GLUCOSEU NEG mg/dL 10/30/2009 1944   HGBUR NEGATIVE 09/21/2020 2018   BILIRUBINUR NEGATIVE 09/21/2020 2018   BILIRUBINUR negative 05/04/2018 0910   KETONESUR NEGATIVE 09/21/2020 2018   PROTEINUR 100 (A) 09/21/2020 2018   UROBILINOGEN 0.2 05/04/2018 0910   UROBILINOGEN 1 02/14/2014 0946   NITRITE NEGATIVE 09/21/2020 2018   LEUKOCYTESUR LARGE (A) 09/21/2020 2018   Sepsis Labs: @LABRCNTIP (procalcitonin:4,lacticidven:4) ) Recent Results (from the past 240 hour(s))  Resp Panel by RT-PCR (Flu A&B, Covid) Nasopharyngeal Swab     Status: None   Collection Time: 09/21/20  8:54 PM   Specimen: Nasopharyngeal Swab; Nasopharyngeal(NP) swabs in vial transport medium  Result Value Ref Range Status   SARS Coronavirus 2 by RT PCR NEGATIVE NEGATIVE Final    Comment: (NOTE) SARS-CoV-2 target nucleic acids are NOT DETECTED.  The SARS-CoV-2 RNA is generally detectable in upper respiratory specimens during the acute phase of infection. The lowest concentration of SARS-CoV-2 viral copies this assay can detect is 138 copies/mL. A negative result does not preclude SARS-Cov-2 infection and should not be used as the sole basis for treatment or other patient  management decisions. A negative result may occur with  improper specimen collection/handling, submission of specimen other than nasopharyngeal swab, presence of viral mutation(s) within the areas targeted by  this assay, and inadequate number of viral copies(<138 copies/mL). A negative result must be combined with clinical observations, patient history, and epidemiological information. The expected result is Negative.  Fact Sheet for Patients:  EntrepreneurPulse.com.au  Fact Sheet for Healthcare Providers:  IncredibleEmployment.be  This test is no t yet approved or cleared by the [place] FDA and  has been authorized for detection and/or diagnosis of SARS-CoV-2 by FDA under an Emergency Use Authorization (EUA). This EUA will remain  in effect (meaning this test can be used) for the duration of the COVID-19 declaration under Section 564(b)(1) of the Act, 21 U.S.C.section 360bbb-3(b)(1), unless the authorization is terminated  or revoked sooner.       Influenza A by PCR NEGATIVE NEGATIVE Final   Influenza B by PCR NEGATIVE NEGATIVE Final    Comment: (NOTE) The Xpert Xpress SARS-CoV-2/FLU/RSV plus assay is intended as an aid in the diagnosis of influenza from Nasopharyngeal swab specimens and should not be used as a sole basis for treatment. Nasal washings and aspirates are unacceptable for Xpert Xpress SARS-CoV-2/FLU/RSV testing.  Fact Sheet for Patients: EntrepreneurPulse.com.au  Fact Sheet for Healthcare Providers: IncredibleEmployment.be  This test is not yet approved or cleared by the [place] FDA and has been authorized for detection and/or diagnosis of SARS-CoV-2 by FDA under an Emergency Use Authorization (EUA). This EUA will remain in effect (meaning this test can be used) for the duration of the COVID-19 declaration under Section 564(b)(1) of the Act, 21 U.S.C. section 360bbb-3(b)(1),  unless the authorization is terminated or revoked.  Performed at [facility], [address], [place], [place] [address]      Radiological Exams on Admission: DG Chest Port 1 View  Result Date: 09/21/2020 CLINICAL DATA:  Shortness of breath, fever EXAM: PORTABLE CHEST 1 VIEW COMPARISON:  Chest radiograph dated 11/30/2019 FINDINGS: The heart is enlarged. Mild-to-moderate bilateral lower lung predominant airspace opacities are noted. A small left pleural effusion may contribute. There is no right pleural effusion. There is no pneumothorax. Degenerative changes are seen in the spine and right shoulder. IMPRESSION: 1. Mild-to-moderate bilateral lower lung predominant airspace opacities likely represent pneumonia. A small left pleural effusion may contribute. 2. Cardiomegaly. Electronically Signed   By: Zerita Boers M.D.   On: 09/21/2020 20:53    EKG: Independently reviewed. Sinus rhythm, sinus arrhythmia.   Assessment/Plan   1. Acute renal failure superimposed on CKD V; uremia; metabolic acidosis  - Patient with CKD V and LUE AVF not requiring HD for the past yr presents with progressive orthopnea, loss of appetite, pleuritic chest pain and productive cough and is found to have BUN 114, SCr 14.20, bicarbonate 17, BP 190/110, no peripheral edema  - Appreciate nephrology consultant, planning for HD   2. Acute hypoxic respiratory failure; pneumonia  - Saturating mid-upper 80s on rm air at rest, now on 2 Lpm  - Started on [place] and azithromycin in ED  - Check strep pneumo and legionella antigens, trend procalcitonin, continue current antibiotics, continue supplemental O2 as needed    3. Acute on chronic diastolic CHF  - Presents with cough, pleuritic pain, and orthopnea - EF was preserved in March 2021  - Nephrology planning for HD tonight, restrict fluids, continue beta-blocker, monitor weight and I/Os    4. Paroxysmal atrial fibrillation   - In sinus rhythm in ED  - CHADS-VASc  at least 6 (CVA x2, age, gender, CHF, HTN)  - She reports still taking warfarin but INR 1.1  -  Use IV heparin for now and consider resuming warfarin or starting Eliquis    5. Chest pain; elevated troponin  - Presents with one wk pleuritic pain with productive cough likely related pneumonia and/or edema; HS troponin is 120, a little higher than priors; EKG poor quality, plan to repeat  - Repeat EKG, trend troponin, continue statin and beta-blocker, and treat underlying hypervolemia and possible PNA    6. Hypertensive urgency  - BP as high as 190/110 in ED  - Anticipated improvement with HD  - Continue Norvasc, metoprolol, and hydralazine with additional hydralazine as needed    7. Anemia - Hgb is 7.3, down from 8.1 in December 2021  - No overt bleeding, likely d/t CKD  - Check anemia panel, monitor   8. History of CVA  - Continue statin     DVT prophylaxis: IV heparin   Code Status: Full  Level of Care: Level of care: Progressive Family Communication: None present  Disposition Plan:  Patient is from: Home  Anticipated d/c is to: TBD Anticipated d/c date is: 09/25/20 Patient currently: pending dialysis, cardiology consultation  Consults called: Nephrology and cardiology consulted by ED physician   Admission status: Inpatient    Vianne Bulls, MD [facility]  09/21/2020, 11:13 PM

## 2020-09-21 NOTE — ED Provider Notes (Signed)
[facility] EMERGENCY DEPARTMENT Provider Note   CSN: [id number] Arrival date & time: 09/21/20  1949     History Chief Complaint  Patient presents with  . Chest Pain    Monique Henderson is a 70 y.o. female.  Patient with medical history of intermittent atrial fibrillation aortic disease chronic kidney disease, presents with fever cough and shortness of breath ongoing for 1 week and worsening.  She saw her primary care doctor and was continuing outpatient treatment, however feels like her symptoms are worsened in the last 2 to 3 days and presents to the ER.  Denies any chest pain at this time but every time she coughs she feels mid chest discomfort.  Fevers are subjective at home.  Denies vomiting or diarrhea.        Past Medical History:  Diagnosis Date  . AF (paroxysmal atrial fibrillation) ([place]) 05/29/2019  . Aortic atherosclerosis ([place]) 07/05/2019  . Aortic dissection ([place]) 04/04/2019  . Atelectasis 2002   Bilateral  . Atrial fibrillation with RVR ([place]) 05/18/2019  . Atypical chest pain 07/05/2019  . Bone spur 2008   Right calcaneal foot spur  . Breast cancer ([place]) 2004   Ductal carcinoma in situ of the left breast; S/P left partial mastectomy 02/26/2003; S/P re-excision of cranial and lateral margins11/18/2004.radiation  . Breast cancer ([place]) 09/21/2012   right breast/ last radiation treatment 03/22/2013  . Cerebral thrombosis with cerebral infarction 05/22/2019  . Chronic kidney disease, stage IV (severe) ([place]) 10/10/2007  . Chronic low back pain 06/22/2016  . Chronic obstructive lung disease ([place]) 01/16/2017  . CKD (chronic kidney disease), stage IV ([place]) 07/19/2017  . 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 Autumn Messing; S/P re-excision of superior and inferior margins 10/27/2012.   . Dissection of aorta ([place]) 04/03/2019  . Elevated troponin 08/28/2019  . ESRD on dialysis ([place]) 05/29/2019  . Essential  hypertension 09/16/2006  . GERD 09/16/2006  . GERD (gastroesophageal reflux disease)   . H/O: stroke 05/29/2019  . 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 Hot flashes   . Hx of radiation therapy 2005   left breast  . Hx of radiation therapy 01/11/13- 03/22/13   right breast 5760 cGy 30 sessions  . Hyperkalemia 08/28/2019  . Hypertension   . Insomnia 03/14/2015  . Low back pain   . Lumbar spinal stenosis    S/P lumbar decompressive laminectomy, fusion and plating for lumbar spinal stensosis  . Malnutrition of moderate degree 05/19/2019  . Non compliance with medical treatment 12/04/2017  . Normocytic anemia    With thrombocytosis  . On Coumadin for atrial fibrillation ([place]) 10/03/2019  . Osteoarthritis   . Paroxysmal SVT (supraventricular tachycardia) ([place]) 08/27/2019  . Personal history of radiation therapy   . Pleural effusion 05/18/2019  . Positive D dimer 08/28/2019  . Right ureteral stone 2002  . S/P aortic aneurysm repair 04/07/2019  . S/P lumbar spinal fusion 01/18/2014   S/P lumbar decompressive laminectomy, fusion, and plating for lumbar spinal stenosis on 05/27/2009 by Dr. Eustace Moore.  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 Eustace Moore.    . Shortness of breath    from pain  . SIRS (systemic inflammatory response syndrome) ([place]) 08/27/2019  .  SVT (supraventricular tachycardia) ([place]) 08/28/2019  . Tobacco use disorder 04/19/2009  . Uterine fibroid   . Wears dentures    top    Patient Active Problem List   Diagnosis Date Noted  . Acute respiratory failure with hypoxia ([place]) 09/21/2020  . On Coumadin for atrial fibrillation ([place]) 10/03/2019  . Elevated troponin 08/28/2019  . Positive D dimer 08/28/2019  . Hyperkalemia 08/28/2019  . SVT  (supraventricular tachycardia) ([place]) 08/28/2019  . SIRS (systemic inflammatory response syndrome) ([place]) 08/27/2019  . Paroxysmal SVT (supraventricular tachycardia) ([place]) 08/27/2019  . Aortic atherosclerosis ([place]) 07/05/2019  . Atypical chest pain 07/05/2019  . H/O: stroke 05/29/2019  . AF (paroxysmal atrial fibrillation) ([place]) 05/29/2019  . ESRD on dialysis ([place]) 05/29/2019  . Cerebral thrombosis with cerebral infarction 05/22/2019  . Malnutrition of moderate degree 05/19/2019  . Pleural effusion 05/18/2019  . Atrial fibrillation with RVR ([place]) 05/18/2019  . S/P aortic aneurysm repair 04/07/2019  . Aortic dissection ([place]) 04/04/2019  . Dissection of aorta ([place]) 04/03/2019  . Non compliance with medical treatment 12/04/2017  . CKD (chronic kidney disease), stage IV ([place]) 07/19/2017  . Chronic obstructive lung disease ([place]) 01/16/2017  . Chronic low back pain 06/22/2016  . Insomnia 03/14/2015  . S/P lumbar spinal fusion 01/18/2014  . Tobacco use disorder 04/19/2009  . Essential hypertension 09/16/2006  . GERD 09/16/2006    Past Surgical History:  Procedure Laterality Date  . ANTERIOR LAT LUMBAR FUSION N/A 01/18/2014   Procedure: ANTERIOR LATERAL LUMBAR FUSION LUMBAR TWO-THREE;  Surgeon: Eustace Moore, MD;  Location: [facility];  Service: Neurosurgery;  Laterality: N/A;  . ANTERIOR LUMBAR FUSION  01/18/2014  . AV FISTULA PLACEMENT Left 04/20/2019   Procedure: ARTERIOVENOUS (AV) FISTULA CREATION;  Surgeon: [name], MD;  Location: [facility];  Service: Vascular;  Laterality: Left;  . BACK SURGERY    . BREAST LUMPECTOMY Left 01/2003  . BREAST LUMPECTOMY Right 2014  . BREAST LUMPECTOMY WITH NEEDLE LOCALIZATION AND AXILLARY SENTINEL LYMPH NODE BX Right 10/13/2012   Procedure: BREAST LUMPECTOMY WITH NEEDLE LOCALIZATION;  Surgeon: [name], MD;  Location: [facility];  Service: General;  Laterality: Right;  Right breast wire localized lumpectomy  .  INSERTION OF DIALYSIS CATHETER Right 04/20/2019   Procedure: INSERTION OF DIALYSIS CATHETER, right internal jugular;  Surgeon: [name], MD;  Location: [facility];  Service: Vascular;  Laterality: Right;  . IR THORACENTESIS ASP PLEURAL SPACE W/IMG GUIDE  05/19/2019  . LAMINECTOMY  05/27/2009   Lumbar decompressive laminectomy, fusion and plating for lumbar spinal stensosis  . LUMBAR LAMINECTOMY/DECOMPRESSION MICRODISCECTOMY Left 03/23/2013   Procedure: LUMBAR LAMINECTOMY/DECOMPRESSION MICRODISCECTOMY 1 LEVEL;  Surgeon: Eustace Moore, MD;  Location: [facility];  Service: Neurosurgery;  Laterality: Left;  LUMBAR LAMINECTOMY/DECOMPRESSION MICRODISCECTOMY 1 LEVEL  . MASTECTOMY, PARTIAL Left 02/26/2003   ; S/P re-excision of cranial and lateral margins 04/19/2003.   [place] [facility] RE-EXCISION OF BREAST CANCER,SUPERIOR MARGINS Right 10/27/2012   Procedure: RE-EXCISION OF BREAST CANCER,SUPERIOR and inferior MARGINS;  Surgeon: [name], MD;  Location: [facility];  Service: General;  Laterality: Right;  . RE-EXCISION OF BREAST LUMPECTOMY Left 04/2003  . TEE WITHOUT CARDIOVERSION N/A 04/04/2019   Procedure: Transesophageal Echocardiogram ([place]);  Surgeon: Wonda Olds, MD;  Location: [facility];  Service: Open Heart Surgery;  Laterality: N/A;  . THORACIC AORTIC ANEURYSM REPAIR N/A 04/04/2019   Procedure: THORACIC ASCENDING ANEURYSM REPAIR (AAA)  USING 28 MM X 30 CM HEMASHIELD PLATINUM VASCULAR  GRAFT;  Surgeon: Wonda Olds, MD;  Location: [facility];  Service: Open Heart Surgery;  Laterality: N/A;     OB History    Gravida  2   Para  2   Term  1   Preterm  1   AB      Living        SAB      IAB      Ectopic      Multiple      Live Births              Family History  Problem Relation Age of Onset  . Colon cancer Mother 6  . Hypertension Mother   . Diabetes Sister 77  . Hypertension Sister   . Diabetes Brother   . Hypertension Brother   . Diabetes Brother   . Hypertension  Brother   . Kidney disease Son        s/p renal transplant  . Hypertension Son   . Diabetes Son   . Multiple sclerosis Son   . Bone cancer Sister 58  . Breast cancer Neg Hx   . Cervical cancer Neg Hx     Social History   Tobacco Use  . Smoking status: Former Smoker    Packs/day: 0.25    Years: 44.00    Pack years: 11.00    Types: Cigarettes    Quit date: 05/24/2019    Years since quitting: 1.3  . Smokeless tobacco: Never Used  . Tobacco comment: smoking less  Vaping Use  . Vaping Use: Never used  Substance Use Topics  . Alcohol use: Not Currently    Alcohol/week: 2.0 standard drinks    Types: 2 Cans of beer per week    Comment:  "couple beers a weekend"  . Drug use: Not Currently    Types: Cocaine    Comment: "stopped back in the 1980's"    Home Medications Prior to Admission medications   Medication Sig Start Date End Date Taking? Authorizing Provider  albuterol (VENTOLIN HFA) 108 (90 Base) MCG/ACT inhaler Inhale 2 puffs into the lungs every 6 (six) hours as needed for wheezing or shortness of breath. 07/30/20   Maximiano Coss, NP  amLODipine (NORVASC) 10 MG tablet TAKE 1 TABLET (10 MG TOTAL) BY MOUTH DAILY. 09/20/18   Jacelyn Pi, Lilia Argue, MD  cloNIDine (CATAPRES - DOSED IN MG/24 HR) 0.3 mg/24hr patch Place 1 patch (0.3 mg total) onto the skin once a week. 04/22/19   Nani Skillern, PA-C  furosemide (LASIX) 20 MG tablet Take 1 tablet (20 mg total) by mouth daily. 01/05/20   Jacelyn Pi, Lilia Argue, MD  hydrALAZINE (APRESOLINE) 25 MG tablet Take 1 tablet (25 mg total) by mouth 2 (two) times daily. 01/05/20   Daleen Squibb, MD  losartan (COZAAR) 100 MG tablet Take 100 mg by mouth at bedtime. 08/02/19   [provider]  metoprolol succinate (TOPROL-XL) 50 MG 24 hr tablet Take 1 tablet (50 mg total) by mouth daily. Take with or immediately following a meal. 01/05/20   Jacelyn Pi, Lilia Argue, MD  mirtazapine (REMERON) 7.5 MG tablet Take 1 tablet (7.5 mg total) by  mouth at bedtime. 07/15/20   Maximiano Coss, NP  ondansetron (ZOFRAN) 4 MG tablet Take 1 tablet (4 mg total) by mouth every 8 (eight) hours as needed for nausea or vomiting. 08/03/19   Daleen Squibb, MD  oxyCODONE-acetaminophen (PERCOCET/ROXICET) 5-325 MG tablet Take 1 tablet by  mouth 3 (three) times daily as needed for severe pain (pain.).    [provider]  rosuvastatin (CRESTOR) 10 MG tablet TAKE 1 TABLET (10 MG TOTAL) BY MOUTH DAILY. 07/03/20 10/01/20  Patwardhan, Reynold Bowen, MD  warfarin (COUMADIN) 6 MG tablet TAKE 1 TABLET (6 MG TOTAL) BY MOUTH 4 (FOUR) TIMES A WEEK. 07/04/20   Maximiano Coss, NP  warfarin (COUMADIN) 7.5 MG tablet Take 1 tablet (7.5 mg total) by mouth 3 (three) times a week. 05/15/20   Maximiano Coss, NP    Allergies    Shrimp [shellfish allergy], Bactroban [mupirocin], Vicodin [hydrocodone-acetaminophen], Eliquis [apixaban], Lisinopril, and Tylenol [acetaminophen]  Review of Systems   Review of Systems  Constitutional: Positive for fever.  HENT: Negative for ear pain.   Eyes: Negative for pain.  Respiratory: Positive for cough and shortness of breath.   Cardiovascular: Positive for chest pain.  Gastrointestinal: Negative for abdominal pain.  Genitourinary: Negative for flank pain.  Musculoskeletal: Negative for back pain.  Skin: Negative for rash.  Neurological: Negative for headaches.    Physical Exam Updated Vital Signs BP (!) 171/101   Pulse 90   Temp 99.7 F (37.6 C) (Oral)   Resp 16   Ht 5\' 3"  (1.6 m)   Wt 46.7 kg   SpO2 95%   BMI 18.24 kg/m   Physical Exam Constitutional:      General: She is not in acute distress.    Appearance: Normal appearance.  HENT:     Head: Normocephalic.     Nose: Nose normal.  Eyes:     Extraocular Movements: Extraocular movements intact.  Cardiovascular:     Rate and Rhythm: Normal rate.  Pulmonary:     Effort: Tachypnea, accessory muscle usage and respiratory distress present.     Breath sounds: No  wheezing.  Musculoskeletal:        General: Normal range of motion.     Cervical back: Normal range of motion.  Neurological:     General: No focal deficit present.     Mental Status: She is alert. Mental status is at baseline.     ED Results / Procedures / Treatments   Labs (all labs ordered are listed, but only abnormal results are displayed) Labs Reviewed  COMPREHENSIVE METABOLIC PANEL - Abnormal; Notable for the following components:      Result Value   CO2 17 (*)    Glucose, Bld 101 (*)    BUN 114 (*)    Creatinine, Ser 14.20 (*)    Calcium 7.6 (*)    Albumin 3.0 (*)    GFR, Estimated 3 (*)    Anion gap 19 (*)    All other components within normal limits  CBC WITH DIFFERENTIAL/PLATELET - Abnormal; Notable for the following components:   RBC 2.56 (*)    Hemoglobin 7.3 (*)    HCT 23.4 (*)    RDW 16.6 (*)    Platelets 411 (*)    Lymphs Abs 0.6 (*)    All other components within normal limits  APTT - Abnormal; Notable for the following components:   aPTT 40 (*)    All other components within normal limits  URINALYSIS, ROUTINE W REFLEX MICROSCOPIC - Abnormal; Notable for the following components:   Color, Urine STRAW (*)    APPearance HAZY (*)    Protein, ur 100 (*)    Leukocytes,Ua LARGE (*)    All other components within normal limits  TROPONIN I (HIGH SENSITIVITY) - Abnormal; Notable for the  following components:   Troponin I (High Sensitivity) 120 (*)    All other components within normal limits  RESP PANEL BY RT-PCR (FLU A&B, COVID) ARPGX2  CULTURE, BLOOD (SINGLE)  URINE CULTURE  LACTIC ACID, PLASMA  PROTIME-INR  BRAIN NATRIURETIC PEPTIDE  PROCALCITONIN  PROCALCITONIN  TROPONIN I (HIGH SENSITIVITY)    EKG Sinus rhythm, no ST elevations, no ST depressions, heart rate of 95 bpm.  Radiology DG Chest Port 1 View  Result Date: 09/21/2020 CLINICAL DATA:  Shortness of breath, fever EXAM: PORTABLE CHEST 1 VIEW COMPARISON:  Chest radiograph dated 11/30/2019  FINDINGS: The heart is enlarged. Mild-to-moderate bilateral lower lung predominant airspace opacities are noted. A small left pleural effusion may contribute. There is no right pleural effusion. There is no pneumothorax. Degenerative changes are seen in the spine and right shoulder. IMPRESSION: 1. Mild-to-moderate bilateral lower lung predominant airspace opacities likely represent pneumonia. A small left pleural effusion may contribute. 2. Cardiomegaly. Electronically Signed   By: Zerita Boers M.D.   On: 09/21/2020 20:53    Procedures .Critical Care E&M Performed by: Luna Fuse, MD  Critical care provider statement:    Critical care time (minutes):  30   Critical care time was exclusive of:  Separately billable procedures and treating other patients   Critical care was necessary to treat or prevent imminent or life-threatening deterioration of the following conditions:  Respiratory failure and hepatic failure After initial E/M assessment, critical care services were subsequently performed that were exclusive of separately billable procedures or treatment.       Medications Ordered in ED Medications  cefTRIAXone (ROCEPHIN) 1 g in sodium chloride 0.9 % 100 mL IVPB (has no administration in time range)  azithromycin (ZITHROMAX) 500 mg in sodium chloride 0.9 % 250 mL IVPB (has no administration in time range)    ED Course  I have reviewed the triage vital signs and the nursing notes.  Pertinent labs & imaging results that were available during my care of the patient were reviewed by me and considered in my medical decision making (see chart for details).    MDM Rules/Calculators/A&P                          EKG shows sinus rhythm no ST elevations depressions noted.  Labs show normal white count, creatinine significant elevated at 14.2 BUN of 114.  Patient states that she was on dialysis a few years ago but it only lasted for about 3 months and has been off dialysis since  then.  Hemoglobin appears low at 7.3 which appears to be close to her regular baseline.  Chest x-ray per radiologist concerning for pneumonia.  Troponin elevated 120.  Patient given aspirin, given azithromycin and Rocephin given her persistent hypoxemia, will be brought into the hospitalist team.  Consultation placed to nephrology and cardiology, both of whom will see the patient.  Final Clinical Impression(s) / ED Diagnoses Final diagnoses:  Community acquired pneumonia, unspecified laterality  Acute renal failure superimposed on chronic kidney disease, unspecified CKD stage, unspecified acute renal failure type ([place])  Elevated troponin  Hypoxemia    Rx / DC Orders ED Discharge Orders    None       Luna Fuse, MD 09/21/20 [id number]

## 2020-09-21 NOTE — ED Triage Notes (Signed)
The pt arrived  By [facility] from home  Pt is c/o chest pain productive cough with a temp for approx one week   Ems arrived initial pulse was 160 then went right down to 90 nasal 02 by gems iv per ems

## 2020-09-21 NOTE — Consult Note (Signed)
Cardiology Consultation:   Patient ID: Monique Henderson MRN: [id number]; DOB: 1950-11-04  Admit date: 09/21/2020 Date of Consult: 09/21/2020  PCP:  Maximiano Coss, NP   [place]  Cardiologist:  No primary care provider on file.  Advanced Practice Provider:  No care team member to display Electrophysiologist:  None        Patient Profile:   Monique Henderson is a 70 y.o. 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  History of Present Illness:  Monique Henderson is a 70 y.o. 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 It is associated with SOB, orthopnea, PND, no LE edema and SOB with minimal ambulation. Still makes lot of urine. Also started having fever and increased phlegm as well as chest pain whlie coughing, worse with respirations, movement and goes away with laying down. ER- she was noted to by hypoxic 87% placed on oxygen, BP  193/109, CXR- b/l arispace dx-pNA, left pleural effusion. Chemistry panel features a bicarbonate of 17, BUN 114, and creatinine 14.2.  CBC notable for hemoglobin of 7.3.  Lactic acid is normal, INR 1.1, and troponin 120.  Cardiology and nephrology were consulted by the ED physician.  EKG: NSR with PACs Trop 120->141 BNP 3659  Prior ECHO: 08/28/19 1. Left ventricular ejection  fraction, by estimation, is 50 to 55%. The  left ventricle has low normal function. The left ventricle has no regional  wall motion abnormalities. There is moderate left ventricular hypertrophy.  Left ventricular diastolic  parameters are consistent with Grade II diastolic dysfunction  (pseudonormalization).  2. Right ventricular systolic function is normal. The right ventricular  size is normal. There is moderately elevated pulmonary artery systolic  pressure.  3. Moderate left atrial dilatation. Mild right atrial dilatation.  4. Mild aortic sclerosis.  5. Mild-mod mitral regurgitation. Moderate tricuspid regurgitation.  Estimated PASP 47 mmHg.  6. Trivial pericardial effusion. Large left pleural effusion.  7. Compared to previous study on 05/20/2020, pleural effusion is new.    Past Medical History:  Diagnosis Date  . AF (paroxysmal atrial fibrillation) ([place]) 05/29/2019  . Aortic atherosclerosis ([place]) 07/05/2019  . Aortic dissection ([place]) 04/04/2019  . Atelectasis 2002   Bilateral  . Atrial fibrillation with RVR ([place]) 05/18/2019  . Atypical chest pain 07/05/2019  . Bone spur 2008   Right calcaneal foot spur  . Breast cancer ([place]) 2004   Ductal carcinoma in situ of the left breast; S/P left partial mastectomy 02/26/2003; S/P re-excision of cranial and lateral margins11/18/2004.radiation  . Breast cancer ([place]) 09/21/2012   right breast/ last radiation treatment 03/22/2013  . Cerebral thrombosis with cerebral infarction 05/22/2019  . Chronic kidney disease, stage IV (severe) ([place]) 10/10/2007  . Chronic low back pain 06/22/2016  . Chronic obstructive lung disease ([place]) 01/16/2017  . CKD (chronic  kidney disease), stage IV ([place]) 07/19/2017  . 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 Autumn Messing; S/P re-excision of superior and inferior margins 10/27/2012.   . Dissection of aorta ([place]) 04/03/2019  . Elevated troponin 08/28/2019  .  ESRD on dialysis ([place]) 05/29/2019  . Essential hypertension 09/16/2006  . GERD 09/16/2006  . GERD (gastroesophageal reflux disease)   . H/O: stroke 05/29/2019  . 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 Hot flashes   . Hx of radiation therapy 2005   left breast  . Hx of radiation therapy 01/11/13- 03/22/13   right breast 5760 cGy 30 sessions  . Hyperkalemia 08/28/2019  . Hypertension   . Insomnia 03/14/2015  . Low back pain   . Lumbar spinal stenosis    S/P lumbar decompressive laminectomy, fusion and plating for lumbar spinal stensosis  . Malnutrition of moderate degree 05/19/2019  . Non compliance with medical treatment 12/04/2017  . Normocytic anemia    With thrombocytosis  . On Coumadin for atrial fibrillation ([place]) 10/03/2019  . Osteoarthritis   . Paroxysmal SVT (supraventricular tachycardia) ([place]) 08/27/2019  . Personal history of radiation therapy   . Pleural effusion 05/18/2019  . Positive D dimer 08/28/2019  . Right ureteral stone 2002  . S/P aortic aneurysm repair 04/07/2019  . S/P lumbar spinal fusion 01/18/2014   S/P lumbar decompressive laminectomy, fusion, and plating for lumbar spinal stenosis on 05/27/2009 by Dr. Eustace Moore.  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 Eustace Moore.    . Shortness of breath    from pain  . SIRS (systemic inflammatory response syndrome) ([place]) 08/27/2019  . SVT (supraventricular tachycardia) ([place]) 08/28/2019  . Tobacco use disorder 04/19/2009  . Uterine fibroid   . Wears dentures    top    Past Surgical History:  Procedure Laterality Date  . ANTERIOR LAT LUMBAR FUSION N/A 01/18/2014   Procedure: ANTERIOR LATERAL LUMBAR FUSION LUMBAR TWO-THREE;  Surgeon: Eustace Moore, MD;  Location: [place] [facility];  Service:  Neurosurgery;  Laterality: N/A;  . ANTERIOR LUMBAR FUSION  01/18/2014  . AV FISTULA PLACEMENT Left 04/20/2019   Procedure: ARTERIOVENOUS (AV) FISTULA CREATION;  Surgeon: [name], MD;  Location: [place];  Service: Vascular;  Laterality: Left;  . BACK SURGERY    . BREAST LUMPECTOMY Left 01/2003  . BREAST LUMPECTOMY Right 2014  . BREAST LUMPECTOMY WITH NEEDLE LOCALIZATION AND AXILLARY SENTINEL LYMPH NODE BX Right 10/13/2012   Procedure: BREAST LUMPECTOMY WITH NEEDLE LOCALIZATION;  Surgeon: [name], MD;  Location: [place];  Service: General;  Laterality: Right;  Right breast wire localized lumpectomy  . INSERTION OF DIALYSIS CATHETER Right 04/20/2019   Procedure: INSERTION OF DIALYSIS CATHETER, right internal jugular;  Surgeon: [name], MD;  Location: [place];  Service: Vascular;  Laterality: Right;  . IR THORACENTESIS ASP PLEURAL SPACE W/IMG GUIDE  05/19/2019  . LAMINECTOMY  05/27/2009   Lumbar decompressive laminectomy, fusion and plating for lumbar spinal stensosis  . LUMBAR LAMINECTOMY/DECOMPRESSION MICRODISCECTOMY Left 03/23/2013   Procedure: LUMBAR LAMINECTOMY/DECOMPRESSION MICRODISCECTOMY 1 LEVEL;  Surgeon: Eustace Moore, MD;  Location: [place] [facility];  Service: Neurosurgery;  Laterality: Left;  LUMBAR LAMINECTOMY/DECOMPRESSION MICRODISCECTOMY 1 LEVEL  . MASTECTOMY,  PARTIAL Left 02/26/2003   ; S/P re-excision of cranial and lateral margins 04/19/2003.   Marland Kitchen RE-EXCISION OF BREAST CANCER,SUPERIOR MARGINS Right 10/27/2012   Procedure: RE-EXCISION OF BREAST CANCER,SUPERIOR and inferior MARGINS;  Surgeon: [name], MD;  Location: [place];  Service: General;  Laterality: Right;  . RE-EXCISION OF BREAST LUMPECTOMY Left 04/2003  . TEE WITHOUT CARDIOVERSION N/A 04/04/2019   Procedure: Transesophageal Echocardiogram ([place]);  Surgeon: Wonda Olds, MD;  Location: [place];  Service: Open Heart Surgery;  Laterality: N/A;  . THORACIC AORTIC ANEURYSM  REPAIR N/A 04/04/2019   Procedure: THORACIC ASCENDING ANEURYSM REPAIR (AAA)  USING 28 MM X 30 CM HEMASHIELD PLATINUM VASCULAR GRAFT;  Surgeon: Wonda Olds, MD;  Location: [facility];  Service: Open Heart Surgery;  Laterality: N/A;     Home Medications:  Prior to Admission medications   Medication Sig Start Date End Date Taking? Authorizing Provider  albuterol (VENTOLIN HFA) 108 (90 Base) MCG/ACT inhaler Inhale 2 puffs into the lungs every 6 (six) hours as needed for wheezing or shortness of breath. 07/30/20   Maximiano Coss, NP  amLODipine (NORVASC) 10 MG tablet TAKE 1 TABLET (10 MG TOTAL) BY MOUTH DAILY. 09/20/18   Jacelyn Pi, Lilia Argue, MD  cloNIDine (CATAPRES - DOSED IN MG/24 HR) 0.3 mg/24hr patch Place 1 patch (0.3 mg total) onto the skin once a week. 04/22/19   Nani Skillern, PA-C  furosemide (LASIX) 20 MG tablet Take 1 tablet (20 mg total) by mouth daily. 01/05/20   Jacelyn Pi, Lilia Argue, MD  hydrALAZINE (APRESOLINE) 25 MG tablet Take 1 tablet (25 mg total) by mouth 2 (two) times daily. 01/05/20   Daleen Squibb, MD  losartan (COZAAR) 100 MG tablet Take 100 mg by mouth at bedtime. 08/02/19   [provider]  metoprolol succinate (TOPROL-XL) 50 MG 24 hr tablet Take 1 tablet (50 mg total) by mouth daily. Take with or immediately following a meal. 01/05/20   Jacelyn Pi, Lilia Argue, MD  mirtazapine (REMERON) 7.5 MG tablet Take 1 tablet (7.5 mg total) by mouth at bedtime. 07/15/20   Maximiano Coss, NP  ondansetron (ZOFRAN) 4 MG tablet Take 1 tablet (4 mg total) by mouth every 8 (eight) hours as needed for nausea or vomiting. 08/03/19   Jacelyn Pi, Lilia Argue, MD  oxyCODONE-acetaminophen (PERCOCET/ROXICET) 5-325 MG tablet Take 1 tablet by mouth 3 (three) times daily as needed for severe pain (pain.).    [provider]  rosuvastatin (CRESTOR) 10 MG tablet TAKE 1 TABLET (10 MG TOTAL) BY MOUTH DAILY. 07/03/20 10/01/20  Patwardhan, Reynold Bowen, MD  warfarin (COUMADIN) 6 MG tablet TAKE 1  TABLET (6 MG TOTAL) BY MOUTH 4 (FOUR) TIMES A WEEK. 07/04/20   Maximiano Coss, NP  warfarin (COUMADIN) 7.5 MG tablet Take 1 tablet (7.5 mg total) by mouth 3 (three) times a week. 05/15/20   Maximiano Coss, NP    Inpatient Medications: Scheduled Meds:  Continuous Infusions: . azithromycin (ZITHROMAX) 500 MG IVPB (Vial-Mate [employer]or)    . cefTRIAXone (ROCEPHIN)  IV     PRN Meds:   Allergies:    Allergies  Allergen Reactions  . Shrimp [Shellfish Allergy] Shortness Of Breath  . Bactroban [Mupirocin] Other (See Comments)    "Sores in nose"  . Vicodin [Hydrocodone-Acetaminophen] Itching and Nausea And Vomiting    This is patient's home medication  . Eliquis [Apixaban] Rash  . Lisinopril Cough  . Tylenol [Acetaminophen] Itching    Social History:  Social History   Socioeconomic History  . Marital status: Single    Spouse name: Not on file  . Number of children: 2  . Years of education: Not on file  . Highest education level: Not on file  Occupational History  . Not on file  Tobacco Use  . Smoking status: Former Smoker    Packs/day: 0.25    Years: 44.00    Pack years: 11.00    Types: Cigarettes    Quit date: 05/24/2019    Years since quitting: 1.3  . Smokeless tobacco: Never Used  . Tobacco comment: smoking less  Vaping Use  . Vaping Use: Never used  Substance and Sexual Activity  . Alcohol use: Not Currently    Alcohol/week: 2.0 standard drinks    Types: 2 Cans of beer per week    Comment:  "couple beers a weekend"  . Drug use: Not Currently    Types: Cocaine    Comment: "stopped back in the 1980's"  . Sexual activity: Yes    Birth control/protection: Post-menopausal  Other Topics Concern  . Not on file  Social History Narrative  . Not on file   Social Determinants of Health   Financial Resource Strain: Not on file  Food Insecurity: Not on file  Transportation Needs: Not on file  Physical Activity: Not on file  Stress: Not on file  Social Connections:  Not on file  Intimate Partner Violence: Not on file    Family History:    Family History  Problem Relation Age of Onset  . Colon cancer Mother 43  . Hypertension Mother   . Diabetes Sister 74  . Hypertension Sister   . Diabetes Brother   . Hypertension Brother   . Diabetes Brother   . Hypertension Brother   . Kidney disease Son        s/p renal transplant  . Hypertension Son   . Diabetes Son   . Multiple sclerosis Son   . Bone cancer Sister 14  . Breast cancer Neg Hx   . Cervical cancer Neg Hx      ROS:  Please see the history of present illness.   All other ROS reviewed and negative.     Physical Exam/Data:   Vitals:   09/21/20 1951 09/21/20 2001 09/21/20 2029  BP: (!) 193/109  (!) 171/101  Pulse: 90  90  Resp: 20  16  Temp: 99.7 F (37.6 C)    TempSrc: Oral    SpO2: (!) 87%  95%  Weight:  46.7 kg   Height:  5\' 3"  (1.6 m)    No intake or output data in the 24 hours ending 09/21/20 2255 Last 3 Weights 09/21/2020 05/13/2020 01/05/2020  Weight (lbs) 102 lb 15.3 oz 103 lb 103 lb  Weight (kg) 46.7 kg 46.72 kg 46.72 kg     Body mass index is 18.24 kg/m.  General:  Well nourished, well developed, in respiratory distress HEENT: normal Lymph: no adenopathy Neck: JVD up++ Endocrine:  No thryomegaly Vascular: No carotid bruits; FA pulses 2+ bilaterally without bruits  Cardiac:  Tachycardic, no murmur or rub noted Lungs:  Mild wheezing and decreased breath sounds at bases Abd: soft, nontender, no hepatomegaly  Ext: no edema Musculoskeletal:  No deformities, BUE and BLE strength normal and equal Skin: warm and dry  Neuro:  CNs 2-12 intact, no focal abnormalities noted Psych:  Normal affect   EKG:  The EKG was personally reviewed and demonstrates: NSR Telemetry:  Telemetry  was personally reviewed and demonstrates:  NSR  Relevant CV Studies: As above  Laboratory Data:  High Sensitivity Troponin:   Recent Labs  Lab 09/21/20 2021  TROPONINIHS 120*      Chemistry Recent Labs  Lab 09/21/20 2021  NA 141  K 4.1  CL 105  CO2 17*  GLUCOSE 101*  BUN 114*  CREATININE 14.20*  CALCIUM 7.6*  GFRNONAA 3*  ANIONGAP 19*    Recent Labs  Lab 09/21/20 2021  PROT 6.8  ALBUMIN 3.0*  AST 32  ALT 27  ALKPHOS 114  BILITOT 0.8   Hematology Recent Labs  Lab 09/21/20 2021  WBC 8.4  RBC 2.56*  HGB 7.3*  HCT 23.4*  MCV 91.4  MCH 28.5  MCHC 31.2  RDW 16.6*  PLT 411*   BNPNo results for input(s): BNP, PROBNP in the last 168 hours.  DDimer No results for input(s): DDIMER in the last 168 hours.   Radiology/Studies:  DG Chest Port 1 View  Result Date: 09/21/2020 CLINICAL DATA:  Shortness of breath, fever EXAM: PORTABLE CHEST 1 VIEW COMPARISON:  Chest radiograph dated 11/30/2019 FINDINGS: The heart is enlarged. Mild-to-moderate bilateral lower lung predominant airspace opacities are noted. A small left pleural effusion may contribute. There is no right pleural effusion. There is no pneumothorax. Degenerative changes are seen in the spine and right shoulder. IMPRESSION: 1. Mild-to-moderate bilateral lower lung predominant airspace opacities likely represent pneumonia. A small left pleural effusion may contribute. 2. Cardiomegaly. Electronically Signed   By: Zerita Boers M.D.   On: 09/21/2020 20:53     Assessment and Plan:   1. Chest pain (pleuritic) suspect uremic pericarditis 2. AKI (Cr 14) and BUN 114 3. Acute hypoxic respiratory failure sec to PNA (CAP) & HFpEF 4. BNP elevated and h/o HFpEF- acute on chronic HFpEF exacerbation from renal failure/volume up 5. P.a fib on warfarin (INR 1.1) 6. H/o Type A dissection s/p endovascular repair 7. H/o CVA 8. HTN, Tobacco use dx/COPD 9. H/o breast cancer in remission 10. Chronic anemia  Plan: -- the patient is being admitted with medicine service and Cards consulted for chest pain. Her CP is classic pleuritic in nature- she may have uremic pericarditis. Would recommend initiation of HD  and Neprhology is already on board - Trop elevation sec to above (demand ischemia) in the setting of PNA, HFpEF exacerbation- she is volume up- until HD- would aggressively diurese her with IV lasix high dose or Bumex (as the patient still makes urine).  - obtain ECHO in  am - P. afib- subtherpeutic INR- agree with IV heparin anticoagulation bridge - continue home medications- aspirin 81mg , metoprolol xl 50mg  daily, rosuvastatin 10mg , and norvasc 10mg  daily - HTN- uncontrolled: continue above meds- use nitro gtt if BP still high and uptitrate BP meds - Further mx of PNA, respiratory failure per primary team    Risk Assessment/Risk Scores:        New York Heart Association (NYHA) Functional Class NYHA Class III  CHA2DS2-VASc Score =    This indicates a  % annual risk of stroke. The patient's score is based upon:           For questions or updates, please contact [place] Please consult [email or web address] for contact info under    Signed, Renae Fickle, MD  09/21/2020 10:55 PM

## 2020-09-21 NOTE — ED Notes (Signed)
Dr Ralene Bathe has taken care of the [facility]

## 2020-09-21 NOTE — ED Triage Notes (Signed)
Emergency Medicine Provider Triage Evaluation Note  Monique Henderson , a 70 y.o. female  was evaluated in triage.  Pt complains of chest pain. She presents the emergency department by EMS complaining of chest pain, fevers, chills, cough. Seen by PCP and had negative fluid and COVID test. Symptoms are worsening. EMS reports sats of 87% on room air..  Review of Systems  Positive: fevers, chest pain, shortness of breath   Physical Exam  BP (!) 193/109 (BP Location: Right Arm)   Pulse 90   Temp 99.7 F (37.6 C) (Oral)   Resp 20   SpO2 (!) 87%  Gen:   Awake, uncomfortable appearing  HEENT:  Atraumatic  Resp:  Tachypnea, rhonchi, frequent coughing Cardiac:  Normal rate  Abd:   Nondistended, nontender  MSK:   Moves extremities without difficulty  Neuro:  Speech clear   Medical Decision Making  Medically screening exam initiated at 7:58 PM.  Appropriate orders placed.  Monique Henderson was informed that the remainder of the evaluation will be completed by another provider, this initial triage assessment does not replace that evaluation, and the importance of remaining in the ED until their evaluation is complete.  Clinical Impression  Fever, cough, hypoxia, requires further work up and treatment.    Monique Reichert, MD 09/21/20 2000

## 2020-09-22 ENCOUNTER — Inpatient Hospital Stay (HOSPITAL_COMMUNITY): Payer: Medicare Other

## 2020-09-22 DIAGNOSIS — R079 Chest pain, unspecified: Secondary | ICD-10-CM

## 2020-09-22 DIAGNOSIS — I160 Hypertensive urgency: Secondary | ICD-10-CM | POA: Diagnosis not present

## 2020-09-22 DIAGNOSIS — D631 Anemia in chronic kidney disease: Secondary | ICD-10-CM

## 2020-09-22 DIAGNOSIS — I480 Paroxysmal atrial fibrillation: Secondary | ICD-10-CM | POA: Diagnosis not present

## 2020-09-22 DIAGNOSIS — N179 Acute kidney failure, unspecified: Secondary | ICD-10-CM | POA: Diagnosis not present

## 2020-09-22 DIAGNOSIS — I361 Nonrheumatic tricuspid (valve) insufficiency: Secondary | ICD-10-CM

## 2020-09-22 DIAGNOSIS — I340 Nonrheumatic mitral (valve) insufficiency: Secondary | ICD-10-CM

## 2020-09-22 DIAGNOSIS — Z8673 Personal history of transient ischemic attack (TIA), and cerebral infarction without residual deficits: Secondary | ICD-10-CM | POA: Diagnosis not present

## 2020-09-22 DIAGNOSIS — N189 Chronic kidney disease, unspecified: Secondary | ICD-10-CM

## 2020-09-22 HISTORY — PX: IR FLUORO GUIDE CV LINE RIGHT: IMG2283

## 2020-09-22 HISTORY — PX: IR VENOCAVAGRAM SVC: IMG679

## 2020-09-22 HISTORY — PX: IR US GUIDE VASC ACCESS RIGHT: IMG2390

## 2020-09-22 HISTORY — PX: IR US GUIDE VASC ACCESS LEFT: IMG2389

## 2020-09-22 LAB — MRSA PCR SCREENING: MRSA by PCR: NEGATIVE

## 2020-09-22 LAB — BASIC METABOLIC PANEL
Anion gap: 19 — ABNORMAL HIGH (ref 5–15)
BUN: 113 mg/dL — ABNORMAL HIGH (ref 8–23)
CO2: 16 mmol/L — ABNORMAL LOW (ref 22–32)
Calcium: 7.4 mg/dL — ABNORMAL LOW (ref 8.9–10.3)
Chloride: 107 mmol/L (ref 98–111)
Creatinine, Ser: 13.93 mg/dL — ABNORMAL HIGH (ref 0.44–1.00)
GFR, Estimated: 3 mL/min — ABNORMAL LOW (ref >60)
Glucose, Bld: 111 mg/dL — ABNORMAL HIGH (ref 70–99)
Potassium: 3.6 mmol/L (ref 3.5–5.1)
Sodium: 142 mmol/L (ref 135–145)

## 2020-09-22 LAB — HEPATITIS B CORE ANTIBODY, TOTAL: Hep B Core Total Ab: REACTIVE — AB

## 2020-09-22 LAB — RENAL FUNCTION PANEL
Albumin: 2.5 g/dL — ABNORMAL LOW (ref 3.5–5.0)
Anion gap: 16 — ABNORMAL HIGH (ref 5–15)
BUN: 113 mg/dL — ABNORMAL HIGH (ref 8–23)
CO2: 18 mmol/L — ABNORMAL LOW (ref 22–32)
Calcium: 7.2 mg/dL — ABNORMAL LOW (ref 8.9–10.3)
Chloride: 105 mmol/L (ref 98–111)
Creatinine, Ser: 13.98 mg/dL — ABNORMAL HIGH (ref 0.44–1.00)
GFR, Estimated: 3 mL/min — ABNORMAL LOW (ref >60)
Glucose, Bld: 121 mg/dL — ABNORMAL HIGH (ref 70–99)
Phosphorus: 8.2 mg/dL — ABNORMAL HIGH (ref 2.5–4.6)
Potassium: 3.5 mmol/L (ref 3.5–5.1)
Sodium: 139 mmol/L (ref 135–145)

## 2020-09-22 LAB — ECHOCARDIOGRAM COMPLETE
Calc EF: 50 %
Height: 63 in
Radius: 0.3 cm
S' Lateral: 2.8 cm
Single Plane A2C EF: 55.4 %
Single Plane A4C EF: 49.9 %
Weight: 1728.41 oz

## 2020-09-22 LAB — RETICULOCYTES
Immature Retic Fract: 12.6 % (ref 2.3–15.9)
RBC.: 2.28 MIL/uL — ABNORMAL LOW (ref 3.87–5.11)
Retic Count, Absolute: 27.4 10*3/uL (ref 19.0–186.0)
Retic Ct Pct: 1.2 % (ref 0.4–3.1)

## 2020-09-22 LAB — CBC
HCT: 21 % — ABNORMAL LOW (ref 36.0–46.0)
HCT: 17.9 % — ABNORMAL LOW (ref 36.0–46.0)
Hemoglobin: 6.6 g/dL — CL (ref 12.0–15.0)
Hemoglobin: 5.5 g/dL — CL (ref 12.0–15.0)
MCH: 28.3 pg (ref 26.0–34.0)
MCH: 27.9 pg (ref 26.0–34.0)
MCHC: 31.4 g/dL (ref 30.0–36.0)
MCHC: 30.7 g/dL (ref 30.0–36.0)
MCV: 90.1 fL (ref 80.0–100.0)
MCV: 90.9 fL (ref 80.0–100.0)
Platelets: 372 10*3/uL (ref 150–400)
Platelets: 330 10*3/uL (ref 150–400)
RBC: 2.33 MIL/uL — ABNORMAL LOW (ref 3.87–5.11)
RBC: 1.97 MIL/uL — ABNORMAL LOW (ref 3.87–5.11)
RDW: 16.5 % — ABNORMAL HIGH (ref 11.5–15.5)
RDW: 16.4 % — ABNORMAL HIGH (ref 11.5–15.5)
WBC: 7 10*3/uL (ref 4.0–10.5)
WBC: 6.4 10*3/uL (ref 4.0–10.5)
nRBC: 0 % (ref 0.0–0.2)
nRBC: 0 % (ref 0.0–0.2)

## 2020-09-22 LAB — IRON AND TIBC
Iron: 11 ug/dL — ABNORMAL LOW (ref 28–170)
Saturation Ratios: 5 % — ABNORMAL LOW (ref 10.4–31.8)
TIBC: 234 ug/dL — ABNORMAL LOW (ref 250–450)
UIBC: 223 ug/dL

## 2020-09-22 LAB — HEPATITIS B SURFACE ANTIBODY,QUALITATIVE: Hep B S Ab: REACTIVE — AB

## 2020-09-22 LAB — BRAIN NATRIURETIC PEPTIDE: B Natriuretic Peptide: 3659.7 pg/mL — ABNORMAL HIGH (ref 0.0–100.0)

## 2020-09-22 LAB — HEMOGLOBIN AND HEMATOCRIT, BLOOD
HCT: 25.3 % — ABNORMAL LOW (ref 36.0–46.0)
Hemoglobin: 8.5 g/dL — ABNORMAL LOW (ref 12.0–15.0)

## 2020-09-22 LAB — PROCALCITONIN
Procalcitonin: 5.12 ng/mL
Procalcitonin: 5.24 ng/mL

## 2020-09-22 LAB — HEPARIN LEVEL (UNFRACTIONATED)
Heparin Unfractionated: 0.13 IU/mL — ABNORMAL LOW (ref 0.30–0.70)
Heparin Unfractionated: 0.19 IU/mL — ABNORMAL LOW (ref 0.30–0.70)

## 2020-09-22 LAB — TROPONIN I (HIGH SENSITIVITY): Troponin I (High Sensitivity): 141 ng/L (ref <18)

## 2020-09-22 LAB — PREPARE RBC (CROSSMATCH)

## 2020-09-22 LAB — HEPATITIS B SURFACE ANTIGEN: Hepatitis B Surface Ag: NONREACTIVE

## 2020-09-22 LAB — FOLATE: Folate: 16.2 ng/mL (ref >5.9)

## 2020-09-22 LAB — VITAMIN B12: Vitamin B-12: 448 pg/mL (ref 180–914)

## 2020-09-22 LAB — FERRITIN: Ferritin: 198 ng/mL (ref 11–307)

## 2020-09-22 LAB — HIV ANTIBODY (ROUTINE TESTING W REFLEX): HIV Screen 4th Generation wRfx: NONREACTIVE

## 2020-09-22 MED ORDER — METOPROLOL TARTRATE 5 MG/5ML IV SOLN
2.5000 mg | Freq: Once | INTRAVENOUS | Status: AC
  Administered 2020-09-22: 2.5 mg via INTRAVENOUS
  Filled 2020-09-22: qty 5

## 2020-09-22 MED ORDER — IOHEXOL 300 MG/ML  SOLN
50.0000 mL | Freq: Once | INTRAMUSCULAR | Status: AC | PRN
  Administered 2020-09-22: 15 mL via INTRAVENOUS

## 2020-09-22 MED ORDER — FUROSEMIDE NICU IV SYRINGE 10 MG/ML: 80.0000 mg | Freq: Two times a day (BID) | INTRAMUSCULAR | Status: DC

## 2020-09-22 MED ORDER — FUROSEMIDE 10 MG/ML IJ SOLN
80.0000 mg | Freq: Two times a day (BID) | INTRAMUSCULAR | Status: DC
  Administered 2020-09-22 – 2020-09-24 (×7): 80 mg via INTRAVENOUS
  Filled 2020-09-22 (×7): qty 8

## 2020-09-22 MED ORDER — HEPARIN SODIUM (PORCINE) 1000 UNIT/ML IJ SOLN
INTRAMUSCULAR | Status: AC
  Filled 2020-09-22: qty 1

## 2020-09-22 MED ORDER — NITROGLYCERIN 0.2 MG/HR TD PT24
0.2000 mg | MEDICATED_PATCH | Freq: Every day | TRANSDERMAL | Status: DC
  Administered 2020-09-22 – 2020-09-27 (×6): 0.2 mg via TRANSDERMAL
  Filled 2020-09-22 (×6): qty 1

## 2020-09-22 MED ORDER — HEPARIN SODIUM (PORCINE) 1000 UNIT/ML IJ SOLN
INTRAMUSCULAR | Status: AC | PRN
  Administered 2020-09-22: 2.8 mL via INTRAVENOUS

## 2020-09-22 MED ORDER — METOPROLOL SUCCINATE ER 50 MG PO TB24
75.0000 mg | ORAL_TABLET | Freq: Every day | ORAL | Status: DC
  Administered 2020-09-23 – 2020-09-27 (×5): 75 mg via ORAL
  Filled 2020-09-22 (×5): qty 1

## 2020-09-22 MED ORDER — HEPARIN SODIUM (PORCINE) 1000 UNIT/ML DIALYSIS: 2000.0000 [IU] | INTRAMUSCULAR | Status: DC | PRN

## 2020-09-22 MED ORDER — DOXYCYCLINE HYCLATE 100 MG PO TABS
100.0000 mg | ORAL_TABLET | Freq: Two times a day (BID) | ORAL | Status: AC
  Administered 2020-09-22 – 2020-09-25 (×8): 100 mg via ORAL
  Filled 2020-09-22 (×8): qty 1

## 2020-09-22 MED ORDER — LIDOCAINE HCL 1 % IJ SOLN
INTRAMUSCULAR | Status: AC | PRN
  Administered 2020-09-22: 20 mL

## 2020-09-22 MED ORDER — HEPARIN SODIUM (PORCINE) 1000 UNIT/ML IJ SOLN
INTRAMUSCULAR | Status: AC
  Administered 2020-09-22: 2000 [IU] via INTRAVENOUS_CENTRAL
  Filled 2020-09-22: qty 3

## 2020-09-22 MED ORDER — SODIUM CHLORIDE 0.9% IV SOLUTION: Freq: Once | INTRAVENOUS | Status: DC

## 2020-09-22 MED ORDER — LIDOCAINE HCL 1 % IJ SOLN
INTRAMUSCULAR | Status: AC
  Filled 2020-09-22: qty 20

## 2020-09-22 NOTE — Progress Notes (Signed)
HOSPITAL MEDICINE OVERNIGHT EVENT NOTE    Notified by nursing that patient's hemoglobin has dropped to 6.6 today.  This is in the setting of volume overload, advanced renal failure and ongoing attempts at diuresis that have failed up to this point.  I believe that patient's volume overload and respiratory status will worsen with a blood transfusion due to advanced renal disease.  Patient was to undergo dialysis yesterday evening but due to access issues this was not performed.  Will place order for 1 unit of packed red blood cells to be typed and crossmatched with the intent for blood to be transfused with hemodialysis.  [name]  MD [facility]

## 2020-09-22 NOTE — Progress Notes (Signed)
  Echocardiogram 2D Echocardiogram has been performed.  Monique Henderson 09/22/2020, 10:11 AM

## 2020-09-22 NOTE — Progress Notes (Signed)
Progress Note  Patient Name: Monique Henderson Date of Encounter: 09/22/2020  [name] Cardiologist: No primary care provider on file.   Subjective   Admitted yesterday with symptomatic anemia, acute renal failure with volume overload. Overnight HD was attempted but not successful because of AVF infiltration. Planning for percutaneous HD cath placement today with HD and PRBC transfusion.  Inpatient Medications    Scheduled Meds: . sodium chloride   Intravenous Once  . amLODipine  10 mg Oral Daily  . Chlorhexidine Gluconate Cloth  6 each Topical Q0600  . furosemide  80 mg Intravenous Q12H  . hydrALAZINE  25 mg Oral BID  . metoprolol succinate  50 mg Oral Daily  . multivitamin  1 tablet Oral QHS  . rosuvastatin  10 mg Oral Daily   Continuous Infusions: . sodium chloride    . azithromycin    . cefTRIAXone (ROCEPHIN)  IV    . heparin 800 Units/hr (09/22/20 0800)   PRN Meds: sodium chloride, albuterol, fentaNYL (SUBLIMAZE) injection, hydrALAZINE, ondansetron **OR** ondansetron (ZOFRAN) IV, senna-docusate   Vital Signs    Vitals:   09/22/20 0200 09/22/20 0400 09/22/20 0500 09/22/20 0800  BP: (!) 183/97 (!) 152/90 125/90 (!) 174/85  Pulse: 89 90 92 98  Resp: 20 20 20 19   Temp:   98.4 F (36.9 C)   TempSrc:   Oral   SpO2: 99% 100% 98% 99%  Weight:      Height:        Intake/Output Summary (Last 24 hours) at 09/22/2020 1014 Last data filed at 09/22/2020 0800 Gross per 24 hour  Intake 406.78 ml  Output 700 ml  Net -293.22 ml   Last 3 Weights 09/22/2020 09/21/2020 05/13/2020  Weight (lbs) 108 lb 0.4 oz 102 lb 15.3 oz 103 lb  Weight (kg) 49 kg 46.7 kg 46.72 kg      Telemetry    Sinus with PVCs - Personally Reviewed  ECG    Personally Reviewed  Physical Exam   GEN: uncomfortable appearing, coughing   Neck: No JVD Cardiac: RRR, no murmurs, rubs, or gallops. LUE warm. Bruit present. Tender to touch throughout entire upper arm. Respiratory: poor aeration  to bilateral bases. Crackles and Wheezing bilaterally. Scattered rhonci. GI: Soft, non-distended  MS: No deformity. Neuro:  Nonfocal  Psych: Normal affect   Labs    High Sensitivity Troponin:   Recent Labs  Lab 09/21/20 2021 09/21/20 2300  TROPONINIHS 120* 141*      Chemistry Recent Labs  Lab 09/21/20 2021 09/22/20 0447  NA 141 142  K 4.1 3.6  CL 105 107  CO2 17* 16*  GLUCOSE 101* 111*  BUN 114* 113*  CREATININE 14.20* 13.93*  CALCIUM 7.6* 7.4*  PROT 6.8  --   ALBUMIN 3.0*  --   AST 32  --   ALT 27  --   ALKPHOS 114  --   BILITOT 0.8  --   GFRNONAA 3* 3*  ANIONGAP 19* 19*     Hematology Recent Labs  Lab 09/21/20 2021 09/22/20 0447  WBC 8.4 7.0  RBC 2.56* 2.33*  2.28*  HGB 7.3* 6.6*  HCT 23.4* 21.0*  MCV 91.4 90.1  MCH 28.5 28.3  MCHC 31.2 31.4  RDW 16.6* 16.5*  PLT 411* 372    BNP Recent Labs  Lab 09/21/20 2300  BNP 3,659.7*     DDimer No results for input(s): DDIMER in the last 168 hours.   Radiology    DG Chest [place] 1  View  Result Date: 09/21/2020 CLINICAL DATA:  Shortness of breath, fever EXAM: PORTABLE CHEST 1 VIEW COMPARISON:  Chest radiograph dated 11/30/2019 FINDINGS: The heart is enlarged. Mild-to-moderate bilateral lower lung predominant airspace opacities are noted. A small left pleural effusion may contribute. There is no right pleural effusion. There is no pneumothorax. Degenerative changes are seen in the spine and right shoulder. IMPRESSION: 1. Mild-to-moderate bilateral lower lung predominant airspace opacities likely represent pneumonia. A small left pleural effusion may contribute. 2. Cardiomegaly. Electronically Signed   By: Zerita Boers M.D.   On: 09/21/2020 20:53    Cardiac Studies   09/22/2020 Echo personallly reviewed / prelim read EF 50% LV concentric severe hypertrophy with heavy trabeculations Prominent pericardial fat pad Trivial PEff Mild TR RV mild dysfunction      Assessment & Plan    70yo woman with  CKD/ESRD now, severe HTN, admitted with CP found to have severe anemia. Her kidney disease has progressed and she is now ESRD. Primary is awaiting a temp HD catheter to dialyze the patient and provide PRBC transfusion.  1. Acute hypoxic respiratory failure 2/2 pulmonary edema 2/2 ESRD and HTN 2. Chronic diastolic heart failure 3. pAF on coumadin 4. H/o aortic dissection 5. HTN 6. Copd 7. Anemia of CKD  Needs dialysis and PRBC transfusion. Nephro following.   For questions or updates, please contact [place] Please consult [email or web address] for contact info under        Signed, Vickie Epley, MD  09/22/2020, 10:14 AM

## 2020-09-22 NOTE — Progress Notes (Signed)
Ernest Haber, PA saw pt. At bedside. Orders to stop HD at this time. Pt. Stable.

## 2020-09-22 NOTE — Progress Notes (Signed)
Date and time results received: 09/22/20 05:32 AM (use smartphrase ".now" to insert current time)  Test: Hemoglobin Critical Value: 6.6  Name of Provider Notified: Dr Cyd Silence  Orders Received? Or Actions Taken?: Orders received

## 2020-09-22 NOTE — Procedures (Signed)
Tolerating HD via temp HD cath. BP's stable.   I was present at this dialysis session, have reviewed the session itself and made  appropriate changes Kelly Splinter MD [place] pager [phone]   09/22/2020, 2:54 PM

## 2020-09-22 NOTE — Progress Notes (Addendum)
PROGRESS NOTE    Monique Henderson  BHA:[id number]  DOB: Apr 04, 1951  PCP: Maximiano Coss, NP Admit date:09/21/2020 Chief compliant: chest pain, cough, sob  70 y.o. 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  She denies any leg swellings ED Course: Afebrile, BP 190/110,hypoxic 87% on room air, and with blood pressure as high as 193/109.  EKG features sinus rhythm with sinus arrhythmia.  Chest x-ray with cardiomegaly and bilateral lower lung airspace opacities. BUN 114, and creatinine 14.2, bicarb 17.  CBC notable for Hgb of 7.3.  Lactic acid is normal, INR 1.1, and Troponin 120.   Hospital course: Patient admitted to [facility] with 02-2Lts, CAP rx , Cardiology and nephrology consultations. Plan for HD but makes urine, high dose IV lasix meanwhile per Cards. [place]ad ischemia, repeat echo. INR subtherapeutic, preserved EF on echo 07/2019. IV heparin as CHADSVASC 6, anemia w/u sent--overnight Hgb dropped to 6.6--PRBC ordered to be given with HD.    Subjective: Patient seen and examined this morning.  HD could not be completed overnight as AV fistula infiltrated.  Patient complaining of swelling and pain at AVF site.  Cough improving-reports white to yellow phlegm.  Afebrile here.  Still on 2 L O2 and saturating well.  No complaints of chest pain.  Husband at bedside.   Objective: Vitals:   09/22/20 0154 09/22/20 0200 09/22/20 0400 09/22/20 0500  BP: (!) 187/116 (!) 183/97 (!) 152/90 125/90  Pulse: 99 89 90 92  Resp: 20 20 20 20   Temp: 98.4 F (36.9 C)   98.4 F (36.9 C)  TempSrc: Oral   Oral  SpO2: 97% 99% 100% 98%  Weight: 49 kg     Height: 5\' 3"  (1.6 m)        Intake/Output Summary (Last 24 hours) at 09/22/2020 0727 Last data filed at 09/22/2020 0659 Gross per 24 hour  Intake 143.13 ml  Output 700 ml  Net -556.87 ml   Filed Weights   09/21/20 2001 09/22/20 0154  Weight: 46.7 kg 49 kg    Physical Examination:  General: Thin built, mild respiratory distress while laying flat or talking full sentences, saturating well on 2 L O2 Head ENT: Atraumatic normocephalic, PERRLA, neck supple Heart: S1-S2 heard, mildly tachycardic.  No significant leg edema noted Lungs: Equal air entry bilaterally, no rhonchi or rales on exam, no accessory muscle use Abdomen: Bowel sounds heard, soft, nontender, nondistended. No organomegaly.  No CVA tenderness Extremities: Subcutaneous edema, tenderness along left forearm/AVF site Neurological: Awake alert oriented x3, no focal weakness or numbness, strength and sensations to crude touch intact Skin: No wounds or rashes.    Data Reviewed: I have personally reviewed following labs and imaging studies  CBC: Recent Labs  Lab 09/21/20 2021 09/22/20 0447  WBC 8.4 7.0  NEUTROABS 7.0  --   HGB 7.3* 6.6*  HCT 23.4* 21.0*  MCV 91.4 90.1  PLT 411* 973   Basic Metabolic Panel: Recent Labs  Lab 09/21/20 2021 09/22/20 0447  NA 141 142  K 4.1 3.6  CL 105 107  CO2 17* 16*  GLUCOSE 101* 111*  BUN 114* 113*  CREATININE 14.20* 13.93*  CALCIUM 7.6* 7.4*   GFR: Estimated  Creatinine Clearance: 3 mL/min (A) (by C-G formula based on SCr of 13.93 mg/dL (H)). Liver Function Tests: Recent Labs  Lab 09/21/20 2021  AST 32  ALT 27  ALKPHOS 114  BILITOT 0.8  PROT 6.8  ALBUMIN 3.0*   No results for input(s): LIPASE, AMYLASE in the last 168 hours. No results for input(s): AMMONIA in the last 168 hours. Coagulation Profile: Recent Labs  Lab 09/21/20 2021  INR 1.1   Cardiac Enzymes: No results for input(s): CKTOTAL, CKMB, CKMBINDEX, TROPONINI in the last 168 hours. BNP (last 3 results) No results for  input(s): PROBNP in the last 8760 hours. HbA1C: No results for input(s): HGBA1C in the last 72 hours. CBG: No results for input(s): GLUCAP in the last 168 hours. Lipid Profile: No results for input(s): CHOL, HDL, LDLCALC, TRIG, CHOLHDL, LDLDIRECT in the last 72 hours. Thyroid Function Tests: No results for input(s): TSH, T4TOTAL, FREET4, T3FREE, THYROIDAB in the last 72 hours. Anemia Panel: Recent Labs    09/22/20 0447  VITAMINB12 448  FOLATE 16.2  FERRITIN 198  TIBC 234*  IRON 11*  RETICCTPCT 1.2   Sepsis Labs: Recent Labs  Lab 09/21/20 2021 09/21/20 2300 09/22/20 0447  PROCALCITON  --  5.12 5.24  LATICACIDVEN 1.0  --   --     Recent Results (from the past 240 hour(s))  Resp Panel by RT-PCR (Flu A&B, Covid) Nasopharyngeal Swab     Status: None   Collection Time: 09/21/20  8:54 PM   Specimen: Nasopharyngeal Swab; Nasopharyngeal(NP) swabs in vial transport medium  Result Value Ref Range Status   SARS Coronavirus 2 by RT PCR NEGATIVE NEGATIVE Final    Comment: (NOTE) SARS-CoV-2 target nucleic acids are NOT DETECTED.  The SARS-CoV-2 RNA is generally detectable in upper respiratory specimens during the acute phase of infection. The lowest concentration of SARS-CoV-2 viral copies this assay can detect is 138 copies/mL. A negative result does not preclude SARS-Cov-2 infection and should not be used as the sole basis for treatment or other patient management decisions. A negative result may occur with  improper specimen collection/handling, submission of specimen other than nasopharyngeal swab, presence of viral mutation(s) within the areas targeted by this assay, and inadequate number of viral copies(<138 copies/mL). A negative result must be combined with clinical observations, patient history, and epidemiological information. The expected result is Negative.  Fact Sheet for Patients:  EntrepreneurPulse.com.au  Fact Sheet for Healthcare Providers:   IncredibleEmployment.be  This test is no t yet approved or cleared by the [place] FDA and  has been authorized for detection and/or diagnosis of SARS-CoV-2 by FDA under an Emergency Use Authorization (EUA). This EUA will remain  in effect (meaning this test can be used) for the duration of the COVID-19 declaration under Section 564(b)(1) of the Act, 21 U.S.C.section 360bbb-3(b)(1), unless the authorization is terminated  or revoked sooner.       Influenza A by PCR NEGATIVE NEGATIVE Final   Influenza B by PCR NEGATIVE NEGATIVE Final    Comment: (NOTE) The Xpert Xpress SARS-CoV-2/FLU/RSV plus assay is intended as an aid in the diagnosis of influenza from Nasopharyngeal swab specimens and should not be used as a sole basis for treatment. Nasal washings and aspirates are unacceptable for Xpert Xpress SARS-CoV-2/FLU/RSV testing.  Fact Sheet for Patients: EntrepreneurPulse.com.au  Fact Sheet for Healthcare Providers: IncredibleEmployment.be  This test is not yet approved or cleared by the [place] FDA and has been authorized for detection and/or diagnosis of SARS-CoV-2 by FDA  under an Emergency Use Authorization (EUA). This EUA will remain in effect (meaning this test can be used) for the duration of the COVID-19 declaration under Section 564(b)(1) of the Act, 21 U.S.C. section 360bbb-3(b)(1), unless the authorization is terminated or revoked.  Performed at [facility], [place] [address], [place], [place] [address]   MRSA PCR Screening     Status: None   Collection Time: 09/22/20  2:02 AM   Specimen: Nasal Mucosa; Nasopharyngeal  Result Value Ref Range Status   MRSA by PCR NEGATIVE NEGATIVE Final    Comment:        The GeneXpert MRSA Assay (FDA approved for NASAL specimens only), is one component of a comprehensive MRSA colonization surveillance program. It is not intended to diagnose MRSA infection nor  to guide or monitor treatment for MRSA infections. Performed at [facility], [place] [address], [place], [place] [address]       Radiology Studies: DG Chest Port 1 View  Result Date: 09/21/2020 CLINICAL DATA:  Shortness of breath, fever EXAM: PORTABLE CHEST 1 VIEW COMPARISON:  Chest radiograph dated 11/30/2019 FINDINGS: The heart is enlarged. Mild-to-moderate bilateral lower lung predominant airspace opacities are noted. A small left pleural effusion may contribute. There is no right pleural effusion. There is no pneumothorax. Degenerative changes are seen in the spine and right shoulder. IMPRESSION: 1. Mild-to-moderate bilateral lower lung predominant airspace opacities likely represent pneumonia. A small left pleural effusion may contribute. 2. Cardiomegaly. Electronically Signed   By: Zerita Boers M.D.   On: 09/21/2020 20:53      Scheduled Meds: . sodium chloride   Intravenous Once  . amLODipine  10 mg Oral Daily  . Chlorhexidine Gluconate Cloth  6 each Topical Q0600  . furosemide  80 mg Intravenous Q12H  . hydrALAZINE  25 mg Oral BID  . metoprolol succinate  50 mg Oral Daily  . multivitamin  1 tablet Oral QHS  . rosuvastatin  10 mg Oral Daily   Continuous Infusions: . sodium chloride    . azithromycin    . cefTRIAXone (ROCEPHIN)  IV    . heparin 800 Units/hr (09/22/20 0500)     Assessment/Plan:  Acute hypoxic respiratory failure: Secondary to volume overload in the setting of #2 vs CHF vs PNA.  Favor volume overload as afebrile here although reports subjective fevers at home, productive cough but white to yellow phlegm.  CXR bibasilar infiltrates vs edema. Check BNP. Nl WBC. Procalcitonin 5.2 and on Abx. Urine Ag pending--can likely change IV antibiotics to p.o. in order to avoid fluid overload  AKI on CKD stage 5:  Had AKI on CKD and had to go on HD after her TAA repair surgery in Nov 2020-L arm access/ AVF placed at that time but never had to be used. Thereafter she  was able to come off of dialysis.  Attempted HD overnight but access infiltrated-plan to undergo HD catheter placement possibly today per RN/nephrology  Normocytic acute on chronic anemia: Baseline hemoglobin around 8.  Likely AOCD with dilutional component . Stool OCB pending. PRBC with HD ordered, repeat H&H post transfusion.  Iron studies suggest iron deficiency with Fe low at 11, percent sat low at 5.  TIBC 234, ferritin 198.  Normal folate and B12.  Defer IV iron to nephrology  Paroxysmal atrial fibrillation: Currently in sinus rhythm.CHADS-VASc at least 6 (CVA x2, age, gender, CHF, HTN) .  Patient claims compliance with warfarin but INR subtherapeutic at 1.1.  Cardiology agrees with  IV heparin bridging and resuming warfarin once no procedures anticipated (HD catheter placement today)  Chest pain, elevated troponin: Seen by cardiology and felt to have pleuritic chest pain-possibly pericarditis-troponin elevation due to demand ischemia in the setting of uncontrolled blood pressure, advanced CKD with volume overload. Avoid NSAIDs with renal issues.  Can consider colchicine/steroids if needed  Hypertensive urgency: Resumed Norvasc, metoprolol, hydralazine 25 additional as needed medications available.  Should improve with HD-continue to monitor.  History of CVA: Continue statins and anticoagulation  DVT prophylaxis: Heparin Code Status: Full code Family / Patient Communication: Discussed with patient and husband in detail at bedside Disposition Plan:   Status is: Inpatient  Remains inpatient appropriate because:Inpatient level of care appropriate due to severity of illness   Dispo: The patient is from: Home              Anticipated d/c is to: Home              Patient currently is not medically stable to d/c.   Difficult to place patient No     Update 3 PM: Notified by RN that patient had 6-8 beat V. tach while in dialysis-apparently not unusual for her.  Likely also exacerbated by  severe anemia.  HD aborted after 1.5 L fluid removal.  Received 1 unit PRBC in HD and receiving second unit in her room now.  Blood pressure fluctuating, currently elevated at systolic 267T.  Will give 1 dose of metoprolol IV and increase Toprol XL dose to 75 mg (given from a.m.)     Time spent: 35 minutes     >50% time spent in discussions with care team and coordination of care.    Guilford Shi, MD [facility] Pager in [place]  If 7PM-7AM, please contact night-coverage [email or web address] 09/22/2020, 7:27 AM

## 2020-09-22 NOTE — Progress Notes (Signed)
[facility] for Heparin Indication: atrial fibrillation  Allergies  Allergen Reactions  . Shrimp [Shellfish Allergy] Shortness Of Breath  . Bactroban [Mupirocin] Other (See Comments)    "Sores in nose"  . Vicodin [Hydrocodone-Acetaminophen] Itching and Nausea And Vomiting    This is patient's home medication  . Eliquis [Apixaban] Rash  . Lisinopril Cough  . Tylenol [Acetaminophen] Itching    Patient Measurements: Height: 5\' 3"  (160 cm) Weight: 50.9 kg (112 lb 3.4 oz) IBW/kg (Calculated) : 52.4  Vital Signs: Temp: 99.2 F (37.3 C) (04/24 1900) Temp Source: Oral (04/24 1900) BP: 179/101 (04/24 1900) Pulse Rate: 90 (04/24 1900)  Labs: Recent Labs    09/21/20 2021 09/21/20 2300 09/22/20 0447 09/22/20 0618 09/22/20 1250 09/22/20 1303 09/22/20 2023  HGB 7.3*  --  6.6*  --  5.5*  --  8.5*  HCT 23.4*  --  21.0*  --  17.9*  --  25.3*  PLT 411*  --  372  --  330  --   --   APTT 40*  --   --   --   --   --   --   LABPROT 14.4  --   --   --   --   --   --   INR 1.1  --   --   --   --   --   --   HEPARINUNFRC  --   --   --  0.13*  --   --  0.19*  CREATININE 14.20*  --  13.93*  --   --  13.98*  --   TROPONINIHS 120* 141*  --   --   --   --   --     Estimated Creatinine Clearance: 3.1 mL/min (A) (by C-G formula based on SCr of 13.98 mg/dL (H)).   Medical History: Past Medical History:  Diagnosis Date  . AF (paroxysmal atrial fibrillation) ([employer]) 05/29/2019  . Aortic atherosclerosis ([place]) 07/05/2019  . Aortic dissection ([place]) 04/04/2019  . Atelectasis 2002   Bilateral  . Atrial fibrillation with RVR ([place]) 05/18/2019  . Atypical chest pain 07/05/2019  . Bone spur 2008   Right calcaneal foot spur  . Breast cancer ([place]) 2004   Ductal carcinoma in situ of the left breast; S/P left partial mastectomy 02/26/2003; S/P re-excision of cranial and lateral margins11/18/2004.radiation  . Breast cancer ([place]) 09/21/2012   right breast/ last radiation  treatment 03/22/2013  . Cerebral thrombosis with cerebral infarction 05/22/2019  . Chronic kidney disease, stage IV (severe) ([place]) 10/10/2007  . Chronic low back pain 06/22/2016  . Chronic obstructive lung disease ([place]) 01/16/2017  . CKD (chronic kidney disease), stage IV ([place]) 07/19/2017  . 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 Autumn Messing; S/P re-excision of superior and inferior margins 10/27/2012.   . Dissection of aorta ([place]) 04/03/2019  . Elevated troponin 08/28/2019  . ESRD on dialysis ([place]) 05/29/2019  . Essential hypertension 09/16/2006  . GERD 09/16/2006  . GERD (gastroesophageal reflux disease)   . H/O: stroke 05/29/2019  . 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 Hot flashes   . Hx of radiation therapy 2005   left breast  . Hx of radiation therapy 01/11/13- 03/22/13   right breast 5760 cGy 30 sessions  . Hyperkalemia 08/28/2019  . Hypertension   . Insomnia 03/14/2015  . Low back pain   .  Lumbar spinal stenosis    S/P lumbar decompressive laminectomy, fusion and plating for lumbar spinal stensosis  . Malnutrition of moderate degree 05/19/2019  . Non compliance with medical treatment 12/04/2017  . Normocytic anemia    With thrombocytosis  . On Coumadin for atrial fibrillation ([place]) 10/03/2019  . Osteoarthritis   . Paroxysmal SVT (supraventricular tachycardia) ([place]) 08/27/2019  . Personal history of radiation therapy   . Pleural effusion 05/18/2019  . Positive D dimer 08/28/2019  . Right ureteral stone 2002  . S/P aortic aneurysm repair 04/07/2019  . S/P lumbar spinal fusion 01/18/2014   S/P lumbar decompressive laminectomy, fusion, and plating for lumbar spinal stenosis on 05/27/2009 by Dr. Eustace Moore.  S/P anterolateral retroperitoneal interbody fusion L2-3 utilizing a 8 mm peek interbody cage packed with morcellized allograft, and anterior lumbar plating L2-3 for recurrent disc herniation L2-3  with spinal stenosis on 01/18/2014 by Dr. Eustace Moore.    . Shortness of breath    from pain  . SIRS (systemic inflammatory response syndrome) ([place]) 08/27/2019  . SVT (supraventricular tachycardia) ([place]) 08/28/2019  . Tobacco use disorder 04/19/2009  . Uterine fibroid   . Wears dentures    top    Medications:  No current facility-administered medications on file prior to encounter.   Current Outpatient Medications on File Prior to Encounter  Medication Sig Dispense Refill  . albuterol (VENTOLIN HFA) 108 (90 Base) MCG/ACT inhaler Inhale 2 puffs into the lungs every 6 (six) hours as needed for wheezing or shortness of breath. 8.5 g 0  . amLODipine (NORVASC) 10 MG tablet TAKE 1 TABLET (10 MG TOTAL) BY MOUTH DAILY. 90 tablet 0  . cloNIDine (CATAPRES - DOSED IN MG/24 HR) 0.3 mg/24hr patch Place 1 patch (0.3 mg total) onto the skin once a week. 4 patch 2  . furosemide (LASIX) 20 MG tablet Take 1 tablet (20 mg total) by mouth daily. 60 tablet 5  . hydrALAZINE (APRESOLINE) 25 MG tablet Take 1 tablet (25 mg total) by mouth 2 (two) times daily.    Marland Kitchen losartan (COZAAR) 100 MG tablet Take 100 mg by mouth at bedtime.    . metoprolol succinate (TOPROL-XL) 50 MG 24 hr tablet Take 1 tablet (50 mg total) by mouth daily. Take with or immediately following a meal. 90 tablet 1  . mirtazapine (REMERON) 7.5 MG tablet Take 1 tablet (7.5 mg total) by mouth at bedtime. 90 tablet 3  . ondansetron (ZOFRAN) 4 MG tablet Take 1 tablet (4 mg total) by mouth every 8 (eight) hours as needed for nausea or vomiting. 20 tablet 3  . oxyCODONE-acetaminophen (PERCOCET/ROXICET) 5-325 MG tablet Take 1 tablet by mouth 3 (three) times daily as needed for severe pain (pain.).    Marland Kitchen rosuvastatin (CRESTOR) 10 MG tablet TAKE 1 TABLET (10 MG TOTAL) BY MOUTH DAILY. 30 tablet 0  . warfarin (COUMADIN) 6 MG tablet TAKE 1 TABLET (6 MG TOTAL) BY MOUTH 4 (FOUR) TIMES A WEEK. 30 tablet 0  . warfarin (COUMADIN) 7.5 MG tablet Take 1 tablet (7.5 mg  total) by mouth 3 (three) times a week. 90 tablet 0     Assessment: 70 y.o. female with h/o Afib on Coumadin, INR subtherapeutic, for heparin.  Noted pt required higher than usual heparin rates during previous admission 07/2019.  Heparin level came back subtherapeutic at 0.19 on 800 units/hr. Hgb was 5.5 this afternoon - now received 2 PRBCs, Hgb up to 8.5. No s/sx of bleeding noticed by RN - no  infusion issues.   Goal of Therapy:  Heparin level 0.3-0.7 units/mL Monitor platelets by anticoagulation protocol: Yes   Plan:  Increase heparin infusion to 900 units/hr Check heparin level in 8 hours Monitor daily HL, CBC, and for s/sx of bleeding   Antonietta Jewel, PharmD, [facility] Pharmacist  Phone: [phone] 09/22/2020 9:16 PM  Please check AMION for all [facility] phone numbers After 10:00 PM, call [place] [phone]

## 2020-09-22 NOTE — Progress Notes (Signed)
This RN was on break, upon return of break RRT was at bedside with another RN and HD RN. Patient had a 6 beat run of v tach. EKG was performed and HD was stopped per nephrology. Patient became nauseas and had a very small amount of emesis. BP is noted to be in the 200s. PRN hydralazine was given. Dr. Earnest Conroy was notified of the event and elevated BP. Patient stable at this time. Will continue to monitor.

## 2020-09-22 NOTE — Procedures (Signed)
  Procedure: L jugular venogram, R EJ HD CVC placement chronic L innom vein occlusion EBL:   minimal Complications:  none immediate  See full dictation in [facility].  Dillard Cannon MD Main # [phone] Pager  [phone] Mobile [phone]

## 2020-09-22 NOTE — Progress Notes (Addendum)
[facility] Progress Note  Subjective: AVF not functioning (infiltrated immediately w/ attempts to stick), did not get HD. Pt seen this am.   Vitals:   09/22/20 0200 09/22/20 0400 09/22/20 0500 09/22/20 0800  BP: (!) 183/97 (!) 152/90 125/90 (!) 174/85  Pulse: 89 90 92 98  Resp: 20 20 20 19   Temp:   98.4 F (36.9 C)   TempSrc:   Oral   SpO2: 99% 100% 98% 99%  Weight:      Height:        Exam:   alert, nad , anxious  +jvd  Chest bilat basilar rales R > L   Cor reg no RG  Abd soft ntnd no ascites   Ext trace LE edema   Alert, NF, ox3    LUA AVF small , +bruit   Home meds:  - norvasc 10 / losartan 100 hs/ clonidine patch 0.3 weekly/ lasix 20 qd/ hydralazine 25 bid/ metoprolol xl 50 qd  - warfarin as directed  - crestor 10 qd  - remeron 7.5 hs/ percocet qid prn  - prn's/ vitamins/ supplements    CXR 4/23 - IMPRESSION: Mild-to-moderate bilateral lower lung predominant airspace opacities likely represent pneumonia. A small left pleural effusion may contribute. Cardiomegaly.    BP 190 /101  HR 80-90  RR 20- 26  Temp 99.7  87% on RA > 95 % on 2L [place]     Assessment/ Plan: 1. SOB/ hypoxic acute resp failure - due to pulm edema and/or poss pna. BP's high, +orthopnea. Needs dialysis, AVF immature/ infiltrated last night on attempt to dialyze. Plan IR consulted for temp HD cath and dialysis today thereafter w/ prbc transfusion, lower vol and solute. Have explained to pt and questions answered.  2. AKI on CKD V - b/l creat 5.5 from nov-dec 2021. Required HD after TAA surgery for a few months in late 2020, then recovered. Creat here is 13- 14. Not responding to IV lasix. Having sig resp issues. Suspect this is progression to ESRD. Plan HD today as above.  3. HTN/ vol- bp's high, hold losartan, cont other BP lowering meds. Lower vol w/ hd 4. SP TAA repair - surgery done in Nov 2020 5. H/o breast cancer - appears rx'd in 2004 and then in 2014 6. H/o back surgery 7. Anemia  - Hb 7.3, suspect due to renal failure in part. Follow, consider esa, get Fe studies 8. Nutrition - renavite, renal diet       Rob Schertz 09/22/2020, 9:15 AM   Recent Labs  Lab 09/21/20 2021 09/22/20 0447  K 4.1 3.6  BUN 114* 113*  CREATININE 14.20* 13.93*  CALCIUM 7.6* 7.4*  HGB 7.3* 6.6*   Inpatient medications: . sodium chloride   Intravenous Once  . amLODipine  10 mg Oral Daily  . Chlorhexidine Gluconate Cloth  6 each Topical Q0600  . furosemide  80 mg Intravenous Q12H  . hydrALAZINE  25 mg Oral BID  . metoprolol succinate  50 mg Oral Daily  . multivitamin  1 tablet Oral QHS  . rosuvastatin  10 mg Oral Daily   . sodium chloride    . azithromycin    . cefTRIAXone (ROCEPHIN)  IV    . heparin 800 Units/hr (09/22/20 0800)   sodium chloride, albuterol, fentaNYL (SUBLIMAZE) injection, hydrALAZINE, ondansetron **OR** ondansetron (ZOFRAN) IV, senna-docusate     Called by HD  RN to eval  Pt with severe cramps on "[place] "unrelenting cramps  with 7min hd left ,  bp 190/100, no cp or sob . Continued after uf off will stop  hd early  Today. No fevers with Blood transfusing  Cramps started before transfusion.

## 2020-09-22 NOTE — Progress Notes (Signed)
Pt. Having runs of vtach nonsustained. Pt. C/o of cramping in back and stomach overall feeling bad. Ernest Haber, PA made aware. Will see pt. At bedside

## 2020-09-23 DIAGNOSIS — I480 Paroxysmal atrial fibrillation: Secondary | ICD-10-CM | POA: Diagnosis not present

## 2020-09-23 DIAGNOSIS — I160 Hypertensive urgency: Secondary | ICD-10-CM | POA: Diagnosis not present

## 2020-09-23 DIAGNOSIS — N186 End stage renal disease: Secondary | ICD-10-CM

## 2020-09-23 DIAGNOSIS — R778 Other specified abnormalities of plasma proteins: Secondary | ICD-10-CM | POA: Diagnosis not present

## 2020-09-23 DIAGNOSIS — Z992 Dependence on renal dialysis: Secondary | ICD-10-CM

## 2020-09-23 DIAGNOSIS — T82898A Other specified complication of vascular prosthetic devices, implants and grafts, initial encounter: Secondary | ICD-10-CM

## 2020-09-23 LAB — BASIC METABOLIC PANEL
Anion gap: 13 (ref 5–15)
BUN: 65 mg/dL — ABNORMAL HIGH (ref 8–23)
CO2: 24 mmol/L (ref 22–32)
Calcium: 8.2 mg/dL — ABNORMAL LOW (ref 8.9–10.3)
Chloride: 104 mmol/L (ref 98–111)
Creatinine, Ser: 9.54 mg/dL — ABNORMAL HIGH (ref 0.44–1.00)
GFR, Estimated: 4 mL/min — ABNORMAL LOW (ref >60)
Glucose, Bld: 139 mg/dL — ABNORMAL HIGH (ref 70–99)
Potassium: 3.4 mmol/L — ABNORMAL LOW (ref 3.5–5.1)
Sodium: 141 mmol/L (ref 135–145)

## 2020-09-23 LAB — HEPARIN LEVEL (UNFRACTIONATED)
Heparin Unfractionated: 0.18 IU/mL — ABNORMAL LOW (ref 0.30–0.70)
Heparin Unfractionated: 0.3 IU/mL (ref 0.30–0.70)
Heparin Unfractionated: 0.33 IU/mL (ref 0.30–0.70)

## 2020-09-23 LAB — TYPE AND SCREEN
ABO/RH(D): O POS
Antibody Screen: NEGATIVE
Unit division: 0
Unit division: 0

## 2020-09-23 LAB — BPAM RBC
Blood Product Expiration Date: 202205012359
Blood Product Expiration Date: 202205202359
ISSUE DATE / TIME: 202204241404
ISSUE DATE / TIME: 202204241404
Unit Type and Rh: 5100
Unit Type and Rh: 5100

## 2020-09-23 LAB — PROCALCITONIN: Procalcitonin: 4.26 ng/mL

## 2020-09-23 LAB — CBC
HCT: 26 % — ABNORMAL LOW (ref 36.0–46.0)
Hemoglobin: 8.6 g/dL — ABNORMAL LOW (ref 12.0–15.0)
MCH: 28.2 pg (ref 26.0–34.0)
MCHC: 33.1 g/dL (ref 30.0–36.0)
MCV: 85.2 fL (ref 80.0–100.0)
Platelets: 275 10*3/uL (ref 150–400)
RBC: 3.05 MIL/uL — ABNORMAL LOW (ref 3.87–5.11)
RDW: 16.7 % — ABNORMAL HIGH (ref 11.5–15.5)
WBC: 7.6 10*3/uL (ref 4.0–10.5)
nRBC: 0 % (ref 0.0–0.2)

## 2020-09-23 LAB — BRAIN NATRIURETIC PEPTIDE: B Natriuretic Peptide: 2218 pg/mL — ABNORMAL HIGH (ref 0.0–100.0)

## 2020-09-23 LAB — URINE CULTURE: Culture: NO GROWTH

## 2020-09-23 MED ORDER — DARBEPOETIN ALFA 100 MCG/0.5ML IJ SOSY
100.0000 ug | PREFILLED_SYRINGE | INTRAMUSCULAR | Status: DC
  Filled 2020-09-23: qty 0.5

## 2020-09-23 MED ORDER — DM-GUAIFENESIN ER 30-600 MG PO TB12
1.0000 | ORAL_TABLET | Freq: Two times a day (BID) | ORAL | Status: DC
  Administered 2020-09-23 – 2020-09-27 (×9): 1 via ORAL
  Filled 2020-09-23 (×9): qty 1

## 2020-09-23 MED ORDER — DARBEPOETIN ALFA 100 MCG/0.5ML IJ SOSY
PREFILLED_SYRINGE | INTRAMUSCULAR | Status: AC
  Administered 2020-09-23: 100 ug via INTRAVENOUS
  Filled 2020-09-23: qty 0.5

## 2020-09-23 MED ORDER — CALCIUM ACETATE (PHOS BINDER) 667 MG PO CAPS
667.0000 mg | ORAL_CAPSULE | Freq: Three times a day (TID) | ORAL | Status: DC
  Administered 2020-09-23 – 2020-09-27 (×11): 667 mg via ORAL
  Filled 2020-09-23 (×12): qty 1

## 2020-09-23 MED ORDER — CHLORHEXIDINE GLUCONATE CLOTH 2 % EX PADS
6.0000 | MEDICATED_PAD | Freq: Every day | CUTANEOUS | Status: DC
  Administered 2020-09-24: 6 via TOPICAL

## 2020-09-23 MED ORDER — SODIUM CHLORIDE 0.9 % IV SOLN
250.0000 mg | Freq: Every day | INTRAVENOUS | Status: DC
  Administered 2020-09-23 – 2020-09-25 (×2): 250 mg via INTRAVENOUS
  Filled 2020-09-23 (×3): qty 20

## 2020-09-23 MED ORDER — HEPARIN SODIUM (PORCINE) 1000 UNIT/ML IJ SOLN
INTRAMUSCULAR | Status: AC
  Administered 2020-09-23: 1000 [IU]
  Filled 2020-09-23: qty 3

## 2020-09-23 NOTE — Progress Notes (Addendum)
[facility] NEPHROLOGY PROGRESS NOTE  Assessment/ Plan: Pt is a 70 y.o. 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  #Acute kidney injury on CKD stage V: Baseline creatinine level around 5.5.  She required dialysis in the past and then had some recovery.  Now, presented with creatinine level 14 with volume overload.  Not responding with IV diuretics.  She now has progression to ESRD therefore initiated dialysis on 4/25.  Plan for second HD today.  #Dialysis Access: Left upper extremity AV fistula was created by Dr. Donzetta Matters in 04/2019.  Tried to use initially without success and had infiltration.  She developed left arm swelling and tender.  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  IR placed right EJ temporary HD catheter on 4/24.  Given severe pain and swelling, I consulted vascular surgeon and discussed with Dr. Oneida Alar today.  #Acute respiratory failure with hypoxia/shortness of breath: Due to pulmonary edema, ? CAP.  Volume management by dialysis.  On antibiotics for CAP.   #Hypertension/volume up: Continue antihypertensive medication.  Hold losartan.  UF with HD.  #Anemia due to CKD: Iron saturation 5%.  Order IV iron and ESA.  Received PRBC.  Monitor hemoglobin.  # CKD-MBD, secondary hyperparathyroidism: Hyperphosphatemia noted.  Start calcium acetate.  #Chronic diastolic heart failure: Cardiology is following.  Subjective: Seen and examined at bedside.  Patient is complaining of left arm pain and swelling.  She had dialysis yesterday with around 500 cc UF. Objective Vital signs in last 24 hours: Vitals:   09/22/20 2200 09/23/20 0000 09/23/20 0428 09/23/20 0719  BP: (!) 174/112 (!) 170/109 (!) 167/94 (!) 180/127  Pulse: 87 88 81 86  Resp: 20 18 20 19   Temp:  98.8 F (37.1 C) 98.8  F (37.1 C)   TempSrc:  Oral Oral   SpO2: 97% 97% 98% 95%  Weight:      Height:       Weight change: 4.2 kg  Intake/Output Summary (Last 24 hours) at 09/23/2020 0807 Last data filed at 09/23/2020 1884 Gross per 24 hour  Intake 1062.61 ml  Output 1075 ml  Net -12.39 ml       Labs: Basic Metabolic Panel: Recent Labs  Lab 09/22/20 0447 09/22/20 1303 09/23/20 0400  NA 142 139 141  K 3.6 3.5 3.4*  CL 107 105 104  CO2 16* 18* 24  GLUCOSE 111* 121* 139*  BUN 113* 113* 65*  CREATININE 13.93* 13.98* 9.54*  CALCIUM 7.4* 7.2* 8.2*  PHOS  --  8.2*  --    Liver Function Tests: Recent Labs  Lab 09/21/20 2021 09/22/20 1303  AST 32  --   ALT 27  --   ALKPHOS 114  --   BILITOT 0.8  --   PROT 6.8  --   ALBUMIN 3.0* 2.5*   No results for input(s): LIPASE, AMYLASE in the last 168 hours. No results for input(s): AMMONIA in the last 168 hours. CBC: Recent Labs  Lab 09/21/20 2021 09/22/20 0447 09/22/20 1250 09/22/20 2023 09/23/20 0400  WBC 8.4 7.0 6.4  --  7.6  NEUTROABS 7.0  --   --   --   --   HGB 7.3* 6.6* 5.5* 8.5* 8.6*  HCT 23.4* 21.0* 17.9* 25.3* 26.0*  MCV 91.4 90.1 90.9  --  85.2  PLT 411* 372 330  --  275   Cardiac Enzymes: No results for input(s): CKTOTAL, CKMB, CKMBINDEX, TROPONINI in the last 168 hours. CBG: No results for input(s): GLUCAP in the last 168 hours.  Iron Studies:  Recent Labs    09/22/20 0447  IRON 11*  TIBC 234*  FERRITIN 198   Studies/Results: IR Venocavagram Svc  Result Date: 09/22/2020 CLINICAL DATA:  Renal failure, progressive. Previously placed dialysis fistula could not be utilized for dialysis and a temporary hemodialysis catheter is requested. EXAM: EXAM LEFT JUGULAR VENOGRAM RIGHT IJ CATHETER PLACEMENT UNDER  FLUOROSCOPIC GUIDANCE ULTRASOUND GUIDANCE FOR VASCULAR ACCESS X 2 TECHNIQUE: The procedure, risks (including but not limited to bleeding, infection, organ damage, pneumothorax), benefits, and alternatives were explained  to the patient. Questions regarding the procedure were encouraged and answered. The patient understands and consents to the procedure. Initially, a left-sided approach was selected due to the available catheters. Patency of the left IJ vein was confirmed with ultrasound with image documentation. An appropriate skin site was determined. Skin site was marked. Region was prepped using maximum barrier technique including cap and mask, sterile gown, sterile gloves, large sterile sheet, and Chlorhexidine as cutaneous antisepsis. The region was infiltrated locally with 1% lidocaine. Under real-time ultrasound guidance, the vein was accessed with a 21-gauge micropuncture needle, exchanged over a 018 guidewire for a transitional dilator, through which a 035 guidewire was advanced. This would not pass centrally to the SVC. Venogram was performed. 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 Patency of the right external jugular vein was confirmed with ultrasound with image documentation. An appropriate skin site was determined. Skin site was marked. Region was prepped using maximum barrier technique including cap and mask, sterile gown, sterile gloves, large sterile sheet, and Chlorhexidine as cutaneous antisepsis. The region was infiltrated locally with 1% lidocaine. Under real-time ultrasound guidance, the vein was accessed with a 21-gauge micropuncture needle, exchanged over a 018 guidewire for a transitional dilator, through which a 035 guidewire was advanced, allowing passage of vascular dilator which allowed advancement of a 20 cm Trialysis catheter. This was positioned with the tip mid right atrium. Spot chest radiograph shows good positioning and no pneumothorax. Catheter was flushed and sutured externally with 0-Prolene sutures. Patient tolerated the procedure well. FLUOROSCOPY TIME:  54 seconds; 09.3 mGy  COMPLICATIONS: COMPLICATIONS none IMPRESSION: 1. 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 2. Technically successful right EJ Trialysis catheter placement. Electronically Signed   By: Lucrezia Europe M.D.   On: 09/22/2020 12:48   IR Fluoro Guide CV Line Right  Result Date: 09/22/2020 CLINICAL DATA:  Renal failure, progressive. Previously placed dialysis fistula could not be utilized for dialysis and a temporary hemodialysis catheter is requested. EXAM: EXAM LEFT JUGULAR VENOGRAM RIGHT IJ CATHETER PLACEMENT UNDER  FLUOROSCOPIC GUIDANCE ULTRASOUND GUIDANCE FOR VASCULAR ACCESS X 2 TECHNIQUE: The procedure, risks (including but not limited to bleeding, infection, organ damage, pneumothorax), benefits, and alternatives were explained to the patient. Questions regarding the procedure were encouraged and answered. The patient understands and consents to the procedure. Initially, a left-sided approach was selected due to the available catheters. Patency of the left IJ vein was confirmed with ultrasound with image documentation. An appropriate skin site was determined. Skin site was marked. Region was prepped using maximum barrier technique including cap and mask, sterile gown, sterile gloves, large sterile sheet, and Chlorhexidine as cutaneous  antisepsis. The region was infiltrated locally with 1% lidocaine. Under real-time ultrasound guidance, the vein was accessed with a 21-gauge micropuncture needle, exchanged over a 018 guidewire for a transitional dilator, through which a 035 guidewire was advanced. This would not pass centrally to the SVC. Venogram was performed. 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 Patency of the right external jugular vein was confirmed with ultrasound with image documentation. An appropriate skin  site was determined. Skin site was marked. Region was prepped using maximum barrier technique including cap and mask, sterile gown, sterile gloves, large sterile sheet, and Chlorhexidine as cutaneous antisepsis. The region was infiltrated locally with 1% lidocaine. Under real-time ultrasound guidance, the vein was accessed with a 21-gauge micropuncture needle, exchanged over a 018 guidewire for a transitional dilator, through which a 035 guidewire was advanced, allowing passage of vascular dilator which allowed advancement of a 20 cm Trialysis catheter. This was positioned with the tip mid right atrium. Spot chest radiograph shows good positioning and no pneumothorax. Catheter was flushed and sutured externally with 0-Prolene sutures. Patient tolerated the procedure well. FLUOROSCOPY TIME:  54 seconds; 67.2 mGy COMPLICATIONS: COMPLICATIONS none IMPRESSION: 1. 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 2. Technically successful right EJ Trialysis catheter placement. Electronically Signed   By: Lucrezia Europe M.D.   On: 09/22/2020 12:48   IR US Guide Vasc Access Left  Result Date: 09/22/2020 CLINICAL DATA:  Renal failure, progressive. Previously placed dialysis fistula could not be utilized for dialysis and a temporary hemodialysis catheter is requested. EXAM: EXAM LEFT JUGULAR VENOGRAM RIGHT IJ CATHETER PLACEMENT UNDER  FLUOROSCOPIC GUIDANCE ULTRASOUND GUIDANCE FOR VASCULAR ACCESS X 2 TECHNIQUE: The procedure, risks (including but not limited to bleeding, infection, organ damage, pneumothorax), benefits, and alternatives were explained to the patient. Questions regarding the procedure were encouraged and answered. The patient understands and consents to the procedure. Initially, a left-sided approach was selected due to the available catheters. Patency of the left IJ vein was confirmed with ultrasound with image documentation. An  appropriate skin site was determined. Skin site was marked. Region was prepped using maximum barrier technique including cap and mask, sterile gown, sterile gloves, large sterile sheet, and Chlorhexidine as cutaneous antisepsis. The region was infiltrated locally with 1% lidocaine. Under real-time ultrasound guidance, the vein was accessed with a 21-gauge micropuncture needle, exchanged over a 018 guidewire for a transitional dilator, through which a 035 guidewire was advanced. This would not pass centrally to the SVC. Venogram was performed. 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 Patency of the right external jugular vein was confirmed with ultrasound with image documentation. An appropriate skin site was determined. Skin site was marked. Region was prepped using maximum barrier technique including cap and mask, sterile gown, sterile gloves, large sterile sheet, and Chlorhexidine as cutaneous antisepsis. The region was infiltrated locally with 1% lidocaine. Under real-time ultrasound guidance, the vein was accessed with a 21-gauge micropuncture needle, exchanged over a 018 guidewire for a transitional dilator, through which a 035 guidewire was advanced, allowing passage of vascular dilator which allowed advancement of a 20 cm Trialysis catheter. This was positioned with the tip mid right atrium. Spot chest radiograph shows good positioning and no pneumothorax. Catheter was flushed and sutured externally with 0-Prolene sutures. Patient  tolerated the procedure well. FLUOROSCOPY TIME:  54 seconds; 14.4 mGy COMPLICATIONS: COMPLICATIONS none IMPRESSION: 1. 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 2. Technically successful right EJ Trialysis catheter placement. Electronically Signed   By: Lucrezia Europe M.D.   On: 09/22/2020  12:48   IR US Guide Vasc Access Right  Result Date: 09/22/2020 CLINICAL DATA:  Renal failure, progressive. Previously placed dialysis fistula could not be utilized for dialysis and a temporary hemodialysis catheter is requested. EXAM: EXAM LEFT JUGULAR VENOGRAM RIGHT IJ CATHETER PLACEMENT UNDER  FLUOROSCOPIC GUIDANCE ULTRASOUND GUIDANCE FOR VASCULAR ACCESS X 2 TECHNIQUE: The procedure, risks (including but not limited to bleeding, infection, organ damage, pneumothorax), benefits, and alternatives were explained to the patient. Questions regarding the procedure were encouraged and answered. The patient understands and consents to the procedure. Initially, a left-sided approach was selected due to the available catheters. Patency of the left IJ vein was confirmed with ultrasound with image documentation. An appropriate skin site was determined. Skin site was marked. Region was prepped using maximum barrier technique including cap and mask, sterile gown, sterile gloves, large sterile sheet, and Chlorhexidine as cutaneous antisepsis. The region was infiltrated locally with 1% lidocaine. Under real-time ultrasound guidance, the vein was accessed with a 21-gauge micropuncture needle, exchanged over a 018 guidewire for a transitional dilator, through which a 035 guidewire was advanced. This would not pass centrally to the SVC. Venogram was performed. 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 Patency of the right external jugular vein was confirmed with ultrasound with image documentation. An appropriate skin site was determined. Skin site was marked. Region was prepped using maximum barrier technique including cap and mask, sterile gown, sterile gloves, large sterile sheet, and Chlorhexidine as cutaneous antisepsis. The region was infiltrated locally with 1% lidocaine. Under real-time ultrasound guidance, the  vein was accessed with a 21-gauge micropuncture needle, exchanged over a 018 guidewire for a transitional dilator, through which a 035 guidewire was advanced, allowing passage of vascular dilator which allowed advancement of a 20 cm Trialysis catheter. This was positioned with the tip mid right atrium. Spot chest radiograph shows good positioning and no pneumothorax. Catheter was flushed and sutured externally with 0-Prolene sutures. Patient tolerated the procedure well. FLUOROSCOPY TIME:  54 seconds; 31.5 mGy COMPLICATIONS: COMPLICATIONS none IMPRESSION: 1. 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 2. Technically successful right EJ Trialysis catheter placement. Electronically Signed   By: Lucrezia Europe M.D.   On: 09/22/2020 12:48   DG Chest Port 1 View  Result Date: 09/21/2020 CLINICAL DATA:  Shortness of breath, fever EXAM: PORTABLE CHEST 1 VIEW COMPARISON:  Chest radiograph dated 11/30/2019 FINDINGS: The heart is enlarged. Mild-to-moderate bilateral lower lung predominant airspace opacities are noted. A small left pleural effusion may contribute. There is no right pleural effusion. There is no pneumothorax. Degenerative changes are seen in the spine and right shoulder. IMPRESSION: 1. Mild-to-moderate bilateral lower lung predominant airspace opacities likely represent pneumonia. A small left pleural effusion may contribute. 2. Cardiomegaly. Electronically Signed   By: Zerita Boers M.D.   On: 09/21/2020 20:53   ECHOCARDIOGRAM COMPLETE  Result Date: 09/22/2020    ECHOCARDIOGRAM REPORT   Patient Name:   Monique Henderson Date of Exam: 09/22/2020 Medical Rec #:  [id number]  Height:       63.0 in Accession #:    [id number]         Weight:       108.0 lb Date of Birth:  [birth year]           BSA:          1.488 m Patient Age:    88 years           BP:           125/90 mmHg Patient Gender: F                  HR:            96 bpm. Exam Location:  Inpatient Procedure: 2D Echo, Cardiac Doppler and Color Doppler Indications:    R07.9* Chest pain, unspecified  History:        Patient has prior history of Echocardiogram examinations, most                 recent 08/28/2019. Stroke, Arrythmias:Atrial Fibrillation,                 Signs/Symptoms:Shortness of Breath and Chest Pain; Risk                 Factors:Hypertension.  Sonographer:    Bernadene Person RDCS Referring Phys: [id number] ROBIN [place]  1. Apical hypokinesis with overall normal LV function.  2. Left ventricular ejection fraction, by estimation, is 55 to 60%. The left ventricle has normal function. The left ventricle demonstrates regional wall motion abnormalities (see scoring diagram/findings for description). There is moderate left ventricular hypertrophy. Left ventricular diastolic parameters are consistent with Grade II diastolic dysfunction (pseudonormalization).  3. Right ventricular systolic function is normal. The right ventricular size is normal. There is mildly elevated pulmonary artery systolic pressure.  4. Left atrial size was severely dilated.  5. Right atrial size was mildly dilated.  6. The mitral valve is normal in structure. Mild mitral valve regurgitation. No evidence of mitral stenosis.  7. The aortic valve is tricuspid. Aortic valve regurgitation is not visualized. No aortic stenosis is present.  8. The inferior vena cava is normal in size with greater than 50% respiratory variability, suggesting right atrial pressure of 3 mmHg. FINDINGS  Left Ventricle: Left ventricular ejection fraction, by estimation, is 55 to 60%. The left ventricle has normal function. The left ventricle demonstrates regional wall motion abnormalities. The left ventricular internal cavity size was normal in size. There is moderate left ventricular hypertrophy. Left ventricular diastolic parameters are consistent with Grade II diastolic dysfunction (pseudonormalization). Right  Ventricle: The right ventricular size is normal. Right ventricular systolic function is normal. There is mildly elevated pulmonary artery systolic pressure. The tricuspid regurgitant velocity is 3.24 m/s, and with an assumed right atrial pressure of 3 mmHg, the estimated right ventricular systolic pressure is 29.7 mmHg. Left Atrium: Left atrial size was severely dilated. Right Atrium: Right atrial size was mildly dilated. Pericardium: There is no evidence of pericardial effusion. Mitral Valve: The mitral valve is normal in structure. Mild mitral valve regurgitation. No evidence of mitral valve stenosis. Tricuspid Valve: The tricuspid valve is normal in structure. Tricuspid valve regurgitation is mild . No evidence of tricuspid stenosis. Aortic Valve: The aortic valve is tricuspid. Aortic valve regurgitation is not visualized. No aortic stenosis is present. Pulmonic Valve: The pulmonic valve was normal in structure. Pulmonic valve regurgitation is not visualized. No evidence of pulmonic stenosis. Aorta: The aortic root  is normal in size and structure. Venous: The inferior vena cava is normal in size with greater than 50% respiratory variability, suggesting right atrial pressure of 3 mmHg. IAS/Shunts: The interatrial septum is aneurysmal. No atrial level shunt detected by color flow Doppler. Additional Comments: Apical hypokinesis with overall normal LV function.  LEFT VENTRICLE PLAX 2D LVIDd:         3.60 cm LVIDs:         2.80 cm LV PW:         1.50 cm LV IVS:        1.10 cm LVOT diam:     1.80 cm LV SV:         63 LV SV Index:   42 LVOT Area:     2.54 cm  LV Volumes (MOD) LV vol d, MOD A2C: 112.0 ml LV vol d, MOD A4C: 95.9 ml LV vol s, MOD A2C: 49.9 ml LV vol s, MOD A4C: 48.0 ml LV SV MOD A2C:     62.1 ml LV SV MOD A4C:     95.9 ml LV SV MOD BP:      53.4 ml RIGHT VENTRICLE TAPSE (M-mode): 1.7 cm LEFT ATRIUM             Index       RIGHT ATRIUM           Index LA diam:        3.20 cm 2.15 cm/m  RA Area:     18.10  cm LA Vol (A2C):   82.0 ml 55.10 ml/m RA Volume:   46.30 ml  31.11 ml/m LA Vol (A4C):   75.0 ml 50.39 ml/m LA Biplane Vol: 82.4 ml 55.37 ml/m  AORTIC VALVE LVOT Vmax:   149.50 cm/s LVOT Vmean:  100.150 cm/s LVOT VTI:    0.246 m  AORTA Ao Root diam: 2.60 cm Ao Asc diam:  2.80 cm MR PISA:        0.57 cm TRICUSPID VALVE MR PISA Radius: 0.30 cm  TR Peak grad:   42.0 mmHg                          TR Vmax:        324.00 cm/s                           SHUNTS                          Systemic VTI:  0.25 m                          Systemic Diam: 1.80 cm Kirk Ruths MD Electronically signed by Kirk Ruths MD Signature Date/Time: 09/22/2020/11:02:07 AM    Final     Medications: Infusions: . sodium chloride    . cefTRIAXone (ROCEPHIN)  IV 2 g (09/22/20 2052)  . heparin 1,000 Units/hr (09/23/20 6269)    Scheduled Medications: . sodium chloride   Intravenous Once  . amLODipine  10 mg Oral Daily  . Chlorhexidine Gluconate Cloth  6 each Topical Q0600  . doxycycline  100 mg Oral Q12H  . furosemide  80 mg Intravenous Q12H  . hydrALAZINE  25 mg Oral BID  . metoprolol succinate  75 mg Oral Daily  . multivitamin  1 tablet Oral QHS  . nitroGLYCERIN  0.2 mg Transdermal  Daily  . rosuvastatin  10 mg Oral Daily    have reviewed scheduled and prn medications.  Physical Exam: General:NAD, comfortable Heart:RRR, s1s2 nl Lungs: Bibasal rhonchi. Abdomen:soft, Non-tender, non-distended Extremities:No edema Dialysis Access: Right is a temporary HD catheter.  Left upper extremity is swollen and tender.  Blyss Lugar Tanna Furry 09/23/2020,8:07 AM  LOS: 2 days

## 2020-09-23 NOTE — Progress Notes (Addendum)
Heart Failure Navigator Progress Note  Assessed for Heart & Vascular TOC clinic readiness.  Unfortunately at this time the patient does not meet criteria due to CKD V, baseline SCr 5.5; progressed to ESRD (SCr 14+), most recent 9.54. Pt receiving HD this admission for volume overload.   Navigator available for reassessment of patient.   Pricilla Holm, RN, BSN Heart Failure Nurse Navigator [phone]

## 2020-09-23 NOTE — Progress Notes (Signed)
[facility] for Heparin Indication: atrial fibrillation  Allergies  Allergen Reactions  . Shrimp [Shellfish Allergy] Shortness Of Breath  . Bactroban [Mupirocin] Other (See Comments)    "Sores in nose"  . Vicodin [Hydrocodone-Acetaminophen] Itching and Nausea And Vomiting    This is patient's home medication  . Eliquis [Apixaban] Rash  . Lisinopril Cough  . Tylenol [Acetaminophen] Itching    Patient Measurements: Height: 5\' 3"  (160 cm) Weight: 47.3 kg (104 lb 4.4 oz) IBW/kg (Calculated) : 52.4  Vital Signs: Temp: 97.6 F (36.4 C) (04/25 1334) Temp Source: Oral (04/25 1334) BP: 154/93 (04/25 1530) Pulse Rate: 80 (04/25 1530)  Labs: Recent Labs    09/21/20 2021 09/21/20 2300 09/22/20 0447 09/22/20 0618 09/22/20 1250 09/22/20 1303 09/22/20 2023 09/23/20 0400 09/23/20 0430 09/23/20 1454  HGB 7.3*  --  6.6*  --  5.5*  --  8.5* 8.6*  --   --   HCT 23.4*  --  21.0*  --  17.9*  --  25.3* 26.0*  --   --   PLT 411*  --  372  --  330  --   --  275  --   --   APTT 40*  --   --   --   --   --   --   --   --   --   LABPROT 14.4  --   --   --   --   --   --   --   --   --   INR 1.1  --   --   --   --   --   --   --   --   --   HEPARINUNFRC  --   --   --    < >  --   --  0.19*  --  0.18* 0.30  CREATININE 14.20*  --  13.93*  --   --  13.98*  --  9.54*  --   --   TROPONINIHS 120* 141*  --   --   --   --   --   --   --   --    < > = values in this interval not displayed.    Estimated Creatinine Clearance: 4.2 mL/min (A) (by C-G formula based on SCr of 9.54 mg/dL (H)).   Medical History: Past Medical History:  Diagnosis Date  . AF (paroxysmal atrial fibrillation) ([place]) 05/29/2019  . Aortic atherosclerosis ([place]) 07/05/2019  . Aortic dissection ([place]) 04/04/2019  . Atelectasis 2002   Bilateral  . Atrial fibrillation with RVR ([place]) 05/18/2019  . Atypical chest pain 07/05/2019  . Bone spur 2008   Right calcaneal foot spur  . Breast cancer  ([place]) 2004   Ductal carcinoma in situ of the left breast; S/P left partial mastectomy 02/26/2003; S/P re-excision of cranial and lateral margins11/18/2004.radiation  . Breast cancer ([place]) 09/21/2012   right breast/ last radiation treatment 03/22/2013  . Cerebral thrombosis with cerebral infarction 05/22/2019  . Chronic kidney disease, stage IV (severe) ([place]) 10/10/2007  . Chronic low back pain 06/22/2016  . Chronic obstructive lung disease ([place]) 01/16/2017  . CKD (chronic kidney disease), stage IV ([place]) 07/19/2017  . 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 Autumn Messing; S/P re-excision of superior and inferior margins 10/27/2012.   . Dissection of aorta ([place]) 04/03/2019  . Elevated troponin 08/28/2019  . ESRD  on dialysis ([place]) 05/29/2019  . Essential hypertension 09/16/2006  . GERD 09/16/2006  . GERD (gastroesophageal reflux disease)   . H/O: stroke 05/29/2019  . 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 Hot flashes   . Hx of radiation therapy 2005   left breast  . Hx of radiation therapy 01/11/13- 03/22/13   right breast 5760 cGy 30 sessions  . Hyperkalemia 08/28/2019  . Hypertension   . Insomnia 03/14/2015  . Low back pain   . Lumbar spinal stenosis    S/P lumbar decompressive laminectomy, fusion and plating for lumbar spinal stensosis  . Malnutrition of moderate degree 05/19/2019  . Non compliance with medical treatment 12/04/2017  . Normocytic anemia    With thrombocytosis  . On Coumadin for atrial fibrillation ([place]) 10/03/2019  . Osteoarthritis   . Paroxysmal SVT (supraventricular tachycardia) ([place]) 08/27/2019  . Personal history of radiation therapy   . Pleural effusion 05/18/2019  . Positive D dimer 08/28/2019  . Right ureteral stone 2002  . S/P aortic aneurysm repair 04/07/2019  . S/P lumbar spinal fusion 01/18/2014   S/P lumbar decompressive laminectomy, fusion, and plating for lumbar spinal stenosis on  05/27/2009 by Dr. Eustace Moore.  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 Eustace Moore.    . Shortness of breath    from pain  . SIRS (systemic inflammatory response syndrome) ([place]) 08/27/2019  . SVT (supraventricular tachycardia) ([place]) 08/28/2019  . Tobacco use disorder 04/19/2009  . Uterine fibroid   . Wears dentures    top    Medications:  No current facility-administered medications on file prior to encounter.   Current Outpatient Medications on File Prior to Encounter  Medication Sig Dispense Refill  . albuterol (VENTOLIN HFA) 108 (90 Base) MCG/ACT inhaler Inhale 2 puffs into the lungs every 6 (six) hours as needed for wheezing or shortness of breath. 8.5 g 0  . amLODipine (NORVASC) 10 MG tablet TAKE 1 TABLET (10 MG TOTAL) BY MOUTH DAILY. 90 tablet 0  . cloNIDine (CATAPRES - DOSED IN MG/24 HR) 0.3 mg/24hr patch Place 1 patch (0.3 mg total) onto the skin once a week. 4 patch 2  . furosemide (LASIX) 20 MG tablet Take 1 tablet (20 mg total) by mouth daily. 60 tablet 5  . hydrALAZINE (APRESOLINE) 25 MG tablet Take 1 tablet (25 mg total) by mouth 2 (two) times daily.    Marland Kitchen losartan (COZAAR) 100 MG tablet Take 100 mg by mouth at bedtime.    . metoprolol succinate (TOPROL-XL) 50 MG 24 hr tablet Take 1 tablet (50 mg total) by mouth daily. Take with or immediately following a meal. 90 tablet 1  . mirtazapine (REMERON) 7.5 MG tablet Take 1 tablet (7.5 mg total) by mouth at bedtime. 90 tablet 3  . ondansetron (ZOFRAN) 4 MG tablet Take 1 tablet (4 mg total) by mouth every 8 (eight) hours as needed for nausea or vomiting. 20 tablet 3  . oxyCODONE-acetaminophen (PERCOCET/ROXICET) 5-325 MG tablet Take 1 tablet by mouth 3 (three) times daily as needed for severe pain (pain.).    Marland Kitchen rosuvastatin (CRESTOR) 10  MG tablet TAKE 1 TABLET (10 MG TOTAL) BY MOUTH DAILY. 30 tablet 0  . warfarin (COUMADIN) 6 MG tablet TAKE 1 TABLET (6 MG TOTAL) BY MOUTH 4 (FOUR) TIMES A  WEEK. 30 tablet 0  . warfarin (COUMADIN) 7.5 MG tablet Take 1 tablet (7.5 mg total) by mouth 3 (three) times a week. 90 tablet 0     Assessment: 70 y.o. female with h/o Afib on Coumadin, INR subtherapeutic, for heparin.  Noted pt required higher than usual heparin rates during previous admission 07/2019.  Heparin level within goal range this afternoon, Hgb appears stable at 8.6 this AM.  No overt bleeding or complications noted.  Goal of Therapy:  Heparin level 0.3-0.7 units/mL Monitor platelets by anticoagulation protocol: Yes   Plan:  Continue IV heparin at current rate of 1000 units/hr. Repeat heparin level in 8 hrs. Daily heparin level and CBC.  [place] Crane, Roylene Reason, BCCP Clinical Pharmacist  09/23/2020 3:54 PM   [facility] pharmacy phone numbers are listed on [email or web address]

## 2020-09-23 NOTE — Plan of Care (Signed)

## 2020-09-23 NOTE — Progress Notes (Signed)
[facility] for Heparin Indication: atrial fibrillation  Allergies  Allergen Reactions  . Shrimp [Shellfish Allergy] Shortness Of Breath  . Bactroban [Mupirocin] Other (See Comments)    "Sores in nose"  . Vicodin [Hydrocodone-Acetaminophen] Itching and Nausea And Vomiting    This is patient's home medication  . Eliquis [Apixaban] Rash  . Lisinopril Cough  . Tylenol [Acetaminophen] Itching    Patient Measurements: Height: 5\' 3"  (160 cm) Weight: 50.9 kg (112 lb 3.4 oz) IBW/kg (Calculated) : 52.4  Vital Signs: Temp: 98.8 F (37.1 C) (04/25 0428) Temp Source: Oral (04/25 0428) BP: 167/94 (04/25 0428) Pulse Rate: 81 (04/25 0428)  Labs: Recent Labs    09/21/20 2021 09/21/20 2300 09/22/20 0447 09/22/20 0618 09/22/20 1250 09/22/20 1303 09/22/20 2023 09/23/20 0400 09/23/20 0430  HGB 7.3*  --  6.6*  --  5.5*  --  8.5* 8.6*  --   HCT 23.4*  --  21.0*  --  17.9*  --  25.3* 26.0*  --   PLT 411*  --  372  --  330  --   --  275  --   APTT 40*  --   --   --   --   --   --   --   --   LABPROT 14.4  --   --   --   --   --   --   --   --   INR 1.1  --   --   --   --   --   --   --   --   HEPARINUNFRC  --   --   --  0.13*  --   --  0.19*  --  0.18*  CREATININE 14.20*  --  13.93*  --   --  13.98*  --   --   --   TROPONINIHS 120* 141*  --   --   --   --   --   --   --     Estimated Creatinine Clearance: 3.1 mL/min (A) (by C-G formula based on SCr of 13.98 mg/dL (H)).   Medical History: Past Medical History:  Diagnosis Date  . AF (paroxysmal atrial fibrillation) ([place]) 05/29/2019  . Aortic atherosclerosis ([place]) 07/05/2019  . Aortic dissection ([place]) 04/04/2019  . Atelectasis 2002   Bilateral  . Atrial fibrillation with RVR ([place]) 05/18/2019  . Atypical chest pain 07/05/2019  . Bone spur 2008   Right calcaneal foot spur  . Breast cancer ([place]) 2004   Ductal carcinoma in situ of the left breast; S/P left partial mastectomy 02/26/2003; S/P  re-excision of cranial and lateral margins11/18/2004.radiation  . Breast cancer ([place]) 09/21/2012   right breast/ last radiation treatment 03/22/2013  . Cerebral thrombosis with cerebral infarction 05/22/2019  . Chronic kidney disease, stage IV (severe) ([place]) 10/10/2007  . Chronic low back pain 06/22/2016  . Chronic obstructive lung disease ([place]) 01/16/2017  . CKD (chronic kidney disease), stage IV ([place]) 07/19/2017  . 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 Autumn Messing; S/P re-excision of superior and inferior margins 10/27/2012.   . Dissection of aorta ([place]) 04/03/2019  . Elevated troponin 08/28/2019  . ESRD on dialysis ([place]) 05/29/2019  . Essential hypertension 09/16/2006  . GERD 09/16/2006  . GERD (gastroesophageal reflux disease)   . H/O: stroke 05/29/2019  . 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  Hot flashes   . Hx of radiation therapy 2005   left breast  . Hx of radiation therapy 01/11/13- 03/22/13   right breast 5760 cGy 30 sessions  . Hyperkalemia 08/28/2019  . Hypertension   . Insomnia 03/14/2015  . Low back pain   . Lumbar spinal stenosis    S/P lumbar decompressive laminectomy, fusion and plating for lumbar spinal stensosis  . Malnutrition of moderate degree 05/19/2019  . Non compliance with medical treatment 12/04/2017  . Normocytic anemia    With thrombocytosis  . On Coumadin for atrial fibrillation ([place]) 10/03/2019  . Osteoarthritis   . Paroxysmal SVT (supraventricular tachycardia) ([place]) 08/27/2019  . Personal history of radiation therapy   . Pleural effusion 05/18/2019  . Positive D dimer 08/28/2019  . Right ureteral stone 2002  . S/P aortic aneurysm repair 04/07/2019  . S/P lumbar spinal fusion 01/18/2014   S/P lumbar decompressive laminectomy, fusion, and plating for lumbar spinal stenosis on 05/27/2009 by Dr. Eustace Moore.  S/P anterolateral retroperitoneal interbody fusion L2-3 utilizing a  8 mm peek interbody cage packed with morcellized allograft, and anterior lumbar plating L2-3 for recurrent disc herniation L2-3 with spinal stenosis on 01/18/2014 by Dr. Eustace Moore.    . Shortness of breath    from pain  . SIRS (systemic inflammatory response syndrome) ([place]) 08/27/2019  . SVT (supraventricular tachycardia) ([place]) 08/28/2019  . Tobacco use disorder 04/19/2009  . Uterine fibroid   . Wears dentures    top    Medications:  No current facility-administered medications on file prior to encounter.   Current Outpatient Medications on File Prior to Encounter  Medication Sig Dispense Refill  . albuterol (VENTOLIN HFA) 108 (90 Base) MCG/ACT inhaler Inhale 2 puffs into the lungs every 6 (six) hours as needed for wheezing or shortness of breath. 8.5 g 0  . amLODipine (NORVASC) 10 MG tablet TAKE 1 TABLET (10 MG TOTAL) BY MOUTH DAILY. 90 tablet 0  . cloNIDine (CATAPRES - DOSED IN MG/24 HR) 0.3 mg/24hr patch Place 1 patch (0.3 mg total) onto the skin once a week. 4 patch 2  . furosemide (LASIX) 20 MG tablet Take 1 tablet (20 mg total) by mouth daily. 60 tablet 5  . hydrALAZINE (APRESOLINE) 25 MG tablet Take 1 tablet (25 mg total) by mouth 2 (two) times daily.    Marland Kitchen losartan (COZAAR) 100 MG tablet Take 100 mg by mouth at bedtime.    . metoprolol succinate (TOPROL-XL) 50 MG 24 hr tablet Take 1 tablet (50 mg total) by mouth daily. Take with or immediately following a meal. 90 tablet 1  . mirtazapine (REMERON) 7.5 MG tablet Take 1 tablet (7.5 mg total) by mouth at bedtime. 90 tablet 3  . ondansetron (ZOFRAN) 4 MG tablet Take 1 tablet (4 mg total) by mouth every 8 (eight) hours as needed for nausea or vomiting. 20 tablet 3  . oxyCODONE-acetaminophen (PERCOCET/ROXICET) 5-325 MG tablet Take 1 tablet by mouth 3 (three) times daily as needed for severe pain (pain.).    Marland Kitchen rosuvastatin (CRESTOR) 10 MG tablet TAKE 1 TABLET (10 MG TOTAL) BY MOUTH DAILY. 30 tablet 0  . warfarin (COUMADIN) 6 MG tablet  TAKE 1 TABLET (6 MG TOTAL) BY MOUTH 4 (FOUR) TIMES A WEEK. 30 tablet 0  . warfarin (COUMADIN) 7.5 MG tablet Take 1 tablet (7.5 mg total) by mouth 3 (three) times a week. 90 tablet 0     Assessment: 70 y.o. female with h/o Afib on Coumadin, INR  subtherapeutic, for heparin.  Noted pt required higher than usual heparin rates during previous admission 07/2019.  Heparin level came back subtherapeutic at 0.19 on 800 units/hr. Hgb was 5.5 this afternoon - now received 2 PRBCs, Hgb up to 8.5. No s/sx of bleeding noticed by RN - no infusion issues.   4/25 AM update:  Heparin level low Hgb remains stable at 8.6 after blood yesterday  Goal of Therapy:  Heparin level 0.3-0.7 units/mL Monitor platelets by anticoagulation protocol: Yes   Plan:  Inc heparin to 1000 units/hr 1300 heparin level  Narda Bonds, PharmD, BCPS Clinical Pharmacist Phone: [phone]

## 2020-09-23 NOTE — Progress Notes (Addendum)
Progress Note  Patient Name: Monique Henderson Date of Encounter: 09/23/2020  [facility] Cardiologist: No primary care provider on file.   Subjective   Admitted 04/23 with symptomatic anemia, acute renal failure with volume overload. Overnight HD was attempted but not successful because of AVF infiltration. Had percutaneous HD cath placement 04/24 with HD and PRBC transfusion.  04/25: severe R arm pain, very upset, feels palps at time  Inpatient Medications    Scheduled Meds:  sodium chloride   Intravenous Once   amLODipine  10 mg Oral Daily   Chlorhexidine Gluconate Cloth  6 each Topical Q0600   doxycycline  100 mg Oral Q12H   furosemide  80 mg Intravenous Q12H   hydrALAZINE  25 mg Oral BID   metoprolol succinate  75 mg Oral Daily   multivitamin  1 tablet Oral QHS   nitroGLYCERIN  0.2 mg Transdermal Daily   rosuvastatin  10 mg Oral Daily   Continuous Infusions:  sodium chloride     cefTRIAXone (ROCEPHIN)  IV 2 g (09/22/20 2052)   heparin 1,000 Units/hr (09/23/20 0240)   PRN Meds: sodium chloride, albuterol, fentaNYL (SUBLIMAZE) injection, heparin, hydrALAZINE, ondansetron **OR** ondansetron (ZOFRAN) IV, senna-docusate   Vital Signs    Vitals:   09/22/20 1900 09/22/20 2200 09/23/20 0000 09/23/20 0428  BP: (!) 179/101 (!) 174/112 (!) 170/109 (!) 167/94  Pulse: 90 87 88 81  Resp: 20 20 18 20   Temp: 99.2 F (37.3 C)  98.8 F (37.1 C) 98.8 F (37.1 C)  TempSrc: Oral  Oral Oral  SpO2: 95% 97% 97% 98%  Weight:      Height:        Intake/Output Summary (Last 24 hours) at 09/23/2020 0716 Last data filed at 09/23/2020 9735 Gross per 24 hour  Intake 1326.26 ml  Output 1075 ml  Net 251.26 ml   Last 3 Weights 09/22/2020 09/22/2020 09/21/2020  Weight (lbs) 112 lb 3.4 oz 108 lb 0.4 oz 102 lb 15.3 oz  Weight (kg) 50.9 kg 49 kg 46.7 kg      Telemetry   SR, PVCs>>atrial tach (vs fib) w/ multiple episodes overnight- Personally Reviewed  ECG    None today -  Personally Reviewed  Physical Exam   GEN: acute distress    Neck: + JVD Cardiac: RRR, no murmur, no rubs, or gallops.  Respiratory: diminished to auscultation bilaterally with rales and wheezing. GI: Soft, nontender, non-distended  MS: LUE edema and very tender, decreased pulse,cap refill ok; No deformity. Neuro:  Nonfocal  Psych: upset  Labs    High Sensitivity Troponin:   Recent Labs  Lab 09/21/20 2021 09/21/20 2300  TROPONINIHS 120* 141*      Chemistry Recent Labs  Lab 09/21/20 2021 09/22/20 0447 09/22/20 1303 09/23/20 0400  NA 141 142 139 141  K 4.1 3.6 3.5 3.4*  CL 105 107 105 104  CO2 17* 16* 18* 24  GLUCOSE 101* 111* 121* 139*  BUN 114* 113* 113* 65*  CREATININE 14.20* 13.93* 13.98* 9.54*  CALCIUM 7.6* 7.4* 7.2* 8.2*  PROT 6.8  --   --   --   ALBUMIN 3.0*  --  2.5*  --   AST 32  --   --   --   ALT 27  --   --   --   ALKPHOS 114  --   --   --   BILITOT 0.8  --   --   --   GFRNONAA 3* 3* 3* 4*  ANIONGAP 19* 19* 16* 13     Hematology Recent Labs  Lab 09/22/20 0447 09/22/20 1250 09/22/20 2023 09/23/20 0400  WBC 7.0 6.4  --  7.6  RBC 2.33*  2.28* 1.97*  --  3.05*  HGB 6.6* 5.5* 8.5* 8.6*  HCT 21.0* 17.9* 25.3* 26.0*  MCV 90.1 90.9  --  85.2  MCH 28.3 27.9  --  28.2  MCHC 31.4 30.7  --  33.1  RDW 16.5* 16.4*  --  16.7*  PLT 372 330  --  275    BNP Recent Labs  Lab 09/21/20 2300 09/23/20 0400  BNP 3,659.7* 2,218.0*    Lab Results  Component Value Date   TSH 1.396 08/27/2019   Lab Results  Component Value Date   INR 1.1 09/21/2020   INR 1.4 (H) 05/13/2020   INR 1.6 (H) 04/29/2020     DDimer No results for input(s): DDIMER in the last 168 hours.   Radiology    IR Venocavagram Svc  Result Date: 09/22/2020 CLINICAL DATA:  Renal failure, progressive. Previously placed dialysis fistula could not be utilized for dialysis and a temporary hemodialysis catheter is requested. EXAM: EXAM LEFT JUGULAR VENOGRAM RIGHT IJ CATHETER PLACEMENT  UNDER  FLUOROSCOPIC GUIDANCE ULTRASOUND GUIDANCE FOR VASCULAR ACCESS X 2 TECHNIQUE: The procedure, risks (including but not limited to bleeding, infection, organ damage, pneumothorax), benefits, and alternatives were explained to the patient. Questions regarding the procedure were encouraged and answered. The patient understands and consents to the procedure. Initially, a left-sided approach was selected due to the available catheters. Patency of the left IJ vein was confirmed with ultrasound with image documentation. An appropriate skin site was determined. Skin site was marked. Region was prepped using maximum barrier technique including cap and mask, sterile gown, sterile gloves, large sterile sheet, and Chlorhexidine as cutaneous antisepsis. The region was infiltrated locally with 1% lidocaine. Under real-time ultrasound guidance, the vein was accessed with a 21-gauge micropuncture needle, exchanged over a 018 guidewire for a transitional dilator, through which a 035 guidewire was advanced. This would not pass centrally to the SVC. Venogram was performed. 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 Patency of the right external jugular vein was confirmed with ultrasound with image documentation. An appropriate skin site was determined. Skin site was marked. Region was prepped using maximum barrier technique including cap and mask, sterile gown, sterile gloves, large sterile sheet, and Chlorhexidine as cutaneous antisepsis. The region was infiltrated locally with 1% lidocaine. Under real-time ultrasound guidance, the vein was accessed with a 21-gauge micropuncture needle, exchanged over a 018 guidewire for a transitional dilator, through which a 035 guidewire was advanced, allowing passage of vascular dilator which allowed advancement of a 20 cm Trialysis catheter. This was positioned with the tip mid  right atrium. Spot chest radiograph shows good positioning and no pneumothorax. Catheter was flushed and sutured externally with 0-Prolene sutures. Patient tolerated the procedure well. FLUOROSCOPY TIME:  54 seconds; 86.5 mGy COMPLICATIONS: COMPLICATIONS none IMPRESSION: 1. 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 2. Technically successful right EJ Trialysis catheter placement. Electronically Signed   By: Lucrezia Europe M.D.   On: 09/22/2020 12:48   IR Fluoro Guide CV Line Right  Result Date: 09/22/2020 CLINICAL DATA:  Renal failure, progressive. Previously placed dialysis fistula could not be utilized for dialysis and a temporary hemodialysis catheter  is requested. EXAM: EXAM LEFT JUGULAR VENOGRAM RIGHT IJ CATHETER PLACEMENT UNDER  FLUOROSCOPIC GUIDANCE ULTRASOUND GUIDANCE FOR VASCULAR ACCESS X 2 TECHNIQUE: The procedure, risks (including but not limited to bleeding, infection, organ damage, pneumothorax), benefits, and alternatives were explained to the patient. Questions regarding the procedure were encouraged and answered. The patient understands and consents to the procedure. Initially, a left-sided approach was selected due to the available catheters. Patency of the left IJ vein was confirmed with ultrasound with image documentation. An appropriate skin site was determined. Skin site was marked. Region was prepped using maximum barrier technique including cap and mask, sterile gown, sterile gloves, large sterile sheet, and Chlorhexidine as cutaneous antisepsis. The region was infiltrated locally with 1% lidocaine. Under real-time ultrasound guidance, the vein was accessed with a 21-gauge micropuncture needle, exchanged over a 018 guidewire for a transitional dilator, through which a 035 guidewire was advanced. This would not pass centrally to the SVC. Venogram was performed. This demonstrated long segment occlusion of the  left innominate vein with immature thyrocervical collateral network to the right external jugular system. Patency of the right external jugular vein was confirmed with ultrasound with image documentation. An appropriate skin site was determined. Skin site was marked. Region was prepped using maximum barrier technique including cap and mask, sterile gown, sterile gloves, large sterile sheet, and Chlorhexidine as cutaneous antisepsis. The region was infiltrated locally with 1% lidocaine. Under real-time ultrasound guidance, the vein was accessed with a 21-gauge micropuncture needle, exchanged over a 018 guidewire for a transitional dilator, through which a 035 guidewire was advanced, allowing passage of vascular dilator which allowed advancement of a 20 cm Trialysis catheter. This was positioned with the tip mid right atrium. Spot chest radiograph shows good positioning and no pneumothorax. Catheter was flushed and sutured externally with 0-Prolene sutures. Patient tolerated the procedure well. FLUOROSCOPY TIME:  54 seconds; 33.3 mGy COMPLICATIONS: COMPLICATIONS none IMPRESSION: 1. 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 2. Technically successful right EJ Trialysis catheter placement. Electronically Signed   By: Lucrezia Europe M.D.   On: 09/22/2020 12:48   IR US Guide Vasc Access Left  Result Date: 09/22/2020 CLINICAL DATA:  Renal failure, progressive. Previously placed dialysis fistula could not be utilized for dialysis and a temporary hemodialysis catheter is requested. EXAM: EXAM LEFT JUGULAR VENOGRAM RIGHT IJ CATHETER PLACEMENT UNDER  FLUOROSCOPIC GUIDANCE ULTRASOUND GUIDANCE FOR VASCULAR ACCESS X 2 TECHNIQUE: The procedure, risks (including but not limited to bleeding, infection, organ damage, pneumothorax), benefits, and alternatives were explained to the patient. Questions regarding the procedure were encouraged and  answered. The patient understands and consents to the procedure. Initially, a left-sided approach was selected due to the available catheters. Patency of the left IJ vein was confirmed with ultrasound with image documentation. An appropriate skin site was determined. Skin site was marked. Region was prepped using maximum barrier technique including cap and mask, sterile gown, sterile gloves, large sterile sheet, and Chlorhexidine as cutaneous antisepsis. The region was infiltrated locally with 1% lidocaine. Under real-time ultrasound guidance, the vein was accessed with a 21-gauge micropuncture needle, exchanged over a 018 guidewire for a transitional dilator, through which a 035 guidewire was advanced. This would not pass centrally to the SVC. Venogram was performed. 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 Patency of the right external jugular vein was confirmed with  ultrasound with image documentation. An appropriate skin site was determined. Skin site was marked. Region was prepped using maximum barrier technique including cap and mask, sterile gown, sterile gloves, large sterile sheet, and Chlorhexidine as cutaneous antisepsis. The region was infiltrated locally with 1% lidocaine. Under real-time ultrasound guidance, the vein was accessed with a 21-gauge micropuncture needle, exchanged over a 018 guidewire for a transitional dilator, through which a 035 guidewire was advanced, allowing passage of vascular dilator which allowed advancement of a 20 cm Trialysis catheter. This was positioned with the tip mid right atrium. Spot chest radiograph shows good positioning and no pneumothorax. Catheter was flushed and sutured externally with 0-Prolene sutures. Patient tolerated the procedure well. FLUOROSCOPY TIME:  54 seconds; 94.4 mGy COMPLICATIONS: COMPLICATIONS none IMPRESSION: 1. Long segment occlusion of  the left innominate vein with mature thyrocervical collateral network draining into the right EJ vein. 2. Technically successful right EJ Trialysis catheter placement. Electronically Signed   By: Lucrezia Europe M.D.   On: 09/22/2020 12:48   IR US Guide Vasc Access Right  Result Date: 09/22/2020 CLINICAL DATA:  Renal failure, progressive. Previously placed dialysis fistula could not be utilized for dialysis and a temporary hemodialysis catheter is requested. EXAM: EXAM LEFT JUGULAR VENOGRAM RIGHT IJ CATHETER PLACEMENT UNDER  FLUOROSCOPIC GUIDANCE ULTRASOUND GUIDANCE FOR VASCULAR ACCESS X 2 TECHNIQUE: The procedure, risks (including but not limited to bleeding, infection, organ damage, pneumothorax), benefits, and alternatives were explained to the patient. Questions regarding the procedure were encouraged and answered. The patient understands and consents to the procedure. Initially, a left-sided approach was selected due to the available catheters. Patency of the left IJ vein was confirmed with ultrasound with image documentation. An appropriate skin site was determined. Skin site was marked. Region was prepped using maximum barrier technique including cap and mask, sterile gown, sterile gloves, large sterile sheet, and Chlorhexidine as cutaneous antisepsis. The region was infiltrated locally with 1% lidocaine. Under real-time ultrasound guidance, the vein was accessed with a 21-gauge micropuncture needle, exchanged over a 018 guidewire for a transitional dilator, through which a 035 guidewire was advanced. This would not pass centrally to the SVC. Venogram was performed. 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 Patency of the right external jugular vein was confirmed with ultrasound with image documentation. An appropriate skin site was determined. Skin site was marked. Region was prepped using maximum  barrier technique including cap and mask, sterile gown, sterile gloves, large sterile sheet, and Chlorhexidine as cutaneous antisepsis. The region was infiltrated locally with 1% lidocaine. Under real-time ultrasound guidance, the vein was accessed with a 21-gauge micropuncture needle, exchanged over a 018 guidewire for a transitional dilator, through which a 035 guidewire was advanced, allowing passage of vascular dilator which allowed advancement of a 20 cm Trialysis catheter. This was positioned with the tip mid right atrium. Spot chest radiograph shows good positioning and no pneumothorax. Catheter was flushed and sutured externally with 0-Prolene sutures. Patient tolerated the procedure well. FLUOROSCOPY TIME:  54 seconds; 96.7 mGy COMPLICATIONS: COMPLICATIONS none IMPRESSION: 1. 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 2. Technically successful right EJ Trialysis catheter placement. Electronically Signed   By: Lucrezia Europe M.D.   On: 09/22/2020 12:48   DG Chest Port 1 View  Result Date: 09/21/2020 CLINICAL DATA:  Shortness of breath, fever EXAM: PORTABLE CHEST 1 VIEW  COMPARISON:  Chest radiograph dated 11/30/2019 FINDINGS: The heart is enlarged. Mild-to-moderate bilateral lower lung predominant airspace opacities are noted. A small left pleural effusion may contribute. There is no right pleural effusion. There is no pneumothorax. Degenerative changes are seen in the spine and right shoulder. IMPRESSION: 1. Mild-to-moderate bilateral lower lung predominant airspace opacities likely represent pneumonia. A small left pleural effusion may contribute. 2. Cardiomegaly. Electronically Signed   By: Zerita Boers M.D.   On: 09/21/2020 20:53   ECHOCARDIOGRAM COMPLETE  Result Date: 09/22/2020    ECHOCARDIOGRAM REPORT   Patient Name:   SANA TESSMER Date of Exam: 09/22/2020 Medical Rec #:  [id number]          Height:        63.0 in Accession #:    [id number]         Weight:       108.0 lb Date of Birth:  Sep 28, 1950           BSA:          1.488 m Patient Age:    70 years           BP:           125/90 mmHg Patient Gender: F                  HR:           96 bpm. Exam Location:  Inpatient Procedure: 2D Echo, Cardiac Doppler and Color Doppler Indications:    R07.9* Chest pain, unspecified  History:        Patient has prior history of Echocardiogram examinations, most                 recent 08/28/2019. Stroke, Arrythmias:Atrial Fibrillation,                 Signs/Symptoms:Shortness of Breath and Chest Pain; Risk                 Factors:Hypertension.  Sonographer:    Bernadene Person RDCS Referring Phys: [id number] ROBIN [place]  1. Apical hypokinesis with overall normal LV function.  2. Left ventricular ejection fraction, by estimation, is 55 to 60%. The left ventricle has normal function. The left ventricle demonstrates regional wall motion abnormalities (see scoring diagram/findings for description). There is moderate left ventricular hypertrophy. Left ventricular diastolic parameters are consistent with Grade II diastolic dysfunction (pseudonormalization).  3. Right ventricular systolic function is normal. The right ventricular size is normal. There is mildly elevated pulmonary artery systolic pressure.  4. Left atrial size was severely dilated.  5. Right atrial size was mildly dilated.  6. The mitral valve is normal in structure. Mild mitral valve regurgitation. No evidence of mitral stenosis.  7. The aortic valve is tricuspid. Aortic valve regurgitation is not visualized. No aortic stenosis is present.  8. The inferior vena cava is normal in size with greater than 50% respiratory variability, suggesting right atrial pressure of 3 mmHg. FINDINGS  Left Ventricle: Left ventricular ejection fraction, by estimation, is 55 to 60%. The left ventricle has normal function. The left ventricle demonstrates regional wall motion  abnormalities. The left ventricular internal cavity size was normal in size. There is moderate left ventricular hypertrophy. Left ventricular diastolic parameters are consistent with Grade II diastolic dysfunction (pseudonormalization). Right Ventricle: The right ventricular size is normal. Right ventricular systolic function is normal. There is mildly elevated pulmonary artery systolic pressure. The tricuspid regurgitant velocity is 3.24 m/s, and with  an assumed right atrial pressure of 3 mmHg, the estimated right ventricular systolic pressure is 70.0 mmHg. Left Atrium: Left atrial size was severely dilated. Right Atrium: Right atrial size was mildly dilated. Pericardium: There is no evidence of pericardial effusion. Mitral Valve: The mitral valve is normal in structure. Mild mitral valve regurgitation. No evidence of mitral valve stenosis. Tricuspid Valve: The tricuspid valve is normal in structure. Tricuspid valve regurgitation is mild . No evidence of tricuspid stenosis. Aortic Valve: The aortic valve is tricuspid. Aortic valve regurgitation is not visualized. No aortic stenosis is present. Pulmonic Valve: The pulmonic valve was normal in structure. Pulmonic valve regurgitation is not visualized. No evidence of pulmonic stenosis. Aorta: The aortic root is normal in size and structure. Venous: The inferior vena cava is normal in size with greater than 50% respiratory variability, suggesting right atrial pressure of 3 mmHg. IAS/Shunts: The interatrial septum is aneurysmal. No atrial level shunt detected by color flow Doppler. Additional Comments: Apical hypokinesis with overall normal LV function.  LEFT VENTRICLE PLAX 2D LVIDd:         3.60 cm LVIDs:         2.80 cm LV PW:         1.50 cm LV IVS:        1.10 cm LVOT diam:     1.80 cm LV SV:         63 LV SV Index:   42 LVOT Area:     2.54 cm  LV Volumes (MOD) LV vol d, MOD A2C: 112.0 ml LV vol d, MOD A4C: 95.9 ml LV vol s, MOD A2C: 49.9 ml LV vol s, MOD A4C: 48.0  ml LV SV MOD A2C:     62.1 ml LV SV MOD A4C:     95.9 ml LV SV MOD BP:      53.4 ml RIGHT VENTRICLE TAPSE (M-mode): 1.7 cm LEFT ATRIUM             Index       RIGHT ATRIUM           Index LA diam:        3.20 cm 2.15 cm/m  RA Area:     18.10 cm LA Vol (A2C):   82.0 ml 55.10 ml/m RA Volume:   46.30 ml  31.11 ml/m LA Vol (A4C):   75.0 ml 50.39 ml/m LA Biplane Vol: 82.4 ml 55.37 ml/m  AORTIC VALVE LVOT Vmax:   149.50 cm/s LVOT Vmean:  100.150 cm/s LVOT VTI:    0.246 m  AORTA Ao Root diam: 2.60 cm Ao Asc diam:  2.80 cm MR PISA:        0.57 cm TRICUSPID VALVE MR PISA Radius: 0.30 cm  TR Peak grad:   42.0 mmHg                          TR Vmax:        324.00 cm/s                           SHUNTS                          Systemic VTI:  0.25 m                          Systemic Diam: 1.80 cm Aaron Edelman  Crenshaw MD Electronically signed by Kirk Ruths MD Signature Date/Time: 09/22/2020/11:02:07 AM    Final     Cardiac Studies   09/22/2020 Echo personallly reviewed    1. Apical hypokinesis with overall normal LV function.   2. Left ventricular ejection fraction, by estimation, is 55 to 60%. The  left ventricle has normal function. The left ventricle demonstrates  regional wall motion abnormalities (see scoring diagram/findings for  description). There is moderate left ventricular hypertrophy. Left ventricular diastolic parameters are consistent with Grade II diastolic dysfunction (pseudonormalization). Heavy trabeculations, Prominent pericardial fat pad  3. Right ventricular systolic function is normal. The right ventricular  size is normal. There is mildly elevated pulmonary artery systolic  pressure.   4. Left atrial size was severely dilated.   5. Right atrial size was mildly dilated.   6. The mitral valve is normal in structure. Mild mitral valve  regurgitation. No evidence of mitral stenosis.   7. The aortic valve is tricuspid. Aortic valve regurgitation is not  visualized. No aortic stenosis is  present.   8. The inferior vena cava is normal in size with greater than 50%  respiratory variability, suggesting right atrial pressure of 3 mmHg.    Assessment & Plan    70 yo woman with CKD/ESRD now, severe HTN, admitted with CP found to have severe anemia. Her kidney disease has progressed and she is now ESRD. Primary is awaiting a temp HD catheter to dialyze the patient and provide PRBC transfusion.  1. Acute hypoxic respiratory failure 2/2 pulmonary edema 2/2 ESRD and HTN - improved w/ HD - not able to use AVF, arm swollen and painful >> Nephrology aware and is calling [facility] to see - per IM/Nephrology  2. Chronic diastolic heart failure - volume mgt per HD  3. pAF on coumadin - Baseline SR, w/ episodes of ?atrial tach vs fib, all initiated by a PVC  4. H/o aortic dissection, type A s/p repair - follow   5. HTN - give amlodipine and metoprolol now - slight improvement w/ HD  6. Copd - per IM  7. Anemia of CKD - s/p transfusion per Nephrology   For questions or updates, please contact [place] Please consult [email or web address] for contact info under   Patient examined chart reviewed Really not much for cardiology to do as primary issue is renal failure, access issues and severe anemia. She is in NSR on telemetry  And has been transfused Troponin mildly elevated with no trend INR subRx on heparin Plan per primary service, VVS and renal  Jenkins Rouge MD [facility]       Signed, Rosaria Ferries, PA-C  09/23/2020, 7:16 AM

## 2020-09-23 NOTE — H&P (View-Only) (Signed)
[facility] CONSULT NOTE   MRN : [id number]  Reason for Consult: Left UE edema with pain s/p Left arm brachial artery to cephalic vein AV fistula creation 04/20/19.  AKI on CKD. Referring Physician: Dr. Carolin Sicks.  History of Present Illness: 70 y/o female with ESRD developed left UE edema with pain. She required dialysis after TAA surgery for few month in 2020 until some renal recovery with CKD stage V now admitted with respiratory failure, volume overload. 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  A temp HD cath was placed by IR at the same time.  She denise previous access on the right UE.     Past medical history includes: chronic diastolic CHF, paroxysmal atrial fibrillation on warfarin, thoracic aortic aneurysm repair in 2020, hypertension, and history of CVA.         Current Facility-Administered Medications  Medication Dose Route Frequency Provider Last Rate Last Admin  . 0.9 %  sodium chloride infusion (Manually program via Guardrails IV Fluids)   Intravenous Once Shalhoub, Sherryll Burger, MD      . 0.9 %  sodium chloride infusion  250 mL Intravenous PRN Opyd, Ilene Qua, MD      . albuterol (VENTOLIN HFA) 108 (90 Base) MCG/ACT inhaler 2 puff  2 puff Inhalation Q6H PRN Opyd, Ilene Qua, MD      . amLODipine (NORVASC) tablet 10 mg  10 mg Oral Daily Opyd, Ilene Qua, MD   10 mg at 09/23/20 0733  . calcium acetate (PHOSLO) capsule 667 mg  667 mg Oral TID WC Rosita Fire, MD      . cefTRIAXone (ROCEPHIN) 2 g in sodium chloride 0.9 % 100 mL IVPB  2 g Intravenous Q24H Opyd, Ilene Qua, MD 200 mL/hr at 09/22/20 2052 2 g at 09/22/20 2052  . Chlorhexidine Gluconate Cloth 2 % PADS 6 each  6 each Topical Q0600 Roney Jaffe, MD   6 each at 09/23/20 0025  . Chlorhexidine Gluconate Cloth 2 % PADS 6 each  6 each Topical Q0600 Rosita Fire, MD      . Darbepoetin Alfa (ARANESP) injection 100 mcg  100 mcg Intravenous Q Mon-HD Rosita Fire, MD      . dextromethorphan-guaiFENesin [employer] DM) 30-600 MG per 12 hr tablet 1 tablet  1 tablet Oral BID Barb Merino, MD   1 tablet at 09/23/20 0903  . doxycycline (VIBRA-TABS) tablet 100 mg  100 mg Oral Q12H Guilford Shi, MD   100 mg at 09/23/20 0903  . fentaNYL (SUBLIMAZE) injection 25 mcg  25 mcg Intravenous Q2H PRN Vianne Bulls, MD   25 mcg at 09/23/20 0734  . ferric gluconate (NULECIT) 250 mg in sodium chloride 0.9 % 100 mL IVPB  250 mg Intravenous Daily Rosita Fire, MD      . furosemide (LASIX) injection 80 mg  80 mg Intravenous Q12H Opyd, Ilene Qua, MD   80 mg at 09/23/20 0903  . heparin ADULT infusion 100 units/mL (25000 units/248mL)  1,000 Units/hr Intravenous Continuous Erenest Blank, RPH 10 mL/hr at 09/23/20 0638 1,000 Units/hr at 09/23/20 1941  . heparin injection 2,000 Units  2,000 Units Dialysis PRN Roney Jaffe, MD   2,000 Units at 09/22/20 1459  . hydrALAZINE (APRESOLINE) tablet 25 mg  25 mg Oral Q6H PRN Opyd, Ilene Qua, MD   25 mg at 09/22/20 1512  .  hydrALAZINE (APRESOLINE) tablet 25 mg  25 mg Oral BID Vianne Bulls, MD   25 mg at 09/23/20 0903  . metoprolol succinate (TOPROL-XL) 24 hr tablet 75 mg  75 mg Oral Daily Guilford Shi, MD   75 mg at 09/23/20 0734  . multivitamin (RENA-VIT) tablet 1 tablet  1 tablet Oral QHS Roney Jaffe, MD   1 tablet at 09/22/20 2050  . nitroGLYCERIN (NITRODUR - Dosed in mg/24 hr) patch 0.2 mg  0.2 mg Transdermal Daily Guilford Shi, MD   0.2 mg at 09/23/20 0913  . ondansetron (ZOFRAN) tablet 4 mg  4 mg Oral Q6H PRN Opyd, Ilene Qua, MD       Or  . ondansetron (ZOFRAN) injection 4 mg  4 mg Intravenous Q6H PRN Opyd, Ilene Qua, MD   4 mg at 09/23/20 1024  . rosuvastatin (CRESTOR) tablet 10 mg  10 mg Oral Daily Opyd, Ilene Qua, MD   10 mg at 09/23/20 0903  . senna-docusate (Senokot-S) tablet 1 tablet   1 tablet Oral QHS PRN Opyd, Ilene Qua, MD        Pt meds include: Statin :Yes Betablocker: Yes ASA: No Other anticoagulants/antiplatelets: Coumadin on hold, Heparin running  Past Medical History:  Diagnosis Date  . AF (paroxysmal atrial fibrillation) ([place]) 05/29/2019  . Aortic atherosclerosis ([place]) 07/05/2019  . Aortic dissection ([place]) 04/04/2019  . Atelectasis 2002   Bilateral  . Atrial fibrillation with RVR ([place]) 05/18/2019  . Atypical chest pain 07/05/2019  . Bone spur 2008   Right calcaneal foot spur  . Breast cancer ([place]) 2004   Ductal carcinoma in situ of the left breast; S/P left partial mastectomy 02/26/2003; S/P re-excision of cranial and lateral margins11/18/2004.radiation  . Breast cancer ([place]) 09/21/2012   right breast/ last radiation treatment 03/22/2013  . Cerebral thrombosis with cerebral infarction 05/22/2019  . Chronic kidney disease, stage IV (severe) ([place]) 10/10/2007  . Chronic low back pain 06/22/2016  . Chronic obstructive lung disease ([place]) 01/16/2017  . CKD (chronic kidney disease), stage IV ([place]) 07/19/2017  . 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 Autumn Messing; S/P re-excision of superior and inferior margins 10/27/2012.   . Dissection of aorta ([place]) 04/03/2019  . Elevated troponin 08/28/2019  . ESRD on dialysis ([place]) 05/29/2019  . Essential hypertension 09/16/2006  . GERD 09/16/2006  . GERD (gastroesophageal reflux disease)   . H/O: stroke 05/29/2019  . 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 Hot flashes   . Hx of radiation therapy 2005   left breast  . Hx of radiation therapy 01/11/13- 03/22/13   right breast 5760 cGy 30 sessions  . Hyperkalemia 08/28/2019  . Hypertension   . Insomnia 03/14/2015  . Low back pain   . Lumbar spinal stenosis    S/P lumbar decompressive laminectomy, fusion and plating for lumbar spinal stensosis  . Malnutrition of moderate degree 05/19/2019  .  Non compliance with medical treatment 12/04/2017  . Normocytic anemia    With thrombocytosis  . On Coumadin for atrial fibrillation ([place]) 10/03/2019  . Osteoarthritis   . Paroxysmal SVT (supraventricular tachycardia) ([place]) 08/27/2019  . Personal history of radiation therapy   . Pleural effusion 05/18/2019  . Positive D dimer 08/28/2019  . Right ureteral stone 2002  . S/P aortic aneurysm repair 04/07/2019  . S/P lumbar spinal fusion 01/18/2014   S/P lumbar decompressive laminectomy, fusion, and plating for lumbar  spinal stenosis on 05/27/2009 by Dr. Eustace Moore.  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 Eustace Moore.    . Shortness of breath    from pain  . SIRS (systemic inflammatory response syndrome) ([place]) 08/27/2019  . SVT (supraventricular tachycardia) ([place]) 08/28/2019  . Tobacco use disorder 04/19/2009  . Uterine fibroid   . Wears dentures    top    Past Surgical History:  Procedure Laterality Date  . ANTERIOR LAT LUMBAR FUSION N/A 01/18/2014   Procedure: ANTERIOR LATERAL LUMBAR FUSION LUMBAR TWO-THREE;  Surgeon: Eustace Moore, MD;  Location: [facility];  Service: Neurosurgery;  Laterality: N/A;  . ANTERIOR LUMBAR FUSION  01/18/2014  . AV FISTULA PLACEMENT Left 04/20/2019   Procedure: ARTERIOVENOUS (AV) FISTULA CREATION;  Surgeon: [name], MD;  Location: [facility];  Service: Vascular;  Laterality: Left;  . BACK SURGERY    . BREAST LUMPECTOMY Left 01/2003  . BREAST LUMPECTOMY Right 2014  . BREAST LUMPECTOMY WITH NEEDLE LOCALIZATION AND AXILLARY SENTINEL LYMPH NODE BX Right 10/13/2012   Procedure: BREAST LUMPECTOMY WITH NEEDLE LOCALIZATION;  Surgeon: [name], MD;  Location: [facility];  Service: General;  Laterality: Right;  Right breast wire localized  lumpectomy  . INSERTION OF DIALYSIS CATHETER Right 04/20/2019   Procedure: INSERTION OF DIALYSIS CATHETER, right internal jugular;  Surgeon: [name], MD;  Location: [facility];  Service: Vascular;  Laterality: Right;  . IR FLUORO GUIDE CV LINE RIGHT  09/22/2020  . IR THORACENTESIS ASP PLEURAL SPACE W/IMG GUIDE  05/19/2019  . IR US GUIDE VASC ACCESS LEFT  09/22/2020  . IR US GUIDE VASC ACCESS RIGHT  09/22/2020  . IR VENOCAVAGRAM SVC  09/22/2020  . LAMINECTOMY  05/27/2009   Lumbar decompressive laminectomy, fusion and plating for lumbar spinal stensosis  . LUMBAR LAMINECTOMY/DECOMPRESSION MICRODISCECTOMY Left 03/23/2013   Procedure: LUMBAR LAMINECTOMY/DECOMPRESSION MICRODISCECTOMY 1 LEVEL;  Surgeon: Eustace Moore, MD;  Location: [facility];  Service: Neurosurgery;  Laterality: Left;  LUMBAR LAMINECTOMY/DECOMPRESSION MICRODISCECTOMY 1 LEVEL  . MASTECTOMY, PARTIAL Left 02/26/2003   ; S/P re-excision of cranial and lateral margins 04/19/2003.   Marland Kitchen RE-EXCISION OF BREAST CANCER,SUPERIOR MARGINS Right 10/27/2012   Procedure: RE-EXCISION OF BREAST CANCER,SUPERIOR and inferior MARGINS;  Surgeon: [name], MD;  Location: [facility];  Service: General;  Laterality: Right;  . RE-EXCISION OF BREAST LUMPECTOMY Left 04/2003  . TEE WITHOUT CARDIOVERSION N/A 04/04/2019   Procedure: Transesophageal Echocardiogram (Tee);  Surgeon: Wonda Olds, MD;  Location: [facility];  Service: Open Heart Surgery;  Laterality: N/A;  . THORACIC AORTIC ANEURYSM REPAIR N/A 04/04/2019   Procedure: THORACIC ASCENDING ANEURYSM REPAIR (AAA)  USING 28 MM X 30 CM HEMASHIELD PLATINUM VASCULAR GRAFT;  Surgeon: Wonda Olds, MD;  Location[facility];  Service: Open Heart Surgery;  Laterality: N/A;    Social History Social History   Tobacco Use  . Smoking status: Former Smoker    Packs/day: 0.25    Years: 44.00    Pack years: 11.00    Types: Cigarettes    Quit date: 05/24/2019    Years since quitting: 1.3  . Smokeless  tobacco: Never Used  . Tobacco comment: smoking less  Vaping Use  . Vaping Use: Never used  Substance Use Topics  . Alcohol use: Not Currently  Alcohol/week: 2.0 standard drinks    Types: 2 Cans of beer per week    Comment:  "couple beers a weekend"  . Drug use: Not Currently    Types: Cocaine    Comment: "stopped back in the 1980's"    Family History Family History  Problem Relation Age of Onset  . Colon cancer Mother 21  . Hypertension Mother   . Diabetes Sister 72  . Hypertension Sister   . Diabetes Brother   . Hypertension Brother   . Diabetes Brother   . Hypertension Brother   . Kidney disease Son        s/p renal transplant  . Hypertension Son   . Diabetes Son   . Multiple sclerosis Son   . Bone cancer Sister 3  . Breast cancer Neg Hx   . Cervical cancer Neg Hx     Allergies  Allergen Reactions  . Shrimp [Shellfish Allergy] Shortness Of Breath  . Bactroban [Mupirocin] Other (See Comments)    "Sores in nose"  . Vicodin [Hydrocodone-Acetaminophen] Itching and Nausea And Vomiting    This is patient's home medication  . Eliquis [Apixaban] Rash  . Lisinopril Cough  . Tylenol [Acetaminophen] Itching     REVIEW OF SYSTEMS  General: [ ]  Weight loss, [ ]  Fever, [ ]  chills Neurologic: [ ]  Dizziness, [ ]  Blackouts, [ ]  Seizure [ ]  Stroke, [ ]  "Mini stroke", [ ]  Slurred speech, [ ]  Temporary blindness; [ ]  weakness in arms or legs, [ ]  Hoarseness [ ]  Dysphagia Cardiac: [ ]  Chest pain/pressure, [ ]  Shortness of breath at rest [ ]  Shortness of breath with exertion, [ ]  Atrial fibrillation or irregular heartbeat  Vascular: [ ]  Pain in legs with walking, [ ]  Pain in legs at rest, [ ]  Pain in legs at night,  [ ]  Non-healing ulcer, [ ]  Blood clot in vein/DVT,   Pulmonary: [ ]  Home oxygen, [ ]  Productive cough, [ ]  Coughing up blood, [ ]  Asthma,  [ ]  Wheezing [ ]  COPD Musculoskeletal:  [ ]  Arthritis, [ ]  Low back pain, [ ]  Joint pain Hematologic: [ ]  Easy Bruising, [  ] Anemia; [ ]  Hepatitis Gastrointestinal: [ ]  Blood in stool, [ ]  Gastroesophageal Reflux/heartburn, Urinary: [ ]  chronic Kidney disease, [x ] on HD - [ ]  MWF or [ ]  TTHS, [ ]  Burning with urination, [ ]  Difficulty urinating Skin: [ ]  Rashes, [ ]  Wounds Psychological: [ ]  Anxiety, [ ]  Depression  Physical Examination Vitals:   09/22/20 2200 09/23/20 0000 09/23/20 0428 09/23/20 0719  BP: (!) 174/112 (!) 170/109 (!) 167/94 (!) 180/127  Pulse: 87 88 81 86  Resp: 20 18 20 19   Temp:  98.8 F (37.1 C) 98.8 F (37.1 C)   TempSrc:  Oral Oral   SpO2: 97% 97% 98% 95%  Weight:      Height:       Body mass index is 19.88 kg/m.  General:  WDWN in NAD Gait: Normal HENT: WNL Eyes: Pupils equal Pulmonary: normal non-labored breathing , without Rales, rhonchi,  wheezing Cardiac: RRR, without  Murmurs, rubs or gallops; No carotid bruits Abdomen: soft, NT, no masses Skin: no rashes, ulcers noted;  no Gangrene , no cellulitis; no open wounds;      Musculoskeletal: no muscle wasting or atrophy; positive left UE edema.  Skin warm to touch with tenderness to palpation.  Skin warm with intact motor. Neurologic: A&O X 3; Appropriate Affect ;  SENSATION: normal;  MOTOR FUNCTION:  Intact with decreased left UE due to edema and pain Speech is fluent/normal   Significant Diagnostic Studies: CBC Lab Results  Component Value Date   WBC 7.6 09/23/2020   HGB 8.6 (L) 09/23/2020   HCT 26.0 (L) 09/23/2020   MCV 85.2 09/23/2020   PLT 275 09/23/2020    BMET    Component Value Date/Time   NA 141 09/23/2020 0400   NA 140 05/13/2020 1108   NA 142 09/28/2012 0831   K 3.4 (L) 09/23/2020 0400   K 4.2 09/28/2012 0831   CL 104 09/23/2020 0400   CL 103 09/28/2012 0831   CO2 24 09/23/2020 0400   CO2 30 (H) 09/28/2012 0831   GLUCOSE 139 (H) 09/23/2020 0400   GLUCOSE 103 (H) 09/28/2012 0831   BUN 65 (H) 09/23/2020 0400   BUN 47 (H) 05/13/2020 1108   BUN 18.3 09/28/2012 0831   CREATININE 9.54 (H)  09/23/2020 0400   CREATININE 2.09 (H) 01/18/2017 1145   CREATININE 1.2 (H) 09/28/2012 0831   CALCIUM 8.2 (L) 09/23/2020 0400   CALCIUM 9.4 09/28/2012 0831   GFRNONAA 4 (L) 09/23/2020 0400   GFRNONAA 23 (L) 12/09/2016 1458   GFRAA 8 (L) 05/13/2020 1108   GFRAA 26 (L) 12/09/2016 1458   Estimated Creatinine Clearance: 4.5 mL/min (A) (by C-G formula based on SCr of 9.54 mg/dL (H)).  COAG Lab Results  Component Value Date   INR 1.1 09/21/2020   INR 1.4 (H) 05/13/2020   INR 1.6 (H) 04/29/2020     Non-Invasive Vascular Imaging:  Venogram IMPRESSION: 1. 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 2. Technically successful right EJ Trialysis catheter placement.  ASSESSMENT/PLAN:  AKI on CKD now requiring HD via Temp cath placed by IR. She has a previous left AV fistula placed in 2020 by Dr. Donzetta Matters.  The left innominate vein is occluded.    She will be taken to the OR tomorrow for left AV fistula ligation and TDC exchange.  She has not had vein mapping in over 2 years.  We will repeat the vein mapping of the right UE and plan new access in the future.    NPO past MN   Roxy Henderson 09/23/2020 11:14 AM   History and exam details as above.  Patient has venous hypertension in her left upper extremity with a pre-existing left arm AV fistula.  She has a long segment occlusion of the left innominate vein.  She will need ligation of her left arm AV fistula and placement of a tunneled dialysis catheter.  We will plan for a new access after she has healed up some from this procedure.  We will hold her heparin at midnight.  N.p.o. after midnight  Consent  Procedure risks benefits details were discussed with the patient today and she understands and agrees to proceed.  Ruta Hinds, MD [facility] Office: [phone]

## 2020-09-23 NOTE — Consult Note (Addendum)
[facility] CONSULT NOTE   MRN : [id number]  Reason for Consult: Left UE edema with pain s/p Left arm brachial artery to cephalic vein AV fistula creation 04/20/19.  AKI on CKD. Referring Physician: Dr. Carolin Sicks.  History of Present Illness: 70 y/o female with ESRD developed left UE edema with pain. She required dialysis after TAA surgery for few month in 2020 until some renal recovery with CKD stage V now admitted with respiratory failure, volume overload. 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  A temp HD cath was placed by IR at the same time.  She denise previous access on the right UE.     Past medical history includes: chronic diastolic CHF, paroxysmal atrial fibrillation on warfarin, thoracic aortic aneurysm repair in 2020, hypertension, and history of CVA.         Current Facility-Administered Medications  Medication Dose Route Frequency Provider Last Rate Last Admin  . 0.9 %  sodium chloride infusion (Manually program via Guardrails IV Fluids)   Intravenous Once Shalhoub, Sherryll Burger, MD      . 0.9 %  sodium chloride infusion  250 mL Intravenous PRN Opyd, Ilene Qua, MD      . albuterol (VENTOLIN HFA) 108 (90 Base) MCG/ACT inhaler 2 puff  2 puff Inhalation Q6H PRN Opyd, Ilene Qua, MD      . amLODipine (NORVASC) tablet 10 mg  10 mg Oral Daily Opyd, Ilene Qua, MD   10 mg at 09/23/20 0733  . calcium acetate (PHOSLO) capsule 667 mg  667 mg Oral TID WC Rosita Fire, MD      . cefTRIAXone (ROCEPHIN) 2 g in sodium chloride 0.9 % 100 mL IVPB  2 g Intravenous Q24H Opyd, Ilene Qua, MD 200 mL/hr at 09/22/20 2052 2 g at 09/22/20 2052  . Chlorhexidine Gluconate Cloth 2 % PADS 6 each  6 each Topical Q0600 Roney Jaffe, MD   6 each at 09/23/20 0025  . Chlorhexidine Gluconate Cloth 2 % PADS 6 each  6 each Topical Q0600 Rosita Fire, MD      . Darbepoetin Alfa (ARANESP) injection 100 mcg  100 mcg Intravenous Q Mon-HD Rosita Fire, MD      . dextromethorphan-guaiFENesin [facility] DM) 30-600 MG per 12 hr tablet 1 tablet  1 tablet Oral BID Barb Merino, MD   1 tablet at 09/23/20 0903  . doxycycline (VIBRA-TABS) tablet 100 mg  100 mg Oral Q12H Guilford Shi, MD   100 mg at 09/23/20 0903  . fentaNYL (SUBLIMAZE) injection 25 mcg  25 mcg Intravenous Q2H PRN Vianne Bulls, MD   25 mcg at 09/23/20 0734  . ferric gluconate (NULECIT) 250 mg in sodium chloride 0.9 % 100 mL IVPB  250 mg Intravenous Daily Rosita Fire, MD      . furosemide (LASIX) injection 80 mg  80 mg Intravenous Q12H Opyd, Ilene Qua, MD   80 mg at 09/23/20 0903  . heparin ADULT infusion 100 units/mL (25000 units/25mL)  1,000 Units/hr Intravenous Continuous Erenest Blank, RPH 10 mL/hr at 09/23/20 0638 1,000 Units/hr at 09/23/20 4709  . heparin injection 2,000 Units  2,000 Units Dialysis PRN Roney Jaffe, MD   2,000 Units at 09/22/20 1459  . hydrALAZINE (APRESOLINE) tablet 25 mg  25 mg Oral Q6H PRN Opyd, Ilene Qua, MD   25 mg at 09/22/20 1512  .  hydrALAZINE (APRESOLINE) tablet 25 mg  25 mg Oral BID Vianne Bulls, MD   25 mg at 09/23/20 0903  . metoprolol succinate (TOPROL-XL) 24 hr tablet 75 mg  75 mg Oral Daily Guilford Shi, MD   75 mg at 09/23/20 0734  . multivitamin (RENA-VIT) tablet 1 tablet  1 tablet Oral QHS Roney Jaffe, MD   1 tablet at 09/22/20 2050  . nitroGLYCERIN (NITRODUR - Dosed in mg/24 hr) patch 0.2 mg  0.2 mg Transdermal Daily Guilford Shi, MD   0.2 mg at 09/23/20 0913  . ondansetron (ZOFRAN) tablet 4 mg  4 mg Oral Q6H PRN Opyd, Ilene Qua, MD       Or  . ondansetron (ZOFRAN) injection 4 mg  4 mg Intravenous Q6H PRN Opyd, Ilene Qua, MD   4 mg at 09/23/20 1024  . rosuvastatin (CRESTOR) tablet 10 mg  10 mg Oral Daily Opyd, Ilene Qua, MD   10 mg at 09/23/20 0903  . senna-docusate (Senokot-S) tablet 1 tablet   1 tablet Oral QHS PRN Opyd, Ilene Qua, MD        Pt meds include: Statin :Yes Betablocker: Yes ASA: No Other anticoagulants/antiplatelets: Coumadin on hold, Heparin running  Past Medical History:  Diagnosis Date  . AF (paroxysmal atrial fibrillation) ([place]) 05/29/2019  . Aortic atherosclerosis ([place]) 07/05/2019  . Aortic dissection ([place]) 04/04/2019  . Atelectasis 2002   Bilateral  . Atrial fibrillation with RVR ([place]) 05/18/2019  . Atypical chest pain 07/05/2019  . Bone spur 2008   Right calcaneal foot spur  . Breast cancer ([place]) 2004   Ductal carcinoma in situ of the left breast; S/P left partial mastectomy 02/26/2003; S/P re-excision of cranial and lateral margins11/18/2004.radiation  . Breast cancer ([place]) 09/21/2012   right breast/ last radiation treatment 03/22/2013  . Cerebral thrombosis with cerebral infarction 05/22/2019  . Chronic kidney disease, stage IV (severe) ([place]) 10/10/2007  . Chronic low back pain 06/22/2016  . Chronic obstructive lung disease ([place]) 01/16/2017  . CKD (chronic kidney disease), stage IV ([place]) 07/19/2017  . 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 Autumn Messing; S/P re-excision of superior and inferior margins 10/27/2012.   . Dissection of aorta ([place]) 04/03/2019  . Elevated troponin 08/28/2019  . ESRD on dialysis ([place]) 05/29/2019  . Essential hypertension 09/16/2006  . GERD 09/16/2006  . GERD (gastroesophageal reflux disease)   . H/O: stroke 05/29/2019  . 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 Hot flashes   . Hx of radiation therapy 2005   left breast  . Hx of radiation therapy 01/11/13- 03/22/13   right breast 5760 cGy 30 sessions  . Hyperkalemia 08/28/2019  . Hypertension   . Insomnia 03/14/2015  . Low back pain   . Lumbar spinal stenosis    S/P lumbar decompressive laminectomy, fusion and plating for lumbar spinal stensosis  . Malnutrition of moderate degree 05/19/2019  .  Non compliance with medical treatment 12/04/2017  . Normocytic anemia    With thrombocytosis  . On Coumadin for atrial fibrillation ([place]) 10/03/2019  . Osteoarthritis   . Paroxysmal SVT (supraventricular tachycardia) ([place]) 08/27/2019  . Personal history of radiation therapy   . Pleural effusion 05/18/2019  . Positive D dimer 08/28/2019  . Right ureteral stone 2002  . S/P aortic aneurysm repair 04/07/2019  . S/P lumbar spinal fusion 01/18/2014   S/P lumbar decompressive laminectomy, fusion, and plating for lumbar  spinal stenosis on 05/27/2009 by Dr. Eustace Moore.  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 Eustace Moore.    . Shortness of breath    from pain  . SIRS (systemic inflammatory response syndrome) ([place]) 08/27/2019  . SVT (supraventricular tachycardia) ([place]) 08/28/2019  . Tobacco use disorder 04/19/2009  . Uterine fibroid   . Wears dentures    top    Past Surgical History:  Procedure Laterality Date  . ANTERIOR LAT LUMBAR FUSION N/A 01/18/2014   Procedure: ANTERIOR LATERAL LUMBAR FUSION LUMBAR TWO-THREE;  Surgeon: Eustace Moore, MD;  Location: [facility];  Service: Neurosurgery;  Laterality: N/A;  . ANTERIOR LUMBAR FUSION  01/18/2014  . AV FISTULA PLACEMENT Left 04/20/2019   Procedure: ARTERIOVENOUS (AV) FISTULA CREATION;  Surgeon: [name], MD;  Location: [facility];  Service: Vascular;  Laterality: Left;  . BACK SURGERY    . BREAST LUMPECTOMY Left 01/2003  . BREAST LUMPECTOMY Right 2014  . BREAST LUMPECTOMY WITH NEEDLE LOCALIZATION AND AXILLARY SENTINEL LYMPH NODE BX Right 10/13/2012   Procedure: BREAST LUMPECTOMY WITH NEEDLE LOCALIZATION;  Surgeon: [name], MD;  Location: [facility];  Service: General;  Laterality: Right;  Right breast wire localized  lumpectomy  . INSERTION OF DIALYSIS CATHETER Right 04/20/2019   Procedure: INSERTION OF DIALYSIS CATHETER, right internal jugular;  Surgeon: [name], MD;  Location: [facility];  Service: Vascular;  Laterality: Right;  . IR FLUORO GUIDE CV LINE RIGHT  09/22/2020  . IR THORACENTESIS ASP PLEURAL SPACE W/IMG GUIDE  05/19/2019  . IR US GUIDE VASC ACCESS LEFT  09/22/2020  . IR US GUIDE VASC ACCESS RIGHT  09/22/2020  . IR VENOCAVAGRAM SVC  09/22/2020  . LAMINECTOMY  05/27/2009   Lumbar decompressive laminectomy, fusion and plating for lumbar spinal stensosis  . LUMBAR LAMINECTOMY/DECOMPRESSION MICRODISCECTOMY Left 03/23/2013   Procedure: LUMBAR LAMINECTOMY/DECOMPRESSION MICRODISCECTOMY 1 LEVEL;  Surgeon: Eustace Moore, MD;  Location: [facility];  Service: Neurosurgery;  Laterality: Left;  LUMBAR LAMINECTOMY/DECOMPRESSION MICRODISCECTOMY 1 LEVEL  . MASTECTOMY, PARTIAL Left 02/26/2003   ; S/P re-excision of cranial and lateral margins 04/19/2003.   Marland Kitchen RE-EXCISION OF BREAST CANCER,SUPERIOR MARGINS Right 10/27/2012   Procedure: RE-EXCISION OF BREAST CANCER,SUPERIOR and inferior MARGINS;  Surgeon: [name], MD;  Location: [facility];  Service: General;  Laterality: Right;  . RE-EXCISION OF BREAST LUMPECTOMY Left 04/2003  . TEE WITHOUT CARDIOVERSION N/A 04/04/2019   Procedure: Transesophageal Echocardiogram (Tee);  Surgeon: Wonda Olds, MD;  Location: [facility];  Service: Open Heart Surgery;  Laterality: N/A;  . THORACIC AORTIC ANEURYSM REPAIR N/A 04/04/2019   Procedure: THORACIC ASCENDING ANEURYSM REPAIR (AAA)  USING 28 MM X 30 CM HEMASHIELD PLATINUM VASCULAR GRAFT;  Surgeon: Wonda Olds, MD;  Location: [facility];  Service: Open Heart Surgery;  Laterality: N/A;    Social History Social History   Tobacco Use  . Smoking status: Former Smoker    Packs/day: 0.25    Years: 44.00    Pack years: 11.00    Types: Cigarettes    Quit date: 05/24/2019    Years since quitting: 1.3  . Smokeless  tobacco: Never Used  . Tobacco comment: smoking less  Vaping Use  . Vaping Use: Never used  Substance Use Topics  . Alcohol use: Not Currently  Alcohol/week: 2.0 standard drinks    Types: 2 Cans of beer per week    Comment:  "couple beers a weekend"  . Drug use: Not Currently    Types: Cocaine    Comment: "stopped back in the 1980's"    Family History Family History  Problem Relation Age of Onset  . Colon cancer Mother 31  . Hypertension Mother   . Diabetes Sister 59  . Hypertension Sister   . Diabetes Brother   . Hypertension Brother   . Diabetes Brother   . Hypertension Brother   . Kidney disease Son        s/p renal transplant  . Hypertension Son   . Diabetes Son   . Multiple sclerosis Son   . Bone cancer Sister 8  . Breast cancer Neg Hx   . Cervical cancer Neg Hx     Allergies  Allergen Reactions  . Shrimp [Shellfish Allergy] Shortness Of Breath  . Bactroban [Mupirocin] Other (See Comments)    "Sores in nose"  . Vicodin [Hydrocodone-Acetaminophen] Itching and Nausea And Vomiting    This is patient's home medication  . Eliquis [Apixaban] Rash  . Lisinopril Cough  . Tylenol [Acetaminophen] Itching     REVIEW OF SYSTEMS  General: [ ]  Weight loss, [ ]  Fever, [ ]  chills Neurologic: [ ]  Dizziness, [ ]  Blackouts, [ ]  Seizure [ ]  Stroke, [ ]  "Mini stroke", [ ]  Slurred speech, [ ]  Temporary blindness; [ ]  weakness in arms or legs, [ ]  Hoarseness [ ]  Dysphagia Cardiac: [ ]  Chest pain/pressure, [ ]  Shortness of breath at rest [ ]  Shortness of breath with exertion, [ ]  Atrial fibrillation or irregular heartbeat  Vascular: [ ]  Pain in legs with walking, [ ]  Pain in legs at rest, [ ]  Pain in legs at night,  [ ]  Non-healing ulcer, [ ]  Blood clot in vein/DVT,   Pulmonary: [ ]  Home oxygen, [ ]  Productive cough, [ ]  Coughing up blood, [ ]  Asthma,  [ ]  Wheezing [ ]  COPD Musculoskeletal:  [ ]  Arthritis, [ ]  Low back pain, [ ]  Joint pain Hematologic: [ ]  Easy Bruising, [  ] Anemia; [ ]  Hepatitis Gastrointestinal: [ ]  Blood in stool, [ ]  Gastroesophageal Reflux/heartburn, Urinary: [ ]  chronic Kidney disease, [x ] on HD - [ ]  MWF or [ ]  TTHS, [ ]  Burning with urination, [ ]  Difficulty urinating Skin: [ ]  Rashes, [ ]  Wounds Psychological: [ ]  Anxiety, [ ]  Depression  Physical Examination Vitals:   09/22/20 2200 09/23/20 0000 09/23/20 0428 09/23/20 0719  BP: (!) 174/112 (!) 170/109 (!) 167/94 (!) 180/127  Pulse: 87 88 81 86  Resp: 20 18 20 19   Temp:  98.8 F (37.1 C) 98.8 F (37.1 C)   TempSrc:  Oral Oral   SpO2: 97% 97% 98% 95%  Weight:      Height:       Body mass index is 19.88 kg/m.  General:  WDWN in NAD Gait: Normal HENT: WNL Eyes: Pupils equal Pulmonary: normal non-labored breathing , without Rales, rhonchi,  wheezing Cardiac: RRR, without  Murmurs, rubs or gallops; No carotid bruits Abdomen: soft, NT, no masses Skin: no rashes, ulcers noted;  no Gangrene , no cellulitis; no open wounds;      Musculoskeletal: no muscle wasting or atrophy; positive left UE edema.  Skin warm to touch with tenderness to palpation.  Skin warm with intact motor. Neurologic: A&O X 3; Appropriate Affect ;  SENSATION: normal;  MOTOR FUNCTION:  Intact with decreased left UE due to edema and pain Speech is fluent/normal   Significant Diagnostic Studies: CBC Lab Results  Component Value Date   WBC 7.6 09/23/2020   HGB 8.6 (L) 09/23/2020   HCT 26.0 (L) 09/23/2020   MCV 85.2 09/23/2020   PLT 275 09/23/2020    BMET    Component Value Date/Time   NA 141 09/23/2020 0400   NA 140 05/13/2020 1108   NA 142 09/28/2012 0831   K 3.4 (L) 09/23/2020 0400   K 4.2 09/28/2012 0831   CL 104 09/23/2020 0400   CL 103 09/28/2012 0831   CO2 24 09/23/2020 0400   CO2 30 (H) 09/28/2012 0831   GLUCOSE 139 (H) 09/23/2020 0400   GLUCOSE 103 (H) 09/28/2012 0831   BUN 65 (H) 09/23/2020 0400   BUN 47 (H) 05/13/2020 1108   BUN 18.3 09/28/2012 0831   CREATININE 9.54 (H)  09/23/2020 0400   CREATININE 2.09 (H) 01/18/2017 1145   CREATININE 1.2 (H) 09/28/2012 0831   CALCIUM 8.2 (L) 09/23/2020 0400   CALCIUM 9.4 09/28/2012 0831   GFRNONAA 4 (L) 09/23/2020 0400   GFRNONAA 23 (L) 12/09/2016 1458   GFRAA 8 (L) 05/13/2020 1108   GFRAA 26 (L) 12/09/2016 1458   Estimated Creatinine Clearance: 4.5 mL/min (A) (by C-G formula based on SCr of 9.54 mg/dL (H)).  COAG Lab Results  Component Value Date   INR 1.1 09/21/2020   INR 1.4 (H) 05/13/2020   INR 1.6 (H) 04/29/2020     Non-Invasive Vascular Imaging:  Venogram IMPRESSION: 1. 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 2. Technically successful right EJ Trialysis catheter placement.  ASSESSMENT/PLAN:  AKI on CKD now requiring HD via Temp cath placed by IR. She has a previous left AV fistula placed in 2020 by Dr. Donzetta Matters.  The left innominate vein is occluded.    She will be taken to the OR tomorrow for left AV fistula ligation and TDC exchange.  She has not had vein mapping in over 2 years.  We will repeat the vein mapping of the right UE and plan new access in the future.    NPO past MN   Roxy Horseman 09/23/2020 11:14 AM   History and exam details as above.  Patient has venous hypertension in her left upper extremity with a pre-existing left arm AV fistula.  She has a long segment occlusion of the left innominate vein.  She will need ligation of her left arm AV fistula and placement of a tunneled dialysis catheter.  We will plan for a new access after she has healed up some from this procedure.  We will hold her heparin at midnight.  N.p.o. after midnight  Consent  Procedure risks benefits details were discussed with the patient today and she understands and agrees to proceed.  Ruta Hinds, MD [facility] Office: [phone]

## 2020-09-23 NOTE — Progress Notes (Signed)
PROGRESS NOTE    AVABELLA WAILES  NGE:[id number] DOB: April 27, 1951 DOA: 09/21/2020 PCP: Maximiano Coss, NP    Brief Narrative:  70 year old female with history of chronic kidney disease stage V not on dialysis, chronic diastolic congestive heart failure, paroxysmal A. fib on Coumadin, thoracic aortic aneurysm repair 2020, hypertension, history of stroke presented to the hospital with cough with yellow sputum, shortness of breath, orthopnea and subjective fever.  In the emergency room blood pressure 190s/110, 87% on room air.  EKG with sinus tachycardia.  Chest x-ray with cardiomegaly and bilateral lower lung airspace opacities.  BUN 114/creatinine 14.2.  Initial hemoglobin 7.3.  She was admitted on oxygen, treated with antibiotics, cardiology and nephrology consulted.  Hemoglobin dropped to 6.6.  Started on hemodialysis.  Patient was started on hemodialysis with her left arm AV fistula that infiltrated giving severe pain.  A temporary HD catheter was placed on the right IJ on 4/24 and received first dialysis.   Assessment & Plan:   Principal Problem:   Acute renal failure superimposed on chronic kidney disease (HCC) Active Problems:   Chronic obstructive lung disease (HCC)   History of CVA (cerebrovascular accident)   AF (paroxysmal atrial fibrillation) (HCC)   Chest pain   Troponin level elevated   Acute respiratory failure with hypoxia ([place])   Community acquired pneumonia   Uremia   Metabolic acidosis   Hypertensive urgency  Acute hypoxemic respiratory failure secondary to volume overload in the setting of renal failure, diastolic dysfunction: Patient needing fluid removal.  Currently remains on oxygen.  Keep on oxygen to keep saturations more than 90%.  Suspected community-acquired pneumonia: WBC normal.  Procalcitonin 5.2.  Was started on broad-spectrum antibiotics.  Will treat with doxycycline for 5 days. Start mobility, chest physiotherapy, respiratory therapy and incentive  spirometry and flutter valve today.  It is difficult to participate on chest physiotherapy with severe left arm pain but she will try.  Acute kidney injury on CKD stage V: With fluid overload.  Acute on chronic anemia of chronic kidney disease. Temporary HD catheter right IJ, first dialysis on 4/24.  Followed by nephrology. Hemoglobin 6.6-2 unit PRBC-appropriately responded.  Continue to monitor.  Normal folic acid and M01.  May benefit with IV iron. Infiltrated left upper extremity fistula with severe pain, currently no evidence of compartment syndrome.  Followed by vascular and plan for exploration 4/26 in OR.  Paroxysmal A. fib: Currently sinus rhythm.  Cardiology recommended symptomatic treatment.  On Toprol-XL 75 mg daily.  Coumadin redosed.  Remains on heparin infusion peri operative.  Hypertensive urgency: Controlled with resumption of Norvasc, metoprolol, hydralazine.  History of stroke: No new deficits.  On a statin and anticoagulation.   DVT prophylaxis: Heparin infusion.   Code Status: Full code Family Communication: None at the bedside Disposition Plan: Status is: Inpatient  Remains inpatient appropriate because:Persistent severe electrolyte disturbances and IV treatments appropriate due to intensity of illness or inability to take PO   Dispo: The patient is from: Home              Anticipated d/c is to: Home              Patient currently is not medically stable to d/c.   Difficult to place patient No         Consultants:   Cardiology  Nephrology  Vascular surgery  Procedures:   HD catheter placement 4/24.  Hemodialysis 4/24  Antimicrobials:  Anti-infectives (From admission, onward)   Start  Dose/Rate Route Frequency Ordered Stop   09/22/20 2230  azithromycin (ZITHROMAX) 500 mg in sodium chloride 0.9 % 250 mL IVPB  Status:  Discontinued        500 mg 250 mL/hr over 60 Minutes Intravenous Every 24 hours 09/21/20 2313 09/22/20 1211   09/22/20 2200   cefTRIAXone (ROCEPHIN) 2 g in sodium chloride 0.9 % 100 mL IVPB        2 g 200 mL/hr over 30 Minutes Intravenous Every 24 hours 09/21/20 2313 09/26/20 2159   09/22/20 1300  doxycycline (VIBRA-TABS) tablet 100 mg        100 mg Oral Every 12 hours 09/22/20 1211     09/21/20 2215  cefTRIAXone (ROCEPHIN) 1 g in sodium chloride 0.9 % 100 mL IVPB        1 g 200 mL/hr over 30 Minutes Intravenous  Once 09/21/20 2211 09/21/20 2352   09/21/20 2215  azithromycin (ZITHROMAX) 500 mg in sodium chloride 0.9 % 250 mL IVPB        500 mg 250 mL/hr over 60 Minutes Intravenous  Once 09/21/20 2211 09/22/20 0020         Subjective: Patient seen and examined.  Continues to cough with some yellow phlegm.  Pain is severe on the left forearm.  Anxious and stressed out because of the pain.  Afebrile.  Telemetry with sinus tachycardia.  Objective: Vitals:   09/22/20 2200 09/23/20 0000 09/23/20 0428 09/23/20 0719  BP: (!) 174/112 (!) 170/109 (!) 167/94 (!) 180/127  Pulse: 87 88 81 86  Resp: 20 18 20 19   Temp:  98.8 F (37.1 C) 98.8 F (37.1 C)   TempSrc:  Oral Oral   SpO2: 97% 97% 98% 95%  Weight:      Height:        Intake/Output Summary (Last 24 hours) at 09/23/2020 1138 Last data filed at 09/23/2020 9563 Gross per 24 hour  Intake 1038.61 ml  Output 1075 ml  Net -36.39 ml   Filed Weights   09/21/20 2001 09/22/20 0154 09/22/20 1245  Weight: 46.7 kg 49 kg 50.9 kg    Examination:  General exam: Appears anxious.  In mild distress.  Chronically sick looking.  Currently on 2 L oxygen. Respiratory system: Mostly conducted airway sounds.  Expiratory crackles present. Cardiovascular system: S1 & S2 heard, RRR.  Tachycardic.   Gastrointestinal system: Abdomen is nondistended, soft and nontender. No organomegaly or masses felt. Normal bowel sounds heard. Central nervous system: Alert and oriented. No focal neurological deficits. Extremities: Edematous left arm, tender.  Distal vasculature  intact.    Data Reviewed: I have personally reviewed following labs and imaging studies  CBC: Recent Labs  Lab 09/21/20 2021 09/22/20 0447 09/22/20 1250 09/22/20 2023 09/23/20 0400  WBC 8.4 7.0 6.4  --  7.6  NEUTROABS 7.0  --   --   --   --   HGB 7.3* 6.6* 5.5* 8.5* 8.6*  HCT 23.4* 21.0* 17.9* 25.3* 26.0*  MCV 91.4 90.1 90.9  --  85.2  PLT 411* 372 330  --  875   Basic Metabolic Panel: Recent Labs  Lab 09/21/20 2021 09/22/20 0447 09/22/20 1303 09/23/20 0400  NA 141 142 139 141  K 4.1 3.6 3.5 3.4*  CL 105 107 105 104  CO2 17* 16* 18* 24  GLUCOSE 101* 111* 121* 139*  BUN 114* 113* 113* 65*  CREATININE 14.20* 13.93* 13.98* 9.54*  CALCIUM 7.6* 7.4* 7.2* 8.2*  PHOS  --   --  8.2*  --    GFR: Estimated Creatinine Clearance: 4.5 mL/min (A) (by C-G formula based on SCr of 9.54 mg/dL (H)). Liver Function Tests: Recent Labs  Lab 09/21/20 2021 09/22/20 1303  AST 32  --   ALT 27  --   ALKPHOS 114  --   BILITOT 0.8  --   PROT 6.8  --   ALBUMIN 3.0* 2.5*   No results for input(s): LIPASE, AMYLASE in the last 168 hours. No results for input(s): AMMONIA in the last 168 hours. Coagulation Profile: Recent Labs  Lab 09/21/20 2021  INR 1.1   Cardiac Enzymes: No results for input(s): CKTOTAL, CKMB, CKMBINDEX, TROPONINI in the last 168 hours. BNP (last 3 results) No results for input(s): PROBNP in the last 8760 hours. HbA1C: No results for input(s): HGBA1C in the last 72 hours. CBG: No results for input(s): GLUCAP in the last 168 hours. Lipid Profile: No results for input(s): CHOL, HDL, LDLCALC, TRIG, CHOLHDL, LDLDIRECT in the last 72 hours. Thyroid Function Tests: No results for input(s): TSH, T4TOTAL, FREET4, T3FREE, THYROIDAB in the last 72 hours. Anemia Panel: Recent Labs    09/22/20 0447  VITAMINB12 448  FOLATE 16.2  FERRITIN 198  TIBC 234*  IRON 11*  RETICCTPCT 1.2   Sepsis Labs: Recent Labs  Lab 09/21/20 2021 09/21/20 2300 09/22/20 0447  09/23/20 0400  PROCALCITON  --  5.12 5.24 4.26  LATICACIDVEN 1.0  --   --   --     Recent Results (from the past 240 hour(s))  Blood culture (routine single)     Status: None (Preliminary result)   Collection Time: 09/21/20  8:19 PM   Specimen: BLOOD RIGHT ARM  Result Value Ref Range Status   Specimen Description BLOOD RIGHT ARM  Final   Special Requests   Final    BOTTLES DRAWN AEROBIC AND ANAEROBIC Blood Culture results may not be optimal due to an excessive volume of blood received in culture bottles   Culture   Final    NO GROWTH 2 DAYS Performed at [facility], [place] [address] [place], [place] [address]    Report Status PENDING  Incomplete  Urine culture     Status: None   Collection Time: 09/21/20  8:19 PM   Specimen: In/Out Cath Urine  Result Value Ref Range Status   Specimen Description IN/OUT CATH URINE  Final   Special Requests NONE  Final   Culture   Final    NO GROWTH Performed at [facility], [place] [address] [place], [place] [address]    Report Status 09/23/2020 FINAL  Final  Resp Panel by RT-PCR (Flu A&B, Covid) Nasopharyngeal Swab     Status: None   Collection Time: 09/21/20  8:54 PM   Specimen: Nasopharyngeal Swab; Nasopharyngeal(NP) swabs in vial transport medium  Result Value Ref Range Status   SARS Coronavirus 2 by RT PCR NEGATIVE NEGATIVE Final    Comment: (NOTE) SARS-CoV-2 target nucleic acids are NOT DETECTED.  The SARS-CoV-2 RNA is generally detectable in upper respiratory specimens during the acute phase of infection. The lowest concentration of SARS-CoV-2 viral copies this assay can detect is 138 copies/mL. A negative result does not preclude SARS-Cov-2 infection and should not be used as the sole basis for treatment or other patient management decisions. A negative result may occur with  improper specimen collection/handling, submission of specimen other than nasopharyngeal swab, presence of viral mutation(s) within the areas  targeted by this assay, and inadequate  number of viral copies(<138 copies/mL). A negative result must be combined with clinical observations, patient history, and epidemiological information. The expected result is Negative.  Fact Sheet for Patients:  EntrepreneurPulse.com.au  Fact Sheet for Healthcare Providers:  IncredibleEmployment.be  This test is no t yet approved or cleared by the [place] FDA and  has been authorized for detection and/or diagnosis of SARS-CoV-2 by FDA under an Emergency Use Authorization (EUA). This EUA will remain  in effect (meaning this test can be used) for the duration of the COVID-19 declaration under Section 564(b)(1) of the Act, 21 U.S.C.section 360bbb-3(b)(1), unless the authorization is terminated  or revoked sooner.       Influenza A by PCR NEGATIVE NEGATIVE Final   Influenza B by PCR NEGATIVE NEGATIVE Final    Comment: (NOTE) The Xpert Xpress SARS-CoV-2/FLU/RSV plus assay is intended as an aid in the diagnosis of influenza from Nasopharyngeal swab specimens and should not be used as a sole basis for treatment. Nasal washings and aspirates are unacceptable for Xpert Xpress SARS-CoV-2/FLU/RSV testing.  Fact Sheet for Patients: EntrepreneurPulse.com.au  Fact Sheet for Healthcare Providers: IncredibleEmployment.be  This test is not yet approved or cleared by the [place] FDA and has been authorized for detection and/or diagnosis of SARS-CoV-2 by FDA under an Emergency Use Authorization (EUA). This EUA will remain in effect (meaning this test can be used) for the duration of the COVID-19 declaration under Section 564(b)(1) of the Act, 21 U.S.C. section 360bbb-3(b)(1), unless the authorization is terminated or revoked.  Performed at [facility], [place] [address], [place], [place] [address]   MRSA PCR Screening     Status: None   Collection Time: 09/22/20   2:02 AM   Specimen: Nasal Mucosa; Nasopharyngeal  Result Value Ref Range Status   MRSA by PCR NEGATIVE NEGATIVE Final    Comment:        The GeneXpert MRSA Assay (FDA approved for NASAL specimens only), is one component of a comprehensive MRSA colonization surveillance program. It is not intended to diagnose MRSA infection nor to guide or monitor treatment for MRSA infections. Performed at [facility], [place] [address] [place], [place] [address]          Radiology Studies: IR Venocavagram Svc  Result Date: 09/22/2020 CLINICAL DATA:  Renal failure, progressive. Previously placed dialysis fistula could not be utilized for dialysis and a temporary hemodialysis catheter is requested. EXAM: EXAM LEFT JUGULAR VENOGRAM RIGHT IJ CATHETER PLACEMENT UNDER  FLUOROSCOPIC GUIDANCE ULTRASOUND GUIDANCE FOR VASCULAR ACCESS X 2 TECHNIQUE: The procedure, risks (including but not limited to bleeding, infection, organ damage, pneumothorax), benefits, and alternatives were explained to the patient. Questions regarding the procedure were encouraged and answered. The patient understands and consents to the procedure. Initially, a left-sided approach was selected due to the available catheters. Patency of the left IJ vein was confirmed with ultrasound with image documentation. An appropriate skin site was determined. Skin site was marked. Region was prepped using maximum barrier technique including cap and mask, sterile gown, sterile gloves, large sterile sheet, and Chlorhexidine as cutaneous antisepsis. The region was infiltrated locally with 1% lidocaine. Under real-time ultrasound guidance, the vein was accessed with a 21-gauge micropuncture needle, exchanged over a 018 guidewire for a transitional dilator, through which a 035 guidewire was advanced. This would not pass centrally to the SVC. Venogram was performed. 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 Patency of the right external jugular vein was confirmed with ultrasound with image documentation. An appropriate skin site was determined. Skin site was marked. Region was prepped using maximum barrier technique including cap and mask, sterile gown, sterile gloves, large sterile sheet, and Chlorhexidine as cutaneous antisepsis. The region was infiltrated locally with 1% lidocaine. Under real-time ultrasound guidance, the vein was accessed with a 21-gauge micropuncture needle, exchanged over a 018 guidewire for a transitional dilator, through which a 035 guidewire was advanced, allowing passage of vascular dilator which allowed advancement of a 20 cm Trialysis catheter. This was positioned with the tip mid right atrium. Spot chest radiograph shows good positioning and no pneumothorax. Catheter was flushed and sutured externally with 0-Prolene sutures. Patient tolerated the procedure well. FLUOROSCOPY TIME:  54 seconds; 35.7 mGy COMPLICATIONS: COMPLICATIONS none IMPRESSION: 1. 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 2. Technically successful right EJ Trialysis catheter placement. Electronically Signed   By: Lucrezia Europe M.D.   On: 09/22/2020 12:48   IR Fluoro Guide CV Line Right  Result Date: 09/22/2020 CLINICAL DATA:  Renal failure, progressive. Previously placed dialysis fistula could not be utilized for dialysis and a temporary hemodialysis catheter is requested. EXAM: EXAM LEFT JUGULAR VENOGRAM RIGHT IJ CATHETER PLACEMENT UNDER  FLUOROSCOPIC GUIDANCE ULTRASOUND GUIDANCE FOR VASCULAR ACCESS X 2 TECHNIQUE: The procedure, risks (including but not limited to bleeding, infection, organ damage, pneumothorax), benefits, and alternatives were explained to the patient. Questions regarding the procedure were encouraged and answered. The patient understands and  consents to the procedure. Initially, a left-sided approach was selected due to the available catheters. Patency of the left IJ vein was confirmed with ultrasound with image documentation. An appropriate skin site was determined. Skin site was marked. Region was prepped using maximum barrier technique including cap and mask, sterile gown, sterile gloves, large sterile sheet, and Chlorhexidine as cutaneous antisepsis. The region was infiltrated locally with 1% lidocaine. Under real-time ultrasound guidance, the vein was accessed with a 21-gauge micropuncture needle, exchanged over a 018 guidewire for a transitional dilator, through which a 035 guidewire was advanced. This would not pass centrally to the SVC. Venogram was performed. 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 Patency of the right external jugular vein was confirmed with ultrasound with image documentation. An appropriate skin site was determined. Skin site was marked. Region was prepped using maximum barrier technique including cap and mask, sterile gown, sterile gloves, large sterile sheet, and Chlorhexidine as cutaneous antisepsis. The region was infiltrated locally with 1% lidocaine. Under real-time ultrasound guidance, the vein was accessed with a 21-gauge micropuncture needle, exchanged over a 018 guidewire for a transitional dilator, through which a 035 guidewire was advanced, allowing passage of vascular dilator which allowed advancement of a 20 cm Trialysis catheter. This was positioned with the tip mid right atrium. Spot chest radiograph shows good positioning and no pneumothorax. Catheter was flushed and sutured externally with 0-Prolene sutures. Patient tolerated the procedure well. FLUOROSCOPY TIME:  54 seconds; 01.7 mGy COMPLICATIONS: COMPLICATIONS none IMPRESSION: 1. 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 2. Technically successful right EJ Trialysis catheter placement. Electronically Signed   By: Lucrezia Europe M.D.   On: 09/22/2020 12:48   IR US Guide Vasc Access Left  Result Date:  09/22/2020 CLINICAL DATA:  Renal failure, progressive. Previously placed dialysis fistula could not be utilized for dialysis and a temporary hemodialysis catheter is requested. EXAM: EXAM LEFT JUGULAR VENOGRAM RIGHT IJ CATHETER PLACEMENT UNDER  FLUOROSCOPIC GUIDANCE ULTRASOUND GUIDANCE FOR VASCULAR ACCESS X 2 TECHNIQUE: The procedure, risks (including but not limited to bleeding, infection, organ damage, pneumothorax), benefits, and alternatives were explained to the patient. Questions regarding the procedure were encouraged and answered. The patient understands and consents to the procedure. Initially, a left-sided approach was selected due to the available catheters. Patency of the left IJ vein was confirmed with ultrasound with image documentation. An appropriate skin site was determined. Skin site was marked. Region was prepped using maximum barrier technique including cap and mask, sterile gown, sterile gloves, large sterile sheet, and Chlorhexidine as cutaneous antisepsis. The region was infiltrated locally with 1% lidocaine. Under real-time ultrasound guidance, the vein was accessed with a 21-gauge micropuncture needle, exchanged over a 018 guidewire for a transitional dilator, through which a 035 guidewire was advanced. This would not pass centrally to the SVC. Venogram was performed. 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 Patency of the right external jugular vein was confirmed with ultrasound with image documentation. An appropriate skin site was determined. Skin site was marked. Region was prepped using maximum barrier technique including cap and  mask, sterile gown, sterile gloves, large sterile sheet, and Chlorhexidine as cutaneous antisepsis. The region was infiltrated locally with 1% lidocaine. Under real-time ultrasound guidance, the vein was accessed with a 21-gauge micropuncture needle, exchanged over a 018 guidewire for a transitional dilator, through which a 035 guidewire was advanced, allowing passage of vascular dilator which allowed advancement of a 20 cm Trialysis catheter. This was positioned with the tip mid right atrium. Spot chest radiograph shows good positioning and no pneumothorax. Catheter was flushed and sutured externally with 0-Prolene sutures. Patient tolerated the procedure well. FLUOROSCOPY TIME:  54 seconds; 94.4 mGy COMPLICATIONS: COMPLICATIONS none IMPRESSION: 1. 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 2. Technically successful right EJ Trialysis catheter placement. Electronically Signed   By: Lucrezia Europe M.D.   On: 09/22/2020 12:48   IR US Guide Vasc Access Right  Result Date: 09/22/2020 CLINICAL DATA:  Renal failure, progressive. Previously placed dialysis fistula could not be utilized for dialysis and a temporary hemodialysis catheter is requested. EXAM: EXAM LEFT JUGULAR VENOGRAM RIGHT IJ CATHETER PLACEMENT UNDER  FLUOROSCOPIC GUIDANCE ULTRASOUND GUIDANCE FOR VASCULAR ACCESS X 2 TECHNIQUE: The procedure, risks (including but not limited to bleeding, infection, organ damage, pneumothorax), benefits, and alternatives were explained to the patient. Questions regarding the procedure were encouraged and answered. The patient understands and consents to the procedure. Initially, a left-sided approach was selected due to the available catheters. Patency of the left IJ vein was confirmed with ultrasound with image documentation. An appropriate skin site was determined. Skin site was marked. Region was prepped using maximum barrier technique  including cap and mask, sterile gown, sterile gloves, large sterile sheet, and Chlorhexidine as cutaneous antisepsis. The region was infiltrated locally with 1% lidocaine. Under real-time ultrasound guidance, the vein was accessed with a 21-gauge micropuncture needle, exchanged over a 018 guidewire for a transitional dilator, through which a 035 guidewire was advanced. This would not pass centrally to the SVC. Venogram was performed. This demonstrated long segment occlusion of the left innominate  vein with immature thyrocervical collateral network to the right external jugular system. Patency of the right external jugular vein was confirmed with ultrasound with image documentation. An appropriate skin site was determined. Skin site was marked. Region was prepped using maximum barrier technique including cap and mask, sterile gown, sterile gloves, large sterile sheet, and Chlorhexidine as cutaneous antisepsis. The region was infiltrated locally with 1% lidocaine. Under real-time ultrasound guidance, the vein was accessed with a 21-gauge micropuncture needle, exchanged over a 018 guidewire for a transitional dilator, through which a 035 guidewire was advanced, allowing passage of vascular dilator which allowed advancement of a 20 cm Trialysis catheter. This was positioned with the tip mid right atrium. Spot chest radiograph shows good positioning and no pneumothorax. Catheter was flushed and sutured externally with 0-Prolene sutures. Patient tolerated the procedure well. FLUOROSCOPY TIME:  54 seconds; 32.3 mGy COMPLICATIONS: COMPLICATIONS none IMPRESSION: 1. 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 2. Technically successful right EJ Trialysis catheter placement. Electronically Signed   By: Lucrezia Europe M.D.   On: 09/22/2020 12:48   DG Chest Port 1 View  Result Date: 09/21/2020 CLINICAL DATA:  Shortness of breath, fever EXAM:  PORTABLE CHEST 1 VIEW COMPARISON:  Chest radiograph dated 11/30/2019 FINDINGS: The heart is enlarged. Mild-to-moderate bilateral lower lung predominant airspace opacities are noted. A small left pleural effusion may contribute. There is no right pleural effusion. There is no pneumothorax. Degenerative changes are seen in the spine and right shoulder. IMPRESSION: 1. Mild-to-moderate bilateral lower lung predominant airspace opacities likely represent pneumonia. A small left pleural effusion may contribute. 2. Cardiomegaly. Electronically Signed   By: Zerita Boers M.D.   On: 09/21/2020 20:53   ECHOCARDIOGRAM COMPLETE  Result Date: 09/22/2020    ECHOCARDIOGRAM REPORT   Patient Name:   ARIEANA SOMOZA Date of Exam: 09/22/2020 Medical Rec #:  [id number]          Height:       63.0 in Accession #:    [id number]         Weight:       108.0 lb Date of Birth:  [birth year]           BSA:          1.488 m Patient Age:    42 years           BP:           125/90 mmHg Patient Gender: F                  HR:           96 bpm. Exam Location:  Inpatient Procedure: 2D Echo, Cardiac Doppler and Color Doppler Indications:    R07.9* Chest pain, unspecified  History:        Patient has prior history of Echocardiogram examinations, most                 recent 08/28/2019. Stroke, Arrythmias:Atrial Fibrillation,                 Signs/Symptoms:Shortness of Breath and Chest Pain; Risk                 Factors:Hypertension.  Sonographer:    Bernadene Person RDCS Referring Phys: [id number] ROBIN Sayre  1. Apical hypokinesis with overall normal LV function.  2. Left ventricular ejection fraction, by estimation, is 55 to 60%. The left ventricle has  normal function. The left ventricle demonstrates regional wall motion abnormalities (see scoring diagram/findings for description). There is moderate left ventricular hypertrophy. Left ventricular diastolic parameters are consistent with Grade II diastolic dysfunction (pseudonormalization).   3. Right ventricular systolic function is normal. The right ventricular size is normal. There is mildly elevated pulmonary artery systolic pressure.  4. Left atrial size was severely dilated.  5. Right atrial size was mildly dilated.  6. The mitral valve is normal in structure. Mild mitral valve regurgitation. No evidence of mitral stenosis.  7. The aortic valve is tricuspid. Aortic valve regurgitation is not visualized. No aortic stenosis is present.  8. The inferior vena cava is normal in size with greater than 50% respiratory variability, suggesting right atrial pressure of 3 mmHg. FINDINGS  Left Ventricle: Left ventricular ejection fraction, by estimation, is 55 to 60%. The left ventricle has normal function. The left ventricle demonstrates regional wall motion abnormalities. The left ventricular internal cavity size was normal in size. There is moderate left ventricular hypertrophy. Left ventricular diastolic parameters are consistent with Grade II diastolic dysfunction (pseudonormalization). Right Ventricle: The right ventricular size is normal. Right ventricular systolic function is normal. There is mildly elevated pulmonary artery systolic pressure. The tricuspid regurgitant velocity is 3.24 m/s, and with an assumed right atrial pressure of 3 mmHg, the estimated right ventricular systolic pressure is 84.1 mmHg. Left Atrium: Left atrial size was severely dilated. Right Atrium: Right atrial size was mildly dilated. Pericardium: There is no evidence of pericardial effusion. Mitral Valve: The mitral valve is normal in structure. Mild mitral valve regurgitation. No evidence of mitral valve stenosis. Tricuspid Valve: The tricuspid valve is normal in structure. Tricuspid valve regurgitation is mild . No evidence of tricuspid stenosis. Aortic Valve: The aortic valve is tricuspid. Aortic valve regurgitation is not visualized. No aortic stenosis is present. Pulmonic Valve: The pulmonic valve was normal in structure.  Pulmonic valve regurgitation is not visualized. No evidence of pulmonic stenosis. Aorta: The aortic root is normal in size and structure. Venous: The inferior vena cava is normal in size with greater than 50% respiratory variability, suggesting right atrial pressure of 3 mmHg. IAS/Shunts: The interatrial septum is aneurysmal. No atrial level shunt detected by color flow Doppler. Additional Comments: Apical hypokinesis with overall normal LV function.  LEFT VENTRICLE PLAX 2D LVIDd:         3.60 cm LVIDs:         2.80 cm LV PW:         1.50 cm LV IVS:        1.10 cm LVOT diam:     1.80 cm LV SV:         63 LV SV Index:   42 LVOT Area:     2.54 cm  LV Volumes (MOD) LV vol d, MOD A2C: 112.0 ml LV vol d, MOD A4C: 95.9 ml LV vol s, MOD A2C: 49.9 ml LV vol s, MOD A4C: 48.0 ml LV SV MOD A2C:     62.1 ml LV SV MOD A4C:     95.9 ml LV SV MOD BP:      53.4 ml RIGHT VENTRICLE TAPSE (M-mode): 1.7 cm LEFT ATRIUM             Index       RIGHT ATRIUM           Index LA diam:        3.20 cm 2.15 cm/m  RA Area:     18.10  cm LA Vol (A2C):   82.0 ml 55.10 ml/m RA Volume:   46.30 ml  31.11 ml/m LA Vol (A4C):   75.0 ml 50.39 ml/m LA Biplane Vol: 82.4 ml 55.37 ml/m  AORTIC VALVE LVOT Vmax:   149.50 cm/s LVOT Vmean:  100.150 cm/s LVOT VTI:    0.246 m  AORTA Ao Root diam: 2.60 cm Ao Asc diam:  2.80 cm MR PISA:        0.57 cm TRICUSPID VALVE MR PISA Radius: 0.30 cm  TR Peak grad:   42.0 mmHg                          TR Vmax:        324.00 cm/s                           SHUNTS                          Systemic VTI:  0.25 m                          Systemic Diam: 1.80 cm Kirk Ruths MD Electronically signed by Kirk Ruths MD Signature Date/Time: 09/22/2020/11:02:07 AM    Final         Scheduled Meds: . sodium chloride   Intravenous Once  . amLODipine  10 mg Oral Daily  . calcium acetate  667 mg Oral TID WC  . Chlorhexidine Gluconate Cloth  6 each Topical Q0600  . Chlorhexidine Gluconate Cloth  6 each Topical Q0600  .  darbepoetin (ARANESP) injection - DIALYSIS  100 mcg Intravenous Q Mon-HD  . dextromethorphan-guaiFENesin  1 tablet Oral BID  . doxycycline  100 mg Oral Q12H  . furosemide  80 mg Intravenous Q12H  . hydrALAZINE  25 mg Oral BID  . metoprolol succinate  75 mg Oral Daily  . multivitamin  1 tablet Oral QHS  . nitroGLYCERIN  0.2 mg Transdermal Daily  . rosuvastatin  10 mg Oral Daily   Continuous Infusions: . sodium chloride    . cefTRIAXone (ROCEPHIN)  IV 2 g (09/22/20 2052)  . ferric gluconate (FERRLECIT/NULECIT) IV    . heparin 1,000 Units/hr (09/23/20 0388)     LOS: 2 days    Time spent: 35 minutes    Barb Merino, MD [facility] Pager [phone]

## 2020-09-24 ENCOUNTER — Inpatient Hospital Stay (HOSPITAL_COMMUNITY): Payer: Medicare Other | Admitting: Certified Registered"

## 2020-09-24 ENCOUNTER — Encounter (HOSPITAL_COMMUNITY)
Admission: EM | Disposition: A | Discharge status: Home Health | Payer: Self-pay | Source: Home / Self Care | Attending: Internal Medicine

## 2020-09-24 ENCOUNTER — Encounter (HOSPITAL_COMMUNITY): Payer: Self-pay | Admitting: Family Medicine

## 2020-09-24 ENCOUNTER — Inpatient Hospital Stay (HOSPITAL_COMMUNITY): Payer: Medicare Other

## 2020-09-24 DIAGNOSIS — N181 Chronic kidney disease, stage 1: Secondary | ICD-10-CM

## 2020-09-24 DIAGNOSIS — N170 Acute kidney failure with tubular necrosis: Secondary | ICD-10-CM

## 2020-09-24 HISTORY — PX: INSERTION OF DIALYSIS CATHETER: SHX1324

## 2020-09-24 HISTORY — PX: LIGATION OF ARTERIOVENOUS  FISTULA: SHX5948

## 2020-09-24 LAB — BASIC METABOLIC PANEL
Anion gap: 10 (ref 5–15)
BUN: 30 mg/dL — ABNORMAL HIGH (ref 8–23)
CO2: 26 mmol/L (ref 22–32)
Calcium: 8.3 mg/dL — ABNORMAL LOW (ref 8.9–10.3)
Chloride: 99 mmol/L (ref 98–111)
Creatinine, Ser: 5.73 mg/dL — ABNORMAL HIGH (ref 0.44–1.00)
GFR, Estimated: 8 mL/min — ABNORMAL LOW (ref >60)
Glucose, Bld: 132 mg/dL — ABNORMAL HIGH (ref 70–99)
Potassium: 3.5 mmol/L (ref 3.5–5.1)
Sodium: 135 mmol/L (ref 135–145)

## 2020-09-24 LAB — CBC
HCT: 26.4 % — ABNORMAL LOW (ref 36.0–46.0)
Hemoglobin: 8.8 g/dL — ABNORMAL LOW (ref 12.0–15.0)
MCH: 28.6 pg (ref 26.0–34.0)
MCHC: 33.3 g/dL (ref 30.0–36.0)
MCV: 85.7 fL (ref 80.0–100.0)
Platelets: 267 10*3/uL (ref 150–400)
RBC: 3.08 MIL/uL — ABNORMAL LOW (ref 3.87–5.11)
RDW: 16.6 % — ABNORMAL HIGH (ref 11.5–15.5)
WBC: 8.3 10*3/uL (ref 4.0–10.5)
nRBC: 0 % (ref 0.0–0.2)

## 2020-09-24 LAB — GLUCOSE, CAPILLARY
Glucose-Capillary: 114 mg/dL — ABNORMAL HIGH (ref 70–99)
Glucose-Capillary: 108 mg/dL — ABNORMAL HIGH (ref 70–99)
Glucose-Capillary: 153 mg/dL — ABNORMAL HIGH (ref 70–99)

## 2020-09-24 LAB — HEPARIN LEVEL (UNFRACTIONATED): Heparin Unfractionated: 0.3 IU/mL (ref 0.30–0.70)

## 2020-09-24 SURGERY — LIGATION OF ARTERIOVENOUS  FISTULA
Anesthesia: Monitor Anesthesia Care | Site: Chest | Laterality: Right

## 2020-09-24 MED ORDER — HEPARIN SODIUM (PORCINE) 1000 UNIT/ML IJ SOLN
INTRAMUSCULAR | Status: DC | PRN
  Administered 2020-09-24: 3800 [IU]

## 2020-09-24 MED ORDER — CHLORHEXIDINE GLUCONATE 0.12 % MT SOLN
OROMUCOSAL | Status: AC
  Filled 2020-09-24: qty 15

## 2020-09-24 MED ORDER — 0.9 % SODIUM CHLORIDE (POUR BTL) OPTIME
TOPICAL | Status: DC | PRN
  Administered 2020-09-24: 1000 mL

## 2020-09-24 MED ORDER — FENTANYL CITRATE (PF) 250 MCG/5ML IJ SOLN
INTRAMUSCULAR | Status: AC
  Filled 2020-09-24: qty 5

## 2020-09-24 MED ORDER — LIDOCAINE-EPINEPHRINE 0.5 %-1:200000 IJ SOLN
INTRAMUSCULAR | Status: AC
  Filled 2020-09-24: qty 1

## 2020-09-24 MED ORDER — MIDAZOLAM HCL 2 MG/2ML IJ SOLN
INTRAMUSCULAR | Status: AC
  Administered 2020-09-24: 1 mg via INTRAVENOUS
  Filled 2020-09-24: qty 2

## 2020-09-24 MED ORDER — CHLORHEXIDINE GLUCONATE CLOTH 2 % EX PADS
6.0000 | MEDICATED_PAD | Freq: Every day | CUTANEOUS | Status: DC
  Administered 2020-09-24 – 2020-09-26 (×3): 6 via TOPICAL

## 2020-09-24 MED ORDER — SODIUM CHLORIDE 0.9 % IV SOLN: INTRAVENOUS | Status: DC | PRN

## 2020-09-24 MED ORDER — LIDOCAINE-EPINEPHRINE 0.5 %-1:200000 IJ SOLN
INTRAMUSCULAR | Status: DC | PRN
  Administered 2020-09-24: 12 mL

## 2020-09-24 MED ORDER — LIDOCAINE 2% (20 MG/ML) 5 ML SYRINGE
INTRAMUSCULAR | Status: DC | PRN
  Administered 2020-09-24: 60 mg via INTRAVENOUS

## 2020-09-24 MED ORDER — ONDANSETRON HCL 4 MG/2ML IJ SOLN
INTRAMUSCULAR | Status: AC
  Filled 2020-09-24: qty 8

## 2020-09-24 MED ORDER — PROPOFOL 500 MG/50ML IV EMUL
INTRAVENOUS | Status: DC | PRN
  Administered 2020-09-24: 75 ug/kg/min via INTRAVENOUS

## 2020-09-24 MED ORDER — PROMETHAZINE HCL 25 MG/ML IJ SOLN: 6.2500 mg | INTRAMUSCULAR | Status: DC | PRN

## 2020-09-24 MED ORDER — PHENYLEPHRINE HCL-NACL 10-0.9 MG/250ML-% IV SOLN
INTRAVENOUS | Status: DC | PRN
  Administered 2020-09-24: 40 ug/min via INTRAVENOUS

## 2020-09-24 MED ORDER — PROPOFOL 10 MG/ML IV BOLUS
INTRAVENOUS | Status: DC | PRN
  Administered 2020-09-24: 15 mg via INTRAVENOUS

## 2020-09-24 MED ORDER — FENTANYL CITRATE (PF) 100 MCG/2ML IJ SOLN
INTRAMUSCULAR | Status: AC
  Administered 2020-09-24: 50 ug via INTRAVENOUS
  Filled 2020-09-24: qty 2

## 2020-09-24 MED ORDER — MIDAZOLAM HCL 2 MG/2ML IJ SOLN: 1.0000 mg | Freq: Once | INTRAMUSCULAR | Status: AC

## 2020-09-24 MED ORDER — FENTANYL CITRATE (PF) 100 MCG/2ML IJ SOLN: 25.0000 ug | INTRAMUSCULAR | Status: DC | PRN

## 2020-09-24 MED ORDER — HYDROMORPHONE HCL 1 MG/ML IJ SOLN
1.0000 mg | Freq: Once | INTRAMUSCULAR | Status: AC
  Administered 2020-09-24: 1 mg via INTRAVENOUS
  Filled 2020-09-24: qty 1

## 2020-09-24 MED ORDER — HEPARIN (PORCINE) 25000 UT/250ML-% IV SOLN
1100.0000 [IU]/h | INTRAVENOUS | Status: DC
  Administered 2020-09-24: 1000 [IU]/h via INTRAVENOUS
  Administered 2020-09-25: 1100 [IU]/h via INTRAVENOUS
  Filled 2020-09-24 (×4): qty 250

## 2020-09-24 MED ORDER — MIDAZOLAM HCL 2 MG/2ML IJ SOLN
INTRAMUSCULAR | Status: AC
  Filled 2020-09-24: qty 2

## 2020-09-24 MED ORDER — ONDANSETRON HCL 4 MG/2ML IJ SOLN
INTRAMUSCULAR | Status: DC | PRN
  Administered 2020-09-24: 4 mg via INTRAVENOUS

## 2020-09-24 MED ORDER — ACETAMINOPHEN 10 MG/ML IV SOLN: 1000.0000 mg | Freq: Once | INTRAVENOUS | Status: DC | PRN

## 2020-09-24 MED ORDER — LIDOCAINE-EPINEPHRINE (PF) 1.5 %-1:200000 IJ SOLN
INTRAMUSCULAR | Status: DC | PRN
  Administered 2020-09-24: 16 mL via PERINEURAL

## 2020-09-24 MED ORDER — FENTANYL CITRATE (PF) 100 MCG/2ML IJ SOLN
50.0000 ug | Freq: Once | INTRAMUSCULAR | Status: AC
Start: 2020-09-24 — End: 2020-09-24

## 2020-09-24 MED ORDER — HEPARIN SODIUM (PORCINE) 1000 UNIT/ML IJ SOLN
INTRAMUSCULAR | Status: AC
  Filled 2020-09-24: qty 1

## 2020-09-24 MED ORDER — SODIUM CHLORIDE 0.9 % IV SOLN
INTRAVENOUS | Status: AC
  Filled 2020-09-24: qty 1.2

## 2020-09-24 SURGICAL SUPPLY — 52 items
ADH SKN CLS APL DERMABOND .7 (GAUZE/BANDAGES/DRESSINGS) ×2
BAG DECANTER FOR FLEXI CONT (MISCELLANEOUS) ×3 IMPLANT
BIOPATCH RED 1 DISK 7.0 (GAUZE/BANDAGES/DRESSINGS) ×4 IMPLANT
CANISTER SUCT 3000ML PPV (MISCELLANEOUS) ×3 IMPLANT
CATH PALINDROME-L 14.5FX23 (CATHETERS) ×1 IMPLANT
CATH PALINDROME-P 19CM W/VT (CATHETERS) IMPLANT
CATH PALINDROME-P 23CM W/VT (CATHETERS) ×1 IMPLANT
CATH PALINDROME-P 28CM W/VT (CATHETERS) IMPLANT
COVER PROBE W GEL 5X96 (DRAPES) ×3 IMPLANT
COVER SURGICAL LIGHT HANDLE (MISCELLANEOUS) ×3 IMPLANT
DECANTER SPIKE VIAL GLASS SM (MISCELLANEOUS) ×3 IMPLANT
DERMABOND ADVANCED (GAUZE/BANDAGES/DRESSINGS) ×1
DERMABOND ADVANCED .7 DNX12 (GAUZE/BANDAGES/DRESSINGS) ×2 IMPLANT
DRAPE C-ARM 42X72 X-RAY (DRAPES) ×3 IMPLANT
DRAPE CHEST BREAST 15X10 FENES (DRAPES) ×3 IMPLANT
DRSG COVADERM 4X6 (GAUZE/BANDAGES/DRESSINGS) ×1 IMPLANT
ELECT REM PT RETURN 9FT ADLT (ELECTROSURGICAL) ×3
ELECTRODE REM PT RTRN 9FT ADLT (ELECTROSURGICAL) ×2 IMPLANT
GAUZE 4X4 16PLY RFD (DISPOSABLE) ×3 IMPLANT
GAUZE SPONGE 2X2 8PLY STRL LF (GAUZE/BANDAGES/DRESSINGS) IMPLANT
GLOVE SS BIOGEL STRL SZ 7.5 (GLOVE) ×2 IMPLANT
GLOVE SUPERSENSE BIOGEL SZ 7.5 (GLOVE) ×1
GOWN STRL REUS W/ TWL LRG LVL3 (GOWN DISPOSABLE) ×6 IMPLANT
GOWN STRL REUS W/TWL LRG LVL3 (GOWN DISPOSABLE) ×9
KIT BASIN OR (CUSTOM PROCEDURE TRAY) ×3 IMPLANT
KIT PALINDROME-P 55CM (CATHETERS) IMPLANT
KIT TURNOVER KIT B (KITS) ×3 IMPLANT
NDL 18GX1X1/2 (RX/OR ONLY) (NEEDLE) ×2 IMPLANT
NDL HYPO 25GX1X1/2 BEV (NEEDLE) ×2 IMPLANT
NEEDLE 18GX1X1/2 (RX/OR ONLY) (NEEDLE) ×3 IMPLANT
NEEDLE 22X1 1/2 (OR ONLY) (NEEDLE) IMPLANT
NEEDLE HYPO 25GX1X1/2 BEV (NEEDLE) ×3 IMPLANT
NS IRRIG 1000ML POUR BTL (IV SOLUTION) ×3 IMPLANT
PACK CV ACCESS (CUSTOM PROCEDURE TRAY) ×3 IMPLANT
PACK SURGICAL SETUP 50X90 (CUSTOM PROCEDURE TRAY) ×3 IMPLANT
PAD ARMBOARD 7.5X6 YLW CONV (MISCELLANEOUS) ×6 IMPLANT
SOAP 2 % CHG 4 OZ (WOUND CARE) ×3 IMPLANT
SPONGE GAUZE 2X2 STER 10/PKG (GAUZE/BANDAGES/DRESSINGS) ×1
SUT ETHILON 3 0 PS 1 (SUTURE) ×3 IMPLANT
SUT PROLENE 6 0 CC (SUTURE) IMPLANT
SUT SILK 0 TIES 10X30 (SUTURE) ×3 IMPLANT
SUT VIC AB 3-0 SH 27 (SUTURE) ×3
SUT VIC AB 3-0 SH 27X BRD (SUTURE) ×2 IMPLANT
SUT VICRYL 4-0 PS2 18IN ABS (SUTURE) ×3 IMPLANT
SYR 10ML LL (SYRINGE) ×3 IMPLANT
SYR 20ML LL LF (SYRINGE) ×3 IMPLANT
SYR 5ML LL (SYRINGE) ×6 IMPLANT
SYR CONTROL 10ML LL (SYRINGE) ×3 IMPLANT
TOWEL GREEN STERILE (TOWEL DISPOSABLE) ×6 IMPLANT
TOWEL GREEN STERILE FF (TOWEL DISPOSABLE) ×3 IMPLANT
UNDERPAD 30X36 HEAVY ABSORB (UNDERPADS AND DIAPERS) ×3 IMPLANT
WATER STERILE IRR 1000ML POUR (IV SOLUTION) ×3 IMPLANT

## 2020-09-24 NOTE — Progress Notes (Signed)
[place] for Heparin Indication: atrial fibrillation  Allergies  Allergen Reactions  . Shrimp [Shellfish Allergy] Shortness Of Breath  . Bactroban [Mupirocin] Other (See Comments)    "Sores in nose"  . Vicodin [Hydrocodone-Acetaminophen] Itching and Nausea And Vomiting    This is patient's home medication  . Eliquis [Apixaban] Rash  . Lisinopril Cough  . Tylenol [Acetaminophen] Itching    Patient Measurements: Height: 5\' 3"  (160 cm) Weight: 45 kg (99 lb 3.3 oz) IBW/kg (Calculated) : 52.4  Vital Signs: Temp: 98.3 F (36.8 C) (04/25 1645) Temp Source: Oral (04/25 1900) BP: 161/91 (04/25 1900) Pulse Rate: 78 (04/25 1900)  Labs: Recent Labs    09/21/20 2021 09/21/20 2300 09/22/20 0447 09/22/20 0618 09/22/20 1250 09/22/20 1303 09/22/20 2023 09/23/20 0400 09/23/20 0430 09/23/20 1454 09/23/20 2221  HGB 7.3*  --  6.6*  --  5.5*  --  8.5* 8.6*  --   --   --   HCT 23.4*  --  21.0*  --  17.9*  --  25.3* 26.0*  --   --   --   PLT 411*  --  372  --  330  --   --  275  --   --   --   APTT 40*  --   --   --   --   --   --   --   --   --   --   LABPROT 14.4  --   --   --   --   --   --   --   --   --   --   INR 1.1  --   --   --   --   --   --   --   --   --   --   HEPARINUNFRC  --   --   --    < >  --   --  0.19*  --  0.18* 0.30 0.33  CREATININE 14.20*  --  13.93*  --   --  13.98*  --  9.54*  --   --   --   TROPONINIHS 120* 141*  --   --   --   --   --   --   --   --   --    < > = values in this interval not displayed.    Estimated Creatinine Clearance: 4 mL/min (A) (by C-G formula based on SCr of 9.54 mg/dL (H)).    Assessment: 70 y.o. female with h/o Afib on Coumadin, INR subtherapeutic, for heparin.    Goal of Therapy:  Heparin level 0.3-0.7 units/mL Monitor platelets by anticoagulation protocol: Yes   Plan:  Continue Heparin at current rate   Phillis Knack, PharmD, BCPS

## 2020-09-24 NOTE — Anesthesia Procedure Notes (Signed)
Anesthesia Regional Block: Supraclavicular block   Pre-Anesthetic Checklist: ,, timeout performed, Correct Patient, Correct Site, Correct Laterality, Correct Procedure, Correct Position, site marked, Risks and benefits discussed,  Surgical consent,  Pre-op evaluation,  At surgeon's request and post-op pain management  Laterality: Left  Prep: Dura Prep       Needles:  Injection technique: Single-shot  Needle Type: Echogenic Stimulator Needle     Needle Length: 5cm  Needle Gauge: 20     Additional Needles:   Procedures:,,,, ultrasound used (permanent image in chart),,,,  Narrative:  Start time: 09/24/2020 11:40 AM End time: 09/24/2020 11:44 AM Injection made incrementally with aspirations every 5 mL.  Performed by: Personally  Anesthesiologist: Darral Dash, DO  Additional Notes: Patient identified. Risks/Benefits/Options discussed with patient including but not limited to bleeding, infection, nerve damage, failed block, incomplete pain control. Patient expressed understanding and wished to proceed. All questions were answered. Sterile technique was used throughout the entire procedure. Please see nursing notes for vital signs. Aspirated in 5cc intervals with injection for negative confirmation. Patient was given instructions on fall risk and not to get out of bed. All questions and concerns addressed with instructions to call with any issues or inadequate analgesia.

## 2020-09-24 NOTE — Interval H&P Note (Signed)
History and Physical Interval Note:  09/24/2020 11:23 AM  Alcus Dad  has presented today for surgery, with the diagnosis of ESRD.  The various methods of treatment have been discussed with the patient and family. After consideration of risks, benefits and other options for treatment, the patient has consented to  Procedure(s): LIGATION OF LEFT ARM ARTERIOVENOUS  FISTULA (Left) INSERTION OF TUNNELED DIALYSIS CATHETER (N/A) as a surgical intervention.  The patient's history has been reviewed, patient examined, no change in status, stable for surgery.  I have reviewed the patient's chart and labs.  Questions were answered to the patient's satisfaction.     Curt Jews

## 2020-09-24 NOTE — Anesthesia Procedure Notes (Signed)
Procedure Name: MAC Date/Time: 09/24/2020 12:08 PM Performed by: Imagene Riches, CRNA Pre-anesthesia Checklist: Patient identified, Emergency Drugs available, Suction available, Patient being monitored and Timeout performed Patient Re-evaluated:Patient Re-evaluated prior to induction Oxygen Delivery Method: Nasal cannula

## 2020-09-24 NOTE — Care Management Important Message (Signed)
Important Message  Patient Details  Name: Monique Henderson MRN: [id number] Date of Birth: Jun 05, 1950   Medicare Important Message Given:  Yes     Orbie Pyo 09/24/2020, 2:18 PM

## 2020-09-24 NOTE — Progress Notes (Signed)
[facility] NEPHROLOGY PROGRESS NOTE  Assessment/ Plan: Pt is a 70 y.o. 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  She required dialysis in the past and then had some recovery.  Now, presented with creatinine level 14 with volume overload.  Not responding with IV diuretics.  She now has progression to ESRD therefore initiated dialysis on 4/24.  Status post second HD yesterday with 2.3 L UF, tolerated well.  Plan for next HD tomorrow.  She will need outpatient HD arrangement.  #Dialysis Access: Left upper extremity AV fistula was created by Dr. Donzetta Matters in 04/2019.  Tried to use initially without success and had infiltration.  She developed left arm swelling and tender.  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  IR placed right EJ temporary HD catheter on 4/24.  Seen by vascular surgeon plan for ligation of AV fistula today and placement of TDC.  #Acute respiratory failure with hypoxia/shortness of breath: Due to pulmonary edema, ? CAP.  Volume management by dialysis.  On antibiotics for CAP.   #Hypertension/volume up: Continue antihypertensive medication.  Hold losartan.  UF with HD.  Pain is contributing elevated BP and currently n.p.o. for the OR.  #Anemia due to CKD: Iron saturation 5%.  Ordered IV iron and ESA.  Received PRBC.  Monitor hemoglobin.  # CKD-MBD, secondary hyperparathyroidism: Hyperphosphatemia noted.  Started calcium acetate.  #Chronic diastolic heart failure: Cardiology is following.  Subjective: Seen and examined at bedside.  Tolerated dialysis yesterday.  Still complaining of left arm pain.  Denies nausea vomiting chest pain.  OR today. Objective Vital signs in last 24  hours: Vitals:   09/23/20 1645 09/23/20 1900 09/24/20 0400 09/24/20 0748  BP: (!) 170/91 (!) 161/91 (!) 173/100 (!) 186/88  Pulse: 74 78 81 73  Resp:  13 14 13   Temp: 98.3 F (36.8 C)     TempSrc: Oral Oral    SpO2: 99% 97% 92% 99%  Weight: 45 kg     Height:       Weight change: -3.6 kg  Intake/Output Summary (Last 24 hours) at 09/24/2020 5638 Last data filed at 09/23/2020 1645 Gross per 24 hour  Intake 380 ml  Output 2300 ml  Net -1920 ml       Labs: Basic Metabolic Panel: Recent Labs  Lab 09/22/20 1303 09/23/20 0400 09/24/20 0056  NA 139 141 135  K 3.5 3.4* 3.5  CL 105 104 99  CO2 18* 24 26  GLUCOSE 121* 139* 132*  BUN 113* 65* 30*  CREATININE 13.98* 9.54* 5.73*  CALCIUM 7.2* 8.2* 8.3*  PHOS 8.2*  --   --    Liver Function Tests: Recent Labs  Lab 09/21/20 2021 09/22/20 1303  AST 32  --   ALT 27  --   ALKPHOS 114  --   BILITOT 0.8  --   PROT 6.8  --   ALBUMIN 3.0* 2.5*   No results for input(s): LIPASE, AMYLASE in the last 168 hours. No results for input(s): AMMONIA in the last 168 hours. CBC: Recent Labs  Lab 09/21/20 2021 09/22/20 0447 09/22/20 1250 09/22/20 2023 09/23/20 0400 09/24/20 0056  WBC 8.4 7.0 6.4  --  7.6 8.3  NEUTROABS 7.0  --   --   --   --   --  HGB 7.3* 6.6* 5.5* 8.5* 8.6* 8.8*  HCT 23.4* 21.0* 17.9* 25.3* 26.0* 26.4*  MCV 91.4 90.1 90.9  --  85.2 85.7  PLT 411* 372 330  --  275 267   Cardiac Enzymes: No results for input(s): CKTOTAL, CKMB, CKMBINDEX, TROPONINI in the last 168 hours. CBG: Recent Labs  Lab 09/24/20 0610  GLUCAP 114*    Iron Studies:  Recent Labs    09/22/20 0447  IRON 11*  TIBC 234*  FERRITIN 198   Studies/Results: IR Venocavagram Svc  Result Date: 09/22/2020 CLINICAL DATA:  Renal failure, progressive. Previously placed dialysis fistula could not be utilized for dialysis and a temporary hemodialysis catheter is requested. EXAM: EXAM LEFT JUGULAR VENOGRAM RIGHT IJ CATHETER PLACEMENT UNDER   FLUOROSCOPIC GUIDANCE ULTRASOUND GUIDANCE FOR VASCULAR ACCESS X 2 TECHNIQUE: The procedure, risks (including but not limited to bleeding, infection, organ damage, pneumothorax), benefits, and alternatives were explained to the patient. Questions regarding the procedure were encouraged and answered. The patient understands and consents to the procedure. Initially, a left-sided approach was selected due to the available catheters. Patency of the left IJ vein was confirmed with ultrasound with image documentation. An appropriate skin site was determined. Skin site was marked. Region was prepped using maximum barrier technique including cap and mask, sterile gown, sterile gloves, large sterile sheet, and Chlorhexidine as cutaneous antisepsis. The region was infiltrated locally with 1% lidocaine. Under real-time ultrasound guidance, the vein was accessed with a 21-gauge micropuncture needle, exchanged over a 018 guidewire for a transitional dilator, through which a 035 guidewire was advanced. This would not pass centrally to the SVC. Venogram was performed. 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 Patency of the right external jugular vein was confirmed with ultrasound with image documentation. An appropriate skin site was determined. Skin site was marked. Region was prepped using maximum barrier technique including cap and mask, sterile gown, sterile gloves, large sterile sheet, and Chlorhexidine as cutaneous antisepsis. The region was infiltrated locally with 1% lidocaine. Under real-time ultrasound guidance, the vein was accessed with a 21-gauge micropuncture needle, exchanged over a 018 guidewire for a transitional dilator, through which a 035 guidewire was advanced, allowing passage of vascular dilator which allowed advancement of a 20 cm Trialysis catheter. This was positioned with the tip mid right  atrium. Spot chest radiograph shows good positioning and no pneumothorax. Catheter was flushed and sutured externally with 0-Prolene sutures. Patient tolerated the procedure well. FLUOROSCOPY TIME:  54 seconds; 76.1 mGy COMPLICATIONS: COMPLICATIONS none IMPRESSION: 1. 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 2. Technically successful right EJ Trialysis catheter placement. Electronically Signed   By: Lucrezia Europe M.D.   On: 09/22/2020 12:48   IR Fluoro Guide CV Line Right  Result Date: 09/22/2020 CLINICAL DATA:  Renal failure, progressive. Previously placed dialysis fistula could not be utilized for dialysis and a temporary hemodialysis catheter is requested. EXAM: EXAM LEFT JUGULAR VENOGRAM RIGHT IJ CATHETER PLACEMENT UNDER  FLUOROSCOPIC GUIDANCE ULTRASOUND GUIDANCE FOR VASCULAR ACCESS X 2 TECHNIQUE: The procedure, risks (including but not limited to bleeding, infection, organ damage, pneumothorax), benefits, and alternatives were explained to the patient. Questions regarding the procedure were encouraged and answered. The patient understands and consents to the procedure. Initially, a left-sided approach was selected due to the available catheters. Patency of the left IJ vein was confirmed with ultrasound  with image documentation. An appropriate skin site was determined. Skin site was marked. Region was prepped using maximum barrier technique including cap and mask, sterile gown, sterile gloves, large sterile sheet, and Chlorhexidine as cutaneous antisepsis. The region was infiltrated locally with 1% lidocaine. Under real-time ultrasound guidance, the vein was accessed with a 21-gauge micropuncture needle, exchanged over a 018 guidewire for a transitional dilator, through which a 035 guidewire was advanced. This would not pass centrally to the SVC. Venogram was performed. This demonstrated long segment occlusion of the left  innominate vein with immature thyrocervical collateral network to the right external jugular system. Patency of the right external jugular vein was confirmed with ultrasound with image documentation. An appropriate skin site was determined. Skin site was marked. Region was prepped using maximum barrier technique including cap and mask, sterile gown, sterile gloves, large sterile sheet, and Chlorhexidine as cutaneous antisepsis. The region was infiltrated locally with 1% lidocaine. Under real-time ultrasound guidance, the vein was accessed with a 21-gauge micropuncture needle, exchanged over a 018 guidewire for a transitional dilator, through which a 035 guidewire was advanced, allowing passage of vascular dilator which allowed advancement of a 20 cm Trialysis catheter. This was positioned with the tip mid right atrium. Spot chest radiograph shows good positioning and no pneumothorax. Catheter was flushed and sutured externally with 0-Prolene sutures. Patient tolerated the procedure well. FLUOROSCOPY TIME:  54 seconds; 11.9 mGy COMPLICATIONS: COMPLICATIONS none IMPRESSION: 1. 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 2. Technically successful right EJ Trialysis catheter placement. Electronically Signed   By: Lucrezia Europe M.D.   On: 09/22/2020 12:48   IR US Guide Vasc Access Left  Result Date: 09/22/2020 CLINICAL DATA:  Renal failure, progressive. Previously placed dialysis fistula could not be utilized for dialysis and a temporary hemodialysis catheter is requested. EXAM: EXAM LEFT JUGULAR VENOGRAM RIGHT IJ CATHETER PLACEMENT UNDER  FLUOROSCOPIC GUIDANCE ULTRASOUND GUIDANCE FOR VASCULAR ACCESS X 2 TECHNIQUE: The procedure, risks (including but not limited to bleeding, infection, organ damage, pneumothorax), benefits, and alternatives were explained to the patient. Questions regarding the procedure were encouraged and answered.  The patient understands and consents to the procedure. Initially, a left-sided approach was selected due to the available catheters. Patency of the left IJ vein was confirmed with ultrasound with image documentation. An appropriate skin site was determined. Skin site was marked. Region was prepped using maximum barrier technique including cap and mask, sterile gown, sterile gloves, large sterile sheet, and Chlorhexidine as cutaneous antisepsis. The region was infiltrated locally with 1% lidocaine. Under real-time ultrasound guidance, the vein was accessed with a 21-gauge micropuncture needle, exchanged over a 018 guidewire for a transitional dilator, through which a 035 guidewire was advanced. This would not pass centrally to the SVC. Venogram was performed. 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 Patency of the right external jugular vein was confirmed with ultrasound with image documentation. An appropriate skin site was determined. Skin site was marked. Region was prepped using maximum barrier technique including cap and mask, sterile gown, sterile gloves, large sterile sheet, and Chlorhexidine as cutaneous antisepsis. The region was infiltrated locally with 1% lidocaine. Under real-time ultrasound guidance, the vein was accessed with a 21-gauge micropuncture needle, exchanged over a 018 guidewire for a transitional dilator, through which a 035 guidewire was advanced, allowing passage of vascular dilator which allowed  advancement of a 20 cm Trialysis catheter. This was positioned with the tip mid right atrium. Spot chest radiograph shows good positioning and no pneumothorax. Catheter was flushed and sutured externally with 0-Prolene sutures. Patient tolerated the procedure well. FLUOROSCOPY TIME:  54 seconds; 16.6 mGy COMPLICATIONS: COMPLICATIONS none IMPRESSION: 1. Long segment occlusion of the left  innominate vein with mature thyrocervical collateral network draining into the right EJ vein. 2. Technically successful right EJ Trialysis catheter placement. Electronically Signed   By: Lucrezia Europe M.D.   On: 09/22/2020 12:48   IR US Guide Vasc Access Right  Result Date: 09/22/2020 CLINICAL DATA:  Renal failure, progressive. Previously placed dialysis fistula could not be utilized for dialysis and a temporary hemodialysis catheter is requested. EXAM: EXAM LEFT JUGULAR VENOGRAM RIGHT IJ CATHETER PLACEMENT UNDER  FLUOROSCOPIC GUIDANCE ULTRASOUND GUIDANCE FOR VASCULAR ACCESS X 2 TECHNIQUE: The procedure, risks (including but not limited to bleeding, infection, organ damage, pneumothorax), benefits, and alternatives were explained to the patient. Questions regarding the procedure were encouraged and answered. The patient understands and consents to the procedure. Initially, a left-sided approach was selected due to the available catheters. Patency of the left IJ vein was confirmed with ultrasound with image documentation. An appropriate skin site was determined. Skin site was marked. Region was prepped using maximum barrier technique including cap and mask, sterile gown, sterile gloves, large sterile sheet, and Chlorhexidine as cutaneous antisepsis. The region was infiltrated locally with 1% lidocaine. Under real-time ultrasound guidance, the vein was accessed with a 21-gauge micropuncture needle, exchanged over a 018 guidewire for a transitional dilator, through which a 035 guidewire was advanced. This would not pass centrally to the SVC. Venogram was performed. 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 Patency of the right external jugular vein was confirmed with ultrasound with image documentation. An appropriate skin site was determined. Skin site was marked. Region was prepped using maximum barrier  technique including cap and mask, sterile gown, sterile gloves, large sterile sheet, and Chlorhexidine as cutaneous antisepsis. The region was infiltrated locally with 1% lidocaine. Under real-time ultrasound guidance, the vein was accessed with a 21-gauge micropuncture needle, exchanged over a 018 guidewire for a transitional dilator, through which a 035 guidewire was advanced, allowing passage of vascular dilator which allowed advancement of a 20 cm Trialysis catheter. This was positioned with the tip mid right atrium. Spot chest radiograph shows good positioning and no pneumothorax. Catheter was flushed and sutured externally with 0-Prolene sutures. Patient tolerated the procedure well. FLUOROSCOPY TIME:  54 seconds; 06.3 mGy COMPLICATIONS: COMPLICATIONS none IMPRESSION: 1. 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 2. Technically successful right EJ Trialysis catheter placement. Electronically Signed   By: Lucrezia Europe M.D.   On: 09/22/2020 12:48   ECHOCARDIOGRAM COMPLETE  Result Date: 09/22/2020    ECHOCARDIOGRAM REPORT   Patient Name:   Monique Henderson Date of Exam: 09/22/2020 Medical Rec #:  [id number]          Height:       63.0 in Accession #:    [id number]         Weight:       108.0 lb Date of Birth:  [birth year]           BSA:          1.488 m Patient Age:  70 years           BP:           125/90 mmHg Patient Gender: F                  HR:           96 bpm. Exam Location:  Inpatient Procedure: 2D Echo, Cardiac Doppler and Color Doppler Indications:    R07.9* Chest pain, unspecified  History:        Patient has prior history of Echocardiogram examinations, most                 recent 08/28/2019. Stroke, Arrythmias:Atrial Fibrillation,                 Signs/Symptoms:Shortness of Breath and Chest Pain; Risk                 Factors:Hypertension.  Sonographer:    Bernadene Person RDCS Referring Phys: [id number] ROBIN Ovilla  1. Apical hypokinesis with overall normal LV function.  2. Left ventricular ejection fraction, by estimation, is 55 to 60%. The left ventricle has normal function. The left ventricle demonstrates regional wall motion abnormalities (see scoring diagram/findings for description). There is moderate left ventricular hypertrophy. Left ventricular diastolic parameters are consistent with Grade II diastolic dysfunction (pseudonormalization).  3. Right ventricular systolic function is normal. The right ventricular size is normal. There is mildly elevated pulmonary artery systolic pressure.  4. Left atrial size was severely dilated.  5. Right atrial size was mildly dilated.  6. The mitral valve is normal in structure. Mild mitral valve regurgitation. No evidence of mitral stenosis.  7. The aortic valve is tricuspid. Aortic valve regurgitation is not visualized. No aortic stenosis is present.  8. The inferior vena cava is normal in size with greater than 50% respiratory variability, suggesting right atrial pressure of 3 mmHg. FINDINGS  Left Ventricle: Left ventricular ejection fraction, by estimation, is 55 to 60%. The left ventricle has normal function. The left ventricle demonstrates regional wall motion abnormalities. The left ventricular internal cavity size was normal in size. There is moderate left ventricular hypertrophy. Left ventricular diastolic parameters are consistent with Grade II diastolic dysfunction (pseudonormalization). Right Ventricle: The right ventricular size is normal. Right ventricular systolic function is normal. There is mildly elevated pulmonary artery systolic pressure. The tricuspid regurgitant velocity is 3.24 m/s, and with an assumed right atrial pressure of 3 mmHg, the estimated right ventricular systolic pressure is 92.1 mmHg. Left Atrium: Left atrial size was severely dilated. Right Atrium: Right atrial size was mildly dilated. Pericardium: There is no evidence of pericardial  effusion. Mitral Valve: The mitral valve is normal in structure. Mild mitral valve regurgitation. No evidence of mitral valve stenosis. Tricuspid Valve: The tricuspid valve is normal in structure. Tricuspid valve regurgitation is mild . No evidence of tricuspid stenosis. Aortic Valve: The aortic valve is tricuspid. Aortic valve regurgitation is not visualized. No aortic stenosis is present. Pulmonic Valve: The pulmonic valve was normal in structure. Pulmonic valve regurgitation is not visualized. No evidence of pulmonic stenosis. Aorta: The aortic root is normal in size and structure. Venous: The inferior vena cava is normal in size with greater than 50% respiratory variability, suggesting right atrial pressure of 3 mmHg. IAS/Shunts: The interatrial septum is aneurysmal. No atrial level shunt detected by color flow Doppler. Additional Comments: Apical hypokinesis with overall normal LV function.  LEFT VENTRICLE PLAX 2D LVIDd:  3.60 cm LVIDs:         2.80 cm LV PW:         1.50 cm LV IVS:        1.10 cm LVOT diam:     1.80 cm LV SV:         63 LV SV Index:   42 LVOT Area:     2.54 cm  LV Volumes (MOD) LV vol d, MOD A2C: 112.0 ml LV vol d, MOD A4C: 95.9 ml LV vol s, MOD A2C: 49.9 ml LV vol s, MOD A4C: 48.0 ml LV SV MOD A2C:     62.1 ml LV SV MOD A4C:     95.9 ml LV SV MOD BP:      53.4 ml RIGHT VENTRICLE TAPSE (M-mode): 1.7 cm LEFT ATRIUM             Index       RIGHT ATRIUM           Index LA diam:        3.20 cm 2.15 cm/m  RA Area:     18.10 cm LA Vol (A2C):   82.0 ml 55.10 ml/m RA Volume:   46.30 ml  31.11 ml/m LA Vol (A4C):   75.0 ml 50.39 ml/m LA Biplane Vol: 82.4 ml 55.37 ml/m  AORTIC VALVE LVOT Vmax:   149.50 cm/s LVOT Vmean:  100.150 cm/s LVOT VTI:    0.246 m  AORTA Ao Root diam: 2.60 cm Ao Asc diam:  2.80 cm MR PISA:        0.57 cm TRICUSPID VALVE MR PISA Radius: 0.30 cm  TR Peak grad:   42.0 mmHg                          TR Vmax:        324.00 cm/s                           SHUNTS                           Systemic VTI:  0.25 m                          Systemic Diam: 1.80 cm Kirk Ruths MD Electronically signed by Kirk Ruths MD Signature Date/Time: 09/22/2020/11:02:07 AM    Final     Medications: Infusions: . sodium chloride    . cefTRIAXone (ROCEPHIN)  IV 2 g (09/23/20 2229)  . ferric gluconate (FERRLECIT/NULECIT) IV 250 mg (09/23/20 1513)  . heparin Stopped (09/24/20 1610)    Scheduled Medications: . sodium chloride   Intravenous Once  . amLODipine  10 mg Oral Daily  . calcium acetate  667 mg Oral TID WC  . Chlorhexidine Gluconate Cloth  6 each Topical Q0600  . Chlorhexidine Gluconate Cloth  6 each Topical Q0600  . darbepoetin (ARANESP) injection - DIALYSIS  100 mcg Intravenous Q Mon-HD  . dextromethorphan-guaiFENesin  1 tablet Oral BID  . doxycycline  100 mg Oral Q12H  . furosemide  80 mg Intravenous Q12H  . hydrALAZINE  25 mg Oral BID  . metoprolol succinate  75 mg Oral Daily  . multivitamin  1 tablet Oral QHS  . nitroGLYCERIN  0.2 mg Transdermal Daily  . rosuvastatin  10 mg Oral Daily    have reviewed scheduled and  prn medications.  Physical Exam: General:NAD, comfortable Heart:RRR, s1s2 nl Lungs: Clear bilateral, no wheezing Abdomen:soft, Non-tender, non-distended Extremities:No edema Dialysis Access: Right is a temporary HD catheter.  Left upper extremity is swollen and tender.  Monique Henderson 09/24/2020,8:22 AM  LOS: 3 days

## 2020-09-24 NOTE — Anesthesia Preprocedure Evaluation (Addendum)
Anesthesia Evaluation  Patient identified by MRN, date of birth, ID band Patient awake    Reviewed: Allergy & Precautions, NPO status , Patient's Chart, lab work & pertinent test results, reviewed documented beta blocker date and time   Airway Mallampati: II  TM Distance: >3 FB Neck ROM: Full    Dental  (+) Edentulous Upper, Edentulous Lower   Pulmonary COPD,  COPD inhaler, former smoker,    Pulmonary exam normal        Cardiovascular hypertension, Pt. on medications and Pt. on home beta blockers + Peripheral Vascular Disease (s/p thoracic aneurysm repair 2020) and +CHF  + dysrhythmias (on Coumadin) Atrial Fibrillation  Rhythm:Regular Rate:Normal     Neuro/Psych CVA negative psych ROS   GI/Hepatic GERD  ,(+) Hepatitis -, C  Endo/Other  negative endocrine ROS  Renal/GU CRFRenal disease  negative genitourinary   Musculoskeletal  (+) Arthritis , Osteoarthritis,    Abdominal (+)  Abdomen: soft.    Peds  Hematology  (+) anemia ,   Anesthesia Other Findings   Reproductive/Obstetrics                            Anesthesia Physical Anesthesia Plan  ASA: III  Anesthesia Plan: MAC and Regional   Post-op Pain Management:  Regional for Post-op pain   Induction: Intravenous  PONV Risk Score and Plan: 2 and Ondansetron, Dexamethasone, Midazolam and Treatment may vary due to age or medical condition  Airway Management Planned: Simple Face Mask, Natural Airway and Nasal Cannula  Additional Equipment: None  Intra-op Plan:   Post-operative Plan:   Informed Consent: I have reviewed the patients History and Physical, chart, labs and discussed the procedure including the risks, benefits and alternatives for the proposed anesthesia with the patient or authorized representative who has indicated his/her understanding and acceptance.     Dental advisory given  Plan Discussed with:  CRNA  Anesthesia Plan Comments: (Lab Results      Component                Value               Date                      WBC                      8.3                 09/24/2020                HGB                      8.8 (L)             09/24/2020                HCT                      26.4 (L)            09/24/2020                MCV                      85.7                09/24/2020  PLT                      267                 09/24/2020           Lab Results      Component                Value               Date                      NA                       135                 09/24/2020                K                        3.5                 09/24/2020                CO2                      26                  09/24/2020                GLUCOSE                  132 (H)             09/24/2020                BUN                      30 (H)              09/24/2020                CREATININE               5.73 (H)            09/24/2020                CALCIUM                  8.3 (L)             09/24/2020                GFRNONAA                 8 (L)               09/24/2020                GFRAA                    8 (L)               05/13/2020          )        Anesthesia Quick Evaluation

## 2020-09-24 NOTE — Transfer of Care (Signed)
Immediate Anesthesia Transfer of Care Note  Patient: Monique Henderson  Procedure(s) Performed: LIGATION OF LEFT ARM ARTERIOVENOUS  FISTULA (Left Arm Upper) INSERTION OF TUNNELED DIALYSIS CATHETER (Right Chest)  Patient Location: PACU  Anesthesia Type:MAC combined with regional for post-op pain  Level of Consciousness: drowsy  Airway & Oxygen Therapy: Patient Spontanous Breathing and Patient connected to nasal cannula oxygen  Post-op Assessment: Report given to RN and Post -op Vital signs reviewed and stable  Post vital signs: Reviewed and stable  Last Vitals:  Vitals Value Taken Time  BP 148/75 09/24/20 1310  Temp    Pulse 61 09/24/20 1313  Resp 17 09/24/20 1313  SpO2 100 % 09/24/20 1313  Vitals shown include unvalidated device data.  Last Pain:  Vitals:   09/24/20 0930  TempSrc:   PainSc: 4          Complications: No complications documented.

## 2020-09-24 NOTE — Progress Notes (Signed)
[facility] for Heparin Indication: atrial fibrillation  Allergies  Allergen Reactions  . Shrimp [Shellfish Allergy] Shortness Of Breath  . Bactroban [Mupirocin] Other (See Comments)    "Sores in nose"  . Vicodin [Hydrocodone-Acetaminophen] Itching and Nausea And Vomiting    This is patient's home medication  . Eliquis [Apixaban] Rash  . Lisinopril Cough  . Tylenol [Acetaminophen] Itching    Patient Measurements: Height: _0  (160 cm) Weight: 45 kg (99 lb 3.3 oz) IBW/kg (Calculated) : 52.4  Vital Signs: BP: 186/88 (04/26 0748) Pulse Rate: 73 (04/26 0748)  Labs: Recent Labs    09/21/20 2021 09/21/20 2300 09/22/20 0447 09/22/20 1250 09/22/20 1303 09/22/20 2023 09/23/20 0400 09/23/20 0430 09/23/20 1454 09/23/20 2221 09/24/20 0056  HGB 7.3*  --    < > 5.5*  --  8.5* 8.6*  --   --   --  8.8*  HCT 23.4*  --    < > 17.9*  --  25.3* 26.0*  --   --   --  26.4*  PLT 411*  --    < > 330  --   --  275  --   --   --  267  APTT 40*  --   --   --   --   --   --   --   --   --   --   LABPROT 14.4  --   --   --   --   --   --   --   --   --   --   INR 1.1  --   --   --   --   --   --   --   --   --   --   HEPARINUNFRC  --   --    < >  --   --  0.19*  --    < > 0.30 0.33 0.30  CREATININE 14.20*  --    < >  --  13.98*  --  9.54*  --   --   --  5.73*  TROPONINIHS 120* 141*  --   --   --   --   --   --   --   --   --    < > = values in this interval not displayed.    Estimated Creatinine Clearance: 6.7 mL/min (A) (by C-G formula based on SCr of 5.73 mg/dL (H)).   Medical History: Past Medical History:  Diagnosis Date  . AF (paroxysmal atrial fibrillation) ([place]) 05/29/2019  . Aortic atherosclerosis ([place]) 07/05/2019  . Aortic dissection ([place]) 04/04/2019  . Atelectasis 2002   Bilateral  . Atrial fibrillation with RVR ([place]) 05/18/2019  . Atypical chest pain 07/05/2019  . Bone spur 2008   Right calcaneal foot spur  . Breast cancer ([place]) 2004    Ductal carcinoma in situ of the left breast; S/P left partial mastectomy 02/26/2003; S/P re-excision of cranial and lateral margins11/18/2004.radiation  . Breast cancer ([place]) 09/21/2012   right breast/ last radiation treatment 03/22/2013  . Cerebral thrombosis with cerebral infarction 05/22/2019  . Chronic kidney disease, stage IV (severe) ([place]) 10/10/2007  . Chronic low back pain 06/22/2016  . Chronic obstructive lung disease ([place]) 01/16/2017  . CKD (chronic kidney disease), stage IV ([place]) 07/19/2017  . 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 Autumn Messing; S/P re-excision of superior  and inferior margins 10/27/2012.   . Dissection of aorta ([place]) 04/03/2019  . Elevated troponin 08/28/2019  . ESRD on dialysis ([place]) 05/29/2019  . Essential hypertension 09/16/2006  . GERD 09/16/2006  . GERD (gastroesophageal reflux disease)   . H/O: stroke 05/29/2019  . 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 Hot flashes   . Hx of radiation therapy 2005   left breast  . Hx of radiation therapy 01/11/13- 03/22/13   right breast 5760 cGy 30 sessions  . Hyperkalemia 08/28/2019  . Hypertension   . Insomnia 03/14/2015  . Low back pain   . Lumbar spinal stenosis    S/P lumbar decompressive laminectomy, fusion and plating for lumbar spinal stensosis  . Malnutrition of moderate degree 05/19/2019  . Non compliance with medical treatment 12/04/2017  . Normocytic anemia    With thrombocytosis  . On Coumadin for atrial fibrillation ([place]) 10/03/2019  . Osteoarthritis   . Paroxysmal SVT (supraventricular tachycardia) ([place]) 08/27/2019  . Personal history of radiation therapy   . Pleural effusion 05/18/2019  . Positive D dimer 08/28/2019  . Right ureteral stone 2002  . S/P aortic aneurysm repair 04/07/2019  . S/P lumbar spinal fusion 01/18/2014   S/P lumbar decompressive laminectomy, fusion, and plating for lumbar spinal stenosis on 05/27/2009 by  Dr. Eustace Moore.  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 Eustace Moore.    . Shortness of breath    from pain  . SIRS (systemic inflammatory response syndrome) ([place]) 08/27/2019  . SVT (supraventricular tachycardia) ([place]) 08/28/2019  . Tobacco use disorder 04/19/2009  . Uterine fibroid   . Wears dentures    top    Medications:  No current facility-administered medications on file prior to encounter.   Current Outpatient Medications on File Prior to Encounter  Medication Sig Dispense Refill  . albuterol (VENTOLIN HFA) 108 (90 Base) MCG/ACT inhaler Inhale 2 puffs into the lungs every 6 (six) hours as needed for wheezing or shortness of breath. 8.5 g 0  . amLODipine (NORVASC) 10 MG tablet TAKE 1 TABLET (10 MG TOTAL) BY MOUTH DAILY. 90 tablet 0  . amoxicillin (AMOXIL) 500 MG capsule Take 1,000 mg by mouth 2 (two) times daily. For 7 days    . baclofen (LIORESAL) 10 MG tablet Take 10 mg by mouth 2 (two) times daily as needed for muscle spasms.    . buprenorphine (BUTRANS) 10 MCG/HR PTWK Place 1 patch onto the skin once a week.    . cloNIDine (CATAPRES - DOSED IN MG/24 HR) 0.3 mg/24hr patch Place 1 patch (0.3 mg total) onto the skin once a week. 4 patch 2  . cloNIDine (CATAPRES) 0.1 MG tablet Take 0.1 mg by mouth 2 (two) times daily as needed.    . cyclobenzaprine (FLEXERIL) 5 MG tablet Take 5 mg by mouth 3 (three) times daily as needed for muscle spasms.    . furosemide (LASIX) 20 MG tablet Take 1 tablet (20 mg total) by mouth daily. 60 tablet 5  . hydrALAZINE (APRESOLINE) 25 MG tablet Take 1 tablet (25 mg total) by mouth 2 (two) times daily.    Marland Kitchen loperamide (IMODIUM) 2 MG capsule Take 2 mg by mouth 2 (two) times daily as needed for diarrhea or loose stools.    Marland Kitchen losartan (COZAAR) 100 MG  tablet Take  100 mg by mouth at bedtime.    . metoprolol succinate (TOPROL-XL) 50 MG 24 hr tablet Take 1 tablet (50 mg total) by mouth daily. Take with or immediately following a meal. 90 tablet 1  . metoprolol tartrate (LOPRESSOR) 50 MG tablet Take 50 mg by mouth 2 (two) times daily.    . mirtazapine (REMERON) 7.5 MG tablet Take 1 tablet (7.5 mg total) by mouth at bedtime. 90 tablet 3  . NARCAN 4 MG/0.1ML LIQD nasal spray kit Place 1 spray into the nose as needed for opioid reversal. If no response within 3 minutes then repeat once in the other nostril.  Call 911.    . ondansetron (ZOFRAN) 4 MG tablet Take 1 tablet (4 mg total) by mouth every 8 (eight) hours as needed for nausea or vomiting. 20 tablet 3  . oxyCODONE-acetaminophen (PERCOCET) 10-325 MG tablet Take 1 tablet by mouth 3 (three) times daily as needed for severe pain.    . predniSONE (DELTASONE) 20 MG tablet Take 20 mg by mouth 2 (two) times daily. For 5 days    . rosuvastatin (CRESTOR) 10 MG tablet TAKE 1 TABLET (10 MG TOTAL) BY MOUTH DAILY. 30 tablet 0  . warfarin (COUMADIN) 6 MG tablet TAKE 1 TABLET (6 MG TOTAL) BY MOUTH 4 (FOUR) TIMES A WEEK. 30 tablet 0  . warfarin (COUMADIN) 7.5 MG tablet Take 1 tablet (7.5 mg total) by mouth 3 (three) times a week. 90 tablet 0  . [DISCONTINUED] oxyCODONE-acetaminophen (PERCOCET/ROXICET) 5-325 MG tablet Take 1 tablet by mouth 3 (three) times daily as needed for severe pain (pain.).       Assessment: 7 yoF with hx AFib on warfarin PTA held for heparin while procedures needed.  Heparin off this morning with need for AV fistula ligation.  Goal of Therapy:  Heparin level 0.3-0.7 units/mL Monitor platelets by anticoagulation protocol: Yes   Plan:  Heparin held - will follow-up resuming post/op   Arrie Senate, PharmD, BCPS, [facility] Clinical Pharmacist [phone] Please check AMION for all [facility] Pharmacy numbers 09/24/2020

## 2020-09-24 NOTE — Plan of Care (Signed)
  Problem: Education: Goal: Knowledge of General Education information will improve Description: Including pain rating scale, medication(s)/side effects and non-pharmacologic comfort measures Outcome: Progressing   Problem: Health Behavior/Discharge Planning: Goal: Ability to manage health-related needs will improve Outcome: Progressing   Problem: Clinical Measurements: Goal: Will remain free from infection Outcome: Progressing Goal: Diagnostic test results will improve Outcome: Progressing   Problem: Nutrition: Goal: Adequate nutrition will be maintained Outcome: Progressing   Problem: Pain Managment: Goal: General experience of comfort will improve Outcome: Progressing

## 2020-09-24 NOTE — Progress Notes (Signed)
Patient left unit to OR this a.m., about 1015. Patient's ring remained on her finger. This ring was unable to be removed from patient's finger due to swelling.

## 2020-09-24 NOTE — Progress Notes (Signed)
PROGRESS NOTE    Monique Henderson  HYW:[id number] DOB: Jun 02, 1950 DOA: 09/21/2020 PCP: Maximiano Coss, NP    Brief Narrative:  70 year old female with history of chronic kidney disease stage V not on dialysis, chronic diastolic congestive heart failure, paroxysmal A. fib on Coumadin, thoracic aortic aneurysm repair 2020, hypertension, history of stroke presented to the hospital with cough with yellow sputum, shortness of breath, orthopnea and subjective fever.  In the emergency room blood pressure 190s/110, 87% on room air.  EKG with sinus tachycardia.  Chest x-ray with cardiomegaly and bilateral lower lung airspace opacities.  BUN 114/creatinine 14.2.  Initial hemoglobin 7.3.  She was admitted on oxygen, treated with antibiotics, cardiology and nephrology consulted.  Hemoglobin dropped to 6.6.  Started on hemodialysis.  Patient was started on hemodialysis with her left arm AV fistula that infiltrated giving severe pain.  A temporary HD catheter was placed on the right IJ on 4/24 and received first dialysis.   Assessment & Plan:   Principal Problem:   Acute renal failure superimposed on chronic kidney disease (HCC) Active Problems:   Chronic obstructive lung disease (HCC)   History of CVA (cerebrovascular accident)   AF (paroxysmal atrial fibrillation) (HCC)   Chest pain   Troponin level elevated   Acute respiratory failure with hypoxia ([place])   Community acquired pneumonia   Uremia   Metabolic acidosis   Hypertensive urgency  Acute hypoxemic respiratory failure secondary to volume overload in the setting of renal failure, diastolic dysfunction: Patient needing fluid removal.   Patient received 2 subsequent dialysis with improvement of oxygenation.  Currently on room air.    Suspected community-acquired pneumonia: WBC normal.  Procalcitonin 5.2.  Was started on broad-spectrum antibiotics.  Will treat with doxycycline for 5 days. Start mobility, chest physiotherapy, respiratory  therapy and incentive spirometry and flutter valve today.  It is difficult to participate on chest physiotherapy with severe left arm pain but she will try.  Acute kidney injury on CKD stage V: With fluid overload.  Acute on chronic anemia of chronic kidney disease. Temporary HD catheter right IJ, received dialysis 4/24, 4/25.  Will need outpatient dialysis and permanent access.  Followed by nephrology. Hemoglobin 6.6-2 unit PRBC-appropriately responded.  Continue to monitor.  Normal folic acid and S85.  May benefit with IV iron. Infiltrated left upper extremity fistula with severe pain, currently no evidence of compartment syndrome.  Followed by vascular and plan for exploration today in the OR. Possible permacath placement today.  Paroxysmal A. fib: Currently sinus rhythm.  Cardiology recommended symptomatic treatment.  On Toprol-XL 75 mg daily.  Coumadin redosed.  Remains on heparin infusion peri operative.  Hypertensive urgency: Controlled with resumption of Norvasc, metoprolol, hydralazine.  History of stroke: No new deficits.  On statin and anticoagulation.   DVT prophylaxis: Heparin infusion.   Code Status: Full code Family Communication: None at the bedside Disposition Plan: Status is: Inpatient  Remains inpatient appropriate because:Persistent severe electrolyte disturbances and IV treatments appropriate due to intensity of illness or inability to take PO   Dispo: The patient is from: Home              Anticipated d/c is to: Home              Patient currently is not medically stable to d/c.   Difficult to place patient No         Consultants:   Cardiology  Nephrology  Vascular surgery  Procedures:   HD catheter placement 4/24.  Hemodialysis 4/24, 4/25  Antimicrobials:  Anti-infectives (From admission, onward)   Start     Dose/Rate Route Frequency Ordered Stop   09/22/20 2230  azithromycin (ZITHROMAX) 500 mg in sodium chloride 0.9 % 250 mL IVPB  Status:   Discontinued        500 mg 250 mL/hr over 60 Minutes Intravenous Every 24 hours 09/21/20 2313 09/22/20 1211   09/22/20 2200  [MAR Hold]  cefTRIAXone (ROCEPHIN) 2 g in sodium chloride 0.9 % 100 mL IVPB        (MAR Hold since Tue 09/24/2020 at 1037.Hold Reason: Transfer to a Procedural area.)   2 g 200 mL/hr over 30 Minutes Intravenous Every 24 hours 09/21/20 2313 09/26/20 2159   09/22/20 1300  [MAR Hold]  doxycycline (VIBRA-TABS) tablet 100 mg        (MAR Hold since Tue 09/24/2020 at 1037.Hold Reason: Transfer to a Procedural area.)   100 mg Oral Every 12 hours 09/22/20 1211 09/25/20 2359   09/21/20 2215  cefTRIAXone (ROCEPHIN) 1 g in sodium chloride 0.9 % 100 mL IVPB        1 g 200 mL/hr over 30 Minutes Intravenous  Once 09/21/20 2211 09/21/20 2352   09/21/20 2215  azithromycin (ZITHROMAX) 500 mg in sodium chloride 0.9 % 250 mL IVPB        500 mg 250 mL/hr over 60 Minutes Intravenous  Once 09/21/20 2211 09/22/20 0020         Subjective: I examined patient early morning rounds.  She was breathing better.  Patient herself stated that she has less congestion and cough today.  Left arm is still hurting but less than before.  Objective: Vitals:   09/23/20 1645 09/23/20 1900 09/24/20 0400 09/24/20 0748  BP: (!) 170/91 (!) 161/91 (!) 173/100 (!) 186/88  Pulse: 74 78 81 73  Resp:  13 14 13   Temp: 98.3 F (36.8 C)     TempSrc: Oral Oral    SpO2: 99% 97% 92% 99%  Weight: 45 kg     Height:        Intake/Output Summary (Last 24 hours) at 09/24/2020 1040 Last data filed at 09/23/2020 1645 Gross per 24 hour  Intake 380 ml  Output 2300 ml  Net -1920 ml   Filed Weights   09/22/20 1245 09/23/20 1334 09/23/20 1645  Weight: 50.9 kg 47.3 kg 45 kg    Examination:  General exam: Appears comfortable.  Chronically sick looking.  Room air.  Respiratory system: Mostly conducted airway sounds.  No added sounds. Cardiovascular system: S1 & S2 heard, RRR.  No edema. Gastrointestinal system:  Abdomen is nondistended, soft and nontender. No organomegaly or masses felt. Normal bowel sounds heard. Central nervous system: Alert and oriented. No focal neurological deficits. Extremities: Edematous left arm, tender.  Distal neurovascular status intact.      Data Reviewed: I have personally reviewed following labs and imaging studies  CBC: Recent Labs  Lab 09/21/20 2021 09/22/20 0447 09/22/20 1250 09/22/20 2023 09/23/20 0400 09/24/20 0056  WBC 8.4 7.0 6.4  --  7.6 8.3  NEUTROABS 7.0  --   --   --   --   --   HGB 7.3* 6.6* 5.5* 8.5* 8.6* 8.8*  HCT 23.4* 21.0* 17.9* 25.3* 26.0* 26.4*  MCV 91.4 90.1 90.9  --  85.2 85.7  PLT 411* 372 330  --  275 938   Basic Metabolic Panel: Recent Labs  Lab 09/21/20 2021 09/22/20 0447 09/22/20 1303 09/23/20 0400 09/24/20 0056  NA 141 142 139 141 135  K 4.1 3.6 3.5 3.4* 3.5  CL 105 107 105 104 99  CO2 17* 16* 18* 24 26  GLUCOSE 101* 111* 121* 139* 132*  BUN 114* 113* 113* 65* 30*  CREATININE 14.20* 13.93* 13.98* 9.54* 5.73*  CALCIUM 7.6* 7.4* 7.2* 8.2* 8.3*  PHOS  --   --  8.2*  --   --    GFR: Estimated Creatinine Clearance: 6.7 mL/min (A) (by C-G formula based on SCr of 5.73 mg/dL (H)). Liver Function Tests: Recent Labs  Lab 09/21/20 2021 09/22/20 1303  AST 32  --   ALT 27  --   ALKPHOS 114  --   BILITOT 0.8  --   PROT 6.8  --   ALBUMIN 3.0* 2.5*   No results for input(s): LIPASE, AMYLASE in the last 168 hours. No results for input(s): AMMONIA in the last 168 hours. Coagulation Profile: Recent Labs  Lab 09/21/20 2021  INR 1.1   Cardiac Enzymes: No results for input(s): CKTOTAL, CKMB, CKMBINDEX, TROPONINI in the last 168 hours. BNP (last 3 results) No results for input(s): PROBNP in the last 8760 hours. HbA1C: No results for input(s): HGBA1C in the last 72 hours. CBG: Recent Labs  Lab 09/24/20 0610 09/24/20 1026  GLUCAP 114* 108*   Lipid Profile: No results for input(s): CHOL, HDL, LDLCALC, TRIG, CHOLHDL,  LDLDIRECT in the last 72 hours. Thyroid Function Tests: No results for input(s): TSH, T4TOTAL, FREET4, T3FREE, THYROIDAB in the last 72 hours. Anemia Panel: Recent Labs    09/22/20 0447  VITAMINB12 448  FOLATE 16.2  FERRITIN 198  TIBC 234*  IRON 11*  RETICCTPCT 1.2   Sepsis Labs: Recent Labs  Lab 09/21/20 2021 09/21/20 2300 09/22/20 0447 09/23/20 0400  PROCALCITON  --  5.12 5.24 4.26  LATICACIDVEN 1.0  --   --   --     Recent Results (from the past 240 hour(s))  Blood culture (routine single)     Status: None (Preliminary result)   Collection Time: 09/21/20  8:19 PM   Specimen: BLOOD RIGHT ARM  Result Value Ref Range Status   Specimen Description BLOOD RIGHT ARM  Final   Special Requests   Final    BOTTLES DRAWN AEROBIC AND ANAEROBIC Blood Culture results may not be optimal due to an excessive volume of blood received in culture bottles   Culture   Final    NO GROWTH 3 DAYS Performed at [facility], [place] [address] [place], [place] [address]    Report Status PENDING  Incomplete  Urine culture     Status: None   Collection Time: 09/21/20  8:19 PM   Specimen: In/Out Cath Urine  Result Value Ref Range Status   Specimen Description IN/OUT CATH URINE  Final   Special Requests NONE  Final   Culture   Final    NO GROWTH Performed at [facility], [place] [address], [place], [place] [address]    Report Status 09/23/2020 FINAL  Final  Resp Panel by RT-PCR (Flu A&B, Covid) Nasopharyngeal Swab     Status: None   Collection Time: 09/21/20  8:54 PM   Specimen: Nasopharyngeal Swab; Nasopharyngeal(NP) swabs in vial transport medium  Result Value Ref Range Status   SARS Coronavirus 2 by RT PCR NEGATIVE NEGATIVE Final    Comment: (NOTE) SARS-CoV-2 target nucleic acids are NOT DETECTED.  The SARS-CoV-2 RNA is generally detectable in upper respiratory specimens during the acute phase of  infection. The lowest concentration of SARS-CoV-2 viral copies this assay can  detect is 138 copies/mL. A negative result does not preclude SARS-Cov-2 infection and should not be used as the sole basis for treatment or other patient management decisions. A negative result may occur with  improper specimen collection/handling, submission of specimen other than nasopharyngeal swab, presence of viral mutation(s) within the areas targeted by this assay, and inadequate number of viral copies(<138 copies/mL). A negative result must be combined with clinical observations, patient history, and epidemiological information. The expected result is Negative.  Fact Sheet for Patients:  EntrepreneurPulse.com.au  Fact Sheet for Healthcare Providers:  IncredibleEmployment.be  This test is no t yet approved or cleared by the [place] FDA and  has been authorized for detection and/or diagnosis of SARS-CoV-2 by FDA under an Emergency Use Authorization (EUA). This EUA will remain  in effect (meaning this test can be used) for the duration of the COVID-19 declaration under Section 564(b)(1) of the Act, 21 U.S.C.section 360bbb-3(b)(1), unless the authorization is terminated  or revoked sooner.       Influenza A by PCR NEGATIVE NEGATIVE Final   Influenza B by PCR NEGATIVE NEGATIVE Final    Comment: (NOTE) The Xpert Xpress SARS-CoV-2/FLU/RSV plus assay is intended as an aid in the diagnosis of influenza from Nasopharyngeal swab specimens and should not be used as a sole basis for treatment. Nasal washings and aspirates are unacceptable for Xpert Xpress SARS-CoV-2/FLU/RSV testing.  Fact Sheet for Patients: EntrepreneurPulse.com.au  Fact Sheet for Healthcare Providers: IncredibleEmployment.be  This test is not yet approved or cleared by the [place] FDA and has been authorized for detection and/or diagnosis of SARS-CoV-2 by FDA under an Emergency Use Authorization (EUA). This EUA will remain in  effect (meaning this test can be used) for the duration of the COVID-19 declaration under Section 564(b)(1) of the Act, 21 U.S.C. section 360bbb-3(b)(1), unless the authorization is terminated or revoked.  Performed at [facility], [place] [address] [place], [place] [address]   MRSA PCR Screening     Status: None   Collection Time: 09/22/20  2:02 AM   Specimen: Nasal Mucosa; Nasopharyngeal  Result Value Ref Range Status   MRSA by PCR NEGATIVE NEGATIVE Final    Comment:        The GeneXpert MRSA Assay (FDA approved for NASAL specimens only), is one component of a comprehensive MRSA colonization surveillance program. It is not intended to diagnose MRSA infection nor to guide or monitor treatment for MRSA infections. Performed at [facility], [place] [address] [place], [place] [address]          Radiology Studies: IR Venocavagram Svc  Result Date: 09/22/2020 CLINICAL DATA:  Renal failure, progressive. Previously placed dialysis fistula could not be utilized for dialysis and a temporary hemodialysis catheter is requested. EXAM: EXAM LEFT JUGULAR VENOGRAM RIGHT IJ CATHETER PLACEMENT UNDER  FLUOROSCOPIC GUIDANCE ULTRASOUND GUIDANCE FOR VASCULAR ACCESS X 2 TECHNIQUE: The procedure, risks (including but not limited to bleeding, infection, organ damage, pneumothorax), benefits, and alternatives were explained to the patient. Questions regarding the procedure were encouraged and answered. The patient understands and consents to the procedure. Initially, a left-sided approach was selected due to the available catheters. Patency of the left IJ vein was confirmed with ultrasound with image documentation. An appropriate skin site was determined. Skin site was marked. Region was prepped using maximum barrier technique including cap and mask, sterile gown, sterile gloves, large sterile sheet, and Chlorhexidine as  cutaneous antisepsis. The region was infiltrated locally with 1% lidocaine.  Under real-time ultrasound guidance, the vein was accessed with a 21-gauge micropuncture needle, exchanged over a 018 guidewire for a transitional dilator, through which a 035 guidewire was advanced. This would not pass centrally to the SVC. Venogram was performed. 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 Patency of the right external jugular vein was confirmed with ultrasound with image documentation. An appropriate skin site was determined. Skin site was marked. Region was prepped using maximum barrier technique including cap and mask, sterile gown, sterile gloves, large sterile sheet, and Chlorhexidine as cutaneous antisepsis. The region was infiltrated locally with 1% lidocaine. Under real-time ultrasound guidance, the vein was accessed with a 21-gauge micropuncture needle, exchanged over a 018 guidewire for a transitional dilator, through which a 035 guidewire was advanced, allowing passage of vascular dilator which allowed advancement of a 20 cm Trialysis catheter. This was positioned with the tip mid right atrium. Spot chest radiograph shows good positioning and no pneumothorax. Catheter was flushed and sutured externally with 0-Prolene sutures. Patient tolerated the procedure well. FLUOROSCOPY TIME:  54 seconds; 77.8 mGy COMPLICATIONS: COMPLICATIONS none IMPRESSION: 1. 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 2. Technically successful right EJ Trialysis catheter placement. Electronically Signed   By: Lucrezia Europe M.D.   On: 09/22/2020 12:48   IR Fluoro Guide CV Line Right  Result Date: 09/22/2020 CLINICAL DATA:  Renal failure, progressive. Previously placed dialysis fistula could not be utilized for dialysis and a temporary hemodialysis catheter is requested. EXAM: EXAM LEFT JUGULAR VENOGRAM RIGHT IJ CATHETER  PLACEMENT UNDER  FLUOROSCOPIC GUIDANCE ULTRASOUND GUIDANCE FOR VASCULAR ACCESS X 2 TECHNIQUE: The procedure, risks (including but not limited to bleeding, infection, organ damage, pneumothorax), benefits, and alternatives were explained to the patient. Questions regarding the procedure were encouraged and answered. The patient understands and consents to the procedure. Initially, a left-sided approach was selected due to the available catheters. Patency of the left IJ vein was confirmed with ultrasound with image documentation. An appropriate skin site was determined. Skin site was marked. Region was prepped using maximum barrier technique including cap and mask, sterile gown, sterile gloves, large sterile sheet, and Chlorhexidine as cutaneous antisepsis. The region was infiltrated locally with 1% lidocaine. Under real-time ultrasound guidance, the vein was accessed with a 21-gauge micropuncture needle, exchanged over a 018 guidewire for a transitional dilator, through which a 035 guidewire was advanced. This would not pass centrally to the SVC. Venogram was performed. 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 Patency of the right external jugular vein was confirmed with ultrasound with image documentation. An appropriate skin site was determined. Skin site was marked. Region was prepped using maximum barrier technique including cap and mask, sterile gown, sterile gloves, large sterile sheet, and Chlorhexidine as cutaneous antisepsis. The region was infiltrated locally with 1% lidocaine. Under real-time ultrasound guidance, the vein was accessed with a 21-gauge micropuncture needle, exchanged over a 018 guidewire for a transitional dilator, through which a 035 guidewire was advanced, allowing passage of vascular dilator which allowed advancement of a 20 cm Trialysis catheter. This was positioned with the  tip mid right atrium. Spot chest radiograph shows good positioning and no pneumothorax. Catheter was flushed and sutured externally with 0-Prolene  sutures. Patient tolerated the procedure well. FLUOROSCOPY TIME:  54 seconds; 12.4 mGy COMPLICATIONS: COMPLICATIONS none IMPRESSION: 1. 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 2. Technically successful right EJ Trialysis catheter placement. Electronically Signed   By: Lucrezia Europe M.D.   On: 09/22/2020 12:48   IR US Guide Vasc Access Left  Result Date: 09/22/2020 CLINICAL DATA:  Renal failure, progressive. Previously placed dialysis fistula could not be utilized for dialysis and a temporary hemodialysis catheter is requested. EXAM: EXAM LEFT JUGULAR VENOGRAM RIGHT IJ CATHETER PLACEMENT UNDER  FLUOROSCOPIC GUIDANCE ULTRASOUND GUIDANCE FOR VASCULAR ACCESS X 2 TECHNIQUE: The procedure, risks (including but not limited to bleeding, infection, organ damage, pneumothorax), benefits, and alternatives were explained to the patient. Questions regarding the procedure were encouraged and answered. The patient understands and consents to the procedure. Initially, a left-sided approach was selected due to the available catheters. Patency of the left IJ vein was confirmed with ultrasound with image documentation. An appropriate skin site was determined. Skin site was marked. Region was prepped using maximum barrier technique including cap and mask, sterile gown, sterile gloves, large sterile sheet, and Chlorhexidine as cutaneous antisepsis. The region was infiltrated locally with 1% lidocaine. Under real-time ultrasound guidance, the vein was accessed with a 21-gauge micropuncture needle, exchanged over a 018 guidewire for a transitional dilator, through which a 035 guidewire was advanced. This would not pass centrally to the SVC. Venogram was performed. This demonstrated long segment occlusion  of the left innominate vein with immature thyrocervical collateral network to the right external jugular system. Patency of the right external jugular vein was confirmed with ultrasound with image documentation. An appropriate skin site was determined. Skin site was marked. Region was prepped using maximum barrier technique including cap and mask, sterile gown, sterile gloves, large sterile sheet, and Chlorhexidine as cutaneous antisepsis. The region was infiltrated locally with 1% lidocaine. Under real-time ultrasound guidance, the vein was accessed with a 21-gauge micropuncture needle, exchanged over a 018 guidewire for a transitional dilator, through which a 035 guidewire was advanced, allowing passage of vascular dilator which allowed advancement of a 20 cm Trialysis catheter. This was positioned with the tip mid right atrium. Spot chest radiograph shows good positioning and no pneumothorax. Catheter was flushed and sutured externally with 0-Prolene sutures. Patient tolerated the procedure well. FLUOROSCOPY TIME:  54 seconds; 58.0 mGy COMPLICATIONS: COMPLICATIONS none IMPRESSION: 1. 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 2. Technically successful right EJ Trialysis catheter placement. Electronically Signed   By: Lucrezia Europe M.D.   On: 09/22/2020 12:48   IR US Guide Vasc Access Right  Result Date: 09/22/2020 CLINICAL DATA:  Renal failure, progressive. Previously placed dialysis fistula could not be utilized for dialysis and a temporary hemodialysis catheter is requested. EXAM: EXAM LEFT JUGULAR VENOGRAM RIGHT IJ CATHETER PLACEMENT UNDER  FLUOROSCOPIC GUIDANCE ULTRASOUND GUIDANCE FOR VASCULAR ACCESS X 2 TECHNIQUE: The procedure, risks (including but not limited to bleeding, infection, organ damage, pneumothorax), benefits, and alternatives were explained to the patient. Questions regarding the procedure were encouraged and  answered. The patient understands and consents to the procedure. Initially, a left-sided approach was selected due to the available catheters. Patency of the left IJ vein was confirmed with ultrasound with image documentation. An appropriate skin site was determined. Skin site was marked. Region was prepped using maximum barrier technique including cap and  mask, sterile gown, sterile gloves, large sterile sheet, and Chlorhexidine as cutaneous antisepsis. The region was infiltrated locally with 1% lidocaine. Under real-time ultrasound guidance, the vein was accessed with a 21-gauge micropuncture needle, exchanged over a 018 guidewire for a transitional dilator, through which a 035 guidewire was advanced. This would not pass centrally to the SVC. Venogram was performed. 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 Patency of the right external jugular vein was confirmed with ultrasound with image documentation. An appropriate skin site was determined. Skin site was marked. Region was prepped using maximum barrier technique including cap and mask, sterile gown, sterile gloves, large sterile sheet, and Chlorhexidine as cutaneous antisepsis. The region was infiltrated locally with 1% lidocaine. Under real-time ultrasound guidance, the vein was accessed with a 21-gauge micropuncture needle, exchanged over a 018 guidewire for a transitional dilator, through which a 035 guidewire was advanced, allowing passage of vascular dilator which allowed advancement of a 20 cm Trialysis catheter. This was positioned with the tip mid right atrium. Spot chest radiograph shows good positioning and no pneumothorax. Catheter was flushed and sutured externally with 0-Prolene sutures. Patient tolerated the procedure well. FLUOROSCOPY TIME:  54 seconds; 07.6 mGy COMPLICATIONS: COMPLICATIONS none IMPRESSION: 1. Long segment occlusion of  the left innominate vein with mature thyrocervical collateral network draining into the right EJ vein. 2. Technically successful right EJ Trialysis catheter placement. Electronically Signed   By: Lucrezia Europe M.D.   On: 09/22/2020 12:48        Scheduled Meds: . [MAR Hold] sodium chloride   Intravenous Once  . [MAR Hold] amLODipine  10 mg Oral Daily  . [MAR Hold] calcium acetate  667 mg Oral TID WC  . [MAR Hold] Chlorhexidine Gluconate Cloth  6 each Topical Q0600  . [MAR Hold] darbepoetin (ARANESP) injection - DIALYSIS  100 mcg Intravenous Q Mon-HD  . [MAR Hold] dextromethorphan-guaiFENesin  1 tablet Oral BID  . [MAR Hold] doxycycline  100 mg Oral Q12H  . [MAR Hold] furosemide  80 mg Intravenous Q12H  . [MAR Hold] hydrALAZINE  25 mg Oral BID  . [MAR Hold] metoprolol succinate  75 mg Oral Daily  . [MAR Hold] multivitamin  1 tablet Oral QHS  . [MAR Hold] nitroGLYCERIN  0.2 mg Transdermal Daily  . [MAR Hold] rosuvastatin  10 mg Oral Daily   Continuous Infusions: . [MAR Hold] sodium chloride    . [MAR Hold] cefTRIAXone (ROCEPHIN)  IV 2 g (09/23/20 2229)  . [MAR Hold] ferric gluconate (FERRLECIT/NULECIT) IV 250 mg (09/23/20 1513)  . heparin Stopped (09/24/20 2263)     LOS: 3 days    Time spent: 30 minutes    Barb Merino, MD [facility] Pager [phone]

## 2020-09-24 NOTE — Progress Notes (Signed)
Renal Navigator received notification from Nephrologist/Dr. Carolin Sicks that patient needs referral for outpatient HD treatment for ESRD. Navigator recalls meeting patient previously when she needed referral for AKI tx. Navigator attempted to meet with patient to discuss, however, she was in VVS procedure at the time. Navigator will follow up tomorrow.   Alphonzo Cruise, [place] Renal Navigator [phone]

## 2020-09-24 NOTE — Anesthesia Postprocedure Evaluation (Signed)
Anesthesia Post Note  Patient: KAZIYAH PARKISON  Procedure(s) Performed: LIGATION OF LEFT ARM ARTERIOVENOUS  FISTULA (Left Arm Upper) INSERTION OF TUNNELED DIALYSIS CATHETER (Right Chest)     Patient location during evaluation: PACU Anesthesia Type: Regional and MAC Level of consciousness: awake and alert Pain management: pain level controlled Vital Signs Assessment: post-procedure vital signs reviewed and stable Respiratory status: spontaneous breathing, nonlabored ventilation, respiratory function stable and patient connected to nasal cannula oxygen Cardiovascular status: stable and blood pressure returned to baseline Postop Assessment: no apparent nausea or vomiting Anesthetic complications: no   No complications documented.  Last Vitals:  Vitals:   09/24/20 1340 09/24/20 1600  BP: (!) 166/93 (!) 176/83  Pulse: 63 64  Resp: 16 17  Temp: (!) 36 C   SpO2: 100% 100%    Last Pain:  Vitals:   09/24/20 1340  TempSrc:   PainSc: 0-No pain                 Belenda Cruise P Shontell Prosser

## 2020-09-24 NOTE — Op Note (Signed)
    OPERATIVE REPORT  DATE OF SURGERY: 09/24/2020  PATIENT: Monique Henderson, 70 y.o. female MRN: [id number]  DOB: 1950/09/05  PRE-OPERATIVE DIAGNOSIS: End-stage renal disease  POST-OPERATIVE DIAGNOSIS:  Same  PROCEDURE: #1 right IJ tunneled hemodialysis catheter, #2 ligation of left AV fistula  SURGEON:  Curt Jews, M.D.  PHYSICIAN ASSISTANT: Nurse  The assistant was needed for exposure and to expedite the case  ANESTHESIA: Local with sedation  EBL: per anesthesia record  Total I/O In: 250 [I.V.:250] Out: 10 [Blood:10]  BLOOD ADMINISTERED: none  DRAINS: none  SPECIMEN: none  COUNTS CORRECT:  YES  PATIENT DISPOSITION:  PACU - hemodynamically stable  PROCEDURE DETAILS: Patient was taken to the operating room placed evaluation where the area of the left arm and right and left neck and chest were prepped draped you sterile fashion.  Patient had indwelling temporary catheter.  By operative note from interventional radiology this was placed in the external jugular vein but some of the notation says internal jugular vein as well.  A guidewire was passed through the existing catheter and tract easily into the right atrium.  The catheter was removed and a dilator and peel-away sheath was passed over this.  A 23 cm tunnel catheter was brought through a separate stab incision was placed through the peel-away sheath.  The peel-away sheath was then removed.  The catheter tips were positioned in the level of distal right atrium.  Both lumens flushed and aspirated easily and were locked with 1000/cc heparin.  The catheter was secured to the skin with a 3-0 nylon stitch and the entry site was closed with a 4 subcuticular Vicryl stitch.  Attention was then turned to the left arm.  SonoSite ultrasound was used to visualize the brachiocephalic fistula.  An incision was made over the above the antecubital space using local anesthesia.  There was some hematoma around the vein.  The vein was  doubly ligated with 2-0 silk ties.  The wound was closed with 3-0 Vicryl in the subcutaneous and subcuticular tissue.  Sterile dressing was applied the patient was transferred to the recovery room in stable condition   Rosetta Posner, M.D., [facility] 09/24/2020 2:11 PM  Note: Portions of this report may have been transcribed using voice recognition software.  Every effort has been made to ensure accuracy; however, inadvertent computerized transcription errors may still be present.

## 2020-09-24 NOTE — Progress Notes (Signed)
[place] for Heparin Indication: atrial fibrillation  Allergies  Allergen Reactions  . Shrimp [Shellfish Allergy] Shortness Of Breath  . Bactroban [Mupirocin] Other (See Comments)    "Sores in nose"  . Vicodin [Hydrocodone-Acetaminophen] Itching and Nausea And Vomiting    This is patient's home medication  . Eliquis [Apixaban] Rash  . Lisinopril Cough  . Tylenol [Acetaminophen] Itching    Patient Measurements: Height: _0  (160 cm) Weight: 45 kg (99 lb 3.3 oz) IBW/kg (Calculated) : 52.4  Vital Signs: Temp: 96.8 F (36 C) (04/26 1340) BP: 166/93 (04/26 1340) Pulse Rate: 63 (04/26 1340)  Labs: Recent Labs    09/21/20 2021 09/21/20 2300 09/22/20 0447 09/22/20 1250 09/22/20 1303 09/22/20 2023 09/23/20 0400 09/23/20 0430 09/23/20 1454 09/23/20 2221 09/24/20 0056  HGB 7.3*  --    < > 5.5*  --  8.5* 8.6*  --   --   --  8.8*  HCT 23.4*  --    < > 17.9*  --  25.3* 26.0*  --   --   --  26.4*  PLT 411*  --    < > 330  --   --  275  --   --   --  267  APTT 40*  --   --   --   --   --   --   --   --   --   --   LABPROT 14.4  --   --   --   --   --   --   --   --   --   --   INR 1.1  --   --   --   --   --   --   --   --   --   --   HEPARINUNFRC  --   --    < >  --   --  0.19*  --    < > 0.30 0.33 0.30  CREATININE 14.20*  --    < >  --  13.98*  --  9.54*  --   --   --  5.73*  TROPONINIHS 120* 141*  --   --   --   --   --   --   --   --   --    < > = values in this interval not displayed.    Estimated Creatinine Clearance: 6.7 mL/min (A) (by C-G formula based on SCr of 5.73 mg/dL (H)).   Medical History: Past Medical History:  Diagnosis Date  . AF (paroxysmal atrial fibrillation) ([place]) 05/29/2019  . Aortic atherosclerosis ([place]) 07/05/2019  . Aortic dissection ([place]) 04/04/2019  . Atelectasis 2002   Bilateral  . Atrial fibrillation with RVR ([place]) 05/18/2019  . Atypical chest pain 07/05/2019  . Bone spur 2008   Right calcaneal foot  spur  . Breast cancer ([place]) 2004   Ductal carcinoma in situ of the left breast; S/P left partial mastectomy 02/26/2003; S/P re-excision of cranial and lateral margins11/18/2004.radiation  . Breast cancer ([place]) 09/21/2012   right breast/ last radiation treatment 03/22/2013  . Cerebral thrombosis with cerebral infarction 05/22/2019  . Chronic kidney disease, stage IV (severe) ([place]) 10/10/2007  . Chronic low back pain 06/22/2016  . Chronic obstructive lung disease ([place]) 01/16/2017  . CKD (chronic kidney disease), stage IV ([place]) 07/19/2017  . 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 Autumn Messing; S/P re-excision of superior and inferior margins 10/27/2012.   . Dissection of aorta ([place]) 04/03/2019  . Elevated troponin 08/28/2019  . ESRD on dialysis ([place]) 05/29/2019  . Essential hypertension 09/16/2006  . GERD 09/16/2006  . GERD (gastroesophageal reflux disease)   . H/O: stroke 05/29/2019  . 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 Hot flashes   . Hx of radiation therapy 2005   left breast  . Hx of radiation therapy 01/11/13- 03/22/13   right breast 5760 cGy 30 sessions  . Hyperkalemia 08/28/2019  . Hypertension   . Insomnia 03/14/2015  . Low back pain   . Lumbar spinal stenosis    S/P lumbar decompressive laminectomy, fusion and plating for lumbar spinal stensosis  . Malnutrition of moderate degree 05/19/2019  . Non compliance with medical treatment 12/04/2017  . Normocytic anemia    With thrombocytosis  . On Coumadin for atrial fibrillation ([place]) 10/03/2019  . Osteoarthritis   . Paroxysmal SVT (supraventricular tachycardia) ([place]) 08/27/2019  . Personal history of radiation therapy   . Pleural effusion 05/18/2019  . Positive D dimer 08/28/2019  . Right ureteral stone 2002  . S/P aortic aneurysm repair 04/07/2019  . S/P lumbar spinal fusion 01/18/2014   S/P lumbar decompressive laminectomy, fusion, and plating for  lumbar spinal stenosis on 05/27/2009 by Dr. Eustace Moore.  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 Eustace Moore.    . Shortness of breath    from pain  . SIRS (systemic inflammatory response syndrome) ([place]) 08/27/2019  . SVT (supraventricular tachycardia) ([place]) 08/28/2019  . Tobacco use disorder 04/19/2009  . Uterine fibroid   . Wears dentures    top    Medications:  No current facility-administered medications on file prior to encounter.   Current Outpatient Medications on File Prior to Encounter  Medication Sig Dispense Refill  . albuterol (VENTOLIN HFA) 108 (90 Base) MCG/ACT inhaler Inhale 2 puffs into the lungs every 6 (six) hours as needed for wheezing or shortness of breath. 8.5 g 0  . amLODipine (NORVASC) 10 MG tablet TAKE 1 TABLET (10 MG TOTAL) BY MOUTH DAILY. 90 tablet 0  . amoxicillin (AMOXIL) 500 MG capsule Take 1,000 mg by mouth 2 (two) times daily. For 7 days    . baclofen (LIORESAL) 10 MG tablet Take 10 mg by mouth 2 (two) times daily as needed for muscle spasms.    . buprenorphine (BUTRANS) 10 MCG/HR PTWK Place 1 patch onto the skin once a week.    . cloNIDine (CATAPRES - DOSED IN MG/24 HR) 0.3 mg/24hr patch Place 1 patch (0.3 mg total) onto the skin once a week. 4 patch 2  . cloNIDine (CATAPRES) 0.1 MG tablet Take 0.1 mg by mouth 2 (two) times daily as needed.    . cyclobenzaprine (FLEXERIL) 5 MG tablet Take 5 mg by mouth 3 (three) times daily as needed for muscle spasms.    . furosemide (LASIX) 20 MG tablet Take 1 tablet (20 mg total) by mouth daily. 60 tablet 5  . hydrALAZINE (APRESOLINE) 25 MG tablet Take 1 tablet (25 mg total) by mouth 2 (two) times daily.    Marland Kitchen loperamide (IMODIUM) 2 MG capsule Take 2 mg by mouth 2 (two) times daily as needed for diarrhea or loose stools.     Marland Kitchen  losartan (COZAAR) 100 MG tablet Take 100 mg by mouth at bedtime.    . metoprolol succinate (TOPROL-XL) 50 MG 24 hr tablet Take 1 tablet (50 mg total) by mouth daily. Take with or immediately following a meal. 90 tablet 1  . metoprolol tartrate (LOPRESSOR) 50 MG tablet Take 50 mg by mouth 2 (two) times daily.    . mirtazapine (REMERON) 7.5 MG tablet Take 1 tablet (7.5 mg total) by mouth at bedtime. 90 tablet 3  . NARCAN 4 MG/0.1ML LIQD nasal spray kit Place 1 spray into the nose as needed for opioid reversal. If no response within 3 minutes then repeat once in the other nostril.  Call 911.    . ondansetron (ZOFRAN) 4 MG tablet Take 1 tablet (4 mg total) by mouth every 8 (eight) hours as needed for nausea or vomiting. 20 tablet 3  . oxyCODONE-acetaminophen (PERCOCET) 10-325 MG tablet Take 1 tablet by mouth 3 (three) times daily as needed for severe pain.    . predniSONE (DELTASONE) 20 MG tablet Take 20 mg by mouth 2 (two) times daily. For 5 days    . rosuvastatin (CRESTOR) 10 MG tablet TAKE 1 TABLET (10 MG TOTAL) BY MOUTH DAILY. 30 tablet 0  . warfarin (COUMADIN) 6 MG tablet TAKE 1 TABLET (6 MG TOTAL) BY MOUTH 4 (FOUR) TIMES A WEEK. 30 tablet 0  . warfarin (COUMADIN) 7.5 MG tablet Take 1 tablet (7.5 mg total) by mouth 3 (three) times a week. 90 tablet 0  . [DISCONTINUED] oxyCODONE-acetaminophen (PERCOCET/ROXICET) 5-325 MG tablet Take 1 tablet by mouth 3 (three) times daily as needed for severe pain (pain.).       Assessment: 34 yoF with hx AFib on warfarin PTA held for heparin while procedures needed.  Heparin off this morning with need for AV fistula ligation, now to resume postop per VVS.   Goal of Therapy:  Heparin level 0.3-0.7 units/mL Monitor platelets by anticoagulation protocol: Yes   Plan:  Resume heparin 1000 units/h Recheck heparin level with am labs   Arrie Senate, PharmD, BCPS, [facility] Clinical Pharmacist [phone] Please check AMION for all [place]  numbers 09/24/2020

## 2020-09-25 ENCOUNTER — Encounter (HOSPITAL_COMMUNITY): Payer: Self-pay | Admitting: Vascular Surgery

## 2020-09-25 LAB — HEPARIN LEVEL (UNFRACTIONATED)
Heparin Unfractionated: 0.11 IU/mL — ABNORMAL LOW (ref 0.30–0.70)
Heparin Unfractionated: >1.1 IU/mL — ABNORMAL HIGH (ref 0.30–0.70)
Heparin Unfractionated: 0.37 IU/mL (ref 0.30–0.70)

## 2020-09-25 LAB — RENAL FUNCTION PANEL
Albumin: 2.4 g/dL — ABNORMAL LOW (ref 3.5–5.0)
Anion gap: 13 (ref 5–15)
BUN: 38 mg/dL — ABNORMAL HIGH (ref 8–23)
CO2: 25 mmol/L (ref 22–32)
Calcium: 8.5 mg/dL — ABNORMAL LOW (ref 8.9–10.3)
Chloride: 100 mmol/L (ref 98–111)
Creatinine, Ser: 7.26 mg/dL — ABNORMAL HIGH (ref 0.44–1.00)
GFR, Estimated: 6 mL/min — ABNORMAL LOW (ref >60)
Glucose, Bld: 94 mg/dL (ref 70–99)
Phosphorus: 6 mg/dL — ABNORMAL HIGH (ref 2.5–4.6)
Potassium: 3.8 mmol/L (ref 3.5–5.1)
Sodium: 138 mmol/L (ref 135–145)

## 2020-09-25 LAB — CBC
HCT: 24.9 % — ABNORMAL LOW (ref 36.0–46.0)
Hemoglobin: 7.8 g/dL — ABNORMAL LOW (ref 12.0–15.0)
MCH: 27.9 pg (ref 26.0–34.0)
MCHC: 31.3 g/dL (ref 30.0–36.0)
MCV: 88.9 fL (ref 80.0–100.0)
Platelets: 300 10*3/uL (ref 150–400)
RBC: 2.8 MIL/uL — ABNORMAL LOW (ref 3.87–5.11)
RDW: 16.6 % — ABNORMAL HIGH (ref 11.5–15.5)
WBC: 7.8 10*3/uL (ref 4.0–10.5)
nRBC: 0 % (ref 0.0–0.2)

## 2020-09-25 LAB — EXPECTORATED SPUTUM ASSESSMENT W GRAM STAIN, RFLX TO RESP C

## 2020-09-25 LAB — GLUCOSE, CAPILLARY: Glucose-Capillary: 105 mg/dL — ABNORMAL HIGH (ref 70–99)

## 2020-09-25 MED ORDER — WARFARIN - PHARMACIST DOSING INPATIENT: Freq: Every day | Status: DC

## 2020-09-25 MED ORDER — WARFARIN SODIUM 5 MG PO TABS
9.0000 mg | ORAL_TABLET | Freq: Once | ORAL | Status: AC
  Administered 2020-09-25: 9 mg via ORAL
  Filled 2020-09-25: qty 1

## 2020-09-25 MED ORDER — HEPARIN SODIUM (PORCINE) 1000 UNIT/ML IJ SOLN
INTRAMUSCULAR | Status: AC
  Filled 2020-09-25: qty 4

## 2020-09-25 NOTE — Progress Notes (Signed)
[place] for Heparin Indication: atrial fibrillation  Allergies  Allergen Reactions  . Shrimp [Shellfish Allergy] Shortness Of Breath  . Bactroban [Mupirocin] Other (See Comments)    "Sores in nose"  . Vicodin [Hydrocodone-Acetaminophen] Itching and Nausea And Vomiting    This is patient's home medication  . Eliquis [Apixaban] Rash  . Lisinopril Cough  . Tylenol [Acetaminophen] Itching    Patient Measurements: Height: 5' 3"  (160 cm) Weight: 45 kg (99 lb 3.3 oz) IBW/kg (Calculated) : 52.4  Vital Signs: Temp: 99.3 F (37.4 C) (04/26 2338) Temp Source: Oral (04/26 2338) BP: 157/81 (04/26 2338) Pulse Rate: 70 (04/26 2338)  Labs: Recent Labs    09/23/20 0400 09/23/20 0430 09/23/20 2221 09/24/20 0056 09/25/20 0140  HGB 8.6*  --   --  8.8* 7.8*  HCT 26.0*  --   --  26.4* 24.9*  PLT 275  --   --  267 300  HEPARINUNFRC  --    < > 0.33 0.30 0.11*  CREATININE 9.54*  --   --  5.73* 7.26*   < > = values in this interval not displayed.    Estimated Creatinine Clearance: 5.3 mL/min (A) (by C-G formula based on SCr of 7.26 mg/dL (H)).   Medical History: Past Medical History:  Diagnosis Date  . AF (paroxysmal atrial fibrillation) ([place]) 05/29/2019  . Aortic atherosclerosis ([place]) 07/05/2019  . Aortic dissection ([place]) 04/04/2019  . Atelectasis 2002   Bilateral  . Atrial fibrillation with RVR ([place]) 05/18/2019  . Atypical chest pain 07/05/2019  . Bone spur 2008   Right calcaneal foot spur  . Breast cancer ([place]) 2004   Ductal carcinoma in situ of the left breast; S/P left partial mastectomy 02/26/2003; S/P re-excision of cranial and lateral margins11/18/2004.radiation  . Breast cancer ([place]) 09/21/2012   right breast/ last radiation treatment 03/22/2013  . Cerebral thrombosis with cerebral infarction 05/22/2019  . Chronic kidney disease, stage IV (severe) ([place]) 10/10/2007  . Chronic low back pain 06/22/2016  . Chronic obstructive lung disease  ([place]) 01/16/2017  . CKD (chronic kidney disease), stage IV ([place]) 07/19/2017  . 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 Autumn Messing; S/P re-excision of superior and inferior margins 10/27/2012.   . Dissection of aorta ([place]) 04/03/2019  . Elevated troponin 08/28/2019  . ESRD on dialysis ([place]) 05/29/2019  . Essential hypertension 09/16/2006  . GERD 09/16/2006  . GERD (gastroesophageal reflux disease)   . H/O: stroke 05/29/2019  . 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 Hot flashes   . Hx of radiation therapy 2005   left breast  . Hx of radiation therapy 01/11/13- 03/22/13   right breast 5760 cGy 30 sessions  . Hyperkalemia 08/28/2019  . Hypertension   . Insomnia 03/14/2015  . Low back pain   . Lumbar spinal stenosis    S/P lumbar decompressive laminectomy, fusion and plating for lumbar spinal stensosis  . Malnutrition of moderate degree 05/19/2019  . Non compliance with medical treatment 12/04/2017  . Normocytic anemia    With thrombocytosis  . On Coumadin for atrial fibrillation ([place]) 10/03/2019  . Osteoarthritis   . Paroxysmal SVT (supraventricular tachycardia) ([place]) 08/27/2019  . Personal history of radiation therapy   . Pleural effusion 05/18/2019  . Positive D dimer 08/28/2019  . Right ureteral stone 2002  . S/P aortic aneurysm repair 04/07/2019  . S/P lumbar spinal fusion  01/18/2014   S/P lumbar decompressive laminectomy, fusion, and plating for lumbar spinal stenosis on 05/27/2009 by Dr. Eustace Moore.  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 Eustace Moore.    . Shortness of breath    from pain  . SIRS (systemic inflammatory response syndrome) ([place]) 08/27/2019  . SVT (supraventricular tachycardia) ([place])  08/28/2019  . Tobacco use disorder 04/19/2009  . Uterine fibroid   . Wears dentures    top    Medications:  No current facility-administered medications on file prior to encounter.   Current Outpatient Medications on File Prior to Encounter  Medication Sig Dispense Refill  . albuterol (VENTOLIN HFA) 108 (90 Base) MCG/ACT inhaler Inhale 2 puffs into the lungs every 6 (six) hours as needed for wheezing or shortness of breath. 8.5 g 0  . amLODipine (NORVASC) 10 MG tablet TAKE 1 TABLET (10 MG TOTAL) BY MOUTH DAILY. 90 tablet 0  . amoxicillin (AMOXIL) 500 MG capsule Take 1,000 mg by mouth 2 (two) times daily. For 7 days    . baclofen (LIORESAL) 10 MG tablet Take 10 mg by mouth 2 (two) times daily as needed for muscle spasms.    . buprenorphine (BUTRANS) 10 MCG/HR PTWK Place 1 patch onto the skin once a week.    . cloNIDine (CATAPRES - DOSED IN MG/24 HR) 0.3 mg/24hr patch Place 1 patch (0.3 mg total) onto the skin once a week. 4 patch 2  . cloNIDine (CATAPRES) 0.1 MG tablet Take 0.1 mg by mouth 2 (two) times daily as needed.    . cyclobenzaprine (FLEXERIL) 5 MG tablet Take 5 mg by mouth 3 (three) times daily as needed for muscle spasms.    . furosemide (LASIX) 20 MG tablet Take 1 tablet (20 mg total) by mouth daily. 60 tablet 5  . hydrALAZINE (APRESOLINE) 25 MG tablet Take 1 tablet (25 mg total) by mouth 2 (two) times daily.    Marland Kitchen loperamide (IMODIUM) 2 MG capsule Take 2 mg by mouth 2 (two) times daily as needed for diarrhea or loose stools.    Marland Kitchen losartan (COZAAR) 100 MG tablet Take 100 mg by mouth at bedtime.    . metoprolol succinate (TOPROL-XL) 50 MG 24 hr tablet Take 1 tablet (50 mg total) by mouth daily. Take with or immediately following a meal. 90 tablet 1  . metoprolol tartrate (LOPRESSOR) 50 MG tablet Take 50 mg by mouth 2 (two) times daily.    . mirtazapine (REMERON) 7.5 MG tablet Take 1 tablet (7.5 mg total) by mouth at bedtime. 90 tablet 3  . NARCAN 4 MG/0.1ML LIQD nasal spray kit  Place 1 spray into the nose as needed for opioid reversal. If no response within 3 minutes then repeat once in the other nostril.  Call 911.    . ondansetron (ZOFRAN) 4 MG tablet Take 1 tablet (4 mg total) by mouth every 8 (eight) hours as needed for nausea or vomiting. 20 tablet 3  . oxyCODONE-acetaminophen (PERCOCET) 10-325 MG tablet Take 1 tablet by mouth 3 (three) times daily as needed for severe pain.    . predniSONE (DELTASONE) 20 MG tablet Take 20 mg by mouth 2 (two) times daily. For 5 days    . rosuvastatin (CRESTOR) 10 MG tablet TAKE 1 TABLET (10 MG TOTAL) BY MOUTH DAILY. 30 tablet 0  . warfarin (  COUMADIN) 6 MG tablet TAKE 1 TABLET (6 MG TOTAL) BY MOUTH 4 (FOUR) TIMES A WEEK. 30 tablet 0  . warfarin (COUMADIN) 7.5 MG tablet Take 1 tablet (7.5 mg total) by mouth 3 (three) times a week. 90 tablet 0     Assessment: 89 yoF with hx AFib on warfarin PTA held for heparin while procedures needed.  Heparin off this morning with need for AV fistula ligation, now to resume postop per VVS.   4/27 AM update:  Heparin level low after re-start s/p HD cath placement  Hgb down some to 7.8, but no issues with bleeding noted per RN  Goal of Therapy:  Heparin level 0.3-0.7 units/mL Monitor platelets by anticoagulation protocol: Yes   Plan:  Inc heparin to 1100 units/hr 1200 heparin level Watch Hgb closely   Narda Bonds, PharmD, BCPS Clinical Pharmacist Phone: [phone]

## 2020-09-25 NOTE — Progress Notes (Signed)
Transported to HD by bed., stable. Ferrlicit sent with pt to be given in HD , Rn aware.

## 2020-09-25 NOTE — Progress Notes (Signed)
[place] for Heparin + warfarin Indication: atrial fibrillation  Allergies  Allergen Reactions  . Shrimp [Shellfish Allergy] Shortness Of Breath  . Bactroban [Mupirocin] Other (See Comments)    "Sores in nose"  . Vicodin [Hydrocodone-Acetaminophen] Itching and Nausea And Vomiting    This is patient's home medication  . Eliquis [Apixaban] Rash  . Lisinopril Cough  . Tylenol [Acetaminophen] Itching    Patient Measurements: Height: _0  (160 cm) Weight: 45 kg (99 lb 3.3 oz) IBW/kg (Calculated) : 52.4  Vital Signs: Temp: 99 F (37.2 C) (04/27 0756) Temp Source: Oral (04/27 0756) BP: 192/94 (04/27 0756) Pulse Rate: 77 (04/27 0756)  Labs: Recent Labs    09/23/20 0400 09/23/20 0430 09/23/20 2221 09/24/20 0056 09/25/20 0140  HGB 8.6*  --   --  8.8* 7.8*  HCT 26.0*  --   --  26.4* 24.9*  PLT 275  --   --  267 300  HEPARINUNFRC  --    < > 0.33 0.30 0.11*  CREATININE 9.54*  --   --  5.73* 7.26*   < > = values in this interval not displayed.    Estimated Creatinine Clearance: 5.3 mL/min (A) (by C-G formula based on SCr of 7.26 mg/dL (H)).   Medical History: Past Medical History:  Diagnosis Date  . AF (paroxysmal atrial fibrillation) ([place]) 05/29/2019  . Aortic atherosclerosis ([place]) 07/05/2019  . Aortic dissection ([place]) 04/04/2019  . Atelectasis 2002   Bilateral  . Atrial fibrillation with RVR ([place]) 05/18/2019  . Atypical chest pain 07/05/2019  . Bone spur 2008   Right calcaneal foot spur  . Breast cancer ([place]) 2004   Ductal carcinoma in situ of the left breast; S/P left partial mastectomy 02/26/2003; S/P re-excision of cranial and lateral margins11/18/2004.radiation  . Breast cancer ([place]) 09/21/2012   right breast/ last radiation treatment 03/22/2013  . Cerebral thrombosis with cerebral infarction 05/22/2019  . Chronic kidney disease, stage IV (severe) ([place]) 10/10/2007  . Chronic low back pain 06/22/2016  . Chronic obstructive lung  disease ([place]) 01/16/2017  . CKD (chronic kidney disease), stage IV ([place]) 07/19/2017  . 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 Autumn Messing; S/P re-excision of superior and inferior margins 10/27/2012.   . Dissection of aorta ([place]) 04/03/2019  . Elevated troponin 08/28/2019  . ESRD on dialysis ([place]) 05/29/2019  . Essential hypertension 09/16/2006  . GERD 09/16/2006  . GERD (gastroesophageal reflux disease)   . H/O: stroke 05/29/2019  . 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 Hot flashes   . Hx of radiation therapy 2005   left breast  . Hx of radiation therapy 01/11/13- 03/22/13   right breast 5760 cGy 30 sessions  . Hyperkalemia 08/28/2019  . Hypertension   . Insomnia 03/14/2015  . Low back pain   . Lumbar spinal stenosis    S/P lumbar decompressive laminectomy, fusion and plating for lumbar spinal stensosis  . Malnutrition of moderate degree 05/19/2019  . Non compliance with medical treatment 12/04/2017  . Normocytic anemia    With thrombocytosis  . On Coumadin for atrial fibrillation ([place]) 10/03/2019  . Osteoarthritis   . Paroxysmal SVT (supraventricular tachycardia) ([place]) 08/27/2019  . Personal history of radiation therapy   . Pleural effusion 05/18/2019  . Positive D dimer 08/28/2019  . Right ureteral stone 2002  . S/P aortic aneurysm repair 04/07/2019  . S/P lumbar  spinal fusion 01/18/2014   S/P lumbar decompressive laminectomy, fusion, and plating for lumbar spinal stenosis on 05/27/2009 by Dr. Eustace Moore.  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 Eustace Moore.    . Shortness of breath    from pain  . SIRS (systemic inflammatory response syndrome) ([place]) 08/27/2019  . SVT (supraventricular tachycardia) ([place])  08/28/2019  . Tobacco use disorder 04/19/2009  . Uterine fibroid   . Wears dentures    top    Medications:  No current facility-administered medications on file prior to encounter.   Current Outpatient Medications on File Prior to Encounter  Medication Sig Dispense Refill  . albuterol (VENTOLIN HFA) 108 (90 Base) MCG/ACT inhaler Inhale 2 puffs into the lungs every 6 (six) hours as needed for wheezing or shortness of breath. 8.5 g 0  . amLODipine (NORVASC) 10 MG tablet TAKE 1 TABLET (10 MG TOTAL) BY MOUTH DAILY. 90 tablet 0  . amoxicillin (AMOXIL) 500 MG capsule Take 1,000 mg by mouth 2 (two) times daily. For 7 days    . baclofen (LIORESAL) 10 MG tablet Take 10 mg by mouth 2 (two) times daily as needed for muscle spasms.    . buprenorphine (BUTRANS) 10 MCG/HR PTWK Place 1 patch onto the skin once a week.    . cloNIDine (CATAPRES - DOSED IN MG/24 HR) 0.3 mg/24hr patch Place 1 patch (0.3 mg total) onto the skin once a week. 4 patch 2  . cloNIDine (CATAPRES) 0.1 MG tablet Take 0.1 mg by mouth 2 (two) times daily as needed.    . cyclobenzaprine (FLEXERIL) 5 MG tablet Take 5 mg by mouth 3 (three) times daily as needed for muscle spasms.    . furosemide (LASIX) 20 MG tablet Take 1 tablet (20 mg total) by mouth daily. 60 tablet 5  . hydrALAZINE (APRESOLINE) 25 MG tablet Take 1 tablet (25 mg total) by mouth 2 (two) times daily.    Marland Kitchen loperamide (IMODIUM) 2 MG capsule Take 2 mg by mouth 2 (two) times daily as needed for diarrhea or loose stools.    Marland Kitchen losartan (COZAAR) 100 MG tablet Take 100 mg by mouth at bedtime.    . metoprolol succinate (TOPROL-XL) 50 MG 24 hr tablet Take 1 tablet (50 mg total) by mouth daily. Take with or immediately following a meal. 90 tablet 1  . metoprolol tartrate (LOPRESSOR) 50 MG tablet Take 50 mg by mouth 2 (two) times daily.    . mirtazapine (REMERON) 7.5 MG tablet Take 1 tablet (7.5 mg total) by mouth at bedtime. 90 tablet 3  . NARCAN 4 MG/0.1ML LIQD nasal spray kit  Place 1 spray into the nose as needed for opioid reversal. If no response within 3 minutes then repeat once in the other nostril.  Call 911.    . ondansetron (ZOFRAN) 4 MG tablet Take 1 tablet (4 mg total) by mouth every 8 (eight) hours as needed for nausea or vomiting. 20 tablet 3  . oxyCODONE-acetaminophen (PERCOCET) 10-325 MG tablet Take 1 tablet by mouth 3 (three) times daily as needed for severe pain.    . predniSONE (DELTASONE) 20 MG tablet Take 20 mg by mouth 2 (two) times daily. For 5 days    . rosuvastatin (CRESTOR) 10 MG tablet TAKE 1 TABLET (10 MG TOTAL) BY MOUTH DAILY. 30 tablet 0  .  warfarin (COUMADIN) 6 MG tablet TAKE 1 TABLET (6 MG TOTAL) BY MOUTH 4 (FOUR) TIMES A WEEK. 30 tablet 0  . warfarin (COUMADIN) 7.5 MG tablet Take 1 tablet (7.5 mg total) by mouth 3 (three) times a week. 90 tablet 0     Assessment: 86 yoF with hx AFib and CVA on warfarin PTA held for heparin while procedures needed. Pharmacy asked to resume warfarin tonight and continue heparin drip for now with hx CVA. INR 1.1 on admit, pt reports missing several doses PTA.   *Home warfarin dose 7.67m MWF, 632mTRSS.  Goal of Therapy:  Heparin level 0.3-0.7 units/mL Monitor platelets by anticoagulation protocol: Yes   Plan:  -Heparin 1100 units/h -Warfarin 78m20m1 tonight -Daily INR, heparin level, CBC   MicArrie SenateharmD, BCPBuchanan Lake VillageCCEncompass Health Rehabilitation Hospitalinical Pharmacist [phone] check AMION for all [facility] 09/25/2020

## 2020-09-25 NOTE — Progress Notes (Signed)
ANTICOAGULATION CONSULT NOTE - Follow Up Consult  Pharmacy Consult for Heparin >> Warfarin Indication: atrial fibrillation  Allergies  Allergen Reactions  . Shrimp [Shellfish Allergy] Shortness Of Breath  . Bactroban [Mupirocin] Other (See Comments)    "Sores in nose"  . Vicodin [Hydrocodone-Acetaminophen] Itching and Nausea And Vomiting    This is patient's home medication  . Eliquis [Apixaban] Rash  . Lisinopril Cough  . Tylenol [Acetaminophen] Itching    Patient Measurements: Height: 5\' 3"  (160 cm) Weight: 45 kg (99 lb 3.3 oz) IBW/kg (Calculated) : 52.4  Heparin Dosing Weight: 45 kg  Vital Signs: Temp: 97.2 F (36.2 C) (04/27 1310) Temp Source: Oral (04/27 1310) BP: 197/130 (04/27 1600) Pulse Rate: 69 (04/27 1312)  Labs: Recent Labs    09/23/20 0400 09/23/20 0430 09/24/20 0056 09/25/20 0140 09/25/20 1200 09/25/20 1417  HGB 8.6*  --  8.8* 7.8*  --   --   HCT 26.0*  --  26.4* 24.9*  --   --   PLT 275  --  267 300  --   --   HEPARINUNFRC  --    < > 0.30 0.11* >1.10* 0.37  CREATININE 9.54*  --  5.73* 7.26*  --   --    < > = values in this interval not displayed.    Estimated Creatinine Clearance: 5.3 mL/min (A) (by C-G formula based on SCr of 7.26 mg/dL (H)).   Medical History: Past Medical History:  Diagnosis Date  . AF (paroxysmal atrial fibrillation) ([place]) 05/29/2019  . Aortic atherosclerosis ([place]) 07/05/2019  . Aortic dissection ([place]) 04/04/2019  . Atelectasis 2002   Bilateral  . Atrial fibrillation with RVR ([place]) 05/18/2019  . Atypical chest pain 07/05/2019  . Bone spur 2008   Right calcaneal foot spur  . Breast cancer ([place]) 2004   Ductal carcinoma in situ of the left breast; S/P left partial mastectomy 02/26/2003; S/P re-excision of cranial and lateral margins11/18/2004.radiation  . Breast cancer ([place]) 09/21/2012   right breast/ last radiation treatment 03/22/2013  . Cerebral thrombosis with cerebral infarction 05/22/2019  . Chronic kidney disease,  stage IV (severe) ([place]) 10/10/2007  . Chronic low back pain 06/22/2016  . Chronic obstructive lung disease ([place]) 01/16/2017  . CKD (chronic kidney disease), stage IV ([place]) 07/19/2017  . 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 Autumn Messing; S/P re-excision of superior and inferior margins 10/27/2012.   . Dissection of aorta ([place]) 04/03/2019  . Elevated troponin 08/28/2019  . ESRD on dialysis ([place]) 05/29/2019  . Essential hypertension 09/16/2006  . GERD 09/16/2006  . GERD (gastroesophageal reflux disease)   . H/O: stroke 05/29/2019  . 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 Hot flashes   . Hx of radiation therapy 2005   left breast  . Hx of radiation therapy 01/11/13- 03/22/13   right breast 5760 cGy 30 sessions  . Hyperkalemia 08/28/2019  . Hypertension   . Insomnia 03/14/2015  . Low back pain   . Lumbar spinal stenosis    S/P lumbar decompressive laminectomy, fusion and plating for lumbar spinal stensosis  . Malnutrition of moderate degree 05/19/2019  . Non compliance with medical treatment 12/04/2017  . Normocytic anemia    With thrombocytosis  . On Coumadin for atrial fibrillation ([place]) 10/03/2019  . Osteoarthritis   . Paroxysmal SVT (supraventricular tachycardia) ([place]) 08/27/2019  . Personal history of radiation therapy   . Pleural  effusion 05/18/2019  . Positive D dimer 08/28/2019  . Right ureteral stone 2002  . S/P aortic aneurysm repair 04/07/2019  . S/P lumbar spinal fusion 01/18/2014   S/P lumbar decompressive laminectomy, fusion, and plating for lumbar spinal stenosis on 05/27/2009 by Dr. Eustace Moore.  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 Eustace Moore.    . Shortness of breath    from pain  . SIRS  (systemic inflammatory response syndrome) ([place]) 08/27/2019  . SVT (supraventricular tachycardia) ([place]) 08/28/2019  . Tobacco use disorder 04/19/2009  . Uterine fibroid   . Wears dentures    top    Assessment: 70 yr old woman with hx AFib and CVA, for which she was taking warfarin PTA; warfarin on hold for procedures. Pharmacy was consulted for heparin dosing while warfarin on hold. Pharmacy was asked to resume warfarin tonight and continue heparin drip for now with hx CVA. INR 1.1 on admit, pt reports missing several doses PTA.   *Home warfarin dose 7.5 mg MWF, 6 mg TTSS  Heparin level ~ 10 hrs after increasing heparin infusion at 1100 units/hr was 0.37 units/ml, which is within the goal range for this pt. H/H 7/824.9, plt 300. Per HD RN, no issues with IV or bleeding observed.   Goal of Therapy:  Heparin level 0.3-0.7 units/mL Monitor platelets by anticoagulation protocol: Yes   Plan:  Continue heparin infusion at 1100 units/hr Check confirmatory heparin level in 8 hrs Give warfarin 9 mg X 1 this evening Monitor daily heparin level, INR, CBC Monitor for bleeding  Gillermina Hu, PharmD, BCPS, [facility] Clinical Pharmacist 09/25/2020

## 2020-09-25 NOTE — Progress Notes (Addendum)
[facility]  Subjective  - Left arm feels a little better   Objective (!) 175/78 69 98.9 F (37.2 C) (Oral) 17 98%  Intake/Output Summary (Last 24 hours) at 09/25/2020 0654 Last data filed at 09/25/2020 0500 Gross per 24 hour  Intake 950 ml  Output 11 ml  Net 939 ml    Left UE hand motor and sensation grossly intact Incision healing well Lungs non labored breathing Right TDC intact without bleeding at exit site.  Assessment/Planning: POD # 1 left AV fistula ligation with central venous stenosis Right TDC placement. We will follow her as an out patient for new access options once she has recovered from this surgery.  F/U appt will be scheduled with our office.    Roxy Horseman 09/25/2020 6:54 AM --  Agree with above Call if questions  Ruta Hinds, MD [facility] [place] Office: [phone]   Laboratory Lab Results: Recent Labs    09/24/20 0056 09/25/20 0140  WBC 8.3 7.8  HGB 8.8* 7.8*  HCT 26.4* 24.9*  PLT 267 300   BMET Recent Labs    09/24/20 0056 09/25/20 0140  NA 135 138  K 3.5 3.8  CL 99 100  CO2 26 25  GLUCOSE 132* 94  BUN 30* 38*  CREATININE 5.73* 7.26*  CALCIUM 8.3* 8.5*    COAG Lab Results  Component Value Date   INR 1.1 09/21/2020   INR 1.4 (H) 05/13/2020   INR 1.6 (H) 04/29/2020   No results found for: PTT

## 2020-09-25 NOTE — Progress Notes (Signed)
   09/25/20 1121  Assess: MEWS Score  Temp 99.1 F (37.3 C)  BP (!) 202/87  Pulse Rate 71  ECG Heart Rate 71  Resp 17  SpO2 96 %  O2 Device Room Air  Assess: MEWS Score  MEWS Temp 0  MEWS Systolic 2  MEWS Pulse 0  MEWS RR 0  MEWS LOC 0  MEWS Score 2  MEWS Score Color Yellow  Assess: if the MEWS score is Yellow or Red  Were vital signs taken at a resting state? Yes  Focused Assessment No change from prior assessment  Early Detection of Sepsis Score *See Row Information* Low  MEWS guidelines implemented *See Row Information* Yes  Take Vital Signs  Increase Vital Sign Frequency  Yellow: Q 2hr X 2 then Q 4hr X 2, if remains yellow, continue Q 4hrs  Escalate  MEWS: Escalate Yellow: discuss with charge nurse/RN and consider discussing with provider and RRT  Notify: Charge Nurse/RN  Name of Charge Nurse/RN Notified amina  Date Charge Nurse/RN Notified 09/25/20  Time Charge Nurse/RN Notified 1222  Document  Patient Outcome Stabilized after interventions  Progress note created (see row info) Yes

## 2020-09-25 NOTE — Progress Notes (Signed)
Renal Navigator met with patient at bedside to discuss outpatient HD referral. Patient was sleepy, but pleasant and welcoming and able to carry on a conversation. Patient understands need to return to HD treatment, but asks if she has to go 3 x per week again. Navigator confirmed. Patient seemed disappointed, but understanding. She would like to return to [facility] clinic, but states she would rather have a second shift schedule than a first, which was her preference the last time she was receiving HD treatments. Navigator will request second shift at [facility] and see if they have a seat on this schedule at this clinic to accommodate patient. Patient utilized [facility] in the past, which Navigator recalls may have been waiving the fee at that time. Navigator explained to patient that the cost currently is $2.00 each way, but that patient has Medicaid and this transportation is free to her. Navigator explained that she will need to enroll if she has never ridden with them before she can schedule rides. Patient was appreciative of the information and she will think about what she wants to do. She also states she has family who can possibly take her for her first few treatments while she gets transportation arranged. Navigator contacted [facility] and has confirmed that patient is still eligible to ride through 07/29/22 and will notify patient of this.  Navigator submitted referral for outpatient HD to [facility] and will follow closely.   Alphonzo Cruise, [place] Renal Navigator [phone]

## 2020-09-25 NOTE — Progress Notes (Signed)
[facility] NEPHROLOGY PROGRESS NOTE  Assessment/ Plan: Pt is a 70 y.o. 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  She required dialysis in the past and then had some recovery.  Now, presented with creatinine level 14 with volume overload.  Not responding with IV diuretics.  She now has progression to ESRD therefore initiated dialysis on 4/24.  Status post second HD yesterday with 2.3 L UF, tolerated well.  Plan for HD today.  She will need outpatient HD arrangement, discussed with renal navigator.  #Dialysis Access: Left upper extremity AV fistula was created by Dr. Donzetta Matters in 04/2019.  Tried to use initially without success and had infiltration.  She developed left arm swelling and tender.  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  Seen by vascular surgeon and underwent ligation of left AV fistula and placement of right IJ TDC.  Follow-up with the surgeon outpatient.  #Acute respiratory failure with hypoxia/shortness of breath: Due to pulmonary edema, ? CAP.  Volume management by dialysis.  On antibiotics for CAP.   #Hypertension/volume up: Continue antihypertensive medication.  Hold losartan.  UF with HD.  Pain is contributing elevated BP and currently n.p.o. for the OR.  #Anemia due to CKD: Iron saturation 5%.  Ordered IV iron and ESA.  Received PRBC.  Monitor hemoglobin.  # CKD-MBD, secondary hyperparathyroidism: Hyperphosphatemia noted.  Started calcium acetate.  #Chronic diastolic heart failure: Cardiology is following.  Subjective: Seen and examined at bedside.  Vascular procedure yesterday.  The pain is much better today.  Denies nausea vomiting chest pain shortness of breath.   Objective Vital signs in last 24 hours: Vitals:   09/24/20 2000 09/24/20 2338 09/25/20 0407 09/25/20 0756  BP: (!) 162/81 (!) 157/81 (!) 175/78 (!) 192/94  Pulse: 65 70 69 77  Resp: 18 17 17 13   Temp: 98.1 F (36.7 C) 99.3 F (37.4 C) 98.9 F (37.2 C) 99 F (37.2 C)  TempSrc: Oral Oral Oral Oral  SpO2: 99% 100% 98% 95%  Weight:      Height:       Weight change:   Intake/Output Summary (Last 24 hours) at 09/25/2020 0816 Last data filed at 09/25/2020 0500 Gross per 24 hour  Intake 950 ml  Output 11 ml  Net 939 ml       Labs: Basic Metabolic Panel: Recent Labs  Lab 09/22/20 1303 09/23/20 0400 09/24/20 0056 09/25/20 0140  NA 139 141 135 138  K 3.5 3.4* 3.5 3.8  CL 105 104 99 100  CO2 18* 24 26 25   GLUCOSE 121* 139* 132* 94  BUN 113* 65* 30* 38*  CREATININE 13.98* 9.54* 5.73* 7.26*  CALCIUM 7.2* 8.2* 8.3* 8.5*  PHOS 8.2*  --   --  6.0*   Liver Function Tests: Recent Labs  Lab 09/21/20 2021 09/22/20 1303 09/25/20 0140  AST 32  --   --   ALT 27  --   --   ALKPHOS 114  --   --   BILITOT 0.8  --   --   PROT 6.8  --   --   ALBUMIN 3.0* 2.5* 2.4*   No results for input(s): LIPASE, AMYLASE in the last 168 hours. No results for input(s): AMMONIA in the  last 168 hours. CBC: Recent Labs  Lab 09/21/20 2021 09/22/20 0447 09/22/20 1250 09/22/20 2023 09/23/20 0400 09/24/20 0056 09/25/20 0140  WBC 8.4 7.0 6.4  --  7.6 8.3 7.8  NEUTROABS 7.0  --   --   --   --   --   --   HGB 7.3* 6.6* 5.5*   < > 8.6* 8.8* 7.8*  HCT 23.4* 21.0* 17.9*   < > 26.0* 26.4* 24.9*  MCV 91.4 90.1 90.9  --  85.2 85.7 88.9  PLT 411* 372 330  --  275 267 300   < > = values in this interval not displayed.   Cardiac Enzymes: No results for input(s): CKTOTAL, CKMB, CKMBINDEX, TROPONINI in the last 168 hours. CBG: Recent Labs  Lab 09/24/20 0610 09/24/20 1026 09/24/20 2116 09/25/20 0615  GLUCAP 114* 108* 153* 105*    Iron Studies:  No results for input(s): IRON, TIBC,  TRANSFERRIN, FERRITIN in the last 72 hours. Studies/Results: DG Fluoro Guide CV Line-No Report  Result Date: 09/24/2020 Fluoroscopy was utilized by the requesting physician.  No radiographic interpretation.    Medications: Infusions: . sodium chloride    . cefTRIAXone (ROCEPHIN)  IV 2 g (09/24/20 2121)  . ferric gluconate (FERRLECIT/NULECIT) IV 250 mg (09/23/20 1513)  . heparin 1,100 Units/hr (09/25/20 0411)    Scheduled Medications: . sodium chloride   Intravenous Once  . amLODipine  10 mg Oral Daily  . calcium acetate  667 mg Oral TID WC  . Chlorhexidine Gluconate Cloth  6 each Topical Q0600  . darbepoetin (ARANESP) injection - DIALYSIS  100 mcg Intravenous Q Mon-HD  . dextromethorphan-guaiFENesin  1 tablet Oral BID  . doxycycline  100 mg Oral Q12H  . furosemide  80 mg Intravenous Q12H  . hydrALAZINE  25 mg Oral BID  . metoprolol succinate  75 mg Oral Daily  . multivitamin  1 tablet Oral QHS  . nitroGLYCERIN  0.2 mg Transdermal Daily  . rosuvastatin  10 mg Oral Daily    have reviewed scheduled and prn medications.  Physical Exam: General:NAD, comfortable Heart:RRR, s1s2 nl Lungs: Clear bilateral, no wheezing Abdomen:soft, Non-tender, non-distended Extremities:No edema Dialysis Access: Ligated left AV fistula, right IJ TDC in place. Eh Sauseda Reesa Chew Keevon Henney 09/25/2020,8:16 AM  LOS: 4 days

## 2020-09-25 NOTE — Progress Notes (Signed)
PROGRESS NOTE    Monique Henderson  KGY:[id number] DOB: Jul 12, 1950 DOA: 09/21/2020 PCP: Maximiano Coss, NP    Brief Narrative:  70 year old female with history of chronic kidney disease stage V not on dialysis, chronic diastolic congestive heart failure, paroxysmal A. fib on Coumadin, thoracic aortic aneurysm repair 2020, hypertension, history of stroke presented to the hospital with cough with yellow sputum, shortness of breath, orthopnea and subjective fever.  In the emergency room blood pressure 190s/110, 87% on room air.  EKG with sinus tachycardia.  Chest x-ray with cardiomegaly and bilateral lower lung airspace opacities.  BUN 114/creatinine 14.2.  Initial hemoglobin 7.3.  She was admitted on oxygen, treated with antibiotics, cardiology and nephrology consulted.  Hemoglobin dropped to 6.6.  Started on hemodialysis.  Patient was started on hemodialysis with her left arm AV fistula that infiltrated giving severe pain.  A temporary HD catheter was placed on the right IJ on 4/24 and received first dialysis.   Assessment & Plan:   Principal Problem:   Acute renal failure superimposed on chronic kidney disease (HCC) Active Problems:   Chronic obstructive lung disease (HCC)   History of CVA (cerebrovascular accident)   AF (paroxysmal atrial fibrillation) (HCC)   Chest pain   Troponin level elevated   Acute respiratory failure with hypoxia ([place])   Community acquired pneumonia   Uremia   Metabolic acidosis   Hypertensive urgency  Acute hypoxemic respiratory failure secondary to volume overload in the setting of renal failure, diastolic dysfunction: Removal of fluid with dialysis and improvement of oxygenation.  Currently mostly on room air.  Mobilize today.  Suspected community-acquired pneumonia: WBC normal.  Procalcitonin 5.2.  Was started on broad-spectrum antibiotics.  Patient receiving ceftriaxone and doxycycline.  Will treat for 5 days of therapy.  Now her left arm is better,  start mobilizing and working with aggressive respiratory therapy and chest physiotherapy.  Blood cultures negative.  Sputum cultures pending.   Acute kidney injury on CKD stage V: Now with ESRD.  With fluid overload.  Acute on chronic anemia of chronic kidney disease. Temporary HD catheter right IJ, received dialysis 4/24, 4/25.  Permacath right IJ 4/26, hemodialysis planned for today.  Hemoglobin 6.6-2 unit PRBC-appropriately responded.  Continue to monitor.  Normal folic acid and W26.  May benefit with IV iron. Infiltrated left upper extremity fistula with severe pain, ligation of fistula by vascular 4/26 with improvement of symptoms.  Paroxysmal A. fib: Currently sinus rhythm.  Cardiology recommended symptomatic treatment.  On Toprol-XL 75 mg daily.  On heparin drip.  Start Coumadin today. If patient able to be discharged, will discharge on Coumadin dosing and he stopped heparin.  Hypertensive urgency: Controlled with resumption of Norvasc, metoprolol, hydralazine.  History of stroke: No new deficits.  On statin and anticoagulation.   Start mobilizing today.  Outpatient dialysis is scheduled to be arranged.  Lives alone.  Will need safety evaluation for home with PT OT before discharge planning.   DVT prophylaxis: Heparin infusion, start Coumadin.   Code Status: Full code Family Communication: None at the bedside, patient talking to her sister. Disposition Plan: Status is: Inpatient  Remains inpatient appropriate because:Persistent severe electrolyte disturbances and IV treatments appropriate due to intensity of illness or inability to take PO   Dispo: The patient is from: Home              Anticipated d/c is to: Home with home health.  Patient currently is not medically stable to d/c.   Difficult to place patient No         Consultants:   Cardiology  Nephrology  Vascular surgery  Procedures:   HD catheter placement 4/24.  Hemodialysis 4/24,  4/25  Antimicrobials:  Anti-infectives (From admission, onward)   Start     Dose/Rate Route Frequency Ordered Stop   09/22/20 2230  azithromycin (ZITHROMAX) 500 mg in sodium chloride 0.9 % 250 mL IVPB  Status:  Discontinued        500 mg 250 mL/hr over 60 Minutes Intravenous Every 24 hours 09/21/20 2313 09/22/20 1211   09/22/20 2200  cefTRIAXone (ROCEPHIN) 2 g in sodium chloride 0.9 % 100 mL IVPB        2 g 200 mL/hr over 30 Minutes Intravenous Every 24 hours 09/21/20 2313 09/26/20 2159   09/22/20 1300  doxycycline (VIBRA-TABS) tablet 100 mg        100 mg Oral Every 12 hours 09/22/20 1211 09/25/20 2359   09/21/20 2215  cefTRIAXone (ROCEPHIN) 1 g in sodium chloride 0.9 % 100 mL IVPB        1 g 200 mL/hr over 30 Minutes Intravenous  Once 09/21/20 2211 09/21/20 2352   09/21/20 2215  azithromycin (ZITHROMAX) 500 mg in sodium chloride 0.9 % 250 mL IVPB        500 mg 250 mL/hr over 60 Minutes Intravenous  Once 09/21/20 2211 09/22/20 0020         Subjective: Patient seen and examined at the bedside.  No overnight events.  Still coughing and a lot of sputum.  Afebrile.  Left arm hurts but better than yesterday.  Going for dialysis today.  Objective: Vitals:   09/24/20 2000 09/24/20 2338 09/25/20 0407 09/25/20 0756  BP: (!) 162/81 (!) 157/81 (!) 175/78 (!) 192/94  Pulse: 65 70 69 77  Resp: 18 17 17 13   Temp: 98.1 F (36.7 C) 99.3 F (37.4 C) 98.9 F (37.2 C) 99 F (37.2 C)  TempSrc: Oral Oral Oral Oral  SpO2: 99% 100% 98% 95%  Weight:      Height:        Intake/Output Summary (Last 24 hours) at 09/25/2020 0923 Last data filed at 09/25/2020 0800 Gross per 24 hour  Intake 961 ml  Output 11 ml  Net 950 ml   Filed Weights   09/22/20 1245 09/23/20 1334 09/23/20 1645  Weight: 50.9 kg 47.3 kg 45 kg    Examination:  General exam: Appears comfortable.  Chronically sick looking.  She is on room air today. Respiratory system: Mostly conducted airway sounds. Cardiovascular  system: S1 & S2 heard, RRR.  No edema. Gastrointestinal system: Abdomen is nondistended, soft and nontender. No organomegaly or masses felt. Normal bowel sounds heard. Central nervous system: Alert and oriented. No focal neurological deficits. Extremities: Edematous left arm with tenderness around the AV fistula, no fluctuation.  Distal neurovascular status intact. Right chest wall permacath present.  Nontender.    Data Reviewed: I have personally reviewed following labs and imaging studies  CBC: Recent Labs  Lab 09/21/20 2021 09/22/20 0447 09/22/20 1250 09/22/20 2023 09/23/20 0400 09/24/20 0056 09/25/20 0140  WBC 8.4 7.0 6.4  --  7.6 8.3 7.8  NEUTROABS 7.0  --   --   --   --   --   --   HGB 7.3* 6.6* 5.5* 8.5* 8.6* 8.8* 7.8*  HCT 23.4* 21.0* 17.9* 25.3* 26.0* 26.4* 24.9*  MCV 91.4 90.1 90.9  --  85.2 85.7 88.9  PLT 411* 372 330  --  275 267 676   Basic Metabolic Panel: Recent Labs  Lab 09/22/20 0447 09/22/20 1303 09/23/20 0400 09/24/20 0056 09/25/20 0140  NA 142 139 141 135 138  K 3.6 3.5 3.4* 3.5 3.8  CL 107 105 104 99 100  CO2 16* 18* 24 26 25   GLUCOSE 111* 121* 139* 132* 94  BUN 113* 113* 65* 30* 38*  CREATININE 13.93* 13.98* 9.54* 5.73* 7.26*  CALCIUM 7.4* 7.2* 8.2* 8.3* 8.5*  PHOS  --  8.2*  --   --  6.0*   GFR: Estimated Creatinine Clearance: 5.3 mL/min (A) (by C-G formula based on SCr of 7.26 mg/dL (H)). Liver Function Tests: Recent Labs  Lab 09/21/20 2021 09/22/20 1303 09/25/20 0140  AST 32  --   --   ALT 27  --   --   ALKPHOS 114  --   --   BILITOT 0.8  --   --   PROT 6.8  --   --   ALBUMIN 3.0* 2.5* 2.4*   No results for input(s): LIPASE, AMYLASE in the last 168 hours. No results for input(s): AMMONIA in the last 168 hours. Coagulation Profile: Recent Labs  Lab 09/21/20 2021  INR 1.1   Cardiac Enzymes: No results for input(s): CKTOTAL, CKMB, CKMBINDEX, TROPONINI in the last 168 hours. BNP (last 3 results) No results for input(s):  PROBNP in the last 8760 hours. HbA1C: No results for input(s): HGBA1C in the last 72 hours. CBG: Recent Labs  Lab 09/24/20 0610 09/24/20 1026 09/24/20 2116 09/25/20 0615  GLUCAP 114* 108* 153* 105*   Lipid Profile: No results for input(s): CHOL, HDL, LDLCALC, TRIG, CHOLHDL, LDLDIRECT in the last 72 hours. Thyroid Function Tests: No results for input(s): TSH, T4TOTAL, FREET4, T3FREE, THYROIDAB in the last 72 hours. Anemia Panel: No results for input(s): VITAMINB12, FOLATE, FERRITIN, TIBC, IRON, RETICCTPCT in the last 72 hours. Sepsis Labs: Recent Labs  Lab 09/21/20 2021 09/21/20 2300 09/22/20 0447 09/23/20 0400  PROCALCITON  --  5.12 5.24 4.26  LATICACIDVEN 1.0  --   --   --     Recent Results (from the past 240 hour(s))  Blood culture (routine single)     Status: None (Preliminary result)   Collection Time: 09/21/20  8:19 PM   Specimen: BLOOD RIGHT ARM  Result Value Ref Range Status   Specimen Description BLOOD RIGHT ARM  Final   Special Requests   Final    BOTTLES DRAWN AEROBIC AND ANAEROBIC Blood Culture results may not be optimal due to an excessive volume of blood received in culture bottles   Culture   Final    NO GROWTH 4 DAYS Performed at [facility], [place] [address] [place], [place] [address]    Report Status PENDING  Incomplete  Urine culture     Status: None   Collection Time: 09/21/20  8:19 PM   Specimen: In/Out Cath Urine  Result Value Ref Range Status   Specimen Description IN/OUT CATH URINE  Final   Special Requests NONE  Final   Culture   Final    NO GROWTH Performed at [facility], [place] [address], [place], [place] [address]    Report Status 09/23/2020 FINAL  Final  Resp Panel by RT-PCR (Flu A&B, Covid) Nasopharyngeal Swab     Status: None   Collection Time: 09/21/20  8:54 PM   Specimen: Nasopharyngeal Swab; Nasopharyngeal(NP) swabs in vial transport medium  Result Value Ref Range Status   SARS Coronavirus 2 by RT PCR NEGATIVE  NEGATIVE Final    Comment: (NOTE) SARS-CoV-2 target nucleic acids are NOT DETECTED.  The SARS-CoV-2 RNA is generally detectable in upper respiratory specimens during the acute phase of infection. The lowest concentration of SARS-CoV-2 viral copies this assay can detect is 138 copies/mL. A negative result does not preclude SARS-Cov-2 infection and should not be used as the sole basis for treatment or other patient management decisions. A negative result may occur with  improper specimen collection/handling, submission of specimen other than nasopharyngeal swab, presence of viral mutation(s) within the areas targeted by this assay, and inadequate number of viral copies(<138 copies/mL). A negative result must be combined with clinical observations, patient history, and epidemiological information. The expected result is Negative.  Fact Sheet for Patients:  EntrepreneurPulse.com.au  Fact Sheet for Healthcare Providers:  IncredibleEmployment.be  This test is no t yet approved or cleared by the [place] FDA and  has been authorized for detection and/or diagnosis of SARS-CoV-2 by FDA under an Emergency Use Authorization (EUA). This EUA will remain  in effect (meaning this test can be used) for the duration of the COVID-19 declaration under Section 564(b)(1) of the Act, 21 U.S.C.section 360bbb-3(b)(1), unless the authorization is terminated  or revoked sooner.       Influenza A by PCR NEGATIVE NEGATIVE Final   Influenza B by PCR NEGATIVE NEGATIVE Final    Comment: (NOTE) The Xpert Xpress SARS-CoV-2/FLU/RSV plus assay is intended as an aid in the diagnosis of influenza from Nasopharyngeal swab specimens and should not be used as a sole basis for treatment. Nasal washings and aspirates are unacceptable for Xpert Xpress SARS-CoV-2/FLU/RSV testing.  Fact Sheet for Patients: EntrepreneurPulse.com.au  Fact Sheet for Healthcare  Providers: IncredibleEmployment.be  This test is not yet approved or cleared by the [place] FDA and has been authorized for detection and/or diagnosis of SARS-CoV-2 by FDA under an Emergency Use Authorization (EUA). This EUA will remain in effect (meaning this test can be used) for the duration of the COVID-19 declaration under Section 564(b)(1) of the Act, 21 U.S.C. section 360bbb-3(b)(1), unless the authorization is terminated or revoked.  Performed at [facility], [place] [address], [place], [place] [address]   MRSA PCR Screening     Status: None   Collection Time: 09/22/20  2:02 AM   Specimen: Nasal Mucosa; Nasopharyngeal  Result Value Ref Range Status   MRSA by PCR NEGATIVE NEGATIVE Final    Comment:        The GeneXpert MRSA Assay (FDA approved for NASAL specimens only), is one component of a comprehensive MRSA colonization surveillance program. It is not intended to diagnose MRSA infection nor to guide or monitor treatment for MRSA infections. Performed at [facility], [place] [address], [place], [place] [address]          Radiology Studies: DG Fluoro Guide CV Line-No Report  Result Date: 09/24/2020 Fluoroscopy was utilized by the requesting physician.  No radiographic interpretation.        Scheduled Meds: . sodium chloride   Intravenous Once  . amLODipine  10 mg Oral Daily  . calcium acetate  667 mg Oral TID WC  . Chlorhexidine Gluconate Cloth  6 each Topical Q0600  . darbepoetin (ARANESP) injection - DIALYSIS  100 mcg Intravenous Q Mon-HD  . dextromethorphan-guaiFENesin  1 tablet Oral BID  . doxycycline  100 mg Oral Q12H  . hydrALAZINE  25  mg Oral BID  . metoprolol succinate  75 mg Oral Daily  . multivitamin  1 tablet Oral QHS  . nitroGLYCERIN  0.2 mg Transdermal Daily  . rosuvastatin  10 mg Oral Daily  . warfarin  9 mg Oral ONCE-1600  . Warfarin - Pharmacist Dosing Inpatient   Does not apply q1600   Continuous  Infusions: . sodium chloride    . cefTRIAXone (ROCEPHIN)  IV 2 g (09/24/20 2121)  . ferric gluconate (FERRLECIT/NULECIT) IV 250 mg (09/23/20 1513)  . heparin 1,100 Units/hr (09/25/20 0411)     LOS: 4 days    Time spent: 30 minutes    Barb Merino, MD [facility] Pager [phone]

## 2020-09-25 NOTE — Progress Notes (Signed)
Minimal bleeding noted on the  HD cath on right subclavian, dressing changed. Continue to monitor.

## 2020-09-25 NOTE — Progress Notes (Signed)
Received a call from microbiology stating that sputum sent for culture is unacceptable and it needs to be repeated.

## 2020-09-25 NOTE — Progress Notes (Signed)
Back from HD by bed awake and alert. °

## 2020-09-26 LAB — CBC
HCT: 25.2 % — ABNORMAL LOW (ref 36.0–46.0)
Hemoglobin: 8.1 g/dL — ABNORMAL LOW (ref 12.0–15.0)
MCH: 28.5 pg (ref 26.0–34.0)
MCHC: 32.1 g/dL (ref 30.0–36.0)
MCV: 88.7 fL (ref 80.0–100.0)
Platelets: 300 10*3/uL (ref 150–400)
RBC: 2.84 MIL/uL — ABNORMAL LOW (ref 3.87–5.11)
RDW: 15.9 % — ABNORMAL HIGH (ref 11.5–15.5)
WBC: 9.3 10*3/uL (ref 4.0–10.5)
nRBC: 0.4 % — ABNORMAL HIGH (ref 0.0–0.2)

## 2020-09-26 LAB — CULTURE, BLOOD (SINGLE): Culture: NO GROWTH

## 2020-09-26 LAB — GLUCOSE, CAPILLARY
Glucose-Capillary: 109 mg/dL — ABNORMAL HIGH (ref 70–99)
Glucose-Capillary: 119 mg/dL — ABNORMAL HIGH (ref 70–99)

## 2020-09-26 LAB — PROTIME-INR
INR: 1.1 (ref 0.8–1.2)
Prothrombin Time: 14.2 seconds (ref 11.4–15.2)

## 2020-09-26 LAB — HEPARIN LEVEL (UNFRACTIONATED): Heparin Unfractionated: 0.59 IU/mL (ref 0.30–0.70)

## 2020-09-26 MED ORDER — CHLORHEXIDINE GLUCONATE CLOTH 2 % EX PADS
6.0000 | MEDICATED_PAD | Freq: Every day | CUTANEOUS | Status: DC
  Administered 2020-09-26 – 2020-09-27 (×2): 6 via TOPICAL

## 2020-09-26 MED ORDER — WARFARIN SODIUM 7.5 MG PO TABS
7.5000 mg | ORAL_TABLET | Freq: Once | ORAL | Status: AC
  Administered 2020-09-26: 7.5 mg via ORAL
  Filled 2020-09-26: qty 1

## 2020-09-26 MED ORDER — KIDNEY FAILURE BOOK: Freq: Once | Status: AC

## 2020-09-26 MED ORDER — NEPRO/CARBSTEADY PO LIQD
237.0000 mL | Freq: Two times a day (BID) | ORAL | Status: DC
  Administered 2020-09-26: 237 mL via ORAL

## 2020-09-26 NOTE — Evaluation (Signed)
Occupational Therapy Evaluation Patient Details Name: Monique Henderson MRN: [id number] DOB: 06-Aug-1950 Today's Date: 09/26/2020    History of Present Illness 70 yo admitted 4/23 with SOB, HTN and respiratory failure due to volume overload from acute renal failure on CKD. 4/24 1st HD. PMhx: CKD, CHF, AFib, thoracic aortic anerysm repair, HTN, CVA   Clinical Impression   Limited session due to earlier bleeding of new subclavian dialysis site. Pt with stable VS (HR in mid 80s) during demonstration of bed mobility, EOB ADL and transfer to/from [facility]. She is overall functioning at a set up to supervision level. Recommend ADL with nursing staff. No further OT needs.     Follow Up Recommendations  No OT follow up    Equipment Recommendations  None recommended by OT    Recommendations for Other Services       Precautions / Restrictions Precautions Precaution Comments: watch sats and HR      Mobility Bed Mobility Overal bed mobility: Independent             General bed mobility comments: HOB flat    Transfers Overall transfer level: Needs assistance Equipment used: None Transfers: Sit to/from Stand Sit to Stand: Supervision         General transfer comment: supervision for safety    Balance Overall balance assessment: Mild deficits observed, not formally tested                                         ADL either performed or assessed with clinical judgement   ADL                                         General ADL Comments: Overall functioning at a set up to supervision level in ADL and ADL transfers.     Vision Patient Visual Report: No change from baseline       Perception     Praxis      Pertinent Vitals/Pain Pain Assessment: No/denies pain     Hand Dominance Right   Extremity/Trunk Assessment Upper Extremity Assessment Upper Extremity Assessment: RUE deficits/detail RUE Deficits / Details: not assessed due to  subclavian graft site having been bleeding earlier   Lower Extremity Assessment Lower Extremity Assessment: Defer to PT evaluation   Cervical / Trunk Assessment Cervical / Trunk Assessment: Normal   Communication Communication Communication: No difficulties   Cognition Arousal/Alertness: Awake/alert Behavior During Therapy: WFL for tasks assessed/performed Overall Cognitive Status: Within Functional Limits for tasks assessed                                     General Comments       Exercises     Shoulder Instructions      Home Living Family/patient expects to be discharged to:: Private residence Living Arrangements: Alone Available Help at Discharge: Family;Available PRN/intermittently Type of Home: Apartment Home Access: Stairs to enter Entrance Stairs-Number of Steps: 4   Home Layout: Two level Alternate Level Stairs-Number of Steps: stair lift up to bedroom   Bathroom Shower/Tub: Teacher, early years/pre: Standard     Home Equipment: Environmental consultant - 2 wheels;Cane - single point  Prior Functioning/Environment Level of Independence: Independent                 OT Problem List: Decreased strength      OT Treatment/Interventions:      OT Goals(Current goals can be found in the care plan section) Acute Rehab OT Goals Patient Stated Goal: return home  OT Frequency:     Barriers to D/C:            Co-evaluation              AM-PAC OT "6 Clicks" Daily Activity     Outcome Measure Help from another person eating meals?: None Help from another person taking care of personal grooming?: None Help from another person toileting, which includes using toliet, bedpan, or urinal?: None Help from another person bathing (including washing, rinsing, drying)?: None Help from another person to put on and taking off regular upper body clothing?: None Help from another person to put on and taking off regular lower body clothing?:  None 6 Click Score: 24   End of Session Nurse Communication: Mobility status  Activity Tolerance: Patient tolerated treatment well Patient left: in bed;with call bell/phone within reach  OT Visit Diagnosis: Muscle weakness (generalized) (M62.81)                Time: 1415-1430 OT Time Calculation (min): 15 min Charges:  OT General Charges $OT Visit: 1 Visit OT Evaluation $OT Eval Low Complexity: 1 Low  Nestor Lewandowsky, OTR/L Acute Rehabilitation Services Pager: [phone] Office: [phone]  [name] 09/26/2020, 3:05 PM

## 2020-09-26 NOTE — Progress Notes (Signed)
Rounded on patient today in correlation to transition to restarting outpatient HD. Patient found sitting in her recliner. Ordered consult to dietician and Kidney Failure book. Patient educated at the bedside regarding care of tunneled dialysis catheter, AV fistula/graft site care, assessment of thrill daily and proper medication administration on HD days.   Patient also educated on the importance of adhering to scheduled dialysis treatments, the effects of fluid overload, hyperkalemia and hyperphosphatemia. Patient reports that she frequently eats ice from the store. Reiterated to patient the importance of fluid balance. With information on given on what she may have to prevent this. Patient is agreeable. Patient capable of re-verbalizing via teach back method. Also educated patient on services available through the interdisciplinary team in the clinic setting as she follows with Dr. [place] Mealy. Patient with no further questions at this time. Handouts and contact information provided to patient for any further assistance. Will follow as appropriate.   Dorthey Sawyer, RN  Dialysis Nurse Coordinator Phone: [phone]

## 2020-09-26 NOTE — Progress Notes (Signed)
ANTICOAGULATION CONSULT NOTE - Follow Up Consult  Pharmacy Consult for heparin Indication: atrial fibrillation  Labs: Recent Labs    09/23/20 0400 09/23/20 0430 09/24/20 0056 09/25/20 0140 09/25/20 1200 09/25/20 1417 09/26/20 0006  HGB 8.6*  --  8.8* 7.8*  --   --  8.1*  HCT 26.0*  --  26.4* 24.9*  --   --  25.2*  PLT 275  --  267 300  --   --  300  LABPROT  --   --   --   --   --   --  14.2  INR  --   --   --   --   --   --  1.1  HEPARINUNFRC  --    < > 0.30 0.11* >1.10* 0.37 0.59  CREATININE 9.54*  --  5.73* 7.26*  --   --   --    < > = values in this interval not displayed.    Assessment/Plan:  70yo female remains therapeutic on heparin. Will continue gtt at current rate of 1100 units/hr and monitor daily level.   Wynona Neat, PharmD, BCPS  09/26/2020,12:34 AM

## 2020-09-26 NOTE — Progress Notes (Signed)
Patient has been accepted at [facility] on a TTS schedule with a seat time of 12:00pm. She needs to arrive to her appointments at 11:40am.  Per Attending, patient will discharge tomorrow after some more mobility today. Per conversation with Nephrologist/Dr. Carolin Sicks, patient is cleared to have her next HD treatment in the clinic on Saturday, 09/28/20. In order for patient to start on Saturday, she needs to go to the clinic after discharge tomorrow, before 4:00pm to sign her consents for treatment. Attending and Nephrologist aware. Navigator has requested that Renal PA send orders to clinic. Navigator met with patient to provide HD seat schedule to patient verbally and in writing. This is the same clinic patient went to when she needed treatment for AKI. She understands that she will need to go to the clinic tomorrow to sign papers and initially states that she will drive herself. Navigator suggests that maybe a family member would take her since she will just be getting discharged from the hospital. She agrees to have Navigator call her sister, Lurlene/[phone], who did not answer the phone. Navigator left message. Patient states she will call her again and speak with her son, also.  Patient was able to use teach back method to say that she needs to sign papers tomorrow and be at her clinic at 11:40am on Saturday for first treatment.  Alphonzo Cruise, [place] Renal Navigator [phone]

## 2020-09-26 NOTE — Progress Notes (Signed)
Green for Heparin + warfarin Indication: atrial fibrillation  Allergies  Allergen Reactions  . Shrimp [Shellfish Allergy] Shortness Of Breath  . Bactroban [Mupirocin] Other (See Comments)    "Sores in nose"  . Vicodin [Hydrocodone-Acetaminophen] Itching and Nausea And Vomiting    This is patient's home medication  . Eliquis [Apixaban] Rash  . Lisinopril Cough  . Tylenol [Acetaminophen] Itching    Patient Measurements: Height: 5\' 3"  (160 cm) Weight: 42 kg (92 lb 9.5 oz) IBW/kg (Calculated) : 52.4  Vital Signs: Temp: 98 F (36.7 C) (04/28 0758) Temp Source: Oral (04/28 0758) BP: 192/113 (04/28 0758) Pulse Rate: 87 (04/28 0758)  Labs: Recent Labs    09/24/20 0056 09/25/20 0140 09/25/20 1200 09/25/20 1417 09/26/20 0006  HGB 8.8* 7.8*  --   --  8.1*  HCT 26.4* 24.9*  --   --  25.2*  PLT 267 300  --   --  300  LABPROT  --   --   --   --  14.2  INR  --   --   --   --  1.1  HEPARINUNFRC 0.30 0.11* >1.10* 0.37 0.59  CREATININE 5.73* 7.26*  --   --   --     Estimated Creatinine Clearance: 4.9 mL/min (A) (by C-G formula based on SCr of 7.26 mg/dL (H)).   Medical History: Past Medical History:  Diagnosis Date  . AF (paroxysmal atrial fibrillation) ([place]) 05/29/2019  . Aortic atherosclerosis ([place]) 07/05/2019  . Aortic dissection ([place]) 04/04/2019  . Atelectasis 2002   Bilateral  . Atrial fibrillation with RVR ([place]) 05/18/2019  . Atypical chest pain 07/05/2019  . Bone spur 2008   Right calcaneal foot spur  . Breast cancer ([place]) 2004   Ductal carcinoma in situ of the left breast; S/P left partial mastectomy 02/26/2003; S/P re-excision of cranial and lateral margins11/18/2004.radiation  . Breast cancer ([place]) 09/21/2012   right breast/ last radiation treatment 03/22/2013  . Cerebral thrombosis with cerebral infarction 05/22/2019  . Chronic kidney disease, stage IV (severe) ([place]) 10/10/2007  . Chronic low back pain 06/22/2016  . Chronic  obstructive lung disease ([place]) 01/16/2017  . CKD (chronic kidney disease), stage IV ([place]) 07/19/2017  . 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 Autumn Messing; S/P re-excision of superior and inferior margins 10/27/2012.   . Dissection of aorta ([place]) 04/03/2019  . Elevated troponin 08/28/2019  . ESRD on dialysis ([place]) 05/29/2019  . Essential hypertension 09/16/2006  . GERD 09/16/2006  . GERD (gastroesophageal reflux disease)   . H/O: stroke 05/29/2019  . 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 Hot flashes   . Hx of radiation therapy 2005   left breast  . Hx of radiation therapy 01/11/13- 03/22/13   right breast 5760 cGy 30 sessions  . Hyperkalemia 08/28/2019  . Hypertension   . Insomnia 03/14/2015  . Low back pain   . Lumbar spinal stenosis    S/P lumbar decompressive laminectomy, fusion and plating for lumbar spinal stensosis  . Malnutrition of moderate degree 05/19/2019  . Non compliance with medical treatment 12/04/2017  . Normocytic anemia    With thrombocytosis  . On Coumadin for atrial fibrillation ([place]) 10/03/2019  . Osteoarthritis   . Paroxysmal SVT (supraventricular tachycardia) ([place]) 08/27/2019  . Personal history of radiation therapy   . Pleural effusion 05/18/2019  . Positive D dimer 08/28/2019  .  Right ureteral stone 2002  . S/P aortic aneurysm repair 04/07/2019  . S/P lumbar spinal fusion 01/18/2014   S/P lumbar decompressive laminectomy, fusion, and plating for lumbar spinal stenosis on 05/27/2009 by Dr. Eustace Moore.  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 Eustace Moore.    . Shortness of breath    from pain  . SIRS (systemic inflammatory response syndrome) ([place]) 08/27/2019  . SVT (supraventricular  tachycardia) ([place]) 08/28/2019  . Tobacco use disorder 04/19/2009  . Uterine fibroid   . Wears dentures    top    Medications:  No current facility-administered medications on file prior to encounter.   Current Outpatient Medications on File Prior to Encounter  Medication Sig Dispense Refill  . albuterol (VENTOLIN HFA) 108 (90 Base) MCG/ACT inhaler Inhale 2 puffs into the lungs every 6 (six) hours as needed for wheezing or shortness of breath. 8.5 g 0  . amLODipine (NORVASC) 10 MG tablet TAKE 1 TABLET (10 MG TOTAL) BY MOUTH DAILY. 90 tablet 0  . amoxicillin (AMOXIL) 500 MG capsule Take 1,000 mg by mouth 2 (two) times daily. For 7 days    . baclofen (LIORESAL) 10 MG tablet Take 10 mg by mouth 2 (two) times daily as needed for muscle spasms.    . buprenorphine (BUTRANS) 10 MCG/HR PTWK Place 1 patch onto the skin once a week.    . cloNIDine (CATAPRES - DOSED IN MG/24 HR) 0.3 mg/24hr patch Place 1 patch (0.3 mg total) onto the skin once a week. 4 patch 2  . cloNIDine (CATAPRES) 0.1 MG tablet Take 0.1 mg by mouth 2 (two) times daily as needed.    . cyclobenzaprine (FLEXERIL) 5 MG tablet Take 5 mg by mouth 3 (three) times daily as needed for muscle spasms.    . furosemide (LASIX) 20 MG tablet Take 1 tablet (20 mg total) by mouth daily. 60 tablet 5  . hydrALAZINE (APRESOLINE) 25 MG tablet Take 1 tablet (25 mg total) by mouth 2 (two) times daily.    Marland Kitchen loperamide (IMODIUM) 2 MG capsule Take 2 mg by mouth 2 (two) times daily as needed for diarrhea or loose stools.    Marland Kitchen losartan (COZAAR) 100 MG tablet Take 100 mg by mouth at bedtime.    . metoprolol succinate (TOPROL-XL) 50 MG 24 hr tablet Take 1 tablet (50 mg total) by mouth daily. Take with or immediately following a meal. 90 tablet 1  . metoprolol tartrate (LOPRESSOR) 50 MG tablet Take 50 mg by mouth 2 (two) times daily.    . mirtazapine (REMERON) 7.5 MG tablet Take 1 tablet (7.5 mg total) by mouth at bedtime. 90 tablet 3  . NARCAN 4 MG/0.1ML LIQD  nasal spray kit Place 1 spray into the nose as needed for opioid reversal. If no response within 3 minutes then repeat once in the other nostril.  Call 911.    . ondansetron (ZOFRAN) 4 MG tablet Take 1 tablet (4 mg total) by mouth every 8 (eight) hours as needed for nausea or vomiting. 20 tablet 3  . oxyCODONE-acetaminophen (PERCOCET) 10-325 MG tablet Take 1 tablet by mouth 3 (three) times daily as needed for severe pain.    . predniSONE (DELTASONE) 20 MG tablet Take 20 mg by mouth 2 (two) times daily. For 5 days    . rosuvastatin (CRESTOR) 10  MG tablet TAKE 1 TABLET (10 MG TOTAL) BY MOUTH DAILY. 30 tablet 0  . warfarin (COUMADIN) 6 MG tablet TAKE 1 TABLET (6 MG TOTAL) BY MOUTH 4 (FOUR) TIMES A WEEK. 30 tablet 0  . warfarin (COUMADIN) 7.5 MG tablet Take 1 tablet (7.5 mg total) by mouth 3 (three) times a week. 90 tablet 0     Assessment: 25 yoF with hx AFib and CVA on warfarin PTA held for heparin while procedures needed. Pharmacy asked to resume warfarin and continue heparin drip for now with hx CVA.   Heparin level therapeutic this morning, INR 1.1 as expected, CBC stable.  *Home warfarin dose 7.992m MWF, 6592mTRSS.  Goal of Therapy:  Heparin level 0.3-0.7 units/mL INR 2-3 Monitor platelets by anticoagulation protocol: Yes   Plan:  -Heparin 1100 units/h -Warfarin 7.92m892m1 tonight -Daily INR, heparin level, CBC   MicArrie SenateharmD, BCPJoyCCOlney Endoscopy Center LLCinical Pharmacist [phone] check AMION for all [facility] [place]mbers 09/26/2020

## 2020-09-26 NOTE — Progress Notes (Signed)
Patient bleeding significant amount from newly placed HD cath, right subclavian, I have paged IV team stat, advised Dr Laurena Bering, and paged Dr Stanford Breed, ([facility])

## 2020-09-26 NOTE — Plan of Care (Signed)
  Problem: Education: Goal: Knowledge of General Education information will improve Description: Including pain rating scale, medication(s)/side effects and non-pharmacologic comfort measures Outcome: Progressing   Problem: Health Behavior/Discharge Planning: Goal: Ability to manage health-related needs will improve Outcome: Progressing   Problem: Clinical Measurements: Goal: Will remain free from infection Outcome: Progressing Goal: Respiratory complications will improve Outcome: Progressing   

## 2020-09-26 NOTE — Progress Notes (Signed)
This nurse discussed site care with Jonelle Sidle RN regarding site. She reports bleeding has slowed. This nurse instructed nurse to continue to monitor and apply pressure as necessary. If bleeding continues nurse is to contact MD and notify and order clotting drsg as indicated. And consult VAST as needed. Nurse VU. Fran Lowes, RN VAST

## 2020-09-26 NOTE — Evaluation (Signed)
Physical Therapy Evaluation Patient Details Name: Monique Henderson MRN: [id number] DOB: 1950/08/16 Today's Date: 09/26/2020   History of Present Illness  70 yo admitted 4/23 with SOB, HTN and respiratory failure due to volume overload from acute renal failure on CKD. 4/24 1st HD. PMhx: CKD, CHF, AFib, thoracic aortic anerysm repair, HTN, CVA  Clinical Impression  Pt reports living alone, caring for herself and currently having abdominal pain. Pt with episodes of SVT total of 3x during session with transition to sitting and one episode supine. Each lasting grossly 2 min prior to return to NSR. Pt with drop in sats to 83% during these episodes on RA as well. Pt with limited gait tolerance at baseline stating she walks to dumpster and mailbox and only to get groceries 1x/month. Pt with decreased activity tolerance secondary to cardiopulmonary function this date who will benefit from acute therapy to maximize mobility and safety for return home.   135/80 (98) sitting EOB HR 92-145    Follow Up Recommendations Home health PT    Equipment Recommendations  None recommended by PT    Recommendations for Other Services       Precautions / Restrictions Precautions Precautions: Fall;Other (comment) Precaution Comments: watch sats and HR      Mobility  Bed Mobility Overal bed mobility: Modified Independent                  Transfers Overall transfer level: Needs assistance   Transfers: Sit to/from Stand Sit to Stand: Supervision         General transfer comment: supervision for HR. Pt transitioned from supine to sit x 2 with SVT grossly 2 min with each transfer to 145 then transition back to NSR  Ambulation/Gait Ambulation/Gait assistance: Supervision Gait Distance (Feet): 240 Feet Assistive device: None Gait Pattern/deviations: Step-through pattern;Decreased stride length   Gait velocity interpretation: 1.31 - 2.62 ft/sec, indicative of limited community  ambulator General Gait Details: pt with steady gait with limited tolerance for distance. HR 99 during gait with SpO2 88-92% on RA  Stairs            Wheelchair Mobility    Modified Rankin (Stroke Patients Only)       Balance Overall balance assessment: Mild deficits observed, not formally tested                                           Pertinent Vitals/Pain Pain Assessment: 0-10 Pain Score: 5  Pain Location: abdomen Pain Descriptors / Indicators: Aching;Guarding Pain Intervention(s): Limited activity within patient's tolerance;Monitored during session;Repositioned    Home Living Family/patient expects to be discharged to:: Private residence Living Arrangements: Alone Available Help at Discharge: Available PRN/intermittently Type of Home: Apartment Home Access: Stairs to enter   [employer] of Steps: 4 Home Layout: Two level Home Equipment: Environmental consultant - 2 wheels;Cane - single point      Prior Function Level of Independence: Independent               Hand Dominance        Extremity/Trunk Assessment   Upper Extremity Assessment Upper Extremity Assessment: Generalized weakness    Lower Extremity Assessment Lower Extremity Assessment: Generalized weakness    Cervical / Trunk Assessment Cervical / Trunk Assessment: Normal  Communication   Communication: No difficulties  Cognition Arousal/Alertness: Awake/alert Behavior During Therapy: WFL for tasks assessed/performed Overall Cognitive Status: Within  Functional Limits for tasks assessed                                        General Comments      Exercises     Assessment/Plan    PT Assessment Patient needs continued PT services  PT Problem List Decreased mobility;Decreased activity tolerance;Cardiopulmonary status limiting activity       PT Treatment Interventions Gait training;Stair training;Functional mobility training;Patient/family  education;Therapeutic exercise;Therapeutic activities    PT Goals (Current goals can be found in the Care Plan section)  Acute Rehab PT Goals Patient Stated Goal: return home PT Goal Formulation: With patient Time For Goal Achievement: 10/10/20 Potential to Achieve Goals: Fair    Frequency Min 3X/week   Barriers to discharge Decreased caregiver support      Co-evaluation               AM-PAC PT "6 Clicks" Mobility  Outcome Measure Help needed turning from your back to your side while in a flat bed without using bedrails?: None Help needed moving from lying on your back to sitting on the side of a flat bed without using bedrails?: None Help needed moving to and from a bed to a chair (including a wheelchair)?: None Help needed standing up from a chair using your arms (e.g., wheelchair or bedside chair)?: A Little Help needed to walk in hospital room?: A Little Help needed climbing 3-5 steps with a railing? : A Little 6 Click Score: 21    End of Session   Activity Tolerance: Patient tolerated treatment well Patient left: in chair;with call bell/phone within reach Nurse Communication: Mobility status PT Visit Diagnosis: Other abnormalities of gait and mobility (R26.89)    Time: 5916-3846 PT Time Calculation (min) (ACUTE ONLY): 32 min   Charges:   PT Evaluation $PT Eval Moderate Complexity: 1 Mod PT Treatments $Therapeutic Activity: 8-22 mins        Jameila Keeny P, PT Acute Rehabilitation Services Pager: [phone] Office: [place] 09/26/2020, 10:05 AM

## 2020-09-26 NOTE — Progress Notes (Addendum)
Initial Nutrition Assessment  DOCUMENTATION CODES:   Underweight,Non-severe (moderate) malnutrition in context of chronic illness  INTERVENTION:   -Nepro Shake po BID, each supplement provides 425 kcal and 19 grams protein -Renal MVI daily  NUTRITION DIAGNOSIS:   Moderate Malnutrition related to chronic illness (ESRD on HD) as evidenced by percent weight loss,energy intake < or equal to 75% for > or equal to 1 month,mild muscle depletion,moderate muscle depletion,edema.  GOAL:   Patient will meet greater than or equal to 90% of their needs  MONITOR:   PO intake,Supplement acceptance,Labs,Weight trends,Skin,I & O's  REASON FOR ASSESSMENT:   Other (Comment)    ASSESSMENT:   ABENA ERDMAN is a 70 y.o. female with medical history significant for CKD 5 not on dialysis in the past year, chronic diastolic CHF, paroxysmal atrial fibrillation on warfarin, thoracic aortic aneurysm repair in 2020, hypertension, and history of CVA, now presenting to the emergency department for evaluation of chest pain, productive cough, subjective fever, and shortness of breath  Pt admitted with acute renal failure superimposed on CKD V, uremia, and metabolic acidosis.   4/24- s/p Procedure:      L jugular venogram, R EJ HD CVC placement chronic L innom vein occlusion; s/p HD 4/26- s/p  PROCEDURE: #1 right IJ tunneled hemodialysis catheter, #2 ligation of left AV fistula  Reviewed I/O's: -2.6 L x 24 hours and -3.9 L since admission  UOP: 251 ml x 24 hours  Case discussed with RN, who reports pt is eating and drinking fairly. Plan for HD either today or tomorrow.   Spoke with pt at bedside, who reports that she has experienced a general decline in health over the past year, but has been feeling stronger recently due to eat more and gaining weight. Per pt, she typically consumes 2 meals per day (foods such as chicken, corn, rice, broccoli, and green beans). She does not eat foods such as tomatoes,  oranges, and bananas. Pt shares that she has not been eating much during hospitalization secondary to sore throat, which she attributes to fistula placement- she has mainly been consuming vegetables and fruits cups while she has been here. Noted meal completion 25-90%.   Per pt, her UBW is around 136#. She estimates she has lost about 30 pounds over thepast year, but recently gained about 4 pounds back due to improved oral intake. Reviewed wt hx; pt has experienced a 10% wt loss over the past 6 months, which is significant for time frame.   Discussed importance of good meal and supplement intake to promote healing. Pt amenable to oral nutrition supplements (does not like chocolate Ensure).   Pt with some edema, which may be masking further weight loss as well as fat and muscle depletions.   Medications reviewed and include phoslo and aranesp.   Labs reviewed: CBGS: 105 (inpatient orders for glycemic control are none).   NUTRITION - FOCUSED PHYSICAL EXAM:  Flowsheet Row Most Recent Value  Orbital Region No depletion  Upper Arm Region No depletion  Thoracic and Lumbar Region No depletion  Buccal Region No depletion  Temple Region Mild depletion  Clavicle Bone Region Mild depletion  Clavicle and Acromion Bone Region No depletion  Scapular Bone Region Mild depletion  Dorsal Hand Mild depletion  Patellar Region Moderate depletion  Anterior Thigh Region Moderate depletion  Posterior Calf Region Moderate depletion  Edema (RD Assessment) Mild  Hair Reviewed  Eyes Reviewed  Mouth Reviewed  Skin Reviewed  Nails Reviewed  Diet Order:   Diet Order            Diet renal/carb modified with fluid restriction Diet-HS Snack? Nothing; Fluid restriction: 1200 mL Fluid; Room service appropriate? Yes; Fluid consistency: Thin  Diet effective now                 EDUCATION NEEDS:   Education needs have been addressed  Skin:  Skin Assessment: Skin Integrity Issues: Skin Integrity  Issues:: Incisions Incisions: closed lt arm, closed rt chest  Last BM:  Unknown  Height:   Ht Readings from Last 1 Encounters:  09/22/20 5\' 3"  (1.6 m)    Weight:   Wt Readings from Last 1 Encounters:  09/25/20 42 kg    Ideal Body Weight:  52.3 kg  BMI:  Body mass index is 16.4 kg/m.  Estimated Nutritional Needs:   Kcal:  1500-1700  Protein:  70-85 grams  Fluid:  1000 ml + UOP    Loistine Chance, RD, LDN, [place] Registered Dietitian II Certified Diabetes Care and Education Specialist Please refer to [facility] for RD and/or RD on-call/weekend/after hours pager

## 2020-09-26 NOTE — Progress Notes (Signed)
[facility] NEPHROLOGY PROGRESS NOTE  Assessment/ Plan: Pt is a 70 y.o. 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  She required dialysis in the past and then had some recovery.  Now, presented with creatinine level 14 with volume overload.  Not responding with IV diuretics.  She now has progression to ESRD therefore initiated dialysis on 4/24.   Received 3 dialysis treatment, last yesterday with 3 L UF.  Now continue MWF schedule. She will need outpatient HD arrangement, discussed with renal navigator.  #Dialysis Access: Left upper extremity AV fistula was created by Dr. Donzetta Matters in 04/2019.  Tried to use initially without success and had infiltration.  She developed left arm swelling and tender.  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  Seen by vascular surgeon and underwent ligation of left AV fistula and placement of right IJ TDC.  Follow-up with the surgeon outpatient.  #Acute respiratory failure with hypoxia/shortness of breath: Due to pulmonary edema, ? CAP.  Volume management by dialysis.  On antibiotics for CAP.   #Hypertension/volume up: Continue antihypertensive medication.  Hold losartan.  UF with HD.  Pain is contributing elevated BP.  #Anemia due to CKD: Iron saturation 5%.  Ordered IV iron and ESA.  Received PRBC.  Monitor hemoglobin.  # CKD-MBD, secondary hyperparathyroidism: Hyperphosphatemia noted.  Started calcium acetate.  #Chronic diastolic heart failure: Cardiology is following.  Subjective: Seen and examined at bedside.  Complaining of some shortness at the site of fistula ligation.  Denies nausea, vomiting, chest pain, shortness of breath.  No new event. Objective Vital  signs in last 24 hours: Vitals:   09/26/20 0400 09/26/20 0618 09/26/20 0740 09/26/20 0758  BP: (!) 203/93 (!) 186/121 (!) 192/113 (!) 192/113  Pulse: 67  83 87  Resp: 12 18 17 18   Temp:    98 F (36.7 C)  TempSrc:    Oral  SpO2: 96%  94% 100%  Weight:      Height:       Weight change:   Intake/Output Summary (Last 24 hours) at 09/26/2020 0812 Last data filed at 09/26/2020 0800 Gross per 24 hour  Intake 781.65 ml  Output 3251 ml  Net -2469.35 ml       Labs: Basic Metabolic Panel: Recent Labs  Lab 09/22/20 1303 09/23/20 0400 09/24/20 0056 09/25/20 0140  NA 139 141 135 138  K 3.5 3.4* 3.5 3.8  CL 105 104 99 100  CO2 18* 24 26 25   GLUCOSE 121* 139* 132* 94  BUN 113* 65* 30* 38*  CREATININE 13.98* 9.54* 5.73* 7.26*  CALCIUM 7.2* 8.2* 8.3* 8.5*  PHOS 8.2*  --   --  6.0*   Liver Function Tests: Recent Labs  Lab 09/21/20 2021 09/22/20 1303 09/25/20 0140  AST 32  --   --   ALT 27  --   --   ALKPHOS 114  --   --   BILITOT 0.8  --   --   PROT 6.8  --   --   ALBUMIN 3.0* 2.5* 2.4*   No results for input(s): LIPASE, AMYLASE in the last 168 hours. No results for input(s): AMMONIA in the last 168 hours. CBC: Recent Labs  Lab 09/21/20 2021 09/22/20 0447 09/22/20  1250 09/22/20 2023 09/23/20 0400 09/24/20 0056 09/25/20 0140 09/26/20 0006  WBC 8.4   < > 6.4  --  7.6 8.3 7.8 9.3  NEUTROABS 7.0  --   --   --   --   --   --   --   HGB 7.3*   < > 5.5*   < > 8.6* 8.8* 7.8* 8.1*  HCT 23.4*   < > 17.9*   < > 26.0* 26.4* 24.9* 25.2*  MCV 91.4   < > 90.9  --  85.2 85.7 88.9 88.7  PLT 411*   < > 330  --  275 267 300 300   < > = values in this interval not displayed.   Cardiac Enzymes: No results for input(s): CKTOTAL, CKMB, CKMBINDEX, TROPONINI in the last 168 hours. CBG: Recent Labs  Lab 09/24/20 0610 09/24/20 1026 09/24/20 2116 09/25/20 0615  GLUCAP 114* 108* 153* 105*    Iron Studies:  No results for input(s): IRON, TIBC, TRANSFERRIN, FERRITIN in the  last 72 hours. Studies/Results: DG Fluoro Guide CV Line-No Report  Result Date: 09/24/2020 Fluoroscopy was utilized by the requesting physician.  No radiographic interpretation.    Medications: Infusions: . sodium chloride    . ferric gluconate (FERRLECIT/NULECIT) IV 250 mg (09/25/20 1551)  . heparin 1,100 Units/hr (09/25/20 1545)    Scheduled Medications: . sodium chloride   Intravenous Once  . amLODipine  10 mg Oral Daily  . calcium acetate  667 mg Oral TID WC  . Chlorhexidine Gluconate Cloth  6 each Topical Q0600  . darbepoetin (ARANESP) injection - DIALYSIS  100 mcg Intravenous Q Mon-HD  . dextromethorphan-guaiFENesin  1 tablet Oral BID  . hydrALAZINE  25 mg Oral BID  . metoprolol succinate  75 mg Oral Daily  . multivitamin  1 tablet Oral QHS  . nitroGLYCERIN  0.2 mg Transdermal Daily  . rosuvastatin  10 mg Oral Daily  . Warfarin - Pharmacist Dosing Inpatient   Does not apply q1600    have reviewed scheduled and prn medications.  Physical Exam: General:NAD, able to lie flat comfortable Heart:RRR, s1s2 nl Lungs: Clear bilateral, no wheezing Abdomen:soft, Non-tender, non-distended Extremities:No edema Dialysis Access: Ligated left AV fistula, right IJ TDC in place. Pinchus Weckwerth Tanna Furry 09/26/2020,8:12 AM  LOS: 5 days

## 2020-09-26 NOTE — Progress Notes (Signed)
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 fib on Coumadin, thoracic aortic aneurysm repair 2020, hypertension, history of stroke presented to the hospital with cough with yellow sputum, shortness of breath, orthopnea and subjective fever.  In the emergency room blood pressure 190s/110, 87% on room air.  EKG with sinus tachycardia.  Chest x-ray with cardiomegaly and bilateral lower lung airspace opacities.  BUN 114/creatinine 14.2.  Initial hemoglobin 7.3.  She was admitted on oxygen, treated with antibiotics, cardiology and nephrology consulted.  Hemoglobin dropped to 6.6.  Started on hemodialysis.  Patient was started on hemodialysis with her left arm AV fistula that infiltrated giving severe pain.  A temporary HD catheter was placed on the right IJ on 4/24 and received first dialysis.   Assessment & Plan:   Principal Problem:   Acute renal failure superimposed on chronic kidney disease (HCC) Active Problems:   Chronic obstructive lung disease (HCC)   History of CVA (cerebrovascular accident)   AF (paroxysmal atrial fibrillation) (HCC)   Chest pain   Troponin level elevated   Acute respiratory failure with hypoxia ([place])   Community acquired pneumonia   Uremia   Metabolic acidosis   Hypertensive urgency  Acute hypoxemic respiratory failure secondary to volume overload in the setting of renal failure, diastolic dysfunction: Removal of fluid with dialysis and improvement of oxygenation.  Improved to room air.  Suspected community-acquired pneumonia: WBC normal.  Procalcitonin 5.2.  Was started on broad-spectrum antibiotics.  Patient receiving ceftriaxone and doxycycline.  Will treat for 5 days of therapy.  Completing therapy in the hospital. Now her left arm is  better, start mobilizing and working with aggressive respiratory therapy and chest physiotherapy.  Blood cultures negative.  Sputum cultures pending.   Acute kidney injury on CKD stage V: Now with ESRD.  With fluid overload.  Acute on chronic anemia of chronic kidney disease. Temporary HD catheter right IJ, received dialysis 4/24, 4/25.  Permacath right IJ 4/26, hemodialysis planned for today.  Hemoglobin 6.6-2 unit PRBC-appropriately responded.  Continue to monitor.  Normal folic acid and O27.  May benefit with IV iron. Infiltrated left upper extremity fistula with severe pain, ligation of fistula by vascular 4/26 with improvement of symptoms.  Paroxysmal A. fib: Currently sinus rhythm.  Cardiology recommended symptomatic treatment.  On Toprol-XL 75 mg daily.  On heparin drip.  Coumadin started 4/27. If patient able to be discharged, will discharge on Coumadin dosing and stop heparin.  Hypertensive urgency: Controlled with resumption of Norvasc, metoprolol, hydralazine.  History of stroke: No new deficits.  On statin and anticoagulation.   Working with PT OT.  Discharge home when outpatient hemodialysis is available.   DVT prophylaxis: Heparin infusion, Coumadin   Code Status: Full code Family Communication: None at the bedside, patient talking to her sister. Disposition Plan: Status is: Inpatient  Remains inpatient appropriate because:Persistent severe electrolyte disturbances and IV treatments appropriate due to intensity of illness or inability to take PO   Dispo: The patient is from: Home              Anticipated d/c is to: Home with home health.              Patient currently is not medically stable to d/c.   Difficult to place patient No  Consultants:   Cardiology  Nephrology  Vascular surgery  Procedures:   HD catheter placement 4/24.  Hemodialysis 4/24, 4/25  Antimicrobials:  Anti-infectives (From admission, onward)   Start     Dose/Rate Route  Frequency Ordered Stop   09/22/20 2230  azithromycin (ZITHROMAX) 500 mg in sodium chloride 0.9 % 250 mL IVPB  Status:  Discontinued        500 mg 250 mL/hr over 60 Minutes Intravenous Every 24 hours 09/21/20 2313 09/22/20 1211   09/22/20 2200  cefTRIAXone (ROCEPHIN) 2 g in sodium chloride 0.9 % 100 mL IVPB        2 g 200 mL/hr over 30 Minutes Intravenous Every 24 hours 09/21/20 2313 09/25/20 2238   09/22/20 1300  doxycycline (VIBRA-TABS) tablet 100 mg        100 mg Oral Every 12 hours 09/22/20 1211 09/25/20 2209   09/21/20 2215  cefTRIAXone (ROCEPHIN) 1 g in sodium chloride 0.9 % 100 mL IVPB        1 g 200 mL/hr over 30 Minutes Intravenous  Once 09/21/20 2211 09/21/20 2352   09/21/20 2215  azithromycin (ZITHROMAX) 500 mg in sodium chloride 0.9 % 250 mL IVPB        500 mg 250 mL/hr over 60 Minutes Intravenous  Once 09/21/20 2211 09/22/20 0020         Subjective: Patient seen and examined.  Today complain of some nausea.  She was attempting to work with physical therapy, tachycardic on attempted mobility.  Heart rate occasionally goes to 150 but not sustained.  Was able to eat some breakfast, nausea after that.  Left arm is sore but better than before.  Objective: Vitals:   09/26/20 0618 09/26/20 0740 09/26/20 0758 09/26/20 0905  BP: (!) 186/121 (!) 192/113 (!) 192/113   Pulse:  83 87 (!) 120  Resp: 18 17 18    Temp:   98 F (36.7 C)   TempSrc:   Oral   SpO2:  94% 100% 99%  Weight:      Height:        Intake/Output Summary (Last 24 hours) at 09/26/2020 0954 Last data filed at 09/26/2020 0800 Gross per 24 hour  Intake 770.65 ml  Output 3251 ml  Net -2480.35 ml   Filed Weights   09/23/20 1645 09/25/20 1310 09/25/20 1645  Weight: 45 kg 45 kg 42 kg    Examination:  General exam: Appears comfortable.  Frail and debilitated.  Comfortable at rest. Respiratory system: No added sounds. Cardiovascular system: S1 & S2 heard, irregularly irregular.  Tachycardic. Gastrointestinal  system: Abdomen is nondistended, soft and nontender. No organomegaly or masses felt. Normal bowel sounds heard. Central nervous system: Alert and oriented. No focal neurological deficits. Extremities: Edema and some tenderness left arm with tenderness around the AV fistula, no fluctuation.  Distal neurovascular status intact. Right chest wall permacath present.  Nontender.    Data Reviewed: I have personally reviewed following labs and imaging studies  CBC: Recent Labs  Lab 09/21/20 2021 09/22/20 0447 09/22/20 1250 09/22/20 2023 09/23/20 0400 09/24/20 0056 09/25/20 0140 09/26/20 0006  WBC 8.4   < > 6.4  --  7.6 8.3 7.8 9.3  NEUTROABS 7.0  --   --   --   --   --   --   --   HGB 7.3*   < > 5.5* 8.5* 8.6* 8.8* 7.8* 8.1*  HCT 23.4*   < > 17.9* 25.3* 26.0* 26.4* 24.9* 25.2*  MCV 91.4   < >  90.9  --  85.2 85.7 88.9 88.7  PLT 411*   < > 330  --  275 267 300 300   < > = values in this interval not displayed.   Basic Metabolic Panel: Recent Labs  Lab 09/22/20 0447 09/22/20 1303 09/23/20 0400 09/24/20 0056 09/25/20 0140  NA 142 139 141 135 138  K 3.6 3.5 3.4* 3.5 3.8  CL 107 105 104 99 100  CO2 16* 18* 24 26 25   GLUCOSE 111* 121* 139* 132* 94  BUN 113* 113* 65* 30* 38*  CREATININE 13.93* 13.98* 9.54* 5.73* 7.26*  CALCIUM 7.4* 7.2* 8.2* 8.3* 8.5*  PHOS  --  8.2*  --   --  6.0*   GFR: Estimated Creatinine Clearance: 4.9 mL/min (A) (by C-G formula based on SCr of 7.26 mg/dL (H)). Liver Function Tests: Recent Labs  Lab 09/21/20 2021 09/22/20 1303 09/25/20 0140  AST 32  --   --   ALT 27  --   --   ALKPHOS 114  --   --   BILITOT 0.8  --   --   PROT 6.8  --   --   ALBUMIN 3.0* 2.5* 2.4*   No results for input(s): LIPASE, AMYLASE in the last 168 hours. No results for input(s): AMMONIA in the last 168 hours. Coagulation Profile: Recent Labs  Lab 09/21/20 2021 09/26/20 0006  INR 1.1 1.1   Cardiac Enzymes: No results for input(s): CKTOTAL, CKMB, CKMBINDEX, TROPONINI  in the last 168 hours. BNP (last 3 results) No results for input(s): PROBNP in the last 8760 hours. HbA1C: No results for input(s): HGBA1C in the last 72 hours. CBG: Recent Labs  Lab 09/24/20 0610 09/24/20 1026 09/24/20 2116 09/25/20 0615  GLUCAP 114* 108* 153* 105*   Lipid Profile: No results for input(s): CHOL, HDL, LDLCALC, TRIG, CHOLHDL, LDLDIRECT in the last 72 hours. Thyroid Function Tests: No results for input(s): TSH, T4TOTAL, FREET4, T3FREE, THYROIDAB in the last 72 hours. Anemia Panel: No results for input(s): VITAMINB12, FOLATE, FERRITIN, TIBC, IRON, RETICCTPCT in the last 72 hours. Sepsis Labs: Recent Labs  Lab 09/21/20 2021 09/21/20 2300 09/22/20 0447 09/23/20 0400  PROCALCITON  --  5.12 5.24 4.26  LATICACIDVEN 1.0  --   --   --     Recent Results (from the past 240 hour(s))  Blood culture (routine single)     Status: None   Collection Time: 09/21/20  8:19 PM   Specimen: BLOOD RIGHT ARM  Result Value Ref Range Status   Specimen Description BLOOD RIGHT ARM  Final   Special Requests   Final    BOTTLES DRAWN AEROBIC AND ANAEROBIC Blood Culture results may not be optimal due to an excessive volume of blood received in culture bottles   Culture   Final    NO GROWTH 5 DAYS Performed at [facility], [place] [address] [place], [place] [address]    Report Status 09/26/2020 FINAL  Final  Urine culture     Status: None   Collection Time: 09/21/20  8:19 PM   Specimen: In/Out Cath Urine  Result Value Ref Range Status   Specimen Description IN/OUT CATH URINE  Final   Special Requests NONE  Final   Culture   Final    NO GROWTH Performed at [facility], [place] [address] [place], [place] [address]    Report Status 09/23/2020 FINAL  Final  Resp Panel by RT-PCR (Flu A&B, Covid) Nasopharyngeal Swab  Status: None   Collection Time: 09/21/20  8:54 PM   Specimen: Nasopharyngeal Swab; Nasopharyngeal(NP) swabs in vial transport medium  Result Value Ref  Range Status   SARS Coronavirus 2 by RT PCR NEGATIVE NEGATIVE Final    Comment: (NOTE) SARS-CoV-2 target nucleic acids are NOT DETECTED.  The SARS-CoV-2 RNA is generally detectable in upper respiratory specimens during the acute phase of infection. The lowest concentration of SARS-CoV-2 viral copies this assay can detect is 138 copies/mL. A negative result does not preclude SARS-Cov-2 infection and should not be used as the sole basis for treatment or other patient management decisions. A negative result may occur with  improper specimen collection/handling, submission of specimen other than nasopharyngeal swab, presence of viral mutation(s) within the areas targeted by this assay, and inadequate number of viral copies(<138 copies/mL). A negative result must be combined with clinical observations, patient history, and epidemiological information. The expected result is Negative.  Fact Sheet for Patients:  EntrepreneurPulse.com.au  Fact Sheet for Healthcare Providers:  IncredibleEmployment.be  This test is no t yet approved or cleared by the [place] FDA and  has been authorized for detection and/or diagnosis of SARS-CoV-2 by FDA under an Emergency Use Authorization (EUA). This EUA will remain  in effect (meaning this test can be used) for the duration of the COVID-19 declaration under Section 564(b)(1) of the Act, 21 U.S.C.section 360bbb-3(b)(1), unless the authorization is terminated  or revoked sooner.       Influenza A by PCR NEGATIVE NEGATIVE Final   Influenza B by PCR NEGATIVE NEGATIVE Final    Comment: (NOTE) The Xpert Xpress SARS-CoV-2/FLU/RSV plus assay is intended as an aid in the diagnosis of influenza from Nasopharyngeal swab specimens and should not be used as a sole basis for treatment. Nasal washings and aspirates are unacceptable for Xpert Xpress SARS-CoV-2/FLU/RSV testing.  Fact Sheet for  Patients: EntrepreneurPulse.com.au  Fact Sheet for Healthcare Providers: IncredibleEmployment.be  This test is not yet approved or cleared by the [place] FDA and has been authorized for detection and/or diagnosis of SARS-CoV-2 by FDA under an Emergency Use Authorization (EUA). This EUA will remain in effect (meaning this test can be used) for the duration of the COVID-19 declaration under Section 564(b)(1) of the Act, 21 U.S.C. section 360bbb-3(b)(1), unless the authorization is terminated or revoked.  Performed at [facility], [place] [address] [place], [place] [address]   MRSA PCR Screening     Status: None   Collection Time: 09/22/20  2:02 AM   Specimen: Nasal Mucosa; Nasopharyngeal  Result Value Ref Range Status   MRSA by PCR NEGATIVE NEGATIVE Final    Comment:        The GeneXpert MRSA Assay (FDA approved for NASAL specimens only), is one component of a comprehensive MRSA colonization surveillance program. It is not intended to diagnose MRSA infection nor to guide or monitor treatment for MRSA infections. Performed at [facility], [place] [address] [place], [place] [address]   Expectorated Sputum Assessment w Gram Stain, Rflx to Resp Cult     Status: None   Collection Time: 09/25/20  9:01 AM   Specimen: Expectorated Sputum  Result Value Ref Range Status   Specimen Description EXPECTORATED SPUTUM  Final   Special Requests NONE  Final   Sputum evaluation   Final    Sputum specimen not acceptable for testing.  Please recollect.   Gram Stain Report Called to,Read Back By and Verified With: Verdene Rio RN, AT [phone]  09/25/20 [place] Performed at [facility], [place] [address], [place], [place] [address]    Report Status 09/25/2020 FINAL  Final         Radiology Studies: DG Fluoro Guide CV Line-No Report  Result Date: 09/24/2020 Fluoroscopy was utilized by the requesting physician.  No radiographic  interpretation.        Scheduled Meds: . sodium chloride   Intravenous Once  . amLODipine  10 mg Oral Daily  . calcium acetate  667 mg Oral TID WC  . Chlorhexidine Gluconate Cloth  6 each Topical Q0600  . Chlorhexidine Gluconate Cloth  6 each Topical Q0600  . darbepoetin (ARANESP) injection - DIALYSIS  100 mcg Intravenous Q Mon-HD  . dextromethorphan-guaiFENesin  1 tablet Oral BID  . hydrALAZINE  25 mg Oral BID  . metoprolol succinate  75 mg Oral Daily  . multivitamin  1 tablet Oral QHS  . nitroGLYCERIN  0.2 mg Transdermal Daily  . rosuvastatin  10 mg Oral Daily  . warfarin  7.5 mg Oral ONCE-1600  . Warfarin - Pharmacist Dosing Inpatient   Does not apply q1600   Continuous Infusions: . sodium chloride    . ferric gluconate (FERRLECIT/NULECIT) IV 250 mg (09/25/20 1551)  . heparin Stopped (09/26/20 0915)     LOS: 5 days    Time spent: 30 minutes    Barb Merino, MD [facility] Pager [phone]

## 2020-09-27 DIAGNOSIS — N184 Chronic kidney disease, stage 4 (severe): Secondary | ICD-10-CM

## 2020-09-27 LAB — CBC
HCT: 24.2 % — ABNORMAL LOW (ref 36.0–46.0)
Hemoglobin: 7.7 g/dL — ABNORMAL LOW (ref 12.0–15.0)
MCH: 28.4 pg (ref 26.0–34.0)
MCHC: 31.8 g/dL (ref 30.0–36.0)
MCV: 89.3 fL (ref 80.0–100.0)
Platelets: 368 10*3/uL (ref 150–400)
RBC: 2.71 MIL/uL — ABNORMAL LOW (ref 3.87–5.11)
RDW: 15.9 % — ABNORMAL HIGH (ref 11.5–15.5)
WBC: 9.4 10*3/uL (ref 4.0–10.5)
nRBC: 0.5 % — ABNORMAL HIGH (ref 0.0–0.2)

## 2020-09-27 LAB — PROTIME-INR
INR: 1.2 (ref 0.8–1.2)
Prothrombin Time: 15.3 seconds — ABNORMAL HIGH (ref 11.4–15.2)

## 2020-09-27 MED ORDER — WARFARIN SODIUM 7.5 MG PO TABS: 7.5000 mg | ORAL_TABLET | Freq: Once | ORAL | Status: DC

## 2020-09-27 MED ORDER — CALCIUM ACETATE (PHOS BINDER) 667 MG PO CAPS: 667.0000 mg | ORAL_CAPSULE | Freq: Three times a day (TID) | ORAL | 0 refills | Status: AC

## 2020-09-27 MED ORDER — CHLORHEXIDINE GLUCONATE CLOTH 2 % EX PADS
6.0000 | MEDICATED_PAD | Freq: Every day | CUTANEOUS | Status: DC
  Administered 2020-09-27: 6 via TOPICAL

## 2020-09-27 NOTE — Progress Notes (Signed)
[place] for warfarin Indication: atrial fibrillation  Allergies  Allergen Reactions  . Shrimp [Shellfish Allergy] Shortness Of Breath  . Bactroban [Mupirocin] Other (See Comments)    "Sores in nose"  . Vicodin [Hydrocodone-Acetaminophen] Itching and Nausea And Vomiting    This is patient's home medication  . Eliquis [Apixaban] Rash  . Lisinopril Cough  . Tylenol [Acetaminophen] Itching    Patient Measurements: Height: 5\' 3"  (160 cm) Weight: 42 kg (92 lb 9.5 oz) IBW/kg (Calculated) : 52.4  Vital Signs: Temp: 98.1 F (36.7 C) (04/29 0737) Temp Source: Oral (04/29 0737) BP: 177/97 (04/29 0341) Pulse Rate: 69 (04/29 0341)  Labs: Recent Labs    09/25/20 0140 09/25/20 1200 09/25/20 1417 09/26/20 0006 09/27/20 0125  HGB 7.8*  --   --  8.1* 7.7*  HCT 24.9*  --   --  25.2* 24.2*  PLT 300  --   --  300 368  LABPROT  --   --   --  14.2 15.3*  INR  --   --   --  1.1 1.2  HEPARINUNFRC 0.11* >1.10* 0.37 0.59  --   CREATININE 7.26*  --   --   --   --     Estimated Creatinine Clearance: 4.9 mL/min (A) (by C-G formula based on SCr of 7.26 mg/dL (H)).   Medical History: Past Medical History:  Diagnosis Date  . AF (paroxysmal atrial fibrillation) ([place]) 05/29/2019  . Aortic atherosclerosis ([place]) 07/05/2019  . Aortic dissection ([place]) 04/04/2019  . Atelectasis 2002   Bilateral  . Atrial fibrillation with RVR ([place]) 05/18/2019  . Atypical chest pain 07/05/2019  . Bone spur 2008   Right calcaneal foot spur  . Breast cancer ([place]) 2004   Ductal carcinoma in situ of the left breast; S/P left partial mastectomy 02/26/2003; S/P re-excision of cranial and lateral margins11/18/2004.radiation  . Breast cancer ([place]) 09/21/2012   right breast/ last radiation treatment 03/22/2013  . Cerebral thrombosis with cerebral infarction 05/22/2019  . Chronic kidney disease, stage IV (severe) ([place]) 10/10/2007  . Chronic low back pain 06/22/2016  . Chronic  obstructive lung disease ([place]) 01/16/2017  . CKD (chronic kidney disease), stage IV ([place]) 07/19/2017  . 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 Autumn Messing; S/P re-excision of superior and inferior margins 10/27/2012.   . Dissection of aorta ([place]) 04/03/2019  . Elevated troponin 08/28/2019  . ESRD on dialysis ([place]) 05/29/2019  . Essential hypertension 09/16/2006  . GERD 09/16/2006  . GERD (gastroesophageal reflux disease)   . H/O: stroke 05/29/2019  . 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 Hot flashes   . Hx of radiation therapy 2005   left breast  . Hx of radiation therapy 01/11/13- 03/22/13   right breast 5760 cGy 30 sessions  . Hyperkalemia 08/28/2019  . Hypertension   . Insomnia 03/14/2015  . Low back pain   . Lumbar spinal stenosis    S/P lumbar decompressive laminectomy, fusion and plating for lumbar spinal stensosis  . Malnutrition of moderate degree 05/19/2019  . Non compliance with medical treatment 12/04/2017  . Normocytic anemia    With thrombocytosis  . On Coumadin for atrial fibrillation ([place]) 10/03/2019  . Osteoarthritis   . Paroxysmal SVT (supraventricular tachycardia) ([place]) 08/27/2019  . Personal history of radiation therapy   . Pleural effusion 05/18/2019  . Positive D dimer 08/28/2019  . Right  ureteral stone 2002  . S/P aortic aneurysm repair 04/07/2019  . S/P lumbar spinal fusion 01/18/2014   S/P lumbar decompressive laminectomy, fusion, and plating for lumbar spinal stenosis on 05/27/2009 by Dr. Eustace Moore.  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 Eustace Moore.    . Shortness of breath    from pain  . SIRS (systemic inflammatory response syndrome) ([place]) 08/27/2019  . SVT (supraventricular  tachycardia) ([place]) 08/28/2019  . Tobacco use disorder 04/19/2009  . Uterine fibroid   . Wears dentures    top   Assessment: 52 yoF with hx AFib and CVA on warfarin PTA held for heparin while procedures needed. Pharmacy asked to resume warfarin for now with hx CVA.   Bleeding around catheter site yesterday is resolved, heparin was discontinued at that time. INR slowly trended up to 1.2 after two boosted doses of warfarin.   *Home warfarin dose 7.5mg  MWF, 6mg  TRSS.  Goal of Therapy:  Heparin level 0.3-0.7 units/mL INR 2-3 Monitor platelets by anticoagulation protocol: Yes   Plan:  -Warfarin 7.5mg  x1 tonight  If home today would resume previous dose home dose of warfarin 7.5mg  MWF, 6mg  TRSS.   Erin Hearing PharmD., BCPS Clinical Pharmacist 09/27/2020 8:23 AM

## 2020-09-27 NOTE — Progress Notes (Signed)
CM  to call her  to give the name of the agency for home PT.

## 2020-09-27 NOTE — Progress Notes (Signed)
All set to go home, awaiting for the ride home.

## 2020-09-27 NOTE — Care Management (Signed)
[facility] turned down, faxed order to [facility] for PT services

## 2020-09-27 NOTE — Progress Notes (Signed)
[facility] NEPHROLOGY PROGRESS NOTE  Assessment/ Plan: Pt is a 70 y.o. 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  She required dialysis in the past and then had some recovery.  Now, presented with creatinine level 14 with volume overload.  Not responding with IV diuretics.  She now has progression to ESRD therefore initiated dialysis on 4/24.   Tolerating dialysis well.  OP center arranged at [facility] TTS schedule, plan for next HD tomorrow outpatient.  #Dialysis Access: Left upper extremity AV fistula was created by Dr. Donzetta Matters in 04/2019.  Tried to use initially without success and had infiltration.  She developed left arm swelling and tender.  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  Seen by vascular surgeon and underwent ligation of left AV fistula and placement of right IJ TDC.  Follow-up with the surgeon outpatient.  There was some bleeding around the catheter site yesterday now resolved.  #Acute respiratory failure with hypoxia/shortness of breath: Due to pulmonary edema, ? CAP.  Volume management by dialysis.  On antibiotics for CAP.   #Hypertension/volume up: Continue antihypertensive medication.  Hold losartan.  UF with HD.  Pain is contributing elevated BP.  #Anemia due to CKD and acute blood loss: Iron saturation 5%.  Ordered IV iron and ESA.  Received PRBC.  Monitor hemoglobin.  # CKD-MBD, secondary hyperparathyroidism: Hyperphosphatemia noted.  Started calcium acetate.  #Chronic diastolic heart failure: Cardiology is following.  Ok to discharge from renal perspective.  Subjective: Seen and examined at bedside.  Denies nausea, vomiting, chest pain, shortness of  breath.  Understands about outpatient HD arrangement.  Some shortness at the AV fistula ligation site.  No bleeding from the catheter site this morning.  Objective Vital signs in last 24 hours: Vitals:   09/26/20 1954 09/26/20 2315 09/27/20 0341 09/27/20 0737  BP: (!) 161/86 (!) 176/91 (!) 177/97   Pulse: 76 77 69   Resp: 16 17 19    Temp: 98.7 F (37.1 C) 98.7 F (37.1 C) 98.8 F (37.1 C) 98.1 F (36.7 C)  TempSrc: Oral Oral Oral Oral  SpO2: 99% 99% 97%   Weight:      Height:       Weight change:   Intake/Output Summary (Last 24 hours) at 09/27/2020 0817 Last data filed at 09/26/2020 1741 Gross per 24 hour  Intake 480 ml  Output 50 ml  Net 430 ml       Labs: Basic Metabolic Panel: Recent Labs  Lab 09/22/20 1303 09/23/20 0400 09/24/20 0056 09/25/20 0140  NA 139 141 135 138  K 3.5 3.4* 3.5 3.8  CL 105 104 99 100  CO2 18* 24 26 25   GLUCOSE 121* 139* 132* 94  BUN 113* 65* 30* 38*  CREATININE 13.98* 9.54* 5.73* 7.26*  CALCIUM 7.2* 8.2* 8.3* 8.5*  PHOS 8.2*  --   --  6.0*   Liver Function Tests: Recent Labs  Lab 09/21/20 2021 09/22/20 1303 09/25/20 0140  AST 32  --   --   ALT 27  --   --   ALKPHOS 114  --   --   BILITOT 0.8  --   --   PROT 6.8  --   --   ALBUMIN 3.0*  2.5* 2.4*   No results for input(s): LIPASE, AMYLASE in the last 168 hours. No results for input(s): AMMONIA in the last 168 hours. CBC: Recent Labs  Lab 09/21/20 2021 09/22/20 0447 09/23/20 0400 09/24/20 0056 09/25/20 0140 09/26/20 0006 09/27/20 0125  WBC 8.4   < > 7.6 8.3 7.8 9.3 9.4  NEUTROABS 7.0  --   --   --   --   --   --   HGB 7.3*   < > 8.6* 8.8* 7.8* 8.1* 7.7*  HCT 23.4*   < > 26.0* 26.4* 24.9* 25.2* 24.2*  MCV 91.4   < > 85.2 85.7 88.9 88.7 89.3  PLT 411*   < > 275 267 300 300 368   < > = values in this interval not displayed.   Cardiac Enzymes: No results for input(s): CKTOTAL, CKMB, CKMBINDEX, TROPONINI in the last 168 hours. CBG: Recent Labs  Lab 09/24/20 1026  09/24/20 2116 09/25/20 0615 09/26/20 1114 09/26/20 1638  GLUCAP 108* 153* 105* 109* 119*    Iron Studies:  No results for input(s): IRON, TIBC, TRANSFERRIN, FERRITIN in the last 72 hours. Studies/Results: No results found.  Medications: Infusions: . sodium chloride      Scheduled Medications: . sodium chloride   Intravenous Once  . amLODipine  10 mg Oral Daily  . calcium acetate  667 mg Oral TID WC  . Chlorhexidine Gluconate Cloth  6 each Topical Q0600  . Chlorhexidine Gluconate Cloth  6 each Topical Q0600  . darbepoetin (ARANESP) injection - DIALYSIS  100 mcg Intravenous Q Mon-HD  . dextromethorphan-guaiFENesin  1 tablet Oral BID  . feeding supplement (NEPRO CARB STEADY)  237 mL Oral BID BM  . hydrALAZINE  25 mg Oral BID  . metoprolol succinate  75 mg Oral Daily  . multivitamin  1 tablet Oral QHS  . nitroGLYCERIN  0.2 mg Transdermal Daily  . rosuvastatin  10 mg Oral Daily  . Warfarin - Pharmacist Dosing Inpatient   Does not apply q1600    have reviewed scheduled and prn medications.  Physical Exam: General:NAD, able to lie flat comfortable Heart:RRR, s1s2 nl Lungs: Clear bilateral, no wheezing Abdomen:soft, Non-tender, non-distended Extremities:No edema Dialysis Access: Ligated left AV fistula, right IJ TDC in place, some gauze without any active bleeding. Monique Henderson Monique Henderson 09/27/2020,8:17 AM  LOS: 6 days

## 2020-09-27 NOTE — Discharge Instructions (Signed)

## 2020-09-27 NOTE — Progress Notes (Signed)
Discharged home accompanied by son, belongings taken home  ?

## 2020-09-27 NOTE — Care Management Important Message (Signed)
Important Message  Patient Details  Name: Monique Henderson MRN: [id number] Date of Birth: 1950/06/10   Medicare Important Message Given:  Yes     Memory Argue 09/27/2020, 1:06 PM

## 2020-09-27 NOTE — Care Management (Signed)
Home Health (Order [id number]) Nursing Date: 09/27/2020 Department: [facility] Ordering/Authorizing: Barb Merino, MD    Barb Merino, MD NPI: [id number]      Patient Information  Patient Name  Monique Henderson, Monique Henderson Legal Sex  Female DOB  10-08-1950 SSN  [id number]   Order Information  Order Date/Time Release Date/Time Start Date/Time End Date/Time  09/27/20 09:30 AM None 09/27/20 09:30 AM Until Specified   Order History Inpatient Date/Time Action Taken User Additional Information  09/27/20 0930 Sign Barb Merino, MD   09/27/20 0930 Release Instance Barb Merino, MD (auto-released) Released Order: [id number]  09/27/20 1223 Acknowledge Thyra Breed, RN New Order   Order Questions  Question Answer  To provide the following care/treatments PT

## 2020-09-27 NOTE — Discharge Summary (Signed)
Physician Discharge Summary  Monique Henderson WNI:[id number] DOB: 1950-10-07 DOA: 09/21/2020  PCP: Maximiano Coss, NP  Admit date: 09/21/2020 Discharge date: 09/27/2020  Admitted From: Home Disposition: Home with home health  Recommendations for Outpatient Follow-up:  1. Follow up with PCP in 1-2 weeks 2. Please obtain BMP/CBC in one week during dialysis 3. Go to hemodialysis tomorrow as scheduled.  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 fib on Coumadin, thoracic aortic aneurysm repair 2020, hypertension, history of stroke presented to the hospital with cough with yellow sputum, shortness of breath, orthopnea and subjective fever.  In the emergency room blood pressure 190s/110, 87% on room air.  EKG with sinus tachycardia.  Chest x-ray with cardiomegaly and bilateral lower lung airspace opacities.  BUN 114/creatinine 14.2.  Initial hemoglobin 7.3.  She was admitted on oxygen, treated with antibiotics, cardiology and nephrology consulted.  Hemoglobin dropped to 6.6.  Started on hemodialysis. Patient was started on hemodialysis with her left arm AV fistula that infiltrated giving severe pain.  A temporary HD catheter was placed on the right IJ on 4/24 and received first dialysis.  Progression to ESRD with history of CKD stage V: With fluid overload.  Acute on chronic anemia of chronic kidney disease. Temporary HD catheter right IJ, received dialysis 4/24, 4/25.  Permacath right IJ 4/26, hemodialysis 4/28.  Next dialysis as outpatient 4/30. Hemoglobin 6.6-2 unit PRBC-appropriately responded.  Continue to monitor.  Normal folic acid and F81.  Infiltrated left upper extremity fistula with severe pain, ligation of fistula by vascular 4/26  with improvement of symptoms. Patient had some permacath bleeding 4/28, hemoglobin 7.7.  At about her baseline. Bleeding stopped.  Will need close monitoring, she will go to hemodialysis tomorrow where it will be assessed.  Keep dressing intact.  Acute hypoxemic respiratory failure, multifactorial including committee acquired pneumonia and fluid overload: Resolved.  On room air.  Completed 5 days of antibiotic therapy.  Paroxysmal A. fib: On sinus rhythm now.  Cardiology recommended symptomatic treatment.  Bridged with heparin.  Restarted on Coumadin now.  INR 1.2.  She will go back on her Coumadin doses.  Patient is on metoprolol 50 mg 2 times a day that will be resumed.  Hypertensive emergency: Resolved.  She has not received clonidine in last 6 days in the hospital, will discontinue.  Keep as needed.  Patient is already on Norvasc, metoprolol and hydralazine.  Patient with good clinical recovery now, she is able to go home today to attend her outpatient dialysis sessions tomorrow.  She will benefit with home health PT.    Discharge Diagnoses:  Principal Problem:   Acute renal failure superimposed on chronic kidney disease (HCC) Active Problems:   Chronic obstructive lung disease (HCC)   History of CVA (cerebrovascular accident)   AF (paroxysmal atrial fibrillation) (HCC)   Chest pain   Troponin level elevated   Acute respiratory failure with hypoxia ([place])   Community acquired pneumonia   Uremia   Metabolic acidosis   Hypertensive urgency    Discharge Instructions  Discharge Instructions    Call MD for:  difficulty breathing, headache or visual disturbances   Complete by: As directed    Call MD for:  redness, tenderness, or signs of infection (pain, swelling, redness, odor or green/yellow discharge around incision site)  Complete by: As directed    Call MD for:  temperature >100.4   Complete by: As directed    Diet - low sodium heart healthy   Complete by: As directed     Increase activity slowly   Complete by: As directed    Leave dressing on - Keep it clean, dry, and intact until clinic visit   Complete by: As directed      Allergies as of 09/27/2020      Reactions   Shrimp [shellfish Allergy] Shortness Of Breath   Bactroban [mupirocin] Other (See Comments)   "Sores in nose"   Vicodin [hydrocodone-acetaminophen] Itching, Nausea And Vomiting   This is patient's home medication   Eliquis [apixaban] Rash   Lisinopril Cough   Tylenol [acetaminophen] Itching      Medication List    STOP taking these medications   amoxicillin 500 MG capsule Commonly known as: AMOXIL   cloNIDine 0.3 mg/24hr patch Commonly known as: CATAPRES - Dosed in mg/24 hr   furosemide 20 MG tablet Commonly known as: LASIX   losartan 100 MG tablet Commonly known as: COZAAR   metoprolol succinate 50 MG 24 hr tablet Commonly known as: TOPROL-XL   predniSONE 20 MG tablet Commonly known as: DELTASONE     TAKE these medications   albuterol 108 (90 Base) MCG/ACT inhaler Commonly known as: VENTOLIN HFA Inhale 2 puffs into the lungs every 6 (six) hours as needed for wheezing or shortness of breath.   amLODipine 10 MG tablet Commonly known as: NORVASC TAKE 1 TABLET (10 MG TOTAL) BY MOUTH DAILY.   baclofen 10 MG tablet Commonly known as: LIORESAL Take 10 mg by mouth 2 (two) times daily as needed for muscle spasms.   buprenorphine 10 MCG/HR Ptwk Commonly known as: BUTRANS Place 1 patch onto the skin once a week.   calcium acetate 667 MG capsule Commonly known as: PHOSLO Take 1 capsule (667 mg total) by mouth 3 (three) times daily with meals.   cloNIDine 0.1 MG tablet Commonly known as: CATAPRES Take 0.1 mg by mouth 2 (two) times daily as needed.   cyclobenzaprine 5 MG tablet Commonly known as: FLEXERIL Take 5 mg by mouth 3 (three) times daily as needed for muscle spasms.   hydrALAZINE 25 MG tablet Commonly known as: APRESOLINE Take 1 tablet (25 mg total) by  mouth 2 (two) times daily.   loperamide 2 MG capsule Commonly known as: IMODIUM Take 2 mg by mouth 2 (two) times daily as needed for diarrhea or loose stools.   metoprolol tartrate 50 MG tablet Commonly known as: LOPRESSOR Take 50 mg by mouth 2 (two) times daily.   mirtazapine 7.5 MG tablet Commonly known as: REMERON Take 1 tablet (7.5 mg total) by mouth at bedtime.   Narcan 4 MG/0.1ML Liqd nasal spray kit Generic drug: naloxone Place 1 spray into the nose as needed for opioid reversal. If no response within 3 minutes then repeat once in the other nostril.  Call 911.   ondansetron 4 MG tablet Commonly known as: ZOFRAN Take 1 tablet (4 mg total) by mouth every 8 (eight) hours as needed for nausea or vomiting.   oxyCODONE-acetaminophen 10-325 MG tablet Commonly known as: PERCOCET Take 1 tablet by mouth 3 (three) times daily as needed for severe pain.   rosuvastatin 10 MG tablet Commonly known as: CRESTOR TAKE 1 TABLET (10 MG TOTAL) BY MOUTH DAILY.   warfarin 7.5 MG tablet Commonly known as: Coumadin Take 1 tablet (7.5 mg total)  by mouth 3 (three) times a week.   warfarin 6 MG tablet Commonly known as: COUMADIN TAKE 1 TABLET (6 MG TOTAL) BY MOUTH 4 (FOUR) TIMES A WEEK.            Discharge Care Instructions  (From admission, onward)         Start     Ordered   09/27/20 0000  Leave dressing on - Keep it clean, dry, and intact until clinic visit        09/27/20 0930          Follow-up Information    Elam Dutch, MD Follow up in 3 week(s).   Specialties: Vascular Surgery, Cardiology Contact information: [address] [place] [address] [phone]              Allergies  Allergen Reactions  . Shrimp [Shellfish Allergy] Shortness Of Breath  . Bactroban [Mupirocin] Other (See Comments)    "Sores in nose"  . Vicodin [Hydrocodone-Acetaminophen] Itching and Nausea And Vomiting    This is patient's home medication  . Eliquis [Apixaban] Rash  .  Lisinopril Cough  . Tylenol [Acetaminophen] Itching    Consultations:  Nephrology  Cardiology  Vascular surgery   Procedures/Studies: IR Venocavagram Svc  Result Date: 09/22/2020 CLINICAL DATA:  Renal failure, progressive. Previously placed dialysis fistula could not be utilized for dialysis and a temporary hemodialysis catheter is requested. EXAM: EXAM LEFT JUGULAR VENOGRAM RIGHT IJ CATHETER PLACEMENT UNDER  FLUOROSCOPIC GUIDANCE ULTRASOUND GUIDANCE FOR VASCULAR ACCESS X 2 TECHNIQUE: The procedure, risks (including but not limited to bleeding, infection, organ damage, pneumothorax), benefits, and alternatives were explained to the patient. Questions regarding the procedure were encouraged and answered. The patient understands and consents to the procedure. Initially, a left-sided approach was selected due to the available catheters. Patency of the left IJ vein was confirmed with ultrasound with image documentation. An appropriate skin site was determined. Skin site was marked. Region was prepped using maximum barrier technique including cap and mask, sterile gown, sterile gloves, large sterile sheet, and Chlorhexidine as cutaneous antisepsis. The region was infiltrated locally with 1% lidocaine. Under real-time ultrasound guidance, the vein was accessed with a 21-gauge micropuncture needle, exchanged over a 018 guidewire for a transitional dilator, through which a 035 guidewire was advanced. This would not pass centrally to the SVC. Venogram was performed. 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 Patency of the right external jugular vein was confirmed with ultrasound with image documentation. An appropriate skin site was determined. Skin site was marked. Region was prepped using maximum barrier technique including cap and mask, sterile gown, sterile gloves, large sterile sheet, and  Chlorhexidine as cutaneous antisepsis. The region was infiltrated locally with 1% lidocaine. Under real-time ultrasound guidance, the vein was accessed with a 21-gauge micropuncture needle, exchanged over a 018 guidewire for a transitional dilator, through which a 035 guidewire was advanced, allowing passage of vascular dilator which allowed advancement of a 20 cm Trialysis catheter. This was positioned with the tip mid right atrium. Spot chest radiograph shows good positioning and no pneumothorax. Catheter was flushed and sutured externally with 0-Prolene sutures. Patient tolerated the procedure well. FLUOROSCOPY TIME:  54 seconds; 32.1 mGy COMPLICATIONS: COMPLICATIONS none IMPRESSION: 1. 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 2. Technically successful right EJ Trialysis catheter placement. Electronically Signed   By:  Lucrezia Europe M.D.   On: 09/22/2020 12:48   IR Fluoro Guide CV Line Right  Result Date: 09/22/2020 CLINICAL DATA:  Renal failure, progressive. Previously placed dialysis fistula could not be utilized for dialysis and a temporary hemodialysis catheter is requested. EXAM: EXAM LEFT JUGULAR VENOGRAM RIGHT IJ CATHETER PLACEMENT UNDER  FLUOROSCOPIC GUIDANCE ULTRASOUND GUIDANCE FOR VASCULAR ACCESS X 2 TECHNIQUE: The procedure, risks (including but not limited to bleeding, infection, organ damage, pneumothorax), benefits, and alternatives were explained to the patient. Questions regarding the procedure were encouraged and answered. The patient understands and consents to the procedure. Initially, a left-sided approach was selected due to the available catheters. Patency of the left IJ vein was confirmed with ultrasound with image documentation. An appropriate skin site was determined. Skin site was marked. Region was prepped using maximum barrier technique including cap and mask, sterile gown, sterile gloves, large  sterile sheet, and Chlorhexidine as cutaneous antisepsis. The region was infiltrated locally with 1% lidocaine. Under real-time ultrasound guidance, the vein was accessed with a 21-gauge micropuncture needle, exchanged over a 018 guidewire for a transitional dilator, through which a 035 guidewire was advanced. This would not pass centrally to the SVC. Venogram was performed. 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 Patency of the right external jugular vein was confirmed with ultrasound with image documentation. An appropriate skin site was determined. Skin site was marked. Region was prepped using maximum barrier technique including cap and mask, sterile gown, sterile gloves, large sterile sheet, and Chlorhexidine as cutaneous antisepsis. The region was infiltrated locally with 1% lidocaine. Under real-time ultrasound guidance, the vein was accessed with a 21-gauge micropuncture needle, exchanged over a 018 guidewire for a transitional dilator, through which a 035 guidewire was advanced, allowing passage of vascular dilator which allowed advancement of a 20 cm Trialysis catheter. This was positioned with the tip mid right atrium. Spot chest radiograph shows good positioning and no pneumothorax. Catheter was flushed and sutured externally with 0-Prolene sutures. Patient tolerated the procedure well. FLUOROSCOPY TIME:  54 seconds; 78.6 mGy COMPLICATIONS: COMPLICATIONS none IMPRESSION: 1. 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 2. Technically successful right EJ Trialysis catheter placement. Electronically Signed   By: Lucrezia Europe M.D.   On: 09/22/2020 12:48   IR US Guide Vasc Access Left  Result Date: 09/22/2020 CLINICAL DATA:  Renal failure, progressive. Previously placed dialysis fistula could not be utilized for  dialysis and a temporary hemodialysis catheter is requested. EXAM: EXAM LEFT JUGULAR VENOGRAM RIGHT IJ CATHETER PLACEMENT UNDER  FLUOROSCOPIC GUIDANCE ULTRASOUND GUIDANCE FOR VASCULAR ACCESS X 2 TECHNIQUE: The procedure, risks (including but not limited to bleeding, infection, organ damage, pneumothorax), benefits, and alternatives were explained to the patient. Questions regarding the procedure were encouraged and answered. The patient understands and consents to the procedure. Initially, a left-sided approach was selected due to the available catheters. Patency of the left IJ vein was confirmed with ultrasound with image documentation. An appropriate skin site was determined. Skin site was marked. Region was prepped using maximum barrier technique including cap and mask, sterile gown, sterile gloves, large sterile sheet, and Chlorhexidine as cutaneous antisepsis. The region was infiltrated locally with 1% lidocaine. Under real-time ultrasound guidance, the vein was accessed with a 21-gauge micropuncture needle, exchanged over a 018 guidewire for a transitional dilator, through which a 035 guidewire was advanced.  This would not pass centrally to the SVC. Venogram was performed. 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 Patency of the right external jugular vein was confirmed with ultrasound with image documentation. An appropriate skin site was determined. Skin site was marked. Region was prepped using maximum barrier technique including cap and mask, sterile gown, sterile gloves, large sterile sheet, and Chlorhexidine as cutaneous antisepsis. The region was infiltrated locally with 1% lidocaine. Under real-time ultrasound guidance, the vein was accessed with a 21-gauge micropuncture needle, exchanged over a 018 guidewire for a transitional dilator, through which a 035 guidewire was advanced, allowing  passage of vascular dilator which allowed advancement of a 20 cm Trialysis catheter. This was positioned with the tip mid right atrium. Spot chest radiograph shows good positioning and no pneumothorax. Catheter was flushed and sutured externally with 0-Prolene sutures. Patient tolerated the procedure well. FLUOROSCOPY TIME:  54 seconds; 84.1 mGy COMPLICATIONS: COMPLICATIONS none IMPRESSION: 1. 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 2. Technically successful right EJ Trialysis catheter placement. Electronically Signed   By: Lucrezia Europe M.D.   On: 09/22/2020 12:48   IR US Guide Vasc Access Right  Result Date: 09/22/2020 CLINICAL DATA:  Renal failure, progressive. Previously placed dialysis fistula could not be utilized for dialysis and a temporary hemodialysis catheter is requested. EXAM: EXAM LEFT JUGULAR VENOGRAM RIGHT IJ CATHETER PLACEMENT UNDER  FLUOROSCOPIC GUIDANCE ULTRASOUND GUIDANCE FOR VASCULAR ACCESS X 2 TECHNIQUE: The procedure, risks (including but not limited to bleeding, infection, organ damage, pneumothorax), benefits, and alternatives were explained to the patient. Questions regarding the procedure were encouraged and answered. The patient understands and consents to the procedure. Initially, a left-sided approach was selected due to the available catheters. Patency of the left IJ vein was confirmed with ultrasound with image documentation. An appropriate skin site was determined. Skin site was marked. Region was prepped using maximum barrier technique including cap and mask, sterile gown, sterile gloves, large sterile sheet, and Chlorhexidine as cutaneous antisepsis. The region was infiltrated locally with 1% lidocaine. Under real-time ultrasound guidance, the vein was accessed with a 21-gauge micropuncture needle, exchanged over a 018 guidewire for a transitional dilator, through which a 035 guidewire was  advanced. This would not pass centrally to the SVC. Venogram was performed. 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 Patency of the right external jugular vein was confirmed with ultrasound with image documentation. An appropriate skin site was determined. Skin site was marked. Region was prepped using maximum barrier technique including cap and mask, sterile gown, sterile gloves, large sterile sheet, and Chlorhexidine as cutaneous antisepsis. The region was infiltrated locally with 1% lidocaine. Under real-time ultrasound guidance, the vein was accessed with a 21-gauge micropuncture needle, exchanged over a 018 guidewire for a transitional dilator, through which a 035 guidewire was advanced, allowing passage of vascular dilator which allowed advancement of a 20 cm Trialysis catheter. This was positioned with the tip mid right atrium. Spot chest radiograph shows good positioning and no pneumothorax. Catheter was flushed and sutured externally with 0-Prolene sutures. Patient tolerated the procedure well. FLUOROSCOPY TIME:  54 seconds; 32.4 mGy COMPLICATIONS: COMPLICATIONS none IMPRESSION: 1. 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 2. Technically successful  right EJ Trialysis catheter placement. Electronically Signed   By: Lucrezia Europe M.D.   On: 09/22/2020 12:48   DG Chest Port 1 View  Result Date: 09/21/2020 CLINICAL DATA:  Shortness of breath, fever EXAM: PORTABLE CHEST 1 VIEW COMPARISON:  Chest radiograph dated 11/30/2019 FINDINGS: The heart is enlarged. Mild-to-moderate bilateral lower lung predominant airspace opacities are noted. A small left pleural effusion may contribute. There is no right pleural effusion. There is no pneumothorax. Degenerative changes are seen in the spine and right shoulder. IMPRESSION:  1. Mild-to-moderate bilateral lower lung predominant airspace opacities likely represent pneumonia. A small left pleural effusion may contribute. 2. Cardiomegaly. Electronically Signed   By: Zerita Boers M.D.   On: 09/21/2020 20:53   DG Fluoro Guide CV Line-No Report  Result Date: 09/24/2020 Fluoroscopy was utilized by the requesting physician.  No radiographic interpretation.   ECHOCARDIOGRAM COMPLETE  Result Date: 09/22/2020    ECHOCARDIOGRAM REPORT   Patient Name:   Monique Henderson Date of Exam: 09/22/2020 Medical Rec #:  [id number]          Height:       63.0 in Accession #:    [id number]         Weight:       108.0 lb Date of Birth:  May 24, 1951           BSA:          1.488 m Patient Age:    70 years           BP:           125/90 mmHg Patient Gender: F                  HR:           96 bpm. Exam Location:  Inpatient Procedure: 2D Echo, Cardiac Doppler and Color Doppler Indications:    R07.9* Chest pain, unspecified  History:        Patient has prior history of Echocardiogram examinations, most                 recent 08/28/2019. Stroke, Arrythmias:Atrial Fibrillation,                 Signs/Symptoms:Shortness of Breath and Chest Pain; Risk                 Factors:Hypertension.  Sonographer:    Bernadene Person RDCS Referring Phys: [id number] ROBIN [place]  1. Apical hypokinesis with overall normal LV function.  2. Left ventricular ejection fraction, by estimation, is 55 to 60%. The left ventricle has normal function. The left ventricle demonstrates regional wall motion abnormalities (see scoring diagram/findings for description). There is moderate left ventricular hypertrophy. Left ventricular diastolic parameters are consistent with Grade II diastolic dysfunction (pseudonormalization).  3. Right ventricular systolic function is normal. The right ventricular size is normal. There is mildly elevated pulmonary artery systolic pressure.  4. Left atrial size was severely dilated.  5. Right atrial  size was mildly dilated.  6. The mitral valve is normal in structure. Mild mitral valve regurgitation. No evidence of mitral stenosis.  7. The aortic valve is tricuspid. Aortic valve regurgitation is not visualized. No aortic stenosis is present.  8. The inferior vena cava is normal in size with greater than 50% respiratory variability, suggesting right atrial pressure of 3 mmHg. FINDINGS  Left Ventricle: Left ventricular ejection fraction, by estimation, is 55 to 60%. The left ventricle has normal function. The left  ventricle demonstrates regional wall motion abnormalities. The left ventricular internal cavity size was normal in size. There is moderate left ventricular hypertrophy. Left ventricular diastolic parameters are consistent with Grade II diastolic dysfunction (pseudonormalization). Right Ventricle: The right ventricular size is normal. Right ventricular systolic function is normal. There is mildly elevated pulmonary artery systolic pressure. The tricuspid regurgitant velocity is 3.24 m/s, and with an assumed right atrial pressure of 3 mmHg, the estimated right ventricular systolic pressure is 00.8 mmHg. Left Atrium: Left atrial size was severely dilated. Right Atrium: Right atrial size was mildly dilated. Pericardium: There is no evidence of pericardial effusion. Mitral Valve: The mitral valve is normal in structure. Mild mitral valve regurgitation. No evidence of mitral valve stenosis. Tricuspid Valve: The tricuspid valve is normal in structure. Tricuspid valve regurgitation is mild . No evidence of tricuspid stenosis. Aortic Valve: The aortic valve is tricuspid. Aortic valve regurgitation is not visualized. No aortic stenosis is present. Pulmonic Valve: The pulmonic valve was normal in structure. Pulmonic valve regurgitation is not visualized. No evidence of pulmonic stenosis. Aorta: The aortic root is normal in size and structure. Venous: The inferior vena cava is normal in size with greater than 50%  respiratory variability, suggesting right atrial pressure of 3 mmHg. IAS/Shunts: The interatrial septum is aneurysmal. No atrial level shunt detected by color flow Doppler. Additional Comments: Apical hypokinesis with overall normal LV function.  LEFT VENTRICLE PLAX 2D LVIDd:         3.60 cm LVIDs:         2.80 cm LV PW:         1.50 cm LV IVS:        1.10 cm LVOT diam:     1.80 cm LV SV:         63 LV SV Index:   42 LVOT Area:     2.54 cm  LV Volumes (MOD) LV vol d, MOD A2C: 112.0 ml LV vol d, MOD A4C: 95.9 ml LV vol s, MOD A2C: 49.9 ml LV vol s, MOD A4C: 48.0 ml LV SV MOD A2C:     62.1 ml LV SV MOD A4C:     95.9 ml LV SV MOD BP:      53.4 ml RIGHT VENTRICLE TAPSE (M-mode): 1.7 cm LEFT ATRIUM             Index       RIGHT ATRIUM           Index LA diam:        3.20 cm 2.15 cm/m  RA Area:     18.10 cm LA Vol (A2C):   82.0 ml 55.10 ml/m RA Volume:   46.30 ml  31.11 ml/m LA Vol (A4C):   75.0 ml 50.39 ml/m LA Biplane Vol: 82.4 ml 55.37 ml/m  AORTIC VALVE LVOT Vmax:   149.50 cm/s LVOT Vmean:  100.150 cm/s LVOT VTI:    0.246 m  AORTA Ao Root diam: 2.60 cm Ao Asc diam:  2.80 cm MR PISA:        0.57 cm TRICUSPID VALVE MR PISA Radius: 0.30 cm  TR Peak grad:   42.0 mmHg                          TR Vmax:        324.00 cm/s  SHUNTS                          Systemic VTI:  0.25 m                          Systemic Diam: 1.80 cm Kirk Ruths MD Electronically signed by Kirk Ruths MD Signature Date/Time: 09/22/2020/11:02:07 AM    Final     (Echo, Carotid, EGD, Colonoscopy, ERCP)    Subjective: Patient seen and examined.  Mild pain on the left arm.  She had bleeding on the permacath that is stopped with pressure.  Eager to go home.   Discharge Exam: Vitals:   09/27/20 0341 09/27/20 0737  BP: (!) 177/97   Pulse: 69   Resp: 19   Temp: 98.8 F (37.1 C) 98.1 F (36.7 C)  SpO2: 97%    Vitals:   09/26/20 1954 09/26/20 2315 09/27/20 0341 09/27/20 0737  BP: (!) 161/86 (!) 176/91  (!) 177/97   Pulse: 76 77 69   Resp: 16 17 19    Temp: 98.7 F (37.1 C) 98.7 F (37.1 C) 98.8 F (37.1 C) 98.1 F (36.7 C)  TempSrc: Oral Oral Oral Oral  SpO2: 99% 99% 97%   Weight:      Height:        General: Pt is alert, awake, not in acute distress On room air.  Frail and debilitated.  Not in any distress. Cardiovascular: RRR, S1/S2 +, no rubs, no gallops, patient has a flow murmur. Respiratory: CTA bilaterally, no wheezing, no rhonchi, no added sound. Abdominal: Soft, NT, ND, bowel sounds + Extremities:  Permacath right chest. Left arm with mild swelling and edema.  Mild tenderness present.  Distal neurovascular status intact.    The results of significant diagnostics from this hospitalization (including imaging, microbiology, ancillary and laboratory) are listed below for reference.     Microbiology: Recent Results (from the past 240 hour(s))  Blood culture (routine single)     Status: None   Collection Time: 09/21/20  8:19 PM   Specimen: BLOOD RIGHT ARM  Result Value Ref Range Status   Specimen Description BLOOD RIGHT ARM  Final   Special Requests   Final    BOTTLES DRAWN AEROBIC AND ANAEROBIC Blood Culture results may not be optimal due to an excessive volume of blood received in culture bottles   Culture   Final    NO GROWTH 5 DAYS Performed at [facility], [place] [address] [place], [place] [address]    Report Status 09/26/2020 FINAL  Final  Urine culture     Status: None   Collection Time: 09/21/20  8:19 PM   Specimen: In/Out Cath Urine  Result Value Ref Range Status   Specimen Description IN/OUT CATH URINE  Final   Special Requests NONE  Final   Culture   Final    NO GROWTH Performed at [facility], [place] [address] [place], [place] [address]    Report Status 09/23/2020 FINAL  Final  Resp Panel by RT-PCR (Flu A&B, Covid) Nasopharyngeal Swab     Status: None   Collection Time: 09/21/20  8:54 PM   Specimen: Nasopharyngeal Swab;  Nasopharyngeal(NP) swabs in vial transport medium  Result Value Ref Range Status   SARS Coronavirus 2 by RT PCR NEGATIVE NEGATIVE Final    Comment: (NOTE) SARS-CoV-2 target nucleic acids are NOT DETECTED.  The SARS-CoV-2 RNA is generally detectable  in upper respiratory specimens during the acute phase of infection. The lowest concentration of SARS-CoV-2 viral copies this assay can detect is 138 copies/mL. A negative result does not preclude SARS-Cov-2 infection and should not be used as the sole basis for treatment or other patient management decisions. A negative result may occur with  improper specimen collection/handling, submission of specimen other than nasopharyngeal swab, presence of viral mutation(s) within the areas targeted by this assay, and inadequate number of viral copies(<138 copies/mL). A negative result must be combined with clinical observations, patient history, and epidemiological information. The expected result is Negative.  Fact Sheet for Patients:  EntrepreneurPulse.com.au  Fact Sheet for Healthcare Providers:  IncredibleEmployment.be  This test is no t yet approved or cleared by the [place] FDA and  has been authorized for detection and/or diagnosis of SARS-CoV-2 by FDA under an Emergency Use Authorization (EUA). This EUA will remain  in effect (meaning this test can be used) for the duration of the COVID-19 declaration under Section 564(b)(1) of the Act, 21 U.S.C.section 360bbb-3(b)(1), unless the authorization is terminated  or revoked sooner.       Influenza A by PCR NEGATIVE NEGATIVE Final   Influenza B by PCR NEGATIVE NEGATIVE Final    Comment: (NOTE) The Xpert Xpress SARS-CoV-2/FLU/RSV plus assay is intended as an aid in the diagnosis of influenza from Nasopharyngeal swab specimens and should not be used as a sole basis for treatment. Nasal washings and aspirates are unacceptable for Xpert Xpress  SARS-CoV-2/FLU/RSV testing.  Fact Sheet for Patients: EntrepreneurPulse.com.au  Fact Sheet for Healthcare Providers: IncredibleEmployment.be  This test is not yet approved or cleared by the [place] FDA and has been authorized for detection and/or diagnosis of SARS-CoV-2 by FDA under an Emergency Use Authorization (EUA). This EUA will remain in effect (meaning this test can be used) for the duration of the COVID-19 declaration under Section 564(b)(1) of the Act, 21 U.S.C. section 360bbb-3(b)(1), unless the authorization is terminated or revoked.  Performed at [facility], [place] [address] [place], [place] [address]   MRSA PCR Screening     Status: None   Collection Time: 09/22/20  2:02 AM   Specimen: Nasal Mucosa; Nasopharyngeal  Result Value Ref Range Status   MRSA by PCR NEGATIVE NEGATIVE Final    Comment:        The GeneXpert MRSA Assay (FDA approved for NASAL specimens only), is one component of a comprehensive MRSA colonization surveillance program. It is not intended to diagnose MRSA infection nor to guide or monitor treatment for MRSA infections. Performed at [facility], [place] [address] [place], [place] [address]   Expectorated Sputum Assessment w Gram Stain, Rflx to Resp Cult     Status: None   Collection Time: 09/25/20  9:01 AM   Specimen: Expectorated Sputum  Result Value Ref Range Status   Specimen Description EXPECTORATED SPUTUM  Final   Special Requests NONE  Final   Sputum evaluation   Final    Sputum specimen not acceptable for testing.  Please recollect.   Gram Stain Report Called to,Read Back By and Verified With: Verdene Rio RN, AT 1040 09/25/20 Rush Landmark Performed at [facility], [place] [address] [place], [place] [address]    Report Status 09/25/2020 FINAL  Final     Labs: BNP (last 3 results) Recent Labs    09/21/20 2300 09/23/20 0400  BNP 3,659.7* 6,546.5*   Basic Metabolic  Panel: Recent Labs  Lab 09/22/20  0737 09/22/20 1303 09/23/20 0400 09/24/20 0056 09/25/20 0140  NA 142 139 141 135 138  K 3.6 3.5 3.4* 3.5 3.8  CL 107 105 104 99 100  CO2 16* 18* 24 26 25   GLUCOSE 111* 121* 139* 132* 94  BUN 113* 113* 65* 30* 38*  CREATININE 13.93* 13.98* 9.54* 5.73* 7.26*  CALCIUM 7.4* 7.2* 8.2* 8.3* 8.5*  PHOS  --  8.2*  --   --  6.0*   Liver Function Tests: Recent Labs  Lab 09/21/20 2021 09/22/20 1303 09/25/20 0140  AST 32  --   --   ALT 27  --   --   ALKPHOS 114  --   --   BILITOT 0.8  --   --   PROT 6.8  --   --   ALBUMIN 3.0* 2.5* 2.4*   No results for input(s): LIPASE, AMYLASE in the last 168 hours. No results for input(s): AMMONIA in the last 168 hours. CBC: Recent Labs  Lab 09/21/20 2021 09/22/20 0447 09/23/20 0400 09/24/20 0056 09/25/20 0140 09/26/20 0006 09/27/20 0125  WBC 8.4   < > 7.6 8.3 7.8 9.3 9.4  NEUTROABS 7.0  --   --   --   --   --   --   HGB 7.3*   < > 8.6* 8.8* 7.8* 8.1* 7.7*  HCT 23.4*   < > 26.0* 26.4* 24.9* 25.2* 24.2*  MCV 91.4   < > 85.2 85.7 88.9 88.7 89.3  PLT 411*   < > 275 267 300 300 368   < > = values in this interval not displayed.   Cardiac Enzymes: No results for input(s): CKTOTAL, CKMB, CKMBINDEX, TROPONINI in the last 168 hours. BNP: Invalid input(s): POCBNP CBG: Recent Labs  Lab 09/24/20 1026 09/24/20 2116 09/25/20 0615 09/26/20 1114 09/26/20 1638  GLUCAP 108* 153* 105* 109* 119*   D-Dimer No results for input(s): DDIMER in the last 72 hours. Hgb A1c No results for input(s): HGBA1C in the last 72 hours. Lipid Profile No results for input(s): CHOL, HDL, LDLCALC, TRIG, CHOLHDL, LDLDIRECT in the last 72 hours. Thyroid function studies No results for input(s): TSH, T4TOTAL, T3FREE, THYROIDAB in the last 72 hours.  Invalid input(s): FREET3 Anemia work up No results for input(s): VITAMINB12, FOLATE, FERRITIN, TIBC, IRON, RETICCTPCT in the last 72 hours. Urinalysis    Component Value  Date/Time   COLORURINE STRAW (A) 09/21/2020 2018   APPEARANCEUR HAZY (A) 09/21/2020 2018   LABSPEC 1.010 09/21/2020 2018   PHURINE 7.0 09/21/2020 2018   GLUCOSEU NEGATIVE 09/21/2020 2018   GLUCOSEU NEG mg/dL 10/30/2009 1944   HGBUR NEGATIVE 09/21/2020 2018   BILIRUBINUR NEGATIVE 09/21/2020 2018   BILIRUBINUR negative 05/04/2018 0910   KETONESUR NEGATIVE 09/21/2020 2018   PROTEINUR 100 (A) 09/21/2020 2018   UROBILINOGEN 0.2 05/04/2018 0910   UROBILINOGEN 1 02/14/2014 0946   NITRITE NEGATIVE 09/21/2020 2018   LEUKOCYTESUR LARGE (A) 09/21/2020 2018   Sepsis Labs Invalid input(s): PROCALCITONIN,  WBC,  LACTICIDVEN Microbiology Recent Results (from the past 240 hour(s))  Blood culture (routine single)     Status: None   Collection Time: 09/21/20  8:19 PM   Specimen: BLOOD RIGHT ARM  Result Value Ref Range Status   Specimen Description BLOOD RIGHT ARM  Final   Special Requests   Final    BOTTLES DRAWN AEROBIC AND ANAEROBIC Blood Culture results may not be optimal due to an excessive volume of blood received in culture bottles   Culture  Final    NO GROWTH 5 DAYS Performed at [facility], [place] [address], [place], [place] [address]    Report Status 09/26/2020 FINAL  Final  Urine culture     Status: None   Collection Time: 09/21/20  8:19 PM   Specimen: In/Out Cath Urine  Result Value Ref Range Status   Specimen Description IN/OUT CATH URINE  Final   Special Requests NONE  Final   Culture   Final    NO GROWTH Performed at [facility], [place] [address], [place], [place] [address]    Report Status 09/23/2020 FINAL  Final  Resp Panel by RT-PCR (Flu A&B, Covid) Nasopharyngeal Swab     Status: None   Collection Time: 09/21/20  8:54 PM   Specimen: Nasopharyngeal Swab; Nasopharyngeal(NP) swabs in vial transport medium  Result Value Ref Range Status   SARS Coronavirus 2 by RT PCR NEGATIVE NEGATIVE Final    Comment: (NOTE) SARS-CoV-2 target nucleic acids are NOT  DETECTED.  The SARS-CoV-2 RNA is generally detectable in upper respiratory specimens during the acute phase of infection. The lowest concentration of SARS-CoV-2 viral copies this assay can detect is 138 copies/mL. A negative result does not preclude SARS-Cov-2 infection and should not be used as the sole basis for treatment or other patient management decisions. A negative result may occur with  improper specimen collection/handling, submission of specimen other than nasopharyngeal swab, presence of viral mutation(s) within the areas targeted by this assay, and inadequate number of viral copies(<138 copies/mL). A negative result must be combined with clinical observations, patient history, and epidemiological information. The expected result is Negative.  Fact Sheet for Patients:  EntrepreneurPulse.com.au  Fact Sheet for Healthcare Providers:  IncredibleEmployment.be  This test is no t yet approved or cleared by the [place] FDA and  has been authorized for detection and/or diagnosis of SARS-CoV-2 by FDA under an Emergency Use Authorization (EUA). This EUA will remain  in effect (meaning this test can be used) for the duration of the COVID-19 declaration under Section 564(b)(1) of the Act, 21 U.S.C.section 360bbb-3(b)(1), unless the authorization is terminated  or revoked sooner.       Influenza A by PCR NEGATIVE NEGATIVE Final   Influenza B by PCR NEGATIVE NEGATIVE Final    Comment: (NOTE) The Xpert Xpress SARS-CoV-2/FLU/RSV plus assay is intended as an aid in the diagnosis of influenza from Nasopharyngeal swab specimens and should not be used as a sole basis for treatment. Nasal washings and aspirates are unacceptable for Xpert Xpress SARS-CoV-2/FLU/RSV testing.  Fact Sheet for Patients: EntrepreneurPulse.com.au  Fact Sheet for Healthcare Providers: IncredibleEmployment.be  This test is not yet  approved or cleared by the [place] FDA and has been authorized for detection and/or diagnosis of SARS-CoV-2 by FDA under an Emergency Use Authorization (EUA). This EUA will remain in effect (meaning this test can be used) for the duration of the COVID-19 declaration under Section 564(b)(1) of the Act, 21 U.S.C. section 360bbb-3(b)(1), unless the authorization is terminated or revoked.  Performed at [facility], [place] [address] [place], [place] [address]   MRSA PCR Screening     Status: None   Collection Time: 09/22/20  2:02 AM   Specimen: Nasal Mucosa; Nasopharyngeal  Result Value Ref Range Status   MRSA by PCR NEGATIVE NEGATIVE Final    Comment:        The GeneXpert MRSA Assay (FDA approved for NASAL specimens only), is one component of a comprehensive  MRSA colonization surveillance program. It is not intended to diagnose MRSA infection nor to guide or monitor treatment for MRSA infections. Performed at [facility], [place] [address], [place], [place] [address]   Expectorated Sputum Assessment w Gram Stain, Rflx to Resp Cult     Status: None   Collection Time: 09/25/20  9:01 AM   Specimen: Expectorated Sputum  Result Value Ref Range Status   Specimen Description EXPECTORATED SPUTUM  Final   Special Requests NONE  Final   Sputum evaluation   Final    Sputum specimen not acceptable for testing.  Please recollect.   Gram Stain Report Called to,Read Back By and Verified With: Verdene Rio RN, AT 1040 09/25/20 Rush Landmark Performed at [facility], [place] [address], [place], [place] [address]    Report Status 09/25/2020 FINAL  Final     Time coordinating discharge: 40 minutes  SIGNED:   Barb [place], MD  [facility] 09/27/2020, 9:30 AM

## 2020-09-28 ENCOUNTER — Telehealth: Payer: Self-pay | Admitting: Physician Assistant

## 2020-09-28 DIAGNOSIS — N186 End stage renal disease: Secondary | ICD-10-CM | POA: Diagnosis not present

## 2020-09-28 DIAGNOSIS — D689 Coagulation defect, unspecified: Secondary | ICD-10-CM | POA: Diagnosis not present

## 2020-09-28 DIAGNOSIS — Z992 Dependence on renal dialysis: Secondary | ICD-10-CM | POA: Diagnosis not present

## 2020-09-28 DIAGNOSIS — N2581 Secondary hyperparathyroidism of renal origin: Secondary | ICD-10-CM | POA: Diagnosis not present

## 2020-09-28 DIAGNOSIS — D509 Iron deficiency anemia, unspecified: Secondary | ICD-10-CM | POA: Diagnosis not present

## 2020-09-28 DIAGNOSIS — D631 Anemia in chronic kidney disease: Secondary | ICD-10-CM | POA: Diagnosis not present

## 2020-09-28 NOTE — Telephone Encounter (Signed)
Transition of care contact from inpatient facility  Date of discharge: 09/27/20 Date of contact: 09/29/31 Method: Phone Spoke to: Monique Henderson  Monique Henderson contacted to discuss transition of care from recent inpatient hospitalization. Monique Henderson was admitted to [facility] from 09/21/20-09/27/20 with discharge diagnosis of progression to ESRD and volume overload. Pt reports she was discharged late yesterday and could not get to the dialysis center to sign her paper work. I spoke with her dialysis center today and confirmed she can sign paperwork before treatment today and they are expecting her. Pt reports her son is on his way to bring her to dialysis.   Medication changes were reviewed. Noted she was prescribed baclofen at discharge, advised NOT to take this due to her ESRD. Asked to bring her pill bottles in to the dialysis center as well for the RN to review with her.   Home health nursing/PT has been ordered. No other needs identified at this time.  Monique Henderson will follow up with his/her outpatient HD unit on: 09/28/20.   Anice Paganini, PA-C 09/28/2020, 10:26 AM  [employer]

## 2020-09-30 ENCOUNTER — Telehealth: Payer: Self-pay

## 2020-09-30 NOTE — Telephone Encounter (Signed)
Transition Care Management Unsuccessful Follow-up Telephone Call  Date of discharge and from where:  09/27/2020-[facility]  Attempts:  1st Attempt  Reason for unsuccessful TCM follow-up call:  No answer/busy

## 2020-10-01 ENCOUNTER — Telehealth: Payer: Self-pay

## 2020-10-01 DIAGNOSIS — D689 Coagulation defect, unspecified: Secondary | ICD-10-CM | POA: Diagnosis not present

## 2020-10-01 DIAGNOSIS — D509 Iron deficiency anemia, unspecified: Secondary | ICD-10-CM | POA: Diagnosis not present

## 2020-10-01 DIAGNOSIS — N186 End stage renal disease: Secondary | ICD-10-CM | POA: Diagnosis not present

## 2020-10-01 DIAGNOSIS — N2581 Secondary hyperparathyroidism of renal origin: Secondary | ICD-10-CM | POA: Diagnosis not present

## 2020-10-01 DIAGNOSIS — Z992 Dependence on renal dialysis: Secondary | ICD-10-CM | POA: Diagnosis not present

## 2020-10-01 NOTE — Telephone Encounter (Signed)
Transition Care Management Follow-up Telephone Call  Date of discharge and from where: 09/27/20-[facility]  How have you been since you were released from the hospital? Doing pretty good.  Any questions or concerns? No  Items Reviewed:  Did the pt receive and understand the discharge instructions provided? Yes   Medications obtained and verified? Yes   Other? Yes   Any new allergies since your discharge? No   Dietary orders reviewed? Yes  Do you have support at home? Yes   Home Care and Equipment/Supplies: Were home health services ordered? yes If so, what is the name of the agency? Unknown-not noted in d/c summary & patient does not know.  Has the agency set up a time to come to the patient's home? no Were any new equipment or medical supplies ordered?  No What is the name of the medical supply agency? n/a Were you able to get the supplies/equipment? not applicable Do you have any questions related to the use of the equipment or supplies? n/a  Functional Questionnaire: (I = Independent and D = Dependent) ADLs: I  Bathing/Dressing- I  Meal Prep- I  Eating- I  Maintaining continence- I  Transferring/Ambulation- I  Managing Meds- I  Follow up appointments reviewed:   PCP Hospital f/u appt confirmed? Yes  Scheduled to see Monique Henderson on 10/07/20 @ 2:50.  [facility] f/u appt confirmed? Yes  Scheduled to see Vascular surgeon on 10/17/20 @ 2:25.  Are transportation arrangements needed? No   If their condition worsens, is the pt aware to call PCP or go to the Emergency Dept.? Yes  Was the patient provided with contact information for the PCP's office or ED? Yes  Was to pt encouraged to call back with questions or concerns? Yes

## 2020-10-02 ENCOUNTER — Other Ambulatory Visit: Payer: Self-pay

## 2020-10-02 DIAGNOSIS — N186 End stage renal disease: Secondary | ICD-10-CM

## 2020-10-02 DIAGNOSIS — Z992 Dependence on renal dialysis: Secondary | ICD-10-CM

## 2020-10-02 DIAGNOSIS — Z681 Body mass index (BMI) 19 or less, adult: Secondary | ICD-10-CM | POA: Diagnosis not present

## 2020-10-02 DIAGNOSIS — Z79899 Other long term (current) drug therapy: Secondary | ICD-10-CM | POA: Diagnosis not present

## 2020-10-02 DIAGNOSIS — G894 Chronic pain syndrome: Secondary | ICD-10-CM | POA: Diagnosis not present

## 2020-10-02 DIAGNOSIS — Z9181 History of falling: Secondary | ICD-10-CM | POA: Diagnosis not present

## 2020-10-02 DIAGNOSIS — Z09 Encounter for follow-up examination after completed treatment for conditions other than malignant neoplasm: Secondary | ICD-10-CM | POA: Diagnosis not present

## 2020-10-02 DIAGNOSIS — M5136 Other intervertebral disc degeneration, lumbar region: Secondary | ICD-10-CM | POA: Diagnosis not present

## 2020-10-03 DIAGNOSIS — D509 Iron deficiency anemia, unspecified: Secondary | ICD-10-CM | POA: Diagnosis not present

## 2020-10-03 DIAGNOSIS — D689 Coagulation defect, unspecified: Secondary | ICD-10-CM | POA: Diagnosis not present

## 2020-10-03 DIAGNOSIS — Z992 Dependence on renal dialysis: Secondary | ICD-10-CM | POA: Diagnosis not present

## 2020-10-03 DIAGNOSIS — N2581 Secondary hyperparathyroidism of renal origin: Secondary | ICD-10-CM | POA: Diagnosis not present

## 2020-10-03 DIAGNOSIS — N186 End stage renal disease: Secondary | ICD-10-CM | POA: Diagnosis not present

## 2020-10-05 DIAGNOSIS — N186 End stage renal disease: Secondary | ICD-10-CM | POA: Diagnosis not present

## 2020-10-05 DIAGNOSIS — D689 Coagulation defect, unspecified: Secondary | ICD-10-CM | POA: Diagnosis not present

## 2020-10-05 DIAGNOSIS — Z992 Dependence on renal dialysis: Secondary | ICD-10-CM | POA: Diagnosis not present

## 2020-10-05 DIAGNOSIS — D509 Iron deficiency anemia, unspecified: Secondary | ICD-10-CM | POA: Diagnosis not present

## 2020-10-05 DIAGNOSIS — N2581 Secondary hyperparathyroidism of renal origin: Secondary | ICD-10-CM | POA: Diagnosis not present

## 2020-10-07 ENCOUNTER — Inpatient Hospital Stay: Payer: Medicare Other | Admitting: Registered Nurse

## 2020-10-08 DIAGNOSIS — N186 End stage renal disease: Secondary | ICD-10-CM | POA: Diagnosis not present

## 2020-10-08 DIAGNOSIS — D689 Coagulation defect, unspecified: Secondary | ICD-10-CM | POA: Diagnosis not present

## 2020-10-08 DIAGNOSIS — D509 Iron deficiency anemia, unspecified: Secondary | ICD-10-CM | POA: Diagnosis not present

## 2020-10-08 DIAGNOSIS — D631 Anemia in chronic kidney disease: Secondary | ICD-10-CM | POA: Diagnosis not present

## 2020-10-08 DIAGNOSIS — Z992 Dependence on renal dialysis: Secondary | ICD-10-CM | POA: Diagnosis not present

## 2020-10-08 DIAGNOSIS — N2581 Secondary hyperparathyroidism of renal origin: Secondary | ICD-10-CM | POA: Diagnosis not present

## 2020-10-09 ENCOUNTER — Telehealth: Payer: Self-pay

## 2020-10-09 NOTE — Telephone Encounter (Signed)
States patient canceled PT eval that was scheduled for today 5/11//2022.  Needs a verbal ok for new start date of 10/14/2020 at patients request?

## 2020-10-10 DIAGNOSIS — D509 Iron deficiency anemia, unspecified: Secondary | ICD-10-CM | POA: Diagnosis not present

## 2020-10-10 DIAGNOSIS — N186 End stage renal disease: Secondary | ICD-10-CM | POA: Diagnosis not present

## 2020-10-10 DIAGNOSIS — N2581 Secondary hyperparathyroidism of renal origin: Secondary | ICD-10-CM | POA: Diagnosis not present

## 2020-10-10 DIAGNOSIS — D631 Anemia in chronic kidney disease: Secondary | ICD-10-CM | POA: Diagnosis not present

## 2020-10-10 DIAGNOSIS — Z992 Dependence on renal dialysis: Secondary | ICD-10-CM | POA: Diagnosis not present

## 2020-10-10 DIAGNOSIS — D689 Coagulation defect, unspecified: Secondary | ICD-10-CM | POA: Diagnosis not present

## 2020-10-10 NOTE — Telephone Encounter (Signed)
Yes - ok to change to new start date  Thank you  Denice Paradise

## 2020-10-10 NOTE — Telephone Encounter (Signed)
Left vm message with pcp approval for verbal order.

## 2020-10-11 ENCOUNTER — Other Ambulatory Visit: Payer: Self-pay | Admitting: *Deleted

## 2020-10-11 DIAGNOSIS — Z992 Dependence on renal dialysis: Secondary | ICD-10-CM

## 2020-10-12 DIAGNOSIS — N186 End stage renal disease: Secondary | ICD-10-CM | POA: Diagnosis not present

## 2020-10-12 DIAGNOSIS — D689 Coagulation defect, unspecified: Secondary | ICD-10-CM | POA: Diagnosis not present

## 2020-10-12 DIAGNOSIS — Z992 Dependence on renal dialysis: Secondary | ICD-10-CM | POA: Diagnosis not present

## 2020-10-12 DIAGNOSIS — N2581 Secondary hyperparathyroidism of renal origin: Secondary | ICD-10-CM | POA: Diagnosis not present

## 2020-10-12 DIAGNOSIS — D509 Iron deficiency anemia, unspecified: Secondary | ICD-10-CM | POA: Diagnosis not present

## 2020-10-12 DIAGNOSIS — D631 Anemia in chronic kidney disease: Secondary | ICD-10-CM | POA: Diagnosis not present

## 2020-10-14 ENCOUNTER — Telehealth: Payer: Self-pay | Admitting: Registered Nurse

## 2020-10-14 NOTE — Telephone Encounter (Signed)
Thank you  Have discussed with patient how much I think she would benefit from this. Unfortunate that she is noncompliant. Would be happy to discuss this with her at another appt.  Thanks,  Denice Paradise

## 2020-10-14 NOTE — Telephone Encounter (Signed)
[facility] called in stating that they haven't been able to complete the PT eval with the pt due to lack of compliance. Pt had an appt last week which she cancelled. She had an appt today and no one answered the phone or the door.  She just wanted to make Richard aware of this.  Please call [phone] for questions.

## 2020-10-15 DIAGNOSIS — D689 Coagulation defect, unspecified: Secondary | ICD-10-CM | POA: Diagnosis not present

## 2020-10-15 DIAGNOSIS — Z992 Dependence on renal dialysis: Secondary | ICD-10-CM | POA: Diagnosis not present

## 2020-10-15 DIAGNOSIS — D631 Anemia in chronic kidney disease: Secondary | ICD-10-CM | POA: Diagnosis not present

## 2020-10-15 DIAGNOSIS — N2581 Secondary hyperparathyroidism of renal origin: Secondary | ICD-10-CM | POA: Diagnosis not present

## 2020-10-15 DIAGNOSIS — N186 End stage renal disease: Secondary | ICD-10-CM | POA: Diagnosis not present

## 2020-10-15 DIAGNOSIS — D509 Iron deficiency anemia, unspecified: Secondary | ICD-10-CM | POA: Diagnosis not present

## 2020-10-16 DIAGNOSIS — G894 Chronic pain syndrome: Secondary | ICD-10-CM | POA: Diagnosis not present

## 2020-10-16 DIAGNOSIS — Z681 Body mass index (BMI) 19 or less, adult: Secondary | ICD-10-CM | POA: Diagnosis not present

## 2020-10-16 DIAGNOSIS — Z79899 Other long term (current) drug therapy: Secondary | ICD-10-CM | POA: Diagnosis not present

## 2020-10-16 DIAGNOSIS — M5136 Other intervertebral disc degeneration, lumbar region: Secondary | ICD-10-CM | POA: Diagnosis not present

## 2020-10-16 DIAGNOSIS — Z9181 History of falling: Secondary | ICD-10-CM | POA: Diagnosis not present

## 2020-10-17 ENCOUNTER — Ambulatory Visit (HOSPITAL_COMMUNITY): Payer: Medicare Other

## 2020-10-17 ENCOUNTER — Encounter (HOSPITAL_COMMUNITY): Payer: Medicare Other

## 2020-10-17 DIAGNOSIS — N186 End stage renal disease: Secondary | ICD-10-CM | POA: Diagnosis not present

## 2020-10-17 DIAGNOSIS — Z992 Dependence on renal dialysis: Secondary | ICD-10-CM | POA: Diagnosis not present

## 2020-10-17 DIAGNOSIS — D631 Anemia in chronic kidney disease: Secondary | ICD-10-CM | POA: Diagnosis not present

## 2020-10-17 DIAGNOSIS — D509 Iron deficiency anemia, unspecified: Secondary | ICD-10-CM | POA: Diagnosis not present

## 2020-10-17 DIAGNOSIS — N2581 Secondary hyperparathyroidism of renal origin: Secondary | ICD-10-CM | POA: Diagnosis not present

## 2020-10-17 DIAGNOSIS — D689 Coagulation defect, unspecified: Secondary | ICD-10-CM | POA: Diagnosis not present

## 2020-10-19 DIAGNOSIS — D631 Anemia in chronic kidney disease: Secondary | ICD-10-CM | POA: Diagnosis not present

## 2020-10-19 DIAGNOSIS — Z992 Dependence on renal dialysis: Secondary | ICD-10-CM | POA: Diagnosis not present

## 2020-10-19 DIAGNOSIS — N186 End stage renal disease: Secondary | ICD-10-CM | POA: Diagnosis not present

## 2020-10-19 DIAGNOSIS — D689 Coagulation defect, unspecified: Secondary | ICD-10-CM | POA: Diagnosis not present

## 2020-10-19 DIAGNOSIS — N2581 Secondary hyperparathyroidism of renal origin: Secondary | ICD-10-CM | POA: Diagnosis not present

## 2020-10-19 DIAGNOSIS — D509 Iron deficiency anemia, unspecified: Secondary | ICD-10-CM | POA: Diagnosis not present

## 2020-10-22 ENCOUNTER — Telehealth: Payer: Self-pay | Admitting: Registered Nurse

## 2020-10-22 DIAGNOSIS — N186 End stage renal disease: Secondary | ICD-10-CM | POA: Diagnosis not present

## 2020-10-22 DIAGNOSIS — N2581 Secondary hyperparathyroidism of renal origin: Secondary | ICD-10-CM | POA: Diagnosis not present

## 2020-10-22 DIAGNOSIS — D509 Iron deficiency anemia, unspecified: Secondary | ICD-10-CM | POA: Diagnosis not present

## 2020-10-22 DIAGNOSIS — D689 Coagulation defect, unspecified: Secondary | ICD-10-CM | POA: Diagnosis not present

## 2020-10-22 DIAGNOSIS — Z992 Dependence on renal dialysis: Secondary | ICD-10-CM | POA: Diagnosis not present

## 2020-10-22 DIAGNOSIS — D631 Anemia in chronic kidney disease: Secondary | ICD-10-CM | POA: Diagnosis not present

## 2020-10-22 NOTE — Telephone Encounter (Signed)
Pt called in asking for refills on the warfarin, rosuvastatin, cyclobenzaprine. Pt uses a [facility].   Please advise   She has an appt on Friday with Delfino Lovett

## 2020-10-24 DIAGNOSIS — N2581 Secondary hyperparathyroidism of renal origin: Secondary | ICD-10-CM | POA: Diagnosis not present

## 2020-10-24 DIAGNOSIS — D509 Iron deficiency anemia, unspecified: Secondary | ICD-10-CM | POA: Diagnosis not present

## 2020-10-24 DIAGNOSIS — D631 Anemia in chronic kidney disease: Secondary | ICD-10-CM | POA: Diagnosis not present

## 2020-10-24 DIAGNOSIS — N186 End stage renal disease: Secondary | ICD-10-CM | POA: Diagnosis not present

## 2020-10-24 DIAGNOSIS — D689 Coagulation defect, unspecified: Secondary | ICD-10-CM | POA: Diagnosis not present

## 2020-10-24 DIAGNOSIS — Z992 Dependence on renal dialysis: Secondary | ICD-10-CM | POA: Diagnosis not present

## 2020-10-25 ENCOUNTER — Ambulatory Visit: Payer: Medicare Other | Admitting: Registered Nurse

## 2020-10-29 ENCOUNTER — Ambulatory Visit: Payer: Medicare Other

## 2020-10-29 DIAGNOSIS — I129 Hypertensive chronic kidney disease with stage 1 through stage 4 chronic kidney disease, or unspecified chronic kidney disease: Secondary | ICD-10-CM | POA: Diagnosis not present

## 2020-10-29 DIAGNOSIS — Z992 Dependence on renal dialysis: Secondary | ICD-10-CM | POA: Diagnosis not present

## 2020-10-29 DIAGNOSIS — N186 End stage renal disease: Secondary | ICD-10-CM | POA: Diagnosis not present

## 2020-10-30 ENCOUNTER — Ambulatory Visit: Payer: Medicare Other | Admitting: Registered Nurse

## 2020-11-13 ENCOUNTER — Ambulatory Visit (HOSPITAL_COMMUNITY): Payer: Medicare Other | Attending: Vascular Surgery

## 2020-11-13 ENCOUNTER — Ambulatory Visit (HOSPITAL_COMMUNITY): Payer: Medicare Other

## 2020-11-27 ENCOUNTER — Emergency Department (HOSPITAL_COMMUNITY): Payer: Medicare Other

## 2020-11-27 ENCOUNTER — Other Ambulatory Visit: Payer: Self-pay

## 2020-11-27 ENCOUNTER — Emergency Department (HOSPITAL_COMMUNITY)
Admission: EM | Admit: 2020-11-27 | Discharge: 2020-11-28 | Disposition: A | Discharge status: Home / Self Care | Payer: Medicare Other | Attending: Emergency Medicine | Admitting: Emergency Medicine

## 2020-11-27 ENCOUNTER — Encounter (HOSPITAL_COMMUNITY): Payer: Self-pay | Admitting: Emergency Medicine

## 2020-11-27 DIAGNOSIS — Z79899 Other long term (current) drug therapy: Secondary | ICD-10-CM | POA: Diagnosis not present

## 2020-11-27 DIAGNOSIS — Z20822 Contact with and (suspected) exposure to covid-19: Secondary | ICD-10-CM | POA: Diagnosis not present

## 2020-11-27 DIAGNOSIS — Z853 Personal history of malignant neoplasm of breast: Secondary | ICD-10-CM | POA: Insufficient documentation

## 2020-11-27 DIAGNOSIS — Z992 Dependence on renal dialysis: Secondary | ICD-10-CM | POA: Insufficient documentation

## 2020-11-27 DIAGNOSIS — Z87891 Personal history of nicotine dependence: Secondary | ICD-10-CM | POA: Diagnosis not present

## 2020-11-27 DIAGNOSIS — I120 Hypertensive chronic kidney disease with stage 5 chronic kidney disease or end stage renal disease: Secondary | ICD-10-CM | POA: Insufficient documentation

## 2020-11-27 DIAGNOSIS — N186 End stage renal disease: Secondary | ICD-10-CM | POA: Insufficient documentation

## 2020-11-27 DIAGNOSIS — R0602 Shortness of breath: Secondary | ICD-10-CM | POA: Diagnosis present

## 2020-11-27 DIAGNOSIS — Z7901 Long term (current) use of anticoagulants: Secondary | ICD-10-CM | POA: Insufficient documentation

## 2020-11-27 NOTE — ED Triage Notes (Signed)
HD pt arriving by [facility] from home for SOB that started today pt missed 5 treatments.  SR on EKG with one small episodes of SVT changed to SR after coughing, HR 90, 96% RA, BP 200/100.

## 2020-11-28 ENCOUNTER — Emergency Department (HOSPITAL_COMMUNITY): Payer: Medicare Other

## 2020-11-28 DIAGNOSIS — R0602 Shortness of breath: Secondary | ICD-10-CM | POA: Diagnosis not present

## 2020-11-28 LAB — CBC WITH DIFFERENTIAL/PLATELET
Abs Immature Granulocytes: 0.02 10*3/uL (ref 0.00–0.07)
Basophils Absolute: 0.1 10*3/uL (ref 0.0–0.1)
Basophils Relative: 1 %
Eosinophils Absolute: 0.2 10*3/uL (ref 0.0–0.5)
Eosinophils Relative: 4 %
HCT: 27.3 % — ABNORMAL LOW (ref 36.0–46.0)
Hemoglobin: 8.5 g/dL — ABNORMAL LOW (ref 12.0–15.0)
Immature Granulocytes: 0 %
Lymphocytes Relative: 26 %
Lymphs Abs: 1.4 10*3/uL (ref 0.7–4.0)
MCH: 29.9 pg (ref 26.0–34.0)
MCHC: 31.1 g/dL (ref 30.0–36.0)
MCV: 96.1 fL (ref 80.0–100.0)
Monocytes Absolute: 0.5 10*3/uL (ref 0.1–1.0)
Monocytes Relative: 9 %
Neutro Abs: 3.2 10*3/uL (ref 1.7–7.7)
Neutrophils Relative %: 60 %
Platelets: 260 10*3/uL (ref 150–400)
RBC: 2.84 MIL/uL — ABNORMAL LOW (ref 3.87–5.11)
RDW: 17.2 % — ABNORMAL HIGH (ref 11.5–15.5)
WBC: 5.4 10*3/uL (ref 4.0–10.5)
nRBC: 0 % (ref 0.0–0.2)

## 2020-11-28 LAB — COMPREHENSIVE METABOLIC PANEL
ALT: 20 U/L (ref 0–44)
AST: 23 U/L (ref 15–41)
Albumin: 3.6 g/dL (ref 3.5–5.0)
Alkaline Phosphatase: 77 U/L (ref 38–126)
Anion gap: 14 (ref 5–15)
BUN: 106 mg/dL — ABNORMAL HIGH (ref 8–23)
CO2: 16 mmol/L — ABNORMAL LOW (ref 22–32)
Calcium: 8.6 mg/dL — ABNORMAL LOW (ref 8.9–10.3)
Chloride: 106 mmol/L (ref 98–111)
Creatinine, Ser: 14.83 mg/dL — ABNORMAL HIGH (ref 0.44–1.00)
GFR, Estimated: 2 mL/min — ABNORMAL LOW (ref >60)
Glucose, Bld: 90 mg/dL (ref 70–99)
Potassium: 4.7 mmol/L (ref 3.5–5.1)
Sodium: 136 mmol/L (ref 135–145)
Total Bilirubin: 0.6 mg/dL (ref 0.3–1.2)
Total Protein: 7 g/dL (ref 6.5–8.1)

## 2020-11-28 LAB — I-STAT CHEM 8, ED
BUN: 99 mg/dL — ABNORMAL HIGH (ref 8–23)
Calcium, Ion: 0.95 mmol/L — ABNORMAL LOW (ref 1.15–1.40)
Chloride: 114 mmol/L — ABNORMAL HIGH (ref 98–111)
Creatinine, Ser: 12.9 mg/dL — ABNORMAL HIGH (ref 0.44–1.00)
Glucose, Bld: 71 mg/dL (ref 70–99)
HCT: 22 % — ABNORMAL LOW (ref 36.0–46.0)
Hemoglobin: 7.5 g/dL — ABNORMAL LOW (ref 12.0–15.0)
Potassium: 4.3 mmol/L (ref 3.5–5.1)
Sodium: 140 mmol/L (ref 135–145)
TCO2: 14 mmol/L — ABNORMAL LOW (ref 22–32)

## 2020-11-28 LAB — TROPONIN I (HIGH SENSITIVITY)
Troponin I (High Sensitivity): 180 ng/L (ref <18)
Troponin I (High Sensitivity): 184 ng/L (ref <18)

## 2020-11-28 LAB — RESP PANEL BY RT-PCR (FLU A&B, COVID) ARPGX2
Influenza A by PCR: NEGATIVE
Influenza B by PCR: NEGATIVE
SARS Coronavirus 2 by RT PCR: NEGATIVE

## 2020-11-28 MED ORDER — ALBUTEROL SULFATE HFA 108 (90 BASE) MCG/ACT IN AERS
2.0000 | INHALATION_SPRAY | Freq: Once | RESPIRATORY_TRACT | Status: AC
  Administered 2020-11-28: 2 via RESPIRATORY_TRACT
  Filled 2020-11-28: qty 6.7

## 2020-11-28 NOTE — Discharge Instructions (Addendum)
Use the inhaler every 4 hours if needed for shortness of breath. You do not need urgent dialysis tonight but it is important that you go to dialysis regularly and take your medications as prescribed to control blood pressure.

## 2020-11-28 NOTE — ED Provider Notes (Addendum)
[facility] EMERGENCY DEPARTMENT Provider Note   CSN: [id number] Arrival date & time: 11/27/20  2346     History Chief Complaint  Patient presents with   Shortness of Breath    Monique Henderson is a 70 y.o. female.  Patient to ED with SOB x 1 day. No chest pain, fever or cough. No nausea or vomiting. She has a history of ESRD-HD, HTN, a-fib on Coumadin, noncompliance. She states she last went to dialysis over one week ago. She states she has been taking her medications, "except I didn't take them this morning". She has used albuterol inhalers in the past but reports she does not have one now.   The history is provided by the patient. No language interpreter was used.  Shortness of Breath Associated symptoms: no abdominal pain, no chest pain, no cough, no fever and no vomiting       Past Medical History:  Diagnosis Date   AF (paroxysmal atrial fibrillation) ([place]) 05/29/2019   Aortic atherosclerosis ([place]) 07/05/2019   Aortic dissection ([place]) 04/04/2019   Atelectasis 2002   Bilateral   Atrial fibrillation with RVR ([place]) 05/18/2019   Atypical chest pain 07/05/2019   Bone spur 2008   Right calcaneal foot spur   Breast cancer ([place]) 2004   Ductal carcinoma in situ of the left breast; S/P left partial mastectomy 02/26/2003; S/P re-excision of cranial and lateral margins11/18/2004.radiation   Breast cancer ([place]) 09/21/2012   right breast/ last radiation treatment 03/22/2013   Cerebral thrombosis with cerebral infarction 05/22/2019   Chronic kidney disease, stage IV (severe) ([place]) 10/10/2007   Chronic low back pain 06/22/2016   Chronic obstructive lung disease ([place]) 01/16/2017   CKD (chronic kidney disease), stage IV ([place]) 07/19/2017   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 Autumn Messing; S/P re-excision of superior and inferior margins 10/27/2012.    Dissection of aorta (HCC) 04/03/2019   Elevated troponin 08/28/2019    ESRD on dialysis ([place]) 05/29/2019   Essential hypertension 09/16/2006   GERD 09/16/2006   GERD (gastroesophageal reflux disease)    H/O: stroke 05/29/2019   Hepatitis C    treated and RNA confirmed not detectable 01/2017   Hot flashes    Hx of radiation therapy 2005   left breast   Hx of radiation therapy 01/11/13- 03/22/13   right breast 5760 cGy 30 sessions   Hyperkalemia 08/28/2019   Hypertension    Insomnia 03/14/2015   Low back pain    Lumbar spinal stenosis    S/P lumbar decompressive laminectomy, fusion and plating for lumbar spinal stensosis   Malnutrition of moderate degree 05/19/2019   Non compliance with medical treatment 12/04/2017   Normocytic anemia    With thrombocytosis   On Coumadin for atrial fibrillation ([place]) 10/03/2019   Osteoarthritis    Paroxysmal SVT (supraventricular tachycardia) ([place]) 08/27/2019   Personal history of radiation therapy    Pleural effusion 05/18/2019   Positive D dimer 08/28/2019   Right ureteral stone 2002   S/P aortic aneurysm repair 04/07/2019   S/P lumbar spinal fusion 01/18/2014   S/P lumbar decompressive laminectomy, fusion, and plating for lumbar spinal stenosis on 05/27/2009 by Dr. Eustace Moore.  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 Eustace Moore.  Shortness of breath    from pain   SIRS (systemic inflammatory response syndrome) (HCC) 08/27/2019   SVT (supraventricular tachycardia) ([place]) 08/28/2019   Tobacco use disorder 04/19/2009   Uterine fibroid    Wears dentures    top    Patient Active Problem List   Diagnosis Date Noted   Acute respiratory failure with hypoxia ([place]) 09/21/2020   Acute renal failure superimposed on chronic kidney disease ([place]) 09/21/2020   Community acquired pneumonia 09/21/2020   Uremia 84/66/5993    Metabolic acidosis 57/06/7791   Hypertensive urgency 09/21/2020   On Coumadin for atrial fibrillation ([place]) 10/03/2019   Troponin level elevated 08/28/2019   Positive D dimer 08/28/2019   Hyperkalemia 08/28/2019   SVT (supraventricular tachycardia) ([place]) 08/28/2019   SIRS (systemic inflammatory response syndrome) ([place]) 08/27/2019   Paroxysmal SVT (supraventricular tachycardia) ([place]) 08/27/2019   Aortic atherosclerosis ([place]) 07/05/2019   Chest pain 07/05/2019   History of CVA (cerebrovascular accident) 05/29/2019   AF (paroxysmal atrial fibrillation) ([place]) 05/29/2019   ESRD on dialysis ([place]) 05/29/2019   Cerebral thrombosis with cerebral infarction 05/22/2019   Malnutrition of moderate degree 05/19/2019   Pleural effusion 05/18/2019   Atrial fibrillation with RVR ([place]) 05/18/2019   S/P aortic aneurysm repair 04/07/2019   Aortic dissection ([place]) 04/04/2019   Dissection of aorta ([place]) 04/03/2019   Non compliance with medical treatment 12/04/2017   CKD (chronic kidney disease), stage IV ([place]) 07/19/2017   Chronic obstructive lung disease ([place]) 01/16/2017   Chronic low back pain 06/22/2016   Insomnia 03/14/2015   S/P lumbar spinal fusion 01/18/2014   Tobacco use disorder 04/19/2009   Essential hypertension 09/16/2006   GERD 09/16/2006    Past Surgical History:  Procedure Laterality Date   ANTERIOR LAT LUMBAR FUSION N/A 01/18/2014   Procedure: ANTERIOR LATERAL LUMBAR FUSION LUMBAR TWO-THREE;  Surgeon: Eustace Moore, MD;  Location: [facility];  Service: Neurosurgery;  Laterality: N/A;   ANTERIOR LUMBAR FUSION  01/18/2014   AV FISTULA PLACEMENT Left 04/20/2019   Procedure: ARTERIOVENOUS (AV) FISTULA CREATION;  Surgeon: [name], MD;  Location: [place];  Service: Vascular;  Laterality: Left;   BACK SURGERY     BREAST LUMPECTOMY Left 01/2003   BREAST LUMPECTOMY Right 2014   BREAST LUMPECTOMY WITH NEEDLE LOCALIZATION AND AXILLARY SENTINEL LYMPH NODE BX Right 10/13/2012    Procedure: BREAST LUMPECTOMY WITH NEEDLE LOCALIZATION;  Surgeon: [name], MD;  Location: [facility];  Service: General;  Laterality: Right;  Right breast wire localized lumpectomy   INSERTION OF DIALYSIS CATHETER Right 04/20/2019   Procedure: INSERTION OF DIALYSIS CATHETER, right internal jugular;  Surgeon: [name], MD;  Location: [facility];  Service: Vascular;  Laterality: Right;   INSERTION OF DIALYSIS CATHETER Right 09/24/2020   Procedure: INSERTION OF TUNNELED DIALYSIS CATHETER;  Surgeon: Rosetta Posner, MD;  Location: [facility];  Service: Vascular;  Laterality: Right;   IR FLUORO GUIDE CV LINE RIGHT  09/22/2020   IR THORACENTESIS ASP PLEURAL SPACE W/IMG GUIDE  05/19/2019   IR US GUIDE VASC ACCESS LEFT  09/22/2020   IR US GUIDE VASC ACCESS RIGHT  09/22/2020   IR VENOCAVAGRAM SVC  09/22/2020   LAMINECTOMY  05/27/2009   Lumbar decompressive laminectomy, fusion and plating for lumbar spinal stensosis   LIGATION OF ARTERIOVENOUS  FISTULA Left 09/24/2020   Procedure: LIGATION OF LEFT ARM ARTERIOVENOUS  FISTULA;  Surgeon: Rosetta Posner, MD;  Location: [facility];  Service: Vascular;  Laterality:  Left;   LUMBAR LAMINECTOMY/DECOMPRESSION MICRODISCECTOMY Left 03/23/2013   Procedure: LUMBAR LAMINECTOMY/DECOMPRESSION MICRODISCECTOMY 1 LEVEL;  Surgeon: Eustace Moore, MD;  Location: [facility];  Service: Neurosurgery;  Laterality: Left;  LUMBAR LAMINECTOMY/DECOMPRESSION MICRODISCECTOMY 1 LEVEL   MASTECTOMY, PARTIAL Left 02/26/2003   ; S/P re-excision of cranial and lateral margins 04/19/2003.    RE-EXCISION OF BREAST CANCER,SUPERIOR MARGINS Right 10/27/2012   Procedure: RE-EXCISION OF BREAST CANCER,SUPERIOR and inferior MARGINS;  Surgeon: [name], MD;  Location: [facility];  Service: General;  Laterality: Right;   RE-EXCISION OF BREAST LUMPECTOMY Left 04/2003   TEE WITHOUT CARDIOVERSION N/A 04/04/2019   Procedure: Transesophageal Echocardiogram ([place]);  Surgeon: Wonda Olds,  MD;  Location: [facility];  Service: Open Heart Surgery;  Laterality: N/A;   THORACIC AORTIC ANEURYSM REPAIR N/A 04/04/2019   Procedure: THORACIC ASCENDING ANEURYSM REPAIR (AAA)  USING 28 MM X 30 CM HEMASHIELD PLATINUM VASCULAR GRAFT;  Surgeon: Wonda Olds, MD;  [facility];  Service: Open Heart Surgery;  Laterality: N/A;     OB History     Gravida  2   Para  2   Term  1   Preterm  1   AB      Living         SAB      IAB      Ectopic      Multiple      Live Births              Family History  Problem Relation Age of Onset   Colon cancer Mother 27   Hypertension Mother    Diabetes Sister 72   Hypertension Sister    Diabetes Brother    Hypertension Brother    Diabetes Brother    Hypertension Brother    Kidney disease Son        s/p renal transplant   Hypertension Son    Diabetes Son    Multiple sclerosis Son    Bone cancer Sister 38   Breast cancer Neg Hx    Cervical cancer Neg Hx     Social History   Tobacco Use   Smoking status: Former    Packs/day: 0.25    Years: 44.00    Pack years: 11.00    Types: Cigarettes    Quit date: 05/24/2019    Years since quitting: 1.5   Smokeless tobacco: Never   Tobacco comments:    smoking less  Vaping Use   Vaping Use: Never used  Substance Use Topics   Alcohol use: Not Currently    Alcohol/week: 2.0 standard drinks    Types: 2 Cans of beer per week    Comment:  "couple beers a weekend"   Drug use: Not Currently    Types: Cocaine    Comment: "stopped back in the 1980's"    Home Medications Prior to Admission medications   Medication Sig Start Date End Date Taking? Authorizing Provider  albuterol (VENTOLIN HFA) 108 (90 Base) MCG/ACT inhaler Inhale 2 puffs into the lungs every 6 (six) hours as needed for wheezing or shortness of breath. 07/30/20   Maximiano Coss, NP  amLODipine (NORVASC) 10 MG tablet TAKE 1 TABLET (10 MG TOTAL) BY MOUTH DAILY. 09/20/18   Daleen Squibb, MD  baclofen  (LIORESAL) 10 MG tablet Take 10 mg by mouth 2 (two) times daily as needed for muscle spasms. 07/11/20   [provider]  buprenorphine (BUTRANS) 10 MCG/HR [place] 1  patch onto the skin once a week. 08/12/20   [provider]  cloNIDine (CATAPRES) 0.1 MG tablet Take 0.1 mg by mouth 2 (two) times daily as needed. 07/15/20   [provider]  cyclobenzaprine (FLEXERIL) 5 MG tablet Take 5 mg by mouth 3 (three) times daily as needed for muscle spasms. 09/18/20   [provider]  hydrALAZINE (APRESOLINE) 25 MG tablet Take 1 tablet (25 mg total) by mouth 2 (two) times daily. 01/05/20   Jacelyn Pi, Lilia Argue, MD  loperamide (IMODIUM) 2 MG capsule Take 2 mg by mouth 2 (two) times daily as needed for diarrhea or loose stools. 07/15/20   [provider]  metoprolol tartrate (LOPRESSOR) 50 MG tablet Take 50 mg by mouth 2 (two) times daily. 08/26/20   [provider]  mirtazapine (REMERON) 7.5 MG tablet Take 1 tablet (7.5 mg total) by mouth at bedtime. 07/15/20   Maximiano Coss, NP  [facility] 4 MG/0.1ML LIQD nasal spray kit Place 1 spray into the nose as needed for opioid reversal. If no response within 3 minutes then repeat once in the other nostril.  Call 911. 05/15/20   [provider]  ondansetron (ZOFRAN) 4 MG tablet Take 1 tablet (4 mg total) by mouth every 8 (eight) hours as needed for nausea or vomiting. 08/03/19   Jacelyn Pi, Lilia Argue, MD  oxyCODONE-acetaminophen (PERCOCET) 10-325 MG tablet Take 1 tablet by mouth 3 (three) times daily as needed for severe pain. 09/18/20   [provider]  rosuvastatin (CRESTOR) 10 MG tablet TAKE 1 TABLET (10 MG TOTAL) BY MOUTH DAILY. 07/03/20 10/01/20  Patwardhan, Reynold Bowen, MD  warfarin (COUMADIN) 6 MG tablet TAKE 1 TABLET (6 MG TOTAL) BY MOUTH 4 (FOUR) TIMES A WEEK. 07/04/20   Maximiano Coss, NP  warfarin (COUMADIN) 7.5 MG tablet Take 1 tablet (7.5 mg total) by mouth 3 (three) times a week. 05/15/20   Maximiano Coss,  NP    Allergies    Shrimp [shellfish allergy], Bactroban [mupirocin], Vicodin [hydrocodone-acetaminophen], Eliquis [apixaban], Lisinopril, and Tylenol [acetaminophen]  Review of Systems   Review of Systems  Constitutional:  Negative for chills and fever.  HENT: Negative.  Negative for congestion.   Respiratory:  Positive for shortness of breath. Negative for cough.   Cardiovascular: Negative.  Negative for chest pain.  Gastrointestinal: Negative.  Negative for abdominal pain, nausea and vomiting.  Musculoskeletal: Negative.  Negative for myalgias.  Skin: Negative.   Neurological: Negative.    Physical Exam Updated Vital Signs BP (!) 198/104   Pulse 86   Temp 98.4 F (36.9 C) (Oral)   Resp 19   Ht 5' 3"  (1.6 m)   Wt 42 kg   SpO2 99%   BMI 16.40 kg/m   Physical Exam Vitals and nursing note reviewed.  Constitutional:      Appearance: She is well-developed.     Comments: Chronically ill appearing.  HENT:     Head: Normocephalic.  Cardiovascular:     Rate and Rhythm: Normal rate and regular rhythm.     Heart sounds: No murmur heard. Pulmonary:     Effort: Pulmonary effort is normal.     Breath sounds: Examination of the right-lower field reveals rales. Examination of the left-lower field reveals rales. Rales present. No wheezing or rhonchi.  Chest:     Chest wall: No tenderness.  Abdominal:     General: Bowel sounds are normal.     Palpations: Abdomen is soft.     Tenderness: There  is no abdominal tenderness. There is no guarding or rebound.  Musculoskeletal:        General: Normal range of motion.     Cervical back: Normal range of motion and neck supple.     Right lower leg: No edema.     Left lower leg: No edema.  Skin:    General: Skin is warm and dry.  Neurological:     General: No focal deficit present.     Mental Status: She is alert and oriented to person, place, and time.    ED Results / Procedures / Treatments   Labs (all labs ordered are listed,  but only abnormal results are displayed) Labs Reviewed  CBC WITH DIFFERENTIAL/PLATELET - Abnormal; Notable for the following components:      Result Value   RBC 2.84 (*)    Hemoglobin 8.5 (*)    HCT 27.3 (*)    RDW 17.2 (*)    All other components within normal limits  COMPREHENSIVE METABOLIC PANEL - Abnormal; Notable for the following components:   CO2 16 (*)    BUN 106 (*)    Creatinine, Ser 14.83 (*)    Calcium 8.6 (*)    GFR, Estimated 2 (*)    All other components within normal limits  I-STAT CHEM 8, ED - Abnormal; Notable for the following components:   Chloride 114 (*)    BUN 99 (*)    Creatinine, Ser 12.90 (*)    Calcium, Ion 0.95 (*)    TCO2 14 (*)    Hemoglobin 7.5 (*)    HCT 22.0 (*)    All other components within normal limits  TROPONIN I (HIGH SENSITIVITY) - Abnormal; Notable for the following components:   Troponin I (High Sensitivity) 180 (*)    All other components within normal limits  RESP PANEL BY RT-PCR (FLU A&B, COVID) ARPGX2  TROPONIN I (HIGH SENSITIVITY)    EKG EKG Interpretation  Date/Time:  Wednesday November 27 2020 23:46:41 EDT Ventricular Rate:  86 PR Interval:  154 QRS Duration: 82 QT Interval:  396 QTC Calculation: 473 R Axis:   -4 Text Interpretation: Sinus rhythm with Premature atrial complexes Nonspecific ST abnormality Abnormal ECG Confirmed by Veryl Speak [phone]) on 11/28/2020 1:37:58 AM  Radiology DG Chest 2 View  Result Date: 11/28/2020 CLINICAL DATA:  Shortness of breath EXAM: CHEST - 2 VIEW COMPARISON:  09/21/2020 FINDINGS: Right dialysis catheter in place with the tip in the right atrium. Prior CABG. Cardiomegaly. Bibasilar opacities, likely atelectasis. No effusions or edema. IMPRESSION: Cardiomegaly.  Bibasilar atelectasis. Electronically Signed   By: Rolm Baptise M.D.   On: 11/28/2020 00:36    Procedures Procedures   Medications Ordered in ED Medications  albuterol (VENTOLIN HFA) 108 (90 Base) MCG/ACT inhaler 2 puff (has no  administration in time range)    ED Course  I have reviewed the triage vital signs and the nursing notes.  Pertinent labs & imaging results that were available during my care of the patient were reviewed by me and considered in my medical decision making (see chart for details).    MDM Rules/Calculators/A&P                          Patient to ED with c/o SOB since this morning. No fever, cough, chest pain. Has missed several sessions of dialysis.   The patient is overall stable appearing. No hypoxia. She has no respiratory distress. She is sleeping on  initial exam. No evidence of fluid overload - no peripheral edema, CXR without edema, no tachypnea.   Troponin is elevated tonight at 180, 184. Her last recorded troponins have been elevated as well. No concerning EKG changes, no chest pain. Feel the continued elevation due to missing dialysis, hypertensive secondary to missing medications. This was discussed with Dr. Stark Jock.   Albuterol inhaler provided. SOB is improved. She does not appear to need urgent dialysis tonight. She is encouraged to contact her dialysis center to schedule her regular treatment. She is also encouraged to take her medications as prescribed.    Final Clinical Impression(s) / ED Diagnoses Final diagnoses:  None   Dyspnea   Rx / DC Orders ED Discharge Orders     None        Charlann Lange, PA-C 11/28/20 0255    Charlann Lange, PA-C 11/28/20 [id number]    Veryl Speak, MD 11/28/20 [phone]

## 2020-11-28 NOTE — ED Provider Notes (Signed)
Emergency Medicine Provider Triage Evaluation Note  Monique Henderson , a 70 y.o. female  was evaluated in triage.  Pt complains of shortness of breath, onset today.  She is also endorsing swelling to her bilateral ankles.  No chest pain.  No fever or chills.  The patient is a Tuesday, Thursday, Saturday dialysis patient.  Last dialysis session was 6/21.  States that she is not have any gas in her car and did have difficulty with [place].  Review of Systems  Positive: Shortness of breath, leg swelling Negative: Chest pain, fever, chills, vomiting, abdominal pain, abdominal distention  Physical Exam  BP (!) 201/128 (BP Location: Right Arm)   Pulse 81   Temp 98.3 F (36.8 C) (Oral)   Resp 16   Ht 5\' 3"  (1.6 m)   Wt 42 kg   SpO2 95%   BMI 16.40 kg/m  Gen:   Awake, no distress   Resp:  Normal effort  MSK:   Moves extremities without difficulty  Other:  Peripheral edema  Medical Decision Making  Medically screening exam initiated at 12:06 AM.  Appropriate orders placed.  [name] was informed that the remainder of the evaluation will be completed by another provider, this initial triage assessment does not replace that evaluation, and the importance of remaining in the ED until their evaluation is complete.  70 year old female presenting with shortness of breath and bilateral lower extremity swelling.  She has not been dialyzed since June 21.  No chest pain.  She is markedly hypertensive.  Vital signs are otherwise unremarkable.  Oxygen is 95 to 96% on room air.  Will order EKG, labs, chest x-ray, and COVID-19 testing.  She will require further work-up in the emergency department.   [name], PA-C 11/28/20 [name], April, MD 11/28/20 1165

## 2020-12-23 ENCOUNTER — Observation Stay (HOSPITAL_COMMUNITY)
Admission: EM | Admit: 2020-12-23 | Discharge: 2020-12-24 | Disposition: A | Discharge status: Home / Self Care | Payer: Medicare Other | Attending: Internal Medicine | Admitting: Internal Medicine

## 2020-12-23 ENCOUNTER — Encounter (HOSPITAL_COMMUNITY): Payer: Self-pay | Admitting: Internal Medicine

## 2020-12-23 ENCOUNTER — Emergency Department (HOSPITAL_COMMUNITY): Payer: Medicare Other

## 2020-12-23 DIAGNOSIS — R0602 Shortness of breath: Secondary | ICD-10-CM | POA: Diagnosis present

## 2020-12-23 DIAGNOSIS — N186 End stage renal disease: Secondary | ICD-10-CM

## 2020-12-23 DIAGNOSIS — Z87891 Personal history of nicotine dependence: Secondary | ICD-10-CM | POA: Insufficient documentation

## 2020-12-23 DIAGNOSIS — I120 Hypertensive chronic kidney disease with stage 5 chronic kidney disease or end stage renal disease: Secondary | ICD-10-CM | POA: Diagnosis not present

## 2020-12-23 DIAGNOSIS — Z20822 Contact with and (suspected) exposure to covid-19: Secondary | ICD-10-CM | POA: Insufficient documentation

## 2020-12-23 DIAGNOSIS — Z7901 Long term (current) use of anticoagulants: Secondary | ICD-10-CM | POA: Diagnosis not present

## 2020-12-23 DIAGNOSIS — J449 Chronic obstructive pulmonary disease, unspecified: Secondary | ICD-10-CM | POA: Diagnosis not present

## 2020-12-23 DIAGNOSIS — I160 Hypertensive urgency: Secondary | ICD-10-CM | POA: Diagnosis present

## 2020-12-23 DIAGNOSIS — I480 Paroxysmal atrial fibrillation: Secondary | ICD-10-CM | POA: Diagnosis not present

## 2020-12-23 DIAGNOSIS — I169 Hypertensive crisis, unspecified: Secondary | ICD-10-CM | POA: Diagnosis present

## 2020-12-23 DIAGNOSIS — Z992 Dependence on renal dialysis: Secondary | ICD-10-CM | POA: Diagnosis not present

## 2020-12-23 DIAGNOSIS — Z853 Personal history of malignant neoplasm of breast: Secondary | ICD-10-CM | POA: Diagnosis not present

## 2020-12-23 DIAGNOSIS — F17200 Nicotine dependence, unspecified, uncomplicated: Secondary | ICD-10-CM | POA: Diagnosis present

## 2020-12-23 DIAGNOSIS — Z79899 Other long term (current) drug therapy: Secondary | ICD-10-CM | POA: Insufficient documentation

## 2020-12-23 DIAGNOSIS — J9600 Acute respiratory failure, unspecified whether with hypoxia or hypercapnia: Secondary | ICD-10-CM | POA: Diagnosis not present

## 2020-12-23 DIAGNOSIS — M545 Low back pain, unspecified: Secondary | ICD-10-CM | POA: Diagnosis present

## 2020-12-23 DIAGNOSIS — G8929 Other chronic pain: Secondary | ICD-10-CM | POA: Diagnosis present

## 2020-12-23 DIAGNOSIS — E440 Moderate protein-calorie malnutrition: Secondary | ICD-10-CM | POA: Diagnosis present

## 2020-12-23 DIAGNOSIS — I4891 Unspecified atrial fibrillation: Secondary | ICD-10-CM

## 2020-12-23 DIAGNOSIS — I10 Essential (primary) hypertension: Secondary | ICD-10-CM | POA: Diagnosis present

## 2020-12-23 DIAGNOSIS — J810 Acute pulmonary edema: Secondary | ICD-10-CM | POA: Diagnosis present

## 2020-12-23 DIAGNOSIS — I161 Hypertensive emergency: Secondary | ICD-10-CM

## 2020-12-23 LAB — BASIC METABOLIC PANEL
Anion gap: 12 (ref 5–15)
BUN: 27 mg/dL — ABNORMAL HIGH (ref 8–23)
CO2: 26 mmol/L (ref 22–32)
Calcium: 9.7 mg/dL (ref 8.9–10.3)
Chloride: 101 mmol/L (ref 98–111)
Creatinine, Ser: 6.92 mg/dL — ABNORMAL HIGH (ref 0.44–1.00)
GFR, Estimated: 6 mL/min — ABNORMAL LOW (ref >60)
Glucose, Bld: 103 mg/dL — ABNORMAL HIGH (ref 70–99)
Potassium: 4.1 mmol/L (ref 3.5–5.1)
Sodium: 139 mmol/L (ref 135–145)

## 2020-12-23 LAB — CBC WITH DIFFERENTIAL/PLATELET
Abs Immature Granulocytes: 0.02 10*3/uL (ref 0.00–0.07)
Basophils Absolute: 0.1 10*3/uL (ref 0.0–0.1)
Basophils Relative: 1 %
Eosinophils Absolute: 0.2 10*3/uL (ref 0.0–0.5)
Eosinophils Relative: 3 %
HCT: 37.7 % (ref 36.0–46.0)
Hemoglobin: 11.2 g/dL — ABNORMAL LOW (ref 12.0–15.0)
Immature Granulocytes: 0 %
Lymphocytes Relative: 14 %
Lymphs Abs: 1 10*3/uL (ref 0.7–4.0)
MCH: 30.1 pg (ref 26.0–34.0)
MCHC: 29.7 g/dL — ABNORMAL LOW (ref 30.0–36.0)
MCV: 101.3 fL — ABNORMAL HIGH (ref 80.0–100.0)
Monocytes Absolute: 0.5 10*3/uL (ref 0.1–1.0)
Monocytes Relative: 7 %
Neutro Abs: 5.3 10*3/uL (ref 1.7–7.7)
Neutrophils Relative %: 75 %
Platelets: 281 10*3/uL (ref 150–400)
RBC: 3.72 MIL/uL — ABNORMAL LOW (ref 3.87–5.11)
RDW: 18 % — ABNORMAL HIGH (ref 11.5–15.5)
WBC: 7 10*3/uL (ref 4.0–10.5)
nRBC: 0 % (ref 0.0–0.2)

## 2020-12-23 LAB — RESP PANEL BY RT-PCR (FLU A&B, COVID) ARPGX2
Influenza A by PCR: NEGATIVE
Influenza B by PCR: NEGATIVE
SARS Coronavirus 2 by RT PCR: NEGATIVE

## 2020-12-23 LAB — PROTIME-INR
INR: 0.9 (ref 0.8–1.2)
Prothrombin Time: 12.4 seconds (ref 11.4–15.2)

## 2020-12-23 MED ORDER — CLONIDINE HCL 0.3 MG/24HR TD PTWK
0.3000 mg | MEDICATED_PATCH | TRANSDERMAL | Status: DC
  Administered 2020-12-23: 0.3 mg via TRANSDERMAL
  Filled 2020-12-23: qty 1

## 2020-12-23 MED ORDER — CAMPHOR-MENTHOL 0.5-0.5 % EX LOTN: 1.0000 "application " | TOPICAL_LOTION | Freq: Three times a day (TID) | CUTANEOUS | Status: DC | PRN

## 2020-12-23 MED ORDER — HYDROMORPHONE HCL 1 MG/ML IJ SOLN: 0.5000 mg | Freq: Once | INTRAMUSCULAR | Status: AC

## 2020-12-23 MED ORDER — HEPARIN SODIUM (PORCINE) 1000 UNIT/ML DIALYSIS
2000.0000 [IU] | INTRAMUSCULAR | Status: DC | PRN
  Filled 2020-12-23: qty 2

## 2020-12-23 MED ORDER — CYCLOBENZAPRINE HCL 5 MG PO TABS: 5.0000 mg | ORAL_TABLET | Freq: Three times a day (TID) | ORAL | Status: DC | PRN

## 2020-12-23 MED ORDER — WARFARIN - PHARMACIST DOSING INPATIENT: Freq: Every day | Status: DC

## 2020-12-23 MED ORDER — CALCIUM CARBONATE ANTACID 1250 MG/5ML PO SUSP: 500.0000 mg | Freq: Four times a day (QID) | ORAL | Status: DC | PRN

## 2020-12-23 MED ORDER — DOCUSATE SODIUM 100 MG PO CAPS
100.0000 mg | ORAL_CAPSULE | Freq: Two times a day (BID) | ORAL | Status: DC
  Administered 2020-12-23: 100 mg via ORAL
  Filled 2020-12-23 (×2): qty 1

## 2020-12-23 MED ORDER — HEPARIN SODIUM (PORCINE) 1000 UNIT/ML IJ SOLN
INTRAMUSCULAR | Status: AC
  Filled 2020-12-23: qty 4

## 2020-12-23 MED ORDER — ROSUVASTATIN CALCIUM 5 MG PO TABS
10.0000 mg | ORAL_TABLET | Freq: Every day | ORAL | Status: DC
  Administered 2020-12-23 – 2020-12-24 (×2): 10 mg via ORAL
  Filled 2020-12-23 (×2): qty 2

## 2020-12-23 MED ORDER — AMLODIPINE BESYLATE 10 MG PO TABS
10.0000 mg | ORAL_TABLET | Freq: Every day | ORAL | Status: DC
  Administered 2020-12-23 – 2020-12-24 (×2): 10 mg via ORAL
  Filled 2020-12-23 (×2): qty 2

## 2020-12-23 MED ORDER — ALBUTEROL SULFATE (2.5 MG/3ML) 0.083% IN NEBU: 2.5000 mg | INHALATION_SOLUTION | RESPIRATORY_TRACT | Status: DC | PRN

## 2020-12-23 MED ORDER — ALBUTEROL SULFATE (2.5 MG/3ML) 0.083% IN NEBU: 2.5000 mg | INHALATION_SOLUTION | Freq: Four times a day (QID) | RESPIRATORY_TRACT | Status: DC | PRN

## 2020-12-23 MED ORDER — ZOLPIDEM TARTRATE 5 MG PO TABS: 5.0000 mg | ORAL_TABLET | Freq: Every evening | ORAL | Status: DC | PRN

## 2020-12-23 MED ORDER — CHLORHEXIDINE GLUCONATE CLOTH 2 % EX PADS
6.0000 | MEDICATED_PAD | Freq: Every day | CUTANEOUS | Status: DC
  Administered 2020-12-23: 6 via TOPICAL

## 2020-12-23 MED ORDER — HYDROMORPHONE HCL 1 MG/ML IJ SOLN
INTRAMUSCULAR | Status: AC
  Administered 2020-12-23: 0.5 mg via INTRAVENOUS
  Filled 2020-12-23: qty 0.5

## 2020-12-23 MED ORDER — SODIUM CHLORIDE 0.9% FLUSH
3.0000 mL | Freq: Two times a day (BID) | INTRAVENOUS | Status: DC
  Administered 2020-12-24: 3 mL via INTRAVENOUS

## 2020-12-23 MED ORDER — HYDROXYZINE HCL 25 MG PO TABS: 25.0000 mg | ORAL_TABLET | Freq: Three times a day (TID) | ORAL | Status: DC | PRN

## 2020-12-23 MED ORDER — HYDRALAZINE HCL 20 MG/ML IJ SOLN
5.0000 mg | INTRAMUSCULAR | Status: DC | PRN
  Administered 2020-12-24: 5 mg via INTRAVENOUS
  Filled 2020-12-23: qty 1

## 2020-12-23 MED ORDER — NEPRO/CARBSTEADY PO LIQD: 237.0000 mL | Freq: Three times a day (TID) | ORAL | Status: DC | PRN

## 2020-12-23 MED ORDER — LOSARTAN POTASSIUM 50 MG PO TABS
100.0000 mg | ORAL_TABLET | Freq: Every day | ORAL | Status: DC
  Administered 2020-12-23: 100 mg via ORAL
  Filled 2020-12-23: qty 2

## 2020-12-23 MED ORDER — WARFARIN SODIUM 7.5 MG PO TABS
7.5000 mg | ORAL_TABLET | Freq: Once | ORAL | Status: AC
  Administered 2020-12-23: 7.5 mg via ORAL
  Filled 2020-12-23: qty 1

## 2020-12-23 MED ORDER — MIRTAZAPINE 15 MG PO TABS: 7.5000 mg | ORAL_TABLET | Freq: Every day | ORAL | Status: DC

## 2020-12-23 MED ORDER — NITROGLYCERIN IN D5W 200-5 MCG/ML-% IV SOLN
0.0000 ug/min | INTRAVENOUS | Status: DC
  Administered 2020-12-23: 5 ug/min via INTRAVENOUS
  Filled 2020-12-23: qty 250

## 2020-12-23 MED ORDER — ONDANSETRON HCL 4 MG/2ML IJ SOLN: 4.0000 mg | Freq: Once | INTRAMUSCULAR | Status: AC

## 2020-12-23 MED ORDER — METOPROLOL TARTRATE 50 MG PO TABS
50.0000 mg | ORAL_TABLET | Freq: Two times a day (BID) | ORAL | Status: DC
  Administered 2020-12-23 – 2020-12-24 (×3): 50 mg via ORAL
  Filled 2020-12-23 (×3): qty 2

## 2020-12-23 MED ORDER — DOCUSATE SODIUM 283 MG RE ENEM
1.0000 | ENEMA | RECTAL | Status: DC | PRN
  Filled 2020-12-23: qty 1

## 2020-12-23 MED ORDER — NICOTINE 14 MG/24HR TD PT24
14.0000 mg | MEDICATED_PATCH | Freq: Every day | TRANSDERMAL | Status: DC
  Administered 2020-12-23 – 2020-12-24 (×2): 14 mg via TRANSDERMAL
  Filled 2020-12-23 (×2): qty 1

## 2020-12-23 MED ORDER — BUPRENORPHINE 10 MCG/HR TD PTWK: 1.0000 | MEDICATED_PATCH | TRANSDERMAL | Status: DC

## 2020-12-23 MED ORDER — ONDANSETRON HCL 4 MG PO TABS: 4.0000 mg | ORAL_TABLET | Freq: Four times a day (QID) | ORAL | Status: DC | PRN

## 2020-12-23 MED ORDER — LOSARTAN POTASSIUM 50 MG PO TABS
100.0000 mg | ORAL_TABLET | Freq: Every day | ORAL | Status: DC
  Administered 2020-12-23 – 2020-12-24 (×2): 100 mg via ORAL
  Filled 2020-12-23 (×2): qty 2

## 2020-12-23 MED ORDER — ONDANSETRON HCL 4 MG/2ML IJ SOLN
INTRAMUSCULAR | Status: AC
  Administered 2020-12-23: 4 mg via INTRAVENOUS
  Filled 2020-12-23: qty 2

## 2020-12-23 MED ORDER — HEPARIN SODIUM (PORCINE) 5000 UNIT/ML IJ SOLN: 5000.0000 [IU] | Freq: Three times a day (TID) | INTRAMUSCULAR | Status: DC

## 2020-12-23 MED ORDER — OXYCODONE-ACETAMINOPHEN 5-325 MG PO TABS
1.0000 | ORAL_TABLET | Freq: Three times a day (TID) | ORAL | Status: DC | PRN
  Administered 2020-12-23 – 2020-12-24 (×2): 1 via ORAL
  Filled 2020-12-23 (×3): qty 1

## 2020-12-23 MED ORDER — ONDANSETRON HCL 4 MG/2ML IJ SOLN: 4.0000 mg | Freq: Four times a day (QID) | INTRAMUSCULAR | Status: DC | PRN

## 2020-12-23 MED ORDER — SORBITOL 70 % SOLN
30.0000 mL | Status: DC | PRN
  Filled 2020-12-23: qty 30

## 2020-12-23 MED ORDER — HYDRALAZINE HCL 25 MG PO TABS
25.0000 mg | ORAL_TABLET | Freq: Two times a day (BID) | ORAL | Status: DC
  Administered 2020-12-23 – 2020-12-24 (×3): 25 mg via ORAL
  Filled 2020-12-23 (×3): qty 1

## 2020-12-23 NOTE — Progress Notes (Signed)
ANTICOAGULATION CONSULT NOTE - Initial Consult  Pharmacy Consult for Warfarin Indication: atrial fibrillation  Allergies  Allergen Reactions   Shrimp [Shellfish Allergy] Shortness Of Breath   Bactroban [Mupirocin] Other (See Comments)    "Sores in nose"   Vicodin [Hydrocodone-Acetaminophen] Itching and Nausea And Vomiting    This is patient's home medication   Eliquis [Apixaban] Rash   Lisinopril Cough   Tylenol [Acetaminophen] Itching    Patient Measurements: Height: 5\' 3"  (160 cm) Weight:  (unable to check stetcher wt) IBW/kg (Calculated) : 52.4  Vital Signs: Temp: 97.8 F (36.6 C) (07/25 1120) Temp Source: Oral (07/25 1120) BP: 177/131 (07/25 1120) Pulse Rate: 96 (07/25 1120)  Labs: Recent Labs    12/23/20 0403  HGB 11.2*  HCT 37.7  PLT 281  LABPROT 12.4  INR 0.9  CREATININE 6.92*    Estimated Creatinine Clearance: 5.5 mL/min (A) (by C-G formula based on SCr of 6.92 mg/dL (H)).   Medical History: Past Medical History:  Diagnosis Date   AF (paroxysmal atrial fibrillation) ([place]) 05/29/2019   on Coumadin   Aortic atherosclerosis ([place]) 07/05/2019   Aortic dissection ([place]) 04/04/2019   s/p repair   Bone spur 2008   Right calcaneal foot spur   Breast cancer ([place]) 2004   Ductal carcinoma in situ of the left breast; S/P left partial mastectomy 02/26/2003; S/P re-excision of cranial and lateral margins11/18/2004.radiation   Cerebral thrombosis with cerebral infarction 05/22/2019   Chronic low back pain 06/22/2016   Chronic obstructive lung disease ([place]) 01/16/2017   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 Autumn Messing; S/P re-excision of superior and inferior margins 10/27/2012.    ESRD on dialysis ([place]) 05/29/2019   Essential hypertension 09/16/2006   GERD 09/16/2006   Hepatitis C    treated and RNA confirmed not detectable 01/2017   Insomnia 03/14/2015   Malnutrition of moderate degree 05/19/2019    Non compliance with medical treatment 12/04/2017   Normocytic anemia    With thrombocytosis   Osteoarthritis    Right ureteral stone 2002   S/P lumbar spinal fusion 01/18/2014   S/P lumbar decompressive laminectomy, fusion, and plating for lumbar spinal stenosis on 05/27/2009 by Dr. Eustace Moore.  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 Eustace Moore.     Tobacco use disorder 04/19/2009   Uterine fibroid    Wears dentures    top    Medications:  (Not in a hospital admission)  Scheduled:   amLODipine  10 mg Oral Daily   buprenorphine  1 patch Transdermal Weekly   Chlorhexidine Gluconate Cloth  6 each Topical Q0600   cloNIDine  0.3 mg Transdermal Weekly   hydrALAZINE  25 mg Oral BID   losartan  100 mg Oral Daily   metoprolol tartrate  50 mg Oral BID   mirtazapine  7.5 mg Oral QHS   rosuvastatin  10 mg Oral Daily   Infusions:   Assessment: 50 yof presenting to ED with shortness of breath. History is significant for ESRD on HD, HTN, anemia, pAF, and previous history of aortic dissection with repair. Pharmacy consulted for warfarin for AF.  Patient states she takes 6mg  and 7.5 mg doses alternating days between doses. Patient states she does not take specific doses on specific days. Patient states  last taken 7/24 (6mg  dose). Baseline INR is 0.9.  Goal of Therapy:  INR 2-3 Monitor platelets by anticoagulation protocol: Yes   Plan:  Will give 7.5mg  tonight - pharmacy to enter subsequent dosing per INR Per MD Lorin Mercy, will not bridge with parenteral agent at this time Monitory daily INR and adjust warfarin dosing accordingly  Admitting team considering transition to eliquis if affordable  Lorelei Pont, PharmD, BCPS 12/23/2020 2:14 PM ED Clinical Pharmacist -  [phone]

## 2020-12-23 NOTE — H&P (Signed)
History and Physical    [name] Henderson:[id number] DOB: 10-10-1950 DOA: 12/23/2020  PCP: Maximiano Coss, NP Consultants:  [facility] - vascular surgery; [facility] - cardiology; Nephrology; Leonie Man - neurology Patient coming from:  Home - lives alone; NOK:   Chief Complaint: SOB  HPI: Monique Henderson is a 70 y.o. female with medical history significant of afib; aortic dissection (2020); breast cancer (2004, 2014); CVA; ESRD on TTS HD; COPD; HTN; chronic pain; and tobacco dependence presenting with SOB.  She reports that she has not missed HD and last attended a full session on Saturday.  However, she did not take any medications on Sunday and also has not seen her PCP in a while and so ran out of her BP meds several days ago.  She began feeling SOB yesterday and it worsened overnight.  She currently has a headache and is mildly SOB.    ED Course: Carryover, per Dr. Olevia Bowens:  70 year old female with a past medical history of ESRD on hemodialysis, hypertension who is coming in with significant dyspnea in the setting of pulmonary edema and hypertensive emergency.  Review of Systems: As per HPI; otherwise review of systems reviewed and negative.   Ambulatory Status:  Ambulates without assistance   Past Medical History:  Diagnosis Date   AF (paroxysmal atrial fibrillation) ([place]) 05/29/2019   on Coumadin   Aortic atherosclerosis ([place]) 07/05/2019   Aortic dissection ([place]) 04/04/2019   s/p repair   Bone spur 2008   Right calcaneal foot spur   Breast cancer ([place]) 2004   Ductal carcinoma in situ of the left breast; S/P left partial mastectomy 02/26/2003; S/P re-excision of cranial and lateral margins11/18/2004.radiation   Cerebral thrombosis with cerebral infarction 05/22/2019   Chronic low back pain 06/22/2016   Chronic obstructive lung disease ([place]) 01/16/2017   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 Autumn Messing; S/P re-excision of  superior and inferior margins 10/27/2012.    ESRD on dialysis ([place]) 05/29/2019   Essential hypertension 09/16/2006   GERD 09/16/2006   Hepatitis C    treated and RNA confirmed not detectable 01/2017   Insomnia 03/14/2015   Malnutrition of moderate degree 05/19/2019   Non compliance with medical treatment 12/04/2017   Normocytic anemia    With thrombocytosis   Osteoarthritis    Right ureteral stone 2002   S/P lumbar spinal fusion 01/18/2014   S/P lumbar decompressive laminectomy, fusion, and plating for lumbar spinal stenosis on 05/27/2009 by Dr. Eustace Moore.  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 Eustace Moore.     Tobacco use disorder 04/19/2009   Uterine fibroid    Wears dentures    top    Past Surgical History:  Procedure Laterality Date   ANTERIOR LAT LUMBAR FUSION N/A 01/18/2014   Procedure: ANTERIOR LATERAL LUMBAR FUSION LUMBAR TWO-THREE;  Surgeon: Eustace Moore, MD;  Location: [facility] [facility];  Service: Neurosurgery;  Laterality: N/A;   ANTERIOR LUMBAR FUSION  01/18/2014   AV FISTULA PLACEMENT Left 04/20/2019   Procedure: ARTERIOVENOUS (AV) FISTULA CREATION;  Surgeon: [name], MD;  Location: [place];  Service: Vascular;  Laterality: Left;   BACK SURGERY     BREAST LUMPECTOMY Left 01/2003   BREAST LUMPECTOMY Right 2014   BREAST LUMPECTOMY WITH NEEDLE  LOCALIZATION AND AXILLARY SENTINEL LYMPH NODE BX Right 10/13/2012   Procedure: BREAST LUMPECTOMY WITH NEEDLE LOCALIZATION;  Surgeon: [name], MD;  Location: [facility];  Service: General;  Laterality: Right;  Right breast wire localized lumpectomy   INSERTION OF DIALYSIS CATHETER Right 04/20/2019   Procedure: INSERTION OF DIALYSIS CATHETER, right internal jugular;  Surgeon: [name],  MD;  Location: [place];  Service: Vascular;  Laterality: Right;   INSERTION OF DIALYSIS CATHETER Right 09/24/2020   Procedure: INSERTION OF TUNNELED DIALYSIS CATHETER;  Surgeon: Rosetta Posner, MD;  Location: [place];  Service: Vascular;  Laterality: Right;   IR FLUORO GUIDE CV LINE RIGHT  09/22/2020   IR THORACENTESIS ASP PLEURAL SPACE W/IMG GUIDE  05/19/2019   IR US GUIDE VASC ACCESS LEFT  09/22/2020   IR US GUIDE VASC ACCESS RIGHT  09/22/2020   IR VENOCAVAGRAM SVC  09/22/2020   LAMINECTOMY  05/27/2009   Lumbar decompressive laminectomy, fusion and plating for lumbar spinal stensosis   LIGATION OF ARTERIOVENOUS  FISTULA Left 09/24/2020   Procedure: LIGATION OF LEFT ARM ARTERIOVENOUS  FISTULA;  Surgeon: Rosetta Posner, MD;  Location: [place];  Service: Vascular;  Laterality: Left;   LUMBAR LAMINECTOMY/DECOMPRESSION MICRODISCECTOMY Left 03/23/2013   Procedure: LUMBAR LAMINECTOMY/DECOMPRESSION MICRODISCECTOMY 1 LEVEL;  Surgeon: Eustace Moore, MD;  Location: [facility];  Service: Neurosurgery;  Laterality: Left;  LUMBAR LAMINECTOMY/DECOMPRESSION MICRODISCECTOMY 1 LEVEL   MASTECTOMY, PARTIAL Left 02/26/2003   ; S/P re-excision of cranial and lateral margins 04/19/2003.    RE-EXCISION OF BREAST CANCER,SUPERIOR MARGINS Right 10/27/2012   Procedure: RE-EXCISION OF BREAST CANCER,SUPERIOR and inferior MARGINS;  Surgeon: [name], MD;  Location: [place];  Service: General;  Laterality: Right;   RE-EXCISION OF BREAST LUMPECTOMY Left 04/2003   TEE WITHOUT CARDIOVERSION N/A 04/04/2019   Procedure: Transesophageal Echocardiogram (Tee);  Surgeon: Wonda Olds, MD;  Location: [place];  Service: Open Heart Surgery;  Laterality: N/A;   THORACIC AORTIC ANEURYSM REPAIR N/A 04/04/2019   Procedure: THORACIC ASCENDING ANEURYSM REPAIR (AAA)  USING 28 MM X 30 CM HEMASHIELD PLATINUM VASCULAR GRAFT;  Surgeon: Wonda Olds, MD;  Location: [facility];  Service: Open Heart Surgery;  Laterality: N/A;    Social History    Socioeconomic History   Marital status: Single    Spouse name: Not on file   Number of children: 2   Years of education: Not on file   Highest education level: Not on file  Occupational History   Not on file  Tobacco Use   Smoking status: Former    Packs/day: 0.25    Years: 44.00    Pack years: 11.00    Types: Cigarettes    Quit date: 05/24/2019    Years since quitting: 1.5   Smokeless tobacco: Never   Tobacco comments:    smoking less  Vaping Use   Vaping Use: Never used  Substance and Sexual Activity   Alcohol use: Not Currently    Alcohol/week: 2.0 standard drinks    Types: 2 Cans of beer per week    Comment:  "couple beers a weekend"   Drug use: Not Currently    Types: Cocaine    Comment: "stopped back in the 1980's"   Sexual activity: Yes    Birth control/protection: Post-menopausal  Other Topics Concern   Not on file  Social History Narrative   Not on file   Social Determinants of Health   Financial Resource Strain:  Not on file  Food Insecurity: Not on file  Transportation Needs: Not on file  Physical Activity: Not on file  Stress: Not on file  Social Connections: Not on file  Intimate Partner Violence: Not on file    Allergies  Allergen Reactions   Shrimp [Shellfish Allergy] Shortness Of Breath   Bactroban [Mupirocin] Other (See Comments)    "Sores in nose"   Vicodin [Hydrocodone-Acetaminophen] Itching and Nausea And Vomiting    This is patient's home medication   Eliquis [Apixaban] Rash   Lisinopril Cough   Tylenol [Acetaminophen] Itching    Family History  Problem Relation Age of Onset   Colon cancer Mother 69   Hypertension Mother    Diabetes Sister 68   Hypertension Sister    Diabetes Brother    Hypertension Brother    Diabetes Brother    Hypertension Brother    Kidney disease Son        s/p renal transplant   Hypertension Son    Diabetes Son    Multiple sclerosis Son    Bone cancer Sister 39   Breast cancer Neg Hx    Cervical  cancer Neg Hx     Prior to Admission medications   Medication Sig Start Date End Date Taking? Authorizing Provider  albuterol (VENTOLIN HFA) 108 (90 Base) MCG/ACT inhaler Inhale 2 puffs into the lungs every 6 (six) hours as needed for wheezing or shortness of breath. 07/30/20   Maximiano Coss, NP  amLODipine (NORVASC) 10 MG tablet TAKE 1 TABLET (10 MG TOTAL) BY MOUTH DAILY. 09/20/18   Daleen Squibb, MD  baclofen (LIORESAL) 10 MG tablet Take 10 mg by mouth 2 (two) times daily as needed for muscle spasms. 07/11/20   [provider]  buprenorphine (BUTRANS) 10 MCG/HR Lane 1 patch onto the skin once a week. 08/12/20   [provider]  cloNIDine (CATAPRES) 0.1 MG tablet Take 0.1 mg by mouth 2 (two) times daily as needed. 07/15/20   [provider]  cyclobenzaprine (FLEXERIL) 5 MG tablet Take 5 mg by mouth 3 (three) times daily as needed for muscle spasms. 09/18/20   [provider]  hydrALAZINE (APRESOLINE) 25 MG tablet Take 1 tablet (25 mg total) by mouth 2 (two) times daily. 01/05/20   Jacelyn Pi, Lilia Argue, MD  loperamide (IMODIUM) 2 MG capsule Take 2 mg by mouth 2 (two) times daily as needed for diarrhea or loose stools. 07/15/20   [provider]  metoprolol tartrate (LOPRESSOR) 50 MG tablet Take 50 mg by mouth 2 (two) times daily. 08/26/20   [provider]  mirtazapine (REMERON) 7.5 MG tablet Take 1 tablet (7.5 mg total) by mouth at bedtime. 07/15/20   Maximiano Coss, NP  [facility] 4 MG/0.1ML LIQD nasal spray kit Place 1 spray into the nose as needed for opioid reversal. If no response within 3 minutes then repeat once in the other nostril.  Call 911. 05/15/20   [provider]  ondansetron (ZOFRAN) 4 MG tablet Take 1 tablet (4 mg total) by mouth every 8 (eight) hours as needed for nausea or vomiting. 08/03/19   Jacelyn Pi, Lilia Argue, MD  oxyCODONE-acetaminophen (PERCOCET) 10-325 MG tablet Take 1 tablet by mouth 3 (three) times daily as  needed for severe pain. 09/18/20   [provider]  rosuvastatin (CRESTOR) 10 MG tablet TAKE 1 TABLET (10 MG TOTAL) BY MOUTH DAILY. 07/03/20 10/01/20  Patwardhan, Reynold Bowen, MD  warfarin (COUMADIN) 6 MG tablet TAKE 1  TABLET (6 MG TOTAL) BY MOUTH 4 (FOUR) TIMES A WEEK. 07/04/20   Maximiano Coss, NP  warfarin (COUMADIN) 7.5 MG tablet Take 1 tablet (7.5 mg total) by mouth 3 (three) times a week. 05/15/20   Maximiano Coss, NP    Physical Exam: Vitals:   12/23/20 1000 12/23/20 1027 12/23/20 1054 12/23/20 1120  BP: (!) 176/113 (!) 188/119 (!) 186/113 (!) 177/131  Pulse: 92 94 94 96  Resp: (!) 21 (!) 24 (!) 21 18  Temp:    97.8 F (36.6 C)  TempSrc:    Oral  SpO2:    92%  Weight:      Height:         General:  Appears calm and comfortable and is in NAD, thin/frail Eyes:  EOMI, normal lids, iris ENT:  grossly normal hearing, lips & tongue, mmm Neck:  no LAD, masses or thyromegaly Cardiovascular:  RRR, no m/r/g. 1+ L > R LE edema. Sternotomy scar well healed. Respiratory:   Scattered basilar crackles.  Mildly increased respiratory effort. Abdomen:  soft, NT, ND Skin:  no rash or induration seen on limited exam Musculoskeletal:  grossly normal tone BUE/BLE, good ROM, no bony abnormality Lower extremity:  1+ L > R LE edema.  Limited foot exam with no ulcerations.  2+ distal pulses. Psychiatric:  blunted/emotionally labile mood and affect, speech fluent and appropriate, AOx3 Neurologic:  CN 2-12 grossly intact, moves all extremities in coordinated fashion    Radiological Exams on Admission: Independently reviewed - see discussion in A/P where applicable  DG Chest Port 1 View  Result Date: 12/23/2020 CLINICAL DATA:  Chest pain EXAM: PORTABLE CHEST 1 VIEW COMPARISON:  11/29/2018 FINDINGS: Cardiopericardial enlargement. Dialysis catheter with tip at the right atrium. Diffuse interstitial thickening but similar to prior. No visible effusion or pneumothorax. IMPRESSION: Cardiomegaly and  borderline interstitial edema. Electronically Signed   By: Monte Fantasia M.D.   On: 12/23/2020 04:28    EKG: Independently reviewed.  Sinus tachycardia with rate 108; nonspecific ST changes with no evidence of acute ischemia   Labs on Admission: I have personally reviewed the available labs and imaging studies at the time of the admission.  Pertinent labs:   BUN 27/Creatinine 6.92/GFR 6 WBC 7.0 Hgb 11.2 INR 0.9 COVID/flu negative   Assessment/Plan Principal Problem:   Hypertensive urgency Active Problems:   Tobacco use disorder   Chronic low back pain   Chronic obstructive lung disease (HCC)   Malnutrition of moderate degree   ESRD on dialysis ([place])   On Coumadin for atrial fibrillation (HCC)   Acute pulmonary edema (HCC)   Hypertensive crisis with pulmonary edema -Patient presenting with SOB and markedly elevated BP concerning for hypertensive crisis -She did have evidence of end organ failure, with pulmonary edema -She was started on NTG drip but this has been stopped due to improved BP and marked headache -She was taken for urgent HD but was only able to tolerate 2 hours due to cramping and HA -Her crisis likely stemmed from recent inability to obtain BP medications as well as lack of medications on 7/24 or 25 at all -Based on BP control no longer in a dangerous range, will observe patient on telemetry in progressive care rather than placing in ICU -Will resume home BP meds - Cozaar 100 mg daily; Norvasc 10 mg daily; Hydralazine 25 mg BID; Lopressor 50 mg BID; and Clonidine 0.3 transdermal -Will add prn hydralazine   ESRD on HD -Patient on chronic TTS HD -Nephrology  prn order set utilized -Patient was dialyzed this AM  COPD with ongoing tobacco dependence -Continue Albuterol prn -Tobacco Dependence: encourage cessation.   -This was discussed with the patient and should be reviewed on an ongoing basis.   -Patch ordered   Chronic pain -Previously on buprenorphine  but no longer -Has listed rx for prn Percocet -Continue Remeron  Afib -Rate controlled with Lopressor -Has been taking Coumadin for AC but last 3 INRs have not been therapeutic -Given her h/o non-compliance and the logistical challenges associated with Coumadin, would recommend transition to Eliquis if able to afford (pharmacist is looking into this)  Malnutrition -Body mass index is 17.36 kg/m..  -The patient has at least 2 indicators for malnutrition (insufficient energy intake, weight loss, loss of muscle mass, loss of subcutaneous fat).  -This is likely due to starvation related/chronic disease -Will obtain a nutrition consult for further recommendations.       Note: This patient has been tested and is negative for the novel coronavirus COVID-19. The patient has been fully vaccinated against COVID-19.   Level of care: Progressive DVT prophylaxis: Heparin Code Status:  Full - confirmed with patient Family Communication: None present Disposition Plan:  The patient is from: home  Anticipated d/c is to: home without [facility] services   Anticipated d/c date will depend on clinical response to treatment, but possibly as early as tomorrow if she has excellent response to treatment  Patient is currently: acutely ill Consults called: Nephrology; nutrition  Admission status:  It is my clinical opinion that referral for OBSERVATION is reasonable and necessary in this patient based on the above information provided. The aforementioned taken together are felt to place the patient at high risk for further clinical deterioration. However it is anticipated that the patient may be medically stable for discharge from the hospital within 24 to 48 hours.    Karmen Bongo MD [facility]   How to contact the [facility] Attending or Consulting provider [place] or covering provider during after hours [place], for this patient?  Check the care team in [facility] and look for a) attending/consulting [facility]  provider listed and b) the [facility] team listed Log into [email or web address] and use [place] universal password to access. If you do not have the password, please contact the hospital operator. Locate the [facility] provider you are looking for under [facility] and page to a number that you can be directly reached. If you still have difficulty reaching the provider, please page the [facility] (Director on Call) for the Hospitalists listed on amion for assistance.   12/23/2020, 2:46 PM

## 2020-12-23 NOTE — ED Triage Notes (Signed)
Pt BIB EMS from home. SOB and cough onset Sunday evening. Tues, Thurs, Sat dialysis without missed appointments. Has not taken antihypertensive medications due to not having medication refills since Sunday.  EMS vitals: 227/150 HR 110 Baseline room air, 98% 2L [place] RR 30

## 2020-12-23 NOTE — Procedures (Signed)
   I was present at this dialysis session, have reviewed the session itself and made  appropriate changes Kelly Splinter MD [place] pager [phone]   12/23/2020, 10:46 AM

## 2020-12-23 NOTE — Consult Note (Addendum)
Renal Service Consult Note [facility]  Monique Henderson 12/23/2020 Sol Blazing, MD Requesting Physician: Dr. Lorin Mercy  Reason for Consult: ESRD pt w/ SOB/ resp distress HPI: The patient is a 70 y.o. year-old w/ hx of PAF, sp aortic dissection repair, hx breast cancer, COPD, ESRd on HD, HTN, anemia came to ED this am w/ SOB and cough onset last night. Has HD TTS, has not missed. BP very high in ED and CXR showing pulm edema. Asked to see for dialysis.   Pt was on bipap for a while then took it off and was doing well on [place] O2 so it was left off. IV ntg started for high BP's in ED.  Pt taken to HD and after 2 hrs UF started c/o cramping and also very bad HA.  IV ntg dc'd d/t headache. Giving some IV dilaudid and zofran.  SOB is "much better".    ROS - denies CP, no joint pain, no HA, no blurry vision, no rash, no diarrhea, no nausea/ vomiting, no dysuria, no difficulty voiding   Past Medical History  Past Medical History:  Diagnosis Date   AF (paroxysmal atrial fibrillation) ([place]) 05/29/2019   on Coumadin   Aortic atherosclerosis ([place]) 07/05/2019   Aortic dissection ([place]) 04/04/2019   s/p repair   Bone spur 2008   Right calcaneal foot spur   Breast cancer ([place]) 2004   Ductal carcinoma in situ of the left breast; S/P left partial mastectomy 02/26/2003; S/P re-excision of cranial and lateral margins11/18/2004.radiation   Cerebral thrombosis with cerebral infarction 05/22/2019   Chronic low back pain 06/22/2016   Chronic obstructive lung disease ([place]) 01/16/2017   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 Autumn Messing; S/P re-excision of superior and inferior margins 10/27/2012.    ESRD on dialysis ([place]) 05/29/2019   Essential hypertension 09/16/2006   GERD 09/16/2006   Hepatitis C    treated and RNA confirmed not detectable 01/2017   Insomnia 03/14/2015   Malnutrition of moderate degree 05/19/2019   Non  compliance with medical treatment 12/04/2017   Normocytic anemia    With thrombocytosis   Osteoarthritis    Right ureteral stone 2002   S/P lumbar spinal fusion 01/18/2014   S/P lumbar decompressive laminectomy, fusion, and plating for lumbar spinal stenosis on 05/27/2009 by Dr. Eustace Moore.  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 Eustace Moore.     Tobacco use disorder 04/19/2009   Uterine fibroid    Wears dentures    top   Past Surgical History  Past Surgical History:  Procedure Laterality Date   ANTERIOR LAT LUMBAR FUSION N/A 01/18/2014   Procedure: ANTERIOR LATERAL LUMBAR FUSION LUMBAR TWO-THREE;  Surgeon: Eustace Moore, MD;  Location: [place] [facility];  Service: Neurosurgery;  Laterality: N/A;   ANTERIOR LUMBAR FUSION  01/18/2014   AV FISTULA PLACEMENT Left 04/20/2019   Procedure: ARTERIOVENOUS (AV) FISTULA CREATION;  Surgeon: [name], MD;  Location: [place];  Service: Vascular;  Laterality: Left;   BACK SURGERY     BREAST LUMPECTOMY Left 01/2003   BREAST LUMPECTOMY Right 2014   BREAST LUMPECTOMY WITH NEEDLE LOCALIZATION AND AXILLARY SENTINEL LYMPH NODE BX Right 10/13/2012   Procedure: BREAST LUMPECTOMY WITH NEEDLE LOCALIZATION;  Surgeon: Luella Cook  III, MD;  Location: [place];  Service: General;  Laterality: Right;  Right breast wire localized lumpectomy   INSERTION OF DIALYSIS CATHETER Right 04/20/2019   Procedure: INSERTION OF DIALYSIS CATHETER, right internal jugular;  Surgeon: [name], MD;  Location: [place];  Service: Vascular;  Laterality: Right;   INSERTION OF DIALYSIS CATHETER Right 09/24/2020   Procedure: INSERTION OF TUNNELED DIALYSIS CATHETER;  Surgeon: Rosetta Posner, MD;  Location: [place];  Service: Vascular;  Laterality: Right;    IR FLUORO GUIDE CV LINE RIGHT  09/22/2020   IR THORACENTESIS ASP PLEURAL SPACE W/IMG GUIDE  05/19/2019   IR US GUIDE VASC ACCESS LEFT  09/22/2020   IR US GUIDE VASC ACCESS RIGHT  09/22/2020   IR VENOCAVAGRAM SVC  09/22/2020   LAMINECTOMY  05/27/2009   Lumbar decompressive laminectomy, fusion and plating for lumbar spinal stensosis   LIGATION OF ARTERIOVENOUS  FISTULA Left 09/24/2020   Procedure: LIGATION OF LEFT ARM ARTERIOVENOUS  FISTULA;  Surgeon: Rosetta Posner, MD;  Location: [place];  Service: Vascular;  Laterality: Left;   LUMBAR LAMINECTOMY/DECOMPRESSION MICRODISCECTOMY Left 03/23/2013   Procedure: LUMBAR LAMINECTOMY/DECOMPRESSION MICRODISCECTOMY 1 LEVEL;  Surgeon: Eustace Moore, MD;  Location: [facility];  Service: Neurosurgery;  Laterality: Left;  LUMBAR LAMINECTOMY/DECOMPRESSION MICRODISCECTOMY 1 LEVEL   MASTECTOMY, PARTIAL Left 02/26/2003   ; S/P re-excision of cranial and lateral margins 04/19/2003.    RE-EXCISION OF BREAST CANCER,SUPERIOR MARGINS Right 10/27/2012   Procedure: RE-EXCISION OF BREAST CANCER,SUPERIOR and inferior MARGINS;  Surgeon: [name], MD;  Location: [place];  Service: General;  Laterality: Right;   RE-EXCISION OF BREAST LUMPECTOMY Left 04/2003   TEE WITHOUT CARDIOVERSION N/A 04/04/2019   Procedure: Transesophageal Echocardiogram (Tee);  Surgeon: Wonda Olds, MD;  Location: [place];  Service: Open Heart Surgery;  Laterality: N/A;   THORACIC AORTIC ANEURYSM REPAIR N/A 04/04/2019   Procedure: THORACIC ASCENDING ANEURYSM REPAIR (AAA)  USING 28 MM X 30 CM HEMASHIELD PLATINUM VASCULAR GRAFT;  Surgeon: Wonda Olds, MD;  Location: [facility];  Service: Open Heart Surgery;  Laterality: N/A;   Family History  Family History  Problem Relation Age of Onset   Colon cancer Mother 27   Hypertension Mother    Diabetes Sister 64   Hypertension Sister    Diabetes Brother    Hypertension Brother    Diabetes Brother    Hypertension Brother    Kidney disease Son         s/p renal transplant   Hypertension Son    Diabetes Son    Multiple sclerosis Son    Bone cancer Sister 57   Breast cancer Neg Hx    Cervical cancer Neg Hx    Social History  reports that she quit smoking about 19 months ago. Her smoking use included cigarettes. She has a 11.00 pack-year smoking history. She has never used smokeless tobacco. She reports previous alcohol use of about 2.0 standard drinks of alcohol per week. She reports previous drug use. Drug: Cocaine. Allergies  Allergies  Allergen Reactions   Shrimp [Shellfish Allergy] Shortness Of Breath   Bactroban [Mupirocin] Other (See Comments)    "Sores in nose"   Vicodin [Hydrocodone-Acetaminophen] Itching and Nausea And Vomiting    This is patient's home medication   Eliquis [Apixaban] Rash   Lisinopril Cough   Tylenol [Acetaminophen] Itching   Home medications Prior to Admission medications   Medication Sig Start Date End Date Taking? Authorizing Provider  rosuvastatin (CRESTOR) 10 MG tablet TAKE 1 TABLET (10 MG TOTAL) BY MOUTH DAILY. 07/03/20 12/23/20 Yes Patwardhan, Manish J, MD  albuterol (VENTOLIN HFA) 108 (90 Base) MCG/ACT inhaler Inhale 2 puffs into the lungs every 6 (six) hours as needed for wheezing or shortness of breath. 07/30/20   Maximiano Coss, NP  amLODipine (NORVASC) 10 MG tablet TAKE 1 TABLET (10 MG TOTAL) BY MOUTH DAILY. 09/20/18   Daleen Squibb, MD  baclofen (LIORESAL) 10 MG tablet Take 10 mg by mouth 2 (two) times daily as needed for muscle spasms. 07/11/20   [provider]  buprenorphine (BUTRANS) 10 MCG/HR [place] 1 patch onto the skin once a week. 08/12/20   [provider]  cloNIDine (CATAPRES - DOSED IN MG/24 HR) 0.3 mg/24hr patch Place 0.3 mg onto the skin once a week. 12/21/20   [provider]  COZAAR 100 MG tablet Take 100 mg by mouth daily. 12/21/20   [provider]  cyclobenzaprine (FLEXERIL) 5 MG tablet Take 5 mg by mouth 3 (three) times daily as needed  for muscle spasms. 09/18/20   [provider]  furosemide (LASIX) 20 MG tablet Take 20 mg by mouth every other day. 10/31/20   [provider]  hydrALAZINE (APRESOLINE) 25 MG tablet Take 1 tablet (25 mg total) by mouth 2 (two) times daily. 01/05/20   Jacelyn Pi, Lilia Argue, MD  loperamide (IMODIUM) 2 MG capsule Take 2 mg by mouth 2 (two) times daily as needed for diarrhea or loose stools. 07/15/20   [provider]  metoprolol tartrate (LOPRESSOR) 50 MG tablet Take 50 mg by mouth 2 (two) times daily. 08/26/20   [provider]  mirtazapine (REMERON) 7.5 MG tablet Take 1 tablet (7.5 mg total) by mouth at bedtime. 07/15/20   Maximiano Coss, NP  [facility] 4 MG/0.1ML LIQD nasal spray kit Place 1 spray into the nose as needed for opioid reversal. If no response within 3 minutes then repeat once in the other nostril.  Call 911. 05/15/20   [provider]  ondansetron (ZOFRAN) 4 MG tablet Take 1 tablet (4 mg total) by mouth every 8 (eight) hours as needed for nausea or vomiting. 08/03/19   Jacelyn Pi, Lilia Argue, MD  oxyCODONE-acetaminophen (PERCOCET) 10-325 MG tablet Take 1 tablet by mouth 3 (three) times daily as needed for severe pain. 09/18/20   [provider]  warfarin (COUMADIN) 6 MG tablet TAKE 1 TABLET (6 MG TOTAL) BY MOUTH 4 (FOUR) TIMES A WEEK. 07/04/20   Maximiano Coss, NP  warfarin (COUMADIN) 7.5 MG tablet Take 1 tablet (7.5 mg total) by mouth 3 (three) times a week. 05/15/20   Maximiano Coss, NP     Vitals:   12/23/20 0830 12/23/20 0900 12/23/20 0922 12/23/20 1000  BP: (!) 179/121 (!) 158/118 (!) 170/115 (!) 176/113  Pulse: 100 (!) 104 (!) 102 92  Resp: (!) 21 (!) 21 20 (!) 21  Temp:      TempSrc:      SpO2:      Weight:      Height:       Exam Gen alert, no distress, nasal O2 No rash, cyanosis or gangrene Sclera anicteric, throat clear  No jvd or bruits Chest clear bilat to bases, no rales/ wheezing RRR no RG, sternotomy scar Abd soft  ntnd no mass or ascites +bs GU normal MS no joint effusions or deformity Ext no LE or UE edema, no wounds or ulcers Neuro is alert, Ox  3 , nf  R IJ TDC   AF TTS 3h 45 min 43.5kg 2K/2Ca UF 2 TDC No heparin. Left 3kg over dry onSaturday      Home meds include norvasc, crestor, baclofen, clonidine, cozaar, lasix, hyrdalazine, lopressor , remeron, percocet , coumadin   OP HD: AF TTS   3h 51mn  43.5kg  2/2 bath  P2   TDC  Hep none  - left 3kg over dry on Sat   - hect 4 ug tiw   - mircera 225 on 7/12, due 7/26     CXR - diffuse IS edema, a bit worse than prior   Assessment/ Plan: SOB/ uncont HTN/ pulm edema - by CXR.  Bipap in ED and IV ntg. Is on HD now and tolerating UF to 2L then cramped. Had to dc IV ntg due to severe HA. Would suggest resume home BP meds asap this am to keep BP's coming down. Will d/w pmd. Not sure this is due to severe HTN or vol overload, no edema on exam.  ESRD - is on HD now. Usual is TTS. Not tolerating UF much, close to dry wt, will hold on HD tomorrow and see how she does w/ BP control.  HTN uncont - apparently was out of meds at home, would resume asap, have d/w pmd.  Atrial fib COPD Anemia ckd - Hb 11, follow MBD ckd - Ca 9, follow      RKelly Splinter MD 12/23/2020, 10:28 AM  Recent Labs  Lab 12/23/20 0403  WBC 7.0  HGB 11.2*   Recent Labs  Lab 12/23/20 0403  K 4.1  BUN 27*  CREATININE 6.92*  CALCIUM 9.7

## 2020-12-23 NOTE — ED Provider Notes (Signed)
[facility] EMERGENCY DEPARTMENT Provider Note   CSN: [id number] Arrival date & time: 12/23/20  0354     History Chief Complaint  Patient presents with   Respiratory Distress    Monique Henderson is a 70 y.o. female.  The history is provided by the patient and the EMS personnel.  Shortness of Breath Severity:  Moderate Onset quality:  Gradual Timing:  Constant Progression:  Worsening Chronicity:  New Relieved by:  Nothing Worsened by:  Nothing Associated symptoms: chest pain and cough   Associated symptoms: no fever and no hemoptysis      Patient with history of ESRD on dialysis, paroxysmal atrial fibrillation, previous history of aortic dissection with repair presents with shortness of breath.  Patient reports several hours ago she began feeling short of breath she also reports increasing cough without hemoptysis.  She reports now with coughing, she is having dull chest pressure on the right side.  There is no ripping or tearing sensation to her back.  She reports this feels similar to prior episodes of shortness of breath She does not miss any recent dialysis sessions, but has not been able to take all of her blood pressure medications Past Medical History:  Diagnosis Date   AF (paroxysmal atrial fibrillation) ([place]) 05/29/2019   Aortic atherosclerosis ([place]) 07/05/2019   Aortic dissection ([place]) 04/04/2019   Atelectasis 2002   Bilateral   Atrial fibrillation with RVR ([place]) 05/18/2019   Atypical chest pain 07/05/2019   Bone spur 2008   Right calcaneal foot spur   Breast cancer ([place]) 2004   Ductal carcinoma in situ of the left breast; S/P left partial mastectomy 02/26/2003; S/P re-excision of cranial and lateral margins11/18/2004.radiation   Breast cancer ([place]) 09/21/2012   right breast/ last radiation treatment 03/22/2013   Cerebral thrombosis with cerebral infarction 05/22/2019   Chronic kidney disease, stage IV (severe) ([place]) 10/10/2007   Chronic low back  pain 06/22/2016   Chronic obstructive lung disease ([place]) 01/16/2017   CKD (chronic kidney disease), stage IV ([place]) 07/19/2017   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 Autumn Messing; S/P re-excision of superior and inferior margins 10/27/2012.    Dissection of aorta ([place]) 04/03/2019   Elevated troponin 08/28/2019   ESRD on dialysis ([place]) 05/29/2019   Essential hypertension 09/16/2006   GERD 09/16/2006   GERD (gastroesophageal reflux disease)    H/O: stroke 05/29/2019   Hepatitis C    treated and RNA confirmed not detectable 01/2017   Hot flashes    Hx of radiation therapy 2005   left breast   Hx of radiation therapy 01/11/13- 03/22/13   right breast 5760 cGy 30 sessions   Hyperkalemia 08/28/2019   Hypertension    Insomnia 03/14/2015   Low back pain    Lumbar spinal stenosis    S/P lumbar decompressive laminectomy, fusion and plating for lumbar spinal stensosis   Malnutrition of moderate degree 05/19/2019   Non compliance with medical treatment 12/04/2017   Normocytic anemia    With thrombocytosis   On Coumadin for atrial fibrillation ([place]) 10/03/2019   Osteoarthritis    Paroxysmal SVT (supraventricular tachycardia) ([place]) 08/27/2019   Personal history of radiation therapy    Pleural effusion 05/18/2019   Positive D dimer 08/28/2019   Right ureteral stone 2002   S/P aortic aneurysm repair 04/07/2019   S/P lumbar spinal fusion 01/18/2014   S/P lumbar decompressive laminectomy, fusion, and plating for lumbar spinal  stenosis on 05/27/2009 by Dr. Eustace Moore.  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 Eustace Moore.     Shortness of breath    from pain   SIRS (systemic inflammatory response syndrome) ([facility]) 08/27/2019   SVT (supraventricular  tachycardia) ([place]) 08/28/2019   Tobacco use disorder 04/19/2009   Uterine fibroid    Wears dentures    top    Patient Active Problem List   Diagnosis Date Noted   Acute respiratory failure with hypoxia ([employer]) 09/21/2020   Acute renal failure superimposed on chronic kidney disease ([place]) 09/21/2020   Community acquired pneumonia 09/21/2020   Uremia 02/77/4128   Metabolic acidosis 78/67/6720   Hypertensive urgency 09/21/2020   On Coumadin for atrial fibrillation ([place]) 10/03/2019   Troponin level elevated 08/28/2019   Positive D dimer 08/28/2019   Hyperkalemia 08/28/2019   SVT (supraventricular tachycardia) ([place]) 08/28/2019   SIRS (systemic inflammatory response syndrome) ([place]) 08/27/2019   Paroxysmal SVT (supraventricular tachycardia) ([place]) 08/27/2019   Aortic atherosclerosis ([place]) 07/05/2019   Chest pain 07/05/2019   History of CVA (cerebrovascular accident) 05/29/2019   AF (paroxysmal atrial fibrillation) ([place]) 05/29/2019   ESRD on dialysis ([place]) 05/29/2019   Cerebral thrombosis with cerebral infarction 05/22/2019   Malnutrition of moderate degree 05/19/2019   Pleural effusion 05/18/2019   Atrial fibrillation with RVR ([place]) 05/18/2019   S/P aortic aneurysm repair 04/07/2019   Aortic dissection ([place]) 04/04/2019   Dissection of aorta ([place]) 04/03/2019   Non compliance with medical treatment 12/04/2017   CKD (chronic kidney disease), stage IV ([place]) 07/19/2017   Chronic obstructive lung disease ([place]) 01/16/2017   Chronic low back pain 06/22/2016   Insomnia 03/14/2015   S/P lumbar spinal fusion 01/18/2014   Tobacco use disorder 04/19/2009   Essential hypertension 09/16/2006   GERD 09/16/2006    Past Surgical History:  Procedure Laterality Date   ANTERIOR LAT LUMBAR FUSION N/A 01/18/2014   Procedure: ANTERIOR LATERAL LUMBAR FUSION LUMBAR TWO-THREE;  Surgeon: Eustace Moore, MD;  Location: [facility];  Service: Neurosurgery;  Laterality: N/A;   ANTERIOR LUMBAR FUSION  01/18/2014    AV FISTULA PLACEMENT Left 04/20/2019   Procedure: ARTERIOVENOUS (AV) FISTULA CREATION;  Surgeon: [name], MD;  Location: [facility];  Service: Vascular;  Laterality: Left;   BACK SURGERY     BREAST LUMPECTOMY Left 01/2003   BREAST LUMPECTOMY Right 2014   BREAST LUMPECTOMY WITH NEEDLE LOCALIZATION AND AXILLARY SENTINEL LYMPH NODE BX Right 10/13/2012   Procedure: BREAST LUMPECTOMY WITH NEEDLE LOCALIZATION;  Surgeon: [name], MD;  Location: [facility];  Service: General;  Laterality: Right;  Right breast wire localized lumpectomy   INSERTION OF DIALYSIS CATHETER Right 04/20/2019   Procedure: INSERTION OF DIALYSIS CATHETER, right internal jugular;  Surgeon: [name], MD;  Location: [facility];  Service: Vascular;  Laterality: Right;   INSERTION OF DIALYSIS CATHETER Right 09/24/2020   Procedure: INSERTION OF TUNNELED DIALYSIS CATHETER;  Surgeon: Rosetta Posner, MD;  Location: [facility];  Service: Vascular;  Laterality: Right;   IR FLUORO GUIDE CV LINE RIGHT  09/22/2020   IR THORACENTESIS ASP PLEURAL SPACE W/IMG GUIDE  05/19/2019   IR US GUIDE VASC ACCESS LEFT  09/22/2020   IR US GUIDE VASC ACCESS RIGHT  09/22/2020   IR VENOCAVAGRAM SVC  09/22/2020  LAMINECTOMY  05/27/2009   Lumbar decompressive laminectomy, fusion and plating for lumbar spinal stensosis   LIGATION OF ARTERIOVENOUS  FISTULA Left 09/24/2020   Procedure: LIGATION OF LEFT ARM ARTERIOVENOUS  FISTULA;  Surgeon: Rosetta Posner, MD;  Location: [facility];  Service: Vascular;  Laterality: Left;   LUMBAR LAMINECTOMY/DECOMPRESSION MICRODISCECTOMY Left 03/23/2013   Procedure: LUMBAR LAMINECTOMY/DECOMPRESSION MICRODISCECTOMY 1 LEVEL;  Surgeon: Eustace Moore, MD;  Location: [facility];  Service: Neurosurgery;  Laterality: Left;  LUMBAR LAMINECTOMY/DECOMPRESSION MICRODISCECTOMY 1 LEVEL   MASTECTOMY, PARTIAL Left 02/26/2003   ; S/P re-excision of cranial and lateral margins 04/19/2003.    RE-EXCISION OF BREAST  CANCER,SUPERIOR MARGINS Right 10/27/2012   Procedure: RE-EXCISION OF BREAST CANCER,SUPERIOR and inferior MARGINS;  Surgeon: [name], MD;  Location: [facility];  Service: General;  Laterality: Right;   RE-EXCISION OF BREAST LUMPECTOMY Left 04/2003   TEE WITHOUT CARDIOVERSION N/A 04/04/2019   Procedure: Transesophageal Echocardiogram ([place]);  Surgeon: Wonda Olds, MD;  Location: [facility];  Service: Open Heart Surgery;  Laterality: N/A;   THORACIC AORTIC ANEURYSM REPAIR N/A 04/04/2019   Procedure: THORACIC ASCENDING ANEURYSM REPAIR (AAA)  USING 28 MM X 30 CM HEMASHIELD PLATINUM VASCULAR GRAFT;  Surgeon: Wonda Olds, MD;  [facility];  Service: Open Heart Surgery;  Laterality: N/A;     OB History     Gravida  2   Para  2   Term  1   Preterm  1   AB      Living         SAB      IAB      Ectopic      Multiple      Live Births              Family History  Problem Relation Age of Onset   Colon cancer Mother 36   Hypertension Mother    Diabetes Sister 104   Hypertension Sister    Diabetes Brother    Hypertension Brother    Diabetes Brother    Hypertension Brother    Kidney disease Son        s/p renal transplant   Hypertension Son    Diabetes Son    Multiple sclerosis Son    Bone cancer Sister 38   Breast cancer Neg Hx    Cervical cancer Neg Hx     Social History   Tobacco Use   Smoking status: Former    Packs/day: 0.25    Years: 44.00    Pack years: 11.00    Types: Cigarettes    Quit date: 05/24/2019    Years since quitting: 1.5   Smokeless tobacco: Never   Tobacco comments:    smoking less  Vaping Use   Vaping Use: Never used  Substance Use Topics   Alcohol use: Not Currently    Alcohol/week: 2.0 standard drinks    Types: 2 Cans of beer per week    Comment:  "couple beers a weekend"   Drug use: Not Currently    Types: Cocaine    Comment: "stopped back in the 1980's"    Home Medications Prior to Admission medications    Medication Sig Start Date End Date Taking? Authorizing Provider  albuterol (VENTOLIN HFA) 108 (90 Base) MCG/ACT inhaler Inhale 2 puffs into the lungs every 6 (six) hours as needed for wheezing or shortness of breath. 07/30/20   Maximiano Coss, NP  amLODipine (NORVASC) 10 MG tablet TAKE 1  TABLET (10 MG TOTAL) BY MOUTH DAILY. 09/20/18   Daleen Squibb, MD  baclofen (LIORESAL) 10 MG tablet Take 10 mg by mouth 2 (two) times daily as needed for muscle spasms. 07/11/20   [provider]  buprenorphine (BUTRANS) 10 MCG/HR [place] 1 patch onto the skin once a week. 08/12/20   [provider]  cloNIDine (CATAPRES) 0.1 MG tablet Take 0.1 mg by mouth 2 (two) times daily as needed. 07/15/20   [provider]  cyclobenzaprine (FLEXERIL) 5 MG tablet Take 5 mg by mouth 3 (three) times daily as needed for muscle spasms. 09/18/20   [provider]  hydrALAZINE (APRESOLINE) 25 MG tablet Take 1 tablet (25 mg total) by mouth 2 (two) times daily. 01/05/20   Jacelyn Pi, Lilia Argue, MD  loperamide (IMODIUM) 2 MG capsule Take 2 mg by mouth 2 (two) times daily as needed for diarrhea or loose stools. 07/15/20   [provider]  metoprolol tartrate (LOPRESSOR) 50 MG tablet Take 50 mg by mouth 2 (two) times daily. 08/26/20   [provider]  mirtazapine (REMERON) 7.5 MG tablet Take 1 tablet (7.5 mg total) by mouth at bedtime. 07/15/20   Maximiano Coss, NP  [facility] 4 MG/0.1ML LIQD nasal spray kit Place 1 spray into the nose as needed for opioid reversal. If no response within 3 minutes then repeat once in the other nostril.  Call 911. 05/15/20   [provider]  ondansetron (ZOFRAN) 4 MG tablet Take 1 tablet (4 mg total) by mouth every 8 (eight) hours as needed for nausea or vomiting. 08/03/19   Jacelyn Pi, Lilia Argue, MD  oxyCODONE-acetaminophen (PERCOCET) 10-325 MG tablet Take 1 tablet by mouth 3 (three) times daily as needed for severe pain. 09/18/20   [provider]  rosuvastatin (CRESTOR) 10 MG tablet TAKE 1 TABLET (10 MG TOTAL) BY MOUTH DAILY. 07/03/20 10/01/20  Patwardhan, Reynold Bowen, MD  warfarin (COUMADIN) 6 MG tablet TAKE 1 TABLET (6 MG TOTAL) BY MOUTH 4 (FOUR) TIMES A WEEK. 07/04/20   Maximiano Coss, NP  warfarin (COUMADIN) 7.5 MG tablet Take 1 tablet (7.5 mg total) by mouth 3 (three) times a week. 05/15/20   Maximiano Coss, NP    Allergies    Shrimp [shellfish allergy], Bactroban [mupirocin], Vicodin [hydrocodone-acetaminophen], Eliquis [apixaban], Lisinopril, and Tylenol [acetaminophen]  Review of Systems   Review of Systems  Constitutional:  Negative for fever.  Respiratory:  Positive for cough and shortness of breath. Negative for hemoptysis.   Cardiovascular:  Positive for chest pain.  All other systems reviewed and are negative.  Physical Exam Updated Vital Signs BP (!) 225/138 (BP Location: Right Arm)   Pulse (!) 109   Temp 97.7 F (36.5 C) (Oral)   Resp (!) 26   Ht 1.6 m (_0 )   Wt 44.5 kg   SpO2 99%   BMI 17.36 kg/m   Physical Exam CONSTITUTIONAL: Chronically ill-appearing, mild distress noted HEAD: Normocephalic/atraumatic EYES: EOMI ENMT: Mucous membranes moist NECK: supple no meningeal signs SPINE/BACK:entire spine nontender CV: S1/S2 noted, tachycardic, no loud murmurs LUNGS: Tachypnea, but overall lung sounds are clear ABDOMEN: soft, nontender, no rebound or guarding, bowel sounds noted throughout abdomen GU:no cva tenderness NEURO: Pt is awake/alert/appropriate, moves all extremitiesx4.  No facial droop.   EXTREMITIES: pulses normal/equal, full ROM, pulses equal x4 in all extremities, no lower extremity edema SKIN: warm, midline sternotomy scar noted PSYCH: Anxious  ED Results / Procedures / Treatments   Labs (all  labs ordered are listed, but only abnormal results are displayed) Labs Reviewed  BASIC METABOLIC PANEL - Abnormal; Notable for the following components:      Result Value   Glucose,  Bld 103 (*)    BUN 27 (*)    Creatinine, Ser 6.92 (*)    GFR, Estimated 6 (*)    All other components within normal limits  CBC WITH DIFFERENTIAL/PLATELET - Abnormal; Notable for the following components:   RBC 3.72 (*)    Hemoglobin 11.2 (*)    MCV 101.3 (*)    MCHC 29.7 (*)    RDW 18.0 (*)    All other components within normal limits  RESP PANEL BY RT-PCR (FLU A&B, COVID) ARPGX2  PROTIME-INR  BRAIN NATRIURETIC PEPTIDE    EKG EKG Interpretation  Date/Time:  Monday December 23 2020 04:03:53 EDT Ventricular Rate:  108 PR Interval:  161 QRS Duration: 78 QT Interval:  371 QTC Calculation: 498 R Axis:   61 Text Interpretation: Sinus tachycardia Biatrial enlargement Nonspecific T abnormalities, lateral leads Confirmed by Ripley Fraise [phone]) on 12/23/2020 4:07:51 AM  Radiology DG Chest Port 1 View  Result Date: 12/23/2020 CLINICAL DATA:  Chest pain EXAM: PORTABLE CHEST 1 VIEW COMPARISON:  11/29/2018 FINDINGS: Cardiopericardial enlargement. Dialysis catheter with tip at the right atrium. Diffuse interstitial thickening but similar to prior. No visible effusion or pneumothorax. IMPRESSION: Cardiomegaly and borderline interstitial edema. Electronically Signed   By: Monte Fantasia M.D.   On: 12/23/2020 04:28    Procedures .Critical Care  Date/Time: 12/23/2020 4:15 AM Performed by: Ripley Fraise, MD Authorized by: Ripley Fraise, MD   Critical care provider statement:    Critical care time (minutes):  75   Critical care start time:  12/23/2020 4:15 AM   Critical care end time:  12/23/2020 5:30 AM   Critical care time was exclusive of:  Separately billable procedures and treating other patients   Critical care was necessary to treat or prevent imminent or life-threatening deterioration of the following conditions:  Metabolic crisis and respiratory failure   Critical care was time spent personally by me on the following activities:  Discussions with consultants, development of  treatment plan with patient or surrogate, examination of patient, evaluation of patient's response to treatment, re-evaluation of patient's condition, pulse oximetry, ordering and review of radiographic studies, ordering and review of laboratory studies, ordering and performing treatments and interventions and review of old charts   I assumed direction of critical care for this patient from another provider in my specialty: no     Care discussed with: admitting provider     Medications Ordered in ED Medications  nitroGLYCERIN 50 mg in dextrose 5 % 250 mL (0.2 mg/mL) infusion (50 mcg/min Intravenous Rate/Dose Change 12/23/20 0607)    ED Course  I have reviewed the triage vital signs and the nursing notes.  Pertinent labs & imaging results that were available during my care of the patient were reviewed by me and considered in my medical decision making (see chart for details).    MDM Rules/Calculators/A&P                           4:15 AM Patient presents with cough, shortness of breath and dull right-sided chest pain.  Patient has mild tachypnea, but lung sounds are overall clear. She has significant elevation in her blood pressure.  Nitroglycerin drip has been ordered. Imaging and labs are pending at this time 6:17 AM Patient  became more anxious, more dyspneic and reported right-sided chest pain with coughing.  Her chest pain was reproducible.  Suspect she is having worsening pulmonary edema in the setting of acute hypertensive emergency. Patient was placed on noninvasive ventilation/BiPAP and she is now improving At this time, I have lower suspicion for ACS/PE/Dissection at this stime 6:46 AM Endorsed to Dr. Olevia Bowens for admission.  We will also place consult to nephrology Final Clinical Impression(s) / ED Diagnoses Final diagnoses:  Hypertensive emergency  Acute respiratory failure, unspecified whether with hypoxia or hypercapnia [facility])    Rx / DC Orders ED Discharge Orders      None        Ripley Fraise, MD 12/23/20 [phone]

## 2020-12-23 NOTE — ED Notes (Signed)
Pt taken to dialysis with nurse and tech transport

## 2020-12-23 NOTE — ED Notes (Signed)
Pt removed BiPap and refuses to wear it at this time. O2 is 99% on RA at this time

## 2020-12-23 NOTE — ED Notes (Signed)
RT placed pt on BIPAP V60 with the settings of 12/6 bur 12 40%. MD wants to hold off on ABG post placement at this time. RT will continue to monitor.

## 2020-12-24 ENCOUNTER — Other Ambulatory Visit (HOSPITAL_COMMUNITY): Payer: Self-pay

## 2020-12-24 ENCOUNTER — Encounter (HOSPITAL_COMMUNITY): Payer: Self-pay | Admitting: Internal Medicine

## 2020-12-24 ENCOUNTER — Other Ambulatory Visit: Payer: Self-pay

## 2020-12-24 DIAGNOSIS — I160 Hypertensive urgency: Secondary | ICD-10-CM | POA: Diagnosis not present

## 2020-12-24 DIAGNOSIS — J9600 Acute respiratory failure, unspecified whether with hypoxia or hypercapnia: Secondary | ICD-10-CM | POA: Diagnosis not present

## 2020-12-24 LAB — CBC
HCT: 34.8 % — ABNORMAL LOW (ref 36.0–46.0)
Hemoglobin: 10.7 g/dL — ABNORMAL LOW (ref 12.0–15.0)
MCH: 30.3 pg (ref 26.0–34.0)
MCHC: 30.7 g/dL (ref 30.0–36.0)
MCV: 98.6 fL (ref 80.0–100.0)
Platelets: 259 10*3/uL (ref 150–400)
RBC: 3.53 MIL/uL — ABNORMAL LOW (ref 3.87–5.11)
RDW: 17.6 % — ABNORMAL HIGH (ref 11.5–15.5)
WBC: 4.9 10*3/uL (ref 4.0–10.5)
nRBC: 0 % (ref 0.0–0.2)

## 2020-12-24 LAB — BASIC METABOLIC PANEL
Anion gap: 11 (ref 5–15)
BUN: 27 mg/dL — ABNORMAL HIGH (ref 8–23)
CO2: 26 mmol/L (ref 22–32)
Calcium: 8.8 mg/dL — ABNORMAL LOW (ref 8.9–10.3)
Chloride: 98 mmol/L (ref 98–111)
Creatinine, Ser: 5.37 mg/dL — ABNORMAL HIGH (ref 0.44–1.00)
GFR, Estimated: 8 mL/min — ABNORMAL LOW (ref >60)
Glucose, Bld: 109 mg/dL — ABNORMAL HIGH (ref 70–99)
Potassium: 4.5 mmol/L (ref 3.5–5.1)
Sodium: 135 mmol/L (ref 135–145)

## 2020-12-24 LAB — PROTIME-INR
INR: 1.1 (ref 0.8–1.2)
Prothrombin Time: 13.9 seconds (ref 11.4–15.2)

## 2020-12-24 MED ORDER — RENA-VITE PO TABS: 1.0000 | ORAL_TABLET | Freq: Every day | ORAL | Status: DC

## 2020-12-24 MED ORDER — HYDRALAZINE HCL 25 MG PO TABS
25.0000 mg | ORAL_TABLET | Freq: Three times a day (TID) | ORAL | Status: DC
  Administered 2020-12-24: 25 mg via ORAL
  Filled 2020-12-24: qty 1

## 2020-12-24 MED ORDER — METOPROLOL TARTRATE 25 MG PO TABS
75.0000 mg | ORAL_TABLET | Freq: Two times a day (BID) | ORAL | 0 refills | Status: DC
  Filled 2020-12-24: qty 180, 30d supply, fill #0

## 2020-12-24 MED ORDER — NEPRO/CARBSTEADY PO LIQD
237.0000 mL | Freq: Two times a day (BID) | ORAL | Status: DC
  Administered 2020-12-24: 237 mL via ORAL

## 2020-12-24 MED ORDER — METOPROLOL TARTRATE 50 MG PO TABS: 75.0000 mg | ORAL_TABLET | Freq: Two times a day (BID) | ORAL | Status: DC

## 2020-12-24 MED ORDER — LOSARTAN POTASSIUM 100 MG PO TABS
100.0000 mg | ORAL_TABLET | Freq: Every day | ORAL | 0 refills | Status: DC
  Filled 2020-12-24: qty 30, 30d supply, fill #0

## 2020-12-24 MED ORDER — AMLODIPINE BESYLATE 10 MG PO TABS
10.0000 mg | ORAL_TABLET | Freq: Every day | ORAL | 0 refills | Status: DC
  Filled 2020-12-24: qty 90, 90d supply, fill #0

## 2020-12-24 MED ORDER — CLONIDINE 0.3 MG/24HR TD PTWK
0.3000 mg | MEDICATED_PATCH | TRANSDERMAL | 0 refills | Status: DC
  Filled 2020-12-24: qty 4, 28d supply, fill #0

## 2020-12-24 MED ORDER — HYDRALAZINE HCL 25 MG PO TABS
25.0000 mg | ORAL_TABLET | Freq: Two times a day (BID) | ORAL | 0 refills | Status: DC
  Filled 2020-12-24: qty 60, 30d supply, fill #0

## 2020-12-24 MED ORDER — WARFARIN SODIUM 7.5 MG PO TABS: 7.5000 mg | ORAL_TABLET | ORAL | Status: DC

## 2020-12-24 MED ORDER — WARFARIN SODIUM 6 MG PO TABS: 6.0000 mg | ORAL_TABLET | ORAL | Status: DC

## 2020-12-24 NOTE — Progress Notes (Signed)
[facility] Progress Note   Subjective:   Patient seen and examined at bedside.  Reports breathign improved.  Denies dyspnea, orthopnea, CP, n/v/d and abdominal pain.  Plans to d/c home today.  Discussed fluid restrictions of 30oz qd.    Objective Vitals:   12/24/20 0745 12/24/20 0900 12/24/20 1201 12/24/20 1211  BP: (!) 188/150 (!) 161/89 (!) 175/118 (!) 175/100  Pulse: 96 88 81   Resp: (!) 29 (!) 22 20   Temp:  98.8 F (37.1 C) 98.4 F (36.9 C)   TempSrc:  Oral Oral   SpO2: 96% 97% 99%   Weight:  44.5 kg    Height:  5\' 3"  (1.6 m)     Physical Exam General:chronically ill appearing female in NAD Heart:RRR, no mrg Lungs:mostly CTAB, faint crackles on LLL, nml WOB on RA Abdomen:soft, NTND Extremities:no LE edema Dialysis Access: [facility]   Filed Weights   12/23/20 0400 12/24/20 0900  Weight: 44.5 kg 44.5 kg    Intake/Output Summary (Last 24 hours) at 12/24/2020 1405 Last data filed at 12/24/2020 1015 Gross per 24 hour  Intake 118 ml  Output --  Net 118 ml    Additional Objective Labs: Basic Metabolic Panel: Recent Labs  Lab 12/23/20 0403 12/24/20 0500  NA 139 135  K 4.1 4.5  CL 101 98  CO2 26 26  GLUCOSE 103* 109*  BUN 27* 27*  CREATININE 6.92* 5.37*  CALCIUM 9.7 8.8*    CBC: Recent Labs  Lab 12/23/20 0403 12/24/20 0500  WBC 7.0 4.9  NEUTROABS 5.3  --   HGB 11.2* 10.7*  HCT 37.7 34.8*  MCV 101.3* 98.6  PLT 281 259   Studies/Results: DG Chest Port 1 View  Result Date: 12/23/2020 CLINICAL DATA:  Chest pain EXAM: PORTABLE CHEST 1 VIEW COMPARISON:  11/29/2018 FINDINGS: Cardiopericardial enlargement. Dialysis catheter with tip at the right atrium. Diffuse interstitial thickening but similar to prior. No visible effusion or pneumothorax. IMPRESSION: Cardiomegaly and borderline interstitial edema. Electronically Signed   By: Monte Fantasia M.D.   On: 12/23/2020 04:28    Medications:   amLODipine  10 mg Oral Daily   Chlorhexidine  Gluconate Cloth  6 each Topical Q0600   cloNIDine  0.3 mg Transdermal Weekly   docusate sodium  100 mg Oral BID   feeding supplement (NEPRO CARB STEADY)  237 mL Oral BID BM   hydrALAZINE  25 mg Oral Q8H   losartan  100 mg Oral Daily   metoprolol tartrate  75 mg Oral BID   mirtazapine  7.5 mg Oral QHS   multivitamin  1 tablet Oral QHS   nicotine  14 mg Transdermal Daily   rosuvastatin  10 mg Oral Daily   sodium chloride flush  3 mL Intravenous Q12H   Warfarin - Pharmacist Dosing Inpatient   Does not apply q1600    Dialysis Orders: AF TTS   3h 48min  43.5kg  2/2 bath  P2   TDC  Hep none  - left 3kg over dry on Sat   - hect 4 ug tiw   - mircera 225 on 7/12, due 7/26  Assessment/Plan: SOB/ pulm edema - by CXR. Improved post HD.  Provided education about fluid restrictions.  ESRD - on HD TTS.  HD yesterday off schedule.  K 4.5. No acute indications for dialysis today. Can resume regular dialysis schedule on 7/28.  HTN uncont - Had been out of HTN medications.  BP remains elevated, home meds restarted amlodipine 10mg  qd,  clonidine patch 0.3mg  qd, hydralazine 25mg  TID, losartan 100mg  qd and lopressor 75mg  BID Atrial fib - on warfarin COPD Anemia ckd - Hb 10.7, ESA due today, will request OP center changes dosing to Thurday.  MBD ckd - Ca  in goal, continue VDRA.  Malnutrition - renal diet w/fluid restriction, protein supplements  Jen Mow, PA-C [place] 12/24/2020,2:05 PM  LOS: 0 days

## 2020-12-24 NOTE — ED Notes (Signed)
Breakfast Order Placed ?

## 2020-12-24 NOTE — Discharge Instructions (Signed)
Information on my medicine - Coumadin   (Warfarin)  Why was Coumadin prescribed for you? Coumadin was prescribed for you because you have a blood clot or a medical condition that can cause an increased risk of forming blood clots. Blood clots can cause serious health problems by blocking the flow of blood to the heart, lung, or brain. Coumadin can prevent harmful blood clots from forming. As a reminder your indication for Coumadin is:  stroke prevention due to atrial fibrillation  What test will check on my response to Coumadin? While on Coumadin (warfarin) you will need to have an INR test regularly to ensure that your dose is keeping you in the desired range. The INR (international normalized ratio) number is calculated from the result of the laboratory test called prothrombin time (PT).  If an INR APPOINTMENT HAS NOT ALREADY BEEN MADE FOR YOU please schedule an appointment to have this lab work done by your health care provider within 7 days. Your INR goal is usually a number between:  2 to 3 or your provider may give you a more narrow range like 2-2.5.  Ask your health care provider during an office visit what your goal INR is.  What  do you need to  know  About  COUMADIN? Take Coumadin (warfarin) exactly as prescribed by your healthcare provider about the same time each day.  DO NOT stop taking without talking to the doctor who prescribed the medication.  Stopping without other blood clot prevention medication to take the place of Coumadin may increase your risk of developing a new clot or stroke.  Get refills before you run out.  What do you do if you miss a dose? If you miss a dose, take it as soon as you remember on the same day then continue your regularly scheduled regimen the next day.  Do not take two doses of Coumadin at the same time.  Important Safety Information A possible side effect of Coumadin (Warfarin) is an increased risk of bleeding. You should call your healthcare provider  right away if you experience any of the following: Bleeding from an injury or your nose that does not stop. Unusual colored urine (red or dark brown) or unusual colored stools (red or black). Unusual bruising for unknown reasons. A serious fall or if you hit your head (even if there is no bleeding).  Some foods or medicines interact with Coumadin (warfarin) and might alter your response to warfarin. To help avoid this: Eat a balanced diet, maintaining a consistent amount of Vitamin K. Notify your provider about major diet changes you plan to make. Avoid alcohol or limit your intake to 1 drink for women and 2 drinks for men per day. (1 drink is 5 oz. wine, 12 oz. beer, or 1.5 oz. liquor.)  Make sure that ANY health care provider who prescribes medication for you knows that you are taking Coumadin (warfarin).  Also make sure the healthcare provider who is monitoring your Coumadin knows when you have started a new medication including herbals and non-prescription products.  Coumadin (Warfarin)  Major Drug Interactions  Increased Warfarin Effect Decreased Warfarin Effect  Alcohol (large quantities) Antibiotics (esp. Septra/Bactrim, Flagyl, Cipro) Amiodarone (Cordarone) Aspirin (ASA) Cimetidine (Tagamet) Megestrol (Megace) NSAIDs (ibuprofen, naproxen, etc.) Piroxicam (Feldene) Propafenone (Rythmol SR) Propranolol (Inderal) Isoniazid (INH) Posaconazole (Noxafil) Barbiturates (Phenobarbital) Carbamazepine (Tegretol) Chlordiazepoxide (Librium) Cholestyramine (Questran) Griseofulvin Oral Contraceptives Rifampin Sucralfate (Carafate) Vitamin K   Coumadin (Warfarin) Major Herbal Interactions  Increased Warfarin Effect Decreased Warfarin Effect    Garlic Ginseng Ginkgo biloba Coenzyme Q10 Green tea St. John's wort    Coumadin (Warfarin) FOOD Interactions  Eat a consistent number of servings per week of foods HIGH in Vitamin K (1 serving =  cup)  Collards (cooked, or boiled &  drained) Kale (cooked, or boiled & drained) Mustard greens (cooked, or boiled & drained) Parsley *serving size only =  cup Spinach (cooked, or boiled & drained) Swiss chard (cooked, or boiled & drained) Turnip greens (cooked, or boiled & drained)  Eat a consistent number of servings per week of foods MEDIUM-HIGH in Vitamin K (1 serving = 1 cup)  Asparagus (cooked, or boiled & drained) Broccoli (cooked, boiled & drained, or raw & chopped) Brussel sprouts (cooked, or boiled & drained) *serving size only =  cup Lettuce, raw (green leaf, endive, romaine) Spinach, raw Turnip greens, raw & chopped   These websites have more information on Coumadin (warfarin):  www.coumadin.com; www.ahrq.gov/consumer/coumadin.htm;   

## 2020-12-24 NOTE — Discharge Summary (Signed)
Physician Discharge Summary  Monique Henderson PJP:[id number] DOB: 01/15/1951 DOA: 12/23/2020  PCP: Maximiano Coss, NP  Admit date: 12/23/2020 Discharge date: 12/24/2020  Admitted From: home Discharge disposition: home   Recommendations for Outpatient Follow-Up:   Patient ran out of her BP meds, needs to follow up close with PCP Resume HD   Discharge Diagnosis:   Principal Problem:   Hypertensive urgency Active Problems:   Tobacco use disorder   Chronic low back pain   Chronic obstructive lung disease (HCC)   Malnutrition of moderate degree   ESRD on dialysis ([place])   On Coumadin for atrial fibrillation ([place])   Acute pulmonary edema ([place])    Discharge Condition: Improved.  Diet recommendation: renal diet  Wound care: None.  Code status: Full.   History of Present Illness:   Monique Henderson is a 70 y.o. female with medical history significant of afib; aortic dissection (2020); breast cancer (2004, 2014); CVA; ESRD on TTS HD; COPD; HTN; chronic pain; and tobacco dependence presenting with SOB.  She reports that she has not missed HD and last attended a full session on Saturday.  However, she did not take any medications on Sunday and also has not seen her PCP in a while and so ran out of her BP meds several days ago.  She began feeling SOB yesterday and it worsened overnight.  She currently has a headache and is mildly SOB.   Hospital Course by Problem:   SOB/ pulm edema - by CXR. Improved post HD and BP improvement  ESRD - on HD TTS Can resume regular dialysis schedule on 7/28.  HTN uncont - Had been out of HTN medications.  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  Nutrition Status: Nutrition Problem: Moderate Malnutrition Etiology: chronic illness (ESRD on HD) Signs/Symptoms: mild fat depletion, moderate fat  depletion, mild muscle depletion, moderate muscle depletion Interventions: Nepro shake, MVI      Medical Consultants:   renal   Discharge Exam:   Vitals:   12/24/20 1201 12/24/20 1211  BP: (!) 175/118 (!) 175/100  Pulse: 81   Resp: 20   Temp: 98.4 F (36.9 C)   SpO2: 99%    Vitals:   12/24/20 0745 12/24/20 0900 12/24/20 1201 12/24/20 1211  BP: (!) 188/150 (!) 161/89 (!) 175/118 (!) 175/100  Pulse: 96 88 81   Resp: (!) 29 (!) 22 20   Temp:  98.8 F (37.1 C) 98.4 F (36.9 C)   TempSrc:  Oral Oral   SpO2: 96% 97% 99%   Weight:  44.5 kg    Height:  _0  (1.6 m)      General exam: Appears calm and comfortable.   The results of significant diagnostics from this hospitalization (including imaging, microbiology, ancillary and laboratory) are listed below for reference.     Procedures and Diagnostic Studies:   DG Chest Port 1 View  Result Date: 12/23/2020 CLINICAL DATA:  Chest pain EXAM: PORTABLE CHEST 1 VIEW COMPARISON:  11/29/2018 FINDINGS: Cardiopericardial enlargement. Dialysis catheter with tip at the right atrium. Diffuse interstitial thickening but similar to prior. No visible effusion or pneumothorax. IMPRESSION: Cardiomegaly and borderline interstitial edema. Electronically Signed   By: Monte Fantasia M.D.   On: 12/23/2020 04:28     Labs:   Basic  Metabolic Panel: Recent Labs  Lab 12/23/20 0403 12/24/20 0500  NA 139 135  K 4.1 4.5  CL 101 98  CO2 26 26  GLUCOSE 103* 109*  BUN 27* 27*  CREATININE 6.92* 5.37*  CALCIUM 9.7 8.8*   GFR Estimated Creatinine Clearance: 7 mL/min (A) (by C-G formula based on SCr of 5.37 mg/dL (H)). Liver Function Tests: No results for input(s): AST, ALT, ALKPHOS, BILITOT, PROT, ALBUMIN in the last 168 hours. No results for input(s): LIPASE, AMYLASE in the last 168 hours. No results for input(s): AMMONIA in the last 168 hours. Coagulation profile Recent Labs  Lab 12/23/20 0403 12/24/20 0500  INR 0.9 1.1     CBC: Recent Labs  Lab 12/23/20 0403 12/24/20 0500  WBC 7.0 4.9  NEUTROABS 5.3  --   HGB 11.2* 10.7*  HCT 37.7 34.8*  MCV 101.3* 98.6  PLT 281 259   Cardiac Enzymes: No results for input(s): CKTOTAL, CKMB, CKMBINDEX, TROPONINI in the last 168 hours. BNP: Invalid input(s): POCBNP CBG: No results for input(s): GLUCAP in the last 168 hours. D-Dimer No results for input(s): DDIMER in the last 72 hours. Hgb A1c No results for input(s): HGBA1C in the last 72 hours. Lipid Profile No results for input(s): CHOL, HDL, LDLCALC, TRIG, CHOLHDL, LDLDIRECT in the last 72 hours. Thyroid function studies No results for input(s): TSH, T4TOTAL, T3FREE, THYROIDAB in the last 72 hours.  Invalid input(s): FREET3 Anemia work up No results for input(s): VITAMINB12, FOLATE, FERRITIN, TIBC, IRON, RETICCTPCT in the last 72 hours. Microbiology Recent Results (from the past 240 hour(s))  Resp Panel by RT-PCR (Flu A&B, Covid) Nasopharyngeal Swab     Status: None   Collection Time: 12/23/20  4:11 AM   Specimen: Nasopharyngeal Swab; Nasopharyngeal(NP) swabs in vial transport medium  Result Value Ref Range Status   SARS Coronavirus 2 by RT PCR NEGATIVE NEGATIVE Final    Comment: (NOTE) SARS-CoV-2 target nucleic acids are NOT DETECTED.  The SARS-CoV-2 RNA is generally detectable in upper respiratory specimens during the acute phase of infection. The lowest concentration of SARS-CoV-2 viral copies this assay can detect is 138 copies/mL. A negative result does not preclude SARS-Cov-2 infection and should not be used as the sole basis for treatment or other patient management decisions. A negative result may occur with  improper specimen collection/handling, submission of specimen other than nasopharyngeal swab, presence of viral mutation(s) within the areas targeted by this assay, and inadequate number of viral copies(<138 copies/mL). A negative result must be combined with clinical observations,  patient history, and epidemiological information. The expected result is Negative.  Fact Sheet for Patients:  EntrepreneurPulse.com.au  Fact Sheet for Healthcare Providers:  IncredibleEmployment.be  This test is no t yet approved or cleared by the [place] FDA and  has been authorized for detection and/or diagnosis of SARS-CoV-2 by FDA under an Emergency Use Authorization (EUA). This EUA will remain  in effect (meaning this test can be used) for the duration of the COVID-19 declaration under Section 564(b)(1) of the Act, 21 U.S.C.section 360bbb-3(b)(1), unless the authorization is terminated  or revoked sooner.       Influenza A by PCR NEGATIVE NEGATIVE Final   Influenza B by PCR NEGATIVE NEGATIVE Final    Comment: (NOTE) The Xpert Xpress SARS-CoV-2/FLU/RSV plus assay is intended as an aid in the diagnosis of influenza from Nasopharyngeal swab specimens and should not be used as a sole basis for treatment. Nasal washings and aspirates are unacceptable for Xpert Xpress SARS-CoV-2/FLU/RSV  testing.  Fact Sheet for Patients: EntrepreneurPulse.com.au  Fact Sheet for Healthcare Providers: IncredibleEmployment.be  This test is not yet approved or cleared by the [place] FDA and has been authorized for detection and/or diagnosis of SARS-CoV-2 by FDA under an Emergency Use Authorization (EUA). This EUA will remain in effect (meaning this test can be used) for the duration of the COVID-19 declaration under Section 564(b)(1) of the Act, 21 U.S.C. section 360bbb-3(b)(1), unless the authorization is terminated or revoked.  Performed at [facility], [place] [address] [place], [place] [address]      Discharge Instructions:   Discharge Instructions     Discharge instructions   Complete by: As directed    Renal diet   Increase activity slowly   Complete by: As directed    No wound care    Complete by: As directed       Allergies as of 12/24/2020       Reactions   Shrimp [shellfish Allergy] Shortness Of Breath   Bactroban [mupirocin] Other (See Comments)   "Sores in nose"   Vicodin [hydrocodone-acetaminophen] Itching, Nausea And Vomiting   This is patient's home medication   Eliquis [apixaban] Rash   Lisinopril Cough   Tylenol [acetaminophen] Itching        Medication List     TAKE these medications    acetaminophen 325 MG tablet Commonly known as: TYLENOL Take 650 mg by mouth every 6 (six) hours as needed for mild pain.   albuterol 108 (90 Base) MCG/ACT inhaler Commonly known as: VENTOLIN HFA Inhale 2 puffs into the lungs every 6 (six) hours as needed for wheezing or shortness of breath.   amLODipine 10 MG tablet Commonly known as: NORVASC Take 1 tablet (10 mg total) by mouth daily.   cloNIDine 0.3 mg/24hr patch Commonly known as: CATAPRES - Dosed in mg/24 hr Place 1 patch (0.3 mg total) onto the skin every Saturday. Start taking on: December 28, 2020   cyclobenzaprine 5 MG tablet Commonly known as: FLEXERIL Take 5 mg by mouth 2 (two) times daily as needed for muscle spasms.   furosemide 20 MG tablet Commonly known as: LASIX Take 20 mg by mouth every other day.   hydrALAZINE 25 MG tablet Commonly known as: APRESOLINE Take 1 tablet (25 mg total) by mouth 2 (two) times daily.   loperamide 2 MG capsule Commonly known as: IMODIUM Take 2 mg by mouth daily.   losartan 100 MG tablet Commonly known as: COZAAR Take 1 tablet (100 mg total) by mouth daily.   Metoprolol Tartrate 75 MG Tabs Take 75 mg by mouth 2 (two) times daily. What changed:  medication strength how much to take   Narcan 4 MG/0.1ML Liqd nasal spray kit Generic drug: naloxone Place 1 spray into the nose as needed for opioid reversal. If no response within 3 minutes then repeat once in the other nostril.  Call 911.   ondansetron 4 MG tablet Commonly known as: ZOFRAN Take 1  tablet (4 mg total) by mouth every 8 (eight) hours as needed for nausea or vomiting.   oxyCODONE-acetaminophen 10-325 MG tablet Commonly known as: PERCOCET Take 1 tablet by mouth 3 (three) times daily as needed for severe pain.   rosuvastatin 10 MG tablet Commonly known as: CRESTOR TAKE 1 TABLET (10 MG TOTAL) BY MOUTH DAILY.   VITAMIN D PO Take 1 tablet by mouth daily.   VITAMIN E PO Take 1 tablet by mouth daily.   warfarin 6 MG tablet  Commonly known as: COUMADIN Take 1 tablet (6 mg total) by mouth 4 (four) times a week. Alternates with 7.2m tablet   warfarin 7.5 MG tablet Commonly known as: Coumadin Take 1 tablet (7.5 mg total) by mouth 3 (three) times a week. Alternates with 654mtablet Start taking on: December 25, 2020       ASK your doctor about these medications    mirtazapine 7.5 MG tablet Commonly known as: REMERON Take 1 tablet (7.5 mg total) by mouth at bedtime.        Follow-up Information     MoMaximiano CossNP Follow up in 1 week(s).   Specialty: Adult Health Nurse Practitioner Why: for BP check Contact information: [phone]                Time coordinating discharge: 25 min  Signed:  JeGeradine GirtO  [facility] 12/24/2020, 1:33 PM

## 2020-12-24 NOTE — Progress Notes (Signed)
Initial Nutrition Assessment  DOCUMENTATION CODES:   Underweight, Non-severe (moderate) malnutrition in context of chronic illness  INTERVENTION:   -Renal MVI daily -Nepro Shake po BID, each supplement provides 425 kcal and 19 grams protein   NUTRITION DIAGNOSIS:   Moderate Malnutrition related to chronic illness (ESRD on HD) as evidenced by mild fat depletion, moderate fat depletion, mild muscle depletion, moderate muscle depletion.  GOAL:   Patient will meet greater than or equal to 90% of their needs  MONITOR:   PO intake, Supplement acceptance, Labs, Weight trends, Skin, I & O's  REASON FOR ASSESSMENT:   Consult Assessment of nutrition requirement/status  ASSESSMENT:   Monique Henderson is a 70 y.o. female with medical history significant of afib; aortic dissection (2020); breast cancer (2004, 2014); CVA; ESRD on TTS HD; COPD; HTN; chronic pain; and tobacco dependence presenting with SOB.  She reports that she has not missed HD and last attended a full session on Saturday.  However, she did not take any medications on Sunday and also has not seen her PCP in a while and so ran out of her BP meds several days ago.  She began feeling SOB yesterday and it worsened overnight.  She currently has a headache and is mildly SOB.  Pt admitted with hypertensive crisis with pulmonary edema.    Reviewed I/O's: -1.8 L x 24 hours   Spoke with pt at bedside, who was pleasant and in good spirits today. She shares that her appetite continues to improve. She reports she consumed 100% of her breakfast this morning. Pt reports she consumes 3 meals per day on both HD and non-HD days (Breakfast: eggs, grits, and bacon; Lunch: sandwich and applesauce; Dinner: meat, starch, and vegetable). Pt shares she drinks one cup of ice chips, a cup of coffee, and 6 ounces of juice daily. She also drinks one Nepro supplement daily, usually sometime after HD treatments.   Pt denies any weight loss or changes  with EDW. Per nephrology notes, EDW 43.5 kg. Per pt, her UBW is around 120#, but has been weighing between 106-11#. Reviewed wt hx; wt has been stable over the past 3 months.   Discussed importance of good meal and supplement intake to promote healing. Pt amenable to Nepro supplements.   Medications reviewed and include colace.   Labs reviewed.   NUTRITION - FOCUSED PHYSICAL EXAM:  Flowsheet Row Most Recent Value  Orbital Region No depletion  Upper Arm Region Mild depletion  Thoracic and Lumbar Region Mild depletion  Buccal Region No depletion  Temple Region No depletion  Clavicle Bone Region Mild depletion  Clavicle and Acromion Bone Region No depletion  Scapular Bone Region No depletion  Dorsal Hand Mild depletion  Patellar Region Mild depletion  Anterior Thigh Region Mild depletion  Posterior Calf Region Mild depletion  Edema (RD Assessment) None  Hair Reviewed  Eyes Reviewed  Mouth Reviewed  Skin Reviewed  Nails Reviewed       Diet Order:   Diet Order             Diet renal with fluid restriction Fluid restriction: 1200 mL Fluid; Room service appropriate? Yes; Fluid consistency: Thin  Diet effective now                   EDUCATION NEEDS:   Education needs have been addressed  Skin:  Skin Assessment: Reviewed RN Assessment  Last BM:  Unknown  Height:   Ht Readings from Last 1 Encounters:  12/24/20  5\' 3"  (1.6 m)    Weight:   Wt Readings from Last 1 Encounters:  12/24/20 44.5 kg    Ideal Body Weight:  52.3 kg  BMI:  Body mass index is 17.38 kg/m.  Estimated Nutritional Needs:   Kcal:  1400-1600  Protein:  75-90 grams  Fluid:  1000 ml + UOP    Loistine Chance, RD, LDN, [place] Registered Dietitian II Certified Diabetes Care and Education Specialist Please refer to [facility] for RD and/or RD on-call/weekend/after hours pager

## 2020-12-24 NOTE — Progress Notes (Signed)
Alcus Dad to be D/C'd Home per MD order.  Discussed with the patient and all questions fully answered.  VSS, Skin clean, dry and intact without evidence of skin break down, no evidence of skin tears noted. IV catheter discontinued intact. Site without signs and symptoms of complications. Dressing and pressure applied.  An After Visit Summary was printed and given to the patient. Patient received prescriptions from [place].  D/c education completed with patient/family including follow up instructions, medication list, d/c activities limitations if indicated, with other d/c instructions as indicated by MD - patient able to verbalize understanding, all questions fully answered.   Patient instructed to return to ED, call 911, or call MD for any changes in condition.   Patient escorted via [place], and D/C home via private auto.  Manuella Ghazi 12/24/2020 3:08 PM

## 2020-12-25 ENCOUNTER — Telehealth: Payer: Self-pay | Admitting: Nephrology

## 2020-12-25 NOTE — Telephone Encounter (Signed)
Transition of Care Contact from [place]  Date of Discharge: 12/24/20 Date of Contact: 12/25/20 Method of contact: phone - attempted  Attempted to contact patient to discuss transition of care from inpatient admission.  Patient answered the phone and hung up, did not answer the phone when attempted to call again.  Will follow up with patient at dialysis.   Jen Mow, PA-C [facility] Pager: [phone]

## 2020-12-26 ENCOUNTER — Other Ambulatory Visit (HOSPITAL_COMMUNITY): Payer: Self-pay

## 2021-01-06 ENCOUNTER — Ambulatory Visit: Payer: Medicare Other

## 2021-01-14 ENCOUNTER — Other Ambulatory Visit: Payer: Self-pay

## 2021-01-14 ENCOUNTER — Telehealth: Payer: Self-pay

## 2021-01-14 DIAGNOSIS — N186 End stage renal disease: Secondary | ICD-10-CM

## 2021-01-14 NOTE — Telephone Encounter (Signed)
Patient states she was informed by an provider who left [place] to transfer from [facility] Coss to [employer].    Patient is requesting transfer.  Please advise.

## 2021-01-15 NOTE — Telephone Encounter (Signed)
Should be ok to transfer, with follow up planned with current PCP on the 22nd.

## 2021-01-15 NOTE — Telephone Encounter (Signed)
Ok to transfer. I can see her on the 22nd still to give her time to transfer over  Thanks  Denice Paradise

## 2021-01-17 ENCOUNTER — Encounter (HOSPITAL_COMMUNITY): Payer: Medicare Other

## 2021-01-17 ENCOUNTER — Ambulatory Visit: Payer: Medicare Other | Admitting: Vascular Surgery

## 2021-01-17 ENCOUNTER — Other Ambulatory Visit (HOSPITAL_COMMUNITY): Payer: Medicare Other

## 2021-01-20 ENCOUNTER — Ambulatory Visit: Payer: Medicare Other | Admitting: Registered Nurse

## 2021-01-29 ENCOUNTER — Ambulatory Visit: Payer: Medicare Other | Admitting: Registered Nurse

## 2021-02-11 ENCOUNTER — Encounter (HOSPITAL_COMMUNITY): Payer: Self-pay | Admitting: Emergency Medicine

## 2021-02-11 ENCOUNTER — Emergency Department (HOSPITAL_COMMUNITY)
Admission: EM | Admit: 2021-02-11 | Discharge: 2021-02-11 | Disposition: A | Discharge status: Home / Self Care | Payer: Medicare Other | Attending: Emergency Medicine | Admitting: Emergency Medicine

## 2021-02-11 ENCOUNTER — Other Ambulatory Visit: Payer: Self-pay

## 2021-02-11 ENCOUNTER — Emergency Department (HOSPITAL_COMMUNITY): Payer: Medicare Other

## 2021-02-11 DIAGNOSIS — R0602 Shortness of breath: Secondary | ICD-10-CM | POA: Diagnosis present

## 2021-02-11 DIAGNOSIS — I480 Paroxysmal atrial fibrillation: Secondary | ICD-10-CM | POA: Diagnosis not present

## 2021-02-11 DIAGNOSIS — R778 Other specified abnormalities of plasma proteins: Secondary | ICD-10-CM | POA: Diagnosis not present

## 2021-02-11 DIAGNOSIS — D631 Anemia in chronic kidney disease: Secondary | ICD-10-CM | POA: Diagnosis not present

## 2021-02-11 DIAGNOSIS — Z992 Dependence on renal dialysis: Secondary | ICD-10-CM | POA: Insufficient documentation

## 2021-02-11 DIAGNOSIS — I120 Hypertensive chronic kidney disease with stage 5 chronic kidney disease or end stage renal disease: Secondary | ICD-10-CM | POA: Diagnosis not present

## 2021-02-11 DIAGNOSIS — J449 Chronic obstructive pulmonary disease, unspecified: Secondary | ICD-10-CM | POA: Insufficient documentation

## 2021-02-11 DIAGNOSIS — Z79899 Other long term (current) drug therapy: Secondary | ICD-10-CM | POA: Diagnosis not present

## 2021-02-11 DIAGNOSIS — Z853 Personal history of malignant neoplasm of breast: Secondary | ICD-10-CM | POA: Insufficient documentation

## 2021-02-11 DIAGNOSIS — Z87891 Personal history of nicotine dependence: Secondary | ICD-10-CM | POA: Insufficient documentation

## 2021-02-11 DIAGNOSIS — I10 Essential (primary) hypertension: Secondary | ICD-10-CM

## 2021-02-11 DIAGNOSIS — N186 End stage renal disease: Secondary | ICD-10-CM | POA: Diagnosis not present

## 2021-02-11 DIAGNOSIS — Z7901 Long term (current) use of anticoagulants: Secondary | ICD-10-CM | POA: Diagnosis not present

## 2021-02-11 LAB — CBC
HCT: 35.9 % — ABNORMAL LOW (ref 36.0–46.0)
Hemoglobin: 10.7 g/dL — ABNORMAL LOW (ref 12.0–15.0)
MCH: 28.2 pg (ref 26.0–34.0)
MCHC: 29.8 g/dL — ABNORMAL LOW (ref 30.0–36.0)
MCV: 94.5 fL (ref 80.0–100.0)
Platelets: 216 10*3/uL (ref 150–400)
RBC: 3.8 MIL/uL — ABNORMAL LOW (ref 3.87–5.11)
RDW: 17.2 % — ABNORMAL HIGH (ref 11.5–15.5)
WBC: 6.7 10*3/uL (ref 4.0–10.5)
nRBC: 0 % (ref 0.0–0.2)

## 2021-02-11 LAB — BASIC METABOLIC PANEL
Anion gap: 16 — ABNORMAL HIGH (ref 5–15)
BUN: 46 mg/dL — ABNORMAL HIGH (ref 8–23)
CO2: 24 mmol/L (ref 22–32)
Calcium: 10.1 mg/dL (ref 8.9–10.3)
Chloride: 98 mmol/L (ref 98–111)
Creatinine, Ser: 9.89 mg/dL — ABNORMAL HIGH (ref 0.44–1.00)
GFR, Estimated: 4 mL/min — ABNORMAL LOW (ref >60)
Glucose, Bld: 134 mg/dL — ABNORMAL HIGH (ref 70–99)
Potassium: 4.3 mmol/L (ref 3.5–5.1)
Sodium: 138 mmol/L (ref 135–145)

## 2021-02-11 LAB — TROPONIN I (HIGH SENSITIVITY)
Troponin I (High Sensitivity): 245 ng/L (ref <18)
Troponin I (High Sensitivity): 302 ng/L (ref <18)
Troponin I (High Sensitivity): 305 ng/L (ref <18)

## 2021-02-11 MED ORDER — METOPROLOL TARTRATE 25 MG PO TABS
75.0000 mg | ORAL_TABLET | Freq: Once | ORAL | Status: AC
  Administered 2021-02-11: 75 mg via ORAL
  Filled 2021-02-11: qty 3

## 2021-02-11 MED ORDER — OXYCODONE-ACETAMINOPHEN 5-325 MG PO TABS
1.0000 | ORAL_TABLET | Freq: Once | ORAL | Status: AC
Start: 2021-02-11 — End: 2021-02-11
  Administered 2021-02-11: 1 via ORAL
  Filled 2021-02-11: qty 1

## 2021-02-11 MED ORDER — AMLODIPINE BESYLATE 5 MG PO TABS
10.0000 mg | ORAL_TABLET | Freq: Once | ORAL | Status: AC
  Administered 2021-02-11: 10 mg via ORAL
  Filled 2021-02-11: qty 2

## 2021-02-11 NOTE — Discharge Instructions (Addendum)
Seen today for shortness of breath and high blood pressure.  It is very important that you proceed to dialysis today as scheduled.  Make sure that you are taking your blood pressure medications daily.  Follow-up with your primary physician.

## 2021-02-11 NOTE — ED Provider Notes (Signed)
Emergency Medicine Provider Triage Evaluation Note  Monique Henderson , a 70 y.o. female  was evaluated in triage.  Pt complains of chest pain and SOB. Pain is substernal and worse with coughing and deep breathing. Feels she may be retaining fluid from eating too much ice at home. Has been compliant with her T/Th/S dialysis. Noncompliant with her antihypertensives over the last few days. Denies fever.  Review of Systems  Positive: Chest pain, SOB Negative: Fever  Physical Exam  There were no vitals taken for this visit. Gen:   Awake, no distress   Resp:  Normal effort  MSK:   Moves extremities without difficulty  Other:  Thin, frail appearing. Decreased breath sounds b/l bases.  Medical Decision Making  Medically screening exam initiated at 1:02 AM.  Appropriate orders placed.  Monique Henderson was informed that the remainder of the evaluation will be completed by another provider, this initial triage assessment does not replace that evaluation, and the importance of remaining in the ED until their evaluation is complete.  Chest pain   Antonietta Breach, PA-C 02/11/21 0104    Ripley Fraise, MD 02/11/21 [phone]

## 2021-02-11 NOTE — ED Notes (Signed)
Critical troponin of 245 given to kelly pa. In triage

## 2021-02-11 NOTE — ED Notes (Signed)
MD Horton made aware of trop 305.

## 2021-02-11 NOTE — ED Provider Notes (Signed)
[facility] EMERGENCY DEPARTMENT Provider Note   CSN: [id number] Arrival date & time: 02/11/21  0050     History Chief Complaint  Patient presents with   Chest Pain    Monique Henderson is a 70 y.o. female.  HPI     This is a 70 year old female with a history of end-stage renal disease on dialysis Tuesday, Thursday, Saturday, aortic dissection s/p repair, hypertension, anemia who presents with shortness of breath.  Patient reports that she feels like she has a lot of fluid on board.  She is due for dialysis later today.  She reports she had a full dialysis session on Saturday.  She states she has had generalized increasing fatigue and has had some associated chest discomfort.  She states that it is worse with coughing and deep breathing.  She not had any fevers.  She has not been taking her blood pressure medications as prescribed over the last several days.  No known sick contacts or COVID exposures.  Past Medical History:  Diagnosis Date   AF (paroxysmal atrial fibrillation) ([place]) 05/29/2019   on Coumadin   Aortic atherosclerosis ([place]) 07/05/2019   Aortic dissection ([place]) 04/04/2019   s/p repair   Bone spur 2008   Right calcaneal foot spur   Breast cancer ([place]) 2004   Ductal carcinoma in situ of the left breast; S/P left partial mastectomy 02/26/2003; S/P re-excision of cranial and lateral margins11/18/2004.radiation   Cerebral thrombosis with cerebral infarction 05/22/2019   Chronic low back pain 06/22/2016   Chronic obstructive lung disease ([place]) 01/16/2017   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 Autumn Messing; S/P re-excision of superior and inferior margins 10/27/2012.    ESRD on dialysis ([place]) 05/29/2019   Essential hypertension 09/16/2006   GERD 09/16/2006   Hepatitis C    treated and RNA confirmed not detectable 01/2017   Insomnia 03/14/2015   Malnutrition of moderate degree 05/19/2019    Non compliance with medical treatment 12/04/2017   Normocytic anemia    With thrombocytosis   Osteoarthritis    Right ureteral stone 2002   S/P lumbar spinal fusion 01/18/2014   S/P lumbar decompressive laminectomy, fusion, and plating for lumbar spinal stenosis on 05/27/2009 by Dr. Eustace Moore.  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 Eustace Moore.     Tobacco use disorder 04/19/2009   Uterine fibroid    Wears dentures    top    Patient Active Problem List   Diagnosis Date Noted   Acute pulmonary edema ([place]) 12/23/2020   Acute respiratory failure with hypoxia ([place]) 09/21/2020   Acute renal failure superimposed on chronic kidney disease ([place]) 09/21/2020   Community acquired pneumonia 09/21/2020   Uremia 47/01/6282   Metabolic acidosis 66/29/4765   Hypertensive urgency 09/21/2020   On Coumadin for atrial fibrillation ([place]) 10/03/2019   Troponin level elevated 08/28/2019   Positive D dimer 08/28/2019   Hyperkalemia 08/28/2019   SVT (supraventricular tachycardia) ([place]) 08/28/2019   SIRS (systemic inflammatory response syndrome) ([place]) 08/27/2019   Paroxysmal SVT (supraventricular tachycardia) ([place]) 08/27/2019   Aortic atherosclerosis ([place]) 07/05/2019   Chest pain 07/05/2019   History of CVA (cerebrovascular accident) 05/29/2019   AF (paroxysmal atrial fibrillation) ([place]) 05/29/2019   ESRD on dialysis ([place]) 05/29/2019  Cerebral thrombosis with cerebral infarction 05/22/2019   Malnutrition of moderate degree 05/19/2019   Pleural effusion 05/18/2019   Atrial fibrillation with RVR ([place]) 05/18/2019   S/P aortic aneurysm repair 04/07/2019   Aortic dissection ([place]) 04/04/2019   Dissection of aorta ([place]) 04/03/2019   Non compliance with medical treatment 12/04/2017   CKD (chronic kidney  disease), stage IV ([place]) 07/19/2017   Chronic obstructive lung disease ([place]) 01/16/2017   Chronic low back pain 06/22/2016   Insomnia 03/14/2015   S/P lumbar spinal fusion 01/18/2014   Tobacco use disorder 04/19/2009   Essential hypertension 09/16/2006   GERD 09/16/2006    Past Surgical History:  Procedure Laterality Date   ANTERIOR LAT LUMBAR FUSION N/A 01/18/2014   Procedure: ANTERIOR LATERAL LUMBAR FUSION LUMBAR TWO-THREE;  Surgeon: Eustace Moore, MD;  Location: [place] [facility];  Service: Neurosurgery;  Laterality: N/A;   ANTERIOR LUMBAR FUSION  01/18/2014   AV FISTULA PLACEMENT Left 04/20/2019   Procedure: ARTERIOVENOUS (AV) FISTULA CREATION;  Surgeon: [name], MD;  Location: [place];  Service: Vascular;  Laterality: Left;   BACK SURGERY     BREAST LUMPECTOMY Left 01/2003   BREAST LUMPECTOMY Right 2014   BREAST LUMPECTOMY WITH NEEDLE LOCALIZATION AND AXILLARY SENTINEL LYMPH NODE BX Right 10/13/2012   Procedure: BREAST LUMPECTOMY WITH NEEDLE LOCALIZATION;  Surgeon: [name], MD;  Location: [place];  Service: General;  Laterality: Right;  Right breast wire localized lumpectomy   INSERTION OF DIALYSIS CATHETER Right 04/20/2019   Procedure: INSERTION OF DIALYSIS CATHETER, right internal jugular;  Surgeon: [name], MD;  Location: [place];  Service: Vascular;  Laterality: Right;   INSERTION OF DIALYSIS CATHETER Right 09/24/2020   Procedure: INSERTION OF TUNNELED DIALYSIS CATHETER;  Surgeon: Rosetta Posner, MD;  Location: [place];  Service: Vascular;  Laterality: Right;   IR FLUORO GUIDE CV LINE RIGHT  09/22/2020   IR THORACENTESIS ASP PLEURAL SPACE W/IMG GUIDE  05/19/2019   IR US GUIDE VASC ACCESS LEFT  09/22/2020   IR US GUIDE VASC ACCESS RIGHT  09/22/2020   IR VENOCAVAGRAM SVC  09/22/2020   LAMINECTOMY  05/27/2009   Lumbar decompressive laminectomy, fusion and plating for lumbar spinal stensosis   LIGATION OF ARTERIOVENOUS  FISTULA Left  09/24/2020   Procedure: LIGATION OF LEFT ARM ARTERIOVENOUS  FISTULA;  Surgeon: Rosetta Posner, MD;  Location: [place];  Service: Vascular;  Laterality: Left;   LUMBAR LAMINECTOMY/DECOMPRESSION MICRODISCECTOMY Left 03/23/2013   Procedure: LUMBAR LAMINECTOMY/DECOMPRESSION MICRODISCECTOMY 1 LEVEL;  Surgeon: Eustace Moore, MD;  Location: [facility];  Service: Neurosurgery;  Laterality: Left;  LUMBAR LAMINECTOMY/DECOMPRESSION MICRODISCECTOMY 1 LEVEL   MASTECTOMY, PARTIAL Left 02/26/2003   ; S/P re-excision of cranial and lateral margins 04/19/2003.    RE-EXCISION OF BREAST CANCER,SUPERIOR MARGINS Right 10/27/2012   Procedure: RE-EXCISION OF BREAST CANCER,SUPERIOR and inferior MARGINS;  Surgeon: [name], MD;  Location: [place];  Service: General;  Laterality: Right;   RE-EXCISION OF BREAST LUMPECTOMY Left 04/2003   TEE WITHOUT CARDIOVERSION N/A 04/04/2019   Procedure: Transesophageal Echocardiogram ([place]);  Surgeon: Wonda Olds, MD;  Location: [place];  Service: Open Heart Surgery;  Laterality: N/A;   THORACIC AORTIC ANEURYSM REPAIR N/A 04/04/2019   Procedure: THORACIC ASCENDING ANEURYSM REPAIR (AAA)  USING 28 MM X 30 CM HEMASHIELD PLATINUM VASCULAR GRAFT;  Surgeon: Wonda Olds, MD;  Location: [facility];  Service: Open Heart Surgery;  Laterality: N/A;  OB History     Gravida  2   Para  2   Term  1   Preterm  1   AB      Living         SAB      IAB      Ectopic      Multiple      Live Births              Family History  Problem Relation Age of Onset   Colon cancer Mother 88   Hypertension Mother    Diabetes Sister 15   Hypertension Sister    Diabetes Brother    Hypertension Brother    Diabetes Brother    Hypertension Brother    Kidney disease Son        s/p renal transplant   Hypertension Son    Diabetes Son    Multiple sclerosis Son    Bone cancer Sister 54   Breast cancer Neg Hx    Cervical cancer Neg Hx     Social History   Tobacco Use    Smoking status: Former    Packs/day: 0.25    Years: 44.00    Pack years: 11.00    Types: Cigarettes    Quit date: 05/24/2019    Years since quitting: 1.7   Smokeless tobacco: Never   Tobacco comments:    smoking less  Vaping Use   Vaping Use: Never used  Substance Use Topics   Alcohol use: Not Currently    Alcohol/week: 2.0 standard drinks    Types: 2 Cans of beer per week    Comment:  "couple beers a weekend"   Drug use: Not Currently    Types: Cocaine    Comment: "stopped back in the 1980's"    Home Medications Prior to Admission medications   Medication Sig Start Date End Date Taking? Authorizing Provider  acetaminophen (TYLENOL) 325 MG tablet Take 650 mg by mouth every 6 (six) hours as needed for mild pain.    [provider]  albuterol (VENTOLIN HFA) 108 (90 Base) MCG/ACT inhaler Inhale 2 puffs into the lungs every 6 (six) hours as needed for wheezing or shortness of breath. 07/30/20   Maximiano Coss, NP  amLODipine (NORVASC) 10 MG tablet Take 1 tablet (10 mg total) by mouth daily. 12/24/20   Geradine Girt, DO  cloNIDine (CATAPRES - DOSED IN MG/24 HR) 0.3 mg/24hr patch Place 1 patch (0.3 mg total) onto the skin every Saturday. 12/28/20   Geradine Girt, DO  cyclobenzaprine (FLEXERIL) 5 MG tablet Take 5 mg by mouth 2 (two) times daily as needed for muscle spasms. 09/18/20   [provider]  furosemide (LASIX) 20 MG tablet Take 20 mg by mouth every other day. 10/31/20   [provider]  hydrALAZINE (APRESOLINE) 25 MG tablet Take 1 tablet (25 mg total) by mouth 2 (two) times daily. 12/24/20   Geradine Girt, DO  loperamide (IMODIUM) 2 MG capsule Take 2 mg by mouth daily. 07/15/20   [provider]  losartan (COZAAR) 100 MG tablet Take 1 tablet (100 mg total) by mouth daily. 12/24/20   Geradine Girt, DO  metoprolol tartrate (LOPRESSOR) 25 MG tablet Take 3 tablets (75 mg total) by mouth 2 (two) times daily. 12/24/20   Geradine Girt, DO  mirtazapine  (REMERON) 7.5 MG tablet Take 1 tablet (7.5 mg total) by mouth at bedtime. Patient taking differently: Take 7.5 mg by mouth  daily in the afternoon. 07/15/20   Maximiano Coss, NP  [facility] 4 MG/0.1ML LIQD nasal spray kit Place 1 spray into the nose as needed for opioid reversal. If no response within 3 minutes then repeat once in the other nostril.  Call 911. 05/15/20   [provider]  ondansetron (ZOFRAN) 4 MG tablet Take 1 tablet (4 mg total) by mouth every 8 (eight) hours as needed for nausea or vomiting. 08/03/19   Jacelyn Pi, Lilia Argue, MD  oxyCODONE-acetaminophen (PERCOCET) 10-325 MG tablet Take 1 tablet by mouth 3 (three) times daily as needed for severe pain. 09/18/20   [provider]  rosuvastatin (CRESTOR) 10 MG tablet TAKE 1 TABLET (10 MG TOTAL) BY MOUTH DAILY. 07/03/20 12/23/20  Patwardhan, Reynold Bowen, MD  VITAMIN D PO Take 1 tablet by mouth daily.    [provider]  VITAMIN E PO Take 1 tablet by mouth daily.    [provider]  warfarin (COUMADIN) 6 MG tablet Take 1 tablet (6 mg total) by mouth 4 (four) times a week. Alternates with 7.$RemoveBeforeDEI'5mg'hwIvmLvnYnUCeOSg$  tablet 12/24/20   Eulogio Bear U, DO  warfarin (COUMADIN) 7.5 MG tablet Take 1 tablet (7.5 mg total) by mouth 3 (three) times a week. Alternates with $RemoveBeforeD'6mg'wohxudjycdfuyC$  tablet 12/25/20   Geradine Girt, DO    Allergies    Shrimp [shellfish allergy], Bactroban [mupirocin], Vicodin [hydrocodone-acetaminophen], Eliquis [apixaban], Lisinopril, and Tylenol [acetaminophen]  Review of Systems   Review of Systems  Constitutional:  Negative for fever.  Respiratory:  Positive for cough.   Cardiovascular:  Positive for chest pain. Negative for leg swelling.  Gastrointestinal:  Negative for abdominal pain, nausea and vomiting.  Genitourinary:  Negative for dysuria.  All other systems reviewed and are negative.  Physical Exam Updated Vital Signs BP (!) 166/127   Pulse 80   Temp 98.1 F (36.7 C) (Oral)   Resp 19   SpO2 100%   Physical  Exam Vitals and nursing note reviewed.  Constitutional:      Appearance: She is well-developed.     Comments: Frail, chronically ill-appearing  HENT:     Head: Normocephalic and atraumatic.  Eyes:     Pupils: Pupils are equal, round, and reactive to light.  Cardiovascular:     Rate and Rhythm: Normal rate and regular rhythm.     Heart sounds: Normal heart sounds.     Comments: Indwelling catheter right upper chest, no adjacent erythema Well-healed midline sternotomy scar Pulmonary:     Effort: Pulmonary effort is normal. No respiratory distress.     Breath sounds: No wheezing.  Abdominal:     General: Bowel sounds are normal.     Palpations: Abdomen is soft.  Musculoskeletal:     Cervical back: Neck supple.     Right lower leg: No tenderness. No edema.     Left lower leg: No tenderness. No edema.  Skin:    General: Skin is warm and dry.  Neurological:     Mental Status: She is alert and oriented to person, place, and time.  Psychiatric:        Mood and Affect: Mood normal.    ED Results / Procedures / Treatments   Labs (all labs ordered are listed, but only abnormal results are displayed) Labs Reviewed  BASIC METABOLIC PANEL - Abnormal; Notable for the following components:      Result Value   Glucose, Bld 134 (*)    BUN 46 (*)    Creatinine, Ser 9.89 (*)  GFR, Estimated 4 (*)    Anion gap 16 (*)    All other components within normal limits  CBC - Abnormal; Notable for the following components:   RBC 3.80 (*)    Hemoglobin 10.7 (*)    HCT 35.9 (*)    MCHC 29.8 (*)    RDW 17.2 (*)    All other components within normal limits  TROPONIN I (HIGH SENSITIVITY) - Abnormal; Notable for the following components:   Troponin I (High Sensitivity) 245 (*)    All other components within normal limits  TROPONIN I (HIGH SENSITIVITY) - Abnormal; Notable for the following components:   Troponin I (High Sensitivity) 302 (*)    All other components within normal limits   TROPONIN I (HIGH SENSITIVITY) - Abnormal; Notable for the following components:   Troponin I (High Sensitivity) 305 (*)    All other components within normal limits  CBG MONITORING, ED  TROPONIN I (HIGH SENSITIVITY)    EKG EKG Interpretation  Date/Time:  Tuesday February 11 2021 00:57:11 EDT Ventricular Rate:  117 PR Interval:  150 QRS Duration: 80 QT Interval:  340 QTC Calculation: 474 R Axis:   30 Text Interpretation: Sinus tachycardia Biatrial enlargement Minimal voltage criteria for LVH, may be normal variant ( Cornell product ) T wave abnormality, consider lateral ischemia Abnormal ECG No significant change since last tracing Confirmed by Thayer Jew [phone]) on 02/11/2021 5:03:56 AM Also confirmed by Thayer Jew [phone]), editor Ouida Sills, Jeannetta Nap [phone])  on 02/11/2021 7:11:57 AM  Radiology DG Chest 2 View  Result Date: 02/11/2021 CLINICAL DATA:  Chest pain EXAM: CHEST - 2 VIEW COMPARISON:  12/23/2020 FINDINGS: There is a right chest wall power-injectable Port-A-Cath with tip in the proximal right atrium via a right internal jugular vein approach. Mild cardiomegaly. No acute airspace disease. Remote median sternotomy. IMPRESSION: Mild cardiomegaly without acute airspace disease. Electronically Signed   By: Ulyses Jarred M.D.   On: 02/11/2021 01:57    Procedures Procedures   Medications Ordered in ED Medications  amLODipine (NORVASC) tablet 10 mg (10 mg Oral Given 02/11/21 0609)  metoprolol tartrate (LOPRESSOR) tablet 75 mg (75 mg Oral Given 02/11/21 0610)  oxyCODONE-acetaminophen (PERCOCET/ROXICET) 5-325 MG per tablet 1 tablet (1 tablet Oral Given 02/11/21 2122)    ED Course  I have reviewed the triage vital signs and the nursing notes.  Pertinent labs & imaging results that were available during my care of the patient were reviewed by me and considered in my medical decision making (see chart for details).    MDM Rules/Calculators/A&P                            Patient presents with shortness of breath.  Has not taken her blood pressure medicines in several days.  Notably hypertensive on arrival with blood pressures in the 482 systolic range.  She is not having any active chest pain.  EKG shows no evidence of acute ischemia.  Initial troponin was elevated.  At baseline she does have elevated troponins although today it is slightly more elevated than normal at 245.  Repeat is 302.  Again no active chest pain.  I highly suspect that this is related to her ongoing hypertension.  She has previously been admitted for hypertensive urgency and emergency.  She was given her daily blood pressure medication of Norvasc and metoprolol.  Her blood pressure down trended nicely.  Third troponin was ordered for stability.  Repeat is  305.  Patient is now asymptomatic.  She is due for dialysis later today which I think will also very much help her blood pressures.  I highly doubt her troponin is reflective of true ACS.  Likely combination of her end-stage renal disease and ongoing hypertension.  She had a stress test in 2021 that was normal.  Recommend follow-up with her cardiologist and proceeding to dialysis later today.  After history, exam, and medical workup I feel the patient has been appropriately medically screened and is safe for discharge home. Pertinent diagnoses were discussed with the patient. Patient was given return precautions.  Final Clinical Impression(s) / ED Diagnoses Final diagnoses:  Uncontrolled hypertension  Elevated troponin  SOB (shortness of breath)    Rx / DC Orders ED Discharge Orders     None        Mayleigh Tetrault, Barbette Hair, MD 02/11/21 [phone]

## 2021-02-11 NOTE — ED Notes (Signed)
Patient verbalizes understanding of discharge instructions. Opportunity for questioning and answers were provided. Armband removed by staff, pt discharged from ED and ambulated to lobby to return home.   

## 2021-02-11 NOTE — ED Triage Notes (Signed)
Patient from home, complaining of shortness of breath and chest pain, substernal, increases in pain with inhalation and coughing. Patient is a smoker.  Patient is a dialysis pt, hypertensive.  TThSat and went on Sat.  She has not taken her HTN meds for a couple of days, she states she has not felt well.

## 2021-02-11 NOTE — ED Notes (Addendum)
Pt c/o SHOB. Pt 02 sat 97, but RR in upper 20-s to low 30's. HOB raised. 2L [place] applied for comfort.

## 2021-02-11 NOTE — ED Notes (Signed)
At discharge pt off of O2, O2 sat 95% RA, R 25, pt comfortable.

## 2021-02-12 ENCOUNTER — Ambulatory Visit: Payer: Medicare Other | Admitting: Registered Nurse

## 2021-02-14 ENCOUNTER — Other Ambulatory Visit: Payer: Self-pay

## 2021-02-14 ENCOUNTER — Encounter: Payer: Self-pay | Admitting: Registered Nurse

## 2021-02-14 ENCOUNTER — Ambulatory Visit (INDEPENDENT_AMBULATORY_CARE_PROVIDER_SITE_OTHER): Payer: Medicare Other | Admitting: Registered Nurse

## 2021-02-14 VITALS — BP 180/70 | HR 61 | Temp 98.2°F | Resp 17 | Ht 63.0 in | Wt 94.2 lb

## 2021-02-14 DIAGNOSIS — N186 End stage renal disease: Secondary | ICD-10-CM

## 2021-02-14 DIAGNOSIS — Z8673 Personal history of transient ischemic attack (TIA), and cerebral infarction without residual deficits: Secondary | ICD-10-CM

## 2021-02-14 DIAGNOSIS — I700 Atherosclerosis of aorta: Secondary | ICD-10-CM | POA: Diagnosis not present

## 2021-02-14 DIAGNOSIS — I10 Essential (primary) hypertension: Secondary | ICD-10-CM

## 2021-02-14 DIAGNOSIS — E7800 Pure hypercholesterolemia, unspecified: Secondary | ICD-10-CM | POA: Insufficient documentation

## 2021-02-14 LAB — COMPREHENSIVE METABOLIC PANEL
ALT: 17 U/L (ref 0–35)
AST: 18 U/L (ref 0–37)
Albumin: 4.2 g/dL (ref 3.5–5.2)
Alkaline Phosphatase: 111 U/L (ref 39–117)
BUN: 27 mg/dL — ABNORMAL HIGH (ref 6–23)
CO2: 29 mEq/L (ref 19–32)
Calcium: 10.1 mg/dL (ref 8.4–10.5)
Chloride: 97 mEq/L (ref 96–112)
Creatinine, Ser: 5.22 mg/dL (ref 0.40–1.20)
GFR: 7.97 mL/min — CL (ref >60.00)
Glucose, Bld: 98 mg/dL (ref 70–99)
Potassium: 4.3 mEq/L (ref 3.5–5.1)
Sodium: 141 mEq/L (ref 135–145)
Total Bilirubin: 0.5 mg/dL (ref 0.2–1.2)
Total Protein: 7.5 g/dL (ref 6.0–8.3)

## 2021-02-14 LAB — CBC WITH DIFFERENTIAL/PLATELET
Basophils Absolute: 0 10*3/uL (ref 0.0–0.1)
Basophils Relative: 1.1 % (ref 0.0–3.0)
Eosinophils Absolute: 0.2 10*3/uL (ref 0.0–0.7)
Eosinophils Relative: 4.8 % (ref 0.0–5.0)
HCT: 37.3 % (ref 36.0–46.0)
Hemoglobin: 11.7 g/dL — ABNORMAL LOW (ref 12.0–15.0)
Lymphocytes Relative: 12.3 % (ref 12.0–46.0)
Lymphs Abs: 0.6 10*3/uL — ABNORMAL LOW (ref 0.7–4.0)
MCHC: 31.3 g/dL (ref 30.0–36.0)
MCV: 90.5 fl (ref 78.0–100.0)
Monocytes Absolute: 0.2 10*3/uL (ref 0.1–1.0)
Monocytes Relative: 5.2 % (ref 3.0–12.0)
Neutro Abs: 3.5 10*3/uL (ref 1.4–7.7)
Neutrophils Relative %: 76.6 % (ref 43.0–77.0)
Platelets: 217 10*3/uL (ref 150.0–400.0)
RBC: 4.12 Mil/uL (ref 3.87–5.11)
RDW: 18.3 % — ABNORMAL HIGH (ref 11.5–15.5)
WBC: 4.5 10*3/uL (ref 4.0–10.5)

## 2021-02-14 LAB — LIPID PANEL
Cholesterol: 130 mg/dL (ref 0–200)
HDL: 52 mg/dL (ref >39.00)
LDL Cholesterol: 67 mg/dL (ref 0–99)
NonHDL: 77.82
Total CHOL/HDL Ratio: 2
Triglycerides: 54 mg/dL (ref 0.0–149.0)
VLDL: 10.8 mg/dL (ref 0.0–40.0)

## 2021-02-14 LAB — HEMOGLOBIN A1C: Hgb A1c MFr Bld: 5.9 % (ref 4.6–6.5)

## 2021-02-14 MED ORDER — HYDRALAZINE HCL 50 MG PO TABS: 50.0000 mg | ORAL_TABLET | Freq: Three times a day (TID) | ORAL | 0 refills | Status: DC

## 2021-02-14 MED ORDER — CLONIDINE 0.3 MG/24HR TD PTWK: 0.3000 mg | MEDICATED_PATCH | TRANSDERMAL | 0 refills | Status: DC

## 2021-02-14 MED ORDER — LOSARTAN POTASSIUM 100 MG PO TABS: 100.0000 mg | ORAL_TABLET | Freq: Every day | ORAL | 0 refills | Status: DC

## 2021-02-14 MED ORDER — ROSUVASTATIN CALCIUM 10 MG PO TABS: 10.0000 mg | ORAL_TABLET | Freq: Every day | ORAL | 0 refills | Status: DC

## 2021-02-14 MED ORDER — METOPROLOL TARTRATE 25 MG PO TABS: 75.0000 mg | ORAL_TABLET | Freq: Two times a day (BID) | ORAL | 0 refills | Status: DC

## 2021-02-14 MED ORDER — AMLODIPINE BESYLATE 10 MG PO TABS: 10.0000 mg | ORAL_TABLET | Freq: Every day | ORAL | 0 refills | Status: DC

## 2021-02-14 NOTE — Progress Notes (Signed)
Established Patient Office Visit  Subjective:  Patient ID: Monique Henderson, female    DOB: September 21, 1950  Age: 70 y.o. MRN: [id number]  CC:  Chief Complaint  Patient presents with   Follow-up    Med recheck    HPI Monique Henderson presents for med check  Hypertension: In the setting of ESRD Patient Currently taking: amlodipine 69m po qd, metoprolol 750mpo bid, hydralazine 2537mo bid, furosemide 53m73m every other day, losartan 100mg110mqd, clonidone 0.3mg 23mpatch every Saturday. Poorly controlled historically. Pt noncompliant with medications and follow up  She is on dialysis tiw Denies CV symptoms including: chest pain, shob, doe, headache, visual changes, fatigue, claudication, and dependent edema.   Previous readings and labs: BP Readings from Last 3 Encounters:  02/14/21 (!) 180/70  02/11/21 (!) 167/121  12/24/20 (!) 175/100   Lab Results  Component Value Date   CREATININE 9.89 (H) 02/11/2021    HLD Rosuvastatin 10mg p51m Good effect, no AE Has run out Lab Results  Component Value Date   CHOL 167 05/13/2020   HDL 39 (L) 05/13/2020   LDLCALC 100 (H) 05/13/2020   TRIG 162 (H) 05/13/2020   CHOLHDL 4.3 05/13/2020       Past Medical History:  Diagnosis Date   AF (paroxysmal atrial fibrillation) (HCC) 12[place]/2020   on Coumadin   Aortic atherosclerosis (HCC) 02[place]/2021   Aortic dissection (HCC) 11[place]/2020   s/p repair   Bone spur 2008   Right calcaneal foot spur   Breast cancer (HCC) 20[place]  Ductal carcinoma in situ of the left breast; S/P left partial mastectomy 02/26/2003; S/P re-excision of cranial and lateral margins11/18/2004.radiation   Cerebral thrombosis with cerebral infarction 05/22/2019   Chronic low back pain 06/22/2016   Chronic obstructive lung disease (HCC) 08[place]/2018   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 Paul ToAutumn Messinge-excision of superior and inferior margins  10/27/2012.    ESRD on dialysis (HCC) 12[place]/2020   Essential hypertension 09/16/2006   GERD 09/16/2006   Hepatitis C    treated and RNA confirmed not detectable 01/2017   Insomnia 03/14/2015   Malnutrition of moderate degree 05/19/2019   Non compliance with medical treatment 12/04/2017   Normocytic anemia    With thrombocytosis   Osteoarthritis    Right ureteral stone 2002   S/P lumbar spinal fusion 01/18/2014   S/P lumbar decompressive laminectomy, fusion, and plating for lumbar spinal stenosis on 05/27/2009 by Dr. David SEustace Mooreanterolateral retroperitoneal interbody fusion L2-3 utilizing a 8 mm peek interbody cage packed with morcellized allograft, and anterior lumbar plating L2-3 for recurrent disc herniation L2-3 with spinal stenosis on 01/18/2014 by Dr. David SEustace Mooreobacco use disorder 04/19/2009   Uterine fibroid    Wears dentures    top    Past Surgical History:  Procedure Laterality Date   ANTERIOR LAT LUMBAR FUSION N/A 01/18/2014   Procedure: ANTERIOR LATERAL LUMBAR FUSION LUMBAR TWO-THREE;  Surgeon: David SEustace Moore[facility];  Service: Neurosurgery;  Laterality: N/A;   ANTERIOR LUMBAR FUSION  01/18/2014   AV FISTULA PLACEMENT Left 04/20/2019   Procedure: ARTERIOVENOUS (AV) FISTULA CREATION;  Surgeon: Cain, BWaynetta SandyLocation: [facility] OR; [place]: [facility];  Laterality: Left;   BACK SURGERY     BREAST LUMPECTOMY Left 01/2003   BREAST LUMPECTOMY Right 2014   BREAST LUMPECTOMY  WITH NEEDLE LOCALIZATION AND AXILLARY SENTINEL LYMPH NODE BX Right 10/13/2012   Procedure: BREAST LUMPECTOMY WITH NEEDLE LOCALIZATION;  Surgeon: [name], MD;  Location: [place];  Service: General;  Laterality: Right;  Right breast wire localized lumpectomy   INSERTION OF DIALYSIS CATHETER Right 04/20/2019   Procedure: INSERTION OF DIALYSIS CATHETER, right internal jugular;  Surgeon: [name], MD;  Location: [place];   Service: Vascular;  Laterality: Right;   INSERTION OF DIALYSIS CATHETER Right 09/24/2020   Procedure: INSERTION OF TUNNELED DIALYSIS CATHETER;  Surgeon: Rosetta Posner, MD;  Location: [place];  Service: Vascular;  Laterality: Right;   IR FLUORO GUIDE CV LINE RIGHT  09/22/2020   IR THORACENTESIS ASP PLEURAL SPACE W/IMG GUIDE  05/19/2019   IR US GUIDE VASC ACCESS LEFT  09/22/2020   IR US GUIDE VASC ACCESS RIGHT  09/22/2020   IR VENOCAVAGRAM SVC  09/22/2020   LAMINECTOMY  05/27/2009   Lumbar decompressive laminectomy, fusion and plating for lumbar spinal stensosis   LIGATION OF ARTERIOVENOUS  FISTULA Left 09/24/2020   Procedure: LIGATION OF LEFT ARM ARTERIOVENOUS  FISTULA;  Surgeon: Rosetta Posner, MD;  Location: [place];  Service: Vascular;  Laterality: Left;   LUMBAR LAMINECTOMY/DECOMPRESSION MICRODISCECTOMY Left 03/23/2013   Procedure: LUMBAR LAMINECTOMY/DECOMPRESSION MICRODISCECTOMY 1 LEVEL;  Surgeon: Eustace Moore, MD;  [facility];  Service: Neurosurgery;  Laterality: Left;  LUMBAR LAMINECTOMY/DECOMPRESSION MICRODISCECTOMY 1 LEVEL   MASTECTOMY, PARTIAL Left 02/26/2003   ; S/P re-excision of cranial and lateral margins 04/19/2003.    RE-EXCISION OF BREAST CANCER,SUPERIOR MARGINS Right 10/27/2012   Procedure: RE-EXCISION OF BREAST CANCER,SUPERIOR and inferior MARGINS;  Surgeon: [name], MD;  Location: [place];  Service: General;  Laterality: Right;   RE-EXCISION OF BREAST LUMPECTOMY Left 04/2003   TEE WITHOUT CARDIOVERSION N/A 04/04/2019   Procedure: Transesophageal Echocardiogram (Tee);  Surgeon: Wonda Olds, MD;  Location: [place];  Service: Open Heart Surgery;  Laterality: N/A;   THORACIC AORTIC ANEURYSM REPAIR N/A 04/04/2019   Procedure: THORACIC ASCENDING ANEURYSM REPAIR (AAA)  USING 28 MM X 30 CM HEMASHIELD PLATINUM VASCULAR GRAFT;  Surgeon: Wonda Olds, MD;  [facility];  Service: Open Heart Surgery;  Laterality: N/A;    Family History  Problem Relation Age of Onset    Colon cancer Mother 79   Hypertension Mother    Diabetes Sister 32   Hypertension Sister    Diabetes Brother    Hypertension Brother    Diabetes Brother    Hypertension Brother    Kidney disease Son        s/p renal transplant   Hypertension Son    Diabetes Son    Multiple sclerosis Son    Bone cancer Sister 72   Breast cancer Neg Hx    Cervical cancer Neg Hx     Social History   Socioeconomic History   Marital status: Single    Spouse name: Not on file   Number of children: 2   Years of education: Not on file   Highest education level: Not on file  Occupational History   Not on file  Tobacco Use   Smoking status: Former    Packs/day: 0.25    Years: 44.00    Pack years: 11.00    Types: Cigarettes    Quit date: 05/24/2019    Years since quitting: 1.7   Smokeless tobacco: Never   Tobacco comments:    smoking less  Vaping Use   Vaping Use: Never used  Substance and Sexual Activity   Alcohol use: Not Currently    Alcohol/week: 2.0 standard drinks    Types: 2 Cans of beer per week    Comment:  "couple beers a weekend"   Drug use: Not Currently    Types: Cocaine    Comment: "stopped back in the 1980's"   Sexual activity: Yes    Birth control/protection: Post-menopausal  Other Topics Concern   Not on file  Social History Narrative   Not on file   Social Determinants of Health   Financial Resource Strain: Not on file  Food Insecurity: Not on file  Transportation Needs: Not on file  Physical Activity: Not on file  Stress: Not on file  Social Connections: Not on file  Intimate Partner Violence: Not on file    Outpatient Medications Prior to Visit  Medication Sig Dispense Refill   acetaminophen (TYLENOL) 325 MG tablet Take 650 mg by mouth every 6 (six) hours as needed for mild pain.     albuterol (VENTOLIN HFA) 108 (90 Base) MCG/ACT inhaler Inhale 2 puffs into the lungs every 6 (six) hours as needed for wheezing or shortness of breath. 8.5 g 0    cyclobenzaprine (FLEXERIL) 5 MG tablet Take 5 mg by mouth 2 (two) times daily as needed for muscle spasms.     furosemide (LASIX) 20 MG tablet Take 20 mg by mouth every other day.     loperamide (IMODIUM) 2 MG capsule Take 2 mg by mouth daily.     mirtazapine (REMERON) 7.5 MG tablet Take 1 tablet (7.5 mg total) by mouth at bedtime. (Patient taking differently: Take 7.5 mg by mouth daily in the afternoon.) 90 tablet 3   ondansetron (ZOFRAN) 4 MG tablet Take 1 tablet (4 mg total) by mouth every 8 (eight) hours as needed for nausea or vomiting. 20 tablet 3   VITAMIN D PO Take 1 tablet by mouth daily.     VITAMIN E PO Take 1 tablet by mouth daily.     warfarin (COUMADIN) 6 MG tablet Take 1 tablet (6 mg total) by mouth 4 (four) times a week. Alternates with 7.81m tablet     warfarin (COUMADIN) 7.5 MG tablet Take 1 tablet (7.5 mg total) by mouth 3 (three) times a week. Alternates with 670mtablet     amLODipine (NORVASC) 10 MG tablet Take 1 tablet (10 mg total) by mouth daily. 90 tablet 0   cloNIDine (CATAPRES - DOSED IN MG/24 HR) 0.3 mg/24hr patch Place 1 patch (0.3 mg total) onto the skin every Saturday. 12 patch 0   hydrALAZINE (APRESOLINE) 25 MG tablet Take 1 tablet (25 mg total) by mouth 2 (two) times daily. 180 tablet 0   losartan (COZAAR) 100 MG tablet Take 1 tablet (100 mg total) by mouth daily. 90 tablet 0   metoprolol tartrate (LOPRESSOR) 25 MG tablet Take 3 tablets (75 mg total) by mouth 2 (two) times daily. 540 tablet 0   NARCAN 4 MG/0.1ML LIQD nasal spray kit Place 1 spray into the nose as needed for opioid reversal. If no response within 3 minutes then repeat once in the other nostril.  Call 911. (Patient not taking: Reported on 02/14/2021)     oxyCODONE-acetaminophen (PERCOCET) 10-325 MG tablet Take 1 tablet by mouth 3 (three) times daily as needed for severe pain. (Patient not taking: Reported on 02/14/2021)     rosuvastatin (CRESTOR) 10 MG tablet TAKE 1 TABLET (10 MG TOTAL)  BY MOUTH DAILY. 30  tablet 0   No facility-administered medications prior to visit.    Allergies  Allergen Reactions   Shrimp [Shellfish Allergy] Shortness Of Breath   Bactroban [Mupirocin] Other (See Comments)    "[place] in nose"   Vicodin [Hydrocodone-Acetaminophen] Itching and Nausea And Vomiting    This is patient's home medication   Eliquis [Apixaban] Rash   Lisinopril Cough   Tylenol [Acetaminophen] Itching    ROS Review of Systems  Constitutional: Negative.   HENT: Negative.    Eyes: Negative.   Respiratory: Negative.    Cardiovascular: Negative.   Gastrointestinal: Negative.   Genitourinary: Negative.   Musculoskeletal: Negative.   Skin: Negative.   Neurological: Negative.   Psychiatric/Behavioral: Negative.    All other systems reviewed and are negative.    Objective:    Physical Exam Vitals and nursing note reviewed.  Constitutional:      General: She is not in acute distress.    Appearance: Normal appearance. She is normal weight. She is not ill-appearing, toxic-appearing or diaphoretic.  Cardiovascular:     Rate and Rhythm: Normal rate and regular rhythm.     Heart sounds: Normal heart sounds. No murmur heard.   No friction rub. No gallop.  Pulmonary:     Effort: Pulmonary effort is normal. No respiratory distress.     Breath sounds: Normal breath sounds. No stridor. No wheezing, rhonchi or rales.  Chest:     Chest wall: No tenderness.  Skin:    General: Skin is warm and dry.  Neurological:     General: No focal deficit present.     Mental Status: She is alert and oriented to person, place, and time. Mental status is at baseline.  Psychiatric:        Mood and Affect: Mood normal.        Behavior: Behavior normal.        Thought Content: Thought content normal.        Judgment: Judgment normal.    BP (!) 180/70   Pulse 61   Temp 98.2 F (36.8 C) (Temporal)   Resp 17   Ht 5' 3" (1.6 m)   Wt 94 lb 3.2 oz (42.7 kg)   SpO2 96%   BMI 16.69 kg/m  Wt Readings  from Last 3 Encounters:  02/14/21 94 lb 3.2 oz (42.7 kg)  12/24/20 98 lb 1.7 oz (44.5 kg)  11/27/20 92 lb 9.5 oz (42 kg)     Health Maintenance Due  Topic Date Due   TETANUS/TDAP  Never done   Zoster Vaccines- Shingrix (1 of 2) Never done   PNA vac Low Risk Adult (2 of 2 - PPSV23) 12/23/2018    There are no preventive care reminders to display for this patient.  Lab Results  Component Value Date   TSH 1.396 08/27/2019   Lab Results  Component Value Date   WBC 6.7 02/11/2021   HGB 10.7 (L) 02/11/2021   HCT 35.9 (L) 02/11/2021   MCV 94.5 02/11/2021   PLT 216 02/11/2021   Lab Results  Component Value Date   NA 138 02/11/2021   K 4.3 02/11/2021   CO2 24 02/11/2021   GLUCOSE 134 (H) 02/11/2021   BUN 46 (H) 02/11/2021   CREATININE 9.89 (H) 02/11/2021   BILITOT 0.6 11/27/2020   ALKPHOS 77 11/27/2020   AST 23 11/27/2020   ALT 20 11/27/2020   PROT 7.0 11/27/2020   ALBUMIN 3.6 11/27/2020   CALCIUM 10.1 02/11/2021  ANIONGAP 16 (H) 02/11/2021   Lab Results  Component Value Date   CHOL 167 05/13/2020   Lab Results  Component Value Date   HDL 39 (L) 05/13/2020   Lab Results  Component Value Date   LDLCALC 100 (H) 05/13/2020   Lab Results  Component Value Date   TRIG 162 (H) 05/13/2020   Lab Results  Component Value Date   CHOLHDL 4.3 05/13/2020   Lab Results  Component Value Date   HGBA1C 5.5 05/13/2020      Assessment & Plan:   Problem List Items Addressed This Visit       Cardiovascular and Mediastinum   Essential hypertension (Chronic)   Relevant Medications   metoprolol tartrate (LOPRESSOR) 25 MG tablet   hydrALAZINE (APRESOLINE) 50 MG tablet   losartan (COZAAR) 100 MG tablet   amLODipine (NORVASC) 10 MG tablet   rosuvastatin (CRESTOR) 10 MG tablet   cloNIDine (CATAPRES - DOSED IN MG/24 HR) 0.3 mg/24hr patch (Start on 02/15/2021)   Other Relevant Orders   CBC with Differential/Platelet   Comprehensive metabolic panel   Hemoglobin A1c    Aortic atherosclerosis (HCC)   Relevant Medications   metoprolol tartrate (LOPRESSOR) 25 MG tablet   hydrALAZINE (APRESOLINE) 50 MG tablet   losartan (COZAAR) 100 MG tablet   amLODipine (NORVASC) 10 MG tablet   rosuvastatin (CRESTOR) 10 MG tablet   cloNIDine (CATAPRES - DOSED IN MG/24 HR) 0.3 mg/24hr patch (Start on 02/15/2021)     Other   Pure hypercholesterolemia   Relevant Medications   metoprolol tartrate (LOPRESSOR) 25 MG tablet   hydrALAZINE (APRESOLINE) 50 MG tablet   losartan (COZAAR) 100 MG tablet   amLODipine (NORVASC) 10 MG tablet   rosuvastatin (CRESTOR) 10 MG tablet   cloNIDine (CATAPRES - DOSED IN MG/24 HR) 0.3 mg/24hr patch (Start on 02/15/2021)   Other Relevant Orders   Lipid panel   Other Visit Diagnoses     ESRD (end stage renal disease) ([place])    -  Primary   Relevant Orders   CBC with Differential/Platelet   Comprehensive metabolic panel   Hemoglobin A1c   H/O: stroke       Relevant Medications   rosuvastatin (CRESTOR) 10 MG tablet       Meds ordered this encounter  Medications   metoprolol tartrate (LOPRESSOR) 25 MG tablet    Sig: Take 3 tablets (75 mg total) by mouth 2 (two) times daily.    Dispense:  540 tablet    Refill:  0    Order Specific Question:   Supervising Provider    Answer:   Carlota Raspberry, JEFFREY R [[id number]]   hydrALAZINE (APRESOLINE) 50 MG tablet    Sig: Take 1 tablet (50 mg total) by mouth 3 (three) times daily.    Dispense:  270 tablet    Refill:  0    Order Specific Question:   Supervising Provider    Answer:   Carlota Raspberry, JEFFREY R [[id number]]   losartan (COZAAR) 100 MG tablet    Sig: Take 1 tablet (100 mg total) by mouth daily.    Dispense:  90 tablet    Refill:  0    Order Specific Question:   Supervising Provider    Answer:   Carlota Raspberry, JEFFREY R [[id number]]   amLODipine (NORVASC) 10 MG tablet    Sig: Take 1 tablet (10 mg total) by mouth daily.    Dispense:  90 tablet    Refill:  0    Order Specific Question:  Supervising Provider    Answer:    Carlota Raspberry, JEFFREY R [[id number]]   rosuvastatin (CRESTOR) 10 MG tablet    Sig: Take 1 tablet (10 mg total) by mouth daily.    Dispense:  90 tablet    Refill:  0    Order Specific Question:   Supervising Provider    Answer:   Carlota Raspberry, JEFFREY R [[id number]]   cloNIDine (CATAPRES - DOSED IN MG/24 HR) 0.3 mg/24hr patch    Sig: Place 1 patch (0.3 mg total) onto the skin every Saturday.    Dispense:  12 patch    Refill:  0    Order Specific Question:   Supervising Provider    Answer:   Carlota Raspberry, JEFFREY R [[id number]]     Follow-up: Return in about 3 weeks (around 03/07/2021) for Nurse visit BP check.   PLAN BP far too high today. Asymptomatic hypertensive urgency.  Increase hydralazine to 98m po tid. Maintain rest of regimen. Labs collected. Will follow up with the patient as warranted..Marland Kitchenknown esrd, expect urgent results in GFR and Cr. Likely significant anemia due to esrd. Return in 3 weeks for BP check with provider. Patient encouraged to call clinic with any questions, comments, or concerns.  RMaximiano Coss NP

## 2021-02-14 NOTE — Patient Instructions (Addendum)
Ms. Grayer -  Your blood pressure is far too high. I want to increase your hydralazine to 50mg  three times daily. Ok to double up on your current dose (25mg ) until you run out.  I want to have you stop by an office - here or [facility] - in 2-3 weeks for a blood pressure check.  At [facility], I can recommend Dr. Agustina Caroli, Dr. Pricilla Holm, and Jeralyn Ruths, NP. Reach out to them at [phone] to get scheduled!  Labs today will be back this afternoon. Expecting to see signs of renal failure - anemia, high Cr, low GFR.   Reach out in the mean time with any concerns  Thanks,  Rich

## 2021-02-21 ENCOUNTER — Encounter: Payer: Self-pay | Admitting: Registered Nurse

## 2021-04-04 ENCOUNTER — Inpatient Hospital Stay: Admission: RE | Admit: 2021-04-04 | Payer: Medicare Other | Source: Ambulatory Visit

## 2021-04-30 DIAGNOSIS — I5042 Chronic combined systolic (congestive) and diastolic (congestive) heart failure: Secondary | ICD-10-CM | POA: Insufficient documentation

## 2021-04-30 DIAGNOSIS — I5032 Chronic diastolic (congestive) heart failure: Secondary | ICD-10-CM | POA: Insufficient documentation

## 2021-06-04 ENCOUNTER — Inpatient Hospital Stay (HOSPITAL_COMMUNITY)
Admission: EM | Admit: 2021-06-04 | Discharge: 2021-06-09 | DRG: 291 | Disposition: A | Discharge status: Home Health | Payer: Medicare Other | Attending: Internal Medicine | Admitting: Internal Medicine

## 2021-06-04 ENCOUNTER — Emergency Department (HOSPITAL_COMMUNITY): Payer: Medicare Other

## 2021-06-04 DIAGNOSIS — I169 Hypertensive crisis, unspecified: Secondary | ICD-10-CM | POA: Diagnosis present

## 2021-06-04 DIAGNOSIS — I10 Essential (primary) hypertension: Secondary | ICD-10-CM | POA: Diagnosis present

## 2021-06-04 DIAGNOSIS — D631 Anemia in chronic kidney disease: Secondary | ICD-10-CM | POA: Diagnosis present

## 2021-06-04 DIAGNOSIS — M898X9 Other specified disorders of bone, unspecified site: Secondary | ICD-10-CM | POA: Diagnosis present

## 2021-06-04 DIAGNOSIS — I160 Hypertensive urgency: Secondary | ICD-10-CM | POA: Diagnosis present

## 2021-06-04 DIAGNOSIS — Z833 Family history of diabetes mellitus: Secondary | ICD-10-CM

## 2021-06-04 DIAGNOSIS — Z8679 Personal history of other diseases of the circulatory system: Secondary | ICD-10-CM

## 2021-06-04 DIAGNOSIS — N186 End stage renal disease: Secondary | ICD-10-CM

## 2021-06-04 DIAGNOSIS — E785 Hyperlipidemia, unspecified: Secondary | ICD-10-CM | POA: Diagnosis present

## 2021-06-04 DIAGNOSIS — Z981 Arthrodesis status: Secondary | ICD-10-CM

## 2021-06-04 DIAGNOSIS — Z9889 Other specified postprocedural states: Secondary | ICD-10-CM

## 2021-06-04 DIAGNOSIS — U071 COVID-19: Secondary | ICD-10-CM | POA: Diagnosis present

## 2021-06-04 DIAGNOSIS — J810 Acute pulmonary edema: Secondary | ICD-10-CM | POA: Diagnosis present

## 2021-06-04 DIAGNOSIS — I5033 Acute on chronic diastolic (congestive) heart failure: Secondary | ICD-10-CM | POA: Diagnosis present

## 2021-06-04 DIAGNOSIS — R509 Fever, unspecified: Secondary | ICD-10-CM

## 2021-06-04 DIAGNOSIS — Z885 Allergy status to narcotic agent status: Secondary | ICD-10-CM

## 2021-06-04 DIAGNOSIS — J1282 Pneumonia due to coronavirus disease 2019: Secondary | ICD-10-CM | POA: Diagnosis present

## 2021-06-04 DIAGNOSIS — Z8673 Personal history of transient ischemic attack (TIA), and cerebral infarction without residual deficits: Secondary | ICD-10-CM

## 2021-06-04 DIAGNOSIS — N189 Chronic kidney disease, unspecified: Secondary | ICD-10-CM | POA: Diagnosis present

## 2021-06-04 DIAGNOSIS — Z87891 Personal history of nicotine dependence: Secondary | ICD-10-CM

## 2021-06-04 DIAGNOSIS — R001 Bradycardia, unspecified: Secondary | ICD-10-CM | POA: Diagnosis present

## 2021-06-04 DIAGNOSIS — I480 Paroxysmal atrial fibrillation: Secondary | ICD-10-CM | POA: Diagnosis present

## 2021-06-04 DIAGNOSIS — I132 Hypertensive heart and chronic kidney disease with heart failure and with stage 5 chronic kidney disease, or end stage renal disease: Secondary | ICD-10-CM | POA: Diagnosis not present

## 2021-06-04 DIAGNOSIS — Z9012 Acquired absence of left breast and nipple: Secondary | ICD-10-CM

## 2021-06-04 DIAGNOSIS — Z8249 Family history of ischemic heart disease and other diseases of the circulatory system: Secondary | ICD-10-CM

## 2021-06-04 DIAGNOSIS — Z800 Family history of malignant neoplasm of digestive organs: Secondary | ICD-10-CM

## 2021-06-04 DIAGNOSIS — J440 Chronic obstructive pulmonary disease with acute lower respiratory infection: Secondary | ICD-10-CM | POA: Diagnosis present

## 2021-06-04 DIAGNOSIS — I472 Ventricular tachycardia, unspecified: Secondary | ICD-10-CM | POA: Diagnosis not present

## 2021-06-04 DIAGNOSIS — R072 Precordial pain: Secondary | ICD-10-CM

## 2021-06-04 DIAGNOSIS — J9601 Acute respiratory failure with hypoxia: Secondary | ICD-10-CM | POA: Diagnosis present

## 2021-06-04 DIAGNOSIS — Z8619 Personal history of other infectious and parasitic diseases: Secondary | ICD-10-CM

## 2021-06-04 DIAGNOSIS — I959 Hypotension, unspecified: Secondary | ICD-10-CM | POA: Diagnosis not present

## 2021-06-04 DIAGNOSIS — Z862 Personal history of diseases of the blood and blood-forming organs and certain disorders involving the immune mechanism: Secondary | ICD-10-CM | POA: Diagnosis present

## 2021-06-04 DIAGNOSIS — Z91013 Allergy to seafood: Secondary | ICD-10-CM

## 2021-06-04 DIAGNOSIS — Z820 Family history of epilepsy and other diseases of the nervous system: Secondary | ICD-10-CM

## 2021-06-04 DIAGNOSIS — G9341 Metabolic encephalopathy: Secondary | ICD-10-CM | POA: Diagnosis present

## 2021-06-04 DIAGNOSIS — I700 Atherosclerosis of aorta: Secondary | ICD-10-CM | POA: Diagnosis present

## 2021-06-04 DIAGNOSIS — Z9114 Patient's other noncompliance with medication regimen: Secondary | ICD-10-CM

## 2021-06-04 DIAGNOSIS — Z79899 Other long term (current) drug therapy: Secondary | ICD-10-CM

## 2021-06-04 DIAGNOSIS — E875 Hyperkalemia: Secondary | ICD-10-CM | POA: Diagnosis not present

## 2021-06-04 DIAGNOSIS — Z853 Personal history of malignant neoplasm of breast: Secondary | ICD-10-CM

## 2021-06-04 DIAGNOSIS — Z888 Allergy status to other drugs, medicaments and biological substances status: Secondary | ICD-10-CM

## 2021-06-04 DIAGNOSIS — Z992 Dependence on renal dialysis: Secondary | ICD-10-CM

## 2021-06-04 DIAGNOSIS — Z7901 Long term (current) use of anticoagulants: Secondary | ICD-10-CM

## 2021-06-04 DIAGNOSIS — D649 Anemia, unspecified: Secondary | ICD-10-CM | POA: Diagnosis present

## 2021-06-04 LAB — RETICULOCYTES
Immature Retic Fract: 15.2 % (ref 2.3–15.9)
RBC.: 2.5 MIL/uL — ABNORMAL LOW (ref 3.87–5.11)
Retic Count, Absolute: 43.8 10*3/uL (ref 19.0–186.0)
Retic Ct Pct: 1.8 % (ref 0.4–3.1)

## 2021-06-04 LAB — COMPREHENSIVE METABOLIC PANEL
ALT: 19 U/L (ref 0–44)
AST: 31 U/L (ref 15–41)
Albumin: 3.4 g/dL — ABNORMAL LOW (ref 3.5–5.0)
Alkaline Phosphatase: 68 U/L (ref 38–126)
Anion gap: 15 (ref 5–15)
BUN: 72 mg/dL — ABNORMAL HIGH (ref 8–23)
CO2: 23 mmol/L (ref 22–32)
Calcium: 8.6 mg/dL — ABNORMAL LOW (ref 8.9–10.3)
Chloride: 101 mmol/L (ref 98–111)
Creatinine, Ser: 14.32 mg/dL — ABNORMAL HIGH (ref 0.44–1.00)
GFR, Estimated: 3 mL/min — ABNORMAL LOW (ref >60)
Glucose, Bld: 101 mg/dL — ABNORMAL HIGH (ref 70–99)
Potassium: 4.6 mmol/L (ref 3.5–5.1)
Sodium: 139 mmol/L (ref 135–145)
Total Bilirubin: 0.8 mg/dL (ref 0.3–1.2)
Total Protein: 6.4 g/dL — ABNORMAL LOW (ref 6.5–8.1)

## 2021-06-04 LAB — CBC WITH DIFFERENTIAL/PLATELET
Abs Immature Granulocytes: 0.02 10*3/uL (ref 0.00–0.07)
Basophils Absolute: 0 10*3/uL (ref 0.0–0.1)
Basophils Relative: 1 %
Eosinophils Absolute: 0.1 10*3/uL (ref 0.0–0.5)
Eosinophils Relative: 2 %
HCT: 24.6 % — ABNORMAL LOW (ref 36.0–46.0)
Hemoglobin: 7.7 g/dL — ABNORMAL LOW (ref 12.0–15.0)
Immature Granulocytes: 0 %
Lymphocytes Relative: 12 %
Lymphs Abs: 0.5 10*3/uL — ABNORMAL LOW (ref 0.7–4.0)
MCH: 29.4 pg (ref 26.0–34.0)
MCHC: 31.3 g/dL (ref 30.0–36.0)
MCV: 93.9 fL (ref 80.0–100.0)
Monocytes Absolute: 0.4 10*3/uL (ref 0.1–1.0)
Monocytes Relative: 9 %
Neutro Abs: 3.4 10*3/uL (ref 1.7–7.7)
Neutrophils Relative %: 76 %
Platelets: 255 10*3/uL (ref 150–400)
RBC: 2.62 MIL/uL — ABNORMAL LOW (ref 3.87–5.11)
RDW: 19.9 % — ABNORMAL HIGH (ref 11.5–15.5)
WBC: 4.6 10*3/uL (ref 4.0–10.5)
nRBC: 0 % (ref 0.0–0.2)

## 2021-06-04 LAB — LIPASE, BLOOD: Lipase: 47 U/L (ref 11–51)

## 2021-06-04 LAB — POC OCCULT BLOOD, ED: Fecal Occult Bld: NEGATIVE

## 2021-06-04 LAB — TYPE AND SCREEN
ABO/RH(D): O POS
Antibody Screen: NEGATIVE

## 2021-06-04 LAB — TROPONIN I (HIGH SENSITIVITY): Troponin I (High Sensitivity): 158 ng/L (ref <18)

## 2021-06-04 MED ORDER — FENTANYL CITRATE PF 50 MCG/ML IJ SOSY
50.0000 ug | PREFILLED_SYRINGE | Freq: Once | INTRAMUSCULAR | Status: AC
  Administered 2021-06-04: 50 ug via INTRAVENOUS
  Filled 2021-06-04 (×2): qty 1

## 2021-06-04 NOTE — ED Triage Notes (Signed)
Pt states started this morning, chills, short of breath, productive cough, dialysis pt, tues, thurs sat.  102 per ems. Sat 81%rm air, 98%4L, not previously oxygen dependent.  B/p 220/130, HR 90, given 1000mg  tylenol PTA by ems.  Refused IV access enroute.

## 2021-06-04 NOTE — ED Provider Notes (Signed)
Emergency Department Provider Note   I have reviewed the triage vital signs and the nursing notes.   HISTORY  Chief Complaint Shortness of Breath (fever)   HPI Monique Henderson is a 71 y.o. female with past medical history reviewed below presents emergency department for evaluation of shortness of breath with chest pain.  Symptoms radiate to the back.  She states this feels similar to when she misses dialysis.  She states that in that setting, she often gets this similar pain along with shortness of breath.  She missed dialysis on Tuesday and was last dialyzed fully on Saturday.  She is not having any fevers.  She does not normally require oxygen.  Patient is of some chills with productive cough.  EMS found her to be satting 81% on room air and transitioned her to 4 L nasal cannula.  Blood pressure was very high. Reportedly had a fever on EMS arrival.    Past Medical History:  Diagnosis Date   AF (paroxysmal atrial fibrillation) ([place]) 05/29/2019   on Coumadin   Aortic atherosclerosis ([place]) 07/05/2019   Aortic dissection ([place]) 04/04/2019   s/p repair   Bone spur 2008   Right calcaneal foot spur   Breast cancer ([place]) 2004   Ductal carcinoma in situ of the left breast; S/P left partial mastectomy 02/26/2003; S/P re-excision of cranial and lateral margins11/18/2004.radiation   Cerebral thrombosis with cerebral infarction 05/22/2019   Chronic low back pain 06/22/2016   Chronic obstructive lung disease ([place]) 01/16/2017   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 Autumn Messing; S/P re-excision of superior and inferior margins 10/27/2012.    ESRD on dialysis ([place]) 05/29/2019   Essential hypertension 09/16/2006   GERD 09/16/2006   Hepatitis C    treated and RNA confirmed not detectable 01/2017   Insomnia 03/14/2015   Malnutrition of moderate degree 05/19/2019   Non compliance with medical treatment 12/04/2017   Normocytic anemia     With thrombocytosis   Osteoarthritis    Right ureteral stone 2002   S/P lumbar spinal fusion 01/18/2014   S/P lumbar decompressive laminectomy, fusion, and plating for lumbar spinal stenosis on 05/27/2009 by Dr. Eustace Moore.  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 Eustace Moore.     Tobacco use disorder 04/19/2009   Uterine fibroid    Wears dentures    top    Review of Systems  Constitutional: Positive fever/chills Eyes: No visual changes. ENT: No sore throat. Cardiovascular: Positive chest pain. Respiratory: Positive shortness of breath and cough.  Gastrointestinal: No abdominal pain.  No nausea, no vomiting.  No diarrhea.  No constipation. Genitourinary: Negative for dysuria. Musculoskeletal: Negative for back pain. Skin: Negative for rash. Neurological: Negative for headaches, focal weakness or numbness.  10-point ROS otherwise negative.  ____________________________________________   PHYSICAL EXAM:  VITAL SIGNS: ED Triage Vitals [06/04/21 1834]  Enc Vitals Group     BP (!) 200/109     Pulse Rate 89     Resp (!) 22     Temp      Temp src      SpO2 99 %   Constitutional: Alert and oriented. Well appearing and in no acute distress. Eyes: Conjunctivae are normal.  Head: Atraumatic. Nose: No congestion/rhinnorhea. Mouth/Throat: Mucous membranes  are moist.   Neck: No stridor.  Cardiovascular: Normal rate, regular rhythm. Good peripheral circulation. Grossly normal heart sounds.   Respiratory: Normal respiratory effort. No retractions. Lungs CTAB. Gastrointestinal: Soft and nontender. No distention.  Rectal exam performed with nurse chaperone after patient's verbal consent.  Brown stool.  No gross blood or melena.  Musculoskeletal: No lower extremity tenderness nor  edema. No gross deformities of extremities. Neurologic:  Normal speech and language. No gross focal neurologic deficits are appreciated.  Skin:  Skin is warm, dry and intact. No rash noted.  ____________________________________________   LABS (all labs ordered are listed, but only abnormal results are displayed)  Labs Reviewed  COMPREHENSIVE METABOLIC PANEL - Abnormal; Notable for the following components:      Result Value   Glucose, Bld 101 (*)    BUN 72 (*)    Creatinine, Ser 14.32 (*)    Calcium 8.6 (*)    Total Protein 6.4 (*)    Albumin 3.4 (*)    GFR, Estimated 3 (*)    All other components within normal limits  CBC WITH DIFFERENTIAL/PLATELET - Abnormal; Notable for the following components:   RBC 2.62 (*)    Hemoglobin 7.7 (*)    HCT 24.6 (*)    RDW 19.9 (*)    Lymphs Abs 0.5 (*)    All other components within normal limits  RETICULOCYTES - Abnormal; Notable for the following components:   RBC. 2.50 (*)    All other components within normal limits  TROPONIN I (HIGH SENSITIVITY) - Abnormal; Notable for the following components:   Troponin I (High Sensitivity) 158 (*)    All other components within normal limits  RESP PANEL BY RT-PCR (FLU A&B, COVID) ARPGX2  LIPASE, BLOOD  VITAMIN B12  FOLATE  IRON AND TIBC  FERRITIN  POC OCCULT BLOOD, ED  TYPE AND SCREEN  TROPONIN I (HIGH SENSITIVITY)   ____________________________________________  EKG   EKG Interpretation  Date/Time:  Wednesday June 04 2021 18:35:01 EST Ventricular Rate:  89 PR Interval:  161 QRS Duration: 83 QT Interval:  381 QTC Calculation: 464 R Axis:   -46 Text Interpretation: Sinus rhythm Biatrial enlargement Left anterior fascicular block Abnormal R-wave progression, early transition Probable left ventricular hypertrophy Abnrm T, consider ischemia, anterolateral lds  Nonspecific ST changes similar to prior Confirmed by Nanda Quinton [phone]) on 06/04/2021 10:25:07 PM         ____________________________________________  RADIOLOGY  DG Chest Portable 1 View  Result Date: 06/04/2021 CLINICAL DATA:  Chest pain EXAM: PORTABLE CHEST 1 VIEW COMPARISON:  02/11/2021 FINDINGS: Diffuse interstitial pulmonary infiltrate is present most in keeping with mild interstitial pulmonary edema, likely cardiogenic in nature. The lungs are symmetrically well expanded. No pneumothorax or pleural effusion. Cardiac size is mildly enlarged, unchanged. Right internal jugular hemodialysis catheter tips are seen within the right atrium. Surgical clips overlie the right axilla and right lung base. No acute bone abnormality. IMPRESSION: Stable cardiomegaly.  Mild cardiogenic failure. Electronically Signed   By: Fidela Salisbury M.D.   On: 06/04/2021 19:18    ____________________________________________   PROCEDURES  Procedure(s) performed:   Procedures  CRITICAL CARE Performed by: Margette Fast Total critical care time: 35 minutes Critical care time was exclusive of separately billable procedures and treating other patients. Critical care was necessary to treat or prevent imminent or life-threatening deterioration. Critical care was time spent personally by me on the following activities: development of treatment plan with patient and/or surrogate as well as nursing,  discussions with consultants, evaluation of patient's response to treatment, examination of patient, obtaining history from patient or surrogate, ordering and performing treatments and interventions, ordering and review of laboratory studies, ordering and review of radiographic studies, pulse oximetry and re-evaluation of patient's condition.  Nanda Quinton, MD Emergency Medicine  ____________________________________________   INITIAL IMPRESSION / ASSESSMENT AND PLAN / ED COURSE  Pertinent labs & imaging results that were available during my care of the patient were reviewed by me and considered in my medical decision  making (see chart for details).   This patient is Presenting for Evaluation of SOB and fever with cough and CP, which does require a range of treatment options, and is a complaint that involves a high risk of morbidity and mortality.  The Differential Diagnoses include CAP, pulmonary edema, HTN emergency, ACS, PE, COVID/Flu.  Medical Decision Making: Summary:  Patient arrives emergency department with hypoxemia and shortness of breath in setting of missing dialysis.  Her blood pressures are elevated.  Pain in the chest feels similar to prior episodes where she has missed dialysis.    Reevaluation with update and discussion with     Critical Interventions- supplemental O2 and pain control; to evaluate  Chief Complaint  Patient presents with   Shortness of Breath    fever    and assess for illness characterized as    After These Interventions, the Patient was reevaluated and was found patient remains on supplemental oxygen but looks much more comfortable after pain medication.  She is no longer having chest or back pain.  She is resting comfortably with normal mental status.   Consult complete with the Hospitalist.   Discussed patient's case with [facility], Dr. Hal Hope to request admission. Patient and family (if present) updated with plan. Care transferred to [facility] service.  I reviewed all nursing notes, vitals, pertinent old records, EKGs, labs, imaging (as available).        I decided to review pertinent External Data, and in summary patient has had prior ED evaluations for chest pain and shortness of breath.  Most recently evaluated in September 2022.  Does have history of aortic dissection status postrepair but notes that pain today feels similar to prior times when she has missed dialysis similar to her July admit.    Clinical Laboratory Tests Ordered, included troponin elevated to 158.  Patient with end-stage renal disease and has chronically elevated troponins, higher than this  in the past.  No leukocytosis.  Negative lipase.  Fecal occult negative with new anemia noted here.  Have added on anemia panel labs and type and screen in case blood transfusion becomes required.  Radiologic Tests Ordered, included chest x-ray which I independently reviewed showing stable cardiomegaly. No infiltrate.   Cardiac Monitor Tracing which shows NSR   Social Determinants of Health Risk include intermittent hemodialysis noncompliance.    ____________________________________________  FINAL CLINICAL IMPRESSION(S) / ED DIAGNOSES  Final diagnoses:  Acute respiratory failure with hypoxia (HCC)  Precordial chest pain    MEDICATIONS GIVEN DURING THIS VISIT:  Medications  fentaNYL (SUBLIMAZE) injection 50 mcg (50 mcg Intravenous Given 06/04/21 2115)    Note:  This document was prepared using Dragon voice recognition software and may include unintentional dictation errors.  Nanda Quinton, MD, [facility] Emergency Medicine    Diya Gervasi, [facility], MD 06/04/21 [phone]

## 2021-06-04 NOTE — ED Notes (Signed)
Pt SPO2 was 85% on room air. NT placed pt back on 4L O2 and SPO2 came back up to 99%. RN notified.

## 2021-06-05 ENCOUNTER — Encounter (HOSPITAL_COMMUNITY): Payer: Self-pay | Admitting: Internal Medicine

## 2021-06-05 ENCOUNTER — Observation Stay (HOSPITAL_COMMUNITY): Payer: Medicare Other

## 2021-06-05 DIAGNOSIS — I480 Paroxysmal atrial fibrillation: Secondary | ICD-10-CM | POA: Diagnosis present

## 2021-06-05 DIAGNOSIS — U071 COVID-19: Secondary | ICD-10-CM | POA: Diagnosis present

## 2021-06-05 DIAGNOSIS — Z7901 Long term (current) use of anticoagulants: Secondary | ICD-10-CM | POA: Diagnosis not present

## 2021-06-05 DIAGNOSIS — Z862 Personal history of diseases of the blood and blood-forming organs and certain disorders involving the immune mechanism: Secondary | ICD-10-CM | POA: Diagnosis present

## 2021-06-05 DIAGNOSIS — I5033 Acute on chronic diastolic (congestive) heart failure: Secondary | ICD-10-CM | POA: Diagnosis present

## 2021-06-05 DIAGNOSIS — Z992 Dependence on renal dialysis: Secondary | ICD-10-CM | POA: Diagnosis not present

## 2021-06-05 DIAGNOSIS — Z9114 Patient's other noncompliance with medication regimen: Secondary | ICD-10-CM | POA: Diagnosis not present

## 2021-06-05 DIAGNOSIS — Z8673 Personal history of transient ischemic attack (TIA), and cerebral infarction without residual deficits: Secondary | ICD-10-CM | POA: Diagnosis not present

## 2021-06-05 DIAGNOSIS — R001 Bradycardia, unspecified: Secondary | ICD-10-CM | POA: Diagnosis present

## 2021-06-05 DIAGNOSIS — I959 Hypotension, unspecified: Secondary | ICD-10-CM | POA: Diagnosis not present

## 2021-06-05 DIAGNOSIS — I700 Atherosclerosis of aorta: Secondary | ICD-10-CM | POA: Diagnosis present

## 2021-06-05 DIAGNOSIS — I160 Hypertensive urgency: Secondary | ICD-10-CM | POA: Diagnosis present

## 2021-06-05 DIAGNOSIS — I132 Hypertensive heart and chronic kidney disease with heart failure and with stage 5 chronic kidney disease, or end stage renal disease: Secondary | ICD-10-CM | POA: Diagnosis present

## 2021-06-05 DIAGNOSIS — N186 End stage renal disease: Secondary | ICD-10-CM | POA: Diagnosis present

## 2021-06-05 DIAGNOSIS — J810 Acute pulmonary edema: Secondary | ICD-10-CM | POA: Diagnosis not present

## 2021-06-05 DIAGNOSIS — Z9012 Acquired absence of left breast and nipple: Secondary | ICD-10-CM | POA: Diagnosis not present

## 2021-06-05 DIAGNOSIS — J440 Chronic obstructive pulmonary disease with acute lower respiratory infection: Secondary | ICD-10-CM | POA: Diagnosis present

## 2021-06-05 DIAGNOSIS — D631 Anemia in chronic kidney disease: Secondary | ICD-10-CM | POA: Diagnosis present

## 2021-06-05 DIAGNOSIS — G9341 Metabolic encephalopathy: Secondary | ICD-10-CM | POA: Diagnosis present

## 2021-06-05 DIAGNOSIS — Z981 Arthrodesis status: Secondary | ICD-10-CM | POA: Diagnosis not present

## 2021-06-05 DIAGNOSIS — E785 Hyperlipidemia, unspecified: Secondary | ICD-10-CM | POA: Diagnosis present

## 2021-06-05 DIAGNOSIS — J1282 Pneumonia due to coronavirus disease 2019: Secondary | ICD-10-CM | POA: Diagnosis present

## 2021-06-05 DIAGNOSIS — N189 Chronic kidney disease, unspecified: Secondary | ICD-10-CM | POA: Diagnosis present

## 2021-06-05 DIAGNOSIS — I472 Ventricular tachycardia, unspecified: Secondary | ICD-10-CM | POA: Diagnosis not present

## 2021-06-05 DIAGNOSIS — J9601 Acute respiratory failure with hypoxia: Secondary | ICD-10-CM | POA: Diagnosis present

## 2021-06-05 DIAGNOSIS — R072 Precordial pain: Secondary | ICD-10-CM | POA: Diagnosis present

## 2021-06-05 DIAGNOSIS — D649 Anemia, unspecified: Secondary | ICD-10-CM | POA: Diagnosis present

## 2021-06-05 DIAGNOSIS — Z8619 Personal history of other infectious and parasitic diseases: Secondary | ICD-10-CM | POA: Diagnosis not present

## 2021-06-05 LAB — TROPONIN I (HIGH SENSITIVITY): Troponin I (High Sensitivity): 186 ng/L (ref <18)

## 2021-06-05 LAB — BASIC METABOLIC PANEL
Anion gap: 15 (ref 5–15)
BUN: 77 mg/dL — ABNORMAL HIGH (ref 8–23)
CO2: 23 mmol/L (ref 22–32)
Calcium: 8.3 mg/dL — ABNORMAL LOW (ref 8.9–10.3)
Chloride: 99 mmol/L (ref 98–111)
Creatinine, Ser: 15.26 mg/dL — ABNORMAL HIGH (ref 0.44–1.00)
GFR, Estimated: 2 mL/min — ABNORMAL LOW (ref >60)
Glucose, Bld: 105 mg/dL — ABNORMAL HIGH (ref 70–99)
Potassium: 5.4 mmol/L — ABNORMAL HIGH (ref 3.5–5.1)
Sodium: 137 mmol/L (ref 135–145)

## 2021-06-05 LAB — IRON AND TIBC
Iron: 32 ug/dL (ref 28–170)
Saturation Ratios: 13 % (ref 10.4–31.8)
TIBC: 245 ug/dL — ABNORMAL LOW (ref 250–450)
UIBC: 213 ug/dL

## 2021-06-05 LAB — PROTIME-INR
INR: 1.3 — ABNORMAL HIGH (ref 0.8–1.2)
Prothrombin Time: 16.4 seconds — ABNORMAL HIGH (ref 11.4–15.2)

## 2021-06-05 LAB — C-REACTIVE PROTEIN: CRP: 1.3 mg/dL — ABNORMAL HIGH (ref <1.0)

## 2021-06-05 LAB — D-DIMER, QUANTITATIVE: D-Dimer, Quant: 0.94 ug/mL-FEU — ABNORMAL HIGH (ref 0.00–0.50)

## 2021-06-05 LAB — RESP PANEL BY RT-PCR (FLU A&B, COVID) ARPGX2
Influenza A by PCR: NEGATIVE
Influenza B by PCR: NEGATIVE
SARS Coronavirus 2 by RT PCR: POSITIVE — AB

## 2021-06-05 LAB — PROCALCITONIN: Procalcitonin: 2.17 ng/mL

## 2021-06-05 LAB — FERRITIN: Ferritin: 1454 ng/mL — ABNORMAL HIGH (ref 11–307)

## 2021-06-05 LAB — VITAMIN B12: Vitamin B-12: 530 pg/mL (ref 180–914)

## 2021-06-05 LAB — FOLATE: Folate: 10.5 ng/mL (ref >5.9)

## 2021-06-05 MED ORDER — LABETALOL HCL 5 MG/ML IV SOLN: 10.0000 mg | INTRAVENOUS | Status: DC | PRN

## 2021-06-05 MED ORDER — SODIUM CHLORIDE 0.9 % IV SOLN
100.0000 mg | Freq: Every day | INTRAVENOUS | Status: AC
  Administered 2021-06-06 – 2021-06-09 (×4): 100 mg via INTRAVENOUS
  Filled 2021-06-05 (×5): qty 20

## 2021-06-05 MED ORDER — MIRTAZAPINE 15 MG PO TABS
7.5000 mg | ORAL_TABLET | Freq: Every evening | ORAL | Status: DC | PRN
  Administered 2021-06-06: 7.5 mg via ORAL
  Filled 2021-06-05 (×2): qty 1

## 2021-06-05 MED ORDER — IOHEXOL 350 MG/ML SOLN
75.0000 mL | Freq: Once | INTRAVENOUS | Status: AC | PRN
  Administered 2021-06-05: 75 mL via INTRAVENOUS

## 2021-06-05 MED ORDER — SODIUM CHLORIDE 0.9 % IV SOLN
200.0000 mg | Freq: Once | INTRAVENOUS | Status: AC
  Administered 2021-06-05: 200 mg via INTRAVENOUS
  Filled 2021-06-05: qty 40

## 2021-06-05 MED ORDER — WARFARIN - PHARMACIST DOSING INPATIENT: Freq: Every day | Status: DC

## 2021-06-05 MED ORDER — AMLODIPINE BESYLATE 10 MG PO TABS
10.0000 mg | ORAL_TABLET | Freq: Every day | ORAL | Status: DC
  Administered 2021-06-05 – 2021-06-09 (×4): 10 mg via ORAL
  Filled 2021-06-05 (×2): qty 2
  Filled 2021-06-05 (×2): qty 1

## 2021-06-05 MED ORDER — HYDRALAZINE HCL 50 MG PO TABS
50.0000 mg | ORAL_TABLET | Freq: Three times a day (TID) | ORAL | Status: DC
  Administered 2021-06-05 – 2021-06-09 (×9): 50 mg via ORAL
  Filled 2021-06-05 (×8): qty 1
  Filled 2021-06-05: qty 2
  Filled 2021-06-05 (×3): qty 1

## 2021-06-05 MED ORDER — WARFARIN SODIUM 7.5 MG PO TABS
7.5000 mg | ORAL_TABLET | Freq: Once | ORAL | Status: AC
  Administered 2021-06-05: 7.5 mg via ORAL
  Filled 2021-06-05: qty 1

## 2021-06-05 MED ORDER — ROSUVASTATIN CALCIUM 5 MG PO TABS
10.0000 mg | ORAL_TABLET | Freq: Every day | ORAL | Status: DC
  Administered 2021-06-05 – 2021-06-09 (×4): 10 mg via ORAL
  Filled 2021-06-05 (×4): qty 2

## 2021-06-05 MED ORDER — CLONIDINE HCL 0.3 MG/24HR TD PTWK
0.3000 mg | MEDICATED_PATCH | TRANSDERMAL | Status: DC
  Administered 2021-06-05 – 2021-06-07 (×2): 0.3 mg via TRANSDERMAL
  Filled 2021-06-05 (×2): qty 1

## 2021-06-05 MED ORDER — ACETAMINOPHEN 325 MG PO TABS
650.0000 mg | ORAL_TABLET | Freq: Four times a day (QID) | ORAL | Status: DC | PRN
  Administered 2021-06-05 – 2021-06-06 (×2): 650 mg via ORAL
  Filled 2021-06-05 (×2): qty 2

## 2021-06-05 MED ORDER — CHLORHEXIDINE GLUCONATE CLOTH 2 % EX PADS
6.0000 | MEDICATED_PAD | Freq: Every day | CUTANEOUS | Status: DC
  Administered 2021-06-06: 6 via TOPICAL

## 2021-06-05 MED ORDER — ALBUTEROL SULFATE (2.5 MG/3ML) 0.083% IN NEBU: 2.5000 mg | INHALATION_SOLUTION | Freq: Four times a day (QID) | RESPIRATORY_TRACT | Status: DC | PRN

## 2021-06-05 MED ORDER — SODIUM ZIRCONIUM CYCLOSILICATE 10 G PO PACK
10.0000 g | PACK | Freq: Once | ORAL | Status: AC
Start: 2021-06-05 — End: 2021-06-05
  Administered 2021-06-05: 10 g via ORAL
  Filled 2021-06-05: qty 1

## 2021-06-05 MED ORDER — SODIUM ZIRCONIUM CYCLOSILICATE 10 G PO PACK
10.0000 g | PACK | Freq: Once | ORAL | Status: AC
  Administered 2021-06-05: 10 g via ORAL
  Filled 2021-06-05: qty 1

## 2021-06-05 MED ORDER — PREGABALIN 100 MG PO CAPS
100.0000 mg | ORAL_CAPSULE | Freq: Every day | ORAL | Status: DC
  Administered 2021-06-05 – 2021-06-06 (×3): 100 mg via ORAL
  Filled 2021-06-05 (×4): qty 1

## 2021-06-05 MED ORDER — LOSARTAN POTASSIUM 50 MG PO TABS
100.0000 mg | ORAL_TABLET | Freq: Every day | ORAL | Status: DC
  Administered 2021-06-05 – 2021-06-09 (×4): 100 mg via ORAL
  Filled 2021-06-05 (×4): qty 2

## 2021-06-05 MED ORDER — METOPROLOL TARTRATE 50 MG PO TABS
75.0000 mg | ORAL_TABLET | Freq: Two times a day (BID) | ORAL | Status: DC
  Administered 2021-06-05 – 2021-06-07 (×6): 75 mg via ORAL
  Filled 2021-06-05 (×2): qty 1
  Filled 2021-06-05 (×3): qty 3
  Filled 2021-06-05: qty 1
  Filled 2021-06-05: qty 3

## 2021-06-05 MED ORDER — DARBEPOETIN ALFA 150 MCG/0.3ML IJ SOSY: 150.0000 ug | PREFILLED_SYRINGE | INTRAMUSCULAR | Status: DC

## 2021-06-05 MED ORDER — TORSEMIDE 20 MG PO TABS
40.0000 mg | ORAL_TABLET | Freq: Every day | ORAL | Status: DC
Start: 2021-06-05 — End: 2021-06-09
  Administered 2021-06-05 – 2021-06-09 (×3): 40 mg via ORAL
  Filled 2021-06-05 (×6): qty 2

## 2021-06-05 NOTE — ED Notes (Signed)
Pt c/o 10/10 back pain, and 8/10 generalized chest pain in the center of her chest. RN did a new EKG. Paged admitting MD.

## 2021-06-05 NOTE — ED Notes (Signed)
Nephrology at bedside

## 2021-06-05 NOTE — ED Notes (Signed)
Patient transported to CT 

## 2021-06-05 NOTE — H&P (Addendum)
History and Physical    Monique Henderson QMG:[id number] DOB: 04/27/1951 DOA: 06/04/2021  PCP: System, Provider Not In  Patient coming from: Home.  Chief Complaint: Shortness of breath.  Chest pain.  HPI: Monique Henderson is a 71 y.o. female with history of ESRD on hemodialysis on Tuesday Thursday Saturday presents to the ER with complaints of shortness of breath and chest pain ongoing for the last 24 hours.  Patient had missed her dialysis yesterday since patient was not feeling well.  Last dialysis was on Saturday about 4 days ago.  Patient also has been having some pleuritic type chest pain on deep inspiration.  Has not taken her medications for last few days.  ED Course: In the ER patient was short of breath and was requiring 4 L oxygen to maintain sats.  Chest x-ray shows congestion.  EKG shows normal sinus rhythm.  High sensitive troponin of 158.  Hemoglobin 7.7 stool for occult blood was negative.  Patient admitted for further management of pulmonary edema from missing dialysis hypertensive urgency.  COVID test is pending.  Review of Systems: As per HPI, rest all negative.   Past Medical History:  Diagnosis Date   AF (paroxysmal atrial fibrillation) ([place]) 05/29/2019   on Coumadin   Aortic atherosclerosis ([place]) 07/05/2019   Aortic dissection ([place]) 04/04/2019   s/p repair   Bone spur 2008   Right calcaneal foot spur   Breast cancer ([place]) 2004   Ductal carcinoma in situ of the left breast; S/P left partial mastectomy 02/26/2003; S/P re-excision of cranial and lateral margins11/18/2004.radiation   Cerebral thrombosis with cerebral infarction 05/22/2019   Chronic low back pain 06/22/2016   Chronic obstructive lung disease ([place]) 01/16/2017   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 Autumn Messing; S/P re-excision of superior and inferior margins 10/27/2012.    ESRD on dialysis ([place]) 05/29/2019   Essential hypertension 09/16/2006    GERD 09/16/2006   Hepatitis C    treated and RNA confirmed not detectable 01/2017   Insomnia 03/14/2015   Malnutrition of moderate degree 05/19/2019   Non compliance with medical treatment 12/04/2017   Normocytic anemia    With thrombocytosis   Osteoarthritis    Right ureteral stone 2002   S/P lumbar spinal fusion 01/18/2014   S/P lumbar decompressive laminectomy, fusion, and plating for lumbar spinal stenosis on 05/27/2009 by Dr. Eustace Moore.  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 Eustace Moore.     Tobacco use disorder 04/19/2009   Uterine fibroid    Wears dentures    top    Past Surgical History:  Procedure Laterality Date   ANTERIOR LAT LUMBAR FUSION N/A 01/18/2014   Procedure: ANTERIOR LATERAL LUMBAR FUSION LUMBAR TWO-THREE;  Surgeon: Eustace Moore, MD;  Location: [facility];  Service: Neurosurgery;  Laterality: N/A;   ANTERIOR LUMBAR FUSION  01/18/2014   AV FISTULA PLACEMENT Left 04/20/2019   Procedure: ARTERIOVENOUS (AV) FISTULA CREATION;  Surgeon: [name], MD;  Location: [facility];  Service: Vascular;  Laterality: Left;   BACK SURGERY     BREAST LUMPECTOMY Left 01/2003   BREAST LUMPECTOMY Right 2014   BREAST LUMPECTOMY WITH NEEDLE LOCALIZATION AND AXILLARY SENTINEL LYMPH NODE BX Right 10/13/2012   Procedure: BREAST LUMPECTOMY WITH NEEDLE  LOCALIZATION;  Surgeon: [name], MD;  Location: [facility];  Service: General;  Laterality: Right;  Right breast wire localized lumpectomy   INSERTION OF DIALYSIS CATHETER Right 04/20/2019   Procedure: INSERTION OF DIALYSIS CATHETER, right internal jugular;  Surgeon: [name], MD;  Location: [facility];  Service: Vascular;  Laterality: Right;   INSERTION OF DIALYSIS CATHETER Right 09/24/2020    Procedure: INSERTION OF TUNNELED DIALYSIS CATHETER;  Surgeon: Rosetta Posner, MD;  Location: [facility];  Service: Vascular;  Laterality: Right;   IR FLUORO GUIDE CV LINE RIGHT  09/22/2020   IR THORACENTESIS ASP PLEURAL SPACE W/IMG GUIDE  05/19/2019   IR US GUIDE VASC ACCESS LEFT  09/22/2020   IR US GUIDE VASC ACCESS RIGHT  09/22/2020   IR VENOCAVAGRAM SVC  09/22/2020   LAMINECTOMY  05/27/2009   Lumbar decompressive laminectomy, fusion and plating for lumbar spinal stensosis   LIGATION OF ARTERIOVENOUS  FISTULA Left 09/24/2020   Procedure: LIGATION OF LEFT ARM ARTERIOVENOUS  FISTULA;  Surgeon: Rosetta Posner, MD;  Location: [facility];  Service: Vascular;  Laterality: Left;   LUMBAR LAMINECTOMY/DECOMPRESSION MICRODISCECTOMY Left 03/23/2013   Procedure: LUMBAR LAMINECTOMY/DECOMPRESSION MICRODISCECTOMY 1 LEVEL;  Surgeon: Eustace Moore, MD;  Location: [facility];  Service: Neurosurgery;  Laterality: Left;  LUMBAR LAMINECTOMY/DECOMPRESSION MICRODISCECTOMY 1 LEVEL   MASTECTOMY, PARTIAL Left 02/26/2003   ; S/P re-excision of cranial and lateral margins 04/19/2003.    RE-EXCISION OF BREAST CANCER,SUPERIOR MARGINS Right 10/27/2012   Procedure: RE-EXCISION OF BREAST CANCER,SUPERIOR and inferior MARGINS;  Surgeon: [name], MD;  Location: [facility];  Service: General;  Laterality: Right;   RE-EXCISION OF BREAST LUMPECTOMY Left 04/2003   TEE WITHOUT CARDIOVERSION N/A 04/04/2019   Procedure: Transesophageal Echocardiogram (Tee);  Surgeon: Wonda Olds, MD;  Location: [facility];  Service: Open Heart Surgery;  Laterality: N/A;   THORACIC AORTIC ANEURYSM REPAIR N/A 04/04/2019   Procedure: THORACIC ASCENDING ANEURYSM REPAIR (AAA)  USING 28 MM X 30 CM HEMASHIELD PLATINUM VASCULAR GRAFT;  Surgeon: Wonda Olds, MD;  Location: [facility];  Service: Open Heart Surgery;  Laterality: N/A;     reports that she quit smoking about 2 years ago. Her smoking use included cigarettes. She has a 11.00 pack-year smoking history. She has  never used smokeless tobacco. She reports that she does not currently use alcohol after a past usage of about 2.0 standard drinks per week. She reports that she does not currently use drugs after having used the following drugs: Cocaine.  Allergies  Allergen Reactions   Shrimp [Shellfish Allergy] Shortness Of Breath   Bactroban [Mupirocin] Other (See Comments)    "Sores in nose"   Vicodin [Hydrocodone-Acetaminophen] Itching and Nausea And Vomiting    This is patient's home medication   Eliquis [Apixaban] Rash   Lisinopril Cough   Tylenol [Acetaminophen] Itching    Family History  Problem Relation Age of Onset   Colon cancer Mother 11   Hypertension Mother    Diabetes Sister 55   Hypertension Sister    Diabetes Brother    Hypertension Brother    Diabetes Brother    Hypertension Brother    Kidney disease Son        s/p renal transplant   Hypertension Son    Diabetes Son    Multiple sclerosis Son    Bone cancer Sister 63   Breast cancer Neg Hx    Cervical cancer Neg Hx  Prior to Admission medications   Medication Sig Start Date End Date Taking? Authorizing Provider  acetaminophen (TYLENOL) 325 MG tablet Take 650 mg by mouth every 6 (six) hours as needed for mild pain.   Yes [provider]  albuterol (VENTOLIN HFA) 108 (90 Base) MCG/ACT inhaler Inhale 2 puffs into the lungs every 6 (six) hours as needed for wheezing or shortness of breath. 07/30/20  Yes Maximiano Coss, NP  amLODipine (NORVASC) 10 MG tablet Take 1 tablet (10 mg total) by mouth daily. 02/14/21  Yes Maximiano Coss, NP  cloNIDine (CATAPRES - DOSED IN MG/24 HR) 0.3 mg/24hr patch Place 1 patch (0.3 mg total) onto the skin every Saturday. 02/15/21  Yes Maximiano Coss, NP  cyclobenzaprine (FLEXERIL) 5 MG tablet Take 5 mg by mouth 2 (two) times daily as needed for muscle spasms. 09/18/20  Yes [provider]  hydrALAZINE (APRESOLINE) 50 MG tablet Take 1 tablet (50 mg total) by mouth 3 (three) times  daily. Patient taking differently: Take 50 mg by mouth in the morning and at bedtime. 02/14/21  Yes Maximiano Coss, NP  loperamide (IMODIUM) 2 MG capsule Take 2 mg by mouth as needed for diarrhea or loose stools. 07/15/20  Yes [provider]  losartan (COZAAR) 100 MG tablet Take 1 tablet (100 mg total) by mouth daily. 02/14/21  Yes Maximiano Coss, NP  metoprolol tartrate (LOPRESSOR) 25 MG tablet Take 3 tablets (75 mg total) by mouth 2 (two) times daily. 02/14/21  Yes Maximiano Coss, NP  mirtazapine (REMERON) 7.5 MG tablet Take 1 tablet (7.5 mg total) by mouth at bedtime. Patient taking differently: Take 7.5 mg by mouth at bedtime as needed (sleep). 07/15/20  Yes Maximiano Coss, NP  [facility] 4 MG/0.1ML LIQD nasal spray kit Place 1 spray into the nose as needed for opioid reversal. If no response within 3 minutes then repeat once in the other nostril.  Call 911. 05/15/20  Yes [provider]  ondansetron (ZOFRAN) 4 MG tablet Take 1 tablet (4 mg total) by mouth every 8 (eight) hours as needed for nausea or vomiting. 08/03/19  Yes Jacelyn Pi, Lilia Argue, MD  pregabalin (LYRICA) 50 MG capsule Take 100 mg by mouth at bedtime. 05/07/21  Yes [provider]  rosuvastatin (CRESTOR) 10 MG tablet Take 1 tablet (10 mg total) by mouth daily. 02/14/21 06/05/21 Yes Maximiano Coss, NP  torsemide (DEMADEX) 20 MG tablet Take 40 mg by mouth daily. 05/19/21  Yes [provider]  VELPHORO 500 MG chewable tablet Chew 1,000 mg by mouth 3 (three) times daily. 05/19/21  Yes [provider]  VITAMIN D PO Take 1 tablet by mouth daily.   Yes [provider]  warfarin (COUMADIN) 6 MG tablet Take 1 tablet (6 mg total) by mouth 4 (four) times a week. Alternates with 7.57m tablet 12/24/20  Yes Vann, Jessica U, DO  warfarin (COUMADIN) 7.5 MG tablet Take 1 tablet (7.5 mg total) by mouth 3 (three) times a week. Alternates with 659mtablet 12/25/20  Yes Vann, Jessica U, DO   oxyCODONE-acetaminophen (PERCOCET) 10-325 MG tablet Take 1 tablet by mouth 3 (three) times daily as needed for severe pain. Patient not taking: Reported on 02/14/2021 09/18/20   [provider]    Physical Exam: Constitutional: Moderately built and nourished. Vitals:   06/04/21 2045 06/04/21 2115 06/04/21 2200 06/05/21 0140  BP: (!) 193/118 (!) 192/119 (!) 184/117   Pulse: 83 86 84 100  Resp: (!) 24 19 (!) 25 (!) 22  SpO2: 94% 94% 92% 99%   Eyes: Anicteric no pallor. ENMT: No discharge from the ears eyes nose and mouth. Neck: No mass felt.  No neck rigidity. Respiratory: No rhonchi or crepitations. Cardiovascular: S1-S2 heard. Abdomen: Soft nontender bowel sound present. Musculoskeletal: No edema. Skin: No rash. Neurologic: Alert awake oriented to time place and person.  Moves all extremities. Psychiatric: Appears normal.  Normal affect.   Labs on Admission: I have personally reviewed following labs and imaging studies  CBC: Recent Labs  Lab 06/04/21 2112  WBC 4.6  NEUTROABS 3.4  HGB 7.7*  HCT 24.6*  MCV 93.9  PLT 320   Basic Metabolic Panel: Recent Labs  Lab 06/04/21 2112  NA 139  K 4.6  CL 101  CO2 23  GLUCOSE 101*  BUN 72*  CREATININE 14.32*  CALCIUM 8.6*   GFR: CrCl cannot be calculated (Unknown ideal weight.). Liver Function Tests: Recent Labs  Lab 06/04/21 2112  AST 31  ALT 19  ALKPHOS 68  BILITOT 0.8  PROT 6.4*  ALBUMIN 3.4*   Recent Labs  Lab 06/04/21 2112  LIPASE 47   No results for input(s): AMMONIA in the last 168 hours. Coagulation Profile: No results for input(s): INR, PROTIME in the last 168 hours. Cardiac Enzymes: No results for input(s): CKTOTAL, CKMB, CKMBINDEX, TROPONINI in the last 168 hours. BNP (last 3 results) No results for input(s): PROBNP in the last 8760 hours. HbA1C: No results for input(s): HGBA1C in the last 72 hours. CBG: No results for input(s): GLUCAP in the last 168 hours. Lipid Profile: No  results for input(s): CHOL, HDL, LDLCALC, TRIG, CHOLHDL, LDLDIRECT in the last 72 hours. Thyroid Function Tests: No results for input(s): TSH, T4TOTAL, FREET4, T3FREE, THYROIDAB in the last 72 hours. Anemia Panel: Recent Labs    06/04/21 2256  VITAMINB12 530  FOLATE 10.5  FERRITIN 1,454*  TIBC 245*  IRON 32  RETICCTPCT 1.8   Urine analysis:    Component Value Date/Time   COLORURINE STRAW (A) 09/21/2020 2018   APPEARANCEUR HAZY (A) 09/21/2020 2018   LABSPEC 1.010 09/21/2020 2018   PHURINE 7.0 09/21/2020 2018   GLUCOSEU NEGATIVE 09/21/2020 2018   GLUCOSEU NEG mg/dL 10/30/2009 1944   HGBUR NEGATIVE 09/21/2020 2018   BILIRUBINUR NEGATIVE 09/21/2020 2018   BILIRUBINUR negative 05/04/2018 0910   KETONESUR NEGATIVE 09/21/2020 2018   PROTEINUR 100 (A) 09/21/2020 2018   UROBILINOGEN 0.2 05/04/2018 0910   UROBILINOGEN 1 02/14/2014 0946   NITRITE NEGATIVE 09/21/2020 2018   LEUKOCYTESUR LARGE (A) 09/21/2020 2018   Sepsis Labs: $[id number]'[email or web address]'[id number]$ (procalcitonin:4,lacticidven:4) )No results found for this or any previous visit (from the past 240 hour(s)).   Radiological Exams on Admission: DG Chest Portable 1 View  Result Date: 06/04/2021 CLINICAL DATA:  Chest pain EXAM: PORTABLE CHEST 1 VIEW COMPARISON:  02/11/2021 FINDINGS: Diffuse interstitial pulmonary infiltrate is present most in keeping with mild interstitial pulmonary edema, likely cardiogenic in nature. The lungs are symmetrically well expanded. No pneumothorax or pleural effusion. Cardiac size is mildly enlarged, unchanged. Right internal jugular hemodialysis catheter tips are seen within the right atrium. Surgical clips overlie the right axilla and right lung base. No acute bone abnormality. IMPRESSION: Stable cardiomegaly.  Mild cardiogenic failure. Electronically Signed   By: Fidela Salisbury M.D.   On: 06/04/2021 19:18    EKG: Independently reviewed.  Normal sinus rhythm with nonspecific changes.  Assessment/Plan Principal Problem:    Acute pulmonary edema (HCC) Active Problems:   S/P aortic aneurysm repair   ESRD on  dialysis [facility])   Hypertensive urgency   Anemia    Acute pulmonary edema from missing dialysis.  We will consult nephrology for dialysis.  Closely monitor respiratory status. Hypertensive urgency likely contributing to patient's symptoms.  Patient states she has not taken her medications last few days.  Will restart her home medication including clonidine and Toprol ARB Norvasc.  We will keep patient n.p.o. and IV labetalol. Chest pain with prior history of type a dissection thoracic artery aneurysm for which CT angiogram of the chest has been ordered.  Patient's chest pain could also be from uremic pericarditis.  Patient's troponin has been elevated.  Will trend cardiac markers.  Patient is on beta-blockers statin and Coumadin. History of A. fib on beta-blockers and Coumadin. Anemia likely from renal disease.  Stool for occult blood is negative.  Follow anemia panel.  Follow CBC.  Addendum -patient's COVID test came back positive.  Patient is hypoxic likely from fluid overload.  Will check inflammatory markers we will keep patient on remdesivir for now.   DVT prophylaxis: Coumadin. Code Status: Full code. Family Communication: Discussed with patient. Disposition Plan: Home. Consults called: We will consult nephrology. Admission status: Observation.   Rise Patience MD [facility] Pager [phone].  If 7PM-7AM, please contact night-coverage [email or web address] Password [email or web address]  06/05/2021, 1:44 AM

## 2021-06-05 NOTE — Consult Note (Signed)
[facility] Renal Consultation Note  Requesting MD: Marzetta Board, MD Indication for Consultation: ESRD  Chief complaint: shortness of breath   HPI: Monique Henderson is a 71 y.o. female with a history of ESRD, HTN, COPD, and hep C s/p treatment who presented to the hospital with shortness of breath.  She was found to have covid.  She has been on up to 4 liters oxygen. She has been weaned to 3 liters per nursing.  Nephrology is consulted for assistance with management of hemodialysis.  She states that she has been going to treatment however the last tx I see at her center was 05/31/21.  She is charted as having chest pain but denied any to me.  She states that she doesn't use oxygen at home.   PMHx:   Past Medical History:  Diagnosis Date   AF (paroxysmal atrial fibrillation) ([place]) 05/29/2019   on Coumadin   Aortic atherosclerosis ([place]) 07/05/2019   Aortic dissection ([place]) 04/04/2019   s/p repair   Bone spur 2008   Right calcaneal foot spur   Breast cancer ([place]) 2004   Ductal carcinoma in situ of the left breast; S/P left partial mastectomy 02/26/2003; S/P re-excision of cranial and lateral margins11/18/2004.radiation   Cerebral thrombosis with cerebral infarction 05/22/2019   Chronic low back pain 06/22/2016   Chronic obstructive lung disease ([place]) 01/16/2017   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 Autumn Messing; S/P re-excision of superior and inferior margins 10/27/2012.    ESRD on dialysis ([place]) 05/29/2019   Essential hypertension 09/16/2006   GERD 09/16/2006   Hepatitis C    treated and RNA confirmed not detectable 01/2017   Insomnia 03/14/2015   Malnutrition of moderate degree 05/19/2019   Non compliance with medical treatment 12/04/2017   Normocytic anemia    With thrombocytosis   Osteoarthritis    Right ureteral stone 2002   S/P lumbar spinal fusion 01/18/2014   S/P lumbar decompressive  laminectomy, fusion, and plating for lumbar spinal stenosis on 05/27/2009 by Dr. Eustace Moore.  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 Eustace Moore.     Tobacco use disorder 04/19/2009   Uterine fibroid    Wears dentures    top    Past Surgical History:  Procedure Laterality Date   ANTERIOR LAT LUMBAR FUSION N/A 01/18/2014   Procedure: ANTERIOR LATERAL LUMBAR FUSION LUMBAR TWO-THREE;  Surgeon: Eustace Moore, MD;  Location: [facility];  Service: Neurosurgery;  Laterality: N/A;   ANTERIOR LUMBAR FUSION  01/18/2014   AV FISTULA PLACEMENT Left 04/20/2019   Procedure: ARTERIOVENOUS (AV) FISTULA CREATION;  Surgeon: [name], MD;  Location: [facility];  Service: Vascular;  Laterality: Left;   BACK SURGERY     BREAST LUMPECTOMY Left 01/2003   BREAST LUMPECTOMY Right 2014   BREAST LUMPECTOMY WITH NEEDLE LOCALIZATION AND AXILLARY SENTINEL LYMPH NODE BX Right 10/13/2012   Procedure: BREAST LUMPECTOMY WITH NEEDLE LOCALIZATION;  Surgeon: [name], MD;  Location: [facility];  Service: General;  Laterality: Right;  Right breast wire localized lumpectomy   INSERTION OF DIALYSIS CATHETER Right 04/20/2019   Procedure: INSERTION OF DIALYSIS CATHETER, right internal jugular;  Surgeon: [name], MD;  Location: [facility];  Service: Vascular;  Laterality:  Right;   INSERTION OF DIALYSIS CATHETER Right 09/24/2020   Procedure: INSERTION OF TUNNELED DIALYSIS CATHETER;  Surgeon: Rosetta Posner, MD;  Location: [facility];  Service: Vascular;  Laterality: Right;   IR FLUORO GUIDE CV LINE RIGHT  09/22/2020   IR THORACENTESIS ASP PLEURAL SPACE W/IMG GUIDE  05/19/2019   IR US GUIDE VASC ACCESS LEFT  09/22/2020   IR US GUIDE VASC ACCESS RIGHT  09/22/2020   IR VENOCAVAGRAM SVC  09/22/2020    LAMINECTOMY  05/27/2009   Lumbar decompressive laminectomy, fusion and plating for lumbar spinal stensosis   LIGATION OF ARTERIOVENOUS  FISTULA Left 09/24/2020   Procedure: LIGATION OF LEFT ARM ARTERIOVENOUS  FISTULA;  Surgeon: Rosetta Posner, MD;  Location: [facility];  Service: Vascular;  Laterality: Left;   LUMBAR LAMINECTOMY/DECOMPRESSION MICRODISCECTOMY Left 03/23/2013   Procedure: LUMBAR LAMINECTOMY/DECOMPRESSION MICRODISCECTOMY 1 LEVEL;  Surgeon: Eustace Moore, MD;  Location: [facility];  Service: Neurosurgery;  Laterality: Left;  LUMBAR LAMINECTOMY/DECOMPRESSION MICRODISCECTOMY 1 LEVEL   MASTECTOMY, PARTIAL Left 02/26/2003   ; S/P re-excision of cranial and lateral margins 04/19/2003.    RE-EXCISION OF BREAST CANCER,SUPERIOR MARGINS Right 10/27/2012   Procedure: RE-EXCISION OF BREAST CANCER,SUPERIOR and inferior MARGINS;  Surgeon: [name], MD;  Location: [facility];  Service: General;  Laterality: Right;   RE-EXCISION OF BREAST LUMPECTOMY Left 04/2003   TEE WITHOUT CARDIOVERSION N/A 04/04/2019   Procedure: Transesophageal Echocardiogram (Tee);  Surgeon: Wonda Olds, MD;  Location: [facility];  Service: Open Heart Surgery;  Laterality: N/A;   THORACIC AORTIC ANEURYSM REPAIR N/A 04/04/2019   Procedure: THORACIC ASCENDING ANEURYSM REPAIR (AAA)  USING 28 MM X 30 CM HEMASHIELD PLATINUM VASCULAR GRAFT;  Surgeon: Wonda Olds, MD;  [facility];  Service: Open Heart Surgery;  Laterality: N/A;    Family Hx:  Family History  Problem Relation Age of Onset   Colon cancer Mother 27   Hypertension Mother    Diabetes Sister 7   Hypertension Sister    Diabetes Brother    Hypertension Brother    Diabetes Brother    Hypertension Brother    Kidney disease Son        s/p renal transplant   Hypertension Son    Diabetes Son    Multiple sclerosis Son    Bone cancer Sister 33   Breast cancer Neg Hx    Cervical cancer Neg Hx     Social History:  reports that she quit smoking about 2 years  ago. Her smoking use included cigarettes. She has a 11.00 pack-year smoking history. She has never used smokeless tobacco. She reports that she does not currently use alcohol after a past usage of about 2.0 standard drinks per week. She reports that she does not currently use drugs after having used the following drugs: Cocaine.  Allergies:  Allergies  Allergen Reactions   Shrimp [Shellfish Allergy] Shortness Of Breath   Bactroban [Mupirocin] Other (See Comments)    "Sores in nose"   Vicodin [Hydrocodone-Acetaminophen] Itching and Nausea And Vomiting    This is patient's home medication   Eliquis [Apixaban] Rash   Lisinopril Cough   Tylenol [Acetaminophen] Itching    Medications: Prior to Admission medications   Medication Sig Start Date End Date Taking? Authorizing Provider  acetaminophen (TYLENOL) 325 MG tablet Take 650 mg by mouth every 6 (six) hours as needed for mild pain.   Yes [provider]  albuterol (VENTOLIN HFA) 108 (90 Base) MCG/ACT inhaler Inhale  2 puffs into the lungs every 6 (six) hours as needed for wheezing or shortness of breath. 07/30/20  Yes Maximiano Coss, NP  amLODipine (NORVASC) 10 MG tablet Take 1 tablet (10 mg total) by mouth daily. 02/14/21  Yes Maximiano Coss, NP  cloNIDine (CATAPRES - DOSED IN MG/24 HR) 0.3 mg/24hr patch Place 1 patch (0.3 mg total) onto the skin every Saturday. 02/15/21  Yes Maximiano Coss, NP  cyclobenzaprine (FLEXERIL) 5 MG tablet Take 5 mg by mouth 2 (two) times daily as needed for muscle spasms. 09/18/20  Yes [provider]  hydrALAZINE (APRESOLINE) 50 MG tablet Take 1 tablet (50 mg total) by mouth 3 (three) times daily. Patient taking differently: Take 50 mg by mouth in the morning and at bedtime. 02/14/21  Yes Maximiano Coss, NP  loperamide (IMODIUM) 2 MG capsule Take 2 mg by mouth as needed for diarrhea or loose stools. 07/15/20  Yes [provider]  losartan (COZAAR) 100 MG tablet Take 1 tablet (100 mg total)  by mouth daily. 02/14/21  Yes Maximiano Coss, NP  metoprolol tartrate (LOPRESSOR) 25 MG tablet Take 3 tablets (75 mg total) by mouth 2 (two) times daily. 02/14/21  Yes Maximiano Coss, NP  mirtazapine (REMERON) 7.5 MG tablet Take 1 tablet (7.5 mg total) by mouth at bedtime. Patient taking differently: Take 7.5 mg by mouth at bedtime as needed (sleep). 07/15/20  Yes Maximiano Coss, NP  [facility] 4 MG/0.1ML LIQD nasal spray kit Place 1 spray into the nose as needed for opioid reversal. If no response within 3 minutes then repeat once in the other nostril.  Call 911. 05/15/20  Yes [provider]  ondansetron (ZOFRAN) 4 MG tablet Take 1 tablet (4 mg total) by mouth every 8 (eight) hours as needed for nausea or vomiting. 08/03/19  Yes Jacelyn Pi, Lilia Argue, MD  pregabalin (LYRICA) 50 MG capsule Take 100 mg by mouth at bedtime. 05/07/21  Yes [provider]  rosuvastatin (CRESTOR) 10 MG tablet Take 1 tablet (10 mg total) by mouth daily. 02/14/21 06/05/21 Yes Maximiano Coss, NP  torsemide (DEMADEX) 20 MG tablet Take 40 mg by mouth daily. 05/19/21  Yes [provider]  VELPHORO 500 MG chewable tablet Chew 1,000 mg by mouth 3 (three) times daily. 05/19/21  Yes [provider]  VITAMIN D PO Take 1 tablet by mouth daily.   Yes [provider]  warfarin (COUMADIN) 6 MG tablet Take 1 tablet (6 mg total) by mouth 4 (four) times a week. Alternates with 7.74m tablet 12/24/20  Yes Vann, Jessica U, DO  warfarin (COUMADIN) 7.5 MG tablet Take 1 tablet (7.5 mg total) by mouth 3 (three) times a week. Alternates with 682mtablet 12/25/20  Yes Vann, Jessica U, DO  oxyCODONE-acetaminophen (PERCOCET) 10-325 MG tablet Take 1 tablet by mouth 3 (three) times daily as needed for severe pain. Patient not taking: Reported on 02/14/2021 09/18/20   [provider]    I have reviewed the patient's current and reported prior to admission medications.  Labs:  BMP Latest Ref Rng & Units  06/05/2021 06/04/2021 02/14/2021  Glucose 70 - 99 mg/dL 105(H) 101(H) 98  BUN 8 - 23 mg/dL 77(H) 72(H) 27(H)  Creatinine 0.44 - 1.00 mg/dL 15.26(H) 14.32(H) 5.22(HH)  BUN/Creat Ratio 12 - 28 - - -  Sodium 135 - 145 mmol/L 137 139 141  Potassium 3.5 - 5.1 mmol/L 5.4(H) 4.6 4.3  Chloride 98 - 111 mmol/L 99 101 97  CO2 22 - 32 mmol/L 23  23 29  Calcium 8.9 - 10.3 mg/dL 8.3(L) 8.6(L) 10.1     ROS:  Pertinent items noted in HPI and remainder of comprehensive ROS otherwise negative.    Physical Exam: Vitals:   06/05/21 1145 06/05/21 1200  BP: (!) 157/93 (!) 159/94  Pulse: 67 66  Resp: (!) 21 20  Temp:    SpO2: 100% 100%     General: elderly female in bed in NAD  HEENT: NCAT Eyes: EOMI sclera anicteric Neck: supple trachea midline Heart; RRR Lungs: unlabored and no audible wheezing; on 2.5 liters supp oxygen Abdomen: soft/nt/nd Extremities: no edema; no cyanosis or clubbing Skin: no rash on extremities exposed Access RIJ tunn catheter   Outpatient dialysis prescription:  [facility]  TTS  3 hours 45 min 2 K / 2 Ca EDW 44.5 kg (last post was 44.6 kg on 05/31/21) BF 400, auto 1.5 for DF Tunn catheter Hectorol 3 mcg three times a week  On venofer 100 mg regimen No heparin (heparin catheter lock permissible) Mircera 60 mcg every 2 weeks - last HB 8.2 on 12/29; last mircera was at 50 mcg on 05/22/21  Assessment/Plan:  # ESRD - HD per TTS schedule  - renal diet is ordered  # Covid PNA  - Therapies per primary team  - she is on remdesivir - will optimize volume status with HD   # Acute hypoxic resp failure  - 2/2 covid as well as overload  - HD as above and covid management per primary   # HTN  - optimize volume status with HD  # anemia of CKD  - transfusion per primary team parameters  - will increase ESA - start aranesp 150 mcg weekly on thursdays  # Metabolic bone disease  - continue hectorol and home binders   She is appropriate for inpatient monitoring and I  would recommend switching to same  Claudia Desanctis 06/05/2021, 2:59 PM

## 2021-06-05 NOTE — ED Notes (Signed)
Rowland Lathe [phone] niece requesting an update

## 2021-06-05 NOTE — Progress Notes (Signed)
ANTICOAGULATION CONSULT NOTE - Initial Consult  Pharmacy Consult for Coumadin Indication: atrial fibrillation  Allergies  Allergen Reactions   Shrimp [Shellfish Allergy] Shortness Of Breath   Bactroban [Mupirocin] Other (See Comments)    "Sores in nose"   Vicodin [Hydrocodone-Acetaminophen] Itching and Nausea And Vomiting    This is patient's home medication   Eliquis [Apixaban] Rash   Lisinopril Cough   Tylenol [Acetaminophen] Itching   Vital Signs: BP: 168/114 (01/05 0745) Pulse Rate: 70 (01/05 0745)  Labs: Recent Labs    06/04/21 2112 06/05/21 0626  HGB 7.7*  --   HCT 24.6*  --   PLT 255  --   CREATININE 14.32* 15.26*  TROPONINIHS 158* 186*    Medical History: Past Medical History:  Diagnosis Date   AF (paroxysmal atrial fibrillation) ([place]) 05/29/2019   on Coumadin   Aortic atherosclerosis ([place]) 07/05/2019   Aortic dissection ([place]) 04/04/2019   s/p repair   Bone spur 2008   Right calcaneal foot spur   Breast cancer ([place]) 2004   Ductal carcinoma in situ of the left breast; S/P left partial mastectomy 02/26/2003; S/P re-excision of cranial and lateral margins11/18/2004.radiation   Cerebral thrombosis with cerebral infarction 05/22/2019   Chronic low back pain 06/22/2016   Chronic obstructive lung disease ([place]) 01/16/2017   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 Autumn Messing; S/P re-excision of superior and inferior margins 10/27/2012.    ESRD on dialysis ([place]) 05/29/2019   Essential hypertension 09/16/2006   GERD 09/16/2006   Hepatitis C    treated and RNA confirmed not detectable 01/2017   Insomnia 03/14/2015   Malnutrition of moderate degree 05/19/2019   Non compliance with medical treatment 12/04/2017   Normocytic anemia    With thrombocytosis   Osteoarthritis    Right ureteral stone 2002   S/P lumbar spinal fusion 01/18/2014   S/P lumbar decompressive laminectomy, fusion, and plating for lumbar  spinal stenosis on 05/27/2009 by Dr. Eustace Moore.  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 Eustace Moore.     Tobacco use disorder 04/19/2009   Uterine fibroid    Wears dentures    top    Assessment: 71yo female c/o chills, SOB, and cough, admitted for acute pulmonary edema d/t missing HD, also found to have Covid, to continue Coumadin for Afib; alternates Coumadin 6mg  with 7.5mg , last dose taken was 7.5mg  on 06/02/21.  Goal of Therapy:  INR 2-3   Plan:  Monitor INR for dose adjustments.  Rogue Bussing 06/05/2021,8:10 AM

## 2021-06-05 NOTE — Progress Notes (Signed)
[facility] for Coumadin Indication: atrial fibrillation  Allergies  Allergen Reactions   Shrimp [Shellfish Allergy] Shortness Of Breath   Bactroban [Mupirocin] Other (See Comments)    "Sores in nose"   Vicodin [Hydrocodone-Acetaminophen] Itching and Nausea And Vomiting    This is patient's home medication   Eliquis [Apixaban] Rash   Lisinopril Cough   Tylenol [Acetaminophen] Itching   Vital Signs: Temp: 98.4 F (36.9 C) (01/05 0820) Temp Source: Oral (01/05 0820) BP: 170/104 (01/05 0845) Pulse Rate: 66 (01/05 0845)  Labs: Recent Labs    06/04/21 2112 06/05/21 0625 06/05/21 0626  HGB 7.7*  --   --   HCT 24.6*  --   --   PLT 255  --   --   LABPROT  --  16.4*  --   INR  --  1.3*  --   CREATININE 14.32*  --  15.26*  TROPONINIHS 158*  --  186*    Medical History: Past Medical History:  Diagnosis Date   AF (paroxysmal atrial fibrillation) ([place]) 05/29/2019   on Coumadin   Aortic atherosclerosis ([place]) 07/05/2019   Aortic dissection ([place]) 04/04/2019   s/p repair   Bone spur 2008   Right calcaneal foot spur   Breast cancer ([place]) 2004   Ductal carcinoma in situ of the left breast; S/P left partial mastectomy 02/26/2003; S/P re-excision of cranial and lateral margins11/18/2004.radiation   Cerebral thrombosis with cerebral infarction 05/22/2019   Chronic low back pain 06/22/2016   Chronic obstructive lung disease ([place]) 01/16/2017   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 Autumn Messing; S/P re-excision of superior and inferior margins 10/27/2012.    ESRD on dialysis ([place]) 05/29/2019   Essential hypertension 09/16/2006   GERD 09/16/2006   Hepatitis C    treated and RNA confirmed not detectable 01/2017   Insomnia 03/14/2015   Malnutrition of moderate degree 05/19/2019   Non compliance with medical treatment 12/04/2017   Normocytic anemia    With thrombocytosis   Osteoarthritis     Right ureteral stone 2002   S/P lumbar spinal fusion 01/18/2014   S/P lumbar decompressive laminectomy, fusion, and plating for lumbar spinal stenosis on 05/27/2009 by Dr. Eustace Moore.  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 Eustace Moore.     Tobacco use disorder 04/19/2009   Uterine fibroid    Wears dentures    top    Assessment: 71yo female c/o chills, SOB, and cough, admitted for acute pulmonary edema d/t missing HD, also found to have Covid, to continue Coumadin for Afib; alternates Coumadin 6mg  with 7.5mg , last dose taken was 7.5mg  on 06/02/21.  INR this AM 1.3. D-dimer 0.94.  Hgb 7.7 - stool occult negative, Plts within normal limits.   PTA Warfarin regimen 7.5mg  alternating with 6mg  every other day.  Last dose on 06/02/21.    Goal of Therapy:  INR 2-3   Plan:  Resume Warfarin at 7.5mg  po x1.  Monitor daily INR for dose adjustments. Monitor CBC and for any signs of symptoms of bleeding.   Sloan Leiter, PharmD, BCPS, BCCCP Clinical Pharmacist Please refer to [facility] for [place] numbers 06/05/2021,9:14 AM

## 2021-06-05 NOTE — ED Notes (Signed)
Full linen and brief change at this time

## 2021-06-05 NOTE — Progress Notes (Signed)
Patient seen & examined today, H&P reviewed and I agree with the assessment and plan   71 year old female with ESRD on HD, TTS, comes in with shortness of breath over the last 24 hours.  He has missed dialysis the day prior to admission, patient states due to not feeling well but niece tells me because she was busy.  Imaging showed patency hazy airspace disease in the lungs bilaterally, suggesting edema, less likely infiltrate. She also tested positive for covid   Nephrology consulted. She remains on 4L. Due to hypoxia and Covid, she was started on Remdesivir. Unclear how much her hypoxia is due to Covid vs missing HD. Will dialyze tonight per nephrology, if respiratory status returns to baseline tomorrow she will likely dc home. D/w Dr Royce Macadamia. Niece and patient's sister updated over the phone  Tihanna Goodson M. Cruzita Lederer, MD, PhD [facility]  Between 7 am - 7 pm you can contact me via Amion (for emergencies) or [place] (non urgent matters).  I am not available 7 pm - 7 am, please contact night coverage MD/APP via Amion

## 2021-06-05 NOTE — ED Notes (Signed)
RN called hemodialysis, pt on the list for dialysis in the morning 06/06/20.

## 2021-06-05 NOTE — ED Notes (Signed)
Sister's daughter/caregiver Rowland Lathe [phone] would like an update

## 2021-06-06 ENCOUNTER — Other Ambulatory Visit: Payer: Self-pay

## 2021-06-06 DIAGNOSIS — J9601 Acute respiratory failure with hypoxia: Secondary | ICD-10-CM

## 2021-06-06 LAB — PROTIME-INR
INR: 1.4 — ABNORMAL HIGH (ref 0.8–1.2)
Prothrombin Time: 17.1 seconds — ABNORMAL HIGH (ref 11.4–15.2)

## 2021-06-06 LAB — CBC
HCT: 26.4 % — ABNORMAL LOW (ref 36.0–46.0)
Hemoglobin: 8.1 g/dL — ABNORMAL LOW (ref 12.0–15.0)
MCH: 28.8 pg (ref 26.0–34.0)
MCHC: 30.7 g/dL (ref 30.0–36.0)
MCV: 94 fL (ref 80.0–100.0)
Platelets: 262 10*3/uL (ref 150–400)
RBC: 2.81 MIL/uL — ABNORMAL LOW (ref 3.87–5.11)
RDW: 19.4 % — ABNORMAL HIGH (ref 11.5–15.5)
WBC: 3.7 10*3/uL — ABNORMAL LOW (ref 4.0–10.5)
nRBC: 0 % (ref 0.0–0.2)

## 2021-06-06 LAB — BASIC METABOLIC PANEL
Anion gap: 18 — ABNORMAL HIGH (ref 5–15)
BUN: 110 mg/dL — ABNORMAL HIGH (ref 8–23)
CO2: 21 mmol/L — ABNORMAL LOW (ref 22–32)
Calcium: 8 mg/dL — ABNORMAL LOW (ref 8.9–10.3)
Chloride: 96 mmol/L — ABNORMAL LOW (ref 98–111)
Creatinine, Ser: 17.55 mg/dL — ABNORMAL HIGH (ref 0.44–1.00)
GFR, Estimated: 2 mL/min — ABNORMAL LOW (ref >60)
Glucose, Bld: 126 mg/dL — ABNORMAL HIGH (ref 70–99)
Potassium: 5.1 mmol/L (ref 3.5–5.1)
Sodium: 135 mmol/L (ref 135–145)

## 2021-06-06 LAB — HEPATITIS B SURFACE ANTIBODY,QUALITATIVE: Hep B S Ab: REACTIVE — AB

## 2021-06-06 LAB — HEPATITIS B SURFACE ANTIGEN: Hepatitis B Surface Ag: NONREACTIVE

## 2021-06-06 MED ORDER — SODIUM CHLORIDE 0.9 % IV SOLN: 100.0000 mL | INTRAVENOUS | Status: DC | PRN

## 2021-06-06 MED ORDER — GUAIFENESIN-DM 100-10 MG/5ML PO SYRP
5.0000 mL | ORAL_SOLUTION | ORAL | Status: DC | PRN
  Administered 2021-06-06: 5 mL via ORAL
  Filled 2021-06-06: qty 5

## 2021-06-06 MED ORDER — LIDOCAINE-PRILOCAINE 2.5-2.5 % EX CREA: 1.0000 "application " | TOPICAL_CREAM | CUTANEOUS | Status: DC | PRN

## 2021-06-06 MED ORDER — OXYCODONE HCL 5 MG PO TABS
5.0000 mg | ORAL_TABLET | Freq: Four times a day (QID) | ORAL | Status: DC | PRN
  Administered 2021-06-06: 5 mg via ORAL
  Filled 2021-06-06: qty 1

## 2021-06-06 MED ORDER — SUCROFERRIC OXYHYDROXIDE 500 MG PO CHEW
1000.0000 mg | CHEWABLE_TABLET | Freq: Three times a day (TID) | ORAL | Status: DC
  Filled 2021-06-06 (×4): qty 2

## 2021-06-06 MED ORDER — HEPARIN SODIUM (PORCINE) 1000 UNIT/ML DIALYSIS: 1000.0000 [IU] | INTRAMUSCULAR | Status: DC | PRN

## 2021-06-06 MED ORDER — LIDOCAINE HCL (PF) 1 % IJ SOLN: 5.0000 mL | INTRAMUSCULAR | Status: DC | PRN

## 2021-06-06 MED ORDER — WARFARIN SODIUM 7.5 MG PO TABS
7.5000 mg | ORAL_TABLET | Freq: Once | ORAL | Status: AC
  Administered 2021-06-06: 7.5 mg via ORAL
  Filled 2021-06-06: qty 1

## 2021-06-06 MED ORDER — DARBEPOETIN ALFA 150 MCG/0.3ML IJ SOSY
150.0000 ug | PREFILLED_SYRINGE | Freq: Once | INTRAMUSCULAR | Status: DC
  Filled 2021-06-06: qty 0.3

## 2021-06-06 MED ORDER — ALTEPLASE 2 MG IJ SOLR: 2.0000 mg | Freq: Once | INTRAMUSCULAR | Status: DC | PRN

## 2021-06-06 MED ORDER — PENTAFLUOROPROP-TETRAFLUOROETH EX AERO: 1.0000 "application " | INHALATION_SPRAY | CUTANEOUS | Status: DC | PRN

## 2021-06-06 NOTE — Progress Notes (Signed)
[facility] Progress Note  Name: Monique Henderson MRN: [id number] DOB: Jan 04, 1951  Chief Complaint:  Shortness of breath  Subjective:  strict ins/outs are not available.  She was moved up to [place].  Her dialysis treatment was moved to today due to staffing.  She has been on 2.5 liters   Review of systems:  Reports some shortness of breath  Denies chest pain  Denies n/v  ---------------- Background on consult:  Monique Henderson is a 71 y.o. female with a history of ESRD, HTN, COPD, and hep C s/p treatment who presented to the hospital with shortness of breath.  She was found to have covid.  She has been on up to 4 liters oxygen. She has been weaned to 3 liters per nursing.  Nephrology is consulted for assistance with management of hemodialysis.  She states that she has been going to treatment however the last tx I see at her center was 05/31/21.  She is charted as having chest pain but denied any to me.  She states that she doesn't use oxygen at home.   Intake/Output Summary (Last 24 hours) at 06/06/2021 1537 Last data filed at 06/06/2021 1255 Gross per 24 hour  Intake 240 ml  Output --  Net 240 ml    Vitals:  Vitals:   06/06/21 1100 06/06/21 1200 06/06/21 1211 06/06/21 1255  BP: (!) 145/91 130/80  122/73  Pulse: 63 (!) 59  61  Resp: (!) 23 19  15   Temp:   98.6 F (37 C) 98.7 F (37.1 C)  TempSrc:   Oral Oral  SpO2: 100% 100%  98%     Physical Exam:  General: elderly female in bed in NAD  HEENT: NCAT Eyes: EOMI sclera anicteric Neck: supple trachea midline Heart; RRR Lungs: crackles but unlabored; on 2.5 liters supp oxygen Abdomen: soft/nt/nd Extremities: no edema; no cyanosis or clubbing Skin: no rash on extremities exposed Access RIJ tunn catheter   Medications reviewed   Labs:  BMP Latest Ref Rng & Units 06/06/2021 06/05/2021 06/04/2021  Glucose 70 - 99 mg/dL 126(H) 105(H) 101(H)  BUN 8 - 23 mg/dL 110(H) 77(H) 72(H)  Creatinine 0.44 - 1.00 mg/dL  17.55(H) 15.26(H) 14.32(H)  BUN/Creat Ratio 12 - 28 - - -  Sodium 135 - 145 mmol/L 135 137 139  Potassium 3.5 - 5.1 mmol/L 5.1 5.4(H) 4.6  Chloride 98 - 111 mmol/L 96(L) 99 101  CO2 22 - 32 mmol/L 21(L) 23 23  Calcium 8.9 - 10.3 mg/dL 8.0(L) 8.3(L) 8.6(L)   Outpatient dialysis prescription:  [facility]  TTS  3 hours 45 min 2 K / 2 Ca EDW 44.5 kg (last post was 44.6 kg on 05/31/21) BF 400, auto 1.5 for DF Tunn catheter Hectorol 3 mcg three times a week  On venofer 100 mg regimen No heparin (heparin catheter lock permissible) Mircera 60 mcg every 2 weeks - last HB 8.2 on 12/29; last mircera was at 50 mcg on 05/22/21   Assessment/Plan:   # ESRD - HD tonight off schedule then HD per TTS schedule  - renal diet is ordered   # Covid PNA  - Therapies per primary team  - she is on remdesivir - will optimize volume status with HD    # Acute hypoxic resp failure  - 2/2 covid as well as overload  - HD as above and covid management per primary    # HTN  - optimize volume status with HD   # anemia  of CKD  - transfusion per primary team parameters  - will increase ESA - start aranesp 150 mcg weekly - missed yesterday - give today   # Metabolic bone disease  - continue hectorol and resume home velphoro. Check phos if stays   Disposition - getting HD here then per primary team discretion. If discharged tomorrow am she is able to go to her home outpatient HD unit      Claudia Desanctis, MD 06/06/2021 3:37 PM

## 2021-06-06 NOTE — Progress Notes (Signed)
PROGRESS NOTE  Monique Henderson GNO:[id number] DOB: 1950-12-11 DOA: 06/04/2021 PCP: System, Provider Not In   LOS: 1 day   Brief Narrative / Interim history: 71 year old female with ESRD on HD, TTS, comes in with shortness of breath over the last 24 hours.  He has missed dialysis the day prior to admission, patient states due to not feeling well but niece tells me because she was busy.  Imaging showed patency hazy airspace disease in the lungs bilaterally, suggesting edema, less likely infiltrate. She also tested positive for covid  Subjective / 24h Interval events: Still short of breath but feeling little bit better.   Assessment & Plan: Principal Problem Acute hypoxic respiratory failure - possibly multifactorial, unclear with related to missed dialysis versus Covid.  She came to the hospital almost 2 days ago due to missed dialysis and respiratory failure but unfortunately has not been dialyzed yet !!! so cannot tell which disease process contributes more.  Remains hypoxic and symptomatic this morning  Active Problems COVID-19-we will repeat chest x-ray after dialysis and assess clinically, for now continue Remdesivir  Essential hypertension-continue clonidine, amlodipine, hydralazine, losartan, metoprolol, torsemide.  Blood pressure still high but she does need dialysis  Hyperlipidemia-continue statin  History of A. fib-continue beta-blockers, Coumadin  Anemia from renal disease-monitor hemoglobin  Acute on chronic diastolic CHF-most recent 2D echo in April 2022 showed normal EF and grade 2 diastolic dysfunction.  Currently fluid overloaded due to missed dialysis  Scheduled Meds:  amLODipine  10 mg Oral Daily   Chlorhexidine Gluconate Cloth  6 each Topical Q0600   cloNIDine  0.3 mg Transdermal Q Sat   [START ON 06/12/2021] darbepoetin (ARANESP) injection - DIALYSIS  150 mcg Intravenous Q Thu-HD   hydrALAZINE  50 mg Oral TID   losartan  100 mg Oral Daily   metoprolol tartrate   75 mg Oral BID   pregabalin  100 mg Oral QHS   rosuvastatin  10 mg Oral Daily   torsemide  40 mg Oral Daily   warfarin  7.5 mg Oral ONCE-1600   Warfarin - Pharmacist Dosing Inpatient   Does not apply q1600   Continuous Infusions:  remdesivir 100 mg in NS 100 mL     PRN Meds:.acetaminophen, albuterol, labetalol, mirtazapine  Diet Orders (From admission, onward)     Start     Ordered   06/05/21 0143  Diet renal with fluid restriction Fluid restriction: 1200 mL Fluid; Room service appropriate? Yes; Fluid consistency: Thin  Diet effective now       Question Answer Comment  Fluid restriction: 1200 mL Fluid   Room service appropriate? Yes   Fluid consistency: Thin      06/05/21 0144            DVT prophylaxis:  warfarin (COUMADIN) tablet 7.5 mg     Code Status: Full Code  Family Communication: Discussed with family over the phone  Status is: Inpatient  Remains inpatient appropriate because: Persistent symptoms, hypoxia   Level of care: Telemetry Medical  Consultants:  Nephrology   Procedures:  none  Microbiology  none  Antimicrobials: Remdesivir    Objective: Vitals:   06/06/21 0815 06/06/21 0836 06/06/21 0900 06/06/21 1000  BP: (!) 156/96  (!) 165/95 (!) 157/92  Pulse: 61  63 62  Resp: 20  (!) 21 (!) 26  Temp:  98.3 F (36.8 C)    TempSrc:  Oral    SpO2: 100%  100% 100%   No intake or output data in  the 24 hours ending 06/06/21 1106 There were no vitals filed for this visit.  Examination:  Constitutional: NAD Eyes: no scleral icterus ENMT: Mucous membranes are moist.  Neck: normal, supple Respiratory: Diffuse bibasilar crackles, no wheezing.  Tachypneic Cardiovascular: Regular rate and rhythm, no murmurs / rubs / gallops.  Abdomen: non distended, no tenderness. Bowel sounds positive.  Musculoskeletal: no clubbing / cyanosis.  Skin: no rashes Neurologic: No focal deficits  Data Reviewed: I have independently reviewed following labs and  imaging studies   CBC: Recent Labs  Lab 06/04/21 2112  WBC 4.6  NEUTROABS 3.4  HGB 7.7*  HCT 24.6*  MCV 93.9  PLT 295   Basic Metabolic Panel: Recent Labs  Lab 06/04/21 2112 06/05/21 0626  NA 139 137  K 4.6 5.4*  CL 101 99  CO2 23 23  GLUCOSE 101* 105*  BUN 72* 77*  CREATININE 14.32* 15.26*  CALCIUM 8.6* 8.3*   Liver Function Tests: Recent Labs  Lab 06/04/21 2112  AST 31  ALT 19  ALKPHOS 68  BILITOT 0.8  PROT 6.4*  ALBUMIN 3.4*   Coagulation Profile: Recent Labs  Lab 06/05/21 0625 06/06/21 0500  INR 1.3* 1.4*   HbA1C: No results for input(s): HGBA1C in the last 72 hours. CBG: No results for input(s): GLUCAP in the last 168 hours.  Recent Results (from the past 240 hour(s))  Resp Panel by RT-PCR (Flu A&B, Covid) Nasopharyngeal Swab     Status: Abnormal   Collection Time: 06/05/21  2:03 AM   Specimen: Nasopharyngeal Swab; Nasopharyngeal(NP) swabs in vial transport medium  Result Value Ref Range Status   SARS Coronavirus 2 by RT PCR POSITIVE (A) NEGATIVE Final    Comment: (NOTE) SARS-CoV-2 target nucleic acids are DETECTED.  The SARS-CoV-2 RNA is generally detectable in upper respiratory specimens during the acute phase of infection. Positive results are indicative of the presence of the identified virus, but do not rule out bacterial infection or co-infection with other pathogens not detected by the test. Clinical correlation with patient history and other diagnostic information is necessary to determine patient infection status. The expected result is Negative.  Fact Sheet for Patients: EntrepreneurPulse.com.au  Fact Sheet for Healthcare Providers: IncredibleEmployment.be  This test is not yet approved or cleared by the [place] FDA and  has been authorized for detection and/or diagnosis of SARS-CoV-2 by FDA under an Emergency Use Authorization (EUA).  This EUA will remain in effect (meaning this test  can be used) for the duration of  the COVID-19 declaration under Section 564(b)(1) of the A ct, 21 U.S.C. section 360bbb-3(b)(1), unless the authorization is terminated or revoked sooner.     Influenza A by PCR NEGATIVE NEGATIVE Final   Influenza B by PCR NEGATIVE NEGATIVE Final    Comment: (NOTE) The Xpert Xpress SARS-CoV-2/FLU/RSV plus assay is intended as an aid in the diagnosis of influenza from Nasopharyngeal swab specimens and should not be used as a sole basis for treatment. Nasal washings and aspirates are unacceptable for Xpert Xpress SARS-CoV-2/FLU/RSV testing.  Fact Sheet for Patients: EntrepreneurPulse.com.au  Fact Sheet for Healthcare Providers: IncredibleEmployment.be  This test is not yet approved or cleared by the [place] FDA and has been authorized for detection and/or diagnosis of SARS-CoV-2 by FDA under an Emergency Use Authorization (EUA). This EUA will remain in effect (meaning this test can be used) for the duration of the COVID-19 declaration under Section 564(b)(1) of the Act, 21 U.S.C. section 360bbb-3(b)(1), unless the authorization is terminated or  revoked.  Performed at [facility], [place] [address], [place], [place] [address]      Radiology Studies: No results found.   Marzetta Board, MD, PhD [facility]  Between 7 am - 7 pm I am available, please contact me via Amion (for emergencies) or Securechat (non urgent messages)  Between 7 pm - 7 am I am not available, please contact night coverage MD/APP via Amion

## 2021-06-06 NOTE — Progress Notes (Signed)
[place] for Coumadin Indication: atrial fibrillation  Vital Signs: Temp: 98.9 F (37.2 C) (01/05 2039) Temp Source: Oral (01/05 2039) BP: 156/96 (01/06 0815) Pulse Rate: 61 (01/06 0815)  Labs: Recent Labs    06/04/21 2112 06/05/21 0625 06/05/21 0626 06/06/21 0500  HGB 7.7*  --   --   --   HCT 24.6*  --   --   --   PLT 255  --   --   --   LABPROT  --  16.4*  --  17.1*  INR  --  1.3*  --  1.4*  CREATININE 14.32*  --  15.26*  --   TROPONINIHS 158*  --  186*  --     Assessment: 71yo female c/o chills, SOB, and cough, admitted for acute pulmonary edema d/t missing HD, also found to have Covid, to continue Coumadin for Afib. INR this AM 1.4. No bleeding noted.  PTA warfarin regimen: Alternating 7.5mg  with 6mg . Last dose PTA 7.5mg  on 1/2.   Goal of Therapy:  INR 2-3   Plan:  Repeat warfarin 7.5mg  PO x 1 today Daily INR  Salome Arnt, PharmD, BCPS Clinical Pharmacist Please see AMION for all pharmacy numbers 06/06/2021 8:25 AM

## 2021-06-06 NOTE — Progress Notes (Signed)
Patient ID: Monique Henderson, female   DOB: [birth year], 71 y.o.   MRN: [id number] Defied by RN patient had 5 beats of nonsustained V. tach, she is asymptomatic, was walking to the restroom, will add magnesium to morning labs, she is already on beta-blockers. Phillips Climes MD

## 2021-06-06 NOTE — Progress Notes (Signed)
Pt receives out-pt HD at [facility] on TTS. Pt arrives at 11:40 for 12:00 chair time. Spoke to [place] at clinic whose states clinic is aware pt is covid positive. Pt's days/times will remain the same at d/c. Pt will need to call clinic when she arrives and they will escort pt to treatment area. Pt will not be able to sit in the lobby area due to covid diagnosis. Will assist as needed.  Melven Sartorius Renal Navigator [phone]

## 2021-06-07 ENCOUNTER — Inpatient Hospital Stay (HOSPITAL_COMMUNITY): Payer: Medicare Other

## 2021-06-07 LAB — CBC
HCT: 25.3 % — ABNORMAL LOW (ref 36.0–46.0)
Hemoglobin: 7.8 g/dL — ABNORMAL LOW (ref 12.0–15.0)
MCH: 28.5 pg (ref 26.0–34.0)
MCHC: 30.8 g/dL (ref 30.0–36.0)
MCV: 92.3 fL (ref 80.0–100.0)
Platelets: 243 10*3/uL (ref 150–400)
RBC: 2.74 MIL/uL — ABNORMAL LOW (ref 3.87–5.11)
RDW: 19.3 % — ABNORMAL HIGH (ref 11.5–15.5)
WBC: 4.6 10*3/uL (ref 4.0–10.5)
nRBC: 0 % (ref 0.0–0.2)

## 2021-06-07 LAB — BLOOD GAS, ARTERIAL
Acid-Base Excess: 1.1 mmol/L (ref 0.0–2.0)
Bicarbonate: 25.4 mmol/L (ref 20.0–28.0)
Drawn by: 24610
FIO2: 24
O2 Saturation: 82.7 %
Patient temperature: 39.1
pCO2 arterial: 46.9 mmHg (ref 32.0–48.0)
pH, Arterial: 7.364 (ref 7.350–7.450)
pO2, Arterial: 57.5 mmHg — ABNORMAL LOW (ref 83.0–108.0)

## 2021-06-07 LAB — BASIC METABOLIC PANEL
Anion gap: 20 — ABNORMAL HIGH (ref 5–15)
BUN: 123 mg/dL — ABNORMAL HIGH (ref 8–23)
CO2: 19 mmol/L — ABNORMAL LOW (ref 22–32)
Calcium: 7.7 mg/dL — ABNORMAL LOW (ref 8.9–10.3)
Chloride: 97 mmol/L — ABNORMAL LOW (ref 98–111)
Creatinine, Ser: 18.2 mg/dL — ABNORMAL HIGH (ref 0.44–1.00)
GFR, Estimated: 2 mL/min — ABNORMAL LOW (ref >60)
Glucose, Bld: 101 mg/dL — ABNORMAL HIGH (ref 70–99)
Potassium: 5.6 mmol/L — ABNORMAL HIGH (ref 3.5–5.1)
Sodium: 136 mmol/L (ref 135–145)

## 2021-06-07 LAB — PHOSPHORUS: Phosphorus: 11.2 mg/dL — ABNORMAL HIGH (ref 2.5–4.6)

## 2021-06-07 LAB — PROCALCITONIN: Procalcitonin: 1.97 ng/mL

## 2021-06-07 LAB — LACTIC ACID, PLASMA
Lactic Acid, Venous: 0.8 mmol/L (ref 0.5–1.9)
Lactic Acid, Venous: 0.8 mmol/L (ref 0.5–1.9)

## 2021-06-07 LAB — PROTIME-INR
INR: 1.3 — ABNORMAL HIGH (ref 0.8–1.2)
Prothrombin Time: 16.2 seconds — ABNORMAL HIGH (ref 11.4–15.2)

## 2021-06-07 LAB — GLUCOSE, CAPILLARY: Glucose-Capillary: 115 mg/dL — ABNORMAL HIGH (ref 70–99)

## 2021-06-07 LAB — MAGNESIUM: Magnesium: 2.6 mg/dL — ABNORMAL HIGH (ref 1.7–2.4)

## 2021-06-07 MED ORDER — WARFARIN SODIUM 5 MG PO TABS
10.0000 mg | ORAL_TABLET | Freq: Once | ORAL | Status: AC
  Administered 2021-06-07: 10 mg via ORAL
  Filled 2021-06-07: qty 2

## 2021-06-07 MED ORDER — CHLORHEXIDINE GLUCONATE CLOTH 2 % EX PADS
6.0000 | MEDICATED_PAD | Freq: Every day | CUTANEOUS | Status: DC
  Administered 2021-06-07 – 2021-06-08 (×2): 6 via TOPICAL

## 2021-06-07 MED ORDER — ACETAMINOPHEN 10 MG/ML IV SOLN: 1000.0000 mg | Freq: Four times a day (QID) | INTRAVENOUS | Status: DC

## 2021-06-07 MED ORDER — NALOXONE HCL 0.4 MG/ML IJ SOLN
0.4000 mg | INTRAMUSCULAR | Status: DC | PRN
  Administered 2021-06-07: 0.4 mg via INTRAVENOUS
  Filled 2021-06-07: qty 1

## 2021-06-07 MED ORDER — ACETAMINOPHEN 10 MG/ML IV SOLN
1000.0000 mg | Freq: Four times a day (QID) | INTRAVENOUS | Status: AC
  Administered 2021-06-07 (×2): 1000 mg via INTRAVENOUS
  Filled 2021-06-07 (×2): qty 100

## 2021-06-07 MED ORDER — DARBEPOETIN ALFA 150 MCG/0.3ML IJ SOSY
150.0000 ug | PREFILLED_SYRINGE | Freq: Once | INTRAMUSCULAR | Status: AC
  Administered 2021-06-07: 150 ug via SUBCUTANEOUS
  Filled 2021-06-07 (×2): qty 0.3

## 2021-06-07 MED ORDER — SODIUM ZIRCONIUM CYCLOSILICATE 10 G PO PACK
10.0000 g | PACK | Freq: Once | ORAL | Status: AC
  Administered 2021-06-07: 10 g via ORAL
  Filled 2021-06-07: qty 1

## 2021-06-07 NOTE — Plan of Care (Signed)
  Problem: Education: Goal: Knowledge of General Education information will improve Description: Including pain rating scale, medication(s)/side effects and non-pharmacologic comfort measures Outcome: Progressing   Problem: Health Behavior/Discharge Planning: Goal: Ability to manage health-related needs will improve Outcome: Progressing   Problem: Clinical Measurements: Goal: Ability to maintain clinical measurements within normal limits will improve Outcome: Progressing Goal: Will remain free from infection Outcome: Progressing Goal: Diagnostic test results will improve Outcome: Progressing   Problem: Pain Managment: Goal: General experience of comfort will improve Outcome: Progressing   Problem: Safety: Goal: Ability to remain free from injury will improve Outcome: Progressing   Problem: Skin Integrity: Goal: Risk for impaired skin integrity will decrease Outcome: Progressing   

## 2021-06-07 NOTE — Progress Notes (Signed)
[facility] Progress Note  Name: Monique Henderson MRN: [id number] DOB: [birth year]  Chief Complaint:  Shortness of breath  Subjective:  HD overnight on 1/7 early AM.  She had just 2 kg uf.  Earlier in the night I had spoken with overnight HD RN re: raising initial goal of 2.5-3 by 0.5 kg (to 3-3.5 kg) and was not alerted of any issues with HD. Hypotension one reading charted which may have limited UF.  On my arrival this am patient was connected to oxygen canister under the bed which appeared to have run out.  Wall oxygen set at 2 liters but not connected to patient.  Got back from HD a couple of hours ago per nursing. I connected her oxygen.   Review of systems:   Reports shortness of breath is better  Denies chest pain  Denies n/v  ---------------- Background on consult:  Monique Henderson is a 71 y.o. female with a history of ESRD, HTN, COPD, and hep C s/p treatment who presented to the hospital with shortness of breath.  She was found to have covid.  She has been on up to 4 liters oxygen. She has been weaned to 3 liters per nursing.  Nephrology is consulted for assistance with management of hemodialysis.  She states that she has been going to treatment however the last tx I see at her center was 05/31/21.  She is charted as having chest pain but denied any to me.  She states that she doesn't use oxygen at home.   Intake/Output Summary (Last 24 hours) at 06/07/2021 0906 Last data filed at 06/07/2021 0551 Gross per 24 hour  Intake 460 ml  Output 2400 ml  Net -1940 ml    Vitals:  Vitals:   06/07/21 0521 06/07/21 0551 06/07/21 0627 06/07/21 0749  BP: (!) 159/122 (!) 198/177 (!) 187/87 (!) 164/86  Pulse: 68 73 75 71  Resp: 18 17 18 17   Temp:  98.4 F (36.9 C) 99.3 F (37.4 C) 97.7 F (36.5 C)  TempSrc:  Oral Oral Oral  SpO2: 100% 97% 100% 100%     Physical Exam:  General: elderly female in bed in NAD  HEENT: NCAT Eyes: EOMI sclera anicteric Neck: supple  trachea midline Heart; RRR Lungs: crackles but unlabored; on supp oxygen (set at 2 liters) Abdomen: soft/nt/nd Extremities: no edema; no cyanosis or clubbing Skin: no rash on extremities exposed Psych no anxiety or agitation Neuro - sleepy today. Oriented to person but will not give year.  Says "yes" twice when I ask if she can tell me the year Access RIJ tunn catheter   Medications reviewed   Labs:  BMP Latest Ref Rng & Units 06/07/2021 06/06/2021 06/05/2021  Glucose 70 - 99 mg/dL 101(H) 126(H) 105(H)  BUN 8 - 23 mg/dL 123(H) 110(H) 77(H)  Creatinine 0.44 - 1.00 mg/dL 18.20(H) 17.55(H) 15.26(H)  BUN/Creat Ratio 12 - 28 - - -  Sodium 135 - 145 mmol/L 136 135 137  Potassium 3.5 - 5.1 mmol/L 5.6(H) 5.1 5.4(H)  Chloride 98 - 111 mmol/L 97(L) 96(L) 99  CO2 22 - 32 mmol/L 19(L) 21(L) 23  Calcium 8.9 - 10.3 mg/dL 7.7(L) 8.0(L) 8.3(L)   Outpatient dialysis prescription:  [facility]  TTS  3 hours 45 min 2 K / 2 Ca EDW 44.5 kg (last post was 44.6 kg on 05/31/21) BF 400, auto 1.5 for DF Tunn catheter Hectorol 3 mcg three times a week  On venofer 100 mg regimen No heparin (  heparin catheter lock permissible) Mircera 60 mcg every 2 weeks - last HB 8.2 on 12/29; last mircera was at 50 mcg on 05/22/21   Assessment/Plan:   # ESRD - HD per TTS schedule normally and got HD earlier this am/overnight - plan for additional HD later tonight vs tomorrow as staffing permits  - renal diet is ordered - hyperkalemia this AM was pre-HD - lokelma once   # Covid PNA  - Therapies per primary team  - she is on remdesivir - will optimize volume status with HD    # Acute hypoxic resp failure  - 2/2 covid as well as overload  - HD as above and covid management per primary    # HTN  - optimize volume status with HD - there are no weights charted this hospitalization - daily weights   # anemia of CKD  - transfusion per primary team parameters  - will increase ESA - start aranesp 150 mcg weekly -  missed the dose yesterday 1/5 and 1/6 - give today.  I have ordered one time dose not tied to HD.    # Metabolic bone disease  - continue hectorol and resumed home velphoro. Marked hyperphosphatemia  - HD as above - renal panel daily  Disposition - HD here again tonight vs. Tomorrow then stable for discharge from a renal standpoint if more alert and oxygen requirement minimal or resolved   Claudia Desanctis, MD 06/07/2021 9:41 AM

## 2021-06-07 NOTE — Progress Notes (Signed)
PROGRESS NOTE  Monique Henderson VZD:[id number] DOB: Mar 12, 1951 DOA: 06/04/2021 PCP: System, Provider Not In   LOS: 2 days   Brief Narrative / Interim history: 71 year old female with ESRD on HD, TTS, comes in with shortness of breath over the last 24 hours.  He has missed dialysis the day prior to admission, patient states due to not feeling well but niece tells me because she was busy.  Imaging showed patency hazy airspace disease in the lungs bilaterally, suggesting edema, less likely infiltrate. She also tested positive for covid  Subjective / 24h Interval events: Seen after coming from HD, lethargic  Assessment & Plan: Principal Problem Acute hypoxic respiratory failure - possibly multifactorial, unclear with related to missed dialysis versus Covid.  She was admitted on Wednesday and unfortunately was not dialyzed until Saturday morning early hours.  Still hypoxic requiring 2 L this morning.  To be dialyzed again tonight  Active Problems Lethargy, acute metabolic encephalopathy-I wonder whether uremia plays a role, seen post HD which happened in the middle of the night, CT scan done this morning negative for acute findings.  Continue to closely monitor mental status.  He received a dose of oxycodone last night, now discontinued  COVID-19-we will repeat chest x-ray after dialysis and assess clinically, for now continue Remdesivir.  Hold on repeating chest x-ray given the fact that she missed now to dialysis sessions and only made up one  Essential hypertension-continue clonidine, amlodipine, hydralazine, losartan, metoprolol, torsemide.  Blood pressure still high but she does need dialysis  Hyperlipidemia-continue statin  History of A. fib-continue beta-blockers, Coumadin  Anemia from renal disease-monitor hemoglobin  Acute on chronic diastolic CHF-most recent 2D echo in April 2022 showed normal EF and grade 2 diastolic dysfunction.  Currently fluid overloaded due to missed  dialysis  Scheduled Meds:  amLODipine  10 mg Oral Daily   Chlorhexidine Gluconate Cloth  6 each Topical Q0600   Chlorhexidine Gluconate Cloth  6 each Topical Q0600   cloNIDine  0.3 mg Transdermal Q Sat   hydrALAZINE  50 mg Oral TID   losartan  100 mg Oral Daily   metoprolol tartrate  75 mg Oral BID   pregabalin  100 mg Oral QHS   rosuvastatin  10 mg Oral Daily   sucroferric oxyhydroxide  1,000 mg Oral TID WC   torsemide  40 mg Oral Daily   warfarin  10 mg Oral ONCE-1600   Warfarin - Pharmacist Dosing Inpatient   Does not apply q1600   Continuous Infusions:  remdesivir 100 mg in NS 100 mL 100 mg (06/07/21 0946)   PRN Meds:.acetaminophen, albuterol, guaiFENesin-dextromethorphan, labetalol, mirtazapine, oxyCODONE  Diet Orders (From admission, onward)     Start     Ordered   06/05/21 0143  Diet renal with fluid restriction Fluid restriction: 1200 mL Fluid; Room service appropriate? Yes; Fluid consistency: Thin  Diet effective now       Question Answer Comment  Fluid restriction: 1200 mL Fluid   Room service appropriate? Yes   Fluid consistency: Thin      06/05/21 0144            DVT prophylaxis:  warfarin (COUMADIN) tablet 10 mg     Code Status: Full Code  Family Communication: No family at bedside  Status is: Inpatient  Remains inpatient appropriate because: Persistent symptoms, hypoxia   Level of care: Telemetry Medical  Consultants:  Nephrology   Procedures:  none  Microbiology  none  Antimicrobials: Remdesivir    Objective: Vitals:  06/07/21 0855 06/07/21 0957 06/07/21 1045 06/07/21 1221  BP:      Pulse:      Resp:      Temp:      TempSrc:      SpO2: 100%  100% 100%  Weight:  49.4 kg      Intake/Output Summary (Last 24 hours) at 06/07/2021 1250 Last data filed at 06/07/2021 0551 Gross per 24 hour  Intake 460 ml  Output 2400 ml  Net -1940 ml   Filed Weights   06/07/21 0957  Weight: 49.4 kg    Examination:  Constitutional:  Lethargic Eyes: Anicteric ENMT: Moist mucous membranes Neck: normal, supple Respiratory: Diminished at the bases but no significant wheezing Cardiovascular: Regular rate and rhythm, no murmurs Abdomen: Soft, NT, ND, bowel sounds positive Musculoskeletal: no clubbing / cyanosis.  Skin: No rashes seen Neurologic: Nonfocal  Data Reviewed: I have independently reviewed following labs and imaging studies   CBC: Recent Labs  Lab 06/04/21 2112 06/06/21 1347 06/07/21 0127  WBC 4.6 3.7* 4.6  NEUTROABS 3.4  --   --   HGB 7.7* 8.1* 7.8*  HCT 24.6* 26.4* 25.3*  MCV 93.9 94.0 92.3  PLT 255 262 086    Basic Metabolic Panel: Recent Labs  Lab 06/04/21 2112 06/05/21 0626 06/06/21 1347 06/07/21 0127  NA 139 137 135 136  K 4.6 5.4* 5.1 5.6*  CL 101 99 96* 97*  CO2 23 23 21* 19*  GLUCOSE 101* 105* 126* 101*  BUN 72* 77* 110* 123*  CREATININE 14.32* 15.26* 17.55* 18.20*  CALCIUM 8.6* 8.3* 8.0* 7.7*  MG  --   --   --  2.6*  PHOS  --   --   --  11.2*    Liver Function Tests: Recent Labs  Lab 06/04/21 2112  AST 31  ALT 19  ALKPHOS 68  BILITOT 0.8  PROT 6.4*  ALBUMIN 3.4*    Coagulation Profile: Recent Labs  Lab 06/05/21 0625 06/06/21 0500 06/07/21 0127  INR 1.3* 1.4* 1.3*    HbA1C: No results for input(s): HGBA1C in the last 72 hours. CBG: No results for input(s): GLUCAP in the last 168 hours.  Recent Results (from the past 240 hour(s))  Resp Panel by RT-PCR (Flu A&B, Covid) Nasopharyngeal Swab     Status: Abnormal   Collection Time: 06/05/21  2:03 AM   Specimen: Nasopharyngeal Swab; Nasopharyngeal(NP) swabs in vial transport medium  Result Value Ref Range Status   SARS Coronavirus 2 by RT PCR POSITIVE (A) NEGATIVE Final    Comment: (NOTE) SARS-CoV-2 target nucleic acids are DETECTED.  The SARS-CoV-2 RNA is generally detectable in upper respiratory specimens during the acute phase of infection. Positive results are indicative of the presence of the  identified virus, but do not rule out bacterial infection or co-infection with other pathogens not detected by the test. Clinical correlation with patient history and other diagnostic information is necessary to determine patient infection status. The expected result is Negative.  Fact Sheet for Patients: EntrepreneurPulse.com.au  Fact Sheet for Healthcare Providers: IncredibleEmployment.be  This test is not yet approved or cleared by the [place] FDA and  has been authorized for detection and/or diagnosis of SARS-CoV-2 by FDA under an Emergency Use Authorization (EUA).  This EUA will remain in effect (meaning this test can be used) for the duration of  the COVID-19 declaration under Section 564(b)(1) of the A ct, 21 U.S.C. section 360bbb-3(b)(1), unless the authorization is terminated or revoked sooner.  Influenza A by PCR NEGATIVE NEGATIVE Final   Influenza B by PCR NEGATIVE NEGATIVE Final    Comment: (NOTE) The Xpert Xpress SARS-CoV-2/FLU/RSV plus assay is intended as an aid in the diagnosis of influenza from Nasopharyngeal swab specimens and should not be used as a sole basis for treatment. Nasal washings and aspirates are unacceptable for Xpert Xpress SARS-CoV-2/FLU/RSV testing.  Fact Sheet for Patients: EntrepreneurPulse.com.au  Fact Sheet for Healthcare Providers: IncredibleEmployment.be  This test is not yet approved or cleared by the [place] FDA and has been authorized for detection and/or diagnosis of SARS-CoV-2 by FDA under an Emergency Use Authorization (EUA). This EUA will remain in effect (meaning this test can be used) for the duration of the COVID-19 declaration under Section 564(b)(1) of the Act, 21 U.S.C. section 360bbb-3(b)(1), unless the authorization is terminated or revoked.  Performed at [facility], [place] [address] [place], [place] [address]        Radiology Studies: CT HEAD WO CONTRAST (5MM)  Result Date: 06/07/2021 CLINICAL DATA:  71 year old female positive COVID-19. Altered mental status. EXAM: CT HEAD WITHOUT CONTRAST TECHNIQUE: Contiguous axial images were obtained from the base of the skull through the vertex without intravenous contrast. COMPARISON:  Brain MRI 05/21/2019. Head CT 05/19/2019. FINDINGS: Brain: No midline shift, ventriculomegaly, mass effect, evidence of mass lesion, intracranial hemorrhage or evidence of cortically based acute infarction. Patchy and confluent bilateral white matter hypodensity appears not significantly changed since 2020. Chronic heterogeneity in the bilateral deep gray matter nuclei most pronounced in the left caudate. No cortical encephalomalacia identified. Vascular: Calcified atherosclerosis at the skull base. No suspicious intracranial vascular hyperdensity. Skull: Negative. Sinuses/Orbits: Visualized paranasal sinuses and mastoids are stable and well aerated. Other: Visualized orbits and scalp soft tissues are within normal limits. IMPRESSION: 1. No acute intracranial abnormality. 2. Stable non contrast CT appearance of chronic small vessel disease since 2020. Electronically Signed   By: Genevie Ann M.D.   On: 06/07/2021 12:13     Marzetta Board, MD, PhD [facility]  Between 7 am - 7 pm I am available, please contact me via Amion (for emergencies) or Securechat (non urgent messages)  Between 7 pm - 7 am I am not available, please contact night coverage MD/APP via Amion

## 2021-06-07 NOTE — Progress Notes (Signed)
While doing ABG on 2 lpm, noted sat 89-90%.  S/p ABG stick, increased [place] flow to 4 LPM, RN notified and aware.

## 2021-06-07 NOTE — Progress Notes (Signed)
°   06/07/21 1420  Assess: MEWS Score  Temp (!) 102.5 F (39.2 C)  BP 135/72  Pulse Rate 70  Resp (!) 21  SpO2 100 %  O2 Device Nasal Cannula  O2 Flow Rate (L/min) 2 L/min  Assess: MEWS Score  MEWS Temp 2  MEWS Systolic 0  MEWS Pulse 0  MEWS RR 1  MEWS LOC 0  MEWS Score 3  MEWS Score Color Yellow  Assess: if the MEWS score is Yellow or Red  Were vital signs taken at a resting state? Yes  Focused Assessment Change from prior assessment (see assessment flowsheet)  Early Detection of Sepsis Score *See Row Information* Medium  MEWS guidelines implemented *See Row Information* Yes  Treat  MEWS Interventions Escalated (See documentation below)  Pain Scale 0-10  Pain Score 0  Faces Pain Scale 0  Take Vital Signs  Increase Vital Sign Frequency  Yellow: Q 2hr X 2 then Q 4hr X 2, if remains yellow, continue Q 4hrs  Escalate  MEWS: Escalate Yellow: discuss with charge nurse/RN and consider discussing with provider and RRT  Notify: Charge Nurse/RN  Name of Charge Nurse/RN Notified Bev Amisial, RN  Date Charge Nurse/RN Notified 06/07/21  Time Charge Nurse/RN Notified 4825  Notify: Provider  Provider Name/Title Marzetta Board, MD  Date Provider Notified 06/07/21  Time Provider Notified 1421  Notification Type Page  Notification Reason Change in status  Provider response See new orders  Date of Provider Response 06/07/21  Time of Provider Response 0037  Notify: Rapid Response  Name of Rapid Response RN Notified Saralyn Pilar, RN  Date Rapid Response Notified 06/07/21  Time Rapid Response Notified 0488  Document  Progress note created (see row info) Yes

## 2021-06-07 NOTE — Significant Event (Incomplete)
Rapid Response Event Note   Reason for Call :  Decreased LOC  Initial Focused Assessment:  I was called by the RN with concerns that the patient is more sleepy today than her normal. The patient is lying in bed and would awaken to my voice, but would fall back asleep. She is disoriented x 3. She had received HD today and they are planning on HD again tonight. Her BUN is 123.   The patient is also warm to the touch. Rectal temp 102.5. MD ordered a head CT which was negative for an acute event.    Interventions:  ABG- 7.36, 46.6, 57.5 (pO2), and 25.4 Temp taken 102.5 rectal and IV tylenol given. Narcan 0.4 mg given because patient had received pain medication but there was no improvement in her mental status.   Plan of Care:  Patient will receive HD again this evening to hopefully help with her encephalopathy.    Event Summary:   MD Notified: Dr. Cruzita Lederer Call Time: [place] Time: 3009 End Time: [place], RN

## 2021-06-07 NOTE — Progress Notes (Signed)
[facility] for Coumadin Indication: atrial fibrillation  Vital Signs: Temp: 97.7 F (36.5 C) (01/07 0749) Temp Source: Oral (01/07 0749) BP: 164/86 (01/07 0749) Pulse Rate: 71 (01/07 0749)  Labs: Recent Labs    06/04/21 2112 06/05/21 0625 06/05/21 0626 06/06/21 0500 06/06/21 1347 06/07/21 0127  HGB 7.7*  --   --   --  8.1* 7.8*  HCT 24.6*  --   --   --  26.4* 25.3*  PLT 255  --   --   --  262 243  LABPROT  --  16.4*  --  17.1*  --  16.2*  INR  --  1.3*  --  1.4*  --  1.3*  CREATININE 14.32*  --  15.26*  --  17.55* 18.20*  TROPONINIHS 158*  --  186*  --   --   --     Assessment: 71yo female c/o chills, SOB, and cough, admitted for acute pulmonary edema d/t missing HD, also found to have Covid, to continue Coumadin for Afib. INR this AM 1.3. No bleeding noted.  PTA warfarin regimen: Alternating 7.5mg  with 6mg . Last dose PTA 7.5mg  on 1/2.   Goal of Therapy:  INR 2-3   Plan:  Warfarin 10mg  PO x 1 today Daily INR Monitor for s/sx of bleed   Cephus Slater, PharmD, [facility] Pharmacy Resident [phone] 06/07/2021 7:55 AM

## 2021-06-07 NOTE — Plan of Care (Signed)
°  Problem: Education: Goal: Knowledge of General Education information will improve Description: Including pain rating scale, medication(s)/side effects and non-pharmacologic comfort measures Outcome: Not Progressing   Problem: Health Behavior/Discharge Planning: Goal: Ability to manage health-related needs will improve Outcome: Not Progressing   Problem: Clinical Measurements: Goal: Ability to maintain clinical measurements within normal limits will improve Outcome: Not Progressing Goal: Will remain free from infection Outcome: Not Progressing Goal: Diagnostic test results will improve Outcome: Not Progressing Goal: Respiratory complications will improve Outcome: Not Progressing Goal: Cardiovascular complication will be avoided Outcome: Not Progressing   Problem: Activity: Goal: Risk for activity intolerance will decrease Outcome: Not Progressing   Problem: Nutrition: Goal: Adequate nutrition will be maintained Outcome: Not Progressing   Problem: Coping: Goal: Level of anxiety will decrease Outcome: Not Progressing   Problem: Elimination: Goal: Will not experience complications related to bowel motility Outcome: Not Progressing Goal: Will not experience complications related to urinary retention Outcome: Not Progressing   Problem: Pain Managment: Goal: General experience of comfort will improve Outcome: Not Progressing   Problem: Safety: Goal: Ability to remain free from injury will improve Outcome: Not Progressing   Problem: Skin Integrity: Goal: Risk for impaired skin integrity will decrease Outcome: Not Progressing   Problem: Education: Goal: Knowledge of risk factors and measures for prevention of condition will improve Outcome: Not Progressing   Problem: Coping: Goal: Psychosocial and spiritual needs will be supported Outcome: Not Progressing   Problem: Respiratory: Goal: Will maintain a patent airway Outcome: Not Progressing Goal: Complications  related to the disease process, condition or treatment will be avoided or minimized Outcome: Not Progressing   Problem: Education: Goal: Knowledge of disease and its progression will improve Outcome: Not Progressing Goal: Individualized Educational Video(s) Outcome: Not Progressing   Problem: Fluid Volume: Goal: Compliance with measures to maintain balanced fluid volume will improve Outcome: Not Progressing   Problem: Health Behavior/Discharge Planning: Goal: Ability to manage health-related needs will improve Outcome: Not Progressing   Problem: Nutritional: Goal: Ability to make healthy dietary choices will improve Outcome: Not Progressing   Problem: Clinical Measurements: Goal: Complications related to the disease process, condition or treatment will be avoided or minimized Outcome: Not Progressing

## 2021-06-08 LAB — COMPREHENSIVE METABOLIC PANEL
ALT: 35 U/L (ref 0–44)
AST: 35 U/L (ref 15–41)
Albumin: 2.5 g/dL — ABNORMAL LOW (ref 3.5–5.0)
Alkaline Phosphatase: 61 U/L (ref 38–126)
Anion gap: 15 (ref 5–15)
BUN: 62 mg/dL — ABNORMAL HIGH (ref 8–23)
CO2: 22 mmol/L (ref 22–32)
Calcium: 8.2 mg/dL — ABNORMAL LOW (ref 8.9–10.3)
Chloride: 98 mmol/L (ref 98–111)
Creatinine, Ser: 11.73 mg/dL — ABNORMAL HIGH (ref 0.44–1.00)
GFR, Estimated: 3 mL/min — ABNORMAL LOW (ref >60)
Glucose, Bld: 89 mg/dL (ref 70–99)
Potassium: 4.5 mmol/L (ref 3.5–5.1)
Sodium: 135 mmol/L (ref 135–145)
Total Bilirubin: 0.6 mg/dL (ref 0.3–1.2)
Total Protein: 5.2 g/dL — ABNORMAL LOW (ref 6.5–8.1)

## 2021-06-08 LAB — CBC
HCT: 28.7 % — ABNORMAL LOW (ref 36.0–46.0)
Hemoglobin: 8.5 g/dL — ABNORMAL LOW (ref 12.0–15.0)
MCH: 28 pg (ref 26.0–34.0)
MCHC: 29.6 g/dL — ABNORMAL LOW (ref 30.0–36.0)
MCV: 94.4 fL (ref 80.0–100.0)
Platelets: 237 10*3/uL (ref 150–400)
RBC: 3.04 MIL/uL — ABNORMAL LOW (ref 3.87–5.11)
RDW: 19.7 % — ABNORMAL HIGH (ref 11.5–15.5)
WBC: 4 10*3/uL (ref 4.0–10.5)
nRBC: 0 % (ref 0.0–0.2)

## 2021-06-08 LAB — RENAL FUNCTION PANEL
Albumin: 2.5 g/dL — ABNORMAL LOW (ref 3.5–5.0)
Anion gap: 15 (ref 5–15)
BUN: 62 mg/dL — ABNORMAL HIGH (ref 8–23)
CO2: 23 mmol/L (ref 22–32)
Calcium: 8.2 mg/dL — ABNORMAL LOW (ref 8.9–10.3)
Chloride: 98 mmol/L (ref 98–111)
Creatinine, Ser: 11.7 mg/dL — ABNORMAL HIGH (ref 0.44–1.00)
GFR, Estimated: 3 mL/min — ABNORMAL LOW (ref >60)
Glucose, Bld: 87 mg/dL (ref 70–99)
Phosphorus: 9.3 mg/dL — ABNORMAL HIGH (ref 2.5–4.6)
Potassium: 4.6 mmol/L (ref 3.5–5.1)
Sodium: 136 mmol/L (ref 135–145)

## 2021-06-08 LAB — PROTIME-INR
INR: 1.5 — ABNORMAL HIGH (ref 0.8–1.2)
Prothrombin Time: 17.7 seconds — ABNORMAL HIGH (ref 11.4–15.2)

## 2021-06-08 LAB — MAGNESIUM: Magnesium: 2.2 mg/dL (ref 1.7–2.4)

## 2021-06-08 MED ORDER — WARFARIN SODIUM 7.5 MG PO TABS
7.5000 mg | ORAL_TABLET | Freq: Once | ORAL | Status: AC
  Administered 2021-06-08: 7.5 mg via ORAL
  Filled 2021-06-08 (×2): qty 1

## 2021-06-08 MED ORDER — CLONIDINE HCL 0.1 MG/24HR TD PTWK: 0.1000 mg | MEDICATED_PATCH | TRANSDERMAL | Status: DC

## 2021-06-08 MED ORDER — CLONIDINE HCL 0.1 MG/24HR TD PTWK
0.1000 mg | MEDICATED_PATCH | TRANSDERMAL | Status: DC
  Administered 2021-06-08: 0.1 mg via TRANSDERMAL
  Filled 2021-06-08: qty 1

## 2021-06-08 MED ORDER — DARBEPOETIN ALFA 150 MCG/0.3ML IJ SOSY: 150.0000 ug | PREFILLED_SYRINGE | INTRAMUSCULAR | Status: DC

## 2021-06-08 NOTE — Progress Notes (Signed)
PROGRESS NOTE  Monique Henderson WPY:[id number] DOB: 04-28-1951 DOA: 06/04/2021 PCP: System, Provider Not In   LOS: 3 days   Brief Narrative / Interim history: 71 year old female with ESRD on HD, TTS, comes in with shortness of breath over the last 24 hours.  He has missed dialysis the day prior to admission, patient states due to not feeling well but niece tells me because she was busy.  Imaging showed patency hazy airspace disease in the lungs bilaterally, suggesting edema, less likely infiltrate. She also tested positive for covid  Subjective / 24h Interval events: Alert this morning, complains of a cough but no shortness of breath  Assessment & Plan: Principal Problem Acute hypoxic respiratory failure - possibly multifactorial, unclear with related to missed dialysis versus Covid.  She was admitted on Wednesday and unfortunately was not dialyzed until Saturday morning early hours.  Still hypoxic requiring 2 L, she will be dialyzed again hopefully later today  Active Problems Lethargy, acute metabolic encephalopathy-I wonder whether uremia plays a role, seen post HD which happened in the middle of the night, CT scan done negative for acute findings.  Continue to closely monitor mental status.  Discontinued her narcotics.  Currently alert  COVID-19-chest x-ray done again 1/7 shows significant improvement after HD consistent with fluid overload playing a major role into her hypoxia.  She was started on Remdesivir and will now complete 5 days as she is high risk  Essential hypertension-normotensive this morning, also bradycardic.  Decrease clonidine, stop metoprolol, hold rest of amlodipine, hydralazine, losartan, torsemide.  She is to do dialysis again today  Hyperlipidemia-continue statin  History of A. fib-continue Coumadin  Anemia from renal disease-monitor hemoglobin, stable  Acute on chronic diastolic CHF-most recent 2D echo in April 2022 showed normal EF and grade 2 diastolic  dysfunction.  Currently fluid overloaded due to missed dialysis  Scheduled Meds:  amLODipine  10 mg Oral Daily   Chlorhexidine Gluconate Cloth  6 each Topical Q0600   Chlorhexidine Gluconate Cloth  6 each Topical Q0600   cloNIDine  0.1 mg Transdermal Q Sat   [START ON 06/14/2021] darbepoetin (ARANESP) injection - NON-DIALYSIS  150 mcg Subcutaneous Q Sat-1800   hydrALAZINE  50 mg Oral TID   losartan  100 mg Oral Daily   pregabalin  100 mg Oral QHS   rosuvastatin  10 mg Oral Daily   sucroferric oxyhydroxide  1,000 mg Oral TID WC   torsemide  40 mg Oral Daily   warfarin  7.5 mg Oral ONCE-1600   Warfarin - Pharmacist Dosing Inpatient   Does not apply q1600   Continuous Infusions:  acetaminophen 1,000 mg (06/07/21 2152)   remdesivir 100 mg in NS 100 mL 100 mg (06/08/21 0957)   PRN Meds:.acetaminophen, albuterol, guaiFENesin-dextromethorphan, labetalol, mirtazapine, naLOXone (NARCAN)  injection  Diet Orders (From admission, onward)     Start     Ordered   06/05/21 0143  Diet renal with fluid restriction Fluid restriction: 1200 mL Fluid; Room service appropriate? Yes; Fluid consistency: Thin  Diet effective now       Question Answer Comment  Fluid restriction: 1200 mL Fluid   Room service appropriate? Yes   Fluid consistency: Thin      06/05/21 0144            DVT prophylaxis:  warfarin (COUMADIN) tablet 7.5 mg     Code Status: Full Code  Family Communication: No family at bedside  Status is: Inpatient  Remains inpatient appropriate because: Persistent symptoms, hypoxia  Level of care: Telemetry Medical  Consultants:  Nephrology   Procedures:  none  Microbiology  none  Antimicrobials: Remdesivir    Objective: Vitals:   06/08/21 0212 06/08/21 0630 06/08/21 0826 06/08/21 0926  BP: 96/60 112/63 113/65 125/67  Pulse: (!) 44 (!) 47 (!) 51 (!) 52  Resp: 18 18 17 18   Temp: 98.1 F (36.7 C) 98.3 F (36.8 C) 98.3 F (36.8 C) 98.2 F (36.8 C)  TempSrc:  Oral Oral Oral Oral  SpO2: 100% 100% 100% 100%  Weight:        Intake/Output Summary (Last 24 hours) at 06/08/2021 1154 Last data filed at 06/07/2021 2300 Gross per 24 hour  Intake 360 ml  Output --  Net 360 ml    Filed Weights   06/07/21 0957  Weight: 49.4 kg    Examination:  Constitutional: No distress Eyes: No scleral icterus ENMT: Moist mucous membranes Neck: normal, supple Respiratory: No wheezing Cardiovascular: Regular rate and rhythm, no murmurs Abdomen: Soft, NT, ND, bowel sounds positive Musculoskeletal: no clubbing / cyanosis.  Skin: No rashes seen Neurologic: No focal deficits  Data Reviewed: I have independently reviewed following labs and imaging studies   CBC: Recent Labs  Lab 06/04/21 2112 06/06/21 1347 06/07/21 0127 06/08/21 0938  WBC 4.6 3.7* 4.6 4.0  NEUTROABS 3.4  --   --   --   HGB 7.7* 8.1* 7.8* 8.5*  HCT 24.6* 26.4* 25.3* 28.7*  MCV 93.9 94.0 92.3 94.4  PLT 255 262 243 761    Basic Metabolic Panel: Recent Labs  Lab 06/04/21 2112 06/05/21 0626 06/06/21 1347 06/07/21 0127 06/08/21 0938  NA 139 137 135 136 135   136  K 4.6 5.4* 5.1 5.6* 4.5   4.6  CL 101 99 96* 97* 98   98  CO2 23 23 21* 19* 22   23  GLUCOSE 101* 105* 126* 101* 89   87  BUN 72* 77* 110* 123* 62*   62*  CREATININE 14.32* 15.26* 17.55* 18.20* 11.73*   11.70*  CALCIUM 8.6* 8.3* 8.0* 7.7* 8.2*   8.2*  MG  --   --   --  2.6* 2.2  PHOS  --   --   --  11.2* 9.3*    Liver Function Tests: Recent Labs  Lab 06/04/21 2112 06/08/21 0938  AST 31 35  ALT 19 35  ALKPHOS 68 61  BILITOT 0.8 0.6  PROT 6.4* 5.2*  ALBUMIN 3.4* 2.5*   2.5*    Coagulation Profile: Recent Labs  Lab 06/05/21 0625 06/06/21 0500 06/07/21 0127 06/08/21 0938  INR 1.3* 1.4* 1.3* 1.5*    HbA1C: No results for input(s): HGBA1C in the last 72 hours. CBG: Recent Labs  Lab 06/07/21 2152  GLUCAP 115*    Recent Results (from the past 240 hour(s))  Resp Panel by RT-PCR (Flu A&B, Covid)  Nasopharyngeal Swab     Status: Abnormal   Collection Time: 06/05/21  2:03 AM   Specimen: Nasopharyngeal Swab; Nasopharyngeal(NP) swabs in vial transport medium  Result Value Ref Range Status   SARS Coronavirus 2 by RT PCR POSITIVE (A) NEGATIVE Final    Comment: (NOTE) SARS-CoV-2 target nucleic acids are DETECTED.  The SARS-CoV-2 RNA is generally detectable in upper respiratory specimens during the acute phase of infection. Positive results are indicative of the presence of the identified virus, but do not rule out bacterial infection or co-infection with other pathogens not detected by the test. Clinical correlation with patient history and other  diagnostic information is necessary to determine patient infection status. The expected result is Negative.  Fact Sheet for Patients: EntrepreneurPulse.com.au  Fact Sheet for Healthcare Providers: IncredibleEmployment.be  This test is not yet approved or cleared by the [place] FDA and  has been authorized for detection and/or diagnosis of SARS-CoV-2 by FDA under an Emergency Use Authorization (EUA).  This EUA will remain in effect (meaning this test can be used) for the duration of  the COVID-19 declaration under Section 564(b)(1) of the A ct, 21 U.S.C. section 360bbb-3(b)(1), unless the authorization is terminated or revoked sooner.     Influenza A by PCR NEGATIVE NEGATIVE Final   Influenza B by PCR NEGATIVE NEGATIVE Final    Comment: (NOTE) The Xpert Xpress SARS-CoV-2/FLU/RSV plus assay is intended as an aid in the diagnosis of influenza from Nasopharyngeal swab specimens and should not be used as a sole basis for treatment. Nasal washings and aspirates are unacceptable for Xpert Xpress SARS-CoV-2/FLU/RSV testing.  Fact Sheet for Patients: EntrepreneurPulse.com.au  Fact Sheet for Healthcare Providers: IncredibleEmployment.be  This test is not yet  approved or cleared by the [place] FDA and has been authorized for detection and/or diagnosis of SARS-CoV-2 by FDA under an Emergency Use Authorization (EUA). This EUA will remain in effect (meaning this test can be used) for the duration of the COVID-19 declaration under Section 564(b)(1) of the Act, 21 U.S.C. section 360bbb-3(b)(1), unless the authorization is terminated or revoked.  Performed at [facility], [place] [address] [place], [place] [address]   Culture, blood (Routine X 2) w Reflex to ID Panel     Status: None (Preliminary result)   Collection Time: 06/07/21  3:20 PM   Specimen: BLOOD LEFT ARM  Result Value Ref Range Status   Specimen Description BLOOD LEFT ARM  Final   Special Requests   Final    BOTTLES DRAWN AEROBIC ONLY Blood Culture results may not be optimal due to an inadequate volume of blood received in culture bottles   Culture   Final    NO GROWTH < 24 HOURS Performed at [facility], [place] [address], [place], [place] [address]    Report Status PENDING  Incomplete  Culture, blood (Routine X 2) w Reflex to ID Panel     Status: None (Preliminary result)   Collection Time: 06/07/21  3:31 PM   Specimen: BLOOD RIGHT HAND  Result Value Ref Range Status   Specimen Description BLOOD RIGHT HAND  Final   Special Requests   Final    BOTTLES DRAWN AEROBIC ONLY Blood Culture results may not be optimal due to an inadequate volume of blood received in culture bottles   Culture   Final    NO GROWTH < 24 HOURS Performed at [facility], [place] [address] [place],  [address]    Report Status PENDING  Incomplete      Radiology Studies: CT HEAD WO CONTRAST (5MM)  Result Date: 06/07/2021 CLINICAL DATA:  71 year old female positive COVID-19. Altered mental status. EXAM: CT HEAD WITHOUT CONTRAST TECHNIQUE: Contiguous axial images were obtained from the base of the skull through the vertex without intravenous contrast. COMPARISON:  Brain MRI  05/21/2019. Head CT 05/19/2019. FINDINGS: Brain: No midline shift, ventriculomegaly, mass effect, evidence of mass lesion, intracranial hemorrhage or evidence of cortically based acute infarction. Patchy and confluent bilateral white matter hypodensity appears not significantly changed since 2020. Chronic heterogeneity in the bilateral deep gray matter nuclei most pronounced in the left  caudate. No cortical encephalomalacia identified. Vascular: Calcified atherosclerosis at the skull base. No suspicious intracranial vascular hyperdensity. Skull: Negative. Sinuses/Orbits: Visualized paranasal sinuses and mastoids are stable and well aerated. Other: Visualized orbits and scalp soft tissues are within normal limits. IMPRESSION: 1. No acute intracranial abnormality. 2. Stable non contrast CT appearance of chronic small vessel disease since 2020. Electronically Signed   By: Genevie Ann M.D.   On: 06/07/2021 12:13   DG CHEST PORT 1 VIEW  Result Date: 06/07/2021 CLINICAL DATA:  Fever.  Shortness of breath.  Follow-up exam. EXAM: PORTABLE CHEST 1 VIEW COMPARISON:  06/04/2021 and older exams.  CT, 06/05/2021. FINDINGS: Stable cardiomegaly. Right internal jugular dual lumen tunneled central venous catheter, also stable, tip in the right atrium. Lung opacities have improved. Minimal residual opacity noted in the medial lung bases. No new lung abnormalities. No pneumothorax. IMPRESSION: 1. Significant interval improvement in lung opacities consistent with improved pulmonary edema (favored) or multifocal pneumonia. Electronically Signed   By: Lajean Manes M.D.   On: 06/07/2021 16:16     Marzetta Board, MD, PhD [facility]  Between 7 am - 7 pm I am available, please contact me via Amion (for emergencies) or Securechat (non urgent messages)  Between 7 pm - 7 am I am not available, please contact night coverage MD/APP via Amion

## 2021-06-08 NOTE — Plan of Care (Signed)
°  Problem: Clinical Measurements: Goal: Will remain free from infection Outcome: Not Progressing   Problem: Clinical Measurements: Goal: Diagnostic test results will improve Outcome: Not Progressing   Problem: Clinical Measurements: Goal: Respiratory complications will improve Outcome: Not Progressing   Problem: Skin Integrity: Goal: Risk for impaired skin integrity will decrease Outcome: Not Progressing   Problem: Respiratory: Goal: Will maintain a patent airway Outcome: Not Progressing

## 2021-06-08 NOTE — Progress Notes (Signed)
ANTICOAGULATION CONSULT NOTE  Pharmacy Consult for Coumadin Indication: atrial fibrillation  Vital Signs: Temp: 98.2 F (36.8 C) (01/08 0926) Temp Source: Oral (01/08 0926) BP: 125/67 (01/08 0926) Pulse Rate: 52 (01/08 0926)  Labs: Recent Labs    06/06/21 0500 06/06/21 1347 06/06/21 1347 06/07/21 0127 06/08/21 0938  HGB  --  8.1*   < > 7.8* 8.5*  HCT  --  26.4*  --  25.3* 28.7*  PLT  --  262  --  243 237  LABPROT 17.1*  --   --  16.2* 17.7*  INR 1.4*  --   --  1.3* 1.5*  CREATININE  --  17.55*  --  18.20* 11.70*   < > = values in this interval not displayed.    Assessment: 71yo female c/o chills, SOB, and cough, admitted for acute pulmonary edema d/t missing HD, also found to have Covid, to continue Coumadin for Afib. INR this AM 1.5 which is up slightly after warfarin 10 MG x1 on 1/7 but still not at goal. No bleeding noted. Hgb 8.5 and Plt wnl.   PTA warfarin regimen: Alternating 7.5mg  with 6mg . Last dose PTA 7.5mg  on 1/2.   Goal of Therapy:  INR 2-3   Plan:  - Warfarin 7.5 MG PO x 1 today - Daily INR - Monitor for s/sx of bleed   Cephus Slater, PharmD, [facility] Pharmacy Resident [phone] 06/08/2021 11:03 AM

## 2021-06-08 NOTE — Progress Notes (Signed)
[facility] Progress Note  Name: Monique Henderson MRN: [id number] DOB: 03/04/1951  Chief Complaint:  Shortness of breath  Subjective:  HD overnight on 1/7 early AM.  She had 2 kg uf.  She feels ok today.  RN has weaned to 2 liters and is trying to wean oxygen further.    Review of systems:   Reports shortness of breath is better  chest pain when she coughs Some nausea no vomiting; just ate a little  ---------------- Background on consult:  Monique Henderson is a 71 y.o. female with a history of ESRD, HTN, COPD, and hep C s/p treatment who presented to the hospital with shortness of breath.  She was found to have covid.  She has been on up to 4 liters oxygen. She has been weaned to 3 liters per nursing.  Nephrology is consulted for assistance with management of hemodialysis.  She states that she has been going to treatment however the last tx I see at her center was 05/31/21.  She is charted as having chest pain but denied any to me.  She states that she doesn't use oxygen at home.   Intake/Output Summary (Last 24 hours) at 06/08/2021 1125 Last data filed at 06/07/2021 2300 Gross per 24 hour  Intake 360 ml  Output --  Net 360 ml    Vitals:  Vitals:   06/08/21 0212 06/08/21 0630 06/08/21 0826 06/08/21 0926  BP: 96/60 112/63 113/65 125/67  Pulse: (!) 44 (!) 47 (!) 51 (!) 52  Resp: 18 18 17 18   Temp: 98.1 F (36.7 C) 98.3 F (36.8 C) 98.3 F (36.8 C) 98.2 F (36.8 C)  TempSrc: Oral Oral Oral Oral  SpO2: 100% 100% 100% 100%  Weight:         Physical Exam:  General: elderly female in bed in NAD  HEENT: NCAT Eyes: EOMI sclera anicteric Neck: supple trachea midline Heart; RRR Lungs: crackles with normal work of breathing; on supp oxygen (at 2 liters) Abdomen: soft/nt/nd Extremities: no edema Psych no anxiety or agitation Neuro - awake and interactive and follows commands Access RIJ tunn catheter   Medications reviewed   Labs:  BMP Latest Ref Rng &  Units 06/08/2021 06/08/2021 06/07/2021  Glucose 70 - 99 mg/dL 89 87 101(H)  BUN 8 - 23 mg/dL 62(H) 62(H) 123(H)  Creatinine 0.44 - 1.00 mg/dL 11.73(H) 11.70(H) 18.20(H)  BUN/Creat Ratio 12 - 28 - - -  Sodium 135 - 145 mmol/L 135 136 136  Potassium 3.5 - 5.1 mmol/L 4.5 4.6 5.6(H)  Chloride 98 - 111 mmol/L 98 98 97(L)  CO2 22 - 32 mmol/L 22 23 19(L)  Calcium 8.9 - 10.3 mg/dL 8.2(L) 8.2(L) 7.7(L)   Outpatient dialysis prescription:  [facility]  TTS  3 hours 45 min 2 K / 2 Ca EDW 44.5 kg (last post was 44.6 kg on 05/31/21) BF 400, auto 1.5 for DF Tunn catheter Hectorol 3 mcg three times a week  On venofer 100 mg regimen No heparin (heparin catheter lock permissible) Mircera 60 mcg every 2 weeks - last HB 8.2 on 12/29; last mircera was at 50 mcg on 05/22/21   Assessment/Plan:   # ESRD - She has HD per TTS schedule normally - plan for additional HD tonight as staffing permits - renal diet is ordered   # Covid PNA  - Therapies per primary team  - she is on remdesivir - optimize volume status with HD    # Acute hypoxic resp  failure  - 2/2 covid as well as overload  - HD as above and covid management per primary    # HTN  - optimize volume status with HD - daily weights ordered   # anemia of CKD  - transfusion per primary team parameters  - increased ESA.  aranesp 150 mcg weekly started 1/7.  Weekly on sat while here   # Metabolic bone disease  - continue hectorol and resumed home velphoro. Marked hyperphosphatemia slightly improved - HD as above - renal panel daily while here  Disposition - would continue inpatient monitoring  Claudia Desanctis, MD 06/08/2021 11:44 AM

## 2021-06-08 NOTE — Progress Notes (Signed)
This nurse was notified by telemetry of Heart rate sustaining at 44-46, checked on patient, patient still somnolent but arousable then quickly drifts back to sleep. Paged Lovey Newcomer regarding EWS and sustaining HR at 70's, [employer] forces technical officer as well, Tylenol IV not given, updated Lovey Newcomer of actions.    06/08/21 0212  Assess: MEWS Score  Temp 98.1 F (36.7 C)  BP 96/60  Pulse Rate (!) 44  Resp 18  SpO2 100 %  O2 Device Nasal Cannula  O2 Flow Rate (L/min) 4 L/min  Assess: MEWS Score  MEWS Temp 0  MEWS Systolic 1  MEWS Pulse 1  MEWS RR 0  MEWS LOC 0  MEWS Score 2  MEWS Score Color Yellow  Assess: if the MEWS score is Yellow or Red  Were vital signs taken at a resting state? Yes  Focused Assessment No change from prior assessment  Early Detection of Sepsis Score *See Row Information* Medium  MEWS guidelines implemented *See Row Information* Yes  Treat  MEWS Interventions Escalated (See documentation below)  Pain Scale Faces  Faces Pain Scale 0  Take Vital Signs  Increase Vital Sign Frequency  Yellow: Q 2hr X 2 then Q 4hr X 2, if remains yellow, continue Q 4hrs  Escalate  MEWS: Escalate Yellow: discuss with charge nurse/RN and consider discussing with provider and RRT  Notify: Charge Nurse/RN  Name of Charge Nurse/RN Notified carla, RN  Date Charge Nurse/RN Notified 06/08/21  Time Charge Nurse/RN Notified 0225  Notify: Provider  Provider Name/Title Lovey Newcomer, NP  Date Provider Notified 06/08/21  Time Provider Notified [phone]  Notification Type Page  Notification Reason Other (Comment) (EWS 2, HR 4)  Provider response No new orders  Date of Provider Response 06/08/21  Time of Provider Response [phone]

## 2021-06-09 ENCOUNTER — Other Ambulatory Visit (HOSPITAL_COMMUNITY): Payer: Self-pay

## 2021-06-09 LAB — RENAL FUNCTION PANEL
Albumin: 2.8 g/dL — ABNORMAL LOW (ref 3.5–5.0)
Anion gap: 13 (ref 5–15)
BUN: 24 mg/dL — ABNORMAL HIGH (ref 8–23)
CO2: 25 mmol/L (ref 22–32)
Calcium: 8.4 mg/dL — ABNORMAL LOW (ref 8.9–10.3)
Chloride: 97 mmol/L — ABNORMAL LOW (ref 98–111)
Creatinine, Ser: 5.28 mg/dL — ABNORMAL HIGH (ref 0.44–1.00)
GFR, Estimated: 8 mL/min — ABNORMAL LOW (ref >60)
Glucose, Bld: 122 mg/dL — ABNORMAL HIGH (ref 70–99)
Phosphorus: 3.7 mg/dL (ref 2.5–4.6)
Potassium: 3.2 mmol/L — ABNORMAL LOW (ref 3.5–5.1)
Sodium: 135 mmol/L (ref 135–145)

## 2021-06-09 LAB — PROTIME-INR
INR: 1.5 — ABNORMAL HIGH (ref 0.8–1.2)
Prothrombin Time: 18.3 seconds — ABNORMAL HIGH (ref 11.4–15.2)

## 2021-06-09 MED ORDER — APIXABAN 2.5 MG PO TABS
2.5000 mg | ORAL_TABLET | Freq: Two times a day (BID) | ORAL | 0 refills | Status: DC
  Filled 2021-06-09: qty 60, 30d supply, fill #0

## 2021-06-09 MED ORDER — APIXABAN 2.5 MG PO TABS
2.5000 mg | ORAL_TABLET | Freq: Two times a day (BID) | ORAL | Status: DC
  Administered 2021-06-09: 2.5 mg via ORAL
  Filled 2021-06-09: qty 1

## 2021-06-09 MED ORDER — CLONIDINE 0.1 MG/24HR TD PTWK: 0.1000 mg | MEDICATED_PATCH | TRANSDERMAL | 12 refills | Status: DC

## 2021-06-09 NOTE — Progress Notes (Addendum)
[facility] Progress Note   Subjective: Had extra HD 06/08/2021. Net UF 2.5 liters. HD tomorrow. Seen in room. Now on RA.   Questioned by pharmacy about who is drawing her INR for coumadin. Patient has been taking coumadin, but not getting labs drawn. [facility] NO LONGER MANAGE COUMADIN. INR is checked monthly with monthly labs only .   Objective Vitals:   06/09/21 0021 06/09/21 0051 06/09/21 0057 06/09/21 0532  BP: (!) 144/56 (!) 117/58 (!) (P) 161/41 (!) 156/70  Pulse: 66 (!) 56 (P) 68 65  Resp: 20 19 (P) 20 14  Temp:    97.6 F (36.4 C)  TempSrc:    Oral  SpO2: 97% 100% (P) 100% 99%  Weight:   (P) 48.2 kg    Physical Exam General: elderly female in bed in NAD  HEENT: NCAT Eyes: EOMI sclera anicteric Neck: supple trachea midline Heart; RRR Lungs: CTAB slightly decreased in bases. No WOB. On RA.  Abdomen: soft/nt/nd Extremities: no edema Psych no anxiety or agitation Neuro - awake and interactive and follows commands Access RIJ Drsg intact.   Additional Objective Labs: Basic Metabolic Panel: Recent Labs  Lab 06/07/21 0127 06/08/21 0938 06/09/21 0128  NA 136 135   136 135  K 5.6* 4.5   4.6 3.2*  CL 97* 98   98 97*  CO2 19* 22   23 25   GLUCOSE 101* 89   87 122*  BUN 123* 62*   62* 24*  CREATININE 18.20* 11.73*   11.70* 5.28*  CALCIUM 7.7* 8.2*   8.2* 8.4*  PHOS 11.2* 9.3* 3.7   Liver Function Tests: Recent Labs  Lab 06/04/21 2112 06/08/21 0938 06/09/21 0128  AST 31 35  --   ALT 19 35  --   ALKPHOS 68 61  --   BILITOT 0.8 0.6  --   PROT 6.4* 5.2*  --   ALBUMIN 3.4* 2.5*   2.5* 2.8*   Recent Labs  Lab 06/04/21 2112  LIPASE 47   CBC: Recent Labs  Lab 06/04/21 2112 06/06/21 1347 06/07/21 0127 06/08/21 0938  WBC 4.6 3.7* 4.6 4.0  NEUTROABS 3.4  --   --   --   HGB 7.7* 8.1* 7.8* 8.5*  HCT 24.6* 26.4* 25.3* 28.7*  MCV 93.9 94.0 92.3 94.4  PLT 255 262 243 237   Blood Culture    Component Value Date/Time    SDES BLOOD RIGHT HAND 06/07/2021 1531   SPECREQUEST  06/07/2021 1531    BOTTLES DRAWN AEROBIC ONLY Blood Culture results may not be optimal due to an inadequate volume of blood received in culture bottles   CULT  06/07/2021 1531    NO GROWTH 2 DAYS Performed at [facility], [place] [address], [place], [place] [address]    REPTSTATUS PENDING 06/07/2021 1531    Cardiac Enzymes: No results for input(s): CKTOTAL, CKMB, CKMBINDEX, TROPONINI in the last 168 hours. CBG: Recent Labs  Lab 06/07/21 2152  GLUCAP 115*   Iron Studies: No results for input(s): IRON, TIBC, TRANSFERRIN, FERRITIN in the last 72 hours. @lablastinr3 @ Studies/Results: CT HEAD WO CONTRAST (5MM)  Result Date: 06/07/2021 CLINICAL DATA:  71 year old female positive COVID-19. Altered mental status. EXAM: CT HEAD WITHOUT CONTRAST TECHNIQUE: Contiguous axial images were obtained from the base of the skull through the vertex without intravenous contrast. COMPARISON:  Brain MRI 05/21/2019. Head CT 05/19/2019. FINDINGS: Brain: No midline shift, ventriculomegaly, mass effect, evidence of mass lesion, intracranial hemorrhage or evidence of  cortically based acute infarction. Patchy and confluent bilateral white matter hypodensity appears not significantly changed since 2020. Chronic heterogeneity in the bilateral deep gray matter nuclei most pronounced in the left caudate. No cortical encephalomalacia identified. Vascular: Calcified atherosclerosis at the skull base. No suspicious intracranial vascular hyperdensity. Skull: Negative. Sinuses/Orbits: Visualized paranasal sinuses and mastoids are stable and well aerated. Other: Visualized orbits and scalp soft tissues are within normal limits. IMPRESSION: 1. No acute intracranial abnormality. 2. Stable non contrast CT appearance of chronic small vessel disease since 2020. Electronically Signed   By: Genevie Ann M.D.   On: 06/07/2021 12:13   DG CHEST PORT 1 VIEW  Result Date:  06/07/2021 CLINICAL DATA:  Fever.  Shortness of breath.  Follow-up exam. EXAM: PORTABLE CHEST 1 VIEW COMPARISON:  06/04/2021 and older exams.  CT, 06/05/2021. FINDINGS: Stable cardiomegaly. Right internal jugular dual lumen tunneled central venous catheter, also stable, tip in the right atrium. Lung opacities have improved. Minimal residual opacity noted in the medial lung bases. No new lung abnormalities. No pneumothorax. IMPRESSION: 1. Significant interval improvement in lung opacities consistent with improved pulmonary edema (favored) or multifocal pneumonia. Electronically Signed   By: Lajean Manes M.D.   On: 06/07/2021 16:16   Medications:  remdesivir 100 mg in NS 100 mL 100 mg (06/09/21 1010)    amLODipine  10 mg Oral Daily   Chlorhexidine Gluconate Cloth  6 each Topical Q0600   Chlorhexidine Gluconate Cloth  6 each Topical Q0600   cloNIDine  0.1 mg Transdermal Q Sat   [START ON 06/14/2021] darbepoetin (ARANESP) injection - NON-DIALYSIS  150 mcg Subcutaneous Q Sat-1800   hydrALAZINE  50 mg Oral TID   losartan  100 mg Oral Daily   pregabalin  100 mg Oral QHS   rosuvastatin  10 mg Oral Daily   sucroferric oxyhydroxide  1,000 mg Oral TID WC   torsemide  40 mg Oral Daily   Warfarin - Pharmacist Dosing Inpatient   Does not apply [id number]    HD orders: [facility] T,Th,S  3.45hrs  180NRe 400/Autoflow kg 44.5 kg 1.5 2.0K 2.0 Ca UFP 2 TDC -No heparin -Mircera 60 mcg IV q 2 weeks (last dose 05/22/2021) -Venofer 100 mg IV X 10 doses (3/10 doses given)    Assessment/Plan:    # ESRD - She has HD per TTS schedule. Next HD 06/10/2021   # Covid PNA  - Therapies per primary team  - she is on remdesivir - optimize volume status with HD    # Acute hypoxic resp failure  - 2/2 covid as well as overload  - HD as above and covid management per primary    # HTN/Volume  - optimize volume status with HD - daily weights ordered -Extra treatment 06/08/2021. Net UF 2.5 liters. UF as tolerated in HD. Lower  EDW    # anemia of CKD  -HGB 8.5 - transfusion per primary team parameters  - increased ESA.  aranesp 150 mcg weekly started 1/7.  Weekly on sat while here. Hold Fe load with COVID.    # Metabolic bone disease  - continue hectorol and resumed home velphoro. Marked hyperphosphatemia slightly improved - HD as above - renal panel daily while here  # Nutrition: Low Albumin. Renal diet. Add protein supps.    Disposition - would continue inpatient monitoring  Marcela Alatorre H. Achaia Garlock NP-C 06/09/2021, 10:21 AM  [employer] [phone]

## 2021-06-09 NOTE — Progress Notes (Signed)
Cab voucher also taken with patient at discharge

## 2021-06-09 NOTE — Discharge Summary (Signed)
Physician Discharge Summary  Monique Henderson JME:[id number] DOB: Dec 09, 1950 DOA: 06/04/2021  PCP: Monique Neither, PA-C  Admit date: 06/04/2021 Discharge date: 06/09/2021  Admitted From: home Disposition:  home  Recommendations for Outpatient Follow-up:  Follow up with PCP in 1-2 weeks  Home Health: PT Equipment/Devices: walker  Discharge Condition: stable CODE STATUS: Full code Diet recommendation: renal   HPI: Per admitting MD, Monique Henderson is a 71 y.o. female with history of ESRD on hemodialysis on Tuesday Thursday Saturday presents to the ER with complaints of shortness of breath and chest pain ongoing for the last 24 hours.  Patient had missed her dialysis yesterday since patient was not feeling well.  Last dialysis was on Saturday about 4 days ago.  Patient also has been having some pleuritic type chest pain on deep inspiration.  Has not taken her medications for last few days.  Hospital Course / Discharge diagnoses: Principal Problem Acute hypoxic respiratory failure -patient was admitted to the hospital with acute hypoxic respiratory failure.  This is believed to be possibly multifactorial due to missed dialysis and pulmonary edema as well as a component of COVID-pneumonia.  She was treated for Covid with Remdesivir as well as underwent dialysis for fluid overload and hypoxia resolved and she is back to room air.  She is able to ambulate with physical therapy, has returned to baseline and will be discharged home in stable condition   Active Problems Lethargy, acute metabolic encephalopathy-I wonder whether uremia played a role, had a CT scan of the brain without acute findings.  Resolved, back to baseline  COVID-19 pneumonia -chest x-ray done again 1/7 shows significant improvement after HD consistent with fluid overload playing a major role into her hypoxia, however COVID-19 pneumonia could not be fully ruled out.  Completed 5 days of Remdesivir as she is high risk   Essential hypertension-her home regimen was continued while hospitalized, however she was frequently bradycardic into the 40s.  Metoprolol discontinued and clonidine dose reduced.  Bradycardia resolved, heart rate is in the 60s and she is normotensive on discharge.  Hyperlipidemia-continue statin History of A. 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  Currently fluid overloaded due to missed dialysis, euvolemic after receiving dialysis  Sepsis ruled out   Discharge Instructions   Allergies as of 06/09/2021       Reactions   Shrimp [shellfish Allergy] Shortness Of Breath   Bactroban [mupirocin] Other (See Comments)   "Sores in nose"   Vicodin [hydrocodone-acetaminophen] Itching, Nausea And Vomiting   This is patient's home medication   Eliquis [apixaban] Itching   Spoke with patient, no rash   Lisinopril Cough   Tylenol [acetaminophen] Itching        Medication List     STOP taking these medications    cloNIDine 0.3 mg/24hr patch Commonly known as: CATAPRES - Dosed in mg/24 hr Replaced by: cloNIDine 0.1 mg/24hr patch   metoprolol tartrate 25 MG tablet Commonly known as: LOPRESSOR   oxyCODONE-acetaminophen 10-325 MG tablet Commonly known as: PERCOCET   warfarin 6 MG tablet Commonly known as: COUMADIN       TAKE these medications    acetaminophen 325 MG tablet Commonly known as: TYLENOL Take 650 mg by mouth every 6 (six) hours as needed for mild  pain.   albuterol 108 (90 Base) MCG/ACT inhaler Commonly known as: VENTOLIN HFA Inhale 2 puffs into the lungs every 6 (six) hours as needed for wheezing or shortness of breath.   amLODipine 10 MG tablet Commonly known as: NORVASC Take 1 tablet (10 mg  total) by mouth daily.   cloNIDine 0.1 mg/24hr patch Commonly known as: CATAPRES - Dosed in mg/24 hr Place 1 patch (0.1 mg total) onto the skin every Saturday. Start taking on: June 14, 2021 Replaces: cloNIDine 0.3 mg/24hr patch   cyclobenzaprine 5 MG tablet Commonly known as: FLEXERIL Take 5 mg by mouth 2 (two) times daily as needed for muscle spasms.   Eliquis 2.5 MG Tabs tablet Generic drug: apixaban Take 1 tablet (2.5 mg total) by mouth 2 (two) times daily.   hydrALAZINE 50 MG tablet Commonly known as: APRESOLINE Take 1 tablet (50 mg total) by mouth 3 (three) times daily. What changed: when to take this   loperamide 2 MG capsule Commonly known as: IMODIUM Take 2 mg by mouth as needed for diarrhea or loose stools.   losartan 100 MG tablet Commonly known as: COZAAR Take 1 tablet (100 mg total) by mouth daily.   mirtazapine 7.5 MG tablet Commonly known as: REMERON Take 1 tablet (7.5 mg total) by mouth at bedtime. What changed:  when to take this reasons to take this   Narcan 4 MG/0.1ML Liqd nasal spray kit Generic drug: naloxone Place 1 spray into the nose as needed for opioid reversal. If no response within 3 minutes then repeat once in the other nostril.  Call 911.   ondansetron 4 MG tablet Commonly known as: ZOFRAN Take 1 tablet (4 mg total) by mouth every 8 (eight) hours as needed for nausea or vomiting.   pregabalin 50 MG capsule Commonly known as: LYRICA Take 100 mg by mouth at bedtime.   rosuvastatin 10 MG tablet Commonly known as: CRESTOR Take 1 tablet (10 mg total) by mouth daily.   torsemide 20 MG tablet Commonly known as: DEMADEX Take 40 mg by mouth daily.   Velphoro 500 MG chewable tablet Generic drug: sucroferric oxyhydroxide Chew 1,000 mg by mouth 3 (three) times daily.   VITAMIN D PO Take 1 tablet by mouth daily.        Follow-up Information     Monique Neither, PA-C Follow up.   Specialty: Physician Assistant Contact  information: [place] [phone]                 Consultations: Nephrology  Procedures/Studies:  CT HEAD WO CONTRAST (5MM)  Result Date: 06/07/2021 CLINICAL DATA:  71 year old female positive COVID-19. Altered mental status. EXAM: CT HEAD WITHOUT CONTRAST TECHNIQUE: Contiguous axial images were obtained from the base of the skull through the vertex without intravenous contrast. COMPARISON:  Brain MRI 05/21/2019. Head CT 05/19/2019. FINDINGS: Brain: No midline shift, ventriculomegaly, mass effect, evidence of mass lesion, intracranial hemorrhage or evidence of cortically based acute infarction. Patchy and confluent bilateral white matter hypodensity appears not significantly changed since 2020. Chronic heterogeneity in the bilateral deep gray matter nuclei most pronounced in the left caudate. No cortical encephalomalacia identified. Vascular: Calcified atherosclerosis at the skull base. No suspicious intracranial vascular hyperdensity. Skull: Negative. Sinuses/Orbits: Visualized paranasal sinuses and mastoids are stable and well aerated. Other: Visualized orbits and scalp soft tissues are within normal limits. IMPRESSION: 1. No acute intracranial abnormality. 2. Stable non contrast CT appearance of chronic small vessel disease since  2020. Electronically Signed   By: Genevie Ann M.D.   On: 06/07/2021 12:13   DG CHEST PORT 1 VIEW  Result Date: 06/07/2021 CLINICAL DATA:  Fever.  Shortness of breath.  Follow-up exam. EXAM: PORTABLE CHEST 1 VIEW COMPARISON:  06/04/2021 and older exams.  CT, 06/05/2021. FINDINGS: Stable cardiomegaly. Right internal jugular dual lumen tunneled central venous catheter, also stable, tip in the right atrium. Lung opacities have improved. Minimal residual opacity noted in the medial lung bases. No new lung abnormalities. No pneumothorax. IMPRESSION: 1. Significant interval improvement in lung opacities consistent with improved  pulmonary edema (favored) or multifocal pneumonia. Electronically Signed   By: Lajean Manes M.D.   On: 06/07/2021 16:16   DG Chest Portable 1 View  Result Date: 06/04/2021 CLINICAL DATA:  Chest pain EXAM: PORTABLE CHEST 1 VIEW COMPARISON:  02/11/2021 FINDINGS: Diffuse interstitial pulmonary infiltrate is present most in keeping with mild interstitial pulmonary edema, likely cardiogenic in nature. The lungs are symmetrically well expanded. No pneumothorax or pleural effusion. Cardiac size is mildly enlarged, unchanged. Right internal jugular hemodialysis catheter tips are seen within the right atrium. Surgical clips overlie the right axilla and right lung base. No acute bone abnormality. IMPRESSION: Stable cardiomegaly.  Mild cardiogenic failure. Electronically Signed   By: Fidela Salisbury M.D.   On: 06/04/2021 19:18   CT ANGIO CHEST AORTA W/CM & OR WO/CM  Result Date: 06/05/2021 CLINICAL DATA:  Acute aortic syndrome suspected. EXAM: CT ANGIOGRAPHY CHEST WITH CONTRAST TECHNIQUE: Multidetector CT imaging of the chest was performed using the standard protocol during bolus administration of intravenous contrast. Multiplanar CT image reconstructions and MIPs were obtained to evaluate the vascular anatomy. CONTRAST:  24m OMNIPAQUE IOHEXOL 350 MG/ML SOLN COMPARISON:  06/08/2019. FINDINGS: Cardiovascular: No aortic intramural hemotoma. Preferential opacification of the thoracic aorta. No evidence of thoracic aortic aneurysm or dissection. The heart is enlarged and there is no pericardial effusion. The distal tip of a right central venous catheter terminates in the right atrium. The pulmonary trunk is normal in caliber. Mediastinum/Nodes: No enlarged mediastinal, hilar, or axillary lymph nodes. Thyroid gland, trachea, and esophagus demonstrate no significant findings. Lungs/Pleura: Hazy ground-glass opacities are present in the lungs bilaterally with relative subpleural sparing. There is a moderate right pleural  effusion and a trace left pleural effusion. No pneumothorax. Upper Abdomen: There is reflux of contrast into the inferior vena cava and hepatic veins which may be associated with right heart failure. Musculoskeletal: Sternotomy wires are noted over the midline. Degenerative changes are noted in the thoracic spine. No acute osseous abnormality. Review of the MIP images confirms the above findings. IMPRESSION: 1. Aortic atherosclerosis with no evidence of aortic dissection, aneurysm, or intramural hematoma. 2. Hazy airspace disease in the lungs bilaterally, suggesting edema, less likely infiltrate. 3. Moderate right pleural effusion and trace left pleural effusion. 4. Cardiomegaly. Electronically Signed   By: LBrett FairyM.D.   On: 06/05/2021 03:04     Subjective: - no chest pain, shortness of breath, no abdominal pain, nausea or vomiting.   Discharge Exam: BP 133/69 (BP Location: Left Arm)    Pulse 61    Temp 98.6 F (37 C) (Oral)    Resp 19    Wt (P) 48.2 kg    SpO2 97%    BMI (P) 18.82 kg/m   General: Pt is alert, awake, not in acute distress Cardiovascular: RRR, S1/S2 +, no rubs, no gallops Respiratory: CTA bilaterally, no wheezing, no rhonchi Abdominal: Soft, NT, ND, bowel sounds +  Extremities: no edema, no cyanosis   The results of significant diagnostics from this hospitalization (including imaging, microbiology, ancillary and laboratory) are listed below for reference.     Microbiology: Recent Results (from the past 240 hour(s))  Resp Panel by RT-PCR (Flu A&B, Covid) Nasopharyngeal Swab     Status: Abnormal   Collection Time: 06/05/21  2:03 AM   Specimen: Nasopharyngeal Swab; Nasopharyngeal(NP) swabs in vial transport medium  Result Value Ref Range Status   SARS Coronavirus 2 by RT PCR POSITIVE (A) NEGATIVE Final    Comment: (NOTE) SARS-CoV-2 target nucleic acids are DETECTED.  The SARS-CoV-2 RNA is generally detectable in upper respiratory specimens during the acute phase of  infection. Positive results are indicative of the presence of the identified virus, but do not rule out bacterial infection or co-infection with other pathogens not detected by the test. Clinical correlation with patient history and other diagnostic information is necessary to determine patient infection status. The expected result is Negative.  Fact Sheet for Patients: EntrepreneurPulse.com.au  Fact Sheet for Healthcare Providers: IncredibleEmployment.be  This test is not yet approved or cleared by the [place] FDA and  has been authorized for detection and/or diagnosis of SARS-CoV-2 by FDA under an Emergency Use Authorization (EUA).  This EUA will remain in effect (meaning this test can be used) for the duration of  the COVID-19 declaration under Section 564(b)(1) of the A ct, 21 U.S.C. section 360bbb-3(b)(1), unless the authorization is terminated or revoked sooner.     Influenza A by PCR NEGATIVE NEGATIVE Final   Influenza B by PCR NEGATIVE NEGATIVE Final    Comment: (NOTE) The Xpert Xpress SARS-CoV-2/FLU/RSV plus assay is intended as an aid in the diagnosis of influenza from Nasopharyngeal swab specimens and should not be used as a sole basis for treatment. Nasal washings and aspirates are unacceptable for Xpert Xpress SARS-CoV-2/FLU/RSV testing.  Fact Sheet for Patients: EntrepreneurPulse.com.au  Fact Sheet for Healthcare Providers: IncredibleEmployment.be  This test is not yet approved or cleared by the [place] FDA and has been authorized for detection and/or diagnosis of SARS-CoV-2 by FDA under an Emergency Use Authorization (EUA). This EUA will remain in effect (meaning this test can be used) for the duration of the COVID-19 declaration under Section 564(b)(1) of the Act, 21 U.S.C. section 360bbb-3(b)(1), unless the authorization is terminated or revoked.  Performed at [facility], [place] [address] [place], [place] [address]   Culture, blood (Routine X 2) w Reflex to ID Panel     Status: None (Preliminary result)   Collection Time: 06/07/21  3:20 PM   Specimen: BLOOD LEFT ARM  Result Value Ref Range Status   Specimen Description BLOOD LEFT ARM  Final   Special Requests   Final    BOTTLES DRAWN AEROBIC ONLY Blood Culture results may not be optimal due to an inadequate volume of blood received in culture bottles   Culture   Final    NO GROWTH 2 DAYS Performed at [facility], [place] [address] [place], [place] [address]    Report Status PENDING  Incomplete  Culture, blood (Routine X 2) w Reflex to ID Panel     Status: None (Preliminary result)   Collection Time: 06/07/21  3:31 PM   Specimen: BLOOD RIGHT HAND  Result Value Ref Range Status   Specimen Description BLOOD RIGHT HAND  Final   Special Requests   Final    BOTTLES DRAWN AEROBIC ONLY Blood Culture results may not  be optimal due to an inadequate volume of blood received in culture bottles   Culture   Final    NO GROWTH 2 DAYS Performed at [facility], [place] [address] [place], [place] [address]    Report Status PENDING  Incomplete     Labs: Basic Metabolic Panel: Recent Labs  Lab 06/05/21 0626 06/06/21 1347 06/07/21 0127 06/08/21 0938 06/09/21 0128  NA 137 135 136 135   136 135  K 5.4* 5.1 5.6* 4.5   4.6 3.2*  CL 99 96* 97* 98   98 97*  CO2 23 21* 19* _0 GLUCOSE 105* 126* 101* 89   87 122*  BUN 77* 110* 123* 62*   62* 24*  CREATININE 15.26* 17.55* 18.20* 11.73*   11.70* 5.28*  CALCIUM 8.3* 8.0* 7.7* 8.2*   8.2* 8.4*  MG  --   --  2.6* 2.2  --   PHOS  --   --  11.2* 9.3* 3.7   Liver Function Tests: Recent Labs  Lab 06/04/21 2112 06/08/21 0938 06/09/21 0128  AST 31 35  --   ALT 19 35  --   ALKPHOS 68 61  --   BILITOT 0.8 0.6  --   PROT 6.4* 5.2*  --   ALBUMIN 3.4* 2.5*   2.5* 2.8*   CBC: Recent Labs  Lab 06/04/21 2112 06/06/21 1347 06/07/21 0127  06/08/21 0938  WBC 4.6 3.7* 4.6 4.0  NEUTROABS 3.4  --   --   --   HGB 7.7* 8.1* 7.8* 8.5*  HCT 24.6* 26.4* 25.3* 28.7*  MCV 93.9 94.0 92.3 94.4  PLT 255 262 243 237   CBG: Recent Labs  Lab 06/07/21 2152  GLUCAP 115*   Hgb A1c No results for input(s): HGBA1C in the last 72 hours. Lipid Profile No results for input(s): CHOL, HDL, LDLCALC, TRIG, CHOLHDL, LDLDIRECT in the last 72 hours. Thyroid function studies No results for input(s): TSH, T4TOTAL, T3FREE, THYROIDAB in the last 72 hours.  Invalid input(s): FREET3 Urinalysis    Component Value Date/Time   COLORURINE STRAW (A) 09/21/2020 2018   APPEARANCEUR HAZY (A) 09/21/2020 2018   LABSPEC 1.010 09/21/2020 2018   PHURINE 7.0 09/21/2020 2018   GLUCOSEU NEGATIVE 09/21/2020 2018   GLUCOSEU NEG mg/dL 10/30/2009 1944   HGBUR NEGATIVE 09/21/2020 2018   BILIRUBINUR NEGATIVE 09/21/2020 2018   BILIRUBINUR negative 05/04/2018 0910   KETONESUR NEGATIVE 09/21/2020 2018   PROTEINUR 100 (A) 09/21/2020 2018   UROBILINOGEN 0.2 05/04/2018 0910   UROBILINOGEN 1 02/14/2014 0946   NITRITE NEGATIVE 09/21/2020 2018   LEUKOCYTESUR LARGE (A) 09/21/2020 2018    FURTHER DISCHARGE INSTRUCTIONS:   Get Medicines reviewed and adjusted: Please take all your medications with you for your next visit with your Primary MD   Laboratory/radiological data: Please request your Primary MD to go over all hospital tests and procedure/radiological results at the follow up, please ask your Primary MD to get all Hospital records sent to his/her office.   In some cases, they will be blood work, cultures and biopsy results pending at the time of your discharge. Please request that your primary care M.D. goes through all the records of your hospital data and follows up on these results.   Also Note the following: If you experience worsening of your admission symptoms, develop shortness of breath, life threatening emergency, suicidal or homicidal thoughts you  must seek medical attention immediately by calling 911 or calling your MD  immediately  if symptoms less severe.   You must read complete instructions/literature along with all the possible adverse reactions/side effects for all the Medicines you take and that have been prescribed to you. Take any new Medicines after you have completely understood and accpet all the possible adverse reactions/side effects.    Do not drive when taking Pain medications or sleeping medications (Benzodaizepines)   Do not take more than prescribed Pain, Sleep and Anxiety Medications. It is not advisable to combine anxiety,sleep and pain medications without talking with your primary care practitioner   Special Instructions: If you have smoked or chewed Tobacco  in the last 2 yrs please stop smoking, stop any regular Alcohol  and or any Recreational drug use.   Wear Seat belts while driving.   Please note: You were cared for by a hospitalist during your hospital stay. Once you are discharged, your primary care physician will handle any further medical issues. Please note that NO REFILLS for any discharge medications will be authorized once you are discharged, as it is imperative that you return to your primary care physician (or establish a relationship with a primary care physician if you do not have one) for your post hospital discharge needs so that they can reassess your need for medications and monitor your lab values.  Time coordinating discharge: 40 minutes  SIGNED:  Marzetta Board, MD, PhD 06/09/2021, 1:44 PM

## 2021-06-09 NOTE — Progress Notes (Signed)
Patient discharged to home via wheelchair to taxi. Sister called and made aware of time patient left. Patient discharged with all belongings (discharge paperwork, discharge meds, phone).

## 2021-06-09 NOTE — Evaluation (Signed)
Physical Therapy Evaluation Patient Details Name: Monique Henderson MRN: [id number] DOB: [birth year] Today's Date: 06/09/2021  History of Present Illness  71 year old female admitted 1/4 with shortness of breath.  Pt missed dialysis the day prior to admission, patient states due to not feeling well.  PMH: ESRD on HD, TTS  Clinical Impression  Pt admitted with above diagnosis. Pt was able to ambulate with RW with better stability than without. Pt intiailly hesitant to use RW but kept holding onto furniture and this PT told her she needs to use RW at home for safety.  Feel she would benefit from 24 hour care initially and pt states that her sister can stay.  Will need CM to confirm this.  Pt currently with functional limitations due to the deficits listed below (see PT Problem List). Pt will benefit from skilled PT to increase their independence and safety with mobility to allow discharge to the venue listed below.          Recommendations for follow up therapy are one component of a multi-disciplinary discharge planning process, led by the attending physician.  Recommendations may be updated based on patient status, additional functional criteria and insurance authorization.  Follow Up Recommendations Home health PT    Assistance Recommended at Discharge Frequent or constant Supervision/Assistance  Patient can return home with the following  A little help with walking and/or transfers;A little help with bathing/dressing/bathroom;Help with stairs or ramp for entrance    Equipment Recommendations None recommended by PT  Recommendations for Other Services       Functional Status Assessment Patient has had a recent decline in their functional status and demonstrates the ability to make significant improvements in function in a reasonable and predictable amount of time.     Precautions / Restrictions Precautions Precautions: Fall Restrictions Weight Bearing Restrictions: No      Mobility   Bed Mobility Overal bed mobility: Needs Assistance Bed Mobility: Supine to Sit     Supine to sit: Min guard     General bed mobility comments: a little assist with pt reaching for therapist    Transfers Overall transfer level: Needs assistance Equipment used: Rolling walker (2 wheels) Transfers: Sit to/from Stand Sit to Stand: Min assist           General transfer comment: Steadying assist needed.    Ambulation/Gait Ambulation/Gait assistance: Min assist Gait Distance (Feet): 50 Feet Assistive device: Rolling walker (2 wheels);None Gait Pattern/deviations: Step-through pattern;Decreased stride length;Trunk flexed;Drifts right/left   Gait velocity interpretation: <1.31 ft/sec, indicative of household ambulator   General Gait Details: Pt intiially refusing to use RW however was unsteady and reaching for furniture. Pt appeared unaware of deficits even though PT was educating her regarding them.  Pt did finally use the RW and did much better with the RW.  Pt much more steady but still never agreed to use this at home. She does have a RW per pt report.  Stairs            Wheelchair Mobility    Modified Rankin (Stroke Patients Only)       Balance Overall balance assessment: Needs assistance Sitting-balance support: No upper extremity supported;Feet supported Sitting balance-Leahy Scale: Fair     Standing balance support: No upper extremity supported;During functional activity;Bilateral upper extremity supported Standing balance-Leahy Scale: Poor Standing balance comment: relies on RW for support  Pertinent Vitals/Pain Pain Assessment: No/denies pain    Home Living Family/patient expects to be discharged to:: Private residence Living Arrangements: Alone Available Help at Discharge: Family;Available PRN/intermittently (sister there 1/2 day every day) Type of Home:  (town house) Home Access: Level entry     Alternate  Level Stairs-Number of Steps: stair lift up to bedroom Home Layout: Two level Home Equipment: Conservation officer, nature (2 wheels);Cane - single point;BSC/3in1      Prior Function Prior Level of Function : Independent/Modified Independent                     Hand Dominance   Dominant Hand: Right    Extremity/Trunk Assessment   Upper Extremity Assessment Upper Extremity Assessment: Defer to OT evaluation    Lower Extremity Assessment Lower Extremity Assessment: Generalized weakness    Cervical / Trunk Assessment Cervical / Trunk Assessment: Kyphotic  Communication   Communication: No difficulties  Cognition Arousal/Alertness: Awake/alert Behavior During Therapy: WFL for tasks assessed/performed Overall Cognitive Status: Impaired/Different from baseline Area of Impairment: Safety/judgement                         Safety/Judgement: Decreased awareness of safety              General Comments      Exercises     Assessment/Plan    PT Assessment Patient needs continued PT services  PT Problem List Decreased balance;Decreased mobility;Decreased activity tolerance;Decreased knowledge of use of DME;Decreased safety awareness;Decreased knowledge of precautions       PT Treatment Interventions DME instruction;Gait training;Functional mobility training;Therapeutic activities;Therapeutic exercise;Balance training;Patient/family education    PT Goals (Current goals can be found in the Care Plan section)  Acute Rehab PT Goals Patient Stated Goal: to go home PT Goal Formulation: With patient Time For Goal Achievement: 06/23/21 Potential to Achieve Goals: Good    Frequency Min 3X/week     Co-evaluation               AM-PAC PT "6 Clicks" Mobility  Outcome Measure Help needed turning from your back to your side while in a flat bed without using bedrails?: None Help needed moving from lying on your back to sitting on the side of a flat bed without using  bedrails?: None Help needed moving to and from a bed to a chair (including a wheelchair)?: A Little Help needed standing up from a chair using your arms (e.g., wheelchair or bedside chair)?: A Little Help needed to walk in hospital room?: A Little Help needed climbing 3-5 steps with a railing? : Total 6 Click Score: 18    End of Session Equipment Utilized During Treatment: Gait belt Activity Tolerance: Patient limited by fatigue Patient left: in bed;with call bell/phone within reach Nurse Communication: Mobility status PT Visit Diagnosis: Unsteadiness on feet (R26.81);Muscle weakness (generalized) (M62.81)    Time: 7867-6720 PT Time Calculation (min) (ACUTE ONLY): 22 min   Charges:   PT Evaluation $PT Eval Moderate Complexity: 1 Mod          Abir Eroh M,PT Acute Rehab Services [phone] (pager)   Alvira Philips 06/09/2021, 12:51 PM

## 2021-06-09 NOTE — Progress Notes (Signed)
[facility] for Coumadin --> apixaban Indication: atrial fibrillation and stroke  Vital Signs: Temp: 97.6 F (36.4 C) (01/09 0532) Temp Source: Oral (01/09 0532) BP: 156/70 (01/09 0532) Pulse Rate: 65 (01/09 0532)  Labs: Recent Labs    06/06/21 1347 06/07/21 0127 06/08/21 0938 06/09/21 0128  HGB 8.1* 7.8* 8.5*  --   HCT 26.4* 25.3* 28.7*  --   PLT 262 243 237  --   LABPROT  --  16.2* 17.7* 18.3*  INR  --  1.3* 1.5* 1.5*  CREATININE 17.55* 18.20* 11.73*   11.70* 5.28*    Assessment: 71yo female c/o chills, SOB, and cough, admitted for acute pulmonary edema d/t missing HD, also found to have Covid, to continue Coumadin for Afib.   Clarified PTA warfarin regimen with patient today. She is unable to tell strength of tablet but does acknowledge a teal-blue color which would fit the 6 mg tablets. This is the only strength filled by pharmacy. Patient is unable to confirm she is getting any monitoring of her INR outpatient. Appears she is non compliant given her INR is subtherapeutic when checked.  Dismissed from [facility] 02/21/21.  Called and spoke with Aurelio Jew who is new PCP. She is a new patient to the practice and was unable to discern who was monitoring INR at last visit. She is in agreement with me to switch to apixaban and requested a scheduled follow-up visit prior to discharge.  Dr Cruzita Lederer and Juanell Fairly both agree on switch to apixaban. CM to set up a follow-up visit with PCP.  Discussed reported allergy to apixaban with patient. She reports no rash but said she has some itching. She is willing to try apixaban again.  Plan:  Discontinue warfarin Apixaban 2.5 mg PO bid for weight and kidney function Educated patient  Thank you for involving pharmacy in this patient's care.  Renold Genta, PharmD, BCPS Clinical Pharmacist Clinical phone for 06/09/2021 until 3p is [phone] 06/09/2021 8:51 AM  **Pharmacist phone  directory can be found on [email or web address] listed under [facility]**

## 2021-06-09 NOTE — Progress Notes (Signed)
Discharge instructions given to patient and patients sister Retta Diones. Both verbalized understanding of all teaching. Discharge medication delivered to patients room for discharge. IV removed. Transportation being set up now for discharge.

## 2021-06-09 NOTE — Discharge Instructions (Signed)

## 2021-06-09 NOTE — Care Management (Signed)
Patient from home alone.    Nurse requesting taxi voucher for patient to get home. NCM spoke with patient, patient stated her sister has her house keys and will pick her up. Called patient's sister from bedside and placed on speaker phone. Sister stated she cannot provide transportation for her sister to get home. Sister will met patient at home to let her in.   Patient does not have cloths with her. Nurse can provide paper scrubs.   Patient and sister aware.   NCM confirmed address and provided nurse with cab voucher.   Home health PT will be provided by [facility], [place].    Patient will call her sister when she leaves in cab

## 2021-06-09 NOTE — Progress Notes (Signed)
[employer] called for patient. Patient currently in room waiting for pick up

## 2021-06-09 NOTE — Progress Notes (Signed)
Pt for d/c today. Contacted [facility] and spoke to [place]. Clinic advised pt for d/c today and pt will resume care tomorrow. Clinic aware of pt's covid diagnosis. Spoke to pt via phone. Pt aware she needs to resume at clinic tomorrow. Pt advised she needs to call clinic when she arrives in the parking lot and staff will come get pt when time to enter the clinic. Pt advised she cannot sit in the lobby due to covid diagnosis. Pt voices understanding.   Melven Sartorius Renal Navigator [phone]

## 2021-06-10 ENCOUNTER — Telehealth: Payer: Self-pay | Admitting: Physician Assistant

## 2021-06-10 LAB — HEPATITIS B SURFACE ANTIBODY, QUANTITATIVE: Hep B S AB Quant (Post): 60.6 m[IU]/mL (ref >9.9)

## 2021-06-10 NOTE — Telephone Encounter (Signed)
Transition of care contact from inpatient facility  Date of Discharge: 06/09/21 Date of Contact: 06/10/21 Method of contact: Phone  Attempted to contact patient to discuss transition of care from inpatient admission. Patient did not come to dialysis today.  Patient did not answer the phone. Unable to leave voicemail as mailbox was full.   Anice Paganini, PA-C 06/10/2021, 1:16 PM  [employer]

## 2021-06-10 NOTE — Progress Notes (Deleted)
Called sister Retta Diones to pick up patients med. Sister said she will pick it up today.

## 2021-06-10 NOTE — Progress Notes (Signed)
Nursing Note: Elon Jester Joaquim Lai) charge nurse  1/10 spoke to the patients sister Retta Diones who plans to come by and pick up the patients medication that was left in the room.

## 2021-06-12 LAB — CULTURE, BLOOD (ROUTINE X 2)
Culture: NO GROWTH
Culture: NO GROWTH

## 2021-06-19 ENCOUNTER — Observation Stay (HOSPITAL_BASED_OUTPATIENT_CLINIC_OR_DEPARTMENT_OTHER): Payer: Medicare Other

## 2021-06-19 ENCOUNTER — Emergency Department (HOSPITAL_COMMUNITY): Payer: Medicare Other

## 2021-06-19 ENCOUNTER — Observation Stay (HOSPITAL_COMMUNITY)
Admission: EM | Admit: 2021-06-19 | Discharge: 2021-06-21 | Disposition: A | Discharge status: Home / Self Care | Payer: Medicare Other | Attending: Internal Medicine | Admitting: Internal Medicine

## 2021-06-19 ENCOUNTER — Observation Stay (HOSPITAL_COMMUNITY): Payer: Medicare Other

## 2021-06-19 ENCOUNTER — Encounter (HOSPITAL_COMMUNITY): Payer: Self-pay | Admitting: *Deleted

## 2021-06-19 ENCOUNTER — Other Ambulatory Visit: Payer: Self-pay

## 2021-06-19 DIAGNOSIS — Z853 Personal history of malignant neoplasm of breast: Secondary | ICD-10-CM | POA: Insufficient documentation

## 2021-06-19 DIAGNOSIS — N186 End stage renal disease: Secondary | ICD-10-CM

## 2021-06-19 DIAGNOSIS — Z7901 Long term (current) use of anticoagulants: Secondary | ICD-10-CM | POA: Diagnosis not present

## 2021-06-19 DIAGNOSIS — I120 Hypertensive chronic kidney disease with stage 5 chronic kidney disease or end stage renal disease: Secondary | ICD-10-CM | POA: Diagnosis not present

## 2021-06-19 DIAGNOSIS — Z87891 Personal history of nicotine dependence: Secondary | ICD-10-CM | POA: Diagnosis not present

## 2021-06-19 DIAGNOSIS — R0609 Other forms of dyspnea: Secondary | ICD-10-CM | POA: Diagnosis not present

## 2021-06-19 DIAGNOSIS — I4891 Unspecified atrial fibrillation: Secondary | ICD-10-CM | POA: Diagnosis not present

## 2021-06-19 DIAGNOSIS — R071 Chest pain on breathing: Secondary | ICD-10-CM | POA: Diagnosis not present

## 2021-06-19 DIAGNOSIS — R0789 Other chest pain: Principal | ICD-10-CM | POA: Insufficient documentation

## 2021-06-19 DIAGNOSIS — R072 Precordial pain: Secondary | ICD-10-CM

## 2021-06-19 DIAGNOSIS — R079 Chest pain, unspecified: Secondary | ICD-10-CM | POA: Diagnosis present

## 2021-06-19 DIAGNOSIS — Z79899 Other long term (current) drug therapy: Secondary | ICD-10-CM | POA: Diagnosis not present

## 2021-06-19 DIAGNOSIS — D631 Anemia in chronic kidney disease: Secondary | ICD-10-CM | POA: Insufficient documentation

## 2021-06-19 DIAGNOSIS — J449 Chronic obstructive pulmonary disease, unspecified: Secondary | ICD-10-CM | POA: Diagnosis present

## 2021-06-19 DIAGNOSIS — R059 Cough, unspecified: Secondary | ICD-10-CM | POA: Diagnosis present

## 2021-06-19 DIAGNOSIS — Z91199 Patient's noncompliance with other medical treatment and regimen due to unspecified reason: Secondary | ICD-10-CM

## 2021-06-19 DIAGNOSIS — Z992 Dependence on renal dialysis: Secondary | ICD-10-CM | POA: Diagnosis not present

## 2021-06-19 DIAGNOSIS — I10 Essential (primary) hypertension: Secondary | ICD-10-CM | POA: Diagnosis present

## 2021-06-19 DIAGNOSIS — R0602 Shortness of breath: Secondary | ICD-10-CM | POA: Diagnosis not present

## 2021-06-19 DIAGNOSIS — Z8673 Personal history of transient ischemic attack (TIA), and cerebral infarction without residual deficits: Secondary | ICD-10-CM

## 2021-06-19 LAB — ECHOCARDIOGRAM COMPLETE
Area-P 1/2: 5.38 cm2
Calc EF: 41.6 %
Height: 63 in
MV M vel: 5.79 m/s
MV Peak grad: 134.1 mmHg
Radius: 0.2 cm
S' Lateral: 3.4 cm
Single Plane A2C EF: 41.4 %
Single Plane A4C EF: 41.9 %
Weight: 1700.19 oz

## 2021-06-19 LAB — CBC WITH DIFFERENTIAL/PLATELET
Abs Immature Granulocytes: 0.03 10*3/uL (ref 0.00–0.07)
Basophils Absolute: 0 10*3/uL (ref 0.0–0.1)
Basophils Relative: 0 %
Eosinophils Absolute: 0.2 10*3/uL (ref 0.0–0.5)
Eosinophils Relative: 3 %
HCT: 28.2 % — ABNORMAL LOW (ref 36.0–46.0)
Hemoglobin: 8.7 g/dL — ABNORMAL LOW (ref 12.0–15.0)
Immature Granulocytes: 1 %
Lymphocytes Relative: 13 %
Lymphs Abs: 0.8 10*3/uL (ref 0.7–4.0)
MCH: 29.3 pg (ref 26.0–34.0)
MCHC: 30.9 g/dL (ref 30.0–36.0)
MCV: 94.9 fL (ref 80.0–100.0)
Monocytes Absolute: 0.6 10*3/uL (ref 0.1–1.0)
Monocytes Relative: 10 %
Neutro Abs: 4.7 10*3/uL (ref 1.7–7.7)
Neutrophils Relative %: 73 %
Platelets: 282 10*3/uL (ref 150–400)
RBC: 2.97 MIL/uL — ABNORMAL LOW (ref 3.87–5.11)
RDW: 20.7 % — ABNORMAL HIGH (ref 11.5–15.5)
WBC: 6.3 10*3/uL (ref 4.0–10.5)
nRBC: 0 % (ref 0.0–0.2)

## 2021-06-19 LAB — COMPREHENSIVE METABOLIC PANEL
ALT: 12 U/L (ref 0–44)
AST: 17 U/L (ref 15–41)
Albumin: 3.3 g/dL — ABNORMAL LOW (ref 3.5–5.0)
Alkaline Phosphatase: 89 U/L (ref 38–126)
Anion gap: 15 (ref 5–15)
BUN: 98 mg/dL — ABNORMAL HIGH (ref 8–23)
CO2: 20 mmol/L — ABNORMAL LOW (ref 22–32)
Calcium: 8.7 mg/dL — ABNORMAL LOW (ref 8.9–10.3)
Chloride: 105 mmol/L (ref 98–111)
Creatinine, Ser: 12.9 mg/dL — ABNORMAL HIGH (ref 0.44–1.00)
GFR, Estimated: 3 mL/min — ABNORMAL LOW (ref >60)
Glucose, Bld: 115 mg/dL — ABNORMAL HIGH (ref 70–99)
Potassium: 5.6 mmol/L — ABNORMAL HIGH (ref 3.5–5.1)
Sodium: 140 mmol/L (ref 135–145)
Total Bilirubin: 0.5 mg/dL (ref 0.3–1.2)
Total Protein: 6.9 g/dL (ref 6.5–8.1)

## 2021-06-19 LAB — LACTIC ACID, PLASMA: Lactic Acid, Venous: 0.9 mmol/L (ref 0.5–1.9)

## 2021-06-19 LAB — TROPONIN I (HIGH SENSITIVITY)
Troponin I (High Sensitivity): 354 ng/L (ref <18)
Troponin I (High Sensitivity): 310 ng/L (ref <18)

## 2021-06-19 MED ORDER — APIXABAN 2.5 MG PO TABS
2.5000 mg | ORAL_TABLET | Freq: Two times a day (BID) | ORAL | Status: DC
  Administered 2021-06-19 – 2021-06-21 (×5): 2.5 mg via ORAL
  Filled 2021-06-19 (×5): qty 1

## 2021-06-19 MED ORDER — HYDRALAZINE HCL 50 MG PO TABS
50.0000 mg | ORAL_TABLET | Freq: Three times a day (TID) | ORAL | Status: DC
  Administered 2021-06-19 – 2021-06-20 (×3): 50 mg via ORAL
  Filled 2021-06-19: qty 1
  Filled 2021-06-19: qty 2
  Filled 2021-06-19: qty 1

## 2021-06-19 MED ORDER — CLONIDINE HCL 0.1 MG/24HR TD PTWK: 0.1000 mg | MEDICATED_PATCH | TRANSDERMAL | Status: DC

## 2021-06-19 MED ORDER — CLONIDINE HCL 0.1 MG/24HR TD PTWK
0.1000 mg | MEDICATED_PATCH | TRANSDERMAL | Status: DC
  Administered 2021-06-21: 0.1 mg via TRANSDERMAL
  Filled 2021-06-19: qty 1

## 2021-06-19 MED ORDER — LOPERAMIDE HCL 2 MG PO CAPS: 2.0000 mg | ORAL_CAPSULE | ORAL | Status: DC | PRN

## 2021-06-19 MED ORDER — HYDRALAZINE HCL 25 MG PO TABS
50.0000 mg | ORAL_TABLET | Freq: Once | ORAL | Status: AC
Start: 2021-06-19 — End: 2021-06-19
  Administered 2021-06-19: 50 mg via ORAL
  Filled 2021-06-19: qty 2

## 2021-06-19 MED ORDER — IOHEXOL 350 MG/ML SOLN
80.0000 mL | Freq: Once | INTRAVENOUS | Status: AC | PRN
  Administered 2021-06-19: 80 mL via INTRAVENOUS

## 2021-06-19 MED ORDER — FENTANYL CITRATE PF 50 MCG/ML IJ SOSY
25.0000 ug | PREFILLED_SYRINGE | Freq: Once | INTRAMUSCULAR | Status: AC
  Administered 2021-06-19: 25 ug via INTRAVENOUS
  Filled 2021-06-19: qty 1

## 2021-06-19 MED ORDER — DOXERCALCIFEROL 4 MCG/2ML IV SOLN
3.0000 ug | INTRAVENOUS | Status: DC
  Administered 2021-06-21: 3 ug via INTRAVENOUS
  Filled 2021-06-19 (×2): qty 2

## 2021-06-19 MED ORDER — LOSARTAN POTASSIUM 50 MG PO TABS
100.0000 mg | ORAL_TABLET | Freq: Every day | ORAL | Status: DC
  Administered 2021-06-19 – 2021-06-21 (×3): 100 mg via ORAL
  Filled 2021-06-19 (×3): qty 2

## 2021-06-19 MED ORDER — CHLORHEXIDINE GLUCONATE CLOTH 2 % EX PADS: 6.0000 | MEDICATED_PAD | Freq: Every day | CUTANEOUS | Status: DC

## 2021-06-19 MED ORDER — HEPARIN SODIUM (PORCINE) 1000 UNIT/ML DIALYSIS
1000.0000 [IU] | INTRAMUSCULAR | Status: DC | PRN
  Filled 2021-06-19: qty 1

## 2021-06-19 MED ORDER — SODIUM CHLORIDE 0.9% FLUSH: 3.0000 mL | INTRAVENOUS | Status: DC | PRN

## 2021-06-19 MED ORDER — LIDOCAINE-PRILOCAINE 2.5-2.5 % EX CREA
1.0000 "application " | TOPICAL_CREAM | CUTANEOUS | Status: DC | PRN
  Filled 2021-06-19: qty 5

## 2021-06-19 MED ORDER — CLONIDINE HCL 0.1 MG PO TABS
0.1000 mg | ORAL_TABLET | Freq: Once | ORAL | Status: AC
  Administered 2021-06-19: 0.1 mg via ORAL
  Filled 2021-06-19: qty 1

## 2021-06-19 MED ORDER — CYCLOBENZAPRINE HCL 5 MG PO TABS: 5.0000 mg | ORAL_TABLET | Freq: Two times a day (BID) | ORAL | Status: DC | PRN

## 2021-06-19 MED ORDER — LABETALOL HCL 5 MG/ML IV SOLN
10.0000 mg | Freq: Once | INTRAVENOUS | Status: AC
  Administered 2021-06-19: 10 mg via INTRAVENOUS
  Filled 2021-06-19: qty 4

## 2021-06-19 MED ORDER — PENTAFLUOROPROP-TETRAFLUOROETH EX AERO
1.0000 "application " | INHALATION_SPRAY | CUTANEOUS | Status: DC | PRN
  Filled 2021-06-19: qty 30

## 2021-06-19 MED ORDER — HEPARIN SODIUM (PORCINE) 1000 UNIT/ML IJ SOLN
INTRAMUSCULAR | Status: AC
  Filled 2021-06-19: qty 4

## 2021-06-19 MED ORDER — SUCROFERRIC OXYHYDROXIDE 500 MG PO CHEW
1000.0000 mg | CHEWABLE_TABLET | Freq: Three times a day (TID) | ORAL | Status: DC
  Administered 2021-06-19 – 2021-06-21 (×5): 1000 mg via ORAL
  Filled 2021-06-19 (×12): qty 2

## 2021-06-19 MED ORDER — ALBUTEROL SULFATE (2.5 MG/3ML) 0.083% IN NEBU: 2.5000 mg | INHALATION_SOLUTION | Freq: Four times a day (QID) | RESPIRATORY_TRACT | Status: DC | PRN

## 2021-06-19 MED ORDER — SODIUM CHLORIDE 0.9 % IV SOLN: 100.0000 mL | INTRAVENOUS | Status: DC | PRN

## 2021-06-19 MED ORDER — PREGABALIN 100 MG PO CAPS
100.0000 mg | ORAL_CAPSULE | Freq: Every day | ORAL | Status: DC
  Administered 2021-06-19 – 2021-06-20 (×2): 100 mg via ORAL
  Filled 2021-06-19 (×2): qty 1

## 2021-06-19 MED ORDER — AMLODIPINE BESYLATE 10 MG PO TABS
10.0000 mg | ORAL_TABLET | Freq: Every day | ORAL | Status: DC
  Administered 2021-06-19 – 2021-06-20 (×2): 10 mg via ORAL
  Filled 2021-06-19: qty 1
  Filled 2021-06-19: qty 2

## 2021-06-19 MED ORDER — ACETAMINOPHEN 325 MG PO TABS
650.0000 mg | ORAL_TABLET | Freq: Once | ORAL | Status: AC
  Administered 2021-06-19: 650 mg via ORAL
  Filled 2021-06-19: qty 2

## 2021-06-19 MED ORDER — ONDANSETRON HCL 4 MG PO TABS
4.0000 mg | ORAL_TABLET | Freq: Three times a day (TID) | ORAL | Status: DC | PRN
  Administered 2021-06-19: 4 mg via ORAL
  Filled 2021-06-19: qty 1

## 2021-06-19 MED ORDER — ALTEPLASE 2 MG IJ SOLR: 2.0000 mg | Freq: Once | INTRAMUSCULAR | Status: DC | PRN

## 2021-06-19 MED ORDER — TORSEMIDE 20 MG PO TABS
40.0000 mg | ORAL_TABLET | Freq: Every day | ORAL | Status: DC
  Administered 2021-06-19 – 2021-06-21 (×3): 40 mg via ORAL
  Filled 2021-06-19 (×3): qty 2

## 2021-06-19 MED ORDER — MIRTAZAPINE 15 MG PO TABS
7.5000 mg | ORAL_TABLET | Freq: Every evening | ORAL | Status: DC | PRN
  Filled 2021-06-19: qty 1

## 2021-06-19 MED ORDER — LIDOCAINE HCL (PF) 1 % IJ SOLN: 5.0000 mL | INTRAMUSCULAR | Status: DC | PRN

## 2021-06-19 MED ORDER — LABETALOL HCL 5 MG/ML IV SOLN
20.0000 mg | Freq: Once | INTRAVENOUS | Status: AC
  Administered 2021-06-19: 20 mg via INTRAVENOUS
  Filled 2021-06-19: qty 4

## 2021-06-19 MED ORDER — SODIUM CHLORIDE 0.9 % IV SOLN
1.0000 g | Freq: Once | INTRAVENOUS | Status: AC
  Administered 2021-06-19: 1 g via INTRAVENOUS
  Filled 2021-06-19: qty 10

## 2021-06-19 MED ORDER — DOXYCYCLINE HYCLATE 100 MG PO TABS
100.0000 mg | ORAL_TABLET | Freq: Once | ORAL | Status: AC
  Administered 2021-06-19: 100 mg via ORAL
  Filled 2021-06-19: qty 1

## 2021-06-19 MED ORDER — ACETAMINOPHEN 500 MG PO TABS
1000.0000 mg | ORAL_TABLET | Freq: Once | ORAL | Status: AC
  Administered 2021-06-19: 1000 mg via ORAL
  Filled 2021-06-19: qty 2

## 2021-06-19 MED ORDER — ROSUVASTATIN CALCIUM 5 MG PO TABS
10.0000 mg | ORAL_TABLET | Freq: Every day | ORAL | Status: DC
  Administered 2021-06-19 – 2021-06-21 (×3): 10 mg via ORAL
  Filled 2021-06-19 (×3): qty 2

## 2021-06-19 MED ORDER — SODIUM CHLORIDE 0.9 % IV SOLN: 250.0000 mL | INTRAVENOUS | Status: DC | PRN

## 2021-06-19 MED ORDER — DARBEPOETIN ALFA 150 MCG/0.3ML IJ SOSY: 150.0000 ug | PREFILLED_SYRINGE | INTRAMUSCULAR | Status: DC

## 2021-06-19 MED ORDER — SODIUM CHLORIDE 0.9% FLUSH
3.0000 mL | Freq: Two times a day (BID) | INTRAVENOUS | Status: DC
  Administered 2021-06-19 – 2021-06-21 (×5): 3 mL via INTRAVENOUS

## 2021-06-19 NOTE — Consult Note (Signed)
[facility] Renal Consultation Note    Indication for Consultation:  Management of ESRD/hemodialysis; anemia, hypertension/volume and secondary hyperparathyroidism   HPI: Monique Henderson is a 71 y.o. female with ESRD on HD TTS, HTN, PAFib, Aortic dissection s/p repair. She is admitted observation status with  chest pain and cough for about a week. Recent [facility] admission 1/4-1/9 with Acute respiratory failure secondary to Covid-19 PNA. CXR showing possible pulmonary edema. CTA ordered to rule out PE.  Labs Na 140, K 5.6, CO2 20, BUN 98 Cr 12.9 Ca 8.7.   Usual dialysis TTS at [facility]. Last dialysis was Saturday. She missed Tuesday, but doesn't recall why. She does have history of being noncompliant with dialysis. She endorses continued SOB, cough. Hasn't been eating well. Denies f,c, n/v.   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 Autumn Messing; S/P re-excision of superior and inferior margins 10/27/2012.    ESRD on dialysis ([place]) 05/29/2019   Essential hypertension 09/16/2006   GERD 09/16/2006   Hepatitis C    treated and RNA confirmed not detectable 01/2017   Insomnia 03/14/2015   Malnutrition of moderate degree 05/19/2019   Non compliance with medical treatment 12/04/2017   Normocytic anemia    With thrombocytosis   Osteoarthritis     Right ureteral stone 2002   S/P lumbar spinal fusion 01/18/2014   S/P lumbar decompressive laminectomy, fusion, and plating for lumbar spinal stenosis on 05/27/2009 by Dr. Eustace Moore.  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 Eustace Moore.     Tobacco use disorder 04/19/2009   Uterine fibroid    Wears dentures    top   Past Surgical History:  Procedure Laterality Date   ANTERIOR LAT LUMBAR FUSION N/A 01/18/2014   Procedure: ANTERIOR LATERAL LUMBAR FUSION LUMBAR TWO-THREE;  Surgeon: Eustace Moore, MD;  Location: [place] [facility];  Service: Neurosurgery;  Laterality: N/A;   ANTERIOR LUMBAR FUSION  01/18/2014   AV FISTULA PLACEMENT Left 04/20/2019   Procedure: ARTERIOVENOUS (AV) FISTULA CREATION;  Surgeon: [name], MD;  Location: [place];  Service: Vascular;  Laterality: Left;   BACK SURGERY     BREAST LUMPECTOMY Left 01/2003   BREAST LUMPECTOMY Right 2014   BREAST LUMPECTOMY WITH NEEDLE LOCALIZATION AND AXILLARY SENTINEL LYMPH NODE BX Right 10/13/2012   Procedure: BREAST LUMPECTOMY WITH NEEDLE LOCALIZATION;  Surgeon: [name], MD;  Location: [place];  Service: General;  Laterality: Right;  Right breast wire localized lumpectomy   INSERTION OF DIALYSIS CATHETER Right 04/20/2019   Procedure: INSERTION OF DIALYSIS CATHETER, right internal jugular;  Surgeon: [name], MD;  Location: [place];  Service: Vascular;  Laterality: Right;  INSERTION OF DIALYSIS CATHETER Right 09/24/2020   Procedure: INSERTION OF TUNNELED DIALYSIS CATHETER;  Surgeon: Rosetta Posner, MD;  Location: [place];  Service: Vascular;  Laterality: Right;   IR FLUORO GUIDE CV LINE RIGHT  09/22/2020   IR THORACENTESIS ASP PLEURAL SPACE W/IMG GUIDE  05/19/2019   IR US GUIDE VASC ACCESS  LEFT  09/22/2020   IR US GUIDE VASC ACCESS RIGHT  09/22/2020   IR VENOCAVAGRAM SVC  09/22/2020   LAMINECTOMY  05/27/2009   Lumbar decompressive laminectomy, fusion and plating for lumbar spinal stensosis   LIGATION OF ARTERIOVENOUS  FISTULA Left 09/24/2020   Procedure: LIGATION OF LEFT ARM ARTERIOVENOUS  FISTULA;  Surgeon: Rosetta Posner, MD;  Location: [place];  Service: Vascular;  Laterality: Left;   LUMBAR LAMINECTOMY/DECOMPRESSION MICRODISCECTOMY Left 03/23/2013   Procedure: LUMBAR LAMINECTOMY/DECOMPRESSION MICRODISCECTOMY 1 LEVEL;  Surgeon: Eustace Moore, MD;  Location: [place] [facility]UR[facility] ORS;  Service: Neurosurgery;  Laterality: Left;  LUMBAR LAMINECTOMY/DECOMPRESSION MICRODISCECTOMY 1 LEVEL   MASTECTOMY, PARTIAL Left 02/26/2003   ; S/P re-excision of cranial and lateral margins 04/19/2003.    RE-EXCISION OF BREAST CANCER,SUPERIOR MARGINS Right 10/27/2012   Procedure: RE-EXCISION OF BREAST CANCER,SUPERIOR and inferior MARGINS;  Surgeon: [name], MD;  Location: [place];  Service: General;  Laterality: Right;   RE-EXCISION OF BREAST LUMPECTOMY Left 04/2003   TEE WITHOUT CARDIOVERSION N/A 04/04/2019   Procedure: Transesophageal Echocardiogram (Tee);  Surgeon: Wonda Olds, MD;  Location: [place];  Service: Open Heart Surgery;  Laterality: N/A;   THORACIC AORTIC ANEURYSM REPAIR N/A 04/04/2019   Procedure: THORACIC ASCENDING ANEURYSM REPAIR (AAA)  USING 28 MM X 30 CM HEMASHIELD PLATINUM VASCULAR GRAFT;  Surgeon: Wonda Olds, MD;  Location[facility];  Service: Open Heart Surgery;  Laterality: N/A;   Family History  Problem Relation Age of Onset   Colon cancer Mother 40   Hypertension Mother    Diabetes Sister 66   Hypertension Sister    Diabetes Brother    Hypertension Brother    Diabetes Brother    Hypertension Brother    Kidney disease Son        s/p renal transplant   Hypertension Son    Diabetes Son    Multiple sclerosis Son    Bone cancer Sister 75   Breast cancer Neg Hx    Cervical  cancer Neg Hx    Social History:  reports that she quit smoking about 2 years ago. Her smoking use included cigarettes. She has a 11.00 pack-year smoking history. She has never used smokeless tobacco. She reports that she does not currently use alcohol after a past usage of about 2.0 standard drinks per week. She reports that she does not currently use drugs after having used the following drugs: Cocaine. Allergies  Allergen Reactions   Shrimp [Shellfish Allergy] Shortness Of Breath   Bactroban [Mupirocin] Other (See Comments)    "Sores in nose"   Vicodin [Hydrocodone-Acetaminophen] Itching and Nausea And Vomiting    This is patient's home medication   Eliquis [Apixaban] Itching    Spoke with patient, no rash   Lisinopril Cough   Tylenol [Acetaminophen] Itching   Prior to Admission medications   Medication Sig Start Date End Date Taking? Authorizing Provider  albuterol (VENTOLIN HFA) 108 (90 Base) MCG/ACT inhaler Inhale 2 puffs into the lungs every 6 (six) hours as needed for wheezing or shortness of breath. 07/30/20  Yes Maximiano Coss, NP  amLODipine (NORVASC) 10 MG tablet Take 1  tablet (10 mg total) by mouth daily. 02/14/21  Yes Maximiano Coss, NP  apixaban (ELIQUIS) 2.5 MG TABS tablet Take 1 tablet (2.5 mg total) by mouth 2 (two) times daily. 06/09/21  Yes Caren Griffins, MD  cloNIDine (CATAPRES - DOSED IN MG/24 HR) 0.1 mg/24hr patch Place 1 patch (0.1 mg total) onto the skin every Saturday. 06/14/21  Yes Gherghe, Vella Redhead, MD  cyclobenzaprine (FLEXERIL) 5 MG tablet Take 5 mg by mouth 2 (two) times daily as needed for muscle spasms. 09/18/20  Yes [provider]  hydrALAZINE (APRESOLINE) 50 MG tablet Take 1 tablet (50 mg total) by mouth 3 (three) times daily. Patient taking differently: Take 50 mg by mouth in the morning and at bedtime. 02/14/21  Yes Maximiano Coss, NP  loperamide (IMODIUM) 2 MG capsule Take 2 mg by mouth as needed for diarrhea or loose stools. 07/15/20  Yes  [provider]  losartan (COZAAR) 100 MG tablet Take 1 tablet (100 mg total) by mouth daily. 02/14/21  Yes Maximiano Coss, NP  mirtazapine (REMERON) 7.5 MG tablet Take 1 tablet (7.5 mg total) by mouth at bedtime. Patient taking differently: Take 7.5 mg by mouth at bedtime as needed (sleep). 07/15/20  Yes Maximiano Coss, NP  [facility] 4 MG/0.1ML LIQD nasal spray kit Place 1 spray into the nose as needed for opioid reversal. If no response within 3 minutes then repeat once in the other nostril.  Call 911. 05/15/20  Yes [provider]  ondansetron (ZOFRAN) 4 MG tablet Take 1 tablet (4 mg total) by mouth every 8 (eight) hours as needed for nausea or vomiting. 08/03/19  Yes Jacelyn Pi, Lilia Argue, MD  pregabalin (LYRICA) 50 MG capsule Take 100 mg by mouth at bedtime. 05/07/21  Yes [provider]  rosuvastatin (CRESTOR) 10 MG tablet Take 1 tablet (10 mg total) by mouth daily. 02/14/21 06/19/21 Yes Maximiano Coss, NP  sevelamer carbonate (RENVELA) 2.4 g PACK Take 2.4 g by mouth 3 (three) times daily with meals. 05/15/21  Yes [provider]  torsemide (DEMADEX) 20 MG tablet Take 40 mg by mouth daily. 05/19/21  Yes [provider]  VELPHORO 500 MG chewable tablet Chew 1,000 mg by mouth 3 (three) times daily. 05/19/21  Yes [provider]  VITAMIN D PO Take 1 tablet by mouth daily.   Yes [provider]   Current Facility-Administered Medications  Medication Dose Route Frequency Provider Last Rate Last Admin   0.9 %  sodium chloride infusion  250 mL Intravenous PRN Derrill Kay A, MD       albuterol (PROVENTIL) (2.5 MG/3ML) 0.083% nebulizer solution 2.5 mg  2.5 mg Inhalation Q6H PRN Phillips Grout, MD       amLODipine (NORVASC) tablet 10 mg  10 mg Oral Daily Derrill Kay A, MD   10 mg at 06/19/21 0998   apixaban (ELIQUIS) tablet 2.5 mg  2.5 mg Oral BID Derrill Kay A, MD   2.5 mg at 06/19/21 3382   Chlorhexidine Gluconate Cloth 2 % PADS 6 each   6 each Topical Q0600 Lynnda Child, PA-C       [START ON 06/21/2021] cloNIDine (CATAPRES - Dosed in mg/24 hr) patch 0.1 mg  0.1 mg Transdermal Q Sat Derrill Kay A, MD       cyclobenzaprine (FLEXERIL) tablet 5 mg  5 mg Oral BID PRN Phillips Grout, MD       hydrALAZINE (APRESOLINE) tablet 50 mg  50 mg Oral TID Derrill Kay  A, MD   50 mg at 06/19/21 0851   loperamide (IMODIUM) capsule 2 mg  2 mg Oral PRN Phillips Grout, MD       losartan (COZAAR) tablet 100 mg  100 mg Oral Daily Derrill Kay A, MD   100 mg at 06/19/21 7471   mirtazapine (REMERON) tablet 7.5 mg  7.5 mg Oral QHS PRN Phillips Grout, MD       ondansetron [facility]) tablet 4 mg  4 mg Oral Q8H PRN Phillips Grout, MD       pregabalin (LYRICA) capsule 100 mg  100 mg Oral QHS Derrill Kay A, MD       rosuvastatin (CRESTOR) tablet 10 mg  10 mg Oral Daily Derrill Kay A, MD   10 mg at 06/19/21 0852   sodium chloride flush (NS) 0.9 % injection 3 mL  3 mL Intravenous Q12H Derrill Kay A, MD   3 mL at 06/19/21 0811   sodium chloride flush (NS) 0.9 % injection 3 mL  3 mL Intravenous PRN Phillips Grout, MD       sucroferric oxyhydroxide [facility]) chewable tablet 1,000 mg  1,000 mg Oral TID WC Phillips Grout, MD       torsemide [facility]) tablet 40 mg  40 mg Oral Daily Derrill Kay A, MD   40 mg at 06/19/21 5953   Current Outpatient Medications  Medication Sig Dispense Refill   albuterol (VENTOLIN HFA) 108 (90 Base) MCG/ACT inhaler Inhale 2 puffs into the lungs every 6 (six) hours as needed for wheezing or shortness of breath. 8.5 g 0   amLODipine (NORVASC) 10 MG tablet Take 1 tablet (10 mg total) by mouth daily. 90 tablet 0   apixaban (ELIQUIS) 2.5 MG TABS tablet Take 1 tablet (2.5 mg total) by mouth 2 (two) times daily. 60 tablet 0   cloNIDine (CATAPRES - DOSED IN MG/24 HR) 0.1 mg/24hr patch Place 1 patch (0.1 mg total) onto the skin every Saturday. 4 patch 12   cyclobenzaprine (FLEXERIL) 5 MG tablet Take 5 mg by mouth 2 (two)  times daily as needed for muscle spasms.     hydrALAZINE (APRESOLINE) 50 MG tablet Take 1 tablet (50 mg total) by mouth 3 (three) times daily. (Patient taking differently: Take 50 mg by mouth in the morning and at bedtime.) 270 tablet 0   loperamide (IMODIUM) 2 MG capsule Take 2 mg by mouth as needed for diarrhea or loose stools.     losartan (COZAAR) 100 MG tablet Take 1 tablet (100 mg total) by mouth daily. 90 tablet 0   mirtazapine (REMERON) 7.5 MG tablet Take 1 tablet (7.5 mg total) by mouth at bedtime. (Patient taking differently: Take 7.5 mg by mouth at bedtime as needed (sleep).) 90 tablet 3   NARCAN 4 MG/0.1ML LIQD nasal spray kit Place 1 spray into the nose as needed for opioid reversal. If no response within 3 minutes then repeat once in the other nostril.  Call 911.     ondansetron (ZOFRAN) 4 MG tablet Take 1 tablet (4 mg total) by mouth every 8 (eight) hours as needed for nausea or vomiting. 20 tablet 3   pregabalin (LYRICA) 50 MG capsule Take 100 mg by mouth at bedtime.     rosuvastatin (CRESTOR) 10 MG tablet Take 1 tablet (10 mg total) by mouth daily. 90 tablet 0   sevelamer carbonate (RENVELA) 2.4 g PACK Take 2.4 g by mouth 3 (three) times daily with meals.  torsemide (DEMADEX) 20 MG tablet Take 40 mg by mouth daily.     VELPHORO 500 MG chewable tablet Chew 1,000 mg by mouth 3 (three) times daily.     VITAMIN D PO Take 1 tablet by mouth daily.       ROS: As per HPI otherwise negative.  Physical Exam: Vitals:   06/19/21 0900 06/19/21 0930 06/19/21 1000 06/19/21 1030  BP: (!) 202/129 (!) 201/125 (!) 170/98 (!) 181/112  Pulse: 90 85 84 86  Resp: (!) 29 (!) 23 (!) 24 (!) 26  Temp:      SpO2: 100% 100% 100% 100%  Weight:      Height:         General: Ill appearing, nad Head: NCAT sclera not icteric MMM Neck: Supple. No JVD appreciated  Lungs: Diminished, faint crackles at bases  Heart: RRR, no murmur, rub, or gallop  Abdomen: soft non-tender, bowel sounds normal, no  masses  Lower extremities: Trace LE edema  Neuro: A & O X 3. Moves all extremities spontaneously. Psych:  Responds to questions appropriately with a normal affect. Dialysis Access: R [facility]   Labs: Basic Metabolic Panel: Recent Labs  Lab 06/19/21 0155  NA 140  K 5.6*  CL 105  CO2 20*  GLUCOSE 115*  BUN 98*  CREATININE 12.90*  CALCIUM 8.7*   Liver Function Tests: Recent Labs  Lab 06/19/21 0155  AST 17  ALT 12  ALKPHOS 89  BILITOT 0.5  PROT 6.9  ALBUMIN 3.3*   No results for input(s): LIPASE, AMYLASE in the last 168 hours. No results for input(s): AMMONIA in the last 168 hours. CBC: Recent Labs  Lab 06/19/21 0155  WBC 6.3  NEUTROABS 4.7  HGB 8.7*  HCT 28.2*  MCV 94.9  PLT 282   Cardiac Enzymes: No results for input(s): CKTOTAL, CKMB, CKMBINDEX, TROPONINI in the last 168 hours. CBG: No results for input(s): GLUCAP in the last 168 hours. Iron Studies: No results for input(s): IRON, TIBC, TRANSFERRIN, FERRITIN in the last 72 hours. Studies/Results: DG Chest 2 View  Result Date: 06/19/2021 CLINICAL DATA:  Cough and congestion. EXAM: CHEST - 2 VIEW COMPARISON:  06/07/2021. FINDINGS: The heart is enlarged and the mediastinal contour is stable. Atherosclerotic calcification of the aorta is noted. The pulmonary vasculature is mildly distended. Interstitial prominence is noted in the mid to lower lung fields bilaterally. No effusion or pneumothorax is seen. Sternotomy wires are present over the midline. A right internal jugular central venous catheter is stable. IMPRESSION: 1. Cardiomegaly with mildly distended pulmonary vasculature. 2. Interstitial prominence in the mid to lower lung fields bilaterally, possible edema or infiltrate. Electronically Signed   By: Brett Fairy M.D.   On: 06/19/2021 02:25   CT Angio Chest Pulmonary Embolism (PE) W or WO Contrast  Result Date: 06/19/2021 CLINICAL DATA:  Chest pain or SOB, pleurisy or effusion suspected EXAM: CT ANGIOGRAPHY CHEST  WITH CONTRAST TECHNIQUE: Multidetector CT imaging of the chest was performed using the standard protocol during bolus administration of intravenous contrast. Multiplanar CT image reconstructions and MIPs were obtained to evaluate the vascular anatomy. RADIATION DOSE REDUCTION: This exam was performed according to the departmental dose-optimization program which includes automated exposure control, adjustment of the mA and/or kV according to patient size and/or use of iterative reconstruction technique. CONTRAST:  32mL OMNIPAQUE IOHEXOL 350 MG/ML SOLN COMPARISON:  06/05/2021 FINDINGS: Cardiovascular: Preferential opacification of the pulmonary arteries with satisfactory opacification to the segmental level. No evidence of pulmonary embolism. Stable cardiomegaly.  No pericardial effusion. Mediastinum/Nodes: No enlarged nodes identified. Lungs/Pleura: Persistent small right pleural effusion has decreased since the prior study. Lung aeration has improved with significantly reduced ground-glass opacities. There is bibasilar atelectasis. Upper Abdomen: No acute abnormality. Musculoskeletal: Body wall edema.  No acute osseous abnormality. Review of the MIP images confirms the above findings. IMPRESSION: No evidence of acute pulmonary embolism. Significantly improved lung aeration. Decreased, now small right pleural effusion. Stable cardiomegaly. Electronically Signed   By: Macy Mis M.D.   On: 06/19/2021 09:04    Dialysis Orders:  Unit: AF TTS  Time: 3:45  EDW: 45 kg  Flows: 400/A1.5x Bath: 2K/2Ca  Access: TDC  Heparin: 2000  ESA: Mircera 150  q 2 wks (start 1/19)  Fe: Venofer 50 q wk  VDRA: Hectorol 3 TIW   Assessment/Plan:  Dyspnea/chest pain --CTA negative for PE. Likely related to volume overload. Optimize volume with HD today.   ESRD -  HD TTS. Continue on schedule   Hypertension/volume  - Hypertensive. Continue home meds. UF with HD. 3-3.5L today   Anemia  - Hgb 8.7. Will give Aranesp with HD  today   Metabolic bone disease -  Ca/Phos ok. Continue home binders, hectorol   Nutrition - Renal diet with fluid restriction   Lynnda Child PA-C [facility] 06/19/2021, 12:02 PM

## 2021-06-19 NOTE — ED Provider Triage Note (Signed)
Emergency Medicine Provider Triage Evaluation Note  Monique Henderson , a 71 y.o. female  was evaluated in triage.  Pt complains of weakness.  Patient has a history of ESRD on HD and was recently admitted for COVID-19.  She states that since being discharged she has continued to feel weak.  Reports a persistent cough.  She notes chest pain when coughing.  No shortness of breath.  Nausea without vomiting.  Also complains of pruritus.  She states that her last dialysis session was on Tuesday of this week and was normal.  Physical Exam  BP (!) 227/139    Pulse (!) 104    Temp 97.7 F (36.5 C)    Resp 18    Ht 5\' 3"  (1.6 m)    Wt 48.2 kg    SpO2 100%    BMI 18.82 kg/m  Gen:   Awake, no distress   Resp:  Normal effort  MSK:   Moves extremities without difficulty  Other:    Medical Decision Making  Medically screening exam initiated at 1:50 AM.  Appropriate orders placed.  Monique Henderson was informed that the remainder of the evaluation will be completed by another provider, this initial triage assessment does not replace that evaluation, and the importance of remaining in the ED until their evaluation is complete.   Rayna Sexton, PA-C 06/19/21 0151

## 2021-06-19 NOTE — ED Notes (Signed)
Pt was able to ambulate to the restroom.  

## 2021-06-19 NOTE — H&P (Signed)
History and Physical    Monique Henderson DOB: Apr 14, 1951 DOA: 06/19/2021  PCP: Myrtie Neither, PA-C  Patient coming from: Home  Chief Complaint: Chest pain  HPI: Monique Henderson is a 71 y.o. female with medical history significant of recent hospitalization for COVID, end-stage renal disease on hemodialysis, hypertension, medical noncompliance comes in with pleuritic chest pain from coughing.  Patient was just discharged 5 to 6 days ago had her routine dialysis Tuesday she is due for dialysis today.  She denies any fevers.  She has been coughing still a lot.  At the time of discharge she was on room air not requiring any supplemental oxygen.  She went home and has not taken any of her medications because she did not feel like it.  There is been no nausea vomiting diarrhea.  She has had some swelling in her legs which is chronic.  She has also been short of breath.  Patient be referred for admission for chest pain in the setting of needing hemodialysis.   Review of Systems: As per HPI otherwise 10 point review of systems negative.   Past Medical History:  Diagnosis Date   AF (paroxysmal atrial fibrillation) ([place]) 05/29/2019   on Coumadin   Aortic atherosclerosis ([place]) 07/05/2019   Aortic dissection ([place]) 04/04/2019   s/p repair   Bone spur 2008   Right calcaneal foot spur   Breast cancer ([place]) 2004   Ductal carcinoma in situ of the left breast; S/P left partial mastectomy 02/26/2003; S/P re-excision of cranial and lateral margins11/18/2004.radiation   Cerebral thrombosis with cerebral infarction 05/22/2019   Chronic low back pain 06/22/2016   Chronic obstructive lung disease ([place]) 01/16/2017   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 Autumn Messing; S/P re-excision of superior and inferior margins 10/27/2012.    ESRD on dialysis ([place]) 05/29/2019   Essential hypertension 09/16/2006   GERD 09/16/2006   Hepatitis C     treated and RNA confirmed not detectable 01/2017   Insomnia 03/14/2015   Malnutrition of moderate degree 05/19/2019   Non compliance with medical treatment 12/04/2017   Normocytic anemia    With thrombocytosis   Osteoarthritis    Right ureteral stone 2002   S/P lumbar spinal fusion 01/18/2014   S/P lumbar decompressive laminectomy, fusion, and plating for lumbar spinal stenosis on 05/27/2009 by Dr. Eustace Moore.  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 Eustace Moore.     Tobacco use disorder 04/19/2009   Uterine fibroid    Wears dentures    top    Past Surgical History:  Procedure Laterality Date   ANTERIOR LAT LUMBAR FUSION N/A 01/18/2014   Procedure: ANTERIOR LATERAL LUMBAR FUSION LUMBAR TWO-THREE;  Surgeon: Eustace Moore, MD;  Location: [facility];  Service: Neurosurgery;  Laterality: N/A;   ANTERIOR LUMBAR FUSION  01/18/2014   AV FISTULA PLACEMENT Left 04/20/2019   Procedure: ARTERIOVENOUS (AV) FISTULA CREATION;  Surgeon: [name], MD;  Location: [facility];  Service: Vascular;  Laterality: Left;   BACK SURGERY     BREAST LUMPECTOMY Left 01/2003   BREAST LUMPECTOMY Right 2014   BREAST LUMPECTOMY WITH NEEDLE LOCALIZATION AND AXILLARY SENTINEL LYMPH NODE BX Right 10/13/2012   Procedure: BREAST LUMPECTOMY WITH NEEDLE LOCALIZATION;  Surgeon:  [name], MD;  Location: [place];  Service: General;  Laterality: Right;  Right breast wire localized lumpectomy   INSERTION OF DIALYSIS CATHETER Right 04/20/2019   Procedure: INSERTION OF DIALYSIS CATHETER, right internal jugular;  Surgeon: [name], MD;  Location: [facility];  Service: Vascular;  Laterality: Right;   INSERTION OF DIALYSIS CATHETER Right 09/24/2020   Procedure: INSERTION OF TUNNELED  DIALYSIS CATHETER;  Surgeon: Rosetta Posner, MD;  Location: [facility];  Service: Vascular;  Laterality: Right;   IR FLUORO GUIDE CV LINE RIGHT  09/22/2020   IR THORACENTESIS ASP PLEURAL SPACE W/IMG GUIDE  05/19/2019   IR US GUIDE VASC ACCESS LEFT  09/22/2020   IR US GUIDE VASC ACCESS RIGHT  09/22/2020   IR VENOCAVAGRAM SVC  09/22/2020   LAMINECTOMY  05/27/2009   Lumbar decompressive laminectomy, fusion and plating for lumbar spinal stensosis   LIGATION OF ARTERIOVENOUS  FISTULA Left 09/24/2020   Procedure: LIGATION OF LEFT ARM ARTERIOVENOUS  FISTULA;  Surgeon: Rosetta Posner, MD;  Location: [place];  Service: Vascular;  Laterality: Left;   LUMBAR LAMINECTOMY/DECOMPRESSION MICRODISCECTOMY Left 03/23/2013   Procedure: LUMBAR LAMINECTOMY/DECOMPRESSION MICRODISCECTOMY 1 LEVEL;  Surgeon: Eustace Moore, MD;  Location: [facility];  Service: Neurosurgery;  Laterality: Left;  LUMBAR LAMINECTOMY/DECOMPRESSION MICRODISCECTOMY 1 LEVEL   MASTECTOMY, PARTIAL Left 02/26/2003   ; S/P re-excision of cranial and lateral margins 04/19/2003.    RE-EXCISION OF BREAST CANCER,SUPERIOR MARGINS Right 10/27/2012   Procedure: RE-EXCISION OF BREAST CANCER,SUPERIOR and inferior MARGINS;  Surgeon: [name], MD;  Location: [facility];  Service: General;  Laterality: Right;   RE-EXCISION OF BREAST LUMPECTOMY Left 04/2003   TEE WITHOUT CARDIOVERSION N/A 04/04/2019   Procedure: Transesophageal Echocardiogram ([place]);  Surgeon: Wonda Olds, MD;  Location: [facility];  Service: Open Heart Surgery;  Laterality: N/A;   THORACIC AORTIC ANEURYSM REPAIR N/A 04/04/2019   Procedure: THORACIC ASCENDING ANEURYSM REPAIR (AAA)  USING 28 MM X 30 CM HEMASHIELD PLATINUM VASCULAR GRAFT;  Surgeon: Wonda Olds, MD;  Location: [facility];  Service: Open Heart Surgery;  Laterality: N/A;     reports that she quit smoking about 2 years ago. Her smoking use included cigarettes. She has a 11.00 pack-year smoking history. She has never used smokeless tobacco. She  reports that she does not currently use alcohol after a past usage of about 2.0 standard drinks per week. She reports that she does not currently use drugs after having used the following drugs: Cocaine.  Allergies  Allergen Reactions   Shrimp [Shellfish Allergy] Shortness Of Breath   Bactroban [Mupirocin] Other (See Comments)    "Sores in nose"   Vicodin [Hydrocodone-Acetaminophen] Itching and Nausea And Vomiting    This is patient's home medication   Eliquis [Apixaban] Itching    Spoke with patient, no rash   Lisinopril Cough   Tylenol [Acetaminophen] Itching    Family History  Problem Relation Age of Onset   Colon cancer Mother 59   Hypertension Mother    Diabetes Sister 53   Hypertension Sister    Diabetes Brother    Hypertension Brother    Diabetes Brother    Hypertension Brother    Kidney disease Son        s/p renal transplant   Hypertension Son    Diabetes Son    Multiple sclerosis Son    Bone cancer Sister 17   Breast cancer Neg Hx    Cervical cancer Neg  Hx     Prior to Admission medications   Medication Sig Start Date End Date Taking? Authorizing Provider  acetaminophen (TYLENOL) 325 MG tablet Take 650 mg by mouth every 6 (six) hours as needed for mild pain.    [provider]  albuterol (VENTOLIN HFA) 108 (90 Base) MCG/ACT inhaler Inhale 2 puffs into the lungs every 6 (six) hours as needed for wheezing or shortness of breath. 07/30/20   Maximiano Coss, NP  amLODipine (NORVASC) 10 MG tablet Take 1 tablet (10 mg total) by mouth daily. 02/14/21   Maximiano Coss, NP  apixaban (ELIQUIS) 2.5 MG TABS tablet Take 1 tablet (2.5 mg total) by mouth 2 (two) times daily. 06/09/21   Caren Griffins, MD  cloNIDine (CATAPRES - DOSED IN MG/24 HR) 0.1 mg/24hr patch Place 1 patch (0.1 mg total) onto the skin every Saturday. 06/14/21   Caren Griffins, MD  cyclobenzaprine (FLEXERIL) 5 MG tablet Take 5 mg by mouth 2 (two) times daily as needed for muscle spasms. 09/18/20    [provider]  hydrALAZINE (APRESOLINE) 50 MG tablet Take 1 tablet (50 mg total) by mouth 3 (three) times daily. Patient taking differently: Take 50 mg by mouth in the morning and at bedtime. 02/14/21   Maximiano Coss, NP  loperamide (IMODIUM) 2 MG capsule Take 2 mg by mouth as needed for diarrhea or loose stools. 07/15/20   [provider]  losartan (COZAAR) 100 MG tablet Take 1 tablet (100 mg total) by mouth daily. 02/14/21   Maximiano Coss, NP  mirtazapine (REMERON) 7.5 MG tablet Take 1 tablet (7.5 mg total) by mouth at bedtime. Patient taking differently: Take 7.5 mg by mouth at bedtime as needed (sleep). 07/15/20   Maximiano Coss, NP  [facility] 4 MG/0.1ML LIQD nasal spray kit Place 1 spray into the nose as needed for opioid reversal. If no response within 3 minutes then repeat once in the other nostril.  Call 911. 05/15/20   [provider]  ondansetron (ZOFRAN) 4 MG tablet Take 1 tablet (4 mg total) by mouth every 8 (eight) hours as needed for nausea or vomiting. 08/03/19   Daleen Squibb, MD  pregabalin (LYRICA) 50 MG capsule Take 100 mg by mouth at bedtime. 05/07/21   [provider]  rosuvastatin (CRESTOR) 10 MG tablet Take 1 tablet (10 mg total) by mouth daily. 02/14/21 06/05/21  Maximiano Coss, NP  torsemide (DEMADEX) 20 MG tablet Take 40 mg by mouth daily. 05/19/21   [provider]  VELPHORO 500 MG chewable tablet Chew 1,000 mg by mouth 3 (three) times daily. 05/19/21   [provider]  VITAMIN D PO Take 1 tablet by mouth daily.    [provider]    Physical Exam: Vitals:   06/19/21 0600 06/19/21 0630 06/19/21 0653 06/19/21 0700  BP: (!) 203/128 (!) 208/129 (!) 199/125 (!) 186/137  Pulse: 80 83 83 80  Resp: (!) 21 (!) 0 18 (!) 21  Temp:      SpO2: 97% 100% 99% 100%  Weight:      Height:          Constitutional: NAD, calm, comfortable Vitals:   06/19/21 0600 06/19/21 0630 06/19/21 0653 06/19/21 0700  BP: (!)  203/128 (!) 208/129 (!) 199/125 (!) 186/137  Pulse: 80 83 83 80  Resp: (!) 21 (!) 0 18 (!) 21  Temp:      SpO2: 97% 100% 99% 100%  Weight:      Height:  Eyes: PERRL, lids and conjunctivae normal ENMT: Mucous membranes are moist. Posterior pharynx clear of any exudate or lesions.Normal dentition.  Neck: normal, supple, no masses, no thyromegaly Respiratory: clear to auscultation bilaterally, no wheezing, no crackles. Normal respiratory effort. No accessory muscle use.  Cardiovascular: Regular rate and rhythm, no murmurs / rubs / gallops.  1+ pitting extremity edema. 2+ pedal pulses. No carotid bruits.  Abdomen: no tenderness, no masses palpated. No hepatosplenomegaly. Bowel sounds positive.  Musculoskeletal: no clubbing / cyanosis. No joint deformity upper and lower extremities. Good ROM, no contractures. Normal muscle tone.  Skin: no rashes, lesions, ulcers. No induration Neurologic: CN 2-12 grossly intact. Sensation intact, DTR normal. Strength 5/5 in all 4.  Psychiatric: Normal judgment and insight. Alert and oriented x 3. Normal mood.    Labs on Admission: I have personally reviewed following labs and imaging studies  CBC: Recent Labs  Lab 06/19/21 0155  WBC 6.3  NEUTROABS 4.7  HGB 8.7*  HCT 28.2*  MCV 94.9  PLT 027   Basic Metabolic Panel: Recent Labs  Lab 06/19/21 0155  NA 140  K 5.6*  CL 105  CO2 20*  GLUCOSE 115*  BUN 98*  CREATININE 12.90*  CALCIUM 8.7*   GFR: Estimated Creatinine Clearance: 3.2 mL/min (A) (by C-G formula based on SCr of 12.9 mg/dL (H)). Liver Function Tests: Recent Labs  Lab 06/19/21 0155  AST 17  ALT 12  ALKPHOS 89  BILITOT 0.5  PROT 6.9  ALBUMIN 3.3*   No results for input(s): LIPASE, AMYLASE in the last 168 hours. No results for input(s): AMMONIA in the last 168 hours. Coagulation Profile: No results for input(s): INR, PROTIME in the last 168 hours. Cardiac Enzymes: No results for input(s): CKTOTAL, CKMB, CKMBINDEX,  TROPONINI in the last 168 hours. BNP (last 3 results) No results for input(s): PROBNP in the last 8760 hours. HbA1C: No results for input(s): HGBA1C in the last 72 hours. CBG: No results for input(s): GLUCAP in the last 168 hours. Lipid Profile: No results for input(s): CHOL, HDL, LDLCALC, TRIG, CHOLHDL, LDLDIRECT in the last 72 hours. Thyroid Function Tests: No results for input(s): TSH, T4TOTAL, FREET4, T3FREE, THYROIDAB in the last 72 hours. Anemia Panel: No results for input(s): VITAMINB12, FOLATE, FERRITIN, TIBC, IRON, RETICCTPCT in the last 72 hours. Urine analysis:    Component Value Date/Time   COLORURINE STRAW (A) 09/21/2020 2018   APPEARANCEUR HAZY (A) 09/21/2020 2018   LABSPEC 1.010 09/21/2020 2018   PHURINE 7.0 09/21/2020 2018   GLUCOSEU NEGATIVE 09/21/2020 2018   GLUCOSEU NEG mg/dL 10/30/2009 1944   HGBUR NEGATIVE 09/21/2020 2018   BILIRUBINUR NEGATIVE 09/21/2020 2018   BILIRUBINUR negative 05/04/2018 0910   KETONESUR NEGATIVE 09/21/2020 2018   PROTEINUR 100 (A) 09/21/2020 2018   UROBILINOGEN 0.2 05/04/2018 0910   UROBILINOGEN 1 02/14/2014 0946   NITRITE NEGATIVE 09/21/2020 2018   LEUKOCYTESUR LARGE (A) 09/21/2020 2018   Sepsis Labs: !!!!!!!!!!!!!!!!!!!!!!!!!!!!!!!!!!!!!!!!!!!! _0 (procalcitonin:4,lacticidven:4) )No results found for this or any previous visit (from the past 240 hour(s)).   Radiological Exams on Admission: DG Chest 2 View  Result Date: 06/19/2021 CLINICAL DATA:  Cough and congestion. EXAM: CHEST - 2 VIEW COMPARISON:  06/07/2021. FINDINGS: The heart is enlarged and the mediastinal contour is stable. Atherosclerotic calcification of the aorta is noted. The pulmonary vasculature is mildly distended. Interstitial prominence is noted in the mid to lower lung fields bilaterally. No effusion or pneumothorax is seen. Sternotomy wires are present over the midline. A right internal jugular central  venous catheter is stable. IMPRESSION: 1. Cardiomegaly  with mildly distended pulmonary vasculature. 2. Interstitial prominence in the mid to lower lung fields bilaterally, possible edema or infiltrate. Electronically Signed   By: Brett Fairy M.D.   On: 06/19/2021 02:25    EKG: Independently reviewed.  Normal sinus rhythm no acute changes  Assessment/Plan  71 year old female end-stage renal disease recent COVID infection comes in with pleuritic chest pain  Principal Problem:    Chest pain-proceed with CTA of chest to rule out PE certainly patient high risk she is plus to be anticoagulated however she is not taking this.  Troponins mildly elevated which is chronic and at baseline and flat.  EKG normal sinus rhythm nonischemic.  Obtain cardiac echo.  Active Problems:    Malignant hypertension-resume home meds    Chronic obstructive lung disease (HCC)-lungs clear at this time continue chronic bronchodilators    Non compliance with medical treatment-noted    History of CVA (cerebrovascular accident)-noted    ESRD on dialysis (HCC)-nephrology consulted planning on doing dialysis today     Further recommendations pending on overall hospital course   DVT prophylaxis: On Eliquis Code Status: Full Family Communication: None Disposition Plan: 1 to 2 days Consults called: Nephrology Admission status: Observation   Humberto Addo A MD [facility]  If 7PM-7AM, please contact night-coverage [email or web address] Password [facility]  06/19/2021, 7:17 AM

## 2021-06-19 NOTE — Progress Notes (Signed)
°  Echocardiogram 2D Echocardiogram has been performed.  Fidel Levy 06/19/2021, 12:03 PM

## 2021-06-19 NOTE — ED Provider Notes (Signed)
[facility] EMERGENCY DEPARTMENT Provider Note   CSN: [id number] Arrival date & time: 06/19/21  0135     History  Chief Complaint  Patient presents with   Cough    Monique Henderson is a 71 y.o. female.  The history is provided by the patient and medical records.  Cough Monique Henderson is a 71 y.o. female who presents to the Emergency Department complaining of chest pain. She presents to the ED by EMS for evaluation of chest pain.  Pain started on Friday when she was discharged from the hospital following hospitalization for covid 19. Chest pain is central, sharp in nature and worse with cough.  Pain has worsened since hospital discharge.  Had a fever two days ago to 103.  Cough is productive of yellow sputum.  Has associated sob and has to sit up to sleep.  Has BLE edema that started today.  Has nausea.    Has ESRD on HD, dialyzes T, TH, Sat.  Last session Tuesday and received full session.    Has a hx/o HTN.  Did not take any medications since hospital discharge.  States she didn't take them because she did not feel like it.      Home Medications Prior to Admission medications   Medication Sig Start Date End Date Taking? Authorizing Provider  acetaminophen (TYLENOL) 325 MG tablet Take 650 mg by mouth every 6 (six) hours as needed for mild pain.    [provider]  albuterol (VENTOLIN HFA) 108 (90 Base) MCG/ACT inhaler Inhale 2 puffs into the lungs every 6 (six) hours as needed for wheezing or shortness of breath. 07/30/20   Maximiano Coss, NP  amLODipine (NORVASC) 10 MG tablet Take 1 tablet (10 mg total) by mouth daily. 02/14/21   Maximiano Coss, NP  apixaban (ELIQUIS) 2.5 MG TABS tablet Take 1 tablet (2.5 mg total) by mouth 2 (two) times daily. 06/09/21   Caren Griffins, MD  cloNIDine (CATAPRES - DOSED IN MG/24 HR) 0.1 mg/24hr patch Place 1 patch (0.1 mg total) onto the skin every Saturday. 06/14/21   Caren Griffins, MD  cyclobenzaprine  (FLEXERIL) 5 MG tablet Take 5 mg by mouth 2 (two) times daily as needed for muscle spasms. 09/18/20   [provider]  hydrALAZINE (APRESOLINE) 50 MG tablet Take 1 tablet (50 mg total) by mouth 3 (three) times daily. Patient taking differently: Take 50 mg by mouth in the morning and at bedtime. 02/14/21   Maximiano Coss, NP  loperamide (IMODIUM) 2 MG capsule Take 2 mg by mouth as needed for diarrhea or loose stools. 07/15/20   [provider]  losartan (COZAAR) 100 MG tablet Take 1 tablet (100 mg total) by mouth daily. 02/14/21   Maximiano Coss, NP  mirtazapine (REMERON) 7.5 MG tablet Take 1 tablet (7.5 mg total) by mouth at bedtime. Patient taking differently: Take 7.5 mg by mouth at bedtime as needed (sleep). 07/15/20   Maximiano Coss, NP  [facility] 4 MG/0.1ML LIQD nasal spray kit Place 1 spray into the nose as needed for opioid reversal. If no response within 3 minutes then repeat once in the other nostril.  Call 911. 05/15/20   [provider]  ondansetron (ZOFRAN) 4 MG tablet Take 1 tablet (4 mg total) by mouth every 8 (eight) hours as needed for nausea or vomiting. 08/03/19   Daleen Squibb, MD  pregabalin (LYRICA) 50 MG capsule Take 100 mg by mouth at bedtime. 05/07/21   [provider]  rosuvastatin (CRESTOR) 10 MG tablet Take 1 tablet (10 mg total) by mouth daily. 02/14/21 06/05/21  Maximiano Coss, NP  torsemide (DEMADEX) 20 MG tablet Take 40 mg by mouth daily. 05/19/21   [provider]  VELPHORO 500 MG chewable tablet Chew 1,000 mg by mouth 3 (three) times daily. 05/19/21   [provider]  VITAMIN D PO Take 1 tablet by mouth daily.    [provider]      Allergies    Shrimp [shellfish allergy], Bactroban [mupirocin], Vicodin [hydrocodone-acetaminophen], Eliquis [apixaban], Lisinopril, and Tylenol [acetaminophen]    Review of Systems   Review of Systems  Respiratory:  Positive for cough.   All other systems reviewed and are  negative.  Physical Exam Updated Vital Signs BP (!) 208/129    Pulse 83    Temp 97.7 F (36.5 C)    Resp (!) 0    Ht $R'5\' 3"'KQ$  (1.6 m)    Wt 48.2 kg    SpO2 100%    BMI 18.82 kg/m  Physical Exam Vitals and nursing note reviewed.  Constitutional:      Appearance: She is well-developed.  HENT:     Head: Normocephalic and atraumatic.  Cardiovascular:     Rate and Rhythm: Regular rhythm. Tachycardia present.     Heart sounds: No murmur heard. Pulmonary:     Effort: Pulmonary effort is normal. No respiratory distress.     Breath sounds: Normal breath sounds.  Abdominal:     Palpations: Abdomen is soft.     Tenderness: There is no guarding or rebound.     Comments: Moderate epigastric tenderness  Musculoskeletal:        General: No tenderness.     Comments: Trace edema to BLE  Skin:    General: Skin is warm and dry.  Neurological:     Mental Status: She is alert and oriented to person, place, and time.  Psychiatric:        Behavior: Behavior normal.    ED Results / Procedures / Treatments   Labs (all labs ordered are listed, but only abnormal results are displayed) Labs Reviewed  COMPREHENSIVE METABOLIC PANEL - Abnormal; Notable for the following components:      Result Value   Potassium 5.6 (*)    CO2 20 (*)    Glucose, Bld 115 (*)    BUN 98 (*)    Creatinine, Ser 12.90 (*)    Calcium 8.7 (*)    Albumin 3.3 (*)    GFR, Estimated 3 (*)    All other components within normal limits  CBC WITH DIFFERENTIAL/PLATELET - Abnormal; Notable for the following components:   RBC 2.97 (*)    Hemoglobin 8.7 (*)    HCT 28.2 (*)    RDW 20.7 (*)    All other components within normal limits  TROPONIN I (HIGH SENSITIVITY) - Abnormal; Notable for the following components:   Troponin I (High Sensitivity) 354 (*)    All other components within normal limits  TROPONIN I (HIGH SENSITIVITY) - Abnormal; Notable for the following components:   Troponin I (High Sensitivity) 310 (*)    All other  components within normal limits  CULTURE, BLOOD (ROUTINE X 2)  CULTURE, BLOOD (ROUTINE X 2)  LACTIC ACID, PLASMA    EKG EKG Interpretation  Date/Time:  Thursday June 19 2021 01:58:41 EST Ventricular Rate:  98 PR Interval:  152 QRS Duration: 78 QT Interval:  354 QTC Calculation: 451 R Axis:   -  7 Text Interpretation: Normal sinus rhythm Biatrial enlargement Nonspecific ST and T wave abnormality Abnormal ECG Confirmed by Quintella Reichert [id number]) on 06/19/2021 3:14:54 AM  Radiology DG Chest 2 View  Result Date: 06/19/2021 CLINICAL DATA:  Cough and congestion. EXAM: CHEST - 2 VIEW COMPARISON:  06/07/2021. FINDINGS: The heart is enlarged and the mediastinal contour is stable. Atherosclerotic calcification of the aorta is noted. The pulmonary vasculature is mildly distended. Interstitial prominence is noted in the mid to lower lung fields bilaterally. No effusion or pneumothorax is seen. Sternotomy wires are present over the midline. A right internal jugular central venous catheter is stable. IMPRESSION: 1. Cardiomegaly with mildly distended pulmonary vasculature. 2. Interstitial prominence in the mid to lower lung fields bilaterally, possible edema or infiltrate. Electronically Signed   By: Brett Fairy M.D.   On: 06/19/2021 02:25    Procedures Procedures   CRITICAL CARE Performed by: Quintella Reichert   Total critical care time: 35 minutes  Critical care time was exclusive of separately billable procedures and treating other patients.  Critical care was necessary to treat or prevent imminent or life-threatening deterioration.  Critical care was time spent personally by me on the following activities: development of treatment plan with patient and/or surrogate as well as nursing, discussions with consultants, evaluation of patient's response to treatment, examination of patient, obtaining history from patient or surrogate, ordering and performing treatments and interventions, ordering  and review of laboratory studies, ordering and review of radiographic studies, pulse oximetry and re-evaluation of patient's condition.  Medications Ordered in ED Medications  labetalol (NORMODYNE) injection 20 mg (has no administration in time range)  hydrALAZINE (APRESOLINE) tablet 50 mg (has no administration in time range)  cefTRIAXone (ROCEPHIN) 1 g in sodium chloride 0.9 % 100 mL IVPB (0 g Intravenous Stopped 06/19/21 0521)  doxycycline (VIBRA-TABS) tablet 100 mg (100 mg Oral Given 06/19/21 0404)  cloNIDine (CATAPRES) tablet 0.1 mg (0.1 mg Oral Given 06/19/21 0410)  fentaNYL (SUBLIMAZE) injection 25 mcg (25 mcg Intravenous Given 06/19/21 0410)  labetalol (NORMODYNE) injection 10 mg (10 mg Intravenous Given 06/19/21 0536)    ED Course/ Medical Decision Making/ A&P                           Medical Decision Making Amount and/or Complexity of Data Reviewed Labs: ordered.  Risk Prescription drug management. Decision regarding hospitalization.   Patient with ESRD on dialysis, hypertension here for evaluation of chest pain, cough and difficulty breathing.  She was recently discharged from the hospital about 1 week ago following hospitalization for COVID-19.  She states that she is compliant with her dialysis but has not been compliant with her home medications.  She is significantly hypertensive on ED presentation.  EKG is abnormal but similar when compared to priors.  She does appear mildly volume overloaded on assessment.  Chest x-ray with possible pulmonary vascular congestion versus infiltrate, personally reviewed.  And reported patient's persistent fevers at home will treat with antibiotics for possible pneumonia although suspect volume overload is more likely.  BMP with significant elevation in her BUN, mild elevation in her potassium.  Given stable EKG will not give temporizing measures at this time for her hyperkalemia.  She was previously on a clonidine patch, she removed this a few days  ago and has not replaced it per patient.  Discussed with pharmacist regarding reducing clonidine-we will start with oral clonidine at 0.1 mg.  She was also treated with labetalol, hydralazine  for her accelerated hypertension.  Troponins are elevated but similar when compared to priors.  Current troponin elevation is likely secondary to underlying renal disease as well as her elevated blood pressures.  Given her volume overload and other symptoms recommend admission for ongoing treatment.  Patient is in agreement with plan.  Medicine consulted for admission.  Discussed with Dr. Justin Mend with nephrology regarding need for dialysis later today.        Final Clinical Impression(s) / ED Diagnoses Final diagnoses:  ESRD (end stage renal disease) on dialysis [facility])  Precordial pain    Rx / DC Orders ED Discharge Orders     None         Quintella Reichert, MD 06/19/21 [phone]

## 2021-06-19 NOTE — ED Triage Notes (Signed)
The pt arrived by gems from home  she was  admitted and d/cd this past Friday   she was admitted for covid.one week ago.  Vitals this am about the same asher discharge vitals now.   Dialysis pt  last dialyzed Tuesday.  Dialysis catheter in her rt upper chest

## 2021-06-20 DIAGNOSIS — R0789 Other chest pain: Secondary | ICD-10-CM | POA: Diagnosis not present

## 2021-06-20 DIAGNOSIS — Z8673 Personal history of transient ischemic attack (TIA), and cerebral infarction without residual deficits: Secondary | ICD-10-CM | POA: Diagnosis not present

## 2021-06-20 DIAGNOSIS — J449 Chronic obstructive pulmonary disease, unspecified: Secondary | ICD-10-CM | POA: Diagnosis not present

## 2021-06-20 DIAGNOSIS — N186 End stage renal disease: Secondary | ICD-10-CM

## 2021-06-20 DIAGNOSIS — Z992 Dependence on renal dialysis: Secondary | ICD-10-CM

## 2021-06-20 DIAGNOSIS — Z91199 Patient's noncompliance with other medical treatment and regimen due to unspecified reason: Secondary | ICD-10-CM

## 2021-06-20 DIAGNOSIS — R071 Chest pain on breathing: Secondary | ICD-10-CM | POA: Diagnosis not present

## 2021-06-20 DIAGNOSIS — I10 Essential (primary) hypertension: Secondary | ICD-10-CM

## 2021-06-20 LAB — CBC
HCT: 24.6 % — ABNORMAL LOW (ref 36.0–46.0)
Hemoglobin: 7.8 g/dL — ABNORMAL LOW (ref 12.0–15.0)
MCH: 29.5 pg (ref 26.0–34.0)
MCHC: 31.7 g/dL (ref 30.0–36.0)
MCV: 93.2 fL (ref 80.0–100.0)
Platelets: 237 10*3/uL (ref 150–400)
RBC: 2.64 MIL/uL — ABNORMAL LOW (ref 3.87–5.11)
RDW: 20.1 % — ABNORMAL HIGH (ref 11.5–15.5)
WBC: 3.9 10*3/uL — ABNORMAL LOW (ref 4.0–10.5)
nRBC: 0 % (ref 0.0–0.2)

## 2021-06-20 LAB — BASIC METABOLIC PANEL
Anion gap: 12 (ref 5–15)
BUN: 38 mg/dL — ABNORMAL HIGH (ref 8–23)
CO2: 26 mmol/L (ref 22–32)
Calcium: 8 mg/dL — ABNORMAL LOW (ref 8.9–10.3)
Chloride: 99 mmol/L (ref 98–111)
Creatinine, Ser: 6.65 mg/dL — ABNORMAL HIGH (ref 0.44–1.00)
GFR, Estimated: 6 mL/min — ABNORMAL LOW (ref >60)
Glucose, Bld: 96 mg/dL (ref 70–99)
Potassium: 4.2 mmol/L (ref 3.5–5.1)
Sodium: 137 mmol/L (ref 135–145)

## 2021-06-20 LAB — GLUCOSE, CAPILLARY
Glucose-Capillary: 109 mg/dL — ABNORMAL HIGH (ref 70–99)
Glucose-Capillary: 115 mg/dL — ABNORMAL HIGH (ref 70–99)
Glucose-Capillary: 139 mg/dL — ABNORMAL HIGH (ref 70–99)
Glucose-Capillary: 161 mg/dL — ABNORMAL HIGH (ref 70–99)
Glucose-Capillary: 95 mg/dL (ref 70–99)

## 2021-06-20 LAB — BRAIN NATRIURETIC PEPTIDE: B Natriuretic Peptide: >4500 pg/mL — ABNORMAL HIGH (ref 0.0–100.0)

## 2021-06-20 LAB — TSH: TSH: 0.189 u[IU]/mL — ABNORMAL LOW (ref 0.350–4.500)

## 2021-06-20 MED ORDER — LIDOCAINE HCL (PF) 1 % IJ SOLN: 5.0000 mL | INTRAMUSCULAR | Status: DC | PRN

## 2021-06-20 MED ORDER — SODIUM CHLORIDE 0.9 % IV SOLN: 100.0000 mL | INTRAVENOUS | Status: DC | PRN

## 2021-06-20 MED ORDER — LIDOCAINE-PRILOCAINE 2.5-2.5 % EX CREA: 1.0000 "application " | TOPICAL_CREAM | CUTANEOUS | Status: DC | PRN

## 2021-06-20 MED ORDER — ISOSORB DINITRATE-HYDRALAZINE 20-37.5 MG PO TABS
1.0000 | ORAL_TABLET | Freq: Three times a day (TID) | ORAL | Status: DC
  Administered 2021-06-20 – 2021-06-21 (×2): 1 via ORAL
  Filled 2021-06-20 (×6): qty 1

## 2021-06-20 MED ORDER — PENTAFLUOROPROP-TETRAFLUOROETH EX AERO: 1.0000 "application " | INHALATION_SPRAY | CUTANEOUS | Status: DC | PRN

## 2021-06-20 MED ORDER — ALTEPLASE 2 MG IJ SOLR: 2.0000 mg | Freq: Once | INTRAMUSCULAR | Status: DC | PRN

## 2021-06-20 MED ORDER — DARBEPOETIN ALFA 150 MCG/0.3ML IJ SOSY
150.0000 ug | PREFILLED_SYRINGE | INTRAMUSCULAR | Status: DC
  Administered 2021-06-21: 150 ug via INTRAVENOUS
  Filled 2021-06-20 (×2): qty 0.3

## 2021-06-20 MED ORDER — HEPARIN SODIUM (PORCINE) 1000 UNIT/ML DIALYSIS: 1000.0000 [IU] | INTRAMUSCULAR | Status: DC | PRN

## 2021-06-20 NOTE — Progress Notes (Signed)
I have confirmed with the patient verbally that she follows with Dr. Virgina Jock for cardiology care. I will remove from [facility] consult list.   Courtney Paris 06/20/2021, 3:05 PM [place] [address] [place], [place] [address]

## 2021-06-20 NOTE — Progress Notes (Addendum)
Pt receives out-pt HD at [facility] on TTS. Pt arrives at 11:40 for 12:00 chair time. Contacted attending regarding possible d/c date since pt due HD tomorrow. Pt is for cardiology test tomorrow.  Melven Sartorius Renal Navigator [phone]

## 2021-06-20 NOTE — Progress Notes (Addendum)
PROGRESS NOTE    Monique Henderson  KDX:[id number] DOB: 1951-02-22 DOA: 06/19/2021 PCP: Myrtie Neither, PA-C    Brief Narrative:  Monique Henderson is a 71 y.o. female with medical history significant of recent hospitalization for COVID, end-stage renal disease on hemodialysis, hypertension, medical noncompliance comes in with pleuritic chest pain from coughing.  Patient was just discharged 5 to 6 days ago had her routine dialysis Tuesday she is due for dialysis today.  She denies any fevers.  She has been coughing still a lot.  At the time of discharge she was on room air not requiring any supplemental oxygen.  She went home and has not taken any of her medications because she did not feel like it.  There is been no nausea vomiting diarrhea.  She has had some swelling in her legs which is chronic.  She has also been short of breath.  Patient be referred for admission for chest pain in the setting of needing hemodialysis.   1/20 s/p HD yesterday   Consultants:  nephrology  Procedures:   Antimicrobials:     Subjective: Sob better, no cp  Objective: Vitals:   06/20/21 0748 06/20/21 1130 06/20/21 1550 06/20/21 1952  BP: (!) 168/117 130/80 (!) 153/76 (!) 169/87  Pulse: 70 78 81 86  Resp: 19 19 19 20   Temp: 97.6 F (36.4 C) 98.6 F (37 C) 98.5 F (36.9 C) 98.8 F (37.1 C)  TempSrc:  Oral Oral Oral  SpO2: 100% 97% 94% 100%  Weight:      Height:       No intake or output data in the 24 hours ending 06/20/21 2019 Filed Weights   06/19/21 0140  Weight: 48.2 kg    Examination:  General exam: Appears calm and comfortable  Respiratory system: decrease bs, Respiratory effort normal. Cardiovascular system: S1 & S2 heard, RRR. No gallop Gastrointestinal system: Abdomen is nondistended, soft and nontender. No organo Normal bowel sounds heard. Central nervous system: Alert and oriented. No focal neurological deficits. Extremities: no edema Psychiatry: Mood & affect  appropriate.     Data Reviewed: I have personally reviewed following labs and imaging studies  CBC: Recent Labs  Lab 06/19/21 0155 06/20/21 0219  WBC 6.3 3.9*  NEUTROABS 4.7  --   HGB 8.7* 7.8*  HCT 28.2* 24.6*  MCV 94.9 93.2  PLT 282 976   Basic Metabolic Panel: Recent Labs  Lab 06/19/21 0155 06/20/21 0219  NA 140 137  K 5.6* 4.2  CL 105 99  CO2 20* 26  GLUCOSE 115* 96  BUN 98* 38*  CREATININE 12.90* 6.65*  CALCIUM 8.7* 8.0*   GFR: Estimated Creatinine Clearance: 6.2 mL/min (A) (by C-G formula based on SCr of 6.65 mg/dL (H)). Liver Function Tests: Recent Labs  Lab 06/19/21 0155  AST 17  ALT 12  ALKPHOS 89  BILITOT 0.5  PROT 6.9  ALBUMIN 3.3*   No results for input(s): LIPASE, AMYLASE in the last 168 hours. No results for input(s): AMMONIA in the last 168 hours. Coagulation Profile: No results for input(s): INR, PROTIME in the last 168 hours. Cardiac Enzymes: No results for input(s): CKTOTAL, CKMB, CKMBINDEX, TROPONINI in the last 168 hours. BNP (last 3 results) No results for input(s): PROBNP in the last 8760 hours. HbA1C: No results for input(s): HGBA1C in the last 72 hours. CBG: Recent Labs  Lab 06/20/21 0753 06/20/21 1129 06/20/21 1549 06/20/21 1956  GLUCAP 95 109* 139* 115*   Lipid Profile: No results for  input(s): CHOL, HDL, LDLCALC, TRIG, CHOLHDL, LDLDIRECT in the last 72 hours. Thyroid Function Tests: Recent Labs    06/20/21 1808  TSH 0.189*   Anemia Panel: No results for input(s): VITAMINB12, FOLATE, FERRITIN, TIBC, IRON, RETICCTPCT in the last 72 hours. Sepsis Labs: Recent Labs  Lab 06/19/21 0422  LATICACIDVEN 0.9    Recent Results (from the past 240 hour(s))  Culture, blood (routine x 2)     Status: None (Preliminary result)   Collection Time: 06/19/21  3:45 AM   Specimen: BLOOD RIGHT FOREARM  Result Value Ref Range Status   Specimen Description BLOOD RIGHT FOREARM  Final   Special Requests AEROBIC BOTTLE ONLY Blood  Culture adequate volume  Final   Culture   Final    NO GROWTH 1 DAY Performed at [facility], [place] [address], [place], [place] [address]    Report Status PENDING  Incomplete  Culture, blood (routine x 2)     Status: None (Preliminary result)   Collection Time: 06/19/21  3:55 AM   Specimen: BLOOD  Result Value Ref Range Status   Specimen Description BLOOD BLOOD RIGHT WRIST  Final   Special Requests   Final    BOTTLES DRAWN AEROBIC AND ANAEROBIC Blood Culture adequate volume   Culture   Final    NO GROWTH 1 DAY Performed at [facility], [place] [address], [place], [place] [address]    Report Status PENDING  Incomplete         Radiology Studies: DG Chest 2 View  Result Date: 06/19/2021 CLINICAL DATA:  Cough and congestion. EXAM: CHEST - 2 VIEW COMPARISON:  06/07/2021. FINDINGS: The heart is enlarged and the mediastinal contour is stable. Atherosclerotic calcification of the aorta is noted. The pulmonary vasculature is mildly distended. Interstitial prominence is noted in the mid to lower lung fields bilaterally. No effusion or pneumothorax is seen. Sternotomy wires are present over the midline. A right internal jugular central venous catheter is stable. IMPRESSION: 1. Cardiomegaly with mildly distended pulmonary vasculature. 2. Interstitial prominence in the mid to lower lung fields bilaterally, possible edema or infiltrate. Electronically Signed   By: Brett Fairy M.D.   On: 06/19/2021 02:25   CT Angio Chest Pulmonary Embolism (PE) W or WO Contrast  Result Date: 06/19/2021 CLINICAL DATA:  Chest pain or SOB, pleurisy or effusion suspected EXAM: CT ANGIOGRAPHY CHEST WITH CONTRAST TECHNIQUE: Multidetector CT imaging of the chest was performed using the standard protocol during bolus administration of intravenous contrast. Multiplanar CT image reconstructions and MIPs were obtained to evaluate the vascular anatomy. RADIATION DOSE REDUCTION: This exam was performed according to  the departmental dose-optimization program which includes automated exposure control, adjustment of the mA and/or kV according to patient size and/or use of iterative reconstruction technique. CONTRAST:  26mL OMNIPAQUE IOHEXOL 350 MG/ML SOLN COMPARISON:  06/05/2021 FINDINGS: Cardiovascular: Preferential opacification of the pulmonary arteries with satisfactory opacification to the segmental level. No evidence of pulmonary embolism. Stable cardiomegaly. No pericardial effusion. Mediastinum/Nodes: No enlarged nodes identified. Lungs/Pleura: Persistent small right pleural effusion has decreased since the prior study. Lung aeration has improved with significantly reduced ground-glass opacities. There is bibasilar atelectasis. Upper Abdomen: No acute abnormality. Musculoskeletal: Body wall edema.  No acute osseous abnormality. Review of the MIP images confirms the above findings. IMPRESSION: No evidence of acute pulmonary embolism. Significantly improved lung aeration. Decreased, now small right pleural effusion. Stable cardiomegaly. Electronically Signed   By: Macy Mis M.D.   On: 06/19/2021 09:04  ECHOCARDIOGRAM COMPLETE  Result Date: 06/19/2021    ECHOCARDIOGRAM REPORT   Patient Name:   Monique Henderson Date of Exam: 06/19/2021 Medical Rec #:  [id number]          Height:       63.0 in Accession #:    [id number]         Weight:       106.3 lb Date of Birth:  1951/05/26           BSA:          1.478 m Patient Age:    67 years           BP:           167/101 mmHg Patient Gender: F                  HR:           85 bpm. Exam Location:  Inpatient Procedure: 2D Echo, Cardiac Doppler and Color Doppler Indications:    R07.9* Chest pain, unspecified  History:        Patient has prior history of Echocardiogram examinations, most                 recent 09/22/2020. Arrythmias:Atrial Fibrillation; Risk                 Factors:Current Smoker and Hypertension.  Sonographer:    Bernadene Person RDCS Referring Phys: [place]  1. Apical hypokinesis with overall mild to moderate LV dysfunction; severe LVH.  2. Left ventricular ejection fraction, by estimation, is 40 to 45%. The left ventricle has mild to moderately decreased function. The left ventricle demonstrates regional wall motion abnormalities (see scoring diagram/findings for description). There is  severe concentric left ventricular hypertrophy. Left ventricular diastolic parameters are consistent with Grade III diastolic dysfunction (restrictive). Elevated left atrial pressure.  3. Right ventricular systolic function is normal. The right ventricular size is normal. There is severely elevated pulmonary artery systolic pressure.  4. Left atrial size was moderately dilated.  5. RA catheter.  6. The mitral valve is normal in structure. Mild mitral valve regurgitation. No evidence of mitral stenosis.  7. Tricuspid valve regurgitation is severe.  8. The aortic valve is tricuspid. Aortic valve regurgitation is trivial. No aortic stenosis is present.  9. The inferior vena cava is normal in size with greater than 50% respiratory variability, suggesting right atrial pressure of 3 mmHg. FINDINGS  Left Ventricle: Left ventricular ejection fraction, by estimation, is 40 to 45%. The left ventricle has mild to moderately decreased function. The left ventricle demonstrates regional wall motion abnormalities. The left ventricular internal cavity size was normal in size. There is severe concentric left ventricular hypertrophy. Left ventricular diastolic parameters are consistent with Grade III diastolic dysfunction (restrictive). Elevated left atrial pressure. Right Ventricle: The right ventricular size is normal. Right ventricular systolic function is normal. There is severely elevated pulmonary artery systolic pressure. The tricuspid regurgitant velocity is 4.14 m/s, and with an assumed right atrial pressure  of 3 mmHg, the estimated right ventricular systolic pressure is  61.6 mmHg. Left Atrium: Left atrial size was moderately dilated. Right Atrium: Right atrial size was normal in size. RA catheter. Pericardium: There is no evidence of pericardial effusion. Mitral Valve: The mitral valve is normal in structure. Mild mitral valve regurgitation. No evidence of mitral valve stenosis. Tricuspid Valve: The tricuspid valve is normal in structure. Tricuspid valve regurgitation is severe. No evidence  of tricuspid stenosis. Aortic Valve: The aortic valve is tricuspid. Aortic valve regurgitation is trivial. No aortic stenosis is present. Pulmonic Valve: The pulmonic valve was normal in structure. Pulmonic valve regurgitation is not visualized. No evidence of pulmonic stenosis. Aorta: The aortic root is normal in size and structure. Venous: The inferior vena cava is normal in size with greater than 50% respiratory variability, suggesting right atrial pressure of 3 mmHg. IAS/Shunts: No atrial level shunt detected by color flow Doppler. Additional Comments: Apical hypokinesis with overall mild to moderate LV dysfunction; severe LVH.  LEFT VENTRICLE PLAX 2D LVIDd:         4.00 cm      Diastology LVIDs:         3.40 cm      LV e' medial:    4.95 cm/s LV PW:         1.80 cm      LV E/e' medial:  30.3 LV IVS:        1.10 cm      LV e' lateral:   6.08 cm/s LVOT diam:     1.80 cm      LV E/e' lateral: 24.7 LV SV:         43 LV SV Index:   29 LVOT Area:     2.54 cm  LV Volumes (MOD) LV vol d, MOD A2C: 125.0 ml LV vol d, MOD A4C: 105.0 ml LV vol s, MOD A2C: 73.3 ml LV vol s, MOD A4C: 61.0 ml LV SV MOD A2C:     51.7 ml LV SV MOD A4C:     105.0 ml LV SV MOD BP:      52.1 ml RIGHT VENTRICLE RV S prime:     9.21 cm/s TAPSE (M-mode): 1.6 cm LEFT ATRIUM             Index        RIGHT ATRIUM           Index LA diam:        4.20 cm 2.84 cm/m   RA Area:     17.80 cm LA Vol (A2C):   59.8 ml 40.46 ml/m  RA Volume:   47.40 ml  32.07 ml/m LA Vol (A4C):   58.4 ml 39.52 ml/m LA Biplane Vol: 63.1 ml 42.70 ml/m   AORTIC VALVE LVOT Vmax:   97.00 cm/s LVOT Vmean:  60.600 cm/s LVOT VTI:    0.168 m  AORTA Ao Root diam: 2.60 cm Ao Asc diam:  2.70 cm MITRAL VALVE                  TRICUSPID VALVE MV Area (PHT): 5.38 cm       TR Peak grad:   68.6 mmHg MV Decel Time: 141 msec       TR Vmax:        414.00 cm/s MR Peak grad:    134.1 mmHg MR Mean grad:    100.0 mmHg   SHUNTS MR Vmax:         579.00 cm/s  Systemic VTI:  0.17 m MR Vmean:        486.0 cm/s   Systemic Diam: 1.80 cm MR PISA:         0.25 cm MR PISA Eff ROA: 1 mm MR PISA Radius:  0.20 cm MV E velocity: 150.00 cm/s MV A velocity: 71.70 cm/s MV E/A ratio:  2.09 Kirk Ruths MD Electronically signed by Kirk Ruths MD Signature Date/Time: 06/19/2021/2:22:16  PM    Final         Scheduled Meds:  apixaban  2.5 mg Oral BID   Chlorhexidine Gluconate Cloth  6 each Topical Q0600   [START ON 06/21/2021] cloNIDine  0.1 mg Transdermal Q Sat   [START ON 06/21/2021] darbepoetin (ARANESP) injection - DIALYSIS  150 mcg Intravenous Q Sat-HD   [START ON 06/21/2021] doxercalciferol  3 mcg Intravenous Q T,Th,Sa-HD   isosorbide-hydrALAZINE  1 tablet Oral TID   losartan  100 mg Oral Daily   pregabalin  100 mg Oral QHS   rosuvastatin  10 mg Oral Daily   sodium chloride flush  3 mL Intravenous Q12H   sucroferric oxyhydroxide  1,000 mg Oral TID WC   torsemide  40 mg Oral Daily   Continuous Infusions:  sodium chloride     sodium chloride     sodium chloride      Assessment & Plan:   Principal Problem:   Chest pain Active Problems:   Chronic obstructive lung disease (HCC)   Non compliance with medical treatment   History of CVA (cerebrovascular accident)   ESRD on dialysis ([facility])   Malignant hypertension   Chest pain- CTA neg. For PE Echo with EF 40-45% Cardiology consulted, input appreciated, plan for nuclear stress test in am . Further mx based on stress test findings.     Malignant hypertension- Continue home meds Start Bidil Hx/o non compliance    Chronic obstructive lung disease (HCC)-l No acute exacerbation      ESRD on dialysis ([facility]) HD yesterday, next HD tomorrow Nephrology following     DVT prophylaxis: eliquis Code Status:full Family Communication: none at bedside Disposition Plan: d/c in am pending stress test Status is: Observation  The patient remains OBS appropriate and will d/c before 2 midnights.           LOS: 0 days   Time spent: 45 min with >50% on coc    Nolberto Hanlon, MD [facility] Pager [phone]  If 7PM-7AM, please contact night-coverage 06/20/2021, 8:19 PM

## 2021-06-20 NOTE — Progress Notes (Signed)
[facility] Progress Note   Subjective:    Seen and examined patient at bedside. Tolerated yesterday's HD with net UF 2.3L. No complaints. Denies SOB, CP, and N/V. Plan for HD 06/21/21.   Objective Vitals:   06/19/21 2257 06/20/21 0049 06/20/21 0748 06/20/21 1130  BP: (!) 154/92 (!) 166/91 (!) 168/117 130/80  Pulse:  79 70 78  Resp:  19 19 19   Temp:   97.6 F (36.4 C) 98.6 F (37 C)  TempSrc:    Oral  SpO2:  100% 100% 97%  Weight:      Height:       Physical Exam General: Weak-appearing; NAD Heart: Normal S1 and S2; No murmur, gallops, or rubs Lungs: Clear throughout; No wheezing, rales, or rhonchi Abdomen: Soft and non-tender Extremities: No edema BLLE Dialysis Access: [facility]   Filed Weights   06/19/21 0140  Weight: 48.2 kg    Intake/Output Summary (Last 24 hours) at 06/20/2021 1323 Last data filed at 06/19/2021 1830 Gross per 24 hour  Intake --  Output 2385 ml  Net -2385 ml    Additional Objective Labs: Basic Metabolic Panel: Recent Labs  Lab 06/19/21 0155 06/20/21 0219  NA 140 137  K 5.6* 4.2  CL 105 99  CO2 20* 26  GLUCOSE 115* 96  BUN 98* 38*  CREATININE 12.90* 6.65*  CALCIUM 8.7* 8.0*   Liver Function Tests: Recent Labs  Lab 06/19/21 0155  AST 17  ALT 12  ALKPHOS 89  BILITOT 0.5  PROT 6.9  ALBUMIN 3.3*   No results for input(s): LIPASE, AMYLASE in the last 168 hours. CBC: Recent Labs  Lab 06/19/21 0155 06/20/21 0219  WBC 6.3 3.9*  NEUTROABS 4.7  --   HGB 8.7* 7.8*  HCT 28.2* 24.6*  MCV 94.9 93.2  PLT 282 237   Blood Culture    Component Value Date/Time   SDES BLOOD BLOOD RIGHT WRIST 06/19/2021 0355   SPECREQUEST  06/19/2021 0355    BOTTLES DRAWN AEROBIC AND ANAEROBIC Blood Culture adequate volume   CULT  06/19/2021 0355    NO GROWTH 1 DAY Performed at [facility], [place] [address] [place], [place] [address]    REPTSTATUS PENDING 06/19/2021 0355    Cardiac Enzymes: No results for input(s): CKTOTAL,  CKMB, CKMBINDEX, TROPONINI in the last 168 hours. CBG: Recent Labs  Lab 06/20/21 0753 06/20/21 1129  GLUCAP 95 109*   Iron Studies: No results for input(s): IRON, TIBC, TRANSFERRIN, FERRITIN in the last 72 hours. Lab Results  Component Value Date   INR 1.5 (H) 06/09/2021   INR 1.5 (H) 06/08/2021   INR 1.3 (H) 06/07/2021   Studies/Results: DG Chest 2 View  Result Date: 06/19/2021 CLINICAL DATA:  Cough and congestion. EXAM: CHEST - 2 VIEW COMPARISON:  06/07/2021. FINDINGS: The heart is enlarged and the mediastinal contour is stable. Atherosclerotic calcification of the aorta is noted. The pulmonary vasculature is mildly distended. Interstitial prominence is noted in the mid to lower lung fields bilaterally. No effusion or pneumothorax is seen. Sternotomy wires are present over the midline. A right internal jugular central venous catheter is stable. IMPRESSION: 1. Cardiomegaly with mildly distended pulmonary vasculature. 2. Interstitial prominence in the mid to lower lung fields bilaterally, possible edema or infiltrate. Electronically Signed   By: Brett Fairy M.D.   On: 06/19/2021 02:25   CT Angio Chest Pulmonary Embolism (PE) W or WO Contrast  Result Date: 06/19/2021 CLINICAL DATA:  Chest pain or SOB, pleurisy  or effusion suspected EXAM: CT ANGIOGRAPHY CHEST WITH CONTRAST TECHNIQUE: Multidetector CT imaging of the chest was performed using the standard protocol during bolus administration of intravenous contrast. Multiplanar CT image reconstructions and MIPs were obtained to evaluate the vascular anatomy. RADIATION DOSE REDUCTION: This exam was performed according to the departmental dose-optimization program which includes automated exposure control, adjustment of the mA and/or kV according to patient size and/or use of iterative reconstruction technique. CONTRAST:  40mL OMNIPAQUE IOHEXOL 350 MG/ML SOLN COMPARISON:  06/05/2021 FINDINGS: Cardiovascular: Preferential opacification of the  pulmonary arteries with satisfactory opacification to the segmental level. No evidence of pulmonary embolism. Stable cardiomegaly. No pericardial effusion. Mediastinum/Nodes: No enlarged nodes identified. Lungs/Pleura: Persistent small right pleural effusion has decreased since the prior study. Lung aeration has improved with significantly reduced ground-glass opacities. There is bibasilar atelectasis. Upper Abdomen: No acute abnormality. Musculoskeletal: Body wall edema.  No acute osseous abnormality. Review of the MIP images confirms the above findings. IMPRESSION: No evidence of acute pulmonary embolism. Significantly improved lung aeration. Decreased, now small right pleural effusion. Stable cardiomegaly. Electronically Signed   By: Macy Mis M.D.   On: 06/19/2021 09:04   ECHOCARDIOGRAM COMPLETE  Result Date: 06/19/2021    ECHOCARDIOGRAM REPORT   Patient Name:   Monique Henderson Date of Exam: 06/19/2021 Medical Rec #:  [id number]          Height:       63.0 in Accession #:    [id number]         Weight:       106.3 lb Date of Birth:  1950/08/24           BSA:          1.478 m Patient Age:    71 years           BP:           167/101 mmHg Patient Gender: F                  HR:           85 bpm. Exam Location:  Inpatient Procedure: 2D Echo, Cardiac Doppler and Color Doppler Indications:    R07.9* Chest pain, unspecified  History:        Patient has prior history of Echocardiogram examinations, most                 recent 09/22/2020. Arrythmias:Atrial Fibrillation; Risk                 Factors:Current Smoker and Hypertension.  Sonographer:    Bernadene Person RDCS Referring Phys: [place]  1. Apical hypokinesis with overall mild to moderate LV dysfunction; severe LVH.  2. Left ventricular ejection fraction, by estimation, is 40 to 45%. The left ventricle has mild to moderately decreased function. The left ventricle demonstrates regional wall motion abnormalities (see scoring  diagram/findings for description). There is  severe concentric left ventricular hypertrophy. Left ventricular diastolic parameters are consistent with Grade III diastolic dysfunction (restrictive). Elevated left atrial pressure.  3. Right ventricular systolic function is normal. The right ventricular size is normal. There is severely elevated pulmonary artery systolic pressure.  4. Left atrial size was moderately dilated.  5. RA catheter.  6. The mitral valve is normal in structure. Mild mitral valve regurgitation. No evidence of mitral stenosis.  7. Tricuspid valve regurgitation is severe.  8. The aortic valve is tricuspid. Aortic valve regurgitation is trivial. No aortic stenosis is  present.  9. The inferior vena cava is normal in size with greater than 50% respiratory variability, suggesting right atrial pressure of 3 mmHg. FINDINGS  Left Ventricle: Left ventricular ejection fraction, by estimation, is 40 to 45%. The left ventricle has mild to moderately decreased function. The left ventricle demonstrates regional wall motion abnormalities. The left ventricular internal cavity size was normal in size. There is severe concentric left ventricular hypertrophy. Left ventricular diastolic parameters are consistent with Grade III diastolic dysfunction (restrictive). Elevated left atrial pressure. Right Ventricle: The right ventricular size is normal. Right ventricular systolic function is normal. There is severely elevated pulmonary artery systolic pressure. The tricuspid regurgitant velocity is 4.14 m/s, and with an assumed right atrial pressure  of 3 mmHg, the estimated right ventricular systolic pressure is 79.4 mmHg. Left Atrium: Left atrial size was moderately dilated. Right Atrium: Right atrial size was normal in size. RA catheter. Pericardium: There is no evidence of pericardial effusion. Mitral Valve: The mitral valve is normal in structure. Mild mitral valve regurgitation. No evidence of mitral valve stenosis.  Tricuspid Valve: The tricuspid valve is normal in structure. Tricuspid valve regurgitation is severe. No evidence of tricuspid stenosis. Aortic Valve: The aortic valve is tricuspid. Aortic valve regurgitation is trivial. No aortic stenosis is present. Pulmonic Valve: The pulmonic valve was normal in structure. Pulmonic valve regurgitation is not visualized. No evidence of pulmonic stenosis. Aorta: The aortic root is normal in size and structure. Venous: The inferior vena cava is normal in size with greater than 50% respiratory variability, suggesting right atrial pressure of 3 mmHg. IAS/Shunts: No atrial level shunt detected by color flow Doppler. Additional Comments: Apical hypokinesis with overall mild to moderate LV dysfunction; severe LVH.  LEFT VENTRICLE PLAX 2D LVIDd:         4.00 cm      Diastology LVIDs:         3.40 cm      LV e' medial:    4.95 cm/s LV PW:         1.80 cm      LV E/e' medial:  30.3 LV IVS:        1.10 cm      LV e' lateral:   6.08 cm/s LVOT diam:     1.80 cm      LV E/e' lateral: 24.7 LV SV:         43 LV SV Index:   29 LVOT Area:     2.54 cm  LV Volumes (MOD) LV vol d, MOD A2C: 125.0 ml LV vol d, MOD A4C: 105.0 ml LV vol s, MOD A2C: 73.3 ml LV vol s, MOD A4C: 61.0 ml LV SV MOD A2C:     51.7 ml LV SV MOD A4C:     105.0 ml LV SV MOD BP:      52.1 ml RIGHT VENTRICLE RV S prime:     9.21 cm/s TAPSE (M-mode): 1.6 cm LEFT ATRIUM             Index        RIGHT ATRIUM           Index LA diam:        4.20 cm 2.84 cm/m   RA Area:     17.80 cm LA Vol (A2C):   59.8 ml 40.46 ml/m  RA Volume:   47.40 ml  32.07 ml/m LA Vol (A4C):   58.4 ml 39.52 ml/m LA Biplane Vol: 63.1 ml 42.70 ml/m  AORTIC VALVE LVOT Vmax:   97.00 cm/s LVOT Vmean:  60.600 cm/s LVOT VTI:    0.168 m  AORTA Ao Root diam: 2.60 cm Ao Asc diam:  2.70 cm MITRAL VALVE                  TRICUSPID VALVE MV Area (PHT): 5.38 cm       TR Peak grad:   68.6 mmHg MV Decel Time: 141 msec       TR Vmax:        414.00 cm/s MR Peak grad:    134.1  mmHg MR Mean grad:    100.0 mmHg   SHUNTS MR Vmax:         579.00 cm/s  Systemic VTI:  0.17 m MR Vmean:        486.0 cm/s   Systemic Diam: 1.80 cm MR PISA:         0.25 cm MR PISA Eff ROA: 1 mm MR PISA Radius:  0.20 cm MV E velocity: 150.00 cm/s MV A velocity: 71.70 cm/s MV E/A ratio:  2.09 Kirk Ruths MD Electronically signed by Kirk Ruths MD Signature Date/Time: 06/19/2021/2:22:16 PM    Final     Medications:  sodium chloride     sodium chloride     sodium chloride      amLODipine  10 mg Oral Daily   apixaban  2.5 mg Oral BID   Chlorhexidine Gluconate Cloth  6 each Topical Q0600   [START ON 06/21/2021] cloNIDine  0.1 mg Transdermal Q Sat   darbepoetin (ARANESP) injection - DIALYSIS  150 mcg Intravenous Q Thu-HD   [START ON 06/21/2021] doxercalciferol  3 mcg Intravenous Q T,Th,Sa-HD   hydrALAZINE  50 mg Oral TID   losartan  100 mg Oral Daily   pregabalin  100 mg Oral QHS   rosuvastatin  10 mg Oral Daily   sodium chloride flush  3 mL Intravenous Q12H   sucroferric oxyhydroxide  1,000 mg Oral TID WC   torsemide  40 mg Oral Daily    Dialysis Orders: Unit: AF TTS  Time: 3:45  EDW: 45 kg  Flows: 400/A1.5x Bath: 2K/2Ca  Access: TDC  Heparin: 2000  ESA: Mircera 150  q 2 wks (start 1/19)  Fe: Venofer 50 q wk  VDRA: Hectorol 3 TIW   Assessment/Plan: Dyspnea/chest pain --CTA negative for PE. Spoke to Hospitalist. 2D Echo completed-EF 40-45%, recent troponins in 300s, Cardiology consulted today. Optimize volume with HD.  ESRD -  HD TTS. Continue on schedule. Plan for HD 06/21/21 if patient is still here.  Hypertension/volume  - Previously hypertensive, seems to be improving. Continue home meds. Push UF on HD.  Anemia  - Hgb trending downward-now 7.8. Doesn't appear Aranesp was given yesterday. Will give with HD tomorrow. Metabolic bone disease -  Ca/Phos ok. Continue home binders, hectorol  Nutrition - Renal diet with fluid restriction   Tobie Poet, NP [facility] 06/20/2021,1:23 PM  LOS: 0 days

## 2021-06-20 NOTE — Consult Note (Addendum)
CARDIOLOGY CONSULT NOTE  Patient ID: Monique Henderson MRN: [id number] DOB/AGE: 1950-11-22 71 y.o.  Admit date: 06/19/2021 Attending physician: Nolberto Hanlon, MD Primary Physician:  Myrtie Neither, PA-C Outpatient Cardiologist: Dr. Lanell Persons Inpatient Cardiologist: Rex Kras, DO, [facility]  Reason of consultation: Cardiomyopathy, elevated troponin Referring physician: Nolberto Hanlon, MD  Chief complaint: Cough, weakness   HPI:  Monique Henderson is a 71 y.o. African-American female who presents with a chief complaint of " cough and weakness." Her past medical history and cardiovascular risk factors include: h/o type A aortic dissection s/p repair (04/2018), former smoker,paroxysmal atrial fibrillation, h/o stroke (05/2019), moderate b/l carotid artery stenosis, ESRD on HD TTS, hypertension,h/o breast cancer, non-compliance, Hx of COVID (jan 2023).  Recently discharged on 06/09/2021 after being diagnosed with COVID-19 infection now presents to the hospital again with symptoms of generalized weakness and persistent cough.  On presentation patient also complained of precordial discomfort.  The discomfort is usually brought on after an episode of excessive coughing, has a green/yellow productive sputum, not brought on by effort related activities, does not resolve with rest, usually self-limited.  Patient also chose not to take her discharge medications and also missed hemodialysis on 06/07/2021 contributing to volume overload.  On presentation to the hospital on one of the initial blood pressures were 227/139 mmHg.  She also underwent CTA chest PE protocol which was negative for pulmonary embolism with significant improvement in her aeration and decrease in right pleural effusion with stable cardiomegaly.  Echocardiogram was also performed which notes newly reduced LVEF with regional wall motion abnormalities, with grade 3 diastolic impairment.  Laboratory work positive for elevated high  sensitive troponins but essentially flat not suggestive of ACS.  Cardiology has been consulted during this hospitalization due to cardiomyopathy and elevated troponins.  ALLERGIES: Allergies  Allergen Reactions   Shrimp [Shellfish Allergy] Shortness Of Breath   Bactroban [Mupirocin] Other (See Comments)    "Sores in nose"   Vicodin [Hydrocodone-Acetaminophen] Itching and Nausea And Vomiting    This is patient's home medication   Eliquis [Apixaban] Itching    Spoke with patient, no rash   Lisinopril Cough   Tylenol [Acetaminophen] Itching    PAST MEDICAL HISTORY: Past Medical History:  Diagnosis Date   AF (paroxysmal atrial fibrillation) ([place]) 05/29/2019   on Coumadin   Aortic atherosclerosis ([place]) 07/05/2019   Aortic dissection ([place]) 04/04/2019   s/p repair   Bone spur 2008   Right calcaneal foot spur   Breast cancer ([place]) 2004   Ductal carcinoma in situ of the left breast; S/P left partial mastectomy 02/26/2003; S/P re-excision of cranial and lateral margins11/18/2004.radiation   Cerebral thrombosis with cerebral infarction 05/22/2019   Chronic low back pain 06/22/2016   Chronic obstructive lung disease ([place]) 01/16/2017   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 Autumn Messing; S/P re-excision of superior and inferior margins 10/27/2012.    ESRD on dialysis ([place]) 05/29/2019   Essential hypertension 09/16/2006   GERD 09/16/2006   Hepatitis C    treated and RNA confirmed not detectable 01/2017   Insomnia 03/14/2015   Malnutrition of moderate degree 05/19/2019   Non compliance with medical treatment 12/04/2017   Normocytic anemia    With thrombocytosis   Osteoarthritis    Right ureteral stone 2002   S/P lumbar spinal fusion 01/18/2014   S/P lumbar decompressive laminectomy, fusion, and plating for lumbar spinal stenosis on 05/27/2009 by Dr. Eustace Moore.  S/P anterolateral retroperitoneal interbody fusion L2-3 utilizing  a 8 mm peek interbody cage packed with morcellized allograft, and anterior lumbar plating L2-3 for recurrent disc herniation L2-3 with spinal stenosis on 01/18/2014 by Dr. Eustace Moore.     Tobacco use disorder 04/19/2009   Uterine fibroid    Wears dentures    top    PAST SURGICAL HISTORY: Past Surgical History:  Procedure Laterality Date   ANTERIOR LAT LUMBAR FUSION N/A 01/18/2014   Procedure: ANTERIOR LATERAL LUMBAR FUSION LUMBAR TWO-THREE;  Surgeon: Eustace Moore, MD;  Location: [place] [facility];  Service: Neurosurgery;  Laterality: N/A;   ANTERIOR LUMBAR FUSION  01/18/2014   AV FISTULA PLACEMENT Left 04/20/2019   Procedure: ARTERIOVENOUS (AV) FISTULA CREATION;  Surgeon: [name], MD;  Location: [place];  Service: Vascular;  Laterality: Left;   BACK SURGERY     BREAST LUMPECTOMY Left 01/2003   BREAST LUMPECTOMY Right 2014   BREAST LUMPECTOMY WITH NEEDLE LOCALIZATION AND AXILLARY SENTINEL LYMPH NODE BX Right 10/13/2012   Procedure: BREAST LUMPECTOMY WITH NEEDLE LOCALIZATION;  Surgeon: [name], MD;  Location: [place];  Service: General;  Laterality: Right;  Right breast wire localized lumpectomy   INSERTION OF DIALYSIS CATHETER Right 04/20/2019   Procedure: INSERTION OF DIALYSIS CATHETER, right internal jugular;  Surgeon: [name], MD;  Location: [place];  Service: Vascular;  Laterality: Right;   INSERTION OF DIALYSIS CATHETER Right 09/24/2020   Procedure: INSERTION OF TUNNELED DIALYSIS CATHETER;  Surgeon: Rosetta Posner, MD;  Location: [place];  Service: Vascular;  Laterality: Right;   IR FLUORO GUIDE CV LINE RIGHT  09/22/2020   IR THORACENTESIS ASP PLEURAL SPACE W/IMG GUIDE  05/19/2019   IR US GUIDE VASC ACCESS LEFT  09/22/2020   IR US GUIDE VASC ACCESS RIGHT  09/22/2020   IR VENOCAVAGRAM SVC  09/22/2020   LAMINECTOMY  05/27/2009   Lumbar decompressive laminectomy, fusion and plating for lumbar spinal stensosis   LIGATION OF ARTERIOVENOUS   FISTULA Left 09/24/2020   Procedure: LIGATION OF LEFT ARM ARTERIOVENOUS  FISTULA;  Surgeon: Rosetta Posner, MD;  Location: [place];  Service: Vascular;  Laterality: Left;   LUMBAR LAMINECTOMY/DECOMPRESSION MICRODISCECTOMY Left 03/23/2013   Procedure: LUMBAR LAMINECTOMY/DECOMPRESSION MICRODISCECTOMY 1 LEVEL;  Surgeon: Eustace Moore, MD;  Location: [facility];  Service: Neurosurgery;  Laterality: Left;  LUMBAR LAMINECTOMY/DECOMPRESSION MICRODISCECTOMY 1 LEVEL   MASTECTOMY, PARTIAL Left 02/26/2003   ; S/P re-excision of cranial and lateral margins 04/19/2003.    RE-EXCISION OF BREAST CANCER,SUPERIOR MARGINS Right 10/27/2012   Procedure: RE-EXCISION OF BREAST CANCER,SUPERIOR and inferior MARGINS;  Surgeon: [name], MD;  Location: [place];  Service: General;  Laterality: Right;   RE-EXCISION OF BREAST LUMPECTOMY Left 04/2003   TEE WITHOUT CARDIOVERSION N/A 04/04/2019   Procedure: Transesophageal Echocardiogram (Tee);  Surgeon: Wonda Olds, MD;  Location: [place];  Service: Open Heart Surgery;  Laterality: N/A;   THORACIC AORTIC ANEURYSM REPAIR N/A 04/04/2019   Procedure: THORACIC ASCENDING ANEURYSM REPAIR (AAA)  USING 28 MM X 30 CM HEMASHIELD PLATINUM VASCULAR GRAFT;  Surgeon: Wonda Olds, MD;  Location: [facility];  Service: Open Heart Surgery;  Laterality: N/A;    FAMILY HISTORY: The patient's family history includes Bone cancer (age of onset: 56) in her sister; Colon cancer (age of onset: 1) in her mother; Diabetes in her brother, brother, and son; Diabetes (age of onset: 13) in her sister; Hypertension in her brother, brother,  mother, sister, and son; Kidney disease in her son; Multiple sclerosis in her son.   SOCIAL HISTORY:  The patient  reports that she quit smoking about 2 years ago. Her smoking use included cigarettes. She has a 11.00 pack-year smoking history. She has never used smokeless tobacco. She reports that she does not currently use alcohol after a past usage of about 2.0  standard drinks per week. She reports that she does not currently use drugs after having used the following drugs: Cocaine.  MEDICATIONS: Current Outpatient Medications  Medication Instructions   albuterol (VENTOLIN HFA) 108 (90 Base) MCG/ACT inhaler 2 puffs, Inhalation, Every 6 hours PRN   amLODipine (NORVASC) 10 mg, Oral, Daily   cloNIDine (CATAPRES - DOSED IN MG/24 HR) 0.1 mg, Transdermal, Every Sat   cyclobenzaprine (FLEXERIL) 5 mg, Oral, 2 times daily PRN   Eliquis 2.5 mg, Oral, 2 times daily   hydrALAZINE (APRESOLINE) 50 mg, Oral, 3 times daily   loperamide (IMODIUM) 2 mg, Oral, As needed   losartan (COZAAR) 100 mg, Oral, Daily   mirtazapine (REMERON) 7.5 mg, Oral, Daily at bedtime   NARCAN 4 MG/0.1ML LIQD nasal spray kit 1 spray, Nasal, As needed, If no response within 3 minutes then repeat once in the other nostril.  Call 911.   ondansetron (ZOFRAN) 4 mg, Oral, Every 8 hours PRN   pregabalin (LYRICA) 100 mg, Oral, Daily at bedtime   rosuvastatin (CRESTOR) 10 mg, Oral, Daily   sevelamer carbonate (RENVELA) 2.4 g, Oral, 3 times daily with meals   torsemide (DEMADEX) 40 mg, Oral, Daily   Velphoro 1,000 mg, Oral, 3 times daily   VITAMIN D PO 1 tablet, Oral, Daily    REVIEW OF SYSTEMS: Review of Systems  Constitutional: Negative for chills and fever.  HENT:  Negative for hoarse voice and nosebleeds.   Eyes:  Negative for discharge, double vision and pain.  Cardiovascular:  Positive for chest pain (with coughing episode). Negative for claudication, dyspnea on exertion, leg swelling, near-syncope, orthopnea, palpitations, paroxysmal nocturnal dyspnea and syncope.  Respiratory:  Positive for cough, shortness of breath and sputum production (green and yellow). Negative for hemoptysis.   Musculoskeletal:  Negative for muscle cramps and myalgias.  Gastrointestinal:  Negative for abdominal pain, constipation, diarrhea, hematemesis, hematochezia, melena, nausea and vomiting.   Neurological:  Negative for dizziness and light-headedness.  All other systems reviewed and are negative.  PHYSICAL EXAM: Vitals with BMI 06/20/2021 06/20/2021 06/20/2021  Height - - -  Weight - - -  BMI - - -  Systolic 353 614 431  Diastolic 76 80 540  Pulse 81 78 70     Intake/Output Summary (Last 24 hours) at 06/20/2021 1735 Last data filed at 06/19/2021 1830 Gross per 24 hour  Intake --  Output 2385 ml  Net -2385 ml    Net IO Since Admission: -2,385 mL [06/20/21 1735]  CONSTITUTIONAL: Appears older than stated age, hemodynamically stable, No acute distress.  SKIN: Skin is warm and dry. No rash noted. No cyanosis. No pallor. No jaundice HEAD: Normocephalic and atraumatic.  EYES: No scleral icterus MOUTH/THROAT: Moist oral membranes.  NECK: No JVD present. No thyromegaly noted. No carotid bruits  LYMPHATIC: No visible cervical adenopathy.  CHEST Normal respiratory effort. No intercostal retractions, dialysis catheter on the right anterior chest wall LUNGS: Clear to auscultation bilaterally.  No stridor. No wheezes. No rales.  CARDIOVASCULAR: Regular rate and rhythm, positive S1-S2, 3 out of 6 systolic ejection murmur heard over the left lower sternal  border, rubs or gallops appreciated ABDOMINAL: Nonobese, soft, nontender, nondistended, positive bowel sounds in all 4 quadrants, no apparent ascites.  EXTREMITIES: No pitting edema, warm to touch.  HEMATOLOGIC: No significant bruising NEUROLOGIC: Oriented to person, place, and time. Nonfocal. Normal muscle tone.  PSYCHIATRIC: Normal mood and affect. Normal behavior. Cooperative  RADIOLOGY: DG Chest 2 View  Result Date: 06/19/2021 CLINICAL DATA:  Cough and congestion. EXAM: CHEST - 2 VIEW COMPARISON:  06/07/2021. FINDINGS: The heart is enlarged and the mediastinal contour is stable. Atherosclerotic calcification of the aorta is noted. The pulmonary vasculature is mildly distended. Interstitial prominence is noted in the mid to  lower lung fields bilaterally. No effusion or pneumothorax is seen. Sternotomy wires are present over the midline. A right internal jugular central venous catheter is stable. IMPRESSION: 1. Cardiomegaly with mildly distended pulmonary vasculature. 2. Interstitial prominence in the mid to lower lung fields bilaterally, possible edema or infiltrate. Electronically Signed   By: Brett Fairy M.D.   On: 06/19/2021 02:25   CT Angio Chest Pulmonary Embolism (PE) W or WO Contrast  Result Date: 06/19/2021 CLINICAL DATA:  Chest pain or SOB, pleurisy or effusion suspected EXAM: CT ANGIOGRAPHY CHEST WITH CONTRAST TECHNIQUE: Multidetector CT imaging of the chest was performed using the standard protocol during bolus administration of intravenous contrast. Multiplanar CT image reconstructions and MIPs were obtained to evaluate the vascular anatomy. RADIATION DOSE REDUCTION: This exam was performed according to the departmental dose-optimization program which includes automated exposure control, adjustment of the mA and/or kV according to patient size and/or use of iterative reconstruction technique. CONTRAST:  24m OMNIPAQUE IOHEXOL 350 MG/ML SOLN COMPARISON:  06/05/2021 FINDINGS: Cardiovascular: Preferential opacification of the pulmonary arteries with satisfactory opacification to the segmental level. No evidence of pulmonary embolism. Stable cardiomegaly. No pericardial effusion. Mediastinum/Nodes: No enlarged nodes identified. Lungs/Pleura: Persistent small right pleural effusion has decreased since the prior study. Lung aeration has improved with significantly reduced ground-glass opacities. There is bibasilar atelectasis. Upper Abdomen: No acute abnormality. Musculoskeletal: Body wall edema.  No acute osseous abnormality. Review of the MIP images confirms the above findings. IMPRESSION: No evidence of acute pulmonary embolism. Significantly improved lung aeration. Decreased, now small right pleural effusion. Stable  cardiomegaly. Electronically Signed   By: PMacy MisM.D.   On: 06/19/2021 09:04   ECHOCARDIOGRAM COMPLETE  Result Date: 06/19/2021    ECHOCARDIOGRAM REPORT   Patient Name:   Monique PATESDate of Exam: 06/19/2021 Medical Rec #:  [id number]         Height:       63.0 in Accession #:    [id number]        Weight:       106.3 lb Date of Birth:  [birth year]          BSA:          1.478 m Patient Age:    89years           BP:           167/101 mmHg Patient Gender: F                  HR:           85 bpm. Exam Location:  Inpatient Procedure: 2D Echo, Cardiac Doppler and Color Doppler Indications:    R07.9* Chest pain, unspecified  History:        Patient has prior history of Echocardiogram examinations, most  recent 09/22/2020. Arrythmias:Atrial Fibrillation; Risk                 Factors:Current Smoker and Hypertension.  Sonographer:    Bernadene Person RDCS Referring Phys: [place]  1. Apical hypokinesis with overall mild to moderate LV dysfunction; severe LVH.  2. Left ventricular ejection fraction, by estimation, is 40 to 45%. The left ventricle has mild to moderately decreased function. The left ventricle demonstrates regional wall motion abnormalities (see scoring diagram/findings for description). There is  severe concentric left ventricular hypertrophy. Left ventricular diastolic parameters are consistent with Grade III diastolic dysfunction (restrictive). Elevated left atrial pressure.  3. Right ventricular systolic function is normal. The right ventricular size is normal. There is severely elevated pulmonary artery systolic pressure.  4. Left atrial size was moderately dilated.  5. RA catheter.  6. The mitral valve is normal in structure. Mild mitral valve regurgitation. No evidence of mitral stenosis.  7. Tricuspid valve regurgitation is severe.  8. The aortic valve is tricuspid. Aortic valve regurgitation is trivial. No aortic stenosis is present.  9. The inferior  vena cava is normal in size with greater than 50% respiratory variability, suggesting right atrial pressure of 3 mmHg. FINDINGS  Left Ventricle: Left ventricular ejection fraction, by estimation, is 40 to 45%. The left ventricle has mild to moderately decreased function. The left ventricle demonstrates regional wall motion abnormalities. The left ventricular internal cavity size was normal in size. There is severe concentric left ventricular hypertrophy. Left ventricular diastolic parameters are consistent with Grade III diastolic dysfunction (restrictive). Elevated left atrial pressure. Right Ventricle: The right ventricular size is normal. Right ventricular systolic function is normal. There is severely elevated pulmonary artery systolic pressure. The tricuspid regurgitant velocity is 4.14 m/s, and with an assumed right atrial pressure  of 3 mmHg, the estimated right ventricular systolic pressure is 28.7 mmHg. Left Atrium: Left atrial size was moderately dilated. Right Atrium: Right atrial size was normal in size. RA catheter. Pericardium: There is no evidence of pericardial effusion. Mitral Valve: The mitral valve is normal in structure. Mild mitral valve regurgitation. No evidence of mitral valve stenosis. Tricuspid Valve: The tricuspid valve is normal in structure. Tricuspid valve regurgitation is severe. No evidence of tricuspid stenosis. Aortic Valve: The aortic valve is tricuspid. Aortic valve regurgitation is trivial. No aortic stenosis is present. Pulmonic Valve: The pulmonic valve was normal in structure. Pulmonic valve regurgitation is not visualized. No evidence of pulmonic stenosis. Aorta: The aortic root is normal in size and structure. Venous: The inferior vena cava is normal in size with greater than 50% respiratory variability, suggesting right atrial pressure of 3 mmHg. IAS/Shunts: No atrial level shunt detected by color flow Doppler. Additional Comments: Apical hypokinesis with overall mild to  moderate LV dysfunction; severe LVH.  LEFT VENTRICLE PLAX 2D LVIDd:         4.00 cm      Diastology LVIDs:         3.40 cm      LV e' medial:    4.95 cm/s LV PW:         1.80 cm      LV E/e' medial:  30.3 LV IVS:        1.10 cm      LV e' lateral:   6.08 cm/s LVOT diam:     1.80 cm      LV E/e' lateral: 24.7 LV SV:  43 LV SV Index:   29 LVOT Area:     2.54 cm  LV Volumes (MOD) LV vol d, MOD A2C: 125.0 ml LV vol d, MOD A4C: 105.0 ml LV vol s, MOD A2C: 73.3 ml LV vol s, MOD A4C: 61.0 ml LV SV MOD A2C:     51.7 ml LV SV MOD A4C:     105.0 ml LV SV MOD BP:      52.1 ml RIGHT VENTRICLE RV S prime:     9.21 cm/s TAPSE (M-mode): 1.6 cm LEFT ATRIUM             Index        RIGHT ATRIUM           Index LA diam:        4.20 cm 2.84 cm/m   RA Area:     17.80 cm LA Vol (A2C):   59.8 ml 40.46 ml/m  RA Volume:   47.40 ml  32.07 ml/m LA Vol (A4C):   58.4 ml 39.52 ml/m LA Biplane Vol: 63.1 ml 42.70 ml/m  AORTIC VALVE LVOT Vmax:   97.00 cm/s LVOT Vmean:  60.600 cm/s LVOT VTI:    0.168 m  AORTA Ao Root diam: 2.60 cm Ao Asc diam:  2.70 cm MITRAL VALVE                  TRICUSPID VALVE MV Area (PHT): 5.38 cm       TR Peak grad:   68.6 mmHg MV Decel Time: 141 msec       TR Vmax:        414.00 cm/s MR Peak grad:    134.1 mmHg MR Mean grad:    100.0 mmHg   SHUNTS MR Vmax:         579.00 cm/s  Systemic VTI:  0.17 m MR Vmean:        486.0 cm/s   Systemic Diam: 1.80 cm MR PISA:         0.25 cm MR PISA Eff ROA: 1 mm MR PISA Radius:  0.20 cm MV E velocity: 150.00 cm/s MV A velocity: 71.70 cm/s MV E/A ratio:  2.09 Kirk Ruths MD Electronically signed by Kirk Ruths MD Signature Date/Time: 06/19/2021/2:22:16 PM    Final     LABORATORY DATA: Lab Results  Component Value Date   WBC 3.9 (L) 06/20/2021   HGB 7.8 (L) 06/20/2021   HCT 24.6 (L) 06/20/2021   MCV 93.2 06/20/2021   PLT 237 06/20/2021    Recent Labs  Lab 06/19/21 0155 06/20/21 0219  NA 140 137  K 5.6* 4.2  CL 105 99  CO2 20* 26  BUN 98* 38*   CREATININE 12.90* 6.65*  CALCIUM 8.7* 8.0*  PROT 6.9  --   BILITOT 0.5  --   ALKPHOS 89  --   ALT 12  --   AST 17  --   GLUCOSE 115* 96    Lipid Panel  Lab Results  Component Value Date   CHOL 130 02/14/2021   HDL 52.00 02/14/2021   LDLCALC 67 02/14/2021   TRIG 54.0 02/14/2021   CHOLHDL 2 02/14/2021    BNP (last 3 results) Recent Labs    09/21/20 2300 09/23/20 0400  BNP 3,659.7* 2,218.0*    HEMOGLOBIN A1C Lab Results  Component Value Date   HGBA1C 5.9 02/14/2021   MPG 125.5 04/04/2019    Cardiac Panel (last 3 results) Recent Labs    06/19/21 0155 06/19/21 0430  TROPONINIHS 354* 310*     TSH No results for input(s): TSH in the last 8760 hours.   CARDIAC DATABASE: EKG: 06/19/2021: NSR, 98 bpm, biatrial enlargement, without underlying injury pattern.  Echocardiogram: 05/12/2019: LVEF 65 to 70%.  Grade 2 diastolic dysfunction.  Please see formal report for additional details.  06/28/2019: LVEF 57%, moderate LVH, grade 2 diastolic dysfunction.  Please see formal report for additional details.  09/22/2020: LVEF 55-60%, apical hypokinesis, grade 2 diastolic dysfunction, please see formal report for additional details.  06/19/2021: LVEF 40-45%, severe LVH, grade 3 diastolic impairment, elevated LAP, moderately dilated left atrium, cardiac catheter, mild MR, severe TR.  Stress Testing:  Lexiscan (Walking with mod Bruce) Sestamibi Stress Test 06/28/2019: Nondiagnostic ECG stress. Normal myocardial perfusion. Stress LV EF: 61%.  No previous exam available for comparison. Low risk study.   Heart Catheterization: None  IMPRESSION & RECOMMENDATIONS: Monique Henderson is a 71 y.o. African-American female whose past medical history and cardiovascular risk factors include: h/o type A aortic dissection s/p repair (04/2018), former smoker,paroxysmal atrial fibrillation, h/o stroke (05/2019), moderate b/l carotid artery stenosis, ESRD on HD TTS, hypertension,h/o  breast cancer, non-compliance, Hx of COVID (jan 2023).  Impression:  Cardiomyopathy, unspecified.  Precordial pain-noncardiac with multiple CAD risk factors including dialysis, history of type aortic dissection repair, history of PAF, history of stroke  Shortness of breath -present on admission likely due to hypertensive emergency, missed hemodialysis, volume overload.  Paroxysmal atrial fibrillation.  History of CVA.  COVID-19 infection (January 2023)  Hypertension with end-stage renal disease on hemodialysis TTHS  COPD.  Noncompliance (medication and missed HD sessions)  Plan:  Cardiology during this hospitalization has been consulted for elevated troponins and recently discovered cardiomyopathy.  With regards to her chest pain it appears to be noncardiac and more associated with her coughing spells status post COVID infection.  EKG shows normal sinus rhythm without underlying injury pattern.  Echocardiogram during this hospitalization showed notes mildly reduced LVEF, grade 3 diastolic impairment, regional wall motion abnormalities.  Telemetry personally reviewed notes predominantly normal sinus rhythm with episodes of ventricular run.  Other high risk cardiovascular risk factors include end-stage renal disease on hemodialysis, history of stroke, history of aortic dissection status postrepair.  Her pretest probability of having CAD is at least intermediate.  High sensitive troponins are elevated with no significant delta change to suggest ACS.  Currently she is chest pain-free, blood pressure has improved significantly since hospitalization, and since initial precordial discomfort was noncardiac did recommend outpatient follow-up and nuclear stress test. However, patient states that she would feel more comfortable if this is done prior to discharge and given the fact that she has illustrated multiple instances of noncompliance we will proceed with noninvasive cardiovascular  testing  Discussed the risks, benefits, and alternatives to pharmacological stress test.  Given the recent COVID-19 infection would avoid exercise and exposure to other healthcare providers.  Given her cardiomyopathy recommended discontinuation of amlodipine and hydralazine.  Transition to BiDil.  Will check urine drug screen as her prior screens have been positive, BNP, fasting lipid profile, A1c and TSH.  Volume management per nephrology as she does not make much urine.  Continue the current dose of torsemide.  Educated on the importance of improving her modifiable cardiovascular risk factors, blood pressure management, compliance with both medical therapy, outpatient follow-up, and hemodialysis sessions.  Continue the management of her other chronic comorbid conditions per primary and other subspecialties recommendation.  The plan of care was discussed with attending  physician.  Total encounter time 87 minutes. *Total Encounter Time as defined by the Centers for Medicare and Medicaid Services includes, in addition to the face-to-face time of a patient visit (documented in the note above) non-face-to-face time: obtaining and reviewing outside history, ordering and reviewing medications, tests or procedures, care coordination (communications with other health care professionals or caregivers) and documentation in the medical record.  Patient's questions and concerns were addressed to her satisfaction. She voices understanding of the instructions provided during this encounter.   This note was created using a voice recognition software as a result there may be grammatical errors inadvertently enclosed that do not reflect the nature of this encounter. Every attempt is made to correct such errors.  Mechele Claude [facility]  Pager: [phone] Office: [phone] 06/20/2021, 5:35 PM

## 2021-06-20 NOTE — Care Management Obs Status (Signed)
MEDICARE OBSERVATION STATUS NOTIFICATION   Patient Details  Name: Monique Henderson MRN: [id number] Date of Birth: 1950-09-02   Medicare Observation Status Notification Given:  Yes    Joanne Chars, [place] 06/20/2021, 8:43 AM

## 2021-06-21 ENCOUNTER — Observation Stay (HOSPITAL_COMMUNITY): Payer: Medicare Other

## 2021-06-21 DIAGNOSIS — J449 Chronic obstructive pulmonary disease, unspecified: Secondary | ICD-10-CM | POA: Diagnosis not present

## 2021-06-21 DIAGNOSIS — N186 End stage renal disease: Secondary | ICD-10-CM | POA: Diagnosis not present

## 2021-06-21 DIAGNOSIS — R0789 Other chest pain: Secondary | ICD-10-CM | POA: Diagnosis not present

## 2021-06-21 DIAGNOSIS — R072 Precordial pain: Secondary | ICD-10-CM | POA: Diagnosis not present

## 2021-06-21 DIAGNOSIS — Z8673 Personal history of transient ischemic attack (TIA), and cerebral infarction without residual deficits: Secondary | ICD-10-CM | POA: Diagnosis not present

## 2021-06-21 LAB — RENAL FUNCTION PANEL
Albumin: 2.7 g/dL — ABNORMAL LOW (ref 3.5–5.0)
Anion gap: 12 (ref 5–15)
BUN: 54 mg/dL — ABNORMAL HIGH (ref 8–23)
CO2: 27 mmol/L (ref 22–32)
Calcium: 8.3 mg/dL — ABNORMAL LOW (ref 8.9–10.3)
Chloride: 100 mmol/L (ref 98–111)
Creatinine, Ser: 9.52 mg/dL — ABNORMAL HIGH (ref 0.44–1.00)
GFR, Estimated: 4 mL/min — ABNORMAL LOW (ref >60)
Glucose, Bld: 148 mg/dL — ABNORMAL HIGH (ref 70–99)
Phosphorus: 5.9 mg/dL — ABNORMAL HIGH (ref 2.5–4.6)
Potassium: 4.4 mmol/L (ref 3.5–5.1)
Sodium: 139 mmol/L (ref 135–145)

## 2021-06-21 LAB — HEMOGLOBIN A1C
Hgb A1c MFr Bld: 5.1 % (ref 4.8–5.6)
Mean Plasma Glucose: 100 mg/dL

## 2021-06-21 LAB — CBC
HCT: 24.6 % — ABNORMAL LOW (ref 36.0–46.0)
Hemoglobin: 7.5 g/dL — ABNORMAL LOW (ref 12.0–15.0)
MCH: 29 pg (ref 26.0–34.0)
MCHC: 30.5 g/dL (ref 30.0–36.0)
MCV: 95 fL (ref 80.0–100.0)
Platelets: 254 10*3/uL (ref 150–400)
RBC: 2.59 MIL/uL — ABNORMAL LOW (ref 3.87–5.11)
RDW: 19.8 % — ABNORMAL HIGH (ref 11.5–15.5)
WBC: 5.6 10*3/uL (ref 4.0–10.5)
nRBC: 0 % (ref 0.0–0.2)

## 2021-06-21 LAB — GLUCOSE, CAPILLARY: Glucose-Capillary: 96 mg/dL (ref 70–99)

## 2021-06-21 LAB — LIPID PANEL
Cholesterol: 86 mg/dL (ref 0–200)
HDL: 42 mg/dL (ref >40)
LDL Cholesterol: 40 mg/dL (ref 0–99)
Total CHOL/HDL Ratio: 2 RATIO
Triglycerides: 21 mg/dL (ref <150)
VLDL: 4 mg/dL (ref 0–40)

## 2021-06-21 MED ORDER — ISOSORBIDE DINITRATE 40 MG PO TABS: 40.0000 mg | ORAL_TABLET | Freq: Three times a day (TID) | ORAL | 0 refills | Status: DC

## 2021-06-21 MED ORDER — ACETAMINOPHEN 325 MG PO TABS: 650.0000 mg | ORAL_TABLET | Freq: Four times a day (QID) | ORAL | Status: DC | PRN

## 2021-06-21 MED ORDER — SODIUM CHLORIDE 0.9 % IV SOLN: 100.0000 mL | INTRAVENOUS | Status: DC | PRN

## 2021-06-21 MED ORDER — TECHNETIUM TC 99M TETROFOSMIN IV KIT
33.0000 | PACK | Freq: Once | INTRAVENOUS | Status: AC | PRN
  Administered 2021-06-21: 33 via INTRAVENOUS

## 2021-06-21 MED ORDER — REGADENOSON 0.4 MG/5ML IV SOLN
0.4000 mg | Freq: Once | INTRAVENOUS | Status: DC
  Filled 2021-06-21 (×2): qty 5

## 2021-06-21 MED ORDER — TECHNETIUM TC 99M TETROFOSMIN IV KIT
11.0000 | PACK | Freq: Once | INTRAVENOUS | Status: AC | PRN
  Administered 2021-06-21: 11 via INTRAVENOUS

## 2021-06-21 MED ORDER — HYDRALAZINE HCL 20 MG/ML IJ SOLN
10.0000 mg | INTRAMUSCULAR | Status: DC | PRN
  Administered 2021-06-21: 10 mg via INTRAVENOUS
  Filled 2021-06-21: qty 1

## 2021-06-21 MED ORDER — REGADENOSON 0.4 MG/5ML IV SOLN
INTRAVENOUS | Status: AC
  Filled 2021-06-21: qty 5

## 2021-06-21 MED ORDER — ACETAMINOPHEN 500 MG PO TABS
500.0000 mg | ORAL_TABLET | Freq: Once | ORAL | Status: AC
  Administered 2021-06-21: 1000 mg via ORAL
  Filled 2021-06-21: qty 2

## 2021-06-21 MED ORDER — HYDRALAZINE HCL 50 MG PO TABS: 50.0000 mg | ORAL_TABLET | Freq: Three times a day (TID) | ORAL | 0 refills | Status: DC

## 2021-06-21 NOTE — Progress Notes (Signed)
[facility] Progress Note   Subjective:    Seen and examined patient at bedside. No complaints. Asking for her meal and when going home. Patient off isolation for COVID-19. Plan for discharge after HD today.  Objective Vitals:   06/21/21 1501 06/21/21 1508 06/21/21 1530 06/21/21 1601  BP: (!) 190/93 (!) 179/88 (!) 175/96 (!) 184/95  Pulse: 85 85 85 83  Resp: (!) 21  (!) 22 19  Temp: 98 F (36.7 C)     TempSrc: Oral     SpO2: 100%     Weight: 47.9 kg     Height:       Physical Exam General: Weak-appearing; NAD Heart: Normal S1 and S2; No murmur, gallops, or rubs Lungs: Clear throughout; No wheezing, rales, or rhonchi Abdomen: Soft and non-tender Extremities: No edema BLLE Dialysis Access: R [facility]   Filed Weights   06/19/21 0140 06/21/21 1501  Weight: 48.2 kg 47.9 kg    Intake/Output Summary (Last 24 hours) at 06/21/2021 1615 Last data filed at 06/21/2021 0900 Gross per 24 hour  Intake 0 ml  Output --  Net 0 ml    Additional Objective Labs: Basic Metabolic Panel: Recent Labs  Lab 06/19/21 0155 06/20/21 0219 06/21/21 1510  NA 140 137 139  K 5.6* 4.2 4.4  CL 105 99 100  CO2 20* 26 27  GLUCOSE 115* 96 148*  BUN 98* 38* 54*  CREATININE 12.90* 6.65* 9.52*  CALCIUM 8.7* 8.0* 8.3*  PHOS  --   --  5.9*   Liver Function Tests: Recent Labs  Lab 06/19/21 0155 06/21/21 1510  AST 17  --   ALT 12  --   ALKPHOS 89  --   BILITOT 0.5  --   PROT 6.9  --   ALBUMIN 3.3* 2.7*   No results for input(s): LIPASE, AMYLASE in the last 168 hours. CBC: Recent Labs  Lab 06/19/21 0155 06/20/21 0219 06/21/21 1510  WBC 6.3 3.9* 5.6  NEUTROABS 4.7  --   --   HGB 8.7* 7.8* 7.5*  HCT 28.2* 24.6* 24.6*  MCV 94.9 93.2 95.0  PLT 282 237 254   Blood Culture    Component Value Date/Time   SDES BLOOD BLOOD RIGHT WRIST 06/19/2021 0355   SPECREQUEST  06/19/2021 0355    BOTTLES DRAWN AEROBIC AND ANAEROBIC Blood Culture adequate volume   CULT  06/19/2021 0355     NO GROWTH 2 DAYS Performed at [facility], [place] [address], [place], [place] [address]    REPTSTATUS PENDING 06/19/2021 0355    Cardiac Enzymes: No results for input(s): CKTOTAL, CKMB, CKMBINDEX, TROPONINI in the last 168 hours. CBG: Recent Labs  Lab 06/20/21 1129 06/20/21 1549 06/20/21 1956 06/20/21 2340 06/21/21 0324  GLUCAP 109* 139* 115* 161* 96   Iron Studies: No results for input(s): IRON, TIBC, TRANSFERRIN, FERRITIN in the last 72 hours. Lab Results  Component Value Date   INR 1.5 (H) 06/09/2021   INR 1.5 (H) 06/08/2021   INR 1.3 (H) 06/07/2021   Studies/Results: NM Myocar Multi W/Spect W/Wall Motion / EF  Result Date: 06/21/2021 CLINICAL DATA:  71 year old female with chest pain and shortness of breath. History of CABG. EXAM: MYOCARDIAL IMAGING WITH SPECT (REST AND PHARMACOLOGIC-STRESS) GATED LEFT VENTRICULAR WALL MOTION STUDY LEFT VENTRICULAR EJECTION FRACTION TECHNIQUE: Standard myocardial SPECT imaging was performed after resting intravenous injection of 11 mCi Tc-69m tetrofosmin. Subsequently, intravenous infusion of Lexiscan was performed under the supervision of the Cardiology staff. At peak effect  of the drug, 33 mCi Tc-17m tetrofosmin was injected intravenously and standard myocardial SPECT imaging was performed. Quantitative gated imaging was also performed to evaluate left ventricular wall motion, and estimate left ventricular ejection fraction. COMPARISON:  None. FINDINGS: Perfusion: No decreased activity in the left ventricle on stress imaging to suggest reversible ischemia. A moderate fixed apical defect noted compatible with prior infarct/scar. Wall Motion: Severe apical hypokinesis noted. Mild general hypokinesis and moderate septal hypokinesis noted. Left Ventricular Ejection Fraction: 41 % End diastolic volume 638 ml End systolic volume 75 ml IMPRESSION: 1. No reversible ischemia.  Moderate apical scarring/infarct. 2.  Generalized hypokinesis, severe at  the apex. 3. Left ventricular ejection fraction 41%. Moderately abnormal LV systolic volume. 4. Non invasive risk stratification*: Intermediate *2012 Appropriate Use Criteria for Coronary Revascularization Focused Update: J Am Coll Cardiol. 4665;99(3):570-177. http://[email or web address] Electronically Signed   By: Margarette Canada M.D.   On: 06/21/2021 11:19    Medications:  sodium chloride     sodium chloride     sodium chloride     sodium chloride     sodium chloride      acetaminophen  500-1,000 mg Oral Once in dialysis   apixaban  2.5 mg Oral BID   Chlorhexidine Gluconate Cloth  6 each Topical Q0600   cloNIDine  0.1 mg Transdermal Q Sat   darbepoetin (ARANESP) injection - DIALYSIS  150 mcg Intravenous Q Sat-HD   doxercalciferol  3 mcg Intravenous Q T,Th,Sa-HD   isosorbide-hydrALAZINE  1 tablet Oral TID   losartan  100 mg Oral Daily   pregabalin  100 mg Oral QHS   regadenoson       regadenoson  0.4 mg Intravenous Once   rosuvastatin  10 mg Oral Daily   sodium chloride flush  3 mL Intravenous Q12H   sucroferric oxyhydroxide  1,000 mg Oral TID WC   torsemide  40 mg Oral Daily    Dialysis Orders: Unit: AF TTS  Time: 3:45  EDW: 45 kg  Flows: 400/A1.5x Bath: 2K/2Ca  Access: TDC  Heparin: 2000  ESA: Mircera 150  q 2 wks (start 1/19)  Fe: Venofer 50 q wk  VDRA: Hectorol 3 TIW   Assessment/Plan: Dyspnea/chest pain --CTA negative for PE. Spoke to Hospitalist. 2D Echo completed-EF 40-45%, recent troponins in 300s, Cardiology consulted today. Optimize volume with HD.  ESRD -  HD TTS. Continue on schedule. HD today. Hypertension/volume  - Hypertensive. Continue home meds. Hopeful BP will improve after HD today. Push UF. Anemia  - Hgb trending downward-now 7.5.Plan to give Aranesp with HD today. Metabolic bone disease - Continue home binders and hectorol  Nutrition - Renal diet with fluid restriction   Tobie Poet, NP [facility] 06/21/2021,4:15 PM  LOS: 0 days

## 2021-06-21 NOTE — Care Management (Signed)
°  Transition of Care [facility]) Screening Note   Patient Details  Name: Monique Henderson Date of Birth: Aug 23, 1950   Transition of Care [facility]) CM/SW Contact:    Bethena Roys, RN Phone Number: 06/21/2021, 2:53 PM    Transition of Care Department [facility]) has reviewed the patient. Patient is currently active with [facility] for [facility] PT. Case Manager called Alvis Lemmings to make them aware that the patient will transition home on today. Patient may need assistance with transportation- CSW aware. No further home needs identified.

## 2021-06-21 NOTE — Plan of Care (Signed)

## 2021-06-21 NOTE — Discharge Summary (Signed)
Monique Henderson DXI:[id number] DOB: [birth year] DOA: 06/19/2021  PCP: Myrtie Neither, PA-C  Admit date: 06/19/2021 Discharge date: 06/21/2021  Admitted From: Home Disposition: Home  Recommendations for Outpatient Follow-up:  Follow up with PCP in 1 week Please obtain BMP/CBC in one week Please follow up cardiology as scheduled     Discharge Condition:Stable CODE STATUS: Full Diet recommendation: Renal diet    Brief/Interim Summary: Per JQB:HALPF renal disease on hemodialysis, hypertension, medical noncompliance comes in with pleuritic chest pain from coughing.  Patient was just discharged 5 to 6 days ago had her routine dialysis Tuesday she is due for dialysis today.  She denies any fevers.  She has been coughing still a lot.  At the time of discharge she was on room air not requiring any supplemental oxygen.  She went home and has not taken any of her medications because she did not feel like it.  There is been no nausea vomiting diarrhea.  She has had some swelling in her legs which is chronic.  She has also been short of breath.   hemodialysis.  She had dialysis on the day of admission.  Her troponins were elevated and an echocardiogram revealed left ventricular systolic dysfunction which was new.  Cardiology was consulted.  She had a Lexiscan that did not show any reversible ischemia and cardiology cleared patient for discharge.  She did have hemodialysis today.  Chest pain- Cardiomyopathy-new Elevated troponin CTA neg. For PE Echo with EF 40-45% Cardiology consulted Patient underwent nuclear stress test today that did not reveal any reversible ischemia.  Hydralazine dose was increased.  Isosorbide dinitrate was added. She will need to follow-up with cardiology as outpatient     Malignant hypertension- Hx/o non compliance Continue medications Counseled patient on importance of compliance with medications     Chronic obstructive lung disease ([place])- No acute  exacerbation       ESRD on dialysis [facility]) Nephrology was following Patient had dialysis today    Discharge Diagnoses:  Principal Problem:   Chest pain Active Problems:   Chronic obstructive lung disease (HCC)   Non compliance with medical treatment   History of CVA (cerebrovascular accident)   ESRD on dialysis [facility])   Malignant hypertension    Discharge Instructions  Discharge Instructions     Diet - low sodium heart healthy   Complete by: As directed    Renal diet, low potassium   Discharge instructions   Complete by: As directed    Follow-up with cardiology Follow-up with your PCP   Increase activity slowly   Complete by: As directed    No wound care   Complete by: As directed       Allergies as of 06/21/2021       Reactions   Shrimp [shellfish Allergy] Shortness Of Breath   Bactroban [mupirocin] Other (See Comments)   "Sores in nose"   Vicodin [hydrocodone-acetaminophen] Itching, Nausea And Vomiting   This is patient's home medication   Eliquis [apixaban] Itching   Spoke with patient, no rash   Lisinopril Cough   Tylenol [acetaminophen] Itching        Medication List     TAKE these medications    albuterol 108 (90 Base) MCG/ACT inhaler Commonly known as: VENTOLIN HFA Inhale 2 puffs into the lungs every 6 (six) hours as needed for wheezing or shortness of breath.   amLODipine 10 MG tablet Commonly known as: NORVASC Take 1 tablet (10 mg total) by mouth daily.   cloNIDine 0.1  mg/24hr patch Commonly known as: CATAPRES - Dosed in mg/24 hr Place 1 patch (0.1 mg total) onto the skin every Saturday.   cyclobenzaprine 5 MG tablet Commonly known as: FLEXERIL Take 5 mg by mouth 2 (two) times daily as needed for muscle spasms.   Eliquis 2.5 MG Tabs tablet Generic drug: apixaban Take 1 tablet (2.5 mg total) by mouth 2 (two) times daily.   hydrALAZINE 50 MG tablet Commonly known as: APRESOLINE Take 1 tablet (50 mg total) by mouth 3 (three) times  daily. What changed: when to take this   isosorbide dinitrate 40 MG tablet Commonly known as: ISORDIL Take 1 tablet (40 mg total) by mouth 3 (three) times daily.   loperamide 2 MG capsule Commonly known as: IMODIUM Take 2 mg by mouth as needed for diarrhea or loose stools.   losartan 100 MG tablet Commonly known as: COZAAR Take 1 tablet (100 mg total) by mouth daily.   mirtazapine 7.5 MG tablet Commonly known as: REMERON Take 1 tablet (7.5 mg total) by mouth at bedtime. What changed:  when to take this reasons to take this   Narcan 4 MG/0.1ML Liqd nasal spray kit Generic drug: naloxone Place 1 spray into the nose as needed for opioid reversal. If no response within 3 minutes then repeat once in the other nostril.  Call 911.   ondansetron 4 MG tablet Commonly known as: ZOFRAN Take 1 tablet (4 mg total) by mouth every 8 (eight) hours as needed for nausea or vomiting.   pregabalin 50 MG capsule Commonly known as: LYRICA Take 100 mg by mouth at bedtime.   rosuvastatin 10 MG tablet Commonly known as: CRESTOR Take 1 tablet (10 mg total) by mouth daily.   sevelamer carbonate 2.4 g Pack Commonly known as: RENVELA Take 2.4 g by mouth 3 (three) times daily with meals.   torsemide 20 MG tablet Commonly known as: DEMADEX Take 40 mg by mouth daily.   Velphoro 500 MG chewable tablet Generic drug: sucroferric oxyhydroxide Chew 1,000 mg by mouth 3 (three) times daily.   VITAMIN D PO Take 1 tablet by mouth daily.        Follow-up Information     Patwardhan, Reynold Bowen, MD. Call in 2 day(s).   Specialties: Cardiology, Radiology Why: Please follow up in 2 weeks for further medication titration and for hospital follow up. Contact information: [place] [address] [phone]         Myrtie Neither, PA-C Follow up in 1 week(s).   Specialty: Physician Assistant Contact information: [place]  [address] [phone]                Allergies  Allergen Reactions   Shrimp [Shellfish Allergy] Shortness Of Breath   Bactroban [Mupirocin] Other (See Comments)    "Sores in nose"   Vicodin [Hydrocodone-Acetaminophen] Itching and Nausea And Vomiting    This is patient's home medication   Eliquis [Apixaban] Itching    Spoke with patient, no rash   Lisinopril Cough   Tylenol [Acetaminophen] Itching    Consultations: Nephrology  cardiology   Procedures/Studies: DG Chest 2 View  Result Date: 06/19/2021 CLINICAL DATA:  Cough and congestion. EXAM: CHEST - 2 VIEW COMPARISON:  06/07/2021. FINDINGS: The heart is enlarged and the mediastinal contour is stable. Atherosclerotic calcification of the aorta is noted. The pulmonary vasculature is mildly distended. Interstitial prominence is noted in the mid to lower lung fields bilaterally.  No effusion or pneumothorax is seen. Sternotomy wires are present over the midline. A right internal jugular central venous catheter is stable. IMPRESSION: 1. Cardiomegaly with mildly distended pulmonary vasculature. 2. Interstitial prominence in the mid to lower lung fields bilaterally, possible edema or infiltrate. Electronically Signed   By: Brett Fairy M.D.   On: 06/19/2021 02:25   CT HEAD WO CONTRAST (5MM)  Result Date: 06/07/2021 CLINICAL DATA:  71 year old female positive COVID-19. Altered mental status. EXAM: CT HEAD WITHOUT CONTRAST TECHNIQUE: Contiguous axial images were obtained from the base of the skull through the vertex without intravenous contrast. COMPARISON:  Brain MRI 05/21/2019. Head CT 05/19/2019. FINDINGS: Brain: No midline shift, ventriculomegaly, mass effect, evidence of mass lesion, intracranial hemorrhage or evidence of cortically based acute infarction. Patchy and confluent bilateral white matter hypodensity appears not significantly changed since 2020. Chronic heterogeneity in the bilateral deep gray matter nuclei most  pronounced in the left caudate. No cortical encephalomalacia identified. Vascular: Calcified atherosclerosis at the skull base. No suspicious intracranial vascular hyperdensity. Skull: Negative. Sinuses/Orbits: Visualized paranasal sinuses and mastoids are stable and well aerated. Other: Visualized orbits and scalp soft tissues are within normal limits. IMPRESSION: 1. No acute intracranial abnormality. 2. Stable non contrast CT appearance of chronic small vessel disease since 2020. Electronically Signed   By: Genevie Ann M.D.   On: 06/07/2021 12:13   CT Angio Chest Pulmonary Embolism (PE) W or WO Contrast  Result Date: 06/19/2021 CLINICAL DATA:  Chest pain or SOB, pleurisy or effusion suspected EXAM: CT ANGIOGRAPHY CHEST WITH CONTRAST TECHNIQUE: Multidetector CT imaging of the chest was performed using the standard protocol during bolus administration of intravenous contrast. Multiplanar CT image reconstructions and MIPs were obtained to evaluate the vascular anatomy. RADIATION DOSE REDUCTION: This exam was performed according to the departmental dose-optimization program which includes automated exposure control, adjustment of the mA and/or kV according to patient size and/or use of iterative reconstruction technique. CONTRAST:  12m OMNIPAQUE IOHEXOL 350 MG/ML SOLN COMPARISON:  06/05/2021 FINDINGS: Cardiovascular: Preferential opacification of the pulmonary arteries with satisfactory opacification to the segmental level. No evidence of pulmonary embolism. Stable cardiomegaly. No pericardial effusion. Mediastinum/Nodes: No enlarged nodes identified. Lungs/Pleura: Persistent small right pleural effusion has decreased since the prior study. Lung aeration has improved with significantly reduced ground-glass opacities. There is bibasilar atelectasis. Upper Abdomen: No acute abnormality. Musculoskeletal: Body wall edema.  No acute osseous abnormality. Review of the MIP images confirms the above findings. IMPRESSION: No  evidence of acute pulmonary embolism. Significantly improved lung aeration. Decreased, now small right pleural effusion. Stable cardiomegaly. Electronically Signed   By: PMacy MisM.D.   On: 06/19/2021 09:04   NM Myocar Multi W/Spect W/Wall Motion / EF  Result Date: 06/21/2021 CLINICAL DATA:  [age]year old female with chest pain and shortness of breath. History of CABG. EXAM: MYOCARDIAL IMAGING WITH SPECT (REST AND PHARMACOLOGIC-STRESS) GATED LEFT VENTRICULAR WALL MOTION STUDY LEFT VENTRICULAR EJECTION FRACTION TECHNIQUE: Standard myocardial SPECT imaging was performed after resting intravenous injection of 11 mCi Tc-990metrofosmin. Subsequently, intravenous infusion of Lexiscan was performed under the supervision of the Cardiology staff. At peak effect of the drug, 33 mCi Tc-9954mtrofosmin was injected intravenously and standard myocardial SPECT imaging was performed. Quantitative gated imaging was also performed to evaluate left ventricular wall motion, and estimate left ventricular ejection fraction. COMPARISON:  None. FINDINGS: Perfusion: No decreased activity in the left ventricle on stress imaging to suggest reversible ischemia. A moderate fixed apical defect noted compatible with prior infarct/scar.  Wall Motion: Severe apical hypokinesis noted. Mild general hypokinesis and moderate septal hypokinesis noted. Left Ventricular Ejection Fraction: 41 % End diastolic volume 161 ml End systolic volume 75 ml IMPRESSION: 1. No reversible ischemia.  Moderate apical scarring/infarct. 2.  Generalized hypokinesis, severe at the apex. 3. Left ventricular ejection fraction 41%. Moderately abnormal LV systolic volume. 4. Non invasive risk stratification*: Intermediate *2012 Appropriate Use Criteria for Coronary Revascularization Focused Update: J Am Coll Cardiol. 0960;45(4):098-119. http://content.airportbarriers.com.aspx?articleid=[email or web address] Electronically Signed   By: Margarette Canada M.D.   On: 06/21/2021 11:19   DG  CHEST PORT 1 VIEW  Result Date: 06/07/2021 CLINICAL DATA:  Fever.  Shortness of breath.  Follow-up exam. EXAM: PORTABLE CHEST 1 VIEW COMPARISON:  06/04/2021 and older exams.  CT, 06/05/2021. FINDINGS: Stable cardiomegaly. Right internal jugular dual lumen tunneled central venous catheter, also stable, tip in the right atrium. Lung opacities have improved. Minimal residual opacity noted in the medial lung bases. No new lung abnormalities. No pneumothorax. IMPRESSION: 1. Significant interval improvement in lung opacities consistent with improved pulmonary edema (favored) or multifocal pneumonia. Electronically Signed   By: Lajean Manes M.D.   On: 06/07/2021 16:16   DG Chest Portable 1 View  Result Date: 06/04/2021 CLINICAL DATA:  Chest pain EXAM: PORTABLE CHEST 1 VIEW COMPARISON:  02/11/2021 FINDINGS: Diffuse interstitial pulmonary infiltrate is present most in keeping with mild interstitial pulmonary edema, likely cardiogenic in nature. The lungs are symmetrically well expanded. No pneumothorax or pleural effusion. Cardiac size is mildly enlarged, unchanged. Right internal jugular hemodialysis catheter tips are seen within the right atrium. Surgical clips overlie the right axilla and right lung base. No acute bone abnormality. IMPRESSION: Stable cardiomegaly.  Mild cardiogenic failure. Electronically Signed   By: Fidela Salisbury M.D.   On: 06/04/2021 19:18   CT ANGIO CHEST AORTA W/CM & OR WO/CM  Result Date: 06/05/2021 CLINICAL DATA:  Acute aortic syndrome suspected. EXAM: CT ANGIOGRAPHY CHEST WITH CONTRAST TECHNIQUE: Multidetector CT imaging of the chest was performed using the standard protocol during bolus administration of intravenous contrast. Multiplanar CT image reconstructions and MIPs were obtained to evaluate the vascular anatomy. CONTRAST:  51m OMNIPAQUE IOHEXOL 350 MG/ML SOLN COMPARISON:  06/08/2019. FINDINGS: Cardiovascular: No aortic intramural hemotoma. Preferential opacification of the  thoracic aorta. No evidence of thoracic aortic aneurysm or dissection. The heart is enlarged and there is no pericardial effusion. The distal tip of a right central venous catheter terminates in the right atrium. The pulmonary trunk is normal in caliber. Mediastinum/Nodes: No enlarged mediastinal, hilar, or axillary lymph nodes. Thyroid gland, trachea, and esophagus demonstrate no significant findings. Lungs/Pleura: Hazy ground-glass opacities are present in the lungs bilaterally with relative subpleural sparing. There is a moderate right pleural effusion and a trace left pleural effusion. No pneumothorax. Upper Abdomen: There is reflux of contrast into the inferior vena cava and hepatic veins which may be associated with right heart failure. Musculoskeletal: Sternotomy wires are noted over the midline. Degenerative changes are noted in the thoracic spine. No acute osseous abnormality. Review of the MIP images confirms the above findings. IMPRESSION: 1. Aortic atherosclerosis with no evidence of aortic dissection, aneurysm, or intramural hematoma. 2. Hazy airspace disease in the lungs bilaterally, suggesting edema, less likely infiltrate. 3. Moderate right pleural effusion and trace left pleural effusion. 4. Cardiomegaly. Electronically Signed   By: LBrett FairyM.D.   On: 06/05/2021 03:04   ECHOCARDIOGRAM COMPLETE  Result Date: 06/19/2021    ECHOCARDIOGRAM REPORT   Patient Name:   Sakeenah  F Lanzo Date of Exam: 06/19/2021 Medical Rec #:  [id number]          Height:       63.0 in Accession #:    [id number]         Weight:       106.3 lb Date of Birth:  01-16-1951           BSA:          1.478 m Patient Age:    17 years           BP:           167/101 mmHg Patient Gender: F                  HR:           85 bpm. Exam Location:  Inpatient Procedure: 2D Echo, Cardiac Doppler and Color Doppler Indications:    R07.9* Chest pain, unspecified  History:        Patient has prior history of Echocardiogram examinations,  most                 recent 09/22/2020. Arrythmias:Atrial Fibrillation; Risk                 Factors:Current Smoker and Hypertension.  Sonographer:    Bernadene Person RDCS Referring Phys: [place]  1. Apical hypokinesis with overall mild to moderate LV dysfunction; severe LVH.  2. Left ventricular ejection fraction, by estimation, is 40 to 45%. The left ventricle has mild to moderately decreased function. The left ventricle demonstrates regional wall motion abnormalities (see scoring diagram/findings for description). There is  severe concentric left ventricular hypertrophy. Left ventricular diastolic parameters are consistent with Grade III diastolic dysfunction (restrictive). Elevated left atrial pressure.  3. Right ventricular systolic function is normal. The right ventricular size is normal. There is severely elevated pulmonary artery systolic pressure.  4. Left atrial size was moderately dilated.  5. RA catheter.  6. The mitral valve is normal in structure. Mild mitral valve regurgitation. No evidence of mitral stenosis.  7. Tricuspid valve regurgitation is severe.  8. The aortic valve is tricuspid. Aortic valve regurgitation is trivial. No aortic stenosis is present.  9. The inferior vena cava is normal in size with greater than 50% respiratory variability, suggesting right atrial pressure of 3 mmHg. FINDINGS  Left Ventricle: Left ventricular ejection fraction, by estimation, is 40 to 45%. The left ventricle has mild to moderately decreased function. The left ventricle demonstrates regional wall motion abnormalities. The left ventricular internal cavity size was normal in size. There is severe concentric left ventricular hypertrophy. Left ventricular diastolic parameters are consistent with Grade III diastolic dysfunction (restrictive). Elevated left atrial pressure. Right Ventricle: The right ventricular size is normal. Right ventricular systolic function is normal. There is severely  elevated pulmonary artery systolic pressure. The tricuspid regurgitant velocity is 4.14 m/s, and with an assumed right atrial pressure  of 3 mmHg, the estimated right ventricular systolic pressure is 43.1 mmHg. Left Atrium: Left atrial size was moderately dilated. Right Atrium: Right atrial size was normal in size. RA catheter. Pericardium: There is no evidence of pericardial effusion. Mitral Valve: The mitral valve is normal in structure. Mild mitral valve regurgitation. No evidence of mitral valve stenosis. Tricuspid Valve: The tricuspid valve is normal in structure. Tricuspid valve regurgitation is severe. No evidence of tricuspid stenosis. Aortic Valve: The aortic valve is tricuspid. Aortic valve regurgitation is trivial. No aortic stenosis  is present. Pulmonic Valve: The pulmonic valve was normal in structure. Pulmonic valve regurgitation is not visualized. No evidence of pulmonic stenosis. Aorta: The aortic root is normal in size and structure. Venous: The inferior vena cava is normal in size with greater than 50% respiratory variability, suggesting right atrial pressure of 3 mmHg. IAS/Shunts: No atrial level shunt detected by color flow Doppler. Additional Comments: Apical hypokinesis with overall mild to moderate LV dysfunction; severe LVH.  LEFT VENTRICLE PLAX 2D LVIDd:         4.00 cm      Diastology LVIDs:         3.40 cm      LV e' medial:    4.95 cm/s LV PW:         1.80 cm      LV E/e' medial:  30.3 LV IVS:        1.10 cm      LV e' lateral:   6.08 cm/s LVOT diam:     1.80 cm      LV E/e' lateral: 24.7 LV SV:         43 LV SV Index:   29 LVOT Area:     2.54 cm  LV Volumes (MOD) LV vol d, MOD A2C: 125.0 ml LV vol d, MOD A4C: 105.0 ml LV vol s, MOD A2C: 73.3 ml LV vol s, MOD A4C: 61.0 ml LV SV MOD A2C:     51.7 ml LV SV MOD A4C:     105.0 ml LV SV MOD BP:      52.1 ml RIGHT VENTRICLE RV S prime:     9.21 cm/s TAPSE (M-mode): 1.6 cm LEFT ATRIUM             Index        RIGHT ATRIUM           Index LA  diam:        4.20 cm 2.84 cm/m   RA Area:     17.80 cm LA Vol (A2C):   59.8 ml 40.46 ml/m  RA Volume:   47.40 ml  32.07 ml/m LA Vol (A4C):   58.4 ml 39.52 ml/m LA Biplane Vol: 63.1 ml 42.70 ml/m  AORTIC VALVE LVOT Vmax:   97.00 cm/s LVOT Vmean:  60.600 cm/s LVOT VTI:    0.168 m  AORTA Ao Root diam: 2.60 cm Ao Asc diam:  2.70 cm MITRAL VALVE                  TRICUSPID VALVE MV Area (PHT): 5.38 cm       TR Peak grad:   68.6 mmHg MV Decel Time: 141 msec       TR Vmax:        414.00 cm/s MR Peak grad:    134.1 mmHg MR Mean grad:    100.0 mmHg   SHUNTS MR Vmax:         579.00 cm/s  Systemic VTI:  0.17 m MR Vmean:        486.0 cm/s   Systemic Diam: 1.80 cm MR PISA:         0.25 cm MR PISA Eff ROA: 1 mm MR PISA Radius:  0.20 cm MV E velocity: 150.00 cm/s MV A velocity: 71.70 cm/s MV E/A ratio:  2.09 Kirk Ruths MD Electronically signed by Kirk Ruths MD Signature Date/Time: 06/19/2021/2:22:16 PM    Final       Subjective: In foul mood. Denis sob, cp  or any other sx.   Discharge Exam: Vitals:   06/21/21 0942 06/21/21 0949  BP: (!) 197/94 (!) 187/91  Pulse: (!) 104 96  Resp:    Temp:    SpO2:     Vitals:   06/21/21 0937 06/21/21 0940 06/21/21 0942 06/21/21 0949  BP: (!) 169/103 (!) 198/93 (!) 197/94 (!) 187/91  Pulse: (!) 108 (!) 108 (!) 104 96  Resp:      Temp:      TempSrc:      SpO2:      Weight:      Height:        General: Pt is alert, awake, not in acute distress Cardiovascular: RRR, S1/S2 +, no rubs, no gallops Respiratory: CTA bilaterally, no wheezing, no rhonchi Abdominal: Soft, NT, ND, bowel sounds + Extremities: no edema, no cyanosis    The results of significant diagnostics from this hospitalization (including imaging, microbiology, ancillary and laboratory) are listed below for reference.     Microbiology: Recent Results (from the past 240 hour(s))  Culture, blood (routine x 2)     Status: None (Preliminary result)   Collection Time: 06/19/21  3:45 AM    Specimen: BLOOD RIGHT FOREARM  Result Value Ref Range Status   Specimen Description BLOOD RIGHT FOREARM  Final   Special Requests AEROBIC BOTTLE ONLY Blood Culture adequate volume  Final   Culture   Final    NO GROWTH 2 DAYS Performed at [facility], [address], [place], [place] [address]    Report Status PENDING  Incomplete  Culture, blood (routine x 2)     Status: None (Preliminary result)   Collection Time: 06/19/21  3:55 AM   Specimen: BLOOD  Result Value Ref Range Status   Specimen Description BLOOD BLOOD RIGHT WRIST  Final   Special Requests   Final    BOTTLES DRAWN AEROBIC AND ANAEROBIC Blood Culture adequate volume   Culture   Final    NO GROWTH 2 DAYS Performed at [facility], [place] [name], [place], [place] [address]    Report Status PENDING  Incomplete     Labs: BNP (last 3 results) Recent Labs    09/21/20 2300 09/23/20 0400 06/20/21 1808  BNP 3,659.7* 2,218.0* >0,258.5*   Basic Metabolic Panel: Recent Labs  Lab 06/19/21 0155 06/20/21 0219  NA 140 137  K 5.6* 4.2  CL 105 99  CO2 20* 26  GLUCOSE 115* 96  BUN 98* 38*  CREATININE 12.90* 6.65*  CALCIUM 8.7* 8.0*   Liver Function Tests: Recent Labs  Lab 06/19/21 0155  AST 17  ALT 12  ALKPHOS 89  BILITOT 0.5  PROT 6.9  ALBUMIN 3.3*   No results for input(s): LIPASE, AMYLASE in the last 168 hours. No results for input(s): AMMONIA in the last 168 hours. CBC: Recent Labs  Lab 06/19/21 0155 06/20/21 0219  WBC 6.3 3.9*  NEUTROABS 4.7  --   HGB 8.7* 7.8*  HCT 28.2* 24.6*  MCV 94.9 93.2  PLT 282 237   Cardiac Enzymes: No results for input(s): CKTOTAL, CKMB, CKMBINDEX, TROPONINI in the last 168 hours. BNP: Invalid input(s): POCBNP CBG: Recent Labs  Lab 06/20/21 1129 06/20/21 1549 06/20/21 1956 06/20/21 2340 06/21/21 0324  GLUCAP 109* 139* 115* 161* 96   D-Dimer No results for input(s): DDIMER in the last 72 hours. Hgb A1c Recent Labs    06/20/21 1808  HGBA1C  5.1   Lipid Profile Recent Labs  06/21/21 0229  CHOL 86  HDL 42  LDLCALC 40  TRIG 21  CHOLHDL 2.0   Thyroid function studies Recent Labs    06/20/21 1808  TSH 0.189*   Anemia work up No results for input(s): VITAMINB12, FOLATE, FERRITIN, TIBC, IRON, RETICCTPCT in the last 72 hours. Urinalysis    Component Value Date/Time   COLORURINE STRAW (A) 09/21/2020 2018   APPEARANCEUR HAZY (A) 09/21/2020 2018   LABSPEC 1.010 09/21/2020 2018   PHURINE 7.0 09/21/2020 2018   GLUCOSEU NEGATIVE 09/21/2020 2018   GLUCOSEU NEG mg/dL 10/30/2009 1944   HGBUR NEGATIVE 09/21/2020 2018   BILIRUBINUR NEGATIVE 09/21/2020 2018   BILIRUBINUR negative 05/04/2018 0910   KETONESUR NEGATIVE 09/21/2020 2018   PROTEINUR 100 (A) 09/21/2020 2018   UROBILINOGEN 0.2 05/04/2018 0910   UROBILINOGEN 1 02/14/2014 0946   NITRITE NEGATIVE 09/21/2020 2018   LEUKOCYTESUR LARGE (A) 09/21/2020 2018   Sepsis Labs Invalid input(s): PROCALCITONIN,  WBC,  LACTICIDVEN Microbiology Recent Results (from the past 240 hour(s))  Culture, blood (routine x 2)     Status: None (Preliminary result)   Collection Time: 06/19/21  3:45 AM   Specimen: BLOOD RIGHT FOREARM  Result Value Ref Range Status   Specimen Description BLOOD RIGHT FOREARM  Final   Special Requests AEROBIC BOTTLE ONLY Blood Culture adequate volume  Final   Culture   Final    NO GROWTH 2 DAYS Performed at [facility], [address] [place], [place] [address]    Report Status PENDING  Incomplete  Culture, blood (routine x 2)     Status: None (Preliminary result)   Collection Time: 06/19/21  3:55 AM   Specimen: BLOOD  Result Value Ref Range Status   Specimen Description BLOOD BLOOD RIGHT WRIST  Final   Special Requests   Final    BOTTLES DRAWN AEROBIC AND ANAEROBIC Blood Culture adequate volume   Culture   Final    NO GROWTH 2 DAYS Performed at [facility], [place] [address] [place], [place] [address]    Report Status PENDING   Incomplete     Time coordinating discharge: Over 30 minutes  SIGNED:   Nolberto Hanlon, MD  [facility] 06/21/2021, 1:49 PM Pager   If 7PM-7AM, please contact night-coverage [email or web address] Password [email or web address]

## 2021-06-21 NOTE — Progress Notes (Addendum)
Progress Note  Patient Name: Monique Henderson Date of Encounter: 06/21/2021  Attending physician: Nolberto Hanlon, MD Primary care provider: Myrtie Neither, PA-C Primary Cardiologist: Dr. Vernell Leep  Subjective: Monique Henderson is a 71 y.o. female who was seen and examined prior to her nuclear stress test No events overnight. Currently denies chest pain. Hypertensive-has not received her morning medications. Wishes to go home. Case discussed and reviewed with her nurse.  Objective: Vital Signs in the last 24 hours: Temp:  [98.3 F (36.8 C)-98.8 F (37.1 C)] 98.3 F (36.8 C) (01/21 0300) Pulse Rate:  [78-108] 96 (01/21 0949) Resp:  [16-24] 16 (01/21 0300) BP: (130-213)/(76-119) 187/91 (01/21 0949) SpO2:  [94 %-100 %] 95 % (01/21 0300)  Intake/Output: No intake or output data in the 24 hours ending 06/21/21 0951  Net IO Since Admission: -2,385 mL [06/21/21 0951]   Physical examination: PHYSICAL EXAM: Vitals with BMI 06/21/2021 06/21/2021 06/21/2021  Height - - -  Weight - - -  BMI - - -  Systolic 235 (No Data) 573  Diastolic 91 (No Data) 94  Pulse 96 - 104    CONSTITUTIONAL: Appears older than stated age, hemodynamically stable, No acute distress.  SKIN: Skin is warm and dry. No rash noted. No cyanosis. No pallor. No jaundice HEAD: Normocephalic and atraumatic.  EYES: No scleral icterus MOUTH/THROAT: Moist oral membranes.  NECK: No JVD present. No thyromegaly noted. No carotid bruits  LYMPHATIC: No visible cervical adenopathy.  CHEST Normal respiratory effort. No intercostal retractions, dialysis catheter on the right anterior chest wall LUNGS: Clear to auscultation bilaterally.  No stridor. No wheezes. No rales.  CARDIOVASCULAR: Regular rate and rhythm, positive S1-S2, 3 out of 6 systolic ejection murmur heard over the left lower sternal border, rubs or gallops appreciated ABDOMINAL: Nonobese, soft, nontender, nondistended, positive bowel sounds in all 4  quadrants, no apparent ascites.  EXTREMITIES: No pitting edema, warm to touch.  HEMATOLOGIC: No significant bruising NEUROLOGIC: Oriented to person, place, and time. Nonfocal. Normal muscle tone.  PSYCHIATRIC: Normal mood and affect. Normal behavior. Cooperative  Lab Results: Hematology Recent Labs  Lab 06/19/21 0155 06/20/21 0219  WBC 6.3 3.9*  RBC 2.97* 2.64*  HGB 8.7* 7.8*  HCT 28.2* 24.6*  MCV 94.9 93.2  MCH 29.3 29.5  MCHC 30.9 31.7  RDW 20.7* 20.1*  PLT 282 237    Chemistry Recent Labs  Lab 06/19/21 0155 06/20/21 0219  NA 140 137  K 5.6* 4.2  CL 105 99  CO2 20* 26  GLUCOSE 115* 96  BUN 98* 38*  CREATININE 12.90* 6.65*  CALCIUM 8.7* 8.0*  PROT 6.9  --   ALBUMIN 3.3*  --   AST 17  --   ALT 12  --   ALKPHOS 89  --   BILITOT 0.5  --   GFRNONAA 3* 6*  ANIONGAP 15 12     Cardiac Enzymes: Cardiac Panel (last 3 results) Recent Labs    06/19/21 0155 06/19/21 0430  TROPONINIHS 354* 310*    BNP (last 3 results) Recent Labs    09/21/20 2300 09/23/20 0400 06/20/21 1808  BNP 3,659.7* 2,218.0* >4,500.0*    ProBNP (last 3 results) No results for input(s): PROBNP in the last 8760 hours.   DDimer No results for input(s): DDIMER in the last 168 hours.   Hemoglobin A1c:  Lab Results  Component Value Date   HGBA1C 5.1 06/20/2021   MPG 100 06/20/2021    TSH  Recent Labs  06/20/21 1808  TSH 0.189*    Lipid Panel  Lab Results  Component Value Date   CHOL 86 06/21/2021   HDL 42 06/21/2021   LDLCALC 40 06/21/2021   TRIG 21 06/21/2021   CHOLHDL 2.0 06/21/2021   Imaging: ECHOCARDIOGRAM COMPLETE  Result Date: 06/19/2021    ECHOCARDIOGRAM REPORT   Patient Name:   Monique Henderson Date of Exam: 06/19/2021 Medical Rec #:  [id number]          Height:       63.0 in Accession #:    [id number]         Weight:       106.3 lb Date of Birth:  1951-06-01           BSA:          1.478 m Patient Age:    58 years           BP:           167/101 mmHg Patient  Gender: F                  HR:           85 bpm. Exam Location:  Inpatient Procedure: 2D Echo, Cardiac Doppler and Color Doppler Indications:    R07.9* Chest pain, unspecified  History:        Patient has prior history of Echocardiogram examinations, most                 recent 09/22/2020. Arrythmias:Atrial Fibrillation; Risk                 Factors:Current Smoker and Hypertension.  Sonographer:    Bernadene Person RDCS Referring Phys: [place]  1. Apical hypokinesis with overall mild to moderate LV dysfunction; severe LVH.  2. Left ventricular ejection fraction, by estimation, is 40 to 45%. The left ventricle has mild to moderately decreased function. The left ventricle demonstrates regional wall motion abnormalities (see scoring diagram/findings for description). There is  severe concentric left ventricular hypertrophy. Left ventricular diastolic parameters are consistent with Grade III diastolic dysfunction (restrictive). Elevated left atrial pressure.  3. Right ventricular systolic function is normal. The right ventricular size is normal. There is severely elevated pulmonary artery systolic pressure.  4. Left atrial size was moderately dilated.  5. RA catheter.  6. The mitral valve is normal in structure. Mild mitral valve regurgitation. No evidence of mitral stenosis.  7. Tricuspid valve regurgitation is severe.  8. The aortic valve is tricuspid. Aortic valve regurgitation is trivial. No aortic stenosis is present.  9. The inferior vena cava is normal in size with greater than 50% respiratory variability, suggesting right atrial pressure of 3 mmHg. FINDINGS  Left Ventricle: Left ventricular ejection fraction, by estimation, is 40 to 45%. The left ventricle has mild to moderately decreased function. The left ventricle demonstrates regional wall motion abnormalities. The left ventricular internal cavity size was normal in size. There is severe concentric left ventricular hypertrophy. Left  ventricular diastolic parameters are consistent with Grade III diastolic dysfunction (restrictive). Elevated left atrial pressure. Right Ventricle: The right ventricular size is normal. Right ventricular systolic function is normal. There is severely elevated pulmonary artery systolic pressure. The tricuspid regurgitant velocity is 4.14 m/s, and with an assumed right atrial pressure  of 3 mmHg, the estimated right ventricular systolic pressure is 80.3 mmHg. Left Atrium: Left atrial size was moderately dilated. Right Atrium: Right atrial size was normal in size.  RA catheter. Pericardium: There is no evidence of pericardial effusion. Mitral Valve: The mitral valve is normal in structure. Mild mitral valve regurgitation. No evidence of mitral valve stenosis. Tricuspid Valve: The tricuspid valve is normal in structure. Tricuspid valve regurgitation is severe. No evidence of tricuspid stenosis. Aortic Valve: The aortic valve is tricuspid. Aortic valve regurgitation is trivial. No aortic stenosis is present. Pulmonic Valve: The pulmonic valve was normal in structure. Pulmonic valve regurgitation is not visualized. No evidence of pulmonic stenosis. Aorta: The aortic root is normal in size and structure. Venous: The inferior vena cava is normal in size with greater than 50% respiratory variability, suggesting right atrial pressure of 3 mmHg. IAS/Shunts: No atrial level shunt detected by color flow Doppler. Additional Comments: Apical hypokinesis with overall mild to moderate LV dysfunction; severe LVH.  LEFT VENTRICLE PLAX 2D LVIDd:         4.00 cm      Diastology LVIDs:         3.40 cm      LV e' medial:    4.95 cm/s LV PW:         1.80 cm      LV E/e' medial:  30.3 LV IVS:        1.10 cm      LV e' lateral:   6.08 cm/s LVOT diam:     1.80 cm      LV E/e' lateral: 24.7 LV SV:         43 LV SV Index:   29 LVOT Area:     2.54 cm  LV Volumes (MOD) LV vol d, MOD A2C: 125.0 ml LV vol d, MOD A4C: 105.0 ml LV vol s, MOD A2C:  73.3 ml LV vol s, MOD A4C: 61.0 ml LV SV MOD A2C:     51.7 ml LV SV MOD A4C:     105.0 ml LV SV MOD BP:      52.1 ml RIGHT VENTRICLE RV S prime:     9.21 cm/s TAPSE (M-mode): 1.6 cm LEFT ATRIUM             Index        RIGHT ATRIUM           Index LA diam:        4.20 cm 2.84 cm/m   RA Area:     17.80 cm LA Vol (A2C):   59.8 ml 40.46 ml/m  RA Volume:   47.40 ml  32.07 ml/m LA Vol (A4C):   58.4 ml 39.52 ml/m LA Biplane Vol: 63.1 ml 42.70 ml/m  AORTIC VALVE LVOT Vmax:   97.00 cm/s LVOT Vmean:  60.600 cm/s LVOT VTI:    0.168 m  AORTA Ao Root diam: 2.60 cm Ao Asc diam:  2.70 cm MITRAL VALVE                  TRICUSPID VALVE MV Area (PHT): 5.38 cm       TR Peak grad:   68.6 mmHg MV Decel Time: 141 msec       TR Vmax:        414.00 cm/s MR Peak grad:    134.1 mmHg MR Mean grad:    100.0 mmHg   SHUNTS MR Vmax:         579.00 cm/s  Systemic VTI:  0.17 m MR Vmean:        486.0 cm/s   Systemic Diam: 1.80 cm MR PISA:  0.25 cm MR PISA Eff ROA: 1 mm MR PISA Radius:  0.20 cm MV E velocity: 150.00 cm/s MV A velocity: 71.70 cm/s MV E/A ratio:  2.09 Kirk Ruths MD Electronically signed by Kirk Ruths MD Signature Date/Time: 06/19/2021/2:22:16 PM    Final     CARDIAC DATABASE: EKG: 06/19/2021: NSR, 98 bpm, biatrial enlargement, without underlying injury pattern.   Echocardiogram: 05/12/2019: LVEF 65 to 70%.  Grade 2 diastolic dysfunction.  Please see formal report for additional details.   06/28/2019: LVEF 57%, moderate LVH, grade 2 diastolic dysfunction.  Please see formal report for additional details.   09/22/2020: LVEF 55-60%, apical hypokinesis, grade 2 diastolic dysfunction, please see formal report for additional details.   06/19/2021: LVEF 40-45%, severe LVH, grade 3 diastolic impairment, elevated LAP, moderately dilated left atrium, cardiac catheter, mild MR, severe TR.   Stress Testing:  Lexiscan (Walking with mod Bruce) Sestamibi Stress Test 06/28/2019: Nondiagnostic ECG stress. Normal  myocardial perfusion. Stress LV EF: 61%.  No previous exam available for comparison. Low risk study.    Heart Catheterization: None  Scheduled Meds:  apixaban  2.5 mg Oral BID   Chlorhexidine Gluconate Cloth  6 each Topical Q0600   cloNIDine  0.1 mg Transdermal Q Sat   darbepoetin (ARANESP) injection - DIALYSIS  150 mcg Intravenous Q Sat-HD   doxercalciferol  3 mcg Intravenous Q T,Th,Sa-HD   isosorbide-hydrALAZINE  1 tablet Oral TID   losartan  100 mg Oral Daily   pregabalin  100 mg Oral QHS   regadenoson       regadenoson  0.4 mg Intravenous Once   rosuvastatin  10 mg Oral Daily   sodium chloride flush  3 mL Intravenous Q12H   sucroferric oxyhydroxide  1,000 mg Oral TID WC   torsemide  40 mg Oral Daily    Continuous Infusions:  sodium chloride     sodium chloride     sodium chloride      PRN Meds: sodium chloride, sodium chloride, sodium chloride, albuterol, alteplase, cyclobenzaprine, heparin, hydrALAZINE, lidocaine (PF), lidocaine-prilocaine, loperamide, mirtazapine, ondansetron, pentafluoroprop-tetrafluoroeth, sodium chloride flush   IMPRESSION & RECOMMENDATIONS: Monique Henderson is a 71 y.o. African-American female whose past medical history and cardiac risk factors include: h/o type A aortic dissection s/p repair (04/2018), former smoker,paroxysmal atrial fibrillation, h/o stroke (05/2019), moderate b/l carotid artery stenosis, ESRD on HD TTS, hypertension,h/o breast cancer, non-compliance, Hx of COVID (jan 2023).  Impression:  Cardiomyopathy, unspecified.  Precordial pain-noncardiac with multiple CAD risk factors including dialysis, history of type aortic dissection repair, history of PAF, history of stroke   Shortness of breath -present on admission likely due to hypertensive emergency, missed hemodialysis, volume overload: now resolved.    Paroxysmal atrial fibrillation.  History of CVA.   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  Noncompliance (medication and missed HD sessions)  Plan: Cardiology consulted during his hospitalization for evaluation of elevated troponin and recently discovered cardiomyopathy.  Patient is precordial discomfort appears to be noncardiac and more associated with her coughing episodes as she is status post COVID infection early January 2023.  High sensitive troponins were elevated during his hospitalization but not suggestive of ACS and explained by missed hemodialysis session, volume overload, and hypertensive crisis present on admission.  However, echocardiogram during his hospitalization noted reduction in LVEF with regional wall motion abnormalities.  Given her other cardiovascular risk factors as mentioned above the shared decision was to proceed with ischemic  work-up either outpatient versus inpatient.  Patient preferred to have it done prior to discharge and therefore was scheduled for Lexiscan this morning.  Results from [place] are still pending.  If this study is low risk she could be discharged home from a cardiovascular standpoint with close follow-up outpatient with Dr. Virgina Jock.  Her BNP is elevated however given her end-stage renal disease and nonoliguric state further up titration of diuretic therapy may not be beneficial.  She is due for hemodialysis later today.  We will defer volume management to nephrology and consider increasing ultrafiltration.  TSH below normal limits suggestive of possible hyperthyroidism -will defer additional management to PCP.  Educated in the importance of blood pressure management given her cardiomyopathy, history of aortic dissection repair.  She was transitioned off of amlodipine due to reduced LVEF and started on BiDil yesterday.  She needs close observation with her blood pressures and up titration of medical therapy.  Reemphasized the importance of a low-salt diet.  Fasting lipid profile  is within acceptable range.  Continue current dose of statin therapy.  UDS is still pending - has a history of cocaine use (atleast twice per EMR).   Patient's questions and concerns were addressed to her satisfaction. She voices understanding of the instructions provided during this encounter.   This note was created using a voice recognition software as a result there may be grammatical errors inadvertently enclosed that do not reflect the nature of this encounter. Every attempt is made to correct such errors.  Total time spent: 36 minutes  Monique Henderson [facility]  Pager: [phone] Office: [phone] 06/21/2021, 9:51 AM     ADDENDUM: Monique Henderson nuclear stress test: 06/21/2021 1. No reversible ischemia.  Moderate apical scarring/infarct.   2.  Generalized hypokinesis, severe at the apex.   3. Left ventricular ejection fraction 41%. Moderately abnormal LV systolic volume.   4. Non invasive risk stratification*: Intermediate   *2012 Appropriate Use Criteria for Coronary Revascularization Focused Update: J Am Coll Cardiol. 012;59(9):857-881. http://content.airportbarriers.com.aspx?articleid=1201161  No reversible ischemia on recent stress test and she is currently chest pain free. Therefore, recommend improving her modifiable cardiovascular risk factors and outpatient follow-up.  Recommend up titration of antihypertensive medications.  If BiDil is expensive please transition to hydralazine 50 mg p.o. 3 times daily and isosorbide dinitrate 40 mg p.o. 3 times daily.   We will schedule an outpatient follow-up visit with Dr. Virgina Jock.  Plan of care discussed with attending physician.  She may be discharged home once cleared by medicine.  Rex Kras, [place], [facility]  Pager: [phone] Office: [phone] 11:52 AM

## 2021-06-23 ENCOUNTER — Telehealth: Payer: Self-pay | Admitting: Nephrology

## 2021-06-23 NOTE — Telephone Encounter (Signed)
Transition of Care Contact from [place]  Date of Discharge: 06/21/2020 Date of Contact: 06/23/2020 Method of contact: phone - attempted  Attempted to contact patient to discuss transition of care from inpatient admission.  Patient did not answer the phone and unable to leave a message.  Will attempt to call again and/or follow up at dialysis.  Jen Mow, PA-C [facility] Pager: [phone]

## 2021-06-23 NOTE — Telephone Encounter (Signed)
.  unsuc

## 2021-06-24 ENCOUNTER — Other Ambulatory Visit: Payer: Self-pay | Admitting: Registered Nurse

## 2021-06-24 DIAGNOSIS — I10 Essential (primary) hypertension: Secondary | ICD-10-CM

## 2021-06-24 LAB — CULTURE, BLOOD (ROUTINE X 2)
Culture: NO GROWTH
Culture: NO GROWTH
Special Requests: ADEQUATE
Special Requests: ADEQUATE

## 2021-07-07 ENCOUNTER — Ambulatory Visit: Payer: Medicare Other | Admitting: Cardiology

## 2021-07-15 ENCOUNTER — Emergency Department (HOSPITAL_COMMUNITY): Payer: Medicare Other

## 2021-07-15 ENCOUNTER — Encounter (HOSPITAL_COMMUNITY): Payer: Self-pay

## 2021-07-15 ENCOUNTER — Other Ambulatory Visit: Payer: Self-pay

## 2021-07-15 ENCOUNTER — Emergency Department (HOSPITAL_COMMUNITY)
Admission: EM | Admit: 2021-07-15 | Discharge: 2021-07-15 | Disposition: A | Discharge status: Home / Self Care | Payer: Medicare Other | Attending: Emergency Medicine | Admitting: Emergency Medicine

## 2021-07-15 DIAGNOSIS — R0602 Shortness of breath: Secondary | ICD-10-CM | POA: Diagnosis present

## 2021-07-15 DIAGNOSIS — M940 Chondrocostal junction syndrome [Tietze]: Secondary | ICD-10-CM

## 2021-07-15 DIAGNOSIS — Z20822 Contact with and (suspected) exposure to covid-19: Secondary | ICD-10-CM | POA: Diagnosis not present

## 2021-07-15 LAB — CBC
HCT: 39.5 % (ref 36.0–46.0)
Hemoglobin: 12 g/dL (ref 12.0–15.0)
MCH: 30.3 pg (ref 26.0–34.0)
MCHC: 30.4 g/dL (ref 30.0–36.0)
MCV: 99.7 fL (ref 80.0–100.0)
Platelets: 189 10*3/uL (ref 150–400)
RBC: 3.96 MIL/uL (ref 3.87–5.11)
RDW: 18.6 % — ABNORMAL HIGH (ref 11.5–15.5)
WBC: 6.2 10*3/uL (ref 4.0–10.5)
nRBC: 0 % (ref 0.0–0.2)

## 2021-07-15 LAB — COMPREHENSIVE METABOLIC PANEL
ALT: 18 U/L (ref 0–44)
AST: 21 U/L (ref 15–41)
Albumin: 4.1 g/dL (ref 3.5–5.0)
Alkaline Phosphatase: 71 U/L (ref 38–126)
Anion gap: 14 (ref 5–15)
BUN: 48 mg/dL — ABNORMAL HIGH (ref 8–23)
CO2: 25 mmol/L (ref 22–32)
Calcium: 9.6 mg/dL (ref 8.9–10.3)
Chloride: 99 mmol/L (ref 98–111)
Creatinine, Ser: 9.39 mg/dL — ABNORMAL HIGH (ref 0.44–1.00)
GFR, Estimated: 4 mL/min — ABNORMAL LOW (ref >60)
Glucose, Bld: 104 mg/dL — ABNORMAL HIGH (ref 70–99)
Potassium: 4.7 mmol/L (ref 3.5–5.1)
Sodium: 138 mmol/L (ref 135–145)
Total Bilirubin: 0.7 mg/dL (ref 0.3–1.2)
Total Protein: 7.7 g/dL (ref 6.5–8.1)

## 2021-07-15 LAB — RESP PANEL BY RT-PCR (FLU A&B, COVID) ARPGX2
Influenza A by PCR: NEGATIVE
Influenza B by PCR: NEGATIVE
SARS Coronavirus 2 by RT PCR: NEGATIVE

## 2021-07-15 LAB — TROPONIN I (HIGH SENSITIVITY): Troponin I (High Sensitivity): 236 ng/L (ref <18)

## 2021-07-15 LAB — BRAIN NATRIURETIC PEPTIDE: B Natriuretic Peptide: >4500 pg/mL — ABNORMAL HIGH (ref 0.0–100.0)

## 2021-07-15 LAB — LIPASE, BLOOD: Lipase: 42 U/L (ref 11–51)

## 2021-07-15 MED ORDER — FENTANYL CITRATE PF 50 MCG/ML IJ SOSY
50.0000 ug | PREFILLED_SYRINGE | Freq: Once | INTRAMUSCULAR | Status: AC
  Administered 2021-07-15: 50 ug via INTRAVENOUS
  Filled 2021-07-15: qty 1

## 2021-07-15 NOTE — ED Triage Notes (Signed)
Pt presents to the ED from home via [facility] with complaints of CP and SOB onset yesterday. Pt on dialysis and normally goes on Tu/Th/Sat, pt went early yesterday because of her SOB, thinking it would help and make her feel better. Pt given ASA 324mg  PO and 1 Duoneb via EMS, denied NTG and IV start. Pt says CP improved after ASA.

## 2021-07-15 NOTE — ED Provider Notes (Signed)
[facility] EMERGENCY DEPARTMENT Provider Note   CSN: [id number] Arrival date & time: 07/15/21  2019     History  Chief Complaint  Patient presents with   Chest Pain   Shortness of Breath    Monique Henderson is a 71 y.o. female with history of ESRD on dialysis Tuesday, Thursday and Saturday who presents by EMS for evaluation of chest pain and shortness of breath that started after dialysis yesterday.  Today is Tuesday, however she went to dialysis early yesterday thinking that it may improve her shortness of breath.  Symptoms have continued and today, and she states that she is unable to walk in her own house or down the stairs without triggering pain and shortness of breath.  She describes her chest pain as substernal and sharp/stabbing.  It is reproducible upon palpation.  Per EMS, she was given 324 mg ASA and a DuoNeb.  Chest pain improved after aspirin, however has returned at the time of my evaluation.  She denies abdominal pain, nausea, vomiting, fevers and palpitations.   Chest Pain Associated symptoms: shortness of breath   Shortness of Breath Associated symptoms: chest pain       Home Medications Prior to Admission medications   Medication Sig Start Date End Date Taking? Authorizing Provider  albuterol (VENTOLIN HFA) 108 (90 Base) MCG/ACT inhaler Inhale 2 puffs into the lungs every 6 (six) hours as needed for wheezing or shortness of breath. 07/30/20   Maximiano Coss, NP  amLODipine (NORVASC) 10 MG tablet Take 1 tablet (10 mg total) by mouth daily. 02/14/21   Maximiano Coss, NP  apixaban (ELIQUIS) 2.5 MG TABS tablet Take 1 tablet (2.5 mg total) by mouth 2 (two) times daily. 06/09/21   Caren Griffins, MD  cloNIDine (CATAPRES - DOSED IN MG/24 HR) 0.1 mg/24hr patch Place 1 patch (0.1 mg total) onto the skin every Saturday. 06/14/21   Caren Griffins, MD  cyclobenzaprine (FLEXERIL) 5 MG tablet Take 5 mg by mouth 2 (two) times daily as needed for muscle  spasms. 09/18/20   [provider]  hydrALAZINE (APRESOLINE) 50 MG tablet Take 1 tablet (50 mg total) by mouth 3 (three) times daily. 06/21/21 07/21/21  Nolberto Hanlon, MD  isosorbide dinitrate (ISORDIL) 40 MG tablet Take 1 tablet (40 mg total) by mouth 3 (three) times daily. 06/21/21 07/21/21  Nolberto Hanlon, MD  loperamide (IMODIUM) 2 MG capsule Take 2 mg by mouth as needed for diarrhea or loose stools. 07/15/20   [provider]  losartan (COZAAR) 100 MG tablet Take 1 tablet (100 mg total) by mouth daily. 02/14/21   Maximiano Coss, NP  mirtazapine (REMERON) 7.5 MG tablet Take 1 tablet (7.5 mg total) by mouth at bedtime. Patient taking differently: Take 7.5 mg by mouth at bedtime as needed (sleep). 07/15/20   Maximiano Coss, NP  [facility] 4 MG/0.1ML LIQD nasal spray kit Place 1 spray into the nose as needed for opioid reversal. If no response within 3 minutes then repeat once in the other nostril.  Call 911. 05/15/20   [provider]  ondansetron (ZOFRAN) 4 MG tablet Take 1 tablet (4 mg total) by mouth every 8 (eight) hours as needed for nausea or vomiting. 08/03/19   Daleen Squibb, MD  pregabalin (LYRICA) 50 MG capsule Take 100 mg by mouth at bedtime. 05/07/21   [provider]  rosuvastatin (CRESTOR) 10 MG tablet Take 1 tablet (10 mg total) by mouth daily. 02/14/21 06/19/21  Maximiano Coss,  NP  sevelamer carbonate (RENVELA) 2.4 g PACK Take 2.4 g by mouth 3 (three) times daily with meals. 05/15/21   [provider]  torsemide (DEMADEX) 20 MG tablet Take 40 mg by mouth daily. 05/19/21   [provider]  VELPHORO 500 MG chewable tablet Chew 1,000 mg by mouth 3 (three) times daily. 05/19/21   [provider]  VITAMIN D PO Take 1 tablet by mouth daily.    [provider]      Allergies    Shrimp [shellfish allergy], Bactroban [mupirocin], Vicodin [hydrocodone-acetaminophen], Eliquis [apixaban], Lisinopril, and Tylenol [acetaminophen]     Review of Systems   Review of Systems  Respiratory:  Positive for shortness of breath.   Cardiovascular:  Positive for chest pain.   Physical Exam Updated Vital Signs BP (!) 235/114    Pulse 87    Temp 98.5 F (36.9 C) (Oral)    Resp (!) 25    Ht $R'5\' 3"'MF$  (1.6 m)    Wt 46 kg    SpO2 99%    BMI 17.96 kg/m  Physical Exam Vitals and nursing note reviewed.  Constitutional:      General: She is not in acute distress.    Appearance: She is not ill-appearing.  HENT:     Head: Atraumatic.  Eyes:     Conjunctiva/sclera: Conjunctivae normal.  Cardiovascular:     Rate and Rhythm: Normal rate and regular rhythm.     Pulses: Normal pulses.          Radial pulses are 2+ on the right side and 2+ on the left side.       Dorsalis pedis pulses are 2+ on the right side and 2+ on the left side.     Heart sounds: No murmur heard. Pulmonary:     Effort: Pulmonary effort is normal. Tachypnea present. No respiratory distress.     Breath sounds: Normal breath sounds.  Chest:       Comments: Patient with well-healed surgical scar of midline chest secondary to open heart surgery 3 years ago. Abdominal:     General: Abdomen is flat. There is no distension.     Palpations: Abdomen is soft.     Tenderness: There is no abdominal tenderness.  Musculoskeletal:        General: Normal range of motion.     Cervical back: Normal range of motion.  Skin:    General: Skin is warm and dry.     Capillary Refill: Capillary refill takes less than 2 seconds.  Neurological:     General: No focal deficit present.     Mental Status: She is alert.  Psychiatric:        Mood and Affect: Mood normal.    ED Results / Procedures / Treatments   Labs (all labs ordered are listed, but only abnormal results are displayed) Labs Reviewed  COMPREHENSIVE METABOLIC PANEL - Abnormal; Notable for the following components:      Result Value   Glucose, Bld 104 (*)    BUN 48 (*)    Creatinine, Ser 9.39 (*)    GFR, Estimated  4 (*)    All other components within normal limits  BRAIN NATRIURETIC PEPTIDE - Abnormal; Notable for the following components:   B Natriuretic Peptide >4,500.0 (*)    All other components within normal limits  CBC - Abnormal; Notable for the following components:   RDW 18.6 (*)    All other components within normal limits  TROPONIN I (  HIGH SENSITIVITY) - Abnormal; Notable for the following components:   Troponin I (High Sensitivity) 236 (*)    All other components within normal limits  RESP PANEL BY RT-PCR (FLU A&B, COVID) ARPGX2  LIPASE, BLOOD  TROPONIN I (HIGH SENSITIVITY)    EKG EKG Interpretation  Date/Time:  Tuesday July 15 2021 20:23:22 EST Ventricular Rate:  91 PR Interval:  157 QRS Duration: 89 QT Interval:  381 QTC Calculation: 469 R Axis:   -37 Text Interpretation: Sinus rhythm Left atrial enlargement Abnormal R-wave progression, late transition Left ventricular hypertrophy Nonspecific T abnormalities, lateral leads ST elevation, consider inferior injury Confirmed by Davonna Belling [phone]) on 07/15/2021 9:03:27 PM  Radiology DG Chest 2 View  Result Date: 07/15/2021 CLINICAL DATA:  Chest pain, hypertension, shortness of breath EXAM: CHEST - 2 VIEW COMPARISON:  06/19/2021 FINDINGS: Right dialysis catheter remains in place, unchanged. Prior median sternotomy. Cardiomegaly. Mild vascular congestion. Continued mild interstitial prominence in the lower lung fields. This could reflect chronic interstitial lung disease or interstitial edema. No effusions. No acute bony abnormality. IMPRESSION: Cardiomegaly with vascular congestion. Lower lung interstitial prominence could reflect chronic lung disease or interstitial edema. Electronically Signed   By: Rolm Baptise M.D.   On: 07/15/2021 21:08    Procedures Procedures    Medications Ordered in ED Medications  fentaNYL (SUBLIMAZE) injection 50 mcg (50 mcg Intravenous Given 07/15/21 2212)    ED Course/ Medical Decision  Making/ A&P                           Medical Decision Making Amount and/or Complexity of Data Reviewed Labs: ordered. Radiology: ordered.  Risk Prescription drug management.   History:  Per HPI  Initial impression:  This patient presents to the ED for concern of chest pain and sob, this involves an extensive number of treatment options, and is a complaint that carries with it a high risk of complications and morbidity.   The emergent differential diagnosis of chest pain includes: Acute coronary syndrome, pericarditis, aortic dissection, pulmonary embolism, tension pneumothorax, and esophageal rupture. I do not believe the patient has an emergent cause of chest pain, other urgent/non-acute considerations include, but are not limited to: chronic angina, aortic stenosis, cardiomyopathy, myocarditis, mitral valve prolapse, pulmonary hypertension, hypertrophic obstructive cardiomyopathy (HOCM), aortic insufficiency, right ventricular hypertrophy, pneumonia, pleuritis, bronchitis, pneumothorax, tumor, gastroesophageal reflux disease (GERD), esophageal spasm, Mallory-Weiss syndrome, peptic ulcer disease, biliary disease, pancreatitis, functional gastrointestinal pain, cervical or thoracic disk disease or arthritis, shoulder arthritis, costochondritis, subacromial bursitis, anxiety or panic attack, herpes zoster, breast disorders, chest wall tumors, thoracic outlet syndrome, mediastinitis. pain  ED Course: Patient is in no acute distress, nontoxic-appearing.  Her blood pressure is significant elevated, however on chart review this appears to be around her baseline.  She is slightly tachypneic although pulmonary exam was without adventitious sounds.  No accessory muscle use.  She does have some reproducible chest wall tenderness of the mid sternum.  Chest x-ray with cardiomegaly with interstitial changes suspicious for interstitial lung disesae or edema. These findings were both noted on her previous  chest x-ray January 2023. Her troponin was elevated at 236, however this appears at her baseline.  Additionally her BNP was greater than 4000 which is also at her baseline.  Lipase was normal.  Respiratory panel was negative.  Patient was given 50 mcg fentanyl for pain with resolution. I Ordered, reviewed, and interpreted labs and EKG.  I independently visualized and interpreted imaging  and I agree with the radiologist interpretation.    Cardiac Monitoring:  The patient was maintained on a cardiac monitor.  I personally viewed and interpreted the cardiac monitored which showed an underlying rhythm of: NSR   Medicines ordered and prescription drug management:  I ordered medication including: Fentanyl 50 mcg for pain Reevaluation of the patient after these medicines showed that the patient resolved I have reviewed the patients home medicines and have made adjustments as needed  Disposition:  After consideration of the diagnostic results, physical exam, history and the patients response to treatment feel that the patent would benefit from discharge with strict return precautions.   Costochondritis: Given patient's unremarkable and overall reassuring work-up, patient does not need to be admitted at this time.  I considered obtaining a D-dimer or CTA to evaluate for blood clots, however patient's symptoms were not consistent with this diagnosis, furthermore she was nontachycardic and without hemoptysis. There are no new changes to her cardiac enzymes or chest x-ray.  Additionally, her pain is midsternal and reproducible with palpation.  I suspect her symptoms are likely due to costochondritis.  Given her ESRD, she is unable to manage this with typical NSAIDs.  She was given fentanyl here with resolution of pain.  Return precautions were discussed.  All questions were asked and answered.  Of note, patient is intermittently compliant with her ESRD which may impact her health and care.   Final  Clinical Impression(s) / ED Diagnoses Final diagnoses:  Costochondritis    Rx / DC Orders ED Discharge Orders     None         Rodena Piety 07/15/21 2236    Davonna Belling, MD 07/15/21 2349

## 2021-07-15 NOTE — ED Notes (Signed)
Lab called with critical value: Troponin 236. PA notified.

## 2021-07-15 NOTE — Discharge Instructions (Signed)
I suspect your pain is due to something called costochondritis.  This is inflammation of the cartilage in between your ribs.  Usually this is managed at home with NSAIDs, however since you are unable to take this given your ESRD, I recommend that you rest, stretch your chest and apply ice packs as needed.  You can also take Tylenol as needed for your pain.  You can follow-up with your primary care doctor in 4 to 6 weeks.  Return if your chest pain worsens or you have difficulty breathing.

## 2021-07-16 ENCOUNTER — Ambulatory Visit: Payer: Medicare Other | Admitting: Cardiology

## 2021-07-16 NOTE — Progress Notes (Unsigned)
Patient referred by Monique Neither, PA-C for chest pain  Subjective:   Monique Henderson, female    DOB: [birth year], 71 y.o.   MRN: [id number]  No chief complaint on file.   71 y/o [place] American female with h/o type A aortic dissection s/p repair (04/2018), former smoker,paroxysmal atrial fibrillation, h/o stroke (05/2019), moderate b/l carotid artery stenosis, ESRD on HD, hypertension,h/o breast cancer with chest pain.   ***  Current Outpatient Medications on File Prior to Visit  Medication Sig Dispense Refill   albuterol (VENTOLIN HFA) 108 (90 Base) MCG/ACT inhaler Inhale 2 puffs into the lungs every 6 (six) hours as needed for wheezing or shortness of breath. 8.5 g 0   amLODipine (NORVASC) 10 MG tablet Take 1 tablet (10 mg total) by mouth daily. 90 tablet 0   apixaban (ELIQUIS) 2.5 MG TABS tablet Take 1 tablet (2.5 mg total) by mouth 2 (two) times daily. 60 tablet 0   cloNIDine (CATAPRES - DOSED IN MG/24 HR) 0.1 mg/24hr patch Place 1 patch (0.1 mg total) onto the skin every Saturday. 4 patch 12   cyclobenzaprine (FLEXERIL) 5 MG tablet Take 5 mg by mouth 2 (two) times daily as needed for muscle spasms.     hydrALAZINE (APRESOLINE) 50 MG tablet Take 1 tablet (50 mg total) by mouth 3 (three) times daily. 90 tablet 0   isosorbide dinitrate (ISORDIL) 40 MG tablet Take 1 tablet (40 mg total) by mouth 3 (three) times daily. 90 tablet 0   loperamide (IMODIUM) 2 MG capsule Take 2 mg by mouth as needed for diarrhea or loose stools.     losartan (COZAAR) 100 MG tablet Take 1 tablet (100 mg total) by mouth daily. 90 tablet 0   mirtazapine (REMERON) 7.5 MG tablet Take 1 tablet (7.5 mg total) by mouth at bedtime. (Patient taking differently: Take 7.5 mg by mouth at bedtime as needed (sleep).) 90 tablet 3   NARCAN 4 MG/0.1ML LIQD nasal spray kit Place 1 spray into the nose as needed for opioid reversal. If no response within 3 minutes then repeat once in the other nostril.  Call 911.      ondansetron (ZOFRAN) 4 MG tablet Take 1 tablet (4 mg total) by mouth every 8 (eight) hours as needed for nausea or vomiting. 20 tablet 3   pregabalin (LYRICA) 50 MG capsule Take 100 mg by mouth at bedtime.     rosuvastatin (CRESTOR) 10 MG tablet Take 1 tablet (10 mg total) by mouth daily. 90 tablet 0   sevelamer carbonate (RENVELA) 2.4 g PACK Take 2.4 g by mouth 3 (three) times daily with meals.     torsemide (DEMADEX) 20 MG tablet Take 40 mg by mouth daily.     VELPHORO 500 MG chewable tablet Chew 1,000 mg by mouth 3 (three) times daily.     VITAMIN D PO Take 1 tablet by mouth daily.     No current facility-administered medications on file prior to visit.    Cardiovascular and other pertinent studies: Nuclear stress test 06/21/2021: 1. No reversible ischemia.  Moderate apical scarring/infarct. 2.  Generalized hypokinesis, severe at the apex. 3. Left ventricular ejection fraction 41%. Moderately abnormal LV systolic volume. 4. Non invasive risk stratification*: Intermediate   Echocardiogram 06/28/2019:  Normal LV systolic function with EF 57%. Left ventricle cavity is normal  in size. Moderate concentric hypertrophy of the left ventricle. Normal  global wall motion. Doppler evidence of grade II (pseudonormal) diastolic  dysfunction, elevated LAP. Calculated  EF 57%.  Structurally normal aortic valve.  Mild (Grade I) aortic regurgitation.  Structurally normal mitral valve.  Mild (Grade I) mitral regurgitation.  Normal mitral valve leaflet mobility.  Structurally normal tricuspid valve.  Moderate to severe tricuspid  regurgitation directed towards septum. Mild pulmonary hypertension. RVSP  measures 34 mmHg.  No evidence of significant pericardial effusion. Moderate left pleural  Effusion.  EKG 06/08/2019: Sinus rhythm 73 bpm. Left atrial enlargement.  CTA chest 06/08/2019: 1. Evidence of prior median sternotomy. 2. Persistent moderate severity consolidation throughout the left lower  lobe with near complete collapse. 3. Stable small to moderate sized left pleural effusion. 4. No evidence of aortic aneurysm or dissection. 5. Postoperative changes at the level of L2-L3.   Echocardiogram 05/12/2019: Mild LVH. LVEF 65-70%. No WMA. Grade II diastolic dysfunction. Mild RA dilatation. Severe tricuspid regurgitation. Estimated PASP 44 mmHg.    Recent labs:  ***/***/202***: Glucose ***, BUN/Cr ***/***. EGFR ***. Na/K ***/***. ***Rest of the CMP normal H/H ***/***. MCV ***. Platelets *** ***HbA1C ***% Chol ***, TG ***, HDL ***, LDL *** ***TSH ***normal  06/08/2019: Glucose 110, BUN/Cr 11/2.13. EGFR 27. Na/K 132/4.2. Albumin 2.2 (05/2019) H/H 10.7/35.8. MCV 88. Platelets 449 HbA1C 6.0% (04/2019) Chol 124, TG 132, HDL 42, LDL 56 (05/2019)  Results for Monique, Henderson (MRN [id number]) as of 06/13/2019 21:19  Ref. Range 06/01/2019 00:43 06/01/2019 02:43 06/08/2019 13:51 06/08/2019 16:49  B Natriuretic Peptide Latest Ref Range: 0.0 - 100.0 pg/mL 732.8 (H)     Troponin I (High Sensitivity) Latest Ref Range: <18 ng/L 66 (H) 66 (H) 31 (H) 32 (H)     Review of Systems  Cardiovascular:  Negative for chest pain, dyspnea on exertion, leg swelling, palpitations and syncope.      No vitals available.  There is no height or weight on file to calculate BMI. There were no vitals filed for this visit.    Objective:   Physical ExamNot performed.  Telephone visit.      Assessment & Recommendations:   71 y/o [place] American female with h/o type A aortic dissection s/p repair (04/2018), former smoker,paroxysmal atrial fibrillation, h/o stroke (05/2019), moderate b/l carotid artery stenosis, ESRD on HD, hypertension,h/o breast cancer with chest pain.   Chest pain: Atypical, now resolved.  Low risk stress test.  Given her recent stroke and aortic atherosclerosis, recommend continuing statin. In absence of clear bleeding risk, reasonable to continue aspirin.  PAF:  CHA2DS2VASc  score 6, annual stroke risk 9%. On warfarin, managed by PCP.  Given her recent stroke, do not recommend interruption of anticoagulation.  Consider postponing vascular surgery to 3 months later.  Tricuspid regurgitation: Severe. Clinically asymptomatic.   I will see her on as-needed basis.  Nigel Mormon, MD [facility] Pager: [phone] Office: [phone]

## 2021-08-12 ENCOUNTER — Inpatient Hospital Stay (HOSPITAL_COMMUNITY): Payer: Medicare Other

## 2021-08-12 ENCOUNTER — Encounter (HOSPITAL_COMMUNITY): Payer: Self-pay | Admitting: Emergency Medicine

## 2021-08-12 ENCOUNTER — Inpatient Hospital Stay (HOSPITAL_COMMUNITY)
Admission: EM | Admit: 2021-08-12 | Discharge: 2021-08-16 | DRG: 917 | Disposition: A | Discharge status: Skilled Nursing Facility | Payer: Medicare Other | Attending: Family Medicine | Admitting: Family Medicine

## 2021-08-12 ENCOUNTER — Emergency Department (HOSPITAL_COMMUNITY): Payer: Medicare Other

## 2021-08-12 ENCOUNTER — Other Ambulatory Visit: Payer: Self-pay

## 2021-08-12 DIAGNOSIS — I480 Paroxysmal atrial fibrillation: Secondary | ICD-10-CM | POA: Diagnosis present

## 2021-08-12 DIAGNOSIS — I472 Ventricular tachycardia, unspecified: Secondary | ICD-10-CM | POA: Diagnosis not present

## 2021-08-12 DIAGNOSIS — Z833 Family history of diabetes mellitus: Secondary | ICD-10-CM

## 2021-08-12 DIAGNOSIS — I169 Hypertensive crisis, unspecified: Secondary | ICD-10-CM | POA: Diagnosis not present

## 2021-08-12 DIAGNOSIS — Z91013 Allergy to seafood: Secondary | ICD-10-CM

## 2021-08-12 DIAGNOSIS — D631 Anemia in chronic kidney disease: Secondary | ICD-10-CM | POA: Diagnosis present

## 2021-08-12 DIAGNOSIS — I10 Essential (primary) hypertension: Secondary | ICD-10-CM

## 2021-08-12 DIAGNOSIS — Z992 Dependence on renal dialysis: Secondary | ICD-10-CM | POA: Diagnosis not present

## 2021-08-12 DIAGNOSIS — K219 Gastro-esophageal reflux disease without esophagitis: Secondary | ICD-10-CM | POA: Diagnosis present

## 2021-08-12 DIAGNOSIS — R64 Cachexia: Secondary | ICD-10-CM | POA: Diagnosis present

## 2021-08-12 DIAGNOSIS — R45851 Suicidal ideations: Secondary | ICD-10-CM | POA: Diagnosis present

## 2021-08-12 DIAGNOSIS — I120 Hypertensive chronic kidney disease with stage 5 chronic kidney disease or end stage renal disease: Secondary | ICD-10-CM | POA: Diagnosis present

## 2021-08-12 DIAGNOSIS — N2889 Other specified disorders of kidney and ureter: Secondary | ICD-10-CM | POA: Diagnosis not present

## 2021-08-12 DIAGNOSIS — Z800 Family history of malignant neoplasm of digestive organs: Secondary | ICD-10-CM

## 2021-08-12 DIAGNOSIS — Z820 Family history of epilepsy and other diseases of the nervous system: Secondary | ICD-10-CM

## 2021-08-12 DIAGNOSIS — Z681 Body mass index (BMI) 19 or less, adult: Secondary | ICD-10-CM

## 2021-08-12 DIAGNOSIS — Z9115 Patient's noncompliance with renal dialysis: Secondary | ICD-10-CM

## 2021-08-12 DIAGNOSIS — Z981 Arthrodesis status: Secondary | ICD-10-CM

## 2021-08-12 DIAGNOSIS — F32A Depression, unspecified: Secondary | ICD-10-CM | POA: Diagnosis present

## 2021-08-12 DIAGNOSIS — E875 Hyperkalemia: Secondary | ICD-10-CM | POA: Diagnosis present

## 2021-08-12 DIAGNOSIS — F17210 Nicotine dependence, cigarettes, uncomplicated: Secondary | ICD-10-CM | POA: Diagnosis present

## 2021-08-12 DIAGNOSIS — T405X1A Poisoning by cocaine, accidental (unintentional), initial encounter: Secondary | ICD-10-CM | POA: Diagnosis present

## 2021-08-12 DIAGNOSIS — M898X9 Other specified disorders of bone, unspecified site: Secondary | ICD-10-CM | POA: Diagnosis present

## 2021-08-12 DIAGNOSIS — I471 Supraventricular tachycardia: Secondary | ICD-10-CM | POA: Diagnosis not present

## 2021-08-12 DIAGNOSIS — E7800 Pure hypercholesterolemia, unspecified: Secondary | ICD-10-CM | POA: Diagnosis present

## 2021-08-12 DIAGNOSIS — J449 Chronic obstructive pulmonary disease, unspecified: Secondary | ICD-10-CM | POA: Diagnosis present

## 2021-08-12 DIAGNOSIS — N186 End stage renal disease: Secondary | ICD-10-CM | POA: Diagnosis present

## 2021-08-12 DIAGNOSIS — I959 Hypotension, unspecified: Secondary | ICD-10-CM | POA: Diagnosis present

## 2021-08-12 DIAGNOSIS — G928 Other toxic encephalopathy: Secondary | ICD-10-CM | POA: Diagnosis present

## 2021-08-12 DIAGNOSIS — R4182 Altered mental status, unspecified: Secondary | ICD-10-CM | POA: Diagnosis not present

## 2021-08-12 DIAGNOSIS — N2581 Secondary hyperparathyroidism of renal origin: Secondary | ICD-10-CM | POA: Diagnosis present

## 2021-08-12 DIAGNOSIS — Z886 Allergy status to analgesic agent status: Secondary | ICD-10-CM

## 2021-08-12 DIAGNOSIS — Z7901 Long term (current) use of anticoagulants: Secondary | ICD-10-CM | POA: Diagnosis not present

## 2021-08-12 DIAGNOSIS — I151 Hypertension secondary to other renal disorders: Secondary | ICD-10-CM

## 2021-08-12 DIAGNOSIS — N19 Unspecified kidney failure: Secondary | ICD-10-CM

## 2021-08-12 DIAGNOSIS — Z79899 Other long term (current) drug therapy: Secondary | ICD-10-CM

## 2021-08-12 DIAGNOSIS — Z20822 Contact with and (suspected) exposure to covid-19: Secondary | ICD-10-CM | POA: Diagnosis present

## 2021-08-12 DIAGNOSIS — Z888 Allergy status to other drugs, medicaments and biological substances status: Secondary | ICD-10-CM

## 2021-08-12 DIAGNOSIS — Z8249 Family history of ischemic heart disease and other diseases of the circulatory system: Secondary | ICD-10-CM

## 2021-08-12 DIAGNOSIS — Z9114 Patient's other noncompliance with medication regimen: Secondary | ICD-10-CM

## 2021-08-12 DIAGNOSIS — G9341 Metabolic encephalopathy: Secondary | ICD-10-CM | POA: Diagnosis present

## 2021-08-12 DIAGNOSIS — Z881 Allergy status to other antibiotic agents status: Secondary | ICD-10-CM

## 2021-08-12 LAB — BASIC METABOLIC PANEL
Anion gap: 19 — ABNORMAL HIGH (ref 5–15)
Anion gap: 23 — ABNORMAL HIGH (ref 5–15)
BUN: 155 mg/dL — ABNORMAL HIGH (ref 8–23)
BUN: 150 mg/dL — ABNORMAL HIGH (ref 8–23)
CO2: 21 mmol/L — ABNORMAL LOW (ref 22–32)
CO2: 21 mmol/L — ABNORMAL LOW (ref 22–32)
Calcium: 9.3 mg/dL (ref 8.9–10.3)
Calcium: 9 mg/dL (ref 8.9–10.3)
Chloride: 100 mmol/L (ref 98–111)
Chloride: 98 mmol/L (ref 98–111)
Creatinine, Ser: 19.69 mg/dL — ABNORMAL HIGH (ref 0.44–1.00)
Creatinine, Ser: 19.09 mg/dL — ABNORMAL HIGH (ref 0.44–1.00)
GFR, Estimated: 2 mL/min — ABNORMAL LOW (ref >60)
GFR, Estimated: 2 mL/min — ABNORMAL LOW (ref >60)
Glucose, Bld: 116 mg/dL — ABNORMAL HIGH (ref 70–99)
Glucose, Bld: 182 mg/dL — ABNORMAL HIGH (ref 70–99)
Potassium: 6.7 mmol/L (ref 3.5–5.1)
Potassium: 5.2 mmol/L — ABNORMAL HIGH (ref 3.5–5.1)
Sodium: 140 mmol/L (ref 135–145)
Sodium: 142 mmol/L (ref 135–145)

## 2021-08-12 LAB — URINALYSIS, ROUTINE W REFLEX MICROSCOPIC
Bilirubin Urine: NEGATIVE
Glucose, UA: 50 mg/dL — AB
Ketones, ur: NEGATIVE mg/dL
Nitrite: NEGATIVE
Protein, ur: >=300 mg/dL — AB
Specific Gravity, Urine: 1.012 (ref 1.005–1.030)
WBC, UA: >50 WBC/hpf — ABNORMAL HIGH (ref 0–5)
pH: 8 (ref 5.0–8.0)

## 2021-08-12 LAB — CBC
HCT: 34.4 % — ABNORMAL LOW (ref 36.0–46.0)
Hemoglobin: 11.1 g/dL — ABNORMAL LOW (ref 12.0–15.0)
MCH: 30.5 pg (ref 26.0–34.0)
MCHC: 32.3 g/dL (ref 30.0–36.0)
MCV: 94.5 fL (ref 80.0–100.0)
Platelets: 240 10*3/uL (ref 150–400)
RBC: 3.64 MIL/uL — ABNORMAL LOW (ref 3.87–5.11)
RDW: 16.3 % — ABNORMAL HIGH (ref 11.5–15.5)
WBC: 4.9 10*3/uL (ref 4.0–10.5)
nRBC: 0 % (ref 0.0–0.2)

## 2021-08-12 LAB — RESP PANEL BY RT-PCR (FLU A&B, COVID) ARPGX2
Influenza A by PCR: NEGATIVE
Influenza B by PCR: NEGATIVE
SARS Coronavirus 2 by RT PCR: NEGATIVE

## 2021-08-12 LAB — RAPID URINE DRUG SCREEN, HOSP PERFORMED
Amphetamines: NOT DETECTED
Barbiturates: NOT DETECTED
Benzodiazepines: NOT DETECTED
Cocaine: POSITIVE — AB
Opiates: NOT DETECTED
Tetrahydrocannabinol: NOT DETECTED

## 2021-08-12 LAB — CBG MONITORING, ED: Glucose-Capillary: 115 mg/dL — ABNORMAL HIGH (ref 70–99)

## 2021-08-12 LAB — TROPONIN I (HIGH SENSITIVITY): Troponin I (High Sensitivity): 183 ng/L (ref <18)

## 2021-08-12 MED ORDER — ISOSORBIDE DINITRATE 20 MG PO TABS
40.0000 mg | ORAL_TABLET | Freq: Three times a day (TID) | ORAL | Status: DC
  Filled 2021-08-12: qty 2

## 2021-08-12 MED ORDER — INSULIN ASPART 100 UNIT/ML IV SOLN
5.0000 [IU] | Freq: Once | INTRAVENOUS | Status: AC
  Administered 2021-08-12: 5 [IU] via INTRAVENOUS

## 2021-08-12 MED ORDER — MIRTAZAPINE 15 MG PO TABS
7.5000 mg | ORAL_TABLET | Freq: Every day | ORAL | Status: DC
Start: 2021-08-12 — End: 2021-08-17
  Administered 2021-08-13 – 2021-08-15 (×3): 7.5 mg via ORAL
  Filled 2021-08-12 (×4): qty 1

## 2021-08-12 MED ORDER — FUROSEMIDE 10 MG/ML IJ SOLN
40.0000 mg | Freq: Once | INTRAMUSCULAR | Status: AC
  Administered 2021-08-12: 40 mg via INTRAVENOUS
  Filled 2021-08-12: qty 4

## 2021-08-12 MED ORDER — AMLODIPINE BESYLATE 5 MG PO TABS: 10.0000 mg | ORAL_TABLET | Freq: Every day | ORAL | Status: DC

## 2021-08-12 MED ORDER — HYDRALAZINE HCL 25 MG PO TABS: 50.0000 mg | ORAL_TABLET | Freq: Three times a day (TID) | ORAL | Status: DC

## 2021-08-12 MED ORDER — APIXABAN 2.5 MG PO TABS
2.5000 mg | ORAL_TABLET | Freq: Two times a day (BID) | ORAL | Status: DC
  Administered 2021-08-13 – 2021-08-16 (×7): 2.5 mg via ORAL
  Filled 2021-08-12 (×7): qty 1

## 2021-08-12 MED ORDER — SODIUM ZIRCONIUM CYCLOSILICATE 10 G PO PACK
10.0000 g | PACK | Freq: Every day | ORAL | Status: DC
  Administered 2021-08-12: 10 g via ORAL
  Filled 2021-08-12: qty 1

## 2021-08-12 MED ORDER — LOSARTAN POTASSIUM 50 MG PO TABS: 100.0000 mg | ORAL_TABLET | Freq: Every day | ORAL | Status: DC

## 2021-08-12 MED ORDER — ROSUVASTATIN CALCIUM 5 MG PO TABS
10.0000 mg | ORAL_TABLET | Freq: Every day | ORAL | Status: DC
  Administered 2021-08-13 – 2021-08-16 (×4): 10 mg via ORAL
  Filled 2021-08-12 (×4): qty 2

## 2021-08-12 MED ORDER — DOXERCALCIFEROL 4 MCG/2ML IV SOLN
4.0000 ug | INTRAVENOUS | Status: DC
  Administered 2021-08-14 – 2021-08-16 (×2): 4 ug via INTRAVENOUS
  Filled 2021-08-12 (×2): qty 2

## 2021-08-12 MED ORDER — DEXTROSE 50 % IV SOLN
1.0000 | Freq: Once | INTRAVENOUS | Status: AC
  Administered 2021-08-12: 50 mL via INTRAVENOUS
  Filled 2021-08-12: qty 50

## 2021-08-12 MED ORDER — ALBUTEROL SULFATE (2.5 MG/3ML) 0.083% IN NEBU
10.0000 mg | INHALATION_SOLUTION | Freq: Once | RESPIRATORY_TRACT | Status: AC
  Administered 2021-08-12: 10 mg via RESPIRATORY_TRACT
  Filled 2021-08-12: qty 12

## 2021-08-12 MED ORDER — ALBUTEROL SULFATE (2.5 MG/3ML) 0.083% IN NEBU: 2.5000 mg | INHALATION_SOLUTION | Freq: Four times a day (QID) | RESPIRATORY_TRACT | Status: DC | PRN

## 2021-08-12 MED ORDER — DARBEPOETIN ALFA 100 MCG/0.5ML IJ SOSY: 100.0000 ug | PREFILLED_SYRINGE | INTRAMUSCULAR | Status: DC

## 2021-08-12 MED ORDER — ALBUTEROL SULFATE HFA 108 (90 BASE) MCG/ACT IN AERS: 2.0000 | INHALATION_SPRAY | Freq: Four times a day (QID) | RESPIRATORY_TRACT | Status: DC | PRN

## 2021-08-12 MED ORDER — SODIUM CHLORIDE 0.9 % IV BOLUS
500.0000 mL | Freq: Once | INTRAVENOUS | Status: AC
  Administered 2021-08-12: 500 mL via INTRAVENOUS

## 2021-08-12 MED ORDER — TORSEMIDE 20 MG PO TABS
40.0000 mg | ORAL_TABLET | Freq: Every day | ORAL | Status: DC
Start: 2021-08-12 — End: 2021-08-12

## 2021-08-12 MED ORDER — CALCIUM GLUCONATE-NACL 1-0.675 GM/50ML-% IV SOLN
1.0000 g | Freq: Once | INTRAVENOUS | Status: AC
  Administered 2021-08-12: 1000 mg via INTRAVENOUS
  Filled 2021-08-12: qty 50

## 2021-08-12 MED ORDER — SODIUM BICARBONATE 8.4 % IV SOLN
50.0000 meq | Freq: Once | INTRAVENOUS | Status: AC
  Administered 2021-08-12: 50 meq via INTRAVENOUS
  Filled 2021-08-12: qty 50

## 2021-08-12 NOTE — Hospital Course (Addendum)
Monique Henderson is a 71 y.o. female presenting with 1 week of dizziness and missing HD for 1 week. 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 ?Uremic Encephalopathy  ESRD on HD TTS ?Pt presented after missing one week of dialysis d/t having "chest pain". Pt was very lethargic and a poor historian. Head CT showed no acute intracranial process. In ED pt had BUN 155, Cr 19.69, K 6.7. Pt received Lokelma 10 g, calcium gluconate 1 g, insulin aspart 5 U with 50% dextrose, sodium bicarb 50 mEq in ED for hyperkalemia. Pt was hypertensive to 204/128 in Ed. Nephrology was consulted and pt received dialysis on night of admission.  Patient resumed regularly scheduled HD while in hospital.  Patient was discharged after she received HD on Saturday, 3/18. ? ?Chest Pain ?Pt complained of right sided CP that moves to center of chest for past week. CXR showed cardia enlargement with pulmonary vascular congestion. EKG showed no changes from prior on 07/15/21 and no evidence of MI.  Trops trended down.  Denied any chest pain after first day of hospitalization. ? ?HTN ?Pt was hypertensive to 204/128 in ED. Patient was resumed on home medications as prescribed and her last hospitalization in January 2023.  BP continued to fluctuate and was in part related to her headaches and needing HD. Was recommended to follow up on outpatient.  ? ?Frontal Headache ?Patient complained about headaches during hospitalization which appeared to have improved after patient was switched after Isordil was held. At discharge she was switched to Imdur. ? ?PAF with RVR ?Patient has had a history of PAF with RVR.  Patient was placed on cardiac monitoring and did have an brief episode of 7 beats of SVT in the 160s. Discussed with cardiology who recommended checking potassium and magnesium which were both normal.  No further work up was recommended. Patient's home medication of warfarin was held because of patient non compliance.  It was deemed that patient would be safer on Eliquis which is what she was discharged on.  ? ?Other chronic conditions were monitored and home medications were continued including: Chronic anemia, COPD, hyperlipidemia ?

## 2021-08-12 NOTE — ED Triage Notes (Signed)
Potassium  6.7  ?

## 2021-08-12 NOTE — H&P (Addendum)
Family Medicine Teaching Service ?Hospital Admission History and Physical ?Service Pager: [phone] ? ?Patient name: Monique Henderson Medical record number: [id number] ?Date of birth: 24-Apr-1951 Age: 71 y.o. Gender: female ? ?Primary Care Provider: Myrtie Neither, PA-C ?Consultants: Nephrology ?Code Status: Full   ?Preferred Emergency Contact: Does not want an emergency contact on file  ? ?Chief Complaint: Dizziness ? ?Assessment and Plan: ?Monique Henderson is a 71 y.o. female presenting with 1 week of dizziness and missing HD for 1 week. 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 ?Uremic Encephalopathy ?ESRD on HD TTS ?Hyperkalemia ?Patient presents after missing 1 week of dialysis due to "chest pain".  Patient unable to characterize chest pain well or how this relates to her not attending HD and is a poor historian as well as very lethargic. BUN 155, creatinine 19.69. Potassium 6.7. ECG shows no changes from 2/14 ECG. CXR shows cardiac enlargement with pulmonary vascular congestion. Patient received Lokelma 10 g, calcium gluconate 1 g, insulin aspart 5 units with dextrose 50% solution 1 ampoule, sodium bicarb 50 mEq in the ED to address hyperkalemia. EDP consulted nephro for HD. UA shows greater than 300 protein, large leukocytes, greater than 50 WBC, few bacteria.  Given patient has missed 1 week of HD, I suspect uremic encephalopathy.  Patient's UA does appear abnormal with large leukocytes.  We will get a urine culture to assess for metabolic encephalopathy secondary to UTI.  This appears to be less likely given she is afebrile, and VSS. Patient does endorse taking a "pill" from a friend yesterday that she suspects was fentanyl but she is unclear whether this is the cause for her hospitalization.  We will follow-up with UDS although it will not be positive if  the pill was indeed fentanyl.  Patient adamantly denies that she takes illicit substances but does not have a good reason why she decided to take a "fentanyl pill". ?-Admit to telemed, attending Dr. Thompson Grayer ?- nephrology consulted, greatly appreciate assistance. Plan for HD at least starting today. ?- follow up CT head  ?- UDS  ?-Cardiac monitoring x 24 hours ?-Vital signs per floor ?-monitor mental status ?- PT/OT ?- will repeat K+ ?- bedside swallow study ?- npo  ? ?Chest pain ?Patient endorses right sided pain that moves to the center of her chest for the past week. Unable to stay awake to give additional history. Does have a history of aortic dissection, will monitor vitals closely ?- cardiac monitoring  ?- trend Trop  ? ?Medication noncompliance ?Blood pressure 204/128 in ED.  Has downtrended to 160/118.  Home med list: Amlodipine 10, hydralazine 50 mg 3 times daily, Isordil 40 mg 3 times daily, Cozaar 100 mg daily, clonidine 0.1 mg patch q Saturday, Demadex 40 mg daily ?Will av ?- Hold BP meds until CT returns  ? ?Chronic Anemia ?Likely secondary to ESRD. Hgb 11.1 ?-Monitor trend and can get ESA per nephro ? ?COPD ?Home med: albuterol q6 hour PRN ?-Albuterol available PRN ? ?HLD ?Home med Crestor 10 mg ?-Resume home med ? ?PAF with RVR ?Home med: Eliquis 2.5 mg bid ?-Resume home med (allergy states itching but she has been on eliquis recently without complaints) ? ?FEN/GI: Renal with fluid restriction.  We will adjust after HD session ?Prophylaxis: eliquis ? ?Disposition: med-surg  ? ?History of Present Illness:  Monique Henderson is a 71 y.o. female  presenting with altered mental status after missing 1 week of dialysis as well as dizziness and chest pain. ? ?Patient reports chest pain that started last Tuesday. States she had a sharp pain toward the right side of her chest that radiated to center of chest. Patient stops in the middle of sentences and is not able to give good history.  Patient appears very  lethargic and unable to complete sentences without dozing off. Last dialysis session a week ago, states she missed her sessions because she was sick from her chest pain. ? ?She believes she took a pill from a friend yesterday and then she got dizzy and the room was spinning. Unable to give much details about this encounter or subsequent events. ? ?States she smokes about 1 pack every 4 days. Denies alcohol, drug use.  ? ?Patient state she lives alone and doesn't have anyone who helps her. ? ?Alert and oriented to name and location, not to year (1933) or president (Obama).  ? ?Review Of Systems: Per HPI with the following additions:  ? ?Review of Systems  ?Constitutional:  Positive for fatigue. Negative for fever.  ?Respiratory:  Positive for cough and shortness of breath.   ?Cardiovascular:  Positive for chest pain. Negative for palpitations and leg swelling.  ?Gastrointestinal:  Negative for abdominal distention, abdominal pain, constipation, diarrhea, nausea and vomiting.  ?Neurological:  Positive for dizziness. Negative for numbness and headaches.  ?Psychiatric/Behavioral:  Positive for confusion.    ? ?Patient Active Problem List  ? Diagnosis Date Noted  ? Malignant hypertension 06/19/2021  ? Anemia 06/05/2021  ? Pure hypercholesterolemia 02/14/2021  ? Acute pulmonary edema ([place]) 12/23/2020  ? Acute respiratory failure with hypoxia ([place]) 09/21/2020  ? Acute renal failure superimposed on chronic kidney disease ([place]) 09/21/2020  ? Community acquired pneumonia 09/21/2020  ? Uremia 09/21/2020  ? Metabolic acidosis 97/41/6384  ? Hypertensive urgency 09/21/2020  ? On Coumadin for atrial fibrillation ([place]) 10/03/2019  ? Troponin level elevated 08/28/2019  ? Positive D dimer 08/28/2019  ? Hyperkalemia 08/28/2019  ? SVT (supraventricular tachycardia) ([place]) 08/28/2019  ? SIRS (systemic inflammatory response syndrome) ([place]) 08/27/2019  ? Paroxysmal SVT (supraventricular tachycardia) ([place]) 08/27/2019  ? Aortic  atherosclerosis ([place]) 07/05/2019  ? Chest pain 07/05/2019  ? History of CVA (cerebrovascular accident) 05/29/2019  ? AF (paroxysmal atrial fibrillation) ([place]) 05/29/2019  ? ESRD on dialysis [facility]) 05/29/2019  ? Cerebral thrombosis with cerebral infarction 05/22/2019  ? Malnutrition of moderate degree 05/19/2019  ? Pleural effusion 05/18/2019  ? Atrial fibrillation with RVR ([place]) 05/18/2019  ? S/P aortic aneurysm repair 04/07/2019  ? Aortic dissection ([place]) 04/04/2019  ? Dissection of aorta ([place]) 04/03/2019  ? Non compliance with medical treatment 12/04/2017  ? CKD (chronic kidney disease), stage IV ([place]) 07/19/2017  ? Chronic obstructive lung disease ([place]) 01/16/2017  ? Chronic low back pain 06/22/2016  ? Insomnia 03/14/2015  ? S/P lumbar spinal fusion 01/18/2014  ? Tobacco use disorder 04/19/2009  ? Essential hypertension 09/16/2006  ? GERD 09/16/2006  ? ? ?Past Medical History: ?Past Medical History:  ?Diagnosis Date  ? AF (paroxysmal atrial fibrillation) ([place]) 05/29/2019  ? on Coumadin  ? Aortic atherosclerosis ([place]) 07/05/2019  ? Aortic dissection ([place]) 04/04/2019  ? s/p repair  ? Bone spur 2008  ? Right calcaneal foot spur  ? Breast cancer ([place]) 2004  ? Ductal carcinoma in situ of the left breast; S/P left partial mastectomy 02/26/2003; S/P re-excision of cranial and lateral margins11/18/2004.radiation  ? Cerebral thrombosis with cerebral  infarction 05/22/2019  ? Chronic low back pain 06/22/2016  ? Chronic obstructive lung disease ([place]) 01/16/2017  ? 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 S/P breast lumpectomy 10/13/2012 by Dr. Autumn Messing; S/P re-excision of superior and inferior margins 10/27/2012.   ? ESRD on dialysis [facility]) 05/29/2019  ? Essential hypertension 09/16/2006  ? GERD 09/16/2006  ? Hepatitis C   ? treated and RNA confirmed not detectable 01/2017  ? Insomnia 03/14/2015  ? Malnutrition of moderate degree 05/19/2019  ? Non compliance with medical treatment 12/04/2017  ? Normocytic  anemia   ? With thrombocytosis  ? Osteoarthritis   ? Right ureteral stone 2002  ? S/P lumbar spinal fusion 01/18/2014  ? S/P lumbar decompressive laminectomy, fusion, and plating for lumbar spinal stenosis on 05/27/2009 by

## 2021-08-12 NOTE — ED Triage Notes (Signed)
Pt here via EMS from home with c/o dizziness X1 week. Pt states she took a pill (unknown type)  last Friday from a "friend". Pt thinks it was fentanyl. Since then she has been dizzy and not felt good. Missed HD for 1 week. Last treatment 08/05/21 ? ? ? ?178/108-did not take her meds ?Hr 74 ?RR 18 ?96% Ra ?260 CBG ?

## 2021-08-12 NOTE — ED Notes (Signed)
;  Renal pt, Pt states she has trouble with on demand void ? ?

## 2021-08-12 NOTE — Consult Note (Signed)
[facility] ?Renal Consultation Note ? ?Indication for Consultation:  Management of ESRD/hemodialysis; anemia, hypertension/volume and secondary hyperparathyroidism ? ?HPI: Monique Henderson is a 71 y.o. female.  With ESRD   HD dep (HD TTS [facility] )since 04/06/19 after aortic dissection and repair-, P A-fib also COPD tobacco disorder,, hypertension, GERD, and hepatitis C. ?Now presents to the ER complaining of hurting all over and short of breath.  She has missed her last 3 outpatient dialysis tells me he was hurting all over, last dialysis 08/02/21.  Denies fever chills chest pain nausea vomiting did report some dizziness gave history to ER RN took a pill of unknown type Friday from friend who thought it was fentanyl since then was dizzy and did not feel good ? ?Noted ER labs K6.7, BUN 155 creatinine 19.7 CO2 21 Hgb 11.1 WBC 4.6 ?Initial BP 204/128, afebrile, O2 sat 98% on room air ? ? ?  ?Past Medical History:  ?Diagnosis Date  ? AF (paroxysmal atrial fibrillation) ([place]) 05/29/2019  ? on Coumadin  ? Aortic atherosclerosis ([place]) 07/05/2019  ? Aortic dissection ([place]) 04/04/2019  ? s/p repair  ? Bone spur 2008  ? Right calcaneal foot spur  ? Breast cancer ([place]) 2004  ? Ductal carcinoma in situ of the left breast; S/P left partial mastectomy 02/26/2003; S/P re-excision of cranial and lateral margins11/18/2004.radiation  ? Cerebral thrombosis with cerebral infarction 05/22/2019  ? Chronic low back pain 06/22/2016  ? Chronic obstructive lung disease ([place]) 01/16/2017  ? 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 S/P breast lumpectomy 10/13/2012 by Dr. Autumn Messing; S/P re-excision of superior and inferior margins 10/27/2012.   ? ESRD on dialysis [facility]) 05/29/2019  ? Essential hypertension 09/16/2006  ? GERD 09/16/2006  ? Hepatitis C   ? treated and RNA confirmed not detectable 01/2017  ? Insomnia 03/14/2015  ? Malnutrition of moderate degree 05/19/2019  ? Non compliance with medical  treatment 12/04/2017  ? Normocytic anemia   ? With thrombocytosis  ? Osteoarthritis   ? Right ureteral stone 2002  ? S/P lumbar spinal fusion 01/18/2014  ? S/P lumbar decompressive laminectomy, fusion, and plating for lumbar spinal stenosis on 05/27/2009 by Dr. Eustace Moore.  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 Eustace Moore.    ? Tobacco use disorder 04/19/2009  ? Uterine fibroid   ? Wears dentures   ? top  ? ? ?Past Surgical History:  ?Procedure Laterality Date  ? ANTERIOR LAT LUMBAR FUSION N/A 01/18/2014  ? Procedure: ANTERIOR LATERAL LUMBAR FUSION LUMBAR TWO-THREE;  Surgeon: Eustace Moore, MD;  Location: [facility];  Service: Neurosurgery;  Laterality: N/A;  ? ANTERIOR LUMBAR FUSION  01/18/2014  ? AV FISTULA PLACEMENT Left 04/20/2019  ? Procedure: ARTERIOVENOUS (AV) FISTULA CREATION;  Surgeon: [name], MD;  Location: [facility];  Service: Vascular;  Laterality: Left;  ? BACK SURGERY    ? BREAST LUMPECTOMY Left 01/2003  ? BREAST LUMPECTOMY Right 2014  ? BREAST LUMPECTOMY WITH NEEDLE LOCALIZATION AND AXILLARY SENTINEL LYMPH NODE BX Right 10/13/2012  ? Procedure: BREAST LUMPECTOMY WITH NEEDLE LOCALIZATION;  Surgeon: [name], MD;  Location: [facility];  Service: General;  Laterality: Right;  Right breast wire localized lumpectomy  ? INSERTION OF DIALYSIS CATHETER Right 04/20/2019  ? Procedure:  INSERTION OF DIALYSIS CATHETER, right internal jugular;  Surgeon: [name], MD;  Location: [facility];  Service: Vascular;  Laterality: Right;  ? INSERTION OF DIALYSIS CATHETER Right 09/24/2020  ? Procedure: INSERTION OF TUNNELED DIALYSIS CATHETER;  Surgeon: Rosetta Posner, MD;  [facility];  Service: Vascular;  Laterality: Right;  ? IR FLUORO GUIDE CV LINE RIGHT  09/22/2020  ? IR  THORACENTESIS ASP PLEURAL SPACE W/IMG GUIDE  05/19/2019  ? IR US GUIDE VASC ACCESS LEFT  09/22/2020  ? IR US GUIDE VASC ACCESS RIGHT  09/22/2020  ? IR VENOCAVAGRAM SVC  09/22/2020  ? LAMINECTOMY  05/27/2009  ? Lumbar decompressive laminectomy, fusion and plating for lumbar spinal stensosis  ? LIGATION OF ARTERIOVENOUS  FISTULA Left 09/24/2020  ? Procedure: LIGATION OF LEFT ARM ARTERIOVENOUS  FISTULA;  Surgeon: Rosetta Posner, MD;  Location: [facility];  Service: Vascular;  Laterality: Left;  ? LUMBAR LAMINECTOMY/DECOMPRESSION MICRODISCECTOMY Left 03/23/2013  ? Procedure: LUMBAR LAMINECTOMY/DECOMPRESSION MICRODISCECTOMY 1 LEVEL;  Surgeon: Eustace Moore, MD;  Location: [facility];  Service: Neurosurgery;  Laterality: Left;  LUMBAR LAMINECTOMY/DECOMPRESSION MICRODISCECTOMY 1 LEVEL  ? MASTECTOMY, PARTIAL Left 02/26/2003  ? ; S/P re-excision of cranial and lateral margins 04/19/2003.   ? RE-EXCISION OF BREAST CANCER,SUPERIOR MARGINS Right 10/27/2012  ? Procedure: RE-EXCISION OF BREAST CANCER,SUPERIOR and inferior MARGINS;  Surgeon: [name], MD;  Location: [facility];  Service: General;  Laterality: Right;  ? RE-EXCISION OF BREAST LUMPECTOMY Left 04/2003  ? TEE WITHOUT CARDIOVERSION N/A 04/04/2019  ? Procedure: Transesophageal Echocardiogram (Tee);  Surgeon: Wonda Olds, MD;  Location: [place];  Service: Open Heart Surgery;  Laterality: N/A;  ? THORACIC AORTIC ANEURYSM REPAIR N/A 04/04/2019  ? Procedure: THORACIC ASCENDING ANEURYSM REPAIR (AAA)  USING 28 MM X 30 CM HEMASHIELD PLATINUM VASCULAR GRAFT;  Surgeon: Wonda Olds, MD;  Location[facility];  Service: Open Heart Surgery;  Laterality: N/A;  ? ? ?  ?Family History  ?Problem Relation Age of Onset  ? Colon cancer Mother 107  ? Hypertension Mother   ? Diabetes Sister 84  ? Hypertension Sister   ? Diabetes Brother   ? Hypertension Brother   ? Diabetes Brother   ? Hypertension Brother   ? Kidney disease Son   ?     s/p renal transplant  ? Hypertension Son   ? Diabetes Son   ?  Multiple sclerosis Son   ? Bone cancer Sister 24  ? Breast cancer Neg Hx   ? Cervical cancer Neg Hx   ? ? ?  reports that she quit smoking about 2 years ago. Her smoking use included cigarettes. She has a 11.00 pack-year smoking history. She has never used smokeless tobacco. She reports that she does not currently use alcohol after a past usage of about 2.0 standard drinks per week. She reports that she does not currently use drugs after having used the following drugs: Cocaine. ?  ?Allergies  ?Allergen Reactions  ? Shrimp [Shellfish Allergy] Shortness Of Breath  ? Bactroban [Mupirocin] Other (See Comments)  ?  "Sores in nose"  ? Vicodin [Hydrocodone-Acetaminophen] Itching and Nausea And Vomiting  ?  This is patient's home medication  ? Eliquis [Apixaban] Itching  ?  Spoke with patient, no rash  ? Lisinopril Cough  ? Tylenol [Acetaminophen] Itching  ? ? ?Prior to Admission medications   ?Medication Sig Start Date End Date Taking? Authorizing Provider  ?albuterol (VENTOLIN HFA) 108 (90 Base) MCG/ACT inhaler Inhale  2 puffs into the lungs every 6 (six) hours as needed for wheezing or shortness of breath. 07/30/20   Maximiano Coss, NP  ?amLODipine (NORVASC) 10 MG tablet Take 1 tablet (10 mg total) by mouth daily. 02/14/21   Maximiano Coss, NP  ?apixaban (ELIQUIS) 2.5 MG TABS tablet Take 1 tablet (2.5 mg total) by mouth 2 (two) times daily. 06/09/21   Caren Griffins, MD  ?cloNIDine (CATAPRES - DOSED IN MG/24 HR) 0.1 mg/24hr patch Place 1 patch (0.1 mg total) onto the skin every Saturday. 06/14/21   Caren Griffins, MD  ?cyclobenzaprine (FLEXERIL) 5 MG tablet Take 5 mg by mouth 2 (two) times daily as needed for muscle spasms. 09/18/20   [provider]  ?hydrALAZINE (APRESOLINE) 50 MG tablet Take 1 tablet (50 mg total) by mouth 3 (three) times daily. 06/21/21 07/21/21  Nolberto Hanlon, MD  ?isosorbide dinitrate (ISORDIL) 40 MG tablet Take 1 tablet (40 mg total) by mouth 3 (three) times daily. 06/21/21 07/21/21  Nolberto Hanlon, MD  ?loperamide (IMODIUM) 2 MG capsule Take 2 mg by mouth as needed for diarrhea or loose stools. 07/15/20   [provider]  ?losartan (COZAAR) 100 MG tablet Take 1 tablet (100 mg total

## 2021-08-13 DIAGNOSIS — R4182 Altered mental status, unspecified: Secondary | ICD-10-CM

## 2021-08-13 DIAGNOSIS — I169 Hypertensive crisis, unspecified: Secondary | ICD-10-CM

## 2021-08-13 LAB — BASIC METABOLIC PANEL
Anion gap: 15 (ref 5–15)
BUN: 47 mg/dL — ABNORMAL HIGH (ref 8–23)
CO2: 25 mmol/L (ref 22–32)
Calcium: 8.2 mg/dL — ABNORMAL LOW (ref 8.9–10.3)
Chloride: 96 mmol/L — ABNORMAL LOW (ref 98–111)
Creatinine, Ser: 8.79 mg/dL — ABNORMAL HIGH (ref 0.44–1.00)
GFR, Estimated: 5 mL/min — ABNORMAL LOW (ref >60)
Glucose, Bld: 109 mg/dL — ABNORMAL HIGH (ref 70–99)
Potassium: 3.9 mmol/L (ref 3.5–5.1)
Sodium: 136 mmol/L (ref 135–145)

## 2021-08-13 LAB — COMPREHENSIVE METABOLIC PANEL
ALT: 24 U/L (ref 0–44)
AST: 18 U/L (ref 15–41)
Albumin: 3.3 g/dL — ABNORMAL LOW (ref 3.5–5.0)
Alkaline Phosphatase: 117 U/L (ref 38–126)
Anion gap: 11 (ref 5–15)
BUN: 39 mg/dL — ABNORMAL HIGH (ref 8–23)
CO2: 28 mmol/L (ref 22–32)
Calcium: 8 mg/dL — ABNORMAL LOW (ref 8.9–10.3)
Chloride: 98 mmol/L (ref 98–111)
Creatinine, Ser: 6.72 mg/dL — ABNORMAL HIGH (ref 0.44–1.00)
GFR, Estimated: 6 mL/min — ABNORMAL LOW (ref >60)
Glucose, Bld: 99 mg/dL (ref 70–99)
Potassium: 2.9 mmol/L — ABNORMAL LOW (ref 3.5–5.1)
Sodium: 137 mmol/L (ref 135–145)
Total Bilirubin: 0.4 mg/dL (ref 0.3–1.2)
Total Protein: 6.5 g/dL (ref 6.5–8.1)

## 2021-08-13 LAB — HEPATITIS B CORE ANTIBODY, TOTAL: Hep B Core Total Ab: NONREACTIVE

## 2021-08-13 LAB — PROTIME-INR
INR: 1.7 — ABNORMAL HIGH (ref 0.8–1.2)
Prothrombin Time: 19.8 seconds — ABNORMAL HIGH (ref 11.4–15.2)

## 2021-08-13 LAB — HEPATITIS B SURFACE ANTIGEN: Hepatitis B Surface Ag: NONREACTIVE

## 2021-08-13 LAB — TROPONIN I (HIGH SENSITIVITY): Troponin I (High Sensitivity): 176 ng/L (ref <18)

## 2021-08-13 MED ORDER — AMLODIPINE BESYLATE 10 MG PO TABS
10.0000 mg | ORAL_TABLET | Freq: Every day | ORAL | Status: DC
  Administered 2021-08-13 – 2021-08-15 (×3): 10 mg via ORAL
  Filled 2021-08-13 (×4): qty 1

## 2021-08-13 MED ORDER — LOSARTAN POTASSIUM 50 MG PO TABS
100.0000 mg | ORAL_TABLET | Freq: Every day | ORAL | Status: DC
  Administered 2021-08-13 – 2021-08-15 (×3): 100 mg via ORAL
  Filled 2021-08-13 (×5): qty 2

## 2021-08-13 MED ORDER — ISOSORBIDE DINITRATE 10 MG PO TABS
40.0000 mg | ORAL_TABLET | Freq: Three times a day (TID) | ORAL | Status: DC
  Administered 2021-08-13 – 2021-08-15 (×6): 40 mg via ORAL
  Filled 2021-08-13 (×4): qty 4
  Filled 2021-08-13: qty 2
  Filled 2021-08-13: qty 4
  Filled 2021-08-13: qty 2

## 2021-08-13 MED ORDER — LOSARTAN POTASSIUM 50 MG PO TABS: 100.0000 mg | ORAL_TABLET | Freq: Every day | ORAL | Status: DC

## 2021-08-13 MED ORDER — CHLORHEXIDINE GLUCONATE CLOTH 2 % EX PADS
6.0000 | MEDICATED_PAD | Freq: Every day | CUTANEOUS | Status: DC
  Administered 2021-08-13 – 2021-08-16 (×4): 6 via TOPICAL

## 2021-08-13 MED ORDER — HYDRALAZINE HCL 50 MG PO TABS
50.0000 mg | ORAL_TABLET | Freq: Three times a day (TID) | ORAL | Status: DC
  Administered 2021-08-13 – 2021-08-16 (×9): 50 mg via ORAL
  Filled 2021-08-13 (×10): qty 1

## 2021-08-13 MED ORDER — HYDRALAZINE HCL 20 MG/ML IJ SOLN: 5.0000 mg | Freq: Once | INTRAMUSCULAR | Status: DC | PRN

## 2021-08-13 MED ORDER — AMLODIPINE BESYLATE 10 MG PO TABS: 10.0000 mg | ORAL_TABLET | Freq: Every day | ORAL | Status: DC

## 2021-08-13 MED ORDER — OXYCODONE HCL 5 MG PO TABS
5.0000 mg | ORAL_TABLET | Freq: Once | ORAL | Status: AC
  Administered 2021-08-13: 5 mg via ORAL
  Filled 2021-08-13: qty 1

## 2021-08-13 MED ORDER — CLONIDINE HCL 0.2 MG PO TABS
0.2000 mg | ORAL_TABLET | Freq: Once | ORAL | Status: AC
  Administered 2021-08-13: 0.2 mg via ORAL
  Filled 2021-08-13 (×2): qty 1

## 2021-08-13 MED ORDER — ACETAMINOPHEN 325 MG PO TABS: 650.0000 mg | ORAL_TABLET | Freq: Once | ORAL | Status: DC

## 2021-08-13 MED ORDER — ISOSORBIDE DINITRATE 20 MG PO TABS
40.0000 mg | ORAL_TABLET | Freq: Three times a day (TID) | ORAL | Status: DC
Start: 2021-08-13 — End: 2021-08-13

## 2021-08-13 MED ORDER — HYDRALAZINE HCL 50 MG PO TABS
50.0000 mg | ORAL_TABLET | Freq: Three times a day (TID) | ORAL | Status: DC
Start: 2021-08-13 — End: 2021-08-13

## 2021-08-13 MED ORDER — ACETAMINOPHEN 325 MG PO TABS
650.0000 mg | ORAL_TABLET | ORAL | Status: DC | PRN
  Administered 2021-08-13 – 2021-08-14 (×2): 650 mg via ORAL
  Filled 2021-08-13 (×4): qty 2

## 2021-08-13 NOTE — TOC Initial Note (Signed)
Transition of Care (TOC) - Initial/Assessment Note  ? ? ?Patient Details  ?Name: Monique Henderson ?MRN: [id number] ?Date of Birth: 07/01/1950 ? ?Transition of Care (TOC) CM/SW Contact:    ?Paulene Floor Gurshaan Matsuoka, LCSWA ?Phone Number: ?08/13/2021, 12:08 PM ? ?Clinical Narrative:                 ?CSW received consult for possible SNF placement at time of discharge. CSW spoke with patient at bedside. Patient reported that patient is from home alone and was caring for herself and driving prior to admission.  Patient declined SNF placement stating that patient "does not need any help".  CSW spoke with the patient about PT's recommendation and patient was adamant about going home.  No further questions reported at this time.  ? ?Skilled Nursing Rehab Facilities-   [email or web address]   Ratings out of 5 possible   ?Name Address  Phone # Quality Care Staffing Health Inspection Overall  ?[facility] [address] [place] '5 5 2 4  '$ ?[place]  [address], [place] [phone] '4 2 5 5  '$ ?[facility] [place], [place] '4 1 1 1  '$ ?[facility] [place], [place] '2 2 4 4  '$ ?[facility] [address] [place] '1 1 2 1  '$ ?[place] [phone] '2 1 4 3  '$ ?[place] '5 2 2 3  '$ ?[facility] [address], [place] '5 2 2 3  '$ ?[facility] [place] [phone] '5 1 2 2  '$ ?[facility] [address], [place] [phone] '4 1 1 1  '$ ?[facility] [address] [place] [phone] '4 1 2 1  '$ ?[facility] [place] [phone] '4 1 1 1  '$ ?        ?[place], [place]      ?[facility] [place] '4 2 3 3  '$ ?[facility]  [place], [place] '3 1 5 4  '$ ?[place], [place] [address] [place], [place] [phone] '2 1 1 1  '$ ?[facility] [address] [place] [phone] '2 2 3 3  '$ ?        ?[address] (no [facility]) [address], [place] [phone] '4 5 5 5  '$ ?[facility] (No Hum[employer]) [address], [place] '4 1 4 3  '$ ?[facility] (No [employer]) [address] [place] [phone] '5 5 5 5  '$ ?[facility] [address], [place] [phone] '3 2 4 4  '$ ?[facility] [address], [place] '3 3 4 4  '$ ?[place] [address], [place] '1 1 2 1  '$ ?[facility] [address] [place] [phone] '2 1 1 1  '$ ?[facility] [place] '5 2 4 5  '$ ?[facility] [address] [place] '3 1 1 1  '$ ?[facility] [place], [place] '2 1 2 1  '$ ?        ?[facility] [address], [place] [phone] '1 1 1 1  '$ ?[facility] [address], [place]  [phone] '2 4 2 2  '$ ?[facility] [address], [place] [phone] '5 2 3 4  '$ ?[facility] [address], [place] '2 1 1 1  '$ ?[facility] (No Humana) [address] [place] '2 1 3 2  '$ ?[facility] [address] [place] [phone] '3 1 1 1  '$ ?        ?[facility] [place], [place] '5 4 5 5  '$ ?[facility])  [address], [place] '2 2 3 3  '$ ?[facility] [place], [place] '3 2 4 4  '$ ?[facility] [address], [place] '4 3 4 4  '$ ?[address] [place], [place] '3 3 1 1  '$ ?[facility]) [address] [place] [phone] '2 2 4 4  '$ ?  ? ?Expected Discharge Plan: [place] ?Barriers to Discharge: Continued Medical Work up ? ? ?Patient  Goals and CMS Choice ?Patient states their goals for this hospitalization and ongoing recovery are:: to go back home ?  ?  ? ?Expected Discharge Plan and Services ?Expected Discharge Plan: [place] ?  ?  ?  ?Living arrangements for the past 2 months: Apartment ?                ?  ?  ?  ?  ?  ?  ?  ?  ?  ?  ? ?Prior Living Arrangements/Services ?Living arrangements for the past 2 months: Apartment ?Lives with:: Self ?Patient language and need for interpreter reviewed:: Yes ?Do you feel safe going back to the place where you live?: Yes      ?Need for Family Participation in Patient Care: Yes (Comment) ?Care giver support system in place?: No (comment) ?  ?Criminal Activity/Legal Involvement Pertinent to Current Situation/Hospitalization: No - Comment as needed ? ?Activities of Daily Living ?  ?  ? ?Permission Sought/Granted ?  ?  ?   ?   ?   ?   ? ?Emotional Assessment ?Appearance:: Appears older than stated age ?Attitude/Demeanor/Rapport: Guarded ?Affect (typically observed): Flat ?Orientation: : Oriented to Self, Oriented to Place, Oriented to  Time, Oriented to Situation ?Alcohol / Substance Use: Not Applicable ?Psych Involvement: No (comment) ? ?Admission diagnosis:  ESRD (end stage renal disease) on dialysis ([place]) [N18.6, Z99.2] ?Patient Active Problem List  ? Diagnosis Date Noted  ? ESRD (end stage renal disease) on dialysis ([place]) 08/12/2021  ? Malignant hypertension 06/19/2021  ? Anemia 06/05/2021  ? Pure hypercholesterolemia 02/14/2021  ? Acute pulmonary edema ([place]) 12/23/2020  ? Acute respiratory failure with hypoxia ([place]) 09/21/2020  ? Acute renal failure superimposed on chronic kidney disease ([place]) 09/21/2020  ? Community acquired pneumonia 09/21/2020  ? Uremia 09/21/2020  ? Metabolic acidosis 93/81/8299  ? Hypertensive urgency 09/21/2020  ? On Coumadin for atrial fibrillation ([place]) 10/03/2019  ? Troponin level elevated 08/28/2019  ? Positive D dimer 08/28/2019  ? Hyperkalemia 08/28/2019  ?  SVT (supraventricular tachycardia) ([place]) 08/28/2019  ? SIRS (systemic inflammatory response syndrome) ([place]) 08/27/2019  ? Paroxysmal SVT (supraventricular tachycardia) ([place]) 08/27/2019  ? Aortic atherosclerosis ([place]) 07/05/2019  ? Chest pain 07/05/2019  ? History of CVA (cerebrovascular accident) 05/29/2019  ? AF (paroxysmal atrial fibrillation) ([place]) 05/29/2019  ? ESRD on dialysis [facility]) 05/29/2019  ? Cerebral thrombosis with cerebral infarction 05/22/2019  ? Malnutrition of moderate degree 05/19/2019  ? Pleural effusion 05/18/2019  ? Atrial fibrillation with RVR ([place]) 05/18/2019  ? S/P aortic aneurysm repair 04/07/2019  ? Aortic dissection ([place]) 04/04/2019  ? Dissection of aorta ([place]) 04/03/2019  ? Non compliance with medical  treatment 12/04/2017  ? CKD (chronic kidney disease), stage IV ([place]) 07/19/2017  ? Chronic obstructive lung disease ([place]) 01/16/2017  ? Chronic low back pain 06/22/2016  ? Insomnia 03/14/2015  ? S/P lumbar spinal fusion 01/18/2014  ? Tobacco use disorder 04/19/2009  ? Essential hypertension 09/16/2006  ? GERD 09/16/2006  ? ?PCP:  Myrtie Neither, PA-C ?Pharmacy:   ?[place] [place] [facility] [place] [address] ?[place] [address] ?Phone: [phone] Fax: [phone] ? ?[facility] ?[address] ?[place] [address] ?Phone: [phone] Fax: [phone] ? ? ? ? ?Social Determinants of Health (SDOH) Interventions ?  ? ?Readmission Risk Interventions ?Readmission Risk Prevention Plan 05/22/2019  ?Transportation Screening Complete  ?PCP or Specialist Appt within 3-5 Days Complete  ?[place] or Home Care Consult Complete  ?Social Work Consult for [place] Planning/Counseling Complete  ?Palliative Care Screening Not Applicable  ?Medication Review Press photographer) Complete  ?Some recent data might be hidden  ? ? ? ?

## 2021-08-13 NOTE — Progress Notes (Signed)
Update on plan of care and patient condition given with consent of patient an in presence of patient to niece not on file, phone number [phone]. ? ?MD informed of ongoing headache. Pain management orders to follow. ?

## 2021-08-13 NOTE — Evaluation (Signed)
Occupational Therapy Evaluation ?Patient Details ?Name: Monique Henderson ?MRN: [id number] ?DOB: 05-22-1951 ?Today's Date: 08/13/2021 ? ? ?History of Present Illness 71 y.o. female admitted 08/12/21 presenting with 1 week of AMS, dizziness and missing HD for 1 week; chest pain, SOB, and potential drug use (fentanyl). PMH is significant for ESRD on HD TTS, hypertension, GERD, tobacco use disorder, aortic dissection status post aortic aneurysm repair, paroxysmal A-fib with RVR, and medication noncompliance  ? ?Clinical Impression ?  ?Pt is typically at home, gets assist from her sister (who works part time and is not always available), per chart review independent in ADL and mobility (although from last admission "furniture surfs" and should likely be using a RW). Today she is very lethargic, confused, and unreliable historian. She was also labile during session. MIn A +2 for bed mobility and transfers with 2 person face to face transfer to recliner. She engaged in grooming tasks, but resistant at first and requiring assist to initiate. Suspect that she will decline to go to SNF, but from chart review she does not have 24/7 assist from sister who works part time. She presents as a very high fall risk with decreased cognition, safety awareness, balance, strength and activity tolerance. SNF post-acute is the safest and essential to maximize safety and independence in ADL and functional transfers.  ?   ? ?Recommendations for follow up therapy are one component of a multi-disciplinary discharge planning process, led by the attending physician.  Recommendations may be updated based on patient status, additional functional criteria and insurance authorization.  ? ?Follow Up Recommendations ? Skilled nursing-short term rehab (<3 hours/day)  ?  ?Assistance Recommended at Discharge Frequent or constant Supervision/Assistance  ?Patient can return home with the following A lot of help with walking and/or transfers;A lot of help with  bathing/dressing/bathroom;Assistance with cooking/housework;Assistance with feeding;Direct supervision/assist for medications management;Direct supervision/assist for financial management;Assist for transportation;Help with stairs or ramp for entrance ? ?  ?Functional Status Assessment ? Patient has had a recent decline in their functional status and demonstrates the ability to make significant improvements in function in a reasonable and predictable amount of time.  ?Equipment Recommendations ? Other (comment) (defer to next venue of care)  ?  ?Recommendations for Other Services PT consult;Speech consult (cognition) ? ? ?  ?Precautions / Restrictions Precautions ?Precautions: Fall ?Restrictions ?Weight Bearing Restrictions: No  ? ?  ? ?Mobility Bed Mobility ?Overal bed mobility: Needs Assistance ?Bed Mobility: Supine to Sit ?  ?  ?Supine to sit: Supervision, HOB elevated ?  ?  ?General bed mobility comments: supervision for safety, no physical assist required ?  ? ?Transfers ?Overall transfer level: Needs assistance ?Equipment used: 2 person hand held assist ?Transfers: Sit to/from Stand, Bed to chair/wheelchair/BSC ?Sit to Stand: Min assist, +2 safety/equipment ?  ?  ?Step pivot transfers: Min assist, +2 physical assistance, +2 safety/equipment ?  ?  ?General transfer comment: assist to boost up into standing and initiation of movement. Patient taking steps towards recliner but requiring minA+2 for balance as patient unsteady while taking steps and reporting dizziness ?  ? ?  ?Balance Overall balance assessment: Needs assistance ?Sitting-balance support: No upper extremity supported, Feet supported ?Sitting balance-Leahy Scale: Fair ?Sitting balance - Comments: when awake, fair balance. When falling asleep, minA to maintain and wake patient ?  ?Standing balance support: Bilateral upper extremity supported, During functional activity ?Standing balance-Leahy Scale: Poor ?Standing balance comment: minA+2 for balance  with HHAx2 ?  ?  ?  ?  ?  ?  ?  ?  ?  ?  ?  ?   ? ?  ADL either performed or assessed with clinical judgement  ? ?ADL Overall ADL's : Needs assistance/impaired ?Eating/Feeding: Moderate assistance ?Eating/Feeding Details (indicate cue type and reason): Pt actively falling asleep while eating apple sauce, spilled all over herself, no awareness ?Grooming: Dance movement psychotherapist;Moderate assistance;Sitting;Bed level ?Grooming Details (indicate cue type and reason): poor arousal - falling asleep, but mad when therapists used cold wash cloth ?Upper Body Bathing: Moderate assistance ?  ?Lower Body Bathing: Maximal assistance ?  ?Upper Body Dressing : Moderate assistance;Sitting ?Upper Body Dressing Details (indicate cue type and reason): to change gown ?Lower Body Dressing: Maximal assistance;Bed level ?Lower Body Dressing Details (indicate cue type and reason): to don socks ?Toilet Transfer: Minimal assistance;+2 for physical assistance;+2 for safety/equipment;Stand-pivot;BSC/3in1 ?Toilet Transfer Details (indicate cue type and reason): simulated through recliner transfer ?Toileting- Clothing Manipulation and Hygiene: Moderate assistance ?Toileting - Clothing Manipulation Details (indicate cue type and reason): Pt still in personal underwear ?  ?  ?Functional mobility during ADLs: Minimal assistance;+2 for physical assistance;+2 for safety/equipment ?General ADL Comments: Pt very lethargic, difficulty arousing patient, labile during session  ? ? ? ?Vision Baseline Vision/History: 1 Wears glasses ?Additional Comments: glasses in room  ?   ?Perception   ?  ?Praxis   ?  ? ?Pertinent Vitals/Pain Pain Assessment ?Pain Assessment: Faces ?Faces Pain Scale: Hurts even more ?Pain Location: head, stomach ?Pain Descriptors / Indicators: Aching, Grimacing, Discomfort ?Pain Intervention(s): Monitored during session, Limited activity within patient's tolerance, Repositioned, Other (comment) (RN coordinating with MD for pain medication)   ? ? ? ?Hand Dominance Right ?  ?Extremity/Trunk Assessment Upper Extremity Assessment ?Upper Extremity Assessment: Generalized weakness ?  ?Lower Extremity Assessment ?Lower Extremity Assessment: Defer to PT evaluation ?  ?Cervical / Trunk Assessment ?Cervical / Trunk Assessment: Kyphotic ?  ?Communication Communication ?Communication: No difficulties ?  ?Cognition Arousal/Alertness: Lethargic ?Behavior During Therapy: Anxious ?Overall Cognitive Status: Impaired/Different from baseline ?Area of Impairment: Attention, Memory, Following commands, Safety/judgement, Problem solving, Awareness ?  ?  ?  ?  ?  ?  ?  ?  ?  ?Current Attention Level: Sustained ?Memory: Decreased short-term memory ?Following Commands: Follows one step commands inconsistently ?Safety/Judgement: Decreased awareness of safety ?Awareness: Intellectual ?Problem Solving: Slow processing, Decreased initiation, Difficulty sequencing, Requires verbal cues, Requires tactile cues ?General Comments: lethargic throughout requiring frequent cueing and tactile stimulation to maintain wakefulness. Following commands intermittently depending on alertness. Patient becoming emotionally labile at times.Able to state name, birthday and at hospital but seems generally confused about current situation ?  ?  ?General Comments  BP at end of session 147/91 - RN aware ? ?  ?Exercises   ?  ?Shoulder Instructions    ? ? ?Home Living Family/patient expects to be discharged to:: Private residence ?Living Arrangements: Alone ?Available Help at Discharge: Family;Available PRN/intermittently (sister there 1/2 day every day) ?Type of Home: Apartment ?Home Access: Level entry ?  ?  ?Home Layout: Two level ?Alternate Level Stairs-Number of Steps: stair lift up to bedroom ?  ?Bathroom Shower/Tub: Tub/shower unit ?  ?Bathroom Toilet: Standard ?  ?  ?Home Equipment: Conservation officer, nature (2 wheels);Cane - single point;BSC/3in1 ?  ?Additional Comments: information gleaned from 06/2021  admission as patient too lethargic to provide information ?  ? ?  ?Prior Functioning/Environment Prior Level of Function : Independent/Modified Independent ?  ?  ?  ?  ?  ?  ?  ?  ?  ? ?  ?  ?OT Problem List: TAVW

## 2021-08-13 NOTE — Progress Notes (Signed)
Patient moved to room 06 on 3E. Alert with no distress noted. Lelon Frohlich, RN at bedside.  ?

## 2021-08-13 NOTE — Progress Notes (Signed)
Discussed with Dr. Owens Shark via secure chat that Dr. Daron Offer note states patient expressed passive suicidal ideation over the past several weeks therefore RN asked Dr. Owens Shark if patient needs a sitter. Patient has not expressed any thoughts of self harm nor demonstrated any s/s of such during shift. Dameron, MD stated via secure chat that patient does NOT need a sitter but should be progressive care.  ?

## 2021-08-13 NOTE — Evaluation (Signed)
Clinical/Bedside Swallow Evaluation ?Patient Details  ?Name: Monique Henderson ?MRN: [id number] ?Date of Birth: 1950/12/14 ? ?Today's Date: 08/13/2021 ?Time: SLP Start Time (ACUTE ONLY): 0900 SLP Stop Time (ACUTE ONLY): 0915 ?SLP Time Calculation (min) (ACUTE ONLY): 15 min ? ?Past Medical History:  ?Past Medical History:  ?Diagnosis Date  ? AF (paroxysmal atrial fibrillation) ([place]) 05/29/2019  ? on Coumadin  ? Aortic atherosclerosis ([place]) 07/05/2019  ? Aortic dissection ([place]) 04/04/2019  ? s/p repair  ? Bone spur 2008  ? Right calcaneal foot spur  ? Breast cancer ([place]) 2004  ? Ductal carcinoma in situ of the left breast; S/P left partial mastectomy 02/26/2003; S/P re-excision of cranial and lateral margins11/18/2004.radiation  ? Cerebral thrombosis with cerebral infarction 05/22/2019  ? Chronic low back pain 06/22/2016  ? Chronic obstructive lung disease ([place]) 01/16/2017  ? 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 S/P breast lumpectomy 10/13/2012 by Dr. Autumn Messing; S/P re-excision of superior and inferior margins 10/27/2012.   ? ESRD on dialysis [facility]) 05/29/2019  ? Essential hypertension 09/16/2006  ? GERD 09/16/2006  ? Hepatitis C   ? treated and RNA confirmed not detectable 01/2017  ? Insomnia 03/14/2015  ? Malnutrition of moderate degree 05/19/2019  ? Non compliance with medical treatment 12/04/2017  ? Normocytic anemia   ? With thrombocytosis  ? Osteoarthritis   ? Right ureteral stone 2002  ? S/P lumbar spinal fusion 01/18/2014  ? S/P lumbar decompressive laminectomy, fusion, and plating for lumbar spinal stenosis on 05/27/2009 by Dr. Eustace Moore.  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 Eustace Moore.    ? Tobacco use disorder 04/19/2009  ? Uterine fibroid   ? Wears dentures   ? top  ? ?Past Surgical  History:  ?Past Surgical History:  ?Procedure Laterality Date  ? ANTERIOR LAT LUMBAR FUSION N/A 01/18/2014  ? Procedure: ANTERIOR LATERAL LUMBAR FUSION LUMBAR TWO-THREE;  Surgeon: Eustace Moore, MD;  Location: [place] [facility]UR[facility];  Service: Neurosurgery;  Laterality: N/A;  ? ANTERIOR LUMBAR FUSION  01/18/2014  ? AV FISTULA PLACEMENT Left 04/20/2019  ? Procedure: ARTERIOVENOUS (AV) FISTULA CREATION;  Surgeon: [name], MD;  Location: [place];  Service: Vascular;  Laterality: Left;  ? BACK SURGERY    ? BREAST LUMPECTOMY Left 01/2003  ? BREAST LUMPECTOMY Right 2014  ? BREAST LUMPECTOMY WITH NEEDLE LOCALIZATION AND AXILLARY SENTINEL LYMPH NODE BX Right 10/13/2012  ? Procedure: BREAST LUMPECTOMY WITH NEEDLE LOCALIZATION;  Surgeon: [name], MD;  Location: [place];  Service: General;  Laterality: Right;  Right breast wire localized lumpectomy  ? INSERTION OF DIALYSIS CATHETER Right 04/20/2019  ? Procedure: INSERTION OF DIALYSIS CATHETER, right internal jugular;  Surgeon: [name], MD;  Location: [place];  Service: Vascular;  Laterality: Right;  ? INSERTION OF DIALYSIS CATHETER Right 09/24/2020  ? Procedure: INSERTION OF TUNNELED DIALYSIS CATHETER;  Surgeon: Rosetta Posner, MD;  [facility];  Service: Vascular;  Laterality: Right;  ? IR FLUORO GUIDE CV LINE RIGHT  09/22/2020  ? IR THORACENTESIS ASP PLEURAL SPACE W/IMG GUIDE  05/19/2019  ? IR US GUIDE VASC ACCESS LEFT  09/22/2020  ? IR US GUIDE VASC ACCESS RIGHT  09/22/2020  ? IR VENOCAVAGRAM SVC  09/22/2020  ? LAMINECTOMY  05/27/2009  ? Lumbar decompressive  laminectomy, fusion and plating for lumbar spinal stensosis  ? LIGATION OF ARTERIOVENOUS  FISTULA Left 09/24/2020  ? Procedure: LIGATION OF LEFT ARM ARTERIOVENOUS  FISTULA;  Surgeon: Rosetta Posner, MD;  Location: [place];  Service: Vascular;  Laterality: Left;  ? LUMBAR LAMINECTOMY/DECOMPRESSION MICRODISCECTOMY Left 03/23/2013  ? Procedure: LUMBAR LAMINECTOMY/DECOMPRESSION  MICRODISCECTOMY 1 LEVEL;  Surgeon: Eustace Moore, MD;  Location: [place] NEURO ORS;  Service: Neurosurgery;  Laterality: Left;  LUMBAR LAMINECTOMY/DECOMPRESSION MICRODISCECTOMY 1 LEVEL  ? MASTECTOMY, PARTIAL Left 02/26/2003  ? ; S/P re-excision of cranial and lateral margins 04/19/2003.   ? RE-EXCISION OF BREAST CANCER,SUPERIOR MARGINS Right 10/27/2012  ? Procedure: RE-EXCISION OF BREAST CANCER,SUPERIOR and inferior MARGINS;  Surgeon: [name], MD;  Location: [place];  Service: General;  Laterality: Right;  ? RE-EXCISION OF BREAST LUMPECTOMY Left 04/2003  ? TEE WITHOUT CARDIOVERSION N/A 04/04/2019  ? Procedure: Transesophageal Echocardiogram (Tee);  Surgeon: Wonda Olds, MD;  Location: [place];  Service: Open Heart Surgery;  Laterality: N/A;  ? THORACIC AORTIC ANEURYSM REPAIR N/A 04/04/2019  ? Procedure: THORACIC ASCENDING ANEURYSM REPAIR (AAA)  USING 28 MM X 30 CM HEMASHIELD PLATINUM VASCULAR GRAFT;  Surgeon: Wonda Olds, MD;  Location: [facility];  Service: Open Heart Surgery;  Laterality: N/A;  ? ?HPI:  ?Patient is a 71 y.o. female with PMH: HD dependent, a-fib, COPD, HTN, GERD, hepatitis C, h/o very poor non-compliance with medications and has missed the past 3 HD appointments with last dialysis on 08/02/21. Patient presented to the ER with c/o hurting all over and SOB. She took a pill of unknown type on Friday (3/10) from a friend who thought it was fentanyl and since then patient has felt dizzy. In ED, patient's UDS positive for cocaine, CXR showed cardiac enlargement with pulmonary vascular congestion; UA abnormal; BP 204/128 in ED which had downtrended to 160/118. She had dialysis on 3/15 early AM and c/o headache, with BP increasing during dialysis. She was agitated, refusing morning labs and with agitation increasing her BP.  ?  ?Assessment / Plan / Recommendation  ?Clinical Impression ? Patient not currently presenting with clinical s/s of oropharyngeal dysphagia, however she does have a h/o GERD and did  have a couple instance of mild belching during PO intake of milk. She did not exhibit any overt s/s aspiration or penetration with straw sips of thin liquids (milk). She refused any other solid or liquid PO's as she said milk was the only thing that would "make me feel full" and would not hurt her stomach. Patient's alertness and attention fluctuated as she would have eyes open, groaning and c/o severe headache but then just few moments later (at times less than a minute), she would have eyes closed, appearing calm and in no distress. SLP is not recommending further skilled treatment or assessment at this time and patient appears safe to continue on regular texture solids, thin liquids diet. ?SLP Visit Diagnosis: Dysphagia, unspecified (R13.10) ?   ?Aspiration Risk ? No limitations  ?  ?Diet Recommendation Regular;Thin liquid  ? ?Liquid Administration via: Straw;Cup ?Medication Administration: Whole meds with liquid ?Supervision: Patient able to self feed ?Compensations: Slow rate;Small sips/bites ?Postural Changes: Seated upright at 90 degrees  ?  ?Other  Recommendations Oral Care Recommendations: Oral care BID   ? ?Recommendations for follow up therapy are one component of a multi-disciplinary discharge planning process, led by the attending physician.  Recommendations may be updated based on patient status, additional functional criteria and insurance  authorization. ? ?Follow up Recommendations No SLP follow up  ? ? ?  ?Assistance Recommended at Discharge None  ?Functional Status Assessment Patient has had a recent decline in their functional status and demonstrates the ability to make significant improvements in function in a reasonable and predictable amount of time.  ?Frequency and Duration    ? N /A ?  ?   ? ?Prognosis Prognosis for Safe Diet Advancement: Good  ? ?  ? ?Swallow Study   ?General Date of Onset: 08/12/21 ?HPI: Patient is a 71 y.o. female with PMH: HD dependent, a-fib, COPD, HTN, GERD, hepatitis  C, h/o very poor non-compliance with medications and has missed the past 3 HD appointments with last dialysis on 08/02/21. Patient presented to the ER with c/o hurting all over and SOB. She took a pill of unknow

## 2021-08-13 NOTE — Evaluation (Signed)
Physical Therapy Evaluation ?Patient Details ?Name: Monique Henderson ?MRN: [id number] ?DOB: [birth year] ?Today's Date: 08/13/2021 ? ?History of Present Illness ? 71 y.o. female admitted 08/12/21 presenting with 1 week of AMS, dizziness and missing HD for 1 week; chest pain, SOB, and potential drug use (fentanyl). 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 Patient admitted with the above findings. Patient presents with impaired cognition, decreased activity tolerance, impaired balance, generalized weakness, and impaired coordination. Patient lethargic throughout session and following limited commands. Patient required minA+2 for sit to stand and step pivot transfer. Deferred further mobility due to lethargy and patient with difficulty keeping eyes open at times. Patient will benefit from skilled PT services during acute stay to address listed deficits. Recommend SNF at discharge to maximize functional independence and safety prior to returning home alone. Will continue to follow acutely.  ?   ? ?Recommendations for follow up therapy are one component of a multi-disciplinary discharge planning process, led by the attending physician.  Recommendations may be updated based on patient status, additional functional criteria and insurance authorization. ? ?Follow Up Recommendations Skilled nursing-short term rehab (<3 hours/day) ? ?  ?Assistance Recommended at Discharge Frequent or constant Supervision/Assistance  ?Patient can return home with the following ? A lot of help with walking and/or transfers;A lot of help with bathing/dressing/bathroom;Assistance with cooking/housework;Direct supervision/assist for medications management;Direct supervision/assist for financial management;Assist for transportation;Help with stairs or ramp for entrance ? ?   ?Equipment Recommendations Other (comment) (TBD.)  ?Recommendations for Other Services ?    ?  ?Functional Status Assessment Patient has had a recent decline in their functional status and/or demonstrates limited ability to make significant improvements in function in a reasonable and predictable amount of time  ? ?  ?Precautions / Restrictions Precautions ?Precautions: Fall ?Restrictions ?Weight Bearing Restrictions: No  ? ?  ? ?Mobility ? Bed Mobility ?Overal bed mobility: Needs Assistance ?Bed Mobility: Supine to Sit ?  ?  ?Supine to sit: Supervision, HOB elevated ?  ?  ?General bed mobility comments: supervision for safety, no physical assist required ?  ? ?Transfers ?Overall transfer level: Needs assistance ?Equipment used: 2 person hand held assist ?Transfers: Sit to/from Stand, Bed to chair/wheelchair/BSC ?Sit to Stand: Min assist, +2 safety/equipment ?  ?Step pivot transfers: Min assist, +2 physical assistance, +2 safety/equipment ?  ?  ?  ?General transfer comment: assist to boost up into standing and initiation of movement. Patient taking steps towards recliner but requiring minA+2 for balance as patient unsteady while taking steps and reporting dizziness ?  ? ?Ambulation/Gait ?  ?  ?  ?  ?  ?  ?  ?General Gait Details: deferred due to lethargy ? ?Stairs ?  ?  ?  ?  ?  ? ?Wheelchair Mobility ?  ? ?Modified Rankin (Stroke Patients Only) ?  ? ?  ? ?Balance Overall balance assessment: Needs assistance ?Sitting-balance support: No upper extremity supported, Feet supported ?Sitting balance-Leahy Scale: Fair ?Sitting balance - Comments: when awake, fair balance. When falling asleep, minA to maintain and wake patient ?  ?Standing balance support: Bilateral upper extremity supported, During functional activity ?Standing balance-Leahy Scale: Poor ?Standing balance comment: minA+2 for balance with HHAx2 ?  ?  ?  ?  ?  ?  ?  ?  ?  ?  ?  ?   ? ? ? ?Pertinent  Vitals/Pain Pain Assessment ?Pain Assessment: Faces ?Faces  Pain Scale: Hurts even more ?Pain Location: head, stomach ?Pain Descriptors / Indicators: Aching, Grimacing, Discomfort ?Pain Intervention(s): Monitored during session, Repositioned  ? ? ?Home Living Family/patient expects to be discharged to:: Private residence ?Living Arrangements: Alone ?Available Help at Discharge: Family;Available PRN/intermittently (sister there 1/2 day every day) ?Type of Home: Apartment ?Home Access: Level entry ?  ?  ?Alternate Level Stairs-Number of Steps: stair lift up to bedroom ?Home Layout: Two level ?Home Equipment: Conservation officer, nature (2 wheels);Cane - single point;BSC/3in1 ?Additional Comments: information gleaned from 06/2021 admission as patient too lethargic to provide information  ?  ?Prior Function Prior Level of Function : Independent/Modified Independent ?  ?  ?  ?  ?  ?  ?  ?  ?  ? ? ?Hand Dominance  ?   ? ?  ?Extremity/Trunk Assessment  ? Upper Extremity Assessment ?Upper Extremity Assessment: Defer to OT evaluation ?  ? ?Lower Extremity Assessment ?Lower Extremity Assessment: Generalized weakness ?  ? ?Cervical / Trunk Assessment ?Cervical / Trunk Assessment: Kyphotic  ?Communication  ? Communication: No difficulties  ?Cognition Arousal/Alertness: Lethargic ?Behavior During Therapy: Anxious ?Overall Cognitive Status: Impaired/Different from baseline ?Area of Impairment: Attention, Memory, Following commands, Safety/judgement, Problem solving, Awareness ?  ?  ?  ?  ?  ?  ?  ?  ?  ?Current Attention Level: Sustained ?Memory: Decreased short-term memory ?Following Commands: Follows one step commands inconsistently ?Safety/Judgement: Decreased awareness of safety ?Awareness: Intellectual ?Problem Solving: Slow processing, Decreased initiation, Difficulty sequencing, Requires verbal cues, Requires tactile cues ?General Comments: lethargic throughout requiring frequent cueing and tactile stimulation to maintain wakefulness. Following commands intermittently depending on alertness.  Patient becoming emotionally labile at times. Able to answer orientation questions but seems generally confused about current situation ?  ?  ? ?  ?General Comments General comments (skin integrity, edema, etc.): BP at end of session 147/91 ? ?  ?Exercises    ? ?Assessment/Plan  ?  ?PT Assessment Patient needs continued PT services  ?PT Problem List Decreased strength;Decreased activity tolerance;Decreased mobility;Decreased coordination;Decreased cognition;Decreased knowledge of use of DME;Decreased safety awareness;Decreased knowledge of precautions ? ?   ?  ?PT Treatment Interventions DME instruction;Gait training;Functional mobility training;Therapeutic activities;Therapeutic exercise;Balance training;Cognitive remediation;Patient/family education   ? ?PT Goals (Current goals can be found in the Care Plan section)  ?Acute Rehab PT Goals ?Patient Stated Goal: did not state ?PT Goal Formulation: Patient unable to participate in goal setting ?Time For Goal Achievement: 08/27/21 ?Potential to Achieve Goals: Fair ? ?  ?Frequency Min 2X/week ?  ? ? ?Co-evaluation   ?  ?  ?  ?  ? ? ?  ?AM-PAC PT "6 Clicks" Mobility  ?Outcome Measure Help needed turning from your back to your side while in a flat bed without using bedrails?: A Little ?Help needed moving from lying on your back to sitting on the side of a flat bed without using bedrails?: A Little ?Help needed moving to and from a bed to a chair (including a wheelchair)?: Total ?Help needed standing up from a chair using your arms (e.g., wheelchair or bedside chair)?: Total ?Help needed to walk in hospital room?: Total ?Help needed climbing 3-5 steps with a railing? : Total ?6 Click Score: 10 ? ?  ?End of Session Equipment Utilized During Treatment: Gait belt ?Activity Tolerance: Patient limited by lethargy ?Patient left: in chair;with call bell/phone within reach;with chair alarm set ?Nurse Communication: Mobility status ?PT Visit Diagnosis: Unsteadiness on  feet  (R26.81);Muscle weakness (generalized) (M62.81);Other abnormalities of gait and mobility (R26.89) ?  ? ?Time: 0979-4997 ?PT Time Calculation (min) (ACUTE ONLY): 27 min ? ? ?Charges:   PT Evaluation ?$PT Eval Moderate Co

## 2021-08-13 NOTE — Progress Notes (Signed)
Report called to Domenica Reamer, RN on 3E. Patient will be moved to room 6 on 3E.  ?

## 2021-08-13 NOTE — Progress Notes (Signed)
FPTS Interim Progress Note ? ?S:Called by pts nurse d/t pt refusing labs and seeming confused. Went to see pt and explained the need for labs. She initially agreed to have labs drawn but quickly changed her mind saying we had just drawn labs. She states she still has a headache but that " ya'll are the reason I have a headache". She also says she feels fine and that her blood pressure is "always that high". ? ?O: ?BP (!) 194/104   Pulse 63   Temp 98.4 ?F (36.9 ?C)   Resp 17   Wt 43 kg   SpO2 99%   BMI 16.79 kg/m?   ?General: Patient lying in bed, responding to questions, keeping eyes closed in between responses, NAD ?Respiratory: Patient is breathing comfortably on room air ? ?A/P: ?Hypertension ?Home medications were added about 30 minutes prior to the reading of 194/104 had been taken.  Patient is currently agitated, not wanting to have her blood drawn, not wanting to move to progressive unit.  Her agitation is likely increasing her blood pressure.  Will allow patient to rest and then attempt to get labs again and recheck blood pressure. ? ?Precious Gilding, DO ?08/13/2021, 5:45 AM ?PGY-1, [place] [facility] ?Service pager [phone] ? ?

## 2021-08-13 NOTE — Progress Notes (Signed)
?[facility] ?Progress Note  ? ?71 y.o. female.  With ESRD   HD dep (HD TTS [facility] )since 04/06/19 after aortic dissection and repair-, P A-fib also COPD tobacco disorder, hypertension, GERD, and hepatitis C. ?Now presents to the ER complaining of hurting all over and short of breath.  She has missed her last 3 outpatient dialysis, last dialysis 08/02/21.  Pt took a pill of unknown type Friday from friend who thought it was fentanyl since then was dizzy and did not feel good ?  ?Assessment/ Plan:   ?Dialysis Orders: Center: [facility] EDW 43.53 hours 45 minutes ?2K 2 CA bath ?No heparin ?Hectorol 4 mics ?Mircera last given 07/24/21 100 mcg  ?Right IJ PermCath,(has refused other access alternatives) ?  ?Assessment/Plan ?Hyperkalemia 2/2 missed HD= s/p HD and now K2.9 ?ESRD -with uremia BUN 116, creatinine 19.7 ,2/2 missed HD x3, HD TTS -> tolerated HD 3/15 early AM with 3L net UF; plan next HD for Thur if she's still here.  ?Hypertension/volume  -hypotension in ER secondary to missed HD no current chest x-ray yet no significant excess volume on exam plan for 2 to 3 L UF on HD(her normal UF as OP) continue BP meds amlodipine 10 mg daily clonidine 0.1 mg patch q. Saturday hydralazine 50 mg 3 times daily Isordil 40 mg daily losartan 100 mg daily follow-up BP trend with UF, might be able to taper BP meds down with UF. She has h/o terrible [place] with oral meds hence the clonidine patch; confirmed that she is on the 0.1/24hr qweekly patch. Supposed to be applied every Saturday. ?Anemia  -HGB 11.1, is due ESA but will hold for now follow-up Hgb trend ?Metabolic bone disease -Velphoro as phosphate binder, continue vitamin D on dialysis, calcium 9.3 ?Nutrition -renal diet with fluid restrictions renal vitamin ? ?Subjective:   ?Main complaint is headache; denies blurry vision.  ? ?Objective:   ?BP (!) 194/104   Pulse 63   Temp 98.4 ?F (36.9 ?C)   Resp 17   Wt 43 kg   SpO2 99%   BMI 16.79 kg/m?   ? ?Intake/Output Summary (Last 24 hours) at 08/13/2021 0807 ?Last data filed at 08/13/2021 0505 ?Gross per 24 hour  ?Intake 500 ml  ?Output 3000 ml  ?Net -2500 ml  ? ?Weight change:  ? ?Physical Exam: ?General: Alert, thin, chronically ill-appearing female ?Neck: Supple no JVD appreciated ?Heart: RRR 1/6 SEM  ?Lungs: Decreased BS at bases  ?Abdomen: NABS, S, NTND ?Extremities: Trace LE edema ?Neuro: AOx3, no asterixis ?Dialysis Access: Right IJ PermCath dressing off partially no no discharge or tenderness ? ?Imaging: ?CT HEAD WO CONTRAST (5MM) ? ?Result Date: 08/12/2021 ?CLINICAL DATA:  Mental status change, unknown cause.  Dizziness. EXAM: CT HEAD WITHOUT CONTRAST TECHNIQUE: Contiguous axial images were obtained from the base of the skull through the vertex without intravenous contrast. RADIATION DOSE REDUCTION: This exam was performed according to the departmental dose-optimization program which includes automated exposure control, adjustment of the mA and/or kV according to patient size and/or use of iterative reconstruction technique. COMPARISON:  06/07/2021. FINDINGS: Brain: No acute intracranial hemorrhage, midline shift or mass effect. No extra-axial fluid collection. Mild atrophy is noted. Subcortical and periventricular white matter hypodensities are seen bilaterally. No hydrocephalus. Vascular: Atherosclerotic calcification of the carotid siphons. No hyperdense vessel. Skull: Normal. Negative for fracture or focal lesion. Sinuses/Orbits: No acute finding. Other: None. IMPRESSION: 1. No acute intracranial process. 2. Atrophy with extensive chronic microvascular ischemic changes.  Electronically Signed   By: Brett Fairy M.D.   On: 08/12/2021 20:03  ? ?DG Chest Port 1 View ? ?Result Date: 08/12/2021 ?CLINICAL DATA:  Shortness of breath for 1 week, missed hemodialysis, former smoker EXAM: PORTABLE CHEST 1 VIEW COMPARISON:  Portable exam 1512 hours compared to 07/15/2021 FINDINGS: LEFT jugular dual-lumen central  venous catheter with tip in RIGHT atrium. Enlargement of cardiac silhouette post median sternotomy with pulmonary vascular congestion. Atherosclerotic calcification aorta. RIGHT basilar opacity question atelectasis versus infiltrate. Mild subsegmental atelectasis LEFT mid lung. No pleural effusion or pneumothorax. IMPRESSION: Enlargement of cardiac silhouette with pulmonary vascular congestion. Atelectasis versus infiltrate at RIGHT base with mild subsegmental atelectasis at lingula. Electronically Signed   By: Lavonia Dana M.D.   On: 08/12/2021 15:25   ? ?Labs: ?BMET ?Recent Labs  ?Lab 08/12/21 ?1209 08/12/21 ?2034 08/13/21 ?0019  ?NA 140 142 137  ?K 6.7* 5.2* 2.9*  ?CL 100 98 98  ?CO2 21* 21* 28  ?GLUCOSE 116* 182* 99  ?BUN 155* 150* 39*  ?CREATININE 19.69* 19.09* 6.72*  ?CALCIUM 9.3 9.0 8.0*  ? ?CBC ?Recent Labs  ?Lab 08/12/21 ?1209  ?WBC 4.9  ?HGB 11.1*  ?HCT 34.4*  ?MCV 94.5  ?PLT 240  ? ? ?Medications:   ? ? amLODipine  10 mg Oral Daily  ? apixaban  2.5 mg Oral BID  ? Chlorhexidine Gluconate Cloth  6 each Topical Daily  ? [START ON 08/14/2021] doxercalciferol  4 mcg Intravenous Q T,Th,Sa-HD  ? hydrALAZINE  50 mg Oral Q8H  ? isosorbide dinitrate  40 mg Oral TID  ? losartan  100 mg Oral Daily  ? mirtazapine  7.5 mg Oral QHS  ? rosuvastatin  10 mg Oral Daily  ? ? ? ? ?Otelia [place], MD ?08/13/2021, 8:07 AM  ? ?

## 2021-08-13 NOTE — Progress Notes (Signed)
NEW ADMISSION NOTE ?New Admission Note:  ?  ?Arrival Method: Stretcher ?Mental Orientation: Altered mental status ?Telemetry:# 10 ?Assessment: Initiated ?Skin: Intact ?IV: Rt upper Double lumen HD cath, 20g LFA &RFA ?Pain: 8/10 Head ache ?Tubes: None ?Safety Measures: Low bed, Bed alarm on ?Admission: Initiated ?[facility] [place] Orientation: Patient has been oriented to the room, unit and staff.  ?Family: Notified  ? ?Patient arrived with unstable blood pressure, see chart. She adamantly refused morning Labs, was uncooperative with staff for assessment. Pt also refused to be transferred as ordered by the Intern.  ? ?Orders have been reviewed and implemented. Will continue to monitor the patient. Call light has been placed within reach and bed alarm has been activated.  ?

## 2021-08-13 NOTE — Progress Notes (Addendum)
FPTS Interim Night Progress Note ? ?S:Patient was just returning from HD.  Spoke with HD RN who reported that patient had complained of headache and blood pressure elevating during dialysis.  She had notified Nephro on call who ordered Oxycontin.  Pressure continued to rise and was then ordered Clonidine 0.2 mg.  Spoke with primary RN on 76M who reports that patient continues to have rise in BP after receiving Clonidine and headache remains.  Home BP medications had not been ordered on admission.   ? ?O: ?Today's Vitals  ? 08/13/21 0215 08/13/21 0309 08/13/21 0332 08/13/21 0350  ?BP: (!) 201/130 (!) 206/128 (!) 220/122   ?Pulse: 66  65   ?Resp: 18  20   ?Temp: 98 ?F (36.7 ?C)  97.6 ?F (36.4 ?C)   ?TempSrc: Oral  Oral   ?SpO2: 100%  95%   ?Weight: 43 kg     ?PainSc: 5    8   ? ? ? ? ?A/P: ?Hypertension in setting of ESRD ?Restart home medications ?Continue to hold Losartan in setting of hyperkalemia ?Hydralazine 5 mg IVP x 1, if remains >220  ?UDS positive for Cocaine, avoid BB ? ? ?Carollee Leitz MD ?PGY-3, [facility] ?Service pager [phone]   ?

## 2021-08-13 NOTE — Progress Notes (Signed)
Family Medicine Teaching Service ?Daily Progress Note ?Intern Pager: [phone] ? ?Patient name: Monique Henderson Medical record number: [id number] ?Date of birth: 1951/04/21 Age: 71 y.o. Gender: female ? ?Primary Care Provider: Myrtie Neither, PA-C ?Consultants: Nephrology ?Code Status: Full ? ?Pt Overview and Major Events to Date:  ?3/14 admitted ? ?Assessment and Plan: ?Monique Henderson is a 71 y.o. female presenting with 1 week of dizziness and missing HD for 1 week. 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 ? ?Uremic Encephalopathy ?ESRD on HD TTS ?Hyperkalemia ?Less confused and more alert today. Unable to clarify why she did not go to dialysis for HD. Head CT shows atrophy with extensive chronic microvascular ischemic changes. Cr 19.09>6.72>8.79. Has a clonidine patch 0.1 on that she states she put on yesterday. Poor med compliance. Nephro plans to do dialysis tomorrow. UDS positive for cocaine. She adamantly denies this but claims that the "pill" she got from her friend might have been cocaine (?). Head CT shows significant atrophy but no bleed.  ?-Nephrology consulted, greatly appreciate assistance ?-Vitals per floor ?-Continue to monitor mental status ?-Nephro will monitor K and control with HD as needed ?-On nephro diet ? ?Chest pain ?Patient endorses right sided pain that moves to the center of her chest for the past week. Unable to stay awake to give additional history. Does have a history of aortic dissection, will monitor vitals closely. Trop 183>176 ?- cardiac monitoring  ? ?Uncontrolled Hypertension secondary to Medication noncompliance ?Blood pressure from 190/108 to 147/91.  Resumed home meds this morning.  Discussed importance of medication compliance; however, patient appears still very lethargic and unclear whether patient fully understands the  deleterious effects of persistent hypertension especially when patient is regularly systolic over 704U. ?-Clonidine patch ?-Amlodipine 10 mg daily ?-Hydralazine 50 mg 3 times daily ?-PT recommending SNF. TOC consult made for SNF placement ? ?Depression with passive suicidal ideation ?Patient endorses having passive suicidal ideation for past several weeks.  Patient has no intent or plan but reports severe stressors including unable to make car payments, difficulty making HD sessions, and living alone. ?-We will continue to monitor ?-This will likely improve with SNF placement per PT ? ?Chronic Anemia ?Likely secondary to ESRD. Hgb 11.1 ?-Monitor trend and can get ESA per nephro ?  ?COPD ?Home med: albuterol q6 hour PRN. On 2L oxygen. ?-Albuterol available PRN ?  ?HLD ?Home med Crestor 10 mg ?-Resume home med ?  ?PAF with RVR ?Home med: Eliquis 2.5 mg bid/Warfarin.  Patient was at some point switch from Eliquis to warfarin in January but does not appear to get regular follow-up for warfarin.  We will switch back to Eliquis and monitor for patient's reported side effect of itching.  Given patient's difficulties with medication compliance, it would be much safer for patient to be on Eliquis than warfarin. ?-Continue Eliquis 2.5 mg bid ? ?FEN/GI: Regular/Thin Liquid ?PPx: Eliquis ?Dispo:SNF in 2-3 days. Barriers include ongoing medical therapy.  ? ?Subjective:  ?Patient was seen and assessed at bedside.  Patient reports feeling more alert than yesterday.  Patient still lethargic.  Discussed home medication regiment for hypertension and patient reports that she takes some of her meds in the morning but is extremely inconsistent with any other meds especially if they are taking more than once a day. ? ?Objective: ?Temp:  [97.5 ?F (36.4 ?C)-98.4 ?F (36.9 ?C)] 97.6 ?  F (36.4 ?C) (03/15 2831) ?Pulse Rate:  [50-69] 58 (03/15 0843) ?Resp:  [12-20] 16 (03/15 0843) ?BP: (173-220)/(88-130) 190/108 (03/15 0843) ?SpO2:  [91 %-100 %]  100 % (03/15 0843) ?Weight:  [43 kg-46 kg] 43 kg (03/15 0215) ?Physical Exam: ?General: Lethargic but NAD resting in bed.  Appears more alert than yesterday ?Cardiovascular: RRR ?Respiratory: Decreased at bases bilaterally.  On 2 L nasal cannula ?Abdomen: Soft nontender.  Has clonidine patch on chest. ?Extremities: Able to move all extremities but generally hypotonic ? ?Laboratory: ?Recent Labs  ?Lab 08/12/21 ?1209  ?WBC 4.9  ?HGB 11.1*  ?HCT 34.4*  ?PLT 240  ? ?Recent Labs  ?Lab 08/12/21 ?2034 08/13/21 ?5176 08/13/21 ?0732  ?NA 142 137 136  ?K 5.2* 2.9* 3.9  ?CL 98 98 96*  ?CO2 21* 28 25  ?BUN 150* 39* 47*  ?CREATININE 19.09* 6.72* 8.79*  ?CALCIUM 9.0 8.0* 8.2*  ?PROT  --  6.5  --   ?BILITOT  --  0.4  --   ?ALKPHOS  --  117  --   ?ALT  --  24  --   ?AST  --  18  --   ?GLUCOSE 182* 99 109*  ? ? ? ? ?Imaging/Diagnostic Tests: ?CT HEAD WO CONTRAST (5MM) ? ?Result Date: 08/12/2021 ?CLINICAL DATA:  Mental status change, unknown cause.  Dizziness. EXAM: CT HEAD WITHOUT CONTRAST TECHNIQUE: Contiguous axial images were obtained from the base of the skull through the vertex without intravenous contrast. RADIATION DOSE REDUCTION: This exam was performed according to the departmental dose-optimization program which includes automated exposure control, adjustment of the mA and/or kV according to patient size and/or use of iterative reconstruction technique. COMPARISON:  06/07/2021. FINDINGS: Brain: No acute intracranial hemorrhage, midline shift or mass effect. No extra-axial fluid collection. Mild atrophy is noted. Subcortical and periventricular white matter hypodensities are seen bilaterally. No hydrocephalus. Vascular: Atherosclerotic calcification of the carotid siphons. No hyperdense vessel. Skull: Normal. Negative for fracture or focal lesion. Sinuses/Orbits: No acute finding. Other: None. IMPRESSION: 1. No acute intracranial process. 2. Atrophy with extensive chronic microvascular ischemic changes. Electronically Signed    By: Brett Fairy M.D.   On: 08/12/2021 20:03  ? ?DG Chest Port 1 View ? ?Result Date: 08/12/2021 ?CLINICAL DATA:  Shortness of breath for 1 week, missed hemodialysis, former smoker EXAM: PORTABLE CHEST 1 VIEW COMPARISON:  Portable exam 1512 hours compared to 07/15/2021 FINDINGS: LEFT jugular dual-lumen central venous catheter with tip in RIGHT atrium. Enlargement of cardiac silhouette post median sternotomy with pulmonary vascular congestion. Atherosclerotic calcification aorta. RIGHT basilar opacity question atelectasis versus infiltrate. Mild subsegmental atelectasis LEFT mid lung. No pleural effusion or pneumothorax. IMPRESSION: Enlargement of cardiac silhouette with pulmonary vascular congestion. Atelectasis versus infiltrate at RIGHT base with mild subsegmental atelectasis at lingula. Electronically Signed   By: Lavonia Dana M.D.   On: 08/12/2021 15:25   ? ?France Ravens, MD ?08/13/2021, 9:22 AM ?PGY-1, [facility] ?[place] Intern pager: [phone], text pages welcome ?

## 2021-08-14 LAB — RENAL FUNCTION PANEL
Albumin: 2.8 g/dL — ABNORMAL LOW (ref 3.5–5.0)
Anion gap: 14 (ref 5–15)
BUN: 58 mg/dL — ABNORMAL HIGH (ref 8–23)
CO2: 24 mmol/L (ref 22–32)
Calcium: 8.4 mg/dL — ABNORMAL LOW (ref 8.9–10.3)
Chloride: 98 mmol/L (ref 98–111)
Creatinine, Ser: 10.52 mg/dL — ABNORMAL HIGH (ref 0.44–1.00)
GFR, Estimated: 4 mL/min — ABNORMAL LOW (ref >60)
Glucose, Bld: 95 mg/dL (ref 70–99)
Phosphorus: 7.4 mg/dL — ABNORMAL HIGH (ref 2.5–4.6)
Potassium: 4.4 mmol/L (ref 3.5–5.1)
Sodium: 136 mmol/L (ref 135–145)

## 2021-08-14 LAB — CBC
HCT: 31.7 % — ABNORMAL LOW (ref 36.0–46.0)
Hemoglobin: 10.2 g/dL — ABNORMAL LOW (ref 12.0–15.0)
MCH: 29.8 pg (ref 26.0–34.0)
MCHC: 32.2 g/dL (ref 30.0–36.0)
MCV: 92.7 fL (ref 80.0–100.0)
Platelets: 181 10*3/uL (ref 150–400)
RBC: 3.42 MIL/uL — ABNORMAL LOW (ref 3.87–5.11)
RDW: 16.1 % — ABNORMAL HIGH (ref 11.5–15.5)
WBC: 5.2 10*3/uL (ref 4.0–10.5)
nRBC: 0 % (ref 0.0–0.2)

## 2021-08-14 LAB — HEPATITIS B SURFACE ANTIBODY, QUANTITATIVE: Hep B S AB Quant (Post): 75.4 m[IU]/mL (ref >9.9)

## 2021-08-14 LAB — MRSA NEXT GEN BY PCR, NASAL: MRSA by PCR Next Gen: NOT DETECTED

## 2021-08-14 MED ORDER — ACETAMINOPHEN 500 MG PO TABS
1000.0000 mg | ORAL_TABLET | Freq: Four times a day (QID) | ORAL | Status: DC | PRN
  Administered 2021-08-14 – 2021-08-15 (×3): 1000 mg via ORAL
  Filled 2021-08-14 (×3): qty 2

## 2021-08-14 MED ORDER — HEPARIN SODIUM (PORCINE) 1000 UNIT/ML IJ SOLN
INTRAMUSCULAR | Status: AC
  Administered 2021-08-14: 1000 [IU]
  Filled 2021-08-14: qty 4

## 2021-08-14 MED ORDER — ASPIRIN EC 325 MG PO TBEC
325.0000 mg | DELAYED_RELEASE_TABLET | Freq: Once | ORAL | Status: AC
  Administered 2021-08-14: 325 mg via ORAL
  Filled 2021-08-14: qty 1

## 2021-08-14 NOTE — Progress Notes (Signed)
Pt receives out-pt HD at [facility]) on TTS. Pt arrives at 11:40 for 12:00 chair time. Per CSW note, pt has declined snf and plans to return home at d/c. Will assist as needed.  ? ?Monique Henderson ?Renal Navigator ?[phone] ?

## 2021-08-14 NOTE — Progress Notes (Signed)
Patient refused midnight vital signs citing concern for being cold and tired. Notified physician. Will attempt again when next eligible for pain medication. Warm blanket offered to and accepted by patient. ?

## 2021-08-14 NOTE — Progress Notes (Signed)
Family Medicine Teaching Service ?Daily Progress Note ?Intern Pager: [phone] ? ?Patient name: Monique Henderson Medical record number: [id number] ?Date of birth: 1950-11-09 Age: 71 y.o. Gender: female ? ?Primary Care Provider: Myrtie Neither, PA-C ?Consultants: Nephrology ?Code Status: Full ? ?Pt Overview and Major Events to Date:  ?08/12/21 admitted ? ?Assessment and Plan: ?Monique Henderson is a 71 y.o. female presenting with 1 week of dizziness and missing HD for 1 week. 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 ? ?Uremic Encephalopathy ?ESRD on HD TTS ?Hyperkalemia, resolved ?K 3.9 > 4.4. Cr 8.79>10.52. BUN 47>58. On clonidine patch. Had 3 L off from HD on Tuesday. Patient was irritable and initially refused HD. She had thought she had gotten HD yesterday so refused today's hemodialysis.  Clarified this and patient agreeable to HD.  She is at HD today.  Patient appears irritable. ?-Nephro consulted, greatly appreciate assistance ?-Continue to monitor mental status ? ?Uncontrolled Hypertension secondary to Medication noncompliance ?Blood pressure SBP ranging from 120s-140s.  Ran high today due to missing Isordil and hydralazine morning dose.  Home meds appear to control her BP well.  Discussed importance of medication compliance.  Reported headache last night which improved with Tylenol.  Patient has possible allergy to Tylenol related to itching but denies itching. ?-Clonidine patch ?-Amlodipine 10 mg daily ?-Hydralazine 50 mg 3 times daily ?-PT recommending SNF.  ?-Patient refusing SNF wants to go home. ?-High risk for readmission given medication noncompliance. Will attempt to get patient a pill box to make it easier for her to take medications. ? ?Chest pain, resolved ?Denies chest pain today.  ?-monitoring ? ?Chronic Anemia ?Likely secondary to ESRD. Hgb:  11.1>10.2 ?-Defer ESA to Nephro ? ?Depression, improving ?Appears chronic. No acute concerns at this time. Multiple life stressors.  Denies SI/HI/AVH. ?-Will continue to monitor ? ?COPD ?Home med: albuterol q6 hour PRN. On 2L oxygen. ?-Albuterol available PRN ?  ?HLD ?Home med Crestor 10 mg ?-Resume home med ? ?PAF with RVR ?Home med: Eliquis 2.5 mg bid/Warfarin (?). Denies itching today. ?-Continue Eliquis 2.5 mg bid.  ? ?FEN/GI: Regular/Thin Liquid ?PPx: Eliquis ?Dispo: Home with home health PT/OT ? ?Subjective:  ?Patient very irritable this morning.  Initially refused HD today because she thought she had yesterday.  Clarified this and she is agreeable to having HD today.  Patient denies depressive symptoms or suicidal ideation today.  However, patient is refusing SNF and wants to go home. ? ?Objective: ?Temp:  [97.5 ?F (36.4 ?C)-98.7 ?F (37.1 ?C)] 97.7 ?F (36.5 ?C) (03/16 0329) ?Pulse Rate:  [58-71] 71 (03/15 1958) ?Resp:  [15-17] 15 (03/16 0329) ?BP: (128-190)/(75-108) 148/76 (03/16 0329) ?SpO2:  [96 %-100 %] 96 % (03/16 0329) ?Weight:  [46.5 kg-47.2 kg] 47.2 kg (03/16 0329) ?Physical Exam: ?General: NAD. Irritable but resting comfortably ?Cardiovascular: RRR ?Respiratory: CTAB ?Abdomen: soft, nontender ?Extremities: move all extremities ? ?Laboratory: ?Recent Labs  ?Lab 08/12/21 ?1209 08/14/21 ?0420  ?WBC 4.9 5.2  ?HGB 11.1* 10.2*  ?HCT 34.4* 31.7*  ?PLT 240 181  ? ?Recent Labs  ?Lab 08/13/21 ?0019 08/13/21 ?0732 08/14/21 ?0420  ?NA 137 136 136  ?K 2.9* 3.9 4.4  ?CL 98 96* 98  ?CO2 '28 25 24  '$ ?BUN 39* 47* 58*  ?CREATININE 6.72* 8.79* 10.52*  ?CALCIUM 8.0* 8.2* 8.4*  ?PROT 6.5  --   --   ?BILITOT 0.4  --   --   ?  ALKPHOS 117  --   --   ?ALT 24  --   --   ?AST 18  --   --   ?GLUCOSE 99 109* 95  ? ? ? ? ?Imaging/Diagnostic Tests: ?No results found. No new ? ?France Ravens, MD ?08/14/2021, 6:53 AM ?PGY-1, [place] ?[place] Intern pager: [phone], text pages welcome ?

## 2021-08-14 NOTE — Progress Notes (Signed)
FPTS Interim Night Progress Note ? ?S: Patient was resting comfortably when I went to the room.  Did not disturb.  No new orders needed at this time. ?O: ?Today's Vitals  ? 08/14/21 1942 08/14/21 2046 08/14/21 2145 08/14/21 2222  ?BP:  (!) 164/95 (!) 179/110 (!) 153/86  ?Pulse:  84 79   ?Resp:  19 20   ?Temp:  100.1 ?F (37.8 ?C) 98.9 ?F (37.2 ?C)   ?TempSrc:  Oral Oral   ?SpO2:  94% 99%   ?Weight:      ?Height:      ?PainSc: 0-No pain  10-Worst pain ever   ? ? ? ? ?A/P: ?Continue current plan with no changes needed at this time ? ?Gifford Shave, MD ?PGY-3, [facility] ?Service pager [phone]   ?

## 2021-08-14 NOTE — Progress Notes (Signed)
FPTS Interim Night Progress Note ? ?S:Patient sleeping comfortably.  Rounded with primary night RN. Headache improved when given tylenol.  No concerns voiced.  No orders required.   ? ?O: ?Today's Vitals  ? 08/13/21 1513 08/13/21 1743 08/13/21 1958 08/13/21 2115  ?BP: 139/75 128/77 131/87   ?Pulse: 65 64 71   ?Resp: '16 17 17   '$ ?Temp: (!) 97.5 ?F (36.4 ?C) 97.7 ?F (36.5 ?C) 98.7 ?F (37.1 ?C)   ?TempSrc: Oral Oral Oral   ?SpO2: 98% 97% 97%   ?Weight:  46.5 kg    ?Height:  '5\' 3"'$  (1.6 m)    ?PainSc: 0-No pain   0-No pain  ? ? ? ? ?A/P: ?Continue current management ? ?Carollee Leitz MD ?PGY-3, [facility] ?Service pager [phone]   ?

## 2021-08-14 NOTE — Progress Notes (Signed)
?[facility] ?Progress Note  ? ?71 y.o. female.  With ESRD   HD dep (HD TTS [facility] )since 04/06/19 after aortic dissection and repair-, P A-fib also COPD tobacco disorder, hypertension, GERD, and hepatitis C. ?Now presents to the ER complaining of hurting all over and short of breath.  She has missed her last 3 outpatient dialysis, last dialysis 08/02/21.  Pt took a pill of unknown type Friday from friend who thought it was fentanyl since then was dizzy and did not feel good ?  ?Assessment/ Plan:   ?Dialysis Orders: Center: [facility] TTS EDW 43.53 hours 45 minutes ?2K 2 CA bath ?No heparin ?Hectorol 4 mics ?Mircera last given 07/24/21 100 mcg  ?Right IJ PermCath,(has refused other access alternatives) ?  ?Assessment/Plan ?Hyperkalemia 2/2 missed HD= s/p HD and now K4.4 ?ESRD -with uremia BUN 116, creatinine 19.7 ,2/2 missed HD x3, HD TTS -> tolerated HD 3/15 early AM with 3L net UF. ? ?Seen on HD after initially refusing ?3K bath RIJ TC ?185/91 2L net UF as tolerated ?She is mentating well; encouraged her as she was still not convinced she wanted to be at dialysis. ? ?Next HD Sat. ? ?Hypertension/volume  -hypotension in ER secondary to missed HD no current chest x-ray yet no significant excess volume on exam plan for 2 to 3 L UF on HD(her normal UF as OP) continue BP meds amlodipine 10 mg daily clonidine 0.1 mg patch q. Saturday hydralazine 50 mg 3 times daily Isordil 40 mg daily losartan 100 mg daily follow-up BP trend with UF, might be able to taper BP meds down with UF. She has h/o terrible [place] with oral meds hence the clonidine patch; confirmed that she is on the 0.1/24hr qweekly patch. Supposed to be applied every Saturday. ?Anemia  -HGB 11.1, is due ESA but will hold for now follow-up Hgb trend ?Metabolic bone disease -Velphoro as phosphate binder, continue vitamin D on dialysis, calcium 9.3 ?Nutrition -renal diet with fluid restrictions renal vitamin ? ?Subjective:   ?Main complaint is  headache. Denies f/c/n/v/ dyspnea/ CP.  ? ?Objective:   ?BP (!) 180/91 (BP Location: Left Arm)   Pulse 72   Temp 98.3 ?F (36.8 ?C) (Oral)   Resp 18   Ht '5\' 3"'$  (1.6 m)   Wt 47.2 kg   SpO2 92%   BMI 18.43 kg/m?  ? ?Intake/Output Summary (Last 24 hours) at 08/14/2021 0929 ?Last data filed at 08/13/2021 1730 ?Gross per 24 hour  ?Intake 120 ml  ?Output --  ?Net 120 ml  ? ?Weight change: 0.5 kg ? ?Physical Exam: ?General: Alert, thin, chronically ill-appearing female ?Neck: Supple no JVD appreciated ?Heart: RRR 1/6 SEM  ?Lungs: Decreased BS at bases  ?Abdomen: NABS, S, NTND ?Extremities: Trace LE edema ?Neuro: AOx3, no asterixis ?Dialysis Access: Right IJ PermCath dressing off partially no no discharge or tenderness ? ?Imaging: ?CT HEAD WO CONTRAST (5MM) ? ?Result Date: 08/12/2021 ?CLINICAL DATA:  Mental status change, unknown cause.  Dizziness. EXAM: CT HEAD WITHOUT CONTRAST TECHNIQUE: Contiguous axial images were obtained from the base of the skull through the vertex without intravenous contrast. RADIATION DOSE REDUCTION: This exam was performed according to the departmental dose-optimization program which includes automated exposure control, adjustment of the mA and/or kV according to patient size and/or use of iterative reconstruction technique. COMPARISON:  06/07/2021. FINDINGS: Brain: No acute intracranial hemorrhage, midline shift or mass effect. No extra-axial fluid collection. Mild atrophy is noted. Subcortical and periventricular white matter hypodensities  are seen bilaterally. No hydrocephalus. Vascular: Atherosclerotic calcification of the carotid siphons. No hyperdense vessel. Skull: Normal. Negative for fracture or focal lesion. Sinuses/Orbits: No acute finding. Other: None. IMPRESSION: 1. No acute intracranial process. 2. Atrophy with extensive chronic microvascular ischemic changes. Electronically Signed   By: Brett Fairy M.D.   On: 08/12/2021 20:03  ? ?DG Chest Port 1 View ? ?Result Date:  08/12/2021 ?CLINICAL DATA:  Shortness of breath for 1 week, missed hemodialysis, former smoker EXAM: PORTABLE CHEST 1 VIEW COMPARISON:  Portable exam 1512 hours compared to 07/15/2021 FINDINGS: LEFT jugular dual-lumen central venous catheter with tip in RIGHT atrium. Enlargement of cardiac silhouette post median sternotomy with pulmonary vascular congestion. Atherosclerotic calcification aorta. RIGHT basilar opacity question atelectasis versus infiltrate. Mild subsegmental atelectasis LEFT mid lung. No pleural effusion or pneumothorax. IMPRESSION: Enlargement of cardiac silhouette with pulmonary vascular congestion. Atelectasis versus infiltrate at RIGHT base with mild subsegmental atelectasis at lingula. Electronically Signed   By: Lavonia Dana M.D.   On: 08/12/2021 15:25   ? ?Labs: ?BMET ?Recent Labs  ?Lab 08/12/21 ?1209 08/12/21 ?2034 08/13/21 ?0019 08/13/21 ?0732 08/14/21 ?0420  ?NA 140 142 137 136 136  ?K 6.7* 5.2* 2.9* 3.9 4.4  ?CL 100 98 98 96* 98  ?CO2 21* 21* '28 25 24  '$ ?GLUCOSE 116* 182* 99 109* 95  ?BUN 155* 150* 39* 47* 58*  ?CREATININE 19.69* 19.09* 6.72* 8.79* 10.52*  ?CALCIUM 9.3 9.0 8.0* 8.2* 8.4*  ?PHOS  --   --   --   --  7.4*  ? ?CBC ?Recent Labs  ?Lab 08/12/21 ?1209 08/14/21 ?0420  ?WBC 4.9 5.2  ?HGB 11.1* 10.2*  ?HCT 34.4* 31.7*  ?MCV 94.5 92.7  ?PLT 240 181  ? ? ?Medications:   ? ? amLODipine  10 mg Oral Daily  ? apixaban  2.5 mg Oral BID  ? Chlorhexidine Gluconate Cloth  6 each Topical Daily  ? doxercalciferol  4 mcg Intravenous Q T,Th,Sa-HD  ? hydrALAZINE  50 mg Oral Q8H  ? isosorbide dinitrate  40 mg Oral TID  ? losartan  100 mg Oral Daily  ? mirtazapine  7.5 mg Oral QHS  ? rosuvastatin  10 mg Oral Daily  ? ? ? ? ?Otelia Santee, MD ?08/14/2021, 9:29 AM  ? ?

## 2021-08-14 NOTE — Progress Notes (Signed)
?   08/14/21 2145  ?Vitals  ?Temp 98.9 ?F (37.2 ?C)  ?Temp Source Oral  ?BP (!) 179/110  ?MAP (mmHg) 127  ?Pulse Rate 79  ?ECG Heart Rate 81  ?Resp 20  ?Oxygen Therapy  ?SpO2 99 %  ?O2 Device Room Air  ?Pain Assessment  ?Pain Scale 0-10  ?Pain Score 10  ?Pain Type Acute pain  ?Pain Location Head  ?Pain Descriptors / Indicators Burning  ?Pain Frequency Constant  ?Pain Onset Awakened from sleep  ?Pain Intervention(s) MD notified (Comment);Medication (See eMAR)  ? ?Patient awakened from sleep with 10/10 head pain that she describes as burning all the way across the front of her forehead.  BP high, scheduled meds given.  MD notified.  ?

## 2021-08-14 NOTE — Progress Notes (Signed)
Central monitor called pt. Have 7 beats of V.Tach pt. In dialysis at the time. MD notified, no new order given. Will continue to monitor. ?

## 2021-08-14 NOTE — Progress Notes (Signed)
Occupational Therapy Treatment ?Patient Details ?Name: Monique Henderson ?MRN: [id number] ?DOB: 08-Aug-1950 ?Today's Date: 08/14/2021 ? ? ?History of present illness 71 y.o. female admitted 08/12/21 presenting with 1 week of AMS, dizziness and missing HD for 1 week; chest pain, SOB, and potential drug use (fentanyl). 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  ?OT comments ? Patient received in supine and patient was reluctant to participate due to lethargic from taking HD earlier today. Patietn was agreeable due to need to use BSC. Patient was supervision to get to EOB and stood from EOB with use of rail and min assist to transfer to [facility] and patient was able to perform toilet hygiene seated.  Patient performed hand hand face hygiene seated on EOB and asked to end session. Acute OT to continue to follow.   ? ?Recommendations for follow up therapy are one component of a multi-disciplinary discharge planning process, led by the attending physician.  Recommendations may be updated based on patient status, additional functional criteria and insurance authorization. ?   ?Follow Up Recommendations ? Skilled nursing-short term rehab (<3 hours/day)  ?  ?Assistance Recommended at Discharge Frequent or constant Supervision/Assistance  ?Patient can return home with the following ? A lot of help with walking and/or transfers;A lot of help with bathing/dressing/bathroom;Assistance with cooking/housework;Assistance with feeding;Direct supervision/assist for medications management;Direct supervision/assist for financial management;Assist for transportation;Help with stairs or ramp for entrance ?  ?Equipment Recommendations ? Other (comment) (TBD)  ?  ?Recommendations for Other Services   ? ?  ?Precautions / Restrictions Precautions ?Precautions: Fall ?Restrictions ?Weight Bearing  Restrictions: No  ? ? ?  ? ?Mobility Bed Mobility ?Overal bed mobility: Needs Assistance ?Bed Mobility: Supine to Sit, Sit to Supine ?  ?  ?Supine to sit: Supervision, HOB elevated ?Sit to supine: Supervision ?  ?General bed mobility comments: supervision for safety and verbal cues for rail use ?  ? ?Transfers ?Overall transfer level: Needs assistance ?Equipment used: None ?Transfers: Sit to/from Stand, Bed to chair/wheelchair/BSC ?Sit to Stand: Min assist ?Stand pivot transfers: Min assist ?  ?  ?  ?  ?General transfer comment: performed transfer from EOB to [facility] and back with min assist for safety ?  ?  ?Balance Overall balance assessment: Needs assistance ?Sitting-balance support: No upper extremity supported, Feet supported ?Sitting balance-Leahy Scale: Fair ?Sitting balance - Comments: able to sit on EOB without assistance ?  ?Standing balance support: Single extremity supported, During functional activity ?Standing balance-Leahy Scale: Poor ?Standing balance comment: stood from EOB with use of rail for support before and after transfers to [facility] ?  ?  ?  ?  ?  ?  ?  ?  ?  ?  ?  ?   ? ?ADL either performed or assessed with clinical judgement  ? ?ADL Overall ADL's : Needs assistance/impaired ?  ?  ?Grooming: Wash/dry hands;Wash/dry face;Supervision/safety;Sitting ?Grooming Details (indicate cue type and reason): performed seated on EOB ?  ?  ?  ?  ?  ?  ?  ?  ?Toilet Transfer: Minimal assistance;Stand-pivot;BSC/3in1 ?Toilet Transfer Details (indicate cue type and reason): performed transfer from EOB to [facility] with min assist for safety ?Toileting- Clothing Manipulation and Hygiene: Supervision/safety;Sitting/lateral lean ?Toileting - Clothing Manipulation Details (indicate cue type and reason): patient performed toilet hygiene seated ?  ?  ?  ?General ADL Comments: patient lethargic due to have  had HD earlier today.  Relunctant towards therapy initially until she stated she needed to use BSC. ?  ? ?Extremity/Trunk  Assessment   ?  ?  ?  ?  ?  ? ?Vision   ?  ?  ?Perception   ?  ?Praxis   ?  ? ?Cognition Arousal/Alertness: Lethargic ?Behavior During Therapy: Flat affect ?Overall Cognitive Status: Impaired/Different from baseline ?Area of Impairment: Attention, Memory, Following commands, Safety/judgement, Problem solving, Awareness ?  ?  ?  ?  ?  ?  ?  ?  ?  ?Current Attention Level: Sustained ?Memory: Decreased short-term memory ?Following Commands: Follows one step commands inconsistently ?Safety/Judgement: Decreased awareness of safety ?Awareness: Intellectual ?Problem Solving: Slow processing, Decreased initiation, Difficulty sequencing, Requires verbal cues, Requires tactile cues ?General Comments: relunctant towards therapy initially. required frequent cues for safety ?  ?  ?   ?Exercises   ? ?  ?Shoulder Instructions   ? ? ?  ?General Comments    ? ? ?Pertinent Vitals/ Pain       Pain Assessment ?Pain Assessment: No/denies pain ? ?Home Living   ?  ?  ?  ?  ?  ?  ?  ?  ?  ?  ?  ?  ?  ?  ?  ?  ?  ?  ? ?  ?Prior Functioning/Environment    ?  ?  ?  ?   ? ?Frequency ? Min 2X/week  ? ? ? ? ?  ?Progress Toward Goals ? ?OT Goals(current goals can now be found in the care plan section) ? Progress towards OT goals: Progressing toward goals ? ?Acute Rehab OT Goals ?OT Goal Formulation: Patient unable to participate in goal setting ?Time For Goal Achievement: 08/27/21 ?Potential to Achieve Goals: Good ?ADL Goals ?Pt Will Perform Grooming: with supervision;standing ?Pt Will Perform Upper Body Dressing: with set-up;sitting ?Pt Will Perform Lower Body Dressing: with supervision;sit to/from stand ?Pt Will Transfer to Toilet: with supervision;ambulating ?Pt Will Perform Toileting - Clothing Manipulation and hygiene: with supervision;sit to/from stand ?Additional ADL Goal #1: Pt will follow one step commands 90% of the time to perform ADL  ?Plan Discharge plan remains appropriate   ? ?Co-evaluation ? ? ?   ?  ?  ?  ?  ? ?  ?AM-PAC OT "6  Clicks" Daily Activity     ?Outcome Measure ? ? Help from another person eating meals?: A Little ?Help from another person taking care of personal grooming?: A Little ?Help from another person toileting, which includes using toliet, bedpan, or urinal?: A Little ?Help from another person bathing (including washing, rinsing, drying)?: A Lot ?Help from another person to put on and taking off regular upper body clothing?: A Lot ?Help from another person to put on and taking off regular lower body clothing?: A Lot ?6 Click Score: 15 ? ?  ?End of Session Equipment Utilized During Treatment: Gait belt ? ?OT Visit Diagnosis: Unsteadiness on feet (R26.81);Other abnormalities of gait and mobility (R26.89);Muscle weakness (generalized) (M62.81);Other symptoms and signs involving cognitive function;Dizziness and giddiness (R42);Adult, failure to thrive (R62.7);Pain ?  ?Activity Tolerance Patient limited by lethargy ?  ?Patient Left in bed;with call bell/phone within reach;with bed alarm set ?  ?Nurse Communication Mobility status ?  ? ?   ? ?Time: 2482-5003 ?OT Time Calculation (min): 17 min ? ?Charges: OT General Charges ?$OT Visit: 1 Visit ?OT Treatments ?$Self Care/Home Management : 8-22 mins ? ?Lodema Hong, OTA ?Acute Rehabilitation Services  ?Pager  [phone] ?Office [phone] ? ? ?[place] ?08/14/2021, 2:59 PM ?

## 2021-08-15 LAB — BASIC METABOLIC PANEL
Anion gap: 11 (ref 5–15)
BUN: 35 mg/dL — ABNORMAL HIGH (ref 8–23)
CO2: 25 mmol/L (ref 22–32)
Calcium: 9.2 mg/dL (ref 8.9–10.3)
Chloride: 102 mmol/L (ref 98–111)
Creatinine, Ser: 7.71 mg/dL — ABNORMAL HIGH (ref 0.44–1.00)
GFR, Estimated: 5 mL/min — ABNORMAL LOW (ref >60)
Glucose, Bld: 107 mg/dL — ABNORMAL HIGH (ref 70–99)
Potassium: 4.6 mmol/L (ref 3.5–5.1)
Sodium: 138 mmol/L (ref 135–145)

## 2021-08-15 LAB — URINE CULTURE

## 2021-08-15 MED ORDER — LIDOCAINE HCL (PF) 1 % IJ SOLN: 5.0000 mL | INTRAMUSCULAR | Status: DC | PRN

## 2021-08-15 MED ORDER — ISOSORBIDE MONONITRATE ER 30 MG PO TB24
30.0000 mg | ORAL_TABLET | Freq: Every day | ORAL | Status: DC
  Filled 2021-08-15: qty 1

## 2021-08-15 MED ORDER — SODIUM CHLORIDE 0.9 % IV SOLN: 100.0000 mL | INTRAVENOUS | Status: DC | PRN

## 2021-08-15 MED ORDER — SUCROFERRIC OXYHYDROXIDE 500 MG PO CHEW
1000.0000 mg | CHEWABLE_TABLET | Freq: Three times a day (TID) | ORAL | Status: DC
  Filled 2021-08-15 (×5): qty 2

## 2021-08-15 MED ORDER — ALTEPLASE 2 MG IJ SOLR
2.0000 mg | Freq: Once | INTRAMUSCULAR | Status: DC | PRN
  Filled 2021-08-15: qty 2

## 2021-08-15 MED ORDER — METOPROLOL TARTRATE 12.5 MG HALF TABLET
12.5000 mg | ORAL_TABLET | Freq: Once | ORAL | Status: AC
  Administered 2021-08-15: 12.5 mg via ORAL
  Filled 2021-08-15: qty 1

## 2021-08-15 MED ORDER — LIDOCAINE-PRILOCAINE 2.5-2.5 % EX CREA
1.0000 "application " | TOPICAL_CREAM | CUTANEOUS | Status: DC | PRN
  Filled 2021-08-15: qty 5

## 2021-08-15 MED ORDER — PENTAFLUOROPROP-TETRAFLUOROETH EX AERO: 1.0000 "application " | INHALATION_SPRAY | CUTANEOUS | Status: DC | PRN

## 2021-08-15 MED ORDER — HEPARIN SODIUM (PORCINE) 1000 UNIT/ML DIALYSIS
1000.0000 [IU] | INTRAMUSCULAR | Status: DC | PRN
  Filled 2021-08-15 (×2): qty 1

## 2021-08-15 NOTE — Plan of Care (Signed)

## 2021-08-15 NOTE — Progress Notes (Addendum)
Patient seen and again assessed at bedside.  Was easily arousable and reports that her headache is less now (8/10). Patient reports being able to rest more easily.  Patient was transitioned from Isordil to Imdur.  We will continue to monitor patient but plan for discharge tomorrow after HD. ? ?France Ravens, MD ?FPTS ?PGY1 Resident ?

## 2021-08-15 NOTE — Progress Notes (Signed)
Family Medicine Teaching Service ?Daily Progress Note ?Intern Pager: [phone] ? ?Patient name: Monique Henderson Medical record number: [id number] ?Date of birth: Dec 15, 1950 Age: 71 y.o. Gender: female ? ?Primary Care Provider: Myrtie Neither, PA-C ?Consultants: Nephrology ?Code Status: Full ? ?Pt Overview and Major Events to Date:  ?08/12/21 admitted ? ?Assessment and Plan: ?ZNIYAH MIDKIFF is a 71 y.o. female presenting with 1 week of dizziness and missing HD for 1 week. 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 ?  ?Uremic Encephalopathy ?ESRD on HD TTS ?Hyperkalemia, resolved ?K 4.4 > 4.6. BUN 58>35. On clonidine patch. Had HD yesterday.  ?-Nephro consulted, greatly appreciate assistance ?-Continue to monitor mental status ? ?Uncontrolled Hypertension secondary to Medication noncompliance ?Headache ?Overnight complains of 10/10 headache but was able to sleep. Has complained of intermittent frontal headache during hospitalization that was well-controlled with tylenol. Head CT no acute findings when first admitted. Concern that isordil tid may be causing headache. Other differential include hypertensive HA, substance withdrawal, caffeine withdrawal. If HA does not improve by afternoon/evening, will consider imaging. ?-Clonidine patch ?-Amlodipine 10 mg daily ?-Hydralazine 50 mg 3 times daily (consider increase if BP remains high) ?-Change isordil to Imdur 30 mg qd tomorrow ?-PT recommending SNF.  ?-Patient refusing SNF wants to go home. ?-High risk for readmission given medication noncompliance. Will attempt to get patient a pill box to make it easier for her to take medications. ? ?Chest pain, resolved ?Denies chest pain today.  ?-monitoring ?  ?Chronic Anemia ?Likely secondary to ESRD. Hgb: 11.1>10.2 ?-Defer ESA to Nephro ?  ?Depression, improving ?Appears  chronic. No acute concerns at this time. Multiple life stressors.  Denies SI/HI/AVH. ?-Will continue to monitor ?  ?COPD ?Home med: albuterol q6 hour PRN. On RA. ?-Albuterol available PRN ?  ?HLD ?Home med Crestor 10 mg ?-Resume home med ?  ?PAF with RVR ?Episode of SVT ?Overnight had 7 beats of SVT in the 160s, asymptomatic. ?Home med: Eliquis 2.5 mg bid/Warfarin (?). Denies itching today. ?-Continue Eliquis 2.5 mg bid.  ?  ? ?FEN/GI: Regular/Thin Liquid ?PPx: Eliquis ?Dispo: Home with home health PT/OT ? ?Subjective:  ?Patient seen at bedside. Arousable. States has intermittent "burning" frontal headache. Still wants to d/c home when medically cleared. ? ?Objective: ?Temp:  [97.5 ?F (36.4 ?C)-100.1 ?F (37.8 ?C)] 98.4 ?F (36.9 ?C) (03/17 0411) ?Pulse Rate:  [70-94] 72 (03/17 0552) ?Resp:  [14-22] 16 (03/17 0411) ?BP: (152-194)/(74-110) 154/84 (03/17 0552) ?SpO2:  [92 %-100 %] 96 % (03/17 0552) ?Weight:  [99 lb 3.3 oz (45 kg)-104 lb 15 oz (47.6 kg)] 104 lb 15 oz (47.6 kg) (03/17 0411) ?Physical Exam: ?General: Resting in bed. Somewhat uncomfortable. ?Cardiovascular:  ?Respiratory: RRR ?Abdomen: soft, nondistended ?Extremities: no swollen, able to move all extremities ? ?Laboratory: ?Recent Labs  ?Lab 08/12/21 ?1209 08/14/21 ?0420  ?WBC 4.9 5.2  ?HGB 11.1* 10.2*  ?HCT 34.4* 31.7*  ?PLT 240 181  ? ?Recent Labs  ?Lab 08/13/21 ?0019 08/13/21 ?0732 08/14/21 ?0420  ?NA 137 136 136  ?K 2.9* 3.9 4.4  ?CL 98 96* 98  ?CO2 '28 25 24  '$ ?BUN 39* 47* 58*  ?CREATININE 6.72* 8.79* 10.52*  ?CALCIUM 8.0* 8.2* 8.4*  ?PROT 6.5  --   --   ?BILITOT 0.4  --   --   ?ALKPHOS 117  --   --   ?ALT 24  --   --   ?  AST 18  --   --   ?GLUCOSE 99 109* 95  ? ? ? ? ?Imaging/Diagnostic Tests: ?No results found. ? ?France Ravens, MD ?08/15/2021, 7:18 AM ?PGY-1, [facility] ?[facility] Intern pager: [phone], text pages welcome ?

## 2021-08-15 NOTE — Progress Notes (Signed)
?   08/15/21 0054  ?Vitals  ?Temp 99.5 ?F (37.5 ?C)  ?Temp Source Oral  ?BP (!) 181/91  ?MAP (mmHg) 118  ?BP Location Left Arm  ?BP Method Automatic  ?Patient Position (if appropriate) Lying  ?Pulse Rate 94  ?Pulse Rate Source Monitor  ?ECG Heart Rate 96  ?Resp 16  ?Level of Consciousness  ?Level of Consciousness Alert  ?MEWS COLOR  ?MEWS Score Color Green  ?Oxygen Therapy  ?SpO2 98 %  ?O2 Device Room Air  ?PCA/Epidural/Spinal Assessment  ?Respiratory Pattern Regular;Unlabored  ?Glasgow Coma Scale  ?Eye Opening 4  ?Best Verbal Response (NON-intubated) 5  ?Best Motor Response 6  ?Glasgow Coma Scale Score 15  ?MEWS Score  ?MEWS Temp 0  ?MEWS Systolic 0  ?MEWS Pulse 0  ?MEWS RR 0  ?MEWS LOC 0  ?MEWS Score 0  ? ?VS checked due to short episode of SVT with HR in the 160s.  Upon arrival patient appeared to be asleep and HR back in the 90s.  BP elevated.  Patient states her head pain is still present with 10/10.  MD notified.    ?

## 2021-08-15 NOTE — Progress Notes (Signed)
FPTS Interim Progress Note ? ?S:Went to see pt d/t persist 10/10 headache, BP elevated to 181/91 and HR in 160's for 8 seconds. Pt states headache improved slightly after Aspirin 325 mg but returned to 10/10. She denies and CP, SOB. ? ?O: ?BP (!) 181/91 (BP Location: Left Arm)   Pulse 94   Temp 99.5 ?F (37.5 ?C) (Oral)   Resp 16   Ht '5\' 3"'$  (1.6 m)   Wt 45 kg   SpO2 98%   BMI 17.57 kg/m?   ?General: Pt lying in bed, mild distress ?Cardio: HR in high 90's ?Resp: normal WOB ?Neuro: Cranial nerves II through XII intact, sensation intact ? ?A/P: ?Spoke with pharmacy regarding medications to lower blood pressure as elevated blood pressure may be causing/contributing to headache.  Pharmacy agreed that it would be safe to give a one-time dose of metoprolol tartrate 12.5 mg regardless of history of cocaine use. ?-Metoprolol tartrate 12.5 mg once ? ?Precious Gilding, DO ?08/15/2021, 2:05 AM ?PGY-1, [facility] ?Service pager [phone]  ?

## 2021-08-15 NOTE — Progress Notes (Signed)
Mobility Specialist Progress Note: ? ? 08/15/21 1300  ?Mobility  ?Activity Ambulated with assistance in room  ?Level of Assistance Standby assist, set-up cues, supervision of patient - no hands on  ?Assistive Device None  ?Distance Ambulated (ft) 15 ft  ?Activity Response Tolerated well  ?$Mobility charge 1 Mobility  ? ?Responded to bed alarm. Found pt ambulating in room to sit in front of sink, waiting for NT for assistance. No AD used, no physical assist required. Pt left sitting in chair with all needs met.  ? ?Nelta Numbers ?Acute Rehab ?Phone: [phone] ?Office Phone: [phone] ? ?

## 2021-08-15 NOTE — Progress Notes (Signed)
Case discussed with family medicine staff. Pt is not stable for d/c today. Will alert out-pt HD clinic of pt's return to clinic once d/c date is known.  ? ?Melven Sartorius ?Renal Navigator ?[phone] ?

## 2021-08-15 NOTE — Care Management Important Message (Signed)
Important Message ? ?Patient Details  ?Name: Monique Henderson ?MRN: [id number] ?Date of Birth: March 17, 1951 ? ? ?Medicare Important Message Given:  Yes ? ? ? ? ?[name] ?08/15/2021, 7:47 AM ?

## 2021-08-15 NOTE — Plan of Care (Signed)
  Problem: Clinical Measurements: Goal: Respiratory complications will improve Outcome: Progressing   Problem: Activity: Goal: Risk for activity intolerance will decrease Outcome: Progressing   Problem: Coping: Goal: Level of anxiety will decrease Outcome: Progressing   Problem: Safety: Goal: Ability to remain free from injury will improve Outcome: Progressing   

## 2021-08-15 NOTE — Progress Notes (Signed)
PT Cancellation Note ? ?Patient Details ?Name: CEDRIC DENISON ?MRN: [id number] ?DOB: [birth year] ? ? ?Cancelled Treatment:    Reason Eval/Treat Not Completed: Fatigue/lethargy limiting ability to participate again this afternoon, pt stating "I just don't want to get up" when asked reason for wanting to remain in bed. The pt states she has ambulated in the room earlier today and therefore would like to rest at this time. Attempted to educate the pt on importance of mobility to maintain strength and reduce risk of other medical complications. Pt declining all offers of mobility, bed-level exercises, and self care. Will continue to follow and attempt to progress as pt allows.  ? ?West Carbo, PT, DPT  ? ?Acute Rehabilitation Department ?Pager #: [phone] - [phone] ? ? ?Sandra Cockayne ?08/15/2021, 4:16 PM ?

## 2021-08-15 NOTE — Progress Notes (Signed)
PT Cancellation Note ? ?Patient Details ?Name: Monique Henderson ?MRN: [id number] ?DOB: 13-Jan-1951 ? ? ?Cancelled Treatment:    Reason Eval/Treat Not Completed: Fatigue/lethargy limiting ability to participate at this time. Pt would not open eyes during conversation with therapist and reports she is too tired since she worked with PT yesterday. Will continue to circle back and attempt to continue progressing pt mobility.  ? ?West Carbo, PT, DPT  ? ?Acute Rehabilitation Department ?Pager #: [phone] - [phone] ? ? ?Sandra Cockayne ?08/15/2021, 12:50 PM ?

## 2021-08-15 NOTE — Progress Notes (Signed)
[facility] ?Progress Note  ? ?Subjective:    ?Seen and examined patient at bedside. No complaints. Denies SOB and CP. Plan for HD 3/18 if patient is still here. ? ?Objective ?Vitals:  ? 08/15/21 0330 08/15/21 0411 08/15/21 7681 08/15/21 1572  ?BP: (!) 194/90 (!) 182/87 (!) 154/84 (!) 183/100  ?Pulse:   72 76  ?Resp:  16  16  ?Temp:  98.4 ?F (36.9 ?C)  98.5 ?F (36.9 ?C)  ?TempSrc:  Oral  Axillary  ?SpO2:  98% 96%   ?Weight:  47.6 kg    ?Height:      ? ?Physical Exam ?General: Ill-appearing; NAD ?Heart: S1 and S2; No murmurs, gallops, or rubs ?Lungs: Clear throughout ?Abdomen: Soft and non-tender ?Extremities: No edema ?Dialysis Access: R TDC  ? ?Filed Weights  ? 08/14/21 0916 08/14/21 1218 08/15/21 0411  ?Weight: 47 kg 45 kg 47.6 kg  ? ? ?Intake/Output Summary (Last 24 hours) at 08/15/2021 1043 ?Last data filed at 08/14/2021 2325 ?Gross per 24 hour  ?Intake 240 ml  ?Output 2000 ml  ?Net -1760 ml  ? ? ?Additional Objective ?Labs: ?Basic Metabolic Panel: ?Recent Labs  ?Lab 08/13/21 ?0732 08/14/21 ?0420 08/15/21 ?6203  ?NA 136 136 138  ?K 3.9 4.4 4.6  ?CL 96* 98 102  ?CO2 '25 24 25  '$ ?GLUCOSE 109* 95 107*  ?BUN 47* 58* 35*  ?CREATININE 8.79* 10.52* 7.71*  ?CALCIUM 8.2* 8.4* 9.2  ?PHOS  --  7.4*  --   ? ?Liver Function Tests: ?Recent Labs  ?Lab 08/13/21 ?5597 08/14/21 ?0420  ?AST 18  --   ?ALT 24  --   ?ALKPHOS 117  --   ?BILITOT 0.4  --   ?PROT 6.5  --   ?ALBUMIN 3.3* 2.8*  ? ?No results for input(s): LIPASE, AMYLASE in the last 168 hours. ?CBC: ?Recent Labs  ?Lab 08/12/21 ?1209 08/14/21 ?0420  ?WBC 4.9 5.2  ?HGB 11.1* 10.2*  ?HCT 34.4* 31.7*  ?MCV 94.5 92.7  ?PLT 240 181  ? ?Blood Culture ?   ?Component Value Date/Time  ? SDES URINE, CLEAN CATCH 08/12/2021 1250  ? SPECREQUEST  08/12/2021 1250  ?  NONE ?Performed at [facility], [place] [address], [place], [place] [address] ?  ? CULT MULTIPLE SPECIES PRESENT, SUGGEST RECOLLECTION (A) 08/12/2021 1250  ? REPTSTATUS 08/15/2021 FINAL 08/12/2021 1250   ? ? ?Cardiac Enzymes: ?No results for input(s): CKTOTAL, CKMB, CKMBINDEX, TROPONINI in the last 168 hours. ?CBG: ?Recent Labs  ?Lab 08/12/21 ?1441  ?GLUCAP 115*  ? ?Iron Studies: No results for input(s): IRON, TIBC, TRANSFERRIN, FERRITIN in the last 72 hours. ?Lab Results  ?Component Value Date  ? INR 1.7 (H) 08/13/2021  ? INR 1.5 (H) 06/09/2021  ? INR 1.5 (H) 06/08/2021  ? ?Studies/Results: ?No results found. ? ?Medications: ? ? amLODipine  10 mg Oral Daily  ? apixaban  2.5 mg Oral BID  ? Chlorhexidine Gluconate Cloth  6 each Topical Daily  ? doxercalciferol  4 mcg Intravenous Q T,Th,Sa-HD  ? hydrALAZINE  50 mg Oral Q8H  ? isosorbide dinitrate  40 mg Oral TID  ? losartan  100 mg Oral Daily  ? mirtazapine  7.5 mg Oral QHS  ? rosuvastatin  10 mg Oral Daily  ? ? ?Dialysis Orders: ?[facility] EDW 43.53 hours 45 minutes ?2K 2 CA bath ?No heparin ?Hectorol 4 mics ?Mircera last given 07/24/21 100 mcg  ?Right IJ PermCath,(has refused other access alternatives) ?  ?Assessment/Plan: ?1. Hyperkalemia-resolved with  HD. K+ now 4.6 ?2. 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 Plan for HD 3/18 if patient is still here ?3. Anemia of CKD- Hgb 10.2. Plan to continue ESA needs in outpatient ?4. Secondary hyperparathyroidism - Continue VDRA and binders ?5. HTN/volume - Euvolemic on exam. Noted high Bps. Metoprolol X 1 given earlier today. Continue Norvasc and Hydralazine.  ?6. Nutrition - Renal diet with fluid restriction. ?7. Dispo-okay for discharge from renal standpoint. Can resume HD tomorrow in outpatient. ? ?Tobie Poet, NP ?[facility] ?08/15/2021,10:43 AM ? LOS: 3 days  ?  ?

## 2021-08-15 NOTE — Progress Notes (Signed)
FPTS Interim Progress Note ? ?S: Went to see patient d/t headache she rated as 10/10.  Patient states it is the same headache she has had for the past couple days but is worse tonight.  Describes it as burning and frontal in location.  He is asking for pain medication.  Patient states she typically drinks a cup and a half of coffee every morning but has not been drinking coffee during this admission.  She received Tylenol 1000 mg about 3 hours ago but states it has not helped. ? ?O: ?BP (!) 168/74   Pulse 81   Temp 98.4 ?F (36.9 ?C) (Oral)   Resp 14   Ht '5\' 3"'$  (1.6 m)   Wt 45 kg   SpO2 100%   BMI 17.57 kg/m?   ?General: Patient lying in bed, mild distress ?Cardio: Well-perfused ?Respiratory: Breathing comfortably on room air ?Neuro: Cranial nerves II through XII intact, sensation intact ? ?A/P: ?Headache ?Patient had head CT on 08/12/2021 which showed no acute intracranial process.  As this headache has been persistent over the past couple days and neuro exam is reassuring, will not repeat head imaging at this time.  Currently has Tylenol 1000 mg every 6 hours ordered.  I spoke with pharmacy who stated a one-time dose of aspirin would be safe as patient receives HD.  Also told patient if she felt like it, she could sip on a cup of coffee, some caffeine may help her headache. ?-Aspirin 325 mg once ? ?Precious Gilding, DO ?08/15/2021, 12:42 AM ?PGY-1, [facility] ?Service pager [phone]  ?

## 2021-08-16 LAB — BASIC METABOLIC PANEL
Anion gap: 14 (ref 5–15)
BUN: 54 mg/dL — ABNORMAL HIGH (ref 8–23)
CO2: 23 mmol/L (ref 22–32)
Calcium: 9.2 mg/dL (ref 8.9–10.3)
Chloride: 100 mmol/L (ref 98–111)
Creatinine, Ser: 9.49 mg/dL — ABNORMAL HIGH (ref 0.44–1.00)
GFR, Estimated: 4 mL/min — ABNORMAL LOW (ref >60)
Glucose, Bld: 90 mg/dL (ref 70–99)
Potassium: 4.9 mmol/L (ref 3.5–5.1)
Sodium: 137 mmol/L (ref 135–145)

## 2021-08-16 LAB — MAGNESIUM: Magnesium: 2.3 mg/dL (ref 1.7–2.4)

## 2021-08-16 MED ORDER — ISOSORBIDE MONONITRATE ER 30 MG PO TB24: 30.0000 mg | ORAL_TABLET | Freq: Every day | ORAL | 0 refills | Status: DC

## 2021-08-16 NOTE — Progress Notes (Addendum)
Was secure chatted by RN that patient was hypertensive to 193/108, was having a headache, heart rate to the 150s with an episode of V. tach.  She had just returned from dialysis, and had not yet received her blood pressure medications.  Went to assess patient, and she was sitting up in bed asymptomatic, denying having any chest pain, or headache.  She is asking to go home.  She was just given her blood pressure medication, will get repeat blood pressure and if appropriate can discharge and we will work to get her transportation. ? ?Blood pressure even after medication still elevated at 180/90. Discussed with patient staying another night to continue to monitor and lower BP but she is wanting to go home. Discussed importance of taking her medication to prevent stroke and other complications. Expressed understanding and will follow up with PCP in the next week.  ?

## 2021-08-16 NOTE — Discharge Summary (Addendum)
Family Medicine Teaching Service ?Hospital Discharge Summary ? ?Patient name: Monique Henderson Medical record number: [id number] ?Date of birth: April 19, 1951 Age: 71 y.o. Gender: female ?Date of Admission: 08/12/2021  Date of Discharge: 08/16/21 ?Admitting Physician: France Ravens, MD ? ?Primary Care Provider: Myrtie Neither, PA-C ?Consultants: Nephrology  ? ?Indication for Hospitalization: Uremic encephalopathy  ? ?Discharge Diagnoses/Problem List:  ? ?Uremic encephalopathy ?ESRD on HD ?Ventricular tachycardia ?PAF with RVR ?Hypertension ?Anemia ?COPD ?Hyperlipidemia ? ?Disposition: Home  ? ?Discharge Condition: Stable  ? ?Discharge Exam:  ? ?Temp:  [98.5 ?F (36.9 ?C)-99.6 ?F (37.6 ?C)] 98.6 ?F (37 ?C) (03/18 0604) ?Pulse Rate:  [50-27] 74 (03/17 1924) ?Resp:  [16-18] 16 (03/18 0604) ?BP: (140-189)/(77-100) 189/96 (03/18 0604) ?SpO2:  [97 %-98 %] 98 % (03/18 0604) ?Weight:  [46.5 kg] 46.5 kg (03/18 0617) ?Physical Exam: ?General: alert, sitting up in bed, NAD  ?Cardiovascular: RRR no murmurs  ?Respiratory: CTAB normal WOB ?Abdomen: soft, non distended  ?Extremities: warm, dry. No LE edema  ? ?Brief Hospital Course:  ? ?Monique Henderson is a 71 y.o. female presenting with 1 week of dizziness and missing HD for 1 week. 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 ?Uremic Encephalopathy  ESRD on HD TTS ?Pt presented after missing one week of dialysis d/t having "chest pain". Pt was very lethargic and a poor historian. Head CT showed no acute intracranial process. In ED pt had BUN 155, Cr 19.69, K 6.7. Pt received Lokelma 10 g, calcium gluconate 1 g, insulin aspart 5 U with 50% dextrose, sodium bicarb 50 mEq in ED for hyperkalemia. Pt was hypertensive to 204/128 in Ed. Nephrology was consulted and pt received dialysis on night of admission.  Patient resumed regularly scheduled  HD while in hospital.  Patient was discharged after she received HD on Saturday, 3/18. ? ?Chest Pain ?Pt complained of right sided CP that moves to center of chest for past week. CXR showed cardia enlargement with pulmonary vascular congestion. EKG showed no changes from prior on 07/15/21 and no evidence of MI.  Trops trended down.  Denied any chest pain after first day of hospitalization. ? ?HTN ?Pt was hypertensive to 204/128 in ED. Patient was resumed on home medications as prescribed and her last hospitalization in January 2023.  BP continued to fluctuate and was in part related to her headaches and needing HD. Was recommended to follow up on outpatient.  ? ?Frontal Headache ?Patient complained about headaches during hospitalization which appeared to have improved after patient was switched after Isordil was held. At discharge she was switched to Imdur. ? ?PAF with RVR ?Patient has had a history of PAF with RVR.  Patient was placed on cardiac monitoring and did have an brief episode of 7 beats of SVT in the 160s. Discussed with cardiology who recommended checking potassium and magnesium which were both normal. No further work up was recommended. Patient's home medication of warfarin was held because of patient non compliance.  It was deemed that patient would be safer on Eliquis which is what she was discharged on.  ? ?Other chronic conditions were monitored and home medications were continued including: Chronic anemia, COPD, hyperlipidemia ? ?Issues for Follow Up:  ?Monitor blood pressure- may need medication adjustment for elevated BP  ?UDS positive for cocaine- discuss cessation  ?Patient had episodes of brief intermittent V tach. Cardiology was called who  did not recommend additional work up in the hospital. Would recommend outpatient cardiology follow up. ? ?Significant Procedures: None  ? ?Significant Labs and Imaging:  ?Recent Labs  ?Lab 08/12/21 ?1209 08/14/21 ?0420  ?WBC 4.9 5.2  ?HGB 11.1* 10.2*  ?HCT  34.4* 31.7*  ?PLT 240 181  ? ?Recent Labs  ?Lab 08/13/21 ?4128 08/13/21 ?0732 08/14/21 ?7867 08/15/21 ?6720 08/16/21 ?9470  ?NA 137 136 136 138 137  ?K 2.9* 3.9 4.4 4.6 4.9  ?CL 98 96* 98 102 100  ?CO2 '28 25 24 25 23  '$ ?GLUCOSE 99 109* 95 107* 90  ?BUN 39* 47* 58* 35* 54*  ?CREATININE 6.72* 8.79* 10.52* 7.71* 9.49*  ?CALCIUM 8.0* 8.2* 8.4* 9.2 9.2  ?MG  --   --   --   --  2.3  ?PHOS  --   --  7.4*  --   --   ?ALKPHOS 117  --   --   --   --   ?AST 18  --   --   --   --   ?ALT 24  --   --   --   --   ?ALBUMIN 3.3*  --  2.8*  --   --   ? ?CT HEAD WO CONTRAST (5MM) ? ?Result Date: 08/12/2021 ?CLINICAL DATA:  Mental status change, unknown cause.  Dizziness. EXAM: CT HEAD WITHOUT CONTRAST TECHNIQUE: Contiguous axial images were obtained from the base of the skull through the vertex without intravenous contrast. RADIATION DOSE REDUCTION: This exam was performed according to the departmental dose-optimization program which includes automated exposure control, adjustment of the mA and/or kV according to patient size and/or use of iterative reconstruction technique. COMPARISON:  06/07/2021. FINDINGS: Brain: No acute intracranial hemorrhage, midline shift or mass effect. No extra-axial fluid collection. Mild atrophy is noted. Subcortical and periventricular white matter hypodensities are seen bilaterally. No hydrocephalus. Vascular: Atherosclerotic calcification of the carotid siphons. No hyperdense vessel. Skull: Normal. Negative for fracture or focal lesion. Sinuses/Orbits: No acute finding. Other: None. IMPRESSION: 1. No acute intracranial process. 2. Atrophy with extensive chronic microvascular ischemic changes. Electronically Signed   By: Brett Fairy M.D.   On: 08/12/2021 20:03  ? ?DG Chest Port 1 View ? ?Result Date: 08/12/2021 ?CLINICAL DATA:  Shortness of breath for 1 week, missed hemodialysis, former smoker EXAM: PORTABLE CHEST 1 VIEW COMPARISON:  Portable exam 1512 hours compared to 07/15/2021 FINDINGS: LEFT jugular  dual-lumen central venous catheter with tip in RIGHT atrium. Enlargement of cardiac silhouette post median sternotomy with pulmonary vascular congestion. Atherosclerotic calcification aorta. RIGHT basilar opacity question atelectasis versus infiltrate. Mild subsegmental atelectasis LEFT mid lung. No pleural effusion or pneumothorax. IMPRESSION: Enlargement of cardiac silhouette with pulmonary vascular congestion. Atelectasis versus infiltrate at RIGHT base with mild subsegmental atelectasis at lingula. Electronically Signed   By: Lavonia Dana M.D.   On: 08/12/2021 15:25    ? ?Results/Tests Pending at Time of Discharge: None ? ?Discharge Medications:  ?Allergies as of 08/16/2021   ? ?   Reactions  ? Shrimp [shellfish Allergy] Shortness Of Breath  ? Bactroban [mupirocin] Other (See Comments)  ? "Sores in nose"  ? Vicodin [hydrocodone-acetaminophen] Itching, Nausea And Vomiting  ? This is patient's home medication  ? Eliquis [apixaban] Itching  ? Spoke with patient, no rash.  Has tolerated on re-challenge.  ? Lisinopril Cough  ? Tylenol [acetaminophen] Itching  ? ?  ? ?  ?Medication List  ?  ? ?STOP taking these medications   ? ?cyclobenzaprine  5 MG tablet ?Commonly known as: FLEXERIL ?  ?isosorbide dinitrate 40 MG tablet ?Commonly known as: ISORDIL ?  ?torsemide 20 MG tablet ?Commonly known as: DEMADEX ?  ?warfarin 6 MG tablet ?Commonly known as: COUMADIN ?  ? ?  ? ?TAKE these medications   ? ?albuterol 108 (90 Base) MCG/ACT inhaler ?Commonly known as: VENTOLIN HFA ?Inhale 2 puffs into the lungs every 6 (six) hours as needed for wheezing or shortness of breath. ?  ?amLODipine 10 MG tablet ?Commonly known as: NORVASC ?Take 1 tablet (10 mg total) by mouth daily. ?  ?cloNIDine 0.1 mg/24hr patch ?Commonly known as: CATAPRES - Dosed in mg/24 hr ?Place 1 patch (0.1 mg total) onto the skin every Saturday. ?  ?Eliquis 2.5 MG Tabs tablet ?Generic drug: apixaban ?Take 1 tablet (2.5 mg total) by mouth 2 (two) times daily. ?   ?hydrALAZINE 50 MG tablet ?Commonly known as: APRESOLINE ?Take 1 tablet (50 mg total) by mouth 3 (three) times daily. ?  ?isosorbide mononitrate 30 MG 24 hr tablet ?Commonly known as: IMDUR ?Take 1 tablet (30

## 2021-08-16 NOTE — Progress Notes (Signed)
FPTS Brief Progress Note ? ?S: Pt sleeping soundly. Per RN pt had 2 runs of asymptomatic Vtach, one captured and 17 beats long. Now with NSR. ? ? ?O: ?BP (!) 164/92 (BP Location: Left Arm)   Pulse 74   Temp 98.6 ?F (37 ?C) (Oral)   Resp 16   Ht '5\' 3"'$  (1.6 m)   Wt 47.6 kg   SpO2 98%   BMI 18.59 kg/m?   ?General: lying in bed, NAD ?Respiratory: breathing comfortably on RA ? ?A/P: ?Continue to follow plan outlined in day teams progress note. Added BMP and magnesium for morning labs d/t Vtach. Pt can likely f/u for Vtach out pt as she is asymptomatic, will discuss with day team. ?- Orders reviewed. Labs for AM ordered, which was adjusted as needed.  ? ? ?Precious Gilding, DO ?08/16/2021, 1:41 AM ?PGY-1, [facility] Night Resident  ?Please page [phone] with questions.  ?  ?

## 2021-08-16 NOTE — Progress Notes (Signed)
Family Medicine Teaching Service ?Daily Progress Note ?Intern Pager: [phone] ? ?Patient name: Monique Henderson Medical record number: [id number] ?Date of birth: 10/09/1950 Age: 71 y.o. Gender: female ? ?Primary Care Provider: Myrtie Neither, PA-C ?Consultants: Nephrology  ?Code Status: Full  ? ?Pt Overview and Major Events to Date:  ?08/12/21 admitted ? ?Assessment and Plan: ?Monique Henderson is a 71 y.o. female presenting with 1 week of dizziness and missing HD for 1 week. 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 ? ?Uremic Encephalopathy, resolved  ?ESRD on HD TTS ?Patient doing well, alert and oriented. Will have HD and then discharge.  ? ?Ventricular Tachycardia  ?PAF with RVR ?Patient had 2 runs of V. tach, one captured 17 beats.  Given cardiac history, consulted cards who said they wouldn't do anything for it but check magnesium and K+ which was 2.3 and 4.9 respectively.  ?- continue home med Eliquis 2.'5mg'$  BID  ? ?Uncontrolled Hypertension secondary to Medication noncompliance ?Headache ?Today patient denies headache. Potentially due to Isordil which was held and on discharge will be switched to Imdur.  ?-Amlodipine 10 mg daily ?-Hydralazine 50 mg 3 times daily ? ?Anemia, chronic ?Likely anemia of chronic disease given ESRD. Hgb yesterday stable at 10.2 ? ?COPD ?Home med: albuterol q6 hour PRN. On RA. ?-Albuterol available PRN ?  ?HLD ?Home med Crestor 10 mg ?-Resume home med ? ?FEN/GI: Regular/Thin Liquid ?PPx: Eliquis  ? ?Disposition: Today after HD.  ? ?Subjective:  ?No acute events overnight. No headaches  ? ?Objective: ?Temp:  [98.5 ?F (36.9 ?C)-99.6 ?F (37.6 ?C)] 98.6 ?F (37 ?C) (03/18 0604) ?Pulse Rate:  [43-15] 40 (03/17 1924) ?Resp:  [16-18] 16 (03/18 0604) ?BP: (140-189)/(77-100) 189/96 (03/18 0604) ?SpO2:  [97 %-98 %] 98 % (03/18 0604) ?Weight:   [46.5 kg] 46.5 kg (03/18 0617) ?Physical Exam: ?General: alert, sitting up in bed, NAD  ?Cardiovascular: RRR no murmurs  ?Respiratory: CTAB normal WOB ?Abdomen: soft, non distended  ?Extremities: warm, dry. No LE edema  ? ?Laboratory: ?Recent Labs  ?Lab 08/12/21 ?1209 08/14/21 ?0420  ?WBC 4.9 5.2  ?HGB 11.1* 10.2*  ?HCT 34.4* 31.7*  ?PLT 240 181  ? ?Recent Labs  ?Lab 08/13/21 ?0867 08/13/21 ?0732 08/14/21 ?6195 08/15/21 ?0932 08/16/21 ?6712  ?NA 137   < > 136 138 137  ?K 2.9*   < > 4.4 4.6 4.9  ?CL 98   < > 98 102 100  ?CO2 28   < > '24 25 23  '$ ?BUN 39*   < > 58* 35* 54*  ?CREATININE 6.72*   < > 10.52* 7.71* 9.49*  ?CALCIUM 8.0*   < > 8.4* 9.2 9.2  ?PROT 6.5  --   --   --   --   ?BILITOT 0.4  --   --   --   --   ?ALKPHOS 117  --   --   --   --   ?ALT 24  --   --   --   --   ?AST 18  --   --   --   --   ?GLUCOSE 99   < > 95 107* 90  ? < > = values in this interval not displayed.  ? ? ? ?Imaging/Diagnostic Tests: ? ?None new  ? ?Shary Key, DO ?08/16/2021, 7:49 AM ?PGY-2, [place] ?[place] Intern pager: [phone], text  pages welcome  ?

## 2021-08-16 NOTE — Progress Notes (Signed)
?[facility] ?Progress Note  ? ?Subjective:    ?Seen and examined patient at bedside. No complaints. Denies SOB and CP. Plan for HD today. ? ?Objective ?Vitals:  ? 08/16/21 0012 08/16/21 0604 08/16/21 0617 08/16/21 6606  ?BP: (!) 164/92 (!) 189/96  (!) 161/86  ?Pulse:      ?Resp: '16 16  16  '$ ?Temp: 98.6 ?F (37 ?C) 98.6 ?F (37 ?C)  99.1 ?F (37.3 ?C)  ?TempSrc: Oral Oral  Oral  ?SpO2: 98% 98%    ?Weight:   46.5 kg   ?Height:      ? ?Physical Exam ?General: Ill-appearing; NAD ?Heart: S1 and S2; No murmurs, gallops, or rubs ?Lungs: Clear throughout ?Abdomen: Soft and non-tender ?Extremities: No edema ?Dialysis Access: R TDC  ? ?Filed Weights  ? 08/14/21 1218 08/15/21 0411 08/16/21 0617  ?Weight: 45 kg 47.6 kg 46.5 kg  ? ? ?Intake/Output Summary (Last 24 hours) at 08/16/2021 0951 ?Last data filed at 08/16/2021 0600 ?Gross per 24 hour  ?Intake 938 ml  ?Output 0 ml  ?Net 938 ml  ? ? ?Additional Objective ?Labs: ?Basic Metabolic Panel: ?Recent Labs  ?Lab 08/14/21 ?0420 08/15/21 ?0649 08/16/21 ?3016  ?NA 136 138 137  ?K 4.4 4.6 4.9  ?CL 98 102 100  ?CO2 '24 25 23  '$ ?GLUCOSE 95 107* 90  ?BUN 58* 35* 54*  ?CREATININE 10.52* 7.71* 9.49*  ?CALCIUM 8.4* 9.2 9.2  ?PHOS 7.4*  --   --   ? ?Liver Function Tests: ?Recent Labs  ?Lab 08/13/21 ?0109 08/14/21 ?0420  ?AST 18  --   ?ALT 24  --   ?ALKPHOS 117  --   ?BILITOT 0.4  --   ?PROT 6.5  --   ?ALBUMIN 3.3* 2.8*  ? ?No results for input(s): LIPASE, AMYLASE in the last 168 hours. ?CBC: ?Recent Labs  ?Lab 08/12/21 ?1209 08/14/21 ?0420  ?WBC 4.9 5.2  ?HGB 11.1* 10.2*  ?HCT 34.4* 31.7*  ?MCV 94.5 92.7  ?PLT 240 181  ? ?Blood Culture ?   ?Component Value Date/Time  ? SDES URINE, CLEAN CATCH 08/12/2021 1250  ? SPECREQUEST  08/12/2021 1250  ?  NONE ?Performed at [facility], [place] [address], [place], [place] [address] ?  ? CULT MULTIPLE SPECIES PRESENT, SUGGEST RECOLLECTION (A) 08/12/2021 1250  ? REPTSTATUS 08/15/2021 FINAL 08/12/2021 1250  ? ? ?Cardiac Enzymes: ?No results  for input(s): CKTOTAL, CKMB, CKMBINDEX, TROPONINI in the last 168 hours. ?CBG: ?Recent Labs  ?Lab 08/12/21 ?1441  ?GLUCAP 115*  ? ?Iron Studies: No results for input(s): IRON, TIBC, TRANSFERRIN, FERRITIN in the last 72 hours. ?Lab Results  ?Component Value Date  ? INR 1.7 (H) 08/13/2021  ? INR 1.5 (H) 06/09/2021  ? INR 1.5 (H) 06/08/2021  ? ?Studies/Results: ?No results found. ? ?Medications: ? sodium chloride    ? sodium chloride    ? ? amLODipine  10 mg Oral Daily  ? apixaban  2.5 mg Oral BID  ? Chlorhexidine Gluconate Cloth  6 each Topical Daily  ? doxercalciferol  4 mcg Intravenous Q T,Th,Sa-HD  ? hydrALAZINE  50 mg Oral Q8H  ? isosorbide mononitrate  30 mg Oral Daily  ? losartan  100 mg Oral Daily  ? mirtazapine  7.5 mg Oral QHS  ? rosuvastatin  10 mg Oral Daily  ? sucroferric oxyhydroxide  1,000 mg Oral TID WC  ? ? ?Dialysis Orders: ?[facility] T[facility] EDW 43.53 hours 45 minutes ?2K 2 CA bath ?No heparin ?Hectorol 4 mics ?Mircera  last given 07/24/21 100 mcg  ?Right IJ PermCath,(has refused other access alternatives) ? ?Assessment/Plan: ?1. Hyperkalemia-resolved with HD. K+ now 4.9 ?2. 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 Plan for HD today ?3. Anemia of CKD- Hgb 10.2. Plan to continue ESA needs in outpatient ?4. Secondary hyperparathyroidism - Continue VDRA and binders ?5. HTN/volume - Euvolemic on exam. Noted high Bps. Metoprolol X 1 given earlier today. Continue Norvasc, Hydralazine, Losartan, Imdur.  ?6. Nutrition - Renal diet with fluid restriction. ?7. Dispo-okay for discharge after HD from renal standpoint. Patient can resume HD in outpatient on Tues 3/21. ? ?Tobie Poet, NP ?[facility] ?08/16/2021,9:51 AM ? LOS: 4 days  ?  ?

## 2021-08-16 NOTE — Discharge Instructions (Signed)
Dear Monique Henderson,  ? ?Thank you so much for allowing [place] to be part of your care!  You were admitted to [facility] for dizziness likely due to missed dialysis sessions. We are glad you are feeling better!  ? ? ?POST-HOSPITAL & CARE INSTRUCTIONS ?Ensure you are attending your scheduled dialysis  ?Please let PCP/Specialists know of any changes that were made.  ?Please see medications section of this packet for any medication changes.  ? ?DOCTOR'S APPOINTMENT & FOLLOW UP CARE INSTRUCTIONS  ?No future appointments. ? ?RETURN PRECAUTIONS: ?Return for difficulty breathing, new weakness, worse headache or new and concerning symptoms  ? ?Take care and be well! ? ?Family Medicine Teaching Service Inpatient Team ?[place]  ?[facility]  ?[address] [place], [place] [address] ?[phone]  ?

## 2021-08-17 NOTE — TOC Transition Note (Signed)
Transition of care contact from inpatient facility ? ?Date of discharge: 08/16/21 ?Date of contact: 08/17/21 ?Method: Phone ?Spoke to: Patient ? ?Patient contacted to discuss transition of care from recent inpatient hospitalization. Patient was admitted to [facility] from 08/12/21-08/16/21 with discharge diagnosis of uremic encephalopathy and HTN urgency. ? ?Medication changes were reviewed. ? ?Patient will follow up with her outpatient HD unit on: Tuesday 08/19/21 at [facility]. ? ?Tobie Poet, NP ?  ?

## 2021-08-21 NOTE — ED Provider Notes (Signed)
?[facility] ?Provider Note ?Late note reentry, computer error ? ?CSN: [id number] ?Arrival date & time: 08/12/21  1153 ? ?  ? ?History ? ?Chief Complaint  ?Patient presents with  ? Dizziness  ? ? ?Monique Henderson is a 71 y.o. female. ? ?HPI ?Adult female presents with concern of dizziness, unsteadiness, no focal pain.  She acknowledges not going to dialysis for about 1 week, but cannot specify why she did not do so.  Patient provides no details, but is somewhat listless, with inconsistent full depth of history obtained, level 5 caveat secondary to acuity of condition, mental status change ?  ? ?Home Medications ?Prior to Admission medications   ?Medication Sig Start Date End Date Taking? Authorizing Provider  ?albuterol (VENTOLIN HFA) 108 (90 Base) MCG/ACT inhaler Inhale 2 puffs into the lungs every 6 (six) hours as needed for wheezing or shortness of breath. 07/30/20  Yes Maximiano Coss, NP  ?amLODipine (NORVASC) 10 MG tablet Take 1 tablet (10 mg total) by mouth daily. 02/14/21  Yes Maximiano Coss, NP  ?apixaban (ELIQUIS) 2.5 MG TABS tablet Take 1 tablet (2.5 mg total) by mouth 2 (two) times daily. 06/09/21  Yes Caren Griffins, MD  ?cloNIDine (CATAPRES - DOSED IN MG/24 HR) 0.1 mg/24hr patch Place 1 patch (0.1 mg total) onto the skin every Saturday. 06/14/21  Yes Caren Griffins, MD  ?hydrALAZINE (APRESOLINE) 50 MG tablet Take 1 tablet (50 mg total) by mouth 3 (three) times daily. 06/21/21 07/21/21  Nolberto Hanlon, MD  ?isosorbide mononitrate (IMDUR) 30 MG 24 hr tablet Take 1 tablet (30 mg total) by mouth daily. 08/17/21   Shary Key, DO  ?loperamide (IMODIUM) 2 MG capsule Take 2 mg by mouth as needed for diarrhea or loose stools. 07/15/20   [provider]  ?losartan (COZAAR) 100 MG tablet Take 1 tablet (100 mg total) by mouth daily. 02/14/21   Maximiano Coss, NP  ?mirtazapine (REMERON) 7.5 MG tablet Take 1 tablet (7.5 mg total) by mouth at bedtime. ?Patient taking differently: Take  7.5 mg by mouth at bedtime as needed (sleep). 07/15/20   Maximiano Coss, NP  ?NARCAN 4 MG/0.1ML LIQD nasal spray kit Place 1 spray into the nose as needed for opioid reversal. If no response within 3 minutes then repeat once in the other nostril.  Call 911. 05/15/20   [provider]  ?pregabalin (LYRICA) 50 MG capsule Take 100 mg by mouth at bedtime. 05/07/21   [provider]  ?rosuvastatin (CRESTOR) 10 MG tablet Take 1 tablet (10 mg total) by mouth daily. 02/14/21 06/19/21  Maximiano Coss, NP  ?sevelamer carbonate (RENVELA) 2.4 g PACK Take 2.4 g by mouth 3 (three) times daily with meals. 05/15/21   [provider]  ?VELPHORO 500 MG chewable tablet Chew 1,000 mg by mouth 3 (three) times daily. 05/19/21   [provider]  ?VITAMIN D PO Take 1 tablet by mouth daily.    [provider]  ?   ? ?Allergies    ?Shrimp [shellfish allergy], Bactroban [mupirocin], Vicodin [hydrocodone-acetaminophen], Eliquis [apixaban], Lisinopril, and Tylenol [acetaminophen]   ? ?Review of Systems   ?Review of Systems  ?Unable to perform ROS: Acuity of condition  ? ?Physical Exam ?Updated Vital Signs ?BP (!) 180/90 (BP Location: Left Arm)   Pulse 77   Temp 98.2 ?F (36.8 ?C)   Resp 18   Ht _0  (1.6 m)   Wt 46.5 kg   SpO2 98%   BMI 18.16  kg/m?  ?Physical Exam ?Vitals and nursing note reviewed.  ?Constitutional:   ?   General: She is not in acute distress. ?   Appearance: She is well-developed. She is ill-appearing. She is not diaphoretic.  ?HENT:  ?   Head: Normocephalic and atraumatic.  ?Eyes:  ?   Conjunctiva/sclera: Conjunctivae normal.  ?Cardiovascular:  ?   Rate and Rhythm: Normal rate and regular rhythm.  ?Pulmonary:  ?   Effort: Pulmonary effort is normal. No respiratory distress.  ?   Breath sounds: Normal breath sounds. No stridor.  ?Abdominal:  ?   General: There is no distension.  ?Skin: ?   General: Skin is warm and dry.  ?Neurological:  ?   Mental Status: She is alert and  oriented to person, place, and time.  ?   Cranial Nerves: No cranial nerve deficit.  ?Psychiatric:     ?   Behavior: Behavior is withdrawn.     ?   Cognition and Memory: Cognition is impaired.  ? ? ?ED Results / Procedures / Treatments   ?Labs ?(all labs ordered are listed, but only abnormal results are displayed) ?Labs Reviewed  ?URINE CULTURE - Abnormal; Notable for the following components:  ?    Result Value  ? Culture MULTIPLE SPECIES PRESENT, SUGGEST RECOLLECTION (*)   ? All other components within normal limits  ?BASIC METABOLIC PANEL - Abnormal; Notable for the following components:  ? Potassium 6.7 (*)   ? CO2 21 (*)   ? Glucose, Bld 116 (*)   ? BUN 155 (*)   ? Creatinine, Ser 19.69 (*)   ? GFR, Estimated 2 (*)   ? Anion gap 19 (*)   ? All other components within normal limits  ?CBC - Abnormal; Notable for the following components:  ? RBC 3.64 (*)   ? Hemoglobin 11.1 (*)   ? HCT 34.4 (*)   ? RDW 16.3 (*)   ? All other components within normal limits  ?URINALYSIS, ROUTINE W REFLEX MICROSCOPIC - Abnormal; Notable for the following components:  ? APPearance CLOUDY (*)   ? Glucose, UA 50 (*)   ? Hgb urine dipstick SMALL (*)   ? Protein, ur >=300 (*)   ? Leukocytes,Ua LARGE (*)   ? WBC, UA >50 (*)   ? Bacteria, UA FEW (*)   ? All other components within normal limits  ?RAPID URINE DRUG SCREEN, HOSP PERFORMED - Abnormal; Notable for the following components:  ? Cocaine POSITIVE (*)   ? All other components within normal limits  ?BASIC METABOLIC PANEL - Abnormal; Notable for the following components:  ? Potassium 5.2 (*)   ? CO2 21 (*)   ? Glucose, Bld 182 (*)   ? BUN 150 (*)   ? Creatinine, Ser 19.09 (*)   ? GFR, Estimated 2 (*)   ? Anion gap 23 (*)   ? All other components within normal limits  ?COMPREHENSIVE METABOLIC PANEL - Abnormal; Notable for the following components:  ? Potassium 2.9 (*)   ? BUN 39 (*)   ? Creatinine, Ser 6.72 (*)   ? Calcium 8.0 (*)   ? Albumin 3.3 (*)   ? GFR, Estimated 6 (*)   ? All  other components within normal limits  ?BASIC METABOLIC PANEL - Abnormal; Notable for the following components:  ? Chloride 96 (*)   ? Glucose, Bld 109 (*)   ? BUN 47 (*)   ? Creatinine, Ser 8.79 (*)   ? Calcium 8.2 (*)   ?  GFR, Estimated 5 (*)   ? All other components within normal limits  ?PROTIME-INR - Abnormal; Notable for the following components:  ? Prothrombin Time 19.8 (*)   ? INR 1.7 (*)   ? All other components within normal limits  ?RENAL FUNCTION PANEL - Abnormal; Notable for the following components:  ? BUN 58 (*)   ? Creatinine, Ser 10.52 (*)   ? Calcium 8.4 (*)   ? Phosphorus 7.4 (*)   ? Albumin 2.8 (*)   ? GFR, Estimated 4 (*)   ? All other components within normal limits  ?CBC - Abnormal; Notable for the following components:  ? RBC 3.42 (*)   ? Hemoglobin 10.2 (*)   ? HCT 31.7 (*)   ? RDW 16.1 (*)   ? All other components within normal limits  ?BASIC METABOLIC PANEL - Abnormal; Notable for the following components:  ? Glucose, Bld 107 (*)   ? BUN 35 (*)   ? Creatinine, Ser 7.71 (*)   ? GFR, Estimated 5 (*)   ? All other components within normal limits  ?BASIC METABOLIC PANEL - Abnormal; Notable for the following components:  ? BUN 54 (*)   ? Creatinine, Ser 9.49 (*)   ? GFR, Estimated 4 (*)   ? All other components within normal limits  ?CBG MONITORING, ED - Abnormal; Notable for the following components:  ? Glucose-Capillary 115 (*)   ? All other components within normal limits  ?TROPONIN I (HIGH SENSITIVITY) - Abnormal; Notable for the following components:  ? Troponin I (High Sensitivity) 183 (*)   ? All other components within normal limits  ?TROPONIN I (HIGH SENSITIVITY) - Abnormal; Notable for the following components:  ? Troponin I (High Sensitivity) 176 (*)   ? All other components within normal limits  ?RESP PANEL BY RT-PCR (FLU A&B, COVID) [id number]  ?MRSA NEXT GEN BY PCR, NASAL  ?HEPATITIS B CORE ANTIBODY, TOTAL  ?HEPATITIS B SURFACE ANTIBODY, QUANTITATIVE  ?HEPATITIS B SURFACE ANTIGEN   ?MAGNESIUM  ? ? ?EKG ?EKG Interpretation ? ?Date/Time:  Tuesday August 12 2021 11:58:42 EDT ?Ventricular Rate:  67 ?PR Interval:  170 ?QRS Duration: 88 ?QT Interval:  446 ?QTC Calculation: 471 ?R Axis:   -51 ?Text Inter

## 2021-08-23 ENCOUNTER — Encounter (HOSPITAL_COMMUNITY): Payer: Self-pay | Admitting: Student

## 2021-08-23 ENCOUNTER — Observation Stay (HOSPITAL_COMMUNITY): Payer: Medicare Other

## 2021-08-23 ENCOUNTER — Observation Stay (HOSPITAL_COMMUNITY)
Admission: EM | Admit: 2021-08-23 | Discharge: 2021-08-24 | Disposition: A | Discharge status: Home Health | Payer: Medicare Other | Attending: Family Medicine | Admitting: Family Medicine

## 2021-08-23 ENCOUNTER — Other Ambulatory Visit: Payer: Self-pay

## 2021-08-23 ENCOUNTER — Emergency Department (HOSPITAL_COMMUNITY): Payer: Medicare Other

## 2021-08-23 DIAGNOSIS — D649 Anemia, unspecified: Secondary | ICD-10-CM | POA: Diagnosis not present

## 2021-08-23 DIAGNOSIS — Z853 Personal history of malignant neoplasm of breast: Secondary | ICD-10-CM | POA: Insufficient documentation

## 2021-08-23 DIAGNOSIS — I160 Hypertensive urgency: Secondary | ICD-10-CM

## 2021-08-23 DIAGNOSIS — Z992 Dependence on renal dialysis: Secondary | ICD-10-CM | POA: Insufficient documentation

## 2021-08-23 DIAGNOSIS — Z20822 Contact with and (suspected) exposure to covid-19: Secondary | ICD-10-CM | POA: Diagnosis not present

## 2021-08-23 DIAGNOSIS — I480 Paroxysmal atrial fibrillation: Secondary | ICD-10-CM | POA: Insufficient documentation

## 2021-08-23 DIAGNOSIS — Z7901 Long term (current) use of anticoagulants: Secondary | ICD-10-CM | POA: Diagnosis not present

## 2021-08-23 DIAGNOSIS — R519 Headache, unspecified: Secondary | ICD-10-CM | POA: Insufficient documentation

## 2021-08-23 DIAGNOSIS — R778 Other specified abnormalities of plasma proteins: Secondary | ICD-10-CM | POA: Diagnosis not present

## 2021-08-23 DIAGNOSIS — J449 Chronic obstructive pulmonary disease, unspecified: Secondary | ICD-10-CM | POA: Insufficient documentation

## 2021-08-23 DIAGNOSIS — I482 Chronic atrial fibrillation, unspecified: Secondary | ICD-10-CM | POA: Insufficient documentation

## 2021-08-23 DIAGNOSIS — Z87891 Personal history of nicotine dependence: Secondary | ICD-10-CM | POA: Insufficient documentation

## 2021-08-23 DIAGNOSIS — R079 Chest pain, unspecified: Secondary | ICD-10-CM | POA: Diagnosis present

## 2021-08-23 DIAGNOSIS — I10 Essential (primary) hypertension: Secondary | ICD-10-CM

## 2021-08-23 DIAGNOSIS — N186 End stage renal disease: Secondary | ICD-10-CM | POA: Diagnosis not present

## 2021-08-23 DIAGNOSIS — Z79899 Other long term (current) drug therapy: Secondary | ICD-10-CM | POA: Diagnosis not present

## 2021-08-23 DIAGNOSIS — I120 Hypertensive chronic kidney disease with stage 5 chronic kidney disease or end stage renal disease: Principal | ICD-10-CM | POA: Insufficient documentation

## 2021-08-23 DIAGNOSIS — I161 Hypertensive emergency: Secondary | ICD-10-CM | POA: Diagnosis present

## 2021-08-23 LAB — COMPREHENSIVE METABOLIC PANEL
ALT: 29 U/L (ref 0–44)
AST: 20 U/L (ref 15–41)
Albumin: 3.4 g/dL — ABNORMAL LOW (ref 3.5–5.0)
Alkaline Phosphatase: 137 U/L — ABNORMAL HIGH (ref 38–126)
Anion gap: 12 (ref 5–15)
BUN: 31 mg/dL — ABNORMAL HIGH (ref 8–23)
CO2: 28 mmol/L (ref 22–32)
Calcium: 9.9 mg/dL (ref 8.9–10.3)
Chloride: 101 mmol/L (ref 98–111)
Creatinine, Ser: 6.59 mg/dL — ABNORMAL HIGH (ref 0.44–1.00)
GFR, Estimated: 6 mL/min — ABNORMAL LOW (ref >60)
Glucose, Bld: 114 mg/dL — ABNORMAL HIGH (ref 70–99)
Potassium: 4.4 mmol/L (ref 3.5–5.1)
Sodium: 141 mmol/L (ref 135–145)
Total Bilirubin: 0.7 mg/dL (ref 0.3–1.2)
Total Protein: 7 g/dL (ref 6.5–8.1)

## 2021-08-23 LAB — CBC WITH DIFFERENTIAL/PLATELET
Abs Immature Granulocytes: 0.02 10*3/uL (ref 0.00–0.07)
Basophils Absolute: 0.1 10*3/uL (ref 0.0–0.1)
Basophils Relative: 1 %
Eosinophils Absolute: 0.2 10*3/uL (ref 0.0–0.5)
Eosinophils Relative: 3 %
HCT: 31.4 % — ABNORMAL LOW (ref 36.0–46.0)
Hemoglobin: 9.9 g/dL — ABNORMAL LOW (ref 12.0–15.0)
Immature Granulocytes: 0 %
Lymphocytes Relative: 16 %
Lymphs Abs: 1 10*3/uL (ref 0.7–4.0)
MCH: 29.7 pg (ref 26.0–34.0)
MCHC: 31.5 g/dL (ref 30.0–36.0)
MCV: 94.3 fL (ref 80.0–100.0)
Monocytes Absolute: 0.6 10*3/uL (ref 0.1–1.0)
Monocytes Relative: 10 %
Neutro Abs: 4.2 10*3/uL (ref 1.7–7.7)
Neutrophils Relative %: 70 %
Platelets: 255 10*3/uL (ref 150–400)
RBC: 3.33 MIL/uL — ABNORMAL LOW (ref 3.87–5.11)
RDW: 15.7 % — ABNORMAL HIGH (ref 11.5–15.5)
WBC: 6 10*3/uL (ref 4.0–10.5)
nRBC: 0 % (ref 0.0–0.2)

## 2021-08-23 LAB — TROPONIN I (HIGH SENSITIVITY)
Troponin I (High Sensitivity): 166 ng/L (ref <18)
Troponin I (High Sensitivity): 175 ng/L (ref <18)

## 2021-08-23 LAB — RESP PANEL BY RT-PCR (FLU A&B, COVID) ARPGX2
Influenza A by PCR: NEGATIVE
Influenza B by PCR: NEGATIVE
SARS Coronavirus 2 by RT PCR: NEGATIVE

## 2021-08-23 MED ORDER — APIXABAN 2.5 MG PO TABS
2.5000 mg | ORAL_TABLET | Freq: Two times a day (BID) | ORAL | Status: DC
  Administered 2021-08-23 – 2021-08-24 (×2): 2.5 mg via ORAL
  Filled 2021-08-23 (×2): qty 1

## 2021-08-23 MED ORDER — HEPARIN SODIUM (PORCINE) 1000 UNIT/ML IJ SOLN
INTRAMUSCULAR | Status: AC
  Administered 2021-08-23: 1000 [IU]
  Filled 2021-08-23: qty 4

## 2021-08-23 MED ORDER — CHLORHEXIDINE GLUCONATE CLOTH 2 % EX PADS
6.0000 | MEDICATED_PAD | Freq: Every day | CUTANEOUS | Status: DC
  Administered 2021-08-24: 6 via TOPICAL

## 2021-08-23 MED ORDER — AMLODIPINE BESYLATE 5 MG PO TABS
10.0000 mg | ORAL_TABLET | Freq: Every day | ORAL | Status: DC
Start: 2021-08-23 — End: 2021-08-23

## 2021-08-23 MED ORDER — ISOSORBIDE MONONITRATE ER 30 MG PO TB24
30.0000 mg | ORAL_TABLET | Freq: Every day | ORAL | Status: DC
  Administered 2021-08-23 – 2021-08-24 (×2): 30 mg via ORAL
  Filled 2021-08-23 (×3): qty 1

## 2021-08-23 MED ORDER — HYDRALAZINE HCL 20 MG/ML IJ SOLN
20.0000 mg | Freq: Once | INTRAMUSCULAR | Status: AC
  Administered 2021-08-23: 20 mg via INTRAVENOUS
  Filled 2021-08-23: qty 1

## 2021-08-23 MED ORDER — SEVELAMER CARBONATE 2.4 G PO PACK
2.4000 g | PACK | Freq: Three times a day (TID) | ORAL | Status: DC
  Administered 2021-08-24 (×2): 2.4 g via ORAL
  Filled 2021-08-23 (×5): qty 1

## 2021-08-23 MED ORDER — CLONIDINE HCL 0.1 MG/24HR TD PTWK
0.1000 mg | MEDICATED_PATCH | TRANSDERMAL | Status: DC
  Administered 2021-08-23: 0.1 mg via TRANSDERMAL
  Filled 2021-08-23: qty 1

## 2021-08-23 MED ORDER — LOSARTAN POTASSIUM 50 MG PO TABS
100.0000 mg | ORAL_TABLET | Freq: Every day | ORAL | Status: DC
  Administered 2021-08-23 – 2021-08-24 (×2): 100 mg via ORAL
  Filled 2021-08-23 (×2): qty 2

## 2021-08-23 MED ORDER — ALBUTEROL SULFATE (2.5 MG/3ML) 0.083% IN NEBU: 2.5000 mg | INHALATION_SOLUTION | Freq: Four times a day (QID) | RESPIRATORY_TRACT | Status: DC | PRN

## 2021-08-23 MED ORDER — VITAMIN D 25 MCG (1000 UNIT) PO TABS
1000.0000 [IU] | ORAL_TABLET | Freq: Every day | ORAL | Status: DC
Start: 2021-08-23 — End: 2021-08-24
  Administered 2021-08-23 – 2021-08-24 (×2): 1000 [IU] via ORAL
  Filled 2021-08-23 (×2): qty 1

## 2021-08-23 MED ORDER — DICLOFENAC SODIUM 1 % EX GEL: 2.0000 g | Freq: Two times a day (BID) | CUTANEOUS | Status: DC | PRN

## 2021-08-23 MED ORDER — SUCROFERRIC OXYHYDROXIDE 500 MG PO CHEW
1000.0000 mg | CHEWABLE_TABLET | Freq: Three times a day (TID) | ORAL | Status: DC
Start: 2021-08-23 — End: 2021-08-24
  Filled 2021-08-23 (×4): qty 2

## 2021-08-23 MED ORDER — ALBUTEROL SULFATE HFA 108 (90 BASE) MCG/ACT IN AERS: 2.0000 | INHALATION_SPRAY | Freq: Four times a day (QID) | RESPIRATORY_TRACT | Status: DC | PRN

## 2021-08-23 MED ORDER — CLONIDINE HCL 0.2 MG PO TABS
0.2000 mg | ORAL_TABLET | Freq: Once | ORAL | Status: AC
  Administered 2021-08-23: 0.2 mg via ORAL
  Filled 2021-08-23: qty 1

## 2021-08-23 MED ORDER — AMLODIPINE BESYLATE 10 MG PO TABS
10.0000 mg | ORAL_TABLET | Freq: Every day | ORAL | Status: DC
  Administered 2021-08-23 – 2021-08-24 (×2): 10 mg via ORAL
  Filled 2021-08-23: qty 1
  Filled 2021-08-23: qty 2

## 2021-08-23 MED ORDER — LORAZEPAM 1 MG PO TABS
1.0000 mg | ORAL_TABLET | Freq: Once | ORAL | Status: AC
Start: 2021-08-23 — End: 2021-08-23
  Administered 2021-08-23: 1 mg via ORAL
  Filled 2021-08-23: qty 1

## 2021-08-23 MED ORDER — ROSUVASTATIN CALCIUM 5 MG PO TABS
10.0000 mg | ORAL_TABLET | Freq: Every day | ORAL | Status: DC
  Administered 2021-08-23 – 2021-08-24 (×2): 10 mg via ORAL
  Filled 2021-08-23 (×2): qty 2

## 2021-08-23 MED ORDER — HYDRALAZINE HCL 50 MG PO TABS
50.0000 mg | ORAL_TABLET | Freq: Three times a day (TID) | ORAL | Status: DC
  Administered 2021-08-23 – 2021-08-24 (×4): 50 mg via ORAL
  Filled 2021-08-23 (×2): qty 1
  Filled 2021-08-23: qty 2
  Filled 2021-08-23: qty 1

## 2021-08-23 NOTE — ED Notes (Signed)
Pt educated that breathing rapidly is not a good way to improve her breathing. Pt walked through breathing exercises to help with breathing and anxiety  ?

## 2021-08-23 NOTE — Hospital Course (Signed)
Monique Henderson is a 71 y.o.female with a history of ESRD on HD, HTN, GERD, tobacco use disorder, aortic dissection status post aortic aneurysm repair, paroxysmal A-fib with RVR, medication noncompliance who was admitted to the family practice teaching Service at [facility] for hypertensive emergency. Her hospital course is detailed below: ? ?Hypertensive emergency ?Presented with palpable chest pain and blood pressures in the 210s/110s.  Troponins elevated to 166> 175 which is at baseline from prior admission.  Received clonidine 0.2 mg and IV hydralazine 20 mg in the ED. CXR showed cardiomegaly with vascular congestion, progressed since the prior radiograph. No focal consolidation.  EKG NSR without ST elevation.  Patient endorsed not taking medications and given chest pain worsened with palpation believed to be costochondritis.  Patient was restarted on home medications and discharged with instructions to take*** ? ?Other chronic conditions were medically managed with home medications and formulary alternatives as necessary (COPD, HLD, PAF) ? ?PCP Follow-up Recommendations: ? ?

## 2021-08-23 NOTE — Progress Notes (Signed)
Went to round on patient at the start of night shift.  She was asleep so I did not wake her.  She appeared in no distress with vital showing no concerns at this time. ? ?General: Sleeping comfortably in no acute distress ? ?A/P ?We will continue to monitor throughout the night.  No concerns at this time.  Likely approaching date of discharge and may discharge tomorrow.  We will continue to watch her blood pressures. ?

## 2021-08-23 NOTE — H&P (Signed)
Family Medicine Teaching Service ?Hospital Admission History and Physical ?Service Pager: [phone] ? ?Patient name: Monique Henderson Medical record number: [id number] ?Date of birth: 06-13-1950 Age: 71 y.o. Gender: female ? ?Primary Care Provider: Myrtie Neither, PA-C ?Consultants: nephrology ?Code Status: FULL ?Preferred Emergency Contact: Retta Diones, sister ?Contact Information   ? ? Name Relation Home Work Mobile  ? Fox,Lurlene Sister   [phone]  ? Teti,Steve Significant other   [phone]  ? Pixie, Burgener Son   [phone]  ? ?  ?  ?Chief Complaint: Chest pain ? ?Assessment and Plan: ?[name] is a 71 y.o. female presenting with chest pain. 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 ? ?Chest pain  costochondritis  Hypertensive emergency ?Presented with blood pressures in the 210s/110s. Trops elevated to 166>175.  CXR showed cardiomegaly with vascular congestion, progressed since the prior radiograph. No focal consolidation.  Patient received clonidine 0.2 mg orally and IV hydralazine 20 mg in the ED.  Does not endorse taking medications yesterday because she was experiencing chest pain.  Blood pressure poorly controlled due to noncompliance.  EKG without ST elevation. Given troponins are similar to last discharge 1 week prior, EKG unremarkable, and not presenting with focal deficits of radiating pain, vision changes, do not suspect MI at this time. However patient does present with headache without signs of trauma but will obtain CT head for bleed prior to restarting anticoagulants.  Additionally presents with tenderness to palpation over sternal border more consistent with costochondritis. Unable to give oral NSAIDs given ESRD status.  Home blood pressure medications are amlodipine 10 mg daily, hydralazine 50 mg 3 times daily, losartan 100  mg daily, Imdur 30 mg daily, clonidine 0.1 mg patch weekly.  Will admit for observation and monitor blood pressure control with restarting home medications and receiving dialysis today. ?-Admitted to [facility], attending Dr. Erin Hearing, progressive unit for observation ?-CCM x24 hours ?-Vital signs per unit with pulse ox check ?-Neurochecks every 4 hours ?-CT head without contrast ?-Restart amlodipine 10 mg daily, losartan 100 mg daily, hydralazine 50 mg 3 times daily ?-Voltaren gel twice daily as needed ?-UA with UDS ? ?Headache ?Without tenderness to palpation or change in vision.  Eyes equal and reactive to light.  Patient has history of frequent headaches and was switched to Imdur during prior hospitalization for this.  Unfortunately this is unable to help her due to medication noncompliance.  As mentioned above, will obtain CT head to assess for bleed prior to restarting anticoagulation. ?-CT head without contrast ? ?ESRD on HD Tu/Th/Sa ?BMP without electrolyte abnormalities.  Did receive dialysis at the appropriate day on Thursday.  CXR showed cardiomegaly with vascular congestion, progressed since the prior radiograph. No focal consolidation. ?-Nephrology consulted, appreciate efforts ?-HD today ? ?Anemia of chronic disease: Chronic, stable ?Likely secondary to ESRD.  Baseline appears to be 10-11. ?Hemoglobin stable at 9.9 today.  No signs of acute bleed. ? ?COPD: Chronic, stable ?No oxygen requirement at this time and satting well on room air.  Home medications include albuterol every 6 hours as needed. ?-Continue albuterol every 6 hours as needed ? ?HLD ?LDL 40 according to last lipid panel on 06/21/2021.  Home medications include Crestor 10 mg daily. ?-Continue Crestor 10 mg daily ? ?PAF with RVR ?Home medications include Eliquis 2.5 mg twice daily. ?-Hold home Eliquis until CT head returns ? ?FEN/GI: Renal with  fluid restriction ?Prophylaxis: SCDs, adjust after CT head results ? ?Disposition: Progressive unit  for observation ? ?History of Present Illness:  Monique Henderson is a 71 y.o. female presenting with chest pain. ? ?Patient reports non-radiating chest pain under her breast started last night and is worse with coughing. She reports associated SOB. She reports she went to dialysis as scheduled this past Thursday. She states she did not take her medications yesterday due to the pain but did take her medications the day prior. Reports headaches which she has often. Denies arm/shoulder pain, abdominal pain, back pain, visual changes. Reports she still makes urine. ? ?Review Of Systems: Per HPI with the following additions: ? ?Review of Systems  ?Eyes:  Negative for visual disturbance.  ?Respiratory:  Positive for cough and shortness of breath.   ?Cardiovascular:  Positive for chest pain.  ?Gastrointestinal:  Negative for abdominal pain.  ?Genitourinary:  Negative for dysuria.  ?Musculoskeletal:  Negative for back pain.  ?Neurological:  Positive for headaches.   ? ?Patient Active Problem List  ? Diagnosis Date Noted  ? Altered mental status   ? ESRD (end stage renal disease) on dialysis ([place]) 08/12/2021  ? Malignant hypertension 06/19/2021  ? Anemia 06/05/2021  ? Pure hypercholesterolemia 02/14/2021  ? Acute pulmonary edema ([place]) 12/23/2020  ? Acute respiratory failure with hypoxia ([place]) 09/21/2020  ? Acute renal failure superimposed on chronic kidney disease ([place]) 09/21/2020  ? Community acquired pneumonia 09/21/2020  ? Uremia 09/21/2020  ? Metabolic acidosis 97/35/3299  ? Hypertensive crisis 09/21/2020  ? On Coumadin for atrial fibrillation ([place]) 10/03/2019  ? Troponin level elevated 08/28/2019  ? Positive D dimer 08/28/2019  ? Hyperkalemia 08/28/2019  ? SVT (supraventricular tachycardia) ([place]) 08/28/2019  ? SIRS (systemic inflammatory response syndrome) ([place]) 08/27/2019  ? Paroxysmal SVT (supraventricular tachycardia) ([place]) 08/27/2019  ? Aortic atherosclerosis ([place]) 07/05/2019  ? Chest pain 07/05/2019  ? History of  CVA (cerebrovascular accident) 05/29/2019  ? AF (paroxysmal atrial fibrillation) ([place]) 05/29/2019  ? ESRD on dialysis [facility]) 05/29/2019  ? Cerebral thrombosis with cerebral infarction 05/22/2019  ? Malnutrition of moderate degree 05/19/2019  ? Pleural effusion 05/18/2019  ? Atrial fibrillation with RVR ([place]) 05/18/2019  ? S/P aortic aneurysm repair 04/07/2019  ? Aortic dissection ([place]) 04/04/2019  ? Dissection of aorta ([place]) 04/03/2019  ? Non compliance with medical treatment 12/04/2017  ? CKD (chronic kidney disease), stage IV ([place]) 07/19/2017  ? Chronic obstructive lung disease ([place]) 01/16/2017  ? Chronic low back pain 06/22/2016  ? Insomnia 03/14/2015  ? S/P lumbar spinal fusion 01/18/2014  ? Tobacco use disorder 04/19/2009  ? Essential hypertension 09/16/2006  ? GERD 09/16/2006  ? ? ?Past Medical History: ?Past Medical History:  ?Diagnosis Date  ? AF (paroxysmal atrial fibrillation) ([place]) 05/29/2019  ? on Coumadin  ? Aortic atherosclerosis ([place]) 07/05/2019  ? Aortic dissection ([place]) 04/04/2019  ? s/p repair  ? Bone spur 2008  ? Right calcaneal foot spur  ? Breast cancer ([place]) 2004  ? Ductal carcinoma in situ of the left breast; S/P left partial mastectomy 02/26/2003; S/P re-excision of cranial and lateral margins11/18/2004.radiation  ? Cerebral thrombosis with cerebral infarction 05/22/2019  ? Chronic low back pain 06/22/2016  ? Chronic obstructive lung disease ([place]) 01/16/2017  ? 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 S/P breast lumpectomy 10/13/2012 by Dr. Autumn Messing; S/P re-excision of superior and inferior margins 10/27/2012.   ? ESRD on dialysis [facility]) 05/29/2019  ? Essential hypertension 09/16/2006  ?  GERD 09/16/2006  ? Hepatitis C   ? treated and RNA confirmed not detectable 01/2017  ? Insomnia 03/14/2015  ? Malnutrition of moderate degree 05/19/2019  ? Non compliance with medical treatment 12/04/2017  ? Normocytic anemia   ? With thrombocytosis  ? Osteoarthritis   ? Right ureteral stone  2002  ? S/P lumbar spinal fusion 01/18/2014  ? S/P lumbar decompressive laminectomy, fusion, and plating for lumbar spinal stenosis on 05/27/2009 by Dr. Eustace Moore.  S/P anterolateral retroperitoneal

## 2021-08-23 NOTE — Procedures (Signed)
? ?  I was present at this dialysis session, have reviewed the session itself and made  appropriate changes ?Kelly Splinter MD ?[employer] ?pager [phone]   ?08/23/2021, 3:51 PM ? ? ?

## 2021-08-23 NOTE — Plan of Care (Signed)
?  Problem: Education: ?Goal: Knowledge of General Education information will improve ?Description: Including pain rating scale, medication(s)/side effects and non-pharmacologic comfort measures ?Outcome: Progressing ?  ?Problem: Health Behavior/Discharge Planning: ?Goal: Ability to manage health-related needs will improve ?Outcome: Progressing ?  ?Problem: Clinical Measurements: ?Goal: Respiratory complications will improve ?Outcome: Progressing ?  ?Problem: Nutrition: ?Goal: Adequate nutrition will be maintained ?Outcome: Progressing ?  ?Problem: Pain Managment: ?Goal: General experience of comfort will improve ?Outcome: Progressing ?  ?Problem: Safety: ?Goal: Ability to remain free from injury will improve ?Outcome: Progressing ?  ?Problem: Skin Integrity: ?Goal: Risk for impaired skin integrity will decrease ?Outcome: Progressing ?  ?

## 2021-08-23 NOTE — Progress Notes (Signed)
[facility] ?Progress Note ? ?Interim Summary: Pt recently admitted 3/14 - 18 for uremic AMS due to missing HD x 1 week. She had HD in hospital and was dc'd on 3/18 after HD. Also uncont HTN which improved, PAF w/ RVR changed from coumadin to eliquis. Presented last night w/ SOB and CP. Last HD was on 3/23. In ED BP was 220/ 112. She rec'd po clonidine, IV hydralazine and IV ativan. We are asked to see for ESRD.  ? ?Subjective: Pt seen in ED, getting ready to go up for HD.  She denies any CP or SOB at this time.  ? ?Vitals:  ? 08/23/21 0630 08/23/21 0645 08/23/21 0700 08/23/21 0724  ?BP: (!) 223/120 (!) 212/129 (!) 215/149 (!) 216/109  ?Pulse: (!) 103 (!) 107 96   ?Resp: (!) 32 (!) 30 (!) 27   ?Temp:      ?TempSrc:      ?SpO2: 100% 93% 96%   ?Weight:      ?Height:      ? ? ?Exam: ?General: Ill-appearing; NAD ?Heart: S1 and S2; No murmurs, gallops, or rubs ?Lungs: Clear throughout ?Abdomen: Soft and non-tender ?Extremities: No edema ?Dialysis Access: R TDC  ? ? ?   Home meds: norvasc 10, hydralazine 50 tid, losartan 100, remeron, lyrica, crestor, renvela 3 ac tid, velphoro 1 gm tid ac, warfarin, prns/ vits/ supps ? ? ? ? OP HD: [place] ?  3h 63mn   43.5kg  400/1.5  2/2 bath  P2 Hep none  RIJ TDC ? - refuses perm access ? - mircera 150 ug q2, last 3/23, due 4/6 ? - hectorol 4 ug tiw IV ? ?   CXR 3/25 - IMPRESSION: Cardiomegaly with vascular congestion, progressed since the prior radiograph. No focal consolidation. ? ? ? ?Assessment/ Plan: ?SOB/ chest pain - CXR suggesting new pulm edema. Plan HD today, get volume down. UFG 3 L.  ?ESRD -on HD TTS. Plan for HD today as above.  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 Just got esa, next due on 4/6.  ?MBD ckd - CCa^ > hold vdra for now, add phos, cont renvela.  ?HTN  - noted high BP's, get vol down w/ HD. Cont home meds, prn IV/ po meds.  ?H/o aortic dissection ?COPD ?H/o CVA ? ? ? ? ? ?Rob SDoctor, hospital?08/23/2021, 8:32 AM ? ? ?Recent Labs  ?Lab 08/23/21 ?0125   ?HGB 9.9*  ?ALBUMIN 3.4*  ?CALCIUM 9.9  ?CREATININE 6.59*  ?K 4.4  ? ?Inpatient medications: ? ? ? ? ? ? ? ? ? ?

## 2021-08-23 NOTE — Progress Notes (Signed)
Patient is attached to telemetry during HD and had an episode of occasional PVC, couplet and sinus tachycardia.  ?

## 2021-08-23 NOTE — ED Notes (Signed)
Pt able to ambulate to the bathroom with no assistance and NAD. Pt also reported her clonidine patch fell off at home  ?

## 2021-08-23 NOTE — ED Provider Notes (Signed)
?[place] ?Provider Note ? ? ?CSN: [id number] ?Arrival date & time: 08/23/21  0044 ? ?  ? ?History ? ?Chief Complaint  ?Patient presents with  ? Hypertension  ? Chest Pain  ? ? ?Monique Henderson is a 71 y.o. female. ? ?Patient presents to the emergency department for evaluation of chest pain and shortness of breath.  Patient reports that symptoms have been present all day.  Patient is a dialysis patient, reports that her last scheduled dialysis was yesterday.  She has been compliant.  She also has been taking her normal medications, EMS report elevated blood pressure, normal oxygenation. ? ? ?  ? ?Home Medications ?Prior to Admission medications   ?Medication Sig Start Date End Date Taking? Authorizing Provider  ?albuterol (VENTOLIN HFA) 108 (90 Base) MCG/ACT inhaler Inhale 2 puffs into the lungs every 6 (six) hours as needed for wheezing or shortness of breath. 07/30/20   Maximiano Coss, NP  ?amLODipine (NORVASC) 10 MG tablet Take 1 tablet (10 mg total) by mouth daily. 02/14/21   Maximiano Coss, NP  ?apixaban (ELIQUIS) 2.5 MG TABS tablet Take 1 tablet (2.5 mg total) by mouth 2 (two) times daily. 06/09/21   Caren Griffins, MD  ?cloNIDine (CATAPRES - DOSED IN MG/24 HR) 0.1 mg/24hr patch Place 1 patch (0.1 mg total) onto the skin every Saturday. 06/14/21   Caren Griffins, MD  ?hydrALAZINE (APRESOLINE) 50 MG tablet Take 1 tablet (50 mg total) by mouth 3 (three) times daily. 06/21/21 07/21/21  Nolberto Hanlon, MD  ?isosorbide mononitrate (IMDUR) 30 MG 24 hr tablet Take 1 tablet (30 mg total) by mouth daily. 08/17/21   Shary Key, DO  ?loperamide (IMODIUM) 2 MG capsule Take 2 mg by mouth as needed for diarrhea or loose stools. 07/15/20   [provider]  ?losartan (COZAAR) 100 MG tablet Take 1 tablet (100 mg total) by mouth daily. 02/14/21   Maximiano Coss, NP  ?mirtazapine (REMERON) 7.5 MG tablet Take 1 tablet (7.5 mg total) by mouth at bedtime. ?Patient taking  differently: Take 7.5 mg by mouth at bedtime as needed (sleep). 07/15/20   Maximiano Coss, NP  ?NARCAN 4 MG/0.1ML LIQD nasal spray kit Place 1 spray into the nose as needed for opioid reversal. If no response within 3 minutes then repeat once in the other nostril.  Call 911. 05/15/20   [provider]  ?pregabalin (LYRICA) 50 MG capsule Take 100 mg by mouth at bedtime. 05/07/21   [provider]  ?rosuvastatin (CRESTOR) 10 MG tablet Take 1 tablet (10 mg total) by mouth daily. 02/14/21 06/19/21  Maximiano Coss, NP  ?sevelamer carbonate (RENVELA) 2.4 g PACK Take 2.4 g by mouth 3 (three) times daily with meals. 05/15/21   [provider]  ?VELPHORO 500 MG chewable tablet Chew 1,000 mg by mouth 3 (three) times daily. 05/19/21   [provider]  ?VITAMIN D PO Take 1 tablet by mouth daily.    [provider]  ?   ? ?Allergies    ?Shrimp [shellfish allergy], Bactroban [mupirocin], Vicodin [hydrocodone-acetaminophen], Eliquis [apixaban], Lisinopril, and Tylenol [acetaminophen]   ? ?Review of Systems   ?Review of Systems  ?Respiratory:  Positive for shortness of breath.   ?Cardiovascular:  Positive for chest pain.  ? ?Physical Exam ?Updated Vital Signs ?BP (!) 223/120   Pulse (!) 103   Temp 99.4 ?F (37.4 ?C) (Oral)   Resp (!) 32   Ht 5' 3"  (1.6 m)   Wt  47.6 kg   SpO2 100%   BMI 18.60 kg/m?  ?Physical Exam ?Vitals and nursing note reviewed.  ?Constitutional:   ?   General: She is not in acute distress. ?   Appearance: She is well-developed.  ?HENT:  ?   Head: Normocephalic and atraumatic.  ?   Mouth/Throat:  ?   Mouth: Mucous membranes are moist.  ?Eyes:  ?   General: Vision grossly intact. Gaze aligned appropriately.  ?   Extraocular Movements: Extraocular movements intact.  ?   Conjunctiva/sclera: Conjunctivae normal.  ?Cardiovascular:  ?   Rate and Rhythm: Normal rate and regular rhythm.  ?   Pulses: Normal pulses.  ?   Heart sounds: Normal heart sounds, S1 normal and S2  normal. No murmur heard. ?  No friction rub. No gallop.  ?Pulmonary:  ?   Effort: Pulmonary effort is normal. No respiratory distress.  ?   Breath sounds: Normal breath sounds.  ?Abdominal:  ?   General: Bowel sounds are normal.  ?   Palpations: Abdomen is soft.  ?   Tenderness: There is no abdominal tenderness. There is no guarding or rebound.  ?   Hernia: No hernia is present.  ?Musculoskeletal:     ?   General: No swelling.  ?   Cervical back: Full passive range of motion without pain, normal range of motion and neck supple. No spinous process tenderness or muscular tenderness. Normal range of motion.  ?   Right lower leg: No edema.  ?   Left lower leg: No edema.  ?Skin: ?   General: Skin is warm and dry.  ?   Capillary Refill: Capillary refill takes less than 2 seconds.  ?   Findings: No ecchymosis, erythema, rash or wound.  ?Neurological:  ?   General: No focal deficit present.  ?   Mental Status: She is alert and oriented to person, place, and time.  ?   GCS: GCS eye subscore is 4. GCS verbal subscore is 5. GCS motor subscore is 6.  ?   Cranial Nerves: Cranial nerves 2-12 are intact.  ?   Sensory: Sensation is intact.  ?   Motor: Motor function is intact.  ?   Coordination: Coordination is intact.  ?Psychiatric:     ?   Attention and Perception: Attention normal.     ?   Mood and Affect: Mood normal.     ?   Speech: Speech normal.     ?   Behavior: Behavior normal.  ? ? ?ED Results / Procedures / Treatments   ?Labs ?(all labs ordered are listed, but only abnormal results are displayed) ?Labs Reviewed  ?CBC WITH DIFFERENTIAL/PLATELET - Abnormal; Notable for the following components:  ?    Result Value  ? RBC 3.33 (*)   ? Hemoglobin 9.9 (*)   ? HCT 31.4 (*)   ? RDW 15.7 (*)   ? All other components within normal limits  ?COMPREHENSIVE METABOLIC PANEL - Abnormal; Notable for the following components:  ? Glucose, Bld 114 (*)   ? BUN 31 (*)   ? Creatinine, Ser 6.59 (*)   ? Albumin 3.4 (*)   ? Alkaline Phosphatase  137 (*)   ? GFR, Estimated 6 (*)   ? All other components within normal limits  ?TROPONIN I (HIGH SENSITIVITY) - Abnormal; Notable for the following components:  ? Troponin I (High Sensitivity) 166 (*)   ? All other components within normal limits  ?TROPONIN I (HIGH SENSITIVITY) -  Abnormal; Notable for the following components:  ? Troponin I (High Sensitivity) 175 (*)   ? All other components within normal limits  ? ? ?EKG ?EKG Interpretation ? ?Date/Time:  Saturday August 23 2021 00:44:14 EDT ?Ventricular Rate:  89 ?PR Interval:  151 ?QRS Duration: 82 ?QT Interval:  385 ?QTC Calculation: 469 ?R Axis:   -70 ?Text Interpretation: Sinus rhythm Biatrial enlargement Left anterior fascicular block LVH with secondary repolarization abnormality No significant change since last tracing Confirmed by [name] [phone]) on 08/23/2021 1:02:20 AM ? ?Radiology ?DG Chest Port 1 View ? ?Result Date: 08/23/2021 ?CLINICAL DATA:  Chest pain and shortness of breath. EXAM: PORTABLE CHEST 1 VIEW COMPARISON:  Chest radiograph dated 08/12/2021. FINDINGS: Dialysis catheter in similar position. There is cardiomegaly with vascular congestion, progressed since the prior radiograph. No focal consolidation, pleural effusion, pneumothorax. Median sternotomy wires. No acute osseous pathology. IMPRESSION: Cardiomegaly with vascular congestion, progressed since the prior radiograph. No focal consolidation. Electronically Signed   By: Anner Crete M.D.   On: 08/23/2021 02:07   ? ?Procedures ?Procedures  ? ? ?Medications Ordered in ED ?Medications  ?hydrALAZINE (APRESOLINE) injection 20 mg (20 mg Intravenous Given 08/23/21 0350)  ? ? ?ED Course/ Medical Decision Making/ A&P ?  ?                        ?Medical Decision Making ?Amount and/or Complexity of Data Reviewed ?Labs: ordered. ?Radiology: ordered. ? ?Risk ?Prescription drug management. ? ? ?Patient presents to the emergency department for evaluation of chest pain and shortness of  breath.  Patient has a history of end-stage renal disease, on hemodialysis.  She is due for dialysis later today. ? ?Patient reports that she did go to dialysis on Thursday but she does have a history of noncompliance

## 2021-08-23 NOTE — ED Triage Notes (Signed)
Pt BIB EMS from home for a difficulty breathing and CP that started this morning. Last dialysis was yesterday, treatment completed. Compliant with dialysis and HTN meds ? ?220/112 ?96% RA ?96 NSR ?28 RR ?

## 2021-08-23 NOTE — Progress Notes (Signed)
PT Cancellation Note ? ?Patient Details ?Name: Monique Henderson ?MRN: [id number] ?DOB: 1950/06/06 ? ? ?Cancelled Treatment:    Reason Eval/Treat Not Completed: Medical issues which prohibited therapy;Fatigue/lethargy limiting ability to participate Pt with elevated BP and very sleepy. Woke up when PT entered room and reported she wanted to sleep. Will follow up as pt appropriate.  ? ?Reuel Derby, PT, DPT  ?Acute Rehabilitation Services  ?Pager: [phone] ?Office: [phone] ? ? ? ?[name] ?08/23/2021, 10:10 AM ?

## 2021-08-24 ENCOUNTER — Observation Stay (HOSPITAL_COMMUNITY): Payer: Medicare Other

## 2021-08-24 DIAGNOSIS — I161 Hypertensive emergency: Secondary | ICD-10-CM | POA: Diagnosis not present

## 2021-08-24 DIAGNOSIS — Z20822 Contact with and (suspected) exposure to covid-19: Secondary | ICD-10-CM | POA: Diagnosis not present

## 2021-08-24 DIAGNOSIS — I120 Hypertensive chronic kidney disease with stage 5 chronic kidney disease or end stage renal disease: Secondary | ICD-10-CM | POA: Diagnosis not present

## 2021-08-24 DIAGNOSIS — N186 End stage renal disease: Secondary | ICD-10-CM | POA: Diagnosis not present

## 2021-08-24 LAB — BASIC METABOLIC PANEL
Anion gap: 10 (ref 5–15)
BUN: 18 mg/dL (ref 8–23)
CO2: 28 mmol/L (ref 22–32)
Calcium: 9.5 mg/dL (ref 8.9–10.3)
Chloride: 99 mmol/L (ref 98–111)
Creatinine, Ser: 3.97 mg/dL — ABNORMAL HIGH (ref 0.44–1.00)
GFR, Estimated: 12 mL/min — ABNORMAL LOW (ref >60)
Glucose, Bld: 106 mg/dL — ABNORMAL HIGH (ref 70–99)
Potassium: 3.9 mmol/L (ref 3.5–5.1)
Sodium: 137 mmol/L (ref 135–145)

## 2021-08-24 LAB — PHOSPHORUS: Phosphorus: 3.9 mg/dL (ref 2.5–4.6)

## 2021-08-24 MED ORDER — CARVEDILOL 3.125 MG PO TABS: 3.1250 mg | ORAL_TABLET | Freq: Two times a day (BID) | ORAL | 0 refills | Status: DC

## 2021-08-24 MED ORDER — OXYCODONE HCL 5 MG PO TABS
5.0000 mg | ORAL_TABLET | Freq: Once | ORAL | Status: AC
  Administered 2021-08-24: 5 mg via ORAL
  Filled 2021-08-24: qty 1

## 2021-08-24 MED ORDER — APIXABAN 2.5 MG PO TABS: 2.5000 mg | ORAL_TABLET | Freq: Two times a day (BID) | ORAL | 0 refills | Status: DC

## 2021-08-24 MED ORDER — ACETAMINOPHEN 325 MG PO TABS
650.0000 mg | ORAL_TABLET | Freq: Four times a day (QID) | ORAL | Status: DC | PRN
  Administered 2021-08-24 (×3): 650 mg via ORAL
  Filled 2021-08-24 (×3): qty 2

## 2021-08-24 MED ORDER — LABETALOL HCL 5 MG/ML IV SOLN
INTRAVENOUS | Status: AC
  Filled 2021-08-24: qty 4

## 2021-08-24 MED ORDER — LABETALOL HCL 5 MG/ML IV SOLN
10.0000 mg | Freq: Once | INTRAVENOUS | Status: AC
  Administered 2021-08-24: 10 mg via INTRAVENOUS

## 2021-08-24 MED ORDER — CARVEDILOL 3.125 MG PO TABS
3.1250 mg | ORAL_TABLET | Freq: Two times a day (BID) | ORAL | Status: DC
Start: 2021-08-24 — End: 2021-08-24
  Administered 2021-08-24: 3.125 mg via ORAL
  Filled 2021-08-24: qty 1

## 2021-08-24 NOTE — Progress Notes (Signed)
Telemetry monitoring called at this time stating patient cardiac monitoring was alarming [facility], however it seems to be a widened QRS vs. VTACH and possible BBB. Stat EKG ordered to verify rhythm. Provider notified.  ?

## 2021-08-24 NOTE — Care Management Obs Status (Signed)
MEDICARE OBSERVATION STATUS NOTIFICATION ? ? ?Patient Details  ?Name: Monique Henderson ?MRN: [id number] ?Date of Birth: 10-11-1950 ? ? ?Medicare Observation Status Notification Given:  Yes ? ? ? ?Carles Collet, RN ?08/24/2021, 10:28 AM ?

## 2021-08-24 NOTE — Plan of Care (Signed)
?  Problem: Education: ?Goal: Knowledge of General Education information will improve ?Description: Including pain rating scale, medication(s)/side effects and non-pharmacologic comfort measures ?Outcome: Completed/Met ?  ?Problem: Health Behavior/Discharge Planning: ?Goal: Ability to manage health-related needs will improve ?Outcome: Completed/Met ?  ?Problem: Clinical Measurements: ?Goal: Ability to maintain clinical measurements within normal limits will improve ?Outcome: Completed/Met ?Goal: Will remain free from infection ?Outcome: Completed/Met ?Goal: Diagnostic test results will improve ?Outcome: Completed/Met ?Goal: Respiratory complications will improve ?Outcome: Completed/Met ?Goal: Cardiovascular complication will be avoided ?Outcome: Completed/Met ?  ?Problem: Activity: ?Goal: Risk for activity intolerance will decrease ?Outcome: Completed/Met ?  ?Problem: Nutrition: ?Goal: Adequate nutrition will be maintained ?Outcome: Completed/Met ?  ?Problem: Coping: ?Goal: Level of anxiety will decrease ?Outcome: Completed/Met ?  ?Problem: Elimination: ?Goal: Will not experience complications related to bowel motility ?Outcome: Completed/Met ?Goal: Will not experience complications related to urinary retention ?Outcome: Completed/Met ?  ?Problem: Pain Managment: ?Goal: General experience of comfort will improve ?Outcome: Completed/Met ?  ?Problem: Safety: ?Goal: Ability to remain free from injury will improve ?Outcome: Completed/Met ?  ?Problem: Skin Integrity: ?Goal: Risk for impaired skin integrity will decrease ?Outcome: Completed/Met ?  ?

## 2021-08-24 NOTE — Progress Notes (Signed)
Patient complaint of headaches rated 10/10 that woke her from sleep.  She described as sharp achy and pounding.  No PRN order is on file.  Dr. Vanessa [place] text paged.  Awaiting response. ?

## 2021-08-24 NOTE — Progress Notes (Signed)
[facility] ?Progress Note ? ? ?Subjective: Pt seen in room. Had HD yesterday w/ net UF 2.8 L. On room air today, looks much better. Standing wt was 42.6 kg. SOB resolved, breathing back to baseline.  ? ?Vitals:  ? 08/23/21 2038 08/23/21 2340 08/24/21 0308 08/24/21 0428  ?BP: (!) 174/120 (!) 174/120 (!) 178/123 (!) 168/100  ?Pulse: 91 95 86 90  ?Resp: '20 20 20 20  '$ ?Temp: 98.7 ?F (37.1 ?C) 97.8 ?F (36.6 ?C) 97.8 ?F (36.6 ?C)   ?TempSrc: Oral Oral Oral   ?SpO2: 98% 97% 99% 99%  ?Weight:      ?Height:      ? ? ?Exam: ?General: Ill-appearing; NAD ?Heart: S1 and S2; No murmurs, gallops, or rubs ?Lungs: Clear throughout ?Abdomen: Soft and non-tender ?Extremities: No edema ?Dialysis Access: R TDC  ? ? ?   Home meds: norvasc 10, hydralazine 50 tid, losartan 100, remeron, lyrica, crestor, renvela 3 ac tid, velphoro 1 gm tid ac, warfarin, prns/ vits/ supps ? ? ? ? OP HD: [place] ?  3h 70mn   43.5kg  400/1.5  2/2 bath  P2 Hep none  RIJ TDC ? - refuses perm access ? - mircera 150 ug q2, last 3/23, due 4/6 ? - hectorol 4 ug tiw IV ? ?   CXR 3/25 - IMPRESSION: Cardiomegaly with vascular congestion, progressed since the prior radiograph. No focal consolidation. ? ? ? ?Assessment/ Plan: ?SOB/ chest pain - CXR + for pulm edema. HD yest w/ 2.8 L off.  ?ESRD -on HD TTS. Had HD here yesterday w/ 2.8 L off. Standing wt this am is 42.6kg. Will set lower dry wt of 42kg at dc and titrate down from there as tolerated. Repeat CXR is clear. OK for dc.  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 Just got esa, next due on 4/6.  ?MBD ckd - CCa^ > hold vdra for now, add phos, cont renvela.  ?HTN/ vol - very high BP's in ED. Better today.  ?H/o aortic dissection ?COPD ?H/o CVA ? ? ? ? ? ?Rob SDoctor, hospital?08/24/2021, 6:55 AM ? ? ?Recent Labs  ?Lab 08/23/21 ?0125 08/24/21 ?06384 ?HGB 9.9*  --   ?ALBUMIN 3.4*  --   ?CALCIUM 9.9 9.5  ?PHOS  --  3.9  ?CREATININE 6.59* 3.97*  ?K 4.4 3.9  ? ? ?Inpatient medications: ? ? ? ? ? ? ? ? ? ?

## 2021-08-24 NOTE — Progress Notes (Signed)
Patient complaint of headache again stating that it worse now.  She is observed crying and very restless.  BP is elevated now in the 200s.  Dr. Vanessa [place] text paged and at bedside to evaluate patient.  An order for 10 mg labetalol received. He authorized RN to give another dose of tylenol.  Patient is restless in bed.  Will continue to monitor. ?

## 2021-08-24 NOTE — Progress Notes (Signed)
Received a call from Dr. Vanessa [place] in response to text page.  An order for tylenol received.  Dr. Vanessa [place] at bedside evaluating patient.  Tylenol given per order.  Cold compress applied to forehead.  Will continue to monitor. ?

## 2021-08-24 NOTE — Discharge Summary (Signed)
Family Medicine Teaching Service ?Hospital Discharge Summary ? ?Patient name: Monique Henderson Medical record number: [id number] ?Date of birth: 10/23/1950 Age: 71 y.o. Gender: female ?Date of Admission: 08/23/2021  Date of Discharge: 08/24/21 ?Admitting Physician: Wells Guiles, DO ? ?Primary Care Provider: Myrtie Neither, PA-C ?Consultants: Nephrology  ? ?Indication for Hospitalization: Severe hypertension  ? ?Discharge Diagnoses/Problem List:  ?Severe hypertension  ?ESRD on HD  ? ?Disposition: Home ? ?Discharge Condition: Stable  ? ?Discharge Exam:  ? ?Temp:  [97.2 ?F (36.2 ?C)-98.7 ?F (37.1 ?C)] 98.3 ?F (36.8 ?C) (03/26 0754) ?Pulse Rate:  [58-115] 88 (03/26 0754) ?Resp:  [18-40] 18 (03/26 0754) ?BP: (141-223)/(87-142) 191/103 (03/26 0754) ?SpO2:  [90 %-100 %] 99 % (03/26 0754) ?Weight:  [48.1 kg] 48.1 kg (03/25 1715) ? ?Physical Exam: ?General: alert, sitting in bed, NAD ?Cardiovascular: RRR no murmurs  ?Respiratory: CTAB normal WOB  ?Abdomen: soft, non tender  ?Extremities: warm dry. No LE edema  ? ?Brief Hospital Course:  ? ?Monique Henderson is a 71 y.o.female with a history of ESRD on HD, HTN, GERD, tobacco use disorder, aortic dissection status post aortic aneurysm repair, paroxysmal A-fib with RVR, medication noncompliance who was admitted to the family practice teaching Service at [facility] for severe hypertension. Her hospital course is detailed below: ? ?Severe Hypertension  ?Presented with palpable chest pain and blood pressures in the 210s/110s.  Troponins elevated to 166> 175 which is at baseline from prior admission.  Received clonidine 0.2 mg and IV hydralazine 20 mg in the ED. CXR showed cardiomegaly with vascular congestion, progressed since the prior radiograph. No focal consolidation.  EKG NSR without ST elevation. CT head without acute intracranial abnormalities, but with moderate chronic microvascular ischemic changes that were stable from prior CT. On exam chest pain worsened with palpation  believed to be costochondritis. She also endorsed headaches, similar to last admission 2 weeks ago, without neurological deficits, change in vision. Patient was stable at time of discharge and was restarted on home medications in addition to Coreg 3.124m BID and was advised the importance of taking her medications, refraining from cocaine use and follow up with PCP.  ? ?Other chronic conditions were medically managed with home medications and formulary alternatives as necessary (COPD, HLD, PAF, ESRD on HD) ? ?Issues for Follow Up:  ?Ensure patient is adherent to her medications.  Coreg 3.125 twice daily was added to her regimen ?Ensure patient refrains from cocaine use  ? ?Significant Procedures: None  ? ?Significant Labs and Imaging:  ?Recent Labs  ?Lab 08/23/21 ?0125  ?WBC 6.0  ?HGB 9.9*  ?HCT 31.4*  ?PLT 255  ? ?Recent Labs  ?Lab 08/23/21 ?0125 08/24/21 ?01601 ?NA 141 137  ?K 4.4 3.9  ?CL 101 99  ?CO2 28 28  ?GLUCOSE 114* 106*  ?BUN 31* 18  ?CREATININE 6.59* 3.97*  ?CALCIUM 9.9 9.5  ?PHOS  --  3.9  ?ALKPHOS 137*  --   ?AST 20  --   ?ALT 29  --   ?ALBUMIN 3.4*  --   ? ? ? ?Results/Tests Pending at Time of Discharge: None  ? ?Discharge Medications:  ?Allergies as of 08/24/2021   ? ?   Reactions  ? Shrimp [shellfish Allergy] Shortness Of Breath  ? Bactroban [mupirocin] Other (See Comments)  ? "Sores in nose"  ? Vicodin [hydrocodone-acetaminophen] Itching, Nausea And Vomiting  ? This is patient's home medication  ? Eliquis [apixaban] Itching  ? Spoke with patient, no rash.  Has tolerated on re-challenge.  ?  Lisinopril Cough  ? Tylenol [acetaminophen] Itching  ? ?  ? ?  ?Medication List  ?  ? ?STOP taking these medications   ? ?warfarin 6 MG tablet ?Commonly known as: COUMADIN ?  ? ?  ? ?TAKE these medications   ? ?albuterol 108 (90 Base) MCG/ACT inhaler ?Commonly known as: VENTOLIN HFA ?Inhale 2 puffs into the lungs every 6 (six) hours as needed for wheezing or shortness of breath. ?  ?amLODipine 10 MG  tablet ?Commonly known as: NORVASC ?Take 1 tablet (10 mg total) by mouth daily. ?  ?apixaban 2.5 MG Tabs tablet ?Commonly known as: ELIQUIS ?Take 1 tablet (2.5 mg total) by mouth 2 (two) times daily. ?  ?carvedilol 3.125 MG tablet ?Commonly known as: COREG ?Take 1 tablet (3.125 mg total) by mouth 2 (two) times daily with a meal. ?  ?cloNIDine 0.1 mg/24hr patch ?Commonly known as: CATAPRES - Dosed in mg/24 hr ?Place 1 patch (0.1 mg total) onto the skin every Saturday. ?  ?hydrALAZINE 50 MG tablet ?Commonly known as: APRESOLINE ?Take 1 tablet (50 mg total) by mouth 3 (three) times daily. ?  ?isosorbide mononitrate 30 MG 24 hr tablet ?Commonly known as: IMDUR ?Take 1 tablet (30 mg total) by mouth daily. ?  ?loperamide 2 MG capsule ?Commonly known as: IMODIUM ?Take 2 mg by mouth as needed for diarrhea or loose stools. ?  ?losartan 100 MG tablet ?Commonly known as: COZAAR ?Take 1 tablet (100 mg total) by mouth daily. ?  ?mirtazapine 7.5 MG tablet ?Commonly known as: REMERON ?Take 1 tablet (7.5 mg total) by mouth at bedtime. ?What changed:  ?when to take this ?reasons to take this ?  ?Narcan 4 MG/0.1ML Liqd nasal spray kit ?Generic drug: naloxone ?Place 1 spray into the nose as needed for opioid reversal. If no response within 3 minutes then repeat once in the other nostril.  Call 911. ?  ?pregabalin 50 MG capsule ?Commonly known as: LYRICA ?Take 100 mg by mouth at bedtime. ?  ?rosuvastatin 10 MG tablet ?Commonly known as: CRESTOR ?Take 1 tablet (10 mg total) by mouth daily. ?  ?sevelamer carbonate 2.4 g Pack ?Commonly known as: RENVELA ?Take 2.4 g by mouth 3 (three) times daily with meals. ?  ?Velphoro 500 MG chewable tablet ?Generic drug: sucroferric oxyhydroxide ?Chew 1,000 mg by mouth 3 (three) times daily. ?  ?VITAMIN D PO ?Take 5,000 Units by mouth daily. ?  ? ?  ? ? ?Discharge Instructions: Please refer to Patient Instructions section of EMR for full details.  Patient was counseled important signs and symptoms that  should prompt return to medical care, changes in medications, dietary instructions, activity restrictions, and follow up appointments.  ? ?Follow-Up Appointments: ? ? ?Shary Key, DO ?08/24/2021, 3:22 PM ?PGY-2, [place]  ?

## 2021-08-24 NOTE — Evaluation (Signed)
Occupational Therapy Evaluation ?Patient Details ?Name: Monique Henderson ?MRN: [id number] ?DOB: 11-04-1950 ?Today's Date: 08/24/2021 ? ? ?History of Present Illness Monique Henderson is a 71 year old female who presented with chest pain. 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  ? ?Clinical Impression ?  ?Monique Henderson was evaluated s/p the above admission list, she reports being generally mod I PTA. Upon arrival pt endorsed a headache that progressed to 10/10 pain after minimal movement at bed level. Pt did not require physical assist for bed mobility, and is generally min guard for ADLs. Pt will benefit from continued OT acutely to further assess cognition and activity tolerance. Recommend d/c to home with [place].  ?   ? ?Recommendations for follow up therapy are one component of a multi-disciplinary discharge planning process, led by the attending physician.  Recommendations may be updated based on patient status, additional functional criteria and insurance authorization.  ? ?Follow Up Recommendations ? Home health OT  ?  ?Assistance Recommended at Discharge Frequent or constant Supervision/Assistance  ?Patient can return home with the following A little help with walking and/or transfers;A little help with bathing/dressing/bathroom;Direct supervision/assist for medications management;Assist for transportation;Help with stairs or ramp for entrance;Direct supervision/assist for financial management ? ?  ?Functional Status Assessment ? Patient has had a recent decline in their functional status and demonstrates the ability to make significant improvements in function in a reasonable and predictable amount of time.  ?Equipment Recommendations ? None recommended by OT  ?  ?   ?Precautions / Restrictions Precautions ?Precautions: Fall ?Restrictions ?Weight Bearing Restrictions:  No  ? ?  ? ?Mobility Bed Mobility ?Overal bed mobility: Needs Assistance ?Bed Mobility: Supine to Sit, Sit to Supine ?  ?  ?Supine to sit: Supervision ?Sit to supine: Supervision ?  ?General bed mobility comments: incr time ?  ? ?Transfers ?Overall transfer level: Needs assistance (pt declined.) ?  ?  ?  ?  ? ?  ?Balance Overall balance assessment: Needs assistance ?Sitting-balance support: Feet supported ?Sitting balance-Leahy Scale: Good ?  ?  ?  ?  ?  ?  ?   ? ?ADL either performed or assessed with clinical judgement  ? ?ADL Overall ADL's : Needs assistance/impaired ?Eating/Feeding: Independent;Sitting ?  ?Grooming: Supervision/safety;Standing ?  ?Upper Body Bathing: Set up;Sitting ?  ?Lower Body Bathing: Min guard;Sit to/from stand ?  ?Upper Body Dressing : Set up;Sitting ?  ?Lower Body Dressing: Min guard;Sit to/from stand ?  ?Toilet Transfer: Min guard;Ambulation ?  ?Toileting- Clothing Manipulation and Hygiene: Supervision/safety;Sitting/lateral lean ?  ?  ?  ?Functional mobility during ADLs: Min guard ?General ADL Comments: pt reporting that she has been going to the sink and bathroom indep acutely  ? ? ? ?Vision Baseline Vision/History: 1 Wears glasses ?Ability to See in Adequate Light: 0 Adequate ?Patient Visual Report: No change from baseline ?Vision Assessment?: No apparent visual deficits  ?   ?   ?   ? ?Pertinent Vitals/Pain Pain Assessment ?Pain Assessment: 0-10 ?Pain Score: 10-Worst pain ever ?Faces Pain Scale: Hurts even more ?Pain Location: head ?Pain Descriptors / Indicators: Headache ?Pain Intervention(s): Monitored during session, Limited activity within patient's tolerance  ? ? ? ?Hand Dominance Right ?  ?Extremity/Trunk Assessment Upper Extremity Assessment ?Upper Extremity Assessment: Generalized weakness;Overall [facility] for tasks assessed ?  ?Lower Extremity Assessment ?Lower Extremity Assessment: Defer to PT evaluation ?  ?Cervical /  Trunk Assessment ?Cervical / Trunk Assessment: Kyphotic ?   ?Communication Communication ?Communication: No difficulties ?  ?Cognition Arousal/Alertness: Awake/alert ?Behavior During Therapy: Flat affect ?Overall Cognitive Status: No family/caregiver present to determine baseline cognitive functioning ?  ?  ?  ?  ?  ?  ?  ?  ?  ?  ?  ?  ?  ?  ?  ?  ?General Comments: complaints of headache upon arrival, initially agreeable but couldnt encourage pt to prgoress past EOB. pt required increased time for processing throughout, slow to initiate. would benefit from further assesesment for higher level tasks ?  ?  ?General Comments  VSS on RA, pt with headache throughout ? ?  ? ?Home Living Family/patient expects to be discharged to:: Private residence ?Living Arrangements: Spouse/significant other ?Available Help at Discharge: Family;Available PRN/intermittently (fiance 24/7) ?Type of Home: Apartment ?Home Access: Level entry ?  ?  ?Home Layout: Two level ?Alternate Level Stairs-Number of Steps: stair lift up to bedroom ?  ?Bathroom Shower/Tub: Tub/shower unit ?  ?Bathroom Toilet: Standard ?  ?  ?Home Equipment: Conservation officer, nature (2 wheels);Cane - single point;BSC/3in1 ?  ?Additional Comments: information gleaned from 06/2021 admission as patient too lethargic to provide information ?  ? ?  ?Prior Functioning/Environment Prior Level of Function : Independent/Modified Independent ?  ?  ?  ?  ?  ?  ?  ?ADLs Comments: reports indep including driving, medication and money ?  ? ?  ?  ?OT Problem List: Decreased strength;Decreased activity tolerance;Impaired balance (sitting and/or standing);Decreased cognition;Decreased safety awareness;Pain ?  ?   ?OT Treatment/Interventions: Self-care/ADL training;Therapeutic exercise;DME and/or AE instruction;Patient/family education;Balance training  ?  ?OT Goals(Current goals can be found in the care plan section) Acute Rehab OT Goals ?Patient Stated Goal: less pain ?OT Goal Formulation: With patient ?Time For Goal Achievement: 09/07/21 ?Potential to  Achieve Goals: Good ?ADL Goals ?Pt Will Perform Grooming: Independently;standing ?Pt Will Transfer to Toilet: with modified independence;ambulating ?Additional ADL Goal #1: pt will indep carry out a 3 step directional task as a precursor to IADLs ?Additional ADL Goal #2: pt will demonstrated improved activity tolerance to complete at least 3 ADLs in standing with mod I  ?OT Frequency: Min 2X/week ?  ? ?   ?AM-PAC OT "6 Clicks" Daily Activity     ?Outcome Measure Help from another person eating meals?: None ?Help from another person taking care of personal grooming?: A Little ?Help from another person toileting, which includes using toliet, bedpan, or urinal?: A Little ?Help from another person bathing (including washing, rinsing, drying)?: A Little ?Help from another person to put on and taking off regular upper body clothing?: None ?Help from another person to put on and taking off regular lower body clothing?: A Little ?6 Click Score: 20 ?  ?End of Session Nurse Communication: Mobility status ? ?Activity Tolerance: Patient tolerated treatment well;Patient limited by pain ?Patient left: in bed;with call bell/phone within reach;with bed alarm set ? ?OT Visit Diagnosis: Unsteadiness on feet (R26.81);Other abnormalities of gait and mobility (R26.89);Muscle weakness (generalized) (M62.81);Other symptoms and signs involving cognitive function;Dizziness and giddiness (R42);Adult, failure to thrive (R62.7);Pain  ?              ?Time: 6606-3016 ?OT Time Calculation (min): 12 min ?Charges:  OT General Charges ?$OT Visit: 1 Visit ?OT Evaluation ?$OT Eval Moderate Complexity: 1 Mod ? ? ?Raysa Bosak A Nana Hoselton ?08/24/2021, 2:08 PM ?

## 2021-08-24 NOTE — Discharge Instructions (Addendum)
Dear Alcus Dad,  ? ?Thank you so much for allowing [place] to be part of your care!  You were admitted to [facility] for severe hypertension.  We are glad you are feeling better! It will be very important to take all of your medications as we discussed, and refrain from cocaine use. ? ? ?POST-HOSPITAL & CARE INSTRUCTIONS ?Please let PCP/Specialists know of any changes that were made.  ?Please see medications section of this packet for any medication changes.  ? ?DOCTOR'S APPOINTMENT & FOLLOW UP CARE INSTRUCTIONS  ?No future appointments. ? ? ?Take care and be well! ? ?Family Medicine Teaching Service Inpatient Team ?[place]  ?[facility]  ?[address] [place], [place] [address] ?[phone]  ?

## 2021-08-24 NOTE — Progress Notes (Signed)
Pt discharged home in stable condition accompanied by friend. ?

## 2021-08-24 NOTE — Evaluation (Incomplete)
Occupational Therapy Evaluation ?Patient Details ?Name: Monique Henderson ?MRN: [id number] ?DOB: 21-Sep-1950 ?Today's Date: 08/24/2021 ? ? ?History of Present Illness    ? ?Clinical Impression ?  ?***  ?   ? ?Recommendations for follow up therapy are one component of a multi-disciplinary discharge planning process, led by the attending physician.  Recommendations may be updated based on patient status, additional functional criteria and insurance authorization.  ? ?Follow Up Recommendations ? Home health OT  ?  ?Assistance Recommended at Discharge Frequent or constant Supervision/Assistance  ?Patient can return home with the following A little help with walking and/or transfers;A little help with bathing/dressing/bathroom;Direct supervision/assist for medications management;Assist for transportation;Help with stairs or ramp for entrance;Direct supervision/assist for financial management ? ?  ?Functional Status Assessment ? Patient has had a recent decline in their functional status and demonstrates the ability to make significant improvements in function in a reasonable and predictable amount of time.  ?Equipment Recommendations ? None recommended by OT  ?  ?Recommendations for Other Services   ? ? ?  ?Precautions / Restrictions Restrictions ?Weight Bearing Restrictions: No  ? ?  ? ?Mobility Bed Mobility ?Overal bed mobility: Needs Assistance ?Bed Mobility: Supine to Sit, Sit to Supine ?  ?  ?Supine to sit: Supervision ?Sit to supine: Supervision ?  ?General bed mobility comments: incr time ?  ? ?Transfers ?Overall transfer level: Needs assistance (pt declined.) ?  ?  ?  ?  ?  ?  ?  ?  ?  ?  ? ?  ?Balance Overall balance assessment: Needs assistance ?Sitting-balance support: Feet supported ?Sitting balance-Leahy Scale: Good ?  ?  ?  ?  ?  ?  ?  ?  ?  ?  ?  ?  ?  ?  ?  ?  ?   ? ?ADL either performed or assessed with clinical judgement  ? ?ADL Overall ADL's : Needs assistance/impaired ?Eating/Feeding:  Independent;Sitting ?  ?Grooming: Supervision/safety;Standing ?  ?Upper Body Bathing: Set up;Sitting ?  ?Lower Body Bathing: Min guard;Sit to/from stand ?  ?Upper Body Dressing : Set up;Sitting ?  ?Lower Body Dressing: Min guard;Sit to/from stand ?  ?Toilet Transfer: Min guard;Ambulation ?  ?Toileting- Clothing Manipulation and Hygiene: Supervision/safety;Sitting/lateral lean ?  ?  ?  ?Functional mobility during ADLs: Min guard ?General ADL Comments: pt reporting that she has been going to the sink and bathroom indep acutely  ? ? ? ?Vision Baseline Vision/History: 1 Wears glasses ?Ability to See in Adequate Light: 0 Adequate ?Patient Visual Report: No change from baseline ?Vision Assessment?: No apparent visual deficits  ?   ?Perception   ?  ?Praxis   ?  ? ?Pertinent Vitals/Pain Pain Assessment ?Pain Assessment: 0-10 ?Pain Score: 10-Worst pain ever ?Faces Pain Scale: Hurts even more ?Pain Location: head ?Pain Descriptors / Indicators: Headache ?Pain Intervention(s): Monitored during session, Limited activity within patient's tolerance  ? ? ? ?Hand Dominance Right ?  ?Extremity/Trunk Assessment Upper Extremity Assessment ?Upper Extremity Assessment: Generalized weakness;Overall [facility] for tasks assessed ?  ?Lower Extremity Assessment ?Lower Extremity Assessment: Defer to PT evaluation ?  ?Cervical / Trunk Assessment ?Cervical / Trunk Assessment: Kyphotic ?  ?Communication Communication ?Communication: No difficulties ?  ?Cognition Arousal/Alertness: Awake/alert ?Behavior During Therapy: Flat affect ?Overall Cognitive Status: No family/caregiver present to determine baseline cognitive functioning ?  ?  ?  ?  ?  ?  ?  ?  ?  ?  ?  ?  ?  ?  ?  ?  ?  General Comments: complaints of headache upon arrival, initially agreeable but couldnt encourage pt to prgoress past EOB. pt required increased time for processing throughout, slow to initiate. would benefit from further assesesment for higher level tasks ?  ?  ?General Comments   VSS on RA, pt with headache throughout ? ?  ?Exercises   ?  ?Shoulder Instructions    ? ? ?Home Living Family/patient expects to be discharged to:: Private residence ?Living Arrangements: Spouse/significant other ?Available Help at Discharge: Family;Available PRN/intermittently (fiance 24/7) ?Type of Home: Apartment ?Home Access: Level entry ?  ?  ?Home Layout: Two level ?Alternate Level Stairs-Number of Steps: stair lift up to bedroom ?  ?Bathroom Shower/Tub: Tub/shower unit ?  ?Bathroom Toilet: Standard ?  ?  ?Home Equipment: Conservation officer, nature (2 wheels);Cane - single point;BSC/3in1 ?  ?Additional Comments: information gleaned from 06/2021 admission as patient too lethargic to provide information ?  ? ?  ?Prior Functioning/Environment Prior Level of Function : Independent/Modified Independent ?  ?  ?  ?  ?  ?  ?  ?ADLs Comments: reports indep including driving, medication and money ?  ? ?  ?  ?OT Problem List: Decreased strength;Decreased activity tolerance;Impaired balance (sitting and/or standing);Decreased cognition;Decreased safety awareness;Pain ?  ?   ?OT Treatment/Interventions: Self-care/ADL training;Therapeutic exercise;DME and/or AE instruction;Patient/family education;Balance training  ?  ?OT Goals(Current goals can be found in the care plan section) Acute Rehab OT Goals ?Patient Stated Goal: less pain ?OT Goal Formulation: With patient ?Time For Goal Achievement: 09/07/21 ?Potential to Achieve Goals: Good ?ADL Goals ?Pt Will Perform Grooming: Independently;standing ?Pt Will Transfer to Toilet: with modified independence;ambulating ?Additional ADL Goal #1: pt will indep carry out a 3 step directional task as a precursor to IADLs ?Additional ADL Goal #2: pt will demonstrated improved activity tolerance to complete at least 3 ADLs in standing with mod I  ?OT Frequency: Min 2X/week ?  ? ?Co-evaluation   ?  ?  ?  ?  ? ?  ?AM-PAC OT "6 Clicks" Daily Activity     ?Outcome Measure Help from another person eating  meals?: None ?Help from another person taking care of personal grooming?: A Little ?Help from another person toileting, which includes using toliet, bedpan, or urinal?: A Little ?Help from another person bathing (including washing, rinsing, drying)?: A Little ?Help from another person to put on and taking off regular upper body clothing?: None ?Help from another person to put on and taking off regular lower body clothing?: A Little ?6 Click Score: 20 ?  ?End of Session Nurse Communication: Mobility status ? ?Activity Tolerance: Patient tolerated treatment well;Patient limited by pain ?Patient left: in bed;with call bell/phone within reach;with bed alarm set ? ?OT Visit Diagnosis: Unsteadiness on feet (R26.81);Other abnormalities of gait and mobility (R26.89);Muscle weakness (generalized) (M62.81);Other symptoms and signs involving cognitive function;Dizziness and giddiness (R42);Adult, failure to thrive (R62.7);Pain  ?              ?Time: 9628-3662 ?OT Time Calculation (min): 12 min ?Charges:  OT General Charges ?$OT Visit: 1 Visit ?OT Evaluation ?$OT Eval Moderate Complexity: 1 Mod ? ?{enter signature dotphrase here} ? ?Sender Rueb A Joanthan Hlavacek ?08/24/2021, 1:18 PM ?

## 2021-08-24 NOTE — Progress Notes (Signed)
During bedside report patient complained of severe head pain while holding her forehead. Morning BP is 190s/103. [facility] HTS was secured chatted. At [place], DO noted a one time dose of '10mg'$  labetolol. Currently no active BP PRN medications at this time. Awaiting response.  ?

## 2021-08-24 NOTE — Progress Notes (Addendum)
Patient still has complaints of headache and blood pressures remain elevated.  Went to evaluate the patient.  She was awake and alert to person, place, time.  She expressed that her headache felt worse than before.  She denied any chest pain, shortness of breath, confusion.  She had a normal neurologic exam.  At times she was crying out about her pain and other times she was speaking calmly.   ? ?Blood pressure was elevated in the 185U systolically.  There was concern that she had used cocaine recently and I discussed this with her.  She states that she has not used any that she knows of recently but used some fentanyl yesterday which may have had some mixed with it.  I discussed with pharmacy about additional options for blood pressure and pharmacy did recommend that labetalol could still be a viable option even with recent cocaine usage.  Pharmacy recommended 10 mg every 4 hours.  I did put an order for 10 mg labetalol which was given in the room.  Patient did request Aleeve while I was in the room. I asked her if she normally takes this at home and that this is strongly recommended against for patients with CKD or on HD.  She states that she does normally take them at home when she gets headaches like this.  I recommended against this in the future.  We will give a 5 mg dose of oxycodone noting that she does have CKD on HD, to assist with the Tylenol with her headache. I explained to her that we will see if her headache improves in the next 15 or 20 minutes and if it worsens, if she develops any confusion, chest pain, shortness of breath, or other symptoms to immediately let me know.  Do not think we need to reimage her head at this time given normal neurologic exam and no additional symptoms.   ?

## 2021-08-24 NOTE — Progress Notes (Signed)
Received page that patient was having a headache.  Went to evaluate the patient.  Patient did endorse having a headache.  She did not have any other complaints.  Noted her blood pressure remains elevated.  I did review her chart and noted that she had a previously listed allergy to acetaminophen from 2014.  Additionally noted that she had received it multiple times since then. ? ?On physical exam she is alert and oriented to person, place, time, situation.  She has a nonfocal neurologic exam.  ? ?Placed order for acetaminophen as needed for headaches.  Reviewed her blood pressure medications.  At this time will not make changes to these unless her blood pressures become more elevated or her headache does not subside with the acetaminophen.  There is a thought that she may have not been taking her blood pressure medications at home and so these blood pressure should improve over the next hours from restarting these meds.  If she was not taking her Imdur at home and this was restarted here that could be the cause of her headaches.  She is at high dose of most of her BP medications and has a contraindication to beta-blockers with a history of cocaine use.  We will continue to monitor. ?

## 2021-08-30 DIAGNOSIS — N2581 Secondary hyperparathyroidism of renal origin: Secondary | ICD-10-CM | POA: Diagnosis not present

## 2021-08-30 DIAGNOSIS — N186 End stage renal disease: Secondary | ICD-10-CM | POA: Diagnosis not present

## 2021-08-30 DIAGNOSIS — Z992 Dependence on renal dialysis: Secondary | ICD-10-CM | POA: Diagnosis not present

## 2021-09-02 DIAGNOSIS — N186 End stage renal disease: Secondary | ICD-10-CM | POA: Diagnosis not present

## 2021-09-02 DIAGNOSIS — N2581 Secondary hyperparathyroidism of renal origin: Secondary | ICD-10-CM | POA: Diagnosis not present

## 2021-09-02 DIAGNOSIS — Z992 Dependence on renal dialysis: Secondary | ICD-10-CM | POA: Diagnosis not present

## 2021-09-07 ENCOUNTER — Emergency Department (HOSPITAL_COMMUNITY): Payer: Medicare HMO

## 2021-09-07 ENCOUNTER — Emergency Department (HOSPITAL_COMMUNITY)
Admission: EM | Admit: 2021-09-07 | Discharge: 2021-09-08 | Disposition: A | Discharge status: Home / Self Care | Payer: Medicare HMO | Attending: Emergency Medicine | Admitting: Emergency Medicine

## 2021-09-07 ENCOUNTER — Other Ambulatory Visit: Payer: Self-pay

## 2021-09-07 ENCOUNTER — Encounter (HOSPITAL_COMMUNITY): Payer: Self-pay | Admitting: Emergency Medicine

## 2021-09-07 DIAGNOSIS — I4510 Unspecified right bundle-branch block: Secondary | ICD-10-CM | POA: Diagnosis not present

## 2021-09-07 DIAGNOSIS — I161 Hypertensive emergency: Secondary | ICD-10-CM | POA: Insufficient documentation

## 2021-09-07 DIAGNOSIS — R0602 Shortness of breath: Secondary | ICD-10-CM | POA: Diagnosis not present

## 2021-09-07 DIAGNOSIS — R778 Other specified abnormalities of plasma proteins: Secondary | ICD-10-CM | POA: Diagnosis not present

## 2021-09-07 DIAGNOSIS — Z79899 Other long term (current) drug therapy: Secondary | ICD-10-CM | POA: Diagnosis not present

## 2021-09-07 DIAGNOSIS — Z7901 Long term (current) use of anticoagulants: Secondary | ICD-10-CM | POA: Diagnosis not present

## 2021-09-07 DIAGNOSIS — N186 End stage renal disease: Secondary | ICD-10-CM | POA: Insufficient documentation

## 2021-09-07 DIAGNOSIS — Z743 Need for continuous supervision: Secondary | ICD-10-CM | POA: Diagnosis not present

## 2021-09-07 DIAGNOSIS — Z992 Dependence on renal dialysis: Secondary | ICD-10-CM | POA: Diagnosis not present

## 2021-09-07 DIAGNOSIS — R6889 Other general symptoms and signs: Secondary | ICD-10-CM | POA: Diagnosis not present

## 2021-09-07 LAB — CBC WITH DIFFERENTIAL/PLATELET
Abs Immature Granulocytes: 0.01 10*3/uL (ref 0.00–0.07)
Basophils Absolute: 0.1 10*3/uL (ref 0.0–0.1)
Basophils Relative: 2 %
Eosinophils Absolute: 0.1 10*3/uL (ref 0.0–0.5)
Eosinophils Relative: 3 %
HCT: 32.4 % — ABNORMAL LOW (ref 36.0–46.0)
Hemoglobin: 9.9 g/dL — ABNORMAL LOW (ref 12.0–15.0)
Immature Granulocytes: 0 %
Lymphocytes Relative: 15 %
Lymphs Abs: 0.7 10*3/uL (ref 0.7–4.0)
MCH: 28.4 pg (ref 26.0–34.0)
MCHC: 30.6 g/dL (ref 30.0–36.0)
MCV: 92.8 fL (ref 80.0–100.0)
Monocytes Absolute: 0.5 10*3/uL (ref 0.1–1.0)
Monocytes Relative: 12 %
Neutro Abs: 3.2 10*3/uL (ref 1.7–7.7)
Neutrophils Relative %: 68 %
Platelets: 387 10*3/uL (ref 150–400)
RBC: 3.49 MIL/uL — ABNORMAL LOW (ref 3.87–5.11)
RDW: 17.7 % — ABNORMAL HIGH (ref 11.5–15.5)
WBC: 4.7 10*3/uL (ref 4.0–10.5)
nRBC: 0 % (ref 0.0–0.2)

## 2021-09-07 LAB — BASIC METABOLIC PANEL
Anion gap: 18 — ABNORMAL HIGH (ref 5–15)
BUN: 80 mg/dL — ABNORMAL HIGH (ref 8–23)
CO2: 20 mmol/L — ABNORMAL LOW (ref 22–32)
Calcium: 9.6 mg/dL (ref 8.9–10.3)
Chloride: 99 mmol/L (ref 98–111)
Creatinine, Ser: 12.34 mg/dL — ABNORMAL HIGH (ref 0.44–1.00)
GFR, Estimated: 3 mL/min — ABNORMAL LOW (ref >60)
Glucose, Bld: 108 mg/dL — ABNORMAL HIGH (ref 70–99)
Potassium: 5.2 mmol/L — ABNORMAL HIGH (ref 3.5–5.1)
Sodium: 137 mmol/L (ref 135–145)

## 2021-09-07 LAB — TROPONIN I (HIGH SENSITIVITY)
Troponin I (High Sensitivity): 139 ng/L (ref <18)
Troponin I (High Sensitivity): 115 ng/L (ref <18)

## 2021-09-07 MED ORDER — LOSARTAN POTASSIUM 50 MG PO TABS
100.0000 mg | ORAL_TABLET | Freq: Every day | ORAL | Status: DC
  Filled 2021-09-07: qty 2

## 2021-09-07 MED ORDER — AMLODIPINE BESYLATE 5 MG PO TABS
10.0000 mg | ORAL_TABLET | Freq: Once | ORAL | Status: DC
  Filled 2021-09-07: qty 2

## 2021-09-07 MED ORDER — CHLORHEXIDINE GLUCONATE CLOTH 2 % EX PADS: 6.0000 | MEDICATED_PAD | Freq: Every day | CUTANEOUS | Status: DC

## 2021-09-07 MED ORDER — HYDRALAZINE HCL 25 MG PO TABS
50.0000 mg | ORAL_TABLET | Freq: Once | ORAL | Status: DC
  Filled 2021-09-07: qty 2

## 2021-09-07 MED ORDER — CLONIDINE HCL 0.1 MG PO TABS
0.1000 mg | ORAL_TABLET | Freq: Once | ORAL | Status: DC
Start: 2021-09-07 — End: 2021-09-07
  Filled 2021-09-07: qty 1

## 2021-09-07 MED ORDER — FENTANYL CITRATE PF 50 MCG/ML IJ SOSY
50.0000 ug | PREFILLED_SYRINGE | Freq: Once | INTRAMUSCULAR | Status: AC
  Administered 2021-09-07: 50 ug via INTRAVENOUS
  Filled 2021-09-07: qty 1

## 2021-09-07 MED ORDER — HYDRALAZINE HCL 20 MG/ML IJ SOLN
10.0000 mg | Freq: Once | INTRAMUSCULAR | Status: AC
  Administered 2021-09-07: 10 mg via INTRAVENOUS
  Filled 2021-09-07: qty 1

## 2021-09-07 MED ORDER — ACETAMINOPHEN 325 MG PO TABS
650.0000 mg | ORAL_TABLET | Freq: Once | ORAL | Status: AC | PRN
  Administered 2021-09-07: 650 mg via ORAL
  Filled 2021-09-07: qty 2

## 2021-09-07 MED ORDER — ONDANSETRON HCL 4 MG/2ML IJ SOLN: 4.0000 mg | Freq: Once | INTRAMUSCULAR | Status: DC

## 2021-09-07 NOTE — ED Notes (Signed)
Pt returned from bathroom and placed back on monitor. New warm blankets placed across her and call remote within reach.  ?

## 2021-09-07 NOTE — ED Notes (Signed)
Pt calling out and c/o headache at this time. ED provider made aware of pain complaint ?

## 2021-09-07 NOTE — ED Notes (Addendum)
Notified Dr. Melina Copa that patient's BP remains elevated at 182/108 after 10 mg of hydralazine. No interventions needed at this fine due to patient being asymptomatic.  ?

## 2021-09-07 NOTE — ED Notes (Signed)
Pt BP 190/106 notified Dr. Pearline Cables.  Dr. Pearline Cables to order home BP meds. ?

## 2021-09-07 NOTE — ED Notes (Signed)
Pt ambulated self to bathroom. Asked about delay in hemodialysis. This RN explained that hemodialysis floor has been made aware of pt's need for dialysis and that the plan is for her to go upstairs for this sometime this evening when HD floor is ready for her.  ?

## 2021-09-07 NOTE — ED Notes (Signed)
Critical Troponin of 139.  MD Pearline Cables notified. ?

## 2021-09-07 NOTE — ED Notes (Signed)
Notified Dr Pearline Cables that pt stated she "took her BP meds an hour ago".  Dr Pearline Cables verbalized discontinuing of home meds and start of drip for BP control. ?

## 2021-09-07 NOTE — ED Triage Notes (Signed)
Patient BIB [facility] from home, complaint of shortness of breath since Thursday. Pt reports missing last two dialysis appointments d/t not feeling well. Pt hypertensive with EMS. A&Ox4. ?

## 2021-09-07 NOTE — ED Provider Notes (Signed)
?[facility] ?Provider Note ? ? ?CSN: [id number] ?Arrival date & time: 09/07/21  1350 ? ?  ? ?History ? ?Chief Complaint  ?Patient presents with  ? Shortness of Breath  ? ? ?Monique Henderson is a 71 y.o. female. ? ?Patient is a 71 year old female with past medical history of end-stage renal disease on dialysis Tuesday, Thursday, Saturday presenting for complaints of shortness of breath.  Patient admits to missing dialysis on Thursday and Saturday due to difficulty getting to her dialysis location.  Admits to worsening shortness of breath, lower extremity swelling, and abdominal fullness.  Denies any chest pain.  Denies any fevers, chills, nausea, vomiting.  Blood pressure on arrival 208/113. ? ?The history is provided by the patient. No language interpreter was used.  ?Shortness of Breath ?Associated symptoms: no abdominal pain, no chest pain, no cough, no ear pain, no fever, no rash, no sore throat and no vomiting   ? ?  ? ?Home Medications ?Prior to Admission medications   ?Medication Sig Start Date End Date Taking? Authorizing Provider  ?albuterol (VENTOLIN HFA) 108 (90 Base) MCG/ACT inhaler Inhale 2 puffs into the lungs every 6 (six) hours as needed for wheezing or shortness of breath. 07/30/20   Maximiano Coss, NP  ?amLODipine (NORVASC) 10 MG tablet Take 1 tablet (10 mg total) by mouth daily. 02/14/21   Maximiano Coss, NP  ?apixaban (ELIQUIS) 2.5 MG TABS tablet Take 1 tablet (2.5 mg total) by mouth 2 (two) times daily. 08/24/21   Shary Key, DO  ?carvedilol (COREG) 3.125 MG tablet Take 1 tablet (3.125 mg total) by mouth 2 (two) times daily with a meal. 08/24/21   Paige, Weldon Picking, DO  ?cloNIDine (CATAPRES - DOSED IN MG/24 HR) 0.1 mg/24hr patch Place 1 patch (0.1 mg total) onto the skin every Saturday. 06/14/21   Caren Griffins, MD  ?hydrALAZINE (APRESOLINE) 50 MG tablet Take 1 tablet (50 mg total) by mouth 3 (three) times daily. 06/21/21 08/23/21  Nolberto Hanlon, MD   ?isosorbide mononitrate (IMDUR) 30 MG 24 hr tablet Take 1 tablet (30 mg total) by mouth daily. 08/17/21   Shary Key, DO  ?loperamide (IMODIUM) 2 MG capsule Take 2 mg by mouth as needed for diarrhea or loose stools. 07/15/20   [provider]  ?losartan (COZAAR) 100 MG tablet Take 1 tablet (100 mg total) by mouth daily. 02/14/21   Maximiano Coss, NP  ?mirtazapine (REMERON) 7.5 MG tablet Take 1 tablet (7.5 mg total) by mouth at bedtime. ?Patient taking differently: Take 7.5 mg by mouth at bedtime as needed (sleep). 07/15/20   Maximiano Coss, NP  ?NARCAN 4 MG/0.1ML LIQD nasal spray kit Place 1 spray into the nose as needed for opioid reversal. If no response within 3 minutes then repeat once in the other nostril.  Call 911. 05/15/20   [provider]  ?pregabalin (LYRICA) 50 MG capsule Take 100 mg by mouth at bedtime. 05/07/21   [provider]  ?rosuvastatin (CRESTOR) 10 MG tablet Take 1 tablet (10 mg total) by mouth daily. 02/14/21 08/23/21  Maximiano Coss, NP  ?sevelamer carbonate (RENVELA) 2.4 g PACK Take 2.4 g by mouth 3 (three) times daily with meals. 05/15/21   [provider]  ?VELPHORO 500 MG chewable tablet Chew 1,000 mg by mouth 3 (three) times daily. 05/19/21   [provider]  ?VITAMIN D PO Take 5,000 Units by mouth daily.    [provider]  ?   ? ?Allergies    ?  Shrimp [shellfish allergy], Bactroban [mupirocin], Vicodin [hydrocodone-acetaminophen], Eliquis [apixaban], Lisinopril, and Tylenol [acetaminophen]   ? ?Review of Systems   ?Review of Systems  ?Constitutional:  Negative for chills and fever.  ?HENT:  Negative for ear pain and sore throat.   ?Eyes:  Negative for pain and visual disturbance.  ?Respiratory:  Positive for shortness of breath. Negative for cough.   ?Cardiovascular:  Negative for chest pain and palpitations.  ?Gastrointestinal:  Negative for abdominal pain and vomiting.  ?Genitourinary:  Negative for dysuria and hematuria.   ?Musculoskeletal:  Negative for arthralgias and back pain.  ?Skin:  Negative for color change and rash.  ?Neurological:  Negative for seizures and syncope.  ?All other systems reviewed and are negative. ? ?Physical Exam ?Updated Vital Signs ?BP (!) 208/113   Pulse 92   Temp 98.1 ?F (36.7 ?C) (Oral)   Resp 16   Ht _0  (1.6 m)   Wt 42 kg   SpO2 91%   BMI 16.40 kg/m?  ?Physical Exam ?Vitals and nursing note reviewed.  ?Constitutional:   ?   General: She is not in acute distress. ?   Appearance: She is well-developed.  ?HENT:  ?   Head: Normocephalic and atraumatic.  ?Eyes:  ?   Conjunctiva/sclera: Conjunctivae normal.  ?Cardiovascular:  ?   Rate and Rhythm: Normal rate and regular rhythm.  ?   Heart sounds: No murmur heard. ?Pulmonary:  ?   Effort: Pulmonary effort is normal. No respiratory distress.  ?   Breath sounds: Normal breath sounds.  ?Abdominal:  ?   Palpations: Abdomen is soft.  ?   Tenderness: There is no abdominal tenderness.  ?Musculoskeletal:     ?   General: No swelling.  ?   Cervical back: Neck supple.  ?Skin: ?   General: Skin is warm and dry.  ?   Capillary Refill: Capillary refill takes less than 2 seconds.  ?Neurological:  ?   Mental Status: She is alert.  ?Psychiatric:     ?   Mood and Affect: Mood normal.  ? ? ?ED Results / Procedures / Treatments   ?Labs ?(all labs ordered are listed, but only abnormal results are displayed) ?Labs Reviewed  ?CBC WITH DIFFERENTIAL/PLATELET  ?BASIC METABOLIC PANEL  ?TROPONIN I (HIGH SENSITIVITY)  ? ? ?EKG ?None ? ?Radiology ?No results found. ? ?Procedures ?Procedures  ? ? ?Medications Ordered in ED ?Medications - No data to display ? ?ED Course/ Medical Decision Making/ A&P ?  ?                        ?Medical Decision Making ?Amount and/or Complexity of Data Reviewed ?Labs: ordered. ?Radiology: ordered. ? ?Risk ?OTC drugs. ?Prescription drug management. ? ? ?37:19 PM ?71 year old female with past medical history of end-stage renal disease on dialysis  Tuesday, Thursday, Saturday presenting for complaints of shortness of breath after missing dialysis x 2 days.  Patient is alert and oriented x3, no acute respiratory distress, afebrile, stable vital signs.  Oxygen 91% which is consistent with previous studies.  Pressure elevated at 208/113.  No crackles or rales on exam.  No extremity pitting edema.  No evidence of anasarca. ? ?Stable potassium at 5.2 ?Hydralazine given for elevated BP while awaiting dialysis ? ?Elevated troponin likely ischemic demand secondary to hypertensive emergency and need for dialysis. I spoke with nephrologist Dr. Jonnie Finner who will be arranging for dialysis this evening with the goal of discharging the patient from the ER  if improvement of symptoms.  ? ? ? ? ? ? ? ?Final Clinical Impression(s) / ED Diagnoses ?Final diagnoses:  ?Hypertensive emergency  ?Elevated troponin  ?End stage renal disease on dialysis [facility])  ? ? ?Rx / DC Orders ?ED Discharge Orders   ? ? None  ? ?  ? ? ?  ?Prusinski Stall P, DO ?03/70/48 1100 ? ?

## 2021-09-07 NOTE — Progress Notes (Signed)
Asked to see patient w/ ESRD, HTN, COPD, hx CVA for missed HD w/ SOB and IS pulm edema by CXR, not in distress. Pt seen in ED.  Pt is good candidate for "ED HD" which means take to HD and then return to ED for reassessment prior to dc home.   ? ?[place] ?  3h 30mn  400/1.5  2/2 bath  P2 Hep none  [facility] TDC ? ?RKelly Splinter MD ?09/07/2021, 3:28 PM ? ? ? ? ?

## 2021-09-07 NOTE — ED Provider Notes (Signed)
Signout from Dr. Pearline Cables.  71 year old female who is missed her last 2 dialysis treatments.  Here with shortness of breath and elevated blood pressure.  Renal aware of patient and plan for dialysis and reassessment.  Patient can likely be discharged if symptoms improved after dialysis. ?Physical Exam  ?BP (!) 193/114   Pulse 77   Temp 98.1 ?F (36.7 ?C) (Oral)   Resp 18   Ht '5\' 3"'$  (1.6 m)   Wt 42 kg   SpO2 93%   BMI 16.40 kg/m?  ? ?Physical Exam ? ?Procedures  ?Procedures ? ?ED Course / MDM  ?  ?Medical Decision Making ?Amount and/or Complexity of Data Reviewed ?Labs: ordered. ?Radiology: ordered. ? ?Risk ?OTC drugs. ?Prescription drug management. ? ?Patient went to dialysis towards the end of my shift and I have not seen her that time my finishing in the department. ? ? ? ? ? ?  ?Hayden Rasmussen, MD ?09/08/21 [phone] ? ?

## 2021-09-08 MED ORDER — HEPARIN SODIUM (PORCINE) 1000 UNIT/ML IJ SOLN
INTRAMUSCULAR | Status: AC
  Filled 2021-09-08: qty 4

## 2021-09-08 NOTE — ED Provider Notes (Signed)
Patient has returned from dialysis and states that she is feeling much better.  She is maintaining good oxygen saturation on room air and is felt to be stable for discharge.  She is encouraged to go to her scheduled dialysis sessions in the future. ?  ?Delora Fuel, MD ?54/62/70 0211 ? ?

## 2021-09-08 NOTE — Discharge Instructions (Signed)
Please make sure to go to all of your scheduled dialysis sessions. ?

## 2021-09-09 DIAGNOSIS — N186 End stage renal disease: Secondary | ICD-10-CM | POA: Diagnosis not present

## 2021-09-09 DIAGNOSIS — N2581 Secondary hyperparathyroidism of renal origin: Secondary | ICD-10-CM | POA: Diagnosis not present

## 2021-09-09 DIAGNOSIS — Z992 Dependence on renal dialysis: Secondary | ICD-10-CM | POA: Diagnosis not present

## 2021-09-11 DIAGNOSIS — N186 End stage renal disease: Secondary | ICD-10-CM | POA: Diagnosis not present

## 2021-09-11 DIAGNOSIS — N2581 Secondary hyperparathyroidism of renal origin: Secondary | ICD-10-CM | POA: Diagnosis not present

## 2021-09-11 DIAGNOSIS — Z992 Dependence on renal dialysis: Secondary | ICD-10-CM | POA: Diagnosis not present

## 2021-09-13 DIAGNOSIS — N186 End stage renal disease: Secondary | ICD-10-CM | POA: Diagnosis not present

## 2021-09-13 DIAGNOSIS — Z992 Dependence on renal dialysis: Secondary | ICD-10-CM | POA: Diagnosis not present

## 2021-09-13 DIAGNOSIS — N2581 Secondary hyperparathyroidism of renal origin: Secondary | ICD-10-CM | POA: Diagnosis not present

## 2021-09-14 ENCOUNTER — Encounter (HOSPITAL_COMMUNITY): Payer: Self-pay

## 2021-09-14 ENCOUNTER — Emergency Department (HOSPITAL_COMMUNITY): Payer: Medicare HMO

## 2021-09-14 ENCOUNTER — Observation Stay (HOSPITAL_COMMUNITY)
Admission: EM | Admit: 2021-09-14 | Discharge: 2021-09-15 | Disposition: A | Discharge status: Home Health | Payer: Medicare HMO | Attending: Family Medicine | Admitting: Family Medicine

## 2021-09-14 DIAGNOSIS — J9601 Acute respiratory failure with hypoxia: Secondary | ICD-10-CM

## 2021-09-14 DIAGNOSIS — I480 Paroxysmal atrial fibrillation: Secondary | ICD-10-CM | POA: Diagnosis not present

## 2021-09-14 DIAGNOSIS — N186 End stage renal disease: Secondary | ICD-10-CM | POA: Diagnosis not present

## 2021-09-14 DIAGNOSIS — J449 Chronic obstructive pulmonary disease, unspecified: Secondary | ICD-10-CM | POA: Insufficient documentation

## 2021-09-14 DIAGNOSIS — R0602 Shortness of breath: Secondary | ICD-10-CM | POA: Diagnosis not present

## 2021-09-14 DIAGNOSIS — R0600 Dyspnea, unspecified: Principal | ICD-10-CM | POA: Insufficient documentation

## 2021-09-14 DIAGNOSIS — Z992 Dependence on renal dialysis: Secondary | ICD-10-CM | POA: Insufficient documentation

## 2021-09-14 DIAGNOSIS — Z79899 Other long term (current) drug therapy: Secondary | ICD-10-CM | POA: Insufficient documentation

## 2021-09-14 DIAGNOSIS — Z8673 Personal history of transient ischemic attack (TIA), and cerebral infarction without residual deficits: Secondary | ICD-10-CM | POA: Insufficient documentation

## 2021-09-14 DIAGNOSIS — I5020 Unspecified systolic (congestive) heart failure: Secondary | ICD-10-CM | POA: Insufficient documentation

## 2021-09-14 DIAGNOSIS — D631 Anemia in chronic kidney disease: Secondary | ICD-10-CM | POA: Insufficient documentation

## 2021-09-14 DIAGNOSIS — I132 Hypertensive heart and chronic kidney disease with heart failure and with stage 5 chronic kidney disease, or end stage renal disease: Secondary | ICD-10-CM | POA: Insufficient documentation

## 2021-09-14 DIAGNOSIS — Z7901 Long term (current) use of anticoagulants: Secondary | ICD-10-CM | POA: Diagnosis not present

## 2021-09-14 DIAGNOSIS — Z87891 Personal history of nicotine dependence: Secondary | ICD-10-CM | POA: Diagnosis not present

## 2021-09-14 DIAGNOSIS — Z853 Personal history of malignant neoplasm of breast: Secondary | ICD-10-CM | POA: Insufficient documentation

## 2021-09-14 DIAGNOSIS — R0689 Other abnormalities of breathing: Secondary | ICD-10-CM | POA: Diagnosis not present

## 2021-09-14 DIAGNOSIS — R001 Bradycardia, unspecified: Secondary | ICD-10-CM | POA: Diagnosis not present

## 2021-09-14 DIAGNOSIS — I160 Hypertensive urgency: Secondary | ICD-10-CM | POA: Diagnosis not present

## 2021-09-14 DIAGNOSIS — Z20822 Contact with and (suspected) exposure to covid-19: Secondary | ICD-10-CM | POA: Diagnosis not present

## 2021-09-14 DIAGNOSIS — I10 Essential (primary) hypertension: Secondary | ICD-10-CM | POA: Diagnosis not present

## 2021-09-14 DIAGNOSIS — I213 ST elevation (STEMI) myocardial infarction of unspecified site: Secondary | ICD-10-CM | POA: Diagnosis not present

## 2021-09-14 MED ORDER — FENTANYL CITRATE PF 50 MCG/ML IJ SOSY
50.0000 ug | PREFILLED_SYRINGE | Freq: Once | INTRAMUSCULAR | Status: AC
  Administered 2021-09-14: 50 ug via INTRAVENOUS
  Filled 2021-09-14: qty 1

## 2021-09-14 NOTE — ED Provider Notes (Signed)
? ?Emergency Department Provider Note ? ? ?I have reviewed the triage vital signs and the nursing notes. ? ? ?HISTORY ? ?Chief Complaint ?Shortness of Breath (BIBA from home. C/o SOB starting this morning. 98% RA, 4L [place] for comfort by medic. BLE edema noted per medic. Dialysis pt and goes T/Th/Sat, Pt states compliance. (R arm restricted) ) and Hypertension (BP 254/162 with medic ) ? ? ?HPI ?Monique Henderson is a 71 y.o. female with PMH reviewed below including ESRD (T,R,S) HTN, PAF, presents to the ED with SOB. Symptoms developing since earlier this AM. No CP or fever. She reports compliance with HD with her last session yesterday. She was seen in the ED recently (4/10) with HD non-compliance and required HD from the ED but patient tells me that since that time, she has been going to HD. EMS arrived and placed patient on [place] O2 for comfort.   ? ? ?Past Medical History:  ?Diagnosis Date  ? AF (paroxysmal atrial fibrillation) ([place]) 05/29/2019  ? on Coumadin  ? Aortic atherosclerosis ([place]) 07/05/2019  ? Aortic dissection ([place]) 04/04/2019  ? s/p repair  ? Bone spur 2008  ? Right calcaneal foot spur  ? Breast cancer ([place]) 2004  ? Ductal carcinoma in situ of the left breast; S/P left partial mastectomy 02/26/2003; S/P re-excision of cranial and lateral margins11/18/2004.radiation  ? Cerebral thrombosis with cerebral infarction 05/22/2019  ? Chronic low back pain 06/22/2016  ? Chronic obstructive lung disease ([place]) 01/16/2017  ? 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 S/P breast lumpectomy 10/13/2012 by Dr. Autumn Messing; S/P re-excision of superior and inferior margins 10/27/2012.   ? ESRD on hemodialysis ([place]) 05/29/2019  ? Essential hypertension 09/16/2006  ? GERD 09/16/2006  ? Hepatitis C   ? treated and RNA confirmed not detectable 01/2017  ? Insomnia 03/14/2015  ? Malnutrition of moderate degree 05/19/2019  ? Non compliance with medical treatment 12/04/2017  ? Normocytic anemia   ? With  thrombocytosis  ? Osteoarthritis   ? Right ureteral stone 2002  ? S/P lumbar spinal fusion 01/18/2014  ? S/P lumbar decompressive laminectomy, fusion, and plating for lumbar spinal stenosis on 05/27/2009 by Dr. Eustace Moore.  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 Eustace Moore.    ? Tobacco use disorder 04/19/2009  ? Uterine fibroid   ? Wears dentures   ? top  ? ? ?Review of Systems ? ?Constitutional: No fever/chills ?Eyes: No visual changes. ?ENT: No sore throat. ?Cardiovascular: Denies chest pain. ?Respiratory: Positive shortness of breath. ?Gastrointestinal: No abdominal pain.  No nausea, no vomiting.  No diarrhea.  No constipation. ?Genitourinary: Negative for dysuria. ?Musculoskeletal: Negative for back pain. ?Skin: Negative for rash. ?Neurological: Negative for headaches, focal weakness or numbness. ? ? ?____________________________________________ ? ? ?PHYSICAL EXAM: ? ?VITAL SIGNS: ?ED Triage Vitals  ?Enc Vitals Group  ?   BP 09/14/21 2320 (!) 189/172  ?   Pulse Rate 09/14/21 2320 (!) 106  ?   Resp 09/14/21 2320 (!) 21  ?   Temp 09/14/21 2320 97.6 ?F (36.4 ?C)  ?   Temp Source 09/14/21 2320 Oral  ?   SpO2 09/14/21 2320 93 %  ?   Weight 09/14/21 2315 92 lb 9.5 oz (42 kg)  ?   Height 09/14/21 2315 '5\' 3"'$  (1.6 m)  ? ?  Constitutional: Alert and oriented. Patient appears uncomfortable with increased respiratory rate.  ?Eyes: Conjunctivae are normal.  ?Head: Atraumatic. ?Nose: No congestion/rhinnorhea. ?Mouth/Throat: Mucous membranes are moist.  ?Neck: No stridor.   ?Cardiovascular: Tachycardia. Good peripheral circulation. Grossly normal heart sounds.   ?Respiratory: Increased respiratory effort.  No retractions. Lungs CTAB. No wheezing.  ?Gastrointestinal: Soft and nontender. No distention.  ?Musculoskeletal: No  lower extremity tenderness with trace edema in the B/L LEs. No gross deformities of extremities. ?Neurologic:  Normal speech and language. No gross focal neurologic deficits are appreciated.  ?Skin:  Skin is warm, dry and intact. No rash noted. ? ?____________________________________________ ?  ?LABS ?(all labs ordered are listed, but only abnormal results are displayed) ? ?Labs Reviewed  ?COMPREHENSIVE METABOLIC PANEL - Abnormal; Notable for the following components:  ?    Result Value  ? Glucose, Bld 110 (*)   ? BUN 40 (*)   ? Creatinine, Ser 6.48 (*)   ? Calcium 10.5 (*)   ? GFR, Estimated 6 (*)   ? All other components within normal limits  ?MAGNESIUM - Abnormal; Notable for the following components:  ? Magnesium 2.5 (*)   ? All other components within normal limits  ?PHOSPHORUS - Abnormal; Notable for the following components:  ? Phosphorus 5.0 (*)   ? All other components within normal limits  ?CBC WITH DIFFERENTIAL/PLATELET - Abnormal; Notable for the following components:  ? RBC 3.67 (*)   ? Hemoglobin 10.6 (*)   ? HCT 34.8 (*)   ? RDW 18.3 (*)   ? All other components within normal limits  ?RESP PANEL BY RT-PCR (FLU A&B, COVID) [id number]  ? ?____________________________________________ ? ?EKG ? ? EKG Interpretation ? ?Date/Time:  Sunday September 14 2021 23:16:40 EDT ?Ventricular Rate:  104 ?PR Interval:  157 ?QRS Duration: 134 ?QT Interval:  352 ?QTC Calculation: 463 ?R Axis:   84 ?Text Interpretation: Sinus tachycardia Multiform ventricular premature complexes Probable left atrial enlargement Right bundle branch block Minimal ST elevation, lateral leads Similar to April 9th tracing Confirmed by Nanda Quinton [phone]) on 09/14/2021 11:18:21 PM ?  ? ?  ? ? ?____________________________________________ ? ?RADIOLOGY ? ?DG Chest Portable 1 View ? ?Result Date: 09/14/2021 ?CLINICAL DATA:  Short of breath EXAM: PORTABLE CHEST 1 VIEW COMPARISON:  09/07/2021 FINDINGS: Single frontal view of the chest demonstrate postsurgical  changes from median sternotomy. Right internal jugular dialysis catheter tip overlies atriocaval junction. Cardiac silhouette is enlarged. Stable vascular congestion and diffuse interstitial prominence. No airspace disease, effusion, or pneumothorax. No acute bony abnormalities. IMPRESSION: 1. Stable interstitial edema. Electronically Signed   By: Randa Ngo M.D.   On: 09/14/2021 23:55   ? ?____________________________________________ ? ? ?PROCEDURES ? ?Procedure(s) performed:  ? ?Procedures ? ?CRITICAL CARE ?Performed by: Margette Fast ?Total critical care time: 35 minutes ?Critical care time was exclusive of separately billable procedures and treating other patients. ?Critical care was necessary to treat or prevent imminent or life-threatening deterioration. ?Critical care was time spent personally by me on the following activities: development of treatment plan with patient and/or surrogate as well as nursing, discussions with consultants, evaluation of patient's response to treatment, examination of patient, obtaining history from patient or surrogate, ordering and performing treatments and interventions, ordering and review of laboratory studies, ordering and review of radiographic studies, pulse oximetry and re-evaluation of patient's condition. ? ?Nanda Quinton, MD ?Emergency Medicine ? ?____________________________________________ ? ? ?INITIAL IMPRESSION / ASSESSMENT AND PLAN / ED COURSE ? ?Pertinent labs & imaging results that  were available during my care of the patient were reviewed by me and considered in my medical decision making (see chart for details). ?  ?This patient is Presenting for Evaluation of SOB, which does require a range of treatment options, and is a complaint that involves a high risk of morbidity and mortality. ? ?The Differential Diagnoses include CAP, pulmonary edema, HTN emergency, HD non-compliance, PE, ACS. ? ?Critical Interventions-  ?  ?Medications  ?nicardipine (CARDENE) '20mg'$   in 0.86% saline 275m IV infusion (0.1 mg/ml) (has no administration in time range)  ?fentaNYL (SUBLIMAZE) injection 50 mcg (50 mcg Intravenous Given 09/14/21 2356)  ?labetalol (NORMODYNE) injection 10 mg (10 mg Int

## 2021-09-15 DIAGNOSIS — R0602 Shortness of breath: Secondary | ICD-10-CM | POA: Diagnosis present

## 2021-09-15 DIAGNOSIS — R0600 Dyspnea, unspecified: Secondary | ICD-10-CM | POA: Diagnosis not present

## 2021-09-15 LAB — CBC
HCT: 32.9 % — ABNORMAL LOW (ref 36.0–46.0)
Hemoglobin: 10.1 g/dL — ABNORMAL LOW (ref 12.0–15.0)
MCH: 28.9 pg (ref 26.0–34.0)
MCHC: 30.7 g/dL (ref 30.0–36.0)
MCV: 94.3 fL (ref 80.0–100.0)
Platelets: 289 10*3/uL (ref 150–400)
RBC: 3.49 MIL/uL — ABNORMAL LOW (ref 3.87–5.11)
RDW: 18.6 % — ABNORMAL HIGH (ref 11.5–15.5)
WBC: 7.3 10*3/uL (ref 4.0–10.5)
nRBC: 0 % (ref 0.0–0.2)

## 2021-09-15 LAB — MAGNESIUM: Magnesium: 2.5 mg/dL — ABNORMAL HIGH (ref 1.7–2.4)

## 2021-09-15 LAB — RENAL FUNCTION PANEL
Albumin: 3.6 g/dL (ref 3.5–5.0)
Anion gap: 11 (ref 5–15)
BUN: 41 mg/dL — ABNORMAL HIGH (ref 8–23)
CO2: 29 mmol/L (ref 22–32)
Calcium: 10.4 mg/dL — ABNORMAL HIGH (ref 8.9–10.3)
Chloride: 101 mmol/L (ref 98–111)
Creatinine, Ser: 6.74 mg/dL — ABNORMAL HIGH (ref 0.44–1.00)
GFR, Estimated: 6 mL/min — ABNORMAL LOW (ref >60)
Glucose, Bld: 117 mg/dL — ABNORMAL HIGH (ref 70–99)
Phosphorus: 5 mg/dL — ABNORMAL HIGH (ref 2.5–4.6)
Potassium: 4.2 mmol/L (ref 3.5–5.1)
Sodium: 141 mmol/L (ref 135–145)

## 2021-09-15 LAB — COMPREHENSIVE METABOLIC PANEL
ALT: 20 U/L (ref 0–44)
AST: 24 U/L (ref 15–41)
Albumin: 3.9 g/dL (ref 3.5–5.0)
Alkaline Phosphatase: 111 U/L (ref 38–126)
Anion gap: 11 (ref 5–15)
BUN: 40 mg/dL — ABNORMAL HIGH (ref 8–23)
CO2: 29 mmol/L (ref 22–32)
Calcium: 10.5 mg/dL — ABNORMAL HIGH (ref 8.9–10.3)
Chloride: 100 mmol/L (ref 98–111)
Creatinine, Ser: 6.48 mg/dL — ABNORMAL HIGH (ref 0.44–1.00)
GFR, Estimated: 6 mL/min — ABNORMAL LOW (ref >60)
Glucose, Bld: 110 mg/dL — ABNORMAL HIGH (ref 70–99)
Potassium: 4.2 mmol/L (ref 3.5–5.1)
Sodium: 140 mmol/L (ref 135–145)
Total Bilirubin: 0.7 mg/dL (ref 0.3–1.2)
Total Protein: 7.3 g/dL (ref 6.5–8.1)

## 2021-09-15 LAB — RESP PANEL BY RT-PCR (FLU A&B, COVID) ARPGX2
Influenza A by PCR: NEGATIVE
Influenza B by PCR: NEGATIVE
SARS Coronavirus 2 by RT PCR: NEGATIVE

## 2021-09-15 LAB — RAPID URINE DRUG SCREEN, HOSP PERFORMED
Amphetamines: NOT DETECTED
Barbiturates: NOT DETECTED
Benzodiazepines: NOT DETECTED
Cocaine: NOT DETECTED
Opiates: NOT DETECTED
Tetrahydrocannabinol: NOT DETECTED

## 2021-09-15 LAB — CBC WITH DIFFERENTIAL/PLATELET
Abs Immature Granulocytes: 0.03 10*3/uL (ref 0.00–0.07)
Basophils Absolute: 0.1 10*3/uL (ref 0.0–0.1)
Basophils Relative: 1 %
Eosinophils Absolute: 0.2 10*3/uL (ref 0.0–0.5)
Eosinophils Relative: 3 %
HCT: 34.8 % — ABNORMAL LOW (ref 36.0–46.0)
Hemoglobin: 10.6 g/dL — ABNORMAL LOW (ref 12.0–15.0)
Immature Granulocytes: 0 %
Lymphocytes Relative: 13 %
Lymphs Abs: 1 10*3/uL (ref 0.7–4.0)
MCH: 28.9 pg (ref 26.0–34.0)
MCHC: 30.5 g/dL (ref 30.0–36.0)
MCV: 94.8 fL (ref 80.0–100.0)
Monocytes Absolute: 0.8 10*3/uL (ref 0.1–1.0)
Monocytes Relative: 10 %
Neutro Abs: 5.5 10*3/uL (ref 1.7–7.7)
Neutrophils Relative %: 73 %
Platelets: 325 10*3/uL (ref 150–400)
RBC: 3.67 MIL/uL — ABNORMAL LOW (ref 3.87–5.11)
RDW: 18.3 % — ABNORMAL HIGH (ref 11.5–15.5)
WBC: 7.5 10*3/uL (ref 4.0–10.5)
nRBC: 0 % (ref 0.0–0.2)

## 2021-09-15 LAB — PHOSPHORUS: Phosphorus: 5 mg/dL — ABNORMAL HIGH (ref 2.5–4.6)

## 2021-09-15 MED ORDER — CARVEDILOL 3.125 MG PO TABS: 3.1250 mg | ORAL_TABLET | Freq: Two times a day (BID) | ORAL | Status: DC

## 2021-09-15 MED ORDER — MIRTAZAPINE 15 MG PO TABS
7.5000 mg | ORAL_TABLET | Freq: Every evening | ORAL | Status: DC | PRN
  Filled 2021-09-15: qty 1

## 2021-09-15 MED ORDER — APIXABAN 2.5 MG PO TABS: 2.5000 mg | ORAL_TABLET | Freq: Two times a day (BID) | ORAL | Status: DC

## 2021-09-15 MED ORDER — ISOSORBIDE MONONITRATE ER 30 MG PO TB24
30.0000 mg | ORAL_TABLET | Freq: Every day | ORAL | Status: DC
  Administered 2021-09-15: 30 mg via ORAL
  Filled 2021-09-15: qty 1

## 2021-09-15 MED ORDER — LOSARTAN POTASSIUM 50 MG PO TABS
100.0000 mg | ORAL_TABLET | Freq: Every day | ORAL | Status: DC
  Administered 2021-09-15: 100 mg via ORAL
  Filled 2021-09-15: qty 2

## 2021-09-15 MED ORDER — CLONIDINE HCL 0.1 MG/24HR TD PTWK: 0.1000 mg | MEDICATED_PATCH | TRANSDERMAL | Status: DC

## 2021-09-15 MED ORDER — AMLODIPINE BESYLATE 10 MG PO TABS
10.0000 mg | ORAL_TABLET | Freq: Every day | ORAL | Status: DC
  Administered 2021-09-15: 10 mg via ORAL
  Filled 2021-09-15: qty 1

## 2021-09-15 MED ORDER — PREGABALIN 100 MG PO CAPS: 100.0000 mg | ORAL_CAPSULE | Freq: Every day | ORAL | Status: DC

## 2021-09-15 MED ORDER — LABETALOL HCL 5 MG/ML IV SOLN
10.0000 mg | Freq: Once | INTRAVENOUS | Status: AC
  Administered 2021-09-15: 10 mg via INTRAVENOUS
  Filled 2021-09-15: qty 4

## 2021-09-15 MED ORDER — ROSUVASTATIN CALCIUM 5 MG PO TABS
10.0000 mg | ORAL_TABLET | Freq: Every day | ORAL | Status: DC
  Administered 2021-09-15: 10 mg via ORAL
  Filled 2021-09-15: qty 2

## 2021-09-15 MED ORDER — SEVELAMER CARBONATE 2.4 G PO PACK
2.4000 g | PACK | Freq: Three times a day (TID) | ORAL | Status: DC
  Administered 2021-09-15: 2.4 g via ORAL
  Filled 2021-09-15 (×5): qty 1

## 2021-09-15 MED ORDER — NICARDIPINE HCL IN NACL 20-0.86 MG/200ML-% IV SOLN
3.0000 mg/h | INTRAVENOUS | Status: DC
  Administered 2021-09-15: 2.5 mg/h via INTRAVENOUS
  Administered 2021-09-15: 5 mg/h via INTRAVENOUS
  Filled 2021-09-15 (×2): qty 200

## 2021-09-15 MED ORDER — APIXABAN 2.5 MG PO TABS
2.5000 mg | ORAL_TABLET | Freq: Two times a day (BID) | ORAL | Status: DC
  Administered 2021-09-15: 2.5 mg via ORAL
  Filled 2021-09-15: qty 1

## 2021-09-15 MED ORDER — LOSARTAN POTASSIUM 50 MG PO TABS: 100.0000 mg | ORAL_TABLET | Freq: Every day | ORAL | Status: DC

## 2021-09-15 MED ORDER — HYDRALAZINE HCL 50 MG PO TABS
50.0000 mg | ORAL_TABLET | Freq: Three times a day (TID) | ORAL | Status: DC
  Administered 2021-09-15: 50 mg via ORAL
  Filled 2021-09-15: qty 1

## 2021-09-15 MED ORDER — CARVEDILOL 3.125 MG PO TABS
3.1250 mg | ORAL_TABLET | Freq: Two times a day (BID) | ORAL | Status: DC
Start: 2021-09-15 — End: 2021-09-15
  Administered 2021-09-15: 3.125 mg via ORAL
  Filled 2021-09-15: qty 1

## 2021-09-15 MED ORDER — CHLORHEXIDINE GLUCONATE CLOTH 2 % EX PADS: 6.0000 | MEDICATED_PAD | Freq: Every day | CUTANEOUS | Status: DC

## 2021-09-15 MED ORDER — AMLODIPINE BESYLATE 5 MG PO TABS: 10.0000 mg | ORAL_TABLET | Freq: Every day | ORAL | Status: DC

## 2021-09-15 MED ORDER — ALBUTEROL SULFATE (2.5 MG/3ML) 0.083% IN NEBU: 3.0000 mL | INHALATION_SOLUTION | RESPIRATORY_TRACT | Status: DC | PRN

## 2021-09-15 NOTE — ED Notes (Signed)
RN discussed with patient to obtain UA sample. Pt states "dont produce much urine". MD Nancy Fetter notified. Will obtain UA when patient is able to urinate.  ?

## 2021-09-15 NOTE — Discharge Summary (Signed)
Family Medicine Teaching Service ?Hospital Discharge Summary ? ?Patient name: Monique Henderson Medical record number: [id number] ?Date of birth: 1951/02/10 Age: 71 y.o. Gender: female ?Date of Admission: 09/14/2021  Date of Discharge: 09/15/21 ?Admitting Physician: Erskine Emery, MD ? ?Primary Care Provider: Myrtie Neither, PA-C ?Consultants: Nephrology ? ?Indication for Hospitalization: Dyspnea ? ?Discharge Diagnoses/Problem List:  ?Dyspnea ?Severe Asymptomatic Hypertension without end organ damage ?HFmrEF ?ESRD on HD TTS ?Anemia of chronic disease ?Hyperlipidemia ?Paroxysmal A-fib ?Hx of COPD ? ?Disposition: Home ? ?Discharge Condition: Stable ? ?Discharge Exam:  ?General: Resting comfortably in bed, NAD, thin elderly female  ?Cardiovascular: RRR, 2+ peripheral pulses, no peripheral edema ?Respiratory: CTAB, no increased WOB, comfortable on room and and speaking in full sentences ?Abdomen: soft, non-tender, non-distended ?Extremities: warm, dry, no peripheral edema noted ? ?Brief Hospital Course:  ?Monique Henderson is a 71 y.o.female with a history of ESRD on HD TTS, HFmrEF, anemia of chronic disease, hyperlipidemia, paroxysmal A-fib with RVR, hypertension who was admitted to the family medicine teaching Service at [facility] for dyspnea without hypoxia. Her hospital course is detailed below: ? 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  Chest x-ray showed no focal changes but did show interstitial edema.  Patient denied missing any HD treatments.  Nephrology was consulted on admission and continued with HD 09/15/21. Patient had improvement in symptoms after HD. ? ?Uncontrolled hypertension ?Blood pressure in the ED on admission 230/130s.  Patient was started on nicardipine gtt but was quickly transitioned off with initiation of dialysis session.  Patient's home medications were restarted after dialysis and  patient tolerated them well. On discharge, the patient's blood pressure was still significantly elevated but anticipate that this will improve while she takes her medications as prescribed, as she was not prior to admission. Patient reports she will follow-up with her PCP in the next 1-2 days.  ? ?Anemia of chronic disease electrolyte derangements ?Patient presented to the emergency room with elevation of phosphorus, calcium, magnesium.  Hemoglobin was around baseline on admission.  Nephrology was consulted and patient underwent HD with significant improvement. Patient to follow-up with HD as scheduled outpatient.  ? ? ?Other chronic conditions were medically managed with home medications and formulary alternatives as necessary ? ?PCP Follow-up Recommendations: ?Assess compliance with HD sessions ?Assess compliance with home blood pressure regimen ?Assess need for O2 at home ? ? ?Significant Procedures:  ? ?Significant Labs and Imaging:  ?Recent Labs  ?Lab 09/15/21 ?0001 09/15/21 ?0419  ?WBC 7.5 7.3  ?HGB 10.6* 10.1*  ?HCT 34.8* 32.9*  ?PLT 325 289  ? ?Recent Labs  ?Lab 09/15/21 ?0001 09/15/21 ?0419  ?NA 140 141  ?K 4.2 4.2  ?CL 100 101  ?CO2 29 29  ?GLUCOSE 110* 117*  ?BUN 40* 41*  ?CREATININE 6.48* 6.74*  ?CALCIUM 10.5* 10.4*  ?MG 2.5*  --   ?PHOS 5.0* 5.0*  ?ALKPHOS 111  --   ?AST 24  --   ?ALT 20  --   ?ALBUMIN 3.9 3.6  ? ? ? ? ?Results/Tests Pending at Time of Discharge: none ? ?Discharge Medications:  ?Allergies as of 09/15/2021   ? ?   Reactions  ? Shrimp [shellfish Allergy] Shortness Of Breath  ? Bactroban [mupirocin] Other (See Comments)  ? "Sores in nose"  ? Vicodin [hydrocodone-acetaminophen] Itching, Nausea And Vomiting  ? This is patient's home medication  ? Eliquis [apixaban] Itching  ?  Spoke with patient, no rash.  Has tolerated on re-challenge.  ? Lisinopril Cough  ? Tylenol [acetaminophen] Itching  ? ?  ? ?  ?Medication List  ?  ? ?TAKE these medications   ? ?albuterol 108 (90 Base) MCG/ACT  inhaler ?Commonly known as: VENTOLIN HFA ?Inhale 2 puffs into the lungs every 6 (six) hours as needed for wheezing or shortness of breath. ?  ?amLODipine 10 MG tablet ?Commonly known as: NORVASC ?Take 1 tablet (10 mg total) by mouth daily. ?  ?apixaban 2.5 MG Tabs tablet ?Commonly known as: ELIQUIS ?Take 1 tablet (2.5 mg total) by mouth 2 (two) times daily. ?  ?carvedilol 3.125 MG tablet ?Commonly known as: COREG ?Take 1 tablet (3.125 mg total) by mouth 2 (two) times daily with a meal. ?  ?cetirizine 10 MG tablet ?Commonly known as: ZYRTEC ?Take 10 mg by mouth daily as needed for allergies. ?  ?cloNIDine 0.1 mg/24hr patch ?Commonly known as: CATAPRES - Dosed in mg/24 hr ?Place 1 patch (0.1 mg total) onto the skin every Saturday. ?  ?hydrALAZINE 50 MG tablet ?Commonly known as: APRESOLINE ?Take 1 tablet (50 mg total) by mouth 3 (three) times daily. ?  ?ibuprofen 200 MG tablet ?Commonly known as: ADVIL ?Take 600 mg by mouth every 6 (six) hours as needed for headache or moderate pain. ?  ?isosorbide mononitrate 30 MG 24 hr tablet ?Commonly known as: IMDUR ?Take 1 tablet (30 mg total) by mouth daily. ?  ?loperamide 2 MG capsule ?Commonly known as: IMODIUM ?Take 2 mg by mouth as needed for diarrhea or loose stools. ?  ?losartan 100 MG tablet ?Commonly known as: COZAAR ?Take 1 tablet (100 mg total) by mouth daily. ?  ?mirtazapine 7.5 MG tablet ?Commonly known as: REMERON ?Take 1 tablet (7.5 mg total) by mouth at bedtime. ?What changed:  ?when to take this ?reasons to take this ?  ?Narcan 4 MG/0.1ML Liqd nasal spray kit ?Generic drug: naloxone ?Place 1 spray into the nose as needed for opioid reversal. If no response within 3 minutes then repeat once in the other nostril.  Call 911. ?  ?pregabalin 50 MG capsule ?Commonly known as: LYRICA ?Take 100 mg by mouth at bedtime. ?  ?rosuvastatin 10 MG tablet ?Commonly known as: CRESTOR ?Take 1 tablet (10 mg total) by mouth daily. ?  ?sevelamer carbonate 2.4 g Pack ?Commonly known  as: RENVELA ?Take 2.4 g by mouth 3 (three) times daily with meals. ?  ?VITAMIN D PO ?Take 5,000 Units by mouth daily. ?  ? ?  ? ? ?Discharge Instructions: Please refer to Patient Instructions section of EMR for full details.  Patient was counseled important signs and symptoms that should prompt return to medical care, changes in medications, dietary instructions, activity restrictions, and follow up appointments.  ? ?Follow-Up Appointments: ?PCP follow-up in the next 1-2 days. ? ?Rise Patience, DO ?09/15/2021, 5:11 PM ?PGY-1, [place] ?

## 2021-09-15 NOTE — Hospital Course (Addendum)
Monique Henderson is a 70 y.o.female with a history of ESRD on HD TTS, HFmrEF, anemia of chronic disease, hyperlipidemia, paroxysmal A-fib with RVR, hypertension who was admitted to the family medicine teaching Service at [facility] for dyspnea without hypoxia. Her hospital course is detailed below: ? 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  Chest x-ray showed no focal changes but did show interstitial edema.  Patient denied missing any HD treatments.  Nephrology was consulted on admission and continued with HD 09/15/21. Patient had improvement in symptoms after HD. ? ?Uncontrolled hypertension ?Blood pressure in the ED on admission 230/130s.  Patient was started on nicardipine gtt but was quickly transitioned off with initiation of dialysis session.  Patient's home medications were restarted after dialysis and patient tolerated them well. On discharge, the patient's blood pressure was still significantly elevated but anticipate that this will improve while she takes her medications as prescribed, as she was not prior to admission. Patient reports she will follow-up with her PCP in the next 1-2 days.  ? ?Anemia of chronic disease electrolyte derangements ?Patient presented to the emergency room with elevation of phosphorus, calcium, magnesium.  Hemoglobin was around baseline on admission.  Nephrology was consulted and patient underwent HD with significant improvement. Patient to follow-up with HD as scheduled outpatient.  ? ? ?Other chronic conditions were medically managed with home medications and formulary alternatives as necessary ? ?PCP Follow-up Recommendations: ?Assess compliance with HD sessions ?Assess compliance with home blood pressure regimen ?Assess need for O2 at home ?

## 2021-09-15 NOTE — Progress Notes (Addendum)
Family Medicine Teaching Service ?Daily Progress Note ?Intern Pager: [phone] ? ?Patient name: Monique Henderson Medical record number: [id number] ?Date of birth: [birth year] Age: 71 y.o. Gender: female ? ?Primary Care Provider: Myrtie Neither, PA-C ?Consultants: Nephrology ?Code Status: Full ? ?Pt Overview and Major Events to Date:  ?4/17 admitted ? ?Assessment and Plan: ?ONESTY CLAIR is a 71 y.o. female presenting with shortness of breath. PMH is significant for ESRD on HD TTS, HFmrEF, anemia of chronic disease, hyperlipidemia, paroxysmal A-fib with RVR, hypertension. ? ?Dyspnea, resolved ?HFmrEF ?Denies dyspnea at this time.  UDS negative. Likely secondary to fluid overload versus medication noncompliance ?-Strict I's/O ?-Continue cardiac monitoring ?-Maintain O2 sat greater than 90% ?-Albuterol every 4 hour as needed ?-PT/OT eval and treat ? ?Hypertension, uncontrolled ?BP improving. SBP 160s while in room. VSS otherwise stable on room air.  ?Home med: Amlodipine, Coreg, clonidine patch, hydralazine 50 mg 3 times daily, Cozaar 100 ?-Wean off nicardipine gtt as able ?-Continue amlodipine 10 mg daily ?-Continue Coreg 3.125 twice daily with meals ?-Clonidine patch every Saturday ?-Hydralazine 50 mg 3 times daily ?-Cozaar 100 mg daily ? ?Anemia of Chronic Disease ?Electrolyte Derangements ?ESRD on HD TTS ?Hgb 10.6>10.1. Cr 6.48>6.74. Phos 5.0. HD today ?-Nephrology following, appreciate recommendations ?-Defer HD per nephrology ?-A.m. BMP ?-Continue home med Renvela ? ?HLD  ?Home med: Crestor 10 mg daily ?-Continue home med ? ?pAF with RVR, stable ?Heart rate in the 90s ?-Continue home Eliquis 2.5 mg BID. ?  ?COPD ?-Albuterol nebulized every 4 hours as needed ordered ? ?Insomnia ?-Continue Remeron 7.5 mg nightly as needed ? ?FEN/GI: Regular ?PPx: Eliquis ?Dispo:Home today. Barriers include ongoing medical treatment.  ? ?Subjective:  ?Patient seen and assessed at bedside.  Patient resting comfortably.  Denies  any acute complaints at this time.  Denies any shortness of breath. ? ?Objective: ?Temp:  [97.6 ?F (36.4 ?C)] 97.6 ?F (36.4 ?C) (04/16 2320) ?Pulse Rate:  [92-109] 96 (04/17 0415) ?Resp:  [16-23] 19 (04/17 0415) ?BP: (143-236)/(76-172) 191/114 (04/17 0600) ?SpO2:  [92 %-100 %] 97 % (04/17 0415) ?Weight:  [42 kg] 42 kg (04/16 2315) ?Physical Exam: ?General: Resting comfortably, NAD ?Cardiovascular: RRR ?Respiratory: CTA B ?Abdomen: Soft nontender, bowel sounds heard all 4 quadrants ?Extremities: Warm, dry ? ?Laboratory: ?Recent Labs  ?Lab 09/15/21 ?0001 09/15/21 ?0419  ?WBC 7.5 7.3  ?HGB 10.6* 10.1*  ?HCT 34.8* 32.9*  ?PLT 325 289  ? ?Recent Labs  ?Lab 09/15/21 ?0001 09/15/21 ?0419  ?NA 140 141  ?K 4.2 4.2  ?CL 100 101  ?CO2 29 29  ?BUN 40* 41*  ?CREATININE 6.48* 6.74*  ?CALCIUM 10.5* 10.4*  ?PROT 7.3  --   ?BILITOT 0.7  --   ?ALKPHOS 111  --   ?ALT 20  --   ?AST 24  --   ?GLUCOSE 110* 117*  ? ? ? ? ?Imaging/Diagnostic Tests: ?DG Chest Portable 1 View ? ?Result Date: 09/14/2021 ?CLINICAL DATA:  Short of breath EXAM: PORTABLE CHEST 1 VIEW COMPARISON:  09/07/2021 FINDINGS: Single frontal view of the chest demonstrate postsurgical changes from median sternotomy. Right internal jugular dialysis catheter tip overlies atriocaval junction. Cardiac silhouette is enlarged. Stable vascular congestion and diffuse interstitial prominence. No airspace disease, effusion, or pneumothorax. No acute bony abnormalities. IMPRESSION: 1. Stable interstitial edema. Electronically Signed   By: Randa Ngo M.D.   On: 09/14/2021 23:55   ? ?France Ravens, MD ?09/15/2021, 6:58 AM ?PGY-1, [place] ?[place] Intern pager: [phone], text pages welcome ?

## 2021-09-15 NOTE — H&P (Addendum)
Family Medicine Teaching Service ?Hospital Admission History and Physical ?Service Pager: [phone] ? ?Patient name: Monique Henderson Medical record number: [id number] ?Date of birth: September 06, 1950 Age: 71 y.o. Gender: female ? ?Primary Care Provider: Myrtie Neither, PA-C ?Consultants: Nephrology ?Code Status: FULL  ?Preferred Emergency Contact:  ?Contact Information   ? ? Name Relation Home Work Mobile  ? Fox,Lurlene Sister   [phone]  ? Teti,Steve Significant other   [phone]  ? Koraima, Albertsen Son   [phone]  ? ?  ? ?Chief Complaint: dyspnea ? ?Assessment and Plan: ?Monique Henderson is a 71 y.o. female presenting with shortness of breath. PMH is significant for ESRD on HD TTS, HFmrEF, anemia of chronic disease, hyperlipidemia, paroxysmal A-fib with RVR, hypertension. ? ?Dyspnea  ESRD HD TTS  HFmrEF ?Pertinent lab findings in ED: elevated magnesium, elevated phosphorous and calcium with anemia. Likely hypervolemic state and shortness of breath without documented hypoxia are due to ESRD with need for an extra HD session. Possibly cardiac etiology as echo has been performed recently with mildly reduced ejection fraction (06/19/21: LVEF 40-45% and G3DD, severe left ventricular concentric hypertrophy). However, EKG shows sinus tachycardia with RBBB and PVCs but is otherwise unremarkable. With no evidence of chest pain, there is a very low likelihood of ACS.  Less likely COPD exacerbation or CAP as cause without infectious or systemic symptoms, CXR shows no evidence of focal changes.  Uncontrolled hypertension could also be contributing to this symptom (see below).  Nephrology consulted, will see the patient tomorrow, possibly will do an HD session. ?- Admit to FPTS (progressive), Dr. Thompson Grayer attending ?- Vital signs per protocol ?- Strict ins and outs ?- Continuous cardiac monitoring ?- Maintain oxygen saturation >90% ?- Incentive spirometry ?- AM CBC and RFP ?- UDS ordered ?- Nephrology consulted,  appreciate recommendations ?- Restarted home Renvela ? ?Uncontrolled HTN  ?Patient presented to the ED with blood pressure 230s/130s, reports compliance with her blood pressure medications and has clonidine patch on.  Home medications: Amlodipine 10 mg daily, losartan 100 mg daily, clonidine patch every Saturday, hydralazine 1 tab 3 times daily.  Patient was started on nicardipine drip while in the ED as blood pressure did not respond to a dose of labetalol.  We will slowly decrease to prevent poor perfusion. ?- S/p labetalol 10 mg IV ?- Weaning nicardipine gtt ?-Maintain systolic blood pressure > 160, < 200 ?-Restarting home amlodipine, losartan tomorrow ?- Continue clonidine patch ?- We will restart hydralazine as able ?- HD per nephrology ? ?Anemia of Chronic Disease  Electrolyte Derangements ?Hgb today 10.6 and appears close to baseline (9-11). Phosphorous, calcium, and magnesium elevated on admission, potassium wnl. Will defer to nephrology for treatment. ?- Continue with HD per nephrology recommendations ?- AM CBC ? ?HLD  ?LDL 40 on last lipid panel 06/21/2021.  Home medication includes Crestor 10 mg daily.  No need to repeat lipid panel at this time. ?- Continue Crestor 10 mg daily ? ?pAF with RVR  ?Currently in sinus rhythm in room.  Patient is rate controlled in the 90s.  Continue home Eliquis 2.5 mg BID. ? ?COPD ?Albuterol 1 to 2 puffs every 4 hours as needed ordered.  Patient has no true oxygen requirement, has been on 2 L nasal cannula for comfort.  No documentation of hypoxia. ? ?FEN/GI: Renal with fluid restriction  ?Prophylaxis: Eliquis  ? ?Disposition: Progressive (Nicardipine gtt)  ? ?History of Present Illness:  Monique Henderson is a 71 y.o. female presenting with dyspnea. PMH  is significant for ESRD on HD TTS, HFmrEF, anemia of chronic disease, hyperlipidemia, paroxysmal A-fib with RVR, hypertension. Patient reports of dyspnea that started yesterday morning. She thinks it is due to eating ice.  Reports associated productive cough, with white sputum. Denies chest pain, abdominal pain. Still makes some urine.  Patient also reports of some swelling in the lower extremities that began at the same time.  The swelling has not changed in severity. ? ?In the room, patient reports that she is no longer experiencing shortness of breath. ? ?Patient goes to dialysis Tuesday, Thursday, and Saturday. She states she has been not missed any dialysis sessions and that her last session was Saturday. Her nephrologist is Dr. Clover Mealy. ? ?Smokes 2 cigarettes a day. Denies alcohol use and recreational drug use including cocaine. ? ?Review Of Systems: Per HPI with the following additions:  ? ?Review of Systems  ?Constitutional:  Negative for activity change, appetite change, chills and fever.  ?HENT:  Negative for congestion and sore throat.   ?Eyes:  Negative for pain.  ?Respiratory:  Positive for cough and shortness of breath.   ?Cardiovascular:  Negative for chest pain.  ?Gastrointestinal:  Negative for abdominal pain, diarrhea, nausea and vomiting.  ?Genitourinary:  Negative for difficulty urinating and dysuria.  ?Skin:  Negative for rash.  ?Neurological:  Negative for dizziness, weakness and headaches.  ?Psychiatric/Behavioral:  Negative for confusion.    ? ?Patient Active Problem List  ? Diagnosis Date Noted  ? SOB (shortness of breath) 09/15/2021  ? Hypertensive emergency 08/23/2021  ? Altered mental status   ? ESRD (end stage renal disease) on dialysis ([place]) 08/12/2021  ? Malignant hypertension 06/19/2021  ? Anemia 06/05/2021  ? Pure hypercholesterolemia 02/14/2021  ? Acute pulmonary edema ([place]) 12/23/2020  ? Acute respiratory failure with hypoxia ([place]) 09/21/2020  ? Acute renal failure superimposed on chronic kidney disease ([place]) 09/21/2020  ? Community acquired pneumonia 09/21/2020  ? Uremia 09/21/2020  ? Metabolic acidosis 54/00/8676  ? Hypertensive crisis 09/21/2020  ? On Coumadin for atrial fibrillation ([place])  10/03/2019  ? Troponin level elevated 08/28/2019  ? Positive D dimer 08/28/2019  ? Hyperkalemia 08/28/2019  ? SVT (supraventricular tachycardia) ([place]) 08/28/2019  ? SIRS (systemic inflammatory response syndrome) ([place]) 08/27/2019  ? Paroxysmal SVT (supraventricular tachycardia) ([place]) 08/27/2019  ? Aortic atherosclerosis ([place]) 07/05/2019  ? Chest pain 07/05/2019  ? History of CVA (cerebrovascular accident) 05/29/2019  ? AF (paroxysmal atrial fibrillation) ([place]) 05/29/2019  ? ESRD on dialysis [facility]) 05/29/2019  ? Cerebral thrombosis with cerebral infarction 05/22/2019  ? Malnutrition of moderate degree 05/19/2019  ? Pleural effusion 05/18/2019  ? Atrial fibrillation with RVR ([place]) 05/18/2019  ? S/P aortic aneurysm repair 04/07/2019  ? Aortic dissection ([place]) 04/04/2019  ? Dissection of aorta ([place]) 04/03/2019  ? Non compliance with medical treatment 12/04/2017  ? CKD (chronic kidney disease), stage IV ([place]) 07/19/2017  ? Chronic obstructive lung disease ([place]) 01/16/2017  ? Chronic low back pain 06/22/2016  ? Insomnia 03/14/2015  ? S/P lumbar spinal fusion 01/18/2014  ? Tobacco use disorder 04/19/2009  ? Essential hypertension 09/16/2006  ? GERD 09/16/2006  ? ? ?Past Medical History: ?Past Medical History:  ?Diagnosis Date  ? AF (paroxysmal atrial fibrillation) ([place]) 05/29/2019  ? on Coumadin  ? Aortic atherosclerosis ([place]) 07/05/2019  ? Aortic dissection ([place]) 04/04/2019  ? s/p repair  ? Bone spur 2008  ? Right calcaneal foot spur  ? Breast cancer ([place]) 2004  ? Ductal carcinoma in situ of the left  breast; S/P left partial mastectomy 02/26/2003; S/P re-excision of cranial and lateral margins11/18/2004.radiation  ? Cerebral thrombosis with cerebral infarction 05/22/2019  ? Chronic low back pain 06/22/2016  ? Chronic obstructive lung disease ([place]) 01/16/2017  ? 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 S/P breast lumpectomy 10/13/2012 by Dr. Autumn Messing; S/P re-excision of superior and inferior margins  10/27/2012.   ? ESRD on hemodialysis ([place]) 05/29/2019  ? Essential hypertension 09/16/2006  ? GERD 09/16/2006  ? Hepatitis C   ? treated and RNA confirmed not detectable 01/2017  ? Insomnia 03/14/2015  ? Malnutrition of mode

## 2021-09-15 NOTE — Plan of Care (Signed)
  Problem: Acute Rehab PT Goals(only PT should resolve) Goal: Pt Will Go Supine/Side To Sit Outcome: Adequate for Discharge Goal: Patient Will Transfer Sit To/From Stand Outcome: Adequate for Discharge Goal: Pt Will Ambulate Outcome: Adequate for Discharge   

## 2021-09-15 NOTE — Care Management Obs Status (Signed)
MEDICARE OBSERVATION STATUS NOTIFICATION ? ? ?Patient Details  ?Name: Monique Henderson ?MRN: [id number] ?Date of Birth: 04-10-1951 ? ? ?Medicare Observation Status Notification Given:  Yes ? ? ? ?Cyndi Bender, RN ?09/15/2021, 3:31 PM ?

## 2021-09-15 NOTE — Discharge Instructions (Signed)
You were hospitalized due to your shortness of breath and were given an extra session of dialysis with improvement. Your blood pressures remained significantly elevated. It will be important to take your home medications as prescribed and follow-up with your PCP outpatient in their office. Please make sure to attend all dialysis sessions as scheduled.  ?

## 2021-09-15 NOTE — Progress Notes (Signed)
Called to patient room as she is requesting to go home. She states that her blood pressures are always very elevated and that she is going to follow-up with her PCP in the next few days. She is agreeable to taking her medications as prescribed. Dicussed risks of her blood pressure being elevated and to make sure to have close follow-up and she is agreeable. Discussed with attending Dr. Thompson Grayer, and we will plan for discharge.  ? ? ?Hokulani Rogel, DO  ?

## 2021-09-15 NOTE — Progress Notes (Signed)
[facility] Nephrology Quick Note ? ?S: ?Ms. Vandevander presented to ED with dyspnea which started early Sunday morning and has progressed. No CP, N/V/D, fever or chills. CXR with pulm edema. BP initially sky high - requiring nicardipine drip (which has now been weaned off). She was admitted [facility] for dialysis. ? ?O: ?Blood pressure (!) 187/116, pulse 86, temperature (!) 97.5 ?F (36.4 ?C), temperature source Oral, resp. rate 17, height '5\' 3"'$  (1.6 m), weight 42 kg, SpO2 92 %. ? ?Gen: Well appearing, NAD. On room air at the moment, but will place on nasal O2 for comfort during HD. ?CV: RRR; no murmur ?PULM: Rhonchi throughout ?ABD: Soft, non-tender ?EXTREM: No LE edema ?ACCESS: TDC ? ?Labs reviewed: K 4.2, Phos 5, Hgb 10.1 ? ?Outpatient HD orders: ?TTS at AF ?3:45hr, 400/1.5, EDW 43.5kg, 2K/2Ca, UFP #2, TDC, no heparin ?- Mircera 136mg IV q 2 weeks ?- Hectoral 462m IV q HD ? ? ?A/P: ? ?Dyspnea/Pulm edema: HD today to correct with 4L UFG.  ?HTN urgency: Hopefully will come down with home meds and UF. ?ESRD: HD as above. Usual schedule is TTS. If BP/symptoms improved after HD today, would be reasonable to discharge home today, and to her usual HD tomorrow. ?Full consult to follow if changed to INPATIENT status ? ?KaVeneta PentonPA-C ?[facility] ?Pager [phone] ?

## 2021-09-15 NOTE — Procedures (Signed)
No note

## 2021-09-15 NOTE — Evaluation (Signed)
Physical Therapy Evaluation ?Patient Details ?Name: Monique Henderson ?MRN: [id number] ?DOB: [birth year] ?Today's Date: 09/15/2021 ? ?History of Present Illness ? Pt is a 71 y/o female admitted secondary to increased SOB. Workup pending. PMH includes ESRD on HD, tobacco use, HTN, Afib.  ?Clinical Impression ? Pt admitted secondary to problem above with deficits below. Pt requiring min A for bed mobility and min guard A for mobility in the room. Pt limited secondary to fatigue. Feel pt would benefit from HHPT at d/c to address current deficits. Will continue to follow acutely.  ?   ? ?Recommendations for follow up therapy are one component of a multi-disciplinary discharge planning process, led by the attending physician.  Recommendations may be updated based on patient status, additional functional criteria and insurance authorization. ? ?Follow Up Recommendations Home health PT ? ?  ?Assistance Recommended at Discharge Intermittent Supervision/Assistance  ?Patient can return home with the following ? Help with stairs or ramp for entrance;Assistance with cooking/housework ? ?  ?Equipment Recommendations None recommended by PT  ?Recommendations for Other Services ?    ?  ?Functional Status Assessment Patient has had a recent decline in their functional status and demonstrates the ability to make significant improvements in function in a reasonable and predictable amount of time.  ? ?  ?Precautions / Restrictions Precautions ?Precautions: Fall ?Restrictions ?Weight Bearing Restrictions: No  ? ?  ? ?Mobility ? Bed Mobility ?Overal bed mobility: Needs Assistance ?Bed Mobility: Supine to Sit, Sit to Supine ?  ?  ?Supine to sit: Min assist ?Sit to supine: Supervision ?  ?General bed mobility comments: Min A for trunk elevation to come to sitting on stretcher. ?  ? ?Transfers ?Overall transfer level: Needs assistance ?Equipment used: 1 person hand held assist ?Transfers: Sit to/from Stand ?Sit to Stand: Min guard ?  ?  ?  ?  ?   ?General transfer comment: Min guard for safety. ?  ? ?Ambulation/Gait ?Ambulation/Gait assistance: Min guard ?Gait Distance (Feet): 10 Feet ?Assistive device: None ?Gait Pattern/deviations: Step-through pattern, Decreased stride length ?Gait velocity: Decreased ?  ?  ?General Gait Details: Mild unsteadiness noted within ED room. Mild SOB noted. Oxygen sats WFL. Pt sleepy, so wanted to limit mobility to within the room. ? ?Stairs ?  ?  ?  ?  ?  ? ?Wheelchair Mobility ?  ? ?Modified Rankin (Stroke Patients Only) ?  ? ?  ? ?Balance Overall balance assessment: Needs assistance ?Sitting-balance support: No upper extremity supported, Feet supported ?Sitting balance-Leahy Scale: Fair ?  ?  ?Standing balance support: No upper extremity supported ?Standing balance-Leahy Scale: Fair ?  ?  ?  ?  ?  ?  ?  ?  ?  ?  ?  ?  ?   ? ? ? ?Pertinent Vitals/Pain Pain Assessment ?Pain Assessment: No/denies pain  ? ? ?Home Living Family/patient expects to be discharged to:: Private residence ?Living Arrangements: Spouse/significant other ?Available Help at Discharge: Family;Available PRN/intermittently ?Type of Home: Apartment ?Home Access: Level entry ?  ?  ?Alternate Level Stairs-Number of Steps: stair lift up to bedroom ?Home Layout: Two level ?Home Equipment: Conservation officer, nature (2 wheels);Cane - single point;BSC/3in1 ?   ?  ?Prior Function Prior Level of Function : Independent/Modified Independent ?  ?  ?  ?  ?  ?  ?  ?  ?  ? ? ?Hand Dominance  ?   ? ?  ?Extremity/Trunk Assessment  ? Upper Extremity Assessment ?Upper Extremity Assessment: Defer to OT evaluation ?  ? ?  Lower Extremity Assessment ?Lower Extremity Assessment: Generalized weakness ?  ? ?Cervical / Trunk Assessment ?Cervical / Trunk Assessment: Kyphotic  ?Communication  ? Communication: No difficulties  ?Cognition Arousal/Alertness: Awake/alert ?Behavior During Therapy: Flat affect ?Overall Cognitive Status: No family/caregiver present to determine baseline cognitive  functioning ?  ?  ?  ?  ?  ?  ?  ?  ?  ?  ?  ?  ?  ?  ?  ?  ?General Comments: Slow processing noted ?  ?  ? ?  ?General Comments   ? ?  ?Exercises    ? ?Assessment/Plan  ?  ?PT Assessment Patient needs continued PT services  ?PT Problem List Decreased strength;Decreased mobility;Decreased balance;Decreased activity tolerance;Decreased cognition;Decreased safety awareness ? ?   ?  ?PT Treatment Interventions DME instruction;Gait training;Functional mobility training;Therapeutic exercise;Therapeutic activities;Balance training;Patient/family education   ? ?PT Goals (Current goals can be found in the Care Plan section)  ?Acute Rehab PT Goals ?Patient Stated Goal: to go home ?PT Goal Formulation: With patient ?Time For Goal Achievement: 09/29/21 ?Potential to Achieve Goals: Good ? ?  ?Frequency Min 3X/week ?  ? ? ?Co-evaluation   ?  ?  ?  ?  ? ? ?  ?AM-PAC PT "6 Clicks" Mobility  ?Outcome Measure Help needed turning from your back to your side while in a flat bed without using bedrails?: A Little ?Help needed moving from lying on your back to sitting on the side of a flat bed without using bedrails?: A Little ?Help needed moving to and from a bed to a chair (including a wheelchair)?: A Little ?Help needed standing up from a chair using your arms (e.g., wheelchair or bedside chair)?: A Little ?Help needed to walk in hospital room?: A Little ?Help needed climbing 3-5 steps with a railing? : A Little ?6 Click Score: 18 ? ?  ?End of Session Equipment Utilized During Treatment: Gait belt ?Activity Tolerance: Patient limited by fatigue ?Patient left: in bed;with call bell/phone within reach (on stretcher in ED) ?Nurse Communication: Mobility status ?PT Visit Diagnosis: Unsteadiness on feet (R26.81);Muscle weakness (generalized) (M62.81) ?  ? ?Time: 0810-0822 ?PT Time Calculation (min) (ACUTE ONLY): 12 min ? ? ?Charges:   PT Evaluation ?$PT Eval Low Complexity: 1 Low ?  ?  ?   ? ? ?Reuel Derby, PT, DPT  ?Acute Rehabilitation  Services  ?Pager: [phone] ?Office: [phone] ? ? ?[place] ?09/15/2021, 9:50 AM ?

## 2021-09-15 NOTE — Progress Notes (Signed)
PIVs removed at this time.  Educated patient on AVS.  Patient will all belongings at this time.  [facility] called and stated they would arrive in 10-15 min. ? ?

## 2021-09-15 NOTE — ED Notes (Signed)
Per MD Nancy Fetter maintain patient SBP 160-200s. ?

## 2021-09-15 NOTE — TOC Initial Note (Addendum)
Transition of Care (TOC) - Initial/Assessment Note  ? ? ?Patient Details  ?Name: Monique Henderson ?MRN: [id number] ?Date of Birth: 03-17-1951 ? ?Transition of Care (TOC) CM/SW Contact:    ?Cyndi Bender, RN ?Phone Number: ?09/15/2021, 3:32 PM ? ?Clinical Narrative:         ?Spoke to patient regarding transition needs. Pateint declines Home health services. States she doesn't need them. Patient states she no longer has a car and will need taxi voucher. Voucher left on hard chart. RNCM explained patient's medicaid transportation resource to get to apts. ?Address, Phone number and PCP verified.  ?TOC will continue to follow    ? ? ?Expected Discharge Plan: [place] ?Barriers to Discharge: Continued Medical Work up ? ? ?Patient Goals and CMS Choice ?Patient states their goals for this hospitalization and ongoing recovery are:: return home ?  ?  ? ?Expected Discharge Plan and Services ?Expected Discharge Plan: [place] ?  ?Discharge Planning Services: CM Consult ?  ?Living arrangements for the past 2 months: Apartment ?                ?  ?  ?  ?  ?  ?  ?  ?  ?  ?  ? ?Prior Living Arrangements/Services ?Living arrangements for the past 2 months: Apartment ?Lives with:: Significant Other ?Patient language and need for interpreter reviewed:: Yes ?Do you feel safe going back to the place where you live?: Yes      ?Need for Family Participation in Patient Care: Yes (Comment) ?Care giver support system in place?: Yes (comment) ?Current home services: DME ([facility]) ?Criminal Activity/Legal Involvement Pertinent to Current Situation/Hospitalization: No - Comment as needed ? ?Activities of Daily Living ?  ?  ? ?Permission Sought/Granted ?Permission sought to share information with : Case Manager ?  ?   ?   ?   ?   ? ?Emotional Assessment ?Appearance:: Appears stated age ?Attitude/Demeanor/Rapport: Engaged ?Affect (typically observed): Accepting ?Orientation: : Oriented to Situation,  Oriented to  Time, Oriented to Place, Oriented to Self ?Alcohol / Substance Use: Not Applicable ?Psych Involvement: No (comment) ? ?Admission diagnosis:  SOB (shortness of breath) [R06.02] ?Hypertensive urgency [I16.0] ?Acute respiratory failure with hypoxia ([place]) [J96.01] ?Patient Active Problem List  ? Diagnosis Date Noted  ? SOB (shortness of breath) 09/15/2021  ? Hypertensive emergency 08/23/2021  ? Altered mental status   ? ESRD (end stage renal disease) on dialysis ([place]) 08/12/2021  ? Malignant hypertension 06/19/2021  ? Anemia 06/05/2021  ? Pure hypercholesterolemia 02/14/2021  ? Acute pulmonary edema ([place]) 12/23/2020  ? Acute respiratory failure with hypoxia ([place]) 09/21/2020  ? Acute renal failure superimposed on chronic kidney disease ([place]) 09/21/2020  ? Community acquired pneumonia 09/21/2020  ? Uremia 09/21/2020  ? Metabolic acidosis 28/76/8115  ? Hypertensive crisis 09/21/2020  ? On Coumadin for atrial fibrillation ([place]) 10/03/2019  ? Troponin level elevated 08/28/2019  ? Positive D dimer 08/28/2019  ? Hyperkalemia 08/28/2019  ? SVT (supraventricular tachycardia) ([place]) 08/28/2019  ? SIRS (systemic inflammatory response syndrome) ([place]) 08/27/2019  ? Paroxysmal SVT (supraventricular tachycardia) ([place]) 08/27/2019  ? Aortic atherosclerosis ([place]) 07/05/2019  ? Chest pain 07/05/2019  ? History of CVA (cerebrovascular accident) 05/29/2019  ? AF (paroxysmal atrial fibrillation) ([place]) 05/29/2019  ? ESRD on dialysis [facility]) 05/29/2019  ? Cerebral thrombosis with cerebral infarction 05/22/2019  ? Malnutrition of moderate degree 05/19/2019  ? Pleural effusion 05/18/2019  ? Atrial fibrillation with RVR ([place]) 05/18/2019  ?  S/P aortic aneurysm repair 04/07/2019  ? Aortic dissection ([place]) 04/04/2019  ? Dissection of aorta ([place]) 04/03/2019  ? Non compliance with medical treatment 12/04/2017  ? CKD (chronic kidney disease), stage IV ([place]) 07/19/2017  ? Chronic obstructive lung disease ([place]) 01/16/2017  ? Chronic low back  pain 06/22/2016  ? Insomnia 03/14/2015  ? S/P lumbar spinal fusion 01/18/2014  ? Tobacco use disorder 04/19/2009  ? Essential hypertension 09/16/2006  ? GERD 09/16/2006  ? ?PCP:  Myrtie Neither, PA-C ?Pharmacy:   ?[place] [place] [facility] [address] [address] ?Phone: [phone] Fax: [phone] ? ? ? ? ?Social Determinants of Health (SDOH) Interventions ?  ? ?Readmission Risk Interventions ? ?  05/22/2019  ?  4:31 PM  ?Readmission Risk Prevention Plan  ?Transportation Screening Complete  ?PCP or Specialist Appt within 3-5 Days Complete  ?[place] or Home Care Consult Complete  ?Social Work Consult for [place] Planning/Counseling Complete  ?Palliative Care Screening Not Applicable  ?Medication Review Press photographer) Complete  ? ? ? ?

## 2021-09-15 NOTE — Evaluation (Signed)
Occupational Therapy Evaluation ?Patient Details ?Name: Monique Henderson ?MRN: [id number] ?DOB: [birth year] ?Today's Date: 09/15/2021 ? ? ?History of Present Illness Pt is a 71 y/o female admitted secondary to increased SOB. Workup pending. PMH includes ESRD on HD, tobacco use, HTN, Afib.  ? ?Clinical Impression ?  ?Monique Henderson was evaluated s/p the above admission. She reports being indep with ADLs and mobility PTA and since her last d/c from [facility]. Upon evaluation she demonstrated indep ability to complete ADLs and functional mobility, no SOB or increase in WOB noted. She reports that she feels at her baseline functionally. Pt's BP was high upon arrival, and remained high throughout (see general comments section below). She does not have further acute needs. Recommend d/c to home with assistance only as needed.  ?   ? ?Recommendations for follow up therapy are one component of a multi-disciplinary discharge planning process, led by the attending physician.  Recommendations may be updated based on patient status, additional functional criteria and insurance authorization.  ? ?Follow Up Recommendations ? No OT follow up  ?  ?Assistance Recommended at Discharge PRN  ?Patient can return home with the following Direct supervision/assist for medications management;Help with stairs or ramp for entrance;Assist for transportation ? ?  ?Functional Status Assessment ? Patient has had a recent decline in their functional status and demonstrates the ability to make significant improvements in function in a reasonable and predictable amount of time.  ?Equipment Recommendations ? None recommended by OT  ?  ?   ?Precautions / Restrictions Precautions ?Precautions: Fall ?Restrictions ?Weight Bearing Restrictions: No  ? ?  ? ?Mobility Bed Mobility ?Overal bed mobility: Independent ?  ?  ?  ?  ?  ?  ?  ?  ? ?Transfers ?Overall transfer level: Independent ?  ?  ?  ?  ?  ?  ?  ?  ?  ?  ? ?  ?Balance Overall balance assessment: Independent ?  ?   ?  ?  ?  ?  ?  ?  ?  ?  ?  ?  ?  ?  ?  ?  ?  ?  ?   ? ?ADL either performed or assessed with clinical judgement  ? ?ADL Overall ADL's : Independent;At baseline ?  ?  ?  ?  ?  ?  ?Functional mobility during ADLs: Independent ?General ADL Comments: Pt demonstrated indep ability to complete ADLs, no AD, no LOB  ? ? ? ?Vision Baseline Vision/History: 1 Wears glasses ?Ability to See in Adequate Light: 0 Adequate ?Patient Visual Report: No change from baseline ?Vision Assessment?: No apparent visual deficits  ?   ?   ?   ? ?Pertinent Vitals/Pain Pain Assessment ?Pain Assessment: No/denies pain ?Faces Pain Scale: Hurts a little bit ?Pain Location: head ?Pain Descriptors / Indicators: Headache ?Pain Intervention(s): Monitored during session  ? ? ? ?Hand Dominance Right ?  ?Extremity/Trunk Assessment Upper Extremity Assessment ?Upper Extremity Assessment: Generalized weakness ?  ?Lower Extremity Assessment ?Lower Extremity Assessment: Defer to PT evaluation ?  ?Cervical / Trunk Assessment ?Cervical / Trunk Assessment: Kyphotic ?  ?Communication Communication ?Communication: No difficulties ?  ?Cognition Arousal/Alertness: Awake/alert ?Behavior During Therapy: Flat affect ?Overall Cognitive Status: No family/caregiver present to determine baseline cognitive functioning ?  ?  ?  ?  ?  ?General Comments: Slow processing noted ?  ?  ?General Comments  BP high upon arrival at 207/125 and the 192/122 after functional tasks ? ?  ? ?Home Living  Family/patient expects to be discharged to:: Private residence ?Living Arrangements: Spouse/significant other ?Available Help at Discharge: Family;Available PRN/intermittently ?Type of Home: Apartment ?Home Access: Level entry ?  ?  ?Home Layout: Two level ?Alternate Level Stairs-Number of Steps: stair lift up to bedroom ?Alternate Level Stairs-Rails: Right ?Bathroom Shower/Tub: Tub/shower unit ?  ?Bathroom Toilet: Standard ?  ?  ?Home Equipment: Conservation officer, nature (2 wheels);Cane - single  point;BSC/3in1 ?  ?Additional Comments: fiance stays with pt ?  ? ?  ?Prior Functioning/Environment Prior Level of Function : Independent/Modified Independent ?  ?  ?  ?  ?  ?  ?Mobility Comments: indep per report ?ADLs Comments: reports indep including driving, medication and money ?  ? ?  ?  ?OT Problem List: Decreased activity tolerance ?  ?   ?OT Treatment/Interventions:    ?  ?OT Goals(Current goals can be found in the care plan section) Acute Rehab OT Goals ?Patient Stated Goal: to go home ?OT Goal Formulation: With patient ?Time For Goal Achievement: 09/15/21 ?Potential to Achieve Goals: Good  ?   ? ?   ?AM-PAC OT "6 Clicks" Daily Activity     ?Outcome Measure Help from another person eating meals?: None ?Help from another person taking care of personal grooming?: None ?Help from another person toileting, which includes using toliet, bedpan, or urinal?: None ?Help from another person bathing (including washing, rinsing, drying)?: None ?Help from another person to put on and taking off regular upper body clothing?: None ?Help from another person to put on and taking off regular lower body clothing?: None ?6 Click Score: 24 ?  ?End of Session Nurse Communication: Mobility status ? ?Activity Tolerance: Patient tolerated treatment well ?Patient left: in chair;with call bell/phone within reach ? ?OT Visit Diagnosis: Other abnormalities of gait and mobility (R26.89)  ?              ?Time: 0370-4888 ?OT Time Calculation (min): 20 min ?Charges:  OT General Charges ?$OT Visit: 1 Visit ?OT Evaluation ?$OT Eval Low Complexity: 1 Low ? ? ? ?Valory Wetherby A Jamile Rekowski ?09/15/2021, 4:39 PM ?

## 2021-09-16 ENCOUNTER — Telehealth (HOSPITAL_COMMUNITY): Payer: Self-pay | Admitting: Nephrology

## 2021-09-16 DIAGNOSIS — N2581 Secondary hyperparathyroidism of renal origin: Secondary | ICD-10-CM | POA: Diagnosis not present

## 2021-09-16 DIAGNOSIS — Z992 Dependence on renal dialysis: Secondary | ICD-10-CM | POA: Diagnosis not present

## 2021-09-16 DIAGNOSIS — N186 End stage renal disease: Secondary | ICD-10-CM | POA: Diagnosis not present

## 2021-09-16 NOTE — Telephone Encounter (Signed)
Transition of care contact from inpatient facility ? ?Date of discharge: 09/15/2021 ?Date of contact: 09/16/2021 ?Method: Phone ?Spoke to: Patient ? ?Patient contacted to discuss transition of care from recent inpatient hospitalization. Patient was admitted to [facility] from 4/16 - 09/15/21 with discharge diagnosis of HTN urgency, dyspnea/overload. ? ?Medication changes were reviewed. She has everything, says took BP meds today. ? ?Patient will follow up with his/her outpatient HD unit on TTS schedule - she is there now. Encouraged to continue regular HD without skipping to avoid recurrent hospitalizations. ? ?No add'l needs at thsi time. ? ?Veneta Penton, PA-C ?[facility] ?Pager [phone] ? ?  ?

## 2021-09-20 DIAGNOSIS — Z992 Dependence on renal dialysis: Secondary | ICD-10-CM | POA: Diagnosis not present

## 2021-09-20 DIAGNOSIS — N186 End stage renal disease: Secondary | ICD-10-CM | POA: Diagnosis not present

## 2021-09-20 DIAGNOSIS — N2581 Secondary hyperparathyroidism of renal origin: Secondary | ICD-10-CM | POA: Diagnosis not present

## 2021-09-23 DIAGNOSIS — Z992 Dependence on renal dialysis: Secondary | ICD-10-CM | POA: Diagnosis not present

## 2021-09-23 DIAGNOSIS — N186 End stage renal disease: Secondary | ICD-10-CM | POA: Diagnosis not present

## 2021-09-23 DIAGNOSIS — N2581 Secondary hyperparathyroidism of renal origin: Secondary | ICD-10-CM | POA: Diagnosis not present

## 2021-09-25 DIAGNOSIS — N186 End stage renal disease: Secondary | ICD-10-CM | POA: Diagnosis not present

## 2021-09-25 DIAGNOSIS — Z992 Dependence on renal dialysis: Secondary | ICD-10-CM | POA: Diagnosis not present

## 2021-09-25 DIAGNOSIS — N2581 Secondary hyperparathyroidism of renal origin: Secondary | ICD-10-CM | POA: Diagnosis not present

## 2021-09-27 DIAGNOSIS — N186 End stage renal disease: Secondary | ICD-10-CM | POA: Diagnosis not present

## 2021-09-27 DIAGNOSIS — Z992 Dependence on renal dialysis: Secondary | ICD-10-CM | POA: Diagnosis not present

## 2021-09-27 DIAGNOSIS — N2581 Secondary hyperparathyroidism of renal origin: Secondary | ICD-10-CM | POA: Diagnosis not present

## 2021-09-28 DIAGNOSIS — N186 End stage renal disease: Secondary | ICD-10-CM | POA: Diagnosis not present

## 2021-09-28 DIAGNOSIS — I129 Hypertensive chronic kidney disease with stage 1 through stage 4 chronic kidney disease, or unspecified chronic kidney disease: Secondary | ICD-10-CM | POA: Diagnosis not present

## 2021-09-28 DIAGNOSIS — Z992 Dependence on renal dialysis: Secondary | ICD-10-CM | POA: Diagnosis not present

## 2021-09-30 DIAGNOSIS — Z992 Dependence on renal dialysis: Secondary | ICD-10-CM | POA: Diagnosis not present

## 2021-09-30 DIAGNOSIS — N186 End stage renal disease: Secondary | ICD-10-CM | POA: Diagnosis not present

## 2021-09-30 DIAGNOSIS — N2581 Secondary hyperparathyroidism of renal origin: Secondary | ICD-10-CM | POA: Diagnosis not present

## 2021-10-04 DIAGNOSIS — Z992 Dependence on renal dialysis: Secondary | ICD-10-CM | POA: Diagnosis not present

## 2021-10-04 DIAGNOSIS — N2581 Secondary hyperparathyroidism of renal origin: Secondary | ICD-10-CM | POA: Diagnosis not present

## 2021-10-04 DIAGNOSIS — N186 End stage renal disease: Secondary | ICD-10-CM | POA: Diagnosis not present

## 2021-10-07 DIAGNOSIS — N186 End stage renal disease: Secondary | ICD-10-CM | POA: Diagnosis not present

## 2021-10-07 DIAGNOSIS — Z992 Dependence on renal dialysis: Secondary | ICD-10-CM | POA: Diagnosis not present

## 2021-10-07 DIAGNOSIS — N2581 Secondary hyperparathyroidism of renal origin: Secondary | ICD-10-CM | POA: Diagnosis not present

## 2021-10-09 DIAGNOSIS — N186 End stage renal disease: Secondary | ICD-10-CM | POA: Diagnosis not present

## 2021-10-09 DIAGNOSIS — Z992 Dependence on renal dialysis: Secondary | ICD-10-CM | POA: Diagnosis not present

## 2021-10-09 DIAGNOSIS — N2581 Secondary hyperparathyroidism of renal origin: Secondary | ICD-10-CM | POA: Diagnosis not present

## 2021-10-09 NOTE — Telephone Encounter (Signed)
ERROR

## 2021-10-10 ENCOUNTER — Other Ambulatory Visit: Payer: Self-pay | Admitting: Family Medicine

## 2021-10-11 DIAGNOSIS — N2581 Secondary hyperparathyroidism of renal origin: Secondary | ICD-10-CM | POA: Diagnosis not present

## 2021-10-11 DIAGNOSIS — N186 End stage renal disease: Secondary | ICD-10-CM | POA: Diagnosis not present

## 2021-10-11 DIAGNOSIS — Z992 Dependence on renal dialysis: Secondary | ICD-10-CM | POA: Diagnosis not present

## 2021-10-13 ENCOUNTER — Other Ambulatory Visit: Payer: Self-pay | Admitting: Family Medicine

## 2021-10-14 DIAGNOSIS — N186 End stage renal disease: Secondary | ICD-10-CM | POA: Diagnosis not present

## 2021-10-14 DIAGNOSIS — Z992 Dependence on renal dialysis: Secondary | ICD-10-CM | POA: Diagnosis not present

## 2021-10-14 DIAGNOSIS — N2581 Secondary hyperparathyroidism of renal origin: Secondary | ICD-10-CM | POA: Diagnosis not present

## 2021-10-15 ENCOUNTER — Other Ambulatory Visit: Payer: Self-pay | Admitting: *Deleted

## 2021-10-15 DIAGNOSIS — N186 End stage renal disease: Secondary | ICD-10-CM

## 2021-10-16 DIAGNOSIS — N186 End stage renal disease: Secondary | ICD-10-CM | POA: Diagnosis not present

## 2021-10-16 DIAGNOSIS — Z992 Dependence on renal dialysis: Secondary | ICD-10-CM | POA: Diagnosis not present

## 2021-10-16 DIAGNOSIS — N2581 Secondary hyperparathyroidism of renal origin: Secondary | ICD-10-CM | POA: Diagnosis not present

## 2021-10-16 NOTE — Progress Notes (Deleted)
Office Note     CC:  ESRD Requesting Provider:  Myrtie Neither, PA-C  HPI: Monique Henderson is a {Handed:22697} handed 71 y.o. (April 08, 1951) female with kidney disease who presents at the request of Myrtie Neither, PA-C for permanent HD access. The patient has had *** prior access procedures. Per pt, previous tunneled lines have been placed in ***. Current access is ***. Dialysis days are ***.   On exam, ***   #1 right IJ tunneled hemodialysis catheter, #2 ligation of left brachio cephalic AV fistula ligation todd early 08/2020   L jugular venogram, R EJ HD CVC placement chronic L innom vein occlusion    The pt is *** on a statin for cholesterol management.  The pt is *** on a daily aspirin.   Other AC:  *** The pt is *** on medications for hypertension.   The pt is *** diabetic. Tobacco hx:  ***  Past Medical History:  Diagnosis Date   AF (paroxysmal atrial fibrillation) ([place]) 05/29/2019   on Coumadin   Aortic atherosclerosis ([place]) 07/05/2019   Aortic dissection ([place]) 04/04/2019   s/p repair   Bone spur 2008   Right calcaneal foot spur   Breast cancer ([place]) 2004   Ductal carcinoma in situ of the left breast; S/P left partial mastectomy 02/26/2003; S/P re-excision of cranial and lateral margins11/18/2004.radiation   Cerebral thrombosis with cerebral infarction 05/22/2019   Chronic low back pain 06/22/2016   Chronic obstructive lung disease ([place]) 01/16/2017   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 Autumn Messing; S/P re-excision of superior and inferior margins 10/27/2012.    ESRD on hemodialysis ([place]) 05/29/2019   Essential hypertension 09/16/2006   GERD 09/16/2006   Hepatitis C    treated and RNA confirmed not detectable 01/2017   Insomnia 03/14/2015   Malnutrition of moderate degree 05/19/2019   Non compliance with medical treatment 12/04/2017   Normocytic anemia    With thrombocytosis   Osteoarthritis     Right ureteral stone 2002   S/P lumbar spinal fusion 01/18/2014   S/P lumbar decompressive laminectomy, fusion, and plating for lumbar spinal stenosis on 05/27/2009 by Dr. Eustace Moore.  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 Eustace Moore.     Tobacco use disorder 04/19/2009   Uterine fibroid    Wears dentures    top    Past Surgical History:  Procedure Laterality Date   ANTERIOR LAT LUMBAR FUSION N/A 01/18/2014   Procedure: ANTERIOR LATERAL LUMBAR FUSION LUMBAR TWO-THREE;  Surgeon: Eustace Moore, MD;  Location: [place] [facility];  Service: Neurosurgery;  Laterality: N/A;   ANTERIOR LUMBAR FUSION  01/18/2014   AV FISTULA PLACEMENT Left 04/20/2019   Procedure: ARTERIOVENOUS (AV) FISTULA CREATION;  Surgeon: [name], MD;  Location: [place];  Service: Vascular;  Laterality: Left;   BACK SURGERY     BREAST LUMPECTOMY Left 01/2003   BREAST LUMPECTOMY Right 2014   BREAST LUMPECTOMY WITH NEEDLE LOCALIZATION AND AXILLARY SENTINEL LYMPH NODE BX Right 10/13/2012   Procedure: BREAST LUMPECTOMY WITH NEEDLE LOCALIZATION;  Surgeon: [name], MD;  Location: [place];  Service: General;  Laterality: Right;  Right breast wire localized lumpectomy   INSERTION OF DIALYSIS CATHETER Right 04/20/2019   Procedure: INSERTION  OF DIALYSIS CATHETER, right internal jugular;  Surgeon: [name], MD;  Location: [place];  Service: Vascular;  Laterality: Right;   INSERTION OF DIALYSIS CATHETER Right 09/24/2020   Procedure: INSERTION OF TUNNELED DIALYSIS CATHETER;  Surgeon: Rosetta Posner, MD;  Location: [place];  Service: Vascular;  Laterality: Right;   IR FLUORO GUIDE CV LINE RIGHT  09/22/2020   IR THORACENTESIS ASP PLEURAL SPACE W/IMG GUIDE  05/19/2019   IR US GUIDE VASC ACCESS LEFT   09/22/2020   IR US GUIDE VASC ACCESS RIGHT  09/22/2020   IR VENOCAVAGRAM SVC  09/22/2020   LAMINECTOMY  05/27/2009   Lumbar decompressive laminectomy, fusion and plating for lumbar spinal stensosis   LIGATION OF ARTERIOVENOUS  FISTULA Left 09/24/2020   Procedure: LIGATION OF LEFT ARM ARTERIOVENOUS  FISTULA;  Surgeon: Rosetta Posner, MD;  Location: [place];  Service: Vascular;  Laterality: Left;   LUMBAR LAMINECTOMY/DECOMPRESSION MICRODISCECTOMY Left 03/23/2013   Procedure: LUMBAR LAMINECTOMY/DECOMPRESSION MICRODISCECTOMY 1 LEVEL;  Surgeon: Eustace Moore, MD;  Location: [place] [facility];  Service: Neurosurgery;  Laterality: Left;  LUMBAR LAMINECTOMY/DECOMPRESSION MICRODISCECTOMY 1 LEVEL   MASTECTOMY, PARTIAL Left 02/26/2003   ; S/P re-excision of cranial and lateral margins 04/19/2003.    RE-EXCISION OF BREAST CANCER,SUPERIOR MARGINS Right 10/27/2012   Procedure: RE-EXCISION OF BREAST CANCER,SUPERIOR and inferior MARGINS;  Surgeon: [name], MD;  Location: [place];  Service: General;  Laterality: Right;   RE-EXCISION OF BREAST LUMPECTOMY Left 04/2003   TEE WITHOUT CARDIOVERSION N/A 04/04/2019   Procedure: Transesophageal Echocardiogram ([facility]);  Surgeon: Wonda Olds, MD;  Location: [place];  Service: Open Heart Surgery;  Laterality: N/A;   THORACIC AORTIC ANEURYSM REPAIR N/A 04/04/2019   Procedure: THORACIC ASCENDING ANEURYSM REPAIR (AAA)  USING 28 MM X 30 CM HEMASHIELD PLATINUM VASCULAR GRAFT;  Surgeon: Wonda Olds, MD;  Location: [facility];  Service: Open Heart Surgery;  Laterality: N/A;    Social History   Socioeconomic History   Marital status: Single    Spouse name: Not on file   Number of children: 2   Years of education: Not on file   Highest education level: Not on file  Occupational History   Not on file  Tobacco Use   Smoking status: Former    Packs/day: 0.25    Years: 44.00    Pack years: 11.00    Types: Cigarettes    Quit date: 05/24/2019    Years since quitting: 2.4    Smokeless tobacco: Never   Tobacco comments:    smoking less  Vaping Use   Vaping Use: Never used  Substance and Sexual Activity   Alcohol use: Not Currently    Alcohol/week: 2.0 standard drinks    Types: 2 Cans of beer per week    Comment:  "couple beers a weekend"   Drug use: Not Currently    Types: Cocaine    Comment: "stopped back in the 1980's"   Sexual activity: Yes    Birth control/protection: Post-menopausal  Other Topics Concern   Not on file  Social History Narrative   Not on file   Social Determinants of Health   Financial Resource Strain: Not on file  Food Insecurity: Not on file  Transportation Needs: Not on file  Physical Activity: Not on file  Stress: Not on file  Social Connections: Not on file  Intimate Partner Violence: Not on file   *** Family History  Problem Relation Age of Onset   Colon cancer  Mother 58   Hypertension Mother    Diabetes Sister 27   Hypertension Sister    Diabetes Brother    Hypertension Brother    Diabetes Brother    Hypertension Brother    Kidney disease Son        s/p renal transplant   Hypertension Son    Diabetes Son    Multiple sclerosis Son    Bone cancer Sister 32   Breast cancer Neg Hx    Cervical cancer Neg Hx     Current Outpatient Medications  Medication Sig Dispense Refill   albuterol (VENTOLIN HFA) 108 (90 Base) MCG/ACT inhaler Inhale 2 puffs into the lungs every 6 (six) hours as needed for wheezing or shortness of breath. 8.5 g 0   amLODipine (NORVASC) 10 MG tablet Take 1 tablet (10 mg total) by mouth daily. 90 tablet 0   apixaban (ELIQUIS) 2.5 MG TABS tablet Take 1 tablet (2.5 mg total) by mouth 2 (two) times daily. 60 tablet 0   carvedilol (COREG) 3.125 MG tablet Take 1 tablet (3.125 mg total) by mouth 2 (two) times daily with a meal. 60 tablet 0   cetirizine (ZYRTEC) 10 MG tablet Take 10 mg by mouth daily as needed for allergies.     cloNIDine (CATAPRES - DOSED IN MG/24 HR) 0.1 mg/24hr patch Place 1  patch (0.1 mg total) onto the skin every Saturday. 4 patch 12   hydrALAZINE (APRESOLINE) 50 MG tablet Take 1 tablet (50 mg total) by mouth 3 (three) times daily. 90 tablet 0   ibuprofen (ADVIL) 200 MG tablet Take 600 mg by mouth every 6 (six) hours as needed for headache or moderate pain.     isosorbide mononitrate (IMDUR) 30 MG 24 hr tablet Take 1 tablet (30 mg total) by mouth daily. 30 tablet 0   loperamide (IMODIUM) 2 MG capsule Take 2 mg by mouth as needed for diarrhea or loose stools.     losartan (COZAAR) 100 MG tablet Take 1 tablet (100 mg total) by mouth daily. 90 tablet 0   mirtazapine (REMERON) 7.5 MG tablet Take 1 tablet (7.5 mg total) by mouth at bedtime. (Patient taking differently: Take 7.5 mg by mouth at bedtime as needed (sleep).) 90 tablet 3   NARCAN 4 MG/0.1ML LIQD nasal spray kit Place 1 spray into the nose as needed for opioid reversal. If no response within 3 minutes then repeat once in the other nostril.  Call 911.     pregabalin (LYRICA) 50 MG capsule Take 100 mg by mouth at bedtime.     rosuvastatin (CRESTOR) 10 MG tablet Take 1 tablet (10 mg total) by mouth daily. 90 tablet 0   sevelamer carbonate (RENVELA) 2.4 g PACK Take 2.4 g by mouth 3 (three) times daily with meals.     VITAMIN D PO Take 5,000 Units by mouth daily.     No current facility-administered medications for this visit.    Allergies  Allergen Reactions   Shrimp [Shellfish Allergy] Shortness Of Breath   Bactroban [Mupirocin] Other (See Comments)    "Sores in nose"   Vicodin [Hydrocodone-Acetaminophen] Itching and Nausea And Vomiting    This is patient's home medication   Eliquis [Apixaban] Itching    Spoke with patient, no rash.  Has tolerated on re-challenge.   Lisinopril Cough   Tylenol [Acetaminophen] Itching     REVIEW OF SYSTEMS:  *** _0  denotes positive finding, _1  denotes negative finding Cardiac  Comments:  Chest pain or chest pressure:  Shortness of breath upon exertion:    Short  of breath when lying flat:    Irregular heart rhythm:        Vascular    Pain in calf, thigh, or hip brought on by ambulation:    Pain in feet at night that wakes you up from your sleep:     Blood clot in your veins:    Leg swelling:         Pulmonary    Oxygen at home:    Productive cough:     Wheezing:         Neurologic    Sudden weakness in arms or legs:     Sudden numbness in arms or legs:     Sudden onset of difficulty speaking or slurred speech:    Temporary loss of vision in one eye:     Problems with dizziness:         Gastrointestinal    Blood in stool:     Vomited blood:         Genitourinary    Burning when urinating:     Blood in urine:        Psychiatric    Major depression:         Hematologic    Bleeding problems:    Problems with blood clotting too easily:        Skin    Rashes or ulcers:        Constitutional    Fever or chills:      PHYSICAL EXAMINATION:  There were no vitals filed for this visit.  General:  WDWN in NAD; vital signs documented above Gait: Not observed HENT: WNL, normocephalic Pulmonary: normal non-labored breathing , without Rales, rhonchi,  wheezing Cardiac: {Desc; regular/irreg:14544} HR, without  Murmurs {With/Without:20273} carotid bruit*** Abdomen: soft, NT, no masses Skin: {With/Without:20273} rashes Vascular Exam/Pulses:  Right Left  Radial {Exam; arterial pulse strength 0-4:30167} {Exam; arterial pulse strength 0-4:30167}  Ulnar {Exam; arterial pulse strength 0-4:30167} {Exam; arterial pulse strength 0-4:30167}  Femoral {Exam; arterial pulse strength 0-4:30167} {Exam; arterial pulse strength 0-4:30167}  Popliteal {Exam; arterial pulse strength 0-4:30167} {Exam; arterial pulse strength 0-4:30167}  DP {Exam; arterial pulse strength 0-4:30167} {Exam; arterial pulse strength 0-4:30167}  PT {Exam; arterial pulse strength 0-4:30167} {Exam; arterial pulse strength 0-4:30167}   Extremities: {With/Without:20273}  ischemic changes, {With/Without:20273} Gangrene , {With/Without:20273} cellulitis; {With/Without:20273} open wounds;  Musculoskeletal: no muscle wasting or atrophy  Neurologic: A&O X 3;  No focal weakness or paresthesias are detected Psychiatric:  The pt has {Desc; normal/abnormal:11317::"Normal"} affect.   Non-Invasive Vascular Imaging:   ***    ASSESSMENT/PLAN:  Monique Henderson is a 71 y.o. female who presents with {KidneyDisease:19197::"end stage renal disease","chronic kidney disease stage ***"}  Based on vein mapping and examination, ***. I had an extensive discussion with this patient in regards to the nature of access surgery, including risk, benefits, and alternatives.   The patient is aware that the risks of access surgery include but are not limited to: bleeding, infection, steal syndrome, nerve damage, ischemic monomelic neuropathy, failure of access to mature, complications related to venous hypertension, and possible need for additional access procedures in the future. *** I discussed with the patient the nature of the staged access procedure, specifically the need for a second operation to transpose the first stage fistula if it matures adequately.   The patient has *** agreed to proceed with the above procedure which will be scheduled ***.  Broadus John,  MD [facility] [phone]

## 2021-10-17 ENCOUNTER — Encounter (HOSPITAL_COMMUNITY): Payer: Medicaid Other

## 2021-10-17 ENCOUNTER — Other Ambulatory Visit (HOSPITAL_COMMUNITY): Payer: Medicaid Other

## 2021-10-17 ENCOUNTER — Ambulatory Visit: Payer: Medicaid Other | Admitting: Vascular Surgery

## 2021-10-21 DIAGNOSIS — N186 End stage renal disease: Secondary | ICD-10-CM | POA: Diagnosis not present

## 2021-10-21 DIAGNOSIS — N2581 Secondary hyperparathyroidism of renal origin: Secondary | ICD-10-CM | POA: Diagnosis not present

## 2021-10-21 DIAGNOSIS — Z992 Dependence on renal dialysis: Secondary | ICD-10-CM | POA: Diagnosis not present

## 2021-10-23 DIAGNOSIS — Z992 Dependence on renal dialysis: Secondary | ICD-10-CM | POA: Diagnosis not present

## 2021-10-23 DIAGNOSIS — N2581 Secondary hyperparathyroidism of renal origin: Secondary | ICD-10-CM | POA: Diagnosis not present

## 2021-10-23 DIAGNOSIS — N186 End stage renal disease: Secondary | ICD-10-CM | POA: Diagnosis not present

## 2021-10-25 DIAGNOSIS — N2581 Secondary hyperparathyroidism of renal origin: Secondary | ICD-10-CM | POA: Diagnosis not present

## 2021-10-25 DIAGNOSIS — N186 End stage renal disease: Secondary | ICD-10-CM | POA: Diagnosis not present

## 2021-10-25 DIAGNOSIS — Z992 Dependence on renal dialysis: Secondary | ICD-10-CM | POA: Diagnosis not present

## 2021-10-29 ENCOUNTER — Encounter (HOSPITAL_COMMUNITY): Payer: Self-pay | Admitting: Emergency Medicine

## 2021-10-29 ENCOUNTER — Other Ambulatory Visit: Payer: Self-pay

## 2021-10-29 ENCOUNTER — Observation Stay (HOSPITAL_COMMUNITY)
Admission: EM | Admit: 2021-10-29 | Discharge: 2021-10-30 | Disposition: A | Discharge status: Home / Self Care | Payer: Medicare HMO | Attending: Internal Medicine | Admitting: Internal Medicine

## 2021-10-29 ENCOUNTER — Emergency Department (HOSPITAL_COMMUNITY): Payer: Medicare HMO

## 2021-10-29 DIAGNOSIS — Z20822 Contact with and (suspected) exposure to covid-19: Secondary | ICD-10-CM | POA: Diagnosis not present

## 2021-10-29 DIAGNOSIS — I161 Hypertensive emergency: Secondary | ICD-10-CM

## 2021-10-29 DIAGNOSIS — I5022 Chronic systolic (congestive) heart failure: Secondary | ICD-10-CM | POA: Insufficient documentation

## 2021-10-29 DIAGNOSIS — I160 Hypertensive urgency: Secondary | ICD-10-CM | POA: Diagnosis not present

## 2021-10-29 DIAGNOSIS — R140 Abdominal distension (gaseous): Secondary | ICD-10-CM | POA: Diagnosis not present

## 2021-10-29 DIAGNOSIS — Z7901 Long term (current) use of anticoagulants: Secondary | ICD-10-CM

## 2021-10-29 DIAGNOSIS — Z743 Need for continuous supervision: Secondary | ICD-10-CM | POA: Diagnosis not present

## 2021-10-29 DIAGNOSIS — E872 Acidosis, unspecified: Secondary | ICD-10-CM

## 2021-10-29 DIAGNOSIS — I5023 Acute on chronic systolic (congestive) heart failure: Secondary | ICD-10-CM | POA: Diagnosis present

## 2021-10-29 DIAGNOSIS — I480 Paroxysmal atrial fibrillation: Secondary | ICD-10-CM | POA: Diagnosis present

## 2021-10-29 DIAGNOSIS — R7989 Other specified abnormal findings of blood chemistry: Secondary | ICD-10-CM | POA: Diagnosis present

## 2021-10-29 DIAGNOSIS — R0603 Acute respiratory distress: Secondary | ICD-10-CM | POA: Diagnosis present

## 2021-10-29 DIAGNOSIS — R110 Nausea: Secondary | ICD-10-CM | POA: Diagnosis not present

## 2021-10-29 DIAGNOSIS — Z79899 Other long term (current) drug therapy: Secondary | ICD-10-CM | POA: Insufficient documentation

## 2021-10-29 DIAGNOSIS — N186 End stage renal disease: Secondary | ICD-10-CM

## 2021-10-29 DIAGNOSIS — E8770 Fluid overload, unspecified: Principal | ICD-10-CM | POA: Diagnosis present

## 2021-10-29 DIAGNOSIS — I120 Hypertensive chronic kidney disease with stage 5 chronic kidney disease or end stage renal disease: Secondary | ICD-10-CM | POA: Diagnosis not present

## 2021-10-29 DIAGNOSIS — J9601 Acute respiratory failure with hypoxia: Secondary | ICD-10-CM | POA: Insufficient documentation

## 2021-10-29 DIAGNOSIS — R0602 Shortness of breath: Secondary | ICD-10-CM | POA: Diagnosis not present

## 2021-10-29 DIAGNOSIS — Z853 Personal history of malignant neoplasm of breast: Secondary | ICD-10-CM | POA: Diagnosis not present

## 2021-10-29 DIAGNOSIS — I132 Hypertensive heart and chronic kidney disease with heart failure and with stage 5 chronic kidney disease, or end stage renal disease: Secondary | ICD-10-CM | POA: Insufficient documentation

## 2021-10-29 DIAGNOSIS — R6889 Other general symptoms and signs: Secondary | ICD-10-CM | POA: Diagnosis not present

## 2021-10-29 DIAGNOSIS — I5020 Unspecified systolic (congestive) heart failure: Secondary | ICD-10-CM

## 2021-10-29 DIAGNOSIS — F17200 Nicotine dependence, unspecified, uncomplicated: Secondary | ICD-10-CM | POA: Diagnosis present

## 2021-10-29 DIAGNOSIS — Z87891 Personal history of nicotine dependence: Secondary | ICD-10-CM | POA: Diagnosis not present

## 2021-10-29 DIAGNOSIS — D72819 Decreased white blood cell count, unspecified: Secondary | ICD-10-CM | POA: Diagnosis not present

## 2021-10-29 DIAGNOSIS — R9431 Abnormal electrocardiogram [ECG] [EKG]: Secondary | ICD-10-CM | POA: Diagnosis not present

## 2021-10-29 DIAGNOSIS — R778 Other specified abnormalities of plasma proteins: Secondary | ICD-10-CM | POA: Insufficient documentation

## 2021-10-29 DIAGNOSIS — J449 Chronic obstructive pulmonary disease, unspecified: Secondary | ICD-10-CM | POA: Diagnosis not present

## 2021-10-29 DIAGNOSIS — Z992 Dependence on renal dialysis: Secondary | ICD-10-CM | POA: Insufficient documentation

## 2021-10-29 DIAGNOSIS — Z8673 Personal history of transient ischemic attack (TIA), and cerebral infarction without residual deficits: Secondary | ICD-10-CM | POA: Insufficient documentation

## 2021-10-29 DIAGNOSIS — I129 Hypertensive chronic kidney disease with stage 1 through stage 4 chronic kidney disease, or unspecified chronic kidney disease: Secondary | ICD-10-CM | POA: Diagnosis not present

## 2021-10-29 DIAGNOSIS — J810 Acute pulmonary edema: Secondary | ICD-10-CM | POA: Diagnosis not present

## 2021-10-29 LAB — CBC WITH DIFFERENTIAL/PLATELET
Abs Immature Granulocytes: 0.01 10*3/uL (ref 0.00–0.07)
Basophils Absolute: 0 10*3/uL (ref 0.0–0.1)
Basophils Relative: 1 %
Eosinophils Absolute: 0 10*3/uL (ref 0.0–0.5)
Eosinophils Relative: 1 %
HCT: 39.2 % (ref 36.0–46.0)
Hemoglobin: 12.3 g/dL (ref 12.0–15.0)
Immature Granulocytes: 0 %
Lymphocytes Relative: 16 %
Lymphs Abs: 0.6 10*3/uL — ABNORMAL LOW (ref 0.7–4.0)
MCH: 28.3 pg (ref 26.0–34.0)
MCHC: 31.4 g/dL (ref 30.0–36.0)
MCV: 90.1 fL (ref 80.0–100.0)
Monocytes Absolute: 0.3 10*3/uL (ref 0.1–1.0)
Monocytes Relative: 8 %
Neutro Abs: 2.9 10*3/uL (ref 1.7–7.7)
Neutrophils Relative %: 74 %
Platelets: 165 10*3/uL (ref 150–400)
RBC: 4.35 MIL/uL (ref 3.87–5.11)
RDW: 18.7 % — ABNORMAL HIGH (ref 11.5–15.5)
WBC: 3.9 10*3/uL — ABNORMAL LOW (ref 4.0–10.5)
nRBC: 0 % (ref 0.0–0.2)

## 2021-10-29 LAB — RESP PANEL BY RT-PCR (FLU A&B, COVID) ARPGX2
Influenza A by PCR: NEGATIVE
Influenza B by PCR: NEGATIVE
SARS Coronavirus 2 by RT PCR: NEGATIVE

## 2021-10-29 LAB — HEPATITIS B SURFACE ANTIGEN: Hepatitis B Surface Ag: NONREACTIVE

## 2021-10-29 LAB — BASIC METABOLIC PANEL
Anion gap: 20 — ABNORMAL HIGH (ref 5–15)
BUN: 89 mg/dL — ABNORMAL HIGH (ref 8–23)
CO2: 18 mmol/L — ABNORMAL LOW (ref 22–32)
Calcium: 10.3 mg/dL (ref 8.9–10.3)
Chloride: 102 mmol/L (ref 98–111)
Creatinine, Ser: 11.77 mg/dL — ABNORMAL HIGH (ref 0.44–1.00)
GFR, Estimated: 3 mL/min — ABNORMAL LOW (ref >60)
Glucose, Bld: 110 mg/dL — ABNORMAL HIGH (ref 70–99)
Potassium: 5.1 mmol/L (ref 3.5–5.1)
Sodium: 140 mmol/L (ref 135–145)

## 2021-10-29 LAB — MAGNESIUM: Magnesium: 2.6 mg/dL — ABNORMAL HIGH (ref 1.7–2.4)

## 2021-10-29 LAB — TROPONIN I (HIGH SENSITIVITY)
Troponin I (High Sensitivity): 271 ng/L (ref <18)
Troponin I (High Sensitivity): 268 ng/L (ref <18)

## 2021-10-29 LAB — I-STAT CHEM 8, ED
BUN: 71 mg/dL — ABNORMAL HIGH (ref 8–23)
Calcium, Ion: 1.07 mmol/L — ABNORMAL LOW (ref 1.15–1.40)
Chloride: 104 mmol/L (ref 98–111)
Creatinine, Ser: 12.4 mg/dL — ABNORMAL HIGH (ref 0.44–1.00)
Glucose, Bld: 108 mg/dL — ABNORMAL HIGH (ref 70–99)
HCT: 39 % (ref 36.0–46.0)
Hemoglobin: 13.3 g/dL (ref 12.0–15.0)
Potassium: 5 mmol/L (ref 3.5–5.1)
Sodium: 135 mmol/L (ref 135–145)
TCO2: 20 mmol/L — ABNORMAL LOW (ref 22–32)

## 2021-10-29 LAB — HEPATITIS B CORE ANTIBODY, TOTAL: Hep B Core Total Ab: NONREACTIVE

## 2021-10-29 LAB — HEPATITIS B SURFACE ANTIBODY,QUALITATIVE: Hep B S Ab: REACTIVE — AB

## 2021-10-29 LAB — HEPATITIS C ANTIBODY: HCV Ab: REACTIVE — AB

## 2021-10-29 LAB — BRAIN NATRIURETIC PEPTIDE: B Natriuretic Peptide: >4500 pg/mL — ABNORMAL HIGH (ref 0.0–100.0)

## 2021-10-29 LAB — CBG MONITORING, ED: Glucose-Capillary: 107 mg/dL — ABNORMAL HIGH (ref 70–99)

## 2021-10-29 LAB — PHOSPHORUS: Phosphorus: 7.6 mg/dL — ABNORMAL HIGH (ref 2.5–4.6)

## 2021-10-29 MED ORDER — HEPARIN SODIUM (PORCINE) 1000 UNIT/ML DIALYSIS: 1000.0000 [IU] | INTRAMUSCULAR | Status: DC | PRN

## 2021-10-29 MED ORDER — HYDRALAZINE HCL 20 MG/ML IJ SOLN: 10.0000 mg | INTRAMUSCULAR | Status: DC | PRN

## 2021-10-29 MED ORDER — APIXABAN 2.5 MG PO TABS
2.5000 mg | ORAL_TABLET | Freq: Two times a day (BID) | ORAL | Status: DC
  Administered 2021-10-29 – 2021-10-30 (×2): 2.5 mg via ORAL
  Filled 2021-10-29 (×2): qty 1

## 2021-10-29 MED ORDER — PREGABALIN 100 MG PO CAPS
100.0000 mg | ORAL_CAPSULE | Freq: Every day | ORAL | Status: DC
  Administered 2021-10-29: 100 mg via ORAL
  Filled 2021-10-29: qty 1

## 2021-10-29 MED ORDER — ANTICOAGULANT SODIUM CITRATE 4% (200MG/5ML) IV SOLN: 5.0000 mL | Status: DC | PRN

## 2021-10-29 MED ORDER — CLONIDINE HCL 0.1 MG/24HR TD PTWK: 0.1000 mg | MEDICATED_PATCH | TRANSDERMAL | Status: DC

## 2021-10-29 MED ORDER — PENTAFLUOROPROP-TETRAFLUOROETH EX AERO: 1.0000 "application " | INHALATION_SPRAY | CUTANEOUS | Status: DC | PRN

## 2021-10-29 MED ORDER — MIRTAZAPINE 7.5 MG PO TABS: 7.5000 mg | ORAL_TABLET | Freq: Every evening | ORAL | Status: DC | PRN

## 2021-10-29 MED ORDER — SODIUM CHLORIDE 0.9% FLUSH
3.0000 mL | Freq: Two times a day (BID) | INTRAVENOUS | Status: DC
  Administered 2021-10-29: 3 mL via INTRAVENOUS

## 2021-10-29 MED ORDER — CARVEDILOL 3.125 MG PO TABS
3.1250 mg | ORAL_TABLET | Freq: Two times a day (BID) | ORAL | Status: DC
  Administered 2021-10-29 – 2021-10-30 (×2): 3.125 mg via ORAL
  Filled 2021-10-29 (×3): qty 1

## 2021-10-29 MED ORDER — LOSARTAN POTASSIUM 50 MG PO TABS
100.0000 mg | ORAL_TABLET | Freq: Every day | ORAL | Status: DC
  Administered 2021-10-29 – 2021-10-30 (×2): 100 mg via ORAL
  Filled 2021-10-29 (×2): qty 2

## 2021-10-29 MED ORDER — LORATADINE 10 MG PO TABS: 10.0000 mg | ORAL_TABLET | Freq: Every day | ORAL | Status: DC | PRN

## 2021-10-29 MED ORDER — ALTEPLASE 2 MG IJ SOLR: 2.0000 mg | Freq: Once | INTRAMUSCULAR | Status: DC | PRN

## 2021-10-29 MED ORDER — HYDRALAZINE HCL 25 MG PO TABS
50.0000 mg | ORAL_TABLET | Freq: Three times a day (TID) | ORAL | Status: DC
  Administered 2021-10-29 – 2021-10-30 (×3): 50 mg via ORAL
  Filled 2021-10-29 (×4): qty 2

## 2021-10-29 MED ORDER — LABETALOL HCL 5 MG/ML IV SOLN
10.0000 mg | Freq: Once | INTRAVENOUS | Status: AC
  Administered 2021-10-29: 10 mg via INTRAVENOUS
  Filled 2021-10-29: qty 4

## 2021-10-29 MED ORDER — ISOSORBIDE MONONITRATE ER 30 MG PO TB24
30.0000 mg | ORAL_TABLET | Freq: Every day | ORAL | Status: DC
  Administered 2021-10-29 – 2021-10-30 (×2): 30 mg via ORAL
  Filled 2021-10-29 (×2): qty 1

## 2021-10-29 MED ORDER — NICARDIPINE HCL IN NACL 20-0.86 MG/200ML-% IV SOLN
3.0000 mg/h | INTRAVENOUS | Status: DC
  Administered 2021-10-29: 5 mg/h via INTRAVENOUS
  Filled 2021-10-29: qty 200

## 2021-10-29 MED ORDER — CHLORHEXIDINE GLUCONATE CLOTH 2 % EX PADS
6.0000 | MEDICATED_PAD | Freq: Every day | CUTANEOUS | Status: DC
  Administered 2021-10-30: 6 via TOPICAL

## 2021-10-29 MED ORDER — ALBUTEROL SULFATE (2.5 MG/3ML) 0.083% IN NEBU
2.5000 mg | INHALATION_SOLUTION | RESPIRATORY_TRACT | Status: DC | PRN
Start: 2021-10-29 — End: 2021-10-31

## 2021-10-29 MED ORDER — LIDOCAINE HCL (PF) 1 % IJ SOLN: 5.0000 mL | INTRAMUSCULAR | Status: DC | PRN

## 2021-10-29 MED ORDER — LOPERAMIDE HCL 2 MG PO CAPS
2.0000 mg | ORAL_CAPSULE | Freq: Once | ORAL | Status: AC
Start: 2021-10-29 — End: 2021-10-29
  Administered 2021-10-29: 2 mg via ORAL
  Filled 2021-10-29: qty 1

## 2021-10-29 MED ORDER — LIDOCAINE-PRILOCAINE 2.5-2.5 % EX CREA: 1.0000 "application " | TOPICAL_CREAM | CUTANEOUS | Status: DC | PRN

## 2021-10-29 MED ORDER — AMLODIPINE BESYLATE 10 MG PO TABS
10.0000 mg | ORAL_TABLET | Freq: Every day | ORAL | Status: DC
  Administered 2021-10-30: 10 mg via ORAL
  Filled 2021-10-29: qty 1

## 2021-10-29 NOTE — Progress Notes (Signed)
Patient refused skin assessment.Charge nurse aware.

## 2021-10-29 NOTE — ED Provider Notes (Signed)
[facility] EMERGENCY DEPARTMENT Provider Note   CSN: [id number] Arrival date & time: 10/29/21  0827     History PMH: HTN, ESRD on HD TThS, Aortic Dissection s/p repair, Atrial fibrillation on Eliquis, COPD, hx CVA, DCIS s/p lumpectomy, tobacco use, GERD Chief Complaint  Patient presents with   Shortness of Breath    Monique Henderson is a 71 y.o. female.  Presents with a chief complaint of shortness of breath.  She states since Monday (2 days ago) she has had gradually worsening shortness of breath that is worse when laying flat.  She says she had an associated worsening cough with white sputum production.  She is a dialysis patient and missed her session yesterday.  She often misses dialysis sessions.  She does state that she still makes urine.  She states she has been compliant with all of her medications including her blood pressure medications and blood thinners. She was recently admitted to the hospital on April 16-17th for shortness of breath related to missing dialysis sessions and hypertension. Denies Fevers, chills, chest pain, headaches, numbness, weakness, leg swelling.    Shortness of Breath Associated symptoms: cough   Associated symptoms: no abdominal pain, no chest pain, no fever, no sore throat, no vomiting and no wheezing       Home Medications Prior to Admission medications   Medication Sig Start Date End Date Taking? Authorizing Provider  amLODipine (NORVASC) 10 MG tablet Take 1 tablet (10 mg total) by mouth daily. 02/14/21  Yes Maximiano Coss, NP  apixaban (ELIQUIS) 2.5 MG TABS tablet Take 1 tablet (2.5 mg total) by mouth 2 (two) times daily. 08/24/21  Yes Paige, Weldon Picking, DO  carvedilol (COREG) 3.125 MG tablet Take 1 tablet (3.125 mg total) by mouth 2 (two) times daily with a meal. 08/24/21  Yes Paige, Victoria J, DO  cetirizine (ZYRTEC) 10 MG tablet Take 10 mg by mouth daily as needed for allergies.   Yes [provider]   ibuprofen (ADVIL) 200 MG tablet Take 600 mg by mouth every 6 (six) hours as needed for headache or moderate pain.   Yes [provider]  isosorbide mononitrate (IMDUR) 30 MG 24 hr tablet Take 1 tablet (30 mg total) by mouth daily. 08/17/21  Yes Paige, Victoria J, DO  losartan (COZAAR) 100 MG tablet Take 1 tablet (100 mg total) by mouth daily. 02/14/21  Yes Maximiano Coss, NP  pregabalin (LYRICA) 50 MG capsule Take 100 mg by mouth at bedtime. 05/07/21  Yes [provider]  albuterol (VENTOLIN HFA) 108 (90 Base) MCG/ACT inhaler Inhale 2 puffs into the lungs every 6 (six) hours as needed for wheezing or shortness of breath. 07/30/20   Maximiano Coss, NP  cloNIDine (CATAPRES - DOSED IN MG/24 HR) 0.1 mg/24hr patch Place 1 patch (0.1 mg total) onto the skin every Saturday. 06/14/21   Caren Griffins, MD  hydrALAZINE (APRESOLINE) 50 MG tablet Take 1 tablet (50 mg total) by mouth 3 (three) times daily. 06/21/21 09/15/21  Nolberto Hanlon, MD  loperamide (IMODIUM) 2 MG capsule Take 2 mg by mouth as needed for diarrhea or loose stools. 07/15/20   [provider]  mirtazapine (REMERON) 7.5 MG tablet Take 1 tablet (7.5 mg total) by mouth at bedtime. Patient taking differently: Take 7.5 mg by mouth at bedtime as needed (sleep). 07/15/20   Maximiano Coss, NP  [facility] 4 MG/0.1ML LIQD nasal spray kit Place 1 spray into the nose as needed for opioid reversal. If no  response within 3 minutes then repeat once in the other nostril.  Call 911. 05/15/20   [provider]  rosuvastatin (CRESTOR) 10 MG tablet Take 1 tablet (10 mg total) by mouth daily. 02/14/21 09/15/21  Maximiano Coss, NP  sevelamer carbonate (RENVELA) 2.4 g PACK Take 2.4 g by mouth 3 (three) times daily with meals. 05/15/21   [provider]  VITAMIN D PO Take 5,000 Units by mouth daily.    [provider]      Allergies    Shrimp [shellfish allergy], Bactroban [mupirocin], Vicodin [hydrocodone-acetaminophen],  Eliquis [apixaban], Lisinopril, and Tylenol [acetaminophen]    Review of Systems   Review of Systems  Constitutional:  Negative for chills and fever.  HENT:  Negative for congestion and sore throat.   Eyes:  Negative for visual disturbance.  Respiratory:  Positive for cough and shortness of breath. Negative for chest tightness and wheezing.   Cardiovascular:  Negative for chest pain and leg swelling.  Gastrointestinal:  Negative for abdominal pain, nausea and vomiting.  Genitourinary:  Negative for decreased urine volume.  Neurological:  Negative for weakness.   Physical Exam Updated Vital Signs BP 140/83   Pulse 74   Temp 97.9 F (36.6 C) (Oral)   Resp 19   Ht $R'5\' 3"'Js$  (1.6 m)   Wt 45 kg   SpO2 100%   BMI 17.57 kg/m  Physical Exam Vitals and nursing note reviewed.  Constitutional:      General: She is not in acute distress.    Appearance: Normal appearance. She is ill-appearing. She is not toxic-appearing or diaphoretic.     Comments: Chronically ill appearing  HENT:     Head: Normocephalic and atraumatic.     Nose: No nasal deformity.     Mouth/Throat:     Lips: Pink. No lesions.     Mouth: No injury, lacerations, oral lesions or angioedema.     Pharynx: Uvula midline. No posterior oropharyngeal erythema or uvula swelling.  Eyes:     General: Gaze aligned appropriately. No scleral icterus.       Right eye: No discharge.        Left eye: No discharge.     Conjunctiva/sclera: Conjunctivae normal.     Right eye: Right conjunctiva is not injected. No exudate or hemorrhage.    Left eye: Left conjunctiva is not injected. No exudate or hemorrhage. Neck:     Vascular: No JVD.     Trachea: No tracheal deviation.  Cardiovascular:     Rate and Rhythm: Normal rate and regular rhythm.     Pulses: Normal pulses.          Radial pulses are 2+ on the right side and 2+ on the left side.       Dorsalis pedis pulses are 2+ on the right side and 2+ on the left side.     Heart sounds:  Normal heart sounds, S1 normal and S2 normal. Heart sounds not distant. No murmur heard.   No friction rub. No gallop. No S3 or S4 sounds.  Pulmonary:     Effort: Pulmonary effort is normal. No accessory muscle usage or respiratory distress.     Breath sounds: Normal breath sounds. No stridor. No wheezing, rhonchi or rales.  Chest:     Chest wall: No tenderness.  Abdominal:     General: Abdomen is flat. Bowel sounds are normal. There is no distension.     Palpations: Abdomen is soft. There is no mass or pulsatile  mass.     Tenderness: There is no abdominal tenderness. There is no guarding or rebound.  Musculoskeletal:     Right lower leg: No tenderness. No edema.     Left lower leg: No tenderness. No edema.  Skin:    General: Skin is warm and dry.     Coloration: Skin is not jaundiced or pale.     Findings: No bruising, erythema, lesion or rash.  Neurological:     General: No focal deficit present.     Mental Status: She is alert and oriented to person, place, and time.     GCS: GCS eye subscore is 4. GCS verbal subscore is 5. GCS motor subscore is 6.  Psychiatric:        Mood and Affect: Mood normal.        Behavior: Behavior normal. Behavior is cooperative.    ED Results / Procedures / Treatments   Labs (all labs ordered are listed, but only abnormal results are displayed) Labs Reviewed  BASIC METABOLIC PANEL - Abnormal; Notable for the following components:      Result Value   CO2 18 (*)    Glucose, Bld 110 (*)    BUN 89 (*)    Creatinine, Ser 11.77 (*)    GFR, Estimated 3 (*)    Anion gap 20 (*)    All other components within normal limits  CBC WITH DIFFERENTIAL/PLATELET - Abnormal; Notable for the following components:   WBC 3.9 (*)    RDW 18.7 (*)    Lymphs Abs 0.6 (*)    All other components within normal limits  BRAIN NATRIURETIC PEPTIDE - Abnormal; Notable for the following components:   B Natriuretic Peptide >4,500.0 (*)    All other components within normal  limits  CBG MONITORING, ED - Abnormal; Notable for the following components:   Glucose-Capillary 107 (*)    All other components within normal limits  I-STAT CHEM 8, ED - Abnormal; Notable for the following components:   BUN 71 (*)    Creatinine, Ser 12.40 (*)    Glucose, Bld 108 (*)    Calcium, Ion 1.07 (*)    TCO2 20 (*)    All other components within normal limits  TROPONIN I (HIGH SENSITIVITY) - Abnormal; Notable for the following components:   Troponin I (High Sensitivity) 271 (*)    All other components within normal limits  RESP PANEL BY RT-PCR (FLU A&B, COVID) ARPGX2  URINALYSIS, ROUTINE W REFLEX MICROSCOPIC  MAGNESIUM  PHOSPHORUS  TROPONIN I (HIGH SENSITIVITY)    EKG EKG Interpretation  Date/Time:  Wednesday Oct 29 2021 08:30:24 EDT Ventricular Rate:  97 PR Interval:  156 QRS Duration: 143 QT Interval:  411 QTC Calculation: 523 R Axis:   83 Text Interpretation: Sinus rhythm Biatrial enlargement No sig change from prior tracing April 2023, no STEMI Confirmed by Octaviano Glow [phone]) on 10/29/2021 8:46:01 AM  Radiology DG Chest 2 View  Result Date: 10/29/2021 CLINICAL DATA:  Shortness of breath.  Missed dialysis EXAM: CHEST - 2 VIEW COMPARISON:  09/14/2021 FINDINGS: Right-sided hemodialysis catheter in stable positioning. Stable cardiomegaly. Prior median sternotomy. Aortic atherosclerosis. Mildly increased bibasilar interstitial markings. No pleural effusion. No pneumothorax. IMPRESSION: Mildly increased bibasilar interstitial markings, could reflect mild edema. Electronically Signed   By: Davina Poke D.O.   On: 10/29/2021 09:09    Procedures .Critical Care Performed by: Adolphus Birchwood, PA-C Authorized by: Adolphus Birchwood, PA-C   Critical care provider statement:  Critical care time (minutes):  75   Critical care time was exclusive of:  Separately billable procedures and treating other patients   Critical care was necessary to treat or prevent  imminent or life-threatening deterioration of the following conditions:  Respiratory failure, circulatory failure and renal failure (requirement of O2, emergent dialysis need, hypertensive emergency requiring gtt)   Critical care was time spent personally by me on the following activities:  Blood draw for specimens, development of treatment plan with patient or surrogate, discussions with consultants, discussions with primary provider, evaluation of patient's response to treatment, examination of patient, obtaining history from patient or surrogate, review of old charts, re-evaluation of patient's condition, pulse oximetry, ordering and review of radiographic studies, ordering and review of laboratory studies and ordering and performing treatments and interventions   Care discussed with: admitting provider    This patient was on telemetry or cardiac monitoring during their time in the ED.    Medications Ordered in ED Medications  labetalol (NORMODYNE) injection 10 mg (10 mg Intravenous Given 10/29/21 1696)    ED Course/ Medical Decision Making/ A&P Clinical Course as of 10/29/21 1115  Wed Oct 29, 2021  0940 71 yo female w/ ESRD on dialysis, missed her session yesterday, presenting to ED with SOB and HTN.  Anion gap of 20, K 5.1 here, trop persistently elevated and today 271.  Prior admissions for hypertensive emergency and SOB - likely the recurring issue, will start on BP infusion, consult nephrology for dialysis, and admission. [MT]  1038 BP normalized on low dose infusion nicardipene - she is breathing comfortably at this time. [MT]    Clinical Course User Index [MT] Trifan, Carola Rhine, MD                           Medical Decision Making Amount and/or Complexity of Data Reviewed Labs: ordered. Radiology: ordered.  Risk Prescription drug management.    MDM  This is a 71 y.o. female who presents to the ED with shortness of breath  The differential of this patient includes but is not  limited to PNA, bronchitis, pulmonary edema, dialysis need, PE, COPD exasperation, CHF, ACS, PTX   My Impression, Plan, and ED Course:  Presents with shortness of breath in the setting of missing dialysis yesterday.  She last had dialysis on Saturday.  She has historically been very noncompliant with her dialysis and is required multiple emergent dialysis treatments in the hospital.  She was released recently admitted for April 16 for both dialysis needs and hypertension. She is hypertensive here today with blood pressure of 211/138.  She is requiring 3 L O2. She is afebrile.  She is oxygenating at 100% on 3 L she reportedly does not use at home, however she does have a hx of COPD. She does report a new productive cough so PNA and bronchitis are possibilities.  Exam not notable for significant hypervolemia. No wheezing or course lung sounds. She is on chronic anticoagulation, Wells 0 and I have low suspicion for PE.  Shortness of breath seems likely to be due to missing dialysis. Presentation is consistent with several prior admissions and visits for same thing.  I personally ordered, reviewed, and interpreted all laboratory work and imaging and agree with radiologist interpretation. Results interpreted below:  EKG without change from baseline. Chest X-ray reveals signs suggestive of mild pulmonary edema worsening from baseline. No evidence of PNA.  CBC shows mild leukopenia with white count  of 3.9.  This is nonspecific.  Doubt this reflects infectious process.  Stable from baseline. BMP revealed an acidosis.  CO2 was 18 and anion gap is 20.  She has a glucose of 110.  Creatinine is 11.77 which is up from 6.44.  Potassium and sodium are normal range Mag and Phos are pending Troponin is 271 which is up from her normal baseline of 115 one month ago. COVID/Flu negative. UA pending  Patient's work-up shows signs that she will need dialysis.  She also has evidence of hypertensive emergency with  elevated troponins.  Labetalol did not sufficiently decrease her blood pressure, so we have placed her on a nicardipine drip. She responded well to low dose of Nicardipine and pressures have been maintained around 140/83. We have weaned the drip off. She can probably be maintained on PRN medications now. She is also requiring 3 L of O2 likely 2/2 to pulmonary edema. She will need to be admitted.  @0952 , I have discussed case with Dr. Jonnie Finner with Nephrology who will see patient for evaluation of dialysis.   @1111 , I have discussed case with Dr. [name] from [facility]. He agrees to accept patient.     Charting Requirements Additional history is obtained from:  Independent historian External Records from outside source obtained and reviewed including: Reviewed prior labs, recent discharge summary from April 2022, reviewed prior EKG.  Social Determinants of Health:  none Pertinant PMH that complicates patient's illness: ESRD, HTN, Atrial fibrillation on thinners  Patient Care Problems that were addressed during this visit: - Hypertensive Emergency: Chronic illness with exacerbation, progression, or side effects of treatment - ESRD need for dialysis: Chronic illness with threat to life or bodily function and Chronic illness with exacerbation, progression, or side effects of treatment - Acute Hypoxic Respiratory Failure: Acute illness with systemic symptoms and Acute illness with threat to life or bodily functions This patient was maintained on a cardiac monitor/telemetry. I personally viewed and interpreted the cardiac monitor which reveals an underlying rhythm of NSR Medications given in ED: Labetalol, Nicardipine gtt Reevaluation of the patient after these medicines showed that the patient improved I have reviewed home medications.  Critical Care Interventions:  - Oxygen use for hypoxic resp failure, Started Nicardipine gtt for hypertensive emergency, urgent dialysis  need Consultations: [facility], Nephrology. See details in note.  Disposition: Admit  This is a shared visit with my attending physician, Dr. Langston Masker.  We have discussed this patient and they have independently evaluated this patient. The plan was altered or changed as needed.  Portions of this note were generated with [employer]. Dictation errors may occur despite best attempts at proofreading.    Final Clinical Impression(s) / ED Diagnoses Final diagnoses:  Hypertensive emergency  End-stage renal disease needing dialysis ([place])  Acute respiratory failure with hypoxia [facility])    Rx / DC Orders ED Discharge Orders     None         Adolphus Birchwood, PA-C 10/29/21 1115    Wyvonnia Dusky, MD 10/29/21 1355

## 2021-10-29 NOTE — Consult Note (Signed)
Renal Service Consult Note [facility]  Monique Henderson 10/29/2021 Sol Blazing, MD Requesting Physician: Dr. Harvest Forest  Reason for Consult: ESRD pt w/ SOB HPI: The patient is a 71 y.o. year-old w/ hx of atrial fib, aortic dissection (sp repair), h/o breast cancer, CHF EF 40-45%, COPD, ESRD on HD, HTN, hep C, anemia who presented to ED w/ SOB this am. SOB is worse lying flat. She is on HD and has a hx of missing HD sessions. She missed her HD yesterday. In ED, K 5.0, BUN 71, creat 12.4, WBC 3.9, Hb 12. BP 211/ 138 ---> 160/118, 3L [name] O2 and CXR showed mild IS edema. Pt missed HD yesterday. Pt is admitted for resp failure, HTNsive urgency, ^troponin. We are asked to see for HD.   Pt seen in ED.  She is feeling better her regarding SOB. She is somnolent and doesn't answer all questions, but will follow simple commands.    ROS - denies CP, no joint pain, no HA, no blurry vision, no rash, no diarrhea, no nausea/ vomiting, no dysuria, no difficulty voiding   Past Medical History  Past Medical History:  Diagnosis Date   AF (paroxysmal atrial fibrillation) ([place]) 05/29/2019   on Coumadin   Aortic atherosclerosis ([place]) 07/05/2019   Aortic dissection ([place]) 04/04/2019   s/p repair   Bone spur 2008   Right calcaneal foot spur   Breast cancer ([place]) 2004   Ductal carcinoma in situ of the left breast; S/P left partial mastectomy 02/26/2003; S/P re-excision of cranial and lateral margins11/18/2004.radiation   Cerebral thrombosis with cerebral infarction 05/22/2019   Chronic low back pain 06/22/2016   Chronic obstructive lung disease ([place]) 01/16/2017   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 Autumn Messing; S/P re-excision of superior and inferior margins 10/27/2012.    ESRD on hemodialysis ([place]) 05/29/2019   Essential hypertension 09/16/2006   GERD 09/16/2006   Hepatitis C    treated and RNA confirmed not detectable  01/2017   Insomnia 03/14/2015   Malnutrition of moderate degree 05/19/2019   Non compliance with medical treatment 12/04/2017   Normocytic anemia    With thrombocytosis   Osteoarthritis    Right ureteral stone 2002   S/P lumbar spinal fusion 01/18/2014   S/P lumbar decompressive laminectomy, fusion, and plating for lumbar spinal stenosis on 05/27/2009 by Dr. Eustace Moore.  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 Eustace Moore.     Tobacco use disorder 04/19/2009   Uterine fibroid    Wears dentures    top   Past Surgical History  Past Surgical History:  Procedure Laterality Date   ANTERIOR LAT LUMBAR FUSION N/A 01/18/2014   Procedure: ANTERIOR LATERAL LUMBAR FUSION LUMBAR TWO-THREE;  Surgeon: Eustace Moore, MD;  Location: [place] [facility];  Service: Neurosurgery;  Laterality: N/A;   ANTERIOR LUMBAR FUSION  01/18/2014   AV FISTULA PLACEMENT Left 04/20/2019   Procedure: ARTERIOVENOUS (AV) FISTULA CREATION;  Surgeon: [name], MD;  Location: [place];  Service: Vascular;  Laterality: Left;   BACK SURGERY     BREAST LUMPECTOMY Left 01/2003   BREAST LUMPECTOMY Right 2014   BREAST LUMPECTOMY WITH NEEDLE LOCALIZATION AND AXILLARY SENTINEL LYMPH NODE BX Right 10/13/2012   Procedure: BREAST LUMPECTOMY WITH  NEEDLE LOCALIZATION;  Surgeon: [name], MD;  Location: [place];  Service: General;  Laterality: Right;  Right breast wire localized lumpectomy   INSERTION OF DIALYSIS CATHETER Right 04/20/2019   Procedure: INSERTION OF DIALYSIS CATHETER, right internal jugular;  Surgeon: [name], MD;  Location: [place];  Service: Vascular;  Laterality: Right;   INSERTION OF DIALYSIS CATHETER Right 09/24/2020   Procedure: INSERTION OF TUNNELED DIALYSIS CATHETER;   Surgeon: Rosetta Posner, MD;  Location: [place];  Service: Vascular;  Laterality: Right;   IR FLUORO GUIDE CV LINE RIGHT  09/22/2020   IR THORACENTESIS ASP PLEURAL SPACE W/IMG GUIDE  05/19/2019   IR US GUIDE VASC ACCESS LEFT  09/22/2020   IR US GUIDE VASC ACCESS RIGHT  09/22/2020   IR VENOCAVAGRAM SVC  09/22/2020   LAMINECTOMY  05/27/2009   Lumbar decompressive laminectomy, fusion and plating for lumbar spinal stensosis   LIGATION OF ARTERIOVENOUS  FISTULA Left 09/24/2020   Procedure: LIGATION OF LEFT ARM ARTERIOVENOUS  FISTULA;  Surgeon: Rosetta Posner, MD;  Location: [place];  Service: Vascular;  Laterality: Left;   LUMBAR LAMINECTOMY/DECOMPRESSION MICRODISCECTOMY Left 03/23/2013   Procedure: LUMBAR LAMINECTOMY/DECOMPRESSION MICRODISCECTOMY 1 LEVEL;  Surgeon: Eustace Moore, MD;  Location: [facility];  Service: Neurosurgery;  Laterality: Left;  LUMBAR LAMINECTOMY/DECOMPRESSION MICRODISCECTOMY 1 LEVEL   MASTECTOMY, PARTIAL Left 02/26/2003   ; S/P re-excision of cranial and lateral margins 04/19/2003.    RE-EXCISION OF BREAST CANCER,SUPERIOR MARGINS Right 10/27/2012   Procedure: RE-EXCISION OF BREAST CANCER,SUPERIOR and inferior MARGINS;  Surgeon: [name], MD;  Location: [place];  Service: General;  Laterality: Right;   RE-EXCISION OF BREAST LUMPECTOMY Left 04/2003   TEE WITHOUT CARDIOVERSION N/A 04/04/2019   Procedure: Transesophageal Echocardiogram ([place]);  Surgeon: Wonda Olds, MD;  Location: [place];  Service: Open Heart Surgery;  Laterality: N/A;   THORACIC AORTIC ANEURYSM REPAIR N/A 04/04/2019   Procedure: THORACIC ASCENDING ANEURYSM REPAIR (AAA)  USING 28 MM X 30 CM HEMASHIELD PLATINUM VASCULAR GRAFT;  Surgeon: Wonda Olds, MD;  Location: [facility];  Service: Open Heart Surgery;  Laterality: N/A;   Family History  Family History  Problem Relation Age of Onset   Colon cancer Mother 15   Hypertension Mother    Diabetes Sister 72   Hypertension Sister    Diabetes Brother     Hypertension Brother    Diabetes Brother    Hypertension Brother    Kidney disease Son        s/p renal transplant   Hypertension Son    Diabetes Son    Multiple sclerosis Son    Bone cancer Sister 68   Breast cancer Neg Hx    Cervical cancer Neg Hx    Social History  reports that she quit smoking about 2 years ago. Her smoking use included cigarettes. She has a 11.00 pack-year smoking history. She has never used smokeless tobacco. She reports that she does not currently use alcohol after a past usage of about 2.0 standard drinks per week. She reports that she does not currently use drugs after having used the following drugs: Cocaine. Allergies  Allergies  Allergen Reactions   Shrimp [Shellfish Allergy] Shortness Of Breath   Bactroban [Mupirocin] Other (See Comments)    "Sores in nose"   Vicodin [Hydrocodone-Acetaminophen] Itching and Nausea And Vomiting    This is patient's home medication   Eliquis [Apixaban] Itching    Spoke with patient, no  rash.  Has tolerated on re-challenge.   Lisinopril Cough   Tylenol [Acetaminophen] Itching   Home medications Prior to Admission medications   Medication Sig Start Date End Date Taking? Authorizing Provider  amLODipine (NORVASC) 10 MG tablet Take 1 tablet (10 mg total) by mouth daily. 02/14/21  Yes Maximiano Coss, NP  apixaban (ELIQUIS) 2.5 MG TABS tablet Take 1 tablet (2.5 mg total) by mouth 2 (two) times daily. 08/24/21  Yes Paige, Weldon Picking, DO  carvedilol (COREG) 3.125 MG tablet Take 1 tablet (3.125 mg total) by mouth 2 (two) times daily with a meal. 08/24/21  Yes Paige, Victoria J, DO  cetirizine (ZYRTEC) 10 MG tablet Take 10 mg by mouth daily as needed for allergies.   Yes [provider]  hydrALAZINE (APRESOLINE) 50 MG tablet Take 1 tablet (50 mg total) by mouth 3 (three) times daily. 06/21/21 10/29/21 Yes Nolberto Hanlon, MD  ibuprofen (ADVIL) 200 MG tablet Take 600 mg by mouth every 6 (six) hours as needed for headache or  moderate pain.   Yes [provider]  isosorbide mononitrate (IMDUR) 30 MG 24 hr tablet Take 1 tablet (30 mg total) by mouth daily. 08/17/21  Yes Paige, Weldon Picking, DO  loperamide (IMODIUM) 2 MG capsule Take 2 mg by mouth as needed for diarrhea or loose stools. 07/15/20  Yes [provider]  losartan (COZAAR) 100 MG tablet Take 1 tablet (100 mg total) by mouth daily. 02/14/21  Yes Maximiano Coss, NP  mirtazapine (REMERON) 7.5 MG tablet Take 1 tablet (7.5 mg total) by mouth at bedtime. Patient taking differently: Take 7.5 mg by mouth at bedtime as needed (sleep). 07/15/20  Yes Maximiano Coss, NP  [facility] 4 MG/0.1ML LIQD nasal spray kit Place 1 spray into the nose as needed for opioid reversal. If no response within 3 minutes then repeat once in the other nostril.  Call 911. 05/15/20  Yes [provider]  pregabalin (LYRICA) 50 MG capsule Take 100 mg by mouth at bedtime. 05/07/21  Yes [provider]  sucroferric oxyhydroxide (VELPHORO) 500 MG chewable tablet Chew 1,000 mg by mouth 3 (three) times daily with meals.   Yes [provider]  torsemide (DEMADEX) 20 MG tablet Take 40 mg by mouth daily.   Yes [provider]  VITAMIN D PO Take 5,000 Units by mouth daily.   Yes [provider]  albuterol (VENTOLIN HFA) 108 (90 Base) MCG/ACT inhaler Inhale 2 puffs into the lungs every 6 (six) hours as needed for wheezing or shortness of breath. Patient not taking: Reported on 10/29/2021 07/30/20   Maximiano Coss, NP  cloNIDine (CATAPRES - DOSED IN MG/24 HR) 0.1 mg/24hr patch Place 1 patch (0.1 mg total) onto the skin every Saturday. Patient not taking: Reported on 10/29/2021 06/14/21   Caren Griffins, MD  rosuvastatin (CRESTOR) 10 MG tablet Take 1 tablet (10 mg total) by mouth daily. Patient not taking: Reported on 10/29/2021 02/14/21 09/15/21  Maximiano Coss, NP  sevelamer carbonate (RENVELA) 2.4 g PACK Take 2.4 g by mouth 3 (three) times daily with  meals. Patient not taking: Reported on 10/29/2021 05/15/21   [provider]     Vitals:   10/29/21 1330 10/29/21 1345 10/29/21 1400 10/29/21 1429  BP: (!) 154/93 (!) 145/119 (!) 168/118 (!) 164/120  Pulse:  84 (!) 115 95  Resp: 10 (!) 22 (!) 21 (!) 22  Temp:      TempSrc:      SpO2: 100% 100% 100% 100%  Weight:      Height:       Exam Gen eldelry AAF, lying flat, 3L [place], sleeping Awakens and follows simple commands, no distress No rash, cyanosis or gangrene Sclera anicteric, throat clear  No jvd or bruits Chest coarse rales/ rhonchi bilat bases, no wheezing, no ^wob RRR no RG Abd soft ntnd no mass or ascites +bs GU defer MS no joint effusions or deformity Ext no LE or UE edema, no wounds or ulcers Neuro as above, nonfocal, drowsy    RIJ TDC +intact      Home meds include - amlodipine, apixaban, carvedilol, cetirizine, hydralazine 50 tid, advil, isosorbide mononitrate 30, loperamide, losartan 100, mirtazapine, nracan, pregabalin 100hs, velphoro 1 gm ac tid, torsemide 40, vit D, albuterol, clonidine patch 0.1 weekly, rosuvastatin, sevelamer 2.4gm tid ac    CXR - mild early IS edema   OP HD: TTS [place]  3h 70mn  400/1.5  43.5kg   2/2 bath P2   TDC   Heparin none - hectorol 3 ug IV tiw - last HD 5/27, 47.8 > 46.1kg, coming off 2-3 kg over - last Hb 12.4 on 5/25  Assessment/ Plan: AHRF - secondary to fluid overload and missed dialysis. Stable in ED on 3L [place]. Have ordered HD for today/ tonight, max UF goal.  ESRD - on HD TTS.  Missed OP HD yesterday. HD as above.  HTNsive urgency - cont home meds, get volume down w HD PAF- on eliquis, BB Anemia esrd - Hb 12-13, no esa needs MBD ckd - Ca borderline high, will hold vdra for now. Get phos as add on.       RKelly Splinter MD 10/29/2021, 3:21 PM Recent Labs  Lab 10/29/21 0840 10/29/21 0857 10/29/21 1042  HGB 12.3 13.3  --   CALCIUM 10.3  --   --   PHOS  --   --  7.6*  CREATININE 11.77* 12.40*  --   K  5.1 5.0  --

## 2021-10-29 NOTE — ED Triage Notes (Signed)
Pt arrives via EMS from home with SOB. Pt reports missing her dialysis yesterday due to coughing too much. EMS reports O2 88% on RA. Pt reports her abd hurts from swelling.

## 2021-10-29 NOTE — H&P (Addendum)
History and Physical    Patient: Monique Henderson DOB: September 19, 1950 DOA: 10/29/2021 DOS: the patient was seen and examined on 10/29/2021 PCP: Pcp, No  Patient coming from: Home via EMS  Chief Complaint:  Chief Complaint  Patient presents with   Shortness of Breath   HPI: Monique Henderson is a 71 y.o. female with medical history significant of HTN, PAF on anticoagulation, heart failure with reduced EF, ESRD on HD TTS, hepatitis C, and aortic dissection s/p repair presents with complaints of shortness of breath.  She reports that she had been coughing overnight 2 days ago.  Patient had not gone to hemodialysis due to the coughing.  The cough was noted to be productive.  Patient reported associated symptoms of abdominal discomfort and swelling.  She had just recently been hospitalized 4/16-4/17 for acute respiratory failure with hypertensive urgency secondary to missed dialysis session.  Denies having any significant fevers, chest pain, nausea, vomiting, leg swelling, or calf pain.  In route with EMS patient was noted to have O2 saturations as low as 88% on room air.  In the emergency department patient was noted to be afebrile with respiration 14-32, blood pressures elevated up to 211/138, and O2 saturation currently maintained on 3 L nasal cannula oxygen.  Labs significant for WBC 3.9, CO2 18, BUN 71, creatinine 12.4, anion gap 20  Review of Systems: As mentioned in the history of present illness. All other systems reviewed and are negative. Past Medical History:  Diagnosis Date   AF (paroxysmal atrial fibrillation) ([place]) 05/29/2019   on Coumadin   Aortic atherosclerosis ([place]) 07/05/2019   Aortic dissection ([place]) 04/04/2019   s/p repair   Bone spur 2008   Right calcaneal foot spur   Breast cancer ([place]) 2004   Ductal carcinoma in situ of the left breast; S/P left partial mastectomy 02/26/2003; S/P re-excision of cranial and lateral margins11/18/2004.radiation   Cerebral  thrombosis with cerebral infarction 05/22/2019   Chronic low back pain 06/22/2016   Chronic obstructive lung disease ([place]) 01/16/2017   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 Autumn Messing; S/P re-excision of superior and inferior margins 10/27/2012.    ESRD on hemodialysis ([place]) 05/29/2019   Essential hypertension 09/16/2006   GERD 09/16/2006   Hepatitis C    treated and RNA confirmed not detectable 01/2017   Insomnia 03/14/2015   Malnutrition of moderate degree 05/19/2019   Non compliance with medical treatment 12/04/2017   Normocytic anemia    With thrombocytosis   Osteoarthritis    Right ureteral stone 2002   S/P lumbar spinal fusion 01/18/2014   S/P lumbar decompressive laminectomy, fusion, and plating for lumbar spinal stenosis on 05/27/2009 by Dr. Eustace Moore.  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 Eustace Moore.     Tobacco use disorder 04/19/2009   Uterine fibroid    Wears dentures    top   Past Surgical History:  Procedure Laterality Date   ANTERIOR LAT LUMBAR FUSION N/A 01/18/2014   Procedure: ANTERIOR LATERAL LUMBAR FUSION LUMBAR TWO-THREE;  Surgeon: Eustace Moore, MD;  Location: [facility];  Service: Neurosurgery;  Laterality: N/A;   ANTERIOR LUMBAR FUSION  01/18/2014   AV FISTULA PLACEMENT Left 04/20/2019   Procedure: ARTERIOVENOUS (AV) FISTULA CREATION;  Surgeon: Donzetta Matters,  Georgia Dom, MD;  Location: [facility];  Service: Vascular;  Laterality: Left;   BACK SURGERY     BREAST LUMPECTOMY Left 01/2003   BREAST LUMPECTOMY Right 2014   BREAST LUMPECTOMY WITH NEEDLE LOCALIZATION AND AXILLARY SENTINEL LYMPH NODE BX Right 10/13/2012   Procedure: BREAST LUMPECTOMY WITH NEEDLE LOCALIZATION;  Surgeon: [name], MD;   Location: [facility];  Service: General;  Laterality: Right;  Right breast wire localized lumpectomy   INSERTION OF DIALYSIS CATHETER Right 04/20/2019   Procedure: INSERTION OF DIALYSIS CATHETER, right internal jugular;  Surgeon: [name], MD;  Location: [facility];  Service: Vascular;  Laterality: Right;   INSERTION OF DIALYSIS CATHETER Right 09/24/2020   Procedure: INSERTION OF TUNNELED DIALYSIS CATHETER;  Surgeon: Rosetta Posner, MD;  Location: [facility];  Service: Vascular;  Laterality: Right;   IR FLUORO GUIDE CV LINE RIGHT  09/22/2020   IR THORACENTESIS ASP PLEURAL SPACE W/IMG GUIDE  05/19/2019   IR US GUIDE VASC ACCESS LEFT  09/22/2020   IR US GUIDE VASC ACCESS RIGHT  09/22/2020   IR VENOCAVAGRAM SVC  09/22/2020   LAMINECTOMY  05/27/2009   Lumbar decompressive laminectomy, fusion and plating for lumbar spinal stensosis   LIGATION OF ARTERIOVENOUS  FISTULA Left 09/24/2020   Procedure: LIGATION OF LEFT ARM ARTERIOVENOUS  FISTULA;  Surgeon: Rosetta Posner, MD;  Location: [facility];  Service: Vascular;  Laterality: Left;   LUMBAR LAMINECTOMY/DECOMPRESSION MICRODISCECTOMY Left 03/23/2013   Procedure: LUMBAR LAMINECTOMY/DECOMPRESSION MICRODISCECTOMY 1 LEVEL;  Surgeon: Eustace Moore, MD;  Location: [facility];  Service: Neurosurgery;  Laterality: Left;  LUMBAR LAMINECTOMY/DECOMPRESSION MICRODISCECTOMY 1 LEVEL   MASTECTOMY, PARTIAL Left 02/26/2003   ; S/P re-excision of cranial and lateral margins 04/19/2003.    RE-EXCISION OF BREAST CANCER,SUPERIOR MARGINS Right 10/27/2012   Procedure: RE-EXCISION OF BREAST CANCER,SUPERIOR and inferior MARGINS;  Surgeon: [name], MD;  Location: [facility];  Service: General;  Laterality: Right;   RE-EXCISION OF BREAST LUMPECTOMY Left 04/2003   TEE WITHOUT CARDIOVERSION N/A 04/04/2019   Procedure: Transesophageal Echocardiogram (Tee);  Surgeon: Wonda Olds, MD;  Location: [facility];  Service: Open Heart Surgery;  Laterality: N/A;   THORACIC  AORTIC ANEURYSM REPAIR N/A 04/04/2019   Procedure: THORACIC ASCENDING ANEURYSM REPAIR (AAA)  USING 28 MM X 30 CM HEMASHIELD PLATINUM VASCULAR GRAFT;  Surgeon: Wonda Olds, MD;  Location[facility];  Service: Open Heart Surgery;  Laterality: N/A;   Social History:  reports that she quit smoking about 2 years ago. Her smoking use included cigarettes. She has a 11.00 pack-year smoking history. She has never used smokeless tobacco. She reports that she does not currently use alcohol after a past usage of about 2.0 standard drinks per week. She reports that she does not currently use drugs after having used the following drugs: Cocaine.  Allergies  Allergen Reactions   Shrimp [Shellfish Allergy] Shortness Of Breath   Bactroban [Mupirocin] Other (See Comments)    "Sores in nose"   Vicodin [Hydrocodone-Acetaminophen] Itching and Nausea And Vomiting    This is patient's home medication   Eliquis [Apixaban] Itching    Spoke with patient, no rash.  Has tolerated on re-challenge.   Lisinopril Cough   Tylenol [Acetaminophen] Itching    Family History  Problem Relation Age of Onset   Colon cancer Mother 23   Hypertension Mother    Diabetes Sister 25   Hypertension Sister    Diabetes Brother  Hypertension Brother    Diabetes Brother    Hypertension Brother    Kidney disease Son        s/p renal transplant   Hypertension Son    Diabetes Son    Multiple sclerosis Son    Bone cancer Sister 64   Breast cancer Neg Hx    Cervical cancer Neg Hx     Prior to Admission medications   Medication Sig Start Date End Date Taking? Authorizing Provider  amLODipine (NORVASC) 10 MG tablet Take 1 tablet (10 mg total) by mouth daily. 02/14/21  Yes Maximiano Coss, NP  apixaban (ELIQUIS) 2.5 MG TABS tablet Take 1 tablet (2.5 mg total) by mouth 2 (two) times daily. 08/24/21  Yes Paige, Weldon Picking, DO  carvedilol (COREG) 3.125 MG tablet Take 1 tablet (3.125 mg total) by mouth 2 (two) times daily with a meal.  08/24/21  Yes Paige, Victoria J, DO  cetirizine (ZYRTEC) 10 MG tablet Take 10 mg by mouth daily as needed for allergies.   Yes [provider]  ibuprofen (ADVIL) 200 MG tablet Take 600 mg by mouth every 6 (six) hours as needed for headache or moderate pain.   Yes [provider]  isosorbide mononitrate (IMDUR) 30 MG 24 hr tablet Take 1 tablet (30 mg total) by mouth daily. 08/17/21  Yes Paige, Victoria J, DO  losartan (COZAAR) 100 MG tablet Take 1 tablet (100 mg total) by mouth daily. 02/14/21  Yes Maximiano Coss, NP  pregabalin (LYRICA) 50 MG capsule Take 100 mg by mouth at bedtime. 05/07/21  Yes [provider]  albuterol (VENTOLIN HFA) 108 (90 Base) MCG/ACT inhaler Inhale 2 puffs into the lungs every 6 (six) hours as needed for wheezing or shortness of breath. 07/30/20   Maximiano Coss, NP  cloNIDine (CATAPRES - DOSED IN MG/24 HR) 0.1 mg/24hr patch Place 1 patch (0.1 mg total) onto the skin every Saturday. 06/14/21   Caren Griffins, MD  hydrALAZINE (APRESOLINE) 50 MG tablet Take 1 tablet (50 mg total) by mouth 3 (three) times daily. 06/21/21 09/15/21  Nolberto Hanlon, MD  loperamide (IMODIUM) 2 MG capsule Take 2 mg by mouth as needed for diarrhea or loose stools. 07/15/20   [provider]  mirtazapine (REMERON) 7.5 MG tablet Take 1 tablet (7.5 mg total) by mouth at bedtime. Patient taking differently: Take 7.5 mg by mouth at bedtime as needed (sleep). 07/15/20   Maximiano Coss, NP  [facility] 4 MG/0.1ML LIQD nasal spray kit Place 1 spray into the nose as needed for opioid reversal. If no response within 3 minutes then repeat once in the other nostril.  Call 911. 05/15/20   [provider]  rosuvastatin (CRESTOR) 10 MG tablet Take 1 tablet (10 mg total) by mouth daily. 02/14/21 09/15/21  Maximiano Coss, NP  sevelamer carbonate (RENVELA) 2.4 g PACK Take 2.4 g by mouth 3 (three) times daily with meals. 05/15/21   [provider]  VITAMIN D PO Take 5,000 Units  by mouth daily.    [provider]    Physical Exam: Vitals:   10/29/21 0957 10/29/21 1000 10/29/21 1015 10/29/21 1030  BP: (!) 190/130 (!) 177/135 (!) 144/83 132/79  Pulse: 80 80 73 72  Resp: (!) 32 (!) 22 (!) 23 (!) 21  Temp:      TempSrc:      SpO2: 100% 100% 100% 100%  Weight:      Height:       Exam  Constitutional: Thin elderly female  who is currently resting and does not appear to be in acute distress. Eyes: PERRL, lids and conjunctivae normal ENMT: Mucous membranes are dry. Posterior pharynx clear of any exudate or lesions.  Neck: normal, supple, no masses Respiratory: Mildly tachypneic with rales appreciated.  Patient currently on 3 L nasal cannula oxygen with O2 saturations maintained. Cardiovascular: Irregular irregular with right upper chest wall hemodialysis catheter in place.  No extremity edema.  Abdomen: Generalized tenderness palpation noted on the abdomen.  Bowel sounds appreciated in all 4 quadrants. Musculoskeletal: no clubbing / cyanosis. No joint deformity upper and lower extremities. Good ROM, no contractures. Normal muscle tone.  Skin: no rashes, lesions, ulcers. No induration Neurologic: CN 2-12 grossly intact.  Able to move all extremities Psychiatric: Normal judgment and insight. Alert and oriented x 3. Normal mood.   Data Reviewed:  Patient appears to be in sinus rhythm at 97 bpm with QTc 523  Assessment and Plan: Acute respiratory failure with hypoxia secondary to fluid overload ESRD on HD heart failure with reduced EF Patient normally dialyzes TTS, but missed dialysis yesterday secondary to coughing.  Noted to be acutely hypoxic with O2 saturations as low as 88% on room air with improvement on 3 L nasal cannula oxygen.  Chest x-ray significant for pulmonary edema.  BNP greater than 4500.  Last EF noted to be 40 -45% with grade 3 diastolic dysfunction and severe left ventricular hypertrophy.  Nephrology have been formally consulted for need  of dialysis. -Admit to a progressive bed -Strict I&Os -Nephrology consulted for need of hemodialysis.  Nasal septum  Hypertensive urgency Acute.  On admission patient blood pressure noted to be elevated to 211/138.  She had been given labetalol 10 mg IV.  Due to acute bump in high-sensitivity troponin patient has been started on a nicardipine drip with significant improvement in blood pressures.  Nicardipine drip has since been discontinued.  Home medication regimen appears to include amlodipine 10 mg daily, Coreg 3.125 mg twice daily, hydralazine 50 mg 3 times daily, isosorbide mononitrate 30 mg daily, and losartan 100 mg daily.  Patient reports not being on clonidine 0.1 mg transdermal patch, but it appears it was recently prescribed. -Resume home blood pressure regimen including clonidine patch -Hydralazine IV as needed for elevated blood pressure  Elevated troponin Acute on chronic.  High-sensitivity troponin initially 271-> 268.  Previously troponin at been elevated in the 100s.  Denies any chest pain complaints at this time.  Suspect secondary to demand in the setting of fluid overload -Continue to monitor  Leukopenia Acute.  WBC 3.9.  Patient denies any reports of fever or chills.  Chest x-ray concerning for mild edema. -Check urinalysis -Recheck CBC tomorrow morning  Metabolic acidosis with increased anion gap On admission CO2 18 with anion gap initially 20.  Suspect secondary to patient's kidney failure with recently missed dialysis treatment. -Monitor for correction with dialysis  Paroxysmal atrial fibrillation on chronic anticoagulation Patient currently appears to be in sinus rhythm on EKG, but on physical exam was noted to be in atrial fibrillation. -Continue Eliquis  Prolonged QT interval Acute.  QTc 523. -Avoid QT prolonging medications -Recheck EKG tomorrow morning  Tobacco abuse Patient still reports intermittently smoking. -Continue to counsel on need of cessation  of tobacco use  Other chronic conditions were medically managed with home medications and formulary alternatives as necessary    Advance Care Planning:   Code Status: Full Code    Consults: Nephrology  Family Communication: None requested  Severity  of Illness: The appropriate patient status for this patient is OBSERVATION. Observation status is judged to be reasonable and necessary in order to provide the required intensity of service to ensure the patient's safety. The patient's presenting symptoms, physical exam findings, and initial radiographic and laboratory data in the context of their medical condition is felt to place them at decreased risk for further clinical deterioration. Furthermore, it is anticipated that the patient will be medically stable for discharge from the hospital within 2 midnights of admission.   Author: Norval Morton, MD 10/29/2021 11:07 AM  For on call review [email or web address].

## 2021-10-29 NOTE — Plan of Care (Signed)
  Problem: Education: Goal: Knowledge of General Education information will improve Description Including pain rating scale, medication(s)/side effects and non-pharmacologic comfort measures Outcome: Progressing   Problem: Health Behavior/Discharge Planning: Goal: Ability to manage health-related needs will improve Outcome: Progressing   

## 2021-10-30 DIAGNOSIS — E8770 Fluid overload, unspecified: Secondary | ICD-10-CM | POA: Diagnosis not present

## 2021-10-30 LAB — RENAL FUNCTION PANEL
Albumin: 3.6 g/dL (ref 3.5–5.0)
Anion gap: 12 (ref 5–15)
BUN: 75 mg/dL — ABNORMAL HIGH (ref 8–23)
CO2: 26 mmol/L (ref 22–32)
Calcium: 9.2 mg/dL (ref 8.9–10.3)
Chloride: 100 mmol/L (ref 98–111)
Creatinine, Ser: 9.33 mg/dL — ABNORMAL HIGH (ref 0.44–1.00)
GFR, Estimated: 4 mL/min — ABNORMAL LOW (ref >60)
Glucose, Bld: 97 mg/dL (ref 70–99)
Phosphorus: 5.6 mg/dL — ABNORMAL HIGH (ref 2.5–4.6)
Potassium: 4 mmol/L (ref 3.5–5.1)
Sodium: 138 mmol/L (ref 135–145)

## 2021-10-30 LAB — HEPATITIS B SURFACE ANTIBODY, QUANTITATIVE: Hep B S AB Quant (Post): 118.8 m[IU]/mL (ref >9.9)

## 2021-10-30 LAB — CBC
HCT: 32.9 % — ABNORMAL LOW (ref 36.0–46.0)
Hemoglobin: 10.2 g/dL — ABNORMAL LOW (ref 12.0–15.0)
MCH: 27.6 pg (ref 26.0–34.0)
MCHC: 31 g/dL (ref 30.0–36.0)
MCV: 89.2 fL (ref 80.0–100.0)
Platelets: 162 10*3/uL (ref 150–400)
RBC: 3.69 MIL/uL — ABNORMAL LOW (ref 3.87–5.11)
RDW: 18.5 % — ABNORMAL HIGH (ref 11.5–15.5)
WBC: 3.9 10*3/uL — ABNORMAL LOW (ref 4.0–10.5)
nRBC: 0 % (ref 0.0–0.2)

## 2021-10-30 LAB — HIV ANTIBODY (ROUTINE TESTING W REFLEX): HIV Screen 4th Generation wRfx: NONREACTIVE

## 2021-10-30 LAB — HEPATITIS B CORE ANTIBODY, IGM: Hep B C IgM: NONREACTIVE

## 2021-10-30 LAB — HEPATITIS B SURFACE ANTIGEN: Hepatitis B Surface Ag: NONREACTIVE

## 2021-10-30 MED ORDER — SUCROFERRIC OXYHYDROXIDE 500 MG PO CHEW
1000.0000 mg | CHEWABLE_TABLET | Freq: Three times a day (TID) | ORAL | Status: DC
  Filled 2021-10-30 (×3): qty 2

## 2021-10-30 NOTE — Progress Notes (Signed)
Pt off the floor with transport to HD.

## 2021-10-30 NOTE — Progress Notes (Signed)
Pt for d/c today. Contacted [facility] to advise clinic of pt's d/c today and that pt should resume care on Saturday.   Melven Sartorius Renal Navigator [phone]

## 2021-10-30 NOTE — Progress Notes (Signed)
[facility] Progress Note  Subjective: pt seen on HD. Was on RA overnight and tolerating 3.5 L UF goal.   Vitals:   10/30/21 0029 10/30/21 0300 10/30/21 0430 10/30/21 0700  BP: (!) 156/105 (!) 157/123 (!) 169/110 (!) 173/104  Pulse: 81 72 71 61  Resp: 20 11 (!) 24 16  Temp: 97.7 F (36.5 C) 97.7 F (36.5 C) 97.6 F (36.4 C)   TempSrc: Oral Axillary Oral   SpO2: 99% 100% 100% 100%  Weight:      Height:        Exam: Gen eldelry AAF, lying flat, on RA, ox 3 No jvd or bruits Chest cta today , no rales/ rhonchi RRR no RG Abd soft ntnd no mass or ascites +bs Ext no LE  edema Neuro nonfocal, Ox 3    RIJ TDC +intact          Home meds include - amlodipine, apixaban, carvedilol, cetirizine, hydralazine 50 tid, advil, isosorbide mononitrate 30, loperamide, losartan 100, mirtazapine, nracan, pregabalin 100hs, velphoro 1 gm ac tid, torsemide 40, vit D, albuterol, clonidine patch 0.1 weekly, rosuvastatin, sevelamer 2.4gm tid ac     CXR - mild early IS edema    OP HD: TTS [place]  3h 12mn  400/1.5  43.5kg   2/2 bath P2   TDC   Heparin none - hectorol 3 ug IV tiw - last HD 5/27, 47.8 > 46.1kg, coming off 2-3 kg over - last Hb 12.4 on 5/25   Assessment/ Plan: AHRF - secondary to fluid overload and missed dialysis. Did not get HD last night d/t high census. Is on HD this morning. Resp status is stable, on RA. Lower vol as tolerated w/ HD today.  ESRD - on HD TTS.  Missed OP HD 5/30. HD today as above.  HTNsive urgency - getting home BP meds x 4, also started on new catapres patch. SP IV nicardipine overnight. Get volume down w HD today.  PAF- on eliquis, BB Anemia esrd - Hb 12-13, no esa needs MBD ckd - Ca borderline high, phos is in range. Holding vdra for now. Cont velphoro as binder.     Rob Elishah Ashmore 10/30/2021, 7:24 AM   Recent Labs  Lab 10/29/21 0840 10/29/21 0857 10/29/21 1042 10/30/21 0530  HGB 12.3 13.3  --  10.2*  ALBUMIN  --   --   --  3.6   CALCIUM 10.3  --   --  9.2  PHOS  --   --  7.6* 5.6*  CREATININE 11.77* 12.40*  --  9.33*  K 5.1 5.0  --  4.0   Inpatient medications:  amLODipine  10 mg Oral Daily   apixaban  2.5 mg Oral BID   carvedilol  3.125 mg Oral BID WC   Chlorhexidine Gluconate Cloth  6 each Topical Q0600   [START ON 11/01/2021] cloNIDine  0.1 mg Transdermal Q Sat   hydrALAZINE  50 mg Oral TID   isosorbide mononitrate  30 mg Oral Daily   losartan  100 mg Oral Daily   pregabalin  100 mg Oral QHS   sodium chloride flush  3 mL Intravenous Q12H    anticoagulant sodium citrate     albuterol, alteplase, anticoagulant sodium citrate, heparin, hydrALAZINE, lidocaine (PF), lidocaine-prilocaine, loratadine, pentafluoroprop-tetrafluoroeth

## 2021-10-30 NOTE — Progress Notes (Signed)
Hemodialysis consent signed by patient and placed in chart.

## 2021-10-30 NOTE — Progress Notes (Signed)
Lung sounds assessed. Clear to auscultation. Patient denies pain.

## 2021-10-30 NOTE — TOC Transition Note (Signed)
Transition of Care [facility] - CM/SW Discharge Note   Patient Details  Name: Monique Henderson MRN: [id number] Date of Birth: [birth year]  Transition of Care [facility] CM/SW Contact:  Cyndi Bender, RN Phone Number: 10/30/2021, 11:28 AM   Clinical Narrative:     Patient stable for discharge. Sister can transport home. Prescriptions sent to [place].   Patient states she has a PCP and his name  is Dr. Wynetta Emery but can't remember first name. Patient encouraged to get a scale and weigh everyday to monitor fluid overload.  No other TOC needs. Final next level of care: Home/Self Care Barriers to Discharge: Barriers Resolved   Patient Goals and CMS Choice Patient states their goals for this hospitalization and ongoing recovery are:: return home      Discharge Placement             home          Discharge Plan and Services               home                      Social Determinants of Health (SDOH) Interventions     Readmission Risk Interventions    05/22/2019    4:31 PM  Readmission Risk Prevention Plan  Transportation Screening Complete  PCP or Specialist Appt within 3-5 Days Complete  HRI or [place] Complete  Social Work Consult for [place] Planning/Counseling Complete  Palliative Care Screening Not Applicable  Medication Review Press photographer) Complete

## 2021-10-30 NOTE — Discharge Summary (Signed)
PATIENT DETAILS Name: Monique Henderson Age: 71 y.o. Sex: female Date of Birth: 1951/03/08 MRN: [id number]. Admitting Physician: Norval Morton, MD PCP:Pcp, No  Admit Date: 10/29/2021 Discharge date: 10/30/2021  Recommendations for Outpatient Follow-up:  Follow up with PCP in 1-2 weeks Please obtain CMP/CBC in one week  Admitted From:  Home  Disposition: Home   Discharge Condition: good  CODE STATUS:   Code Status: Full Code   Diet recommendation:  Diet Order             Diet - low sodium heart healthy           Diet renal with fluid restriction Fluid restriction: 1200 mL Fluid; Room service appropriate? Yes; Fluid consistency: Thin  Diet effective now                    Brief Summary: Patient is a 71 year old female with history of ESRD on HD, chronic HFrEF, PAF on anticoagulation-who missed dialysis-and subsequently presented to the hospital with shortness of breath-patient was found to have acute hypoxic respiratory failure in the setting of fluid overload-and subsequently admitted to the hospitalist service.  Brief Hospital Course: Acute hypoxic respiratory failure due to fluid overload in the setting of ESRD and chronic HFrEF: Remarkably better after HD-on room air.  Feels much better than yesterday.  Requesting discharge-stable for discharge today.  No signs of pneumonia.  Hypertensive urgency: Discussed with nephrology-patient has chronic resistant hypertension-blood pressure apparently fluctuates quite a bit-plan is to continue home regimen-nephrology will attempt to reduce her dry weight.  Minimally elevated troponin: Demand ischemia-doubt any further work-up is required.  PAF: Continue Eliquis  ESRD on HD TTS: S/p HD earlier this morning-resume outpatient HD schedule.  Rest of the patient's medical issues were stable during the short overnight hospitalization.  BMI: Estimated body mass index is 18.35 kg/m as calculated from the following:    Height as of this encounter: _0  (1.6 m).   Weight as of this encounter: 47 kg.    Discharge Diagnoses:  Principal Problem:   Fluid overload Active Problems:   Acute respiratory failure with hypoxia (HCC)   ESRD (end stage renal disease) on dialysis (HCC)   Heart failure with reduced ejection fraction (HCC)   Hypertensive urgency   Troponin level elevated   Metabolic acidosis with increased anion gap and accumulation of organic acids   AF (paroxysmal atrial fibrillation) (HCC)   Chronic anticoagulation   Prolonged QT interval   Tobacco use disorder   Leukopenia   Discharge Instructions:  Activity:  As tolerated   Discharge Instructions     Call MD for:  difficulty breathing, headache or visual disturbances   Complete by: As directed    Diet - low sodium heart healthy   Complete by: As directed    Discharge instructions   Complete by: As directed    Follow with Primary MD in 1-2 weeks  Please get a complete blood count and chemistry panel checked by your Primary MD at your next visit, and again as instructed by your Primary MD.  Get Medicines reviewed and adjusted: Please take all your medications with you for your next visit with your Primary MD  Laboratory/radiological data: Please request your Primary MD to go over all hospital tests and procedure/radiological results at the follow up, please ask your Primary MD to get all Hospital records sent to his/her office.  In some cases, they will be blood work, cultures and biopsy results pending at  the time of your discharge. Please request that your primary care M.D. follows up on these results.  Also Note the following: If you experience worsening of your admission symptoms, develop shortness of breath, life threatening emergency, suicidal or homicidal thoughts you must seek medical attention immediately by calling 911 or calling your MD immediately  if symptoms less severe.  You must read complete  instructions/literature along with all the possible adverse reactions/side effects for all the Medicines you take and that have been prescribed to you. Take any new Medicines after you have completely understood and accpet all the possible adverse reactions/side effects.   Do not drive when taking Pain medications or sleeping medications (Benzodaizepines)  Do not take more than prescribed Pain, Sleep and Anxiety Medications. It is not advisable to combine anxiety,sleep and pain medications without talking with your primary care practitioner  Special Instructions: If you have smoked or chewed Tobacco  in the last 2 yrs please stop smoking, stop any regular Alcohol  and or any Recreational drug use.  Wear Seat belts while driving.  Please note: You were cared for by a hospitalist during your hospital stay. Once you are discharged, your primary care physician will handle any further medical issues. Please note that NO REFILLS for any discharge medications will be authorized once you are discharged, as it is imperative that you return to your primary care physician (or establish a relationship with a primary care physician if you do not have one) for your post hospital discharge needs so that they can reassess your need for medications and monitor your lab values.   Increase activity slowly   Complete by: As directed    No wound care   Complete by: As directed       Allergies as of 10/30/2021       Reactions   Shrimp [shellfish Allergy] Shortness Of Breath   Bactroban [mupirocin] Other (See Comments)   "Sores in nose"   Vicodin [hydrocodone-acetaminophen] Itching, Nausea And Vomiting   This is patient's home medication   Eliquis [apixaban] Itching   Spoke with patient, no rash.  Has tolerated on re-challenge.   Lisinopril Cough   Tylenol [acetaminophen] Itching        Medication List     TAKE these medications    albuterol 108 (90 Base) MCG/ACT inhaler Commonly known as: VENTOLIN  HFA Inhale 2 puffs into the lungs every 6 (six) hours as needed for wheezing or shortness of breath.   amLODipine 10 MG tablet Commonly known as: NORVASC Take 1 tablet (10 mg total) by mouth daily.   apixaban 2.5 MG Tabs tablet Commonly known as: ELIQUIS Take 1 tablet (2.5 mg total) by mouth 2 (two) times daily.   carvedilol 3.125 MG tablet Commonly known as: COREG Take 1 tablet (3.125 mg total) by mouth 2 (two) times daily with a meal.   cetirizine 10 MG tablet Commonly known as: ZYRTEC Take 10 mg by mouth daily as needed for allergies.   cloNIDine 0.1 mg/24hr patch Commonly known as: CATAPRES - Dosed in mg/24 hr Place 1 patch (0.1 mg total) onto the skin every Saturday.   hydrALAZINE 50 MG tablet Commonly known as: APRESOLINE Take 1 tablet (50 mg total) by mouth 3 (three) times daily.   ibuprofen 200 MG tablet Commonly known as: ADVIL Take 600 mg by mouth every 6 (six) hours as needed for headache or moderate pain.   isosorbide mononitrate 30 MG 24 hr tablet Commonly known as: IMDUR Take 1  tablet (30 mg total) by mouth daily.   loperamide 2 MG capsule Commonly known as: IMODIUM Take 2 mg by mouth as needed for diarrhea or loose stools.   losartan 100 MG tablet Commonly known as: COZAAR Take 1 tablet (100 mg total) by mouth daily.   mirtazapine 7.5 MG tablet Commonly known as: REMERON Take 1 tablet (7.5 mg total) by mouth at bedtime. What changed:  when to take this reasons to take this   Narcan 4 MG/0.1ML Liqd nasal spray kit Generic drug: naloxone Place 1 spray into the nose as needed for opioid reversal. If no response within 3 minutes then repeat once in the other nostril.  Call 911.   pregabalin 50 MG capsule Commonly known as: LYRICA Take 100 mg by mouth at bedtime.   rosuvastatin 10 MG tablet Commonly known as: CRESTOR Take 1 tablet (10 mg total) by mouth daily.   sevelamer carbonate 2.4 g Pack Commonly known as: RENVELA Take 2.4 g by mouth 3  (three) times daily with meals.   torsemide 20 MG tablet Commonly known as: DEMADEX Take 40 mg by mouth daily.   Velphoro 500 MG chewable tablet Generic drug: sucroferric oxyhydroxide Chew 1,000 mg by mouth 3 (three) times daily with meals.   VITAMIN D PO Take 5,000 Units by mouth daily.        Follow-up Information     Primary care practitioner. Schedule an appointment as soon as possible for a visit in 1 week(s).          Hemodialysis Follow up.   Why: Resume at your usual schedule.               Allergies  Allergen Reactions   Shrimp [Shellfish Allergy] Shortness Of Breath   Bactroban [Mupirocin] Other (See Comments)    "Sores in nose"   Vicodin [Hydrocodone-Acetaminophen] Itching and Nausea And Vomiting    This is patient's home medication   Eliquis [Apixaban] Itching    Spoke with patient, no rash.  Has tolerated on re-challenge.   Lisinopril Cough   Tylenol [Acetaminophen] Itching     Other Procedures/Studies: DG Chest 2 View  Result Date: 10/29/2021 CLINICAL DATA:  Shortness of breath.  Missed dialysis EXAM: CHEST - 2 VIEW COMPARISON:  09/14/2021 FINDINGS: Right-sided hemodialysis catheter in stable positioning. Stable cardiomegaly. Prior median sternotomy. Aortic atherosclerosis. Mildly increased bibasilar interstitial markings. No pleural effusion. No pneumothorax. IMPRESSION: Mildly increased bibasilar interstitial markings, could reflect mild edema. Electronically Signed   By: Davina Poke D.O.   On: 10/29/2021 09:09     TODAY-DAY OF DISCHARGE:  Subjective:   Monique Henderson today has no headache,no chest abdominal pain,no new weakness tingling or numbness, feels much better wants to go home today.  Objective:   Blood pressure (!) 200/123, pulse 75, temperature 97.6 F (36.4 C), temperature source Oral, resp. rate (!) 21, height _0  (1.6 m), weight 47 kg, SpO2 100 %. No intake or output data in the 24 hours ending 10/30/21 1026 Filed  Weights   10/29/21 0832 10/30/21 0430  Weight: 45 kg 47 kg    Exam: Awake Alert, Oriented *3, No new F.N deficits, Normal affect [place].AT,PERRAL Supple Neck,No JVD, No cervical lymphadenopathy appriciated.  Symmetrical Chest wall movement, Good air movement bilaterally, CTAB RRR,No Gallops,Rubs or new Murmurs, No Parasternal Heave +ve B.Sounds, Abd Soft, Non tender, No organomegaly appriciated, No rebound -guarding or rigidity. No Cyanosis, Clubbing or edema, No new Rash or bruise   PERTINENT RADIOLOGIC STUDIES: DG  Chest 2 View  Result Date: 10/29/2021 CLINICAL DATA:  Shortness of breath.  Missed dialysis EXAM: CHEST - 2 VIEW COMPARISON:  09/14/2021 FINDINGS: Right-sided hemodialysis catheter in stable positioning. Stable cardiomegaly. Prior median sternotomy. Aortic atherosclerosis. Mildly increased bibasilar interstitial markings. No pleural effusion. No pneumothorax. IMPRESSION: Mildly increased bibasilar interstitial markings, could reflect mild edema. Electronically Signed   By: Davina Poke D.O.   On: 10/29/2021 09:09     PERTINENT LAB RESULTS: CBC: Recent Labs    10/29/21 0840 10/29/21 0857 10/30/21 0530  WBC 3.9*  --  3.9*  HGB 12.3 13.3 10.2*  HCT 39.2 39.0 32.9*  PLT 165  --  162   CMET CMP     Component Value Date/Time   NA 138 10/30/2021 0530   NA 140 05/13/2020 1108   NA 142 09/28/2012 0831   K 4.0 10/30/2021 0530   K 4.2 09/28/2012 0831   CL 100 10/30/2021 0530   CL 103 09/28/2012 0831   CO2 26 10/30/2021 0530   CO2 30 (H) 09/28/2012 0831   GLUCOSE 97 10/30/2021 0530   GLUCOSE 103 (H) 09/28/2012 0831   BUN 75 (H) 10/30/2021 0530   BUN 47 (H) 05/13/2020 1108   BUN 18.3 09/28/2012 0831   CREATININE 9.33 (H) 10/30/2021 0530   CREATININE 2.09 (H) 01/18/2017 1145   CREATININE 1.2 (H) 09/28/2012 0831   CALCIUM 9.2 10/30/2021 0530   CALCIUM 9.4 09/28/2012 0831   PROT 7.3 09/15/2021 0001   PROT 6.9 05/13/2020 1108   PROT 7.2 09/28/2012 0831   ALBUMIN  3.6 10/30/2021 0530   ALBUMIN 4.0 05/13/2020 1108   ALBUMIN 3.5 09/28/2012 0831   AST 24 09/15/2021 0001   AST 35 (H) 09/28/2012 0831   ALT 20 09/15/2021 0001   ALT 23 12/09/2016 1458   ALT 41 09/28/2012 0831   ALKPHOS 111 09/15/2021 0001   ALKPHOS 89 09/28/2012 0831   BILITOT 0.7 09/15/2021 0001   BILITOT 0.2 05/13/2020 1108   BILITOT 0.34 09/28/2012 0831   GFRNONAA 4 (L) 10/30/2021 0530   GFRNONAA 23 (L) 12/09/2016 1458   GFRAA 8 (L) 05/13/2020 1108   GFRAA 26 (L) 12/09/2016 1458    GFR Estimated Creatinine Clearance: 4.2 mL/min (A) (by C-G formula based on SCr of 9.33 mg/dL (H)). No results for input(s): LIPASE, AMYLASE in the last 72 hours. No results for input(s): CKTOTAL, CKMB, CKMBINDEX, TROPONINI in the last 72 hours. Invalid input(s): POCBNP No results for input(s): DDIMER in the last 72 hours. No results for input(s): HGBA1C in the last 72 hours. No results for input(s): CHOL, HDL, LDLCALC, TRIG, CHOLHDL, LDLDIRECT in the last 72 hours. No results for input(s): TSH, T4TOTAL, T3FREE, THYROIDAB in the last 72 hours.  Invalid input(s): FREET3 No results for input(s): VITAMINB12, FOLATE, FERRITIN, TIBC, IRON, RETICCTPCT in the last 72 hours. Coags: No results for input(s): INR in the last 72 hours.  Invalid input(s): PT Microbiology: Recent Results (from the past 240 hour(s))  Resp Panel by RT-PCR (Flu A&B, Covid) Anterior Nasal Swab     Status: None   Collection Time: 10/29/21  8:41 AM   Specimen: Anterior Nasal Swab  Result Value Ref Range Status   SARS Coronavirus 2 by RT PCR NEGATIVE NEGATIVE Final    Comment: (NOTE) SARS-CoV-2 target nucleic acids are NOT DETECTED.  The SARS-CoV-2 RNA is generally detectable in upper respiratory specimens during the acute phase of infection. The lowest concentration of SARS-CoV-2 viral copies this assay can detect is 138  copies/mL. A negative result does not preclude SARS-Cov-2 infection and should not be used as the sole  basis for treatment or other patient management decisions. A negative result may occur with  improper specimen collection/handling, submission of specimen other than nasopharyngeal swab, presence of viral mutation(s) within the areas targeted by this assay, and inadequate number of viral copies(<138 copies/mL). A negative result must be combined with clinical observations, patient history, and epidemiological information. The expected result is Negative.  Fact Sheet for Patients:  EntrepreneurPulse.com.au  Fact Sheet for Healthcare Providers:  IncredibleEmployment.be  This test is no t yet approved or cleared by the [place] FDA and  has been authorized for detection and/or diagnosis of SARS-CoV-2 by FDA under an Emergency Use Authorization (EUA). This EUA will remain  in effect (meaning this test can be used) for the duration of the COVID-19 declaration under Section 564(b)(1) of the Act, 21 U.S.C.section 360bbb-3(b)(1), unless the authorization is terminated  or revoked sooner.       Influenza A by PCR NEGATIVE NEGATIVE Final   Influenza B by PCR NEGATIVE NEGATIVE Final    Comment: (NOTE) The Xpert Xpress SARS-CoV-2/FLU/RSV plus assay is intended as an aid in the diagnosis of influenza from Nasopharyngeal swab specimens and should not be used as a sole basis for treatment. Nasal washings and aspirates are unacceptable for Xpert Xpress SARS-CoV-2/FLU/RSV testing.  Fact Sheet for Patients: EntrepreneurPulse.com.au  Fact Sheet for Healthcare Providers: IncredibleEmployment.be  This test is not yet approved or cleared by the [place] FDA and has been authorized for detection and/or diagnosis of SARS-CoV-2 by FDA under an Emergency Use Authorization (EUA). This EUA will remain in effect (meaning this test can be used) for the duration of the COVID-19 declaration under Section 564(b)(1) of the Act,  21 U.S.C. section 360bbb-3(b)(1), unless the authorization is terminated or revoked.  Performed at [facility], [place] [address] [place], [place] [address]     FURTHER DISCHARGE INSTRUCTIONS:  Get Medicines reviewed and adjusted: Please take all your medications with you for your next visit with your Primary MD  Laboratory/radiological data: Please request your Primary MD to go over all hospital tests and procedure/radiological results at the follow up, please ask your Primary MD to get all Hospital records sent to his/her office.  In some cases, they will be blood work, cultures and biopsy results pending at the time of your discharge. Please request that your primary care M.D. goes through all the records of your hospital data and follows up on these results.  Also Note the following: If you experience worsening of your admission symptoms, develop shortness of breath, life threatening emergency, suicidal or homicidal thoughts you must seek medical attention immediately by calling 911 or calling your MD immediately  if symptoms less severe.  You must read complete instructions/literature along with all the possible adverse reactions/side effects for all the Medicines you take and that have been prescribed to you. Take any new Medicines after you have completely understood and accpet all the possible adverse reactions/side effects.   Do not drive when taking Pain medications or sleeping medications (Benzodaizepines)  Do not take more than prescribed Pain, Sleep and Anxiety Medications. It is not advisable to combine anxiety,sleep and pain medications without talking with your primary care practitioner  Special Instructions: If you have smoked or chewed Tobacco  in the last 2 yrs please stop smoking, stop any regular Alcohol  and or any Recreational drug use.  Wear Seat belts  while driving.  Please note: You were cared for by a hospitalist during your hospital stay. Once you are  discharged, your primary care physician will handle any further medical issues. Please note that NO REFILLS for any discharge medications will be authorized once you are discharged, as it is imperative that you return to your primary care physician (or establish a relationship with a primary care physician if you do not have one) for your post hospital discharge needs so that they can reassess your need for medications and monitor your lab values.  Total Time spent coordinating discharge including counseling, education and face to face time equals less than 30 minutes.  SignedOren Binet 10/30/2021 10:26 AM

## 2021-10-31 LAB — HEPATITIS B SURFACE ANTIBODY, QUANTITATIVE: Hep B S AB Quant (Post): 126.9 m[IU]/mL (ref >9.9)

## 2021-11-01 DIAGNOSIS — Z992 Dependence on renal dialysis: Secondary | ICD-10-CM | POA: Diagnosis not present

## 2021-11-01 DIAGNOSIS — N2581 Secondary hyperparathyroidism of renal origin: Secondary | ICD-10-CM | POA: Diagnosis not present

## 2021-11-01 DIAGNOSIS — N186 End stage renal disease: Secondary | ICD-10-CM | POA: Diagnosis not present

## 2021-11-04 DIAGNOSIS — N186 End stage renal disease: Secondary | ICD-10-CM | POA: Diagnosis not present

## 2021-11-04 DIAGNOSIS — Z992 Dependence on renal dialysis: Secondary | ICD-10-CM | POA: Diagnosis not present

## 2021-11-04 DIAGNOSIS — N2581 Secondary hyperparathyroidism of renal origin: Secondary | ICD-10-CM | POA: Diagnosis not present

## 2021-11-06 DIAGNOSIS — Z992 Dependence on renal dialysis: Secondary | ICD-10-CM | POA: Diagnosis not present

## 2021-11-06 DIAGNOSIS — N186 End stage renal disease: Secondary | ICD-10-CM | POA: Diagnosis not present

## 2021-11-06 DIAGNOSIS — N2581 Secondary hyperparathyroidism of renal origin: Secondary | ICD-10-CM | POA: Diagnosis not present

## 2021-11-08 DIAGNOSIS — Z992 Dependence on renal dialysis: Secondary | ICD-10-CM | POA: Diagnosis not present

## 2021-11-08 DIAGNOSIS — N186 End stage renal disease: Secondary | ICD-10-CM | POA: Diagnosis not present

## 2021-11-08 DIAGNOSIS — N2581 Secondary hyperparathyroidism of renal origin: Secondary | ICD-10-CM | POA: Diagnosis not present

## 2021-11-11 DIAGNOSIS — Z992 Dependence on renal dialysis: Secondary | ICD-10-CM | POA: Diagnosis not present

## 2021-11-11 DIAGNOSIS — N2581 Secondary hyperparathyroidism of renal origin: Secondary | ICD-10-CM | POA: Diagnosis not present

## 2021-11-11 DIAGNOSIS — N186 End stage renal disease: Secondary | ICD-10-CM | POA: Diagnosis not present

## 2021-11-13 DIAGNOSIS — N186 End stage renal disease: Secondary | ICD-10-CM | POA: Diagnosis not present

## 2021-11-13 DIAGNOSIS — Z992 Dependence on renal dialysis: Secondary | ICD-10-CM | POA: Diagnosis not present

## 2021-11-13 DIAGNOSIS — N2581 Secondary hyperparathyroidism of renal origin: Secondary | ICD-10-CM | POA: Diagnosis not present

## 2021-11-15 DIAGNOSIS — N186 End stage renal disease: Secondary | ICD-10-CM | POA: Diagnosis not present

## 2021-11-15 DIAGNOSIS — Z992 Dependence on renal dialysis: Secondary | ICD-10-CM | POA: Diagnosis not present

## 2021-11-15 DIAGNOSIS — N2581 Secondary hyperparathyroidism of renal origin: Secondary | ICD-10-CM | POA: Diagnosis not present

## 2021-11-18 ENCOUNTER — Other Ambulatory Visit: Payer: Self-pay

## 2021-11-18 ENCOUNTER — Emergency Department (HOSPITAL_COMMUNITY): Payer: Medicare HMO

## 2021-11-18 ENCOUNTER — Emergency Department (HOSPITAL_COMMUNITY)
Admission: EM | Admit: 2021-11-18 | Discharge: 2021-11-18 | Disposition: A | Discharge status: Home / Self Care | Payer: Medicare HMO | Attending: Emergency Medicine | Admitting: Emergency Medicine

## 2021-11-18 DIAGNOSIS — I120 Hypertensive chronic kidney disease with stage 5 chronic kidney disease or end stage renal disease: Secondary | ICD-10-CM | POA: Diagnosis not present

## 2021-11-18 DIAGNOSIS — R1084 Generalized abdominal pain: Secondary | ICD-10-CM | POA: Diagnosis not present

## 2021-11-18 DIAGNOSIS — N186 End stage renal disease: Secondary | ICD-10-CM | POA: Diagnosis not present

## 2021-11-18 DIAGNOSIS — R0602 Shortness of breath: Secondary | ICD-10-CM | POA: Insufficient documentation

## 2021-11-18 DIAGNOSIS — R059 Cough, unspecified: Secondary | ICD-10-CM | POA: Diagnosis not present

## 2021-11-18 DIAGNOSIS — R778 Other specified abnormalities of plasma proteins: Secondary | ICD-10-CM

## 2021-11-18 DIAGNOSIS — R109 Unspecified abdominal pain: Secondary | ICD-10-CM | POA: Insufficient documentation

## 2021-11-18 DIAGNOSIS — Z992 Dependence on renal dialysis: Secondary | ICD-10-CM | POA: Insufficient documentation

## 2021-11-18 DIAGNOSIS — R011 Cardiac murmur, unspecified: Secondary | ICD-10-CM | POA: Insufficient documentation

## 2021-11-18 DIAGNOSIS — R140 Abdominal distension (gaseous): Secondary | ICD-10-CM | POA: Insufficient documentation

## 2021-11-18 DIAGNOSIS — I10 Essential (primary) hypertension: Secondary | ICD-10-CM | POA: Diagnosis not present

## 2021-11-18 DIAGNOSIS — Z7901 Long term (current) use of anticoagulants: Secondary | ICD-10-CM | POA: Diagnosis not present

## 2021-11-18 DIAGNOSIS — D649 Anemia, unspecified: Secondary | ICD-10-CM | POA: Diagnosis not present

## 2021-11-18 DIAGNOSIS — J811 Chronic pulmonary edema: Secondary | ICD-10-CM | POA: Diagnosis not present

## 2021-11-18 DIAGNOSIS — I4891 Unspecified atrial fibrillation: Secondary | ICD-10-CM

## 2021-11-18 DIAGNOSIS — R188 Other ascites: Secondary | ICD-10-CM | POA: Diagnosis not present

## 2021-11-18 DIAGNOSIS — R0600 Dyspnea, unspecified: Secondary | ICD-10-CM | POA: Diagnosis not present

## 2021-11-18 DIAGNOSIS — E875 Hyperkalemia: Secondary | ICD-10-CM

## 2021-11-18 DIAGNOSIS — R079 Chest pain, unspecified: Secondary | ICD-10-CM | POA: Diagnosis not present

## 2021-11-18 DIAGNOSIS — R000 Tachycardia, unspecified: Secondary | ICD-10-CM | POA: Diagnosis not present

## 2021-11-18 LAB — I-STAT CHEM 8, ED
BUN: 58 mg/dL — ABNORMAL HIGH (ref 8–23)
Calcium, Ion: 1.14 mmol/L — ABNORMAL LOW (ref 1.15–1.40)
Chloride: 102 mmol/L (ref 98–111)
Creatinine, Ser: 10.6 mg/dL — ABNORMAL HIGH (ref 0.44–1.00)
Glucose, Bld: 119 mg/dL — ABNORMAL HIGH (ref 70–99)
HCT: 39 % (ref 36.0–46.0)
Hemoglobin: 13.3 g/dL (ref 12.0–15.0)
Potassium: 5.3 mmol/L — ABNORMAL HIGH (ref 3.5–5.1)
Sodium: 137 mmol/L (ref 135–145)
TCO2: 27 mmol/L (ref 22–32)

## 2021-11-18 LAB — TROPONIN I (HIGH SENSITIVITY)
Troponin I (High Sensitivity): 220 ng/L (ref <18)
Troponin I (High Sensitivity): 244 ng/L (ref <18)

## 2021-11-18 LAB — BASIC METABOLIC PANEL
Anion gap: 14 (ref 5–15)
BUN: 14 mg/dL (ref 8–23)
CO2: 27 mmol/L (ref 22–32)
Calcium: 9.5 mg/dL (ref 8.9–10.3)
Chloride: 96 mmol/L — ABNORMAL LOW (ref 98–111)
Creatinine, Ser: 4.27 mg/dL — ABNORMAL HIGH (ref 0.44–1.00)
GFR, Estimated: 11 mL/min — ABNORMAL LOW (ref >60)
Glucose, Bld: 104 mg/dL — ABNORMAL HIGH (ref 70–99)
Potassium: 3.4 mmol/L — ABNORMAL LOW (ref 3.5–5.1)
Sodium: 137 mmol/L (ref 135–145)

## 2021-11-18 LAB — COMPREHENSIVE METABOLIC PANEL
ALT: 23 U/L (ref 0–44)
AST: 25 U/L (ref 15–41)
Albumin: 4 g/dL (ref 3.5–5.0)
Alkaline Phosphatase: 106 U/L (ref 38–126)
Anion gap: 19 — ABNORMAL HIGH (ref 5–15)
BUN: 46 mg/dL — ABNORMAL HIGH (ref 8–23)
CO2: 23 mmol/L (ref 22–32)
Calcium: 10.7 mg/dL — ABNORMAL HIGH (ref 8.9–10.3)
Chloride: 97 mmol/L — ABNORMAL LOW (ref 98–111)
Creatinine, Ser: 9.49 mg/dL — ABNORMAL HIGH (ref 0.44–1.00)
GFR, Estimated: 4 mL/min — ABNORMAL LOW (ref >60)
Glucose, Bld: 121 mg/dL — ABNORMAL HIGH (ref 70–99)
Potassium: 5 mmol/L (ref 3.5–5.1)
Sodium: 139 mmol/L (ref 135–145)
Total Bilirubin: 1.2 mg/dL (ref 0.3–1.2)
Total Protein: 7.9 g/dL (ref 6.5–8.1)

## 2021-11-18 LAB — CBC WITH DIFFERENTIAL/PLATELET
Abs Immature Granulocytes: 0.02 10*3/uL (ref 0.00–0.07)
Basophils Absolute: 0 10*3/uL (ref 0.0–0.1)
Basophils Relative: 1 %
Eosinophils Absolute: 0.1 10*3/uL (ref 0.0–0.5)
Eosinophils Relative: 1 %
HCT: 37.1 % (ref 36.0–46.0)
Hemoglobin: 11.7 g/dL — ABNORMAL LOW (ref 12.0–15.0)
Immature Granulocytes: 0 %
Lymphocytes Relative: 14 %
Lymphs Abs: 0.7 10*3/uL (ref 0.7–4.0)
MCH: 28.2 pg (ref 26.0–34.0)
MCHC: 31.5 g/dL (ref 30.0–36.0)
MCV: 89.4 fL (ref 80.0–100.0)
Monocytes Absolute: 0.4 10*3/uL (ref 0.1–1.0)
Monocytes Relative: 7 %
Neutro Abs: 4.1 10*3/uL (ref 1.7–7.7)
Neutrophils Relative %: 77 %
Platelets: 233 10*3/uL (ref 150–400)
RBC: 4.15 MIL/uL (ref 3.87–5.11)
RDW: 18.8 % — ABNORMAL HIGH (ref 11.5–15.5)
WBC: 5.3 10*3/uL (ref 4.0–10.5)
nRBC: 0 % (ref 0.0–0.2)

## 2021-11-18 MED ORDER — NITROGLYCERIN IN D5W 200-5 MCG/ML-% IV SOLN
0.0000 ug/min | INTRAVENOUS | Status: DC
  Administered 2021-11-18: 200 ug/min via INTRAVENOUS
  Filled 2021-11-18: qty 250

## 2021-11-18 MED ORDER — HYDROMORPHONE HCL 1 MG/ML IJ SOLN
1.0000 mg | Freq: Once | INTRAMUSCULAR | Status: AC
  Administered 2021-11-18: 1 mg via INTRAVENOUS
  Filled 2021-11-18: qty 1

## 2021-11-18 MED ORDER — CHLORHEXIDINE GLUCONATE CLOTH 2 % EX PADS: 6.0000 | MEDICATED_PAD | Freq: Every day | CUTANEOUS | Status: DC

## 2021-11-18 MED ORDER — DIPHENHYDRAMINE HCL 25 MG PO CAPS
50.0000 mg | ORAL_CAPSULE | Freq: Once | ORAL | Status: AC
Start: 2021-11-18 — End: 2021-11-18
  Administered 2021-11-18: 50 mg via ORAL
  Filled 2021-11-18: qty 2

## 2021-11-18 MED ORDER — HYDRALAZINE HCL 20 MG/ML IJ SOLN
10.0000 mg | Freq: Once | INTRAMUSCULAR | Status: AC
  Administered 2021-11-18: 10 mg via INTRAVENOUS
  Filled 2021-11-18: qty 1

## 2021-11-18 MED ORDER — DILTIAZEM HCL 25 MG/5ML IV SOLN
10.0000 mg | Freq: Once | INTRAVENOUS | Status: AC
  Administered 2021-11-18: 10 mg via INTRAVENOUS
  Filled 2021-11-18: qty 5

## 2021-11-18 MED ORDER — LIDOCAINE HCL (PF) 1 % IJ SOLN: 5.0000 mL | INTRAMUSCULAR | Status: DC | PRN

## 2021-11-18 MED ORDER — ANTICOAGULANT SODIUM CITRATE 4% (200MG/5ML) IV SOLN
5.0000 mL | Status: DC | PRN
  Filled 2021-11-18: qty 5

## 2021-11-18 MED ORDER — LIDOCAINE-PRILOCAINE 2.5-2.5 % EX CREA: 1.0000 | TOPICAL_CREAM | CUTANEOUS | Status: DC | PRN

## 2021-11-18 MED ORDER — HEPARIN SODIUM (PORCINE) 1000 UNIT/ML DIALYSIS
1000.0000 [IU] | INTRAMUSCULAR | Status: DC | PRN
  Administered 2021-11-18: 2000 [IU]
  Filled 2021-11-18 (×4): qty 1

## 2021-11-18 MED ORDER — PENTAFLUOROPROP-TETRAFLUOROETH EX AERO: 1.0000 | INHALATION_SPRAY | CUTANEOUS | Status: DC | PRN

## 2021-11-18 MED ORDER — ALTEPLASE 2 MG IJ SOLR: 2.0000 mg | Freq: Once | INTRAMUSCULAR | Status: DC | PRN

## 2021-11-18 MED ORDER — CLONIDINE HCL 0.1 MG PO TABS
0.1000 mg | ORAL_TABLET | Freq: Once | ORAL | Status: AC
  Administered 2021-11-18: 0.1 mg via ORAL
  Filled 2021-11-18: qty 1

## 2021-11-18 MED ORDER — DIPHENHYDRAMINE HCL 25 MG PO CAPS
25.0000 mg | ORAL_CAPSULE | Freq: Once | ORAL | Status: AC
Start: 2021-11-18 — End: 2021-11-18
  Administered 2021-11-18: 25 mg via ORAL
  Filled 2021-11-18: qty 1

## 2021-11-18 NOTE — ED Notes (Signed)
MD placed patient up for discharge. PIV removed and patient attempting to call her boyfriend.

## 2021-11-18 NOTE — Discharge Instructions (Addendum)
You need to get dialysis as scheduled.  Your heart enzymes are always elevated and this is improved from before.  You need to follow-up with your cardiologist.  See your primary care doctor and nephrologist as well  Return to ER if you have worse chest pain, trouble breathing

## 2021-11-18 NOTE — ED Notes (Signed)
Patient's HR and blood pressure improved. Patient notified she would be discharged. Encouraged to call for ride home.

## 2021-11-18 NOTE — ED Notes (Signed)
MD by to reassess heart rate. HR remains 120-130 irregular. MD decided patient cannot be discharged.

## 2021-11-18 NOTE — Progress Notes (Signed)
Paged and informed Dr Royce Macadamia pt given benadryl 25 mg po at 1417, pt with contd relentless restlessness and moaning r/t itching and scratching, md informed pt sbp 190s/140s rr 24, 30 minutes of HD session remaining.  Md defers meds that may sedate pt, no new MD orders given at this time. Will return pt back to ER as per initial plan per medical treatment team.

## 2021-11-18 NOTE — ED Notes (Addendum)
After Dilaudid administration pt noted to have 02 sat down to 91-92% on RA, pt placed on 2L [place].  Pt reports she did complete full HD treatment Saturday, pt is T,TH,Sat.

## 2021-11-18 NOTE — ED Triage Notes (Signed)
Pt arrived via [facility] from home Complaining of abdominal pain and SOB since yesterday that has been worsening since this morning.  Pt was here at [facility] 2 weeks ago for the same issue, however was sent back home with no diagnosis.  hx of HTN, renal failure, AFIB, CABG,   Last vitals  BP: 230/110  P: 102:  O2: 92%

## 2021-11-18 NOTE — ED Notes (Signed)
Patient given discharge instructions, all questions answered. Patient in possession of all belongings, directed to the discharge area  

## 2021-11-18 NOTE — ED Provider Notes (Addendum)
  Physical Exam  BP (!) 165/129 (BP Location: Left Arm)   Pulse (!) 120   Temp (!) 97.4 F (36.3 C) (Oral)   Resp (!) 25   Ht '5\' 3"'$  (1.6 m)   Wt 43.3 kg   SpO2 100%   BMI 16.91 kg/m   Physical Exam  Procedures  Procedures  Angiocath insertion Performed by: Wandra Arthurs  Consent: Verbal consent obtained. Risks and benefits: risks, benefits and alternatives were discussed Time out: Immediately prior to procedure a "time out" was called to verify the correct patient, procedure, equipment, support staff and site/side marked as required.  Preparation: Patient was prepped and draped in the usual sterile fashion.  Vein Location: L basilic  Ultrasound Guided  Gauge: 20 long   Normal blood return and flush without difficulty Patient tolerance: Patient tolerated the procedure well with no immediate complications.    ED Course / MDM   Clinical Course as of 11/18/21 1749  Tue Nov 18, 2021  1042 I discussed case with Dr. Royce Macadamia of nephrology.  They will plan to dialyze her and if better she could likely be discharged as she has chronic hypertensive urgency managed as an outpatient.  Troponin is similar to other values and likely represents hypertension and renal disease rather than ACS. [SG]    Clinical Course User Index [SG] Sherwood Gambler, MD   Medical Decision Making Patient is here for shortness of breath and patient has elevated troponin but was lower than previous.  Patient went up to dialysis and came back down.  I reassessed patient just now.  Patient was tachycardic initially but no longer tachycardic.  Heart rate is 80s in the room.  Patient has no oxygen requirement now.  Patient states that she feels fine now.  She wants to go home.  Stable for discharge   7:01 PM I was notified by the nurse that patient's heart rate went up to 130.  I went to bedside to reassess patient.  Patient appears to have irregular heart rate.  Has history of A-fib. Patient's heart rate is  between 1 20-1 40.  We will get repeat EKG and BMP and troponin.  We will give Cardizem bolus and likely will need to start a drip  8:35 PM Patient was given 1 dose of Cardizem 10 mg IV and also blood pressure medicine.  Her blood pressure is down to 130/100.  Heart rate is down to 80s to 90s.  Repeat blood work showed potassium is down to 3.4.  Troponin was 220 initially went up to 244.  However she is a dialysis patient and she has no chest pain and the troponin bump likely secondary to rapid A-fib.  Patient has no chest pain right now.  She would like to go home.  At this point I think she stable for discharge.  Gave strict return precautions  Problems Addressed: Atrial fibrillation, unspecified type [facility]): acute illness or injury Elevated troponin: acute illness or injury ESRD (end stage renal disease) on dialysis [facility]): chronic illness or injury Hyperkalemia: acute illness or injury  Amount and/or Complexity of Data Reviewed Labs: ordered. Decision-making details documented in ED Course. Radiology: ordered and independent interpretation performed. Decision-making details documented in ED Course. ECG/medicine tests: ordered and independent interpretation performed. Decision-making details documented in ED Course.  Risk Prescription drug management.          Drenda Freeze, MD 11/18/21 1750    Drenda Freeze, MD 11/18/21 [phone]

## 2021-11-18 NOTE — ED Provider Notes (Signed)
[facility] EMERGENCY DEPARTMENT Provider Note   CSN: [id number] Arrival date & time: 11/18/21  0645     History  Chief Complaint  Patient presents with   Shortness of Breath   Abdominal Pain    Monique Henderson is a 71 y.o. female.  HPI 71 year old female presents with sudden onset shortness of breath and chest pain.  Shortness of breath started at 6 AM all of a sudden.  She is having a cough with some white sputum as well.  She states she was fine last night.  She is also having abdominal pain and distention, sharp chest pain and feels like she had some transient leg swelling.  She goes to dialysis and last went on Saturday (today is Tuesday).  She reports she took all of her blood pressure medicines this morning.  No vomiting but some nausea.  She rates the pain as severe.  Patient has multiple comorbidities including aortic dissection status postrepair, atrial fibrillation on Eliquis, ESRD on dialysis, chronic obstructive lung disease.  She is not on home oxygen.  Home Medications Prior to Admission medications   Medication Sig Start Date End Date Taking? Authorizing Provider  albuterol (VENTOLIN HFA) 108 (90 Base) MCG/ACT inhaler Inhale 2 puffs into the lungs every 6 (six) hours as needed for wheezing or shortness of breath. Patient not taking: Reported on 10/29/2021 07/30/20   Maximiano Coss, NP  amLODipine (NORVASC) 10 MG tablet Take 1 tablet (10 mg total) by mouth daily. 02/14/21   Maximiano Coss, NP  apixaban (ELIQUIS) 2.5 MG TABS tablet Take 1 tablet (2.5 mg total) by mouth 2 (two) times daily. 08/24/21   Shary Key, DO  carvedilol (COREG) 3.125 MG tablet Take 1 tablet (3.125 mg total) by mouth 2 (two) times daily with a meal. 08/24/21   Paige, Weldon Picking, DO  cetirizine (ZYRTEC) 10 MG tablet Take 10 mg by mouth daily as needed for allergies.    [provider]  cloNIDine (CATAPRES - DOSED IN MG/24 HR) 0.1 mg/24hr patch Place 1 patch (0.1 mg  total) onto the skin every Saturday. Patient not taking: Reported on 10/29/2021 06/14/21   Caren Griffins, MD  hydrALAZINE (APRESOLINE) 50 MG tablet Take 1 tablet (50 mg total) by mouth 3 (three) times daily. 06/21/21 10/29/21  Nolberto Hanlon, MD  ibuprofen (ADVIL) 200 MG tablet Take 600 mg by mouth every 6 (six) hours as needed for headache or moderate pain.    [provider]  isosorbide mononitrate (IMDUR) 30 MG 24 hr tablet Take 1 tablet (30 mg total) by mouth daily. 08/17/21   Shary Key, DO  loperamide (IMODIUM) 2 MG capsule Take 2 mg by mouth as needed for diarrhea or loose stools. 07/15/20   [provider]  losartan (COZAAR) 100 MG tablet Take 1 tablet (100 mg total) by mouth daily. 02/14/21   Maximiano Coss, NP  mirtazapine (REMERON) 7.5 MG tablet Take 1 tablet (7.5 mg total) by mouth at bedtime. Patient taking differently: Take 7.5 mg by mouth at bedtime as needed (sleep). 07/15/20   Maximiano Coss, NP  [facility] 4 MG/0.1ML LIQD nasal spray kit Place 1 spray into the nose as needed for opioid reversal. If no response within 3 minutes then repeat once in the other nostril.  Call 911. 05/15/20   [provider]  pregabalin (LYRICA) 50 MG capsule Take 100 mg by mouth at bedtime. 05/07/21   [provider]  rosuvastatin (CRESTOR) 10 MG tablet Take 1  tablet (10 mg total) by mouth daily. Patient not taking: Reported on 10/29/2021 02/14/21 09/15/21  Maximiano Coss, NP  sevelamer carbonate (RENVELA) 2.4 g PACK Take 2.4 g by mouth 3 (three) times daily with meals. Patient not taking: Reported on 10/29/2021 05/15/21   [provider]  sucroferric oxyhydroxide (VELPHORO) 500 MG chewable tablet Chew 1,000 mg by mouth 3 (three) times daily with meals.    [provider]  torsemide (DEMADEX) 20 MG tablet Take 40 mg by mouth daily.    [provider]  VITAMIN D PO Take 5,000 Units by mouth daily.    [provider]      Allergies     Shrimp [shellfish allergy], Bactroban [mupirocin], Vicodin [hydrocodone-acetaminophen], Eliquis [apixaban], Lisinopril, and Tylenol [acetaminophen]    Review of Systems   Review of Systems  Constitutional:  Negative for fever.  Respiratory:  Positive for cough and shortness of breath.   Cardiovascular:  Positive for chest pain and leg swelling.  Gastrointestinal:  Positive for abdominal pain and nausea. Negative for vomiting.    Physical Exam Updated Vital Signs BP (!) 189/117   Pulse 92   Temp 98.4 F (36.9 C) (Oral)   Resp 15   Ht 5' 3"  (1.6 m)   Wt 46.3 kg   SpO2 100%   BMI 18.08 kg/m  Physical Exam Vitals and nursing note reviewed.  Constitutional:      Appearance: She is well-developed. She is not diaphoretic.  HENT:     Head: Normocephalic and atraumatic.  Cardiovascular:     Rate and Rhythm: Regular rhythm. Tachycardia present.     Heart sounds: Murmur heard.  Pulmonary:     Effort: Pulmonary effort is normal. Tachypnea present.     Breath sounds: Examination of the left-middle field reveals decreased breath sounds. Examination of the right-lower field reveals decreased breath sounds. Examination of the left-lower field reveals decreased breath sounds. Decreased breath sounds present. No wheezing.  Chest:     Chest wall: Tenderness (diffuse - reproduces her pain) present.  Abdominal:     Palpations: Abdomen is soft.     Tenderness: There is abdominal tenderness.  Musculoskeletal:     Right lower leg: No edema.     Left lower leg: No edema.  Skin:    General: Skin is warm and dry.  Neurological:     Mental Status: She is alert.     ED Results / Procedures / Treatments   Labs (all labs ordered are listed, but only abnormal results are displayed) Labs Reviewed  COMPREHENSIVE METABOLIC PANEL - Abnormal; Notable for the following components:      Result Value   Chloride 97 (*)    Glucose, Bld 121 (*)    BUN 46 (*)    Creatinine, Ser 9.49 (*)    Calcium  10.7 (*)    GFR, Estimated 4 (*)    Anion gap 19 (*)    All other components within normal limits  CBC WITH DIFFERENTIAL/PLATELET - Abnormal; Notable for the following components:   Hemoglobin 11.7 (*)    RDW 18.8 (*)    All other components within normal limits  I-STAT CHEM 8, ED - Abnormal; Notable for the following components:   Potassium 5.3 (*)    BUN 58 (*)    Creatinine, Ser 10.60 (*)    Glucose, Bld 119 (*)    Calcium, Ion 1.14 (*)    All other components within normal limits  TROPONIN I (HIGH SENSITIVITY) -  Abnormal; Notable for the following components:   Troponin I (High Sensitivity) 220 (*)    All other components within normal limits  RESP PANEL BY RT-PCR (FLU A&B, COVID) ARPGX2  HEPATITIS B SURFACE ANTIGEN  HEPATITIS B SURFACE ANTIBODY,QUALITATIVE  HEPATITIS B SURFACE ANTIBODY, QUANTITATIVE  HEPATITIS B CORE ANTIBODY, TOTAL  HEPATITIS C ANTIBODY  TROPONIN I (HIGH SENSITIVITY)    EKG EKG Interpretation  Date/Time:  Tuesday November 18 2021 06:49:58 EDT Ventricular Rate:  110 PR Interval:  157 QRS Duration: 135 QT Interval:  363 QTC Calculation: 492 R Axis:   113 Text Interpretation: Sinus tachycardia Ventricular preexcitation(WPW) no significant change since May 2023 Confirmed by Sherwood Gambler [phone]) on 11/18/2021 6:58:38 AM  Radiology CT ABDOMEN PELVIS WO CONTRAST  Result Date: 11/18/2021 CLINICAL DATA:  Nonlocalized abdominal pain. EXAM: CT ABDOMEN AND PELVIS WITHOUT CONTRAST TECHNIQUE: Multidetector CT imaging of the abdomen and pelvis was performed following the standard protocol without IV contrast. RADIATION DOSE REDUCTION: This exam was performed according to the departmental dose-optimization program which includes automated exposure control, adjustment of the mA and/or kV according to patient size and/or use of iterative reconstruction technique. COMPARISON:  CTA abdomen and pelvis 06/08/2019 FINDINGS: Lower chest: Heart is enlarged. Small to moderate  right pleural effusion. Hepatobiliary: No suspicious focal abnormality in the liver on this study without intravenous contrast. Gallbladder not well seen but no evidence for calcified gallstones. No intrahepatic or extrahepatic biliary dilation. Pancreas: No focal mass lesion. No dilatation of the main duct. No intraparenchymal cyst. No peripancreatic edema. Spleen: No splenomegaly. No focal mass lesion. Adrenals/Urinary Tract: No adrenal nodule or mass. No hydronephrosis. No definite hydroureter. The urinary bladder appears normal for the degree of distention. Stomach/Bowel: Stomach is nondilated. No small bowel or colonic dilatation. Neither the terminal ileum nor the appendix are clearly demonstrated. Colon is nondilated Vascular/Lymphatic: There is moderate atherosclerotic calcification of the abdominal aorta without aneurysm. No discernible lymphadenopathy in the abdomen or pelvis. Reproductive: Calcified fibroid seen in the uterus. There is no adnexal mass. Other: Moderate volume ascites with diffuse mesenteric edema. Musculoskeletal: Diffuse body wall edema. Posterolateral left lumbar hernia containing only fat, similar to prior (image 32/3). Patient has a second more anterior left-sided lateral wall hernia extending to the plane between the internal and external oblique muscles (image 31/3). This contains a short segment of descending colon without complicating features. No worrisome lytic or sclerotic osseous abnormality. Status post lumbar fusion. IMPRESSION: 1. Markedly limited study due to lack of oral and intravenous contrast and the associated diffuse mesenteric/body wall edema and intraperitoneal free fluid. Within this limitation, no acute findings in the abdomen or pelvis. 2. Small to moderate right pleural effusion. 3. Stable appearance of left lateral wall hernias. 4. Aortic Atherosclerosis (ICD10-I70.0). Electronically Signed   By: Misty Stanley M.D.   On: 11/18/2021 10:26   DG Chest Portable 1  View  Result Date: 11/18/2021 CLINICAL DATA:  acute dyspnea EXAM: PORTABLE CHEST 1 VIEW COMPARISON:  Oct 29, 2021 FINDINGS: Again seen is a right transjugular tunneled dialysis catheter with its tip in the proximal right atrium. Sternotomy wires. Cardiomegaly. Mild central pulmonary vascular congestion without significant interval change. Blunting of the bilateral CP angles suspicious for small pleural effusion. No consolidation or atelectasis. IMPRESSION: There has been no significant interval change in the cardiomegaly and mild pulmonary vascular congestion. Likely small bilateral pleural effusion. Electronically Signed   By: Frazier Richards M.D.   On: 11/18/2021 08:01    Procedures .Critical Care  Performed by: Sherwood Gambler, MD Authorized by: Sherwood Gambler, MD   Critical care provider statement:    Critical care time (minutes):  35   Critical care time was exclusive of:  Separately billable procedures and treating other patients   Critical care was necessary to treat or prevent imminent or life-threatening deterioration of the following conditions:  Respiratory failure   Critical care was time spent personally by me on the following activities:  Development of treatment plan with patient or surrogate, discussions with consultants, evaluation of patient's response to treatment, examination of patient, ordering and review of laboratory studies, ordering and review of radiographic studies, ordering and performing treatments and interventions, pulse oximetry, re-evaluation of patient's condition and review of old charts     Medications Ordered in ED Medications  Chlorhexidine Gluconate Cloth 2 % PADS 6 each (has no administration in time range)  HYDROmorphone (DILAUDID) injection 1 mg (1 mg Intravenous Given 11/18/21 0728)  diphenhydrAMINE (BENADRYL) capsule 50 mg (50 mg Oral Given 11/18/21 1038)  diphenhydrAMINE (BENADRYL) capsule 25 mg (25 mg Oral Given 11/18/21 1417)    ED Course/ Medical  Decision Making/ A&P Clinical Course as of 11/18/21 1511  Tue Nov 18, 2021  1042 I discussed case with Dr. Royce Macadamia of nephrology.  They will plan to dialyze her and if better she could likely be discharged as she has chronic hypertensive urgency managed as an outpatient.  Troponin is similar to other values and likely represents hypertension and renal disease rather than ACS. [SG]    Clinical Course User Index [SG] Sherwood Gambler, MD                           Medical Decision Making Amount and/or Complexity of Data Reviewed External Data Reviewed: notes. Labs: ordered.    Details: Troponin of 220 similar to other values.  Creatinine indicative of her ESRD.  Potassium is okay. Radiology: ordered and independent interpretation performed.    Details: Mild edema on chest x-ray.  CT shows edema as well ECG/medicine tests: ordered and independent interpretation performed.    Details: Tachycardic, similar to other ECGs  Risk Prescription drug management.   Patient presents with recurrent dyspnea and hypertensive crisis.  Was put on nitroglycerin.  Shortness of breath is not so bad to need BiPAP after being given some pain control.  However her chest pain is probably chest wall given the reproducible nature.  She does have significant comorbidities but I think PE or dissection or ACS is less likely.  This is similar to other presentations on chart review.  I discussed with Dr. Royce Macadamia as above and she will go to dialysis.  If appropriate she will be able to be discharged but will need reeval once back in the ER.        Final Clinical Impression(s) / ED Diagnoses Final diagnoses:  None    Rx / DC Orders ED Discharge Orders     None         Sherwood Gambler, MD 11/18/21 1513

## 2021-11-20 DIAGNOSIS — Z992 Dependence on renal dialysis: Secondary | ICD-10-CM | POA: Diagnosis not present

## 2021-11-20 DIAGNOSIS — N2581 Secondary hyperparathyroidism of renal origin: Secondary | ICD-10-CM | POA: Diagnosis not present

## 2021-11-20 DIAGNOSIS — N186 End stage renal disease: Secondary | ICD-10-CM | POA: Diagnosis not present

## 2021-11-22 DIAGNOSIS — N186 End stage renal disease: Secondary | ICD-10-CM | POA: Diagnosis not present

## 2021-11-22 DIAGNOSIS — Z992 Dependence on renal dialysis: Secondary | ICD-10-CM | POA: Diagnosis not present

## 2021-11-22 DIAGNOSIS — N2581 Secondary hyperparathyroidism of renal origin: Secondary | ICD-10-CM | POA: Diagnosis not present

## 2021-11-27 DIAGNOSIS — N2581 Secondary hyperparathyroidism of renal origin: Secondary | ICD-10-CM | POA: Diagnosis not present

## 2021-11-27 DIAGNOSIS — Z992 Dependence on renal dialysis: Secondary | ICD-10-CM | POA: Diagnosis not present

## 2021-11-27 DIAGNOSIS — N186 End stage renal disease: Secondary | ICD-10-CM | POA: Diagnosis not present

## 2021-11-28 DIAGNOSIS — Z992 Dependence on renal dialysis: Secondary | ICD-10-CM | POA: Diagnosis not present

## 2021-11-28 DIAGNOSIS — I129 Hypertensive chronic kidney disease with stage 1 through stage 4 chronic kidney disease, or unspecified chronic kidney disease: Secondary | ICD-10-CM | POA: Diagnosis not present

## 2021-11-28 DIAGNOSIS — N186 End stage renal disease: Secondary | ICD-10-CM | POA: Diagnosis not present

## 2021-11-29 DIAGNOSIS — N186 End stage renal disease: Secondary | ICD-10-CM | POA: Diagnosis not present

## 2021-11-29 DIAGNOSIS — Z992 Dependence on renal dialysis: Secondary | ICD-10-CM | POA: Diagnosis not present

## 2021-11-29 DIAGNOSIS — N2581 Secondary hyperparathyroidism of renal origin: Secondary | ICD-10-CM | POA: Diagnosis not present

## 2021-12-02 DIAGNOSIS — N2581 Secondary hyperparathyroidism of renal origin: Secondary | ICD-10-CM | POA: Diagnosis not present

## 2021-12-02 DIAGNOSIS — Z992 Dependence on renal dialysis: Secondary | ICD-10-CM | POA: Diagnosis not present

## 2021-12-02 DIAGNOSIS — N186 End stage renal disease: Secondary | ICD-10-CM | POA: Diagnosis not present

## 2021-12-04 DIAGNOSIS — N186 End stage renal disease: Secondary | ICD-10-CM | POA: Diagnosis not present

## 2021-12-04 DIAGNOSIS — N2581 Secondary hyperparathyroidism of renal origin: Secondary | ICD-10-CM | POA: Diagnosis not present

## 2021-12-04 DIAGNOSIS — Z992 Dependence on renal dialysis: Secondary | ICD-10-CM | POA: Diagnosis not present

## 2021-12-07 ENCOUNTER — Emergency Department (HOSPITAL_COMMUNITY): Payer: Medicare HMO

## 2021-12-07 ENCOUNTER — Encounter (HOSPITAL_COMMUNITY): Payer: Self-pay | Admitting: Emergency Medicine

## 2021-12-07 ENCOUNTER — Observation Stay (HOSPITAL_COMMUNITY)
Admission: EM | Admit: 2021-12-07 | Discharge: 2021-12-08 | Disposition: A | Discharge status: Home / Self Care | Payer: Medicare HMO | Attending: Obstetrics and Gynecology | Admitting: Obstetrics and Gynecology

## 2021-12-07 ENCOUNTER — Other Ambulatory Visit: Payer: Self-pay

## 2021-12-07 DIAGNOSIS — I161 Hypertensive emergency: Secondary | ICD-10-CM | POA: Diagnosis not present

## 2021-12-07 DIAGNOSIS — E8779 Other fluid overload: Secondary | ICD-10-CM | POA: Diagnosis not present

## 2021-12-07 DIAGNOSIS — Z79899 Other long term (current) drug therapy: Secondary | ICD-10-CM | POA: Insufficient documentation

## 2021-12-07 DIAGNOSIS — R109 Unspecified abdominal pain: Secondary | ICD-10-CM | POA: Diagnosis not present

## 2021-12-07 DIAGNOSIS — I5023 Acute on chronic systolic (congestive) heart failure: Secondary | ICD-10-CM | POA: Diagnosis not present

## 2021-12-07 DIAGNOSIS — J90 Pleural effusion, not elsewhere classified: Secondary | ICD-10-CM | POA: Diagnosis not present

## 2021-12-07 DIAGNOSIS — Z7901 Long term (current) use of anticoagulants: Secondary | ICD-10-CM | POA: Diagnosis not present

## 2021-12-07 DIAGNOSIS — J811 Chronic pulmonary edema: Secondary | ICD-10-CM | POA: Diagnosis not present

## 2021-12-07 DIAGNOSIS — Z853 Personal history of malignant neoplasm of breast: Secondary | ICD-10-CM | POA: Insufficient documentation

## 2021-12-07 DIAGNOSIS — N186 End stage renal disease: Secondary | ICD-10-CM | POA: Diagnosis not present

## 2021-12-07 DIAGNOSIS — I10 Essential (primary) hypertension: Secondary | ICD-10-CM

## 2021-12-07 DIAGNOSIS — R1010 Upper abdominal pain, unspecified: Secondary | ICD-10-CM | POA: Diagnosis not present

## 2021-12-07 DIAGNOSIS — D631 Anemia in chronic kidney disease: Secondary | ICD-10-CM | POA: Diagnosis not present

## 2021-12-07 DIAGNOSIS — Z452 Encounter for adjustment and management of vascular access device: Secondary | ICD-10-CM | POA: Diagnosis not present

## 2021-12-07 DIAGNOSIS — R6889 Other general symptoms and signs: Secondary | ICD-10-CM | POA: Diagnosis not present

## 2021-12-07 DIAGNOSIS — F17200 Nicotine dependence, unspecified, uncomplicated: Secondary | ICD-10-CM | POA: Diagnosis present

## 2021-12-07 DIAGNOSIS — E872 Acidosis, unspecified: Secondary | ICD-10-CM | POA: Diagnosis present

## 2021-12-07 DIAGNOSIS — I2510 Atherosclerotic heart disease of native coronary artery without angina pectoris: Secondary | ICD-10-CM | POA: Diagnosis not present

## 2021-12-07 DIAGNOSIS — I774 Celiac artery compression syndrome: Secondary | ICD-10-CM | POA: Diagnosis not present

## 2021-12-07 DIAGNOSIS — I480 Paroxysmal atrial fibrillation: Secondary | ICD-10-CM | POA: Diagnosis present

## 2021-12-07 DIAGNOSIS — E8770 Fluid overload, unspecified: Secondary | ICD-10-CM | POA: Diagnosis present

## 2021-12-07 DIAGNOSIS — Z87891 Personal history of nicotine dependence: Secondary | ICD-10-CM | POA: Insufficient documentation

## 2021-12-07 DIAGNOSIS — I120 Hypertensive chronic kidney disease with stage 5 chronic kidney disease or end stage renal disease: Secondary | ICD-10-CM | POA: Diagnosis not present

## 2021-12-07 DIAGNOSIS — J449 Chronic obstructive pulmonary disease, unspecified: Secondary | ICD-10-CM | POA: Diagnosis not present

## 2021-12-07 DIAGNOSIS — N250 Renal osteodystrophy: Secondary | ICD-10-CM | POA: Diagnosis not present

## 2021-12-07 DIAGNOSIS — Z992 Dependence on renal dialysis: Secondary | ICD-10-CM | POA: Diagnosis not present

## 2021-12-07 DIAGNOSIS — I4510 Unspecified right bundle-branch block: Secondary | ICD-10-CM | POA: Diagnosis not present

## 2021-12-07 DIAGNOSIS — K7689 Other specified diseases of liver: Secondary | ICD-10-CM | POA: Diagnosis not present

## 2021-12-07 DIAGNOSIS — Z743 Need for continuous supervision: Secondary | ICD-10-CM | POA: Diagnosis not present

## 2021-12-07 DIAGNOSIS — R7989 Other specified abnormal findings of blood chemistry: Secondary | ICD-10-CM | POA: Diagnosis present

## 2021-12-07 DIAGNOSIS — I132 Hypertensive heart and chronic kidney disease with heart failure and with stage 5 chronic kidney disease, or end stage renal disease: Secondary | ICD-10-CM | POA: Diagnosis not present

## 2021-12-07 DIAGNOSIS — R0602 Shortness of breath: Secondary | ICD-10-CM | POA: Diagnosis not present

## 2021-12-07 DIAGNOSIS — R079 Chest pain, unspecified: Secondary | ICD-10-CM | POA: Diagnosis not present

## 2021-12-07 DIAGNOSIS — I714 Abdominal aortic aneurysm, without rupture, unspecified: Secondary | ICD-10-CM | POA: Diagnosis not present

## 2021-12-07 DIAGNOSIS — R778 Other specified abnormalities of plasma proteins: Secondary | ICD-10-CM | POA: Diagnosis not present

## 2021-12-07 DIAGNOSIS — Z8619 Personal history of other infectious and parasitic diseases: Secondary | ICD-10-CM

## 2021-12-07 LAB — COMPREHENSIVE METABOLIC PANEL
ALT: 13 U/L (ref 0–44)
AST: 15 U/L (ref 15–41)
Albumin: 3.7 g/dL (ref 3.5–5.0)
Alkaline Phosphatase: 89 U/L (ref 38–126)
Anion gap: 16 — ABNORMAL HIGH (ref 5–15)
BUN: 59 mg/dL — ABNORMAL HIGH (ref 8–23)
CO2: 23 mmol/L (ref 22–32)
Calcium: 10 mg/dL (ref 8.9–10.3)
Chloride: 102 mmol/L (ref 98–111)
Creatinine, Ser: 10.98 mg/dL — ABNORMAL HIGH (ref 0.44–1.00)
GFR, Estimated: 3 mL/min — ABNORMAL LOW (ref >60)
Glucose, Bld: 149 mg/dL — ABNORMAL HIGH (ref 70–99)
Potassium: 4.4 mmol/L (ref 3.5–5.1)
Sodium: 141 mmol/L (ref 135–145)
Total Bilirubin: 0.9 mg/dL (ref 0.3–1.2)
Total Protein: 7 g/dL (ref 6.5–8.1)

## 2021-12-07 LAB — LIPASE, BLOOD: Lipase: 84 U/L — ABNORMAL HIGH (ref 11–51)

## 2021-12-07 LAB — CBC WITH DIFFERENTIAL/PLATELET
Abs Immature Granulocytes: 0.02 10*3/uL (ref 0.00–0.07)
Basophils Absolute: 0.1 10*3/uL (ref 0.0–0.1)
Basophils Relative: 1 %
Eosinophils Absolute: 0.4 10*3/uL (ref 0.0–0.5)
Eosinophils Relative: 7 %
HCT: 32.2 % — ABNORMAL LOW (ref 36.0–46.0)
Hemoglobin: 10.1 g/dL — ABNORMAL LOW (ref 12.0–15.0)
Immature Granulocytes: 0 %
Lymphocytes Relative: 17 %
Lymphs Abs: 0.9 10*3/uL (ref 0.7–4.0)
MCH: 28.7 pg (ref 26.0–34.0)
MCHC: 31.4 g/dL (ref 30.0–36.0)
MCV: 91.5 fL (ref 80.0–100.0)
Monocytes Absolute: 0.5 10*3/uL (ref 0.1–1.0)
Monocytes Relative: 10 %
Neutro Abs: 3.5 10*3/uL (ref 1.7–7.7)
Neutrophils Relative %: 65 %
Platelets: 229 10*3/uL (ref 150–400)
RBC: 3.52 MIL/uL — ABNORMAL LOW (ref 3.87–5.11)
RDW: 20.6 % — ABNORMAL HIGH (ref 11.5–15.5)
WBC: 5.3 10*3/uL (ref 4.0–10.5)
nRBC: 0 % (ref 0.0–0.2)

## 2021-12-07 LAB — TROPONIN I (HIGH SENSITIVITY): Troponin I (High Sensitivity): 289 ng/L (ref <18)

## 2021-12-07 LAB — I-STAT CHEM 8, ED
BUN: 53 mg/dL — ABNORMAL HIGH (ref 8–23)
Calcium, Ion: 1.13 mmol/L — ABNORMAL LOW (ref 1.15–1.40)
Chloride: 104 mmol/L (ref 98–111)
Creatinine, Ser: 11.9 mg/dL — ABNORMAL HIGH (ref 0.44–1.00)
Glucose, Bld: 143 mg/dL — ABNORMAL HIGH (ref 70–99)
HCT: 34 % — ABNORMAL LOW (ref 36.0–46.0)
Hemoglobin: 11.6 g/dL — ABNORMAL LOW (ref 12.0–15.0)
Potassium: 4.2 mmol/L (ref 3.5–5.1)
Sodium: 141 mmol/L (ref 135–145)
TCO2: 22 mmol/L (ref 22–32)

## 2021-12-07 MED ORDER — APIXABAN 2.5 MG PO TABS
2.5000 mg | ORAL_TABLET | Freq: Two times a day (BID) | ORAL | Status: DC
  Administered 2021-12-07: 2.5 mg via ORAL
  Filled 2021-12-07 (×2): qty 1

## 2021-12-07 MED ORDER — ISOSORBIDE MONONITRATE ER 30 MG PO TB24: 30.0000 mg | ORAL_TABLET | Freq: Every day | ORAL | Status: DC

## 2021-12-07 MED ORDER — HYDRALAZINE HCL 20 MG/ML IJ SOLN
10.0000 mg | Freq: Once | INTRAMUSCULAR | Status: AC
  Administered 2021-12-07: 10 mg via INTRAVENOUS
  Filled 2021-12-07: qty 1

## 2021-12-07 MED ORDER — AMLODIPINE BESYLATE 5 MG PO TABS
10.0000 mg | ORAL_TABLET | Freq: Once | ORAL | Status: AC
Start: 2021-12-07 — End: 2021-12-07
  Administered 2021-12-07: 10 mg via ORAL
  Filled 2021-12-07: qty 2

## 2021-12-07 MED ORDER — CARVEDILOL 3.125 MG PO TABS: 3.1250 mg | ORAL_TABLET | Freq: Two times a day (BID) | ORAL | Status: DC

## 2021-12-07 MED ORDER — HYDRALAZINE HCL 20 MG/ML IJ SOLN
10.0000 mg | INTRAMUSCULAR | Status: DC | PRN
  Administered 2021-12-07: 20 mg via INTRAVENOUS
  Filled 2021-12-07: qty 1

## 2021-12-07 MED ORDER — IOHEXOL 350 MG/ML SOLN
50.0000 mL | Freq: Once | INTRAVENOUS | Status: AC | PRN
  Administered 2021-12-07: 50 mL via INTRAVENOUS

## 2021-12-07 MED ORDER — AMLODIPINE BESYLATE 5 MG PO TABS: 10.0000 mg | ORAL_TABLET | Freq: Every day | ORAL | Status: DC

## 2021-12-07 MED ORDER — ACETAMINOPHEN 650 MG RE SUPP: 650.0000 mg | Freq: Four times a day (QID) | RECTAL | Status: DC | PRN

## 2021-12-07 MED ORDER — LOSARTAN POTASSIUM 50 MG PO TABS: 100.0000 mg | ORAL_TABLET | Freq: Every day | ORAL | Status: DC

## 2021-12-07 MED ORDER — MORPHINE SULFATE (PF) 4 MG/ML IV SOLN
4.0000 mg | Freq: Once | INTRAVENOUS | Status: AC
  Administered 2021-12-07: 4 mg via INTRAVENOUS
  Filled 2021-12-07: qty 1

## 2021-12-07 MED ORDER — ACETAMINOPHEN 325 MG PO TABS: 650.0000 mg | ORAL_TABLET | Freq: Four times a day (QID) | ORAL | Status: DC | PRN

## 2021-12-07 MED ORDER — IOHEXOL 350 MG/ML SOLN
75.0000 mL | Freq: Once | INTRAVENOUS | Status: AC | PRN
Start: 2021-12-07 — End: 2021-12-07
  Administered 2021-12-07: 75 mL via INTRAVENOUS

## 2021-12-07 MED ORDER — LOSARTAN POTASSIUM 50 MG PO TABS
100.0000 mg | ORAL_TABLET | Freq: Once | ORAL | Status: AC
  Administered 2021-12-07: 100 mg via ORAL
  Filled 2021-12-07: qty 2

## 2021-12-07 NOTE — Assessment & Plan Note (Addendum)
Mild with AG 16, Due to missed dialysis and uremia. Plan: dialysis ASAP

## 2021-12-07 NOTE — ED Notes (Signed)
EDP / Charge nurse notified on patient's elevated Troponin result.

## 2021-12-07 NOTE — Assessment & Plan Note (Signed)
Cont eliquis 

## 2021-12-07 NOTE — H&P (Signed)
History and Physical    Patient: Monique Henderson WGN:[id number] DOB: 1950-06-12 DOA: 12/07/2021 DOS: the patient was seen and examined on 12/07/2021 PCP: Pcp, No  Patient coming from: Home  Chief Complaint:  Chief Complaint  Patient presents with   Missed Hemodialysis / ABdominal Pain/SOB    HPI: Monique Henderson is a 71 y.o. female with medical history significant of ESRD on Dialysis TTS, last dialysis on thurs, missed dialysis yesterday; malignant HTN, prior aortic dissection, AF on eliquis.  Pt with h/o admit just last month for HTN urgency / emergency in setting of missed dialysis.  Pt with h/o aortic aneurysm and acute dissection in 2020 s/p surgical repair.  Pt with sudden onset of SOB, CP, and abd pain today.  Elevated trop.  Pt admitted 1 month ago with similar presentation.  BP very hypertensive on presentation to ED today 217/151!  Pt given PO amlodipine and losartan (home meds) as well as IV hydralazine.  CT today doesn't show evidence of acute dissection. Review of Systems: As mentioned in the history of present illness. All other systems reviewed and are negative. Past Medical History:  Diagnosis Date   AF (paroxysmal atrial fibrillation) ([place]) 05/29/2019   on Coumadin   Aortic atherosclerosis ([place]) 07/05/2019   Aortic dissection ([place]) 04/04/2019   s/p repair   Bone spur 2008   Right calcaneal foot spur   Breast cancer ([place]) 2004   Ductal carcinoma in situ of the left breast; S/P left partial mastectomy 02/26/2003; S/P re-excision of cranial and lateral margins11/18/2004.radiation   Cerebral thrombosis with cerebral infarction 05/22/2019   Chronic low back pain 06/22/2016   Chronic obstructive lung disease ([place]) 01/16/2017   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 Autumn Messing; S/P re-excision of superior and inferior margins 10/27/2012.    ESRD on hemodialysis ([place]) 05/29/2019   Essential hypertension  09/16/2006   GERD 09/16/2006   Hepatitis C    treated and RNA confirmed not detectable 01/2017   Insomnia 03/14/2015   Malnutrition of moderate degree 05/19/2019   Non compliance with medical treatment 12/04/2017   Normocytic anemia    With thrombocytosis   Osteoarthritis    Right ureteral stone 2002   S/P lumbar spinal fusion 01/18/2014   S/P lumbar decompressive laminectomy, fusion, and plating for lumbar spinal stenosis on 05/27/2009 by Dr. Eustace Moore.  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 Eustace Moore.     Tobacco use disorder 04/19/2009   Uterine fibroid    Wears dentures    top   Past Surgical History:  Procedure Laterality Date   ANTERIOR LAT LUMBAR FUSION N/A 01/18/2014   Procedure: ANTERIOR LATERAL LUMBAR FUSION LUMBAR TWO-THREE;  Surgeon: Eustace Moore, MD;  Location: [facility];  Service: Neurosurgery;  Laterality: N/A;   ANTERIOR LUMBAR FUSION  01/18/2014   AV FISTULA PLACEMENT Left 04/20/2019   Procedure: ARTERIOVENOUS (AV) FISTULA CREATION;  Surgeon: [name], MD;  Location: [facility];  Service: Vascular;  Laterality: Left;   BACK SURGERY     BREAST LUMPECTOMY Left 01/2003   BREAST LUMPECTOMY Right 2014   BREAST LUMPECTOMY WITH NEEDLE LOCALIZATION AND AXILLARY SENTINEL LYMPH NODE BX Right 10/13/2012   Procedure: BREAST LUMPECTOMY WITH NEEDLE LOCALIZATION;  Surgeon: Luella Cook III,  MD;  Location: [facility];  Service: General;  Laterality: Right;  Right breast wire localized lumpectomy   INSERTION OF DIALYSIS CATHETER Right 04/20/2019   Procedure: INSERTION OF DIALYSIS CATHETER, right internal jugular;  Surgeon: [name], MD;  Location: [facility];  Service: Vascular;  Laterality: Right;   INSERTION OF DIALYSIS CATHETER Right  09/24/2020   Procedure: INSERTION OF TUNNELED DIALYSIS CATHETER;  Surgeon: Rosetta Posner, MD;  Location: [facility];  Service: Vascular;  Laterality: Right;   IR FLUORO GUIDE CV LINE RIGHT  09/22/2020   IR THORACENTESIS ASP PLEURAL SPACE W/IMG GUIDE  05/19/2019   IR US GUIDE VASC ACCESS LEFT  09/22/2020   IR US GUIDE VASC ACCESS RIGHT  09/22/2020   IR VENOCAVAGRAM SVC  09/22/2020   LAMINECTOMY  05/27/2009   Lumbar decompressive laminectomy, fusion and plating for lumbar spinal stensosis   LIGATION OF ARTERIOVENOUS  FISTULA Left 09/24/2020   Procedure: LIGATION OF LEFT ARM ARTERIOVENOUS  FISTULA;  Surgeon: Rosetta Posner, MD;  Location: [facility];  Service: Vascular;  Laterality: Left;   LUMBAR LAMINECTOMY/DECOMPRESSION MICRODISCECTOMY Left 03/23/2013   Procedure: LUMBAR LAMINECTOMY/DECOMPRESSION MICRODISCECTOMY 1 LEVEL;  Surgeon: Eustace Moore, MD;  Location: [facility];  Service: Neurosurgery;  Laterality: Left;  LUMBAR LAMINECTOMY/DECOMPRESSION MICRODISCECTOMY 1 LEVEL   MASTECTOMY, PARTIAL Left 02/26/2003   ; S/P re-excision of cranial and lateral margins 04/19/2003.    RE-EXCISION OF BREAST CANCER,SUPERIOR MARGINS Right 10/27/2012   Procedure: RE-EXCISION OF BREAST CANCER,SUPERIOR and inferior MARGINS;  Surgeon: [name], MD;  Location: [facility];  Service: General;  Laterality: Right;   RE-EXCISION OF BREAST LUMPECTOMY Left 04/2003   TEE WITHOUT CARDIOVERSION N/A 04/04/2019   Procedure: Transesophageal Echocardiogram (Tee);  Surgeon: Wonda Olds, MD;  Location: [facility];  Service: Open Heart Surgery;  Laterality: N/A;   THORACIC AORTIC ANEURYSM REPAIR N/A 04/04/2019   Procedure: THORACIC ASCENDING ANEURYSM REPAIR (AAA)  USING 28 MM X 30 CM HEMASHIELD PLATINUM VASCULAR GRAFT;  Surgeon: Wonda Olds, MD;  Location: [facility];  Service: Open Heart Surgery;  Laterality: N/A;   Social History:  reports that she quit smoking about 2 years ago. Her smoking use included cigarettes. She has a 11.00 pack-year  smoking history. She has never used smokeless tobacco. She reports that she does not currently use alcohol after a past usage of about 2.0 standard drinks of alcohol per week. She reports that she does not currently use drugs after having used the following drugs: Cocaine.  Allergies  Allergen Reactions   Shrimp [Shellfish Allergy] Shortness Of Breath   Bactroban [Mupirocin] Other (See Comments)    "Sores in nose"   Vicodin [Hydrocodone-Acetaminophen] Itching and Nausea And Vomiting    This is patient's home medication   Eliquis [Apixaban] Itching    Spoke with patient, no rash.  Has tolerated on re-challenge.   Lisinopril Cough   Tylenol [Acetaminophen] Itching    Family History  Problem Relation Age of Onset   Colon cancer Mother 30   Hypertension Mother    Diabetes Sister 17   Hypertension Sister    Diabetes Brother    Hypertension Brother    Diabetes Brother    Hypertension Brother    Kidney disease Son        s/p renal transplant   Hypertension Son    Diabetes Son    Multiple sclerosis Son    Bone cancer Sister 84   Breast cancer Neg Hx  Cervical cancer Neg Hx     Prior to Admission medications   Medication Sig Start Date End Date Taking? Authorizing Provider  albuterol (VENTOLIN HFA) 108 (90 Base) MCG/ACT inhaler Inhale 2 puffs into the lungs every 6 (six) hours as needed for wheezing or shortness of breath. Patient not taking: Reported on 10/29/2021 07/30/20   Maximiano Coss, NP  amLODipine (NORVASC) 10 MG tablet Take 1 tablet (10 mg total) by mouth daily. 02/14/21   Maximiano Coss, NP  apixaban (ELIQUIS) 2.5 MG TABS tablet Take 1 tablet (2.5 mg total) by mouth 2 (two) times daily. 08/24/21   Shary Key, DO  carvedilol (COREG) 3.125 MG tablet Take 1 tablet (3.125 mg total) by mouth 2 (two) times daily with a meal. 08/24/21   Paige, Weldon Picking, DO  cetirizine (ZYRTEC) 10 MG tablet Take 10 mg by mouth daily as needed for allergies.    [provider]   cloNIDine (CATAPRES - DOSED IN MG/24 HR) 0.1 mg/24hr patch Place 1 patch (0.1 mg total) onto the skin every Saturday. Patient not taking: Reported on 10/29/2021 06/14/21   Caren Griffins, MD  hydrALAZINE (APRESOLINE) 50 MG tablet Take 1 tablet (50 mg total) by mouth 3 (three) times daily. 06/21/21 10/29/21  Nolberto Hanlon, MD  ibuprofen (ADVIL) 200 MG tablet Take 600 mg by mouth every 6 (six) hours as needed for headache or moderate pain.    [provider]  isosorbide mononitrate (IMDUR) 30 MG 24 hr tablet Take 1 tablet (30 mg total) by mouth daily. 08/17/21   Shary Key, DO  loperamide (IMODIUM) 2 MG capsule Take 2 mg by mouth as needed for diarrhea or loose stools. 07/15/20   [provider]  losartan (COZAAR) 100 MG tablet Take 1 tablet (100 mg total) by mouth daily. 02/14/21   Maximiano Coss, NP  mirtazapine (REMERON) 7.5 MG tablet Take 1 tablet (7.5 mg total) by mouth at bedtime. Patient taking differently: Take 7.5 mg by mouth at bedtime as needed (sleep). 07/15/20   Maximiano Coss, NP  [facility] 4 MG/0.1ML LIQD nasal spray kit Place 1 spray into the nose as needed for opioid reversal. If no response within 3 minutes then repeat once in the other nostril.  Call 911. 05/15/20   [provider]  pregabalin (LYRICA) 50 MG capsule Take 100 mg by mouth at bedtime. 05/07/21   [provider]  rosuvastatin (CRESTOR) 10 MG tablet Take 1 tablet (10 mg total) by mouth daily. Patient not taking: Reported on 10/29/2021 02/14/21 09/15/21  Maximiano Coss, NP  sevelamer carbonate (RENVELA) 2.4 g PACK Take 2.4 g by mouth 3 (three) times daily with meals. Patient not taking: Reported on 10/29/2021 05/15/21   [provider]  sucroferric oxyhydroxide (VELPHORO) 500 MG chewable tablet Chew 1,000 mg by mouth 3 (three) times daily with meals.    [provider]  torsemide (DEMADEX) 20 MG tablet Take 40 mg by mouth daily.    [provider]  VITAMIN D PO  Take 5,000 Units by mouth daily.    [provider]    Physical Exam: Vitals:   12/07/21 1933 12/07/21 2111 12/07/21 2217 12/07/21 2245  BP: (!) 189/135 (!) 217/151 (!) 200/134 (!) 205/131  Pulse: 89 93 70 99  Resp: _0 (!) 26  Temp: 97.7 F (36.5 C)  98.4 F (36.9 C)   TempSrc: Oral  Oral   SpO2: 92% 98% 100% 98%   Constitutional: Chronically ill appearing Eyes: PERRL,  lids and conjunctivae normal ENMT: Mucous membranes are moist. Posterior pharynx clear of any exudate or lesions.Normal dentition.  Neck: normal, supple, no masses, no thyromegaly Respiratory: Crackles B bases Cardiovascular: Regular rate and rhythm, no murmurs / rubs / gallops. 1+ BLE edema. 2+ pedal pulses. No carotid bruits.  Abdomen: no tenderness, no masses palpated. No hepatosplenomegaly. Bowel sounds positive.  Musculoskeletal: no clubbing / cyanosis. No joint deformity upper and lower extremities. Good ROM, no contractures. Normal muscle tone.  Skin: no rashes, lesions, ulcers. No induration Neurologic: CN 2-12 grossly intact. Sensation intact, DTR normal. Strength 5/5 in all 4.  Psychiatric: Normal judgment and insight. Alert and oriented x 3. Normal mood.   Data Reviewed:    Trops 244 and 289   CTA chest/abd/pelvis: 1) no acute dissection 2) pulm edema 3) small R and trace L pleural effusions 4) anasarca 5) cardiomegaly 6) no definite evidence of aortic aneurysm (Not surprising given that this was surgically repaired along with acute dissection in 2020).  I am a little surprised that radiology doesnt comment on any evidence of prior surgical repair of aorta, other than they do note the presence of sternotomy wires.  CBC    Component Value Date/Time   WBC 5.3 12/07/2021 1948   RBC 3.52 (L) 12/07/2021 1948   HGB 11.6 (L) 12/07/2021 1957   HGB 8.1 (L) 05/13/2020 1108   HGB 13.1 09/28/2012 0831   HCT 34.0 (L) 12/07/2021 1957   HCT 25.2 (L) 05/13/2020 1108   HCT 40.1 09/28/2012  0831   PLT 229 12/07/2021 1948   PLT 339 01/05/2020 1522   MCV 91.5 12/07/2021 1948   MCV 88 05/13/2020 1108   MCV 90.7 09/28/2012 0831   MCH 28.7 12/07/2021 1948   MCHC 31.4 12/07/2021 1948   RDW 20.6 (H) 12/07/2021 1948   RDW 15.8 (H) 05/13/2020 1108   RDW 14.2 09/28/2012 0831   LYMPHSABS 0.9 12/07/2021 1948   LYMPHSABS 1.0 05/13/2020 1108   LYMPHSABS 1.8 09/28/2012 0831   MONOABS 0.5 12/07/2021 1948   MONOABS 0.5 09/28/2012 0831   EOSABS 0.4 12/07/2021 1948   EOSABS 0.1 05/13/2020 1108   BASOSABS 0.1 12/07/2021 1948   BASOSABS 0.1 05/13/2020 1108   BASOSABS 0.0 09/28/2012 0831   CMP     Component Value Date/Time   NA 141 12/07/2021 1957   NA 140 05/13/2020 1108   NA 142 09/28/2012 0831   K 4.2 12/07/2021 1957   K 4.2 09/28/2012 0831   CL 104 12/07/2021 1957   CL 103 09/28/2012 0831   CO2 23 12/07/2021 1948   CO2 30 (H) 09/28/2012 0831   GLUCOSE 143 (H) 12/07/2021 1957   GLUCOSE 103 (H) 09/28/2012 0831   BUN 53 (H) 12/07/2021 1957   BUN 47 (H) 05/13/2020 1108   BUN 18.3 09/28/2012 0831   CREATININE 11.90 (H) 12/07/2021 1957   CREATININE 2.09 (H) 01/18/2017 1145   CREATININE 1.2 (H) 09/28/2012 0831   CALCIUM 10.0 12/07/2021 1948   CALCIUM 9.4 09/28/2012 0831   PROT 7.0 12/07/2021 1948   PROT 6.9 05/13/2020 1108   PROT 7.2 09/28/2012 0831   ALBUMIN 3.7 12/07/2021 1948   ALBUMIN 4.0 05/13/2020 1108   ALBUMIN 3.5 09/28/2012 0831   AST 15 12/07/2021 1948   AST 35 (H) 09/28/2012 0831   ALT 13 12/07/2021 1948   ALT 23 12/09/2016 1458   ALT 41 09/28/2012 0831   ALKPHOS 89 12/07/2021 1948   ALKPHOS 89 09/28/2012 0831   BILITOT 0.9  12/07/2021 1948   BILITOT 0.2 05/13/2020 1108   BILITOT 0.34 09/28/2012 0831   GFRNONAA 3 (L) 12/07/2021 1948   GFRNONAA 23 (L) 12/09/2016 1458   GFRAA 8 (L) 05/13/2020 1108   GFRAA 26 (L) 12/09/2016 1458      Assessment and Plan: * Hypertensive emergency Pt with HTN emergency in setting of missed dialysis. End organ damage  evidence = elevated troponin. Initial BP in ED 217/151 Most recent BP is 205/131 Goal BP = 180/110 (20% drop) So far has received Amlodipine, Losartan, and 43m IV hydralazine. Resume home BP meds Reordering coreg and imdur as well as the losartan and amlodipine (got doses of the latter 2 in ED). Med rec pending to see if shes still on the PO hydralazine and/or catapres (looks like off catapres as of last months admit). Giving another 181mIV hydralazine now and putting in for PRN. If unable to get adequate control, may need vasoactive drip.  Acute on chronic HFrEF (heart failure with reduced ejection fraction) (HCC) Pulm edema and CP today. Due to volume overload, HTN emergency in setting of missed dialysis. Trying to control BP. Dialysis ASAP.  Fluid overload Due to missed dialysis. Plan for dialysis ASAP though no availability tonight. Trying to control BP in mean time.  [facility]end stage renal [facility] Nephrology called by EDP, no dialysis available for tonight unfortunately. Dialysis ASAP with hypertensive emergency.  Troponin level elevated HTN emergency due to missed dialysis. H/o same last month. No dissection on CTA Control BP.  Metabolic acidosis with increased anion gap and accumulation of organic acids Mild with AG 16, Due to missed dialysis and uremia. Plan: dialysis ASAP  Chronic anticoagulation Cont eliquis  AF (paroxysmal atrial fibrillation) (HCC) Continue coreg      Advance Care Planning:   Code Status: Full Code  Consults: Nephrology Dr. SaJoelyn OmsFamily Communication: No family in room  Severity of Illness: The appropriate patient status for this patient is OBSERVATION. Observation status is judged to be reasonable and necessary in order to provide the required intensity of service to ensure the patient's safety. The patient's presenting symptoms, physical exam findings, and initial radiographic and  laboratory data in the context of their medical condition is felt to place them at decreased risk for further clinical deterioration. Furthermore, it is anticipated that the patient will be medically stable for discharge from the hospital within 2 midnights of admission.   Author: GAEtta Quill DO 12/07/2021 11:14 PM  For on call review [email or web address]

## 2021-12-07 NOTE — ED Notes (Signed)
IV team consult ordered for patient . Multiple unsuccessful venipuncture by ED RN .

## 2021-12-07 NOTE — ED Provider Triage Note (Signed)
Emergency Medicine Provider Triage Evaluation Note  Monique Henderson , a 71 y.o. female  was evaluated in triage.  Pt complains of shortness of breath and abdominal pain with nausea and vomiting.  Patient missed dialysis yesterday.  Last dialysis was this past Thursday.  She had onset of epigastric abdominal pain which she states is severe she has had nausea and vomiting.  She feels like pain radiates into her chest.  New hypoxia noted.  Review of Systems  Positive: Severe abdominal pain nausea vomiting and chest pain with shortness of breath Negative: Diarrhea  Physical Exam  BP (!) 189/135   Pulse 89   Temp 97.7 F (36.5 C) (Oral)   Resp 20   SpO2 92%  Gen:   Awake, no distress   Resp:  Increased respiratory effort MSK:   Moves extremities without difficulty  Other:  Exquisite abdominal tenderness, JVD noted,  Medical Decision Making  Medically screening exam initiated at 7:40 PM.  Appropriate orders placed.  TAEGEN LENNOX was informed that the remainder of the evaluation will be completed by another provider, this initial triage assessment does not replace that evaluation, and the importance of remaining in the ED until their evaluation is complete.  Patient appears extremely ill.  Work-up initiated.  Concern for potential dissection given her markedly elevated blood pressures.  This may also be secondary to volume overload and another cause of epigastric abdominal pain nausea and vomiting.  Patient has been on dialysis for more than a year.   Margarita Mail, PA-C 12/07/21 1946

## 2021-12-07 NOTE — Assessment & Plan Note (Addendum)
Continue coreg.

## 2021-12-07 NOTE — Assessment & Plan Note (Addendum)
Pt with HTN emergency in setting of missed dialysis. End organ damage evidence = elevated troponin. Initial BP in ED 217/151 Most recent BP is 205/131 Goal BP = 180/110 (20% drop) 1. So far has received Amlodipine, Losartan, and '10mg'$  IV hydralazine. 2. Resume home BP meds 1. Reordering coreg and imdur as well as the losartan and amlodipine (got doses of the latter 2 in ED). 2. Med rec pending to see if shes still on the PO hydralazine and/or catapres (looks like off catapres as of last months admit). 3. Giving another '10mg'$  IV hydralazine now and putting in for PRN. 4. If unable to get adequate control, may need vasoactive drip.

## 2021-12-07 NOTE — Assessment & Plan Note (Addendum)
Pulm edema and CP today. Due to volume overload, HTN emergency in setting of missed dialysis. 1. Trying to control BP. 2. Dialysis ASAP.

## 2021-12-07 NOTE — Assessment & Plan Note (Addendum)
Missed dialysis yesterday. Nephrology called by EDP, no dialysis available for tonight unfortunately. 1. Dialysis ASAP with hypertensive emergency.

## 2021-12-07 NOTE — Assessment & Plan Note (Addendum)
Due to missed dialysis. Plan for dialysis ASAP though no availability tonight. 1. Trying to control BP in mean time.

## 2021-12-07 NOTE — ED Triage Notes (Signed)
Patient arrived with EMS from home missed her hemodialysis treatment yesterday reports SOB with pain across her abdomen this morning and intermittent emesis . Hypertensive BP= 190/150 by EMS .

## 2021-12-07 NOTE — ED Provider Notes (Signed)
[facility] EMERGENCY DEPARTMENT Provider Note   CSN: [id number] Arrival date & time: 12/07/21  1928     History  Chief Complaint  Patient presents with   Missed Hemodialysis / ABdominal Pain/SOB     Monique Henderson is a 71 y.o. female hx of ESRD on HD, HTN, here presenting with shortness of breath and chest pain.  Patient missed dialysis yesterday.  Patient then had sudden onset of chest pain and abdominal pain today.  Patient was noted to be hypertensive in the ED.  Patient had a dissection study ordered in triage.  Patient also also noted to have elevated troponin.  Patient was here about 2 weeks ago for similar symptoms.   The history is provided by the patient.       Home Medications Prior to Admission medications   Medication Sig Start Date End Date Taking? Authorizing Provider  albuterol (VENTOLIN HFA) 108 (90 Base) MCG/ACT inhaler Inhale 2 puffs into the lungs every 6 (six) hours as needed for wheezing or shortness of breath. Patient not taking: Reported on 10/29/2021 07/30/20   Maximiano Coss, NP  amLODipine (NORVASC) 10 MG tablet Take 1 tablet (10 mg total) by mouth daily. 02/14/21   Maximiano Coss, NP  apixaban (ELIQUIS) 2.5 MG TABS tablet Take 1 tablet (2.5 mg total) by mouth 2 (two) times daily. 08/24/21   Shary Key, DO  carvedilol (COREG) 3.125 MG tablet Take 1 tablet (3.125 mg total) by mouth 2 (two) times daily with a meal. 08/24/21   Paige, Weldon Picking, DO  cetirizine (ZYRTEC) 10 MG tablet Take 10 mg by mouth daily as needed for allergies.    [provider]  cloNIDine (CATAPRES - DOSED IN MG/24 HR) 0.1 mg/24hr patch Place 1 patch (0.1 mg total) onto the skin every Saturday. Patient not taking: Reported on 10/29/2021 06/14/21   Caren Griffins, MD  hydrALAZINE (APRESOLINE) 50 MG tablet Take 1 tablet (50 mg total) by mouth 3 (three) times daily. 06/21/21 10/29/21  Nolberto Hanlon, MD  ibuprofen (ADVIL) 200 MG tablet Take 600 mg by mouth  every 6 (six) hours as needed for headache or moderate pain.    [provider]  isosorbide mononitrate (IMDUR) 30 MG 24 hr tablet Take 1 tablet (30 mg total) by mouth daily. 08/17/21   Shary Key, DO  loperamide (IMODIUM) 2 MG capsule Take 2 mg by mouth as needed for diarrhea or loose stools. 07/15/20   [provider]  losartan (COZAAR) 100 MG tablet Take 1 tablet (100 mg total) by mouth daily. 02/14/21   Maximiano Coss, NP  mirtazapine (REMERON) 7.5 MG tablet Take 1 tablet (7.5 mg total) by mouth at bedtime. Patient taking differently: Take 7.5 mg by mouth at bedtime as needed (sleep). 07/15/20   Maximiano Coss, NP  [facility] 4 MG/0.1ML LIQD nasal spray kit Place 1 spray into the nose as needed for opioid reversal. If no response within 3 minutes then repeat once in the other nostril.  Call 911. 05/15/20   [provider]  pregabalin (LYRICA) 50 MG capsule Take 100 mg by mouth at bedtime. 05/07/21   [provider]  rosuvastatin (CRESTOR) 10 MG tablet Take 1 tablet (10 mg total) by mouth daily. Patient not taking: Reported on 10/29/2021 02/14/21 09/15/21  Maximiano Coss, NP  sevelamer carbonate (RENVELA) 2.4 g PACK Take 2.4 g by mouth 3 (three) times daily with meals. Patient not taking: Reported on 10/29/2021 05/15/21   [provider]  sucroferric oxyhydroxide (VELPHORO) 500 MG chewable tablet Chew 1,000 mg by mouth 3 (three) times daily with meals.    [provider]  torsemide (DEMADEX) 20 MG tablet Take 40 mg by mouth daily.    [provider]  VITAMIN D PO Take 5,000 Units by mouth daily.    [provider]      Allergies    Shrimp [shellfish allergy], Bactroban [mupirocin], Vicodin [hydrocodone-acetaminophen], Eliquis [apixaban], Lisinopril, and Tylenol [acetaminophen]    Review of Systems   Review of Systems  Cardiovascular:  Positive for chest pain.  Gastrointestinal:  Positive for abdominal pain.  All other  systems reviewed and are negative.   Physical Exam Updated Vital Signs BP (!) 200/134   Pulse 70   Temp 98.4 F (36.9 C) (Oral)   Resp 16   SpO2 100%  Physical Exam Vitals and nursing note reviewed.  Constitutional:      Comments: Uncomfortable and chronically ill  HENT:     Head: Normocephalic.     Nose: Nose normal.     Mouth/Throat:     Mouth: Mucous membranes are moist.  Eyes:     Extraocular Movements: Extraocular movements intact.     Pupils: Pupils are equal, round, and reactive to light.  Cardiovascular:     Rate and Rhythm: Regular rhythm. Tachycardia present.     Pulses: Normal pulses.     Heart sounds: Normal heart sounds.  Pulmonary:     Effort: Pulmonary effort is normal.     Comments: Crackles bilateral bases Abdominal:     General: Abdomen is flat.     Palpations: Abdomen is soft.  Musculoskeletal:     Cervical back: Normal range of motion and neck supple.     Comments: 1+ edema bilaterally  Skin:    General: Skin is warm.     Capillary Refill: Capillary refill takes less than 2 seconds.  Neurological:     General: No focal deficit present.     Mental Status: She is oriented to person, place, and time.  Psychiatric:        Mood and Affect: Mood normal.        Behavior: Behavior normal.     ED Results / Procedures / Treatments   Labs (all labs ordered are listed, but only abnormal results are displayed) Labs Reviewed  CBC WITH DIFFERENTIAL/PLATELET - Abnormal; Notable for the following components:      Result Value   RBC 3.52 (*)    Hemoglobin 10.1 (*)    HCT 32.2 (*)    RDW 20.6 (*)    All other components within normal limits  COMPREHENSIVE METABOLIC PANEL - Abnormal; Notable for the following components:   Glucose, Bld 149 (*)    BUN 59 (*)    Creatinine, Ser 10.98 (*)    GFR, Estimated 3 (*)    Anion gap 16 (*)    All other components within normal limits  LIPASE, BLOOD - Abnormal; Notable for the following components:   Lipase 84  (*)    All other components within normal limits  I-STAT CHEM 8, ED - Abnormal; Notable for the following components:   BUN 53 (*)    Creatinine, Ser 11.90 (*)    Glucose, Bld 143 (*)    Calcium, Ion 1.13 (*)    Hemoglobin 11.6 (*)    HCT 34.0 (*)    All other components within normal limits  TROPONIN I (HIGH SENSITIVITY) - Abnormal; Notable for the following  components:   Troponin I (High Sensitivity) 289 (*)    All other components within normal limits  URINALYSIS, ROUTINE W REFLEX MICROSCOPIC  TROPONIN I (HIGH SENSITIVITY)    EKG EKG Interpretation  Date/Time:  Sunday December 07 2021 19:55:16 EDT Ventricular Rate:  93 PR Interval:  156 QRS Duration: 136 QT Interval:  394 QTC Calculation: 489 R Axis:   59 Text Interpretation: Normal sinus rhythm Possible Left atrial enlargement Non-specific intra-ventricular conduction block Nonspecific T wave abnormality Abnormal ECG When compared with ECG of 18-Nov-2021 06:49, No significant change since last tracing Confirmed by Wandra Arthurs [phone]) on 12/07/2021 9:29:35 PM  Radiology CT Angio Chest/Abd/Pel for Dissection W and/or Wo Contrast  Result Date: 12/07/2021 CLINICAL DATA:  Aortic aneurysm, known or suspected. EXAM: CT ANGIOGRAPHY CHEST, ABDOMEN AND PELVIS TECHNIQUE: Non-contrast CT of the chest was initially obtained. Multidetector CT imaging through the chest, abdomen and pelvis was performed using the standard protocol during bolus administration of intravenous contrast. Multiplanar reconstructed images and MIPs were obtained and reviewed to evaluate the vascular anatomy. RADIATION DOSE REDUCTION: This exam was performed according to the departmental dose-optimization program which includes automated exposure control, adjustment of the mA and/or kV according to patient size and/or use of iterative reconstruction technique. CONTRAST:  65m OMNIPAQUE IOHEXOL 350 MG/ML SOLN COMPARISON:  06/19/2021. FINDINGS: CTA CHEST FINDINGS Cardiovascular:  The heart is enlarged and there is no pericardial effusion. A few scattered coronary artery calcifications are noted. The distal tip of a right internal jugular central venous catheter terminates in the right atrium. There is atherosclerotic calcification of the aorta with mural thrombus. The ascending aorta measures up to 3.7 cm in diameter. Evaluation for dissection is limited due to mixing artifact in the aorta. No definite dissection is seen. The pulmonary trunk is distended suggesting underlying pulmonary artery hypertension. Mediastinum/Nodes: No mediastinal, hilar, or axillary lymphadenopathy by size criteria. The thyroid gland, trachea, and esophagus are within normal limits. Lungs/Pleura: There is a moderate pleural effusion on the right. There is a trace left pleural effusion. Hazy ground-glass opacities are noted in the lungs bilaterally. Strandy atelectasis is noted at the lung bases. No pneumothorax. Musculoskeletal: Sternotomy wires are present over the midline. Degenerative changes are present in the thoracic spine. No acute osseous abnormality. Review of the MIP images confirms the above findings. CTA ABDOMEN AND PELVIS FINDINGS VASCULAR Aorta: Atherosclerotic calcification with mural thrombus. Normal caliber aorta without aneurysm, dissection, vasculitis or significant stenosis. Celiac: Moderate narrowing of the origin of the celiac artery which may be due to respiratory phase. Patent without evidence of aneurysm, dissection, or vasculitis. SMA: Patent without evidence of aneurysm, dissection, vasculitis or significant stenosis. Renals: Bilateral renal arteries are patent without evidence of aneurysm or dissection. Evaluation of the distal renal arteries is limited due to suboptimal timing of contrast bolus IMA: Patent, not well evaluated due to timing of contrast bolus. Inflow: Atherosclerotic calcification. Patent without evidence of aneurysm, dissection, vasculitis or significant stenosis. Veins:  No obvious venous abnormality within the limitations of this arterial phase study. Review of the MIP images confirms the above findings. NON-VASCULAR Hepatobiliary: There is reflux of contrast into the inferior vena cava and hepatic veins. A vague hypodensity is noted in the left lobe of the liver measuring 1.9 cm, axial image 65. The gallbladder is without stones. No biliary ductal dilatation Pancreas: Limited evaluation due to hardware artifact. No pancreatic ductal dilatation or surrounding inflammatory changes. Spleen: Normal in size without focal abnormality. Adrenals/Urinary Tract: The adrenal  glands are within normal limits. Renal atrophy is present bilaterally. Evaluation of the kidneys is limited due to hardware artifact. No hydronephrosis. Excreted contrast is present in the urinary bladder and there is minimal distention. Stomach/Bowel: The stomach is within normal limits. No bowel obstruction, free air, or pneumatosis. The individual bowel loops are not well delineated. The appendix is not definitely seen on exam. Lymphatic: No definite abdominal or pelvic lymphadenopathy. Reproductive: Calcified lesions are present in the uterus likely representing fibroids. No adnexal mass is seen. Other: No ascites.  Anasarca is noted. Musculoskeletal: Degenerative changes are present in the lumbar spine. Fixation hardware at L2 and L3 and L4-L5. No acute osseous abnormality. Review of the MIP images confirms the above findings. IMPRESSION: 1. No definite evidence of aortic aneurysm. There is heterogeneous enhancement of the aortic arch and descending aorta which may be related to turbulent flow and timing of contrast bolus, less likely dissection. If there is clinical concern for aortic dissection, repeat CT chest may be beneficial for further evaluation. 2. Small right pleural effusion with trace left pleural effusion. 3. Hazy ground-glass opacities in the lungs bilaterally, possible edema or infiltrate. 4.  Cardiomegaly with coronary artery calcifications. 5. No acute process in the abdomen and pelvis. 6. Anasarca. 7. Remaining incidental findings as described above. Electronically Signed   By: Brett Fairy M.D.   On: 12/07/2021 22:36   DG Chest 2 View  Result Date: 12/07/2021 CLINICAL DATA:  Shortness of breath.  Missed hemodialysis yesterday. EXAM: CHEST - 2 VIEW COMPARISON:  Radiograph 11/18/2021 FINDINGS: Right-sided dialysis catheter tip in the right atrium. Post median sternotomy. Similar cardiomegaly. Increased pulmonary edema from prior radiograph. Minor scarring in the left mid lung. Small bilateral pleural effusions. Mild fluid in the fissures. No pneumothorax or confluent airspace disease. IMPRESSION: 1. Increased pulmonary edema from prior radiograph. Small bilateral pleural effusions. 2. Similar cardiomegaly. Electronically Signed   By: Keith Rake M.D.   On: 12/07/2021 21:14    Procedures Procedures    Medications Ordered in ED Medications  morphine (PF) 4 MG/ML injection 4 mg (has no administration in time range)  hydrALAZINE (APRESOLINE) injection 10 mg (has no administration in time range)  amLODipine (NORVASC) tablet 10 mg (has no administration in time range)  losartan (COZAAR) tablet 100 mg (has no administration in time range)  iohexol (OMNIPAQUE) 350 MG/ML injection 75 mL (75 mLs Intravenous Contrast Given 12/07/21 2046)  iohexol (OMNIPAQUE) 350 MG/ML injection 50 mL (50 mLs Intravenous Contrast Given 12/07/21 2202)    ED Course/ Medical Decision Making/ A&P Clinical Course as of 12/07/21 2240  Sun Dec 07, 2021  2054 CT Angio Chest/Abd/Pel for Dissection W and/or Wo Contrast [AH]    Clinical Course User Index [AH] Margarita Mail, PA-C                           Medical Decision Making Monique Henderson is a 71 y.o. female history of ESRD on dialysis, here presenting with shortness of breath and chest pain and hypertension.  Patient has a new oxygen requirement and  appears volume overloaded.  Dissection study was ordered in triage.  We will follow-up CBC and CMP and troponin and dissection study  10:48 PM Dissection study showed some turbulence in the aorta.  However my clinical suspicion for dissection is low in her case. Patient has history of longstanding hypertension.  Patient's troponin was 289 initially.  I reviewed her previous chart and patient  always has an elevated troponin.  I think this is in the setting of ESRD and also uncontrolled hypertension.  Patient has no ischemic changes on her EKG.  I discussed case with Dr. Joelyn Oms from nephrology.  He states that he is unable to dialyze patient tonight and given that patient required oxygen, will admit patient for dialysis and also to trend her troponins.  Hold off on heparin for now   Problems Addressed: Elevated troponin: chronic illness or injury ESRD (end stage renal disease) on dialysis [facility]): chronic illness or injury  Amount and/or Complexity of Data Reviewed Labs: ordered. Decision-making details documented in ED Course. Radiology: ordered and independent interpretation performed. Decision-making details documented in ED Course. ECG/medicine tests: ordered and independent interpretation performed. Decision-making details documented in ED Course.  Risk Prescription drug management.    Final Clinical Impression(s) / ED Diagnoses Final diagnoses:  None    Rx / DC Orders ED Discharge Orders     None         Drenda Freeze, MD 12/07/21 2249

## 2021-12-07 NOTE — Assessment & Plan Note (Addendum)
HTN emergency due to missed dialysis. H/o same last month. No dissection on CTA 1. Control BP.

## 2021-12-08 ENCOUNTER — Encounter (HOSPITAL_COMMUNITY): Payer: Self-pay | Admitting: Internal Medicine

## 2021-12-08 DIAGNOSIS — N250 Renal osteodystrophy: Secondary | ICD-10-CM | POA: Diagnosis not present

## 2021-12-08 DIAGNOSIS — I161 Hypertensive emergency: Secondary | ICD-10-CM | POA: Diagnosis not present

## 2021-12-08 DIAGNOSIS — Z8619 Personal history of other infectious and parasitic diseases: Secondary | ICD-10-CM

## 2021-12-08 DIAGNOSIS — E8779 Other fluid overload: Secondary | ICD-10-CM | POA: Diagnosis not present

## 2021-12-08 DIAGNOSIS — D631 Anemia in chronic kidney disease: Secondary | ICD-10-CM | POA: Diagnosis not present

## 2021-12-08 DIAGNOSIS — Z992 Dependence on renal dialysis: Secondary | ICD-10-CM | POA: Diagnosis not present

## 2021-12-08 DIAGNOSIS — N186 End stage renal disease: Secondary | ICD-10-CM | POA: Diagnosis not present

## 2021-12-08 DIAGNOSIS — R0602 Shortness of breath: Secondary | ICD-10-CM | POA: Diagnosis not present

## 2021-12-08 DIAGNOSIS — R079 Chest pain, unspecified: Secondary | ICD-10-CM | POA: Diagnosis not present

## 2021-12-08 LAB — URINALYSIS, ROUTINE W REFLEX MICROSCOPIC
Bilirubin Urine: NEGATIVE
Glucose, UA: 50 mg/dL — AB
Ketones, ur: NEGATIVE mg/dL
Nitrite: NEGATIVE
Protein, ur: 100 mg/dL — AB
Specific Gravity, Urine: 1.017 (ref 1.005–1.030)
WBC, UA: >50 WBC/hpf — ABNORMAL HIGH (ref 0–5)
pH: 8 (ref 5.0–8.0)

## 2021-12-08 LAB — CBC
HCT: 30.2 % — ABNORMAL LOW (ref 36.0–46.0)
Hemoglobin: 9.7 g/dL — ABNORMAL LOW (ref 12.0–15.0)
MCH: 28.7 pg (ref 26.0–34.0)
MCHC: 32.1 g/dL (ref 30.0–36.0)
MCV: 89.3 fL (ref 80.0–100.0)
Platelets: 230 10*3/uL (ref 150–400)
RBC: 3.38 MIL/uL — ABNORMAL LOW (ref 3.87–5.11)
RDW: 20.4 % — ABNORMAL HIGH (ref 11.5–15.5)
WBC: 5.5 10*3/uL (ref 4.0–10.5)
nRBC: 0 % (ref 0.0–0.2)

## 2021-12-08 LAB — BASIC METABOLIC PANEL
Anion gap: 17 — ABNORMAL HIGH (ref 5–15)
BUN: 61 mg/dL — ABNORMAL HIGH (ref 8–23)
CO2: 22 mmol/L (ref 22–32)
Calcium: 9.6 mg/dL (ref 8.9–10.3)
Chloride: 101 mmol/L (ref 98–111)
Creatinine, Ser: 11.4 mg/dL — ABNORMAL HIGH (ref 0.44–1.00)
GFR, Estimated: 3 mL/min — ABNORMAL LOW (ref >60)
Glucose, Bld: 108 mg/dL — ABNORMAL HIGH (ref 70–99)
Potassium: 4 mmol/L (ref 3.5–5.1)
Sodium: 140 mmol/L (ref 135–145)

## 2021-12-08 LAB — TROPONIN I (HIGH SENSITIVITY): Troponin I (High Sensitivity): 280 ng/L (ref <18)

## 2021-12-08 LAB — HEPATITIS B SURFACE ANTIGEN: Hepatitis B Surface Ag: NONREACTIVE

## 2021-12-08 LAB — HEPATITIS B CORE ANTIBODY, TOTAL: Hep B Core Total Ab: NONREACTIVE

## 2021-12-08 LAB — HEPATITIS B SURFACE ANTIBODY,QUALITATIVE: Hep B S Ab: REACTIVE — AB

## 2021-12-08 LAB — HEPATITIS C ANTIBODY: HCV Ab: REACTIVE — AB

## 2021-12-08 MED ORDER — HEPARIN SODIUM (PORCINE) 1000 UNIT/ML DIALYSIS
1000.0000 [IU] | INTRAMUSCULAR | Status: DC | PRN
  Filled 2021-12-08: qty 1

## 2021-12-08 MED ORDER — CARVEDILOL 6.25 MG PO TABS: 6.2500 mg | ORAL_TABLET | Freq: Two times a day (BID) | ORAL | 1 refills | Status: DC

## 2021-12-08 MED ORDER — DIPHENHYDRAMINE HCL 50 MG/ML IJ SOLN
25.0000 mg | Freq: Once | INTRAMUSCULAR | Status: AC
  Administered 2021-12-08: 25 mg via INTRAVENOUS
  Filled 2021-12-08: qty 1

## 2021-12-08 MED ORDER — CHLORHEXIDINE GLUCONATE CLOTH 2 % EX PADS: 6.0000 | MEDICATED_PAD | Freq: Every day | CUTANEOUS | Status: DC

## 2021-12-08 MED ORDER — ALTEPLASE 2 MG IJ SOLR: 2.0000 mg | Freq: Once | INTRAMUSCULAR | Status: DC | PRN

## 2021-12-08 NOTE — ED Notes (Signed)
Breakfast order placed ?

## 2021-12-08 NOTE — Progress Notes (Addendum)
Pt receives out-pt HD at [facility] on TTS. Pt arrives at 11:30 for 11:50 chair time. Will assist as needed.   Melven Sartorius Renal Navigator [phone]  Addendum at 4:12 pm: Pt will d/c to home today. Contacted [facility] to advise clinic of pt's d/c today and that pt should resume care tomorrow.

## 2021-12-08 NOTE — Procedures (Signed)
   I was present at this dialysis session, have reviewed the session itself and made  appropriate changes Kelly Splinter MD [place] pager [phone]   12/08/2021, 11:20 AM

## 2021-12-08 NOTE — Care Management Obs Status (Signed)
[place] NOTIFICATION   Patient Details  Name: Monique Henderson MRN: [id number] Date of Birth: Jul 06, 1950   Medicare Observation Status Notification Given:  Ernesta Amble, RN 12/08/2021, 4:08 PM

## 2021-12-08 NOTE — Consult Note (Signed)
Renal Service Consult Note [facility]  Monique Henderson 12/08/2021 Sol Blazing, MD Requesting Physician: Dr. Si Raider  Reason for Consult: ESRD pt w/ SOB HPI: The patient is a 71 y.o. year-old w/ hx of ESRD on HD, malignant HTN, prior aortic dissection, atrial fib on eliquis who presented with c/o SOB and chest pain and abd pain. She missed her HD on Sat. In ED trop was up, BP"s high at 215/ 152. Pt rec'd BP lowering meds in ED.  CXR showed IS edema. We are asked to see for dialysis.   Pt is on HD now. Main c/o is itching, denies current CP/ SOB or abd pain.   Pt lives w/ a friend, occ tobacco, no etoh, never married. Drives herself to dialysis (2015 [employer]). Missed HD sat, does not give a reason.    ROS - denies CP, no joint pain, no HA, no blurry vision, no rash, no diarrhea, no nausea/ vomiting   Past Medical History  Past Medical History:  Diagnosis Date   AF (paroxysmal atrial fibrillation) ([place]) 05/29/2019   on Coumadin   Aortic atherosclerosis ([place]) 07/05/2019   Aortic dissection ([place]) 04/04/2019   s/p repair   Bone spur 2008   Right calcaneal foot spur   Breast cancer ([place]) 2004   Ductal carcinoma in situ of the left breast; S/P left partial mastectomy 02/26/2003; S/P re-excision of cranial and lateral margins11/18/2004.radiation   Cerebral thrombosis with cerebral infarction 05/22/2019   Chronic low back pain 06/22/2016   Chronic obstructive lung disease ([place]) 01/16/2017   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 Autumn Messing; S/P re-excision of superior and inferior margins 10/27/2012.    ESRD on hemodialysis ([place]) 05/29/2019   Essential hypertension 09/16/2006   GERD 09/16/2006   Hepatitis C    treated and RNA confirmed not detectable 01/2017   Insomnia 03/14/2015   Malnutrition of moderate degree 05/19/2019   Non compliance with medical treatment 12/04/2017   Normocytic anemia     With thrombocytosis   Osteoarthritis    Right ureteral stone 2002   S/P lumbar spinal fusion 01/18/2014   S/P lumbar decompressive laminectomy, fusion, and plating for lumbar spinal stenosis on 05/27/2009 by Dr. Eustace Moore.  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 Eustace Moore.     Tobacco use disorder 04/19/2009   Uterine fibroid    Wears dentures    top   Past Surgical History  Past Surgical History:  Procedure Laterality Date   ANTERIOR LAT LUMBAR FUSION N/A 01/18/2014   Procedure: ANTERIOR LATERAL LUMBAR FUSION LUMBAR TWO-THREE;  Surgeon: Eustace Moore, MD;  Location: [place] [facility];  Service: Neurosurgery;  Laterality: N/A;   ANTERIOR LUMBAR FUSION  01/18/2014   AV FISTULA PLACEMENT Left 04/20/2019   Procedure: ARTERIOVENOUS (AV) FISTULA CREATION;  Surgeon: [name], MD;  Location: [place];  Service: Vascular;  Laterality: Left;   BACK SURGERY     BREAST LUMPECTOMY Left 01/2003   BREAST LUMPECTOMY Right 2014   BREAST LUMPECTOMY WITH NEEDLE LOCALIZATION AND AXILLARY SENTINEL LYMPH NODE BX Right 10/13/2012   Procedure: BREAST LUMPECTOMY WITH NEEDLE LOCALIZATION;  Surgeon: [name], MD;  Location: [place];  Service: General;  Laterality: Right;  Right breast wire  localized lumpectomy   INSERTION OF DIALYSIS CATHETER Right 04/20/2019   Procedure: INSERTION OF DIALYSIS CATHETER, right internal jugular;  Surgeon: Cain, Brandon Christopher, MD;  Location: [facility];  Service: Vascular;  Laterality: Right;   INSERTION OF DIALYSIS CATHETER Right 09/24/2020   Procedure: INSERTION OF TUNNELED DIALYSIS CATHETER;  Surgeon: Early, Todd F, MD;  Location: [facility];  Service: Vascular;  Laterality: Right;   IR FLUORO GUIDE CV LINE RIGHT  09/22/2020   IR THORACENTESIS ASP  PLEURAL SPACE W/IMG GUIDE  05/19/2019   IR US GUIDE VASC ACCESS LEFT  09/22/2020   IR US GUIDE VASC ACCESS RIGHT  09/22/2020   IR VENOCAVAGRAM SVC  09/22/2020   LAMINECTOMY  05/27/2009   Lumbar decompressive laminectomy, fusion and plating for lumbar spinal stensosis   LIGATION OF ARTERIOVENOUS  FISTULA Left 09/24/2020   Procedure: LIGATION OF LEFT ARM ARTERIOVENOUS  FISTULA;  Surgeon: Early, Todd F, MD;  [facility];  Service: Vascular;  Laterality: Left;   LUMBAR LAMINECTOMY/DECOMPRESSION MICRODISCECTOMY Left 03/23/2013   Procedure: LUMBAR LAMINECTOMY/DECOMPRESSION MICRODISCECTOMY 1 LEVEL;  Surgeon: David S Jones, MD;  [facility];  Service: Neurosurgery;  Laterality: Left;  LUMBAR LAMINECTOMY/DECOMPRESSION MICRODISCECTOMY 1 LEVEL   MASTECTOMY, PARTIAL Left 02/26/2003   ; S/P re-excision of cranial and lateral margins 04/19/2003.    RE-EXCISION OF BREAST CANCER,SUPERIOR MARGINS Right 10/27/2012   Procedure: RE-EXCISION OF BREAST CANCER,SUPERIOR and inferior MARGINS;  Surgeon: Paul S Toth III, MD;  [facility];  Service: General;  Laterality: Right;   RE-EXCISION OF BREAST LUMPECTOMY Left 04/2003   TEE WITHOUT CARDIOVERSION N/A 04/04/2019   Procedure: Transesophageal Echocardiogram ([place]);  Surgeon: Atkins, Broadus Z, MD;  [facility];  Service: Open Heart Surgery;  Laterality: N/A;   THORACIC AORTIC ANEURYSM REPAIR N/A 04/04/2019   Procedure: THORACIC ASCENDING ANEURYSM REPAIR (AAA)  USING 28 MM X 30 CM HEMASHIELD PLATINUM VASCULAR GRAFT;  Surgeon: Atkins, Broadus Z, MD;  [facility];  Service: Open Heart Surgery;  Laterality: N/A;   Family History  Family History  Problem Relation Age of Onset   Colon cancer Mother 77   Hypertension Mother    Diabetes Sister 21   Hypertension Sister    Diabetes Brother    Hypertension Brother    Diabetes Brother    Hypertension Brother    Kidney disease Son        s/p renal transplant   Hypertension Son    Diabetes Son     Multiple sclerosis Son    Bone cancer Sister 51   Breast cancer Neg Hx    Cervical cancer Neg Hx    Social History  reports that she quit smoking about 2 years ago. Her smoking use included cigarettes. She has a 11.00 pack-year smoking history. She has never used smokeless tobacco. She reports that she does not currently use alcohol after a past usage of about 2.0 standard drinks of alcohol per week. She reports that she does not currently use drugs after having used the following drugs: Cocaine. Allergies  Allergies  Allergen Reactions   Shrimp [Shellfish Allergy] Shortness Of Breath   Bactroban [Mupirocin] Other (See Comments)    "Sores in nose"   Vicodin [Hydrocodone-Acetaminophen] Itching and Nausea And Vomiting    This is patient's home medication   Eliquis [Apixaban] Itching    Spoke with patient, no rash.  Has tolerated on re-challenge.   Lisinopril Cough   Tylenol [Acetaminophen] Itching   Home medications Prior to Admission medications     Medication Sig Start Date End Date Taking? Authorizing Provider  albuterol (VENTOLIN HFA) 108 (90 Base) MCG/ACT inhaler Inhale 2 puffs into the lungs every 6 (six) hours as needed for wheezing or shortness of breath. Patient not taking: Reported on 10/29/2021 07/30/20   Morrow, Richard, NP  amLODipine (NORVASC) 10 MG tablet Take 1 tablet (10 mg total) by mouth daily. 02/14/21   Morrow, Richard, NP  apixaban (ELIQUIS) 2.5 MG TABS tablet Take 1 tablet (2.5 mg total) by mouth 2 (two) times daily. 08/24/21   Paige, Victoria J, DO  carvedilol (COREG) 3.125 MG tablet Take 1 tablet (3.125 mg total) by mouth 2 (two) times daily with a meal. 08/24/21   Paige, Victoria J, DO  cetirizine (ZYRTEC) 10 MG tablet Take 10 mg by mouth daily as needed for allergies.    [provider]  cloNIDine (CATAPRES - DOSED IN MG/24 HR) 0.1 mg/24hr patch Place 1 patch (0.1 mg total) onto the skin every Saturday. Patient not taking: Reported on 10/29/2021 06/14/21    Gherghe, Costin M, MD  hydrALAZINE (APRESOLINE) 25 MG tablet Take 25 mg by mouth 2 (two) times daily. 11/04/21   [provider]  hydrALAZINE (APRESOLINE) 50 MG tablet Take 1 tablet (50 mg total) by mouth 3 (three) times daily. 06/21/21 10/29/21  Amery, Sahar, MD  ibuprofen (ADVIL) 200 MG tablet Take 600 mg by mouth every 6 (six) hours as needed for headache or moderate pain.    [provider]  isosorbide mononitrate (IMDUR) 30 MG 24 hr tablet Take 1 tablet (30 mg total) by mouth daily. 08/17/21   Paige, Victoria J, DO  loperamide (IMODIUM) 2 MG capsule Take 2 mg by mouth as needed for diarrhea or loose stools. 07/15/20   [provider]  losartan (COZAAR) 100 MG tablet Take 1 tablet (100 mg total) by mouth daily. 02/14/21   Morrow, Richard, NP  mirtazapine (REMERON) 7.5 MG tablet Take 1 tablet (7.5 mg total) by mouth at bedtime. Patient taking differently: Take 7.5 mg by mouth at bedtime as needed (sleep). 07/15/20   Morrow, Richard, NP  NARCAN 4 MG/0.1ML LIQD nasal spray kit Place 1 spray into the nose as needed for opioid reversal. If no response within 3 minutes then repeat once in the other nostril.  Call 911. 05/15/20   [provider]  pregabalin (LYRICA) 50 MG capsule Take 100 mg by mouth at bedtime. 05/07/21   [provider]  rosuvastatin (CRESTOR) 10 MG tablet Take 1 tablet (10 mg total) by mouth daily. Patient not taking: Reported on 10/29/2021 02/14/21 09/15/21  Morrow, Richard, NP  sevelamer carbonate (RENVELA) 2.4 g PACK Take 2.4 g by mouth 3 (three) times daily with meals. Patient not taking: Reported on 10/29/2021 05/15/21   [provider]  sucroferric oxyhydroxide (VELPHORO) 500 MG chewable tablet Chew 1,000 mg by mouth 3 (three) times daily with meals.    [provider]  torsemide (DEMADEX) 20 MG tablet Take 40 mg by mouth daily.    [provider]  VITAMIN D PO Take 5,000 Units by mouth daily.    [provider]     Vitals:   12/08/21 0930 12/08/21 1000 12/08/21 1030 12/08/21 1100  BP: (!) 170/100 (!) 171/94 (!) 155/87 (!) 166/117  Pulse: 81 80 (!) 107 86  Resp: (!) 22 17 (!) 84 (!) 85  Temp:      TempSrc:      SpO2: 100% 100% 100% 98%  Weight:         Exam Gen alert, no distress, on HD, nasal O2 No rash, cyanosis or gangrene Sclera anicteric, throat clear  No jvd or bruits Chest clear bilat to bases, no rales/ wheezing RRR no MRG Abd soft ntnd no mass or ascites +bs GU defer MS no joint effusions or deformity Ext no LE or UE edema, no wounds or ulcers Neuro is alert, Ox 3 , nf    RIJ TDC intact      Home meds include - amlodipine 10, apixaban 2.5 bid, carvedilol 3.125 bid, isosorbide mononitrate 30, losartan 100 qd     OP HD: TTS AF  4h 45min  400/1.5   2/2 bath  TDC RIJ  Hep none (needs updating, this from 10/2021)    CTA chest / abd / pelvis - +pulm edema, no dissection, CM  Assessment/ Plan: HTN'sive emergency - bp's in ED 215/ 151, has rec'd po and IV bp lowering meds in ED. Is up on HD now for volume removal. Pt missed HD on Sat, vol ^ probably contributing to poor BP control.  SOB / pulm edema/ HFrEF - not in severe distress, lowering vol w/ HD now.  ESRD - on HD TTS. Missed last HD on Sat. HD now, and again tomorrow most likely.  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     

## 2021-12-08 NOTE — Progress Notes (Signed)
Patient has been positioned to be as comfortable as possible.  Anticipate easing as dialysis begins.  Will continue to monitor

## 2021-12-08 NOTE — Care Management CC44 (Signed)
Condition Code 44 Documentation Completed  Patient Details  Name: Monique Henderson MRN: [id number] Date of Birth: 1951-02-11   Condition Code 44 given:  Yes Patient signature on Condition Code 44 notice:  Yes Documentation of 2 MD's agreement:  Yes Code 44 added to claim:  Yes    Laurena Slimmer, RN 12/08/2021, 4:08 PM

## 2021-12-08 NOTE — Discharge Summary (Signed)
Monique SPEEDY IRW:[id number] DOB: [birth year] DOA: 12/07/2021  PCP: Pcp, No  Admit date: 12/07/2021 Discharge date: 12/08/2021  Time spent: 45 minutes  Recommendations for Outpatient Follow-up:  Pcp f/u 1-2 weeks for BP check     Discharge Diagnoses:  Principal Problem:   Hypertensive emergency Active Problems:   ESRD (end stage renal disease) on dialysis ([place])   Fluid overload   Acute on chronic HFrEF (heart failure with reduced ejection fraction) (H[facility])   Troponin level elevated   Metabolic acidosis with increased anion gap and accumulation of organic acids   AF (paroxysmal atrial fibrillation) (HCC)   Chronic anticoagulation   Tobacco use disorder   ESRD on dialysis ([place])   History of hepatitis C   Discharge Condition: stable  Diet recommendation: renal  Filed Weights   12/08/21 0737  Weight: 46.8 kg    History of present illness:  From admission h and p:  Monique Henderson is a 71 y.o. female with medical history significant of ESRD on Dialysis TTS, last dialysis on thurs, missed dialysis yesterday; malignant HTN, prior aortic dissection, AF on eliquis. Pt with h/o admit just last month for HTN urgency / emergency in setting of missed dialysis. Pt with h/o aortic aneurysm and acute dissection in 2020 s/p surgical repair. Pt with sudden onset of SOB, CP, and abd pain today.  Elevated trop. Pt admitted 1 month ago with similar presentation. BP very hypertensive on presentation to ED today 217/151  Hospital Course:  Patient presented with shortness of breath in the setting of missed dialysis and home bp meds. On arrival found to have hypertensive urgency with BPs in the 210s/150s. Home meds were resumed. Hemodialysis performed on hospital day 1 and patient tolerated it well. BP improved to 170s/110s. She did not have an O2 requirement. Her troponin was mildly elevated and flat, ekg without ischemic changes and no chest pain, do not think this ACS. CTA obtained in ED was  negative for aortic dissection. Will d/c home to resume tts hemodialysis tomorrow. She reports having all her home meds. I will increase her coreg. We discussed the importance of medication and hemodialysis compliance.   Procedures: hemodialysis   Consultations: nephrology  Discharge Exam: Vitals:   12/08/21 1201 12/08/21 1500  BP: (!) 181/109 (!) 187/107  Pulse: 85   Resp: (!) 24 (!) 22  Temp:    SpO2: 100%     General: NAD Cardiovascular: RRR, soft systolic murmur Respiratory: CTAB Ext: no edema  Discharge Instructions   Discharge Instructions     Diet - low sodium heart healthy   Complete by: As directed    Increase activity slowly   Complete by: As directed    No wound care   Complete by: As directed       Allergies as of 12/08/2021       Reactions   Shrimp [shellfish Allergy] Shortness Of Breath   Bactroban [mupirocin] Other (See Comments)   "Sores in nose"   Vicodin [hydrocodone-acetaminophen] Itching, Nausea And Vomiting   This is patient's home medication   Eliquis [apixaban] Itching   Spoke with patient, no rash.  Has tolerated on re-challenge.   Lisinopril Cough   Tylenol [acetaminophen] Itching        Medication List     STOP taking these medications    ibuprofen 200 MG tablet Commonly known as: ADVIL       TAKE these medications    albuterol 108 (90 Base) MCG/ACT inhaler Commonly known as:  VENTOLIN HFA Inhale 2 puffs into the lungs every 6 (six) hours as needed for wheezing or shortness of breath.   amLODipine 10 MG tablet Commonly known as: NORVASC Take 1 tablet (10 mg total) by mouth daily.   apixaban 2.5 MG Tabs tablet Commonly known as: ELIQUIS Take 1 tablet (2.5 mg total) by mouth 2 (two) times daily.   carvedilol 6.25 MG tablet Commonly known as: COREG Take 1 tablet (6.25 mg total) by mouth 2 (two) times daily with a meal. What changed:  medication strength how much to take   cetirizine 10 MG tablet Commonly known  as: ZYRTEC Take 10 mg by mouth daily as needed for allergies.   cloNIDine 0.1 mg/24hr patch Commonly known as: CATAPRES - Dosed in mg/24 hr Place 1 patch (0.1 mg total) onto the skin every Saturday.   hydrALAZINE 50 MG tablet Commonly known as: APRESOLINE Take 1 tablet (50 mg total) by mouth 3 (three) times daily.   hydrALAZINE 25 MG tablet Commonly known as: APRESOLINE Take 25 mg by mouth 2 (two) times daily.   isosorbide mononitrate 30 MG 24 hr tablet Commonly known as: IMDUR Take 1 tablet (30 mg total) by mouth daily.   loperamide 2 MG capsule Commonly known as: IMODIUM Take 2 mg by mouth as needed for diarrhea or loose stools.   losartan 100 MG tablet Commonly known as: COZAAR Take 1 tablet (100 mg total) by mouth daily.   mirtazapine 7.5 MG tablet Commonly known as: REMERON Take 1 tablet (7.5 mg total) by mouth at bedtime. What changed:  when to take this reasons to take this   Narcan 4 MG/0.1ML Liqd nasal spray kit Generic drug: naloxone Place 1 spray into the nose as needed for opioid reversal. If no response within 3 minutes then repeat once in the other nostril.  Call 911.   pregabalin 50 MG capsule Commonly known as: LYRICA Take 100 mg by mouth at bedtime.   rosuvastatin 10 MG tablet Commonly known as: CRESTOR Take 1 tablet (10 mg total) by mouth daily.   sevelamer carbonate 2.4 g Pack Commonly known as: RENVELA Take 2.4 g by mouth 3 (three) times daily with meals.   torsemide 20 MG tablet Commonly known as: DEMADEX Take 40 mg by mouth daily.   Velphoro 500 MG chewable tablet Generic drug: sucroferric oxyhydroxide Chew 1,000 mg by mouth 3 (three) times daily with meals.   VITAMIN D PO Take 5,000 Units by mouth daily.       Allergies  Allergen Reactions   Shrimp [Shellfish Allergy] Shortness Of Breath   Bactroban [Mupirocin] Other (See Comments)    "Sores in nose"   Vicodin [Hydrocodone-Acetaminophen] Itching and Nausea And Vomiting     This is patient's home medication   Eliquis [Apixaban] Itching    Spoke with patient, no rash.  Has tolerated on re-challenge.   Lisinopril Cough   Tylenol [Acetaminophen] Itching    Follow-up Information     Your PCP Follow up.   Why: in 1-2 weeks                 The results of significant diagnostics from this hospitalization (including imaging, microbiology, ancillary and laboratory) are listed below for reference.    Significant Diagnostic Studies: CT Angio Chest/Abd/Pel for Dissection W and/or Wo Contrast  Result Date: 12/07/2021 CLINICAL DATA:  Aortic aneurysm, known or suspected. EXAM: CT ANGIOGRAPHY CHEST, ABDOMEN AND PELVIS TECHNIQUE: Non-contrast CT of the chest was initially obtained. Multidetector CT imaging  through the chest, abdomen and pelvis was performed using the standard protocol during bolus administration of intravenous contrast. Multiplanar reconstructed images and MIPs were obtained and reviewed to evaluate the vascular anatomy. RADIATION DOSE REDUCTION: This exam was performed according to the departmental dose-optimization program which includes automated exposure control, adjustment of the mA and/or kV according to patient size and/or use of iterative reconstruction technique. CONTRAST:  49m OMNIPAQUE IOHEXOL 350 MG/ML SOLN COMPARISON:  06/19/2021. FINDINGS: CTA CHEST FINDINGS Cardiovascular: The heart is enlarged and there is no pericardial effusion. A few scattered coronary artery calcifications are noted. The distal tip of a right internal jugular central venous catheter terminates in the right atrium. There is atherosclerotic calcification of the aorta with mural thrombus. The ascending aorta measures up to 3.7 cm in diameter. Evaluation for dissection is limited due to mixing artifact in the aorta. No definite dissection is seen. The pulmonary trunk is distended suggesting underlying pulmonary artery hypertension. Mediastinum/Nodes: No mediastinal, hilar, or  axillary lymphadenopathy by size criteria. The thyroid gland, trachea, and esophagus are within normal limits. Lungs/Pleura: There is a moderate pleural effusion on the right. There is a trace left pleural effusion. Hazy ground-glass opacities are noted in the lungs bilaterally. Strandy atelectasis is noted at the lung bases. No pneumothorax. Musculoskeletal: Sternotomy wires are present over the midline. Degenerative changes are present in the thoracic spine. No acute osseous abnormality. Review of the MIP images confirms the above findings. CTA ABDOMEN AND PELVIS FINDINGS VASCULAR Aorta: Atherosclerotic calcification with mural thrombus. Normal caliber aorta without aneurysm, dissection, vasculitis or significant stenosis. Celiac: Moderate narrowing of the origin of the celiac artery which may be due to respiratory phase. Patent without evidence of aneurysm, dissection, or vasculitis. SMA: Patent without evidence of aneurysm, dissection, vasculitis or significant stenosis. Renals: Bilateral renal arteries are patent without evidence of aneurysm or dissection. Evaluation of the distal renal arteries is limited due to suboptimal timing of contrast bolus IMA: Patent, not well evaluated due to timing of contrast bolus. Inflow: Atherosclerotic calcification. Patent without evidence of aneurysm, dissection, vasculitis or significant stenosis. Veins: No obvious venous abnormality within the limitations of this arterial phase study. Review of the MIP images confirms the above findings. NON-VASCULAR Hepatobiliary: There is reflux of contrast into the inferior vena cava and hepatic veins. A vague hypodensity is noted in the left lobe of the liver measuring 1.9 cm, axial image 65. The gallbladder is without stones. No biliary ductal dilatation Pancreas: Limited evaluation due to hardware artifact. No pancreatic ductal dilatation or surrounding inflammatory changes. Spleen: Normal in size without focal abnormality.  Adrenals/Urinary Tract: The adrenal glands are within normal limits. Renal atrophy is present bilaterally. Evaluation of the kidneys is limited due to hardware artifact. No hydronephrosis. Excreted contrast is present in the urinary bladder and there is minimal distention. Stomach/Bowel: The stomach is within normal limits. No bowel obstruction, free air, or pneumatosis. The individual bowel loops are not well delineated. The appendix is not definitely seen on exam. Lymphatic: No definite abdominal or pelvic lymphadenopathy. Reproductive: Calcified lesions are present in the uterus likely representing fibroids. No adnexal mass is seen. Other: No ascites.  Anasarca is noted. Musculoskeletal: Degenerative changes are present in the lumbar spine. Fixation hardware at L2 and L3 and L4-L5. No acute osseous abnormality. Review of the MIP images confirms the above findings. IMPRESSION: 1. No definite evidence of aortic aneurysm. There is heterogeneous enhancement of the aortic arch and descending aorta which may be related to turbulent flow  and timing of contrast bolus, less likely dissection. If there is clinical concern for aortic dissection, repeat CT chest may be beneficial for further evaluation. 2. Small right pleural effusion with trace left pleural effusion. 3. Hazy ground-glass opacities in the lungs bilaterally, possible edema or infiltrate. 4. Cardiomegaly with coronary artery calcifications. 5. No acute process in the abdomen and pelvis. 6. Anasarca. 7. Remaining incidental findings as described above. Electronically Signed   By: Brett Fairy M.D.   On: 12/07/2021 22:36   DG Chest 2 View  Result Date: 12/07/2021 CLINICAL DATA:  Shortness of breath.  Missed hemodialysis yesterday. EXAM: CHEST - 2 VIEW COMPARISON:  Radiograph 11/18/2021 FINDINGS: Right-sided dialysis catheter tip in the right atrium. Post median sternotomy. Similar cardiomegaly. Increased pulmonary edema from prior radiograph. Minor scarring  in the left mid lung. Small bilateral pleural effusions. Mild fluid in the fissures. No pneumothorax or confluent airspace disease. IMPRESSION: 1. Increased pulmonary edema from prior radiograph. Small bilateral pleural effusions. 2. Similar cardiomegaly. Electronically Signed   By: Keith Rake M.D.   On: 12/07/2021 21:14   CT ABDOMEN PELVIS WO CONTRAST  Result Date: 11/18/2021 CLINICAL DATA:  Nonlocalized abdominal pain. EXAM: CT ABDOMEN AND PELVIS WITHOUT CONTRAST TECHNIQUE: Multidetector CT imaging of the abdomen and pelvis was performed following the standard protocol without IV contrast. RADIATION DOSE REDUCTION: This exam was performed according to the departmental dose-optimization program which includes automated exposure control, adjustment of the mA and/or kV according to patient size and/or use of iterative reconstruction technique. COMPARISON:  CTA abdomen and pelvis 06/08/2019 FINDINGS: Lower chest: Heart is enlarged. Small to moderate right pleural effusion. Hepatobiliary: No suspicious focal abnormality in the liver on this study without intravenous contrast. Gallbladder not well seen but no evidence for calcified gallstones. No intrahepatic or extrahepatic biliary dilation. Pancreas: No focal mass lesion. No dilatation of the main duct. No intraparenchymal cyst. No peripancreatic edema. Spleen: No splenomegaly. No focal mass lesion. Adrenals/Urinary Tract: No adrenal nodule or mass. No hydronephrosis. No definite hydroureter. The urinary bladder appears normal for the degree of distention. Stomach/Bowel: Stomach is nondilated. No small bowel or colonic dilatation. Neither the terminal ileum nor the appendix are clearly demonstrated. Colon is nondilated Vascular/Lymphatic: There is moderate atherosclerotic calcification of the abdominal aorta without aneurysm. No discernible lymphadenopathy in the abdomen or pelvis. Reproductive: Calcified fibroid seen in the uterus. There is no adnexal mass.  Other: Moderate volume ascites with diffuse mesenteric edema. Musculoskeletal: Diffuse body wall edema. Posterolateral left lumbar hernia containing only fat, similar to prior (image 32/3). Patient has a second more anterior left-sided lateral wall hernia extending to the plane between the internal and external oblique muscles (image 31/3). This contains a short segment of descending colon without complicating features. No worrisome lytic or sclerotic osseous abnormality. Status post lumbar fusion. IMPRESSION: 1. Markedly limited study due to lack of oral and intravenous contrast and the associated diffuse mesenteric/body wall edema and intraperitoneal free fluid. Within this limitation, no acute findings in the abdomen or pelvis. 2. Small to moderate right pleural effusion. 3. Stable appearance of left lateral wall hernias. 4. Aortic Atherosclerosis (ICD10-I70.0). Electronically Signed   By: Misty Stanley M.D.   On: 11/18/2021 10:26   DG Chest Portable 1 View  Result Date: 11/18/2021 CLINICAL DATA:  acute dyspnea EXAM: PORTABLE CHEST 1 VIEW COMPARISON:  Oct 29, 2021 FINDINGS: Again seen is a right transjugular tunneled dialysis catheter with its tip in the proximal right atrium. Sternotomy wires. Cardiomegaly. Mild central  pulmonary vascular congestion without significant interval change. Blunting of the bilateral CP angles suspicious for small pleural effusion. No consolidation or atelectasis. IMPRESSION: There has been no significant interval change in the cardiomegaly and mild pulmonary vascular congestion. Likely small bilateral pleural effusion. Electronically Signed   By: Frazier Richards M.D.   On: 11/18/2021 08:01    Microbiology: No results found for this or any previous visit (from the past 240 hour(s)).   Labs: Basic Metabolic Panel: Recent Labs  Lab 12/07/21 1948 12/07/21 1957 12/08/21 0404  NA 141 141 140  K 4.4 4.2 4.0  CL 102 104 101  CO2 23  --  22  GLUCOSE 149* 143* 108*  BUN 59*  53* 61*  CREATININE 10.98* 11.90* 11.40*  CALCIUM 10.0  --  9.6   Liver Function Tests: Recent Labs  Lab 12/07/21 1948  AST 15  ALT 13  ALKPHOS 89  BILITOT 0.9  PROT 7.0  ALBUMIN 3.7   Recent Labs  Lab 12/07/21 1948  LIPASE 84*   No results for input(s): "AMMONIA" in the last 168 hours. CBC: Recent Labs  Lab 12/07/21 1948 12/07/21 1957 12/08/21 0404  WBC 5.3  --  5.5  NEUTROABS 3.5  --   --   HGB 10.1* 11.6* 9.7*  HCT 32.2* 34.0* 30.2*  MCV 91.5  --  89.3  PLT 229  --  230   Cardiac Enzymes: No results for input(s): "CKTOTAL", "CKMB", "CKMBINDEX", "TROPONINI" in the last 168 hours. BNP: BNP (last 3 results) Recent Labs    06/20/21 1808 07/15/21 2029 10/29/21 0840  BNP >4,500.0* >4,500.0* >4,500.0*    ProBNP (last 3 results) No results for input(s): "PROBNP" in the last 8760 hours.  CBG: No results for input(s): "GLUCAP" in the last 168 hours.     Signed:  Desma Maxim MD.  [facility] 12/08/2021, 3:57 PM

## 2021-12-08 NOTE — Progress Notes (Signed)
Patient presented to the ER overnight with hypertension and dyspnea.  Blood pressure was initially very elevated and have improved somewhat, now 341D to 622W systolic over 97L to 892J diastolic.  She is requiring 2 L nasal cannula.  She is ESRD At [facility], missed treatment 7/8.  On 7/6 UF 1 kg above her EDW, only did 2h28mn and her blood pressures were fairly normal at the time of treatment end.  HD Rx: TIW, 400/a1.5, 2K 2Ca, 3h 461m, TDC  At presentation here potassium is 4.2, BUN 53.  Hemoglobin 11.6.  CTA of the chest abdomen and pelvis with no evidence of aortic aneurysm, small right and trace left pleural effusions, hazy groundglass opacities in the lungs bilaterally suggestive edema or infiltrate.  Anasarca noted.  2 view chest x-ray in the ED had evidence of increased pulmonary edema with small bilateral effusions.  Plan to provide HD this morning on schedule. 3h 4559m 2K, 3L UF, TDC, no heparin

## 2021-12-08 NOTE — Progress Notes (Signed)
Cool drink given.

## 2021-12-09 LAB — HEPATITIS B SURFACE ANTIBODY, QUANTITATIVE: Hep B S AB Quant (Post): 99 m[IU]/mL (ref >9.9)

## 2021-12-10 NOTE — TOC Transition Note (Signed)
Transition of care contact from inpatient facility  Date of discharge: 12/08/21 Date of contact: 12/10/21 Method: Tried Phone Call Spoke to: No Answer  Tried calling patient to discuss transition of care from recent inpatient hospitalization but she did not pick up the phone. Unable to leave voicemail. Message stated: "phone call cannot be completed as dialed".   Tobie Poet, NP

## 2021-12-11 DIAGNOSIS — Z992 Dependence on renal dialysis: Secondary | ICD-10-CM | POA: Diagnosis not present

## 2021-12-11 DIAGNOSIS — N186 End stage renal disease: Secondary | ICD-10-CM | POA: Diagnosis not present

## 2021-12-11 DIAGNOSIS — N2581 Secondary hyperparathyroidism of renal origin: Secondary | ICD-10-CM | POA: Diagnosis not present

## 2021-12-13 DIAGNOSIS — Z992 Dependence on renal dialysis: Secondary | ICD-10-CM | POA: Diagnosis not present

## 2021-12-13 DIAGNOSIS — N186 End stage renal disease: Secondary | ICD-10-CM | POA: Diagnosis not present

## 2021-12-13 DIAGNOSIS — N2581 Secondary hyperparathyroidism of renal origin: Secondary | ICD-10-CM | POA: Diagnosis not present

## 2021-12-16 DIAGNOSIS — L299 Pruritus, unspecified: Secondary | ICD-10-CM | POA: Diagnosis not present

## 2021-12-16 DIAGNOSIS — Z992 Dependence on renal dialysis: Secondary | ICD-10-CM | POA: Diagnosis not present

## 2021-12-16 DIAGNOSIS — N2581 Secondary hyperparathyroidism of renal origin: Secondary | ICD-10-CM | POA: Diagnosis not present

## 2021-12-16 DIAGNOSIS — N186 End stage renal disease: Secondary | ICD-10-CM | POA: Diagnosis not present

## 2021-12-18 DIAGNOSIS — L299 Pruritus, unspecified: Secondary | ICD-10-CM | POA: Diagnosis not present

## 2021-12-18 DIAGNOSIS — N2581 Secondary hyperparathyroidism of renal origin: Secondary | ICD-10-CM | POA: Diagnosis not present

## 2021-12-18 DIAGNOSIS — Z992 Dependence on renal dialysis: Secondary | ICD-10-CM | POA: Diagnosis not present

## 2021-12-18 DIAGNOSIS — N186 End stage renal disease: Secondary | ICD-10-CM | POA: Diagnosis not present

## 2021-12-20 DIAGNOSIS — N2581 Secondary hyperparathyroidism of renal origin: Secondary | ICD-10-CM | POA: Diagnosis not present

## 2021-12-20 DIAGNOSIS — Z992 Dependence on renal dialysis: Secondary | ICD-10-CM | POA: Diagnosis not present

## 2021-12-20 DIAGNOSIS — L299 Pruritus, unspecified: Secondary | ICD-10-CM | POA: Diagnosis not present

## 2021-12-20 DIAGNOSIS — N186 End stage renal disease: Secondary | ICD-10-CM | POA: Diagnosis not present

## 2021-12-24 ENCOUNTER — Inpatient Hospital Stay (HOSPITAL_COMMUNITY)
Admission: EM | Admit: 2021-12-24 | Discharge: 2021-12-30 | DRG: 070 | Disposition: A | Discharge status: Home / Self Care | Payer: Medicare HMO | Attending: Internal Medicine | Admitting: Internal Medicine

## 2021-12-24 ENCOUNTER — Emergency Department (HOSPITAL_COMMUNITY): Payer: Medicare HMO

## 2021-12-24 ENCOUNTER — Encounter (HOSPITAL_COMMUNITY): Payer: Self-pay

## 2021-12-24 ENCOUNTER — Other Ambulatory Visit: Payer: Self-pay

## 2021-12-24 DIAGNOSIS — E875 Hyperkalemia: Secondary | ICD-10-CM | POA: Diagnosis present

## 2021-12-24 DIAGNOSIS — Z8673 Personal history of transient ischemic attack (TIA), and cerebral infarction without residual deficits: Secondary | ICD-10-CM

## 2021-12-24 DIAGNOSIS — Z87891 Personal history of nicotine dependence: Secondary | ICD-10-CM

## 2021-12-24 DIAGNOSIS — R404 Transient alteration of awareness: Secondary | ICD-10-CM | POA: Diagnosis not present

## 2021-12-24 DIAGNOSIS — N186 End stage renal disease: Secondary | ICD-10-CM | POA: Diagnosis not present

## 2021-12-24 DIAGNOSIS — Z992 Dependence on renal dialysis: Secondary | ICD-10-CM | POA: Diagnosis not present

## 2021-12-24 DIAGNOSIS — J449 Chronic obstructive pulmonary disease, unspecified: Secondary | ICD-10-CM | POA: Diagnosis present

## 2021-12-24 DIAGNOSIS — I160 Hypertensive urgency: Secondary | ICD-10-CM | POA: Diagnosis not present

## 2021-12-24 DIAGNOSIS — K219 Gastro-esophageal reflux disease without esophagitis: Secondary | ICD-10-CM | POA: Diagnosis present

## 2021-12-24 DIAGNOSIS — Z91158 Patient's noncompliance with renal dialysis for other reason: Secondary | ICD-10-CM

## 2021-12-24 DIAGNOSIS — M898X9 Other specified disorders of bone, unspecified site: Secondary | ICD-10-CM | POA: Diagnosis present

## 2021-12-24 DIAGNOSIS — I480 Paroxysmal atrial fibrillation: Secondary | ICD-10-CM | POA: Diagnosis present

## 2021-12-24 DIAGNOSIS — I119 Hypertensive heart disease without heart failure: Secondary | ICD-10-CM | POA: Diagnosis present

## 2021-12-24 DIAGNOSIS — Z888 Allergy status to other drugs, medicaments and biological substances status: Secondary | ICD-10-CM

## 2021-12-24 DIAGNOSIS — Z66 Do not resuscitate: Secondary | ICD-10-CM | POA: Diagnosis present

## 2021-12-24 DIAGNOSIS — I5022 Chronic systolic (congestive) heart failure: Secondary | ICD-10-CM | POA: Diagnosis present

## 2021-12-24 DIAGNOSIS — I674 Hypertensive encephalopathy: Secondary | ICD-10-CM | POA: Diagnosis not present

## 2021-12-24 DIAGNOSIS — Z515 Encounter for palliative care: Secondary | ICD-10-CM | POA: Diagnosis not present

## 2021-12-24 DIAGNOSIS — Z981 Arthrodesis status: Secondary | ICD-10-CM

## 2021-12-24 DIAGNOSIS — Z20822 Contact with and (suspected) exposure to covid-19: Secondary | ICD-10-CM | POA: Diagnosis present

## 2021-12-24 DIAGNOSIS — G4700 Insomnia, unspecified: Secondary | ICD-10-CM | POA: Diagnosis present

## 2021-12-24 DIAGNOSIS — Z886 Allergy status to analgesic agent status: Secondary | ICD-10-CM

## 2021-12-24 DIAGNOSIS — I132 Hypertensive heart and chronic kidney disease with heart failure and with stage 5 chronic kidney disease, or end stage renal disease: Secondary | ICD-10-CM | POA: Diagnosis not present

## 2021-12-24 DIAGNOSIS — N250 Renal osteodystrophy: Secondary | ICD-10-CM | POA: Diagnosis not present

## 2021-12-24 DIAGNOSIS — Z91013 Allergy to seafood: Secondary | ICD-10-CM | POA: Diagnosis not present

## 2021-12-24 DIAGNOSIS — I161 Hypertensive emergency: Secondary | ICD-10-CM | POA: Diagnosis present

## 2021-12-24 DIAGNOSIS — Z91148 Patient's other noncompliance with medication regimen for other reason: Secondary | ICD-10-CM

## 2021-12-24 DIAGNOSIS — Z841 Family history of disorders of kidney and ureter: Secondary | ICD-10-CM

## 2021-12-24 DIAGNOSIS — Z9012 Acquired absence of left breast and nipple: Secondary | ICD-10-CM

## 2021-12-24 DIAGNOSIS — R64 Cachexia: Secondary | ICD-10-CM | POA: Diagnosis not present

## 2021-12-24 DIAGNOSIS — Z8249 Family history of ischemic heart disease and other diseases of the circulatory system: Secondary | ICD-10-CM

## 2021-12-24 DIAGNOSIS — F1410 Cocaine abuse, uncomplicated: Secondary | ICD-10-CM | POA: Diagnosis present

## 2021-12-24 DIAGNOSIS — Z885 Allergy status to narcotic agent status: Secondary | ICD-10-CM | POA: Diagnosis not present

## 2021-12-24 DIAGNOSIS — Z853 Personal history of malignant neoplasm of breast: Secondary | ICD-10-CM | POA: Diagnosis not present

## 2021-12-24 DIAGNOSIS — Z7901 Long term (current) use of anticoagulants: Secondary | ICD-10-CM

## 2021-12-24 DIAGNOSIS — R6889 Other general symptoms and signs: Secondary | ICD-10-CM | POA: Diagnosis not present

## 2021-12-24 DIAGNOSIS — I120 Hypertensive chronic kidney disease with stage 5 chronic kidney disease or end stage renal disease: Secondary | ICD-10-CM | POA: Diagnosis not present

## 2021-12-24 DIAGNOSIS — G9341 Metabolic encephalopathy: Secondary | ICD-10-CM | POA: Diagnosis not present

## 2021-12-24 DIAGNOSIS — R0689 Other abnormalities of breathing: Secondary | ICD-10-CM | POA: Diagnosis not present

## 2021-12-24 DIAGNOSIS — G8929 Other chronic pain: Secondary | ICD-10-CM | POA: Diagnosis present

## 2021-12-24 DIAGNOSIS — D631 Anemia in chronic kidney disease: Secondary | ICD-10-CM | POA: Diagnosis not present

## 2021-12-24 DIAGNOSIS — Z743 Need for continuous supervision: Secondary | ICD-10-CM | POA: Diagnosis not present

## 2021-12-24 DIAGNOSIS — R4182 Altered mental status, unspecified: Secondary | ICD-10-CM | POA: Diagnosis not present

## 2021-12-24 DIAGNOSIS — R54 Age-related physical debility: Secondary | ICD-10-CM | POA: Diagnosis present

## 2021-12-24 DIAGNOSIS — Z79899 Other long term (current) drug therapy: Secondary | ICD-10-CM

## 2021-12-24 DIAGNOSIS — I129 Hypertensive chronic kidney disease with stage 1 through stage 4 chronic kidney disease, or unspecified chronic kidney disease: Secondary | ICD-10-CM | POA: Diagnosis not present

## 2021-12-24 DIAGNOSIS — Z7189 Other specified counseling: Secondary | ICD-10-CM | POA: Diagnosis not present

## 2021-12-24 DIAGNOSIS — G9340 Encephalopathy, unspecified: Secondary | ICD-10-CM | POA: Diagnosis not present

## 2021-12-24 LAB — CBC
HCT: 36.5 % (ref 36.0–46.0)
Hemoglobin: 11.4 g/dL — ABNORMAL LOW (ref 12.0–15.0)
MCH: 29.5 pg (ref 26.0–34.0)
MCHC: 31.2 g/dL (ref 30.0–36.0)
MCV: 94.6 fL (ref 80.0–100.0)
Platelets: 244 10*3/uL (ref 150–400)
RBC: 3.86 MIL/uL — ABNORMAL LOW (ref 3.87–5.11)
RDW: 21.3 % — ABNORMAL HIGH (ref 11.5–15.5)
WBC: 4.8 10*3/uL (ref 4.0–10.5)
nRBC: 2.7 % — ABNORMAL HIGH (ref 0.0–0.2)

## 2021-12-24 LAB — COMPREHENSIVE METABOLIC PANEL
ALT: 32 U/L (ref 0–44)
AST: 33 U/L (ref 15–41)
Albumin: 4.2 g/dL (ref 3.5–5.0)
Alkaline Phosphatase: 66 U/L (ref 38–126)
Anion gap: 24 — ABNORMAL HIGH (ref 5–15)
BUN: 93 mg/dL — ABNORMAL HIGH (ref 8–23)
CO2: 21 mmol/L — ABNORMAL LOW (ref 22–32)
Calcium: 10.1 mg/dL (ref 8.9–10.3)
Chloride: 104 mmol/L (ref 98–111)
Creatinine, Ser: 13.37 mg/dL — ABNORMAL HIGH (ref 0.44–1.00)
GFR, Estimated: 3 mL/min — ABNORMAL LOW (ref >60)
Glucose, Bld: 104 mg/dL — ABNORMAL HIGH (ref 70–99)
Potassium: 5.9 mmol/L — ABNORMAL HIGH (ref 3.5–5.1)
Sodium: 149 mmol/L — ABNORMAL HIGH (ref 135–145)
Total Bilirubin: 1.1 mg/dL (ref 0.3–1.2)
Total Protein: 7.6 g/dL (ref 6.5–8.1)

## 2021-12-24 LAB — I-STAT CHEM 8, ED
BUN: 89 mg/dL — ABNORMAL HIGH (ref 8–23)
Calcium, Ion: 1.08 mmol/L — ABNORMAL LOW (ref 1.15–1.40)
Chloride: 108 mmol/L (ref 98–111)
Creatinine, Ser: 14.7 mg/dL — ABNORMAL HIGH (ref 0.44–1.00)
Glucose, Bld: 105 mg/dL — ABNORMAL HIGH (ref 70–99)
HCT: 38 % (ref 36.0–46.0)
Hemoglobin: 12.9 g/dL (ref 12.0–15.0)
Potassium: 5.6 mmol/L — ABNORMAL HIGH (ref 3.5–5.1)
Sodium: 145 mmol/L (ref 135–145)
TCO2: 22 mmol/L (ref 22–32)

## 2021-12-24 LAB — DIFFERENTIAL
Abs Immature Granulocytes: 0 10*3/uL (ref 0.00–0.07)
Basophils Absolute: 0 10*3/uL (ref 0.0–0.1)
Basophils Relative: 1 %
Eosinophils Absolute: 0 10*3/uL (ref 0.0–0.5)
Eosinophils Relative: 0 %
Lymphocytes Relative: 7 %
Lymphs Abs: 0.3 10*3/uL — ABNORMAL LOW (ref 0.7–4.0)
Monocytes Absolute: 0.2 10*3/uL (ref 0.1–1.0)
Monocytes Relative: 4 %
Neutro Abs: 4.2 10*3/uL (ref 1.7–7.7)
Neutrophils Relative %: 88 %
nRBC: 5 /100 WBC — ABNORMAL HIGH

## 2021-12-24 LAB — PHOSPHORUS: Phosphorus: 11.1 mg/dL — ABNORMAL HIGH (ref 2.5–4.6)

## 2021-12-24 LAB — ETHANOL: Alcohol, Ethyl (B): <10 mg/dL (ref <10)

## 2021-12-24 LAB — AMMONIA: Ammonia: 26 umol/L (ref 9–35)

## 2021-12-24 LAB — APTT: aPTT: 36 seconds (ref 24–36)

## 2021-12-24 LAB — PROTIME-INR
INR: 1.5 — ABNORMAL HIGH (ref 0.8–1.2)
Prothrombin Time: 17.9 seconds — ABNORMAL HIGH (ref 11.4–15.2)

## 2021-12-24 LAB — RESP PANEL BY RT-PCR (FLU A&B, COVID) ARPGX2
Influenza A by PCR: NEGATIVE
Influenza B by PCR: NEGATIVE
SARS Coronavirus 2 by RT PCR: NEGATIVE

## 2021-12-24 LAB — CBG MONITORING, ED: Glucose-Capillary: 100 mg/dL — ABNORMAL HIGH (ref 70–99)

## 2021-12-24 MED ORDER — HYDRALAZINE HCL 25 MG PO TABS
25.0000 mg | ORAL_TABLET | Freq: Two times a day (BID) | ORAL | Status: DC
  Administered 2021-12-24 – 2021-12-25 (×2): 25 mg via ORAL
  Filled 2021-12-24 (×3): qty 1

## 2021-12-24 MED ORDER — CARVEDILOL 12.5 MG PO TABS
12.5000 mg | ORAL_TABLET | Freq: Two times a day (BID) | ORAL | Status: DC
  Administered 2021-12-24 – 2021-12-29 (×9): 12.5 mg via ORAL
  Filled 2021-12-24 (×11): qty 1

## 2021-12-24 MED ORDER — SODIUM CHLORIDE 0.9% FLUSH
3.0000 mL | INTRAVENOUS | Status: DC | PRN
Start: 2021-12-24 — End: 2021-12-30

## 2021-12-24 MED ORDER — HEPARIN SODIUM (PORCINE) 5000 UNIT/ML IJ SOLN
5000.0000 [IU] | Freq: Three times a day (TID) | INTRAMUSCULAR | Status: DC
  Administered 2021-12-24 – 2021-12-26 (×5): 5000 [IU] via SUBCUTANEOUS
  Filled 2021-12-24 (×6): qty 1

## 2021-12-24 MED ORDER — LOSARTAN POTASSIUM 50 MG PO TABS
50.0000 mg | ORAL_TABLET | Freq: Two times a day (BID) | ORAL | Status: DC
  Administered 2021-12-24: 50 mg via ORAL
  Filled 2021-12-24: qty 1

## 2021-12-24 MED ORDER — ANTICOAGULANT SODIUM CITRATE 4% (200MG/5ML) IV SOLN
5.0000 mL | Status: DC | PRN
  Filled 2021-12-24: qty 5

## 2021-12-24 MED ORDER — ACETAMINOPHEN 325 MG PO TABS
650.0000 mg | ORAL_TABLET | ORAL | Status: DC | PRN
  Administered 2021-12-29: 650 mg via ORAL
  Filled 2021-12-24: qty 2

## 2021-12-24 MED ORDER — SODIUM CHLORIDE 0.9 % IV SOLN: 250.0000 mL | INTRAVENOUS | Status: DC | PRN

## 2021-12-24 MED ORDER — CHLORHEXIDINE GLUCONATE CLOTH 2 % EX PADS
6.0000 | MEDICATED_PAD | Freq: Every day | CUTANEOUS | Status: DC
Start: 2021-12-25 — End: 2021-12-29
  Administered 2021-12-24 – 2021-12-29 (×5): 6 via TOPICAL

## 2021-12-24 MED ORDER — ALTEPLASE 2 MG IJ SOLR: 2.0000 mg | Freq: Once | INTRAMUSCULAR | Status: DC | PRN

## 2021-12-24 MED ORDER — POLYETHYLENE GLYCOL 3350 17 G PO PACK: 17.0000 g | PACK | Freq: Every day | ORAL | Status: DC | PRN

## 2021-12-24 MED ORDER — CLEVIDIPINE BUTYRATE 0.5 MG/ML IV EMUL
0.0000 mg/h | INTRAVENOUS | Status: DC
  Administered 2021-12-24: 2 mg/h via INTRAVENOUS
  Filled 2021-12-24: qty 50

## 2021-12-24 MED ORDER — HYDRALAZINE HCL 25 MG PO TABS
25.0000 mg | ORAL_TABLET | Freq: Two times a day (BID) | ORAL | Status: DC
Start: 2021-12-24 — End: 2021-12-24
  Filled 2021-12-24: qty 1

## 2021-12-24 MED ORDER — CARVEDILOL 3.125 MG PO TABS
6.2500 mg | ORAL_TABLET | Freq: Two times a day (BID) | ORAL | Status: DC
Start: 2021-12-24 — End: 2021-12-24

## 2021-12-24 MED ORDER — LABETALOL HCL 5 MG/ML IV SOLN
10.0000 mg | Freq: Once | INTRAVENOUS | Status: AC
  Administered 2021-12-24: 10 mg via INTRAVENOUS
  Filled 2021-12-24: qty 4

## 2021-12-24 MED ORDER — AMLODIPINE BESYLATE 10 MG PO TABS
10.0000 mg | ORAL_TABLET | Freq: Every day | ORAL | Status: DC
  Administered 2021-12-24 – 2021-12-29 (×5): 10 mg via ORAL
  Filled 2021-12-24 (×4): qty 1
  Filled 2021-12-24: qty 2
  Filled 2021-12-24: qty 1

## 2021-12-24 MED ORDER — SODIUM ZIRCONIUM CYCLOSILICATE 10 G PO PACK
10.0000 g | PACK | Freq: Three times a day (TID) | ORAL | Status: AC
  Administered 2021-12-24 (×2): 10 g via ORAL
  Filled 2021-12-24 (×2): qty 1

## 2021-12-24 MED ORDER — ONDANSETRON HCL 4 MG/2ML IJ SOLN
4.0000 mg | Freq: Four times a day (QID) | INTRAMUSCULAR | Status: DC | PRN
Start: 2021-12-24 — End: 2021-12-24

## 2021-12-24 MED ORDER — DOCUSATE SODIUM 100 MG PO CAPS: 100.0000 mg | ORAL_CAPSULE | Freq: Two times a day (BID) | ORAL | Status: DC | PRN

## 2021-12-24 MED ORDER — HEPARIN SODIUM (PORCINE) 1000 UNIT/ML DIALYSIS
1000.0000 [IU] | INTRAMUSCULAR | Status: DC | PRN
  Filled 2021-12-24: qty 1

## 2021-12-24 MED ORDER — ISOSORBIDE MONONITRATE ER 30 MG PO TB24
30.0000 mg | ORAL_TABLET | Freq: Every day | ORAL | Status: DC
  Administered 2021-12-24 – 2021-12-29 (×5): 30 mg via ORAL
  Filled 2021-12-24 (×6): qty 1

## 2021-12-24 MED ORDER — SODIUM CHLORIDE 0.9% FLUSH
3.0000 mL | Freq: Two times a day (BID) | INTRAVENOUS | Status: DC
Start: 2021-12-24 — End: 2021-12-30
  Administered 2021-12-24 – 2021-12-29 (×11): 3 mL via INTRAVENOUS

## 2021-12-24 MED ORDER — HYDRALAZINE HCL 20 MG/ML IJ SOLN
5.0000 mg | Freq: Four times a day (QID) | INTRAMUSCULAR | Status: DC | PRN
Start: 2021-12-24 — End: 2021-12-30
  Administered 2021-12-24 – 2021-12-25 (×2): 5 mg via INTRAVENOUS
  Filled 2021-12-24 (×2): qty 1

## 2021-12-24 NOTE — H&P (Addendum)
History and Physical    Monique Henderson SWH:[id number] DOB: 1950/10/12 DOA: 12/24/2021  PCP: Pcp, No (Confirm with patient/family/NH records and if not entered, this has to be entered at [facility] point of entry) Patient coming from: Home  I have personally briefly reviewed patient's old medical records in [place]  Chief Complaint: Feeling tired.  HPI: Monique Henderson is a 71 y.o. female with medical history significant of ESRD on HD TTS, refractory HTN, PAF on Eliquis, chronic HFrEF, noncompliance, history of polysubstance abuse, aortic aneurysm and dissection status post repair, presented with after mentations.  Is sleepy and confused, unable to provide any history. I tried to reach patient family members including his son, significant other and sister, but none of them picked up the phone and unable to leave any messages at this point, so of the history obtained by talking to ED staff.  Patient was found sleepy and confused since yesterday.  Looks like last dialysis was last Saturday and patient did not go to yesterday at dialysis.  And there is also a suspicion that patient has not been taking her blood pressure medications as well.  As of now, patient complaining about feeling tired and sleepy, denies any chest pain no shortness of breath no headache or vision changes, denies any weakness or numbness of any of the limbs.  ED Course: Blood pressure significant elevated, after multiple IV push BP meds, Clobetex drip was started to control BP.  Negative for acute findings.  Chest x-ray negative for pulmonary congestion.  Review of Systems: Unable to perform, patient is confused.  Past Medical History:  Diagnosis Date   AF (paroxysmal atrial fibrillation) ([place]) 05/29/2019   on Coumadin   Aortic atherosclerosis ([place]) 07/05/2019   Aortic dissection ([place]) 04/04/2019   s/p repair   Bone spur 2008   Right calcaneal foot spur   Breast cancer ([place]) 2004   Ductal carcinoma in situ of  the left breast; S/P left partial mastectomy 02/26/2003; S/P re-excision of cranial and lateral margins11/18/2004.radiation   Cerebral thrombosis with cerebral infarction 05/22/2019   Chronic low back pain 06/22/2016   Chronic obstructive lung disease ([place]) 01/16/2017   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 Autumn Messing; S/P re-excision of superior and inferior margins 10/27/2012.    ESRD on hemodialysis ([place]) 05/29/2019   Essential hypertension 09/16/2006   GERD 09/16/2006   Hepatitis C    treated and RNA confirmed not detectable 01/2017   Insomnia 03/14/2015   Malnutrition of moderate degree 05/19/2019   Non compliance with medical treatment 12/04/2017   Normocytic anemia    With thrombocytosis   Osteoarthritis    Right ureteral stone 2002   S/P lumbar spinal fusion 01/18/2014   S/P lumbar decompressive laminectomy, fusion, and plating for lumbar spinal stenosis on 05/27/2009 by Dr. Eustace Moore.  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 Eustace Moore.     Tobacco use disorder 04/19/2009   Uterine fibroid    Wears dentures    top    Past Surgical History:  Procedure Laterality Date   ANTERIOR LAT LUMBAR FUSION N/A 01/18/2014   Procedure: ANTERIOR LATERAL LUMBAR FUSION LUMBAR TWO-THREE;  Surgeon: Eustace Moore, MD;  Location: [place] [facility];  Service: Neurosurgery;  Laterality: N/A;  ANTERIOR LUMBAR FUSION  01/18/2014   AV FISTULA PLACEMENT Left 04/20/2019   Procedure: ARTERIOVENOUS (AV) FISTULA CREATION;  Surgeon: [name], MD;  Location: [place];  Service: Vascular;  Laterality: Left;   BACK SURGERY     BREAST LUMPECTOMY Left 01/2003   BREAST LUMPECTOMY Right 2014   BREAST LUMPECTOMY WITH NEEDLE LOCALIZATION AND  AXILLARY SENTINEL LYMPH NODE BX Right 10/13/2012   Procedure: BREAST LUMPECTOMY WITH NEEDLE LOCALIZATION;  Surgeon: [name], MD;  Location: [place];  Service: General;  Laterality: Right;  Right breast wire localized lumpectomy   INSERTION OF DIALYSIS CATHETER Right 04/20/2019   Procedure: INSERTION OF DIALYSIS CATHETER, right internal jugular;  Surgeon: [name], MD;  Location: [place];  Service: Vascular;  Laterality: Right;   INSERTION OF DIALYSIS CATHETER Right 09/24/2020   Procedure: INSERTION OF TUNNELED DIALYSIS CATHETER;  Surgeon: Rosetta Posner, MD;  Location: [place];  Service: Vascular;  Laterality: Right;   IR FLUORO GUIDE CV LINE RIGHT  09/22/2020   IR THORACENTESIS ASP PLEURAL SPACE W/IMG GUIDE  05/19/2019   IR US GUIDE VASC ACCESS LEFT  09/22/2020   IR US GUIDE VASC ACCESS RIGHT  09/22/2020   IR VENOCAVAGRAM SVC  09/22/2020   LAMINECTOMY  05/27/2009   Lumbar decompressive laminectomy, fusion and plating for lumbar spinal stensosis   LIGATION OF ARTERIOVENOUS  FISTULA Left 09/24/2020   Procedure: LIGATION OF LEFT ARM ARTERIOVENOUS  FISTULA;  Surgeon: Rosetta Posner, MD;  Location: [place];  Service: Vascular;  Laterality: Left;   LUMBAR LAMINECTOMY/DECOMPRESSION MICRODISCECTOMY Left 03/23/2013   Procedure: LUMBAR LAMINECTOMY/DECOMPRESSION MICRODISCECTOMY 1 LEVEL;  Surgeon: Eustace Moore, MD;  Location: [facility];  Service: Neurosurgery;  Laterality: Left;  LUMBAR LAMINECTOMY/DECOMPRESSION MICRODISCECTOMY 1 LEVEL   MASTECTOMY, PARTIAL Left 02/26/2003   ; S/P re-excision of cranial and lateral margins 04/19/2003.    RE-EXCISION OF BREAST CANCER,SUPERIOR MARGINS Right 10/27/2012   Procedure: RE-EXCISION OF BREAST CANCER,SUPERIOR and inferior MARGINS;  Surgeon: [name], MD;  Location: [place];  Service: General;  Laterality: Right;   RE-EXCISION OF BREAST LUMPECTOMY Left 04/2003   TEE WITHOUT CARDIOVERSION N/A 04/04/2019   Procedure: Transesophageal  Echocardiogram ([place]);  Surgeon: Wonda Olds, MD;  Location: [place];  Service: Open Heart Surgery;  Laterality: N/A;   THORACIC AORTIC ANEURYSM REPAIR N/A 04/04/2019   Procedure: THORACIC ASCENDING ANEURYSM REPAIR (AAA)  USING 28 MM X 30 CM HEMASHIELD PLATINUM VASCULAR GRAFT;  Surgeon: Wonda Olds, MD;  Location: [facility];  Service: Open Heart Surgery;  Laterality: N/A;     reports that she quit smoking about 2 years ago. Her smoking use included cigarettes. She has a 11.00 pack-year smoking history. She has never used smokeless tobacco. She reports that she does not currently use alcohol after a past usage of about 2.0 standard drinks of alcohol per week. She reports that she does not currently use drugs after having used the following drugs: Cocaine.  Allergies  Allergen Reactions   Shrimp [Shellfish Allergy] Shortness Of Breath   Bactroban [Mupirocin] Other (See Comments)    "Sores in nose"   Vicodin [Hydrocodone-Acetaminophen] Itching and Nausea And Vomiting    This is patient's home medication   Eliquis [Apixaban] Itching    Spoke with patient, no rash.  Has tolerated on re-challenge.   Lisinopril Cough   Tylenol [Acetaminophen] Itching    Family History  Problem Relation Age of Onset   Colon cancer Mother  8   Hypertension Mother    Diabetes Sister 64   Hypertension Sister    Diabetes Brother    Hypertension Brother    Diabetes Brother    Hypertension Brother    Kidney disease Son        s/p renal transplant   Hypertension Son    Diabetes Son    Multiple sclerosis Son    Bone cancer Sister 3   Breast cancer Neg Hx    Cervical cancer Neg Hx      Prior to Admission medications   Medication Sig Start Date End Date Taking? Authorizing Provider  albuterol (VENTOLIN HFA) 108 (90 Base) MCG/ACT inhaler Inhale 2 puffs into the lungs every 6 (six) hours as needed for wheezing or shortness of breath. Patient not taking: Reported on 10/29/2021 07/30/20   Maximiano Coss, NP   amLODipine (NORVASC) 10 MG tablet Take 1 tablet (10 mg total) by mouth daily. 02/14/21   Maximiano Coss, NP  apixaban (ELIQUIS) 2.5 MG TABS tablet Take 1 tablet (2.5 mg total) by mouth 2 (two) times daily. 08/24/21   Shary Key, DO  carvedilol (COREG) 6.25 MG tablet Take 1 tablet (6.25 mg total) by mouth 2 (two) times daily with a meal. 12/08/21   Wouk, Ailene Rud, MD  cetirizine (ZYRTEC) 10 MG tablet Take 10 mg by mouth daily as needed for allergies.    [provider]  cloNIDine (CATAPRES - DOSED IN MG/24 HR) 0.1 mg/24hr patch Place 1 patch (0.1 mg total) onto the skin every Saturday. Patient not taking: Reported on 10/29/2021 06/14/21   Caren Griffins, MD  hydrALAZINE (APRESOLINE) 25 MG tablet Take 25 mg by mouth 2 (two) times daily. 11/04/21   [provider]  hydrALAZINE (APRESOLINE) 50 MG tablet Take 1 tablet (50 mg total) by mouth 3 (three) times daily. 06/21/21 10/29/21  Nolberto Hanlon, MD  isosorbide mononitrate (IMDUR) 30 MG 24 hr tablet Take 1 tablet (30 mg total) by mouth daily. 08/17/21   Shary Key, DO  loperamide (IMODIUM) 2 MG capsule Take 2 mg by mouth as needed for diarrhea or loose stools. 07/15/20   [provider]  losartan (COZAAR) 100 MG tablet Take 1 tablet (100 mg total) by mouth daily. 02/14/21   Maximiano Coss, NP  mirtazapine (REMERON) 7.5 MG tablet Take 1 tablet (7.5 mg total) by mouth at bedtime. Patient taking differently: Take 7.5 mg by mouth at bedtime as needed (sleep). 07/15/20   Maximiano Coss, NP  [facility] 4 MG/0.1ML LIQD nasal spray kit Place 1 spray into the nose as needed for opioid reversal. If no response within 3 minutes then repeat once in the other nostril.  Call 911. 05/15/20   [provider]  pregabalin (LYRICA) 50 MG capsule Take 100 mg by mouth at bedtime. 05/07/21   [provider]  rosuvastatin (CRESTOR) 10 MG tablet Take 1 tablet (10 mg total) by mouth daily. Patient not taking: Reported on  10/29/2021 02/14/21 09/15/21  Maximiano Coss, NP  sevelamer carbonate (RENVELA) 2.4 g PACK Take 2.4 g by mouth 3 (three) times daily with meals. Patient not taking: Reported on 10/29/2021 05/15/21   [provider]  sucroferric oxyhydroxide (VELPHORO) 500 MG chewable tablet Chew 1,000 mg by mouth 3 (three) times daily with meals.    [provider]  torsemide (DEMADEX) 20 MG tablet Take 40 mg by mouth daily.    [provider]  VITAMIN D PO Take 5,000 Units by mouth daily.  [provider]    Physical Exam: Vitals:   12/24/21 1652 12/24/21 1653 12/24/21 1715 12/24/21 1733  BP:  (!) 171/86 (!) 192/126 (!) 195/117  Pulse:  75 70   Resp:  (!) 22 (!) 31   Temp: 97.9 F (36.6 C)     TempSrc: Axillary     SpO2:  95% 100%     Constitutional: NAD, calm, comfortable Vitals:   12/24/21 1652 12/24/21 1653 12/24/21 1715 12/24/21 1733  BP:  (!) 171/86 (!) 192/126 (!) 195/117  Pulse:  75 70   Resp:  (!) 22 (!) 31   Temp: 97.9 F (36.6 C)     TempSrc: Axillary     SpO2:  95% 100%    Eyes: PERRL, lids and conjunctivae normal ENMT: Mucous membranes are moist. Posterior pharynx clear of any exudate or lesions.Normal dentition.  Neck: normal, supple, no masses, no thyromegaly Respiratory: clear to auscultation bilaterally, no wheezing, no crackles. Normal respiratory effort. No accessory muscle use.  Cardiovascular: Regular rate and rhythm, no murmurs / rubs / gallops. No extremity edema. 2+ pedal pulses. No carotid bruits.  Abdomen: no tenderness, no masses palpated. No hepatosplenomegaly. Bowel sounds positive.  Musculoskeletal: no clubbing / cyanosis. No joint deformity upper and lower extremities. Good ROM, no contractures. Normal muscle tone.  Skin: no rashes, lesions, ulcers. No induration Neurologic: Awake, moving all limbs, following simple commands Psychiatric: Sleepy, arousable   Labs on Admission: I have personally reviewed following labs and  imaging studies  CBC: Recent Labs  Lab 12/24/21 1341 12/24/21 1353  WBC 4.8  --   NEUTROABS 4.2  --   HGB 11.4* 12.9  HCT 36.5 38.0  MCV 94.6  --   PLT 244  --    Basic Metabolic Panel: Recent Labs  Lab 12/24/21 1341 12/24/21 1353  NA 149* 145  K 5.9* 5.6*  CL 104 108  CO2 21*  --   GLUCOSE 104* 105*  BUN 93* 89*  CREATININE 13.37* 14.70*  CALCIUM 10.1  --    GFR: CrCl cannot be calculated (Unknown ideal weight.). Liver Function Tests: Recent Labs  Lab 12/24/21 1341  AST 33  ALT 32  ALKPHOS 66  BILITOT 1.1  PROT 7.6  ALBUMIN 4.2   No results for input(s): "LIPASE", "AMYLASE" in the last 168 hours. Recent Labs  Lab 12/24/21 1341  AMMONIA 26   Coagulation Profile: Recent Labs  Lab 12/24/21 1341  INR 1.5*   Cardiac Enzymes: No results for input(s): "CKTOTAL", "CKMB", "CKMBINDEX", "TROPONINI" in the last 168 hours. BNP (last 3 results) No results for input(s): "PROBNP" in the last 8760 hours. HbA1C: No results for input(s): "HGBA1C" in the last 72 hours. CBG: Recent Labs  Lab 12/24/21 1315  GLUCAP 100*   Lipid Profile: No results for input(s): "CHOL", "HDL", "LDLCALC", "TRIG", "CHOLHDL", "LDLDIRECT" in the last 72 hours. Thyroid Function Tests: No results for input(s): "TSH", "T4TOTAL", "FREET4", "T3FREE", "THYROIDAB" in the last 72 hours. Anemia Panel: No results for input(s): "VITAMINB12", "FOLATE", "FERRITIN", "TIBC", "IRON", "RETICCTPCT" in the last 72 hours. Urine analysis:    Component Value Date/Time   COLORURINE YELLOW 12/08/2021 0646   APPEARANCEUR HAZY (A) 12/08/2021 0646   LABSPEC 1.017 12/08/2021 0646   PHURINE 8.0 12/08/2021 0646   GLUCOSEU 50 (A) 12/08/2021 0646   GLUCOSEU NEG mg/dL 10/30/2009 1944   HGBUR SMALL (A) 12/08/2021 0646   BILIRUBINUR NEGATIVE 12/08/2021 0646   BILIRUBINUR negative 05/04/2018 0910   KETONESUR NEGATIVE 12/08/2021 2423  PROTEINUR 100 (A) 12/08/2021 0646   UROBILINOGEN 0.2 05/04/2018 0910    UROBILINOGEN 1 02/14/2014 0946   NITRITE NEGATIVE 12/08/2021 0646   LEUKOCYTESUR LARGE (A) 12/08/2021 0646    Radiological Exams on Admission: CT Head Wo Contrast  Result Date: 12/24/2021 CLINICAL DATA:  Mental status change, dialysis patient EXAM: CT HEAD WITHOUT CONTRAST TECHNIQUE: Contiguous axial images were obtained from the base of the skull through the vertex without intravenous contrast. RADIATION DOSE REDUCTION: This exam was performed according to the departmental dose-optimization program which includes automated exposure control, adjustment of the mA and/or kV according to patient size and/or use of iterative reconstruction technique. COMPARISON:  08/23/2021 FINDINGS: Brain: Stable atrophy pattern and extensive white matter microvascular ischemic changes throughout both cerebral hemispheres. No acute intracranial hemorrhage, mass lesion, midline shift, herniation, or hydrocephalus. No focal mass effect or edema. Cisterns are patent. No cerebellar abnormality. Vascular: No hyperdense vessel or unexpected calcification. Skull: Normal. Negative for fracture or focal lesion. Sinuses/Orbits: No acute finding. Other: None. IMPRESSION: Stable atrophy and white matter microvascular ischemic changes. No acute intracranial abnormality or interval change by noncontrast CT. Electronically Signed   By: Jerilynn Mages.  Shick M.D.   On: 12/24/2021 14:41   DG Chest Port 1 View  Result Date: 12/24/2021 CLINICAL DATA:  Altered mental status EXAM: PORTABLE CHEST 1 VIEW COMPARISON:  12/07/2021 FINDINGS: Transverse diameter of the heart is increased. There is interval decrease in pulmonary vascular congestion and clearing of pulmonary edema. There are no new focal infiltrates. There is no significant pleural effusion or pneumothorax. Metallic sutures are seen in the sternum. Tip of dialysis catheter is seen in the region of right atrium. IMPRESSION: Cardiomegaly. There are no signs of pulmonary edema or focal pulmonary  consolidation. Electronically Signed   By: Elmer Picker M.D.   On: 12/24/2021 14:00    EKG: Independently reviewed.  Sinus, no acute ST changes.  Assessment/Plan Principal Problem:   Acute metabolic encephalopathy Active Problems:   Hypertensive emergency   Malignant HTN with heart disease, w/o CHF, 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  (please populate well all problems here in Problem List. (For example, if patient is on BP meds at home and you resume or decide to hold them, it is a problem that needs to be her. Same for CAD, COPD, HLD and so on)  Hypertension encephalopathy -Secondary to noncompliance with hemodialysis and possible noncompliant with home BP meds. -Resume home BP meds when possible, including Imdur, hydralazine, Coreg, amlodipine and clonidine. -As needed hydralazine -Emergency HD  Malignant HTN -With signs of endorgan damage of acute encephalopathy, management as above. -Aiming at decrease of SBP by 25% tonight.  Hyperkalemia -No significant T wave changes on EKG, will hold off losartan.  1 dose of Lokelma given.  Emergency HD as per nephrology.  PAF -Given the significant increase of blood pressure and risk of bleeding, will hold off Eliquis for tonight, expect to resume Eliquis in 24 hours once BP more controlled.  ESRD on HD -Emergency HD as per nephrology  Chronic HFrEF -No significant signs of fluid overload -BP control, emergency HD.  DVT prophylaxis: Heparin subcu Code Status: Full code Family Communication: Tried to reach patient family x3 Disposition Plan: Patient sick with acute encephalopathy, malignant hypertension and ESRD missing dialysis, expect more than 2 midnight hospital stay. Consults called: Nephrology, PCCM Admission status: PCU   Lequita Halt MD [facility] Pager [phone]  12/24/2021, 6:02 PM

## 2021-12-24 NOTE — Consult Note (Signed)
Renal Service Consult Note [facility]  KAMALA KOLTON 12/24/2021 Sol Blazing, MD Requesting Physician: Dr. Charlsie Quest   Reason for Consult: ESRD pt w/ AMS and uncont HTN HPI: The patient is a 71 y.o. year-old w/ hx of atrial fib, sp aortic dissection repair, prior hx breast cancer, COPD, ESRD on HD, HTN, anemia who presented to ED by EMS from home for AMS x 24 hrs. Missed HD yesterday, last HD Sat. Pt takes 4 BP meds at home. In ED pts BP was high at 200/ 110. Pt is normally alert and drives to dialysis. IV Cleviprex started w/ good response, pt to be admitted to ICU. Asked to see for ESRD.    Pt seen in ED, she is confused and not making sense when answering questions. Ox 1.  No hx obtained.   ROS  n/a  Past Medical History  Past Medical History:  Diagnosis Date   AF (paroxysmal atrial fibrillation) ([place]) 05/29/2019   on Coumadin   Aortic atherosclerosis ([place]) 07/05/2019   Aortic dissection ([place]) 04/04/2019   s/p repair   Bone spur 2008   Right calcaneal foot spur   Breast cancer ([place]) 2004   Ductal carcinoma in situ of the left breast; S/P left partial mastectomy 02/26/2003; S/P re-excision of cranial and lateral margins11/18/2004.radiation   Cerebral thrombosis with cerebral infarction 05/22/2019   Chronic low back pain 06/22/2016   Chronic obstructive lung disease ([place]) 01/16/2017   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 Autumn Messing; S/P re-excision of superior and inferior margins 10/27/2012.    ESRD on hemodialysis ([place]) 05/29/2019   Essential hypertension 09/16/2006   GERD 09/16/2006   Hepatitis C    treated and RNA confirmed not detectable 01/2017   Insomnia 03/14/2015   Malnutrition of moderate degree 05/19/2019   Non compliance with medical treatment 12/04/2017   Normocytic anemia    With thrombocytosis   Osteoarthritis    Right ureteral stone 2002   S/P lumbar spinal fusion  01/18/2014   S/P lumbar decompressive laminectomy, fusion, and plating for lumbar spinal stenosis on 05/27/2009 by Dr. Eustace Moore.  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 Eustace Moore.     Tobacco use disorder 04/19/2009   Uterine fibroid    Wears dentures    top   Past Surgical History  Past Surgical History:  Procedure Laterality Date   ANTERIOR LAT LUMBAR FUSION N/A 01/18/2014   Procedure: ANTERIOR LATERAL LUMBAR FUSION LUMBAR TWO-THREE;  Surgeon: Eustace Moore, MD;  Location: [place] [facility];  Service: Neurosurgery;  Laterality: N/A;   ANTERIOR LUMBAR FUSION  01/18/2014   AV FISTULA PLACEMENT Left 04/20/2019   Procedure: ARTERIOVENOUS (AV) FISTULA CREATION;  Surgeon: [name], MD;  Location: [place];  Service: Vascular;  Laterality: Left;   BACK SURGERY     BREAST LUMPECTOMY Left 01/2003   BREAST LUMPECTOMY Right 2014   BREAST LUMPECTOMY WITH NEEDLE LOCALIZATION AND AXILLARY SENTINEL LYMPH NODE BX Right 10/13/2012   Procedure: BREAST LUMPECTOMY WITH NEEDLE LOCALIZATION;  Surgeon: [name], MD;  Location: [place];  Service: General;  Laterality: Right;  Right breast wire localized lumpectomy   INSERTION OF DIALYSIS CATHETER Right 04/20/2019   Procedure: INSERTION OF DIALYSIS CATHETER, right  internal jugular;  Surgeon: [name], MD;  Location: [place];  Service: Vascular;  Laterality: Right;   INSERTION OF DIALYSIS CATHETER Right 09/24/2020   Procedure: INSERTION OF TUNNELED DIALYSIS CATHETER;  Surgeon: Rosetta Posner, MD;  Location: [place];  Service: Vascular;  Laterality: Right;   IR FLUORO GUIDE CV LINE RIGHT  09/22/2020   IR THORACENTESIS ASP PLEURAL SPACE W/IMG GUIDE  05/19/2019   IR US GUIDE VASC ACCESS LEFT  09/22/2020   IR US GUIDE VASC  ACCESS RIGHT  09/22/2020   IR VENOCAVAGRAM SVC  09/22/2020   LAMINECTOMY  05/27/2009   Lumbar decompressive laminectomy, fusion and plating for lumbar spinal stensosis   LIGATION OF ARTERIOVENOUS  FISTULA Left 09/24/2020   Procedure: LIGATION OF LEFT ARM ARTERIOVENOUS  FISTULA;  Surgeon: Rosetta Posner, MD;  Location: [place];  Service: Vascular;  Laterality: Left;   LUMBAR LAMINECTOMY/DECOMPRESSION MICRODISCECTOMY Left 03/23/2013   Procedure: LUMBAR LAMINECTOMY/DECOMPRESSION MICRODISCECTOMY 1 LEVEL;  Surgeon: Eustace Moore, MD;  Location: [place] [facility];  Service: Neurosurgery;  Laterality: Left;  LUMBAR LAMINECTOMY/DECOMPRESSION MICRODISCECTOMY 1 LEVEL   MASTECTOMY, PARTIAL Left 02/26/2003   ; S/P re-excision of cranial and lateral margins 04/19/2003.    RE-EXCISION OF BREAST CANCER,SUPERIOR MARGINS Right 10/27/2012   Procedure: RE-EXCISION OF BREAST CANCER,SUPERIOR and inferior MARGINS;  Surgeon: [name], MD;  Location: [place];  Service: General;  Laterality: Right;   RE-EXCISION OF BREAST LUMPECTOMY Left 04/2003   TEE WITHOUT CARDIOVERSION N/A 04/04/2019   Procedure: Transesophageal Echocardiogram (Tee);  Surgeon: Wonda Olds, MD;  Location: [place];  Service: Open Heart Surgery;  Laterality: N/A;   THORACIC AORTIC ANEURYSM REPAIR N/A 04/04/2019   Procedure: THORACIC ASCENDING ANEURYSM REPAIR (AAA)  USING 28 MM X 30 CM HEMASHIELD PLATINUM VASCULAR GRAFT;  Surgeon: Wonda Olds, MD;  Location: [facility];  Service: Open Heart Surgery;  Laterality: N/A;   Family History  Family History  Problem Relation Age of Onset   Colon cancer Mother 68   Hypertension Mother    Diabetes Sister 40   Hypertension Sister    Diabetes Brother    Hypertension Brother    Diabetes Brother    Hypertension Brother    Kidney disease Son        s/p renal transplant   Hypertension Son    Diabetes Son    Multiple sclerosis Son    Bone cancer Sister 73   Breast cancer Neg Hx    Cervical cancer Neg Hx     Social History  reports that she quit smoking about 2 years ago. Her smoking use included cigarettes. She has a 11.00 pack-year smoking history. She has never used smokeless tobacco. She reports that she does not currently use alcohol after a past usage of about 2.0 standard drinks of alcohol per week. She reports that she does not currently use drugs after having used the following drugs: Cocaine. Allergies  Allergies  Allergen Reactions   Shrimp [Shellfish Allergy] Shortness Of Breath   Bactroban [Mupirocin] Other (See Comments)    "Sores in nose"   Vicodin [Hydrocodone-Acetaminophen] Itching and Nausea And Vomiting    This is patient's home medication   Eliquis [Apixaban] Itching    Spoke with patient, no rash.  Has tolerated on re-challenge.   Lisinopril Cough   Tylenol [Acetaminophen] Itching   Home medications Prior to Admission medications   Medication Sig Start Date End Date Taking? Authorizing Provider  albuterol (VENTOLIN HFA) 108 (90 Base)  MCG/ACT inhaler Inhale 2 puffs into the lungs every 6 (six) hours as needed for wheezing or shortness of breath. Patient not taking: Reported on 10/29/2021 07/30/20   Maximiano Coss, NP  amLODipine (NORVASC) 10 MG tablet Take 1 tablet (10 mg total) by mouth daily. 02/14/21   Maximiano Coss, NP  apixaban (ELIQUIS) 2.5 MG TABS tablet Take 1 tablet (2.5 mg total) by mouth 2 (two) times daily. 08/24/21   Shary Key, DO  carvedilol (COREG) 6.25 MG tablet Take 1 tablet (6.25 mg total) by mouth 2 (two) times daily with a meal. 12/08/21   Wouk, Ailene Rud, MD  cetirizine (ZYRTEC) 10 MG tablet Take 10 mg by mouth daily as needed for allergies.    [provider]  cloNIDine (CATAPRES - DOSED IN MG/24 HR) 0.1 mg/24hr patch Place 1 patch (0.1 mg total) onto the skin every Saturday. Patient not taking: Reported on 10/29/2021 06/14/21   Caren Griffins, MD  hydrALAZINE (APRESOLINE) 25 MG tablet Take 25 mg by mouth 2 (two) times daily. 11/04/21    [provider]  hydrALAZINE (APRESOLINE) 50 MG tablet Take 1 tablet (50 mg total) by mouth 3 (three) times daily. 06/21/21 10/29/21  Nolberto Hanlon, MD  isosorbide mononitrate (IMDUR) 30 MG 24 hr tablet Take 1 tablet (30 mg total) by mouth daily. 08/17/21   Shary Key, DO  loperamide (IMODIUM) 2 MG capsule Take 2 mg by mouth as needed for diarrhea or loose stools. 07/15/20   [provider]  losartan (COZAAR) 100 MG tablet Take 1 tablet (100 mg total) by mouth daily. 02/14/21   Maximiano Coss, NP  mirtazapine (REMERON) 7.5 MG tablet Take 1 tablet (7.5 mg total) by mouth at bedtime. Patient taking differently: Take 7.5 mg by mouth at bedtime as needed (sleep). 07/15/20   Maximiano Coss, NP  [facility] 4 MG/0.1ML LIQD nasal spray kit Place 1 spray into the nose as needed for opioid reversal. If no response within 3 minutes then repeat once in the other nostril.  Call 911. 05/15/20   [provider]  pregabalin (LYRICA) 50 MG capsule Take 100 mg by mouth at bedtime. 05/07/21   [provider]  rosuvastatin (CRESTOR) 10 MG tablet Take 1 tablet (10 mg total) by mouth daily. Patient not taking: Reported on 10/29/2021 02/14/21 09/15/21  Maximiano Coss, NP  sevelamer carbonate (RENVELA) 2.4 g PACK Take 2.4 g by mouth 3 (three) times daily with meals. Patient not taking: Reported on 10/29/2021 05/15/21   [provider]  sucroferric oxyhydroxide (VELPHORO) 500 MG chewable tablet Chew 1,000 mg by mouth 3 (three) times daily with meals.    [provider]  torsemide (DEMADEX) 20 MG tablet Take 40 mg by mouth daily.    [provider]  VITAMIN D PO Take 5,000 Units by mouth daily.    [provider]     Vitals:   12/24/21 1515 12/24/21 1527 12/24/21 1530 12/24/21 1545  BP: (!) 152/80  (!) 164/83 (!) 166/105  Pulse: 73 73 73 78  Resp: (!) 31 14 15  (!) 26  Temp:      TempSrc:      SpO2: 94% 96% 96% 96%   Exam Gen elderly AAF,  confused, no distress No rash, cyanosis or gangrene Sclera anicteric, throat clear  No jvd or bruits Chest clear bilat, dec'd bilat bases, no rales/ wheezing RRR no RG Abd soft ntnd no mass or ascites +bs GU defer MS no joint effusions or deformity Ext  no LE or UE edema, poor skin turgor, no wounds or ulcers Neuro is alert, nonfocal, Ox 1, confused    RIJ TDC intact   Home meds include - amlodipine 10, apixaban, carvedilol 6.25 bid, clonidine patch 0.1 weekly, hydralazine 25- 57m 2-3x per day, isosorbide mononitrate 30, losartan 100 qd, mirtazapine, pregabalin 100 hs, rosuvastatin, sevelamer carbonate 3 ac tid, sucroferric oxyhydroxide 1 gm ac tid, vits/ supps/ prns     OP HD: AF TTS  3h 479m   400/1.5  44kg  2/2 bath P2  Hep none  RIJ TDC - last HD 7/22, post wt 45kg, usual UFG 1-2kg - last Hb 10.5 on 7/20 - mircera 50 mcg q2, last 7/18, due early Aug - iron sucrose 100 iv q hd thru 8/17 - doxercalciferol 2 ug iv tiw  Assessment/ Plan: AMS - in setting of uncont HTN. Workup in progress. To be admitted to ICU.  HTN / vol - w/ HTN urgency, started on Cleviprex in ED and appears to be responding. Does not look vol overloaded on exam. Home meds have been resumed.   ESRD - on HD TTS. Missed HD yesterday, last HD was last Sat. K+ up slightly. Will plan on HD tonight or at the latest in am tomorrow.  Hyperkalemia - will order Lokelma po. HD as above.  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 Will hold IV vdra for now. Cont binders, add on phos.       RoKelly SplinterMD 12/24/2021, 3:59 PM Recent Labs  Lab 12/24/21 1341 12/24/21 1353  HGB 11.4* 12.9  ALBUMIN 4.2  --   CALCIUM 10.1  --   CREATININE 13.37* 14.70*  K 5.9* 5.6*

## 2021-12-24 NOTE — ED Notes (Signed)
Dr. Sabra Heck notified of most recent BP and that cleviprex drip stopped

## 2021-12-24 NOTE — Consult Note (Addendum)
NAME:  Monique Henderson, MRN:  [id number], DOB:  Jul 10, 1950, LOS: 0 ADMISSION DATE:  12/24/2021 CONSULTATION DATE:  12/24/2021 REFERRING MD:  Sabra Heck - EDP, CHIEF COMPLAINT:  Hypertensive emergency  History of Present Illness:  71 year old woman who presented to [facility] ED 7/26 via EMS for AMS, hypertension. Reportedly nonverbal with EMS, but speaking on ED arrival. Missed HD session yesterday, 7/25. Last full HD session 7/22. PMHx significant for HTN, AF (on Eliquis), AAA with dissection s/p repair, COPD/tobacco use, ESRD (on HD TTS via RIJ Permcath), DCIS (s/p lumpectomy 2014), Hepatitis C (SVR 01/2017), GERD, chronic LBP (s/p lumbar fusions 2010, 2015).  Patient presented to [facility] ED 7/26 via EMS.  Per family, patient was nonverbal and altered from baseline (normally ambulatory, also incontinent of stool today). She reportedly missed her dialysis session yesterday 7/25.  On ED arrival, patient was verbalizing but did not appear completely oriented and was unable to follow commands.  She was severely hypertensive with BP 200/110 for which Cleviprex drip was initiated.  In ED, home medications were also administered.  CXR demonstrated cardiomegaly, no significant pulmonary edema/consolidation.  CT head was completed with stable atrophy/microvascular ischemic changes, NAICA.  Labs were notable for mild anemia (baseline), mild hyponatremia with Na 149, K 5.9, BUN 93, Cr 13.37.  LFTs were normal.  INR 1.5, likely in the setting of apixaban.  Ammonia within normal limits.  Ethanol negative, UA/UDS pending.  Respiratory virus panel negative.  Nephrology was consulted for dialysis in the setting of ESRD/missed HD session.  PCCM was consulted for possible ICU admission in the setting of poor mental status and Cleviprex drip.  Pertinent Medical History:   Past Medical History:  Diagnosis Date   AF (paroxysmal atrial fibrillation) ([place]) 05/29/2019   on Coumadin   Aortic atherosclerosis ([place]) 07/05/2019   Aortic  dissection ([place]) 04/04/2019   s/p repair   Bone spur 2008   Right calcaneal foot spur   Breast cancer ([place]) 2004   Ductal carcinoma in situ of the left breast; S/P left partial mastectomy 02/26/2003; S/P re-excision of cranial and lateral margins11/18/2004.radiation   Cerebral thrombosis with cerebral infarction 05/22/2019   Chronic low back pain 06/22/2016   Chronic obstructive lung disease ([place]) 01/16/2017   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 Autumn Messing; S/P re-excision of superior and inferior margins 10/27/2012.    ESRD on hemodialysis ([place]) 05/29/2019   Essential hypertension 09/16/2006   GERD 09/16/2006   Hepatitis C    treated and RNA confirmed not detectable 01/2017   Insomnia 03/14/2015   Malnutrition of moderate degree 05/19/2019   Non compliance with medical treatment 12/04/2017   Normocytic anemia    With thrombocytosis   Osteoarthritis    Right ureteral stone 2002   S/P lumbar spinal fusion 01/18/2014   S/P lumbar decompressive laminectomy, fusion, and plating for lumbar spinal stenosis on 05/27/2009 by Dr. Eustace Moore.  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 Eustace Moore.     Tobacco use disorder 04/19/2009   Uterine fibroid    Wears dentures    top   Significant Hospital Events: Including procedures, antibiotic start and stop dates in addition to other pertinent events   7/26 - Presented to [facility] ED via EMS for AMS, hypertension. BP 200s/110s.  Cleviprex started, home meds resumed. PCCM consulted.  Interim History / Subjective:  As above  Objective:  Blood pressure (!) 180/128, pulse 71, temperature 97.7 F (36.5 C), temperature source Rectal, resp. rate (!) 25, SpO2 99 %.       No intake or output data  in the 24 hours ending 12/24/21 1522 There were no vitals filed for this visit.  Physical Examination: General: Chronically ill-appearing elderly woman in NAD. Laying in bed. Cachectic. HEENT: [place]/AT, anicteric sclera, PERRL, dry mucous membranes. Neuro: Awake, oriented x 3. Responds to verbal stimuli. Following commands consistently. Moves all 4 extremities spontaneously. Strength 4/5 in all 4 extremities. CV: RRR, no m/g/r. PULM: Breathing even and unlabored on RA. Lung fields CTAB. GI: Soft, mild generalized TTP, nondistended. Normoactive bowel sounds. Extremities: No LE edema noted. Skin: Warm/very dry, no rashes.  Resolved Hospital Problem List:    Assessment & Plan:  Monique Henderson is seen in consultation at the request of Dr. Sabra Heck ([place]) for further evaluation and management of hypertensive emergency.  At the time of PCCM evaluation, patient's mentation has significantly improved and she is alert and oriented x 3 (self, date, location).  She is adequately verbalizing and readily answers questions.  She denies missing dialysis, so there may be some degree of confusion that remains, but this is significantly improved from her initial presentation.  From the blood pressure standpoint, patient on exam is on 1 mg/h of Cleviprex and well within normal range for blood pressure.  With the resumption of home medications, should be able to discontinue Cleviprex.  Patient is likely stable for hospitalist admission to the floor and will benefit from dialysis today to help normalize labs and volume status.  Nephrology has already been consulted.  Hypertensive emergency - Stable for admission to floor, does not require ICU admission - BP normalized off of Cleviprex gtt - Continue home medications - Cardiac monitoring - Strict I&Os - Volume status optimization with HD  ESRD on HD, noncompliant - Nephrology consulted, following - HD per Nephro - Trend BMP - Replete electrolytes as  indicated  PCCM will sign off. Thank you for involving [place] in this patient's care. Please do not hesitate to reach out if PCCM can be of further assistance.  Best Practice: (right click and "Reselect all SmartList Selections" daily)   Per Primary/Admitting Team  Labs:  CBC: Recent Labs  Lab 12/24/21 1341 12/24/21 1353  WBC 4.8  --   NEUTROABS 4.2  --   HGB 11.4* 12.9  HCT 36.5 38.0  MCV 94.6  --   PLT 244  --     Basic Metabolic Panel: Recent Labs  Lab 12/24/21 1341 12/24/21 1353  NA 149* 145  K 5.9* 5.6*  CL 104 108  CO2 21*  --   GLUCOSE 104* 105*  BUN 93* 89*  CREATININE 13.37* 14.70*  CALCIUM 10.1  --    GFR: CrCl cannot be calculated (Unknown ideal weight.). Recent Labs  Lab 12/24/21 1341  WBC 4.8    Liver Function Tests: Recent Labs  Lab 12/24/21 1341  AST 33  ALT 32  ALKPHOS 66  BILITOT 1.1  PROT 7.6  ALBUMIN 4.2   No results for input(s): "LIPASE", "AMYLASE" in the last 168 hours. Recent Labs  Lab 12/24/21 1341  AMMONIA 26   ABG:    Component Value Date/Time   PHART 7.364 06/07/2021 1440   PCO2ART 46.9 06/07/2021 1440   PO2ART 57.5 (L) 06/07/2021 1440   HCO3 25.4 06/07/2021  1440   TCO2 22 12/24/2021 1353   ACIDBASEDEF 8.0 (H) 04/06/2019 1748   O2SAT 82.7 06/07/2021 1440   Coagulation Profile: Recent Labs  Lab 12/24/21 1341  INR 1.5*   Cardiac Enzymes: No results for input(s): "CKTOTAL", "CKMB", "CKMBINDEX", "TROPONINI" in the last 168 hours.  HbA1C: Hgb A1c MFr Bld  Date/Time Value Ref Range Status  06/20/2021 06:08 PM 5.1 4.8 - 5.6 % Final    Comment:    (NOTE)         Prediabetes: 5.7 - 6.4         Diabetes: >6.4         Glycemic control for adults with diabetes: <7.0   02/14/2021 08:40 AM 5.9 4.6 - 6.5 % Final    Comment:    Glycemic Control Guidelines for People with Diabetes:Non Diabetic:  <6%Goal of Therapy: <7%Additional Action Suggested:  >8%    CBG: Recent Labs  Lab 12/24/21 1315  GLUCAP 100*   Review  of Systems:   Review of systems completed with pertinent positives/negatives outlined in above HPI.  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   Surgical History:   Past Surgical History:  Procedure Laterality Date   ANTERIOR LAT LUMBAR FUSION N/A 01/18/2014   Procedure: ANTERIOR LATERAL LUMBAR FUSION LUMBAR TWO-THREE;  Surgeon: Eustace Moore, MD;  Location: [place] [facility];  Service: Neurosurgery;  Laterality: N/A;   ANTERIOR LUMBAR FUSION  01/18/2014   AV FISTULA PLACEMENT Left 04/20/2019   Procedure: ARTERIOVENOUS (AV) FISTULA CREATION;  Surgeon: [name], MD;  Location: [place];  Service: Vascular;  Laterality: Left;   BACK SURGERY     BREAST LUMPECTOMY Left 01/2003   BREAST LUMPECTOMY Right 2014   BREAST LUMPECTOMY WITH NEEDLE LOCALIZATION AND AXILLARY SENTINEL LYMPH NODE BX Right 10/13/2012   Procedure: BREAST LUMPECTOMY WITH NEEDLE LOCALIZATION;  Surgeon: [name], MD;  Location: [place];  Service: General;  Laterality: Right;  Right breast wire localized lumpectomy   INSERTION OF DIALYSIS CATHETER Right 04/20/2019   Procedure: INSERTION OF DIALYSIS  CATHETER, right internal jugular;  Surgeon: [name], MD;  Location: [place];  Service: Vascular;  Laterality: Right;   INSERTION OF DIALYSIS CATHETER Right 09/24/2020   Procedure: INSERTION OF TUNNELED DIALYSIS CATHETER;  Surgeon: Rosetta Posner, MD;  Location: [facility];  Service: Vascular;  Laterality: Right;   IR FLUORO GUIDE CV LINE RIGHT  09/22/2020   IR THORACENTESIS ASP PLEURAL SPACE W/IMG GUIDE  05/19/2019   IR US GUIDE VASC ACCESS LEFT  09/22/2020   IR US GUIDE VASC ACCESS RIGHT  09/22/2020   IR VENOCAVAGRAM SVC  09/22/2020   LAMINECTOMY  05/27/2009   Lumbar decompressive laminectomy, fusion and plating for lumbar spinal stensosis   LIGATION OF ARTERIOVENOUS  FISTULA Left 09/24/2020   Procedure: LIGATION OF LEFT ARM ARTERIOVENOUS  FISTULA;  Surgeon: Rosetta Posner, MD;  Location: [facility]  OR;  Service: Vascular;  Laterality: Left;   LUMBAR LAMINECTOMY/DECOMPRESSION MICRODISCECTOMY Left 03/23/2013   Procedure: LUMBAR LAMINECTOMY/DECOMPRESSION MICRODISCECTOMY 1 LEVEL;  Surgeon: Eustace Moore, MD;  Location: [place] [facility];  Service: Neurosurgery;  Laterality: Left;  LUMBAR LAMINECTOMY/DECOMPRESSION MICRODISCECTOMY 1 LEVEL   MASTECTOMY, PARTIAL Left 02/26/2003   ; S/P re-excision of cranial and lateral margins 04/19/2003.    RE-EXCISION OF BREAST CANCER,SUPERIOR MARGINS Right 10/27/2012   Procedure: RE-EXCISION OF BREAST CANCER,SUPERIOR and inferior MARGINS;  Surgeon: [name], MD;  Location: [place];  Service: General;  Laterality: Right;   RE-EXCISION OF BREAST LUMPECTOMY Left 04/2003   TEE WITHOUT CARDIOVERSION N/A 04/04/2019   Procedure: Transesophageal Echocardiogram (Tee);  Surgeon: Wonda Olds, MD;  Location: [place];  Service: Open Heart Surgery;  Laterality: N/A;   THORACIC AORTIC ANEURYSM REPAIR N/A 04/04/2019   Procedure: THORACIC ASCENDING ANEURYSM REPAIR (AAA)  USING 28 MM X 30 CM HEMASHIELD PLATINUM VASCULAR GRAFT;  Surgeon: Wonda Olds, MD;  Location: [facility];   Service: Open Heart Surgery;  Laterality: N/A;   Social History:   reports that she quit smoking about 2 years ago. Her smoking use included cigarettes. She has a 11.00 pack-year smoking history. She has never used smokeless tobacco. She reports that she does not currently use alcohol after a past usage of about 2.0 standard drinks of alcohol per week. She reports that she does not currently use drugs after having used the following drugs: Cocaine.   Family History:  Her family history includes Bone cancer (age of onset: 6) in her sister; Colon cancer (age of onset: 64) in her mother; Diabetes in her brother, brother, and son; Diabetes (age of onset: 61) in her sister; Hypertension in her brother, brother, mother, sister, and son; Kidney disease in her son; Multiple sclerosis in her son. There is no history of Breast cancer or Cervical cancer.   Allergies: Allergies  Allergen Reactions   Shrimp [Shellfish Allergy] Shortness Of Breath   Bactroban [Mupirocin] Other (See Comments)    "Sores in nose"   Vicodin [Hydrocodone-Acetaminophen] Itching and Nausea And Vomiting    This is patient's home medication   Eliquis [Apixaban] Itching    Spoke with patient, no rash.  Has tolerated on re-challenge.   Lisinopril Cough   Tylenol [Acetaminophen] Itching    Home Medications: Prior to Admission medications   Medication Sig Start Date End Date Taking? Authorizing Provider  albuterol (VENTOLIN HFA) 108 (90 Base) MCG/ACT inhaler Inhale 2 puffs into the lungs every 6 (six) hours as needed for wheezing or shortness of breath. Patient not taking: Reported on 10/29/2021 07/30/20   Maximiano Coss, NP  amLODipine (NORVASC) 10 MG tablet Take 1 tablet (10 mg total) by mouth daily. 02/14/21   Maximiano Coss, NP  apixaban (ELIQUIS) 2.5 MG TABS tablet Take 1 tablet (2.5 mg total) by mouth 2 (two) times daily. 08/24/21   Shary Key, DO  carvedilol (COREG) 6.25 MG tablet Take 1 tablet (6.25 mg total) by mouth  2 (two) times daily with a meal. 12/08/21   Wouk, Ailene Rud, MD  cetirizine (ZYRTEC) 10 MG tablet Take 10 mg by mouth daily as needed for allergies.    [provider]  cloNIDine (CATAPRES - DOSED IN MG/24 HR) 0.1 mg/24hr patch Place 1 patch (0.1 mg total) onto the skin every Saturday. Patient not taking: Reported on 10/29/2021 06/14/21   Caren Griffins, MD  hydrALAZINE (APRESOLINE) 25 MG tablet Take 25 mg by  mouth 2 (two) times daily. 11/04/21   [provider]  hydrALAZINE (APRESOLINE) 50 MG tablet Take 1 tablet (50 mg total) by mouth 3 (three) times daily. 06/21/21 10/29/21  Nolberto Hanlon, MD  isosorbide mononitrate (IMDUR) 30 MG 24 hr tablet Take 1 tablet (30 mg total) by mouth daily. 08/17/21   Shary Key, DO  loperamide (IMODIUM) 2 MG capsule Take 2 mg by mouth as needed for diarrhea or loose stools. 07/15/20   [provider]  losartan (COZAAR) 100 MG tablet Take 1 tablet (100 mg total) by mouth daily. 02/14/21   Maximiano Coss, NP  mirtazapine (REMERON) 7.5 MG tablet Take 1 tablet (7.5 mg total) by mouth at bedtime. Patient taking differently: Take 7.5 mg by mouth at bedtime as needed (sleep). 07/15/20   Maximiano Coss, NP  [facility] 4 MG/0.1ML LIQD nasal spray kit Place 1 spray into the nose as needed for opioid reversal. If no response within 3 minutes then repeat once in the other nostril.  Call 911. 05/15/20   [provider]  pregabalin (LYRICA) 50 MG capsule Take 100 mg by mouth at bedtime. 05/07/21   [provider]  rosuvastatin (CRESTOR) 10 MG tablet Take 1 tablet (10 mg total) by mouth daily. Patient not taking: Reported on 10/29/2021 02/14/21 09/15/21  Maximiano Coss, NP  sevelamer carbonate (RENVELA) 2.4 g PACK Take 2.4 g by mouth 3 (three) times daily with meals. Patient not taking: Reported on 10/29/2021 05/15/21   [provider]  sucroferric oxyhydroxide (VELPHORO) 500 MG chewable tablet Chew 1,000 mg by mouth 3 (three) times  daily with meals.    [provider]  torsemide (DEMADEX) 20 MG tablet Take 40 mg by mouth daily.    [provider]  VITAMIN D PO Take 5,000 Units by mouth daily.    [provider]    Critical care time: N/A   Rhae Lerner [facility] 12/24/21 3:22 PM  Please see [email or web address] for pager details.  From 7A-7P if no response, please call [phone] After hours, please call ELink [phone]

## 2021-12-24 NOTE — ED Provider Notes (Signed)
[facility] EMERGENCY DEPARTMENT Provider Note   CSN: [id number] Arrival date & time: 12/24/21  1309     History  No chief complaint on file.   Monique Henderson is a 71 y.o. female.  HPI  This patient is a 71 year old female presenting to the hospital with altered mental status.  This patient is currently on Eliquis, amlodipine, clonidine, hydralazine, she also takes Cozaar, Lyrica, and is scheduled to take torsemide as well.  Evidently this patient has end-stage renal disease and is on dialysis, her dialysis catheter is in the right upper chest wall.  The report went out from paramedics that this patient was altered, when they arrived they found the patient to be nonverbal, she was mumbling, stated that she was alert and oriented x0 but awake and breathing.  The patient was noted to be severely hypertensive at nearly 200/110, she had been incontinent in stool while she was in her bed, the patient is normally ambulatory and drives herself to dialysis but did miss dialysis yesterday for an unknown reason.  The patient is not able to give any further information.  Paramedics found the blood sugar to be 112, IV started but no medications given prehospital.  Home Medications Prior to Admission medications   Medication Sig Start Date End Date Taking? Authorizing Provider  albuterol (VENTOLIN HFA) 108 (90 Base) MCG/ACT inhaler Inhale 2 puffs into the lungs every 6 (six) hours as needed for wheezing or shortness of breath. Patient not taking: Reported on 10/29/2021 07/30/20   Maximiano Coss, NP  amLODipine (NORVASC) 10 MG tablet Take 1 tablet (10 mg total) by mouth daily. 02/14/21   Maximiano Coss, NP  apixaban (ELIQUIS) 2.5 MG TABS tablet Take 1 tablet (2.5 mg total) by mouth 2 (two) times daily. 08/24/21   Shary Key, DO  carvedilol (COREG) 6.25 MG tablet Take 1 tablet (6.25 mg total) by mouth 2 (two) times daily with a meal. 12/08/21   Wouk, Ailene Rud, MD  cetirizine  (ZYRTEC) 10 MG tablet Take 10 mg by mouth daily as needed for allergies.    [provider]  cloNIDine (CATAPRES - DOSED IN MG/24 HR) 0.1 mg/24hr patch Place 1 patch (0.1 mg total) onto the skin every Saturday. Patient not taking: Reported on 10/29/2021 06/14/21   Caren Griffins, MD  hydrALAZINE (APRESOLINE) 25 MG tablet Take 25 mg by mouth 2 (two) times daily. 11/04/21   [provider]  hydrALAZINE (APRESOLINE) 50 MG tablet Take 1 tablet (50 mg total) by mouth 3 (three) times daily. 06/21/21 10/29/21  Nolberto Hanlon, MD  isosorbide mononitrate (IMDUR) 30 MG 24 hr tablet Take 1 tablet (30 mg total) by mouth daily. 08/17/21   Shary Key, DO  loperamide (IMODIUM) 2 MG capsule Take 2 mg by mouth as needed for diarrhea or loose stools. 07/15/20   [provider]  losartan (COZAAR) 100 MG tablet Take 1 tablet (100 mg total) by mouth daily. 02/14/21   Maximiano Coss, NP  mirtazapine (REMERON) 7.5 MG tablet Take 1 tablet (7.5 mg total) by mouth at bedtime. Patient taking differently: Take 7.5 mg by mouth at bedtime as needed (sleep). 07/15/20   Maximiano Coss, NP  [facility] 4 MG/0.1ML LIQD nasal spray kit Place 1 spray into the nose as needed for opioid reversal. If no response within 3 minutes then repeat once in the other nostril.  Call 911. 05/15/20   [provider]  pregabalin (LYRICA) 50 MG capsule Take 100 mg by  mouth at bedtime. 05/07/21   [provider]  rosuvastatin (CRESTOR) 10 MG tablet Take 1 tablet (10 mg total) by mouth daily. Patient not taking: Reported on 10/29/2021 02/14/21 09/15/21  Maximiano Coss, NP  sevelamer carbonate (RENVELA) 2.4 g PACK Take 2.4 g by mouth 3 (three) times daily with meals. Patient not taking: Reported on 10/29/2021 05/15/21   [provider]  sucroferric oxyhydroxide (VELPHORO) 500 MG chewable tablet Chew 1,000 mg by mouth 3 (three) times daily with meals.    [provider]  torsemide (DEMADEX) 20 MG  tablet Take 40 mg by mouth daily.    [provider]  VITAMIN D PO Take 5,000 Units by mouth daily.    [provider]      Allergies    Shrimp [shellfish allergy], Bactroban [mupirocin], Vicodin [hydrocodone-acetaminophen], Eliquis [apixaban], Lisinopril, and Tylenol [acetaminophen]    Review of Systems   Review of Systems  Unable to perform ROS: Mental status change    Physical Exam Updated Vital Signs BP (!) 180/128   Pulse 71   Temp 97.7 F (36.5 C) (Rectal)   Resp (!) 25   SpO2 99%  Physical Exam Vitals and nursing note reviewed.  Constitutional:      General: She is not in acute distress.    Appearance: She is well-developed.  HENT:     Head: Normocephalic and atraumatic.     Nose: No congestion or rhinorrhea.     Mouth/Throat:     Pharynx: No oropharyngeal exudate.     Comments: Tongue appears to be covered in thrush Eyes:     General: No scleral icterus.       Right eye: No discharge.        Left eye: No discharge.     Conjunctiva/sclera: Conjunctivae normal.     Pupils: Pupils are equal, round, and reactive to light.  Neck:     Thyroid: No thyromegaly.     Vascular: No JVD.     Comments: JVD present to the angle of the right mandible Cardiovascular:     Rate and Rhythm: Regular rhythm. Tachycardia present.     Heart sounds: Murmur heard.     No friction rub. No gallop.     Comments: Slightly tachycardic, systolic murmur present Pulmonary:     Effort: Pulmonary effort is normal. No respiratory distress.     Breath sounds: Normal breath sounds. No wheezing or rales.  Abdominal:     General: Bowel sounds are normal. There is no distension.     Palpations: Abdomen is soft. There is no mass.     Tenderness: There is no abdominal tenderness.  Musculoskeletal:        General: No tenderness. Normal range of motion.     Cervical back: Normal range of motion and neck supple.     Right lower leg: No edema.     Left lower leg: No edema.   Lymphadenopathy:     Cervical: No cervical adenopathy.  Skin:    General: Skin is warm and dry.     Findings: No erythema or rash.  Neurological:     Mental Status: She is alert.     Coordination: Coordination normal.     Comments: The patient is altered, she is not following commands very well, she gazes off without following finger, when I asked her to touch my finger she puts her finger in her mouth, she is able to mumble her name but not able to give me  any other information.  That being said she withdraws from pain in all 4 extremities and does not appear to have any focal weakness or facial droop  Psychiatric:        Behavior: Behavior normal.     ED Results / Procedures / Treatments   Labs (all labs ordered are listed, but only abnormal results are displayed) Labs Reviewed  PROTIME-INR - Abnormal; Notable for the following components:      Result Value   Prothrombin Time 17.9 (*)    INR 1.5 (*)    All other components within normal limits  CBC - Abnormal; Notable for the following components:   RBC 3.86 (*)    Hemoglobin 11.4 (*)    RDW 21.3 (*)    nRBC 2.7 (*)    All other components within normal limits  DIFFERENTIAL - Abnormal; Notable for the following components:   Lymphs Abs 0.3 (*)    nRBC 5 (*)    All other components within normal limits  COMPREHENSIVE METABOLIC PANEL - Abnormal; Notable for the following components:   Sodium 149 (*)    Potassium 5.9 (*)    CO2 21 (*)    Glucose, Bld 104 (*)    BUN 93 (*)    Creatinine, Ser 13.37 (*)    GFR, Estimated 3 (*)    Anion gap 24 (*)    All other components within normal limits  CBG MONITORING, ED - Abnormal; Notable for the following components:   Glucose-Capillary 100 (*)    All other components within normal limits  I-STAT CHEM 8, ED - Abnormal; Notable for the following components:   Potassium 5.6 (*)    BUN 89 (*)    Creatinine, Ser 14.70 (*)    Glucose, Bld 105 (*)    Calcium, Ion 1.08 (*)    All  other components within normal limits  RESP PANEL BY RT-PCR (FLU A&B, COVID) ARPGX2  ETHANOL  APTT  AMMONIA  RAPID URINE DRUG SCREEN, HOSP PERFORMED  URINALYSIS, ROUTINE W REFLEX MICROSCOPIC    EKG EKG Interpretation  Date/Time:  Wednesday December 24 2021 13:24:11 EDT Ventricular Rate:  90 PR Interval:  158 QRS Duration: 148 QT Interval:  437 QTC Calculation: 535 R Axis:   59 Text Interpretation: Sinus rhythm LAE, consider biatrial enlargement Right bundle branch block QT prolonged - similar to some prior Confirmed by Noemi Chapel [phone]) on 12/24/2021 1:26:53 PM  Radiology CT Head Wo Contrast  Result Date: 12/24/2021 CLINICAL DATA:  Mental status change, dialysis patient EXAM: CT HEAD WITHOUT CONTRAST TECHNIQUE: Contiguous axial images were obtained from the base of the skull through the vertex without intravenous contrast. RADIATION DOSE REDUCTION: This exam was performed according to the departmental dose-optimization program which includes automated exposure control, adjustment of the mA and/or kV according to patient size and/or use of iterative reconstruction technique. COMPARISON:  08/23/2021 FINDINGS: Brain: Stable atrophy pattern and extensive white matter microvascular ischemic changes throughout both cerebral hemispheres. No acute intracranial hemorrhage, mass lesion, midline shift, herniation, or hydrocephalus. No focal mass effect or edema. Cisterns are patent. No cerebellar abnormality. Vascular: No hyperdense vessel or unexpected calcification. Skull: Normal. Negative for fracture or focal lesion. Sinuses/Orbits: No acute finding. Other: None. IMPRESSION: Stable atrophy and white matter microvascular ischemic changes. No acute intracranial abnormality or interval change by noncontrast CT. Electronically Signed   By: Jerilynn Mages.  Shick M.D.   On: 12/24/2021 14:41   DG Chest Port 1 View  Result Date: 12/24/2021 CLINICAL  DATA:  Altered mental status EXAM: PORTABLE CHEST 1 VIEW COMPARISON:   12/07/2021 FINDINGS: Transverse diameter of the heart is increased. There is interval decrease in pulmonary vascular congestion and clearing of pulmonary edema. There are no new focal infiltrates. There is no significant pleural effusion or pneumothorax. Metallic sutures are seen in the sternum. Tip of dialysis catheter is seen in the region of right atrium. IMPRESSION: Cardiomegaly. There are no signs of pulmonary edema or focal pulmonary consolidation. Electronically Signed   By: Elmer Picker M.D.   On: 12/24/2021 14:00    Procedures .Critical Care  Performed by: Noemi Chapel, MD Authorized by: Noemi Chapel, MD   Critical care provider statement:    Critical care time (minutes):  30   Critical care time was exclusive of:  Separately billable procedures and treating other patients and teaching time   Critical care was necessary to treat or prevent imminent or life-threatening deterioration of the following conditions:  Renal failure and CNS failure or compromise   Critical care was time spent personally by me on the following activities:  Development of treatment plan with patient or surrogate, discussions with consultants, evaluation of patient's response to treatment, examination of patient, ordering and review of laboratory studies, ordering and review of radiographic studies, ordering and performing treatments and interventions, pulse oximetry, re-evaluation of patient's condition, review of old charts and obtaining history from patient or surrogate   I assumed direction of critical care for this patient from another provider in my specialty: no     Care discussed with: admitting provider   Comments:           Medications Ordered in ED Medications  clevidipine (CLEVIPREX) infusion 0.5 mg/mL (2 mg/hr Intravenous Restarted 12/24/21 1447)  labetalol (NORMODYNE) injection 10 mg (10 mg Intravenous Given 12/24/21 1327)    ED Course/ Medical Decision Making/ A&P                            Medical Decision Making Amount and/or Complexity of Data Reviewed Labs: ordered. Radiology: ordered.  Risk Prescription drug management.   This patient presents to the ED for concern of altered mental status, this involves an extensive number of treatment options, and is a complaint that carries with it a high risk of complications and morbidity.  The differential diagnosis includes acute stroke, hemorrhage, severe hypertension, press syndrome, acute infection, severe anemia, hyperkalemia, hyperammonemia   Co morbidities that complicate the patient evaluation  End-stage renal disease Severe hypertension   Additional history obtained:  Additional history obtained from electronic medical External records from outside source obtained and reviewed including multiple admissions to the hospital for the last year including respiratory failure end-stage renal disease, chest pain, hypertensive emergency, respiratory failure, hypertensive emergency and has been seen for hyperkalemia repeatedly   Lab Tests:  I Ordered, and personally interpreted labs.  The pertinent results include: Metabolic panel with a creatinine of 14.7 and a potassium of 5.6, alcohol undetectable, INR 1.5, blood counts are unremarkable, no leukocytosis, no anemia.  Ammonia is 26, negative for COVID and the flu, glucose is normal.   Imaging Studies ordered:  I ordered imaging studies including CT scan of the brain without contrast and a chest x-ray of the chest I independently visualized and interpreted imaging which showed no signs of acute intracranial abnormalities though that there is chronic white matter microvascular ischemic changes but they are stable over time.  The x-ray shows cardiomegaly but  no focal pulmonary edema or consolidation or pneumothorax. I agree with the radiologist interpretation   Cardiac Monitoring: / EKG:  The patient was maintained on a cardiac monitor.  I personally viewed and  interpreted the cardiac monitored which showed an underlying rhythm of: Normal sinus rhythm, EKG showing a right bundle branch block with QT prolongation   Consultations Obtained:  I requested consultation with the nephrologist Dr. Jonnie Finner,  and discussed lab and imaging findings as well as pertinent plan - they recommend: They will participate in the care of the patient with dialysis   Problem List / ED Course / Critical interventions / Medication management  The patient required being placed on a Cleviprex drip after labetalol, continue to be severely hypertensive last blood pressure 180/128 currently on Cleviprex and currently still confused and altered.  Maintaining her airway without difficulty, has good pulses, nontender abdomen, clear lung sounds. I discussed the care with the critical care intensivist team, they will come see the patient for admission I ordered medication including labetalol and Cleviprex for severe hypertension Reevaluation of the patient after these medicines showed that the patient critically ill but gradually improving I have reviewed the patients home medicines and have made adjustments as needed   Social Determinants of Health:  Dialysis patient, missing dialysis but not severely hyperkalemic, will need to be admitted to the hospital, will discuss with critical care   Test / Admission - Considered:  Admit to high level of care         Final Clinical Impression(s) / ED Diagnoses Final diagnoses:  Hypertensive encephalopathy  Hyperkalemia  ESRD (end stage renal disease) ([place])     Noemi Chapel, MD 12/24/21 1507

## 2021-12-24 NOTE — ED Provider Notes (Signed)
Pt d/w Dr. [name] (CCM) who said that pt's bp is now better after just an hour of Cleviprex.  Cleviprex is now off.  He recommends a floor admission as opposed to an ICU admission.  He recommends PRN Hydralazine for BP control.  Pt d/w Dr. Roosevelt Locks ([facility]) for admission.   Isla Pence, MD 12/24/21 [phone]

## 2021-12-24 NOTE — ED Triage Notes (Signed)
Patient arrived by [facility] from home after being found altered the past day. Patient nonverbal per EMS and does correct MD when he speaks with her. CBG 112 Last full dialysis Saturday and missed yesterday. Patient normally ambulatory but has remained  In bed today and incontinent of stool. Alert to person and birth month- Difficulty following commands

## 2021-12-24 NOTE — ED Notes (Signed)
ED TO INPATIENT HANDOFF REPORT  ED Nurse Name and Phone #: [phone]  S Name/Age/Gender Monique Henderson 70 y.o. female Room/Bed: TRAAC/TRAAC  Code Status   Code Status: Full Code  Home/SNF/Other Home Patient oriented to: self, place, and time Is this baseline? 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 Patient nonverbal per EMS and does correct MD when he speaks with her. CBG 112 Last full dialysis Saturday and missed yesterday. Patient normally ambulatory but has remained  In bed today and incontinent of stool. Alert to person and birth month- Difficulty following commands     Allergies Allergies  Allergen Reactions   Shrimp [Shellfish Allergy] Shortness Of Breath   Bactroban [Mupirocin] Other (See Comments)    "Sores in nose"   Vicodin [Hydrocodone-Acetaminophen] Itching and Nausea And Vomiting    This is patient's home medication   Eliquis [Apixaban] Itching    Spoke with patient, no rash.  Has tolerated on re-challenge.   Lisinopril Cough   Tylenol [Acetaminophen] Itching    Level of Care/Admitting Diagnosis ED Disposition     ED Disposition  Admit   Condition  --   [place]: [place] [[id number]]  Level of Care: Progressive [102]  Admit to Progressive based on following criteria: CARDIOVASCULAR & THORACIC of moderate stability with acute coronary syndrome symptoms/low risk myocardial infarction/hypertensive urgency/arrhythmias/heart failure potentially compromising stability and stable post cardiovascular intervention patients.  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 [[id number]]  Admitting Physician: Lequita Halt [[id number]]  Attending Physician: Lequita Halt [[id number]]  Certification:: I certify this patient will need inpatient services for at least 2 midnights          B Medical/Surgery History Past Medical History:  Diagnosis Date   AF (paroxysmal atrial fibrillation) ([place]) 05/29/2019   on Coumadin   Aortic atherosclerosis ([place]) 07/05/2019   Aortic dissection ([place]) 04/04/2019   s/p repair   Bone spur 2008   Right calcaneal foot spur   Breast cancer ([place]) 2004   Ductal carcinoma in situ of the left breast; S/P left partial mastectomy 02/26/2003; S/P re-excision of cranial and lateral margins11/18/2004.radiation   Cerebral thrombosis with cerebral infarction 05/22/2019   Chronic low back pain 06/22/2016   Chronic obstructive lung disease ([place]) 01/16/2017   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 Autumn Messing; S/P re-excision of superior and inferior margins 10/27/2012.    ESRD on hemodialysis ([place]) 05/29/2019   Essential hypertension 09/16/2006   GERD 09/16/2006   Hepatitis C    treated and RNA confirmed not detectable 01/2017   Insomnia 03/14/2015   Malnutrition of moderate degree 05/19/2019   Non compliance with medical treatment 12/04/2017   Normocytic anemia    With thrombocytosis   Osteoarthritis    Right ureteral stone 2002   S/P lumbar spinal fusion 01/18/2014   S/P lumbar decompressive laminectomy, fusion, and plating for lumbar spinal stenosis on 05/27/2009 by Dr. Eustace Moore.  S/P anterolateral retroperitoneal interbody fusion L2-3 utilizing a 8 mm peek  interbody cage packed with morcellized allograft, and anterior lumbar plating L2-3 for recurrent disc herniation L2-3 with spinal stenosis on 01/18/2014 by Dr. Eustace Moore.     Tobacco use disorder 04/19/2009   Uterine fibroid     Wears dentures    top   Past Surgical History:  Procedure Laterality Date   ANTERIOR LAT LUMBAR FUSION N/A 01/18/2014   Procedure: ANTERIOR LATERAL LUMBAR FUSION LUMBAR TWO-THREE;  Surgeon: Eustace Moore, MD;  Location: [facility];  Service: Neurosurgery;  Laterality: N/A;   ANTERIOR LUMBAR FUSION  01/18/2014   AV FISTULA PLACEMENT Left 04/20/2019   Procedure: ARTERIOVENOUS (AV) FISTULA CREATION;  Surgeon: [name], MD;  Location: [facility];  Service: Vascular;  Laterality: Left;   BACK SURGERY     BREAST LUMPECTOMY Left 01/2003   BREAST LUMPECTOMY Right 2014   BREAST LUMPECTOMY WITH NEEDLE LOCALIZATION AND AXILLARY SENTINEL LYMPH NODE BX Right 10/13/2012   Procedure: BREAST LUMPECTOMY WITH NEEDLE LOCALIZATION;  Surgeon: [name], MD;  Location: [facility];  Service: General;  Laterality: Right;  Right breast wire localized lumpectomy   INSERTION OF DIALYSIS CATHETER Right 04/20/2019   Procedure: INSERTION OF DIALYSIS CATHETER, right internal jugular;  Surgeon: [name], MD;  Location: [facility];  Service: Vascular;  Laterality: Right;   INSERTION OF DIALYSIS CATHETER Right 09/24/2020   Procedure: INSERTION OF TUNNELED DIALYSIS CATHETER;  Surgeon: Rosetta Posner, MD;  Location: [facility];  Service: Vascular;  Laterality: Right;   IR FLUORO GUIDE CV LINE RIGHT  09/22/2020   IR THORACENTESIS ASP PLEURAL SPACE W/IMG GUIDE  05/19/2019   IR US GUIDE VASC ACCESS LEFT  09/22/2020   IR US GUIDE VASC ACCESS RIGHT  09/22/2020   IR VENOCAVAGRAM SVC  09/22/2020   LAMINECTOMY  05/27/2009   Lumbar decompressive laminectomy, fusion and plating for lumbar spinal stensosis   LIGATION OF ARTERIOVENOUS  FISTULA Left 09/24/2020   Procedure: LIGATION OF LEFT ARM ARTERIOVENOUS  FISTULA;  Surgeon: Rosetta Posner, MD;  Location: [facility];  Service: Vascular;  Laterality: Left;   LUMBAR LAMINECTOMY/DECOMPRESSION MICRODISCECTOMY Left 03/23/2013   Procedure: LUMBAR  LAMINECTOMY/DECOMPRESSION MICRODISCECTOMY 1 LEVEL;  Surgeon: Eustace Moore, MD;  Location: [facility];  Service: Neurosurgery;  Laterality: Left;  LUMBAR LAMINECTOMY/DECOMPRESSION MICRODISCECTOMY 1 LEVEL   MASTECTOMY, PARTIAL Left 02/26/2003   ; S/P re-excision of cranial and lateral margins 04/19/2003.    RE-EXCISION OF BREAST CANCER,SUPERIOR MARGINS Right 10/27/2012   Procedure: RE-EXCISION OF BREAST CANCER,SUPERIOR and inferior MARGINS;  Surgeon: [name], MD;  Location: [place];  Service: General;  Laterality: Right;   RE-EXCISION OF BREAST LUMPECTOMY Left 04/2003   TEE WITHOUT CARDIOVERSION N/A 04/04/2019   Procedure: Transesophageal Echocardiogram (Tee);  Surgeon: Wonda Olds, MD;  Location: [facility];  Service: Open Heart Surgery;  Laterality: N/A;   THORACIC AORTIC ANEURYSM REPAIR N/A 04/04/2019   Procedure: THORACIC ASCENDING ANEURYSM REPAIR (AAA)  USING 28 MM X 30 CM HEMASHIELD PLATINUM VASCULAR GRAFT;  Surgeon: Wonda Olds, MD;  Location[facility];  Service: Open Heart Surgery;  Laterality: N/A;     A IV Location/Drains/Wounds Patient Lines/Drains/Airways Status     Active Line/Drains/Airways     Name Placement date Placement time Site Days   Peripheral IV 12/24/21 20 G Anterior;Left Forearm 12/24/21  1230  Forearm  less than 1   Peripheral IV 12/24/21 20 G Anterior;Right;Upper Arm 12/24/21  1330  Arm  less than 1  Hemodialysis Catheter Right Subclavian --  --  Subclavian  --   Hemodialysis Catheter Right Subclavian --  --  Subclavian  --   Incision (Closed) 09/24/20 Arm Left 09/24/20  1317  -- 456   Incision (Closed) 09/24/20 Chest Right 09/24/20  1317  -- 456            Intake/Output Last 24 hours No intake or output data in the 24 hours ending 12/24/21 1655  Labs/Imaging Results for orders placed or performed during the hospital encounter of 12/24/21 (from the past 48 hour(s))  CBG monitoring, ED     Status: Abnormal   Collection Time: 12/24/21  1:15 PM   Result Value Ref Range   Glucose-Capillary 100 (H) 70 - 99 mg/dL    Comment: Glucose reference range applies only to samples taken after fasting for at least 8 hours.  Resp Panel by RT-PCR (Flu A&B, Covid) Anterior Nasal Swab     Status: None   Collection Time: 12/24/21  1:34 PM   Specimen: Anterior Nasal Swab  Result Value Ref Range   SARS Coronavirus 2 by RT PCR NEGATIVE NEGATIVE    Comment: (NOTE) SARS-CoV-2 target nucleic acids are NOT DETECTED.  The SARS-CoV-2 RNA is generally detectable in upper respiratory specimens during the acute phase of infection. The lowest concentration of SARS-CoV-2 viral copies this assay can detect is 138 copies/mL. A negative result does not preclude SARS-Cov-2 infection and should not be used as the sole basis for treatment or other patient management decisions. A negative result may occur with  improper specimen collection/handling, submission of specimen other than nasopharyngeal swab, presence of viral mutation(s) within the areas targeted by this assay, and inadequate number of viral copies(<138 copies/mL). A negative result must be combined with clinical observations, patient history, and epidemiological information. The expected result is Negative.  Fact Sheet for Patients:  EntrepreneurPulse.com.au  Fact Sheet for Healthcare Providers:  IncredibleEmployment.be  This test is no t yet approved or cleared by the [place] FDA and  has been authorized for detection and/or diagnosis of SARS-CoV-2 by FDA under an Emergency Use Authorization (EUA). This EUA will remain  in effect (meaning this test can be used) for the duration of the COVID-19 declaration under Section 564(b)(1) of the Act, 21 U.S.C.section 360bbb-3(b)(1), unless the authorization is terminated  or revoked sooner.       Influenza A by PCR NEGATIVE NEGATIVE   Influenza B by PCR NEGATIVE NEGATIVE    Comment: (NOTE) The Xpert Xpress  SARS-CoV-2/FLU/RSV plus assay is intended as an aid in the diagnosis of influenza from Nasopharyngeal swab specimens and should not be used as a sole basis for treatment. Nasal washings and aspirates are unacceptable for Xpert Xpress SARS-CoV-2/FLU/RSV testing.  Fact Sheet for Patients: EntrepreneurPulse.com.au  Fact Sheet for Healthcare Providers: IncredibleEmployment.be  This test is not yet approved or cleared by the [place] FDA and has been authorized for detection and/or diagnosis of SARS-CoV-2 by FDA under an Emergency Use Authorization (EUA). This EUA will remain in effect (meaning this test can be used) for the duration of the COVID-19 declaration under Section 564(b)(1) of the Act, 21 U.S.C. section 360bbb-3(b)(1), unless the authorization is terminated or revoked.  Performed at [facility], [place] [address], [place], [place] [address]   Ethanol     Status: None   Collection Time: 12/24/21  1:41 PM  Result Value Ref Range   Alcohol, Ethyl (B) <10 <10 mg/dL    Comment: (NOTE)  Lowest detectable limit for serum alcohol is 10 mg/dL.  For medical purposes only. Performed at [facility], [place] [address] [place], [place] [address]   Protime-INR     Status: Abnormal   Collection Time: 12/24/21  1:41 PM  Result Value Ref Range   Prothrombin Time 17.9 (H) 11.4 - 15.2 seconds   INR 1.5 (H) 0.8 - 1.2    Comment: (NOTE) INR goal varies based on device and disease states. Performed at [facility], [place] [address] [place], [place] [address]   APTT     Status: None   Collection Time: 12/24/21  1:41 PM  Result Value Ref Range   aPTT 36 24 - 36 seconds    Comment: Performed at [facility] [address] [place], [place] [address]  CBC     Status: Abnormal   Collection Time: 12/24/21  1:41 PM  Result Value Ref Range   WBC 4.8 4.0 - 10.5 K/uL   RBC 3.86 (L) 3.87 - 5.11 MIL/uL   Hemoglobin 11.4 (L) 12.0 -  15.0 g/dL   HCT 36.5 36.0 - 46.0 %   MCV 94.6 80.0 - 100.0 fL   MCH 29.5 26.0 - 34.0 pg   MCHC 31.2 30.0 - 36.0 g/dL   RDW 21.3 (H) 11.5 - 15.5 %   Platelets 244 150 - 400 K/uL   nRBC 2.7 (H) 0.0 - 0.2 %    Comment: Performed at [facility] [address] [place], [place] [address]  Differential     Status: Abnormal   Collection Time: 12/24/21  1:41 PM  Result Value Ref Range   Neutrophils Relative % 88 %   Neutro Abs 4.2 1.7 - 7.7 K/uL   Lymphocytes Relative 7 %   Lymphs Abs 0.3 (L) 0.7 - 4.0 K/uL   Monocytes Relative 4 %   Monocytes Absolute 0.2 0.1 - 1.0 K/uL   Eosinophils Relative 0 %   Eosinophils Absolute 0.0 0.0 - 0.5 K/uL   Basophils Relative 1 %   Basophils Absolute 0.0 0.0 - 0.1 K/uL   nRBC 5 (H) 0 /100 WBC   Abs Immature Granulocytes 0.00 0.00 - 0.07 K/uL   Polychromasia PRESENT    Ovalocytes PRESENT     Comment: Performed at [facility] [address] [place], [place] [address]  Comprehensive metabolic panel     Status: Abnormal   Collection Time: 12/24/21  1:41 PM  Result Value Ref Range   Sodium 149 (H) 135 - 145 mmol/L   Potassium 5.9 (H) 3.5 - 5.1 mmol/L   Chloride 104 98 - 111 mmol/L   CO2 21 (L) 22 - 32 mmol/L   Glucose, Bld 104 (H) 70 - 99 mg/dL    Comment: Glucose reference range applies only to samples taken after fasting for at least 8 hours.   BUN 93 (H) 8 - 23 mg/dL   Creatinine, Ser 13.37 (H) 0.44 - 1.00 mg/dL   Calcium 10.1 8.9 - 10.3 mg/dL   Total Protein 7.6 6.5 - 8.1 g/dL   Albumin 4.2 3.5 - 5.0 g/dL   AST 33 15 - 41 U/L   ALT 32 0 - 44 U/L   Alkaline Phosphatase 66 38 - 126 U/L   Total Bilirubin 1.1 0.3 - 1.2 mg/dL   GFR, Estimated 3 (L) >60 mL/min    Comment: (NOTE) Calculated using the CKD-EPI Creatinine Equation (2021)    Anion gap 24 (H) 5 -  15    Comment: Electrolytes repeated to confirm. Performed at [facility], [place] [address] [place], [place] [address]   Ammonia     Status: None   Collection Time:  12/24/21  1:41 PM  Result Value Ref Range   Ammonia 26 9 - 35 umol/L    Comment: Performed at [facility], [place] [address], [place], [place] [address]  I-stat chem 8, ED     Status: Abnormal   Collection Time: 12/24/21  1:53 PM  Result Value Ref Range   Sodium 145 135 - 145 mmol/L   Potassium 5.6 (H) 3.5 - 5.1 mmol/L   Chloride 108 98 - 111 mmol/L   BUN 89 (H) 8 - 23 mg/dL   Creatinine, Ser 14.70 (H) 0.44 - 1.00 mg/dL   Glucose, Bld 105 (H) 70 - 99 mg/dL    Comment: Glucose reference range applies only to samples taken after fasting for at least 8 hours.   Calcium, Ion 1.08 (L) 1.15 - 1.40 mmol/L   TCO2 22 22 - 32 mmol/L   Hemoglobin 12.9 12.0 - 15.0 g/dL   HCT 38.0 36.0 - 46.0 %   CT Head Wo Contrast  Result Date: 12/24/2021 CLINICAL DATA:  Mental status change, dialysis patient EXAM: CT HEAD WITHOUT CONTRAST TECHNIQUE: Contiguous axial images were obtained from the base of the skull through the vertex without intravenous contrast. RADIATION DOSE REDUCTION: This exam was performed according to the departmental dose-optimization program which includes automated exposure control, adjustment of the mA and/or kV according to patient size and/or use of iterative reconstruction technique. COMPARISON:  08/23/2021 FINDINGS: Brain: Stable atrophy pattern and extensive white matter microvascular ischemic changes throughout both cerebral hemispheres. No acute intracranial hemorrhage, mass lesion, midline shift, herniation, or hydrocephalus. No focal mass effect or edema. Cisterns are patent. No cerebellar abnormality. Vascular: No hyperdense vessel or unexpected calcification. Skull: Normal. Negative for fracture or focal lesion. Sinuses/Orbits: No acute finding. Other: None. IMPRESSION: Stable atrophy and white matter microvascular ischemic changes. No acute intracranial abnormality or interval change by noncontrast CT. Electronically Signed   By: Jerilynn Mages.  Shick M.D.   On: 12/24/2021 14:41   DG Chest  Port 1 View  Result Date: 12/24/2021 CLINICAL DATA:  Altered mental status EXAM: PORTABLE CHEST 1 VIEW COMPARISON:  12/07/2021 FINDINGS: Transverse diameter of the heart is increased. There is interval decrease in pulmonary vascular congestion and clearing of pulmonary edema. There are no new focal infiltrates. There is no significant pleural effusion or pneumothorax. Metallic sutures are seen in the sternum. Tip of dialysis catheter is seen in the region of right atrium. IMPRESSION: Cardiomegaly. There are no signs of pulmonary edema or focal pulmonary consolidation. Electronically Signed   By: Elmer Picker M.D.   On: 12/24/2021 14:00    Pending Labs Unresulted Labs (From admission, onward)     Start     Ordered   12/25/21 1962  Basic metabolic panel  Daily at 5am,   R     Comments: As Scheduled for 5 days    12/24/21 1616   12/24/21 1621  Phosphorus  Add-on,   AD        12/24/21 1621   12/24/21 1320  Urine rapid drug screen (hosp performed)  Once,   STAT        12/24/21 1320   12/24/21 1320  Urinalysis, Routine w reflex microscopic  Once,   URGENT        12/24/21 1320  Vitals/Pain Today's Vitals   12/24/21 1600 12/24/21 1645 12/24/21 1652 12/24/21 1653  BP: (!) 171/109 (!) 182/129  (!) 171/86  Pulse: 77 77  75  Resp: 18 20  (!) 22  Temp:   97.9 F (36.6 C)   TempSrc:   Axillary   SpO2: 93% 91%  95%  PainSc:        Isolation Precautions No active isolations  Medications Medications  docusate sodium (COLACE) capsule 100 mg (has no administration in time range)  polyethylene glycol (MIRALAX / GLYCOLAX) packet 17 g (has no administration in time range)  heparin injection 5,000 Units (has no administration in time range)  hydrALAZINE (APRESOLINE) injection 5 mg (5 mg Intravenous Given 12/24/21 1649)  amLODipine (NORVASC) tablet 10 mg (has no administration in time range)  hydrALAZINE (APRESOLINE) tablet 25 mg (has no administration in time range)   isosorbide mononitrate (IMDUR) 24 hr tablet 30 mg (has no administration in time range)  losartan (COZAAR) tablet 50 mg (has no administration in time range)  sodium chloride flush (NS) 0.9 % injection 3 mL (has no administration in time range)  sodium chloride flush (NS) 0.9 % injection 3 mL (has no administration in time range)  0.9 %  sodium chloride infusion (has no administration in time range)  acetaminophen (TYLENOL) tablet 650 mg (has no administration in time range)  carvedilol (COREG) tablet 12.5 mg (has no administration in time range)  Chlorhexidine Gluconate Cloth 2 % PADS 6 each (has no administration in time range)  sodium zirconium cyclosilicate (LOKELMA) packet 10 g (has no administration in time range)  labetalol (NORMODYNE) injection 10 mg (10 mg Intravenous Given 12/24/21 1327)    Mobility walks with device High fall risk  R Recommendations: See Admitting Provider Note  Report given to:   Additional Notes:

## 2021-12-24 NOTE — ED Notes (Signed)
ED TO INPATIENT HANDOFF REPORT  ED Nurse Name and Phone #: [phone]  S Name/Age/Gender Monique Henderson 71 y.o. female Room/Bed: TRAAC/TRAAC  Code Status   Code Status: Full Code  Home/SNF/Other Home Patient oriented to: self and place Is this baseline? No   Triage Complete: Triage complete  Chief Complaint Acute metabolic encephalopathy [O03.55]  Triage Note Patient arrived by [facility] from home after being found altered the past day. Patient nonverbal per EMS and does correct MD when he speaks with her. CBG 112 Last full dialysis Saturday and missed yesterday. Patient normally ambulatory but has remained  In bed today and incontinent of stool. Alert to person and birth month- Difficulty following commands     Allergies Allergies  Allergen Reactions   Shrimp [Shellfish Allergy] Shortness Of Breath   Bactroban [Mupirocin] Other (See Comments)    "Sores in nose"   Vicodin [Hydrocodone-Acetaminophen] Itching and Nausea And Vomiting    This is patient's home medication   Eliquis [Apixaban] Itching    Spoke with patient, no rash.  Has tolerated on re-challenge.   Lisinopril Cough   Tylenol [Acetaminophen] Itching    Level of Care/Admitting Diagnosis ED Disposition     ED Disposition  Admit   Condition  --   Comment  Hospital Area: [place] [100100]  Level of Care: ICU [6]  May admit patient to [facility] or [facility] if equivalent level of care is available:: Yes  Covid Evaluation: Asymptomatic - no recent exposure (last 10 days) testing not required  Diagnosis: Acute metabolic encephalopathy [9741638]  Admitting Physician: Candee Furbish [[id number]]  Attending Physician: Candee Furbish [[id number]]  Certification:: I certify this patient will need inpatient services for at least 2 midnights  Estimated Length of Stay: 3          B Medical/Surgery History Past Medical History:  Diagnosis Date   AF (paroxysmal atrial fibrillation)  ([place]) 05/29/2019   on Coumadin   Aortic atherosclerosis ([place]) 07/05/2019   Aortic dissection ([place]) 04/04/2019   s/p repair   Bone spur 2008   Right calcaneal foot spur   Breast cancer ([place]) 2004   Ductal carcinoma in situ of the left breast; S/P left partial mastectomy 02/26/2003; S/P re-excision of cranial and lateral margins11/18/2004.radiation   Cerebral thrombosis with cerebral infarction 05/22/2019   Chronic low back pain 06/22/2016   Chronic obstructive lung disease ([place]) 01/16/2017   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 Autumn Messing; S/P re-excision of superior and inferior margins 10/27/2012.    ESRD on hemodialysis ([place]) 05/29/2019   Essential hypertension 09/16/2006   GERD 09/16/2006   Hepatitis C    treated and RNA confirmed not detectable 01/2017   Insomnia 03/14/2015   Malnutrition of moderate degree 05/19/2019   Non compliance with medical treatment 12/04/2017   Normocytic anemia    With thrombocytosis   Osteoarthritis    Right ureteral stone 2002   S/P lumbar spinal fusion 01/18/2014   S/P lumbar decompressive laminectomy, fusion, and plating for lumbar spinal stenosis on 05/27/2009 by Dr. Eustace Moore.  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 Eustace Moore.     Tobacco use disorder 04/19/2009   Uterine fibroid    Wears dentures    top  Past Surgical History:  Procedure Laterality Date   ANTERIOR LAT LUMBAR FUSION N/A 01/18/2014   Procedure: ANTERIOR LATERAL LUMBAR FUSION LUMBAR TWO-THREE;  Surgeon: Eustace Moore, MD;  Location: [place] NEURO ORS;  Service: Neurosurgery;  Laterality: N/A;   ANTERIOR LUMBAR FUSION  01/18/2014   AV FISTULA PLACEMENT Left 04/20/2019   Procedure: ARTERIOVENOUS (AV) FISTULA CREATION;   Surgeon: [name], MD;  Location: [place];  Service: Vascular;  Laterality: Left;   BACK SURGERY     BREAST LUMPECTOMY Left 01/2003   BREAST LUMPECTOMY Right 2014   BREAST LUMPECTOMY WITH NEEDLE LOCALIZATION AND AXILLARY SENTINEL LYMPH NODE BX Right 10/13/2012   Procedure: BREAST LUMPECTOMY WITH NEEDLE LOCALIZATION;  Surgeon: [name], MD;  Location: [place];  Service: General;  Laterality: Right;  Right breast wire localized lumpectomy   INSERTION OF DIALYSIS CATHETER Right 04/20/2019   Procedure: INSERTION OF DIALYSIS CATHETER, right internal jugular;  Surgeon: [name], MD;  Location: [place];  Service: Vascular;  Laterality: Right;   INSERTION OF DIALYSIS CATHETER Right 09/24/2020   Procedure: INSERTION OF TUNNELED DIALYSIS CATHETER;  Surgeon: Rosetta Posner, MD;  Location: [place];  Service: Vascular;  Laterality: Right;   IR FLUORO GUIDE CV LINE RIGHT  09/22/2020   IR THORACENTESIS ASP PLEURAL SPACE W/IMG GUIDE  05/19/2019   IR US GUIDE VASC ACCESS LEFT  09/22/2020   IR US GUIDE VASC ACCESS RIGHT  09/22/2020   IR VENOCAVAGRAM SVC  09/22/2020   LAMINECTOMY  05/27/2009   Lumbar decompressive laminectomy, fusion and plating for lumbar spinal stensosis   LIGATION OF ARTERIOVENOUS  FISTULA Left 09/24/2020   Procedure: LIGATION OF LEFT ARM ARTERIOVENOUS  FISTULA;  Surgeon: Rosetta Posner, MD;  Location: [place];  Service: Vascular;  Laterality: Left;   LUMBAR LAMINECTOMY/DECOMPRESSION MICRODISCECTOMY Left 03/23/2013   Procedure: LUMBAR LAMINECTOMY/DECOMPRESSION MICRODISCECTOMY 1 LEVEL;  Surgeon: Eustace Moore, MD;  [facility];  Service: Neurosurgery;  Laterality: Left;  LUMBAR LAMINECTOMY/DECOMPRESSION MICRODISCECTOMY 1 LEVEL   MASTECTOMY, PARTIAL Left 02/26/2003   ; S/P re-excision of cranial and lateral margins 04/19/2003.    RE-EXCISION OF BREAST CANCER,SUPERIOR MARGINS Right 10/27/2012   Procedure: RE-EXCISION OF BREAST CANCER,SUPERIOR  and inferior MARGINS;  Surgeon: [name], MD;  Location: [place];  Service: General;  Laterality: Right;   RE-EXCISION OF BREAST LUMPECTOMY Left 04/2003   TEE WITHOUT CARDIOVERSION N/A 04/04/2019   Procedure: Transesophageal Echocardiogram ([place]);  Surgeon: Wonda Olds, MD;  Location: [place];  Service: Open Heart Surgery;  Laterality: N/A;   THORACIC AORTIC ANEURYSM REPAIR N/A 04/04/2019   Procedure: THORACIC ASCENDING ANEURYSM REPAIR (AAA)  USING 28 MM X 30 CM HEMASHIELD PLATINUM VASCULAR GRAFT;  Surgeon: Wonda Olds, MD;  [facility];  Service: Open Heart Surgery;  Laterality: N/A;     A IV Location/Drains/Wounds Patient Lines/Drains/Airways Status     Active Line/Drains/Airways     Name Placement date Placement time Site Days   Peripheral IV 12/24/21 20 G Anterior;Left Forearm 12/24/21  1230  Forearm  less than 1   Peripheral IV 12/24/21 20 G Anterior;Right;Upper Arm 12/24/21  1330  Arm  less than 1   Hemodialysis Catheter Right Subclavian --  --  Subclavian  --   Hemodialysis Catheter Right Subclavian --  --  Subclavian  --   Incision (Closed) 09/24/20 Arm Left 09/24/20  1317  -- 456   Incision (Closed) 09/24/20 Chest Right 09/24/20  1317  --  456            Intake/Output Last 24 hours No intake or output data in the 24 hours ending 12/24/21 1537  Labs/Imaging Results for orders placed or performed during the hospital encounter of 12/24/21 (from the past 48 hour(s))  CBG monitoring, ED     Status: Abnormal   Collection Time: 12/24/21  1:15 PM  Result Value Ref Range   Glucose-Capillary 100 (H) 70 - 99 mg/dL    Comment: Glucose reference range applies only to samples taken after fasting for at least 8 hours.  Resp Panel by RT-PCR (Flu A&B, Covid) Anterior Nasal Swab     Status: None   Collection Time: 12/24/21  1:34 PM   Specimen: Anterior Nasal Swab  Result Value Ref Range   SARS Coronavirus 2 by RT PCR NEGATIVE NEGATIVE    Comment: (NOTE) SARS-CoV-2  target nucleic acids are NOT DETECTED.  The SARS-CoV-2 RNA is generally detectable in upper respiratory specimens during the acute phase of infection. The lowest concentration of SARS-CoV-2 viral copies this assay can detect is 138 copies/mL. A negative result does not preclude SARS-Cov-2 infection and should not be used as the sole basis for treatment or other patient management decisions. A negative result may occur with  improper specimen collection/handling, submission of specimen other than nasopharyngeal swab, presence of viral mutation(s) within the areas targeted by this assay, and inadequate number of viral copies(<138 copies/mL). A negative result must be combined with clinical observations, patient history, and epidemiological information. The expected result is Negative.  Fact Sheet for Patients:  EntrepreneurPulse.com.au  Fact Sheet for Healthcare Providers:  IncredibleEmployment.be  This test is no t yet approved or cleared by the [place] FDA and  has been authorized for detection and/or diagnosis of SARS-CoV-2 by FDA under an Emergency Use Authorization (EUA). This EUA will remain  in effect (meaning this test can be used) for the duration of the COVID-19 declaration under Section 564(b)(1) of the Act, 21 U.S.C.section 360bbb-3(b)(1), unless the authorization is terminated  or revoked sooner.       Influenza A by PCR NEGATIVE NEGATIVE   Influenza B by PCR NEGATIVE NEGATIVE    Comment: (NOTE) The Xpert Xpress SARS-CoV-2/FLU/RSV plus assay is intended as an aid in the diagnosis of influenza from Nasopharyngeal swab specimens and should not be used as a sole basis for treatment. Nasal washings and aspirates are unacceptable for Xpert Xpress SARS-CoV-2/FLU/RSV testing.  Fact Sheet for Patients: EntrepreneurPulse.com.au  Fact Sheet for Healthcare Providers: IncredibleEmployment.be  This  test is not yet approved or cleared by the [place] FDA and has been authorized for detection and/or diagnosis of SARS-CoV-2 by FDA under an Emergency Use Authorization (EUA). This EUA will remain in effect (meaning this test can be used) for the duration of the COVID-19 declaration under Section 564(b)(1) of the Act, 21 U.S.C. section 360bbb-3(b)(1), unless the authorization is terminated or revoked.  Performed at [facility], [place] [address], [place], [place] [address]   Ethanol     Status: None   Collection Time: 12/24/21  1:41 PM  Result Value Ref Range   Alcohol, Ethyl (B) <10 <10 mg/dL    Comment: (NOTE) Lowest detectable limit for serum alcohol is 10 mg/dL.  For medical purposes only. Performed at [facility], [place] [address], [place], [place] [address]   Protime-INR     Status: Abnormal   Collection Time: 12/24/21  1:41 PM  Result Value Ref Range  Prothrombin Time 17.9 (H) 11.4 - 15.2 seconds   INR 1.5 (H) 0.8 - 1.2    Comment: (NOTE) INR goal varies based on device and disease states. Performed at [facility], [place] [address] [place], [place] [address]   APTT     Status: None   Collection Time: 12/24/21  1:41 PM  Result Value Ref Range   aPTT 36 24 - 36 seconds    Comment: Performed at [facility] [address], [place], [place] [address]  CBC     Status: Abnormal   Collection Time: 12/24/21  1:41 PM  Result Value Ref Range   WBC 4.8 4.0 - 10.5 K/uL   RBC 3.86 (L) 3.87 - 5.11 MIL/uL   Hemoglobin 11.4 (L) 12.0 - 15.0 g/dL   HCT 36.5 36.0 - 46.0 %   MCV 94.6 80.0 - 100.0 fL   MCH 29.5 26.0 - 34.0 pg   MCHC 31.2 30.0 - 36.0 g/dL   RDW 21.3 (H) 11.5 - 15.5 %   Platelets 244 150 - 400 K/uL   nRBC 2.7 (H) 0.0 - 0.2 %    Comment: Performed at [facility] [address], [place], [place] [address]  Differential     Status: Abnormal   Collection Time: 12/24/21  1:41 PM  Result Value Ref Range   Neutrophils Relative % 88 %    Neutro Abs 4.2 1.7 - 7.7 K/uL   Lymphocytes Relative 7 %   Lymphs Abs 0.3 (L) 0.7 - 4.0 K/uL   Monocytes Relative 4 %   Monocytes Absolute 0.2 0.1 - 1.0 K/uL   Eosinophils Relative 0 %   Eosinophils Absolute 0.0 0.0 - 0.5 K/uL   Basophils Relative 1 %   Basophils Absolute 0.0 0.0 - 0.1 K/uL   nRBC 5 (H) 0 /100 WBC   Abs Immature Granulocytes 0.00 0.00 - 0.07 K/uL   Polychromasia PRESENT    Ovalocytes PRESENT     Comment: Performed at [facility] [address], [place], [place] [address]  Comprehensive metabolic panel     Status: Abnormal   Collection Time: 12/24/21  1:41 PM  Result Value Ref Range   Sodium 149 (H) 135 - 145 mmol/L   Potassium 5.9 (H) 3.5 - 5.1 mmol/L   Chloride 104 98 - 111 mmol/L   CO2 21 (L) 22 - 32 mmol/L   Glucose, Bld 104 (H) 70 - 99 mg/dL    Comment: Glucose reference range applies only to samples taken after fasting for at least 8 hours.   BUN 93 (H) 8 - 23 mg/dL   Creatinine, Ser 13.37 (H) 0.44 - 1.00 mg/dL   Calcium 10.1 8.9 - 10.3 mg/dL   Total Protein 7.6 6.5 - 8.1 g/dL   Albumin 4.2 3.5 - 5.0 g/dL   AST 33 15 - 41 U/L   ALT 32 0 - 44 U/L   Alkaline Phosphatase 66 38 - 126 U/L   Total Bilirubin 1.1 0.3 - 1.2 mg/dL   GFR, Estimated 3 (L) >60 mL/min    Comment: (NOTE) Calculated using the CKD-EPI Creatinine Equation (2021)    Anion gap 24 (H) 5 - 15    Comment: Electrolytes repeated to confirm. Performed at [facility], [place] [address], [place], [place] [address]   Ammonia     Status: None   Collection Time: 12/24/21  1:41 PM  Result Value Ref Range   Ammonia 26 9 -  35 umol/L    Comment: Performed at [facility], [place] [address] [place], [place] [address]  I-stat chem 8, ED     Status: Abnormal   Collection Time: 12/24/21  1:53 PM  Result Value Ref Range   Sodium 145 135 - 145 mmol/L   Potassium 5.6 (H) 3.5 - 5.1 mmol/L   Chloride 108 98 - 111 mmol/L   BUN 89 (H) 8 - 23 mg/dL   Creatinine, Ser 14.70 (H) 0.44 - 1.00  mg/dL   Glucose, Bld 105 (H) 70 - 99 mg/dL    Comment: Glucose reference range applies only to samples taken after fasting for at least 8 hours.   Calcium, Ion 1.08 (L) 1.15 - 1.40 mmol/L   TCO2 22 22 - 32 mmol/L   Hemoglobin 12.9 12.0 - 15.0 g/dL   HCT 38.0 36.0 - 46.0 %   CT Head Wo Contrast  Result Date: 12/24/2021 CLINICAL DATA:  Mental status change, dialysis patient EXAM: CT HEAD WITHOUT CONTRAST TECHNIQUE: Contiguous axial images were obtained from the base of the skull through the vertex without intravenous contrast. RADIATION DOSE REDUCTION: This exam was performed according to the departmental dose-optimization program which includes automated exposure control, adjustment of the mA and/or kV according to patient size and/or use of iterative reconstruction technique. COMPARISON:  08/23/2021 FINDINGS: Brain: Stable atrophy pattern and extensive white matter microvascular ischemic changes throughout both cerebral hemispheres. No acute intracranial hemorrhage, mass lesion, midline shift, herniation, or hydrocephalus. No focal mass effect or edema. Cisterns are patent. No cerebellar abnormality. Vascular: No hyperdense vessel or unexpected calcification. Skull: Normal. Negative for fracture or focal lesion. Sinuses/Orbits: No acute finding. Other: None. IMPRESSION: Stable atrophy and white matter microvascular ischemic changes. No acute intracranial abnormality or interval change by noncontrast CT. Electronically Signed   By: Jerilynn Mages.  Shick M.D.   On: 12/24/2021 14:41   DG Chest Port 1 View  Result Date: 12/24/2021 CLINICAL DATA:  Altered mental status EXAM: PORTABLE CHEST 1 VIEW COMPARISON:  12/07/2021 FINDINGS: Transverse diameter of the heart is increased. There is interval decrease in pulmonary vascular congestion and clearing of pulmonary edema. There are no new focal infiltrates. There is no significant pleural effusion or pneumothorax. Metallic sutures are seen in the sternum. Tip of dialysis  catheter is seen in the region of right atrium. IMPRESSION: Cardiomegaly. There are no signs of pulmonary edema or focal pulmonary consolidation. Electronically Signed   By: Elmer Picker M.D.   On: 12/24/2021 14:00    Pending Labs Unresulted Labs (From admission, onward)     Start     Ordered   12/24/21 1320  Urine rapid drug screen (hosp performed)  Once,   STAT        12/24/21 1320   12/24/21 1320  Urinalysis, Routine w reflex microscopic  Once,   URGENT        12/24/21 1320            Vitals/Pain Today's Vitals   12/24/21 1430 12/24/21 1500 12/24/21 1515 12/24/21 1527  BP: (!) 180/128 (!) 184/95 (!) 152/80   Pulse: 71 78 73 73  Resp: (!) 25 13 (!) 31 14  Temp:      TempSrc:      SpO2: 99% 97% 94% 96%  PainSc:        Isolation Precautions No active isolations  Medications Medications  clevidipine (CLEVIPREX) infusion 0.5 mg/mL (2 mg/hr Intravenous Restarted 12/24/21 1447)  docusate sodium (COLACE) capsule 100 mg (has  no administration in time range)  polyethylene glycol (MIRALAX / GLYCOLAX) packet 17 g (has no administration in time range)  heparin injection 5,000 Units (has no administration in time range)  labetalol (NORMODYNE) injection 10 mg (10 mg Intravenous Given 12/24/21 1327)    Mobility walks with person assist High fall risk   Focused Assessments    R Recommendations: See Admitting Provider Note  Report given to:   Additional Notes:

## 2021-12-24 NOTE — ED Notes (Signed)
Pt transported to CT ?

## 2021-12-25 DIAGNOSIS — G9341 Metabolic encephalopathy: Secondary | ICD-10-CM | POA: Diagnosis not present

## 2021-12-25 LAB — RENAL FUNCTION PANEL
Albumin: 3.5 g/dL (ref 3.5–5.0)
Anion gap: 19 — ABNORMAL HIGH (ref 5–15)
BUN: 35 mg/dL — ABNORMAL HIGH (ref 8–23)
CO2: 23 mmol/L (ref 22–32)
Calcium: 9.3 mg/dL (ref 8.9–10.3)
Chloride: 96 mmol/L — ABNORMAL LOW (ref 98–111)
Creatinine, Ser: 6.22 mg/dL — ABNORMAL HIGH (ref 0.44–1.00)
GFR, Estimated: 7 mL/min — ABNORMAL LOW (ref >60)
Glucose, Bld: 92 mg/dL (ref 70–99)
Phosphorus: 6.5 mg/dL — ABNORMAL HIGH (ref 2.5–4.6)
Potassium: 4 mmol/L (ref 3.5–5.1)
Sodium: 138 mmol/L (ref 135–145)

## 2021-12-25 LAB — BASIC METABOLIC PANEL
Anion gap: 19 — ABNORMAL HIGH (ref 5–15)
BUN: 112 mg/dL — ABNORMAL HIGH (ref 8–23)
CO2: 24 mmol/L (ref 22–32)
Calcium: 9.5 mg/dL (ref 8.9–10.3)
Chloride: 106 mmol/L (ref 98–111)
Creatinine, Ser: 14.47 mg/dL — ABNORMAL HIGH (ref 0.44–1.00)
GFR, Estimated: 2 mL/min — ABNORMAL LOW (ref >60)
Glucose, Bld: 91 mg/dL (ref 70–99)
Potassium: 5 mmol/L (ref 3.5–5.1)
Sodium: 149 mmol/L — ABNORMAL HIGH (ref 135–145)

## 2021-12-25 NOTE — Consult Note (Addendum)
   [facility] Inpatient Consult   12/25/2021  MADAY GUARINO 09/08/1950 [id number]  [employer] [ACO] Patient: Monique Henderson Medicare  *addendum:  12/26/21 10:05 am patient is [employer] SNP   Patient on high risk less than 30 days readmission risk  Primary Care Provider:  Pcp, No [need to check for listed provider with [facility]].  12/26/21 100:05 am Addendum: listed with Merri Ray, MD and Maximiano Coss also last visit 02/14/2021 listed n encounter Met with the patient at the bedside, patient able to state her name and birthdate but asked about primary care and insurance she just shakes her head. Explained reason for visit and she doesn't "remember seeing any particular doctor."  When asked about transportation to dialysis she states, "sometimes they pick me up, sometimes I go and then sometimes I just don't go.  They say I need to go but sometimes I just don't go." Affect flat but smiles during some engagement.   With [employer] SNP this patient's care management is assigned to that plan in the [facility] team. Reviewed dialysis navigator's note.  Patient screened for hospitalization with noted high risk score for unplanned readmission risk and  to assess for potential [place] [facility] service needs for post hospital transition.  Review of patient's medical record reveals patient is showing patient currently has no [facility] primary care provider.  Patient's chart review MD and nursing progress notes current mental status. Patient is ESRD with HD, brief chart review for needs.  Plan: Patient followed by [facility] SNP team.    For questions contact:   Natividad Brood, RN BSN [facility] Liaison  [phone] business mobile phone Toll free office [phone]  Fax number: [phone] [email or web address]

## 2021-12-25 NOTE — Progress Notes (Signed)
Received patient in bed, alert and oriented. Informed consent signed and in chart.  Time tx completed:42.7 kg  HD treatment completed. Patient tolerated well. Fistula/Graft/HD catheter without signs and symptoms of complications. Patient transported back to the room, alert and in no acute distress. Report given to TAI  Trijani, Therapist, sports.  Total UF removed:2572m  Medication given: none  Post HD VS:  Post HD weight: 40.7kg

## 2021-12-25 NOTE — Progress Notes (Signed)
Pt receives out-pt HD at [facility] SW on TTS. Pt arrives at 11:25 for 11:45 chair time. Will assist as needed.   Melven Sartorius Renal Navigator [phone]

## 2021-12-25 NOTE — Progress Notes (Signed)
Pt very sleepy this am unable to take po medications or awake enough to eat.

## 2021-12-25 NOTE — TOC Initial Note (Signed)
Transition of Care [facility]) - Initial/Assessment Note    Patient Details  Name: Monique Henderson MRN: [id number] Date of Birth: June 15, 1950  Transition of Care [facility]) CM/SW Contact:    Ninfa Meeker, RN Phone Number: 12/25/2021, 2:37 PM  Clinical Narrative:                 Transition of Care Screening Note:  Transition of Care Department [facility]) has reviewed patient and no TOC needs have been identified at this time. We will continue to monitor patient advancement through Interdisciplinary progressions. If new patient transition needs arise, please place a consult.         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 [D35.7] Acute metabolic encephalopathy [S17.79] Patient Active Problem List   Diagnosis Date Noted   Acute metabolic encephalopathy 39/07/90   Malignant HTN with heart disease, w/o CHF, 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 12/24/2021   History of hepatitis C 12/08/2021   Hypertensive urgency 10/29/2021   Fluid overload 10/29/2021   Acute on chronic HFrEF (heart failure with reduced ejection fraction) ([place]) 10/29/2021   Chronic anticoagulation 10/29/2021   Prolonged QT interval 10/29/2021   Leukopenia 10/29/2021   SOB (shortness of breath) 09/15/2021   Hypertensive emergency 08/23/2021   Altered mental status    ESRD (end stage renal disease) on dialysis ([place]) 08/12/2021   Malignant hypertension 06/19/2021   Anemia 06/05/2021   Pure hypercholesterolemia 02/14/2021   Acute pulmonary edema ([place])  12/23/2020   Acute respiratory failure with hypoxia ([place]) 09/21/2020   Acute renal failure superimposed on chronic kidney disease ([place]) 09/21/2020   Community acquired pneumonia 09/21/2020   Uremia 33/00/7622   Metabolic acidosis with increased anion gap and accumulation of organic acids 09/21/2020   Hypertensive crisis 09/21/2020   Troponin level elevated 08/28/2019   Positive D dimer 08/28/2019   Hyperkalemia 08/28/2019   SVT (supraventricular tachycardia) ([place]) 08/28/2019   SIRS (systemic inflammatory response syndrome) ([place]) 08/27/2019   Paroxysmal SVT (supraventricular tachycardia) ([place]) 08/27/2019   Aortic atherosclerosis ([place]) 07/05/2019   Chest pain 07/05/2019   History of CVA (cerebrovascular accident) 05/29/2019   AF (paroxysmal atrial fibrillation) ([place]) 05/29/2019   ESRD on dialysis ([place]) 05/29/2019   Cerebral thrombosis with cerebral infarction 05/22/2019   Malnutrition of moderate degree 05/19/2019   Pleural effusion 05/18/2019   Atrial fibrillation with RVR ([place]) 05/18/2019   S/P aortic aneurysm repair 04/07/2019   Aortic dissection ([place]) 04/04/2019   Dissection of aorta ([place]) 04/03/2019   Non compliance with medical treatment 12/04/2017   CKD (chronic kidney disease), stage IV ([place]) 07/19/2017   Chronic obstructive lung disease ([place]) 01/16/2017  Chronic low back pain 06/22/2016   Insomnia 03/14/2015   S/P lumbar spinal fusion 01/18/2014   Tobacco use disorder 04/19/2009   Essential hypertension 09/16/2006   GERD 09/16/2006   PCP:  Pcp, No Pharmacy:   [place], [place] [address] Phone: [phone] Fax: [phone]     Social Determinants of Health (SDOH) Interventions    Readmission Risk Interventions    05/22/2019    4:31 PM  Readmission Risk Prevention Plan  Transportation Screening Complete  PCP or Specialist Appt within 3-5 Days Complete  HRI or [place] Complete  Social Work Consult for [place] Planning/Counseling Complete  Palliative Care Screening Not Applicable  Medication Review Press photographer) Complete

## 2021-12-25 NOTE — Progress Notes (Signed)
Transport here to take her to dialysis.

## 2021-12-25 NOTE — Progress Notes (Signed)
Heart Failure Navigator Progress Note  Assessed for Heart & Vascular TOC clinic readiness.  Patient does not meet criteria due to Hemodialysis.     Monique Henderson, BSN, RN Heart Failure Nurse Navigator Secure Chat Only   

## 2021-12-25 NOTE — Progress Notes (Addendum)
PROGRESS NOTE    Monique Henderson  FKC:[id number] DOB: 08-18-1950 DOA: 12/24/2021 PCP: Pcp, No    Brief Narrative:  71 year old with history of ESRD on hemodialysis TTS schedule, hypertension, paroxysmal A-fib on Eliquis, history of polysubstance abuse, recent history of cocaine use, aortic aneurysm and dissection status postrepair presented from home with altered mental status.  On admission, admitting doctors were not able to reach to the family. Attempted to call significant other and sister on the chart, unable to reach.  Patient remains confused after dialysis also. In the emergency room blood pressures more than 200, multiple IV blood pressure medications were started.  Underwent emergent hemodialysis overnight.  Hemodynamically stabilized, however still confused.  Addendum : sister and niece called back , they told me that patient is very ambulatory and she even drives herself.  They do suspect she might have been using drugs.  They will go around her house and tried to talk to her significant other and also find out if there is any unsafe environment.  Assessment & Plan:   Acute metabolic encephalopathy: Suspect multifactorial. Blood pressures more than 200 on presentation that is improving now. She does have history of drug use, recent cocaine positive UDS.  No urine yet.  We will check for UDS. No evidence of bacterial infection. No focal deficit.  CT head was without any acute findings. Neurochecks.  Monitoring in the hospital.  Hypertensive encephalopathy: Currently remains on IV hydralazine.  We will resume her home medications as soon as she is safe to take it.  ESRD on hemodialysis, missed hemodialysis and hyperkalemia: Underwent emergent dialysis overnight.  Stabilized.  Paroxysmal A-fib: Currently rate controlled.  Will resume Eliquis once she wakes up.   DVT prophylaxis: heparin injection 5,000 Units Start: 12/24/21 1530 SCDs Start: 12/24/21 1522   Code Status:  Full code Family Communication: Sister and niece on the phone. Disposition Plan: Status is: Inpatient Remains inpatient appropriate because: Altered mental status     Consultants:  Nephrology  Procedures:  Dialysis  Antimicrobials:  None   Subjective: Patient was seen and examined.  Follows simple commands on external stimulation but mostly remains sleepy.  Telemetry monitor with A-fib but rate controlled.  She is currently on room air.  Objective: Vitals:   12/25/21 0650 12/25/21 0702 12/25/21 0727 12/25/21 0747  BP: (!) 153/87 (!) 189/97  (!) 169/100  Pulse: (!) 58 (!) 59  (!) 59  Resp: (!) 0 (!) 23  15  Temp:  98.6 F (37 C)  97.9 F (36.6 C)  TempSrc:  Oral  Oral  SpO2: 100% 100%  99%  Weight:   40.7 kg   Height:        Intake/Output Summary (Last 24 hours) at 12/25/2021 1040 Last data filed at 12/25/2021 7494 Gross per 24 hour  Intake 100 ml  Output 2.5 ml  Net 97.5 ml   Filed Weights   12/24/21 1815 12/25/21 0033 12/25/21 0727  Weight: 42.3 kg 42.7 kg 40.7 kg    Examination:  Obtunded ,on room air , follows simple commands with very difficulties.     Data Reviewed: I have personally reviewed following labs and imaging studies  CBC: Recent Labs  Lab 12/24/21 1341 12/24/21 1353  WBC 4.8  --   NEUTROABS 4.2  --   HGB 11.4* 12.9  HCT 36.5 38.0  MCV 94.6  --   PLT 244  --    Basic Metabolic Panel: Recent Labs  Lab 12/24/21 1341 12/24/21 1353 12/25/21  0145  NA 149* 145 149*  K 5.9* 5.6* 5.0  CL 104 108 106  CO2 21*  --  24  GLUCOSE 104* 105* 91  BUN 93* 89* 112*  CREATININE 13.37* 14.70* 14.47*  CALCIUM 10.1  --  9.5  PHOS 11.1*  --   --    GFR: Estimated Creatinine Clearance: 2.4 mL/min (A) (by C-G formula based on SCr of 14.47 mg/dL (H)). Liver Function Tests: Recent Labs  Lab 12/24/21 1341  AST 33  ALT 32  ALKPHOS 66  BILITOT 1.1  PROT 7.6  ALBUMIN 4.2   No results for input(s): "LIPASE", "AMYLASE" in the last 168  hours. Recent Labs  Lab 12/24/21 1341  AMMONIA 26   Coagulation Profile: Recent Labs  Lab 12/24/21 1341  INR 1.5*   Cardiac Enzymes: No results for input(s): "CKTOTAL", "CKMB", "CKMBINDEX", "TROPONINI" in the last 168 hours. BNP (last 3 results) No results for input(s): "PROBNP" in the last 8760 hours. HbA1C: No results for input(s): "HGBA1C" in the last 72 hours. CBG: Recent Labs  Lab 12/24/21 1315  GLUCAP 100*   Lipid Profile: No results for input(s): "CHOL", "HDL", "LDLCALC", "TRIG", "CHOLHDL", "LDLDIRECT" in the last 72 hours. Thyroid Function Tests: No results for input(s): "TSH", "T4TOTAL", "FREET4", "T3FREE", "THYROIDAB" in the last 72 hours. Anemia Panel: No results for input(s): "VITAMINB12", "FOLATE", "FERRITIN", "TIBC", "IRON", "RETICCTPCT" in the last 72 hours. Sepsis Labs: No results for input(s): "PROCALCITON", "LATICACIDVEN" in the last 168 hours.  Recent Results (from the past 240 hour(s))  Resp Panel by RT-PCR (Flu A&B, Covid) Anterior Nasal Swab     Status: None   Collection Time: 12/24/21  1:34 PM   Specimen: Anterior Nasal Swab  Result Value Ref Range Status   SARS Coronavirus 2 by RT PCR NEGATIVE NEGATIVE Final    Comment: (NOTE) SARS-CoV-2 target nucleic acids are NOT DETECTED.  The SARS-CoV-2 RNA is generally detectable in upper respiratory specimens during the acute phase of infection. The lowest concentration of SARS-CoV-2 viral copies this assay can detect is 138 copies/mL. A negative result does not preclude SARS-Cov-2 infection and should not be used as the sole basis for treatment or other patient management decisions. A negative result may occur with  improper specimen collection/handling, submission of specimen other than nasopharyngeal swab, presence of viral mutation(s) within the areas targeted by this assay, and inadequate number of viral copies(<138 copies/mL). A negative result must be combined with clinical observations,  patient history, and epidemiological information. The expected result is Negative.  Fact Sheet for Patients:  EntrepreneurPulse.com.au  Fact Sheet for Healthcare Providers:  IncredibleEmployment.be  This test is no t yet approved or cleared by the [place] FDA and  has been authorized for detection and/or diagnosis of SARS-CoV-2 by FDA under an Emergency Use Authorization (EUA). This EUA will remain  in effect (meaning this test can be used) for the duration of the COVID-19 declaration under Section 564(b)(1) of the Act, 21 U.S.C.section 360bbb-3(b)(1), unless the authorization is terminated  or revoked sooner.       Influenza A by PCR NEGATIVE NEGATIVE Final   Influenza B by PCR NEGATIVE NEGATIVE Final    Comment: (NOTE) The Xpert Xpress SARS-CoV-2/FLU/RSV plus assay is intended as an aid in the diagnosis of influenza from Nasopharyngeal swab specimens and should not be used as a sole basis for treatment. Nasal washings and aspirates are unacceptable for Xpert Xpress SARS-CoV-2/FLU/RSV testing.  Fact Sheet for Patients: EntrepreneurPulse.com.au  Fact Sheet for Healthcare Providers:  IncredibleEmployment.be  This test is not yet approved or cleared by the [place] and has been authorized for detection and/or diagnosis of SARS-CoV-2 by FDA under an Emergency Use Authorization (EUA). This EUA will remain in effect (meaning this test can be used) for the duration of the COVID-19 declaration under Section 564(b)(1) of the Act, 21 U.S.C. section 360bbb-3(b)(1), unless the authorization is terminated or revoked.  Performed at [facility], [place] [address] [place], [place] [address]          Radiology Studies: CT Head Wo Contrast  Result Date: 12/24/2021 CLINICAL DATA:  Mental status change, dialysis patient EXAM: CT HEAD WITHOUT CONTRAST TECHNIQUE: Contiguous axial images were  obtained from the base of the skull through the vertex without intravenous contrast. RADIATION DOSE REDUCTION: This exam was performed according to the departmental dose-optimization program which includes automated exposure control, adjustment of the mA and/or kV according to patient size and/or use of iterative reconstruction technique. COMPARISON:  08/23/2021 FINDINGS: Brain: Stable atrophy pattern and extensive white matter microvascular ischemic changes throughout both cerebral hemispheres. No acute intracranial hemorrhage, mass lesion, midline shift, herniation, or hydrocephalus. No focal mass effect or edema. Cisterns are patent. No cerebellar abnormality. Vascular: No hyperdense vessel or unexpected calcification. Skull: Normal. Negative for fracture or focal lesion. Sinuses/Orbits: No acute finding. Other: None. IMPRESSION: Stable atrophy and white matter microvascular ischemic changes. No acute intracranial abnormality or interval change by noncontrast CT. Electronically Signed   By: Jerilynn Mages.  Shick M.D.   On: 12/24/2021 14:41   DG Chest Port 1 View  Result Date: 12/24/2021 CLINICAL DATA:  Altered mental status EXAM: PORTABLE CHEST 1 VIEW COMPARISON:  12/07/2021 FINDINGS: Transverse diameter of the heart is increased. There is interval decrease in pulmonary vascular congestion and clearing of pulmonary edema. There are no new focal infiltrates. There is no significant pleural effusion or pneumothorax. Metallic sutures are seen in the sternum. Tip of dialysis catheter is seen in the region of right atrium. IMPRESSION: Cardiomegaly. There are no signs of pulmonary edema or focal pulmonary consolidation. Electronically Signed   By: Elmer Picker M.D.   On: 12/24/2021 14:00        Scheduled Meds:  amLODipine  10 mg Oral Daily   carvedilol  12.5 mg Oral BID WC   Chlorhexidine Gluconate Cloth  6 each Topical Q0600   heparin  5,000 Units Subcutaneous Q8H   hydrALAZINE  25 mg Oral BID   isosorbide  mononitrate  30 mg Oral Daily   sodium chloride flush  3 mL Intravenous Q12H   Continuous Infusions:  sodium chloride       LOS: 1 day    Time spent: 35 minutes     Barb Merino, MD [facility] Pager [phone]

## 2021-12-25 NOTE — Progress Notes (Signed)
[facility] Progress Note   Subjective: Seen in room. Know patient well from OP HD center but she does not recognize me. Not answering questions, mumbling to herself, staring into space.   Objective Vitals:   12/25/21 0650 12/25/21 0702 12/25/21 0727 12/25/21 0747  BP: (!) 153/87 (!) 189/97  (!) 169/100  Pulse: (!) 58 (!) 59  (!) 59  Resp: (!) 0 (!) 23  15  Temp:  98.6 F (37 C)  97.9 F (36.6 C)  TempSrc:  Oral  Oral  SpO2: 100% 100%  99%  Weight:   40.7 kg   Height:       Physical Exam General: Cachetic appearing older female in NAD Neuro: Not answering questions or following commands. Heart: S1,S2 RRR SR on monitor. Lungs: CTAB A/P Abdomen: NABS, flat Extremities:No LE edema Dialysis Access: RIJ TDC drsg intact  Additional Objective Labs: Basic Metabolic Panel: Recent Labs  Lab 12/24/21 1341 12/24/21 1353 12/25/21 0145  NA 149* 145 149*  K 5.9* 5.6* 5.0  CL 104 108 106  CO2 21*  --  24  GLUCOSE 104* 105* 91  BUN 93* 89* 112*  CREATININE 13.37* 14.70* 14.47*  CALCIUM 10.1  --  9.5  PHOS 11.1*  --   --    Liver Function Tests: Recent Labs  Lab 12/24/21 1341  AST 33  ALT 32  ALKPHOS 66  BILITOT 1.1  PROT 7.6  ALBUMIN 4.2   No results for input(s): "LIPASE", "AMYLASE" in the last 168 hours. CBC: Recent Labs  Lab 12/24/21 1341 12/24/21 1353  WBC 4.8  --   NEUTROABS 4.2  --   HGB 11.4* 12.9  HCT 36.5 38.0  MCV 94.6  --   PLT 244  --    Blood Culture    Component Value Date/Time   SDES URINE, CLEAN CATCH 08/12/2021 1250   SPECREQUEST  08/12/2021 1250    NONE Performed at [facility], [place] [address], [place], [place] [address]    CULT MULTIPLE SPECIES PRESENT, SUGGEST RECOLLECTION (A) 08/12/2021 1250   REPTSTATUS 08/15/2021 FINAL 08/12/2021 1250    Cardiac Enzymes: No results for input(s): "CKTOTAL", "CKMB", "CKMBINDEX", "TROPONINI" in the last 168 hours. CBG: Recent Labs  Lab 12/24/21 1315  GLUCAP 100*    Iron Studies: No results for input(s): "IRON", "TIBC", "TRANSFERRIN", "FERRITIN" in the last 72 hours. '@[email or web address]'$ @ Studies/Results: CT Head Wo Contrast  Result Date: 12/24/2021 CLINICAL DATA:  Mental status change, dialysis patient EXAM: CT HEAD WITHOUT CONTRAST TECHNIQUE: Contiguous axial images were obtained from the base of the skull through the vertex without intravenous contrast. RADIATION DOSE REDUCTION: This exam was performed according to the departmental dose-optimization program which includes automated exposure control, adjustment of the mA and/or kV according to patient size and/or use of iterative reconstruction technique. COMPARISON:  08/23/2021 FINDINGS: Brain: Stable atrophy pattern and extensive white matter microvascular ischemic changes throughout both cerebral hemispheres. No acute intracranial hemorrhage, mass lesion, midline shift, herniation, or hydrocephalus. No focal mass effect or edema. Cisterns are patent. No cerebellar abnormality. Vascular: No hyperdense vessel or unexpected calcification. Skull: Normal. Negative for fracture or focal lesion. Sinuses/Orbits: No acute finding. Other: None. IMPRESSION: Stable atrophy and white matter microvascular ischemic changes. No acute intracranial abnormality or interval change by noncontrast CT. Electronically Signed   By: Jerilynn Mages.  Shick M.D.   On: 12/24/2021 14:41   DG Chest Port 1 View  Result Date: 12/24/2021 CLINICAL DATA:  Altered mental status EXAM: PORTABLE CHEST 1  VIEW COMPARISON:  12/07/2021 FINDINGS: Transverse diameter of the heart is increased. There is interval decrease in pulmonary vascular congestion and clearing of pulmonary edema. There are no new focal infiltrates. There is no significant pleural effusion or pneumothorax. Metallic sutures are seen in the sternum. Tip of dialysis catheter is seen in the region of right atrium. IMPRESSION: Cardiomegaly. There are no signs of pulmonary edema or focal pulmonary  consolidation. Electronically Signed   By: Elmer Picker M.D.   On: 12/24/2021 14:00   Medications:  sodium chloride      amLODipine  10 mg Oral Daily   carvedilol  12.5 mg Oral BID WC   Chlorhexidine Gluconate Cloth  6 each Topical Q0600   heparin  5,000 Units Subcutaneous Q8H   hydrALAZINE  25 mg Oral BID   isosorbide mononitrate  30 mg Oral Daily   sodium chloride flush  3 mL Intravenous Q12H     OP HD: AF TTS  3h 42mn   400/1.5  44kg  2/2 bath P2  Hep none  RIJ TDC - last HD 7/22, post wt 45kg, usual UFG 1-2kg - last Hb 10.5 on 7/20 - mircera 50 mcg q2, last 7/18, due early Aug - iron sucrose 100 iv q hd thru 8/17 - doxercalciferol 2 ug iv tiw   Assessment/ Plan: AMS - in setting of uncont HTN. Workup in progress. To be admitted to ICU.  HTN / vol - w/ HTN urgency, started on Cleviprex in ED Now on IV hydralazine. Resume home medications. Chronic noncompliance with antihypertensive medications. Does not look vol overloaded on exam.  ESRD - on HD TTS. Missed HD yesterday, last HD was last Sat. K+ up slightly. HD today, finished HD around 0600. Next HD 12/27/2021 Hyperkalemia -HD earlier this AM. No repeat labs. Will order.  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 Will hold IV vdra for now. Cont binders, add on phos.  GOC-patient has over 13 admissions/ED visits this year for non-adherence to medications/HD. Appears to have FFT, lack of ability to care for herself. Will ask Palliative Care to see patient for goals of care.    Lady Wisham H. Tria Noguera NP-C 12/25/2021, 12:21 PM  CNewell [phone]

## 2021-12-26 DIAGNOSIS — G9341 Metabolic encephalopathy: Secondary | ICD-10-CM | POA: Diagnosis not present

## 2021-12-26 LAB — RENAL FUNCTION PANEL
Albumin: 3.4 g/dL — ABNORMAL LOW (ref 3.5–5.0)
Anion gap: 15 (ref 5–15)
BUN: 50 mg/dL — ABNORMAL HIGH (ref 8–23)
CO2: 25 mmol/L (ref 22–32)
Calcium: 9.5 mg/dL (ref 8.9–10.3)
Chloride: 97 mmol/L — ABNORMAL LOW (ref 98–111)
Creatinine, Ser: 8.11 mg/dL — ABNORMAL HIGH (ref 0.44–1.00)
GFR, Estimated: 5 mL/min — ABNORMAL LOW (ref >60)
Glucose, Bld: 90 mg/dL (ref 70–99)
Phosphorus: 8.8 mg/dL — ABNORMAL HIGH (ref 2.5–4.6)
Potassium: 4.5 mmol/L (ref 3.5–5.1)
Sodium: 137 mmol/L (ref 135–145)

## 2021-12-26 LAB — CBC
HCT: 38 % (ref 36.0–46.0)
Hemoglobin: 12.5 g/dL (ref 12.0–15.0)
MCH: 29.9 pg (ref 26.0–34.0)
MCHC: 32.9 g/dL (ref 30.0–36.0)
MCV: 90.9 fL (ref 80.0–100.0)
Platelets: 238 10*3/uL (ref 150–400)
RBC: 4.18 MIL/uL (ref 3.87–5.11)
RDW: 21.5 % — ABNORMAL HIGH (ref 11.5–15.5)
WBC: 4.5 10*3/uL (ref 4.0–10.5)
nRBC: 0 % (ref 0.0–0.2)

## 2021-12-26 MED ORDER — HYDRALAZINE HCL 50 MG PO TABS
50.0000 mg | ORAL_TABLET | Freq: Two times a day (BID) | ORAL | Status: DC
  Administered 2021-12-26 – 2021-12-29 (×8): 50 mg via ORAL
  Filled 2021-12-26 (×8): qty 1

## 2021-12-26 MED ORDER — PENTAFLUOROPROP-TETRAFLUOROETH EX AERO: 1.0000 | INHALATION_SPRAY | CUTANEOUS | Status: DC | PRN

## 2021-12-26 MED ORDER — APIXABAN 2.5 MG PO TABS
2.5000 mg | ORAL_TABLET | Freq: Two times a day (BID) | ORAL | Status: DC
  Administered 2021-12-26 – 2021-12-29 (×8): 2.5 mg via ORAL
  Filled 2021-12-26 (×8): qty 1

## 2021-12-26 MED ORDER — ALTEPLASE 2 MG IJ SOLR: 2.0000 mg | Freq: Once | INTRAMUSCULAR | Status: DC | PRN

## 2021-12-26 MED ORDER — LIDOCAINE HCL (PF) 1 % IJ SOLN: 5.0000 mL | INTRAMUSCULAR | Status: DC | PRN

## 2021-12-26 MED ORDER — LOSARTAN POTASSIUM 50 MG PO TABS
100.0000 mg | ORAL_TABLET | Freq: Every day | ORAL | Status: DC
  Administered 2021-12-26 – 2021-12-29 (×4): 100 mg via ORAL
  Filled 2021-12-26 (×4): qty 2

## 2021-12-26 MED ORDER — ALBUTEROL SULFATE (2.5 MG/3ML) 0.083% IN NEBU: 2.5000 mg | INHALATION_SOLUTION | Freq: Four times a day (QID) | RESPIRATORY_TRACT | Status: DC | PRN

## 2021-12-26 MED ORDER — HEPARIN SODIUM (PORCINE) 1000 UNIT/ML DIALYSIS: 1000.0000 [IU] | INTRAMUSCULAR | Status: DC | PRN

## 2021-12-26 MED ORDER — ALBUTEROL SULFATE HFA 108 (90 BASE) MCG/ACT IN AERS: 2.0000 | INHALATION_SPRAY | Freq: Four times a day (QID) | RESPIRATORY_TRACT | Status: DC | PRN

## 2021-12-26 MED ORDER — LIDOCAINE-PRILOCAINE 2.5-2.5 % EX CREA: 1.0000 | TOPICAL_CREAM | CUTANEOUS | Status: DC | PRN

## 2021-12-26 NOTE — Progress Notes (Signed)
PROGRESS NOTE    Monique Henderson  HKV:[id number] DOB: [birth year] DOA: 12/24/2021 PCP: Pcp, No    Brief Narrative:  71 year old with history of ESRD on hemodialysis TTS schedule, hypertension, paroxysmal A-fib on Eliquis, history of polysubstance abuse, recent history of cocaine use, aortic aneurysm and dissection status postrepair presented from home with altered mental status.  On admission, admitting doctors were not able to reach to the family. Attempted to call significant other and sister on the chart, unable to reach.  Patient remains confused after dialysis also. In the emergency room blood pressures more than 200, multiple IV blood pressure medications were started.  Underwent emergent hemodialysis overnight.  Hemodynamically stabilized, however still confused. 7/28, mental status much improved but is still confabulating.   Assessment & Plan:   Acute metabolic encephalopathy: Suspect multifactorial. Blood pressures more than 200 on presentation that is improving now. She does have history of drug use, recent cocaine positive UDS.  No urine yet.  We will check for UDS if she makes any urine. No evidence of bacterial infection. No focal deficit.  CT head was without any acute findings. Neurochecks.  Monitoring in the hospital. Hold lyrica and Remeron.   Hypertensive encephalopathy: Resume home medications.  Blood pressures better today.  Increase dose of hydralazine today.  ESRD on hemodialysis, missed hemodialysis and hyperkalemia: Underwent emergent dialysis and receiving further dialysis.  Paroxysmal A-fib: Currently rate controlled.  Will resume Eliquis.   DVT prophylaxis: apixaban (ELIQUIS) tablet 2.5 mg Start: 12/26/21 1430 SCDs Start: 12/24/21 1522 apixaban (ELIQUIS) tablet 2.5 mg   Code Status: Full code Family Communication: none  Disposition Plan: Status is: Inpatient Remains inpatient appropriate because: Altered mental status     Consultants:   Nephrology  Procedures:  Dialysis  Antimicrobials:  None   Subjective:  Patient seen and examined.  Apparently she is more awake, however she answers her sister's name for every questions asked.  Repeats same questions again.  Looks more awake, able to eat regular diet.  On room air. When I talked to her sister, her sister tells me that patient is usually alert awake, she drives herself to the dialysis. Strongly suspect additional drug use.   Objective: Vitals:   12/26/21 0730 12/26/21 0759 12/26/21 0930 12/26/21 1200  BP: (!) 184/106 (!) 182/106 (!) 151/94 (!) 147/94  Pulse: 66 65 66 67  Resp: '17 17 19 '$ (!) 24  Temp: 98.4 F (36.9 C) 98.2 F (36.8 C)  98.4 F (36.9 C)  TempSrc: Oral Oral  Oral  SpO2: 100% 98%  97%  Weight:      Height:        Intake/Output Summary (Last 24 hours) at 12/26/2021 1338 Last data filed at 12/26/2021 0956 Gross per 24 hour  Intake 3 ml  Output 0 ml  Net 3 ml   Filed Weights   12/25/21 0033 12/25/21 0727 12/26/21 0345  Weight: 42.7 kg 40.7 kg 40.2 kg    Examination:  General: Frail and debilitated.  Chronically sick looking.  Cachectic. Today looks fairly comfortable.  Alert awake and oriented x1-2.  She knows she is in the hospital.  She is oriented to herself.  She is not oriented to current situation or illness. Cardiovascular: S1-S2 normal.  Regular rate rhythm. Respiratory: Bilateral clear.  No added sound.  Right IJ perm a cath. Gastrointestinal: soft , NT, BS + Ext: no edema or cyanosis.    Data Reviewed: I have personally reviewed following labs and imaging studies  CBC: Recent  Labs  Lab 12/24/21 1341 12/24/21 1353 12/26/21 0600  WBC 4.8  --  4.5  NEUTROABS 4.2  --   --   HGB 11.4* 12.9 12.5  HCT 36.5 38.0 38.0  MCV 94.6  --  90.9  PLT 244  --  623   Basic Metabolic Panel: Recent Labs  Lab 12/24/21 1341 12/24/21 1353 12/25/21 0145 12/25/21 1357 12/26/21 0602  NA 149* 145 149* 138 137  K 5.9* 5.6* 5.0 4.0  4.5  CL 104 108 106 96* 97*  CO2 21*  --  '24 23 25  '$ GLUCOSE 104* 105* 91 92 90  BUN 93* 89* 112* 35* 50*  CREATININE 13.37* 14.70* 14.47* 6.22* 8.11*  CALCIUM 10.1  --  9.5 9.3 9.5  PHOS 11.1*  --   --  6.5* 8.8*   GFR: Estimated Creatinine Clearance: 4.2 mL/min (A) (by C-G formula based on SCr of 8.11 mg/dL (H)). Liver Function Tests: Recent Labs  Lab 12/24/21 1341 12/25/21 1357 12/26/21 0602  AST 33  --   --   ALT 32  --   --   ALKPHOS 66  --   --   BILITOT 1.1  --   --   PROT 7.6  --   --   ALBUMIN 4.2 3.5 3.4*   No results for input(s): "LIPASE", "AMYLASE" in the last 168 hours. Recent Labs  Lab 12/24/21 1341  AMMONIA 26   Coagulation Profile: Recent Labs  Lab 12/24/21 1341  INR 1.5*   Cardiac Enzymes: No results for input(s): "CKTOTAL", "CKMB", "CKMBINDEX", "TROPONINI" in the last 168 hours. BNP (last 3 results) No results for input(s): "PROBNP" in the last 8760 hours. HbA1C: No results for input(s): "HGBA1C" in the last 72 hours. CBG: Recent Labs  Lab 12/24/21 1315  GLUCAP 100*   Lipid Profile: No results for input(s): "CHOL", "HDL", "LDLCALC", "TRIG", "CHOLHDL", "LDLDIRECT" in the last 72 hours. Thyroid Function Tests: No results for input(s): "TSH", "T4TOTAL", "FREET4", "T3FREE", "THYROIDAB" in the last 72 hours. Anemia Panel: No results for input(s): "VITAMINB12", "FOLATE", "FERRITIN", "TIBC", "IRON", "RETICCTPCT" in the last 72 hours. Sepsis Labs: No results for input(s): "PROCALCITON", "LATICACIDVEN" in the last 168 hours.  Recent Results (from the past 240 hour(s))  Resp Panel by RT-PCR (Flu A&B, Covid) Anterior Nasal Swab     Status: None   Collection Time: 12/24/21  1:34 PM   Specimen: Anterior Nasal Swab  Result Value Ref Range Status   SARS Coronavirus 2 by RT PCR NEGATIVE NEGATIVE Final    Comment: (NOTE) SARS-CoV-2 target nucleic acids are NOT DETECTED.  The SARS-CoV-2 RNA is generally detectable in upper respiratory specimens  during the acute phase of infection. The lowest concentration of SARS-CoV-2 viral copies this assay can detect is 138 copies/mL. A negative result does not preclude SARS-Cov-2 infection and should not be used as the sole basis for treatment or other patient management decisions. A negative result may occur with  improper specimen collection/handling, submission of specimen other than nasopharyngeal swab, presence of viral mutation(s) within the areas targeted by this assay, and inadequate number of viral copies(<138 copies/mL). A negative result must be combined with clinical observations, patient history, and epidemiological information. The expected result is Negative.  Fact Sheet for Patients:  EntrepreneurPulse.com.au  Fact Sheet for Healthcare Providers:  IncredibleEmployment.be  This test is no t yet approved or cleared by the [place] FDA and  has been authorized for detection and/or diagnosis of SARS-CoV-2 by FDA under an Emergency Use  Authorization (EUA). This EUA will remain  in effect (meaning this test can be used) for the duration of the COVID-19 declaration under Section 564(b)(1) of the Act, 21 U.S.C.section 360bbb-3(b)(1), unless the authorization is terminated  or revoked sooner.       Influenza A by PCR NEGATIVE NEGATIVE Final   Influenza B by PCR NEGATIVE NEGATIVE Final    Comment: (NOTE) The Xpert Xpress SARS-CoV-2/FLU/RSV plus assay is intended as an aid in the diagnosis of influenza from Nasopharyngeal swab specimens and should not be used as a sole basis for treatment. Nasal washings and aspirates are unacceptable for Xpert Xpress SARS-CoV-2/FLU/RSV testing.  Fact Sheet for Patients: EntrepreneurPulse.com.au  Fact Sheet for Healthcare Providers: IncredibleEmployment.be  This test is not yet approved or cleared by the [place] FDA and has been authorized for detection  and/or diagnosis of SARS-CoV-2 by FDA under an Emergency Use Authorization (EUA). This EUA will remain in effect (meaning this test can be used) for the duration of the COVID-19 declaration under Section 564(b)(1) of the Act, 21 U.S.C. section 360bbb-3(b)(1), unless the authorization is terminated or revoked.  Performed at [facility], [place] [address] [place], [place] [address]          Radiology Studies: CT Head Wo Contrast  Result Date: 12/24/2021 CLINICAL DATA:  Mental status change, dialysis patient EXAM: CT HEAD WITHOUT CONTRAST TECHNIQUE: Contiguous axial images were obtained from the base of the skull through the vertex without intravenous contrast. RADIATION DOSE REDUCTION: This exam was performed according to the departmental dose-optimization program which includes automated exposure control, adjustment of the mA and/or kV according to patient size and/or use of iterative reconstruction technique. COMPARISON:  08/23/2021 FINDINGS: Brain: Stable atrophy pattern and extensive white matter microvascular ischemic changes throughout both cerebral hemispheres. No acute intracranial hemorrhage, mass lesion, midline shift, herniation, or hydrocephalus. No focal mass effect or edema. Cisterns are patent. No cerebellar abnormality. Vascular: No hyperdense vessel or unexpected calcification. Skull: Normal. Negative for fracture or focal lesion. Sinuses/Orbits: No acute finding. Other: None. IMPRESSION: Stable atrophy and white matter microvascular ischemic changes. No acute intracranial abnormality or interval change by noncontrast CT. Electronically Signed   By: Jerilynn Mages.  Shick M.D.   On: 12/24/2021 14:41   DG Chest Port 1 View  Result Date: 12/24/2021 CLINICAL DATA:  Altered mental status EXAM: PORTABLE CHEST 1 VIEW COMPARISON:  12/07/2021 FINDINGS: Transverse diameter of the heart is increased. There is interval decrease in pulmonary vascular congestion and clearing of pulmonary edema.  There are no new focal infiltrates. There is no significant pleural effusion or pneumothorax. Metallic sutures are seen in the sternum. Tip of dialysis catheter is seen in the region of right atrium. IMPRESSION: Cardiomegaly. There are no signs of pulmonary edema or focal pulmonary consolidation. Electronically Signed   By: Elmer Picker M.D.   On: 12/24/2021 14:00        Scheduled Meds:  amLODipine  10 mg Oral Daily   apixaban  2.5 mg Oral BID   carvedilol  12.5 mg Oral BID WC   Chlorhexidine Gluconate Cloth  6 each Topical Q0600   hydrALAZINE  50 mg Oral BID   isosorbide mononitrate  30 mg Oral Daily   losartan  100 mg Oral Daily   sodium chloride flush  3 mL Intravenous Q12H   Continuous Infusions:  sodium chloride       LOS: 2 days    Time spent: 35 minutes     Barb Merino, MD  [facility] Pager [phone]

## 2021-12-26 NOTE — Progress Notes (Signed)
[facility] Progress Note   Subjective:    Seen and examined patient at bedside. Not too interactive. Respond only to simple questions. She's not in acute distress. Tolerated yesterday's HD with net UF 2.5L. SBP improved-currently in 140s. Plan for HD 7/29.   Objective Vitals:   12/26/21 0730 12/26/21 0759 12/26/21 0930 12/26/21 1200  BP: (!) 184/106 (!) 182/106 (!) 151/94 (!) 147/94  Pulse: 66 65 66 67  Resp: '17 17 19 '$ (!) 24  Temp: 98.4 F (36.9 C) 98.2 F (36.8 C)  98.4 F (36.9 C)  TempSrc: Oral Oral  Oral  SpO2: 100% 98%  97%  Weight:      Height:       Physical Exam General: Older female; cachetic appearing; NAD Neuro: Eyes open; answering simple questions "yes/no"  Heart: S1 and S2; No murmurs, gallops, or rubs Lungs: Clear anteriorly; No wheezing, rales, or rhonchi Abdomen: Soft and non-tender Extremities: No edema BLLE Dialysis Access: [facility]   Filed Weights   12/25/21 0033 12/25/21 0727 12/26/21 0345  Weight: 42.7 kg 40.7 kg 40.2 kg    Intake/Output Summary (Last 24 hours) at 12/26/2021 1458 Last data filed at 12/26/2021 0956 Gross per 24 hour  Intake 3 ml  Output 0 ml  Net 3 ml    Additional Objective Labs: Basic Metabolic Panel: Recent Labs  Lab 12/24/21 1341 12/24/21 1353 12/25/21 0145 12/25/21 1357 12/26/21 0602  NA 149*   < > 149* 138 137  K 5.9*   < > 5.0 4.0 4.5  CL 104   < > 106 96* 97*  CO2 21*  --  '24 23 25  '$ GLUCOSE 104*   < > 91 92 90  BUN 93*   < > 112* 35* 50*  CREATININE 13.37*   < > 14.47* 6.22* 8.11*  CALCIUM 10.1  --  9.5 9.3 9.5  PHOS 11.1*  --   --  6.5* 8.8*   < > = values in this interval not displayed.   Liver Function Tests: Recent Labs  Lab 12/24/21 1341 12/25/21 1357 12/26/21 0602  AST 33  --   --   ALT 32  --   --   ALKPHOS 66  --   --   BILITOT 1.1  --   --   PROT 7.6  --   --   ALBUMIN 4.2 3.5 3.4*   No results for input(s): "LIPASE", "AMYLASE" in the last 168 hours. CBC: Recent Labs   Lab 12/24/21 1341 12/24/21 1353 12/26/21 0600  WBC 4.8  --  4.5  NEUTROABS 4.2  --   --   HGB 11.4* 12.9 12.5  HCT 36.5 38.0 38.0  MCV 94.6  --  90.9  PLT 244  --  238   Blood Culture    Component Value Date/Time   SDES URINE, CLEAN CATCH 08/12/2021 1250   SPECREQUEST  08/12/2021 1250    NONE Performed at [facility] [address] [place], [place] [address]    CULT MULTIPLE SPECIES PRESENT, SUGGEST RECOLLECTION (A) 08/12/2021 1250   REPTSTATUS 08/15/2021 FINAL 08/12/2021 1250    Cardiac Enzymes: No results for input(s): "CKTOTAL", "CKMB", "CKMBINDEX", "TROPONINI" in the last 168 hours. CBG: Recent Labs  Lab 12/24/21 1315  GLUCAP 100*   Iron Studies: No results for input(s): "IRON", "TIBC", "TRANSFERRIN", "FERRITIN" in the last 72 hours. Lab Results  Component Value Date   INR 1.5 (H) 12/24/2021   INR 1.7 (H) 08/13/2021   INR  1.5 (H) 06/09/2021   Studies/Results: No results found.  Medications:  sodium chloride      amLODipine  10 mg Oral Daily   apixaban  2.5 mg Oral BID   carvedilol  12.5 mg Oral BID WC   Chlorhexidine Gluconate Cloth  6 each Topical Q0600   hydrALAZINE  50 mg Oral BID   isosorbide mononitrate  30 mg Oral Daily   losartan  100 mg Oral Daily   sodium chloride flush  3 mL Intravenous Q12H    Dialysis Orders: AF TTS  3h 42mn   400/1.5  44kg  2/2 bath P2  Hep none  [facility] TDC - last HD 7/22, post wt 45kg, usual UFG 1-2kg - last Hb 10.5 on 7/20 - mircera 50 mcg q2, last 7/18, due early Aug - iron sucrose 100 iv q hd thru 8/17 - doxercalciferol 2 ug iv tiw  Assessment/Plan: AMS - in setting of uncont HTN. Workup in progress. Also high suspicion for polysubstance abuse.  HTN / vol - w/ HTN urgency: IV Cleviprex off and transitioned to her home medications. Chronic noncompliance with antihypertensive medications. Does not look vol overloaded on exam.  ESRD - on HD TTS. Received HD early yesterday morning. K+ elevated but now coming  down. Next HD 12/27/2021 Hyperkalemia -Resolving with HD.  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 Will hold IV vdra for now. Cont binders, add on phos.  GOC-patient has over 13 admissions/ED visits this year for non-adherence to medications/HD. Appears to have FFT, lack of ability to care for herself. Palliative Care consulted to see patient to initiate goals of care discussions.   CTobie Poet NP [facility] 12/26/2021,2:58 PM  LOS: 2 days

## 2021-12-26 NOTE — Care Management Important Message (Signed)
Important Message  Patient Details  Name: Monique Henderson MRN: [id number] Date of Birth: [birth year]   Medicare Important Message Given:  Yes     [name] 12/26/2021, 9:02 AM

## 2021-12-27 ENCOUNTER — Encounter (HOSPITAL_COMMUNITY): Payer: Self-pay | Admitting: Internal Medicine

## 2021-12-27 DIAGNOSIS — I161 Hypertensive emergency: Secondary | ICD-10-CM

## 2021-12-27 DIAGNOSIS — G9341 Metabolic encephalopathy: Secondary | ICD-10-CM | POA: Diagnosis not present

## 2021-12-27 DIAGNOSIS — Z515 Encounter for palliative care: Secondary | ICD-10-CM | POA: Diagnosis not present

## 2021-12-27 DIAGNOSIS — N186 End stage renal disease: Secondary | ICD-10-CM | POA: Diagnosis not present

## 2021-12-27 DIAGNOSIS — Z7189 Other specified counseling: Secondary | ICD-10-CM

## 2021-12-27 DIAGNOSIS — E875 Hyperkalemia: Secondary | ICD-10-CM

## 2021-12-27 DIAGNOSIS — I674 Hypertensive encephalopathy: Secondary | ICD-10-CM | POA: Diagnosis not present

## 2021-12-27 LAB — RENAL FUNCTION PANEL
Albumin: 3.3 g/dL — ABNORMAL LOW (ref 3.5–5.0)
Anion gap: 17 — ABNORMAL HIGH (ref 5–15)
BUN: 75 mg/dL — ABNORMAL HIGH (ref 8–23)
CO2: 26 mmol/L (ref 22–32)
Calcium: 9.1 mg/dL (ref 8.9–10.3)
Chloride: 96 mmol/L — ABNORMAL LOW (ref 98–111)
Creatinine, Ser: 10.32 mg/dL — ABNORMAL HIGH (ref 0.44–1.00)
GFR, Estimated: 4 mL/min — ABNORMAL LOW (ref >60)
Glucose, Bld: 99 mg/dL (ref 70–99)
Phosphorus: 10.4 mg/dL — ABNORMAL HIGH (ref 2.5–4.6)
Potassium: 4.1 mmol/L (ref 3.5–5.1)
Sodium: 139 mmol/L (ref 135–145)

## 2021-12-27 LAB — URINALYSIS, ROUTINE W REFLEX MICROSCOPIC
Bilirubin Urine: NEGATIVE
Glucose, UA: NEGATIVE mg/dL
Ketones, ur: NEGATIVE mg/dL
Nitrite: NEGATIVE
Protein, ur: >=300 mg/dL — AB
RBC / HPF: >50 RBC/hpf — ABNORMAL HIGH (ref 0–5)
Specific Gravity, Urine: 1.013 (ref 1.005–1.030)
WBC, UA: >50 WBC/hpf — ABNORMAL HIGH (ref 0–5)
pH: 8 (ref 5.0–8.0)

## 2021-12-27 LAB — RAPID URINE DRUG SCREEN, HOSP PERFORMED
Amphetamines: NOT DETECTED
Barbiturates: NOT DETECTED
Benzodiazepines: NOT DETECTED
Cocaine: NOT DETECTED
Opiates: NOT DETECTED
Tetrahydrocannabinol: NOT DETECTED

## 2021-12-27 NOTE — Consult Note (Signed)
Consultation Note Date: 12/27/2021   Patient Name: Monique Henderson  DOB: [birth year]  MRN: [id number]  Age / Sex: 71 y.o., female  PCP: Pcp, No Referring Physician: Aileen Fass, Tammi Klippel, MD  Reason for Consultation: Establishing goals of care  HPI/Patient Profile: 71 y.o. female  with past Henderson history of ESRD on HD, refractory HTN, PAF on Eliquis, chronic heart failure reduced EF, cerebral thrombosis with cerebral infarct 2020, COPD, GERD, hep C, noncompliance, history of polysubstance abuse, aortic aneurysm and dissection status postrepair 2020, left breast cancer 2004 left partial mastectomy, lumbar fusion 2015, former smoker quit approximately 2 years ago, admitted on 12/24/2021 with altered mental status, hypertensive encephalopathy secondary to noncompliance with HD and possible noncompliance with home BP meds.   Clinical Assessment and Goals of Care: Attempted to see Monique Henderson but she was off the floor in hemodialysis.  Return later for consult.  I have reviewed Henderson records including EPIC notes, labs and imaging, received report from RN, assessed the patient.  Ms. Henderson, Monique Henderson, is lying quietly in bed.  She appears acutely/chronically ill and quite frail and thin.  She greets me, making and mostly keeping eye contact.  She is alert, able to make her basic needs known.  She is confused.  I tell her my name, asking her nurse.  She states, "Monique Henderson".  I ask her where we are, what kind of place we are at, a grocery store, a [place]?  She tells me that we are in some sort of [employer]  I reassure her, sharing that it is good that she feels the spirit here.  I ask her the month, and she repeats my name.  There is no family present at bedside.  Unable to hold meaningful goals of care discussion in her current state.  I asked her about her family, and she tells me that she has 2 sons.  I  ask about her son Monique Henderson, she tells me that she does not have a son by this name.  I ask if she lives alone.  She states that she lives with a female friend.  I ask about her significant other, Monique Henderson.  She tells me that they have lived together for 3-1/2 years.  She also tells me that she has been taking hemodialysis for 4 years.  I ask how she was doing in dialysis and she states "okay".  I ask if she would like anything to eat or drink.  She tells me that she would like some dessert.  I offer her a cookie which she is able to eat on her own.  I ask what kind of work she did.  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  I ask about who can make healthcare choices if she were unable.  She states her sons, Monique Henderson and Monique Henderson.   Call to son, Monique Henderson at [phone].  Call cannot be completed as dialed.    Call to sister, Monique Henderson at [phone] to discuss diagnosis prognosis,  Monique Henderson, Monique Henderson wishes, disposition and options.  I introduced Palliative Medicine as specialized Henderson care for people living with serious illness. It focuses on providing relief from the symptoms and stress of a serious illness. The goal is to improve quality of life for both the patient and the family.  We discussed a brief life review of the patient.  Sister/Monique Henderson is able to verify some of what Monique Henderson has told me.  She shares that Monique Henderson has been on hemodialysis for about 4 years.  She shares that her sister lives alone, but has been friends with significant other/Monique Henderson, for about 3 and half years.  She shares that Monique Henderson did have 2 sons, her oldest son died around 1 year ago.  Her youngest son is known as Film/video editor.   We then focused on their current illness.  Monique Henderson and I talk about Monique Henderson's multiple hospitalizations over the last 6 months.  She shares, "some of it is her own fault".  She shares that at times, Monique Henderson eats things that she knows she should not, she also misses dialysis.   She states that Monique Henderson "wants to do it her way".  The natural disease trajectory and expectations at Monique Henderson were discussed.  I shared that just like I am going to get sick again, so will Monique Henderson.  We talk about the benefits of short-term rehab, if qualified, to assist with support until Monique Henderson is more stable.  Advanced directives, concepts specific to code status, rehospitalization were considered and discussed.  I asked his sister/Monique Henderson, if Monique Henderson has talked about CODE STATUS in the past.  She shares that she has not discussed.  I encouraged her to talk about Monique Henderson with her sister, especially if she will help with decision-making.  Sister and on talk about the benefits of outpatient palliative Care services for continued goals of care discussions and support.  I shared that we will have to discuss this with Monique Henderson when she is able.  Discussed the importance of continued conversation with family and the Henderson providers regarding overall plan of care and treatment options, ensuring decisions are within the context of the patient's values and GOCs.  Questions and concerns were addressed.  The patient was encouraged to call with questions or concerns.  PMT will continue to support holistically.  Conference with attending, bedside nursing staff, transition of care team related to patient condition, needs, goals of care, disposition.   HCPOA  NEXT OF KIN -Monique Henderson names her son, Monique Henderson, as her healthcare surrogate.  However, there is no good phone number to reach Monique Henderson.  Call to sister, Monique Henderson.  We talk about healthcare surrogacy, assisting with decision-making.    SUMMARY OF RECOMMENDATIONS   At this point full scope/full code by default Time for confusion cough to clear for further goals of care discussion Would benefit from outpatient palliative services    Code Status/Advance Care Planning: Full code -by default.  Unable to have meaningful  discussions with patient.  Available family, sister, has not had these discussions/heard the wishes of Monique Henderson.  Symptom Management:  Per hospitalist/nephrologist, no additional needs at this time.  Palliative Prophylaxis:  Frequent Pain Assessment and Oral Care  Additional Recommendations (Limitations, Scope, Preferences): Full Scope Treatment  Psycho-social/Spiritual:  Desire for further Chaplaincy support:yes Additional Recommendations: Caregiving  Support/Resources  Prognosis:  Unable to determine, based on outcomes.  6 months or less would not be surprising based on  multiple hospitalizations (per son notes 11) in the last 6 months, chronic illness burden.  Discharge Planning:  To be determined, based on outcomes.  Historically Mrs. Dubow returns to her own home.       Primary Diagnoses: Present on Admission:  Acute metabolic encephalopathy  Hypertensive emergency  Malignant HTN with heart disease, w/o CHF, 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   I have reviewed the Henderson record, interviewed the patient and family, and examined the patient. The following aspects are pertinent.  Past Henderson History:  Diagnosis Date   AF (paroxysmal atrial fibrillation) ([place]) 05/29/2019   on Coumadin   Aortic atherosclerosis ([place]) 07/05/2019   Aortic dissection ([place]) 04/04/2019   s/p repair   Bone spur 2008   Right calcaneal foot spur   Breast cancer ([place]) 2004   Ductal carcinoma in situ of the left breast; S/P left partial mastectomy 02/26/2003; S/P re-excision of cranial and lateral margins11/18/2004.radiation   Cerebral thrombosis with cerebral infarction 05/22/2019   Chronic low back pain 06/22/2016   Chronic obstructive lung disease ([place]) 01/16/2017   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 Autumn Messing; S/P re-excision of superior and inferior margins 10/27/2012.    ESRD on hemodialysis ([place]) 05/29/2019   Essential  hypertension 09/16/2006   GERD 09/16/2006   Hepatitis C    treated and RNA confirmed not detectable 01/2017   Insomnia 03/14/2015   Malnutrition of moderate degree 05/19/2019   Non compliance with Henderson treatment 12/04/2017   Normocytic anemia    With thrombocytosis   Osteoarthritis    Right ureteral stone 2002   S/P lumbar spinal fusion 01/18/2014   S/P lumbar decompressive laminectomy, fusion, and plating for lumbar spinal stenosis on 05/27/2009 by Dr. Eustace Moore.  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 Eustace Moore.     Tobacco use disorder 04/19/2009   Uterine fibroid    Wears dentures    top   Social History   Socioeconomic History   Marital status: Single    Spouse name: Not on file   Number of children: 2   Years of education: Not on file   Highest education level: Not on file  Occupational History   Not on file  Tobacco Use   Smoking status: Former    Packs/day: 0.25    Years: 44.00    Total pack years: 11.00    Types: Cigarettes    Quit date: 05/24/2019    Years since quitting: 2.5   Smokeless tobacco: Never   Tobacco comments:    smoking less  Vaping Use   Vaping Use: Never used  Substance and Sexual Activity   Alcohol use: Not Currently    Alcohol/week: 2.0 standard drinks of alcohol    Types: 2 Cans of beer per week    Comment:  "couple beers a weekend"   Drug use: Not Currently    Types: Cocaine    Comment: "stopped back in the 1980's"   Sexual activity: Yes    Birth control/protection: Post-menopausal  Other Topics Concern   Not on file  Social History Narrative   Not on file   Social Determinants of Health   Financial Resource Strain: Not on file  Food Insecurity: Not on file  Transportation Needs: No Transportation Needs (06/06/2019)  PRAPARE - Hydrologist (Henderson): No    Lack of Transportation (Non-Henderson): No  Physical Activity: Not on file  Stress: Not on file  Social Connections: Not on file   Family History  Problem Relation Age of Onset   Colon cancer Mother 16   Hypertension Mother    Diabetes Sister 92   Hypertension Sister    Diabetes Brother    Hypertension Brother    Diabetes Brother    Hypertension Brother    Kidney disease Son        s/p renal transplant   Hypertension Son    Diabetes Son    Multiple sclerosis Son    Bone cancer Sister 45   Breast cancer Neg Hx    Cervical cancer Neg Hx    Scheduled Meds:  amLODipine  10 mg Oral Daily   apixaban  2.5 mg Oral BID   carvedilol  12.5 mg Oral BID WC   Chlorhexidine Gluconate Cloth  6 each Topical Q0600   hydrALAZINE  50 mg Oral BID   isosorbide mononitrate  30 mg Oral Daily   losartan  100 mg Oral Daily   sodium chloride flush  3 mL Intravenous Q12H   Continuous Infusions:  sodium chloride     PRN Meds:.sodium chloride, acetaminophen, albuterol, alteplase, docusate sodium, heparin, hydrALAZINE, lidocaine (PF), lidocaine-prilocaine, pentafluoroprop-tetrafluoroeth, polyethylene glycol, sodium chloride flush Medications Prior to Admission:  Prior to Admission medications   Medication Sig Start Date End Date Taking? Authorizing Provider  albuterol (VENTOLIN HFA) 108 (90 Base) MCG/ACT inhaler Inhale 2 puffs into the lungs every 6 (six) hours as needed for wheezing or shortness of breath. 07/30/20   Maximiano Coss, NP  amLODipine (NORVASC) 10 MG tablet Take 1 tablet (10 mg total) by mouth daily. 02/14/21   Maximiano Coss, NP  apixaban (ELIQUIS) 2.5 MG TABS tablet Take 1 tablet (2.5 mg total) by mouth 2 (two) times daily. 08/24/21   Shary Key, DO  carvedilol (COREG) 6.25 MG tablet Take 1 tablet (6.25 mg total) by mouth 2 (two) times daily with a meal. 12/08/21   Wouk, Ailene Rud, MD  cetirizine (ZYRTEC) 10 MG  tablet Take 10 mg by mouth daily as needed for allergies.    [provider]  cloNIDine (CATAPRES - DOSED IN MG/24 HR) 0.1 mg/24hr patch Place 1 patch (0.1 mg total) onto the skin every Saturday. Patient not taking: Reported on 10/29/2021 06/14/21   Caren Griffins, MD  hydrALAZINE (APRESOLINE) 25 MG tablet Take 25 mg by mouth 2 (two) times daily. 11/04/21   [provider]  hydrALAZINE (APRESOLINE) 50 MG tablet Take 1 tablet (50 mg total) by mouth 3 (three) times daily. 06/21/21 10/29/21  Nolberto Hanlon, MD  isosorbide mononitrate (IMDUR) 30 MG 24 hr tablet Take 1 tablet (30 mg total) by mouth daily. 08/17/21   Shary Key, DO  loperamide (IMODIUM) 2 MG capsule Take 2 mg by mouth as needed for diarrhea or loose stools. 07/15/20   [provider]  losartan (COZAAR) 100 MG tablet Take 1 tablet (100 mg total) by mouth daily. 02/14/21   Maximiano Coss, NP  mirtazapine (REMERON) 7.5 MG tablet Take 1 tablet (7.5 mg total) by mouth at bedtime. Patient taking differently: Take 7.5 mg by mouth at bedtime as needed (sleep). 07/15/20   Maximiano Coss, NP  [facility] 4 MG/0.1ML LIQD nasal spray kit Place 1 spray into the nose as needed for opioid reversal. If no response within  3 minutes then repeat once in the other nostril.  Call 911. 05/15/20   [provider]  pregabalin (LYRICA) 50 MG capsule Take 100 mg by mouth at bedtime. 05/07/21   [provider]  rosuvastatin (CRESTOR) 10 MG tablet Take 1 tablet (10 mg total) by mouth daily. Patient not taking: Reported on 10/29/2021 02/14/21 09/15/21  Maximiano Coss, NP  sevelamer carbonate (RENVELA) 2.4 g PACK Take 2.4 g by mouth 3 (three) times daily with meals. Patient not taking: Reported on 10/29/2021 05/15/21   [provider]  sucroferric oxyhydroxide (VELPHORO) 500 MG chewable tablet Chew 1,000 mg by mouth 3 (three) times daily with meals.    [provider]  torsemide (DEMADEX) 20 MG tablet Take 40 mg by  mouth daily.    [provider]  VITAMIN D PO Take 5,000 Units by mouth daily.    [provider]   Allergies  Allergen Reactions   Shrimp [Shellfish Allergy] Shortness Of Breath   Bactroban [Mupirocin] Other (See Comments)    "Sores in nose"   Vicodin [Hydrocodone-Acetaminophen] Itching and Nausea And Vomiting    This is patient's home medication   Eliquis [Apixaban] Itching    Spoke with patient, no rash.  Has tolerated on re-challenge.   Lisinopril Cough   Tylenol [Acetaminophen] Itching   Review of Systems  Unable to perform ROS: Other    Physical Exam Vitals and nursing note reviewed.     Vital Signs: BP 134/79   Pulse 63   Temp 98.6 F (37 C)   Resp 20   Ht 4' 10"  (1.473 m)   Wt 39.4 kg   SpO2 100%   BMI 18.15 kg/m  Pain Scale: 0-10   Pain Score: 0-No pain   SpO2: SpO2: 100 % O2 Device:SpO2: 100 % O2 Flow Rate: .   IO: Intake/output summary:  Intake/Output Summary (Last 24 hours) at 12/27/2021 1400 Last data filed at 12/27/2021 1345 Gross per 24 hour  Intake 358 ml  Output 800 ml  Net -442 ml    LBM: Last BM Date : 12/27/21 Baseline Weight: Weight: 42.3 kg Most recent weight: Weight: 39.4 kg     Palliative Assessment/Data:   Flowsheet Rows    Flowsheet Row Most Recent Value  Intake Tab   Referral Department Hospitalist  Unit at Time of Referral Other (Comment)  Palliative Care Primary Diagnosis Nephrology  Date Notified 12/25/21  Palliative Care Type New Palliative care  Reason for referral Clarify Goals of Care  Date of Admission 12/24/21  Date first seen by Palliative Care 12/27/21  # of days Palliative referral response time 2 Day(s)  # of days IP prior to Palliative referral 1  Clinical Assessment   Palliative Performance Scale Score 40%  Pain Max last 24 hours Not able to report  Pain Min Last 24 hours Not able to report  Dyspnea Max Last 24 Hours Not able to report  Dyspnea Min Last 24 hours Not able to report   Psychosocial & Spiritual Assessment   Palliative Care Outcomes        Time In: 1400 Time Out: 1515 Time Total: 75 minutes  Greater than 50%  of this time was spent counseling and coordinating care related to the above assessment and plan.  Signed by: Drue Novel, NP   Please contact Palliative Medicine Team phone at [phone] for questions and concerns.  For individual provider: See Shea Evans

## 2021-12-27 NOTE — Progress Notes (Signed)
[facility] Progress Note   Subjective:    Seen and examined patient on HD. On minimal UF. BP soft but stable. She denies SOB and CP. Palliative care scheduled to see her today.  Objective Vitals:   12/27/21 1210 12/27/21 1225 12/27/21 1239 12/27/21 1255  BP: 132/79 113/73 104/63 106/76  Pulse: 64 63 64 64  Resp: '16 15 18 16  '$ Temp:      TempSrc:      SpO2: 100% 100% 100% 100%  Weight:      Height:       Physical Exam General: Older female; cachetic appearing; NAD Neuro: Appears more awake today Heart: S1 and S2; No murmurs, gallops, or rubs Lungs: Clear anteriorly; No wheezing, rales, or rhonchi Abdomen: Soft and non-tender Extremities: No edema BLLE Dialysis Access: [facility]   Filed Weights   12/26/21 0345 12/27/21 0500 12/27/21 0945  Weight: 40.2 kg 39.2 kg 39.4 kg    Intake/Output Summary (Last 24 hours) at 12/27/2021 1312 Last data filed at 12/26/2021 2100 Gross per 24 hour  Intake 358 ml  Output --  Net 358 ml    Additional Objective Labs: Basic Metabolic Panel: Recent Labs  Lab 12/25/21 1357 12/26/21 0602 12/27/21 0054  NA 138 137 139  K 4.0 4.5 4.1  CL 96* 97* 96*  CO2 '23 25 26  '$ GLUCOSE 92 90 99  BUN 35* 50* 75*  CREATININE 6.22* 8.11* 10.32*  CALCIUM 9.3 9.5 9.1  PHOS 6.5* 8.8* 10.4*   Liver Function Tests: Recent Labs  Lab 12/24/21 1341 12/25/21 1357 12/26/21 0602 12/27/21 0054  AST 33  --   --   --   ALT 32  --   --   --   ALKPHOS 66  --   --   --   BILITOT 1.1  --   --   --   PROT 7.6  --   --   --   ALBUMIN 4.2 3.5 3.4* 3.3*   No results for input(s): "LIPASE", "AMYLASE" in the last 168 hours. CBC: Recent Labs  Lab 12/24/21 1341 12/24/21 1353 12/26/21 0600  WBC 4.8  --  4.5  NEUTROABS 4.2  --   --   HGB 11.4* 12.9 12.5  HCT 36.5 38.0 38.0  MCV 94.6  --  90.9  PLT 244  --  238   Blood Culture    Component Value Date/Time   SDES URINE, CLEAN CATCH 08/12/2021 1250   SPECREQUEST  08/12/2021 1250     NONE Performed at [facility] [address], [place], [place] [address]    CULT MULTIPLE SPECIES PRESENT, SUGGEST RECOLLECTION (A) 08/12/2021 1250   REPTSTATUS 08/15/2021 FINAL 08/12/2021 1250    Cardiac Enzymes: No results for input(s): "CKTOTAL", "CKMB", "CKMBINDEX", "TROPONINI" in the last 168 hours. CBG: Recent Labs  Lab 12/24/21 1315  GLUCAP 100*   Iron Studies: No results for input(s): "IRON", "TIBC", "TRANSFERRIN", "FERRITIN" in the last 72 hours. Lab Results  Component Value Date   INR 1.5 (H) 12/24/2021   INR 1.7 (H) 08/13/2021   INR 1.5 (H) 06/09/2021   Studies/Results: No results found.  Medications:  sodium chloride      amLODipine  10 mg Oral Daily   apixaban  2.5 mg Oral BID   carvedilol  12.5 mg Oral BID WC   Chlorhexidine Gluconate Cloth  6 each Topical Q0600   hydrALAZINE  50 mg Oral BID   isosorbide mononitrate  30 mg Oral Daily  losartan  100 mg Oral Daily   sodium chloride flush  3 mL Intravenous Q12H    Dialysis Orders: AF TTS  3h 36mn   400/1.5  44kg  2/2 bath P2  Hep none  [facility] TDC - last HD 7/22, post wt 45kg, usual UFG 1-2kg - last Hb 10.5 on 7/20 - mircera 50 mcg q2, last 7/18, due early Aug - iron sucrose 100 iv q hd thru 8/17 - doxercalciferol 2 ug iv tiw  Assessment/Plan: AMS - in setting of uncont HTN. Workup in progress. Also high suspicion for polysubstance abuse.  HTN / vol - w/ HTN urgency: IV Cleviprex off and transitioned to her home medications. Chronic noncompliance with antihypertensive medications. Does not look vol overloaded on exam.  ESRD - on HD TTS. Received HD early yesterday morning. K+ elevated but now coming down. On HD. Hyperkalemia -Resolving with HD.  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 Will hold IV vdra for now. Cont binders, add on phos.  GOC-patient has over 13 admissions/ED visits this year for non-adherence to medications/HD. Appears to have FFT, lack of  ability to care for herself. Palliative Care consulted to see patient to initiate goals of care discussions.    CTobie Poet NP [facility] 12/27/2021,1:12 PM  LOS: 3 days

## 2021-12-27 NOTE — Progress Notes (Signed)
PT Cancellation Note  Patient Details Name: Monique Henderson MRN: [id number] DOB: Feb 24, 1951   Cancelled Treatment:    Reason Eval/Treat Not Completed: Patient declined, no reason specified. Pt refuses PT, reporting therapy already came today. PT attempts to provide education on the difference between PT and OT, as well as the importance of frequent mobility, however pt is not accepting of this. PT will follow up as time allows.   Zenaida Niece 12/27/2021, 4:00 PM

## 2021-12-27 NOTE — Progress Notes (Signed)
Post HD Note  Received patient in bed, alert and oriented. Informed consent signed and in chart.    Time tx initiated: 0955 Time tx completed: 1325  HD treatment completed. Patient tolerated well. Fistula/Graft/HD catheter without signs and symptoms of complications. Patient transported back to the room, alert and orient and in no acute distress. Report given to bedside RN.   Total UF removed: 800 Medication given: none Post HD VS:134/79, 64, 100% 21   Post HD weight: 38.5kg   Christianne Dolin Dialysis Nurse

## 2021-12-27 NOTE — Evaluation (Signed)
Occupational Therapy Evaluation Patient Details Name: Monique Henderson MRN: [id number] DOB: March 02, 1951 Today's Date: 12/27/2021   History of Present Illness Pt is a 71 y.o. female presents to [facility] hospital on 12/24/2021 with AMS, with hypertension. PMH includes ESRD, HTN, PAF, polysubstance abuse, aortic aneurysm dissection s/p repair.   Clinical Impression   Pt reports they live with significant other and can assist 24/7. Pt reports they have a chair lift in the home but at this time it is not working but can use rail. Pt at this time was able to complete bed mobility with increase in time, UE ADLS post set up  to min guard, LE ADLS with min assist and toilet hygiene with min guard. Pt currently with functional limitations due to the deficits listed below (see OT Problem List).  Pt will benefit from skilled OT to increase their safety and independence with ADL and functional mobility for ADL to facilitate discharge to venue listed below.        Recommendations for follow up therapy are one component of a multi-disciplinary discharge planning process, led by the attending physician.  Recommendations may be updated based on patient status, additional functional criteria and insurance authorization.   Follow Up Recommendations  Home health OT    Assistance Recommended at Discharge Frequent or constant Supervision/Assistance  Patient can return home with the following A little help with walking and/or transfers;A little help with bathing/dressing/bathroom;Assistance with cooking/housework;Direct supervision/assist for medications management;Direct supervision/assist for financial management;Assist for transportation    Functional Status Assessment  Patient has had a recent decline in their functional status and demonstrates the ability to make significant improvements in function in a reasonable and predictable amount of time.  Equipment Recommendations  None recommended by OT     Recommendations for Other Services       Precautions / Restrictions Precautions Precautions: Fall Restrictions Weight Bearing Restrictions: No      Mobility Bed Mobility Overal bed mobility: Needs Assistance Bed Mobility: Supine to Sit, Sit to Supine     Supine to sit: Min guard, HOB elevated Sit to supine: Min guard, HOB elevated        Transfers Overall transfer level: Needs assistance Equipment used: Rolling walker (2 wheels) Transfers: Sit to/from Stand Sit to Stand: Min assist                  Balance Overall balance assessment: Needs assistance Sitting-balance support: No upper extremity supported Sitting balance-Leahy Scale: Good     Standing balance support: Bilateral upper extremity supported, No upper extremity supported Standing balance-Leahy Scale: Fair                             ADL either performed or assessed with clinical judgement   ADL Overall ADL's : Needs assistance/impaired Eating/Feeding: Set up;Sitting   Grooming: Wash/dry hands;Wash/dry face;Set up;Cueing for safety;Cueing for sequencing;Sitting   Upper Body Bathing: Min guard;Cueing for safety;Cueing for sequencing;Sitting   Lower Body Bathing: Minimal assistance;Cueing for safety;Cueing for sequencing;Sit to/from stand   Upper Body Dressing : Min guard;Cueing for safety;Cueing for sequencing;Sitting   Lower Body Dressing: Minimal assistance;Cueing for safety;Cueing for sequencing;Sit to/from stand   Toilet Transfer: Min guard;Cueing for safety;Cueing for sequencing;Rolling walker (2 wheels)   Toileting- Water quality scientist and Hygiene: Min guard;Cueing for safety;Cueing for sequencing;Sitting/lateral lean       Functional mobility during ADLs: Min guard;Rolling walker (2 wheels);Cueing for sequencing;Cueing for safety  Vision         Perception     Praxis      Pertinent Vitals/Pain       Hand Dominance Right   Extremity/Trunk  Assessment Upper Extremity Assessment Upper Extremity Assessment: Generalized weakness   Lower Extremity Assessment Lower Extremity Assessment: Defer to PT evaluation       Communication Communication Communication: No difficulties   Cognition Arousal/Alertness: Awake/alert Behavior During Therapy: Agitated Overall Cognitive Status: No family/caregiver present to determine baseline cognitive functioning                                 General Comments: Pt was agitaed at the begning of session but more in regards to what was going on with HD     General Comments       Exercises     Shoulder Instructions      Home Living Family/patient expects to be discharged to:: Private residence Living Arrangements: Spouse/significant other Available Help at Discharge: Family;Available 24 hours/day Type of Home: Apartment Home Access: Level entry     Home Layout: Two level Alternate Level Stairs-Number of Steps: stair lift up to bedroom however at this time is not working but has a rail to enter/exit   Bathroom Shower/Tub: Teacher, early years/pre: Standard     Home Equipment: Conservation officer, nature (2 wheels);Cane - single point;BSC/3in1   Additional Comments: fiance stays with pt      Prior Functioning/Environment Prior Level of Function : Independent/Modified Independent             Mobility Comments: use of walker          OT Problem List: Decreased strength;Decreased activity tolerance;Impaired balance (sitting and/or standing);Decreased safety awareness;Decreased knowledge of use of DME or AE;Cardiopulmonary status limiting activity      OT Treatment/Interventions: Self-care/ADL training;Therapeutic exercise;DME and/or AE instruction;Therapeutic activities;Patient/family education;Balance training    OT Goals(Current goals can be found in the care plan section) Acute Rehab OT Goals Patient Stated Goal: to use the bathroom OT Goal Formulation:  With patient Time For Goal Achievement: 01/10/22 Potential to Achieve Goals: Good ADL Goals Pt Will Perform Upper Body Bathing: Independently;sitting;standing Pt Will Perform Lower Body Bathing: with modified independence;sit to/from stand;with adaptive equipment Pt Will Transfer to Toilet: with modified independence;ambulating;regular height toilet;grab bars Pt Will Perform Tub/Shower Transfer: with modified independence;ambulating;shower seat;grab bars;rolling walker  OT Frequency: Min 2X/week    Co-evaluation              AM-PAC OT "6 Clicks" Daily Activity     Outcome Measure Help from another person eating meals?: None Help from another person taking care of personal grooming?: A Little Help from another person toileting, which includes using toliet, bedpan, or urinal?: A Little Help from another person bathing (including washing, rinsing, drying)?: A Little Help from another person to put on and taking off regular upper body clothing?: A Little Help from another person to put on and taking off regular lower body clothing?: A Little 6 Click Score: 19   End of Session Equipment Utilized During Treatment: Gait belt;Rolling walker (2 wheels) Nurse Communication: Mobility status  Activity Tolerance: Patient tolerated treatment well Patient left: in bed;with call bell/phone within reach;with bed alarm set  OT Visit Diagnosis: Unsteadiness on feet (R26.81);Other abnormalities of gait and mobility (R26.89);Muscle weakness (generalized) (M62.81)  Time: 0829-0902 OT Time Calculation (min): 33 min Charges:  OT General Charges $OT Visit: 1 Visit OT Evaluation $OT Eval Low Complexity: 1 Low OT Treatments $Self Care/Home Management : 8-22 mins  Joeseph Amor OTR/L  Acute Rehab Services  [phone] office number [phone] pager number   Joeseph Amor 12/27/2021, 10:26 AM

## 2021-12-27 NOTE — Progress Notes (Signed)
[facility] PROGRESS NOTE    Progress Note  Monique Henderson  CZY:[id number] DOB: [birth year] DOA: 12/24/2021 PCP: Merryl Hacker, No     Brief Narrative:   Monique Henderson is an 71 y.o. female past medical history of end-stage renal disease on dialysis Tuesday Thursdays an Saturdays, essential hypertension, paroxysmal atrial fibrillation, polysubstance abuse, recent history of cocaine use, history of aortic aneurysm and dissection s/p repair presents to the ED with altered mental status, she was found to be in hypertensive urgency with blood pressure over 200 started on IV drip, underwent hemodialysis overnight and hemodynamically she stabilized.   Assessment/Plan:   Acute metabolic encephalopathy: With a recent history of cocaine use.  This patient for polysubstance abuse. UDS is pending. No evidence of factious etiology no focal deficits, CT of the head showed no acute findings. Holding Lyrica, Remeron which can contribute to encephalopathy. Also in the differential include hypertensive urgency contributing to her encephalopathy.  Hypertensive emergency Started back on her hemodialysis.  Started on IV Cleviprex on admission now has been transitioned off and on her home regimen. Titrating antihypertensive medications as tolerated. Systemic blood pressure slowly improved. Continue amlodipine, Coreg, losartan, hydralazine and Imdur  End-stage renal disease on hemodialysis/hyperkalemia: Renal was consulted she underwent emergent dialysis.  For HD today. We will continue dialysis Tuesday Thursdays and Saturdays.  Paroxysmal atrial fibrillation: Continue Coreg and Eliquis she seems to be rate controlled.  Goals of care: She has had over 9 admissions this year most of them due to not adherence to her hemodialysis treatment. Definitely she lacks the ability to care for self. Have a long discussion with her about meeting with palliative care. Consult primary care  DVT prophylaxis:  lovenox Family Communication:none Status is: Inpatient Remains inpatient appropriate because: Acute metabolic encephalopathy and hypertensive urgency in the setting of polysubstance abuse    Code Status:     Code Status Orders  (From admission, onward)           Start     Ordered   12/24/21 1616  Full code  Continuous        12/24/21 1616           Code Status History     Date Active Date Inactive Code Status Order ID Comments User Context   12/24/2021 1523 12/24/2021 1616 Full Code [id number]  Candee Furbish, MD ED   12/07/2021 2310 12/08/2021 2136 Full Code [id number]  Etta Quill, DO ED   10/29/2021 1126 10/31/2021 0030 Full Code [id number]  Norval Morton, MD ED   09/15/2021 0156 09/16/2021 0001 Full Code [id number]  Erskine Emery, MD ED   08/23/2021 0858 08/24/2021 2021 Full Code [id number]  Wells Guiles, DO ED   08/12/2021 1702 08/17/2021 0101 Full Code [id number]  France Ravens, MD ED   06/19/2021 0725 06/22/2021 0049 Full Code [id number]  Phillips Grout, MD ED   06/05/2021 0144 06/09/2021 2022 Full Code [id number]  Rise Patience, MD ED   12/23/2020 1429 12/24/2020 2027 Full Code [id number]  Karmen Bongo, MD ED   09/21/2020 2313 09/27/2020 1945 Full Code [id number]  Vianne Bulls, MD ED   08/28/2019 0317 08/30/2019 2326 Full Code [id number]  Vianne Bulls, MD ED   05/18/2019 2053 05/24/2019 1953 Full Code [id number]  Jennye Boroughs, MD Inpatient   04/07/2019 1721 04/23/2019 1432 Full Code [id number]  Wonda Olds, MD Inpatient   04/04/2019 1402 04/07/2019 1721 Full Code [id number]  Wonda Olds, MD Inpatient  01/18/2014 1123 01/19/2014 1427 Full Code [id number]  Eustace Moore, MD Inpatient   03/23/2013 1906 03/24/2013 1643 Full Code [id number]  Eustace Moore, MD Inpatient   02/27/2013 1736 03/01/2013 1937 Full Code [id number]  Otho Bellows, MD Inpatient         IV Access:   Peripheral IV   Procedures and diagnostic studies:   No results found.   Medical  Consultants:   None.   Subjective:    Monique Henderson not in a good mood this morning relating her breakfast bad and her Diet is restricted  Objective:    Vitals:   12/27/21 0100 12/27/21 0400 12/27/21 0500 12/27/21 0556  BP: 130/78 117/79 123/83 (!) 149/92  Pulse:      Resp:   19 17  Temp:   (!) 96.7 F (35.9 C)   TempSrc:   Oral   SpO2:      Weight:   39.2 kg   Height:       SpO2: 97 %   Intake/Output Summary (Last 24 hours) at 12/27/2021 2542 Last data filed at 12/26/2021 2100 Gross per 24 hour  Intake 361 ml  Output --  Net 361 ml   Filed Weights   12/25/21 0727 12/26/21 0345 12/27/21 0500  Weight: 40.7 kg 40.2 kg 39.2 kg    Exam: General exam: In no acute distress. Respiratory system: Good air movement and clear to auscultation. Cardiovascular system: S1 & S2 heard, RRR. No JVD. Gastrointestinal system: Abdomen is nondistended, soft and nontender.  Extremities: No pedal edema. Skin: No rashes, lesions or ulcers Psychiatry: Judgement and insight appear normal. Mood & affect appropriate.    Data Reviewed:    Labs: Basic Metabolic Panel: Recent Labs  Lab 12/24/21 1341 12/24/21 1353 12/25/21 0145 12/25/21 1357 12/26/21 0602 12/27/21 0054  NA 149* 145 149* 138 137 139  K 5.9* 5.6* 5.0 4.0 4.5 4.1  CL 104 108 106 96* 97* 96*  CO2 21*  --  '24 23 25 26  '$ GLUCOSE 104* 105* 91 92 90 99  BUN 93* 89* 112* 35* 50* 75*  CREATININE 13.37* 14.70* 14.47* 6.22* 8.11* 10.32*  CALCIUM 10.1  --  9.5 9.3 9.5 9.1  PHOS 11.1*  --   --  6.5* 8.8* 10.4*   GFR Estimated Creatinine Clearance: 3.2 mL/min (A) (by C-G formula based on SCr of 10.32 mg/dL (H)). Liver Function Tests: Recent Labs  Lab 12/24/21 1341 12/25/21 1357 12/26/21 0602 12/27/21 0054  AST 33  --   --   --   ALT 32  --   --   --   ALKPHOS 66  --   --   --   BILITOT 1.1  --   --   --   PROT 7.6  --   --   --   ALBUMIN 4.2 3.5 3.4* 3.3*   No results for input(s): "LIPASE", "AMYLASE" in  the last 168 hours. Recent Labs  Lab 12/24/21 1341  AMMONIA 26   Coagulation profile Recent Labs  Lab 12/24/21 1341  INR 1.5*   COVID-19 Labs  No results for input(s): "DDIMER", "FERRITIN", "LDH", "CRP" in the last 72 hours.  Lab Results  Component Value Date   SARSCOV2NAA NEGATIVE 12/24/2021   SARSCOV2NAA NEGATIVE 10/29/2021   SARSCOV2NAA NEGATIVE 09/15/2021   [place] NEGATIVE 08/23/2021    CBC: Recent Labs  Lab 12/24/21 1341 12/24/21 1353 12/26/21 0600  WBC 4.8  --  4.5  NEUTROABS 4.2  --   --  HGB 11.4* 12.9 12.5  HCT 36.5 38.0 38.0  MCV 94.6  --  90.9  PLT 244  --  238   Cardiac Enzymes: No results for input(s): "CKTOTAL", "CKMB", "CKMBINDEX", "TROPONINI" in the last 168 hours. BNP (last 3 results) No results for input(s): "PROBNP" in the last 8760 hours. CBG: Recent Labs  Lab 12/24/21 1315  GLUCAP 100*   D-Dimer: No results for input(s): "DDIMER" in the last 72 hours. Hgb A1c: No results for input(s): "HGBA1C" in the last 72 hours. Lipid Profile: No results for input(s): "CHOL", "HDL", "LDLCALC", "TRIG", "CHOLHDL", "LDLDIRECT" in the last 72 hours. Thyroid function studies: No results for input(s): "TSH", "T4TOTAL", "T3FREE", "THYROIDAB" in the last 72 hours.  Invalid input(s): "FREET3" Anemia work up: No results for input(s): "VITAMINB12", "FOLATE", "FERRITIN", "TIBC", "IRON", "RETICCTPCT" in the last 72 hours. Sepsis Labs: Recent Labs  Lab 12/24/21 1341 12/26/21 0600  WBC 4.8 4.5   Microbiology Recent Results (from the past 240 hour(s))  Resp Panel by RT-PCR (Flu A&B, Covid) Anterior Nasal Swab     Status: None   Collection Time: 12/24/21  1:34 PM   Specimen: Anterior Nasal Swab  Result Value Ref Range Status   SARS Coronavirus 2 by RT PCR NEGATIVE NEGATIVE Final    Comment: (NOTE) SARS-CoV-2 target nucleic acids are NOT DETECTED.  The SARS-CoV-2 RNA is generally detectable in upper respiratory specimens during the acute phase  of infection. The lowest concentration of SARS-CoV-2 viral copies this assay can detect is 138 copies/mL. A negative result does not preclude SARS-Cov-2 infection and should not be used as the sole basis for treatment or other patient management decisions. A negative result may occur with  improper specimen collection/handling, submission of specimen other than nasopharyngeal swab, presence of viral mutation(s) within the areas targeted by this assay, and inadequate number of viral copies(<138 copies/mL). A negative result must be combined with clinical observations, patient history, and epidemiological information. The expected result is Negative.  Fact Sheet for Patients:  EntrepreneurPulse.com.au  Fact Sheet for Healthcare Providers:  IncredibleEmployment.be  This test is no t yet approved or cleared by the [place] FDA and  has been authorized for detection and/or diagnosis of SARS-CoV-2 by FDA under an Emergency Use Authorization (EUA). This EUA will remain  in effect (meaning this test can be used) for the duration of the COVID-19 declaration under Section 564(b)(1) of the Act, 21 U.S.C.section 360bbb-3(b)(1), unless the authorization is terminated  or revoked sooner.       Influenza A by PCR NEGATIVE NEGATIVE Final   Influenza B by PCR NEGATIVE NEGATIVE Final    Comment: (NOTE) The Xpert Xpress SARS-CoV-2/FLU/RSV plus assay is intended as an aid in the diagnosis of influenza from Nasopharyngeal swab specimens and should not be used as a sole basis for treatment. Nasal washings and aspirates are unacceptable for Xpert Xpress SARS-CoV-2/FLU/RSV testing.  Fact Sheet for Patients: EntrepreneurPulse.com.au  Fact Sheet for Healthcare Providers: IncredibleEmployment.be  This test is not yet approved or cleared by the [place] FDA and has been authorized for detection and/or diagnosis of  SARS-CoV-2 by FDA under an Emergency Use Authorization (EUA). This EUA will remain in effect (meaning this test can be used) for the duration of the COVID-19 declaration under Section 564(b)(1) of the Act, 21 U.S.C. section 360bbb-3(b)(1), unless the authorization is terminated or revoked.  Performed at [facility], [place] [address], [place], [place] [address]      Medications:    amLODipine  10 mg Oral Daily   apixaban  2.5 mg Oral BID   carvedilol  12.5 mg Oral BID WC   Chlorhexidine Gluconate Cloth  6 each Topical Q0600   hydrALAZINE  50 mg Oral BID   isosorbide mononitrate  30 mg Oral Daily   losartan  100 mg Oral Daily   sodium chloride flush  3 mL Intravenous Q12H   Continuous Infusions:  sodium chloride        LOS: 3 days   Charlynne Cousins  [facility]  12/27/2021, 8:22 AM

## 2021-12-28 DIAGNOSIS — E875 Hyperkalemia: Secondary | ICD-10-CM | POA: Diagnosis not present

## 2021-12-28 DIAGNOSIS — I161 Hypertensive emergency: Secondary | ICD-10-CM | POA: Diagnosis not present

## 2021-12-28 DIAGNOSIS — N186 End stage renal disease: Secondary | ICD-10-CM | POA: Diagnosis not present

## 2021-12-28 DIAGNOSIS — G9341 Metabolic encephalopathy: Secondary | ICD-10-CM | POA: Diagnosis not present

## 2021-12-28 NOTE — Progress Notes (Signed)
[facility] Progress Note   Subjective:    Seen and examined patient at bedside. She appears more awake today. She reports wanting to go home but concerned she no longer can take care of herself. Tolerated yesterday's HD with minimal UF. Seen by Palliative Care yesterday.  Objective Vitals:   12/28/21 0035 12/28/21 0351 12/28/21 0725 12/28/21 1041  BP: 112/74 130/86 119/71 (!) 99/58  Pulse: 67 67 71 72  Resp: '16 17 18 19  '$ Temp: 98.7 F (37.1 C) 98.8 F (37.1 C) 98.9 F (37.2 C)   TempSrc: Oral Oral Oral   SpO2: 96% 100% 100% 98%  Weight: 38.2 kg     Height:       Physical Exam General: Older female; cachetic appearing; NAD Neuro: Appears more awake today Heart: S1 and S2; No murmurs, gallops, or rubs Lungs: Clear anteriorly; No wheezing, rales, or rhonchi Abdomen: Soft and non-tender Extremities: No edema BLLE Dialysis Access: [facility]    Filed Weights   12/27/21 0500 12/27/21 0945 12/28/21 0035  Weight: 39.2 kg 39.4 kg 38.2 kg    Intake/Output Summary (Last 24 hours) at 12/28/2021 1254 Last data filed at 12/28/2021 1229 Gross per 24 hour  Intake 596 ml  Output 800 ml  Net -204 ml    Additional Objective Labs: Basic Metabolic Panel: Recent Labs  Lab 12/26/21 0602 12/27/21 0054 12/28/21 0443  NA 137 139 136  K 4.5 4.1 3.9  CL 97* 96* 95*  CO2 '25 26 27  '$ GLUCOSE 90 99 112*  BUN 50* 75* 50*  CREATININE 8.11* 10.32* 6.83*  CALCIUM 9.5 9.1 9.3  PHOS 8.8* 10.4* 7.3*   Liver Function Tests: Recent Labs  Lab 12/24/21 1341 12/25/21 1357 12/26/21 0602 12/27/21 0054 12/28/21 0443  AST 33  --   --   --   --   ALT 32  --   --   --   --   ALKPHOS 66  --   --   --   --   BILITOT 1.1  --   --   --   --   PROT 7.6  --   --   --   --   ALBUMIN 4.2   < > 3.4* 3.3* 3.4*   < > = values in this interval not displayed.   No results for input(s): "LIPASE", "AMYLASE" in the last 168 hours. CBC: Recent Labs  Lab 12/24/21 1341 12/24/21 1353  12/26/21 0600  WBC 4.8  --  4.5  NEUTROABS 4.2  --   --   HGB 11.4* 12.9 12.5  HCT 36.5 38.0 38.0  MCV 94.6  --  90.9  PLT 244  --  238   Blood Culture    Component Value Date/Time   SDES URINE, CLEAN CATCH 08/12/2021 1250   SPECREQUEST  08/12/2021 1250    NONE Performed at [facility] [address] [place], [place] [address]    CULT MULTIPLE SPECIES PRESENT, SUGGEST RECOLLECTION (A) 08/12/2021 1250   REPTSTATUS 08/15/2021 FINAL 08/12/2021 1250    Cardiac Enzymes: No results for input(s): "CKTOTAL", "CKMB", "CKMBINDEX", "TROPONINI" in the last 168 hours. CBG: Recent Labs  Lab 12/24/21 1315  GLUCAP 100*   Iron Studies: No results for input(s): "IRON", "TIBC", "TRANSFERRIN", "FERRITIN" in the last 72 hours. Lab Results  Component Value Date   INR 1.5 (H) 12/24/2021   INR 1.7 (H) 08/13/2021   INR 1.5 (H) 06/09/2021   Studies/Results: No results found.  Medications:  sodium chloride      amLODipine  10 mg Oral Daily   apixaban  2.5 mg Oral BID   carvedilol  12.5 mg Oral BID WC   Chlorhexidine Gluconate Cloth  6 each Topical Q0600   hydrALAZINE  50 mg Oral BID   isosorbide mononitrate  30 mg Oral Daily   losartan  100 mg Oral Daily   sodium chloride flush  3 mL Intravenous Q12H    Dialysis Orders: AF TTS  3h 19mn   400/1.5  44kg  2/2 bath P2  Hep none  [facility]DC - last HD 7/22, post wt 45kg, usual UFG 1-2kg - last Hb 10.5 on 7/20 - mircera 50 mcg q2, last 7/18, due early Aug - iron sucrose 100 iv q hd thru 8/17 - doxercalciferol 2 ug iv tiw  Assessment/Plan: AMS - in setting of uncont HTN. Workup in progress. Also high suspicion for polysubstance abuse.  HTN / vol - w/ HTN urgency: IV Cleviprex off and transitioned to her home medications. Chronic noncompliance with antihypertensive medications. Does not look vol overloaded on exam.  ESRD - on HD TTS. Received HD early yesterday morning. K+ elevated but now coming down. Next HD 8/1. Hyperkalemia  -Resolving with HD.  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 Will hold IV vdra for now. Cont binders, add on phos.  GOC-patient has over 13 admissions/ED visits this year for non-adherence to medications/HD. Appears to have FFT, lack of ability to care for herself. Seen by Palliative Care yesterday. Reviewed note: awaiting family meeting to determine goals of care. Patient remains Full Code at this time. I do agree to continue palliative care services in outpatient setting.  CTobie Poet NP [facility] 12/28/2021,12:54 PM  LOS: 4 days

## 2021-12-28 NOTE — Progress Notes (Signed)
[facility] PROGRESS NOTE    Progress Note  DIMPLES PROBUS  OVZ:[id number] DOB: 1950-11-03 DOA: 12/24/2021 PCP: Merryl Hacker, No     Brief Narrative:   Monique Henderson is an 71 y.o. female past medical history of end-stage renal disease on dialysis Tuesday Thursdays an Saturdays, essential hypertension, paroxysmal atrial fibrillation, polysubstance abuse, recent history of cocaine use, history of aortic aneurysm and dissection s/p repair presents to the ED with altered mental status, she was found to be in hypertensive urgency with blood pressure over 200 started on IV drip, underwent hemodialysis overnight and hemodynamically she stabilized.   Assessment/Plan:   Acute metabolic encephalopathy: With a recent history of cocaine use.  This patient for polysubstance abuse. UDS is pending. No evidence of factious etiology no focal deficits, CT of the head showed no acute findings. Holding Lyrica, Remeron which can contribute to encephalopathy. Also in the differential include hypertensive urgency contributing to her encephalopathy.  Hypertensive emergency Started back on her hemodialysis.  Started on IV Cleviprex on admission now has been transitioned off and on her home regimen. Titrating antihypertensive medications as tolerated. Systemic blood pressure slowly improved. Continue amlodipine, Coreg, losartan, hydralazine and Imdur  End-stage renal disease on hemodialysis/hyperkalemia: Renal was consulted she underwent emergent dialysis.  For HD today. We will continue dialysis Tuesday Thursdays and Saturdays.  Paroxysmal atrial fibrillation: Continue Coreg and Eliquis she seems to be rate controlled.  Goals of care: She has had over 9 admissions this year most of them due to not adherence to her hemodialysis treatment. Awaiting palliative care meeting with family to discuss goals of care and treatment moving forward.  DVT prophylaxis: lovenox Family Communication:none Status  is: Inpatient Remains inpatient appropriate because: Acute metabolic encephalopathy and hypertensive urgency in the setting of polysubstance abuse    Code Status:     Code Status Orders  (From admission, onward)           Start     Ordered   12/24/21 1616  Full code  Continuous        12/24/21 1616           Code Status History     Date Active Date Inactive Code Status Order ID Comments User Context   12/24/2021 1523 12/24/2021 1616 Full Code [id number]  Candee Furbish, MD ED   12/07/2021 2310 12/08/2021 2136 Full Code [id number]  Etta Quill, DO ED   10/29/2021 1126 10/31/2021 0030 Full Code [id number]  Norval Morton, MD ED   09/15/2021 0156 09/16/2021 0001 Full Code [id number]  Erskine Emery, MD ED   08/23/2021 0858 08/24/2021 2021 Full Code [id number]  Wells Guiles, DO ED   08/12/2021 1702 08/17/2021 0101 Full Code [id number]  France Ravens, MD ED   06/19/2021 0725 06/22/2021 0049 Full Code [id number]  Phillips Grout, MD ED   06/05/2021 0144 06/09/2021 2022 Full Code [id number]  Rise Patience, MD ED   12/23/2020 1429 12/24/2020 2027 Full Code [id number]  Karmen Bongo, MD ED   09/21/2020 2313 09/27/2020 1945 Full Code [id number]  Vianne Bulls, MD ED   08/28/2019 0317 08/30/2019 2326 Full Code [id number]  Vianne Bulls, MD ED   05/18/2019 2053 05/24/2019 1953 Full Code [id number]  Jennye Boroughs, MD Inpatient   04/07/2019 1721 04/23/2019 1432 Full Code [id number]  Wonda Olds, MD Inpatient   04/04/2019 1402 04/07/2019 1721 Full Code [id number]  Wonda Olds, MD Inpatient   01/18/2014 1123 01/19/2014 1427 Full Code [id number]  Eustace Moore, MD Inpatient   03/23/2013 1906 03/24/2013 1643 Full Code [id number]  Eustace Moore, MD Inpatient   02/27/2013 1736 03/01/2013 1937 Full Code [id number]  Otho Bellows, MD Inpatient         IV Access:   Peripheral IV   Procedures and diagnostic studies:   No results found.   Medical Consultants:   None.   Subjective:     Monique Henderson is sleeping this morning  Objective:    Vitals:   12/27/21 2012 12/28/21 0035 12/28/21 0351 12/28/21 0725  BP: 113/72 112/74 130/86 119/71  Pulse: 71 67 67 71  Resp: '20 16 17 18  '$ Temp: 98.7 F (37.1 C) 98.7 F (37.1 C) 98.8 F (37.1 C) 98.9 F (37.2 C)  TempSrc: Oral Oral Oral Oral  SpO2:  96% 100% 100%  Weight:  38.2 kg    Height:       SpO2: 100 %   Intake/Output Summary (Last 24 hours) at 12/28/2021 1003 Last data filed at 12/28/2021 0900 Gross per 24 hour  Intake 478 ml  Output 800 ml  Net -322 ml    Filed Weights   12/27/21 0500 12/27/21 0945 12/28/21 0035  Weight: 39.2 kg 39.4 kg 38.2 kg    Exam: General exam: In no acute distress. Respiratory system: Good air movement and clear to auscultation. Cardiovascular system: S1 & S2 heard, RRR. No JVD. Gastrointestinal system: Abdomen is nondistended, soft and nontender.  Extremities: No pedal edema. Skin: No rashes, lesions or ulcers  Data Reviewed:    Labs: Basic Metabolic Panel: Recent Labs  Lab 12/24/21 1341 12/24/21 1353 12/25/21 0145 12/25/21 1357 12/26/21 0602 12/27/21 0054 12/28/21 0443  NA 149*   < > 149* 138 137 139 136  K 5.9*   < > 5.0 4.0 4.5 4.1 3.9  CL 104   < > 106 96* 97* 96* 95*  CO2 21*  --  '24 23 25 26 27  '$ GLUCOSE 104*   < > 91 92 90 99 112*  BUN 93*   < > 112* 35* 50* 75* 50*  CREATININE 13.37*   < > 14.47* 6.22* 8.11* 10.32* 6.83*  CALCIUM 10.1  --  9.5 9.3 9.5 9.1 9.3  PHOS 11.1*  --   --  6.5* 8.8* 10.4* 7.3*   < > = values in this interval not displayed.    GFR Estimated Creatinine Clearance: 4.7 mL/min (A) (by C-G formula based on SCr of 6.83 mg/dL (H)). Liver Function Tests: Recent Labs  Lab 12/24/21 1341 12/25/21 1357 12/26/21 0602 12/27/21 0054 12/28/21 0443  AST 33  --   --   --   --   ALT 32  --   --   --   --   ALKPHOS 66  --   --   --   --   BILITOT 1.1  --   --   --   --   PROT 7.6  --   --   --   --   ALBUMIN 4.2 3.5 3.4* 3.3*  3.4*    No results for input(s): "LIPASE", "AMYLASE" in the last 168 hours. Recent Labs  Lab 12/24/21 1341  AMMONIA 26    Coagulation profile Recent Labs  Lab 12/24/21 1341  INR 1.5*    COVID-19 Labs  No results for input(s): "DDIMER", "FERRITIN", "LDH", "CRP" in the last 72 hours.  Lab Results  Component Value Date   [place] NEGATIVE 12/24/2021   [place]  NEGATIVE 10/29/2021   SARSCOV2NAA NEGATIVE 09/15/2021   [place] NEGATIVE 08/23/2021    CBC: Recent Labs  Lab 12/24/21 1341 12/24/21 1353 12/26/21 0600  WBC 4.8  --  4.5  NEUTROABS 4.2  --   --   HGB 11.4* 12.9 12.5  HCT 36.5 38.0 38.0  MCV 94.6  --  90.9  PLT 244  --  238    Cardiac Enzymes: No results for input(s): "CKTOTAL", "CKMB", "CKMBINDEX", "TROPONINI" in the last 168 hours. BNP (last 3 results) No results for input(s): "PROBNP" in the last 8760 hours. CBG: Recent Labs  Lab 12/24/21 1315  GLUCAP 100*    D-Dimer: No results for input(s): "DDIMER" in the last 72 hours. Hgb A1c: No results for input(s): "HGBA1C" in the last 72 hours. Lipid Profile: No results for input(s): "CHOL", "HDL", "LDLCALC", "TRIG", "CHOLHDL", "LDLDIRECT" in the last 72 hours. Thyroid function studies: No results for input(s): "TSH", "T4TOTAL", "T3FREE", "THYROIDAB" in the last 72 hours.  Invalid input(s): "FREET3" Anemia work up: No results for input(s): "VITAMINB12", "FOLATE", "FERRITIN", "TIBC", "IRON", "RETICCTPCT" in the last 72 hours. Sepsis Labs: Recent Labs  Lab 12/24/21 1341 12/26/21 0600  WBC 4.8 4.5    Microbiology Recent Results (from the past 240 hour(s))  Resp Panel by RT-PCR (Flu A&B, Covid) Anterior Nasal Swab     Status: None   Collection Time: 12/24/21  1:34 PM   Specimen: Anterior Nasal Swab  Result Value Ref Range Status   SARS Coronavirus 2 by RT PCR NEGATIVE NEGATIVE Final    Comment: (NOTE) SARS-CoV-2 target nucleic acids are NOT DETECTED.  The SARS-CoV-2 RNA is  generally detectable in upper respiratory specimens during the acute phase of infection. The lowest concentration of SARS-CoV-2 viral copies this assay can detect is 138 copies/mL. A negative result does not preclude SARS-Cov-2 infection and should not be used as the sole basis for treatment or other patient management decisions. A negative result may occur with  improper specimen collection/handling, submission of specimen other than nasopharyngeal swab, presence of viral mutation(s) within the areas targeted by this assay, and inadequate number of viral copies(<138 copies/mL). A negative result must be combined with clinical observations, patient history, and epidemiological information. The expected result is Negative.  Fact Sheet for Patients:  EntrepreneurPulse.com.au  Fact Sheet for Healthcare Providers:  IncredibleEmployment.be  This test is no t yet approved or cleared by the [place] FDA and  has been authorized for detection and/or diagnosis of SARS-CoV-2 by FDA under an Emergency Use Authorization (EUA). This EUA will remain  in effect (meaning this test can be used) for the duration of the COVID-19 declaration under Section 564(b)(1) of the Act, 21 U.S.C.section 360bbb-3(b)(1), unless the authorization is terminated  or revoked sooner.       Influenza A by PCR NEGATIVE NEGATIVE Final   Influenza B by PCR NEGATIVE NEGATIVE Final    Comment: (NOTE) The Xpert Xpress SARS-CoV-2/FLU/RSV plus assay is intended as an aid in the diagnosis of influenza from Nasopharyngeal swab specimens and should not be used as a sole basis for treatment. Nasal washings and aspirates are unacceptable for Xpert Xpress SARS-CoV-2/FLU/RSV testing.  Fact Sheet for Patients: EntrepreneurPulse.com.au  Fact Sheet for Healthcare Providers: IncredibleEmployment.be  This test is not yet approved or cleared by the [place] FDA and has been authorized for detection and/or diagnosis of SARS-CoV-2 by FDA under an Emergency Use Authorization (EUA). This EUA will remain in effect (meaning this test can be used) for the  duration of the COVID-19 declaration under Section 564(b)(1) of the Act, 21 U.S.C. section 360bbb-3(b)(1), unless the authorization is terminated or revoked.  Performed at [facility], [place] [address], [place], [place] [address]      Medications:    amLODipine  10 mg Oral Daily   apixaban  2.5 mg Oral BID   carvedilol  12.5 mg Oral BID WC   Chlorhexidine Gluconate Cloth  6 each Topical Q0600   hydrALAZINE  50 mg Oral BID   isosorbide mononitrate  30 mg Oral Daily   losartan  100 mg Oral Daily   sodium chloride flush  3 mL Intravenous Q12H   Continuous Infusions:  sodium chloride        LOS: 4 days   Charlynne Cousins  [facility]  12/28/2021, 10:03 AM

## 2021-12-28 NOTE — Plan of Care (Signed)

## 2021-12-28 NOTE — Evaluation (Signed)
Physical Therapy Evaluation Patient Details Name: Monique Henderson MRN: [id number] DOB: February 21, 1951 Today's Date: 12/28/2021  History of Present Illness  71 y.o. female presents to [facility] hospital on 12/24/2021 with AMS, with hypertension. PMH includes ESRD, HTN, PAF, polysubstance abuse, aortic aneurysm dissection s/p repair.  Clinical Impression  Pt presents with impairments in strength and balance. Pt tolerates therapy well today, ambulating household distances without an AD. Pt with considerable drifting and one LOB staggering R during ambulation, despite reporting to PT that she has been ambulating without any issues and no AD while in the hospital. Pt seems unaware to the extent of her deficits and has decreased safety awareness. Continued therapy will assist the pt in improving strength and balance for progression of independence and safer mobility. Future sessions should focus on dynamic balance and AD training and education for safer ambulation.     Recommendations for follow up therapy are one component of a multi-disciplinary discharge planning process, led by the attending physician.  Recommendations may be updated based on patient status, additional functional criteria and insurance authorization.  Follow Up Recommendations Outpatient PT      Assistance Recommended at Discharge Intermittent Supervision/Assistance  Patient can return home with the following  A little help with walking and/or transfers;A little help with bathing/dressing/bathroom;Assistance with cooking/housework;Assist for transportation;Help with stairs or ramp for entrance    Equipment Recommendations None recommended by PT  Recommendations for Other Services       Functional Status Assessment Patient has had a recent decline in their functional status and demonstrates the ability to make significant improvements in function in a reasonable and predictable amount of time.     Precautions / Restrictions  Precautions Precautions: Fall Restrictions Weight Bearing Restrictions: No      Mobility  Bed Mobility Overal bed mobility: Needs Assistance Bed Mobility: Supine to Sit, Sit to Supine     Supine to sit: Supervision, HOB elevated Sit to supine: Supervision, HOB elevated        Transfers Overall transfer level: Needs assistance Equipment used: None Transfers: Sit to/from Stand Sit to Stand: Min guard                Ambulation/Gait Ambulation/Gait assistance: Min guard Gait Distance (Feet): 100 Feet Assistive device: None Gait Pattern/deviations: Step-through pattern, Decreased stride length, Staggering right, Drifts right/left, Staggering left, Wide base of support Gait velocity: decreased Gait velocity interpretation: <1.8 ft/sec, indicate of risk for recurrent falls   General Gait Details: Pt reports she has been moving well without AD; during today's session she has significant unsteadiness and staggering/drifting L and R without AD; PT educates and  highly recommends AD use in home for safety  Stairs            Wheelchair Mobility    Modified Rankin (Stroke Patients Only)       Balance Overall balance assessment: Needs assistance Sitting-balance support: No upper extremity supported, Feet supported Sitting balance-Leahy Scale: Good     Standing balance support: No upper extremity supported, During functional activity Standing balance-Leahy Scale: Fair                               Pertinent Vitals/Pain Pain Assessment Pain Assessment: No/denies pain    Home Living Family/patient expects to be discharged to:: Private residence Living Arrangements: Spouse/significant other (Boyfriend/Fiance) Available Help at Discharge: Family;Available 24 hours/day Type of Home: Apartment Home Access: Level entry  Alternate Level Stairs-Number of Steps: stair lift up to bedroom however at this time is not working but has a rail to  enter/exit; lift is supposed to be fixed soon Home Layout: Two level;Able to live on main level with bedroom/bathroom Home Equipment: Rolling Walker (2 wheels);Cane - single point;BSC/3in1      Prior Function Prior Level of Function : Independent/Modified Independent             Mobility Comments: Reports ind ambulation ADLs Comments: reports indep including driving, medication and money     Hand Dominance   Dominant Hand: Right    Extremity/Trunk Assessment   Upper Extremity Assessment Upper Extremity Assessment: Generalized weakness    Lower Extremity Assessment Lower Extremity Assessment: Generalized weakness    Cervical / Trunk Assessment Cervical / Trunk Assessment: Normal  Communication   Communication: No difficulties  Cognition Arousal/Alertness: Awake/alert Behavior During Therapy: WFL for tasks assessed/performed Overall Cognitive Status: Impaired/Different from baseline Area of Impairment: Safety/judgement, Awareness                         Safety/Judgement: Decreased awareness of safety, Decreased awareness of deficits Awareness: Intellectual (Tells PT that nurses and OT have already seen her move and that she is moving well without an AD - pt with considerable unsteadiness ambulating without AD)            General Comments General comments (skin integrity, edema, etc.): VSS on RA    Exercises     Assessment/Plan    PT Assessment Patient needs continued PT services  PT Problem List Decreased strength;Decreased balance;Decreased coordination;Decreased knowledge of use of DME;Decreased safety awareness;Decreased knowledge of precautions       PT Treatment Interventions DME instruction;Gait training;Stair training;Functional mobility training;Therapeutic activities;Therapeutic exercise;Balance training;Patient/family education    PT Goals (Current goals can be found in the Care Plan section)  Acute Rehab PT Goals Patient Stated Goal:  Return home PT Goal Formulation: With patient Time For Goal Achievement: 01/11/22 Potential to Achieve Goals: Good    Frequency Min 3X/week     Co-evaluation               AM-PAC PT "6 Clicks" Mobility  Outcome Measure Help needed turning from your back to your side while in a flat bed without using bedrails?: A Little Help needed moving from lying on your back to sitting on the side of a flat bed without using bedrails?: A Little Help needed moving to and from a bed to a chair (including a wheelchair)?: A Little Help needed standing up from a chair using your arms (e.g., wheelchair or bedside chair)?: A Little Help needed to walk in hospital room?: A Little Help needed climbing 3-5 steps with a railing? : A Lot 6 Click Score: 17    End of Session   Activity Tolerance: Patient tolerated treatment well Patient left: in bed;with bed alarm set;with call bell/phone within reach Nurse Communication: Mobility status PT Visit Diagnosis: Unsteadiness on feet (R26.81);Other abnormalities of gait and mobility (R26.89);Muscle weakness (generalized) (M62.81);Difficulty in walking, not elsewhere classified (R26.2)    Time: 7616-0737 PT Time Calculation (min) (ACUTE ONLY): 18 min   Charges:   PT Evaluation $PT Eval Low Complexity: 1 Low          Hall Busing, SPT Acute Rehabilitation Office #: [phone]   Hall Busing 12/28/2021, 1:41 PM

## 2021-12-29 DIAGNOSIS — Z515 Encounter for palliative care: Secondary | ICD-10-CM | POA: Diagnosis not present

## 2021-12-29 DIAGNOSIS — N186 End stage renal disease: Secondary | ICD-10-CM | POA: Diagnosis not present

## 2021-12-29 DIAGNOSIS — I161 Hypertensive emergency: Secondary | ICD-10-CM | POA: Diagnosis not present

## 2021-12-29 DIAGNOSIS — G9341 Metabolic encephalopathy: Secondary | ICD-10-CM | POA: Diagnosis not present

## 2021-12-29 DIAGNOSIS — I129 Hypertensive chronic kidney disease with stage 1 through stage 4 chronic kidney disease, or unspecified chronic kidney disease: Secondary | ICD-10-CM | POA: Diagnosis not present

## 2021-12-29 DIAGNOSIS — Z7189 Other specified counseling: Secondary | ICD-10-CM

## 2021-12-29 DIAGNOSIS — I674 Hypertensive encephalopathy: Secondary | ICD-10-CM | POA: Diagnosis not present

## 2021-12-29 DIAGNOSIS — Z992 Dependence on renal dialysis: Secondary | ICD-10-CM | POA: Diagnosis not present

## 2021-12-29 DIAGNOSIS — E875 Hyperkalemia: Secondary | ICD-10-CM | POA: Diagnosis not present

## 2021-12-29 LAB — RENAL FUNCTION PANEL
Albumin: 3.4 g/dL — ABNORMAL LOW (ref 3.5–5.0)
Albumin: 3.3 g/dL — ABNORMAL LOW (ref 3.5–5.0)
Anion gap: 14 (ref 5–15)
Anion gap: 15 (ref 5–15)
BUN: 50 mg/dL — ABNORMAL HIGH (ref 8–23)
BUN: 93 mg/dL — ABNORMAL HIGH (ref 8–23)
CO2: 27 mmol/L (ref 22–32)
CO2: 24 mmol/L (ref 22–32)
Calcium: 9.3 mg/dL (ref 8.9–10.3)
Calcium: 9.5 mg/dL (ref 8.9–10.3)
Chloride: 95 mmol/L — ABNORMAL LOW (ref 98–111)
Chloride: 98 mmol/L (ref 98–111)
Creatinine, Ser: 6.83 mg/dL — ABNORMAL HIGH (ref 0.44–1.00)
Creatinine, Ser: 10.08 mg/dL — ABNORMAL HIGH (ref 0.44–1.00)
GFR, Estimated: 6 mL/min — ABNORMAL LOW (ref >60)
GFR, Estimated: 4 mL/min — ABNORMAL LOW (ref >60)
Glucose, Bld: 112 mg/dL — ABNORMAL HIGH (ref 70–99)
Glucose, Bld: 115 mg/dL — ABNORMAL HIGH (ref 70–99)
Phosphorus: 7.3 mg/dL — ABNORMAL HIGH (ref 2.5–4.6)
Phosphorus: 9.3 mg/dL — ABNORMAL HIGH (ref 2.5–4.6)
Potassium: 3.9 mmol/L (ref 3.5–5.1)
Potassium: 4.2 mmol/L (ref 3.5–5.1)
Sodium: 136 mmol/L (ref 135–145)
Sodium: 137 mmol/L (ref 135–145)

## 2021-12-29 LAB — HEPATITIS B CORE ANTIBODY, TOTAL: Hep B Core Total Ab: NONREACTIVE

## 2021-12-29 LAB — HEPATITIS C ANTIBODY: HCV Ab: REACTIVE — AB

## 2021-12-29 LAB — HEPATITIS B SURFACE ANTIGEN: Hepatitis B Surface Ag: NONREACTIVE

## 2021-12-29 LAB — HEPATITIS B SURFACE ANTIBODY,QUALITATIVE: Hep B S Ab: REACTIVE — AB

## 2021-12-29 MED ORDER — CHLORHEXIDINE GLUCONATE CLOTH 2 % EX PADS
6.0000 | MEDICATED_PAD | Freq: Every day | CUTANEOUS | Status: DC
  Administered 2021-12-30: 6 via TOPICAL

## 2021-12-29 MED ORDER — SEVELAMER CARBONATE 2.4 G PO PACK
2.4000 g | PACK | Freq: Three times a day (TID) | ORAL | Status: DC
  Administered 2021-12-29 – 2021-12-30 (×3): 2.4 g via ORAL
  Filled 2021-12-29 (×4): qty 1

## 2021-12-29 NOTE — Progress Notes (Signed)
Palliative:  HPI: 71 y.o. female  with past medical history of ESRD on HD, refractory HTN, PAF on Eliquis, chronic heart failure reduced EF, cerebral thrombosis with cerebral infarct 2020, COPD, GERD, hep C, noncompliance, history of polysubstance abuse, aortic aneurysm and dissection status postrepair 2020, left breast cancer 2004 left partial mastectomy, lumbar fusion 2015, former smoker quit approximately 2 years ago, admitted on 12/24/2021 with altered mental status, hypertensive encephalopathy secondary to noncompliance with HD and concern for noncompliance with home BP meds.    I met today with Monique Henderson. She seems distracted but does speak with me. She is alert and oriented to person, place, year. She tells me that she wants to go home and that she feels better. I ask her if she remembers why she came to the hospital and she says "fluid buildup?" and I explain her blood pressure was dangerously high.  When I ask her about her high blood pressure and why it would be so bad she tells me "probably because I didn't take my medicine." Does not give a reason why she did not take her medicine. Her main concern is that she just wants to go home. I spoke with her about my concern for her compliance and that she is at very high risk to have a catastrophic stroke or another significant health event that could lead to decline and even death. I explained that this knowledge and risk prompted [place] to ask difficult questions of her sister since Monique Henderson could not speak with [place]. I asked Monique Henderson if she would ever desire for [place] to pump on her chest with CPR or hook her up to breathing machines and life support and she promptly told me that she would not want this - "just let me go." I explained that what we are talking about is called a DNR and she agrees this is what she wants. She gives me permission to call and share our conversation with her sister since she is the one that may get called and asked these questions if Monique Henderson  becomes too ill to tell us herself in the future. Monique Henderson wishes to continue with dialysis and full care otherwise. I encouraged compliance with dialysis and medication regimen if she wishes to prolong her life as long as possible.   I called and spoke with sister, Monique Henderson. I shared my conversation with Monique Henderson and her desire for DNR status. Monique Henderson initially shares that as a family member she would "want to try." I explained with Monique Henderson further about resuscitation measures and expectations of poor prognosis for Monique Henderson if she were to get to the point of needing these measures. I explained that resuscitation would likely only cause Monique Henderson pain and suffering at the end of her life. Monique Henderson expresses understanding and acknowledges that DNR is best for her sister. She expresses desire to help Monique Henderson but does not understand why she does not take care of herself the way she should. She plans on trying to communicate with Monique Henderson about changes that need to happen to help her to take better care of herself.   All questions/concerns addressed. Emotional support provided. Updated RN, Dr. Aileen Fass, Dr. Carolin Sicks.   Exam: Awake, alert. Oriented x 3. No distress. Avoidant of conversation at first. Short answers. Breathing regular, unlabored. Abd flat.   Plan: - DNR decided by patient.  - Continue all care and interventions (including HD) outside DNR.   [place], NP Palliative Medicine Team Pager [phone] (Please see [email or web address] for  schedule) Team Phone [phone]    Greater than 50%  of this time was spent counseling and coordinating care related to the above assessment and plan

## 2021-12-29 NOTE — Progress Notes (Signed)
Mobility Specialist - Progress Note     12/29/21 1000  Mobility  Activity Refused mobility   Pt Refused Mobility. Pt said she had already gotten up today and did not need mobility.    Larey Seat

## 2021-12-29 NOTE — Progress Notes (Signed)
Mobility Specialist Progress Note:   12/29/21 1328  Mobility  Activity Ambulated with assistance in room  Level of Assistance Independent  Assistive Device None  Distance Ambulated (ft) 20 ft  Activity Response Tolerated well  $Mobility charge 1 Mobility   Pt ambulated from bathroom to bed. Left EOB with call bell in reach and all needs met.   [facility] Monique Henderson Mobility Specialist

## 2021-12-29 NOTE — Progress Notes (Signed)
[facility] NEPHROLOGY PROGRESS NOTE  Assessment/ Plan: Dialysis Orders: AF TTS  3h 23mn   400/1.5  44kg  2/2 bath P2  Hep none  [facility] TDC - last HD 7/22, post wt 45kg, usual UFG 1-2kg - last Hb 10.5 on 7/20 - mircera 50 mcg q2, last 7/18, due early Aug - iron sucrose 100 iv q hd thru 8/17 - doxercalciferol 2 ug iv tiw  # AMS multifactorial etiology including uncontrolled hypertension, ESRD, concerned about polysubstance abuse etc.  CT head with no acute finding.  Holding Lyrica.  # ESRD - on HD TTS.  Continue regular dialysis, next HD tomorrow.  #Hyperkalemia -Resolving with HD.   # Volume w/ HTN urgency: IV Cleviprex off and transitioned to her home medications. Chronic noncompliance with antihypertensive medications. Does not look vol overloaded on exam.   # Anemia of ESRD:  Hb above goal, no esa needs.   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 Will hold IV vdra for now as phosphorus is very high.  Start sevelamer as phosphorus binder.   #GOC-patient has over 13 admissions/ED visits this year for non-adherence to medications/HD. Appears to have FFT, lack of ability to care for herself. Seen by Palliative Care. Reviewed note: awaiting family meeting to determine goals of care. Patient remains Full Code at this time. I do agree to continue palliative care services in outpatient setting.  Subjective: Seen and examined at bedside.  Patient is alert awake and comfortable.  Denies any complaint.  No new event.  No chest pain, shortness of breath, nausea or vomiting. Objective Vital signs in last 24 hours: Vitals:   12/28/21 1943 12/29/21 0008 12/29/21 0447 12/29/21 0725  BP: (!) 93/54 121/73 140/83 (!) 146/79  Pulse: 70 70 70 65  Resp: 20 (!) 21 (!) 22 20  Temp: 98.7 F (37.1 C) 98.5 F (36.9 C) 98.5 F (36.9 C) 98.4 F (36.9 C)  TempSrc: Oral Oral Oral Oral  SpO2: 98%     Weight:  41.2 kg    Height:       Weight change: 1.8 kg  Intake/Output Summary (Last 24  hours) at 12/29/2021 0932 Last data filed at 12/29/2021 0448 Gross per 24 hour  Intake 478 ml  Output --  Net 478 ml       Labs: RENAL PANEL Recent Labs    06/07/21 0127 06/08/21 0938 06/09/21 0128 08/16/21 0417 08/23/21 0125 09/15/21 0001 09/15/21 0419 10/29/21 0840 10/29/21 1042 10/30/21 0530 11/18/21 0700 11/18/21 0724 11/18/21 1931 12/07/21 1948 12/07/21 1957 12/08/21 0404 12/24/21 1341 12/24/21 1353 12/25/21 0145 12/25/21 1357 12/26/21 0602 12/27/21 0054 12/28/21 0443 12/29/21 0650  NA 136 135  136   < > 137   < > 140 141   < >  --  138 139   < > 137 141 141 140 149* 145 149* 138 137 139 136 137  K 5.6* 4.5  4.6   < > 4.9   < > 4.2 4.2   < >  --  4.0 5.0   < > 3.4* 4.4 4.2 4.0 5.9* 5.6* 5.0 4.0 4.5 4.1 3.9 4.2  CL 97* 98  98   < > 100   < > 100 101   < >  --  100 97*   < > 96* 102 104 101 104 108 106 96* 97* 96* 95* 98  CO2 19* 22  23   < > 23   < > 29 29   < >  --  26 23  --  27 23  --  22 21*  --  '24 23 25 26 27 24  '$ GLUCOSE 101* 89  87   < > 90   < > 110* 117*   < >  --  97 121*   < > 104* 149* 143* 108* 104* 105* 91 92 90 99 112* 115*  BUN 123* 62*  62*   < > 54*   < > 40* 41*   < >  --  75* 46*   < > 14 59* 53* 61* 93* 89* 112* 35* 50* 75* 50* 93*  CREATININE 18.20* 11.73*  11.70*   < > 9.49*   < > 6.48* 6.74*   < >  --  9.33* 9.49*   < > 4.27* 10.98* 11.90* 11.40* 13.37* 14.70* 14.47* 6.22* 8.11* 10.32* 6.83* 10.08*  CALCIUM 7.7* 8.2*  8.2*   < > 9.2   < > 10.5* 10.4*   < >  --  9.2 10.7*  --  9.5 10.0  --  9.6 10.1  --  9.5 9.3 9.5 9.1 9.3 9.5  MG 2.6* 2.2  --  2.3  --  2.5*  --   --  2.6*  --   --   --   --   --   --   --   --   --   --   --   --   --   --   --   PHOS 11.2* 9.3*   < >  --    < > 5.0* 5.0*  --  7.6* 5.6*  --   --   --   --   --   --  11.1*  --   --  6.5* 8.8* 10.4* 7.3* 9.3*  ALBUMIN  --  2.5*  2.5*   < >  --    < > 3.9 3.6  --   --  3.6 4.0  --   --  3.7  --   --  4.2  --   --  3.5 3.4* 3.3* 3.4* 3.3*   < > = values in this  interval not displayed.     Liver Function Tests: Recent Labs  Lab 12/24/21 1341 12/25/21 1357 12/27/21 0054 12/28/21 0443 12/29/21 0650  AST 33  --   --   --   --   ALT 32  --   --   --   --   ALKPHOS 66  --   --   --   --   BILITOT 1.1  --   --   --   --   PROT 7.6  --   --   --   --   ALBUMIN 4.2   < > 3.3* 3.4* 3.3*   < > = values in this interval not displayed.   No results for input(s): "LIPASE", "AMYLASE" in the last 168 hours. Recent Labs  Lab 12/24/21 1341  AMMONIA 26   CBC: Recent Labs    06/04/21 2256 06/06/21 1347 12/07/21 1957 12/08/21 0404 12/24/21 1341 12/24/21 1353 12/26/21 0600  HGB  --    < > 11.6* 9.7* 11.4* 12.9 12.5  MCV  --    < >  --  89.3 94.6  --  90.9  VITAMINB12 530  --   --   --   --   --   --   FOLATE 10.5  --   --   --   --   --   --  FERRITIN 1,454*  --   --   --   --   --   --   TIBC 245*  --   --   --   --   --   --   IRON 32  --   --   --   --   --   --   RETICCTPCT 1.8  --   --   --   --   --   --    < > = values in this interval not displayed.    Cardiac Enzymes: No results for input(s): "CKTOTAL", "CKMB", "CKMBINDEX", "TROPONINI" in the last 168 hours. CBG: Recent Labs  Lab 12/24/21 1315  GLUCAP 100*    Iron Studies: No results for input(s): "IRON", "TIBC", "TRANSFERRIN", "FERRITIN" in the last 72 hours. Studies/Results: No results found.  Medications: Infusions:  sodium chloride      Scheduled Medications:  amLODipine  10 mg Oral Daily   apixaban  2.5 mg Oral BID   carvedilol  12.5 mg Oral BID WC   Chlorhexidine Gluconate Cloth  6 each Topical Q0600   hydrALAZINE  50 mg Oral BID   isosorbide mononitrate  30 mg Oral Daily   losartan  100 mg Oral Daily   sodium chloride flush  3 mL Intravenous Q12H    have reviewed scheduled and prn medications.  Physical Exam: General:NAD, comfortable Heart:RRR, s1s2 nl Lungs:clear b/l, no crackle Abdomen:soft, Non-tender, non-distended Extremities:No edema Dialysis  Access: Right IJ TDC  Monique Henderson Monique Henderson 12/29/2021,9:32 AM  LOS: 5 days

## 2021-12-29 NOTE — Progress Notes (Signed)
Physical Therapy Treatment Patient Details Name: Monique Henderson MRN: [id number] DOB: 27-Jul-1950 Today's Date: 12/29/2021   History of Present Illness 71 y.o. female presents to [facility] hospital on 12/24/2021 with AMS, with hypertension. PMH includes ESRD, HTN, PAF, polysubstance abuse, aortic aneurysm dissection s/p repair.    PT Comments    Received pt sitting in recliner, per RN, pt just refused mobility tech. Pt reluctantly agreed to PT treatment with encouragement and explanation on purposes/benefits of PT. Pt performed all transfers without AD and supervision throughout session and ambulated through hallway without AD and min guard with 1 instance staggering R, requiring min A to correct. Pt then requested to return to room, stating "no, I'm good" when asked to continue session. Acute PT to cont to follow.    Recommendations for follow up therapy are one component of a multi-disciplinary discharge planning process, led by the attending physician.  Recommendations may be updated based on patient status, additional functional criteria and insurance authorization.  Follow Up Recommendations  Outpatient PT     Assistance Recommended at Discharge Intermittent Supervision/Assistance  Patient can return home with the following A little help with walking and/or transfers;A little help with bathing/dressing/bathroom;Assistance with cooking/housework;Assist for transportation;Help with stairs or ramp for entrance;Direct supervision/assist for medications management;Direct supervision/assist for financial management   Equipment Recommendations  None recommended by PT    Recommendations for Other Services       Precautions / Restrictions Precautions Precautions: Fall Restrictions Weight Bearing Restrictions: No     Mobility  Bed Mobility               General bed mobility comments: sitting in recliner upon entry/exit Patient Response: Flat affect  Transfers Overall transfer  level: Needs assistance Equipment used: None Transfers: Sit to/from Stand Sit to Stand: Supervision                Ambulation/Gait Ambulation/Gait assistance: Min guard Gait Distance (Feet): 90 Feet Assistive device: None Gait Pattern/deviations: Step-through pattern, Decreased stride length, Staggering right, Drifts right/left Gait velocity: decreased Gait velocity interpretation: <1.8 ft/sec, indicate of risk for recurrent falls   General Gait Details: Pt with 1 instance staggering to R requiring min A to correct.   Stairs             Wheelchair Mobility    Modified Rankin (Stroke Patients Only)       Balance Overall balance assessment: Needs assistance         Standing balance support: No upper extremity supported, During functional activity Standing balance-Leahy Scale: Fair Standing balance comment: able to maintain static standing balance with supervision but required min guard for dynamic standing balance for safety.                            Cognition Arousal/Alertness: Awake/alert Behavior During Therapy: Flat affect Overall Cognitive Status: Impaired/Different from baseline Area of Impairment: Safety/judgement, Awareness                         Safety/Judgement: Decreased awareness of safety, Decreased awareness of deficits     General Comments: per RN, pt had just refused session with mobility tech. Required encouragement, but pt reluctantly agreed to participate with PT        Exercises      General Comments General comments (skin integrity, edema, etc.): VSS on RA      Pertinent Vitals/Pain Pain Assessment Pain Assessment:  No/denies pain    Home Living Family/patient expects to be discharged to:: Private residence Living Arrangements: Spouse/significant other                      Prior Function            PT Goals (current goals can now be found in the care plan section) Acute Rehab PT  Goals Patient Stated Goal: Return home PT Goal Formulation: With patient Time For Goal Achievement: 01/11/22 Potential to Achieve Goals: Good Progress towards PT goals: Progressing toward goals    Frequency    Min 3X/week      PT Plan Current plan remains appropriate    Co-evaluation              AM-PAC PT "6 Clicks" Mobility   Outcome Measure                   End of Session Equipment Utilized During Treatment: Gait belt Activity Tolerance: Patient tolerated treatment well Patient left: with call bell/phone within reach;in chair;with chair alarm set Nurse Communication: Mobility status PT Visit Diagnosis: Unsteadiness on feet (R26.81);Other abnormalities of gait and mobility (R26.89);Muscle weakness (generalized) (M62.81);Difficulty in walking, not elsewhere classified (R26.2)     Time: 3817-7116 PT Time Calculation (min) (ACUTE ONLY): 12 min  Charges:  $Gait Training: 8-22 mins                     Becky Sax PT, DPT  Blenda Nicely 12/29/2021, 11:34 AM

## 2021-12-29 NOTE — Progress Notes (Signed)
Occupational Therapy Treatment Patient Details Name: Monique Henderson MRN: [id number] DOB: 09/05/1950 Today's Date: 12/29/2021   History of present illness 71 y.o. female presents to [facility] hospital on 12/24/2021 with AMS, with hypertension. PMH includes ESRD, HTN, PAF, polysubstance abuse, aortic aneurysm dissection s/p repair.   OT comments  Patient received sitting on EOB and agreeable to OT treatment. Patient was min guard assist to ambulate to bathroom without an assistive device and performed toilet hygiene without assistance seated. Patient performed grooming, bathing, and dressing standing at sink without seated rest break with min guard to setup and verbal cues for sequencing. Patient is making good gains and is appropriate for current discharge recommendations. Acute OT to continue to follow.    Recommendations for follow up therapy are one component of a multi-disciplinary discharge planning process, led by the attending physician.  Recommendations may be updated based on patient status, additional functional criteria and insurance authorization.    Follow Up Recommendations  Home health OT    Assistance Recommended at Discharge Frequent or constant Supervision/Assistance  Patient can return home with the following  A little help with walking and/or transfers;A little help with bathing/dressing/bathroom;Assistance with cooking/housework;Direct supervision/assist for medications management;Direct supervision/assist for financial management;Assist for transportation   Equipment Recommendations  None recommended by OT    Recommendations for Other Services      Precautions / Restrictions Precautions Precautions: Fall Restrictions Weight Bearing Restrictions: No       Mobility Bed Mobility Overal bed mobility: Needs Assistance             General bed mobility comments: seated on EOB    Transfers Overall transfer level: Needs assistance Equipment used: None Transfers:  Sit to/from Stand Sit to Stand: Min guard           General transfer comment: min guard for safety     Balance Overall balance assessment: Needs assistance Sitting-balance support: No upper extremity supported, Feet supported Sitting balance-Leahy Scale: Good Sitting balance - Comments: sitting on EOB upon entry   Standing balance support: No upper extremity supported, During functional activity Standing balance-Leahy Scale: Fair Standing balance comment: able to stand at sink with no UE support to perform self care tasks                           ADL either performed or assessed with clinical judgement   ADL Overall ADL's : Needs assistance/impaired     Grooming: Wash/dry hands;Wash/dry face;Oral care;Set up;Supervision/safety;Standing Grooming Details (indicate cue type and reason): perfromed standing at sink with cues for sequencing Upper Body Bathing: Min guard;Cueing for safety;Cueing for sequencing Upper Body Bathing Details (indicate cue type and reason): performed standing at sink Lower Body Bathing: Min guard;Cueing for safety;Cueing for sequencing;Sit to/from stand Lower Body Bathing Details (indicate cue type and reason): standing at sink Upper Body Dressing : Min guard;Cueing for safety;Cueing for sequencing;Standing Upper Body Dressing Details (indicate cue type and reason): changed gowns while standing     Toilet Transfer: Min guard;Ambulation;Regular Glass blower/designer Details (indicate cue type and reason): no assistive device to ambulate to bathroom and to toilet Toileting- Clothing Manipulation and Hygiene: Supervision/safety;Sitting/lateral lean Toileting - Clothing Manipulation Details (indicate cue type and reason): able to perform toilet hygiene seated       General ADL Comments: required cues for sequencing and safety    Extremity/Trunk Assessment              Vision  Perception     Praxis      Cognition  Arousal/Alertness: Awake/alert Behavior During Therapy: WFL for tasks assessed/performed Overall Cognitive Status: Impaired/Different from baseline Area of Impairment: Safety/judgement, Awareness                         Safety/Judgement: Decreased awareness of safety, Decreased awareness of deficits Awareness: Intellectual   General Comments: answered most questions with "I'm good". able to give correct date but thought it was Sunday        Exercises      Shoulder Instructions       General Comments      Pertinent Vitals/ Pain       Pain Assessment Pain Assessment: No/denies pain  Home Living Family/patient expects to be discharged to:: Private residence Living Arrangements: Spouse/significant other                                      Prior Functioning/Environment              Frequency  Min 2X/week        Progress Toward Goals  OT Goals(current goals can now be found in the care plan section)  Progress towards OT goals: Progressing toward goals  Acute Rehab OT Goals Patient Stated Goal: go home OT Goal Formulation: With patient Time For Goal Achievement: 01/10/22 Potential to Achieve Goals: Good ADL Goals Pt Will Perform Upper Body Bathing: Independently;sitting;standing Pt Will Perform Lower Body Bathing: with modified independence;sit to/from stand;with adaptive equipment Pt Will Transfer to Toilet: with modified independence;ambulating;regular height toilet;grab bars Pt Will Perform Tub/Shower Transfer: with modified independence;ambulating;shower seat;grab bars;rolling walker  Plan Discharge plan remains appropriate    Co-evaluation                 AM-PAC OT "6 Clicks" Daily Activity     Outcome Measure   Help from another person eating meals?: None Help from another person taking care of personal grooming?: A Little Help from another person toileting, which includes using toliet, bedpan, or urinal?: A  Little Help from another person bathing (including washing, rinsing, drying)?: A Little Help from another person to put on and taking off regular upper body clothing?: A Little Help from another person to put on and taking off regular lower body clothing?: A Little 6 Click Score: 19    End of Session    OT Visit Diagnosis: Unsteadiness on feet (R26.81);Other abnormalities of gait and mobility (R26.89);Muscle weakness (generalized) (M62.81)   Activity Tolerance Patient tolerated treatment well   Patient Left in chair;with call bell/phone within reach;with chair alarm set   Nurse Communication Mobility status        Time: [phone] OT Time Calculation (min): 28 min  Charges: OT General Charges $OT Visit: 1 Visit OT Treatments $Self Care/Home Management : 23-37 mins  Lodema Hong, [place]  Office [phone]   Trixie Dredge 12/29/2021, 9:43 AM

## 2021-12-29 NOTE — Progress Notes (Signed)
[facility] PROGRESS NOTE    Progress Note  Monique Henderson  HUT:[id number] DOB: 1951-03-17 DOA: 12/24/2021 PCP: Merryl Hacker, No     Brief Narrative:   Monique Henderson is an 71 y.o. female past medical history of end-stage renal disease on dialysis Tuesday Thursdays an Saturdays, essential hypertension, paroxysmal atrial fibrillation, polysubstance abuse, recent history of cocaine use, history of aortic aneurysm and dissection s/p repair presents to the ED with altered mental status, she was found to be in hypertensive urgency with blood pressure over 200 started on IV drip, underwent hemodialysis overnight and hemodynamically she stabilized.   Assessment/Plan:   Acute metabolic encephalopathy: With a recent history of cocaine use.  This patient for polysubstance abuse. UDS is pending. No evidence of factious etiology no focal deficits, CT of the head showed no acute findings. Holding Lyrica, Remeron which can contribute to encephalopathy.  Hypertensive emergency Started back on her hemodialysis.   Titrating antihypertensive medications as tolerated. Systemic blood pressure is significantly improved. Continue amlodipine, Coreg, losartan, hydralazine and Imdur  End-stage renal disease on hemodialysis/hyperkalemia: Renal was consulted she underwent emergent dialysis.  For HD today. We will continue dialysis Tuesday Thursdays and Saturdays.  Paroxysmal atrial fibrillation: Continue Coreg and Eliquis she seems to be rate controlled.  Goals of care: She has had over 9 admissions this year most of them due to not adherence to her hemodialysis treatment. Plan of care met with patient on 12/27/2021 and awaiting meeting with family to discuss goals of care and moving towards hospice..  DVT prophylaxis: lovenox Family Communication:none Status is: Inpatient Remains inpatient appropriate because: Acute metabolic encephalopathy and hypertensive urgency in the setting of polysubstance  abuse    Code Status:     Code Status Orders  (From admission, onward)           Start     Ordered   12/24/21 1616  Full code  Continuous        12/24/21 1616           Code Status History     Date Active Date Inactive Code Status Order ID Comments User Context   12/24/2021 1523 12/24/2021 1616 Full Code [id number]  Candee Furbish, MD ED   12/07/2021 2310 12/08/2021 2136 Full Code [id number]  Etta Quill, DO ED   10/29/2021 1126 10/31/2021 0030 Full Code [id number]  Norval Morton, MD ED   09/15/2021 0156 09/16/2021 0001 Full Code [id number]  Erskine Emery, MD ED   08/23/2021 0858 08/24/2021 2021 Full Code [id number]  Wells Guiles, DO ED   08/12/2021 1702 08/17/2021 0101 Full Code [id number]  France Ravens, MD ED   06/19/2021 0725 06/22/2021 0049 Full Code [id number]  Phillips Grout, MD ED   06/05/2021 0144 06/09/2021 2022 Full Code [id number]  Rise Patience, MD ED   12/23/2020 1429 12/24/2020 2027 Full Code [id number]  Karmen Bongo, MD ED   09/21/2020 2313 09/27/2020 1945 Full Code [id number]  Vianne Bulls, MD ED   08/28/2019 0317 08/30/2019 2326 Full Code [id number]  Vianne Bulls, MD ED   05/18/2019 2053 05/24/2019 1953 Full Code [id number]  Jennye Boroughs, MD Inpatient   04/07/2019 1721 04/23/2019 1432 Full Code [id number]  Wonda Olds, MD Inpatient   04/04/2019 1402 04/07/2019 1721 Full Code [id number]  Wonda Olds, MD Inpatient   01/18/2014 1123 01/19/2014 1427 Full Code [id number]  Eustace Moore, MD Inpatient   03/23/2013 1906 03/24/2013 1643 Full Code [id number]  Eustace Moore,  MD Inpatient   02/27/2013 1736 03/01/2013 1937 Full Code [id number]  Otho Bellows, MD Inpatient         IV Access:   Peripheral IV   Procedures and diagnostic studies:   No results found.   Medical Consultants:   None.   Subjective:    Monique Henderson no complaints this morning awaiting  Care meeting.  Objective:    Vitals:   12/28/21 1541 12/28/21 1943 12/29/21 0008  12/29/21 0447  BP: 107/66 (!) 93/54 121/73 140/83  Pulse: 74 70 70 70  Resp: 20 20 (!) 21 (!) 22  Temp: 98.7 F (37.1 C) 98.7 F (37.1 C) 98.5 F (36.9 C) 98.5 F (36.9 C)  TempSrc: Oral Oral Oral Oral  SpO2: 99% 98%    Weight:   41.2 kg   Height:       SpO2: 98 %   Intake/Output Summary (Last 24 hours) at 12/29/2021 0730 Last data filed at 12/29/2021 0448 Gross per 24 hour  Intake 716 ml  Output --  Net 716 ml    Filed Weights   12/27/21 0945 12/28/21 0035 12/29/21 0008  Weight: 39.4 kg 38.2 kg 41.2 kg    Exam: General exam: In no acute distress. Respiratory system: Good air movement and clear to auscultation. Cardiovascular system: S1 & S2 heard, RRR. No JVD. Gastrointestinal system: Abdomen is nondistended, soft and nontender.  Extremities: No pedal edema. Skin: No rashes, lesions or ulcers Psychiatry: Judgement and insight appear normal. Mood & affect appropriate.  Data Reviewed:    Labs: Basic Metabolic Panel: Recent Labs  Lab 12/24/21 1341 12/24/21 1353 12/25/21 0145 12/25/21 1357 12/26/21 0602 12/27/21 0054 12/28/21 0443  NA 149*   < > 149* 138 137 139 136  K 5.9*   < > 5.0 4.0 4.5 4.1 3.9  CL 104   < > 106 96* 97* 96* 95*  CO2 21*  --  _0 GLUCOSE 104*   < > 91 92 90 99 112*  BUN 93*   < > 112* 35* 50* 75* 50*  CREATININE 13.37*   < > 14.47* 6.22* 8.11* 10.32* 6.83*  CALCIUM 10.1  --  9.5 9.3 9.5 9.1 9.3  PHOS 11.1*  --   --  6.5* 8.8* 10.4* 7.3*   < > = values in this interval not displayed.    GFR Estimated Creatinine Clearance: 5 mL/min (A) (by C-G formula based on SCr of 6.83 mg/dL (H)). Liver Function Tests: Recent Labs  Lab 12/24/21 1341 12/25/21 1357 12/26/21 0602 12/27/21 0054 12/28/21 0443  AST 33  --   --   --   --   ALT 32  --   --   --   --   ALKPHOS 66  --   --   --   --   BILITOT 1.1  --   --   --   --   PROT 7.6  --   --   --   --   ALBUMIN 4.2 3.5 3.4* 3.3* 3.4*    No results for input(s): "LIPASE",  "AMYLASE" in the last 168 hours. Recent Labs  Lab 12/24/21 1341  AMMONIA 26    Coagulation profile Recent Labs  Lab 12/24/21 1341  INR 1.5*    COVID-19 Labs  No results for input(s): "DDIMER", "FERRITIN", "LDH", "CRP" in the last 72 hours.  Lab Results  Component Value Date   [place] NEGATIVE 12/24/2021   [place] NEGATIVE 10/29/2021  [place] NEGATIVE 09/15/2021   [place] NEGATIVE 08/23/2021    CBC: Recent Labs  Lab 12/24/21 1341 12/24/21 1353 12/26/21 0600  WBC 4.8  --  4.5  NEUTROABS 4.2  --   --   HGB 11.4* 12.9 12.5  HCT 36.5 38.0 38.0  MCV 94.6  --  90.9  PLT 244  --  238    Cardiac Enzymes: No results for input(s): "CKTOTAL", "CKMB", "CKMBINDEX", "TROPONINI" in the last 168 hours. BNP (last 3 results) No results for input(s): "PROBNP" in the last 8760 hours. CBG: Recent Labs  Lab 12/24/21 1315  GLUCAP 100*    D-Dimer: No results for input(s): "DDIMER" in the last 72 hours. Hgb A1c: No results for input(s): "HGBA1C" in the last 72 hours. Lipid Profile: No results for input(s): "CHOL", "HDL", "LDLCALC", "TRIG", "CHOLHDL", "LDLDIRECT" in the last 72 hours. Thyroid function studies: No results for input(s): "TSH", "T4TOTAL", "T3FREE", "THYROIDAB" in the last 72 hours.  Invalid input(s): "FREET3" Anemia work up: No results for input(s): "VITAMINB12", "FOLATE", "FERRITIN", "TIBC", "IRON", "RETICCTPCT" in the last 72 hours. Sepsis Labs: Recent Labs  Lab 12/24/21 1341 12/26/21 0600  WBC 4.8 4.5    Microbiology Recent Results (from the past 240 hour(s))  Resp Panel by RT-PCR (Flu A&B, Covid) Anterior Nasal Swab     Status: None   Collection Time: 12/24/21  1:34 PM   Specimen: Anterior Nasal Swab  Result Value Ref Range Status   SARS Coronavirus 2 by RT PCR NEGATIVE NEGATIVE Final    Comment: (NOTE) SARS-CoV-2 target nucleic acids are NOT DETECTED.  The SARS-CoV-2 RNA is generally detectable in upper respiratory specimens  during the acute phase of infection. The lowest concentration of SARS-CoV-2 viral copies this assay can detect is 138 copies/mL. A negative result does not preclude SARS-Cov-2 infection and should not be used as the sole basis for treatment or other patient management decisions. A negative result may occur with  improper specimen collection/handling, submission of specimen other than nasopharyngeal swab, presence of viral mutation(s) within the areas targeted by this assay, and inadequate number of viral copies(<138 copies/mL). A negative result must be combined with clinical observations, patient history, and epidemiological information. The expected result is Negative.  Fact Sheet for Patients:  EntrepreneurPulse.com.au  Fact Sheet for Healthcare Providers:  IncredibleEmployment.be  This test is no t yet approved or cleared by the [place] FDA and  has been authorized for detection and/or diagnosis of SARS-CoV-2 by FDA under an Emergency Use Authorization (EUA). This EUA will remain  in effect (meaning this test can be used) for the duration of the COVID-19 declaration under Section 564(b)(1) of the Act, 21 U.S.C.section 360bbb-3(b)(1), unless the authorization is terminated  or revoked sooner.       Influenza A by PCR NEGATIVE NEGATIVE Final   Influenza B by PCR NEGATIVE NEGATIVE Final    Comment: (NOTE) The Xpert Xpress SARS-CoV-2/FLU/RSV plus assay is intended as an aid in the diagnosis of influenza from Nasopharyngeal swab specimens and should not be used as a sole basis for treatment. Nasal washings and aspirates are unacceptable for Xpert Xpress SARS-CoV-2/FLU/RSV testing.  Fact Sheet for Patients: EntrepreneurPulse.com.au  Fact Sheet for Healthcare Providers: IncredibleEmployment.be  This test is not yet approved or cleared by the [place] FDA and has been authorized for detection  and/or diagnosis of SARS-CoV-2 by FDA under an Emergency Use Authorization (EUA). This EUA will remain in effect (meaning this test can be used) for the duration of the COVID-19  declaration under Section 564(b)(1) of the Act, 21 U.S.C. section 360bbb-3(b)(1), unless the authorization is terminated or revoked.  Performed at [facility], [place] [address], [place], [place] [address]      Medications:    amLODipine  10 mg Oral Daily   apixaban  2.5 mg Oral BID   carvedilol  12.5 mg Oral BID WC   Chlorhexidine Gluconate Cloth  6 each Topical Q0600   hydrALAZINE  50 mg Oral BID   isosorbide mononitrate  30 mg Oral Daily   losartan  100 mg Oral Daily   sodium chloride flush  3 mL Intravenous Q12H   Continuous Infusions:  sodium chloride        LOS: 5 days   Charlynne Cousins  [facility]  12/29/2021, 7:30 AM

## 2021-12-30 DIAGNOSIS — E875 Hyperkalemia: Secondary | ICD-10-CM | POA: Diagnosis not present

## 2021-12-30 DIAGNOSIS — N186 End stage renal disease: Secondary | ICD-10-CM | POA: Diagnosis not present

## 2021-12-30 DIAGNOSIS — I674 Hypertensive encephalopathy: Secondary | ICD-10-CM | POA: Diagnosis not present

## 2021-12-30 DIAGNOSIS — G9341 Metabolic encephalopathy: Secondary | ICD-10-CM | POA: Diagnosis not present

## 2021-12-30 LAB — HEPATITIS B SURFACE ANTIBODY, QUANTITATIVE: Hep B S AB Quant (Post): 96.6 m[IU]/mL (ref >9.9)

## 2021-12-30 NOTE — Progress Notes (Signed)
Pt for d/c today and to proceed to [facility] SW to receive out-pt HD today at clinic at normal scheduled time. Spoke to pt via phone and she is aware of need to proceed to out-pt clinic to receive today's treatment. Pt has contacted someone to come pick her up and transport her to clinic. Attending and nephrologist agreeable to this plan. RN and [facility] staff aware of plan as well. Renal PA to send orders to out-pt clinic. Spoke to [place] at [facility] SW to make clinic aware that pt will d/c from [facility] and proceed to clinic for treatment.   Melven Sartorius Renal Navigator [phone]

## 2021-12-30 NOTE — Progress Notes (Signed)
[facility] NEPHROLOGY PROGRESS NOTE  Assessment/ Plan: Dialysis Orders: AF TTS  3h 40mn   400/1.5  44kg  2/2 bath P2  Hep none  RIJ TDC - last HD 7/22, post wt 45kg, usual UFG 1-2kg - last Hb 10.5 on 7/20 - mircera 50 mcg q2, last 7/18, due early Aug - iron sucrose 100 iv q hd thru 8/17 - doxercalciferol 2 ug iv tiw  # AMS multifactorial etiology including uncontrolled hypertension, ESRD, concerned about polysubstance abuse etc.  CT head with no acute finding.  Holding Lyrica.  # ESRD - on HD TTS.  Patient was already discharged from the hospital with plan to receive HD at outpatient.  #Hyperkalemia -Resolving with HD.   # Volume w/ HTN urgency: IV Cleviprex off and transitioned to her home medications. Chronic noncompliance with antihypertensive medications. Does not look vol overloaded on exam.   # Anemia of ESRD:  Hb above goal, no esa needs.   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 Will hold IV vdra for now as phosphorus is very high.  Started sevelamer as phosphorus binder.   #GOC-patient has over 13 admissions/ED visits this year for non-adherence to medications/HD. Appears to have FFT, lack of ability to care for herself. Seen by Palliative Care.  The patient is now DNR.  She is being discharged today and dialysis this afternoon.  Discussed with the primary team and social worker.  Subjective: Seen and examined at bedside.  Feels good.  She is alert awake and looks much better.  She wants to go home today.  Denies nausea, vomiting, chest pain, shortness of breath. Objective Vital signs in last 24 hours: Vitals:   12/29/21 0725 12/29/21 1948 12/30/21 0253 12/30/21 0817  BP: (!) 146/79 103/61 139/83 (!) 157/79  Pulse: 65 64 64 71  Resp: '20 18 18 17  '$ Temp: 98.4 F (36.9 C) 98.6 F (37 C) 98.4 F (36.9 C) 97.6 F (36.4 C)  TempSrc: Oral Oral Oral Oral  SpO2:  98% 99% 98%  Weight:   42.1 kg   Height:       Weight change: 0.9 kg  Intake/Output Summary  (Last 24 hours) at 12/30/2021 1136 Last data filed at 12/30/2021 0600 Gross per 24 hour  Intake 240 ml  Output 0 ml  Net 240 ml        Labs: RENAL PANEL Recent Labs    06/07/21 0127 06/08/21 0938 06/09/21 0128 08/16/21 0417 08/23/21 0125 09/15/21 0001 09/15/21 0419 10/29/21 0840 10/29/21 1042 10/30/21 0530 11/18/21 0700 11/18/21 0724 11/18/21 1931 12/07/21 1948 12/07/21 1957 12/08/21 0404 12/24/21 1341 12/24/21 1353 12/25/21 0145 12/25/21 1357 12/26/21 0602 12/27/21 0054 12/28/21 0443 12/29/21 0650  NA 136 135  136   < > 137   < > 140 141   < >  --  138 139   < > 137 141 141 140 149* 145 149* 138 137 139 136 137  K 5.6* 4.5  4.6   < > 4.9   < > 4.2 4.2   < >  --  4.0 5.0   < > 3.4* 4.4 4.2 4.0 5.9* 5.6* 5.0 4.0 4.5 4.1 3.9 4.2  CL 97* 98  98   < > 100   < > 100 101   < >  --  100 97*   < > 96* 102 104 101 104 108 106 96* 97* 96* 95* 98  CO2 19* 22  23   < > 23   < >  29 29   < >  --  26 23  --  27 23  --  22 21*  --  '24 23 25 26 27 24  '$ GLUCOSE 101* 89  87   < > 90   < > 110* 117*   < >  --  97 121*   < > 104* 149* 143* 108* 104* 105* 91 92 90 99 112* 115*  BUN 123* 62*  62*   < > 54*   < > 40* 41*   < >  --  75* 46*   < > 14 59* 53* 61* 93* 89* 112* 35* 50* 75* 50* 93*  CREATININE 18.20* 11.73*  11.70*   < > 9.49*   < > 6.48* 6.74*   < >  --  9.33* 9.49*   < > 4.27* 10.98* 11.90* 11.40* 13.37* 14.70* 14.47* 6.22* 8.11* 10.32* 6.83* 10.08*  CALCIUM 7.7* 8.2*  8.2*   < > 9.2   < > 10.5* 10.4*   < >  --  9.2 10.7*  --  9.5 10.0  --  9.6 10.1  --  9.5 9.3 9.5 9.1 9.3 9.5  MG 2.6* 2.2  --  2.3  --  2.5*  --   --  2.6*  --   --   --   --   --   --   --   --   --   --   --   --   --   --   --   PHOS 11.2* 9.3*   < >  --    < > 5.0* 5.0*  --  7.6* 5.6*  --   --   --   --   --   --  11.1*  --   --  6.5* 8.8* 10.4* 7.3* 9.3*  ALBUMIN  --  2.5*  2.5*   < >  --    < > 3.9 3.6  --   --  3.6 4.0  --   --  3.7  --   --  4.2  --   --  3.5 3.4* 3.3* 3.4* 3.3*   < > = values in  this interval not displayed.      Liver Function Tests: Recent Labs  Lab 12/24/21 1341 12/25/21 1357 12/27/21 0054 12/28/21 0443 12/29/21 0650  AST 33  --   --   --   --   ALT 32  --   --   --   --   ALKPHOS 66  --   --   --   --   BILITOT 1.1  --   --   --   --   PROT 7.6  --   --   --   --   ALBUMIN 4.2   < > 3.3* 3.4* 3.3*   < > = values in this interval not displayed.    No results for input(s): "LIPASE", "AMYLASE" in the last 168 hours. Recent Labs  Lab 12/24/21 1341  AMMONIA 26    CBC: Recent Labs    06/04/21 2256 06/06/21 1347 12/07/21 1957 12/08/21 0404 12/24/21 1341 12/24/21 1353 12/26/21 0600  HGB  --    < > 11.6* 9.7* 11.4* 12.9 12.5  MCV  --    < >  --  89.3 94.6  --  90.9  VITAMINB12 530  --   --   --   --   --   --  FOLATE 10.5  --   --   --   --   --   --   FERRITIN 1,454*  --   --   --   --   --   --   TIBC 245*  --   --   --   --   --   --   IRON 32  --   --   --   --   --   --   RETICCTPCT 1.8  --   --   --   --   --   --    < > = values in this interval not displayed.     Cardiac Enzymes: No results for input(s): "CKTOTAL", "CKMB", "CKMBINDEX", "TROPONINI" in the last 168 hours. CBG: Recent Labs  Lab 12/24/21 1315  GLUCAP 100*     Iron Studies: No results for input(s): "IRON", "TIBC", "TRANSFERRIN", "FERRITIN" in the last 72 hours. Studies/Results: No results found.  Medications: Infusions:  sodium chloride      Scheduled Medications:  amLODipine  10 mg Oral Daily   apixaban  2.5 mg Oral BID   carvedilol  12.5 mg Oral BID WC   Chlorhexidine Gluconate Cloth  6 each Topical Q0600   hydrALAZINE  50 mg Oral BID   isosorbide mononitrate  30 mg Oral Daily   losartan  100 mg Oral Daily   sevelamer carbonate  2.4 g Oral TID WC   sodium chloride flush  3 mL Intravenous Q12H    have reviewed scheduled and prn medications.  Physical Exam: General:NAD, comfortable Heart:RRR, s1s2 nl Lungs: Clear b/l, no crackle Abdomen:soft,  Non-tender, non-distended Extremities:No edema Dialysis Access: Right IJ TDC  Monique Henderson Monique Henderson 12/30/2021,11:36 AM  LOS: 6 days

## 2021-12-30 NOTE — Care Management Important Message (Signed)
Important Message  Patient Details  Name: Monique Henderson MRN: [id number] Date of Birth: March 22, 1951   Medicare Important Message Given:  Yes     [name] 12/30/2021, 9:40 AM

## 2021-12-30 NOTE — Discharge Summary (Signed)
Physician Discharge Summary  Monique Henderson RJJ:[id number] DOB: 1951/03/23 DOA: 12/24/2021  PCP: Merryl Hacker, No  Admit date: 12/24/2021 Discharge date: 12/30/2021  Admitted From: Home  Disposition:  home  Recommendations for Outpatient Follow-up:  Follow up with PCP in 1-2 weeks Please obtain BMP/CBC in one week   Home Health:No Equipment/Devices:None  Discharge Condition:Stable CODE STATUS:DNR Diet recommendation: Heart Healthy  Brief/Interim Summary: 71 y.o. female past medical history of end-stage renal disease on dialysis Tuesday Thursdays an Saturdays, essential hypertension, paroxysmal atrial fibrillation, polysubstance abuse, recent history of cocaine use, history of aortic aneurysm and dissection s/p repair presents to the ED with altered mental status, she was found to be in hypertensive urgency with blood pressure over 200 started on IV drip, underwent hemodialysis overnight and hemodynamically she stabilized  Discharge Diagnoses:  Principal Problem:   Acute metabolic encephalopathy Active Problems:   Hypertensive emergency   Malignant HTN with heart disease, w/o CHF, 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  Acute metabolic encephalopathy question due to hypertensive emergency in the setting of cocaine use. CT of the head showed no acute findings. Her Lyrica and Remeron were held on admission she could resume them as an outpatient.  Hypertensive emergency: Admitted to the ICU IV drip renal was consulted and she was dialyzed. She was started back on her home regimen and her blood pressure improved. Her Coreg was increased she will continue her current regimen follow-up with nephrology as an outpatient and titrate medications as tolerated.  End-stage renal disease/hyperkalemia: Renal was consulted she was dialyzed no changes made to his medication.  Paroxysmal atrial fibrillation on Eliquis: Continue Coreg and Eliquis  Goals of care: The patient had 9 admission over the last year  due to nonadherence to hemodialysis treatment, medication and cocaine use. Palliative care met with patient and she was made a DNR. We will have to continue this conversation as an outpatient to identify patient goals of care.   Discharge Instructions  Discharge Instructions     Diet - low sodium heart healthy   Complete by: As directed    Increase activity slowly   Complete by: As directed       Allergies as of 12/30/2021       Reactions   Shrimp [shellfish Allergy] Shortness Of Breath   Bactroban [mupirocin] Other (See Comments)   "Sores in nose"   Vicodin [hydrocodone-acetaminophen] Itching, Nausea And Vomiting   This is patient's home medication   Eliquis [apixaban] Itching   Spoke with patient, no rash.  Has tolerated on re-challenge.   Lisinopril Cough   Tylenol [acetaminophen] Itching        Medication List     TAKE these medications    albuterol 108 (90 Base) MCG/ACT inhaler Commonly known as: VENTOLIN HFA Inhale 2 puffs into the lungs every 6 (six) hours as needed for wheezing or shortness of breath.   amLODipine 10 MG tablet Commonly known as: NORVASC Take 1 tablet (10 mg total) by mouth daily.   apixaban 2.5 MG Tabs tablet Commonly known as: ELIQUIS Take 1 tablet (2.5 mg total) by mouth 2 (two) times daily.   carvedilol 6.25 MG tablet Commonly known as: COREG Take 1 tablet (6.25 mg total) by mouth 2 (two) times daily with a meal.   cetirizine 10 MG tablet Commonly known as: ZYRTEC Take 10 mg by mouth daily as needed for allergies.   cloNIDine 0.1 mg/24hr patch Commonly known as: CATAPRES - Dosed in mg/24 hr Place 1 patch (0.1 mg total) onto  the skin every Saturday.   hydrALAZINE 50 MG tablet Commonly known as: APRESOLINE Take 1 tablet (50 mg total) by mouth 3 (three) times daily.   hydrALAZINE 25 MG tablet Commonly known as: APRESOLINE Take 25 mg by mouth 2 (two) times daily.   isosorbide mononitrate 30 MG 24 hr tablet Commonly known as:  IMDUR Take 1 tablet (30 mg total) by mouth daily.   loperamide 2 MG capsule Commonly known as: IMODIUM Take 2 mg by mouth as needed for diarrhea or loose stools.   losartan 100 MG tablet Commonly known as: COZAAR Take 1 tablet (100 mg total) by mouth daily.   mirtazapine 7.5 MG tablet Commonly known as: REMERON Take 1 tablet (7.5 mg total) by mouth at bedtime. What changed:  when to take this reasons to take this   Narcan 4 MG/0.1ML Liqd nasal spray kit Generic drug: naloxone Place 1 spray into the nose as needed for opioid reversal. If no response within 3 minutes then repeat once in the other nostril.  Call 911.   pregabalin 50 MG capsule Commonly known as: LYRICA Take 100 mg by mouth at bedtime.   rosuvastatin 10 MG tablet Commonly known as: CRESTOR Take 1 tablet (10 mg total) by mouth daily.   sevelamer carbonate 2.4 g Pack Commonly known as: RENVELA Take 2.4 g by mouth 3 (three) times daily with meals.   torsemide 20 MG tablet Commonly known as: DEMADEX Take 40 mg by mouth daily.   Velphoro 500 MG chewable tablet Generic drug: sucroferric oxyhydroxide Chew 1,000 mg by mouth 3 (three) times daily with meals.   VITAMIN D PO Take 5,000 Units by mouth daily.        Allergies  Allergen Reactions   Shrimp [Shellfish Allergy] Shortness Of Breath   Bactroban [Mupirocin] Other (See Comments)    "Sores in nose"   Vicodin [Hydrocodone-Acetaminophen] Itching and Nausea And Vomiting    This is patient's home medication   Eliquis [Apixaban] Itching    Spoke with patient, no rash.  Has tolerated on re-challenge.   Lisinopril Cough   Tylenol [Acetaminophen] Itching    Consultations: Nephrology   Procedures/Studies: CT Head Wo Contrast  Result Date: 12/24/2021 CLINICAL DATA:  Mental status change, dialysis patient EXAM: CT HEAD WITHOUT CONTRAST TECHNIQUE: Contiguous axial images were obtained from the base of the skull through the vertex without intravenous  contrast. RADIATION DOSE REDUCTION: This exam was performed according to the departmental dose-optimization program which includes automated exposure control, adjustment of the mA and/or kV according to patient size and/or use of iterative reconstruction technique. COMPARISON:  08/23/2021 FINDINGS: Brain: Stable atrophy pattern and extensive white matter microvascular ischemic changes throughout both cerebral hemispheres. No acute intracranial hemorrhage, mass lesion, midline shift, herniation, or hydrocephalus. No focal mass effect or edema. Cisterns are patent. No cerebellar abnormality. Vascular: No hyperdense vessel or unexpected calcification. Skull: Normal. Negative for fracture or focal lesion. Sinuses/Orbits: No acute finding. Other: None. IMPRESSION: Stable atrophy and white matter microvascular ischemic changes. No acute intracranial abnormality or interval change by noncontrast CT. Electronically Signed   By: Jerilynn Mages.  Shick M.D.   On: 12/24/2021 14:41   DG Chest Port 1 View  Result Date: 12/24/2021 CLINICAL DATA:  Altered mental status EXAM: PORTABLE CHEST 1 VIEW COMPARISON:  12/07/2021 FINDINGS: Transverse diameter of the heart is increased. There is interval decrease in pulmonary vascular congestion and clearing of pulmonary edema. There are no new focal infiltrates. There is no significant pleural effusion or pneumothorax.  Metallic sutures are seen in the sternum. Tip of dialysis catheter is seen in the region of right atrium. IMPRESSION: Cardiomegaly. There are no signs of pulmonary edema or focal pulmonary consolidation. Electronically Signed   By: Elmer Picker M.D.   On: 12/24/2021 14:00   CT Angio Chest/Abd/Pel for Dissection W and/or Wo Contrast  Result Date: 12/07/2021 CLINICAL DATA:  Aortic aneurysm, known or suspected. EXAM: CT ANGIOGRAPHY CHEST, ABDOMEN AND PELVIS TECHNIQUE: Non-contrast CT of the chest was initially obtained. Multidetector CT imaging through the chest, abdomen and  pelvis was performed using the standard protocol during bolus administration of intravenous contrast. Multiplanar reconstructed images and MIPs were obtained and reviewed to evaluate the vascular anatomy. RADIATION DOSE REDUCTION: This exam was performed according to the departmental dose-optimization program which includes automated exposure control, adjustment of the mA and/or kV according to patient size and/or use of iterative reconstruction technique. CONTRAST:  33m OMNIPAQUE IOHEXOL 350 MG/ML SOLN COMPARISON:  06/19/2021. FINDINGS: CTA CHEST FINDINGS Cardiovascular: The heart is enlarged and there is no pericardial effusion. A few scattered coronary artery calcifications are noted. The distal tip of a right internal jugular central venous catheter terminates in the right atrium. There is atherosclerotic calcification of the aorta with mural thrombus. The ascending aorta measures up to 3.7 cm in diameter. Evaluation for dissection is limited due to mixing artifact in the aorta. No definite dissection is seen. The pulmonary trunk is distended suggesting underlying pulmonary artery hypertension. Mediastinum/Nodes: No mediastinal, hilar, or axillary lymphadenopathy by size criteria. The thyroid gland, trachea, and esophagus are within normal limits. Lungs/Pleura: There is a moderate pleural effusion on the right. There is a trace left pleural effusion. Hazy ground-glass opacities are noted in the lungs bilaterally. Strandy atelectasis is noted at the lung bases. No pneumothorax. Musculoskeletal: Sternotomy wires are present over the midline. Degenerative changes are present in the thoracic spine. No acute osseous abnormality. Review of the MIP images confirms the above findings. CTA ABDOMEN AND PELVIS FINDINGS VASCULAR Aorta: Atherosclerotic calcification with mural thrombus. Normal caliber aorta without aneurysm, dissection, vasculitis or significant stenosis. Celiac: Moderate narrowing of the origin of the  celiac artery which may be due to respiratory phase. Patent without evidence of aneurysm, dissection, or vasculitis. SMA: Patent without evidence of aneurysm, dissection, vasculitis or significant stenosis. Renals: Bilateral renal arteries are patent without evidence of aneurysm or dissection. Evaluation of the distal renal arteries is limited due to suboptimal timing of contrast bolus IMA: Patent, not well evaluated due to timing of contrast bolus. Inflow: Atherosclerotic calcification. Patent without evidence of aneurysm, dissection, vasculitis or significant stenosis. Veins: No obvious venous abnormality within the limitations of this arterial phase study. Review of the MIP images confirms the above findings. NON-VASCULAR Hepatobiliary: There is reflux of contrast into the inferior vena cava and hepatic veins. A vague hypodensity is noted in the left lobe of the liver measuring 1.9 cm, axial image 65. The gallbladder is without stones. No biliary ductal dilatation Pancreas: Limited evaluation due to hardware artifact. No pancreatic ductal dilatation or surrounding inflammatory changes. Spleen: Normal in size without focal abnormality. Adrenals/Urinary Tract: The adrenal glands are within normal limits. Renal atrophy is present bilaterally. Evaluation of the kidneys is limited due to hardware artifact. No hydronephrosis. Excreted contrast is present in the urinary bladder and there is minimal distention. Stomach/Bowel: The stomach is within normal limits. No bowel obstruction, free air, or pneumatosis. The individual bowel loops are not well delineated. The appendix is not definitely seen  on exam. Lymphatic: No definite abdominal or pelvic lymphadenopathy. Reproductive: Calcified lesions are present in the uterus likely representing fibroids. No adnexal mass is seen. Other: No ascites.  Anasarca is noted. Musculoskeletal: Degenerative changes are present in the lumbar spine. Fixation hardware at L2 and L3 and  L4-L5. No acute osseous abnormality. Review of the MIP images confirms the above findings. IMPRESSION: 1. No definite evidence of aortic aneurysm. There is heterogeneous enhancement of the aortic arch and descending aorta which may be related to turbulent flow and timing of contrast bolus, less likely dissection. If there is clinical concern for aortic dissection, repeat CT chest may be beneficial for further evaluation. 2. Small right pleural effusion with trace left pleural effusion. 3. Hazy ground-glass opacities in the lungs bilaterally, possible edema or infiltrate. 4. Cardiomegaly with coronary artery calcifications. 5. No acute process in the abdomen and pelvis. 6. Anasarca. 7. Remaining incidental findings as described above. Electronically Signed   By: Brett Fairy M.D.   On: 12/07/2021 22:36   DG Chest 2 View  Result Date: 12/07/2021 CLINICAL DATA:  Shortness of breath.  Missed hemodialysis yesterday. EXAM: CHEST - 2 VIEW COMPARISON:  Radiograph 11/18/2021 FINDINGS: Right-sided dialysis catheter tip in the right atrium. Post median sternotomy. Similar cardiomegaly. Increased pulmonary edema from prior radiograph. Minor scarring in the left mid lung. Small bilateral pleural effusions. Mild fluid in the fissures. No pneumothorax or confluent airspace disease. IMPRESSION: 1. Increased pulmonary edema from prior radiograph. Small bilateral pleural effusions. 2. Similar cardiomegaly. Electronically Signed   By: Keith Rake M.D.   On: 12/07/2021 21:14   (Echo, Carotid, EGD, Colonoscopy, ERCP)    Subjective: No complaints  Discharge Exam: Vitals:   12/30/21 0253 12/30/21 0817  BP: 139/83 (!) 157/79  Pulse: 64 71  Resp: 18 17  Temp: 98.4 F (36.9 C) 97.6 F (36.4 C)  SpO2: 99% 98%   Vitals:   12/29/21 0725 12/29/21 1948 12/30/21 0253 12/30/21 0817  BP: (!) 146/79 103/61 139/83 (!) 157/79  Pulse: 65 64 64 71  Resp: 20 18 18 17   Temp: 98.4 F (36.9 C) 98.6 F (37 C) 98.4 F (36.9  C) 97.6 F (36.4 C)  TempSrc: Oral Oral Oral Oral  SpO2:  98% 99% 98%  Weight:   42.1 kg   Height:        General: Pt is alert, awake, not in acute distress Cardiovascular: RRR, S1/S2 +, no rubs, no gallops Respiratory: CTA bilaterally, no wheezing, no rhonchi Abdominal: Soft, NT, ND, bowel sounds + Extremities: no edema, no cyanosis    The results of significant diagnostics from this hospitalization (including imaging, microbiology, ancillary and laboratory) are listed below for reference.     Microbiology: Recent Results (from the past 240 hour(s))  Resp Panel by RT-PCR (Flu A&B, Covid) Anterior Nasal Swab     Status: None   Collection Time: 12/24/21  1:34 PM   Specimen: Anterior Nasal Swab  Result Value Ref Range Status   SARS Coronavirus 2 by RT PCR NEGATIVE NEGATIVE Final    Comment: (NOTE) SARS-CoV-2 target nucleic acids are NOT DETECTED.  The SARS-CoV-2 RNA is generally detectable in upper respiratory specimens during the acute phase of infection. The lowest concentration of SARS-CoV-2 viral copies this assay can detect is 138 copies/mL. A negative result does not preclude SARS-Cov-2 infection and should not be used as the sole basis for treatment or other patient management decisions. A negative result may occur with  improper specimen collection/handling, submission  of specimen other than nasopharyngeal swab, presence of viral mutation(s) within the areas targeted by this assay, and inadequate number of viral copies(<138 copies/mL). A negative result must be combined with clinical observations, patient history, and epidemiological information. The expected result is Negative.  Fact Sheet for Patients:  EntrepreneurPulse.com.au  Fact Sheet for Healthcare Providers:  IncredibleEmployment.be  This test is no t yet approved or cleared by the [place] FDA and  has been authorized for detection and/or diagnosis of SARS-CoV-2  by FDA under an Emergency Use Authorization (EUA). This EUA will remain  in effect (meaning this test can be used) for the duration of the COVID-19 declaration under Section 564(b)(1) of the Act, 21 U.S.C.section 360bbb-3(b)(1), unless the authorization is terminated  or revoked sooner.       Influenza A by PCR NEGATIVE NEGATIVE Final   Influenza B by PCR NEGATIVE NEGATIVE Final    Comment: (NOTE) The Xpert Xpress SARS-CoV-2/FLU/RSV plus assay is intended as an aid in the diagnosis of influenza from Nasopharyngeal swab specimens and should not be used as a sole basis for treatment. Nasal washings and aspirates are unacceptable for Xpert Xpress SARS-CoV-2/FLU/RSV testing.  Fact Sheet for Patients: EntrepreneurPulse.com.au  Fact Sheet for Healthcare Providers: IncredibleEmployment.be  This test is not yet approved or cleared by the [place] FDA and has been authorized for detection and/or diagnosis of SARS-CoV-2 by FDA under an Emergency Use Authorization (EUA). This EUA will remain in effect (meaning this test can be used) for the duration of the COVID-19 declaration under Section 564(b)(1) of the Act, 21 U.S.C. section 360bbb-3(b)(1), unless the authorization is terminated or revoked.  Performed at [facility], [place] [address], [place], [place] [address]      Labs: BNP (last 3 results) Recent Labs    06/20/21 1808 07/15/21 2029 10/29/21 0840  BNP >4,500.0* >4,500.0* >2,426.8*   Basic Metabolic Panel: Recent Labs  Lab 12/25/21 1357 12/26/21 0602 12/27/21 0054 12/28/21 0443 12/29/21 0650  NA 138 137 139 136 137  K 4.0 4.5 4.1 3.9 4.2  CL 96* 97* 96* 95* 98  CO2 23 25 26 27 24   GLUCOSE 92 90 99 112* 115*  BUN 35* 50* 75* 50* 93*  CREATININE 6.22* 8.11* 10.32* 6.83* 10.08*  CALCIUM 9.3 9.5 9.1 9.3 9.5  PHOS 6.5* 8.8* 10.4* 7.3* 9.3*   Liver Function Tests: Recent Labs  Lab 12/24/21 1341 12/25/21 1357  12/26/21 0602 12/27/21 0054 12/28/21 0443 12/29/21 0650  AST 33  --   --   --   --   --   ALT 32  --   --   --   --   --   ALKPHOS 66  --   --   --   --   --   BILITOT 1.1  --   --   --   --   --   PROT 7.6  --   --   --   --   --   ALBUMIN 4.2 3.5 3.4* 3.3* 3.4* 3.3*   No results for input(s): "LIPASE", "AMYLASE" in the last 168 hours. Recent Labs  Lab 12/24/21 1341  AMMONIA 26   CBC: Recent Labs  Lab 12/24/21 1341 12/24/21 1353 12/26/21 0600  WBC 4.8  --  4.5  NEUTROABS 4.2  --   --   HGB 11.4* 12.9 12.5  HCT 36.5 38.0 38.0  MCV 94.6  --  90.9  PLT 244  --  238  Cardiac Enzymes: No results for input(s): "CKTOTAL", "CKMB", "CKMBINDEX", "TROPONINI" in the last 168 hours. BNP: Invalid input(s): "POCBNP" CBG: Recent Labs  Lab 12/24/21 1315  GLUCAP 100*   D-Dimer No results for input(s): "DDIMER" in the last 72 hours. Hgb A1c No results for input(s): "HGBA1C" in the last 72 hours. Lipid Profile No results for input(s): "CHOL", "HDL", "LDLCALC", "TRIG", "CHOLHDL", "LDLDIRECT" in the last 72 hours. Thyroid function studies No results for input(s): "TSH", "T4TOTAL", "T3FREE", "THYROIDAB" in the last 72 hours.  Invalid input(s): "FREET3" Anemia work up No results for input(s): "VITAMINB12", "FOLATE", "FERRITIN", "TIBC", "IRON", "RETICCTPCT" in the last 72 hours. Urinalysis    Component Value Date/Time   COLORURINE AMBER (A) 12/27/2021 1840   APPEARANCEUR TURBID (A) 12/27/2021 1840   LABSPEC 1.013 12/27/2021 1840   PHURINE 8.0 12/27/2021 1840   GLUCOSEU NEGATIVE 12/27/2021 1840   GLUCOSEU NEG mg/dL 10/30/2009 1944   HGBUR MODERATE (A) 12/27/2021 1840   BILIRUBINUR NEGATIVE 12/27/2021 1840   BILIRUBINUR negative 05/04/2018 0910   KETONESUR NEGATIVE 12/27/2021 1840   PROTEINUR >=300 (A) 12/27/2021 1840   UROBILINOGEN 0.2 05/04/2018 0910   UROBILINOGEN 1 02/14/2014 0946   NITRITE NEGATIVE 12/27/2021 1840   LEUKOCYTESUR LARGE (A) 12/27/2021 1840   Sepsis  Labs Recent Labs  Lab 12/24/21 1341 12/26/21 0600  WBC 4.8 4.5   Microbiology Recent Results (from the past 240 hour(s))  Resp Panel by RT-PCR (Flu A&B, Covid) Anterior Nasal Swab     Status: None   Collection Time: 12/24/21  1:34 PM   Specimen: Anterior Nasal Swab  Result Value Ref Range Status   SARS Coronavirus 2 by RT PCR NEGATIVE NEGATIVE Final    Comment: (NOTE) SARS-CoV-2 target nucleic acids are NOT DETECTED.  The SARS-CoV-2 RNA is generally detectable in upper respiratory specimens during the acute phase of infection. The lowest concentration of SARS-CoV-2 viral copies this assay can detect is 138 copies/mL. A negative result does not preclude SARS-Cov-2 infection and should not be used as the sole basis for treatment or other patient management decisions. A negative result may occur with  improper specimen collection/handling, submission of specimen other than nasopharyngeal swab, presence of viral mutation(s) within the areas targeted by this assay, and inadequate number of viral copies(<138 copies/mL). A negative result must be combined with clinical observations, patient history, and epidemiological information. The expected result is Negative.  Fact Sheet for Patients:  EntrepreneurPulse.com.au  Fact Sheet for Healthcare Providers:  IncredibleEmployment.be  This test is no t yet approved or cleared by the [place] FDA and  has been authorized for detection and/or diagnosis of SARS-CoV-2 by FDA under an Emergency Use Authorization (EUA). This EUA will remain  in effect (meaning this test can be used) for the duration of the COVID-19 declaration under Section 564(b)(1) of the Act, 21 U.S.C.section 360bbb-3(b)(1), unless the authorization is terminated  or revoked sooner.       Influenza A by PCR NEGATIVE NEGATIVE Final   Influenza B by PCR NEGATIVE NEGATIVE Final    Comment: (NOTE) The Xpert Xpress SARS-CoV-2/FLU/RSV  plus assay is intended as an aid in the diagnosis of influenza from Nasopharyngeal swab specimens and should not be used as a sole basis for treatment. Nasal washings and aspirates are unacceptable for Xpert Xpress SARS-CoV-2/FLU/RSV testing.  Fact Sheet for Patients: EntrepreneurPulse.com.au  Fact Sheet for Healthcare Providers: IncredibleEmployment.be  This test is not yet approved or cleared by the [place] FDA and has been authorized for detection and/or diagnosis  of SARS-CoV-2 by FDA under an Emergency Use Authorization (EUA). This EUA will remain in effect (meaning this test can be used) for the duration of the COVID-19 declaration under Section 564(b)(1) of the Act, 21 U.S.C. section 360bbb-3(b)(1), unless the authorization is terminated or revoked.  Performed at [facility], [place] [address] [place], [place] [address]     SIGNED:   Charlynne Cousins, MD  [facility] 12/30/2021, 9:40 AM Pager   If 7PM-7AM, please contact night-coverage [email or web address] Password [email or web address]

## 2021-12-30 NOTE — TOC Transition Note (Signed)
Transition of Care [facility]) - CM/SW Discharge Note   Patient Details  Name: Monique Henderson MRN: [id number] Date of Birth: [birth year]  [facility]) CM/SW Contact:  Zenon Mayo, RN Phone Number: 12/30/2021, 10:24 AM   Clinical Narrative:    NCM spoke with patient at bedside, she states her ride is on the way to pick her up. She is indep. NCM asked who would she like to do outpatient PT with, she states she does not need this , she says I am good.  She has no needs.          Patient Goals and CMS Choice        Discharge Placement                       Discharge Plan and Services                                     Social Determinants of Health (SDOH) Interventions     Readmission Risk Interventions    05/22/2019    4:31 PM  Readmission Risk Prevention Plan  Transportation Screening Complete  PCP or Specialist Appt within 3-5 Days Complete  HRI or [place] Complete  Social Work Consult for [place] Planning/Counseling Complete  Palliative Care Screening Not Applicable  Medication Review [employer]) Complete

## 2021-12-30 NOTE — Plan of Care (Signed)
  Problem: Education: Goal: Knowledge of General Education information will improve Description: Including pain rating scale, medication(s)/side effects and non-pharmacologic comfort measures Outcome: Adequate for Discharge   Problem: Health Behavior/Discharge Planning: Goal: Ability to manage health-related needs will improve Outcome: Adequate for Discharge   Problem: Clinical Measurements: Goal: Ability to maintain clinical measurements within normal limits will improve Outcome: Adequate for Discharge Goal: Will remain free from infection Outcome: Adequate for Discharge Goal: Diagnostic test results will improve Outcome: Adequate for Discharge Goal: Respiratory complications will improve Outcome: Adequate for Discharge Goal: Cardiovascular complication will be avoided Outcome: Adequate for Discharge   Problem: Activity: Goal: Risk for activity intolerance will decrease Outcome: Adequate for Discharge   Problem: Nutrition: Goal: Adequate nutrition will be maintained Outcome: Adequate for Discharge   Problem: Coping: Goal: Level of anxiety will decrease Outcome: Adequate for Discharge   Problem: Elimination: Goal: Will not experience complications related to bowel motility Outcome: Adequate for Discharge Goal: Will not experience complications related to urinary retention Outcome: Adequate for Discharge   Problem: Pain Managment: Goal: General experience of comfort will improve Outcome: Adequate for Discharge   Problem: Safety: Goal: Ability to remain free from injury will improve Outcome: Adequate for Discharge   Problem: Skin Integrity: Goal: Risk for impaired skin integrity will decrease Outcome: Adequate for Discharge   Problem: Acute Rehab OT Goals (only OT should resolve) Goal: Pt. Will Perform Upper Body Bathing Outcome: Adequate for Discharge Goal: Pt. Will Perform Lower Body Bathing Outcome: Adequate for Discharge Goal: Pt. Will Transfer To  Toilet Outcome: Adequate for Discharge Goal: Pt. Will Perform Tub/Shower Transfer Outcome: Adequate for Discharge   Problem: Acute Rehab PT Goals(only PT should resolve) Goal: Pt Will Go Sit To Supine/Side Outcome: Adequate for Discharge Goal: Patient Will Transfer Sit To/From Stand Outcome: Adequate for Discharge Goal: Pt Will Ambulate Outcome: Adequate for Discharge Goal: Pt Will Go Up/Down Stairs Outcome: Adequate for Discharge

## 2021-12-31 ENCOUNTER — Other Ambulatory Visit: Payer: Self-pay

## 2021-12-31 NOTE — Patient Outreach (Signed)
  Care Coordination [facility] Note Transition Care Management Follow-up Telephone Call Date of discharge and from where: 12/30/21 Monique Henderson How have you been since you were released from the hospital? Doing just fine Any questions or concerns? No  Items Reviewed: Did the pt receive and understand the discharge instructions provided? Yes  Medications obtained and verified? Yes  Other? No  Any new allergies since your discharge? No  Dietary orders reviewed? Yes Do you have support at home? No   Home Care and Equipment/Supplies: Were home health services ordered? no If so, what is the name of the agency? N/A  Has the agency set up a time to come to the patient's home? not applicable Were any new equipment or medical supplies ordered?  No What is the name of the medical supply agency? N/A Were you able to get the supplies/equipment? not applicable Do you have any questions related to the use of the equipment or supplies? No  Functional Questionnaire: (I = Independent and D = Dependent) ADLs: I  Bathing/Dressing- I  Meal Prep- I  Eating- I  Maintaining continence- I  Transferring/Ambulation- I  Managing Meds- I  Follow up appointments reviewed:  PCP Hospital f/u appt confirmed? No  Scheduled to see Lazaro Arms on 02/06/22 @ 2 PM. [facility] f/u appt confirmed? Yes  Scheduled to see followed at dialysis on 01/01/22 Are transportation arrangements needed? No  If their condition worsens, is the pt aware to call PCP or go to the Emergency Dept.? Yes Was the patient provided with contact information for the PCP's office or ED? Yes Was to pt encouraged to call back with questions or concerns? Yes  SDOH assessments and interventions completed:   Yes  Care Coordination Interventions Activated:  Yes   Care Coordination Interventions:  Referred for Care Coordination Services:  RN Care Coordinator   PCP follow up appointment requested  Encounter Outcome:  Pt. Visit Completed    Peter Garter RN, BSN,CCM, CDE Care Management Coordinator [facility] [phone]

## 2022-01-02 ENCOUNTER — Ambulatory Visit: Payer: Self-pay

## 2022-01-02 NOTE — Patient Outreach (Signed)
  Care Coordination   01/02/2022 Name: Monique Henderson MRN: [id number] DOB: [birth year]   Care Coordination Outreach Attempts:  An unsuccessful telephone outreach was attempted today to offer the patient information about available care coordination services as a benefit of their health plan.   Follow Up Plan:  Additional outreach attempts will be made to offer the patient care coordination information and services.   Encounter Outcome:  No Answer Unable to leave a message Care Coordination Interventions Activated:  No   Care Coordination Interventions:  No, not indicated    Lazaro Arms RN, BSN, [facility]   Phone: [phone]

## 2022-01-05 ENCOUNTER — Telehealth: Payer: Self-pay

## 2022-01-05 NOTE — Telephone Encounter (Signed)
TCM call completed 01/02/2022 and patient has been scheduled for a sooner appt (01/16/2022).   Call made to patient but no vm to leave a message.   I have sent an SMS notification for patient to call my direct line so I can inform of the sooner appt.

## 2022-01-06 ENCOUNTER — Inpatient Hospital Stay (HOSPITAL_COMMUNITY)
Admission: EM | Admit: 2022-01-06 | Discharge: 2022-01-10 | DRG: 304 | Disposition: A | Discharge status: Home / Self Care | Payer: Medicare Other | Attending: Internal Medicine | Admitting: Internal Medicine

## 2022-01-06 ENCOUNTER — Other Ambulatory Visit: Payer: Self-pay

## 2022-01-06 ENCOUNTER — Encounter (HOSPITAL_COMMUNITY): Payer: Self-pay | Admitting: Emergency Medicine

## 2022-01-06 ENCOUNTER — Emergency Department (HOSPITAL_COMMUNITY): Payer: Medicare Other

## 2022-01-06 DIAGNOSIS — G8929 Other chronic pain: Secondary | ICD-10-CM | POA: Diagnosis present

## 2022-01-06 DIAGNOSIS — Z66 Do not resuscitate: Secondary | ICD-10-CM | POA: Diagnosis present

## 2022-01-06 DIAGNOSIS — Z91158 Patient's noncompliance with renal dialysis for other reason: Secondary | ICD-10-CM

## 2022-01-06 DIAGNOSIS — Z91148 Patient's other noncompliance with medication regimen for other reason: Secondary | ICD-10-CM

## 2022-01-06 DIAGNOSIS — F17200 Nicotine dependence, unspecified, uncomplicated: Secondary | ICD-10-CM | POA: Diagnosis present

## 2022-01-06 DIAGNOSIS — Z20822 Contact with and (suspected) exposure to covid-19: Secondary | ICD-10-CM | POA: Diagnosis present

## 2022-01-06 DIAGNOSIS — K219 Gastro-esophageal reflux disease without esophagitis: Secondary | ICD-10-CM | POA: Diagnosis present

## 2022-01-06 DIAGNOSIS — Z79899 Other long term (current) drug therapy: Secondary | ICD-10-CM

## 2022-01-06 DIAGNOSIS — Z7901 Long term (current) use of anticoagulants: Secondary | ICD-10-CM

## 2022-01-06 DIAGNOSIS — F1910 Other psychoactive substance abuse, uncomplicated: Secondary | ICD-10-CM

## 2022-01-06 DIAGNOSIS — D631 Anemia in chronic kidney disease: Secondary | ICD-10-CM | POA: Diagnosis present

## 2022-01-06 DIAGNOSIS — Z992 Dependence on renal dialysis: Secondary | ICD-10-CM

## 2022-01-06 DIAGNOSIS — R109 Unspecified abdominal pain: Secondary | ICD-10-CM

## 2022-01-06 DIAGNOSIS — Z820 Family history of epilepsy and other diseases of the nervous system: Secondary | ICD-10-CM

## 2022-01-06 DIAGNOSIS — Z86000 Personal history of in-situ neoplasm of breast: Secondary | ICD-10-CM

## 2022-01-06 DIAGNOSIS — E8770 Fluid overload, unspecified: Secondary | ICD-10-CM | POA: Diagnosis present

## 2022-01-06 DIAGNOSIS — Z515 Encounter for palliative care: Secondary | ICD-10-CM

## 2022-01-06 DIAGNOSIS — J449 Chronic obstructive pulmonary disease, unspecified: Secondary | ICD-10-CM | POA: Diagnosis present

## 2022-01-06 DIAGNOSIS — Z87442 Personal history of urinary calculi: Secondary | ICD-10-CM

## 2022-01-06 DIAGNOSIS — Z8679 Personal history of other diseases of the circulatory system: Secondary | ICD-10-CM

## 2022-01-06 DIAGNOSIS — I169 Hypertensive crisis, unspecified: Secondary | ICD-10-CM | POA: Diagnosis present

## 2022-01-06 DIAGNOSIS — Z9889 Other specified postprocedural states: Secondary | ICD-10-CM

## 2022-01-06 DIAGNOSIS — Z8249 Family history of ischemic heart disease and other diseases of the circulatory system: Secondary | ICD-10-CM

## 2022-01-06 DIAGNOSIS — N186 End stage renal disease: Secondary | ICD-10-CM

## 2022-01-06 DIAGNOSIS — I5043 Acute on chronic combined systolic (congestive) and diastolic (congestive) heart failure: Secondary | ICD-10-CM

## 2022-01-06 DIAGNOSIS — I161 Hypertensive emergency: Principal | ICD-10-CM | POA: Diagnosis present

## 2022-01-06 DIAGNOSIS — I10 Essential (primary) hypertension: Secondary | ICD-10-CM | POA: Diagnosis present

## 2022-01-06 DIAGNOSIS — G9341 Metabolic encephalopathy: Secondary | ICD-10-CM | POA: Diagnosis not present

## 2022-01-06 DIAGNOSIS — Z981 Arthrodesis status: Secondary | ICD-10-CM

## 2022-01-06 DIAGNOSIS — E875 Hyperkalemia: Secondary | ICD-10-CM | POA: Diagnosis not present

## 2022-01-06 DIAGNOSIS — I132 Hypertensive heart and chronic kidney disease with heart failure and with stage 5 chronic kidney disease, or end stage renal disease: Secondary | ICD-10-CM | POA: Diagnosis present

## 2022-01-06 DIAGNOSIS — I160 Hypertensive urgency: Secondary | ICD-10-CM | POA: Diagnosis not present

## 2022-01-06 DIAGNOSIS — Z8619 Personal history of other infectious and parasitic diseases: Secondary | ICD-10-CM

## 2022-01-06 DIAGNOSIS — I5042 Chronic combined systolic (congestive) and diastolic (congestive) heart failure: Secondary | ICD-10-CM | POA: Diagnosis present

## 2022-01-06 DIAGNOSIS — Z8673 Personal history of transient ischemic attack (TIA), and cerebral infarction without residual deficits: Secondary | ICD-10-CM

## 2022-01-06 DIAGNOSIS — Z833 Family history of diabetes mellitus: Secondary | ICD-10-CM

## 2022-01-06 DIAGNOSIS — Z800 Family history of malignant neoplasm of digestive organs: Secondary | ICD-10-CM

## 2022-01-06 DIAGNOSIS — E872 Acidosis, unspecified: Secondary | ICD-10-CM | POA: Diagnosis present

## 2022-01-06 DIAGNOSIS — I480 Paroxysmal atrial fibrillation: Secondary | ICD-10-CM | POA: Diagnosis present

## 2022-01-06 LAB — COMPREHENSIVE METABOLIC PANEL
ALT: 22 U/L (ref 0–44)
AST: 21 U/L (ref 15–41)
Albumin: 3.7 g/dL (ref 3.5–5.0)
Alkaline Phosphatase: 102 U/L (ref 38–126)
Anion gap: 17 — ABNORMAL HIGH (ref 5–15)
BUN: 114 mg/dL — ABNORMAL HIGH (ref 8–23)
CO2: 21 mmol/L — ABNORMAL LOW (ref 22–32)
Calcium: 9.7 mg/dL (ref 8.9–10.3)
Chloride: 102 mmol/L (ref 98–111)
Creatinine, Ser: 12.75 mg/dL — ABNORMAL HIGH (ref 0.44–1.00)
GFR, Estimated: 3 mL/min — ABNORMAL LOW (ref >60)
Glucose, Bld: 106 mg/dL — ABNORMAL HIGH (ref 70–99)
Potassium: 7.2 mmol/L (ref 3.5–5.1)
Sodium: 140 mmol/L (ref 135–145)
Total Bilirubin: 0.8 mg/dL (ref 0.3–1.2)
Total Protein: 6.9 g/dL (ref 6.5–8.1)

## 2022-01-06 LAB — TROPONIN I (HIGH SENSITIVITY)
Troponin I (High Sensitivity): 280 ng/L (ref <18)
Troponin I (High Sensitivity): 290 ng/L (ref <18)

## 2022-01-06 LAB — CBC WITH DIFFERENTIAL/PLATELET
Abs Immature Granulocytes: 0.01 10*3/uL (ref 0.00–0.07)
Basophils Absolute: 0.1 10*3/uL (ref 0.0–0.1)
Basophils Relative: 1 %
Eosinophils Absolute: 0.2 10*3/uL (ref 0.0–0.5)
Eosinophils Relative: 4 %
HCT: 37.4 % (ref 36.0–46.0)
Hemoglobin: 12.3 g/dL (ref 12.0–15.0)
Immature Granulocytes: 0 %
Lymphocytes Relative: 9 %
Lymphs Abs: 0.5 10*3/uL — ABNORMAL LOW (ref 0.7–4.0)
MCH: 30.4 pg (ref 26.0–34.0)
MCHC: 32.9 g/dL (ref 30.0–36.0)
MCV: 92.6 fL (ref 80.0–100.0)
Monocytes Absolute: 0.4 10*3/uL (ref 0.1–1.0)
Monocytes Relative: 8 %
Neutro Abs: 3.8 10*3/uL (ref 1.7–7.7)
Neutrophils Relative %: 78 %
Platelets: 411 10*3/uL — ABNORMAL HIGH (ref 150–400)
RBC: 4.04 MIL/uL (ref 3.87–5.11)
RDW: 21 % — ABNORMAL HIGH (ref 11.5–15.5)
WBC: 4.9 10*3/uL (ref 4.0–10.5)
nRBC: 0 % (ref 0.0–0.2)

## 2022-01-06 LAB — BASIC METABOLIC PANEL
Anion gap: 16 — ABNORMAL HIGH (ref 5–15)
BUN: 33 mg/dL — ABNORMAL HIGH (ref 8–23)
CO2: 26 mmol/L (ref 22–32)
Calcium: 9.3 mg/dL (ref 8.9–10.3)
Chloride: 95 mmol/L — ABNORMAL LOW (ref 98–111)
Creatinine, Ser: 5.13 mg/dL — ABNORMAL HIGH (ref 0.44–1.00)
GFR, Estimated: 9 mL/min — ABNORMAL LOW (ref >60)
Glucose, Bld: 77 mg/dL (ref 70–99)
Potassium: 2.9 mmol/L — ABNORMAL LOW (ref 3.5–5.1)
Sodium: 137 mmol/L (ref 135–145)

## 2022-01-06 LAB — SARS CORONAVIRUS 2 BY RT PCR: SARS Coronavirus 2 by RT PCR: NEGATIVE

## 2022-01-06 LAB — POTASSIUM: Potassium: 3.5 mmol/L (ref 3.5–5.1)

## 2022-01-06 LAB — LIPASE, BLOOD: Lipase: 79 U/L — ABNORMAL HIGH (ref 11–51)

## 2022-01-06 MED ORDER — AMLODIPINE BESYLATE 10 MG PO TABS
10.0000 mg | ORAL_TABLET | Freq: Every day | ORAL | Status: DC
  Administered 2022-01-06 – 2022-01-10 (×5): 10 mg via ORAL
  Filled 2022-01-06 (×5): qty 1

## 2022-01-06 MED ORDER — HYDRALAZINE HCL 25 MG PO TABS
25.0000 mg | ORAL_TABLET | Freq: Two times a day (BID) | ORAL | Status: DC
  Administered 2022-01-06 – 2022-01-09 (×6): 25 mg via ORAL
  Filled 2022-01-06 (×7): qty 1

## 2022-01-06 MED ORDER — HYDRALAZINE HCL 20 MG/ML IJ SOLN
10.0000 mg | Freq: Once | INTRAMUSCULAR | Status: AC
  Administered 2022-01-06: 10 mg via INTRAVENOUS
  Filled 2022-01-06: qty 1

## 2022-01-06 MED ORDER — CHLORHEXIDINE GLUCONATE CLOTH 2 % EX PADS
6.0000 | MEDICATED_PAD | Freq: Every day | CUTANEOUS | Status: DC
Start: 2022-01-07 — End: 2022-01-10
  Administered 2022-01-07 – 2022-01-09 (×2): 6 via TOPICAL

## 2022-01-06 MED ORDER — ALBUTEROL SULFATE (2.5 MG/3ML) 0.083% IN NEBU
10.0000 mg | INHALATION_SOLUTION | Freq: Once | RESPIRATORY_TRACT | Status: AC
Start: 2022-01-06 — End: 2022-01-06
  Administered 2022-01-06: 10 mg via RESPIRATORY_TRACT
  Filled 2022-01-06: qty 12

## 2022-01-06 MED ORDER — HYDRALAZINE HCL 20 MG/ML IJ SOLN: 10.0000 mg | INTRAMUSCULAR | Status: DC

## 2022-01-06 MED ORDER — SODIUM CHLORIDE 0.9% FLUSH: 3.0000 mL | INTRAVENOUS | Status: DC | PRN

## 2022-01-06 MED ORDER — CARVEDILOL 6.25 MG PO TABS
6.2500 mg | ORAL_TABLET | Freq: Two times a day (BID) | ORAL | Status: DC
Start: 2022-01-06 — End: 2022-01-06

## 2022-01-06 MED ORDER — ACETAMINOPHEN 650 MG RE SUPP: 650.0000 mg | Freq: Four times a day (QID) | RECTAL | Status: DC | PRN

## 2022-01-06 MED ORDER — ACETAMINOPHEN 325 MG PO TABS
650.0000 mg | ORAL_TABLET | Freq: Four times a day (QID) | ORAL | Status: DC | PRN
  Administered 2022-01-06 – 2022-01-10 (×8): 650 mg via ORAL
  Filled 2022-01-06 (×8): qty 2

## 2022-01-06 MED ORDER — LABETALOL HCL 5 MG/ML IV SOLN
10.0000 mg | Freq: Once | INTRAVENOUS | Status: AC
  Administered 2022-01-06: 10 mg via INTRAVENOUS
  Filled 2022-01-06: qty 4

## 2022-01-06 MED ORDER — LABETALOL HCL 5 MG/ML IV SOLN
10.0000 mg | Freq: Once | INTRAVENOUS | Status: AC
Start: 2022-01-06 — End: 2022-01-06
  Administered 2022-01-06: 10 mg via INTRAVENOUS
  Filled 2022-01-06: qty 4

## 2022-01-06 MED ORDER — INSULIN ASPART 100 UNIT/ML IV SOLN
5.0000 [IU] | Freq: Once | INTRAVENOUS | Status: AC
  Administered 2022-01-06: 5 [IU] via INTRAVENOUS

## 2022-01-06 MED ORDER — DIPHENHYDRAMINE HCL 25 MG PO CAPS
25.0000 mg | ORAL_CAPSULE | Freq: Once | ORAL | Status: AC
Start: 2022-01-06 — End: 2022-01-08
  Administered 2022-01-08: 25 mg via ORAL
  Filled 2022-01-06: qty 1

## 2022-01-06 MED ORDER — HEPARIN SODIUM (PORCINE) 1000 UNIT/ML IJ SOLN
INTRAMUSCULAR | Status: AC
  Filled 2022-01-06: qty 1

## 2022-01-06 MED ORDER — NICOTINE 14 MG/24HR TD PT24
14.0000 mg | MEDICATED_PATCH | Freq: Every day | TRANSDERMAL | Status: DC
  Administered 2022-01-07 – 2022-01-10 (×4): 14 mg via TRANSDERMAL
  Filled 2022-01-06 (×5): qty 1

## 2022-01-06 MED ORDER — ROSUVASTATIN CALCIUM 5 MG PO TABS
10.0000 mg | ORAL_TABLET | Freq: Every day | ORAL | Status: DC
  Administered 2022-01-06 – 2022-01-10 (×5): 10 mg via ORAL
  Filled 2022-01-06 (×5): qty 2

## 2022-01-06 MED ORDER — APIXABAN 2.5 MG PO TABS
2.5000 mg | ORAL_TABLET | Freq: Two times a day (BID) | ORAL | Status: DC
  Administered 2022-01-06 – 2022-01-10 (×7): 2.5 mg via ORAL
  Filled 2022-01-06 (×7): qty 1

## 2022-01-06 MED ORDER — SUCROFERRIC OXYHYDROXIDE 500 MG PO CHEW
1000.0000 mg | CHEWABLE_TABLET | Freq: Two times a day (BID) | ORAL | Status: DC
  Administered 2022-01-07 – 2022-01-09 (×2): 1000 mg via ORAL
  Filled 2022-01-06 (×9): qty 2

## 2022-01-06 MED ORDER — HEPARIN SODIUM (PORCINE) 1000 UNIT/ML IJ SOLN
INTRAMUSCULAR | Status: AC
  Administered 2022-01-06: 4000 [IU]
  Filled 2022-01-06: qty 3

## 2022-01-06 MED ORDER — ISOSORBIDE MONONITRATE ER 30 MG PO TB24
30.0000 mg | ORAL_TABLET | Freq: Every day | ORAL | Status: DC
  Administered 2022-01-06 – 2022-01-09 (×3): 30 mg via ORAL
  Filled 2022-01-06 (×3): qty 1

## 2022-01-06 MED ORDER — SODIUM CHLORIDE 0.9 % IV SOLN: 250.0000 mL | INTRAVENOUS | Status: DC | PRN

## 2022-01-06 MED ORDER — CALCIUM GLUCONATE-NACL 1-0.675 GM/50ML-% IV SOLN
1.0000 g | Freq: Once | INTRAVENOUS | Status: AC
  Administered 2022-01-06: 1000 mg via INTRAVENOUS
  Filled 2022-01-06: qty 50

## 2022-01-06 MED ORDER — SODIUM CHLORIDE 0.9% FLUSH
3.0000 mL | Freq: Two times a day (BID) | INTRAVENOUS | Status: DC
  Administered 2022-01-06 – 2022-01-10 (×4): 3 mL via INTRAVENOUS

## 2022-01-06 MED ORDER — HYDRALAZINE HCL 20 MG/ML IJ SOLN
10.0000 mg | Freq: Three times a day (TID) | INTRAMUSCULAR | Status: DC | PRN
  Administered 2022-01-06 – 2022-01-10 (×5): 10 mg via INTRAVENOUS
  Filled 2022-01-06 (×5): qty 1

## 2022-01-06 MED ORDER — TORSEMIDE 20 MG PO TABS: 20.0000 mg | ORAL_TABLET | Freq: Two times a day (BID) | ORAL | Status: DC

## 2022-01-06 MED ORDER — CALCIUM GLUCONATE 10 % IV SOLN
1.0000 g | Freq: Once | INTRAVENOUS | Status: DC
  Filled 2022-01-06: qty 10

## 2022-01-06 MED ORDER — CARVEDILOL 6.25 MG PO TABS
6.2500 mg | ORAL_TABLET | Freq: Two times a day (BID) | ORAL | Status: DC
  Administered 2022-01-07 – 2022-01-10 (×6): 6.25 mg via ORAL
  Filled 2022-01-06 (×5): qty 1

## 2022-01-06 MED ORDER — DEXTROSE 50 % IV SOLN
1.0000 | Freq: Once | INTRAVENOUS | Status: AC
  Administered 2022-01-06: 50 mL via INTRAVENOUS
  Filled 2022-01-06: qty 50

## 2022-01-06 NOTE — ED Triage Notes (Signed)
Patient from home bib [facility] w/ complaints of generalized abdominal pain, chest pain bilaterally when coughing and nausea. Pt missed dialysis of Saturday. Last completed dialysis on last Thursday with a full session. Pt hypertensive with EMS:  BP:208/134  SPO2- 94% HR 90s  RR 18

## 2022-01-06 NOTE — Assessment & Plan Note (Signed)
Rate controlled, continue eliquis and coreg once uds results

## 2022-01-06 NOTE — Assessment & Plan Note (Signed)
History of aortic repair with HTN urgency/emergency.  EDP had discussion with patient in regards to repeating CTA. She just had one done on 7/9. Through shared decision making they opted not to repeat imaging at this time.  Chest pain has improved and blood pressure is now <893 systolic while I'm seeing her in HD Follow closely in progressive, low threshold to image if worsening pressures or pain

## 2022-01-06 NOTE — H&P (Signed)
History and Physical    Patient: Monique Henderson QPY:[id number] DOB: 1951/05/25 DOA: 01/06/2022 DOS: the patient was seen and examined on 01/06/2022 PCP: Pcp, No  Patient coming from: Home - lives with her friend. Uses cane at times.    Chief Complaint: shortness of breath/chest pain and abdominal pain   HPI: Monique Henderson is a 71 y.o. female with medical history significant of ESRD on HD TTS, refractory HTN, PAF on Eliquis, chronic HFrEF, noncompliance, history of polysubstance abuse, aortic aneurysm and dissection status post repair who presented to ED with complaints of generalized abdominal pain, chest pain with coughing and nausea. She missed her dialysis on Saturday and last session on Thursday. She states she started to feel bad today with chest pain in center of her chest and worsening shortness of breath. She has generalized stomach pain, but normal bowel movements. She denies any recent illness, fever, N/V/D. Chest pain does not radiate. She has no vision changes or headaches.   Just admitted 7/26-8/1 for acute metabolic encephalopathy from HTN emergency. Hx of cocaine use, but UDS negative. Coreg increased and continued on home regimen. She has not had her medication today.   She does smoke, does not drink. Denies recent cocaine use.   Denies any fever/chills, vision changes/headaches  palpitations,  N/V/D, dysuria or leg swelling.    ER Course:  vitals: afebrile, bp: 200/134, HR: 79, RR: 21, oxygen: 94% RA Pertinent labs: potassium: 7.2, BUN: 114, creatinine: 12.75, lipase: 79, troponin 280>pending CXR: minimal bibasilar subsegmental atelectasis vs. Pulmonary edema.  In ED: nephrology consulted. Given calcium gluconate, insulin/dextrose, labetalol and hydralazine. TRH asked to admit.    Review of Systems: As mentioned in the history of present illness. All other systems reviewed and are negative. Past Medical History:  Diagnosis Date   AF (paroxysmal atrial fibrillation)  ([place]) 05/29/2019   on Coumadin   Aortic atherosclerosis ([place]) 07/05/2019   Aortic dissection ([place]) 04/04/2019   s/p repair   Bone spur 2008   Right calcaneal foot spur   Breast cancer ([place]) 2004   Ductal carcinoma in situ of the left breast; S/P left partial mastectomy 02/26/2003; S/P re-excision of cranial and lateral margins11/18/2004.radiation   Cerebral thrombosis with cerebral infarction 05/22/2019   Chronic low back pain 06/22/2016   Chronic obstructive lung disease ([place]) 01/16/2017   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 Autumn Messing; S/P re-excision of superior and inferior margins 10/27/2012.    ESRD on hemodialysis ([place]) 05/29/2019   Essential hypertension 09/16/2006   GERD 09/16/2006   Hepatitis C    treated and RNA confirmed not detectable 01/2017   Insomnia 03/14/2015   Malnutrition of moderate degree 05/19/2019   Non compliance with medical treatment 12/04/2017   Normocytic anemia    With thrombocytosis   Osteoarthritis    Right ureteral stone 2002   S/P lumbar spinal fusion 01/18/2014   S/P lumbar decompressive laminectomy, fusion, and plating for lumbar spinal stenosis on 05/27/2009 by Dr. Eustace Moore.  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 Eustace Moore.     Tobacco use disorder 04/19/2009   Uterine fibroid    Wears dentures    top   Past Surgical History:  Procedure Laterality Date   ANTERIOR LAT LUMBAR FUSION N/A 01/18/2014  Procedure: ANTERIOR LATERAL LUMBAR FUSION LUMBAR TWO-THREE;  Surgeon: Eustace Moore, MD;  Location: [place] [facility];  Service: Neurosurgery;  Laterality: N/A;   ANTERIOR LUMBAR FUSION  01/18/2014   AV FISTULA PLACEMENT Left 04/20/2019   Procedure: ARTERIOVENOUS (AV) FISTULA CREATION;   Surgeon: [name], MD;  Location: [place];  Service: Vascular;  Laterality: Left;   BACK SURGERY     BREAST LUMPECTOMY Left 01/2003   BREAST LUMPECTOMY Right 2014   BREAST LUMPECTOMY WITH NEEDLE LOCALIZATION AND AXILLARY SENTINEL LYMPH NODE BX Right 10/13/2012   Procedure: BREAST LUMPECTOMY WITH NEEDLE LOCALIZATION;  Surgeon: [name], MD;  Location: [place];  Service: General;  Laterality: Right;  Right breast wire localized lumpectomy   INSERTION OF DIALYSIS CATHETER Right 04/20/2019   Procedure: INSERTION OF DIALYSIS CATHETER, right internal jugular;  Surgeon: [name], MD;  Location: [place];  Service: Vascular;  Laterality: Right;   INSERTION OF DIALYSIS CATHETER Right 09/24/2020   Procedure: INSERTION OF TUNNELED DIALYSIS CATHETER;  Surgeon: Rosetta Posner, MD;  Location: [place];  Service: Vascular;  Laterality: Right;   IR FLUORO GUIDE CV LINE RIGHT  09/22/2020   IR THORACENTESIS ASP PLEURAL SPACE W/IMG GUIDE  05/19/2019   IR US GUIDE VASC ACCESS LEFT  09/22/2020   IR US GUIDE VASC ACCESS RIGHT  09/22/2020   IR VENOCAVAGRAM SVC  09/22/2020   LAMINECTOMY  05/27/2009   Lumbar decompressive laminectomy, fusion and plating for lumbar spinal stensosis   LIGATION OF ARTERIOVENOUS  FISTULA Left 09/24/2020   Procedure: LIGATION OF LEFT ARM ARTERIOVENOUS  FISTULA;  Surgeon: Rosetta Posner, MD;  Location: [place];  Service: Vascular;  Laterality: Left;   LUMBAR LAMINECTOMY/DECOMPRESSION MICRODISCECTOMY Left 03/23/2013   Procedure: LUMBAR LAMINECTOMY/DECOMPRESSION MICRODISCECTOMY 1 LEVEL;  Surgeon: Eustace Moore, MD;  Location: [facility];  Service: Neurosurgery;  Laterality: Left;  LUMBAR LAMINECTOMY/DECOMPRESSION MICRODISCECTOMY 1 LEVEL   MASTECTOMY, PARTIAL Left 02/26/2003   ; S/P re-excision of cranial and lateral margins 04/19/2003.    RE-EXCISION OF BREAST CANCER,SUPERIOR MARGINS Right 10/27/2012   Procedure: RE-EXCISION OF BREAST CANCER,SUPERIOR  and inferior MARGINS;  Surgeon: [name], MD;  Location: [place];  Service: General;  Laterality: Right;   RE-EXCISION OF BREAST LUMPECTOMY Left 04/2003   TEE WITHOUT CARDIOVERSION N/A 04/04/2019   Procedure: Transesophageal Echocardiogram (Tee);  Surgeon: Wonda Olds, MD;  Location: [place];  Service: Open Heart Surgery;  Laterality: N/A;   THORACIC AORTIC ANEURYSM REPAIR N/A 04/04/2019   Procedure: THORACIC ASCENDING ANEURYSM REPAIR (AAA)  USING 28 MM X 30 CM HEMASHIELD PLATINUM VASCULAR GRAFT;  Surgeon: Wonda Olds, MD;  Location: [facility];  Service: Open Heart Surgery;  Laterality: N/A;   Social History:  reports that she quit smoking about 2 years ago. Her smoking use included cigarettes. She has a 11.00 pack-year smoking history. She has never used smokeless tobacco. She reports that she does not currently use alcohol after a past usage of about 2.0 standard drinks of alcohol per week. She reports that she does not currently use drugs after having used the following drugs: Cocaine.  Allergies  Allergen Reactions   Shrimp [Shellfish Allergy] Shortness Of Breath   Bactroban [Mupirocin] Other (See Comments)    "Sores in nose"   Vicodin [Hydrocodone-Acetaminophen] Itching and Nausea And Vomiting    This is patient's home medication   Eliquis [Apixaban] Itching    Spoke with patient, no rash.  Has tolerated  on re-challenge.   Lisinopril Cough   Tylenol [Acetaminophen] Itching    Family History  Problem Relation Age of Onset   Colon cancer Mother 2   Hypertension Mother    Diabetes Sister 8   Hypertension Sister    Diabetes Brother    Hypertension Brother    Diabetes Brother    Hypertension Brother    Kidney disease Son        s/p renal transplant   Hypertension Son    Diabetes Son    Multiple sclerosis Son    Bone cancer Sister 36   Breast cancer Neg Hx    Cervical cancer Neg Hx     Prior to Admission medications   Medication Sig Start Date End Date Taking?  Authorizing Provider  albuterol (VENTOLIN HFA) 108 (90 Base) MCG/ACT inhaler Inhale 2 puffs into the lungs every 6 (six) hours as needed for wheezing or shortness of breath. 07/30/20   Maximiano Coss, NP  amLODipine (NORVASC) 10 MG tablet Take 1 tablet (10 mg total) by mouth daily. 02/14/21   Maximiano Coss, NP  apixaban (ELIQUIS) 2.5 MG TABS tablet Take 1 tablet (2.5 mg total) by mouth 2 (two) times daily. 08/24/21   Shary Key, DO  carvedilol (COREG) 6.25 MG tablet Take 1 tablet (6.25 mg total) by mouth 2 (two) times daily with a meal. 12/08/21   Wouk, Ailene Rud, MD  cetirizine (ZYRTEC) 10 MG tablet Take 10 mg by mouth daily as needed for allergies.    [provider]  cloNIDine (CATAPRES - DOSED IN MG/24 HR) 0.1 mg/24hr patch Place 1 patch (0.1 mg total) onto the skin every Saturday. Patient not taking: Reported on 10/29/2021 06/14/21   Caren Griffins, MD  hydrALAZINE (APRESOLINE) 25 MG tablet Take 25 mg by mouth 2 (two) times daily. 11/04/21   [provider]  hydrALAZINE (APRESOLINE) 50 MG tablet Take 1 tablet (50 mg total) by mouth 3 (three) times daily. 06/21/21 10/29/21  Nolberto Hanlon, MD  isosorbide mononitrate (IMDUR) 30 MG 24 hr tablet Take 1 tablet (30 mg total) by mouth daily. 08/17/21   Shary Key, DO  loperamide (IMODIUM) 2 MG capsule Take 2 mg by mouth as needed for diarrhea or loose stools. 07/15/20   [provider]  losartan (COZAAR) 100 MG tablet Take 1 tablet (100 mg total) by mouth daily. 02/14/21   Maximiano Coss, NP  mirtazapine (REMERON) 7.5 MG tablet Take 1 tablet (7.5 mg total) by mouth at bedtime. Patient taking differently: Take 7.5 mg by mouth at bedtime as needed (sleep). 07/15/20   Maximiano Coss, NP  [facility] 4 MG/0.1ML LIQD nasal spray kit Place 1 spray into the nose as needed for opioid reversal. If no response within 3 minutes then repeat once in the other nostril.  Call 911. 05/15/20   [provider]  pregabalin (LYRICA)  50 MG capsule Take 100 mg by mouth at bedtime. 05/07/21   [provider]  rosuvastatin (CRESTOR) 10 MG tablet Take 1 tablet (10 mg total) by mouth daily. Patient not taking: Reported on 10/29/2021 02/14/21 09/15/21  Maximiano Coss, NP  sevelamer carbonate (RENVELA) 2.4 g PACK Take 2.4 g by mouth 3 (three) times daily with meals. Patient not taking: Reported on 10/29/2021 05/15/21   [provider]  sucroferric oxyhydroxide (VELPHORO) 500 MG chewable tablet Chew 1,000 mg by mouth 3 (three) times daily with meals.    [provider]  torsemide (DEMADEX) 20 MG tablet Take 40 mg  by mouth daily.    [provider]  VITAMIN D PO Take 5,000 Units by mouth daily.    [provider]    Physical Exam: Vitals:   01/06/22 1730 01/06/22 1801 01/06/22 1854 01/06/22 1858  BP: (!) 167/129 135/89  (!) 140/93  Pulse: (!) 51 (!) 103  93  Resp: 16 (!) 21  20  Temp:  98.2 F (36.8 C)  98.8 F (37.1 C)  TempSrc:    Oral  SpO2: 97%   94%  Weight:  46.7 kg 45.3 kg   Height:   5' 3"  (1.6 m)    General:  Appears calm and comfortable and is in NAD. Cachetic and tearful at times.  Eyes:  PERRL, EOMI, normal lids, iris ENT:  grossly normal hearing, lips & tongue, mmm; poor dentition. Neck:  no LAD, masses or thyromegaly; no carotid bruits Cardiovascular:  RRR, no m/r/g. No LE edema.  Respiratory:   CTA bilaterally with no wheezes/rales/rhonchi.  Normal respiratory effort. Abdomen:  soft, NT, ND, NABS Back:   normal alignment, no CVAT Skin:  no rash or induration seen on limited exam. Right TDC  Musculoskeletal:  grossly normal tone BUE/BLE, good ROM, no bony abnormality Lower extremity:  No LE edema.  Limited foot exam with no ulcerations.  2+ distal pulses. Psychiatric:  grossly normal mood and affect, speech fluent and appropriate, AOx3 Neurologic:  CN 2-12 grossly intact, moves all extremities in coordinated fashion, sensation intact   Radiological Exams on  Admission: Independently reviewed - see discussion in A/P where applicable  DG Chest Portable 1 View  Result Date: 01/06/2022 CLINICAL DATA:  Missed dialysis, hypertensive emergency, short of breath and chest tightness similar to prior. EXAM: PORTABLE CHEST - 1 VIEW COMPARISON:  12/24/2021, 12/07/2021 FINDINGS: The mediastinal contours are within normal limits. Unchanged cardiomegaly. Atherosclerotic calcification of the aortic arch. Unchanged position of indwelling right internal jugular tunneled hemodialysis catheter with the catheter tip in the right atrium. Mild bibasilar hazy opacities, slightly increased from comparison. No pneumothorax or pleural effusion. Again seen are multiple surgical clips projecting over the right axilla and right breast. No acute osseous abnormality. IMPRESSION: 1. Minimal bibasilar subsegmental atelectasis versus pulmonary edema. 2. Unchanged position of indwelling right IJ tunneled hemodialysis catheter. 3. Unchanged cardiomegaly. Electronically Signed   By: Ruthann Cancer M.D.   On: 01/06/2022 09:45    EKG: Independently reviewed.  NSR with rate 87; nonspecific ST changes with no evidence of acute ischemia. RBBB   Labs on Admission: I have personally reviewed the available labs and imaging studies at the time of the admission.  Pertinent labs:   potassium: 7.2,  BUN: 114,  creatinine: 12.75,  lipase: 79,  troponin 280>pending AG 17    Assessment and Plan: Principal Problem:   Hypertensive urgency Active Problems:   Hyperkalemia   ESRD (end stage renal disease) on dialysis with hyperkalemia and metabolic acidosis with elevated AG    Acute on chronic combined systolic and diastolic CHF (congestive heart failure) (HCC)   AF (paroxysmal atrial fibrillation) (HCC)   Abdominal pain   S/P aortic aneurysm repair   Polysubstance abuse (HCC)   Tobacco use disorder    Assessment and Plan: * Hypertensive urgency 71 year old female with history of ESRD on  dialysis and refractory HTN who presented to ED after missing dialysis on Saturday with chest pain and shortness of breath found to be volume overloaded with HTN urgency/emergency. History of cocaine abuse as well, last + UDS  four months ago.  -observation to progressive -nephrology consulted with plans for HD today  -given IV labetalol and IV hydralazine in ED with some improvement in readings, recommended to start back home meds + dialysis. EDP discussed with nephrology who did not feel like she needed clevaprex and would take to dialysis.  -troponin elevated, but at her baseline. Continue to cycle.  -resume home anti HTN medication including norvasc 27m daily, hydralazine 222mBID, losartan 1005maily (potassium elevated but going to dialysis, will resume after per day team), demadex (unsure if taking) and imdur 11m42m-check UDS, hold coreg until this results  -continue IV hydralazine prn   ESRD (end stage renal disease) on dialysis with hyperkalemia and metabolic acidosis with elevated AG  Has dialysis on TTS, missed Saturday session and is overloaded with worsening uremia and potassium at 7.2 Nephrology consulted and plans for urgent HD today Chemo dialysis in ED, no changes on EKG Plan for HD today, repeat bmp later On progressive for telemetry  Dialysis per nephrology, appreciate help   Acute on chronic combined systolic and diastolic CHF (congestive heart failure) (HCC)[place] with possible pulmonary edema on CXR in setting of missed dialysis session Urgent dialysis today Volume control per dialysis  Strict I/O Resume home medication of demadex, coreg, f/u on potassium and restart losartan tomorrow  Abdominal pain Resting comfortably, lipase mildly elevated at 79, but pain seems to be lower.  Exam benign, soft with no surgical abdomen  Will follow closely after dialysis to see if improves Low threshold to image   AF (paroxysmal atrial fibrillation) (HCC) Rate  controlled, continue eliquis and coreg once uds results   S/P aortic aneurysm repair History of aortic repair with HTN urgency/emergency.  EDP had discussion with patient in regards to repeating CTA. She just had one done on 7/9. Through shared decision making they opted not to repeat imaging at this time.  Chest pain has improved and blood pressure is now <180<888tolic while I'm seeing her in HD Follow closely in progressive, low threshold to image if worsening pressures or pain   Polysubstance abuse (HCC) UDS pending   Tobacco use disorder Nicotine patch     Advance Care Planning:   Code Status: DNR   Consults: nephrology: Dr. ScheJonnie FinnerVT Prophylaxis: eliquis  Family Communication: none   Severity of Illness: The appropriate patient status for this patient is OBSERVATION. Observation status is judged to be reasonable and necessary in order to provide the required intensity of service to ensure the patient's safety. The patient's presenting symptoms, physical exam findings, and initial radiographic and laboratory data in the context of their medical condition is felt to place them at decreased risk for further clinical deterioration. Furthermore, it is anticipated that the patient will be medically stable for discharge from the hospital within 2 midnights of admission.   Author: AlliOrma Flaming 01/06/2022 7:18 PM  For on call review [email or web address]

## 2022-01-06 NOTE — Assessment & Plan Note (Addendum)
Resting comfortably, lipase mildly elevated at 79, but pain seems to be lower.  Exam benign, soft with no surgical abdomen  Will follow closely after dialysis to see if improves Low threshold to image

## 2022-01-06 NOTE — ED Provider Notes (Signed)
[facility], [place] EMERGENCY DEPARTMENT Provider Note   CSN: [id number] Arrival date & time: 01/06/22  3903     History  Chief Complaint  Patient presents with   Monique Henderson    Monique Henderson is a 71 y.o. female.  The history is provided by the patient and medical records. No language interpreter was used.  Monique Henderson Henderson location:  Generalized Henderson quality: aching   Henderson radiates to:  Chest Henderson severity:  Moderate Onset quality:  Gradual Duration:  1 day Timing:  Constant Progression:  Waxing and waning Chronicity:  Recurrent Relieved by:  Nothing Worsened by:  Nothing Ineffective treatments:  None tried Associated symptoms: chest Henderson, chills, cough, fatigue, nausea, shortness of breath and vomiting   Associated symptoms: no constipation, no diarrhea, no dysuria, no fever and no flatus        Home Medications Prior to Admission medications   Medication Sig Start Date End Date Taking? Authorizing Provider  albuterol (VENTOLIN HFA) 108 (90 Base) MCG/ACT inhaler Inhale 2 puffs into the lungs every 6 (six) hours as needed for wheezing or shortness of breath. 07/30/20   Maximiano Coss, NP  amLODipine (NORVASC) 10 MG tablet Take 1 tablet (10 mg total) by mouth daily. 02/14/21   Maximiano Coss, NP  apixaban (ELIQUIS) 2.5 MG TABS tablet Take 1 tablet (2.5 mg total) by mouth 2 (two) times daily. 08/24/21   Shary Key, DO  carvedilol (COREG) 6.25 MG tablet Take 1 tablet (6.25 mg total) by mouth 2 (two) times daily with a meal. 12/08/21   Wouk, Ailene Rud, MD  cetirizine (ZYRTEC) 10 MG tablet Take 10 mg by mouth daily as needed for allergies.    [provider]  cloNIDine (CATAPRES - DOSED IN MG/24 HR) 0.1 mg/24hr patch Place 1 patch (0.1 mg total) onto the skin every Saturday. Patient not taking: Reported on 10/29/2021 06/14/21   Caren Griffins, MD  hydrALAZINE (APRESOLINE) 25 MG tablet Take 25 mg by mouth 2 (two) times daily. 11/04/21    [provider]  hydrALAZINE (APRESOLINE) 50 MG tablet Take 1 tablet (50 mg total) by mouth 3 (three) times daily. 06/21/21 10/29/21  Nolberto Hanlon, MD  isosorbide mononitrate (IMDUR) 30 MG 24 hr tablet Take 1 tablet (30 mg total) by mouth daily. 08/17/21   Shary Key, DO  loperamide (IMODIUM) 2 MG capsule Take 2 mg by mouth as needed for diarrhea or loose stools. 07/15/20   [provider]  losartan (COZAAR) 100 MG tablet Take 1 tablet (100 mg total) by mouth daily. 02/14/21   Maximiano Coss, NP  mirtazapine (REMERON) 7.5 MG tablet Take 1 tablet (7.5 mg total) by mouth at bedtime. Patient taking differently: Take 7.5 mg by mouth at bedtime as needed (sleep). 07/15/20   Maximiano Coss, NP   [facility] 4 MG/0.1ML LIQD nasal spray kit Place 1 spray into the nose as needed for opioid reversal. If no response within 3 minutes then repeat once in the other nostril.  Call 911. 05/15/20   [provider]  pregabalin (LYRICA) 50 MG capsule Take 100 mg by mouth at bedtime. 05/07/21   [provider]  rosuvastatin (CRESTOR) 10 MG tablet Take 1 tablet (10 mg total) by mouth daily. Patient not taking: Reported on 10/29/2021 02/14/21 09/15/21  Maximiano Coss, NP  sevelamer carbonate (RENVELA) 2.4 g PACK Take 2.4 g by mouth 3 (three) times daily with meals. Patient not taking: Reported on 10/29/2021 05/15/21   [provider]  sucroferric oxyhydroxide (VELPHORO) 500 MG chewable tablet Chew 1,000 mg by mouth 3 (three) times daily with meals.    [provider]  torsemide (DEMADEX) 20 MG tablet Take 40 mg by mouth daily.    [provider]  VITAMIN D PO Take 5,000 Units by mouth daily.    [provider]      Allergies    Shrimp [shellfish allergy], Bactroban [mupirocin], Vicodin [hydrocodone-acetaminophen], Eliquis [apixaban], Lisinopril, and Tylenol [acetaminophen]    Review of Systems   Review of Systems  Constitutional:  Positive for chills  and fatigue. Negative for diaphoresis and fever.  HENT:  Negative for congestion.   Respiratory:  Positive for cough, chest tightness and shortness of breath. Negative for wheezing.   Cardiovascular:  Positive for chest Henderson. Negative for palpitations.  Gastrointestinal:  Positive for Monique Henderson, nausea and vomiting. Negative for constipation, diarrhea and flatus.  Genitourinary:  Negative for dysuria, flank Henderson and frequency.  Musculoskeletal:  Negative for back Henderson, neck Henderson and neck stiffness.  Skin:  Negative for wound.  Neurological:  Negative for dizziness, light-headedness and headaches.  Psychiatric/Behavioral:  Negative for agitation.     Physical Exam Updated Vital Signs BP (!) 191/106 (BP Location: Left Arm)   Pulse 75   Temp 98.6 F (37 C) (Oral)   Resp 17   Wt 50.4 kg   SpO2 99%   BMI 23.22 kg/m   Physical Exam Vitals and nursing note reviewed.  Constitutional:      General: She is not in acute distress.    Appearance: She is well-developed. She is not ill-appearing, toxic-appearing or diaphoretic.  HENT:     Head: Normocephalic and atraumatic.     Mouth/Throat:     Mouth: Mucous membranes are moist.  Eyes:     General: No scleral icterus.    Extraocular Movements: Extraocular movements intact.     Conjunctiva/sclera: Conjunctivae normal.     Pupils: Pupils are equal, round, and reactive to light.  Cardiovascular:     Rate and Rhythm: Normal rate and regular rhythm.     Heart sounds: No murmur heard. Pulmonary:     Effort: Pulmonary effort is normal. No respiratory distress.     Breath sounds: Rales present. No wheezing or rhonchi.  Chest:     Chest wall: Tenderness present.  Monique:     General: Abdomen is flat. Bowel sounds are normal. There is no distension.     Palpations: Abdomen is soft.     Tenderness: There is Monique tenderness.  Musculoskeletal:        General: No swelling.     Cervical back: Neck supple.  Skin:    General: Skin  is warm and dry.     Capillary Refill: Capillary refill takes less than 2 seconds.  Neurological:     General: No focal deficit present.     Mental Status: She is alert.  Psychiatric:        Mood and Affect: Mood normal.     ED Results / Procedures / Treatments   Labs (all labs ordered are listed, but only abnormal results are displayed) Labs Reviewed  CBC WITH DIFFERENTIAL/PLATELET - Abnormal; Notable for the following components:      Result Value   RDW 21.0 (*)    Platelets 411 (*)    Lymphs Abs 0.5 (*)    All other components within normal limits  COMPREHENSIVE METABOLIC PANEL - Abnormal; Notable for the following components:   Potassium 7.2 (*)  CO2 21 (*)    Glucose, Bld 106 (*)    BUN 114 (*)    Creatinine, Ser 12.75 (*)    GFR, Estimated 3 (*)    Anion gap 17 (*)    All other components within normal limits  LIPASE, BLOOD - Abnormal; Notable for the following components:   Lipase 79 (*)    All other components within normal limits  TROPONIN I (HIGH SENSITIVITY) - Abnormal; Notable for the following components:   Troponin I (High Sensitivity) 280 (*)    All other components within normal limits  SARS CORONAVIRUS 2 BY RT PCR  RAPID URINE DRUG SCREEN, HOSP PERFORMED  BASIC METABOLIC PANEL  TROPONIN I (HIGH SENSITIVITY)    EKG EKG Interpretation  Date/Time:  Tuesday January 06 2022 09:40:35 EDT Ventricular Rate:  87 PR Interval:  178 QRS Duration: 145 QT Interval:  411 QTC Calculation: 495 R Axis:   41 Text Interpretation: Sinus rhythm Left atrial enlargement Right bundle branch block when compared to prior, similar appearance. No STEMI Confirmed by Antony Blackbird [phone]) on 01/06/2022 9:44:40 AM  Radiology DG Chest Portable 1 View  Result Date: 01/06/2022 CLINICAL DATA:  Missed dialysis, hypertensive emergency, short of breath and chest tightness similar to prior. EXAM: PORTABLE CHEST - 1 VIEW COMPARISON:  12/24/2021, 12/07/2021 FINDINGS: The mediastinal  contours are within normal limits. Unchanged cardiomegaly. Atherosclerotic calcification of the aortic arch. Unchanged position of indwelling right internal jugular tunneled hemodialysis catheter with the catheter tip in the right atrium. Mild bibasilar hazy opacities, slightly increased from comparison. No pneumothorax or pleural effusion. Again seen are multiple surgical clips projecting over the right axilla and right breast. No acute osseous abnormality. IMPRESSION: 1. Minimal bibasilar subsegmental atelectasis versus pulmonary edema. 2. Unchanged position of indwelling right IJ tunneled hemodialysis catheter. 3. Unchanged cardiomegaly. Electronically Signed   By: Ruthann Cancer M.D.   On: 01/06/2022 09:45    Procedures Procedures    CRITICAL CARE Performed by: Gwenyth Allegra Paolo Okane Total critical care time: 45 minutes Critical care time was exclusive of separately billable procedures and treating other patients. Critical care was necessary to treat or prevent imminent or life-threatening deterioration. Critical care was time spent personally by me on the following activities: development of treatment plan with patient and/or surrogate as well as nursing, discussions with consultants, evaluation of patient's response to treatment, examination of patient, obtaining history from patient or surrogate, ordering and performing treatments and interventions, ordering and review of laboratory studies, ordering and review of radiographic studies, pulse oximetry and re-evaluation of patient's condition.   Medications Ordered in ED Medications  Chlorhexidine Gluconate Cloth 2 % PADS 6 each (has no administration in time range)  diphenhydrAMINE (BENADRYL) capsule 25 mg (has no administration in time range)  amLODipine (NORVASC) tablet 10 mg (has no administration in time range)  hydrALAZINE (APRESOLINE) tablet 25 mg (has no administration in time range)  isosorbide mononitrate (IMDUR) 24 hr tablet 30 mg  (has no administration in time range)  sodium chloride flush (NS) 0.9 % injection 3 mL (has no administration in time range)  sodium chloride flush (NS) 0.9 % injection 3 mL (has no administration in time range)  0.9 %  sodium chloride infusion (has no administration in time range)  acetaminophen (TYLENOL) tablet 650 mg (has no administration in time range)    Or  acetaminophen (TYLENOL) suppository 650 mg (has no administration in time range)  nicotine (NICODERM CQ - dosed in mg/24 hours) patch 14 mg (has  no administration in time range)  hydrALAZINE (APRESOLINE) injection 10 mg (has no administration in time range)  labetalol (NORMODYNE) injection 10 mg (10 mg Intravenous Given 01/06/22 0946)  labetalol (NORMODYNE) injection 10 mg (10 mg Intravenous Given 01/06/22 1055)  hydrALAZINE (APRESOLINE) injection 10 mg (10 mg Intravenous Given 01/06/22 1157)  insulin aspart (novoLOG) injection 5 Units (5 Units Intravenous Given 01/06/22 1316)    And  dextrose 50 % solution 50 mL (50 mLs Intravenous Given 01/06/22 1317)  albuterol (PROVENTIL) (2.5 MG/3ML) 0.083% nebulizer solution 10 mg (10 mg Nebulization Given 01/06/22 1322)  labetalol (NORMODYNE) injection 10 mg (10 mg Intravenous Given 01/06/22 1309)  calcium gluconate 1 g/ 50 mL sodium chloride IVPB (1,000 mg Intravenous New Bag/Given 01/06/22 1316)    ED Course/ Medical Decision Making/ A&P                           Medical Decision Making Amount and/or Complexity of Data Reviewed Labs: ordered. Radiology: ordered.  Risk OTC drugs. Prescription drug management. Decision regarding hospitalization.    VONDRA ALDREDGE is a 71 y.o. female with a past medical history significant for ESRD on dialysis TTS, hypertension, GERD, previous aortic dissection status postrepair, atrial fibrillation on Eliquis therapy, recent admission for hypertensive emergency causing chest Henderson, shortness of breath, Monique Henderson discharged 1 week ago who presents with  similar symptoms of chest tightness, short of breath, Monique discomfort, nausea, vomiting, and missing dialysis.  According to patient, she felt bad on Saturday and was unable to go to dialysis and then today was having worsened symptoms overnight.  She reports it feels the same as she did last time.  She reports she does not have any stabbing sharp Henderson in her abdomen that is more generalized aching and tightness in her chest and abdomen.  Her recent visit had a full dissection study that did not show acute dissection.  She was initially started on Cleviprex but then was de-escalated to IV blood pressure medicine and was admitted to a medicine floor.  Patient says that she has not had any headache, neck Henderson, or confusion but is feeling like her blood pressure is very high and this causing her shortness of breath and symptoms.  She reports some mild nausea and vomiting overnight but denies any bowel changes.  She reports her urine that she still makes some is normal for her.  Denies any trauma.  Reports her discomfort is mild to moderate but blood pressure is over 200 on my initial evaluation.  On exam, lungs had rales and she does have a murmur.  Abdomen was mildly tender.  Chest also slightly tender.  She has intact sensation, strength, and pulses in extremities.  Legs have mild edema.  Blood pressure is over 200 initially.  Had a shared decision-making conversation with patient.  I suspect that given her sensitivity to fluid shifts, her missing dialysis on Saturday likely led to recurrent episode that is similar to when she had to be admitted 2 weeks ago.  We will reach out to nephrology early and give her some IV labetalol initially.  It appears the end of using hydralazine as well so we will use that if the labetalol does not help.  We will get some screening labs and chest x-ray given her report of some cough and the shortness of breath however I suspect this is more fluid overload causing her  symptoms.  As patient is not describing any  difference in the chest and Monique discomfort nor is it sharp and stabbing going to her back, I have less suspicion that she is having acute aortic pathology at this time.  Patient agrees to hold on CT imaging initially.  Anticipate admission after workup is completed     12:51 PM Labs have continued to return and potassium was found to be 7.2 with no hemolysis.  Creatinine is also worse than prior.  I quickly spoke to nephrology who will see and take her to dialysis.  They requested we temporize with IV medications with the calcium, insulin/glucose, give her albuterol.  They agree with giving her IV blood pressure medication to keep her under 180 so we will give her some more labetalol and they agree with admission to medicine for further management.  They agree with holding on CT imaging of her chest at this time given the similarity to prior.  Will call for admission.         Final Clinical Impression(s) / ED Diagnoses Final diagnoses:  Hypertensive emergency  Hypervolemia, unspecified hypervolemia type  Hyperkalemia    /Clinical Impression: 1. Hypertensive emergency   2. Hypervolemia, unspecified hypervolemia type   3. Hyperkalemia     Disposition: Admit  This note was prepared with assistance of Dragon voice recognition software. Occasional wrong-word or sound-a-like substitutions may have occurred due to the inherent limitations of voice recognition software.      Sarim Rothman, Gwenyth Allegra, MD 01/06/22 [phone]

## 2022-01-06 NOTE — Assessment & Plan Note (Signed)
UDS pending

## 2022-01-06 NOTE — Progress Notes (Signed)
  [facility] Progress Note   Interim History: Discharged from [facility] 8/1 following admission with HTN emergency/AMS in the setting of substance use. Code status changed to DNR. Now admitted under observation status with similar presentation. Presented to the ED with chest pain/dyspnea. Hypertension with severe hyperkalemia. She missed dialysis Saturday. Says she just didn't feel like going. Received shifting medications and IV antihypertensives in the ED and now in HD unit for urgent dialysis.    Objective Vitals:   01/06/22 1430 01/06/22 1500 01/06/22 1530 01/06/22 1600  BP: (!) 184/108 (!) 198/114 (!) 190/118 (!) 191/106  Pulse: 79 80 84 75  Resp: 20 (!) 24 (!) 23 17  Temp:      TempSrc:      SpO2: 98% 96% 98% 99%  Weight:         Additional Objective Labs: Basic Metabolic Panel: Recent Labs  Lab 01/06/22 1046  NA 140  K 7.2*  CL 102  CO2 21*  GLUCOSE 106*  BUN 114*  CREATININE 12.75*  CALCIUM 9.7   CBC: Recent Labs  Lab 01/06/22 0920  WBC 4.9  NEUTROABS 3.8  HGB 12.3  HCT 37.4  MCV 92.6  PLT 411*   Blood Culture    Component Value Date/Time   SDES URINE, CLEAN CATCH 08/12/2021 1250   SPECREQUEST  08/12/2021 1250    NONE Performed at [facility], [place] [address], [place], [place] [address]    CULT MULTIPLE SPECIES PRESENT, SUGGEST RECOLLECTION (A) 08/12/2021 1250   REPTSTATUS 08/15/2021 FINAL 08/12/2021 1250     Physical Exam General: Chronically ill appearing, nad Heart: RRR Lungs: Clear bilaterally  Abdomen: soft non-tender  Extremities: No sig LE edema Dialysis Access: R IJ TDC   Medications:  sodium chloride      amLODipine  10 mg Oral Daily   [START ON 01/07/2022] Chlorhexidine Gluconate Cloth  6 each Topical Q0600   diphenhydrAMINE  25 mg Oral Once   hydrALAZINE  25 mg Oral BID   isosorbide mononitrate  30 mg Oral Daily   nicotine  14 mg Transdermal Daily   sodium chloride flush  3 mL Intravenous Q12H   Home meds  include - amlodipine 10, apixaban, carvedilol 6.25 bid, clonidine patch 0.1 weekly, hydralazine 25- '50mg'$  2-3x per day, isosorbide mononitrate 30, losartan 100 qd, mirtazapine, pregabalin 100 hs, rosuvastatin, sevelamer carbonate 3 ac tid, sucroferric oxyhydroxide 1 gm ac tid, vits/ supps/ prns  Dialysis Orders:  AF TTS  3h 56mn   400/1.5  43.5kg  2/2 bath P2  Hep none  RIJ TDC - last HD 8/3 post wt 43.5kg  - mircera 50 mcg q2, last 7/18, due early Aug - iron sucrose 100 iv q hd thru 8/17 - doxercalciferol 2 ug iv tiw  Assessment/Plan: 1. HTN urgency/chest pain/dyspnea.Volume overload also contributing. Pulm edema CXR. Urgent dialysis today 3L UF goal. Continue home BP meds.  2. Hyperkalemia K 7.2 on arrival. Correct with HD today  3. ESRD HD TTS. HD today. Not compliant with dialysis Rx. Frequent admits d/t missing dialysis.  4. Anemia- Hgb >12. No ESA needs  5. MBD - Continue binders. Hold VDRA while here.  6. DNR status.   OLynnda ChildPA-C [facility] 01/06/2022,4:02 PM

## 2022-01-06 NOTE — Assessment & Plan Note (Addendum)
71 year old female with history of ESRD on dialysis and refractory HTN who presented to ED after missing dialysis on Saturday with chest pain and shortness of breath found to be volume overloaded with HTN urgency/emergency. History of cocaine abuse as well, last + UDS four months ago.  -observation to progressive -nephrology consulted with plans for HD today  -given IV labetalol and IV hydralazine in ED with some improvement in readings, recommended to start back home meds + dialysis. EDP discussed with nephrology who did not feel like she needed clevaprex and would take to dialysis.  -troponin elevated, but at her baseline. Continue to cycle.  -resume home anti HTN medication including norvasc '10mg'$  daily, hydralazine '25mg'$  BID, losartan '100mg'$  daily (potassium elevated but going to dialysis, will resume after per day team), demadex (unsure if taking) and imdur '30mg'$ .  -check UDS, hold coreg until this results  -continue IV hydralazine prn

## 2022-01-06 NOTE — Assessment & Plan Note (Addendum)
Shortness of breath with possible pulmonary edema on CXR in setting of missed dialysis session Urgent dialysis today Volume control per dialysis  Strict I/O Resume home medication of demadex, coreg, f/u on potassium and restart losartan tomorrow

## 2022-01-06 NOTE — Progress Notes (Addendum)
Received patient in bed to unit.  Alert and oriented, anxious, dyspnea at rest, chest and abdominal discomfort pt endorses as chronic. Dr Rogers Blocker at bedside and aware of pt chest and abdominal discomfort. Informed consent signed and in  chart.   Treatment initiated:1415 Treatment completed:1800  Patient tolerated well.  Transported to 3E11 alert, without acute distress.  Hand-off given to patient's nurse.   Access used: HD catheter right chest Access issues: none  Total UF removed: 3400 Medication(s) given: tylenol, hydralazine Post HD VS: BP 135/89 MAP 103 HR 126 Sat 96 on room air Temp oral 98.2 Post HD weight: 46.7 kg   [facility]

## 2022-01-06 NOTE — Assessment & Plan Note (Signed)
-  Nicotine patch 

## 2022-01-06 NOTE — Assessment & Plan Note (Addendum)
Has dialysis on TTS, missed Saturday session and is overloaded with worsening uremia and potassium at 7.2 Nephrology consulted and plans for urgent HD today Chemo dialysis in ED, no changes on EKG Plan for HD today, repeat bmp later On progressive for telemetry  Dialysis per nephrology, appreciate help

## 2022-01-06 NOTE — Progress Notes (Addendum)
Admitting md at bedside, pt endorses chest and abdominal discomfort to md. Md states ekg done in ED. Pt currently in NSR  hr 80s

## 2022-01-07 DIAGNOSIS — N186 End stage renal disease: Secondary | ICD-10-CM | POA: Diagnosis present

## 2022-01-07 DIAGNOSIS — E872 Acidosis, unspecified: Secondary | ICD-10-CM | POA: Diagnosis present

## 2022-01-07 DIAGNOSIS — E875 Hyperkalemia: Secondary | ICD-10-CM | POA: Diagnosis present

## 2022-01-07 DIAGNOSIS — Z20822 Contact with and (suspected) exposure to covid-19: Secondary | ICD-10-CM | POA: Diagnosis present

## 2022-01-07 DIAGNOSIS — I132 Hypertensive heart and chronic kidney disease with heart failure and with stage 5 chronic kidney disease, or end stage renal disease: Secondary | ICD-10-CM | POA: Diagnosis present

## 2022-01-07 DIAGNOSIS — I161 Hypertensive emergency: Secondary | ICD-10-CM | POA: Diagnosis present

## 2022-01-07 DIAGNOSIS — J449 Chronic obstructive pulmonary disease, unspecified: Secondary | ICD-10-CM | POA: Diagnosis present

## 2022-01-07 DIAGNOSIS — Z515 Encounter for palliative care: Secondary | ICD-10-CM | POA: Diagnosis not present

## 2022-01-07 DIAGNOSIS — Z87442 Personal history of urinary calculi: Secondary | ICD-10-CM | POA: Diagnosis not present

## 2022-01-07 DIAGNOSIS — I10 Essential (primary) hypertension: Secondary | ICD-10-CM

## 2022-01-07 DIAGNOSIS — Z800 Family history of malignant neoplasm of digestive organs: Secondary | ICD-10-CM | POA: Diagnosis not present

## 2022-01-07 DIAGNOSIS — F17200 Nicotine dependence, unspecified, uncomplicated: Secondary | ICD-10-CM | POA: Diagnosis not present

## 2022-01-07 DIAGNOSIS — I5042 Chronic combined systolic (congestive) and diastolic (congestive) heart failure: Secondary | ICD-10-CM | POA: Diagnosis present

## 2022-01-07 DIAGNOSIS — Z992 Dependence on renal dialysis: Secondary | ICD-10-CM | POA: Diagnosis not present

## 2022-01-07 DIAGNOSIS — Z86000 Personal history of in-situ neoplasm of breast: Secondary | ICD-10-CM | POA: Diagnosis not present

## 2022-01-07 DIAGNOSIS — Z820 Family history of epilepsy and other diseases of the nervous system: Secondary | ICD-10-CM | POA: Diagnosis not present

## 2022-01-07 DIAGNOSIS — I480 Paroxysmal atrial fibrillation: Secondary | ICD-10-CM

## 2022-01-07 DIAGNOSIS — G9341 Metabolic encephalopathy: Secondary | ICD-10-CM | POA: Diagnosis not present

## 2022-01-07 DIAGNOSIS — Z833 Family history of diabetes mellitus: Secondary | ICD-10-CM | POA: Diagnosis not present

## 2022-01-07 DIAGNOSIS — E8770 Fluid overload, unspecified: Secondary | ICD-10-CM

## 2022-01-07 DIAGNOSIS — Z8249 Family history of ischemic heart disease and other diseases of the circulatory system: Secondary | ICD-10-CM | POA: Diagnosis not present

## 2022-01-07 DIAGNOSIS — Z79899 Other long term (current) drug therapy: Secondary | ICD-10-CM | POA: Diagnosis not present

## 2022-01-07 DIAGNOSIS — I5043 Acute on chronic combined systolic (congestive) and diastolic (congestive) heart failure: Secondary | ICD-10-CM | POA: Diagnosis not present

## 2022-01-07 DIAGNOSIS — Z8673 Personal history of transient ischemic attack (TIA), and cerebral infarction without residual deficits: Secondary | ICD-10-CM | POA: Diagnosis not present

## 2022-01-07 DIAGNOSIS — Z66 Do not resuscitate: Secondary | ICD-10-CM | POA: Diagnosis present

## 2022-01-07 DIAGNOSIS — D631 Anemia in chronic kidney disease: Secondary | ICD-10-CM | POA: Diagnosis present

## 2022-01-07 DIAGNOSIS — I160 Hypertensive urgency: Secondary | ICD-10-CM | POA: Diagnosis not present

## 2022-01-07 DIAGNOSIS — Z7901 Long term (current) use of anticoagulants: Secondary | ICD-10-CM | POA: Diagnosis not present

## 2022-01-07 LAB — CBC
HCT: 30.1 % — ABNORMAL LOW (ref 36.0–46.0)
Hemoglobin: 9.8 g/dL — ABNORMAL LOW (ref 12.0–15.0)
MCH: 30.1 pg (ref 26.0–34.0)
MCHC: 32.6 g/dL (ref 30.0–36.0)
MCV: 92.3 fL (ref 80.0–100.0)
Platelets: 281 10*3/uL (ref 150–400)
RBC: 3.26 MIL/uL — ABNORMAL LOW (ref 3.87–5.11)
RDW: 19.8 % — ABNORMAL HIGH (ref 11.5–15.5)
WBC: 4 10*3/uL (ref 4.0–10.5)
nRBC: 0 % (ref 0.0–0.2)

## 2022-01-07 LAB — BASIC METABOLIC PANEL
Anion gap: 15 (ref 5–15)
BUN: 40 mg/dL — ABNORMAL HIGH (ref 8–23)
CO2: 27 mmol/L (ref 22–32)
Calcium: 9.1 mg/dL (ref 8.9–10.3)
Chloride: 94 mmol/L — ABNORMAL LOW (ref 98–111)
Creatinine, Ser: 6.44 mg/dL — ABNORMAL HIGH (ref 0.44–1.00)
GFR, Estimated: 7 mL/min — ABNORMAL LOW (ref >60)
Glucose, Bld: 100 mg/dL — ABNORMAL HIGH (ref 70–99)
Potassium: 3.9 mmol/L (ref 3.5–5.1)
Sodium: 136 mmol/L (ref 135–145)

## 2022-01-07 LAB — LIPASE, BLOOD: Lipase: 169 U/L — ABNORMAL HIGH (ref 11–51)

## 2022-01-07 MED ORDER — PANTOPRAZOLE SODIUM 40 MG IV SOLR
40.0000 mg | Freq: Once | INTRAVENOUS | Status: AC
  Administered 2022-01-07: 40 mg via INTRAVENOUS
  Filled 2022-01-07: qty 10

## 2022-01-07 MED ORDER — LOSARTAN POTASSIUM 50 MG PO TABS
100.0000 mg | ORAL_TABLET | Freq: Every day | ORAL | Status: DC
  Administered 2022-01-07 – 2022-01-10 (×3): 100 mg via ORAL
  Filled 2022-01-07 (×3): qty 2

## 2022-01-07 MED ORDER — MELATONIN 3 MG PO TABS
3.0000 mg | ORAL_TABLET | Freq: Every evening | ORAL | Status: DC | PRN
  Administered 2022-01-07 – 2022-01-08 (×2): 3 mg via ORAL
  Filled 2022-01-07 (×2): qty 1

## 2022-01-07 MED ORDER — CLONIDINE HCL 0.1 MG/24HR TD PTWK
0.1000 mg | MEDICATED_PATCH | TRANSDERMAL | Status: DC
  Administered 2022-01-07: 0.1 mg via TRANSDERMAL
  Filled 2022-01-07: qty 1

## 2022-01-07 MED ORDER — TORSEMIDE 20 MG PO TABS
20.0000 mg | ORAL_TABLET | Freq: Two times a day (BID) | ORAL | Status: DC
  Administered 2022-01-07 – 2022-01-09 (×4): 20 mg via ORAL
  Filled 2022-01-07 (×4): qty 1

## 2022-01-07 NOTE — Telephone Encounter (Signed)
Patient is currently Admitted.Monique KitchenMarland Henderson

## 2022-01-07 NOTE — Progress Notes (Signed)
[facility] PROGRESS NOTE    Progress Note  Monique Henderson  XHB:[id number] DOB: 12/04/1950 DOA: 01/06/2022 PCP: Merryl Hacker, No     Brief Narrative:   Monique Henderson is an 71 y.o. female past medical history of end-stage renal disease on hemodialysis Tuesday Thursdays and Saturdays, paroxysmal atrial fibrillation on Eliquis, chronic diastolic heart failure and noncompliance with her medications multiple admissions for hypertensive urgency polysubstance abuse comes into the ED with abdominal pain chest pain cough and nausea.  She missed her dialysis over the weekend.  In the ED she was found to have a blood pressure of 200/134 with a heart rate of 79 satting 94% on room air.  Assessment/Plan:   Hypertensive urgency: Likely due to noncompliance, will labetalol and hydralazine IV with significant improvement in her blood pressure.  Her neck veins are still elevated on physical exam. She was placed back on her home medication, nephrology was consulted and she was dialyzed overnight. Her blood pressure is morning is improved 180/102.  Continues to be elevated. Will go ahead and restart her on her home regiment.  End-stage renal disease on hemodialysis/hyperkalemia/metabolic acidosis: Nephrology was consulted she was dialyzed overnight. Per nephrology.  Chronic combined systolic and diastolic heart failure: She is not in acute heart failure is likely due to hemodialysis deficiency. In case if noncompliance with her dialysis treatment. Continue strict I's and O's and daily weights. Resume losartan, Demadex and Coreg.  Abdominal pain: Lipase mildly elevated 79 She relates her abdominal pain is resolved.  Status post aortic aneurysm repair: The ED physician discussed with the patient anesthesia she just had 1 on 12/07/2021 they both shared the decision and opted not to repeat imaging. Since her chest pain has improved since her blood pressure has been less than 810  systolic.  Polysubstance abuse: UDS is pending.  Tobacco use: Noted.   DVT prophylaxis: eliquis Family Communication:none Status is: Observation The patient will require care spanning > 2 midnights and should be moved to inpatient because: Hypertensive urgency    Code Status:     Code Status Orders  (From admission, onward)           Start     Ordered   01/06/22 1514  Do not attempt resuscitation (DNR)  Continuous       Question Answer Comment  In the event of cardiac or respiratory ARREST Do not call a "code blue"   In the event of cardiac or respiratory ARREST Do not perform Intubation, CPR, defibrillation or ACLS   In the event of cardiac or respiratory ARREST Use medication by any route, position, wound care, and other measures to relive pain and suffering. May use oxygen, suction and manual treatment of airway obstruction as needed for comfort.      01/06/22 1515           Code Status History     Date Active Date Inactive Code Status Order ID Comments User Context   12/29/2021 1109 12/30/2021 1600 DNR [id number]  Pershing Proud, NP Inpatient   12/24/2021 1616 12/29/2021 1109 Full Code [id number]  Lequita Halt, MD ED   12/24/2021 1523 12/24/2021 1616 Full Code [id number]  Candee Furbish, MD ED   12/07/2021 2310 12/08/2021 2136 Full Code [id number]  Etta Quill, DO ED   10/29/2021 1126 10/31/2021 0030 Full Code [id number]  Norval Morton, MD ED   09/15/2021 0156 09/16/2021 0001 Full Code [id number]  Erskine Emery, MD ED   08/23/2021 [phone] 08/24/2021  2021 Full Code [id number]  Wells Guiles, DO ED   08/12/2021 1702 08/17/2021 0101 Full Code [id number]  France Ravens, MD ED   06/19/2021 0725 06/22/2021 0049 Full Code [id number]  Phillips Grout, MD ED   06/05/2021 0144 06/09/2021 2022 Full Code [id number]  Rise Patience, MD ED   12/23/2020 1429 12/24/2020 2027 Full Code [id number]  Karmen Bongo, MD ED   09/21/2020 2313 09/27/2020 1945 Full Code [id number]  Vianne Bulls, MD ED    08/28/2019 0317 08/30/2019 2326 Full Code [id number]  Vianne Bulls, MD ED   05/18/2019 2053 05/24/2019 1953 Full Code [id number]  Jennye Boroughs, MD Inpatient   04/07/2019 1721 04/23/2019 1432 Full Code [id number]  Wonda Olds, MD Inpatient   04/04/2019 1402 04/07/2019 1721 Full Code [id number]  Wonda Olds, MD Inpatient   01/18/2014 1123 01/19/2014 1427 Full Code [id number]  Eustace Moore, MD Inpatient   03/23/2013 1906 03/24/2013 1643 Full Code [id number]  Eustace Moore, MD Inpatient   02/27/2013 1736 03/01/2013 1937 Full Code [id number]  Otho Bellows, MD Inpatient         IV Access:   Peripheral IV   Procedures and diagnostic studies:   DG Chest Portable 1 View  Result Date: 01/06/2022 CLINICAL DATA:  Missed dialysis, hypertensive emergency, short of breath and chest tightness similar to prior. EXAM: PORTABLE CHEST - 1 VIEW COMPARISON:  12/24/2021, 12/07/2021 FINDINGS: The mediastinal contours are within normal limits. Unchanged cardiomegaly. Atherosclerotic calcification of the aortic arch. Unchanged position of indwelling right internal jugular tunneled hemodialysis catheter with the catheter tip in the right atrium. Mild bibasilar hazy opacities, slightly increased from comparison. No pneumothorax or pleural effusion. Again seen are multiple surgical clips projecting over the right axilla and right breast. No acute osseous abnormality. IMPRESSION: 1. Minimal bibasilar subsegmental atelectasis versus pulmonary edema. 2. Unchanged position of indwelling right IJ tunneled hemodialysis catheter. 3. Unchanged cardiomegaly. Electronically Signed   By: Ruthann Cancer M.D.   On: 01/06/2022 09:45     Medical Consultants:   None.   Subjective:    Monique Henderson she relates her chest pain and abdominal pain are improved would like to have a diet.  Objective:    Vitals:   01/07/22 0003 01/07/22 0031 01/07/22 0056 01/07/22 0522  BP: (!) 155/101   (!) 180/102  Pulse: 90   90   Resp: 18 20 (!) 24   Temp: 98.8 F (37.1 C)   98.6 F (37 C)  TempSrc: Axillary   Oral  SpO2: 94%     Weight:    45.3 kg  Height:       SpO2: 94 % O2 Flow Rate (L/min): 2 L/min   Intake/Output Summary (Last 24 hours) at 01/07/2022 0709 Last data filed at 01/06/2022 1801 Gross per 24 hour  Intake --  Output 3400 ml  Net -3400 ml   Filed Weights   01/06/22 1801 01/06/22 1854 01/07/22 0522  Weight: 46.7 kg 45.3 kg 45.3 kg    Exam: General exam: In no acute distress. Respiratory system: Good air movement and clear to auscultation. Cardiovascular system: S1 & S2 heard, RRR.  Positive JVD Gastrointestinal system: Abdomen is nondistended, soft and nontender.  Extremities: No pedal edema. Skin: No rashes, lesions or ulcers Psychiatry: Judgement and insight appear normal. Mood & affect appropriate.    Data Reviewed:    Labs: Basic Metabolic Panel: Recent Labs  Lab 01/06/22 1046 01/06/22 1909 01/06/22 2203 01/07/22 0349  NA  140 137  --  136  K 7.2* 2.9* 3.5 3.9  CL 102 95*  --  94*  CO2 21* 26  --  27  GLUCOSE 106* 77  --  100*  BUN 114* 33*  --  40*  CREATININE 12.75* 5.13*  --  6.44*  CALCIUM 9.7 9.3  --  9.1   GFR Estimated Creatinine Clearance: 5.9 mL/min (A) (by C-G formula based on SCr of 6.44 mg/dL (H)). Liver Function Tests: Recent Labs  Lab 01/06/22 1046  AST 21  ALT 22  ALKPHOS 102  BILITOT 0.8  PROT 6.9  ALBUMIN 3.7   Recent Labs  Lab 01/06/22 1046 01/07/22 0349  LIPASE 79* 169*   No results for input(s): "AMMONIA" in the last 168 hours. Coagulation profile No results for input(s): "INR", "PROTIME" in the last 168 hours. COVID-19 Labs  No results for input(s): "DDIMER", "FERRITIN", "LDH", "CRP" in the last 72 hours.  Lab Results  Component Value Date   SARSCOV2NAA NEGATIVE 01/06/2022   SARSCOV2NAA NEGATIVE 12/24/2021   [place] NEGATIVE 10/29/2021   [place] NEGATIVE 09/15/2021    CBC: Recent Labs  Lab 01/06/22 0920  01/07/22 0349  WBC 4.9 4.0  NEUTROABS 3.8  --   HGB 12.3 9.8*  HCT 37.4 30.1*  MCV 92.6 92.3  PLT 411* 281   Cardiac Enzymes: No results for input(s): "CKTOTAL", "CKMB", "CKMBINDEX", "TROPONINI" in the last 168 hours. BNP (last 3 results) No results for input(s): "PROBNP" in the last 8760 hours. CBG: No results for input(s): "GLUCAP" in the last 168 hours. D-Dimer: No results for input(s): "DDIMER" in the last 72 hours. Hgb A1c: No results for input(s): "HGBA1C" in the last 72 hours. Lipid Profile: No results for input(s): "CHOL", "HDL", "LDLCALC", "TRIG", "CHOLHDL", "LDLDIRECT" in the last 72 hours. Thyroid function studies: No results for input(s): "TSH", "T4TOTAL", "T3FREE", "THYROIDAB" in the last 72 hours.  Invalid input(s): "FREET3" Anemia work up: No results for input(s): "VITAMINB12", "FOLATE", "FERRITIN", "TIBC", "IRON", "RETICCTPCT" in the last 72 hours. Sepsis Labs: Recent Labs  Lab 01/06/22 0920 01/07/22 0349  WBC 4.9 4.0   Microbiology Recent Results (from the past 240 hour(s))  SARS Coronavirus 2 by RT PCR (hospital order, performed in [facility] hospital lab) *cepheid single result test* Anterior Nasal Swab     Status: None   Collection Time: 01/06/22  9:32 AM   Specimen: Anterior Nasal Swab  Result Value Ref Range Status   SARS Coronavirus 2 by RT PCR NEGATIVE NEGATIVE Final    Comment: (NOTE) SARS-CoV-2 target nucleic acids are NOT DETECTED.  The SARS-CoV-2 RNA is generally detectable in upper and lower respiratory specimens during the acute phase of infection. The lowest concentration of SARS-CoV-2 viral copies this assay can detect is 250 copies / mL. A negative result does not preclude SARS-CoV-2 infection and should not be used as the sole basis for treatment or other patient management decisions.  A negative result may occur with improper specimen collection / handling, submission of specimen other than nasopharyngeal swab, presence of viral  mutation(s) within the areas targeted by this assay, and inadequate number of viral copies (<250 copies / mL). A negative result must be combined with clinical observations, patient history, and epidemiological information.  Fact Sheet for Patients:   https://www.patel.info/  Fact Sheet for Healthcare Providers: https://hall.com/  This test is not yet approved or  cleared by the [place] FDA and has been authorized for detection and/or diagnosis of SARS-CoV-2 by FDA under an  Emergency Use Authorization (EUA).  This EUA will remain in effect (meaning this test can be used) for the duration of the COVID-19 declaration under Section 564(b)(1) of the Act, 21 U.S.C. section 360bbb-3(b)(1), unless the authorization is terminated or revoked sooner.  Performed at [facility], [place] [address], [place], [place] [address]      Medications:    amLODipine  10 mg Oral Daily   apixaban  2.5 mg Oral BID   carvedilol  6.25 mg Oral BID WC   Chlorhexidine Gluconate Cloth  6 each Topical Q0600   diphenhydrAMINE  25 mg Oral Once   hydrALAZINE  25 mg Oral BID   isosorbide mononitrate  30 mg Oral Daily   nicotine  14 mg Transdermal Daily   rosuvastatin  10 mg Oral Daily   sodium chloride flush  3 mL Intravenous Q12H   sucroferric oxyhydroxide  1,000 mg Oral BID WC   torsemide  20 mg Oral BID   Continuous Infusions:  sodium chloride        LOS: 0 days   Charlynne Cousins  [facility]  01/07/2022, 7:09 AM

## 2022-01-07 NOTE — Consult Note (Signed)
Consultation Note Date: 01/07/2022   Patient Name: Monique Henderson  DOB: 03-20-1951  MRN: [id number]  Age / Sex: 71 y.o., female  PCP: Pcp, No Referring Physician: Aileen Fass, Tammi Klippel, MD  Reason for Consultation: Establishing goals of care  HPI/Patient Profile: 71 y.o. female  with past medical history of ESRD on HD TTS, refractory HTN, PAF on Eliquis, chronic HFrEF, noncompliance, history of polysubstance abuse, aortic aneurysm and dissection admitted on 01/06/2022 with complaints of generalized abdominal pain, chest pain with coughing and nausea.   Patient has had 6 admissions and 3 ED visits in the past 6 months, most recently within the last 7 days.  She is well-known to PMT. PMT has been consulted to assist with goals of care conversation.  Clinical Assessment and Goals of Care:  I have reviewed medical records including EPIC notes, labs and imaging, received report from RN, assessed the patient and then met at the bedside to discuss diagnosis prognosis, GOC, EOL wishes, disposition and options.  I introduced Palliative Medicine as specialized medical care for people living with serious illness. It focuses on providing relief from the symptoms and stress of a serious illness. The goal is to improve quality of life for both the patient and the family.  We discussed a brief life review of the patient and then focused on their current illness.   I attempted to elicit values and goals of care important to the patient.    Medical History Review and Understanding:  She understands she is here for her high blood pressure.  Patient told me she did not have a good understanding of her chronic comorbidities and how her kidney disease and heart disease impact each other.  Discussed in detail, providing education on the high risk for decompensation and even death with medication noncompliance and missing  dialysis.  Social History: Patient lives with her boyfriend of 10 years.  She enjoys gardening and spending time with her sister.   Advance Directives: A detailed discussion regarding advanced directives was had.  She will continue reviewing and let me know if she wishes to complete during this hospitalization.   Code Status: Concepts specific to code status, artifical feeding and hydration, and rehospitalization were considered and discussed. DNR confirmed.   Discussion: Patient is a poor historian.  She tells me she does not have a blood pressure cuff, but then tells me she checks her blood pressure on her own. She wonders if she should stop eating so much of the ice from her freezer and we discussed the importance of careful fluid intake in the setting of her cardiac and renal disease. I counseled patient on the importance of medication compliance and going to HD sessions if her desire is to continue pursuing full scope treatment to prolong her life. Explored her thoughts and feelings on hospice and readiness for end of life, she is clear that she is not at that point yet. She is agreeable to continuing the conversation about her care preferences and will let me know if she would like to complete advance care planning documentation during this admission. Her goal is to continue spending time with her sister and enjoying herself for as long as she can.    The difference between aggressive medical intervention and comfort care was considered in light of the patient's goals of care. Hospice and Palliative Care services outpatient were explained and offered.   Discussed the importance of continued conversation with family and the medical providers regarding overall plan of  care and treatment options, ensuring decisions are within the context of the patient's values and GOCs.   Questions and concerns were addressed.  Hard Choices booklet left for review. The family was encouraged to call with  questions or concerns.  PMT will continue to support holistically.   SUMMARY OF RECOMMENDATIONS   -DNR confirmed -Continue full scope treatment otherwise, patient is not ready for hospice or end-of-life -MOST form and AD paperwork introduced today, will follow-up for completion as patient is able and willing -Psychosocial emotional support provided -PMT will continue to follow and support  Prognosis:  Unable to determine  Discharge Planning: To Be Determined      Primary Diagnoses: Present on Admission:  Hypertensive urgency  (Resolved) Essential hypertension  Tobacco use disorder  AF (paroxysmal atrial fibrillation) (HCC)  Hyperkalemia  Hypertensive crisis    Physical Exam Vitals and nursing note reviewed.  Constitutional:      General: She is not in acute distress. Cardiovascular:     Rate and Rhythm: Normal rate.  Pulmonary:     Effort: Pulmonary effort is normal. No respiratory distress.  Skin:    General: Skin is warm and dry.  Neurological:     Mental Status: She is alert.  Psychiatric:        Behavior: Behavior is cooperative.     Vital Signs: BP (!) 150/89 (BP Location: Left Arm)   Pulse 77   Temp 99.1 F (37.3 C) (Oral)   Resp 18   Ht 5' 3"  (1.6 m)   Wt 45.3 kg   SpO2 97%   BMI 17.69 kg/m  Pain Scale: 0-10   Pain Score: Asleep   SpO2: SpO2: 97 % O2 Device:SpO2: 97 % O2 Flow Rate: .O2 Flow Rate (L/min): 2 L/min   Palliative Assessment/Data:     MDM: High    Kainon Varady Johnnette Litter, PA-C  Palliative Medicine Team Team phone # [phone]  Thank you for allowing the Palliative Medicine Team to assist in the care of this patient. Please utilize secure chat with additional questions, if there is no response within 30 minutes please call the above phone number.  Palliative Medicine Team providers are available by phone from 7am to 7pm daily and can be reached through the team cell phone.  Should this patient require assistance outside  of these hours, please call the patient's attending physician.

## 2022-01-07 NOTE — Progress Notes (Signed)
[facility] Progress Note   Subjective: Completed dialysis yesterday. Net UF 3.4L. Hyperkalemia resolved. BP improved. Feels better this am. Denies chest pain, dyspnea.   Objective Vitals:   01/07/22 0056 01/07/22 0522 01/07/22 0700 01/07/22 0825  BP:  (!) 180/102  (!) 142/98  Pulse:  90  84  Resp: (!) 24   20  Temp:  98.6 F (37 C)  98.4 F (36.9 C)  TempSrc:  Oral  Oral  SpO2:   98% 98%  Weight:  45.3 kg    Height:         Additional Objective Labs: Basic Metabolic Panel: Recent Labs  Lab 01/06/22 1046 01/06/22 1909 01/06/22 2203 01/07/22 0349  NA 140 137  --  136  K 7.2* 2.9* 3.5 3.9  CL 102 95*  --  94*  CO2 21* 26  --  27  GLUCOSE 106* 77  --  100*  BUN 114* 33*  --  40*  CREATININE 12.75* 5.13*  --  6.44*  CALCIUM 9.7 9.3  --  9.1    CBC: Recent Labs  Lab 01/06/22 0920 01/07/22 0349  WBC 4.9 4.0  NEUTROABS 3.8  --   HGB 12.3 9.8*  HCT 37.4 30.1*  MCV 92.6 92.3  PLT 411* 281    Blood Culture    Component Value Date/Time   SDES URINE, CLEAN CATCH 08/12/2021 1250   SPECREQUEST  08/12/2021 1250    NONE Performed at [facility], [place] [address] [place], [place] [address]    CULT MULTIPLE SPECIES PRESENT, SUGGEST RECOLLECTION (A) 08/12/2021 1250   REPTSTATUS 08/15/2021 FINAL 08/12/2021 1250     Physical Exam General: Chronically ill appearing, nad Heart: RRR Lungs: Clear bilaterally  Abdomen: soft non-tender  Extremities: No sig LE edema Dialysis Access: R IJ TDC   Medications:  sodium chloride      amLODipine  10 mg Oral Daily   apixaban  2.5 mg Oral BID   carvedilol  6.25 mg Oral BID WC   Chlorhexidine Gluconate Cloth  6 each Topical Q0600   cloNIDine  0.1 mg Transdermal Q Sat   diphenhydrAMINE  25 mg Oral Once   hydrALAZINE  25 mg Oral BID   isosorbide mononitrate  30 mg Oral Daily   losartan  100 mg Oral Daily   nicotine  14 mg Transdermal Daily   rosuvastatin  10 mg Oral Daily   sodium chloride flush   3 mL Intravenous Q12H   sucroferric oxyhydroxide  1,000 mg Oral BID WC   torsemide  20 mg Oral BID   Home meds include - amlodipine 10, apixaban, carvedilol 6.25 bid, clonidine patch 0.1 weekly, hydralazine 25- '50mg'$  2-3x per day, isosorbide mononitrate 30, losartan 100 qd, mirtazapine, pregabalin 100 hs, rosuvastatin, sevelamer carbonate 3 ac tid, sucroferric oxyhydroxide 1 gm ac tid, vits/ supps/ prns  Dialysis Orders:  AF TTS  3h 46mn   400/1.5  43.5kg  2/2 bath P2  Hep none  RIJ TDC - last HD 8/3 post wt 43.5kg  - mircera 50 mcg q2, last 7/18, due early Aug - iron sucrose 100 iv q hd thru 8/17 - doxercalciferol 2 ug iv tiw  Assessment/Plan: 1. HTN urgency/chest pain/dyspnea.Volume overload also contributing. Pulm edema on CXR --Improved with dialysis. UF to EDW as tolerated. Home BP meds resumed.  2. Hyperkalemia K 7.2 on arrival. Resolved with dialysis.  3. ESRD HD TTS. Next HD 8/10.  Frequent admits d/t missing dialysis. Continue to encourage compliance  with HD  4. Anemia- Hgb 12 >9.8 .Resume ESA with next HD.  5. MBD - Continue binders. Hold VDRA while here.  6. DNR status.   Lynnda Child PA-C [facility] 01/07/2022,10:46 AM

## 2022-01-07 NOTE — Progress Notes (Signed)
Pt admitted from home with hypertensive urgency. History of paroxysmal atrial fibrillation on Eliquis, chronic diastolic heart failure and noncompliance with her medications, polysubstance use. Missed HD on weekend(TTS schedule). TOC will follow for potential needs at DC.

## 2022-01-08 LAB — CBC
HCT: 29.3 % — ABNORMAL LOW (ref 36.0–46.0)
Hemoglobin: 9.3 g/dL — ABNORMAL LOW (ref 12.0–15.0)
MCH: 30.1 pg (ref 26.0–34.0)
MCHC: 31.7 g/dL (ref 30.0–36.0)
MCV: 94.8 fL (ref 80.0–100.0)
Platelets: 282 10*3/uL (ref 150–400)
RBC: 3.09 MIL/uL — ABNORMAL LOW (ref 3.87–5.11)
RDW: 19.8 % — ABNORMAL HIGH (ref 11.5–15.5)
WBC: 5 10*3/uL (ref 4.0–10.5)
nRBC: 0 % (ref 0.0–0.2)

## 2022-01-08 LAB — RENAL FUNCTION PANEL
Albumin: 3.1 g/dL — ABNORMAL LOW (ref 3.5–5.0)
Anion gap: 13 (ref 5–15)
BUN: 64 mg/dL — ABNORMAL HIGH (ref 8–23)
CO2: 26 mmol/L (ref 22–32)
Calcium: 8.9 mg/dL (ref 8.9–10.3)
Chloride: 97 mmol/L — ABNORMAL LOW (ref 98–111)
Creatinine, Ser: 9.06 mg/dL — ABNORMAL HIGH (ref 0.44–1.00)
GFR, Estimated: 4 mL/min — ABNORMAL LOW (ref >60)
Glucose, Bld: 118 mg/dL — ABNORMAL HIGH (ref 70–99)
Phosphorus: 6.8 mg/dL — ABNORMAL HIGH (ref 2.5–4.6)
Potassium: 4.2 mmol/L (ref 3.5–5.1)
Sodium: 136 mmol/L (ref 135–145)

## 2022-01-08 MED ORDER — ALTEPLASE 2 MG IJ SOLR: 2.0000 mg | Freq: Once | INTRAMUSCULAR | Status: DC | PRN

## 2022-01-08 MED ORDER — DARBEPOETIN ALFA 60 MCG/0.3ML IJ SOSY
60.0000 ug | PREFILLED_SYRINGE | Freq: Once | INTRAMUSCULAR | Status: AC
  Administered 2022-01-08: 60 ug via INTRAVENOUS
  Filled 2022-01-08: qty 0.3

## 2022-01-08 MED ORDER — HEPARIN SODIUM (PORCINE) 1000 UNIT/ML IJ SOLN
INTRAMUSCULAR | Status: AC
  Administered 2022-01-08: 1000 [IU]
  Filled 2022-01-08: qty 4

## 2022-01-08 MED ORDER — DIPHENHYDRAMINE HCL 25 MG PO CAPS
25.0000 mg | ORAL_CAPSULE | Freq: Once | ORAL | Status: AC
  Administered 2022-01-08: 25 mg via ORAL
  Filled 2022-01-08: qty 1

## 2022-01-08 MED ORDER — ANTICOAGULANT SODIUM CITRATE 4% (200MG/5ML) IV SOLN: 5.0000 mL | Status: DC | PRN

## 2022-01-08 MED ORDER — LIDOCAINE-PRILOCAINE 2.5-2.5 % EX CREA: 1.0000 | TOPICAL_CREAM | CUTANEOUS | Status: DC | PRN

## 2022-01-08 MED ORDER — HEPARIN SODIUM (PORCINE) 1000 UNIT/ML DIALYSIS: 1000.0000 [IU] | INTRAMUSCULAR | Status: DC | PRN

## 2022-01-08 MED ORDER — PENTAFLUOROPROP-TETRAFLUOROETH EX AERO: 1.0000 | INHALATION_SPRAY | CUTANEOUS | Status: DC | PRN

## 2022-01-08 MED ORDER — LIDOCAINE HCL (PF) 1 % IJ SOLN: 5.0000 mL | INTRAMUSCULAR | Status: DC | PRN

## 2022-01-08 NOTE — Progress Notes (Signed)
Pt receives out-pt HD at [facility] on TTS. Pt arrives at 11:25 for 11:45 chair time. Will assist as needed.   Melven Sartorius Renal Navigator [phone]

## 2022-01-08 NOTE — Progress Notes (Signed)
Pt in HD.

## 2022-01-08 NOTE — Progress Notes (Signed)
[facility] night cross cover note:   I was notified by RN of the patient's request for IV Dilaudid for abdominal discomfort as well as the patient's request for sleep aid.  I subsequently ordered as needed melatonin for sleep.  Additionally, as relates to the patient's abdominal discomfort, will start conservatively with initiation of IV Protonix.    Babs Bertin, DO Hospitalist

## 2022-01-08 NOTE — Progress Notes (Signed)
Daily Progress Note   Patient Name: Monique Henderson       Date: 01/08/2022 DOB: 1950/12/16  Age: 71 y.o. MRN#: [id number] Attending Physician: Charlynne Cousins, MD Primary Care Physician: Pcp, No Admit Date: 01/06/2022  Reason for Consultation/Follow-up: Establishing goals of care  Subjective: Medical records reviewed including progress notes, labs. Patient assessed at the bedside.  She reports feeling alright, no acute distress or concerns.  No family present during my visit.  Created space and opportunity for patient's thoughts and feelings after initial conversation.  She has not really reviewed MOST form or advanced directives further.  She tells me she does not have any questions about her care plan.  She expects to be going home soon, possibly tomorrow.  Outpatient palliative care was again explained and offered and patient remains agreeable.  Encouraged her to continue conversation with her loved ones and medical providers to ensure plan of care and treatment options are aligned with her values and goals.  Questions and concerns addressed. PMT will continue to support holistically.   Length of Stay: 1   Physical Exam Vitals and nursing note reviewed.  Constitutional:      General: She is not in acute distress. Cardiovascular:     Rate and Rhythm: Normal rate.  Pulmonary:     Effort: Pulmonary effort is normal.  Neurological:     Mental Status: She is alert, oriented to person, place, and time and easily aroused.  Psychiatric:        Behavior: Behavior is cooperative.            Vital Signs: BP 131/78   Pulse 78   Temp 98 F (36.7 C) (Oral)   Resp (!) 24   Ht '5\' 3"'$  (1.6 m)   Wt 42.8 kg   SpO2 96%   BMI 16.71 kg/m  SpO2: SpO2: 96 % O2 Device: O2 Device: Room  Air O2 Flow Rate: O2 Flow Rate (L/min): 2 L/min      Palliative Assessment/Data:    Palliative Care Assessment & Plan   Patient Profile: 71 y.o. female  with past medical history of ESRD on HD TTS, refractory HTN, PAF on Eliquis, chronic HFrEF, noncompliance, history of polysubstance abuse, aortic aneurysm and dissection admitted on 01/06/2022 with complaints of generalized abdominal pain, chest pain with coughing and  nausea.    Patient has had 6 admissions and 3 ED visits in the past 6 months, most recently within the last 7 days.  She is well-known to PMT. PMT has been consulted to assist with goals of care conversation.    Assessment: Goals of care conversation Hypertensive urgency, improving End-stage renal disease on dialysis Chronic combined CHF  Recommendations/Plan: Continue DNR Continue full scope treatment, goals are clear to prolong life as long as possible  Patient is not ready to complete MOST form or advance care planning documentation today TOC consulted for assistance with outpatient palliative care referral PMT will continue to follow and support as needed   Prognosis:  Unable to determine  Discharge Planning: To Be Determined  Care plan was discussed with patient   Total time: I spent 35 minutes in the care of the patient today in the above activities and documenting the encounter.    Kaylamarie Swickard Johnnette Litter, PA-C  Palliative Medicine Team Team phone # [phone]  Thank you for allowing the Palliative Medicine Team to assist in the care of this patient. Please utilize secure chat with additional questions, if there is no response within 30 minutes please call the above phone number.  Palliative Medicine Team providers are available by phone from 7am to 7pm daily and can be reached through the team cell phone.  Should this patient require assistance outside of these hours, please call the patient's attending physician.

## 2022-01-08 NOTE — Progress Notes (Addendum)
[facility] Progress Note   Subjective: Seen in HD unit. No new complaints.    Objective Vitals:   01/08/22 0539 01/08/22 0830 01/08/22 0850 01/08/22 0855  BP: (!) 193/98     Pulse: 73 77 (!) 34   Resp: (!) 27 (!) 24 14   Temp: 98.6 F (37 C) 98.1 F (36.7 C)    TempSrc: Oral Oral    SpO2: 99% 100% 95%   Weight:    46.1 kg  Height:         Additional Objective Labs: Basic Metabolic Panel: Recent Labs  Lab 01/06/22 1046 01/06/22 1909 01/06/22 2203 01/07/22 0349  NA 140 137  --  136  K 7.2* 2.9* 3.5 3.9  CL 102 95*  --  94*  CO2 21* 26  --  27  GLUCOSE 106* 77  --  100*  BUN 114* 33*  --  40*  CREATININE 12.75* 5.13*  --  6.44*  CALCIUM 9.7 9.3  --  9.1    CBC: Recent Labs  Lab 01/06/22 0920 01/07/22 0349  WBC 4.9 4.0  NEUTROABS 3.8  --   HGB 12.3 9.8*  HCT 37.4 30.1*  MCV 92.6 92.3  PLT 411* 281    Blood Culture    Component Value Date/Time   SDES URINE, CLEAN CATCH 08/12/2021 1250   SPECREQUEST  08/12/2021 1250    NONE Performed at [facility], [place] [address] [place], [place] [address]    CULT MULTIPLE SPECIES PRESENT, SUGGEST RECOLLECTION (A) 08/12/2021 1250   REPTSTATUS 08/15/2021 FINAL 08/12/2021 1250     Physical Exam General: Chronically ill appearing, nad Heart: RRR Lungs: Clear bilaterally  Abdomen: soft non-tender  Extremities: No sig LE edema Dialysis Access: R IJ TDC   Medications:  sodium chloride     anticoagulant sodium citrate      amLODipine  10 mg Oral Daily   apixaban  2.5 mg Oral BID   carvedilol  6.25 mg Oral BID WC   Chlorhexidine Gluconate Cloth  6 each Topical Q0600   cloNIDine  0.1 mg Transdermal Q Sat   diphenhydrAMINE  25 mg Oral Once in dialysis   hydrALAZINE  25 mg Oral BID   isosorbide mononitrate  30 mg Oral Daily   losartan  100 mg Oral Daily   nicotine  14 mg Transdermal Daily   rosuvastatin  10 mg Oral Daily   sodium chloride flush  3 mL Intravenous Q12H   sucroferric  oxyhydroxide  1,000 mg Oral BID WC   torsemide  20 mg Oral BID   Home meds include - amlodipine 10, apixaban, carvedilol 6.25 bid, clonidine patch 0.1 weekly, hydralazine 25- '50mg'$  2-3x per day, isosorbide mononitrate 30, losartan 100 qd, mirtazapine, pregabalin 100 hs, rosuvastatin, sevelamer carbonate 3 ac tid, sucroferric oxyhydroxide 1 gm ac tid, vits/ supps/ prns  Dialysis Orders:  AF TTS  3h 80mn   400/1.5  43.5kg  2/2 bath P2  Hep none  RIJ TDC - last HD 8/3 post wt 43.5kg  - mircera 50 mcg q2, last 7/18, due early Aug - iron sucrose 100 iv q hd thru 8/17 - doxercalciferol 2 ug iv tiw  Assessment/Plan: 1. HTN urgency/chest pain/dyspnea.Volume overload also contributing. Pulm edema on CXR --Improved with dialysis. UF to EDW as tolerated. Home BP meds resumed.  2. Hyperkalemia K 7.2 on arrival. Resolved with dialysis.  3. ESRD HD TTS. HD today.   Frequent admits d/t missing dialysis. Continue to encourage compliance  with HD  4. Anemia- Hgb 12 >9.8 .Resume ESA with HD today  5. MBD - Continue binders. Hold VDRA while here.  6. GOC - Seen by palliative care. Confirmed DNR status.   Lynnda Child PA-C [facility] 01/08/2022,9:17 AM

## 2022-01-08 NOTE — Progress Notes (Signed)
[facility] PROGRESS NOTE    Progress Note  Monique Henderson  PYP:[id number] DOB: February 06, 1951 DOA: 01/06/2022 PCP: Merryl Hacker, No     Brief Narrative:   Monique Henderson is an 71 y.o. female past medical history of end-stage renal disease on hemodialysis Tuesday Thursdays and Saturdays, paroxysmal atrial fibrillation on Eliquis, chronic diastolic heart failure and noncompliance with her medications multiple admissions for hypertensive urgency polysubstance abuse comes into the ED with abdominal pain chest pain cough and nausea.  She missed her dialysis over the weekend.  In the ED she was found to have a blood pressure of 200/134 with a heart rate of 79 satting 94% on room air.  Assessment/Plan:   Hypertensive urgency: Likely due to noncompliance, will labetalol and hydralazine IV with significant improvement in her blood pressure.  Her neck veins are still elevated on physical exam. She was placed back on her home medication, nephrology was consulted. She was started back on home regimen her blood pressure and her blood pressure slightly improved to 160/87. Going for dialysis today.  End-stage renal disease on hemodialysis/hyperkalemia/metabolic acidosis: Nephrology was consulted she was dialyzed overnight. Per nephrology.  Chronic combined systolic and diastolic heart failure: She is not in acute heart failure is likely due to hemodialysis deficiency. In case if noncompliance with her dialysis treatment. Continue strict I's and O's and daily weights. Resume losartan, Demadex and Coreg.  Abdominal pain: Lipase mildly elevated 79 She relates her abdominal pain is resolved.  Status post aortic aneurysm repair: The ED physician discussed with the patient anesthesia she just had 1 on 12/07/2021 they both shared the decision and opted not to repeat imaging. Since her chest pain has improved since her blood pressure has been less than 245 systolic.  Polysubstance abuse: UDS is  pending.  Tobacco use: Noted.   DVT prophylaxis: eliquis Family Communication:none Status is: Observation The patient will require care spanning > 2 midnights and should be moved to inpatient because: Hypertensive urgency    Code Status:     Code Status Orders  (From admission, onward)           Start     Ordered   01/06/22 1514  Do not attempt resuscitation (DNR)  Continuous       Question Answer Comment  In the event of cardiac or respiratory ARREST Do not call a "code blue"   In the event of cardiac or respiratory ARREST Do not perform Intubation, CPR, defibrillation or ACLS   In the event of cardiac or respiratory ARREST Use medication by any route, position, wound care, and other measures to relive pain and suffering. May use oxygen, suction and manual treatment of airway obstruction as needed for comfort.      01/06/22 1515           Code Status History     Date Active Date Inactive Code Status Order ID Comments User Context   12/29/2021 1109 12/30/2021 1600 DNR [id number]  Pershing Proud, NP Inpatient   12/24/2021 1616 12/29/2021 1109 Full Code [id number]  Lequita Halt, MD ED   12/24/2021 1523 12/24/2021 1616 Full Code [id number]  Candee Furbish, MD ED   12/07/2021 2310 12/08/2021 2136 Full Code [id number]  Etta Quill, DO ED   10/29/2021 1126 10/31/2021 0030 Full Code [id number]  Norval Morton, MD ED   09/15/2021 0156 09/16/2021 0001 Full Code [id number]  Erskine Emery, MD ED   08/23/2021 0858 08/24/2021 2021 Full Code [id number]  [place],  Fenton Malling, DO ED   08/12/2021 1702 08/17/2021 0101 Full Code [id number]  France Ravens, MD ED   06/19/2021 0725 06/22/2021 0049 Full Code [id number]  Phillips Grout, MD ED   06/05/2021 0144 06/09/2021 2022 Full Code [id number]  Rise Patience, MD ED   12/23/2020 1429 12/24/2020 2027 Full Code [id number]  Karmen Bongo, MD ED   09/21/2020 2313 09/27/2020 1945 Full Code [id number]  Vianne Bulls, MD ED   08/28/2019 0317 08/30/2019 2326 Full Code  [id number]  Vianne Bulls, MD ED   05/18/2019 2053 05/24/2019 1953 Full Code [id number]  Jennye Boroughs, MD Inpatient   04/07/2019 1721 04/23/2019 1432 Full Code [id number]  Wonda Olds, MD Inpatient   04/04/2019 1402 04/07/2019 1721 Full Code [id number]  Wonda Olds, MD Inpatient   01/18/2014 1123 01/19/2014 1427 Full Code [id number]  Eustace Moore, MD Inpatient   03/23/2013 1906 03/24/2013 1643 Full Code [id number]  Eustace Moore, MD Inpatient   02/27/2013 1736 03/01/2013 1937 Full Code [id number]  Otho Bellows, MD Inpatient         IV Access:   Peripheral IV   Procedures and diagnostic studies:   No results found.   Medical Consultants:   None.   Subjective:    Monique Henderson no complaints today. Objective:    Vitals:   01/08/22 1000 01/08/22 1010 01/08/22 1030 01/08/22 1101  BP: (!) 199/101 (!) 155/87 (!) 155/87 (!) 164/87  Pulse: 85 78 81 83  Resp: 18 (!) '24 17 20  '$ Temp:      TempSrc:      SpO2: 91% 96% 100% 99%  Weight:      Height:       SpO2: 99 % O2 Flow Rate (L/min): 2 L/min   Intake/Output Summary (Last 24 hours) at 01/08/2022 1141 Last data filed at 01/07/2022 2000 Gross per 24 hour  Intake 600 ml  Output --  Net 600 ml    Filed Weights   01/06/22 1854 01/07/22 0522 01/08/22 0855  Weight: 45.3 kg 45.3 kg 46.1 kg    Exam: General exam: In no acute distress. Respiratory system: Good air movement and clear to auscultation. Cardiovascular system: S1 & S2 heard, RRR. No JVD. Gastrointestinal system: Abdomen is nondistended, soft and nontender.  Extremities: No pedal edema. Skin: No rashes, lesions or ulcers Psychiatry: Judgement and insight appear normal. Mood & affect appropriate.   Data Reviewed:    Labs: Basic Metabolic Panel: Recent Labs  Lab 01/06/22 1046 01/06/22 1909 01/06/22 2203 01/07/22 0349  NA 140 137  --  136  K 7.2* 2.9* 3.5 3.9  CL 102 95*  --  94*  CO2 21* 26  --  27  GLUCOSE 106* 77  --  100*  BUN  114* 33*  --  40*  CREATININE 12.75* 5.13*  --  6.44*  CALCIUM 9.7 9.3  --  9.1    GFR Estimated Creatinine Clearance: 6 mL/min (A) (by C-G formula based on SCr of 6.44 mg/dL (H)). Liver Function Tests: Recent Labs  Lab 01/06/22 1046  AST 21  ALT 22  ALKPHOS 102  BILITOT 0.8  PROT 6.9  ALBUMIN 3.7    Recent Labs  Lab 01/06/22 1046 01/07/22 0349  LIPASE 79* 169*    No results for input(s): "AMMONIA" in the last 168 hours. Coagulation profile No results for input(s): "INR", "PROTIME" in the last 168 hours. COVID-19 Labs  No results for input(s): "DDIMER", "FERRITIN", "LDH", "CRP" in the  last 72 hours.  Lab Results  Component Value Date   SARSCOV2NAA NEGATIVE 01/06/2022   SARSCOV2NAA NEGATIVE 12/24/2021   SARSCOV2NAA NEGATIVE 10/29/2021   [place] NEGATIVE 09/15/2021    CBC: Recent Labs  Lab 01/06/22 0920 01/07/22 0349 01/08/22 0943  WBC 4.9 4.0 5.0  NEUTROABS 3.8  --   --   HGB 12.3 9.8* 9.3*  HCT 37.4 30.1* 29.3*  MCV 92.6 92.3 94.8  PLT 411* 281 282    Cardiac Enzymes: No results for input(s): "CKTOTAL", "CKMB", "CKMBINDEX", "TROPONINI" in the last 168 hours. BNP (last 3 results) No results for input(s): "PROBNP" in the last 8760 hours. CBG: No results for input(s): "GLUCAP" in the last 168 hours. D-Dimer: No results for input(s): "DDIMER" in the last 72 hours. Hgb A1c: No results for input(s): "HGBA1C" in the last 72 hours. Lipid Profile: No results for input(s): "CHOL", "HDL", "LDLCALC", "TRIG", "CHOLHDL", "LDLDIRECT" in the last 72 hours. Thyroid function studies: No results for input(s): "TSH", "T4TOTAL", "T3FREE", "THYROIDAB" in the last 72 hours.  Invalid input(s): "FREET3" Anemia work up: No results for input(s): "VITAMINB12", "FOLATE", "FERRITIN", "TIBC", "IRON", "RETICCTPCT" in the last 72 hours. Sepsis Labs: Recent Labs  Lab 01/06/22 0920 01/07/22 0349 01/08/22 0943  WBC 4.9 4.0 5.0    Microbiology Recent Results (from  the past 240 hour(s))  SARS Coronavirus 2 by RT PCR (hospital order, performed in [facility] hospital lab) *cepheid single result test* Anterior Nasal Swab     Status: None   Collection Time: 01/06/22  9:32 AM   Specimen: Anterior Nasal Swab  Result Value Ref Range Status   SARS Coronavirus 2 by RT PCR NEGATIVE NEGATIVE Final    Comment: (NOTE) SARS-CoV-2 target nucleic acids are NOT DETECTED.  The SARS-CoV-2 RNA is generally detectable in upper and lower respiratory specimens during the acute phase of infection. The lowest concentration of SARS-CoV-2 viral copies this assay can detect is 250 copies / mL. A negative result does not preclude SARS-CoV-2 infection and should not be used as the sole basis for treatment or other patient management decisions.  A negative result may occur with improper specimen collection / handling, submission of specimen other than nasopharyngeal swab, presence of viral mutation(s) within the areas targeted by this assay, and inadequate number of viral copies (<250 copies / mL). A negative result must be combined with clinical observations, patient history, and epidemiological information.  Fact Sheet for Patients:   https://www.patel.info/  Fact Sheet for Healthcare Providers: https://hall.com/  This test is not yet approved or  cleared by the [place] FDA and has been authorized for detection and/or diagnosis of SARS-CoV-2 by FDA under an Emergency Use Authorization (EUA).  This EUA will remain in effect (meaning this test can be used) for the duration of the COVID-19 declaration under Section 564(b)(1) of the Act, 21 U.S.C. section 360bbb-3(b)(1), unless the authorization is terminated or revoked sooner.  Performed at [facility], [place] [address], [place], [place] [address]      Medications:    amLODipine  10 mg Oral Daily   apixaban  2.5 mg Oral BID   carvedilol  6.25 mg Oral BID WC    Chlorhexidine Gluconate Cloth  6 each Topical Q0600   cloNIDine  0.1 mg Transdermal Q Sat   darbepoetin (ARANESP) injection - DIALYSIS  60 mcg Intravenous Once   hydrALAZINE  25 mg Oral BID   isosorbide mononitrate  30 mg Oral Daily   losartan  100 mg Oral  Daily   nicotine  14 mg Transdermal Daily   rosuvastatin  10 mg Oral Daily   sodium chloride flush  3 mL Intravenous Q12H   sucroferric oxyhydroxide  1,000 mg Oral BID WC   torsemide  20 mg Oral BID   Continuous Infusions:  sodium chloride     anticoagulant sodium citrate        LOS: 1 day   Charlynne Cousins  [facility]  01/08/2022, 11:41 AM

## 2022-01-09 DIAGNOSIS — I160 Hypertensive urgency: Secondary | ICD-10-CM | POA: Diagnosis not present

## 2022-01-09 MED ORDER — TORSEMIDE 20 MG PO TABS
40.0000 mg | ORAL_TABLET | Freq: Two times a day (BID) | ORAL | Status: DC
  Administered 2022-01-09: 40 mg via ORAL
  Filled 2022-01-09: qty 2

## 2022-01-09 MED ORDER — CLONIDINE HCL 0.2 MG/24HR TD PTWK
0.2000 mg | MEDICATED_PATCH | TRANSDERMAL | Status: DC
  Administered 2022-01-10: 0.2 mg via TRANSDERMAL
  Filled 2022-01-09: qty 1

## 2022-01-09 MED ORDER — CHLORHEXIDINE GLUCONATE CLOTH 2 % EX PADS
6.0000 | MEDICATED_PAD | Freq: Every day | CUTANEOUS | Status: DC
Start: 2022-01-09 — End: 2022-01-10
  Administered 2022-01-09: 6 via TOPICAL

## 2022-01-09 MED ORDER — DIPHENHYDRAMINE HCL 25 MG PO CAPS
25.0000 mg | ORAL_CAPSULE | Freq: Four times a day (QID) | ORAL | Status: DC | PRN
Start: 2022-01-09 — End: 2022-01-10
  Administered 2022-01-09: 25 mg via ORAL
  Filled 2022-01-09: qty 1

## 2022-01-09 MED ORDER — ISOSORBIDE MONONITRATE ER 60 MG PO TB24
60.0000 mg | ORAL_TABLET | Freq: Every day | ORAL | Status: DC
  Administered 2022-01-10: 60 mg via ORAL
  Filled 2022-01-09: qty 1

## 2022-01-09 MED ORDER — ISOSORBIDE MONONITRATE ER 30 MG PO TB24
30.0000 mg | ORAL_TABLET | Freq: Once | ORAL | Status: AC
Start: 2022-01-09 — End: 2022-01-09
  Administered 2022-01-09: 30 mg via ORAL
  Filled 2022-01-09: qty 1

## 2022-01-09 NOTE — Progress Notes (Addendum)
[facility] PROGRESS NOTE    Progress Note  Monique Henderson  SWF:[id number] DOB: 09/04/1950 DOA: 01/06/2022 PCP: Merryl Hacker, No     Brief Narrative:   Monique Henderson is an 71 y.o. female past medical history of end-stage renal disease on hemodialysis Tuesday Thursdays and Saturdays, paroxysmal atrial fibrillation on Eliquis, chronic diastolic heart failure and noncompliance with her medications multiple admissions for hypertensive urgency polysubstance abuse comes into the ED with abdominal pain chest pain cough and nausea.  She missed her dialysis over the weekend.  In the ED she was found to have a blood pressure of 200/134 with a heart rate of 79 satting 94% on room air.  Assessment/Plan:   Hypertensive urgency: Likely due to noncompliance,   Neck veins are still up in physical exam. Appreciate nephrology's assistance. She was started back on home regimen her blood pressure and her blood pressure slightly improved , but still elevated to 160/108. Increase clonidine and Imdur blood pressure remains significantly elevated.  End-stage renal disease on hemodialysis/hyperkalemia/metabolic acidosis: Nephrology was consulted she was dialyzed overnight. Per nephrology.  Chronic combined systolic and diastolic heart failure: She is not in acute heart failure is likely due to hemodialysis deficiency. In case if noncompliance with her dialysis treatment. Continue strict I's and O's and daily weights. Cont. losartan, Demadex and Coreg.  Abdominal pain: Lipase mildly elevated 79 She relates her abdominal pain is resolved. Advance her diet  Status post aortic aneurysm repair: The ED physician discussed with the patient anesthesia she just had 1 on 12/07/2021 they both shared the decision and opted not to repeat imaging. Since her chest pain has improved since her blood pressure has been less than 220 systolic.  Polysubstance abuse: UDS is pending.  Tobacco use: Noted.   DVT  prophylaxis: eliquis Family Communication:none Status is: Observation The patient will require care spanning > 2 midnights and should be moved to inpatient because: Hypertensive urgency    Code Status:     Code Status Orders  (From admission, onward)           Start     Ordered   01/06/22 1514  Do not attempt resuscitation (DNR)  Continuous       Question Answer Comment  In the event of cardiac or respiratory ARREST Do not call a "code blue"   In the event of cardiac or respiratory ARREST Do not perform Intubation, CPR, defibrillation or ACLS   In the event of cardiac or respiratory ARREST Use medication by any route, position, wound care, and other measures to relive pain and suffering. May use oxygen, suction and manual treatment of airway obstruction as needed for comfort.      01/06/22 1515           Code Status History     Date Active Date Inactive Code Status Order ID Comments User Context   12/29/2021 1109 12/30/2021 1600 DNR [id number]  Pershing Proud, NP Inpatient   12/24/2021 1616 12/29/2021 1109 Full Code [id number]  Lequita Halt, MD ED   12/24/2021 1523 12/24/2021 1616 Full Code [id number]  Candee Furbish, MD ED   12/07/2021 2310 12/08/2021 2136 Full Code [id number]  Etta Quill, DO ED   10/29/2021 1126 10/31/2021 0030 Full Code [id number]  Norval Morton, MD ED   09/15/2021 0156 09/16/2021 0001 Full Code [id number]  Erskine Emery, MD ED   08/23/2021 0858 08/24/2021 2021 Full Code [id number]  Wells Guiles, DO ED   08/12/2021 1702 08/17/2021  0101 Full Code [id number]  France Ravens, MD ED   06/19/2021 0725 06/22/2021 0049 Full Code [id number]  Phillips Grout, MD ED   06/05/2021 0144 06/09/2021 2022 Full Code [id number]  Rise Patience, MD ED   12/23/2020 1429 12/24/2020 2027 Full Code [id number]  Karmen Bongo, MD ED   09/21/2020 2313 09/27/2020 1945 Full Code [id number]  Vianne Bulls, MD ED   08/28/2019 0317 08/30/2019 2326 Full Code [id number]  Vianne Bulls, MD ED    05/18/2019 2053 05/24/2019 1953 Full Code [id number]  Jennye Boroughs, MD Inpatient   04/07/2019 1721 04/23/2019 1432 Full Code [id number]  Wonda Olds, MD Inpatient   04/04/2019 1402 04/07/2019 1721 Full Code [id number]  Wonda Olds, MD Inpatient   01/18/2014 1123 01/19/2014 1427 Full Code [id number]  Eustace Moore, MD Inpatient   03/23/2013 1906 03/24/2013 1643 Full Code [id number]  Eustace Moore, MD Inpatient   02/27/2013 1736 03/01/2013 1937 Full Code [id number]  Otho Bellows, MD Inpatient         IV Access:   Peripheral IV   Procedures and diagnostic studies:   No results found.   Medical Consultants:   None.   Subjective:    Monique Henderson no complaints today. Objective:    Vitals:   01/08/22 2339 01/09/22 0034 01/09/22 0651 01/09/22 0804  BP:  (!) 145/89 (!) 173/102 (!) 162/90  Pulse: 86 81 76 74  Resp: (!) 22 (!) 25  18  Temp:  99.2 F (37.3 C) (!) 97.4 F (36.3 C) 98.5 F (36.9 C)  TempSrc:  Oral Oral Oral  SpO2: 98% 98%  97%  Weight:      Height:       SpO2: 97 % O2 Flow Rate (L/min): 2 L/min   Intake/Output Summary (Last 24 hours) at 01/09/2022 1142 Last data filed at 01/09/2022 0819 Gross per 24 hour  Intake 240 ml  Output 202.5 ml  Net 37.5 ml    Filed Weights   01/07/22 0522 01/08/22 0855 01/08/22 1424  Weight: 45.3 kg 46.1 kg 42.8 kg    Exam: General exam: In no acute distress. Respiratory system: Good air movement and clear to auscultation. Cardiovascular system: S1 & S2 heard, RRR. No JVD. Gastrointestinal system: Abdomen is nondistended, soft and nontender.  Extremities: No pedal edema. Skin: No rashes, lesions or ulcers Psychiatry: Judgement and insight appear normal. Mood & affect appropriate.   Data Reviewed:    Labs: Basic Metabolic Panel: Recent Labs  Lab 01/06/22 1046 01/06/22 1909 01/06/22 2203 01/07/22 0349 01/08/22 0944  NA 140 137  --  136 136  K 7.2* 2.9*   < > 3.9 4.2  CL 102 95*  --  94* 97*   CO2 21* 26  --  27 26  GLUCOSE 106* 77  --  100* 118*  BUN 114* 33*  --  40* 64*  CREATININE 12.75* 5.13*  --  6.44* 9.06*  CALCIUM 9.7 9.3  --  9.1 8.9  PHOS  --   --   --   --  6.8*   < > = values in this interval not displayed.    GFR Estimated Creatinine Clearance: 4 mL/min (A) (by C-G formula based on SCr of 9.06 mg/dL (H)). Liver Function Tests: Recent Labs  Lab 01/06/22 1046 01/08/22 0944  AST 21  --   ALT 22  --   ALKPHOS 102  --   BILITOT 0.8  --   PROT 6.9  --  ALBUMIN 3.7 3.1*    Recent Labs  Lab 01/06/22 1046 01/07/22 0349  LIPASE 79* 169*    No results for input(s): "AMMONIA" in the last 168 hours. Coagulation profile No results for input(s): "INR", "PROTIME" in the last 168 hours. COVID-19 Labs  No results for input(s): "DDIMER", "FERRITIN", "LDH", "CRP" in the last 72 hours.  Lab Results  Component Value Date   SARSCOV2NAA NEGATIVE 01/06/2022   SARSCOV2NAA NEGATIVE 12/24/2021   SARSCOV2NAA NEGATIVE 10/29/2021   [place] NEGATIVE 09/15/2021    CBC: Recent Labs  Lab 01/06/22 0920 01/07/22 0349 01/08/22 0943  WBC 4.9 4.0 5.0  NEUTROABS 3.8  --   --   HGB 12.3 9.8* 9.3*  HCT 37.4 30.1* 29.3*  MCV 92.6 92.3 94.8  PLT 411* 281 282    Cardiac Enzymes: No results for input(s): "CKTOTAL", "CKMB", "CKMBINDEX", "TROPONINI" in the last 168 hours. BNP (last 3 results) No results for input(s): "PROBNP" in the last 8760 hours. CBG: No results for input(s): "GLUCAP" in the last 168 hours. D-Dimer: No results for input(s): "DDIMER" in the last 72 hours. Hgb A1c: No results for input(s): "HGBA1C" in the last 72 hours. Lipid Profile: No results for input(s): "CHOL", "HDL", "LDLCALC", "TRIG", "CHOLHDL", "LDLDIRECT" in the last 72 hours. Thyroid function studies: No results for input(s): "TSH", "T4TOTAL", "T3FREE", "THYROIDAB" in the last 72 hours.  Invalid input(s): "FREET3" Anemia work up: No results for input(s): "VITAMINB12", "FOLATE",  "FERRITIN", "TIBC", "IRON", "RETICCTPCT" in the last 72 hours. Sepsis Labs: Recent Labs  Lab 01/06/22 0920 01/07/22 0349 01/08/22 0943  WBC 4.9 4.0 5.0    Microbiology Recent Results (from the past 240 hour(s))  SARS Coronavirus 2 by RT PCR (hospital order, performed in [facility] hospital lab) *cepheid single result test* Anterior Nasal Swab     Status: None   Collection Time: 01/06/22  9:32 AM   Specimen: Anterior Nasal Swab  Result Value Ref Range Status   SARS Coronavirus 2 by RT PCR NEGATIVE NEGATIVE Final    Comment: (NOTE) SARS-CoV-2 target nucleic acids are NOT DETECTED.  The SARS-CoV-2 RNA is generally detectable in upper and lower respiratory specimens during the acute phase of infection. The lowest concentration of SARS-CoV-2 viral copies this assay can detect is 250 copies / mL. A negative result does not preclude SARS-CoV-2 infection and should not be used as the sole basis for treatment or other patient management decisions.  A negative result may occur with improper specimen collection / handling, submission of specimen other than nasopharyngeal swab, presence of viral mutation(s) within the areas targeted by this assay, and inadequate number of viral copies (<250 copies / mL). A negative result must be combined with clinical observations, patient history, and epidemiological information.  Fact Sheet for Patients:   https://www.patel.info/  Fact Sheet for Healthcare Providers: https://hall.com/  This test is not yet approved or  cleared by the [place] FDA and has been authorized for detection and/or diagnosis of SARS-CoV-2 by FDA under an Emergency Use Authorization (EUA).  This EUA will remain in effect (meaning this test can be used) for the duration of the COVID-19 declaration under Section 564(b)(1) of the Act, 21 U.S.C. section 360bbb-3(b)(1), unless the authorization is terminated or revoked  sooner.  Performed at [facility], [place] [address], [place], [place] [address]      Medications:    amLODipine  10 mg Oral Daily   apixaban  2.5 mg Oral BID   carvedilol  6.25 mg Oral  BID WC   Chlorhexidine Gluconate Cloth  6 each Topical Q0600   Chlorhexidine Gluconate Cloth  6 each Topical Q0600   cloNIDine  0.1 mg Transdermal Q Sat   hydrALAZINE  25 mg Oral BID   isosorbide mononitrate  30 mg Oral Daily   losartan  100 mg Oral Daily   nicotine  14 mg Transdermal Daily   rosuvastatin  10 mg Oral Daily   sodium chloride flush  3 mL Intravenous Q12H   sucroferric oxyhydroxide  1,000 mg Oral BID WC   torsemide  20 mg Oral BID   Continuous Infusions:  sodium chloride        LOS: 2 days   Charlynne Cousins  [facility]  01/09/2022, 11:42 AM

## 2022-01-09 NOTE — Care Management Important Message (Signed)
Important Message  Patient Details  Name: Monique Henderson MRN: [id number] Date of Birth: [birth year]   Medicare Important Message Given:  Yes     [name] 01/09/2022, 7:55 AM

## 2022-01-09 NOTE — Progress Notes (Signed)
Daily Progress Note   Patient Name: Monique Henderson       Date: 01/09/2022 DOB: March 26, 1951  Age: 71 y.o. MRN#: [id number] Attending Physician: Charlynne Cousins, MD Primary Care Physician: Pcp, No Admit Date: 01/06/2022  Reason for Consultation/Follow-up: Establishing goals of care  Subjective: Medical records reviewed including progress notes, labs. Patient assessed at the bedside.  She reports feeling well, no acute distress or concerns.  She wants to go home today.  No family present during my visit.  Continue goals of care conversation, exploring her thoughts on her current illness and care preferences, encouraging further discussion regarding MOST form and advance care planning. She states that she would likely name her sister Lurlene as primary HCPOA and her son as secondary HCPOA.  However, she would prefer to continue reviewing the provided documentation at home before proceeding with completion.  Questions and concerns addressed. PMT will continue to support holistically.   Length of Stay: 2   Physical Exam Vitals and nursing note reviewed.  Constitutional:      General: She is not in acute distress. Cardiovascular:     Rate and Rhythm: Normal rate.  Pulmonary:     Effort: Pulmonary effort is normal.  Neurological:     Mental Status: She is alert, oriented to person, place, and time and easily aroused.  Psychiatric:        Behavior: Behavior is cooperative.            Vital Signs: BP (!) 162/90 (BP Location: Left Arm)   Pulse 74   Temp 98.5 F (36.9 C) (Oral)   Resp 18   Ht '5\' 3"'$  (1.6 m)   Wt 42.8 kg   SpO2 97%   BMI 16.71 kg/m  SpO2: SpO2: 97 % O2 Device: O2 Device: Room Air O2 Flow Rate: O2 Flow Rate (L/min): 2 L/min      Palliative  Assessment/Data:    Palliative Care Assessment & Plan   Patient Profile: 71 y.o. female  with past medical history of ESRD on HD TTS, refractory HTN, PAF on Eliquis, chronic HFrEF, noncompliance, history of polysubstance abuse, aortic aneurysm and dissection admitted on 01/06/2022 with complaints of generalized abdominal pain, chest pain with coughing and nausea.    Patient has had 6 admissions and 3 ED visits in the past 6  months, most recently within the last 7 days.  She is well-known to PMT. PMT has been consulted to assist with goals of care conversation.    Assessment: Goals of care conversation Hypertensive urgency, improving End-stage renal disease on dialysis Chronic combined CHF  Recommendations/Plan: Continue DNR Continue full scope treatment Patient defers completion of MOST form or advance care planning documentation, prefers to continue review at home [place] documented in [place] with summary of our conversations during this admission Discussed her interest in discharge with attending via secure chat.  Goal remains to return home with outpatient palliative care follow-up PMT will continue to follow incrementally on an as-needed basis as goals are clear at this time.  Please secure chat or call team line should urgent needs arise   Prognosis:  Unable to determine  Discharge Planning: Home with Palliative Services  Care plan was discussed with patient   Total time: I spent 35 minutes in the care of the patient today in the above activities and documenting the encounter.    Lareina Espino Johnnette Litter, PA-C  Palliative Medicine Team Team phone # [phone]  Thank you for allowing the Palliative Medicine Team to assist in the care of this patient. Please utilize secure chat with additional questions, if there is no response within 30 minutes please call the above phone number.  Palliative Medicine Team providers are available by phone from 7am to 7pm daily and can be  reached through the team cell phone.  Should this patient require assistance outside of these hours, please call the patient's attending physician.

## 2022-01-09 NOTE — Plan of Care (Signed)

## 2022-01-09 NOTE — Progress Notes (Signed)
[facility] Progress Note   Subjective:   Seen in room, sleeping but awakens to voice. Denies SOB, CP, headache, dizziness and nausea.   Objective Vitals:   01/08/22 2339 01/09/22 0034 01/09/22 0651 01/09/22 0804  BP:  (!) 145/89 (!) 173/102 (!) 162/90  Pulse: 86 81 76 74  Resp: (!) 22 (!) 25  18  Temp:  99.2 F (37.3 C) (!) 97.4 F (36.3 C) 98.5 F (36.9 C)  TempSrc:  Oral Oral Oral  SpO2: 98% 98%  97%  Weight:      Height:       Physical Exam General: Elderly appearing female in NAD Heart: RRR, no murmurs, rubs or gallops Lungs: CTA bilaterally without wheezing, rhonchi or rales Abdomen: Soft, non-tender, non-distended, +BS Extremities: No edema b/l lower extremities Dialysis Access:  R IJ [facility]  Additional Objective Labs: Basic Metabolic Panel: Recent Labs  Lab 01/06/22 1909 01/06/22 2203 01/07/22 0349 01/08/22 0944  NA 137  --  136 136  K 2.9* 3.5 3.9 4.2  CL 95*  --  94* 97*  CO2 26  --  27 26  GLUCOSE 77  --  100* 118*  BUN 33*  --  40* 64*  CREATININE 5.13*  --  6.44* 9.06*  CALCIUM 9.3  --  9.1 8.9  PHOS  --   --   --  6.8*   Liver Function Tests: Recent Labs  Lab 01/06/22 1046 01/08/22 0944  AST 21  --   ALT 22  --   ALKPHOS 102  --   BILITOT 0.8  --   PROT 6.9  --   ALBUMIN 3.7 3.1*   Recent Labs  Lab 01/06/22 1046 01/07/22 0349  LIPASE 79* 169*   CBC: Recent Labs  Lab 01/06/22 0920 01/07/22 0349 01/08/22 0943  WBC 4.9 4.0 5.0  NEUTROABS 3.8  --   --   HGB 12.3 9.8* 9.3*  HCT 37.4 30.1* 29.3*  MCV 92.6 92.3 94.8  PLT 411* 281 282   Blood Culture    Component Value Date/Time   SDES URINE, CLEAN CATCH 08/12/2021 1250   SPECREQUEST  08/12/2021 1250    NONE Performed at [facility], [address] [place], [place] [address]    CULT MULTIPLE SPECIES PRESENT, SUGGEST RECOLLECTION (A) 08/12/2021 1250   REPTSTATUS 08/15/2021 FINAL 08/12/2021 1250    Cardiac Enzymes: No results for input(s): "CKTOTAL",  "CKMB", "CKMBINDEX", "TROPONINI" in the last 168 hours. CBG: No results for input(s): "GLUCAP" in the last 168 hours. Iron Studies: No results for input(s): "IRON", "TIBC", "TRANSFERRIN", "FERRITIN" in the last 72 hours. '@lablastinr3'$ @ Studies/Results: No results found. Medications:  sodium chloride      amLODipine  10 mg Oral Daily   apixaban  2.5 mg Oral BID   carvedilol  6.25 mg Oral BID WC   Chlorhexidine Gluconate Cloth  6 each Topical Q0600   cloNIDine  0.1 mg Transdermal Q Sat   hydrALAZINE  25 mg Oral BID   isosorbide mononitrate  30 mg Oral Daily   losartan  100 mg Oral Daily   nicotine  14 mg Transdermal Daily   rosuvastatin  10 mg Oral Daily   sodium chloride flush  3 mL Intravenous Q12H   sucroferric oxyhydroxide  1,000 mg Oral BID WC   torsemide  20 mg Oral BID    Dialysis Orders: AF TTS  3h 55mn   400/1.5  43.5kg  2/2 bath P2  Hep none  RIJ TDC -  last HD 8/3 post wt 43.5kg  - mircera 50 mcg q2, last 7/18, due early Aug - iron sucrose 100 iv q hd thru 8/17 - doxercalciferol 2 ug iv tiw  Assessment/Plan: 1. HTN urgency/chest pain/dyspnea.Volume overload also contributing. Pulm edema on CXR --Improved with dialysis. Below her outpatient EDW by weights here. Home BP meds resumed.  2. Hyperkalemia K 7.2 on arrival. Resolved with dialysis.  3. ESRD HD TTS. HD tomorrow.  Frequent admits d/t missing dialysis. Continue to encourage compliance with HD  4. Anemia- Hgb 9.3. ESA resumed with HD.  5. MBD - Phos elevated, Continue binders. Corrected calcium initially high, VDRA on hold and calcium is improved.  6. GOC - Seen by palliative care. Confirmed DNR status.   Anice Paganini, PA-C 01/09/2022, 9:27 AM  [facility] Pager: [phone]

## 2022-01-09 NOTE — TOC Initial Note (Signed)
Transition of Care [facility]) - Initial/Assessment Note    Patient Details  Name: Monique Henderson MRN: [id number] Date of Birth: Jul 29, 1950  Transition of Care [facility]) CM/SW Contact:    Zenon Mayo, RN Phone Number: 01/09/2022, 4:30 PM  Clinical Narrative:                 From home with friend, Jeneen Rinks, she states he will transport her  home at dc.  She states she does not use any assistive devices to ambulate.  [facility] has [facility] 8[facility]18, listed on AVS.  NCM received consult for outpatient palliative services. NCM offered choice, she states she is not ready to choose one yet , she wants to look over the list, this NCM left the list with her to look over will have the Weekend NCM check back with her about her choice for outpatient palliative services.   Expected Discharge Plan: Home/Self Care Barriers to Discharge: Continued Medical Work up   Patient Goals and CMS Choice Patient states their goals for this hospitalization and ongoing recovery are:: return home CMS Medicare.gov Compare Post Acute Care list provided to:: Patient Choice offered to / list presented to : Patient  Expected Discharge Plan and Services Expected Discharge Plan: Home/Self Care In-house Referral: NA Discharge Planning Services: CM Consult Post Acute Care Choice: NA Living arrangements for the past 2 months: Single Family Home                   DME Agency: NA       HH Arranged: NA          Prior Living Arrangements/Services Living arrangements for the past 2 months: Single Family Home Lives with:: Significant Other Patient language and need for interpreter reviewed:: Yes Do you feel safe going back to the place where you live?: Yes      Need for Family Participation in Patient Care: No (Comment) Care giver support system in place?: No (comment)   Criminal Activity/Legal Involvement Pertinent to Current Situation/Hospitalization: No - Comment as  needed  Activities of Daily Living Home Assistive Devices/Equipment: None ADL Screening (condition at time of admission) Patient's cognitive ability adequate to safely complete daily activities?: Yes Is the patient deaf or have difficulty hearing?: No Does the patient have difficulty seeing, even when wearing glasses/contacts?: No Does the patient have difficulty concentrating, remembering, or making decisions?: Yes Patient able to express need for assistance with ADLs?: Yes Does the patient have difficulty dressing or bathing?: No Independently performs ADLs?: Yes (appropriate for developmental age) Does the patient have difficulty walking or climbing stairs?: Yes Weakness of Legs: Both Weakness of Arms/Hands: Both  Permission Sought/Granted                  Emotional Assessment Appearance:: Appears stated age Attitude/Demeanor/Rapport: Engaged Affect (typically observed): Appropriate Orientation: : Oriented to Self, Oriented to Place, Oriented to  Time, Oriented to Situation Alcohol / Substance Use: Not Applicable Psych Involvement: No (comment)  Admission diagnosis:  Hyperkalemia [E87.5] Hypertensive urgency [I16.0] Hypertensive emergency [I16.1] Hypervolemia, unspecified hypervolemia type [E87.70] Hypertensive crisis [I16.9] Patient Active Problem List   Diagnosis Date Noted   Polysubstance abuse ([place]) 01/06/2022   Acute on chronic combined systolic and diastolic CHF (congestive heart failure) ([place]) 01/06/2022   Abdominal pain 89/21/1941   Acute metabolic encephalopathy 74/12/1446   Malignant HTN with heart disease, w/o CHF, 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 12/24/2021   History of hepatitis C  12/08/2021   Hypertensive urgency 10/29/2021   Fluid overload 10/29/2021   Acute on chronic HFrEF (heart failure with reduced ejection fraction) ([place]) 10/29/2021   Chronic anticoagulation 10/29/2021   Prolonged QT interval 10/29/2021   Leukopenia 10/29/2021   SOB (shortness of  breath) 09/15/2021   Hypertensive emergency 08/23/2021   Altered mental status    ESRD (end stage renal disease) on dialysis with hyperkalemia and metabolic acidosis with elevated AG  08/12/2021   Malignant hypertension 06/19/2021   Anemia 06/05/2021   Pure hypercholesterolemia 02/14/2021   Acute pulmonary edema ([place]) 12/23/2020   Acute respiratory failure with hypoxia ([place]) 09/21/2020   Acute renal failure superimposed on chronic kidney disease ([place]) 09/21/2020   Community acquired pneumonia 09/21/2020   Uremia 11/94/1740   Metabolic acidosis with increased anion gap and accumulation of organic acids 09/21/2020   Hypertensive crisis 09/21/2020   Troponin level elevated 08/28/2019   Positive D dimer 08/28/2019   Hyperkalemia 08/28/2019   SVT (supraventricular tachycardia) ([place]) 08/28/2019   SIRS (systemic inflammatory response syndrome) ([place]) 08/27/2019   Paroxysmal SVT (supraventricular tachycardia) ([place]) 08/27/2019   Aortic atherosclerosis ([place]) 07/05/2019   Chest pain 07/05/2019   History of CVA (cerebrovascular accident) 05/29/2019   AF (paroxysmal atrial fibrillation) ([place]) 05/29/2019   ESRD on dialysis ([place]) 05/29/2019   Cerebral thrombosis with cerebral infarction 05/22/2019   Malnutrition of moderate degree 05/19/2019   Pleural effusion 05/18/2019   Atrial fibrillation with RVR ([place]) 05/18/2019   S/P aortic aneurysm repair 04/07/2019   Aortic dissection ([place]) 04/04/2019   Dissection of aorta ([employer]) 04/03/2019   Non compliance with medical treatment 12/04/2017   Chronic obstructive lung disease ([place]) 01/16/2017   Chronic low back pain 06/22/2016   Insomnia 03/14/2015   S/P lumbar spinal fusion 01/18/2014   Tobacco use disorder 04/19/2009   GERD 09/16/2006   PCP:  Pcp, No Pharmacy:   [place], [place] - [place] [address] Phone: [phone] Fax: [phone]     Social Determinants of Health  (SDOH) Interventions    Readmission Risk Interventions    01/09/2022    4:28 PM 05/22/2019    4:31 PM  Readmission Risk Prevention Plan  Transportation Screening Complete Complete  PCP or Specialist Appt within 3-5 Days  Complete  HRI or Home Care Consult  Complete  Social Work Consult for [place] Planning/Counseling  Complete  Palliative Care Screening  Not Applicable  Medication Review Press photographer) Complete Complete  PCP or Specialist appointment within 3-5 days of discharge Complete   HRI or [place] Complete   SW Recovery Care/Counseling Consult Complete   Palliative Care Screening Complete   [place] Not Applicable

## 2022-01-10 DIAGNOSIS — I5043 Acute on chronic combined systolic (congestive) and diastolic (congestive) heart failure: Secondary | ICD-10-CM

## 2022-01-10 DIAGNOSIS — I160 Hypertensive urgency: Secondary | ICD-10-CM | POA: Diagnosis not present

## 2022-01-10 DIAGNOSIS — F1910 Other psychoactive substance abuse, uncomplicated: Secondary | ICD-10-CM

## 2022-01-10 DIAGNOSIS — I10 Essential (primary) hypertension: Secondary | ICD-10-CM | POA: Diagnosis not present

## 2022-01-10 DIAGNOSIS — N186 End stage renal disease: Secondary | ICD-10-CM | POA: Diagnosis not present

## 2022-01-10 DIAGNOSIS — I169 Hypertensive crisis, unspecified: Secondary | ICD-10-CM

## 2022-01-10 MED ORDER — HYDRALAZINE HCL 50 MG PO TABS: 50.0000 mg | ORAL_TABLET | Freq: Three times a day (TID) | ORAL | 0 refills | Status: DC

## 2022-01-10 MED ORDER — CLONIDINE 0.2 MG/24HR TD PTWK: 0.2000 mg | MEDICATED_PATCH | TRANSDERMAL | 12 refills | Status: DC

## 2022-01-10 MED ORDER — AMLODIPINE BESYLATE 10 MG PO TABS: 10.0000 mg | ORAL_TABLET | Freq: Every day | ORAL | 0 refills | Status: DC

## 2022-01-10 MED ORDER — ISOSORBIDE MONONITRATE ER 30 MG PO TB24: 60.0000 mg | ORAL_TABLET | Freq: Every day | ORAL | 0 refills | Status: DC

## 2022-01-10 MED ORDER — CARVEDILOL 6.25 MG PO TABS: 6.2500 mg | ORAL_TABLET | Freq: Two times a day (BID) | ORAL | 1 refills | Status: DC

## 2022-01-10 MED ORDER — HEPARIN SODIUM (PORCINE) 1000 UNIT/ML IJ SOLN
INTRAMUSCULAR | Status: AC
  Filled 2022-01-10: qty 3

## 2022-01-10 MED ORDER — HYDRALAZINE HCL 50 MG PO TABS
50.0000 mg | ORAL_TABLET | Freq: Two times a day (BID) | ORAL | Status: DC
  Administered 2022-01-10: 50 mg via ORAL
  Filled 2022-01-10: qty 1

## 2022-01-10 MED ORDER — TORSEMIDE 40 MG PO TABS: 40.0000 mg | ORAL_TABLET | Freq: Two times a day (BID) | ORAL | 0 refills | Status: DC

## 2022-01-10 NOTE — Discharge Summary (Signed)
Physician Discharge Summary  Monique Henderson JAS:[id number] DOB: [birth year] DOA: 01/06/2022  PCP: Merryl Hacker, No  Admit date: 01/06/2022 Discharge date: 01/10/2022  Admitted From: Home Disposition:  home  Recommendations for Outpatient Follow-up:  Follow up with PCP in 1-2 weeks Please obtain BMP/CBC in one week   Home Health:No Equipment/Devices:none  Discharge Condition:Stable CODE STATUS:Full Diet recommendation: Heart Healthy  Brief/Interim Summary: 71 y.o. female past medical history of end-stage renal disease on hemodialysis Tuesday Thursdays and Saturdays, paroxysmal atrial fibrillation on Eliquis, chronic diastolic heart failure and noncompliance with her medications multiple admissions for hypertensive urgency polysubstance abuse comes into the ED with abdominal pain chest pain cough and nausea.  She missed her dialysis over the weekend.  In the ED she was found to have a blood pressure of 200/134 with a heart rate of 79 satting 94% on room air.  Discharge Diagnoses:  Principal Problem:   Hypertensive urgency Active Problems:   Hyperkalemia   ESRD (end stage renal disease) on dialysis with hyperkalemia and metabolic acidosis with elevated AG    Acute on chronic combined systolic and diastolic CHF (congestive heart failure) (HCC)   AF (paroxysmal atrial fibrillation) (HCC)   Abdominal pain   S/P aortic aneurysm repair   Polysubstance abuse ([place])   Tobacco use disorder   Hypertensive crisis  Hypertensive urgency: Likely due to noncompliance with her medication and her dialysis. On admission neck veins are up she was started back on her home regimen and her blood pressure improved nephrology was consulted and she was dialyzed.  Stage renal disease on hemodialysis/hyperkalemia/metabolic acidosis: Negative noncompliance with her dialysis nephrology was consulted she was dialyzed on her regular schedule and her hyperkalemia and metabolic acidosis resolved.  Chronic combined  systolic and diastolic heart failure: She is not in acute heart failure it is likely due to noncompliance with her hemodialysis or hemodialysis deficiency. Continue strict I's and O's and daily weights no changes made to her medication continue losartan Demadex and Coreg.  Abdominal pain: of unclear etiology lipase was 79 possibly due to hypertensive urgency she tolerated her diet well.  Status post aortic aneurysm repair: Follow-up with vascular as an outpatient.  Polysubstance abuse: Noted.  Tobacco use: Noted.  Discharge Instructions  Discharge Instructions     Diet - low sodium heart healthy   Complete by: As directed    Increase activity slowly   Complete by: As directed       Allergies as of 01/10/2022       Reactions   Shrimp [shellfish Allergy] Shortness Of Breath   Bactroban [mupirocin] Other (See Comments)   "Sores in nose"   Vicodin [hydrocodone-acetaminophen] Itching, Nausea And Vomiting   This is patient's home medication   Eliquis [apixaban] Itching   Spoke with patient, no rash.  Has tolerated on re-challenge.   Lisinopril Cough   Tylenol [acetaminophen] Itching        Medication List     STOP taking these medications    cetirizine 10 MG tablet Commonly known as: ZYRTEC   cloNIDine 0.1 mg/24hr patch Commonly known as: CATAPRES - Dosed in mg/24 hr Replaced by: cloNIDine 0.2 mg/24hr patch       TAKE these medications    albuterol 108 (90 Base) MCG/ACT inhaler Commonly known as: VENTOLIN HFA Inhale 2 puffs into the lungs every 6 (six) hours as needed for wheezing or shortness of breath.   amLODipine 10 MG tablet Commonly known as: NORVASC Take 1 tablet (10 mg total) by mouth  daily.   apixaban 2.5 MG Tabs tablet Commonly known as: ELIQUIS Take 1 tablet (2.5 mg total) by mouth 2 (two) times daily.   carvedilol 6.25 MG tablet Commonly known as: COREG Take 1 tablet (6.25 mg total) by mouth 2 (two) times daily with a meal.   cloNIDine  0.2 mg/24hr patch Commonly known as: CATAPRES - Dosed in mg/24 hr Place 1 patch (0.2 mg total) onto the skin every Saturday. Replaces: cloNIDine 0.1 mg/24hr patch   hydrALAZINE 50 MG tablet Commonly known as: APRESOLINE Take 1 tablet (50 mg total) by mouth 3 (three) times daily. What changed: Another medication with the same name was removed. Continue taking this medication, and follow the directions you see here.   isosorbide mononitrate 30 MG 24 hr tablet Commonly known as: IMDUR Take 2 tablets (60 mg total) by mouth daily. What changed: how much to take   loperamide 2 MG capsule Commonly known as: IMODIUM Take 2 mg by mouth as needed for diarrhea or loose stools.   losartan 100 MG tablet Commonly known as: COZAAR Take 1 tablet (100 mg total) by mouth daily.   mirtazapine 7.5 MG tablet Commonly known as: REMERON Take 1 tablet (7.5 mg total) by mouth at bedtime. What changed:  when to take this reasons to take this   rosuvastatin 10 MG tablet Commonly known as: CRESTOR Take 10 mg by mouth daily. What changed: Another medication with the same name was removed. Continue taking this medication, and follow the directions you see here.   Torsemide 40 MG Tabs Take 40 mg by mouth 2 (two) times daily. What changed:  medication strength how much to take   Velphoro 500 MG chewable tablet Generic drug: sucroferric oxyhydroxide Chew 1,000 mg by mouth in the morning and at bedtime.   VITAMIN D PO Take 5,000 Units by mouth daily.        Follow-up Information     Fenton Foy, NP. Go on 01/16/2022.   Specialties: Pulmonary Disease, Endocrinology Why: '@8'$ :40am Contact information: [address], [address] [place] [address] [place] Follow up on 01/16/2022.   Why: 8:40 am for hospital follow up Contact information: [place] [address] [phone]                Allergies  Allergen Reactions   Shrimp [Shellfish Allergy] Shortness Of Breath   Bactroban [Mupirocin] Other (See Comments)    "Sores in nose"   Vicodin [Hydrocodone-Acetaminophen] Itching and Nausea And Vomiting    This is patient's home medication   Eliquis [Apixaban] Itching    Spoke with patient, no rash.  Has tolerated on re-challenge.   Lisinopril Cough   Tylenol [Acetaminophen] Itching    Consultations: Nephrology   Procedures/Studies: DG Chest Portable 1 View  Result Date: 01/06/2022 CLINICAL DATA:  Missed dialysis, hypertensive emergency, short of breath and chest tightness similar to prior. EXAM: PORTABLE CHEST - 1 VIEW COMPARISON:  12/24/2021, 12/07/2021 FINDINGS: The mediastinal contours are within normal limits. Unchanged cardiomegaly. Atherosclerotic calcification of the aortic arch. Unchanged position of indwelling right internal jugular tunneled hemodialysis catheter with the catheter tip in the right atrium. Mild bibasilar hazy opacities, slightly increased from comparison. No pneumothorax or pleural effusion. Again seen are multiple surgical clips projecting over the right axilla and right breast. No acute osseous abnormality. IMPRESSION: 1. Minimal bibasilar subsegmental atelectasis versus pulmonary edema. 2. Unchanged position  of indwelling right IJ tunneled hemodialysis catheter. 3. Unchanged cardiomegaly. Electronically Signed   By: Ruthann Cancer M.D.   On: 01/06/2022 09:45   CT Head Wo Contrast  Result Date: 12/24/2021 CLINICAL DATA:  Mental status change, dialysis patient EXAM: CT HEAD WITHOUT CONTRAST TECHNIQUE: Contiguous axial images were obtained from the base of the skull through the vertex without intravenous contrast. RADIATION DOSE REDUCTION: This exam was performed according to the departmental dose-optimization program which includes automated exposure control, adjustment of the mA and/or kV according to patient size and/or use of iterative  reconstruction technique. COMPARISON:  08/23/2021 FINDINGS: Brain: Stable atrophy pattern and extensive white matter microvascular ischemic changes throughout both cerebral hemispheres. No acute intracranial hemorrhage, mass lesion, midline shift, herniation, or hydrocephalus. No focal mass effect or edema. Cisterns are patent. No cerebellar abnormality. Vascular: No hyperdense vessel or unexpected calcification. Skull: Normal. Negative for fracture or focal lesion. Sinuses/Orbits: No acute finding. Other: None. IMPRESSION: Stable atrophy and white matter microvascular ischemic changes. No acute intracranial abnormality or interval change by noncontrast CT. Electronically Signed   By: Jerilynn Mages.  Shick M.D.   On: 12/24/2021 14:41   DG Chest Port 1 View  Result Date: 12/24/2021 CLINICAL DATA:  Altered mental status EXAM: PORTABLE CHEST 1 VIEW COMPARISON:  12/07/2021 FINDINGS: Transverse diameter of the heart is increased. There is interval decrease in pulmonary vascular congestion and clearing of pulmonary edema. There are no new focal infiltrates. There is no significant pleural effusion or pneumothorax. Metallic sutures are seen in the sternum. Tip of dialysis catheter is seen in the region of right atrium. IMPRESSION: Cardiomegaly. There are no signs of pulmonary edema or focal pulmonary consolidation. Electronically Signed   By: Elmer Picker M.D.   On: 12/24/2021 14:00   (Echo, Carotid, EGD, Colonoscopy, ERCP)    Subjective: No complaints  Discharge Exam: Vitals:   01/10/22 0752 01/10/22 0849  BP:  (!) 161/89  Pulse: 84 79  Resp: 19 (!) 25  Temp: 98 F (36.7 C)   SpO2: 100% 100%   Vitals:   01/10/22 0016 01/10/22 0449 01/10/22 0752 01/10/22 0849  BP: (!) 181/97 (!) 167/99  (!) 161/89  Pulse:  86 84 79  Resp:  20 19 (!) 25  Temp:  97.8 F (36.6 C) 98 F (36.7 C)   TempSrc:  Oral Oral   SpO2:  95% 100% 100%  Weight:      Height:        General: Pt is alert, awake, not in acute  distress Cardiovascular: RRR, S1/S2 +, no rubs, no gallops Respiratory: CTA bilaterally, no wheezing, no rhonchi Abdominal: Soft, NT, ND, bowel sounds + Extremities: no edema, no cyanosis    The results of significant diagnostics from this hospitalization (including imaging, microbiology, ancillary and laboratory) are listed below for reference.     Microbiology: Recent Results (from the past 240 hour(s))  SARS Coronavirus 2 by RT PCR (hospital order, performed in [facility] hospital lab) *cepheid single result test* Anterior Nasal Swab     Status: None   Collection Time: 01/06/22  9:32 AM   Specimen: Anterior Nasal Swab  Result Value Ref Range Status   SARS Coronavirus 2 by RT PCR NEGATIVE NEGATIVE Final    Comment: (NOTE) SARS-CoV-2 target nucleic acids are NOT DETECTED.  The SARS-CoV-2 RNA is generally detectable in upper and lower respiratory specimens during the acute phase of infection. The lowest concentration of SARS-CoV-2 viral copies this assay can detect is 250 copies /  mL. A negative result does not preclude SARS-CoV-2 infection and should not be used as the sole basis for treatment or other patient management decisions.  A negative result may occur with improper specimen collection / handling, submission of specimen other than nasopharyngeal swab, presence of viral mutation(s) within the areas targeted by this assay, and inadequate number of viral copies (<250 copies / mL). A negative result must be combined with clinical observations, patient history, and epidemiological information.  Fact Sheet for Patients:   https://www.patel.info/  Fact Sheet for Healthcare Providers: https://hall.com/  This test is not yet approved or  cleared by the [place] FDA and has been authorized for detection and/or diagnosis of SARS-CoV-2 by FDA under an Emergency Use Authorization (EUA).  This EUA will remain in effect (meaning this  test can be used) for the duration of the COVID-19 declaration under Section 564(b)(1) of the Act, 21 U.S.C. section 360bbb-3(b)(1), unless the authorization is terminated or revoked sooner.  Performed at [facility], [place] [address], [place], [place] [address]      Labs: BNP (last 3 results) Recent Labs    06/20/21 1808 07/15/21 2029 10/29/21 0840  BNP >4,500.0* >4,500.0* >8,299.3*   Basic Metabolic Panel: Recent Labs  Lab 01/06/22 1046 01/06/22 1909 01/06/22 2203 01/07/22 0349 01/08/22 0944  NA 140 137  --  136 136  K 7.2* 2.9* 3.5 3.9 4.2  CL 102 95*  --  94* 97*  CO2 21* 26  --  27 26  GLUCOSE 106* 77  --  100* 118*  BUN 114* 33*  --  40* 64*  CREATININE 12.75* 5.13*  --  6.44* 9.06*  CALCIUM 9.7 9.3  --  9.1 8.9  PHOS  --   --   --   --  6.8*   Liver Function Tests: Recent Labs  Lab 01/06/22 1046 01/08/22 0944  AST 21  --   ALT 22  --   ALKPHOS 102  --   BILITOT 0.8  --   PROT 6.9  --   ALBUMIN 3.7 3.1*   Recent Labs  Lab 01/06/22 1046 01/07/22 0349  LIPASE 79* 169*   No results for input(s): "AMMONIA" in the last 168 hours. CBC: Recent Labs  Lab 01/06/22 0920 01/07/22 0349 01/08/22 0943  WBC 4.9 4.0 5.0  NEUTROABS 3.8  --   --   HGB 12.3 9.8* 9.3*  HCT 37.4 30.1* 29.3*  MCV 92.6 92.3 94.8  PLT 411* 281 282   Cardiac Enzymes: No results for input(s): "CKTOTAL", "CKMB", "CKMBINDEX", "TROPONINI" in the last 168 hours. BNP: Invalid input(s): "POCBNP" CBG: No results for input(s): "GLUCAP" in the last 168 hours. D-Dimer No results for input(s): "DDIMER" in the last 72 hours. Hgb A1c No results for input(s): "HGBA1C" in the last 72 hours. Lipid Profile No results for input(s): "CHOL", "HDL", "LDLCALC", "TRIG", "CHOLHDL", "LDLDIRECT" in the last 72 hours. Thyroid function studies No results for input(s): "TSH", "T4TOTAL", "T3FREE", "THYROIDAB" in the last 72 hours.  Invalid input(s): "FREET3" Anemia work up No results for  input(s): "VITAMINB12", "FOLATE", "FERRITIN", "TIBC", "IRON", "RETICCTPCT" in the last 72 hours. Urinalysis    Component Value Date/Time   COLORURINE AMBER (A) 12/27/2021 1840   APPEARANCEUR TURBID (A) 12/27/2021 1840   LABSPEC 1.013 12/27/2021 1840   PHURINE 8.0 12/27/2021 1840   GLUCOSEU NEGATIVE 12/27/2021 1840   GLUCOSEU NEG mg/dL 10/30/2009 1944   HGBUR MODERATE (A) 12/27/2021 [place] NEGATIVE 12/27/2021 1840  BILIRUBINUR negative 05/04/2018 0910   KETONESUR NEGATIVE 12/27/2021 1840   PROTEINUR >=300 (A) 12/27/2021 1840   UROBILINOGEN 0.2 05/04/2018 0910   UROBILINOGEN 1 02/14/2014 0946   NITRITE NEGATIVE 12/27/2021 1840   LEUKOCYTESUR LARGE (A) 12/27/2021 1840   Sepsis Labs Recent Labs  Lab 01/06/22 0920 01/07/22 0349 01/08/22 0943  WBC 4.9 4.0 5.0   Microbiology Recent Results (from the past 240 hour(s))  SARS Coronavirus 2 by RT PCR (hospital order, performed in [facility] hospital lab) *cepheid single result test* Anterior Nasal Swab     Status: None   Collection Time: 01/06/22  9:32 AM   Specimen: Anterior Nasal Swab  Result Value Ref Range Status   SARS Coronavirus 2 by RT PCR NEGATIVE NEGATIVE Final    Comment: (NOTE) SARS-CoV-2 target nucleic acids are NOT DETECTED.  The SARS-CoV-2 RNA is generally detectable in upper and lower respiratory specimens during the acute phase of infection. The lowest concentration of SARS-CoV-2 viral copies this assay can detect is 250 copies / mL. A negative result does not preclude SARS-CoV-2 infection and should not be used as the sole basis for treatment or other patient management decisions.  A negative result may occur with improper specimen collection / handling, submission of specimen other than nasopharyngeal swab, presence of viral mutation(s) within the areas targeted by this assay, and inadequate number of viral copies (<250 copies / mL). A negative result must be combined with clinical observations,  patient history, and epidemiological information.  Fact Sheet for Patients:   https://www.patel.info/  Fact Sheet for Healthcare Providers: https://hall.com/  This test is not yet approved or  cleared by the [place] FDA and has been authorized for detection and/or diagnosis of SARS-CoV-2 by FDA under an Emergency Use Authorization (EUA).  This EUA will remain in effect (meaning this test can be used) for the duration of the COVID-19 declaration under Section 564(b)(1) of the Act, 21 U.S.C. section 360bbb-3(b)(1), unless the authorization is terminated or revoked sooner.  Performed at [facility], [place] [address], [place], [place] [address]      SIGNED:   Charlynne Cousins, MD  [facility] 01/10/2022, 9:23 AM Pager   If 7PM-7AM, please contact night-coverage [email or web address] Password [email or web address]

## 2022-01-10 NOTE — Plan of Care (Signed)
  Problem: Education: Goal: Knowledge of General Education information will improve Description: Including pain rating scale, medication(s)/side effects and non-pharmacologic comfort measures Outcome: Progressing   Problem: Health Behavior/Discharge Planning: Goal: Ability to manage health-related needs will improve Outcome: Progressing   Problem: Clinical Measurements: Goal: Respiratory complications will improve Outcome: Progressing   

## 2022-01-10 NOTE — Progress Notes (Addendum)
Received patient in bed to unit. Alert and oriented. Informed consent signed and in chart.   Treatment initiated: [place] Treatment completed: 5910  Patient tolerated well. Transported back to the room alert, without acute distress. Report given to patient's floor nurse.   Access used: Right catheter  Access issues: none  Total UF removed: 2500 mL Medication(s) given: Clonidine patch Post HD VS: 172/97 82 100% 97.6 23  Post HD weight: 42.2 kg   [facility]

## 2022-01-10 NOTE — Plan of Care (Signed)

## 2022-01-10 NOTE — Progress Notes (Signed)
[facility] Progress Note   Subjective:   Pt seen on HD. Tolerating treatment well. Reports she has had some chest pain/tightness with deep breaths since she was given IV hydralazine last night. No cough or SOB.   Objective Vitals:   01/10/22 0016 01/10/22 0449 01/10/22 0752 01/10/22 0849  BP: (!) 181/97 (!) 167/99  (!) 161/89  Pulse:  86 84 79  Resp:  20 19 (!) 25  Temp:  97.8 F (36.6 C) 98 F (36.7 C)   TempSrc:  Oral Oral   SpO2:  95% 100% 100%  Weight:      Height:       Physical Exam General: Elderly appearing female in NAD Heart: RRR, no murmurs, rubs or gallops Lungs: CTA bilaterally without wheezing, rhonchi or rales Abdomen: Soft, non-tender, non-distended, +BS Extremities: No edema b/l lower extremities Dialysis Access:  R IJ [facility]  Additional Objective Labs: Basic Metabolic Panel: Recent Labs  Lab 01/06/22 1909 01/06/22 2203 01/07/22 0349 01/08/22 0944  NA 137  --  136 136  K 2.9* 3.5 3.9 4.2  CL 95*  --  94* 97*  CO2 26  --  27 26  GLUCOSE 77  --  100* 118*  BUN 33*  --  40* 64*  CREATININE 5.13*  --  6.44* 9.06*  CALCIUM 9.3  --  9.1 8.9  PHOS  --   --   --  6.8*   Liver Function Tests: Recent Labs  Lab 01/06/22 1046 01/08/22 0944  AST 21  --   ALT 22  --   ALKPHOS 102  --   BILITOT 0.8  --   PROT 6.9  --   ALBUMIN 3.7 3.1*   Recent Labs  Lab 01/06/22 1046 01/07/22 0349  LIPASE 79* 169*   CBC: Recent Labs  Lab 01/06/22 0920 01/07/22 0349 01/08/22 0943  WBC 4.9 4.0 5.0  NEUTROABS 3.8  --   --   HGB 12.3 9.8* 9.3*  HCT 37.4 30.1* 29.3*  MCV 92.6 92.3 94.8  PLT 411* 281 282   Blood Culture    Component Value Date/Time   SDES URINE, CLEAN CATCH 08/12/2021 1250   SPECREQUEST  08/12/2021 1250    NONE Performed at [facility], [address] [place], [place] [address]    CULT MULTIPLE SPECIES PRESENT, SUGGEST RECOLLECTION (A) 08/12/2021 1250   REPTSTATUS 08/15/2021 FINAL 08/12/2021 1250    Cardiac  Enzymes: No results for input(s): "CKTOTAL", "CKMB", "CKMBINDEX", "TROPONINI" in the last 168 hours. CBG: No results for input(s): "GLUCAP" in the last 168 hours. Iron Studies: No results for input(s): "IRON", "TIBC", "TRANSFERRIN", "FERRITIN" in the last 72 hours. '@lablastinr3'$ @ Studies/Results: No results found. Medications:  sodium chloride      amLODipine  10 mg Oral Daily   apixaban  2.5 mg Oral BID   carvedilol  6.25 mg Oral BID WC   Chlorhexidine Gluconate Cloth  6 each Topical Q0600   Chlorhexidine Gluconate Cloth  6 each Topical Q0600   cloNIDine  0.2 mg Transdermal Q Sat   hydrALAZINE  25 mg Oral BID   isosorbide mononitrate  60 mg Oral Daily   losartan  100 mg Oral Daily   nicotine  14 mg Transdermal Daily   rosuvastatin  10 mg Oral Daily   sodium chloride flush  3 mL Intravenous Q12H   sucroferric oxyhydroxide  1,000 mg Oral BID WC   torsemide  40 mg Oral BID    Dialysis Orders: AF  TTS  3h 67mn   400/1.5  43.5kg  2/2 bath P2  Hep none  RIJ TDC - last HD 8/3 post wt 43.5kg  - mircera 50 mcg q2, last 7/18, due early Aug - iron sucrose 100 iv q hd thru 8/17 - doxercalciferol 2 ug iv tiw  Assessment/Plan: 1. HTN urgency/chest pain/dyspnea.Volume overload also contributing. Pulm edema on CXR. Improved with dialysis. Reports pleuritic pain today, suspect it is related to edema vs. Atelectasis as seen on CXR. Encouraged deep breaths and will see if symptoms improve with HD. Home BP meds resumed.  2. Hyperkalemia K 7.2 on arrival. Resolved with dialysis.  3. ESRD HD TTS. Frequent admits d/t missing dialysis. Continue to encourage compliance with HD  4. Anemia- Hgb 9.3. ESA resumed with HD.  5. MBD - Phos elevated, Continue binders. Corrected calcium initially high, VDRA on hold and calcium is improved.  6. GOC - Seen by palliative care. Confirmed DNR status.     SAnice Paganini PA-C 01/10/2022, 8:55 AM  [facility] Pager: [phone]

## 2022-01-10 NOTE — Care Management (Signed)
Patient in HD, unable to reach by phone, unable to leave VM to request call back for choice for palliative services.

## 2022-01-12 ENCOUNTER — Other Ambulatory Visit: Payer: Self-pay

## 2022-01-12 NOTE — Patient Outreach (Signed)
  Care Coordination [facility] Note Transition Care Management Unsuccessful Follow-up Telephone Call  Date of discharge and from where:  [facility]/10/22 [facility]  Attempts:  1st Attempt  Reason for unsuccessful TCM follow-up call:  Left voice message Peter Garter RN, [facility], [place] [facility] [phone]

## 2022-01-13 ENCOUNTER — Other Ambulatory Visit: Payer: Self-pay

## 2022-01-13 NOTE — Patient Outreach (Signed)
  Care Coordination [facility] Note Transition Care Management Unsuccessful Follow-up Telephone Call  Date of discharge and from where:  [facility]/10/22 [facility]  Attempts:  2nd Attempt  Reason for unsuccessful TCM follow-up call:  Left voice message Peter Garter RN, [facility] [place], [place] [facility] [phone]

## 2022-01-14 ENCOUNTER — Other Ambulatory Visit: Payer: Self-pay | Admitting: *Deleted

## 2022-01-14 NOTE — Patient Outreach (Signed)
  Care Coordination [facility] Note Transition Care Management Unsuccessful Follow-up Telephone Call  Date of discharge and from where:  [phone] [facility]  Attempts:  3rd Attempt  Reason for unsuccessful TCM follow-up call:  Unable to leave message  [place] [facility] [phone]

## 2022-01-16 ENCOUNTER — Inpatient Hospital Stay: Payer: Medicare Other | Admitting: Nurse Practitioner

## 2022-01-29 ENCOUNTER — Emergency Department (HOSPITAL_COMMUNITY): Payer: Medicare Other

## 2022-01-29 ENCOUNTER — Encounter (HOSPITAL_COMMUNITY): Payer: Self-pay | Admitting: *Deleted

## 2022-01-29 ENCOUNTER — Other Ambulatory Visit: Payer: Self-pay

## 2022-01-29 ENCOUNTER — Observation Stay (HOSPITAL_COMMUNITY)
Admission: EM | Admit: 2022-01-29 | Discharge: 2022-01-30 | Disposition: A | Discharge status: Home / Self Care | Payer: Medicare Other | Attending: Internal Medicine | Admitting: Internal Medicine

## 2022-01-29 DIAGNOSIS — J449 Chronic obstructive pulmonary disease, unspecified: Secondary | ICD-10-CM | POA: Insufficient documentation

## 2022-01-29 DIAGNOSIS — Z66 Do not resuscitate: Secondary | ICD-10-CM | POA: Diagnosis not present

## 2022-01-29 DIAGNOSIS — R079 Chest pain, unspecified: Secondary | ICD-10-CM | POA: Diagnosis present

## 2022-01-29 DIAGNOSIS — R9431 Abnormal electrocardiogram [ECG] [EKG]: Secondary | ICD-10-CM | POA: Diagnosis not present

## 2022-01-29 DIAGNOSIS — I4891 Unspecified atrial fibrillation: Secondary | ICD-10-CM

## 2022-01-29 DIAGNOSIS — Z992 Dependence on renal dialysis: Secondary | ICD-10-CM | POA: Diagnosis not present

## 2022-01-29 DIAGNOSIS — I161 Hypertensive emergency: Secondary | ICD-10-CM | POA: Insufficient documentation

## 2022-01-29 DIAGNOSIS — N186 End stage renal disease: Secondary | ICD-10-CM | POA: Diagnosis not present

## 2022-01-29 DIAGNOSIS — Z7901 Long term (current) use of anticoagulants: Secondary | ICD-10-CM | POA: Diagnosis not present

## 2022-01-29 DIAGNOSIS — Z681 Body mass index (BMI) 19 or less, adult: Secondary | ICD-10-CM | POA: Diagnosis not present

## 2022-01-29 DIAGNOSIS — Z79899 Other long term (current) drug therapy: Secondary | ICD-10-CM | POA: Diagnosis not present

## 2022-01-29 DIAGNOSIS — I10 Essential (primary) hypertension: Secondary | ICD-10-CM | POA: Diagnosis present

## 2022-01-29 DIAGNOSIS — E669 Obesity, unspecified: Secondary | ICD-10-CM | POA: Diagnosis not present

## 2022-01-29 DIAGNOSIS — Z8673 Personal history of transient ischemic attack (TIA), and cerebral infarction without residual deficits: Secondary | ICD-10-CM | POA: Diagnosis not present

## 2022-01-29 DIAGNOSIS — I5043 Acute on chronic combined systolic (congestive) and diastolic (congestive) heart failure: Secondary | ICD-10-CM | POA: Diagnosis not present

## 2022-01-29 DIAGNOSIS — I248 Other forms of acute ischemic heart disease: Secondary | ICD-10-CM | POA: Insufficient documentation

## 2022-01-29 DIAGNOSIS — I480 Paroxysmal atrial fibrillation: Secondary | ICD-10-CM | POA: Diagnosis not present

## 2022-01-29 DIAGNOSIS — Z20822 Contact with and (suspected) exposure to covid-19: Secondary | ICD-10-CM | POA: Diagnosis not present

## 2022-01-29 DIAGNOSIS — E8770 Fluid overload, unspecified: Secondary | ICD-10-CM | POA: Diagnosis not present

## 2022-01-29 DIAGNOSIS — F17210 Nicotine dependence, cigarettes, uncomplicated: Secondary | ICD-10-CM | POA: Diagnosis not present

## 2022-01-29 DIAGNOSIS — N289 Disorder of kidney and ureter, unspecified: Secondary | ICD-10-CM

## 2022-01-29 DIAGNOSIS — R778 Other specified abnormalities of plasma proteins: Secondary | ICD-10-CM | POA: Diagnosis not present

## 2022-01-29 DIAGNOSIS — I132 Hypertensive heart and chronic kidney disease with heart failure and with stage 5 chronic kidney disease, or end stage renal disease: Secondary | ICD-10-CM | POA: Insufficient documentation

## 2022-01-29 DIAGNOSIS — E785 Hyperlipidemia, unspecified: Secondary | ICD-10-CM | POA: Diagnosis present

## 2022-01-29 DIAGNOSIS — Z853 Personal history of malignant neoplasm of breast: Secondary | ICD-10-CM | POA: Diagnosis not present

## 2022-01-29 DIAGNOSIS — R7989 Other specified abnormal findings of blood chemistry: Secondary | ICD-10-CM | POA: Diagnosis present

## 2022-01-29 LAB — I-STAT CHEM 8, ED
BUN: 49 mg/dL — ABNORMAL HIGH (ref 8–23)
Calcium, Ion: 1.08 mmol/L — ABNORMAL LOW (ref 1.15–1.40)
Chloride: 99 mmol/L (ref 98–111)
Creatinine, Ser: 8.8 mg/dL — ABNORMAL HIGH (ref 0.44–1.00)
Glucose, Bld: 113 mg/dL — ABNORMAL HIGH (ref 70–99)
HCT: 34 % — ABNORMAL LOW (ref 36.0–46.0)
Hemoglobin: 11.6 g/dL — ABNORMAL LOW (ref 12.0–15.0)
Potassium: 4.8 mmol/L (ref 3.5–5.1)
Sodium: 137 mmol/L (ref 135–145)
TCO2: 28 mmol/L (ref 22–32)

## 2022-01-29 LAB — RESP PANEL BY RT-PCR (FLU A&B, COVID) ARPGX2
Influenza A by PCR: NEGATIVE
Influenza B by PCR: NEGATIVE
SARS Coronavirus 2 by RT PCR: NEGATIVE

## 2022-01-29 LAB — I-STAT ARTERIAL BLOOD GAS, ED
Acid-Base Excess: 7 mmol/L — ABNORMAL HIGH (ref 0.0–2.0)
Bicarbonate: 30.2 mmol/L — ABNORMAL HIGH (ref 20.0–28.0)
Calcium, Ion: 1.08 mmol/L — ABNORMAL LOW (ref 1.15–1.40)
HCT: 34 % — ABNORMAL LOW (ref 36.0–46.0)
Hemoglobin: 11.6 g/dL — ABNORMAL LOW (ref 12.0–15.0)
O2 Saturation: 93 %
Potassium: 4.8 mmol/L (ref 3.5–5.1)
Sodium: 136 mmol/L (ref 135–145)
TCO2: 31 mmol/L (ref 22–32)
pCO2 arterial: 38 mmHg (ref 32–48)
pH, Arterial: 7.508 — ABNORMAL HIGH (ref 7.35–7.45)
pO2, Arterial: 61 mmHg — ABNORMAL LOW (ref 83–108)

## 2022-01-29 LAB — CBC WITH DIFFERENTIAL/PLATELET
Abs Immature Granulocytes: 0.04 10*3/uL (ref 0.00–0.07)
Basophils Absolute: 0 10*3/uL (ref 0.0–0.1)
Basophils Relative: 1 %
Eosinophils Absolute: 0.1 10*3/uL (ref 0.0–0.5)
Eosinophils Relative: 1 %
HCT: 32.1 % — ABNORMAL LOW (ref 36.0–46.0)
Hemoglobin: 10.3 g/dL — ABNORMAL LOW (ref 12.0–15.0)
Immature Granulocytes: 1 %
Lymphocytes Relative: 10 %
Lymphs Abs: 0.7 10*3/uL (ref 0.7–4.0)
MCH: 31.1 pg (ref 26.0–34.0)
MCHC: 32.1 g/dL (ref 30.0–36.0)
MCV: 97 fL (ref 80.0–100.0)
Monocytes Absolute: 0.7 10*3/uL (ref 0.1–1.0)
Monocytes Relative: 10 %
Neutro Abs: 5.7 10*3/uL (ref 1.7–7.7)
Neutrophils Relative %: 77 %
Platelets: 281 10*3/uL (ref 150–400)
RBC: 3.31 MIL/uL — ABNORMAL LOW (ref 3.87–5.11)
RDW: 18.5 % — ABNORMAL HIGH (ref 11.5–15.5)
WBC: 7.2 10*3/uL (ref 4.0–10.5)
nRBC: 0 % (ref 0.0–0.2)

## 2022-01-29 LAB — PROTIME-INR
INR: 1.1 (ref 0.8–1.2)
Prothrombin Time: 13.8 seconds (ref 11.4–15.2)

## 2022-01-29 LAB — HEPATITIS B SURFACE ANTIBODY,QUALITATIVE: Hep B S Ab: REACTIVE — AB

## 2022-01-29 LAB — BASIC METABOLIC PANEL
Anion gap: 15 (ref 5–15)
BUN: 41 mg/dL — ABNORMAL HIGH (ref 8–23)
CO2: 27 mmol/L (ref 22–32)
Calcium: 10 mg/dL (ref 8.9–10.3)
Chloride: 98 mmol/L (ref 98–111)
Creatinine, Ser: 7.97 mg/dL — ABNORMAL HIGH (ref 0.44–1.00)
GFR, Estimated: 5 mL/min — ABNORMAL LOW (ref >60)
Glucose, Bld: 109 mg/dL — ABNORMAL HIGH (ref 70–99)
Potassium: 4.6 mmol/L (ref 3.5–5.1)
Sodium: 140 mmol/L (ref 135–145)

## 2022-01-29 LAB — MAGNESIUM: Magnesium: 2.5 mg/dL — ABNORMAL HIGH (ref 1.7–2.4)

## 2022-01-29 LAB — TYPE AND SCREEN
ABO/RH(D): O POS
Antibody Screen: NEGATIVE

## 2022-01-29 LAB — HEPATITIS C ANTIBODY: HCV Ab: REACTIVE — AB

## 2022-01-29 LAB — BRAIN NATRIURETIC PEPTIDE: B Natriuretic Peptide: >4500 pg/mL — ABNORMAL HIGH (ref 0.0–100.0)

## 2022-01-29 LAB — APTT: aPTT: 33 seconds (ref 24–36)

## 2022-01-29 LAB — TROPONIN I (HIGH SENSITIVITY)
Troponin I (High Sensitivity): 144 ng/L (ref <18)
Troponin I (High Sensitivity): 141 ng/L (ref <18)
Troponin I (High Sensitivity): 163 ng/L (ref <18)

## 2022-01-29 LAB — HEPATITIS B CORE ANTIBODY, TOTAL: Hep B Core Total Ab: NONREACTIVE

## 2022-01-29 LAB — HEPATITIS B SURFACE ANTIGEN: Hepatitis B Surface Ag: NONREACTIVE

## 2022-01-29 MED ORDER — ONDANSETRON HCL 4 MG/2ML IJ SOLN
4.0000 mg | Freq: Once | INTRAMUSCULAR | Status: AC
  Administered 2022-01-29: 4 mg via INTRAVENOUS
  Filled 2022-01-29: qty 2

## 2022-01-29 MED ORDER — DOXYCYCLINE HYCLATE 100 MG PO TABS
100.0000 mg | ORAL_TABLET | Freq: Once | ORAL | Status: AC
  Administered 2022-01-29: 100 mg via ORAL
  Filled 2022-01-29: qty 1

## 2022-01-29 MED ORDER — HYDRALAZINE HCL 20 MG/ML IJ SOLN
5.0000 mg | INTRAMUSCULAR | Status: DC | PRN
  Administered 2022-01-29: 5 mg via INTRAVENOUS
  Filled 2022-01-29: qty 1

## 2022-01-29 MED ORDER — SEVELAMER CARBONATE 2.4 G PO PACK
2.4000 g | PACK | Freq: Three times a day (TID) | ORAL | Status: DC
  Administered 2022-01-29 – 2022-01-30 (×2): 2.4 g via ORAL
  Filled 2022-01-29 (×4): qty 1

## 2022-01-29 MED ORDER — IOHEXOL 350 MG/ML SOLN
100.0000 mL | Freq: Once | INTRAVENOUS | Status: AC | PRN
  Administered 2022-01-29: 100 mL via INTRAVENOUS

## 2022-01-29 MED ORDER — HYDROMORPHONE HCL 1 MG/ML IJ SOLN
0.5000 mg | Freq: Once | INTRAMUSCULAR | Status: AC | PRN
  Administered 2022-01-30: 0.5 mg via INTRAVENOUS
  Filled 2022-01-29 (×2): qty 0.5

## 2022-01-29 MED ORDER — CARVEDILOL 6.25 MG PO TABS
6.2500 mg | ORAL_TABLET | Freq: Two times a day (BID) | ORAL | Status: DC
  Administered 2022-01-29 – 2022-01-30 (×2): 6.25 mg via ORAL
  Filled 2022-01-29: qty 1
  Filled 2022-01-29: qty 2

## 2022-01-29 MED ORDER — ISOSORBIDE MONONITRATE ER 30 MG PO TB24
30.0000 mg | ORAL_TABLET | Freq: Every day | ORAL | Status: DC
  Administered 2022-01-29 – 2022-01-30 (×2): 30 mg via ORAL
  Filled 2022-01-29 (×2): qty 1

## 2022-01-29 MED ORDER — ROSUVASTATIN CALCIUM 5 MG PO TABS
10.0000 mg | ORAL_TABLET | Freq: Every day | ORAL | Status: DC
  Administered 2022-01-29 – 2022-01-30 (×2): 10 mg via ORAL
  Filled 2022-01-29 (×2): qty 2

## 2022-01-29 MED ORDER — SODIUM CHLORIDE 0.9 % IV SOLN
1.0000 g | INTRAVENOUS | Status: AC
  Administered 2022-01-29: 1 g via INTRAVENOUS
  Filled 2022-01-29: qty 10

## 2022-01-29 MED ORDER — ESMOLOL HCL-SODIUM CHLORIDE 2000 MG/100ML IV SOLN
25.0000 ug/kg/min | INTRAVENOUS | Status: DC
  Administered 2022-01-29: 25 ug/kg/min via INTRAVENOUS
  Filled 2022-01-29: qty 100

## 2022-01-29 MED ORDER — HYDRALAZINE HCL 50 MG PO TABS
50.0000 mg | ORAL_TABLET | Freq: Three times a day (TID) | ORAL | Status: DC
  Administered 2022-01-30 (×2): 50 mg via ORAL
  Filled 2022-01-29: qty 1
  Filled 2022-01-29: qty 2
  Filled 2022-01-29: qty 1

## 2022-01-29 MED ORDER — CLONIDINE HCL 0.2 MG/24HR TD PTWK: 0.2000 mg | MEDICATED_PATCH | TRANSDERMAL | Status: DC

## 2022-01-29 MED ORDER — AMLODIPINE BESYLATE 10 MG PO TABS
10.0000 mg | ORAL_TABLET | Freq: Every day | ORAL | Status: DC
  Administered 2022-01-29 – 2022-01-30 (×2): 10 mg via ORAL
  Filled 2022-01-29: qty 2
  Filled 2022-01-29: qty 1

## 2022-01-29 MED ORDER — CAMPHOR-MENTHOL 0.5-0.5 % EX LOTN: 1.0000 | TOPICAL_LOTION | Freq: Three times a day (TID) | CUTANEOUS | Status: DC | PRN

## 2022-01-29 MED ORDER — CHLORHEXIDINE GLUCONATE CLOTH 2 % EX PADS: 6.0000 | MEDICATED_PAD | Freq: Every day | CUTANEOUS | Status: DC

## 2022-01-29 MED ORDER — NEPRO/CARBSTEADY PO LIQD: 237.0000 mL | Freq: Three times a day (TID) | ORAL | Status: DC | PRN

## 2022-01-29 MED ORDER — MIRTAZAPINE 15 MG PO TABS: 7.5000 mg | ORAL_TABLET | Freq: Every evening | ORAL | Status: DC | PRN

## 2022-01-29 MED ORDER — ONDANSETRON HCL 4 MG/2ML IJ SOLN: 4.0000 mg | Freq: Four times a day (QID) | INTRAMUSCULAR | Status: DC | PRN

## 2022-01-29 MED ORDER — METOPROLOL TARTRATE 5 MG/5ML IV SOLN
5.0000 mg | INTRAVENOUS | Status: AC
  Administered 2022-01-29: 5 mg via INTRAVENOUS
  Filled 2022-01-29: qty 5

## 2022-01-29 MED ORDER — DOCUSATE SODIUM 283 MG RE ENEM: 1.0000 | ENEMA | RECTAL | Status: DC | PRN

## 2022-01-29 MED ORDER — SORBITOL 70 % SOLN: 30.0000 mL | Status: DC | PRN

## 2022-01-29 MED ORDER — NALOXONE HCL 0.4 MG/ML IJ SOLN: 0.4000 mg | INTRAMUSCULAR | Status: DC | PRN

## 2022-01-29 MED ORDER — ALBUTEROL SULFATE (2.5 MG/3ML) 0.083% IN NEBU
2.5000 mg | INHALATION_SOLUTION | Freq: Four times a day (QID) | RESPIRATORY_TRACT | Status: DC | PRN
Start: 2022-01-29 — End: 2022-01-30

## 2022-01-29 MED ORDER — ONDANSETRON HCL 4 MG PO TABS: 4.0000 mg | ORAL_TABLET | Freq: Four times a day (QID) | ORAL | Status: DC | PRN

## 2022-01-29 MED ORDER — TORSEMIDE 20 MG PO TABS
40.0000 mg | ORAL_TABLET | Freq: Two times a day (BID) | ORAL | Status: DC
  Administered 2022-01-29 – 2022-01-30 (×2): 40 mg via ORAL
  Filled 2022-01-29 (×2): qty 2

## 2022-01-29 MED ORDER — ESMOLOL BOLUS VIA INFUSION
500.0000 ug/kg | Freq: Once | INTRAVENOUS | Status: AC
  Administered 2022-01-29: 22700 ug via INTRAVENOUS

## 2022-01-29 MED ORDER — LOSARTAN POTASSIUM 50 MG PO TABS
100.0000 mg | ORAL_TABLET | Freq: Every day | ORAL | Status: DC
  Administered 2022-01-30: 100 mg via ORAL
  Filled 2022-01-29 (×2): qty 2

## 2022-01-29 MED ORDER — SODIUM CHLORIDE 0.9% FLUSH
3.0000 mL | Freq: Two times a day (BID) | INTRAVENOUS | Status: DC
Start: 2022-01-29 — End: 2022-01-30
  Administered 2022-01-30 (×2): 3 mL via INTRAVENOUS

## 2022-01-29 MED ORDER — HYDROMORPHONE HCL 1 MG/ML IJ SOLN
0.5000 mg | Freq: Once | INTRAMUSCULAR | Status: AC
  Administered 2022-01-29: 0.5 mg via INTRAVENOUS
  Filled 2022-01-29: qty 1

## 2022-01-29 MED ORDER — CALCIUM CARBONATE ANTACID 1250 MG/5ML PO SUSP: 500.0000 mg | Freq: Four times a day (QID) | ORAL | Status: DC | PRN

## 2022-01-29 MED ORDER — HYDROXYZINE HCL 25 MG PO TABS
25.0000 mg | ORAL_TABLET | Freq: Three times a day (TID) | ORAL | Status: DC | PRN
  Administered 2022-01-30: 25 mg via ORAL
  Filled 2022-01-29: qty 1

## 2022-01-29 MED ORDER — APIXABAN 2.5 MG PO TABS
2.5000 mg | ORAL_TABLET | Freq: Two times a day (BID) | ORAL | Status: DC
  Administered 2022-01-29 – 2022-01-30 (×2): 2.5 mg via ORAL
  Filled 2022-01-29 (×2): qty 1

## 2022-01-29 MED ORDER — ZOLPIDEM TARTRATE 5 MG PO TABS: 5.0000 mg | ORAL_TABLET | Freq: Every evening | ORAL | Status: DC | PRN

## 2022-01-29 NOTE — Progress Notes (Signed)
Ms. Trager is alert and oriented x4. Connected to cardiac monitoring. Skin assessed with Ferne Coe. CHG bath given. Oriented to room, call light, and bed control. Bed placed in lowest position.

## 2022-01-29 NOTE — ED Notes (Signed)
IV team at bedside 

## 2022-01-29 NOTE — ED Notes (Signed)
ED TO INPATIENT HANDOFF REPORT  ED Nurse Name and Phone #: Caryl Pina [phone]  R Name/Age/Gender Monique Henderson 71 y.o. female Room/Bed: 035C/035C  Code Status   Code Status: DNR  Home/SNF/Other Home Patient oriented to: self, place, time, and situation Is this baseline? Yes   Triage Complete: Triage complete  Chief Complaint Hypervolemia associated with renal insufficiency [E87.70, N28.9]  Triage Note Patient presents to ed via [facility] states she is sob and c/o chest pain and abd. Pain, states she is on dialysis T-TH-S, was last at dialysis on tues, states she was to weak to go today.    Allergies Allergies  Allergen Reactions   Shrimp [Shellfish Allergy] Shortness Of Breath   Bactroban [Mupirocin] Other (See Comments)    "Sores in nose"   Tylenol [Acetaminophen] Itching   Zestril [Lisinopril] Cough    Level of Care/Admitting Diagnosis ED Disposition     ED Disposition  Admit   Condition  --   [place]: [place] [100100]  Level of Care: Progressive [102]  Admit to Progressive based on following criteria: NEPHROLOGY stable condition requiring close monitoring for AKI, requiring Hemodialysis or Peritoneal Dialysis either from expected electrolyte imbalance, acidosis, or fluid overload that can be managed by NIPPV or high flow oxygen.  May place patient in observation at [facility] or [facility] if equivalent level of care is available:: No  Covid Evaluation: Confirmed COVID Negative  Diagnosis: Hypervolemia associated with renal insufficiency [[id number]]  Admitting Physician: Karmen Bongo [[id number]]  Attending Physician: Karmen Bongo [[id number]]          B Medical/Surgery History Past Medical History:  Diagnosis Date   AF (paroxysmal atrial fibrillation) ([place]) 05/29/2019   on Coumadin   Aortic atherosclerosis ([place]) 07/05/2019   Aortic dissection ([place]) 04/04/2019   s/p repair   Bone spur 2008   Right calcaneal foot spur    Breast cancer ([place]) 2004   Ductal carcinoma in situ of the left breast; S/P left partial mastectomy 02/26/2003; S/P re-excision of cranial and lateral margins11/18/2004.radiation   Cerebral thrombosis with cerebral infarction 05/22/2019   Chronic low back pain 06/22/2016   Chronic obstructive lung disease ([place]) 01/16/2017   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 Autumn Messing; S/P re-excision of superior and inferior margins 10/27/2012.    ESRD on hemodialysis ([place]) 05/29/2019   Essential hypertension 09/16/2006   GERD 09/16/2006   Hepatitis C    treated and RNA confirmed not detectable 01/2017   Insomnia 03/14/2015   Malnutrition of moderate degree 05/19/2019   Non compliance with medical treatment 12/04/2017   Normocytic anemia    With thrombocytosis   Osteoarthritis    Right ureteral stone 2002   S/P lumbar spinal fusion 01/18/2014   S/P lumbar decompressive laminectomy, fusion, and plating for lumbar spinal stenosis on 05/27/2009 by Dr. Eustace Moore.  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 Eustace Moore.     Tobacco use disorder 04/19/2009   Uterine fibroid    Wears dentures    top   Past Surgical History:  Procedure Laterality Date   ANTERIOR LAT LUMBAR FUSION N/A 01/18/2014   Procedure: ANTERIOR LATERAL LUMBAR FUSION LUMBAR TWO-THREE;  Surgeon: Eustace Moore, MD;  Location: [place] [facility];  Service:  Neurosurgery;  Laterality: N/A;   ANTERIOR LUMBAR FUSION  01/18/2014   AV FISTULA PLACEMENT Left 04/20/2019   Procedure: ARTERIOVENOUS (AV) FISTULA CREATION;  Surgeon: [name], MD;  Location: [facility];  Service: Vascular;  Laterality: Left;   BACK SURGERY     BREAST LUMPECTOMY Left 01/2003   BREAST LUMPECTOMY Right 2014    BREAST LUMPECTOMY WITH NEEDLE LOCALIZATION AND AXILLARY SENTINEL LYMPH NODE BX Right 10/13/2012   Procedure: BREAST LUMPECTOMY WITH NEEDLE LOCALIZATION;  Surgeon: [name], MD;  Location: [facility];  Service: General;  Laterality: Right;  Right breast wire localized lumpectomy   INSERTION OF DIALYSIS CATHETER Right 04/20/2019   Procedure: INSERTION OF DIALYSIS CATHETER, right internal jugular;  Surgeon: [name], MD;  Location: [facility];  Service: Vascular;  Laterality: Right;   INSERTION OF DIALYSIS CATHETER Right 09/24/2020   Procedure: INSERTION OF TUNNELED DIALYSIS CATHETER;  Surgeon: Rosetta Posner, MD;  Location: [facility];  Service: Vascular;  Laterality: Right;   IR FLUORO GUIDE CV LINE RIGHT  09/22/2020   IR THORACENTESIS ASP PLEURAL SPACE W/IMG GUIDE  05/19/2019   IR US GUIDE VASC ACCESS LEFT  09/22/2020   IR US GUIDE VASC ACCESS RIGHT  09/22/2020   IR VENOCAVAGRAM SVC  09/22/2020   LAMINECTOMY  05/27/2009   Lumbar decompressive laminectomy, fusion and plating for lumbar spinal stensosis   LIGATION OF ARTERIOVENOUS  FISTULA Left 09/24/2020   Procedure: LIGATION OF LEFT ARM ARTERIOVENOUS  FISTULA;  Surgeon: Rosetta Posner, MD;  Location: [facility];  Service: Vascular;  Laterality: Left;   LUMBAR LAMINECTOMY/DECOMPRESSION MICRODISCECTOMY Left 03/23/2013   Procedure: LUMBAR LAMINECTOMY/DECOMPRESSION MICRODISCECTOMY 1 LEVEL;  Surgeon: Eustace Moore, MD;  Location: [facility];  Service: Neurosurgery;  Laterality: Left;  LUMBAR LAMINECTOMY/DECOMPRESSION MICRODISCECTOMY 1 LEVEL   MASTECTOMY, PARTIAL Left 02/26/2003   ; S/P re-excision of cranial and lateral margins 04/19/2003.    RE-EXCISION OF BREAST CANCER,SUPERIOR MARGINS Right 10/27/2012   Procedure: RE-EXCISION OF BREAST CANCER,SUPERIOR and inferior MARGINS;  Surgeon: [name], MD;  Location: [facility];  Service: General;  Laterality: Right;   RE-EXCISION OF BREAST LUMPECTOMY Left 04/2003   TEE WITHOUT  CARDIOVERSION N/A 04/04/2019   Procedure: Transesophageal Echocardiogram ([place]);  Surgeon: Wonda Olds, MD;  Location: [facility];  Service: Open Heart Surgery;  Laterality: N/A;   THORACIC AORTIC ANEURYSM REPAIR N/A 04/04/2019   Procedure: THORACIC ASCENDING ANEURYSM REPAIR (AAA)  USING 28 MM X 30 CM HEMASHIELD PLATINUM VASCULAR GRAFT;  Surgeon: Wonda Olds, MD;  Location[facility];  Service: Open Heart Surgery;  Laterality: N/A;     A IV Location/Drains/Wounds Patient Lines/Drains/Airways Status     Active Line/Drains/Airways     Name Placement date Placement time Site Days   Peripheral IV 01/29/22 20 G Left Other (Comment) 01/29/22  1037  Other (Comment)  less than 1   Peripheral IV 01/29/22 20 G 1.88" Anterior;Left;Upper Arm 01/29/22  1409  Arm  less than 1   Hemodialysis Catheter Right Subclavian --  --  Subclavian  --   Hemodialysis Catheter Right Subclavian --  --  Subclavian  --   Incision (Closed) 09/24/20 Arm Left 09/24/20  1317  -- 492   Incision (Closed) 09/24/20 Chest Right 09/24/20  1317  -- 492            Intake/Output Last 24 hours No intake or output data in the 24 hours ending 01/29/22 1615  Labs/Imaging Results for  orders placed or performed during the hospital encounter of 01/29/22 (from the past 48 hour(s))  Resp Panel by RT-PCR (Flu A&B, Covid) Anterior Nasal Swab     Status: None   Collection Time: 01/29/22 10:55 AM   Specimen: Anterior Nasal Swab  Result Value Ref Range   SARS Coronavirus 2 by RT PCR NEGATIVE NEGATIVE    Comment: (NOTE) SARS-CoV-2 target nucleic acids are NOT DETECTED.  The SARS-CoV-2 RNA is generally detectable in upper respiratory specimens during the acute phase of infection. The lowest concentration of SARS-CoV-2 viral copies this assay can detect is 138 copies/mL. A negative result does not preclude SARS-Cov-2 infection and should not be used as the sole basis for treatment or other patient management decisions. A negative  result may occur with  improper specimen collection/handling, submission of specimen other than nasopharyngeal swab, presence of viral mutation(s) within the areas targeted by this assay, and inadequate number of viral copies(<138 copies/mL). A negative result must be combined with clinical observations, patient history, and epidemiological information. The expected result is Negative.  Fact Sheet for Patients:  EntrepreneurPulse.com.au  Fact Sheet for Healthcare Providers:  IncredibleEmployment.be  This test is no t yet approved or cleared by the [place] FDA and  has been authorized for detection and/or diagnosis of SARS-CoV-2 by FDA under an Emergency Use Authorization (EUA). This EUA will remain  in effect (meaning this test can be used) for the duration of the COVID-19 declaration under Section 564(b)(1) of the Act, 21 U.S.C.section 360bbb-3(b)(1), unless the authorization is terminated  or revoked sooner.       Influenza A by PCR NEGATIVE NEGATIVE   Influenza B by PCR NEGATIVE NEGATIVE    Comment: (NOTE) The Xpert Xpress SARS-CoV-2/FLU/RSV plus assay is intended as an aid in the diagnosis of influenza from Nasopharyngeal swab specimens and should not be used as a sole basis for treatment. Nasal washings and aspirates are unacceptable for Xpert Xpress SARS-CoV-2/FLU/RSV testing.  Fact Sheet for Patients: EntrepreneurPulse.com.au  Fact Sheet for Healthcare Providers: IncredibleEmployment.be  This test is not yet approved or cleared by the [place] FDA and has been authorized for detection and/or diagnosis of SARS-CoV-2 by FDA under an Emergency Use Authorization (EUA). This EUA will remain in effect (meaning this test can be used) for the duration of the COVID-19 declaration under Section 564(b)(1) of the Act, 21 U.S.C. section 360bbb-3(b)(1), unless the authorization is terminated  or revoked.  Performed at [facility], [place] [address] [place], [place] [address]   Type and screen [place]     Status: None   Collection Time: 01/29/22 11:22 AM  Result Value Ref Range   ABO/RH(D) O POS    Antibody Screen NEG    Sample Expiration      02/01/2022,2359 Performed at [facility], [place] [address], [place], [place] [address]   Basic metabolic panel     Status: Abnormal   Collection Time: 01/29/22 11:28 AM  Result Value Ref Range   Sodium 140 135 - 145 mmol/L   Potassium 4.6 3.5 - 5.1 mmol/L   Chloride 98 98 - 111 mmol/L   CO2 27 22 - 32 mmol/L   Glucose, Bld 109 (H) 70 - 99 mg/dL    Comment: Glucose reference range applies only to samples taken after fasting for at least 8 hours.   BUN 41 (H) 8 - 23 mg/dL   Creatinine, Ser 7.97 (H) 0.44 - 1.00 mg/dL   Calcium  10.0 8.9 - 10.3 mg/dL   GFR, Estimated 5 (L) >60 mL/min    Comment: (NOTE) Calculated using the CKD-EPI Creatinine Equation (2021)    Anion gap 15 5 - 15    Comment: Performed at [facility], [place] [address], [place], [place] [address]  Troponin I (High Sensitivity)     Status: Abnormal   Collection Time: 01/29/22 11:28 AM  Result Value Ref Range   Troponin I (High Sensitivity) 144 (HH) <18 ng/L    Comment: CRITICAL RESULT CALLED TO, READ BACK BY AND VERIFIED WITH B.BENTON RN 1248 01/29/22 MCCORMICK K (NOTE) Elevated high sensitivity troponin I (hsTnI) values and significant  changes across serial measurements may suggest ACS but many other  chronic and acute conditions are known to elevate hsTnI results.  Refer to the "Links" section for chest pain algorithms and additional  guidance. Performed at [facility], [place] [address], [place], [place] [address]   Magnesium     Status: Abnormal   Collection Time: 01/29/22 11:28 AM  Result Value Ref Range   Magnesium 2.5 (H) 1.7 - 2.4 mg/dL    Comment: Performed at [facility] [address],  [place], [place] [address]  Brain natriuretic peptide (order if patient c/o SOB ONLY)     Status: Abnormal   Collection Time: 01/29/22 11:28 AM  Result Value Ref Range   B Natriuretic Peptide >4,500.0 (H) 0.0 - 100.0 pg/mL    Comment: Performed at [facility] [address], [place], [place] [address]  CBC with Differential/Platelet     Status: Abnormal   Collection Time: 01/29/22 11:28 AM  Result Value Ref Range   WBC 7.2 4.0 - 10.5 K/uL   RBC 3.31 (L) 3.87 - 5.11 MIL/uL   Hemoglobin 10.3 (L) 12.0 - 15.0 g/dL   HCT 32.1 (L) 36.0 - 46.0 %   MCV 97.0 80.0 - 100.0 fL   MCH 31.1 26.0 - 34.0 pg   MCHC 32.1 30.0 - 36.0 g/dL   RDW 18.5 (H) 11.5 - 15.5 %   Platelets 281 150 - 400 K/uL   nRBC 0.0 0.0 - 0.2 %   Neutrophils Relative % 77 %   Neutro Abs 5.7 1.7 - 7.7 K/uL   Lymphocytes Relative 10 %   Lymphs Abs 0.7 0.7 - 4.0 K/uL   Monocytes Relative 10 %   Monocytes Absolute 0.7 0.1 - 1.0 K/uL   Eosinophils Relative 1 %   Eosinophils Absolute 0.1 0.0 - 0.5 K/uL   Basophils Relative 1 %   Basophils Absolute 0.0 0.0 - 0.1 K/uL   Immature Granulocytes 1 %   Abs Immature Granulocytes 0.04 0.00 - 0.07 K/uL    Comment: Performed at [facility] [address] [place], [place] [address]  APTT     Status: None   Collection Time: 01/29/22 11:28 AM  Result Value Ref Range   aPTT 33 24 - 36 seconds    Comment: Performed at [facility] [address], [place], [place] [address]  Protime-INR     Status: None   Collection Time: 01/29/22 11:28 AM  Result Value Ref Range   Prothrombin Time 13.8 11.4 - 15.2 seconds   INR 1.1 0.8 - 1.2    Comment: (NOTE) INR goal varies based on device and disease states. Performed at [facility], [place] [address], [place], [place] [address]   I-stat chem 8, ED  Status: Abnormal   Collection Time: 01/29/22 11:31 AM  Result Value Ref Range   Sodium 137 135 - 145 mmol/L   Potassium 4.8 3.5 - 5.1 mmol/L   Chloride 99 98 - 111 mmol/L    BUN 49 (H) 8 - 23 mg/dL   Creatinine, Ser 8.80 (H) 0.44 - 1.00 mg/dL   Glucose, Bld 113 (H) 70 - 99 mg/dL    Comment: Glucose reference range applies only to samples taken after fasting for at least 8 hours.   Calcium, Ion 1.08 (L) 1.15 - 1.40 mmol/L   TCO2 28 22 - 32 mmol/L   Hemoglobin 11.6 (L) 12.0 - 15.0 g/dL   HCT 34.0 (L) 36.0 - 46.0 %  I-Stat arterial blood gas, ED     Status: Abnormal   Collection Time: 01/29/22 11:32 AM  Result Value Ref Range   pH, Arterial 7.508 (H) 7.35 - 7.45   pCO2 arterial 38.0 32 - 48 mmHg   pO2, Arterial 61 (L) 83 - 108 mmHg   Bicarbonate 30.2 (H) 20.0 - 28.0 mmol/L   TCO2 31 22 - 32 mmol/L   O2 Saturation 93 %   Acid-Base Excess 7.0 (H) 0.0 - 2.0 mmol/L   Sodium 136 135 - 145 mmol/L   Potassium 4.8 3.5 - 5.1 mmol/L   Calcium, Ion 1.08 (L) 1.15 - 1.40 mmol/L   HCT 34.0 (L) 36.0 - 46.0 %   Hemoglobin 11.6 (L) 12.0 - 15.0 g/dL   Sample type [employer]   Troponin I (High Sensitivity)     Status: Abnormal   Collection Time: 01/29/22  1:47 PM  Result Value Ref Range   Troponin I (High Sensitivity) 141 (HH) <18 ng/L    Comment: CRITICAL VALUE NOTED. VALUE IS CONSISTENT WITH PREVIOUSLY REPORTED/CALLED VALUE (NOTE) Elevated high sensitivity troponin I (hsTnI) values and significant  changes across serial measurements may suggest ACS but many other  chronic and acute conditions are known to elevate hsTnI results.  Refer to the "Links" section for chest pain algorithms and additional  guidance. Performed at [facility], [place] [address], [place], [place] [address]    CT Angio Chest/Abd/Pel for Dissection W and/or Wo Contrast  Result Date: 01/29/2022 CLINICAL DATA:  Chest and abdominal pain. Shortness of breath. History of thoracic aortic aneurysm repair. EXAM: CT ANGIOGRAPHY CHEST, ABDOMEN AND PELVIS TECHNIQUE: Non-contrast CT of the chest was initially obtained. Multidetector CT imaging through the chest, abdomen and pelvis was performed using  the standard protocol during bolus administration of intravenous contrast. Multiplanar reconstructed images and MIPs were obtained and reviewed to evaluate the vascular anatomy. RADIATION DOSE REDUCTION: This exam was performed according to the departmental dose-optimization program which includes automated exposure control, adjustment of the mA and/or kV according to patient size and/or use of iterative reconstruction technique. CONTRAST:  150m OMNIPAQUE IOHEXOL 350 MG/ML SOLN COMPARISON:  12/07/2021.  06/05/2021. FINDINGS: CTA CHEST FINDINGS Cardiovascular: Sequelae of ascending thoracic aortic repair evident. Pre contrast imaging shows no hyperdense crescent in the wall of the thoracic aorta suggest the presence of an acute intramural hematoma. Pledgets in the region of the proximal ascending aorta compatible with the reported surgical history. Imaging after IV contrast administration reveals a small luminal outpouching in the ascending aorta just proximal to the origin of the right brachiocephalic artery (axial 493/8and coronal 48/9). This is unchanged over the prior 2 studies dating back to 06/05/2021 and is likely postsurgical in etiology. No evidence for dissection flap within the  thoracic aorta. Arch vessel anatomy from the transverse aorta is widely patent. Right-sided central line tip is positioned in the right atrium. Pulmonary arteries are well opacified without evidence for pulmonary embolus. Mild atherosclerotic calcification is noted in the wall of the thoracic aorta. Heart is enlarged. No pericardial effusion. Reflux of contrast material into the hepatic venous anatomy suggests right heart dysfunction. Mediastinum/Nodes: No mediastinal lymphadenopathy. There is no hilar lymphadenopathy. The esophagus has normal imaging features. There is no axillary lymphadenopathy. Lungs/Pleura: Small to moderate right pleural effusion is similar to minimally progressive in the interval. Patchy ground-glass opacity  is noted in both lungs, mildly progressive in the interval. Similar appearance of collapse/consolidative opacity in the anterior left lower lobe. Musculoskeletal: No worrisome lytic or sclerotic osseous abnormality. Review of the MIP images confirms the above findings. CTA ABDOMEN AND PELVIS FINDINGS VASCULAR Aorta: Normal caliber aorta without aneurysm, dissection, vasculitis or significant stenosis. Atherosclerotic calcification evident. Celiac: Patent without evidence of aneurysm, dissection, vasculitis or significant stenosis. SMA: Patent without evidence of aneurysm, dissection, vasculitis or significant stenosis. Renals: Both renal arteries are patent without evidence of aneurysm, dissection, vasculitis, fibromuscular dysplasia or significant stenosis. IMA: Poorly opacified and patency cannot be confirmed. Inflow: Patent without evidence of aneurysm, dissection, vasculitis or significant stenosis. Veins: No obvious venous abnormality within the limitations of this arterial phase study. Review of the MIP images confirms the above findings. NON-VASCULAR Hepatobiliary: No suspicious focal arterial phase hyperenhancement. The vague hypodensity seen in the anterior liver on the previous exam is not evident today, potentially related to bolus timing. Gallbladder not well demonstrated. No intrahepatic or extrahepatic biliary dilation. Pancreas: No focal mass lesion. No dilatation of the main duct. No intraparenchymal cyst. No peripancreatic edema. Spleen: No splenomegaly. No focal mass lesion. Adrenals/Urinary Tract: No adrenal nodule or mass. 7 mm hyperattenuating or hyperenhancing subcapsular lesion noted upper pole left kidney on 149/6. This was not discernible on the previous exam. Right kidney unremarkable. No evidence for hydroureter. Bladder is nondistended. Stomach/Bowel: Stomach is nondistended. Duodenum is normally positioned as is the ligament of Treitz. No small bowel wall thickening. No small bowel  dilatation. No gross colonic mass. No colonic wall thickening. Lymphatic: No abdominal lymphadenopathy. No pelvic sidewall lymphadenopathy. Reproductive: Calcified fibroids noted in the uterus. There is no adnexal mass. Other: Moderate volume free fluid seen in the pelvis, similar to prior. Musculoskeletal: 2 fascial defects are seen in the left flank region, 1 of which is between the eleventh and twelfth ribs and the second is more posterior and below the left twelfth rib. Both hernias contain only fat and appearance is similar to prior. Lumbar fusion hardware evident. No worrisome lytic or sclerotic osseous abnormality. Review of the MIP images confirms the above findings. IMPRESSION: 1. No evidence for acute intramural hematoma or dissection in the thoracic or abdominal aorta. 2. Status post ascending thoracic aortic repair with stable appearance since 06/05/2021 of small luminal outpouching in the ascending aorta adjacent to the origin of the right brachiocephalic artery and most likely postsurgical in etiology. 3. Small to moderate right pleural effusion is similar to minimally progressive in the interval. 4. Patchy ground-glass opacity in both lungs, mildly progressive in the interval. Imaging features may be related to pulmonary edema although atypical infection/inflammation could have this appearance. 5. 7 mm hyperattenuating or hyperenhancing subcapsular lesion upper pole left kidney, not discernible on the previous study. This may be a cyst complicated by proteinaceous debris or hemorrhage, but neoplasm is not excluded. Follow-up nonemergent outpatient  MRI suggested to further evaluate. 6. Moderate volume free fluid in the pelvis, similar to prior. 7. Aortic Atherosclerosis (ICD10-I70.0). Electronically Signed   By: Misty Stanley M.D.   On: 01/29/2022 13:22   DG Chest Portable 1 View  Result Date: 01/29/2022 CLINICAL DATA:  Chest pain and shortness of breath EXAM: PORTABLE CHEST 1 VIEW COMPARISON:   Chest radiograph 01/06/2022 FINDINGS: The right-sided dialysis catheter is stable terminating in the right atrium. Median sternotomy wires are unchanged. The heart is enlarged, unchanged. The upper mediastinal contours are stable. There are suspected trace bilateral pleural effusions with mild adjacent atelectasis. There is no other focal consolidation. There is no overt pulmonary edema. There is no pneumothorax The bones are stable. IMPRESSION: Cardiomegaly with suspected trace bilateral pleural effusions and mild bibasilar atelectasis. Electronically Signed   By: Valetta Mole M.D.   On: 01/29/2022 11:21    Pending Labs Unresulted Labs (From admission, onward)     Start     Ordered   01/30/22 9242  Basic metabolic panel  Tomorrow morning,   R        01/29/22 1534   01/30/22 0500  CBC  Tomorrow morning,   R        01/29/22 1534   01/29/22 1413  Hepatitis B surface antigen  (New Admission [facility] (Hepatitis B))  Once,   R        01/29/22 1413   01/29/22 1413  Hepatitis B surface antibody  (New Admission [facility] (Hepatitis B))  Once,   R        01/29/22 1413   01/29/22 1413  Hepatitis B surface antibody,quantitative  (New Admission [facility] (Hepatitis B))  Once,   R        01/29/22 1413   01/29/22 1413  Hepatitis B core antibody, total  (New Admission [facility] (Hepatitis B))  Once,   R        01/29/22 1413   01/29/22 1413  Hepatitis C antibody  (New Admission [facility] (Hepatitis B))  Once,   R        01/29/22 1413            Vitals/Pain Today's Vitals   01/29/22 1345 01/29/22 1350 01/29/22 1457 01/29/22 1500  BP: (!) 171/111 (!) 173/114  (!) 181/115  Pulse: 82 85  81  Resp: (!) '23 19  20  '$ Temp:   98.4 F (36.9 C)   TempSrc:   Oral   SpO2: 97% 97%  100%  Weight:      Height:      PainSc:        Isolation Precautions No active isolations  Medications Medications  Chlorhexidine Gluconate Cloth 2 % PADS 6 each (has no administration in time range)  amLODipine  (NORVASC) tablet 10 mg (has no administration in time range)  carvedilol (COREG) tablet 6.25 mg (has no administration in time range)  cloNIDine (CATAPRES - Dosed in mg/24 hr) patch 0.2 mg (has no administration in time range)  hydrALAZINE (APRESOLINE) tablet 50 mg (has no administration in time range)  isosorbide mononitrate (IMDUR) 24 hr tablet 30 mg (has no administration in time range)  losartan (COZAAR) tablet 100 mg (has no administration in time range)  rosuvastatin (CRESTOR) tablet 10 mg (has no administration in time range)  torsemide (DEMADEX) tablet 40 mg (has no administration in time range)  mirtazapine (REMERON) tablet 7.5 mg (has no administration in time range)  sevelamer carbonate (RENVELA) powder PACK 2.4 g (  has no administration in time range)  apixaban (ELIQUIS) tablet 2.5 mg (has no administration in time range)  albuterol (PROVENTIL) (2.5 MG/3ML) 0.083% nebulizer solution 2.5 mg (has no administration in time range)  zolpidem (AMBIEN) tablet 5 mg (has no administration in time range)  sorbitol 70 % solution 30 mL (has no administration in time range)  docusate sodium (ENEMEEZ) enema 283 mg (has no administration in time range)  camphor-menthol (SARNA) lotion 1 Application (has no administration in time range)    And  hydrOXYzine (ATARAX) tablet 25 mg (has no administration in time range)  calcium carbonate (dosed in mg elemental calcium) suspension 500 mg of elemental calcium (has no administration in time range)  feeding supplement (NEPRO CARB STEADY) liquid 237 mL (has no administration in time range)  sodium chloride flush (NS) 0.9 % injection 3 mL (has no administration in time range)  hydrALAZINE (APRESOLINE) injection 5 mg (has no administration in time range)  HYDROmorphone (DILAUDID) injection 0.5 mg (0.5 mg Intravenous Given 01/29/22 1102)  esmolol (BREVIBLOC) bolus via infusion 22,700 mcg (22,700 mcg Intravenous Bolus from Bag 01/29/22 1217)  ondansetron (ZOFRAN)  injection 4 mg (4 mg Intravenous Given 01/29/22 1223)  iohexol (OMNIPAQUE) 350 MG/ML injection 100 mL (100 mLs Intravenous Contrast Given 01/29/22 1258)  cefTRIAXone (ROCEPHIN) 1 g in sodium chloride 0.9 % 100 mL IVPB (0 g Intravenous Stopped 01/29/22 1538)  doxycycline (VIBRA-TABS) tablet 100 mg (100 mg Oral Given 01/29/22 1427)  metoprolol tartrate (LOPRESSOR) injection 5 mg (5 mg Intravenous Given 01/29/22 1425)    Mobility Walks with assistants per pt, have not been able to get pt up  High fall risk   Focused Assessments    R Recommendations: See Admitting Provider Note  Report given to:   Additional Notes:

## 2022-01-29 NOTE — ED Triage Notes (Signed)
Patient presents to ed via [facility] states she is sob and c/o chest pain and abd. Pain, states she is on dialysis T-TH-S, was last at dialysis on tues, states she was to weak to go today.

## 2022-01-29 NOTE — H&P (Signed)
History and Physical    Patient: Monique Henderson Monique Henderson DOB: [birth year] DOA: 01/29/2022 DOS: the patient was seen and examined on 01/29/2022 PCP: Pcp, No  Patient coming from: Home - lives with boyfriend; NOK: Monique Henderson, [phone]   Chief Complaint: CP/SOB  HPI: Monique Henderson is a 71 y.o. female with medical history significant of ESRD on TTS HD; HTN; afib on Eliquis; chronic systolic CHF; polysubstance abuse; and AAA with dissection s/p repair presenting with CP/SOB.  She was previously admitted from 7/26-8/1 for AMS from HTN emergency and again from 8/8-12 with HTN urgency.  She reports that she awoke about 0200 with weakness and SOB.  She felt n/v/d.  That resolved and then she was hungry but still too weak to eat.  She was breathing hard and her chest was hurting.  Her BP has been high and she didn't take her medication today, too weak.  She has not missed HD.  She also didn't take her medications yesterday.      ER Course:  Here with afib with RVR, CP.  HR 130-150s.  Concern for dissection, has prior.  CTA with ?atypical PNA, treated but less likely.  Started on Esmolol drip, will try to wean off and will give metoprolol.  Troponin 144, cardiology thinks rate-related and will consult.  Nephrology will arrange for HD.     Review of Systems: As mentioned in the history of present illness. All other systems reviewed and are negative. Past Medical History:  Diagnosis Date   AF (paroxysmal atrial fibrillation) ([place]) 05/29/2019   on Coumadin   Aortic atherosclerosis ([place]) 07/05/2019   Aortic dissection ([place]) 04/04/2019   s/p repair   Bone spur 2008   Right calcaneal foot spur   Breast cancer ([place]) 2004   Ductal carcinoma in situ of the left breast; S/P left partial mastectomy 02/26/2003; S/P re-excision of cranial and lateral margins11/18/2004.radiation   Cerebral thrombosis with cerebral infarction 05/22/2019   Chronic low back pain 06/22/2016   Chronic  obstructive lung disease ([place]) 01/16/2017   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 Autumn Messing; S/P re-excision of superior and inferior margins 10/27/2012.    ESRD on hemodialysis ([place]) 05/29/2019   Essential hypertension 09/16/2006   GERD 09/16/2006   Hepatitis C    treated and RNA confirmed not detectable 01/2017   Insomnia 03/14/2015   Malnutrition of moderate degree 05/19/2019   Non compliance with medical treatment 12/04/2017   Normocytic anemia    With thrombocytosis   Osteoarthritis    Right ureteral stone 2002   S/P lumbar spinal fusion 01/18/2014   S/P lumbar decompressive laminectomy, fusion, and plating for lumbar spinal stenosis on 05/27/2009 by Dr. Eustace Moore.  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 Eustace Moore.     Tobacco use disorder 04/19/2009   Uterine fibroid    Wears dentures    top   Past Surgical History:  Procedure Laterality Date   ANTERIOR LAT LUMBAR FUSION N/A 01/18/2014   Procedure: ANTERIOR LATERAL LUMBAR FUSION LUMBAR TWO-THREE;  Surgeon: Eustace Moore, MD;  Location: [facility];  Service: Neurosurgery;  Laterality: N/A;   ANTERIOR LUMBAR FUSION  01/18/2014   AV FISTULA PLACEMENT Left 04/20/2019   Procedure: ARTERIOVENOUS (AV) FISTULA CREATION;  Surgeon: [name], MD;  Location: [facility];  Service: Vascular;  Laterality: Left;   BACK SURGERY     BREAST LUMPECTOMY Left 01/2003   BREAST LUMPECTOMY Right 2014   BREAST LUMPECTOMY WITH NEEDLE LOCALIZATION AND AXILLARY SENTINEL LYMPH NODE BX Right 10/13/2012   Procedure: BREAST LUMPECTOMY WITH NEEDLE LOCALIZATION;  Surgeon: [name], MD;  Location: [facility];  Service: General;  Laterality: Right;  Right breast wire  localized lumpectomy   INSERTION OF DIALYSIS CATHETER Right 04/20/2019   Procedure: INSERTION OF DIALYSIS CATHETER, right internal jugular;  Surgeon: [name], MD;  Location: [facility];  Service: Vascular;  Laterality: Right;   INSERTION OF DIALYSIS CATHETER Right 09/24/2020   Procedure: INSERTION OF TUNNELED DIALYSIS CATHETER;  Surgeon: Rosetta Posner, MD;  Location: [facility];  Service: Vascular;  Laterality: Right;   IR FLUORO GUIDE CV LINE RIGHT  09/22/2020   IR THORACENTESIS ASP PLEURAL SPACE W/IMG GUIDE  05/19/2019   IR US GUIDE VASC ACCESS LEFT  09/22/2020   IR US GUIDE VASC ACCESS RIGHT  09/22/2020   IR VENOCAVAGRAM SVC  09/22/2020   LAMINECTOMY  05/27/2009   Lumbar decompressive laminectomy, fusion and plating for lumbar spinal stensosis   LIGATION OF ARTERIOVENOUS  FISTULA Left 09/24/2020   Procedure: LIGATION OF LEFT ARM ARTERIOVENOUS  FISTULA;  Surgeon: Rosetta Posner, MD;  Location: [facility];  Service: Vascular;  Laterality: Left;   LUMBAR LAMINECTOMY/DECOMPRESSION MICRODISCECTOMY Left 03/23/2013   Procedure: LUMBAR LAMINECTOMY/DECOMPRESSION MICRODISCECTOMY 1 LEVEL;  Surgeon: Eustace Moore, MD;  Location: [facility];  Service: Neurosurgery;  Laterality: Left;  LUMBAR LAMINECTOMY/DECOMPRESSION MICRODISCECTOMY 1 LEVEL   MASTECTOMY, PARTIAL Left 02/26/2003   ; S/P re-excision of cranial and lateral margins 04/19/2003.    RE-EXCISION OF BREAST CANCER,SUPERIOR MARGINS Right 10/27/2012   Procedure: RE-EXCISION OF BREAST CANCER,SUPERIOR and inferior MARGINS;  Surgeon: [name], MD;  Location: [place];  Service: General;  Laterality: Right;   RE-EXCISION OF BREAST LUMPECTOMY Left 04/2003   TEE WITHOUT CARDIOVERSION N/A 04/04/2019   Procedure: Transesophageal Echocardiogram (Tee);  Surgeon: Wonda Olds, MD;  Location: [facility];  Service: Open Heart Surgery;  Laterality: N/A;   THORACIC AORTIC ANEURYSM REPAIR N/A 04/04/2019   Procedure: THORACIC ASCENDING ANEURYSM REPAIR (AAA)  USING  28 MM X 30 CM HEMASHIELD PLATINUM VASCULAR GRAFT;  Surgeon: Wonda Olds, MD;  Location: [facility];  Service: Open Heart Surgery;  Laterality: N/A;   Social History:  reports that she has been smoking cigarettes. She has a 11.00 pack-year smoking history. She has never used smokeless tobacco. She reports that she does not currently use alcohol after a past usage of about 2.0 standard drinks of alcohol per week. She reports that she does not currently use drugs after having used the following drugs: Cocaine.  Allergies  Allergen Reactions   Shrimp [Shellfish Allergy] Shortness Of Breath   Bactroban [Mupirocin] Other (See Comments)    "Sores in nose"   Tylenol [Acetaminophen] Itching   Zestril [Lisinopril] Cough    Family History  Problem Relation Age of Onset   Colon cancer Mother 73   Hypertension Mother    Diabetes Sister 5   Hypertension Sister    Diabetes Brother    Hypertension Brother    Diabetes Brother    Hypertension Brother    Kidney disease Son        s/p renal transplant   Hypertension Son    Diabetes Son  Multiple sclerosis Son    Bone cancer Sister 74   Breast cancer Neg Hx    Cervical cancer Neg Hx     Prior to Admission medications   Medication Sig Start Date End Date Taking? Authorizing Provider  albuterol (VENTOLIN HFA) 108 (90 Base) MCG/ACT inhaler Inhale 2 puffs into the lungs every 6 (six) hours as needed for wheezing or shortness of breath. 07/30/20  Yes Maximiano Coss, NP  amLODipine (NORVASC) 10 MG tablet Take 1 tablet (10 mg total) by mouth daily. 01/10/22  Yes Charlynne Cousins, MD  apixaban (ELIQUIS) 2.5 MG TABS tablet Take 1 tablet (2.5 mg total) by mouth 2 (two) times daily. 08/24/21  Yes Paige, Weldon Picking, DO  carvedilol (COREG) 6.25 MG tablet Take 1 tablet (6.25 mg total) by mouth 2 (two) times daily with a meal. 01/10/22  Yes Charlynne Cousins, MD  Cholecalciferol (VITAMIN D-3 PO) Take 1 capsule by mouth daily.   Yes [provider]   cloNIDine (CATAPRES - DOSED IN MG/24 HR) 0.2 mg/24hr patch Place 1 patch (0.2 mg total) onto the skin every Saturday. 01/10/22  Yes Charlynne Cousins, MD  hydrALAZINE (APRESOLINE) 50 MG tablet Take 1 tablet (50 mg total) by mouth 3 (three) times daily. 01/10/22 02/09/22 Yes Charlynne Cousins, MD  isosorbide mononitrate (IMDUR) 30 MG 24 hr tablet Take 2 tablets (60 mg total) by mouth daily. Patient taking differently: Take 30 mg by mouth daily. 01/10/22  Yes Charlynne Cousins, MD  losartan (COZAAR) 100 MG tablet Take 1 tablet (100 mg total) by mouth daily. 02/14/21  Yes Maximiano Coss, NP  mirtazapine (REMERON) 7.5 MG tablet Take 1 tablet (7.5 mg total) by mouth at bedtime. Patient taking differently: Take 7.5 mg by mouth at bedtime as needed (sleep). 07/15/20  Yes Maximiano Coss, NP  rosuvastatin (CRESTOR) 10 MG tablet Take 10 mg by mouth daily.   Yes [provider]  sevelamer carbonate (RENVELA) 2.4 g PACK 2.4 g with breakfast, with lunch, and with evening meal.   Yes [provider]  torsemide 40 MG TABS Take 40 mg by mouth 2 (two) times daily. 01/10/22  Yes Charlynne Cousins, MD    Physical Exam: Vitals:   01/29/22 1457 01/29/22 1500 01/29/22 1725 01/29/22 1730  BP:  (!) 181/115 (!) 175/118 (!) 161/100  Pulse:  81 86 86  Resp:  20 (!) 28 (!) 38  Temp: 98.4 F (36.9 C)     TempSrc: Oral     SpO2:  100% 96% 97%  Weight:      Height:       General:  Appears calm and comfortable and is in NAD Eyes:  PERRL, EOMI, normal lids, iris ENT:  grossly normal hearing, lips & tongue, mmm Neck:  no LAD, masses or thyromegaly Cardiovascular:  RRR, +S3. Tr LE edema.  Respiratory:   Mild scattered rhonchi.  Mildly increased respiratory effort. Abdomen:  soft, NT, ND Skin:  no rash or induration seen on limited exam Musculoskeletal:  grossly normal tone BUE/BLE, good ROM, no bony abnormality Psychiatric:  blunted mood and affect, speech fluent and appropriate,  AOx3 Neurologic:  CN 2-12 grossly intact, moves all extremities in coordinated fashion   Radiological Exams on Admission: Independently reviewed - see discussion in A/P where applicable  CT Angio Chest/Abd/Pel for Dissection W and/or Wo Contrast  Result Date: 01/29/2022 CLINICAL DATA:  Chest and abdominal pain. Shortness of breath. History of thoracic aortic aneurysm repair. EXAM: CT ANGIOGRAPHY CHEST,  ABDOMEN AND PELVIS TECHNIQUE: Non-contrast CT of the chest was initially obtained. Multidetector CT imaging through the chest, abdomen and pelvis was performed using the standard protocol during bolus administration of intravenous contrast. Multiplanar reconstructed images and MIPs were obtained and reviewed to evaluate the vascular anatomy. RADIATION DOSE REDUCTION: This exam was performed according to the departmental dose-optimization program which includes automated exposure control, adjustment of the mA and/or kV according to patient size and/or use of iterative reconstruction technique. CONTRAST:  131m OMNIPAQUE IOHEXOL 350 MG/ML SOLN COMPARISON:  12/07/2021.  06/05/2021. FINDINGS: CTA CHEST FINDINGS Cardiovascular: Sequelae of ascending thoracic aortic repair evident. Pre contrast imaging shows no hyperdense crescent in the wall of the thoracic aorta suggest the presence of an acute intramural hematoma. [place] in the region of the proximal ascending aorta compatible with the reported surgical history. Imaging after IV contrast administration reveals a small luminal outpouching in the ascending aorta just proximal to the origin of the right brachiocephalic artery (axial 409/6and coronal 48/9). This is unchanged over the prior 2 studies dating back to 06/05/2021 and is likely postsurgical in etiology. No evidence for dissection flap within the thoracic aorta. Arch vessel anatomy from the transverse aorta is widely patent. Right-sided central line tip is positioned in the right atrium. Pulmonary  arteries are well opacified without evidence for pulmonary embolus. Mild atherosclerotic calcification is noted in the wall of the thoracic aorta. Heart is enlarged. No pericardial effusion. Reflux of contrast material into the hepatic venous anatomy suggests right heart dysfunction. Mediastinum/Nodes: No mediastinal lymphadenopathy. There is no hilar lymphadenopathy. The esophagus has normal imaging features. There is no axillary lymphadenopathy. Lungs/Pleura: Small to moderate right pleural effusion is similar to minimally progressive in the interval. Patchy ground-glass opacity is noted in both lungs, mildly progressive in the interval. Similar appearance of collapse/consolidative opacity in the anterior left lower lobe. Musculoskeletal: No worrisome lytic or sclerotic osseous abnormality. Review of the MIP images confirms the above findings. CTA ABDOMEN AND PELVIS FINDINGS VASCULAR Aorta: Normal caliber aorta without aneurysm, dissection, vasculitis or significant stenosis. Atherosclerotic calcification evident. Celiac: Patent without evidence of aneurysm, dissection, vasculitis or significant stenosis. SMA: Patent without evidence of aneurysm, dissection, vasculitis or significant stenosis. Renals: Both renal arteries are patent without evidence of aneurysm, dissection, vasculitis, fibromuscular dysplasia or significant stenosis. IMA: Poorly opacified and patency cannot be confirmed. Inflow: Patent without evidence of aneurysm, dissection, vasculitis or significant stenosis. Veins: No obvious venous abnormality within the limitations of this arterial phase study. Review of the MIP images confirms the above findings. NON-VASCULAR Hepatobiliary: No suspicious focal arterial phase hyperenhancement. The vague hypodensity seen in the anterior liver on the previous exam is not evident today, potentially related to bolus timing. Gallbladder not well demonstrated. No intrahepatic or extrahepatic biliary dilation.  Pancreas: No focal mass lesion. No dilatation of the main duct. No intraparenchymal cyst. No peripancreatic edema. Spleen: No splenomegaly. No focal mass lesion. Adrenals/Urinary Tract: No adrenal nodule or mass. 7 mm hyperattenuating or hyperenhancing subcapsular lesion noted upper pole left kidney on 149/6. This was not discernible on the previous exam. Right kidney unremarkable. No evidence for hydroureter. Bladder is nondistended. Stomach/Bowel: Stomach is nondistended. Duodenum is normally positioned as is the ligament of Treitz. No small bowel wall thickening. No small bowel dilatation. No gross colonic mass. No colonic wall thickening. Lymphatic: No abdominal lymphadenopathy. No pelvic sidewall lymphadenopathy. Reproductive: Calcified fibroids noted in the uterus. There is no adnexal mass. Other: Moderate volume free fluid seen in the pelvis,  similar to prior. Musculoskeletal: 2 fascial defects are seen in the left flank region, 1 of which is between the eleventh and twelfth ribs and the second is more posterior and below the left twelfth rib. Both hernias contain only fat and appearance is similar to prior. Lumbar fusion hardware evident. No worrisome lytic or sclerotic osseous abnormality. Review of the MIP images confirms the above findings. IMPRESSION: 1. No evidence for acute intramural hematoma or dissection in the thoracic or abdominal aorta. 2. Status post ascending thoracic aortic repair with stable appearance since 06/05/2021 of small luminal outpouching in the ascending aorta adjacent to the origin of the right brachiocephalic artery and most likely postsurgical in etiology. 3. Small to moderate right pleural effusion is similar to minimally progressive in the interval. 4. Patchy ground-glass opacity in both lungs, mildly progressive in the interval. Imaging features may be related to pulmonary edema although atypical infection/inflammation could have this appearance. 5. 7 mm hyperattenuating or  hyperenhancing subcapsular lesion upper pole left kidney, not discernible on the previous study. This may be a cyst complicated by proteinaceous debris or hemorrhage, but neoplasm is not excluded. Follow-up nonemergent outpatient MRI suggested to further evaluate. 6. Moderate volume free fluid in the pelvis, similar to prior. 7. Aortic Atherosclerosis (ICD10-I70.0). Electronically Signed   By: Misty Stanley M.D.   On: 01/29/2022 13:22   DG Chest Portable 1 View  Result Date: 01/29/2022 CLINICAL DATA:  Chest pain and shortness of breath EXAM: PORTABLE CHEST 1 VIEW COMPARISON:  Chest radiograph 01/06/2022 FINDINGS: The right-sided dialysis catheter is stable terminating in the right atrium. Median sternotomy wires are unchanged. The heart is enlarged, unchanged. The upper mediastinal contours are stable. There are suspected trace bilateral pleural effusions with mild adjacent atelectasis. There is no other focal consolidation. There is no overt pulmonary edema. There is no pneumothorax The bones are stable. IMPRESSION: Cardiomegaly with suspected trace bilateral pleural effusions and mild bibasilar atelectasis. Electronically Signed   By: Valetta Mole M.D.   On: 01/29/2022 11:21    EKG: Independently reviewed.   [place] with rate 138; prolonged QTc 502; IVCD; nonspecific ST changes with no evidence of acute ischemia 1224 - [place] with rate 93; prolonged QTc 503; RBBB, IVCD; no evidence of acute ischemia   Labs on Admission: I have personally reviewed the available labs and imaging studies at the time of the admission.  Pertinent labs:    VBG: 7.508/38/30.2 BUN 41/Creatinine 7.97/GFR 5 BNP >4500 HS troponin 144 WBC 7.2 Hgb 10.3 COVID/flu negative   Assessment and Plan: Principal Problem:   Hypervolemia associated with renal insufficiency Active Problems:   Troponin level elevated   Acute on chronic combined systolic and diastolic CHF (congestive heart failure) (HCC)   AF  (paroxysmal atrial fibrillation) (HCC)   ESRD on dialysis ([facility])   Malignant hypertension   Prolonged QT interval   Dyslipidemia   DNR (do not resuscitate)    Volume overload in an ESRD on HD patient -Patient with recurrent admissions for HTN crisis once again here with CP/SOB -She reports compliance with HD but appears to be volume overloaded; this may be related to medication noncompliance (see below) -She also may need a new EDW -She is likely to benefit from serial HD both today and tomorrow and may be appropriate for d/c to home tomorrow after HD -Will observe on progressive care -Patient on chronic TTS HD -Nephrology prn order set utilized -Nephrology is aware that patient will need HD  -Continue Renvela -  Low suspicion for infectious etiology, will not continue antibiotics at this time  Demand ischemia -Troponin is elevated but improved compared to prior -Negative delta -Low current suspicion for ACS -Likely related to volume overload and/or uncontrolled afib (now improved) -Cardiology discussed and agreed but it was the wrong group -Will not plan to consult [facility] at this time due to low suspicion for active disease but will call if concerns arise -Continue Imdur  HTN -Hypertensive crisis is a consideration, as this is a frequent presentation -She did not take her medications yesterday or today and this may have precipitated her symptoms -Continue amlodipine, carvedilol, clonidine, hydralazine, Imdur, losartan, torsemide  HLD -Continue rosuvastatin  Acute on Chronic combined CHF -Her last echo was on 06/19/21 and showed EF 40-45% and grade 3 diastolic dysfunction -Volume controlled with HD -Continue torsemide  AAA -s/p repair -Imaging today indicates no current issues  Afib -Poor rate control on presentation, now controlled -Continue carvedilol for rate control -Continue Eliquis  Prolonged QTc -Will attempt to avoid QT-prolonging medications such as  PPI, nausea meds, SSRIs  Polysubstance abuse -Denies recent use -Last positive on UDS on 3/14 (cocaine) -Will check UDS  DNR -I have discussed code status with the patient and she would prefer to die a natural death should that situation arise. -She will need a gold out of facility DNR form at the time of discharge     Advance Care Planning:   Code Status: DNR   Consults: Nephrology; cardiology by telephone only; TOC team; PT/OT  DVT Prophylaxis: Eliquis  Family Communication: None present; she is capable of communicating with family at this time  Severity of Illness: The appropriate patient status for this patient is OBSERVATION. Observation status is judged to be reasonable and necessary in order to provide the required intensity of service to ensure the patient's safety. The patient's presenting symptoms, physical exam findings, and initial radiographic and laboratory data in the context of their medical condition is felt to place them at decreased risk for further clinical deterioration. Furthermore, it is anticipated that the patient will be medically stable for discharge from the hospital within 2 midnights of admission.   Author: Karmen Bongo, MD 01/29/2022 6:01 PM  For on call review [email or web address].

## 2022-01-29 NOTE — Consult Note (Addendum)
[facility] Renal Consultation Note    Indication for Consultation:  Management of ESRD/hemodialysis, anemia, hypertension/volume, and secondary hyperparathyroidism.  HPI: Monique Henderson is a 71 y.o. female with PMH including ESRD on dialysis, A.fib, HTN, COPD who presented to the ED today with chest pain. She reports she woke up with sharp pain radiating to her abdomen. Also reports cough and congestion. Reported compliance with her medications but was unable to tell the ED which antihypertensives she takes. Tells me she takes "all of them" but did not take any medication this morning. ED course notable for significant HTN with BP 183/145. K+ 4.8, BUN 49, Cr 8.8, Hb 11.6, WBC 7.2. BNP >45000. CT chest notable for patchy infiltrates in both lungs, consistent with pulmonary edema. Pt reports feeling better now. Denies CP, palpitations, dizziness, SOB, headache, abdominal pain, nausea and vomiting. No recent fevers or chills. No peripheral edema. Denies any recent illicit drug use.   Pt has had recurrent hospital admissions for hypertension and volume overload. BP is sometimes at goal at her outpatient dialysis unit. Seems to have some inconsistency in medication adherence. She has been attending all of her dialysis sessions lately but not reaching her EDW.   Past Medical History:  Diagnosis Date   AF (paroxysmal atrial fibrillation) ([place]) 05/29/2019   on Coumadin   Aortic atherosclerosis ([place]) 07/05/2019   Aortic dissection ([place]) 04/04/2019   s/p repair   Bone spur 2008   Right calcaneal foot spur   Breast cancer ([place]) 2004   Ductal carcinoma in situ of the left breast; S/P left partial mastectomy 02/26/2003; S/P re-excision of cranial and lateral margins11/18/2004.radiation   Cerebral thrombosis with cerebral infarction 05/22/2019   Chronic low back pain 06/22/2016   Chronic obstructive lung disease ([place]) 01/16/2017   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 Autumn Messing; S/P re-excision of superior and inferior margins 10/27/2012.    ESRD on hemodialysis ([place]) 05/29/2019   Essential hypertension 09/16/2006   GERD 09/16/2006   Hepatitis C    treated and RNA confirmed not detectable 01/2017   Insomnia 03/14/2015   Malnutrition of moderate degree 05/19/2019   Non compliance with medical treatment 12/04/2017   Normocytic anemia    With thrombocytosis   Osteoarthritis    Right ureteral stone 2002   S/P lumbar spinal fusion 01/18/2014   S/P lumbar decompressive laminectomy, fusion, and plating for lumbar spinal stenosis on 05/27/2009 by Dr. Eustace Moore.  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 Eustace Moore.     Tobacco use disorder 04/19/2009   Uterine fibroid    Wears dentures    top   Past Surgical History:  Procedure Laterality Date   ANTERIOR LAT LUMBAR FUSION N/A 01/18/2014   Procedure: ANTERIOR LATERAL LUMBAR FUSION LUMBAR TWO-THREE;  Surgeon: Eustace Moore, MD;  Location: [place] [facility];  Service: Neurosurgery;  Laterality: N/A;   ANTERIOR LUMBAR FUSION  01/18/2014   AV FISTULA PLACEMENT Left 04/20/2019   Procedure: ARTERIOVENOUS (AV) FISTULA CREATION;  Surgeon: [name], MD;  Location: [place];  Service: Vascular;  Laterality: Left;   BACK SURGERY     BREAST LUMPECTOMY Left 01/2003   BREAST LUMPECTOMY Right 2014   BREAST LUMPECTOMY WITH NEEDLE LOCALIZATION AND AXILLARY SENTINEL LYMPH NODE  BX Right 10/13/2012   Procedure: BREAST LUMPECTOMY WITH NEEDLE LOCALIZATION;  Surgeon: [name], MD;  Location: [place];  Service: General;  Laterality: Right;  Right breast wire localized lumpectomy   INSERTION OF DIALYSIS CATHETER Right 04/20/2019   Procedure: INSERTION OF  DIALYSIS CATHETER, right internal jugular;  Surgeon: [name], MD;  Location: [place];  Service: Vascular;  Laterality: Right;   INSERTION OF DIALYSIS CATHETER Right 09/24/2020   Procedure: INSERTION OF TUNNELED DIALYSIS CATHETER;  Surgeon: Rosetta Posner, MD;  Location: [place];  Service: Vascular;  Laterality: Right;   IR FLUORO GUIDE CV LINE RIGHT  09/22/2020   IR THORACENTESIS ASP PLEURAL SPACE W/IMG GUIDE  05/19/2019   IR US GUIDE VASC ACCESS LEFT  09/22/2020   IR US GUIDE VASC ACCESS RIGHT  09/22/2020   IR VENOCAVAGRAM SVC  09/22/2020   LAMINECTOMY  05/27/2009   Lumbar decompressive laminectomy, fusion and plating for lumbar spinal stensosis   LIGATION OF ARTERIOVENOUS  FISTULA Left 09/24/2020   Procedure: LIGATION OF LEFT ARM ARTERIOVENOUS  FISTULA;  Surgeon: Rosetta Posner, MD;  Location: [place];  Service: Vascular;  Laterality: Left;   LUMBAR LAMINECTOMY/DECOMPRESSION MICRODISCECTOMY Left 03/23/2013   Procedure: LUMBAR LAMINECTOMY/DECOMPRESSION MICRODISCECTOMY 1 LEVEL;  Surgeon: Eustace Moore, MD;  Location: [facility];  Service: Neurosurgery;  Laterality: Left;  LUMBAR LAMINECTOMY/DECOMPRESSION MICRODISCECTOMY 1 LEVEL   MASTECTOMY, PARTIAL Left 02/26/2003   ; S/P re-excision of cranial and lateral margins 04/19/2003.    RE-EXCISION OF BREAST CANCER,SUPERIOR MARGINS Right 10/27/2012   Procedure: RE-EXCISION OF BREAST CANCER,SUPERIOR and inferior MARGINS;  Surgeon: [name], MD;  Location: [place];  Service: General;  Laterality: Right;   RE-EXCISION OF BREAST LUMPECTOMY Left 04/2003   TEE WITHOUT CARDIOVERSION N/A 04/04/2019   Procedure: Transesophageal Echocardiogram ([place]);  Surgeon: Wonda Olds, MD;  Location: [place];  Service: Open Heart Surgery;  Laterality: N/A;   THORACIC AORTIC ANEURYSM REPAIR N/A 04/04/2019   Procedure: THORACIC ASCENDING ANEURYSM REPAIR (AAA)  USING 28 MM X 30 CM HEMASHIELD PLATINUM VASCULAR GRAFT;  Surgeon: Wonda Olds, MD;  Location: [facility];   Service: Open Heart Surgery;  Laterality: N/A;   Family History  Problem Relation Age of Onset   Colon cancer Mother 7   Hypertension Mother    Diabetes Sister 40   Hypertension Sister    Diabetes Brother    Hypertension Brother    Diabetes Brother    Hypertension Brother    Kidney disease Son        s/p renal transplant   Hypertension Son    Diabetes Son    Multiple sclerosis Son    Bone cancer Sister 37   Breast cancer Neg Hx    Cervical cancer Neg Hx    Social History:  reports that she has been smoking cigarettes. She has a 11.00 pack-year smoking history. She has never used smokeless tobacco. She reports that she does not currently use alcohol after a past usage of about 2.0 standard drinks of alcohol per week. She reports that she does not currently use drugs after having used the following drugs: Cocaine.  ROS: As per HPI otherwise negative.  Physical Exam: Vitals:   01/29/22 1320 01/29/22 1325 01/29/22 1335 01/29/22 1340  BP: (!) 166/114 (!) 174/115 (!) 168/117 (!) 174/124  Pulse: 90 86 85 85  Resp: (!) 22 19 (!) 21 (!) 26  Temp:      TempSrc:  SpO2: 99% 95% 96% 98%  Weight:      Height:         General: Thin female, alert and in no acute distress. Head: Normocephalic, atraumatic, sclera non-icteric, mucus membranes are moist. Neck: VD not elevated. Lungs: + rhonchi bilateral lower lobes. Breathing is unlabored on O2 via [place]. Heart: RRR with normal S1, S2. No murmurs, rubs, or gallops appreciated. Abdomen: Soft, non-tender, non-distended with normoactive bowel sounds. No rebound/guarding. No obvious abdominal masses. Lower extremities: No edema or ischemic changes, no open wounds. Neuro: Alert and oriented X 3. Moves all extremities spontaneously. Psych:  Responds to questions appropriately with a normal affect. Dialysis Access: [facility] with intact bandage  Allergies  Allergen Reactions   Shrimp [Shellfish Allergy] Shortness Of Breath   Bactroban  [Mupirocin] Other (See Comments)    "Sores in nose"   Tylenol [Acetaminophen] Itching   Zestril [Lisinopril] Cough   Prior to Admission medications   Medication Sig Start Date End Date Taking? Authorizing Provider  albuterol (VENTOLIN HFA) 108 (90 Base) MCG/ACT inhaler Inhale 2 puffs into the lungs every 6 (six) hours as needed for wheezing or shortness of breath. 07/30/20  Yes Maximiano Coss, NP  amLODipine (NORVASC) 10 MG tablet Take 1 tablet (10 mg total) by mouth daily. 01/10/22  Yes Charlynne Cousins, MD  apixaban (ELIQUIS) 2.5 MG TABS tablet Take 1 tablet (2.5 mg total) by mouth 2 (two) times daily. 08/24/21  Yes Paige, Weldon Picking, DO  carvedilol (COREG) 6.25 MG tablet Take 1 tablet (6.25 mg total) by mouth 2 (two) times daily with a meal. 01/10/22  Yes Charlynne Cousins, MD  Cholecalciferol (VITAMIN D-3 PO) Take 1 capsule by mouth daily.   Yes [provider]  cloNIDine (CATAPRES - DOSED IN MG/24 HR) 0.2 mg/24hr patch Place 1 patch (0.2 mg total) onto the skin every Saturday. 01/10/22  Yes Charlynne Cousins, MD  hydrALAZINE (APRESOLINE) 50 MG tablet Take 1 tablet (50 mg total) by mouth 3 (three) times daily. 01/10/22 02/09/22 Yes Charlynne Cousins, MD  isosorbide mononitrate (IMDUR) 30 MG 24 hr tablet Take 2 tablets (60 mg total) by mouth daily. Patient taking differently: Take 30 mg by mouth daily. 01/10/22  Yes Charlynne Cousins, MD  losartan (COZAAR) 100 MG tablet Take 1 tablet (100 mg total) by mouth daily. 02/14/21  Yes Maximiano Coss, NP  mirtazapine (REMERON) 7.5 MG tablet Take 1 tablet (7.5 mg total) by mouth at bedtime. Patient taking differently: Take 7.5 mg by mouth at bedtime as needed (sleep). 07/15/20  Yes Maximiano Coss, NP  rosuvastatin (CRESTOR) 10 MG tablet Take 10 mg by mouth daily.   Yes [provider]  sevelamer carbonate (RENVELA) 2.4 g PACK 2.4 g with breakfast, with lunch, and with evening meal.   Yes [provider]  torsemide 40  MG TABS Take 40 mg by mouth 2 (two) times daily. 01/10/22  Yes Charlynne Cousins, MD   Current Facility-Administered Medications  Medication Dose Route Frequency Provider Last Rate Last Admin   cefTRIAXone (ROCEPHIN) 1 g in sodium chloride 0.9 % 100 mL IVPB  1 g Intravenous STAT Fransico Meadow, MD       doxycycline (VIBRA-TABS) tablet 100 mg  100 mg Oral Once Fransico Meadow, MD       esmolol (BREVIBLOC) 2000 mg / 100 mL (20 mg/mL) infusion  25-300 mcg/kg/min Intravenous Continuous Fransico Meadow, MD 20.4 mL/hr at 01/29/22 1335 150 mcg/kg/min at 01/29/22 1335  Current Outpatient Medications  Medication Sig Dispense Refill   albuterol (VENTOLIN HFA) 108 (90 Base) MCG/ACT inhaler Inhale 2 puffs into the lungs every 6 (six) hours as needed for wheezing or shortness of breath. 8.5 g 0   amLODipine (NORVASC) 10 MG tablet Take 1 tablet (10 mg total) by mouth daily. 90 tablet 0   apixaban (ELIQUIS) 2.5 MG TABS tablet Take 1 tablet (2.5 mg total) by mouth 2 (two) times daily. 60 tablet 0   carvedilol (COREG) 6.25 MG tablet Take 1 tablet (6.25 mg total) by mouth 2 (two) times daily with a meal. 60 tablet 1   Cholecalciferol (VITAMIN D-3 PO) Take 1 capsule by mouth daily.     cloNIDine (CATAPRES - DOSED IN MG/24 HR) 0.2 mg/24hr patch Place 1 patch (0.2 mg total) onto the skin every Saturday. 4 patch 12   hydrALAZINE (APRESOLINE) 50 MG tablet Take 1 tablet (50 mg total) by mouth 3 (three) times daily. 90 tablet 0   isosorbide mononitrate (IMDUR) 30 MG 24 hr tablet Take 2 tablets (60 mg total) by mouth daily. (Patient taking differently: Take 30 mg by mouth daily.) 30 tablet 0   losartan (COZAAR) 100 MG tablet Take 1 tablet (100 mg total) by mouth daily. 90 tablet 0   mirtazapine (REMERON) 7.5 MG tablet Take 1 tablet (7.5 mg total) by mouth at bedtime. (Patient taking differently: Take 7.5 mg by mouth at bedtime as needed (sleep).) 90 tablet 3   rosuvastatin (CRESTOR) 10 MG tablet Take 10 mg by  mouth daily.     sevelamer carbonate (RENVELA) 2.4 g PACK 2.4 g with breakfast, with lunch, and with evening meal.     torsemide 40 MG TABS Take 40 mg by mouth 2 (two) times daily. 60 tablet 0   Labs: Basic Metabolic Panel: Recent Labs  Lab 01/29/22 1128 01/29/22 1131 01/29/22 1132  NA 140 137 136  K 4.6 4.8 4.8  CL 98 99  --   CO2 27  --   --   GLUCOSE 109* 113*  --   BUN 41* 49*  --   CREATININE 7.97* 8.80*  --   CALCIUM 10.0  --   --    Liver Function Tests: No results for input(s): "AST", "ALT", "ALKPHOS", "BILITOT", "PROT", "ALBUMIN" in the last 168 hours. No results for input(s): "LIPASE", "AMYLASE" in the last 168 hours. No results for input(s): "AMMONIA" in the last 168 hours. CBC: Recent Labs  Lab 01/29/22 1128 01/29/22 1131 01/29/22 1132  WBC 7.2  --   --   NEUTROABS 5.7  --   --   HGB 10.3* 11.6* 11.6*  HCT 32.1* 34.0* 34.0*  MCV 97.0  --   --   PLT 281  --   --    Cardiac Enzymes: No results for input(s): "CKTOTAL", "CKMB", "CKMBINDEX", "TROPONINI" in the last 168 hours. CBG: No results for input(s): "GLUCAP" in the last 168 hours. Iron Studies: No results for input(s): "IRON", "TIBC", "TRANSFERRIN", "FERRITIN" in the last 72 hours. Studies/Results: CT Angio Chest/Abd/Pel for Dissection W and/or Wo Contrast  Result Date: 01/29/2022 CLINICAL DATA:  Chest and abdominal pain. Shortness of breath. History of thoracic aortic aneurysm repair. EXAM: CT ANGIOGRAPHY CHEST, ABDOMEN AND PELVIS TECHNIQUE: Non-contrast CT of the chest was initially obtained. Multidetector CT imaging through the chest, abdomen and pelvis was performed using the standard protocol during bolus administration of intravenous contrast. Multiplanar reconstructed images and MIPs were obtained and reviewed to evaluate the vascular anatomy. RADIATION  DOSE REDUCTION: This exam was performed according to the departmental dose-optimization program which includes automated exposure control, adjustment  of the mA and/or kV according to patient size and/or use of iterative reconstruction technique. CONTRAST:  198m OMNIPAQUE IOHEXOL 350 MG/ML SOLN COMPARISON:  12/07/2021.  06/05/2021. FINDINGS: CTA CHEST FINDINGS Cardiovascular: Sequelae of ascending thoracic aortic repair evident. Pre contrast imaging shows no hyperdense crescent in the wall of the thoracic aorta suggest the presence of an acute intramural hematoma. Pledgets in the region of the proximal ascending aorta compatible with the reported surgical history. Imaging after IV contrast administration reveals a small luminal outpouching in the ascending aorta just proximal to the origin of the right brachiocephalic artery (axial 424/8and coronal 48/9). This is unchanged over the prior 2 studies dating back to 06/05/2021 and is likely postsurgical in etiology. No evidence for dissection flap within the thoracic aorta. Arch vessel anatomy from the transverse aorta is widely patent. Right-sided central line tip is positioned in the right atrium. Pulmonary arteries are well opacified without evidence for pulmonary embolus. Mild atherosclerotic calcification is noted in the wall of the thoracic aorta. Heart is enlarged. No pericardial effusion. Reflux of contrast material into the hepatic venous anatomy suggests right heart dysfunction. Mediastinum/Nodes: No mediastinal lymphadenopathy. There is no hilar lymphadenopathy. The esophagus has normal imaging features. There is no axillary lymphadenopathy. Lungs/Pleura: Small to moderate right pleural effusion is similar to minimally progressive in the interval. Patchy ground-glass opacity is noted in both lungs, mildly progressive in the interval. Similar appearance of collapse/consolidative opacity in the anterior left lower lobe. Musculoskeletal: No worrisome lytic or sclerotic osseous abnormality. Review of the MIP images confirms the above findings. CTA ABDOMEN AND PELVIS FINDINGS VASCULAR Aorta: Normal caliber  aorta without aneurysm, dissection, vasculitis or significant stenosis. Atherosclerotic calcification evident. Celiac: Patent without evidence of aneurysm, dissection, vasculitis or significant stenosis. SMA: Patent without evidence of aneurysm, dissection, vasculitis or significant stenosis. Renals: Both renal arteries are patent without evidence of aneurysm, dissection, vasculitis, fibromuscular dysplasia or significant stenosis. IMA: Poorly opacified and patency cannot be confirmed. Inflow: Patent without evidence of aneurysm, dissection, vasculitis or significant stenosis. Veins: No obvious venous abnormality within the limitations of this arterial phase study. Review of the MIP images confirms the above findings. NON-VASCULAR Hepatobiliary: No suspicious focal arterial phase hyperenhancement. The vague hypodensity seen in the anterior liver on the previous exam is not evident today, potentially related to bolus timing. Gallbladder not well demonstrated. No intrahepatic or extrahepatic biliary dilation. Pancreas: No focal mass lesion. No dilatation of the main duct. No intraparenchymal cyst. No peripancreatic edema. Spleen: No splenomegaly. No focal mass lesion. Adrenals/Urinary Tract: No adrenal nodule or mass. 7 mm hyperattenuating or hyperenhancing subcapsular lesion noted upper pole left kidney on 149/6. This was not discernible on the previous exam. Right kidney unremarkable. No evidence for hydroureter. Bladder is nondistended. Stomach/Bowel: Stomach is nondistended. Duodenum is normally positioned as is the ligament of Treitz. No small bowel wall thickening. No small bowel dilatation. No gross colonic mass. No colonic wall thickening. Lymphatic: No abdominal lymphadenopathy. No pelvic sidewall lymphadenopathy. Reproductive: Calcified fibroids noted in the uterus. There is no adnexal mass. Other: Moderate volume free fluid seen in the pelvis, similar to prior. Musculoskeletal: 2 fascial defects are seen in  the left flank region, 1 of which is between the eleventh and twelfth ribs and the second is more posterior and below the left twelfth rib. Both hernias contain only fat and appearance is similar to prior.  Lumbar fusion hardware evident. No worrisome lytic or sclerotic osseous abnormality. Review of the MIP images confirms the above findings. IMPRESSION: 1. No evidence for acute intramural hematoma or dissection in the thoracic or abdominal aorta. 2. Status post ascending thoracic aortic repair with stable appearance since 06/05/2021 of small luminal outpouching in the ascending aorta adjacent to the origin of the right brachiocephalic artery and most likely postsurgical in etiology. 3. Small to moderate right pleural effusion is similar to minimally progressive in the interval. 4. Patchy ground-glass opacity in both lungs, mildly progressive in the interval. Imaging features may be related to pulmonary edema although atypical infection/inflammation could have this appearance. 5. 7 mm hyperattenuating or hyperenhancing subcapsular lesion upper pole left kidney, not discernible on the previous study. This may be a cyst complicated by proteinaceous debris or hemorrhage, but neoplasm is not excluded. Follow-up nonemergent outpatient MRI suggested to further evaluate. 6. Moderate volume free fluid in the pelvis, similar to prior. 7. Aortic Atherosclerosis (ICD10-I70.0). Electronically Signed   By: Misty Stanley M.D.   On: 01/29/2022 13:22   DG Chest Portable 1 View  Result Date: 01/29/2022 CLINICAL DATA:  Chest pain and shortness of breath EXAM: PORTABLE CHEST 1 VIEW COMPARISON:  Chest radiograph 01/06/2022 FINDINGS: The right-sided dialysis catheter is stable terminating in the right atrium. Median sternotomy wires are unchanged. The heart is enlarged, unchanged. The upper mediastinal contours are stable. There are suspected trace bilateral pleural effusions with mild adjacent atelectasis. There is no other focal  consolidation. There is no overt pulmonary edema. There is no pneumothorax The bones are stable. IMPRESSION: Cardiomegaly with suspected trace bilateral pleural effusions and mild bibasilar atelectasis. Electronically Signed   By: Valetta Mole M.D.   On: 01/29/2022 11:21    Dialysis Orders:  Center: [facility]  on TTS . 180NRe 3 hr 45 min BFR 400 DFR Auto 1.5 EDW 44kg 2K 2Ca UF profile 2 TDC No heparin Last post HD weight 48kg Mircera 75mg IV q 2 weeks- last dose 01/20/22 Venofer '100mg'$  IV q HD- received 3 doses so far Hectorol 215m IV q 2 weeks  Assessment/Plan:  Chest pain: Likely due to volume overload, planned for HD today. See below  ESRD:  On TTS schedule, HD today, will attempt 3L UF. Uses TDC, refuses permanent access because she says she is scared of needles.   Hypertension/volume: BP significantly elevated, suspect med noncompliance and volume overload. Home BP meds include amlodipine '10mg'$  QD, catapres 0.'2mg'$  patch, carvedilol 3.'125mg'$  BID, hydralazine '25mg'$  BID, imdur '10mg'$  3 tabs QD, losartan '100mg'$  daily. I am not sure if she is actually taking all these meds daily as she is not sure what she takes.   Anemia: Hb at goal. ESA recently dosed. Resume IV iron once infection ruled out  Metabolic bone disease: Calcium elevated, will hold VDRA. Phos at goal. Continue binders  Nutrition:  Will check albumin, continue renal diet/fluid restrictions A. Fib: tachycardic on arrival, now rate controlled. Management per admitting team.   SaAnice PaganiniPA-C 01/29/2022, 1:46 PM  [facility] Pager: [phone] Seen and examined independently.  Agree with note and exam as documented above by physician extender and as noted here.  Ms. CaErkers a pleasant female with a history of ESRD on HD TTS and HTN who presented to the hospital with shortness of breath.  Found to have afib with RVR earlier today.  She states she is going to outpatient HD and staying for treatment; as above is  not  getting to EDW.  She states dialyzes via catheter as she is afraid of AVF; we discussed most dangerous way to dialyze is through a catheter.   General adult female in bed in no acute distress HEENT normocephalic atraumatic extraocular movements intact sclera anicteric Neck supple trachea midline Lungs clear to auscultation bilaterally normal work of breathing at rest on supplemental oxygen via nasal cannula Heart S1S2 no rub Abdomen soft nontender nondistended Extremities no pitting edema  Psych normal mood and affect Access: RIJ tunn catheter  # Chest pain - overload and arrhythmia  - optimize volume status with HD  # ESRD  - HD per TTS schedule   # HTN  - optimize volume with HD  - note above concern for noncompliance with regimen   # Anemia CKD  - at goal. S/p recent ESA as above   # Metabolic bone disease  - ok to defer activated vit D for now with hypercalcemia  - continue home phos binders  # Atrial fibrillation with RVR  - Per primary team   # Acute hypoxic resp failure  - optimize volume status with HD   # Left renal lesion  - Noted on CTA and cannot exclude neoplasm  - outpatient follow-up imaging suggested    Monique Desanctis, MD 01/29/2022 3:12 PM

## 2022-01-29 NOTE — ED Provider Notes (Signed)
[facility] EMERGENCY DEPARTMENT Provider Note   CSN: [id number] Arrival date & time: 01/29/22  1028     History  Chief Complaint  Patient presents with   Abdominal Pain   Chest Pain   Shortness of Breath    Monique Henderson is a 71 y.o. female.  71 year old female with a history of atrial fibrillation on Eliquis, hypertension, aortic dissection status postrepair, breast cancer, ESRD on IHD who presents with chest pain.  Patient states that this morning she started having sharp chest pain that radiated down to her abdomen.  Says that she has also been having cough and dyspnea.  States that the cough is a dry cough.  Has had congestion.  Denies any known sick contacts or fevers.  Says that she has been taking her medication but is unsure of what they are.  Reports being compliant with her hemodialysis.   Abdominal Pain Associated symptoms: chest pain and shortness of breath   Chest Pain Associated symptoms: abdominal pain and shortness of breath   Shortness of Breath Associated symptoms: abdominal pain and chest pain    Past Medical History:  Diagnosis Date   AF (paroxysmal atrial fibrillation) ([place]) 05/29/2019   on Coumadin   Aortic atherosclerosis ([place]) 07/05/2019   Aortic dissection ([place]) 04/04/2019   s/p repair   Bone spur 2008   Right calcaneal foot spur   Breast cancer ([place]) 2004   Ductal carcinoma in situ of the left breast; S/P left partial mastectomy 02/26/2003; S/P re-excision of cranial and lateral margins11/18/2004.radiation   Cerebral thrombosis with cerebral infarction 05/22/2019   Chronic low back pain 06/22/2016   Chronic obstructive lung disease ([place]) 01/16/2017   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 Autumn Messing; S/P re-excision of superior and inferior margins 10/27/2012.    ESRD on hemodialysis ([place]) 05/29/2019   Essential hypertension 09/16/2006   GERD 09/16/2006    Hepatitis C    treated and RNA confirmed not detectable 01/2017   Insomnia 03/14/2015   Malnutrition of moderate degree 05/19/2019   Non compliance with medical treatment 12/04/2017   Normocytic anemia    With thrombocytosis   Osteoarthritis    Right ureteral stone 2002   S/P lumbar spinal fusion 01/18/2014   S/P lumbar decompressive laminectomy, fusion, and plating for lumbar spinal stenosis on 05/27/2009 by Dr. Eustace Moore.  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 Eustace Moore.     Tobacco use disorder 04/19/2009   Uterine fibroid    Wears dentures    top       Home Medications Prior to Admission medications   Medication Sig Start Date End Date Taking? Authorizing Provider  albuterol (VENTOLIN HFA) 108 (90 Base) MCG/ACT inhaler Inhale 2 puffs into the lungs every 6 (six) hours as needed for wheezing or shortness of breath. 07/30/20  Yes Maximiano Coss, NP  amLODipine (NORVASC) 10 MG tablet Take 1 tablet (10 mg total) by mouth daily. 01/10/22  Yes Charlynne Cousins, MD  apixaban (ELIQUIS) 2.5 MG TABS tablet Take 1 tablet (2.5 mg total) by mouth 2 (two) times daily. 08/24/21  Yes Paige, Weldon Picking, DO  carvedilol (COREG) 6.25 MG tablet Take 1 tablet (6.25 mg total) by mouth 2 (two) times daily with a meal. 01/10/22  Yes Charlynne Cousins, MD  Cholecalciferol (VITAMIN D-3 PO) Take 1 capsule by mouth daily.   Yes [provider]  cloNIDine (CATAPRES - DOSED IN MG/24 HR) 0.2 mg/24hr patch Place 1 patch (0.2 mg total) onto the skin every Saturday. 01/10/22  Yes Charlynne Cousins, MD  hydrALAZINE (APRESOLINE) 50 MG tablet Take 1 tablet (50 mg total) by mouth 3 (three) times daily. 01/10/22 02/09/22 Yes Charlynne Cousins, MD  isosorbide mononitrate (IMDUR) 30 MG 24 hr tablet Take 2  tablets (60 mg total) by mouth daily. Patient taking differently: Take 30 mg by mouth daily. 01/10/22  Yes Charlynne Cousins, MD  losartan (COZAAR) 100 MG tablet Take 1 tablet (100 mg total) by mouth daily. 02/14/21  Yes Maximiano Coss, NP  mirtazapine (REMERON) 7.5 MG tablet Take 1 tablet (7.5 mg total) by mouth at bedtime. Patient taking differently: Take 7.5 mg by mouth at bedtime as needed (sleep). 07/15/20  Yes Maximiano Coss, NP  rosuvastatin (CRESTOR) 10 MG tablet Take 10 mg by mouth daily.   Yes [provider]  sevelamer carbonate (RENVELA) 2.4 g PACK 2.4 g with breakfast, with lunch, and with evening meal.   Yes [provider]  torsemide 40 MG TABS Take 40 mg by mouth 2 (two) times daily. 01/10/22  Yes Charlynne Cousins, MD      Allergies    Shrimp [shellfish allergy], Bactroban [mupirocin], Tylenol [acetaminophen], and Zestril [lisinopril]    Review of Systems   Review of Systems  Respiratory:  Positive for shortness of breath.   Cardiovascular:  Positive for chest pain.  Gastrointestinal:  Positive for abdominal pain.    Physical Exam Updated Vital Signs BP (!) 181/115   Pulse 81   Temp 98.4 F (36.9 C) (Oral)   Resp 20   Ht '5\' 4"'$  (1.626 m)   Wt 45.4 kg   SpO2 100%   BMI 17.16 kg/m  Physical Exam Vitals and nursing note reviewed.  Constitutional:      General: She is not in acute distress.    Appearance: She is well-developed.  HENT:     Head: Normocephalic and atraumatic.     Right Ear: External ear normal.     Left Ear: External ear normal.     Nose: Nose normal.  Eyes:     Extraocular Movements: Extraocular movements intact.     Conjunctiva/sclera: Conjunctivae normal.     Pupils: Pupils are equal, round, and reactive to light.  Cardiovascular:     Rate and Rhythm: Tachycardia present. Rhythm irregular.     Heart sounds: No murmur heard.    Comments: Tunneled HD catheter in right upper chest.  Sternotomy scar present. Pulmonary:      Effort: Pulmonary effort is normal. No respiratory distress.     Breath sounds: Normal breath sounds.     Comments: On 4 L nasal cannula satting 93%. Abdominal:     General: Abdomen is flat. There is no distension.     Palpations: Abdomen is soft. There is no mass.     Tenderness: There is abdominal tenderness (Diffuse). There is no guarding.  Musculoskeletal:        General: No swelling.     Cervical back: Normal range of motion and neck supple.     Right lower leg: No edema.     Left lower leg: No edema.  Skin:    General: Skin is warm and dry.     Capillary Refill: Capillary refill takes less  than 2 seconds.  Neurological:     Mental Status: She is alert and oriented to person, place, and time. Mental status is at baseline.  Psychiatric:        Mood and Affect: Mood normal.     ED Results / Procedures / Treatments   Labs (all labs ordered are listed, but only abnormal results are displayed) Labs Reviewed  BASIC METABOLIC PANEL - Abnormal; Notable for the following components:      Result Value   Glucose, Bld 109 (*)    BUN 41 (*)    Creatinine, Ser 7.97 (*)    GFR, Estimated 5 (*)    All other components within normal limits  MAGNESIUM - Abnormal; Notable for the following components:   Magnesium 2.5 (*)    All other components within normal limits  BRAIN NATRIURETIC PEPTIDE - Abnormal; Notable for the following components:   B Natriuretic Peptide >4,500.0 (*)    All other components within normal limits  CBC WITH DIFFERENTIAL/PLATELET - Abnormal; Notable for the following components:   RBC 3.31 (*)    Hemoglobin 10.3 (*)    HCT 32.1 (*)    RDW 18.5 (*)    All other components within normal limits  I-STAT CHEM 8, ED - Abnormal; Notable for the following components:   BUN 49 (*)    Creatinine, Ser 8.80 (*)    Glucose, Bld 113 (*)    Calcium, Ion 1.08 (*)    Hemoglobin 11.6 (*)    HCT 34.0 (*)    All other components within normal limits  I-STAT ARTERIAL  BLOOD GAS, ED - Abnormal; Notable for the following components:   pH, Arterial 7.508 (*)    pO2, Arterial 61 (*)    Bicarbonate 30.2 (*)    Acid-Base Excess 7.0 (*)    Calcium, Ion 1.08 (*)    HCT 34.0 (*)    Hemoglobin 11.6 (*)    All other components within normal limits  TROPONIN I (HIGH SENSITIVITY) - Abnormal; Notable for the following components:   Troponin I (High Sensitivity) 144 (*)    All other components within normal limits  TROPONIN I (HIGH SENSITIVITY) - Abnormal; Notable for the following components:   Troponin I (High Sensitivity) 141 (*)    All other components within normal limits  RESP PANEL BY RT-PCR (FLU A&B, COVID) ARPGX2  APTT  PROTIME-INR  HEPATITIS B SURFACE ANTIGEN  HEPATITIS B SURFACE ANTIBODY,QUALITATIVE  HEPATITIS B SURFACE ANTIBODY, QUANTITATIVE  HEPATITIS B CORE ANTIBODY, TOTAL  HEPATITIS C ANTIBODY  CBG MONITORING, ED  TYPE AND SCREEN    EKG EKG Interpretation  Date/Time:  Thursday January 29 2022 12:24:48 EDT Ventricular Rate:  93 PR Interval:  162 QRS Duration: 141 QT Interval:  404 QTC Calculation: 503 R Axis:   77 Text Interpretation: Sinus rhythm Biatrial enlargement Right bundle branch block Baseline wander in lead(s) II III aVL aVF V4 V5 V6 When compared to 01/06/22 poor baseline but no significant changes Confirmed by Margaretmary Eddy [phone]) on 01/29/2022 12:40:23 PM  Radiology CT Angio Chest/Abd/Pel for Dissection W and/or Wo Contrast  Result Date: 01/29/2022 CLINICAL DATA:  Chest and abdominal pain. Shortness of breath. History of thoracic aortic aneurysm repair. EXAM: CT ANGIOGRAPHY CHEST, ABDOMEN AND PELVIS TECHNIQUE: Non-contrast CT of the chest was initially obtained. Multidetector CT imaging through the chest, abdomen and pelvis was performed using the standard protocol during bolus administration of intravenous contrast. Multiplanar reconstructed images and MIPs were obtained and reviewed to  evaluate the vascular anatomy.  RADIATION DOSE REDUCTION: This exam was performed according to the departmental dose-optimization program which includes automated exposure control, adjustment of the mA and/or kV according to patient size and/or use of iterative reconstruction technique. CONTRAST:  157m OMNIPAQUE IOHEXOL 350 MG/ML SOLN COMPARISON:  12/07/2021.  06/05/2021. FINDINGS: CTA CHEST FINDINGS Cardiovascular: Sequelae of ascending thoracic aortic repair evident. Pre contrast imaging shows no hyperdense crescent in the wall of the thoracic aorta suggest the presence of an acute intramural hematoma. Pledgets in the region of the proximal ascending aorta compatible with the reported surgical history. Imaging after IV contrast administration reveals a small luminal outpouching in the ascending aorta just proximal to the origin of the right brachiocephalic artery (axial 483/1and coronal 48/9). This is unchanged over the prior 2 studies dating back to 06/05/2021 and is likely postsurgical in etiology. No evidence for dissection flap within the thoracic aorta. Arch vessel anatomy from the transverse aorta is widely patent. Right-sided central line tip is positioned in the right atrium. Pulmonary arteries are well opacified without evidence for pulmonary embolus. Mild atherosclerotic calcification is noted in the wall of the thoracic aorta. Heart is enlarged. No pericardial effusion. Reflux of contrast material into the hepatic venous anatomy suggests right heart dysfunction. Mediastinum/Nodes: No mediastinal lymphadenopathy. There is no hilar lymphadenopathy. The esophagus has normal imaging features. There is no axillary lymphadenopathy. Lungs/Pleura: Small to moderate right pleural effusion is similar to minimally progressive in the interval. Patchy ground-glass opacity is noted in both lungs, mildly progressive in the interval. Similar appearance of collapse/consolidative opacity in the anterior left lower lobe. Musculoskeletal: No worrisome  lytic or sclerotic osseous abnormality. Review of the MIP images confirms the above findings. CTA ABDOMEN AND PELVIS FINDINGS VASCULAR Aorta: Normal caliber aorta without aneurysm, dissection, vasculitis or significant stenosis. Atherosclerotic calcification evident. Celiac: Patent without evidence of aneurysm, dissection, vasculitis or significant stenosis. SMA: Patent without evidence of aneurysm, dissection, vasculitis or significant stenosis. Renals: Both renal arteries are patent without evidence of aneurysm, dissection, vasculitis, fibromuscular dysplasia or significant stenosis. IMA: Poorly opacified and patency cannot be confirmed. Inflow: Patent without evidence of aneurysm, dissection, vasculitis or significant stenosis. Veins: No obvious venous abnormality within the limitations of this arterial phase study. Review of the MIP images confirms the above findings. NON-VASCULAR Hepatobiliary: No suspicious focal arterial phase hyperenhancement. The vague hypodensity seen in the anterior liver on the previous exam is not evident today, potentially related to bolus timing. Gallbladder not well demonstrated. No intrahepatic or extrahepatic biliary dilation. Pancreas: No focal mass lesion. No dilatation of the main duct. No intraparenchymal cyst. No peripancreatic edema. Spleen: No splenomegaly. No focal mass lesion. Adrenals/Urinary Tract: No adrenal nodule or mass. 7 mm hyperattenuating or hyperenhancing subcapsular lesion noted upper pole left kidney on 149/6. This was not discernible on the previous exam. Right kidney unremarkable. No evidence for hydroureter. Bladder is nondistended. Stomach/Bowel: Stomach is nondistended. Duodenum is normally positioned as is the ligament of Treitz. No small bowel wall thickening. No small bowel dilatation. No gross colonic mass. No colonic wall thickening. Lymphatic: No abdominal lymphadenopathy. No pelvic sidewall lymphadenopathy. Reproductive: Calcified fibroids noted in  the uterus. There is no adnexal mass. Other: Moderate volume free fluid seen in the pelvis, similar to prior. Musculoskeletal: 2 fascial defects are seen in the left flank region, 1 of which is between the eleventh and twelfth ribs and the second is more posterior and below the left twelfth rib. Both hernias contain only fat and  appearance is similar to prior. Lumbar fusion hardware evident. No worrisome lytic or sclerotic osseous abnormality. Review of the MIP images confirms the above findings. IMPRESSION: 1. No evidence for acute intramural hematoma or dissection in the thoracic or abdominal aorta. 2. Status post ascending thoracic aortic repair with stable appearance since 06/05/2021 of small luminal outpouching in the ascending aorta adjacent to the origin of the right brachiocephalic artery and most likely postsurgical in etiology. 3. Small to moderate right pleural effusion is similar to minimally progressive in the interval. 4. Patchy ground-glass opacity in both lungs, mildly progressive in the interval. Imaging features may be related to pulmonary edema although atypical infection/inflammation could have this appearance. 5. 7 mm hyperattenuating or hyperenhancing subcapsular lesion upper pole left kidney, not discernible on the previous study. This may be a cyst complicated by proteinaceous debris or hemorrhage, but neoplasm is not excluded. Follow-up nonemergent outpatient MRI suggested to further evaluate. 6. Moderate volume free fluid in the pelvis, similar to prior. 7. Aortic Atherosclerosis (ICD10-I70.0). Electronically Signed   By: Misty Stanley M.D.   On: 01/29/2022 13:22   DG Chest Portable 1 View  Result Date: 01/29/2022 CLINICAL DATA:  Chest pain and shortness of breath EXAM: PORTABLE CHEST 1 VIEW COMPARISON:  Chest radiograph 01/06/2022 FINDINGS: The right-sided dialysis catheter is stable terminating in the right atrium. Median sternotomy wires are unchanged. The heart is enlarged,  unchanged. The upper mediastinal contours are stable. There are suspected trace bilateral pleural effusions with mild adjacent atelectasis. There is no other focal consolidation. There is no overt pulmonary edema. There is no pneumothorax The bones are stable. IMPRESSION: Cardiomegaly with suspected trace bilateral pleural effusions and mild bibasilar atelectasis. Electronically Signed   By: Valetta Mole M.D.   On: 01/29/2022 11:21    Procedures Procedures   Medications Ordered in ED Medications  Chlorhexidine Gluconate Cloth 2 % PADS 6 each (has no administration in time range)  amLODipine (NORVASC) tablet 10 mg (has no administration in time range)  carvedilol (COREG) tablet 6.25 mg (has no administration in time range)  cloNIDine (CATAPRES - Dosed in mg/24 hr) patch 0.2 mg (has no administration in time range)  hydrALAZINE (APRESOLINE) tablet 50 mg (has no administration in time range)  isosorbide mononitrate (IMDUR) 24 hr tablet 30 mg (has no administration in time range)  losartan (COZAAR) tablet 100 mg (has no administration in time range)  rosuvastatin (CRESTOR) tablet 10 mg (has no administration in time range)  torsemide (DEMADEX) tablet 40 mg (has no administration in time range)  mirtazapine (REMERON) tablet 7.5 mg (has no administration in time range)  sevelamer carbonate (RENVELA) powder PACK 2.4 g (has no administration in time range)  apixaban (ELIQUIS) tablet 2.5 mg (has no administration in time range)  albuterol (PROVENTIL) (2.5 MG/3ML) 0.083% nebulizer solution 2.5 mg (has no administration in time range)  zolpidem (AMBIEN) tablet 5 mg (has no administration in time range)  sorbitol 70 % solution 30 mL (has no administration in time range)  docusate sodium (ENEMEEZ) enema 283 mg (has no administration in time range)  camphor-menthol (SARNA) lotion 1 Application (has no administration in time range)    And  hydrOXYzine (ATARAX) tablet 25 mg (has no administration in time  range)  calcium carbonate (dosed in mg elemental calcium) suspension 500 mg of elemental calcium (has no administration in time range)  feeding supplement (NEPRO CARB STEADY) liquid 237 mL (has no administration in time range)  sodium chloride flush (NS) 0.9 % injection  3 mL (has no administration in time range)  hydrALAZINE (APRESOLINE) injection 5 mg (has no administration in time range)  HYDROmorphone (DILAUDID) injection 0.5 mg (0.5 mg Intravenous Given 01/29/22 1102)  esmolol (BREVIBLOC) bolus via infusion 22,700 mcg (22,700 mcg Intravenous Bolus from Bag 01/29/22 1217)  ondansetron (ZOFRAN) injection 4 mg (4 mg Intravenous Given 01/29/22 1223)  iohexol (OMNIPAQUE) 350 MG/ML injection 100 mL (100 mLs Intravenous Contrast Given 01/29/22 1258)  cefTRIAXone (ROCEPHIN) 1 g in sodium chloride 0.9 % 100 mL IVPB (0 g Intravenous Stopped 01/29/22 1538)  doxycycline (VIBRA-TABS) tablet 100 mg (100 mg Oral Given 01/29/22 1427)  metoprolol tartrate (LOPRESSOR) injection 5 mg (5 mg Intravenous Given 01/29/22 1425)    ED Course/ Medical Decision Making/ A&P Clinical Course as of 01/29/22 1628  Thu Jan 29, 2022  1206 DG Chest Portable 1 View IMPRESSION: Cardiomegaly with suspected trace bilateral pleural effusions and mild bibasilar atelectasis. [RP]  1255 Troponin I (High Sensitivity)(!!): 144 Lower than to 290 three weeks ago. [RP]  1337 Spoke with on call nephrologist. They will arrange for iHD.  Also spoke with Trish from cardiology who will follow the patient.  Feels that the chest pain and elevated troponin are likely rate related in the setting of her A-fib with RVR. [RP]  1401 Spoke with on-call hospitalist Karmen Bongo who agrees that the patient will be suitable for stepdown if she is able to have the esmolol drip discontinued. [RP]    Clinical Course User Index [RP] Fransico Meadow, MD                           Medical Decision Making Amount and/or Complexity of Data Reviewed Labs:  ordered. Decision-making details documented in ED Course. Radiology: ordered. Decision-making details documented in ED Course.  Risk Prescription drug management. Decision regarding hospitalization.   71 year old female with a history of atrial fibrillation on Eliquis, hypertension, aortic dissection status postrepair, breast cancer, ESRD on IHD who presents with chest pain.   Initial Ddx:  Atrial fibrillation with RVR, aortic dissection, MI, pneumonia, COVID, pulmonary embolism, volume overload from ESRD  MDM:  Feel the patient is likely experiencing atrial fibrillation with RVR and possible demand ischemia causing her chest pain.  Also high up on the differential with description of her pain and history would be a worsening aortic dissection so we will work the patient up for this.  Patient also could have cardiogenic pulmonary edema from her severely elevated blood pressure.  Considered pulmonary embolism but feel that this is less likely than dissection so will pursue CTA dissection protocol.  Also on the differential is pneumonia and COVID.   Plan:  Labs Troponin Chest x-ray EKG COVID CTA chest, abdomen, and pelvis Esmolol gtt  ED Summary:  Patient was started on esmolol drip and required bolus for atrial fibrillation with RVR at a rate of approximately 150.  Heart rate came down to the 80s and 90s.  Repeat EKG without ischemic changes.  She was eventually transitioned to IV metoprolol prior to admission.  Her blood pressure did improve and her chest pain resolved.  She was taken back to CT scan which did not show evidence of dissection but did show interstitial infiltrates that are concerning for fluid versus infection so the patient was started on ceftriaxone and doxycycline given her prolonged QTc.  Nephrology was consulted regarding the patient's volume overload and hypertensive emergency who will perform dialysis on the patient today.  Also contacted cardiology who felt that her  elevated troponin was likely due to demand in the setting of her atrial fibrillation with RVR.  Patient is already on Eliquis so no need to start anticoagulation at this time.  She is then admitted to stepdown unit for further management.  Dispo: Admit to Step Down   Records reviewed Admission Notes and DC Summary The following labs were independently interpreted: Chemistry and Serial Troponins I independently visualized the following imaging with scope of interpretation limited to determining acute life threatening conditions related to emergency care: Chest x-ray, which revealed  no acute abnormality   Consults: Cardiology and Nephrology  CRITICAL CARE Performed by: Fransico Meadow   Total critical care time: 45 minutes  Critical care time was exclusive of separately billable procedures and treating other patients.  Critical care was necessary to treat or prevent imminent or life-threatening deterioration.  Critical care was time spent personally by me on the following activities: development of treatment plan with patient and/or surrogate as well as nursing, discussions with consultants, evaluation of patient's response to treatment, examination of patient, obtaining history from patient or surrogate, ordering and performing treatments and interventions, ordering and review of laboratory studies, ordering and review of radiographic studies, pulse oximetry and re-evaluation of patient's condition.  Final Clinical Impression(s) / ED Diagnoses Final diagnoses:  Atrial fibrillation with RVR (HCC)  Chest pain, unspecified type  Hypertension, unspecified type  Hypertensive emergency    Rx / DC Orders ED Discharge Orders     None         Fransico Meadow, MD 01/29/22 [phone]

## 2022-01-30 DIAGNOSIS — E8770 Fluid overload, unspecified: Secondary | ICD-10-CM | POA: Diagnosis not present

## 2022-01-30 DIAGNOSIS — I480 Paroxysmal atrial fibrillation: Secondary | ICD-10-CM

## 2022-01-30 DIAGNOSIS — N289 Disorder of kidney and ureter, unspecified: Secondary | ICD-10-CM | POA: Diagnosis not present

## 2022-01-30 DIAGNOSIS — I5043 Acute on chronic combined systolic (congestive) and diastolic (congestive) heart failure: Secondary | ICD-10-CM | POA: Diagnosis not present

## 2022-01-30 LAB — CBC
HCT: 30.5 % — ABNORMAL LOW (ref 36.0–46.0)
Hemoglobin: 9.4 g/dL — ABNORMAL LOW (ref 12.0–15.0)
MCH: 30.6 pg (ref 26.0–34.0)
MCHC: 30.8 g/dL (ref 30.0–36.0)
MCV: 99.3 fL (ref 80.0–100.0)
Platelets: 244 10*3/uL (ref 150–400)
RBC: 3.07 MIL/uL — ABNORMAL LOW (ref 3.87–5.11)
RDW: 18.2 % — ABNORMAL HIGH (ref 11.5–15.5)
WBC: 5.8 10*3/uL (ref 4.0–10.5)
nRBC: 0 % (ref 0.0–0.2)

## 2022-01-30 LAB — BASIC METABOLIC PANEL
Anion gap: 12 (ref 5–15)
BUN: 15 mg/dL (ref 8–23)
CO2: 30 mmol/L (ref 22–32)
Calcium: 9.3 mg/dL (ref 8.9–10.3)
Chloride: 95 mmol/L — ABNORMAL LOW (ref 98–111)
Creatinine, Ser: 3.74 mg/dL — ABNORMAL HIGH (ref 0.44–1.00)
GFR, Estimated: 13 mL/min — ABNORMAL LOW (ref >60)
Glucose, Bld: 126 mg/dL — ABNORMAL HIGH (ref 70–99)
Potassium: 3.6 mmol/L (ref 3.5–5.1)
Sodium: 137 mmol/L (ref 135–145)

## 2022-01-30 MED ORDER — HEPARIN SODIUM (PORCINE) 1000 UNIT/ML IJ SOLN
INTRAMUSCULAR | Status: AC
  Filled 2022-01-30: qty 4

## 2022-01-30 NOTE — Discharge Summary (Signed)
PATIENT DETAILS Name: Monique Henderson Age: 71 y.o. Sex: female Date of Birth: 07/08/1950 MRN: [id number]. Admitting Physician: Karmen Bongo, MD PCP:Pcp, No  Admit Date: 01/29/2022 Discharge date: 01/30/2022  Recommendations for Outpatient Follow-up:  Follow up with PCP in 1-2 weeks Please obtain CMP/CBC in one week Please continue counseling regarding compliance to antihypertensive medication Incidental finding of 7 mm lesion in the left kidney-needs outpatient nonurgent MRI to further evaluate.  Admitted From:  Home  Disposition: Home   Discharge Condition: good  CODE STATUS:   Code Status: DNR   Diet recommendation:  Diet Order             Diet - low sodium heart healthy           Diet renal with fluid restriction Fluid restriction: 1200 mL Fluid; Room service appropriate? Yes; Fluid consistency: Thin  Diet effective now                    Brief Summary: 71 year old with history of ESRD on HD TTS, chronic HFrEF, A-fib on Eliquis, HTN, AAA with dissection s/p repair who presented with shortness of breath/chest pain due to uncontrolled hypertension (did not take antihypertensives x2 days)  and volume overload.  She was evaluated by nephrology-underwent hemodialysis overnight-this morning she feels significantly better-blood pressure still on the higher side but has not gotten her morning medications yet.  Case was discussed with nephrologist Dr. Konrad Felix plans for HD today-recommendations are for outpatient HD at her usual schedule tomorrow.  See below for further details.  Brief Hospital Course: Acute on chronic HFrEF Volume overload Per nephrology-has not gotten to her dry weight after HD-poor compliance to antihypertensives-suspect poor compliance to dietary restriction as well.  Evaluated by nephrology-underwent HD earlier this morning-volume status is stable-nephrology does not plan any further HD today-recommendations are to pursue further HD in the  outpatient setting.  Patient has had numerous recurrent hospitalizations for similar issues.  Hypertensive urgency: Due to noncompliance-acknowledges not taking medications x2 days.  Plan is to place her back on her usual medications-counseling provided regarding importance of compliance.  ESRD: On HD TTS-Per nephrology-okay to discharge today-resume HD in the outpatient setting starting tomorrow at her usual schedule.  Minimally elevated troponin: No chest pain this morning.  Troponin levels are similar to prior elevation.  Do not think this represents ACS-likely secondary to demand ischemia.  PAF: On Eliquis.  History of aortic dissection-s/p repair: CTA chest/abdomen was negative for any significant abnormalities including PE.  BMI: Estimated body mass index is 17.9 kg/m as calculated from the following:   Height as of this encounter: '5\' 4"'$  (1.626 m).   Weight as of this encounter: 47.3 kg.    Discharge Diagnoses:  Principal Problem:   Hypervolemia associated with renal insufficiency Active Problems:   Troponin level elevated   Acute on chronic combined systolic and diastolic CHF (congestive heart failure) (HCC)   AF (paroxysmal atrial fibrillation) (HCC)   ESRD on dialysis ([place])   Malignant hypertension   Prolonged QT interval   Dyslipidemia   DNR (do not resuscitate)   Discharge Instructions:  Activity:  As tolerated  Discharge Instructions     (HEART FAILURE PATIENTS) Call MD:  Anytime you have any of the following symptoms: 1) 3 pound weight gain in 24 hours or 5 pounds in 1 week 2) shortness of breath, with or without a dry hacking cough 3) swelling in the hands, feet or stomach 4) if you have to sleep  on extra pillows at night in order to breathe.   Complete by: As directed    Call MD for:  difficulty breathing, headache or visual disturbances   Complete by: As directed    Diet - low sodium heart healthy   Complete by: As directed    Discharge instructions    Complete by: As directed    Follow with Primary MD in 1-2 weeks  Incidental finding-you have a 7 mm lesion in your left kidney-you will need a outpatient MRI to delineate this lesion further.In some cases these can turn cancerous  Please take all your blood pressure medications as prescribed.  Please follow-up with your outpatient dialysis center at the prior schedule.  Please get a complete blood count and chemistry panel checked by your Primary MD at your next visit, and again as instructed by your Primary MD.  Get Medicines reviewed and adjusted: Please take all your medications with you for your next visit with your Primary MD  Laboratory/radiological data: Please request your Primary MD to go over all hospital tests and procedure/radiological results at the follow up, please ask your Primary MD to get all Hospital records sent to his/her office.  In some cases, they will be blood work, cultures and biopsy results pending at the time of your discharge. Please request that your primary care M.D. follows up on these results.  Also Note the following: If you experience worsening of your admission symptoms, develop shortness of breath, life threatening emergency, suicidal or homicidal thoughts you must seek medical attention immediately by calling 911 or calling your MD immediately  if symptoms less severe.  You must read complete instructions/literature along with all the possible adverse reactions/side effects for all the Medicines you take and that have been prescribed to you. Take any new Medicines after you have completely understood and accpet all the possible adverse reactions/side effects.   Do not drive when taking Pain medications or sleeping medications (Benzodaizepines)  Do not take more than prescribed Pain, Sleep and Anxiety Medications. It is not advisable to combine anxiety,sleep and pain medications without talking with your primary care practitioner  Special  Instructions: If you have smoked or chewed Tobacco  in the last 2 yrs please stop smoking, stop any regular Alcohol  and or any Recreational drug use.  Wear Seat belts while driving.  Please note: You were cared for by a hospitalist during your hospital stay. Once you are discharged, your primary care physician will handle any further medical issues. Please note that NO REFILLS for any discharge medications will be authorized once you are discharged, as it is imperative that you return to your primary care physician (or establish a relationship with a primary care physician if you do not have one) for your post hospital discharge needs so that they can reassess your need for medications and monitor your lab values.   Increase activity slowly   Complete by: As directed       Allergies as of 01/30/2022       Reactions   Shrimp [shellfish Allergy] Shortness Of Breath   Bactroban [mupirocin] Other (See Comments)   "Sores in nose"   Tylenol [acetaminophen] Itching   Zestril [lisinopril] Cough        Medication List     TAKE these medications    albuterol 108 (90 Base) MCG/ACT inhaler Commonly known as: VENTOLIN HFA Inhale 2 puffs into the lungs every 6 (six) hours as needed for wheezing or shortness of breath.  amLODipine 10 MG tablet Commonly known as: NORVASC Take 1 tablet (10 mg total) by mouth daily.   apixaban 2.5 MG Tabs tablet Commonly known as: ELIQUIS Take 1 tablet (2.5 mg total) by mouth 2 (two) times daily.   carvedilol 6.25 MG tablet Commonly known as: COREG Take 1 tablet (6.25 mg total) by mouth 2 (two) times daily with a meal.   cloNIDine 0.2 mg/24hr patch Commonly known as: CATAPRES - Dosed in mg/24 hr Place 1 patch (0.2 mg total) onto the skin every Saturday.   hydrALAZINE 50 MG tablet Commonly known as: APRESOLINE Take 1 tablet (50 mg total) by mouth 3 (three) times daily.   isosorbide mononitrate 30 MG 24 hr tablet Commonly known as: IMDUR Take 2  tablets (60 mg total) by mouth daily. What changed: how much to take   losartan 100 MG tablet Commonly known as: COZAAR Take 1 tablet (100 mg total) by mouth daily.   mirtazapine 7.5 MG tablet Commonly known as: REMERON Take 1 tablet (7.5 mg total) by mouth at bedtime. What changed:  when to take this reasons to take this   rosuvastatin 10 MG tablet Commonly known as: CRESTOR Take 10 mg by mouth daily.   sevelamer carbonate 2.4 g Pack Commonly known as: RENVELA 2.4 g with breakfast, with lunch, and with evening meal.   Torsemide 40 MG Tabs Take 40 mg by mouth 2 (two) times daily.   VITAMIN D-3 PO Take 1 capsule by mouth daily.        Allergies  Allergen Reactions   Shrimp [Shellfish Allergy] Shortness Of Breath   Bactroban [Mupirocin] Other (See Comments)    "Sores in nose"   Tylenol [Acetaminophen] Itching   Zestril [Lisinopril] Cough     Other Procedures/Studies: CT Angio Chest/Abd/Pel for Dissection W and/or Wo Contrast  Result Date: 01/29/2022 CLINICAL DATA:  Chest and abdominal pain. Shortness of breath. History of thoracic aortic aneurysm repair. EXAM: CT ANGIOGRAPHY CHEST, ABDOMEN AND PELVIS TECHNIQUE: Non-contrast CT of the chest was initially obtained. Multidetector CT imaging through the chest, abdomen and pelvis was performed using the standard protocol during bolus administration of intravenous contrast. Multiplanar reconstructed images and MIPs were obtained and reviewed to evaluate the vascular anatomy. RADIATION DOSE REDUCTION: This exam was performed according to the departmental dose-optimization program which includes automated exposure control, adjustment of the mA and/or kV according to patient size and/or use of iterative reconstruction technique. CONTRAST:  149m OMNIPAQUE IOHEXOL 350 MG/ML SOLN COMPARISON:  12/07/2021.  06/05/2021. FINDINGS: CTA CHEST FINDINGS Cardiovascular: Sequelae of ascending thoracic aortic repair evident. Pre contrast imaging  shows no hyperdense crescent in the wall of the thoracic aorta suggest the presence of an acute intramural hematoma. Pledgets in the region of the proximal ascending aorta compatible with the reported surgical history. Imaging after IV contrast administration reveals a small luminal outpouching in the ascending aorta just proximal to the origin of the right brachiocephalic artery (axial 493/7and coronal 48/9). This is unchanged over the prior 2 studies dating back to 06/05/2021 and is likely postsurgical in etiology. No evidence for dissection flap within the thoracic aorta. Arch vessel anatomy from the transverse aorta is widely patent. Right-sided central line tip is positioned in the right atrium. Pulmonary arteries are well opacified without evidence for pulmonary embolus. Mild atherosclerotic calcification is noted in the wall of the thoracic aorta. Heart is enlarged. No pericardial effusion. Reflux of contrast material into the hepatic venous anatomy suggests right heart dysfunction. Mediastinum/Nodes: No mediastinal  lymphadenopathy. There is no hilar lymphadenopathy. The esophagus has normal imaging features. There is no axillary lymphadenopathy. Lungs/Pleura: Small to moderate right pleural effusion is similar to minimally progressive in the interval. Patchy ground-glass opacity is noted in both lungs, mildly progressive in the interval. Similar appearance of collapse/consolidative opacity in the anterior left lower lobe. Musculoskeletal: No worrisome lytic or sclerotic osseous abnormality. Review of the MIP images confirms the above findings. CTA ABDOMEN AND PELVIS FINDINGS VASCULAR Aorta: Normal caliber aorta without aneurysm, dissection, vasculitis or significant stenosis. Atherosclerotic calcification evident. Celiac: Patent without evidence of aneurysm, dissection, vasculitis or significant stenosis. SMA: Patent without evidence of aneurysm, dissection, vasculitis or significant stenosis. Renals: Both  renal arteries are patent without evidence of aneurysm, dissection, vasculitis, fibromuscular dysplasia or significant stenosis. IMA: Poorly opacified and patency cannot be confirmed. Inflow: Patent without evidence of aneurysm, dissection, vasculitis or significant stenosis. Veins: No obvious venous abnormality within the limitations of this arterial phase study. Review of the MIP images confirms the above findings. NON-VASCULAR Hepatobiliary: No suspicious focal arterial phase hyperenhancement. The vague hypodensity seen in the anterior liver on the previous exam is not evident today, potentially related to bolus timing. Gallbladder not well demonstrated. No intrahepatic or extrahepatic biliary dilation. Pancreas: No focal mass lesion. No dilatation of the main duct. No intraparenchymal cyst. No peripancreatic edema. Spleen: No splenomegaly. No focal mass lesion. Adrenals/Urinary Tract: No adrenal nodule or mass. 7 mm hyperattenuating or hyperenhancing subcapsular lesion noted upper pole left kidney on 149/6. This was not discernible on the previous exam. Right kidney unremarkable. No evidence for hydroureter. Bladder is nondistended. Stomach/Bowel: Stomach is nondistended. Duodenum is normally positioned as is the ligament of Treitz. No small bowel wall thickening. No small bowel dilatation. No gross colonic mass. No colonic wall thickening. Lymphatic: No abdominal lymphadenopathy. No pelvic sidewall lymphadenopathy. Reproductive: Calcified fibroids noted in the uterus. There is no adnexal mass. Other: Moderate volume free fluid seen in the pelvis, similar to prior. Musculoskeletal: 2 fascial defects are seen in the left flank region, 1 of which is between the eleventh and twelfth ribs and the second is more posterior and below the left twelfth rib. Both hernias contain only fat and appearance is similar to prior. Lumbar fusion hardware evident. No worrisome lytic or sclerotic osseous abnormality. Review of the  MIP images confirms the above findings. IMPRESSION: 1. No evidence for acute intramural hematoma or dissection in the thoracic or abdominal aorta. 2. Status post ascending thoracic aortic repair with stable appearance since 06/05/2021 of small luminal outpouching in the ascending aorta adjacent to the origin of the right brachiocephalic artery and most likely postsurgical in etiology. 3. Small to moderate right pleural effusion is similar to minimally progressive in the interval. 4. Patchy ground-glass opacity in both lungs, mildly progressive in the interval. Imaging features may be related to pulmonary edema although atypical infection/inflammation could have this appearance. 5. 7 mm hyperattenuating or hyperenhancing subcapsular lesion upper pole left kidney, not discernible on the previous study. This may be a cyst complicated by proteinaceous debris or hemorrhage, but neoplasm is not excluded. Follow-up nonemergent outpatient MRI suggested to further evaluate. 6. Moderate volume free fluid in the pelvis, similar to prior. 7. Aortic Atherosclerosis (ICD10-I70.0). Electronically Signed   By: Misty Stanley M.D.   On: 01/29/2022 13:22   DG Chest Portable 1 View  Result Date: 01/29/2022 CLINICAL DATA:  Chest pain and shortness of breath EXAM: PORTABLE CHEST 1 VIEW COMPARISON:  Chest radiograph 01/06/2022 FINDINGS: The  right-sided dialysis catheter is stable terminating in the right atrium. Median sternotomy wires are unchanged. The heart is enlarged, unchanged. The upper mediastinal contours are stable. There are suspected trace bilateral pleural effusions with mild adjacent atelectasis. There is no other focal consolidation. There is no overt pulmonary edema. There is no pneumothorax The bones are stable. IMPRESSION: Cardiomegaly with suspected trace bilateral pleural effusions and mild bibasilar atelectasis. Electronically Signed   By: Valetta Mole M.D.   On: 01/29/2022 11:21   DG Chest Portable 1  View  Result Date: 01/06/2022 CLINICAL DATA:  Missed dialysis, hypertensive emergency, short of breath and chest tightness similar to prior. EXAM: PORTABLE CHEST - 1 VIEW COMPARISON:  12/24/2021, 12/07/2021 FINDINGS: The mediastinal contours are within normal limits. Unchanged cardiomegaly. Atherosclerotic calcification of the aortic arch. Unchanged position of indwelling right internal jugular tunneled hemodialysis catheter with the catheter tip in the right atrium. Mild bibasilar hazy opacities, slightly increased from comparison. No pneumothorax or pleural effusion. Again seen are multiple surgical clips projecting over the right axilla and right breast. No acute osseous abnormality. IMPRESSION: 1. Minimal bibasilar subsegmental atelectasis versus pulmonary edema. 2. Unchanged position of indwelling right IJ tunneled hemodialysis catheter. 3. Unchanged cardiomegaly. Electronically Signed   By: Ruthann Cancer M.D.   On: 01/06/2022 09:45     TODAY-DAY OF DISCHARGE:  Subjective:   Monique Henderson today has no headache,no chest abdominal pain,no new weakness tingling or numbness, feels much better wants to go home today.   Objective:   Blood pressure (!) 158/102, pulse 86, temperature 98.2 F (36.8 C), temperature source Oral, resp. rate 18, height '5\' 4"'$  (1.626 m), weight 47.3 kg, SpO2 95 %.  Intake/Output Summary (Last 24 hours) at 01/30/2022 0921 Last data filed at 01/30/2022 0459 Gross per 24 hour  Intake 60 ml  Output 2500 ml  Net -2440 ml   Filed Weights   01/29/22 2254 01/30/22 0256 01/30/22 0500  Weight: 47.5 kg 45 kg 47.3 kg    Exam: Awake Alert, Oriented *3, No new F.N deficits, Normal affect [place].AT,PERRAL Supple Neck,No JVD, No cervical lymphadenopathy appriciated.  Symmetrical Chest wall movement, Good air movement bilaterally, CTAB RRR,No Gallops,Rubs or new Murmurs, No Parasternal Heave +ve B.Sounds, Abd Soft, Non tender, No organomegaly appriciated, No rebound -guarding or  rigidity. No Cyanosis, Clubbing or edema, No new Rash or bruise   PERTINENT RADIOLOGIC STUDIES: CT Angio Chest/Abd/Pel for Dissection W and/or Wo Contrast  Result Date: 01/29/2022 CLINICAL DATA:  Chest and abdominal pain. Shortness of breath. History of thoracic aortic aneurysm repair. EXAM: CT ANGIOGRAPHY CHEST, ABDOMEN AND PELVIS TECHNIQUE: Non-contrast CT of the chest was initially obtained. Multidetector CT imaging through the chest, abdomen and pelvis was performed using the standard protocol during bolus administration of intravenous contrast. Multiplanar reconstructed images and MIPs were obtained and reviewed to evaluate the vascular anatomy. RADIATION DOSE REDUCTION: This exam was performed according to the departmental dose-optimization program which includes automated exposure control, adjustment of the mA and/or kV according to patient size and/or use of iterative reconstruction technique. CONTRAST:  141m OMNIPAQUE IOHEXOL 350 MG/ML SOLN COMPARISON:  12/07/2021.  06/05/2021. FINDINGS: CTA CHEST FINDINGS Cardiovascular: Sequelae of ascending thoracic aortic repair evident. Pre contrast imaging shows no hyperdense crescent in the wall of the thoracic aorta suggest the presence of an acute intramural hematoma. Pledgets in the region of the proximal ascending aorta compatible with the reported surgical history. Imaging after IV contrast administration reveals a small luminal outpouching in the ascending aorta just  proximal to the origin of the right brachiocephalic artery (axial 34/7 and coronal 48/9). This is unchanged over the prior 2 studies dating back to 06/05/2021 and is likely postsurgical in etiology. No evidence for dissection flap within the thoracic aorta. Arch vessel anatomy from the transverse aorta is widely patent. Right-sided central line tip is positioned in the right atrium. Pulmonary arteries are well opacified without evidence for pulmonary embolus. Mild atherosclerotic  calcification is noted in the wall of the thoracic aorta. Heart is enlarged. No pericardial effusion. Reflux of contrast material into the hepatic venous anatomy suggests right heart dysfunction. Mediastinum/Nodes: No mediastinal lymphadenopathy. There is no hilar lymphadenopathy. The esophagus has normal imaging features. There is no axillary lymphadenopathy. Lungs/Pleura: Small to moderate right pleural effusion is similar to minimally progressive in the interval. Patchy ground-glass opacity is noted in both lungs, mildly progressive in the interval. Similar appearance of collapse/consolidative opacity in the anterior left lower lobe. Musculoskeletal: No worrisome lytic or sclerotic osseous abnormality. Review of the MIP images confirms the above findings. CTA ABDOMEN AND PELVIS FINDINGS VASCULAR Aorta: Normal caliber aorta without aneurysm, dissection, vasculitis or significant stenosis. Atherosclerotic calcification evident. Celiac: Patent without evidence of aneurysm, dissection, vasculitis or significant stenosis. SMA: Patent without evidence of aneurysm, dissection, vasculitis or significant stenosis. Renals: Both renal arteries are patent without evidence of aneurysm, dissection, vasculitis, fibromuscular dysplasia or significant stenosis. IMA: Poorly opacified and patency cannot be confirmed. Inflow: Patent without evidence of aneurysm, dissection, vasculitis or significant stenosis. Veins: No obvious venous abnormality within the limitations of this arterial phase study. Review of the MIP images confirms the above findings. NON-VASCULAR Hepatobiliary: No suspicious focal arterial phase hyperenhancement. The vague hypodensity seen in the anterior liver on the previous exam is not evident today, potentially related to bolus timing. Gallbladder not well demonstrated. No intrahepatic or extrahepatic biliary dilation. Pancreas: No focal mass lesion. No dilatation of the main duct. No intraparenchymal cyst. No  peripancreatic edema. Spleen: No splenomegaly. No focal mass lesion. Adrenals/Urinary Tract: No adrenal nodule or mass. 7 mm hyperattenuating or hyperenhancing subcapsular lesion noted upper pole left kidney on 149/6. This was not discernible on the previous exam. Right kidney unremarkable. No evidence for hydroureter. Bladder is nondistended. Stomach/Bowel: Stomach is nondistended. Duodenum is normally positioned as is the ligament of Treitz. No small bowel wall thickening. No small bowel dilatation. No gross colonic mass. No colonic wall thickening. Lymphatic: No abdominal lymphadenopathy. No pelvic sidewall lymphadenopathy. Reproductive: Calcified fibroids noted in the uterus. There is no adnexal mass. Other: Moderate volume free fluid seen in the pelvis, similar to prior. Musculoskeletal: 2 fascial defects are seen in the left flank region, 1 of which is between the eleventh and twelfth ribs and the second is more posterior and below the left twelfth rib. Both hernias contain only fat and appearance is similar to prior. Lumbar fusion hardware evident. No worrisome lytic or sclerotic osseous abnormality. Review of the MIP images confirms the above findings. IMPRESSION: 1. No evidence for acute intramural hematoma or dissection in the thoracic or abdominal aorta. 2. Status post ascending thoracic aortic repair with stable appearance since 06/05/2021 of small luminal outpouching in the ascending aorta adjacent to the origin of the right brachiocephalic artery and most likely postsurgical in etiology. 3. Small to moderate right pleural effusion is similar to minimally progressive in the interval. 4. Patchy ground-glass opacity in both lungs, mildly progressive in the interval. Imaging features may be related to pulmonary edema although atypical infection/inflammation could have  this appearance. 5. 7 mm hyperattenuating or hyperenhancing subcapsular lesion upper pole left kidney, not discernible on the previous  study. This may be a cyst complicated by proteinaceous debris or hemorrhage, but neoplasm is not excluded. Follow-up nonemergent outpatient MRI suggested to further evaluate. 6. Moderate volume free fluid in the pelvis, similar to prior. 7. Aortic Atherosclerosis (ICD10-I70.0). Electronically Signed   By: Misty Stanley M.D.   On: 01/29/2022 13:22   DG Chest Portable 1 View  Result Date: 01/29/2022 CLINICAL DATA:  Chest pain and shortness of breath EXAM: PORTABLE CHEST 1 VIEW COMPARISON:  Chest radiograph 01/06/2022 FINDINGS: The right-sided dialysis catheter is stable terminating in the right atrium. Median sternotomy wires are unchanged. The heart is enlarged, unchanged. The upper mediastinal contours are stable. There are suspected trace bilateral pleural effusions with mild adjacent atelectasis. There is no other focal consolidation. There is no overt pulmonary edema. There is no pneumothorax The bones are stable. IMPRESSION: Cardiomegaly with suspected trace bilateral pleural effusions and mild bibasilar atelectasis. Electronically Signed   By: Valetta Mole M.D.   On: 01/29/2022 11:21     PERTINENT LAB RESULTS: CBC: Recent Labs    01/29/22 1128 01/29/22 1131 01/29/22 1132 01/30/22 0720  WBC 7.2  --   --  5.8  HGB 10.3*   < > 11.6* 9.4*  HCT 32.1*   < > 34.0* 30.5*  PLT 281  --   --  244   < > = values in this interval not displayed.   CMET CMP     Component Value Date/Time   NA 137 01/30/2022 0720   NA 140 05/13/2020 1108   NA 142 09/28/2012 0831   K 3.6 01/30/2022 0720   K 4.2 09/28/2012 0831   CL 95 (L) 01/30/2022 0720   CL 103 09/28/2012 0831   CO2 30 01/30/2022 0720   CO2 30 (H) 09/28/2012 0831   GLUCOSE 126 (H) 01/30/2022 0720   GLUCOSE 103 (H) 09/28/2012 0831   BUN 15 01/30/2022 0720   BUN 47 (H) 05/13/2020 1108   BUN 18.3 09/28/2012 0831   CREATININE 3.74 (H) 01/30/2022 0720   CREATININE 2.09 (H) 01/18/2017 1145   CREATININE 1.2 (H) 09/28/2012 0831   CALCIUM 9.3  01/30/2022 0720   CALCIUM 9.4 09/28/2012 0831   PROT 6.9 01/06/2022 1046   PROT 6.9 05/13/2020 1108   PROT 7.2 09/28/2012 0831   ALBUMIN 3.1 (L) 01/08/2022 0944   ALBUMIN 4.0 05/13/2020 1108   ALBUMIN 3.5 09/28/2012 0831   AST 21 01/06/2022 1046   AST 35 (H) 09/28/2012 0831   ALT 22 01/06/2022 1046   ALT 23 12/09/2016 1458   ALT 41 09/28/2012 0831   ALKPHOS 102 01/06/2022 1046   ALKPHOS 89 09/28/2012 0831   BILITOT 0.8 01/06/2022 1046   BILITOT 0.2 05/13/2020 1108   BILITOT 0.34 09/28/2012 0831   GFRNONAA 13 (L) 01/30/2022 0720   GFRNONAA 23 (L) 12/09/2016 1458   GFRAA 8 (L) 05/13/2020 1108   GFRAA 26 (L) 12/09/2016 1458    GFR Estimated Creatinine Clearance: 10.6 mL/min (A) (by C-G formula based on SCr of 3.74 mg/dL (H)). No results for input(s): "LIPASE", "AMYLASE" in the last 72 hours. No results for input(s): "CKTOTAL", "CKMB", "CKMBINDEX", "TROPONINI" in the last 72 hours. Invalid input(s): "POCBNP" No results for input(s): "DDIMER" in the last 72 hours. No results for input(s): "HGBA1C" in the last 72 hours. No results for input(s): "CHOL", "HDL", "LDLCALC", "TRIG", "CHOLHDL", "LDLDIRECT" in the last 72  hours. No results for input(s): "TSH", "T4TOTAL", "T3FREE", "THYROIDAB" in the last 72 hours.  Invalid input(s): "FREET3" No results for input(s): "VITAMINB12", "FOLATE", "FERRITIN", "TIBC", "IRON", "RETICCTPCT" in the last 72 hours. Coags: Recent Labs    01/29/22 1128  INR 1.1   Microbiology: Recent Results (from the past 240 hour(s))  Resp Panel by RT-PCR (Flu A&B, Covid) Anterior Nasal Swab     Status: None   Collection Time: 01/29/22 10:55 AM   Specimen: Anterior Nasal Swab  Result Value Ref Range Status   SARS Coronavirus 2 by RT PCR NEGATIVE NEGATIVE Final    Comment: (NOTE) SARS-CoV-2 target nucleic acids are NOT DETECTED.  The SARS-CoV-2 RNA is generally detectable in upper respiratory specimens during the acute phase of infection. The  lowest concentration of SARS-CoV-2 viral copies this assay can detect is 138 copies/mL. A negative result does not preclude SARS-Cov-2 infection and should not be used as the sole basis for treatment or other patient management decisions. A negative result may occur with  improper specimen collection/handling, submission of specimen other than nasopharyngeal swab, presence of viral mutation(s) within the areas targeted by this assay, and inadequate number of viral copies(<138 copies/mL). A negative result must be combined with clinical observations, patient history, and epidemiological information. The expected result is Negative.  Fact Sheet for Patients:  EntrepreneurPulse.com.au  Fact Sheet for Healthcare Providers:  IncredibleEmployment.be  This test is no t yet approved or cleared by the [place] FDA and  has been authorized for detection and/or diagnosis of SARS-CoV-2 by FDA under an Emergency Use Authorization (EUA). This EUA will remain  in effect (meaning this test can be used) for the duration of the COVID-19 declaration under Section 564(b)(1) of the Act, 21 U.S.C.section 360bbb-3(b)(1), unless the authorization is terminated  or revoked sooner.       Influenza A by PCR NEGATIVE NEGATIVE Final   Influenza B by PCR NEGATIVE NEGATIVE Final    Comment: (NOTE) The Xpert Xpress SARS-CoV-2/FLU/RSV plus assay is intended as an aid in the diagnosis of influenza from Nasopharyngeal swab specimens and should not be used as a sole basis for treatment. Nasal washings and aspirates are unacceptable for Xpert Xpress SARS-CoV-2/FLU/RSV testing.  Fact Sheet for Patients: EntrepreneurPulse.com.au  Fact Sheet for Healthcare Providers: IncredibleEmployment.be  This test is not yet approved or cleared by the [place] FDA and has been authorized for detection and/or diagnosis of SARS-CoV-2 by FDA under  an Emergency Use Authorization (EUA). This EUA will remain in effect (meaning this test can be used) for the duration of the COVID-19 declaration under Section 564(b)(1) of the Act, 21 U.S.C. section 360bbb-3(b)(1), unless the authorization is terminated or revoked.  Performed at [facility], [place] [address], [place], [place] [address]     FURTHER DISCHARGE INSTRUCTIONS:  Get Medicines reviewed and adjusted: Please take all your medications with you for your next visit with your Primary MD  Laboratory/radiological data: Please request your Primary MD to go over all hospital tests and procedure/radiological results at the follow up, please ask your Primary MD to get all Hospital records sent to his/her office.  In some cases, they will be blood work, cultures and biopsy results pending at the time of your discharge. Please request that your primary care M.D. goes through all the records of your hospital data and follows up on these results.  Also Note the following: If you experience worsening of your admission symptoms, develop shortness of breath, life threatening emergency,  suicidal or homicidal thoughts you must seek medical attention immediately by calling 911 or calling your MD immediately  if symptoms less severe.  You must read complete instructions/literature along with all the possible adverse reactions/side effects for all the Medicines you take and that have been prescribed to you. Take any new Medicines after you have completely understood and accpet all the possible adverse reactions/side effects.   Do not drive when taking Pain medications or sleeping medications (Benzodaizepines)  Do not take more than prescribed Pain, Sleep and Anxiety Medications. It is not advisable to combine anxiety,sleep and pain medications without talking with your primary care practitioner  Special Instructions: If you have smoked or chewed Tobacco  in the last 2 yrs please stop smoking,  stop any regular Alcohol  and or any Recreational drug use.  Wear Seat belts while driving.  Please note: You were cared for by a hospitalist during your hospital stay. Once you are discharged, your primary care physician will handle any further medical issues. Please note that NO REFILLS for any discharge medications will be authorized once you are discharged, as it is imperative that you return to your primary care physician (or establish a relationship with a primary care physician if you do not have one) for your post hospital discharge needs so that they can reassess your need for medications and monitor your lab values.  Total Time spent coordinating discharge including counseling, education and face to face time equals less than 30 minutes.  SignedOren Binet 01/30/2022 9:21 AM

## 2022-01-30 NOTE — Progress Notes (Signed)
Received patient in bed to unit.  Alert and oriented.  Informed consent signed and in chart.   Treatment initiated: 2247  Treatment completed: 0250  Patient tolerated well.  Transported back to the room  Alert, without acute distress.  Hand-off given to patient's nurse.   Access used: cath Access issues: none  Total UF removed: 2500 Medication(s) given: none Post HD VS: 98.6 188/10/ 88 23 95% Post HD weight: 45 kg   [facility]

## 2022-01-30 NOTE — Progress Notes (Signed)
[facility] Progress Note   71 y.o. female with PMH including ESRD on dialysis, A.fib, HTN, COPD who presented to the ED today with chest pain radiating to her abdomen + cough and congestion. ED notable for significant HTN with BP 183/145. K+ 4.8, BUN 49, Cr 8.8, Hb 11.6, WBC 7.2. BNP >45000. CT chest notable for patchy infiltrates in both lungs, consistent with pulmonary edema. Pt has had recurrent hospital admissions for hypertension and volume overload. BP is sometimes at goal at her outpatient dialysis unit. Seems to have some inconsistency in medication adherence. She has been attending all of her dialysis sessions lately but not reaching her EDW.   Dialysis Orders:  Center: [facility]  on TTS . 180NRe 3 hr 45 min BFR 400 DFR Auto 1.5 EDW 44kg 2K 2Ca UF profile 2 TDC No heparin Last post HD weight 48kg Mircera 54mg IV q 2 weeks- last dose 01/20/22 Venofer '100mg'$  IV q HD- received 3 doses so far Hectorol 260m IV q 2 weeks   Assessment/ Plan:    Chest pain: Likely due to volume overload, planned for HD today. See below  ESRD:  On TTS schedule, HD overnight finishing very early this am with 2.5L net UF. Uses TDC, refuses permanent access because she says she is scared of needles. Looks much better; OK for d/c for renal standpoint and will need to go to HD on Sat.  Hypertension/volume: BP elevated, suspect med noncompliance and volume overload. Home BP meds include amlodipine '10mg'$  QD, catapres 0.'2mg'$  patch, carvedilol 3.'125mg'$  BID, hydralazine '25mg'$  BID, imdur '10mg'$  3 tabs QD, losartan '100mg'$  daily. I am not convinced if she is actually taking all these meds daily.   Anemia: Hb at goal. ESA recently dosed. Resume IV iron once infection ruled out  Metabolic bone disease: Calcium elevated, will hold VDRA. Phos at goal. Continue binders  Nutrition:  Will check albumin, continue renal diet/fluid restrictions A. Fib: tachycardic on arrival, now rate controlled. Management per admitting team.    Subjective:   Denies SOB, CP, nausea, vomiting, fevers.   Objective:   BP (!) 179/104 (BP Location: Right Arm)   Pulse 95   Temp 98.5 F (36.9 C) (Oral)   Resp 18   Ht '5\' 4"'$  (1.626 m)   Wt 47.3 kg   SpO2 95%   BMI 17.90 kg/m   Intake/Output Summary (Last 24 hours) at 01/30/2022 0719 Last data filed at 01/30/2022 0459 Gross per 24 hour  Intake 60 ml  Output 2500 ml  Net -2440 ml   Weight change:   Physical Exam: General: Thin female, alert and in no acute distress. Head: NCAT Lungs: + rhonchi bilateral lower lobes Heart: RRR with normal S1, S2.  Abdomen: SNDNT + BS Lower extremities: No edema or ischemic changes, no open wounds. Neuro: Alert and oriented X 3. Moves all extremities spontaneously. Psych:  Responds to questions appropriately with a normal affect. [facility]  Imaging: CT Angio Chest/Abd/Pel for Dissection W and/or Wo Contrast  Result Date: 01/29/2022 CLINICAL DATA:  Chest and abdominal pain. Shortness of breath. History of thoracic aortic aneurysm repair. EXAM: CT ANGIOGRAPHY CHEST, ABDOMEN AND PELVIS TECHNIQUE: Non-contrast CT of the chest was initially obtained. Multidetector CT imaging through the chest, abdomen and pelvis was performed using the standard protocol during bolus administration of intravenous contrast. Multiplanar reconstructed images and MIPs were obtained and reviewed to evaluate the vascular anatomy. RADIATION DOSE REDUCTION: This exam was performed according to the departmental dose-optimization  program which includes automated exposure control, adjustment of the mA and/or kV according to patient size and/or use of iterative reconstruction technique. CONTRAST:  121m OMNIPAQUE IOHEXOL 350 MG/ML SOLN COMPARISON:  12/07/2021.  06/05/2021. FINDINGS: CTA CHEST FINDINGS Cardiovascular: Sequelae of ascending thoracic aortic repair evident. Pre contrast imaging shows no hyperdense crescent in the wall of the thoracic aorta  suggest the presence of an acute intramural hematoma. Pledgets in the region of the proximal ascending aorta compatible with the reported surgical history. Imaging after IV contrast administration reveals a small luminal outpouching in the ascending aorta just proximal to the origin of the right brachiocephalic artery (axial 429/5and coronal 48/9). This is unchanged over the prior 2 studies dating back to 06/05/2021 and is likely postsurgical in etiology. No evidence for dissection flap within the thoracic aorta. Arch vessel anatomy from the transverse aorta is widely patent. Right-sided central line tip is positioned in the right atrium. Pulmonary arteries are well opacified without evidence for pulmonary embolus. Mild atherosclerotic calcification is noted in the wall of the thoracic aorta. Heart is enlarged. No pericardial effusion. Reflux of contrast material into the hepatic venous anatomy suggests right heart dysfunction. Mediastinum/Nodes: No mediastinal lymphadenopathy. There is no hilar lymphadenopathy. The esophagus has normal imaging features. There is no axillary lymphadenopathy. Lungs/Pleura: Small to moderate right pleural effusion is similar to minimally progressive in the interval. Patchy ground-glass opacity is noted in both lungs, mildly progressive in the interval. Similar appearance of collapse/consolidative opacity in the anterior left lower lobe. Musculoskeletal: No worrisome lytic or sclerotic osseous abnormality. Review of the MIP images confirms the above findings. CTA ABDOMEN AND PELVIS FINDINGS VASCULAR Aorta: Normal caliber aorta without aneurysm, dissection, vasculitis or significant stenosis. Atherosclerotic calcification evident. Celiac: Patent without evidence of aneurysm, dissection, vasculitis or significant stenosis. SMA: Patent without evidence of aneurysm, dissection, vasculitis or significant stenosis. Renals: Both renal arteries are patent without evidence of aneurysm,  dissection, vasculitis, fibromuscular dysplasia or significant stenosis. IMA: Poorly opacified and patency cannot be confirmed. Inflow: Patent without evidence of aneurysm, dissection, vasculitis or significant stenosis. Veins: No obvious venous abnormality within the limitations of this arterial phase study. Review of the MIP images confirms the above findings. NON-VASCULAR Hepatobiliary: No suspicious focal arterial phase hyperenhancement. The vague hypodensity seen in the anterior liver on the previous exam is not evident today, potentially related to bolus timing. Gallbladder not well demonstrated. No intrahepatic or extrahepatic biliary dilation. Pancreas: No focal mass lesion. No dilatation of the main duct. No intraparenchymal cyst. No peripancreatic edema. Spleen: No splenomegaly. No focal mass lesion. Adrenals/Urinary Tract: No adrenal nodule or mass. 7 mm hyperattenuating or hyperenhancing subcapsular lesion noted upper pole left kidney on 149/6. This was not discernible on the previous exam. Right kidney unremarkable. No evidence for hydroureter. Bladder is nondistended. Stomach/Bowel: Stomach is nondistended. Duodenum is normally positioned as is the ligament of Treitz. No small bowel wall thickening. No small bowel dilatation. No gross colonic mass. No colonic wall thickening. Lymphatic: No abdominal lymphadenopathy. No pelvic sidewall lymphadenopathy. Reproductive: Calcified fibroids noted in the uterus. There is no adnexal mass. Other: Moderate volume free fluid seen in the pelvis, similar to prior. Musculoskeletal: 2 fascial defects are seen in the left flank region, 1 of which is between the eleventh and twelfth ribs and the second is more posterior and below the left twelfth rib. Both hernias contain only fat and appearance is similar to prior. Lumbar fusion hardware evident. No worrisome lytic or sclerotic osseous abnormality. Review  of the MIP images confirms the above findings. IMPRESSION: 1. No  evidence for acute intramural hematoma or dissection in the thoracic or abdominal aorta. 2. Status post ascending thoracic aortic repair with stable appearance since 06/05/2021 of small luminal outpouching in the ascending aorta adjacent to the origin of the right brachiocephalic artery and most likely postsurgical in etiology. 3. Small to moderate right pleural effusion is similar to minimally progressive in the interval. 4. Patchy ground-glass opacity in both lungs, mildly progressive in the interval. Imaging features may be related to pulmonary edema although atypical infection/inflammation could have this appearance. 5. 7 mm hyperattenuating or hyperenhancing subcapsular lesion upper pole left kidney, not discernible on the previous study. This may be a cyst complicated by proteinaceous debris or hemorrhage, but neoplasm is not excluded. Follow-up nonemergent outpatient MRI suggested to further evaluate. 6. Moderate volume free fluid in the pelvis, similar to prior. 7. Aortic Atherosclerosis (ICD10-I70.0). Electronically Signed   By: Misty Stanley M.D.   On: 01/29/2022 13:22   DG Chest Portable 1 View  Result Date: 01/29/2022 CLINICAL DATA:  Chest pain and shortness of breath EXAM: PORTABLE CHEST 1 VIEW COMPARISON:  Chest radiograph 01/06/2022 FINDINGS: The right-sided dialysis catheter is stable terminating in the right atrium. Median sternotomy wires are unchanged. The heart is enlarged, unchanged. The upper mediastinal contours are stable. There are suspected trace bilateral pleural effusions with mild adjacent atelectasis. There is no other focal consolidation. There is no overt pulmonary edema. There is no pneumothorax The bones are stable. IMPRESSION: Cardiomegaly with suspected trace bilateral pleural effusions and mild bibasilar atelectasis. Electronically Signed   By: Valetta Mole M.D.   On: 01/29/2022 11:21    Labs: BMET Recent Labs  Lab 01/29/22 1128 01/29/22 1131 01/29/22 1132  NA 140  137 136  K 4.6 4.8 4.8  CL 98 99  --   CO2 27  --   --   GLUCOSE 109* 113*  --   BUN 41* 49*  --   CREATININE 7.97* 8.80*  --   CALCIUM 10.0  --   --    CBC Recent Labs  Lab 01/29/22 1128 01/29/22 1131 01/29/22 1132  WBC 7.2  --   --   NEUTROABS 5.7  --   --   HGB 10.3* 11.6* 11.6*  HCT 32.1* 34.0* 34.0*  MCV 97.0  --   --   PLT 281  --   --     Medications:     amLODipine  10 mg Oral Daily   apixaban  2.5 mg Oral BID   carvedilol  6.25 mg Oral BID WC   Chlorhexidine Gluconate Cloth  6 each Topical Q0600   [START ON 01/31/2022] cloNIDine  0.2 mg Transdermal Q Sat   heparin sodium (porcine)       hydrALAZINE  50 mg Oral TID   isosorbide mononitrate  30 mg Oral Daily   losartan  100 mg Oral Daily   rosuvastatin  10 mg Oral Daily   sevelamer carbonate  2.4 g Oral TID with meals   sodium chloride flush  3 mL Intravenous Q12H   torsemide  40 mg Oral BID      Otelia Santee, MD 01/30/2022, 7:19 AM

## 2022-01-30 NOTE — Progress Notes (Signed)
D/C order noted. Contacted [facility] SW to advise clinic of pt's d/c today and that pt will resume care tomorrow.   Markiya Keefe Renal Navigator [phone] 

## 2022-01-30 NOTE — Discharge Instructions (Signed)

## 2022-01-30 NOTE — Evaluation (Signed)
Occupational Therapy Evaluation and Discharge Patient Details Name: Monique Henderson MRN: [id number] DOB: [birth year] Today's Date: 01/30/2022   History of Present Illness 71 y.o. female [facility] on [facility] with chest pain, suspect due to volume overload, cough, congestion. PMH includes ESRD TTS, HTN, PAF, polysubstance abuse, aortic aneurysm dissection s/p repair.   Clinical Impression   Pt is functioning independently to modified independent in ADLs and mobility. No OT or DME needs.      Recommendations for follow up therapy are one component of a multi-disciplinary discharge planning process, led by the attending physician.  Recommendations may be updated based on patient status, additional functional criteria and insurance authorization.   Follow Up Recommendations  No OT follow up    Assistance Recommended at Discharge PRN  Patient can return home with the following Assist for transportation    Functional Status Assessment  Patient has had a recent decline in their functional status and demonstrates the ability to make significant improvements in function in a reasonable and predictable amount of time.  Equipment Recommendations  None recommended by OT    Recommendations for Other Services       Precautions / Restrictions Precautions Precautions: None Restrictions Weight Bearing Restrictions: No      Mobility Bed Mobility Overal bed mobility: Modified Independent             General bed mobility comments: HOB up    Transfers Overall transfer level: Modified independent Equipment used: None               General transfer comment: slow to rise, no assist      Balance Overall balance assessment: Modified Independent                                         ADL either performed or assessed with clinical judgement   ADL Overall ADL's : Independent                                             Vision Patient  Visual Report: No change from baseline       Perception     Praxis      Pertinent Vitals/Pain Pain Assessment Pain Assessment: No/denies pain     Hand Dominance Right   Extremity/Trunk Assessment Upper Extremity Assessment Upper Extremity Assessment: Overall WFL for tasks assessed   Lower Extremity Assessment Lower Extremity Assessment: Defer to PT evaluation   Cervical / Trunk Assessment Cervical / Trunk Assessment: Normal   Communication Communication Communication: No difficulties   Cognition Arousal/Alertness: Awake/alert Behavior During Therapy: WFL for tasks assessed/performed Overall Cognitive Status: Within Functional Limits for tasks assessed                                 General Comments: sleepy     General Comments  vss    Exercises     Shoulder Instructions      Home Living Family/patient expects to be discharged to:: Private residence Living Arrangements: Spouse/significant other Available Help at Discharge: Family;Available 24 hours/day Type of Home: Apartment Home Access: Level entry     Home Layout: Two level;Able to live on main level with bedroom/bathroom   Alternate Level  Stairs-Rails: Right Bathroom Shower/Tub: Teacher, early years/pre: Standard     Home Equipment: Conservation officer, nature (2 wheels);Cane - single point;BSC/3in1          Prior Functioning/Environment Prior Level of Function : Independent/Modified Independent                        OT Problem List:        OT Treatment/Interventions:      OT Goals(Current goals can be found in the care plan section)    OT Frequency:      Co-evaluation              AM-PAC OT "6 Clicks" Daily Activity     Outcome Measure Help from another person eating meals?: None Help from another person taking care of personal grooming?: None Help from another person toileting, which includes using toliet, bedpan, or urinal?: None Help from another  person bathing (including washing, rinsing, drying)?: None Help from another person to put on and taking off regular upper body clothing?: None Help from another person to put on and taking off regular lower body clothing?: None 6 Click Score: 24   End of Session    Activity Tolerance: Patient tolerated treatment well Patient left: in bed;with call bell/phone within reach  OT Visit Diagnosis: Muscle weakness (generalized) (M62.81)                Time: 1005-1017 OT Time Calculation (min): 12 min Charges:  OT General Charges $OT Visit: 1 Visit OT Evaluation $OT Eval Low Complexity: 1 Low  Cleta Alberts, OTR/L Acute Rehabilitation Services Office: [phone]   [name] 01/30/2022, 10:17 AM

## 2022-01-30 NOTE — Evaluation (Signed)
Physical Therapy Evaluation Patient Details Name: Monique Henderson MRN: [id number] DOB: 1950/11/25 Today'Henderson Date: 01/30/2022  History of Present Illness  71 y.o. female presents to [facility] on 8/31 with chest pain, suspect due to volume overload, cough, congestion. PMH includes ESRD TTS, HTN, PAF, polysubstance abuse, aortic aneurysm dissection Henderson/p repair.  Clinical Impression   Pt presents with increased time to mobilize, but is overall mod I for mobility including hallway-distance gait. Pt reports feeling she is at mobility baseline, eager to d/c home. Pt with no further acute or post-acute PT needs.       Recommendations for follow up therapy are one component of a multi-disciplinary discharge planning process, led by the attending physician.  Recommendations may be updated based on patient status, additional functional criteria and insurance authorization.  Follow Up Recommendations No PT follow up      Assistance Recommended at Discharge PRN  Patient can return home with the following       Equipment Recommendations None recommended by PT  Recommendations for Other Services       Functional Status Assessment Patient has not had a recent decline in their functional status     Precautions / Restrictions Precautions Precautions: Fall Restrictions Weight Bearing Restrictions: No      Mobility  Bed Mobility Overal bed mobility: Modified Independent                  Transfers Overall transfer level: Modified independent Equipment used: None               General transfer comment: no physical assist or use of environment to rise    Ambulation/Gait Ambulation/Gait assistance: Modified independent (Device/Increase time) Gait Distance (Feet): 150 Feet Assistive device: None Gait Pattern/deviations: Step-through pattern, Decreased stride length Gait velocity: decr     General Gait Details: slightly slowed but steady gait, no physical assist  Stairs             Wheelchair Mobility    Modified Rankin (Stroke Patients Only)       Balance Overall balance assessment: Modified Independent                                           Pertinent Vitals/Pain Pain Assessment Pain Assessment: No/denies pain    Home Living Family/patient expects to be discharged to:: Private residence Living Arrangements: Spouse/significant other Available Help at Discharge: Family;Available 24 hours/day Type of Home: Apartment Home Access: Level entry       Home Layout: Two level;Able to live on main level with bedroom/bathroom Home Equipment: Rolling Walker (2 wheels);Cane - single point;BSC/3in1      Prior Function Prior Level of Function : Independent/Modified Independent                     Hand Dominance   Dominant Hand: Right    Extremity/Trunk Assessment   Upper Extremity Assessment Upper Extremity Assessment: Defer to OT evaluation    Lower Extremity Assessment Lower Extremity Assessment: Overall WFL for tasks assessed    Cervical / Trunk Assessment Cervical / Trunk Assessment: Normal  Communication   Communication: No difficulties  Cognition Arousal/Alertness: Awake/alert Behavior During Therapy: WFL for tasks assessed/performed Overall Cognitive Status: Within Functional Limits for tasks assessed  General Comments General comments (skin integrity, edema, etc.): vss    Exercises     Assessment/Plan    PT Assessment Patient does not need any further PT services  PT Problem List         PT Treatment Interventions      PT Goals (Current goals can be found in the Care Plan section)  Acute Rehab PT Goals Patient Stated Goal: home PT Goal Formulation: With patient Time For Goal Achievement: 01/30/22 Potential to Achieve Goals: Good    Frequency       Co-evaluation               AM-PAC PT "6 Clicks" Mobility  Outcome  Measure Help needed turning from your back to your side while in a flat bed without using bedrails?: None Help needed moving from lying on your back to sitting on the side of a flat bed without using bedrails?: None Help needed moving to and from a bed to a chair (including a wheelchair)?: None Help needed standing up from a chair using your arms (e.g., wheelchair or bedside chair)?: None Help needed to walk in hospital room?: None Help needed climbing 3-5 steps with a railing? : A Little 6 Click Score: 23    End of Session   Activity Tolerance: Patient tolerated treatment well Patient left: in bed;with call bell/phone within reach Nurse Communication: Mobility status PT Visit Diagnosis: Other abnormalities of gait and mobility (R26.89)    Time: 0626-9485 PT Time Calculation (min) (ACUTE ONLY): 12 min   Charges:   PT Evaluation $PT Eval Low Complexity: 1 Low         Monique Henderson, PT DPT Acute Rehabilitation Services Pager [phone]  Office [phone]   Monique Henderson 01/30/2022, 9:11 AM

## 2022-01-30 NOTE — Progress Notes (Signed)
Patient arrived from dialysis in no acute distress.  She complained of chest pain and itching.  PRN med given with effectiveness. She is alert and oriented.  In bed resting.  Will continue to monitor.

## 2022-01-31 ENCOUNTER — Telehealth: Payer: Self-pay | Admitting: Nurse Practitioner

## 2022-01-31 LAB — HEPATITIS B SURFACE ANTIBODY, QUANTITATIVE: Hep B S AB Quant (Post): 122.8 m[IU]/mL (ref >9.9)

## 2022-01-31 NOTE — Telephone Encounter (Signed)
Transition of care contact from inpatient facility  Date of Discharge: 01/30/2022 Date of Contact: 01/31/2022 Method of contact: Phone  Attempted to contact patient to discuss transition of care from inpatient admission. Patient did not answer the phone. Unable to leave message D/T mailbox being full.

## 2022-02-06 ENCOUNTER — Ambulatory Visit: Payer: Medicaid Other | Admitting: Nurse Practitioner

## 2022-02-23 ENCOUNTER — Emergency Department (HOSPITAL_COMMUNITY): Payer: Medicare Other

## 2022-02-23 ENCOUNTER — Inpatient Hospital Stay (HOSPITAL_COMMUNITY)
Admission: EM | Admit: 2022-02-23 | Discharge: 2022-02-26 | DRG: 064 | Disposition: A | Discharge status: Rehabilitation Facility | Payer: Medicare Other | Attending: Family Medicine | Admitting: Family Medicine

## 2022-02-23 ENCOUNTER — Other Ambulatory Visit: Payer: Self-pay

## 2022-02-23 ENCOUNTER — Inpatient Hospital Stay (HOSPITAL_COMMUNITY): Payer: Medicare Other

## 2022-02-23 DIAGNOSIS — Z981 Arthrodesis status: Secondary | ICD-10-CM

## 2022-02-23 DIAGNOSIS — D649 Anemia, unspecified: Secondary | ICD-10-CM | POA: Diagnosis present

## 2022-02-23 DIAGNOSIS — N186 End stage renal disease: Secondary | ICD-10-CM | POA: Diagnosis present

## 2022-02-23 DIAGNOSIS — I639 Cerebral infarction, unspecified: Secondary | ICD-10-CM | POA: Diagnosis not present

## 2022-02-23 DIAGNOSIS — E869 Volume depletion, unspecified: Secondary | ICD-10-CM | POA: Diagnosis present

## 2022-02-23 DIAGNOSIS — Z681 Body mass index (BMI) 19 or less, adult: Secondary | ICD-10-CM | POA: Diagnosis not present

## 2022-02-23 DIAGNOSIS — M898X9 Other specified disorders of bone, unspecified site: Secondary | ICD-10-CM | POA: Diagnosis present

## 2022-02-23 DIAGNOSIS — I63412 Cerebral infarction due to embolism of left middle cerebral artery: Secondary | ICD-10-CM

## 2022-02-23 DIAGNOSIS — I10 Essential (primary) hypertension: Secondary | ICD-10-CM | POA: Diagnosis not present

## 2022-02-23 DIAGNOSIS — I63312 Cerebral infarction due to thrombosis of left middle cerebral artery: Secondary | ICD-10-CM

## 2022-02-23 DIAGNOSIS — Z87442 Personal history of urinary calculi: Secondary | ICD-10-CM

## 2022-02-23 DIAGNOSIS — I700 Atherosclerosis of aorta: Secondary | ICD-10-CM | POA: Diagnosis present

## 2022-02-23 DIAGNOSIS — G4700 Insomnia, unspecified: Secondary | ICD-10-CM | POA: Diagnosis present

## 2022-02-23 DIAGNOSIS — E875 Hyperkalemia: Secondary | ICD-10-CM | POA: Diagnosis present

## 2022-02-23 DIAGNOSIS — R1310 Dysphagia, unspecified: Secondary | ICD-10-CM | POA: Diagnosis present

## 2022-02-23 DIAGNOSIS — Z853 Personal history of malignant neoplasm of breast: Secondary | ICD-10-CM

## 2022-02-23 DIAGNOSIS — Z7982 Long term (current) use of aspirin: Secondary | ICD-10-CM

## 2022-02-23 DIAGNOSIS — F17210 Nicotine dependence, cigarettes, uncomplicated: Secondary | ICD-10-CM | POA: Diagnosis present

## 2022-02-23 DIAGNOSIS — N2581 Secondary hyperparathyroidism of renal origin: Secondary | ICD-10-CM | POA: Diagnosis present

## 2022-02-23 DIAGNOSIS — K219 Gastro-esophageal reflux disease without esophagitis: Secondary | ICD-10-CM | POA: Diagnosis present

## 2022-02-23 DIAGNOSIS — I5022 Chronic systolic (congestive) heart failure: Secondary | ICD-10-CM | POA: Diagnosis present

## 2022-02-23 DIAGNOSIS — Z992 Dependence on renal dialysis: Secondary | ICD-10-CM

## 2022-02-23 DIAGNOSIS — I69351 Hemiplegia and hemiparesis following cerebral infarction affecting right dominant side: Secondary | ICD-10-CM | POA: Diagnosis not present

## 2022-02-23 DIAGNOSIS — I161 Hypertensive emergency: Secondary | ICD-10-CM | POA: Diagnosis present

## 2022-02-23 DIAGNOSIS — E440 Moderate protein-calorie malnutrition: Secondary | ICD-10-CM | POA: Diagnosis present

## 2022-02-23 DIAGNOSIS — G8321 Monoplegia of upper limb affecting right dominant side: Secondary | ICD-10-CM | POA: Diagnosis present

## 2022-02-23 DIAGNOSIS — R29810 Facial weakness: Secondary | ICD-10-CM | POA: Diagnosis present

## 2022-02-23 DIAGNOSIS — R29708 NIHSS score 8: Secondary | ICD-10-CM | POA: Diagnosis present

## 2022-02-23 DIAGNOSIS — J449 Chronic obstructive pulmonary disease, unspecified: Secondary | ICD-10-CM | POA: Diagnosis present

## 2022-02-23 DIAGNOSIS — Z66 Do not resuscitate: Secondary | ICD-10-CM | POA: Diagnosis present

## 2022-02-23 DIAGNOSIS — I132 Hypertensive heart and chronic kidney disease with heart failure and with stage 5 chronic kidney disease, or end stage renal disease: Secondary | ICD-10-CM | POA: Diagnosis present

## 2022-02-23 DIAGNOSIS — R29898 Other symptoms and signs involving the musculoskeletal system: Principal | ICD-10-CM

## 2022-02-23 DIAGNOSIS — Z79899 Other long term (current) drug therapy: Secondary | ICD-10-CM

## 2022-02-23 DIAGNOSIS — I480 Paroxysmal atrial fibrillation: Secondary | ICD-10-CM | POA: Diagnosis present

## 2022-02-23 DIAGNOSIS — I6389 Other cerebral infarction: Principal | ICD-10-CM | POA: Diagnosis present

## 2022-02-23 DIAGNOSIS — Z91013 Allergy to seafood: Secondary | ICD-10-CM

## 2022-02-23 DIAGNOSIS — R471 Dysarthria and anarthria: Secondary | ICD-10-CM | POA: Diagnosis present

## 2022-02-23 DIAGNOSIS — E785 Hyperlipidemia, unspecified: Secondary | ICD-10-CM | POA: Diagnosis present

## 2022-02-23 DIAGNOSIS — R079 Chest pain, unspecified: Secondary | ICD-10-CM | POA: Diagnosis not present

## 2022-02-23 DIAGNOSIS — Z9012 Acquired absence of left breast and nipple: Secondary | ICD-10-CM

## 2022-02-23 DIAGNOSIS — Z888 Allergy status to other drugs, medicaments and biological substances status: Secondary | ICD-10-CM

## 2022-02-23 DIAGNOSIS — Z7901 Long term (current) use of anticoagulants: Secondary | ICD-10-CM

## 2022-02-23 DIAGNOSIS — Z8249 Family history of ischemic heart disease and other diseases of the circulatory system: Secondary | ICD-10-CM

## 2022-02-23 HISTORY — DX: Cerebral infarction, unspecified: I63.9

## 2022-02-23 LAB — TROPONIN I (HIGH SENSITIVITY)
Troponin I (High Sensitivity): 153 ng/L (ref <18)
Troponin I (High Sensitivity): 147 ng/L (ref <18)

## 2022-02-23 LAB — PROTIME-INR
INR: 1 (ref 0.8–1.2)
Prothrombin Time: 13.5 seconds (ref 11.4–15.2)

## 2022-02-23 LAB — CBC WITH DIFFERENTIAL/PLATELET
Abs Immature Granulocytes: 0.01 10*3/uL (ref 0.00–0.07)
Basophils Absolute: 0.1 10*3/uL (ref 0.0–0.1)
Basophils Relative: 1 %
Eosinophils Absolute: 0.2 10*3/uL (ref 0.0–0.5)
Eosinophils Relative: 4 %
HCT: 38.7 % (ref 36.0–46.0)
Hemoglobin: 11.8 g/dL — ABNORMAL LOW (ref 12.0–15.0)
Immature Granulocytes: 0 %
Lymphocytes Relative: 23 %
Lymphs Abs: 1 10*3/uL (ref 0.7–4.0)
MCH: 31 pg (ref 26.0–34.0)
MCHC: 30.5 g/dL (ref 30.0–36.0)
MCV: 101.6 fL — ABNORMAL HIGH (ref 80.0–100.0)
Monocytes Absolute: 0.4 10*3/uL (ref 0.1–1.0)
Monocytes Relative: 9 %
Neutro Abs: 2.8 10*3/uL (ref 1.7–7.7)
Neutrophils Relative %: 63 %
Platelets: 284 10*3/uL (ref 150–400)
RBC: 3.81 MIL/uL — ABNORMAL LOW (ref 3.87–5.11)
RDW: 16.3 % — ABNORMAL HIGH (ref 11.5–15.5)
WBC: 4.4 10*3/uL (ref 4.0–10.5)
nRBC: 0 % (ref 0.0–0.2)

## 2022-02-23 LAB — COMPREHENSIVE METABOLIC PANEL
ALT: 12 U/L (ref 0–44)
AST: 44 U/L — ABNORMAL HIGH (ref 15–41)
Albumin: 4.1 g/dL (ref 3.5–5.0)
Alkaline Phosphatase: 78 U/L (ref 38–126)
Anion gap: 13 (ref 5–15)
BUN: 42 mg/dL — ABNORMAL HIGH (ref 8–23)
CO2: 26 mmol/L (ref 22–32)
Calcium: 10.5 mg/dL — ABNORMAL HIGH (ref 8.9–10.3)
Chloride: 102 mmol/L (ref 98–111)
Creatinine, Ser: 8.96 mg/dL — ABNORMAL HIGH (ref 0.44–1.00)
GFR, Estimated: 4 mL/min — ABNORMAL LOW (ref >60)
Glucose, Bld: 93 mg/dL (ref 70–99)
Potassium: 5.9 mmol/L — ABNORMAL HIGH (ref 3.5–5.1)
Sodium: 141 mmol/L (ref 135–145)
Total Bilirubin: 0.6 mg/dL (ref 0.3–1.2)
Total Protein: 7.5 g/dL (ref 6.5–8.1)

## 2022-02-23 LAB — POTASSIUM: Potassium: 4 mmol/L (ref 3.5–5.1)

## 2022-02-23 LAB — HEMOGLOBIN A1C
Hgb A1c MFr Bld: 4.7 % — ABNORMAL LOW (ref 4.8–5.6)
Mean Plasma Glucose: 88.19 mg/dL

## 2022-02-23 LAB — BRAIN NATRIURETIC PEPTIDE: B Natriuretic Peptide: >4500 pg/mL — ABNORMAL HIGH (ref 0.0–100.0)

## 2022-02-23 LAB — CBG MONITORING, ED
Glucose-Capillary: 106 mg/dL — ABNORMAL HIGH (ref 70–99)
Glucose-Capillary: 103 mg/dL — ABNORMAL HIGH (ref 70–99)

## 2022-02-23 LAB — APTT: aPTT: 31 seconds (ref 24–36)

## 2022-02-23 MED ORDER — LABETALOL HCL 5 MG/ML IV SOLN
10.0000 mg | Freq: Once | INTRAVENOUS | Status: AC
  Administered 2022-02-23: 10 mg via INTRAVENOUS

## 2022-02-23 MED ORDER — ASPIRIN 300 MG RE SUPP
300.0000 mg | Freq: Once | RECTAL | Status: DC
  Filled 2022-02-23: qty 1

## 2022-02-23 MED ORDER — LORAZEPAM 2 MG/ML IJ SOLN
0.5000 mg | INTRAMUSCULAR | Status: DC | PRN
  Administered 2022-02-25: 0.5 mg via INTRAVENOUS
  Filled 2022-02-23: qty 1

## 2022-02-23 MED ORDER — HEPARIN SODIUM (PORCINE) 5000 UNIT/ML IJ SOLN
5000.0000 [IU] | Freq: Two times a day (BID) | INTRAMUSCULAR | Status: DC
  Administered 2022-02-24: 5000 [IU] via SUBCUTANEOUS
  Filled 2022-02-23: qty 1

## 2022-02-23 MED ORDER — SODIUM CHLORIDE 0.9 % IV SOLN: INTRAVENOUS | Status: DC

## 2022-02-23 MED ORDER — STROKE: EARLY STAGES OF RECOVERY BOOK
Freq: Once | Status: AC
  Filled 2022-02-23: qty 1

## 2022-02-23 MED ORDER — LABETALOL HCL 5 MG/ML IV SOLN
20.0000 mg | INTRAVENOUS | Status: DC | PRN
  Administered 2022-02-23: 20 mg via INTRAVENOUS
  Filled 2022-02-23: qty 4

## 2022-02-23 MED ORDER — ACETAMINOPHEN 650 MG RE SUPP: 650.0000 mg | RECTAL | Status: DC | PRN

## 2022-02-23 MED ORDER — ALBUTEROL SULFATE (2.5 MG/3ML) 0.083% IN NEBU: 2.5000 mg | INHALATION_SOLUTION | Freq: Four times a day (QID) | RESPIRATORY_TRACT | Status: DC | PRN

## 2022-02-23 MED ORDER — SODIUM ZIRCONIUM CYCLOSILICATE 10 G PO PACK
10.0000 g | PACK | Freq: Once | ORAL | Status: AC
  Administered 2022-02-23: 10 g via ORAL
  Filled 2022-02-23: qty 1

## 2022-02-23 MED ORDER — ALBUTEROL SULFATE HFA 108 (90 BASE) MCG/ACT IN AERS: 2.0000 | INHALATION_SPRAY | Freq: Four times a day (QID) | RESPIRATORY_TRACT | Status: DC | PRN

## 2022-02-23 MED ORDER — ACETAMINOPHEN 160 MG/5ML PO SOLN: 650.0000 mg | ORAL | Status: DC | PRN

## 2022-02-23 MED ORDER — ACETAMINOPHEN 325 MG PO TABS
650.0000 mg | ORAL_TABLET | ORAL | Status: DC | PRN
  Administered 2022-02-23 – 2022-02-25 (×4): 650 mg via ORAL
  Filled 2022-02-23 (×4): qty 2

## 2022-02-23 MED ORDER — CLONIDINE HCL 0.1 MG/24HR TD PTWK: 0.1000 mg | MEDICATED_PATCH | TRANSDERMAL | Status: DC

## 2022-02-23 NOTE — Progress Notes (Signed)
IV team consult placed stat for RN unable to obtain IV access.

## 2022-02-23 NOTE — ED Notes (Signed)
Transport request is in

## 2022-02-23 NOTE — ED Triage Notes (Signed)
Pt arrived via [facility] from home. Pt went to bed at 9pm on the 23rd, woke up the 24th with right sided deficits (facial droop, slurred speech, right sided weakness). She is a dialysis pt, and last went on Saturday.   The family of the pt called EMS because her symptoms did not improve from yesterday morning. Pt's right hand is swollen and in pain. EMS was unsuccessful with IV access  LVS  207/132 HR 85  98% RA  18RR

## 2022-02-23 NOTE — H&P (Signed)
History and Physical    [name] CZY:[id number] DOB: [birth year] DOA: 02/23/2022  PCP: Pcp, No (Confirm with patient/family/NH records and if not entered, this has to be entered at [facility] point of entry) Patient coming from: Home  I have personally briefly reviewed patient's old medical records in [place]  Chief Complaint: Stroke like symptoms  HPI: Monique Henderson is a 71 y.o. female with medical history significant of ESRD on HD TTS, PAF on Eliquis, aortic dissection status postrepair, HFrEF, refractory HTN, presented with strokelike symptoms.  Symptoms started yesterday evening around 9:00, patient suddenly developed right-sided facial droop and right arm weakness.  She chose to not come in the hospital and went to bed.  She also noticed that when she tried to eat or drink something , she started to cough right after.  As result, she did not eat or drink anything since yesterday evening.  This morning, patient woke up with persistent right-sided facial droop and right arm weakness.  She also developed slurred speech when talking to her sister over the phone.  She claimed that she is been taking her blood pressure medications and Eliquis every day, last time she took Eliquis was yesterday evening ED Course: Blood pressure significant elevated SBP> 200, DBP> 130, CT head negative for acute findings.  Blood work K5.9, no significant ST changes.  Review of Systems: As per HPI otherwise 14 point review of systems negative.   Past Medical History:  Diagnosis Date   AF (paroxysmal atrial fibrillation) ([place]) 05/29/2019   on Coumadin   Aortic atherosclerosis ([place]) 07/05/2019   Aortic dissection ([place]) 04/04/2019   s/p repair   Bone spur 2008   Right calcaneal foot spur   Breast cancer ([place]) 2004   Ductal carcinoma in situ of the left breast; S/P left partial mastectomy 02/26/2003; S/P re-excision of cranial and lateral margins11/18/2004.radiation   Cerebral thrombosis with  cerebral infarction 05/22/2019   Chronic low back pain 06/22/2016   Chronic obstructive lung disease ([place]) 01/16/2017   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 Autumn Messing; S/P re-excision of superior and inferior margins 10/27/2012.    ESRD on hemodialysis ([place]) 05/29/2019   Essential hypertension 09/16/2006   GERD 09/16/2006   Hepatitis C    treated and RNA confirmed not detectable 01/2017   Insomnia 03/14/2015   Malnutrition of moderate degree 05/19/2019   Non compliance with medical treatment 12/04/2017   Normocytic anemia    With thrombocytosis   Osteoarthritis    Right ureteral stone 2002   S/P lumbar spinal fusion 01/18/2014   S/P lumbar decompressive laminectomy, fusion, and plating for lumbar spinal stenosis on 05/27/2009 by Dr. Eustace Moore.  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 Eustace Moore.     Tobacco use disorder 04/19/2009   Uterine fibroid    Wears dentures    top    Past Surgical History:  Procedure Laterality Date   ANTERIOR LAT LUMBAR FUSION N/A 01/18/2014   Procedure: ANTERIOR LATERAL LUMBAR FUSION LUMBAR TWO-THREE;  Surgeon: Eustace Moore, MD;  Location: [facility];  Service: Neurosurgery;  Laterality: N/A;   ANTERIOR LUMBAR FUSION  01/18/2014   AV FISTULA PLACEMENT Left 04/20/2019   Procedure: ARTERIOVENOUS (AV) FISTULA CREATION;  Surgeon: [name],  MD;  Location: [place];  Service: Vascular;  Laterality: Left;   BACK SURGERY     BREAST LUMPECTOMY Left 01/2003   BREAST LUMPECTOMY Right 2014   BREAST LUMPECTOMY WITH NEEDLE LOCALIZATION AND AXILLARY SENTINEL LYMPH NODE BX Right 10/13/2012   Procedure: BREAST LUMPECTOMY WITH NEEDLE LOCALIZATION;  Surgeon: [name], MD;  Location: [place];  Service: General;  Laterality: Right;  Right breast wire localized lumpectomy   INSERTION OF DIALYSIS CATHETER Right 04/20/2019   Procedure: INSERTION OF DIALYSIS CATHETER, right internal jugular;  Surgeon: [name], MD;  Location: [place];  Service: Vascular;  Laterality: Right;   INSERTION OF DIALYSIS CATHETER Right 09/24/2020   Procedure: INSERTION OF TUNNELED DIALYSIS CATHETER;  Surgeon: Rosetta Posner, MD;  Location: [place];  Service: Vascular;  Laterality: Right;   IR FLUORO GUIDE CV LINE RIGHT  09/22/2020   IR THORACENTESIS ASP PLEURAL SPACE W/IMG GUIDE  05/19/2019   IR US GUIDE VASC ACCESS LEFT  09/22/2020   IR US GUIDE VASC ACCESS RIGHT  09/22/2020   IR VENOCAVAGRAM SVC  09/22/2020   LAMINECTOMY  05/27/2009   Lumbar decompressive laminectomy, fusion and plating for lumbar spinal stensosis   LIGATION OF ARTERIOVENOUS  FISTULA Left 09/24/2020   Procedure: LIGATION OF LEFT ARM ARTERIOVENOUS  FISTULA;  Surgeon: Rosetta Posner, MD;  Location: [place];  Service: Vascular;  Laterality: Left;   LUMBAR LAMINECTOMY/DECOMPRESSION MICRODISCECTOMY Left 03/23/2013   Procedure: LUMBAR LAMINECTOMY/DECOMPRESSION MICRODISCECTOMY 1 LEVEL;  Surgeon: Eustace Moore, MD;  Location: [facility];  Service: Neurosurgery;  Laterality: Left;  LUMBAR LAMINECTOMY/DECOMPRESSION MICRODISCECTOMY 1 LEVEL   MASTECTOMY, PARTIAL Left 02/26/2003   ; S/P re-excision of cranial and lateral margins 04/19/2003.    RE-EXCISION OF BREAST CANCER,SUPERIOR MARGINS Right 10/27/2012   Procedure: RE-EXCISION OF BREAST CANCER,SUPERIOR and inferior MARGINS;  Surgeon: [name], MD;  Location: [place];  Service: General;  Laterality: Right;   RE-EXCISION OF BREAST LUMPECTOMY Left 04/2003   TEE WITHOUT CARDIOVERSION N/A 04/04/2019   Procedure: Transesophageal Echocardiogram (Tee);  Surgeon: Wonda Olds, MD;  Location: [facility];  Service: Open Heart Surgery;  Laterality: N/A;   THORACIC AORTIC ANEURYSM  REPAIR N/A 04/04/2019   Procedure: THORACIC ASCENDING ANEURYSM REPAIR (AAA)  USING 28 MM X 30 CM HEMASHIELD PLATINUM VASCULAR GRAFT;  Surgeon: Wonda Olds, MD;  Location: [facility];  Service: Open Heart Surgery;  Laterality: N/A;     reports that she has been smoking cigarettes. She has a 11.00 pack-year smoking history. She has never used smokeless tobacco. She reports that she does not currently use alcohol after a past usage of about 2.0 standard drinks of alcohol per week. She reports that she does not currently use drugs after having used the following drugs: Cocaine.  Allergies  Allergen Reactions   Shrimp [Shellfish Allergy] Shortness Of Breath   Bactroban [Mupirocin] Other (See Comments)    "Sores in nose"   Tylenol [Acetaminophen] Itching   Zestril [Lisinopril] Cough    Family History  Problem Relation Age of Onset   Colon cancer Mother 21   Hypertension Mother    Diabetes Sister 27   Hypertension Sister    Diabetes Brother    Hypertension Brother    Diabetes Brother    Hypertension Brother    Kidney disease Son        s/p renal transplant   Hypertension Son    Diabetes Son    Multiple sclerosis  Son    Bone cancer Sister 2   Breast cancer Neg Hx    Cervical cancer Neg Hx      Prior to Admission medications   Medication Sig Start Date End Date Taking? Authorizing Provider  albuterol (VENTOLIN HFA) 108 (90 Base) MCG/ACT inhaler Inhale 2 puffs into the lungs every 6 (six) hours as needed for wheezing or shortness of breath. 07/30/20   Maximiano Coss, NP  amLODipine (NORVASC) 10 MG tablet Take 1 tablet (10 mg total) by mouth daily. 01/10/22   Charlynne Cousins, MD  apixaban (ELIQUIS) 2.5 MG TABS tablet Take 1 tablet (2.5 mg total) by mouth 2 (two) times daily. 08/24/21   Shary Key, DO  carvedilol (COREG) 6.25 MG tablet Take 1 tablet (6.25 mg total) by mouth 2 (two) times daily with a meal. 01/10/22   Charlynne Cousins, MD  Cholecalciferol (VITAMIN D-3 PO)  Take 1 capsule by mouth daily.    [provider]  cloNIDine (CATAPRES - DOSED IN MG/24 HR) 0.2 mg/24hr patch Place 1 patch (0.2 mg total) onto the skin every Saturday. 01/10/22   Charlynne Cousins, MD  hydrALAZINE (APRESOLINE) 50 MG tablet Take 1 tablet (50 mg total) by mouth 3 (three) times daily. 01/10/22 02/09/22  Charlynne Cousins, MD  isosorbide mononitrate (IMDUR) 30 MG 24 hr tablet Take 2 tablets (60 mg total) by mouth daily. Patient taking differently: Take 30 mg by mouth daily. 01/10/22   Charlynne Cousins, MD  losartan (COZAAR) 100 MG tablet Take 1 tablet (100 mg total) by mouth daily. 02/14/21   Maximiano Coss, NP  mirtazapine (REMERON) 7.5 MG tablet Take 1 tablet (7.5 mg total) by mouth at bedtime. Patient taking differently: Take 7.5 mg by mouth at bedtime as needed (sleep). 07/15/20   Maximiano Coss, NP  rosuvastatin (CRESTOR) 10 MG tablet Take 10 mg by mouth daily.    [provider]  sevelamer carbonate (RENVELA) 2.4 g PACK 2.4 g with breakfast, with lunch, and with evening meal.    [provider]  torsemide 40 MG TABS Take 40 mg by mouth 2 (two) times daily. 01/10/22   Charlynne Cousins, MD    Physical Exam: Vitals:   02/23/22 1348 02/23/22 1500 02/23/22 1515 02/23/22 1530  BP: (!) 207/153 (!) 215/123 (!) 202/119 (!) 204/119  Pulse: 87     Resp: 18 (!) 25 (!) 22 20  Temp:      SpO2: 97%       Constitutional: NAD, calm, comfortable Vitals:   02/23/22 1348 02/23/22 1500 02/23/22 1515 02/23/22 1530  BP: (!) 207/153 (!) 215/123 (!) 202/119 (!) 204/119  Pulse: 87     Resp: 18 (!) 25 (!) 22 20  Temp:      SpO2: 97%      Eyes: PERRL, lids and conjunctivae normal ENMT: Mucous membranes are moist. Posterior pharynx clear of any exudate or lesions.Normal dentition.  Neck: normal, supple, no masses, no thyromegaly Respiratory: clear to auscultation bilaterally, no wheezing, no crackles. Normal respiratory effort. No accessory muscle use.   Cardiovascular: Regular rate and rhythm, no murmurs / rubs / gallops. No extremity edema. 2+ pedal pulses. No carotid bruits.  Abdomen: no tenderness, no masses palpated. No hepatosplenomegaly. Bowel sounds positive.  Musculoskeletal: no clubbing / cyanosis. No joint deformity upper and lower extremities. Good ROM, no contractures. Normal muscle tone.  Skin: no rashes, lesions, ulcers. No induration Neurologic: Right-sided facial droop and tongue deviation. Sensation intact, DTR normal.  Muscle strength 3/5 on right upper arm and forearm and hand compared to 5/5 on left side.  Psychiatric: Normal judgment and insight. Alert and oriented x 3. Normal mood.     Labs on Admission: I have personally reviewed following labs and imaging studies  CBC: No results for input(s): "WBC", "NEUTROABS", "HGB", "HCT", "MCV", "PLT" in the last 168 hours. Basic Metabolic Panel: Recent Labs  Lab 02/23/22 1444  NA 141  K 5.9*  CL 102  CO2 26  GLUCOSE 93  BUN 42*  CREATININE 8.96*  CALCIUM 10.5*   GFR: CrCl cannot be calculated (Unknown ideal weight.). Liver Function Tests: Recent Labs  Lab 02/23/22 1444  AST 44*  ALT 12  ALKPHOS 78  BILITOT 0.6  PROT 7.5  ALBUMIN 4.1   No results for input(s): "LIPASE", "AMYLASE" in the last 168 hours. No results for input(s): "AMMONIA" in the last 168 hours. Coagulation Profile: Recent Labs  Lab 02/23/22 1444  INR 1.0   Cardiac Enzymes: No results for input(s): "CKTOTAL", "CKMB", "CKMBINDEX", "TROPONINI" in the last 168 hours. BNP (last 3 results) No results for input(s): "PROBNP" in the last 8760 hours. HbA1C: No results for input(s): "HGBA1C" in the last 72 hours. CBG: Recent Labs  Lab 02/23/22 1357  GLUCAP 106*   Lipid Profile: No results for input(s): "CHOL", "HDL", "LDLCALC", "TRIG", "CHOLHDL", "LDLDIRECT" in the last 72 hours. Thyroid Function Tests: No results for input(s): "TSH", "T4TOTAL", "FREET4", "T3FREE", "THYROIDAB" in the  last 72 hours. Anemia Panel: No results for input(s): "VITAMINB12", "FOLATE", "FERRITIN", "TIBC", "IRON", "RETICCTPCT" in the last 72 hours. Urine analysis:    Component Value Date/Time   COLORURINE AMBER (A) 12/27/2021 1840   APPEARANCEUR TURBID (A) 12/27/2021 1840   LABSPEC 1.013 12/27/2021 1840   PHURINE 8.0 12/27/2021 1840   GLUCOSEU NEGATIVE 12/27/2021 1840   GLUCOSEU NEG mg/dL 10/30/2009 1944   HGBUR MODERATE (A) 12/27/2021 1840   BILIRUBINUR NEGATIVE 12/27/2021 1840   BILIRUBINUR negative 05/04/2018 0910   KETONESUR NEGATIVE 12/27/2021 1840   PROTEINUR >=300 (A) 12/27/2021 1840   UROBILINOGEN 0.2 05/04/2018 0910   UROBILINOGEN 1 02/14/2014 0946   NITRITE NEGATIVE 12/27/2021 1840   LEUKOCYTESUR LARGE (A) 12/27/2021 1840    Radiological Exams on Admission: DG Chest Portable 1 View  Result Date: 02/23/2022 CLINICAL DATA:  Stroke EXAM: PORTABLE CHEST 1 VIEW COMPARISON:  Radiograph 01/29/2022 FINDINGS: Unchanged enlarged cardiomediastinal silhouette with prior median sternotomy. Right neck catheter tip overlies the right atrium. There is no focal airspace consolidation. There is no pleural effusion. No pneumothorax. No acute osseous abnormality. IMPRESSION: Unchanged cardiomegaly.  No new airspace disease. Electronically Signed   By: Maurine Simmering M.D.   On: 02/23/2022 15:06   CT Head Wo Contrast  Result Date: 02/23/2022 CLINICAL DATA:  RUE weakness, right facial droop, dysarthria EXAM: CT HEAD WITHOUT CONTRAST TECHNIQUE: Contiguous axial images were obtained from the base of the skull through the vertex without intravenous contrast. RADIATION DOSE REDUCTION: This exam was performed according to the departmental dose-optimization program which includes automated exposure control, adjustment of the mA and/or kV according to patient size and/or use of iterative reconstruction technique. COMPARISON:  CT head 12/24/2021. FINDINGS: Brain: Subtle high left frontoparietal hypoattenuation and  possible loss of gray differentiation (for example see series 2, image 19 and series 5, image 33). No evidence of acute hemorrhage, mass lesion midline shift or hydrocephalus. Patchy white matter hypoattenuation, nonspecific but compatible with chronic microvascular disease. Vascular: Calcific atherosclerosis. No convincing hyperdense vessel identified. Skull:  No acute fracture. Sinuses/Orbits: Clear sinuses.  No acute orbital findings. Other: No mastoid effusions. Dr. Leonie Man paged at the time of dictation for call of report. IMPRESSION: 1. Subtle high left frontoparietal hypoattenuation and possible loss of gray differentiation, which could represent acute infarct (particularly given the clinical history) or artifact. Recommend MRI. 2. No acute hemorrhage. Electronically Signed   By: Margaretha Sheffield M.D.   On: 02/23/2022 14:32    EKG: Independently reviewed.  Normal sinus rhythm, chronic RBBB, no acute ST changes.  Assessment/Plan Principal Problem:   Stroke [facility]) Active Problems:   Stroke (cerebrum) ([place])  (please populate well all problems here in Problem List. (For example, if patient is on BP meds at home and you resume or decide to hold them, it is a problem that needs to be her. Same for CAD, COPD, HLD and so on)  Right upper extremity paresis and right-sided facial droop and dysarthria -Acute stroke -Brain MRI/MRA without contrast -Not a candidate for TNK due to out of window and has been on anticoagulation within 24 hours -Allow permissive hypertension, labetalol for 200/110 -N.p.o. for now given the significant tongue deviation and slurred speech highly suspect dysphagia as well.  HTN emergency -As needed labetalol as above, allow permissive times for 48 hours -Resume clonidine patch -Hold other home BP meds including Imdur, hydralazine, losartan, torsemide and amlodipine.  Hyperkalemia -No significant T wave changes, Lokelma x1, repeat K level tonight, BMP tomorrow and routine HD  tomorrow.  ESRD on HD -Last dialysis last Saturday, next session tomorrow/Tuesday.  Right now, patient is volume depleted, mild hyperkalemia we will treat medically.  No indication for emergency dialysis.  Contact nephrology for routine dialysis tomorrow.  Chronic HFrEF -Volume depleted secondary to poor intake since onset of stroke symptoms -IV fluid as above, chest x-ray tomorrow, hold off torsemide  PAF -Decided to hold off Eliquis for now until MRI rule out a massive stroke -Chemical DVT prophylaxis  History of cocaine abuse -Denied recent cocaine use.  Check UDS  DVT prophylaxis: Heparin subcu Code Status: DNR Family Communication: Sister at bedside Disposition Plan: Patient sick with significant neurological deficit and new stroke, requiring inpatient management and expect discharge to rehab. Consults called: Neurology Admission status: Telemetry admission   Lequita Halt MD [facility] Pager [phone]  02/23/2022, 4:58 PM

## 2022-02-23 NOTE — ED Provider Notes (Signed)
Blood pressure (!) 215/123, pulse 87, temperature 98.5 F (36.9 C), resp. rate (!) 25, SpO2 97 %.  Assuming care from Dr. Nechama Guard.  In short, Monique Henderson is a 71 y.o. female with a chief complaint of LVO+/Code Stroke .  Refer to the original H&P for additional details.  The current plan of care is to follow up on labs and admit with subacute infarct. No TNK.  Discussed patient's case with [facility] to request admission. Patient and family (if present) updated with plan. Care transferred to [facility] service.  I reviewed all nursing notes, vitals, pertinent old records, EKGs, labs, imaging (as available).     Margette Fast, MD 02/23/22 [id number]

## 2022-02-23 NOTE — ED Notes (Signed)
Pt needed applesauce to get pills down.

## 2022-02-23 NOTE — ED Notes (Signed)
Pt son walked out of room, I asked could I help him with something she stated that his mother needed to go to the bathroom.  I stated that I needed to go do something for one of my pt. And that no one was available I would assist him.  He began getting angry and stated that he called out 4 times and no one went into the room..  I asked if mother ambulated and he got angry and stated and "no my mother just had a stroke,"  I apologized stating that I was sorry I did not know what his mother was here for and I was asking so I could help. He started to get angry and asking for a pen, my name, and a blank paper to write my name down.  Asked him why he wanted to write my name down.  He said.. "why can't I write your name down."  I just stated that I was wondering why he wanted to write my name down.  I gave him my name and assisted in finding someone to help place her on a pure wick

## 2022-02-23 NOTE — Code Documentation (Addendum)
Corliss Coggeshall is a 71 yr old female with PMH of ESRD on dialysis and AF. She is on Eliquis. She was in her usual state of health when she went to bed Sat night, and awoke with dysarthria and rt arm weakness Sunday. Code stroke was activated by EDP [place]. Pt was met in CT by stroke team. CTNC neg for acute abnormality per Dr Leonie Man. NIHSS 7, please see documentation for times and details. Pt with dysarthria, rt droop, and rt arm is flaccid. Initially the LKW was thought to be Last night at 2100, but further clarifiaction reveals her LKW was Saturday night at 2100. Pt is therefore OOW for NIR. Clinical exam is LVO negative. Pt OOW for thrombolysis. Pt returned to room 20 in ED. IV team paged Stat as pt needs labetelol, and RN staff was unable to start PIV. Pt will need q 2 hr VS and NIHSS. Bedside handoff with EDRN Vonna Kotyk complete.

## 2022-02-23 NOTE — ED Notes (Signed)
[facility] (in contacts) requests to be only one notified about care and updates and he will disseminate information.

## 2022-02-23 NOTE — ED Provider Notes (Signed)
[facility] Provider Note   CSN: [id number] Arrival date & time: 02/23/22  1322     History  Chief Complaint  Patient presents with   LVO+/Code Stroke    [facility] is a 71 y.o. female.  With PMH of ESRD on HD Tuesday Thursday Saturday last dialysis this past Saturday, HTN, HFrEF, HLD, previous CVA with no residual deficits per patient who presented with stroke symptoms including right upper extremity weakness and numbness, right facial droop and dysarthria.  Last known normal per patient was yesterday 02/22/2022 at 9 PM when she went to bed.  She said she woke up with the symptoms.  She denies any recent falls or injuries.  She is on Eliquis.  She denies any chest pain, no shortness of breath, no headache or recent falls or injuries.  HPI     Home Medications Prior to Admission medications   Medication Sig Start Date End Date Taking? Authorizing Provider  albuterol (VENTOLIN HFA) 108 (90 Base) MCG/ACT inhaler Inhale 2 puffs into the lungs every 6 (six) hours as needed for wheezing or shortness of breath. 07/30/20   Maximiano Coss, NP  amLODipine (NORVASC) 10 MG tablet Take 1 tablet (10 mg total) by mouth daily. 01/10/22   Charlynne Cousins, MD  apixaban (ELIQUIS) 2.5 MG TABS tablet Take 1 tablet (2.5 mg total) by mouth 2 (two) times daily. 08/24/21   Shary Key, DO  carvedilol (COREG) 6.25 MG tablet Take 1 tablet (6.25 mg total) by mouth 2 (two) times daily with a meal. 01/10/22   Charlynne Cousins, MD  Cholecalciferol (VITAMIN D-3 PO) Take 1 capsule by mouth daily.    [provider]  cloNIDine (CATAPRES - DOSED IN MG/24 HR) 0.2 mg/24hr patch Place 1 patch (0.2 mg total) onto the skin every Saturday. 01/10/22   Charlynne Cousins, MD  hydrALAZINE (APRESOLINE) 50 MG tablet Take 1 tablet (50 mg total) by mouth 3 (three) times daily. 01/10/22 02/09/22  Charlynne Cousins, MD  isosorbide mononitrate (IMDUR) 30 MG 24 hr  tablet Take 2 tablets (60 mg total) by mouth daily. Patient taking differently: Take 30 mg by mouth daily. 01/10/22   Charlynne Cousins, MD  losartan (COZAAR) 100 MG tablet Take 1 tablet (100 mg total) by mouth daily. 02/14/21   Maximiano Coss, NP  mirtazapine (REMERON) 7.5 MG tablet Take 1 tablet (7.5 mg total) by mouth at bedtime. Patient taking differently: Take 7.5 mg by mouth at bedtime as needed (sleep). 07/15/20   Maximiano Coss, NP  rosuvastatin (CRESTOR) 10 MG tablet Take 10 mg by mouth daily.    [provider]  sevelamer carbonate (RENVELA) 2.4 g PACK 2.4 g with breakfast, with lunch, and with evening meal.    [provider]  torsemide 40 MG TABS Take 40 mg by mouth 2 (two) times daily. 01/10/22   Charlynne Cousins, MD      Allergies    Shrimp [shellfish allergy], Bactroban [mupirocin], Tylenol [acetaminophen], and Zestril [lisinopril]    Review of Systems   Review of Systems  Physical Exam Updated Vital Signs BP (!) 207/153 (BP Location: Right Arm)   Pulse 87   Temp 98.5 F (36.9 C)   Resp 18   SpO2 97%  Physical Exam Constitutional: Alert and oriented.  Chronically ill-appearing but no acute distress Eyes: Conjunctivae are normal. ENT      Head: Normocephalic and atraumatic.      Nose: No congestion.  Mouth/Throat: Mucous membranes are moist.      Neck: No stridor. Cardiovascular: S1, S2, equal distal and proximal pulses regular rate  respiratory: Normal respiratory effort.  Satting appropriately on room air Gastrointestinal: Soft and nontender.  Musculoskeletal: Normal range of motion in all extremities. No edema of the lower extremities Neurologic: Severe dysarthria.  No expressive or receptive aphasia.  Full strength of the left upper and lower extremity.  Full strength of right lower extremity.  No movement of right upper extremity.  Right lower facial droop.  Decreased sensation of right upper extremity and right face. Skin: Skin is  warm, dry  Psychiatric: Mood and affect are normal. Speech and behavior are normal.  ED Results / Procedures / Treatments   Labs (all labs ordered are listed, but only abnormal results are displayed) Labs Reviewed  CBG MONITORING, ED - Abnormal; Notable for the following components:      Result Value   Glucose-Capillary 106 (*)    All other components within normal limits  COMPREHENSIVE METABOLIC PANEL  CBC WITH DIFFERENTIAL/PLATELET  PROTIME-INR  APTT  BRAIN NATRIURETIC PEPTIDE  TROPONIN I (HIGH SENSITIVITY)    EKG EKG Interpretation  Date/Time:  Monday February 23 2022 13:35:07 EDT Ventricular Rate:  83 PR Interval:  166 QRS Duration: 142 QT Interval:  435 QTC Calculation: 512 R Axis:   59 Text Interpretation: Sinus rhythm Atrial premature complex Biatrial enlargement Right bundle branch block St depressions anterior leads Confirmed by Georgina Snell [phone]) on 02/23/2022 1:46:44 PM  Radiology No results found.  Procedures Procedures  Remain on constant cardiac monitoring normal sinus rhythm  Medications Ordered in ED Medications  labetalol (NORMODYNE) injection 10 mg (has no administration in time range)    ED Course/ Medical Decision Making/ A&P                           Medical Decision Making [facility] is a 71 y.o. female.  With PMH of ESRD on HD Tuesday Thursday Saturday last dialysis this past Saturday, HTN, HFrEF, HLD, previous CVA with no residual deficits per patient who presented with stroke symptoms including right upper extremity weakness and numbness, right facial droop and dysarthria.   Patient presenting to the ED with symptoms concerning for CVA. On arrival, the patient's airway is intact. On arrival, glucose 106, BP 207/132. Reported symptoms include right facial droop, right upper extremity weakness and numbness, dysarthria. Last known normal was 9 PM on 02/22/2022. Patient denies recent CVA or trauma,, no sudden onset headache or other  symptoms concerning for [facility], no recent surgery. Based on my exam, patient is VAN positive and has an NIHSS of 9.    Patient taken for emergent CT upon arrival . Patient is (outside of time window of 3-4.5 hours since onset of symptoms/contraindications below).  Additionally, is anticoagulated and would not be a TNK candidate  I am not concerned for dissection with no chest pain, nontoxic appearance, no pulse deficits nontoxic appearance and CT head scan findings consistent with findings on physical exam today.  Spoke with neurology Dr Leonie Man.  They spoke with the family are deeming last known normal greater than 48 hours.  Her CT head showed no ICH but evidence of a left frontoparietal hypoattenuation and subacute infarct.  They will order for MRI/MRA for inpatient and recommend admission to hospitalist for continued blood pressure control and PT/OT and stroke work-up.  I am going to order for IV labetalol to  aim for goal of 20% reduction and rectal aspirin.      Amount and/or Complexity of Data Reviewed Labs: ordered. Radiology: ordered.  Risk OTC drugs. Prescription drug management. Decision regarding hospitalization.    Final Clinical Impression(s) / ED Diagnoses Final diagnoses:  Weakness of right upper extremity    Rx / DC Orders ED Discharge Orders     None         Elgie Congo, MD 02/23/22 1846

## 2022-02-23 NOTE — ED Notes (Signed)
ED TO INPATIENT HANDOFF REPORT  ED Nurse Name and Phone #: Josh  S Name/Age/Gender Monique Henderson 71 y.o. female Room/Bed: 020C/020C  Code Status   Code Status: DNR  Home/SNF/Other Home Patient oriented to: self, place, time, and situation Is this baseline? Yes   Triage Complete: Triage complete  Chief Complaint Stroke [facility]) [I63.9]  Triage Note Pt arrived via [facility] from home. Pt went to bed at 9pm on the 23rd, woke up the 24th with right sided deficits (facial droop, slurred speech, right sided weakness). She is a dialysis pt, and last went on Saturday.   The family of the pt called EMS because her symptoms did not improve from yesterday morning. Pt's right hand is swollen and in pain. EMS was unsuccessful with IV access  LVS  207/132 HR 85  98% RA  18RR     Allergies Allergies  Allergen Reactions   Shrimp [Shellfish Allergy] Shortness Of Breath   Bactroban [Mupirocin] Other (See Comments)    "Sores in nose"   Tylenol [Acetaminophen] Itching   Zestril [Lisinopril] Cough    Level of Care/Admitting Diagnosis ED Disposition     ED Disposition  Admit   Condition  --   [place]: [place] [[id number]]  Level of Care: Telemetry Medical [104]  May admit patient to [facility] or [facility] if equivalent level of care is available:: No  Covid Evaluation: Asymptomatic - no recent exposure (last 10 days) testing not required  Diagnosis: Stroke [facility]) [[id number]]  Admitting Physician: Lequita Halt [[id number]]  Attending Physician: Lequita Halt [[id number]]  Certification:: I certify this patient will need inpatient services for at least 2 midnights  Estimated Length of Stay: 3          B Medical/Surgery History Past Medical History:  Diagnosis Date   AF (paroxysmal atrial fibrillation) ([place]) 05/29/2019   on Coumadin   Aortic atherosclerosis ([place]) 07/05/2019   Aortic dissection ([place]) 04/04/2019   s/p repair   Bone spur 2008    Right calcaneal foot spur   Breast cancer ([place]) 2004   Ductal carcinoma in situ of the left breast; S/P left partial mastectomy 02/26/2003; S/P re-excision of cranial and lateral margins11/18/2004.radiation   Cerebral thrombosis with cerebral infarction 05/22/2019   Chronic low back pain 06/22/2016   Chronic obstructive lung disease ([place]) 01/16/2017   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 Autumn Messing; S/P re-excision of superior and inferior margins 10/27/2012.    ESRD on hemodialysis ([place]) 05/29/2019   Essential hypertension 09/16/2006   GERD 09/16/2006   Hepatitis C    treated and RNA confirmed not detectable 01/2017   Insomnia 03/14/2015   Malnutrition of moderate degree 05/19/2019   Non compliance with medical treatment 12/04/2017   Normocytic anemia    With thrombocytosis   Osteoarthritis    Right ureteral stone 2002   S/P lumbar spinal fusion 01/18/2014   S/P lumbar decompressive laminectomy, fusion, and plating for lumbar spinal stenosis on 05/27/2009 by Dr. Eustace Moore.  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 Eustace Moore.     Tobacco use disorder 04/19/2009   Uterine fibroid    Wears dentures    top   Past Surgical History:  Procedure Laterality Date  ANTERIOR LAT LUMBAR FUSION N/A 01/18/2014   Procedure: ANTERIOR LATERAL LUMBAR FUSION LUMBAR TWO-THREE;  Surgeon: Eustace Moore, MD;  Location: [place] [facility];  Service: Neurosurgery;  Laterality: N/A;   ANTERIOR LUMBAR FUSION  01/18/2014   AV FISTULA PLACEMENT Left 04/20/2019   Procedure: ARTERIOVENOUS (AV) FISTULA CREATION;  Surgeon: [name], MD;  Location: [place];  Service: Vascular;  Laterality: Left;   BACK SURGERY     BREAST LUMPECTOMY Left 01/2003    BREAST LUMPECTOMY Right 2014   BREAST LUMPECTOMY WITH NEEDLE LOCALIZATION AND AXILLARY SENTINEL LYMPH NODE BX Right 10/13/2012   Procedure: BREAST LUMPECTOMY WITH NEEDLE LOCALIZATION;  Surgeon: [name], MD;  Location: [place];  Service: General;  Laterality: Right;  Right breast wire localized lumpectomy   INSERTION OF DIALYSIS CATHETER Right 04/20/2019   Procedure: INSERTION OF DIALYSIS CATHETER, right internal jugular;  Surgeon: [name], MD;  Location: [place];  Service: Vascular;  Laterality: Right;   INSERTION OF DIALYSIS CATHETER Right 09/24/2020   Procedure: INSERTION OF TUNNELED DIALYSIS CATHETER;  Surgeon: Rosetta Posner, MD;  Location: [place];  Service: Vascular;  Laterality: Right;   IR FLUORO GUIDE CV LINE RIGHT  09/22/2020   IR THORACENTESIS ASP PLEURAL SPACE W/IMG GUIDE  05/19/2019   IR US GUIDE VASC ACCESS LEFT  09/22/2020   IR US GUIDE VASC ACCESS RIGHT  09/22/2020   IR VENOCAVAGRAM SVC  09/22/2020   LAMINECTOMY  05/27/2009   Lumbar decompressive laminectomy, fusion and plating for lumbar spinal stensosis   LIGATION OF ARTERIOVENOUS  FISTULA Left 09/24/2020   Procedure: LIGATION OF LEFT ARM ARTERIOVENOUS  FISTULA;  Surgeon: Rosetta Posner, MD;  Location: [place];  Service: Vascular;  Laterality: Left;   LUMBAR LAMINECTOMY/DECOMPRESSION MICRODISCECTOMY Left 03/23/2013   Procedure: LUMBAR LAMINECTOMY/DECOMPRESSION MICRODISCECTOMY 1 LEVEL;  Surgeon: Eustace Moore, MD;  Location[facility];  Service: Neurosurgery;  Laterality: Left;  LUMBAR LAMINECTOMY/DECOMPRESSION MICRODISCECTOMY 1 LEVEL   MASTECTOMY, PARTIAL Left 02/26/2003   ; S/P re-excision of cranial and lateral margins 04/19/2003.    RE-EXCISION OF BREAST CANCER,SUPERIOR MARGINS Right 10/27/2012   Procedure: RE-EXCISION OF BREAST CANCER,SUPERIOR and inferior MARGINS;  Surgeon: [name], MD;  Location: [place];  Service: General;  Laterality: Right;   RE-EXCISION OF BREAST LUMPECTOMY Left  04/2003   TEE WITHOUT CARDIOVERSION N/A 04/04/2019   Procedure: Transesophageal Echocardiogram (Tee);  Surgeon: Wonda Olds, MD;  Location: [place];  Service: Open Heart Surgery;  Laterality: N/A;   THORACIC AORTIC ANEURYSM REPAIR N/A 04/04/2019   Procedure: THORACIC ASCENDING ANEURYSM REPAIR (AAA)  USING 28 MM X 30 CM HEMASHIELD PLATINUM VASCULAR GRAFT;  Surgeon: Wonda Olds, MD;  Location[facility];  Service: Open Heart Surgery;  Laterality: N/A;     A IV Location/Drains/Wounds Patient Lines/Drains/Airways Status     Active Line/Drains/Airways     Name Placement date Placement time Site Days   Peripheral IV 02/23/22 22 G 1.75" Anterior;Left Forearm 02/23/22  1611  Forearm  less than 1   Hemodialysis Catheter Right Subclavian --  --  Subclavian  --   Hemodialysis Catheter Right Subclavian --  --  Subclavian  --   Incision (Closed) 09/24/20 Arm Left 09/24/20  1317  -- 517   Incision (Closed) 09/24/20 Chest Right 09/24/20  1317  -- 517            Intake/Output Last 24 hours No intake or output data in the 24  hours ending 02/23/22 2044  Labs/Imaging Results for orders placed or performed during the hospital encounter of 02/23/22 (from the past 48 hour(s))  CBG monitoring, ED     Status: Abnormal   Collection Time: 02/23/22  1:57 PM  Result Value Ref Range   Glucose-Capillary 106 (H) 70 - 99 mg/dL    Comment: Glucose reference range applies only to samples taken after fasting for at least 8 hours.  Comprehensive metabolic panel     Status: Abnormal   Collection Time: 02/23/22  2:44 PM  Result Value Ref Range   Sodium 141 135 - 145 mmol/L   Potassium 5.9 (H) 3.5 - 5.1 mmol/L   Chloride 102 98 - 111 mmol/L   CO2 26 22 - 32 mmol/L   Glucose, Bld 93 70 - 99 mg/dL    Comment: Glucose reference range applies only to samples taken after fasting for at least 8 hours.   BUN 42 (H) 8 - 23 mg/dL   Creatinine, Ser 8.96 (H) 0.44 - 1.00 mg/dL   Calcium 10.5 (H) 8.9 - 10.3 mg/dL    Total Protein 7.5 6.5 - 8.1 g/dL   Albumin 4.1 3.5 - 5.0 g/dL   AST 44 (H) 15 - 41 U/L   ALT 12 0 - 44 U/L   Alkaline Phosphatase 78 38 - 126 U/L   Total Bilirubin 0.6 0.3 - 1.2 mg/dL   GFR, Estimated 4 (L) >60 mL/min    Comment: (NOTE) Calculated using the CKD-EPI Creatinine Equation (2021)    Anion gap 13 5 - 15    Comment: Performed at [facility], [place] [address], [place], [place] [address]  Troponin I (High Sensitivity)     Status: Abnormal   Collection Time: 02/23/22  2:44 PM  Result Value Ref Range   Troponin I (High Sensitivity) 153 (HH) <18 ng/L    Comment: CRITICAL RESULT CALLED TO, READ BACK BY AND VERIFIED WITH J. Mainor Hellmann RN 02/23/22 '@1616'$  BY J. WHITE (NOTE) Elevated high sensitivity troponin I (hsTnI) values and significant  changes across serial measurements may suggest ACS but many other  chronic and acute conditions are known to elevate hsTnI results.  Refer to the "Links" section for chest pain algorithms and additional  guidance. Performed at [facility], [place] [address] [place], [place] [address]   Protime-INR     Status: None   Collection Time: 02/23/22  2:44 PM  Result Value Ref Range   Prothrombin Time 13.5 11.4 - 15.2 seconds   INR 1.0 0.8 - 1.2    Comment: (NOTE) INR goal varies based on device and disease states. Performed at [facility], [place] [address], [place], [place] [address]   APTT     Status: None   Collection Time: 02/23/22  2:44 PM  Result Value Ref Range   aPTT 31 24 - 36 seconds    Comment: Performed at [facility] [address] [place], [place] [address]  CBG monitoring, ED     Status: Abnormal   Collection Time: 02/23/22  5:55 PM  Result Value Ref Range   Glucose-Capillary 103 (H) 70 - 99 mg/dL    Comment: Glucose reference range applies only to samples taken after fasting for at least 8 hours.  Troponin I (High Sensitivity)     Status: Abnormal   Collection Time: 02/23/22  6:46 PM  Result Value Ref  Range   Troponin I (High Sensitivity) 147 (HH) <18 ng/L  Comment: CRITICAL RESULT CALLED TO, READ BACK BY AND VERIFIED WITH J. Betsy [place] RN 02/23/22 '@1949'$  BY J.WHITE (NOTE) Elevated high sensitivity troponin I (hsTnI) values and significant  changes across serial measurements may suggest ACS but many other  chronic and acute conditions are known to elevate hsTnI results.  Refer to the "Links" section for chest pain algorithms and additional  guidance. Performed at [facility], [place] [address], [place], [place] [address]   CBC with Differential/Platelet     Status: Abnormal   Collection Time: 02/23/22  6:46 PM  Result Value Ref Range   WBC 4.4 4.0 - 10.5 K/uL   RBC 3.81 (L) 3.87 - 5.11 MIL/uL   Hemoglobin 11.8 (L) 12.0 - 15.0 g/dL   HCT 38.7 36.0 - 46.0 %   MCV 101.6 (H) 80.0 - 100.0 fL   MCH 31.0 26.0 - 34.0 pg   MCHC 30.5 30.0 - 36.0 g/dL   RDW 16.3 (H) 11.5 - 15.5 %   Platelets 284 150 - 400 K/uL   nRBC 0.0 0.0 - 0.2 %   Neutrophils Relative % 63 %   Neutro Abs 2.8 1.7 - 7.7 K/uL   Lymphocytes Relative 23 %   Lymphs Abs 1.0 0.7 - 4.0 K/uL   Monocytes Relative 9 %   Monocytes Absolute 0.4 0.1 - 1.0 K/uL   Eosinophils Relative 4 %   Eosinophils Absolute 0.2 0.0 - 0.5 K/uL   Basophils Relative 1 %   Basophils Absolute 0.1 0.0 - 0.1 K/uL   Immature Granulocytes 0 %   Abs Immature Granulocytes 0.01 0.00 - 0.07 K/uL    Comment: Performed at [facility] [address], [place],  [address]  Brain natriuretic peptide     Status: Abnormal   Collection Time: 02/23/22  6:46 PM  Result Value Ref Range   B Natriuretic Peptide >4,500.0 (H) 0.0 - 100.0 pg/mL    Comment: Performed at [facility] [address] [place], [place] [address]  Hemoglobin A1c     Status: Abnormal   Collection Time: 02/23/22  6:46 PM  Result Value Ref Range   Hgb A1c MFr Bld 4.7 (L) 4.8 - 5.6 %    Comment: (NOTE) Pre diabetes:          5.7%-6.4%  Diabetes:               >6.4%  Glycemic control for   <7.0% adults with diabetes    Mean Plasma Glucose 88.19 mg/dL    Comment: Performed at [facility] [address] [place], [place] [address]  Potassium     Status: None   Collection Time: 02/23/22  6:46 PM  Result Value Ref Range   Potassium 4.0 3.5 - 5.1 mmol/L    Comment: Performed at [facility] [address], [place], [place] [address]   DG Chest Portable 1 View  Result Date: 02/23/2022 CLINICAL DATA:  Stroke EXAM: PORTABLE CHEST 1 VIEW COMPARISON:  Radiograph 01/29/2022 FINDINGS: Unchanged enlarged cardiomediastinal silhouette with prior median sternotomy. Right neck catheter tip overlies the right atrium. There is no focal airspace consolidation. There is no pleural effusion. No pneumothorax. No acute osseous abnormality. IMPRESSION: Unchanged cardiomegaly.  No new airspace disease. Electronically Signed   By: Maurine Simmering M.D.   On: 02/23/2022 15:06   CT Head Wo Contrast  Result Date: 02/23/2022 CLINICAL DATA:  RUE weakness, right facial droop, dysarthria EXAM: CT HEAD WITHOUT CONTRAST TECHNIQUE: Contiguous  axial images were obtained from the base of the skull through the vertex without intravenous contrast. RADIATION DOSE REDUCTION: This exam was performed according to the departmental dose-optimization program which includes automated exposure control, adjustment of the mA and/or kV according to patient size and/or use of iterative reconstruction technique. COMPARISON:  CT head 12/24/2021. FINDINGS: Brain: Subtle high left frontoparietal hypoattenuation and possible loss of gray differentiation (for example see series 2, image 19 and series 5, image 33). No evidence of acute hemorrhage, mass lesion midline shift or hydrocephalus. Patchy white matter hypoattenuation, nonspecific but compatible with chronic microvascular disease. Vascular: Calcific atherosclerosis. No convincing hyperdense vessel identified. Skull: No acute fracture.  Sinuses/Orbits: Clear sinuses.  No acute orbital findings. Other: No mastoid effusions. Dr. Leonie Man paged at the time of dictation for call of report. IMPRESSION: 1. Subtle high left frontoparietal hypoattenuation and possible loss of gray differentiation, which could represent acute infarct (particularly given the clinical history) or artifact. Recommend MRI. 2. No acute hemorrhage. Electronically Signed   By: Margaretha Sheffield M.D.   On: 02/23/2022 14:32    Pending Labs Unresulted Labs (From admission, onward)     Start     Ordered   02/24/22 0500  Lipid panel  (Labs)  Tomorrow morning,   R       Comments: Fasting    02/23/22 1657   02/24/22 1610  Basic metabolic panel  Tomorrow morning,   R        02/23/22 1658   02/23/22 1713  Rapid urine drug screen (hospital performed)  ONCE - STAT,   STAT        02/23/22 1712   02/23/22 1402  CBC with Differential  Once,   STAT        02/23/22 1403            Vitals/Pain Today's Vitals   02/23/22 1815 02/23/22 1827 02/23/22 1830 02/23/22 1845  BP: (!) 205/135  (!) 215/121 (!) 207/126  Pulse:      Resp: (!) 21  (!) 26 15  Temp:  98.4 F (36.9 C)    TempSrc:  Oral    SpO2:        Isolation Precautions No active isolations  Medications Medications  aspirin suppository 300 mg (0 mg Rectal Hold 02/23/22 1657)  cloNIDine (CATAPRES - Dosed in mg/24 hr) patch 0.1 mg (has no administration in time range)  0.9 %  sodium chloride infusion (has no administration in time range)   stroke: early stages of recovery book (has no administration in time range)  acetaminophen (TYLENOL) tablet 650 mg (has no administration in time range)    Or  acetaminophen (TYLENOL) 160 MG/5ML solution 650 mg (has no administration in time range)    Or  acetaminophen (TYLENOL) suppository 650 mg (has no administration in time range)  LORazepam (ATIVAN) injection 0.5 mg (has no administration in time range)  heparin injection 5,000 Units (has no administration in  time range)  sodium zirconium cyclosilicate (LOKELMA) packet 10 g (has no administration in time range)  labetalol (NORMODYNE) injection 20 mg (has no administration in time range)  albuterol (PROVENTIL) (2.5 MG/3ML) 0.083% nebulizer solution 2.5 mg (has no administration in time range)  labetalol (NORMODYNE) injection 10 mg (10 mg Intravenous Given 02/23/22 1657)    Mobility walks Low fall risk   Focused Assessments     R Recommendations: See Admitting Provider Note  Report given to:   Additional Notes:

## 2022-02-23 NOTE — Consult Note (Addendum)
Neurology Consultation  Reason for Consult: Code Stroke Referring Physician: Dr. Laverta Baltimore  CC: Right upper extremity weakness, right facial weakness, slurred speech   History is obtained from: Patient, chart review, patient's sister via telephone  HPI: Monique Henderson is a 71 y.o. 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  Initially, last known well was unclear as patient was adamant that she was well before bed last night at 21:00 and when she woke up at 07:00, her deficits were present.  Patient's sister via telephone noted that the patient has a friend that stays with her that reported her symptoms were present upon waking from sleep on 02/22/22 with a last known well of 9/23.  Patient refused to go to the hospital and be evaluated yesterday but since the symptoms persisted today she decided to be evaluated.  On further evaluation, patient did admit that her symptoms were present yesterday on 9/24.  Her last dose of Eliquis was 9/24.  Some history limited from patient due to severe dysarthria.  While in triage, patient's blood glucose was obtained at 106 and her blood pressure was elevated to 240/150.  LKW: 9/23 21:00 TNK given?: no, patient is on full anticoagulation at home and presents outside of the thrombolytic therapy time window IR Thrombectomy? No, patient's last known well is greater than 24 hours prior to hospital arrival and her presentation is not felt to be consistent with an LVO. Modified Rankin Scale:  0-Completely asymptomatic and back to baseline post- stroke  ROS: A complete ROS was performed and is negative except as noted in the HPI within some limitation as patient has severe dysarthria.  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 Autumn Messing; S/P re-excision of superior and inferior margins 10/27/2012.    ESRD on hemodialysis ([place]) 05/29/2019   Essential hypertension 09/16/2006   GERD 09/16/2006   Hepatitis C    treated and RNA confirmed not detectable 01/2017   Insomnia 03/14/2015   Malnutrition of moderate degree 05/19/2019   Non compliance with medical treatment 12/04/2017   Normocytic anemia    With thrombocytosis   Osteoarthritis    Right ureteral stone 2002   S/P lumbar spinal fusion 01/18/2014   S/P lumbar decompressive laminectomy, fusion, and plating for lumbar spinal stenosis on 05/27/2009 by Dr. Eustace Moore.  S/P anterolateral retroperitoneal interbody fusion L2-3 utilizing a 8 mm peek interbody cage packed with morcellized allograft, and anterior lumbar plating L2-3 for recurrent disc herniation L2-3 with  spinal stenosis on 01/18/2014 by Dr. Eustace Moore.     Tobacco use disorder 04/19/2009   Uterine fibroid    Wears dentures    top   Past Surgical History:  Procedure Laterality Date   ANTERIOR LAT LUMBAR FUSION N/A 01/18/2014    Procedure: ANTERIOR LATERAL LUMBAR FUSION LUMBAR TWO-THREE;  Surgeon: Eustace Moore, MD;  Location: [facility];  Service: Neurosurgery;  Laterality: N/A;   ANTERIOR LUMBAR FUSION  01/18/2014   AV FISTULA PLACEMENT Left 04/20/2019   Procedure: ARTERIOVENOUS (AV) FISTULA CREATION;  Surgeon: [name], MD;  Location: [facility];  Service: Vascular;  Laterality: Left;   BACK SURGERY     BREAST LUMPECTOMY Left 01/2003   BREAST LUMPECTOMY Right 2014   BREAST LUMPECTOMY WITH NEEDLE LOCALIZATION AND AXILLARY SENTINEL LYMPH NODE BX Right 10/13/2012   Procedure: BREAST LUMPECTOMY WITH NEEDLE LOCALIZATION;  Surgeon: [name], MD;  Location: [facility];  Service: General;  Laterality: Right;  Right breast wire localized lumpectomy   INSERTION OF DIALYSIS CATHETER Right 04/20/2019   Procedure: INSERTION OF DIALYSIS CATHETER, right internal jugular;  Surgeon: [name], MD;  Location: [facility];  Service: Vascular;  Laterality: Right;   INSERTION OF DIALYSIS CATHETER Right 09/24/2020   Procedure: INSERTION OF TUNNELED DIALYSIS CATHETER;  Surgeon: Rosetta Posner, MD;  Location: [facility];  Service: Vascular;  Laterality: Right;   IR FLUORO GUIDE CV LINE RIGHT  09/22/2020   IR THORACENTESIS ASP PLEURAL SPACE W/IMG GUIDE  05/19/2019   IR US GUIDE VASC ACCESS LEFT  09/22/2020   IR US GUIDE VASC ACCESS RIGHT  09/22/2020   IR VENOCAVAGRAM SVC  09/22/2020   LAMINECTOMY  05/27/2009   Lumbar decompressive laminectomy, fusion and plating for lumbar spinal stensosis   LIGATION OF ARTERIOVENOUS  FISTULA Left 09/24/2020   Procedure: LIGATION OF LEFT ARM ARTERIOVENOUS  FISTULA;  Surgeon: Rosetta Posner, MD;  Location: [facility];  Service: Vascular;  Laterality: Left;   LUMBAR LAMINECTOMY/DECOMPRESSION MICRODISCECTOMY Left 03/23/2013   Procedure: LUMBAR LAMINECTOMY/DECOMPRESSION MICRODISCECTOMY 1 LEVEL;  Surgeon: Eustace Moore, MD;  Location: [facility];  Service: Neurosurgery;  Laterality:  Left;  LUMBAR LAMINECTOMY/DECOMPRESSION MICRODISCECTOMY 1 LEVEL   MASTECTOMY, PARTIAL Left 02/26/2003   ; S/P re-excision of cranial and lateral margins 04/19/2003.    RE-EXCISION OF BREAST CANCER,SUPERIOR MARGINS Right 10/27/2012   Procedure: RE-EXCISION OF BREAST CANCER,SUPERIOR and inferior MARGINS;  Surgeon: [name], MD;  Location: [facility];  Service: General;  Laterality: Right;   RE-EXCISION OF BREAST LUMPECTOMY Left 04/2003   TEE WITHOUT CARDIOVERSION N/A 04/04/2019   Procedure: Transesophageal Echocardiogram (Tee);  Surgeon: Wonda Olds, MD;  Location: [facility];  Service: Open Heart Surgery;  Laterality: N/A;   THORACIC AORTIC ANEURYSM REPAIR N/A 04/04/2019   Procedure: THORACIC ASCENDING ANEURYSM REPAIR (AAA)  USING 28 MM X 30 CM HEMASHIELD PLATINUM VASCULAR GRAFT;  Surgeon: Wonda Olds, MD;  Location: [facility];  Service: Open Heart Surgery;  Laterality: N/A;   Family History  Problem Relation Age of Onset   Colon cancer Mother 3   Hypertension Mother    Diabetes Sister 67   Hypertension Sister    Diabetes Brother    Hypertension Brother    Diabetes Brother    Hypertension Brother    Kidney disease Son        s/p renal transplant   Hypertension Son    Diabetes Son    Multiple sclerosis  Son    Bone cancer Sister 19   Breast cancer Neg Hx    Cervical cancer Neg Hx    Social History:   reports that she has been smoking cigarettes. She has a 11.00 pack-year smoking history. She has never used smokeless tobacco. She reports that she does not currently use alcohol after a past usage of about 2.0 standard drinks of alcohol per week. She reports that she does not currently use drugs after having used the following drugs: Cocaine.  Medications  Current Facility-Administered Medications:    aspirin suppository 300 mg, 300 mg, Rectal, Once, Elgie Congo, MD   labetalol (NORMODYNE) injection 10 mg, 10 mg, Intravenous, Once, Elgie Congo, MD  Current  Outpatient Medications:    albuterol (VENTOLIN HFA) 108 (90 Base) MCG/ACT inhaler, Inhale 2 puffs into the lungs every 6 (six) hours as needed for wheezing or shortness of breath., Disp: 8.5 g, Rfl: 0   amLODipine (NORVASC) 10 MG tablet, Take 1 tablet (10 mg total) by mouth daily., Disp: 90 tablet, Rfl: 0   apixaban (ELIQUIS) 2.5 MG TABS tablet, Take 1 tablet (2.5 mg total) by mouth 2 (two) times daily., Disp: 60 tablet, Rfl: 0   carvedilol (COREG) 6.25 MG tablet, Take 1 tablet (6.25 mg total) by mouth 2 (two) times daily with a meal., Disp: 60 tablet, Rfl: 1   Cholecalciferol (VITAMIN D-3 PO), Take 1 capsule by mouth daily., Disp: , Rfl:    cloNIDine (CATAPRES - DOSED IN MG/24 HR) 0.2 mg/24hr patch, Place 1 patch (0.2 mg total) onto the skin every Saturday., Disp: 4 patch, Rfl: 12   hydrALAZINE (APRESOLINE) 50 MG tablet, Take 1 tablet (50 mg total) by mouth 3 (three) times daily., Disp: 90 tablet, Rfl: 0   isosorbide mononitrate (IMDUR) 30 MG 24 hr tablet, Take 2 tablets (60 mg total) by mouth daily. (Patient taking differently: Take 30 mg by mouth daily.), Disp: 30 tablet, Rfl: 0   losartan (COZAAR) 100 MG tablet, Take 1 tablet (100 mg total) by mouth daily., Disp: 90 tablet, Rfl: 0   mirtazapine (REMERON) 7.5 MG tablet, Take 1 tablet (7.5 mg total) by mouth at bedtime. (Patient taking differently: Take 7.5 mg by mouth at bedtime as needed (sleep).), Disp: 90 tablet, Rfl: 3   rosuvastatin (CRESTOR) 10 MG tablet, Take 10 mg by mouth daily., Disp: , Rfl:    sevelamer carbonate (RENVELA) 2.4 g PACK, 2.4 g with breakfast, with lunch, and with evening meal., Disp: , Rfl:    torsemide 40 MG TABS, Take 40 mg by mouth 2 (two) times daily., Disp: 60 tablet, Rfl: 0  Exam: Current vital signs: BP (!) 215/123   Pulse 87   Temp 98.5 F (36.9 C)   Resp (!) 25   SpO2 97%  Vital signs in last 24 hours: Temp:  [98.5 F (36.9 C)] 98.5 F (36.9 C) (09/25 1335) Pulse Rate:  [85-87] 87 (09/25 1348) Resp:   [17-25] 25 (09/25 1500) BP: (207-215)/(123-153) 215/123 (09/25 1500) SpO2:  [97 %-98 %] 97 % (09/25 1348)  GENERAL: Awake, alert, appears anxious Psych: Affect appropriate for situation.  Patient is anxious and tearful but is cooperative with exam. Head: Normocephalic and atraumatic, without obvious abnormality EENT: Normal conjunctivae, dry mucous membranes, no OP obstruction, arcus senilis present bilaterally LUNGS: Normal respiratory effort. Non-labored breathing on room air CV: Regular rate and rhythm on telemetry ABDOMEN: Soft, non-tender, non-distended Extremities: Cool to touch, without obvious deformity or edema  NEURO:  Mental  Status: Awake, alert, and oriented to self, age, month, and situation. She initially indicates that her symptoms were present upon waking this morning however does change her story to say that her symptoms were present yesterday after further conversation. Speech/Language: speech is severely dysarthric but can be understood with slight difficulty..  No aphasia or neglect noted Cranial Nerves:  II: PERRL. Visual fields full.  III, IV, VI: EOMI without gaze preference or ptosis V: Sensation is intact to light touch and symmetrical to face.  VII: Right lower facial moderate weakness present VIII: Hearing is intact to voice IX, X: Palate elevation is symmetric. Phonation normal.  XI: Normal sternocleidomastoid and trapezius muscle strength XII: Tongue protrudes midline Motor: Right upper extremity is flaccid with only trace withdrawal to painful stimulus..  5/5 strength throughout bilateral lower extremities and left upper extremity. Tone is flaccid left upper extremity. Bulk is normal.  Sensation: Intact to light touch bilaterally in all four extremities. No extinction to DSS present.  Coordination: Unable to assess FTN right upper extremity, no ataxia left upper extremity.  HKS intact bilaterally.  Gait: Deferred  NIHSS @ 14:25: 1a Level of Conscious.:  0 1b LOC Questions: 0 1c LOC Commands: 0 2 Best Gaze: 0 3 Visual: 0 4 Facial Palsy: 2 5a Motor Arm - left: 0 5b Motor Arm - Right: 4 6a Motor Leg - Left: 0 6b Motor Leg - Right: 0 7 Limb Ataxia: 0 8 Sensory: 0 9 Best Language: 0 10 Dysarthria: 2 11 Extinct. and Inatten.: 0 TOTAL: 8  Labs I have reviewed labs in epic and the results pertinent to this consultation are: CBC    Component Value Date/Time   WBC 5.8 01/30/2022 0720   RBC 3.07 (L) 01/30/2022 0720   HGB 9.4 (L) 01/30/2022 0720   HGB 8.1 (L) 05/13/2020 1108   HGB 13.1 09/28/2012 0831   HCT 30.5 (L) 01/30/2022 0720   HCT 25.2 (L) 05/13/2020 1108   HCT 40.1 09/28/2012 0831   PLT 244 01/30/2022 0720   PLT 339 01/05/2020 1522   MCV 99.3 01/30/2022 0720   MCV 88 05/13/2020 1108   MCV 90.7 09/28/2012 0831   MCH 30.6 01/30/2022 0720   MCHC 30.8 01/30/2022 0720   RDW 18.2 (H) 01/30/2022 0720   RDW 15.8 (H) 05/13/2020 1108   RDW 14.2 09/28/2012 0831   LYMPHSABS 0.7 01/29/2022 1128   LYMPHSABS 1.0 05/13/2020 1108   LYMPHSABS 1.8 09/28/2012 0831   MONOABS 0.7 01/29/2022 1128   MONOABS 0.5 09/28/2012 0831   EOSABS 0.1 01/29/2022 1128   EOSABS 0.1 05/13/2020 1108   BASOSABS 0.0 01/29/2022 1128   BASOSABS 0.1 05/13/2020 1108   BASOSABS 0.0 09/28/2012 0831   CMP     Component Value Date/Time   NA 137 01/30/2022 0720   NA 140 05/13/2020 1108   NA 142 09/28/2012 0831   K 3.6 01/30/2022 0720   K 4.2 09/28/2012 0831   CL 95 (L) 01/30/2022 0720   CL 103 09/28/2012 0831   CO2 30 01/30/2022 0720   CO2 30 (H) 09/28/2012 0831   GLUCOSE 126 (H) 01/30/2022 0720   GLUCOSE 103 (H) 09/28/2012 0831   BUN 15 01/30/2022 0720   BUN 47 (H) 05/13/2020 1108   BUN 18.3 09/28/2012 0831   CREATININE 3.74 (H) 01/30/2022 0720   CREATININE 2.09 (H) 01/18/2017 1145   CREATININE 1.2 (H) 09/28/2012 0831   CALCIUM 9.3 01/30/2022 0720   CALCIUM 9.4 09/28/2012 0831   PROT 6.9 01/06/2022 1046  PROT 6.9 05/13/2020 1108   PROT 7.2  09/28/2012 0831   ALBUMIN 3.1 (L) 01/08/2022 0944   ALBUMIN 4.0 05/13/2020 1108   ALBUMIN 3.5 09/28/2012 0831   AST 21 01/06/2022 1046   AST 35 (H) 09/28/2012 0831   ALT 22 01/06/2022 1046   ALT 23 12/09/2016 1458   ALT 41 09/28/2012 0831   ALKPHOS 102 01/06/2022 1046   ALKPHOS 89 09/28/2012 0831   BILITOT 0.8 01/06/2022 1046   BILITOT 0.2 05/13/2020 1108   BILITOT 0.34 09/28/2012 0831   GFRNONAA 13 (L) 01/30/2022 0720   GFRNONAA 23 (L) 12/09/2016 1458   GFRAA 8 (L) 05/13/2020 1108   GFRAA 26 (L) 12/09/2016 1458   Lipid Panel     Component Value Date/Time   CHOL 86 06/21/2021 0229   CHOL 167 05/13/2020 1108   TRIG 21 06/21/2021 0229   HDL 42 06/21/2021 0229   HDL 39 (L) 05/13/2020 1108   CHOLHDL 2.0 06/21/2021 0229   VLDL 4 06/21/2021 0229   LDLCALC 40 06/21/2021 0229   LDLCALC 100 (H) 05/13/2020 1108   Lab Results  Component Value Date   HGBA1C 5.1 06/20/2021   Imaging I have reviewed the images obtained:  CT-scan of the brain Code Stroke 9/25: 1. Subtle high left frontoparietal hypoattenuation and possible loss of gray differentiation, which could represent acute infarct (particularly given the clinical history) or artifact. Recommend MRI. 2. No acute hemorrhage.  MRI examination of the brain, MRA head and neck without contrast pending  Assessment: 71 y.o. female with PMHx of PAF on Eliquis, aortic atherosclerosis, aortic dissection s/p repair, ductal carcinoma in situ bilaterally s/p treatment, ESRD on HD TTS, hepatitis C treated, history of noncompliance, and history of tobacco use who presented to the ED for evaluation of 2 days of right upper extremity weakness, right facial droop, and slurred speech.  A code stroke was activated in triage for further evaluation.  Initially patient reported last known well was 9/24 at 21:00 and symptoms were present at 07:00 this morning when patient woke from sleep and patient was taken to CT for further imaging. - Examination  reveals patient with right lower facial weakness, flaccid right upper extremity and severe dysarthria with an NIHSS of 8. - While in CT, attempts at IV access were unsuccessful.  Further interview with patient's sister via telephone and patient reveals that patient's true last known well time was 9/23 at 21:00.  Further, patient's presentation was felt not to be consistent with LVO and further CT imaging was deferred at that time.  MRI imaging was ordered for further evaluation. - Patient is not a candidate for TNK as her last known well was 9/23 at 2100 and she is on Eliquis for PAF last dose 9/24. - CT imaging with some concern for subtle high left frontoparietal hypoattenuation and possible loss of gray differentiation, which could represent acute infarct versus artifact.  MRI imaging ordered for further evaluation. - Patient's presentation is concerning for an acute lacunar infarct.  Will complete stroke evaluation. - Stroke risk factors include patient's age, history of atrial fibrillation, aortic atherosclerosis, HTN, history of tobacco use.  Recommendations: - HgbA1c, fasting lipid panel, ETOH, UDS - MRI brain, MRA head and neck without contrast - Frequent neuro checks - Echocardiogram - Prophylactic therapy-Antiplatelet med: Aspirin - dose '325mg'$  PO or '300mg'$  PR followed by ASA 81 mg PO daily - Hold Eliquis at this time - Permissive hypertension for 24-48 hours of symptom onset, treat SBP > 220  mmHg .  If necessary gradually reduce systolic blood pressure by not more than 30% on admission blood pressure - Risk factor modification - Telemetry monitoring - PT consult, OT consult, Speech consult - Hospitalist team to admit - Stroke team to follow  Pt seen by NP/Neuro together with attending MD. Note/plan to be edited by MD as needed.  Anibal Henderson, AGAC-NP [facility] Pager: [phone]  STROKE MD NOTE :  I have personally obtained history,examined this patient,  reviewed notes, independently viewed imaging studies, participated in medical decision making and plan of care.ROS completed by me personally and pertinent positives fully documented  I have made any additions or clarifications directly to the above note. Agree with note above.  71 year old African-American lady with medical medical problems and end-stage renal disease presents with sudden onset of dysarthria and right upper extremity weakness.  Patient is already on Eliquis and is not a candidate for thrombolysis and last known normal was probably greater than 24 hours out and not a candidate for thrombectomy plus clinically the stroke seems to localize to his subcortical region.  Recommend admission to the medical service for stroke work-up.  Check MR scan of the brain and MRA of the brain and neck without contrast.  CT angiogram was attempted but IV access could not be established and aborted.  Continue ongoing stroke work-up.  Patient is likely not able to swallow at the present time hence start aspirin rectally.  She can swallow and then switch back to Eliquis.  Physical occupational and speech therapy consults.  Long discussion with patient and answered questions about her presentation, evaluation and treatment.  Discussed with ER MD. greater than 50% time during this 70-minute consultation visit were spent in counseling and coordination of care about discussion about evaluation and treatment and answering questions.  Antony Contras, MD Medical Director [facility] Pager: [phone] 02/23/2022 3:58 PM

## 2022-02-24 ENCOUNTER — Inpatient Hospital Stay (HOSPITAL_COMMUNITY): Payer: Medicare Other

## 2022-02-24 ENCOUNTER — Other Ambulatory Visit (HOSPITAL_COMMUNITY): Payer: Medicare Other

## 2022-02-24 DIAGNOSIS — I63312 Cerebral infarction due to thrombosis of left middle cerebral artery: Secondary | ICD-10-CM | POA: Diagnosis not present

## 2022-02-24 DIAGNOSIS — I639 Cerebral infarction, unspecified: Secondary | ICD-10-CM | POA: Diagnosis not present

## 2022-02-24 DIAGNOSIS — I63412 Cerebral infarction due to embolism of left middle cerebral artery: Secondary | ICD-10-CM | POA: Diagnosis not present

## 2022-02-24 LAB — LIPID PANEL
Cholesterol: 104 mg/dL (ref 0–200)
HDL: 49 mg/dL (ref >40)
LDL Cholesterol: 46 mg/dL (ref 0–99)
Total CHOL/HDL Ratio: 2.1 RATIO
Triglycerides: 43 mg/dL (ref <150)
VLDL: 9 mg/dL (ref 0–40)

## 2022-02-24 LAB — BASIC METABOLIC PANEL
Anion gap: 11 (ref 5–15)
BUN: 47 mg/dL — ABNORMAL HIGH (ref 8–23)
CO2: 24 mmol/L (ref 22–32)
Calcium: 9.6 mg/dL (ref 8.9–10.3)
Chloride: 105 mmol/L (ref 98–111)
Creatinine, Ser: 10.03 mg/dL — ABNORMAL HIGH (ref 0.44–1.00)
GFR, Estimated: 4 mL/min — ABNORMAL LOW (ref >60)
Glucose, Bld: 149 mg/dL — ABNORMAL HIGH (ref 70–99)
Potassium: 4 mmol/L (ref 3.5–5.1)
Sodium: 140 mmol/L (ref 135–145)

## 2022-02-24 LAB — HEPATITIS B SURFACE ANTIGEN: Hepatitis B Surface Ag: NONREACTIVE

## 2022-02-24 LAB — HEPATITIS B CORE ANTIBODY, TOTAL: Hep B Core Total Ab: NONREACTIVE

## 2022-02-24 LAB — HEPATITIS B SURFACE ANTIBODY,QUALITATIVE: Hep B S Ab: REACTIVE — AB

## 2022-02-24 LAB — HEPATITIS C ANTIBODY: HCV Ab: REACTIVE — AB

## 2022-02-24 MED ORDER — CHLORHEXIDINE GLUCONATE CLOTH 2 % EX PADS
6.0000 | MEDICATED_PAD | Freq: Every day | CUTANEOUS | Status: DC
  Administered 2022-02-24 – 2022-02-26 (×2): 6 via TOPICAL

## 2022-02-24 MED ORDER — ALUM & MAG HYDROXIDE-SIMETH 200-200-20 MG/5ML PO SUSP: 30.0000 mL | Freq: Once | ORAL | Status: DC

## 2022-02-24 MED ORDER — HYDRALAZINE HCL 20 MG/ML IJ SOLN: 10.0000 mg | Freq: Four times a day (QID) | INTRAMUSCULAR | Status: DC | PRN

## 2022-02-24 MED ORDER — CARVEDILOL 6.25 MG PO TABS
6.2500 mg | ORAL_TABLET | Freq: Two times a day (BID) | ORAL | Status: DC
  Administered 2022-02-24 – 2022-02-26 (×3): 6.25 mg via ORAL
  Filled 2022-02-24 (×3): qty 1

## 2022-02-24 MED ORDER — ROSUVASTATIN CALCIUM 5 MG PO TABS
10.0000 mg | ORAL_TABLET | Freq: Every day | ORAL | Status: DC
  Administered 2022-02-24 – 2022-02-26 (×2): 10 mg via ORAL
  Filled 2022-02-24 (×2): qty 2

## 2022-02-24 MED ORDER — HEPARIN SODIUM (PORCINE) 1000 UNIT/ML IJ SOLN
INTRAMUSCULAR | Status: AC
  Filled 2022-02-24: qty 4

## 2022-02-24 MED ORDER — LABETALOL HCL 5 MG/ML IV SOLN: 20.0000 mg | INTRAVENOUS | Status: DC | PRN

## 2022-02-24 MED ORDER — DOXERCALCIFEROL 4 MCG/2ML IV SOLN
4.0000 ug | INTRAVENOUS | Status: DC
  Administered 2022-02-24 – 2022-02-26 (×2): 4 ug via INTRAVENOUS
  Filled 2022-02-24 (×3): qty 2

## 2022-02-24 MED ORDER — ISOSORBIDE MONONITRATE ER 30 MG PO TB24
30.0000 mg | ORAL_TABLET | Freq: Every day | ORAL | Status: DC
  Administered 2022-02-24 – 2022-02-26 (×2): 30 mg via ORAL
  Filled 2022-02-24 (×2): qty 1

## 2022-02-24 MED ORDER — AMLODIPINE BESYLATE 10 MG PO TABS
10.0000 mg | ORAL_TABLET | Freq: Every day | ORAL | Status: DC
  Administered 2022-02-24 – 2022-02-26 (×2): 10 mg via ORAL
  Filled 2022-02-24 (×2): qty 1

## 2022-02-24 MED ORDER — APIXABAN 2.5 MG PO TABS
2.5000 mg | ORAL_TABLET | Freq: Two times a day (BID) | ORAL | Status: DC
  Administered 2022-02-24 – 2022-02-26 (×4): 2.5 mg via ORAL
  Filled 2022-02-24 (×4): qty 1

## 2022-02-24 NOTE — Progress Notes (Addendum)
HOSPITAL MEDICINE OVERNIGHT EVENT NOTE    Nursing reports the patient has been experiencing intermittent chest discomfort over the past hour.    Chest discomfort is seemingly vague, midsternal and left-sided in location, sharp in quality and nonradiating.  She discomfort seems to wax and wane without any exertion.  Twelve-lead EKG obtained revealing no evidence of dynamic ST segment change.  Chart reviewed, patient has already had several troponins drawn on 9/25 which are slightly elevated, thought to not be secondary to plaque rupture and more likely secondary to the patient's markedly elevated blood pressures in the setting of ESRD.  We will try a trial of GI cocktail and obtain additional troponins to trend and ensure that they have not dramatically increased.  I expect for the trajectory of troponin elevation to remain flat.  We will continue to monitor on telemetry.  [name]  MD [facility]   ADDENDUM (9/27 4am)  Notified earlier in the shift that patient suddenly seemed to have increasing difficulty with swallowing despite receiving a dysphagia diet per SLP recommendations.  I therefore canceled the GI cocktail I ordered for this patient just prior.  Per my discussion with nursing patient is not exhibiting any other new neurologic change.  That being said, I discussed the case with Dr. Leonel Ramsay with neurology who recommended obtaining a repeat noncontrast MRI to ensure patient is not developing any new strokes.  Noncontrast MRI of the brain was performed earlier this morning and I reviewed the images with Dr. Leonel Ramsay.  He notes that there are no new insults to the brain and the 2 sets of images appear quite similar.  Considering the patient's worsening dysphagia will keep patient n.p.o. for now and have day team coordinate with SLP for repeat evaluation.  Sherryll Burger Mardie Kellen  ADDENDUM (9/27 7am)  Repeat troponin 135 revealing flat if not downtrending  trajectory of elevation.  Patient is currently chest pain-free.  Sherryll Burger Jeselle Hiser

## 2022-02-24 NOTE — Consult Note (Signed)
[facility] Renal Consultation Note    Indication for Consultation:  Management of ESRD/hemodialysis; anemia, hypertension/volume and secondary hyperparathyroidism PCP: No PCP  HPI: Monique Henderson is a 71 y.o. 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 PMH: HTN, PAF, H/O breast cancer, medical noncompliance, polysubstance abuse presented to ED 02/23/2022 for 11th hospitalization since 06/2021 for hypertensive emergency, RUE weakness, right facial droop, dysarthria, acute CVA. Code stroke called, CT of head without acute changes. Apparently symptoms began 02/22/2022 but she refused to come to hospital. She was not a candidate for TNK. MRI of head revealed acute to subacute cortical infarct in the left posterior frontal and anterior parietal lobe, including the central sulcus. Neurology has been consulted. She has been admitted for hypertensive emergency, CVA. She is a DNR.   Last HD 02/21/2022. She left clinic 1.3 kg above OP EDW, Post BP 208/117.  Seen in room. Initially tearful. Speech is garbled but responds appropriately to verbal stimuli. Speech does improve if she speaks slowly. Denies pain/SOB. She is holding RUE-cannot move RUE spontaneously otherwise moves all other extremities to command. Eating breakfast, no dysphagia noted. O2 sat 97% on RA. Emotional support to patient.   Past Medical History:  Diagnosis Date   AF (paroxysmal atrial fibrillation) ([place]) 05/29/2019   on Coumadin   Aortic atherosclerosis ([place]) 07/05/2019   Aortic dissection ([place]) 04/04/2019   s/p repair   Bone spur 2008   Right calcaneal foot spur   Breast cancer ([place]) 2004   Ductal carcinoma in situ of the left breast; S/P left partial mastectomy 02/26/2003; S/P re-excision of cranial and lateral margins11/18/2004.radiation   Cerebral thrombosis with cerebral infarction 05/22/2019   Chronic low back pain 06/22/2016   Chronic obstructive lung disease ([place])  01/16/2017   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 Autumn Messing; S/P re-excision of superior and inferior margins 10/27/2012.    ESRD on hemodialysis ([place]) 05/29/2019   Essential hypertension 09/16/2006   GERD 09/16/2006   Hepatitis C    treated and RNA confirmed not detectable 01/2017   Insomnia 03/14/2015   Malnutrition of moderate degree 05/19/2019   Non compliance with medical treatment 12/04/2017   Normocytic anemia    With thrombocytosis   Osteoarthritis    Right ureteral stone 2002   S/P lumbar spinal fusion 01/18/2014   S/P lumbar decompressive laminectomy, fusion, and plating for lumbar spinal stenosis on 05/27/2009 by Dr. Eustace Moore.  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 Eustace Moore.     Tobacco use disorder 04/19/2009   Uterine fibroid    Wears dentures    top   Past Surgical History:  Procedure Laterality Date   ANTERIOR LAT LUMBAR FUSION N/A 01/18/2014   Procedure: ANTERIOR LATERAL LUMBAR FUSION LUMBAR TWO-THREE;  Surgeon: Eustace Moore, MD;  Location: [place];  Service: Neurosurgery;  Laterality: N/A;   ANTERIOR LUMBAR FUSION  01/18/2014   AV FISTULA PLACEMENT Left 04/20/2019   Procedure: ARTERIOVENOUS (AV) FISTULA CREATION;  Surgeon: [name], MD;  Location: [place];  Service: Vascular;  Laterality: Left;   BACK SURGERY     BREAST LUMPECTOMY Left 01/2003   BREAST LUMPECTOMY Right 2014   BREAST LUMPECTOMY WITH NEEDLE LOCALIZATION AND AXILLARY SENTINEL LYMPH NODE  BX Right 10/13/2012   Procedure: BREAST LUMPECTOMY WITH NEEDLE LOCALIZATION;  Surgeon: [name], MD;  Location: [place];  Service: General;  Laterality: Right;  Right breast wire localized lumpectomy    INSERTION OF DIALYSIS CATHETER Right 04/20/2019   Procedure: INSERTION OF DIALYSIS CATHETER, right internal jugular;  Surgeon: [name], MD;  Location: [place];  Service: Vascular;  Laterality: Right;   INSERTION OF DIALYSIS CATHETER Right 09/24/2020   Procedure: INSERTION OF TUNNELED DIALYSIS CATHETER;  Surgeon: Rosetta Posner, MD;  Location: [place];  Service: Vascular;  Laterality: Right;   IR FLUORO GUIDE CV LINE RIGHT  09/22/2020   IR THORACENTESIS ASP PLEURAL SPACE W/IMG GUIDE  05/19/2019   IR US GUIDE VASC ACCESS LEFT  09/22/2020   IR US GUIDE VASC ACCESS RIGHT  09/22/2020   IR VENOCAVAGRAM SVC  09/22/2020   LAMINECTOMY  05/27/2009   Lumbar decompressive laminectomy, fusion and plating for lumbar spinal stensosis   LIGATION OF ARTERIOVENOUS  FISTULA Left 09/24/2020   Procedure: LIGATION OF LEFT ARM ARTERIOVENOUS  FISTULA;  Surgeon: Rosetta Posner, MD;  Location: [place];  Service: Vascular;  Laterality: Left;   LUMBAR LAMINECTOMY/DECOMPRESSION MICRODISCECTOMY Left 03/23/2013   Procedure: LUMBAR LAMINECTOMY/DECOMPRESSION MICRODISCECTOMY 1 LEVEL;  Surgeon: Eustace Moore, MD;  Location: [facility];  Service: Neurosurgery;  Laterality: Left;  LUMBAR LAMINECTOMY/DECOMPRESSION MICRODISCECTOMY 1 LEVEL   MASTECTOMY, PARTIAL Left 02/26/2003   ; S/P re-excision of cranial and lateral margins 04/19/2003.    RE-EXCISION OF BREAST CANCER,SUPERIOR MARGINS Right 10/27/2012   Procedure: RE-EXCISION OF BREAST CANCER,SUPERIOR and inferior MARGINS;  Surgeon: [name], MD;  Location: [place];  Service: General;  Laterality: Right;   RE-EXCISION OF BREAST LUMPECTOMY Left 04/2003   TEE WITHOUT CARDIOVERSION N/A 04/04/2019   Procedure: Transesophageal Echocardiogram (Tee);  Surgeon: Wonda Olds, MD;  Location: [place];  Service: Open Heart Surgery;  Laterality: N/A;   THORACIC AORTIC ANEURYSM REPAIR N/A 04/04/2019   Procedure: THORACIC ASCENDING ANEURYSM REPAIR (AAA)  USING 28 MM X 30 CM HEMASHIELD  PLATINUM VASCULAR GRAFT;  Surgeon: Wonda Olds, MD;  Location: [facility];  Service: Open Heart Surgery;  Laterality: N/A;   Family History  Problem Relation Age of Onset   Colon cancer Mother 74   Hypertension Mother    Diabetes Sister 96   Hypertension Sister    Diabetes Brother    Hypertension Brother    Diabetes Brother    Hypertension Brother    Kidney disease Son        s/p renal transplant   Hypertension Son    Diabetes Son    Multiple sclerosis Son    Bone cancer Sister 68   Breast cancer Neg Hx    Cervical cancer Neg Hx    Social History:  reports that she has been smoking cigarettes. She has a 11.00 pack-year smoking history. She has never used smokeless tobacco. She reports that she does not currently use alcohol after a past usage of about 2.0 standard drinks of alcohol per week. She reports that she does not currently use drugs after having used the following drugs: Cocaine. Allergies  Allergen Reactions   Shrimp [Shellfish Allergy] Shortness Of Breath   Bactroban [Mupirocin] Other (See Comments)    "Sores in nose"   Tylenol [Acetaminophen] Itching   Zestril [Lisinopril] Cough   Prior to Admission medications   Medication Sig Start Date End Date Taking? Authorizing Provider  albuterol (VENTOLIN  HFA) 108 (90 Base) MCG/ACT inhaler Inhale 2 puffs into the lungs every 6 (six) hours as needed for wheezing or shortness of breath. 07/30/20  Yes Maximiano Coss, NP  amLODipine (NORVASC) 10 MG tablet Take 1 tablet (10 mg total) by mouth daily. 01/10/22  Yes Charlynne Cousins, MD  apixaban (ELIQUIS) 2.5 MG TABS tablet Take 1 tablet (2.5 mg total) by mouth 2 (two) times daily. 08/24/21  Yes Paige, Weldon Picking, DO  carvedilol (COREG) 6.25 MG tablet Take 1 tablet (6.25 mg total) by mouth 2 (two) times daily with a meal. 01/10/22  Yes Charlynne Cousins, MD  Cholecalciferol (VITAMIN D-3 PO) Take 1 capsule by mouth daily.   Yes [provider]  cloNIDine (CATAPRES - DOSED  IN MG/24 HR) 0.2 mg/24hr patch Place 1 patch (0.2 mg total) onto the skin every Saturday. 01/10/22  Yes Charlynne Cousins, MD  hydrALAZINE (APRESOLINE) 50 MG tablet Take 1 tablet (50 mg total) by mouth 3 (three) times daily. 01/10/22 02/23/22 Yes Charlynne Cousins, MD  isosorbide mononitrate (IMDUR) 30 MG 24 hr tablet Take 2 tablets (60 mg total) by mouth daily. Patient taking differently: Take 30 mg by mouth daily. 01/10/22  Yes Charlynne Cousins, MD  losartan (COZAAR) 100 MG tablet Take 1 tablet (100 mg total) by mouth daily. 02/14/21  Yes Maximiano Coss, NP  mirtazapine (REMERON) 7.5 MG tablet Take 1 tablet (7.5 mg total) by mouth at bedtime. Patient taking differently: Take 7.5 mg by mouth at bedtime as needed (sleep). 07/15/20  Yes Maximiano Coss, NP  rosuvastatin (CRESTOR) 10 MG tablet Take 10 mg by mouth daily.   Yes [provider]  sevelamer carbonate (RENVELA) 2.4 g PACK 2.4 g with breakfast, with lunch, and with evening meal.   Yes [provider]  torsemide 40 MG TABS Take 40 mg by mouth 2 (two) times daily. 01/10/22  Yes Charlynne Cousins, MD   Current Facility-Administered Medications  Medication Dose Route Frequency Provider Last Rate Last Admin    stroke: early stages of recovery book   Does not apply Once Lequita Halt, MD       acetaminophen (TYLENOL) tablet 650 mg  650 mg Oral Q4H PRN Lequita Halt, MD   650 mg at 02/24/22 0222   Or   acetaminophen (TYLENOL) 160 MG/5ML solution 650 mg  650 mg Per Tube Q4H PRN Lequita Halt, MD       Or   acetaminophen (TYLENOL) suppository 650 mg  650 mg Rectal Q4H PRN Lequita Halt, MD       albuterol (PROVENTIL) (2.5 MG/3ML) 0.083% nebulizer solution 2.5 mg  2.5 mg Nebulization Q6H PRN Wynetta Fines T, MD       amLODipine (NORVASC) tablet 10 mg  10 mg Oral Daily Darliss Cheney, MD       apixaban (ELIQUIS) tablet 2.5 mg  2.5 mg Oral BID Darliss Cheney, MD       aspirin suppository 300 mg  300 mg Rectal Once Elgie Congo, MD       carvedilol (COREG) tablet 6.25 mg  6.25 mg Oral BID WC Pahwani, Einar Grad, MD       Chlorhexidine Gluconate Cloth 2 % PADS 6 each  6 each Topical Daily Darliss Cheney, MD       [START ON 02/28/2022] cloNIDine (CATAPRES - Dosed in mg/24 hr) patch 0.1 mg  0.1 mg Transdermal Q Sat Lequita Halt, MD       hydrALAZINE (  APRESOLINE) injection 10 mg  10 mg Intravenous Q6H PRN Shalhoub, Sherryll Burger, MD       labetalol (NORMODYNE) injection 20 mg  20 mg Intravenous Q2H PRN Shalhoub, Sherryll Burger, MD       LORazepam (ATIVAN) injection 0.5 mg  0.5 mg Intravenous Q4H PRN Wynetta Fines T, MD       rosuvastatin (CRESTOR) tablet 10 mg  10 mg Oral Daily Darliss Cheney, MD       Labs: Basic Metabolic Panel: Recent Labs  Lab 02/23/22 1444 02/23/22 1846 02/24/22 0411  NA 141  --  140  K 5.9* 4.0 4.0  CL 102  --  105  CO2 26  --  24  GLUCOSE 93  --  149*  BUN 42*  --  47*  CREATININE 8.96*  --  10.03*  CALCIUM 10.5*  --  9.6   Liver Function Tests: Recent Labs  Lab 02/23/22 1444  AST 44*  ALT 12  ALKPHOS 78  BILITOT 0.6  PROT 7.5  ALBUMIN 4.1   No results for input(s): "LIPASE", "AMYLASE" in the last 168 hours. No results for input(s): "AMMONIA" in the last 168 hours. CBC: Recent Labs  Lab 02/23/22 1846  WBC 4.4  NEUTROABS 2.8  HGB 11.8*  HCT 38.7  MCV 101.6*  PLT 284   Cardiac Enzymes: No results for input(s): "CKTOTAL", "CKMB", "CKMBINDEX", "TROPONINI" in the last 168 hours. CBG: Recent Labs  Lab 02/23/22 1357 02/23/22 1755  GLUCAP 106* 103*   Iron Studies: No results for input(s): "IRON", "TIBC", "TRANSFERRIN", "FERRITIN" in the last 72 hours. Studies/Results: DG CHEST PORT 1 VIEW  Result Date: 02/24/2022 CLINICAL DATA:  Congestive heart failure. EXAM: PORTABLE CHEST 1 VIEW COMPARISON:  February 23, 2022. FINDINGS: Stable cardiomegaly. Right internal jugular Port-A-Cath is unchanged in position. Lungs are clear. Bony thorax is unremarkable. IMPRESSION: No active  disease. Electronically Signed   By: Marijo Conception M.D.   On: 02/24/2022 08:59   MR BRAIN WO CONTRAST  Result Date: 02/23/2022 CLINICAL DATA:  Stroke-like symptoms; right upper extremity weakness, right facial droop, dysarthria EXAM: MRI HEAD WITHOUT CONTRAST MRA HEAD WITHOUT CONTRAST MRA NECK WITHOUT CONTRAST TECHNIQUE: Multiplanar, multi-echo pulse sequences of the brain and surrounding structures were acquired without intravenous contrast. Angiographic images of the Circle of Willis were acquired using MRA technique without intravenous contrast. Angiographic images of the neck were acquired using MRA technique withoutintravenous contrast. Carotid stenosis measurements (when applicable) are obtained utilizing NASCET criteria, using the distal internal carotid diameter as the denominator. COMPARISON:  MRI/MRA head 05/21/2019, correlation is made with CT head 02/23/2022 FINDINGS: MRI HEAD FINDINGS Brain: Cortical restricted diffusion with ADC correlate in the left posterior MCA territory, involving the left posterior frontal and anterior parietal lobes (series 5, image 87 and series 7, image 51), including the central sulcus. Focal restricted diffusion with ADC correlate in the left caudate head (series 5, image 78). Smaller foci of restricted diffusion with ADC correlates in the left occipital lobe cortex and white matter (series 5, images 67, 68, 78), left caudate taill(series 5, image 78), and left frontal lobe (series 5, image 86). These areas are associated with increased T2 hyperintense signal. No acute hemorrhage, mass, mass effect, or midline shift. Scattered foci susceptibility in the bilateral cerebral hemispheres and deep gray structures, likely sequela of prior infarcts and microhemorrhages. Confluent T2 hyperintense signal in the periventricular white matter, likely the sequela of severe chronic small vessel ischemic disease. No hydrocephalus or extra-axial collection. Vascular:  Please see MRA  findings below. Skull and upper cervical spine: Normal marrow signal. Sinuses/Orbits: No acute finding. Other: The mastoids are well aerated. MRA HEAD FINDINGS Anterior circulation: Both internal carotid arteries are patent to the termini, without significant stenosis. A1 segments patent. Normal anterior communicating artery. Anterior cerebral arteries are patent to their distal aspects. No M1 stenosis or occlusion. Normal MCA bifurcations. Distal MCA branches perfused and symmetric. Posterior circulation: Evaluation of the proximal vertebral arteries is limited by motion artifact. Vertebral arteries otherwise patent to the vertebrobasilar junction without stenosis. Basilar patent to its distal aspect. Superior cerebellar arteries patent bilaterally. Patent P1 segments. PCAs perfused to their distal aspects without stenosis. The bilateral posterior communicating arteries are not visualized. Anatomic variants: None significant MRA NECK FINDINGS Evaluation is somewhat limited by motion artifact. No evidence of dissection, occlusion, or hemodynamically significant stenosis (greater than 50%). Suspect beading of the right-greater-than-left distal ICAs bilaterally (series 6, images 181-224 on the right and 216-228 on the left). IMPRESSION: 1. 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 Additional focal acute to subacute infarct involving the left caudate head 2. Additional smaller acute to subacute infarcts involving the left caudate tail, occipital lobe, and frontal lobe. 3. No definite hemodynamically significant stenosis in the neck. Possible beading of the right-greater-than-left distal ICAs, concerning for fibromuscular dysplasia. This could be better evaluated with a CTA of the neck if clinically indicated. 4. No intracranial large vessel occlusion or significant stenosis. These results were called by telephone at the time of  interpretation on 02/23/2022 at 9:24 pm to provider Dr. Marlyce Huge, who verbally acknowledged these results. Electronically Signed   By: Merilyn Baba M.D.   On: 02/23/2022 21:24   MR ANGIO HEAD WO CONTRAST  Result Date: 02/23/2022 CLINICAL DATA:  Stroke-like symptoms; right upper extremity weakness, right facial droop, dysarthria EXAM: MRI HEAD WITHOUT CONTRAST MRA HEAD WITHOUT CONTRAST MRA NECK WITHOUT CONTRAST TECHNIQUE: Multiplanar, multi-echo pulse sequences of the brain and surrounding structures were acquired without intravenous contrast. Angiographic images of the Circle of Willis were acquired using MRA technique without intravenous contrast. Angiographic images of the neck were acquired using MRA technique withoutintravenous contrast. Carotid stenosis measurements (when applicable) are obtained utilizing NASCET criteria, using the distal internal carotid diameter as the denominator. COMPARISON:  MRI/MRA head 05/21/2019, correlation is made with CT head 02/23/2022 FINDINGS: MRI HEAD FINDINGS Brain: Cortical restricted diffusion with ADC correlate in the left posterior MCA territory, involving the left posterior frontal and anterior parietal lobes (series 5, image 87 and series 7, image 51), including the central sulcus. Focal restricted diffusion with ADC correlate in the left caudate head (series 5, image 78). Smaller foci of restricted diffusion with ADC correlates in the left occipital lobe cortex and white matter (series 5, images 67, 68, 78), left caudate taill(series 5, image 78), and left frontal lobe (series 5, image 86). These areas are associated with increased T2 hyperintense signal. No acute hemorrhage, mass, mass effect, or midline shift. Scattered foci susceptibility in the bilateral cerebral hemispheres and deep gray structures, likely sequela of prior infarcts and microhemorrhages. Confluent T2 hyperintense signal in the periventricular white matter, likely the sequela of severe chronic  small vessel ischemic disease. No hydrocephalus or extra-axial collection. Vascular: Please see MRA findings below. Skull and upper cervical spine: Normal marrow signal. Sinuses/Orbits: No acute finding. Other: The mastoids are well aerated. MRA HEAD FINDINGS Anterior circulation: Both internal carotid arteries are patent  to the termini, without significant stenosis. A1 segments patent. Normal anterior communicating artery. Anterior cerebral arteries are patent to their distal aspects. No M1 stenosis or occlusion. Normal MCA bifurcations. Distal MCA branches perfused and symmetric. Posterior circulation: Evaluation of the proximal vertebral arteries is limited by motion artifact. Vertebral arteries otherwise patent to the vertebrobasilar junction without stenosis. Basilar patent to its distal aspect. Superior cerebellar arteries patent bilaterally. Patent P1 segments. PCAs perfused to their distal aspects without stenosis. The bilateral posterior communicating arteries are not visualized. Anatomic variants: None significant MRA NECK FINDINGS Evaluation is somewhat limited by motion artifact. No evidence of dissection, occlusion, or hemodynamically significant stenosis (greater than 50%). Suspect beading of the right-greater-than-left distal ICAs bilaterally (series 6, images 181-224 on the right and 216-228 on the left). IMPRESSION: 1. 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 Additional focal acute to subacute infarct involving the left caudate head 2. Additional smaller acute to subacute infarcts involving the left caudate tail, occipital lobe, and frontal lobe. 3. No definite hemodynamically significant stenosis in the neck. Possible beading of the right-greater-than-left distal ICAs, concerning for fibromuscular dysplasia. This could be better evaluated with a CTA of the neck if clinically indicated. 4. No intracranial large vessel  occlusion or significant stenosis. These results were called by telephone at the time of interpretation on 02/23/2022 at 9:24 pm to provider Dr. Marlyce Huge, who verbally acknowledged these results. Electronically Signed   By: Merilyn Baba M.D.   On: 02/23/2022 21:24   MR ANGIO NECK WO CONTRAST  Result Date: 02/23/2022 CLINICAL DATA:  Stroke-like symptoms; right upper extremity weakness, right facial droop, dysarthria EXAM: MRI HEAD WITHOUT CONTRAST MRA HEAD WITHOUT CONTRAST MRA NECK WITHOUT CONTRAST TECHNIQUE: Multiplanar, multi-echo pulse sequences of the brain and surrounding structures were acquired without intravenous contrast. Angiographic images of the Circle of Willis were acquired using MRA technique without intravenous contrast. Angiographic images of the neck were acquired using MRA technique withoutintravenous contrast. Carotid stenosis measurements (when applicable) are obtained utilizing NASCET criteria, using the distal internal carotid diameter as the denominator. COMPARISON:  MRI/MRA head 05/21/2019, correlation is made with CT head 02/23/2022 FINDINGS: MRI HEAD FINDINGS Brain: Cortical restricted diffusion with ADC correlate in the left posterior MCA territory, involving the left posterior frontal and anterior parietal lobes (series 5, image 87 and series 7, image 51), including the central sulcus. Focal restricted diffusion with ADC correlate in the left caudate head (series 5, image 78). Smaller foci of restricted diffusion with ADC correlates in the left occipital lobe cortex and white matter (series 5, images 67, 68, 78), left caudate taill(series 5, image 78), and left frontal lobe (series 5, image 86). These areas are associated with increased T2 hyperintense signal. No acute hemorrhage, mass, mass effect, or midline shift. Scattered foci susceptibility in the bilateral cerebral hemispheres and deep gray structures, likely sequela of prior infarcts and microhemorrhages. Confluent T2  hyperintense signal in the periventricular white matter, likely the sequela of severe chronic small vessel ischemic disease. No hydrocephalus or extra-axial collection. Vascular: Please see MRA findings below. Skull and upper cervical spine: Normal marrow signal. Sinuses/Orbits: No acute finding. Other: The mastoids are well aerated. MRA HEAD FINDINGS Anterior circulation: Both internal carotid arteries are patent to the termini, without significant stenosis. A1 segments patent. Normal anterior communicating artery. Anterior cerebral arteries are patent to their distal aspects. No M1 stenosis or occlusion. Normal MCA bifurcations. Distal MCA branches perfused and  symmetric. Posterior circulation: Evaluation of the proximal vertebral arteries is limited by motion artifact. Vertebral arteries otherwise patent to the vertebrobasilar junction without stenosis. Basilar patent to its distal aspect. Superior cerebellar arteries patent bilaterally. Patent P1 segments. PCAs perfused to their distal aspects without stenosis. The bilateral posterior communicating arteries are not visualized. Anatomic variants: None significant MRA NECK FINDINGS Evaluation is somewhat limited by motion artifact. No evidence of dissection, occlusion, or hemodynamically significant stenosis (greater than 50%). Suspect beading of the right-greater-than-left distal ICAs bilaterally (series 6, images 181-224 on the right and 216-228 on the left). IMPRESSION: 1. 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 Additional focal acute to subacute infarct involving the left caudate head 2. Additional smaller acute to subacute infarcts involving the left caudate tail, occipital lobe, and frontal lobe. 3. No definite hemodynamically significant stenosis in the neck. Possible beading of the right-greater-than-left distal ICAs, concerning for fibromuscular dysplasia. This could be  better evaluated with a CTA of the neck if clinically indicated. 4. No intracranial large vessel occlusion or significant stenosis. These results were called by telephone at the time of interpretation on 02/23/2022 at 9:24 pm to provider Dr. Marlyce Huge, who verbally acknowledged these results. Electronically Signed   By: Merilyn Baba M.D.   On: 02/23/2022 21:24   DG Chest Portable 1 View  Result Date: 02/23/2022 CLINICAL DATA:  Stroke EXAM: PORTABLE CHEST 1 VIEW COMPARISON:  Radiograph 01/29/2022 FINDINGS: Unchanged enlarged cardiomediastinal silhouette with prior median sternotomy. Right neck catheter tip overlies the right atrium. There is no focal airspace consolidation. There is no pleural effusion. No pneumothorax. No acute osseous abnormality. IMPRESSION: Unchanged cardiomegaly.  No new airspace disease. Electronically Signed   By: Maurine Simmering M.D.   On: 02/23/2022 15:06   CT Head Wo Contrast  Result Date: 02/23/2022 CLINICAL DATA:  RUE weakness, right facial droop, dysarthria EXAM: CT HEAD WITHOUT CONTRAST TECHNIQUE: Contiguous axial images were obtained from the base of the skull through the vertex without intravenous contrast. RADIATION DOSE REDUCTION: This exam was performed according to the departmental dose-optimization program which includes automated exposure control, adjustment of the mA and/or kV according to patient size and/or use of iterative reconstruction technique. COMPARISON:  CT head 12/24/2021. FINDINGS: Brain: Subtle high left frontoparietal hypoattenuation and possible loss of gray differentiation (for example see series 2, image 19 and series 5, image 33). No evidence of acute hemorrhage, mass lesion midline shift or hydrocephalus. Patchy white matter hypoattenuation, nonspecific but compatible with chronic microvascular disease. Vascular: Calcific atherosclerosis. No convincing hyperdense vessel identified. Skull: No acute fracture. Sinuses/Orbits: Clear sinuses.  No acute orbital  findings. Other: No mastoid effusions. Dr. Leonie Man paged at the time of dictation for call of report. IMPRESSION: 1. Subtle high left frontoparietal hypoattenuation and possible loss of gray differentiation, which could represent acute infarct (particularly given the clinical history) or artifact. Recommend MRI. 2. No acute hemorrhage. Electronically Signed   By: Margaretha Sheffield M.D.   On: 02/23/2022 14:32    ROS: As per HPI otherwise negative.   Physical Exam: Vitals:   02/24/22 0048 02/24/22 0254 02/24/22 0413 02/24/22 0757  BP: (!) 173/135 (!) 181/130 (!) 191/117 (!) 193/111  Pulse: 93 82 85 91  Resp: '18 20 18 17  '$ Temp: 98.6 F (37 C) 97.8 F (36.6 C) 98.6 F (37 C) 98.6 F (37 C)  TempSrc: Oral Oral Oral Oral  SpO2: 100% 98% 99% 100%  General: Frail, appearing female looks older than stated age.  Head: Normocephalic, atraumatic, sclera non-icteric, mucus membranes are moist Neck: Supple. JVD not elevated. Lungs: Clear bilaterally to auscultation without wheezes, rales, or rhonchi. Breathing is unlabored. Heart: S1,S2 +S4. SR on monitor HR 90s.  Abdomen: Soft, non-tender, non-distended with normoactive bowel sounds. No rebound/guarding. No obvious abdominal masses. Lower extremities: No LE edema. Edema noted R hand.  Neuro: Alert, oriented to person and place. Moves all extremities except RUE.  Psych:  Responds to questions appropriately with a normal affect. Dialysis Access: RIJ TDC Drsg intact.   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 Neurology consulted. Per primary. Hypertensive Emergency: Select home medications have been resumed. Historically BP has been well controlled while in hospital. H/O nonadherence to BP meds. Per neuro, allowing permissive HTN, treat BP if SBP > 220.   PAF-Eliquis on hold. Per primary.   ESRD -  T,Th, S. HD todoay on schedule.   Hypertension/volume  - HTN as noted above. No excess volume by exam or chest xray. UF as tolerated.   Anemia  - HGB 11.8. No ESA needed.   Metabolic bone disease -PO4 elevated at OP clinic. Resume binders, VDRA.   Nutrition - Albumin at goal. Renal diet.  Afnan Emberton H. Owens Shark, NP-C 02/24/2022, 9:04 AM  [employer] [phone]

## 2022-02-24 NOTE — Progress Notes (Signed)
HOSPITAL MEDICINE OVERNIGHT EVENT NOTE    Notified by Dr. Lennon Alstrom with radiology as to the results of patient's MRI of the brain which reveals acute/subacute cortical infarct of the left posterior frontal and anterior parietal lobes including the central sulcus with additional focal acute/subacute infarct involving the caudate head and furthermore, additional smaller acute to subacute infarct involving the left caudate tail occipital lobe and frontal lobe all concerning for a embolic process.  Patient is already being managed for stroke with antiplatelet therapy and permissive hypertension.  Neurology is currently following.  Dr. Lennon Alstrom did feel that a CT angiogram of the neck could be performed if better imaging was felt to be indicated.  We will defer the decision on this to neurology in the morning.  Continue to monitor patient closely with telemetry, serial neurologic checks and permissive hypertension overnight.  Monique Emerald  MD [facility]

## 2022-02-24 NOTE — Procedures (Signed)
Patient seen and examined on Hemodialysis. BP (!) 195/116   Pulse 79   Temp 98.3 F (36.8 C) (Oral)   Resp 20   Wt 48.9 kg   SpO2 100%   BMI 18.50 kg/m   QB 400 mL/ min via TDC, UF goal 3L  Tolerating treatment without complaints at this time.  BP is high and expecting to improve with UF   Madelon Lips MD [facility] [phone] 3:09 PM

## 2022-02-24 NOTE — Progress Notes (Signed)
Inpatient Rehab Admissions Coordinator:    I spoke with Pt.'s son, [name], regarding potential CIR admit. He is interested but not sure if family will be able to pull together 24/7 support at discharge. He will talk to pt.'s sister and get back with me tomorrow. I will continue to follow for potential admit pending confirmation of dispo and insurance auth.   Clemens Catholic, [place], [place] Admissions Coordinator  [phone] ([place]) [phone] (office)

## 2022-02-24 NOTE — Progress Notes (Addendum)
STROKE TEAM PROGRESS NOTE   INTERVAL HISTORY Patient is seen in her room with no family at the bedside.  Yesterday, she experienced right upper extremity weakness, right facial droop and slurred speech upon awakening.  Upon further questioning, it was revealed that these deficits actually began the night before.  Patient is taking Eliquis at home.MRI scan of the brain shows acute to subacute left posterior frontal and anterior parietal infarcts along with subacute left caudate head infarct.  MR angiogram of the neck and brain showed no large vessel stenosis.  Concern about possible fibromuscular dysplasia involving distal ICAs but no evidence of hemodynamically significant stenosis  Vitals:   02/24/22 1238 02/24/22 1245 02/24/22 1302 02/24/22 1330  BP: (!) 185/112  (!) 184/111 (!) 192/113  Pulse: 90  80 82  Resp: (!) 21  (!) 27 20  Temp: 98.3 F (36.8 C)     TempSrc: Oral     SpO2: 100%  100% 100%  Weight:  48.9 kg     CBC:  Recent Labs  Lab 02/23/22 1846  WBC 4.4  NEUTROABS 2.8  HGB 11.8*  HCT 38.7  MCV 101.6*  PLT 379   Basic Metabolic Panel:  Recent Labs  Lab 02/23/22 1444 02/23/22 1846 02/24/22 0411  NA 141  --  140  K 5.9* 4.0 4.0  CL 102  --  105  CO2 26  --  24  GLUCOSE 93  --  149*  BUN 42*  --  47*  CREATININE 8.96*  --  10.03*  CALCIUM 10.5*  --  9.6   Lipid Panel:  Recent Labs  Lab 02/24/22 0411  CHOL 104  TRIG 43  HDL 49  CHOLHDL 2.1  VLDL 9  LDLCALC 46   HgbA1c:  Recent Labs  Lab 02/23/22 1846  HGBA1C 4.7*   Urine Drug Screen: No results for input(s): "LABOPIA", "COCAINSCRNUR", "LABBENZ", "AMPHETMU", "THCU", "LABBARB" in the last 168 hours.  Alcohol Level No results for input(s): "ETH" in the last 168 hours.  IMAGING past 24 hours DG CHEST PORT 1 VIEW  Result Date: 02/24/2022 CLINICAL DATA:  Congestive heart failure. EXAM: PORTABLE CHEST 1 VIEW COMPARISON:  February 23, 2022. FINDINGS: Stable cardiomegaly. Right internal jugular  Port-A-Cath is unchanged in position. Lungs are clear. Bony thorax is unremarkable. IMPRESSION: No active disease. Electronically Signed   By: Marijo Conception M.D.   On: 02/24/2022 08:59   MR BRAIN WO CONTRAST  Result Date: 02/23/2022 CLINICAL DATA:  Stroke-like symptoms; right upper extremity weakness, right facial droop, dysarthria EXAM: MRI HEAD WITHOUT CONTRAST MRA HEAD WITHOUT CONTRAST MRA NECK WITHOUT CONTRAST TECHNIQUE: Multiplanar, multi-echo pulse sequences of the brain and surrounding structures were acquired without intravenous contrast. Angiographic images of the Circle of Willis were acquired using MRA technique without intravenous contrast. Angiographic images of the neck were acquired using MRA technique withoutintravenous contrast. Carotid stenosis measurements (when applicable) are obtained utilizing NASCET criteria, using the distal internal carotid diameter as the denominator. COMPARISON:  MRI/MRA head 05/21/2019, correlation is made with CT head 02/23/2022 FINDINGS: MRI HEAD FINDINGS Brain: Cortical restricted diffusion with ADC correlate in the left posterior MCA territory, involving the left posterior frontal and anterior parietal lobes (series 5, image 87 and series 7, image 51), including the central sulcus. Focal restricted diffusion with ADC correlate in the left caudate head (series 5, image 78). Smaller foci of restricted diffusion with ADC correlates in the left occipital lobe cortex and white matter (series 5, images 67, 68, 78),  left caudate taill(series 5, image 78), and left frontal lobe (series 5, image 86). These areas are associated with increased T2 hyperintense signal. No acute hemorrhage, mass, mass effect, or midline shift. Scattered foci susceptibility in the bilateral cerebral hemispheres and deep gray structures, likely sequela of prior infarcts and microhemorrhages. Confluent T2 hyperintense signal in the periventricular white matter, likely the sequela of severe  chronic small vessel ischemic disease. No hydrocephalus or extra-axial collection. Vascular: Please see MRA findings below. Skull and upper cervical spine: Normal marrow signal. Sinuses/Orbits: No acute finding. Other: The mastoids are well aerated. MRA HEAD FINDINGS Anterior circulation: Both internal carotid arteries are patent to the termini, without significant stenosis. A1 segments patent. Normal anterior communicating artery. Anterior cerebral arteries are patent to their distal aspects. No M1 stenosis or occlusion. Normal MCA bifurcations. Distal MCA branches perfused and symmetric. Posterior circulation: Evaluation of the proximal vertebral arteries is limited by motion artifact. Vertebral arteries otherwise patent to the vertebrobasilar junction without stenosis. Basilar patent to its distal aspect. Superior cerebellar arteries patent bilaterally. Patent P1 segments. PCAs perfused to their distal aspects without stenosis. The bilateral posterior communicating arteries are not visualized. Anatomic variants: None significant MRA NECK FINDINGS Evaluation is somewhat limited by motion artifact. No evidence of dissection, occlusion, or hemodynamically significant stenosis (greater than 50%). Suspect beading of the right-greater-than-left distal ICAs bilaterally (series 6, images 181-224 on the right and 216-228 on the left). IMPRESSION: 1. 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 Additional focal acute to subacute infarct involving the left caudate head 2. Additional smaller acute to subacute infarcts involving the left caudate tail, occipital lobe, and frontal lobe. 3. No definite hemodynamically significant stenosis in the neck. Possible beading of the right-greater-than-left distal ICAs, concerning for fibromuscular dysplasia. This could be better evaluated with a CTA of the neck if clinically indicated. 4. No intracranial large  vessel occlusion or significant stenosis. These results were called by telephone at the time of interpretation on 02/23/2022 at 9:24 pm to provider Dr. Marlyce Huge, who verbally acknowledged these results. Electronically Signed   By: Merilyn Baba M.D.   On: 02/23/2022 21:24   MR ANGIO HEAD WO CONTRAST  Result Date: 02/23/2022 CLINICAL DATA:  Stroke-like symptoms; right upper extremity weakness, right facial droop, dysarthria EXAM: MRI HEAD WITHOUT CONTRAST MRA HEAD WITHOUT CONTRAST MRA NECK WITHOUT CONTRAST TECHNIQUE: Multiplanar, multi-echo pulse sequences of the brain and surrounding structures were acquired without intravenous contrast. Angiographic images of the Circle of Willis were acquired using MRA technique without intravenous contrast. Angiographic images of the neck were acquired using MRA technique withoutintravenous contrast. Carotid stenosis measurements (when applicable) are obtained utilizing NASCET criteria, using the distal internal carotid diameter as the denominator. COMPARISON:  MRI/MRA head 05/21/2019, correlation is made with CT head 02/23/2022 FINDINGS: MRI HEAD FINDINGS Brain: Cortical restricted diffusion with ADC correlate in the left posterior MCA territory, involving the left posterior frontal and anterior parietal lobes (series 5, image 87 and series 7, image 51), including the central sulcus. Focal restricted diffusion with ADC correlate in the left caudate head (series 5, image 78). Smaller foci of restricted diffusion with ADC correlates in the left occipital lobe cortex and white matter (series 5, images 67, 68, 78), left caudate taill(series 5, image 78), and left frontal lobe (series 5, image 86). These areas are associated with increased T2 hyperintense signal. No acute hemorrhage, mass, mass effect, or midline shift. Scattered foci susceptibility  in the bilateral cerebral hemispheres and deep gray structures, likely sequela of prior infarcts and microhemorrhages. Confluent T2  hyperintense signal in the periventricular white matter, likely the sequela of severe chronic small vessel ischemic disease. No hydrocephalus or extra-axial collection. Vascular: Please see MRA findings below. Skull and upper cervical spine: Normal marrow signal. Sinuses/Orbits: No acute finding. Other: The mastoids are well aerated. MRA HEAD FINDINGS Anterior circulation: Both internal carotid arteries are patent to the termini, without significant stenosis. A1 segments patent. Normal anterior communicating artery. Anterior cerebral arteries are patent to their distal aspects. No M1 stenosis or occlusion. Normal MCA bifurcations. Distal MCA branches perfused and symmetric. Posterior circulation: Evaluation of the proximal vertebral arteries is limited by motion artifact. Vertebral arteries otherwise patent to the vertebrobasilar junction without stenosis. Basilar patent to its distal aspect. Superior cerebellar arteries patent bilaterally. Patent P1 segments. PCAs perfused to their distal aspects without stenosis. The bilateral posterior communicating arteries are not visualized. Anatomic variants: None significant MRA NECK FINDINGS Evaluation is somewhat limited by motion artifact. No evidence of dissection, occlusion, or hemodynamically significant stenosis (greater than 50%). Suspect beading of the right-greater-than-left distal ICAs bilaterally (series 6, images 181-224 on the right and 216-228 on the left). IMPRESSION: 1. 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 Additional focal acute to subacute infarct involving the left caudate head 2. Additional smaller acute to subacute infarcts involving the left caudate tail, occipital lobe, and frontal lobe. 3. No definite hemodynamically significant stenosis in the neck. Possible beading of the right-greater-than-left distal ICAs, concerning for fibromuscular dysplasia. This could be  better evaluated with a CTA of the neck if clinically indicated. 4. No intracranial large vessel occlusion or significant stenosis. These results were called by telephone at the time of interpretation on 02/23/2022 at 9:24 pm to provider Dr. Marlyce Huge, who verbally acknowledged these results. Electronically Signed   By: Merilyn Baba M.D.   On: 02/23/2022 21:24   MR ANGIO NECK WO CONTRAST  Result Date: 02/23/2022 CLINICAL DATA:  Stroke-like symptoms; right upper extremity weakness, right facial droop, dysarthria EXAM: MRI HEAD WITHOUT CONTRAST MRA HEAD WITHOUT CONTRAST MRA NECK WITHOUT CONTRAST TECHNIQUE: Multiplanar, multi-echo pulse sequences of the brain and surrounding structures were acquired without intravenous contrast. Angiographic images of the Circle of Willis were acquired using MRA technique without intravenous contrast. Angiographic images of the neck were acquired using MRA technique withoutintravenous contrast. Carotid stenosis measurements (when applicable) are obtained utilizing NASCET criteria, using the distal internal carotid diameter as the denominator. COMPARISON:  MRI/MRA head 05/21/2019, correlation is made with CT head 02/23/2022 FINDINGS: MRI HEAD FINDINGS Brain: Cortical restricted diffusion with ADC correlate in the left posterior MCA territory, involving the left posterior frontal and anterior parietal lobes (series 5, image 87 and series 7, image 51), including the central sulcus. Focal restricted diffusion with ADC correlate in the left caudate head (series 5, image 78). Smaller foci of restricted diffusion with ADC correlates in the left occipital lobe cortex and white matter (series 5, images 67, 68, 78), left caudate taill(series 5, image 78), and left frontal lobe (series 5, image 86). These areas are associated with increased T2 hyperintense signal. No acute hemorrhage, mass, mass effect, or midline shift. Scattered foci susceptibility in the bilateral cerebral hemispheres and  deep gray structures, likely sequela of prior infarcts and microhemorrhages. Confluent T2 hyperintense signal in the periventricular white matter, likely the sequela of severe chronic small vessel ischemic  disease. No hydrocephalus or extra-axial collection. Vascular: Please see MRA findings below. Skull and upper cervical spine: Normal marrow signal. Sinuses/Orbits: No acute finding. Other: The mastoids are well aerated. MRA HEAD FINDINGS Anterior circulation: Both internal carotid arteries are patent to the termini, without significant stenosis. A1 segments patent. Normal anterior communicating artery. Anterior cerebral arteries are patent to their distal aspects. No M1 stenosis or occlusion. Normal MCA bifurcations. Distal MCA branches perfused and symmetric. Posterior circulation: Evaluation of the proximal vertebral arteries is limited by motion artifact. Vertebral arteries otherwise patent to the vertebrobasilar junction without stenosis. Basilar patent to its distal aspect. Superior cerebellar arteries patent bilaterally. Patent P1 segments. PCAs perfused to their distal aspects without stenosis. The bilateral posterior communicating arteries are not visualized. Anatomic variants: None significant MRA NECK FINDINGS Evaluation is somewhat limited by motion artifact. No evidence of dissection, occlusion, or hemodynamically significant stenosis (greater than 50%). Suspect beading of the right-greater-than-left distal ICAs bilaterally (series 6, images 181-224 on the right and 216-228 on the left). IMPRESSION: 1. 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 Additional focal acute to subacute infarct involving the left caudate head 2. Additional smaller acute to subacute infarcts involving the left caudate tail, occipital lobe, and frontal lobe. 3. No definite hemodynamically significant stenosis in the neck. Possible beading of the  right-greater-than-left distal ICAs, concerning for fibromuscular dysplasia. This could be better evaluated with a CTA of the neck if clinically indicated. 4. No intracranial large vessel occlusion or significant stenosis. These results were called by telephone at the time of interpretation on 02/23/2022 at 9:24 pm to provider Dr. Marlyce Huge, who verbally acknowledged these results. Electronically Signed   By: Merilyn Baba M.D.   On: 02/23/2022 21:24   DG Chest Portable 1 View  Result Date: 02/23/2022 CLINICAL DATA:  Stroke EXAM: PORTABLE CHEST 1 VIEW COMPARISON:  Radiograph 01/29/2022 FINDINGS: Unchanged enlarged cardiomediastinal silhouette with prior median sternotomy. Right neck catheter tip overlies the right atrium. There is no focal airspace consolidation. There is no pleural effusion. No pneumothorax. No acute osseous abnormality. IMPRESSION: Unchanged cardiomegaly.  No new airspace disease. Electronically Signed   By: Maurine Simmering M.D.   On: 02/23/2022 15:06   CT Head Wo Contrast  Result Date: 02/23/2022 CLINICAL DATA:  RUE weakness, right facial droop, dysarthria EXAM: CT HEAD WITHOUT CONTRAST TECHNIQUE: Contiguous axial images were obtained from the base of the skull through the vertex without intravenous contrast. RADIATION DOSE REDUCTION: This exam was performed according to the departmental dose-optimization program which includes automated exposure control, adjustment of the mA and/or kV according to patient size and/or use of iterative reconstruction technique. COMPARISON:  CT head 12/24/2021. FINDINGS: Brain: Subtle high left frontoparietal hypoattenuation and possible loss of gray differentiation (for example see series 2, image 19 and series 5, image 33). No evidence of acute hemorrhage, mass lesion midline shift or hydrocephalus. Patchy white matter hypoattenuation, nonspecific but compatible with chronic microvascular disease. Vascular: Calcific atherosclerosis. No convincing hyperdense  vessel identified. Skull: No acute fracture. Sinuses/Orbits: Clear sinuses.  No acute orbital findings. Other: No mastoid effusions. Dr. Leonie Man paged at the time of dictation for call of report. IMPRESSION: 1. Subtle high left frontoparietal hypoattenuation and possible loss of gray differentiation, which could represent acute infarct (particularly given the clinical history) or artifact. Recommend MRI. 2. No acute hemorrhage. Electronically Signed   By: Margaretha Sheffield M.D.   On: 02/23/2022 14:32  PHYSICAL EXAM General:  Alert, well-nourished, well-developed middle-aged African-American lady in no acute distress Respiratory:  Regular, unlabored respirations on room air  NEURO:  Mental Status: AA&Ox3  Speech/Language: speech is with moderate hesitant speech and dysarthria  Cranial Nerves:  II: PERRL. Visual fields full.  III, IV, VI: EOMI. Eyelids elevate symmetrically.  V: Sensation is intact to light touch and symmetrical to face.  VII: Right facial droop  VIII: hearing intact to voice. IX, X: Voice is dysarthric XII: tongue is midline without fasciculations. Motor: 5/5 strength to LUE and LLE, 0/5 to RUE and 3/5 TO RLE  Tone: is normal and bulk is normal Sensation- Intact to light touch bilaterally.  Coordination: Drift to RLE Gait- deferred   ASSESSMENT/PLAN Monique Henderson is a 71 y.o. female with history of atrial fibrillation, aortic atherosclerosis, aortic dissection s/p repair, breast CA, HTN, ESRD on IHD, hepatitis C, noncompliance and tobacco use presenting with right upper extremity weakness, right facial droop and slurred speech that began the day before yesterday.  Patient is taking Eliquis at home.   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, left caudate head, left caudate tail, occipital lobe and frontal lobe Etiology:  likely cardioembolic in setting of atrial fibrillation on anticoagulation CT head Left frontal hypoattenuation  possibly representing acute infarct MRI  acute infarct in left posterior frontal and parietal lobe as well as smaller acute infarcts in left caudate head, left caudate tail, left occipital lobe and left frontal lobe MRA head and neck Motion limited, no LVO or significant stenosis 2D Echo pending LDL 46 HgbA1c 4.7 VTE prophylaxis - fully anticoagulated with Eliquis    Diet   DIET - DYS 1 Room service appropriate? Yes; Fluid consistency: Nectar Thick   Eliquis (apixaban) daily prior to admission, now on Eliquis (apixaban) daily.  Therapy recommendations:  CIR Disposition:  pending  Hypertension Home meds:  amlodipine 10 mg daily, clonidine patch 0.1 mg/24 hours weekly, losartan 100 mg daily, hydralazine 50 mg TID, carvedilol 6.25 mg daily Stable Permissive hypertension (OK if < 220/120) but gradually normalize in 5-7 days Long-term BP goal normotensive  Hyperlipidemia Home meds:  rosuvastatin 10 mg daily, resumed in hospital LDL 46, goal < 70 High intensity statin not indicated as LDL below goal Continue statin at discharge  Atrial fibrillation Patient has history of atrial fibrillation Continue home Eliquis  ESRD on IHD Appreciate nephrology recommendations Continue IHD T TH S Renally dose medications as appropriate Avoid contrast when possible  Other Stroke Risk Factors Advanced Age >/= 80  Cigarette smoker advised to stop smoking  Other Active Problems none  Hospital day # [place] , MSN, AGACNP-BC [facility] See Amion for schedule and pager information 02/24/2022 2:29 PM  I have personally obtained history,examined this patient, reviewed notes, independently viewed imaging studies, participated in medical decision making and plan of care.ROS completed by me personally and pertinent positives fully documented  I have made any additions or clarifications directly to the above note. Agree with note above.  Patient presented with dysarthria  and right hemiparesis due to embolic left frontal MCA branch infarct likely from atrial fibrillation despite being on anticoagulation with Eliquis.  Unfortunately due to her renal failure and dialysis very limited treatment options and recommend continue Eliquis and maintain aggressive risk factor modifications.  Check echocardiogram.  Mobilize out of bed.  Physical occupational and speech therapy consults.  Long discussion with patient about his stroke  and atrial fibrillation and discussion about evaluation and treatment and prevention and answering questions.  Greater than 50% time during this 50-minute visit was spent in counseling and coordination of care about her stroke and A-fib and answering questions.  Stroke team will sign off.  Kindly call for questions  Antony Contras, MD Medical Director [place] Pager: [phone] 02/24/2022 10:28 PM   To contact Stroke Continuity provider, please refer to [email or web address] After hours, contact General Neurology

## 2022-02-24 NOTE — Progress Notes (Signed)
Echo attempted. Patient at dialysis at time of attempt. Will attempt again as schedule permits.

## 2022-02-24 NOTE — Progress Notes (Signed)
Speech Language Pathology Treatment:    Patient Details Name: Monique Henderson MRN: [id number] DOB: 10-03-1950 Today's Date: 02/24/2022 Time: 1188-6773 SLP Time Calculation (min) (ACUTE ONLY): 9 min    Contacted by RN that pt had to have dialysis at this time and could not wait until the MBS was completed. Pt also pocketing the regular texture.  Plan SLP will downgrade diet to Dys 1 (puree), continue nectar thick liquids and will plan on MBS tomorrow.     Orbie Pyo [email or web address] Product/process development scientist [phone]

## 2022-02-24 NOTE — Progress Notes (Addendum)
PROGRESS NOTE    Monique Henderson  WEX:[id number] DOB: August 29, 1950 DOA: 02/23/2022 PCP: Merryl Hacker, No   Brief Narrative:  HPI: Monique Henderson is a 71 y.o. female with medical history significant of ESRD on HD TTS, PAF on Eliquis, aortic dissection status postrepair, HFrEF, refractory HTN, presented with strokelike symptoms.   Symptoms started yesterday evening around 9:00, patient suddenly developed right-sided facial droop and right arm weakness.  She chose to not come in the hospital and went to bed.  She also noticed that when she tried to eat or drink something , she started to cough right after.  As result, she did not eat or drink anything since yesterday evening.  This morning, patient woke up with persistent right-sided facial droop and right arm weakness.  She also developed slurred speech when talking to her sister over the phone.  She claimed that she is been taking her blood pressure medications and Eliquis every day, last time she took Eliquis was yesterday evening ED Course: Blood pressure significant elevated SBP> 200, DBP> 130, CT head negative for acute findings.  Blood work K5.9, no significant ST changes.    Assessment & Plan:   Principal Problem:   Stroke [facility]) Active Problems:   Hyperkalemia   AF (paroxysmal atrial fibrillation) (HCC)   ESRD on dialysis ([place])   DNR (do not resuscitate)   Stroke (cerebrum) ([place])  Right upper extremity paresis and right-sided facial droop and dysarthria/acute ischemic stroke: Acute to subacute cortical infarct in the left posterior frontal and anterior parietal lobe, including the central sulcus. Additional focal acute to subacute infarct involving the left caudate head. Additional smaller acute to subacute infarcts involving the left caudate tail, occipital lobe, and frontal lobe.  Allergy on board, per them, likely cardioembolic.  Echo pending.  Continue Eliquis.  PT OT recommends CIR.  HTN emergency: Blood pressure improving but  still elevated.  She is on amlodipine 10 mg p.o. daily, Coreg 6.25 mg p.o. twice daily which she did not receive this morning, clonidine patch.  I will resume Imdur.  Continue as needed labetalol.   Hyperkalemia: Resolved.  ESRD on HD -Last dialysis last Saturday, nephrology consulted.  Dialysis today.   Chronic HFrEF: Stable.   PAF: Continue Eliquis and Coreg.   History of cocaine abuse -Denied recent cocaine use.  UDS ordered but sample not collected yet.    DVT prophylaxis: apixaban (ELIQUIS) tablet 2.5 mg Start: 02/24/22 1000   Code Status: DNR  Family Communication:  None present at bedside.  Plan of care discussed with patient in length and he/she verbalized understanding and agreed with it.  Status is: Inpatient Remains inpatient appropriate because: Needs placement to CIR.   Estimated body mass index is 18.5 kg/m as calculated from the following:   Height as of 01/29/22: '5\' 4"'$  (1.626 m).   Weight as of this encounter: 48.9 kg.    Nutritional Assessment: Body mass index is 18.5 kg/m.Marland Kitchen Seen by dietician.  I agree with the assessment and plan as outlined below: Nutrition Status:        . Skin Assessment: I have examined the patient's skin and I agree with the wound assessment as performed by the wound care RN as outlined below:    Consultants:  Neurology and nephrology  Procedures:  None  Antimicrobials:  Anti-infectives (From admission, onward)    None         Subjective: Patient seen and examined.  She states that she is feeling better.  She  had no complaints.  Objective: Vitals:   02/24/22 1302 02/24/22 1330 02/24/22 1430 02/24/22 1500  BP: (!) 184/111 (!) 192/113 (!) 185/109 (!) 195/116  Pulse: 80 82 80 79  Resp: (!) 27 20 (!) 22 20  Temp:      TempSrc:      SpO2: 100% 100% 100% 100%  Weight:       No intake or output data in the 24 hours ending 02/24/22 1507 Filed Weights   02/24/22 1245  Weight: 48.9 kg    Examination:  General  exam: Appears calm and comfortable  Respiratory system: Clear to auscultation. Respiratory effort normal. Cardiovascular system: S1 & S2 heard, RRR. No JVD, murmurs, rubs, gallops or clicks. No pedal edema. Gastrointestinal system: Abdomen is nondistended, soft and nontender. No organomegaly or masses felt. Normal bowel sounds heard. Central nervous system: Alert and oriented.  Right-sided facial droop and tongue deviation, 3/5 power in right upper extremity.  Data Reviewed: I have personally reviewed following labs and imaging studies  CBC: Recent Labs  Lab 02/23/22 1846  WBC 4.4  NEUTROABS 2.8  HGB 11.8*  HCT 38.7  MCV 101.6*  PLT 188   Basic Metabolic Panel: Recent Labs  Lab 02/23/22 1444 02/23/22 1846 02/24/22 0411  NA 141  --  140  K 5.9* 4.0 4.0  CL 102  --  105  CO2 26  --  24  GLUCOSE 93  --  149*  BUN 42*  --  47*  CREATININE 8.96*  --  10.03*  CALCIUM 10.5*  --  9.6   GFR: Estimated Creatinine Clearance: 4.1 mL/min (A) (by C-G formula based on SCr of 10.03 mg/dL (H)). Liver Function Tests: Recent Labs  Lab 02/23/22 1444  AST 44*  ALT 12  ALKPHOS 78  BILITOT 0.6  PROT 7.5  ALBUMIN 4.1   No results for input(s): "LIPASE", "AMYLASE" in the last 168 hours. No results for input(s): "AMMONIA" in the last 168 hours. Coagulation Profile: Recent Labs  Lab 02/23/22 1444  INR 1.0   Cardiac Enzymes: No results for input(s): "CKTOTAL", "CKMB", "CKMBINDEX", "TROPONINI" in the last 168 hours. BNP (last 3 results) No results for input(s): "PROBNP" in the last 8760 hours. HbA1C: Recent Labs    02/23/22 1846  HGBA1C 4.7*   CBG: Recent Labs  Lab 02/23/22 1357 02/23/22 1755  GLUCAP 106* 103*   Lipid Profile: Recent Labs    02/24/22 0411  CHOL 104  HDL 49  LDLCALC 46  TRIG 43  CHOLHDL 2.1   Thyroid Function Tests: No results for input(s): "TSH", "T4TOTAL", "FREET4", "T3FREE", "THYROIDAB" in the last 72 hours. Anemia Panel: No results for  input(s): "VITAMINB12", "FOLATE", "FERRITIN", "TIBC", "IRON", "RETICCTPCT" in the last 72 hours. Sepsis Labs: No results for input(s): "PROCALCITON", "LATICACIDVEN" in the last 168 hours.  No results found for this or any previous visit (from the past 240 hour(s)).   Radiology Studies: DG CHEST PORT 1 VIEW  Result Date: 02/24/2022 CLINICAL DATA:  Congestive heart failure. EXAM: PORTABLE CHEST 1 VIEW COMPARISON:  February 23, 2022. FINDINGS: Stable cardiomegaly. Right internal jugular Port-A-Cath is unchanged in position. Lungs are clear. Bony thorax is unremarkable. IMPRESSION: No active disease. Electronically Signed   By: Marijo Conception M.D.   On: 02/24/2022 08:59   MR BRAIN WO CONTRAST  Result Date: 02/23/2022 CLINICAL DATA:  Stroke-like symptoms; right upper extremity weakness, right facial droop, dysarthria EXAM: MRI HEAD WITHOUT CONTRAST MRA HEAD WITHOUT CONTRAST MRA NECK WITHOUT CONTRAST TECHNIQUE:  Multiplanar, multi-echo pulse sequences of the brain and surrounding structures were acquired without intravenous contrast. Angiographic images of the Circle of Willis were acquired using MRA technique without intravenous contrast. Angiographic images of the neck were acquired using MRA technique withoutintravenous contrast. Carotid stenosis measurements (when applicable) are obtained utilizing NASCET criteria, using the distal internal carotid diameter as the denominator. COMPARISON:  MRI/MRA head 05/21/2019, correlation is made with CT head 02/23/2022 FINDINGS: MRI HEAD FINDINGS Brain: Cortical restricted diffusion with ADC correlate in the left posterior MCA territory, involving the left posterior frontal and anterior parietal lobes (series 5, image 87 and series 7, image 51), including the central sulcus. Focal restricted diffusion with ADC correlate in the left caudate head (series 5, image 78). Smaller foci of restricted diffusion with ADC correlates in the left occipital lobe cortex and white  matter (series 5, images 67, 68, 78), left caudate taill(series 5, image 78), and left frontal lobe (series 5, image 86). These areas are associated with increased T2 hyperintense signal. No acute hemorrhage, mass, mass effect, or midline shift. Scattered foci susceptibility in the bilateral cerebral hemispheres and deep gray structures, likely sequela of prior infarcts and microhemorrhages. Confluent T2 hyperintense signal in the periventricular white matter, likely the sequela of severe chronic small vessel ischemic disease. No hydrocephalus or extra-axial collection. Vascular: Please see MRA findings below. Skull and upper cervical spine: Normal marrow signal. Sinuses/Orbits: No acute finding. Other: The mastoids are well aerated. MRA HEAD FINDINGS Anterior circulation: Both internal carotid arteries are patent to the termini, without significant stenosis. A1 segments patent. Normal anterior communicating artery. Anterior cerebral arteries are patent to their distal aspects. No M1 stenosis or occlusion. Normal MCA bifurcations. Distal MCA branches perfused and symmetric. Posterior circulation: Evaluation of the proximal vertebral arteries is limited by motion artifact. Vertebral arteries otherwise patent to the vertebrobasilar junction without stenosis. Basilar patent to its distal aspect. Superior cerebellar arteries patent bilaterally. Patent P1 segments. PCAs perfused to their distal aspects without stenosis. The bilateral posterior communicating arteries are not visualized. Anatomic variants: None significant MRA NECK FINDINGS Evaluation is somewhat limited by motion artifact. No evidence of dissection, occlusion, or hemodynamically significant stenosis (greater than 50%). Suspect beading of the right-greater-than-left distal ICAs bilaterally (series 6, images 181-224 on the right and 216-228 on the left). IMPRESSION: 1. Acute to subacute cortical infarct in the left posterior frontal and anterior parietal  lobe, including the central sulcus. Additional focal acute to subacute infarct involving the left caudate head 2. Additional smaller acute to subacute infarcts involving the left caudate tail, occipital lobe, and frontal lobe. 3. No definite hemodynamically significant stenosis in the neck. Possible beading of the right-greater-than-left distal ICAs, concerning for fibromuscular dysplasia. This could be better evaluated with a CTA of the neck if clinically indicated. 4. No intracranial large vessel occlusion or significant stenosis. These results were called by telephone at the time of interpretation on 02/23/2022 at 9:24 pm to provider Dr. Marlyce Huge, who verbally acknowledged these results. Electronically Signed   By: Merilyn Baba M.D.   On: 02/23/2022 21:24   MR ANGIO HEAD WO CONTRAST  Result Date: 02/23/2022 CLINICAL DATA:  Stroke-like symptoms; right upper extremity weakness, right facial droop, dysarthria EXAM: MRI HEAD WITHOUT CONTRAST MRA HEAD WITHOUT CONTRAST MRA NECK WITHOUT CONTRAST TECHNIQUE: Multiplanar, multi-echo pulse sequences of the brain and surrounding structures were acquired without intravenous contrast. Angiographic images of the Circle of Willis were acquired using MRA technique without intravenous contrast. Angiographic images of the neck  were acquired using MRA technique withoutintravenous contrast. Carotid stenosis measurements (when applicable) are obtained utilizing NASCET criteria, using the distal internal carotid diameter as the denominator. COMPARISON:  MRI/MRA head 05/21/2019, correlation is made with CT head 02/23/2022 FINDINGS: MRI HEAD FINDINGS Brain: Cortical restricted diffusion with ADC correlate in the left posterior MCA territory, involving the left posterior frontal and anterior parietal lobes (series 5, image 87 and series 7, image 51), including the central sulcus. Focal restricted diffusion with ADC correlate in the left caudate head (series 5, image 78). Smaller foci  of restricted diffusion with ADC correlates in the left occipital lobe cortex and white matter (series 5, images 67, 68, 78), left caudate taill(series 5, image 78), and left frontal lobe (series 5, image 86). These areas are associated with increased T2 hyperintense signal. No acute hemorrhage, mass, mass effect, or midline shift. Scattered foci susceptibility in the bilateral cerebral hemispheres and deep gray structures, likely sequela of prior infarcts and microhemorrhages. Confluent T2 hyperintense signal in the periventricular white matter, likely the sequela of severe chronic small vessel ischemic disease. No hydrocephalus or extra-axial collection. Vascular: Please see MRA findings below. Skull and upper cervical spine: Normal marrow signal. Sinuses/Orbits: No acute finding. Other: The mastoids are well aerated. MRA HEAD FINDINGS Anterior circulation: Both internal carotid arteries are patent to the termini, without significant stenosis. A1 segments patent. Normal anterior communicating artery. Anterior cerebral arteries are patent to their distal aspects. No M1 stenosis or occlusion. Normal MCA bifurcations. Distal MCA branches perfused and symmetric. Posterior circulation: Evaluation of the proximal vertebral arteries is limited by motion artifact. Vertebral arteries otherwise patent to the vertebrobasilar junction without stenosis. Basilar patent to its distal aspect. Superior cerebellar arteries patent bilaterally. Patent P1 segments. PCAs perfused to their distal aspects without stenosis. The bilateral posterior communicating arteries are not visualized. Anatomic variants: None significant MRA NECK FINDINGS Evaluation is somewhat limited by motion artifact. No evidence of dissection, occlusion, or hemodynamically significant stenosis (greater than 50%). Suspect beading of the right-greater-than-left distal ICAs bilaterally (series 6, images 181-224 on the right and 216-228 on the left). IMPRESSION: 1.  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 Additional focal acute to subacute infarct involving the left caudate head 2. Additional smaller acute to subacute infarcts involving the left caudate tail, occipital lobe, and frontal lobe. 3. No definite hemodynamically significant stenosis in the neck. Possible beading of the right-greater-than-left distal ICAs, concerning for fibromuscular dysplasia. This could be better evaluated with a CTA of the neck if clinically indicated. 4. No intracranial large vessel occlusion or significant stenosis. These results were called by telephone at the time of interpretation on 02/23/2022 at 9:24 pm to provider Dr. Marlyce Huge, who verbally acknowledged these results. Electronically Signed   By: Merilyn Baba M.D.   On: 02/23/2022 21:24   MR ANGIO NECK WO CONTRAST  Result Date: 02/23/2022 CLINICAL DATA:  Stroke-like symptoms; right upper extremity weakness, right facial droop, dysarthria EXAM: MRI HEAD WITHOUT CONTRAST MRA HEAD WITHOUT CONTRAST MRA NECK WITHOUT CONTRAST TECHNIQUE: Multiplanar, multi-echo pulse sequences of the brain and surrounding structures were acquired without intravenous contrast. Angiographic images of the Circle of Willis were acquired using MRA technique without intravenous contrast. Angiographic images of the neck were acquired using MRA technique withoutintravenous contrast. Carotid stenosis measurements (when applicable) are obtained utilizing NASCET criteria, using the distal internal carotid diameter as the denominator. COMPARISON:  MRI/MRA head 05/21/2019, correlation is made with  CT head 02/23/2022 FINDINGS: MRI HEAD FINDINGS Brain: Cortical restricted diffusion with ADC correlate in the left posterior MCA territory, involving the left posterior frontal and anterior parietal lobes (series 5, image 87 and series 7, image 51), including the central sulcus. Focal restricted  diffusion with ADC correlate in the left caudate head (series 5, image 78). Smaller foci of restricted diffusion with ADC correlates in the left occipital lobe cortex and white matter (series 5, images 67, 68, 78), left caudate taill(series 5, image 78), and left frontal lobe (series 5, image 86). These areas are associated with increased T2 hyperintense signal. No acute hemorrhage, mass, mass effect, or midline shift. Scattered foci susceptibility in the bilateral cerebral hemispheres and deep gray structures, likely sequela of prior infarcts and microhemorrhages. Confluent T2 hyperintense signal in the periventricular white matter, likely the sequela of severe chronic small vessel ischemic disease. No hydrocephalus or extra-axial collection. Vascular: Please see MRA findings below. Skull and upper cervical spine: Normal marrow signal. Sinuses/Orbits: No acute finding. Other: The mastoids are well aerated. MRA HEAD FINDINGS Anterior circulation: Both internal carotid arteries are patent to the termini, without significant stenosis. A1 segments patent. Normal anterior communicating artery. Anterior cerebral arteries are patent to their distal aspects. No M1 stenosis or occlusion. Normal MCA bifurcations. Distal MCA branches perfused and symmetric. Posterior circulation: Evaluation of the proximal vertebral arteries is limited by motion artifact. Vertebral arteries otherwise patent to the vertebrobasilar junction without stenosis. Basilar patent to its distal aspect. Superior cerebellar arteries patent bilaterally. Patent P1 segments. PCAs perfused to their distal aspects without stenosis. The bilateral posterior communicating arteries are not visualized. Anatomic variants: None significant MRA NECK FINDINGS Evaluation is somewhat limited by motion artifact. No evidence of dissection, occlusion, or hemodynamically significant stenosis (greater than 50%). Suspect beading of the right-greater-than-left distal ICAs  bilaterally (series 6, images 181-224 on the right and 216-228 on the left). IMPRESSION: 1. 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 Additional focal acute to subacute infarct involving the left caudate head 2. Additional smaller acute to subacute infarcts involving the left caudate tail, occipital lobe, and frontal lobe. 3. No definite hemodynamically significant stenosis in the neck. Possible beading of the right-greater-than-left distal ICAs, concerning for fibromuscular dysplasia. This could be better evaluated with a CTA of the neck if clinically indicated. 4. No intracranial large vessel occlusion or significant stenosis. These results were called by telephone at the time of interpretation on 02/23/2022 at 9:24 pm to provider Dr. Marlyce Huge, who verbally acknowledged these results. Electronically Signed   By: Merilyn Baba M.D.   On: 02/23/2022 21:24   DG Chest Portable 1 View  Result Date: 02/23/2022 CLINICAL DATA:  Stroke EXAM: PORTABLE CHEST 1 VIEW COMPARISON:  Radiograph 01/29/2022 FINDINGS: Unchanged enlarged cardiomediastinal silhouette with prior median sternotomy. Right neck catheter tip overlies the right atrium. There is no focal airspace consolidation. There is no pleural effusion. No pneumothorax. No acute osseous abnormality. IMPRESSION: Unchanged cardiomegaly.  No new airspace disease. Electronically Signed   By: Maurine Simmering M.D.   On: 02/23/2022 15:06   CT Head Wo Contrast  Result Date: 02/23/2022 CLINICAL DATA:  RUE weakness, right facial droop, dysarthria EXAM: CT HEAD WITHOUT CONTRAST TECHNIQUE: Contiguous axial images were obtained from the base of the skull through the vertex without intravenous contrast. RADIATION DOSE REDUCTION: This exam was performed according to the departmental dose-optimization program which includes automated exposure control, adjustment of the  mA and/or kV according to patient  size and/or use of iterative reconstruction technique. COMPARISON:  CT head 12/24/2021. FINDINGS: Brain: Subtle high left frontoparietal hypoattenuation and possible loss of gray differentiation (for example see series 2, image 19 and series 5, image 33). No evidence of acute hemorrhage, mass lesion midline shift or hydrocephalus. Patchy white matter hypoattenuation, nonspecific but compatible with chronic microvascular disease. Vascular: Calcific atherosclerosis. No convincing hyperdense vessel identified. Skull: No acute fracture. Sinuses/Orbits: Clear sinuses.  No acute orbital findings. Other: No mastoid effusions. Dr. Leonie Man paged at the time of dictation for call of report. IMPRESSION: 1. Subtle high left frontoparietal hypoattenuation and possible loss of gray differentiation, which could represent acute infarct (particularly given the clinical history) or artifact. Recommend MRI. 2. No acute hemorrhage. Electronically Signed   By: Margaretha Sheffield M.D.   On: 02/23/2022 14:32    Scheduled Meds:  amLODipine  10 mg Oral Daily   apixaban  2.5 mg Oral BID   carvedilol  6.25 mg Oral BID WC   Chlorhexidine Gluconate Cloth  6 each Topical Daily   [START ON 02/28/2022] cloNIDine  0.1 mg Transdermal Q Sat   doxercalciferol  4 mcg Intravenous Q T,Th,Sa-HD   isosorbide mononitrate  30 mg Oral Daily   rosuvastatin  10 mg Oral Daily   Continuous Infusions:   LOS: 1 day   Darliss Cheney, MD [facility]  02/24/2022, 3:07 PM   *Please note that this is a verbal dictation therefore any spelling or grammatical errors are due to the "[employer]" system interpretation.  Please page via [place] and do not message via secure chat for urgent patient care matters. Secure chat can be used for non urgent patient care matters.  How to contact the [facility] Attending or Consulting provider [place] or covering provider during after hours [place], for this patient?  Check the care team in [facility] and look for a)  attending/consulting [facility] provider listed and b) the [facility] team listed. Page or secure chat [phone]. Log into [email or web address] and use [place]'s universal password to access. If you do not have the password, please contact the hospital operator. Locate the [facility] provider you are looking for under [facility] and page to a number that you can be directly reached. If you still have difficulty reaching the provider, please page the [facility] (Director on Call) for the Hospitalists listed on amion for assistance.

## 2022-02-24 NOTE — Progress Notes (Signed)
Inpatient Rehab Admissions Coordinator:   Per threrapy recommendations,  patient was screened for CIR candidacy by Clemens Catholic, MS, CCC-SLP. At this time, Pt. Appears to be a a potential candidate for CIR. I will place   order for rehab consult per protocol for full assessment. Please contact me any with questions.  Clemens Catholic, [place], [place] Admissions Coordinator  [phone] ([place]) [phone] (office)

## 2022-02-24 NOTE — Evaluation (Signed)
Speech Language Pathology Evaluation Patient Details Name: Monique Henderson MRN: [id number] DOB: 1950/06/08 Today's Date: 02/24/2022 Time: 9381-8299 SLP Time Calculation (min) (ACUTE ONLY): 9 min  Problem List:  Patient Active Problem List   Diagnosis Date Noted   Stroke (cerebrum) ([place]) 02/23/2022   Stroke ([place]) 02/23/2022   Hypervolemia associated with renal insufficiency 01/29/2022   Dyslipidemia 01/29/2022   DNR (do not resuscitate) 01/29/2022   Polysubstance abuse ([place]) 01/06/2022   Acute on chronic combined systolic and diastolic CHF (congestive heart failure) ([place]) 01/06/2022   Abdominal pain 37/16/9678   Acute metabolic encephalopathy 93/81/0175   Malignant HTN with heart disease, w/o CHF, 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 12/24/2021   History of hepatitis C 12/08/2021   Hypertensive urgency 10/29/2021   Fluid overload 10/29/2021   Acute on chronic HFrEF (heart failure with reduced ejection fraction) ([place]) 10/29/2021   Chronic anticoagulation 10/29/2021   Prolonged QT interval 10/29/2021   Leukopenia 10/29/2021   SOB (shortness of breath) 09/15/2021   Hypertensive emergency 08/23/2021   Altered mental status    ESRD (end stage renal disease) on dialysis with hyperkalemia and metabolic acidosis with elevated AG  08/12/2021   Malignant hypertension 06/19/2021   Anemia 06/05/2021   Pure hypercholesterolemia 02/14/2021   Acute pulmonary edema ([place]) 12/23/2020   Acute respiratory failure with hypoxia ([place]) 09/21/2020   Acute renal failure superimposed on chronic kidney disease ([place]) 09/21/2020   Community acquired pneumonia 09/21/2020   Uremia 03/25/8526   Metabolic acidosis with increased anion gap and accumulation of organic acids 09/21/2020   Hypertensive crisis 09/21/2020   Troponin level elevated 08/28/2019   Positive D dimer 08/28/2019   Hyperkalemia 08/28/2019   SVT (supraventricular tachycardia) ([place]) 08/28/2019   SIRS (systemic inflammatory response syndrome) ([place])  08/27/2019   Paroxysmal SVT (supraventricular tachycardia) ([place]) 08/27/2019   Aortic atherosclerosis ([place]) 07/05/2019   Chest pain 07/05/2019   History of CVA (cerebrovascular accident) 05/29/2019   AF (paroxysmal atrial fibrillation) ([place]) 05/29/2019   ESRD on dialysis ([place]) 05/29/2019   Cerebral thrombosis with cerebral infarction 05/22/2019   Malnutrition of moderate degree 05/19/2019   Pleural effusion 05/18/2019   Atrial fibrillation with RVR ([place]) 05/18/2019   S/P aortic aneurysm repair 04/07/2019   Aortic dissection ([place]) 04/04/2019   Dissection of aorta ([place]) 04/03/2019   Non compliance with medical treatment 12/04/2017   Chronic obstructive lung disease ([place]) 01/16/2017   Chronic low back pain 06/22/2016   Insomnia 03/14/2015   S/P lumbar spinal fusion 01/18/2014   Tobacco use disorder 04/19/2009   GERD 09/16/2006   Past Medical History:  Past Medical History:  Diagnosis Date   AF (paroxysmal atrial fibrillation) ([place]) 05/29/2019   on Coumadin   Aortic atherosclerosis ([place]) 07/05/2019   Aortic dissection ([place]) 04/04/2019   s/p repair   Bone spur 2008   Right calcaneal foot spur   Breast cancer ([place]) 2004   Ductal carcinoma in situ of the left breast; S/P left partial mastectomy 02/26/2003; S/P re-excision of cranial and lateral margins11/18/2004.radiation   Cerebral thrombosis with cerebral infarction 05/22/2019   Chronic low back pain 06/22/2016   Chronic obstructive lung disease ([place]) 01/16/2017   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 Autumn Messing; S/P re-excision of superior and inferior margins 10/27/2012.    ESRD on hemodialysis ([place]) 05/29/2019   Essential hypertension 09/16/2006   GERD 09/16/2006   Hepatitis C    treated and RNA confirmed not detectable 01/2017  Insomnia 03/14/2015   Malnutrition of moderate degree 05/19/2019   Non compliance with medical treatment 12/04/2017   Normocytic anemia     With thrombocytosis   Osteoarthritis    Right ureteral stone 2002   S/P lumbar spinal fusion 01/18/2014   S/P lumbar decompressive laminectomy, fusion, and plating for lumbar spinal stenosis on 05/27/2009 by Dr. Eustace Moore.  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 Eustace Moore.     Tobacco use disorder 04/19/2009   Uterine fibroid    Wears dentures    top   Past Surgical History:  Past Surgical History:  Procedure Laterality Date   ANTERIOR LAT LUMBAR FUSION N/A 01/18/2014   Procedure: ANTERIOR LATERAL LUMBAR FUSION LUMBAR TWO-THREE;  Surgeon: Eustace Moore, MD;  Location: [facility];  Service: Neurosurgery;  Laterality: N/A;   ANTERIOR LUMBAR FUSION  01/18/2014   AV FISTULA PLACEMENT Left 04/20/2019   Procedure: ARTERIOVENOUS (AV) FISTULA CREATION;  Surgeon: [name], MD;  Location: [facility];  Service: Vascular;  Laterality: Left;   BACK SURGERY     BREAST LUMPECTOMY Left 01/2003   BREAST LUMPECTOMY Right 2014   BREAST LUMPECTOMY WITH NEEDLE LOCALIZATION AND AXILLARY SENTINEL LYMPH NODE BX Right 10/13/2012   Procedure: BREAST LUMPECTOMY WITH NEEDLE LOCALIZATION;  Surgeon: [name], MD;  Location: [facility];  Service: General;  Laterality: Right;  Right breast wire localized lumpectomy   INSERTION OF DIALYSIS CATHETER Right 04/20/2019   Procedure: INSERTION OF DIALYSIS CATHETER, right internal jugular;  Surgeon: [name], MD;  Location: [facility];  Service: Vascular;  Laterality: Right;   INSERTION OF DIALYSIS CATHETER Right 09/24/2020   Procedure: INSERTION OF TUNNELED DIALYSIS CATHETER;  Surgeon: Rosetta Posner, MD;  Location: [facility];  Service: Vascular;  Laterality: Right;   IR FLUORO GUIDE CV LINE RIGHT  09/22/2020   IR THORACENTESIS ASP  PLEURAL SPACE W/IMG GUIDE  05/19/2019   IR US GUIDE VASC ACCESS LEFT  09/22/2020   IR US GUIDE VASC ACCESS RIGHT  09/22/2020   IR VENOCAVAGRAM SVC  09/22/2020   LAMINECTOMY  05/27/2009   Lumbar decompressive laminectomy, fusion and plating for lumbar spinal stensosis   LIGATION OF ARTERIOVENOUS  FISTULA Left 09/24/2020   Procedure: LIGATION OF LEFT ARM ARTERIOVENOUS  FISTULA;  Surgeon: Rosetta Posner, MD;  Location: [facility];  Service: Vascular;  Laterality: Left;   LUMBAR LAMINECTOMY/DECOMPRESSION MICRODISCECTOMY Left 03/23/2013   Procedure: LUMBAR LAMINECTOMY/DECOMPRESSION MICRODISCECTOMY 1 LEVEL;  Surgeon: Eustace Moore, MD;  Location: [facility];  Service: Neurosurgery;  Laterality: Left;  LUMBAR LAMINECTOMY/DECOMPRESSION MICRODISCECTOMY 1 LEVEL   MASTECTOMY, PARTIAL Left 02/26/2003   ; S/P re-excision of cranial and lateral margins 04/19/2003.    RE-EXCISION OF BREAST CANCER,SUPERIOR MARGINS Right 10/27/2012   Procedure: RE-EXCISION OF BREAST CANCER,SUPERIOR and inferior MARGINS;  Surgeon: [name], MD;  Location: [facility];  Service: General;  Laterality: Right;   RE-EXCISION OF BREAST LUMPECTOMY Left 04/2003   TEE WITHOUT CARDIOVERSION N/A 04/04/2019   Procedure: Transesophageal Echocardiogram ([place]);  Surgeon: Wonda Olds, MD;  Location: [facility];  Service: Open Heart Surgery;  Laterality: N/A;   THORACIC AORTIC ANEURYSM REPAIR N/A 04/04/2019   Procedure: THORACIC ASCENDING ANEURYSM REPAIR (AAA)  USING 28 MM X 30 CM HEMASHIELD PLATINUM VASCULAR GRAFT;  Surgeon:  Wonda Olds, MD;  Location: [facility];  Service: Open Heart Surgery;  Laterality: N/A;   HPI:  Pt is a 71 y.o. female with medical history significant of ESRD on HD, PAF on Eliquis, aortic dissection status postrepair, HFrEF, refractory HTN, presented with strokelike right sided deficits (facial droop, slurred speech, right sided weakness). MRI brain showed acute changes to left posterior frontal, anterior parietal lobe, including  the central sulcus, the left caudate head, and occipital lobe.   Assessment / Plan / Recommendation Clinical Impression   Pt was alert throughout encounter and actively attempting to engage in conversation. Pt presents with dysarthria and is considerably unintelligible. A cognitive linguistic evaluation was administered to further assess speech and language capabilities. Pt's receptive language capabilities appear intact as she answered auditory comprehension questions with 100% accuracy. Expressive language measures were severely limited due to noted dysarthria as pt is  moderately intelligible at only the word level. Confrontational and divergent naming appear intact as she named 5/5 environmental objects, and 3/3 items provided category. Pt able to repeat 3/4 objects, however, pt attempted final object and became frustrated when could not articulate word correctly. ST will continue to assess expressive language abilities and cognition in further sessions. ST will follow for speech/language goals.    SLP Assessment  SLP Recommendation/Assessment: Patient needs continued Speech Lanaguage Pathology Services SLP Visit Diagnosis: Dysarthria and anarthria (R47.1);Cognitive communication deficit (R41.841)    Recommendations for follow up therapy are one component of a multi-disciplinary discharge planning process, led by the attending physician.  Recommendations may be updated based on patient status, additional functional criteria and insurance authorization.    Follow Up Recommendations  Acute inpatient rehab (3hours/day)    Assistance Recommended at Discharge  Intermittent Supervision/Assistance  Functional Status Assessment Patient has had a recent decline in their functional status and demonstrates the ability to make significant improvements in function in a reasonable and predictable amount of time.  Frequency and Duration min 2x/week  2 weeks      SLP Evaluation Cognition  Overall  Cognitive Status: No family/caregiver present to determine baseline cognitive functioning Arousal/Alertness: Awake/alert Attention: Focused Focused Attention: Appears intact Memory:  (TBA) Awareness: Appears intact Problem Solving:  (TBA) Safety/Judgment: Impaired       Comprehension  Auditory Comprehension Overall Auditory Comprehension: Appears within functional limits for tasks assessed Yes/No Questions: Within Functional Limits Commands: Within Functional Limits Conversation: Simple Visual Recognition/Discrimination Discrimination: Not tested Reading Comprehension Reading Status: Not tested    Expression Expression Primary Mode of Expression: Verbal Verbal Expression Overall Verbal Expression: Appears within functional limits for tasks assessed Initiation: No impairment Level of Generative/Spontaneous Verbalization: Word Repetition: No impairment Naming: No impairment Pragmatics: No impairment Interfering Components: Speech intelligibility Written Expression Dominant Hand: Right   Oral / Motor  Oral Motor/Sensory Function Overall Oral Motor/Sensory Function: Severe impairment Facial ROM: Reduced right;Suspected CN VII (facial) dysfunction Facial Symmetry: Abnormal symmetry right;Suspected CN VII (facial) dysfunction Facial Strength: Reduced right;Suspected CN VII (facial) dysfunction Lingual ROM: Reduced right;Reduced left;Suspected CN XII (hypoglossal) dysfunction Lingual Symmetry: Within Functional Limits Lingual Strength: Reduced;Suspected CN XII (hypoglossal) dysfunction Velum: Within Functional Limits Motor Speech Overall Motor Speech: Impaired Respiration: Within functional limits Phonation: Hoarse Resonance: Within functional limits Articulation: Impaired Level of Impairment: Word Intelligibility: Intelligibility reduced Word: 25-49% accurate Phrase: 25-49% accurate Sentence: 0-24% accurate Conversation: 0-24% accurate Motor Planning: Not  tested Motor Speech Errors: Aware Effective Techniques: Increased vocal intensity;Over-articulate;Slow rate           Moshe Salisbury  Diannia Ruder, Student SLP  02/24/2022, 11:17 AM

## 2022-02-24 NOTE — Evaluation (Signed)
Physical Therapy Evaluation  Patient Details Name: Monique Henderson MRN: [id number] DOB: 1950/09/06 Today's Date: 02/24/2022  History of Present Illness  Pt is a 71 y.o. female who presents to [facility] on [facility]/25 with R sided facial droop, R UE weakness, dysarthria. MRI revealed acute to subacute cortical infarct in the L posterior frontal and anterior parietal lobe, smaller acute to subacute infarct in L caudate tail, occipital lobe and frontal lobe. Noted 8/31 admission due to volume overload.  PMH includes ESRD on HD TTS, HTN, PAF, polysubstance abuse, aortic aneurysm dissection s/p repair.   Clinical Impression  Pt admitted with above diagnosis. Pt currently with functional limitations due to the deficits listed below (see PT Problem List). At the time of PT eval pt was able to perform transfers and ambulation with gross min assist for balance support and safety. Pt initially reporting she felt back to normal, however with notable R side hemiplegia, aphasia and dysarthria. Pt reports she will have good family support upon d/c. Recommend AIR level rehab at d/c to maximize functional independence and safety. Acutely, pt will benefit from skilled PT to increase their independence and safety with mobility to allow discharge to the venue listed below.          Recommendations for follow up therapy are one component of a multi-disciplinary discharge planning process, led by the attending physician.  Recommendations may be updated based on patient status, additional functional criteria and insurance authorization.  Follow Up Recommendations Acute inpatient rehab (3hours/day)      Assistance Recommended at Discharge Frequent or constant Supervision/Assistance  Patient can return home with the following  A little help with walking and/or transfers;A little help with bathing/dressing/bathroom;Assistance with cooking/housework;Assist for transportation;Help with stairs or ramp for entrance    Equipment  Recommendations Other (comment) (TBD by next venue of care)  Recommendations for Other Services  Rehab consult    Functional Status Assessment Patient has had a recent decline in their functional status and demonstrates the ability to make significant improvements in function in a reasonable and predictable amount of time.     Precautions / Restrictions Precautions Precautions: Fall Precaution Comments: R UE hemiparesis Restrictions Weight Bearing Restrictions: No      Mobility  Bed Mobility Overal bed mobility: Needs Assistance Bed Mobility: Supine to Sit, Sit to Supine     Supine to sit: Min assist Sit to supine: Min assist   General bed mobility comments: Pt attempting to manage her RUE, however unable to completely keep in appropriate position. Assist provided for full RUE management, as well as for trunk support as pt scooted out to EOB.    Transfers Overall transfer level: Needs assistance Equipment used: None Transfers: Sit to/from Stand Sit to Stand: Min assist           General transfer comment: to power up and steady    Ambulation/Gait Ambulation/Gait assistance: Min assist Gait Distance (Feet): 200 Feet Assistive device: 1 person hand held assist Gait Pattern/deviations: Step-through pattern, Decreased stride length, Antalgic, Drifts right/left, Narrow base of support Gait velocity: Decreased Gait velocity interpretation: <1.8 ft/sec, indicate of risk for recurrent falls   General Gait Details: HHA for balance support and safety. Therapist guiding pt in hall by [facility] and gait belt due to drifting. Grossly unsteady, requiring min assist throughout.  Stairs            Wheelchair Mobility    Modified Rankin (Stroke Patients Only)       Balance Overall balance  assessment: Needs assistance Sitting-balance support: No upper extremity supported, Feet supported Sitting balance-Leahy Scale: Fair     Standing balance support: During functional  activity, No upper extremity supported Standing balance-Leahy Scale: Poor Standing balance comment: relies on external support               High Level Balance Comments: Pt tolerated head turns (horizontal and vertical) while ambulating fairly well with only minimal LOB             Pertinent Vitals/Pain Pain Assessment Pain Assessment: Faces Faces Pain Scale: No hurt    Home Living Family/patient expects to be discharged to:: Private residence Living Arrangements: Non-relatives/Friends Available Help at Discharge: Friend(s) Type of Home: Apartment Home Access: Other (comment) (chair lift to her apartment?)       Home Layout: One level Home Equipment: Conservation officer, nature (2 wheels);Cane - single point;BSC/3in1 Additional Comments: difficult to understand at times- I think this is correct    Prior Function Prior Level of Function : Independent/Modified Independent;Driving;History of Falls (last six months)             Mobility Comments: no AD, reports 1 fall in the last 6 months ADLs Comments: independent ADLs, IADLs, driving (NP from nephrology reports 11th admission this year, medical non compliance)     Hand Dominance   Dominant Hand: Right    Extremity/Trunk Assessment   Upper Extremity Assessment Upper Extremity Assessment: RUE deficits/detail RUE Deficits / Details: grossly 3-/5 shoulder elevation and retraction, 2/5 shoulder flexion and elbow flexion, 0/5 forearm/hand RUE Sensation: decreased proprioception;decreased light touch RUE Coordination: decreased gross motor;decreased fine motor    Lower Extremity Assessment Lower Extremity Assessment: RLE deficits/detail RLE Deficits / Details: Mildly decreased strength compared to L. MMT difficult due to pt slow to follow commands.    Cervical / Trunk Assessment Cervical / Trunk Assessment: Other exceptions Cervical / Trunk Exceptions: Forward head posture with rounded shoulders  Communication    Communication: Expressive difficulties (dysarthric)  Cognition Arousal/Alertness: Awake/alert Behavior During Therapy: Flat affect Overall Cognitive Status: Impaired/Different from baseline Area of Impairment: Following commands, Awareness, Problem solving, Safety/judgement                       Following Commands: Follows one step commands consistently, Follows one step commands with increased time, Follows multi-step commands inconsistently Safety/Judgement: Decreased awareness of deficits, Decreased awareness of safety Awareness: Intellectual Problem Solving: Slow processing, Difficulty sequencing, Requires verbal cues          General Comments      Exercises     Assessment/Plan    PT Assessment Patient needs continued PT services  PT Problem List Decreased strength;Decreased activity tolerance;Decreased range of motion;Decreased balance;Decreased mobility;Decreased cognition;Decreased coordination;Decreased knowledge of use of DME;Decreased safety awareness;Decreased knowledge of precautions       PT Treatment Interventions DME instruction;Gait training;Functional mobility training;Therapeutic activities;Therapeutic exercise;Neuromuscular re-education;Balance training;Cognitive remediation;Patient/family education;Stair training    PT Goals (Current goals can be found in the Care Plan section)  Acute Rehab PT Goals Patient Stated Goal: None stated but agreeable to rehab PT Goal Formulation: With patient Time For Goal Achievement: 03/10/22 Potential to Achieve Goals: Good    Frequency Min 2X/week     Co-evaluation               AM-PAC PT "6 Clicks" Mobility  Outcome Measure Help needed turning from your back to your side while in a flat bed without using bedrails?: A Little Help needed  moving from lying on your back to sitting on the side of a flat bed without using bedrails?: A Little Help needed moving to and from a bed to a chair (including a  wheelchair)?: A Little Help needed standing up from a chair using your arms (e.g., wheelchair or bedside chair)?: A Little Help needed to walk in hospital room?: A Little Help needed climbing 3-5 steps with a railing? : A Lot 6 Click Score: 17    End of Session Equipment Utilized During Treatment: Gait belt Activity Tolerance: Patient tolerated treatment well Patient left: in bed;with call bell/phone within reach Nurse Communication: Mobility status PT Visit Diagnosis: Unsteadiness on feet (R26.81);Hemiplegia and hemiparesis Hemiplegia - Right/Left: Right Hemiplegia - dominant/non-dominant: Dominant Hemiplegia - caused by: Cerebral infarction    Time: 1101-1117 PT Time Calculation (min) (ACUTE ONLY): 16 min   Charges:   PT Evaluation $PT Eval Moderate Complexity: 1 Mod          Rolinda Roan, PT, DPT Acute Rehabilitation Services Secure Chat Preferred Office: [phone]   Thelma Comp 02/24/2022, 1:59 PM

## 2022-02-24 NOTE — Plan of Care (Signed)

## 2022-02-24 NOTE — Evaluation (Signed)
Clinical/Bedside Swallow Evaluation Patient Details  Name: Monique Henderson MRN: [id number] Date of Birth: 1951-03-03  Today's Date: 02/24/2022 Time: SLP Start Time (ACUTE ONLY): 0845 SLP Stop Time (ACUTE ONLY): 5573 SLP Time Calculation (min) (ACUTE ONLY): 8 min  Past Medical History:  Past Medical History:  Diagnosis Date   AF (paroxysmal atrial fibrillation) ([place]) 05/29/2019   on Coumadin   Aortic atherosclerosis ([place]) 07/05/2019   Aortic dissection ([place]) 04/04/2019   s/p repair   Bone spur 2008   Right calcaneal foot spur   Breast cancer ([place]) 2004   Ductal carcinoma in situ of the left breast; S/P left partial mastectomy 02/26/2003; S/P re-excision of cranial and lateral margins11/18/2004.radiation   Cerebral thrombosis with cerebral infarction 05/22/2019   Chronic low back pain 06/22/2016   Chronic obstructive lung disease ([place]) 01/16/2017   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 Autumn Messing; S/P re-excision of superior and inferior margins 10/27/2012.    ESRD on hemodialysis ([place]) 05/29/2019   Essential hypertension 09/16/2006   GERD 09/16/2006   Hepatitis C    treated and RNA confirmed not detectable 01/2017   Insomnia 03/14/2015   Malnutrition of moderate degree 05/19/2019   Non compliance with medical treatment 12/04/2017   Normocytic anemia    With thrombocytosis   Osteoarthritis    Right ureteral stone 2002   S/P lumbar spinal fusion 01/18/2014   S/P lumbar decompressive laminectomy, fusion, and plating for lumbar spinal stenosis on 05/27/2009 by Dr. Eustace Moore.  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 Eustace Moore.     Tobacco use disorder 04/19/2009   Uterine fibroid    Wears dentures    top   Past  Surgical History:  Past Surgical History:  Procedure Laterality Date   ANTERIOR LAT LUMBAR FUSION N/A 01/18/2014   Procedure: ANTERIOR LATERAL LUMBAR FUSION LUMBAR TWO-THREE;  Surgeon: Eustace Moore, MD;  Location: [facility];  Service: Neurosurgery;  Laterality: N/A;   ANTERIOR LUMBAR FUSION  01/18/2014   AV FISTULA PLACEMENT Left 04/20/2019   Procedure: ARTERIOVENOUS (AV) FISTULA CREATION;  Surgeon: [name], MD;  Location: [facility];  Service: Vascular;  Laterality: Left;   BACK SURGERY     BREAST LUMPECTOMY Left 01/2003   BREAST LUMPECTOMY Right 2014   BREAST LUMPECTOMY WITH NEEDLE LOCALIZATION AND AXILLARY SENTINEL LYMPH NODE BX Right 10/13/2012   Procedure: BREAST LUMPECTOMY WITH NEEDLE LOCALIZATION;  Surgeon: [name], MD;  Location: [facility];  Service: General;  Laterality: Right;  Right breast wire localized lumpectomy   INSERTION OF DIALYSIS CATHETER Right 04/20/2019   Procedure: INSERTION OF DIALYSIS CATHETER, right internal jugular;  Surgeon: [name], MD;  Location: [facility];  Service: Vascular;  Laterality: Right;   INSERTION OF DIALYSIS CATHETER Right 09/24/2020   Procedure: INSERTION OF TUNNELED DIALYSIS CATHETER;  Surgeon: Rosetta Posner, MD;  Location: [facility];  Service: Vascular;  Laterality: Right;   IR FLUORO GUIDE CV LINE RIGHT  09/22/2020   IR THORACENTESIS ASP PLEURAL SPACE W/IMG GUIDE  05/19/2019   IR US GUIDE VASC ACCESS LEFT  09/22/2020   IR US GUIDE VASC ACCESS RIGHT  09/22/2020   IR VENOCAVAGRAM SVC  09/22/2020   LAMINECTOMY  05/27/2009   Lumbar decompressive  laminectomy, fusion and plating for lumbar spinal stensosis   LIGATION OF ARTERIOVENOUS  FISTULA Left 09/24/2020   Procedure: LIGATION OF LEFT ARM ARTERIOVENOUS  FISTULA;  Surgeon: Rosetta Posner, MD;  Location: [facility];  Service: Vascular;  Laterality: Left;   LUMBAR LAMINECTOMY/DECOMPRESSION MICRODISCECTOMY Left 03/23/2013   Procedure: LUMBAR LAMINECTOMY/DECOMPRESSION  MICRODISCECTOMY 1 LEVEL;  Surgeon: Eustace Moore, MD;  Location: [facility];  Service: Neurosurgery;  Laterality: Left;  LUMBAR LAMINECTOMY/DECOMPRESSION MICRODISCECTOMY 1 LEVEL   MASTECTOMY, PARTIAL Left 02/26/2003   ; S/P re-excision of cranial and lateral margins 04/19/2003.    RE-EXCISION OF BREAST CANCER,SUPERIOR MARGINS Right 10/27/2012   Procedure: RE-EXCISION OF BREAST CANCER,SUPERIOR and inferior MARGINS;  Surgeon: [name], MD;  Location: [place];  Service: General;  Laterality: Right;   RE-EXCISION OF BREAST LUMPECTOMY Left 04/2003   TEE WITHOUT CARDIOVERSION N/A 04/04/2019   Procedure: Transesophageal Echocardiogram ([place]);  Surgeon: Wonda Olds, MD;  Location: [facility];  Service: Open Heart Surgery;  Laterality: N/A;   THORACIC AORTIC ANEURYSM REPAIR N/A 04/04/2019   Procedure: THORACIC ASCENDING ANEURYSM REPAIR (AAA)  USING 28 MM X 30 CM HEMASHIELD PLATINUM VASCULAR GRAFT;  Surgeon: Wonda Olds, MD;  [facility];  Service: Open Heart Surgery;  Laterality: N/A;   HPI:  Pt is a 71 y.o. female with medical history significant of ESRD on HD, PAF on Eliquis, aortic dissection status postrepair, HFrEF, refractory HTN, presented with strokelike right sided deficits (facial droop, slurred speech, right sided weakness). MRI brain showed acute changes to left posterior frontal, anterior parietal lobe, including the central sulcus, the left caudate head, and occipital lobe.    Assessment / Plan / Recommendation  Clinical Impression   Pt initially asleep but was easily woken upon repositioning. Pt unable to state swallow changes as her speech is significantly unintelligible and presents as considerably dysarthric. Oral motor assessment showed significant reduction in right-sided facial ROM and symmetry. Lingual ROM and strength appeared grossly reduced, however, it was more significant on right side. Pt unable to close mouth on command, however, she adequately produced a volitional  swallow. Volitional cough was weak in nature. Velar function appeared [facility]. Her vocal quality was mildly hoarse, however, intensity was adequate. Pt is edentulous, will confirm use of dentures during meals in future sessions. Trials of thin liquids and puree consistency attempted. Pt presents with oral dysphagia with signs of potential pharyngeal dysphagia. She displayed right sided anterior spillage, inability to obtain lip seal, and multiple swallows on all trials of thin liquids. Recommend nectar thick liquids/regular consistency until completion of MBS. ST will follow for swallowing and speech/language goals.    SLP Visit Diagnosis: Dysphagia, unspecified (R13.10)    Aspiration Risk  Moderate aspiration risk    Diet Recommendation Nectar-thick liquid;Regular   Liquid Administration via: Cup;Straw Medication Administration: Crushed with puree Supervision: Staff to assist with self feeding Compensations: Minimize environmental distractions;Slow rate;Small sips/bites Postural Changes: Seated upright at 90 degrees    Other  Recommendations Oral Care Recommendations: Oral care BID    Recommendations for follow up therapy are one component of a multi-disciplinary discharge planning process, led by the attending physician.  Recommendations may be updated based on patient status, additional functional criteria and insurance authorization.  Follow up Recommendations Acute inpatient rehab (3hours/day)      Assistance Recommended at Discharge Intermittent Supervision/Assistance  Functional Status Assessment Patient has had a recent decline in their functional status and demonstrates the ability to make significant improvements  in function in a reasonable and predictable amount of time.  Frequency and Duration            Prognosis Prognosis for Safe Diet Advancement: Good Barriers to Reach Goals: Severity of deficits      Swallow Study   General Date of Onset: 02/23/22 HPI: Pt is a 71  y.o. female with medical history significant of ESRD on HD, PAF on Eliquis, aortic dissection status postrepair, HFrEF, refractory HTN, presented with strokelike right sided deficits (facial droop, slurred speech, right sided weakness). MRI brain showed acute changes to left posterior frontal, anterior parietal lobe, including the central sulcus, the left caudate head, and occipital lobe. Type of Study: Bedside Swallow Evaluation Previous Swallow Assessment:  (BSE 07/2021 reg/thin recommended) Diet Prior to this Study: Regular;Thin liquids Temperature Spikes Noted: No Respiratory Status: Room air History of Recent Intubation: No Behavior/Cognition: Alert;Cooperative;Pleasant mood Oral Cavity Assessment: Within Functional Limits Oral Care Completed by SLP: No Oral Cavity - Dentition: Edentulous Vision: Functional for self-feeding Self-Feeding Abilities: Needs assist Patient Positioning: Upright in bed Baseline Vocal Quality: Hoarse Volitional Cough: Weak (more of a throat clear) Volitional Swallow: Able to elicit    Oral/Motor/Sensory Function Overall Oral Motor/Sensory Function: Severe impairment Facial ROM: Reduced right;Suspected CN VII (facial) dysfunction Facial Symmetry: Abnormal symmetry right;Suspected CN VII (facial) dysfunction Facial Strength: Reduced right;Suspected CN VII (facial) dysfunction Lingual ROM: Reduced right;Reduced left;Suspected CN XII (hypoglossal) dysfunction Lingual Symmetry: Within Functional Limits Lingual Strength: Reduced;Suspected CN XII (hypoglossal) dysfunction Velum: Within Functional Limits   Ice Chips Ice chips: Not tested   Thin Liquid Thin Liquid: Impaired Presentation: Cup;Self Fed Oral Phase Impairments: Reduced labial seal;Reduced lingual movement/coordination Oral Phase Functional Implications: Right anterior spillage;Prolonged oral transit Pharyngeal  Phase Impairments: Multiple swallows;Suspected delayed Swallow    Nectar Thick Nectar  Thick Liquid: Not tested   Honey Thick Honey Thick Liquid: Not tested   Puree Puree: Within functional limits Presentation: Self Fed;Spoon   Solid     Solid: Not tested      [facility] Student SLP  02/24/2022,10:17 AM

## 2022-02-24 NOTE — Evaluation (Signed)
Occupational Therapy Evaluation Patient Details Name: Monique Henderson MRN: [id number] DOB: 26-Jun-1950 Today's Date: 02/24/2022   History of Present Illness 71 y.o. female presents [facility]25 with R sided facial droop, R UE weakness, dysarthria.  MRI with acute to subacute cortical infarct in the L posterior frontal and anterior parietal lobe, smaller acute to subacute infarct in L caudate tail, occipital lobe and frontal lobe. Noted 8/31 admission due to volume overload.  PMH includes ESRD TTS, HTN, PAF, polysubstance abuse, aortic aneurysm dissection s/p repair.   Clinical Impression   PTA patient independent and driving. Admitted for above and presents with problem list below, including R dominant hemiparesis, R inattention, impaired balance, and impaired cognition.  She currently requires min assist for bed mobility, min assist for transfers and limited mobility in room, and up to mod assist for ADLs.  She requires cueing throughout session to attend to R UE, often leaving UE behind her with mobility tasks and ADLS.  She is oriented and follows simple commands, but demonstrates poor awareness, problem solving and safety; at times difficult to understand due to dysarthria given cueing to slow down communication.  Reinforced intermittent supervision with meals with RN and NT, concerned for pocketing in R cheek.  Based on performance today, believe she will best benefit from intensive AIR level rehab to optimize independence, safety and return to PLOF. Will follow acutely.      Recommendations for follow up therapy are one component of a multi-disciplinary discharge planning process, led by the attending physician.  Recommendations may be updated based on patient status, additional functional criteria and insurance authorization.   Follow Up Recommendations  Acute inpatient rehab (3hours/day)    Assistance Recommended at Discharge Frequent or constant Supervision/Assistance  Patient can  return home with the following A little help with walking and/or transfers;A lot of help with bathing/dressing/bathroom;Assistance with cooking/housework;Direct supervision/assist for medications management;Direct supervision/assist for financial management;Assist for transportation;Help with stairs or ramp for entrance    Functional Status Assessment  Patient has had a recent decline in their functional status and demonstrates the ability to make significant improvements in function in a reasonable and predictable amount of time.  Equipment Recommendations  None recommended by OT    Recommendations for Other Services Rehab consult;PT consult     Precautions / Restrictions Precautions Precautions: Fall Precaution Comments: R UE hemiparesis Restrictions Weight Bearing Restrictions: No      Mobility Bed Mobility Overal bed mobility: Needs Assistance Bed Mobility: Supine to Sit, Sit to Supine     Supine to sit: Min assist Sit to supine: Min assist   General bed mobility comments: min assist to bring R UE around to sitting, increased time    Transfers Overall transfer level: Needs assistance Equipment used: None Transfers: Sit to/from Stand Sit to Stand: Min assist           General transfer comment: to power up and steady      Balance Overall balance assessment: Needs assistance Sitting-balance support: No upper extremity supported, Feet supported Sitting balance-Leahy Scale: Fair     Standing balance support: During functional activity, No upper extremity supported Standing balance-Leahy Scale: Poor Standing balance comment: relies on external support                           ADL either performed or assessed with clinical judgement   ADL Overall ADL's : Needs assistance/impaired     Grooming: Minimal assistance;Sitting  Lower Body Bathing: Minimal assistance;Sit to/from stand   Upper Body Dressing : Minimal assistance;Sitting   Lower  Body Dressing: Moderate assistance;Sit to/from stand   Toilet Transfer: Minimal assistance;Ambulation Toilet Transfer Details (indicate cue type and reason): simulated at [place] and Hygiene: Moderate assistance;Sit to/from stand Toileting - Clothing Manipulation Details (indicate cue type and reason): for clothing mgmt on R side, min assist dynamically when completing hygiene due to purewick leaking     Functional mobility during ADLs: Minimal assistance;Cueing for safety General ADL Comments: pt limited by R UE hemiparesis, cognition, balance     Vision Baseline Vision/History: 0 No visual deficits Ability to See in Adequate Light: 0 Adequate Patient Visual Report: No change from baseline Additional Comments: further assessment required, able to locate all items on breakfast tray without assist     Perception     Praxis      Pertinent Vitals/Pain Pain Assessment Pain Assessment: Faces Faces Pain Scale: No hurt     Hand Dominance Right   Extremity/Trunk Assessment Upper Extremity Assessment Upper Extremity Assessment: RUE deficits/detail RUE Deficits / Details: grossly 3-/5 shoulder elevation and retraction, 2/5 shoulder flexion and elbow flexion, 0/5 forearm/hand RUE Sensation: decreased proprioception;decreased light touch RUE Coordination: decreased gross motor;decreased fine motor   Lower Extremity Assessment Lower Extremity Assessment: Defer to PT evaluation       Communication Communication Communication: Expressive difficulties (dysarthric)   Cognition Arousal/Alertness: Awake/alert Behavior During Therapy: Flat affect Overall Cognitive Status: No family/caregiver present to determine baseline cognitive functioning Area of Impairment: Following commands, Awareness, Problem solving, Safety/judgement                       Following Commands: Follows one step commands consistently, Follows one step commands with increased  time, Follows multi-step commands inconsistently Safety/Judgement: Decreased awareness of deficits, Decreased awareness of safety Awareness: Emergent Problem Solving: Slow processing, Difficulty sequencing, Requires verbal cues General Comments: pt oriented, follows simple commands with increased time.  Decreased awareness to deficits and safety.  Pt reports the bed pad is wet, but not her underwear initally; then once changed the bed pad reports she needed to take off her underewar.     General Comments  discussed with RN concern for pocketing food in R cheek, NT to check on patient.    Exercises     Shoulder Instructions      Home Living Family/patient expects to be discharged to:: Private residence Living Arrangements: Non-relatives/Friends Available Help at Discharge: Friend(s) Type of Home: Apartment Home Access: Other (comment) (chair lift to her apartment?)     Home Layout: One level     Bathroom Shower/Tub: Walk-in shower;Sponge bathes at baseline   [employer]: [place]: Conservation officer, nature (2 wheels);Cane - single point;BSC/3in1   Additional Comments: difficult to understand at times- I think this is correct      Prior Functioning/Environment Prior Level of Function : Independent/Modified Independent;Driving;History of Falls (last six months)             Mobility Comments: no AD, reports 1 fall in the last 6 months ADLs Comments: independent ADLs, IADLs, driving (NP from nephrology reports 11th admission this year, medical non compliance)        OT Problem List: Decreased strength;Decreased range of motion;Decreased activity tolerance;Impaired balance (sitting and/or standing);Impaired vision/perception;Decreased coordination;Decreased cognition;Decreased safety awareness;Decreased knowledge of use of DME or AE;Decreased knowledge of precautions;Impaired sensation;Impaired tone;Impaired UE functional use  OT Treatment/Interventions:  Self-care/ADL training;Neuromuscular education;DME and/or AE instruction;Therapeutic activities;Cognitive remediation/compensation;Patient/family education;Balance training;Visual/perceptual remediation/compensation    OT Goals(Current goals can be found in the care plan section) Acute Rehab OT Goals Patient Stated Goal: get better OT Goal Formulation: With patient Time For Goal Achievement: 03/10/22 Potential to Achieve Goals: Good  OT Frequency: Min 2X/week    Co-evaluation              AM-PAC OT "6 Clicks" Daily Activity     Outcome Measure Help from another person eating meals?: A Little Help from another person taking care of personal grooming?: A Little Help from another person toileting, which includes using toliet, bedpan, or urinal?: A Lot Help from another person bathing (including washing, rinsing, drying)?: A Little Help from another person to put on and taking off regular upper body clothing?: A Little Help from another person to put on and taking off regular lower body clothing?: A Lot 6 Click Score: 16   End of Session Nurse Communication: Mobility status;Precautions  Activity Tolerance: Patient tolerated treatment well Patient left: in bed;with call bell/phone within reach;with chair alarm set;Other (comment) (NP in room)  OT Visit Diagnosis: Other abnormalities of gait and mobility (R26.89);Muscle weakness (generalized) (M62.81);History of falling (Z91.81);Cognitive communication deficit (R41.841);Other symptoms and signs involving cognitive function;Hemiplegia and hemiparesis Symptoms and signs involving cognitive functions: Cerebral infarction Hemiplegia - Right/Left: Right Hemiplegia - dominant/non-dominant: Dominant Hemiplegia - caused by: Cerebral infarction                Time: 6389-3734 OT Time Calculation (min): 26 min Charges:  OT General Charges $OT Visit: 1 Visit OT Evaluation $OT Eval Moderate Complexity: 1 Mod OT Treatments $Self Care/Home  Management : 8-22 mins  Jolaine Artist, OT Acute Rehabilitation Services Office [phone]   Delight Stare 02/24/2022, 10:26 AM

## 2022-02-25 ENCOUNTER — Inpatient Hospital Stay (HOSPITAL_COMMUNITY): Payer: Medicare Other

## 2022-02-25 DIAGNOSIS — I6389 Other cerebral infarction: Secondary | ICD-10-CM

## 2022-02-25 DIAGNOSIS — I639 Cerebral infarction, unspecified: Secondary | ICD-10-CM | POA: Diagnosis not present

## 2022-02-25 LAB — ECHOCARDIOGRAM COMPLETE
AR max vel: 2.13 cm2
AV Peak grad: 7.2 mmHg
Ao pk vel: 1.35 m/s
Area-P 1/2: 5.02 cm2
S' Lateral: 3.1 cm
Weight: 1612 oz

## 2022-02-25 LAB — TROPONIN I (HIGH SENSITIVITY): Troponin I (High Sensitivity): 135 ng/L (ref <18)

## 2022-02-25 LAB — HEPATITIS B SURFACE ANTIBODY, QUANTITATIVE: Hep B S AB Quant (Post): 109.1 m[IU]/mL (ref >9.9)

## 2022-02-25 MED ORDER — LABETALOL HCL 5 MG/ML IV SOLN
10.0000 mg | INTRAVENOUS | Status: DC | PRN
  Filled 2022-02-25: qty 4

## 2022-02-25 MED ORDER — HYDRALAZINE HCL 20 MG/ML IJ SOLN
20.0000 mg | Freq: Four times a day (QID) | INTRAMUSCULAR | Status: DC
  Administered 2022-02-25 – 2022-02-26 (×3): 20 mg via INTRAVENOUS
  Filled 2022-02-25 (×4): qty 1

## 2022-02-25 NOTE — Progress Notes (Signed)
[facility] Progress Note   Assessment/ Plan:   Dialysis Orders: Center: [facility] T,Th,S 3:75 hrs [id number]/Autoflow 1.5 45 kg 2.0 K/ 2.0 Ca UFP 2 TDC -No heparin  -No ESA/Venofer -Hectorol 4 mcg IV TIW   Assessment/Plan: Acute vsSubacute Cortical CVA: Dysarthric, unable to move RUE. Neurology consulted. Per primary.  Has a Dysphagia 2 diet now.   Hypertensive Emergency: Select home medications have been resumed. Historically BP has been well controlled while in hospital. Per neuro, allowing permissive HTN, treat BP if SBP > 220.  PAF-Eliquis on hold. Per primary.   ESRD -  T,Th, S. HD on schedule, next 9/28.    Hypertension/volume  - HTN as noted above. No excess volume by exam or chest xray. UF as tolerated.   Anemia  - HGB 11.8. No ESA needed.   Metabolic bone disease -PO4 elevated at OP clinic. Resume binders, VDRA.   Nutrition - Albumin at goal. Renal diet.  Subjective:    Seen in room.  Had more dysphagia last night, possibly d/t lethargy.  Today had SLP eval and is cleared for dysphagia 2 diet.  Just finished eating when I walked in room.  Sisters at bedside.   Objective:   BP (!) 157/97 (BP Location: Left Arm)   Pulse (!) 41   Temp 98.2 F (36.8 C)   Resp 18   Wt 45.7 kg   SpO2 100%   BMI 17.29 kg/m   Physical Exam: Gen:NAD, awake and alert CVS:RRR Resp:clear Abd: soft Ext:no LE edema ACCESS: R IJ TDC NEURO: sig dysarthria with R facial droop and R arm weakness  Labs: BMET Recent Labs  Lab 02/23/22 1444 02/23/22 1846 02/24/22 0411  NA 141  --  140  K 5.9* 4.0 4.0  CL 102  --  105  CO2 26  --  24  GLUCOSE 93  --  149*  BUN 42*  --  47*  CREATININE 8.96*  --  10.03*  CALCIUM 10.5*  --  9.6   CBC Recent Labs  Lab 02/23/22 1846  WBC 4.4  NEUTROABS 2.8  HGB 11.8*  HCT 38.7  MCV 101.6*  PLT 284      Medications:     amLODipine  10 mg Oral Daily   apixaban  2.5 mg Oral BID   carvedilol  6.25 mg Oral BID WC   Chlorhexidine  Gluconate Cloth  6 each Topical Daily   [START ON 02/28/2022] cloNIDine  0.1 mg Transdermal Q Sat   doxercalciferol  4 mcg Intravenous Q T,Th,Sa-HD   hydrALAZINE  20 mg Intravenous Q6H   isosorbide mononitrate  30 mg Oral Daily   rosuvastatin  10 mg Oral Daily     Madelon Lips, MD [facility] [phone] 02/25/2022, 4:15 PM

## 2022-02-25 NOTE — Progress Notes (Addendum)
Inpatient Rehab Admissions Coordinator:   I await decision from Pt.'s son, [name], on whether or not family can provide 24/7 assist at d/c from [place]. I will submit for insurance auth once I hear from him.  Clemens Catholic, [place], [place] Admissions Coordinator  [phone] ([facility]) [phone] (office)

## 2022-02-25 NOTE — Progress Notes (Signed)
Modified Barium Swallow Progress Note  Patient Details  Name: Monique Henderson MRN: [id number] Date of Birth: 02-22-1951  Today's Date: 02/25/2022  Modified Barium Swallow completed.  Full report located under Chart Review in the Imaging Section.  Brief recommendations include the following:  Clinical Impression  Pt alert and attentive throughout study. Pt presents with minimal oropharyngeal dysphagia marked by swallow initiation delay to the pyriform sinuses, decreased bolus cohesion, and right anterior bolus loss. Pt displayed swallow initiation delay with thin liquids and pill administration (whole in puree). Right anterior loss, anterior sulcus pocketing, and decreased bolus cohesion noted with thin liquids. Hyolaryngeal excursion, pharyngeal contractions and epiglottic inversion adequate for airway protection. No airway intrusion noted throughout study. Despite noted right sided facial weakness, no buccal pocketing of bolus present. Pt edentulous, however, mastication of regular consistency presents as functional. Per chart review, RN reports pt had worsening dysphagia overnight (9/26) marked by increased coughing possibly due to lethargic state. Recommend pt start thin liquid/dys 2 (chopped/minced) diet. Full supervision to monitor alertness, ensure pt upright while eating and crush pills. ST will follow for diet appropriateness/advancement.   Swallow Evaluation Recommendations       SLP Diet Recommendations: Thin liquid;Dysphagia 2 (Fine chop) solids   Liquid Administration via: Straw;Cup   Medication Administration: Crushed with puree   Supervision: Full supervision/cueing for compensatory strategies   Compensations: Small sips/bites;Slow rate;Minimize environmental distractions;Monitor for anterior loss   Postural Changes: Seated upright at 90 degrees   Oral Care Recommendations: Oral care BID        Houston Siren 02/25/2022,2:39 PM

## 2022-02-25 NOTE — Progress Notes (Signed)
Mobility Specialist: Progress Note   02/25/22 1518  Mobility  Activity Refused mobility   Pt refused mobility, no reason specified. Attempted to see x2. Will f/u as able.   [facility] Brendolyn Stockley Mobility Specialist Mobility Specialist [address]: [phone]

## 2022-02-25 NOTE — Progress Notes (Addendum)
Pt had sudden sharp chest pain 10/10 that radiated to the left side of chest under breast bone. Vitals stable and in chart EKG obtained and shows NSR with PACs possible left atrial enlargement , right bundle branch block. Provider Shalhoub aware, see new orders. Pt now resting comfortably and states that chest pain comes and goes. PRN tylenol given as requested by pt. Will continue to monitor , call bell within reach.    02/24/22 2156  Vitals  Temp 97.8 F (36.6 C)  Temp Source Axillary  BP (!) 183/115  BP Location Left Arm  BP Method Automatic  Patient Position (if appropriate) Lying  Pulse Rate 82  Pulse Rate Source Monitor  Resp 18  Level of Consciousness  Level of Consciousness Alert  MEWS COLOR  MEWS Score Color Green  Oxygen Therapy  SpO2 100 %  O2 Device Room Air  MEWS Score  MEWS Temp 0  MEWS Systolic 0  MEWS Pulse 0  MEWS RR 0  MEWS LOC 0  MEWS Score 0

## 2022-02-25 NOTE — Progress Notes (Signed)
Discussed with provider Shalhoub about worsening dysphagia . Pt given prn tylenol crushed in apple sauce with  nectar thick water and HOB '@90'$  . Pt had difficulty swallowing water, heard gurgling noises checked back of pt;s mouth and could see water sitting at back of mouth. Pt was finally able to swallow water with coaching. The prior night pt was on renal diet and able to eat a sandwich without issue. See new orders , will continue to monitor call bell within reach.

## 2022-02-25 NOTE — Progress Notes (Signed)
PROGRESS NOTE    Monique Henderson  NFA:[id number] DOB: Feb 12, 1951 DOA: 02/23/2022 PCP: Merryl Hacker, No   Brief Narrative:  HPI: Monique Henderson is a 71 y.o. female with medical history significant of ESRD on HD TTS, PAF on Eliquis, aortic dissection status postrepair, HFrEF, refractory HTN, presented with strokelike symptoms.   Symptoms started yesterday evening around 9:00, patient suddenly developed right-sided facial droop and right arm weakness.  She chose to not come in the hospital and went to bed.  She also noticed that when she tried to eat or drink something , she started to cough right after.  As result, she did not eat or drink anything since yesterday evening.  This morning, patient woke up with persistent right-sided facial droop and right arm weakness.  She also developed slurred speech when talking to her sister over the phone.  She claimed that she is been taking her blood pressure medications and Eliquis every day, last time she took Eliquis was yesterday evening ED Course: Blood pressure significant elevated SBP> 200, DBP> 130, CT head negative for acute findings.  Blood work K5.9, no significant ST changes.    Assessment & Plan:   Principal Problem:   Stroke [facility]) Active Problems:   Hyperkalemia   AF (paroxysmal atrial fibrillation) (HCC)   ESRD on dialysis ([place])   DNR (do not resuscitate)   Stroke (cerebrum) ([place])  Right upper extremity paresis and right-sided facial droop and dysarthria/acute ischemic stroke: Acute to subacute cortical infarct in the left posterior frontal and anterior parietal lobe, including the central sulcus. Additional focal acute to subacute infarct involving the left caudate head. Additional smaller acute to subacute infarcts involving the left caudate tail, occipital lobe, and frontal lobe.  Neurology on board, per them, likely cardioembolic.  Echo pending.  Continue Eliquis.  PT OT recommends CIR.  Events noted when late night on 02/24/2022,  patient was noted to have increasing difficulty with swallowing despite evaluation by the SLP earlier.  Night hospitalist discussed with the neurology, repeat MRI was done which essentially did not show anything new.  Patient was placed NPO.  SLP seeing patient.  MBS is scheduled for today.  HTN emergency: Blood pressure improving but still elevated.  She is on amlodipine 10 mg p.o. daily, Coreg 6.25 mg p.o. twice daily and Imdur 30 mg p.o. daily however all of those medications are on hold due to patient's n.p.o. status.  Blood pressure is elevated.  I will start her on hydralazine 20 mg every 6 hours scheduled and as needed labetalol.     Hyperkalemia: Resolved.  ESRD on HD -Last dialysis last Saturday, nephrology consulted.  Dialysis today.   Chronic HFrEF: Stable.   PAF: Continue Eliquis and Coreg.   History of cocaine abuse -Denied recent cocaine use.  UDS ordered but sample not collected yet.   Chest pain: Overnight on 02/24/2022, she complained of chest pain.  Troponins were checked which were elevated but flat, these are chronically elevated due to ESRD status.  No ACS.  DVT prophylaxis: apixaban (ELIQUIS) tablet 2.5 mg Start: 02/24/22 1000   Code Status: DNR  Family Communication:  None present at bedside.  Plan of care discussed with patient in length and he/she verbalized understanding and agreed with it.  Status is: Inpatient Remains inpatient appropriate because: Needs placement to CIR.   Estimated body mass index is 17.29 kg/m as calculated from the following:   Height as of 01/29/22: '5\' 4"'$  (1.626 m).   Weight as of  this encounter: 45.7 kg.    Nutritional Assessment: Body mass index is 17.29 kg/m.Marland Kitchen Seen by dietician.  I agree with the assessment and plan as outlined below: Nutrition Status:        . Skin Assessment: I have examined the patient's skin and I agree with the wound assessment as performed by the wound care RN as outlined below:    Consultants:   Neurology and nephrology  Procedures:  None  Antimicrobials:  Anti-infectives (From admission, onward)    None         Subjective:  Patient seen and examined.  She has no complaints.  Continues to have dysarthria. Objective: Vitals:   02/24/22 2123 02/24/22 2156 02/25/22 0335 02/25/22 0815  BP: (!) 190/114 (!) 183/115 (!) 181/119 (!) 171/108  Pulse: 82 82 89 82  Resp: '16 18 17 18  '$ Temp: 98.9 F (37.2 C) 97.8 F (36.6 C) 97.6 F (36.4 C) 98.4 F (36.9 C)  TempSrc: Oral Axillary Axillary   SpO2: 100% 100% 100% 99%  Weight:        Intake/Output Summary (Last 24 hours) at 02/25/2022 1145 Last data filed at 02/24/2022 1652 Gross per 24 hour  Intake --  Output 2900 ml  Net -2900 ml   Filed Weights   02/24/22 1245 02/24/22 1715  Weight: 48.9 kg 45.7 kg    Examination:  General exam: Appears calm and comfortable  Respiratory system: Clear to auscultation. Respiratory effort normal. Cardiovascular system: S1 & S2 heard, RRR. No JVD, murmurs, rubs, gallops or clicks. No pedal edema. Gastrointestinal system: Abdomen is nondistended, soft and nontender. No organomegaly or masses felt. Normal bowel sounds heard. Central nervous system: Alert, unsure about orientation as she has slurred speech.  Right-sided facial droop 3/5 power in right upper extremity.   Data Reviewed: I have personally reviewed following labs and imaging studies  CBC: Recent Labs  Lab 02/23/22 1846  WBC 4.4  NEUTROABS 2.8  HGB 11.8*  HCT 38.7  MCV 101.6*  PLT 562    Basic Metabolic Panel: Recent Labs  Lab 02/23/22 1444 02/23/22 1846 02/24/22 0411  NA 141  --  140  K 5.9* 4.0 4.0  CL 102  --  105  CO2 26  --  24  GLUCOSE 93  --  149*  BUN 42*  --  47*  CREATININE 8.96*  --  10.03*  CALCIUM 10.5*  --  9.6    GFR: Estimated Creatinine Clearance: 3.8 mL/min (A) (by C-G formula based on SCr of 10.03 mg/dL (H)). Liver Function Tests: Recent Labs  Lab 02/23/22 1444  AST 44*   ALT 12  ALKPHOS 78  BILITOT 0.6  PROT 7.5  ALBUMIN 4.1    No results for input(s): "LIPASE", "AMYLASE" in the last 168 hours. No results for input(s): "AMMONIA" in the last 168 hours. Coagulation Profile: Recent Labs  Lab 02/23/22 1444  INR 1.0    Cardiac Enzymes: No results for input(s): "CKTOTAL", "CKMB", "CKMBINDEX", "TROPONINI" in the last 168 hours. BNP (last 3 results) No results for input(s): "PROBNP" in the last 8760 hours. HbA1C: Recent Labs    02/23/22 1846  HGBA1C 4.7*    CBG: Recent Labs  Lab 02/23/22 1357 02/23/22 1755  GLUCAP 106* 103*    Lipid Profile: Recent Labs    02/24/22 0411  CHOL 104  HDL 49  LDLCALC 46  TRIG 43  CHOLHDL 2.1    Thyroid Function Tests: No results for input(s): "TSH", "T4TOTAL", "FREET4", "T3FREE", "THYROIDAB" in  the last 72 hours. Anemia Panel: No results for input(s): "VITAMINB12", "FOLATE", "FERRITIN", "TIBC", "IRON", "RETICCTPCT" in the last 72 hours. Sepsis Labs: No results for input(s): "PROCALCITON", "LATICACIDVEN" in the last 168 hours.  No results found for this or any previous visit (from the past 240 hour(s)).   Radiology Studies: MR BRAIN WO CONTRAST  Result Date: 02/25/2022 CLINICAL DATA:  Stroke-like symptoms; right-sided facial droop and right arm weakness EXAM: MRI HEAD WITHOUT CONTRAST TECHNIQUE: Multiplanar, multiecho pulse sequences of the brain and surrounding structures were obtained without intravenous contrast. COMPARISON:  02/23/2022 MRA head FINDINGS: Brain: Redemonstrated restricted diffusion with ADC correlate in the left posterior frontal and anterior parietal lobe cortex left caudate head, left occipital lobe cortex and white matter, left caudate tail, and left frontal lobe white matter. Increased conspicuity of additional punctate cortical infarcts in the left frontal lobe (series 5, image 78 and left temporal lobe (series 5, image 65), which in retrospect were present on the prior exam but  less hyperintense. All of these foci are associated with increased T2 hyperintense signal. No acute hemorrhage, mass mass effect, or midline shift. No hydrocephalus or extra-axial collection. Redemonstrated scattered foci of susceptibility in the bilateral cerebral hemispheres and deep gray structures, likely sequela of prior infarcts and microhemorrhages. Confluent T2 hyperintense signal in the periventricular white matter, likely the sequela of severe chronic small vessel ischemic disease. Vascular: Normal arterial flow voids. Skull and upper cervical spine: Normal marrow signal. Sinuses/Orbits: No acute finding. Other: The mastoids are well aerated. Fluid in the left petrous apex. IMPRESSION: Redemonstrated acute infarcts in the left cerebral hemisphere, with a few punctate infarcts in the left frontal and temporal lobes likely present on the prior exam in retrospect but now increased in conspicuity. No definite new acute infarct. No evidence of hemorrhagic transformation, mass effect, or midline shift. Electronically Signed   By: Merilyn Baba M.D.   On: 02/25/2022 02:33   DG CHEST PORT 1 VIEW  Result Date: 02/24/2022 CLINICAL DATA:  Congestive heart failure. EXAM: PORTABLE CHEST 1 VIEW COMPARISON:  February 23, 2022. FINDINGS: Stable cardiomegaly. Right internal jugular Port-A-Cath is unchanged in position. Lungs are clear. Bony thorax is unremarkable. IMPRESSION: No active disease. Electronically Signed   By: Marijo Conception M.D.   On: 02/24/2022 08:59   MR BRAIN WO CONTRAST  Result Date: 02/23/2022 CLINICAL DATA:  Stroke-like symptoms; right upper extremity weakness, right facial droop, dysarthria EXAM: MRI HEAD WITHOUT CONTRAST MRA HEAD WITHOUT CONTRAST MRA NECK WITHOUT CONTRAST TECHNIQUE: Multiplanar, multi-echo pulse sequences of the brain and surrounding structures were acquired without intravenous contrast. Angiographic images of the Circle of Willis were acquired using MRA technique without  intravenous contrast. Angiographic images of the neck were acquired using MRA technique withoutintravenous contrast. Carotid stenosis measurements (when applicable) are obtained utilizing NASCET criteria, using the distal internal carotid diameter as the denominator. COMPARISON:  MRI/MRA head 05/21/2019, correlation is made with CT head 02/23/2022 FINDINGS: MRI HEAD FINDINGS Brain: Cortical restricted diffusion with ADC correlate in the left posterior MCA territory, involving the left posterior frontal and anterior parietal lobes (series 5, image 87 and series 7, image 51), including the central sulcus. Focal restricted diffusion with ADC correlate in the left caudate head (series 5, image 78). Smaller foci of restricted diffusion with ADC correlates in the left occipital lobe cortex and white matter (series 5, images 67, 68, 78), left caudate taill(series 5, image 78), and left frontal lobe (series 5, image 86). These areas are associated  with increased T2 hyperintense signal. No acute hemorrhage, mass, mass effect, or midline shift. Scattered foci susceptibility in the bilateral cerebral hemispheres and deep gray structures, likely sequela of prior infarcts and microhemorrhages. Confluent T2 hyperintense signal in the periventricular white matter, likely the sequela of severe chronic small vessel ischemic disease. No hydrocephalus or extra-axial collection. Vascular: Please see MRA findings below. Skull and upper cervical spine: Normal marrow signal. Sinuses/Orbits: No acute finding. Other: The mastoids are well aerated. MRA HEAD FINDINGS Anterior circulation: Both internal carotid arteries are patent to the termini, without significant stenosis. A1 segments patent. Normal anterior communicating artery. Anterior cerebral arteries are patent to their distal aspects. No M1 stenosis or occlusion. Normal MCA bifurcations. Distal MCA branches perfused and symmetric. Posterior circulation: Evaluation of the proximal  vertebral arteries is limited by motion artifact. Vertebral arteries otherwise patent to the vertebrobasilar junction without stenosis. Basilar patent to its distal aspect. Superior cerebellar arteries patent bilaterally. Patent P1 segments. PCAs perfused to their distal aspects without stenosis. The bilateral posterior communicating arteries are not visualized. Anatomic variants: None significant MRA NECK FINDINGS Evaluation is somewhat limited by motion artifact. No evidence of dissection, occlusion, or hemodynamically significant stenosis (greater than 50%). Suspect beading of the right-greater-than-left distal ICAs bilaterally (series 6, images 181-224 on the right and 216-228 on the left). IMPRESSION: 1. 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 Additional focal acute to subacute infarct involving the left caudate head 2. Additional smaller acute to subacute infarcts involving the left caudate tail, occipital lobe, and frontal lobe. 3. No definite hemodynamically significant stenosis in the neck. Possible beading of the right-greater-than-left distal ICAs, concerning for fibromuscular dysplasia. This could be better evaluated with a CTA of the neck if clinically indicated. 4. No intracranial large vessel occlusion or significant stenosis. These results were called by telephone at the time of interpretation on 02/23/2022 at 9:24 pm to provider Dr. Marlyce Huge, who verbally acknowledged these results. Electronically Signed   By: Merilyn Baba M.D.   On: 02/23/2022 21:24   MR ANGIO HEAD WO CONTRAST  Result Date: 02/23/2022 CLINICAL DATA:  Stroke-like symptoms; right upper extremity weakness, right facial droop, dysarthria EXAM: MRI HEAD WITHOUT CONTRAST MRA HEAD WITHOUT CONTRAST MRA NECK WITHOUT CONTRAST TECHNIQUE: Multiplanar, multi-echo pulse sequences of the brain and surrounding structures were acquired without intravenous  contrast. Angiographic images of the Circle of Willis were acquired using MRA technique without intravenous contrast. Angiographic images of the neck were acquired using MRA technique withoutintravenous contrast. Carotid stenosis measurements (when applicable) are obtained utilizing NASCET criteria, using the distal internal carotid diameter as the denominator. COMPARISON:  MRI/MRA head 05/21/2019, correlation is made with CT head 02/23/2022 FINDINGS: MRI HEAD FINDINGS Brain: Cortical restricted diffusion with ADC correlate in the left posterior MCA territory, involving the left posterior frontal and anterior parietal lobes (series 5, image 87 and series 7, image 51), including the central sulcus. Focal restricted diffusion with ADC correlate in the left caudate head (series 5, image 78). Smaller foci of restricted diffusion with ADC correlates in the left occipital lobe cortex and white matter (series 5, images 67, 68, 78), left caudate taill(series 5, image 78), and left frontal lobe (series 5, image 86). These areas are associated with increased T2 hyperintense signal. No acute hemorrhage, mass, mass effect, or midline shift. Scattered foci susceptibility in the bilateral cerebral hemispheres and deep gray structures, likely sequela of prior infarcts and microhemorrhages. Confluent T2  hyperintense signal in the periventricular white matter, likely the sequela of severe chronic small vessel ischemic disease. No hydrocephalus or extra-axial collection. Vascular: Please see MRA findings below. Skull and upper cervical spine: Normal marrow signal. Sinuses/Orbits: No acute finding. Other: The mastoids are well aerated. MRA HEAD FINDINGS Anterior circulation: Both internal carotid arteries are patent to the termini, without significant stenosis. A1 segments patent. Normal anterior communicating artery. Anterior cerebral arteries are patent to their distal aspects. No M1 stenosis or occlusion. Normal MCA bifurcations.  Distal MCA branches perfused and symmetric. Posterior circulation: Evaluation of the proximal vertebral arteries is limited by motion artifact. Vertebral arteries otherwise patent to the vertebrobasilar junction without stenosis. Basilar patent to its distal aspect. Superior cerebellar arteries patent bilaterally. Patent P1 segments. PCAs perfused to their distal aspects without stenosis. The bilateral posterior communicating arteries are not visualized. Anatomic variants: None significant MRA NECK FINDINGS Evaluation is somewhat limited by motion artifact. No evidence of dissection, occlusion, or hemodynamically significant stenosis (greater than 50%). Suspect beading of the right-greater-than-left distal ICAs bilaterally (series 6, images 181-224 on the right and 216-228 on the left). IMPRESSION: 1. 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 Additional focal acute to subacute infarct involving the left caudate head 2. Additional smaller acute to subacute infarcts involving the left caudate tail, occipital lobe, and frontal lobe. 3. No definite hemodynamically significant stenosis in the neck. Possible beading of the right-greater-than-left distal ICAs, concerning for fibromuscular dysplasia. This could be better evaluated with a CTA of the neck if clinically indicated. 4. No intracranial large vessel occlusion or significant stenosis. These results were called by telephone at the time of interpretation on 02/23/2022 at 9:24 pm to provider Dr. Marlyce Huge, who verbally acknowledged these results. Electronically Signed   By: Merilyn Baba M.D.   On: 02/23/2022 21:24   MR ANGIO NECK WO CONTRAST  Result Date: 02/23/2022 CLINICAL DATA:  Stroke-like symptoms; right upper extremity weakness, right facial droop, dysarthria EXAM: MRI HEAD WITHOUT CONTRAST MRA HEAD WITHOUT CONTRAST MRA NECK WITHOUT CONTRAST TECHNIQUE: Multiplanar, multi-echo  pulse sequences of the brain and surrounding structures were acquired without intravenous contrast. Angiographic images of the Circle of Willis were acquired using MRA technique without intravenous contrast. Angiographic images of the neck were acquired using MRA technique withoutintravenous contrast. Carotid stenosis measurements (when applicable) are obtained utilizing NASCET criteria, using the distal internal carotid diameter as the denominator. COMPARISON:  MRI/MRA head 05/21/2019, correlation is made with CT head 02/23/2022 FINDINGS: MRI HEAD FINDINGS Brain: Cortical restricted diffusion with ADC correlate in the left posterior MCA territory, involving the left posterior frontal and anterior parietal lobes (series 5, image 87 and series 7, image 51), including the central sulcus. Focal restricted diffusion with ADC correlate in the left caudate head (series 5, image 78). Smaller foci of restricted diffusion with ADC correlates in the left occipital lobe cortex and white matter (series 5, images 67, 68, 78), left caudate taill(series 5, image 78), and left frontal lobe (series 5, image 86). These areas are associated with increased T2 hyperintense signal. No acute hemorrhage, mass, mass effect, or midline shift. Scattered foci susceptibility in the bilateral cerebral hemispheres and deep gray structures, likely sequela of prior infarcts and microhemorrhages. Confluent T2 hyperintense signal in the periventricular white matter, likely the sequela of severe chronic small vessel ischemic disease. No hydrocephalus or extra-axial collection. Vascular: Please see MRA findings below. Skull and upper cervical spine: Normal marrow  signal. Sinuses/Orbits: No acute finding. Other: The mastoids are well aerated. MRA HEAD FINDINGS Anterior circulation: Both internal carotid arteries are patent to the termini, without significant stenosis. A1 segments patent. Normal anterior communicating artery. Anterior cerebral arteries  are patent to their distal aspects. No M1 stenosis or occlusion. Normal MCA bifurcations. Distal MCA branches perfused and symmetric. Posterior circulation: Evaluation of the proximal vertebral arteries is limited by motion artifact. Vertebral arteries otherwise patent to the vertebrobasilar junction without stenosis. Basilar patent to its distal aspect. Superior cerebellar arteries patent bilaterally. Patent P1 segments. PCAs perfused to their distal aspects without stenosis. The bilateral posterior communicating arteries are not visualized. Anatomic variants: None significant MRA NECK FINDINGS Evaluation is somewhat limited by motion artifact. No evidence of dissection, occlusion, or hemodynamically significant stenosis (greater than 50%). Suspect beading of the right-greater-than-left distal ICAs bilaterally (series 6, images 181-224 on the right and 216-228 on the left). IMPRESSION: 1. 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 Additional focal acute to subacute infarct involving the left caudate head 2. Additional smaller acute to subacute infarcts involving the left caudate tail, occipital lobe, and frontal lobe. 3. No definite hemodynamically significant stenosis in the neck. Possible beading of the right-greater-than-left distal ICAs, concerning for fibromuscular dysplasia. This could be better evaluated with a CTA of the neck if clinically indicated. 4. No intracranial large vessel occlusion or significant stenosis. These results were called by telephone at the time of interpretation on 02/23/2022 at 9:24 pm to provider Dr. Marlyce Huge, who verbally acknowledged these results. Electronically Signed   By: Merilyn Baba M.D.   On: 02/23/2022 21:24   DG Chest Portable 1 View  Result Date: 02/23/2022 CLINICAL DATA:  Stroke EXAM: PORTABLE CHEST 1 VIEW COMPARISON:  Radiograph 01/29/2022 FINDINGS: Unchanged enlarged cardiomediastinal  silhouette with prior median sternotomy. Right neck catheter tip overlies the right atrium. There is no focal airspace consolidation. There is no pleural effusion. No pneumothorax. No acute osseous abnormality. IMPRESSION: Unchanged cardiomegaly.  No new airspace disease. Electronically Signed   By: Maurine Simmering M.D.   On: 02/23/2022 15:06   CT Head Wo Contrast  Result Date: 02/23/2022 CLINICAL DATA:  RUE weakness, right facial droop, dysarthria EXAM: CT HEAD WITHOUT CONTRAST TECHNIQUE: Contiguous axial images were obtained from the base of the skull through the vertex without intravenous contrast. RADIATION DOSE REDUCTION: This exam was performed according to the departmental dose-optimization program which includes automated exposure control, adjustment of the mA and/or kV according to patient size and/or use of iterative reconstruction technique. COMPARISON:  CT head 12/24/2021. FINDINGS: Brain: Subtle high left frontoparietal hypoattenuation and possible loss of gray differentiation (for example see series 2, image 19 and series 5, image 33). No evidence of acute hemorrhage, mass lesion midline shift or hydrocephalus. Patchy white matter hypoattenuation, nonspecific but compatible with chronic microvascular disease. Vascular: Calcific atherosclerosis. No convincing hyperdense vessel identified. Skull: No acute fracture. Sinuses/Orbits: Clear sinuses.  No acute orbital findings. Other: No mastoid effusions. Dr. Leonie Man paged at the time of dictation for call of report. IMPRESSION: 1. Subtle high left frontoparietal hypoattenuation and possible loss of gray differentiation, which could represent acute infarct (particularly given the clinical history) or artifact. Recommend MRI. 2. No acute hemorrhage. Electronically Signed   By: Margaretha Sheffield M.D.   On: 02/23/2022 14:32    Scheduled Meds:  amLODipine  10 mg Oral Daily   apixaban  2.5 mg Oral BID  carvedilol  6.25 mg Oral BID WC   Chlorhexidine  Gluconate Cloth  6 each Topical Daily   [START ON 02/28/2022] cloNIDine  0.1 mg Transdermal Q Sat   doxercalciferol  4 mcg Intravenous Q T,Th,Sa-HD   isosorbide mononitrate  30 mg Oral Daily   rosuvastatin  10 mg Oral Daily   Continuous Infusions:   LOS: 2 days   Darliss Cheney, MD [facility]  02/25/2022, 11:45 AM   *Please note that this is a verbal dictation therefore any spelling or grammatical errors are due to the "[place] [employer]" system interpretation.  Please page via [place] and do not message via secure chat for urgent patient care matters. Secure chat can be used for non urgent patient care matters.  How to contact the [facility] Attending or Consulting provider [place] or covering provider during after hours [place], for this patient?  Check the care team in [facility] and look for a) attending/consulting TRH provider listed and b) the [facility] team listed. Page or secure chat [phone]. Log into [email or web address] and use [place]'s universal password to access. If you do not have the password, please contact the hospital operator. Locate the [facility] provider you are looking for under [facility] and page to a number that you can be directly reached. If you still have difficulty reaching the provider, please page the [facility] (Director on Call) for the Hospitalists listed on amion for assistance.

## 2022-02-25 NOTE — PMR Pre-admission (Signed)
PMR Admission Coordinator Pre-Admission Assessment  Patient: Monique Henderson is an 71 y.o., female MRN: [id number] DOB: September 14, 1950 Height: 5'4" Weight: 45.7 kg  Insurance Information HMO:  yes    PPO:      PCP:      IPA:      80/20:      OTHER:  PRIMARY: UHC Medicare       Policy#: [id number]      Subscriber: Pt.  CM Name:       Phone#: [phone]     Fax#: [phone] Pre-Cert#: [id number] approved for 14 days from 9/28 to 03/11/22 and update due 03/11/22     Employer:  Benefits:  Phone #: [phone]     Name: Checked through [facility] provider portal Eff Date: 01/30/2022 - still active Deductible: does not have one OOP Max: $8,300 [phone] met) CIR: $1,556/ admission copay SNF: $0.00 Copayment per day for days 1-20; $200 Copayment per day for days 21-100; Maximum of 100 days/benefit period Outpatient: 80% coverage; 20% co-insurance Home Health:  100% coverage, limited by medical necessity DME: 80% coverage; 20% co-insurance Providers: in network   SECONDARY: Medicaid       Policy#: [id number]      Phone#:   Financial Counselor:       Phone#:   The "Data Collection Information Summary" for patients in Inpatient Rehabilitation Facilities with attached "Privacy Act [place] Records" was provided and verbally reviewed with: Patient  Emergency Contact Information Contact Information     Name Relation Home Work Mobile   [place] Son   [phone]   Fox,Lurlene Sister   [phone]   Teti,Steve Significant other   [phone]       Current Medical History  Patient Admitting Diagnosis: L CVA  History of Present Illness: Monique Henderson is a 71 y.o. female with medical history significant of ESRD on HD TTS, PAF on Eliquis, aortic dissection status postrepair, HFrEF, refractory HTN, presented with strokelike symptoms 02/23/22. MRI revealing for Acute infarcts in left posterior frontal and parietal lobe, left caudate head, left caudate tail, occipital lobe  and frontal lobe. Neurology  feels that it etiology is  likely cardioembolic in setting of atrial fibrillation on anticoagulation. MRA head and neck showed no LVO or significant stenosis. Pt. Seen by PT/OT/SLP who recommended CIR to assist return to PLOF.   Complete NIHSS TOTAL: 8  [facility] has been reviewed by the rehabilitation admission coordinator and physician.  Past Medical History  Past Medical History:  Diagnosis Date   AF (paroxysmal atrial fibrillation) ([place]) 05/29/2019   on Coumadin   Aortic atherosclerosis ([place]) 07/05/2019   Aortic dissection ([place]) 04/04/2019   s/p repair   Bone spur 2008   Right calcaneal foot spur   Breast cancer ([place]) 2004   Ductal carcinoma in situ of the left breast; S/P left partial mastectomy 02/26/2003; S/P re-excision of cranial and lateral margins11/18/2004.radiation   Cerebral thrombosis with cerebral infarction 05/22/2019   Chronic low back pain 06/22/2016   Chronic obstructive lung disease ([place]) 01/16/2017   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 Autumn Messing; S/P re-excision of superior and inferior margins 10/27/2012.    ESRD on hemodialysis ([place]) 05/29/2019   Essential hypertension 09/16/2006   GERD 09/16/2006   Hepatitis C    treated and RNA confirmed not detectable 01/2017   Insomnia 03/14/2015   Malnutrition of moderate degree 05/19/2019   Non compliance  with medical treatment 12/04/2017   Normocytic anemia    With thrombocytosis   Osteoarthritis    Right ureteral stone 2002   S/P lumbar spinal fusion 01/18/2014   S/P lumbar decompressive laminectomy, fusion, and plating for lumbar spinal stenosis on 05/27/2009 by Dr. Eustace Moore.  S/P anterolateral retroperitoneal interbody fusion L2-3 utilizing a 8 mm peek interbody cage packed with morcellized allograft, and anterior lumbar plating L2-3 for recurrent disc herniation L2-3  with spinal stenosis on 01/18/2014 by Dr. Eustace Moore.     Tobacco use disorder 04/19/2009   Uterine fibroid    Wears dentures    top    Has the patient had major surgery during 100 days prior to admission? No  Family History   family history includes Bone cancer (age of onset: 70) in her sister; Colon cancer (age of onset: 71) in her mother; Diabetes in her brother, brother, and son; Diabetes (age of onset: 87) in her sister; Hypertension in her brother, brother, mother, sister, and son; Kidney disease in her son; Multiple sclerosis in her son.  Current Medications  Current Facility-Administered Medications:    acetaminophen (TYLENOL) tablet 650 mg, 650 mg, Oral, Q4H PRN, 650 mg at 02/24/22 2319 **OR** acetaminophen (TYLENOL) 160 MG/5ML solution 650 mg, 650 mg, Per Tube, Q4H PRN **OR** acetaminophen (TYLENOL) suppository 650 mg, 650 mg, Rectal, Q4H PRN, Wynetta Fines T, MD   albuterol (PROVENTIL) (2.5 MG/3ML) 0.083% nebulizer solution 2.5 mg, 2.5 mg, Nebulization, Q6H PRN, [place], Ping T, MD   amLODipine (NORVASC) tablet 10 mg, 10 mg, Oral, Daily, Pahwani, Ravi, MD, 10 mg at 02/24/22 1028   apixaban (ELIQUIS) tablet 2.5 mg, 2.5 mg, Oral, BID, Pahwani, Ravi, MD, 2.5 mg at 02/24/22 2135   carvedilol (COREG) tablet 6.25 mg, 6.25 mg, Oral, BID WC, Pahwani, Ravi, MD, 6.25 mg at 02/24/22 1736   Chlorhexidine Gluconate Cloth 2 % PADS 6 each, 6 each, Topical, Daily, Pahwani, Ravi, MD, 6 each at 02/24/22 1028   [START ON 02/28/2022] cloNIDine (CATAPRES - Dosed in mg/24 hr) patch 0.1 mg, 0.1 mg, Transdermal, Q Sat, Zhang, Ping T, MD   doxercalciferol (HECTOROL) injection 4 mcg, 4 mcg, Intravenous, Q T,Th,Sa-HD, Valentina Gu, NP, 4 mcg at 02/24/22 1624   hydrALAZINE (APRESOLINE) injection 10 mg, 10 mg, Intravenous, Q6H PRN, Shalhoub, Sherryll Burger, MD   hydrALAZINE (APRESOLINE) injection 20 mg, 20 mg, Intravenous, Q6H, Pahwani, Ravi, MD, 20 mg at 02/25/22 1219   isosorbide mononitrate (IMDUR) 24 hr  tablet 30 mg, 30 mg, Oral, Daily, Pahwani, Ravi, MD, 30 mg at 02/24/22 1736   labetalol (NORMODYNE) injection 10 mg, 10 mg, Intravenous, Q2H PRN, Pahwani, Ravi, MD   LORazepam (ATIVAN) injection 0.5 mg, 0.5 mg, Intravenous, Q4H PRN, [place], Ping T, MD, 0.5 mg at 02/25/22 0116   rosuvastatin (CRESTOR) tablet 10 mg, 10 mg, Oral, Daily, Pahwani, Ravi, MD, 10 mg at 02/24/22 1028  Patients Current Diet:  Diet Order             DIET DYS 2 Room service appropriate? Yes; Fluid consistency: Thin  Diet effective now                   Precautions / Restrictions Precautions Precautions: Fall Precaution Comments: R UE hemiparesis Restrictions Weight Bearing Restrictions: No   Has the patient had 2 or more falls or a fall with injury in the past year? No  Prior Activity Level  Pt. Was active in the community PTA   Prior  Functional Level Self Care: Did the patient need help bathing, dressing, using the toilet or eating? Independent  Indoor Mobility: Did the patient need assistance with walking from room to room (with or without device)? Independent  Stairs: Did the patient need assistance with internal or external stairs (with or without device)? Independent  Functional Cognition: Did the patient need help planning regular tasks such as shopping or remembering to take medications? Independent  Patient Information Are you of Hispanic, Latino/a,or Spanish origin?: X. Patient unable to respond What is your race?: B. Black or African American (answer from proxy) Do you need or want an interpreter to communicate with a doctor or health care staff?: 0. No  Patient's Response To:  Health Literacy and Transportation Is the patient able to respond to health literacy and transportation needs?: No Health Literacy - How often do you need to have someone help you when you read instructions, pamphlets, or other written material from your doctor or pharmacy?: Never (answered by proxy) In the past 12  months, has lack of transportation kept you from medical appointments or from getting medications?: No In the past 12 months, has lack of transportation kept you from meetings, work, or from getting things needed for daily living?: No  Development worker, international aid / [place] Devices/Equipment: None Home Equipment: Conservation officer, nature (2 wheels), [employer] - single point, BSC/3in1  Prior Device Use: Indicate devices/aids used by the patient prior to current illness, exacerbation or injury? None of the above  Current Functional Level Cognition  Arousal/Alertness: Awake/alert Overall Cognitive Status: Impaired/Different from baseline Orientation Level: Oriented to person, Oriented to situation Following Commands: Follows one step commands consistently, Follows one step commands with increased time, Follows multi-step commands inconsistently Safety/Judgement: Decreased awareness of deficits, Decreased awareness of safety General Comments: pt oriented, follows simple commands with increased time.  Decreased awareness to deficits and safety.  Pt reports the bed pad is wet, but not her underwear initally; then once changed the bed pad reports she needed to take off her underewar. Attention: Focused Focused Attention: Appears intact Memory:  (TBA) Awareness: Appears intact Problem Solving:  (TBA) Safety/Judgment: Impaired    Extremity Assessment (includes Sensation/Coordination)  Upper Extremity Assessment: RUE deficits/detail RUE Deficits / Details: grossly 3-/5 shoulder elevation and retraction, 2/5 shoulder flexion and elbow flexion, 0/5 forearm/hand RUE Sensation: decreased proprioception, decreased light touch RUE Coordination: decreased gross motor, decreased fine motor  Lower Extremity Assessment: RLE deficits/detail RLE Deficits / Details: Mildly decreased strength compared to L. MMT difficult due to pt slow to follow commands.    ADLs  Overall ADL's : Needs  assistance/impaired Grooming: Minimal assistance, Sitting Lower Body Bathing: Minimal assistance, Sit to/from stand Upper Body Dressing : Minimal assistance, Sitting Lower Body Dressing: Moderate assistance, Sit to/from stand Toilet Transfer: Minimal assistance, Ambulation Toilet Transfer Details (indicate cue type and reason): simulated at [place] and Hygiene: Moderate assistance, Sit to/from stand Toileting - Clothing Manipulation Details (indicate cue type and reason): for clothing mgmt on R side, min assist dynamically when completing hygiene due to purewick leaking Functional mobility during ADLs: Minimal assistance, Cueing for safety General ADL Comments: pt limited by R UE hemiparesis, cognition, balance    Mobility  Overal bed mobility: Needs Assistance Bed Mobility: Supine to Sit, Sit to Supine Supine to sit: Min assist Sit to supine: Min assist General bed mobility comments: Pt attempting to manage her RUE, however unable to completely keep in appropriate position. Assist provided for full RUE management,  as well as for trunk support as pt scooted out to EOB.    Transfers  Overall transfer level: Needs assistance Equipment used: None Transfers: Sit to/from Stand Sit to Stand: Min assist General transfer comment: to power up and steady    Ambulation / Gait / Stairs / Emergency planning/management officer  Ambulation/Gait Ambulation/Gait assistance: Herbalist (Feet): 200 Feet Assistive device: 1 person hand held assist Gait Pattern/deviations: Step-through pattern, Decreased stride length, Antalgic, Drifts right/left, Narrow base of support General Gait Details: HHA for balance support and safety. Therapist guiding pt in hall by [facility] and gait belt due to drifting. Grossly unsteady, requiring min assist throughout. Gait velocity: Decreased Gait velocity interpretation: <1.8 ft/sec, indicate of risk for recurrent falls    Posture / Balance  Balance Overall balance assessment: Needs assistance Sitting-balance support: No upper extremity supported, Feet supported Sitting balance-Leahy Scale: Fair Standing balance support: During functional activity, No upper extremity supported Standing balance-Leahy Scale: Poor Standing balance comment: relies on external support High Level Balance Comments: Pt tolerated head turns (horizontal and vertical) while ambulating fairly well with only minimal LOB    Special needs/care consideration Hemodialysis T-Th-Sat schedule   Previous Home Environment (from acute therapy documentation) Living Arrangements: Non-relatives/Friends Available Help at Discharge: Friend(s) Type of Home: Apartment Home Layout: One level Home Access: Other (comment) (chair lift to her apartment?) Bathroom Shower/Tub: Walk-in shower, Sponge bathes at baseline Biochemist, clinical: [place]: No Additional Comments: difficult to understand at times- I think this is correct  Discharge Living Setting Plans for Discharge Living Setting: House Type of Home at Discharge: Apartment Discharge Home Layout: One level Discharge Bathroom Shower/Tub: Walk-in shower Discharge Bathroom Toilet: Standard Discharge Bathroom Accessibility: No Does the patient have any problems obtaining your medications?: No  Social/Family/Support Systems Patient Roles: Other (Comment) Contact Information: [phone] Anticipated Caregiver: [name] (son) Anticipated Caregiver's Contact Information: [phone] Ability/Limitations of Caregiver: Son and sisters to rotate Caregiver Availability: 24/7 Discharge Plan Discussed with Primary Caregiver: No Is Caregiver In Agreement with Plan?: No Does Caregiver/Family have Issues with Lodging/Transportation while Pt is in Rehab?: No  Goals Patient/Family Goal for Rehab: PT/OT Mod I, SLP Min A Expected length of stay: 7-10 days Pt/Family Agrees to Admission and willing to  participate: Yes Program Orientation Provided & Reviewed with Pt/Caregiver Including Roles  & Responsibilities: No  Decrease burden of Care through IP rehab admission: none   Possible need for SNF placement upon discharge: not anticipated   Patient Condition: I have reviewed medical records from [facility], spoken with CM, and patient. I met with patient at the bedside for inpatient rehabilitation assessment.  Patient will benefit from ongoing PT, OT, and SLP, can actively participate in 3 hours of therapy a day 5 days of the week, and can make measurable gains during the admission.  Patient will also benefit from the coordinated team approach during an Inpatient Acute Rehabilitation admission.  The patient will receive intensive therapy as well as Rehabilitation physician, nursing, social worker, and care management interventions.  Due to safety, skin/wound care, disease management, medication administration, pain management, and patient education the patient requires 24 hour a day rehabilitation nursing.  The patient is currently min A with mobility and basic ADLs.  Discharge setting and therapy post discharge at home with home health is anticipated.  Patient has agreed to participate in the Acute Inpatient Rehabilitation Program and will admit today.  Preadmission Screen Completed By:  Retta Diones, 02/26/2022 1:37 PM  ______________________________________________________________________   Discussed status with Dr. Naaman Plummer on 02/26/22 at 0945 and received approval for admission today.  Admission Coordinator:  Retta Diones, RN, time 1338/Date 02/26/22   Assessment/Plan: Diagnosis: Does the need for close, 24 hr/day Medical supervision in concert with the patient's rehab needs make it unreasonable for this patient to be served in a less intensive setting? Yes Co-Morbidities requiring supervision/potential complications: ESRD on HD, dCHF, HTN, PAF on Eliquis; dysphagia;  dysarthria; R hemiparesis Due to bladder management, bowel management, safety, skin/wound care, disease management, medication administration, and patient education, does the patient require 24 hr/day rehab nursing? Yes Does the patient require coordinated care of a physician, rehab nurse, PT, OT, and SLP to address physical and functional deficits in the context of the above medical diagnosis(es)? Yes Addressing deficits in the following areas: balance, endurance, locomotion, strength, transferring, bowel/bladder control, bathing, dressing, feeding, grooming, toileting, cognition, language, and swallowing Can the patient actively participate in an intensive therapy program of at least 3 hrs of therapy 5 days a week? Yes The potential for patient to make measurable gains while on inpatient rehab is good Anticipated functional outcomes upon discharge from inpatient rehab: modified independent PT, modified independent OT, modified independent and supervision SLP Estimated rehab length of stay to reach the above functional goals is: 7-10 days Anticipated discharge destination: Home 10. Overall Rehab/Functional Prognosis: good   MD Signature:

## 2022-02-25 NOTE — Progress Notes (Signed)
Speech Language Pathology Treatment:    Patient Details Name: Monique Henderson MRN: [id number] DOB: January 29, 1951 Today's Date: 02/25/2022 Time:  -     Pt's MBS is scheduled for today at 1300.     Houston Siren  02/25/2022, 10:31 AM

## 2022-02-25 NOTE — Plan of Care (Signed)

## 2022-02-26 ENCOUNTER — Other Ambulatory Visit: Payer: Self-pay

## 2022-02-26 ENCOUNTER — Inpatient Hospital Stay (HOSPITAL_COMMUNITY)
Admission: RE | Admit: 2022-02-26 | Discharge: 2022-03-08 | DRG: 056 | Disposition: A | Discharge status: Home / Self Care | Payer: Medicare Other | Source: Intra-hospital | Attending: Physical Medicine and Rehabilitation | Admitting: Physical Medicine and Rehabilitation

## 2022-02-26 ENCOUNTER — Encounter (HOSPITAL_COMMUNITY): Payer: Self-pay | Admitting: Physical Medicine and Rehabilitation

## 2022-02-26 DIAGNOSIS — I161 Hypertensive emergency: Secondary | ICD-10-CM | POA: Diagnosis present

## 2022-02-26 DIAGNOSIS — Z7901 Long term (current) use of anticoagulants: Secondary | ICD-10-CM | POA: Diagnosis not present

## 2022-02-26 DIAGNOSIS — Z8249 Family history of ischemic heart disease and other diseases of the circulatory system: Secondary | ICD-10-CM

## 2022-02-26 DIAGNOSIS — I120 Hypertensive chronic kidney disease with stage 5 chronic kidney disease or end stage renal disease: Secondary | ICD-10-CM | POA: Diagnosis present

## 2022-02-26 DIAGNOSIS — Z803 Family history of malignant neoplasm of breast: Secondary | ICD-10-CM | POA: Diagnosis not present

## 2022-02-26 DIAGNOSIS — I10 Essential (primary) hypertension: Secondary | ICD-10-CM | POA: Diagnosis not present

## 2022-02-26 DIAGNOSIS — E7800 Pure hypercholesterolemia, unspecified: Secondary | ICD-10-CM | POA: Diagnosis present

## 2022-02-26 DIAGNOSIS — Z981 Arthrodesis status: Secondary | ICD-10-CM

## 2022-02-26 DIAGNOSIS — Z91013 Allergy to seafood: Secondary | ICD-10-CM

## 2022-02-26 DIAGNOSIS — R29810 Facial weakness: Secondary | ICD-10-CM | POA: Diagnosis present

## 2022-02-26 DIAGNOSIS — Z880 Allergy status to penicillin: Secondary | ICD-10-CM | POA: Diagnosis not present

## 2022-02-26 DIAGNOSIS — Z992 Dependence on renal dialysis: Secondary | ICD-10-CM

## 2022-02-26 DIAGNOSIS — I69351 Hemiplegia and hemiparesis following cerebral infarction affecting right dominant side: Secondary | ICD-10-CM | POA: Diagnosis present

## 2022-02-26 DIAGNOSIS — I639 Cerebral infarction, unspecified: Secondary | ICD-10-CM | POA: Diagnosis present

## 2022-02-26 DIAGNOSIS — E785 Hyperlipidemia, unspecified: Secondary | ICD-10-CM | POA: Diagnosis present

## 2022-02-26 DIAGNOSIS — F17210 Nicotine dependence, cigarettes, uncomplicated: Secondary | ICD-10-CM | POA: Diagnosis present

## 2022-02-26 DIAGNOSIS — R471 Dysarthria and anarthria: Secondary | ICD-10-CM | POA: Diagnosis present

## 2022-02-26 DIAGNOSIS — Z9012 Acquired absence of left breast and nipple: Secondary | ICD-10-CM | POA: Diagnosis not present

## 2022-02-26 DIAGNOSIS — Z886 Allergy status to analgesic agent status: Secondary | ICD-10-CM | POA: Diagnosis not present

## 2022-02-26 DIAGNOSIS — D649 Anemia, unspecified: Secondary | ICD-10-CM | POA: Diagnosis present

## 2022-02-26 DIAGNOSIS — K219 Gastro-esophageal reflux disease without esophagitis: Secondary | ICD-10-CM | POA: Diagnosis present

## 2022-02-26 DIAGNOSIS — E875 Hyperkalemia: Secondary | ICD-10-CM | POA: Diagnosis not present

## 2022-02-26 DIAGNOSIS — J449 Chronic obstructive pulmonary disease, unspecified: Secondary | ICD-10-CM | POA: Diagnosis present

## 2022-02-26 DIAGNOSIS — R000 Tachycardia, unspecified: Secondary | ICD-10-CM | POA: Diagnosis not present

## 2022-02-26 DIAGNOSIS — N186 End stage renal disease: Secondary | ICD-10-CM | POA: Diagnosis present

## 2022-02-26 DIAGNOSIS — I480 Paroxysmal atrial fibrillation: Secondary | ICD-10-CM | POA: Diagnosis present

## 2022-02-26 DIAGNOSIS — R079 Chest pain, unspecified: Secondary | ICD-10-CM | POA: Diagnosis not present

## 2022-02-26 DIAGNOSIS — Z853 Personal history of malignant neoplasm of breast: Secondary | ICD-10-CM

## 2022-02-26 DIAGNOSIS — R0789 Other chest pain: Secondary | ICD-10-CM | POA: Diagnosis not present

## 2022-02-26 DIAGNOSIS — M898X9 Other specified disorders of bone, unspecified site: Secondary | ICD-10-CM | POA: Diagnosis present

## 2022-02-26 DIAGNOSIS — Z79899 Other long term (current) drug therapy: Secondary | ICD-10-CM | POA: Diagnosis not present

## 2022-02-26 HISTORY — DX: Cerebral infarction, unspecified: I63.9

## 2022-02-26 LAB — RENAL FUNCTION PANEL
Albumin: 4 g/dL (ref 3.5–5.0)
Anion gap: 11 (ref 5–15)
BUN: 15 mg/dL (ref 8–23)
CO2: 24 mmol/L (ref 22–32)
Calcium: 9.4 mg/dL (ref 8.9–10.3)
Chloride: 96 mmol/L — ABNORMAL LOW (ref 98–111)
Creatinine, Ser: 4.56 mg/dL — ABNORMAL HIGH (ref 0.44–1.00)
GFR, Estimated: 10 mL/min — ABNORMAL LOW (ref >60)
Glucose, Bld: 156 mg/dL — ABNORMAL HIGH (ref 70–99)
Phosphorus: 4.1 mg/dL (ref 2.5–4.6)
Potassium: 3.9 mmol/L (ref 3.5–5.1)
Sodium: 131 mmol/L — ABNORMAL LOW (ref 135–145)

## 2022-02-26 LAB — TROPONIN I (HIGH SENSITIVITY)
Troponin I (High Sensitivity): 152 ng/L (ref <18)
Troponin I (High Sensitivity): 162 ng/L (ref <18)

## 2022-02-26 LAB — CBC
HCT: 38 % (ref 36.0–46.0)
Hemoglobin: 12 g/dL (ref 12.0–15.0)
MCH: 30.8 pg (ref 26.0–34.0)
MCHC: 31.6 g/dL (ref 30.0–36.0)
MCV: 97.7 fL (ref 80.0–100.0)
Platelets: 261 10*3/uL (ref 150–400)
RBC: 3.89 MIL/uL (ref 3.87–5.11)
RDW: 15.8 % — ABNORMAL HIGH (ref 11.5–15.5)
WBC: 3.3 10*3/uL — ABNORMAL LOW (ref 4.0–10.5)
nRBC: 0 % (ref 0.0–0.2)

## 2022-02-26 MED ORDER — ALBUTEROL SULFATE (2.5 MG/3ML) 0.083% IN NEBU: 2.5000 mg | INHALATION_SOLUTION | Freq: Four times a day (QID) | RESPIRATORY_TRACT | Status: DC | PRN

## 2022-02-26 MED ORDER — ROSUVASTATIN CALCIUM 5 MG PO TABS
10.0000 mg | ORAL_TABLET | Freq: Every day | ORAL | Status: DC
  Administered 2022-02-27 – 2022-03-08 (×10): 10 mg via ORAL
  Filled 2022-02-26 (×10): qty 2

## 2022-02-26 MED ORDER — PROCHLORPERAZINE MALEATE 5 MG PO TABS: 5.0000 mg | ORAL_TABLET | Freq: Four times a day (QID) | ORAL | Status: DC | PRN

## 2022-02-26 MED ORDER — GUAIFENESIN-DM 100-10 MG/5ML PO SYRP
5.0000 mL | ORAL_SOLUTION | Freq: Four times a day (QID) | ORAL | Status: DC | PRN
  Administered 2022-03-05 – 2022-03-06 (×2): 10 mL via ORAL
  Filled 2022-02-26 (×2): qty 10

## 2022-02-26 MED ORDER — AMLODIPINE BESYLATE 10 MG PO TABS
10.0000 mg | ORAL_TABLET | Freq: Every day | ORAL | Status: DC
  Administered 2022-02-27 – 2022-03-08 (×9): 10 mg via ORAL
  Filled 2022-02-26 (×9): qty 1

## 2022-02-26 MED ORDER — CARVEDILOL 6.25 MG PO TABS
6.2500 mg | ORAL_TABLET | Freq: Two times a day (BID) | ORAL | Status: DC
  Administered 2022-02-27 – 2022-03-08 (×19): 6.25 mg via ORAL
  Filled 2022-02-26 (×19): qty 1

## 2022-02-26 MED ORDER — DOXERCALCIFEROL 4 MCG/2ML IV SOLN
4.0000 ug | INTRAVENOUS | Status: DC
  Filled 2022-02-26: qty 2

## 2022-02-26 MED ORDER — BISACODYL 10 MG RE SUPP: 10.0000 mg | Freq: Every day | RECTAL | Status: DC | PRN

## 2022-02-26 MED ORDER — ISOSORBIDE MONONITRATE ER 30 MG PO TB24
30.0000 mg | ORAL_TABLET | Freq: Every day | ORAL | Status: DC
  Administered 2022-02-27 – 2022-03-08 (×9): 30 mg via ORAL
  Filled 2022-02-26 (×9): qty 1

## 2022-02-26 MED ORDER — CLONIDINE 0.1 MG/24HR TD PTWK: 0.1000 mg | MEDICATED_PATCH | TRANSDERMAL | 0 refills | Status: DC

## 2022-02-26 MED ORDER — TRAZODONE HCL 50 MG PO TABS
25.0000 mg | ORAL_TABLET | Freq: Every evening | ORAL | Status: DC | PRN
  Administered 2022-02-26 – 2022-03-05 (×5): 50 mg via ORAL
  Filled 2022-02-26 (×5): qty 1

## 2022-02-26 MED ORDER — POLYETHYLENE GLYCOL 3350 17 G PO PACK: 17.0000 g | PACK | Freq: Every day | ORAL | Status: DC | PRN

## 2022-02-26 MED ORDER — CLONIDINE HCL 0.1 MG/24HR TD PTWK
0.1000 mg | MEDICATED_PATCH | TRANSDERMAL | Status: DC
  Administered 2022-02-28 – 2022-03-07 (×2): 0.1 mg via TRANSDERMAL
  Filled 2022-02-26 (×2): qty 1

## 2022-02-26 MED ORDER — MILK AND MOLASSES ENEMA: 1.0000 | Freq: Every day | RECTAL | Status: DC | PRN

## 2022-02-26 MED ORDER — CALCIUM CARBONATE ANTACID 500 MG PO CHEW: 1.0000 | CHEWABLE_TABLET | ORAL | Status: DC | PRN

## 2022-02-26 MED ORDER — APIXABAN 2.5 MG PO TABS
2.5000 mg | ORAL_TABLET | Freq: Two times a day (BID) | ORAL | Status: DC
  Administered 2022-02-26 – 2022-03-08 (×20): 2.5 mg via ORAL
  Filled 2022-02-26 (×20): qty 1

## 2022-02-26 MED ORDER — CHLORHEXIDINE GLUCONATE CLOTH 2 % EX PADS
6.0000 | MEDICATED_PAD | Freq: Every day | CUTANEOUS | Status: DC
  Administered 2022-02-27 – 2022-03-02 (×4): 6 via TOPICAL

## 2022-02-26 MED ORDER — DIPHENHYDRAMINE HCL 12.5 MG/5ML PO ELIX: 12.5000 mg | ORAL_SOLUTION | Freq: Four times a day (QID) | ORAL | Status: DC | PRN

## 2022-02-26 MED ORDER — HEPARIN SODIUM (PORCINE) 1000 UNIT/ML IJ SOLN
INTRAMUSCULAR | Status: AC
  Filled 2022-02-26: qty 4

## 2022-02-26 MED ORDER — SIMETHICONE 80 MG PO CHEW: 80.0000 mg | CHEWABLE_TABLET | Freq: Four times a day (QID) | ORAL | Status: DC | PRN

## 2022-02-26 MED ORDER — KETOROLAC TROMETHAMINE 15 MG/ML IJ SOLN
15.0000 mg | Freq: Once | INTRAMUSCULAR | Status: AC
  Administered 2022-02-26: 15 mg via INTRAVENOUS
  Filled 2022-02-26: qty 1

## 2022-02-26 MED ORDER — PROCHLORPERAZINE 25 MG RE SUPP: 12.5000 mg | Freq: Four times a day (QID) | RECTAL | Status: DC | PRN

## 2022-02-26 MED ORDER — PROCHLORPERAZINE EDISYLATE 10 MG/2ML IJ SOLN: 5.0000 mg | Freq: Four times a day (QID) | INTRAMUSCULAR | Status: DC | PRN

## 2022-02-26 NOTE — Significant Event (Signed)
Rapid Response Event Note   Reason for Call :  12 lead reading STEMI  Initial Focused Assessment:  Patient in bed, finishing lunch after HD, slight grimace on face with c/o 10/10 chest pain. Skin is slightly tenting.   EKG reviewed--STE not noted in any leads.  VS: BP 122/77 (91), HR 113, RR 18, O2 100 RA  Interventions:  EKG  Plan of Care:  PRN pain medicine Trend troponin  Event Summary:  MD NotifiedLoann Quill MD Call Time: [place] Time: 7846 End Time: 1615  Newman Nickels, RN

## 2022-02-26 NOTE — Discharge Summary (Addendum)
Physician Discharge Summary  PAXTON KANAAN BSJ:[id number] DOB: Feb 13, 1951 DOA: 02/23/2022  PCP: Pcp, No  Admit date: 02/23/2022 Discharge date: 02/26/2022 30 Day Unplanned Readmission Risk Score    Flowsheet Row ED to Hosp-Admission (Current) from 02/23/2022 in [facility]  30 Day Unplanned Readmission Risk Score (%) 47.85 Filed at 02/26/2022 1200       This score is the patient's risk of an unplanned readmission within 30 days of being discharged (0 -100%). The score is based on dignosis, age, lab data, medications, orders, and past utilization.   Low:  0-14.9   Medium: 15-21.9   High: 22-29.9   Extreme: 30 and above          Admitted From: Home Disposition: cir  Recommendations for Outpatient Follow-up:  Follow up with PCP in 1-2 weeks Please obtain BMP/CBC in one week Follow up with neurology in 2 to 4 weeks Please follow up with your PCP on the following pending results: Unresulted Labs (From admission, onward)     Start     Ordered   02/26/22 1500  Renal function panel  Once,   R        02/26/22 1500   02/23/22 1713  Rapid urine drug screen (hospital performed)  ONCE - STAT,   STAT        02/23/22 1712   02/23/22 1402  CBC with Differential  Once,   STAT        02/23/22 1403   Signed and Held  CBC  Once,   R       Question:  Specimen collection method  Answer:  Lab=Lab collect   Signed and [place]: None Equipment/Devices: None  Discharge Condition: Stable CODE STATUS: DNR Diet recommendation: Dysphagia 2 diet  Subjective: Seen and examined.  No new complaint.  Continues to have dysarthria.  HPI: ROCKLYN MAYBERRY is a 71 y.o. female with medical history significant of ESRD on HD TTS, PAF on Eliquis, aortic dissection status postrepair, HFrEF, refractory HTN, presented with strokelike symptoms.   Symptoms started yesterday evening around 9:00, patient suddenly developed right-sided facial droop and right arm  weakness.  She chose to not come in the hospital and went to bed.  She also noticed that when she tried to eat or drink something , she started to cough right after.  As result, she did not eat or drink anything since yesterday evening.  This morning, patient woke up with persistent right-sided facial droop and right arm weakness.  She also developed slurred speech when talking to her sister over the phone.  She claimed that she is been taking her blood pressure medications and Eliquis every day, last time she took Eliquis was yesterday evening ED Course: Blood pressure significant elevated SBP> 200, DBP> 130, CT head negative for acute findings.  Blood work K5.9, no significant ST changes  Brief/Interim Summary: Details below.  Right upper extremity paresis and right-sided facial droop and dysarthria/acute ischemic stroke: Acute to subacute cortical infarct in the left posterior frontal and anterior parietal lobe, including the central sulcus. Additional focal acute to subacute infarct involving the left caudate head. Additional smaller acute to subacute infarcts involving the left caudate tail, occipital lobe, and frontal lobe.  Neurology on board, per them, likely cardioembolic.  Recommendation to continue Eliquis.  PT OT recommends CIR.  Events noted when late night on 02/24/2022, patient was noted to  have increasing difficulty with swallowing despite evaluation by the SLP earlier.  Night hospitalist discussed with the neurology, repeat MRI was done which essentially did not show anything new.  Patient was placed NPO.  Seen by SLP again, MBS completed, they placed her on dysphagia 2 diet.  Bed has been arranged for CIR.  She is being discharged stable condition.   HTN emergency: Blood pressure now better.  Resume previous home medications.   Hyperkalemia: Resolved.   ESRD on HD Receiving dialysis today.  Nephrology on board.   Chronic HFrEF: Stable.   PAF: Continue Eliquis and Coreg.    History of cocaine abuse -Denied recent cocaine use.  UDS ordered but sample not collected yet.   Chest pain: Overnight on 02/24/2022, she complained of chest pain.  Troponins were checked which were elevated but flat, these are chronically elevated due to ESRD status.  No ACS.  No more chest pain.  Discharge plan was discussed with patient and/or family member and they verbalized understanding and agreed with it.  Discharge Diagnoses:  Principal Problem:   Stroke [facility]) Active Problems:   Hyperkalemia   AF (paroxysmal atrial fibrillation) ([place])   ESRD on dialysis [facility])   DNR (do not resuscitate)   Stroke (cerebrum) ([place])    Discharge Instructions   Allergies as of 02/26/2022       Reactions   Shrimp [shellfish Allergy] Shortness Of Breath   Bactroban [mupirocin] Other (See Comments)   "Sores in nose"   Tylenol [acetaminophen] Itching   Zestril [lisinopril] Cough        Medication List     STOP taking these medications    cloNIDine 0.2 mg/24hr patch Commonly known as: CATAPRES - Dosed in mg/24 hr Replaced by: cloNIDine 0.1 mg/24hr patch   hydrALAZINE 50 MG tablet Commonly known as: APRESOLINE   losartan 100 MG tablet Commonly known as: COZAAR       TAKE these medications    albuterol 108 (90 Base) MCG/ACT inhaler Commonly known as: VENTOLIN HFA Inhale 2 puffs into the lungs every 6 (six) hours as needed for wheezing or shortness of breath.   amLODipine 10 MG tablet Commonly known as: NORVASC Take 1 tablet (10 mg total) by mouth daily.   apixaban 2.5 MG Tabs tablet Commonly known as: ELIQUIS Take 1 tablet (2.5 mg total) by mouth 2 (two) times daily.   carvedilol 6.25 MG tablet Commonly known as: COREG Take 1 tablet (6.25 mg total) by mouth 2 (two) times daily with a meal.   cloNIDine 0.1 mg/24hr patch Commonly known as: CATAPRES - Dosed in mg/24 hr Place 1 patch (0.1 mg total) onto the skin every Saturday. Start taking on: February 28, 2022 Replaces: cloNIDine 0.2 mg/24hr patch   isosorbide mononitrate 30 MG 24 hr tablet Commonly known as: IMDUR Take 2 tablets (60 mg total) by mouth daily. What changed: how much to take   mirtazapine 7.5 MG tablet Commonly known as: REMERON Take 1 tablet (7.5 mg total) by mouth at bedtime. What changed:  when to take this reasons to take this   rosuvastatin 10 MG tablet Commonly known as: CRESTOR Take 10 mg by mouth daily.   sevelamer carbonate 2.4 g Pack Commonly known as: RENVELA 2.4 g with breakfast, with lunch, and with evening meal.   Torsemide 40 MG Tabs Take 40 mg by mouth 2 (two) times daily.   VITAMIN D-3 PO Take 1 capsule by mouth daily.        Follow-up Information  PCP Follow up in 1 week(s).                 Allergies  Allergen Reactions   Shrimp [Shellfish Allergy] Shortness Of Breath   Bactroban [Mupirocin] Other (See Comments)    "Sores in nose"   Tylenol [Acetaminophen] Itching   Zestril [Lisinopril] Cough    Consultations: Neurology and nephrology   Procedures/Studies: ECHOCARDIOGRAM COMPLETE  Result Date: 02/25/2022    ECHOCARDIOGRAM REPORT   Patient Name:   SIMAR POTHIER Date of Exam: 02/25/2022 Medical Rec #:  [id number]          Height:       64.0 in Accession #:    [id number]         Weight:       100.7 lb Date of Birth:  February 11, 1951           BSA:          1.462 m Patient Age:    71 years           BP:           157/97 mmHg Patient Gender: F                  HR:           94 bpm. Exam Location:  Inpatient Procedure: 2D Echo, Cardiac Doppler and Color Doppler Indications:    Stroke  History:        Patient has prior history of Echocardiogram examinations, most                 recent 06/19/2021. Risk Factors:Hypertension.  Sonographer:    Jefferey Pica Referring Phys: [id number] [place]  1. Left ventricular ejection fraction, by estimation, is 30 to 35%. Left ventricular ejection fraction by PLAX is 30 %. The left  ventricle has moderately decreased function. The left ventricle demonstrates global hypokinesis. There is severe concentric left ventricular hypertrophy. Left ventricular diastolic parameters are consistent with Grade I diastolic dysfunction (impaired relaxation).  2. Right ventricular systolic function is normal. The right ventricular size is normal. There is normal pulmonary artery systolic pressure.  3. Left atrial size was severely dilated.  4. The mitral valve is normal in structure. Trivial mitral valve regurgitation. No evidence of mitral stenosis.  5. The aortic valve is tricuspid. Aortic valve regurgitation is not visualized. No aortic stenosis is present.  6. The inferior vena cava is normal in size with greater than 50% respiratory variability, suggesting right atrial pressure of 3 mmHg. FINDINGS  Left Ventricle: Left ventricular ejection fraction, by estimation, is 30 to 35%. Left ventricular ejection fraction by PLAX is 30 %. The left ventricle has moderately decreased function. The left ventricle demonstrates global hypokinesis. The left ventricular internal cavity size was normal in size. There is severe concentric left ventricular hypertrophy. Left ventricular diastolic parameters are consistent with Grade I diastolic dysfunction (impaired relaxation). Right Ventricle: The right ventricular size is normal. No increase in right ventricular wall thickness. Right ventricular systolic function is normal. There is normal pulmonary artery systolic pressure. The tricuspid regurgitant velocity is 2.33 m/s, and  with an assumed right atrial pressure of 3 mmHg, the estimated right ventricular systolic pressure is 30.8 mmHg. Left Atrium: Left atrial size was severely dilated. Right Atrium: Right atrial size was normal in size. Pericardium: There is no evidence of pericardial effusion. Mitral Valve: The mitral valve is normal in structure. Trivial mitral valve regurgitation. No evidence of  mitral valve stenosis.  Tricuspid Valve: The tricuspid valve is normal in structure. Tricuspid valve regurgitation is mild . No evidence of tricuspid stenosis. Aortic Valve: The aortic valve is tricuspid. Aortic valve regurgitation is not visualized. No aortic stenosis is present. Aortic valve peak gradient measures 7.2 mmHg. Pulmonic Valve: The pulmonic valve was normal in structure. Pulmonic valve regurgitation is not visualized. No evidence of pulmonic stenosis. Aorta: The aortic root is normal in size and structure. Venous: The inferior vena cava is normal in size with greater than 50% respiratory variability, suggesting right atrial pressure of 3 mmHg. IAS/Shunts: No atrial level shunt detected by color flow Doppler.  LEFT VENTRICLE PLAX 2D LV EF:         Left ventricular ejection fraction by PLAX is 30 %. LVIDd:         3.60 cm LVIDs:         3.10 cm LV PW:         2.00 cm LV IVS:        1.50 cm LVOT diam:     1.90 cm LV SV:         41 LV SV Index:   28 LVOT Area:     2.84 cm  RIGHT VENTRICLE            IVC RV Basal diam:  3.10 cm    IVC diam: 1.10 cm RV S prime:     6.02 cm/s LEFT ATRIUM             Index        RIGHT ATRIUM           Index LA diam:        3.30 cm 2.26 cm/m   RA Area:     14.30 cm LA Vol (A2C):   65.3 ml 44.67 ml/m  RA Volume:   38.30 ml  26.20 ml/m LA Vol (A4C):   37.2 ml 25.45 ml/m LA Biplane Vol: 51.3 ml 35.09 ml/m  AORTIC VALVE                 PULMONIC VALVE AV Area (Vmax): 2.13 cm     PV Vmax:       0.73 m/s AV Vmax:        134.50 cm/s  PV Peak grad:  2.1 mmHg AV Peak Grad:   7.2 mmHg LVOT Vmax:      101.00 cm/s LVOT Vmean:     59.200 cm/s LVOT VTI:       0.144 m  AORTA Ao Root diam: 2.80 cm Ao Asc diam:  2.90 cm MITRAL VALVE               TRICUSPID VALVE MV Area (PHT): 5.02 cm    TR Peak grad:   21.7 mmHg MV Decel Time: 151 msec    TR Vmax:        233.00 cm/s MV E velocity: 69.40 cm/s MV A velocity: 97.30 cm/s  SHUNTS MV E/A ratio:  0.71        Systemic VTI:  0.14 m                            Systemic  Diam: 1.90 cm Skeet Latch MD Electronically signed by Skeet Latch MD Signature Date/Time: 02/25/2022/4:54:01 PM    Final    DG Swallowing Func-Speech Pathology  Result Date: 02/25/2022 Table formatting from the original result was not included. Objective Swallowing  Evaluation: Type of Study: MBS-Modified Barium Swallow Study  Patient Details Name: PAISLYN DOMENICO MRN: [id number] Date of Birth: August 20, 1950 Today's Date: 02/25/2022 Time: SLP Start Time (ACUTE ONLY): 8546 -SLP Stop Time (ACUTE ONLY): 1301 SLP Time Calculation (min) (ACUTE ONLY): 13 min Past Medical History: Past Medical History: Diagnosis Date  AF (paroxysmal atrial fibrillation) ([place]) 05/29/2019  on Coumadin  Aortic atherosclerosis ([place]) 07/05/2019  Aortic dissection ([place]) 04/04/2019  s/p repair  Bone spur 2008  Right calcaneal foot spur  Breast cancer ([place]) 2004  Ductal carcinoma in situ of the left breast; S/P left partial mastectomy 02/26/2003; S/P re-excision of cranial and lateral margins11/18/2004.radiation  Cerebral thrombosis with cerebral infarction 05/22/2019  Chronic low back pain 06/22/2016  Chronic obstructive lung disease ([place]) 01/16/2017  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 Autumn Messing; S/P re-excision of superior and inferior margins 10/27/2012.   ESRD on hemodialysis ([place]) 05/29/2019  Essential hypertension 09/16/2006  GERD 09/16/2006  Hepatitis C   treated and RNA confirmed not detectable 01/2017  Insomnia 03/14/2015  Malnutrition of moderate degree 05/19/2019  Non compliance with medical treatment 12/04/2017  Normocytic anemia   With thrombocytosis  Osteoarthritis   Right ureteral stone 2002  S/P lumbar spinal fusion 01/18/2014  S/P lumbar decompressive laminectomy, fusion, and plating for lumbar spinal stenosis on 05/27/2009 by Dr. Eustace Moore.  S/P anterolateral retroperitoneal interbody fusion L2-3 utilizing a 8 mm peek interbody cage packed with morcellized  allograft, and anterior lumbar plating L2-3 for recurrent disc herniation L2-3 with spinal stenosis on 01/18/2014 by Dr. Eustace Moore.    Tobacco use disorder 04/19/2009  Uterine fibroid   Wears dentures   top Past Surgical History: Past Surgical History: Procedure Laterality Date  ANTERIOR LAT LUMBAR FUSION N/A 01/18/2014  Procedure: ANTERIOR LATERAL LUMBAR FUSION LUMBAR TWO-THREE;  Surgeon: Eustace Moore, MD;  Location: [place] [facility];  Service: Neurosurgery;  Laterality: N/A;  ANTERIOR LUMBAR FUSION  01/18/2014  AV FISTULA PLACEMENT Left 04/20/2019  Procedure: ARTERIOVENOUS (AV) FISTULA CREATION;  Surgeon: [name], MD;  Location: [place];  Service: Vascular;  Laterality: Left;  BACK SURGERY    BREAST LUMPECTOMY Left 01/2003  BREAST LUMPECTOMY Right 2014  BREAST LUMPECTOMY WITH NEEDLE LOCALIZATION AND AXILLARY SENTINEL LYMPH NODE BX Right 10/13/2012  Procedure: BREAST LUMPECTOMY WITH NEEDLE LOCALIZATION;  Surgeon: [name], MD;  Location: [place];  Service: General;  Laterality: Right;  Right breast wire localized lumpectomy  INSERTION OF DIALYSIS CATHETER Right 04/20/2019  Procedure: INSERTION OF DIALYSIS CATHETER, right internal jugular;  Surgeon: [name], MD;  Location: [place];  Service: Vascular;  Laterality: Right;  INSERTION OF DIALYSIS CATHETER Right 09/24/2020  Procedure: INSERTION OF TUNNELED DIALYSIS CATHETER;  Surgeon: Rosetta Posner, MD;  Location: [place];  Service: Vascular;  Laterality: Right;  IR FLUORO GUIDE CV LINE RIGHT  09/22/2020  IR THORACENTESIS ASP PLEURAL SPACE W/IMG GUIDE  05/19/2019  IR US GUIDE VASC ACCESS LEFT  09/22/2020  IR US GUIDE VASC ACCESS RIGHT  09/22/2020  IR VENOCAVAGRAM SVC  09/22/2020  LAMINECTOMY  05/27/2009  Lumbar decompressive laminectomy, fusion and plating for lumbar spinal stensosis  LIGATION OF ARTERIOVENOUS  FISTULA Left 09/24/2020  Procedure: LIGATION OF LEFT ARM ARTERIOVENOUS  FISTULA;  Surgeon: Rosetta Posner, MD;   Location: [place];  Service: Vascular;  Laterality: Left;  LUMBAR LAMINECTOMY/DECOMPRESSION MICRODISCECTOMY Left 03/23/2013  Procedure: LUMBAR LAMINECTOMY/DECOMPRESSION MICRODISCECTOMY 1 LEVEL;  Surgeon: Eustace Moore, MD;  Location: [facility];  Service: Neurosurgery;  Laterality: Left;  LUMBAR LAMINECTOMY/DECOMPRESSION MICRODISCECTOMY 1 LEVEL  MASTECTOMY, PARTIAL Left 02/26/2003  ; S/P re-excision of cranial and lateral margins 04/19/2003.   RE-EXCISION OF BREAST CANCER,SUPERIOR MARGINS Right 10/27/2012  Procedure: RE-EXCISION OF BREAST CANCER,SUPERIOR and inferior MARGINS;  Surgeon: [name], MD;  Location: [place];  Service: General;  Laterality: Right;  RE-EXCISION OF BREAST LUMPECTOMY Left 04/2003  TEE WITHOUT CARDIOVERSION N/A 04/04/2019  Procedure: Transesophageal Echocardiogram ([place]);  Surgeon: Wonda Olds, MD;  Location: [place];  Service: Open Heart Surgery;  Laterality: N/A;  THORACIC AORTIC ANEURYSM REPAIR N/A 04/04/2019  Procedure: THORACIC ASCENDING ANEURYSM REPAIR (AAA)  USING 28 MM X 30 CM HEMASHIELD PLATINUM VASCULAR GRAFT;  Surgeon: Wonda Olds, MD;  Location[facility];  Service: Open Heart Surgery;  Laterality: N/A; HPI: Pt is a 71 y.o. female with medical history significant of ESRD on HD, PAF on Eliquis, aortic dissection status postrepair, HFrEF, refractory HTN, presented with strokelike right sided deficits (facial droop, slurred speech, right sided weakness). MRI brain showed acute changes to left posterior frontal, anterior parietal lobe, including the central sulcus, the left caudate head, and occipital lobe.  No data recorded  Recommendations for follow up therapy are one component of a multi-disciplinary discharge planning process, led by the attending physician.  Recommendations may be updated based on patient status, additional functional criteria and insurance authorization. Assessment / Plan / Recommendation   02/25/2022   1:06 PM Clinical Impressions Clinical Impression Pt  alert and attentive throughout study. Pt presents with minimal oropharyngeal dysphagia marked by swallow initiation delay to the pyriform sinuses, decreased bolus cohesion, and right anterior bolus loss. Pt displayed swallow initiation delay with thin liquids and pill administration (whole in puree). Right anterior loss, anterior sulcus pocketing, and decreased bolus cohesion noted with thin liquids. Hyolaryngeal excursion, pharyngeal contractions and epiglottic inversion adequate for airway protection. No airway intrusion noted throughout study. Despite noted right sided facial weakness, no buccal pocketing of bolus present. Pt edentulous, however, mastication of regular consistency presents as functional. Per chart review, RN reports pt had worsening dysphagia overnight (9/26) marked by increased coughing possibly due to lethargic state. Recommend pt start thin liquid/dys 2 (chopped/minced) diet. Full supervision to monitor alertness, ensure pt upright while eating and crush pills. ST will follow for diet appropriateness/advancement. SLP Visit Diagnosis Dysphagia, oral phase (R13.11);Dysphagia, oropharyngeal phase (R13.12) Impact on safety and function Mild aspiration risk     02/25/2022   1:06 PM Treatment Recommendations Treatment Recommendations Therapy as outlined in treatment plan below     02/25/2022   1:06 PM Prognosis Prognosis for Safe Diet Advancement Good Barriers to Reach Goals Severity of deficits   02/25/2022   1:06 PM Diet Recommendations SLP Diet Recommendations Thin liquid;Dysphagia 2 (Fine chop) solids Liquid Administration via Straw;Cup Medication Administration Crushed with puree Compensations Small sips/bites;Slow rate;Minimize environmental distractions;Monitor for anterior loss Postural Changes Seated upright at 90 degrees     02/25/2022   1:06 PM Other Recommendations Oral Care Recommendations Oral care BID Follow Up Recommendations Acute inpatient rehab (3hours/day) Assistance recommended at  discharge Intermittent Supervision/Assistance Functional Status Assessment Patient has had a recent decline in their functional status and demonstrates the ability to make significant improvements in function in a reasonable and predictable amount of time.   02/25/2022   1:06 PM Frequency and Duration  Speech Therapy Frequency (ACUTE ONLY) min 2x/week Treatment Duration 2 weeks  02/25/2022   1:06 PM Oral Phase Oral Phase Impaired Oral - Pudding Teaspoon NT Oral - Pudding Cup NT Oral - Thin Teaspoon NT Oral - Thin Cup Right anterior bolus loss;Weak lingual manipulation;Pocketing in anterior sulcus;Decreased bolus cohesion Oral - Thin Straw Right anterior bolus loss;Weak lingual manipulation;Pocketing in anterior sulcus;Decreased bolus cohesion Oral - Puree WFL Oral - Regular WFL Oral - Pill Reduced posterior propulsion;Holding of bolus    02/25/2022   1:06 PM Pharyngeal Phase Pharyngeal Phase Impaired Pharyngeal- Thin Cup Delayed swallow initiation-pyriform sinuses Pharyngeal- Thin Straw Delayed swallow initiation-vallecula Pharyngeal- Puree Delayed swallow initiation-vallecula Pharyngeal- Regular WFL Pharyngeal- Multi-consistency NT Pharyngeal- Pill [facility]    02/25/2022   1:06 PM Cervical Esophageal Phase  Cervical Esophageal Phase WFL [place] Siren 02/25/2022, 2:38 PM                     MR BRAIN WO CONTRAST  Result Date: 02/25/2022 CLINICAL DATA:  Stroke-like symptoms; right-sided facial droop and right arm weakness EXAM: MRI HEAD WITHOUT CONTRAST TECHNIQUE: Multiplanar, multiecho pulse sequences of the brain and surrounding structures were obtained without intravenous contrast. COMPARISON:  02/23/2022 MRA head FINDINGS: Brain: Redemonstrated restricted diffusion with ADC correlate in the left posterior frontal and anterior parietal lobe cortex left caudate head, left occipital lobe cortex and white matter, left caudate tail, and left frontal lobe white matter. Increased conspicuity of additional punctate  cortical infarcts in the left frontal lobe (series 5, image 78 and left temporal lobe (series 5, image 65), which in retrospect were present on the prior exam but less hyperintense. All of these foci are associated with increased T2 hyperintense signal. No acute hemorrhage, mass mass effect, or midline shift. No hydrocephalus or extra-axial collection. Redemonstrated scattered foci of susceptibility in the bilateral cerebral hemispheres and deep gray structures, likely sequela of prior infarcts and microhemorrhages. Confluent T2 hyperintense signal in the periventricular white matter, likely the sequela of severe chronic small vessel ischemic disease. Vascular: Normal arterial flow voids. Skull and upper cervical spine: Normal marrow signal. Sinuses/Orbits: No acute finding. Other: The mastoids are well aerated. Fluid in the left petrous apex. IMPRESSION: Redemonstrated acute infarcts in the left cerebral hemisphere, with a few punctate infarcts in the left frontal and temporal lobes likely present on the prior exam in retrospect but now increased in conspicuity. No definite new acute infarct. No evidence of hemorrhagic transformation, mass effect, or midline shift. Electronically Signed   By: Merilyn Baba M.D.   On: 02/25/2022 02:33   DG CHEST PORT 1 VIEW  Result Date: 02/24/2022 CLINICAL DATA:  Congestive heart failure. EXAM: PORTABLE CHEST 1 VIEW COMPARISON:  February 23, 2022. FINDINGS: Stable cardiomegaly. Right internal jugular Port-A-Cath is unchanged in position. Lungs are clear. Bony thorax is unremarkable. IMPRESSION: No active disease. Electronically Signed   By: Marijo Conception M.D.   On: 02/24/2022 08:59   MR BRAIN WO CONTRAST  Result Date: 02/23/2022 CLINICAL DATA:  Stroke-like symptoms; right upper extremity weakness, right facial droop, dysarthria EXAM: MRI HEAD WITHOUT CONTRAST MRA HEAD WITHOUT CONTRAST MRA NECK WITHOUT CONTRAST TECHNIQUE: Multiplanar, multi-echo pulse sequences of the  brain and surrounding structures were acquired without intravenous contrast. Angiographic images of the Circle of Willis were acquired using MRA technique without intravenous contrast. Angiographic images of the neck were acquired using MRA technique withoutintravenous contrast. Carotid stenosis measurements (when applicable) are obtained utilizing NASCET criteria, using the distal internal carotid diameter as the denominator. COMPARISON:  MRI/MRA head 05/21/2019, correlation is  made with CT head 02/23/2022 FINDINGS: MRI HEAD FINDINGS Brain: Cortical restricted diffusion with ADC correlate in the left posterior MCA territory, involving the left posterior frontal and anterior parietal lobes (series 5, image 87 and series 7, image 51), including the central sulcus. Focal restricted diffusion with ADC correlate in the left caudate head (series 5, image 78). Smaller foci of restricted diffusion with ADC correlates in the left occipital lobe cortex and white matter (series 5, images 67, 68, 78), left caudate taill(series 5, image 78), and left frontal lobe (series 5, image 86). These areas are associated with increased T2 hyperintense signal. No acute hemorrhage, mass, mass effect, or midline shift. Scattered foci susceptibility in the bilateral cerebral hemispheres and deep gray structures, likely sequela of prior infarcts and microhemorrhages. Confluent T2 hyperintense signal in the periventricular white matter, likely the sequela of severe chronic small vessel ischemic disease. No hydrocephalus or extra-axial collection. Vascular: Please see MRA findings below. Skull and upper cervical spine: Normal marrow signal. Sinuses/Orbits: No acute finding. Other: The mastoids are well aerated. MRA HEAD FINDINGS Anterior circulation: Both internal carotid arteries are patent to the termini, without significant stenosis. A1 segments patent. Normal anterior communicating artery. Anterior cerebral arteries are patent to their  distal aspects. No M1 stenosis or occlusion. Normal MCA bifurcations. Distal MCA branches perfused and symmetric. Posterior circulation: Evaluation of the proximal vertebral arteries is limited by motion artifact. Vertebral arteries otherwise patent to the vertebrobasilar junction without stenosis. Basilar patent to its distal aspect. Superior cerebellar arteries patent bilaterally. Patent P1 segments. PCAs perfused to their distal aspects without stenosis. The bilateral posterior communicating arteries are not visualized. Anatomic variants: None significant MRA NECK FINDINGS Evaluation is somewhat limited by motion artifact. No evidence of dissection, occlusion, or hemodynamically significant stenosis (greater than 50%). Suspect beading of the right-greater-than-left distal ICAs bilaterally (series 6, images 181-224 on the right and 216-228 on the left). IMPRESSION: 1. 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 Additional focal acute to subacute infarct involving the left caudate head 2. Additional smaller acute to subacute infarcts involving the left caudate tail, occipital lobe, and frontal lobe. 3. No definite hemodynamically significant stenosis in the neck. Possible beading of the right-greater-than-left distal ICAs, concerning for fibromuscular dysplasia. This could be better evaluated with a CTA of the neck if clinically indicated. 4. No intracranial large vessel occlusion or significant stenosis. These results were called by telephone at the time of interpretation on 02/23/2022 at 9:24 pm to provider Dr. Marlyce Huge, who verbally acknowledged these results. Electronically Signed   By: Merilyn Baba M.D.   On: 02/23/2022 21:24   MR ANGIO HEAD WO CONTRAST  Result Date: 02/23/2022 CLINICAL DATA:  Stroke-like symptoms; right upper extremity weakness, right facial droop, dysarthria EXAM: MRI HEAD WITHOUT CONTRAST MRA HEAD WITHOUT  CONTRAST MRA NECK WITHOUT CONTRAST TECHNIQUE: Multiplanar, multi-echo pulse sequences of the brain and surrounding structures were acquired without intravenous contrast. Angiographic images of the Circle of Willis were acquired using MRA technique without intravenous contrast. Angiographic images of the neck were acquired using MRA technique withoutintravenous contrast. Carotid stenosis measurements (when applicable) are obtained utilizing NASCET criteria, using the distal internal carotid diameter as the denominator. COMPARISON:  MRI/MRA head 05/21/2019, correlation is made with CT head 02/23/2022 FINDINGS: MRI HEAD FINDINGS Brain: Cortical restricted diffusion with ADC correlate in the left posterior MCA territory, involving the left posterior frontal and anterior parietal lobes (series 5, image 87  and series 7, image 51), including the central sulcus. Focal restricted diffusion with ADC correlate in the left caudate head (series 5, image 78). Smaller foci of restricted diffusion with ADC correlates in the left occipital lobe cortex and white matter (series 5, images 67, 68, 78), left caudate taill(series 5, image 78), and left frontal lobe (series 5, image 86). These areas are associated with increased T2 hyperintense signal. No acute hemorrhage, mass, mass effect, or midline shift. Scattered foci susceptibility in the bilateral cerebral hemispheres and deep gray structures, likely sequela of prior infarcts and microhemorrhages. Confluent T2 hyperintense signal in the periventricular white matter, likely the sequela of severe chronic small vessel ischemic disease. No hydrocephalus or extra-axial collection. Vascular: Please see MRA findings below. Skull and upper cervical spine: Normal marrow signal. Sinuses/Orbits: No acute finding. Other: The mastoids are well aerated. MRA HEAD FINDINGS Anterior circulation: Both internal carotid arteries are patent to the termini, without significant stenosis. A1 segments  patent. Normal anterior communicating artery. Anterior cerebral arteries are patent to their distal aspects. No M1 stenosis or occlusion. Normal MCA bifurcations. Distal MCA branches perfused and symmetric. Posterior circulation: Evaluation of the proximal vertebral arteries is limited by motion artifact. Vertebral arteries otherwise patent to the vertebrobasilar junction without stenosis. Basilar patent to its distal aspect. Superior cerebellar arteries patent bilaterally. Patent P1 segments. PCAs perfused to their distal aspects without stenosis. The bilateral posterior communicating arteries are not visualized. Anatomic variants: None significant MRA NECK FINDINGS Evaluation is somewhat limited by motion artifact. No evidence of dissection, occlusion, or hemodynamically significant stenosis (greater than 50%). Suspect beading of the right-greater-than-left distal ICAs bilaterally (series 6, images 181-224 on the right and 216-228 on the left). IMPRESSION: 1. 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 Additional focal acute to subacute infarct involving the left caudate head 2. Additional smaller acute to subacute infarcts involving the left caudate tail, occipital lobe, and frontal lobe. 3. No definite hemodynamically significant stenosis in the neck. Possible beading of the right-greater-than-left distal ICAs, concerning for fibromuscular dysplasia. This could be better evaluated with a CTA of the neck if clinically indicated. 4. No intracranial large vessel occlusion or significant stenosis. These results were called by telephone at the time of interpretation on 02/23/2022 at 9:24 pm to provider Dr. Marlyce Huge, who verbally acknowledged these results. Electronically Signed   By: Merilyn Baba M.D.   On: 02/23/2022 21:24   MR ANGIO NECK WO CONTRAST  Result Date: 02/23/2022 CLINICAL DATA:  Stroke-like symptoms; right upper extremity  weakness, right facial droop, dysarthria EXAM: MRI HEAD WITHOUT CONTRAST MRA HEAD WITHOUT CONTRAST MRA NECK WITHOUT CONTRAST TECHNIQUE: Multiplanar, multi-echo pulse sequences of the brain and surrounding structures were acquired without intravenous contrast. Angiographic images of the Circle of Willis were acquired using MRA technique without intravenous contrast. Angiographic images of the neck were acquired using MRA technique withoutintravenous contrast. Carotid stenosis measurements (when applicable) are obtained utilizing NASCET criteria, using the distal internal carotid diameter as the denominator. COMPARISON:  MRI/MRA head 05/21/2019, correlation is made with CT head 02/23/2022 FINDINGS: MRI HEAD FINDINGS Brain: Cortical restricted diffusion with ADC correlate in the left posterior MCA territory, involving the left posterior frontal and anterior parietal lobes (series 5, image 87 and series 7, image 51), including the central sulcus. Focal restricted diffusion with ADC correlate in the left caudate head (series 5, image 78). Smaller foci of restricted diffusion with ADC correlates in the left  occipital lobe cortex and white matter (series 5, images 67, 68, 78), left caudate taill(series 5, image 78), and left frontal lobe (series 5, image 86). These areas are associated with increased T2 hyperintense signal. No acute hemorrhage, mass, mass effect, or midline shift. Scattered foci susceptibility in the bilateral cerebral hemispheres and deep gray structures, likely sequela of prior infarcts and microhemorrhages. Confluent T2 hyperintense signal in the periventricular white matter, likely the sequela of severe chronic small vessel ischemic disease. No hydrocephalus or extra-axial collection. Vascular: Please see MRA findings below. Skull and upper cervical spine: Normal marrow signal. Sinuses/Orbits: No acute finding. Other: The mastoids are well aerated. MRA HEAD FINDINGS Anterior circulation: Both internal  carotid arteries are patent to the termini, without significant stenosis. A1 segments patent. Normal anterior communicating artery. Anterior cerebral arteries are patent to their distal aspects. No M1 stenosis or occlusion. Normal MCA bifurcations. Distal MCA branches perfused and symmetric. Posterior circulation: Evaluation of the proximal vertebral arteries is limited by motion artifact. Vertebral arteries otherwise patent to the vertebrobasilar junction without stenosis. Basilar patent to its distal aspect. Superior cerebellar arteries patent bilaterally. Patent P1 segments. PCAs perfused to their distal aspects without stenosis. The bilateral posterior communicating arteries are not visualized. Anatomic variants: None significant MRA NECK FINDINGS Evaluation is somewhat limited by motion artifact. No evidence of dissection, occlusion, or hemodynamically significant stenosis (greater than 50%). Suspect beading of the right-greater-than-left distal ICAs bilaterally (series 6, images 181-224 on the right and 216-228 on the left). IMPRESSION: 1. 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 Additional focal acute to subacute infarct involving the left caudate head 2. Additional smaller acute to subacute infarcts involving the left caudate tail, occipital lobe, and frontal lobe. 3. No definite hemodynamically significant stenosis in the neck. Possible beading of the right-greater-than-left distal ICAs, concerning for fibromuscular dysplasia. This could be better evaluated with a CTA of the neck if clinically indicated. 4. No intracranial large vessel occlusion or significant stenosis. These results were called by telephone at the time of interpretation on 02/23/2022 at 9:24 pm to provider Dr. Marlyce Huge, who verbally acknowledged these results. Electronically Signed   By: Merilyn Baba M.D.   On: 02/23/2022 21:24   DG Chest Portable 1  View  Result Date: 02/23/2022 CLINICAL DATA:  Stroke EXAM: PORTABLE CHEST 1 VIEW COMPARISON:  Radiograph 01/29/2022 FINDINGS: Unchanged enlarged cardiomediastinal silhouette with prior median sternotomy. Right neck catheter tip overlies the right atrium. There is no focal airspace consolidation. There is no pleural effusion. No pneumothorax. No acute osseous abnormality. IMPRESSION: Unchanged cardiomegaly.  No new airspace disease. Electronically Signed   By: Maurine Simmering M.D.   On: 02/23/2022 15:06   CT Head Wo Contrast  Result Date: 02/23/2022 CLINICAL DATA:  RUE weakness, right facial droop, dysarthria EXAM: CT HEAD WITHOUT CONTRAST TECHNIQUE: Contiguous axial images were obtained from the base of the skull through the vertex without intravenous contrast. RADIATION DOSE REDUCTION: This exam was performed according to the departmental dose-optimization program which includes automated exposure control, adjustment of the mA and/or kV according to patient size and/or use of iterative reconstruction technique. COMPARISON:  CT head 12/24/2021. FINDINGS: Brain: Subtle high left frontoparietal hypoattenuation and possible loss of gray differentiation (for example see series 2, image 19 and series 5, image 33). No evidence of acute hemorrhage, mass lesion midline shift or hydrocephalus. Patchy white matter hypoattenuation, nonspecific but compatible with chronic microvascular disease. Vascular: Calcific atherosclerosis.  No convincing hyperdense vessel identified. Skull: No acute fracture. Sinuses/Orbits: Clear sinuses.  No acute orbital findings. Other: No mastoid effusions. Dr. Leonie Man paged at the time of dictation for call of report. IMPRESSION: 1. Subtle high left frontoparietal hypoattenuation and possible loss of gray differentiation, which could represent acute infarct (particularly given the clinical history) or artifact. Recommend MRI. 2. No acute hemorrhage. Electronically Signed   By: Margaretha Sheffield M.D.    On: 02/23/2022 14:32   CT Angio Chest/Abd/Pel for Dissection W and/or Wo Contrast  Result Date: 01/29/2022 CLINICAL DATA:  Chest and abdominal pain. Shortness of breath. History of thoracic aortic aneurysm repair. EXAM: CT ANGIOGRAPHY CHEST, ABDOMEN AND PELVIS TECHNIQUE: Non-contrast CT of the chest was initially obtained. Multidetector CT imaging through the chest, abdomen and pelvis was performed using the standard protocol during bolus administration of intravenous contrast. Multiplanar reconstructed images and MIPs were obtained and reviewed to evaluate the vascular anatomy. RADIATION DOSE REDUCTION: This exam was performed according to the departmental dose-optimization program which includes automated exposure control, adjustment of the mA and/or kV according to patient size and/or use of iterative reconstruction technique. CONTRAST:  132m OMNIPAQUE IOHEXOL 350 MG/ML SOLN COMPARISON:  12/07/2021.  06/05/2021. FINDINGS: CTA CHEST FINDINGS Cardiovascular: Sequelae of ascending thoracic aortic repair evident. Pre contrast imaging shows no hyperdense crescent in the wall of the thoracic aorta suggest the presence of an acute intramural hematoma. Pledgets in the region of the proximal ascending aorta compatible with the reported surgical history. Imaging after IV contrast administration reveals a small luminal outpouching in the ascending aorta just proximal to the origin of the right brachiocephalic artery (axial 437/1and coronal 48/9). This is unchanged over the prior 2 studies dating back to 06/05/2021 and is likely postsurgical in etiology. No evidence for dissection flap within the thoracic aorta. Arch vessel anatomy from the transverse aorta is widely patent. Right-sided central line tip is positioned in the right atrium. Pulmonary arteries are well opacified without evidence for pulmonary embolus. Mild atherosclerotic calcification is noted in the wall of the thoracic aorta. Heart is enlarged. No  pericardial effusion. Reflux of contrast material into the hepatic venous anatomy suggests right heart dysfunction. Mediastinum/Nodes: No mediastinal lymphadenopathy. There is no hilar lymphadenopathy. The esophagus has normal imaging features. There is no axillary lymphadenopathy. Lungs/Pleura: Small to moderate right pleural effusion is similar to minimally progressive in the interval. Patchy ground-glass opacity is noted in both lungs, mildly progressive in the interval. Similar appearance of collapse/consolidative opacity in the anterior left lower lobe. Musculoskeletal: No worrisome lytic or sclerotic osseous abnormality. Review of the MIP images confirms the above findings. CTA ABDOMEN AND PELVIS FINDINGS VASCULAR Aorta: Normal caliber aorta without aneurysm, dissection, vasculitis or significant stenosis. Atherosclerotic calcification evident. Celiac: Patent without evidence of aneurysm, dissection, vasculitis or significant stenosis. SMA: Patent without evidence of aneurysm, dissection, vasculitis or significant stenosis. Renals: Both renal arteries are patent without evidence of aneurysm, dissection, vasculitis, fibromuscular dysplasia or significant stenosis. IMA: Poorly opacified and patency cannot be confirmed. Inflow: Patent without evidence of aneurysm, dissection, vasculitis or significant stenosis. Veins: No obvious venous abnormality within the limitations of this arterial phase study. Review of the MIP images confirms the above findings. NON-VASCULAR Hepatobiliary: No suspicious focal arterial phase hyperenhancement. The vague hypodensity seen in the anterior liver on the previous exam is not evident today, potentially related to bolus timing. Gallbladder not well demonstrated. No intrahepatic or extrahepatic biliary dilation. Pancreas: No focal mass lesion. No dilatation of the main  duct. No intraparenchymal cyst. No peripancreatic edema. Spleen: No splenomegaly. No focal mass lesion.  Adrenals/Urinary Tract: No adrenal nodule or mass. 7 mm hyperattenuating or hyperenhancing subcapsular lesion noted upper pole left kidney on 149/6. This was not discernible on the previous exam. Right kidney unremarkable. No evidence for hydroureter. Bladder is nondistended. Stomach/Bowel: Stomach is nondistended. Duodenum is normally positioned as is the ligament of Treitz. No small bowel wall thickening. No small bowel dilatation. No gross colonic mass. No colonic wall thickening. Lymphatic: No abdominal lymphadenopathy. No pelvic sidewall lymphadenopathy. Reproductive: Calcified fibroids noted in the uterus. There is no adnexal mass. Other: Moderate volume free fluid seen in the pelvis, similar to prior. Musculoskeletal: 2 fascial defects are seen in the left flank region, 1 of which is between the eleventh and twelfth ribs and the second is more posterior and below the left twelfth rib. Both hernias contain only fat and appearance is similar to prior. Lumbar fusion hardware evident. No worrisome lytic or sclerotic osseous abnormality. Review of the MIP images confirms the above findings. IMPRESSION: 1. No evidence for acute intramural hematoma or dissection in the thoracic or abdominal aorta. 2. Status post ascending thoracic aortic repair with stable appearance since 06/05/2021 of small luminal outpouching in the ascending aorta adjacent to the origin of the right brachiocephalic artery and most likely postsurgical in etiology. 3. Small to moderate right pleural effusion is similar to minimally progressive in the interval. 4. Patchy ground-glass opacity in both lungs, mildly progressive in the interval. Imaging features may be related to pulmonary edema although atypical infection/inflammation could have this appearance. 5. 7 mm hyperattenuating or hyperenhancing subcapsular lesion upper pole left kidney, not discernible on the previous study. This may be a cyst complicated by proteinaceous debris or  hemorrhage, but neoplasm is not excluded. Follow-up nonemergent outpatient MRI suggested to further evaluate. 6. Moderate volume free fluid in the pelvis, similar to prior. 7. Aortic Atherosclerosis (ICD10-I70.0). Electronically Signed   By: Misty Stanley M.D.   On: 01/29/2022 13:22   DG Chest Portable 1 View  Result Date: 01/29/2022 CLINICAL DATA:  Chest pain and shortness of breath EXAM: PORTABLE CHEST 1 VIEW COMPARISON:  Chest radiograph 01/06/2022 FINDINGS: The right-sided dialysis catheter is stable terminating in the right atrium. Median sternotomy wires are unchanged. The heart is enlarged, unchanged. The upper mediastinal contours are stable. There are suspected trace bilateral pleural effusions with mild adjacent atelectasis. There is no other focal consolidation. There is no overt pulmonary edema. There is no pneumothorax The bones are stable. IMPRESSION: Cardiomegaly with suspected trace bilateral pleural effusions and mild bibasilar atelectasis. Electronically Signed   By: Valetta Mole M.D.   On: 01/29/2022 11:21     Discharge Exam: Vitals:   02/26/22 1100 02/26/22 1130  BP: 99/72 (!) 116/94  Pulse: 92   Resp: (!) 21   Temp:  98.7 F (37.1 C)  SpO2: 99%    Vitals:   02/26/22 1030 02/26/22 1100 02/26/22 1130 02/26/22 1207  BP: (!) 149/65 99/72 (!) 116/94   Pulse: (!) 32 92    Resp: 20 (!) 21    Temp:   98.7 F (37.1 C)   TempSrc:   Oral   SpO2: 100% 99%    Weight:    44.3 kg    General: Pt is alert, awake, not in acute distress Cardiovascular: RRR, S1/S2 +, no rubs, no gallops Respiratory: CTA bilaterally, no wheezing, no rhonchi Abdominal: Soft, NT, ND, bowel sounds + Extremities: no edema, no  cyanosis Neuro: Alert but has dysarthria.  Right-sided facial droop with 3/5 power in right upper extremity.   The results of significant diagnostics from this hospitalization (including imaging, microbiology, ancillary and laboratory) are listed below for reference.      Microbiology: No results found for this or any previous visit (from the past 240 hour(s)).   Labs: BNP (last 3 results) Recent Labs    10/29/21 0840 01/29/22 1128 02/23/22 1846  BNP >4,500.0* >4,500.0* >5,456.2*   Basic Metabolic Panel: Recent Labs  Lab 02/23/22 1444 02/23/22 1846 02/24/22 0411  NA 141  --  140  K 5.9* 4.0 4.0  CL 102  --  105  CO2 26  --  24  GLUCOSE 93  --  149*  BUN 42*  --  47*  CREATININE 8.96*  --  10.03*  CALCIUM 10.5*  --  9.6   Liver Function Tests: Recent Labs  Lab 02/23/22 1444  AST 44*  ALT 12  ALKPHOS 78  BILITOT 0.6  PROT 7.5  ALBUMIN 4.1   No results for input(s): "LIPASE", "AMYLASE" in the last 168 hours. No results for input(s): "AMMONIA" in the last 168 hours. CBC: Recent Labs  Lab 02/23/22 1846 02/26/22 0744  WBC 4.4 3.3*  NEUTROABS 2.8  --   HGB 11.8* 12.0  HCT 38.7 38.0  MCV 101.6* 97.7  PLT 284 261   Cardiac Enzymes: No results for input(s): "CKTOTAL", "CKMB", "CKMBINDEX", "TROPONINI" in the last 168 hours. BNP: Invalid input(s): "POCBNP" CBG: Recent Labs  Lab 02/23/22 1357 02/23/22 1755  GLUCAP 106* 103*   D-Dimer No results for input(s): "DDIMER" in the last 72 hours. Hgb A1c Recent Labs    02/23/22 1846  HGBA1C 4.7*   Lipid Profile Recent Labs    02/24/22 0411  CHOL 104  HDL 49  LDLCALC 46  TRIG 43  CHOLHDL 2.1   Thyroid function studies No results for input(s): "TSH", "T4TOTAL", "T3FREE", "THYROIDAB" in the last 72 hours.  Invalid input(s): "FREET3" Anemia work up No results for input(s): "VITAMINB12", "FOLATE", "FERRITIN", "TIBC", "IRON", "RETICCTPCT" in the last 72 hours. Urinalysis    Component Value Date/Time   COLORURINE AMBER (A) 12/27/2021 1840   APPEARANCEUR TURBID (A) 12/27/2021 1840   LABSPEC 1.013 12/27/2021 1840   PHURINE 8.0 12/27/2021 1840   GLUCOSEU NEGATIVE 12/27/2021 1840   GLUCOSEU NEG mg/dL 10/30/2009 1944   HGBUR MODERATE (A) 12/27/2021 1840    BILIRUBINUR NEGATIVE 12/27/2021 1840   BILIRUBINUR negative 05/04/2018 0910   KETONESUR NEGATIVE 12/27/2021 1840   PROTEINUR >=300 (A) 12/27/2021 1840   UROBILINOGEN 0.2 05/04/2018 0910   UROBILINOGEN 1 02/14/2014 0946   NITRITE NEGATIVE 12/27/2021 1840   LEUKOCYTESUR LARGE (A) 12/27/2021 1840   Sepsis Labs Recent Labs  Lab 02/23/22 1846 02/26/22 0744  WBC 4.4 3.3*   Microbiology No results found for this or any previous visit (from the past 240 hour(s)).   Time coordinating discharge: Over 30 minutes  SIGNED:   Darliss Cheney, MD  [facility] 02/26/2022, 2:43 PM *Please note that this is a verbal dictation therefore any spelling or grammatical errors are due to the "[place] [employer]" system interpretation. If 7PM-7AM, please contact night-coverage [email or web address]

## 2022-02-26 NOTE — Progress Notes (Signed)
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 Neurology consulted. Per primary.  Has a Dysphagia 2 diet now.   Hypertensive Emergency: Select home medications have been resumed. Historically BP has been well controlled while in hospital. Per neuro, allowing permissive HTN. PAF-Eliquis restarted at 2.5 mg BID  ESRD -  T,Th, S. HD on schedule, next 9/28.    Hypertension/volume  - HTN as noted above. No excess volume by exam or chest xray. UF as tolerated.   Anemia  - HGB 11.8. No ESA needed.   Metabolic bone disease -PO4 elevated at OP clinic. Resume binders, VDRA.   Nutrition - Albumin at goal. Renal diet.  Subjective:    Seen on dialysis.  No events overnight.  She is doing well, no needs expressed today   Objective:   BP (!) 146/84   Pulse 86   Temp 98.9 F (37.2 C) (Oral)   Resp 18   Wt 48.5 kg   SpO2 100%   BMI 18.35 kg/m   Physical Exam: Gen:NAD, sleeping on dialysis, easily arousable CVS:RRR Resp:clear Abd: soft Ext:no LE edema ACCESS: R IJ TDC NEURO: sig dysarthria with R facial droop and R arm weakness  Labs: BMET Recent Labs  Lab 02/23/22 1444 02/23/22 1846 02/24/22 0411  NA 141  --  140  K 5.9* 4.0 4.0  CL 102  --  105  CO2 26  --  24  GLUCOSE 93  --  149*  BUN 42*  --  47*  CREATININE 8.96*  --  10.03*  CALCIUM 10.5*  --  9.6   CBC Recent Labs  Lab 02/23/22 1846 02/26/22 0744  WBC 4.4 3.3*  NEUTROABS 2.8  --   HGB 11.8* 12.0  HCT 38.7 38.0  MCV 101.6* 97.7  PLT 284 261      Medications:     amLODipine  10 mg Oral Daily   apixaban  2.5 mg Oral BID   carvedilol  6.25 mg Oral BID WC   Chlorhexidine Gluconate Cloth  6 each Topical Daily   [START ON 02/28/2022] cloNIDine  0.1 mg  Transdermal Q Sat   doxercalciferol  4 mcg Intravenous Q T,Th,Sa-HD   hydrALAZINE  20 mg Intravenous Q6H   isosorbide mononitrate  30 mg Oral Daily   rosuvastatin  10 mg Oral Daily     Madelon Lips, MD [place] [facility] [phone] 02/26/2022, 9:34 AM

## 2022-02-26 NOTE — Procedures (Signed)
Patient seen and examined on Hemodialysis. BP (!) 146/84   Pulse 86   Temp 98.9 F (37.2 C) (Oral)   Resp 18   Wt 48.5 kg   SpO2 100%   BMI 18.35 kg/m   QB 400 mL/ min via TDC, UF goal 2L  Tolerating treatment without complaints at this time.   Madelon Lips MD [facility] [phone] 9:35 AM

## 2022-02-26 NOTE — H&P (Signed)
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  She presented to [facility] emergency department via EMS 9/25 and code stroke was activated.  She was seen in consultation by Dr. Leonie Man.  Cup included MRI of the brain which revealed acute/subacute cortical infarct of the left posterior frontal and anterior parietal lobes including the central sulcus and additional focal acute/subacute infarct involving the caudate head.  She has a history of paroxysmal atrial fibrillation on Eliquis.  MR angiogram of neck and brain without large vessel stenosis.  Concerned about possible fibromuscular dysplasia involving distal ICAs but no evidence of hemodynamically significant stenoses.  Her history also includes end-stage renal disease on intermittent hemodialysis and nephrology was consulted.  She is tolerating dysphagia 2 diet with thin liquids. 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   Past medical history includes thoracic aortic aneurysm repair, anterior lumbar fusion, breast cancer.   Patient is alert and oriented to place, name, year and situation. I asked her about her code status and she currently is not wearing a DNR bracelet. She indicates to me that she desires chest compression, shock and intubation in the event of code blue. I have changed her code status to full code.  Of note, at time of admission exam, patient periodically sat up in bed and clutched her left chest for 1-2 minutes, endorsing a cramping/aching pain, then would return to laying in bed. She states this has occurred in the  past. Nursing called in to assess patient, called rapid response and EKG obtained showing tachycardia 110s with prior existing bundle branch black and no new STE noted. Plan of care determined to be pain control and troponin trending. No further action required.    Review of Systems  Constitutional:  Negative for fever.  Respiratory:  Negative for cough.   Cardiovascular:  Negative for chest pain.  Gastrointestinal:  Negative for vomiting.  Neurological:  Positive for weakness.    Limited due to dysarthria     Past Medical History:  Diagnosis Date   AF (paroxysmal atrial fibrillation) ([place]) 05/29/2019    on Coumadin   Aortic atherosclerosis ([place]) 07/05/2019   Aortic dissection ([place]) 04/04/2019    s/p repair   Bone spur 2008    Right calcaneal foot spur   Breast cancer ([place]) 2004    Ductal carcinoma in situ of the left breast; S/P left partial mastectomy 02/26/2003; S/P re-excision of cranial and lateral margins11/18/2004.radiation   Cerebral thrombosis with cerebral infarction 05/22/2019   Chronic low back pain 06/22/2016   Chronic obstructive lung disease ([place]) 01/16/2017   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 Autumn Messing; S/P re-excision of superior and inferior margins 10/27/2012.    ESRD on hemodialysis ([place]) 05/29/2019   Essential hypertension 09/16/2006   GERD 09/16/2006   Hepatitis C      treated and RNA confirmed not detectable 01/2017   Insomnia 03/14/2015   Malnutrition of moderate degree 05/19/2019   Non compliance with medical treatment 12/04/2017   Normocytic anemia      With thrombocytosis   Osteoarthritis  Right ureteral stone 2002   S/P lumbar spinal fusion 01/18/2014    S/P lumbar decompressive laminectomy, fusion, and plating for lumbar spinal stenosis on 05/27/2009 by Dr. Eustace Moore.  S/P anterolateral retroperitoneal interbody fusion L2-3 utilizing a 8 mm peek interbody cage packed with  morcellized allograft, and anterior lumbar plating L2-3 for recurrent disc herniation L2-3 with spinal stenosis on 01/18/2014 by Dr. Eustace Moore.     Tobacco use disorder 04/19/2009   Uterine fibroid     Wears dentures      top         Past Surgical History:  Procedure Laterality Date   ANTERIOR LAT LUMBAR FUSION N/A 01/18/2014    Procedure: ANTERIOR LATERAL LUMBAR FUSION LUMBAR TWO-THREE;  Surgeon: Eustace Moore, MD;  Location: [facility] [facility];  Service: Neurosurgery;  Laterality: N/A;   ANTERIOR LUMBAR FUSION   01/18/2014   AV FISTULA PLACEMENT Left 04/20/2019    Procedure: ARTERIOVENOUS (AV) FISTULA CREATION;  Surgeon: [name], MD;  Location: [place];  Service: Vascular;  Laterality: Left;   BACK SURGERY       BREAST LUMPECTOMY Left 01/2003   BREAST LUMPECTOMY Right 2014   BREAST LUMPECTOMY WITH NEEDLE LOCALIZATION AND AXILLARY SENTINEL LYMPH NODE BX Right 10/13/2012    Procedure: BREAST LUMPECTOMY WITH NEEDLE LOCALIZATION;  Surgeon: [name], MD;  Location: [place];  Service: General;  Laterality: Right;  Right breast wire localized lumpectomy   INSERTION OF DIALYSIS CATHETER Right 04/20/2019    Procedure: INSERTION OF DIALYSIS CATHETER, right internal jugular;  Surgeon: [name], MD;  Location: [place];  Service: Vascular;  Laterality: Right;   INSERTION OF DIALYSIS CATHETER Right 09/24/2020    Procedure: INSERTION OF TUNNELED DIALYSIS CATHETER;  Surgeon: Rosetta Posner, MD;  Location: [place];  Service: Vascular;  Laterality: Right;   IR FLUORO GUIDE CV LINE RIGHT   09/22/2020   IR THORACENTESIS ASP PLEURAL SPACE W/IMG GUIDE   05/19/2019   IR US GUIDE VASC ACCESS LEFT   09/22/2020   IR US GUIDE VASC ACCESS RIGHT   09/22/2020   IR VENOCAVAGRAM SVC   09/22/2020   LAMINECTOMY   05/27/2009    Lumbar decompressive laminectomy, fusion and plating for lumbar spinal stensosis   LIGATION OF ARTERIOVENOUS  FISTULA Left 09/24/2020    Procedure:  LIGATION OF LEFT ARM ARTERIOVENOUS  FISTULA;  Surgeon: Rosetta Posner, MD;  Location: [place];  Service: Vascular;  Laterality: Left;   LUMBAR LAMINECTOMY/DECOMPRESSION MICRODISCECTOMY Left 03/23/2013    Procedure: LUMBAR LAMINECTOMY/DECOMPRESSION MICRODISCECTOMY 1 LEVEL;  Surgeon: Eustace Moore, MD;  Location: [facility];  Service: Neurosurgery;  Laterality: Left;  LUMBAR LAMINECTOMY/DECOMPRESSION MICRODISCECTOMY 1 LEVEL   MASTECTOMY, PARTIAL Left 02/26/2003    ; S/P re-excision of cranial and lateral margins 04/19/2003.    RE-EXCISION OF BREAST CANCER,SUPERIOR MARGINS Right 10/27/2012    Procedure: RE-EXCISION OF BREAST CANCER,SUPERIOR and inferior MARGINS;  Surgeon: [name], MD;  Location: [place];  Service: General;  Laterality: Right;   RE-EXCISION OF BREAST LUMPECTOMY Left 04/2003   TEE WITHOUT CARDIOVERSION N/A 04/04/2019    Procedure: Transesophageal Echocardiogram (Tee);  Surgeon: Wonda Olds, MD;  Location: [place];  Service: Open Heart Surgery;  Laterality: N/A;   THORACIC AORTIC ANEURYSM REPAIR N/A 04/04/2019    Procedure: THORACIC ASCENDING ANEURYSM REPAIR (AAA)  USING 28 MM X 30 CM HEMASHIELD PLATINUM VASCULAR GRAFT;  Surgeon: Wonda Olds, MD;  [facility];  Service: Open Heart Surgery;  Laterality: N/A;         Family History  Problem Relation Age of Onset   Colon cancer Mother 64   Hypertension Mother     Diabetes Sister 47   Hypertension Sister     Diabetes Brother     Hypertension Brother     Diabetes Brother     Hypertension Brother     Kidney disease Son          s/p renal transplant   Hypertension Son     Diabetes Son     Multiple sclerosis Son     Bone cancer Sister 35   Breast cancer Neg Hx     Cervical cancer Neg Hx      Social History:  reports that she has been smoking cigarettes. She has a 11.00 pack-year smoking history. She has never used smokeless tobacco. She reports that she does not currently use alcohol after a past usage of about 2.0  standard drinks of alcohol per week. She reports that she does not currently use drugs after having used the following drugs: Cocaine. Allergies:       Allergies  Allergen Reactions   Shrimp [Shellfish Allergy] Shortness Of Breath   Bactroban [Mupirocin] Other (See Comments)      "Sores in nose"   Tylenol [Acetaminophen] Itching   Zestril [Lisinopril] Cough          Medications Prior to Admission  Medication Sig Dispense Refill   albuterol (VENTOLIN HFA) 108 (90 Base) MCG/ACT inhaler Inhale 2 puffs into the lungs every 6 (six) hours as needed for wheezing or shortness of breath. 8.5 g 0   amLODipine (NORVASC) 10 MG tablet Take 1 tablet (10 mg total) by mouth daily. 90 tablet 0   apixaban (ELIQUIS) 2.5 MG TABS tablet Take 1 tablet (2.5 mg total) by mouth 2 (two) times daily. 60 tablet 0   carvedilol (COREG) 6.25 MG tablet Take 1 tablet (6.25 mg total) by mouth 2 (two) times daily with a meal. 60 tablet 1   Cholecalciferol (VITAMIN D-3 PO) Take 1 capsule by mouth daily.       cloNIDine (CATAPRES - DOSED IN MG/24 HR) 0.2 mg/24hr patch Place 1 patch (0.2 mg total) onto the skin every Saturday. 4 patch 12   hydrALAZINE (APRESOLINE) 50 MG tablet Take 1 tablet (50 mg total) by mouth 3 (three) times daily. 90 tablet 0   isosorbide mononitrate (IMDUR) 30 MG 24 hr tablet Take 2 tablets (60 mg total) by mouth daily. (Patient taking differently: Take 30 mg by mouth daily.) 30 tablet 0   losartan (COZAAR) 100 MG tablet Take 1 tablet (100 mg total) by mouth daily. 90 tablet 0   mirtazapine (REMERON) 7.5 MG tablet Take 1 tablet (7.5 mg total) by mouth at bedtime. (Patient taking differently: Take 7.5 mg by mouth at bedtime as needed (sleep).) 90 tablet 3   rosuvastatin (CRESTOR) 10 MG tablet Take 10 mg by mouth daily.       sevelamer carbonate (RENVELA) 2.4 g PACK 2.4 g with breakfast, with lunch, and with evening meal.       torsemide 40 MG TABS Take 40 mg by mouth 2 (two) times daily. 60 tablet 0           Home: Home Living Family/patient expects to be discharged to:: Private residence Living Arrangements: Non-relatives/Friends Available Help at Discharge: Friend(s) Type of Home: Apartment Home Access: Other (comment) (chair lift  to her apartment?) Home Layout: One level Bathroom Shower/Tub: Walk-in shower, Sponge bathes at [place]: [place]: Conservation officer, nature (2 wheels), [employer] - single point, BSC/3in1 Additional Comments: difficult to understand at times- I think this is correct   Functional History: Prior Function Prior Level of Function : Independent/Modified Independent, Driving, History of Falls (last six months) Mobility Comments: no AD, reports 1 fall in the last 6 months ADLs Comments: independent ADLs, IADLs, driving (NP from nephrology reports 11th admission this year, medical non compliance)   Functional Status:  Mobility: Bed Mobility Overal bed mobility: Needs Assistance Bed Mobility: Supine to Sit, Sit to Supine Supine to sit: Min assist Sit to supine: Min assist General bed mobility comments: Pt attempting to manage her RUE, however unable to completely keep in appropriate position. Assist provided for full RUE management, as well as for trunk support as pt scooted out to EOB. Transfers Overall transfer level: Needs assistance Equipment used: None Transfers: Sit to/from Stand Sit to Stand: Min assist General transfer comment: to power up and steady Ambulation/Gait Ambulation/Gait assistance: Min assist Gait Distance (Feet): 200 Feet Assistive device: 1 person hand held assist Gait Pattern/deviations: Step-through pattern, Decreased stride length, Antalgic, Drifts right/left, Narrow base of support General Gait Details: HHA for balance support and safety. Therapist guiding pt in hall by [facility] and gait belt due to drifting. Grossly unsteady, requiring min assist throughout. Gait velocity: Decreased Gait velocity interpretation: <1.8  ft/sec, indicate of risk for recurrent falls   ADL: ADL Overall ADL's : Needs assistance/impaired Grooming: Minimal assistance, Sitting Lower Body Bathing: Minimal assistance, Sit to/from stand Upper Body Dressing : Minimal assistance, Sitting Lower Body Dressing: Moderate assistance, Sit to/from stand Toilet Transfer: Minimal assistance, Ambulation Toilet Transfer Details (indicate cue type and reason): simulated at [place] and Hygiene: Moderate assistance, Sit to/from stand Toileting - Clothing Manipulation Details (indicate cue type and reason): for clothing mgmt on R side, min assist dynamically when completing hygiene due to purewick leaking Functional mobility during ADLs: Minimal assistance, Cueing for safety General ADL Comments: pt limited by R UE hemiparesis, cognition, balance   Cognition: Cognition Overall Cognitive Status: Impaired/Different from baseline Arousal/Alertness: Awake/alert Orientation Level: Oriented to person, Oriented to situation Attention: Focused Focused Attention: Appears intact Memory:  (TBA) Awareness: Appears intact Problem Solving:  (TBA) Safety/Judgment: Impaired Cognition Arousal/Alertness: Awake/alert Behavior During Therapy: Flat affect Overall Cognitive Status: Impaired/Different from baseline Area of Impairment: Following commands, Awareness, Problem solving, Safety/judgement Following Commands: Follows one step commands consistently, Follows one step commands with increased time, Follows multi-step commands inconsistently Safety/Judgement: Decreased awareness of deficits, Decreased awareness of safety Awareness: Intellectual Problem Solving: Slow processing, Difficulty sequencing, Requires verbal cues General Comments: pt oriented, follows simple commands with increased time.  Decreased awareness to deficits and safety.  Pt reports the bed pad is wet, but not her underwear initally; then once changed the bed  pad reports she needed to take off her underewar.   Physical Exam: Blood pressure (!) 116/94, pulse 92, temperature 98.7 F (37.1 C), temperature source Oral, resp. rate (!) 21, weight 44.3 kg, SpO2 99 %. Physical Exam Constitutional: Intermittent distress w/ chest pain. Appropriate appearance for age.  HENT: No JVD. Neck Supple. Trachea midline. Atraumatic, normocephalic. Eyes: PERRLA. EOMI. Visual fields grossly intact.  Cardiovascular: Intermittent irregular rhythm with tachycardia into 120s, no murmurs/rub/gallops. No Edema. Peripheral pulses 2+  Respiratory: CTAB. No rales, rhonchi, or wheezing. On RA.  Abdomen: + bowel sounds, normoactive. No distention  or tenderness.  GU: Not examined. Skin: C/D/I. No apparent lesions. MSK:      No apparent deformity. Chest pain not reproducible on palpation.       Strength:                RUE: 3-/5 SA, 2+/5 EF, 2+/5 EE, 3/5 WE, 3/5 FF, 3/5 FA                 LUE: 5/5 SA, 5/5 EF, 5/5 EE, 5/5 WE, 5/5 FF, 5/5 FA                 RLE: 5/5 HF, 5/5 KE, 5/5 DF, 5/5 EHL, 5/5 PF                 LLE:  4/5 HF, 4/5 KE, 5-/5 DF, 5-/5 EHL, 5-/5 PF   Neurologic exam:  Cognition: AAO to person, place, time and event.  Language: Severe dysarthria, difficult to understand but fluent with appropriate responses.  Insight: Good insight into current condition.  Mood: Pleasant affect, appropriate mood. Tries to rush through speech, needs cueing to slow down for intelligibility.  Sensation: To light touch intact in BL UEs and LEs  Reflexes: 2+ in BL UE and LEs. Negative Hoffman's and babinski signs bilaterally.  CN: + R facial droop, + R tongue deviation, + R shoulder weakness; otherwise intact Coordination: No apparent tremors. No ataxia on FTN, HTS bilaterally.  Spasticity: MAS 0 in all extremities.         Lab Results Last 48 Hours        Results for orders placed or performed during the hospital encounter of 02/23/22 (from the past 48 hour(s))  Troponin I  (High Sensitivity)     Status: Abnormal    Collection Time: 02/25/22  4:32 AM  Result Value Ref Range    Troponin I (High Sensitivity) 135 (HH) <18 ng/L      Comment: CRITICAL VALUE NOTED.  VALUE IS CONSISTENT WITH PREVIOUSLY REPORTED AND CALLED VALUE. (NOTE) Elevated high sensitivity troponin I (hsTnI) values and significant  changes across serial measurements may suggest ACS but many other  chronic and acute conditions are known to elevate hsTnI results.  Refer to the "Links" section for chest pain algorithms and additional  guidance. Performed at [facility], [place] [address] [place], [place] [address]    CBC     Status: Abnormal    Collection Time: 02/26/22  7:44 AM  Result Value Ref Range    WBC 3.3 (L) 4.0 - 10.5 K/uL    RBC 3.89 3.87 - 5.11 MIL/uL    Hemoglobin 12.0 12.0 - 15.0 g/dL    HCT 38.0 36.0 - 46.0 %    MCV 97.7 80.0 - 100.0 fL    MCH 30.8 26.0 - 34.0 pg    MCHC 31.6 30.0 - 36.0 g/dL    RDW 15.8 (H) 11.5 - 15.5 %    Platelets 261 150 - 400 K/uL    nRBC 0.0 0.0 - 0.2 %      Comment: Performed at [facility], [place] [address], [place], [place] [address]       Imaging Results (Last 48 hours)  ECHOCARDIOGRAM COMPLETE   Result Date: 02/25/2022    ECHOCARDIOGRAM REPORT   Patient Name:   JUWANA THORESON Date of Exam: 02/25/2022 Medical Rec #:  [id number]          Height:       64.0 in  Accession #:    [id number]         Weight:       100.7 lb Date of Birth:  [birth year]           BSA:          1.462 m Patient Age:    88 years           BP:           157/97 mmHg Patient Gender: F                  HR:           94 bpm. Exam Location:  Inpatient Procedure: 2D Echo, Cardiac Doppler and Color Doppler Indications:    Stroke  History:        Patient has prior history of Echocardiogram examinations, most                 recent 06/19/2021. Risk Factors:Hypertension.  Sonographer:    Jefferey Pica Referring Phys: [id number] [place]  1. Left ventricular  ejection fraction, by estimation, is 30 to 35%. Left ventricular ejection fraction by PLAX is 30 %. The left ventricle has moderately decreased function. The left ventricle demonstrates global hypokinesis. There is severe concentric left ventricular hypertrophy. Left ventricular diastolic parameters are consistent with Grade I diastolic dysfunction (impaired relaxation).  2. Right ventricular systolic function is normal. The right ventricular size is normal. There is normal pulmonary artery systolic pressure.  3. Left atrial size was severely dilated.  4. The mitral valve is normal in structure. Trivial mitral valve regurgitation. No evidence of mitral stenosis.  5. The aortic valve is tricuspid. Aortic valve regurgitation is not visualized. No aortic stenosis is present.  6. The inferior vena cava is normal in size with greater than 50% respiratory variability, suggesting right atrial pressure of 3 mmHg. FINDINGS  Left Ventricle: Left ventricular ejection fraction, by estimation, is 30 to 35%. Left ventricular ejection fraction by PLAX is 30 %. The left ventricle has moderately decreased function. The left ventricle demonstrates global hypokinesis. The left ventricular internal cavity size was normal in size. There is severe concentric left ventricular hypertrophy. Left ventricular diastolic parameters are consistent with Grade I diastolic dysfunction (impaired relaxation). Right Ventricle: The right ventricular size is normal. No increase in right ventricular wall thickness. Right ventricular systolic function is normal. There is normal pulmonary artery systolic pressure. The tricuspid regurgitant velocity is 2.33 m/s, and  with an assumed right atrial pressure of 3 mmHg, the estimated right ventricular systolic pressure is 53.9 mmHg. Left Atrium: Left atrial size was severely dilated. Right Atrium: Right atrial size was normal in size. Pericardium: There is no evidence of pericardial effusion. Mitral Valve: The  mitral valve is normal in structure. Trivial mitral valve regurgitation. No evidence of mitral valve stenosis. Tricuspid Valve: The tricuspid valve is normal in structure. Tricuspid valve regurgitation is mild . No evidence of tricuspid stenosis. Aortic Valve: The aortic valve is tricuspid. Aortic valve regurgitation is not visualized. No aortic stenosis is present. Aortic valve peak gradient measures 7.2 mmHg. Pulmonic Valve: The pulmonic valve was normal in structure. Pulmonic valve regurgitation is not visualized. No evidence of pulmonic stenosis. Aorta: The aortic root is normal in size and structure. Venous: The inferior vena cava is normal in size with greater than 50% respiratory variability, suggesting right atrial pressure of 3 mmHg. IAS/Shunts: No atrial level shunt detected by color flow Doppler.  LEFT VENTRICLE  PLAX 2D LV EF:         Left ventricular ejection fraction by PLAX is 30 %. LVIDd:         3.60 cm LVIDs:         3.10 cm LV PW:         2.00 cm LV IVS:        1.50 cm LVOT diam:     1.90 cm LV SV:         41 LV SV Index:   28 LVOT Area:     2.84 cm  RIGHT VENTRICLE            IVC RV Basal diam:  3.10 cm    IVC diam: 1.10 cm RV S prime:     6.02 cm/s LEFT ATRIUM             Index        RIGHT ATRIUM           Index LA diam:        3.30 cm 2.26 cm/m   RA Area:     14.30 cm LA Vol (A2C):   65.3 ml 44.67 ml/m  RA Volume:   38.30 ml  26.20 ml/m LA Vol (A4C):   37.2 ml 25.45 ml/m LA Biplane Vol: 51.3 ml 35.09 ml/m  AORTIC VALVE                 PULMONIC VALVE AV Area (Vmax): 2.13 cm     PV Vmax:       0.73 m/s AV Vmax:        134.50 cm/s  PV Peak grad:  2.1 mmHg AV Peak Grad:   7.2 mmHg LVOT Vmax:      101.00 cm/s LVOT Vmean:     59.200 cm/s LVOT VTI:       0.144 m  AORTA Ao Root diam: 2.80 cm Ao Asc diam:  2.90 cm MITRAL VALVE               TRICUSPID VALVE MV Area (PHT): 5.02 cm    TR Peak grad:   21.7 mmHg MV Decel Time: 151 msec    TR Vmax:        233.00 cm/s MV E velocity: 69.40 cm/s MV A  velocity: 97.30 cm/s  SHUNTS MV E/A ratio:  0.71        Systemic VTI:  0.14 m                            Systemic Diam: 1.90 cm Skeet Latch MD Electronically signed by Skeet Latch MD Signature Date/Time: 02/25/2022/4:54:01 PM    Final     DG Swallowing Func-Speech Pathology   Result Date: 02/25/2022 Table formatting from the original result was not included. Objective Swallowing Evaluation: Type of Study: MBS-Modified Barium Swallow Study  Patient Details Name: BEAUTIFULL CISAR MRN: [id number] Date of Birth: 15-Jun-1950 Today's Date: 02/25/2022 Time: SLP Start Time (ACUTE ONLY): 2671 -SLP Stop Time (ACUTE ONLY): 1301 SLP Time Calculation (min) (ACUTE ONLY): 13 min Past Medical History: Past Medical History: Diagnosis Date  AF (paroxysmal atrial fibrillation) ([place]) 05/29/2019  on Coumadin  Aortic atherosclerosis ([place]) 07/05/2019  Aortic dissection ([place]) 04/04/2019  s/p repair  Bone spur 2008  Right calcaneal foot spur  Breast cancer ([place]) 2004  Ductal carcinoma in situ of the left breast; S/P left partial mastectomy 02/26/2003; S/P re-excision of cranial and lateral margins11/18/2004.radiation  Cerebral thrombosis with cerebral infarction 05/22/2019  Chronic low back pain 06/22/2016  Chronic obstructive lung disease ([place]) 01/16/2017  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 Autumn Messing; S/P re-excision of superior and inferior margins 10/27/2012.   ESRD on hemodialysis ([place]) 05/29/2019  Essential hypertension 09/16/2006  GERD 09/16/2006  Hepatitis C   treated and RNA confirmed not detectable 01/2017  Insomnia 03/14/2015  Malnutrition of moderate degree 05/19/2019  Non compliance with medical treatment 12/04/2017  Normocytic anemia   With thrombocytosis  Osteoarthritis   Right ureteral stone 2002  S/P lumbar spinal fusion 01/18/2014  S/P lumbar decompressive laminectomy, fusion, and plating for lumbar spinal stenosis on 05/27/2009 by Dr. Eustace Moore.   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 Eustace Moore.    Tobacco use disorder 04/19/2009  Uterine fibroid   Wears dentures   top Past Surgical History: Past Surgical History: Procedure Laterality Date  ANTERIOR LAT LUMBAR FUSION N/A 01/18/2014  Procedure: ANTERIOR LATERAL LUMBAR FUSION LUMBAR TWO-THREE;  Surgeon: Eustace Moore, MD;  Location: [facility];  Service: Neurosurgery;  Laterality: N/A;  ANTERIOR LUMBAR FUSION  01/18/2014  AV FISTULA PLACEMENT Left 04/20/2019  Procedure: ARTERIOVENOUS (AV) FISTULA CREATION;  Surgeon: [name], MD;  Location: [place];  Service: Vascular;  Laterality: Left;  BACK SURGERY    BREAST LUMPECTOMY Left 01/2003  BREAST LUMPECTOMY Right 2014  BREAST LUMPECTOMY WITH NEEDLE LOCALIZATION AND AXILLARY SENTINEL LYMPH NODE BX Right 10/13/2012  Procedure: BREAST LUMPECTOMY WITH NEEDLE LOCALIZATION;  Surgeon: [name], MD;  Location: [place];  Service: General;  Laterality: Right;  Right breast wire localized lumpectomy  INSERTION OF DIALYSIS CATHETER Right 04/20/2019  Procedure: INSERTION OF DIALYSIS CATHETER, right internal jugular;  Surgeon: [name], MD;  Location: [place];  Service: Vascular;  Laterality: Right;  INSERTION OF DIALYSIS CATHETER Right 09/24/2020  Procedure: INSERTION OF TUNNELED DIALYSIS CATHETER;  Surgeon: Rosetta Posner, MD;  Location: [place];  Service: Vascular;  Laterality: Right;  IR FLUORO GUIDE CV LINE RIGHT  09/22/2020  IR THORACENTESIS ASP PLEURAL SPACE W/IMG GUIDE  05/19/2019  IR US GUIDE VASC ACCESS LEFT  09/22/2020  IR US GUIDE VASC ACCESS RIGHT  09/22/2020  IR VENOCAVAGRAM SVC  09/22/2020  LAMINECTOMY  05/27/2009  Lumbar decompressive laminectomy, fusion and plating for lumbar spinal stensosis  LIGATION OF  ARTERIOVENOUS  FISTULA Left 09/24/2020  Procedure: LIGATION OF LEFT ARM ARTERIOVENOUS  FISTULA;  Surgeon: Rosetta Posner, MD;  Location: [place];  Service: Vascular;  Laterality: Left;  LUMBAR LAMINECTOMY/DECOMPRESSION MICRODISCECTOMY Left 03/23/2013  Procedure: LUMBAR LAMINECTOMY/DECOMPRESSION MICRODISCECTOMY 1 LEVEL;  Surgeon: Eustace Moore, MD;  Location: [facility];  Service: Neurosurgery;  Laterality: Left;  LUMBAR LAMINECTOMY/DECOMPRESSION MICRODISCECTOMY 1 LEVEL  MASTECTOMY, PARTIAL Left 02/26/2003  ; S/P re-excision of cranial and lateral margins 04/19/2003.   RE-EXCISION OF BREAST CANCER,SUPERIOR MARGINS Right 10/27/2012  Procedure: RE-EXCISION OF BREAST CANCER,SUPERIOR and inferior MARGINS;  Surgeon: [name], MD;  Location: [place];  Service: General;  Laterality: Right;  RE-EXCISION OF BREAST LUMPECTOMY Left 04/2003  TEE WITHOUT CARDIOVERSION N/A 04/04/2019  Procedure: Transesophageal Echocardiogram (Tee);  Surgeon: Wonda Olds, MD;  Location: [place];  Service: Open Heart Surgery;  Laterality: N/A;  THORACIC AORTIC ANEURYSM  REPAIR N/A 04/04/2019  Procedure: THORACIC ASCENDING ANEURYSM REPAIR (AAA)  USING 28 MM X 30 CM HEMASHIELD PLATINUM VASCULAR GRAFT;  Surgeon: Wonda Olds, MD;  Location: [facility];  Service: Open Heart Surgery;  Laterality: N/A; HPI: Pt is a 71 y.o. female with medical history significant of ESRD on HD, PAF on Eliquis, aortic dissection status postrepair, HFrEF, refractory HTN, presented with strokelike right sided deficits (facial droop, slurred speech, right sided weakness). MRI brain showed acute changes to left posterior frontal, anterior parietal lobe, including the central sulcus, the left caudate head, and occipital lobe.  No data recorded  Recommendations for follow up therapy are one component of a multi-disciplinary discharge planning process, led by the attending physician.  Recommendations may be updated based on patient status, additional functional criteria and  insurance authorization. Assessment / Plan / Recommendation   02/25/2022   1:06 PM Clinical Impressions Clinical Impression Pt alert and attentive throughout study. Pt presents with minimal oropharyngeal dysphagia marked by swallow initiation delay to the pyriform sinuses, decreased bolus cohesion, and right anterior bolus loss. Pt displayed swallow initiation delay with thin liquids and pill administration (whole in puree). Right anterior loss, anterior sulcus pocketing, and decreased bolus cohesion noted with thin liquids. Hyolaryngeal excursion, pharyngeal contractions and epiglottic inversion adequate for airway protection. No airway intrusion noted throughout study. Despite noted right sided facial weakness, no buccal pocketing of bolus present. Pt edentulous, however, mastication of regular consistency presents as functional. Per chart review, RN reports pt had worsening dysphagia overnight (9/26) marked by increased coughing possibly due to lethargic state. Recommend pt start thin liquid/dys 2 (chopped/minced) diet. Full supervision to monitor alertness, ensure pt upright while eating and crush pills. ST will follow for diet appropriateness/advancement. SLP Visit Diagnosis Dysphagia, oral phase (R13.11);Dysphagia, oropharyngeal phase (R13.12) Impact on safety and function Mild aspiration risk     02/25/2022   1:06 PM Treatment Recommendations Treatment Recommendations Therapy as outlined in treatment plan below     02/25/2022   1:06 PM Prognosis Prognosis for Safe Diet Advancement Good Barriers to Reach Goals Severity of deficits   02/25/2022   1:06 PM Diet Recommendations SLP Diet Recommendations Thin liquid;Dysphagia 2 (Fine chop) solids Liquid Administration via Straw;Cup Medication Administration Crushed with puree Compensations Small sips/bites;Slow rate;Minimize environmental distractions;Monitor for anterior loss Postural Changes Seated upright at 90 degrees     02/25/2022   1:06 PM Other Recommendations  Oral Care Recommendations Oral care BID Follow Up Recommendations Acute inpatient rehab (3hours/day) Assistance recommended at discharge Intermittent Supervision/Assistance Functional Status Assessment Patient has had a recent decline in their functional status and demonstrates the ability to make significant improvements in function in a reasonable and predictable amount of time.   02/25/2022   1:06 PM Frequency and Duration  Speech Therapy Frequency (ACUTE ONLY) min 2x/week Treatment Duration 2 weeks     02/25/2022   1:06 PM Oral Phase Oral Phase Impaired Oral - Pudding Teaspoon NT Oral - Pudding Cup NT Oral - Thin Teaspoon NT Oral - Thin Cup Right anterior bolus loss;Weak lingual manipulation;Pocketing in anterior sulcus;Decreased bolus cohesion Oral - Thin Straw Right anterior bolus loss;Weak lingual manipulation;Pocketing in anterior sulcus;Decreased bolus cohesion Oral - Puree WFL Oral - Regular WFL Oral - Pill Reduced posterior propulsion;Holding of bolus    02/25/2022   1:06 PM Pharyngeal Phase Pharyngeal Phase Impaired Pharyngeal- Thin Cup Delayed swallow initiation-pyriform sinuses Pharyngeal- Thin Straw Delayed swallow initiation-vallecula Pharyngeal- Puree Delayed swallow initiation-vallecula Pharyngeal- Regular WFL Pharyngeal- Multi-consistency NT Pharyngeal-  Pill [place]    02/25/2022   1:06 PM Cervical Esophageal Phase  Cervical Esophageal Phase WFL [place] 02/25/2022, 2:38 PM                      MR BRAIN WO CONTRAST   Result Date: 02/25/2022 CLINICAL DATA:  Stroke-like symptoms; right-sided facial droop and right arm weakness EXAM: MRI HEAD WITHOUT CONTRAST TECHNIQUE: Multiplanar, multiecho pulse sequences of the brain and surrounding structures were obtained without intravenous contrast. COMPARISON:  02/23/2022 MRA head FINDINGS: Brain: Redemonstrated restricted diffusion with ADC correlate in the left posterior frontal and anterior parietal lobe cortex left caudate head, left occipital  lobe cortex and white matter, left caudate tail, and left frontal lobe white matter. Increased conspicuity of additional punctate cortical infarcts in the left frontal lobe (series 5, image 78 and left temporal lobe (series 5, image 65), which in retrospect were present on the prior exam but less hyperintense. All of these foci are associated with increased T2 hyperintense signal. No acute hemorrhage, mass mass effect, or midline shift. No hydrocephalus or extra-axial collection. Redemonstrated scattered foci of susceptibility in the bilateral cerebral hemispheres and deep gray structures, likely sequela of prior infarcts and microhemorrhages. Confluent T2 hyperintense signal in the periventricular white matter, likely the sequela of severe chronic small vessel ischemic disease. Vascular: Normal arterial flow voids. Skull and upper cervical spine: Normal marrow signal. Sinuses/Orbits: No acute finding. Other: The mastoids are well aerated. Fluid in the left petrous apex. IMPRESSION: Redemonstrated acute infarcts in the left cerebral hemisphere, with a few punctate infarcts in the left frontal and temporal lobes likely present on the prior exam in retrospect but now increased in conspicuity. No definite new acute infarct. No evidence of hemorrhagic transformation, mass effect, or midline shift. Electronically Signed   By: Merilyn Baba M.D.   On: 02/25/2022 02:33          Blood pressure (!) 116/94, pulse 92, temperature 98.7 F (37.1 C), temperature source Oral, resp. rate (!) 21, weight 44.3 kg, SpO2 99 %.   Medical Problem List and Plan: 1. 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             -patient may shower             -ELOS/Goals: 10-14 days 2.  Antithrombotics: -DVT/anticoagulation:  Pharmaceutical: Eliquis              -antiplatelet therapy: none 3. Pain Management: Tylenol as needed 4. Mood/Behavior/Sleep: LCSW to evaluate and provide emotional support             -antipsychotic agents: n/a 5. Neuropsych/cognition: This patient is capable of making decisions on her own behalf. 6. Skin/Wound Care: routine skin care checks 7. Fluids/Electrolytes/Nutrition: strict Is and Os. 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 Not troponins unreliable d/t CKD.  13: ESRD on HD: Tues, Thurs, Sat schedule; nephrology following   Barbie Banner, PA-C 02/26/2022   I have examined the patient independently and edited the note for HPI, ROS, exam, assessment, and plan as appropriate. I am in agreement with the above recommendations.   Gertie Gowda, DO 02/26/2022

## 2022-02-26 NOTE — Progress Notes (Signed)
MD from rehab was in the patient's room, the patient started reporting bilateral lower chest pain with her heart rate in 120's. Let Dr. Doristine Bosworth know, obtained EKG and  patient laying in bed without distress. Will continue to monitor.

## 2022-02-26 NOTE — Progress Notes (Signed)
Inpatient Rehabilitation Admission Medication Review by a Pharmacist  A complete drug regimen review was completed for this patient to identify any potential clinically significant medication issues.  High Risk Drug Classes Is patient taking? Indication by Medication  Antipsychotic Yes Compazine- N/V  Anticoagulant Yes Apixaban- PAF adj for TBW / Scr  Antibiotic No   Opioid No   Antiplatelet No   Hypoglycemics/insulin No   Vasoactive Medication Yes Norvasc, Coreg, Catapres, Imdur- HTN  Chemotherapy No   Other Yes Crestor- HLD     Type of Medication Issue Identified Description of Issue Recommendation(s)  Drug Interaction(s) (clinically significant)     Duplicate Therapy     Allergy     No Medication Administration End Date     Incorrect Dose     Additional Drug Therapy Needed     Significant med changes from prior encounter (inform family/care partners about these prior to discharge).    Other       Clinically significant medication issues were identified that warrant physician communication and completion of prescribed/recommended actions by midnight of the next day:  No    Time spent performing this drug regimen review (minutes):  30   Aury Scollard BS, PharmD, BCPS Clinical Pharmacist 02/26/2022 6:54 PM  Contact: [phone] after 3 PM  "Be curious, not judgmental..." -Jamal Maes

## 2022-02-26 NOTE — Progress Notes (Signed)
PT Cancellation Note  Patient Details Name: Monique Henderson MRN: [id number] DOB: 12-19-1950   Cancelled Treatment:    Reason Eval/Treat Not Completed: Medical issues which prohibited therapy; Patient back from dialysis and MD from inpatient rehab in the room and concerned about pt c/o chest pain.  RN to get EKG.  Will cancel for this pm.    Reginia Naas 02/26/2022, 4:03 PM Magda Kiel, PT Acute Rehabilitation Services [phone] 02/26/2022

## 2022-02-26 NOTE — Progress Notes (Signed)
EKG CRITICAL VALUE     12 lead EKG performed.  Critical value noted.  Sherle Poe RN notified.  [place], CCT 02/26/2022 4:02 PM

## 2022-02-26 NOTE — TOC Transition Note (Signed)
Transition of Care [facility]) - CM/SW Discharge Note   Patient Details  Name: Monique Henderson MRN: [id number] Date of Birth: 05-Feb-1951  [facility] of [facility] CM/SW Contact:  Pollie Friar, RN Phone Number: 02/26/2022, 1:26 PM   Clinical Narrative:    Pt is discharging to CIR today. CM signing off.    Final next level of care: IP Rehab Facility Barriers to Discharge: No Barriers Identified   Patient Goals and CMS Choice   CMS Medicare.gov Compare Post Acute Care list provided to:: Patient Choice offered to / list presented to : Patient  Discharge Placement                       Discharge Plan and Services                                     Social Determinants of Health (SDOH) Interventions     Readmission Risk Interventions    01/09/2022    4:28 PM  Readmission Risk Prevention Plan  Transportation Screening Complete  Medication Review ([place]) Complete  PCP or Specialist appointment within 3-5 days of discharge Complete  HRI or [place] Complete  SW Recovery Care/Counseling Consult Complete  Palliative Care Screening Complete  [place] Not Applicable

## 2022-02-26 NOTE — Progress Notes (Signed)
PMR Admission Coordinator Pre-Admission Assessment   Patient: Monique Henderson is an 71 y.o., female MRN: [id number] DOB: August 16, 1950 Height: 5'4" Weight: 45.7 kg   Insurance Information HMO:  yes    PPO:      PCP:      IPA:      80/20:      OTHER:  PRIMARY: UHC Medicare       Policy#: [id number]      Subscriber: Pt.  CM Name:       Phone#: [phone]     Fax#: [phone] Pre-Cert#: [id number] approved for 14 days from 9/28 to 03/11/22 and update due 03/11/22     Employer:  Benefits:  Phone #: [phone]     Name: Checked through [facility] provider portal Eff Date: 01/30/2022 - still active Deductible: does not have one OOP Max: $8,300 [phone] met) CIR: $1,556/ admission copay SNF: $0.00 Copayment per day for days 1-20; $200 Copayment per day for days 21-100; Maximum of 100 days/benefit period Outpatient: 80% coverage; 20% co-insurance Home Health:  100% coverage, limited by medical necessity DME: 80% coverage; 20% co-insurance Providers: in network   SECONDARY: Medicaid       Policy#: [id number]      Phone#:    Financial Counselor:       Phone#:    The "Data Collection Information Summary" for patients in Inpatient Rehabilitation Facilities with attached "Privacy Act [place] Records" was provided and verbally reviewed with: Patient   Emergency Contact Information Contact Information       Name Relation Home Work Mobile    Obert Son     [phone]    Fox,Lurlene Sister     [phone]    Teti,Steve Significant other     [phone]           Current Medical History  Patient Admitting Diagnosis: L CVA   History of Present Illness: Monique Henderson is a 71 y.o. female with medical history significant of ESRD on HD TTS, PAF on Eliquis, aortic dissection status postrepair, HFrEF, refractory HTN, presented with strokelike symptoms 02/23/22. MRI revealing for Acute infarcts in left posterior frontal and parietal lobe, left caudate head, left caudate  tail, occipital lobe and frontal lobe. Neurology  feels that it etiology is  likely cardioembolic in setting of atrial fibrillation on anticoagulation. MRA head and neck showed no LVO or significant stenosis. Pt. Seen by PT/OT/SLP who recommended CIR to assist return to PLOF.    Complete NIHSS TOTAL: 8   Patient's medical record from [facility] has been reviewed by the rehabilitation admission coordinator and physician.   Past Medical History      Past Medical History:  Diagnosis Date   AF (paroxysmal atrial fibrillation) ([place]) 05/29/2019    on Coumadin   Aortic atherosclerosis ([place]) 07/05/2019   Aortic dissection ([place]) 04/04/2019    s/p repair   Bone spur 2008    Right calcaneal foot spur   Breast cancer ([place]) 2004    Ductal carcinoma in situ of the left breast; S/P left partial mastectomy 02/26/2003; S/P re-excision of cranial and lateral margins11/18/2004.radiation   Cerebral thrombosis with cerebral infarction 05/22/2019   Chronic low back pain 06/22/2016   Chronic obstructive lung disease ([place]) 01/16/2017   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 Autumn Messing; S/P re-excision of superior and inferior margins 10/27/2012.    ESRD on hemodialysis ([place]) 05/29/2019   Essential hypertension 09/16/2006  GERD 09/16/2006   Hepatitis C      treated and RNA confirmed not detectable 01/2017   Insomnia 03/14/2015   Malnutrition of moderate degree 05/19/2019   Non compliance with medical treatment 12/04/2017   Normocytic anemia      With thrombocytosis   Osteoarthritis     Right ureteral stone 2002   S/P lumbar spinal fusion 01/18/2014    S/P lumbar decompressive laminectomy, fusion, and plating for lumbar spinal stenosis on 05/27/2009 by Dr. Eustace Moore.  S/P anterolateral retroperitoneal interbody fusion L2-3 utilizing a 8 mm peek interbody cage packed with morcellized allograft, and anterior lumbar plating  L2-3 for recurrent disc herniation L2-3 with spinal stenosis on 01/18/2014 by Dr. Eustace Moore.     Tobacco use disorder 04/19/2009   Uterine fibroid     Wears dentures      top      Has the patient had major surgery during 100 days prior to admission? No   Family History   family history includes Bone cancer (age of onset: 72) in her sister; Colon cancer (age of onset: 50) in her mother; Diabetes in her brother, brother, and son; Diabetes (age of onset: 42) in her sister; Hypertension in her brother, brother, mother, sister, and son; Kidney disease in her son; Multiple sclerosis in her son.   Current Medications   Current Facility-Administered Medications:    acetaminophen (TYLENOL) tablet 650 mg, 650 mg, Oral, Q4H PRN, 650 mg at 02/24/22 2319 **OR** acetaminophen (TYLENOL) 160 MG/5ML solution 650 mg, 650 mg, Per Tube, Q4H PRN **OR** acetaminophen (TYLENOL) suppository 650 mg, 650 mg, Rectal, Q4H PRN, Wynetta Fines T, MD   albuterol (PROVENTIL) (2.5 MG/3ML) 0.083% nebulizer solution 2.5 mg, 2.5 mg, Nebulization, Q6H PRN, [place], Ping T, MD   amLODipine (NORVASC) tablet 10 mg, 10 mg, Oral, Daily, Pahwani, Ravi, MD, 10 mg at 02/24/22 1028   apixaban (ELIQUIS) tablet 2.5 mg, 2.5 mg, Oral, BID, Pahwani, Ravi, MD, 2.5 mg at 02/24/22 2135   carvedilol (COREG) tablet 6.25 mg, 6.25 mg, Oral, BID WC, Pahwani, Ravi, MD, 6.25 mg at 02/24/22 1736   Chlorhexidine Gluconate Cloth 2 % PADS 6 each, 6 each, Topical, Daily, Pahwani, Ravi, MD, 6 each at 02/24/22 1028   [START ON 02/28/2022] cloNIDine (CATAPRES - Dosed in mg/24 hr) patch 0.1 mg, 0.1 mg, Transdermal, Q Sat, Zhang, Ping T, MD   doxercalciferol (HECTOROL) injection 4 mcg, 4 mcg, Intravenous, Q T,Th,Sa-HD, Valentina Gu, NP, 4 mcg at 02/24/22 1624   hydrALAZINE (APRESOLINE) injection 10 mg, 10 mg, Intravenous, Q6H PRN, Shalhoub, Sherryll Burger, MD   hydrALAZINE (APRESOLINE) injection 20 mg, 20 mg, Intravenous, Q6H, Pahwani, Ravi, MD, 20 mg at  02/25/22 1219   isosorbide mononitrate (IMDUR) 24 hr tablet 30 mg, 30 mg, Oral, Daily, Pahwani, Ravi, MD, 30 mg at 02/24/22 1736   labetalol (NORMODYNE) injection 10 mg, 10 mg, Intravenous, Q2H PRN, Pahwani, Ravi, MD   LORazepam (ATIVAN) injection 0.5 mg, 0.5 mg, Intravenous, Q4H PRN, [place], Ping T, MD, 0.5 mg at 02/25/22 0116   rosuvastatin (CRESTOR) tablet 10 mg, 10 mg, Oral, Daily, Pahwani, Ravi, MD, 10 mg at 02/24/22 1028   Patients Current Diet:  Diet Order                  DIET DYS 2 Room service appropriate? Yes; Fluid consistency: Thin  Diet effective now  Precautions / Restrictions Precautions Precautions: Fall Precaution Comments: R UE hemiparesis Restrictions Weight Bearing Restrictions: No    Has the patient had 2 or more falls or a fall with injury in the past year? No   Prior Activity Level  Pt. Was active in the community PTA    Prior Functional Level Self Care: Did the patient need help bathing, dressing, using the toilet or eating? Independent   Indoor Mobility: Did the patient need assistance with walking from room to room (with or without device)? Independent   Stairs: Did the patient need assistance with internal or external stairs (with or without device)? Independent   Functional Cognition: Did the patient need help planning regular tasks such as shopping or remembering to take medications? Independent   Patient Information Are you of Hispanic, Latino/a,or Spanish origin?: X. Patient unable to respond What is your race?: B. Black or African American (answer from proxy) Do you need or want an interpreter to communicate with a doctor or health care staff?: 0. No   Patient's Response To:  Health Literacy and Transportation Is the patient able to respond to health literacy and transportation needs?: No Health Literacy - How often do you need to have someone help you when you read instructions, pamphlets, or other written  material from your doctor or pharmacy?: Never (answered by proxy) In the past 12 months, has lack of transportation kept you from medical appointments or from getting medications?: No In the past 12 months, has lack of transportation kept you from meetings, work, or from getting things needed for daily living?: No   Development worker, international aid / [place] Devices/Equipment: None Home Equipment: Conservation officer, nature (2 wheels), [employer] - single point, BSC/3in1   Prior Device Use: Indicate devices/aids used by the patient prior to current illness, exacerbation or injury? None of the above   Current Functional Level Cognition   Arousal/Alertness: Awake/alert Overall Cognitive Status: Impaired/Different from baseline Orientation Level: Oriented to person, Oriented to situation Following Commands: Follows one step commands consistently, Follows one step commands with increased time, Follows multi-step commands inconsistently Safety/Judgement: Decreased awareness of deficits, Decreased awareness of safety General Comments: pt oriented, follows simple commands with increased time.  Decreased awareness to deficits and safety.  Pt reports the bed pad is wet, but not her underwear initally; then once changed the bed pad reports she needed to take off her underewar. Attention: Focused Focused Attention: Appears intact Memory:  (TBA) Awareness: Appears intact Problem Solving:  (TBA) Safety/Judgment: Impaired    Extremity Assessment (includes Sensation/Coordination)   Upper Extremity Assessment: RUE deficits/detail RUE Deficits / Details: grossly 3-/5 shoulder elevation and retraction, 2/5 shoulder flexion and elbow flexion, 0/5 forearm/hand RUE Sensation: decreased proprioception, decreased light touch RUE Coordination: decreased gross motor, decreased fine motor  Lower Extremity Assessment: RLE deficits/detail RLE Deficits / Details: Mildly decreased strength compared to L. MMT difficult due to  pt slow to follow commands.     ADLs   Overall ADL's : Needs assistance/impaired Grooming: Minimal assistance, Sitting Lower Body Bathing: Minimal assistance, Sit to/from stand Upper Body Dressing : Minimal assistance, Sitting Lower Body Dressing: Moderate assistance, Sit to/from stand Toilet Transfer: Minimal assistance, Ambulation Toilet Transfer Details (indicate cue type and reason): simulated at [place] and Hygiene: Moderate assistance, Sit to/from stand Toileting - Clothing Manipulation Details (indicate cue type and reason): for clothing mgmt on R side, min assist dynamically when completing hygiene due to purewick leaking Functional mobility during ADLs: Minimal assistance,  Cueing for safety General ADL Comments: pt limited by R UE hemiparesis, cognition, balance     Mobility   Overal bed mobility: Needs Assistance Bed Mobility: Supine to Sit, Sit to Supine Supine to sit: Min assist Sit to supine: Min assist General bed mobility comments: Pt attempting to manage her RUE, however unable to completely keep in appropriate position. Assist provided for full RUE management, as well as for trunk support as pt scooted out to EOB.     Transfers   Overall transfer level: Needs assistance Equipment used: None Transfers: Sit to/from Stand Sit to Stand: Min assist General transfer comment: to power up and steady     Ambulation / Gait / Stairs / Emergency planning/management officer   Ambulation/Gait Ambulation/Gait assistance: Herbalist (Feet): 200 Feet Assistive device: 1 person hand held assist Gait Pattern/deviations: Step-through pattern, Decreased stride length, Antalgic, Drifts right/left, Narrow base of support General Gait Details: HHA for balance support and safety. Therapist guiding pt in hall by [facility] and gait belt due to drifting. Grossly unsteady, requiring min assist throughout. Gait velocity: Decreased Gait velocity interpretation: <1.8 ft/sec,  indicate of risk for recurrent falls     Posture / Balance Balance Overall balance assessment: Needs assistance Sitting-balance support: No upper extremity supported, Feet supported Sitting balance-Leahy Scale: Fair Standing balance support: During functional activity, No upper extremity supported Standing balance-Leahy Scale: Poor Standing balance comment: relies on external support High Level Balance Comments: Pt tolerated head turns (horizontal and vertical) while ambulating fairly well with only minimal LOB     Special needs/care consideration Hemodialysis T-Th-Sat schedule    Previous Home Environment (from acute therapy documentation) Living Arrangements: Non-relatives/Friends Available Help at Discharge: Friend(s) Type of Home: Apartment Home Layout: One level Home Access: Other (comment) (chair lift to her apartment?) Bathroom Shower/Tub: Walk-in shower, Sponge bathes at baseline Biochemist, clinical: [place]: No Additional Comments: difficult to understand at times- I think this is correct   Discharge Living Setting Plans for Discharge Living Setting: House Type of Home at Discharge: Apartment Discharge Home Layout: One level Discharge Bathroom Shower/Tub: Walk-in shower Discharge Bathroom Toilet: Standard Discharge Bathroom Accessibility: No Does the patient have any problems obtaining your medications?: No   Social/Family/Support Systems Patient Roles: Other (Comment) Contact Information: [phone] Anticipated Caregiver: [name] (son) Anticipated Caregiver's Contact Information: [phone] Ability/Limitations of Caregiver: Son and sisters to rotate Caregiver Availability: 24/7 Discharge Plan Discussed with Primary Caregiver: No Is Caregiver In Agreement with Plan?: No Does Caregiver/Family have Issues with Lodging/Transportation while Pt is in Rehab?: No   Goals Patient/Family Goal for Rehab: PT/OT Mod I, SLP Min A Expected length of stay:  7-10 days Pt/Family Agrees to Admission and willing to participate: Yes Program Orientation Provided & Reviewed with Pt/Caregiver Including Roles  & Responsibilities: No   Decrease burden of Care through IP rehab admission: none    Possible need for SNF placement upon discharge: not anticipated    Patient Condition: I have reviewed medical records from [facility], spoken with CM, and patient. I met with patient at the bedside for inpatient rehabilitation assessment.  Patient will benefit from ongoing PT, OT, and SLP, can actively participate in 3 hours of therapy a day 5 days of the week, and can make measurable gains during the admission.  Patient will also benefit from the coordinated team approach during an Inpatient Acute Rehabilitation admission.  The patient will receive intensive therapy as well as Rehabilitation physician, nursing, social worker, and  care management interventions.  Due to safety, skin/wound care, disease management, medication administration, pain management, and patient education the patient requires 24 hour a day rehabilitation nursing.  The patient is currently min A with mobility and basic ADLs.  Discharge setting and therapy post discharge at home with home health is anticipated.  Patient has agreed to participate in the Acute Inpatient Rehabilitation Program and will admit today.   Preadmission Screen Completed By:  Retta Diones, 02/26/2022 1:37 PM ______________________________________________________________________   Discussed status with Dr. Naaman Plummer on 02/26/22 at 0945 and received approval for admission today.   Admission Coordinator:  Retta Diones, RN, time 1338/Date 02/26/22    Assessment/Plan: Diagnosis: Does the need for close, 24 hr/day Medical supervision in concert with the patient's rehab needs make it unreasonable for this patient to be served in a less intensive setting? Yes Co-Morbidities requiring supervision/potential  complications: ESRD on HD, dCHF, HTN, PAF on Eliquis; dysphagia; dysarthria; R hemiparesis Due to bladder management, bowel management, safety, skin/wound care, disease management, medication administration, and patient education, does the patient require 24 hr/day rehab nursing? Yes Does the patient require coordinated care of a physician, rehab nurse, PT, OT, and SLP to address physical and functional deficits in the context of the above medical diagnosis(es)? Yes Addressing deficits in the following areas: balance, endurance, locomotion, strength, transferring, bowel/bladder control, bathing, dressing, feeding, grooming, toileting, cognition, language, and swallowing Can the patient actively participate in an intensive therapy program of at least 3 hrs of therapy 5 days a week? Yes The potential for patient to make measurable gains while on inpatient rehab is good Anticipated functional outcomes upon discharge from inpatient rehab: modified independent PT, modified independent OT, modified independent and supervision SLP Estimated rehab length of stay to reach the above functional goals is: 7-10 days Anticipated discharge destination: Home 10. Overall Rehab/Functional Prognosis: good     MD Signature:           Revision History                                                    Note Details  Jan Fireman, MD File Time 02/26/2022  2:01 PM  Author Type Physician Status Signed  Last Editor Courtney Heys, MD Service CASE MANAGEMENT

## 2022-02-26 NOTE — Progress Notes (Signed)
Pt receives out-pt HD at [facility] on TTS. Will assist as needed.   Melven Sartorius Renal Navigator [phone]

## 2022-02-26 NOTE — Progress Notes (Signed)
IP rehab admissions - I have approval for acute inpatient rehab admission for today from [facility] carrier.  Will check with MD about medical readiness for admit today.  I do have a bed on CIR available today.  Call me for questions.  [phone]

## 2022-02-27 LAB — COMPREHENSIVE METABOLIC PANEL
ALT: 12 U/L (ref 0–44)
AST: 21 U/L (ref 15–41)
Albumin: 4.1 g/dL (ref 3.5–5.0)
Alkaline Phosphatase: 69 U/L (ref 38–126)
Anion gap: 15 (ref 5–15)
BUN: 37 mg/dL — ABNORMAL HIGH (ref 8–23)
CO2: 26 mmol/L (ref 22–32)
Calcium: 11.1 mg/dL — ABNORMAL HIGH (ref 8.9–10.3)
Chloride: 92 mmol/L — ABNORMAL LOW (ref 98–111)
Creatinine, Ser: 7.92 mg/dL — ABNORMAL HIGH (ref 0.44–1.00)
GFR, Estimated: 5 mL/min — ABNORMAL LOW (ref >60)
Glucose, Bld: 113 mg/dL — ABNORMAL HIGH (ref 70–99)
Potassium: 4.2 mmol/L (ref 3.5–5.1)
Sodium: 133 mmol/L — ABNORMAL LOW (ref 135–145)
Total Bilirubin: 0.4 mg/dL (ref 0.3–1.2)
Total Protein: 7.8 g/dL (ref 6.5–8.1)

## 2022-02-27 LAB — CBC WITH DIFFERENTIAL/PLATELET
Abs Immature Granulocytes: 0 10*3/uL (ref 0.00–0.07)
Basophils Absolute: 0.1 10*3/uL (ref 0.0–0.1)
Basophils Relative: 2 %
Eosinophils Absolute: 0.1 10*3/uL (ref 0.0–0.5)
Eosinophils Relative: 4 %
HCT: 42.6 % (ref 36.0–46.0)
Hemoglobin: 13.4 g/dL (ref 12.0–15.0)
Immature Granulocytes: 0 %
Lymphocytes Relative: 29 %
Lymphs Abs: 1.1 10*3/uL (ref 0.7–4.0)
MCH: 30.9 pg (ref 26.0–34.0)
MCHC: 31.5 g/dL (ref 30.0–36.0)
MCV: 98.2 fL (ref 80.0–100.0)
Monocytes Absolute: 0.8 10*3/uL (ref 0.1–1.0)
Monocytes Relative: 21 %
Neutro Abs: 1.6 10*3/uL — ABNORMAL LOW (ref 1.7–7.7)
Neutrophils Relative %: 44 %
Platelets: 282 10*3/uL (ref 150–400)
RBC: 4.34 MIL/uL (ref 3.87–5.11)
RDW: 15.8 % — ABNORMAL HIGH (ref 11.5–15.5)
WBC: 3.6 10*3/uL — ABNORMAL LOW (ref 4.0–10.5)
nRBC: 0 % (ref 0.0–0.2)

## 2022-02-27 LAB — PHOSPHORUS: Phosphorus: 7.3 mg/dL — ABNORMAL HIGH (ref 2.5–4.6)

## 2022-02-27 LAB — MAGNESIUM: Magnesium: 2.2 mg/dL (ref 1.7–2.4)

## 2022-02-27 MED ORDER — CHLORHEXIDINE GLUCONATE CLOTH 2 % EX PADS
6.0000 | MEDICATED_PAD | Freq: Every day | CUTANEOUS | Status: DC
  Administered 2022-03-01 – 2022-03-02 (×2): 6 via TOPICAL

## 2022-02-27 MED ORDER — LIDOCAINE 5 % EX PTCH
1.0000 | MEDICATED_PATCH | CUTANEOUS | Status: DC
  Administered 2022-02-27 – 2022-03-01 (×3): 1 via TRANSDERMAL
  Filled 2022-02-27 (×6): qty 1

## 2022-02-27 NOTE — Progress Notes (Signed)
Inpatient Rehabilitation  Patient information reviewed and entered into eRehab system by Baylin Cabal M. Abisai Coble, M.A., CCC/SLP, PPS Coordinator.  Information including medical coding, functional ability and quality indicators will be reviewed and updated through discharge.    

## 2022-02-27 NOTE — Evaluation (Signed)
Speech Language Pathology Assessment and Plan  Patient Details  Name: Monique [facility] MRN: [id number] Date of Birth: [birth year]  SLP Diagnosis: Dysarthria;Speech and Language deficits;Cognitive Impairments;Dysphagia  Rehab Potential: Good ELOS: 7-10 days   Today's Date: 02/27/2022 SLP Individual Time: 8676-1950 SLP Individual Time Calculation (min): 41 min  [facility] Problem: Principal Problem:   Acute cardioembolic stroke Monique [facility])  Past Medical History:  Past Medical History:  Diagnosis Date   AF (paroxysmal atrial fibrillation) ([place]) 05/29/2019   on Coumadin   Aortic atherosclerosis ([place]) 07/05/2019   Aortic dissection ([place]) 04/04/2019   s/p repair   Bone spur 2008   Right calcaneal foot spur   Breast cancer ([place]) 2004   Ductal carcinoma in situ of the left breast; S/P left partial mastectomy 02/26/2003; S/P re-excision of cranial and lateral margins11/18/2004.radiation   Cerebral thrombosis with cerebral infarction 05/22/2019   Chronic low back pain 06/22/2016   Chronic obstructive lung disease ([place]) 01/16/2017   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 Autumn [facility]; S/P re-excision of superior and inferior margins 10/27/2012.    ESRD on hemodialysis ([place]) 05/29/2019   Essential hypertension 09/16/2006   GERD 09/16/2006   Hepatitis C    treated and RNA confirmed not detectable 01/2017   Insomnia 03/14/2015   Malnutrition of moderate degree 05/19/2019   Non compliance with medical treatment 12/04/2017   Normocytic anemia    With thrombocytosis   Osteoarthritis    Right ureteral stone 2002   S/P lumbar spinal fusion 01/18/2014   S/P lumbar decompressive laminectomy, fusion, and plating for lumbar spinal stenosis on 05/27/2009 by Dr. Eustace [facility].  S/P anterolateral retroperitoneal interbody fusion L2-3 utilizing a 8 mm peek interbody cage packed with morcellized allograft, and anterior lumbar plating L2-3 for  recurrent disc herniation L2-3 with spinal stenosis on 01/18/2014 by Dr. Eustace [facility].     Tobacco use disorder 04/19/2009   Uterine fibroid    Wears dentures    top   Past Surgical History:  Past Surgical History:  Procedure Laterality Date   ANTERIOR LAT LUMBAR FUSION N/A 01/18/2014   Procedure: ANTERIOR LATERAL LUMBAR FUSION LUMBAR TWO-THREE;  Surgeon: Monique Moore, MD;  Location: [facility];  Service: Neurosurgery;  Laterality: N/A;   ANTERIOR LUMBAR FUSION  01/18/2014   AV FISTULA PLACEMENT Left 04/20/2019   Procedure: ARTERIOVENOUS (AV) FISTULA CREATION;  Surgeon: Monique Sandy, MD;  Location: [facility];  Service: Vascular;  Laterality: Left;   BACK SURGERY     BREAST LUMPECTOMY Left 01/2003   BREAST LUMPECTOMY Right 2014   BREAST LUMPECTOMY WITH NEEDLE LOCALIZATION AND AXILLARY SENTINEL LYMPH NODE BX Right 10/13/2012   Procedure: BREAST LUMPECTOMY WITH NEEDLE LOCALIZATION;  Surgeon: Monique Roof, MD;  Location: [facility];  Service: General;  Laterality: Right;  Right breast wire localized lumpectomy   INSERTION OF DIALYSIS CATHETER Right 04/20/2019   Procedure: INSERTION OF DIALYSIS CATHETER, right internal jugular;  Surgeon: Monique Sandy, MD;  Location: [facility];  Service: Vascular;  Laterality: Right;   INSERTION OF DIALYSIS CATHETER Right 09/24/2020   Procedure: INSERTION OF TUNNELED DIALYSIS CATHETER;  Surgeon: Monique Posner, MD;  Location: [facility];  Service: Vascular;  Laterality: Right;   IR FLUORO GUIDE CV LINE RIGHT  09/22/2020   IR THORACENTESIS ASP PLEURAL SPACE W/IMG GUIDE  05/19/2019   IR US GUIDE VASC ACCESS LEFT  09/22/2020   IR US GUIDE  VASC ACCESS RIGHT  09/22/2020   IR VENOCAVAGRAM SVC  09/22/2020   LAMINECTOMY  05/27/2009   Lumbar decompressive laminectomy, fusion and plating for lumbar spinal stensosis   LIGATION OF ARTERIOVENOUS  FISTULA Left 09/24/2020   Procedure: LIGATION OF LEFT ARM ARTERIOVENOUS  FISTULA;  Surgeon: Monique Posner, MD;  Location: [facility];  Service: Vascular;  Laterality: Left;   LUMBAR LAMINECTOMY/DECOMPRESSION MICRODISCECTOMY Left 03/23/2013   Procedure: LUMBAR LAMINECTOMY/DECOMPRESSION MICRODISCECTOMY 1 LEVEL;  Surgeon: Monique Moore, MD;  Location: [facility];  Service: Neurosurgery;  Laterality: Left;  LUMBAR LAMINECTOMY/DECOMPRESSION MICRODISCECTOMY 1 LEVEL   MASTECTOMY, PARTIAL Left 02/26/2003   ; S/P re-excision of cranial and lateral margins 04/19/2003.    RE-EXCISION OF BREAST CANCER,SUPERIOR MARGINS Right 10/27/2012   Procedure: RE-EXCISION OF BREAST CANCER,SUPERIOR and inferior MARGINS;  Surgeon: Monique Roof, MD;  Location: [facility];  Service: General;  Laterality: Right;   RE-EXCISION OF BREAST LUMPECTOMY Left 04/2003   TEE WITHOUT CARDIOVERSION N/A 04/04/2019   Procedure: Transesophageal Echocardiogram ([facility]);  Surgeon: Monique Olds, MD;  Location: [facility];  Service: Open Heart Surgery;  Laterality: N/A;   THORACIC AORTIC ANEURYSM REPAIR N/A 04/04/2019   Procedure: THORACIC ASCENDING ANEURYSM REPAIR (AAA)  USING 28 MM X 30 CM HEMASHIELD PLATINUM VASCULAR GRAFT;  Surgeon: Monique Olds, MD;  Location: [facility];  Service: Open Heart Surgery;  Laterality: N/A;    Assessment / Plan / Recommendation Clinical Impression Monique [facility] is a 71 year old female who developed sudden onset of facial droop, slurred speech and right-sided weakness on the morning of 02/22/2022.  She [facility] to [facility] emergency department via EMS 9/25 and code stroke was activated. Cup included MRI of the brain which revealed acute/subacute cortical infarct of the left posterior frontal and anterior parietal lobes including the central sulcus and additional focal acute/subacute infarct involving the caudate head. She has a history of paroxysmal atrial fibrillation on Eliquis. She is tolerating dysphagia 2 diet with thin liquids. The patient requires inpatient physical medicine and rehabilitation evaluations and  treatment secondary to dysfunction due to left posterior frontal and anterior parietal infarcts.  Past medical history includes thoracic aortic aneurysm repair, anterior lumbar fusion, breast cancer.   SLP consulted to complete speech-language, cognitive-linguistic, and clinical swallow evaluation (CSE) in the setting of recent acute to subacute left posterior frontal and anterior parietal infarcts. Pt presents with mild-to-moderate cognitive-linguistic deficits (in the domains of short-term recall, problem solving, calculations) as evident by achieving a score of 13/30 on SLUMS (n = 25).   Pt presents with a severe dysarthria as evident by articulatory imprecision, reduced vocal intensity, and fast rate of speech. As a result of these functional deficits, pt required overall mod A verbal cues for cognitive task completion, and max A verbal cues for implementation of speech strategies at the word level.   BSE was remarkable for prolonged mastication, decreased oral control, decreased bolus cohesion, anterior spillage on R with poor awareness to bolus, buccal stasis on R, mild oral residuals, and delayed cough x1 with impulsive and large bite. SLP recommends dysphagia 2 diet, crushed medications, and full supervision due to impulsive nature.   Pt completed the Western Aphasia Battery (WAB-Bedside) and scored WFL in all domains. Expressive/receptive language skills appear grossly intact.   Skilled ST intervention is recommended to address cognitive-linguistic, speech, swallow skills and functional independence. Pt and sister verbalized understanding and agreement with plan.    Skilled Therapeutic Interventions  CSE, SLUMS and informal assessment measures administered. Please see report for full details.   SLP Assessment  Patient will need skilled Speech Lanaguage Pathology Services during CIR admission    Recommendations  SLP Diet Recommendations: Dysphagia 2 (Fine chop);Thin Liquid  Administration via: Cup;Straw Medication Administration: Crushed with puree Supervision: Full supervision/cueing for compensatory strategies Compensations: Small sips/bites;Slow rate;Minimize environmental distractions;Monitor for anterior loss Postural Changes and/or Swallow Maneuvers: Seated upright 90 degrees Oral Care Recommendations: Oral care BID Patient destination: Home Follow up Recommendations: 24 hour supervision/assistance;Home Health SLP;Outpatient SLP Equipment Recommended: None recommended by SLP    SLP Frequency 3 to 5 out of 7 days   SLP Duration  SLP Intensity  SLP Treatment/Interventions 7-10 days  Minumum of 1-2 x/day, 30 to 90 minutes  Cognitive remediation/compensation;Functional tasks;Dysphagia/aspiration precaution training;Internal/external aids;Multimodal communication approach;Oral motor exercises;Patient/family education;Speech/Language facilitation;Therapeutic Activities;Therapeutic Exercise    Pain    Prior Functioning Cognitive/Linguistic Baseline: Within functional limits Type of Home: Apartment  Lives With: Alone Available Help at Discharge: Family (3 sisters and a son to support at home) Education: 11th  SLP Evaluation Cognition Overall Cognitive Status: Impaired/Different from baseline Arousal/Alertness: Awake/alert Orientation Level: Oriented X4 Year: 2022 Month: September Day of Week: Correct Attention: Focused;Sustained;Selective Focused Attention: Appears intact Sustained Attention: Appears intact Selective Attention: Appears intact Memory: Impaired Memory Impairment: Storage deficit;Decreased recall of new information Awareness: Appears intact Problem Solving: Impaired Problem Solving Impairment: Verbal complex Behaviors: Impulsive Safety/Judgment: Impaired  Comprehension Auditory Comprehension Overall Auditory Comprehension: Appears within functional limits for tasks assessed Expression Expression Primary Mode of  Expression: Verbal Verbal Expression Overall Verbal Expression: Appears within functional limits for tasks assessed Interfering Components: Speech intelligibility Oral Motor Oral Motor/Sensory Function Overall Oral Motor/Sensory Function: Moderate impairment Facial ROM: Reduced right;Suspected CN VII (facial) dysfunction Facial Symmetry: Abnormal symmetry right;Suspected CN VII (facial) dysfunction Facial Strength: Reduced right;Suspected CN VII (facial) dysfunction Facial Sensation: Reduced right Lingual ROM: Reduced right;Reduced left;Suspected CN XII (hypoglossal) dysfunction Lingual Symmetry: Within Functional Limits Lingual Strength: Reduced;Suspected CN XII (hypoglossal) dysfunction Motor Speech Overall Motor Speech: Impaired Respiration: Impaired Level of Impairment: Phrase Phonation: Low vocal intensity Resonance: Within functional limits Articulation: Impaired Level of Impairment: Word Intelligibility: Intelligibility reduced Word: 25-49% accurate Phrase: 25-49% accurate Sentence: 0-24% accurate Conversation: Not tested Motor Planning: Not tested Motor Speech Errors: Aware Effective Techniques: Increased vocal intensity;Over-articulate;Slow rate  Care Tool Care Tool Cognition Ability to hear (with hearing aid or hearing appliances if normally used Ability to hear (with hearing aid or hearing appliances if normally used): 1. Minimal difficulty - difficulty in some environments (e.g. when person speaks softly or setting is noisy)   Expression of Ideas and Wants Expression of Ideas and Wants: 2. Frequent difficulty - frequently exhibits difficulty with expressing needs and ideas   Understanding Verbal and Non-Verbal Content Understanding Verbal and Non-Verbal Content: 3. Usually understands - understands most conversations, but misses some part/intent of message. Requires cues at times to understand  Memory/Recall Ability Memory/Recall Ability : Current season;That he or  she is in a [facility] unit   PMSV Assessment  PMSV Trial Intelligibility: Intelligibility reduced Word: 25-49% accurate Phrase: 25-49% accurate Sentence: 0-24% accurate Conversation: Not tested  Bedside Swallowing Assessment General Temperature Spikes Noted: No Respiratory Status: Room air History of Recent Intubation: No Behavior/Cognition: Alert;Cooperative;Pleasant mood Oral Cavity - Dentition: Dentures, top;Missing dentition Self-Feeding Abilities: Needs set up;Needs assist Vision: Functional for self-feeding Patient Positioning: Upright in bed Baseline Vocal Quality: Normal Volitional Cough: Weak Volitional Swallow: Able to elicit  Oral  Care Assessment Oral Assessment  (WDL): Exceptions to WDL Lips: Asymmetrical Teeth: Missing (Comment);Dentures upper Tongue: Pink;Moist Mucous Membrane(s): Moist;Pink Saliva: Moist, saliva free flowing Level of Consciousness: Alert Is patient on any of following O2 devices?: None of the above Nutritional status: Dysphagia Oral Assessment Risk : High Risk Ice Chips Ice chips: Not tested Thin Liquid Thin Liquid: Impaired Presentation: Straw Oral Phase Impairments: Reduced labial seal;Reduced lingual movement/coordination Oral Phase Functional Implications: Right anterior spillage;Prolonged oral transit Pharyngeal  Phase Impairments: Multiple swallows;Suspected delayed Swallow Nectar Thick Nectar Thick Liquid: Not tested Honey Thick Honey Thick Liquid: Not tested Puree Puree: Impaired Presentation: Self Fed;Spoon Oral Phase Impairments: Reduced labial seal Oral Phase Functional Implications: Right anterior spillage;Oral residue Solid Solid: Impaired Presentation: Self Fed Oral Phase Impairments: Reduced labial seal;Impaired mastication;Reduced lingual movement/coordination Oral Phase Functional Implications: Right anterior spillage;Prolonged oral transit;Impaired mastication;Oral residue Pharyngeal Phase Impairments:  Cough - Delayed BSE Assessment Risk for Aspiration Impact on safety and function: Moderate aspiration risk Other Related Risk Factors: History of GERD;Other (comment) (impulsive with intake)  Short Term Goals: Week 1: SLP Short Term Goal 1 (Week 1): STG=LTG due to ELOS  Refer to Care Plan for Long Term Goals  Recommendations for other services: None   Discharge Criteria: Patient will be discharged from SLP if patient refuses treatment 3 consecutive times without medical reason, if treatment goals not met, if there is a change in medical status, if patient makes no progress towards goals or if patient is discharged from [facility].  The above assessment, treatment plan, treatment alternatives and goals were discussed and mutually agreed upon: by patient and by family  Patty Sermons 02/27/2022, 5:41 PM

## 2022-02-27 NOTE — Evaluation (Signed)
Physical Therapy Assessment and Plan  Patient Details  Name: BREANN LOSANO MRN: [id number] Date of Birth: 1951-03-04  PT Diagnosis: Abnormality of gait and Hemiparesis dominant Rehab Potential: Excellent ELOS: 7-10 days   Today's Date: 02/27/2022 PT Individual Time: 0845-1000 PT Individual Time Calculation (min): 75 min    Hospital Problem: Principal Problem:   Acute cardioembolic stroke [facility])   Past Medical History:  Past Medical History:  Diagnosis Date   AF (paroxysmal atrial fibrillation) ([place]) 05/29/2019   on Coumadin   Aortic atherosclerosis ([place]) 07/05/2019   Aortic dissection ([place]) 04/04/2019   s/p repair   Bone spur 2008   Right calcaneal foot spur   Breast cancer ([place]) 2004   Ductal carcinoma in situ of the left breast; S/P left partial mastectomy 02/26/2003; S/P re-excision of cranial and lateral margins11/18/2004.radiation   Cerebral thrombosis with cerebral infarction 05/22/2019   Chronic low back pain 06/22/2016   Chronic obstructive lung disease ([place]) 01/16/2017   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 Autumn Messing; S/P re-excision of superior and inferior margins 10/27/2012.    ESRD on hemodialysis ([place]) 05/29/2019   Essential hypertension 09/16/2006   GERD 09/16/2006   Hepatitis C    treated and RNA confirmed not detectable 01/2017   Insomnia 03/14/2015   Malnutrition of moderate degree 05/19/2019   Non compliance with medical treatment 12/04/2017   Normocytic anemia    With thrombocytosis   Osteoarthritis    Right ureteral stone 2002   S/P lumbar spinal fusion 01/18/2014   S/P lumbar decompressive laminectomy, fusion, and plating for lumbar spinal stenosis on 05/27/2009 by Dr. Eustace Moore.  S/P anterolateral retroperitoneal interbody fusion L2-3 utilizing a 8 mm peek interbody cage packed with morcellized allograft, and anterior lumbar plating L2-3 for recurrent disc herniation L2-3 with  spinal stenosis on 01/18/2014 by Dr. Eustace Moore.     Tobacco use disorder 04/19/2009   Uterine fibroid    Wears dentures    top   Past Surgical History:  Past Surgical History:  Procedure Laterality Date   ANTERIOR LAT LUMBAR FUSION N/A 01/18/2014   Procedure: ANTERIOR LATERAL LUMBAR FUSION LUMBAR TWO-THREE;  Surgeon: Eustace Moore, MD;  Location: [place] [facility]UR[facility]S;  Service: Neurosurgery;  Laterality: N/A;   ANTERIOR LUMBAR FUSION  01/18/2014   AV FISTULA PLACEMENT Left 04/20/2019   Procedure: ARTERIOVENOUS (AV) FISTULA CREATION;  Surgeon: [name], MD;  Location: [place];  Service: Vascular;  Laterality: Left;   BACK SURGERY     BREAST LUMPECTOMY Left 01/2003   BREAST LUMPECTOMY Right 2014   BREAST LUMPECTOMY WITH NEEDLE LOCALIZATION AND AXILLARY SENTINEL LYMPH NODE BX Right 10/13/2012   Procedure: BREAST LUMPECTOMY WITH NEEDLE LOCALIZATION;  Surgeon: [name], MD;  Location: [place];  Service: General;  Laterality: Right;  Right breast wire localized lumpectomy   INSERTION OF DIALYSIS CATHETER Right 04/20/2019   Procedure: INSERTION OF DIALYSIS CATHETER, right internal jugular;  Surgeon: [name], MD;  Location: [place];  Service: Vascular;  Laterality: Right;   INSERTION OF DIALYSIS CATHETER Right 09/24/2020   Procedure: INSERTION OF TUNNELED DIALYSIS CATHETER;  Surgeon: Rosetta Posner, MD;  [facility];  Service: Vascular;  Laterality: Right;   IR FLUORO GUIDE CV LINE RIGHT  09/22/2020   IR THORACENTESIS ASP PLEURAL SPACE W/IMG GUIDE  05/19/2019   IR US GUIDE VASC ACCESS LEFT  09/22/2020   IR  US GUIDE VASC ACCESS RIGHT  09/22/2020   IR VENOCAVAGRAM SVC  09/22/2020   LAMINECTOMY  05/27/2009   Lumbar decompressive laminectomy, fusion and plating for lumbar spinal stensosis   LIGATION OF ARTERIOVENOUS  FISTULA Left 09/24/2020   Procedure: LIGATION OF LEFT ARM ARTERIOVENOUS  FISTULA;  Surgeon: Rosetta Posner, MD;  Location: [place];  Service:  Vascular;  Laterality: Left;   LUMBAR LAMINECTOMY/DECOMPRESSION MICRODISCECTOMY Left 03/23/2013   Procedure: LUMBAR LAMINECTOMY/DECOMPRESSION MICRODISCECTOMY 1 LEVEL;  Surgeon: Eustace Moore, MD;  Location: [place] [facility];  Service: Neurosurgery;  Laterality: Left;  LUMBAR LAMINECTOMY/DECOMPRESSION MICRODISCECTOMY 1 LEVEL   MASTECTOMY, PARTIAL Left 02/26/2003   ; S/P re-excision of cranial and lateral margins 04/19/2003.    RE-EXCISION OF BREAST CANCER,SUPERIOR MARGINS Right 10/27/2012   Procedure: RE-EXCISION OF BREAST CANCER,SUPERIOR and inferior MARGINS;  Surgeon: [name], MD;  Location: [place];  Service: General;  Laterality: Right;   RE-EXCISION OF BREAST LUMPECTOMY Left 04/2003   TEE WITHOUT CARDIOVERSION N/A 04/04/2019   Procedure: Transesophageal Echocardiogram ([place]);  Surgeon: Wonda Olds, MD;  Location: [place];  Service: Open Heart Surgery;  Laterality: N/A;   THORACIC AORTIC ANEURYSM REPAIR N/A 04/04/2019   Procedure: THORACIC ASCENDING ANEURYSM REPAIR (AAA)  USING 28 MM X 30 CM HEMASHIELD PLATINUM VASCULAR GRAFT;  Surgeon: Wonda Olds, MD;  [facility];  Service: Open Heart Surgery;  Laterality: N/A;    Assessment & Plan Clinical Impression: Torah Pinnock is a 71 year old female who developed sudden onset of facial droop, slurred speech and right-sided weakness on the morning of 02/22/2022.  She presented to [facility] emergency department via EMS 9/25 and code stroke was activated.  She was seen in consultation by Dr. Leonie Man.  Cup included MRI of the brain which revealed acute/subacute cortical infarct of the left posterior frontal and anterior parietal lobes including the central sulcus and additional focal acute/subacute infarct involving the caudate head.  She has a history of paroxysmal atrial fibrillation on Eliquis.  MR angiogram of neck and brain without large vessel stenosis.  Concerned about possible fibromuscular dysplasia involving distal ICAs but no evidence  of hemodynamically significant stenoses.  Her history also includes end-stage renal disease on intermittent hemodialysis and nephrology was consulted.  She is tolerating dysphagia 2 diet with thin liquids. 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   Patient currently requires min with mobility secondary to decreased coordination and hemiplegia.  Prior to hospitalization, patient was independent  with mobility and lived with Other (Comment) in a [place] home.  Home access is  Other (comment).  Patient will benefit from skilled PT intervention to maximize safe functional mobility, minimize fall risk, and decrease caregiver burden for planned discharge home with 24 hour supervision.  Anticipate patient will benefit from follow up OP at discharge.  PT - End of Session Activity Tolerance: Tolerates 30+ min activity without fatigue Endurance Deficit: Yes Endurance Deficit Description: demonstrated fatigue with ADLs and pt requesting to go to bed after prior PT session and OT session PT Assessment Rehab Potential (ACUTE/IP ONLY): Excellent PT Patient demonstrates impairments in the following area(s): Balance;Safety;Motor PT Transfers Functional Problem(s): Bed Mobility;Bed to Chair;Car;Furniture;Floor PT Locomotion Functional Problem(s): Ambulation;Stairs PT Plan PT Intensity: Minimum of 1-2 x/day ,45 to 90 minutes PT Frequency: 5 out of 7 days PT Duration Estimated Length of Stay: 7-10 days PT Treatment/Interventions: Ambulation/gait training;Community reintegration;Neuromuscular re-education;Stair training;UE/LE  Strength taining/ROM;Therapeutic Activities;UE/LE Coordination activities;Therapeutic Exercise PT Transfers Anticipated Outcome(s): mod I PT Locomotion Anticipated Outcome(s): supervision w/o AD PT Recommendation Follow Up Recommendations: Outpatient  PT Patient destination: Home Equipment Recommended: To be determined Equipment Details: possible w/c for longer distances ??   PT Evaluation Precautions/Restrictions Precautions Precautions: Fall Precaution Comments: Min R LE hemiparesis Restrictions Weight Bearing Restrictions: No General Chart Reviewed: Yes Family/Caregiver Present: No Vital Signs  Pain Pain Assessment Pain Scale: 0-10 Pain Score: 0-No pain Pain Interference Pain Interference Pain Effect on Sleep: 0. Does not apply - I have not had any pain or hurting in the past 5 days Pain Interference with Therapy Activities: 0. Does not apply - I have not received rehabilitationtherapy in the past 5 days Pain Interference with Day-to-Day Activities: 1. Rarely or not at all Home Living/Prior [place] Available Help at Discharge: Family Type of Home: Apartment Home Access: Other (comment) Home Layout: One level Bathroom Shower/Tub: Walk-in shower;Sponge bathes at baseline Bathroom Toilet: Standard Bathroom Accessibility: Yes Additional Comments: able to gather info with yes/no questions, however would confirm with family to determine accuracy  Lives With: Other (Comment) Prior Function Level of Independence: Independent with transfers;Independent with gait  Able to Take Stairs?: Yes Driving: Yes Vision/Perception  Vision - History Ability to See in Adequate Light: 0 Adequate Vision - Assessment Eye Alignment: Within Functional Limits Ocular Range of Motion: Within Functional Limits Alignment/Gaze Preference: Within Defined Limits Tracking/Visual Pursuits: Decreased smoothness of vertical tracking Saccades: Within functional limits Perception Perception: Within Functional Limits Praxis Praxis: Intact  Cognition Overall Cognitive Status: Within Functional Limits for tasks assessed Arousal/Alertness: Awake/alert Memory: Appears intact Awareness: Appears intact Problem Solving: Appears  intact Safety/Judgment: Impaired Sensation Sensation Light Touch: Appears Intact Proprioception: Impaired by gross assessment (RUE) Additional Comments: pt reports no sensation changes in RUE however with mild deficits noted upon assessment Coordination Gross Motor Movements are Fluid and Coordinated: No Fine Motor Movements are Fluid and Coordinated: No Coordination and Movement Description: generalized incoordination due to hemiparesis/plegia, weakness and balance deficits Finger Nose Finger Test: unable to complete on RUE due to hemi-plegia, however dysmetria on LUE Heel Shin Test: impaired RLE. Motor  Motor Motor: Hemiplegia Motor - Skilled Clinical Observations: minimal R hemi LE   Trunk/Postural Assessment  Cervical Assessment Cervical Assessment: Within Functional Limits Thoracic Assessment Thoracic Assessment: Within Functional Limits Lumbar Assessment Lumbar Assessment: Within Functional Limits  Balance Balance Balance Assessed: Yes Dynamic Sitting Balance Dynamic Sitting - Balance Support: Feet supported Dynamic Sitting - Level of Assistance: 5: Stand by assistance Static Standing Balance Static Standing - Balance Support: No upper extremity supported Static Standing - Level of Assistance: 4: Min assist Extremity Assessment  RUE Assessment RUE Assessment: Exceptions to [facility] Passive Range of Motion (PROM) Comments: Limited shoulder flexion at 120, otherwise WFL distally Active Range of Motion (AROM) Comments: 90 degrees shoulder flexion, WFL elbow extension, otherwise impaired due to hemiplegia General Strength Comments: 3-/5 shoulder flexion, 4-/5 triceps, flaccid distally RUE Body System: Neuro Brunstrum levels for arm and hand: Hand Brunstrum level for hand: Stage I Flaccidity LUE Assessment LUE Assessment: Exceptions to [facility] Active Range of Motion (AROM) Comments: Limted shoulder flexion at 120 degrees, otherwise [facility] General Strength Comments: 4-/5  grossly RLE Assessment RLE Assessment: Exceptions to [facility] General Strength Comments: grossly 4/5 LLE Assessment LLE Assessment: Within Functional Limits  Care Tool Care Tool Bed Mobility Roll left and right activity   Roll left and right assist level: Supervision/Verbal cueing    Sit  to lying activity   Sit to lying assist level: Supervision/Verbal cueing    Lying to sitting on side of bed activity   Lying to sitting on side of bed assist level: the ability to move from lying on the back to sitting on the side of the bed with no back support.: Supervision/Verbal cueing     Care Tool Transfers Sit to stand transfer   Sit to stand assist level: Minimal Assistance - Patient > 75%    Chair/bed transfer   Chair/bed transfer assist level: Minimal Assistance - Patient > 75%     Toilet transfer   Assist Level: Minimal Assistance - Patient > 75%    Car transfer   Car transfer assist level: Contact Guard/Touching assist      Care Tool Locomotion Ambulation   Assist level: Minimal Assistance - Patient > 75% Assistive device: No Device Max distance: 200+  Walk 10 feet activity   Assist level: Minimal Assistance - Patient > 75% Assistive device: No Device   Walk 50 feet with 2 turns activity   Assist level: Minimal Assistance - Patient > 75% Assistive device: No Device  Walk 150 feet activity   Assist level: Minimal Assistance - Patient > 75% Assistive device: No Device  Walk 10 feet on uneven surfaces activity   Assist level: Minimal Assistance - Patient > 75% Assistive device: Other (comment) (no device)  Stairs   Assist level: Minimal Assistance - Patient > 75% Stairs assistive device: 1 hand rail Max number of stairs: 12  Walk up/down 1 step activity   Walk up/down 1 step (curb) assist level: Minimal Assistance - Patient > 75% Walk up/down 1 step or curb assistive device: 1 hand rail  Walk up/down 4 steps activity   Walk up/down 4 steps assist level: Minimal  Assistance - Patient > 75% Walk up/down 4 steps assistive device: 1 hand rail  Walk up/down 12 steps activity   Walk up/down 12 steps assist level: Minimal Assistance - Patient > 75% Walk up/down 12 steps assistive device: 1 hand rail  Pick up small objects from floor   Pick up small object from the floor assist level: Minimal Assistance - Patient > 75%    Wheelchair Is the patient using a wheelchair?: Yes Type of Wheelchair: Manual   Wheelchair assist level: Contact Guard/Touching assist Max wheelchair distance: 30  Wheel 50 feet with 2 turns activity   Assist Level: Dependent - Patient 0%  Wheel 150 feet activity   Assist Level: Dependent - Patient 0%    Refer to Care Plan for Long Term Goals  SHORT TERM GOAL WEEK 1 PT Short Term Goal 1 (Week 1): STG=LTG 2/2 ELOS  Recommendations for other services: None   Skilled Therapeutic Intervention Evaluation completed (see details above and below) with education on PT POC and goals and individual treatment initiated with focus on  gait, NMR, transfers.  Pt presents supine in bed and agreeable to terapy.  Pt performed all bed mobility w/ supervision from flat bed.  Pt sat EOB and donned scrub pants w/ min A for RLE and then transferred sit to stand w/ min A and mod A to complete pulling up pants for R side.  Pt required assist for threading R arm through scrub top and then supervision for completion.  Pt transferred sit to stand and then SPT to w/c w/ min A and verbal cues for sequencing.  Pt attempted w/c mobility w/ B LE s as IV hanging from L arm  and PT avoided using, but unable to maintain propulsion > 30'.  Pt amb w/o AD and min A to simulated sedan height car and transferred w/ CGA and cueing.  Pt negotiated ramp and mulch w/ min A.  Pt negotiated 12 steps w/ 1 rail and step-to descending and reciprocal pattern ascending, although changes throughout trial self-selected.  Pt performs safely either pattern.  Pt amb multiple trials w/o AD and  min A.  Pt returns to room and amb into BR, requiring mod A for clothing management, especially brief.  Pt handed off to NT for amb A + 1 to recliner.    Mobility Bed Mobility Bed Mobility: Rolling Right;Rolling Left;Supine to Sit Rolling Right: Supervision/verbal cueing Rolling Left: Supervision/Verbal cueing Supine to Sit: Supervision/Verbal cueing Transfers Transfers: Sit to Stand;Stand to Sit;Stand Pivot Transfers Sit to Stand: Minimal Assistance - Patient > 75% Stand to Sit: Minimal Assistance - Patient > 75% Stand Pivot Transfers: Minimal Assistance - Patient > 75% Stand Pivot Transfer Details: Verbal cues for precautions/safety Stand Pivot Transfer Details (indicate cue type and reason): for step-pivot. Transfer (Assistive device): None Locomotion  Gait Ambulation: Yes Gait Assistance: Minimal Assistance - Patient > 75% Gait Distance (Feet): 200 Feet Assistive device: None Gait Assistance Details: Verbal cues for precautions/safety Gait Assistance Details: verbal cues for R inattention. Gait Gait: Yes Gait Pattern: Decreased stance time - right Gait velocity: Decreased Stairs / Additional Locomotion Stairs: Yes Stairs Assistance: Minimal Assistance - Patient > 75% Stair Management Technique: One rail Left Number of Stairs: 12 Height of Stairs: 6 Ramp: Minimal Assistance - Patient >75% Curb: Minimal Assistance - Patient >75% Wheelchair Mobility Wheelchair Mobility: No (pt able to negotiate x 30' using B LE s, but difficult to maintain)   Discharge Criteria: Patient will be discharged from PT if patient refuses treatment 3 consecutive times without medical reason, if treatment goals not met, if there is a change in medical status, if patient makes no progress towards goals or if patient is discharged from hospital.  The above assessment, treatment plan, treatment alternatives and goals were discussed and mutually agreed upon: by patient  Ladoris Gene 02/27/2022, 12:25 PM

## 2022-02-27 NOTE — Progress Notes (Signed)
[facility] Progress Note   Assessment/ Plan:   Dialysis Orders: Center: [facility] T,Th,S 3:75 hrs [id number]/Autoflow 1.5 45 kg 2.0 K/ 2.0 Ca UFP 2 TDC -No heparin  -No ESA/Venofer -Hectorol 4 mcg IV TIW   Assessment/Plan: Acute vsSubacute Cortical CVA: Dysarthric, R arm weakness.  Dysphagia 2 diet.  Now in rehab.   Hypertensive Emergency: Select home medications have been resumed. Historically BP has been well controlled while in hospital. Per neuro, allowing permissive HTN, treat BP if SBP > 220.  PAF-Eliquis restarted 9/28  ESRD -  T,Th, S. HD on schedule, next 9/30.     Hypertension/volume  - HTN as noted above. No excess volume by exam or chest xray. UF as tolerated.   Anemia  - HGB 11.8. No ESA needed.   Metabolic bone disease -PO4 elevated at OP clinic. Resume binders, VDRA.   Nutrition - Albumin at goal. Renal diet.  Subjective:    Transferred to rehab yesterday.  Doing well.  Sitting up eating lunch.     Objective:   BP 109/79 (BP Location: Right Arm)   Pulse 95   Temp 98.2 F (36.8 C)   Resp 15   Ht '5\' 3"'$  (1.6 m)   Wt 44.3 kg   SpO2 100%   BMI 17.30 kg/m   Physical Exam: Gen:NAD, awake and alert CVS:RRR Resp:clear Abd: soft Ext:no LE edema ACCESS: R IJ TDC NEURO: sig dysarthria with R facial droop and R arm weakness  Labs: BMET Recent Labs  Lab 02/23/22 1444 02/23/22 1846 02/24/22 0411 02/26/22 1456 02/27/22 1028  NA 141  --  140 131* 133*  K 5.9* 4.0 4.0 3.9 4.2  CL 102  --  105 96* 92*  CO2 26  --  '24 24 26  '$ GLUCOSE 93  --  149* 156* 113*  BUN 42*  --  47* 15 37*  CREATININE 8.96*  --  10.03* 4.56* 7.92*  CALCIUM 10.5*  --  9.6 9.4 11.1*  PHOS  --   --   --  4.1 7.3*   CBC Recent Labs  Lab 02/23/22 1846 02/26/22 0744 02/27/22 1028  WBC 4.4 3.3* 3.6*  NEUTROABS 2.8  --  1.6*  HGB 11.8* 12.0 13.4  HCT 38.7 38.0 42.6  MCV 101.6* 97.7 98.2  PLT 284 261 282      Medications:     amLODipine  10 mg Oral Daily   apixaban   2.5 mg Oral BID   carvedilol  6.25 mg Oral BID WC   Chlorhexidine Gluconate Cloth  6 each Topical Daily   [START ON 02/28/2022] cloNIDine  0.1 mg Transdermal Q Sat   [START ON 02/28/2022] doxercalciferol  4 mcg Intravenous Q T,Th,Sa-HD   isosorbide mononitrate  30 mg Oral Daily   lidocaine  1 patch Transdermal Q24H   rosuvastatin  10 mg Oral Daily     Madelon Lips, MD [facility] [phone] 02/27/2022, 1:00 PM

## 2022-02-27 NOTE — Plan of Care (Signed)
  Problem: RH Swallowing Goal: LTG Patient will consume least restrictive diet using compensatory strategies with assistance (SLP) Description: LTG:  Patient will consume least restrictive diet using compensatory strategies with assistance (SLP) Flowsheets (Taken 02/27/2022 1742) LTG: Pt Patient will consume least restrictive diet using compensatory strategies with assistance of (SLP): Supervision Goal: LTG Pt will demonstrate functional change in swallow as evidenced by bedside/clinical objective assessment (SLP) Description: LTG: Patient will demonstrate functional change in swallow as evidenced by bedside/clinical objective assessment (SLP) Flowsheets (Taken 02/27/2022 1742) LTG: Patient will demonstrate functional change in swallow as evidenced by bedside/clinical objective assessment: Oropharyngeal swallow   Problem: RH Expression Communication Goal: LTG Patient will verbally express basic/complex needs(SLP) Description: LTG:  Patient will verbally express basic/complex needs, wants or ideas with cues  (SLP) Flowsheets (Taken 02/27/2022 1742) LTG: Patient will verbally express basic/complex needs, wants or ideas (SLP): Minimal Assistance - Patient > 75% Goal: LTG Patient will increase speech intelligibility (SLP) Description: LTG: Patient will increase speech intelligibility at word/phrase/conversation level with cues, % of the time (SLP) Flowsheets (Taken 02/27/2022 1742) LTG: Patient will increase speech intelligibility (SLP): Minimal Assistance - Patient > 75%   Problem: RH Problem Solving Goal: LTG Patient will demonstrate problem solving for (SLP) Description: LTG:  Patient will demonstrate problem solving for basic/complex daily situations with cues  (SLP) Flowsheets (Taken 02/27/2022 1742) LTG Patient will demonstrate problem solving for: Minimal Assistance - Patient > 75%   Problem: RH Memory Goal: LTG Patient will use memory compensatory aids to (SLP) Description: LTG:  Patient  will use memory compensatory aids to recall biographical/new, daily complex information with cues (SLP) Flowsheets (Taken 02/27/2022 1742) LTG: Patient will use memory compensatory aids to (SLP): Minimal Assistance - Patient > 75%

## 2022-02-27 NOTE — Progress Notes (Signed)
Inpatient Rehabilitation Care Coordinator Assessment and Plan Patient Details  Name: Monique Henderson MRN: [id number] Date of Birth: [birth year]  Today's Date: 02/27/2022  Hospital Problems: Principal Problem:   Acute cardioembolic stroke [facility])  Past Medical History:  Past Medical History:  Diagnosis Date   AF (paroxysmal atrial fibrillation) ([place]) 05/29/2019   on Coumadin   Aortic atherosclerosis ([place]) 07/05/2019   Aortic dissection ([place]) 04/04/2019   s/p repair   Bone spur 2008   Right calcaneal foot spur   Breast cancer ([place]) 2004   Ductal carcinoma in situ of the left breast; S/P left partial mastectomy 02/26/2003; S/P re-excision of cranial and lateral margins11/18/2004.radiation   Cerebral thrombosis with cerebral infarction 05/22/2019   Chronic low back pain 06/22/2016   Chronic obstructive lung disease ([place]) 01/16/2017   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 Autumn Messing; S/P re-excision of superior and inferior margins 10/27/2012.    ESRD on hemodialysis ([place]) 05/29/2019   Essential hypertension 09/16/2006   GERD 09/16/2006   Hepatitis C    treated and RNA confirmed not detectable 01/2017   Insomnia 03/14/2015   Malnutrition of moderate degree 05/19/2019   Non compliance with medical treatment 12/04/2017   Normocytic anemia    With thrombocytosis   Osteoarthritis    Right ureteral stone 2002   S/P lumbar spinal fusion 01/18/2014   S/P lumbar decompressive laminectomy, fusion, and plating for lumbar spinal stenosis on 05/27/2009 by Dr. Eustace Moore.  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 Eustace Moore.     Tobacco use disorder 04/19/2009   Uterine fibroid    Wears dentures    top   Past Surgical History:   Past Surgical History:  Procedure Laterality Date   ANTERIOR LAT LUMBAR FUSION N/A 01/18/2014   Procedure: ANTERIOR LATERAL LUMBAR FUSION LUMBAR TWO-THREE;  Surgeon: Eustace Moore, MD;  Location: [place] [facility];  Service: Neurosurgery;  Laterality: N/A;   ANTERIOR LUMBAR FUSION  01/18/2014   AV FISTULA PLACEMENT Left 04/20/2019   Procedure: ARTERIOVENOUS (AV) FISTULA CREATION;  Surgeon: [name], MD;  Location: [place];  Service: Vascular;  Laterality: Left;   BACK SURGERY     BREAST LUMPECTOMY Left 01/2003   BREAST LUMPECTOMY Right 2014   BREAST LUMPECTOMY WITH NEEDLE LOCALIZATION AND AXILLARY SENTINEL LYMPH NODE BX Right 10/13/2012   Procedure: BREAST LUMPECTOMY WITH NEEDLE LOCALIZATION;  Surgeon: [name], MD;  Location: [place];  Service: General;  Laterality: Right;  Right breast wire localized lumpectomy   INSERTION OF DIALYSIS CATHETER Right 04/20/2019   Procedure: INSERTION OF DIALYSIS CATHETER, right internal jugular;  Surgeon: [name], MD;  Location: [place];  Service: Vascular;  Laterality: Right;   INSERTION OF DIALYSIS CATHETER Right 09/24/2020   Procedure: INSERTION OF TUNNELED DIALYSIS CATHETER;  Surgeon: Rosetta Posner, MD;  Location: [place];  Service: Vascular;  Laterality: Right;   IR FLUORO GUIDE CV LINE RIGHT  09/22/2020   IR THORACENTESIS ASP PLEURAL SPACE W/IMG GUIDE  05/19/2019   IR US GUIDE VASC ACCESS LEFT  09/22/2020   IR US GUIDE VASC ACCESS RIGHT  09/22/2020   IR VENOCAVAGRAM SVC  09/22/2020   LAMINECTOMY  05/27/2009   Lumbar decompressive laminectomy, fusion and plating for lumbar  spinal stensosis   LIGATION OF ARTERIOVENOUS  FISTULA Left 09/24/2020   Procedure: LIGATION OF LEFT ARM ARTERIOVENOUS  FISTULA;  Surgeon: Rosetta Posner, MD;  Location: [place];  Service: Vascular;  Laterality: Left;   LUMBAR LAMINECTOMY/DECOMPRESSION MICRODISCECTOMY Left 03/23/2013   Procedure: LUMBAR LAMINECTOMY/DECOMPRESSION MICRODISCECTOMY 1  LEVEL;  Surgeon: Eustace Moore, MD;  Location: [place] [facility];  Service: Neurosurgery;  Laterality: Left;  LUMBAR LAMINECTOMY/DECOMPRESSION MICRODISCECTOMY 1 LEVEL   MASTECTOMY, PARTIAL Left 02/26/2003   ; S/P re-excision of cranial and lateral margins 04/19/2003.    RE-EXCISION OF BREAST CANCER,SUPERIOR MARGINS Right 10/27/2012   Procedure: RE-EXCISION OF BREAST CANCER,SUPERIOR and inferior MARGINS;  Surgeon: [name], MD;  Location: [place];  Service: General;  Laterality: Right;   RE-EXCISION OF BREAST LUMPECTOMY Left 04/2003   TEE WITHOUT CARDIOVERSION N/A 04/04/2019   Procedure: Transesophageal Echocardiogram ([place]);  Surgeon: Wonda Olds, MD;  Location: [place];  Service: Open Heart Surgery;  Laterality: N/A;   THORACIC AORTIC ANEURYSM REPAIR N/A 04/04/2019   Procedure: THORACIC ASCENDING ANEURYSM REPAIR (AAA)  USING 28 MM X 30 CM HEMASHIELD PLATINUM VASCULAR GRAFT;  Surgeon: Wonda Olds, MD;  Location[facility];  Service: Open Heart Surgery;  Laterality: N/A;   Social History:  reports that she has been smoking cigarettes. She has a 11.00 pack-year smoking history. She has never used smokeless tobacco. She reports that she does not currently use alcohol after a past usage of about 2.0 standard drinks of alcohol per week. She reports that she does not currently use drugs after having used the following drugs: Cocaine.  Family / Support Systems Marital Status: Single Patient Roles: Partner, Parent Spouse/Significant Other: Monique Henderson Children: Monique Henderson (son) [phone] Other Supports: None reported Anticipated Caregiver: PRN Ability/Limitations of Caregiver: Pt will ahve PRN support at d/c Caregiver Availability: Intermittent Family Dynamics: Pt lives alone.  Social History Preferred language: English Religion: Holiness Education: high school Health Literacy - How often do you need to have someone help you when you read instructions, pamphlets, or other written material  from your doctor or pharmacy?: Never Writes: Yes Employment Status: Retired Date Retired/Disabled/Unemployed: 2019 Age Retired: 89 Public relations account executive Issues: Pt admits she may ahve spent one night in jail. Denies anything else Guardian/Conservator: N/A   Abuse/Neglect Abuse/Neglect Assessment Can Be Completed: Yes Physical Abuse: Denies Verbal Abuse: Denies Sexual Abuse: Denies Exploitation of patient/patient's resources: Denies Self-Neglect: Denies  Patient response to: Social Isolation - How often do you feel lonely or isolated from those around you?: Never  Emotional Status Pt's affect, behavior and adjustment status: Pt in good spirits at time of visit. Pt has garbled speech and does better with yes/no questions. Recent Psychosocial Issues: Denies Psychiatric History: Denies Substance Abuse History: Pt admits she have a cigarette every once in awhile. Denies etoh and rec drug use.  Patient / Family Perceptions, Expectations & Goals Pt/Family understanding of illness & functional limitations: Pt and family have a general understanding of pt care needs Premorbid pt/family roles/activities: Independent Anticipated changes in roles/activities/participation: Assistance with ADLs/IADLs Pt/family expectations/goals: Pt goal is to work on her right side and speech.  Community Resources [facility]s: None Premorbid Home Care/DME Agencies: None Transportation available at discharge: TBD Is the patient able to respond to transportation needs?: Yes In the past 12 months, has lack of transportation kept you from medical appointments or from getting medications?: No In the past 12 months, has lack of transportation kept you from meetings, work, or from  getting things needed for daily living?: No Resource referrals recommended: Neuropsychology  Discharge Planning Living Arrangements: Alone Support Systems: Spouse/significant other, Children Type of Residence: Private  residence Insurance Resources: Multimedia programmer (specify) ([place]) Financial Resources: Radio broadcast assistant Screen Referred: No Living Expenses: Rent Money Management: Patient Does the patient have any problems obtaining your medications?: No Home Management: Pt manages all homecare needs Patient/Family Preliminary Plans: TBD Care Coordinator Barriers to Discharge: Decreased caregiver support, Lack of/limited family support, Insurance for SNF coverage, Hemodialysis Care Coordinator Anticipated Follow Up Needs: HH/OP  Clinical Impression SW met with pt in room to introduce self, explain role, and discuss discharge process. Pt is not a English as a second language teacher. No HCPOA. DME: RW and 3in 1BSC. Pt aware SW will follow-up with her son.  [id number]-[id number] left message for [name] [phone]) introducing self, explaining role, informing on d/c process, and SW will follow-up after team conference with updates.  *Pt son returned phone call. SW addressed above. He is aware SW will follow-up.   Cyriah Childrey A Renton Berkley 02/27/2022, 1:07 PM

## 2022-02-27 NOTE — Progress Notes (Signed)
PROGRESS NOTE   Subjective/Complaints:  No events overnight. No acute complaints. Working with therapies this AM. Nursing later does report recurrent chest pain episode; BP, HR, RR, and sats all WNL. PA evaluated at bedside, patient in no distress, endorses CP occurring on off dialysis days. Lidocaine patch ordered.   ROS: Difficult to ascertain d/t severe dysarthria.  ROS: Denies fevers, chills, N/V, abdominal pain, constipation, diarrhea, SOB, cough, chest pain, new weakness or paraesthesias.     Objective:   No results found. Recent Labs    02/26/22 0744 02/27/22 1028  WBC 3.3* 3.6*  HGB 12.0 13.4  HCT 38.0 42.6  PLT 261 282   Recent Labs    02/26/22 1456 02/27/22 1028  NA 131* 133*  K 3.9 4.2  CL 96* 92*  CO2 24 26  GLUCOSE 156* 113*  BUN 15 37*  CREATININE 4.56* 7.92*  CALCIUM 9.4 11.1*    Intake/Output Summary (Last 24 hours) at 02/27/2022 1431 Last data filed at 02/27/2022 1259 Gross per 24 hour  Intake 438 ml  Output --  Net 438 ml        Physical Exam: Vital Signs Blood pressure 109/79, pulse 95, temperature 98.2 F (36.8 C), resp. rate 15, height '5\' 3"'$  (1.6 m), weight 44.3 kg, SpO2 100 %. Physical Exam Constitutional: Appropriate appearance for age.  HENT: No JVD. Neck Supple. Trachea midline. Atraumatic, normocephalic. Eyes: PERRLA. EOMI. Visual fields grossly intact.  Cardiovascular: RRR, no murmurs/rub/gallops. No Edema. Peripheral pulses 2+  Respiratory: CTAB. No rales, rhonchi, or wheezing. On RA.  Abdomen: + bowel sounds, normoactive. No distention or tenderness.  GU: Not examined. Skin: C/D/I. No apparent lesions. MSK:      No apparent deformity. Chest pain not reproducible on palpation.       Strength:                RUE: 3-/5 SA, 2+/5 EF, 2+/5 EE, 3/5 WE, 3/5 FF, 3/5 FA                 LUE: 5/5 SA, 5/5 EF, 5/5 EE, 5/5 WE, 5/5 FF, 5/5 FA                 RLE: 5/5 HF, 5/5 KE, 5/5 DF,  5/5 EHL, 5/5 PF                 LLE:  4/5 HF, 4/5 KE, 5-/5 DF, 5-/5 EHL, 5-/5 PF    Neurologic exam:  Cognition: AAO to person, place, time and event.  Language: Severe dysarthria, difficult to understand but fluent with appropriate responses.  Insight: Good insight into current condition.  CN: + R facial droop, + R tongue deviation, + R shoulder weakness; otherwise intact        Assessment/Plan: 1. Functional deficits which require 3+ hours per day of interdisciplinary therapy in a comprehensive inpatient rehab setting. Physiatrist is providing close team supervision and 24 hour management of active medical problems listed below. Physiatrist and rehab team continue to assess barriers to discharge/monitor patient progress toward functional and medical goals  Care Tool:  Bathing    Body parts bathed by patient: Right arm, Chest, Abdomen,  Front perineal area, Right upper leg, Left upper leg, Left lower leg, Face   Body parts bathed by helper: Left arm, Right lower leg, Buttocks     Bathing assist Assist Level: Moderate Assistance - Patient 50 - 74% (simulated)     Upper Body Dressing/Undressing Upper body dressing   What is the patient wearing?: Pull over shirt    Upper body assist Assist Level: Maximal Assistance - Patient 25 - 49%    Lower Body Dressing/Undressing Lower body dressing      What is the patient wearing?: Pants     Lower body assist Assist for lower body dressing: Moderate Assistance - Patient 50 - 74%     Toileting Toileting    Toileting assist Assist for toileting: Moderate Assistance - Patient 50 - 74% (simulated)     Transfers Chair/bed transfer  Transfers assist     Chair/bed transfer assist level: Minimal Assistance - Patient > 75%     Locomotion Ambulation   Ambulation assist      Assist level: Minimal Assistance - Patient > 75% Assistive device: No Device Max distance: 200+   Walk 10 feet activity   Assist     Assist  level: Minimal Assistance - Patient > 75% Assistive device: No Device   Walk 50 feet activity   Assist    Assist level: Minimal Assistance - Patient > 75% Assistive device: No Device    Walk 150 feet activity   Assist    Assist level: Minimal Assistance - Patient > 75% Assistive device: No Device    Walk 10 feet on uneven surface  activity   Assist     Assist level: Minimal Assistance - Patient > 75% Assistive device: Other (comment) (no device)   Wheelchair     Assist Is the patient using a wheelchair?: Yes Type of Wheelchair: Manual    Wheelchair assist level: Contact Guard/Touching assist Max wheelchair distance: 30    Wheelchair 50 feet with 2 turns activity    Assist        Assist Level: Dependent - Patient 0%   Wheelchair 150 feet activity     Assist      Assist Level: Dependent - Patient 0%   Blood pressure 109/79, pulse 95, temperature 98.2 F (36.8 C), resp. rate 15, height '5\' 3"'$  (1.6 m), weight 44.3 kg, SpO2 100 %.  Medical Problem List and Plan: 1. 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             -patient may shower             -ELOS/Goals: 10-14 days 2.  Antithrombotics: -DVT/anticoagulation:  Pharmaceutical: Eliquis             -antiplatelet therapy: none 3. Pain Management: Tylenol as needed 4. Mood/Behavior/Sleep: LCSW to evaluate and provide emotional support             -antipsychotic agents: n/a 5. Neuropsych/cognition: This patient is capable of making decisions on her own behalf. 6. Skin/Wound Care: routine skin care checks 7. Fluids/Electrolytes/Nutrition: strict Is and Os. 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 Chest pain - intermittent, L>R lower ribs, primarily on off-dialysis days       - 9/28 Tachycardia, irregular into 120s on initial exam; EKG unchanged. Troponin elevated, approx same as prior levels.  On chart review, several similar episodes while inpatient, consistently negative cardiac workup.        - 9/29 - 1x episode, vitals stable, self-resolved. Added lidocaine patch to lower chest.   LOS: 1 days A FACE TO FACE EVALUATION WAS PERFORMED  Gertie Gowda 02/27/2022, 2:31 PM

## 2022-02-27 NOTE — Progress Notes (Signed)
Met with patient and her sister at bedside. Discussed team conference, oriented to rehab, informed that binder has educational materials. Discussed chg baths twice a day, Afib, patient reports that was on Eliquis at home, discussed CVA, CHF and daily weights, HLD medications, diet, pain and constipation. Asked if still smoked, patient denies smoking. Sister reports that she is concerned for sisters health.  She asked if any test/lab would show she was smoking. I educated that if smoking drugs yes but not tobacco. Removed smoking materials from binder as both agreed that she was not smoking cigarettes.

## 2022-02-27 NOTE — Evaluation (Signed)
Occupational Therapy Assessment and Plan  Patient Details  Name: Monique Henderson MRN: [id number] Date of Birth: [birth year]  OT Diagnosis: abnormal posture, acute pain, hemiplegia affecting dominant side, and muscle weakness (generalized) Rehab Potential: Rehab Potential (ACUTE ONLY): Good ELOS: 7-10 days   Today's Date: 02/27/2022 OT Individual Time: 4982-6415 OT Individual Time Calculation (min): 57 min     Hospital Problem: Principal Problem:   Acute cardioembolic stroke [facility])   Past Medical History:  Past Medical History:  Diagnosis Date   AF (paroxysmal atrial fibrillation) ([place]) 05/29/2019   on Coumadin   Aortic atherosclerosis ([place]) 07/05/2019   Aortic dissection ([place]) 04/04/2019   s/p repair   Bone spur 2008   Right calcaneal foot spur   Breast cancer ([place]) 2004   Ductal carcinoma in situ of the left breast; S/P left partial mastectomy 02/26/2003; S/P re-excision of cranial and lateral margins11/18/2004.radiation   Cerebral thrombosis with cerebral infarction 05/22/2019   Chronic low back pain 06/22/2016   Chronic obstructive lung disease ([place]) 01/16/2017   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 Autumn Messing; S/P re-excision of superior and inferior margins 10/27/2012.    ESRD on hemodialysis ([place]) 05/29/2019   Essential hypertension 09/16/2006   GERD 09/16/2006   Hepatitis C    treated and RNA confirmed not detectable 01/2017   Insomnia 03/14/2015   Malnutrition of moderate degree 05/19/2019   Non compliance with medical treatment 12/04/2017   Normocytic anemia    With thrombocytosis   Osteoarthritis    Right ureteral stone 2002   S/P lumbar spinal fusion 01/18/2014   S/P lumbar decompressive laminectomy, fusion, and plating for lumbar spinal stenosis on 05/27/2009 by Dr. Eustace Moore.  S/P anterolateral retroperitoneal interbody fusion L2-3 utilizing a 8 mm peek interbody cage packed with morcellized  allograft, and anterior lumbar plating L2-3 for recurrent disc herniation L2-3 with spinal stenosis on 01/18/2014 by Dr. Eustace Moore.     Tobacco use disorder 04/19/2009   Uterine fibroid    Wears dentures    top   Past Surgical History:  Past Surgical History:  Procedure Laterality Date   ANTERIOR LAT LUMBAR FUSION N/A 01/18/2014   Procedure: ANTERIOR LATERAL LUMBAR FUSION LUMBAR TWO-THREE;  Surgeon: Eustace Moore, MD;  Location: [place] [facility];  Service: Neurosurgery;  Laterality: N/A;   ANTERIOR LUMBAR FUSION  01/18/2014   AV FISTULA PLACEMENT Left 04/20/2019   Procedure: ARTERIOVENOUS (AV) FISTULA CREATION;  Surgeon: [name], MD;  Location: [place];  Service: Vascular;  Laterality: Left;   BACK SURGERY     BREAST LUMPECTOMY Left 01/2003   BREAST LUMPECTOMY Right 2014   BREAST LUMPECTOMY WITH NEEDLE LOCALIZATION AND AXILLARY SENTINEL LYMPH NODE BX Right 10/13/2012   Procedure: BREAST LUMPECTOMY WITH NEEDLE LOCALIZATION;  Surgeon: [name], MD;  Location: [place];  Service: General;  Laterality: Right;  Right breast wire localized lumpectomy   INSERTION OF DIALYSIS CATHETER Right 04/20/2019   Procedure: INSERTION OF DIALYSIS CATHETER, right internal jugular;  Surgeon: [name], MD;  Location: [place];  Service: Vascular;  Laterality: Right;   INSERTION OF DIALYSIS CATHETER Right 09/24/2020   Procedure: INSERTION OF TUNNELED DIALYSIS CATHETER;  Surgeon: Rosetta Posner, MD;  [facility];  Service: Vascular;  Laterality: Right;   IR FLUORO GUIDE CV LINE RIGHT  09/22/2020   IR THORACENTESIS ASP PLEURAL SPACE W/IMG GUIDE  05/19/2019  IR US GUIDE VASC ACCESS LEFT  09/22/2020   IR US GUIDE VASC ACCESS RIGHT  09/22/2020   IR VENOCAVAGRAM SVC  09/22/2020   LAMINECTOMY  05/27/2009   Lumbar decompressive laminectomy, fusion and plating for lumbar spinal stensosis   LIGATION OF ARTERIOVENOUS  FISTULA Left 09/24/2020   Procedure: LIGATION OF LEFT  ARM ARTERIOVENOUS  FISTULA;  Surgeon: Rosetta Posner, MD;  Location: [place];  Service: Vascular;  Laterality: Left;   LUMBAR LAMINECTOMY/DECOMPRESSION MICRODISCECTOMY Left 03/23/2013   Procedure: LUMBAR LAMINECTOMY/DECOMPRESSION MICRODISCECTOMY 1 LEVEL;  Surgeon: Eustace Moore, MD;  Location: [place] [facility];  Service: Neurosurgery;  Laterality: Left;  LUMBAR LAMINECTOMY/DECOMPRESSION MICRODISCECTOMY 1 LEVEL   MASTECTOMY, PARTIAL Left 02/26/2003   ; S/P re-excision of cranial and lateral margins 04/19/2003.    RE-EXCISION OF BREAST CANCER,SUPERIOR MARGINS Right 10/27/2012   Procedure: RE-EXCISION OF BREAST CANCER,SUPERIOR and inferior MARGINS;  Surgeon: [name], MD;  Location: [place];  Service: General;  Laterality: Right;   RE-EXCISION OF BREAST LUMPECTOMY Left 04/2003   TEE WITHOUT CARDIOVERSION N/A 04/04/2019   Procedure: Transesophageal Echocardiogram ([place]);  Surgeon: Wonda Olds, MD;  Location: [place];  Service: Open Heart Surgery;  Laterality: N/A;   THORACIC AORTIC ANEURYSM REPAIR N/A 04/04/2019   Procedure: THORACIC ASCENDING ANEURYSM REPAIR (AAA)  USING 28 MM X 30 CM HEMASHIELD PLATINUM VASCULAR GRAFT;  Surgeon: Wonda Olds, MD;  [facility];  Service: Open Heart Surgery;  Laterality: N/A;    Assessment & Plan Clinical Impression:  Monique Henderson is a 71 year old female who developed sudden onset of facial droop, slurred speech and right-sided weakness on the morning of 02/22/2022.  She [facility] emergency department via EMS 9/25 and code stroke was activated.  She was seen in consultation by Dr. Leonie Man.  Cup included MRI of the brain which revealed acute/subacute cortical infarct of the left posterior frontal and anterior parietal lobes including the central sulcus and additional focal acute/subacute infarct involving the caudate head.  She has a history of paroxysmal atrial fibrillation on Eliquis.  MR angiogram of neck and brain without large vessel stenosis.   Concerned about possible fibromuscular dysplasia involving distal ICAs but no evidence of hemodynamically significant stenoses.  Her history also includes end-stage renal disease on intermittent hemodialysis and nephrology was consulted.  She is tolerating dysphagia 2 diet with thin liquids. 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 Patient transferred to CIR on 02/26/2022 .    Patient currently requires mod with basic self-care skills secondary to muscle weakness, decreased cardiorespiratoy endurance, decreased coordination, decreased awareness and decreased safety awareness, and decreased standing balance and hemiplegia.  Prior to hospitalization, patient could complete all self-care independently.  Patient will benefit from skilled intervention to decrease level of assist with basic self-care skills, increase independence with basic self-care skills, and increase level of independence with iADL prior to discharge home with care partner.  Anticipate patient will require 24 hour supervision and follow up outpatient.  OT - End of Session Activity Tolerance: Tolerates 10 - 20 min activity with multiple rests Endurance Deficit: Yes Endurance Deficit Description: demonstrated fatigue with ADLs and pt requesting to go to bed after prior PT session and OT session OT Assessment Rehab Potential (ACUTE ONLY): Good OT Barriers to Discharge: Home environment access/layout;Lack of/limited family support OT Patient demonstrates impairments in the following area(s): Balance;Endurance;Motor;Pain;Sensory;Safety OT Basic ADL's Functional  Problem(s): Grooming;Bathing;Dressing;Toileting OT Advanced ADL's Functional Problem(s): Simple Meal Preparation OT Transfers Functional Problem(s): Toilet;Tub/Shower OT Additional Impairment(s): Fuctional Use of Upper Extremity OT Plan OT  Intensity: Minimum of 1-2 x/day, 45 to 90 minutes OT Frequency: 5 out of 7 days OT Duration/Estimated Length of Stay: 7-10 days OT Treatment/Interventions: Discharge planning;Balance/vestibular training;Functional electrical stimulation;Pain management;Self Care/advanced ADL retraining;Therapeutic Activities;UE/LE Coordination activities;Cognitive remediation/compensation;Disease mangement/prevention;Functional mobility training;Patient/family education;Therapeutic Exercise;DME/adaptive equipment instruction;Neuromuscular re-education;Splinting/orthotics;UE/LE Strength taining/ROM;Wheelchair propulsion/positioning OT Self Feeding Anticipated Outcome(s): Set up A OT Basic Self-Care Anticipated Outcome(s): Supervision OT Toileting Anticipated Outcome(s): Supervision OT Bathroom Transfers Anticipated Outcome(s): Supervision OT Recommendation Patient destination: Home Follow Up Recommendations: 24 hour supervision/assistance;Outpatient OT Equipment Recommended: To be determined   OT Evaluation Precautions/Restrictions  Precautions Precautions: Fall Precaution Comments: R UE hemiparesis Restrictions Weight Bearing Restrictions: No Home Living/Prior Functioning Home Living Family/patient expects to be discharged to:: Private residence Living Arrangements: Non-relatives/Friends Available Help at Discharge: Family (sister and son can assist 24/7 by trading off) Type of Home: Apartment Home Access: Other (comment) (lives on 2nd floor but has chair lift that can take her to the entrance) Home Layout: One level Bathroom Shower/Tub: Walk-in shower, Sponge bathes at baseline Bathroom Toilet: Standard Bathroom Accessibility: Yes Additional Comments: able to gather info with yes/no questions, however would confirm with family to determine accuracy  Lives With: Other (Comment) (indicated that she had "someone who did live with her" however no longer does) IADL History Homemaking Responsibilities:  Yes Meal Prep Responsibility: Primary Current License: Yes Mode of Transportation: Car Prior Function Level of Independence: Independent with basic ADLs, Independent with homemaking with ambulation, Independent with gait  Able to Take Stairs?: Yes Driving: Yes Vision Baseline Vision/History: 0 No visual deficits Ability to See in Adequate Light: 0 Adequate Patient Visual Report: No change from baseline Vision Assessment?: Yes Eye Alignment: Within Functional Limits Ocular Range of Motion: Within Functional Limits Alignment/Gaze Preference: Within Defined Limits Tracking/Visual Pursuits: Decreased smoothness of vertical tracking Saccades: Within functional limits Perception  Perception: Within Functional Limits Praxis Praxis: Intact Cognition Cognition Overall Cognitive Status: Difficult to assess Arousal/Alertness: Lethargic Orientation Level: Person;Place;Situation Person: Oriented Place: Oriented Situation: Oriented Memory: Appears intact Awareness: Appears intact Problem Solving: Appears intact Safety/Judgment: Impaired Brief Interview for Mental Status (BIMS) Repetition of Three Words (First Attempt): 3 Temporal Orientation: Year: Correct Temporal Orientation: Month: Accurate within 5 days Temporal Orientation: Day: Incorrect Recall: "Sock": Yes, no cue required Recall: "Blue": Yes, no cue required Recall: "Bed": Yes, no cue required BIMS Summary Score: 14 Sensation Sensation Light Touch: Appears Intact Proprioception: Impaired by gross assessment (RUE) Additional Comments: pt reports no sensation changes in RUE however with mild deficits noted upon assessment Coordination Gross Motor Movements are Fluid and Coordinated: No Fine Motor Movements are Fluid and Coordinated: No Coordination and Movement Description: generalized incoordination due to hemiparesis/plegia, weakness and balance deficits Finger Nose Finger Test: unable to complete on RUE due to  hemi-plegia, however dysmetria on LUE Motor  Motor Motor: Hemiplegia Motor - Skilled Clinical Observations: minimal R hemi LE  Trunk/Postural Assessment  Cervical Assessment Cervical Assessment: Within Functional Limits Thoracic Assessment Thoracic Assessment: Within Functional Limits Lumbar Assessment Lumbar Assessment: Within Functional Limits  Balance Balance Balance Assessed: Yes Dynamic Sitting Balance Dynamic Sitting - Balance Support: Feet supported Dynamic Sitting - Level of Assistance: 5: Stand by assistance Static Standing Balance Static Standing - Balance Support: No upper extremity supported Static Standing - Level of Assistance: 4: Min assist Extremity/Trunk Assessment RUE Assessment RUE Assessment: Exceptions to [facility] Passive  Range of Motion (PROM) Comments: Limited shoulder flexion at 120, otherwise WFL distally Active Range of Motion (AROM) Comments: 90 degrees shoulder flexion, WFL elbow extension, otherwise impaired due to hemiplegia General Strength Comments: 3-/5 shoulder flexion, 4-/5 triceps, flaccid distally RUE Body System: Neuro Brunstrum levels for arm and hand: Hand Brunstrum level for hand: Stage I Flaccidity LUE Assessment LUE Assessment: Exceptions to [facility] Active Range of Motion (AROM) Comments: Limted shoulder flexion at 120 degrees, otherwise [facility] General Strength Comments: 4-/5 grossly  Care Tool Care Tool Self Care Eating        Oral Care    Oral Care Assist Level: Minimal Assistance - Patient > 75%    Bathing   Body parts bathed by patient: Right arm;Chest;Abdomen;Front perineal area;Right upper leg;Left upper leg;Left lower leg;Face Body parts bathed by helper: Left arm;Right lower leg;Buttocks   Assist Level: Moderate Assistance - Patient 50 - 74% (simulated)    Upper Body Dressing(including orthotics)   What is the patient wearing?: Pull over shirt   Assist Level: Maximal Assistance - Patient 25 - 49%    Lower Body Dressing  (excluding footwear)   What is the patient wearing?: Pants Assist for lower body dressing: Moderate Assistance - Patient 50 - 74%    Putting on/Taking off footwear   What is the patient wearing?: Non-skid slipper socks Assist for footwear: Moderate Assistance - Patient 50 - 74%       Care Tool Toileting Toileting activity   Assist for toileting: Moderate Assistance - Patient 50 - 74%     Care Tool Bed Mobility Roll left and right activity   Roll left and right assist level: Supervision/Verbal cueing    Sit to lying activity   Sit to lying assist level: Supervision/Verbal cueing    Lying to sitting on side of bed activity   Lying to sitting on side of bed assist level: the ability to move from lying on the back to sitting on the side of the bed with no back support.: Supervision/Verbal cueing     Care Tool Transfers Sit to stand transfer   Sit to stand assist level: Minimal Assistance - Patient > 75%    Chair/bed transfer   Chair/bed transfer assist level: Minimal Assistance - Patient > 75%     Toilet transfer   Assist Level: Minimal Assistance - Patient > 75%     Care Tool Cognition  Expression of Ideas and Wants Expression of Ideas and Wants: 2. Frequent difficulty - frequently exhibits difficulty with expressing needs and ideas  Understanding Verbal and Non-Verbal Content Understanding Verbal and Non-Verbal Content: 3. Usually understands - understands most conversations, but misses some part/intent of message. Requires cues at times to understand   Memory/Recall Ability Memory/Recall Ability : Current season;That he or she is in a hospital/hospital unit   Refer to Care Plan for [place] 1 OT Short Term Goal 1 (Week 1): STG = LTG 2/2 ELOS  Recommendations for other services: None    Skilled Therapeutic Intervention Skilled OT intervention completed with discussion on POC, rehab goals and explanation of OT purpose. Pt received upright  in recliner, agreeable to session. Pt did not indicate pain initially however requested meds for un-rated pain under L breast with nursing made aware. Pt politely declining full bathing, and was already wearing clothing, therefore agreeable to simulate bathing/dressing tasks at the sit > stand level. See caretool for further details on assist level with self-care tasks performed. Completed  sit > stands with CGA no AD, then CGA-min A ambulatory transfer with and without HHA to and from [facility] over toilet. Pt declined formal need to void at this time, but simulated toileting tasks at mod A level. Pt demonstrates proximal activation of the RUE, however limited distally with some neglect noted during ambulation with cues needed for awareness to maintain skin integrity. Does not demo any visual deficits. Completed AROM scapular retraction and elevation x10 each, and AAROM shoulder flexion and elbow flexion/ext 2x10. Transitioned into bed with supervision. Remained semi-upright with bed alarm on and all needs in reach at end of session.  ADL ADL Eating: Set up Where Assessed-Eating: Bed level Grooming: Minimal assistance (simulated) Where Assessed-Grooming: Sitting at sink Upper Body Bathing: Minimal assistance Where Assessed-Upper Body Bathing: Chair (simulated) Lower Body Bathing: Minimal assistance (simulated) Where Assessed-Lower Body Bathing: Chair Upper Body Dressing: Maximal assistance Where Assessed-Upper Body Dressing: Chair Lower Body Dressing: Moderate assistance Where Assessed-Lower Body Dressing: Chair Toileting: Moderate assistance (simulated) Where Assessed-Toileting: Bedside Commode;Glass blower/designer: Psychiatric nurse Method: Counselling psychologist: Radiographer, therapeutic: Unable to assess Tub/Shower Transfer Method: Unable to assess Social research officer, government: Unable to assess Social research officer, government Method: Unable to assess Mobility   Bed Mobility Bed Mobility: Rolling Right;Rolling Left;Supine to Sit Rolling Right: Supervision/verbal cueing Rolling Left: Supervision/Verbal cueing Supine to Sit: Supervision/Verbal cueing Transfers Sit to Stand: Minimal Assistance - Patient > 75% Stand to Sit: Minimal Assistance - Patient > 75%   Discharge Criteria: Patient will be discharged from OT if patient refuses treatment 3 consecutive times without medical reason, if treatment goals not met, if there is a change in medical status, if patient makes no progress towards goals or if patient is discharged from hospital.  The above assessment, treatment plan, treatment alternatives and goals were discussed and mutually agreed upon: by patient  Blase Mess, MS, OTR/L  02/27/2022, 12:30 PM

## 2022-02-28 MED ORDER — HEPARIN SODIUM (PORCINE) 1000 UNIT/ML IJ SOLN
INTRAMUSCULAR | Status: AC
  Administered 2022-02-28: 3800 [IU]
  Filled 2022-02-28: qty 4

## 2022-02-28 NOTE — Progress Notes (Signed)
  [facility] Progress Note   Subjective:  Seen in room - feels ok today. Reports CP with last HD, resolved now. Will watch her with HD today.  Objective Vitals:   02/27/22 1226 02/27/22 1837 02/27/22 1925 02/28/22 0515  BP: 109/79 (!) 132/96 109/76 (!) 143/86  Pulse: 95 87 90 80  Resp: '15 18 18 17  '$ Temp: 98.2 F (36.8 C)  98.5 F (36.9 C) 98.2 F (36.8 C)  TempSrc:   Oral   SpO2: 100%  100% 99%  Weight:    44.2 kg  Height:       Physical Exam General: Frail woman, NAD. Room air.  Heart: RRR; no murmur Lungs: CTAB Abdomen: soft Extremities: no LE edema Dialysis Access: R [facility] Neuro: Significant dysarthria and R arm weakness  Additional Objective Labs: Basic Metabolic Panel: Recent Labs  Lab 02/24/22 0411 02/26/22 1456 02/27/22 1028  NA 140 131* 133*  K 4.0 3.9 4.2  CL 105 96* 92*  CO2 '24 24 26  '$ GLUCOSE 149* 156* 113*  BUN 47* 15 37*  CREATININE 10.03* 4.56* 7.92*  CALCIUM 9.6 9.4 11.1*  PHOS  --  4.1 7.3*   Liver Function Tests: Recent Labs  Lab 02/23/22 1444 02/26/22 1456 02/27/22 1028  AST 44*  --  21  ALT 12  --  12  ALKPHOS 78  --  69  BILITOT 0.6  --  0.4  PROT 7.5  --  7.8  ALBUMIN 4.1 4.0 4.1   CBC: Recent Labs  Lab 02/23/22 1846 02/26/22 0744 02/27/22 1028  WBC 4.4 3.3* 3.6*  NEUTROABS 2.8  --  1.6*  HGB 11.8* 12.0 13.4  HCT 38.7 38.0 42.6  MCV 101.6* 97.7 98.2  PLT 284 261 282   Medications:   amLODipine  10 mg Oral Daily   apixaban  2.5 mg Oral BID   carvedilol  6.25 mg Oral BID WC   Chlorhexidine Gluconate Cloth  6 each Topical Daily   Chlorhexidine Gluconate Cloth  6 each Topical Q0600   cloNIDine  0.1 mg Transdermal Q Sat   doxercalciferol  4 mcg Intravenous Q T,Th,Sa-HD   isosorbide mononitrate  30 mg Oral Daily   lidocaine  1 patch Transdermal Q24H   rosuvastatin  10 mg Oral Daily    Dialysis Orders: TTS at AF clinic 3:75 hrs [id number]/Autoflow 1.5 45 kg 2.0 K/ 2.0 Ca UFP 2 TDC no heparin - No  ESA/Venofer - Hectorol 4 mcg IV TIW  Assessment/Plan: Acute cortical CVA: Dysarthric, R arm weakness.  Now in CIR.  Hypertensive Emergency:  BP better now, no edema on exam or imaging.   PAF: On Eliquis (restarted 9/28) ESRD: Continue HD on TTS; HD today. 1L UFG planned. Anemia: Hgb > 11, no ESA needed.  Metabolic bone disease: Ca super high on 9/29, Phos high as well, hold VDRA today and repeat labs.  Nutrition - Albumin at goal. Renal diet.    Veneta Penton, PA-C 02/28/2022, 10:05 AM  [employer]

## 2022-02-28 NOTE — Progress Notes (Signed)
Received patient in bed to unit.  Alert and oriented.  Informed consent signed and in chart.   Treatment initiated: 3p Treatment completed: 649p  Patient tolerated well.  Transported back to the room  Alert, without acute distress.  Hand-off given to patient's nurse.   Access used: yes Catheter Access issues: No  Total UF removed: 1000 Medication(s) given: No Post HD VS: 142/104, 86,22, RA 100 Post HD weight: 41.2 kg   [facility] 3p

## 2022-03-01 LAB — RENAL FUNCTION PANEL
Albumin: 3.9 g/dL (ref 3.5–5.0)
Anion gap: 14 (ref 5–15)
BUN: 34 mg/dL — ABNORMAL HIGH (ref 8–23)
CO2: 28 mmol/L (ref 22–32)
Calcium: 10.5 mg/dL — ABNORMAL HIGH (ref 8.9–10.3)
Chloride: 96 mmol/L — ABNORMAL LOW (ref 98–111)
Creatinine, Ser: 7.22 mg/dL — ABNORMAL HIGH (ref 0.44–1.00)
GFR, Estimated: 6 mL/min — ABNORMAL LOW (ref >60)
Glucose, Bld: 100 mg/dL — ABNORMAL HIGH (ref 70–99)
Phosphorus: 7.3 mg/dL — ABNORMAL HIGH (ref 2.5–4.6)
Potassium: 5.1 mmol/L (ref 3.5–5.1)
Sodium: 138 mmol/L (ref 135–145)

## 2022-03-01 NOTE — Progress Notes (Addendum)
Nursing provided bath this shift, lotion down and  assistance with  ADL's,.Patient stated she had a bath earlier but desired another due to feeling itchy. Itchiness resolved after care was provided. Patient comfortable, chair alarm activated, call bell within reach and family member present and satisfied with care.    Yehuda Mao, LPN

## 2022-03-01 NOTE — Progress Notes (Signed)
Patient family member called concerning patient calling and stated  that she was crying or the phone stating that patient could not get brief changed due to the fact that she was itching. Family member was informed that someone will be in the room shortly to take care of the situation. Family member proceeded to state that patient stated that she called and we informed patient that we told her wait. Educated family member that that was not the case. Informed family member that patient stated that she wanted to get washed again and explained that staff was helping other patients at the moment and will get to her next. Family member requested cloth brief and family member educated to the products that the hospital provides and stated that the family is welcome to bring what the patient uses at home or what she is comfortable with. Nurse was told that another family member is on the way up to the facility. Family member requested nurses name. Name of nurse taking care of patient and name of nurse that was taking the call was given to family member. Family member stated that she will call back later. Sanda Linger, RN

## 2022-03-01 NOTE — Progress Notes (Incomplete)
PROGRESS NOTE   Subjective/Complaints:  No events overnight. No acute complaints. States they had to stop dialysis early 9/30 d/t chest pain. No events since.   ROS: Difficult to ascertain d/t severe dysarthria.   Objective:   No results found. Recent Labs    02/27/22 1028  WBC 3.6*  HGB 13.4  HCT 42.6  PLT 282    Recent Labs    02/27/22 1028 03/01/22 0959  NA 133* 138  K 4.2 5.1  CL 92* 96*  CO2 26 28  GLUCOSE 113* 100*  BUN 37* 34*  CREATININE 7.92* 7.22*  CALCIUM 11.1* 10.5*     Intake/Output Summary (Last 24 hours) at 03/01/2022 2358 Last data filed at 03/01/2022 1700 Gross per 24 hour  Intake 480 ml  Output --  Net 480 ml         Physical Exam: Vital Signs Blood pressure 128/84, pulse 82, temperature 97.6 F (36.4 C), temperature source Oral, resp. rate 16, height '5\' 3"'$  (1.6 m), weight 45 kg, SpO2 100 %. Physical Exam Constitutional: Appropriate appearance for age.  HENT: No JVD. Neck Supple. Trachea midline. Atraumatic, normocephalic. Eyes: PERRLA. EOMI. Visual fields grossly intact.  Cardiovascular: RRR, no murmurs/rub/gallops. No Edema. Peripheral pulses 2+  Respiratory: CTAB. No rales, rhonchi, or wheezing. On RA.  Abdomen: + bowel sounds, normoactive. No distention or tenderness.  GU: Not examined. Skin: C/D/I. No apparent lesions. MSK:      No apparent deformity. Chest pain not reproducible on palpation.       Strength:                RUE: 3-/5 SA, 2+/5 EF, 2+/5 EE, 3/5 WE, 3/5 FF, 3/5 FA                 LUE: 5/5                 RLE: 5/5                 LLE:  5/5   Neurologic exam:  Cognition: AAO to person, place, time and event.  Language: Severe dysarthria, difficult to understand but fluent with appropriate responses.  Insight: Good insight into current condition.  CN: + R facial droop, + R tongue deviation, + R shoulder weakness; otherwise intact         Assessment/Plan: 1. Functional deficits which require 3+ hours per day of interdisciplinary therapy in a comprehensive inpatient rehab setting. Physiatrist is providing close team supervision and 24 hour management of active medical problems listed below. Physiatrist and rehab team continue to assess barriers to discharge/monitor patient progress toward functional and medical goals  Care Tool:  Bathing    Body parts bathed by patient: Face   Body parts bathed by helper: Left arm, Right lower leg, Buttocks     Bathing assist Assist Level: Contact Guard/Touching assist     Upper Body Dressing/Undressing Upper body dressing   What is the patient wearing?: Pull over shirt    Upper body assist Assist Level: Maximal Assistance - Patient 25 - 49%    Lower Body Dressing/Undressing Lower body dressing      What is the patient  wearing?: Pants     Lower body assist Assist for lower body dressing: Contact Guard/Touching assist     Toileting Toileting    Toileting assist Assist for toileting: Moderate Assistance - Patient 50 - 74% (simulated)     Transfers Chair/bed transfer  Transfers assist     Chair/bed transfer assist level: Supervision/Verbal cueing     Locomotion Ambulation   Ambulation assist      Assist level: Contact Guard/Touching assist Assistive device: No Device Max distance: 200+   Walk 10 feet activity   Assist     Assist level: Contact Guard/Touching assist Assistive device: No Device   Walk 50 feet activity   Assist    Assist level: Contact Guard/Touching assist Assistive device: No Device    Walk 150 feet activity   Assist    Assist level: Contact Guard/Touching assist Assistive device: No Device    Walk 10 feet on uneven surface  activity   Assist     Assist level: Minimal Assistance - Patient > 75% Assistive device: Other (comment) (no device)   Wheelchair     Assist Is the patient using a wheelchair?:  Yes Type of Wheelchair: Manual    Wheelchair assist level: Contact Guard/Touching assist Max wheelchair distance: 30    Wheelchair 50 feet with 2 turns activity    Assist        Assist Level: Dependent - Patient 0%   Wheelchair 150 feet activity     Assist      Assist Level: Dependent - Patient 0%   Blood pressure 128/84, pulse 82, temperature 97.6 F (36.4 C), temperature source Oral, resp. rate 16, height '5\' 3"'$  (1.6 m), weight 45 kg, SpO2 100 %.  Medical Problem List and Plan: 1. 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             -patient may shower             -ELOS/Goals: 10-14 days 2.  Antithrombotics: -DVT/anticoagulation:  Pharmaceutical: Eliquis             -antiplatelet therapy: none 3. Pain Management: Tylenol as needed 4. Mood/Behavior/Sleep: LCSW to evaluate and provide emotional support             -antipsychotic agents: n/a 5. Neuropsych/cognition: This patient is capable of making decisions on her own behalf. 6. Skin/Wound Care: routine skin care checks 7. Fluids/Electrolytes/Nutrition: strict Is and Os. Labs per nephrology             -dysphagia 2/thin liquids; SLP eval             - Nephrology following for dialysis; defer electrolyte management to them  8: Hypertension: monitor             -continue amlodipine 10 mg daily             -continue carvedilol 6.25 mg daily             -continue clonidine 0.1 mg patch weekly (Sat)             -continue isosorbide mononitrate 30 mg daily    03/01/2022    1:00 PM 03/01/2022    5:00 AM 03/01/2022    3:45 AM  Vitals with BMI  Weight  99 lbs 3 oz   BMI  26.37   Systolic 858  299  Diastolic 84  242  Pulse 82  88     10: 9: Hyperlipidemia: continue Crestor 10 mg daily 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 Chest pain - intermittent, L>R lower ribs, primarily on off-dialysis days       - 9/28 Tachycardia, irregular into 120s on initial exam; EKG unchanged. Troponin elevated, approx same as prior levels.  On chart review, several similar episodes while inpatient, consistently negative cardiac workup.        - 9/29 - 1x episode, vitals stable, self-resolved. Added lidocaine patch to lower chest.           - 10/1 - no episodes since dialysis yesterday; sta LOS: 3 days A FACE TO FACE EVALUATION WAS PERFORMED  Gertie Gowda 03/01/2022, 11:58 PM

## 2022-03-01 NOTE — Progress Notes (Signed)
Occupational Therapy Session Note  Patient Details  Name: Monique Henderson MRN: [id number] Date of Birth: 03-May-1951  Today's Date: 03/01/2022 OT Individual Time: 8022-3361 OT Individual Time Calculation (min): 75 min    Short Term Goals: Week 1:  OT Short Term Goal 1 (Week 1): STG = LTG 2/2 ELOS  Skilled Therapeutic Interventions/Progress Updates:    Upon OT arrival, pt supine in bed reporting no pain and is requesting to eat. Treatment intervention with a focus on self care retraining, neuro re-ed, scanning, problem solving, memory, balance, and functional mobility. Pt requires supervision seated upright in bed to eat her food with verbal cues to check for pocketing food in R cheek. Pt completes sit to stand transfer with CGA and ambulates to her dresser CGA to retrieve clothes with attempts to use the R UE but mainly uses the L UE. Pt ambulates to w/c CGA to donn shorts seated in w/c with CGA. Pt opens door with Min A for manipulation of door knob with the R UE and retrieves a washcloth from overhead shelf with the L UE and CGA. Pt ambulates to the sink CGA to wash her face while standing with CGA. Pt ambulates with CGA to the recliner to donn shoes while seated in recliner with Supervision. Pt ambulates to w/c CGA and transfers into w/c with CGA. Pt was transported to dayroom via w/c and total A. Pt completes sit to stand transfer with Supervision and stands with CGA to sort playing cards onto vertical mirror board. Pt scans to R to recognize card, taps the matching card with the R UE then retrieves card with the L UE to place onto matching card. Pt able to perform adequate ROM to reach up to ~95 degrees with the R UE. Pt sorts all cards without errors and has no LOB. Pt completes stand to sit transfer with Supervision . While seated at tabletop, pt performs towel tabletop exercises including shoulder flex/ext, external/internal rotation, and circles clockwise and counterclockwise for 1x10 reps  with good muscle activation and no compensation noted. Pt was given 2 step direction including to walk to rehab apartment and back to her room. Pt able follow 2 step task without verbal cues and performs functional mobility with CGA. Pt returns to sitting EOB with Supervision and sits in long sitting with all needs met and safety measures in place at end of session. Pt requesting to finish her breakfast and RN notified since pt requires supervision to complete.   Therapy Documentation Precautions:  Precautions Precautions: Fall Precaution Comments: Min R LE hemiparesis Restrictions Weight Bearing Restrictions: No   ADL: ADL Eating: Set up, Supervision/safety Where Assessed-Eating: Bed level Grooming: Contact guard Where Assessed-Grooming: Standing at sink Lower Body Dressing: Contact guard (to donn pants and shoes) Where Assessed-Lower Body Dressing: Sitting at sink, Standing at sink, Chair     Therapy/Group: Individual Therapy  Marvetta Gibbons 03/01/2022, 8:23 AM

## 2022-03-01 NOTE — IPOC Note (Signed)
Overall Plan of Care [facility]) Patient Details Name: ISABELLE MATT MRN: [id number] DOB: 1951-03-15  Admitting Diagnosis: Acute cardioembolic stroke [facility])  Hospital Problems: Principal Problem:   Acute cardioembolic stroke [facility])     Functional Problem List: Nursing Pain, Bowel, Edema, Safety, Endurance, Sensory, Medication Management, Skin Integrity, Motor  PT Balance, Safety, Motor  OT Balance, Endurance, Motor, Pain, Sensory, Safety  SLP Cognition, Linguistic, Nutrition, Motor  TR         Basic ADL's: OT Grooming, Bathing, Dressing, Toileting     Advanced  ADL's: OT Simple Meal Preparation     Transfers: PT Bed Mobility, Bed to Chair, Car, Furniture, Floor  OT Toilet, Tub/Shower     Locomotion: PT Ambulation, Stairs     Additional Impairments: OT Fuctional Use of Upper Extremity  SLP Swallowing, Communication, Social Cognition expression Problem Solving, Memory  TR      Anticipated Outcomes Item Anticipated Outcome  Self Feeding Set up A  Swallowing  sup A   Basic self-care  Supervision  Toileting  Supervision   Bathroom Transfers Supervision  Bowel/Bladder  continent B/B; Oliguria LBM 09/26  Transfers  mod I  Locomotion  supervision w/o AD  Communication  min A  Cognition  min A  Pain  less than 3  Safety/Judgment  remain fall free while in rehab   Therapy Plan: PT Intensity: Minimum of 1-2 x/day ,45 to 90 minutes PT Frequency: 5 out of 7 days PT Duration Estimated Length of Stay: 7-10 days OT Intensity: Minimum of 1-2 x/day, 45 to 90 minutes OT Frequency: 5 out of 7 days OT Duration/Estimated Length of Stay: 7-10 days SLP Intensity: Minumum of 1-2 x/day, 30 to 90 minutes SLP Frequency: 3 to 5 out of 7 days SLP Duration/Estimated Length of Stay: 7-10 days   Team Interventions: Nursing Interventions Patient/Family Education, Disease Management/Prevention, Skin Care/Wound Management, Discharge Planning, Pain Management, Cognitive  Remediation/Compensation, Psychosocial Support, Bowel Management, Medication Management, Dysphagia/Aspiration Precaution Training  PT interventions Ambulation/gait training, Community reintegration, Neuromuscular re-education, Stair training, UE/LE Strength taining/ROM, Therapeutic Activities, UE/LE Coordination activities, Therapeutic Exercise  OT Interventions Discharge planning, Balance/vestibular training, Functional electrical stimulation, Pain management, Self Care/advanced ADL retraining, Therapeutic Activities, UE/LE Coordination activities, Cognitive remediation/compensation, Disease mangement/prevention, Functional mobility training, Patient/family education, Therapeutic Exercise, DME/adaptive equipment instruction, Neuromuscular re-education, Splinting/orthotics, UE/LE Strength taining/ROM, Wheelchair propulsion/positioning  SLP Interventions Cognitive remediation/compensation, Functional tasks, Dysphagia/aspiration precaution training, Internal/external aids, Multimodal communication approach, Oral motor exercises, Patient/family education, Speech/Language facilitation, Therapeutic Activities, Therapeutic Exercise  TR Interventions    SW/CM Interventions Discharge Planning, Psychosocial Support, Patient/Family Education   Barriers to Discharge MD  Medical stability, Home enviroment access/loayout, Incontinence, Wound care, Lack of/limited family support, Hemodialysis, Medication compliance, Nutritional means, and dysarthria.  Nursing Decreased caregiver support, Hemodialysis, Medication compliance lives in 1 level apt with level entry  PT      OT Home environment access/layout, Lack of/limited family support    SLP      SW Decreased caregiver support, Lack of/limited family support, Insurance underwriter for SNF coverage, Hemodialysis     Team Discharge Planning: Destination: PT-Home ,OT- Home , SLP-Home Projected Follow-up: PT-Outpatient PT, OT-  24 hour supervision/assistance, Outpatient OT,  SLP-24 hour supervision/assistance, Home Health SLP, Outpatient SLP Projected Equipment Needs: PT-To be determined, OT- To be determined, SLP-None recommended by SLP Equipment Details: PT-possible w/c for longer distances ??, OT-  Patient/family involved in discharge planning: PT- Patient,  OT-Patient, SLP-Patient, Family member/caregiver  MD ELOS: 10-14 days Medical Rehab Prognosis:  Good Assessment:  The patient has been admitted for CIR therapies with the diagnosis of Left sided stroke. The team will be addressing functional mobility, strength, stamina, balance, safety, adaptive techniques and equipment, self-care, bowel and bladder mgt, patient and caregiver education, diet modification, and communication with severe dysarthria. Goals have been set at [place]. Anticipated discharge destination is home.       See Team Conference Notes for weekly updates to the plan of care     Gertie Gowda, DO 03/01/2022

## 2022-03-01 NOTE — Progress Notes (Signed)
Physical Therapy Session Note  Patient Details  Name: Monique Henderson MRN: [id number] Date of Birth: 10/23/1950  Today's Date: 03/01/2022 PT Individual Time: 1315-1415 PT Individual Time Calculation (min): 60 min   Short Term Goals: Week 1:  PT Short Term Goal 1 (Week 1): STG=LTG 2/2 ELOS  Skilled Therapeutic Interventions/Progress Updates:    Session focused on functional gait on unit and NMR to address higher level balance, R hemibody coordination/weightbearing/strengthening, and overall functional mobility.  Pt performed basic transfers and gait without AD throughout session with close supervision to CGA for safety due to mild impulsivity and decreased R attention and decreased balance strategies - occasional mild LOB's requiring CGA to min assist to recover.  Stair negotiation for home entry x 12 steps with CGA with combination of R rail use only and no handrail with overall CGA and cues for slowed movement to make sure R foot in good placement during stair negotiation.  NMR to address the above impairments including:  * Dynamic gait while tossing and catching a ball with focus on incorporating functional use of RUE sidestepping R and L x 25' each direction with CGA overall, kicking a yoga block with alternating BLE during gait x 50' with focus on coordination and SLS.  * Dynamic balance to kick ball with alternating BLE and incorporating movement outside BOS for stepping reactions with CGA overall  * Quadruped (cga to get in and out of position) with forced weightbearing through R hemibody and then progressing to lifting each extremity. Requires min assist when lifting LLE x 5 -8 reps each with rest breaks as needed due to muscle fatigue.  * Seated fine motor activities with cup and cone with focus on RUE coordination/control and grasp with active assisted movements and pt able to work towards progressing with increased independence. Also incorporated functional hand to mouth movements  with cup to simulate feeding  as well.    Education provided throughout session on ways  to incorporate RUE into her daily tasks more to increase functional return as well as education on overall stroke recovery.   Pt very receptive to feedback. Difficulty with communication at times due to dysarthria but when slowing down, usually able to get  her message across.   End of session in room with safety belt donned and family present to visit.   Therapy Documentation Precautions:  Precautions Precautions: Fall Precaution Comments: Min R LE hemiparesis Restrictions Weight Bearing Restrictions: No  Pain:  No  complaints of pain    Therapy/Group: Individual Therapy  Canary Brim Ivory Broad, PT, DPT, CBIS  03/01/2022, 2:21 PM

## 2022-03-01 NOTE — Progress Notes (Signed)
Physical Therapy Session Note  Patient Details  Name: Monique Henderson MRN: [id number] Date of Birth: 03/27/1951  Today's Date: 03/01/2022 PT Individual Time: 1020-1115 PT Individual Time Calculation (min): 55 min   Short Term Goals: Week 1:  PT Short Term Goal 1 (Week 1): STG=LTG 2/2 ELOS  Skilled Therapeutic Interventions/Progress Updates:    Pt presents in bed, agreeable to session but states she is worried about her rent. Bed mobility with supervision to come to EOB. Slightly impulsive at times with mobility, cues for safety. Performed CGA stand step transfer to w/c. Performed Berg balance assessment for fall risk assessment and discussed results with patient (see details below).   Focused on NMR during dynamic gait to address balance, coordination and RLE strengthening for obstacle course, toe taps to cone, and stepping over objects as well as lateral side stepping. Pt overall CGA to light min assist at times, pt noted to limit full ROM movements despite cues and demonstration.   Functional gait back to room without AD for mobility training, balance, and strengthening with overall CGA for balance > 150', decreased step length noted at times. End of session set up in recliner with all needs in reach and safety belt donned.   Therapy Documentation Precautions:  Precautions Precautions: Fall Precaution Comments: Min R LE hemiparesis Restrictions Weight Bearing Restrictions: No  Pain: Pain Assessment Pain Scale: 0-10 Pain Score: 0-No pain    Balance: Standardized Balance Assessment Standardized Balance Assessment: Berg Balance Test Berg Balance Test Sit to Stand: Needs minimal aid to stand or to stabilize Standing Unsupported: Able to stand 2 minutes with supervision Sitting with Back Unsupported but Feet Supported on Floor or Stool: Able to sit safely and securely 2 minutes Stand to Sit: Controls descent by using hands Transfers: Needs one person to assist Standing  Unsupported with Eyes Closed: Able to stand 10 seconds with supervision Standing Ubsupported with Feet Together: Needs help to attain position but able to stand for 30 seconds with feet together From Standing, Reach Forward with Outstretched Arm: Can reach forward >5 cm safely (2") From Standing Position, Pick up Object from Floor: Able to pick up shoe, needs supervision From Standing Position, Turn to Look Behind Over each Shoulder: Turn sideways only but maintains balance Turn 360 Degrees: Needs close supervision or verbal cueing Standing Unsupported, Alternately Place Feet on Step/Stool: Able to complete >2 steps/needs minimal assist Standing Unsupported, One Foot in Front: Loses balance while stepping or standing Standing on One Leg: Unable to try or needs assist to prevent fall Total Score: 25    Therapy/Group: Individual Therapy  Canary Brim Ivory Broad, PT, DPT, CBIS  03/01/2022, 11:23 AM

## 2022-03-01 NOTE — Progress Notes (Signed)
PROGRESS NOTE   Subjective/Complaints:  No events overnight. No acute complaints. States they had to stop dialysis early 9/30 d/t chest pain. No events since.  LBM 9/29.  ROS: Difficult to ascertain d/t severe dysarthria.   Objective:   No results found. Recent Labs    02/27/22 1028  WBC 3.6*  HGB 13.4  HCT 42.6  PLT 282    Recent Labs    02/27/22 1028 03/01/22 0959  NA 133* 138  K 4.2 5.1  CL 92* 96*  CO2 26 28  GLUCOSE 113* 100*  BUN 37* 34*  CREATININE 7.92* 7.22*  CALCIUM 11.1* 10.5*     Intake/Output Summary (Last 24 hours) at 03/01/2022 2358 Last data filed at 03/01/2022 1700 Gross per 24 hour  Intake 480 ml  Output --  Net 480 ml         Physical Exam: Vital Signs Blood pressure 128/84, pulse 82, temperature 97.6 F (36.4 C), temperature source Oral, resp. rate 16, height '5\' 3"'$  (1.6 m), weight 45 kg, SpO2 100 %. Physical Exam Constitutional: Appropriate appearance for age.  HENT: No JVD. Neck Supple. Trachea midline. Atraumatic, normocephalic. Eyes: PERRLA. EOMI. Visual fields grossly intact.  Cardiovascular: RRR, no murmurs/rub/gallops. No Edema. Peripheral pulses 2+  Respiratory: CTAB. No rales, rhonchi, or wheezing. On RA.  Abdomen: + bowel sounds, normoactive. No distention or tenderness.  GU: Not examined. Skin: C/D/I. No apparent lesions. MSK:      No apparent deformity. Chest pain not reproducible on palpation.       Strength:                RUE: 3-/5 SA, 2+/5 EF, 2+/5 EE, 3/5 WE, 3/5 FF, 3/5 FA                 LUE: 5/5                 RLE: 5/5                 LLE:  5/5   Neurologic exam:  Cognition: AAO to person, place, time and event.  Language: Severe dysarthria, difficult to understand but fluent with appropriate responses.  Insight: Good insight into current condition.  CN: + R facial droop, + R tongue deviation, + R shoulder weakness; otherwise intact         Assessment/Plan: 1. Functional deficits which require 3+ hours per day of interdisciplinary therapy in a comprehensive inpatient rehab setting. Physiatrist is providing close team supervision and 24 hour management of active medical problems listed below. Physiatrist and rehab team continue to assess barriers to discharge/monitor patient progress toward functional and medical goals  Care Tool:  Bathing    Body parts bathed by patient: Face   Body parts bathed by helper: Left arm, Right lower leg, Buttocks     Bathing assist Assist Level: Contact Guard/Touching assist     Upper Body Dressing/Undressing Upper body dressing   What is the patient wearing?: Pull over shirt    Upper body assist Assist Level: Maximal Assistance - Patient 25 - 49%    Lower Body Dressing/Undressing Lower body dressing      What is  the patient wearing?: Pants     Lower body assist Assist for lower body dressing: Contact Guard/Touching assist     Toileting Toileting    Toileting assist Assist for toileting: Moderate Assistance - Patient 50 - 74% (simulated)     Transfers Chair/bed transfer  Transfers assist     Chair/bed transfer assist level: Supervision/Verbal cueing     Locomotion Ambulation   Ambulation assist      Assist level: Contact Guard/Touching assist Assistive device: No Device Max distance: 200+   Walk 10 feet activity   Assist     Assist level: Contact Guard/Touching assist Assistive device: No Device   Walk 50 feet activity   Assist    Assist level: Contact Guard/Touching assist Assistive device: No Device    Walk 150 feet activity   Assist    Assist level: Contact Guard/Touching assist Assistive device: No Device    Walk 10 feet on uneven surface  activity   Assist     Assist level: Minimal Assistance - Patient > 75% Assistive device: Other (comment) (no device)   Wheelchair     Assist Is the patient using a wheelchair?:  Yes Type of Wheelchair: Manual    Wheelchair assist level: Contact Guard/Touching assist Max wheelchair distance: 30    Wheelchair 50 feet with 2 turns activity    Assist        Assist Level: Dependent - Patient 0%   Wheelchair 150 feet activity     Assist      Assist Level: Dependent - Patient 0%   Blood pressure 128/84, pulse 82, temperature 97.6 F (36.4 C), temperature source Oral, resp. rate 16, height '5\' 3"'$  (1.6 m), weight 45 kg, SpO2 100 %.  Medical Problem List and Plan: 1. 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             -patient may shower             -ELOS/Goals: 10-14 days 2.  Antithrombotics: -DVT/anticoagulation:  Pharmaceutical: Eliquis             -antiplatelet therapy: none 3. Pain Management: Tylenol as needed 4. Mood/Behavior/Sleep: LCSW to evaluate and provide emotional support             -antipsychotic agents: n/a 5. Neuropsych/cognition: This patient is capable of making decisions on her own behalf. 6. Skin/Wound Care: routine skin care checks 7. Fluids/Electrolytes/Nutrition: strict Is and Os. Labs per nephrology             -dysphagia 2/thin liquids; SLP eval             - Nephrology following for dialysis; defer electrolyte management to them  8: Hypertension: monitor             -continue amlodipine 10 mg daily             -continue carvedilol 6.25 mg daily             -continue clonidine 0.1 mg patch weekly (Sat)             -continue isosorbide mononitrate 30 mg daily    03/01/2022    1:00 PM 03/01/2022    5:00 AM 03/01/2022    3:45 AM  Vitals with BMI  Weight  99 lbs 3 oz   BMI  17.58  Systolic 419  379  Diastolic 84  024  Pulse 82  88     10: 9: Hyperlipidemia: continue Crestor 10 mg daily 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 Chest pain - intermittent, L>R lower ribs, primarily on off-dialysis days       - 9/28 Tachycardia, irregular into 120s on initial exam; EKG unchanged. Troponin elevated, approx same as prior levels.  On chart review, several similar episodes while inpatient, consistently negative cardiac workup.        - 9/29 - 1x episode, vitals stable, self-resolved. Added lidocaine patch to lower chest.           - 10/1 - no episodes since dialysis yesterday; stable LOS: 3 days A FACE TO FACE EVALUATION WAS PERFORMED  Monique Henderson 03/01/2022, 11:58 PM

## 2022-03-01 NOTE — Progress Notes (Signed)
  [facility] Progress Note   Subjective:  Seen in room - sitting up in bed, looks much stronger. No CP with HD yesterday, 1L UF off. She is wondering if a discharge date has been set for her - not sure, advised to ask her rehab team.  Objective Vitals:   02/28/22 1852 02/28/22 1957 03/01/22 0345 03/01/22 0500  BP: (!) 142/104 (!) 145/100 (!) 168/111   Pulse: 86 91 88   Resp: (!) '22 16 20   '$ Temp: 98.2 F (36.8 C) 98.2 F (36.8 C) 98.4 F (36.9 C)   TempSrc: Oral  Oral   SpO2: 100% 100% 97%   Weight:    45 kg  Height:       Physical Exam General: Frail woman, NAD. Room air.  Heart: RRR; no murmur Lungs: CTAB Abdomen: soft Extremities: no LE edema Dialysis Access: R [facility] Neuro: Significant dysarthria and R arm weakness  Additional Objective Labs: Basic Metabolic Panel: Recent Labs  Lab 02/24/22 0411 02/26/22 1456 02/27/22 1028  NA 140 131* 133*  K 4.0 3.9 4.2  CL 105 96* 92*  CO2 '24 24 26  '$ GLUCOSE 102* 725* 113*  BUN 47* 15 37*  CREATININE 10.03* 4.56* 7.92*  CALCIUM 9.6 9.4 11.1*  PHOS  --  4.1 7.3*   Liver Function Tests: Recent Labs  Lab 02/23/22 1444 02/26/22 1456 02/27/22 1028  AST 44*  --  21  ALT 12  --  12  ALKPHOS 78  --  69  BILITOT 0.6  --  0.4  PROT 7.5  --  7.8  ALBUMIN 4.1 4.0 4.1   CBC: Recent Labs  Lab 02/23/22 1846 02/26/22 0744 02/27/22 1028  WBC 4.4 3.3* 3.6*  NEUTROABS 2.8  --  1.6*  HGB 11.8* 12.0 13.4  HCT 38.7 38.0 42.6  MCV 101.6* 97.7 98.2  PLT 284 261 282   Medications:   amLODipine  10 mg Oral Daily   apixaban  2.5 mg Oral BID   carvedilol  6.25 mg Oral BID WC   Chlorhexidine Gluconate Cloth  6 each Topical Daily   Chlorhexidine Gluconate Cloth  6 each Topical Q0600   cloNIDine  0.1 mg Transdermal Q Sat   isosorbide mononitrate  30 mg Oral Daily   lidocaine  1 patch Transdermal Q24H   rosuvastatin  10 mg Oral Daily    Dialysis Orders: TTS at [facility] 3:75 hrs [id number]/Autoflow 1.5 45 kg  2.0 K/ 2.0 Ca UFP 2 TDC no heparin - No ESA/Venofer - Hectorol 4 mcg IV TIW   Assessment/Plan: Acute cortical CVA: Dysarthric, R arm weakness.  Now in CIR. Hypertensive Emergency:  BP variable, no edema on exam or imaging.   PAF: On Eliquis (restarted 9/28) ESRD: Continue HD on TTS; next HD 10/3. Anemia: Hgb > 11, no ESA needed.  Metabolic bone disease: Ca super high on 9/29, Phos high as well, holding VDRA and repeat labs.  Nutrition - Albumin at goal. Renal diet.  Veneta Penton, PA-C 03/01/2022, 9:34 AM  [employer]

## 2022-03-02 DIAGNOSIS — N186 End stage renal disease: Secondary | ICD-10-CM

## 2022-03-02 DIAGNOSIS — I10 Essential (primary) hypertension: Secondary | ICD-10-CM

## 2022-03-02 DIAGNOSIS — Z992 Dependence on renal dialysis: Secondary | ICD-10-CM

## 2022-03-02 DIAGNOSIS — R079 Chest pain, unspecified: Secondary | ICD-10-CM

## 2022-03-02 MED ORDER — LIDOCAINE-PRILOCAINE 2.5-2.5 % EX CREA: 1.0000 | TOPICAL_CREAM | CUTANEOUS | Status: DC | PRN

## 2022-03-02 MED ORDER — PENTAFLUOROPROP-TETRAFLUOROETH EX AERO: 1.0000 | INHALATION_SPRAY | CUTANEOUS | Status: DC | PRN

## 2022-03-02 MED ORDER — HEPARIN SODIUM (PORCINE) 1000 UNIT/ML DIALYSIS: 1000.0000 [IU] | INTRAMUSCULAR | Status: DC | PRN

## 2022-03-02 MED ORDER — LIDOCAINE HCL (PF) 1 % IJ SOLN: 5.0000 mL | INTRAMUSCULAR | Status: DC | PRN

## 2022-03-02 MED ORDER — CHLORHEXIDINE GLUCONATE CLOTH 2 % EX PADS
6.0000 | MEDICATED_PAD | Freq: Every day | CUTANEOUS | Status: DC
  Administered 2022-03-03 – 2022-03-05 (×3): 6 via TOPICAL

## 2022-03-02 MED ORDER — ALTEPLASE 2 MG IJ SOLR: 2.0000 mg | Freq: Once | INTRAMUSCULAR | Status: DC | PRN

## 2022-03-02 NOTE — Discharge Instructions (Addendum)
Inpatient Rehab Discharge Instructions  Monique Henderson Discharge date and time:  03/08/2022  Activities/Precautions/ Functional Status: Activity: no lifting, driving, or strenuous exercise until cleared by MD Diet: cardiac diet Wound Care: none needed Functional status:  ___ No restrictions     ___ Walk up steps independently ___ 24/7 supervision/assistance   ___ Walk up steps with assistance __x_ Intermittent supervision/assistance  ___ Bathe/dress independently ___ Walk with walker     ___ Bathe/dress with assistance ___ Walk Independently    ___ Shower independently ___ Walk with assistance    __x_ Shower with assistance __x_ No alcohol     ___ Return to work/school ________  Special Instructions:   No driving, alcohol consumption or tobacco use.     COMMUNITY REFERRALS UPON DISCHARGE:     Outpatient: PT  OT  SP             Agency:[facility]   ADDRESS: [place] [address] [phone]              Appointment Date/Time:WILL CALL TO SET UP APPOINTMENTS  Medical Equipment/Items Ordered:NONE                                                 Agency/Supplier:NA    STROKE/TIA DISCHARGE INSTRUCTIONS SMOKING Cigarette smoking nearly doubles your risk of having a stroke & is the single most alterable risk factor  If you smoke or have smoked in the last 12 months, you are advised to quit smoking for your health. Most of the excess cardiovascular risk related to smoking disappears within a year of stopping. Ask you doctor about anti-smoking medications [place] Quit Line: 1-800-QUIT NOW Free Smoking Cessation Classes [phone]  CHOLESTEROL Know your levels; limit fat & cholesterol in your diet  Lipid Panel     Component Value Date/Time   CHOL 104 02/24/2022 0411   CHOL 167 05/13/2020 1108   TRIG 43 02/24/2022 0411   HDL 49 02/24/2022 0411   HDL 39 (L) 05/13/2020 1108   CHOLHDL 2.1 02/24/2022 0411   VLDL 9 02/24/2022 0411   LDLCALC  46 02/24/2022 0411   LDLCALC 100 (H) 05/13/2020 1108     Many patients benefit from treatment even if their cholesterol is at goal. Goal: Total Cholesterol (CHOL) less than 160 Goal:  Triglycerides (TRIG) less than 150 Goal:  HDL greater than 40 Goal:  LDL (LDLCALC) less than 100   BLOOD PRESSURE American Stroke Association blood pressure target is less that 120/80 mm/Hg  Your discharge blood pressure is:  BP: 131/82 Monitor your blood pressure Limit your salt and alcohol intake Many individuals will require more than one medication for high blood pressure  DIABETES (A1c is a blood sugar average for last 3 months) Goal HGBA1c is under 7% (HBGA1c is blood sugar average for last 3 months)  Diabetes: No known diagnosis of diabetes    Lab Results  Component Value Date   HGBA1C 4.7 (L) 02/23/2022    Your HGBA1c can be lowered with medications, healthy diet, and exercise. Check your blood sugar as directed by your physician Call your physician if you experience unexplained or low blood sugars.  PHYSICAL ACTIVITY/REHABILITATION Goal is 30 minutes at least 4 days per week  Activity: Increase activity slowly, Therapies: as instructed/arranged by social worker Return to work: when  cleared by MD Activity decreases your risk of heart attack and stroke and makes your heart stronger.  It helps control your weight and blood pressure; helps you relax and can improve your mood. Participate in a regular exercise program. Talk with your doctor about the best form of exercise for you (dancing, walking, swimming, cycling).  DIET/WEIGHT Goal is to maintain a healthy weight  Your discharge diet is:  Diet Order             Diet renal with fluid restriction Fluid restriction: 1200 mL Fluid; Room service appropriate? Yes; Fluid consistency: Thin  Diet effective now                  thin liquids Your height is:  Height: '5\' 3"'$  (160 cm) Your current weight is: Weight: 46.4 kg Your Body Mass Index  (BMI) is:  BMI (Calculated): 18.13 Following the type of diet specifically designed for you will help prevent another stroke. Your goal weight range is:   Your goal Body Mass Index (BMI) is 19-24. Healthy food habits can help reduce 3 risk factors for stroke:  High cholesterol, hypertension, and excess weight.  RESOURCES Stroke/Support Group:  Call [phone]   STROKE EDUCATION PROVIDED/REVIEWED AND GIVEN TO PATIENT Stroke warning signs and symptoms How to activate emergency medical system (call 911). Medications prescribed at discharge. Need for follow-up after discharge. Personal risk factors for stroke. Pneumonia vaccine given: No Flu vaccine given: No My questions have been answered, the writing is legible, and I understand these instructions.  I will adhere to these goals & educational materials that have been provided to me after my discharge from the hospital.      My questions have been answered and I understand these instructions. I will adhere to these goals and the provided educational materials after my discharge from the hospital.  Patient/Caregiver Signature _______________________________ Date __________  Clinician Signature _______________________________________ Date __________  Please bring this form and your medication list with you to all your follow-up doctor's appointments.

## 2022-03-02 NOTE — Progress Notes (Addendum)
PROGRESS NOTE   Subjective/Complaints:  She was seen at bedside this AM. She had just finished working with therapy.    She asks about DC date, discussed we should have more information after upcoming team meeting tomorrow. No new complaints or concerns.   LBM 10/2  ROS: Difficult to ascertain d/t severe dysarthria.   Objective:   No results found. No results for input(s): "WBC", "HGB", "HCT", "PLT" in the last 72 hours.  Recent Labs    03/01/22 0959  NA 138  K 5.1  CL 96*  CO2 28  GLUCOSE 100*  BUN 34*  CREATININE 7.22*  CALCIUM 10.5*     Intake/Output Summary (Last 24 hours) at 03/02/2022 1453 Last data filed at 03/02/2022 1308 Gross per 24 hour  Intake 836 ml  Output --  Net 836 ml         Physical Exam: Vital Signs Blood pressure (!) 160/87, pulse 72, temperature 97.8 F (36.6 C), resp. rate 16, height '5\' 3"'$  (1.6 m), weight 45 kg, SpO2 99 %. Physical Exam Constitutional: Appropriate appearance for age.  Sitting in bed.  HENT: No JVD. Neck Supple. Trachea midline. Atraumatic, normocephalic. Eyes: PERRLA. Conjugate gaze Cardiovascular: RRR, no murmurs/rub/gallops. No Edema. Peripheral pulses 2+  Respiratory: CTAB. No rales, rhonchi, or wheezing. On RA.  Abdomen: + bowel sounds, normoactive. No distention or tenderness.  GU: Not examined. Skin: C/D/I. No apparent lesions. MSK:      No apparent deformity. Chest pain not reproducible on palpation.       Strength:                RUE: 3-/5 SA, 2+/5 EF, 2+/5 EE, 3/5 WE, 3/5 FF, 3/5 FA                 LUE: 5/5                 RLE: 5/5                 LLE:  5/5   Neurologic exam:  Cognition: AAO to person, place, time and event.  Language: Severe dysarthria, difficult to understand but fluent with appropriate responses. follows commands Insight: Good insight into current condition.  CN: + R facial droop, + R tongue deviation, + R shoulder weakness;  otherwise intact        Assessment/Plan: 1. Functional deficits which require 3+ hours per day of interdisciplinary therapy in a comprehensive inpatient rehab setting. Physiatrist is providing close team supervision and 24 hour management of active medical problems listed below. Physiatrist and rehab team continue to assess barriers to discharge/monitor patient progress toward functional and medical goals  Care Tool:  Bathing    Body parts bathed by patient: Face   Body parts bathed by helper: Left arm, Right lower leg, Buttocks     Bathing assist Assist Level: Contact Guard/Touching assist     Upper Body Dressing/Undressing Upper body dressing   What is the patient wearing?: Pull over shirt    Upper body assist Assist Level: Maximal Assistance - Patient 25 - 49%    Lower Body Dressing/Undressing Lower body dressing      What is  the patient wearing?: Pants     Lower body assist Assist for lower body dressing: Contact Guard/Touching assist     Toileting Toileting    Toileting assist Assist for toileting: Moderate Assistance - Patient 50 - 74% (simulated)     Transfers Chair/bed transfer  Transfers assist     Chair/bed transfer assist level: Supervision/Verbal cueing     Locomotion Ambulation   Ambulation assist      Assist level: Contact Guard/Touching assist Assistive device: No Device Max distance: 200+   Walk 10 feet activity   Assist     Assist level: Contact Guard/Touching assist Assistive device: No Device   Walk 50 feet activity   Assist    Assist level: Contact Guard/Touching assist Assistive device: No Device    Walk 150 feet activity   Assist    Assist level: Contact Guard/Touching assist Assistive device: No Device    Walk 10 feet on uneven surface  activity   Assist     Assist level: Minimal Assistance - Patient > 75% Assistive device: Other (comment) (no device)   Wheelchair     Assist Is the  patient using a wheelchair?: Yes Type of Wheelchair: Manual    Wheelchair assist level: Contact Guard/Touching assist Max wheelchair distance: 30    Wheelchair 50 feet with 2 turns activity    Assist        Assist Level: Total Assistance - Patient < 25%   Wheelchair 150 feet activity     Assist      Assist Level: Total Assistance - Patient < 25%   Blood pressure (!) 160/87, pulse 72, temperature 97.8 F (36.6 C), resp. rate 16, height '5\' 3"'$  (1.6 m), weight 45 kg, SpO2 99 %.  Medical Problem List and Plan: 1. 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             -patient may shower             -ELOS/Goals: 10-14 days  -Continue CIR 2.  Antithrombotics: -DVT/anticoagulation:  Pharmaceutical: Eliquis             -antiplatelet therapy: none 3. Pain Management: Tylenol as needed 4. Mood/Behavior/Sleep: LCSW to evaluate and provide emotional support             -antipsychotic agents: n/a 5. Neuropsych/cognition: This patient is capable of making decisions on her own behalf. 6. Skin/Wound Care: routine skin care checks 7. Fluids/Electrolytes/Nutrition: strict Is and Os. Labs per nephrology             -dysphagia 2/thin liquids; SLP eval             - Nephrology following for dialysis; defer electrolyte management to them 8: Hypertension: monitor             -continue amlodipine 10 mg daily             -continue carvedilol 6.25 mg daily             -continue clonidine 0.1 mg patch weekly (Sat)             -continue isosorbide mononitrate 30 mg daily  -10/2 BP intermittently elevated, continue to monitor trend     03/02/2022    5:00 AM 03/02/2022    4:46 AM 03/01/2022    1:00 PM  Vitals with BMI  Weight 99 lbs 3 oz    BMI 46.28    Systolic  638 177  Diastolic  87 84  Pulse  72 82      9: Hyperlipidemia: continue Crestor 10 mg daily 10: 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 Chest pain - intermittent, L>R lower ribs, primarily on off-dialysis days       - 9/28 Tachycardia, irregular into 120s on initial exam; EKG unchanged. Troponin elevated, approx same as prior levels.  On chart review, several similar episodes while inpatient, consistently negative cardiac workup.        - 9/29 - 1x episode, vitals stable, self-resolved. Added lidocaine patch to lower chest.           - 10/1 - no episodes since dialysis yesterday; stable  10/2 continue to be improved   LOS: 4 days A FACE TO FACE EVALUATION WAS PERFORMED  Jennye Boroughs 03/02/2022, 2:53 PM

## 2022-03-02 NOTE — Progress Notes (Signed)
Occupational Therapy Session Note  Patient Details  Name: Monique Henderson MRN: [id number] Date of Birth: 03-06-1951  Today's Date: 03/02/2022 OT Individual Time: 1300-1400 OT Individual Time Calculation (min): 60 min   Short Term Goals: Week 1:  OT Short Term Goal 1 (Week 1): STG = LTG 2/2 ELOS  Skilled Therapeutic Interventions/Progress Updates:    Pt greeted seated EOB finishing lunch. OT had pt bring her R hand up to her tray to provide support to bowl while scooping ice cream. Educated on functional uses of R U E as a stabilizer. Pt then pivoted to wc with CGA. R UE NMR with standing weight bearing towel pushes-3 sets of 30. Pt ambulated short distance without AD to therapy mat and min A. Brought pt into quadruped for weight bearing through R side while reaching forward to sort playing cards. Pt needed support on R shoulder girdle with fatigue. Crawling forward on mat with pt able to advance  RUE through shoulder, but needed OT assist to keep hand extended when weight shift for weight bearing. OT placed SAEBO e-stim on wrist extensors. OT also placed soft foam in hand to encourage grasp when cycling off.Marland Kitchen SAEBO left on for 60 minutes. OT returned to remove SAEBO with skin intact and no adverse reactions.  [place] Stim One 330 pulse width 35 Hz pulse rate On 8 sec/ off 8 sec Ramp up/ down 2 sec Symmetrical Biphasic wave form  Max intensity 162m at 500 Ohm load  Pt transferred back to bed with CGA and left semi-reclined in bed with bed alarm on, call bell in reach and needs met.    Therapy Documentation Precautions:  Precautions Precautions: Fall Precaution Comments: Min R LE hemiparesis Restrictions Weight Bearing Restrictions: No Pain:  Denies pain   Therapy/Group: Individual Therapy  [name]10/07/2021, 1:28 PM

## 2022-03-02 NOTE — Progress Notes (Signed)
[facility] Progress Note   Subjective:    Seen and examined patient at bedside. Seen siting up in bed. Denies SOB, CP, and N/V. She wants to go home.  Objective Vitals:   03/01/22 0500 03/01/22 1300 03/02/22 0446 03/02/22 0500  BP:  128/84 (!) 160/87   Pulse:  82 72   Resp:  16 16   Temp:  97.6 F (36.4 C) 97.8 F (36.6 C)   TempSrc:  Oral    SpO2:  100% 99%   Weight: 45 kg   45 kg  Height:       Physical Exam General: Frail woman, NAD. Room air.  Heart: RRR; no murmur Lungs: CTAB Abdomen: soft Extremities: no LE edema Dialysis Access: R [facility] Neuro: Significant dysarthria and R arm weakness  Filed Weights   02/28/22 1849 03/01/22 0500 03/02/22 0500  Weight: 41.2 kg 45 kg 45 kg    Intake/Output Summary (Last 24 hours) at 03/02/2022 1355 Last data filed at 03/02/2022 1308 Gross per 24 hour  Intake 836 ml  Output --  Net 836 ml    Additional Objective Labs: Basic Metabolic Panel: Recent Labs  Lab 02/26/22 1456 02/27/22 1028 03/01/22 0959  NA 131* 133* 138  K 3.9 4.2 5.1  CL 96* 92* 96*  CO2 '24 26 28  '$ GLUCOSE 156* 113* 100*  BUN 15 37* 34*  CREATININE 4.56* 7.92* 7.22*  CALCIUM 9.4 11.1* 10.5*  PHOS 4.1 7.3* 7.3*   Liver Function Tests: Recent Labs  Lab 02/23/22 1444 02/26/22 1456 02/27/22 1028 03/01/22 0959  AST 44*  --  21  --   ALT 12  --  12  --   ALKPHOS 78  --  69  --   BILITOT 0.6  --  0.4  --   PROT 7.5  --  7.8  --   ALBUMIN 4.1 4.0 4.1 3.9   No results for input(s): "LIPASE", "AMYLASE" in the last 168 hours. CBC: Recent Labs  Lab 02/23/22 1846 02/26/22 0744 02/27/22 1028  WBC 4.4 3.3* 3.6*  NEUTROABS 2.8  --  1.6*  HGB 11.8* 12.0 13.4  HCT 38.7 38.0 42.6  MCV 101.6* 97.7 98.2  PLT 284 261 282   Blood Culture    Component Value Date/Time   SDES URINE, CLEAN CATCH 08/12/2021 1250   SPECREQUEST  08/12/2021 1250    NONE Performed at [facility], [place] [address] [place], [place] [address]    CULT  MULTIPLE SPECIES PRESENT, SUGGEST RECOLLECTION (A) 08/12/2021 1250   REPTSTATUS 08/15/2021 FINAL 08/12/2021 1250    Cardiac Enzymes: No results for input(s): "CKTOTAL", "CKMB", "CKMBINDEX", "TROPONINI" in the last 168 hours. CBG: Recent Labs  Lab 02/23/22 1357 02/23/22 1755  GLUCAP 106* 103*   Iron Studies: No results for input(s): "IRON", "TIBC", "TRANSFERRIN", "FERRITIN" in the last 72 hours. Lab Results  Component Value Date   INR 1.0 02/23/2022   INR 1.1 01/29/2022   INR 1.5 (H) 12/24/2021   Studies/Results: No results found.  Medications:   amLODipine  10 mg Oral Daily   apixaban  2.5 mg Oral BID   carvedilol  6.25 mg Oral BID WC   Chlorhexidine Gluconate Cloth  6 each Topical Daily   Chlorhexidine Gluconate Cloth  6 each Topical Q0600   cloNIDine  0.1 mg Transdermal Q Sat   isosorbide mononitrate  30 mg Oral Daily   lidocaine  1 patch Transdermal Q24H   rosuvastatin  10 mg Oral Daily  Dialysis Orders: TTS at [facility] 3:75 hrs [id number]/Autoflow 1.5 45 kg 2.0 K/ 2.0 Ca UFP 2 TDC no heparin - No ESA/Venofer - Hectorol 4 mcg IV TIW  Assessment/Plan: Acute cortical CVA: Dysarthric, R arm weakness.  Now in CIR. Hypertensive Emergency:  BP variable, no edema on exam or imaging.   PAF: On Eliquis (restarted 9/28) ESRD: Continue HD on TTS; next HD 10/3. Anemia: Hgb > 11, no ESA needed.  Metabolic bone disease: Ca super high on 9/29, Phos high as well, holding VDRA and repeat labs.  Nutrition - Albumin at goal. Renal diet.  Tobie Poet, NP [facility] 03/02/2022,1:55 PM  LOS: 4 days

## 2022-03-02 NOTE — Progress Notes (Signed)
Physical Therapy Session Note  Patient Details  Name: Monique Henderson MRN: [id number] Date of Birth: Nov 04, 1950  Today's Date: 03/02/2022 PT Individual Time: [phone] and 1520-1545 PT Individual Time Calculation (min): 45 min and 25 min  Short Term Goals: Week 1:  PT Short Term Goal 1 (Week 1): STG=LTG 2/2 ELOS  Skilled Therapeutic Interventions/Progress Updates:     Pt received supine in bed and agrees to therapy. No complaint of pain. Supine tot sit with cues for sequencing and positioning for safety. Pt dons shoes with setup assistance. Sit to stand and ambulatory transfer to [facility] with CGA and cues for sequencing and positioning. WC transport to gym for time management. Pt transfers to Nustep with CGA. Pt copmletes Nustep for endurance training as well as reciprocal coordination. PT ace wraps R hand to facilitate grip and provides cues for full pain free ROM. After 5:00 pt reports discomfort in R shoulder. PT provides verbal and tactile cues for scapular retraction and RTC engagement to stabilize glenohumeral joint. Pt performs for 1:00 prior to requesting to release R hand. Pt performs remainder of activity with R hand in lap. Pt requires several rest breaks during remainder of exercise. Overall pt completes x10:00 at workload of 5 decreasing to 3 with average steps per minute ~35 and multiple rest breaks.  Pt ambulates x350' without AD, requiring CGA for safety. PT provides cues throughout to challenge dynamic balance, including velocity changes, spontaneous stopping, and 180 degree turns. Pt does not have any LOBs. Following seated rest break, pt completes NMR for standing balance and single leg stance, performing alternating toe taps on orange cones, x20. Pt initially has slightly increased postural sway but with cueing on posture and increased repetitions, pt's balance improves. Activity progressed by having pt crossover midline with toe taps x20. Following seated rest break, pt progresses to  performing x2 toe taps in a row while in single leg stance on contralateral foot. Pt requires CGA/minA for activity and increased cueing for correct sequencing, but no overt LOBs. X20 total. WC transport back to room.  2nd Session: Pt received supine in bed and agrees to therapy. No complaint of pain. Bed mobility mod(I) with bed features. Pt performs sit to stand with cues for initiation. Pt ambulates to dayroom with cues for navigation. Seated rest break. Following, pt completes treadmill training for gait training on speed controlled surface to challenge dynamic balance and endurance. Pt ambulates on incline of 5 with a velocity between 0.7 and 1.31mh. Pt holds onto bars with both hands for support and PT provides cues to increase step height and bilateral stride length for safety and to decrease risk for falls. Pt ambulates for 3:00 for total of 250'. Following seated rest break, pt ambulates overground, x500', including up/down ramp, over mulch to provide unstable somatosensory input, and around obstacles in busy environment. Pt left supine in bed with all needs within reach.   Therapy Documentation Precautions:  Precautions Precautions: Fall Precaution Comments: Min R LE hemiparesis Restrictions Weight Bearing Restrictions: No   Therapy/Group: Individual Therapy  WBreck Coons PT, DPT 03/02/2022, 4:18 PM

## 2022-03-02 NOTE — Progress Notes (Signed)
Pt receives out-pt HD at [facility] on TTS. Pt arrives at 11:25 for 11:45 chair time. Will assist as needed.   Melven Sartorius Renal Navigator [phone]

## 2022-03-02 NOTE — Discharge Summary (Signed)
Physician Discharge Summary  Patient ID: Monique Henderson MRN: [id number] DOB/AGE: November 24, 1950 71 y.o.  Admit date: 02/26/2022 Discharge date: 03/08/2022  Discharge Diagnoses:  Principal Problem:   Acute cardioembolic stroke [facility]) Active problems: Functional deficits secondary to acute cardioembolic stroke Intermittent chest pain Dysphagia Hypertension Hyperlipidemia Paroxysmal atrial fibrillation Tachycardia End-stage renal disease  Discharged Condition: stable  Significant Diagnostic Studies:  Labs:  Basic Metabolic Panel: Recent Labs  Lab 03/05/22 0523 03/07/22 1452  NA 135 135  K 5.1 5.0  CL 92* 99  CO2 26 24  GLUCOSE 86 131*  BUN 92* 62*  CREATININE 10.18* 9.82*  CALCIUM 9.8 9.5  PHOS 7.6* 6.1*    CBC: Recent Labs  Lab 03/05/22 1619 03/07/22 1452  WBC 4.0 3.9*  HGB 10.5* 10.6*  HCT 32.6* 32.4*  MCV 95.9 96.7  PLT 234 242    CBG: No results for input(s): "GLUCAP" in the last 168 hours.   Brief HPI:   Monique Henderson is a 71 y.o. female who presented to the emergency department on 02/22/2022 with facial droop, slurred speech and right-sided weakness.  MRI of the brain revealed acute/subacute cortical infarct of the left posterior frontal and anterior parietal lobes.  She has a history of end-stage renal disease on hemodialysis.  She also has a history of atrial fibrillation maintained on Eliquis. She complained of chest pain on 9/28 and EKG and troponin obtained. No ACS and chest pain resolved.   Hospital Course: Monique Henderson was admitted to rehab 02/26/2022 for inpatient therapies to consist of PT, ST and OT at least three hours five days a week. Past admission physiatrist, therapy team and rehab RN have worked together to provide customized collaborative inpatient rehab. Complained of chest pain again on 9/29. VSS. No distress. Patient reported having bilateral lower rib chest wall pain when being taken off the HD machine. Nephrology following  and held VRDA due to hypercalcemia and hyperphosphatemia. Likely etiology of pruritis. No further chest pains outside of HD. Lokelma and Renvela continued per nephrology. Neuropsychology consultation on 10/4. Intermittent chest pain without indication of cardiac origin.    Blood pressures were monitored on TID basis and remained relatively stable on amlodipine, carvedilol, clonidine and isosorbide with some variance with dialysis. Some elevation in systolic BP to 623-762G, 315V at discharge. Rec: close follow-up with PCP.  Rehab course: During patient's stay in rehab weekly team conferences were held to monitor patient's progress, set goals and discuss barriers to discharge. At admission, patient required mod assist with basic self-care skills, min assist with mobility. Patient completed functional mobility within room and to bathroom with SBA and no AD. Navigated to dayroom from room with SBA and no AD.  Lacks right hand grip and max assist with placement of rolled washcloth. Patient is approximately 75% intelligible  at the phrase level.  She has had improvement in activity tolerance, balance, postural control as well as ability to compensate for deficits. She has had improvement in functional use RUE as well as improvement in awareness  Disposition: Home Discharge disposition: 01-Home or Self Care     Diet: Renal  Special Instructions: No driving, alcohol consumption or tobacco use.   Close follow-up of BP and further adjustments per PCP.  Discharge Instructions     Ambulatory referral to Neurology   Complete by: As directed    An appointment is requested in approximately: 4 weeks   Ambulatory referral to Physical Medicine Rehab   Complete by: As directed  Allergies as of 03/08/2022       Reactions   Shrimp [shellfish Allergy] Shortness Of Breath   Bactroban [mupirocin] Other (See Comments)   "Sores in nose"   Chlorhexidine Gluconate Itching   Tylenol [acetaminophen]  Itching   Zestril [lisinopril] Cough        Medication List     STOP taking these medications    mirtazapine 7.5 MG tablet Commonly known as: REMERON   Torsemide 40 MG Tabs       TAKE these medications    albuterol 108 (90 Base) MCG/ACT inhaler Commonly known as: VENTOLIN HFA Inhale 2 puffs into the lungs every 6 (six) hours as needed for wheezing or shortness of breath.   amLODipine 10 MG tablet Commonly known as: NORVASC Take 1 tablet (10 mg total) by mouth daily.   apixaban 2.5 MG Tabs tablet Commonly known as: ELIQUIS Take 1 tablet (2.5 mg total) by mouth 2 (two) times daily.   carvedilol 6.25 MG tablet Commonly known as: COREG Take 1 tablet (6.25 mg total) by mouth 2 (two) times daily with a meal.   cloNIDine 0.1 mg/24hr patch Commonly known as: CATAPRES - Dosed in mg/24 hr Place 1 patch (0.1 mg total) onto the skin every Saturday.   diphenhydrAMINE-zinc acetate cream Commonly known as: BENADRYL Apply topically 3 (three) times daily as needed for itching.   isosorbide mononitrate 30 MG 24 hr tablet Commonly known as: IMDUR Take 1 tablet (30 mg total) by mouth daily.   lidocaine 5 % Commonly known as: LIDODERM Place 1 patch onto the skin daily. Remove & Discard patch within 12 hours or as directed by MD   rosuvastatin 10 MG tablet Commonly known as: CRESTOR Take 1 tablet (10 mg total) by mouth daily.   sevelamer carbonate 2.4 g Pack Commonly known as: RENVELA Take 2.4 g by mouth 3 (three) times daily with meals. What changed:  how to take this when to take this   traZODone 50 MG tablet Commonly known as: DESYREL Take 0.5 tablets (25 mg total) by mouth at bedtime as needed for sleep.   VITAMIN D-3 PO Take 1 capsule by mouth daily.        Follow-up Information     Gertie Gowda, DO Follow up.   Specialty: Physical Medicine and Rehabilitation Why: office will call you to arrange your appt (sent) Contact information: [place] [address] [phone]         [facility] Follow up.   Why: Call in 1-2 days to make arrangements for hospital follow-up appoiontment. Contact information: [address]     [place] [address] [place] Follow up.   Why: Call in 1-2 days to make arrangements for hospital follow-up appoiontment. Contact information: [place] [address] [phone]        Dorna Mai, MD. Go to.   Specialty: Family Medicine Contact information: [address] [place] [address] [phone]                 Signed: Barbie Banner 03/11/2022, 3:40 PM

## 2022-03-02 NOTE — Progress Notes (Signed)
Speech Language Pathology Daily Session Note  Patient Details  Name: Monique Henderson MRN: [id number] Date of Birth: 25-Aug-1950  Today's Date: 03/02/2022 SLP Individual Time: 1034-1130 SLP Individual Time Calculation (min): 56 min  Short Term Goals: Week 1: SLP Short Term Goal 1 (Week 1): STG=LTG due to ELOS  Skilled Therapeutic Interventions: Skilled treatment session focused on communication and swallowing goals. Upon arrival, patient was asleep in bed but easily roused. SLP facilitated session by providing education regarding speech intelligibility strategies to utilize to maximize intelligibility. SLP utilized visual feedback with a mirror as well as overall Mod verbal cues for use of speech intelligibility strategies at the word level with minimal pairs. Intelligibility was mostly impaired due to an increased speech rate and decreased ROM of her lingual musculature with focus on placement today.  Patient had her top dentures in place but reported her sister would be bringing her bottom dentures today. SLP provided education on importance of wearing both top and bottom dentures to maximize articulatory precision as well as mastication with upgraded textures. Due to missing dentition, trials were not administered today but patient did drink thin liquids via straw without overt s/s of aspiration. Recommend patient continue current diet. Patient left upright in bed with alarm on and all needs within reach. Continue with current plan of care.      Pain Pain Assessment Pain Scale: 0-10 Pain Score: 0-No pain  Therapy/Group: Individual Therapy  Morell Mears 03/02/2022, 12:30 PM

## 2022-03-02 NOTE — Care Management (Signed)
[facility] Individual Statement of Services  Patient Name:  Monique Henderson  Date:  03/02/2022  Welcome to the [facility].  Our goal is to provide you with an individualized program based on your diagnosis and situation, designed to meet your specific needs.  With this comprehensive rehabilitation program, you will be expected to participate in at least 3 hours of rehabilitation therapies Monday-Friday, with modified therapy programming on the weekends.  Your rehabilitation program will include the following services:  Physical Therapy (PT), Occupational Therapy (OT), Speech Therapy (ST), 24 hour per day rehabilitation nursing, Therapeutic Recreaction (TR), Psychology, Neuropsychology, Care Coordinator, Rehabilitation Medicine, [place], and Other  Weekly team conferences will be held on Tuesdays to discuss your progress.  Your Inpatient Rehabilitation Care Coordinator will talk with you frequently to get your input and to update you on team discussions.  Team conferences with you and your family in attendance may also be held.  Expected length of stay: 7-10 days    Overall anticipated outcome: Independent with Assistive Device to Supervision  Depending on your progress and recovery, your program may change. Your Inpatient Rehabilitation Care Coordinator will coordinate services and will keep you informed of any changes. Your Inpatient Rehabilitation Care Coordinator's name and contact numbers are listed  below.  The following services may also be recommended but are not provided by the [facility] will be made to provide these services after discharge if needed.  Arrangements include referral to agencies that provide these services.  Your insurance has been verified  to be:  [facility] Medicare  Your primary doctor is:  No PCP  Pertinent information will be shared with your doctor and your insurance company.  Inpatient Rehabilitation Care Coordinator:  Cathleen Corti [phone] or ([phone]  Information discussed with and copy given to patient by: Rana Snare, 03/02/2022, 7:50 AM

## 2022-03-03 LAB — CBC WITH DIFFERENTIAL/PLATELET
Abs Immature Granulocytes: 0.02 10*3/uL (ref 0.00–0.07)
Basophils Absolute: 0.1 10*3/uL (ref 0.0–0.1)
Basophils Relative: 1 %
Eosinophils Absolute: 0.2 10*3/uL (ref 0.0–0.5)
Eosinophils Relative: 6 %
HCT: 35.5 % — ABNORMAL LOW (ref 36.0–46.0)
Hemoglobin: 11.7 g/dL — ABNORMAL LOW (ref 12.0–15.0)
Immature Granulocytes: 1 %
Lymphocytes Relative: 21 %
Lymphs Abs: 0.9 10*3/uL (ref 0.7–4.0)
MCH: 30.9 pg (ref 26.0–34.0)
MCHC: 33 g/dL (ref 30.0–36.0)
MCV: 93.7 fL (ref 80.0–100.0)
Monocytes Absolute: 0.6 10*3/uL (ref 0.1–1.0)
Monocytes Relative: 13 %
Neutro Abs: 2.6 10*3/uL (ref 1.7–7.7)
Neutrophils Relative %: 58 %
Platelets: 258 10*3/uL (ref 150–400)
RBC: 3.79 MIL/uL — ABNORMAL LOW (ref 3.87–5.11)
RDW: 15.1 % (ref 11.5–15.5)
WBC: 4.4 10*3/uL (ref 4.0–10.5)
nRBC: 0 % (ref 0.0–0.2)

## 2022-03-03 LAB — RENAL FUNCTION PANEL
Albumin: 3.5 g/dL (ref 3.5–5.0)
Anion gap: 20 — ABNORMAL HIGH (ref 5–15)
BUN: 87 mg/dL — ABNORMAL HIGH (ref 8–23)
CO2: 22 mmol/L (ref 22–32)
Calcium: 9.4 mg/dL (ref 8.9–10.3)
Chloride: 91 mmol/L — ABNORMAL LOW (ref 98–111)
Creatinine, Ser: 11.35 mg/dL — ABNORMAL HIGH (ref 0.44–1.00)
GFR, Estimated: 3 mL/min — ABNORMAL LOW (ref >60)
Glucose, Bld: 84 mg/dL (ref 70–99)
Phosphorus: 10.6 mg/dL — ABNORMAL HIGH (ref 2.5–4.6)
Potassium: 6.1 mmol/L — ABNORMAL HIGH (ref 3.5–5.1)
Sodium: 133 mmol/L — ABNORMAL LOW (ref 135–145)

## 2022-03-03 MED ORDER — HEPARIN SODIUM (PORCINE) 1000 UNIT/ML IJ SOLN
INTRAMUSCULAR | Status: AC
  Administered 2022-03-03: 3800 [IU]
  Filled 2022-03-03: qty 4

## 2022-03-03 MED ORDER — SEVELAMER CARBONATE 2.4 G PO PACK
2.4000 g | PACK | Freq: Three times a day (TID) | ORAL | Status: DC
  Administered 2022-03-03 – 2022-03-08 (×13): 2.4 g via ORAL
  Filled 2022-03-03 (×17): qty 1

## 2022-03-03 NOTE — Progress Notes (Signed)
Advised by CSW that pt will d/c from rehab on 10/8. Contacted [facility] to advise clinic of pt's d/c date and that pt will resume on 10/10.   Melven Sartorius Renal Navigator [phone]

## 2022-03-03 NOTE — Progress Notes (Addendum)
Patient ID: Monique Henderson, female   DOB: Apr 10, 1951, 71 y.o.   MRN: [id number]  [id number] left message for [name] providing updates from team conference, and d/c date 10/8. SW requested follow-up to discuss d/c plan, brining in dentures, and will discuss with pt preferred outpatient location. SW waiting on follow-up.  Pt at dialysis, so SW will follow-up with updates from team conference later.   *Pt son left message for SW informing that pt dentures were in bottom drawer in pink case. SW informed medical team.   SW returned phone call to [name] (goes Media planner) to discuss above. Preferred outpatient will be [facility]. SW faxed referral. SW informed there will be follow-up to schedule family education.   *SW received return phone call from [name] and her sister Monique Henderson and her sister is concerned about pt outpatient location [employer] or [employer], and also transportation needs for outpatient or dialysis. SW informed there will be follow-up to discuss further, and highly encouraged family education.   Loralee Pacas, MSW, [place] Office: [phone] Cell: [phone] Fax: [phone]

## 2022-03-03 NOTE — Progress Notes (Signed)
Occupational Therapy Session Note  Patient Details  Name: Monique Henderson MRN: [id number] Date of Birth: [birth year]  Today's Date: 03/03/2022 OT Individual Time: 0962-8366 OT Individual Time Calculation (min): 58 min    Short Term Goals: Week 1:  OT Short Term Goal 1 (Week 1): STG = LTG 2/2 ELOS  Skilled Therapeutic Interventions/Progress Updates:    Pt greeted seated EOB and agreeable to OT treatment session. Pt reported she already washed up and got dressed with nurse tech. OT educated on OT role and BADL goals, pt verbalized understanding. Worked on donning shoes at EOB with increased time and min cues for technique. Pt initiated trying to use L hand as a stabilizer without cues. OT assist to tie shoes. Pt then ambulated to therapy gym with min A and no AD. Neuro re-ed with crawling forwards and back on mat 4x. Continued neuro re-ed in quadruped reaching for squigzz placed on window.Facilitated weight shifting onto R side, then had pt reach with R UE to pull squigzz off. She was able to get there with her shoulder, but does not have grasp to pull off requiring OT assist. OT placed R hand on unweighted bar using co-band. 3 numbers plaed in front of pt and pt had to hit each number with bar and recall up to 5 number sequence. Isolated bicep curls, and shoulder raise using only R UE on unweighted bar. Incorporated PNF patterns for neuro re-ed reaching for numbers placed in all 4 corners of mirror board. Pt ambulated back to room in similar fashion and left semi-reclined in bed with bed alarm on call bell in reach and needs met.   Therapy Documentation Precautions:  Precautions Precautions: Fall Precaution Comments: Min R LE hemiparesis Restrictions Weight Bearing Restrictions: No Pain:  Denies pain at this time   Therapy/Group: Individual Therapy  Valma Cava 03/03/2022, 9:07 AM

## 2022-03-03 NOTE — Progress Notes (Signed)
Physical Therapy Session Note  Patient Details  Name: Monique Henderson MRN: [id number] Date of Birth: 05/31/1951  Today's Date: 03/03/2022 PT Individual Time: 0935-1005 PT Individual Time Calculation (min): 30 min   Short Term Goals: Week 1:  PT Short Term Goal 1 (Week 1): STG=LTG 2/2 ELOS  Skilled Therapeutic Interventions/Progress Updates: Pt handed off from OT in gym sitting at [facility].  Pt agreeable to therapy.  Pt performed standing cornhole task standing on Airex cushion and then wedge under back of foot.  Pt reaching outside of BOS to B sides w/o LOB.  Pt amb multiple trials 200' w/o AD looking for items on wall, counting pictures to challenge balance w/ need to stop x 2, as well as slight LOB, but self-correcting.  Pt returned to supine in bed w/ bed alarm on and all needs in reach.  Sister present in room.     Therapy Documentation Precautions:  Precautions Precautions: Fall Precaution Comments: Min R LE hemiparesis Restrictions Weight Bearing Restrictions: No General:   Vital Signs:  Pain: no c/o      Therapy/Group: Individual Therapy  Ladoris Gene 03/03/2022, 10:24 AM

## 2022-03-03 NOTE — Progress Notes (Signed)
Speech Language Pathology Daily Session Note  Patient Details  Name: Monique Henderson MRN: [id number] Date of Birth: 08/18/1950  Today's Date: 03/03/2022 SLP Individual Time: 5852-7782 SLP Individual Time Calculation (min): 44 min  Short Term Goals: Week 1: SLP Short Term Goal 1 (Week 1): STG=LTG due to ELOS  Skilled Therapeutic Interventions: Skilled treatment session focused on communication and dysphagia goals. Upon arrival, patient was awake in bed and reported that her family did not bring her bottom dentures as originally planned.  Despite that, patient willing to participate in trials of Dys. 3 textures. Patient with decreased oral manipulation resulting in prolonged mastication and mild right oral residue that cleared with a liquid wash. Recommend ongoing trials with SLP with use of dentures when possible. SLP also facilitated session by providing overall Mod A verbal cues for use of speech intelligibility strategies at the word and phrase level during a structured language task. Mod verbal and articulatory cues were also needed to self-monitor and correct speech errors. Suspect some aspect of motor planning deficits impacting her overall speech intelligibility as well. Overall, patient was ~75% intelligible at the phrase level. Patient left upright in bed with alarm on and all needs within reach. Continue with current plan of care.       Pain No/Denies Pain   Therapy/Group: Individual Therapy  Marwa Fuhrman 03/03/2022, 10:10 AM

## 2022-03-03 NOTE — Progress Notes (Addendum)
PROGRESS NOTE   Subjective/Complaints:  She was seen at bedside this AM. States she is going home today, has called a friend to pick her up, points to a block in her schedule from 12 pm-8 pm labelled "discharge". Discussed with SW, no plans for DC today, seems to have been confusion with dialysis timing block. Communicated this to patient and apologized.  She endorses no further chest pains outside of dialysis.   LBM 10/2, large   ROS: Denies fevers, chills, N/V, abdominal pain, constipation, diarrhea, SOB, cough, chest pain, new weakness or paraesthesias.    Objective:   No results found. Recent Labs    03/03/22 0509  WBC 4.4  HGB 11.7*  HCT 35.5*  PLT 258    Recent Labs    03/01/22 0959 03/03/22 0509  NA 138 133*  K 5.1 6.1*  CL 96* 91*  CO2 28 22  GLUCOSE 100* 84  BUN 34* 87*  CREATININE 7.22* 11.35*  CALCIUM 10.5* 9.4     Intake/Output Summary (Last 24 hours) at 03/03/2022 0952 Last data filed at 03/03/2022 0700 Gross per 24 hour  Intake 614 ml  Output --  Net 614 ml         Physical Exam: Vital Signs Blood pressure (!) 157/92, pulse 73, temperature 98.6 F (37 C), resp. rate 16, height '5\' 3"'$  (1.6 m), weight 46.1 kg, SpO2 100 %. Physical Exam Constitutional: Appropriate appearance for age.  Sitting in bed.  HENT: No JVD. Neck Supple. Trachea midline. Atraumatic, normocephalic. Eyes: PERRLA. Conjugate gaze Cardiovascular: RRR, no murmurs/rub/gallops. No Edema. Peripheral pulses 2+  Respiratory: CTAB. No rales, rhonchi, or wheezing. On RA.  Abdomen: + bowel sounds, normoactive. No distention or tenderness.  GU: Not examined. Skin: C/D/I. No apparent lesions.  Neurologic exam:  Cognition: AAO to person, place, time and event.  Language: Severe dysarthria, difficult to understand but fluent with appropriate responses. follows commands Insight: Good insight into current condition.  CN: + R  facial droop, + R tongue deviation, + R shoulder weakness; otherwise intact MSK:      No apparent deformity. Moving all 4 limbs antigravity.   PE from prior encounters:       Strength:                RUE: 3-/5 SA, 2+/5 EF, 2+/5 EE, 3/5 WE, 3/5 FF, 3/5 FA                 LUE: 5/5                 RLE: 5/5                 LLE:  5/5         Assessment/Plan: 1. Functional deficits which require 3+ hours per day of interdisciplinary therapy in a comprehensive inpatient rehab setting. Physiatrist is providing close team supervision and 24 hour management of active medical problems listed below. Physiatrist and rehab team continue to assess barriers to discharge/monitor patient progress toward functional and medical goals  Care Tool:  Bathing    Body parts bathed by patient: Face   Body parts bathed by helper: Left arm, Right  lower leg, Buttocks     Bathing assist Assist Level: Contact Guard/Touching assist     Upper Body Dressing/Undressing Upper body dressing   What is the patient wearing?: Pull over shirt    Upper body assist Assist Level: Maximal Assistance - Patient 25 - 49%    Lower Body Dressing/Undressing Lower body dressing      What is the patient wearing?: Pants     Lower body assist Assist for lower body dressing: Contact Guard/Touching assist     Toileting Toileting    Toileting assist Assist for toileting: Moderate Assistance - Patient 50 - 74% (simulated)     Transfers Chair/bed transfer  Transfers assist     Chair/bed transfer assist level: Supervision/Verbal cueing     Locomotion Ambulation   Ambulation assist      Assist level: Contact Guard/Touching assist Assistive device: No Device Max distance: 200+   Walk 10 feet activity   Assist     Assist level: Contact Guard/Touching assist Assistive device: No Device   Walk 50 feet activity   Assist    Assist level: Contact Guard/Touching assist Assistive device: No Device     Walk 150 feet activity   Assist    Assist level: Contact Guard/Touching assist Assistive device: No Device    Walk 10 feet on uneven surface  activity   Assist     Assist level: Minimal Assistance - Patient > 75% Assistive device: Other (comment) (no device)   Wheelchair     Assist Is the patient using a wheelchair?: Yes Type of Wheelchair: Manual    Wheelchair assist level: Contact Guard/Touching assist Max wheelchair distance: 30    Wheelchair 50 feet with 2 turns activity    Assist        Assist Level: Total Assistance - Patient < 25%   Wheelchair 150 feet activity     Assist      Assist Level: Total Assistance - Patient < 25%   Blood pressure (!) 157/92, pulse 73, temperature 98.6 F (37 C), resp. rate 16, height '5\' 3"'$  (1.6 m), weight 46.1 kg, SpO2 100 %.  Medical Problem List and Plan: 1. 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             -patient may shower             -ELOS/Goals: 10-14 days  -Continue CIR 2.  Antithrombotics: -DVT/anticoagulation:  Pharmaceutical: Eliquis             -antiplatelet therapy: none 3. Pain Management: Tylenol as needed            - Note: Was discharged from OP PM&R clinic 11/17/2010 by Dr. Lucia Estelle for medication noncompliance 4. Mood/Behavior/Sleep: LCSW to evaluate and provide emotional support             -antipsychotic agents: n/a 5. Neuropsych/cognition: This patient is capable of making decisions on her own behalf. 6. Skin/Wound Care: routine skin care checks 7. Fluids/Electrolytes/Nutrition: strict Is and Os. Labs per nephrology             -dysphagia 2/thin liquids; SLP eval             - Nephrology following for dialysis; defer electrolyte management to them            - 10/4  Hyperphosphatemia/Hypercalcemia: Nephrology has been  holding VDRA and trending. Phos 10+ today, discussed with them initiating Renvela, they are looking into binders. Appreciate insight and recommendations. Started low phos diet.   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 Chest pain - intermittent, L>R lower ribs, primarily on off-dialysis days       - 9/28 Tachycardia, irregular into 120s on initial exam; EKG unchanged. Troponin elevated, approx same as prior levels.  On chart review, several similar episodes while inpatient, consistently negative cardiac workup.        - 9/29 - 1x episode, vitals stable, self-resolved. Added lidocaine patch to lower chest.           - 10/1 - no episodes since dialysis yesterday; stable          - 10/2-10/3 no further episodes outside of dialysis   LOS: 5 days A FACE TO [place] 03/03/2022, 9:52 AM

## 2022-03-03 NOTE — Progress Notes (Signed)
[facility] Progress Note   Subjective:    Seen and examined patient on rehab unit while receiving therapy. She reports mild pain under L breast. Not in resp distress. Noted K+ today 6.1. She is scheduled for HD today-perform treatment sooner rather than later.  Objective Vitals:   03/02/22 1516 03/02/22 1935 03/03/22 0452 03/03/22 0500  BP: 130/85 130/79 (!) 157/92   Pulse: 75 76 73   Resp: '16 16 16   '$ Temp: 97.8 F (36.6 C) 98.5 F (36.9 C) 98.6 F (37 C)   TempSrc: Oral Oral    SpO2: 100% 99% 100%   Weight:    46.1 kg  Height:       Physical Exam General: Frail woman, NAD. Room air.  Heart: RRR; no murmur Lungs: CTAB Abdomen: soft Extremities: no LE edema Dialysis Access: R [facility] Neuro: Significant dysarthria and R arm weakness  Filed Weights   03/01/22 0500 03/02/22 0500 03/03/22 0500  Weight: 45 kg 45 kg 46.1 kg    Intake/Output Summary (Last 24 hours) at 03/03/2022 1026 Last data filed at 03/03/2022 0700 Gross per 24 hour  Intake 614 ml  Output --  Net 614 ml    Additional Objective Labs: Basic Metabolic Panel: Recent Labs  Lab 02/27/22 1028 03/01/22 0959 03/03/22 0509  NA 133* 138 133*  K 4.2 5.1 6.1*  CL 92* 96* 91*  CO2 '26 28 22  '$ GLUCOSE 113* 100* 84  BUN 37* 34* 87*  CREATININE 7.92* 7.22* 11.35*  CALCIUM 11.1* 10.5* 9.4  PHOS 7.3* 7.3* 10.6*   Liver Function Tests: Recent Labs  Lab 02/27/22 1028 03/01/22 0959 03/03/22 0509  AST 21  --   --   ALT 12  --   --   ALKPHOS 69  --   --   BILITOT 0.4  --   --   PROT 7.8  --   --   ALBUMIN 4.1 3.9 3.5   No results for input(s): "LIPASE", "AMYLASE" in the last 168 hours. CBC: Recent Labs  Lab 02/26/22 0744 02/27/22 1028 03/03/22 0509  WBC 3.3* 3.6* 4.4  NEUTROABS  --  1.6* 2.6  HGB 12.0 13.4 11.7*  HCT 38.0 42.6 35.5*  MCV 97.7 98.2 93.7  PLT 261 282 258   Blood Culture    Component Value Date/Time   SDES URINE, CLEAN CATCH 08/12/2021 1250   SPECREQUEST   08/12/2021 1250    NONE Performed at [facility], [address], [place], [place] [address]    CULT MULTIPLE SPECIES PRESENT, SUGGEST RECOLLECTION (A) 08/12/2021 1250   REPTSTATUS 08/15/2021 FINAL 08/12/2021 1250    Cardiac Enzymes: No results for input(s): "CKTOTAL", "CKMB", "CKMBINDEX", "TROPONINI" in the last 168 hours. CBG: No results for input(s): "GLUCAP" in the last 168 hours. Iron Studies: No results for input(s): "IRON", "TIBC", "TRANSFERRIN", "FERRITIN" in the last 72 hours. Lab Results  Component Value Date   INR 1.0 02/23/2022   INR 1.1 01/29/2022   INR 1.5 (H) 12/24/2021   Studies/Results: No results found.  Medications:   amLODipine  10 mg Oral Daily   apixaban  2.5 mg Oral BID   carvedilol  6.25 mg Oral BID WC   Chlorhexidine Gluconate Cloth  6 each Topical Q0600   cloNIDine  0.1 mg Transdermal Q Sat   isosorbide mononitrate  30 mg Oral Daily   lidocaine  1 patch Transdermal Q24H   rosuvastatin  10 mg Oral Daily    Dialysis Orders: TTS at  [facility] clinic 3:75 hrs [id number]/Autoflow 1.5 45 kg 2.0 K/ 2.0 Ca UFP 2 TDC no heparin - No ESA/Venofer - Hectorol 4 mcg IV TIW  Assessment/Plan: Acute cortical CVA: Dysarthric, R arm weakness.  Now in CIR. Hypertensive Emergency:  BP variable, no edema on exam or imaging.   PAF: On Eliquis (restarted 9/28) ESRD: Continue HD on TTS; HD today. Checking labs in AM. Anemia: Hgb > 11, no ESA needed.  Metabolic bone disease: Ca super high on 9/29 and holding VDRA. PO4 trending up-now 10.6. Will start Renvela powder 1 packet TID with meals per her outpatient regimen.  Nutrition - Albumin at goal. Renal diet.  Tobie Poet, NP [facility] 03/03/2022,10:26 AM  LOS: 5 days

## 2022-03-03 NOTE — Progress Notes (Signed)
Received patient in bed to unit.  Alert and oriented.  Informed consent signed and in chart.   Treatment initiated: 1149 Treatment completed: 1523  Patient tolerated well.  Transported back to the room  Alert, without acute distress.  Hand-off given to patient's nurse.   Access used: catheter Access issues: none  Total UF removed: 1000 Medication(s) given: none Post HD VS: 98, 177/106(127), HR-77, RR-20, SP02-99 Post HD weight: 43kg   [facility]

## 2022-03-04 LAB — CBC WITH DIFFERENTIAL/PLATELET
Abs Immature Granulocytes: 0.01 10*3/uL (ref 0.00–0.07)
Basophils Absolute: 0.1 10*3/uL (ref 0.0–0.1)
Basophils Relative: 2 %
Eosinophils Absolute: 0.2 10*3/uL (ref 0.0–0.5)
Eosinophils Relative: 5 %
HCT: 39.7 % (ref 36.0–46.0)
Hemoglobin: 13 g/dL (ref 12.0–15.0)
Immature Granulocytes: 0 %
Lymphocytes Relative: 19 %
Lymphs Abs: 0.6 10*3/uL — ABNORMAL LOW (ref 0.7–4.0)
MCH: 31.3 pg (ref 26.0–34.0)
MCHC: 32.7 g/dL (ref 30.0–36.0)
MCV: 95.7 fL (ref 80.0–100.0)
Monocytes Absolute: 0.5 10*3/uL (ref 0.1–1.0)
Monocytes Relative: 14 %
Neutro Abs: 1.9 10*3/uL (ref 1.7–7.7)
Neutrophils Relative %: 60 %
Platelets: 257 10*3/uL (ref 150–400)
RBC: 4.15 MIL/uL (ref 3.87–5.11)
RDW: 15.1 % (ref 11.5–15.5)
WBC: 3.1 10*3/uL — ABNORMAL LOW (ref 4.0–10.5)
nRBC: 0 % (ref 0.0–0.2)

## 2022-03-04 LAB — RENAL FUNCTION PANEL
Albumin: 3.7 g/dL (ref 3.5–5.0)
Anion gap: 15 (ref 5–15)
BUN: 51 mg/dL — ABNORMAL HIGH (ref 8–23)
CO2: 27 mmol/L (ref 22–32)
Calcium: 10.1 mg/dL (ref 8.9–10.3)
Chloride: 92 mmol/L — ABNORMAL LOW (ref 98–111)
Creatinine, Ser: 7.38 mg/dL — ABNORMAL HIGH (ref 0.44–1.00)
GFR, Estimated: 6 mL/min — ABNORMAL LOW (ref >60)
Glucose, Bld: 91 mg/dL (ref 70–99)
Phosphorus: 7.2 mg/dL — ABNORMAL HIGH (ref 2.5–4.6)
Potassium: 5.2 mmol/L — ABNORMAL HIGH (ref 3.5–5.1)
Sodium: 134 mmol/L — ABNORMAL LOW (ref 135–145)

## 2022-03-04 MED ORDER — SODIUM ZIRCONIUM CYCLOSILICATE 5 G PO PACK
5.0000 g | PACK | Freq: Once | ORAL | Status: AC
  Administered 2022-03-04: 5 g via ORAL
  Filled 2022-03-04: qty 1

## 2022-03-04 NOTE — Progress Notes (Signed)
Physical Therapy Session Note  Patient Details  Name: Monique Henderson MRN: [id number] Date of Birth: February 25, 1951  Today's Date: 03/04/2022 PT Individual Time: 5427-0623 and 7628-3151 PT Individual Time Calculation (min): 57 min and 72 min  Short Term Goals: Week 1:  PT Short Term Goal 1 (Week 1): STG=LTG 2/2 ELOS  Skilled Therapeutic Interventions/Progress Updates:     Pt received seated at EOB and agrees to therapy. No complaint of pain. Sit to stand with cues for initiation. Session focused on gait training in open environment. Pt ambulates with close supervision throughout, with cues for navigation as well as positioning for safety. Pt ambulates to elevator and manage elevator with cues for navigation. Pt ambulates through gift shop to provide balance and multitasking challenge in crowded environment with prevalent distractions. Following, pt ambulates outside for gait training over unlevel and varying surfaces. Pt ambulates x150' and takes rest break on bench with cues for safe transition to sitting. Following rest break, pt ambulates >1500', including going down steps with CGA and no hand rail, with cues for step placement, across streets with cues to look both ways for safety, up and down graded inclines, as well as several curbs. Pt takes extended seated rest break. Pt then ambulates back inside and manages elevator. PT attempts to have pt pick up 25lb kettle bell but pt is unable to do so secondary to generalized weakness. Pt ambulates additional 400' with close supervision and no instance of LOB. Left supine in bed with alarm intact and all needs within reach.  2nd Session: Pt received supine in bed asleep. Easily roused and agrees to therapy. NO complaint of pain. Supine to sit with cues for positioning at EOB. Pt performs sit to stand with cues for initiation and then ambulates x200' with cues to attend to right visual field for safety. Pt performs Nustep for strength and endurance  training. Pt completes with L upper extremity and bilateral lower extremities at worklaod of 6 for 15:00 with average steps per minute ~25-30. Cues for completing full ROM and foot placement for optimal body mechanics.   Pt completes Berg balance test with score of 49/56, significantly improving score of 25/56 on previous attempt.  Pt completes Functional Gait Assessment with several seated rest breaks, achieving score of 25/30. Pt ambulates back to room. Left seated at EOB with alarm intact and all needs within reach.  Therapy Documentation Precautions:  Precautions Precautions: Fall Precaution Comments: Min R LE hemiparesis Restrictions Weight Bearing Restrictions: No   Therapy/Group: Individual Therapy  Breck Coons, PT, DPT 03/04/2022, 5:00 PM

## 2022-03-04 NOTE — Progress Notes (Signed)
Physical Therapy Session Note  Patient Details  Name: Monique Henderson MRN: [id number] Date of Birth: 09/02/1950  Today's Date: 03/04/2022  Short Term Goals: Week 1:  PT Short Term Goal 1 (Week 1): STG=LTG 2/2 ELOS  Skilled Therapeutic Interventions/Progress Updates:     Pt misses 60 minutes of PT due to dialysis. PT will follow up as able.  Therapy Documentation Precautions:  Precautions Precautions: Fall Precaution Comments: Min R LE hemiparesis Restrictions Weight Bearing Restrictions: No    Therapy/Group: Individual Therapy  Breck Coons, PT, DPT 03/03/2022, 12:56 PM

## 2022-03-04 NOTE — Patient Care Conference (Signed)
Inpatient RehabilitationTeam Conference and Plan of Care Update Date: 03/03/2022   Time: 10:48 AM    Patient Name: Monique Henderson      Medical Record Number: [id number]  Date of Birth: 11-10-1950 Sex: Female         Room/Bed: 4W22C/4W22C-01 Payor Info: Payor: [employer] MEDICARE / Plan: [facility] MEDICARE / Product Type: *No Product type* /    Admit Date/Time:  02/26/2022  6:39 PM  Primary Diagnosis:  Acute cardioembolic stroke [facility])  Hospital Problems: Principal Problem:   Acute cardioembolic stroke [facility])    Expected Discharge Date: Expected Discharge Date: 03/08/22  Team Members Present: Physician leading conference: Other (comment) (Dr. Tressa Busman) Social Worker Present: Loralee Pacas, [place] Nurse Present: Dorien Chihuahua, RN PT Present: Tereasa Coop, PT OT Present: Cherylynn Ridges, OT SLP Present: Weston Anna, SLP     Current Status/Progress Goal Weekly Team Focus  Bowel/Bladder   Continent of B/B. LBM: 10/2    Pt is oliguric (Dialysis pt)  Offer/Assist w/ toileting q 2-4 hrs & as needed; To remain continent of b/b  Assist toileting needs q shift & PRN   Swallow/Nutrition/ Hydration   Dys. 2 textures with thin liquids, Supervision  Supervision  trials of upgraded textures, use of swallowing compensatory strategies   ADL's   CGA A overall, Min A BADL tasks overall  Supervision overall  self-care retraining, R UE NMR, NMES, balance, activity tolerance   Mobility   mod(I) bed mobility, supervision transfers, gait >300' without AD, berg 25/56  Supervision to Mod(I)  high level balance, family ed, DC prep   Communication   Mod-Max A  Min A  use of speech intelligibility strategies, self-monitoring and correcting communication breakdowns   Safety/Cognition/ Behavioral Observations  Min-Mod A  Min A  functional problem solving and recall with use of strategies   Pain   Pt denies pain.  Remain pain free.  Assist pain q shift & PRN   Skin   Skin intact.  Remain free from  any skin breakdown or skin issues.  Assist skin q shift & PRN     Discharge Planning:    Discharging home with son; HD T, H, Sa  Team Discussion: Patient with dysarthria, apraxia, expressive and receptive aphasia with right hemiplegia post CVA. Progress limited by self limiting behavior, intermittent chest pain associated with HD days. Patient on target to meet rehab goals: yes, currently toileting with CGA and completes upper body care with min assist but needs mod assist for lower body care. Supervision overall with PT; working on high level activities.   *See Care Plan and progress notes for long and short-term goals.   Revisions to Treatment Plan:  E-stim D3 diet trials; low phosphorus diet  Teaching Needs: Safety, dietary modifications, transfers, toileting, medications, skin care, etc.  Current Barriers to Discharge: Decreased caregiver support and Hemodialysis  Possible Resolutions to Barriers: Family education OP SLP follow up services     Medical Summary Current Status: medically complex, but stable  Barriers to Discharge: Behavior;Decreased family/caregiver support;Hemodialysis;Home enviroment access/layout;Medical stability;Nutrition means  Barriers to Discharge Comments: Electrolyte imbalances, dysarthria, impulsivity, intermittent chest pain Possible Resolutions to [employer] Focus: Continue working with therapies, dialysis and medication adjustments for electrolytes, pain management   Continued Need for Acute Rehabilitation Level of Care: The patient requires daily medical management by a physician with specialized training in physical medicine and rehabilitation for the following reasons: Direction of a multidisciplinary physical rehabilitation program to maximize functional independence : Yes Medical management of  patient stability for increased activity during participation in an intensive rehabilitation regime.: Yes Analysis of laboratory values and/or  radiology reports with any subsequent need for medication adjustment and/or medical intervention. : Yes   I attest that I was present, lead the team conference, and concur with the assessment and plan of the team.   Dorien Chihuahua B 03/04/2022, 3:49 PM

## 2022-03-04 NOTE — Progress Notes (Signed)
PROGRESS NOTE   Subjective/Complaints:  She was seen at bedside this AM. No complaints. No events overnight.  Per nephrology, receiving Lokelma today, imrpoved phos with Renvela.   She endorses no further chest pains outside of dialysis.   LBM 10/2, large   ROS: Denies fevers, chills, N/V, abdominal pain, constipation, diarrhea, SOB, cough, chest pain, new weakness or paraesthesias.    Objective:   No results found. Recent Labs    03/03/22 0509 03/04/22 0705  WBC 4.4 3.1*  HGB 11.7* 13.0  HCT 35.5* 39.7  PLT 258 257    Recent Labs    03/03/22 0509 03/04/22 0705  NA 133* 134*  K 6.1* 5.2*  CL 91* 92*  CO2 22 27  GLUCOSE 84 91  BUN 87* 51*  CREATININE 11.35* 7.38*  CALCIUM 9.4 10.1     Intake/Output Summary (Last 24 hours) at 03/04/2022 1251 Last data filed at 03/03/2022 1530 Gross per 24 hour  Intake --  Output 1000 ml  Net -1000 ml         Physical Exam: Vital Signs Blood pressure (!) 155/89, pulse 74, temperature 98.7 F (37.1 C), resp. rate 16, height '5\' 3"'$  (1.6 m), weight 44.5 kg, SpO2 100 %. Physical Exam Constitutional: Appropriate appearance for age.  Sitting in bed.  HENT: No JVD. Neck Supple. Trachea midline. Atraumatic, normocephalic. Eyes: PERRLA. Conjugate gaze Cardiovascular: RRR, no murmurs/rub/gallops. No Edema. Peripheral pulses 2+  Respiratory: CTAB. No rales, rhonchi, or wheezing. On RA.  Abdomen: + bowel sounds, normoactive. No distention or tenderness.  GU: Not examined. Skin: C/D/I. No apparent lesions.  Neurologic exam:  Cognition: AAO to person, place, time and event.  Language: Severe dysarthria, fluent. follows commands Insight: Good insight into current condition.  CN: + R facial droop, + R tongue deviation, + R shoulder weakness; otherwise intact MSK:      No apparent deformity. Moving all 4 limbs antigravity.   PE from prior encounters:       Strength:                 RUE: 3-/5 SA, 2+/5 EF, 2+/5 EE, 3/5 WE, 3/5 FF, 3/5 FA                 LUE: 5/5                 RLE: 5/5                 LLE:  5/5         Assessment/Plan: 1. Functional deficits which require 3+ hours per day of interdisciplinary therapy in a comprehensive inpatient rehab setting. Physiatrist is providing close team supervision and 24 hour management of active medical problems listed below. Physiatrist and rehab team continue to assess barriers to discharge/monitor patient progress toward functional and medical goals  Care Tool:  Bathing    Body parts bathed by patient: Face   Body parts bathed by helper: Left arm, Right lower leg, Buttocks     Bathing assist Assist Level: Contact Guard/Touching assist     Upper Body Dressing/Undressing Upper body dressing   What is the patient wearing?: Pull over shirt  Upper body assist Assist Level: Maximal Assistance - Patient 25 - 49%    Lower Body Dressing/Undressing Lower body dressing      What is the patient wearing?: Pants     Lower body assist Assist for lower body dressing: Contact Guard/Touching assist     Toileting Toileting    Toileting assist Assist for toileting: Moderate Assistance - Patient 50 - 74% (simulated)     Transfers Chair/bed transfer  Transfers assist     Chair/bed transfer assist level: Supervision/Verbal cueing     Locomotion Ambulation   Ambulation assist      Assist level: Supervision/Verbal cueing Assistive device: No Device Max distance: 200+   Walk 10 feet activity   Assist     Assist level: Supervision/Verbal cueing Assistive device: No Device   Walk 50 feet activity   Assist    Assist level: Supervision/Verbal cueing Assistive device: No Device    Walk 150 feet activity   Assist    Assist level: Supervision/Verbal cueing Assistive device: No Device    Walk 10 feet on uneven surface  activity   Assist     Assist level: Minimal  Assistance - Patient > 75% Assistive device: Other (comment) (no device)   Wheelchair     Assist Is the patient using a wheelchair?: Yes Type of Wheelchair: Manual    Wheelchair assist level: Contact Guard/Touching assist Max wheelchair distance: 30    Wheelchair 50 feet with 2 turns activity    Assist        Assist Level: Total Assistance - Patient < 25%   Wheelchair 150 feet activity     Assist      Assist Level: Total Assistance - Patient < 25%   Blood pressure (!) 155/89, pulse 74, temperature 98.7 F (37.1 C), resp. rate 16, height '5\' 3"'$  (1.6 m), weight 44.5 kg, SpO2 100 %.  Medical Problem List and Plan: 1. 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             -patient may shower             -ELOS/Goals: 10-14 days  -Continue CIR 2.  Antithrombotics: -DVT/anticoagulation:  Pharmaceutical: Eliquis             -antiplatelet therapy: none 3. Pain Management: Tylenol as needed            - Note: Was discharged from [facility] PM&R clinic 11/17/2010 by Dr. Lucia Estelle for medication noncompliance 4. Mood/Behavior/Sleep: LCSW to evaluate and provide emotional support             -antipsychotic agents: n/a 5. Neuropsych/cognition: This patient is capable of making decisions on her own behalf. 6. Skin/Wound Care: routine skin care checks 7. Fluids/Electrolytes/Nutrition: strict Is and Os. Labs per nephrology             -dysphagia 2/thin liquids; SLP eval             - Nephrology following for dialysis; defer electrolyte management to them            - 10/3 Hyperphosphatemia/Hypercalcemia: Nephrology has been holding VDRA and trending. Phos 10+ today, discussed with them initiating Renvela, they are looking into binders. Appreciate insight and recommendations. Started low phos diet.             - 10/4 Lokelma and Renvela  per Nephrology. Appreciate management.   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 Chest pain - intermittent, L>R lower ribs, primarily on off-dialysis days       - 9/28 Tachycardia, irregular into 120s on initial exam; EKG unchanged. Troponin elevated, approx same as prior levels.  On chart review, several similar episodes while inpatient, consistently negative cardiac workup.        - 9/29 - 1x episode, vitals stable, self-resolved. Added lidocaine patch to lower chest.           - 10/1 - no episodes since dialysis yesterday; stable          - 10/2-10/3 no further episodes outside of dialysis   LOS: 6 days A FACE TO [place] 03/04/2022, 12:51 PM

## 2022-03-04 NOTE — Progress Notes (Signed)
Occupational Therapy Session Note  Patient Details  Name: Monique Henderson MRN: [id number] Date of Birth: [birth year]  Today's Date: 03/04/2022 OT Individual Time: 8638-1771 OT Individual Time Calculation (min): 75 min    Short Term Goals: Week 1:  OT Short Term Goal 1 (Week 1): STG = LTG 2/2 ELOS  Skilled Therapeutic Interventions/Progress Updates:    Patient agreeable to participate in OT session. Reports 0/10 pain level.   Patient participated in skilled OT session focusing on ADL re-training, functional mobility within room, toileting, and RUE NM re-ed. Therapist facilitated RUE strength and ROM during session in order to improve ability to use RUE for stabilizer during self care tasks. Pt completed functional mobility  within room and to bathroom with SBA and no AD. Toileting completed at mod I level. Pr completed grooming standing at sink with SBA. Able to navigate from room to Day room with SBA and no AD. - RUE AA/ROM and GMC while seated with pt's forearm resting on pillowcase to decrease friction from table. Facilitated elbow flexion/extension, horizontal abduction/adduction, protraction/retration; 10X each, 1 set - RUE AA/ROM, strengthening and GMC with pt maintaining grasp of rolled washcloth with ace wrap used to maintained due to lack of hand strength to demonstrate ability to grasp with right hand (Max assist with placement) high reps hitting rolled ankle weights (1.0, 1.5 & 2.5lb used) for increased intensity. Placement of targets adjusted to facilitate elbow extension, IR/er, and horizontal abduction/adduction. Improved A/ROM noted during task.    Therapy Documentation Precautions:  Precautions Precautions: Fall Precaution Comments: Min R LE hemiparesis Restrictions Weight Bearing Restrictions: No   Therapy/Group: Individual Therapy  Ailene Ravel, OTR/L,CBIS  Supplemental OT - [facility] and [facility]  03/04/2022, 7:56 AM

## 2022-03-04 NOTE — Progress Notes (Addendum)
[facility] Progress Note   Subjective:    Seen and examined patient at bedside. Seen sitting up at side of bed talking on phone. Tolerated yesterday's HD with net UF 1L. K+ 5.2-ordered low-dose Lokelma today. Next HD 10/5.  Objective Vitals:   03/03/22 1622 03/03/22 1953 03/04/22 0500 03/04/22 0525  BP: (!) 154/93 129/89  (!) 155/89  Pulse: 83 80  74  Resp: '15 15  16  '$ Temp: 97.8 F (36.6 C) 98.7 F (37.1 C)  98.7 F (37.1 C)  TempSrc:      SpO2: 99% 100%  100%  Weight:   44.5 kg   Height:       Physical Exam General: Frail woman, NAD. Room air.  Heart: RRR; no murmur Lungs: CTAB Abdomen: soft Extremities: no LE edema Dialysis Access: R [facility] Neuro: Significant dysarthria and R arm weakness  Filed Weights   03/03/22 1125 03/03/22 1545 03/04/22 0500  Weight: 44 kg 43 kg 44.5 kg    Intake/Output Summary (Last 24 hours) at 03/04/2022 1147 Last data filed at 03/03/2022 1530 Gross per 24 hour  Intake --  Output 1000 ml  Net -1000 ml    Additional Objective Labs: Basic Metabolic Panel: Recent Labs  Lab 03/01/22 0959 03/03/22 0509 03/04/22 0705  NA 138 133* 134*  K 5.1 6.1* 5.2*  CL 96* 91* 92*  CO2 '28 22 27  '$ GLUCOSE 100* 84 91  BUN 34* 87* 51*  CREATININE 7.22* 11.35* 7.38*  CALCIUM 10.5* 9.4 10.1  PHOS 7.3* 10.6* 7.2*   Liver Function Tests: Recent Labs  Lab 02/27/22 1028 03/01/22 0959 03/03/22 0509 03/04/22 0705  AST 21  --   --   --   ALT 12  --   --   --   ALKPHOS 69  --   --   --   BILITOT 0.4  --   --   --   PROT 7.8  --   --   --   ALBUMIN 4.1 3.9 3.5 3.7   No results for input(s): "LIPASE", "AMYLASE" in the last 168 hours. CBC: Recent Labs  Lab 02/26/22 0744 02/27/22 1028 03/03/22 0509 03/04/22 0705  WBC 3.3* 3.6* 4.4 3.1*  NEUTROABS  --  1.6* 2.6 1.9  HGB 12.0 13.4 11.7* 13.0  HCT 38.0 42.6 35.5* 39.7  MCV 97.7 98.2 93.7 95.7  PLT 261 282 258 257   Blood Culture    Component Value Date/Time   SDES URINE,  CLEAN CATCH 08/12/2021 1250   SPECREQUEST  08/12/2021 1250    NONE Performed at [facility] [address], [place], [place] [address]    CULT MULTIPLE SPECIES PRESENT, SUGGEST RECOLLECTION (A) 08/12/2021 1250   REPTSTATUS 08/15/2021 FINAL 08/12/2021 1250    Cardiac Enzymes: No results for input(s): "CKTOTAL", "CKMB", "CKMBINDEX", "TROPONINI" in the last 168 hours. CBG: No results for input(s): "GLUCAP" in the last 168 hours. Iron Studies: No results for input(s): "IRON", "TIBC", "TRANSFERRIN", "FERRITIN" in the last 72 hours. Lab Results  Component Value Date   INR 1.0 02/23/2022   INR 1.1 01/29/2022   INR 1.5 (H) 12/24/2021   Studies/Results: No results found.  Medications:   amLODipine  10 mg Oral Daily   apixaban  2.5 mg Oral BID   carvedilol  6.25 mg Oral BID WC   Chlorhexidine Gluconate Cloth  6 each Topical Q0600   cloNIDine  0.1 mg Transdermal Q Sat   isosorbide mononitrate  30 mg Oral Daily  lidocaine  1 patch Transdermal Q24H   rosuvastatin  10 mg Oral Daily   sevelamer carbonate  2.4 g Oral TID WC   sodium zirconium cyclosilicate  5 g Oral Once    Dialysis Orders: TTS at [facility] 3:75 hrs [id number]/Autoflow 1.5 45 kg 2.0 K/ 2.0 Ca UFP 2 TDC no heparin - No ESA/Venofer - Hectorol 4 mcg IV TIW  Assessment/Plan: Acute cortical CVA: Dysarthric, R arm weakness.  Now in CIR. Hypertensive Emergency:  BP variable, no edema on exam or imaging.  PAF: On Eliquis (restarted 9/28) ESRD: Continue HD on TTS; Next HD 10/5. K+ 5.2 today so will give low-dose Lokelma. She's been under her EDW. Will lower at dc. Anemia: Hgb 13, no ESA needed. Metabolic bone disease: Ca super high on 9/29 and holding VDRA. PO4 trending back down-now 7.2. Continue Renvela powder 1 packet TID with meals per her outpatient regimen. Nutrition - Albumin at goal. Renal diet.  Tobie Poet, NP [facility] 03/04/2022,11:47 AM  LOS: 6 days

## 2022-03-04 NOTE — Consult Note (Signed)
Neuropsychological Consultation   Patient:   Monique Henderson   DOB:   [birth year]  MR Number:  [id number]  Location:  [employer] [facility] [place] [id number] [place] [address] Dept: [facility]: [phone]           Date of Service:   03/04/2022  Start Time:   1 PM End Time:   2 PM  Provider/Observer:  Ilean Skill, Psy.D.       Clinical Neuropsychologist       Billing Code/Service: 24401  Chief Complaint:    [name] is a 71 year old female who had developed sudden onset of facial droop, slurred speech and right-sided weakness on the morning of 02/22/2022.  Patient presented to [facility] ED via EMS 02/23/2022 and code stroke was activated.  Consult for neurology with Dr. Leonie Man being consulted.  MRI brain revealed acute/subacute cortical infarct of the left posterior frontal and anterior parietal lobes including central sulcus and additional focal acute/subacute infarcts involving the caudate head.  Patient with past medical history including end-stage renal disease on intermittent hemodialysis and nephrology was consulted and has been following her since.  Patient also with past history of thoracic aortic aneurysm repair, anterior lumbar fusion and breast cancer.  Following therapy evaluations patient was admitted to [facility] for intensive inpatient rehabilitation for ongoing functional deficits secondary to cerebrovascular event.  Reason for Service:  Patient was referred for neuropsychological consult due to coping and adjustment issues in the setting of significant kidney dysfunction and an acute stroke on 02/21/2022 with residual motor deficits.  Below is the HPI for the current admission.  HPI: Monique Henderson is a 71 year old female who developed sudden onset of facial droop, slurred speech and right-sided weakness on the morning of 02/22/2022.  She presented to [facility] emergency department  via EMS 9/25 and code stroke was activated.  She was seen in consultation by Dr. Leonie Man.  Cup included MRI of the brain which revealed acute/subacute cortical infarct of the left posterior frontal and anterior parietal lobes including the central sulcus and additional focal acute/subacute infarct involving the caudate head.  She has a history of paroxysmal atrial fibrillation on Eliquis.  MR angiogram of neck and brain without large vessel stenosis.  Concerned about possible fibromuscular dysplasia involving distal ICAs but no evidence of hemodynamically significant stenoses.  Her history also includes end-stage renal disease on intermittent hemodialysis and nephrology was consulted.  She is tolerating dysphagia 2 diet with thin liquids. 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   Past medical history includes thoracic aortic aneurysm repair, anterior lumbar fusion, breast cancer.   Patient is alert and oriented to place, name, year and situation. I asked her about her code status and she currently is not wearing a DNR bracelet. She indicates to me that she desires chest compression, shock and intubation in the event of code blue. I have changed her code status to full code.   Of note, at time of admission exam, patient periodically sat up in bed and clutched her left chest for 1-2 minutes, endorsing a cramping/aching pain, then would return to laying in bed. She states this has occurred in the past. Nursing called in to assess patient, called rapid response and EKG obtained showing tachycardia 110s with prior existing bundle branch black and no new STE noted. Plan of care  determined to be pain control and troponin trending. No further action required.   Current Status:  Patient was soundly asleep when I entered the room requiring some verbal cues and prompting to awaken.   Patient remained drowsy throughout our visit.  Patient has had dialysis this morning likely playing a role in her somnolence/lethargy.  Patient clearly had dysarthric speech with some word finding difficulties and articulation deficits noted.  Patient was able to follow commands and was oriented but with significant slowed information processing speed and focus execute/attentional deficits.  Patient denied any severe or significant depressive symptoms and reports that overall she is coping well given the circumstances.  Behavioral Observation: Monique Henderson  presents as a 71 y.o.-year-old Right handed African American Female who appeared her stated age. her dress was Appropriate and she was Well Groomed and her manners were Appropriate to the situation.  her participation was indicative of Appropriate behaviors.  There were physical disabilities noted.  she displayed an appropriate level of cooperation and motivation.     Interactions:    Minimal Drowsy and Inattentive  Attention:   abnormal and attention span appeared shorter than expected for age  Memory:   not examined;   Visuo-spatial:  not examined  Speech (Volume):  low  Speech:   garbled; slurred  Thought Process:  Coherent and Relevant  Though Content:  WNL; not suicidal and not homicidal  Orientation:   person, place, and situation  Judgment:   Fair  Planning:   Poor  Affect:    Appropriate  Mood:    Dysphoric  Insight:   Fair  Intelligence:   normal   Medical History:   Past Medical History:  Diagnosis Date   AF (paroxysmal atrial fibrillation) ([place]) 05/29/2019   on Coumadin   Aortic atherosclerosis ([place]) 07/05/2019   Aortic dissection ([place]) 04/04/2019   s/p repair   Bone spur 2008   Right calcaneal foot spur   Breast cancer ([place]) 2004   Ductal carcinoma in situ of the left breast; S/P left partial mastectomy 02/26/2003; S/P re-excision of cranial and lateral margins11/18/2004.radiation   Cerebral  thrombosis with cerebral infarction 05/22/2019   Chronic low back pain 06/22/2016   Chronic obstructive lung disease ([place]) 01/16/2017   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 Autumn Messing; S/P re-excision of superior and inferior margins 10/27/2012.    ESRD on hemodialysis ([place]) 05/29/2019   Essential hypertension 09/16/2006   GERD 09/16/2006   Hepatitis C    treated and RNA confirmed not detectable 01/2017   Insomnia 03/14/2015   Malnutrition of moderate degree 05/19/2019   Non compliance with medical treatment 12/04/2017   Normocytic anemia    With thrombocytosis   Osteoarthritis    Right ureteral stone 2002   S/P lumbar spinal fusion 01/18/2014   S/P lumbar decompressive laminectomy, fusion, and plating for lumbar spinal stenosis on 05/27/2009 by Dr. Eustace Moore.  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 Eustace Moore.     Tobacco use disorder 04/19/2009   Uterine fibroid    Wears dentures    top         Patient Active Problem List   Diagnosis Date Noted   Acute cardioembolic stroke ([place]) 53/97/6734   Stroke (cerebrum) ([place]) 02/23/2022  Stroke ([place]) 02/23/2022   Hypervolemia associated with renal insufficiency 01/29/2022   Dyslipidemia 01/29/2022   DNR (do not resuscitate) 01/29/2022   Polysubstance abuse ([place]) 01/06/2022   Acute on chronic combined systolic and diastolic CHF (congestive heart failure) ([place]) 01/06/2022   Abdominal pain 48/27/0786   Acute metabolic encephalopathy 75/44/9201   Malignant HTN with heart disease, w/o CHF, 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 12/24/2021   History of hepatitis C 12/08/2021   Hypertensive urgency 10/29/2021   Fluid overload 10/29/2021   Acute on chronic HFrEF (heart failure with reduced  ejection fraction) ([place]) 10/29/2021   Chronic anticoagulation 10/29/2021   Prolonged QT interval 10/29/2021   Leukopenia 10/29/2021   SOB (shortness of breath) 09/15/2021   Hypertensive emergency 08/23/2021   Altered mental status    ESRD (end stage renal disease) on dialysis with hyperkalemia and metabolic acidosis with elevated AG  08/12/2021   Malignant hypertension 06/19/2021   Anemia 06/05/2021   Pure hypercholesterolemia 02/14/2021   Acute pulmonary edema ([place]) 12/23/2020   Acute respiratory failure with hypoxia ([place]) 09/21/2020   Acute renal failure superimposed on chronic kidney disease ([place]) 09/21/2020   Community acquired pneumonia 09/21/2020   Uremia 00/71/2197   Metabolic acidosis with increased anion gap and accumulation of organic acids 09/21/2020   Hypertensive crisis 09/21/2020   Troponin level elevated 08/28/2019   Positive D dimer 08/28/2019   Hyperkalemia 08/28/2019   SVT (supraventricular tachycardia) 08/28/2019   SIRS (systemic inflammatory response syndrome) ([place]) 08/27/2019   Paroxysmal SVT (supraventricular tachycardia) 08/27/2019   Aortic atherosclerosis ([place]) 07/05/2019   Chest pain 07/05/2019   History of CVA (cerebrovascular accident) 05/29/2019   AF (paroxysmal atrial fibrillation) ([place]) 05/29/2019   ESRD on dialysis ([place]) 05/29/2019   Cerebral thrombosis with cerebral infarction 05/22/2019   Malnutrition of moderate degree 05/19/2019   Pleural effusion 05/18/2019   Atrial fibrillation with RVR ([place]) 05/18/2019   S/P aortic aneurysm repair 04/07/2019   Aortic dissection ([place]) 04/04/2019   Dissection of aorta ([place]) 04/03/2019   Non compliance with medical treatment 12/04/2017   Chronic obstructive lung disease ([place]) 01/16/2017   Chronic low back pain 06/22/2016   Insomnia 03/14/2015   S/P lumbar spinal fusion 01/18/2014   Tobacco use disorder 04/19/2009   GERD 09/16/2006     Psychiatric History:  No prior psychiatric history noted  Family  Med/Psych History:  Family History  Problem Relation Age of Onset   Colon cancer Mother 9   Hypertension Mother    Diabetes Sister 8   Hypertension Sister    Diabetes Brother    Hypertension Brother    Diabetes Brother    Hypertension Brother    Kidney disease Son        s/p renal transplant   Hypertension Son    Diabetes Son    Multiple sclerosis Son    Bone cancer Sister 67   Breast cancer Neg Hx    Cervical cancer Neg Hx     Impression/DX:  Monique Henderson is a 71 year old female who had developed sudden onset of facial droop, slurred speech and right-sided weakness on the morning of 02/22/2022.  Patient presented to [facility] ED via EMS 02/23/2022 and code stroke was activated.  Consult for neurology with Dr. Leonie Man being consulted.  MRI brain revealed acute/subacute cortical infarct of the left posterior frontal and anterior parietal lobes including central sulcus and additional focal acute/subacute infarcts involving the caudate head.  Patient with past medical history including end-stage renal disease on intermittent hemodialysis  and nephrology was consulted and has been following her since.  Patient also with past history of thoracic aortic aneurysm repair, anterior lumbar fusion and breast cancer.  Following therapy evaluations patient was admitted to [facility] for intensive inpatient rehabilitation for ongoing functional deficits secondary to cerebrovascular event.  Patient was soundly asleep when I entered the room requiring some verbal cues and prompting to awaken.  Patient remained drowsy throughout our visit.  Patient has had dialysis this morning likely playing a role in her somnolence/lethargy.  Patient clearly had dysarthric speech with some word finding difficulties and articulation deficits noted.  Patient was able to follow commands and was oriented but with significant slowed information processing speed and focus execute/attentional deficits.  Patient denied any severe or  significant depressive symptoms and reports that overall she is coping well given the circumstances.  Disposition/Plan:  It was difficult to get a lot done today as the patient was very drowsy throughout visit.  While the patient has significant expressive language deficits she was able to provide enough response to understand her responses themselves and explaining her status.  Significant expressive language deficits are persistent with patient continues to see speech therapy.           Electronically Signed   _______________________ Ilean Skill, Psy.D. Clinical Neuropsychologist

## 2022-03-05 LAB — CBC
HCT: 32.6 % — ABNORMAL LOW (ref 36.0–46.0)
Hemoglobin: 10.5 g/dL — ABNORMAL LOW (ref 12.0–15.0)
MCH: 30.9 pg (ref 26.0–34.0)
MCHC: 32.2 g/dL (ref 30.0–36.0)
MCV: 95.9 fL (ref 80.0–100.0)
Platelets: 234 10*3/uL (ref 150–400)
RBC: 3.4 MIL/uL — ABNORMAL LOW (ref 3.87–5.11)
RDW: 15 % (ref 11.5–15.5)
WBC: 4 10*3/uL (ref 4.0–10.5)
nRBC: 0 % (ref 0.0–0.2)

## 2022-03-05 LAB — RENAL FUNCTION PANEL
Albumin: 3.4 g/dL — ABNORMAL LOW (ref 3.5–5.0)
Anion gap: 17 — ABNORMAL HIGH (ref 5–15)
BUN: 92 mg/dL — ABNORMAL HIGH (ref 8–23)
CO2: 26 mmol/L (ref 22–32)
Calcium: 9.8 mg/dL (ref 8.9–10.3)
Chloride: 92 mmol/L — ABNORMAL LOW (ref 98–111)
Creatinine, Ser: 10.18 mg/dL — ABNORMAL HIGH (ref 0.44–1.00)
GFR, Estimated: 4 mL/min — ABNORMAL LOW (ref >60)
Glucose, Bld: 86 mg/dL (ref 70–99)
Phosphorus: 7.6 mg/dL — ABNORMAL HIGH (ref 2.5–4.6)
Potassium: 5.1 mmol/L (ref 3.5–5.1)
Sodium: 135 mmol/L (ref 135–145)

## 2022-03-05 MED ORDER — HYDRALAZINE HCL 10 MG PO TABS: 10.0000 mg | ORAL_TABLET | Freq: Four times a day (QID) | ORAL | Status: DC | PRN

## 2022-03-05 MED ORDER — HEPARIN SODIUM (PORCINE) 1000 UNIT/ML IJ SOLN
INTRAMUSCULAR | Status: AC
  Administered 2022-03-05: 1000 [IU]
  Filled 2022-03-05: qty 4

## 2022-03-05 MED ORDER — DIPHENHYDRAMINE-ZINC ACETATE 2-0.1 % EX CREA
TOPICAL_CREAM | Freq: Three times a day (TID) | CUTANEOUS | Status: DC | PRN
  Administered 2022-03-06: 1 via TOPICAL
  Filled 2022-03-05: qty 28

## 2022-03-05 NOTE — Progress Notes (Signed)
[facility] Progress Note   Subjective:    Seen and examined at bedside. Patient's sister also at bedside. Currently, sitting up in bed eating lunch. She reports pain under R breast with inhalation. She denies recent fall/injury. On RA with stable saturations. On Lidocaine patch. Also c/o itchiness-noted topical Benadryl ordered. Plan for HD today.  Objective Vitals:   03/04/22 1342 03/04/22 1951 03/05/22 0326 03/05/22 0450  BP: 119/67 (!) 163/88 (!) 178/104   Pulse: 74 76 73   Resp: '14 20 20   '$ Temp: 97.7 F (36.5 C) 98.4 F (36.9 C) 97.8 F (36.6 C)   TempSrc: Oral Oral Oral   SpO2: 100% 100% 100%   Weight:    42.3 kg  Height:       Physical Exam General: Frail woman, NAD. Room air.  Heart: RRR; no murmur Lungs: CTAB Abdomen: soft Extremities: no LE edema Dialysis Access: R [facility] Neuro: Significant dysarthria and R arm weakness  Filed Weights   03/03/22 1545 03/04/22 0500 03/05/22 0450  Weight: 43 kg 44.5 kg 42.3 kg    Intake/Output Summary (Last 24 hours) at 03/05/2022 1348 Last data filed at 03/05/2022 0828 Gross per 24 hour  Intake 118 ml  Output --  Net 118 ml    Additional Objective Labs: Basic Metabolic Panel: Recent Labs  Lab 03/03/22 0509 03/04/22 0705 03/05/22 0523  NA 133* 134* 135  K 6.1* 5.2* 5.1  CL 91* 92* 92*  CO2 '22 27 26  '$ GLUCOSE 84 91 86  BUN 87* 51* 92*  CREATININE 11.35* 7.38* 10.18*  CALCIUM 9.4 10.1 9.8  PHOS 10.6* 7.2* 7.6*   Liver Function Tests: Recent Labs  Lab 02/27/22 1028 03/01/22 0959 03/03/22 0509 03/04/22 0705 03/05/22 0523  AST 21  --   --   --   --   ALT 12  --   --   --   --   ALKPHOS 69  --   --   --   --   BILITOT 0.4  --   --   --   --   PROT 7.8  --   --   --   --   ALBUMIN 4.1   < > 3.5 3.7 3.4*   < > = values in this interval not displayed.   No results for input(s): "LIPASE", "AMYLASE" in the last 168 hours. CBC: Recent Labs  Lab 02/27/22 1028 03/03/22 0509 03/04/22 0705  WBC  3.6* 4.4 3.1*  NEUTROABS 1.6* 2.6 1.9  HGB 13.4 11.7* 13.0  HCT 42.6 35.5* 39.7  MCV 98.2 93.7 95.7  PLT 282 258 257   Blood Culture    Component Value Date/Time   SDES URINE, CLEAN CATCH 08/12/2021 1250   SPECREQUEST  08/12/2021 1250    NONE Performed at [facility] [address] [place], [place] [address]    CULT MULTIPLE SPECIES PRESENT, SUGGEST RECOLLECTION (A) 08/12/2021 1250   REPTSTATUS 08/15/2021 FINAL 08/12/2021 1250    Cardiac Enzymes: No results for input(s): "CKTOTAL", "CKMB", "CKMBINDEX", "TROPONINI" in the last 168 hours. CBG: No results for input(s): "GLUCAP" in the last 168 hours. Iron Studies: No results for input(s): "IRON", "TIBC", "TRANSFERRIN", "FERRITIN" in the last 72 hours. Lab Results  Component Value Date   INR 1.0 02/23/2022   INR 1.1 01/29/2022   INR 1.5 (H) 12/24/2021   Studies/Results: No results found.  Medications:   amLODipine  10 mg Oral Daily   apixaban  2.5 mg Oral BID  carvedilol  6.25 mg Oral BID WC   Chlorhexidine Gluconate Cloth  6 each Topical Q0600   cloNIDine  0.1 mg Transdermal Q Sat   isosorbide mononitrate  30 mg Oral Daily   lidocaine  1 patch Transdermal Q24H   rosuvastatin  10 mg Oral Daily   sevelamer carbonate  2.4 g Oral TID WC    Dialysis Orders: TTS at [facility] 3:75 hrs [id number]/Autoflow 1.5 45 kg 2.0 K/ 2.0 Ca UFP 2 TDC no heparin - No ESA/Venofer - Hectorol 4 mcg IV TIW  Assessment/Plan: Acute cortical CVA: Dysarthric, R arm weakness.  Now in CIR. Hypertensive Emergency:  BP variable, no edema on exam or imaging. On RA with stable O2 saturations. PAF: On Eliquis (restarted 9/28) ESRD: Continue HD on TTS; Next HD today. Low-dose Lokelma given yesterday for K+ 5.2. K+ today 5.1. She's been under her EDW. Will lower at dc. Anemia: Hgb 13, no ESA needed. Metabolic bone disease: Ca super high on 9/29 and holding VDRA. Itchiness can also be from hyperphosphatemia. PO4 now ranging in 7s. Continue  Renvela powder 1 packet TID with meals per her outpatient regimen. May need to titrate up binders or even add Sensipar if levels do not improve. Nutrition - Albumin at goal. Renal diet.  Tobie Poet, NP [facility] 03/05/2022,1:48 PM  LOS: 7 days

## 2022-03-05 NOTE — Progress Notes (Signed)
Occupational Therapy Session Note  Patient Details  Name: Monique Henderson MRN: [id number] Date of Birth: [birth year]  Today's Date: 03/05/2022 OT Individual Time: 7185-5015 OT Individual Time Calculation (min): 70 min    Short Term Goals: Week 1:  OT Short Term Goal 1 (Week 1): STG = LTG 2/2 ELOS  Skilled Therapeutic Interventions/Progress Updates:    Pt greeted seated EOB brushing hair with set-up A from her sister. Pt reported she already showered with tech. OT eductaed on hemi dressing techniques and things to try in shower if she id not doing it in therapy. Pt did don pants at EOB with supervision. Ambulated to therapy gym without AD and close supervision. Worked on functional use of R UE with cup stacking activity. OT placed co-band around cup to assist with grasp, then  guided A to maintain grasp. Problem solving, visual scanning, and L FMC with slide board puzzle-min A. Pt ambulated to therapy apartment with close supervision. Practiced tub shower transfer with and without tub bench with close supervision and grab bars. Pt will be limited to sponge baths at home due to dialysis catheter on chest, but still discussed safe transfer in reference to functional deficits. Pt brought into quadruped on therapy mat for core strengthening and weight bearing for neuro re-ed. Sit<>stands while holding on medium sized bosu ball, incorporated chest press with ball at each stand. Pt then able to carry ball with BUEs back to room while ambulating with CGA. Pt left seated EOB with sister present, bed alarm on, and needs met.   Therapy Documentation Precautions:  Precautions Precautions: Fall Precaution Comments: Min R LE hemiparesis Restrictions Weight Bearing Restrictions: No Pain: Pain Assessment Pain Scale: 0-10 Pain Score: 0-No pain   Therapy/Group: Individual Therapy  Valma Cava 03/05/2022, 11:39 AM

## 2022-03-05 NOTE — Progress Notes (Signed)
   03/05/22 2000  Vitals  Temp 98.2 F (36.8 C)  BP (!) 158/84  MAP (mmHg) 109  Pulse Rate 82  Resp 18  Post Treatment  Dialyzer Clearance Heavily streaked  Duration of HD Treatment -hour(s) 3.5 hour(s)  Liters Processed 84  Fluid Removed 1000 mL  Tolerated HD Treatment Yes   TX fin. W/o difficulty.

## 2022-03-05 NOTE — Progress Notes (Signed)
Physical Therapy Session Note  Patient Details  Name: Monique Henderson MRN: [id number] Date of Birth: 01-12-1951  Today's Date: 03/05/2022 PT Individual Time: 0903-0959 PT Individual Time Calculation (min): 56 min   Short Term Goals: Week 1:  PT Short Term Goal 1 (Week 1): STG=LTG 2/2 ELOS  Skilled Therapeutic Interventions/Progress Updates:     Pt received seated at Eob and agrees to therapy. Reports 10/10 pain in L ribs area underneath L breast. RN alerted and vitals assessed. O2 100% on room air and HR 75 BPM. BP 158/90. MD informed of pain as well. PT provides rest breaks as needed to manage pain. Pt performs sit to stand with cues for initiation. Pt ambulates to dayroom with cues for navigation. Seated rest break. Pt then ambulates to elevator, manages elevator with cues for navigation, then ambulates outside for gait training over unlevel and varying surfaces. Pt ambulates up and down graded inclines with cues to increase R step height for safety, up/down 8 steps with CGA and no hand rail, with cues for safety. Pt manages curbs and crossing street with cues to look both ways for safety. Total ambulation >2000'. Pt takes seated rest break inside. Pt then performs NMR for standing balance and cognition with task of tapping toes on colored and numbered latex circles. Pt performs single step commands with toe taps, progressing to 2 and 3 steps commands to challenge working memory as well as dynamic standing balance and coordination. Pt does not have any LOBs but has difficulty correctly sequencing 3 step commands, but routinely completes 2 step commands without error. WC transport back to room. Pt left seated on edge of bed with all needs within reach.  Therapy Documentation Precautions:  Precautions Precautions: Fall Precaution Comments: Min R LE hemiparesis Restrictions Weight Bearing Restrictions: No    Therapy/Group: Individual Therapy  Breck Coons, PT, DPT 03/05/2022, 4:31 PM

## 2022-03-05 NOTE — Progress Notes (Signed)
Occupational Therapy Discharge Summary  Patient Details  Name: Monique Henderson MRN: [id number] Date of Birth: 1951-01-08  Date of Discharge from [place] 8, 2023  Patient has met 8 of 8 long term goals due to improved activity tolerance, improved balance, postural control, ability to compensate for deficits, functional use of  RIGHT upper and RIGHT lower extremity, improved attention, improved awareness, and improved coordination.  Patient to discharge at overall Supervision level.  Patient's care partner is independent to provide the necessary physical assistance at discharge.    Reasons goals not met: n/a  Recommendation:  Patient will benefit from ongoing skilled OT services in outpatient setting to continue to advance functional skills in the area of BADL.  Equipment: No equipment provided  Reasons for discharge: treatment goals met and discharge from hospital  Patient/family agrees with progress made and goals achieved: Yes  OT Discharge Precautions/Restrictions  Precautions Precautions: Fall Precaution Comments: R hemiplegia Restrictions Weight Bearing Restrictions: No ADL ADL Eating: Set up Where Assessed-Eating: Bed level Grooming: Supervision/safety Where Assessed-Grooming: Standing at sink Upper Body Bathing: Supervision/safety Where Assessed-Upper Body Bathing: Chair (simulated) Lower Body Bathing: Supervision/safety Where Assessed-Lower Body Bathing: Chair Upper Body Dressing: Supervision/safety Where Assessed-Upper Body Dressing: Chair Lower Body Dressing: Supervision/safety Where Assessed-Lower Body Dressing: Sitting at sink, Standing at sink, Chair Toileting: Supervision/safety Where Assessed-Toileting: Bedside Commode, Toilet Toilet Transfer: Distant supervision Toilet Transfer Method: Counselling psychologist: Bedside commode Tub/Shower Transfer: Close supervison Clinical cytogeneticist Method: Unable to assess Financial planner: Unable to assess Social research officer, government Method: Unable to assess Vision Eye Alignment: Within Functional Limits Ocular Range of Motion: Within Functional Limits Alignment/Gaze Preference: Within Defined Limits Perception  Perception: Within Functional Limits Praxis Praxis: Intact Cognition Cognition Overall Cognitive Status: Impaired/Different from baseline Arousal/Alertness: Awake/alert Orientation Level: Person;Place;Situation Person: Oriented Place: Oriented Situation: Oriented Focused Attention: Appears intact Sustained Attention: Appears intact Selective Attention: Appears intact Awareness: Appears intact Problem Solving: Impaired Problem Solving Impairment: Verbal complex Behaviors: Impulsive Safety/Judgment: Impaired Brief Interview for Mental Status (BIMS) Repetition of Three Words (First Attempt): 3 Temporal Orientation: Year: Correct Temporal Orientation: Month: Accurate within 5 days Temporal Orientation: Day: Correct Recall: "Sock": Yes, no cue required Recall: "Blue": Yes, no cue required Recall: "Bed": Yes, no cue required BIMS Summary Score: 15 Sensation Sensation Light Touch: Appears Intact Proprioception: Impaired by gross assessment (RUE) Additional Comments: Pt has some sensation in R UE altough diminished, improved since eval Coordination Gross Motor Movements are Fluid and Coordinated: No Fine Motor Movements are Fluid and Coordinated: No Coordination and Movement Description: smoothness and accuracy impoved, R hand still without functoinal grasp Finger Nose Finger Test: unable to complete on RUE due to hemi-plegia, however dysmetria on LUE Motor  Motor Motor: Hemiplegia Motor - Discharge Observations: COntinues to have R hemiplegia UE affected more than LE Mobility  Bed Mobility Bed Mobility: Rolling Right;Rolling Left;Supine to Sit Rolling Right: Supervision/verbal cueing Rolling Left: Supervision/Verbal cueing Supine to Sit:  Supervision/Verbal cueing Transfers Sit to Stand: Independent with assistive device Stand to Sit: Independent with assistive device  Trunk/Postural Assessment  Cervical Assessment Cervical Assessment: Within Functional Limits Thoracic Assessment Thoracic Assessment: Within Functional Limits Lumbar Assessment Lumbar Assessment: Within Functional Limits  Balance Balance Balance Assessed: Yes Dynamic Sitting Balance Dynamic Sitting - Balance Support: Feet supported Dynamic Sitting - Level of Assistance: 6: Modified independent (Device/Increase time) Static Standing Balance Static Standing - Balance Support: No upper extremity supported Static Standing - Level of Assistance: 6: Modified independent (Device/Increase time) Dynamic  Standing Balance Dynamic Standing - Level of Assistance: 5: Stand by assistance Extremity/Trunk Assessment RUE Assessment RUE Assessment: Exceptions to [facility] Active Range of Motion (AROM) Comments: 110 degrees shoulder flexion, WFL elbow extension, wrist and hand with some synergy starting at [facility] I-II General Strength Comments: 3-/5 shoulder flexion, 4-/5 triceps, flaccid distally RUE Body System: Neuro Brunstrum levels for arm and hand: Hand Brunstrum level for hand: Stage II Synergy is developing LUE Assessment LUE Assessment: Within Functional Limits Active Range of Motion (AROM) Comments: Limted shoulder flexion at 120 degrees, otherwise Monique Henderson   Monique Henderson 03/06/2022, 4:00 PM

## 2022-03-05 NOTE — Progress Notes (Signed)
Speech Language Pathology Daily Session Note  Patient Details  Name: Monique Henderson MRN: [id number] Date of Birth: Sep 06, 1950  Today's Date: 03/05/2022 SLP Individual Time: 0725-0825 SLP Individual Time Calculation (min): 60 min  Short Term Goals: Week 1: SLP Short Term Goal 1 (Week 1): STG=LTG due to ELOS  Skilled Therapeutic Interventions: Skilled treatment session focused on dysphagia and speech intelligibility goals. Patient's diet had been modified by the physician to a renal diet due to her ESRD. Patient was previously on a Dys. 2 diet due to her moderate oral dysphagia. SLP donned patient's top and bottom dentures with adhesive for proper fit. Patient demonstrated prolonged mastication with mild right buccal pocketing/lingual residue that patient cleared with liquid washes and a lingual sweep. Intermittent verbal cues were needed to maximize awareness. Patient also attempting to talk with a full oral cavity with SLP educating on the importance of attention to the bolus due to decreased oral control. No overt s/s of aspiration observed, however, it took more than a reasonable amount of time for patient to consume her meal (~50 minutes).  Patient reported that she did not mind the time nor the effort it currently takes to consume a regular textured diet, therefore, recommend patient remain on this diet at this time. Recommend set-up assist (dentures, patient sitting EOB, oral care after meals) with intermittent supervision to maximize use of swallowing compensatory strategies. Patient verbalized understanding and agreement. SLP also facilitated session by providing Mod verbal cues for patient to utilize her speech intelligibility strategies at the phrase level to achieve ~70% intelligibility. Patient left sitting EOB with NT present. Continue with current plan of care.   Pain No/Denies Pain   Therapy/Group: Individual Therapy  Becka Lagasse 03/05/2022, 3:05 PM

## 2022-03-05 NOTE — Progress Notes (Signed)
Patient ID: ANWEN CANNEDY, female   DOB: 1950-06-04, 71 y.o.   MRN: [id number]  SW spoke with pt son Laurelyn Sickle to discuss what the family decided with regard to transportation. He reported he would like for [place] to discuss with her in more detail and see what she decides.   *SW spoke with pt during PT session to discuss above. She reports her boyfriend Jeneen Rinks can take her and she does not want SW to set up transportation for dialysis or outpatient therapy appointments. SW called pt son Laurelyn Sickle to inform on above. He will follow-up with SW to confirm family edu.   SW made efforts to schedule PCP with [facility] location as was informed pt has been dismissed from [facility] because of five no shows at [facility] location with Dr. Orland Mustard.   SW scheduled Hospital follow-up appt with [facility] 11/10 at 9:45am with Dr. Virl Cagey.  Loralee Pacas, MSW, [place] Office: [phone] Cell: [phone] Fax: [phone]

## 2022-03-05 NOTE — Progress Notes (Signed)
PROGRESS NOTE   Subjective/Complaints:  Pt with PT this AM, complaining of left lower chest pain, 10/10. Vitals stable. Pain worse with deep inhalation, similar to prior episodes. Reproducible on palpation.  Pt also complains of diffcuse itching on her back, shoulders since dialysis yesterday. States she got PO benadryl with no effect, would like something topical.    ROS: Denies fevers, chills, N/V, abdominal pain, constipation, diarrhea, SOB, cough, chest pain, new weakness or paraesthesias.    Objective:   No results found. Recent Labs    03/03/22 0509 03/04/22 0705  WBC 4.4 3.1*  HGB 11.7* 13.0  HCT 35.5* 39.7  PLT 258 257    Recent Labs    03/04/22 0705 03/05/22 0523  NA 134* 135  K 5.2* 5.1  CL 92* 92*  CO2 27 26  GLUCOSE 91 86  BUN 51* 92*  CREATININE 7.38* 10.18*  CALCIUM 10.1 9.8     Intake/Output Summary (Last 24 hours) at 03/05/2022 1205 Last data filed at 03/05/2022 0828 Gross per 24 hour  Intake 118 ml  Output --  Net 118 ml         Physical Exam: Vital Signs Blood pressure (!) 178/104, pulse 73, temperature 97.8 F (36.6 C), temperature source Oral, resp. rate 20, height '5\' 3"'$  (1.6 m), weight 42.3 kg, SpO2 100 %. Physical Exam Constitutional: Appropriate appearance for age.  Sitting in bed.  HENT: No JVD. Neck Supple. Trachea midline. Atraumatic, normocephalic. Eyes: PERRLA. Conjugate gaze Cardiovascular: RRR, no murmurs/rub/gallops. No Edema. Peripheral pulses 2+  Respiratory: CTAB. No rales, rhonchi, or wheezing. On RA.  Abdomen: + bowel sounds, normoactive. No distention or tenderness.  GU: Not examined. Skin: C/D/I. No apparent lesions. No apparent rash or erythema.   Neurologic exam:  Cognition: AAO to person, place, time and event.  Language: Severe dysarthria much improved with top and bottom dentures, fluent. follows commands Insight: Good insight into current condition.   CN: + R facial droop, + R tongue deviation, + R shoulder weakness; otherwise intact MSK:      No apparent deformity. Moving all 4 limbs antigravity. Left lower anterior ribcage TTP.       Strength:                RUE: 4-/5 SA, 3+/5 EF, 3+/5 EE, 4-/5 WE, 3/5 FF, 3/5 FA                 LUE: 5/5                 RLE: 5/5                 LLE:  5/5         Assessment/Plan: 1. Functional deficits which require 3+ hours per day of interdisciplinary therapy in a comprehensive inpatient rehab setting. Physiatrist is providing close team supervision and 24 hour management of active medical problems listed below. Physiatrist and rehab team continue to assess barriers to discharge/monitor patient progress toward functional and medical goals  Care Tool:  Bathing    Body parts bathed by patient: Face   Body parts bathed by helper: Left arm, Right lower leg, Buttocks  Bathing assist Assist Level: Contact Guard/Touching assist     Upper Body Dressing/Undressing Upper body dressing   What is the patient wearing?: Pull over shirt    Upper body assist Assist Level: Maximal Assistance - Patient 25 - 49%    Lower Body Dressing/Undressing Lower body dressing      What is the patient wearing?: Pants     Lower body assist Assist for lower body dressing: Contact Guard/Touching assist     Toileting Toileting    Toileting assist Assist for toileting: Moderate Assistance - Patient 50 - 74% (simulated)     Transfers Chair/bed transfer  Transfers assist     Chair/bed transfer assist level: Supervision/Verbal cueing     Locomotion Ambulation   Ambulation assist      Assist level: Supervision/Verbal cueing Assistive device: No Device Max distance: 200+   Walk 10 feet activity   Assist     Assist level: Supervision/Verbal cueing Assistive device: No Device   Walk 50 feet activity   Assist    Assist level: Supervision/Verbal cueing Assistive device: No Device     Walk 150 feet activity   Assist    Assist level: Supervision/Verbal cueing Assistive device: No Device    Walk 10 feet on uneven surface  activity   Assist     Assist level: Minimal Assistance - Patient > 75% Assistive device: Other (comment) (no device)   Wheelchair     Assist Is the patient using a wheelchair?: Yes Type of Wheelchair: Manual    Wheelchair assist level: Contact Guard/Touching assist Max wheelchair distance: 30    Wheelchair 50 feet with 2 turns activity    Assist        Assist Level: Total Assistance - Patient < 25%   Wheelchair 150 feet activity     Assist      Assist Level: Total Assistance - Patient < 25%   Blood pressure (!) 178/104, pulse 73, temperature 97.8 F (36.6 C), temperature source Oral, resp. rate 20, height '5\' 3"'$  (1.6 m), weight 42.3 kg, SpO2 100 %.  Medical Problem List and Plan: 1. 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             -patient may shower             -ELOS/Goals: 10-14 days  -Continue CIR 2.  Antithrombotics: -DVT/anticoagulation:  Pharmaceutical: Eliquis             -antiplatelet therapy: none 3. Pain Management: Tylenol as needed            - Note: Was discharged from OP PM&R clinic 11/17/2010 by Dr. Lucia Estelle for medication noncompliance           - PRN lidocaine patch to left lower ribs for MSK pain  4. Mood/Behavior/Sleep: LCSW to evaluate and provide emotional support             -antipsychotic agents: n/a             - Neuropsych eval completed, no new recs  5. Neuropsych/cognition: This patient is capable of making decisions on her own behalf. 6. Skin/Wound Care: routine skin care checks.             - Added PRN topical benadryl for shoulder, back itching. 7. Fluids/Electrolytes/Nutrition:  strict Is and Os. Labs per nephrology             -  dysphagia 2/thin liquids; SLP eval             - Nephrology following for dialysis; defer electrolyte management to them            - 10/3 Hyperphosphatemia/Hypercalcemia: Nephrology has been holding VDRA and trending. Phos 10+ today, discussed with them initiating Renvela, they are looking into binders. Appreciate insight and recommendations. Started low phos diet.            - 10/4 Lokelma and Renvela per Nephrology. Appreciate management.   8: Hypertension: monitor             -continue amlodipine 10 mg daily             -continue carvedilol 6.25 mg daily             -continue clonidine 0.1 mg patch weekly (Sat)             -continue isosorbide mononitrate 30 mg daily  -10/2 BP intermittently elevated, no changes given variance with dialysis           - 10/5 - increased HTN today; no acute medication changes, add PRN hydralazine 10 mg for SBP >180, DBP >110    03/05/2022    4:50 AM 03/05/2022    3:26 AM 03/04/2022    7:51 PM  Vitals with BMI  Weight 93 lbs 4 oz    BMI 32.12    Systolic  248 250  Diastolic  037 88  Pulse  73 76     9: Hyperlipidemia: continue Crestor 10 mg daily 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 Chest pain - intermittent, L>R lower ribs, primarily on off-dialysis days       - 9/28 Tachycardia, irregular into 120s on initial exam; EKG unchanged. Troponin elevated, approx same as prior levels.  On chart review, several similar episodes while inpatient, consistently negative cardiac workup.        - 9/29 - 1x episode, vitals stable, self-resolved. Added lidocaine patch to lower chest.           - 10/1 - no episodes since dialysis yesterday; stable          - 10/5 - 1x as above; reproducible with  palpation; lido patch + tylenol LOS: 7 days A FACE TO FACE EVALUATION WAS PERFORMED  Gertie Gowda 03/05/2022, 12:05 PM

## 2022-03-06 MED ORDER — SEVELAMER CARBONATE 2.4 G PO PACK: 2.4000 g | PACK | Freq: Three times a day (TID) | ORAL | 0 refills | Status: DC

## 2022-03-06 MED ORDER — TRAZODONE HCL 50 MG PO TABS: 25.0000 mg | ORAL_TABLET | Freq: Every evening | ORAL | 0 refills | Status: DC | PRN

## 2022-03-06 MED ORDER — CLONIDINE 0.1 MG/24HR TD PTWK: 0.1000 mg | MEDICATED_PATCH | TRANSDERMAL | 0 refills | Status: DC

## 2022-03-06 MED ORDER — AMLODIPINE BESYLATE 10 MG PO TABS: 10.0000 mg | ORAL_TABLET | Freq: Every day | ORAL | 0 refills | Status: DC

## 2022-03-06 MED ORDER — LIDOCAINE 5 % EX PTCH: 1.0000 | MEDICATED_PATCH | CUTANEOUS | 0 refills | Status: DC

## 2022-03-06 MED ORDER — ISOSORBIDE MONONITRATE ER 30 MG PO TB24: 30.0000 mg | ORAL_TABLET | Freq: Every day | ORAL | 0 refills | Status: DC

## 2022-03-06 MED ORDER — DIPHENHYDRAMINE-ZINC ACETATE 2-0.1 % EX CREA: TOPICAL_CREAM | Freq: Three times a day (TID) | CUTANEOUS | 0 refills | Status: DC | PRN

## 2022-03-06 MED ORDER — ROSUVASTATIN CALCIUM 10 MG PO TABS: 10.0000 mg | ORAL_TABLET | Freq: Every day | ORAL | 0 refills | Status: DC

## 2022-03-06 MED ORDER — APIXABAN 2.5 MG PO TABS: 2.5000 mg | ORAL_TABLET | Freq: Two times a day (BID) | ORAL | 0 refills | Status: AC

## 2022-03-06 MED ORDER — CARVEDILOL 6.25 MG PO TABS: 6.2500 mg | ORAL_TABLET | Freq: Two times a day (BID) | ORAL | 0 refills | Status: DC

## 2022-03-06 NOTE — Progress Notes (Signed)
Speech Language Pathology Daily Session Note  Patient Details  Name: [name] MRN: [id number] Date of Birth: 09-Jan-1951  Today's Date: 03/06/2022 SLP Individual Time: 5852-7782 SLP Individual Time Calculation (min): 40 min  Short Term Goals: Week 1: SLP Short Term Goal 1 (Week 1): STG=LTG due to ELOS  Skilled Therapeutic Interventions: Skilled treatment session focused on communication goals. Patient independently recalled her speech intelligibility strategies and utilized them at the phrase and sentence level with overall Mod A verbal cues during a verbal description task. SLP also provided Min verbal cues for patient to self-monitor and correct errors. Patient left upright in bed with alarm on and all needs within reach. Continue with current plan of care.      Pain No/Denies Pain   Therapy/Group: Individual Therapy  [name] 03/06/2022, 3:07 PM

## 2022-03-06 NOTE — Progress Notes (Deleted)
Inpatient Rehabilitation Discharge Medication Review by a Pharmacist  A complete drug regimen review was completed for this patient to identify any potential clinically significant medication issues.  High Risk Drug Classes Is patient taking? Indication by Medication  Antipsychotic No   Anticoagulant Yes Apixaban- PAF  Antibiotic No   Opioid No   Antiplatelet No   Hypoglycemics/insulin No   Vasoactive Medication Yes Norvasc, Coreg, Catapres, Imdur- HTN  Chemotherapy No   Other Yes Crestor- HLD Sevelamer- hyper phosphotemia Ergocalciferol- Vitamin D supplement Lidoderm topical patch-Left lower rib pain Albuterol HFA- wheezing or shortness of breath Trazodone -sleep Benadryl topical  for shoulder, back itching      Type of Medication Issue Identified Description of Issue Recommendation(s)  Drug Interaction(s) (clinically significant)     Duplicate Therapy     Allergy     No Medication Administration End Date     Incorrect Dose     Additional Drug Therapy Needed     Significant med changes from prior encounter (inform family/care partners about these prior to discharge).  PTA Hydralazine, Losartan,   Mirtazapine, torsemide discontinued.   Other       Clinically significant medication issues were identified that warrant physician communication and completion of prescribed/recommended actions by midnight of the next day:  No    Time spent performing this drug regimen review (minutes):  [place],  Clinical Pharmacist 03/06/2022 3:00 PM

## 2022-03-06 NOTE — Plan of Care (Signed)
  Problem: RH Simple Meal Prep Goal: LTG Patient will perform simple meal prep w/assist (OT) Description: LTG: Patient will perform simple meal prep with assistance, with/without cues (OT). Outcome: Completed/Met

## 2022-03-06 NOTE — Progress Notes (Addendum)
[facility] Progress Note   Subjective:   Patient seen and examined in room. Reports she is feels that she is improving.  States she was walking around the hospital today with PT.  Denies CP, SOB, abdominal pain and n/v/d.   Objective Vitals:   03/05/22 2000 03/06/22 0347 03/06/22 0348 03/06/22 1416  BP: (!) 158/84  (!) 179/105 131/82  Pulse: 82  73 78  Resp: '18  16 19  '$ Temp: 98.2 F (36.8 C)  98.2 F (36.8 C) 97.8 F (36.6 C)  TempSrc:      SpO2: 100%  97% 100%  Weight: 45.8 kg 46.4 kg    Height:       Physical Exam General:chronically ill appearing, thin female in NAD Heart:RRR Lungs:nml WOB on RA Abdomen:non distended Extremities:no LE edema Dialysis Access: R [facility]   Filed Weights   03/05/22 1949 03/05/22 2000 03/06/22 0347  Weight: 45.8 kg 45.8 kg 46.4 kg    Intake/Output Summary (Last 24 hours) at 03/06/2022 1520 Last data filed at 03/06/2022 1300 Gross per 24 hour  Intake 680 ml  Output 2001 ml  Net -1321 ml    Additional Objective Labs: Basic Metabolic Panel: Recent Labs  Lab 03/03/22 0509 03/04/22 0705 03/05/22 0523  NA 133* 134* 135  K 6.1* 5.2* 5.1  CL 91* 92* 92*  CO2 '22 27 26  '$ GLUCOSE 84 91 86  BUN 87* 51* 92*  CREATININE 11.35* 7.38* 10.18*  CALCIUM 9.4 10.1 9.8  PHOS 10.6* 7.2* 7.6*   Liver Function Tests: Recent Labs  Lab 03/03/22 0509 03/04/22 0705 03/05/22 0523  ALBUMIN 3.5 3.7 3.4*    CBC: Recent Labs  Lab 03/03/22 0509 03/04/22 0705 03/05/22 1619  WBC 4.4 3.1* 4.0  NEUTROABS 2.6 1.9  --   HGB 11.7* 13.0 10.5*  HCT 35.5* 39.7 32.6*  MCV 93.7 95.7 95.9  PLT 258 257 234   Lab Results  Component Value Date   INR 1.0 02/23/2022   INR 1.1 01/29/2022   INR 1.5 (H) 12/24/2021   Studies/Results: No results found.  Medications:   amLODipine  10 mg Oral Daily   apixaban  2.5 mg Oral BID   carvedilol  6.25 mg Oral BID WC   cloNIDine  0.1 mg Transdermal Q Sat   isosorbide mononitrate  30 mg Oral Daily    lidocaine  1 patch Transdermal Q24H   rosuvastatin  10 mg Oral Daily   sevelamer carbonate  2.4 g Oral TID WC    Dialysis Orders: TTS at [facility] 3:75 hrs [id number]/Autoflow 1.5 45 kg 2.0 K/ 2.0 Ca UFP 2 TDC no heparin - No ESA/Venofer - Hectorol 4 mcg IV TIW   Assessment/Plan: Acute cortical CVA: Dysarthric, R arm weakness.  Now in CIR. 2.  Hypertensive Emergency:  BP variable.  In goal today.  On amlodipine '10mg'$  qd, coreg 6.58mBID, clonidine 0.'1mg'$  patch. 3.  Volume - does not appear volume overloaded.  +/- EDW, may need to be lowered on d/c.  Get standing weight to better determine, UF as tolerated. 4.  PAF: On Eliquis (restarted 9/28) 5.  ESRD: Continue HD on TTS; Next HD tomorrow. K 5.1 today.   6.  Anemia: Hgb 10.5, no ESA needed. 7.  Metabolic bone disease: Ca super high on 9/29 and holding VDRA. Itchiness can also be from hyperphosphatemia. PO4 now ranging in 7s. Increase Renvela to 2.4g AC TID.  Check pth. May need to start sensipar.  8.  Nutrition - Albumin  at goal. Renal diet w/fluid restrictions.   Jen Mow, PA-C [facility] 03/06/2022,3:20 PM  LOS: 8 days

## 2022-03-06 NOTE — Progress Notes (Signed)
Occupational Therapy Session Note  Patient Details  Name: Monique Henderson MRN: [id number] Date of Birth: 1950-11-16  Today's Date: 03/06/2022 OT Individual Time: 5859-2924 OT Individual Time Calculation (min): 57 min    Short Term Goals: Week 1:  OT Short Term Goal 1 (Week 1): STG = LTG 2/2 ELOS  Skilled Therapeutic Interventions/Progress Updates:    Pt greeted semi-reclined in bed asleep, easy to wake and agreeable to OT treatment session focused on self-care retraining, balance, and L UE NMR. Pt completed bed moblity and ambulated to the sink without AD mod I. OT issued wash mit to use L UE within bathing tasks as she does not have enough grasp to hold wash cloth. Once wash mit on, pt could bring R UE up to L shoulder to wash up and down her arm and under her armpit! Educated on use of L hand as a stabilizer within other functional tasks such as brushing teeth and opening containers. OT educated on hemi dressing techniques in which she was able to demonstrate understanding and completed with supervision. Pt ambulated to therapy gym mod I. Worked on bimanual activity holding medium sized beach ball, then standing with oblique twist and chest press. Pt then ambulated in hallway while maintaining position of ball out in front. Pt needed intermittent min A to keep R hand on ball. Pt returned to room and back to bed in sidelying. OT placed SAEBO e-stim on wrist extensors.Marland Kitchen SAEBO left on for 60 minutes. OT returned to remove SAEBO with skin intact and no adverse reactions.  [place] Stim One 330 pulse width 35 Hz pulse rate On 8 sec/ off 8 sec Ramp up/ down 2 sec Symmetrical Biphasic wave form  Max intensity 127m at 500 Ohm load  When SAEBO cycling off, OT had pt try to grasp sponge. Pt left in bed with alarm on, call bell in reach, and needs met.   Therapy Documentation Precautions:  Precautions Precautions: Fall Precaution Comments: Min R LE hemiparesis Restrictions Weight Bearing  Restrictions: No Pain:  Denies pain   Therapy/Group: Individual Therapy  [name]10/10/2021, 8:37 AM

## 2022-03-06 NOTE — Progress Notes (Signed)
Pt's clinic is aware of pt's d/c on Sunday and that pt will resume care on Tuesday.   Melven Sartorius Renal Navigator [phone]

## 2022-03-06 NOTE — Progress Notes (Signed)
PROGRESS NOTE   Subjective/Complaints:  Pt seen eating lunch this afternoon. No acute complaints. No events overnight. Endorses no issues with regular consistency diet since getting her dentures.   No episodes of chest pain since dialysis yesterday.   ROS: Denies fevers, chills, N/V, abdominal pain, constipation, diarrhea, SOB, cough, chest pain, new weakness or paraesthesias.    Objective:   No results found. Recent Labs    03/04/22 0705 03/05/22 1619  WBC 3.1* 4.0  HGB 13.0 10.5*  HCT 39.7 32.6*  PLT 257 234    Recent Labs    03/04/22 0705 03/05/22 0523  NA 134* 135  K 5.2* 5.1  CL 92* 92*  CO2 27 26  GLUCOSE 91 86  BUN 51* 92*  CREATININE 7.38* 10.18*  CALCIUM 10.1 9.8     Intake/Output Summary (Last 24 hours) at 03/06/2022 1420 Last data filed at 03/06/2022 0802 Gross per 24 hour  Intake 560 ml  Output 2001 ml  Net -1441 ml         Physical Exam: Vital Signs Blood pressure 131/82, pulse 78, temperature 97.8 F (36.6 C), resp. rate 19, height '5\' 3"'$  (1.6 m), weight 46.4 kg, SpO2 100 %. Physical Exam Constitutional: Appropriate appearance for age.  HENT: No JVD. Neck Supple. Trachea midline. Atraumatic, normocephalic. Eyes: PERRLA. Conjugate gaze Cardiovascular: RRR, no murmurs/rub/gallops. No Edema. Peripheral pulses 2+  Respiratory: CTAB. No rales, rhonchi, or wheezing. On RA.  Abdomen: + bowel sounds, normoactive. No distention or tenderness.  GU: Not examined. Skin: C/D/I. No apparent lesions. No apparent rash or erythema.   Neurologic exam:  Cognition: AAO to person, place, time and event.  Language: Severe dysarthria much improved with top and bottom dentures, fluent. follows commands Insight: Good insight into current condition.  CN: + R facial droop, + R tongue deviation, + R shoulder weakness; otherwise intact MSK:      No apparent deformity. Moving all 4 limbs antigravity.        PE from prior exams: Strength:                RUE: 4-/5 SA, 3+/5 EF, 3+/5 EE, 4-/5 WE, 3/5 FF, 3/5 FA                 LUE: 5/5                 RLE: 5/5                 LLE:  5/5         Assessment/Plan: 1. Functional deficits which require 3+ hours per day of interdisciplinary therapy in a comprehensive inpatient rehab setting. Physiatrist is providing close team supervision and 24 hour management of active medical problems listed below. Physiatrist and rehab team continue to assess barriers to discharge/monitor patient progress toward functional and medical goals  Care Tool:  Bathing    Body parts bathed by patient: Right arm, Left arm, Chest, Abdomen, Front perineal area, Buttocks, Right upper leg, Left upper leg, Right lower leg, Left lower leg, Face   Body parts bathed by helper: Left arm, Right lower leg, Buttocks     Bathing assist Assist Level:  Supervision/Verbal cueing     Upper Body Dressing/Undressing Upper body dressing   What is the patient wearing?: Pull over shirt    Upper body assist Assist Level: Supervision/Verbal cueing    Lower Body Dressing/Undressing Lower body dressing      What is the patient wearing?: Pants     Lower body assist Assist for lower body dressing: Supervision/Verbal cueing     Toileting Toileting    Toileting assist Assist for toileting: Supervision/Verbal cueing     Transfers Chair/bed transfer  Transfers assist     Chair/bed transfer assist level: Independent with assistive device     Locomotion Ambulation   Ambulation assist      Assist level: Supervision/Verbal cueing Assistive device: No Device Max distance: 200+   Walk 10 feet activity   Assist     Assist level: Supervision/Verbal cueing Assistive device: No Device   Walk 50 feet activity   Assist    Assist level: Supervision/Verbal cueing Assistive device: No Device    Walk 150 feet activity   Assist    Assist level:  Supervision/Verbal cueing Assistive device: No Device    Walk 10 feet on uneven surface  activity   Assist     Assist level: Minimal Assistance - Patient > 75% Assistive device: Other (comment) (no device)   Wheelchair     Assist Is the patient using a wheelchair?: Yes Type of Wheelchair: Manual    Wheelchair assist level: Contact Guard/Touching assist Max wheelchair distance: 30    Wheelchair 50 feet with 2 turns activity    Assist        Assist Level: Total Assistance - Patient < 25%   Wheelchair 150 feet activity     Assist      Assist Level: Total Assistance - Patient < 25%   Blood pressure 131/82, pulse 78, temperature 97.8 F (36.6 C), resp. rate 19, height '5\' 3"'$  (1.6 m), weight 46.4 kg, SpO2 100 %.  Medical Problem List and Plan: 1. 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             -patient may shower             -ELOS/Goals: 10-14 days  -Continue CIR 2.  Antithrombotics: -DVT/anticoagulation:  Pharmaceutical: Eliquis             -antiplatelet therapy: none 3. Pain Management: Tylenol as needed            - Note: Was discharged from OP PM&R clinic 11/17/2010 by Dr. Lucia Estelle for medication noncompliance           - PRN lidocaine patch to left lower ribs for MSK pain  4. Mood/Behavior/Sleep: LCSW to evaluate and provide emotional support             -antipsychotic agents: n/a             - Neuropsych eval completed, no new recs  5. Neuropsych/cognition: This patient is capable of making decisions on her own behalf. 6. Skin/Wound Care: routine skin care checks.             - Added PRN topical benadryl for shoulder, back itching (per Nephro, likely d/t hyperphosphatemia).  7. Fluids/Electrolytes/Nutrition: strict Is and Os. Labs per nephrology              -dysphagia 2/thin  liquids; SLP eval             - Nephrology following for dialysis; defer electrolyte management to them            - 10/3 Hyperphosphatemia/Hypercalcemia: Nephrology has been holding VDRA and trending. Phos 10+ today, discussed with them initiating Renvela, they are looking into binders. Appreciate insight and recommendations. Started low phos diet.            - 10/4 Lokelma and Renvela per Nephrology.           - 10/5 K improved to 5.1; continue Renvela, may add Sensipar per Nephro.  Appreciate management.   8: Hypertension: monitor             -continue amlodipine 10 mg daily             -continue carvedilol 6.25 mg daily             -continue clonidine 0.1 mg patch weekly (Sat)             -continue isosorbide mononitrate 30 mg daily  -10/2 BP intermittently elevated, no changes given variance with dialysis           - 10/5 - increased HTN today; no acute medication changes, add PRN hydralazine 10 mg for SBP >180, DBP >110            -10/6 Consistent elevations 150-160s; 130s this AM; will need close OP monitorring with PCP at discharge     03/06/2022    2:16 PM 03/06/2022    3:48 AM 03/06/2022    3:47 AM  Vitals with BMI  Weight   102 lbs 5 oz  BMI   85.27  Systolic 782 423   Diastolic 82 536   Pulse 78 73      9: Hyperlipidemia: continue Crestor 10 mg daily 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 Chest pain - intermittent, L>R lower ribs, primarily on off-dialysis days       - 9/28 Tachycardia, irregular into 120s on initial exam; EKG unchanged. Troponin elevated, approx same as prior levels.  On chart review, several similar episodes while inpatient, consistently negative cardiac workup.        - 9/29 - 1x episode, vitals stable, self-resolved. Added  lidocaine patch to lower chest.           - 10/1 - no episodes since dialysis yesterday; stable          - 10/5 - 1x as above; reproducible with palpation; lido patch + tylenol LOS: 8 days A FACE TO FACE EVALUATION WAS PERFORMED  Monique Henderson 03/06/2022, 2:20 PM

## 2022-03-06 NOTE — Progress Notes (Addendum)
Physical Therapy Session Note  Patient Details  Name: Monique Henderson MRN: [id number] Date of Birth: Jun 24, 1950  Today's Date: 03/06/2022 PT Individual Time: 5400-8676 PT Individual Time Calculation (min): 40 min   Short Term Goals: Week 1:  PT Short Term Goal 1 (Week 1): STG=LTG 2/2 ELOS  Skilled Therapeutic Interventions/Progress Updates:    Pt received sitting upright in bed finishing eating lunch and agreeable to therapy session. Pt with food pocketed on R side of her mouth - attempted to cue pt to correct for this, but still some remained and gradually spilled out of pt's mouth during session but no instances of chocking or aspiration due to this.  Pt transitioned to R EOB with supervision. Pt ambulated over to mirror with therapist supporting her R UE to protect it and attempted to have pt clear the food out of her mouth.   Gait training ~175f to main therapy gym, no AD with therapist still supporting R UE to protect it, but otherwise providing close supervision - slight increased postural sway but no instances of true LOB.  Dynamic gait training with R UE NMR and dual-cognitive task of collecting 4 bean bags and 4 horseshoes from hallway - pt able to perform forearm supination and therapist has to facilitate/assist pt with opening her fingers (moving her thumb) to then place the object in the palm of her hand, she is able to use elbow flexion to hold up the object - pt does not yet have active finger flexion to grasp objects - close supervision for safety throughout.  Stair navigation training ascending/descending 11 steps (7" height) in stairwell using L UE support on each HR with CGA for safety - pt self-selected reciprocal stepping pattern with good control and safety.  Standing>quadruped on mat table with thearpist assisting/manually facilitating R hand placement safely on mat - therapist manually facilitating proper R UE alignment in WBing position while pt used L hand to  organize numbered disks per therapist's request (in ascending order, colored, and even vs odd).   Gait training back to room with assist as described above. Pt left supported upright in long sitting in the bed with needs in reach, bed alarm on, and nurse present.   Therapy Documentation Precautions:  Precautions Precautions: Fall Precaution Comments: Min R LE hemiparesis Restrictions Weight Bearing Restrictions: No   Pain:  Denies pain during session.   Therapy/Group: Individual Therapy  CTawana Scale, PT, DPT, NCS, CSRS 03/06/2022, 12:21 PM

## 2022-03-06 NOTE — Progress Notes (Signed)
Occupational Therapy Session Note  Patient Details  Name: Monique Henderson MRN: [id number] Date of Birth: [birth year]  Today's Date: 03/06/2022 OT Individual Time: 7915-0413 OT Individual Time Calculation (min): 60 min    Short Term Goals: Week 1:  OT Short Term Goal 1 (Week 1): STG = LTG 2/2 ELOS  Skilled Therapeutic Interventions/Progress Updates:    Upon OT arrival, pt semi recumbent in bed reporting no pain and is agreeable to OT treatment session. Treatment intervention with a focus on IADLs, functional mobility and problem solving. Pt completes supine to sit transfer independently and sit to stand transfer independently. Pt performs functional mobility to ADL apartment without device and retrieves dishes and silverware using B UE and transports items to and from the table. Pt completes with Supervision to locate items and requires increased time secondary to decreased distal movement of the R UE. Pt opens/closes refrigerator, freezer, and microwave independently and is able to retrieve/return items from each appliance. Pt requires seated rest break. Pt makes the bed using sheet, blanket, and pillow case using B UE. Pt completes independently without LOB and tolerates activity well. Pt removes all items from the bed and places them in the laundry bin with min difficulty. Pt was instructed to locate the cafeteria and main entrance then return back to her room. Pt with min difficulty to complete and requires min verbal cues to navigate. Pt performs functional mobility independently. Pt returns to EOB at end of session with all needs met and safety measures in place.    Therapy Documentation Precautions:  Precautions Precautions: Fall Precaution Comments: Min R LE hemiparesis Restrictions Weight Bearing Restrictions: No    Therapy/Group: Individual Therapy  Amerigo Mcglory 03/06/2022, 11:24 AM

## 2022-03-07 LAB — CBC
HCT: 32.4 % — ABNORMAL LOW (ref 36.0–46.0)
Hemoglobin: 10.6 g/dL — ABNORMAL LOW (ref 12.0–15.0)
MCH: 31.6 pg (ref 26.0–34.0)
MCHC: 32.7 g/dL (ref 30.0–36.0)
MCV: 96.7 fL (ref 80.0–100.0)
Platelets: 242 10*3/uL (ref 150–400)
RBC: 3.35 MIL/uL — ABNORMAL LOW (ref 3.87–5.11)
RDW: 14.9 % (ref 11.5–15.5)
WBC: 3.9 10*3/uL — ABNORMAL LOW (ref 4.0–10.5)
nRBC: 0 % (ref 0.0–0.2)

## 2022-03-07 LAB — RENAL FUNCTION PANEL
Albumin: 3.3 g/dL — ABNORMAL LOW (ref 3.5–5.0)
Anion gap: 12 (ref 5–15)
BUN: 62 mg/dL — ABNORMAL HIGH (ref 8–23)
CO2: 24 mmol/L (ref 22–32)
Calcium: 9.5 mg/dL (ref 8.9–10.3)
Chloride: 99 mmol/L (ref 98–111)
Creatinine, Ser: 9.82 mg/dL — ABNORMAL HIGH (ref 0.44–1.00)
GFR, Estimated: 4 mL/min — ABNORMAL LOW (ref >60)
Glucose, Bld: 131 mg/dL — ABNORMAL HIGH (ref 70–99)
Phosphorus: 6.1 mg/dL — ABNORMAL HIGH (ref 2.5–4.6)
Potassium: 5 mmol/L (ref 3.5–5.1)
Sodium: 135 mmol/L (ref 135–145)

## 2022-03-07 MED ORDER — PROSOURCE PLUS PO LIQD
30.0000 mL | Freq: Two times a day (BID) | ORAL | Status: DC
  Administered 2022-03-07: 30 mL via ORAL

## 2022-03-07 MED ORDER — HEPARIN SODIUM (PORCINE) 1000 UNIT/ML DIALYSIS
1000.0000 [IU] | INTRAMUSCULAR | Status: DC | PRN
  Administered 2022-03-07: 4000 [IU]
  Filled 2022-03-07 (×5): qty 1

## 2022-03-07 MED ORDER — ANTICOAGULANT SODIUM CITRATE 4% (200MG/5ML) IV SOLN: 5.0000 mL | Status: DC | PRN

## 2022-03-07 MED ORDER — DIPHENHYDRAMINE HCL 25 MG PO CAPS
25.0000 mg | ORAL_CAPSULE | Freq: Once | ORAL | Status: AC
  Administered 2022-03-07: 25 mg via ORAL
  Filled 2022-03-07: qty 1

## 2022-03-07 MED ORDER — ALTEPLASE 2 MG IJ SOLR: 2.0000 mg | Freq: Once | INTRAMUSCULAR | Status: DC | PRN

## 2022-03-07 NOTE — Progress Notes (Signed)
Physical Therapy Discharge Summary  Patient Details  Name: Monique Henderson MRN: [id number] Date of Birth: 07-Apr-1951  Date of Discharge from PT service:March 07, 2022  {CHL IP REHAB PT TIME CALCULATION:304800500}   Patient has met {NUMBERS 0-12:18577} of {NUMBERS 0-12:18577} long term goals due to {due EX:5170017}.  Patient to discharge at [facility] level {LOA:3049010}.   Patient's care partner {care partner:3041650} to provide the necessary {assistance:3041652} assistance at discharge.  Reasons goals not met: ***  Recommendation:  Patient will benefit from ongoing skilled PT services in {setting:3041680} to continue to advance safe functional mobility, address ongoing impairments in ***, and minimize fall risk.  Equipment: {equipment:3041657}  Reasons for discharge: {Reason for discharge:3049018}  Patient/family agrees with progress made and goals achieved: {Pt/Family agree with progress/goals:3049020}  PT Discharge Precautions/Restrictions   Vital Signs  Pain Pain Assessment Pain Scale: 0-10 Pain Score: 0-No pain Pain Interference Pain Interference Pain Effect on Sleep: 2. Occasionally Pain Interference with Therapy Activities: 1. Rarely or not at all Pain Interference with Day-to-Day Activities: 1. Rarely or not at all Vision/Perception  Vision - Assessment Eye Alignment: Within Functional Limits Ocular Range of Motion: Within Functional Limits Alignment/Gaze Preference: Within Defined Limits Saccades: Within functional limits Perception Perception: Within Functional Limits Praxis Praxis: Intact  Cognition   Sensation Sensation Light Touch: Impaired by gross assessment Proprioception: Impaired by gross assessment (RUE) Additional Comments: Pt has some sensation in R UE altough diminished, improved since eval Coordination Gross Motor Movements are Fluid and Coordinated: No Fine Motor Movements are Fluid and Coordinated: No Coordination and Movement  Description: smoothness and accuracy impoved, R hand still without functoinal grasp Finger Nose Finger Test: unable to complete on RUE due to hemi-plegia, however dysmetria on LUE Heel Shin Test: WFL R and L Motor  Motor Motor: Hemiplegia Motor - Discharge Observations: COntinues to have R hemiplegia UE affected more than LE  Mobility Bed Mobility Bed Mobility: Rolling Right;Rolling Left;Supine to Sit Rolling Right: Independent Rolling Left: Independent Supine to Sit: Independent Transfers Transfers: Sit to Stand;Stand to Sit;Stand Pivot Transfers Sit to Stand: Independent with assistive device Stand to Sit: Independent with assistive device Stand Pivot Transfers: Independent with assistive device Stand Pivot Transfer Details: Verbal cues for precautions/safety Locomotion  Gait Ambulation: Yes Gait Assistance: Independent with assistive device Assistive device: None Gait Assistance Details: 500 Gait Gait: Yes Gait Pattern: Within Functional Limits Stairs / Additional Locomotion Stairs: Yes Stairs Assistance: Supervision/Verbal cueing Stair Management Technique: No rails Number of Stairs: 12 Height of Stairs: 6 Wheelchair Mobility Wheelchair Mobility: No  Trunk/Postural Assessment  Cervical Assessment Cervical Assessment: Within Functional Limits Thoracic Assessment Thoracic Assessment: Within Functional Limits Lumbar Assessment Lumbar Assessment: Within Functional Limits  Balance Balance Balance Assessed: Yes Dynamic Sitting Balance Dynamic Sitting - Level of Assistance: 7: Independent Static Standing Balance Static Standing - Level of Assistance: 6: Modified independent (Device/Increase time) Dynamic Standing Balance Dynamic Standing - Balance Support: During functional activity Dynamic Standing - Level of Assistance: 6: Modified independent (Device/Increase time) Extremity Assessment      RLE Assessment RLE Assessment: Exceptions to [facility] General Strength  Comments: grossly 4+/5 except ankle DF 5/5 LLE Assessment LLE Assessment: Within Functional Limits General Strength Comments: grossly 4+/5 to 5/5   [name] 03/07/2022, 11:52 AM

## 2022-03-07 NOTE — Progress Notes (Signed)
Occupational Therapy Session Note  Patient Details  Name: Monique Henderson MRN: [id number] Date of Birth: Apr 28, 1951  Today's Date: 03/07/2022 OT Individual Time: 0900-1000 OT Individual Time Calculation (min): 60 min    Short Term Goals: Week 1:  OT Short Term Goal 1 (Week 1): STG = LTG 2/2 ELOS Week 2:     Skilled Therapeutic Interventions/Progress Updates:    Patient in bed upon arrival , she indicated that she had completed BADL related task in bathing and dressing and would prefer to work on addition functional deficits. The pt was able to simulate bathing exercise at EOB incorporating LUE as a prime mover, she was able to bathe UB/LB with SBA. The pt was able to transition from sit to stand with close S for washing her bottom.  The pt was able to donn a shirt, using the hemi tech with [place] following verbal and physical demonstration.  The pt was able to transfer from EOB to w/c LOF by ambulating to the w/c with close S and her gait belt in place. The pt was able to complete 1 set of 10 shld hicks in preparation for actively engaging her RUE.  The pt was able to complete scapular winging to enhance communication regarding proximal muscle in relation to mobility. The pt was instructed to come from sit to stand with the w/c locked and in place to fold towels.  The towel were  placed to the right of the pt on the bed for retrieval and then placed onto the bedside table for folding while standing in front of the w/c. The pt was able to come from sit to stand with close S by adjusting her anatomical position  for retreiving the towel and then place it on the table top with close S.  The pt was instructed to visualize the task prior to  manipulating the towel while using her eye for an additional sensory component to provide additional feedback.  The pt required hand over hand assistance initially, but was able to attempt grasping the towel, demonstrating supination and pronation for placement with  MinA. At the end of the treatment session hematology came in and inquired as to whether she could complete a blood sample on the pt , so the pt returned to bed LOF with close S  for a stand pivot transfer from w/c to bed LOF.  Prior to exiting, the bed side table and the call light were placed within reach, and the bed alarm was activated. All additional needs were addressed prior to exiting the room.    Therapy Documentation Precautions:  Precautions Precautions: Fall Precaution Comments: R hemiplegia Restrictions Weight Bearing Restrictions: No  Therapy/Group: Individual Therapy  Yvonne Kendall 03/07/2022, 4:39 PM

## 2022-03-07 NOTE — Progress Notes (Addendum)
Speech Language Pathology Discharge Summary  Patient Details  Name: Monique Henderson MRN: [id number] Date of Birth: [birth year]  Date of Discharge from [facility] 7, 2023  Today's Date: 03/07/2022 SLP Individual Time: 0723-0805 SLP Individual Time Calculation (min): 42 min   Skilled Therapeutic Interventions:  Skilled treatment session focused on dysphagia and communication goals. Upon arrival, patient was attempting to eat her breakfast. However, she had not been set-up appropriately and was having difficulty. SLP facilitated session by sitting patient EOB, turning on the lights, donning her dentures and cutting up her food. The patient reported she called for assistance but that no one came. Patient consumed meal with overt cough X 1, suspect due to dryness of food resulting in needing extra time for mastication and liquids for oral clearance. Supervision verbal cues were also needed to self-monitor and correct right anterior spillage. Throughout session, patient was ~80% intelligible with Min verbal cues for use of speech intelligibility strategies. SLP also provided verbal education with handouts that focused on speech intelligibility strategies, swallowing compensatory strategies and memory compensatory strategies. Patient verbalized understanding of all information. Patient left supine in bed with alarm on and all needs within reach.    Patient has met 5 of 6 long term goals.  Patient to discharge at overall Mod;Min level.   Reasons goals not met: Patient continues to require overall Mod A verbal cues for use of speech intelligibility strategies   Clinical Impression/Discharge Summary: Patient has made functional gains and has met 5 of 6 LTGs this admission. Currently, patient is consuming regular textures (due to the importance of consuming a renal diet secondary to ESRD) with thin liquids. Patient with minimal overt s/s of aspiration and requires intermittent supervision level  verbal cues for use of swallowing compensatory strategies. Extra time is also needed for PO intake for proper mastication due to continued moderate oral-motor weakness. Patient's moderate oral-motor weakness along with some aspect of suspected apraxia, patient requires Mod verbal, visual, and articulatory cues for use of speech intelligibility strategies at the word and phrase level to achieve 70-80% intelligibility. Patient demonstrates improved cognitive functioning and requires supervision-Min A verbal cues to complete functional and familiar tasks safely in regards to problem solving and recall. Patient education is complete and patient will discharge home with 24 hour supervision from friends and family. Patient would benefit from f/u SLP services to maximize her functional communication, cognitive functioning and swallowing function in order to reduce caregiver burden.    Care Partner:  Caregiver Able to Provide Assistance: Yes  Type of Caregiver Assistance: Physical;Cognitive  Recommendation:  24 hour supervision/assistance;Outpatient SLP  Rationale for SLP Follow Up: Reduce caregiver burden;Maximize functional communication;Maximize cognitive function and independence;Maximize swallowing safety   Equipment: N/A   Reasons for discharge: Discharged from hospital;Treatment goals met   Patient/Family Agrees with Progress Made and Goals Achieved: Yes    Johnathan Heskett, [place] 03/07/2022, 6:37 AM

## 2022-03-07 NOTE — Plan of Care (Signed)
  Problem: RH Balance Goal: LTG Patient will maintain dynamic sitting balance (PT) Description: LTG:  Patient will maintain dynamic sitting balance with assistance during mobility activities (PT) Outcome: Completed/Met Goal: LTG Patient will maintain dynamic standing balance (PT) Description: LTG:  Patient will maintain dynamic standing balance with assistance during mobility activities (PT) Outcome: Completed/Met   Problem: Sit to Stand Goal: LTG:  Patient will perform sit to stand with assistance level (PT) Description: LTG:  Patient will perform sit to stand with assistance level (PT) Outcome: Completed/Met   Problem: RH Bed Mobility Goal: LTG Patient will perform bed mobility with assist (PT) Description: LTG: Patient will perform bed mobility with assistance, with/without cues (PT). Outcome: Completed/Met   Problem: RH Bed to Chair Transfers Goal: LTG Patient will perform bed/chair transfers w/assist (PT) Description: LTG: Patient will perform bed to chair transfers with assistance (PT). Outcome: Completed/Met   Problem: RH Car Transfers Goal: LTG Patient will perform car transfers with assist (PT) Description: LTG: Patient will perform car transfers with assistance (PT). Outcome: Completed/Met   Problem: RH Furniture Transfers Goal: LTG Patient will perform furniture transfers w/assist (OT/PT) Description: LTG: Patient will perform furniture transfers  with assistance (OT/PT). Outcome: Completed/Met   Problem: RH Floor Transfers Goal: LTG Patient will perform floor transfers w/assist (PT) Description: LTG: Patient will perform floor transfers with assistance (PT). Outcome: Completed/Met   Problem: RH Ambulation Goal: LTG Patient will ambulate in controlled environment (PT) Description: LTG: Patient will ambulate in a controlled environment, # of feet with assistance (PT). Outcome: Completed/Met Goal: LTG Patient will ambulate in home environment (PT) Description: LTG:  Patient will ambulate in home environment, # of feet with assistance (PT). Outcome: Completed/Met   Problem: RH Stairs Goal: LTG Patient will ambulate up and down stairs w/assist (PT) Description: LTG: Patient will ambulate up and down # of stairs with assistance (PT) Outcome: Completed/Met

## 2022-03-07 NOTE — Progress Notes (Signed)
  [facility] Progress Note   Subjective:   Patient seen and examined at bedside.  Just completed PT, reports tolerating well.  Denies CP, SOB, orthopnea, abdominal pain, n/v/d, weakness, dizziness and fatigue.  Objective Vitals:   03/06/22 1416 03/06/22 1943 03/07/22 0428 03/07/22 0500  BP: 131/82 (!) 151/86 (!) 153/75   Pulse: 78 77 70   Resp: '19 18 18   '$ Temp: 97.8 F (36.6 C) 98.6 F (37 C) 97.7 F (36.5 C)   TempSrc:  Oral Oral   SpO2: 100% 100% 100%   Weight:    45.2 kg  Height:       Physical Exam General:chronically ill appearing, thin female in NAD, +dysarthria  Heart:RRR Lungs:CTAB, nml WOB on RA Abdomen:soft, NTND Extremities:no LE edema Dialysis Access: R [facility]   Filed Weights   03/05/22 2000 03/06/22 0347 03/07/22 0500  Weight: 45.8 kg 46.4 kg 45.2 kg    Intake/Output Summary (Last 24 hours) at 03/07/2022 1202 Last data filed at 03/07/2022 0700 Gross per 24 hour  Intake 480 ml  Output --  Net 480 ml    Additional Objective Labs: Basic Metabolic Panel: Recent Labs  Lab 03/03/22 0509 03/04/22 0705 03/05/22 0523  NA 133* 134* 135  K 6.1* 5.2* 5.1  CL 91* 92* 92*  CO2 '22 27 26  '$ GLUCOSE 84 91 86  BUN 87* 51* 92*  CREATININE 11.35* 7.38* 10.18*  CALCIUM 9.4 10.1 9.8  PHOS 10.6* 7.2* 7.6*   Liver Function Tests: Recent Labs  Lab 03/03/22 0509 03/04/22 0705 03/05/22 0523  ALBUMIN 3.5 3.7 3.4*   CBC: Recent Labs  Lab 03/03/22 0509 03/04/22 0705 03/05/22 1619  WBC 4.4 3.1* 4.0  NEUTROABS 2.6 1.9  --   HGB 11.7* 13.0 10.5*  HCT 35.5* 39.7 32.6*  MCV 93.7 95.7 95.9  PLT 258 257 234    Studies/Results: No results found.  Medications:  anticoagulant sodium citrate      amLODipine  10 mg Oral Daily   apixaban  2.5 mg Oral BID   carvedilol  6.25 mg Oral BID WC   cloNIDine  0.1 mg Transdermal Q Sat   isosorbide mononitrate  30 mg Oral Daily   lidocaine  1 patch Transdermal Q24H   rosuvastatin  10 mg Oral Daily    sevelamer carbonate  2.4 g Oral TID WC    Dialysis Orders: TTS at [facility] clinic 3:75 hrs 180NRE400/Autoflow 1.5 45 kg 2.0 K/ 2.0 Ca UFP 2 TDC no heparin - No ESA/Venofer - Hectorol 4 mcg IV TIW   Assessment/Plan: Acute cortical CVA: Dysarthric, R arm weakness.  Now in CIR. 2.  Hypertensive Emergency:  BP variable.  On amlodipine '10mg'$  qd, coreg 6.76mBID, clonidine 0.'1mg'$  patch. 3.  Volume - does not appear volume overloaded.  +/- EDW, may need to be lowered on d/c.  Get standing weight to better determine, UF as tolerated. 4.  PAF: On Eliquis (restarted 9/28) 5.  ESRD: Continue HD on TTS; Next HD today. Last K 5.1 today.   6.  Anemia: Hgb 10.5, no ESA needed. 7.  Metabolic bone disease: Ca super high on 9/29 and holding VDRA. Itchiness can also be from hyperphosphatemia. PO4 now ranging in 7s. Increase Renvela to 2.4g AC TID.  Check pth. May need to start sensipar.  8.  Nutrition - Albumin 3.4. Renal diet w/fluid restrictions. Protein supplements.   LJen Mow PA-C [facility] 03/07/2022,12:02 PM  LOS: 9 days

## 2022-03-07 NOTE — Plan of Care (Signed)
  Problem: RH Expression Communication Goal: LTG Patient will increase speech intelligibility (SLP) Description: LTG: Patient will increase speech intelligibility at word/phrase/conversation level with cues, % of the time (SLP) Outcome: Not Met (add Reason) Note: Patient needs Mod verbal cues for use of speech intelligibility strategies to achieve 75% intelligibility.    Problem: RH Swallowing Goal: LTG Patient will consume least restrictive diet using compensatory strategies with assistance (SLP) Description: LTG:  Patient will consume least restrictive diet using compensatory strategies with assistance (SLP) Outcome: Completed/Met Goal: LTG Pt will demonstrate functional change in swallow as evidenced by bedside/clinical objective assessment (SLP) Description: LTG: Patient will demonstrate functional change in swallow as evidenced by bedside/clinical objective assessment (SLP) Outcome: Completed/Met   Problem: RH Expression Communication Goal: LTG Patient will verbally express basic/complex needs(SLP) Description: LTG:  Patient will verbally express basic/complex needs, wants or ideas with cues  (SLP) Outcome: Completed/Met   Problem: RH Problem Solving Goal: LTG Patient will demonstrate problem solving for (SLP) Description: LTG:  Patient will demonstrate problem solving for basic/complex daily situations with cues  (SLP) Outcome: Completed/Met   Problem: RH Memory Goal: LTG Patient will use memory compensatory aids to (SLP) Description: LTG:  Patient will use memory compensatory aids to recall biographical/new, daily complex information with cues (SLP) Outcome: Completed/Met

## 2022-03-08 DIAGNOSIS — E875 Hyperkalemia: Secondary | ICD-10-CM

## 2022-03-08 NOTE — Progress Notes (Signed)
Inpatient Rehabilitation Discharge Medication Review by a Pharmacist  A complete drug regimen review was completed for this patient to identify any potential clinically significant medication issues.  High Risk Drug Classes Is patient taking? Indication by Medication  Antipsychotic No   Anticoagulant Yes Apixaban- PAF  Antibiotic No   Opioid No   Antiplatelet No   Hypoglycemics/insulin No   Vasoactive Medication Yes Norvasc, Coreg, Catapres, Imdur- HTN  Chemotherapy No   Other Yes Crestor- HLD Sevelamer- hyper phosphotemia Cholecalciferol - Vitamin D supplement Lidoderm topical patch-Left lower rib pain Albuterol HFA- wheezing or shortness of breath Trazodone -sleep Benadryl topical  for shoulder, back itching      Type of Medication Issue Identified Description of Issue Recommendation(s)  Drug Interaction(s) (clinically significant)     Duplicate Therapy     Allergy     No Medication Administration End Date     Incorrect Dose     Additional Drug Therapy Needed     Significant med changes from prior encounter (inform family/care partners about these prior to discharge).  PTA Hydralazine, Losartan,   Mirtazapine, torsemide discontinued.   Other       Clinically significant medication issues were identified that warrant physician communication and completion of prescribed/recommended actions by midnight of the next day:  No   Time spent performing this drug regimen review (minutes):  [place], PharmD, [facility] PGY1 Pharmacy Resident 03/08/2022 10:20 AM

## 2022-03-08 NOTE — Progress Notes (Signed)
[facility] Progress Note   Subjective:   Patient seen and examined in room.  Sitting on side of bed.  Reports she just finished breakfast.  Denies CP, SOB, abdominal pain, n/v/d, weakness, dizziness and fatigue.   Objective Vitals:   03/07/22 2008 03/08/22 0300 03/08/22 0504 03/08/22 0845  BP: (!) 176/93  (!) 181/106 129/88  Pulse: 80  68 81  Resp: 18  17   Temp: 97.6 F (36.4 C)  (!) 97.5 F (36.4 C)   TempSrc: Oral     SpO2: 100%  100%   Weight:  46.5 kg    Height:       Physical Exam General:chronically ill appearing, thin female in NAD, +dysarthria  Heart:RRR, no mrg Lungs:CTAB, nml WOB on RA Abdomen:soft, NTND Extremities:no LE edema Dialysis Access: R [facility]  Filed Weights   03/07/22 1439 03/07/22 1904 03/08/22 0300  Weight: 45.7 kg 42.1 kg 46.5 kg    Intake/Output Summary (Last 24 hours) at 03/08/2022 1046 Last data filed at 03/08/2022 0745 Gross per 24 hour  Intake 318 ml  Output 2000 ml  Net -1682 ml    Additional Objective Labs: Basic Metabolic Panel: Recent Labs  Lab 03/04/22 0705 03/05/22 0523 03/07/22 1452  NA 134* 135 135  K 5.2* 5.1 5.0  CL 92* 92* 99  CO2 '27 26 24  '$ GLUCOSE 91 86 131*  BUN 51* 92* 62*  CREATININE 7.38* 10.18* 9.82*  CALCIUM 10.1 9.8 9.5  PHOS 7.2* 7.6* 6.1*   Liver Function Tests: Recent Labs  Lab 03/04/22 0705 03/05/22 0523 03/07/22 1452  ALBUMIN 3.7 3.4* 3.3*   CBC: Recent Labs  Lab 03/03/22 0509 03/04/22 0705 03/05/22 1619 03/07/22 1452  WBC 4.4 3.1* 4.0 3.9*  NEUTROABS 2.6 1.9  --   --   HGB 11.7* 13.0 10.5* 10.6*  HCT 35.5* 39.7 32.6* 32.4*  MCV 93.7 95.7 95.9 96.7  PLT 258 257 234 242   Medications:   (feeding supplement) PROSource Plus  30 mL Oral BID BM   amLODipine  10 mg Oral Daily   apixaban  2.5 mg Oral BID   carvedilol  6.25 mg Oral BID WC   cloNIDine  0.1 mg Transdermal Q Sat   isosorbide mononitrate  30 mg Oral Daily   lidocaine  1 patch Transdermal Q24H   rosuvastatin   10 mg Oral Daily   sevelamer carbonate  2.4 g Oral TID WC    Dialysis Orders: TTS at [facility] clinic 3:75 hrs 180NRE400/Autoflow 1.5 45 kg 2.0 K/ 2.0 Ca UFP 2 TDC no heparin - No ESA/Venofer - Hectorol 4 mcg IV TIW   Assessment/Plan: Acute cortical CVA: Dysarthric, R arm weakness.  Now in CIR. 2.  Hypertensive Emergency:  BP variable.  On amlodipine '10mg'$  qd, coreg 6.80mBID, clonidine 0.'1mg'$  patch. 3.  Volume - does not appear volume overloaded.  +/- EDW, may need to be lowered on d/c.  Get standing weight to better determine, UF as tolerated. 4.  PAF: On Eliquis (restarted 9/28) 5.  ESRD: Continue HD on TTS; Next HD 03/10/22.  Last K 5.0 pre HD.   6.  Anemia: Hgb 10.6, no ESA needed. 7.  Metabolic bone disease: Ca super high on 9/29 and holding VDRA. Calcium improved.  Itchiness can also be from hyperphosphatemia. PO4 improved to 6.1 with increase Renvela dose, 2.4g AC TID.  Checking pth.   8.  Nutrition - Albumin 3.3. Renal diet w/fluid restrictions. Protein supplements.  LJen Mow PA-C [facility] 03/08/2022,10:46 AM  LOS: 10 days

## 2022-03-08 NOTE — Progress Notes (Signed)
PROGRESS NOTE   Subjective/Complaints:  She is looking forward to discharge later today.  Denies chest pain. No additional concerns.   ROS: Denies fevers, chills, N/V, abdominal pain, constipation, diarrhea, SOB, cough, chest pain, new weakness or paraesthesias.    Objective:   No results found. Recent Labs    03/05/22 1619 03/07/22 1452  WBC 4.0 3.9*  HGB 10.5* 10.6*  HCT 32.6* 32.4*  PLT 234 242    Recent Labs    03/07/22 1452  NA 135  K 5.0  CL 99  CO2 24  GLUCOSE 131*  BUN 62*  CREATININE 9.82*  CALCIUM 9.5     Intake/Output Summary (Last 24 hours) at 03/08/2022 1003 Last data filed at 03/08/2022 0745 Gross per 24 hour  Intake 318 ml  Output 2000 ml  Net -1682 ml         Physical Exam: Vital Signs Blood pressure 129/88, pulse 81, temperature (!) 97.5 F (36.4 C), resp. rate 17, height '5\' 3"'$  (1.6 m), weight 46.5 kg, SpO2 100 %. Physical Exam Constitutional: Appropriate appearance for age.  HENT: No JVD. Neck Supple. Trachea midline. Atraumatic, normocephalic. Eyes: PERRLA. Conjugate gaze Cardiovascular: RRR, no murmurs/rub/gallops. No Edema. Peripheral pulses 2+  Respiratory: CTAB. No rales, rhonchi, or wheezing. On RA. Normal rate Abdomen: + bowel sounds, normoactive. No distention or tenderness.  GU: Not examined. Skin: C/D/I. No apparent lesions. No apparent rash or erythema.  Right TDC Neurologic exam:  Cognition: AAO to person, place, time and event.  Language: Severe dysarthria much improved with top and bottom dentures, fluent. follows commands Insight: Good insight into current condition.  CN: + R facial droop, + R tongue deviation, + R shoulder weakness; otherwise intact MSK:      No apparent deformity. Moving all 4 limbs antigravity.       PE from prior exams: Strength:                RUE: 4-/5 SA, 3+/5 EF, 3+/5 EE, 4-/5 WE, 3/5 FF, 3/5 FA                 LUE: 5/5                  RLE: 5/5                 LLE:  5/5         Assessment/Plan: 1. Functional deficits which require 3+ hours per day of interdisciplinary therapy in a comprehensive inpatient rehab setting. Physiatrist is providing close team supervision and 24 hour management of active medical problems listed below. Physiatrist and rehab team continue to assess barriers to discharge/monitor patient progress toward functional and medical goals  Care Tool:  Bathing    Body parts bathed by patient: Right arm, Left arm, Chest, Abdomen, Front perineal area, Buttocks, Right upper leg, Left upper leg, Right lower leg, Left lower leg, Face   Body parts bathed by helper: Left arm, Right lower leg, Buttocks     Bathing assist Assist Level: Supervision/Verbal cueing     Upper Body Dressing/Undressing Upper body dressing   What is the patient wearing?: Pull over shirt  Upper body assist Assist Level: Supervision/Verbal cueing    Lower Body Dressing/Undressing Lower body dressing      What is the patient wearing?: Pants     Lower body assist Assist for lower body dressing: Supervision/Verbal cueing     Toileting Toileting    Toileting assist Assist for toileting: Contact Guard/Touching assist     Transfers Chair/bed transfer  Transfers assist     Chair/bed transfer assist level: Independent with assistive device     Locomotion Ambulation   Ambulation assist      Assist level: Independent with assistive device Assistive device: No Device Max distance: 500   Walk 10 feet activity   Assist     Assist level: Independent with assistive device Assistive device: No Device   Walk 50 feet activity   Assist    Assist level: Independent with assistive device Assistive device: No Device    Walk 150 feet activity   Assist    Assist level: Independent with assistive device Assistive device: No Device    Walk 10 feet on uneven surface  activity   Assist      Assist level: Independent with assistive device Assistive device: Other (comment) (no device)   Wheelchair     Assist Is the patient using a wheelchair?: No Type of Wheelchair: Manual    Wheelchair assist level: Contact Guard/Touching assist Max wheelchair distance: 30    Wheelchair 50 feet with 2 turns activity    Assist        Assist Level: Total Assistance - Patient < 25%   Wheelchair 150 feet activity     Assist      Assist Level: Total Assistance - Patient < 25%   Blood pressure 129/88, pulse 81, temperature (!) 97.5 F (36.4 C), resp. rate 17, height '5\' 3"'$  (1.6 m), weight 46.5 kg, SpO2 100 %.  Medical Problem List and Plan: 1. 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             -patient may shower             -ELOS/Goals: 10-14 days  -Continue CIR  -DC home today 2.  Antithrombotics: -DVT/anticoagulation:  Pharmaceutical: Eliquis             -antiplatelet therapy: none 3. Pain Management: Tylenol as needed            - Note: Was discharged from OP PM&R clinic 11/17/2010 by Dr. Lucia Estelle for medication noncompliance           - PRN lidocaine patch to left lower ribs for MSK pain  4. Mood/Behavior/Sleep: LCSW to evaluate and provide emotional support             -antipsychotic agents: n/a             - Neuropsych eval completed, no new recs  5. Neuropsych/cognition: This patient is capable of making decisions on her own behalf. 6. Skin/Wound Care: routine skin care checks.             - Added PRN topical benadryl for shoulder, back itching (per Nephro, likely d/t hyperphosphatemia).  7. Fluids/Electrolytes/Nutrition: strict Is and Os. Labs per nephrology             -dysphagia 2/thin liquids; SLP eval             -  Nephrology following for dialysis; defer  electrolyte management to them            - 10/3 Hyperphosphatemia/Hypercalcemia: Nephrology has been holding VDRA and trending. Phos 10+ today, discussed with them initiating Renvela, they are looking into binders. Appreciate insight and recommendations. Started low phos diet.            - 10/4 Lokelma and Renvela per Nephrology.           - 10/5 K improved to 5.1; continue Renvela, may add Sensipar per Nephro.  Appreciate management.   -10/8 K+ stable at 5.0  8: Hypertension: monitor             -continue amlodipine 10 mg daily             -continue carvedilol 6.25 mg daily             -continue clonidine 0.1 mg patch weekly (Sat)             -continue isosorbide mononitrate 30 mg daily  -10/2 BP intermittently elevated, no changes given variance with dialysis           - 10/5 - increased HTN today; no acute medication changes, add PRN hydralazine 10 mg for SBP >180, DBP >110            -10/6 Consistent elevations 150-160s; 130s this AM; will need close OP monitorring with PCP at discharge  10/8 Normotensive last BP check at 129/88 but continues to be intermittently elevated, continue close f/u with nephrology and PCP as outpatient   9: Hyperlipidemia: continue Crestor 10 mg daily 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 Chest pain - intermittent, L>R lower ribs, primarily on off-dialysis days       - 9/28 Tachycardia, irregular into 120s on initial exam; EKG unchanged. Troponin elevated, approx same as prior levels.  On chart review, several similar episodes while inpatient, consistently negative cardiac workup.        - 9/29 - 1x episode, vitals stable, self-resolved. Added lidocaine patch to lower chest.           - 10/1 - no episodes since dialysis  yesterday; stable          - 10/5 - 1x as above; reproducible with palpation; lido patch + tylenol LOS: 10 days A FACE TO FACE EVALUATION WAS PERFORMED  Jennye Boroughs 03/08/2022, 10:03 AM

## 2022-03-08 NOTE — Progress Notes (Signed)
Patient discharged to home. Transported via wheelchair by NT. Patient with no complaints and no distress. HD cath to right upper chest with dressing dry and intact and no signs or symptoms of infection.

## 2022-03-09 NOTE — Progress Notes (Signed)
Inpatient Rehabilitation Care Coordinator Discharge Note   Patient Details  Name: Monique Henderson MRN: [id number] Date of Birth: 1951/05/09   Discharge location: D/c to home  Length of Stay: 9 days  Discharge activity level: Mod I  Home/community participation: Limited  Patient response TX:HFSFSE Literacy - How often do you need to have someone help you when you read instructions, pamphlets, or other written material from your doctor or pharmacy?: Never  Patient response LT:RVUYEB Isolation - How often do you feel lonely or isolated from those around you?: Never  Services provided included: MD, RD, PT, OT, Pharmacy, SW, Neuropsych, TR, CM, RN, SLP  Financial Services:  Charity fundraiser Utilized: [employer]  Choices offered to/list presented to: yes  Follow-up services arranged:  Outpatient, DME    Outpatient Servicies: [facility] at [facility] for PT/OT/SLP DME : None    Patient response to transportation need: Is the patient able to respond to transportation needs?: Yes In the past 12 months, has lack of transportation kept you from medical appointments or from getting medications?: No In the past 12 months, has lack of transportation kept you from meetings, work, or from getting things needed for daily living?: No   Comments (or additional information):SW confirmed with pt on 10/9 that she would like to move forward with [facility] outpatient location. SW provided contact information for pt boyfriend Jeneen Rinks [phone] for follow-up about scheduling appointments.   Patient/Family verbalized understanding of follow-up arrangements:  Yes  Individual responsible for coordination of the follow-up plan: contact pt son Laurelyn Sickle (in system his name is [name]) [phone]  Confirmed correct DME delivered: Rana Snare 03/09/2022    Rana Snare

## 2022-03-09 NOTE — Plan of Care (Signed)
  Problem: RH Grooming Goal: LTG Patient will perform grooming w/assist,cues/equip (OT) Description: LTG: Patient will perform grooming with assist, with/without cues using equipment (OT) Outcome: Completed/Met   Problem: RH Bathing Goal: LTG Patient will bathe all body parts with assist levels (OT) Description: LTG: Patient will bathe all body parts with assist levels (OT) Outcome: Completed/Met   Problem: RH Dressing Goal: LTG Patient will perform upper body dressing (OT) Description: LTG Patient will perform upper body dressing with assist, with/without cues (OT). Outcome: Completed/Met Goal: LTG Patient will perform lower body dressing w/assist (OT) Description: LTG: Patient will perform lower body dressing with assist, with/without cues in positioning using equipment (OT) Outcome: Completed/Met   Problem: RH Toileting Goal: LTG Patient will perform toileting task (3/3 steps) with assistance level (OT) Description: LTG: Patient will perform toileting task (3/3 steps) with assistance level (OT)  Outcome: Completed/Met   Problem: RH Functional Use of Upper Extremity Goal: LTG Patient will use RT/LT upper extremity as a (OT) Description: LTG: Patient will use right/left upper extremity as a stabilizer/gross assist/diminished/nondominant/dominant level with assist, with/without cues during functional activity (OT) Outcome: Completed/Met   Problem: RH Toilet Transfers Goal: LTG Patient will perform toilet transfers w/assist (OT) Description: LTG: Patient will perform toilet transfers with assist, with/without cues using equipment (OT) Outcome: Completed/Met   

## 2022-03-10 LAB — PARATHYROID HORMONE, INTACT (NO CA): PTH: 159 pg/mL — ABNORMAL HIGH (ref 15–65)

## 2022-03-10 NOTE — TOC Transition Note (Signed)
Transition of care contact from inpatient facility  Date of discharge: 03/08/22 Date of contact: 03/10/22 Method: Phone Spoke to: Patient's Son  Patient's son contacted to discuss transition of care from recent inpatient hospitalization. Patient was admitted to [name] from 02/26/22-03/08/22 with discharge diagnosis of acute cortical CVA.  Patient's son reports he needs to follow-up with Monique Henderson on her follow-up appointments and home medications  Patient's next HD at her outpatient HD unit is scheduled on: 03/12/22 at [facility].  Tobie Poet, NP

## 2022-03-12 ENCOUNTER — Telehealth: Payer: Self-pay

## 2022-03-12 NOTE — Patient Outreach (Signed)
  Care Coordination TOC Note Transition Care Management Follow-up Telephone Call Date of discharge and from where: 03/08/2022-[place] How have you been since you were released from the hospital? Call completed with both patient an d her sig other-james. They voice that things going well. Patient is recovering. She still has some hemiparesis which they know is normal. She is supposed to get ongoing outpt therapy to begin on 03/28/22. Any questions or concerns? No  Items Reviewed: Did the pt receive and understand the discharge instructions provided? Yes  Medications obtained and verified? Yes  Other? No  Any new allergies since your discharge? No  Dietary orders reviewed? Yes Do you have support at home? Yes   Home Care and Equipment/Supplies: Were home health services ordered? not applicable If so, what is the name of the agency? N/A  Has the agency set up a time to come to the patient's home? not applicable Were any new equipment or medical supplies ordered?  No What is the name of the medical supply agency? N/A Were you able to get the supplies/equipment? not applicable Do you have any questions related to the use of the equipment or supplies? No  Functional Questionnaire: (I = Independent and D = Dependent) ADLs: Assist  Bathing/Dressing- Assist  Meal Prep- Assist  Eating- Assist  Maintaining continence- Assist  Transferring/Ambulation- Assist  Managing Meds- Assist  Follow up appointments reviewed:  PCP Hospital f/u appt confirmed?  Patient reports she has new PCP-unable to recall name or practice office. States she has it written down some where. Sig other will look for it . Noted on EMR that pt followed by Dr. Redmond Pulling but pt reported she does not recall MD. Appt already schedule with MD. Bonney Leitz other will call to verify if pt sees MD and follow up on the matter.   [facility] f/u appt confirmed?  Scheduled to see [facility] on 04/29/22 @ 9am. She also has appt with  physical medicine/rehab on 03/25/22. Are transportation arrangements needed? No  If their condition worsens, is the pt aware to call PCP or go to the Emergency Dept.? Yes Was the patient provided with contact information for the PCP's office or ED? Yes Was to pt encouraged to call back with questions or concerns? Yes  SDOH assessments and interventions completed:   Yes  Care Coordination Interventions Activated:  Yes   Care Coordination Interventions:  Education provided    Encounter Outcome:  Pt. Visit Completed    Enzo Montgomery, RN,BSN,CCM [facility] Telephonic Care Management Coordinator Direct Phone: [phone] Toll Free: [phone] Fax: [phone]

## 2022-03-25 ENCOUNTER — Encounter
Payer: Medicare Other | Attending: Physical Medicine and Rehabilitation | Admitting: Physical Medicine and Rehabilitation

## 2022-04-01 ENCOUNTER — Ambulatory Visit: Payer: Medicare Other | Admitting: Rehabilitative and Restorative Service Providers"

## 2022-04-01 ENCOUNTER — Encounter: Payer: Self-pay | Admitting: Physical Therapy

## 2022-04-01 ENCOUNTER — Other Ambulatory Visit: Payer: Self-pay

## 2022-04-01 ENCOUNTER — Encounter: Payer: Self-pay | Admitting: Family Medicine

## 2022-04-01 ENCOUNTER — Ambulatory Visit (INDEPENDENT_AMBULATORY_CARE_PROVIDER_SITE_OTHER): Payer: Medicare Other | Admitting: Family Medicine

## 2022-04-01 ENCOUNTER — Encounter: Payer: Self-pay | Admitting: Rehabilitative and Restorative Service Providers"

## 2022-04-01 ENCOUNTER — Ambulatory Visit: Payer: Medicare Other | Attending: Physical Medicine and Rehabilitation | Admitting: Physical Therapy

## 2022-04-01 VITALS — BP 163/94 | HR 87 | Temp 98.1°F | Resp 16 | Wt 107.0 lb

## 2022-04-01 DIAGNOSIS — R600 Localized edema: Secondary | ICD-10-CM | POA: Insufficient documentation

## 2022-04-01 DIAGNOSIS — M25641 Stiffness of right hand, not elsewhere classified: Secondary | ICD-10-CM

## 2022-04-01 DIAGNOSIS — M25631 Stiffness of right wrist, not elsewhere classified: Secondary | ICD-10-CM | POA: Insufficient documentation

## 2022-04-01 DIAGNOSIS — R262 Difficulty in walking, not elsewhere classified: Secondary | ICD-10-CM

## 2022-04-01 DIAGNOSIS — R41841 Cognitive communication deficit: Secondary | ICD-10-CM | POA: Diagnosis present

## 2022-04-01 DIAGNOSIS — F419 Anxiety disorder, unspecified: Secondary | ICD-10-CM | POA: Diagnosis not present

## 2022-04-01 DIAGNOSIS — M6281 Muscle weakness (generalized): Secondary | ICD-10-CM

## 2022-04-01 DIAGNOSIS — I10 Essential (primary) hypertension: Secondary | ICD-10-CM

## 2022-04-01 DIAGNOSIS — E7800 Pure hypercholesterolemia, unspecified: Secondary | ICD-10-CM

## 2022-04-01 DIAGNOSIS — R471 Dysarthria and anarthria: Secondary | ICD-10-CM | POA: Diagnosis present

## 2022-04-01 DIAGNOSIS — Z8673 Personal history of transient ischemic attack (TIA), and cerebral infarction without residual deficits: Secondary | ICD-10-CM

## 2022-04-01 DIAGNOSIS — N186 End stage renal disease: Secondary | ICD-10-CM

## 2022-04-01 DIAGNOSIS — F32A Depression, unspecified: Secondary | ICD-10-CM

## 2022-04-01 DIAGNOSIS — R278 Other lack of coordination: Secondary | ICD-10-CM | POA: Insufficient documentation

## 2022-04-01 DIAGNOSIS — I639 Cerebral infarction, unspecified: Secondary | ICD-10-CM

## 2022-04-01 DIAGNOSIS — Z7689 Persons encountering health services in other specified circumstances: Secondary | ICD-10-CM

## 2022-04-01 NOTE — Therapy (Signed)
OUTPATIENT OCCUPATIONAL THERAPY NEURO EVALUATION  Patient Name: Monique Henderson MRN: [id number] DOB:07-22-1950, 71 y.o., female Today's Date: 04/01/2022  PCP: N/A REFERRING PROVIDER: Bary Leriche, PA-C   OT End of Session - 04/01/22 1347     Visit Number 1    Number of Visits 12    Date for OT Re-Evaluation 05/15/22    OT Start Time 2703    OT Stop Time 1443    OT Time Calculation (min) 56 min    Activity Tolerance Patient tolerated treatment well;No increased pain;Patient limited by pain;Patient limited by fatigue    Behavior During Therapy [facility] for tasks assessed/performed             Past Medical History:  Diagnosis Date   AF (paroxysmal atrial fibrillation) ([place]) 05/29/2019   on Coumadin   Aortic atherosclerosis ([place]) 07/05/2019   Aortic dissection ([place]) 04/04/2019   s/p repair   Bone spur 2008   Right calcaneal foot spur   Breast cancer ([place]) 2004   Ductal carcinoma in situ of the left breast; S/P left partial mastectomy 02/26/2003; S/P re-excision of cranial and lateral margins11/18/2004.radiation   Cerebral thrombosis with cerebral infarction 05/22/2019   Chronic low back pain 06/22/2016   Chronic obstructive lung disease ([place]) 01/16/2017   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 Autumn Messing; S/P re-excision of superior and inferior margins 10/27/2012.    ESRD on hemodialysis ([place]) 05/29/2019   Essential hypertension 09/16/2006   GERD 09/16/2006   Hepatitis C    treated and RNA confirmed not detectable 01/2017   Insomnia 03/14/2015   Malnutrition of moderate degree 05/19/2019   Non compliance with medical treatment 12/04/2017   Normocytic anemia    With thrombocytosis   Osteoarthritis    Right ureteral stone 2002   S/P lumbar spinal fusion 01/18/2014   S/P lumbar decompressive laminectomy, fusion, and plating for lumbar spinal stenosis on 05/27/2009 by Dr. Eustace .  S/P anterolateral  retroperitoneal interbody fusion L2-3 utilizing a 8 mm peek interbody cage packed with morcellized allograft, and anterior lumbar plating L2-3 for recurrent disc herniation L2-3 with spinal stenosis on 01/18/2014 by Dr. Eustace .     Tobacco use disorder 04/19/2009   Uterine fibroid    Wears dentures    top   Past Surgical History:  Procedure Laterality Date   ANTERIOR LAT LUMBAR FUSION N/A 01/18/2014   Procedure: ANTERIOR LATERAL LUMBAR FUSION LUMBAR TWO-THREE;  Surgeon: Eustace , MD;  Location: [place] [facility];  Service: Neurosurgery;  Laterality: N/A;   ANTERIOR LUMBAR FUSION  01/18/2014   AV FISTULA PLACEMENT Left 04/20/2019   Procedure: ARTERIOVENOUS (AV) FISTULA CREATION;  Surgeon: [name], MD;  Location: [place];  Service: Vascular;  Laterality: Left;   BACK SURGERY     BREAST LUMPECTOMY Left 01/2003   BREAST LUMPECTOMY Right 2014   BREAST LUMPECTOMY WITH NEEDLE LOCALIZATION AND AXILLARY SENTINEL LYMPH NODE BX Right 10/13/2012   Procedure: BREAST LUMPECTOMY WITH NEEDLE LOCALIZATION;  Surgeon: [name], MD;  Location: [place];  Service: General;  Laterality: Right;  Right breast wire localized lumpectomy   INSERTION OF DIALYSIS CATHETER Right 04/20/2019   Procedure: INSERTION OF DIALYSIS CATHETER, right internal jugular;  Surgeon: [name], MD;  Location: [place];  Service: Vascular;  Laterality: Right;   INSERTION OF DIALYSIS CATHETER Right 09/24/2020   Procedure: INSERTION OF TUNNELED DIALYSIS CATHETER;  Surgeon: Rosetta Posner, MD;  Location[facility];  Service: Vascular;  Laterality: Right;   IR FLUORO GUIDE CV LINE RIGHT  09/22/2020   IR THORACENTESIS ASP PLEURAL SPACE W/IMG GUIDE  05/19/2019   IR US GUIDE VASC ACCESS LEFT  09/22/2020   IR US GUIDE VASC ACCESS RIGHT  09/22/2020   IR VENOCAVAGRAM SVC  09/22/2020   LAMINECTOMY  05/27/2009   Lumbar decompressive laminectomy, fusion and plating for lumbar spinal stensosis    LIGATION OF ARTERIOVENOUS  FISTULA Left 09/24/2020   Procedure: LIGATION OF LEFT ARM ARTERIOVENOUS  FISTULA;  Surgeon: Rosetta Posner, MD;  Location: [place];  Service: Vascular;  Laterality: Left;   LUMBAR LAMINECTOMY/DECOMPRESSION MICRODISCECTOMY Left 03/23/2013   Procedure: LUMBAR LAMINECTOMY/DECOMPRESSION MICRODISCECTOMY 1 LEVEL;  Surgeon: Eustace , MD;  Location: [place] [facility];  Service: Neurosurgery;  Laterality: Left;  LUMBAR LAMINECTOMY/DECOMPRESSION MICRODISCECTOMY 1 LEVEL   MASTECTOMY, PARTIAL Left 02/26/2003   ; S/P re-excision of cranial and lateral margins 04/19/2003.    RE-EXCISION OF BREAST CANCER,SUPERIOR MARGINS Right 10/27/2012   Procedure: RE-EXCISION OF BREAST CANCER,SUPERIOR and inferior MARGINS;  Surgeon: [name], MD;  Location: [place];  Service: General;  Laterality: Right;   RE-EXCISION OF BREAST LUMPECTOMY Left 04/2003   TEE WITHOUT CARDIOVERSION N/A 04/04/2019   Procedure: Transesophageal Echocardiogram ([place]);  Surgeon: Wonda Olds, MD;  Location: [place];  Service: Open Heart Surgery;  Laterality: N/A;   THORACIC AORTIC ANEURYSM REPAIR N/A 04/04/2019   Procedure: THORACIC ASCENDING ANEURYSM REPAIR (AAA)  USING 28 MM X 30 CM HEMASHIELD PLATINUM VASCULAR GRAFT;  Surgeon: Wonda Olds, MD;  Location: [facility] OR;  Service: Open Heart Surgery;  Laterality: N/A;   Patient Active Problem List   Diagnosis Date Noted   Acute cardioembolic stroke ([place]) 83/15/1761   Stroke (cerebrum) ([place]) 02/23/2022   Stroke ([place]) 02/23/2022   Hypervolemia associated with renal insufficiency 01/29/2022   Dyslipidemia 01/29/2022   DNR (do not resuscitate) 01/29/2022   Polysubstance abuse ([place]) 01/06/2022   Acute on chronic combined systolic and diastolic CHF (congestive heart failure) ([place]) 01/06/2022   Abdominal pain 60/73/7106   Acute metabolic encephalopathy 26/94/8546   Malignant HTN with heart disease, w/o CHF, 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 12/24/2021   History of hepatitis C  12/08/2021   Hypertensive urgency 10/29/2021   Fluid overload 10/29/2021   Acute on chronic HFrEF (heart failure with reduced ejection fraction) ([place]) 10/29/2021   Chronic anticoagulation 10/29/2021   Prolonged QT interval 10/29/2021   Leukopenia 10/29/2021   SOB (shortness of breath) 09/15/2021   Hypertensive emergency 08/23/2021   Altered mental status    ESRD (end stage renal disease) on dialysis with hyperkalemia and metabolic acidosis with elevated AG  08/12/2021   Malignant hypertension 06/19/2021   Anemia 06/05/2021   Pure hypercholesterolemia 02/14/2021   Acute pulmonary edema ([place]) 12/23/2020   Acute respiratory failure with hypoxia ([place]) 09/21/2020   Acute renal failure superimposed on chronic kidney disease ([place]) 09/21/2020   Community acquired pneumonia 09/21/2020   Uremia 27/07/5007   Metabolic acidosis with increased anion gap and accumulation of organic acids 09/21/2020   Hypertensive crisis 09/21/2020   Troponin level elevated 08/28/2019   Positive D dimer 08/28/2019   Hyperkalemia 08/28/2019   SVT (supraventricular tachycardia) 08/28/2019   SIRS (systemic inflammatory response syndrome) ([place]) 08/27/2019   Paroxysmal SVT (supraventricular tachycardia) 08/27/2019   Aortic atherosclerosis ([place]) 07/05/2019   Chest pain 07/05/2019   History of CVA (cerebrovascular accident) 05/29/2019  AF (paroxysmal atrial fibrillation) ([place]) 05/29/2019   ESRD on dialysis ([place]) 05/29/2019   Cerebral thrombosis with cerebral infarction 05/22/2019   Malnutrition of moderate degree 05/19/2019   Pleural effusion 05/18/2019   Atrial fibrillation with RVR ([place]) 05/18/2019   S/P aortic aneurysm repair 04/07/2019   Aortic dissection ([place]) 04/04/2019   Dissection of aorta ([place]) 04/03/2019   Non compliance with medical treatment 12/04/2017   Chronic obstructive lung disease ([place]) 01/16/2017   Chronic low back pain 06/22/2016   Insomnia 03/14/2015   S/P lumbar spinal fusion 01/18/2014    Tobacco use disorder 04/19/2009   GERD 09/16/2006    ONSET DATE:  DOI: 02/22/22 (CVA)  REFERRING DIAG: I63.9 (ICD-10-CM) - Cerebral infarction, unspecified  THERAPY DIAG:  Muscle weakness (generalized)  Localized edema  Other lack of coordination  Stiffness of right wrist, not elsewhere classified  Stiffness of right hand, not elsewhere classified  Rationale for Evaluation and Treatment: Rehabilitation  SUBJECTIVE:   SUBJECTIVE STATEMENT: She is a retired female living in an [place] situation by herself. She states she had a stroke in the night, didn't go to the hospital until the next day.   Pt accompanied by: her sister (she has 9 siblings and gets a lot of support from them)  PERTINENT HISTORY: She suffered a stroke on 02/22/22 to her Lt posterior-frontal parietal and anterior-parietal areas causing dysphagia, right hemiplegia, speech issues, and cognitive problems. She spent ~2 weeks in the hospital, in inpatient rehab. At discharge from inpatient rehab, she was noted to have: no right handed grip, 75% intelligible speech, SBA with no device for transfers, dysphagia, HTN, HLD, tachycardia, A-fib, CKD, and chest pain (other than cardiac).   PRECAUTIONS: Fall and Other: Rt hemiplegia, speech, swallow and cognitive issues  WEIGHT BEARING RESTRICTIONS: No  PAIN:  Are you having pain? No Rating: 0/10 at rest now and none in the past week.   FALLS: Has patient fallen in last 6 months? No  LIVING ENVIRONMENT: Lives with: lives alone and was with boyfriend, but he left, family check on her daily  Lives in: House/apartment Stairs: Yes: Internal: 12 steps; on right going up and External: 2-3 steps; on left going up She has a chair lift but it's broken now  Has following equipment at home: Single point cane and Walker - 2 wheeled  PLOF: Independent  PATIENT GOALS: To get right dom arm moving better.    OBJECTIVE: (All objective assessments below are from initial evaluation on:  04/01/22 unless otherwise specified.)    HAND DOMINANCE: Right   ADLs: Overall ADLs: States decreased ability to grab, hold household objects, pain and inability to open containers, perform FMS tasks (manipulate fasteners on clothing), mild to moderate bathing problems as well.    FUNCTIONAL OUTCOME MEASURES: Eval: Quck DASH 38.6% impairment today  (Higher % Score  =  More Impairment)     UPPER EXTREMITY ROM     Shoulder to Wrist AROM Right eval  Shoulder flexion 108* in scaption  Shoulder abduction 128*  Shoulder extension 68  Shoulder internal rotation   Shoulder external rotation   Elbow flexion 130  Elbow extension (-10)  Forearm supination 65  Forearm pronation  65  Wrist flexion 15  Wrist extension 4  Wrist ulnar deviation 19  Wrist radial deviation 5  (Blank rows = not tested)  Eval: She has some (~10-15*) total motion in composite finger flexion and extension today with prolonged squeezes. Thumb motion is nil.   Hand AROM Right eval  Full Fist Ability (or Gap to Distal Palmar Crease) 5.5cm gap from MF tip to [facility]  Thumb Opposition to Small Finger (or Gap) unable  Thumb Opposition to Base of Small Finger (or Gap)  unable  (Blank rows = not tested)   UPPER EXTREMITY MMT:    Eval:  Right Arm: Grossly 3+/5 at shoulder, 3+/5 at elbow, 3-/5 at forearm, 2-/5 at wrist and 1+/5 in fingers  MMT Right TBD  Shoulder flexion   Shoulder abduction   Shoulder adduction   Shoulder extension   Shoulder internal rotation   Shoulder external rotation   Middle trapezius   Lower trapezius   Elbow flexion   Elbow extension   Forearm supination   Forearm pronation   Wrist flexion   Wrist extension   Wrist ulnar deviation   Wrist radial deviation   (Blank rows = not tested)  HAND FUNCTION: Eval: Observed weakness in affected hand.  Grip strength Right: 0 lbs, Left: 26 lbs   COORDINATION: Eval: Observed coordination impairments with affected rt hand. Box and Blocks  Test: Right unable to grasp a block today (TBD is [facility])  SENSATION: Eval:  Light touch intact today, equal b/l per pt  EDEMA:   Eval:  Moderately swollen in Rt hand, 41.5cm figure of 8 around Rt hand (compared to 38 Lt hand)   COGNITION: Eval: Overall cognitive status: overtly seemed [facility] for evaluation today, though perhaps a bit impulsive.  She is working with SLP for this and speech  OBSERVATIONS:   Eval: She has fairly good motion at shoulder and elbow, speech can be difficult to understand    TODAY'S TREATMENT:  Post-evaluation treatment: OT provides initial education for recommendations after stroke including safety with recognizing new stroke signs, importance of trying as many safe activities with affected hand as possible, prolonged muscle contractions for slower fiber recruitment, and repetitive use and blocked practice of motions 6x day 20 reps.  She was shown how to support FA on table and stabilize with Lt hand to perform wrist flex, ext, and deviations with long (5-10 sec) muscle squeezes.  She has fair success with this technique, eliciting some motion. She is told to do 6x day or every hour if possible.     PATIENT EDUCATION: Education details: See tx section above for details  Person educated: Patient Education method: Verbal Instruction, Teach back, Handouts  Education comprehension: States and demonstrates understanding, Additional Education required    HOME EXERCISE PROGRAM: See tx section above for details    GOALS: Goals reviewed with patient? Yes  SHORT TERM GOALS: Target date: 03/17/22  Pt will demo/state understanding of initial home exercise program (HEP) to improve function, pain, and independence.   Baseline: Needs a plan for rehabilitation  Goal status: INITIAL  2.  Pt will obtain protective, custom or mobilizing orthotic for safety and to improve function.  Baseline: Needs orthotic support  Goal status: TBD (This may be necessary to protect hand,  if motion does not improve.)    LONG TERM GOALS: Target date: 05/15/22  Pt will improve functional ability by decreased impairment per Quick DASH assessment to 15% or better, for better quality of life. Baseline: 38.6% Goal status: INITIAL  2.  Pt will improve A/ROM in Rt wrist flex/ext to at least 40* each, to have prerequisite/functional motion for tasks like reach and grasp.  Baseline: 15* / 4* respectively  Goal status: INITIAL  3.  Pt will improve strength in Rt elbow to at least 4+/5 MMT  to have increased functional ability to carry out selfcare and higher-level homecare tasks with no difficulty Baseline: 3+/5 MMT Goal status: INITIAL  4.  Pt will improve coordination skills in Rt hand, as seen by increasing score on box and blocks testing to at least 20 blocks to have increased functional ability to carry out fine motor tasks (fasteners, etc.) and more complex, coordinated IADLs (meal prep, sports, etc.).  Baseline: unable to grab a block Goal status: INITIAL  5.   Pt will improve grip strength in effected hand to at least 15# lbs for functional use at home and in IADLs. Baseline: 0 lbs Goal status: INITIAL   ASSESSMENT:  CLINICAL IMPRESSION: Patient is a 71 y.o. female who was seen today for occupational therapy evaluation for weakness and decreased functional ability after stroke. She will benefit from OP OT to improve quality of life.   PERFORMANCE DEFICITS: in functional skills including ADLs, IADLs, coordination, dexterity, proprioception, edema, tone, ROM, strength, fascial restrictions, flexibility, Fine motor control, Gross motor control, mobility, balance, body mechanics, endurance, decreased knowledge of precautions, and UE functional use, cognitive skills including attention, problem solving, and safety awareness, and psychosocial skills including coping strategies, environmental adaptation, and routines and behaviors.   IMPAIRMENTS: are limiting patient from ADLs,  IADLs, rest and sleep, work, leisure, and social participation.   CO-MORBIDITIES; has co-morbidities such as chest pain, HTN, HLD, speech and cognition issues, CKD on HD, hx of breast CA, lumbar fusion (>10 years ago), and others   that affects occupational performance. Patient will benefit from skilled OT to address above impairments and improve overall function.  MODIFICATION OR ASSISTANCE TO COMPLETE EVALUATION: Min-Moderate modification of tasks or assist with assess necessary to complete an evaluation.  OT OCCUPATIONAL PROFILE AND HISTORY: Detailed assessment: Review of records and additional review of physical, cognitive, psychosocial history related to current functional performance.  CLINICAL DECISION MAKING: LOW - limited treatment options, no task modification necessary  REHAB POTENTIAL: Excellent  EVALUATION COMPLEXITY: Low    PLAN:  OT FREQUENCY: 2x/week  OT DURATION: 6 weeks  PLANNED INTERVENTIONS: self care/ADL training, therapeutic exercise, therapeutic activity, neuromuscular re-education, manual therapy, passive range of motion, splinting, electrical stimulation, ultrasound, compression bandaging, moist heat, cryotherapy, contrast bath, patient/family education, coping strategies training, and DME and/or AE instructions  RECOMMENDED OTHER SERVICES: she is already receiving SLP and PT, perhaps would benefit from nutrition consult, if not already going, for CKD and stroke diet recommendations   CONSULTED AND AGREED WITH PLAN OF CARE: Patient and family member/caregiver  PLAN FOR NEXT SESSION: Review goals, HEP and initial recommendations.  Give printed, more comprehensive HEP, etc.    Benito Mccreedy, OTR/L, CHT 04/01/2022, 3:20 PM

## 2022-04-01 NOTE — Therapy (Signed)
OUTPATIENT PHYSICAL THERAPY NEURO EVALUATION   Patient Name: Monique Henderson MRN: [id number] DOB:1950/08/25, 71 y.o., female Today's Date: 04/01/2022   PCP: Dorna Mai REFERRING PROVIDER: Love    Past Medical History:  Diagnosis Date   AF (paroxysmal atrial fibrillation) ([place]) 05/29/2019   on Coumadin   Aortic atherosclerosis ([place]) 07/05/2019   Aortic dissection ([place]) 04/04/2019   s/p repair   Bone spur 2008   Right calcaneal foot spur   Breast cancer ([place]) 2004   Ductal carcinoma in situ of the left breast; S/P left partial mastectomy 02/26/2003; S/P re-excision of cranial and lateral margins11/18/2004.radiation   Cerebral thrombosis with cerebral infarction 05/22/2019   Chronic low back pain 06/22/2016   Chronic obstructive lung disease ([place]) 01/16/2017   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 Autumn Messing; S/P re-excision of superior and inferior margins 10/27/2012.    ESRD on hemodialysis ([place]) 05/29/2019   Essential hypertension 09/16/2006   GERD 09/16/2006   Hepatitis C    treated and RNA confirmed not detectable 01/2017   Insomnia 03/14/2015   Malnutrition of moderate degree 05/19/2019   Non compliance with medical treatment 12/04/2017   Normocytic anemia    With thrombocytosis   Osteoarthritis    Right ureteral stone 2002   S/P lumbar spinal fusion 01/18/2014   S/P lumbar decompressive laminectomy, fusion, and plating for lumbar spinal stenosis on 05/27/2009 by Dr. Eustace Moore.  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 Eustace Moore.     Tobacco use disorder 04/19/2009   Uterine fibroid    Wears dentures    top   Past Surgical History:  Procedure Laterality Date   ANTERIOR LAT LUMBAR FUSION N/A  01/18/2014   Procedure: ANTERIOR LATERAL LUMBAR FUSION LUMBAR TWO-THREE;  Surgeon: Eustace Moore, MD;  Location: [place] [facility]S;  Service: Neurosurgery;  Laterality: N/A;   ANTERIOR LUMBAR FUSION  01/18/2014   AV FISTULA PLACEMENT Left 04/20/2019   Procedure: ARTERIOVENOUS (AV) FISTULA CREATION;  Surgeon: [name], MD;  Location: [place];  Service: Vascular;  Laterality: Left;   BACK SURGERY     BREAST LUMPECTOMY Left 01/2003   BREAST LUMPECTOMY Right 2014   BREAST LUMPECTOMY WITH NEEDLE LOCALIZATION AND AXILLARY SENTINEL LYMPH NODE BX Right 10/13/2012   Procedure: BREAST LUMPECTOMY WITH NEEDLE LOCALIZATION;  Surgeon: [name], MD;  Location: [place];  Service: General;  Laterality: Right;  Right breast wire localized lumpectomy   INSERTION OF DIALYSIS CATHETER Right 04/20/2019   Procedure: INSERTION OF DIALYSIS CATHETER, right internal jugular;  Surgeon: [name], MD;  Location: [place];  Service: Vascular;  Laterality: Right;   INSERTION OF DIALYSIS CATHETER Right 09/24/2020   Procedure: INSERTION OF TUNNELED DIALYSIS CATHETER;  Surgeon: Rosetta Posner, MD;  [facility];  Service: Vascular;  Laterality: Right;   IR FLUORO GUIDE CV LINE RIGHT  09/22/2020   IR THORACENTESIS ASP PLEURAL SPACE W/IMG GUIDE  05/19/2019   IR US GUIDE VASC ACCESS LEFT  09/22/2020   IR US GUIDE VASC ACCESS RIGHT  09/22/2020   IR VENOCAVAGRAM SVC  09/22/2020   LAMINECTOMY  05/27/2009   Lumbar decompressive laminectomy, fusion and plating for lumbar spinal stensosis   LIGATION OF ARTERIOVENOUS  FISTULA Left 09/24/2020  Procedure: LIGATION OF LEFT ARM ARTERIOVENOUS  FISTULA;  Surgeon: Rosetta Posner, MD;  Location: [place];  Service: Vascular;  Laterality: Left;   LUMBAR LAMINECTOMY/DECOMPRESSION MICRODISCECTOMY Left 03/23/2013   Procedure: LUMBAR LAMINECTOMY/DECOMPRESSION MICRODISCECTOMY 1 LEVEL;  Surgeon: Eustace Moore, MD;  Location: [place] [facility]UR[facility]S;  Service: Neurosurgery;   Laterality: Left;  LUMBAR LAMINECTOMY/DECOMPRESSION MICRODISCECTOMY 1 LEVEL   MASTECTOMY, PARTIAL Left 02/26/2003   ; S/P re-excision of cranial and lateral margins 04/19/2003.    RE-EXCISION OF BREAST CANCER,SUPERIOR MARGINS Right 10/27/2012   Procedure: RE-EXCISION OF BREAST CANCER,SUPERIOR and inferior MARGINS;  Surgeon: [name], MD;  Location: [place];  Service: General;  Laterality: Right;   RE-EXCISION OF BREAST LUMPECTOMY Left 04/2003   TEE WITHOUT CARDIOVERSION N/A 04/04/2019   Procedure: Transesophageal Echocardiogram (Tee);  Surgeon: Wonda Olds, MD;  Location: [place];  Service: Open Heart Surgery;  Laterality: N/A;   THORACIC AORTIC ANEURYSM REPAIR N/A 04/04/2019   Procedure: THORACIC ASCENDING ANEURYSM REPAIR (AAA)  USING 28 MM X 30 CM HEMASHIELD PLATINUM VASCULAR GRAFT;  Surgeon: Wonda Olds, MD;  [facility];  Service: Open Heart Surgery;  Laterality: N/A;   Patient Active Problem List   Diagnosis Date Noted   Acute cardioembolic stroke ([place]) 37/16/9678   Stroke (cerebrum) ([place]) 02/23/2022   Stroke ([place]) 02/23/2022   Hypervolemia associated with renal insufficiency 01/29/2022   Dyslipidemia 01/29/2022   DNR (do not resuscitate) 01/29/2022   Polysubstance abuse ([place]) 01/06/2022   Acute on chronic combined systolic and diastolic CHF (congestive heart failure) ([place]) 01/06/2022   Abdominal pain 93/81/0175   Acute metabolic encephalopathy 03/25/8526   Malignant HTN with heart disease, w/o CHF, 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 12/24/2021   History of hepatitis C 12/08/2021   Hypertensive urgency 10/29/2021   Fluid overload 10/29/2021   Acute on chronic HFrEF (heart failure with reduced ejection fraction) ([place]) 10/29/2021   Chronic anticoagulation 10/29/2021   Prolonged QT interval 10/29/2021   Leukopenia 10/29/2021   SOB (shortness of breath) 09/15/2021   Hypertensive emergency 08/23/2021   Altered mental status    ESRD (end stage renal disease) on dialysis with  hyperkalemia and metabolic acidosis with elevated AG  08/12/2021   Malignant hypertension 06/19/2021   Anemia 06/05/2021   Pure hypercholesterolemia 02/14/2021   Acute pulmonary edema ([place]) 12/23/2020   Acute respiratory failure with hypoxia ([place]) 09/21/2020   Acute renal failure superimposed on chronic kidney disease ([place]) 09/21/2020   Community acquired pneumonia 09/21/2020   Uremia 78/24/2353   Metabolic acidosis with increased anion gap and accumulation of organic acids 09/21/2020   Hypertensive crisis 09/21/2020   Troponin level elevated 08/28/2019   Positive D dimer 08/28/2019   Hyperkalemia 08/28/2019   SVT (supraventricular tachycardia) 08/28/2019   SIRS (systemic inflammatory response syndrome) ([place]) 08/27/2019   Paroxysmal SVT (supraventricular tachycardia) 08/27/2019   Aortic atherosclerosis ([place]) 07/05/2019   Chest pain 07/05/2019   History of CVA (cerebrovascular accident) 05/29/2019   AF (paroxysmal atrial fibrillation) ([place]) 05/29/2019   ESRD on dialysis ([place]) 05/29/2019   Cerebral thrombosis with cerebral infarction 05/22/2019   Malnutrition of moderate degree 05/19/2019   Pleural effusion 05/18/2019   Atrial fibrillation with RVR ([place]) 05/18/2019   S/P aortic aneurysm repair 04/07/2019   Aortic dissection ([place]) 04/04/2019   Dissection of aorta ([place]) 04/03/2019   Non compliance with medical treatment 12/04/2017   Chronic obstructive lung disease ([place]) 01/16/2017   Chronic low back pain 06/22/2016   Insomnia 03/14/2015   S/P  lumbar spinal fusion 01/18/2014   Tobacco use disorder 04/19/2009   GERD 09/16/2006    ONSET DATE: 02/24/22  REFERRING DIAG: Acute Stroke  THERAPY DIAG:  No diagnosis found.  Rationale for Evaluation and Treatment: Rehabilitation  SUBJECTIVE:                                                                                                                                                                                              SUBJECTIVE STATEMENT:   Pt is a 71 y.o. female who presents to [facility] on [facility]25 with R sided facial droop, R UE weakness, dysarthria. MRI revealed acute to subacute cortical infarct in the L posterior frontal and anterior parietal lobe, smaller acute to subacute infarct in L caudate tail, occipital lobe and frontal lobe. Noted 8/31 admission due to volume overload.  PMH includes ESRD on HD TTS, HTN, PAF, polysubstance abuse, aortic aneurysm dissection s/p repair.     Pt accompanied by: self  PERTINENT HISTORY: see above  PAIN:  Are you having pain? No  PRECAUTIONS: None  WEIGHT BEARING RESTRICTIONS: No  FALLS: Has patient fallen in last 6 months? No  LIVING ENVIRONMENT: Lives with: lives with an adult companion Lives in: House/apartment Stairs: Yes: Internal: 15 steps; on left going up Has following equipment at home: None  PLOF:  cook, clean, some flowers  PATIENT GOALS: walk better,   OBJECTIVE:   COGNITION: Overall cognitive status: Within functional limits for tasks assessed   SENSATION: WFL  COORDINATION: LE pretty good, some difficulty with foot motions/ankle   LOWER EXTREMITY ROM:     Active  Right Eval Left Eval  Hip flexion 90   Hip extension    Hip abduction    Hip adduction    Hip internal rotation    Hip external rotation    Knee flexion    Knee extension 6   Ankle dorsiflexion 10   Ankle plantarflexion    Ankle inversion 5   Ankle eversion 12    (Blank rows = not tested)  LOWER EXTREMITY MMT:    MMT Right Eval Left Eval  Hip flexion 4-   Hip extension    Hip abduction 4   Hip adduction 4   Hip internal rotation    Hip external rotation    Knee flexion 4-   Knee extension 3+   Ankle dorsiflexion 4-   Ankle plantarflexion 4-   Ankle inversion 4-   Ankle eversion 4-   (Blank rows = not tested)  TRANSFERS: Assistive device utilized: None  Sit to stand: Complete Independence Stand to sit: Complete Independence  STAIRS: Level  of  Assistance: SBA Stair Negotiation Technique: Alternating Pattern  with Single Rail on Right Number of Stairs: 4  Height of Stairs: 6  Comments: some impulsivity and unsafe with turns  GAIT: Gait pattern: WFL Distance walked: 60 feet Assistive device utilized: None Level of assistance: SBA Comments: some issues with turns and on uneven surfaces  FUNCTIONAL TESTS:  5 times sit to stand: 16 Timed up and go (TUG): 13 Berg Balance Scale: 48/56 TODAY'S TREATMENT:                                                                                                                              DATE:     PATIENT EDUCATION: Education details: POC Person educated: Patient and sister Education method: Explanation Education comprehension: verbalized understanding  HOME EXERCISE PROGRAM: TBD for LE and balance   GOALS: Goals reviewed with patient? Yes  SHORT TERM GOALS: Target date: 04/15/22  Independent with initial HEP Goal status: INITIAL  LONG TERM GOALS: Target date: 06/24/21  Increase Berg balance score to 52/56 Goal status: INITIAL  2.  Understand safety and fall risks at home Goal status: INITIAL  3.  Increase right LE strength to 4+/5 Goal status: INITIAL  4.  Walk >1000 feet uneven terrain without difficulty Goal status: INITIAL  ASSESSMENT:  CLINICAL IMPRESSION: Patient is a 71 y.o. female who was seen today for physical therapy evaluation and treatment for CVA with right hemiplegia, her UE and speech and face are the most affected, she has some limitations in the right LE strength and balance, has some impulsivity which may lead to increased fall risk.  There is mild strength deficits mostly at the knee and some at the ankle, some coordination issues at the ankle.   OBJECTIVE IMPAIRMENTS: Abnormal gait, cardiopulmonary status limiting activity, decreased activity tolerance, decreased balance, decreased coordination, decreased endurance, decreased mobility, difficulty  walking, decreased ROM, decreased strength, and decreased safety awareness.   REHAB POTENTIAL: Good  CLINICAL DECISION MAKING: Stable/uncomplicated  EVALUATION COMPLEXITY: Low  PLAN:  PT FREQUENCY: 2x/week  PT DURATION: 12 weeks  PLANNED INTERVENTIONS: Therapeutic exercises, Therapeutic activity, Neuromuscular re-education, Balance training, Gait training, Patient/Family education, Self Care, Joint mobilization, Stair training, and Manual therapy  PLAN FOR NEXT SESSION: Overall she is progressing well after stroke, she has the most deficits with her right hand and her speech, some deficits with strength and motions of the LE, some impulsivity and balance issues   Sumner Boast, PT 04/01/2022, 12:43 PM

## 2022-04-02 ENCOUNTER — Encounter: Payer: Self-pay | Admitting: Family Medicine

## 2022-04-02 NOTE — Progress Notes (Signed)
New Patient Office Visit  Subjective    Patient ID: Monique Henderson, female    DOB: [birth year]  Age: 71 y.o. MRN: [id number]  CC:  Chief Complaint  Patient presents with   Follow-up    HFU    HPI Monique Henderson presents to establish care and for hospital follow up where she was recently discharged after having dx of stroke. Patient is here with sister. She reports improvements since in hospital. She denies acute complaints.    Outpatient Encounter Medications as of 04/01/2022  Medication Sig   albuterol (VENTOLIN HFA) 108 (90 Base) MCG/ACT inhaler Inhale 2 puffs into the lungs every 6 (six) hours as needed for wheezing or shortness of breath.   amLODipine (NORVASC) 10 MG tablet Take 1 tablet (10 mg total) by mouth daily.   apixaban (ELIQUIS) 2.5 MG TABS tablet Take 1 tablet (2.5 mg total) by mouth 2 (two) times daily.   carvedilol (COREG) 6.25 MG tablet Take 1 tablet (6.25 mg total) by mouth 2 (two) times daily with a meal.   Cholecalciferol (VITAMIN D-3 PO) Take 1 capsule by mouth daily.   cloNIDine (CATAPRES - DOSED IN MG/24 HR) 0.1 mg/24hr patch Place 1 patch (0.1 mg total) onto the skin every Saturday.   diphenhydrAMINE-zinc acetate (BENADRYL) cream Apply topically 3 (three) times daily as needed for itching.   isosorbide mononitrate (IMDUR) 30 MG 24 hr tablet Take 1 tablet (30 mg total) by mouth daily.   lidocaine (LIDODERM) 5 % Place 1 patch onto the skin daily. Remove & Discard patch within 12 hours or as directed by MD   oxyCODONE-acetaminophen (PERCOCET) 10-325 MG tablet Take 1 tablet by mouth 3 (three) times daily as needed.   rosuvastatin (CRESTOR) 10 MG tablet Take 1 tablet (10 mg total) by mouth daily.   sevelamer carbonate (RENVELA) 2.4 g PACK Take 2.4 g by mouth 3 (three) times daily with meals.   traZODone (DESYREL) 50 MG tablet Take 0.5 tablets (25 mg total) by mouth at bedtime as needed for sleep.   No facility-administered encounter medications on file as of  04/01/2022.    Past Medical History:  Diagnosis Date   AF (paroxysmal atrial fibrillation) ([place]) 05/29/2019   on Coumadin   Aortic atherosclerosis ([place]) 07/05/2019   Aortic dissection ([place]) 04/04/2019   s/p repair   Bone spur 2008   Right calcaneal foot spur   Breast cancer ([place]) 2004   Ductal carcinoma in situ of the left breast; S/P left partial mastectomy 02/26/2003; S/P re-excision of cranial and lateral margins11/18/2004.radiation   Cerebral thrombosis with cerebral infarction 05/22/2019   Chronic low back pain 06/22/2016   Chronic obstructive lung disease ([place]) 01/16/2017   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 Autumn Messing; S/P re-excision of superior and inferior margins 10/27/2012.    ESRD on hemodialysis ([place]) 05/29/2019   Essential hypertension 09/16/2006   GERD 09/16/2006   Hepatitis C    treated and RNA confirmed not detectable 01/2017   Insomnia 03/14/2015   Malnutrition of moderate degree 05/19/2019   Non compliance with medical treatment 12/04/2017   Normocytic anemia    With thrombocytosis   Osteoarthritis    Right ureteral stone 2002   S/P lumbar spinal fusion 01/18/2014   S/P lumbar decompressive laminectomy, fusion, and plating for lumbar spinal stenosis on 05/27/2009 by Dr. Eustace Moore.  S/P anterolateral retroperitoneal interbody fusion L2-3 utilizing a 8 mm peek interbody  cage packed with morcellized allograft, and anterior lumbar plating L2-3 for recurrent disc herniation L2-3 with spinal stenosis on 01/18/2014 by Dr. Eustace Moore.     Tobacco use disorder 04/19/2009   Uterine fibroid    Wears dentures    top    Past Surgical History:  Procedure Laterality Date   ANTERIOR LAT LUMBAR FUSION N/A 01/18/2014   Procedure: ANTERIOR LATERAL LUMBAR FUSION LUMBAR TWO-THREE;  Surgeon: Eustace Moore, MD;  Location: [place] [facility]UR[facility] ORS;  Service: Neurosurgery;  Laterality: N/A;   ANTERIOR LUMBAR FUSION   01/18/2014   AV FISTULA PLACEMENT Left 04/20/2019   Procedure: ARTERIOVENOUS (AV) FISTULA CREATION;  Surgeon: [name], MD;  Location: [place];  Service: Vascular;  Laterality: Left;   BACK SURGERY     BREAST LUMPECTOMY Left 01/2003   BREAST LUMPECTOMY Right 2014   BREAST LUMPECTOMY WITH NEEDLE LOCALIZATION AND AXILLARY SENTINEL LYMPH NODE BX Right 10/13/2012   Procedure: BREAST LUMPECTOMY WITH NEEDLE LOCALIZATION;  Surgeon: [name], MD;  Location: [place];  Service: General;  Laterality: Right;  Right breast wire localized lumpectomy   INSERTION OF DIALYSIS CATHETER Right 04/20/2019   Procedure: INSERTION OF DIALYSIS CATHETER, right internal jugular;  Surgeon: [name], MD;  Location: [place];  Service: Vascular;  Laterality: Right;   INSERTION OF DIALYSIS CATHETER Right 09/24/2020   Procedure: INSERTION OF TUNNELED DIALYSIS CATHETER;  Surgeon: Rosetta Posner, MD;  Location: [place];  Service: Vascular;  Laterality: Right;   IR FLUORO GUIDE CV LINE RIGHT  09/22/2020   IR THORACENTESIS ASP PLEURAL SPACE W/IMG GUIDE  05/19/2019   IR US GUIDE VASC ACCESS LEFT  09/22/2020   IR US GUIDE VASC ACCESS RIGHT  09/22/2020   IR VENOCAVAGRAM SVC  09/22/2020   LAMINECTOMY  05/27/2009   Lumbar decompressive laminectomy, fusion and plating for lumbar spinal stensosis   LIGATION OF ARTERIOVENOUS  FISTULA Left 09/24/2020   Procedure: LIGATION OF LEFT ARM ARTERIOVENOUS  FISTULA;  Surgeon: Rosetta Posner, MD;  Location: [place];  Service: Vascular;  Laterality: Left;   LUMBAR LAMINECTOMY/DECOMPRESSION MICRODISCECTOMY Left 03/23/2013   Procedure: LUMBAR LAMINECTOMY/DECOMPRESSION MICRODISCECTOMY 1 LEVEL;  Surgeon: Eustace Moore, MD;  [facility];  Service: Neurosurgery;  Laterality: Left;  LUMBAR LAMINECTOMY/DECOMPRESSION MICRODISCECTOMY 1 LEVEL   MASTECTOMY, PARTIAL Left 02/26/2003   ; S/P re-excision of cranial and lateral margins 04/19/2003.    RE-EXCISION OF  BREAST CANCER,SUPERIOR MARGINS Right 10/27/2012   Procedure: RE-EXCISION OF BREAST CANCER,SUPERIOR and inferior MARGINS;  Surgeon: [name], MD;  Location: [place];  Service: General;  Laterality: Right;   RE-EXCISION OF BREAST LUMPECTOMY Left 04/2003   TEE WITHOUT CARDIOVERSION N/A 04/04/2019   Procedure: Transesophageal Echocardiogram (Tee);  Surgeon: Wonda Olds, MD;  Location: [place];  Service: Open Heart Surgery;  Laterality: N/A;   THORACIC AORTIC ANEURYSM REPAIR N/A 04/04/2019   Procedure: THORACIC ASCENDING ANEURYSM REPAIR (AAA)  USING 28 MM X 30 CM HEMASHIELD PLATINUM VASCULAR GRAFT;  Surgeon: Wonda Olds, MD;  [facility];  Service: Open Heart Surgery;  Laterality: N/A;    Family History  Problem Relation Age of Onset   Colon cancer Mother 86   Hypertension Mother    Diabetes Sister 53   Hypertension Sister    Diabetes Brother    Hypertension Brother    Diabetes Brother    Hypertension Brother    Kidney disease Son  s/p renal transplant   Hypertension Son    Diabetes Son    Multiple sclerosis Son    Bone cancer Sister 87   Breast cancer Neg Hx    Cervical cancer Neg Hx     Social History   Socioeconomic History   Marital status: Single    Spouse name: Not on file   Number of children: 2   Years of education: Not on file   Highest education level: Not on file  Occupational History   Occupation: disabled  Tobacco Use   Smoking status: Every Day    Packs/day: 0.25    Years: 44.00    Total pack years: 11.00    Types: Cigarettes    Last attempt to quit: 05/24/2019    Years since quitting: 2.8   Smokeless tobacco: Never   Tobacco comments:    smoking less  Vaping Use   Vaping Use: Never used  Substance and Sexual Activity   Alcohol use: Not Currently    Alcohol/week: 2.0 standard drinks of alcohol    Types: 2 Cans of beer per week    Comment: "couple beers a weekend", not currently   Drug use: Not Currently    Types: Cocaine     Comment: last in 2005   Sexual activity: Yes    Birth control/protection: Post-menopausal  Other Topics Concern   Not on file  Social History Narrative   Not on file   Social Determinants of Health   Financial Resource Strain: Not on file  Food Insecurity: No Food Insecurity (03/12/2022)   Hunger Vital Sign    Worried About Running Out of Food in the Last Year: Never true    Ran Out of Food in the Last Year: Never true  Transportation Needs: No Transportation Needs (03/12/2022)   PRAPARE - Hydrologist (Medical): No    Lack of Transportation (Non-Medical): No  Physical Activity: Not on file  Stress: Not on file  Social Connections: Not on file  Intimate Partner Violence: Not on file    Review of Systems  Neurological:  Positive for speech change and focal weakness.  All other systems reviewed and are negative.       Objective    BP (!) 163/94   Pulse 87   Temp 98.1 F (36.7 C) (Oral)   Resp 16   Wt 107 lb (48.5 kg)   SpO2 96%   BMI 18.95 kg/m   Physical Exam Vitals and nursing note reviewed.  Constitutional:      General: She is not in acute distress. Cardiovascular:     Rate and Rhythm: Normal rate and regular rhythm.  Pulmonary:     Effort: Pulmonary effort is normal.     Breath sounds: Normal breath sounds.  Abdominal:     Palpations: Abdomen is soft.     Tenderness: There is no abdominal tenderness.  Neurological:     General: No focal deficit present.     Mental Status: She is alert and oriented to person, place, and time.     Motor: Weakness present.     Gait: Gait abnormal.  Psychiatric:        Mood and Affect: Mood normal.        Behavior: Behavior normal.         Assessment & Plan:   1. Essential hypertension Elevated readings since leaving hospital. Will restart hydralazine with present management and monitor  2. Pure hypercholesterolemia continue  3. H/O:  stroke Improving since discharge.   4.  Anxiety and depression Remeron dc'd in hospital - monitor and consider restarting.   5. ESRD (end stage renal disease) ([place]) Management per consultant  6. Encounter to establish care     Return in about 3 weeks (around 04/22/2022).   Becky Sax, MD

## 2022-04-03 ENCOUNTER — Telehealth: Payer: Self-pay | Admitting: Family Medicine

## 2022-04-03 NOTE — Telephone Encounter (Signed)
Pt needs a shower chair and adjustable shower head with hose/nozzle so she can bring the water closer to her during baths. She needs the chair and not the stool because she does not want to fall.   Medium sized, pt is petite.   Pt's sister [phone] Blake Divine)

## 2022-04-06 ENCOUNTER — Encounter: Payer: Self-pay | Admitting: Physical Medicine and Rehabilitation

## 2022-04-06 ENCOUNTER — Encounter
Payer: Medicare Other | Attending: Physical Medicine and Rehabilitation | Admitting: Physical Medicine and Rehabilitation

## 2022-04-06 VITALS — BP 183/91 | HR 84 | Ht 63.0 in | Wt 108.6 lb

## 2022-04-06 DIAGNOSIS — I63412 Cerebral infarction due to embolism of left middle cerebral artery: Secondary | ICD-10-CM | POA: Diagnosis not present

## 2022-04-06 DIAGNOSIS — N186 End stage renal disease: Secondary | ICD-10-CM | POA: Diagnosis present

## 2022-04-06 DIAGNOSIS — G8191 Hemiplegia, unspecified affecting right dominant side: Secondary | ICD-10-CM

## 2022-04-06 DIAGNOSIS — I10 Essential (primary) hypertension: Secondary | ICD-10-CM | POA: Diagnosis not present

## 2022-04-06 DIAGNOSIS — I69351 Hemiplegia and hemiparesis following cerebral infarction affecting right dominant side: Secondary | ICD-10-CM | POA: Insufficient documentation

## 2022-04-06 DIAGNOSIS — Z992 Dependence on renal dialysis: Secondary | ICD-10-CM | POA: Diagnosis present

## 2022-04-06 NOTE — Assessment & Plan Note (Signed)
Referral sent to dietician to assist family in meal planning for renal/low phosphate diet

## 2022-04-06 NOTE — Patient Instructions (Addendum)
I have referred you to a dietician for meal planning with low phosphate/renal diet.  I highly recommend calling your PCP Dr. Dorna Mai to discuss your hypertension and further medication management, given persistent blood pressures >408 systolic despite recent medication adjustments. If you experience any symptoms concerning for recurrent stroke, please seek immediate attention in the ER.   I will reach out to your OT, Benito Mccreedy, regarding trying a compression glove for edema in your right hand. If this worsens, or if the hand becomes cool or darkly discolored, call our office immediately or seek emergency care for an ultrasound to evaluate for a blood clot.   I will look into possible repair services for your chair lift. Your family doctor has already received a request to order you a shower chair; if there are issues obtaining this, you can request a script be sent to our office as well.  Follow up in 3 months to review your progress with therapies and any functional needs moving forward.

## 2022-04-06 NOTE — Progress Notes (Signed)
Subjective:    Patient ID: Monique Henderson, female    DOB: [birth year], 71 y.o.   MRN: [id number]  HPI  Monique Henderson is a 71 y.o. female with PMHx ESRD on HD TTS, PAF on Eliquis, aortic dissection status postrepair, HFrEF, and refractory HTN who presents to PM&R clinic as a transition of care after IPR admission 9/28-10/8 for R hemiparesis s/p cardioembolic stroke. She is seen today with her sister.   Briefly, she presented to to the emergency department on 02/22/2022 with facial droop, slurred speech and right-sided weakness.  MRI of the brain revealed acute/subacute cortical infarct of the left posterior frontal and anterior parietal lobes.  She was admitted to rehab 02/26/2022 for PT, ST and OT. Rehab course c/b recurrent chest pain (VSS. No distress. Multiple cardiac workup unrevealing. Reported bilateral lower rib chest wall pain when being taken off the HD machine),  hypercalcemia and hyperphosphatemia (managed by nephrology w/ Milly Jakob and Renvela), pruritus (likely HD related), HTN (stable 150-160s on amlodipine, carvedilol, clonidine and isosorbide with some variance with dialysis) .    Of note, she was discharged from OP PM&R clinic 11/17/2010 by Dr. Lucia Estelle for medication noncompliance.  Interval Hx:   - Therapies: Patient has been working with PT, OT, and SLP. She is working with SLP on her speech intelligability and PT on walking. Her sister states she just started on November 1st.   - Follow ups: scheduled, neurology in December. Just established with Dorna Mai, MD for PCP and has a follow up pending. She did not get a nephrologist on discharge.    - Falls: no falls, walking without DME   - DME: Sister states she cannot take a bath with her current setup and she needs a shower chair/tub bench to help. She states she has a stair lift installed about 5 years ago (Lives in a housing authority), but the chair is on the right hand side for support and the handrail is on the  left; she cannot safely use it in current set up.    - Medications: Sister just started a weekly pill organizer; she is concerned about noncompliance, but this seems to have greatly improved since implementing the pill organizer. Just restarted Hydralazine 25 mg TID with PCP; SBP has intermittently run 180-200s since then. Has a pain doctor who prescribes her percocet (PDMP reviewed). No medication needs at this time.    - Other concerns:  Her boyfriend was helping her with ADLs, cooking, cleaning. Unfortunately he is no longer in the picture; sisters are helping her now with ADLs. Sister says the renal failure has made it very difficult to cook and meal prep for the patient, which family tries to do so patient does not have to worry about cooking.   - Noted worsening swelling in R hand since hospital discharge, along with some mild pain in R arm with mobilization.   - Occassional, intermittent numb/tingling sensations along R cheek, jaw.   Pain Inventory Average Pain 9 Pain Right Now 9 My pain is burning  LOCATION OF PAIN  right hand and fingers  BOWEL Number of stools per week: 2  BLADDER Dialysis T, TH, Sat  access is right subclavian   Mobility walk without assistance ability to climb steps?  yes do you drive?  no Do you have any goals in this area?  no  Function not employed: date last employed . disabled: date disabled . I need assistance with the following:  dressing, bathing, meal prep,  household duties, and shopping  Neuro/Psych weakness numbness confusion depression anxiety  Prior Studies Any changes since last visit?  no  Physicians involved in your care Any changes since last visit?  no  Saw PCP last week, added medication hydralazine 25 mg bid   Family History  Problem Relation Age of Onset   Colon cancer Mother 23   Hypertension Mother    Diabetes Sister 18   Hypertension Sister    Diabetes Brother    Hypertension Brother    Diabetes Brother     Hypertension Brother    Kidney disease Son        s/p renal transplant   Hypertension Son    Diabetes Son    Multiple sclerosis Son    Bone cancer Sister 17   Breast cancer Neg Hx    Cervical cancer Neg Hx    Social History   Socioeconomic History   Marital status: Single    Spouse name: Not on file   Number of children: 2   Years of education: Not on file   Highest education level: Not on file  Occupational History   Occupation: disabled  Tobacco Use   Smoking status: Every Day    Packs/day: 0.25    Years: 44.00    Total pack years: 11.00    Types: Cigarettes    Last attempt to quit: 05/24/2019    Years since quitting: 2.8   Smokeless tobacco: Never   Tobacco comments:    smoking less  Vaping Use   Vaping Use: Never used  Substance and Sexual Activity   Alcohol use: Not Currently    Alcohol/week: 2.0 standard drinks of alcohol    Types: 2 Cans of beer per week    Comment: "couple beers a weekend", not currently   Drug use: Not Currently    Types: Cocaine    Comment: last in 2005   Sexual activity: Yes    Birth control/protection: Post-menopausal  Other Topics Concern   Not on file  Social History Narrative   Not on file   Social Determinants of Health   Financial Resource Strain: Not on file  Food Insecurity: No Food Insecurity (03/12/2022)   Hunger Vital Sign    Worried About Running Out of Food in the Last Year: Never true    Ran Out of Food in the Last Year: Never true  Transportation Needs: No Transportation Needs (03/12/2022)   PRAPARE - Hydrologist (Medical): No    Lack of Transportation (Non-Medical): No  Physical Activity: Not on file  Stress: Not on file  Social Connections: Not on file   Past Surgical History:  Procedure Laterality Date   ANTERIOR LAT LUMBAR FUSION N/A 01/18/2014   Procedure: ANTERIOR LATERAL LUMBAR FUSION LUMBAR TWO-THREE;  Surgeon: Eustace Moore, MD;  Location: [facility];  Service:  Neurosurgery;  Laterality: N/A;   ANTERIOR LUMBAR FUSION  01/18/2014   AV FISTULA PLACEMENT Left 04/20/2019   Procedure: ARTERIOVENOUS (AV) FISTULA CREATION;  Surgeon: [name], MD;  Location: [facility];  Service: Vascular;  Laterality: Left;   BACK SURGERY     BREAST LUMPECTOMY Left 01/2003   BREAST LUMPECTOMY Right 2014   BREAST LUMPECTOMY WITH NEEDLE LOCALIZATION AND AXILLARY SENTINEL LYMPH NODE BX Right 10/13/2012   Procedure: BREAST LUMPECTOMY WITH NEEDLE LOCALIZATION;  Surgeon: [name], MD;  Location: [facility];  Service: General;  Laterality: Right;  Right breast wire localized lumpectomy  INSERTION OF DIALYSIS CATHETER Right 04/20/2019   Procedure: INSERTION OF DIALYSIS CATHETER, right internal jugular;  Surgeon: [name], MD;  Location: [facility];  Service: Vascular;  Laterality: Right;   INSERTION OF DIALYSIS CATHETER Right 09/24/2020   Procedure: INSERTION OF TUNNELED DIALYSIS CATHETER;  Surgeon: Rosetta Posner, MD;  Location: [facility];  Service: Vascular;  Laterality: Right;   IR FLUORO GUIDE CV LINE RIGHT  09/22/2020   IR THORACENTESIS ASP PLEURAL SPACE W/IMG GUIDE  05/19/2019   IR US GUIDE VASC ACCESS LEFT  09/22/2020   IR US GUIDE VASC ACCESS RIGHT  09/22/2020   IR VENOCAVAGRAM SVC  09/22/2020   LAMINECTOMY  05/27/2009   Lumbar decompressive laminectomy, fusion and plating for lumbar spinal stensosis   LIGATION OF ARTERIOVENOUS  FISTULA Left 09/24/2020   Procedure: LIGATION OF LEFT ARM ARTERIOVENOUS  FISTULA;  Surgeon: Rosetta Posner, MD;  Location: [facility];  Service: Vascular;  Laterality: Left;   LUMBAR LAMINECTOMY/DECOMPRESSION MICRODISCECTOMY Left 03/23/2013   Procedure: LUMBAR LAMINECTOMY/DECOMPRESSION MICRODISCECTOMY 1 LEVEL;  Surgeon: Eustace Moore, MD;  Location: [facility];  Service: Neurosurgery;  Laterality: Left;  LUMBAR LAMINECTOMY/DECOMPRESSION MICRODISCECTOMY 1 LEVEL   MASTECTOMY, PARTIAL Left 02/26/2003   ; S/P re-excision of  cranial and lateral margins 04/19/2003.    RE-EXCISION OF BREAST CANCER,SUPERIOR MARGINS Right 10/27/2012   Procedure: RE-EXCISION OF BREAST CANCER,SUPERIOR and inferior MARGINS;  Surgeon: [name], MD;  Location: [facility];  Service: General;  Laterality: Right;   RE-EXCISION OF BREAST LUMPECTOMY Left 04/2003   TEE WITHOUT CARDIOVERSION N/A 04/04/2019   Procedure: Transesophageal Echocardiogram (Tee);  Surgeon: Wonda Olds, MD;  Location: [facility];  Service: Open Heart Surgery;  Laterality: N/A;   THORACIC AORTIC ANEURYSM REPAIR N/A 04/04/2019   Procedure: THORACIC ASCENDING ANEURYSM REPAIR (AAA)  USING 28 MM X 30 CM HEMASHIELD PLATINUM VASCULAR GRAFT;  Surgeon: Wonda Olds, MD;  Location[facility];  Service: Open Heart Surgery;  Laterality: N/A;   Past Medical History:  Diagnosis Date   AF (paroxysmal atrial fibrillation) ([place]) 05/29/2019   on Coumadin   Aortic atherosclerosis ([place]) 07/05/2019   Aortic dissection ([place]) 04/04/2019   s/p repair   Bone spur 2008   Right calcaneal foot spur   Breast cancer ([place]) 2004   Ductal carcinoma in situ of the left breast; S/P left partial mastectomy 02/26/2003; S/P re-excision of cranial and lateral margins11/18/2004.radiation   Cerebral thrombosis with cerebral infarction 05/22/2019   Chronic low back pain 06/22/2016   Chronic obstructive lung disease ([place]) 01/16/2017   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 Autumn Messing; S/P re-excision of superior and inferior margins 10/27/2012.    ESRD on hemodialysis ([place]) 05/29/2019   Essential hypertension 09/16/2006   GERD 09/16/2006   Hepatitis C    treated and RNA confirmed not detectable 01/2017   Insomnia 03/14/2015   Malnutrition of moderate degree 05/19/2019   Non compliance with medical treatment 12/04/2017   Normocytic anemia    With thrombocytosis   Osteoarthritis    Right ureteral stone 2002   S/P lumbar spinal fusion  01/18/2014   S/P lumbar decompressive laminectomy, fusion, and plating for lumbar spinal stenosis on 05/27/2009 by Dr. Eustace Moore.  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  Eustace Moore.     Tobacco use disorder 04/19/2009   Uterine fibroid    Wears dentures    top   BP (!) 190/99   Pulse 84   Ht '5\' 3"'$  (1.6 m)   Wt 108 lb 9.6 oz (49.3 kg)   SpO2 94%   BMI 19.24 kg/m   Opioid Risk Score:   Fall Risk Score:  `1  Depression screen [facility] 2/9     04/06/2022   10:32 AM 02/14/2021    8:02 AM 05/13/2020   10:01 AM 11/30/2019    3:28 PM 11/02/2019    3:35 PM 10/03/2019    4:17 PM 07/31/2019    2:12 PM  Depression screen PHQ 2/9  Decreased Interest 0 3 0 0 0 0 0  Down, Depressed, Hopeless 1 2 0 0 0 0 0  PHQ - 2 Score 1 5 0 0 0 0 0  Altered sleeping 1 3       Tired, decreased energy 1 3       Change in appetite 0 3       Feeling bad or failure about yourself  0 3       Trouble concentrating 1 2       Moving slowly or fidgety/restless 0 2       Suicidal thoughts 0 1       PHQ-9 Score 4 22       Difficult doing work/chores  Extremely dIfficult          Review of Systems  Constitutional: Negative.   HENT: Negative.    Eyes: Negative.   Respiratory: Negative.    Cardiovascular:        Right arm swelling  Gastrointestinal: Negative.   Endocrine: Negative.   Genitourinary:        Dialysis T Th Sat  Musculoskeletal: Negative.   Skin:  Positive for rash.  Allergic/Immunologic: Negative.   Neurological:  Positive for weakness and numbness.  Hematological:  Bruises/bleeds easily.       Eliquis  Psychiatric/Behavioral:  Positive for confusion and dysphoric mood. The patient is nervous/anxious.   All other systems reviewed and are negative.      Objective:   Physical  Exam  Constitution: Appropriate appearance for age. No apparnet distress  HEENT: PERRL, EOMI grossly intact.  Resp: CTAB. No rales, rhonchi, or wheezing. Cardio: RRR. No mumurs, rubs, or gallops.  2+ R hand edema extending into distal wrist. Capillary refill brisk. 2+ radial and ulnar distal pulses. No discoloration. Abdomen: Nondistended. Nontender. +bowel sounds. Psych: Appropriate mood and affect. Neuro: Mild cognitive deficits.  +R facial droop., + R tongue deviation, + R shoulder weakness. Sensation to light touch V1-3 intact.  +Dysarthria. LUE and LLE 5/5 RUE 4/5 shoulder, 3/5 elbow flexion, 3/5 elbow extension, 0/5 wrist extension, finger abduction, or finger flexion RLE 4/5 hip flexion, 4/5 knee extension, 5/5 DF, 5/5 PF     Assessment & Plan:   CARLI LEFEVERS is a 71 y.o. female with PMHx ESRD on HD TTS, PAF on Eliquis, aortic dissection status postrepair, HFrEF, and refractory HTN who presents to PM&R clinic as a transition of care after IPR admission 9/28-10/8 for R hemiparesis s/p cardioembolic stroke. She is seen today with her sister.   ESRD on dialysis [facility]) Assessment & Plan: Referral sent to dietician to assist family in meal planning for renal/low phosphate diet  Orders: -     Amb ref to Medical Nutrition Therapy-MNT  Cerebrovascular accident (CVA)  due to embolism of left middle cerebral artery (HCC)  Malignant hypertension Assessment & Plan: I highly recommend calling your PCP Dr. Dorna Mai to discuss your hypertension and further medication management, given persistent blood pressures >416 systolic despite recent medication adjustments. If you experience any symptoms concerning for recurrent stroke, please seek immediate attention in the ER.    Right hemiparesis [facility]) Assessment & Plan: I will reach out to your OT, Benito Mccreedy, regarding trying a compression glove for edema in your right hand. If this worsens, or if the hand becomes cool or darkly  discolored, call our office immediately or seek emergency care for an ultrasound to evaluate for a blood clot.   I will look into possible repair services for your chair lift. Your family doctor has already received a request to order you a shower chair; if there are issues obtaining this, you can request a script be sent to our office as well.  Follow up in 3 months to review your progress with therapies and any functional needs moving forward.       Gertie Gowda, DO 04/06/2022

## 2022-04-06 NOTE — Assessment & Plan Note (Signed)
I will reach out to your OT, Benito Mccreedy, regarding trying a compression glove for edema in your right hand. If this worsens, or if the hand becomes cool or darkly discolored, call our office immediately or seek emergency care for an ultrasound to evaluate for a blood clot.   I will look into possible repair services for your chair lift. Your family doctor has already received a request to order you a shower chair; if there are issues obtaining this, you can request a script be sent to our office as well.  Follow up in 3 months to review your progress with therapies and any functional needs moving forward.

## 2022-04-06 NOTE — Assessment & Plan Note (Signed)
I highly recommend calling your PCP Dr. Dorna Mai to discuss your hypertension and further medication management, given persistent blood pressures >207 systolic despite recent medication adjustments. If you experience any symptoms concerning for recurrent stroke, please seek immediate attention in the ER.

## 2022-04-07 NOTE — Therapy (Signed)
OUTPATIENT OCCUPATIONAL THERAPY TREATMENT NOTE  Patient Name: TAKIMA ENCINA MRN: [id number] DOB:11/05/1950, 71 y.o., female Today's Date: 04/08/2022  PCP: N/A REFERRING PROVIDER: Bary Leriche, PA-C   OT End of Session - 04/08/22 1508     Visit Number 2    Number of Visits 12    Date for OT Re-Evaluation 05/15/22    Authorization Type [employer] Medicare    Progress Note Due on Visit 10    OT Start Time 1510    OT Stop Time 1557    OT Time Calculation (min) 47 min    Equipment Utilized During Treatment compressive tubular bandages.    Activity Tolerance Patient tolerated treatment well;No increased pain;Patient limited by pain;Patient limited by fatigue    Behavior During Therapy [facility] for tasks assessed/performed             Past Medical History:  Diagnosis Date   AF (paroxysmal atrial fibrillation) ([place]) 05/29/2019   on Coumadin   Aortic atherosclerosis ([place]) 07/05/2019   Aortic dissection ([place]) 04/04/2019   s/p repair   Bone spur 2008   Right calcaneal foot spur   Breast cancer ([place]) 2004   Ductal carcinoma in situ of the left breast; S/P left partial mastectomy 02/26/2003; S/P re-excision of cranial and lateral margins11/18/2004.radiation   Cerebral thrombosis with cerebral infarction 05/22/2019   Chronic low back pain 06/22/2016   Chronic obstructive lung disease ([place]) 01/16/2017   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 Autumn Messing; S/P re-excision of superior and inferior margins 10/27/2012.    ESRD on hemodialysis ([place]) 05/29/2019   Essential hypertension 09/16/2006   GERD 09/16/2006   Hepatitis C    treated and RNA confirmed not detectable 01/2017   Insomnia 03/14/2015   Malnutrition of moderate degree 05/19/2019   Non compliance with medical treatment 12/04/2017   Normocytic anemia    With thrombocytosis   Osteoarthritis    Right ureteral stone 2002   S/P lumbar spinal fusion 01/18/2014   S/P  lumbar decompressive laminectomy, fusion, and plating for lumbar spinal stenosis on 05/27/2009 by Dr. Eustace .  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 Eustace .     Tobacco use disorder 04/19/2009   Uterine fibroid    Wears dentures    top   Past Surgical History:  Procedure Laterality Date   ANTERIOR LAT LUMBAR FUSION N/A 01/18/2014   Procedure: ANTERIOR LATERAL LUMBAR FUSION LUMBAR TWO-THREE;  Surgeon: Eustace , MD;  Location: [place] [facility];  Service: Neurosurgery;  Laterality: N/A;   ANTERIOR LUMBAR FUSION  01/18/2014   AV FISTULA PLACEMENT Left 04/20/2019   Procedure: ARTERIOVENOUS (AV) FISTULA CREATION;  Surgeon: [name], MD;  Location: [place];  Service: Vascular;  Laterality: Left;   BACK SURGERY     BREAST LUMPECTOMY Left 01/2003   BREAST LUMPECTOMY Right 2014   BREAST LUMPECTOMY WITH NEEDLE LOCALIZATION AND AXILLARY SENTINEL LYMPH NODE BX Right 10/13/2012   Procedure: BREAST LUMPECTOMY WITH NEEDLE LOCALIZATION;  Surgeon: [name], MD;  Location: [facility];  Service: General;  Laterality: Right;  Right breast wire localized lumpectomy   INSERTION OF DIALYSIS CATHETER Right 04/20/2019   Procedure: INSERTION OF DIALYSIS CATHETER, right internal jugular;  Surgeon: [name], MD;  [facility];  Service: Vascular;  Laterality: Right;   INSERTION OF DIALYSIS CATHETER Right 09/24/2020   Procedure: INSERTION OF TUNNELED DIALYSIS CATHETER;  Surgeon: Rosetta Posner, MD;  Location: [facility];  Service: Vascular;  Laterality: Right;   IR FLUORO GUIDE CV LINE RIGHT  09/22/2020   IR THORACENTESIS ASP PLEURAL SPACE W/IMG GUIDE  05/19/2019   IR US GUIDE VASC ACCESS LEFT  09/22/2020   IR US GUIDE VASC ACCESS RIGHT  09/22/2020   IR VENOCAVAGRAM  SVC  09/22/2020   LAMINECTOMY  05/27/2009   Lumbar decompressive laminectomy, fusion and plating for lumbar spinal stensosis   LIGATION OF ARTERIOVENOUS  FISTULA Left 09/24/2020   Procedure: LIGATION OF LEFT ARM ARTERIOVENOUS  FISTULA;  Surgeon: Rosetta Posner, MD;  Location: [place];  Service: Vascular;  Laterality: Left;   LUMBAR LAMINECTOMY/DECOMPRESSION MICRODISCECTOMY Left 03/23/2013   Procedure: LUMBAR LAMINECTOMY/DECOMPRESSION MICRODISCECTOMY 1 LEVEL;  Surgeon: Eustace , MD;  Location: [place];  Service: Neurosurgery;  Laterality: Left;  LUMBAR LAMINECTOMY/DECOMPRESSION MICRODISCECTOMY 1 LEVEL   MASTECTOMY, PARTIAL Left 02/26/2003   ; S/P re-excision of cranial and lateral margins 04/19/2003.    RE-EXCISION OF BREAST CANCER,SUPERIOR MARGINS Right 10/27/2012   Procedure: RE-EXCISION OF BREAST CANCER,SUPERIOR and inferior MARGINS;  Surgeon: [name], MD;  Location: [facility];  Service: General;  Laterality: Right;   RE-EXCISION OF BREAST LUMPECTOMY Left 04/2003   TEE WITHOUT CARDIOVERSION N/A 04/04/2019   Procedure: Transesophageal Echocardiogram ([place]);  Surgeon: Wonda Olds, MD;  Location: [facility];  Service: Open Heart Surgery;  Laterality: N/A;   THORACIC AORTIC ANEURYSM REPAIR N/A 04/04/2019   Procedure: THORACIC ASCENDING ANEURYSM REPAIR (AAA)  USING 28 MM X 30 CM HEMASHIELD PLATINUM VASCULAR GRAFT;  Surgeon: Wonda Olds, MD;  [facility];  Service: Open Heart Surgery;  Laterality: N/A;   Patient Active Problem List   Diagnosis Date Noted   Right hemiparesis ([place]) 04/06/2022   Acute cardioembolic stroke ([place]) 76/72/0947   Stroke (cerebrum) ([place]) 02/23/2022   Stroke ([place]) 02/23/2022   Hypervolemia associated with renal insufficiency 01/29/2022   Dyslipidemia 01/29/2022   DNR (do not resuscitate) 01/29/2022   Polysubstance abuse ([place]) 01/06/2022   Acute on chronic combined systolic and diastolic CHF (congestive heart failure) ([place]) 01/06/2022   Abdominal pain  09/62/8366   Acute metabolic encephalopathy 29/47/6546   Malignant HTN with heart disease, w/o CHF, 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 12/24/2021   History of hepatitis C 12/08/2021   Hypertensive urgency 10/29/2021   Fluid overload 10/29/2021   Acute on chronic HFrEF (heart failure with reduced ejection fraction) ([place]) 10/29/2021   Chronic anticoagulation 10/29/2021   Prolonged QT interval 10/29/2021   Leukopenia 10/29/2021   SOB (shortness of breath) 09/15/2021   Hypertensive emergency 08/23/2021   Altered mental status    ESRD (end stage renal disease) on dialysis with hyperkalemia and metabolic acidosis with elevated AG  08/12/2021   Malignant hypertension 06/19/2021   Anemia 06/05/2021   Pure hypercholesterolemia 02/14/2021   Acute pulmonary edema ([place]) 12/23/2020   Acute respiratory failure with hypoxia ([place]) 09/21/2020   Acute renal failure superimposed on chronic kidney disease ([place]) 09/21/2020   Community acquired pneumonia 09/21/2020   Uremia 50/35/4656   Metabolic acidosis with increased anion gap and accumulation of organic acids 09/21/2020   Hypertensive crisis 09/21/2020   Troponin level elevated 08/28/2019   Positive D dimer 08/28/2019   Hyperkalemia 08/28/2019   SVT (supraventricular tachycardia) 08/28/2019   SIRS (systemic inflammatory  response syndrome) ([place]) 08/27/2019   Paroxysmal SVT (supraventricular tachycardia) 08/27/2019   Aortic atherosclerosis ([place]) 07/05/2019   Chest pain 07/05/2019   History of CVA (cerebrovascular accident) 05/29/2019   AF (paroxysmal atrial fibrillation) ([place]) 05/29/2019   ESRD on dialysis ([place]) 05/29/2019   Cerebral thrombosis with cerebral infarction 05/22/2019   Malnutrition of moderate degree 05/19/2019   Pleural effusion 05/18/2019   Atrial fibrillation with RVR ([place]) 05/18/2019   S/P aortic aneurysm repair 04/07/2019   Aortic dissection ([place]) 04/04/2019   Dissection of aorta ([place]) 04/03/2019   Non compliance with medical  treatment 12/04/2017   Chronic obstructive lung disease ([place]) 01/16/2017   Chronic low back pain 06/22/2016   Insomnia 03/14/2015   S/P lumbar spinal fusion 01/18/2014   Tobacco use disorder 04/19/2009   GERD 09/16/2006    ONSET DATE:  DOI: 02/22/22 (CVA)  REFERRING DIAG: I63.9 (ICD-10-CM) - Cerebral infarction, unspecified  THERAPY DIAG:  Muscle weakness (generalized)  Localized edema  Other lack of coordination  Stiffness of right wrist, not elsewhere classified  Stiffness of right hand, not elsewhere classified  Acute cardioembolic stroke [facility])  Rationale for Evaluation and Treatment: Rehabilitation  PERTINENT HISTORY: She suffered a stroke on 02/22/22 to her Lt posterior-frontal parietal and anterior-parietal areas causing dysphagia, right hemiplegia, speech issues, and cognitive problems. She spent ~2 weeks in the hospital, in inpatient rehab. At discharge from inpatient rehab, she was noted to have: no right handed grip, 75% intelligible speech, SBA with no device for transfers, dysphagia, HTN, HLD, tachycardia, A-fib, CKD, and chest pain (other than cardiac).   PRECAUTIONS: Fall and Other: Rt hemiplegia, speech, swallow and cognitive issues  WEIGHT BEARING RESTRICTIONS: No   SUBJECTIVE:   SUBJECTIVE STATEMENT: She states her hand swelled up and now hurts- none of that was going on last week at eval.  PAIN:  Are you having pain? Yes Rating: 8/10 at rest now in Right arm from wrist to elbow and hand is very swollen   PATIENT GOALS: To get right dom arm moving better.    OBJECTIVE: (All objective assessments below are from initial evaluation on: 04/01/22 unless otherwise specified.)    HAND DOMINANCE: Right   ADLs: Overall ADLs: States decreased ability to grab, hold household objects, pain and inability to open containers, perform FMS tasks (manipulate fasteners on clothing), mild to moderate bathing problems as well.    FUNCTIONAL OUTCOME MEASURES: Eval:  Quck DASH 38.6% impairment today  (Higher % Score  =  More Impairment)     UPPER EXTREMITY ROM     Shoulder to Wrist AROM Right eval Rt  04/08/22  Shoulder flexion 108* in scaption 118* in scaption  Shoulder abduction 128* 107*  Shoulder extension 68   Shoulder internal rotation    Shoulder external rotation    Elbow flexion 130 138*  Elbow extension (-10) 0*  Forearm supination 65 51*  Forearm pronation  65 60*  Wrist flexion 15   Wrist extension 4   Wrist ulnar deviation 19   Wrist radial deviation 5   (Blank rows = not tested)  Eval: She has some (~10-15*) total motion in composite finger flexion and extension today with prolonged squeezes. Thumb motion is nil.   Hand AROM Right eval  Full Fist Ability (or Gap to Distal Palmar Crease) 5.5cm gap from MF tip to [facility]  Thumb Opposition to Small Finger (or Gap) unable  Thumb Opposition to Base of Small Finger (or Gap)  unable  (Blank rows = not tested)  UPPER EXTREMITY MMT:    Eval:  Right Arm: Grossly 3+/5 at shoulder, 3+/5 at elbow, 3-/5 at forearm, 2-/5 at wrist and 1+/5 in fingers  MMT Right TBD  Shoulder flexion   Shoulder abduction   Shoulder adduction   Shoulder extension   Shoulder internal rotation   Shoulder external rotation   Middle trapezius   Lower trapezius   Elbow flexion   Elbow extension   Forearm supination   Forearm pronation   Wrist flexion   Wrist extension   Wrist ulnar deviation   Wrist radial deviation   (Blank rows = not tested)  HAND FUNCTION: Eval: Observed weakness in affected hand.  Grip strength Right: 0 lbs, Left: 26 lbs   COORDINATION: Eval: Observed coordination impairments with affected rt hand. Box and Blocks Test: Right unable to grasp a block today (TBD is [facility])  SENSATION: Eval:  Light touch intact today, equal b/l per pt  EDEMA:   04/08/22: 48.3cm figure of 8 around Rt hand to start (after manual techniques went down to about 45cm)   Eval:  Moderately swollen in  Rt hand, 41.5cm figure of 8 around Rt hand (compared to 38 Lt hand)   COGNITION: Eval: Overall cognitive status: overtly seemed [facility] for evaluation today, though perhaps a bit impulsive.  She is working with SLP for this and speech  OBSERVATIONS:   Eval: She has fairly good motion at shoulder and elbow, speech can be difficult to understand   TODAY'S TREATMENT:  04/08/22: Due to significant increase in pain and swelling OT has her perform active range of motion exam which is largely unchanged.  That is a good sign.  Her hand is significantly stiff due to increased swelling however.  OT reviews edema reduction techniques including retrograde massage wearing compression garments (OT supplies) as well as the importance of keeping hand elevated above heart and keeping it moving (self-care).  OT also gives out a printed, comprehensive HEP packet which she performs back to OT as listed below.  Minimal discomfort during.  OT also recommends an edema glove for her measures her as a size medium) about the appropriate recommendation.  After manual techniques and edema reduction techniques her edema does decrease by about 3 cm today.  Again is a great sign that she is able to help control it if she follows these directions.  Her family was educated in the lobby to encourage her to be doing these things at least 4 or more times a day.  Exercises - Reach arms upward   - 4 x daily - 10 reps - Standing Elbow Flexion Extension AROM  - 4 x daily - 10 reps - Turn [employer] Facing Up & Down  - 4 x daily - 10 reps - Bend and Pull Back Wrist SLOWLY  - 4 x daily - 10 reps - "Windshield Wipers"   - 4 x daily - 10 reps - Tendon Glides  - 4 x daily - 10 reps - Seated Single Finger Extension  - 4 x daily - 10 reps - Thumb Opposition  - 4 x daily - 10 reps   PATIENT EDUCATION: Education details: See tx section above for details  Person educated: Patient Education method: Verbal Instruction, Teach back, Handouts  Education  comprehension: States and demonstrates understanding, Additional Education required    HOME EXERCISE PROGRAM: Access Code: [email or web address] URL: [email or web address] Date: 04/08/2022 Prepared by: Benito Mccreedy   GOALS: Goals reviewed with patient? Yes  SHORT TERM GOALS: Target date: 03/17/22  Pt will demo/state understanding of initial home exercise program (HEP) to improve function, pain, and independence.   Baseline: Needs a plan for rehabilitation  Goal status: INITIAL  2.  Pt will obtain protective, custom or mobilizing orthotic for safety and to improve function.  Baseline: Needs orthotic support  Goal status: TBD (This may be necessary to protect hand, if motion does not improve.)    LONG TERM GOALS: Target date: 05/15/22  Pt will improve functional ability by decreased impairment per Quick DASH assessment to 15% or better, for better quality of life. Baseline: 38.6% Goal status: INITIAL  2.  Pt will improve A/ROM in Rt wrist flex/ext to at least 40* each, to have prerequisite/functional motion for tasks like reach and grasp.  Baseline: 15* / 4* respectively  Goal status: INITIAL  3.  Pt will improve strength in Rt elbow to at least 4+/5 MMT to have increased functional ability to carry out selfcare and higher-level homecare tasks with no difficulty Baseline: 3+/5 MMT Goal status: INITIAL  4.  Pt will improve coordination skills in Rt hand, as seen by increasing score on box and blocks testing to at least 20 blocks to have increased functional ability to carry out fine motor tasks (fasteners, etc.) and more complex, coordinated IADLs (meal prep, sports, etc.).  Baseline: unable to grab a block Goal status: INITIAL  5.   Pt will improve grip strength in effected hand to at least 15# lbs for functional use at home and in IADLs. Baseline: 0 lbs Goal status: INITIAL   ASSESSMENT:  CLINICAL IMPRESSION: 04/08/22: It is concerning that she has such an increase  in pain and swelling after only 1 week.  OT was initially concern for a fall or injury, twisted wrist, broken hand, due to the severity of the swelling and pain now.  However OT checked her stability and wrist and fingers and there was no exquisite pain.  OT will monitor the swelling, but encourages her that if pain and swelling gets worse to either go to the emergency department or follow-up for an x-ray with advanced medical practitioner.  OT also asks her to be aware of her medications in case something there is affecting this.  She does receive dialysis and perhaps this is the cause of the additional swelling as well.  OT will monitor.  Also of note she was very lethargic today, eyes were closing at times.  She states being able to sleep at night however.   Eval: Patient is a 71 y.o. female who was seen today for occupational therapy evaluation for weakness and decreased functional ability after stroke. She will benefit from OP OT to improve quality of life.    PLAN:  OT FREQUENCY: 2x/week  OT DURATION: 6 weeks  PLANNED INTERVENTIONS: self care/ADL training, therapeutic exercise, therapeutic activity, neuromuscular re-education, manual therapy, passive range of motion, splinting, electrical stimulation, ultrasound, compression bandaging, moist heat, cryotherapy, contrast bath, patient/family education, coping strategies training, and DME and/or AE instructions  RECOMMENDED OTHER SERVICES: she is already receiving SLP and PT, perhaps would benefit from nutrition consult, if not already going, for CKD and stroke diet recommendations   CONSULTED AND AGREED WITH PLAN OF CARE: Patient and family member/caregiver  PLAN FOR NEXT SESSION:  Review edema management techniques review comprehensive HEP.  Add new stroke specific ideas such as weightbearing PNF patterns, developmental postures on plinth etc.   Benito Mccreedy, OTR/L, CHT 04/08/2022, 4:12 PM

## 2022-04-07 NOTE — Therapy (Signed)
OUTPATIENT PHYSICAL THERAPY NEURO TREATMENT   Patient Name: Monique Henderson MRN: [id number] DOB:1951/05/01, 71 y.o., female Today's Date: 04/08/2022   PCP: Dorna Mai REFERRING PROVIDER: Love   PT End of Session - 04/08/22 1615     Visit Number 2    Date for PT Re-Evaluation 07/01/22    Authorization Type [employer]    PT Start Time 1615    PT Stop Time 1656    PT Time Calculation (min) 41 min    Activity Tolerance Patient tolerated treatment well    Behavior During Therapy [facility] for tasks assessed/performed             Past Medical History:  Diagnosis Date   AF (paroxysmal atrial fibrillation) ([place]) 05/29/2019   on Coumadin   Aortic atherosclerosis ([place]) 07/05/2019   Aortic dissection ([place]) 04/04/2019   s/p repair   Bone spur 2008   Right calcaneal foot spur   Breast cancer ([place]) 2004   Ductal carcinoma in situ of the left breast; S/P left partial mastectomy 02/26/2003; S/P re-excision of cranial and lateral margins11/18/2004.radiation   Cerebral thrombosis with cerebral infarction 05/22/2019   Chronic low back pain 06/22/2016   Chronic obstructive lung disease ([place]) 01/16/2017   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 Autumn Messing; S/P re-excision of superior and inferior margins 10/27/2012.    ESRD on hemodialysis ([place]) 05/29/2019   Essential hypertension 09/16/2006   GERD 09/16/2006   Hepatitis C    treated and RNA confirmed not detectable 01/2017   Insomnia 03/14/2015   Malnutrition of moderate degree 05/19/2019   Non compliance with medical treatment 12/04/2017   Normocytic anemia    With thrombocytosis   Osteoarthritis    Right ureteral stone 2002   S/P lumbar spinal fusion 01/18/2014   S/P lumbar decompressive laminectomy, fusion, and plating for lumbar spinal stenosis on 05/27/2009 by Dr. Eustace Moore.  S/P anterolateral retroperitoneal interbody fusion L2-3 utilizing a 8 mm peek interbody cage  packed with morcellized allograft, and anterior lumbar plating L2-3 for recurrent disc herniation L2-3 with spinal stenosis on 01/18/2014 by Dr. Eustace Moore.     Tobacco use disorder 04/19/2009   Uterine fibroid    Wears dentures    top   Past Surgical History:  Procedure Laterality Date   ANTERIOR LAT LUMBAR FUSION N/A 01/18/2014   Procedure: ANTERIOR LATERAL LUMBAR FUSION LUMBAR TWO-THREE;  Surgeon: Eustace Moore, MD;  Location: [place] [facility];  Service: Neurosurgery;  Laterality: N/A;   ANTERIOR LUMBAR FUSION  01/18/2014   AV FISTULA PLACEMENT Left 04/20/2019   Procedure: ARTERIOVENOUS (AV) FISTULA CREATION;  Surgeon: [name], MD;  Location: [place];  Service: Vascular;  Laterality: Left;   BACK SURGERY     BREAST LUMPECTOMY Left 01/2003   BREAST LUMPECTOMY Right 2014   BREAST LUMPECTOMY WITH NEEDLE LOCALIZATION AND AXILLARY SENTINEL LYMPH NODE BX Right 10/13/2012   Procedure: BREAST LUMPECTOMY WITH NEEDLE LOCALIZATION;  Surgeon: [name], MD;  Location: [place];  Service: General;  Laterality: Right;  Right breast wire localized lumpectomy   INSERTION OF DIALYSIS CATHETER Right 04/20/2019   Procedure: INSERTION OF DIALYSIS CATHETER, right internal jugular;  Surgeon: [name], MD;  Location: [place];  Service: Vascular;  Laterality: Right;   INSERTION OF DIALYSIS CATHETER Right 09/24/2020   Procedure: INSERTION OF TUNNELED DIALYSIS CATHETER;  Surgeon: Rosetta Posner, MD;  Location: [place];  Service: Vascular;  Laterality: Right;   IR FLUORO GUIDE CV LINE RIGHT  09/22/2020   IR THORACENTESIS ASP PLEURAL SPACE W/IMG GUIDE  05/19/2019   IR US GUIDE VASC ACCESS LEFT  09/22/2020   IR US GUIDE VASC ACCESS RIGHT  09/22/2020   IR VENOCAVAGRAM SVC  09/22/2020   LAMINECTOMY  05/27/2009   Lumbar decompressive laminectomy, fusion and plating for lumbar spinal stensosis   LIGATION OF ARTERIOVENOUS  FISTULA Left 09/24/2020   Procedure: LIGATION OF LEFT  ARM ARTERIOVENOUS  FISTULA;  Surgeon: Rosetta Posner, MD;  Location: [place];  Service: Vascular;  Laterality: Left;   LUMBAR LAMINECTOMY/DECOMPRESSION MICRODISCECTOMY Left 03/23/2013   Procedure: LUMBAR LAMINECTOMY/DECOMPRESSION MICRODISCECTOMY 1 LEVEL;  Surgeon: Eustace Moore, MD;  Location: [place] [facility];  Service: Neurosurgery;  Laterality: Left;  LUMBAR LAMINECTOMY/DECOMPRESSION MICRODISCECTOMY 1 LEVEL   MASTECTOMY, PARTIAL Left 02/26/2003   ; S/P re-excision of cranial and lateral margins 04/19/2003.    RE-EXCISION OF BREAST CANCER,SUPERIOR MARGINS Right 10/27/2012   Procedure: RE-EXCISION OF BREAST CANCER,SUPERIOR and inferior MARGINS;  Surgeon: [name], MD;  Location: [place];  Service: General;  Laterality: Right;   RE-EXCISION OF BREAST LUMPECTOMY Left 04/2003   TEE WITHOUT CARDIOVERSION N/A 04/04/2019   Procedure: Transesophageal Echocardiogram ([place]);  Surgeon: Wonda Olds, MD;  Location: [place];  Service: Open Heart Surgery;  Laterality: N/A;   THORACIC AORTIC ANEURYSM REPAIR N/A 04/04/2019   Procedure: THORACIC ASCENDING ANEURYSM REPAIR (AAA)  USING 28 MM X 30 CM HEMASHIELD PLATINUM VASCULAR GRAFT;  Surgeon: Wonda Olds, MD;  Location: [facility];  Service: Open Heart Surgery;  Laterality: N/A;   Patient Active Problem List   Diagnosis Date Noted   Right hemiparesis ([place]) 04/06/2022   Acute cardioembolic stroke ([place]) 61/95/0932   Stroke (cerebrum) ([place]) 02/23/2022   Stroke ([place]) 02/23/2022   Hypervolemia associated with renal insufficiency 01/29/2022   Dyslipidemia 01/29/2022   DNR (do not resuscitate) 01/29/2022   Polysubstance abuse ([place]) 01/06/2022   Acute on chronic combined systolic and diastolic CHF (congestive heart failure) ([place]) 01/06/2022   Abdominal pain 67/05/4579   Acute metabolic encephalopathy 99/83/3825   Malignant HTN with heart disease, w/o CHF, 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 12/24/2021   History of hepatitis C 12/08/2021   Hypertensive urgency 10/29/2021    Fluid overload 10/29/2021   Acute on chronic HFrEF (heart failure with reduced ejection fraction) ([place]) 10/29/2021   Chronic anticoagulation 10/29/2021   Prolonged QT interval 10/29/2021   Leukopenia 10/29/2021   SOB (shortness of breath) 09/15/2021   Hypertensive emergency 08/23/2021   Altered mental status    ESRD (end stage renal disease) on dialysis with hyperkalemia and metabolic acidosis with elevated AG  08/12/2021   Malignant hypertension 06/19/2021   Anemia 06/05/2021   Pure hypercholesterolemia 02/14/2021   Acute pulmonary edema ([place]) 12/23/2020   Acute respiratory failure with hypoxia ([place]) 09/21/2020   Acute renal failure superimposed on chronic kidney disease ([place]) 09/21/2020   Community acquired pneumonia 09/21/2020   Uremia 05/39/7673   Metabolic acidosis with increased anion gap and accumulation of organic acids 09/21/2020   Hypertensive crisis 09/21/2020   Troponin level elevated 08/28/2019   Positive D dimer 08/28/2019   Hyperkalemia 08/28/2019   SVT (supraventricular tachycardia) 08/28/2019   SIRS (systemic inflammatory response syndrome) ([place]) 08/27/2019   Paroxysmal SVT (supraventricular tachycardia) 08/27/2019   Aortic atherosclerosis ([place]) 07/05/2019   Chest pain 07/05/2019   History of CVA (cerebrovascular accident) 05/29/2019   AF (paroxysmal atrial fibrillation) (  [place]) 05/29/2019   ESRD on dialysis ([place]) 05/29/2019   Cerebral thrombosis with cerebral infarction 05/22/2019   Malnutrition of moderate degree 05/19/2019   Pleural effusion 05/18/2019   Atrial fibrillation with RVR ([place]) 05/18/2019   S/P aortic aneurysm repair 04/07/2019   Aortic dissection ([place]) 04/04/2019   Dissection of aorta ([place]) 04/03/2019   Non compliance with medical treatment 12/04/2017   Chronic obstructive lung disease ([place]) 01/16/2017   Chronic low back pain 06/22/2016   Insomnia 03/14/2015   S/P lumbar spinal fusion 01/18/2014   Tobacco use disorder 04/19/2009   GERD  09/16/2006    ONSET DATE: 02/24/22  REFERRING DIAG: Acute Stroke  THERAPY DIAG:  Muscle weakness (generalized)  Difficulty in walking, not elsewhere classified  Other lack of coordination  Rationale for Evaluation and Treatment: Rehabilitation  SUBJECTIVE:                                                                                                                                                                                             SUBJECTIVE STATEMENT:   Patient reports no pain in legs, but having pain in her R arm and hand.     Pt accompanied by: self  PERTINENT HISTORY: see above  PAIN:  Are you having pain? No  PRECAUTIONS: None  WEIGHT BEARING RESTRICTIONS: No  FALLS: Has patient fallen in last 6 months? No  LIVING ENVIRONMENT: Lives with: lives with an adult companion Lives in: House/apartment Stairs: Yes: Internal: 15 steps; on left going up Has following equipment at home: None  PLOF:  cook, clean, some flowers  PATIENT GOALS: walk better,   OBJECTIVE:   COGNITION: Overall cognitive status: Within functional limits for tasks assessed   SENSATION: WFL  COORDINATION: LE pretty good, some difficulty with foot motions/ankle   LOWER EXTREMITY ROM:     Active  Right Eval Left Eval  Hip flexion 90   Hip extension    Hip abduction    Hip adduction    Hip internal rotation    Hip external rotation    Knee flexion    Knee extension 6   Ankle dorsiflexion 10   Ankle plantarflexion    Ankle inversion 5   Ankle eversion 12    (Blank rows = not tested)  LOWER EXTREMITY MMT:    MMT Right Eval Left Eval  Hip flexion 4-   Hip extension    Hip abduction 4   Hip adduction 4   Hip internal rotation    Hip external rotation    Knee flexion 4-   Knee extension 3+   Ankle dorsiflexion 4-  Ankle plantarflexion 4-   Ankle inversion 4-   Ankle eversion 4-   (Blank rows = not tested)  TRANSFERS: Assistive device utilized: None  Sit  to stand: Complete Independence Stand to sit: Complete Independence  STAIRS: Level of Assistance: SBA Stair Negotiation Technique: Alternating Pattern  with Single Rail on Right Number of Stairs: 4  Height of Stairs: 6  Comments: some impulsivity and unsafe with turns  GAIT: Gait pattern: WFL Distance walked: 60 feet Assistive device utilized: None Level of assistance: SBA Comments: some issues with turns and on uneven surfaces  FUNCTIONAL TESTS:  5 times sit to stand: 16 Timed up and go (TUG): 13 Berg Balance Scale: 48/56  TODAY'S TREATMENT:                                                                                                                              DATE:   04/08/22 NuStep L5x21mns  LAQ 2# 2x10 HS curls greenTB 2x10 S2S 2x10 Ball squeezes 2x10  Hip abd greenTB 2x10 Step ups on airex- CGA  Walking on beam in // bars needs 1 HHA   PATIENT EDUCATION: Education details: POC Person educated: Patient and sister Education method: Explanation Education comprehension: verbalized understanding  HOME EXERCISE PROGRAM: TBD for LE and balance   GOALS: Goals reviewed with patient? Yes  SHORT TERM GOALS: Target date: 04/15/22  Independent with initial HEP Goal status: INITIAL  LONG TERM GOALS: Target date: 06/24/21  Increase Berg balance score to 52/56 Goal status: INITIAL  2.  Understand safety and fall risks at home Goal status: INITIAL  3.  Increase right LE strength to 4+/5 Goal status: INITIAL  4.  Walk >1000 feet uneven terrain without difficulty Goal status: INITIAL  ASSESSMENT:  CLINICAL IMPRESSION: Patient returns with no reports of falls or pain in her legs. Her R hand is swollen today she denies any falls, was seen by OT prior to PT visit and was given some exercises and wrapped it up to help with the swelling. We focused on some light strengthening and functional activities today. She tolerates session well. Some difficulty  understanding patient throughout session due to impaired speech. One instance of tripping on step up on airex. Will benefit from ongoing balance training.   OBJECTIVE IMPAIRMENTS: Abnormal gait, cardiopulmonary status limiting activity, decreased activity tolerance, decreased balance, decreased coordination, decreased endurance, decreased mobility, difficulty walking, decreased ROM, decreased strength, and decreased safety awareness.   REHAB POTENTIAL: Good  CLINICAL DECISION MAKING: Stable/uncomplicated  EVALUATION COMPLEXITY: Low  PLAN:  PT FREQUENCY: 2x/week  PT DURATION: 12 weeks  PLANNED INTERVENTIONS: Therapeutic exercises, Therapeutic activity, Neuromuscular re-education, Balance training, Gait training, Patient/Family education, Self Care, Joint mobilization, Stair training, and Manual therapy  PLAN FOR NEXT SESSION: Overall she is progressing well after stroke, she has the most deficits with her right hand and her speech, some deficits with strength and motions of the LE, some impulsivity and balance issues  [place], PT 04/08/2022, 4:56 PM

## 2022-04-08 ENCOUNTER — Ambulatory Visit: Payer: Medicare Other | Admitting: Rehabilitative and Restorative Service Providers"

## 2022-04-08 ENCOUNTER — Encounter: Payer: Self-pay | Admitting: Physician Assistant

## 2022-04-08 ENCOUNTER — Ambulatory Visit: Payer: Medicare Other

## 2022-04-08 ENCOUNTER — Telehealth: Payer: Self-pay | Admitting: Physical Medicine and Rehabilitation

## 2022-04-08 ENCOUNTER — Ambulatory Visit: Payer: Medicare Other | Attending: Physician Assistant | Admitting: Physician Assistant

## 2022-04-08 ENCOUNTER — Encounter: Payer: Self-pay | Admitting: Rehabilitative and Restorative Service Providers"

## 2022-04-08 VITALS — BP 148/82 | HR 94 | Ht 63.0 in | Wt 109.0 lb

## 2022-04-08 DIAGNOSIS — I10 Essential (primary) hypertension: Secondary | ICD-10-CM

## 2022-04-08 DIAGNOSIS — I7101 Dissection of ascending aorta: Secondary | ICD-10-CM | POA: Diagnosis not present

## 2022-04-08 DIAGNOSIS — R278 Other lack of coordination: Secondary | ICD-10-CM

## 2022-04-08 DIAGNOSIS — I480 Paroxysmal atrial fibrillation: Secondary | ICD-10-CM | POA: Diagnosis not present

## 2022-04-08 DIAGNOSIS — I700 Atherosclerosis of aorta: Secondary | ICD-10-CM

## 2022-04-08 DIAGNOSIS — I5043 Acute on chronic combined systolic (congestive) and diastolic (congestive) heart failure: Secondary | ICD-10-CM

## 2022-04-08 DIAGNOSIS — R600 Localized edema: Secondary | ICD-10-CM

## 2022-04-08 DIAGNOSIS — M6281 Muscle weakness (generalized): Secondary | ICD-10-CM

## 2022-04-08 DIAGNOSIS — R262 Difficulty in walking, not elsewhere classified: Secondary | ICD-10-CM

## 2022-04-08 DIAGNOSIS — I639 Cerebral infarction, unspecified: Secondary | ICD-10-CM | POA: Diagnosis not present

## 2022-04-08 DIAGNOSIS — M25631 Stiffness of right wrist, not elsewhere classified: Secondary | ICD-10-CM

## 2022-04-08 DIAGNOSIS — G8191 Hemiplegia, unspecified affecting right dominant side: Secondary | ICD-10-CM

## 2022-04-08 DIAGNOSIS — M25641 Stiffness of right hand, not elsewhere classified: Secondary | ICD-10-CM

## 2022-04-08 DIAGNOSIS — I63412 Cerebral infarction due to embolism of left middle cerebral artery: Secondary | ICD-10-CM

## 2022-04-08 MED ORDER — CARVEDILOL 12.5 MG PO TABS: 12.5000 mg | ORAL_TABLET | Freq: Two times a day (BID) | ORAL | 11 refills | Status: DC

## 2022-04-08 NOTE — Telephone Encounter (Signed)
Patient is requesting shower chair with back and shower head that will detach.

## 2022-04-08 NOTE — Progress Notes (Signed)
Cardiology Office Note:    Date:  04/08/2022   ID:  Monique Henderson, DOB 11/24/1950, MRN [id number]  PCP:  Dorna Mai, MD  [facility] Cardiologist:  None will establish care with Dr. Ollen [facility] Electrophysiologist:  None   Chief Complaint: Hospital follow-up  History of Present Illness:    Monique Henderson is a 71 y.o. 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   Previously seen by [facility] and [place] [facility].  Patient was seen by Dr. Terri Skains for elevated troponin 06/2021 in hospital. Echocardiogram with LV function of 40 to 45% with regional wall motion abnormality, grade 2 diastolic dysfunction with restrictive physiology.  Follow-up stress test was intermediate risk study.  No reversible ischemia.  LV function 40%  Multiple admission for hypertensive urgency and volume overload due to noncompliance with dialysis.  Patient admitted 01/2022 for acute/subacute stroke.  Underwent inpatient rehab. Echocardiogram during admission showed LV function of 30 to 81%, grade 1 diastolic dysfunction and severe LVH.   Patient is here for follow-up.  She has residual right-sided weakness and facial droop with slurred speech.  This is improving per sister who present during office visit.  Patient lives by herself but 2 sister takes care of her.  Denies chest pain, shortness of breath, orthopnea, PND, syncope, lower extremity edema or melena.  Reports compliance with dialysis.  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 Autumn Messing; S/P re-excision of superior and inferior margins 10/27/2012.    ESRD on hemodialysis ([place]) 05/29/2019   Essential hypertension 09/16/2006   GERD 09/16/2006   Hepatitis C    treated and RNA confirmed not detectable 01/2017   Insomnia 03/14/2015   Malnutrition of moderate degree 05/19/2019   Non compliance with medical treatment 12/04/2017   Normocytic anemia    With thrombocytosis   Osteoarthritis    Right ureteral stone 2002   S/P lumbar spinal fusion 01/18/2014   S/P lumbar decompressive laminectomy, fusion, and plating for lumbar spinal stenosis on 05/27/2009 by Dr. Eustace Moore.  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 Eustace Moore.     Tobacco use disorder 04/19/2009   Uterine fibroid    Wears dentures    top    Past Surgical History:  Procedure Laterality Date   ANTERIOR LAT LUMBAR FUSION N/A 01/18/2014   Procedure: ANTERIOR LATERAL LUMBAR FUSION LUMBAR TWO-THREE;  Surgeon: Eustace Moore, MD;  Location: [place] [facility]UR[facility] ORS;  Service: Neurosurgery;  Laterality: N/A;   ANTERIOR LUMBAR FUSION  01/18/2014   AV FISTULA PLACEMENT Left 04/20/2019  Procedure: ARTERIOVENOUS (AV) FISTULA CREATION;  Surgeon: [name], MD;  Location: [place];  Service: Vascular;  Laterality: Left;   BACK SURGERY     BREAST LUMPECTOMY Left 01/2003   BREAST LUMPECTOMY Right 2014   BREAST LUMPECTOMY WITH NEEDLE LOCALIZATION AND  AXILLARY SENTINEL LYMPH NODE BX Right 10/13/2012   Procedure: BREAST LUMPECTOMY WITH NEEDLE LOCALIZATION;  Surgeon: [name], MD;  Location: [place];  Service: General;  Laterality: Right;  Right breast wire localized lumpectomy   INSERTION OF DIALYSIS CATHETER Right 04/20/2019   Procedure: INSERTION OF DIALYSIS CATHETER, right internal jugular;  Surgeon: [name], MD;  Location: [place];  Service: Vascular;  Laterality: Right;   INSERTION OF DIALYSIS CATHETER Right 09/24/2020   Procedure: INSERTION OF TUNNELED DIALYSIS CATHETER;  Surgeon: Rosetta Posner, MD;  Location: [place];  Service: Vascular;  Laterality: Right;   IR FLUORO GUIDE CV LINE RIGHT  09/22/2020   IR THORACENTESIS ASP PLEURAL SPACE W/IMG GUIDE  05/19/2019   IR US GUIDE VASC ACCESS LEFT  09/22/2020   IR US GUIDE VASC ACCESS RIGHT  09/22/2020   IR VENOCAVAGRAM SVC  09/22/2020   LAMINECTOMY  05/27/2009   Lumbar decompressive laminectomy, fusion and plating for lumbar spinal stensosis   LIGATION OF ARTERIOVENOUS  FISTULA Left 09/24/2020   Procedure: LIGATION OF LEFT ARM ARTERIOVENOUS  FISTULA;  Surgeon: Rosetta Posner, MD;  Location: [place];  Service: Vascular;  Laterality: Left;   LUMBAR LAMINECTOMY/DECOMPRESSION MICRODISCECTOMY Left 03/23/2013   Procedure: LUMBAR LAMINECTOMY/DECOMPRESSION MICRODISCECTOMY 1 LEVEL;  Surgeon: Eustace Moore, MD;  Location: [facility];  Service: Neurosurgery;  Laterality: Left;  LUMBAR LAMINECTOMY/DECOMPRESSION MICRODISCECTOMY 1 LEVEL   MASTECTOMY, PARTIAL Left 02/26/2003   ; S/P re-excision of cranial and lateral margins 04/19/2003.    RE-EXCISION OF BREAST CANCER,SUPERIOR MARGINS Right 10/27/2012   Procedure: RE-EXCISION OF BREAST CANCER,SUPERIOR and inferior MARGINS;  Surgeon: [name], MD;  Location: [place];  Service: General;  Laterality: Right;   RE-EXCISION OF BREAST LUMPECTOMY Left 04/2003   TEE WITHOUT CARDIOVERSION N/A 04/04/2019   Procedure: Transesophageal  Echocardiogram ([place]);  Surgeon: Wonda Olds, MD;  Location: [place];  Service: Open Heart Surgery;  Laterality: N/A;   THORACIC AORTIC ANEURYSM REPAIR N/A 04/04/2019   Procedure: THORACIC ASCENDING ANEURYSM REPAIR (AAA)  USING 28 MM X 30 CM HEMASHIELD PLATINUM VASCULAR GRAFT;  Surgeon: Wonda Olds, MD;  Location: [facility];  Service: Open Heart Surgery;  Laterality: N/A;    Current Medications: Current Meds  Medication Sig   albuterol (VENTOLIN HFA) 108 (90 Base) MCG/ACT inhaler Inhale 2 puffs into the lungs every 6 (six) hours as needed for wheezing or shortness of breath.   amLODipine (NORVASC) 10 MG tablet Take 1 tablet (10 mg total) by mouth daily.   apixaban (ELIQUIS) 2.5 MG TABS tablet Take 1 tablet (2.5 mg total) by mouth 2 (two) times daily.   Cholecalciferol (VITAMIN D-3 PO) Take 1 capsule by mouth daily.   cloNIDine (CATAPRES - DOSED IN MG/24 HR) 0.1 mg/24hr patch Place 1 patch (0.1 mg total) onto the skin every Saturday.   diphenhydrAMINE-zinc acetate (BENADRYL) cream Apply topically 3 (three) times daily as needed for itching.   hydrALAZINE (APRESOLINE) 25 MG tablet Take 25 mg by mouth 2 (two) times daily.   isosorbide mononitrate (IMDUR) 30 MG 24 hr tablet Take 1 tablet (30 mg total) by mouth daily.   lidocaine (LIDODERM) 5 % Place 1 patch  onto the skin daily. Remove & Discard patch within 12 hours or as directed by MD   naloxone (NARCAN) nasal spray 4 mg/0.1 mL 1 spray as directed.   oxyCODONE-acetaminophen (PERCOCET) 10-325 MG tablet Take 1 tablet by mouth 3 (three) times daily as needed.   rosuvastatin (CRESTOR) 10 MG tablet Take 1 tablet (10 mg total) by mouth daily.   sevelamer carbonate (RENVELA) 2.4 g PACK Take 2.4 g by mouth 3 (three) times daily with meals.   traZODone (DESYREL) 50 MG tablet Take 0.5 tablets (25 mg total) by mouth at bedtime as needed for sleep.   [DISCONTINUED] carvedilol (COREG) 6.25 MG tablet Take 1 tablet (6.25 mg total) by mouth 2 (two) times  daily with a meal.     Allergies:   Shrimp [shellfish allergy], Bactroban [mupirocin], Chlorhexidine gluconate, Tylenol [acetaminophen], and Zestril [lisinopril]   Social History   Socioeconomic History   Marital status: Single    Spouse name: Not on file   Number of children: 2   Years of education: Not on file   Highest education level: Not on file  Occupational History   Occupation: disabled  Tobacco Use   Smoking status: Every Day    Packs/day: 0.25    Years: 44.00    Total pack years: 11.00    Types: Cigarettes    Last attempt to quit: 05/24/2019    Years since quitting: 2.8   Smokeless tobacco: Never   Tobacco comments:    smoking less  Vaping Use   Vaping Use: Never used  Substance and Sexual Activity   Alcohol use: Not Currently    Alcohol/week: 2.0 standard drinks of alcohol    Types: 2 Cans of beer per week    Comment: "couple beers a weekend", not currently   Drug use: Not Currently    Types: Cocaine    Comment: last in 2005   Sexual activity: Yes    Birth control/protection: Post-menopausal  Other Topics Concern   Not on file  Social History Narrative   Not on file   Social Determinants of Health   Financial Resource Strain: Not on file  Food Insecurity: No Food Insecurity (03/12/2022)   Hunger Vital Sign    Worried About Running Out of Food in the Last Year: Never true    Ran Out of Food in the Last Year: Never true  Transportation Needs: No Transportation Needs (03/12/2022)   PRAPARE - Hydrologist (Medical): No    Lack of Transportation (Non-Medical): No  Physical Activity: Not on file  Stress: Not on file  Social Connections: Not on file     Family History: The patient's family history includes Bone cancer (age of onset: 19) in her sister; Colon cancer (age of onset: 19) in her mother; Diabetes in her brother, brother, and son; Diabetes (age of onset: 89) in her sister; Hypertension in her brother, brother,  mother, sister, and son; Kidney disease in her son; Multiple sclerosis in her son. There is no history of Breast cancer or Cervical cancer.    ROS:   Please see the history of present illness.    All other systems reviewed and are negative.   EKGs/Labs/Other Studies Reviewed:    The following studies were reviewed today:  Echo 02/25/2022 1. Left ventricular ejection fraction, by estimation, is 30 to 35%. Left  ventricular ejection fraction by PLAX is 30 %. The left ventricle has  moderately decreased function. The left ventricle demonstrates global  hypokinesis. There is severe concentric  left ventricular hypertrophy. Left ventricular diastolic parameters are  consistent with Grade I diastolic dysfunction (impaired relaxation).   2. Right ventricular systolic function is normal. The right ventricular  size is normal. There is normal pulmonary artery systolic pressure.   3. Left atrial size was severely dilated.   4. The mitral valve is normal in structure. Trivial mitral valve  regurgitation. No evidence of mitral stenosis.   5. The aortic valve is tricuspid. Aortic valve regurgitation is not  visualized. No aortic stenosis is present.   6. The inferior vena cava is normal in size with greater than 50%  respiratory variability, suggesting right atrial pressure of 3 mmHg.   Stres test 06/2021 MPRESSION: 1. No reversible ischemia.  Moderate apical scarring/infarct.   2.  Generalized hypokinesis, severe at the apex.   3. Left ventricular ejection fraction 41%. Moderately abnormal LV systolic volume.   4. Non invasive risk stratification*: Intermediate    EKG:  EKG is  ordered today.  The ekg ordered today demonstrates sinus rhythm, right bundle blanch block  Recent Labs: 06/20/2021: TSH 0.189 02/23/2022: B Natriuretic Peptide >4,500.0 02/27/2022: ALT 12; Magnesium 2.2 03/07/2022: BUN 62; Creatinine, Ser 9.82; Hemoglobin 10.6; Platelets 242; Potassium 5.0; Sodium 135  Recent  Lipid Panel    Component Value Date/Time   CHOL 104 02/24/2022 0411   CHOL 167 05/13/2020 1108   TRIG 43 02/24/2022 0411   HDL 49 02/24/2022 0411   HDL 39 (L) 05/13/2020 1108   CHOLHDL 2.1 02/24/2022 0411   VLDL 9 02/24/2022 0411   LDLCALC 46 02/24/2022 0411   LDLCALC 100 (H) 05/13/2020 1108    Physical Exam:    VS:  BP (!) 148/82   Pulse 94   Ht '5\' 3"'$  (1.6 m)   Wt 109 lb (49.4 kg)   SpO2 96%   BMI 19.31 kg/m     Wt Readings from Last 3 Encounters:  04/08/22 109 lb (49.4 kg)  04/06/22 108 lb 9.6 oz (49.3 kg)  04/01/22 107 lb (48.5 kg)     GEN:  Well nourished, well developed in no acute distress HEENT: Normal NECK: No JVD; No carotid bruits LYMPHATICS: No lymphadenopathy CARDIAC: RRR, no murmurs, rubs, gallops RESPIRATORY:  Clear to auscultation without rales, wheezing or rhonchi  ABDOMEN: Soft, non-tender, non-distended MUSCULOSKELETAL:  No edema; No deformity  SKIN: Warm and dry NEUROLOGIC:  Alert and oriented x 3 PSYCHIATRIC:  Normal affect   ASSESSMENT AND PLAN:    Chronic combined CHF -Echo 08/2020: LV function 55 to 60%, regional wall motion abnormality, grade 2 diastolic dysfunction -Echo 06/2021: LV function 40 to 45%, regional wall motion abnormality, grade 3 diastolic dysfunction -Stress test January 2023: Intermediate risk, no reversible ischemia -Echo 01/2022: LV function 30 to 45%, grade 1 diastolic dysfunction, severe concentric LVH -Patient appears euvolemic.  Volume managed by dialysis. -We will plan to titrate guideline directed medical therapy and repeat echocardiogram afterwards.  If still low, will consider ischemic evaluation.  She does not have any anginal symptoms. -Increase carvedilol to 12.5 mg twice daily -Continue hydralazine 25 mg twice daily and Imdur 30 mg daily -No SGLT2 or ACE/ARB/Entresto due to end-stage renal disease  2. 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  PAF -Maintaining sinus rhythm.  No bleeding issue -Continue Eliquis -Increase beta-blocker as above  4. HTN -Blood pressure elevated. No record of blood pressure during and after dialysis.  Discussed with sister to keep track. -We will plan to titrate heart failure regimen and eventually discontinuation of clonidine -Increase beta-blocker as above -Continue hydralazine, Imdur, clonidine and amlodipine at current dose.  5. ESRD on HD    Medication Adjustments/Labs and Tests Ordered: Current medicines are reviewed at length with the patient today.  Concerns regarding medicines are outlined above.  Orders Placed This Encounter  Procedures   EKG 12-Lead   Meds ordered this encounter  Medications   carvedilol (COREG) 12.5 MG tablet    Sig: Take 1 tablet (12.5 mg total) by mouth 2 (two) times daily with a meal.    Dispense:  60 tablet    Refill:  11    Patient Instructions  Medication Instructions:   INCREASE Coreg one (1) tablet by mouth ( 12.5 mg) twice daily.  You can use two (2) tablets by mouth twice daily to use them up.   *If you need a refill on your cardiac medications before your next appointment, please call your pharmacy*   Lab Work:  None ordered.  If you have labs (blood work) drawn today and your tests are completely normal, you will receive your results only by: [place] (if you have MyChart) OR A paper copy in the mail If you have any lab test that is abnormal or we need to change your treatment, we will call you to review the results.   Testing/Procedures:  None ordered.   Follow-Up: At [facility], you and your health needs are our priority.  As part of our continuing mission to provide you with exceptional heart care, we have created designated Provider Care Teams.  These Care Teams include your primary Cardiologist (physician) and Advanced Practice Providers (APPs -  Physician Assistants and Nurse Practitioners) who all work  together to provide you with the care you need, when you need it.  We recommend signing up for the patient portal called "MyChart".  Sign up information is provided on this After Visit Summary.  MyChart is used to connect with patients for Virtual Visits (Telemedicine).  Patients are able to view lab/test results, encounter notes, upcoming appointments, etc.  Non-urgent messages can be sent to your provider as well.   To learn more about what you can do with MyChart, go to [email or web address].    Your next appointment:   4 week(s)  The format for your next appointment:   In Person  Provider:   Melina Copa, PA-C     Then, Dr. Casandra Doffing will plan to see you again in 3 month(s).    Important Information About Sugar         Jarrett Soho, PA  04/08/2022 2:21 PM    [facility]

## 2022-04-08 NOTE — Patient Instructions (Signed)
Medication Instructions:   INCREASE Coreg one (1) tablet by mouth ( 12.5 mg) twice daily.  You can use two (2) tablets by mouth twice daily to use them up.   *If you need a refill on your cardiac medications before your next appointment, please call your pharmacy*   Lab Work:  None ordered.  If you have labs (blood work) drawn today and your tests are completely normal, you will receive your results only by: [place] (if you have MyChart) OR A paper copy in the mail If you have any lab test that is abnormal or we need to change your treatment, we will call you to review the results.   Testing/Procedures:  None ordered.   Follow-Up: At [facility], you and your health needs are our priority.  As part of our continuing mission to provide you with exceptional heart care, we have created designated Provider Care Teams.  These Care Teams include your primary Cardiologist (physician) and Advanced Practice Providers (APPs -  Physician Assistants and Nurse Practitioners) who all work together to provide you with the care you need, when you need it.  We recommend signing up for the patient portal called "MyChart".  Sign up information is provided on this After Visit Summary.  MyChart is used to connect with patients for Virtual Visits (Telemedicine).  Patients are able to view lab/test results, encounter notes, upcoming appointments, etc.  Non-urgent messages can be sent to your provider as well.   To learn more about what you can do with MyChart, go to [email or web address].    Your next appointment:   4 week(s)  The format for your next appointment:   In Person  Provider:   Melina Copa, PA-C     Then, Dr. Casandra Doffing will plan to see you again in 3 month(s).    Important Information About Sugar

## 2022-04-09 ENCOUNTER — Other Ambulatory Visit: Payer: Self-pay | Admitting: Family Medicine

## 2022-04-09 DIAGNOSIS — Z8673 Personal history of transient ischemic attack (TIA), and cerebral infarction without residual deficits: Secondary | ICD-10-CM

## 2022-04-13 ENCOUNTER — Encounter: Payer: Self-pay | Admitting: Physical Therapy

## 2022-04-13 ENCOUNTER — Encounter: Payer: Self-pay | Admitting: Rehabilitative and Restorative Service Providers"

## 2022-04-13 ENCOUNTER — Ambulatory Visit: Payer: Medicare Other | Admitting: Rehabilitative and Restorative Service Providers"

## 2022-04-13 ENCOUNTER — Ambulatory Visit: Payer: Medicare Other | Admitting: Physical Therapy

## 2022-04-13 ENCOUNTER — Other Ambulatory Visit: Payer: Self-pay

## 2022-04-13 ENCOUNTER — Encounter: Payer: Self-pay | Admitting: Speech Pathology

## 2022-04-13 ENCOUNTER — Other Ambulatory Visit: Payer: Self-pay | Admitting: Vascular Surgery

## 2022-04-13 ENCOUNTER — Ambulatory Visit: Payer: Medicare Other | Admitting: Speech Pathology

## 2022-04-13 DIAGNOSIS — M25641 Stiffness of right hand, not elsewhere classified: Secondary | ICD-10-CM

## 2022-04-13 DIAGNOSIS — M25631 Stiffness of right wrist, not elsewhere classified: Secondary | ICD-10-CM

## 2022-04-13 DIAGNOSIS — R1310 Dysphagia, unspecified: Secondary | ICD-10-CM

## 2022-04-13 DIAGNOSIS — M6281 Muscle weakness (generalized): Secondary | ICD-10-CM

## 2022-04-13 DIAGNOSIS — R471 Dysarthria and anarthria: Secondary | ICD-10-CM

## 2022-04-13 DIAGNOSIS — R278 Other lack of coordination: Secondary | ICD-10-CM

## 2022-04-13 DIAGNOSIS — R600 Localized edema: Secondary | ICD-10-CM

## 2022-04-13 DIAGNOSIS — R41841 Cognitive communication deficit: Secondary | ICD-10-CM

## 2022-04-13 DIAGNOSIS — I639 Cerebral infarction, unspecified: Secondary | ICD-10-CM | POA: Diagnosis not present

## 2022-04-13 DIAGNOSIS — N186 End stage renal disease: Secondary | ICD-10-CM

## 2022-04-13 NOTE — Therapy (Signed)
OUTPATIENT PHYSICAL THERAPY NEURO TREATMENT   Patient Name: Monique Henderson MRN: [id number] DOB:[birth year], 71 y.o., female Today's Date: 04/13/2022   PCP: Dorna Mai REFERRING PROVIDER: Love   PT End of Session - 04/13/22 1602     Visit Number 3    Date for PT Re-Evaluation 07/01/22    PT Start Time 1602    PT Stop Time 1645    PT Time Calculation (min) 43 min    Activity Tolerance Patient tolerated treatment well    Behavior During Therapy [facility] for tasks assessed/performed             Past Medical History:  Diagnosis Date   AF (paroxysmal atrial fibrillation) ([place]) 05/29/2019   on Coumadin   Aortic atherosclerosis ([place]) 07/05/2019   Aortic dissection ([place]) 04/04/2019   s/p repair   Bone spur 2008   Right calcaneal foot spur   Breast cancer ([place]) 2004   Ductal carcinoma in situ of the left breast; S/P left partial mastectomy 02/26/2003; S/P re-excision of cranial and lateral margins11/18/2004.radiation   Cerebral thrombosis with cerebral infarction 05/22/2019   Chronic low back pain 06/22/2016   Chronic obstructive lung disease ([place]) 01/16/2017   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 Autumn Messing; S/P re-excision of superior and inferior margins 10/27/2012.    ESRD on hemodialysis ([place]) 05/29/2019   Essential hypertension 09/16/2006   GERD 09/16/2006   Hepatitis C    treated and RNA confirmed not detectable 01/2017   Insomnia 03/14/2015   Malnutrition of moderate degree 05/19/2019   Non compliance with medical treatment 12/04/2017   Normocytic anemia    With thrombocytosis   Osteoarthritis    Right ureteral stone 2002   S/P lumbar spinal fusion 01/18/2014   S/P lumbar decompressive laminectomy, fusion, and plating for lumbar spinal stenosis on 05/27/2009 by Dr. Eustace Moore.  S/P anterolateral retroperitoneal interbody fusion L2-3 utilizing a 8 mm peek interbody cage packed with morcellized  allograft, and anterior lumbar plating L2-3 for recurrent disc herniation L2-3 with spinal stenosis on 01/18/2014 by Dr. Eustace Moore.     Tobacco use disorder 04/19/2009   Uterine fibroid    Wears dentures    top   Past Surgical History:  Procedure Laterality Date   ANTERIOR LAT LUMBAR FUSION N/A 01/18/2014   Procedure: ANTERIOR LATERAL LUMBAR FUSION LUMBAR TWO-THREE;  Surgeon: Eustace Moore, MD;  Location: [place] NEURO ORS;  Service: Neurosurgery;  Laterality: N/A;   ANTERIOR LUMBAR FUSION  01/18/2014   AV FISTULA PLACEMENT Left 04/20/2019   Procedure: ARTERIOVENOUS (AV) FISTULA CREATION;  Surgeon: [name], MD;  Location: [place];  Service: Vascular;  Laterality: Left;   BACK SURGERY     BREAST LUMPECTOMY Left 01/2003   BREAST LUMPECTOMY Right 2014   BREAST LUMPECTOMY WITH NEEDLE LOCALIZATION AND AXILLARY SENTINEL LYMPH NODE BX Right 10/13/2012   Procedure: BREAST LUMPECTOMY WITH NEEDLE LOCALIZATION;  Surgeon: [name], MD;  Location: [place];  Service: General;  Laterality: Right;  Right breast wire localized lumpectomy   INSERTION OF DIALYSIS CATHETER Right 04/20/2019   Procedure: INSERTION OF DIALYSIS CATHETER, right internal jugular;  Surgeon: [name], MD;  Location: [place];  Service: Vascular;  Laterality: Right;   INSERTION OF DIALYSIS CATHETER Right 09/24/2020   Procedure: INSERTION OF TUNNELED DIALYSIS CATHETER;  Surgeon: Rosetta Posner, MD;  Location: [place];  Service: Vascular;  Laterality: Right;  IR FLUORO GUIDE CV LINE RIGHT  09/22/2020   IR THORACENTESIS ASP PLEURAL SPACE W/IMG GUIDE  05/19/2019   IR US GUIDE VASC ACCESS LEFT  09/22/2020   IR US GUIDE VASC ACCESS RIGHT  09/22/2020   IR VENOCAVAGRAM SVC  09/22/2020   LAMINECTOMY  05/27/2009   Lumbar decompressive laminectomy, fusion and plating for lumbar spinal stensosis   LIGATION OF ARTERIOVENOUS  FISTULA Left 09/24/2020   Procedure: LIGATION OF LEFT ARM ARTERIOVENOUS   FISTULA;  Surgeon: Rosetta Posner, MD;  Location: [place];  Service: Vascular;  Laterality: Left;   LUMBAR LAMINECTOMY/DECOMPRESSION MICRODISCECTOMY Left 03/23/2013   Procedure: LUMBAR LAMINECTOMY/DECOMPRESSION MICRODISCECTOMY 1 LEVEL;  Surgeon: Eustace Moore, MD;  Location: [place] [facility];  Service: Neurosurgery;  Laterality: Left;  LUMBAR LAMINECTOMY/DECOMPRESSION MICRODISCECTOMY 1 LEVEL   MASTECTOMY, PARTIAL Left 02/26/2003   ; S/P re-excision of cranial and lateral margins 04/19/2003.    RE-EXCISION OF BREAST CANCER,SUPERIOR MARGINS Right 10/27/2012   Procedure: RE-EXCISION OF BREAST CANCER,SUPERIOR and inferior MARGINS;  Surgeon: [name], MD;  Location: [place];  Service: General;  Laterality: Right;   RE-EXCISION OF BREAST LUMPECTOMY Left 04/2003   TEE WITHOUT CARDIOVERSION N/A 04/04/2019   Procedure: Transesophageal Echocardiogram ([place]);  Surgeon: Wonda Olds, MD;  Location: [place];  Service: Open Heart Surgery;  Laterality: N/A;   THORACIC AORTIC ANEURYSM REPAIR N/A 04/04/2019   Procedure: THORACIC ASCENDING ANEURYSM REPAIR (AAA)  USING 28 MM X 30 CM HEMASHIELD PLATINUM VASCULAR GRAFT;  Surgeon: Wonda Olds, MD;  Location: [facility];  Service: Open Heart Surgery;  Laterality: N/A;   Patient Active Problem List   Diagnosis Date Noted   Right hemiparesis ([place]) 04/06/2022   Acute cardioembolic stroke ([place]) 36/62/9476   Stroke (cerebrum) ([place]) 02/23/2022   Stroke ([place]) 02/23/2022   Hypervolemia associated with renal insufficiency 01/29/2022   Dyslipidemia 01/29/2022   DNR (do not resuscitate) 01/29/2022   Polysubstance abuse ([place]) 01/06/2022   Acute on chronic combined systolic and diastolic CHF (congestive heart failure) ([place]) 01/06/2022   Abdominal pain 54/65/0354   Acute metabolic encephalopathy 65/68/1275   Malignant HTN with heart disease, w/o CHF, 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 12/24/2021   History of hepatitis C 12/08/2021   Hypertensive urgency 10/29/2021   Fluid overload  10/29/2021   Acute on chronic HFrEF (heart failure with reduced ejection fraction) ([place]) 10/29/2021   Chronic anticoagulation 10/29/2021   Prolonged QT interval 10/29/2021   Leukopenia 10/29/2021   SOB (shortness of breath) 09/15/2021   Hypertensive emergency 08/23/2021   Altered mental status    ESRD (end stage renal disease) on dialysis with hyperkalemia and metabolic acidosis with elevated AG  08/12/2021   Malignant hypertension 06/19/2021   Anemia 06/05/2021   Pure hypercholesterolemia 02/14/2021   Acute pulmonary edema ([place]) 12/23/2020   Acute respiratory failure with hypoxia ([place]) 09/21/2020   Acute renal failure superimposed on chronic kidney disease ([place]) 09/21/2020   Community acquired pneumonia 09/21/2020   Uremia 17/00/1749   Metabolic acidosis with increased anion gap and accumulation of organic acids 09/21/2020   Hypertensive crisis 09/21/2020   Troponin level elevated 08/28/2019   Positive D dimer 08/28/2019   Hyperkalemia 08/28/2019   SVT (supraventricular tachycardia) 08/28/2019   SIRS (systemic inflammatory response syndrome) ([place]) 08/27/2019   Paroxysmal SVT (supraventricular tachycardia) 08/27/2019   Aortic atherosclerosis ([place]) 07/05/2019   Chest pain 07/05/2019   History of CVA (cerebrovascular accident) 05/29/2019   AF (paroxysmal atrial fibrillation) ([place]) 05/29/2019   ESRD on dialysis (  [place]) 05/29/2019   Cerebral thrombosis with cerebral infarction 05/22/2019   Malnutrition of moderate degree 05/19/2019   Pleural effusion 05/18/2019   Atrial fibrillation with RVR ([place]) 05/18/2019   S/P aortic aneurysm repair 04/07/2019   Aortic dissection ([place]) 04/04/2019   Dissection of aorta ([place]) 04/03/2019   Non compliance with medical treatment 12/04/2017   Chronic obstructive lung disease ([place]) 01/16/2017   Chronic low back pain 06/22/2016   Insomnia 03/14/2015   S/P lumbar spinal fusion 01/18/2014   Tobacco use disorder 04/19/2009   GERD 09/16/2006    ONSET  DATE: 02/24/22  REFERRING DIAG: Acute Stroke  THERAPY DIAG:  Cognitive communication deficit  Localized edema  Other lack of coordination  Muscle weakness (generalized)  Rationale for Evaluation and Treatment: Rehabilitation  SUBJECTIVE:                                                                                                                                                                                             SUBJECTIVE STATEMENT:   "Ok"   Pt accompanied by: self  PERTINENT HISTORY: see above  PAIN:  Are you having pain? No  PRECAUTIONS: None  WEIGHT BEARING RESTRICTIONS: No  FALLS: Has patient fallen in last 6 months? No  LIVING ENVIRONMENT: Lives with: lives with an adult companion Lives in: House/apartment Stairs: Yes: Internal: 15 steps; on left going up Has following equipment at home: None  PLOF:  cook, clean, some flowers  PATIENT GOALS: walk better,   OBJECTIVE:   COGNITION: Overall cognitive status: Within functional limits for tasks assessed   SENSATION: WFL  COORDINATION: LE pretty good, some difficulty with foot motions/ankle   LOWER EXTREMITY ROM:     Active  Right Eval Left Eval  Hip flexion 90   Hip extension    Hip abduction    Hip adduction    Hip internal rotation    Hip external rotation    Knee flexion    Knee extension 6   Ankle dorsiflexion 10   Ankle plantarflexion    Ankle inversion 5   Ankle eversion 12    (Blank rows = not tested)  LOWER EXTREMITY MMT:    MMT Right Eval Left Eval  Hip flexion 4-   Hip extension    Hip abduction 4   Hip adduction 4   Hip internal rotation    Hip external rotation    Knee flexion 4-   Knee extension 3+   Ankle dorsiflexion 4-   Ankle plantarflexion 4-   Ankle inversion 4-   Ankle eversion 4-   (Blank rows = not tested)  TRANSFERS: Assistive  device utilized: None  Sit to stand: Complete Independence Stand to sit: Complete Independence  STAIRS: Level  of Assistance: SBA Stair Negotiation Technique: Alternating Pattern  with Single Rail on Right Number of Stairs: 4  Height of Stairs: 6  Comments: some impulsivity and unsafe with turns  GAIT: Gait pattern: WFL Distance walked: 60 feet Assistive device utilized: None Level of assistance: SBA Comments: some issues with turns and on uneven surfaces  FUNCTIONAL TESTS:  5 times sit to stand: 16 Timed up and go (TUG): 13 Berg Balance Scale: 48/56  TODAY'S TREATMENT:                                                                                                                              DATE:   04/13/22 NuStep L3 x 6 min 4 & 6 in step up x 10 eacj Resisted gait 20lb 4 way x 3 each S2S on airex 2x10 Hip abd ball squeeze 2x10 Hip Abd green 2x10 HS curls red 2x10    04/08/22 NuStep L5x61mns  LAQ 2# 2x10 HS curls greenTB 2x10 S2S 2x10 Ball squeezes 2x10  Hip abd greenTB 2x10 Step ups on airex- CGA  Walking on beam in // bars needs 1 HHA   PATIENT EDUCATION: Education details: POC Person educated: Patient and sister Education method: Explanation Education comprehension: verbalized understanding  HOME EXERCISE PROGRAM: TBD for LE and balance   GOALS: Goals reviewed with patient? Yes  SHORT TERM GOALS: Target date: 04/15/22  Independent with initial HEP Goal status: INITIAL  LONG TERM GOALS: Target date: 06/24/21  Increase Berg balance score to 52/56 Goal status: INITIAL  2.  Understand safety and fall risks at home Goal status: INITIAL  3.  Increase right LE strength to 4+/5 Goal status: INITIAL  4.  Walk >1000 feet uneven terrain without difficulty Goal status: INITIAL  ASSESSMENT:  CLINICAL IMPRESSION: Patient returns with no reports of falls or pain . Her R hand is swollen today, she was seen by OT prior to PT. We focused on some light strengthening and functional activities today. Cue not to allow LE to push against table with sit to stands. She  was impulsive with step ups. She tolerates session well. Some difficulty understanding patient throughout session due to impaired speech.   OBJECTIVE IMPAIRMENTS: Abnormal gait, cardiopulmonary status limiting activity, decreased activity tolerance, decreased balance, decreased coordination, decreased endurance, decreased mobility, difficulty walking, decreased ROM, decreased strength, and decreased safety awareness.   REHAB POTENTIAL: Good  CLINICAL DECISION MAKING: Stable/uncomplicated  EVALUATION COMPLEXITY: Low  PLAN:  PT FREQUENCY: 2x/week  PT DURATION: 12 weeks  PLANNED INTERVENTIONS: Therapeutic exercises, Therapeutic activity, Neuromuscular re-education, Balance training, Gait training, Patient/Family education, Self Care, Joint mobilization, Stair training, and Manual therapy  PLAN FOR NEXT SESSION: Overall she is progressing well after stroke, she has the most deficits with her right hand and her speech, some deficits with strength and motions of the LE, some impulsivity and  balance issues   Scot Jun, PTA 04/13/2022, 4:03 PM

## 2022-04-13 NOTE — Therapy (Signed)
OUTPATIENT OCCUPATIONAL THERAPY TREATMENT NOTE  Patient Name: Monique Henderson MRN: [id number] DOB:1950-10-28, 71 y.o., female Today's Date: 04/13/2022  PCP: N/A REFERRING PROVIDER: Bary Leriche, PA-C   OT End of Session - 04/13/22 1519     Visit Number 3    Number of Visits 12    Date for OT Re-Evaluation 05/15/22    Authorization Type [employer] Medicare    Progress Note Due on Visit 10    OT Start Time 1519    OT Stop Time 1600    OT Time Calculation (min) 41 min    Equipment Utilized During Treatment compressive tubular bandages.    Activity Tolerance Patient tolerated treatment well;No increased pain;Patient limited by pain;Patient limited by fatigue    Behavior During Therapy [facility] for tasks assessed/performed             Past Medical History:  Diagnosis Date   AF (paroxysmal atrial fibrillation) ([place]) 05/29/2019   on Coumadin   Aortic atherosclerosis ([place]) 07/05/2019   Aortic dissection ([place]) 04/04/2019   s/p repair   Bone spur 2008   Right calcaneal foot spur   Breast cancer ([place]) 2004   Ductal carcinoma in situ of the left breast; S/P left partial mastectomy 02/26/2003; S/P re-excision of cranial and lateral margins11/18/2004.radiation   Cerebral thrombosis with cerebral infarction 05/22/2019   Chronic low back pain 06/22/2016   Chronic obstructive lung disease ([place]) 01/16/2017   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 Autumn Messing; S/P re-excision of superior and inferior margins 10/27/2012.    ESRD on hemodialysis ([place]) 05/29/2019   Essential hypertension 09/16/2006   GERD 09/16/2006   Hepatitis C    treated and RNA confirmed not detectable 01/2017   Insomnia 03/14/2015   Malnutrition of moderate degree 05/19/2019   Non compliance with medical treatment 12/04/2017   Normocytic anemia    With thrombocytosis   Osteoarthritis    Right ureteral stone 2002   S/P lumbar spinal fusion 01/18/2014   S/P  lumbar decompressive laminectomy, fusion, and plating for lumbar spinal stenosis on 05/27/2009 by Dr. Eustace .  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 Eustace .     Tobacco use disorder 04/19/2009   Uterine fibroid    Wears dentures    top   Past Surgical History:  Procedure Laterality Date   ANTERIOR LAT LUMBAR FUSION N/A 01/18/2014   Procedure: ANTERIOR LATERAL LUMBAR FUSION LUMBAR TWO-THREE;  Surgeon: Eustace , MD;  Location: [place] [facility];  Service: Neurosurgery;  Laterality: N/A;   ANTERIOR LUMBAR FUSION  01/18/2014   AV FISTULA PLACEMENT Left 04/20/2019   Procedure: ARTERIOVENOUS (AV) FISTULA CREATION;  Surgeon: [name], MD;  Location: [place];  Service: Vascular;  Laterality: Left;   BACK SURGERY     BREAST LUMPECTOMY Left 01/2003   BREAST LUMPECTOMY Right 2014   BREAST LUMPECTOMY WITH NEEDLE LOCALIZATION AND AXILLARY SENTINEL LYMPH NODE BX Right 10/13/2012   Procedure: BREAST LUMPECTOMY WITH NEEDLE LOCALIZATION;  Surgeon: [name], MD;  Location: [place];  Service: General;  Laterality: Right;  Right breast wire localized lumpectomy   INSERTION OF DIALYSIS CATHETER Right 04/20/2019   Procedure: INSERTION OF DIALYSIS CATHETER, right internal jugular;  Surgeon: [name], MD;  [facility];  Service: Vascular;  Laterality: Right;   INSERTION OF DIALYSIS CATHETER Right 09/24/2020   Procedure: INSERTION OF TUNNELED DIALYSIS CATHETER;  Surgeon: Rosetta Posner, MD;  Location: [place];  Service: Vascular;  Laterality: Right;   IR FLUORO GUIDE CV LINE RIGHT  09/22/2020   IR THORACENTESIS ASP PLEURAL SPACE W/IMG GUIDE  05/19/2019   IR US GUIDE VASC ACCESS LEFT  09/22/2020   IR US GUIDE VASC ACCESS RIGHT  09/22/2020   IR VENOCAVAGRAM  SVC  09/22/2020   LAMINECTOMY  05/27/2009   Lumbar decompressive laminectomy, fusion and plating for lumbar spinal stensosis   LIGATION OF ARTERIOVENOUS  FISTULA Left 09/24/2020   Procedure: LIGATION OF LEFT ARM ARTERIOVENOUS  FISTULA;  Surgeon: Rosetta Posner, MD;  Location: [place];  Service: Vascular;  Laterality: Left;   LUMBAR LAMINECTOMY/DECOMPRESSION MICRODISCECTOMY Left 03/23/2013   Procedure: LUMBAR LAMINECTOMY/DECOMPRESSION MICRODISCECTOMY 1 LEVEL;  Surgeon: Eustace , MD;  Location: [place] [facility];  Service: Neurosurgery;  Laterality: Left;  LUMBAR LAMINECTOMY/DECOMPRESSION MICRODISCECTOMY 1 LEVEL   MASTECTOMY, PARTIAL Left 02/26/2003   ; S/P re-excision of cranial and lateral margins 04/19/2003.    RE-EXCISION OF BREAST CANCER,SUPERIOR MARGINS Right 10/27/2012   Procedure: RE-EXCISION OF BREAST CANCER,SUPERIOR and inferior MARGINS;  Surgeon: [name], MD;  Location: [place];  Service: General;  Laterality: Right;   RE-EXCISION OF BREAST LUMPECTOMY Left 04/2003   TEE WITHOUT CARDIOVERSION N/A 04/04/2019   Procedure: Transesophageal Echocardiogram ([place]);  Surgeon: Wonda Olds, MD;  Location: [facility];  Service: Open Heart Surgery;  Laterality: N/A;   THORACIC AORTIC ANEURYSM REPAIR N/A 04/04/2019   Procedure: THORACIC ASCENDING ANEURYSM REPAIR (AAA)  USING 28 MM X 30 CM HEMASHIELD PLATINUM VASCULAR GRAFT;  Surgeon: Wonda Olds, MD;  [facility];  Service: Open Heart Surgery;  Laterality: N/A;   Patient Active Problem List   Diagnosis Date Noted   Right hemiparesis ([place]) 04/06/2022   Acute cardioembolic stroke ([place]) 53/66/4403   Stroke (cerebrum) ([place]) 02/23/2022   Stroke ([place]) 02/23/2022   Hypervolemia associated with renal insufficiency 01/29/2022   Dyslipidemia 01/29/2022   DNR (do not resuscitate) 01/29/2022   Polysubstance abuse ([place]) 01/06/2022   Acute on chronic combined systolic and diastolic CHF (congestive heart failure) ([place]) 01/06/2022   Abdominal pain  47/42/5956   Acute metabolic encephalopathy 38/75/6433   Malignant HTN with heart disease, w/o CHF, 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 12/24/2021   History of hepatitis C 12/08/2021   Hypertensive urgency 10/29/2021   Fluid overload 10/29/2021   Acute on chronic HFrEF (heart failure with reduced ejection fraction) ([place]) 10/29/2021   Chronic anticoagulation 10/29/2021   Prolonged QT interval 10/29/2021   Leukopenia 10/29/2021   SOB (shortness of breath) 09/15/2021   Hypertensive emergency 08/23/2021   Altered mental status    ESRD (end stage renal disease) on dialysis with hyperkalemia and metabolic acidosis with elevated AG  08/12/2021   Malignant hypertension 06/19/2021   Anemia 06/05/2021   Pure hypercholesterolemia 02/14/2021   Acute pulmonary edema ([place]) 12/23/2020   Acute respiratory failure with hypoxia ([place]) 09/21/2020   Acute renal failure superimposed on chronic kidney disease ([place]) 09/21/2020   Community acquired pneumonia 09/21/2020   Uremia 29/51/8841   Metabolic acidosis with increased anion gap and accumulation of organic acids 09/21/2020   Hypertensive crisis 09/21/2020   Troponin level elevated 08/28/2019   Positive D dimer 08/28/2019   Hyperkalemia 08/28/2019   SVT (supraventricular tachycardia) 08/28/2019   SIRS (systemic inflammatory  response syndrome) ([place]) 08/27/2019   Paroxysmal SVT (supraventricular tachycardia) 08/27/2019   Aortic atherosclerosis ([place]) 07/05/2019   Chest pain 07/05/2019   History of CVA (cerebrovascular accident) 05/29/2019   AF (paroxysmal atrial fibrillation) ([place]) 05/29/2019   ESRD on dialysis ([place]) 05/29/2019   Cerebral thrombosis with cerebral infarction 05/22/2019   Malnutrition of moderate degree 05/19/2019   Pleural effusion 05/18/2019   Atrial fibrillation with RVR ([place]) 05/18/2019   S/P aortic aneurysm repair 04/07/2019   Aortic dissection ([place]) 04/04/2019   Dissection of aorta ([place]) 04/03/2019   Non compliance with medical  treatment 12/04/2017   Chronic obstructive lung disease ([place]) 01/16/2017   Chronic low back pain 06/22/2016   Insomnia 03/14/2015   S/P lumbar spinal fusion 01/18/2014   Tobacco use disorder 04/19/2009   GERD 09/16/2006    ONSET DATE:  DOI: 02/22/22 (CVA)  REFERRING DIAG: I63.9 (ICD-10-CM) - Cerebral infarction, unspecified  THERAPY DIAG:  Localized edema  Other lack of coordination  Muscle weakness (generalized)  Stiffness of right wrist, not elsewhere classified  Stiffness of right hand, not elsewhere classified  Rationale for Evaluation and Treatment: Rehabilitation  PERTINENT HISTORY: She suffered a stroke on 02/22/22 to her Lt posterior-frontal parietal and anterior-parietal areas causing dysphagia, right hemiplegia, speech issues, and cognitive problems. She spent ~2 weeks in the hospital, in inpatient rehab. At discharge from inpatient rehab, she was noted to have: no right handed grip, 75% intelligible speech, SBA with no device for transfers, dysphagia, HTN, HLD, tachycardia, A-fib, CKD, and chest pain (other than cardiac).   PRECAUTIONS: Fall and Other: Rt hemiplegia, speech, swallow and cognitive issues  WEIGHT BEARING RESTRICTIONS: No   SUBJECTIVE:   SUBJECTIVE STATEMENT: She states feeling itchy around her neck, which happens when she is coming due for a dialysis treatment.  Her sister states her hand swelling did resolve since last seen, but then got worse again- it may time up with last dialysis tx. They are asked to pay attention to this phenomenon.    PAIN:  Are you having pain? No- just "itchy"  Rating: 0/10 at rest now in Right arm from wrist to elbow and hand is very swollen   PATIENT GOALS: To get right dom arm moving better.    OBJECTIVE: (All objective assessments below are from initial evaluation on: 04/01/22 unless otherwise specified.)    HAND DOMINANCE: Right   ADLs: Overall ADLs: States decreased ability to grab, hold household objects,  pain and inability to open containers, perform FMS tasks (manipulate fasteners on clothing), mild to moderate bathing problems as well.    FUNCTIONAL OUTCOME MEASURES: Eval: Quck DASH 38.6% impairment today  (Higher % Score  =  More Impairment)     UPPER EXTREMITY ROM     Shoulder to Wrist AROM Right eval Rt  04/08/22  Shoulder flexion 108* in scaption 118* in scaption  Shoulder abduction 128* 107*  Shoulder extension 68   Shoulder internal rotation    Shoulder external rotation    Elbow flexion 130 138*  Elbow extension (-10) 0*  Forearm supination 65 51*  Forearm pronation  65 60*  Wrist flexion 15   Wrist extension 4   Wrist ulnar deviation 19   Wrist radial deviation 5   (Blank rows = not tested)  Eval: She has some (~10-15*) total motion in composite finger flexion and extension today with prolonged squeezes. Thumb motion is nil.   Hand AROM Right eval  Full Fist Ability (or Gap to Distal Palmar Crease) 5.5cm  gap from MF tip to [facility]  Thumb Opposition to Small Finger (or Gap) unable  Thumb Opposition to Base of Small Finger (or Gap)  unable  (Blank rows = not tested)   UPPER EXTREMITY MMT:    Eval:  Right Arm: Grossly 3+/5 at shoulder, 3+/5 at elbow, 3-/5 at forearm, 2-/5 at wrist and 1+/5 in fingers  MMT Right TBD  Shoulder flexion   Shoulder abduction   Shoulder adduction   Shoulder extension   Shoulder internal rotation   Shoulder external rotation   Middle trapezius   Lower trapezius   Elbow flexion   Elbow extension   Forearm supination   Forearm pronation   Wrist flexion   Wrist extension   Wrist ulnar deviation   Wrist radial deviation   (Blank rows = not tested)  HAND FUNCTION: Eval: Observed weakness in affected hand.  Grip strength Right: 0 lbs, Left: 26 lbs   COORDINATION: Eval: Observed coordination impairments with affected rt hand. Box and Blocks Test: Right unable to grasp a block today (TBD is [facility])  SENSATION: Eval:  Light touch  intact today, equal b/l per pt  EDEMA:   04/13/22: 45cm figure of 8 around Rt hand to start   04/08/22: 48.3cm figure of 8 around Rt hand to start (after manual techniques went down to about 45cm)   Eval:  Moderately swollen in Rt hand, 41.5cm figure of 8 around Rt hand (compared to 38 Lt hand)   COGNITION: Eval: Overall cognitive status: overtly seemed [facility] for evaluation today, though perhaps a bit impulsive.  She is working with SLP for this and speech  OBSERVATIONS:   Eval: She has fairly good motion at shoulder and elbow, speech can be difficult to understand   TODAY'S TREATMENT:  04/13/22:  OT takes her vitals, as she is very lethargic looking compared to eval visit, states feeling "itchy" after not having dialysis for days (since Sat per report). 159/67mHg, 84 pulse, 98% SpO2. Treatment carries on. OT edu on neuro-specific recovery techniques such as developmental reflexive postures performed on plinth in supine (full body crunch with flexion synergy, rolling supine to side lying with ipsilateral flexion, prone on elbow weight shifting) OT refers to as "Rocking and Rolling." She tolerates well with no pain, but difficulty. She follows along well despite lethargy. OT also edu on and PNF D1 flexion and extension as "punch & block kick."  She has difficulty supinating and making fist.  OT then reviews other HEP for AROM as below- she does each 5x only, for review.   She is then taken to PT.   Exercises - Reach arms upward   - 4 x daily - 10 reps - Standing Elbow Flexion Extension AROM  - 4 x daily - 10 reps - Turn PTransMontaigneFacing Up & Down  - 4 x daily - 10 reps - Bend and Pull Back Wrist SLOWLY  - 4 x daily - 10 reps - "Windshield Wipers"   - 4 x daily - 10 reps - Tendon Glides  - 4 x daily - 10 reps - Seated Single Finger Extension  - 4 x daily - 10 reps - Thumb Opposition  - 4 x daily - 10 reps  04/08/22: Due to significant increase in pain and swelling OT has her perform active range of  motion exam which is largely unchanged.  That is a good sign.  Her hand is significantly stiff due to increased swelling however.  OT reviews edema reduction techniques including  retrograde massage wearing compression garments (OT supplies) as well as the importance of keeping hand elevated above heart and keeping it moving (self-care).  OT also gives out a printed, comprehensive HEP packet which she performs back to OT as listed below.  Minimal discomfort during.  OT also recommends an edema glove for her measures her as a size medium) about the appropriate recommendation.  After manual techniques and edema reduction techniques her edema does decrease by about 3 cm today.  Again is a great sign that she is able to help control it if she follows these directions.  Her family was educated in the lobby to encourage her to be doing these things at least 4 or more times a day.  Exercises - Reach arms upward   - 4 x daily - 10 reps - Standing Elbow Flexion Extension AROM  - 4 x daily - 10 reps - Turn [employer] Facing Up & Down  - 4 x daily - 10 reps - Bend and Pull Back Wrist SLOWLY  - 4 x daily - 10 reps - "Windshield Wipers"   - 4 x daily - 10 reps - Tendon Glides  - 4 x daily - 10 reps - Seated Single Finger Extension  - 4 x daily - 10 reps - Thumb Opposition  - 4 x daily - 10 reps  PATIENT EDUCATION: Education details: See tx section above for details  Person educated: Patient Education method: Verbal Instruction, Teach back, Handouts  Education comprehension: States and demonstrates understanding, Additional Education required    HOME EXERCISE PROGRAM: Access Code: [email or web address] URL: [email or web address] Date: 04/08/2022 Prepared by: Benito Mccreedy   GOALS: Goals reviewed with patient? Yes  SHORT TERM GOALS: Target date: 04/17/22  Pt will demo/state understanding of initial home exercise program (HEP) to improve function, pain, and independence.   Baseline: Needs a plan for  rehabilitation  Goal status: Progressing  2.  Pt will obtain protective, custom or mobilizing orthotic for safety and to improve function.  Baseline: Needs orthotic support  Goal status: TBD (This may be necessary to protect hand, if motion does not improve.)    LONG TERM GOALS: Target date: 05/15/22  Pt will improve functional ability by decreased impairment per Quick DASH assessment to 15% or better, for better quality of life. Baseline: 38.6% Goal status: INITIAL  2.  Pt will improve A/ROM in Rt wrist flex/ext to at least 40* each, to have prerequisite/functional motion for tasks like reach and grasp.  Baseline: 15* / 4* respectively  Goal status: INITIAL  3.  Pt will improve strength in Rt elbow to at least 4+/5 MMT to have increased functional ability to carry out selfcare and higher-level homecare tasks with no difficulty Baseline: 3+/5 MMT Goal status: INITIAL  4.  Pt will improve coordination skills in Rt hand, as seen by increasing score on box and blocks testing to at least 20 blocks to have increased functional ability to carry out fine motor tasks (fasteners, etc.) and more complex, coordinated IADLs (meal prep, sports, etc.).  Baseline: unable to grab a block Goal status: INITIAL  5.   Pt will improve grip strength in effected hand to at least 15# lbs for functional use at home and in IADLs. Baseline: 0 lbs Goal status: INITIAL   ASSESSMENT:  CLINICAL IMPRESSION: 04/13/22: She is learning HEP and stroke symptom management, having most difficulty at forearm and distally.  OT will try E-Stim next session to try to help hand edema and finger motion/activation.  04/08/22: It is concerning that she has such an increase in pain and swelling after only 1 week.  OT was initially concern for a fall or injury, twisted wrist, broken hand, due to the severity of the swelling and pain now.  However OT checked her stability and wrist and fingers and there was no exquisite pain.  OT  will monitor the swelling, but encourages her that if pain and swelling gets worse to either go to the emergency department or follow-up for an x-ray with advanced medical practitioner.  OT also asks her to be aware of her medications in case something there is affecting this.  She does receive dialysis and perhaps this is the cause of the additional swelling as well.  OT will monitor.  Also of note she was very lethargic today, eyes were closing at times.  She states being able to sleep at night however.  PLAN:  OT FREQUENCY: 2x/week  OT DURATION: 6 weeks  PLANNED INTERVENTIONS: self care/ADL training, therapeutic exercise, therapeutic activity, neuromuscular re-education, manual therapy, passive range of motion, splinting, electrical stimulation, ultrasound, compression bandaging, moist heat, cryotherapy, contrast bath, patient/family education, coping strategies training, and DME and/or AE instructions  RECOMMENDED OTHER SERVICES: she is already receiving SLP and PT, perhaps would benefit from nutrition consult, if not already going, for CKD and stroke diet recommendations   CONSULTED AND AGREED WITH PLAN OF CARE: Patient and family member/caregiver  PLAN FOR NEXT SESSION:  Try attended e-stim to facilitate hand motion and reduction in swelling as well as other stroke facilitation techniques.    Benito Mccreedy, OTR/L, CHT 04/13/2022, 4:14 PM

## 2022-04-13 NOTE — Therapy (Unsigned)
OUTPATIENT SPEECH LANGUAGE PATHOLOGY EVALUATION   Patient Name: Monique Henderson MRN: [id number] DOB:08-17-1950, 71 y.o., female Today's Date: 04/14/2022  PCP: Dr. Dorna Mai REFERRING PROVIDER: PA-C Reesa Chew   End of Session - 04/14/22 0840     Visit Number 1    Date for SLP Re-Evaluation 07/07/22    Authorization Type [employer] Medicare    Progress Note Due on Visit 10    SLP Start Time 1014    SLP Stop Time  1052    SLP Time Calculation (min) 38 min    Activity Tolerance Patient tolerated treatment well             Past Medical History:  Diagnosis Date   AF (paroxysmal atrial fibrillation) ([place]) 05/29/2019   on Coumadin   Aortic atherosclerosis ([place]) 07/05/2019   Aortic dissection ([place]) 04/04/2019   s/p repair   Bone spur 2008   Right calcaneal foot spur   Breast cancer ([place]) 2004   Ductal carcinoma in situ of the left breast; S/P left partial mastectomy 02/26/2003; S/P re-excision of cranial and lateral margins11/18/2004.radiation   Cerebral thrombosis with cerebral infarction 05/22/2019   Chronic low back pain 06/22/2016   Chronic obstructive lung disease ([place]) 01/16/2017   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 Autumn Messing; S/P re-excision of superior and inferior margins 10/27/2012.    ESRD on hemodialysis ([place]) 05/29/2019   Essential hypertension 09/16/2006   GERD 09/16/2006   Hepatitis C    treated and RNA confirmed not detectable 01/2017   Insomnia 03/14/2015   Malnutrition of moderate degree 05/19/2019   Non compliance with medical treatment 12/04/2017   Normocytic anemia    With thrombocytosis   Osteoarthritis    Right ureteral stone 2002   S/P lumbar spinal fusion 01/18/2014   S/P lumbar decompressive laminectomy, fusion, and plating for lumbar spinal stenosis on 05/27/2009 by Dr. Eustace Moore.  S/P anterolateral retroperitoneal interbody fusion L2-3 utilizing a 8 mm peek interbody cage  packed with morcellized allograft, and anterior lumbar plating L2-3 for recurrent disc herniation L2-3 with spinal stenosis on 01/18/2014 by Dr. Eustace Moore.     Tobacco use disorder 04/19/2009   Uterine fibroid    Wears dentures    top   Past Surgical History:  Procedure Laterality Date   ANTERIOR LAT LUMBAR FUSION N/A 01/18/2014   Procedure: ANTERIOR LATERAL LUMBAR FUSION LUMBAR TWO-THREE;  Surgeon: Eustace Moore, MD;  Location: [place] [facility];  Service: Neurosurgery;  Laterality: N/A;   ANTERIOR LUMBAR FUSION  01/18/2014   AV FISTULA PLACEMENT Left 04/20/2019   Procedure: ARTERIOVENOUS (AV) FISTULA CREATION;  Surgeon: [name], MD;  Location: [place];  Service: Vascular;  Laterality: Left;   BACK SURGERY     BREAST LUMPECTOMY Left 01/2003   BREAST LUMPECTOMY Right 2014   BREAST LUMPECTOMY WITH NEEDLE LOCALIZATION AND AXILLARY SENTINEL LYMPH NODE BX Right 10/13/2012   Procedure: BREAST LUMPECTOMY WITH NEEDLE LOCALIZATION;  Surgeon: [name], MD;  Location: [facility];  Service: General;  Laterality: Right;  Right breast wire localized lumpectomy   INSERTION OF DIALYSIS CATHETER Right 04/20/2019   Procedure: INSERTION OF DIALYSIS CATHETER, right internal jugular;  Surgeon: [name], MD;  Location: [place] [facility];  Service: Vascular;  Laterality: Right;   INSERTION OF DIALYSIS CATHETER Right 09/24/2020   Procedure: INSERTION OF TUNNELED DIALYSIS CATHETER;  Surgeon: Rosetta Posner, MD;  Location:  [facility];  Service: Vascular;  Laterality: Right;   IR FLUORO GUIDE CV LINE RIGHT  09/22/2020   IR THORACENTESIS ASP PLEURAL SPACE W/IMG GUIDE  05/19/2019   IR US GUIDE VASC ACCESS LEFT  09/22/2020   IR US GUIDE VASC ACCESS RIGHT  09/22/2020   IR VENOCAVAGRAM SVC  09/22/2020   LAMINECTOMY  05/27/2009   Lumbar decompressive laminectomy, fusion and plating for lumbar spinal stensosis   LIGATION OF ARTERIOVENOUS  FISTULA Left 09/24/2020   Procedure: LIGATION OF LEFT  ARM ARTERIOVENOUS  FISTULA;  Surgeon: Rosetta Posner, MD;  Location: [place];  Service: Vascular;  Laterality: Left;   LUMBAR LAMINECTOMY/DECOMPRESSION MICRODISCECTOMY Left 03/23/2013   Procedure: LUMBAR LAMINECTOMY/DECOMPRESSION MICRODISCECTOMY 1 LEVEL;  Surgeon: Eustace Moore, MD;  Location: [place] [facility];  Service: Neurosurgery;  Laterality: Left;  LUMBAR LAMINECTOMY/DECOMPRESSION MICRODISCECTOMY 1 LEVEL   MASTECTOMY, PARTIAL Left 02/26/2003   ; S/P re-excision of cranial and lateral margins 04/19/2003.    RE-EXCISION OF BREAST CANCER,SUPERIOR MARGINS Right 10/27/2012   Procedure: RE-EXCISION OF BREAST CANCER,SUPERIOR and inferior MARGINS;  Surgeon: [name], MD;  Location: [place];  Service: General;  Laterality: Right;   RE-EXCISION OF BREAST LUMPECTOMY Left 04/2003   TEE WITHOUT CARDIOVERSION N/A 04/04/2019   Procedure: Transesophageal Echocardiogram ([place]);  Surgeon: Wonda Olds, MD;  Location: [place];  Service: Open Heart Surgery;  Laterality: N/A;   THORACIC AORTIC ANEURYSM REPAIR N/A 04/04/2019   Procedure: THORACIC ASCENDING ANEURYSM REPAIR (AAA)  USING 28 MM X 30 CM HEMASHIELD PLATINUM VASCULAR GRAFT;  Surgeon: Wonda Olds, MD;  Location: [facility];  Service: Open Heart Surgery;  Laterality: N/A;   Patient Active Problem List   Diagnosis Date Noted   Right hemiparesis ([place]) 04/06/2022   Acute cardioembolic stroke ([place]) 11/94/1740   Stroke (cerebrum) ([place]) 02/23/2022   Stroke ([place]) 02/23/2022   Hypervolemia associated with renal insufficiency 01/29/2022   Dyslipidemia 01/29/2022   DNR (do not resuscitate) 01/29/2022   Polysubstance abuse ([place]) 01/06/2022   Acute on chronic combined systolic and diastolic CHF (congestive heart failure) ([place]) 01/06/2022   Abdominal pain 81/44/8185   Acute metabolic encephalopathy 63/14/9702   Malignant HTN with heart disease, w/o CHF, 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 12/24/2021   History of hepatitis C 12/08/2021   Hypertensive urgency 10/29/2021    Fluid overload 10/29/2021   Acute on chronic HFrEF (heart failure with reduced ejection fraction) ([place]) 10/29/2021   Chronic anticoagulation 10/29/2021   Prolonged QT interval 10/29/2021   Leukopenia 10/29/2021   SOB (shortness of breath) 09/15/2021   Hypertensive emergency 08/23/2021   Altered mental status    ESRD (end stage renal disease) on dialysis with hyperkalemia and metabolic acidosis with elevated AG  08/12/2021   Malignant hypertension 06/19/2021   Anemia 06/05/2021   Pure hypercholesterolemia 02/14/2021   Acute pulmonary edema ([place]) 12/23/2020   Acute respiratory failure with hypoxia ([place]) 09/21/2020   Acute renal failure superimposed on chronic kidney disease ([place]) 09/21/2020   Community acquired pneumonia 09/21/2020   Uremia 63/78/5885   Metabolic acidosis with increased anion gap and accumulation of organic acids 09/21/2020   Hypertensive crisis 09/21/2020   Troponin level elevated 08/28/2019   Positive D dimer 08/28/2019   Hyperkalemia 08/28/2019   SVT (supraventricular tachycardia) 08/28/2019   SIRS (systemic inflammatory response syndrome) ([place]) 08/27/2019   Paroxysmal SVT (supraventricular tachycardia) 08/27/2019   Aortic atherosclerosis ([place]) 07/05/2019   Chest pain 07/05/2019   History of CVA (cerebrovascular accident) 05/29/2019   AF (  paroxysmal atrial fibrillation) ([place]) 05/29/2019   ESRD on dialysis ([place]) 05/29/2019   Cerebral thrombosis with cerebral infarction 05/22/2019   Malnutrition of moderate degree 05/19/2019   Pleural effusion 05/18/2019   Atrial fibrillation with RVR ([place]) 05/18/2019   S/P aortic aneurysm repair 04/07/2019   Aortic dissection ([place]) 04/04/2019   Dissection of aorta ([place]) 04/03/2019   Non compliance with medical treatment 12/04/2017   Chronic obstructive lung disease ([place]) 01/16/2017   Chronic low back pain 06/22/2016   Insomnia 03/14/2015   S/P lumbar spinal fusion 01/18/2014   Tobacco use disorder 04/19/2009   GERD  09/16/2006    ONSET DATE: 02/23/2022   REFERRING DIAG: I63.9 (ICD-10-CM) - Cerebral infarction, unspecified   THERAPY DIAG:  Dysarthria and anarthria  Rationale for Evaluation and Treatment: Rehabilitation  SUBJECTIVE:   SUBJECTIVE STATEMENT: "If I speak slow people can understand me better" Pt accompanied by: family member (sister)  PERTINENT HISTORY:  sudden onset of facial droop, slurred speech and right-sided weakness on the morning of 02/22/2022.  She presented to [facility] emergency department via EMS 9/25 and code stroke was activated. Cup included MRI of the brain which revealed acute/subacute cortical infarct of the left posterior frontal and anterior parietal lobes including the central sulcus and additional focal acute/subacute infarct involving the caudate head. She has a history of paroxysmal atrial fibrillation on Eliquis. She is tolerating dysphagia 2 diet with thin liquids. Received ST services in CIR from 02/27/22 to 03/07/2022. Currently seeing PT/OT at [facility].   PAIN:  Are you having pain? No  FALLS: Has patient fallen in last 6 months?  See PT evaluation for details  LIVING ENVIRONMENT: Lives with: lives alone Lives in: House/apartment  PLOF:  Level of assistance: Independent Employment: Retired  PATIENT GOALS: "easier to talk"  OBJECTIVE:   DIAGNOSTIC FINDINGS:  02/25/22 MR BRAIN IMPRESSION: Redemonstrated acute infarcts in the left cerebral hemisphere, with a few punctate infarcts in the left frontal and temporal lobes likely present on the prior exam in retrospect but now increased in conspicuity. No definite new acute infarct. No evidence of hemorrhagic transformation, mass effect, or midline shift.  02/25/22 MBSS minimal oropharyngeal dysphagia marked by swallow initiation delay to the pyriform sinuses, decreased bolus cohesion, and right anterior bolus loss. Pt displayed swallow initiation delay with thin liquids and pill administration (whole  in puree). Right anterior loss, anterior sulcus pocketing, and decreased bolus cohesion noted with thin liquids. Hyolaryngeal excursion, pharyngeal contractions and epiglottic inversion adequate for airway protection. No airway intrusion noted throughout study. Despite noted right sided facial weakness, no buccal pocketing of bolus present. Pt edentulous, however, mastication of regular consistency presents as functional. Per chart review, RN reports pt had worsening dysphagia overnight (9/26) marked by increased coughing possibly due to lethargic state. Recommend pt start thin liquid/dys 2 (chopped/minced) diet. Full supervision to monitor alertness, ensure pt upright while eating and crush pills.   COGNITION: Overall cognitive status: Within functional limits for tasks assessed Areas of impairment:  None reported, confirmed by sister Functional deficits: is getting some A for ADLs/iADLs, primarily d/t physical limitations per pt and sister  COGNITIVE COMMUNICATION: Following directions: Follows multi-step commands consistently  Auditory comprehension: WFL Verbal expression: WFL Functional communication: WFL  MOTOR SPEECH  Overall motor speech: impaired Level of impairment: Word Rate of Speech: variable, pt attempting to employ slow rate to maximize intelligibility  Dysfluencies: none evidenced Vocal Loudness: low vocal intensity (averages 64 dB during 5 minute conversational sample)  Voice  Quality: clear sounding voice Respiration: thoracic breathing Word and Phrasal Stress: WFL Resonance: WFL Articulation: imprecise consonants, particularly notable for plosives and consonant clusters Disdochokinetic Rate (DDK): Intelligibility: informally judged to be 80% intelligible in conversation. Improved clarity at word and phrase level.  Motor planning: Appears intact Interfering components: R side weakness, limited ROM Effective technique: slow rate, increased vocal intensity, and over  articulate  ORAL MOTOR EXAMINATION: Overall status: Impaired Labial: asymmetrical movement, reduced on R Lingual: fasciculations upon protrusion, not observed at rest; reduced ROM to L; deviates to R upon protrusion Comments: facial drooping on lower R side of face  STANDARDIZED ASSESSMENTS: Deferred, primary c/o motor speech, evaluated using [place] Framework  PATIENT REPORTED OUTCOME MEASURES (PROM): Communication Effectiveness Survey: 21 1: conversing with stranger on phone 2: conversing in noisy environment, across distance  TODAY'S TREATMENT: 04-13-22: SLP provided education on evaluation observations and recommendations for therapy course. Initiated training re: dysarthria compensations and strategies to aid in improved communication efficacy. Collaborated with pt to establish POC and goals. Pt in agreement with POC and denies questions at conclusion of evaluation.    PATIENT EDUCATION: Education details: see above Person educated: Patient and sister Education method: Customer service manager Education comprehension: verbalized understanding, returned demonstration, and needs further education   GOALS: Goals reviewed with patient? Yes  SHORT TERM GOALS: Target date: 05/05/2022  Pt will intelligibly produce multi-syllabic words containing blends at generative sentence level in 18/20 trials with rare min-A Baseline: Goal status: INITIAL  2.  Pt will teach back strategies and compensations to aid in communication efficacy with various communication partners  Baseline:  Goal status: INITIAL  3.  Pt will maintain 68+ dB average during 5 minute conversation with occasional mod-A over 2 sessions Baseline:  Goal status: INITIAL  4.  Pt will read 1 paragraph with use of compensations PRN, demonstrating carryover of dysarthria strategies with rare min-A Baseline:  Goal status: INITIAL   LONG TERM GOALS: Target date: 07/07/2022  Pt will report BID  dysarthria home practice 5/7 days over 1 week period with mod-I Baseline:  Goal status: INITIAL  2.  Pt will maintain 68+ dB average over 20 minute conversation with occasional min-A Baseline:  Goal status: INITIAL  3.  Pt will complete mult-paragraph oral reading, demonstrating carryover of dysarthria strategies with rare min-A Baseline:  Goal status: INITIAL  4.  Pt will report improve subjective perception of speech efficacy via patient reported outcome measure by d/c  Baseline: 21 Goal status: INITIAL   ASSESSMENT:  CLINICAL IMPRESSION: Patient is a 71 y.o. F presenting with moderate dysarthria. Intelligibility in conversation is reduced to approximately 80%, with frequent requests for repetition required from SLP. Language and cognitive skills appears to be within gross functional limits. Pt reports functional swallow with pt having returned to eating regular diet at home. Pt demonstrating understanding of using reduced rate to aid in intelligibility but is showing limited carryover of strategy to conversational speech. Skilled ST is indicated to maximize carryover of strategies to improve pt's communication efficacy.   OBJECTIVE IMPAIRMENTS: include dysarthria. These impairments are limiting patient from effectively communicating at home and in community. Factors affecting potential to achieve goals and functional outcome are severity of impairments. Patient will benefit from skilled SLP services to address above impairments and improve overall function.  REHAB POTENTIAL: Good  PLAN:  SLP FREQUENCY: 1x/week  SLP DURATION: 12 weeks  PLANNED INTERVENTIONS: Language facilitation, Cueing hierachy, Internal/external aids, Functional tasks, Multimodal communication approach, SLP instruction  and feedback, Compensatory strategies, and Patient/family education    Su Monks, [place] 04/14/2022, 8:40 AM

## 2022-04-14 NOTE — Therapy (Signed)
OUTPATIENT OCCUPATIONAL THERAPY TREATMENT NOTE  Patient Name: Monique Henderson MRN: [id number] DOB:September 12, 1950, 71 y.o., female Today's Date: 04/15/2022  PCP: N/A REFERRING PROVIDER: Bary Leriche, PA-C    OT End of Session - 04/15/22 1520     Visit Number 4    Number of Visits 12    Date for OT Re-Evaluation 05/15/22    Authorization Type [employer] Medicare    Progress Note Due on Visit 10    OT Start Time 1521    OT Stop Time 1552    OT Time Calculation (min) 31 min    Equipment Utilized During Treatment --    Activity Tolerance Patient tolerated treatment well;No increased pain;Patient limited by pain;Patient limited by fatigue    Behavior During Therapy [facility] for tasks assessed/performed              Past Medical History:  Diagnosis Date   AF (paroxysmal atrial fibrillation) ([place]) 05/29/2019   on Coumadin   Aortic atherosclerosis ([place]) 07/05/2019   Aortic dissection ([place]) 04/04/2019   s/p repair   Bone spur 2008   Right calcaneal foot spur   Breast cancer ([place]) 2004   Ductal carcinoma in situ of the left breast; S/P left partial mastectomy 02/26/2003; S/P re-excision of cranial and lateral margins11/18/2004.radiation   Cerebral thrombosis with cerebral infarction 05/22/2019   Chronic low back pain 06/22/2016   Chronic obstructive lung disease ([place]) 01/16/2017   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 Autumn Messing; S/P re-excision of superior and inferior margins 10/27/2012.    ESRD on hemodialysis ([place]) 05/29/2019   Essential hypertension 09/16/2006   GERD 09/16/2006   Hepatitis C    treated and RNA confirmed not detectable 01/2017   Insomnia 03/14/2015   Malnutrition of moderate degree 05/19/2019   Non compliance with medical treatment 12/04/2017   Normocytic anemia    With thrombocytosis   Osteoarthritis    Right ureteral stone 2002   S/P lumbar spinal fusion 01/18/2014   S/P lumbar decompressive  laminectomy, fusion, and plating for lumbar spinal stenosis on 05/27/2009 by Dr. Eustace .  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 Eustace .     Tobacco use disorder 04/19/2009   Uterine fibroid    Wears dentures    top   Past Surgical History:  Procedure Laterality Date   ANTERIOR LAT LUMBAR FUSION N/A 01/18/2014   Procedure: ANTERIOR LATERAL LUMBAR FUSION LUMBAR TWO-THREE;  Surgeon: Eustace , MD;  Location: [place] [facility];  Service: Neurosurgery;  Laterality: N/A;   ANTERIOR LUMBAR FUSION  01/18/2014   AV FISTULA PLACEMENT Left 04/20/2019   Procedure: ARTERIOVENOUS (AV) FISTULA CREATION;  Surgeon: [name], MD;  Location: [place];  Service: Vascular;  Laterality: Left;   BACK SURGERY     BREAST LUMPECTOMY Left 01/2003   BREAST LUMPECTOMY Right 2014   BREAST LUMPECTOMY WITH NEEDLE LOCALIZATION AND AXILLARY SENTINEL LYMPH NODE BX Right 10/13/2012   Procedure: BREAST LUMPECTOMY WITH NEEDLE LOCALIZATION;  Surgeon: [name], MD;  Location: [place];  Service: General;  Laterality: Right;  Right breast wire localized lumpectomy   INSERTION OF DIALYSIS CATHETER Right 04/20/2019   Procedure: INSERTION OF DIALYSIS CATHETER, right internal jugular;  Surgeon: [name], MD;  [facility];  Service: Vascular;  Laterality: Right;   INSERTION OF DIALYSIS CATHETER Right 09/24/2020   Procedure: INSERTION OF TUNNELED DIALYSIS CATHETER;  Surgeon: Rosetta Posner, MD;  Location: [place];  Service: Vascular;  Laterality: Right;   IR FLUORO GUIDE CV LINE RIGHT  09/22/2020   IR THORACENTESIS ASP PLEURAL SPACE W/IMG GUIDE  05/19/2019   IR US GUIDE VASC ACCESS LEFT  09/22/2020   IR US GUIDE VASC ACCESS RIGHT  09/22/2020   IR VENOCAVAGRAM SVC  09/22/2020    LAMINECTOMY  05/27/2009   Lumbar decompressive laminectomy, fusion and plating for lumbar spinal stensosis   LIGATION OF ARTERIOVENOUS  FISTULA Left 09/24/2020   Procedure: LIGATION OF LEFT ARM ARTERIOVENOUS  FISTULA;  Surgeon: Rosetta Posner, MD;  Location: [place];  Service: Vascular;  Laterality: Left;   LUMBAR LAMINECTOMY/DECOMPRESSION MICRODISCECTOMY Left 03/23/2013   Procedure: LUMBAR LAMINECTOMY/DECOMPRESSION MICRODISCECTOMY 1 LEVEL;  Surgeon: Eustace , MD;  Location: [place] [facility];  Service: Neurosurgery;  Laterality: Left;  LUMBAR LAMINECTOMY/DECOMPRESSION MICRODISCECTOMY 1 LEVEL   MASTECTOMY, PARTIAL Left 02/26/2003   ; S/P re-excision of cranial and lateral margins 04/19/2003.    RE-EXCISION OF BREAST CANCER,SUPERIOR MARGINS Right 10/27/2012   Procedure: RE-EXCISION OF BREAST CANCER,SUPERIOR and inferior MARGINS;  Surgeon: [name], MD;  Location: [place];  Service: General;  Laterality: Right;   RE-EXCISION OF BREAST LUMPECTOMY Left 04/2003   TEE WITHOUT CARDIOVERSION N/A 04/04/2019   Procedure: Transesophageal Echocardiogram ([place]);  Surgeon: Wonda Olds, MD;  Location: [place];  Service: Open Heart Surgery;  Laterality: N/A;   THORACIC AORTIC ANEURYSM REPAIR N/A 04/04/2019   Procedure: THORACIC ASCENDING ANEURYSM REPAIR (AAA)  USING 28 MM X 30 CM HEMASHIELD PLATINUM VASCULAR GRAFT;  Surgeon: Wonda Olds, MD;  [facility];  Service: Open Heart Surgery;  Laterality: N/A;   Patient Active Problem List   Diagnosis Date Noted   Right hemiparesis ([place]) 04/06/2022   Acute cardioembolic stroke ([place]) 70/96/2836   Stroke (cerebrum) ([place]) 02/23/2022   Stroke ([place]) 02/23/2022   Hypervolemia associated with renal insufficiency 01/29/2022   Dyslipidemia 01/29/2022   DNR (do not resuscitate) 01/29/2022   Polysubstance abuse ([place]) 01/06/2022   Acute on chronic combined systolic and diastolic CHF (congestive heart failure) ([place]) 01/06/2022   Abdominal pain 62/94/7654   Acute  metabolic encephalopathy 65/07/5463   Malignant HTN with heart disease, w/o CHF, 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 12/24/2021   History of hepatitis C 12/08/2021   Hypertensive urgency 10/29/2021   Fluid overload 10/29/2021   Acute on chronic HFrEF (heart failure with reduced ejection fraction) ([place]) 10/29/2021   Chronic anticoagulation 10/29/2021   Prolonged QT interval 10/29/2021   Leukopenia 10/29/2021   SOB (shortness of breath) 09/15/2021   Hypertensive emergency 08/23/2021   Altered mental status    ESRD (end stage renal disease) on dialysis with hyperkalemia and metabolic acidosis with elevated AG  08/12/2021   Malignant hypertension 06/19/2021   Anemia 06/05/2021   Pure hypercholesterolemia 02/14/2021   Acute pulmonary edema ([place]) 12/23/2020   Acute respiratory failure with hypoxia ([place]) 09/21/2020   Acute renal failure superimposed on chronic kidney disease ([place]) 09/21/2020   Community acquired pneumonia 09/21/2020   Uremia 68/05/7516   Metabolic acidosis with increased anion gap and accumulation of organic acids 09/21/2020   Hypertensive crisis 09/21/2020   Troponin level elevated 08/28/2019   Positive D dimer 08/28/2019   Hyperkalemia 08/28/2019   SVT (supraventricular tachycardia) 08/28/2019   SIRS (systemic inflammatory  response syndrome) ([place]) 08/27/2019   Paroxysmal SVT (supraventricular tachycardia) 08/27/2019   Aortic atherosclerosis ([place]) 07/05/2019   Chest pain 07/05/2019   History of CVA (cerebrovascular accident) 05/29/2019   AF (paroxysmal atrial fibrillation) ([place]) 05/29/2019   ESRD on dialysis ([place]) 05/29/2019   Cerebral thrombosis with cerebral infarction 05/22/2019   Malnutrition of moderate degree 05/19/2019   Pleural effusion 05/18/2019   Atrial fibrillation with RVR ([place]) 05/18/2019   S/P aortic aneurysm repair 04/07/2019   Aortic dissection ([place]) 04/04/2019   Dissection of aorta ([place]) 04/03/2019   Non compliance with medical treatment 12/04/2017    Chronic obstructive lung disease ([place]) 01/16/2017   Chronic low back pain 06/22/2016   Insomnia 03/14/2015   S/P lumbar spinal fusion 01/18/2014   Tobacco use disorder 04/19/2009   GERD 09/16/2006    ONSET DATE:  DOI: 02/22/22 (CVA)  REFERRING DIAG: I63.9 (ICD-10-CM) - Cerebral infarction, unspecified  THERAPY DIAG:  Localized edema  Other lack of coordination  Muscle weakness (generalized)  Stiffness of right wrist, not elsewhere classified  Stiffness of right hand, not elsewhere classified  Rationale for Evaluation and Treatment: Rehabilitation  PERTINENT HISTORY: She suffered a stroke on 02/22/22 to her Lt posterior-frontal parietal and anterior-parietal areas causing dysphagia, right hemiplegia, speech issues, and cognitive problems. She spent ~2 weeks in the hospital, in inpatient rehab. At discharge from inpatient rehab, she was noted to have: no right handed grip, 75% intelligible speech, SBA with no device for transfers, dysphagia, HTN, HLD, tachycardia, A-fib, CKD, and chest pain (other than cardiac).   PRECAUTIONS: Fall and Other: Rt hemiplegia, speech, swallow and cognitive issues  WEIGHT BEARING RESTRICTIONS: No   SUBJECTIVE:   SUBJECTIVE STATEMENT: She states having dialysis yesterday.  She is not itchy but claims she cannot wear her edema control sleeve because that feels itchy.  She is more alert today after dialysis yesterday.   PAIN:  Are you having pain? No Rating: 0/10 at rest now    PATIENT GOALS: To get right dom arm moving better.    OBJECTIVE: (All objective assessments below are from initial evaluation on: 04/01/22 unless otherwise specified.)    HAND DOMINANCE: Right   ADLs: Overall ADLs: States decreased ability to grab, hold household objects, pain and inability to open containers, perform FMS tasks (manipulate fasteners on clothing), mild to moderate bathing problems as well.    FUNCTIONAL OUTCOME MEASURES: Eval: [place] DASH 38.6%  impairment today  (Higher % Score  =  More Impairment)     UPPER EXTREMITY ROM     Shoulder to Wrist AROM Right eval Rt  04/08/22  Shoulder flexion 108* in scaption 118* in scaption  Shoulder abduction 128* 107*  Shoulder extension 68   Shoulder internal rotation    Shoulder external rotation    Elbow flexion 130 138*  Elbow extension (-10) 0*  Forearm supination 65 51*  Forearm pronation  65 60*  Wrist flexion 15   Wrist extension 4   Wrist ulnar deviation 19   Wrist radial deviation 5   (Blank rows = not tested)  Eval: She has some (~10-15*) total motion in composite finger flexion and extension today with prolonged squeezes. Thumb motion is nil.   Hand AROM Right eval  Full Fist Ability (or Gap to Distal Palmar Crease) 5.5cm gap from MF tip to [facility]  Thumb Opposition to Small Finger (or Gap) unable  Thumb Opposition to Base of Small Finger (or Gap)  unable  (Blank rows = not tested)  UPPER EXTREMITY MMT:    Eval:  Right Arm: Grossly 3+/5 at shoulder, 3+/5 at elbow, 3-/5 at forearm, 2-/5 at wrist and 1+/5 in fingers  MMT Right TBD  Shoulder flexion   Shoulder abduction   Shoulder adduction   Shoulder extension   Shoulder internal rotation   Shoulder external rotation   Middle trapezius   Lower trapezius   Elbow flexion   Elbow extension   Forearm supination   Forearm pronation   Wrist flexion   Wrist extension   Wrist ulnar deviation   Wrist radial deviation   (Blank rows = not tested)  HAND FUNCTION: Eval: Observed weakness in affected hand.  Grip strength Right: 0 lbs, Left: 26 lbs   COORDINATION: Eval: Observed coordination impairments with affected rt hand. Box and Blocks Test: Right unable to grasp a block today (TBD is [facility])  SENSATION: Eval:  Light touch intact today, equal b/l per pt  EDEMA:   04/15/22: 46.7cm today at start (yesterday had dialysis and it obviously made it worse). (45cm after facilitated e-stim.)   04/13/22: 45cm figure of 8  around Rt hand to start    COGNITION: Eval: Overall cognitive status: overtly seemed [facility] for evaluation today, though perhaps a bit impulsive.  She is working with SLP for this and speech  OBSERVATIONS:   Eval: She has fairly good motion at shoulder and elbow, speech can be difficult to understand   TODAY'S TREATMENT:  04/15/22: Use attended e-stim to facilitate tenodesis motion patterns with e-stim facilitating finger flexors. She did not tolerate for extensors today, due to pain c/o, but no pain in flexor group (Russian, single channel, 10/10 ratio, 50% cycle, 2 sec ramp, 50bps, up to 72m at times). Not redness or pain after. OT also uses hand-over-hand to facilitate PNF patterns flexion/ext with her.  OT also quickly reviews the rest of HEP with her verbally and with images.   She states her stomach has crams and she does not want to do supine exercises today. She requests to leave a little early as her ride needs to leave early.    PATIENT EDUCATION: Education details: See tx section above for details  Person educated: Patient Education method: Verbal Instruction, Teach back, Handouts  Education comprehension: States and demonstrates understanding, Additional Education required    HOME EXERCISE PROGRAM: Access Code: [email or web address] URL: [email or web address] Date: 04/08/2022 Prepared by: NBenito Mccreedy  GOALS: Goals reviewed with patient? Yes  SHORT TERM GOALS: Target date: 04/17/22  Pt will demo/state understanding of initial home exercise program (HEP) to improve function, pain, and independence.   Baseline: Needs a plan for rehabilitation  Goal status: MET for basic HEP now.   2.  Pt will obtain protective, custom or mobilizing orthotic for safety and to improve function.  Baseline: Needs orthotic support  Goal status: TBD (This may be necessary to protect hand, if motion does not improve.)    LONG TERM GOALS: Target date: 05/15/22  Pt will improve  functional ability by decreased impairment per Quick DASH assessment to 15% or better, for better quality of life. Baseline: 38.6% Goal status: INITIAL  2.  Pt will improve A/ROM in Rt wrist flex/ext to at least 40* each, to have prerequisite/functional motion for tasks like reach and grasp.  Baseline: 15* / 4* respectively  Goal status: INITIAL  3.  Pt will improve strength in Rt elbow to at least 4+/5 MMT to have increased functional ability to carry out selfcare and higher-level homecare tasks with no  difficulty Baseline: 3+/5 MMT Goal status: INITIAL  4.  Pt will improve coordination skills in Rt hand, as seen by increasing score on box and blocks testing to at least 20 blocks to have increased functional ability to carry out fine motor tasks (fasteners, etc.) and more complex, coordinated IADLs (meal prep, sports, etc.).  Baseline: unable to grab a block Goal status: INITIAL  5.   Pt will improve grip strength in effected hand to at least 15# lbs for functional use at home and in IADLs. Baseline: 0 lbs Goal status: INITIAL   ASSESSMENT:  CLINICAL IMPRESSION: 04/15/22: Electric stimulation for neuromuscular facilitation is very effective today to achieve finger flexion and facilitated tenodesis patterns.  It also serves to help decrease edema which comes down about 1.5 cm after this is performed.  OT uses this is an example for why she should keep her hand moving actively and passively as much as possible.  Edema is a major issue, exacerbated by dialysis, that is going to to limit her prognosis and return of function in the hand likely, unfortunately.  Increased swelling in her hand is also wearing her arm down causing her shoulder to be a bit more stiff and sore.  She has not been wearing her edema control elastic tubular gauze that was given to her.  Continue plan of care   PLAN:  OT FREQUENCY: 2x/week  OT DURATION: 6 weeks  PLANNED INTERVENTIONS: self care/ADL training,  therapeutic exercise, therapeutic activity, neuromuscular re-education, manual therapy, passive range of motion, splinting, electrical stimulation, ultrasound, compression bandaging, moist heat, cryotherapy, contrast bath, patient/family education, coping strategies training, and DME and/or AE instructions  RECOMMENDED OTHER SERVICES: she is already receiving SLP and PT, perhaps would benefit from nutrition consult, if not already going, for CKD and stroke diet recommendations   CONSULTED AND AGREED WITH PLAN OF CARE: Patient and family member/caregiver  PLAN FOR NEXT SESSION:  Can again do [place] e-stim or supine developmental patterns if she can tolerate these.  Continue to work on passive and active range of motion mainly distally and swelling control.  Assign tolerable shoulder stretches for building shoulder tightness.   Benito Mccreedy, OTR/L, CHT 04/15/2022, 4:04 PM

## 2022-04-15 ENCOUNTER — Ambulatory Visit: Payer: Medicare Other | Admitting: Rehabilitative and Restorative Service Providers"

## 2022-04-15 ENCOUNTER — Encounter: Payer: Self-pay | Admitting: Rehabilitative and Restorative Service Providers"

## 2022-04-15 ENCOUNTER — Ambulatory Visit: Payer: Medicare Other | Admitting: Physical Therapy

## 2022-04-15 DIAGNOSIS — R600 Localized edema: Secondary | ICD-10-CM

## 2022-04-15 DIAGNOSIS — M25631 Stiffness of right wrist, not elsewhere classified: Secondary | ICD-10-CM

## 2022-04-15 DIAGNOSIS — I639 Cerebral infarction, unspecified: Secondary | ICD-10-CM | POA: Diagnosis not present

## 2022-04-15 DIAGNOSIS — M6281 Muscle weakness (generalized): Secondary | ICD-10-CM

## 2022-04-15 DIAGNOSIS — R278 Other lack of coordination: Secondary | ICD-10-CM

## 2022-04-15 DIAGNOSIS — M25641 Stiffness of right hand, not elsewhere classified: Secondary | ICD-10-CM

## 2022-04-17 ENCOUNTER — Other Ambulatory Visit: Payer: Self-pay | Admitting: Physician Assistant

## 2022-04-17 NOTE — Telephone Encounter (Signed)
noted 

## 2022-04-17 NOTE — Telephone Encounter (Signed)
Copied from [place] [id number]. Topic: General - Inquiry >> Apr 16, 2022  4:24 PM Erskine Squibb wrote: Reason for CRM: Monique Henderson with [employer] called in stating the son of the patient has refused the shower chair because he wanted one with arms and they do not do that only chairs with backs or no back. Please assist further

## 2022-04-20 ENCOUNTER — Ambulatory Visit: Payer: Medicare Other | Admitting: Speech Pathology

## 2022-04-20 ENCOUNTER — Ambulatory Visit (INDEPENDENT_AMBULATORY_CARE_PROVIDER_SITE_OTHER): Payer: Medicare Other | Admitting: Family Medicine

## 2022-04-20 ENCOUNTER — Other Ambulatory Visit: Payer: Self-pay | Admitting: Family Medicine

## 2022-04-20 VITALS — BP 162/85 | HR 80 | Temp 98.1°F | Resp 16 | Wt 108.0 lb

## 2022-04-20 DIAGNOSIS — N186 End stage renal disease: Secondary | ICD-10-CM | POA: Diagnosis not present

## 2022-04-20 DIAGNOSIS — G4700 Insomnia, unspecified: Secondary | ICD-10-CM

## 2022-04-20 DIAGNOSIS — F419 Anxiety disorder, unspecified: Secondary | ICD-10-CM | POA: Diagnosis not present

## 2022-04-20 DIAGNOSIS — R41841 Cognitive communication deficit: Secondary | ICD-10-CM

## 2022-04-20 DIAGNOSIS — F32A Depression, unspecified: Secondary | ICD-10-CM | POA: Diagnosis not present

## 2022-04-20 DIAGNOSIS — I10 Essential (primary) hypertension: Secondary | ICD-10-CM

## 2022-04-20 DIAGNOSIS — Z1231 Encounter for screening mammogram for malignant neoplasm of breast: Secondary | ICD-10-CM

## 2022-04-20 DIAGNOSIS — I639 Cerebral infarction, unspecified: Secondary | ICD-10-CM | POA: Diagnosis not present

## 2022-04-20 MED ORDER — ISOSORBIDE MONONITRATE ER 30 MG PO TB24: 30.0000 mg | ORAL_TABLET | Freq: Every day | ORAL | 1 refills | Status: DC

## 2022-04-20 MED ORDER — ROSUVASTATIN CALCIUM 10 MG PO TABS: 10.0000 mg | ORAL_TABLET | Freq: Every day | ORAL | 0 refills | Status: DC

## 2022-04-20 MED ORDER — TRAZODONE HCL 50 MG PO TABS: 25.0000 mg | ORAL_TABLET | Freq: Every evening | ORAL | 0 refills | Status: DC | PRN

## 2022-04-20 NOTE — Therapy (Signed)
OUTPATIENT SPEECH LANGUAGE PATHOLOGY EVALUATION   Patient Name: Monique [facility] MRN: [id number] DOB:[birth year], 71 y.o., female Today's Date: 04/20/2022  PCP: Dr. Dorna Mai REFERRING PROVIDER: PA-C Reesa Chew   End of Session - 04/20/22 1104     Visit Number 2    Date for SLP Re-Evaluation 07/07/22    Authorization Type [employer] Medicare    Progress Note Due on Visit 10    SLP Start Time 1104    SLP Stop Time  1143    SLP Time Calculation (min) 39 min    Activity Tolerance Patient tolerated treatment well              Past Medical History:  Diagnosis Date   AF (paroxysmal atrial fibrillation) ([place]) 05/29/2019   on Coumadin   Aortic atherosclerosis ([place]) 07/05/2019   Aortic dissection ([place]) 04/04/2019   s/p repair   Bone spur 2008   Right calcaneal foot spur   Breast cancer ([place]) 2004   Ductal carcinoma in situ of the left breast; S/P left partial mastectomy 02/26/2003; S/P re-excision of cranial and lateral margins11/18/2004.radiation   Cerebral thrombosis with cerebral infarction 05/22/2019   Chronic low back pain 06/22/2016   Chronic obstructive lung disease ([place]) 01/16/2017   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 Autumn Messing; S/P re-excision of superior and inferior margins 10/27/2012.    ESRD on hemodialysis ([place]) 05/29/2019   Essential hypertension 09/16/2006   GERD 09/16/2006   Hepatitis C    treated and RNA confirmed not detectable 01/2017   Insomnia 03/14/2015   Malnutrition of moderate degree 05/19/2019   Non compliance with medical treatment 12/04/2017   Normocytic anemia    With thrombocytosis   Osteoarthritis    Right ureteral stone 2002   S/P lumbar spinal fusion 01/18/2014   S/P lumbar decompressive laminectomy, fusion, and plating for lumbar spinal stenosis on 05/27/2009 by Dr. Eustace Moore.  S/P anterolateral retroperitoneal interbody fusion L2-3 utilizing a 8 mm peek interbody cage  packed with morcellized allograft, and anterior lumbar plating L2-3 for recurrent disc herniation L2-3 with spinal stenosis on 01/18/2014 by Dr. Eustace Moore.     Tobacco use disorder 04/19/2009   Uterine fibroid    Wears dentures    top   Past Surgical History:  Procedure Laterality Date   ANTERIOR LAT LUMBAR FUSION N/A 01/18/2014   Procedure: ANTERIOR LATERAL LUMBAR FUSION LUMBAR TWO-THREE;  Surgeon: Eustace Moore, MD;  Location: [facility];  Service: Neurosurgery;  Laterality: N/A;   ANTERIOR LUMBAR FUSION  01/18/2014   AV FISTULA PLACEMENT Left 04/20/2019   Procedure: ARTERIOVENOUS (AV) FISTULA CREATION;  Surgeon: [name], MD;  Location: [facility];  Service: Vascular;  Laterality: Left;   BACK SURGERY     BREAST LUMPECTOMY Left 01/2003   BREAST LUMPECTOMY Right 2014   BREAST LUMPECTOMY WITH NEEDLE LOCALIZATION AND AXILLARY SENTINEL LYMPH NODE BX Right 10/13/2012   Procedure: BREAST LUMPECTOMY WITH NEEDLE LOCALIZATION;  Surgeon: [name], MD;  Location: [facility];  Service: General;  Laterality: Right;  Right breast wire localized lumpectomy   INSERTION OF DIALYSIS CATHETER Right 04/20/2019   Procedure: INSERTION OF DIALYSIS CATHETER, right internal jugular;  Surgeon: [name], MD;  Location: [facility];  Service: Vascular;  Laterality: Right;   INSERTION OF DIALYSIS CATHETER Right 09/24/2020   Procedure: INSERTION OF TUNNELED DIALYSIS CATHETER;  Surgeon: Rosetta Posner, MD;  [facility];  Service: Vascular;  Laterality: Right;   IR FLUORO GUIDE CV LINE RIGHT  09/22/2020   IR THORACENTESIS ASP PLEURAL SPACE W/IMG GUIDE  05/19/2019   IR US GUIDE VASC ACCESS LEFT  09/22/2020   IR US GUIDE VASC ACCESS RIGHT  09/22/2020   IR VENOCAVAGRAM SVC  09/22/2020   LAMINECTOMY  05/27/2009   Lumbar decompressive laminectomy, fusion and plating for lumbar spinal stensosis   LIGATION OF ARTERIOVENOUS  FISTULA Left 09/24/2020   Procedure: LIGATION OF LEFT  ARM ARTERIOVENOUS  FISTULA;  Surgeon: Rosetta Posner, MD;  Location: [facility];  Service: Vascular;  Laterality: Left;   LUMBAR LAMINECTOMY/DECOMPRESSION MICRODISCECTOMY Left 03/23/2013   Procedure: LUMBAR LAMINECTOMY/DECOMPRESSION MICRODISCECTOMY 1 LEVEL;  Surgeon: Eustace Moore, MD;  Location: [facility];  Service: Neurosurgery;  Laterality: Left;  LUMBAR LAMINECTOMY/DECOMPRESSION MICRODISCECTOMY 1 LEVEL   MASTECTOMY, PARTIAL Left 02/26/2003   ; S/P re-excision of cranial and lateral margins 04/19/2003.    RE-EXCISION OF BREAST CANCER,SUPERIOR MARGINS Right 10/27/2012   Procedure: RE-EXCISION OF BREAST CANCER,SUPERIOR and inferior MARGINS;  Surgeon: [name], MD;  Location: [facility];  Service: General;  Laterality: Right;   RE-EXCISION OF BREAST LUMPECTOMY Left 04/2003   TEE WITHOUT CARDIOVERSION N/A 04/04/2019   Procedure: Transesophageal Echocardiogram (Tee);  Surgeon: Wonda Olds, MD;  Location: [facility];  Service: Open Heart Surgery;  Laterality: N/A;   THORACIC AORTIC ANEURYSM REPAIR N/A 04/04/2019   Procedure: THORACIC ASCENDING ANEURYSM REPAIR (AAA)  USING 28 MM X 30 CM HEMASHIELD PLATINUM VASCULAR GRAFT;  Surgeon: Wonda Olds, MD;  [facility];  Service: Open Heart Surgery;  Laterality: N/A;   Patient Active Problem List   Diagnosis Date Noted   Right hemiparesis ([place]) 04/06/2022   Acute cardioembolic stroke ([place]) 66/10/3014   Stroke (cerebrum) ([place]) 02/23/2022   Stroke ([place]) 02/23/2022   Hypervolemia associated with renal insufficiency 01/29/2022   Dyslipidemia 01/29/2022   DNR (do not resuscitate) 01/29/2022   Polysubstance abuse ([place]) 01/06/2022   Acute on chronic combined systolic and diastolic CHF (congestive heart failure) ([place]) 01/06/2022   Abdominal pain 06/09/3233   Acute metabolic encephalopathy 57/32/2025   Malignant HTN with heart disease, w/o CHF, 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 12/24/2021   History of hepatitis C 12/08/2021   Hypertensive urgency 10/29/2021    Fluid overload 10/29/2021   Acute on chronic HFrEF (heart failure with reduced ejection fraction) ([place]) 10/29/2021   Chronic anticoagulation 10/29/2021   Prolonged QT interval 10/29/2021   Leukopenia 10/29/2021   SOB (shortness of breath) 09/15/2021   Hypertensive emergency 08/23/2021   Altered mental status    ESRD (end stage renal disease) on dialysis with hyperkalemia and metabolic acidosis with elevated AG  08/12/2021   Malignant hypertension 06/19/2021   Anemia 06/05/2021   Pure hypercholesterolemia 02/14/2021   Acute pulmonary edema ([place]) 12/23/2020   Acute respiratory failure with hypoxia ([place]) 09/21/2020   Acute renal failure superimposed on chronic kidney disease ([place]) 09/21/2020   Community acquired pneumonia 09/21/2020   Uremia 42/70/6237   Metabolic acidosis with increased anion gap and accumulation of organic acids 09/21/2020   Hypertensive crisis 09/21/2020   Troponin level elevated 08/28/2019   Positive D dimer 08/28/2019   Hyperkalemia 08/28/2019   SVT (supraventricular tachycardia) 08/28/2019   SIRS (systemic inflammatory response syndrome) ([place]) 08/27/2019   Paroxysmal SVT (supraventricular tachycardia) 08/27/2019   Aortic atherosclerosis ([place]) 07/05/2019   Chest pain 07/05/2019   History of CVA (cerebrovascular accident) 05/29/2019  AF (paroxysmal atrial fibrillation) ([place]) 05/29/2019   ESRD on dialysis ([place]) 05/29/2019   Cerebral thrombosis with cerebral infarction 05/22/2019   Malnutrition of moderate degree 05/19/2019   Pleural effusion 05/18/2019   Atrial fibrillation with RVR ([place]) 05/18/2019   S/P aortic aneurysm repair 04/07/2019   Aortic dissection ([place]) 04/04/2019   Dissection of aorta ([place]) 04/03/2019   Non compliance with medical treatment 12/04/2017   Chronic obstructive lung disease ([place]) 01/16/2017   Chronic low back pain 06/22/2016   Insomnia 03/14/2015   S/P lumbar spinal fusion 01/18/2014   Tobacco use disorder 04/19/2009   GERD  09/16/2006    ONSET DATE: 02/23/2022   REFERRING DIAG: I63.9 (ICD-10-CM) - Cerebral infarction, unspecified   THERAPY DIAG:  Cognitive communication deficit  Rationale for Evaluation and Treatment: Rehabilitation  SUBJECTIVE:   SUBJECTIVE STATEMENT: "Just been at home"  PAIN:  Are you having pain? R hand, 10/10, hand appears swollen, sister tells SLP pt should keep it elevated, pt declines    OBJECTIVE:   TODAY'S TREATMENT: 04-20-22: SLP provided additional education on dysarthria strategies to aid in overall intelligibility with focus this date on slowed rate, overarticulation, and increased vocal intensity. Given tri-syllabic words containing /s/ in all positions, pt able to produce at word level 85% accuracy on intial attempt; produce in generative sentence 75% accuracy. SLP provides occasional mod-A for use of strategies with occasional requirement for direct model. Pt demonstrates spontaneous self correction x4 over course of structured practice. At conclusion of session, pt ID "break up my words" as strategy which A in improved intelligibility.    04-13-22: SLP provided education on evaluation observations and recommendations for therapy course. Initiated training re: dysarthria compensations and strategies to aid in improved communication efficacy. Collaborated with pt to establish POC and goals. Pt in agreement with POC and denies questions at conclusion of evaluation.    PATIENT EDUCATION: Education details: see above Person educated: Patient and sister Education method: Customer service manager Education comprehension: verbalized understanding, returned demonstration, and needs further education   GOALS: Goals reviewed with patient? Yes  SHORT TERM GOALS: Target date: 05/05/2022  Pt will intelligibly produce multi-syllabic words containing blends at generative sentence level in 18/20 trials with rare min-A Baseline: Goal status: INITIAL  2.  Pt will teach  back strategies and compensations to aid in communication efficacy with various communication partners  Baseline:  Goal status: INITIAL  3.  Pt will maintain 68+ dB average during 5 minute conversation with occasional mod-A over 2 sessions Baseline:  Goal status: INITIAL  4.  Pt will read 1 paragraph with use of compensations PRN, demonstrating carryover of dysarthria strategies with rare min-A Baseline:  Goal status: INITIAL   LONG TERM GOALS: Target date: 07/07/2022  Pt will report BID dysarthria home practice 5/7 days over 1 week period with mod-I Baseline:  Goal status: INITIAL  2.  Pt will maintain 68+ dB average over 20 minute conversation with occasional min-A Baseline:  Goal status: INITIAL  3.  Pt will complete mult-paragraph oral reading, demonstrating carryover of dysarthria strategies with rare min-A Baseline:  Goal status: INITIAL  4.  Pt will report improve subjective perception of speech efficacy via patient reported outcome measure by d/c  Baseline: 21 Goal status: INITIAL   ASSESSMENT:  CLINICAL IMPRESSION: Patient is a 71 y.o. F presenting with moderate dysarthria. Intelligibility in conversation is reduced to approximately 80%, with frequent requests for repetition required from SLP. Language and cognitive skills appears to be within gross  functional limits. Pt reports functional swallow with pt having returned to eating regular diet at home. Pt demonstrating understanding of using reduced rate to aid in intelligibility but is showing limited carryover of strategy to conversational speech. Skilled ST is indicated to maximize carryover of strategies to improve pt's communication efficacy.   OBJECTIVE IMPAIRMENTS: include dysarthria. These impairments are limiting patient from effectively communicating at home and in community. Factors affecting potential to achieve goals and functional outcome are severity of impairments. Patient will benefit from skilled SLP  services to address above impairments and improve overall function.  REHAB POTENTIAL: Good  PLAN:  SLP FREQUENCY: 1x/week  SLP DURATION: 12 weeks  PLANNED INTERVENTIONS: Language facilitation, Cueing hierachy, Internal/external aids, Functional tasks, Multimodal communication approach, SLP instruction and feedback, Compensatory strategies, and Patient/family education    Su Monks, CCC-SLP 04/20/2022, 11:04 AM

## 2022-04-21 ENCOUNTER — Encounter: Payer: Self-pay | Admitting: Family Medicine

## 2022-04-21 NOTE — Progress Notes (Signed)
Established Patient Office Visit  Subjective    Patient ID: Monique [facility], female    DOB: [birth year]  Age: 71 y.o. MRN: [id number]  CC:  Chief Complaint  Patient presents with   Follow-up    HPI [name] presents for routine follow up of chronic med issues. Patient denies acute complaints.    Outpatient Encounter Medications as of 04/20/2022  Medication Sig   albuterol (VENTOLIN HFA) 108 (90 Base) MCG/ACT inhaler Inhale 2 puffs into the lungs every 6 (six) hours as needed for wheezing or shortness of breath.   amLODipine (NORVASC) 10 MG tablet Take 1 tablet (10 mg total) by mouth daily.   apixaban (ELIQUIS) 2.5 MG TABS tablet Take 1 tablet (2.5 mg total) by mouth 2 (two) times daily.   carvedilol (COREG) 12.5 MG tablet Take 1 tablet (12.5 mg total) by mouth 2 (two) times daily with a meal.   Cholecalciferol (VITAMIN D-3 PO) Take 1 capsule by mouth daily.   cloNIDine (CATAPRES - DOSED IN MG/24 HR) 0.1 mg/24hr patch Place 1 patch (0.1 mg total) onto the skin every Saturday.   diphenhydrAMINE-zinc acetate (BENADRYL) cream Apply topically 3 (three) times daily as needed for itching.   hydrALAZINE (APRESOLINE) 25 MG tablet Take 25 mg by mouth 2 (two) times daily.   naloxone (NARCAN) nasal spray 4 mg/0.1 mL 1 spray as directed.   oxyCODONE-acetaminophen (PERCOCET) 10-325 MG tablet Take 1 tablet by mouth 3 (three) times daily as needed.   sevelamer carbonate (RENVELA) 2.4 g PACK Take 2.4 g by mouth 3 (three) times daily with meals.   [DISCONTINUED] isosorbide mononitrate (IMDUR) 30 MG 24 hr tablet Take 1 tablet (30 mg total) by mouth daily.   [DISCONTINUED] rosuvastatin (CRESTOR) 10 MG tablet Take 1 tablet (10 mg total) by mouth daily.   [DISCONTINUED] traZODone (DESYREL) 50 MG tablet Take 0.5 tablets (25 mg total) by mouth at bedtime as needed for sleep.   isosorbide mononitrate (IMDUR) 30 MG 24 hr tablet Take 1 tablet (30 mg total) by mouth daily.   lidocaine (LIDODERM) 5  % Place 1 patch onto the skin daily. Remove & Discard patch within 12 hours or as directed by MD (Patient not taking: Reported on 04/13/2022)   rosuvastatin (CRESTOR) 10 MG tablet Take 1 tablet (10 mg total) by mouth daily.   traZODone (DESYREL) 50 MG tablet Take 0.5 tablets (25 mg total) by mouth at bedtime as needed for sleep.   No facility-administered encounter medications on file as of 04/20/2022.    Past Medical History:  Diagnosis Date   AF (paroxysmal atrial fibrillation) ([place]) 05/29/2019   on Coumadin   Aortic atherosclerosis ([place]) 07/05/2019   Aortic dissection ([place]) 04/04/2019   s/p repair   Bone spur 2008   Right calcaneal foot spur   Breast cancer ([place]) 2004   Ductal carcinoma in situ of the left breast; S/P left partial mastectomy 02/26/2003; S/P re-excision of cranial and lateral margins11/18/2004.radiation   Cerebral thrombosis with cerebral infarction 05/22/2019   Chronic low back pain 06/22/2016   Chronic obstructive lung disease ([place]) 01/16/2017   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 Autumn Messing; S/P re-excision of superior and inferior margins 10/27/2012.    ESRD on hemodialysis ([place]) 05/29/2019   Essential hypertension 09/16/2006   GERD 09/16/2006   Hepatitis C    treated and RNA confirmed not detectable 01/2017   Insomnia 03/14/2015   Malnutrition of moderate  degree 05/19/2019   Non compliance with medical treatment 12/04/2017   Normocytic anemia    With thrombocytosis   Osteoarthritis    Right ureteral stone 2002   S/P lumbar spinal fusion 01/18/2014   S/P lumbar decompressive laminectomy, fusion, and plating for lumbar spinal stenosis on 05/27/2009 by Dr. Eustace Moore.  S/P anterolateral retroperitoneal interbody fusion L2-3 utilizing a 8 mm peek interbody cage packed with morcellized allograft, and anterior lumbar plating L2-3 for recurrent disc herniation L2-3 with spinal stenosis on 01/18/2014  by Dr. Eustace Moore.     Tobacco use disorder 04/19/2009   Uterine fibroid    Wears dentures    top    Past Surgical History:  Procedure Laterality Date   ANTERIOR LAT LUMBAR FUSION N/A 01/18/2014   Procedure: ANTERIOR LATERAL LUMBAR FUSION LUMBAR TWO-THREE;  Surgeon: Eustace Moore, MD;  Location: [facility];  Service: Neurosurgery;  Laterality: N/A;   ANTERIOR LUMBAR FUSION  01/18/2014   AV FISTULA PLACEMENT Left 04/20/2019   Procedure: ARTERIOVENOUS (AV) FISTULA CREATION;  Surgeon: [name], MD;  Location: [facility];  Service: Vascular;  Laterality: Left;   BACK SURGERY     BREAST LUMPECTOMY Left 01/2003   BREAST LUMPECTOMY Right 2014   BREAST LUMPECTOMY WITH NEEDLE LOCALIZATION AND AXILLARY SENTINEL LYMPH NODE BX Right 10/13/2012   Procedure: BREAST LUMPECTOMY WITH NEEDLE LOCALIZATION;  Surgeon: [name], MD;  Location: [facility];  Service: General;  Laterality: Right;  Right breast wire localized lumpectomy   INSERTION OF DIALYSIS CATHETER Right 04/20/2019   Procedure: INSERTION OF DIALYSIS CATHETER, right internal jugular;  Surgeon: [name], MD;  Location: [facility];  Service: Vascular;  Laterality: Right;   INSERTION OF DIALYSIS CATHETER Right 09/24/2020   Procedure: INSERTION OF TUNNELED DIALYSIS CATHETER;  Surgeon: Rosetta Posner, MD;  Location: [facility];  Service: Vascular;  Laterality: Right;   IR FLUORO GUIDE CV LINE RIGHT  09/22/2020   IR THORACENTESIS ASP PLEURAL SPACE W/IMG GUIDE  05/19/2019   IR US GUIDE VASC ACCESS LEFT  09/22/2020   IR US GUIDE VASC ACCESS RIGHT  09/22/2020   IR VENOCAVAGRAM SVC  09/22/2020   LAMINECTOMY  05/27/2009   Lumbar decompressive laminectomy, fusion and plating for lumbar spinal stensosis   LIGATION OF ARTERIOVENOUS  FISTULA Left 09/24/2020   Procedure: LIGATION OF LEFT ARM ARTERIOVENOUS  FISTULA;  Surgeon: Rosetta Posner, MD;  Location: [facility];  Service: Vascular;  Laterality: Left;   LUMBAR  LAMINECTOMY/DECOMPRESSION MICRODISCECTOMY Left 03/23/2013   Procedure: LUMBAR LAMINECTOMY/DECOMPRESSION MICRODISCECTOMY 1 LEVEL;  Surgeon: Eustace Moore, MD;  [facility];  Service: Neurosurgery;  Laterality: Left;  LUMBAR LAMINECTOMY/DECOMPRESSION MICRODISCECTOMY 1 LEVEL   MASTECTOMY, PARTIAL Left 02/26/2003   ; S/P re-excision of cranial and lateral margins 04/19/2003.    RE-EXCISION OF BREAST CANCER,SUPERIOR MARGINS Right 10/27/2012   Procedure: RE-EXCISION OF BREAST CANCER,SUPERIOR and inferior MARGINS;  Surgeon: [name], MD;  Location: [facility];  Service: General;  Laterality: Right;   RE-EXCISION OF BREAST LUMPECTOMY Left 04/2003   TEE WITHOUT CARDIOVERSION N/A 04/04/2019   Procedure: Transesophageal Echocardiogram (Tee);  Surgeon: Wonda Olds, MD;  Location: [facility];  Service: Open Heart Surgery;  Laterality: N/A;   THORACIC AORTIC ANEURYSM REPAIR N/A 04/04/2019   Procedure: THORACIC ASCENDING ANEURYSM REPAIR (AAA)  USING 28 MM X 30 CM HEMASHIELD PLATINUM VASCULAR GRAFT;  Surgeon: Wonda Olds, MD;  Location: [facility];  Service:  Open Heart Surgery;  Laterality: N/A;    Family History  Problem Relation Age of Onset   Colon cancer Mother 71   Hypertension Mother    Diabetes Sister 54   Hypertension Sister    Diabetes Brother    Hypertension Brother    Diabetes Brother    Hypertension Brother    Kidney disease Son        s/p renal transplant   Hypertension Son    Diabetes Son    Multiple sclerosis Son    Bone cancer Sister 98   Breast cancer Neg Hx    Cervical cancer Neg Hx     Social History   Socioeconomic History   Marital status: Single    Spouse name: Not on file   Number of children: 2   Years of education: Not on file   Highest education level: Not on file  Occupational History   Occupation: disabled  Tobacco Use   Smoking status: Every Day    Packs/day: 0.25    Years: 44.00    Total pack years: 11.00    Types: Cigarettes    Last attempt  to quit: 05/24/2019    Years since quitting: 2.9   Smokeless tobacco: Never   Tobacco comments:    smoking less  Vaping Use   Vaping Use: Never used  Substance and Sexual Activity   Alcohol use: Not Currently    Alcohol/week: 2.0 standard drinks of alcohol    Types: 2 Cans of beer per week    Comment: "couple beers a weekend", not currently   Drug use: Not Currently    Types: Cocaine    Comment: last in 2005   Sexual activity: Yes    Birth control/protection: Post-menopausal  Other Topics Concern   Not on file  Social History Narrative   Not on file   Social Determinants of Health   Financial Resource Strain: Not on file  Food Insecurity: No Food Insecurity (03/12/2022)   Hunger Vital Sign    Worried About Running Out of Food in the Last Year: Never true    Ran Out of Food in the Last Year: Never true  Transportation Needs: No Transportation Needs (03/12/2022)   PRAPARE - Hydrologist (Medical): No    Lack of Transportation (Non-Medical): No  Physical Activity: Not on file  Stress: Not on file  Social Connections: Not on file  Intimate Partner Violence: Not on file    Review of Systems  Psychiatric/Behavioral:  Negative for depression and suicidal ideas. The patient has insomnia. The patient is not nervous/anxious.   All other systems reviewed and are negative.       Objective    BP (!) 162/85   Pulse 80   Temp 98.1 F (36.7 C) (Oral)   Resp 16   Wt 108 lb (49 kg)   SpO2 93%   BMI 19.13 kg/m   Physical Exam Vitals and nursing note reviewed.  Constitutional:      General: She is not in acute distress. Cardiovascular:     Rate and Rhythm: Normal rate and regular rhythm.  Pulmonary:     Effort: Pulmonary effort is normal.     Breath sounds: Normal breath sounds.  Abdominal:     Palpations: Abdomen is soft.     Tenderness: There is no abdominal tenderness.  Neurological:     General: No focal deficit present.     Mental  Status: She is alert and oriented  to person, place, and time.     Motor: Weakness present.  Psychiatric:        Mood and Affect: Mood normal.        Behavior: Behavior normal.         Assessment & Plan:   1. Essential hypertension Elevated reading. Management per consultant  2. Anxiety and depression Patient defers any further eval/mgt at this time  3. ESRD (end stage renal disease) ([place]) Management per consultant  4. Encounter for screening mammogram for malignant neoplasm of breast  - MM Digital Screening; Future  5. Insomnia, unspecified type Trazodone refilled.     Return in about 3 months (around 07/21/2022) for follow up.   Becky Sax, MD

## 2022-04-27 ENCOUNTER — Ambulatory Visit: Payer: Medicare Other | Admitting: Physical Therapy

## 2022-04-27 ENCOUNTER — Encounter: Payer: Self-pay | Admitting: Physical Therapy

## 2022-04-27 ENCOUNTER — Ambulatory Visit: Payer: Medicare Other | Admitting: Rehabilitative and Restorative Service Providers"

## 2022-04-27 ENCOUNTER — Encounter: Payer: Self-pay | Admitting: Rehabilitative and Restorative Service Providers"

## 2022-04-27 ENCOUNTER — Ambulatory Visit: Payer: Medicare Other | Admitting: Speech Pathology

## 2022-04-27 DIAGNOSIS — I639 Cerebral infarction, unspecified: Secondary | ICD-10-CM | POA: Diagnosis not present

## 2022-04-27 DIAGNOSIS — M25631 Stiffness of right wrist, not elsewhere classified: Secondary | ICD-10-CM

## 2022-04-27 DIAGNOSIS — R41841 Cognitive communication deficit: Secondary | ICD-10-CM

## 2022-04-27 DIAGNOSIS — R278 Other lack of coordination: Secondary | ICD-10-CM

## 2022-04-27 DIAGNOSIS — M6281 Muscle weakness (generalized): Secondary | ICD-10-CM

## 2022-04-27 DIAGNOSIS — M25641 Stiffness of right hand, not elsewhere classified: Secondary | ICD-10-CM

## 2022-04-27 DIAGNOSIS — R600 Localized edema: Secondary | ICD-10-CM

## 2022-04-27 DIAGNOSIS — R262 Difficulty in walking, not elsewhere classified: Secondary | ICD-10-CM

## 2022-04-27 DIAGNOSIS — R471 Dysarthria and anarthria: Secondary | ICD-10-CM

## 2022-04-27 NOTE — Therapy (Signed)
OUTPATIENT SPEECH LANGUAGE PATHOLOGY EVALUATION   Patient Name: Monique Henderson MRN: [id number] DOB:May 01, 1951, 71 y.o., female Today's Date: 04/27/2022  PCP: Dr. Dorna Mai REFERRING PROVIDER: PA-C Reesa Chew   End of Session - 04/27/22 1013     Visit Number 3    Date for SLP Re-Evaluation 07/07/22    Authorization Type [employer] Medicare    Progress Note Due on Visit 10    SLP Start Time 1015    SLP Stop Time  1100    SLP Time Calculation (min) 45 min    Activity Tolerance Patient tolerated treatment well               Past Medical History:  Diagnosis Date   AF (paroxysmal atrial fibrillation) ([place]) 05/29/2019   on Coumadin   Aortic atherosclerosis ([place]) 07/05/2019   Aortic dissection ([place]) 04/04/2019   s/p repair   Bone spur 2008   Right calcaneal foot spur   Breast cancer ([place]) 2004   Ductal carcinoma in situ of the left breast; S/P left partial mastectomy 02/26/2003; S/P re-excision of cranial and lateral margins11/18/2004.radiation   Cerebral thrombosis with cerebral infarction 05/22/2019   Chronic low back pain 06/22/2016   Chronic obstructive lung disease ([place]) 01/16/2017   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 Autumn Messing; S/P re-excision of superior and inferior margins 10/27/2012.    ESRD on hemodialysis ([place]) 05/29/2019   Essential hypertension 09/16/2006   GERD 09/16/2006   Hepatitis C    treated and RNA confirmed not detectable 01/2017   Insomnia 03/14/2015   Malnutrition of moderate degree 05/19/2019   Non compliance with medical treatment 12/04/2017   Normocytic anemia    With thrombocytosis   Osteoarthritis    Right ureteral stone 2002   S/P lumbar spinal fusion 01/18/2014   S/P lumbar decompressive laminectomy, fusion, and plating for lumbar spinal stenosis on 05/27/2009 by Dr. Eustace Moore.  S/P anterolateral retroperitoneal interbody fusion L2-3 utilizing a 8 mm peek interbody  cage packed with morcellized allograft, and anterior lumbar plating L2-3 for recurrent disc herniation L2-3 with spinal stenosis on 01/18/2014 by Dr. Eustace Moore.     Tobacco use disorder 04/19/2009   Uterine fibroid    Wears dentures    top   Past Surgical History:  Procedure Laterality Date   ANTERIOR LAT LUMBAR FUSION N/A 01/18/2014   Procedure: ANTERIOR LATERAL LUMBAR FUSION LUMBAR TWO-THREE;  Surgeon: Eustace Moore, MD;  Location: [facility];  Service: Neurosurgery;  Laterality: N/A;   ANTERIOR LUMBAR FUSION  01/18/2014   AV FISTULA PLACEMENT Left 04/20/2019   Procedure: ARTERIOVENOUS (AV) FISTULA CREATION;  Surgeon: [name], MD;  Location: [place] [facility];  Service: Vascular;  Laterality: Left;   BACK SURGERY     BREAST LUMPECTOMY Left 01/2003   BREAST LUMPECTOMY Right 2014   BREAST LUMPECTOMY WITH NEEDLE LOCALIZATION AND AXILLARY SENTINEL LYMPH NODE BX Right 10/13/2012   Procedure: BREAST LUMPECTOMY WITH NEEDLE LOCALIZATION;  Surgeon: [name], MD;  Location: [facility];  Service: General;  Laterality: Right;  Right breast wire localized lumpectomy   INSERTION OF DIALYSIS CATHETER Right 04/20/2019   Procedure: INSERTION OF DIALYSIS CATHETER, right internal jugular;  Surgeon: [name], MD;  Location: [facility];  Service: Vascular;  Laterality: Right;   INSERTION OF DIALYSIS CATHETER Right 09/24/2020   Procedure: INSERTION OF TUNNELED DIALYSIS CATHETER;  Surgeon: Rosetta Posner, MD;  Location: [facility];  Service: Vascular;  Laterality: Right;   IR FLUORO GUIDE CV LINE RIGHT  09/22/2020   IR THORACENTESIS ASP PLEURAL SPACE W/IMG GUIDE  05/19/2019   IR US GUIDE VASC ACCESS LEFT  09/22/2020   IR US GUIDE VASC ACCESS RIGHT  09/22/2020   IR VENOCAVAGRAM SVC  09/22/2020   LAMINECTOMY  05/27/2009   Lumbar decompressive laminectomy, fusion and plating for lumbar spinal stensosis   LIGATION OF ARTERIOVENOUS  FISTULA Left 09/24/2020   Procedure: LIGATION OF  LEFT ARM ARTERIOVENOUS  FISTULA;  Surgeon: Rosetta Posner, MD;  Location: [facility];  Service: Vascular;  Laterality: Left;   LUMBAR LAMINECTOMY/DECOMPRESSION MICRODISCECTOMY Left 03/23/2013   Procedure: LUMBAR LAMINECTOMY/DECOMPRESSION MICRODISCECTOMY 1 LEVEL;  Surgeon: Eustace Moore, MD;  Location: [facility];  Service: Neurosurgery;  Laterality: Left;  LUMBAR LAMINECTOMY/DECOMPRESSION MICRODISCECTOMY 1 LEVEL   MASTECTOMY, PARTIAL Left 02/26/2003   ; S/P re-excision of cranial and lateral margins 04/19/2003.    RE-EXCISION OF BREAST CANCER,SUPERIOR MARGINS Right 10/27/2012   Procedure: RE-EXCISION OF BREAST CANCER,SUPERIOR and inferior MARGINS;  Surgeon: [name], MD;  Location: [facility];  Service: General;  Laterality: Right;   RE-EXCISION OF BREAST LUMPECTOMY Left 04/2003   TEE WITHOUT CARDIOVERSION N/A 04/04/2019   Procedure: Transesophageal Echocardiogram ([place]);  Surgeon: Wonda Olds, MD;  Location: [facility];  Service: Open Heart Surgery;  Laterality: N/A;   THORACIC AORTIC ANEURYSM REPAIR N/A 04/04/2019   Procedure: THORACIC ASCENDING ANEURYSM REPAIR (AAA)  USING 28 MM X 30 CM HEMASHIELD PLATINUM VASCULAR GRAFT;  Surgeon: Wonda Olds, MD;  Location: [facility];  Service: Open Heart Surgery;  Laterality: N/A;   Patient Active Problem List   Diagnosis Date Noted   Right hemiparesis ([place]) 04/06/2022   Acute cardioembolic stroke ([place]) 56/43/3295   Stroke (cerebrum) ([place]) 02/23/2022   Stroke ([place]) 02/23/2022   Hypervolemia associated with renal insufficiency 01/29/2022   Dyslipidemia 01/29/2022   DNR (do not resuscitate) 01/29/2022   Polysubstance abuse ([place]) 01/06/2022   Acute on chronic combined systolic and diastolic CHF (congestive heart failure) ([place]) 01/06/2022   Abdominal pain 18/84/1660   Acute metabolic encephalopathy 63/06/6008   Malignant HTN with heart disease, w/o CHF, 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 12/24/2021   History of hepatitis C 12/08/2021   Hypertensive urgency  10/29/2021   Fluid overload 10/29/2021   Acute on chronic HFrEF (heart failure with reduced ejection fraction) ([place]) 10/29/2021   Chronic anticoagulation 10/29/2021   Prolonged QT interval 10/29/2021   Leukopenia 10/29/2021   SOB (shortness of breath) 09/15/2021   Hypertensive emergency 08/23/2021   Altered mental status    ESRD (end stage renal disease) on dialysis with hyperkalemia and metabolic acidosis with elevated AG  08/12/2021   Malignant hypertension 06/19/2021   Anemia 06/05/2021   Chronic diastolic congestive heart failure ([place]) 04/30/2021   Pure hypercholesterolemia 02/14/2021   Acute pulmonary edema ([place]) 12/23/2020   Acute respiratory failure with hypoxia ([place]) 09/21/2020   Acute renal failure superimposed on chronic kidney disease ([place]) 09/21/2020   Community acquired pneumonia 09/21/2020   Uremia 93/23/5573   Metabolic acidosis with increased anion gap and accumulation of organic acids 09/21/2020   Hypertensive crisis 09/21/2020   Troponin level elevated 08/28/2019   Positive D dimer 08/28/2019   Hyperkalemia 08/28/2019   SVT (supraventricular tachycardia) 08/28/2019   SIRS (systemic inflammatory response syndrome) ([place]) 08/27/2019   Paroxysmal SVT (supraventricular tachycardia) 08/27/2019   Aortic atherosclerosis ([place]) 07/05/2019   Chest pain 07/05/2019  History of CVA (cerebrovascular accident) 05/29/2019   AF (paroxysmal atrial fibrillation) ([place]) 05/29/2019   ESRD on dialysis ([place]) 05/29/2019   Cerebral thrombosis with cerebral infarction 05/22/2019   Malnutrition of moderate degree 05/19/2019   Pleural effusion 05/18/2019   Atrial fibrillation with RVR ([place]) 05/18/2019   S/P aortic aneurysm repair 04/07/2019   Aortic dissection ([place]) 04/04/2019   Dissection of aorta ([place]) 04/03/2019   Non compliance with medical treatment 12/04/2017   Chronic obstructive lung disease ([place]) 01/16/2017   Chronic low back pain 06/22/2016   Insomnia 03/14/2015   S/P lumbar  spinal fusion 01/18/2014   Tobacco use disorder 04/19/2009   GERD 09/16/2006    ONSET DATE: 02/23/2022   REFERRING DIAG: I63.9 (ICD-10-CM) - Cerebral infarction, unspecified   THERAPY DIAG:  Cognitive communication deficit  Dysarthria and anarthria  Rationale for Evaluation and Treatment: Rehabilitation  SUBJECTIVE:   SUBJECTIVE STATEMENT: "I been talking to everybody"   PAIN:  Are you having pain? denies   OBJECTIVE:   TODAY'S TREATMENT: 04-27-22: Pt reports to improved communication across variety of communication partners. In opening conversation of 10 minutes, pt evidences reduced rate and syllabification with rare need for request for repetition. SLP provided education and modeling of dysarthria strategies of slowed rate and over articulation. Following, pt provided with oral reading passage. Achieved 70% intelligibility initial attempt. With re-education and coaching on tricky words, pt able to achieve 90% accuracy on second attempt of same stimuli Novel stimuli, pt employs strategies and achieves 88% intelligibility on initial attempt. Education on topic maintenance or context to aid in communication efficacy.   04-20-22: SLP provided additional education on dysarthria strategies to aid in overall intelligibility with focus this date on slowed rate, overarticulation, and increased vocal intensity. Given tri-syllabic words containing /s/ in all positions, pt able to produce at word level 85% accuracy on intial attempt; produce in generative sentence 75% accuracy. SLP provides occasional mod-A for use of strategies with occasional requirement for direct model. Pt demonstrates spontaneous self correction x4 over course of structured practice. At conclusion of session, pt ID "break up my words" as strategy which A in improved intelligibility.    04-13-22: SLP provided education on evaluation observations and recommendations for therapy course. Initiated training re: dysarthria  compensations and strategies to aid in improved communication efficacy. Collaborated with pt to establish POC and goals. Pt in agreement with POC and denies questions at conclusion of evaluation.    PATIENT EDUCATION: Education details: see above Person educated: Patient and sister Education method: Customer service manager Education comprehension: verbalized understanding, returned demonstration, and needs further education   GOALS: Goals reviewed with patient? Yes  SHORT TERM GOALS: Target date: 05/05/2022  Pt will intelligibly produce multi-syllabic words containing blends at generative sentence level in 18/20 trials with rare min-A Baseline: Goal status: INITIAL  2.  Pt will teach back strategies and compensations to aid in communication efficacy with various communication partners  Baseline:  Goal status: INITIAL  3.  Pt will maintain 68+ dB average during 5 minute conversation with occasional mod-A over 2 sessions Baseline:  Goal status: INITIAL  4.  Pt will read 1 paragraph with use of compensations PRN, demonstrating carryover of dysarthria strategies with rare min-A Baseline:  Goal status: INITIAL   LONG TERM GOALS: Target date: 07/07/2022  Pt will report BID dysarthria home practice 5/7 days over 1 week period with mod-I Baseline:  Goal status: INITIAL  2.  Pt will maintain 68+ dB average over 20  minute conversation with occasional min-A Baseline:  Goal status: INITIAL  3.  Pt will complete mult-paragraph oral reading, demonstrating carryover of dysarthria strategies with rare min-A Baseline:  Goal status: INITIAL  4.  Pt will report improve subjective perception of speech efficacy via patient reported outcome measure by d/c  Baseline: 21 Goal status: INITIAL   ASSESSMENT:  CLINICAL IMPRESSION: Patient is a 71 y.o. 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 Session focused on employment of dysarthria  compensations in progressively difficult speech tasks to aid in generalization to conversational speech. Continue per POC.   OBJECTIVE IMPAIRMENTS: include dysarthria. These impairments are limiting patient from effectively communicating at home and in community. Factors affecting potential to achieve goals and functional outcome are severity of impairments. Patient will benefit from skilled SLP services to address above impairments and improve overall function.  REHAB POTENTIAL: Good  PLAN:  SLP FREQUENCY: 1x/week  SLP DURATION: 12 weeks  PLANNED INTERVENTIONS: Language facilitation, Cueing hierachy, Internal/external aids, Functional tasks, Multimodal communication approach, SLP instruction and feedback, Compensatory strategies, and Patient/family education    Su Monks, CCC-SLP 04/27/2022, 10:14 AM

## 2022-04-27 NOTE — Therapy (Signed)
OUTPATIENT OCCUPATIONAL THERAPY TREATMENT NOTE  Patient Name: Monique Henderson MRN: [id number] DOB:Jan 02, 1951, 71 y.o., female Today's Date: 04/27/2022  PCP: N/A REFERRING PROVIDER: Bary Leriche, PA-C    OT End of Session - 04/27/22 1512     Visit Number 5    Number of Visits 12    Date for OT Re-Evaluation 05/15/22    Authorization Type [employer] Medicare    Progress Note Due on Visit 10    OT Start Time 1515    OT Stop Time 1558    OT Time Calculation (min) 43 min    Activity Tolerance Patient tolerated treatment well;No increased pain;Patient limited by pain;Patient limited by fatigue    Behavior During Therapy [facility] for tasks assessed/performed               Past Medical History:  Diagnosis Date   AF (paroxysmal atrial fibrillation) ([place]) 05/29/2019   on Coumadin   Aortic atherosclerosis ([place]) 07/05/2019   Aortic dissection ([place]) 04/04/2019   s/p repair   Bone spur 2008   Right calcaneal foot spur   Breast cancer ([place]) 2004   Ductal carcinoma in situ of the left breast; S/P left partial mastectomy 02/26/2003; S/P re-excision of cranial and lateral margins11/18/2004.radiation   Cerebral thrombosis with cerebral infarction 05/22/2019   Chronic low back pain 06/22/2016   Chronic obstructive lung disease ([place]) 01/16/2017   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 Autumn Messing; S/P re-excision of superior and inferior margins 10/27/2012.    ESRD on hemodialysis ([place]) 05/29/2019   Essential hypertension 09/16/2006   GERD 09/16/2006   Hepatitis C    treated and RNA confirmed not detectable 01/2017   Insomnia 03/14/2015   Malnutrition of moderate degree 05/19/2019   Non compliance with medical treatment 12/04/2017   Normocytic anemia    With thrombocytosis   Osteoarthritis    Right ureteral stone 2002   S/P lumbar spinal fusion 01/18/2014   S/P lumbar decompressive laminectomy, fusion, and plating for lumbar  spinal stenosis on 05/27/2009 by Dr. Eustace .  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 Eustace .     Tobacco use disorder 04/19/2009   Uterine fibroid    Wears dentures    top   Past Surgical History:  Procedure Laterality Date   ANTERIOR LAT LUMBAR FUSION N/A 01/18/2014   Procedure: ANTERIOR LATERAL LUMBAR FUSION LUMBAR TWO-THREE;  Surgeon: Eustace , MD;  Location: [place] [facility];  Service: Neurosurgery;  Laterality: N/A;   ANTERIOR LUMBAR FUSION  01/18/2014   AV FISTULA PLACEMENT Left 04/20/2019   Procedure: ARTERIOVENOUS (AV) FISTULA CREATION;  Surgeon: [name], MD;  Location: [place];  Service: Vascular;  Laterality: Left;   BACK SURGERY     BREAST LUMPECTOMY Left 01/2003   BREAST LUMPECTOMY Right 2014   BREAST LUMPECTOMY WITH NEEDLE LOCALIZATION AND AXILLARY SENTINEL LYMPH NODE BX Right 10/13/2012   Procedure: BREAST LUMPECTOMY WITH NEEDLE LOCALIZATION;  Surgeon: [name], MD;  Location: [place];  Service: General;  Laterality: Right;  Right breast wire localized lumpectomy   INSERTION OF DIALYSIS CATHETER Right 04/20/2019   Procedure: INSERTION OF DIALYSIS CATHETER, right internal jugular;  Surgeon: [name], MD;  Location: [place];  Service: Vascular;  Laterality: Right;   INSERTION OF DIALYSIS CATHETER Right 09/24/2020   Procedure: INSERTION OF TUNNELED DIALYSIS CATHETER;  Surgeon: Rosetta Posner, MD;  Location: [place];  Service: Vascular;  Laterality: Right;   IR FLUORO GUIDE CV LINE RIGHT  09/22/2020   IR THORACENTESIS ASP PLEURAL SPACE W/IMG GUIDE  05/19/2019   IR US GUIDE VASC ACCESS LEFT  09/22/2020   IR US GUIDE VASC ACCESS RIGHT  09/22/2020   IR VENOCAVAGRAM SVC  09/22/2020   LAMINECTOMY  05/27/2009   Lumbar  decompressive laminectomy, fusion and plating for lumbar spinal stensosis   LIGATION OF ARTERIOVENOUS  FISTULA Left 09/24/2020   Procedure: LIGATION OF LEFT ARM ARTERIOVENOUS  FISTULA;  Surgeon: Rosetta Posner, MD;  Location: [place];  Service: Vascular;  Laterality: Left;   LUMBAR LAMINECTOMY/DECOMPRESSION MICRODISCECTOMY Left 03/23/2013   Procedure: LUMBAR LAMINECTOMY/DECOMPRESSION MICRODISCECTOMY 1 LEVEL;  Surgeon: Eustace , MD;  Location: [place] [facility];  Service: Neurosurgery;  Laterality: Left;  LUMBAR LAMINECTOMY/DECOMPRESSION MICRODISCECTOMY 1 LEVEL   MASTECTOMY, PARTIAL Left 02/26/2003   ; S/P re-excision of cranial and lateral margins 04/19/2003.    RE-EXCISION OF BREAST CANCER,SUPERIOR MARGINS Right 10/27/2012   Procedure: RE-EXCISION OF BREAST CANCER,SUPERIOR and inferior MARGINS;  Surgeon: [name], MD;  Location: [place];  Service: General;  Laterality: Right;   RE-EXCISION OF BREAST LUMPECTOMY Left 04/2003   TEE WITHOUT CARDIOVERSION N/A 04/04/2019   Procedure: Transesophageal Echocardiogram ([place]);  Surgeon: Wonda Olds, MD;  Location: [place];  Service: Open Heart Surgery;  Laterality: N/A;   THORACIC AORTIC ANEURYSM REPAIR N/A 04/04/2019   Procedure: THORACIC ASCENDING ANEURYSM REPAIR (AAA)  USING 28 MM X 30 CM HEMASHIELD PLATINUM VASCULAR GRAFT;  Surgeon: Wonda Olds, MD;  Location[facility];  Service: Open Heart Surgery;  Laterality: N/A;   Patient Active Problem List   Diagnosis Date Noted   Right hemiparesis ([place]) 04/06/2022   Acute cardioembolic stroke ([place]) 43/88/8757   Stroke (cerebrum) ([place]) 02/23/2022   Stroke ([place]) 02/23/2022   Hypervolemia associated with renal insufficiency 01/29/2022   Dyslipidemia 01/29/2022   DNR (do not resuscitate) 01/29/2022   Polysubstance abuse ([place]) 01/06/2022   Acute on chronic combined systolic and diastolic CHF (congestive heart failure) ([place]) 01/06/2022   Abdominal pain 97/28/2060   Acute metabolic encephalopathy 15/61/5379    Malignant HTN with heart disease, w/o CHF, 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 12/24/2021   History of hepatitis C 12/08/2021   Hypertensive urgency 10/29/2021   Fluid overload 10/29/2021   Acute on chronic HFrEF (heart failure with reduced ejection fraction) ([place]) 10/29/2021   Chronic anticoagulation 10/29/2021   Prolonged QT interval 10/29/2021   Leukopenia 10/29/2021   SOB (shortness of breath) 09/15/2021   Hypertensive emergency 08/23/2021   Altered mental status    ESRD (end stage renal disease) on dialysis with hyperkalemia and metabolic acidosis with elevated AG  08/12/2021   Malignant hypertension 06/19/2021   Anemia 06/05/2021   Chronic diastolic congestive heart failure ([place]) 04/30/2021   Pure hypercholesterolemia 02/14/2021   Acute pulmonary edema ([place]) 12/23/2020   Acute respiratory failure with hypoxia ([place]) 09/21/2020   Acute renal failure superimposed on chronic kidney disease ([place]) 09/21/2020   Community acquired pneumonia 09/21/2020   Uremia 43/27/6147   Metabolic acidosis with increased anion gap and accumulation of organic acids 09/21/2020   Hypertensive crisis 09/21/2020   Troponin level elevated 08/28/2019   Positive D dimer 08/28/2019   Hyperkalemia 08/28/2019   SVT (supraventricular tachycardia) 08/28/2019   SIRS (  systemic inflammatory response syndrome) (H[facility]) 08/27/2019   Paroxysmal SVT (supraventricular tachycardia) 08/27/2019   Aortic atherosclerosis ([place]) 07/05/2019   Chest pain 07/05/2019   History of CVA (cerebrovascular accident) 05/29/2019   AF (paroxysmal atrial fibrillation) ([place]) 05/29/2019   ESRD on dialysis ([place]) 05/29/2019   Cerebral thrombosis with cerebral infarction 05/22/2019   Malnutrition of moderate degree 05/19/2019   Pleural effusion 05/18/2019   Atrial fibrillation with RVR ([place]) 05/18/2019   S/P aortic aneurysm repair 04/07/2019   Aortic dissection ([place]) 04/04/2019   Dissection of aorta ([place]) 04/03/2019   Non compliance with medical  treatment 12/04/2017   Chronic obstructive lung disease ([place]) 01/16/2017   Chronic low back pain 06/22/2016   Insomnia 03/14/2015   S/P lumbar spinal fusion 01/18/2014   Tobacco use disorder 04/19/2009   GERD 09/16/2006    ONSET DATE:  DOI: 02/22/22 (CVA)  REFERRING DIAG: I63.9 (ICD-10-CM) - Cerebral infarction, unspecified  THERAPY DIAG:  Localized edema  Other lack of coordination  Stiffness of right hand, not elsewhere classified  Muscle weakness (generalized)  Stiffness of right wrist, not elsewhere classified  Rationale for Evaluation and Treatment: Rehabilitation  PERTINENT HISTORY: She suffered a stroke on 02/22/22 to her Lt posterior-frontal parietal and anterior-parietal areas causing dysphagia, right hemiplegia, speech issues, and cognitive problems. She spent ~2 weeks in the hospital, in inpatient rehab. At discharge from inpatient rehab, she was noted to have: no right handed grip, 75% intelligible speech, SBA with no device for transfers, dysphagia, HTN, HLD, tachycardia, A-fib, CKD, and chest pain (other than cardiac).   PRECAUTIONS: Fall and Other: Rt hemiplegia, speech, swallow and cognitive issues  WEIGHT BEARING RESTRICTIONS: No   SUBJECTIVE:   SUBJECTIVE STATEMENT: She states feeling tired.  She states after therapy she will go home and sleep because "I am lazy."  OT encourages her to stay active.   PAIN:  Are you having pain? No  Rating: 0/10 at rest now    PATIENT GOALS: To get right dom arm moving better.    OBJECTIVE: (All objective assessments below are from initial evaluation on: 04/01/22 unless otherwise specified.)    HAND DOMINANCE: Right   ADLs: Overall ADLs: States decreased ability to grab, hold household objects, pain and inability to open containers, perform FMS tasks (manipulate fasteners on clothing), mild to moderate bathing problems as well.    FUNCTIONAL OUTCOME MEASURES: Eval: Quck DASH 38.6% impairment today  (Higher %  Score  =  More Impairment)     UPPER EXTREMITY ROM     Shoulder to Wrist AROM Right eval Rt  04/08/22  Shoulder flexion 108* in scaption 118* in scaption  Shoulder abduction 128* 107*  Shoulder extension 68   Shoulder internal rotation    Shoulder external rotation    Elbow flexion 130 138*  Elbow extension (-10) 0*  Forearm supination 65 51*  Forearm pronation  65 60*  Wrist flexion 15   Wrist extension 4   Wrist ulnar deviation 19   Wrist radial deviation 5   (Blank rows = not tested)  Eval: She has some (~10-15*) total motion in composite finger flexion and extension today with prolonged squeezes. Thumb motion is nil.   Hand AROM Right eval  Full Fist Ability (or Gap to Distal Palmar Crease) 5.5cm gap from MF tip to [facility]  Thumb Opposition to Small Finger (or Gap) unable  Thumb Opposition to Base of Small Finger (or Gap)  unable  (Blank rows = not tested)   UPPER EXTREMITY MMT:  Eval:  Right Arm: Grossly 3+/5 at shoulder, 3+/5 at elbow, 3-/5 at forearm, 2-/5 at wrist and 1+/5 in fingers  MMT Right TBD  Shoulder flexion   Shoulder abduction   Shoulder adduction   Shoulder extension   Shoulder internal rotation   Shoulder external rotation   Middle trapezius   Lower trapezius   Elbow flexion   Elbow extension   Forearm supination   Forearm pronation   Wrist flexion   Wrist extension   Wrist ulnar deviation   Wrist radial deviation   (Blank rows = not tested)  HAND FUNCTION: Eval: Observed weakness in affected hand.  Grip strength Right: 0 lbs, Left: 26 lbs   COORDINATION: 04/27/22: Box and Blocks Test: Right able to grasp and lift block but it drops from hand before she can move it over.   Eval: Observed coordination impairments with affected rt hand. Box and Blocks Test: Right unable to grasp a block today (TBD is [facility])  SENSATION: Eval:  Light touch intact today, equal b/l per pt  EDEMA:   04/15/22: 46.7cm today at start (yesterday had dialysis  and it obviously made it worse). (45cm after facilitated e-stim.)   04/13/22: 45cm figure of 8 around Rt hand to start    COGNITION: Eval: Overall cognitive status: overtly seemed [facility] for evaluation today, though perhaps a bit impulsive.  She is working with SLP for this and speech   OBSERVATIONS:   Eval: She has fairly good motion at shoulder and elbow, speech can be difficult to understand    TODAY'S TREATMENT:  04/27/22: Due to rampant swelling in right hand still, OT does edema control techniques and wraps her hand with gauze roll (as this is hypoallergenic and she states having some itching after elastic tubigrip was applied previously) followed by compressive Coban wrap to try to help with swelling (manual therapy).  OT is worried that as long as it stays swollen her motion is even more limited which will cause very negative outcomes post stroke.  Next OT helps strap her hand to the UBE and she does 3 minutes assisted push and pull at 0 resistance with hand overhand assist from OT as well.  Next she does functional activity to catch and toss a large therapy ball back-and-forth with OT.  She is able to do this with gross motor controls, though her hand does not contribute much other than a stabilizing force.  This still gets her involved with bilateral activity.  Next she attempts to do the box and blocks Test and is able to grasp a 1 inch block today and lifted from the table, however it slips out of her hand before she can move it anywhere.  OT then uses a large cone stack and with teaching and coaching, she is able to grasp and move 12 cones from 1 stack to another.  This is encouraging for her.  She is encouraged to do as many bilateral activities as she can at home like folding laundry, if she can pinch it.  She also reviews squeezing her sponge and a palmar pinch fashion, and does so in clinic 5 times with 3-second holds.   04/15/22: Use attended e-stim to facilitate tenodesis motion  patterns with e-stim facilitating finger flexors. She did not tolerate for extensors today, due to pain c/o, but no pain in flexor group (Russian, single channel, 10/10 ratio, 50% cycle, 2 sec ramp, 50bps, up to 73m at times). Not redness or pain after. OT also uses hand-over-hand  to facilitate PNF patterns flexion/ext with her.  OT also quickly reviews the rest of HEP with her verbally and with images.   She states her stomach has crams and she does not want to do supine exercises today. She requests to leave a little early as her ride needs to leave early.    PATIENT EDUCATION: Education details: See tx section above for details  Person educated: Patient Education method: Verbal Instruction, Teach back, Handouts  Education comprehension: States and demonstrates understanding, Additional Education required    HOME EXERCISE PROGRAM: Access Code: [email or web address] URL: [email or web address] Date: 04/08/2022 Prepared by: Benito Mccreedy   GOALS: Goals reviewed with patient? Yes  SHORT TERM GOALS: Target date: 04/17/22  Pt will demo/state understanding of initial home exercise program (HEP) to improve function, pain, and independence.   Baseline: Needs a plan for rehabilitation  Goal status: MET for basic HEP now.   2.  Pt will obtain protective, custom or mobilizing orthotic for safety and to improve function.  Baseline: Needs orthotic support  Goal status: TBD (This may be necessary to protect hand, if motion does not improve.)    LONG TERM GOALS: Target date: 05/15/22  Pt will improve functional ability by decreased impairment per Quick DASH assessment to 15% or better, for better quality of life. Baseline: 38.6% Goal status: INITIAL  2.  Pt will improve A/ROM in Rt wrist flex/ext to at least 40* each, to have prerequisite/functional motion for tasks like reach and grasp.  Baseline: 15* / 4* respectively  Goal status: INITIAL  3.  Pt will improve strength in Rt elbow to  at least 4+/5 MMT to have increased functional ability to carry out selfcare and higher-level homecare tasks with no difficulty Baseline: 3+/5 MMT Goal status: INITIAL  4.  Pt will improve coordination skills in Rt hand, as seen by increasing score on box and blocks testing to at least 20 blocks to have increased functional ability to carry out fine motor tasks (fasteners, etc.) and more complex, coordinated IADLs (meal prep, sports, etc.).  Baseline: unable to grab a block Goal status: INITIAL  5.   Pt will improve grip strength in effected hand to at least 15# lbs for functional use at home and in IADLs. Baseline: 0 lbs Goal status: INITIAL   ASSESSMENT:  CLINICAL IMPRESSION: 04/27/22: She has a great session functionally, despite continued pervasive swelling in the right hand and fingers.  Continue on plan of care  04/15/22: Electric stimulation for neuromuscular facilitation is very effective today to achieve finger flexion and facilitated tenodesis patterns.  It also serves to help decrease edema which comes down about 1.5 cm after this is performed.  OT uses this is an example for why she should keep her hand moving actively and passively as much as possible.  Edema is a major issue, exacerbated by dialysis, that is going to to limit her prognosis and return of function in the hand likely, unfortunately.  Increased swelling in her hand is also wearing her arm down causing her shoulder to be a bit more stiff and sore.  She has not been wearing her edema control elastic tubular gauze that was given to her.  Continue plan of care   PLAN:  OT FREQUENCY: 2x/week  OT DURATION: 6 weeks (through 05/15/22 as needed)   PLANNED INTERVENTIONS: self care/ADL training, therapeutic exercise, therapeutic activity, neuromuscular re-education, manual therapy, passive range of motion, splinting, electrical stimulation, ultrasound, compression bandaging, moist heat, cryotherapy, contrast bath,  patient/family education, coping  strategies training, and DME and/or AE instructions  RECOMMENDED OTHER SERVICES: she is already receiving SLP and PT, perhaps would benefit from nutrition consult, if not already going, for CKD and stroke diet recommendations   CONSULTED AND AGREED WITH PLAN OF CARE: Patient and family member/caregiver  PLAN FOR NEXT SESSION:  Continue addressing edema, gross motor skills and neuromuscular reeducation as well as functional activities, like the cone stack which was motivational for her today.   Benito Mccreedy, OTR/L, CHT 04/27/2022, 4:12 PM

## 2022-04-27 NOTE — Therapy (Signed)
OUTPATIENT PHYSICAL THERAPY NEURO TREATMENT   Patient Name: Monique Henderson MRN: [id number] DOB:09-20-1950, 71 y.o., female Today's Date: 04/27/2022   PCP: Dorna Mai REFERRING PROVIDER: Love   PT End of Session - 04/27/22 1434     Visit Number 4    Date for PT Re-Evaluation 07/01/22    PT Start Time 1434    PT Stop Time 6144    PT Time Calculation (min) 41 min    Activity Tolerance Patient tolerated treatment well    Behavior During Therapy [facility] for tasks assessed/performed             Past Medical History:  Diagnosis Date   AF (paroxysmal atrial fibrillation) ([place]) 05/29/2019   on Coumadin   Aortic atherosclerosis ([place]) 07/05/2019   Aortic dissection ([place]) 04/04/2019   s/p repair   Bone spur 2008   Right calcaneal foot spur   Breast cancer ([place]) 2004   Ductal carcinoma in situ of the left breast; S/P left partial mastectomy 02/26/2003; S/P re-excision of cranial and lateral margins11/18/2004.radiation   Cerebral thrombosis with cerebral infarction 05/22/2019   Chronic low back pain 06/22/2016   Chronic obstructive lung disease ([place]) 01/16/2017   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 Autumn Messing; S/P re-excision of superior and inferior margins 10/27/2012.    ESRD on hemodialysis ([place]) 05/29/2019   Essential hypertension 09/16/2006   GERD 09/16/2006   Hepatitis C    treated and RNA confirmed not detectable 01/2017   Insomnia 03/14/2015   Malnutrition of moderate degree 05/19/2019   Non compliance with medical treatment 12/04/2017   Normocytic anemia    With thrombocytosis   Osteoarthritis    Right ureteral stone 2002   S/P lumbar spinal fusion 01/18/2014   S/P lumbar decompressive laminectomy, fusion, and plating for lumbar spinal stenosis on 05/27/2009 by Dr. Eustace Moore.  S/P anterolateral retroperitoneal interbody fusion L2-3 utilizing a 8 mm peek interbody cage packed with morcellized  allograft, and anterior lumbar plating L2-3 for recurrent disc herniation L2-3 with spinal stenosis on 01/18/2014 by Dr. Eustace Moore.     Tobacco use disorder 04/19/2009   Uterine fibroid    Wears dentures    top   Past Surgical History:  Procedure Laterality Date   ANTERIOR LAT LUMBAR FUSION N/A 01/18/2014   Procedure: ANTERIOR LATERAL LUMBAR FUSION LUMBAR TWO-THREE;  Surgeon: Eustace Moore, MD;  Location: [place] [facility];  Service: Neurosurgery;  Laterality: N/A;   ANTERIOR LUMBAR FUSION  01/18/2014   AV FISTULA PLACEMENT Left 04/20/2019   Procedure: ARTERIOVENOUS (AV) FISTULA CREATION;  Surgeon: [name], MD;  Location: [place];  Service: Vascular;  Laterality: Left;   BACK SURGERY     BREAST LUMPECTOMY Left 01/2003   BREAST LUMPECTOMY Right 2014   BREAST LUMPECTOMY WITH NEEDLE LOCALIZATION AND AXILLARY SENTINEL LYMPH NODE BX Right 10/13/2012   Procedure: BREAST LUMPECTOMY WITH NEEDLE LOCALIZATION;  Surgeon: [name], MD;  Location: [place];  Service: General;  Laterality: Right;  Right breast wire localized lumpectomy   INSERTION OF DIALYSIS CATHETER Right 04/20/2019   Procedure: INSERTION OF DIALYSIS CATHETER, right internal jugular;  Surgeon: [name], MD;  Location: [place];  Service: Vascular;  Laterality: Right;   INSERTION OF DIALYSIS CATHETER Right 09/24/2020   Procedure: INSERTION OF TUNNELED DIALYSIS CATHETER;  Surgeon: Rosetta Posner, MD;  Location: [place];  Service: Vascular;  Laterality: Right;  IR FLUORO GUIDE CV LINE RIGHT  09/22/2020   IR THORACENTESIS ASP PLEURAL SPACE W/IMG GUIDE  05/19/2019   IR US GUIDE VASC ACCESS LEFT  09/22/2020   IR US GUIDE VASC ACCESS RIGHT  09/22/2020   IR VENOCAVAGRAM SVC  09/22/2020   LAMINECTOMY  05/27/2009   Lumbar decompressive laminectomy, fusion and plating for lumbar spinal stensosis   LIGATION OF ARTERIOVENOUS  FISTULA Left 09/24/2020   Procedure: LIGATION OF LEFT ARM ARTERIOVENOUS   FISTULA;  Surgeon: Rosetta Posner, MD;  Location: [place];  Service: Vascular;  Laterality: Left;   LUMBAR LAMINECTOMY/DECOMPRESSION MICRODISCECTOMY Left 03/23/2013   Procedure: LUMBAR LAMINECTOMY/DECOMPRESSION MICRODISCECTOMY 1 LEVEL;  Surgeon: Eustace Moore, MD;  Location: [place] [facility];  Service: Neurosurgery;  Laterality: Left;  LUMBAR LAMINECTOMY/DECOMPRESSION MICRODISCECTOMY 1 LEVEL   MASTECTOMY, PARTIAL Left 02/26/2003   ; S/P re-excision of cranial and lateral margins 04/19/2003.    RE-EXCISION OF BREAST CANCER,SUPERIOR MARGINS Right 10/27/2012   Procedure: RE-EXCISION OF BREAST CANCER,SUPERIOR and inferior MARGINS;  Surgeon: [name], MD;  Location: [place];  Service: General;  Laterality: Right;   RE-EXCISION OF BREAST LUMPECTOMY Left 04/2003   TEE WITHOUT CARDIOVERSION N/A 04/04/2019   Procedure: Transesophageal Echocardiogram ([place]);  Surgeon: Wonda Olds, MD;  Location: [place];  Service: Open Heart Surgery;  Laterality: N/A;   THORACIC AORTIC ANEURYSM REPAIR N/A 04/04/2019   Procedure: THORACIC ASCENDING ANEURYSM REPAIR (AAA)  USING 28 MM X 30 CM HEMASHIELD PLATINUM VASCULAR GRAFT;  Surgeon: Wonda Olds, MD;  Location[facility];  Service: Open Heart Surgery;  Laterality: N/A;   Patient Active Problem List   Diagnosis Date Noted   Right hemiparesis ([place]) 04/06/2022   Acute cardioembolic stroke ([place]) 64/40/3474   Stroke (cerebrum) ([place]) 02/23/2022   Stroke ([place]) 02/23/2022   Hypervolemia associated with renal insufficiency 01/29/2022   Dyslipidemia 01/29/2022   DNR (do not resuscitate) 01/29/2022   Polysubstance abuse ([place]) 01/06/2022   Acute on chronic combined systolic and diastolic CHF (congestive heart failure) ([place]) 01/06/2022   Abdominal pain 25/95/6387   Acute metabolic encephalopathy 56/43/3295   Malignant HTN with heart disease, w/o CHF, 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 12/24/2021   History of hepatitis C 12/08/2021   Hypertensive urgency 10/29/2021   Fluid overload  10/29/2021   Acute on chronic HFrEF (heart failure with reduced ejection fraction) ([place]) 10/29/2021   Chronic anticoagulation 10/29/2021   Prolonged QT interval 10/29/2021   Leukopenia 10/29/2021   SOB (shortness of breath) 09/15/2021   Hypertensive emergency 08/23/2021   Altered mental status    ESRD (end stage renal disease) on dialysis with hyperkalemia and metabolic acidosis with elevated AG  08/12/2021   Malignant hypertension 06/19/2021   Anemia 06/05/2021   Chronic diastolic congestive heart failure ([place]) 04/30/2021   Pure hypercholesterolemia 02/14/2021   Acute pulmonary edema ([place]) 12/23/2020   Acute respiratory failure with hypoxia ([place]) 09/21/2020   Acute renal failure superimposed on chronic kidney disease ([place]) 09/21/2020   Community acquired pneumonia 09/21/2020   Uremia 18/84/1660   Metabolic acidosis with increased anion gap and accumulation of organic acids 09/21/2020   Hypertensive crisis 09/21/2020   Troponin level elevated 08/28/2019   Positive D dimer 08/28/2019   Hyperkalemia 08/28/2019   SVT (supraventricular tachycardia) 08/28/2019   SIRS (systemic inflammatory response syndrome) ([place]) 08/27/2019   Paroxysmal SVT (supraventricular tachycardia) 08/27/2019   Aortic atherosclerosis ([place]) 07/05/2019   Chest pain 07/05/2019   History of CVA (cerebrovascular accident) 05/29/2019   AF (paroxysmal  atrial fibrillation) ([place]) 05/29/2019   ESRD on dialysis ([place]) 05/29/2019   Cerebral thrombosis with cerebral infarction 05/22/2019   Malnutrition of moderate degree 05/19/2019   Pleural effusion 05/18/2019   Atrial fibrillation with RVR ([place]) 05/18/2019   S/P aortic aneurysm repair 04/07/2019   Aortic dissection ([place]) 04/04/2019   Dissection of aorta ([place]) 04/03/2019   Non compliance with medical treatment 12/04/2017   Chronic obstructive lung disease ([place]) 01/16/2017   Chronic low back pain 06/22/2016   Insomnia 03/14/2015   S/P lumbar spinal fusion 01/18/2014    Tobacco use disorder 04/19/2009   GERD 09/16/2006    ONSET DATE: 02/24/22  REFERRING DIAG: Acute Stroke  THERAPY DIAG:  No diagnosis found.  Rationale for Evaluation and Treatment: Rehabilitation  SUBJECTIVE:                                                                                                                                                                                             SUBJECTIVE STATEMENT:   "Good"   Pt accompanied by: self  PERTINENT HISTORY: see above  PAIN:  Are you having pain? No  PRECAUTIONS: None  WEIGHT BEARING RESTRICTIONS: No  FALLS: Has patient fallen in last 6 months? No  LIVING ENVIRONMENT: Lives with: lives with an adult companion Lives in: House/apartment Stairs: Yes: Internal: 15 steps; on left going up Has following equipment at home: None  PLOF:  cook, clean, some flowers  PATIENT GOALS: walk better,   OBJECTIVE:   COGNITION: Overall cognitive status: Within functional limits for tasks assessed   SENSATION: WFL  COORDINATION: LE pretty good, some difficulty with foot motions/ankle   LOWER EXTREMITY ROM:     Active  Right Eval Left Eval  Hip flexion 90   Hip extension    Hip abduction    Hip adduction    Hip internal rotation    Hip external rotation    Knee flexion    Knee extension 6   Ankle dorsiflexion 10   Ankle plantarflexion    Ankle inversion 5   Ankle eversion 12    (Blank rows = not tested)  LOWER EXTREMITY MMT:    MMT Right Eval Left Eval  Hip flexion 4-   Hip extension    Hip abduction 4   Hip adduction 4   Hip internal rotation    Hip external rotation    Knee flexion 4-   Knee extension 3+   Ankle dorsiflexion 4-   Ankle plantarflexion 4-   Ankle inversion 4-   Ankle eversion 4-   (Blank rows = not tested)  TRANSFERS: Assistive device utilized: None  Sit to stand: Complete Independence Stand to sit: Complete Independence  STAIRS: Level of Assistance: SBA Stair  Negotiation Technique: Alternating Pattern  with Single Rail on Right Number of Stairs: 4  Height of Stairs: 6  Comments: some impulsivity and unsafe with turns  GAIT: Gait pattern: WFL Distance walked: 60 feet Assistive device utilized: None Level of assistance: SBA Comments: some issues with turns and on uneven surfaces  FUNCTIONAL TESTS:  5 times sit to stand: 16 Timed up and go (TUG): 13 Berg Balance Scale: 48/56  TODAY'S TREATMENT:                                                                                                                              DATE:   11/27/2 NuStep L5 x 6 min Resisted gait 20lb 4 way x 4 each Forward & Lateral step ups 4in box on airex x10  each Hamstring curls 15lb 2x10 Leg Ext 5lb 2x10 Hip Add ball squeezes 2x15 S2S holding red ball 2x10   04/13/22 NuStep L3 x 6 min 4 & 6 in step up x 10 eacj Resisted gait 20lb 4 way x 3 each S2S on airex 2x10 Hip abd ball squeeze 2x10 Hip Abd green 2x10 HS curls red 2x10    04/08/22 NuStep L5x32mns  LAQ 2# 2x10 HS curls greenTB 2x10 S2S 2x10 Ball squeezes 2x10  Hip abd greenTB 2x10 Step ups on airex- CGA  Walking on beam in // bars needs 1 HHA   PATIENT EDUCATION: Education details: POC Person educated: Patient and sister Education method: Explanation Education comprehension: verbalized understanding  HOME EXERCISE PROGRAM: TBD for LE and balance   GOALS: Goals reviewed with patient? Yes  SHORT TERM GOALS: Target date: 04/15/22  Independent with initial HEP Goal status: INITIAL  LONG TERM GOALS: Target date: 06/24/21  Increase Berg balance score to 52/56 Goal status: INITIAL  2.  Understand safety and fall risks at home Goal status: INITIAL  3.  Increase right LE strength to 4+/5 Goal status: INITIAL  4.  Walk >1000 feet uneven terrain without difficulty Goal status: INITIAL  ASSESSMENT:  CLINICAL IMPRESSION: Patient emters with no reports of falls or pain . Her R  hand is swollen today, she will be seen by OT after PT. We continued  focused on some light strengthening and functional activities today. Cue to complete Full Rom with leg curls and extensions. Some instability present with step ups using airex under box. She tolerates session well. Some difficulty understanding patient throughout session due to impaired speech.   OBJECTIVE IMPAIRMENTS: Abnormal gait, cardiopulmonary status limiting activity, decreased activity tolerance, decreased balance, decreased coordination, decreased endurance, decreased mobility, difficulty walking, decreased ROM, decreased strength, and decreased safety awareness.   REHAB POTENTIAL: Good  CLINICAL DECISION MAKING: Stable/uncomplicated  EVALUATION COMPLEXITY: Low  PLAN:  PT FREQUENCY: 2x/week  PT DURATION: 12 weeks  PLANNED INTERVENTIONS: Therapeutic exercises, Therapeutic activity, Neuromuscular re-education, Balance training, Gait training, Patient/Family education,  Self Care, Joint mobilization, Stair training, and Manual therapy  PLAN FOR NEXT SESSION: Overall she is progressing well after stroke, she has the most deficits with her right hand and her speech, some deficits with strength and motions of the LE, some impulsivity and balance issues   Scot Jun, PTA 04/27/2022, 2:35 PM

## 2022-04-27 NOTE — Patient Instructions (Signed)
What worked well for you today: Breaking up longer words and pronouncing each syllable Talking word by word -- when words just lumped together it can be harder to understand   Tips for Talking With Someone Who Has Dysarthria Good communication depends on both the person speaking and the person listening. Here are some tips for both of you.  Tips for You If you have dysarthria, here are some tips for you: Say one word or phrase before starting to talk in sentences. This will tell the listener what the topic is and help them understand what you say. For example, you can say "dinner" before starting to talk about what you want to eat. Check with listeners to make sure that they understand you. Speak slowly and loudly. Pause to let the other person think about what you have said. Try not to talk a lot when you are tired. Your speech may be harder to understand. Try pointing, drawing, or writing when you have trouble talking. Children may need help remembering to use these tips.  Tips for the Listener Share these tips with your family and friends: Talk to me in a quiet area with good lighting. Pay attention to me when I talk. Watch me as I talk. This may help you understand what I say. Let me know when you have trouble understanding me. Don't pretend to understand me. Repeat the part of what I said that you understood. Then I will not have to start all over again. If you still don't understand me, ask me yes or no questions. Or, ask me to point or write down what I am saying.

## 2022-04-27 NOTE — Therapy (Incomplete)
OUTPATIENT OCCUPATIONAL THERAPY TREATMENT NOTE  Patient Name: Monique Henderson MRN: [id number] DOB:Nov 23, 1950, 71 y.o., female Today's Date: 04/27/2022  PCP: N/A REFERRING PROVIDER: Bary Leriche, PA-C        Past Medical History:  Diagnosis Date   AF (paroxysmal atrial fibrillation) ([place]) 05/29/2019   on Coumadin   Aortic atherosclerosis ([place]) 07/05/2019   Aortic dissection ([place]) 04/04/2019   s/p repair   Bone spur 2008   Right calcaneal foot spur   Breast cancer ([place]) 2004   Ductal carcinoma in situ of the left breast; S/P left partial mastectomy 02/26/2003; S/P re-excision of cranial and lateral margins11/18/2004.radiation   Cerebral thrombosis with cerebral infarction 05/22/2019   Chronic low back pain 06/22/2016   Chronic obstructive lung disease ([place]) 01/16/2017   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 Autumn Messing; S/P re-excision of superior and inferior margins 10/27/2012.    ESRD on hemodialysis ([place]) 05/29/2019   Essential hypertension 09/16/2006   GERD 09/16/2006   Hepatitis C    treated and RNA confirmed not detectable 01/2017   Insomnia 03/14/2015   Malnutrition of moderate degree 05/19/2019   Non compliance with medical treatment 12/04/2017   Normocytic anemia    With thrombocytosis   Osteoarthritis    Right ureteral stone 2002   S/P lumbar spinal fusion 01/18/2014   S/P lumbar decompressive laminectomy, fusion, and plating for lumbar spinal stenosis on 05/27/2009 by Dr. Eustace .  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 Eustace .     Tobacco use disorder 04/19/2009   Uterine fibroid    Wears dentures    top   Past Surgical History:  Procedure Laterality Date   ANTERIOR LAT LUMBAR FUSION  N/A 01/18/2014   Procedure: ANTERIOR LATERAL LUMBAR FUSION LUMBAR TWO-THREE;  Surgeon: Eustace , MD;  Location: [facility];  Service: Neurosurgery;  Laterality: N/A;   ANTERIOR LUMBAR FUSION  01/18/2014   AV FISTULA PLACEMENT Left 04/20/2019   Procedure: ARTERIOVENOUS (AV) FISTULA CREATION;  Surgeon: [name], MD;  Location: [place];  Service: Vascular;  Laterality: Left;   BACK SURGERY     BREAST LUMPECTOMY Left 01/2003   BREAST LUMPECTOMY Right 2014   BREAST LUMPECTOMY WITH NEEDLE LOCALIZATION AND AXILLARY SENTINEL LYMPH NODE BX Right 10/13/2012   Procedure: BREAST LUMPECTOMY WITH NEEDLE LOCALIZATION;  Surgeon: [name], MD;  Location: [facility];  Service: General;  Laterality: Right;  Right breast wire localized lumpectomy   INSERTION OF DIALYSIS CATHETER Right 04/20/2019   Procedure: INSERTION OF DIALYSIS CATHETER, right internal jugular;  Surgeon: [name], MD;  Location: [place];  Service: Vascular;  Laterality: Right;   INSERTION OF DIALYSIS CATHETER Right 09/24/2020   Procedure: INSERTION OF TUNNELED DIALYSIS CATHETER;  Surgeon: Rosetta Posner, MD;  Location: [facility];  Service: Vascular;  Laterality: Right;   IR FLUORO GUIDE CV LINE RIGHT  09/22/2020   IR THORACENTESIS ASP PLEURAL SPACE W/IMG GUIDE  05/19/2019   IR US GUIDE VASC ACCESS LEFT  09/22/2020   IR US GUIDE VASC ACCESS RIGHT  09/22/2020   IR VENOCAVAGRAM SVC  09/22/2020   LAMINECTOMY  05/27/2009   Lumbar decompressive laminectomy, fusion and plating for lumbar spinal stensosis   LIGATION OF ARTERIOVENOUS  FISTULA Left 09/24/2020   Procedure: LIGATION OF LEFT ARM ARTERIOVENOUS  FISTULA;  Surgeon: Rosetta Posner, MD;  Location: [place];  Service: Vascular;  Laterality: Left;   LUMBAR LAMINECTOMY/DECOMPRESSION MICRODISCECTOMY Left 03/23/2013   Procedure: LUMBAR LAMINECTOMY/DECOMPRESSION MICRODISCECTOMY 1 LEVEL;  Surgeon: Eustace , MD;  Location: [facility] [facility];  Service:  Neurosurgery;  Laterality: Left;  LUMBAR LAMINECTOMY/DECOMPRESSION MICRODISCECTOMY 1 LEVEL   MASTECTOMY, PARTIAL Left 02/26/2003   ; S/P re-excision of cranial and lateral margins 04/19/2003.    RE-EXCISION OF BREAST CANCER,SUPERIOR MARGINS Right 10/27/2012   Procedure: RE-EXCISION OF BREAST CANCER,SUPERIOR and inferior MARGINS;  Surgeon: [name], MD;  Location: [facility];  Service: General;  Laterality: Right;   RE-EXCISION OF BREAST LUMPECTOMY Left 04/2003   TEE WITHOUT CARDIOVERSION N/A 04/04/2019   Procedure: Transesophageal Echocardiogram ([place]);  Surgeon: Wonda Olds, MD;  Location: [facility];  Service: Open Heart Surgery;  Laterality: N/A;   THORACIC AORTIC ANEURYSM REPAIR N/A 04/04/2019   Procedure: THORACIC ASCENDING ANEURYSM REPAIR (AAA)  USING 28 MM X 30 CM HEMASHIELD PLATINUM VASCULAR GRAFT;  Surgeon: Wonda Olds, MD;  Location: [facility];  Service: Open Heart Surgery;  Laterality: N/A;   Patient Active Problem List   Diagnosis Date Noted   Right hemiparesis ([place]) 04/06/2022   Acute cardioembolic stroke ([place]) 21/30/8657   Stroke (cerebrum) ([place]) 02/23/2022   Stroke ([place]) 02/23/2022   Hypervolemia associated with renal insufficiency 01/29/2022   Dyslipidemia 01/29/2022   DNR (do not resuscitate) 01/29/2022   Polysubstance abuse ([place]) 01/06/2022   Acute on chronic combined systolic and diastolic CHF (congestive heart failure) ([place]) 01/06/2022   Abdominal pain 84/69/6295   Acute metabolic encephalopathy 28/41/3244   Malignant HTN with heart disease, w/o CHF, 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 12/24/2021   History of hepatitis C 12/08/2021   Hypertensive urgency 10/29/2021   Fluid overload 10/29/2021   Acute on chronic HFrEF (heart failure with reduced ejection fraction) ([place]) 10/29/2021   Chronic anticoagulation 10/29/2021   Prolonged QT interval 10/29/2021   Leukopenia 10/29/2021   SOB (shortness of breath) 09/15/2021   Hypertensive emergency 08/23/2021   Altered mental status     ESRD (end stage renal disease) on dialysis with hyperkalemia and metabolic acidosis with elevated AG  08/12/2021   Malignant hypertension 06/19/2021   Anemia 06/05/2021   Chronic diastolic congestive heart failure ([place]) 04/30/2021   Pure hypercholesterolemia 02/14/2021   Acute pulmonary edema ([place]) 12/23/2020   Acute respiratory failure with hypoxia ([place]) 09/21/2020   Acute renal failure superimposed on chronic kidney disease ([place]) 09/21/2020   Community acquired pneumonia 09/21/2020   Uremia 06/03/7251   Metabolic acidosis with increased anion gap and accumulation of organic acids 09/21/2020   Hypertensive crisis 09/21/2020   Troponin level elevated 08/28/2019   Positive D dimer 08/28/2019   Hyperkalemia 08/28/2019   SVT (supraventricular tachycardia) 08/28/2019   SIRS (systemic inflammatory response syndrome) ([place]) 08/27/2019   Paroxysmal SVT (supraventricular tachycardia) 08/27/2019   Aortic atherosclerosis ( [place]) 07/05/2019   Chest pain 07/05/2019   History of CVA (cerebrovascular accident) 05/29/2019   AF (paroxysmal atrial fibrillation) ([place]) 05/29/2019   ESRD on dialysis ([place]) 05/29/2019   Cerebral thrombosis with cerebral infarction 05/22/2019   Malnutrition of moderate degree 05/19/2019   Pleural effusion 05/18/2019   Atrial fibrillation with RVR ([place]) 05/18/2019   S/P aortic aneurysm repair 04/07/2019   Aortic dissection ([place]) 04/04/2019   Dissection of aorta ([place]) 04/03/2019   Non compliance with medical treatment 12/04/2017  Chronic obstructive lung disease ([place]) 01/16/2017   Chronic low back pain 06/22/2016   Insomnia 03/14/2015   S/P lumbar spinal fusion 01/18/2014   Tobacco use disorder 04/19/2009   GERD 09/16/2006    ONSET DATE:  DOI: 02/22/22 (CVA)  REFERRING DIAG: I63.9 (ICD-10-CM) - Cerebral infarction, unspecified  THERAPY DIAG:  No diagnosis found.  Rationale for Evaluation and Treatment: Rehabilitation  PERTINENT HISTORY: She suffered a stroke  on 02/22/22 to her Lt posterior-frontal parietal and anterior-parietal areas causing dysphagia, right hemiplegia, speech issues, and cognitive problems. She spent ~2 weeks in the hospital, in inpatient rehab. At discharge from inpatient rehab, she was noted to have: no right handed grip, 75% intelligible speech, SBA with no device for transfers, dysphagia, HTN, HLD, tachycardia, A-fib, CKD, and chest pain (other than cardiac).   PRECAUTIONS: Fall and Other: Rt hemiplegia, speech, swallow and cognitive issues  WEIGHT BEARING RESTRICTIONS: No   SUBJECTIVE:   SUBJECTIVE STATEMENT: She states feeling tired.  She states after therapy she will go home and sleep because "I am lazy."  OT encourages her to stay active.   PAIN:  Are you having pain? No  Rating: 0/10 at rest now    PATIENT GOALS: To get right dom arm moving better.    OBJECTIVE: (All objective assessments below are from initial evaluation on: 04/01/22 unless otherwise specified.)    HAND DOMINANCE: Right   ADLs: Overall ADLs: States decreased ability to grab, hold household objects, pain and inability to open containers, perform FMS tasks (manipulate fasteners on clothing), mild to moderate bathing problems as well.    FUNCTIONAL OUTCOME MEASURES: Eval: Quck DASH 38.6% impairment today  (Higher % Score  =  More Impairment)     UPPER EXTREMITY ROM     Shoulder to Wrist AROM Right eval Rt  04/08/22  Shoulder flexion 108* in scaption 118* in scaption  Shoulder abduction 128* 107*  Shoulder extension 68   Shoulder internal rotation    Shoulder external rotation    Elbow flexion 130 138*  Elbow extension (-10) 0*  Forearm supination 65 51*  Forearm pronation  65 60*  Wrist flexion 15   Wrist extension 4   Wrist ulnar deviation 19   Wrist radial deviation 5   (Blank rows = not tested)  Eval: She has some (~10-15*) total motion in composite finger flexion and extension today with prolonged squeezes. Thumb motion is  nil.   Hand AROM Right eval  Full Fist Ability (or Gap to Distal Palmar Crease) 5.5cm gap from MF tip to [facility]  Thumb Opposition to Small Finger (or Gap) unable  Thumb Opposition to Base of Small Finger (or Gap)  unable  (Blank rows = not tested)   UPPER EXTREMITY MMT:    Eval:  Right Arm: Grossly 3+/5 at shoulder, 3+/5 at elbow, 3-/5 at forearm, 2-/5 at wrist and 1+/5 in fingers  MMT Right TBD  Shoulder flexion   Shoulder abduction   Shoulder adduction   Shoulder extension   Shoulder internal rotation   Shoulder external rotation   Middle trapezius   Lower trapezius   Elbow flexion   Elbow extension   Forearm supination   Forearm pronation   Wrist flexion   Wrist extension   Wrist ulnar deviation   Wrist radial deviation   (Blank rows = not tested)  HAND FUNCTION: Eval: Observed weakness in affected hand.  Grip strength Right: 0 lbs, Left: 26 lbs   COORDINATION: 04/27/22: Box and Blocks Test: Right  able to grasp and lift block but it drops from hand before she can move it over.   Eval: Observed coordination impairments with affected rt hand. Box and Blocks Test: Right unable to grasp a block today (TBD is [facility])  SENSATION: Eval:  Light touch intact today, equal b/l per pt  EDEMA:   04/15/22: 46.7cm today at start (yesterday had dialysis and it obviously made it worse). (45cm after facilitated e-stim.)   04/13/22: 45cm figure of 8 around Rt hand to start    COGNITION: Eval: Overall cognitive status: overtly seemed [facility] for evaluation today, though perhaps a bit impulsive.  She is working with SLP for this and speech   OBSERVATIONS:   Eval: She has fairly good motion at shoulder and elbow, speech can be difficult to understand    TODAY'S TREATMENT:  04/29/22: ***Continue addressing edema, gross motor skills and neuromuscular reeducation as well as functional activities, like the cone stack which was motivational for her today.   04/27/22: Due to rampant  swelling in right hand still, OT does edema control techniques and wraps her hand with gauze roll (as this is hypoallergenic and she states having some itching after elastic tubigrip was applied previously) followed by compressive Coban wrap to try to help with swelling (manual therapy).  OT is worried that as long as it stays swollen her motion is even more limited which will cause very negative outcomes post stroke.  Next OT helps strap her hand to the UBE and she does 3 minutes assisted push and pull at 0 resistance with hand overhand assist from OT as well.  Next she does functional activity to catch and toss a large therapy ball back-and-forth with OT.  She is able to do this with gross motor controls, though her hand does not contribute much other than a stabilizing force.  This still gets her involved with bilateral activity.  Next she attempts to do the box and blocks Test and is able to grasp a 1 inch block today and lifted from the table, however it slips out of her hand before she can move it anywhere.  OT then uses a large cone stack and with teaching and coaching, she is able to grasp and move 12 cones from 1 stack to another.  This is encouraging for her.  She is encouraged to do as many bilateral activities as she can at home like folding laundry, if she can pinch it.  She also reviews squeezing her sponge and a palmar pinch fashion, and does so in clinic 5 times with 3-second holds.   04/15/22: Use attended e-stim to facilitate tenodesis motion patterns with e-stim facilitating finger flexors. She did not tolerate for extensors today, due to pain c/o, but no pain in flexor group (Russian, single channel, 10/10 ratio, 50% cycle, 2 sec ramp, 50bps, up to 67m at times). Not redness or pain after. OT also uses hand-over-hand to facilitate PNF patterns flexion/ext with her.  OT also quickly reviews the rest of HEP with her verbally and with images.   She states her stomach has crams and she does not  want to do supine exercises today. She requests to leave a little early as her ride needs to leave early.    PATIENT EDUCATION: Education details: See tx section above for details  Person educated: Patient Education method: Verbal Instruction, Teach back, Handouts  Education comprehension: States and demonstrates understanding, Additional Education required    HOME EXERCISE PROGRAM: Access Code: [email or web address] URL: [email or web address]  Date: 04/08/2022 Prepared by: Benito Mccreedy   GOALS: Goals reviewed with patient? Yes  SHORT TERM GOALS: Target date: 04/17/22  Pt will demo/state understanding of initial home exercise program (HEP) to improve function, pain, and independence.   Baseline: Needs a plan for rehabilitation  Goal status: MET for basic HEP now.   2.  Pt will obtain protective, custom or mobilizing orthotic for safety and to improve function.  Baseline: Needs orthotic support  Goal status: TBD (This may be necessary to protect hand, if motion does not improve.)    LONG TERM GOALS: Target date: 05/15/22  Pt will improve functional ability by decreased impairment per Quick DASH assessment to 15% or better, for better quality of life. Baseline: 38.6% Goal status: INITIAL  2.  Pt will improve A/ROM in Rt wrist flex/ext to at least 40* each, to have prerequisite/functional motion for tasks like reach and grasp.  Baseline: 15* / 4* respectively  Goal status: INITIAL  3.  Pt will improve strength in Rt elbow to at least 4+/5 MMT to have increased functional ability to carry out selfcare and higher-level homecare tasks with no difficulty Baseline: 3+/5 MMT Goal status: INITIAL  4.  Pt will improve coordination skills in Rt hand, as seen by increasing score on box and blocks testing to at least 20 blocks to have increased functional ability to carry out fine motor tasks (fasteners, etc.) and more complex, coordinated IADLs (meal prep, sports, etc.).   Baseline: unable to grab a block Goal status: INITIAL  5.   Pt will improve grip strength in effected hand to at least 15# lbs for functional use at home and in IADLs. Baseline: 0 lbs Goal status: INITIAL   ASSESSMENT:  CLINICAL IMPRESSION: 04/29/22: ***  04/27/22: She has a great session functionally, despite continued pervasive swelling in the right hand and fingers.  Continue on plan of care    PLAN:  OT FREQUENCY: 2x/week  OT DURATION: 6 weeks (through 05/15/22 as needed)   PLANNED INTERVENTIONS: self care/ADL training, therapeutic exercise, therapeutic activity, neuromuscular re-education, manual therapy, passive range of motion, splinting, electrical stimulation, ultrasound, compression bandaging, moist heat, cryotherapy, contrast bath, patient/family education, coping strategies training, and DME and/or AE instructions  RECOMMENDED OTHER SERVICES: she is already receiving SLP and PT, perhaps would benefit from nutrition consult, if not already going, for CKD and stroke diet recommendations   CONSULTED AND AGREED WITH PLAN OF CARE: Patient and family member/caregiver  PLAN FOR NEXT SESSION:  ***   Benito Mccreedy, OTR/L, CHT 04/27/2022, 4:12 PM

## 2022-04-29 ENCOUNTER — Inpatient Hospital Stay: Payer: Medicare Other | Admitting: Neurology

## 2022-04-29 ENCOUNTER — Ambulatory Visit: Payer: Medicare Other | Admitting: Rehabilitative and Restorative Service Providers"

## 2022-04-29 ENCOUNTER — Observation Stay (HOSPITAL_COMMUNITY)
Admission: EM | Admit: 2022-04-29 | Discharge: 2022-05-01 | Disposition: A | Discharge status: Home / Self Care | Payer: Medicare Other | Attending: Internal Medicine | Admitting: Internal Medicine

## 2022-04-29 ENCOUNTER — Other Ambulatory Visit: Payer: Self-pay

## 2022-04-29 ENCOUNTER — Encounter (HOSPITAL_COMMUNITY): Payer: Self-pay

## 2022-04-29 ENCOUNTER — Ambulatory Visit: Payer: Medicare Other | Admitting: Physical Therapy

## 2022-04-29 ENCOUNTER — Emergency Department (HOSPITAL_COMMUNITY): Payer: Medicare Other

## 2022-04-29 DIAGNOSIS — Z992 Dependence on renal dialysis: Secondary | ICD-10-CM

## 2022-04-29 DIAGNOSIS — I161 Hypertensive emergency: Secondary | ICD-10-CM | POA: Diagnosis not present

## 2022-04-29 DIAGNOSIS — R0602 Shortness of breath: Secondary | ICD-10-CM | POA: Diagnosis present

## 2022-04-29 DIAGNOSIS — N189 Chronic kidney disease, unspecified: Secondary | ICD-10-CM | POA: Diagnosis present

## 2022-04-29 DIAGNOSIS — I480 Paroxysmal atrial fibrillation: Secondary | ICD-10-CM | POA: Diagnosis present

## 2022-04-29 DIAGNOSIS — I5022 Chronic systolic (congestive) heart failure: Secondary | ICD-10-CM | POA: Diagnosis not present

## 2022-04-29 DIAGNOSIS — R0603 Acute respiratory distress: Secondary | ICD-10-CM | POA: Diagnosis present

## 2022-04-29 DIAGNOSIS — Z8673 Personal history of transient ischemic attack (TIA), and cerebral infarction without residual deficits: Secondary | ICD-10-CM | POA: Insufficient documentation

## 2022-04-29 DIAGNOSIS — Z853 Personal history of malignant neoplasm of breast: Secondary | ICD-10-CM | POA: Insufficient documentation

## 2022-04-29 DIAGNOSIS — E872 Acidosis, unspecified: Secondary | ICD-10-CM | POA: Diagnosis not present

## 2022-04-29 DIAGNOSIS — E875 Hyperkalemia: Secondary | ICD-10-CM | POA: Insufficient documentation

## 2022-04-29 DIAGNOSIS — D649 Anemia, unspecified: Secondary | ICD-10-CM | POA: Insufficient documentation

## 2022-04-29 DIAGNOSIS — Z1152 Encounter for screening for COVID-19: Secondary | ICD-10-CM | POA: Diagnosis not present

## 2022-04-29 DIAGNOSIS — J449 Chronic obstructive pulmonary disease, unspecified: Secondary | ICD-10-CM | POA: Diagnosis not present

## 2022-04-29 DIAGNOSIS — I132 Hypertensive heart and chronic kidney disease with heart failure and with stage 5 chronic kidney disease, or end stage renal disease: Secondary | ICD-10-CM | POA: Insufficient documentation

## 2022-04-29 DIAGNOSIS — Z862 Personal history of diseases of the blood and blood-forming organs and certain disorders involving the immune mechanism: Secondary | ICD-10-CM | POA: Diagnosis present

## 2022-04-29 DIAGNOSIS — N186 End stage renal disease: Secondary | ICD-10-CM | POA: Diagnosis not present

## 2022-04-29 DIAGNOSIS — D631 Anemia in chronic kidney disease: Secondary | ICD-10-CM | POA: Diagnosis present

## 2022-04-29 DIAGNOSIS — Z79899 Other long term (current) drug therapy: Secondary | ICD-10-CM | POA: Diagnosis not present

## 2022-04-29 DIAGNOSIS — J9601 Acute respiratory failure with hypoxia: Secondary | ICD-10-CM | POA: Diagnosis not present

## 2022-04-29 LAB — RESP PANEL BY RT-PCR (FLU A&B, COVID) ARPGX2
Influenza A by PCR: NEGATIVE
Influenza B by PCR: NEGATIVE
SARS Coronavirus 2 by RT PCR: NEGATIVE

## 2022-04-29 LAB — CBC WITH DIFFERENTIAL/PLATELET
Abs Immature Granulocytes: 0.03 10*3/uL (ref 0.00–0.07)
Basophils Absolute: 0 10*3/uL (ref 0.0–0.1)
Basophils Relative: 0 %
Eosinophils Absolute: 0.1 10*3/uL (ref 0.0–0.5)
Eosinophils Relative: 1 %
HCT: 26.7 % — ABNORMAL LOW (ref 36.0–46.0)
Hemoglobin: 8.6 g/dL — ABNORMAL LOW (ref 12.0–15.0)
Immature Granulocytes: 0 %
Lymphocytes Relative: 6 %
Lymphs Abs: 0.5 10*3/uL — ABNORMAL LOW (ref 0.7–4.0)
MCH: 31.7 pg (ref 26.0–34.0)
MCHC: 32.2 g/dL (ref 30.0–36.0)
MCV: 98.5 fL (ref 80.0–100.0)
Monocytes Absolute: 0.7 10*3/uL (ref 0.1–1.0)
Monocytes Relative: 7 %
Neutro Abs: 8.2 10*3/uL — ABNORMAL HIGH (ref 1.7–7.7)
Neutrophils Relative %: 86 %
Platelets: 249 10*3/uL (ref 150–400)
RBC: 2.71 MIL/uL — ABNORMAL LOW (ref 3.87–5.11)
RDW: 17.2 % — ABNORMAL HIGH (ref 11.5–15.5)
WBC: 9.5 10*3/uL (ref 4.0–10.5)
nRBC: 0 % (ref 0.0–0.2)

## 2022-04-29 LAB — COMPREHENSIVE METABOLIC PANEL
ALT: 15 U/L (ref 0–44)
AST: 15 U/L (ref 15–41)
Albumin: 3.8 g/dL (ref 3.5–5.0)
Alkaline Phosphatase: 82 U/L (ref 38–126)
Anion gap: 19 — ABNORMAL HIGH (ref 5–15)
BUN: 118 mg/dL — ABNORMAL HIGH (ref 8–23)
CO2: 20 mmol/L — ABNORMAL LOW (ref 22–32)
Calcium: 9.6 mg/dL (ref 8.9–10.3)
Chloride: 101 mmol/L (ref 98–111)
Creatinine, Ser: 15.48 mg/dL — ABNORMAL HIGH (ref 0.44–1.00)
GFR, Estimated: 2 mL/min — ABNORMAL LOW (ref >60)
Glucose, Bld: 129 mg/dL — ABNORMAL HIGH (ref 70–99)
Potassium: 6.3 mmol/L (ref 3.5–5.1)
Sodium: 140 mmol/L (ref 135–145)
Total Bilirubin: 0.7 mg/dL (ref 0.3–1.2)
Total Protein: 7.4 g/dL (ref 6.5–8.1)

## 2022-04-29 LAB — TROPONIN I (HIGH SENSITIVITY): Troponin I (High Sensitivity): 166 ng/L (ref <18)

## 2022-04-29 LAB — OCCULT BLOOD X 1 CARD TO LAB, STOOL: Fecal Occult Bld: NEGATIVE

## 2022-04-29 MED ORDER — CARVEDILOL 12.5 MG PO TABS: 12.5000 mg | ORAL_TABLET | Freq: Two times a day (BID) | ORAL | Status: DC

## 2022-04-29 MED ORDER — NITROGLYCERIN 0.4 MG SL SUBL
0.4000 mg | SUBLINGUAL_TABLET | SUBLINGUAL | Status: DC | PRN
  Administered 2022-04-29: 0.4 mg via SUBLINGUAL
  Filled 2022-04-29 (×2): qty 1

## 2022-04-29 MED ORDER — HYDRALAZINE HCL 25 MG PO TABS: 25.0000 mg | ORAL_TABLET | Freq: Two times a day (BID) | ORAL | Status: DC

## 2022-04-29 MED ORDER — ACETAMINOPHEN 650 MG RE SUPP: 650.0000 mg | Freq: Four times a day (QID) | RECTAL | Status: DC | PRN

## 2022-04-29 MED ORDER — HEPARIN SODIUM (PORCINE) 1000 UNIT/ML DIALYSIS
1000.0000 [IU] | INTRAMUSCULAR | Status: DC | PRN
  Administered 2022-04-30: 1000 [IU]
  Filled 2022-04-29 (×4): qty 1

## 2022-04-29 MED ORDER — CLONIDINE HCL 0.1 MG/24HR TD PTWK: 0.1000 mg | MEDICATED_PATCH | TRANSDERMAL | Status: DC

## 2022-04-29 MED ORDER — ACETAMINOPHEN 325 MG PO TABS: 650.0000 mg | ORAL_TABLET | Freq: Four times a day (QID) | ORAL | Status: DC | PRN

## 2022-04-29 MED ORDER — ISOSORBIDE MONONITRATE ER 30 MG PO TB24
30.0000 mg | ORAL_TABLET | Freq: Every day | ORAL | Status: DC
  Administered 2022-04-30 – 2022-05-01 (×2): 30 mg via ORAL
  Filled 2022-04-29 (×2): qty 1

## 2022-04-29 MED ORDER — NITROGLYCERIN IN D5W 200-5 MCG/ML-% IV SOLN
0.0000 ug/min | INTRAVENOUS | Status: DC
  Administered 2022-04-29: 5 ug/min via INTRAVENOUS
  Filled 2022-04-29: qty 250

## 2022-04-29 MED ORDER — LIDOCAINE-PRILOCAINE 2.5-2.5 % EX CREA
1.0000 | TOPICAL_CREAM | CUTANEOUS | Status: DC | PRN
  Filled 2022-04-29: qty 5

## 2022-04-29 MED ORDER — SEVELAMER CARBONATE 2.4 G PO PACK
2.4000 g | PACK | Freq: Three times a day (TID) | ORAL | Status: DC
  Administered 2022-04-30 – 2022-05-01 (×2): 2.4 g via ORAL
  Filled 2022-04-29 (×7): qty 1

## 2022-04-29 MED ORDER — PENTAFLUOROPROP-TETRAFLUOROETH EX AERO
1.0000 | INHALATION_SPRAY | CUTANEOUS | Status: DC | PRN
  Filled 2022-04-29: qty 30

## 2022-04-29 MED ORDER — LIDOCAINE HCL (PF) 1 % IJ SOLN: 5.0000 mL | INTRAMUSCULAR | Status: DC | PRN

## 2022-04-29 MED ORDER — ALTEPLASE 2 MG IJ SOLR: 2.0000 mg | Freq: Once | INTRAMUSCULAR | Status: DC | PRN

## 2022-04-29 MED ORDER — SODIUM ZIRCONIUM CYCLOSILICATE 10 G PO PACK
10.0000 g | PACK | Freq: Once | ORAL | Status: AC
  Administered 2022-04-29: 10 g via ORAL
  Filled 2022-04-29: qty 1

## 2022-04-29 MED ORDER — ANTICOAGULANT SODIUM CITRATE 4% (200MG/5ML) IV SOLN
5.0000 mL | Status: DC | PRN
  Filled 2022-04-29: qty 5

## 2022-04-29 MED ORDER — AMLODIPINE BESYLATE 10 MG PO TABS
10.0000 mg | ORAL_TABLET | Freq: Every day | ORAL | Status: DC
  Administered 2022-04-30 – 2022-05-01 (×2): 10 mg via ORAL
  Filled 2022-04-29 (×2): qty 1

## 2022-04-29 MED ORDER — ALBUTEROL SULFATE (2.5 MG/3ML) 0.083% IN NEBU: 10.0000 mg | INHALATION_SOLUTION | Freq: Once | RESPIRATORY_TRACT | Status: DC

## 2022-04-29 MED ORDER — ROSUVASTATIN CALCIUM 5 MG PO TABS
10.0000 mg | ORAL_TABLET | Freq: Every day | ORAL | Status: DC
  Administered 2022-04-30 – 2022-05-01 (×2): 10 mg via ORAL
  Filled 2022-04-29 (×2): qty 2

## 2022-04-29 MED ORDER — APIXABAN 2.5 MG PO TABS
2.5000 mg | ORAL_TABLET | Freq: Two times a day (BID) | ORAL | Status: DC
  Administered 2022-04-30 – 2022-05-01 (×3): 2.5 mg via ORAL
  Filled 2022-04-29 (×3): qty 1

## 2022-04-29 NOTE — ED Provider Notes (Signed)
[facility] EMERGENCY DEPARTMENT Provider Note   CSN: [id number] Arrival date & time: 04/29/22  1828     History  Chief Complaint  Patient presents with   Missed Dialysis   Chest Pain    Monique Henderson is a 71 y.o. female.   Chest Pain Patient presents with chest pain.  Anterior chest.  Has been there since yesterday.  Has been somewhat constant but got worse today.  Missed dialysis yesterday.  Was dialyzed last on Saturday with yesterday being Tuesday.  Has had previous stroke.  No fevers or chills.  No cough.  Had been given aspirin by EMS.  Occasional cough also.  No fevers.    Past Medical History:  Diagnosis Date   AF (paroxysmal atrial fibrillation) ([place]) 05/29/2019   on Coumadin   Aortic atherosclerosis ([place]) 07/05/2019   Aortic dissection ([place]) 04/04/2019   s/p repair   Bone spur 2008   Right calcaneal foot spur   Breast cancer ([place]) 2004   Ductal carcinoma in situ of the left breast; S/P left partial mastectomy 02/26/2003; S/P re-excision of cranial and lateral margins11/18/2004.radiation   Cerebral thrombosis with cerebral infarction 05/22/2019   Chronic low back pain 06/22/2016   Chronic obstructive lung disease ([place]) 01/16/2017   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 Autumn Messing; S/P re-excision of superior and inferior margins 10/27/2012.    ESRD on hemodialysis ([place]) 05/29/2019   Essential hypertension 09/16/2006   GERD 09/16/2006   Hepatitis C    treated and RNA confirmed not detectable 01/2017   Insomnia 03/14/2015   Malnutrition of moderate degree 05/19/2019   Non compliance with medical treatment 12/04/2017   Normocytic anemia    With thrombocytosis   Osteoarthritis    Right ureteral stone 2002   S/P lumbar spinal fusion 01/18/2014   S/P lumbar decompressive laminectomy, fusion, and plating for lumbar spinal stenosis on 05/27/2009 by Dr. Eustace Moore.  S/P  anterolateral retroperitoneal interbody fusion L2-3 utilizing a 8 mm peek interbody cage packed with morcellized allograft, and anterior lumbar plating L2-3 for recurrent disc herniation L2-3 with spinal stenosis on 01/18/2014 by Dr. Eustace Moore.     Tobacco use disorder 04/19/2009   Uterine fibroid    Wears dentures    top    Home Medications Prior to Admission medications   Medication Sig Start Date End Date Taking? Authorizing Provider  albuterol (VENTOLIN HFA) 108 (90 Base) MCG/ACT inhaler Inhale 2 puffs into the lungs every 6 (six) hours as needed for wheezing or shortness of breath. 07/30/20   Maximiano Coss, NP  amLODipine (NORVASC) 10 MG tablet Take 1 tablet (10 mg total) by mouth daily. 03/06/22   Setzer, Edman Circle, PA-C  apixaban (ELIQUIS) 2.5 MG TABS tablet Take 1 tablet (2.5 mg total) by mouth 2 (two) times daily. 03/06/22   Setzer, Edman Circle, PA-C  carvedilol (COREG) 12.5 MG tablet Take 1 tablet (12.5 mg total) by mouth 2 (two) times daily with a meal. 04/08/22 04/09/23  Bhagat, Crista Luria, PA  Cholecalciferol (VITAMIN D-3 PO) Take 1 capsule by mouth daily.    [provider]  cloNIDine (CATAPRES - DOSED IN MG/24 HR) 0.1 mg/24hr patch Place 1 patch (0.1 mg total) onto the skin every Saturday. 03/07/22   Setzer, Edman Circle, PA-C  diphenhydrAMINE-zinc acetate (BENADRYL) cream Apply topically 3 (three) times daily as needed for itching. 03/06/22   Setzer, Edman Circle, PA-C  hydrALAZINE (  APRESOLINE) 25 MG tablet Take 25 mg by mouth 2 (two) times daily.    [provider]  isosorbide mononitrate (IMDUR) 30 MG 24 hr tablet Take 1 tablet (30 mg total) by mouth daily. 04/20/22   Dorna Mai, MD  lidocaine (LIDODERM) 5 % Place 1 patch onto the skin daily. Remove & Discard patch within 12 hours or as directed by MD Patient not taking: Reported on 04/13/2022 03/07/22   Vaughan Basta, Edman Circle, PA-C  naloxone [facility]) nasal spray 4 mg/0.1 mL 1 spray as directed. 02/19/22   [provider]  oxyCODONE-acetaminophen (PERCOCET) 10-325 MG tablet Take 1 tablet by mouth 3 (three) times daily as needed. 03/12/22   [provider]  rosuvastatin (CRESTOR) 10 MG tablet Take 1 tablet (10 mg total) by mouth daily. 04/20/22   Dorna Mai, MD  sevelamer carbonate (RENVELA) 2.4 g PACK Take 2.4 g by mouth 3 (three) times daily with meals. 03/06/22   Setzer, Edman Circle, PA-C  traZODone (DESYREL) 50 MG tablet Take 0.5 tablets (25 mg total) by mouth at bedtime as needed for sleep. 04/20/22   Dorna Mai, MD      Allergies    Shrimp [shellfish allergy], Bactroban [mupirocin], Chlorhexidine gluconate, Tylenol [acetaminophen], and Zestril [lisinopril]    Review of Systems   Review of Systems  Cardiovascular:  Positive for chest pain.    Physical Exam Updated Vital Signs BP (!) 214/118 (BP Location: Left Arm)   Pulse 95   Resp (!) 27   SpO2 95%  Physical Exam Vitals and nursing note reviewed.  Cardiovascular:     Rate and Rhythm: Regular rhythm.  Pulmonary:     Breath sounds: No wheezing or rhonchi.  Chest:     Chest wall: No tenderness.  Abdominal:     Tenderness: There is no abdominal tenderness.  Musculoskeletal:     Right lower leg: No edema.  Skin:    General: Skin is warm.  Neurological:     Mental Status: She is alert.     ED Results / Procedures / Treatments   Labs (all labs ordered are listed, but only abnormal results are displayed) Labs Reviewed  COMPREHENSIVE METABOLIC PANEL - Abnormal; Notable for the following components:      Result Value   Potassium 6.3 (*)    CO2 20 (*)    Glucose, Bld 129 (*)    BUN 118 (*)    Creatinine, Ser 15.48 (*)    GFR, Estimated 2 (*)    Anion gap 19 (*)    All other components within normal limits  CBC WITH DIFFERENTIAL/PLATELET - Abnormal; Notable for the following components:   RBC 2.71 (*)    Hemoglobin 8.6 (*)    HCT 26.7 (*)    RDW 17.2 (*)    Neutro Abs 8.2 (*)    Lymphs Abs 0.5 (*)     All other components within normal limits  TROPONIN I (HIGH SENSITIVITY) - Abnormal; Notable for the following components:   Troponin I (High Sensitivity) 166 (*)    All other components within normal limits  RESP PANEL BY RT-PCR (FLU A&B, COVID) ARPGX2  HEPATITIS B SURFACE ANTIGEN  HEPATITIS B SURFACE ANTIBODY, QUANTITATIVE  POC OCCULT BLOOD, ED  TROPONIN I (HIGH SENSITIVITY)    EKG EKG Interpretation  Date/Time:  Wednesday April 29 2022 19:56:35 EST Ventricular Rate:  95 PR Interval:  170 QRS Duration: 136 QT Interval:  414 QTC Calculation: 520 R Axis:   80 Text Interpretation:  Sinus rhythm with occasional Premature ventricular complexes Possible Left atrial enlargement Right bundle branch block Abnormal ECG When compared with ECG of 26-Feb-2022 15:47, No significant change since last tracing Confirmed by Davonna Belling [phone]) on 04/29/2022 8:08:39 PM  Radiology DG Chest Portable 1 View  Result Date: 04/29/2022 CLINICAL DATA:  Worsening chest pain EXAM: PORTABLE CHEST 1 VIEW COMPARISON:  02/24/2022 FINDINGS: Single frontal view of the chest demonstrates stable right internal jugular dialysis catheter. Cardiac silhouette remains enlarged. There is increased central vascular congestion, with bilateral interstitial and ground-glass opacities consistent with fluid overload. No effusion or pneumothorax. No acute bony abnormalities. IMPRESSION: 1. Findings consistent with volume overload and developing pulmonary edema. Electronically Signed   By: Randa Ngo M.D.   On: 04/29/2022 19:17    Procedures Procedures    Medications Ordered in ED Medications  nitroGLYCERIN (NITROSTAT) SL tablet 0.4 mg (0.4 mg Sublingual Given 04/29/22 2016)  nitroGLYCERIN 50 mg in dextrose 5 % 250 mL (0.2 mg/mL) infusion (5 mcg/min Intravenous New Bag/Given 04/29/22 2149)  sodium zirconium cyclosilicate (LOKELMA) packet 10 g (has no administration in time range)  albuterol (PROVENTIL) (2.5 MG/3ML)  0.083% nebulizer solution 10 mg (has no administration in time range)    ED Course/ Medical Decision Making/ A&P                           Medical Decision Making Amount and/or Complexity of Data Reviewed Labs: ordered. Radiology: ordered.  Risk Prescription drug management.   Patient with chest pain.  Is had since yesterday.  Missed yesterday's dialysis last dialyzed 4 days ago.  Is somewhat hypertensive.  EKG still pending.  X-ray does show some volume overload.  Independently interpreted x-ray myself.  EKG stable from prior.  Interpreted by me.  Potassium is mildly elevated.  X-ray shows some early volume overload.  He is somewhat hypotensive with pressures over 809X systolic over 833 diastolic.  Felt somewhat better after nitroglycerin and will now give nitroglycerin to help control blood pressure.  Troponin mildly elevated at 160s but she has been near this level in the past.  Already on anticoagulation.  However does have anemia.  We will check Hemoccult but will require admission to hospital.  Discussed with Dr. Posey Pronto who will arrange for dialysis.  Rectal exam showed brown stool.  Doubt the chest pain is due to aortic dissection.  Most likely secondary to need for dialysis and hypertension.  CRITICAL CARE Performed by: Davonna Belling Total critical care time: 30 minutes Critical care time was exclusive of separately billable procedures and treating other patients. Critical care was necessary to treat or prevent imminent or life-threatening deterioration. Critical care was time spent personally by me on the following activities: development of treatment plan with patient and/or surrogate as well as nursing, discussions with consultants, evaluation of patient's response to treatment, examination of patient, obtaining history from patient or surrogate, ordering and performing treatments and interventions, ordering and review of laboratory studies, ordering and review of  radiographic studies, pulse oximetry and re-evaluation of patient's condition.         Final Clinical Impression(s) / ED Diagnoses Final diagnoses:  End stage renal disease on dialysis [facility])  Hyperkalemia  Hypertensive emergency    Rx / [place] Orders ED Discharge Orders     None         Davonna Belling, MD 04/29/22 2233

## 2022-04-29 NOTE — H&P (Addendum)
[facility] History and Physical  Monique Henderson LPF:[id number] DOB: 1951-05-27 DOA: 04/29/2022   PCP: Dorna Mai, MD  Specialists: Nephrology  Chief Complaint: Shortness of breath  HPI: Monique Henderson is a 71 y.o. female with a past medical history of end-stage renal disease on dialysis on Tuesday Thursday Saturday, hypertension, paroxysmal atrial fibrillation on Eliquis, history of aortic dissection status post repair, recent hospitalization in September for stroke followed by stay in inpatient rehabilitation, chronic systolic CHF with a EF of 30 to 35% who was in her usual state of health till earlier today when she started developing shortness of breath.  Patient had a shorter than usual dialysis session last Saturday and then she missed her dialysis session on Tuesday.  Patient noted to be somewhat distracted and confused.  History is not reliable.  Patient mentions that she missed her dialysis session as her right hand was hurting.  She has paralysis of her right upper extremity as a result of her recent stroke.  Patient denies any chest pain.  No nausea or vomiting recently.  No fever or chills.    In the emergency department she was noted to be quite hypertensive.  She was hypoxic.  Chest x-ray showed pulmonary edema.  She was noted to be hyperkalemic.  Nephrology was consulted.  Patient will be hospitalized for further management.  Home Medications: Prior to Admission medications   Medication Sig Start Date End Date Taking? Authorizing Provider  albuterol (VENTOLIN HFA) 108 (90 Base) MCG/ACT inhaler Inhale 2 puffs into the lungs every 6 (six) hours as needed for wheezing or shortness of breath. 07/30/20   Maximiano Coss, NP  amLODipine (NORVASC) 10 MG tablet Take 1 tablet (10 mg total) by mouth daily. 03/06/22   Setzer, Edman Circle, PA-C  apixaban (ELIQUIS) 2.5 MG TABS tablet Take 1 tablet (2.5 mg total) by mouth 2 (two) times daily. 03/06/22   Setzer, Edman Circle, PA-C   carvedilol (COREG) 12.5 MG tablet Take 1 tablet (12.5 mg total) by mouth 2 (two) times daily with a meal. 04/08/22 04/09/23  Bhagat, Crista Luria, PA  Cholecalciferol (VITAMIN D-3 PO) Take 1 capsule by mouth daily.    [provider]  cloNIDine (CATAPRES - DOSED IN MG/24 HR) 0.1 mg/24hr patch Place 1 patch (0.1 mg total) onto the skin every Saturday. 03/07/22   Setzer, Edman Circle, PA-C  diphenhydrAMINE-zinc acetate (BENADRYL) cream Apply topically 3 (three) times daily as needed for itching. 03/06/22   Setzer, Edman Circle, PA-C  hydrALAZINE (APRESOLINE) 25 MG tablet Take 25 mg by mouth 2 (two) times daily.    [provider]  isosorbide mononitrate (IMDUR) 30 MG 24 hr tablet Take 1 tablet (30 mg total) by mouth daily. 04/20/22   Dorna Mai, MD  lidocaine (LIDODERM) 5 % Place 1 patch onto the skin daily. Remove & Discard patch within 12 hours or as directed by MD Patient not taking: Reported on 04/13/2022 03/07/22   Vaughan Basta, Edman Circle, PA-C  naloxone [facility]) nasal spray 4 mg/0.1 mL 1 spray as directed. 02/19/22   [provider]  oxyCODONE-acetaminophen (PERCOCET) 10-325 MG tablet Take 1 tablet by mouth 3 (three) times daily as needed. 03/12/22   [provider]  rosuvastatin (CRESTOR) 10 MG tablet Take 1 tablet (10 mg total) by mouth daily. 04/20/22   Dorna Mai, MD  sevelamer carbonate (RENVELA) 2.4 g PACK Take 2.4 g by mouth 3 (three) times daily with meals. 03/06/22   Setzer, Edman Circle, PA-C  traZODone (DESYREL) 50  MG tablet Take 0.5 tablets (25 mg total) by mouth at bedtime as needed for sleep. 04/20/22   Dorna Mai, MD    Allergies:  Allergies  Allergen Reactions   Shrimp [Shellfish Allergy] Shortness Of Breath   Bactroban [Mupirocin] Other (See Comments)    "Sores in nose"   Chlorhexidine Gluconate Itching   Tylenol [Acetaminophen] Itching   Zestril [Lisinopril] Cough    Past Medical History: Past Medical History:  Diagnosis Date   AF (paroxysmal  atrial fibrillation) ([place]) 05/29/2019   on Coumadin   Aortic atherosclerosis ([place]) 07/05/2019   Aortic dissection ([place]) 04/04/2019   s/p repair   Bone spur 2008   Right calcaneal foot spur   Breast cancer ([place]) 2004   Ductal carcinoma in situ of the left breast; S/P left partial mastectomy 02/26/2003; S/P re-excision of cranial and lateral margins11/18/2004.radiation   Cerebral thrombosis with cerebral infarction 05/22/2019   Chronic low back pain 06/22/2016   Chronic obstructive lung disease ([place]) 01/16/2017   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 Autumn Messing; S/P re-excision of superior and inferior margins 10/27/2012.    ESRD on hemodialysis ([place]) 05/29/2019   Essential hypertension 09/16/2006   GERD 09/16/2006   Hepatitis C    treated and RNA confirmed not detectable 01/2017   Insomnia 03/14/2015   Malnutrition of moderate degree 05/19/2019   Non compliance with medical treatment 12/04/2017   Normocytic anemia    With thrombocytosis   Osteoarthritis    Right ureteral stone 2002   S/P lumbar spinal fusion 01/18/2014   S/P lumbar decompressive laminectomy, fusion, and plating for lumbar spinal stenosis on 05/27/2009 by Dr. Eustace Moore.  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 Eustace Moore.     Tobacco use disorder 04/19/2009   Uterine fibroid    Wears dentures    top    Past Surgical History:  Procedure Laterality Date   ANTERIOR LAT LUMBAR FUSION N/A 01/18/2014   Procedure: ANTERIOR LATERAL LUMBAR FUSION LUMBAR TWO-THREE;  Surgeon: Eustace Moore, MD;  Location: [place] [facility];  Service: Neurosurgery;  Laterality: N/A;   ANTERIOR LUMBAR FUSION  01/18/2014   AV FISTULA PLACEMENT Left 04/20/2019   Procedure: ARTERIOVENOUS  (AV) FISTULA CREATION;  Surgeon: [name], MD;  Location: [place];  Service: Vascular;  Laterality: Left;   BACK SURGERY     BREAST LUMPECTOMY Left 01/2003   BREAST LUMPECTOMY Right 2014   BREAST LUMPECTOMY WITH NEEDLE LOCALIZATION AND AXILLARY SENTINEL LYMPH NODE BX Right 10/13/2012   Procedure: BREAST LUMPECTOMY WITH NEEDLE LOCALIZATION;  Surgeon: [name], MD;  Location: [place];  Service: General;  Laterality: Right;  Right breast wire localized lumpectomy   INSERTION OF DIALYSIS CATHETER Right 04/20/2019   Procedure: INSERTION OF DIALYSIS CATHETER, right internal jugular;  Surgeon: [name], MD;  Location: [place];  Service: Vascular;  Laterality: Right;   INSERTION OF DIALYSIS CATHETER Right 09/24/2020   Procedure: INSERTION OF TUNNELED DIALYSIS CATHETER;  Surgeon: Rosetta Posner, MD;  Location: [facility];  Service: Vascular;  Laterality: Right;   IR FLUORO GUIDE CV LINE RIGHT  09/22/2020   IR THORACENTESIS ASP PLEURAL SPACE W/IMG GUIDE  05/19/2019   IR US GUIDE VASC ACCESS LEFT  09/22/2020  IR US GUIDE VASC ACCESS RIGHT  09/22/2020   IR VENOCAVAGRAM SVC  09/22/2020   LAMINECTOMY  05/27/2009   Lumbar decompressive laminectomy, fusion and plating for lumbar spinal stensosis   LIGATION OF ARTERIOVENOUS  FISTULA Left 09/24/2020   Procedure: LIGATION OF LEFT ARM ARTERIOVENOUS  FISTULA;  Surgeon: Rosetta Posner, MD;  Location: [place];  Service: Vascular;  Laterality: Left;   LUMBAR LAMINECTOMY/DECOMPRESSION MICRODISCECTOMY Left 03/23/2013   Procedure: LUMBAR LAMINECTOMY/DECOMPRESSION MICRODISCECTOMY 1 LEVEL;  Surgeon: Eustace Moore, MD;  Location: [place] [facility];  Service: Neurosurgery;  Laterality: Left;  LUMBAR LAMINECTOMY/DECOMPRESSION MICRODISCECTOMY 1 LEVEL   MASTECTOMY, PARTIAL Left 02/26/2003   ; S/P re-excision of cranial and lateral margins 04/19/2003.    RE-EXCISION OF BREAST CANCER,SUPERIOR MARGINS Right 10/27/2012   Procedure: RE-EXCISION OF  BREAST CANCER,SUPERIOR and inferior MARGINS;  Surgeon: [name], MD;  Location: [place];  Service: General;  Laterality: Right;   RE-EXCISION OF BREAST LUMPECTOMY Left 04/2003   TEE WITHOUT CARDIOVERSION N/A 04/04/2019   Procedure: Transesophageal Echocardiogram (Tee);  Surgeon: Wonda Olds, MD;  Location: [place];  Service: Open Heart Surgery;  Laterality: N/A;   THORACIC AORTIC ANEURYSM REPAIR N/A 04/04/2019   Procedure: THORACIC ASCENDING ANEURYSM REPAIR (AAA)  USING 28 MM X 30 CM HEMASHIELD PLATINUM VASCULAR GRAFT;  Surgeon: Wonda Olds, MD;  Location: [facility];  Service: Open Heart Surgery;  Laterality: N/A;    Social History: Unable to obtain due to lethargy  Family History:  Family History  Problem Relation Age of Onset   Colon cancer Mother 71   Hypertension Mother    Diabetes Sister 24   Hypertension Sister    Diabetes Brother    Hypertension Brother    Diabetes Brother    Hypertension Brother    Kidney disease Son        s/p renal transplant   Hypertension Son    Diabetes Son    Multiple sclerosis Son    Bone cancer Sister 38   Breast cancer Neg Hx    Cervical cancer Neg Hx      Review of Systems -unable to do due to lethargy/confusion  Physical Examination  Vitals:   04/29/22 2000 04/29/22 2115 04/29/22 2150 04/29/22 2254  BP: (!) 214/186 (!) 200/112 (!) 214/118   Pulse: (!) 102 99 95   Resp: (!) 29 (!) 28 (!) 27   Temp:    98 F (36.7 C)  TempSrc:    Oral  SpO2: 95% 93% 95%     BP (!) 214/118 (BP Location: Left Arm)   Pulse 95   Temp 98 F (36.7 C) (Oral)   Resp (!) 27   SpO2 95%   General appearance: alert, cooperative, distracted, and no distress Head: Normocephalic, without obvious abnormality, atraumatic Eyes: conjunctivae/corneas clear. PERRL, EOM's intact.  Throat: lips, mucosa, and tongue normal; teeth and gums normal Neck: no adenopathy, no carotid bruit, no JVD, supple, symmetrical, trachea midline, and thyroid not enlarged,  symmetric, no tenderness/mass/nodules Resp: Tachypneic.  Crackles bilaterally.  No wheezing or rhonchi. Cardio: regular rate and rhythm, S1, S2 normal, no murmur, click, rub or gallop GI: soft, non-tender; bowel sounds normal; no masses,  no organomegaly Extremities: extremities normal, atraumatic, no cyanosis or edema and wraps noted on right hand Pulses: 2+ and symmetric Skin: Skin color, texture, turgor normal. No rashes or lesions Lymph nodes: Cervical, supraclavicular, and axillary nodes normal. Neurologic: Lethargic but arousable.  Right arm weakness is noted from recent  stroke.   Labs on Admission: I have personally reviewed following labs and imaging studies  CBC: Recent Labs  Lab 04/29/22 2039  WBC 9.5  NEUTROABS 8.2*  HGB 8.6*  HCT 26.7*  MCV 98.5  PLT 287   Basic Metabolic Panel: Recent Labs  Lab 04/29/22 2039  NA 140  K 6.3*  CL 101  CO2 20*  GLUCOSE 129*  BUN 118*  CREATININE 15.48*  CALCIUM 9.6   GFR: Estimated Creatinine Clearance: 2.7 mL/min (A) (by C-G formula based on SCr of 15.48 mg/dL (H)). Liver Function Tests: Recent Labs  Lab 04/29/22 2039  AST 15  ALT 15  ALKPHOS 82  BILITOT 0.7  PROT 7.4  ALBUMIN 3.8     Radiological Exams on Admission: DG Chest Portable 1 View  Result Date: 04/29/2022 CLINICAL DATA:  Worsening chest pain EXAM: PORTABLE CHEST 1 VIEW COMPARISON:  02/24/2022 FINDINGS: Single frontal view of the chest demonstrates stable right internal jugular dialysis catheter. Cardiac silhouette remains enlarged. There is increased central vascular congestion, with bilateral interstitial and ground-glass opacities consistent with fluid overload. No effusion or pneumothorax. No acute bony abnormalities. IMPRESSION: 1. Findings consistent with volume overload and developing pulmonary edema. Electronically Signed   By: Randa Ngo M.D.   On: 04/29/2022 19:17    My interpretation of Electrocardiogram: Sinus rhythm in the 90s.  PVCs  noted.  Normal axis.  No concerning ST or T wave changes.  RBBB is noted.   Problem List  Principal Problem:   Acute respiratory failure with hypoxia (HCC) Active Problems:   Hypertensive emergency   ESRD (end stage renal disease) on dialysis with hyperkalemia and metabolic acidosis with elevated AG    AF (paroxysmal atrial fibrillation) (HCC)   Anemia   Assessment: This is a for 71 year old African-American female with past medical history as stated earlier who comes in with shortness of breath.  There was some mention of chest pain at the time of presentation but patient denies any chest pain currently.  She was noted to be hypertensive.  Chest x-ray shows pulmonary edema.  Her presentation is thought to be secondary to volume overload from having missed dialysis session on Tuesday and a short dialysis session on Saturday.  Plan:  #1. Acute respiratory failure with hypoxia: This is secondary to volume overload.  Chest x-ray shows pulmonary edema.  Currently requiring [place] oxygen with saturations in the mid 90s.    Plan is for dialysis tonight.  Hopefully with this her symptoms will improve.  #2.  Hypertensive emergency: This she has a history of poorly controlled hypertension.  We will resume all of her home medications.  She is currently on a nitroglycerin infusion which hopefully can be titrated off after dialysis.  Elevated troponin level likely due to demand ischemia from volume overload.  No ischemic changes noted on EKG.  Denies any chest pain currently.  #3.  Recent stroke: She has right-sided weakness mainly involving her right arm.  Thought to be cardioembolic.  She is already on Eliquis. PT/OT evaluation.  #4.  End-stage renal disease on hemodialysis/hyperkalemia: She has been noncompliant.   She missed her dialysis on Tuesday.  Nephrology to urgently dialyze tonight.  She has a dialysis catheter in the right chest area.  Patient given Lokelma by EDP.  Potassium level should improve  after dialysis.  #5.  Paroxysmal atrial fibrillation: Continue carvedilol and Eliquis.  #6.  Chronic systolic CHF: EF noted to be about 30%.  Volume being managed with dialysis.  #  7. Normocytic anemia/anemia of chronic disease: She does have a history of anemia though her hemoglobin seems to be slightly lower than her usual baseline of around 10.  No evidence of overt bleeding.  Fecal occult blood testing is negative.  Recheck labs tomorrow.  DVT Prophylaxis: On Eliquis Code Status: She was DNR during previous hospitalization.  Patient unable to answer CODE STATUS questions.  Her sister would not want to make this decision for her.  Will continue to address it tomorrow.  Full code for now.   Family Communication:  Discussed with patient's sister Disposition: Hopefully return home in improved Consults called: Nephrology Admission Status: Status is: Observation The patient remains OBS appropriate and will d/c before 2 midnights.    Severity of Illness: The appropriate patient status for this patient is OBSERVATION. Observation status is judged to be reasonable and necessary in order to provide the required intensity of service to ensure the patient's safety. The patient's presenting symptoms, physical exam findings, and initial radiographic and laboratory data in the context of their medical condition is felt to place them at decreased risk for further clinical deterioration. Furthermore, it is anticipated that the patient will be medically stable for discharge from the hospital within 2 midnights of admission.    Further management decisions will depend on results of further testing and patient's response to treatment.   Luka Reisch [employer]  [facility] operational officer on [email or web address]  04/29/2022, 11:15 PM

## 2022-04-29 NOTE — ED Triage Notes (Signed)
Pt BIB [facility] from home d/t CP that started yesterday when she missed dialysis & has progressively gotten worse. EMS reports A/Ox4,  218/118, 104 ST, 163 CBG, 2L O2 96%, 25 resp. Dose have stroke Hx baseline speech deficit. 324 ASA given on scene by fire dept. No PIV, Rt arm restriction.

## 2022-04-29 NOTE — Progress Notes (Signed)
Patient ID: Monique Henderson, female   DOB: May 19, 1951, 71 y.o.   MRN: [id number]  Consulted by Dr.Pickering for hyperkalemia and volume-related malignant hypertension in this TTS ESRD patient who missed her scheduled dialysis treatment on Tuesday 11/28 after truncated treatment on 11/25 (ran 3:25 instead of the prescribed 3:45) leaving 2.6kg over EDW of 47kg.  Hemodialysis will be ordered for tonight to correct hyperkalemia and manage volume/blood pressure. Dialysis RN notified.  Formal consult to follow on 04/30/2022.  Elmarie Shiley MD [facility]. Office # [phone] Pager # [phone] 10:23 PM

## 2022-04-29 NOTE — ED Notes (Signed)
Pt bp elevated notified MD Pickering new orders to be placed

## 2022-04-30 DIAGNOSIS — I5022 Chronic systolic (congestive) heart failure: Secondary | ICD-10-CM | POA: Diagnosis not present

## 2022-04-30 DIAGNOSIS — J9601 Acute respiratory failure with hypoxia: Secondary | ICD-10-CM | POA: Diagnosis not present

## 2022-04-30 DIAGNOSIS — I480 Paroxysmal atrial fibrillation: Secondary | ICD-10-CM | POA: Diagnosis not present

## 2022-04-30 DIAGNOSIS — Z1152 Encounter for screening for COVID-19: Secondary | ICD-10-CM | POA: Diagnosis not present

## 2022-04-30 DIAGNOSIS — N186 End stage renal disease: Secondary | ICD-10-CM | POA: Diagnosis not present

## 2022-04-30 DIAGNOSIS — I161 Hypertensive emergency: Secondary | ICD-10-CM | POA: Diagnosis not present

## 2022-04-30 LAB — TROPONIN I (HIGH SENSITIVITY): Troponin I (High Sensitivity): 171 ng/L (ref <18)

## 2022-04-30 LAB — CBC
HCT: 26.8 % — ABNORMAL LOW (ref 36.0–46.0)
Hemoglobin: 8.7 g/dL — ABNORMAL LOW (ref 12.0–15.0)
MCH: 31 pg (ref 26.0–34.0)
MCHC: 32.5 g/dL (ref 30.0–36.0)
MCV: 95.4 fL (ref 80.0–100.0)
Platelets: 225 10*3/uL (ref 150–400)
RBC: 2.81 MIL/uL — ABNORMAL LOW (ref 3.87–5.11)
RDW: 16.8 % — ABNORMAL HIGH (ref 11.5–15.5)
WBC: 6.2 10*3/uL (ref 4.0–10.5)
nRBC: 0 % (ref 0.0–0.2)

## 2022-04-30 LAB — BASIC METABOLIC PANEL
Anion gap: 19 — ABNORMAL HIGH (ref 5–15)
BUN: 38 mg/dL — ABNORMAL HIGH (ref 8–23)
CO2: 24 mmol/L (ref 22–32)
Calcium: 9.2 mg/dL (ref 8.9–10.3)
Chloride: 95 mmol/L — ABNORMAL LOW (ref 98–111)
Creatinine, Ser: 6.53 mg/dL — ABNORMAL HIGH (ref 0.44–1.00)
GFR, Estimated: 6 mL/min — ABNORMAL LOW (ref >60)
Glucose, Bld: 95 mg/dL (ref 70–99)
Potassium: 3.4 mmol/L — ABNORMAL LOW (ref 3.5–5.1)
Sodium: 138 mmol/L (ref 135–145)

## 2022-04-30 LAB — GLUCOSE, CAPILLARY
Glucose-Capillary: 106 mg/dL — ABNORMAL HIGH (ref 70–99)
Glucose-Capillary: 96 mg/dL (ref 70–99)

## 2022-04-30 LAB — HEPATITIS B SURFACE ANTIGEN: Hepatitis B Surface Ag: NONREACTIVE

## 2022-04-30 MED ORDER — CARVEDILOL 25 MG PO TABS
25.0000 mg | ORAL_TABLET | Freq: Two times a day (BID) | ORAL | Status: DC
  Administered 2022-04-30 – 2022-05-01 (×3): 25 mg via ORAL
  Filled 2022-04-30 (×3): qty 1

## 2022-04-30 MED ORDER — HYDRALAZINE HCL 20 MG/ML IJ SOLN
10.0000 mg | Freq: Once | INTRAMUSCULAR | Status: AC
  Administered 2022-04-30: 10 mg via INTRAVENOUS
  Filled 2022-04-30: qty 1

## 2022-04-30 MED ORDER — CALCIUM CARBONATE ANTACID 500 MG PO CHEW: 1.0000 | CHEWABLE_TABLET | Freq: Two times a day (BID) | ORAL | Status: DC | PRN

## 2022-04-30 MED ORDER — PANTOPRAZOLE SODIUM 40 MG PO TBEC
40.0000 mg | DELAYED_RELEASE_TABLET | Freq: Two times a day (BID) | ORAL | Status: DC
  Administered 2022-04-30 – 2022-05-01 (×3): 40 mg via ORAL
  Filled 2022-04-30 (×3): qty 1

## 2022-04-30 MED ORDER — LABETALOL HCL 5 MG/ML IV SOLN: 10.0000 mg | INTRAVENOUS | Status: DC | PRN

## 2022-04-30 MED ORDER — METOPROLOL TARTRATE 5 MG/5ML IV SOLN: 5.0000 mg | INTRAVENOUS | Status: DC | PRN

## 2022-04-30 MED ORDER — HYDRALAZINE HCL 50 MG PO TABS
50.0000 mg | ORAL_TABLET | Freq: Two times a day (BID) | ORAL | Status: DC
  Administered 2022-04-30 – 2022-05-01 (×3): 50 mg via ORAL
  Filled 2022-04-30 (×3): qty 1

## 2022-04-30 NOTE — Progress Notes (Signed)
PT Cancellation Note  Patient Details Name: Monique Henderson MRN: [id number] DOB: 09/30/1950   Cancelled Treatment:    Reason Eval/Treat Not Completed: Patient not medically ready Per chart, pt with rapid response called this AM & with elevated BP. Will hold PT evaluation at this time & f/u as able.  Lavone Nian, PT, DPT 04/30/22, 10:44 AM  Monique Henderson 04/30/2022, 10:44 AM

## 2022-04-30 NOTE — Progress Notes (Addendum)
PROGRESS NOTE        PATIENT DETAILS Name: Monique Henderson Age: 71 y.o. Sex: female Date of Birth: [birth year] Admit Date: 04/29/2022 Admitting Physician Bonnielee Haff, MD [id number], Clyde Canterbury, MD  Brief Summary: Patient is a 71 y.o.  female with history of ESRD on HD TTS, PAF on Eliquis, recent hospitalization September 8250 for a embolic stroke, chronic HFpEF-who presented with acute hypoxic respiratory failure due to volume overload due to noncompliance with hemodialysis.  Significant events: 11/29>> admit to [facility]-hypoxia-volume overload.  Underwent emergent HD.  Significant studies: 11/29>> CXR: Findings consistent with volume overload/pulm edema.  Significant microbiology data: 11/29>> influenza/COVID PCR: Negative  Procedures: None  Consults: Nephrology  Subjective: Lying comfortably in bed-denies any chest pain or shortness of breath.  Appears to have significant amount of dysarthria-continued right-sided hemiplegia from recent CVA.  Objective: Vitals: Blood pressure (!) 187/96, pulse 97, temperature 98.1 F (36.7 C), temperature source Oral, resp. rate 18, SpO2 95 %.   Exam: Gen Exam:Alert awake-not in any distress HEENT:atraumatic, normocephalic Chest: B/L clear to auscultation anteriorly CVS:S1S2 regular Abdomen:soft non tender, non distended Extremities:no edema Neurology: Right upper extremity>> right lower extremity weakness Skin: no rash  Pertinent Labs/Radiology:    Latest Ref Rng & Units 04/30/2022    6:21 AM 04/29/2022    8:39 PM 03/07/2022    2:52 PM  CBC  WBC 4.0 - 10.5 K/uL 6.2  9.5  3.9   Hemoglobin 12.0 - 15.0 g/dL 8.7  8.6  10.6   Hematocrit 36.0 - 46.0 % 26.8  26.7  32.4   Platelets 150 - 400 K/uL 225  249  242     Lab Results  Component Value Date   NA 140 04/29/2022   K 6.3 ([place]) 04/29/2022   CL 101 04/29/2022   CO2 20 (L) 04/29/2022      Assessment/Plan: Acute hypoxic respiratory failure due to  pulmonary edema/acute on chronic HFrEF in a setting of missed hemodialysis (POA) Significantly better-lying flat this morning.  Underwent urgent HD yesterday Titrate off oxygen Continue to remove volume with HD-Per nephrology note-another HD session planned today.  Hypertensive emergency in the setting of noncompliance to HD-volume overload BP better than yesterday but still on the higher side Increase Coreg/hydralazine dosage Continue Imdur/transdermal clonidine/amlodipine Add as needed labetalol Hopefully with further volume removal with Hd blood pressure will improve Reassess 12/01  ESRD on HD TTS Nephrology following and managing HD care  Normocytic anemia Secondary to CKD/ESRD EPO/Fe deferred to nephrology service  Hyperkalemia Secondary to noncompliance with HD Resolved with HD-in fact now mildly hypokalemic Recheck electrolytes 12/01  PAF with intermittent RVR Some episodes of RVR overnight/this morning Coreg dosage adjusted Continue Eliquis Add as needed IV metoprolol Monitor in telemetry and follow closely  Recent cardioembolic CVA with dysarthria/right residual hemiparesis Supportive care-await PT/OT/SLP eval On Eliquis  Atypical chest pain Occurred overnight/this morning-has had similar issues during prior hospitalization as well Suspect GERD Add PPI Troponins chronically elevated-and similar to prior Follow for now-not a great candidate for further intervention.  History of aortic dissection-s/p repair   Prior history of cocaine use Unclear if she still is doing cocaine-no UDS sent recently.  Underweight: Estimated body mass index is 19.13 kg/m as calculated from the following:   Height as of 04/08/22: '5\' 3"'$  (1.6 m).   Weight as of 04/20/22: 49 kg.  Code status:   Code Status: DNR confirmed with patient-(RN at bedside)-and spoke to son about this on 11/30.  DVT Prophylaxis: apixaban (ELIQUIS) tablet 2.5 mg Start: 04/29/22 2345 apixaban (ELIQUIS)  tablet 2.5 mg    Family Communication: Son-Gattis Brouillard-[phone]-updated 11/30   Disposition Plan: Status is: Observation The patient will require care spanning > 2 midnights and should be moved to inpatient because: Resolving hypoxia/volume overload-additional HD planned-not yet stable for discharge.   Planned Discharge Destination:Home health   Diet: Diet Order             Diet renal with fluid restriction Fluid restriction: 1200 mL Fluid; Room service appropriate? Yes; Fluid consistency: Thin  Diet effective now                     Antimicrobial agents: Anti-infectives (From admission, onward)    None        MEDICATIONS: Scheduled Meds:  albuterol  10 mg Nebulization Once   amLODipine  10 mg Oral Daily   apixaban  2.5 mg Oral BID   carvedilol  25 mg Oral BID WC   cloNIDine  0.1 mg Transdermal Q Sat   hydrALAZINE  50 mg Oral BID   isosorbide mononitrate  30 mg Oral Daily   rosuvastatin  10 mg Oral Daily   sevelamer carbonate  2.4 g Oral TID WC   Continuous Infusions:  anticoagulant sodium citrate     PRN Meds:.alteplase, anticoagulant sodium citrate, heparin, labetalol, lidocaine (PF), lidocaine-prilocaine, nitroGLYCERIN, pentafluoroprop-tetrafluoroeth   I have personally reviewed following labs and imaging studies  LABORATORY DATA: CBC: Recent Labs  Lab 04/29/22 2039 04/30/22 0621  WBC 9.5 6.2  NEUTROABS 8.2*  --   HGB 8.6* 8.7*  HCT 26.7* 26.8*  MCV 98.5 95.4  PLT 249 638    Basic Metabolic Panel: Recent Labs  Lab 04/29/22 2039  NA 140  K 6.3*  CL 101  CO2 20*  GLUCOSE 129*  BUN 118*  CREATININE 15.48*  CALCIUM 9.6    GFR: Estimated Creatinine Clearance: 2.7 mL/min (A) (by C-G formula based on SCr of 15.48 mg/dL (H)).  Liver Function Tests: Recent Labs  Lab 04/29/22 2039  AST 15  ALT 15  ALKPHOS 82  BILITOT 0.7  PROT 7.4  ALBUMIN 3.8   No results for input(s): "LIPASE", "AMYLASE" in the last 168 hours. No  results for input(s): "AMMONIA" in the last 168 hours.  Coagulation Profile: No results for input(s): "INR", "PROTIME" in the last 168 hours.  Cardiac Enzymes: No results for input(s): "CKTOTAL", "CKMB", "CKMBINDEX", "TROPONINI" in the last 168 hours.  BNP (last 3 results) No results for input(s): "PROBNP" in the last 8760 hours.  Lipid Profile: No results for input(s): "CHOL", "HDL", "LDLCALC", "TRIG", "CHOLHDL", "LDLDIRECT" in the last 72 hours.  Thyroid Function Tests: No results for input(s): "TSH", "T4TOTAL", "FREET4", "T3FREE", "THYROIDAB" in the last 72 hours.  Anemia Panel: No results for input(s): "VITAMINB12", "FOLATE", "FERRITIN", "TIBC", "IRON", "RETICCTPCT" in the last 72 hours.  Urine analysis:    Component Value Date/Time   COLORURINE AMBER (A) 12/27/2021 1840   APPEARANCEUR TURBID (A) 12/27/2021 1840   LABSPEC 1.013 12/27/2021 1840   PHURINE 8.0 12/27/2021 1840   GLUCOSEU NEGATIVE 12/27/2021 1840   GLUCOSEU NEG mg/dL 10/30/2009 1944   HGBUR MODERATE (A) 12/27/2021 1840   BILIRUBINUR NEGATIVE 12/27/2021 1840   BILIRUBINUR negative 05/04/2018 0910   KETONESUR NEGATIVE 12/27/2021 1840   PROTEINUR >=300 (A) 12/27/2021 1840   UROBILINOGEN 0.2  05/04/2018 0910   UROBILINOGEN 1 02/14/2014 0946   NITRITE NEGATIVE 12/27/2021 1840   LEUKOCYTESUR LARGE (A) 12/27/2021 1840    Sepsis Labs: Lactic Acid, Venous    Component Value Date/Time   LATICACIDVEN 0.9 06/19/2021 0422    MICROBIOLOGY: Recent Results (from the past 240 hour(s))  Resp Panel by RT-PCR (Flu A&B, Covid) Anterior Nasal Swab     Status: None   Collection Time: 04/29/22  7:02 PM   Specimen: Anterior Nasal Swab  Result Value Ref Range Status   SARS Coronavirus 2 by RT PCR NEGATIVE NEGATIVE Final    Comment: (NOTE) SARS-CoV-2 target nucleic acids are NOT DETECTED.  The SARS-CoV-2 RNA is generally detectable in upper respiratory specimens during the acute phase of infection. The  lowest concentration of SARS-CoV-2 viral copies this assay can detect is 138 copies/mL. A negative result does not preclude SARS-Cov-2 infection and should not be used as the sole basis for treatment or other patient management decisions. A negative result may occur with  improper specimen collection/handling, submission of specimen other than nasopharyngeal swab, presence of viral mutation(s) within the areas targeted by this assay, and inadequate number of viral copies(<138 copies/mL). A negative result must be combined with clinical observations, patient history, and epidemiological information. The expected result is Negative.  Fact Sheet for Patients:  EntrepreneurPulse.com.au  Fact Sheet for Healthcare Providers:  IncredibleEmployment.be  This test is no t yet approved or cleared by the [place] FDA and  has been authorized for detection and/or diagnosis of SARS-CoV-2 by FDA under an Emergency Use Authorization (EUA). This EUA will remain  in effect (meaning this test can be used) for the duration of the COVID-19 declaration under Section 564(b)(1) of the Act, 21 U.S.C.section 360bbb-3(b)(1), unless the authorization is terminated  or revoked sooner.       Influenza A by PCR NEGATIVE NEGATIVE Final   Influenza B by PCR NEGATIVE NEGATIVE Final    Comment: (NOTE) The Xpert Xpress SARS-CoV-2/FLU/RSV plus assay is intended as an aid in the diagnosis of influenza from Nasopharyngeal swab specimens and should not be used as a sole basis for treatment. Nasal washings and aspirates are unacceptable for Xpert Xpress SARS-CoV-2/FLU/RSV testing.  Fact Sheet for Patients: EntrepreneurPulse.com.au  Fact Sheet for Healthcare Providers: IncredibleEmployment.be  This test is not yet approved or cleared by the [place] FDA and has been authorized for detection and/or diagnosis of SARS-CoV-2 by FDA under  an Emergency Use Authorization (EUA). This EUA will remain in effect (meaning this test can be used) for the duration of the COVID-19 declaration under Section 564(b)(1) of the Act, 21 U.S.C. section 360bbb-3(b)(1), unless the authorization is terminated or revoked.  Performed at [facility], [place] [address], [place], [place] [address]     RADIOLOGY STUDIES/RESULTS: DG Chest Portable 1 View  Result Date: 04/29/2022 CLINICAL DATA:  Worsening chest pain EXAM: PORTABLE CHEST 1 VIEW COMPARISON:  02/24/2022 FINDINGS: Single frontal view of the chest demonstrates stable right internal jugular dialysis catheter. Cardiac silhouette remains enlarged. There is increased central vascular congestion, with bilateral interstitial and ground-glass opacities consistent with fluid overload. No effusion or pneumothorax. No acute bony abnormalities. IMPRESSION: 1. Findings consistent with volume overload and developing pulmonary edema. Electronically Signed   By: Randa Ngo M.D.   On: 04/29/2022 19:17     LOS: 0 days   Oren Binet, MD  [facility]    To contact the attending provider between 7A-7P or the covering provider during  after hours 7P-7A, please log into the web site [email or web address] and access using universal [place] password for that web site. If you do not have the password, please call the hospital operator.  04/30/2022, 7:30 AM

## 2022-04-30 NOTE — Progress Notes (Signed)
OT Cancellation Note  Patient Details Name: Monique Henderson MRN: [id number] DOB: 11-06-1950   Cancelled Treatment:    Reason Eval/Treat Not Completed: Medical issues which prohibited therapy (Spoke to nursing and not medical stable and had to call rapid. Will follow up.)  Joeseph Amor OTR/L  Acute Rehab Services  [phone] office number [phone] pager number  Joeseph Amor 04/30/2022, 8:50 AM

## 2022-04-30 NOTE — Progress Notes (Signed)
Received patient in bed to unit.  Alert and oriented.  Informed consent signed and in chart.   Treatment initiated: 1508 Treatment completed: 1806  Patient tolerated well.  Transported back to the room  Alert, without acute distress.  Hand-off given to patient's nurse.   Access used: Catheter Access issues: none  Total UF removed: 1.7L Medication(s) given: none Post HD VS: 160/78,86,97%,23,97.8 Post HD weight: [facility]

## 2022-04-30 NOTE — Plan of Care (Signed)
  Problem: Education: Goal: Knowledge of General Education information will improve Description Including pain rating scale, medication(s)/side effects and non-pharmacologic comfort measures Outcome: Progressing   

## 2022-04-30 NOTE — Evaluation (Signed)
Clinical/Bedside Swallow Evaluation Patient Details  Name: KELLEN HOVER MRN: [id number] Date of Birth: Jul 21, 1950  Today's Date: 04/30/2022 Time: SLP Start Time (ACUTE ONLY): 3818 SLP Stop Time (ACUTE ONLY): 0908 SLP Time Calculation (min) (ACUTE ONLY): 13 min  Past Medical History:  Past Medical History:  Diagnosis Date   AF (paroxysmal atrial fibrillation) ([place]) 05/29/2019   on Coumadin   Aortic atherosclerosis ([place]) 07/05/2019   Aortic dissection ([place]) 04/04/2019   s/p repair   Bone spur 2008   Right calcaneal foot spur   Breast cancer ([place]) 2004   Ductal carcinoma in situ of the left breast; S/P left partial mastectomy 02/26/2003; S/P re-excision of cranial and lateral margins11/18/2004.radiation   Cerebral thrombosis with cerebral infarction 05/22/2019   Chronic low back pain 06/22/2016   Chronic obstructive lung disease ([place]) 01/16/2017   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 Autumn Messing; S/P re-excision of superior and inferior margins 10/27/2012.    ESRD on hemodialysis ([place]) 05/29/2019   Essential hypertension 09/16/2006   GERD 09/16/2006   Hepatitis C    treated and RNA confirmed not detectable 01/2017   Insomnia 03/14/2015   Malnutrition of moderate degree 05/19/2019   Non compliance with medical treatment 12/04/2017   Normocytic anemia    With thrombocytosis   Osteoarthritis    Right ureteral stone 2002   S/P lumbar spinal fusion 01/18/2014   S/P lumbar decompressive laminectomy, fusion, and plating for lumbar spinal stenosis on 05/27/2009 by Dr. Eustace Moore.  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 Eustace Moore.     Tobacco use disorder 04/19/2009   Uterine fibroid    Wears dentures    top   Past  Surgical History:  Past Surgical History:  Procedure Laterality Date   ANTERIOR LAT LUMBAR FUSION N/A 01/18/2014   Procedure: ANTERIOR LATERAL LUMBAR FUSION LUMBAR TWO-THREE;  Surgeon: Eustace Moore, MD;  Location: [facility];  Service: Neurosurgery;  Laterality: N/A;   ANTERIOR LUMBAR FUSION  01/18/2014   AV FISTULA PLACEMENT Left 04/20/2019   Procedure: ARTERIOVENOUS (AV) FISTULA CREATION;  Surgeon: [name], MD;  Location: [facility];  Service: Vascular;  Laterality: Left;   BACK SURGERY     BREAST LUMPECTOMY Left 01/2003   BREAST LUMPECTOMY Right 2014   BREAST LUMPECTOMY WITH NEEDLE LOCALIZATION AND AXILLARY SENTINEL LYMPH NODE BX Right 10/13/2012   Procedure: BREAST LUMPECTOMY WITH NEEDLE LOCALIZATION;  Surgeon: [name], MD;  Location: [facility];  Service: General;  Laterality: Right;  Right breast wire localized lumpectomy   INSERTION OF DIALYSIS CATHETER Right 04/20/2019   Procedure: INSERTION OF DIALYSIS CATHETER, right internal jugular;  Surgeon: [name], MD;  Location: [facility];  Service: Vascular;  Laterality: Right;   INSERTION OF DIALYSIS CATHETER Right 09/24/2020   Procedure: INSERTION OF TUNNELED DIALYSIS CATHETER;  Surgeon: Rosetta Posner, MD;  Location: [facility];  Service: Vascular;  Laterality: Right;   IR FLUORO GUIDE CV LINE RIGHT  09/22/2020   IR THORACENTESIS ASP PLEURAL SPACE W/IMG GUIDE  05/19/2019   IR US GUIDE VASC ACCESS LEFT  09/22/2020   IR US GUIDE VASC ACCESS RIGHT  09/22/2020   IR VENOCAVAGRAM SVC  09/22/2020   LAMINECTOMY  05/27/2009   Lumbar decompressive  laminectomy, fusion and plating for lumbar spinal stensosis   LIGATION OF ARTERIOVENOUS  FISTULA Left 09/24/2020   Procedure: LIGATION OF LEFT ARM ARTERIOVENOUS  FISTULA;  Surgeon: Rosetta Posner, MD;  Location: [facility];  Service: Vascular;  Laterality: Left;   LUMBAR LAMINECTOMY/DECOMPRESSION MICRODISCECTOMY Left 03/23/2013   Procedure: LUMBAR LAMINECTOMY/DECOMPRESSION  MICRODISCECTOMY 1 LEVEL;  Surgeon: Eustace Moore, MD;  Location: [facility];  Service: Neurosurgery;  Laterality: Left;  LUMBAR LAMINECTOMY/DECOMPRESSION MICRODISCECTOMY 1 LEVEL   MASTECTOMY, PARTIAL Left 02/26/2003   ; S/P re-excision of cranial and lateral margins 04/19/2003.    RE-EXCISION OF BREAST CANCER,SUPERIOR MARGINS Right 10/27/2012   Procedure: RE-EXCISION OF BREAST CANCER,SUPERIOR and inferior MARGINS;  Surgeon: [name], MD;  Location: [facility];  Service: General;  Laterality: Right;   RE-EXCISION OF BREAST LUMPECTOMY Left 04/2003   TEE WITHOUT CARDIOVERSION N/A 04/04/2019   Procedure: Transesophageal Echocardiogram (Tee);  Surgeon: Wonda Olds, MD;  Location: [facility];  Service: Open Heart Surgery;  Laterality: N/A;   THORACIC AORTIC ANEURYSM REPAIR N/A 04/04/2019   Procedure: THORACIC ASCENDING ANEURYSM REPAIR (AAA)  USING 28 MM X 30 CM HEMASHIELD PLATINUM VASCULAR GRAFT;  Surgeon: Wonda Olds, MD;  Location: [facility];  Service: Open Heart Surgery;  Laterality: N/A;   HPI:  HALEE GLYNN is a 71 y.o. female with a past medical history of end-stage renal disease on dialysis on Tuesday Thursday Saturday, hypertension, paroxysmal atrial fibrillation on Eliquis, history of aortic dissection status post repair, recent hospitalization in September for stroke followed by stay in inpatient rehabilitation, chronic systolic CHF with a EF of 30 to 35% who was in her usual state of health till earlier today when she started developing shortness of breath.  Most recent BSE on 9/29/223 recommending Dysphagia 2 solids and thin liquids per MBS results on 9/27.  Pt was seen by ST in inpatient rehab and is currently receiving ST in the outpatient setting targeting dysarthria.    Assessment / Plan / Recommendation  Clinical Impression  Pt was seen for a bedside swallow evaluation and she presents with suspected mild dysphagia.  Pt was lethargic throughout this evaluation, but participated  well given cues.  She was observed to have significant dysarthria which is baseline from previous CVA in September.  Pt consumed trials of thin liquid and puree.  She exhibited timely AP transit with suspected delayed swallow initiation (possibly due to lethargy), but no clinical s/sx of aspiration were observed with PO trials.  Pt politely declined solid trials at this time.  Recommend diet change to Dysphagia 1 (puree) solids and thin liquids with medication administered whole in puree.  Suspect that pt may be able to upgrade solids to Dysphagia 2 (baseline diet) when she is more alert.  SLP Visit Diagnosis: Dysphagia, unspecified (R13.10)    Aspiration Risk  Mild aspiration risk    Diet Recommendation Dysphagia 1 (Puree);Thin liquid   Liquid Administration via: Cup;Straw Medication Administration: Whole meds with puree Compensations: Slow rate;Small sips/bites Postural Changes: Seated upright at 90 degrees    Other  Recommendations      Recommendations for follow up therapy are one component of a multi-disciplinary discharge planning process, led by the attending physician.  Recommendations may be updated based on patient status, additional functional criteria and insurance authorization.  Follow up Recommendations Outpatient SLP      Assistance Recommended at Discharge    Functional Status Assessment Patient has had a recent decline in their functional status  and demonstrates the ability to make significant improvements in function in a reasonable and predictable amount of time.  Frequency and Duration min 2x/week  2 weeks       Prognosis Prognosis for Safe Diet Advancement: Good      Swallow Study   General HPI: SHELLYE ZANDI is a 71 y.o. female with a past medical history of end-stage renal disease on dialysis on Tuesday Thursday Saturday, hypertension, paroxysmal atrial fibrillation on Eliquis, history of aortic dissection status post repair, recent hospitalization in  September for stroke followed by stay in inpatient rehabilitation, chronic systolic CHF with a EF of 30 to 35% who was in her usual state of health till earlier today when she started developing shortness of breath.  Most recent BSE on 9/29/223 recommending Dysphagia 2 solids and thin liquids per MBS results on 9/27.  Pt was seen by ST in inpatient rehab and is currently receiving ST in the outpatient setting targeting dysarthria. Type of Study: Bedside Swallow Evaluation Previous Swallow Assessment: See HPI Diet Prior to this Study: Regular;Thin liquids Temperature Spikes Noted: No Respiratory Status: Room air History of Recent Intubation: No Behavior/Cognition: Cooperative;Pleasant mood;Lethargic/Drowsy Oral Cavity Assessment: Within Functional Limits Oral Care Completed by SLP: No Oral Cavity - Dentition: Dentures, top;Dentures, bottom Patient Positioning: Upright in bed Baseline Vocal Quality: Normal Volitional Swallow: Able to elicit    Oral/Motor/Sensory Function Overall Oral Motor/Sensory Function: Other (comment) (Pt polietly declined)   Ice Chips Ice chips: Not tested   Thin Liquid Thin Liquid: Within functional limits Presentation: Straw    Nectar Thick Nectar Thick Liquid: Not tested   Honey Thick Honey Thick Liquid: Not tested   Puree Puree: Within functional limits   Solid     Solid: Not tested Other Comments:  (Pt polielty declined)     Bretta Bang, M.S., [place] Office: [phone]  [place] 04/30/2022,9:13 AM

## 2022-04-30 NOTE — Progress Notes (Signed)
Pt seen in room this am.  Pt is aphasic, cannot understand her but she understands me. Had HD overnight and K+ down to 3.4 and 3400 cc removed. Lungs are clear today, on RA. K+ and vol better. Will plan HD again today or tonight/ tomorrow depending on staffing.    OP HD: [facility] TTS 3h 64mn  400/1.5   47kg  2/2 bath  Hep none  [facility] TDC  Rob Kaedan Richert, MD 04/30/2022, 9:14 AM  Recent Labs  Lab 04/29/22 2039 04/30/22 0621  HGB 8.6* 8.7*  ALBUMIN 3.8  --   CALCIUM 9.6 9.2  CREATININE 15.48* 6.53*  K 6.3* 3.4*    Inpatient medications:  albuterol  10 mg Nebulization Once   amLODipine  10 mg Oral Daily   apixaban  2.5 mg Oral BID   carvedilol  25 mg Oral BID WC   cloNIDine  0.1 mg Transdermal Q Sat   hydrALAZINE  50 mg Oral BID   isosorbide mononitrate  30 mg Oral Daily   pantoprazole  40 mg Oral BID   rosuvastatin  10 mg Oral Daily   sevelamer carbonate  2.4 g Oral TID WC    anticoagulant sodium citrate     alteplase, anticoagulant sodium citrate, calcium carbonate, heparin, labetalol, lidocaine (PF), lidocaine-prilocaine, metoprolol tartrate, nitroGLYCERIN, pentafluoroprop-tetrafluoroeth

## 2022-04-30 NOTE — Progress Notes (Signed)
Received patient in stretcher to unit.  Alert and oriented.  Informed consent signed and in chart.   Treatment initiated: 0037 Treatment completed: 9741  C/o chest pain, rapid response called in for further evaluation, EKG was done,  nothing abnormal found. Transported back to the room  Alert, without acute distress.  Hand-off given to patient's nurse.   Access used: catheter Access issues: none  Total UF removed: 3400 Medication(s) given: none Post HD VS: 98.2 192/135 98 21 Post HD weight: unable to obtain.   [facility]

## 2022-04-30 NOTE — Plan of Care (Signed)

## 2022-04-30 NOTE — Progress Notes (Unsigned)
Cardiology Office Note    Date:  05/06/2022   ID:  Monique Henderson, DOB 21-Jul-1950, MRN [id number]  PCP:  Monique Mai, MD  Cardiologist:  Previously Monique Henderson amongst multiple other providers Electrophysiologist:  None   Chief Complaint: f/u multiple cardiac issues  History of Present Illness:   Monique Henderson is a 71 y.o. 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   She has vascillated between various groups for cardiology f/u per notes including our office, [facility] and [place]. (The last encounter with our team was with Monique Henderson in 04/2022.) Complex PMH as noted above. She has had known afib for years previously on Coumadin, now on Eliquis. No prior cath on file, but she has had frequent evaluations in the past for chest pain, last with stress test 06/2021 with moderate apical scarring/infarct, no reversible ischemia, EF 41%. This was done by Monique Henderson group to evaluate for decline in EF at that time. She was managed medically. She was admitted 13 times in 2023. This includes recent stroke in 01/2022 and hypertensive emergency 11-05/2022 in the setting of noncompliance with HD which appeared to be a frequent problem. She also has reported h/o cocaine use noted, last positive UDS was in 07/2021. MRA at time of stroke 01/2022 showed "No definite hemodynamically significant stenosis in the neck. Possible beading of the right-greater-than-left distal ICAs, concerning for fibromuscular dysplasia. This could be better evaluated with a CTA of the neck if clinically indicated." CTA not pursued at that time by neuro. Last  echo 01/2022 EF 30-35%, severe LVH, G1DD, severe LAE. EKGs during last 04/2022 admission showed NSR and atrial flutter. She was reported to be in NSR at time of discharge. She has also had frequent mild troponin elevation felt chronic in etiology.  She is seen today in follow-up with her sister whose husband is followed by Dr. Irish Lack Jonnie Henderson). The patient reports she's doing well without any CP or SOB. She does report getting into a pattern of eating ice then not going to HD because she doesn't feel like it, but reports being back on track and compliant with sessions. We discussed that HD is keeping her alive at this point and not going can be very dangerous. She denies any illicit drug use. We discussed her CTA 12/2021 which also showed question of kidney lesion; I recommended she discuss with PCP whom they are seeing soon.  Labwork independently reviewed: Hosp labs 11-05/2022 K 3.9, CR 5.26, Hgb 8.7, plt OK, FOBT neg, troponin 171 but c/w prior, LFTs ok 01/2022 LDL 46 06/2021 TSH abnormal 0.189  Cardiology Studies:   Studies reviewed are outlined and summarized above. Reports included below if pertinent.   2D echo 01/2022  1. Left ventricular ejection fraction, by estimation, is 30 to 35%. Left  ventricular ejection fraction by PLAX is 30 %. The left ventricle has  moderately decreased function. The left ventricle demonstrates global  hypokinesis. There is severe concentric  left ventricular hypertrophy. Left ventricular diastolic parameters are  consistent with Grade I diastolic dysfunction (impaired relaxation).   2. Right ventricular systolic function is normal. The right ventricular  size is normal. There is normal pulmonary  artery systolic pressure.   3. Left atrial size was severely dilated.   4. The mitral valve is normal in structure. Trivial mitral valve  regurgitation. No evidence of mitral stenosis.   5. The aortic valve is tricuspid. Aortic valve regurgitation is not   visualized. No aortic stenosis is present.   6. The inferior vena cava is normal in size with greater than 50%  respiratory variability, suggesting right atrial pressure of 3 mmHg.   Nuc 06/2021 IMPRESSION: 1. No reversible ischemia.  Moderate apical scarring/infarct.   2.  Generalized hypokinesis, severe at the apex.   3. Left ventricular ejection fraction 41%. Moderately abnormal LV systolic volume.   4. Non invasive risk stratification*: Intermediate   *2012 Appropriate Use Criteria for Coronary Revascularization Focused Update: J Am Coll Cardiol. 7425;95(6):387-564. http://content.airportbarriers.com.aspx?articleid=[email or web address]   Electronically Signed   By: Monique Henderson M.D.   On: 06/21/2021 11:19  Nuc 06/2021 FINDINGS: Perfusion: No decreased activity in the left ventricle on stress imaging to suggest reversible ischemia. A moderate fixed apical defect noted compatible with prior infarct/scar.   Wall Motion: Severe apical hypokinesis noted. Mild general hypokinesis and moderate septal hypokinesis noted.   Left Ventricular Ejection Fraction: 41 %   End diastolic volume 332 ml   End systolic volume 75 ml   IMPRESSION: 1. No reversible ischemia.  Moderate apical scarring/infarct.   2.  Generalized hypokinesis, severe at the apex.   3. Left ventricular ejection fraction 41%. Moderately abnormal LV systolic volume.   4. Non invasive risk stratification*: Intermediate   *2012 Appropriate Use Criteria for Coronary Revascularization Focused Update: J Am Coll Cardiol. 9518;84(1):660-630. http://content.airportbarriers.com.aspx?articleid=[email or web address]     Electronically Signed   By: Monique Henderson M.D.   On: 06/21/2021 11:19    Past Medical History:  Diagnosis Date   AF (paroxysmal atrial fibrillation) ([place]) 05/29/2019   on Coumadin   Aortic atherosclerosis ([place]) 07/05/2019   Aortic dissection ([place]) 04/04/2019   s/p repair   Bone spur 2008   Right calcaneal foot  spur   Breast cancer ([place]) 2004   Ductal carcinoma in situ of the left breast; S/P left partial mastectomy 02/26/2003; S/P re-excision of cranial and lateral margins11/18/2004.radiation   Carotid artery disease (H[facility])    Cerebral thrombosis with cerebral infarction 05/22/2019   Chronic HFrEF (heart failure with reduced ejection fraction) (HCC)    Chronic low back pain 06/22/2016   Chronic obstructive lung disease ([place]) 01/16/2017   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 Autumn Messing; S/P re-excision of superior and inferior margins 10/27/2012.    ESRD on hemodialysis ([place]) 05/29/2019   Essential hypertension 09/16/2006   GERD 09/16/2006   Hepatitis C    treated and RNA confirmed not detectable 01/2017   Insomnia 03/14/2015   Malnutrition of moderate degree 05/19/2019   Non compliance with medical treatment 12/04/2017   Normocytic anemia    With thrombocytosis   Osteoarthritis    Right ureteral stone 2002   S/P lumbar spinal fusion 01/18/2014   S/P lumbar decompressive laminectomy, fusion, and plating for lumbar spinal stenosis on 05/27/2009 by Dr. Eustace Moore.  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 Eustace Moore.     Stroke (cerebrum) ([place])    Tobacco use disorder 04/19/2009   Uterine fibroid  Wears dentures    top    Past Surgical History:  Procedure Laterality Date   ANTERIOR LAT LUMBAR FUSION N/A 01/18/2014   Procedure: ANTERIOR LATERAL LUMBAR FUSION LUMBAR TWO-THREE;  Surgeon: Eustace Moore, MD;  Location: [place] [facility];  Service: Neurosurgery;  Laterality: N/A;   ANTERIOR LUMBAR FUSION  01/18/2014   AV FISTULA PLACEMENT Left 04/20/2019   Procedure: ARTERIOVENOUS (AV) FISTULA CREATION;  Surgeon: [name],  MD;  Location: [place];  Service: Vascular;  Laterality: Left;   BACK SURGERY     BREAST LUMPECTOMY Left 01/2003   BREAST LUMPECTOMY Right 2014   BREAST LUMPECTOMY WITH NEEDLE LOCALIZATION AND AXILLARY SENTINEL LYMPH NODE BX Right 10/13/2012   Procedure: BREAST LUMPECTOMY WITH NEEDLE LOCALIZATION;  Surgeon: [name], MD;  Location: [place];  Service: General;  Laterality: Right;  Right breast wire localized lumpectomy   INSERTION OF DIALYSIS CATHETER Right 04/20/2019   Procedure: INSERTION OF DIALYSIS CATHETER, right internal jugular;  Surgeon: [name], MD;  Location: [place];  Service: Vascular;  Laterality: Right;   INSERTION OF DIALYSIS CATHETER Right 09/24/2020   Procedure: INSERTION OF TUNNELED DIALYSIS CATHETER;  Surgeon: Rosetta Posner, MD;  Location: [place];  Service: Vascular;  Laterality: Right;   IR FLUORO GUIDE CV LINE RIGHT  09/22/2020   IR THORACENTESIS ASP PLEURAL SPACE W/IMG GUIDE  05/19/2019   IR US GUIDE VASC ACCESS LEFT  09/22/2020   IR US GUIDE VASC ACCESS RIGHT  09/22/2020   IR VENOCAVAGRAM SVC  09/22/2020   LAMINECTOMY  05/27/2009   Lumbar decompressive laminectomy, fusion and plating for lumbar spinal stensosis   LIGATION OF ARTERIOVENOUS  FISTULA Left 09/24/2020   Procedure: LIGATION OF LEFT ARM ARTERIOVENOUS  FISTULA;  Surgeon: Rosetta Posner, MD;  Location: [place];  Service: Vascular;  Laterality: Left;   LUMBAR LAMINECTOMY/DECOMPRESSION MICRODISCECTOMY Left 03/23/2013   Procedure: LUMBAR LAMINECTOMY/DECOMPRESSION MICRODISCECTOMY 1 LEVEL;  Surgeon: Eustace Moore, MD;  Location: [facility];  Service: Neurosurgery;  Laterality: Left;  LUMBAR LAMINECTOMY/DECOMPRESSION MICRODISCECTOMY 1 LEVEL   MASTECTOMY, PARTIAL Left 02/26/2003   ; S/P re-excision of cranial and lateral margins 04/19/2003.    RE-EXCISION OF BREAST CANCER,SUPERIOR MARGINS Right 10/27/2012   Procedure: RE-EXCISION OF BREAST CANCER,SUPERIOR and inferior MARGINS;  Surgeon: [name], MD;  Location: [place];  Service: General;  Laterality: Right;   RE-EXCISION OF BREAST LUMPECTOMY Left 04/2003   TEE WITHOUT CARDIOVERSION N/A 04/04/2019   Procedure: Transesophageal Echocardiogram (Tee);  Surgeon: Wonda Olds, MD;  Location: [place];  Service: Open Heart Surgery;  Laterality: N/A;   THORACIC AORTIC ANEURYSM REPAIR N/A 04/04/2019   Procedure: THORACIC ASCENDING ANEURYSM REPAIR (AAA)  USING 28 MM X 30 CM HEMASHIELD PLATINUM VASCULAR GRAFT;  Surgeon: Wonda Olds, MD;  Location: [facility];  Service: Open Heart Surgery;  Laterality: N/A;    Current Medications: Current Meds  Medication Sig   albuterol (VENTOLIN HFA) 108 (90 Base) MCG/ACT inhaler Inhale 2 puffs into the lungs every 6 (six) hours as needed for wheezing or shortness of breath.   amLODipine (NORVASC) 10 MG tablet Take 1 tablet (10 mg total) by mouth daily.   apixaban (ELIQUIS) 2.5 MG TABS tablet Take 1 tablet (2.5 mg total) by mouth 2 (two) times daily.   carvedilol (COREG) 25 MG tablet Take 1 tablet (25 mg total) by mouth 2 (two) times daily with a meal.   Cholecalciferol (VITAMIN D-3 PO) Take  1 capsule by mouth daily.   cloNIDine (CATAPRES - DOSED IN MG/24 HR) 0.1 mg/24hr patch Place 1 patch (0.1 mg total) onto the skin every Saturday.   diphenhydrAMINE-zinc acetate (BENADRYL) cream Apply topically 3 (three) times daily as needed for itching.   hydrALAZINE (APRESOLINE) 25 MG tablet Take 2 tablets (50 mg total) by mouth 2 (two) times daily.   isosorbide mononitrate (IMDUR) 30 MG 24 hr tablet Take 1 tablet (30 mg total) by mouth daily.   lidocaine (LIDODERM) 5 % Place 1 patch onto the skin daily. Remove & Discard patch within 12 hours or as directed by MD   naloxone (NARCAN) nasal spray 4 mg/0.1 mL 1 spray as directed.   oxyCODONE-acetaminophen (PERCOCET) 10-325 MG tablet Take 1 tablet by mouth 3 (three) times daily as needed.   pantoprazole (PROTONIX) 40 MG tablet Take 1 tablet (40 mg total) by mouth 2  (two) times daily.   rosuvastatin (CRESTOR) 10 MG tablet Take 1 tablet (10 mg total) by mouth daily.   sevelamer carbonate (RENVELA) 2.4 g PACK Take 2.4 g by mouth 3 (three) times daily with meals.   traZODone (DESYREL) 50 MG tablet Take 0.5 tablets (25 mg total) by mouth at bedtime as needed for sleep.      Allergies:   Shrimp [shellfish allergy], Bactroban [mupirocin], Hydrocodone, Chlorhexidine gluconate, Tylenol [acetaminophen], and Zestril [lisinopril]   Social History   Socioeconomic History   Marital status: Single    Spouse name: Not on file   Number of children: 2   Years of education: Not on file   Highest education level: Not on file  Occupational History   Occupation: disabled  Tobacco Use   Smoking status: Former    Packs/day: 0.25    Years: 44.00    Total pack years: 11.00    Types: Cigarettes    Quit date: 2022    Years since quitting: 1.9   Smokeless tobacco: Never   Tobacco comments:    smoking less  Vaping Use   Vaping Use: Never used  Substance and Sexual Activity   Alcohol use: Not Currently    Alcohol/week: 2.0 standard drinks of alcohol    Types: 2 Cans of beer per week    Comment: "couple beers a weekend", not currently   Drug use: Not Currently    Types: Cocaine    Comment: last in 2005   Sexual activity: Yes    Birth control/protection: Post-menopausal  Other Topics Concern   Not on file  Social History Narrative   Not on file   Social Determinants of Health   Financial Resource Strain: Not on file  Food Insecurity: No Food Insecurity (04/30/2022)   Hunger Vital Sign    Worried About Running Out of Food in the Last Year: Never true    Ran Out of Food in the Last Year: Never true  Transportation Needs: No Transportation Needs (04/30/2022)   PRAPARE - Hydrologist (Medical): No    Lack of Transportation (Non-Medical): No  Physical Activity: Not on file  Stress: Not on file  Social Connections: Not on file      Family History:  The patient's family history includes Bone cancer (age of onset: 23) in her sister; Colon cancer (age of onset: 36) in her mother; Diabetes in her brother, brother, and son; Diabetes (age of onset: 14) in her sister; Hypertension in her brother, brother, mother, sister, and son; Kidney disease in her son; Multiple sclerosis in  her son. There is no history of Breast cancer or Cervical cancer.  ROS:   Please see the history of present illness.  All other systems are reviewed and otherwise negative.    EKG(s)/Additional Labs   EKG:  EKG is not ordered today but reviewed multiple tracings from recent admission.  Recent Labs: 06/20/2021: TSH 0.189 02/23/2022: B Natriuretic Peptide >4,500.0 02/27/2022: Magnesium 2.2 04/29/2022: ALT 15 04/30/2022: Hemoglobin 8.7; Platelets 225 05/01/2022: BUN 29; Creatinine, Ser 5.26; Potassium 3.9; Sodium 136  Recent Lipid Panel    Component Value Date/Time   CHOL 104 02/24/2022 0411   CHOL 167 05/13/2020 1108   TRIG 43 02/24/2022 0411   HDL 49 02/24/2022 0411   HDL 39 (L) 05/13/2020 1108   CHOLHDL 2.1 02/24/2022 0411   VLDL 9 02/24/2022 0411   LDLCALC 46 02/24/2022 0411   LDLCALC 100 (H) 05/13/2020 1108    PHYSICAL EXAM:    VS:  BP 130/72 (BP Location: Left Arm, Patient Position: Sitting, Cuff Size: Normal)   Pulse 74   Ht '5\' 3"'$  (1.6 m)   Wt 105 lb (47.6 kg)   SpO2 98%   BMI 18.60 kg/m   BMI: Body mass index is 18.6 kg/m.  GEN: Well nourished, well developed female in no acute distress HEENT: normocephalic, atraumatic Neck: no JVD, carotid bruits, or masses Cardiac: RRR; no murmurs, rubs, or gallops, no edema  Respiratory:  clear to auscultation bilaterally, normal work of breathing GI: soft, nontender, nondistended, + BS MS: no deformity or atrophy Skin: warm and dry, no rash Neuro:  Alert and Oriented x 3, Strength and sensation are intact, follows commands Psych: euthymic mood, full affect  Wt Readings from  Last 3 Encounters:  05/06/22 105 lb (47.6 kg)  04/30/22 106 lb 0.7 oz (48.1 kg)  04/20/22 108 lb (49 kg)     ASSESSMENT & PLAN:   1. Elevated troponin - this appears chronically elevated when she presents for HTN urgency in the setting of noncompliance with HD. She had no ischemia on stress test in 06/2021. In light of nonischemic nuc and lack of chest pain when adherent to regimen, recommend medical therapy. She is not on ASA any longer with ongoing Eliquis rx and anemia.  2. Chronic HFrEF in setting of ESRD on HD, essential HTN - etiology of cardiomyopathy not fully determined but had nonischemic nuc in 06/2021 therefore may be related to poorly controlled HTN and noncompliance with HD. Given her frequent readmissions would be hesitant to disrupt her current regimen since she is actually doing quite well - we will continue, with repeat echo in February before she establishes care with Dr. Irish Lack to f/u LV function (if she is not readmitted again in the meantime). Currently does not appear to be candidate for ICD with noncompliance issues, can revisit in follow-up after echo contingent on clinical course.  3. Carotid artery disease - noted on prior carotid duplex through MRA did not show any significant intracranial stenosis. Consider repeat duplex in 1 year, can be determined at next OV.  4. H/o type A aortic dissection - CTA 12/2021 stable. Will establish care with Dr. Irish Lack later this year. BP control and adherence will be of utmost importance. Not on ASA with concomitant Eliquis and anemia.  5. PAF/flutter - clinically back in NSR today. She is maintained on carvedilol, Eliquis (dose appropriate with ESRD and weight <60kg). Denies recurrent cocaine use. Continue this strategy for now. Will also get f/u TSH today.  6. Anemia -  chronic waxing and waning issue. Last Hgb 8.7, previously as high as 13 though? Will recheck today otherwise recommend f/u PCP.     Disposition: F/u with Dr.  Irish Lack in 07/2022 as already arranged (sister commented that he is the best since her husband sees him!).   Medication Adjustments/Labs and Tests Ordered: Current medicines are reviewed at length with the patient today.  Concerns regarding medicines are outlined above. Medication changes, Labs and Tests ordered today are summarized above and listed in the Patient Instructions accessible in Encounters.   Signed, Charlie Pitter, PA-C  05/06/2022 11:07 AM    [place] Phone: [phone]; Fax: [phone]

## 2022-04-30 NOTE — Significant Event (Signed)
Rapid Response Event Note   Reason for Call :  CP  Pt is 2 hours in to her HD tx.   She was on a ntg gtt prior to coming to HD for htn. This was turned off just prior to coming to HD.   Initial Focused Assessment:  Pt lying in bed with eyes closed. She wakes up to verbal commands and c/o 9/10 mid sternal chest pain. She says she has had this pain before since she has been here. She is also c/o leg pain. When left alone pt falls asleep.  Lungs diminished t/o. Skin warm and dry.   HR-80, BP-141/90, RR-21, SpO2-99% on RA.   Interventions:  EKG-Sinus rhythm with Premature atrial complexes with Abberant conduction Left atrial enlargement Right bundle branch block  Plan of Care:  Once EKG done, pt said her CP is no longer present. Continue to monitor pt closely. Call RRT if further assistance needed.   Event Summary:   MD Notified: RN to notify MD of happenings.  Call [place] [facility]:[phone]  Dillard Essex, RN

## 2022-05-01 DIAGNOSIS — N186 End stage renal disease: Secondary | ICD-10-CM | POA: Diagnosis not present

## 2022-05-01 DIAGNOSIS — I480 Paroxysmal atrial fibrillation: Secondary | ICD-10-CM | POA: Diagnosis not present

## 2022-05-01 DIAGNOSIS — J9601 Acute respiratory failure with hypoxia: Secondary | ICD-10-CM | POA: Diagnosis not present

## 2022-05-01 DIAGNOSIS — I161 Hypertensive emergency: Secondary | ICD-10-CM | POA: Diagnosis not present

## 2022-05-01 LAB — RENAL FUNCTION PANEL
Albumin: 3.3 g/dL — ABNORMAL LOW (ref 3.5–5.0)
Anion gap: 16 — ABNORMAL HIGH (ref 5–15)
BUN: 29 mg/dL — ABNORMAL HIGH (ref 8–23)
CO2: 29 mmol/L (ref 22–32)
Calcium: 9.7 mg/dL (ref 8.9–10.3)
Chloride: 91 mmol/L — ABNORMAL LOW (ref 98–111)
Creatinine, Ser: 5.26 mg/dL — ABNORMAL HIGH (ref 0.44–1.00)
GFR, Estimated: 8 mL/min — ABNORMAL LOW (ref >60)
Glucose, Bld: 94 mg/dL (ref 70–99)
Phosphorus: 5.9 mg/dL — ABNORMAL HIGH (ref 2.5–4.6)
Potassium: 3.9 mmol/L (ref 3.5–5.1)
Sodium: 136 mmol/L (ref 135–145)

## 2022-05-01 LAB — HEPATITIS B SURFACE ANTIBODY, QUANTITATIVE: Hep B S AB Quant (Post): 114.1 m[IU]/mL (ref >9.9)

## 2022-05-01 MED ORDER — CARVEDILOL 25 MG PO TABS: 25.0000 mg | ORAL_TABLET | Freq: Two times a day (BID) | ORAL | 1 refills | Status: DC

## 2022-05-01 MED ORDER — PANTOPRAZOLE SODIUM 40 MG PO TBEC: 40.0000 mg | DELAYED_RELEASE_TABLET | Freq: Two times a day (BID) | ORAL | 1 refills | Status: DC

## 2022-05-01 MED ORDER — HYDRALAZINE HCL 25 MG PO TABS: 50.0000 mg | ORAL_TABLET | Freq: Two times a day (BID) | ORAL | 1 refills | Status: DC

## 2022-05-01 NOTE — Progress Notes (Signed)
Pt seen in room this am. Aphasia is much better today. SP HD here w/ 3.4 L and 1.7 L UF, total UF 5.1L while here. Going home today. 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 Very close to dry wt. Will not change edw on dc. Going home today. Pt remains in OBV status.      OP HD: [facility] TTS 3h 22mn  400/1.5   47kg  2/2 bath  Hep none  [facility] TDC   Rob SJonnie Finner MD 04/30/2022, 9:14 AM  RKelly Splinter MD 05/01/2022, 1:09 PM  Recent Labs  Lab 04/29/22 2039 04/30/22 0621 05/01/22 0350  HGB 8.6* 8.7*  --   ALBUMIN 3.8  --  3.3*  CALCIUM 9.6 9.2 9.7  PHOS  --   --  5.9*  CREATININE 15.48* 6.53* 5.26*  K 6.3* 3.4* 3.9     Inpatient medications:  albuterol  10 mg Nebulization Once   amLODipine  10 mg Oral Daily   apixaban  2.5 mg Oral BID   carvedilol  25 mg Oral BID WC   cloNIDine  0.1 mg Transdermal Q Sat   hydrALAZINE  50 mg Oral BID   isosorbide mononitrate  30 mg Oral Daily   pantoprazole  40 mg Oral BID   rosuvastatin  10 mg Oral Daily   sevelamer carbonate  2.4 g Oral TID WC    anticoagulant sodium citrate     alteplase, anticoagulant sodium citrate, calcium carbonate, heparin, labetalol, lidocaine (PF), lidocaine-prilocaine, metoprolol tartrate, nitroGLYCERIN, pentafluoroprop-tetrafluoroeth

## 2022-05-01 NOTE — Therapy (Signed)
OUTPATIENT OCCUPATIONAL THERAPY TREATMENT NOTE  Patient Name: Monique Henderson MRN: [id number] DOB:11-07-1950, 71 y.o., female Today's Date: 05/04/2022  PCP: N/A REFERRING PROVIDER: Bary Leriche, PA-C    OT End of Session - 05/04/22 1446     Visit Number 6    Number of Visits 12    Date for OT Re-Evaluation 05/15/22    Authorization Type [employer] Medicare    Progress Note Due on Visit 10    OT Start Time 1433    OT Stop Time 1513    OT Time Calculation (min) 40 min    Activity Tolerance Patient tolerated treatment well;No increased pain;Patient limited by pain;Patient limited by fatigue    Behavior During Therapy [facility] for tasks assessed/performed                Past Medical History:  Diagnosis Date   AF (paroxysmal atrial fibrillation) ([place]) 05/29/2019   on Coumadin   Aortic atherosclerosis ([place]) 07/05/2019   Aortic dissection ([place]) 04/04/2019   s/p repair   Bone spur 2008   Right calcaneal foot spur   Breast cancer ([place]) 2004   Ductal carcinoma in situ of the left breast; S/P left partial mastectomy 02/26/2003; S/P re-excision of cranial and lateral margins11/18/2004.radiation   Cerebral thrombosis with cerebral infarction 05/22/2019   Chronic low back pain 06/22/2016   Chronic obstructive lung disease ([place]) 01/16/2017   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 Autumn Messing; S/P re-excision of superior and inferior margins 10/27/2012.    ESRD on hemodialysis ([place]) 05/29/2019   Essential hypertension 09/16/2006   GERD 09/16/2006   Hepatitis C    treated and RNA confirmed not detectable 01/2017   Insomnia 03/14/2015   Malnutrition of moderate degree 05/19/2019   Non compliance with medical treatment 12/04/2017   Normocytic anemia    With thrombocytosis   Osteoarthritis    Right ureteral stone 2002   S/P lumbar spinal fusion 01/18/2014   S/P lumbar decompressive laminectomy, fusion, and plating for lumbar  spinal stenosis on 05/27/2009 by Dr. Eustace .  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 Eustace .     Tobacco use disorder 04/19/2009   Uterine fibroid    Wears dentures    top   Past Surgical History:  Procedure Laterality Date   ANTERIOR LAT LUMBAR FUSION N/A 01/18/2014   Procedure: ANTERIOR LATERAL LUMBAR FUSION LUMBAR TWO-THREE;  Surgeon: Eustace , MD;  Location: [place] [facility];  Service: Neurosurgery;  Laterality: N/A;   ANTERIOR LUMBAR FUSION  01/18/2014   AV FISTULA PLACEMENT Left 04/20/2019   Procedure: ARTERIOVENOUS (AV) FISTULA CREATION;  Surgeon: [name], MD;  Location: [place];  Service: Vascular;  Laterality: Left;   BACK SURGERY     BREAST LUMPECTOMY Left 01/2003   BREAST LUMPECTOMY Right 2014   BREAST LUMPECTOMY WITH NEEDLE LOCALIZATION AND AXILLARY SENTINEL LYMPH NODE BX Right 10/13/2012   Procedure: BREAST LUMPECTOMY WITH NEEDLE LOCALIZATION;  Surgeon: [name], MD;  Location: [place];  Service: General;  Laterality: Right;  Right breast wire localized lumpectomy   INSERTION OF DIALYSIS CATHETER Right 04/20/2019   Procedure: INSERTION OF DIALYSIS CATHETER, right internal jugular;  Surgeon: [name], MD;  Location: [place];  Service: Vascular;  Laterality: Right;   INSERTION OF DIALYSIS CATHETER Right 09/24/2020   Procedure: INSERTION OF TUNNELED DIALYSIS CATHETER;  Surgeon: Rosetta Posner, MD;  Location: [place];  Service: Vascular;  Laterality: Right;   IR FLUORO GUIDE CV LINE RIGHT  09/22/2020   IR THORACENTESIS ASP PLEURAL SPACE W/IMG GUIDE  05/19/2019   IR US GUIDE VASC ACCESS LEFT  09/22/2020   IR US GUIDE VASC ACCESS RIGHT  09/22/2020   IR VENOCAVAGRAM SVC  09/22/2020   LAMINECTOMY  05/27/2009   Lumbar  decompressive laminectomy, fusion and plating for lumbar spinal stensosis   LIGATION OF ARTERIOVENOUS  FISTULA Left 09/24/2020   Procedure: LIGATION OF LEFT ARM ARTERIOVENOUS  FISTULA;  Surgeon: Rosetta Posner, MD;  Location: [place];  Service: Vascular;  Laterality: Left;   LUMBAR LAMINECTOMY/DECOMPRESSION MICRODISCECTOMY Left 03/23/2013   Procedure: LUMBAR LAMINECTOMY/DECOMPRESSION MICRODISCECTOMY 1 LEVEL;  Surgeon: Eustace , MD;  Location: [place] [facility];  Service: Neurosurgery;  Laterality: Left;  LUMBAR LAMINECTOMY/DECOMPRESSION MICRODISCECTOMY 1 LEVEL   MASTECTOMY, PARTIAL Left 02/26/2003   ; S/P re-excision of cranial and lateral margins 04/19/2003.    RE-EXCISION OF BREAST CANCER,SUPERIOR MARGINS Right 10/27/2012   Procedure: RE-EXCISION OF BREAST CANCER,SUPERIOR and inferior MARGINS;  Surgeon: [name], MD;  Location: [place];  Service: General;  Laterality: Right;   RE-EXCISION OF BREAST LUMPECTOMY Left 04/2003   TEE WITHOUT CARDIOVERSION N/A 04/04/2019   Procedure: Transesophageal Echocardiogram ([place]);  Surgeon: Wonda Olds, MD;  Location: [place];  Service: Open Heart Surgery;  Laterality: N/A;   THORACIC AORTIC ANEURYSM REPAIR N/A 04/04/2019   Procedure: THORACIC ASCENDING ANEURYSM REPAIR (AAA)  USING 28 MM X 30 CM HEMASHIELD PLATINUM VASCULAR GRAFT;  Surgeon: Wonda Olds, MD;  Location[facility];  Service: Open Heart Surgery;  Laterality: N/A;   Patient Active Problem List   Diagnosis Date Noted   Right hemiparesis ([place]) 04/06/2022   Acute cardioembolic stroke ([place]) 06/09/3233   Stroke (cerebrum) ([place]) 02/23/2022   Stroke ([place]) 02/23/2022   Hypervolemia associated with renal insufficiency 01/29/2022   Dyslipidemia 01/29/2022   DNR (do not resuscitate) 01/29/2022   Polysubstance abuse ([place]) 01/06/2022   Acute on chronic combined systolic and diastolic CHF (congestive heart failure) ([place]) 01/06/2022   Abdominal pain 57/32/2025   Acute metabolic encephalopathy 42/70/6237    Malignant HTN with heart disease, w/o CHF, 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 12/24/2021   History of hepatitis C 12/08/2021   Hypertensive urgency 10/29/2021   Fluid overload 10/29/2021   Acute on chronic HFrEF (heart failure with reduced ejection fraction) ([place]) 10/29/2021   Chronic anticoagulation 10/29/2021   Prolonged QT interval 10/29/2021   Leukopenia 10/29/2021   SOB (shortness of breath) 09/15/2021   Hypertensive emergency 08/23/2021   Altered mental status    ESRD (end stage renal disease) on dialysis with hyperkalemia and metabolic acidosis with elevated AG  08/12/2021   Malignant hypertension 06/19/2021   Anemia 06/05/2021   Chronic diastolic congestive heart failure ([place]) 04/30/2021   Pure hypercholesterolemia 02/14/2021   Acute pulmonary edema ([place]) 12/23/2020   Acute respiratory failure with hypoxia ([place]) 09/21/2020   Acute renal failure superimposed on chronic kidney disease ([place]) 09/21/2020   Community acquired pneumonia 09/21/2020   Uremia 62/83/1517   Metabolic acidosis with increased anion gap and accumulation of organic acids 09/21/2020   Hypertensive crisis 09/21/2020   Troponin level elevated 08/28/2019   Positive D dimer 08/28/2019   Hyperkalemia 08/28/2019   SVT (supraventricular tachycardia) 08/28/2019   SIRS (  systemic inflammatory response syndrome) ([facility]) 08/27/2019   Paroxysmal SVT (supraventricular tachycardia) 08/27/2019   Aortic atherosclerosis ([place]) 07/05/2019   Chest pain 07/05/2019   History of CVA (cerebrovascular accident) 05/29/2019   AF (paroxysmal atrial fibrillation) ([place]) 05/29/2019   ESRD on dialysis ([place]) 05/29/2019   Cerebral thrombosis with cerebral infarction 05/22/2019   Malnutrition of moderate degree 05/19/2019   Pleural effusion 05/18/2019   Atrial fibrillation with RVR ([place]) 05/18/2019   S/P aortic aneurysm repair 04/07/2019   Aortic dissection ([place]) 04/04/2019   Dissection of aorta ([place]) 04/03/2019   Non compliance with medical  treatment 12/04/2017   Chronic obstructive lung disease ([place]) 01/16/2017   Chronic low back pain 06/22/2016   Insomnia 03/14/2015   S/P lumbar spinal fusion 01/18/2014   Tobacco use disorder 04/19/2009   GERD 09/16/2006    ONSET DATE:  DOI: 02/22/22 (CVA)  REFERRING DIAG: I63.9 (ICD-10-CM) - Cerebral infarction, unspecified  THERAPY DIAG:  Muscle weakness (generalized)  Localized edema  Other lack of coordination  Stiffness of right hand, not elsewhere classified  Stiffness of right wrist, not elsewhere classified  Rationale for Evaluation and Treatment: Rehabilitation  PERTINENT HISTORY: She suffered a stroke on 02/22/22 to her Lt posterior-frontal parietal and anterior-parietal areas causing dysphagia, right hemiplegia, speech issues, and cognitive problems. She spent ~2 weeks in the hospital, in inpatient rehab. At discharge from inpatient rehab, she was noted to have: no right handed grip, 75% intelligible speech, SBA with no device for transfers, dysphagia, HTN, HLD, tachycardia, A-fib, CKD, and chest pain (other than cardiac).   PRECAUTIONS: Fall and Other: Rt hemiplegia, speech, swallow and cognitive issues  WEIGHT BEARING RESTRICTIONS: No   SUBJECTIVE:   SUBJECTIVE STATEMENT: She returns after missing last visit due to hospitalization.  Apparently, she missed her dialysis apt and was having a bad headache, being in pain, went to ED and was admitted for 1-2 days.  Today she is more lethargic than normal, even nodding off during therapy treatments. It makes it difficult to accomplish tasks. OT attempts to take B/P but she has a thick sweater with tight sleeve and a reading can't be taken. She states no headache, but Rt thumb hurting her. She still has compressive wraps on. She admits to falling 1x at home since last seen but denies significant injury. She states she fell because she was so sleepy/tired.    PAIN:  Are you having pain? No  Rating: 0/10 at rest now     PATIENT GOALS: To get right dom arm moving better.    OBJECTIVE: (All objective assessments below are from initial evaluation on: 04/01/22 unless otherwise specified.)    HAND DOMINANCE: Right   ADLs: Overall ADLs: States decreased ability to grab, hold household objects, pain and inability to open containers, perform FMS tasks (manipulate fasteners on clothing), mild to moderate bathing problems as well.    FUNCTIONAL OUTCOME MEASURES: Eval: Quck DASH 38.6% impairment today  (Higher % Score  =  More Impairment)     UPPER EXTREMITY ROM     Shoulder to Wrist AROM Right eval Rt  04/08/22  Shoulder flexion 108* in scaption 118* in scaption  Shoulder abduction 128* 107*  Shoulder extension 68   Shoulder internal rotation    Shoulder external rotation    Elbow flexion 130 138*  Elbow extension (-10) 0*  Forearm supination 65 51*  Forearm pronation  65 60*  Wrist flexion 15   Wrist extension 4   Wrist ulnar deviation 19  Wrist radial deviation 5   (Blank rows = not tested)  Eval: She has some (~10-15*) total motion in composite finger flexion and extension today with prolonged squeezes. Thumb motion is nil.   Hand AROM Right eval  Full Fist Ability (or Gap to Distal Palmar Crease) 5.5cm gap from MF tip to [facility]  Thumb Opposition to Small Finger (or Gap) unable  Thumb Opposition to Base of Small Finger (or Gap)  unable  (Blank rows = not tested)   UPPER EXTREMITY MMT:    Eval:  Right Arm: Grossly 3+/5 at shoulder, 3+/5 at elbow, 3-/5 at forearm, 2-/5 at wrist and 1+/5 in fingers  MMT Right TBD  Shoulder flexion   Shoulder abduction   Shoulder adduction   Shoulder extension   Shoulder internal rotation   Shoulder external rotation   Middle trapezius   Lower trapezius   Elbow flexion   Elbow extension   Forearm supination   Forearm pronation   Wrist flexion   Wrist extension   Wrist ulnar deviation   Wrist radial deviation   (Blank rows = not  tested)  HAND FUNCTION: Eval: Observed weakness in affected hand.  Grip strength Right: 0 lbs, Left: 26 lbs   COORDINATION: 04/27/22: Box and Blocks Test: Right able to grasp and lift block but it drops from hand before she can move it over.   Eval: Observed coordination impairments with affected rt hand. Box and Blocks Test: Right unable to grasp a block today (TBD is [facility])  SENSATION: Eval:  Light touch intact today, equal b/l per pt  EDEMA:   04/15/22: 46.7cm today at start (yesterday had dialysis and it obviously made it worse). (45cm after facilitated e-stim.)   04/13/22: 45cm figure of 8 around Rt hand to start    COGNITION: Eval: Overall cognitive status: overtly seemed [facility] for evaluation today, though perhaps a bit impulsive.  She is working with SLP for this and speech   OBSERVATIONS:   Eval: She has fairly good motion at shoulder and elbow, speech can be difficult to understand    TODAY'S TREATMENT:  05/04/22: OT removes compression bandages that were placed last week. She was told to remove these if they became bothersome, but she did not.  They worked well to remove all swelling in her palm and wrist, but there were pockets of swelling around MCP Js that could not be wrapped. OT does manual edema reduction techniques to help improve this, and she flinches in pain when thumb is touched. Thumb tender, but she denies hurting it since last seen (though does state falling 1x in her home, when she was very sleepy). OT does manual PROM and light stretches to fingers and thumb as tolerated (which is not much today). OT then pulls up HEP and has her perform back to OT 5x each of the below exercises.  Her wrist seems limp today, and hand motion is almost non-existent. She is able to do a cone stacking activity fairly well with Rt hand today, leading OT to think that she just hasn't been as motivated to "try" AROM and likely hasn't been doing HEP consistently.    04/27/22: Due to  rampant swelling in right hand still, OT does edema control techniques and wraps her hand with gauze roll (as this is hypoallergenic and she states having some itching after elastic tubigrip was applied previously) followed by compressive Coban wrap to try to help with swelling (manual therapy).  OT is worried that as long as  it stays swollen her motion is even more limited which will cause very negative outcomes post stroke.  Next OT helps strap her hand to the UBE and she does 3 minutes assisted push and pull at 0 resistance with hand overhand assist from OT as well.  Next she does functional activity to catch and toss a large therapy ball back-and-forth with OT.  She is able to do this with gross motor controls, though her hand does not contribute much other than a stabilizing force.  This still gets her involved with bilateral activity.  Next she attempts to do the box and blocks Test and is able to grasp a 1 inch block today and lifted from the table, however it slips out of her hand before she can move it anywhere.  OT then uses a large cone stack and with teaching and coaching, she is able to grasp and move 12 cones from 1 stack to another.  This is encouraging for her.  She is encouraged to do as many bilateral activities as she can at home like folding laundry, if she can pinch it.  She also reviews squeezing her sponge and a palmar pinch fashion, and does so in clinic 5 times with 3-second holds.   PATIENT EDUCATION: Education details: See tx section above for details  Person educated: Patient Education method: Verbal Instruction, Teach back, Handouts  Education comprehension: States and demonstrates understanding, Additional Education required    HOME EXERCISE PROGRAM: Access Code: [email or web address] URL: [email or web address] Date: 04/08/2022 Prepared by: Benito Mccreedy   GOALS: Goals reviewed with patient? Yes  SHORT TERM GOALS: Target date: 04/17/22  Pt will demo/state  understanding of initial home exercise program (HEP) to improve function, pain, and independence.   Baseline: Needs a plan for rehabilitation  Goal status: MET for basic HEP now.   2.  Pt will obtain protective, custom or mobilizing orthotic for safety and to improve function.  Baseline: Needs orthotic support  Goal status: TBD (This may be necessary to protect hand, if motion does not improve.)    LONG TERM GOALS: Target date: 05/15/22  Pt will improve functional ability by decreased impairment per Quick DASH assessment to 15% or better, for better quality of life. Baseline: 38.6% Goal status: INITIAL  2.  Pt will improve A/ROM in Rt wrist flex/ext to at least 40* each, to have prerequisite/functional motion for tasks like reach and grasp.  Baseline: 15* / 4* respectively  Goal status: INITIAL  3.  Pt will improve strength in Rt elbow to at least 4+/5 MMT to have increased functional ability to carry out selfcare and higher-level homecare tasks with no difficulty Baseline: 3+/5 MMT Goal status: INITIAL  4.  Pt will improve coordination skills in Rt hand, as seen by increasing score on box and blocks testing to at least 20 blocks to have increased functional ability to carry out fine motor tasks (fasteners, etc.) and more complex, coordinated IADLs (meal prep, sports, etc.).  Baseline: unable to grab a block Goal status: INITIAL  5.   Pt will improve grip strength in effected hand to at least 15# lbs for functional use at home and in IADLs. Baseline: 0 lbs Goal status: INITIAL   ASSESSMENT:  CLINICAL IMPRESSION: 05/04/22: Today she was more lethargic than normal, even nodding off during therapy treatments. Her lethargy and poor motivation (also complications with non-compliance with dialysis) is making therapy prognosis worse, and in some respects she presents wore now than at the start of care. If  this continues, and/or she misses more dialysis, has more hospitalizations, she may  not be appropriate for outpatient treatment. She was cautioned about all this and asked to get a good night's rest, go to dialysis appointments, call her doctor is symptoms worsen or go to ED again if concern (signs and symptoms reviewed) of new stroke arises.    PLAN:  OT FREQUENCY: 2x/week  OT DURATION: 6 weeks (through 05/15/22 as needed)   PLANNED INTERVENTIONS: self care/ADL training, therapeutic exercise, therapeutic activity, neuromuscular re-education, manual therapy, passive range of motion, splinting, electrical stimulation, ultrasound, compression bandaging, moist heat, cryotherapy, contrast bath, patient/family education, coping strategies training, and DME and/or AE instructions  RECOMMENDED OTHER SERVICES: she is already receiving SLP and PT, perhaps would benefit from nutrition consult, if not already going, for CKD and stroke diet recommendations   CONSULTED AND AGREED WITH PLAN OF CARE: Patient and family member/caregiver  PLAN FOR NEXT SESSION:  Reassessment due next week. This will be vital in decision making process.   Benito Mccreedy, OTR/L, CHT 05/04/2022, 3:29 PM

## 2022-05-01 NOTE — Progress Notes (Signed)
D/C order noted. Contacted [facility] SW to advise clinic of pt's d/c today and that pt should resume care tomorrow at clinic.  Melven Sartorius Renal Navigator [phone]

## 2022-05-01 NOTE — Evaluation (Signed)
Occupational Therapy Evaluation Patient Details Name: Monique Henderson MRN: [id number] DOB: 1950/06/09 Today's Date: 05/01/2022   History of Present Illness Pt is a 72 y/o F admitted on 04/29/22 after presenting with acute hypoxic respiratory failure due to volume overload due to noncompliance with HD. 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  Current level of function is below patient's typical baseline prior to her CVA in September 2023.  During this evaluation, patient was limited by generalized weakness with RT HP, impaired activity tolerance, and decreased judgment, all of which has the potential to impact patient's safety and independence during functional mobility, as well as performance for ADLs.  Patient lives in an apartment with stair lift, with her boyfriend who is able to provide 24/7 supervision and assistance with help from other family members.  Patient demonstrates good rehab potential, and should benefit from continued skilled occupational therapy services while in acute care to maximize safety, independence and quality of life at home.  Continued occupational therapy services in a outpatient setting is recommended.  ?       Recommendations for follow up therapy are one component of a multi-disciplinary discharge planning process, led by the attending physician.  Recommendations may be updated based on patient status, additional functional criteria and insurance authorization.   Follow Up Recommendations  Outpatient OT     Assistance Recommended at Discharge Frequent or constant Supervision/Assistance  Patient can return home with the following A little help with  bathing/dressing/bathroom;Assist for transportation;Assistance with cooking/housework;Direct supervision/assist for financial management;Direct supervision/assist for medications management    Functional Status Assessment  Patient has had a recent decline in their functional status and demonstrates the ability to make significant improvements in function in a reasonable and predictable amount of time.  Equipment Recommendations  Tub/shower bench    Recommendations for Other Services       Precautions / Restrictions Precautions Precautions: Fall Restrictions Weight Bearing Restrictions: No      Mobility Bed Mobility               General bed mobility comments: Pt recevied in [place].    Transfers                          Balance Overall balance assessment: Needs assistance Sitting-balance support: No upper extremity supported Sitting balance-Leahy Scale: Good     Standing balance support: During functional activity, No upper extremity supported Standing balance-Leahy Scale: Fair Standing balance comment: Min Guard needed when standing from toilet due to posterior drift but pt able to correct by use of posterior LEs pushing on toilet.                           ADL either performed or assessed with clinical judgement   ADL Overall ADL's : Needs assistance/impaired Eating/Feeding: Set up   Grooming: Standing;Supervision/safety;Min guard   Upper Body Bathing: Moderate assistance;Sitting Upper Body Bathing Details (indicate cue type and reason): To wash underwearms due to RT HP Lower Body Bathing: Cueing for safety;Cueing for sequencing;Min guard;Sitting/lateral leans;Sit to/from stand   Upper Body Dressing : Moderate assistance;Cueing for sequencing;Cueing for compensatory techniques  Lower Body Dressing: Sitting/lateral leans;Minimal assistance;Sit to/from stand Lower Body Dressing Details (indicate cue type and reason): Pt able to doff and  don socks while seated on commode. Min As to doff and don leggings over feet due to tightness. Pt then able to stand and don over hips with SBA to Min guard. Toilet Transfer: Min guard;Comfort height toilet;Ambulation   Toileting- Clothing Manipulation and Hygiene: Cueing for safety;Cueing for sequencing;Cueing for compensatory techniques;Min guard     Tub/Shower Transfer Details (indicate cue type and reason): Handout provided to sister for use of BSC as shower chair as sister reports that [facility] denied pt shower chair. Functional mobility during ADLs: Cueing for safety;Min guard;Supervision/safety       Vision   Vision Assessment?: No apparent visual deficits     Perception     Praxis      Pertinent Vitals/Pain Pain Assessment Pain Assessment: No/denies pain     Hand Dominance Right   Extremity/Trunk Assessment Upper Extremity Assessment Upper Extremity Assessment: RUE deficits/detail RUE Deficits / Details: Pt with coban around R wrist & digits of R hand, pt reports outpatient therapist did this to attempt to assist with edema management.. Pt with RT UE HP since CVA RUE Coordination: decreased fine motor;decreased gross motor   Lower Extremity Assessment Lower Extremity Assessment: Generalized weakness   Cervical / Trunk Assessment Cervical / Trunk Assessment: Normal   Communication Communication Communication: Expressive difficulties   Cognition Arousal/Alertness: Awake/alert Behavior During Therapy: WFL for tasks assessed/performed Overall Cognitive Status: Within Functional Limits for tasks assessed                                 General Comments: Needs cues for judgment. Example: Pt denied need to change underwear despite odor and visual stool. Required Visual cues to agree to fresh underwear.     General Comments  SPO2 >90% on room air    Exercises     Shoulder Instructions      Home Living Family/patient expects to be discharged to::  Private residence Living Arrangements: Spouse/significant other (Boyfriend) Available Help at Discharge: Family;Available 24 hours/day (Sisters provide additional assistance with personal ADLs) Type of Home: Apartment Home Access: Stairs to enter [employer] of Steps: pt reports flight of stairs but notes theres a Marine scientist Stairs-Rails: Can reach both Home Layout: One level     Bathroom Shower/Tub: Sponge bathes at baseline;Tub/shower unit;Curtain   Bathroom Toilet: Standard     Home Equipment: Conservation officer, nature (2 wheels);Cane - single point;BSC/3in1   Additional Comments: Sister to room but is not familiar with pt's home setup. Stated that another sister does come and assist with bathes and dressing. S.O. available 24/7 and pt reports he is in good health.      Prior Functioning/Environment Prior Level of Function : Independent/Modified Independent (Prior to September CV A)             Mobility Comments: Pt reports she has been ambulating without AD, denies falls, attending OPPT and [place]. ADLs Comments: independent ADLs, IADLs, driving (NP from nephrology reports 11th admission this year, medical non compliance)        OT Problem List: Decreased range of motion;Decreased strength;Decreased coordination;Decreased cognition;Impaired balance (sitting and/or standing);Impaired UE functional use;Decreased safety awareness      OT Treatment/Interventions: Self-care/ADL training;Therapeutic exercise;Therapeutic activities;Neuromuscular education;Cognitive remediation/compensation;DME and/or AE instruction;Patient/family education;Balance training;Manual therapy    OT Goals(Current goals can be found in the  care plan section) Acute Rehab OT Goals Patient Stated Goal: To go home today OT Goal Formulation: With patient Time For Goal Achievement: 05/15/22 Potential to Achieve Goals: Good ADL Goals Pt Will Perform Upper Body Bathing: with  supervision;sitting;with adaptive equipment Pt Will Perform Upper Body Dressing: with adaptive equipment;with set-up;with supervision;sitting Pt Will Perform Lower Body Dressing: with supervision;sitting/lateral leans;sit to/from stand;with adaptive equipment Pt Will Transfer to Toilet: with modified independence;ambulating Pt Will Perform Toileting - Clothing Manipulation and hygiene: with modified independence;with adaptive equipment;sitting/lateral leans;sit to/from stand Pt Will Perform Tub/Shower Transfer: with min guard assist;3 in 1  OT Frequency: Min 2X/week    Co-evaluation              AM-PAC OT "6 Clicks" Daily Activity     Outcome Measure Help from another person eating meals?: A Little Help from another person taking care of personal grooming?: A Little Help from another person toileting, which includes using toliet, bedpan, or urinal?: A Little Help from another person bathing (including washing, rinsing, drying)?: A Lot Help from another person to put on and taking off regular upper body clothing?: A Lot Help from another person to put on and taking off regular lower body clothing?: A Little 6 Click Score: 16   End of Session Nurse Communication: Other (comment) (Pt malodorus)  Activity Tolerance: Patient tolerated treatment well;No increased pain Patient left: in chair;with chair alarm set;with nursing/sitter in room;with family/visitor present  OT Visit Diagnosis: Unsteadiness on feet (R26.81);Hemiplegia and hemiparesis;Muscle weakness (generalized) (M62.81) Hemiplegia - Right/Left: Right Hemiplegia - dominant/non-dominant: Dominant Hemiplegia - caused by: Cerebral infarction                Time: 1010-1054 OT Time Calculation (min): 44 min Charges:  OT General Charges $OT Visit: 1 Visit OT Evaluation $OT Eval Low Complexity: 1 Low OT Treatments $Self Care/Home Management : 8-22 mins $Therapeutic Activity: 8-22 mins  Anderson Malta, OT Acute Rehab  Services Office: [phone] 05/01/2022  Julien Girt 05/01/2022, 11:06 AM

## 2022-05-01 NOTE — Plan of Care (Signed)

## 2022-05-01 NOTE — Discharge Summary (Signed)
PATIENT DETAILS Name: Monique Henderson Age: 71 y.o. Sex: female Date of Birth: Mar 31, 1951 MRN: [id number]. Admitting Physician: Bonnielee Haff, MD [id number], Clyde Canterbury, MD  Admit Date: 04/29/2022 Discharge date: 05/01/2022  Recommendations for Outpatient Follow-up:  Follow up with PCP in 1-2 weeks Please obtain CMP/CBC in one week  Admitted From:  Home  Disposition: Home with outpatient therapy or home health at family's discretion.  (Note refused SNF)   Discharge Condition: fair  CODE STATUS:   Code Status: DNR   Diet recommendation:  Diet Order             Diet - low sodium heart healthy           DIET - DYS 1 Room service appropriate? Yes with Assist; Fluid consistency: Thin; Fluid restriction: 1200 mL Fluid  Diet effective now                    Brief Summary: Patient is a 71 y.o.  female with history of ESRD on HD TTS, PAF on Eliquis, recent hospitalization September 2355 for a embolic stroke, chronic HFpEF-who presented with acute hypoxic respiratory failure due to volume overload due to noncompliance with hemodialysis.   Significant events: 11/29>> admit to [facility]-hypoxia-volume overload.  Underwent emergent HD.   Significant studies: 11/29>> CXR: Findings consistent with volume overload/pulm edema.   Significant microbiology data: 11/29>> influenza/COVID PCR: Negative   Procedures: None   Consults: Nephrology  Brief Hospital Course: Acute hypoxic respiratory failure due to pulmonary edema/acute on chronic HFrEF in a setting of missed hemodialysis (POA) Significantly improved after HD x 2  Able to lie flat  No longer on oxygen  Counseled regarding importance of not missing outpatient HD.   Stable to be discharged-patient aware that she will need to resume outpatient HD as previous.    Hypertensive emergency in the setting of noncompliance to HD-volume overload BP much better after adjustment of medications on HD x 2.  Continue  Coreg/hydralazine at new dosage Resume prior dosage of Imdur/transdermal clonidine/amlodipine.  Better than yesterday but still on the higher side Optimize and adjust in the outpatient setting.   ESRD on HD TTS Nephrology followed closely-resume outpatient HD.   Normocytic anemia Secondary to CKD/ESRD EPO/Fe deferred to nephrology service   Hyperkalemia Secondary to noncompliance with HD Resolved with HD.   PAF with intermittent RVR No further episodes overnight " Dosage increased  Continue Eliquis.     Recent cardioembolic CVA with dysarthria/right residual hemiparesis Supportive care-evaluated by PT/OT-she does not want to go to SNF, claims she lives with family that provides 24/7 care-and wants to continue going to outpatient physical therapy. On Eliquis   Atypical chest pain No further chest pain overnight  Suspect atypical-possibly GERD  Continue PPI  Troponins minimally elevated-but this is chronic-and is similar to prior    History of aortic dissection-s/p repair    Prior history of cocaine use Claims she has not done cocaine in "a while"   Underweight: Estimated body mass index is 19.13 kg/m as calculated from the following:   Height as of 04/08/22: _0  (1.6 m).   Weight as of 04/20/22: 49 kg.    Discharge Diagnoses:  Principal Problem:   Acute respiratory failure with hypoxia (HCC) Active Problems:   Hypertensive emergency   ESRD (end stage renal disease) on dialysis with hyperkalemia and metabolic acidosis with elevated AG    AF (paroxysmal atrial fibrillation) (HCC)   Anemia   Discharge Instructions:  Activity:  As tolerated with Full fall precautions use walker/cane & assistance as needed Discharge Instructions     Call MD for:  difficulty breathing, headache or visual disturbances   Complete by: As directed    Diet - low sodium heart healthy   Complete by: As directed    Discharge instructions   Complete by: As directed    Follow with  Primary MD  Dorna Mai, MD in 1-2 weeks  Please resume your outpatient hemodialysis schedule as previous.  Please be compliant with your outpatient hemodialysis  Please get a complete blood count and chemistry panel checked by your Primary MD at your next visit, and again as instructed by your Primary MD.  Get Medicines reviewed and adjusted: Please take all your medications with you for your next visit with your Primary MD  Laboratory/radiological data: Please request your Primary MD to go over all hospital tests and procedure/radiological results at the follow up, please ask your Primary MD to get all Hospital records sent to his/her office.  In some cases, they will be blood work, cultures and biopsy results pending at the time of your discharge. Please request that your primary care M.D. follows up on these results.  Also Note the following: If you experience worsening of your admission symptoms, develop shortness of breath, life threatening emergency, suicidal or homicidal thoughts you must seek medical attention immediately by calling 911 or calling your MD immediately  if symptoms less severe.  You must read complete instructions/literature along with all the possible adverse reactions/side effects for all the Medicines you take and that have been prescribed to you. Take any new Medicines after you have completely understood and accpet all the possible adverse reactions/side effects.   Do not drive when taking Pain medications or sleeping medications (Benzodaizepines)  Do not take more than prescribed Pain, Sleep and Anxiety Medications. It is not advisable to combine anxiety,sleep and pain medications without talking with your primary care practitioner  Special Instructions: If you have smoked or chewed Tobacco  in the last 2 yrs please stop smoking, stop any regular Alcohol  and or any Recreational drug use.  Wear Seat belts while driving.  Please note: You were cared for by  a hospitalist during your hospital stay. Once you are discharged, your primary care physician will handle any further medical issues. Please note that NO REFILLS for any discharge medications will be authorized once you are discharged, as it is imperative that you return to your primary care physician (or establish a relationship with a primary care physician if you do not have one) for your post hospital discharge needs so that they can reassess your need for medications and monitor your lab values.   Increase activity slowly   Complete by: As directed    No dressing needed   Complete by: As directed       Allergies as of 05/01/2022       Reactions   Shrimp [shellfish Allergy] Shortness Of Breath   Bactroban [mupirocin] Other (See Comments)   "Sores in nose"   Hydrocodone Itching, Nausea Only, Nausea And Vomiting   Chlorhexidine Gluconate Itching   Tylenol [acetaminophen] Itching   Zestril [lisinopril] Cough        Medication List     TAKE these medications    albuterol 108 (90 Base) MCG/ACT inhaler Commonly known as: VENTOLIN HFA Inhale 2 puffs into the lungs every 6 (six) hours as needed for wheezing or shortness of breath.   amLODipine 10  MG tablet Commonly known as: NORVASC Take 1 tablet (10 mg total) by mouth daily.   apixaban 2.5 MG Tabs tablet Commonly known as: ELIQUIS Take 1 tablet (2.5 mg total) by mouth 2 (two) times daily.   carvedilol 25 MG tablet Commonly known as: COREG Take 1 tablet (25 mg total) by mouth 2 (two) times daily with a meal. What changed:  medication strength how much to take   cloNIDine 0.1 mg/24hr patch Commonly known as: CATAPRES - Dosed in mg/24 hr Place 1 patch (0.1 mg total) onto the skin every Saturday.   diphenhydrAMINE-zinc acetate cream Commonly known as: BENADRYL Apply topically 3 (three) times daily as needed for itching.   hydrALAZINE 25 MG tablet Commonly known as: APRESOLINE Take 2 tablets (50 mg total) by mouth 2  (two) times daily. What changed: how much to take   isosorbide mononitrate 30 MG 24 hr tablet Commonly known as: IMDUR Take 1 tablet (30 mg total) by mouth daily.   lidocaine 5 % Commonly known as: LIDODERM Place 1 patch onto the skin daily. Remove & Discard patch within 12 hours or as directed by MD   naloxone 4 MG/0.1ML Liqd nasal spray kit Commonly known as: NARCAN 1 spray as directed.   oxyCODONE-acetaminophen 10-325 MG tablet Commonly known as: PERCOCET Take 1 tablet by mouth 3 (three) times daily as needed.   pantoprazole 40 MG tablet Commonly known as: PROTONIX Take 1 tablet (40 mg total) by mouth 2 (two) times daily.   rosuvastatin 10 MG tablet Commonly known as: CRESTOR Take 1 tablet (10 mg total) by mouth daily.   sevelamer carbonate 2.4 g Pack Commonly known as: RENVELA Take 2.4 g by mouth 3 (three) times daily with meals.   traZODone 50 MG tablet Commonly known as: DESYREL Take 0.5 tablets (25 mg total) by mouth at bedtime as needed for sleep.   VITAMIN D-3 PO Take 1 capsule by mouth daily.               Discharge Care Instructions  (From admission, onward)           Start     Ordered   05/01/22 0000  No dressing needed        05/01/22 3818            Follow-up Information     Dorna Mai, MD. Schedule an appointment as soon as possible for a visit in 1 week(s).   Specialty: Family Medicine Contact information: [place] [address] [phone]         Hemodialysis center Follow up.   Why: Please resume your outpatient schedule as previous.               Allergies  Allergen Reactions   Shrimp [Shellfish Allergy] Shortness Of Breath   Bactroban [Mupirocin] Other (See Comments)    "Sores in nose"   Hydrocodone Itching, Nausea Only and Nausea And Vomiting   Chlorhexidine Gluconate Itching   Tylenol [Acetaminophen] Itching   Zestril [Lisinopril] Cough     Other  Procedures/Studies: DG Chest Portable 1 View  Result Date: 04/29/2022 CLINICAL DATA:  Worsening chest pain EXAM: PORTABLE CHEST 1 VIEW COMPARISON:  02/24/2022 FINDINGS: Single frontal view of the chest demonstrates stable right internal jugular dialysis catheter. Cardiac silhouette remains enlarged. There is increased central vascular congestion, with bilateral interstitial and ground-glass opacities consistent with fluid overload. No effusion or pneumothorax. No acute bony abnormalities. IMPRESSION: 1. Findings consistent with volume overload  and developing pulmonary edema. Electronically Signed   By: Randa Ngo M.D.   On: 04/29/2022 19:17     TODAY-DAY OF DISCHARGE:  Subjective:   Monique Henderson today has no headache,no chest abdominal pain,no new weakness tingling or numbness, feels much better wants to go home today.   Objective:   Blood pressure (!) 156/97, pulse 83, temperature 98 F (36.7 C), temperature source Oral, resp. rate 20, weight 48.1 kg, SpO2 96 %.  Intake/Output Summary (Last 24 hours) at 05/01/2022 0946 Last data filed at 05/01/2022 0900 Gross per 24 hour  Intake 240 ml  Output 1700 ml  Net -1460 ml   Filed Weights   04/30/22 0910 04/30/22 1833  Weight: 49 kg 48.1 kg    Exam: Awake Alert, Oriented *3, No new F.N deficits, Normal affect Barrett.AT,PERRAL Supple Neck,No JVD, No cervical lymphadenopathy appriciated.  Symmetrical Chest wall movement, Good air movement bilaterally, CTAB RRR,No Gallops,Rubs or new Murmurs, No Parasternal Heave +ve B.Sounds, Abd Soft, Non tender, No organomegaly appriciated, No rebound -guarding or rigidity. No Cyanosis, Clubbing or edema, No new Rash or bruise   PERTINENT RADIOLOGIC STUDIES: DG Chest Portable 1 View  Result Date: 04/29/2022 CLINICAL DATA:  Worsening chest pain EXAM: PORTABLE CHEST 1 VIEW COMPARISON:  02/24/2022 FINDINGS: Single frontal view of the chest demonstrates stable right internal jugular dialysis  catheter. Cardiac silhouette remains enlarged. There is increased central vascular congestion, with bilateral interstitial and ground-glass opacities consistent with fluid overload. No effusion or pneumothorax. No acute bony abnormalities. IMPRESSION: 1. Findings consistent with volume overload and developing pulmonary edema. Electronically Signed   By: Randa Ngo M.D.   On: 04/29/2022 19:17     PERTINENT LAB RESULTS: CBC: Recent Labs    04/29/22 2039 04/30/22 0621  WBC 9.5 6.2  HGB 8.6* 8.7*  HCT 26.7* 26.8*  PLT 249 225   CMET CMP     Component Value Date/Time   NA 136 05/01/2022 0350   NA 140 05/13/2020 1108   NA 142 09/28/2012 0831   K 3.9 05/01/2022 0350   K 4.2 09/28/2012 0831   CL 91 (L) 05/01/2022 0350   CL 103 09/28/2012 0831   CO2 29 05/01/2022 0350   CO2 30 (H) 09/28/2012 0831   GLUCOSE 94 05/01/2022 0350   GLUCOSE 103 (H) 09/28/2012 0831   BUN 29 (H) 05/01/2022 0350   BUN 47 (H) 05/13/2020 1108   BUN 18.3 09/28/2012 0831   CREATININE 5.26 (H) 05/01/2022 0350   CREATININE 2.09 (H) 01/18/2017 1145   CREATININE 1.2 (H) 09/28/2012 0831   CALCIUM 9.7 05/01/2022 0350   CALCIUM 9.4 09/28/2012 0831   PROT 7.4 04/29/2022 2039   PROT 6.9 05/13/2020 1108   PROT 7.2 09/28/2012 0831   ALBUMIN 3.3 (L) 05/01/2022 0350   ALBUMIN 4.0 05/13/2020 1108   ALBUMIN 3.5 09/28/2012 0831   AST 15 04/29/2022 2039   AST 35 (H) 09/28/2012 0831   ALT 15 04/29/2022 2039   ALT 23 12/09/2016 1458   ALT 41 09/28/2012 0831   ALKPHOS 82 04/29/2022 2039   ALKPHOS 89 09/28/2012 0831   BILITOT 0.7 04/29/2022 2039   BILITOT 0.2 05/13/2020 1108   BILITOT 0.34 09/28/2012 0831   GFRNONAA 8 (L) 05/01/2022 0350   GFRNONAA 23 (L) 12/09/2016 1458   GFRAA 8 (L) 05/13/2020 1108   GFRAA 26 (L) 12/09/2016 1458    GFR Estimated Creatinine Clearance: 7.7 mL/min (A) (by C-G formula based on SCr of 5.26 mg/dL (H)). No results  for input(s): "LIPASE", "AMYLASE" in the last 72 hours. No results  for input(s): "CKTOTAL", "CKMB", "CKMBINDEX", "TROPONINI" in the last 72 hours. Invalid input(s): "POCBNP" No results for input(s): "DDIMER" in the last 72 hours. No results for input(s): "HGBA1C" in the last 72 hours. No results for input(s): "CHOL", "HDL", "LDLCALC", "TRIG", "CHOLHDL", "LDLDIRECT" in the last 72 hours. No results for input(s): "TSH", "T4TOTAL", "T3FREE", "THYROIDAB" in the last 72 hours.  Invalid input(s): "FREET3" No results for input(s): "VITAMINB12", "FOLATE", "FERRITIN", "TIBC", "IRON", "RETICCTPCT" in the last 72 hours. Coags: No results for input(s): "INR" in the last 72 hours.  Invalid input(s): "PT" Microbiology: Recent Results (from the past 240 hour(s))  Resp Panel by RT-PCR (Flu A&B, Covid) Anterior Nasal Swab     Status: None   Collection Time: 04/29/22  7:02 PM   Specimen: Anterior Nasal Swab  Result Value Ref Range Status   SARS Coronavirus 2 by RT PCR NEGATIVE NEGATIVE Final    Comment: (NOTE) SARS-CoV-2 target nucleic acids are NOT DETECTED.  The SARS-CoV-2 RNA is generally detectable in upper respiratory specimens during the acute phase of infection. The lowest concentration of SARS-CoV-2 viral copies this assay can detect is 138 copies/mL. A negative result does not preclude SARS-Cov-2 infection and should not be used as the sole basis for treatment or other patient management decisions. A negative result may occur with  improper specimen collection/handling, submission of specimen other than nasopharyngeal swab, presence of viral mutation(s) within the areas targeted by this assay, and inadequate number of viral copies(<138 copies/mL). A negative result must be combined with clinical observations, patient history, and epidemiological information. The expected result is Negative.  Fact Sheet for Patients:  EntrepreneurPulse.com.au  Fact Sheet for Healthcare Providers:  IncredibleEmployment.be  This test  is no t yet approved or cleared by the [place] FDA and  has been authorized for detection and/or diagnosis of SARS-CoV-2 by FDA under an Emergency Use Authorization (EUA). This EUA will remain  in effect (meaning this test can be used) for the duration of the COVID-19 declaration under Section 564(b)(1) of the Act, 21 U.S.C.section 360bbb-3(b)(1), unless the authorization is terminated  or revoked sooner.       Influenza A by PCR NEGATIVE NEGATIVE Final   Influenza B by PCR NEGATIVE NEGATIVE Final    Comment: (NOTE) The Xpert Xpress SARS-CoV-2/FLU/RSV plus assay is intended as an aid in the diagnosis of influenza from Nasopharyngeal swab specimens and should not be used as a sole basis for treatment. Nasal washings and aspirates are unacceptable for Xpert Xpress SARS-CoV-2/FLU/RSV testing.  Fact Sheet for Patients: EntrepreneurPulse.com.au  Fact Sheet for Healthcare Providers: IncredibleEmployment.be  This test is not yet approved or cleared by the [place] FDA and has been authorized for detection and/or diagnosis of SARS-CoV-2 by FDA under an Emergency Use Authorization (EUA). This EUA will remain in effect (meaning this test can be used) for the duration of the COVID-19 declaration under Section 564(b)(1) of the Act, 21 U.S.C. section 360bbb-3(b)(1), unless the authorization is terminated or revoked.  Performed at [facility], [place] [address], [place], [place] [address]     FURTHER DISCHARGE INSTRUCTIONS:  Get Medicines reviewed and adjusted: Please take all your medications with you for your next visit with your Primary MD  Laboratory/radiological data: Please request your Primary MD to go over all hospital tests and procedure/radiological results at the follow up, please ask your Primary MD to get all Hospital records sent to his/her office.  In some cases, they will be blood work, cultures and biopsy results  pending at the time of your discharge. Please request that your primary care M.D. goes through all the records of your hospital data and follows up on these results.  Also Note the following: If you experience worsening of your admission symptoms, develop shortness of breath, life threatening emergency, suicidal or homicidal thoughts you must seek medical attention immediately by calling 911 or calling your MD immediately  if symptoms less severe.  You must read complete instructions/literature along with all the possible adverse reactions/side effects for all the Medicines you take and that have been prescribed to you. Take any new Medicines after you have completely understood and accpet all the possible adverse reactions/side effects.   Do not drive when taking Pain medications or sleeping medications (Benzodaizepines)  Do not take more than prescribed Pain, Sleep and Anxiety Medications. It is not advisable to combine anxiety,sleep and pain medications without talking with your primary care practitioner  Special Instructions: If you have smoked or chewed Tobacco  in the last 2 yrs please stop smoking, stop any regular Alcohol  and or any Recreational drug use.  Wear Seat belts while driving.  Please note: You were cared for by a hospitalist during your hospital stay. Once you are discharged, your primary care physician will handle any further medical issues. Please note that NO REFILLS for any discharge medications will be authorized once you are discharged, as it is imperative that you return to your primary care physician (or establish a relationship with a primary care physician if you do not have one) for your post hospital discharge needs so that they can reassess your need for medications and monitor your lab values.  Total Time spent coordinating discharge including counseling, education and face to face time equals greater than 30 minutes.  SignedOren Binet 05/01/2022 9:46  AM

## 2022-05-01 NOTE — TOC Transition Note (Signed)
Transition of Care [facility]) - CM/SW Discharge Note   Patient Details  Name: Monique Henderson MRN: [id number] Date of Birth: Oct 23, 1950  [facility]) CM/SW Contact:  Zenon Mayo, RN Phone Number: 05/01/2022, 11:01 AM   Clinical Narrative:    Patient is for dc today, family member is in the room to transport her home. NCM sent referral to [facility] outpatient physical therapy for PT and OT.  Family states her Medicaid is getting reinstated so she does not have Medicaid right now.  Family member will go to [place] to purchase a shower chair for patient.           Patient Goals and CMS Choice        Discharge Placement                       Discharge Plan and Services                                     Social Determinants of Health (SDOH) Interventions     Readmission Risk Interventions    01/09/2022    4:28 PM  Readmission Risk Prevention Plan  Transportation Screening Complete  Medication Review ([place]) Complete  PCP or Specialist appointment within 3-5 days of discharge Complete  HRI or [place] Complete  SW Recovery Care/Counseling Consult Complete  Palliative Care Screening Complete  [place] Not Applicable

## 2022-05-01 NOTE — Progress Notes (Signed)
Explained discharge instructions to patient. Reviewed follow up appointment and next medication administration times. Also reviewed education. Patient verbalized having an understanding for instructions given. All belongings are in the patient's possession. IV and telemetry were removed. CCMD was notified. No other needs verbalized. Transported downstairs for discharge. 

## 2022-05-01 NOTE — Care Management Obs Status (Signed)
MEDICARE OBSERVATION STATUS NOTIFICATION   Patient Details  Name: Monique Henderson MRN: [id number] Date of Birth: 07-12-1950   Medicare Observation Status Notification Given:  Yes    Joanne Chars, LCSW 05/01/2022, 8:43 AM

## 2022-05-01 NOTE — Evaluation (Signed)
Physical Therapy Evaluation Patient Details Name: Monique Henderson MRN: [id number] DOB: 06/06/1950 Today's Date: 05/01/2022  History of Present Illness  Pt is a 71 y/o F admitted on 04/29/22 after presenting with acute hypoxic respiratory failure due to volume overload due to noncompliance with HD. PMH: ESRD on HD TTS, PAF on eliquis, hospitalization in September 1975 for embolic stroke, chronic HFpEF  Clinical Impression  Pt seen for PT evaluation with pt agreeable to tx. Pt reports she lives in an apartment on the 2nd level but has a chair/stair lift to access it. Pt reports she lives with her boyfriend, was attending OPPT, and denies falls while ambulating without AD prior to admission.  On this date, pt is mod I for bed mobility & can ambulate around unit without AD with CGA fade to supervision. Pt demonstrates decreased step length BLE & decreased stride length, but fair balance overall. Anticipate pt can d/c home with assistance & resume OPPT f/u therapies.  Pt reports she missed HD appointment 2/2 R hand pain but reports she does not intend to miss HD again as she is aware of how detrimental it can be.    Recommendations for follow up therapy are one component of a multi-disciplinary discharge planning process, led by the attending physician.  Recommendations may be updated based on patient status, additional functional criteria and insurance authorization.  Follow Up Recommendations Outpatient PT (resume OPPT services)      Assistance Recommended at Discharge PRN  Patient can return home with the following  A little help with walking and/or transfers;A little help with bathing/dressing/bathroom;Assistance with cooking/housework;Assist for transportation;Help with stairs or ramp for entrance    Equipment Recommendations None recommended by PT  Recommendations for Other Services       Functional Status Assessment Patient has had a recent decline in their functional status and  demonstrates the ability to make significant improvements in function in a reasonable and predictable amount of time.     Precautions / Restrictions Precautions Precautions: Fall Restrictions Weight Bearing Restrictions: No      Mobility  Bed Mobility Overal bed mobility: Modified Independent             General bed mobility comments: supine>sit with HOB elevated    Transfers Overall transfer level: Modified independent Equipment used: None               General transfer comment: STS without AD with BUE    Ambulation/Gait Ambulation/Gait assistance: Min guard, Supervision Gait Distance (Feet): 200 Feet Assistive device: None Gait Pattern/deviations: Decreased step length - left, Decreased step length - right, Decreased stride length Gait velocity: decreased     General Gait Details: CGA fade to supervision for gait  Stairs            Wheelchair Mobility    Modified Rankin (Stroke Patients Only)       Balance Overall balance assessment: Needs assistance Sitting-balance support: No upper extremity supported Sitting balance-Leahy Scale: Good     Standing balance support: During functional activity, No upper extremity supported Standing balance-Leahy Scale: Fair                               Pertinent Vitals/Pain Pain Assessment Pain Assessment: No/denies pain    Home Living Family/patient expects to be discharged to:: Private residence Living Arrangements:  (boyfriend) Available Help at Discharge: Family;Available 24 hours/day Type of Home: Apartment Home Access: Stairs to enter  Entrance Stairs-Number of Steps: pt reports flight of stairs but notes theres a Teacher, music   Home Layout: One level        Prior Function Prior Level of Function : Independent/Modified Independent             Mobility Comments: Pt reports she has been ambulating without AD, denies falls, attending OPPT.       Hand Dominance         Extremity/Trunk Assessment   Upper Extremity Assessment Upper Extremity Assessment: RUE deficits/detail RUE Deficits / Details: Pt with coban around R wrist & digits of R hand, pt reports outpatient therapist did this to attempt to assist with edema management.    Lower Extremity Assessment Lower Extremity Assessment: Generalized weakness    Cervical / Trunk Assessment Cervical / Trunk Assessment: Normal  Communication   Communication: Expressive difficulties (2/2 CVA in december)  Cognition Arousal/Alertness: Awake/alert Behavior During Therapy: WFL for tasks assessed/performed Overall Cognitive Status: Within Functional Limits for tasks assessed                                 General Comments: AxOx4, follows commands throughout session        General Comments General comments (skin integrity, edema, etc.): SPO2 >90% on room air    Exercises Other Exercises Other Exercises: Pt performed 10x STS without BUE support with close supervision<>CGA with PT providing cuing for anterior weight shifting, scooting out to reduce BLE lean posteriorly onto chair   Assessment/Plan    PT Assessment Patient needs continued PT services  PT Problem List Decreased strength;Decreased coordination;Decreased activity tolerance;Decreased range of motion;Decreased balance;Decreased mobility       PT Treatment Interventions Therapeutic exercise;DME instruction;Gait training;Balance training;Stair training;Neuromuscular re-education;Functional mobility training;Therapeutic activities;Patient/family education    PT Goals (Current goals can be found in the Care Plan section)  Acute Rehab PT Goals Patient Stated Goal: go home PT Goal Formulation: With patient Time For Goal Achievement: 05/15/22 Potential to Achieve Goals: Good    Frequency Min 3X/week     Co-evaluation               AM-PAC PT "6 Clicks" Mobility  Outcome Measure Help needed turning from your back  to your side while in a flat bed without using bedrails?: None Help needed moving from lying on your back to sitting on the side of a flat bed without using bedrails?: None Help needed moving to and from a bed to a chair (including a wheelchair)?: A Little Help needed standing up from a chair using your arms (e.g., wheelchair or bedside chair)?: A Little Help needed to walk in hospital room?: A Little Help needed climbing 3-5 steps with a railing? : A Little 6 Click Score: 20    End of Session   Activity Tolerance: Patient tolerated treatment well Patient left: in chair;with chair alarm set;with call bell/phone within reach Nurse Communication: Mobility status PT Visit Diagnosis: Unsteadiness on feet (R26.81);Other abnormalities of gait and mobility (R26.89)    Time: 4270-6237 PT Time Calculation (min) (ACUTE ONLY): 12 min   Charges:   PT Evaluation $PT Eval Moderate Complexity: [place], PT, DPT 05/01/22, 10:11 AM   Waunita Schooner 05/01/2022, 10:10 AM

## 2022-05-03 NOTE — TOC Transition Note (Signed)
Transition of care contact from inpatient facility  Date of discharge: 05/01/22 Date of contact: 05/03/22 Method: Attempted Phone Call Spoke to: No Answer  Called patient's son to discuss transition of care from recent inpatient hospitalization but he didn't pick up the phone. A voicemail was left to contact [facility] for any questions/concerns.   Patient received HD on 05/02/22. Next HD on 05/05/22.  Tobie Poet, NP

## 2022-05-04 ENCOUNTER — Encounter: Payer: Self-pay | Admitting: Rehabilitative and Restorative Service Providers"

## 2022-05-04 ENCOUNTER — Ambulatory Visit
Payer: Medicare Other | Attending: Physical Medicine and Rehabilitation | Admitting: Rehabilitative and Restorative Service Providers"

## 2022-05-04 ENCOUNTER — Encounter: Payer: Self-pay | Admitting: Physician Assistant

## 2022-05-04 ENCOUNTER — Telehealth: Payer: Self-pay

## 2022-05-04 ENCOUNTER — Ambulatory Visit: Payer: Medicare Other | Admitting: Speech Pathology

## 2022-05-04 DIAGNOSIS — M6281 Muscle weakness (generalized): Secondary | ICD-10-CM | POA: Diagnosis present

## 2022-05-04 DIAGNOSIS — M25631 Stiffness of right wrist, not elsewhere classified: Secondary | ICD-10-CM | POA: Diagnosis present

## 2022-05-04 DIAGNOSIS — R41841 Cognitive communication deficit: Secondary | ICD-10-CM

## 2022-05-04 DIAGNOSIS — I639 Cerebral infarction, unspecified: Secondary | ICD-10-CM | POA: Insufficient documentation

## 2022-05-04 DIAGNOSIS — M25641 Stiffness of right hand, not elsewhere classified: Secondary | ICD-10-CM

## 2022-05-04 DIAGNOSIS — R600 Localized edema: Secondary | ICD-10-CM | POA: Diagnosis present

## 2022-05-04 DIAGNOSIS — R278 Other lack of coordination: Secondary | ICD-10-CM

## 2022-05-04 DIAGNOSIS — R471 Dysarthria and anarthria: Secondary | ICD-10-CM | POA: Diagnosis present

## 2022-05-04 DIAGNOSIS — R262 Difficulty in walking, not elsewhere classified: Secondary | ICD-10-CM | POA: Diagnosis present

## 2022-05-04 NOTE — Telephone Encounter (Signed)
Transition Care Management Unsuccessful Follow-up Telephone Call  Date of discharge and from where:  05/01/2022, [facility]   Attempts:  1st Attempt  Reason for unsuccessful TCM follow-up call:  Left voice message  [phone], call back requested

## 2022-05-04 NOTE — Therapy (Signed)
OUTPATIENT SPEECH LANGUAGE PATHOLOGY EVALUATION   Patient Name: Monique Henderson MRN: [id number] DOB:[birth year], 71 y.o., female Today's Date: 05/04/2022  PCP: Dr. Dorna Mai REFERRING PROVIDER: PA-C Reesa Chew   End of Session - 05/04/22 1014     Visit Number 4    Number of Visits 13    Date for SLP Re-Evaluation 07/07/22    Authorization Type [employer] Medicare    Progress Note Due on Visit 10    SLP Start Time 1014    SLP Stop Time  1057    SLP Time Calculation (min) 43 min    Activity Tolerance Patient tolerated treatment well                Past Medical History:  Diagnosis Date   AF (paroxysmal atrial fibrillation) ([place]) 05/29/2019   on Coumadin   Aortic atherosclerosis ([place]) 07/05/2019   Aortic dissection ([place]) 04/04/2019   s/p repair   Bone spur 2008   Right calcaneal foot spur   Breast cancer ([place]) 2004   Ductal carcinoma in situ of the left breast; S/P left partial mastectomy 02/26/2003; S/P re-excision of cranial and lateral margins11/18/2004.radiation   Cerebral thrombosis with cerebral infarction 05/22/2019   Chronic low back pain 06/22/2016   Chronic obstructive lung disease ([place]) 01/16/2017   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 Autumn Messing; S/P re-excision of superior and inferior margins 10/27/2012.    ESRD on hemodialysis ([place]) 05/29/2019   Essential hypertension 09/16/2006   GERD 09/16/2006   Hepatitis C    treated and RNA confirmed not detectable 01/2017   Insomnia 03/14/2015   Malnutrition of moderate degree 05/19/2019   Non compliance with medical treatment 12/04/2017   Normocytic anemia    With thrombocytosis   Osteoarthritis    Right ureteral stone 2002   S/P lumbar spinal fusion 01/18/2014   S/P lumbar decompressive laminectomy, fusion, and plating for lumbar spinal stenosis on 05/27/2009 by Dr. Eustace Moore.  S/P anterolateral retroperitoneal interbody fusion L2-3 utilizing  a 8 mm peek interbody cage packed with morcellized allograft, and anterior lumbar plating L2-3 for recurrent disc herniation L2-3 with spinal stenosis on 01/18/2014 by Dr. Eustace Moore.     Tobacco use disorder 04/19/2009   Uterine fibroid    Wears dentures    top   Past Surgical History:  Procedure Laterality Date   ANTERIOR LAT LUMBAR FUSION N/A 01/18/2014   Procedure: ANTERIOR LATERAL LUMBAR FUSION LUMBAR TWO-THREE;  Surgeon: Eustace Moore, MD;  Location: [place] [facility];  Service: Neurosurgery;  Laterality: N/A;   ANTERIOR LUMBAR FUSION  01/18/2014   AV FISTULA PLACEMENT Left 04/20/2019   Procedure: ARTERIOVENOUS (AV) FISTULA CREATION;  Surgeon: [name], MD;  Location: [place];  Service: Vascular;  Laterality: Left;   BACK SURGERY     BREAST LUMPECTOMY Left 01/2003   BREAST LUMPECTOMY Right 2014   BREAST LUMPECTOMY WITH NEEDLE LOCALIZATION AND AXILLARY SENTINEL LYMPH NODE BX Right 10/13/2012   Procedure: BREAST LUMPECTOMY WITH NEEDLE LOCALIZATION;  Surgeon: [name], MD;  Location: [place];  Service: General;  Laterality: Right;  Right breast wire localized lumpectomy   INSERTION OF DIALYSIS CATHETER Right 04/20/2019   Procedure: INSERTION OF DIALYSIS CATHETER, right internal jugular;  Surgeon: [name], MD;  Location: [place];  Service: Vascular;  Laterality: Right;   INSERTION OF DIALYSIS CATHETER Right 09/24/2020   Procedure: INSERTION OF TUNNELED  DIALYSIS CATHETER;  Surgeon: Rosetta Posner, MD;  Location: [facility] OR;  Service: Vascular;  Laterality: Right;   IR FLUORO GUIDE CV LINE RIGHT  09/22/2020   IR THORACENTESIS ASP PLEURAL SPACE W/IMG GUIDE  05/19/2019   IR US GUIDE VASC ACCESS LEFT  09/22/2020   IR US GUIDE VASC ACCESS RIGHT  09/22/2020   IR VENOCAVAGRAM SVC  09/22/2020   LAMINECTOMY  05/27/2009   Lumbar decompressive laminectomy, fusion and plating for lumbar spinal stensosis   LIGATION OF ARTERIOVENOUS  FISTULA Left 09/24/2020    Procedure: LIGATION OF LEFT ARM ARTERIOVENOUS  FISTULA;  Surgeon: Rosetta Posner, MD;  Location: [place];  Service: Vascular;  Laterality: Left;   LUMBAR LAMINECTOMY/DECOMPRESSION MICRODISCECTOMY Left 03/23/2013   Procedure: LUMBAR LAMINECTOMY/DECOMPRESSION MICRODISCECTOMY 1 LEVEL;  Surgeon: Eustace Moore, MD;  Location: [place] [facility];  Service: Neurosurgery;  Laterality: Left;  LUMBAR LAMINECTOMY/DECOMPRESSION MICRODISCECTOMY 1 LEVEL   MASTECTOMY, PARTIAL Left 02/26/2003   ; S/P re-excision of cranial and lateral margins 04/19/2003.    RE-EXCISION OF BREAST CANCER,SUPERIOR MARGINS Right 10/27/2012   Procedure: RE-EXCISION OF BREAST CANCER,SUPERIOR and inferior MARGINS;  Surgeon: [name], MD;  Location: [place];  Service: General;  Laterality: Right;   RE-EXCISION OF BREAST LUMPECTOMY Left 04/2003   TEE WITHOUT CARDIOVERSION N/A 04/04/2019   Procedure: Transesophageal Echocardiogram ([place]);  Surgeon: Wonda Olds, MD;  Location: [place];  Service: Open Heart Surgery;  Laterality: N/A;   THORACIC AORTIC ANEURYSM REPAIR N/A 04/04/2019   Procedure: THORACIC ASCENDING ANEURYSM REPAIR (AAA)  USING 28 MM X 30 CM HEMASHIELD PLATINUM VASCULAR GRAFT;  Surgeon: Wonda Olds, MD;  Location: [facility] OR;  Service: Open Heart Surgery;  Laterality: N/A;   Patient Active Problem List   Diagnosis Date Noted   Right hemiparesis ([place]) 04/06/2022   Acute cardioembolic stroke ([place]) 47/65/4650   Stroke (cerebrum) ([place]) 02/23/2022   Stroke ([place]) 02/23/2022   Hypervolemia associated with renal insufficiency 01/29/2022   Dyslipidemia 01/29/2022   DNR (do not resuscitate) 01/29/2022   Polysubstance abuse ([place]) 01/06/2022   Acute on chronic combined systolic and diastolic CHF (congestive heart failure) ([place]) 01/06/2022   Abdominal pain 35/46/5681   Acute metabolic encephalopathy 27/51/7001   Malignant HTN with heart disease, w/o CHF, 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 12/24/2021   History of hepatitis C 12/08/2021    Hypertensive urgency 10/29/2021   Fluid overload 10/29/2021   Acute on chronic HFrEF (heart failure with reduced ejection fraction) ([place]) 10/29/2021   Chronic anticoagulation 10/29/2021   Prolonged QT interval 10/29/2021   Leukopenia 10/29/2021   SOB (shortness of breath) 09/15/2021   Hypertensive emergency 08/23/2021   Altered mental status    ESRD (end stage renal disease) on dialysis with hyperkalemia and metabolic acidosis with elevated AG  08/12/2021   Malignant hypertension 06/19/2021   Anemia 06/05/2021   Chronic diastolic congestive heart failure ([place]) 04/30/2021   Pure hypercholesterolemia 02/14/2021   Acute pulmonary edema ([place]) 12/23/2020   Acute respiratory failure with hypoxia ([place]) 09/21/2020   Acute renal failure superimposed on chronic kidney disease ([place]) 09/21/2020   Community acquired pneumonia 09/21/2020   Uremia 74/94/4967   Metabolic acidosis with increased anion gap and accumulation of organic acids 09/21/2020   Hypertensive crisis 09/21/2020   Troponin level elevated 08/28/2019   Positive D dimer 08/28/2019   Hyperkalemia 08/28/2019   SVT (supraventricular tachycardia) 08/28/2019   SIRS (systemic inflammatory response syndrome) ([place]) 08/27/2019   Paroxysmal SVT (supraventricular tachycardia) 08/27/2019   Aortic  atherosclerosis ([place]) 07/05/2019   Chest pain 07/05/2019   History of CVA (cerebrovascular accident) 05/29/2019   AF (paroxysmal atrial fibrillation) ([place]) 05/29/2019   ESRD on dialysis ([place]) 05/29/2019   Cerebral thrombosis with cerebral infarction 05/22/2019   Malnutrition of moderate degree 05/19/2019   Pleural effusion 05/18/2019   Atrial fibrillation with RVR ([place]) 05/18/2019   S/P aortic aneurysm repair 04/07/2019   Aortic dissection ([place]) 04/04/2019   Dissection of aorta ([place]) 04/03/2019   Non compliance with medical treatment 12/04/2017   Chronic obstructive lung disease ([place]) 01/16/2017   Chronic low back pain 06/22/2016   Insomnia  03/14/2015   S/P lumbar spinal fusion 01/18/2014   Tobacco use disorder 04/19/2009   GERD 09/16/2006    ONSET DATE: 02/23/2022   REFERRING DIAG: I63.9 (ICD-10-CM) - Cerebral infarction, unspecified   THERAPY DIAG:  Cognitive communication deficit  Dysarthria and anarthria  Rationale for Evaluation and Treatment: Rehabilitation  SUBJECTIVE:   SUBJECTIVE STATEMENT: Pt hospitalized 04/29/2022 to 05/01/2022 d/t difficulty breathing. CSE completed with pt put on puree diet d/t lethargy, no overt s/sx of cognitive, speech, or swallowing changes since admission. SLP reviewed documentation from visit, to include CT chest scan (IMPRESSION: 1. Findings consistent with volume overload and developing pulmonary edema)  PAIN:  Are you having pain? denies   OBJECTIVE:   TODAY'S TREATMENT: 05-04-22: SLP educated on diet recommendations since pt was placed on alternative diet while recently admitted to hospital. Education provided on swallowing strategies and compensations to aid in continued ability to enjoy preferred foods at home. Handout provided, pt verbalizes understanding, denies questions regarding recommendations. Target employment of dysarthria strategies- syllabification, increased intensity, reduced rate- at generative sentence level with 3 syllable words containing consonant blends. At single word, reading, level 90% accuracy initial attempt. 70% accuracy at generative sentence level initial attempt with occasional min-A. Intermittent ability to self-monitor for accuracy of production with pt able to self correct x4. Pt read 2 paragraph reading with many /sk/ blends, demonstrating carryover of reduced rate and over articulation with mod-I. Rare self-correction evidences pt with awareness of production during this exercise. Averages 71 dB during paragraph reading.   04-27-22: Pt reports to improved communication across variety of communication partners. In opening conversation of 10 minutes,  pt evidences reduced rate and syllabification with rare need for request for repetition. SLP provided education and modeling of dysarthria strategies of slowed rate and over articulation. Following, pt provided with oral reading passage. Achieved 70% intelligibility initial attempt. With re-education and coaching on tricky words, pt able to achieve 90% accuracy on second attempt of same stimuli Novel stimuli, pt employs strategies and achieves 88% intelligibility on initial attempt. Education on topic maintenance or context to aid in communication efficacy.   04-20-22: SLP provided additional education on dysarthria strategies to aid in overall intelligibility with focus this date on slowed rate, overarticulation, and increased vocal intensity. Given tri-syllabic words containing /s/ in all positions, pt able to produce at word level 85% accuracy on intial attempt; produce in generative sentence 75% accuracy. SLP provides occasional mod-A for use of strategies with occasional requirement for direct model. Pt demonstrates spontaneous self correction x4 over course of structured practice. At conclusion of session, pt ID "break up my words" as strategy which A in improved intelligibility.    04-13-22: SLP provided education on evaluation observations and recommendations for therapy course. Initiated training re: dysarthria compensations and strategies to aid in improved communication efficacy. Collaborated with pt to establish POC and goals. Pt  in agreement with POC and denies questions at conclusion of evaluation.    PATIENT EDUCATION: Education details: see above Person educated: Patient and sister Education method: Customer service manager Education comprehension: verbalized understanding, returned demonstration, and needs further education   GOALS: Goals reviewed with patient? Yes  SHORT TERM GOALS: Target date: 05/05/2022  Pt will intelligibly produce multi-syllabic words containing blends  at generative sentence level in 18/20 trials with rare min-A Baseline: 05/04/22 Goal status: PARTIALLY MET  2.  Pt will teach back strategies and compensations to aid in communication efficacy with various communication partners  Baseline: 04/27/22 Goal status: MET  3.  Pt will maintain 68+ dB average during 5 minute conversation with occasional mod-A over 2 sessions Baseline: 05/04/22 Goal status: MET  4.  Pt will read 1 paragraph with use of compensations PRN, demonstrating carryover of dysarthria strategies with rare min-A Baseline: 05/04/22 Goal status: MET   LONG TERM GOALS: Target date: 07/07/2022  Pt will report BID dysarthria home practice 5/7 days over 1 week period with mod-I Baseline:  Goal status: INITIAL  2.  Pt will maintain 68+ dB average over 20 minute conversation with occasional min-A Baseline:  Goal status: INITIAL  3.  Pt will complete mult-paragraph oral reading, demonstrating carryover of dysarthria strategies with rare min-A Baseline:  Goal status: INITIAL  4.  Pt will report improve subjective perception of speech efficacy via patient reported outcome measure by d/c  Baseline: 21 Goal status: INITIAL   ASSESSMENT:  CLINICAL IMPRESSION: Patient is a 71 y.o. 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 Session focused on employment of dysarthria compensations in progressively difficult speech tasks to aid in generalization to conversational speech. Increased accuracy at sentence generation level of 3 syllable words with blends. Pt evidencing emerging skill in self-monitoring and correction. Continue per POC.   OBJECTIVE IMPAIRMENTS: include dysarthria. These impairments are limiting patient from effectively communicating at home and in community. Factors affecting potential to achieve goals and functional outcome are severity of impairments. Patient will benefit from skilled SLP services to address above impairments and  improve overall function.  REHAB POTENTIAL: Good  PLAN:  SLP FREQUENCY: 1x/week  SLP DURATION: 12 weeks  PLANNED INTERVENTIONS: Language facilitation, Cueing hierachy, Internal/external aids, Functional tasks, Multimodal communication approach, SLP instruction and feedback, Compensatory strategies, and Patient/family education    Su Monks, CCC-SLP 05/04/2022, 10:14 AM

## 2022-05-04 NOTE — Patient Instructions (Signed)
Nothing to suggest that your swallow function has changed.   Would recommend you continue eating foods in which you enjoy without limitation  Some helpful strategies: Chew thoroughly Take a drink every few sips to clear everything out  Small bites, small sips Limit distractions while eating  Some foods are going to be harder to swallow: meats, popcorn, dry bread, etc These you have to work extra hard to chew, chew, chew Consider adding a gravy or a sauce to moisten  Make sure these are in small bites (smaller bites than you think you need)  What to look for:  *if any of these happen, please let your SLP know Frequent coughing while eating or drinking Hard time swallowing Feeling food or liquid stick in throat often

## 2022-05-05 ENCOUNTER — Telehealth: Payer: Self-pay

## 2022-05-05 NOTE — Telephone Encounter (Signed)
Transition Care Management Unsuccessful Follow-up Telephone Call  Date of discharge and from where:  05/01/2022, [facility]  Attempts:  2nd Attempt  Reason for unsuccessful TCM follow-up call:  Left voice message [phone], call back requested

## 2022-05-05 NOTE — Therapy (Signed)
OUTPATIENT OCCUPATIONAL THERAPY TREATMENT NOTE  Patient Name: Monique Henderson MRN: [id number] DOB:Nov 09, 1950, 71 y.o., female Today's Date: 05/06/2022  PCP: N/A REFERRING PROVIDER: Bary Leriche, PA-C    OT End of Session - 05/06/22 1432     Visit Number 7    Number of Visits 12    Date for OT Re-Evaluation 05/15/22    Authorization Type [employer] Medicare    Progress Note Due on Visit 10    OT Start Time 1432    OT Stop Time 1520    OT Time Calculation (min) 48 min    Activity Tolerance Patient tolerated treatment well;No increased pain;Patient limited by fatigue    Behavior During Therapy [facility] for tasks assessed/performed             Past Medical History:  Diagnosis Date   AF (paroxysmal atrial fibrillation) ([place]) 05/29/2019   on Coumadin   Aortic atherosclerosis ([place]) 07/05/2019   Aortic dissection ([place]) 04/04/2019   s/p repair   Bone spur 2008   Right calcaneal foot spur   Breast cancer ([place]) 2004   Ductal carcinoma in situ of the left breast; S/P left partial mastectomy 02/26/2003; S/P re-excision of cranial and lateral margins11/18/2004.radiation   Carotid artery disease (HCC)    Cerebral thrombosis with cerebral infarction 05/22/2019   Chronic HFrEF (heart failure with reduced ejection fraction) (HCC)    Chronic low back pain 06/22/2016   Chronic obstructive lung disease ([place]) 01/16/2017   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 Autumn Messing; S/P re-excision of superior and inferior margins 10/27/2012.    ESRD on hemodialysis ([place]) 05/29/2019   Essential hypertension 09/16/2006   GERD 09/16/2006   Hepatitis C    treated and RNA confirmed not detectable 01/2017   Insomnia 03/14/2015   Malnutrition of moderate degree 05/19/2019   Non compliance with medical treatment 12/04/2017   Normocytic anemia    With thrombocytosis   Osteoarthritis    Right ureteral stone 2002   S/P lumbar spinal fusion 01/18/2014    S/P lumbar decompressive laminectomy, fusion, and plating for lumbar spinal stenosis on 05/27/2009 by Dr. Eustace .  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 Eustace .     Stroke (cerebrum) ([place])    Tobacco use disorder 04/19/2009   Uterine fibroid    Wears dentures    top   Past Surgical History:  Procedure Laterality Date   ANTERIOR LAT LUMBAR FUSION N/A 01/18/2014   Procedure: ANTERIOR LATERAL LUMBAR FUSION LUMBAR TWO-THREE;  Surgeon: Eustace , MD;  Location: [place] [facility];  Service: Neurosurgery;  Laterality: N/A;   ANTERIOR LUMBAR FUSION  01/18/2014   AV FISTULA PLACEMENT Left 04/20/2019   Procedure: ARTERIOVENOUS (AV) FISTULA CREATION;  Surgeon: [name], MD;  Location: [place];  Service: Vascular;  Laterality: Left;   BACK SURGERY     BREAST LUMPECTOMY Left 01/2003   BREAST LUMPECTOMY Right 2014   BREAST LUMPECTOMY WITH NEEDLE LOCALIZATION AND AXILLARY SENTINEL LYMPH NODE BX Right 10/13/2012   Procedure: BREAST LUMPECTOMY WITH NEEDLE LOCALIZATION;  Surgeon: [name], MD;  Location: [place];  Service: General;  Laterality: Right;  Right breast wire localized lumpectomy   INSERTION OF DIALYSIS CATHETER Right 04/20/2019   Procedure: INSERTION  OF DIALYSIS CATHETER, right internal jugular;  Surgeon: [name], MD;  Location: [place];  Service: Vascular;  Laterality: Right;   INSERTION OF DIALYSIS CATHETER Right 09/24/2020   Procedure: INSERTION OF TUNNELED DIALYSIS CATHETER;  Surgeon: Rosetta Posner, MD;  Location: [place];  Service: Vascular;  Laterality: Right;   IR FLUORO GUIDE CV LINE RIGHT  09/22/2020   IR THORACENTESIS ASP PLEURAL SPACE W/IMG GUIDE  05/19/2019   IR US GUIDE VASC ACCESS LEFT  09/22/2020   IR US GUIDE VASC ACCESS  RIGHT  09/22/2020   IR VENOCAVAGRAM SVC  09/22/2020   LAMINECTOMY  05/27/2009   Lumbar decompressive laminectomy, fusion and plating for lumbar spinal stensosis   LIGATION OF ARTERIOVENOUS  FISTULA Left 09/24/2020   Procedure: LIGATION OF LEFT ARM ARTERIOVENOUS  FISTULA;  Surgeon: Rosetta Posner, MD;  Location: [place];  Service: Vascular;  Laterality: Left;   LUMBAR LAMINECTOMY/DECOMPRESSION MICRODISCECTOMY Left 03/23/2013   Procedure: LUMBAR LAMINECTOMY/DECOMPRESSION MICRODISCECTOMY 1 LEVEL;  Surgeon: Eustace , MD;  Location: [place] [facility];  Service: Neurosurgery;  Laterality: Left;  LUMBAR LAMINECTOMY/DECOMPRESSION MICRODISCECTOMY 1 LEVEL   MASTECTOMY, PARTIAL Left 02/26/2003   ; S/P re-excision of cranial and lateral margins 04/19/2003.    RE-EXCISION OF BREAST CANCER,SUPERIOR MARGINS Right 10/27/2012   Procedure: RE-EXCISION OF BREAST CANCER,SUPERIOR and inferior MARGINS;  Surgeon: [name], MD;  Location: [place];  Service: General;  Laterality: Right;   RE-EXCISION OF BREAST LUMPECTOMY Left 04/2003   TEE WITHOUT CARDIOVERSION N/A 04/04/2019   Procedure: Transesophageal Echocardiogram ([place]);  Surgeon: Wonda Olds, MD;  Location: [place];  Service: Open Heart Surgery;  Laterality: N/A;   THORACIC AORTIC ANEURYSM REPAIR N/A 04/04/2019   Procedure: THORACIC ASCENDING ANEURYSM REPAIR (AAA)  USING 28 MM X 30 CM HEMASHIELD PLATINUM VASCULAR GRAFT;  Surgeon: Wonda Olds, MD;  Location[facility];  Service: Open Heart Surgery;  Laterality: N/A;   Patient Active Problem List   Diagnosis Date Noted   Right hemiparesis ([place]) 04/06/2022   Acute cardioembolic stroke ([place]) 92/04/9416   Stroke (cerebrum) ([place]) 02/23/2022   Stroke ([place]) 02/23/2022   Hypervolemia associated with renal insufficiency 01/29/2022   Dyslipidemia 01/29/2022   DNR (do not resuscitate) 01/29/2022   Polysubstance abuse ([place]) 01/06/2022   Acute on chronic combined systolic and diastolic CHF (congestive heart failure)  ([place]) 01/06/2022   Abdominal pain 40/81/4481   Acute metabolic encephalopathy 85/63/1497   Malignant HTN with heart disease, w/o CHF, 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 12/24/2021   History of hepatitis C 12/08/2021   Hypertensive urgency 10/29/2021   Fluid overload 10/29/2021   Acute on chronic HFrEF (heart failure with reduced ejection fraction) ([place]) 10/29/2021   Chronic anticoagulation 10/29/2021   Prolonged QT interval 10/29/2021   Leukopenia 10/29/2021   SOB (shortness of breath) 09/15/2021   Hypertensive emergency 08/23/2021   Altered mental status    ESRD (end stage renal disease) on dialysis with hyperkalemia and metabolic acidosis with elevated AG  08/12/2021   Malignant hypertension 06/19/2021   Anemia 06/05/2021   Chronic diastolic congestive heart failure ([place]) 04/30/2021   Pure hypercholesterolemia 02/14/2021   Acute pulmonary edema ([place]) 12/23/2020   Acute respiratory failure with hypoxia ([place]) 09/21/2020   Acute renal failure superimposed on chronic kidney disease ([place]) 09/21/2020   Community acquired pneumonia 09/21/2020   Uremia 02/63/7858   Metabolic acidosis with increased anion gap and accumulation of organic acids 09/21/2020   Hypertensive crisis 09/21/2020   Troponin level elevated  08/28/2019   Positive D dimer 08/28/2019   Hyperkalemia 08/28/2019   SVT (supraventricular tachycardia) 08/28/2019   SIRS (systemic inflammatory response syndrome) ([place]) 08/27/2019   Paroxysmal SVT (supraventricular tachycardia) 08/27/2019   Aortic atherosclerosis ([place]) 07/05/2019   Chest pain 07/05/2019   History of CVA (cerebrovascular accident) 05/29/2019   AF (paroxysmal atrial fibrillation) ([place]) 05/29/2019   ESRD on dialysis ([place]) 05/29/2019   Cerebral thrombosis with cerebral infarction 05/22/2019   Malnutrition of moderate degree 05/19/2019   Pleural effusion 05/18/2019   Atrial fibrillation with RVR ([place]) 05/18/2019   S/P aortic aneurysm repair 04/07/2019   Aortic  dissection ([place]) 04/04/2019   Dissection of aorta ([place]) 04/03/2019   Non compliance with medical treatment 12/04/2017   Chronic obstructive lung disease ([place]) 01/16/2017   Chronic low back pain 06/22/2016   Insomnia 03/14/2015   S/P lumbar spinal fusion 01/18/2014   Tobacco use disorder 04/19/2009   GERD 09/16/2006    ONSET DATE:  DOI: 02/22/22 (CVA)  REFERRING DIAG: I63.9 (ICD-10-CM) - Cerebral infarction, unspecified  THERAPY DIAG:  Muscle weakness (generalized)  Localized edema  Other lack of coordination  Stiffness of right hand, not elsewhere classified  Stiffness of right wrist, not elsewhere classified  Rationale for Evaluation and Treatment: Rehabilitation  PERTINENT HISTORY: She suffered a stroke on 02/22/22 to her Lt posterior-frontal parietal and anterior-parietal areas causing dysphagia, right hemiplegia, speech issues, and cognitive problems. She spent ~2 weeks in the hospital, in inpatient rehab. At discharge from inpatient rehab, she was noted to have: no right handed grip, 75% intelligible speech, SBA with no device for transfers, dysphagia, HTN, HLD, tachycardia, A-fib, CKD, and chest pain (other than cardiac).   PRECAUTIONS: Fall and Other: Rt hemiplegia, speech, swallow and cognitive issues  WEIGHT BEARING RESTRICTIONS: No   SUBJECTIVE:   SUBJECTIVE STATEMENT: She states feeling better, and has her edema glove on today.    PAIN:  Are you having pain? No  Rating: 0/10 at rest now    PATIENT GOALS: To get right dom arm moving better.    OBJECTIVE: (All objective assessments below are from initial evaluation on: 04/01/22 unless otherwise specified.)    HAND DOMINANCE: Right   ADLs: Overall ADLs: States decreased ability to grab, hold household objects, pain and inability to open containers, perform FMS tasks (manipulate fasteners on clothing), mild to moderate bathing problems as well.    FUNCTIONAL OUTCOME MEASURES: Eval: Quck DASH 38.6%  impairment today  (Higher % Score  =  More Impairment)     UPPER EXTREMITY ROM     Shoulder to Wrist AROM Right eval Rt  04/08/22  Shoulder flexion 108* in scaption 118* in scaption  Shoulder abduction 128* 107*  Shoulder extension 68   Shoulder internal rotation    Shoulder external rotation    Elbow flexion 130 138*  Elbow extension (-10) 0*  Forearm supination 65 51*  Forearm pronation  65 60*  Wrist flexion 15   Wrist extension 4   Wrist ulnar deviation 19   Wrist radial deviation 5   (Blank rows = not tested)  Eval: She has some (~10-15*) total motion in composite finger flexion and extension today with prolonged squeezes. Thumb motion is nil.   Hand AROM Right eval  Full Fist Ability (or Gap to Distal Palmar Crease) 5.5cm gap from MF tip to [facility]  Thumb Opposition to Small Finger (or Gap) unable  Thumb Opposition to Base of Small Finger (or Gap)  unable  (Blank rows = not  tested)   UPPER EXTREMITY MMT:    Eval:  Right Arm: Grossly 3+/5 at shoulder, 3+/5 at elbow, 3-/5 at forearm, 2-/5 at wrist and 1+/5 in fingers  MMT Right TBD  Shoulder flexion   Shoulder abduction   Shoulder adduction   Shoulder extension   Shoulder internal rotation   Shoulder external rotation   Middle trapezius   Lower trapezius   Elbow flexion   Elbow extension   Forearm supination   Forearm pronation   Wrist flexion   Wrist extension   Wrist ulnar deviation   Wrist radial deviation   (Blank rows = not tested)  HAND FUNCTION: Eval: Observed weakness in affected hand.  Grip strength Right: 0 lbs, Left: 26 lbs   COORDINATION: 04/27/22: Box and Blocks Test: Right able to grasp and lift block but it drops from hand before she can move it over.   Eval: Observed coordination impairments with affected rt hand. Box and Blocks Test: Right unable to grasp a block today (TBD is [facility])  SENSATION: Eval:  Light touch intact today, equal b/l per pt  EDEMA:   04/15/22: 46.7cm today at  start (yesterday had dialysis and it obviously made it worse). (45cm after facilitated e-stim.)   04/13/22: 45cm figure of 8 around Rt hand to start    COGNITION: Eval: Overall cognitive status: overtly seemed [facility] for evaluation today, though perhaps a bit impulsive.  She is working with SLP for this and speech   OBSERVATIONS:   05/06/22: She is much more alert today, bright eyed, had dialysis yesterday. She performs much better today.   Eval: She has fairly good motion at shoulder and elbow, speech can be difficult to understand    TODAY'S TREATMENT:  05/06/22: OT edu on wrist stretches in flex, ext to help keep wrist looser, after quick review of full HEP. OT also does manual therapy thumb stretches in composite flexion to improve grasp.  She does b/l reaching over shoulder height to move large ball from counter to over shoulder 10x, then moves 10 medium sized balls from counter to container. She then removes 10 pegs from pegboard, working on pinch, grip, etc.  She continues to be more motivated with fnl tasks than AROM exercises. Lastly, she attempts coloring with built up foam markers, but she struggles to maintain grasp when attention moves from hand/marker to the paper- the marker slips. She is encouraged to concentrate on grasp, and she does a bit better this way.   Exercises - Seated Wrist Flexion Extension PROM  - 4-6 x daily - 1 sets - 10-15 reps - Seated Wrist Extension Stretch  - 2-3 x daily - 5 reps - 15 seconds hold  Due to increased recent lethargy, OT contacted PCP and referring physician after Monday's apportionment.  Referring physican advises her to f/u with endocrinologist and PCP, so OT provided the pt with the following info (for self-care, safety, etc.): "Monday Dec 11 @ 10am- Follow-up with Dr. Leonie Man  (neurologist). Be sure to tell him about increasing tiredness / sleepiness,  any headaches or other symptoms.  Your condition is quite complicated by the need for dialysis,  and that may need addressed with endocrinologist (treats your kidney disease) and primary care physician.  Your condition may get worse, and you are at risk for falling / injury, if you sleepiness (lethargy) continues / gets worse.  This may be a sign of encephalopathy. Try to be motivated everyday to get your body moving and exercising, eat healthy,  and get a goo night's rest.  No skipping dialysis treatments."    She was much better today, so hopefully this level of energy stays.    PATIENT EDUCATION: Education details: See tx section above for details  Person educated: Patient Education method: Verbal Instruction, Teach back, Handouts  Education comprehension: States and demonstrates understanding, Additional Education required    HOME EXERCISE PROGRAM: Access Code: [email or web address] URL: [email or web address] Date: 04/08/2022 Prepared by: Benito Mccreedy   GOALS: Goals reviewed with patient? Yes  SHORT TERM GOALS: Target date: 04/17/22  Pt will demo/state understanding of initial home exercise program (HEP) to improve function, pain, and independence.   Baseline: Needs a plan for rehabilitation  Goal status: MET for basic HEP now.   2.  Pt will obtain protective, custom or mobilizing orthotic for safety and to improve function.  Baseline: Needs orthotic support  Goal status: TBD (This may be necessary to protect hand, if motion does not improve.)    LONG TERM GOALS: Target date: 05/15/22  Pt will improve functional ability by decreased impairment per Quick DASH assessment to 15% or better, for better quality of life. Baseline: 38.6% Goal status: INITIAL  2.  Pt will improve A/ROM in Rt wrist flex/ext to at least 40* each, to have prerequisite/functional motion for tasks like reach and grasp.  Baseline: 15* / 4* respectively  Goal status: INITIAL  3.  Pt will improve strength in Rt elbow to at least 4+/5 MMT to have increased functional ability to carry out selfcare  and higher-level homecare tasks with no difficulty Baseline: 3+/5 MMT Goal status: INITIAL  4.  Pt will improve coordination skills in Rt hand, as seen by increasing score on box and blocks testing to at least 20 blocks to have increased functional ability to carry out fine motor tasks (fasteners, etc.) and more complex, coordinated IADLs (meal prep, sports, etc.).  Baseline: unable to grab a block Goal status: INITIAL  5.   Pt will improve grip strength in effected hand to at least 15# lbs for functional use at home and in IADLs. Baseline: 0 lbs Goal status: INITIAL   ASSESSMENT:  CLINICAL IMPRESSION: 05/06/22: Much more alert and energetic today.  Fnl activities continue to be her best activities.  Reassessment due next week.   05/04/22: Today she was more lethargic than normal, even nodding off during therapy treatments. Her lethargy and poor motivation (also complications with non-compliance with dialysis) is making therapy prognosis worse, and in some respects she presents wore now than at the start of care. If this continues, and/or she misses more dialysis, has more hospitalizations, she may not be appropriate for outpatient treatment. She was cautioned about all this and asked to get a good night's rest, go to dialysis appointments, call her doctor is symptoms worsen or go to ED again if concern (signs and symptoms reviewed) of new stroke arises.    PLAN:  OT FREQUENCY: 2x/week  OT DURATION: 6 weeks (through 05/15/22 as needed)   PLANNED INTERVENTIONS: self care/ADL training, therapeutic exercise, therapeutic activity, neuromuscular re-education, manual therapy, passive range of motion, splinting, electrical stimulation, ultrasound, compression bandaging, moist heat, cryotherapy, contrast bath, patient/family education, coping strategies training, and DME and/or AE instructions  RECOMMENDED OTHER SERVICES: she is already receiving SLP and PT, perhaps would benefit from nutrition  consult, if not already going, for CKD and stroke diet recommendations   CONSULTED AND AGREED WITH PLAN OF CARE: Patient and family member/caregiver  PLAN FOR NEXT SESSION:  Reassess next  Wed, check goals an discuss fnl difficulties.  Continue therapy as needed/appropriate.    Benito Mccreedy, OTR/L, CHT 05/06/2022, 5:27 PM

## 2022-05-06 ENCOUNTER — Encounter: Payer: Self-pay | Admitting: Rehabilitative and Restorative Service Providers"

## 2022-05-06 ENCOUNTER — Encounter: Payer: Self-pay | Admitting: Physician Assistant

## 2022-05-06 ENCOUNTER — Ambulatory Visit: Payer: Medicare Other | Attending: Physician Assistant | Admitting: Physician Assistant

## 2022-05-06 ENCOUNTER — Telehealth: Payer: Self-pay

## 2022-05-06 ENCOUNTER — Ambulatory Visit: Payer: Medicare Other | Admitting: Rehabilitative and Restorative Service Providers"

## 2022-05-06 VITALS — BP 130/72 | HR 74 | Ht 63.0 in | Wt 105.0 lb

## 2022-05-06 DIAGNOSIS — N186 End stage renal disease: Secondary | ICD-10-CM

## 2022-05-06 DIAGNOSIS — Z8679 Personal history of other diseases of the circulatory system: Secondary | ICD-10-CM

## 2022-05-06 DIAGNOSIS — R600 Localized edema: Secondary | ICD-10-CM

## 2022-05-06 DIAGNOSIS — I10 Essential (primary) hypertension: Secondary | ICD-10-CM

## 2022-05-06 DIAGNOSIS — R7989 Other specified abnormal findings of blood chemistry: Secondary | ICD-10-CM

## 2022-05-06 DIAGNOSIS — M6281 Muscle weakness (generalized): Secondary | ICD-10-CM | POA: Diagnosis not present

## 2022-05-06 DIAGNOSIS — I6523 Occlusion and stenosis of bilateral carotid arteries: Secondary | ICD-10-CM

## 2022-05-06 DIAGNOSIS — M25641 Stiffness of right hand, not elsewhere classified: Secondary | ICD-10-CM

## 2022-05-06 DIAGNOSIS — I480 Paroxysmal atrial fibrillation: Secondary | ICD-10-CM

## 2022-05-06 DIAGNOSIS — D649 Anemia, unspecified: Secondary | ICD-10-CM

## 2022-05-06 DIAGNOSIS — I5022 Chronic systolic (congestive) heart failure: Secondary | ICD-10-CM | POA: Diagnosis not present

## 2022-05-06 DIAGNOSIS — R278 Other lack of coordination: Secondary | ICD-10-CM

## 2022-05-06 DIAGNOSIS — Z992 Dependence on renal dialysis: Secondary | ICD-10-CM

## 2022-05-06 DIAGNOSIS — M25631 Stiffness of right wrist, not elsewhere classified: Secondary | ICD-10-CM

## 2022-05-06 NOTE — Patient Instructions (Addendum)
Medication Instructions:   Your physician recommends that you continue on your current medications as directed. Please refer to the Current Medication list given to you today.   *If you need a refill on your cardiac medications before your next appointment, please call your pharmacy*   Lab Work:  TODAY :  CBC  TSH   If you have labs (blood work) drawn today and your tests are completely normal, you will receive your results only by: [place] (if you have MyChart) OR A paper copy in the mail If you have any lab test that is abnormal or we need to change your treatment, we will call you to review the results.   Testing/Procedures: IN LATE JANUARY  BEFORE FOLLOW UP APPOINTMENT Your physician has requested that you have an echocardiogram. Echocardiography is a painless test that uses sound waves to create images of your heart. It provides your doctor with information about the size and shape of your heart and how well your heart's chambers and valves are working. This procedure takes approximately one hour. There are no restrictions for this procedure. Please do NOT wear cologne, perfume, aftershave, or lotions (deodorant is allowed). Please arrive 15 minutes prior to your appointment time.    Follow-Up: At [facility], you and your health needs are our priority.  As part of our continuing mission to provide you with exceptional heart care, we have created designated Provider Care Teams.  These Care Teams include your primary Cardiologist (physician) and Advanced Practice Providers (APPs -  Physician Assistants and Nurse Practitioners) who all work together to provide you with the care you need, when you need it.  We recommend signing up for the patient portal called "MyChart".  Sign up information is provided on this After Visit Summary.  MyChart is used to connect with patients for Virtual Visits (Telemedicine).  Patients are able to view lab/test results, encounter notes,  upcoming appointments, etc.  Non-urgent messages can be sent to your provider as well.   To learn more about what you can do with MyChart, go to [email or web address].    Your next appointment:   AS SCHEDULED    The format for your next appointment:   In Person   Provider: DR Irish Lack      Other Instructions:  Important Information About [place]

## 2022-05-06 NOTE — Telephone Encounter (Signed)
Transition Care Management Follow-up Telephone Call Date of discharge and from where: 05/01/2022, [facility] How have you been since you were released from the hospital? She stated she is doing fine. She was in the car with her sister, Blake Divine, at the time of the call. They were on the way to her cardiology appointment.  Any questions or concerns? No  Items Reviewed: Did the pt receive and understand the discharge instructions provided? Yes  Medications obtained and verified? Yes - she said she has all of her medications and did not have any questions about the med regime.  Her family helps her manage the meds.  Other? No  Any new allergies since your discharge? No  Dietary orders reviewed? No Do you have support at home? Yes   Home Care and Equipment/Supplies: Were home health services ordered? no If so, what is the name of the agency? N/a  Has the agency set up a time to come to the patient's home? not applicable Were any new equipment or medical supplies ordered?  No What is the name of the medical supply agency? N/a Were you able to get the supplies/equipment? not applicable Do you have any questions related to the use of the equipment or supplies? No  Functional Questionnaire: (I = Independent and D = Dependent) ADLs: ambulates without an assistive device.  Boyfriend and family provide assistance with ADLs as needed  She attends HD: T/T/S.  She also attends outpatient OT and ST.   Follow up appointments reviewed:  PCP Hospital f/u appt confirmed? Yes  Scheduled to see Dr Redmond Pulling - 05/22/2022  Specialist Hospital f/u appt confirmed? Yes  Scheduled to see cardiology- today, diabetic educator- 05/08/2022, neurology- 05/11/2022, Are transportation arrangements needed? No  If their condition worsens, is the pt aware to call PCP or go to the Emergency Dept.? Yes Was the patient provided with contact information for the PCP's office or ED? Yes Was to pt encouraged to call  back with questions or concerns? Yes

## 2022-05-07 LAB — TSH: TSH: 0.596 u[IU]/mL (ref 0.450–4.500)

## 2022-05-07 LAB — CBC
Hematocrit: 23.9 % — ABNORMAL LOW (ref 34.0–46.6)
Hemoglobin: 7.8 g/dL — ABNORMAL LOW (ref 11.1–15.9)
MCH: 30 pg (ref 26.6–33.0)
MCHC: 32.6 g/dL (ref 31.5–35.7)
MCV: 92 fL (ref 79–97)
Platelets: 352 10*3/uL (ref 150–450)
RBC: 2.6 x10E6/uL — CL (ref 3.77–5.28)
RDW: 14.3 % (ref 11.7–15.4)
WBC: 4.5 10*3/uL (ref 3.4–10.8)

## 2022-05-08 ENCOUNTER — Ambulatory Visit: Payer: Medicare Other | Admitting: Dietician

## 2022-05-08 NOTE — Therapy (Unsigned)
OUTPATIENT SPEECH LANGUAGE PATHOLOGY EVALUATION   Patient Name: Monique Henderson MRN: [id number] DOB:August 09, 1950, 71 y.o., female Today's Date: 05/08/2022  PCP: Dr. Dorna Mai REFERRING PROVIDER: PA-C Reesa Chew        Past Medical History:  Diagnosis Date   AF (paroxysmal atrial fibrillation) ([place]) 05/29/2019   on Coumadin   Aortic atherosclerosis ([place]) 07/05/2019   Aortic dissection ([place]) 04/04/2019   s/p repair   Bone spur 2008   Right calcaneal foot spur   Breast cancer ([place]) 2004   Ductal carcinoma in situ of the left breast; S/P left partial mastectomy 02/26/2003; S/P re-excision of cranial and lateral margins11/18/2004.radiation   Carotid artery disease (HCC)    Cerebral thrombosis with cerebral infarction 05/22/2019   Chronic HFrEF (heart failure with reduced ejection fraction) (HCC)    Chronic low back pain 06/22/2016   Chronic obstructive lung disease ([place]) 01/16/2017   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 Autumn Messing; S/P re-excision of superior and inferior margins 10/27/2012.    ESRD on hemodialysis ([place]) 05/29/2019   Essential hypertension 09/16/2006   GERD 09/16/2006   Hepatitis C    treated and RNA confirmed not detectable 01/2017   Insomnia 03/14/2015   Malnutrition of moderate degree 05/19/2019   Non compliance with medical treatment 12/04/2017   Normocytic anemia    With thrombocytosis   Osteoarthritis    Right ureteral stone 2002   S/P lumbar spinal fusion 01/18/2014   S/P lumbar decompressive laminectomy, fusion, and plating for lumbar spinal stenosis on 05/27/2009 by Dr. Eustace Moore.  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 Eustace Moore.     Stroke (cerebrum) ([place])    Tobacco use  disorder 04/19/2009   Uterine fibroid    Wears dentures    top   Past Surgical History:  Procedure Laterality Date   ANTERIOR LAT LUMBAR FUSION N/A 01/18/2014   Procedure: ANTERIOR LATERAL LUMBAR FUSION LUMBAR TWO-THREE;  Surgeon: Eustace Moore, MD;  Location: [facility];  Service: Neurosurgery;  Laterality: N/A;   ANTERIOR LUMBAR FUSION  01/18/2014   AV FISTULA PLACEMENT Left 04/20/2019   Procedure: ARTERIOVENOUS (AV) FISTULA CREATION;  Surgeon: [name], MD;  Location: [facility];  Service: Vascular;  Laterality: Left;   BACK SURGERY     BREAST LUMPECTOMY Left 01/2003   BREAST LUMPECTOMY Right 2014   BREAST LUMPECTOMY WITH NEEDLE LOCALIZATION AND AXILLARY SENTINEL LYMPH NODE BX Right 10/13/2012   Procedure: BREAST LUMPECTOMY WITH NEEDLE LOCALIZATION;  Surgeon: [name], MD;  Location: [facility];  Service: General;  Laterality: Right;  Right breast wire localized lumpectomy   INSERTION OF DIALYSIS CATHETER Right 04/20/2019   Procedure: INSERTION OF DIALYSIS CATHETER, right internal jugular;  Surgeon: [name], MD;  Location: [facility];  Service: Vascular;  Laterality: Right;   INSERTION OF DIALYSIS CATHETER Right 09/24/2020   Procedure: INSERTION OF TUNNELED DIALYSIS CATHETER;  Surgeon: Rosetta Posner, MD;  Location: [facility];  Service: Vascular;  Laterality: Right;   IR FLUORO GUIDE CV LINE RIGHT  09/22/2020   IR THORACENTESIS ASP PLEURAL SPACE W/IMG GUIDE  05/19/2019   IR US GUIDE VASC ACCESS LEFT  09/22/2020   IR US GUIDE VASC ACCESS RIGHT  09/22/2020   IR  VENOCAVAGRAM SVC  09/22/2020   LAMINECTOMY  05/27/2009   Lumbar decompressive laminectomy, fusion and plating for lumbar spinal stensosis   LIGATION OF ARTERIOVENOUS  FISTULA Left 09/24/2020   Procedure: LIGATION OF LEFT ARM ARTERIOVENOUS  FISTULA;  Surgeon: Rosetta Posner, MD;  Location: [facility];  Service: Vascular;  Laterality: Left;   LUMBAR LAMINECTOMY/DECOMPRESSION MICRODISCECTOMY Left  03/23/2013   Procedure: LUMBAR LAMINECTOMY/DECOMPRESSION MICRODISCECTOMY 1 LEVEL;  Surgeon: Eustace Moore, MD;  Location: [facility];  Service: Neurosurgery;  Laterality: Left;  LUMBAR LAMINECTOMY/DECOMPRESSION MICRODISCECTOMY 1 LEVEL   MASTECTOMY, PARTIAL Left 02/26/2003   ; S/P re-excision of cranial and lateral margins 04/19/2003.    RE-EXCISION OF BREAST CANCER,SUPERIOR MARGINS Right 10/27/2012   Procedure: RE-EXCISION OF BREAST CANCER,SUPERIOR and inferior MARGINS;  Surgeon: [name], MD;  Location: [facility];  Service: General;  Laterality: Right;   RE-EXCISION OF BREAST LUMPECTOMY Left 04/2003   TEE WITHOUT CARDIOVERSION N/A 04/04/2019   Procedure: Transesophageal Echocardiogram ([place]);  Surgeon: Wonda Olds, MD;  Location: [facility];  Service: Open Heart Surgery;  Laterality: N/A;   THORACIC AORTIC ANEURYSM REPAIR N/A 04/04/2019   Procedure: THORACIC ASCENDING ANEURYSM REPAIR (AAA)  USING 28 MM X 30 CM HEMASHIELD PLATINUM VASCULAR GRAFT;  Surgeon: Wonda Olds, MD;  [facility];  Service: Open Heart Surgery;  Laterality: N/A;   Patient Active Problem List   Diagnosis Date Noted   Right hemiparesis ([place]) 04/06/2022   Acute cardioembolic stroke ([place]) 81/82/9937   Stroke (cerebrum) ([place]) 02/23/2022   Stroke ([place]) 02/23/2022   Hypervolemia associated with renal insufficiency 01/29/2022   Dyslipidemia 01/29/2022   DNR (do not resuscitate) 01/29/2022   Polysubstance abuse ([place]) 01/06/2022   Acute on chronic combined systolic and diastolic CHF (congestive heart failure) ([place]) 01/06/2022   Abdominal pain 16/96/7893   Acute metabolic encephalopathy 81/06/7508   Malignant HTN with heart disease, w/o CHF, 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 12/24/2021   History of hepatitis C 12/08/2021   Hypertensive urgency 10/29/2021   Fluid overload 10/29/2021   Acute on chronic HFrEF (heart failure with reduced ejection fraction) ([place]) 10/29/2021   Chronic anticoagulation 10/29/2021   Prolonged QT  interval 10/29/2021   Leukopenia 10/29/2021   SOB (shortness of breath) 09/15/2021   Hypertensive emergency 08/23/2021   Altered mental status    ESRD (end stage renal disease) on dialysis with hyperkalemia and metabolic acidosis with elevated AG  08/12/2021   Malignant hypertension 06/19/2021   Anemia 06/05/2021   Chronic diastolic congestive heart failure ([place]) 04/30/2021   Pure hypercholesterolemia 02/14/2021   Acute pulmonary edema ([place]) 12/23/2020   Acute respiratory failure with hypoxia ([place]) 09/21/2020   Acute renal failure superimposed on chronic kidney disease ([place]) 09/21/2020   Community acquired pneumonia 09/21/2020   Uremia 25/85/2778   Metabolic acidosis with increased anion gap and accumulation of organic acids 09/21/2020   Hypertensive crisis 09/21/2020   Troponin level elevated 08/28/2019   Positive D dimer 08/28/2019   Hyperkalemia 08/28/2019   SVT (supraventricular tachycardia) 08/28/2019   SIRS (systemic inflammatory response syndrome) ([place]) 08/27/2019   Paroxysmal SVT (supraventricular tachycardia) 08/27/2019   Aortic atherosclerosis ([place]) 07/05/2019   Chest pain 07/05/2019   History of CVA (cerebrovascular accident) 05/29/2019   AF (paroxysmal atrial fibrillation) ([place]) 05/29/2019   ESRD on dialysis ([place]) 05/29/2019   Cerebral thrombosis with cerebral infarction 05/22/2019   Malnutrition of moderate degree 05/19/2019   Pleural effusion 05/18/2019   Atrial fibrillation with RVR ([place]) 05/18/2019   S/P  aortic aneurysm repair 04/07/2019   Aortic dissection ([place]) 04/04/2019   Dissection of aorta ([place]) 04/03/2019   Non compliance with medical treatment 12/04/2017   Chronic obstructive lung disease ([place]) 01/16/2017   Chronic low back pain 06/22/2016   Insomnia 03/14/2015   S/P lumbar spinal fusion 01/18/2014   Tobacco use disorder 04/19/2009   GERD 09/16/2006    ONSET DATE: 02/23/2022   REFERRING DIAG: I63.9 (ICD-10-CM) - Cerebral infarction, unspecified    THERAPY DIAG:  No diagnosis found.  Rationale for Evaluation and Treatment: Rehabilitation  SUBJECTIVE:   SUBJECTIVE STATEMENT: ***  PAIN:  Are you having pain? denies   OBJECTIVE:   TODAY'S TREATMENT: 05-11-22: ***  05-04-22: SLP educated on diet recommendations since pt was placed on alternative diet while recently admitted to hospital. Education provided on swallowing strategies and compensations to aid in continued ability to enjoy preferred foods at home. Handout provided, pt verbalizes understanding, denies questions regarding recommendations. Target employment of dysarthria strategies- syllabification, increased intensity, reduced rate- at generative sentence level with 3 syllable words containing consonant blends. At single word, reading, level 90% accuracy initial attempt. 70% accuracy at generative sentence level initial attempt with occasional min-A. Intermittent ability to self-monitor for accuracy of production with pt able to self correct x4. Pt read 2 paragraph reading with many /sk/ blends, demonstrating carryover of reduced rate and over articulation with mod-I. Rare self-correction evidences pt with awareness of production during this exercise. Averages 71 dB during paragraph reading.   04-27-22: Pt reports to improved communication across variety of communication partners. In opening conversation of 10 minutes, pt evidences reduced rate and syllabification with rare need for request for repetition. SLP provided education and modeling of dysarthria strategies of slowed rate and over articulation. Following, pt provided with oral reading passage. Achieved 70% intelligibility initial attempt. With re-education and coaching on tricky words, pt able to achieve 90% accuracy on second attempt of same stimuli Novel stimuli, pt employs strategies and achieves 88% intelligibility on initial attempt. Education on topic maintenance or context to aid in communication efficacy.    04-20-22: SLP provided additional education on dysarthria strategies to aid in overall intelligibility with focus this date on slowed rate, overarticulation, and increased vocal intensity. Given tri-syllabic words containing /s/ in all positions, pt able to produce at word level 85% accuracy on intial attempt; produce in generative sentence 75% accuracy. SLP provides occasional mod-A for use of strategies with occasional requirement for direct model. Pt demonstrates spontaneous self correction x4 over course of structured practice. At conclusion of session, pt ID "break up my words" as strategy which A in improved intelligibility.    04-13-22: SLP provided education on evaluation observations and recommendations for therapy course. Initiated training re: dysarthria compensations and strategies to aid in improved communication efficacy. Collaborated with pt to establish POC and goals. Pt in agreement with POC and denies questions at conclusion of evaluation.    PATIENT EDUCATION: Education details: see above Person educated: Patient and sister Education method: Customer service manager Education comprehension: verbalized understanding, returned demonstration, and needs further education   GOALS: Goals reviewed with patient? Yes  SHORT TERM GOALS: Target date: 05/05/2022  Pt will intelligibly produce multi-syllabic words containing blends at generative sentence level in 18/20 trials with rare min-A Baseline: 05/04/22 Goal status: PARTIALLY MET  2.  Pt will teach back strategies and compensations to aid in communication efficacy with various communication partners  Baseline: 04/27/22 Goal status: MET  3.  Pt will maintain 68+  dB average during 5 minute conversation with occasional mod-A over 2 sessions Baseline: 05/04/22 Goal status: MET  4.  Pt will read 1 paragraph with use of compensations PRN, demonstrating carryover of dysarthria strategies with rare min-A Baseline:  05/04/22 Goal status: MET   LONG TERM GOALS: Target date: 07/07/2022  Pt will report BID dysarthria home practice 5/7 days over 1 week period with mod-I Baseline:  Goal status: INITIAL  2.  Pt will maintain 68+ dB average over 20 minute conversation with occasional min-A Baseline:  Goal status: INITIAL  3.  Pt will complete mult-paragraph oral reading, demonstrating carryover of dysarthria strategies with rare min-A Baseline:  Goal status: INITIAL  4.  Pt will report improve subjective perception of speech efficacy via patient reported outcome measure by d/c  Baseline: 21 Goal status: INITIAL   ASSESSMENT:  CLINICAL IMPRESSION: Patient is a 71 y.o. 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 Session focused on employment of dysarthria compensations in progressively difficult speech tasks to aid in generalization to conversational speech. Increased accuracy at sentence generation level of 3 syllable words with blends. Pt evidencing emerging skill in self-monitoring and correction. Continue per POC.   OBJECTIVE IMPAIRMENTS: include dysarthria. These impairments are limiting patient from effectively communicating at home and in community. Factors affecting potential to achieve goals and functional outcome are severity of impairments. Patient will benefit from skilled SLP services to address above impairments and improve overall function.  REHAB POTENTIAL: Good  PLAN:  SLP FREQUENCY: 1x/week  SLP DURATION: 12 weeks  PLANNED INTERVENTIONS: Language facilitation, Cueing hierachy, Internal/external aids, Functional tasks, Multimodal communication approach, SLP instruction and feedback, Compensatory strategies, and Patient/family education    Su Monks, CCC-SLP 05/08/2022, 10:24 AM

## 2022-05-11 ENCOUNTER — Telehealth: Payer: Self-pay | Admitting: Interventional Cardiology

## 2022-05-11 ENCOUNTER — Ambulatory Visit: Payer: Medicare Other | Admitting: Rehabilitative and Restorative Service Providers"

## 2022-05-11 ENCOUNTER — Ambulatory Visit (INDEPENDENT_AMBULATORY_CARE_PROVIDER_SITE_OTHER): Payer: Medicare Other | Admitting: Neurology

## 2022-05-11 ENCOUNTER — Ambulatory Visit: Payer: Medicare Other | Admitting: Speech Pathology

## 2022-05-11 ENCOUNTER — Encounter: Payer: Self-pay | Admitting: Neurology

## 2022-05-11 VITALS — BP 186/95 | HR 85 | Ht 63.0 in | Wt 106.0 lb

## 2022-05-11 DIAGNOSIS — I63412 Cerebral infarction due to embolism of left middle cerebral artery: Secondary | ICD-10-CM

## 2022-05-11 DIAGNOSIS — I482 Chronic atrial fibrillation, unspecified: Secondary | ICD-10-CM | POA: Diagnosis not present

## 2022-05-11 DIAGNOSIS — R41841 Cognitive communication deficit: Secondary | ICD-10-CM

## 2022-05-11 DIAGNOSIS — R471 Dysarthria and anarthria: Secondary | ICD-10-CM | POA: Diagnosis not present

## 2022-05-11 NOTE — Therapy (Signed)
[facility] [address] [place], [place], [address] Phone: [phone]   Fax:  [phone]  Patient Details  Name: Monique Henderson MRN: [id number] Date of Birth: 11-10-1950 Referring Provider:  Dorna Mai, MD  Encounter Date: 05/11/2022  Pt arrives for session, reports to feeling "unwell." Presenting with dry cough, reports was coughing all night last night. SLP took BP, 113/53. Provided small cup of water (pt with fluid restriction) and led pt through seated marching. Subsequent BP reading 119/56. Pt declines snack, tells SLP, "I just want to go home." End session, will resume per POC next scheduled visit.   Su Monks, [place] 05/11/2022, 12:55 PM  [facility] [address] [place], [place], [address] Phone: [phone]   Fax:  [phone]

## 2022-05-11 NOTE — Progress Notes (Signed)
[facility] [address] [place] [place] [address] [phone]       OFFICE FOLLOW-UP NOTE  Monique Henderson Date of Birth:  [birth year] Medical Record Number:  [id number]   HPI: Monique Henderson is a 71 year old African-American lady seen today for initial office follow-up visit following hospital consultation for stroke in September 2023.  She is accompanied by her sister.  History is obtained from them and review of electronic medical records and I personally reviewed pertinent available imaging films in PACS. Monique Henderson is a 71 y.o. 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  Initially, last known well was unclear as patient was adamant that she was well before bed last night at 21:00 and when she woke up at 07:00, her deficits were present.  Patient's sister via telephone noted that the patient has a friend that stays with her that reported her symptoms were present upon waking from sleep on 02/22/22 with a last known well of 9/23.  Patient refused to go to the hospital and be evaluated yesterday but since the symptoms persisted today she decided to be evaluated.  On further evaluation, patient did admit that her symptoms were present yesterday on 9/24.  Her last dose of Eliquis was 9/24.  Some history limited from patient due to severe dysarthria.  While in triage, patient's blood glucose was obtained at 106 and her blood pressure was elevated to 240/150.  She was not TNK candidate as she  presented outside the window.  He is not an LVO candidate instrumentation and not consistent with LVO.  CT head showed left frontal hypoattenuation possibly representing acute infarct and MRI showed acute infarct in the left posterior frontal, parietal lobe small acute infarct in left caudate head, left caudate tail of the left occipital lobe and left frontal lobe.  MRA of the head and neck was motion limited but showed no LVO or significant disease.  2D echo showed ejection fraction of 30 to 35% with global hypokinesia severe dilatation of left atrium.  LDL cholesterol 46 mg percent and hemoglobin A1c was 4.7.  Patient was started on Eliquis and counseled to be compliant taking it.  He was discharged home and is currently doing outpatient therapy treatment therapy.  Still has dysarthria and some expressive language difficulties but can be understood if she speaks slowly and loudly.  She still has significant weakness in right hand grip and intrinsic hand muscles..  She is able to walk without a cane or walker but does drag her right foot.  She has had no falls or injuries.  She is not compliant with taking Eliquis 2.5 mg twice daily not had any bleeding or bruising.  She was rehospitalized on 04/29/2022 for acute hypoxic respiratory failure due to pulmonary edema due to missing her dialysis.  She has no new complaints today  ROS:   14 system review of systems is positive for weakness, difficulty walking, speech difficulty all other systems negative  PMH:  Past Medical History:  Diagnosis Date   AF (paroxysmal atrial fibrillation) ([facility]) 05/29/2019   on Coumadin  Aortic atherosclerosis ([place]) 07/05/2019   Aortic dissection ([place]) 04/04/2019   s/p repair   Bone spur 2008   Right calcaneal foot spur   Breast cancer ([facility]) 2004   Ductal carcinoma in situ of the left breast; S/P left partial mastectomy 02/26/2003; S/P re-excision of cranial and lateral margins11/18/2004.radiation   Carotid artery disease  ([facility])    Cerebral thrombosis with cerebral infarction 05/22/2019   Chronic HFrEF (heart failure with reduced ejection fraction) ([facility])    Chronic low back pain 06/22/2016   Chronic obstructive lung disease ([facility]) 01/16/2017   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 Autumn Messing; S/P re-excision of superior and inferior margins 10/27/2012.    ESRD on hemodialysis ([facility]) 05/29/2019   Essential hypertension 09/16/2006   GERD 09/16/2006   Hepatitis C    treated and RNA confirmed not detectable 01/2017   Insomnia 03/14/2015   Malnutrition of moderate degree 05/19/2019   Non compliance with medical treatment 12/04/2017   Normocytic anemia    With thrombocytosis   Osteoarthritis    Right ureteral stone 2002   S/P lumbar spinal fusion 01/18/2014   S/P lumbar decompressive laminectomy, fusion, and plating for lumbar spinal stenosis on 05/27/2009 by Dr. Eustace Moore.  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 Eustace Moore.     Stroke (cerebrum) ([place])    Tobacco use disorder 04/19/2009   Uterine fibroid    Wears dentures    top    Social History:  Social History   Socioeconomic History   Marital status: Single    Spouse name: Not on file   Number of children: 2   Years of education: Not on file   Highest education level: Not on file  Occupational History   Occupation: disabled  Tobacco Use   Smoking status: Former    Packs/day: 0.25    Years: 44.00    Total pack years: 11.00    Types: Cigarettes    Quit date: 2022    Years since quitting: 1.9   Smokeless tobacco: Never   Tobacco comments:    smoking less  Vaping Use   Vaping Use: Never used  Substance and Sexual Activity   Alcohol use: Not Currently    Alcohol/week: 2.0  standard drinks of alcohol    Types: 2 Cans of beer per week    Comment: "couple beers a weekend", not currently   Drug use: Not Currently    Types: Cocaine    Comment: last in 2005   Sexual activity: Yes    Birth control/protection: Post-menopausal  Other Topics Concern   Not on file  Social History Narrative   Not on file   Social Determinants of Health   Financial Resource Strain: Not on file  Food Insecurity: No Food Insecurity (04/30/2022)   Hunger Vital Sign    Worried About Running Out of Food in the Last Year: Never true    Ran Out of Food in the Last Year: Never true  Transportation Needs: No Transportation Needs (04/30/2022)   PRAPARE - Hydrologist (Medical): No    Lack of Transportation (Non-Medical): No  Physical Activity: Not on file  Stress: Not on file  Social Connections: Not on file  Intimate Partner Violence: Not At Risk (  04/30/2022)   Humiliation, Afraid, Rape, and Kick questionnaire    Fear of Current or Ex-Partner: No    Emotionally Abused: No    Physically Abused: No    Sexually Abused: No    Medications:   Current Outpatient Medications on File Prior to Visit  Medication Sig Dispense Refill   albuterol (VENTOLIN HFA) 108 (90 Base) MCG/ACT inhaler Inhale 2 puffs into the lungs every 6 (six) hours as needed for wheezing or shortness of breath. 8.5 g 0   amLODipine (NORVASC) 10 MG tablet Take 1 tablet (10 mg total) by mouth daily. 30 tablet 0   apixaban (ELIQUIS) 2.5 MG TABS tablet Take 1 tablet (2.5 mg total) by mouth 2 (two) times daily. 60 tablet 0   carvedilol (COREG) 25 MG tablet Take 1 tablet (25 mg total) by mouth 2 (two) times daily with a meal. 60 tablet 1   Cholecalciferol (VITAMIN D-3 PO) Take 1 capsule by mouth daily.     cloNIDine (CATAPRES - DOSED IN MG/24 HR) 0.1 mg/24hr patch Place 1 patch (0.1 mg total) onto the skin every Saturday. 4 patch 0   diphenhydrAMINE-zinc acetate (BENADRYL) cream Apply topically 3  (three) times daily as needed for itching. 28.4 g 0   hydrALAZINE (APRESOLINE) 25 MG tablet Take 2 tablets (50 mg total) by mouth 2 (two) times daily. 120 tablet 1   isosorbide mononitrate (IMDUR) 30 MG 24 hr tablet Take 1 tablet (30 mg total) by mouth daily. 90 tablet 1   lidocaine (LIDODERM) 5 % Place 1 patch onto the skin daily. Remove & Discard patch within 12 hours or as directed by MD 30 patch 0   naloxone (NARCAN) nasal spray 4 mg/0.1 mL 1 spray as directed.     oxyCODONE-acetaminophen (PERCOCET) 10-325 MG tablet Take 1 tablet by mouth 3 (three) times daily as needed.     pantoprazole (PROTONIX) 40 MG tablet Take 1 tablet (40 mg total) by mouth 2 (two) times daily. 60 tablet 1   rosuvastatin (CRESTOR) 10 MG tablet Take 1 tablet (10 mg total) by mouth daily. 90 tablet 0   sevelamer carbonate (RENVELA) 2.4 g PACK Take 2.4 g by mouth 3 (three) times daily with meals. 90 each 0   traZODone (DESYREL) 50 MG tablet Take 0.5 tablets (25 mg total) by mouth at bedtime as needed for sleep. 45 tablet 0   No current facility-administered medications on file prior to visit.    Allergies:   Allergies  Allergen Reactions   Shrimp [Shellfish Allergy] Shortness Of Breath   Bactroban [Mupirocin] Other (See Comments)    "Sores in nose"   Hydrocodone Itching, Nausea Only and Nausea And Vomiting   Chlorhexidine Gluconate Itching   Tylenol [Acetaminophen] Itching   Zestril [Lisinopril] Cough    Physical Exam General: Frail malnourished looking middle-aged African-American lady, seated, in no evident distress Head: head normocephalic and atraumatic.  Neck: supple with no carotid or supraclavicular bruits Cardiovascular: regular rate and rhythm, no murmurs Musculoskeletal: no deformity Skin:  no rash/petichiae Vascular:  Normal pulses all extremities Vitals:   05/11/22 0957  BP: (!) 186/95  Pulse: 85   Neurologic Exam Mental Status: Awake and fully alert. Oriented to place and time. Recent and  remote memory intact. Attention span, concentration and fund of knowledge appropriate. Mood and affect appropriate.  Speech is dysarthric with mild word finding difficulties and nonfluent speech Cranial Nerves: Fundoscopic exam reveals sharp disc margins. Pupils equal, briskly reactive to light. Extraocular movements full without  nystagmus. Visual fields full to confrontation. Hearing intact. Facial sensation intact. Face, tongue, palate moves normally and symmetrically.  Motor: Mild right hemiparesis with significant weakness of right grip and intrinsic hand muscles and wrist extension.  Mild weakness of right triceps.  4+/5 right lower extremity strength with weakness of ankle dorsiflexors and hip flexors.  Mild increased tone on the right side. Sensory.: intact to touch ,pinprick .position and vibratory sensation.  Coordination: Rapid alternating movements normal in all extremities. Finger-to-nose and heel-to-shin performed accurately bilaterally. Gait and Station: Arises from chair without difficulty. Stance is normal. Gait demonstrates slight dragging of the right foot increased tone.. Able to heel, toe and tandem walk without difficulty.  Reflexes: 2+ and asymmetric and brisker on the right. Toes downgoing.   NIHSS  3 Modified Rankin  3   ASSESSMENT: 71 year old African-American lady with multiple left hemispheric embolic infarcts in September 2023 secondary to atrial fibrillation with questionable compliance with Eliquis.  He is doing well with residual mild dysarthria and     PLAN: I had a long d/w patient and her sister about her recent embolic stroke, atrial fibrillation and need for compliance with her anticoagulation regimen,risk for recurrent stroke/TIAs, personally independently reviewed imaging studies and stroke evaluation results and answered questions.Continue Eliquis (apixaban) 2.5 mg twice daily  for secondary stroke prevention and maintain strict control of hypertension with  blood pressure goal below 130/90, diabetes with hemoglobin A1c goal below 6.5% and lipids with LDL cholesterol goal below 70 mg/dL. I also advised the patient to eat a healthy diet with plenty of whole grains, cereals, fruits and vegetables, exercise regularly and maintain ideal body weight .continue ongoing speech and occupational therapy and may benefit from physical therapy as well.  Followup in the future with my nurse practitioner e in 6 months or call earlier if necessary. Greater than 50% of time during this 35 minute visit was spent on counseling,explanation of diagnosis, planning of further management, discussion with patient and family and coordination of care Monique Contras, MD Note: This document was prepared with digital dictation and possible smart phrase technology. Any transcriptional errors that result from this process are unintentional

## 2022-05-11 NOTE — Patient Instructions (Signed)
I had a long d/w patient and her sister about her recent embolic stroke, atrial fibrillation and need for compliance with her anticoagulation regimen,risk for recurrent stroke/TIAs, personally independently reviewed imaging studies and stroke evaluation results and answered questions.Continue Eliquis (apixaban) 2.5 mg twice daily  for secondary stroke prevention and maintain strict control of hypertension with blood pressure goal below 130/90, diabetes with hemoglobin A1c goal below 6.5% and lipids with LDL cholesterol goal below 70 mg/dL. I also advised the patient to eat a healthy diet with plenty of whole grains, cereals, fruits and vegetables, exercise regularly and maintain ideal body weight .continue ongoing speech and occupational therapy and may benefit from physical therapy as well.  Followup in the future with my nurse practitioner e in 6 months or call earlier if necessary.  Stroke Prevention Some medical conditions and behaviors can lead to a higher chance of having a stroke. You can help prevent a stroke by eating healthy, exercising, not smoking, and managing any medical conditions you have. Stroke is a leading cause of functional impairment. Primary prevention is particularly important because a majority of strokes are first-time events. Stroke changes the lives of not only those who experience a stroke but also their family and other caregivers. How can this condition affect me? A stroke is a medical emergency and should be treated right away. A stroke can lead to brain damage and can sometimes be life-threatening. If a person gets medical treatment right away, there is a better chance of surviving and recovering from a stroke. What can increase my risk? The following medical conditions may increase your risk of a stroke: Cardiovascular disease. High blood pressure (hypertension). Diabetes. High cholesterol. Sickle cell disease. Blood clotting disorders (hypercoagulable  state). Obesity. Sleep disorders (obstructive sleep apnea). Other risk factors include: Being older than age 38. Having a history of blood clots, stroke, or mini-stroke (transient ischemic attack, TIA). Genetic factors, such as race, ethnicity, or a family history of stroke. Smoking cigarettes or using other tobacco products. Taking birth control pills, especially if you also use tobacco. Heavy use of alcohol or drugs, especially cocaine and methamphetamine. Physical inactivity. What actions can I take to prevent this? Manage your health conditions High cholesterol levels. Eating a healthy diet is important for preventing high cholesterol. If cholesterol cannot be managed through diet alone, you may need to take medicines. Take any prescribed medicines to control your cholesterol as told by your health care provider. Hypertension. To reduce your risk of stroke, try to keep your blood pressure below 130/80. Eating a healthy diet and exercising regularly are important for controlling blood pressure. If these steps are not enough to manage your blood pressure, you may need to take medicines. Take any prescribed medicines to control hypertension as told by your health care provider. Ask your health care provider if you should monitor your blood pressure at home. Have your blood pressure checked every year, even if your blood pressure is normal. Blood pressure increases with age and some medical conditions. Diabetes. Eating a healthy diet and exercising regularly are important parts of managing your blood sugar (glucose). If your blood sugar cannot be managed through diet and exercise, you may need to take medicines. Take any prescribed medicines to control your diabetes as told by your health care provider. Get evaluated for obstructive sleep apnea. Talk to your health care provider about getting a sleep evaluation if you snore a lot or have excessive sleepiness. Make sure that any other  medical conditions you  have, such as atrial fibrillation or atherosclerosis, are managed. Nutrition Follow instructions from your health care provider about what to eat or drink to help manage your health condition. These instructions may include: Reducing your daily calorie intake. Limiting how much salt (sodium) you use to 1,500 milligrams (mg) each day. Using only healthy fats for cooking, such as olive oil, canola oil, or sunflower oil. Eating healthy foods. You can do this by: Choosing foods that are high in fiber, such as whole grains, and fresh fruits and vegetables. Eating at least 5 servings of fruits and vegetables a day. Try to fill one-half of your plate with fruits and vegetables at each meal. Choosing lean protein foods, such as lean cuts of meat, poultry without skin, fish, tofu, beans, and nuts. Eating low-fat dairy products. Avoiding foods that are high in sodium. This can help lower blood pressure. Avoiding foods that have saturated fat, trans fat, and cholesterol. This can help prevent high cholesterol. Avoiding processed and prepared foods. Counting your daily carbohydrate intake.  Lifestyle If you drink alcohol: Limit how much you have to: 0-1 drink a day for women who are not pregnant. 0-2 drinks a day for men. Know how much alcohol is in your drink. In the [place] one drink equals one 12 oz bottle of beer (347m), one 5 oz glass of wine (1471m, or one 1 oz glass of hard liquor (4431m Do not use any products that contain nicotine or tobacco. These products include cigarettes, chewing tobacco, and vaping devices, such as e-cigarettes. If you need help quitting, ask your health care provider. Avoid secondhand smoke. Do not use drugs. Activity  Try to stay at a healthy weight. Get at least 30 minutes of exercise on most days, such as: Fast walking. Biking. Swimming. Medicines Take over-the-counter and prescription medicines only as told by your health care  provider. Aspirin or blood thinners (antiplatelets or anticoagulants) may be recommended to reduce your risk of forming blood clots that can lead to stroke. Avoid taking birth control pills. Talk to your health care provider about the risks of taking birth control pills if: You are over 35 66ars old. You smoke. You get very bad headaches. You have had a blood clot. Where to find more information American Stroke Association: www.strokeassociation.org Get help right away if: You or a loved one has any symptoms of a stroke. "BE FAST" is an easy way to remember the main warning signs of a stroke: B - Balance. Signs are dizziness, sudden trouble walking, or loss of balance. E - Eyes. Signs are trouble seeing or a sudden change in vision. F - Face. Signs are sudden weakness or numbness of the face, or the face or eyelid drooping on one side. A - Arms. Signs are weakness or numbness in an arm. This happens suddenly and usually on one side of the body. S - Speech. Signs are sudden trouble speaking, slurred speech, or trouble understanding what people say. T - Time. Time to call emergency services. Write down what time symptoms started. You or a loved one has other signs of a stroke, such as: A sudden, severe headache with no known cause. Nausea or vomiting. Seizure. These symptoms may represent a serious problem that is an emergency. Do not wait to see if the symptoms will go away. Get medical help right away. Call your local emergency services (911 in the [place] Do not drive yourself to the hospital. Summary You can help to prevent a stroke by eating healthy,  exercising, not smoking, limiting alcohol intake, and managing any medical conditions you may have. Do not use any products that contain nicotine or tobacco. These include cigarettes, chewing tobacco, and vaping devices, such as e-cigarettes. If you need help quitting, ask your health care provider. Remember "BE FAST" for warning signs of a  stroke. Get help right away if you or a loved one has any of these signs. This information is not intended to replace advice given to you by your health care provider. Make sure you discuss any questions you have with your health care provider. Document Revised: 12/18/2019 Document Reviewed: 12/18/2019 Elsevier Patient Education  [employer].

## 2022-05-11 NOTE — Telephone Encounter (Signed)
Pt's sister returning call regarding results. Please advise

## 2022-05-11 NOTE — Telephone Encounter (Signed)
Left the pt a message to call the office back to endorse lab results per [facility] Dunn PA-C.   Pt does not have a DPR on file to speak with her sister Enid Derry.      Message Received: 4 days ago Dunn, Dayna N, PA-C  P Cv Div Ch St Triage Please let pt know that labs show continued downtrending anemia. I discussed with Dr. [name] since she is on blood thinner. Patient denied bleeding at visit yesterday. Hemoccult was negative in the hospital. Dr. [name] suggests to continue Eliquis unless she develops any signs of bleeding like blood in stool/black stools or vomiting blood. We would recommend f/u ASAP with PCP to discuss further management of her anemia. Otherwise keep f/u as planned 07/2022. Thank you.

## 2022-05-12 NOTE — Therapy (Signed)
OUTPATIENT OCCUPATIONAL THERAPY TREATMENT & PROGRESS NOTE  Patient Name: Monique Henderson MRN: [id number] DOB:Jan 29, 1951, 71 y.o., female Today's Date: 05/13/2022  PCP: N/A REFERRING PROVIDER: Bary Leriche, PA-C  Progress Note  Reporting Period 04/01/22 to 05/13/22.  See note below for Objective Data and Assessment of Progress/Goals.       OT End of Session - 05/13/22 1318     Visit Number 8    Number of Visits 16    Date for OT Re-Evaluation 06/12/22    Authorization Type [employer] Medicare    Progress Note Due on Visit 18    OT Start Time 1359    OT Stop Time 1445    OT Time Calculation (min) 46 min    Activity Tolerance Patient tolerated treatment well;No increased pain;Patient limited by fatigue    Behavior During Therapy [facility] for tasks assessed/performed              Past Medical History:  Diagnosis Date   AF (paroxysmal atrial fibrillation) ([place]) 05/29/2019   on Coumadin   Aortic atherosclerosis ([place]) 07/05/2019   Aortic dissection ([place]) 04/04/2019   s/p repair   Bone spur 2008   Right calcaneal foot spur   Breast cancer ([place]) 2004   Ductal carcinoma in situ of the left breast; S/P left partial mastectomy 02/26/2003; S/P re-excision of cranial and lateral margins11/18/2004.radiation   Carotid artery disease (HCC)    Cerebral thrombosis with cerebral infarction 05/22/2019   Chronic HFrEF (heart failure with reduced ejection fraction) (HCC)    Chronic low back pain 06/22/2016   Chronic obstructive lung disease ([place]) 01/16/2017   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 Autumn Messing; S/P re-excision of superior and inferior margins 10/27/2012.    ESRD on hemodialysis ([place]) 05/29/2019   Essential hypertension 09/16/2006   GERD 09/16/2006   Hepatitis C    treated and RNA confirmed not detectable 01/2017   Insomnia 03/14/2015   Malnutrition of moderate degree 05/19/2019   Non compliance with medical  treatment 12/04/2017   Normocytic anemia    With thrombocytosis   Osteoarthritis    Right ureteral stone 2002   S/P lumbar spinal fusion 01/18/2014   S/P lumbar decompressive laminectomy, fusion, and plating for lumbar spinal stenosis on 05/27/2009 by Dr. Eustace .  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 Eustace .     Stroke (cerebrum) ([place])    Tobacco use disorder 04/19/2009   Uterine fibroid    Wears dentures    top   Past Surgical History:  Procedure Laterality Date   ANTERIOR LAT LUMBAR FUSION N/A 01/18/2014   Procedure: ANTERIOR LATERAL LUMBAR FUSION LUMBAR TWO-THREE;  Surgeon: Eustace , MD;  Location: [facility];  Service: Neurosurgery;  Laterality: N/A;   ANTERIOR LUMBAR FUSION  01/18/2014   AV FISTULA PLACEMENT Left 04/20/2019   Procedure: ARTERIOVENOUS (AV) FISTULA CREATION;  Surgeon: [name], MD;  Location: [facility];  Service: Vascular;  Laterality: Left;   BACK SURGERY     BREAST LUMPECTOMY Left 01/2003   BREAST LUMPECTOMY Right 2014   BREAST LUMPECTOMY WITH NEEDLE LOCALIZATION AND AXILLARY SENTINEL LYMPH NODE BX Right 10/13/2012   Procedure: BREAST LUMPECTOMY WITH NEEDLE LOCALIZATION;  Surgeon: [name], MD;  Location: [facility];  Service: General;  Laterality: Right;  Right breast wire localized lumpectomy   INSERTION OF DIALYSIS CATHETER Right 04/20/2019   Procedure: INSERTION OF DIALYSIS CATHETER, right internal jugular;  Surgeon: [name], MD;  Location: [facility];  Service: Vascular;  Laterality: Right;   INSERTION OF DIALYSIS CATHETER Right 09/24/2020   Procedure: INSERTION OF TUNNELED DIALYSIS CATHETER;  Surgeon: Rosetta Posner, MD;  Location: [facility];  Service: Vascular;  Laterality: Right;   IR FLUORO GUIDE CV  LINE RIGHT  09/22/2020   IR THORACENTESIS ASP PLEURAL SPACE W/IMG GUIDE  05/19/2019   IR US GUIDE VASC ACCESS LEFT  09/22/2020   IR US GUIDE VASC ACCESS RIGHT  09/22/2020   IR VENOCAVAGRAM SVC  09/22/2020   LAMINECTOMY  05/27/2009   Lumbar decompressive laminectomy, fusion and plating for lumbar spinal stensosis   LIGATION OF ARTERIOVENOUS  FISTULA Left 09/24/2020   Procedure: LIGATION OF LEFT ARM ARTERIOVENOUS  FISTULA;  Surgeon: Rosetta Posner, MD;  Location: [place];  Service: Vascular;  Laterality: Left;   LUMBAR LAMINECTOMY/DECOMPRESSION MICRODISCECTOMY Left 03/23/2013   Procedure: LUMBAR LAMINECTOMY/DECOMPRESSION MICRODISCECTOMY 1 LEVEL;  Surgeon: Eustace , MD;  Location: [facility];  Service: Neurosurgery;  Laterality: Left;  LUMBAR LAMINECTOMY/DECOMPRESSION MICRODISCECTOMY 1 LEVEL   MASTECTOMY, PARTIAL Left 02/26/2003   ; S/P re-excision of cranial and lateral margins 04/19/2003.    RE-EXCISION OF BREAST CANCER,SUPERIOR MARGINS Right 10/27/2012   Procedure: RE-EXCISION OF BREAST CANCER,SUPERIOR and inferior MARGINS;  Surgeon: [name], MD;  Location: [facility];  Service: General;  Laterality: Right;   RE-EXCISION OF BREAST LUMPECTOMY Left 04/2003   TEE WITHOUT CARDIOVERSION N/A 04/04/2019   Procedure: Transesophageal Echocardiogram ([place]);  Surgeon: Wonda Olds, MD;  Location: [facility];  Service: Open Heart Surgery;  Laterality: N/A;   THORACIC AORTIC ANEURYSM REPAIR N/A 04/04/2019   Procedure: THORACIC ASCENDING ANEURYSM REPAIR (AAA)  USING 28 MM X 30 CM HEMASHIELD PLATINUM VASCULAR GRAFT;  Surgeon: Wonda Olds, MD;  Location[facility];  Service: Open Heart Surgery;  Laterality: N/A;   Patient Active Problem List   Diagnosis Date Noted   Right hemiparesis ([place]) 04/06/2022   Acute cardioembolic stroke ([place]) 54/65/6812   Stroke (cerebrum) ([place]) 02/23/2022   Stroke ([place]) 02/23/2022   Hypervolemia associated with renal insufficiency 01/29/2022   Dyslipidemia 01/29/2022   DNR (do  not resuscitate) 01/29/2022   Polysubstance abuse ([place]) 01/06/2022   Acute on chronic combined systolic and diastolic CHF (congestive heart failure) ([place]) 01/06/2022   Abdominal pain 75/17/0017   Acute metabolic encephalopathy 49/44/9675   Malignant HTN with heart disease, w/o CHF, 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 12/24/2021   History of hepatitis C 12/08/2021   Hypertensive urgency 10/29/2021   Fluid overload 10/29/2021   Acute on chronic HFrEF (heart failure with reduced ejection fraction) ([place]) 10/29/2021   Chronic anticoagulation 10/29/2021   Prolonged QT interval 10/29/2021   Leukopenia 10/29/2021   SOB (shortness of breath) 09/15/2021   Hypertensive emergency 08/23/2021   Altered mental status    ESRD (end stage renal disease) on dialysis with hyperkalemia and metabolic acidosis with elevated AG  08/12/2021   Malignant hypertension 06/19/2021   Anemia 06/05/2021   Chronic diastolic congestive heart failure ([place]) 04/30/2021   Pure hypercholesterolemia 02/14/2021   Acute pulmonary edema ([place]) 12/23/2020   Acute respiratory failure with hypoxia ([place]) 09/21/2020   Acute renal failure superimposed on chronic kidney disease ([place]) 09/21/2020   Community acquired pneumonia 09/21/2020  Uremia 16/03/9603   Metabolic acidosis with increased anion gap and accumulation of organic acids 09/21/2020   Hypertensive crisis 09/21/2020   Troponin level elevated 08/28/2019   Positive D dimer 08/28/2019   Hyperkalemia 08/28/2019   SVT (supraventricular tachycardia) 08/28/2019   SIRS (systemic inflammatory response syndrome) ([place]) 08/27/2019   Paroxysmal SVT (supraventricular tachycardia) 08/27/2019   Aortic atherosclerosis ([place]) 07/05/2019   Chest pain 07/05/2019   History of CVA (cerebrovascular accident) 05/29/2019   AF (paroxysmal atrial fibrillation) ([place]) 05/29/2019   ESRD on dialysis ([place]) 05/29/2019   Cerebral thrombosis with cerebral infarction 05/22/2019   Malnutrition of moderate degree  05/19/2019   Pleural effusion 05/18/2019   Atrial fibrillation with RVR ([place]) 05/18/2019   S/P aortic aneurysm repair 04/07/2019   Aortic dissection ([place]) 04/04/2019   Dissection of aorta ([place]) 04/03/2019   Non compliance with medical treatment 12/04/2017   Chronic obstructive lung disease ([place]) 01/16/2017   Chronic low back pain 06/22/2016   Insomnia 03/14/2015   S/P lumbar spinal fusion 01/18/2014   Tobacco use disorder 04/19/2009   GERD 09/16/2006    ONSET DATE:  DOI: 02/22/22 (CVA)  REFERRING DIAG: I63.9 (ICD-10-CM) - Cerebral infarction, unspecified  THERAPY DIAG:  Muscle weakness (generalized)  Stiffness of right hand, not elsewhere classified  Other lack of coordination  Localized edema  Stiffness of right wrist, not elsewhere classified  Rationale for Evaluation and Treatment: Rehabilitation  PERTINENT HISTORY: She suffered a stroke on 02/22/22 to her Lt posterior-frontal parietal and anterior-parietal areas causing dysphagia, right hemiplegia, speech issues, and cognitive problems. She spent ~2 weeks in the hospital, in inpatient rehab. At discharge from inpatient rehab, she was noted to have: no right handed grip, 75% intelligible speech, SBA with no device for transfers, dysphagia, HTN, HLD, tachycardia, A-fib, CKD, and chest pain (other than cardiac).   PRECAUTIONS: Fall and Other: Rt hemiplegia, speech, swallow and cognitive issues  WEIGHT BEARING RESTRICTIONS: No   SUBJECTIVE:   SUBJECTIVE STATEMENT: She is now ~2.5 months post stroke.  She states her Rt thumb is aching.    PAIN:  Are you having pain? Yes Rating: 7/10 at rest now    PATIENT GOALS: To get right dom arm moving better.    OBJECTIVE: (All objective assessments below are from initial evaluation on: 04/01/22 unless otherwise specified.)    HAND DOMINANCE: Right   ADLs: Overall ADLs: 05/13/22: She states getting dressed is a problem    FUNCTIONAL OUTCOME MEASURES: 05/13/22: Quick  DASH 50% impairment today   Eval: [place]uck DASH 38.6% impairment today  (Higher % Score  =  More Impairment)     UPPER EXTREMITY ROM     Shoulder to Wrist AROM Right eval Rt  04/08/22 Rt 05/13/22  Shoulder flexion 108* in scaption 118* in scaption 90* in scaption  Shoulder abduction 128* 107* 92  Shoulder extension 68  45  Shoulder internal rotation   Belly touch  Shoulder external rotation   Reaches to rear hip  Elbow flexion 130 138* 118  Elbow extension (-10) (-10) (-10)  Forearm supination 65 51* 57  Forearm pronation  65 60* 85*  Wrist flexion 15  30 (PROM=67*)  Wrist extension 4  (-10) (PROM= 40*)   Wrist ulnar deviation 19  10  Wrist radial deviation 5  3  (Blank rows = not tested)  Eval: She has some (~10-15*) total motion in composite finger flexion and extension today with prolonged squeezes. Thumb motion is nil.   Hand AROM Right eval Rt  05/14/23  Full Fist Ability (or Gap to Distal Palmar Crease) 5.5cm gap from MF tip to [facility] ~9cm gap (no significant MF motion today)  Thumb Opposition to Small Finger (or Gap) unable Unable  Thumb Opposition to Base of Small Finger (or Gap)  unable Unable  (Blank rows = not tested)   UPPER EXTREMITY MMT:    05/13/22: Rt proximal arm grossly 3-/5 MMT today, and distal arm/hand grossly between 1/5 and 2-/5 MMT today   Eval:  Right Arm: Grossly 3+/5 at shoulder, 3+/5 at elbow, 3-/5 at forearm, 2-/5 at wrist and 1+/5 in fingers  MMT Right 05/13/22  Shoulder flexion   Shoulder abduction   Shoulder adduction   Shoulder extension   Shoulder internal rotation   Shoulder external rotation   Middle trapezius   Lower trapezius   Elbow flexion   Elbow extension   Forearm supination   Forearm pronation   Wrist flexion   Wrist extension   Wrist ulnar deviation   Wrist radial deviation   (Blank rows = not tested)  HAND FUNCTION: 05/13/22: Grip strength Right: 0 lbs  Eval: Observed weakness in affected hand.  Grip strength  Right: 0 lbs, Left: 26 lbs   COORDINATION: 05/13/22: Box and Blocks Test: Right Unable to grasp/hold block   Eval: Observed coordination impairments with affected rt hand. Box and Blocks Test: Right unable to grasp a block today (TBD is [facility])   EDEMA:   05/13/22: 41cm figure of 8 around Rt hand to start- much better with edema glove   04/13/22: 45cm figure of 8 around Rt hand to start   COGNITION: 05/13/22: Seems somewhat worse today- sleepy, nodding off again, word finding seems difficult and slurring words today.   Eval: Overall cognitive status: overtly seemed [facility] for evaluation today, though perhaps a bit impulsive.  She is working with SLP for this and speech   OBSERVATIONS:   05/13/22: Overall, her appearance and function seems to be on a steady decline since first evaluated.   05/06/22: She is much more alert today, bright eyed, had dialysis yesterday. She performs much better today.   Eval: She has fairly good motion at shoulder and elbow, speech can be difficult to understand    TODAY'S TREATMENT:  05/13/22: Pt performs AROM, gripping, and strength with Rt arm for exercise/activities as well as new measures today. She also attempts fnl activities- opening bottle, moving blocks, using Rt arm. Unfortunately she performs worse now in every aspect. OT also discusses home and functional tasks with the pt and reviews goals. Using the complied data, OT also quickly reviews home exercises and strongly urges her to be active and doing HEP at least 3-4 x every day if she desires to regain any use of her formerly dominant Rt arm.  Pt states understanding and does wish to try an additional month of therapy to improve and also learn compensatory strategies for lack of fnl use now.  OT does recommend a jar/bottle opener (as she could not open a bottle today with either hand, in any way), and also a shoulder sling to wear at night to prevent arm from "falling" in night causing pain (as she was  c/o of this today). OT also reviews/reminds her to be gently stretching her Rt thumb in [facility] J ext and flexion to help with pain, as she states "it hurts, and I just let it hurt."    PATIENT EDUCATION: Education details: See tx section above for details  Person educated: Patient  Education method: Veterinary surgeon, Teach back, Handouts  Education comprehension: States and demonstrates understanding, Additional Education required    HOME EXERCISE PROGRAM: Access Code: [email or web address] URL: [email or web address] Date: 04/08/2022 Prepared by: Benito Mccreedy   GOALS: Goals reviewed with patient? Yes  SHORT TERM GOALS: Target date: 04/17/22  Pt will demo/state understanding of initial home exercise program (HEP) to improve function, pain, and independence.   Baseline: Needs a plan for rehabilitation  Goal status: MET for basic HEP now.   2.  Pt will obtain protective, custom or mobilizing orthotic for safety and to improve function.  Baseline: Needs orthotic support  Goal status: TBD (This may be necessary to protect hand, if motion does not improve.)    LONG TERM GOALS: Target date: 06/12/22  Pt will improve functional ability by decreased impairment per Quick DASH assessment to 15% or better, for better quality of life. Baseline: 38.6% Goal status: 05/13/22: Not met- worse now 50%   2.  Pt will improve A/ROM in Rt wrist flex/ext to at least 40* each, to have prerequisite/functional motion for tasks like reach and grasp.  Baseline: 15* / 4* respectively  Goal status: 05/13/22: Not met, worse now   3.  Pt will improve strength in Rt elbow to at least 4+/5 MMT to have increased functional ability to carry out selfcare and higher-level homecare tasks with no difficulty Baseline: 3+/5 MMT Goal status: 05/13/22: Not Met- worse now   4.  Pt will improve coordination skills in Rt hand, as seen by increasing score on box and blocks testing to at least 20 blocks to have  increased functional ability to carry out fine motor tasks (fasteners, etc.) and more complex, coordinated IADLs (meal prep, sports, etc.).  Baseline: unable to grab a block Goal status: 05/13/22: Worse now   5.   Pt will improve grip strength in effected hand to at least 15# lbs for functional use at home and in IADLs. Baseline: 0 lbs Goal status: 05/13/22: Not met  6.   Pt will state knowledge of compensatory methods/adaptations to improve functional deficits with dressing, cooking, pain levels, and other home tasks.  Baseline: Is not using compensatory methods Goal status: 05/13/22: NEW GOAL TODAY   ASSESSMENT:  CLINICAL IMPRESSION: 05/13/22: Unfortunately she is doing worse in terms of range of motion strength and functional ability now than at the start of therapy plan of care.  The reason may be complications with extreme swelling due to dialysis treatments and right arm, or patient's lack of care and lethargy at home as she often states "I am just lazy."  We will attempt another month of therapy to improve ability, or work on compensation methods if she is truly not making any progress.  She was strongly cautioned to get more active and be doing home exercise program every day at least 3 or 4 times a day where she will not regain the use of her right dominant arm.  She was again sleepy, lethargic, not well focused in therapy today.  Speech issues and aphasia also seemed a bit worse today subjectively.    PLAN:  OT FREQUENCY: 2x/week  OT DURATION: additional 4 weeks (through 06/12/22 as needed)   PLANNED INTERVENTIONS: self care/ADL training, therapeutic exercise, therapeutic activity, neuromuscular re-education, manual therapy, passive range of motion, splinting, electrical stimulation, ultrasound, compression bandaging, moist heat, cryotherapy, contrast bath, patient/family education, coping strategies training, and DME and/or AE instructions  RECOMMENDED OTHER SERVICES: she is  already receiving SLP and PT, perhaps would  benefit from nutrition consult, if not already going, for CKD and stroke diet recommendations   CONSULTED AND AGREED WITH PLAN OF CARE: Patient and family member/caregiver  PLAN FOR NEXT SESSION:  Review full home exercise program and have her perform it back in the session to ensure understanding and compliance.  Continue to work on compensatory methods, and if thumb remains painful and orthotic may be necessary for rest and pain relief (long-term protection, compensation)    Benito Mccreedy, OTR/L, CHT 05/13/2022, 3:08 PM

## 2022-05-13 ENCOUNTER — Ambulatory Visit: Payer: Medicare Other | Admitting: Physical Therapy

## 2022-05-13 ENCOUNTER — Ambulatory Visit: Payer: Medicare Other | Admitting: Rehabilitative and Restorative Service Providers"

## 2022-05-13 ENCOUNTER — Encounter: Payer: Self-pay | Admitting: Physical Therapy

## 2022-05-13 ENCOUNTER — Encounter: Payer: Self-pay | Admitting: Rehabilitative and Restorative Service Providers"

## 2022-05-13 ENCOUNTER — Other Ambulatory Visit: Payer: Self-pay | Admitting: *Deleted

## 2022-05-13 DIAGNOSIS — M6281 Muscle weakness (generalized): Secondary | ICD-10-CM

## 2022-05-13 DIAGNOSIS — Z992 Dependence on renal dialysis: Secondary | ICD-10-CM

## 2022-05-13 DIAGNOSIS — R278 Other lack of coordination: Secondary | ICD-10-CM

## 2022-05-13 DIAGNOSIS — M25631 Stiffness of right wrist, not elsewhere classified: Secondary | ICD-10-CM

## 2022-05-13 DIAGNOSIS — M25641 Stiffness of right hand, not elsewhere classified: Secondary | ICD-10-CM

## 2022-05-13 DIAGNOSIS — I639 Cerebral infarction, unspecified: Secondary | ICD-10-CM

## 2022-05-13 DIAGNOSIS — R262 Difficulty in walking, not elsewhere classified: Secondary | ICD-10-CM

## 2022-05-13 DIAGNOSIS — R600 Localized edema: Secondary | ICD-10-CM

## 2022-05-13 NOTE — Progress Notes (Signed)
OUTPATIENT PHYSICAL THERAPY NEURO EVALUATION     Patient Name: Monique Henderson MRN: [id number] DOB:November 02, 1950, 71 y.o., female Today's Date: 12/1/32023     PCP: Dorna Mai REFERRING PROVIDER: Love           Past Medical History:  Diagnosis Date   AF (paroxysmal atrial fibrillation) ([place]) 05/29/2019    on Coumadin   Aortic atherosclerosis ([place]) 07/05/2019   Aortic dissection ([place]) 04/04/2019    s/p repair   Bone spur 2008    Right calcaneal foot spur   Breast cancer ([place]) 2004    Ductal carcinoma in situ of the left breast; S/P left partial mastectomy 02/26/2003; S/P re-excision of cranial and lateral margins11/18/2004.radiation   Cerebral thrombosis with cerebral infarction 05/22/2019   Chronic low back pain 06/22/2016   Chronic obstructive lung disease ([place]) 01/16/2017   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 Autumn Messing; S/P re-excision of superior and inferior margins 10/27/2012.    ESRD on hemodialysis ([place]) 05/29/2019   Essential hypertension 09/16/2006   GERD 09/16/2006   Hepatitis C      treated and RNA confirmed not detectable 01/2017   Insomnia 03/14/2015   Malnutrition of moderate degree 05/19/2019   Non compliance with medical treatment 12/04/2017   Normocytic anemia      With thrombocytosis   Osteoarthritis     Right ureteral stone 2002   S/P lumbar spinal fusion 01/18/2014    S/P lumbar decompressive laminectomy, fusion, and plating for lumbar spinal stenosis on 05/27/2009 by Dr. Eustace Moore.  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 Eustace Moore.     Tobacco use disorder 04/19/2009   Uterine fibroid     Wears dentures      top         Past Surgical History:  Procedure Laterality Date    ANTERIOR LAT LUMBAR FUSION N/A 01/18/2014    Procedure: ANTERIOR LATERAL LUMBAR FUSION LUMBAR TWO-THREE;  Surgeon: Eustace Moore, MD;  Location: [facility];  Service: Neurosurgery;  Laterality: N/A;   ANTERIOR LUMBAR FUSION   01/18/2014   AV FISTULA PLACEMENT Left 04/20/2019    Procedure: ARTERIOVENOUS (AV) FISTULA CREATION;  Surgeon: [name], MD;  Location: [facility];  Service: Vascular;  Laterality: Left;   BACK SURGERY       BREAST LUMPECTOMY Left 01/2003   BREAST LUMPECTOMY Right 2014   BREAST LUMPECTOMY WITH NEEDLE LOCALIZATION AND AXILLARY SENTINEL LYMPH NODE BX Right 10/13/2012    Procedure: BREAST LUMPECTOMY WITH NEEDLE LOCALIZATION;  Surgeon: [name], MD;  Location: [facility];  Service: General;  Laterality: Right;  Right breast wire localized lumpectomy   INSERTION OF DIALYSIS CATHETER Right 04/20/2019    Procedure: INSERTION OF DIALYSIS CATHETER, right internal jugular;  Surgeon: [name], MD;  Location: [facility];  Service: Vascular;  Laterality: Right;   INSERTION OF DIALYSIS CATHETER Right 09/24/2020    Procedure: INSERTION OF TUNNELED DIALYSIS CATHETER;  Surgeon: Rosetta Posner, MD;  Location: [facility];  Service: Vascular;  Laterality: Right;   IR FLUORO GUIDE CV LINE RIGHT   09/22/2020   IR THORACENTESIS ASP PLEURAL SPACE W/IMG GUIDE   05/19/2019   IR US GUIDE VASC ACCESS LEFT   09/22/2020  IR US GUIDE VASC ACCESS RIGHT   09/22/2020   IR VENOCAVAGRAM SVC   09/22/2020   LAMINECTOMY   05/27/2009    Lumbar decompressive laminectomy, fusion and plating for lumbar spinal stensosis   LIGATION OF ARTERIOVENOUS  FISTULA Left 09/24/2020    Procedure: LIGATION OF LEFT ARM ARTERIOVENOUS  FISTULA;  Surgeon: Rosetta Posner, MD;  Location: [facility];  Service: Vascular;  Laterality: Left;   LUMBAR LAMINECTOMY/DECOMPRESSION MICRODISCECTOMY Left 03/23/2013    Procedure: LUMBAR LAMINECTOMY/DECOMPRESSION MICRODISCECTOMY 1 LEVEL;  Surgeon: Eustace Moore, MD;   Location: [facility];  Service: Neurosurgery;  Laterality: Left;  LUMBAR LAMINECTOMY/DECOMPRESSION MICRODISCECTOMY 1 LEVEL   MASTECTOMY, PARTIAL Left 02/26/2003    ; S/P re-excision of cranial and lateral margins 04/19/2003.    RE-EXCISION OF BREAST CANCER,SUPERIOR MARGINS Right 10/27/2012    Procedure: RE-EXCISION OF BREAST CANCER,SUPERIOR and inferior MARGINS;  Surgeon: [name], MD;  Location: [facility];  Service: General;  Laterality: Right;   RE-EXCISION OF BREAST LUMPECTOMY Left 04/2003   TEE WITHOUT CARDIOVERSION N/A 04/04/2019    Procedure: Transesophageal Echocardiogram ([place]);  Surgeon: Wonda Olds, MD;  Location: [facility];  Service: Open Heart Surgery;  Laterality: N/A;   THORACIC AORTIC ANEURYSM REPAIR N/A 04/04/2019    Procedure: THORACIC ASCENDING ANEURYSM REPAIR (AAA)  USING 28 MM X 30 CM HEMASHIELD PLATINUM VASCULAR GRAFT;  Surgeon: Wonda Olds, MD;  [facility];  Service: Open Heart Surgery;  Laterality: N/A;        Patient Active Problem List    Diagnosis Date Noted   Acute cardioembolic stroke ([place]) 43/32/9518   Stroke (cerebrum) ([place]) 02/23/2022   Stroke ([place]) 02/23/2022   Hypervolemia associated with renal insufficiency 01/29/2022   Dyslipidemia 01/29/2022   DNR (do not resuscitate) 01/29/2022   Polysubstance abuse ([place]) 01/06/2022   Acute on chronic combined systolic and diastolic CHF (congestive heart failure) ([place]) 01/06/2022   Abdominal pain 84/16/6063   Acute metabolic encephalopathy 01/60/1093   Malignant HTN with heart disease, w/o CHF, 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 12/24/2021   History of hepatitis C 12/08/2021   Hypertensive urgency 10/29/2021   Fluid overload 10/29/2021   Acute on chronic HFrEF (heart failure with reduced ejection fraction) ([place]) 10/29/2021   Chronic anticoagulation 10/29/2021   Prolonged QT interval 10/29/2021   Leukopenia 10/29/2021   SOB (shortness of breath) 09/15/2021   Hypertensive emergency 08/23/2021   Altered mental  status     ESRD (end stage renal disease) on dialysis with hyperkalemia and metabolic acidosis with elevated AG  08/12/2021   Malignant hypertension 06/19/2021   Anemia 06/05/2021   Pure hypercholesterolemia 02/14/2021   Acute pulmonary edema ([place]) 12/23/2020   Acute respiratory failure with hypoxia ([place]) 09/21/2020   Acute renal failure superimposed on chronic kidney disease ([place]) 09/21/2020   Community acquired pneumonia 09/21/2020   Uremia 23/55/7322   Metabolic acidosis with increased anion gap and accumulation of organic acids 09/21/2020   Hypertensive crisis 09/21/2020   Troponin level elevated 08/28/2019   Positive D dimer 08/28/2019   Hyperkalemia 08/28/2019   SVT (supraventricular tachycardia) 08/28/2019   SIRS (systemic inflammatory response syndrome) ([place]) 08/27/2019   Paroxysmal SVT (supraventricular tachycardia) 08/27/2019   Aortic atherosclerosis ([place]) 07/05/2019   Chest pain 07/05/2019   History of CVA (cerebrovascular accident) 05/29/2019   AF (paroxysmal atrial fibrillation) ([place]) 05/29/2019   ESRD on dialysis ([place]) 05/29/2019   Cerebral thrombosis with cerebral infarction 05/22/2019   Malnutrition of moderate degree 05/19/2019   Pleural  effusion 05/18/2019   Atrial fibrillation with RVR ([place]) 05/18/2019   S/P aortic aneurysm repair 04/07/2019   Aortic dissection ([place]) 04/04/2019   Dissection of aorta ([place]) 04/03/2019   Non compliance with medical treatment 12/04/2017   Chronic obstructive lung disease ([place]) 01/16/2017   Chronic low back pain 06/22/2016   Insomnia 03/14/2015   S/P lumbar spinal fusion 01/18/2014   Tobacco use disorder 04/19/2009   GERD 09/16/2006      ONSET DATE: 02/24/22   REFERRING DIAG: Acute Stroke   THERAPY DIAG:  No diagnosis found.   Rationale for Evaluation and Treatment: Rehabilitation   SUBJECTIVE:                                                                                                                                                                                              SUBJECTIVE STATEMENT:   Pt is a 71 y.o. female who presents to [facility] 9[facility]25 with R sided facial droop, R UE weakness, dysarthria. MRI revealed acute to subacute cortical infarct in the L posterior frontal and anterior parietal lobe, smaller acute to subacute infarct in L caudate tail, occipital lobe and frontal lobe. Noted 8/31 admission due to volume overload.  PMH includes ESRD on HD TTS, HTN, PAF, polysubstance abuse, aortic aneurysm dissection s/p repair. Hospitalized 04/29/22 after non-compliance with HD due to severe R hand pain.      Pt accompanied by: self   PERTINENT HISTORY: see above   PAIN:  Are you having pain? no   PRECAUTIONS: None   WEIGHT BEARING RESTRICTIONS: No   FALLS: Has patient fallen in last 6 months? No   LIVING ENVIRONMENT: Lives with: lives with an adult companion Lives in: House/apartment Stairs: Yes: Internal: 15 steps; on left going up Has following equipment at home: None   PLOF:  cook, clean, some flowers   PATIENT GOALS: walk better,    OBJECTIVE:    COGNITION: Overall cognitive status: Within functional limits for tasks assessed             SENSATION: WFL   COORDINATION: LE pretty good, some difficulty with foot motions/ankle     LOWER EXTREMITY ROM:      Active  Right Eval Left Eval  Hip flexion 90    Hip extension      Hip abduction      Hip adduction      Hip internal rotation      Hip external rotation      Knee flexion      Knee extension 6    Ankle dorsiflexion 10    Ankle plantarflexion  Ankle inversion 5    Ankle eversion 12     (Blank rows = not tested)   LOWER EXTREMITY MMT:     MMT Right Eval Left Eval  Hip flexion 4-    Hip extension      Hip abduction 4    Hip adduction 4    Hip internal rotation      Hip external rotation      Knee flexion 4-    Knee extension 3+    Ankle dorsiflexion 4-    Ankle plantarflexion 4-    Ankle inversion 4-     Ankle eversion 4-    (Blank rows = not tested)   TRANSFERS: Assistive device utilized: None  Sit to stand: Complete Independence Stand to sit: Complete Independence   STAIRS: Level of Assistance: SBA Stair Negotiation Technique: Alternating Pattern  with Single Rail on Right Number of Stairs: 4  Height of Stairs: 6    Comments: some impulsivity and unsafe with turns   GAIT: Gait pattern: WFL Distance walked: 60 feet Assistive device utilized: None Level of assistance: SBA Comments: some issues with turns and on uneven surfaces   FUNCTIONAL TESTS:  5 times sit to stand: 16, 12/13-18 Timed up and go (TUG): 13, 10.58- unsteady during both turns. Berg Balance Scale: 48/56, 12/13- 51/56  TODAY'S TREATMENT:      05/13/22    NuStep L3 x 6 minutes Functional re-assessment for evaluation B side stepping on Airex beam, 5 x each direction. Runner's step ups onto airex pad with R leg x 10- min A with her hand for balance 4 square stepping clockwise, counter clockwise, then changing directions upon therapist's command. Gait assessment/training with increased speed, S x 150', more normalized gait pattern noted.                                                                                                                          PATIENT EDUCATION: Education details: POC, updates Person educated: Patient  Education method: Explanation Education comprehension: verbalized understanding   HOME EXERCISE PROGRAM: TBD for LE and balance     GOALS: Goals reviewed with patient? Yes   SHORT TERM GOALS: Target date: 05/29/22   Independent with initial HEP Goal status: INITIAL   LONG TERM GOALS: Target date: 07/16/22   Increase Berg balance score to 52/56 Goal status: 12/13-51/56,o ngoing   2.  Understand safety and fall risks at home Goal status: INITIAL   3.  Increase right LE strength to 4+/5 Goal status: INITIAL   4.  Walk >1000 feet uneven terrain without  difficulty Goal status: INITIAL   ASSESSMENT:   CLINICAL IMPRESSION: Patient is a 71 y.o. female who was seen for PT evaluation after she was hospitalized due to toxicity after missing dialysis. She had recently been evaled and was still being followed after a stroke. Her status remains largely unchanged, with mild RLE weakness, poor balance, distractibility. She will benefit from continuing with her previous  PT POC to address the strength and balance issues.   OBJECTIVE IMPAIRMENTS: Abnormal gait, cardiopulmonary status limiting activity, decreased activity tolerance, decreased balance, decreased coordination, decreased endurance, decreased mobility, difficulty walking, decreased ROM, decreased strength, and decreased safety awareness.    REHAB POTENTIAL: Good   CLINICAL DECISION MAKING: Stable/uncomplicated   EVALUATION COMPLEXITY: mod   PLAN:   PT FREQUENCY: 2x/week   PT DURATION: 9 weeks   PLANNED INTERVENTIONS: Therapeutic exercises, Therapeutic activity, Neuromuscular re-education, Balance training, Gait training, Patient/Family education, Self Care, Joint mobilization, Stair training, and Manual therapy   PLAN FOR NEXT SESSION: Overall she is progressing well after stroke, she has the most deficits with her right hand and her speech, some deficits with strength and motions of the LE, some impulsivity and balance issues     Ethel Rana DPT 05/13/22 2:39 PM

## 2022-05-18 ENCOUNTER — Ambulatory Visit: Payer: Medicare Other | Admitting: Rehabilitative and Restorative Service Providers"

## 2022-05-18 ENCOUNTER — Telehealth: Payer: Self-pay | Admitting: Rehabilitative and Restorative Service Providers"

## 2022-05-18 NOTE — Telephone Encounter (Signed)
OT called patient to discuss missed appointment. OT left message with pt about next appointment date/time. They were reminded of the attendance policy and future missed appointments could lead to early discharge from therapy.

## 2022-05-18 NOTE — Therapy (Incomplete)
OUTPATIENT OCCUPATIONAL THERAPY TREATMENT NOTE  Patient Name: Monique Henderson MRN: [id number] DOB:1950-11-26, 71 y.o., female Today's Date: 05/13/2022  PCP: N/A REFERRING PROVIDER: Bary Leriche, PA-C      OT End of Session - 05/13/22 1318     Visit Number 8    Number of Visits 16    Date for OT Re-Evaluation 06/12/22    Authorization Type [employer] Medicare    Progress Note Due on Visit 18    OT Start Time 1359    OT Stop Time 1445    OT Time Calculation (min) 46 min    Activity Tolerance Patient tolerated treatment well;No increased pain;Patient limited by fatigue    Behavior During Therapy [facility] for tasks assessed/performed              Past Medical History:  Diagnosis Date   AF (paroxysmal atrial fibrillation) ([place]) 05/29/2019   on Coumadin   Aortic atherosclerosis ([place]) 07/05/2019   Aortic dissection ([place]) 04/04/2019   s/p repair   Bone spur 2008   Right calcaneal foot spur   Breast cancer ([place]) 2004   Ductal carcinoma in situ of the left breast; S/P left partial mastectomy 02/26/2003; S/P re-excision of cranial and lateral margins11/18/2004.radiation   Carotid artery disease (HCC)    Cerebral thrombosis with cerebral infarction 05/22/2019   Chronic HFrEF (heart failure with reduced ejection fraction) (HCC)    Chronic low back pain 06/22/2016   Chronic obstructive lung disease ([place]) 01/16/2017   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 Autumn Messing; S/P re-excision of superior and inferior margins 10/27/2012.    ESRD on hemodialysis ([place]) 05/29/2019   Essential hypertension 09/16/2006   GERD 09/16/2006   Hepatitis C    treated and RNA confirmed not detectable 01/2017   Insomnia 03/14/2015   Malnutrition of moderate degree 05/19/2019   Non compliance with medical treatment 12/04/2017   Normocytic anemia    With thrombocytosis   Osteoarthritis    Right ureteral stone 2002   S/P lumbar spinal fusion  01/18/2014   S/P lumbar decompressive laminectomy, fusion, and plating for lumbar spinal stenosis on 05/27/2009 by Dr. Eustace .  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 Eustace .     Stroke (cerebrum) ([place])    Tobacco use disorder 04/19/2009   Uterine fibroid    Wears dentures    top   Past Surgical History:  Procedure Laterality Date   ANTERIOR LAT LUMBAR FUSION N/A 01/18/2014   Procedure: ANTERIOR LATERAL LUMBAR FUSION LUMBAR TWO-THREE;  Surgeon: Eustace , MD;  Location: [place] [facility];  Service: Neurosurgery;  Laterality: N/A;   ANTERIOR LUMBAR FUSION  01/18/2014   AV FISTULA PLACEMENT Left 04/20/2019   Procedure: ARTERIOVENOUS (AV) FISTULA CREATION;  Surgeon: [name], MD;  Location: [place];  Service: Vascular;  Laterality: Left;   BACK SURGERY     BREAST LUMPECTOMY Left 01/2003   BREAST LUMPECTOMY Right 2014   BREAST LUMPECTOMY WITH NEEDLE LOCALIZATION AND AXILLARY SENTINEL LYMPH NODE BX Right 10/13/2012   Procedure: BREAST LUMPECTOMY WITH NEEDLE LOCALIZATION;  Surgeon: [name], MD;  Location: [place];  Service: General;  Laterality: Right;  Right breast wire localized lumpectomy   INSERTION OF DIALYSIS CATHETER Right 04/20/2019  Procedure: INSERTION OF DIALYSIS CATHETER, right internal jugular;  Surgeon: [name], MD;  Location: [place];  Service: Vascular;  Laterality: Right;   INSERTION OF DIALYSIS CATHETER Right 09/24/2020   Procedure: INSERTION OF TUNNELED DIALYSIS CATHETER;  Surgeon: Rosetta Posner, MD;  Location: [place];  Service: Vascular;  Laterality: Right;   IR FLUORO GUIDE CV LINE RIGHT  09/22/2020   IR THORACENTESIS ASP PLEURAL SPACE W/IMG GUIDE  05/19/2019   IR US GUIDE VASC ACCESS LEFT  09/22/2020   IR US GUIDE  VASC ACCESS RIGHT  09/22/2020   IR VENOCAVAGRAM SVC  09/22/2020   LAMINECTOMY  05/27/2009   Lumbar decompressive laminectomy, fusion and plating for lumbar spinal stensosis   LIGATION OF ARTERIOVENOUS  FISTULA Left 09/24/2020   Procedure: LIGATION OF LEFT ARM ARTERIOVENOUS  FISTULA;  Surgeon: Rosetta Posner, MD;  Location: [place];  Service: Vascular;  Laterality: Left;   LUMBAR LAMINECTOMY/DECOMPRESSION MICRODISCECTOMY Left 03/23/2013   Procedure: LUMBAR LAMINECTOMY/DECOMPRESSION MICRODISCECTOMY 1 LEVEL;  Surgeon: Eustace , MD;  Location: [place] [facility]S;  Service: Neurosurgery;  Laterality: Left;  LUMBAR LAMINECTOMY/DECOMPRESSION MICRODISCECTOMY 1 LEVEL   MASTECTOMY, PARTIAL Left 02/26/2003   ; S/P re-excision of cranial and lateral margins 04/19/2003.    RE-EXCISION OF BREAST CANCER,SUPERIOR MARGINS Right 10/27/2012   Procedure: RE-EXCISION OF BREAST CANCER,SUPERIOR and inferior MARGINS;  Surgeon: [name], MD;  Location: [place];  Service: General;  Laterality: Right;   RE-EXCISION OF BREAST LUMPECTOMY Left 04/2003   TEE WITHOUT CARDIOVERSION N/A 04/04/2019   Procedure: Transesophageal Echocardiogram ([place]);  Surgeon: Wonda Olds, MD;  Location: [place];  Service: Open Heart Surgery;  Laterality: N/A;   THORACIC AORTIC ANEURYSM REPAIR N/A 04/04/2019   Procedure: THORACIC ASCENDING ANEURYSM REPAIR (AAA)  USING 28 MM X 30 CM HEMASHIELD PLATINUM VASCULAR GRAFT;  Surgeon: Wonda Olds, MD;  Location: [facility];  Service: Open Heart Surgery;  Laterality: N/A;   Patient Active Problem List   Diagnosis Date Noted   Right hemiparesis ([place]) 04/06/2022   Acute cardioembolic stroke ([place]) 16/03/9603   Stroke (cerebrum) ([place]) 02/23/2022   Stroke ([place]) 02/23/2022   Hypervolemia associated with renal insufficiency 01/29/2022   Dyslipidemia 01/29/2022   DNR (do not resuscitate) 01/29/2022   Polysubstance abuse ([place]) 01/06/2022   Acute on chronic combined systolic and diastolic CHF (congestive heart  failure) ([place]) 01/06/2022   Abdominal pain 54/01/8118   Acute metabolic encephalopathy 14/78/2956   Malignant HTN with heart disease, w/o CHF, 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 12/24/2021   History of hepatitis C 12/08/2021   Hypertensive urgency 10/29/2021   Fluid overload 10/29/2021   Acute on chronic HFrEF (heart failure with reduced ejection fraction) ([place]) 10/29/2021   Chronic anticoagulation 10/29/2021   Prolonged QT interval 10/29/2021   Leukopenia 10/29/2021   SOB (shortness of breath) 09/15/2021   Hypertensive emergency 08/23/2021   Altered mental status    ESRD (end stage renal disease) on dialysis with hyperkalemia and metabolic acidosis with elevated AG  08/12/2021   Malignant hypertension 06/19/2021   Anemia 06/05/2021   Chronic diastolic congestive heart failure ([place]) 04/30/2021   Pure hypercholesterolemia 02/14/2021   Acute pulmonary edema ([place]) 12/23/2020   Acute respiratory failure with hypoxia ([place]) 09/21/2020   Acute renal failure superimposed on chronic kidney disease ([place]) 09/21/2020   Community acquired pneumonia 09/21/2020   Uremia 21/30/8657   Metabolic acidosis with increased anion gap and accumulation of organic acids 09/21/2020   Hypertensive crisis 09/21/2020   Troponin  level elevated 08/28/2019   Positive D dimer 08/28/2019   Hyperkalemia 08/28/2019   SVT (supraventricular tachycardia) 08/28/2019   SIRS (systemic inflammatory response syndrome) ([place]) 08/27/2019   Paroxysmal SVT (supraventricular tachycardia) 08/27/2019   Aortic atherosclerosis ([place]) 07/05/2019   Chest pain 07/05/2019   History of CVA (cerebrovascular accident) 05/29/2019   AF (paroxysmal atrial fibrillation) ([place]) 05/29/2019   ESRD on dialysis ([place]) 05/29/2019   Cerebral thrombosis with cerebral infarction 05/22/2019   Malnutrition of moderate degree 05/19/2019   Pleural effusion 05/18/2019   Atrial fibrillation with RVR ([place]) 05/18/2019   S/P aortic aneurysm repair 04/07/2019   Aortic  dissection ([place]) 04/04/2019   Dissection of aorta ([place]) 04/03/2019   Non compliance with medical treatment 12/04/2017   Chronic obstructive lung disease ([place]) 01/16/2017   Chronic low back pain 06/22/2016   Insomnia 03/14/2015   S/P lumbar spinal fusion 01/18/2014   Tobacco use disorder 04/19/2009   GERD 09/16/2006    ONSET DATE:  DOI: 02/22/22 (CVA)  REFERRING DIAG: I63.9 (ICD-10-CM) - Cerebral infarction, unspecified  THERAPY DIAG:  Muscle weakness (generalized)  Stiffness of right hand, not elsewhere classified  Other lack of coordination  Localized edema  Stiffness of right wrist, not elsewhere classified  Rationale for Evaluation and Treatment: Rehabilitation  PERTINENT HISTORY: She suffered a stroke on 02/22/22 to her Lt posterior-frontal parietal and anterior-parietal areas causing dysphagia, right hemiplegia, speech issues, and cognitive problems. She spent ~2 weeks in the hospital, in inpatient rehab. At discharge from inpatient rehab, she was noted to have: no right handed grip, 75% intelligible speech, SBA with no device for transfers, dysphagia, HTN, HLD, tachycardia, A-fib, CKD, and chest pain (other than cardiac).   PRECAUTIONS: Fall and Other: Rt hemiplegia, speech, swallow and cognitive issues  WEIGHT BEARING RESTRICTIONS: No   SUBJECTIVE:   SUBJECTIVE STATEMENT: She states ***    PAIN:  Are you having pain? *** Yes Rating: *** 7/10 at rest now    PATIENT GOALS: To get right dom arm moving better.    OBJECTIVE: (All objective assessments below are from initial evaluation on: 04/01/22 unless otherwise specified.)    HAND DOMINANCE: Right   ADLs: Overall ADLs: 05/13/22: She states getting dressed is a problem    FUNCTIONAL OUTCOME MEASURES: 05/13/22: Quick DASH 50% impairment today   Eval: Quck DASH 38.6% impairment today  (Higher % Score  =  More Impairment)     UPPER EXTREMITY ROM     Shoulder to Wrist AROM Right eval Rt  04/08/22  Rt 05/13/22  Shoulder flexion 108* in scaption 118* in scaption 90* in scaption  Shoulder abduction 128* 107* 92  Shoulder extension 68  45  Shoulder internal rotation   Belly touch  Shoulder external rotation   Reaches to rear hip  Elbow flexion 130 138* 118  Elbow extension (-10) (-10) (-10)  Forearm supination 65 51* 57  Forearm pronation  65 60* 85*  Wrist flexion 15  30 (PROM=67*)  Wrist extension 4  (-10) (PROM= 40*)   Wrist ulnar deviation 19  10  Wrist radial deviation 5  3  (Blank rows = not tested)  Eval: She has some (~10-15*) total motion in composite finger flexion and extension today with prolonged squeezes. Thumb motion is nil.   Hand AROM Right eval Rt 05/14/23  Full Fist Ability (or Gap to Distal Palmar Crease) 5.5cm gap from MF tip to [facility] ~9cm gap (no significant MF motion today)  Thumb Opposition to Small Finger (or Gap) unable  Unable  Thumb Opposition to [employer] (or Gap)  unable Unable  (Blank rows = not tested)   UPPER EXTREMITY MMT:    05/13/22: Rt proximal arm grossly 3-/5 MMT today, and distal arm/hand grossly between 1/5 and 2-/5 MMT today   Eval:  Right Arm: Grossly 3+/5 at shoulder, 3+/5 at elbow, 3-/5 at forearm, 2-/5 at wrist and 1+/5 in fingers  MMT Right 05/13/22  Shoulder flexion   Shoulder abduction   Shoulder adduction   Shoulder extension   Shoulder internal rotation   Shoulder external rotation   Middle trapezius   Lower trapezius   Elbow flexion   Elbow extension   Forearm supination   Forearm pronation   Wrist flexion   Wrist extension   Wrist ulnar deviation   Wrist radial deviation   (Blank rows = not tested)  HAND FUNCTION: 05/13/22: Grip strength Right: 0 lbs  Eval: Observed weakness in affected hand.  Grip strength Right: 0 lbs, Left: 26 lbs   COORDINATION: 05/13/22: Box and Blocks Test: Right Unable to grasp/hold block   Eval: Observed coordination impairments with affected rt hand. Box and Blocks  Test: Right unable to grasp a block today (TBD is [facility])   EDEMA:   05/13/22: 41cm figure of 8 around Rt hand to start- much better with edema glove  04/13/22: 45cm figure of 8 around Rt hand to start   COGNITION: 05/13/22: Seems somewhat worse today- sleepy, nodding off again, word finding seems difficult and slurring words today.   Eval: Overall cognitive status: overtly seemed [facility] for evaluation today, though perhaps a bit impulsive.  She is working with SLP for this and speech   OBSERVATIONS:   05/13/22: Overall, her appearance and function seems to be on a steady decline since first evaluated.   05/06/22: She is much more alert today, bright eyed, had dialysis yesterday. She performs much better today.   Eval: She has fairly good motion at shoulder and elbow, speech can be difficult to understand    TODAY'S TREATMENT:  05/18/22: ***Review full home exercise program and have her perform it back in the session to ensure understanding and compliance.  Continue to work on compensatory methods, and if thumb remains painful and orthotic may be necessary for rest and pain relief (long-term protection, compensation)     05/13/22: Pt performs AROM, gripping, and strength with Rt arm for exercise/activities as well as new measures today. She also attempts fnl activities- opening bottle, moving blocks, using Rt arm. Unfortunately she performs worse now in every aspect. OT also discusses home and functional tasks with the pt and reviews goals. Using the complied data, OT also quickly reviews home exercises and strongly urges her to be active and doing HEP at least 3-4 x every day if she desires to regain any use of her formerly dominant Rt arm.  Pt states understanding and does wish to try an additional month of therapy to improve and also learn compensatory strategies for lack of fnl use now.  OT does recommend a jar/bottle opener (as she could not open a bottle today with either hand, in any  way), and also a shoulder sling to wear at night to prevent arm from "falling" in night causing pain (as she was c/o of this today). OT also reviews/reminds her to be gently stretching her Rt thumb in [facility] ext and flexion to help with pain, as she states "it hurts, and I just let it hurt."  PATIENT EDUCATION: Education details: See tx section above for details  Person educated: Patient Education method: Verbal Instruction, Teach back, Handouts  Education comprehension: States and demonstrates understanding, Additional Education required    HOME EXERCISE PROGRAM: Access Code: [email or web address] URL: [email or web address] Date: 04/08/2022 Prepared by: Benito Mccreedy   GOALS: Goals reviewed with patient? Yes  SHORT TERM GOALS: Target date: 04/17/22  Pt will demo/state understanding of initial home exercise program (HEP) to improve function, pain, and independence.   Baseline: Needs a plan for rehabilitation  Goal status: MET for basic HEP now.   2.  Pt will obtain protective, custom or mobilizing orthotic for safety and to improve function.  Baseline: Needs orthotic support  Goal status: TBD (This may be necessary to protect hand, if motion does not improve.)    LONG TERM GOALS: Target date: 06/12/22  Pt will improve functional ability by decreased impairment per Quick DASH assessment to 15% or better, for better quality of life. Baseline: 38.6% Goal status: 05/13/22: Not met- worse now 50%   2.  Pt will improve A/ROM in Rt wrist flex/ext to at least 40* each, to have prerequisite/functional motion for tasks like reach and grasp.  Baseline: 15* / 4* respectively  Goal status: 05/13/22: Not met, worse now   3.  Pt will improve strength in Rt elbow to at least 4+/5 MMT to have increased functional ability to carry out selfcare and higher-level homecare tasks with no difficulty Baseline: 3+/5 MMT Goal status: 05/13/22: Not Met- worse now   4.  Pt will improve  coordination skills in Rt hand, as seen by increasing score on box and blocks testing to at least 20 blocks to have increased functional ability to carry out fine motor tasks (fasteners, etc.) and more complex, coordinated IADLs (meal prep, sports, etc.).  Baseline: unable to grab a block Goal status: 05/13/22: Worse now   5.   Pt will improve grip strength in effected hand to at least 15# lbs for functional use at home and in IADLs. Baseline: 0 lbs Goal status: 05/13/22: Not met  6.   Pt will state knowledge of compensatory methods/adaptations to improve functional deficits with dressing, cooking, pain levels, and other home tasks.  Baseline: Is not using compensatory methods Goal status: 05/13/22: NEW GOAL TODAY   ASSESSMENT:  CLINICAL IMPRESSION: 05/18/22: ***  05/13/22: Unfortunately she is doing worse in terms of range of motion strength and functional ability now than at the start of therapy plan of care.  The reason may be complications with extreme swelling due to dialysis treatments and right arm, or patient's lack of care and lethargy at home as she often states "I am just lazy."  We will attempt another month of therapy to improve ability, or work on compensation methods if she is truly not making any progress.  She was strongly cautioned to get more active and be doing home exercise program every day at least 3 or 4 times a day where she will not regain the use of her right dominant arm.  She was again sleepy, lethargic, not well focused in therapy today.  Speech issues and aphasia also seemed a bit worse today subjectively.    PLAN:  OT FREQUENCY: 2x/week  OT DURATION: additional 4 weeks (through 06/12/22 as needed)   PLANNED INTERVENTIONS: self care/ADL training, therapeutic exercise, therapeutic activity, neuromuscular re-education, manual therapy, passive range of motion, splinting, electrical stimulation, ultrasound, compression bandaging, moist heat, cryotherapy, contrast  bath, patient/family education, coping strategies training, and  DME and/or AE instructions  RECOMMENDED OTHER SERVICES: she is already receiving SLP and PT, perhaps would benefit from nutrition consult, if not already going, for CKD and stroke diet recommendations   CONSULTED AND AGREED WITH PLAN OF CARE: Patient and family member/caregiver  PLAN FOR NEXT SESSION:  ***   Benito Mccreedy, OTR/L, CHT 05/13/2022, 3:08 PM

## 2022-05-18 NOTE — Therapy (Signed)
OUTPATIENT OCCUPATIONAL THERAPY TREATMENT NOTE  Patient Name: Monique Henderson MRN: [id number] DOB:[birth year], 71 y.o., female Today's Date: 05/20/2022  PCP: N/A REFERRING PROVIDER: Bary Leriche, PA-C      OT End of Session - 05/20/22 1512     Visit Number 9    Number of Visits 16    Date for OT Re-Evaluation 06/12/22    Authorization Type [employer] Medicare    Progress Note Due on Visit 18    OT Start Time 1513    OT Stop Time 1554    OT Time Calculation (min) 41 min    Activity Tolerance Patient tolerated treatment well;No increased pain;Patient limited by fatigue;Patient limited by pain    Behavior During Therapy [facility] for tasks assessed/performed              Past Medical History:  Diagnosis Date   AF (paroxysmal atrial fibrillation) ([place]) 05/29/2019   on Coumadin   Aortic atherosclerosis ([place]) 07/05/2019   Aortic dissection ([place]) 04/04/2019   s/p repair   Bone spur 2008   Right calcaneal foot spur   Breast cancer ([place]) 2004   Ductal carcinoma in situ of the left breast; S/P left partial mastectomy 02/26/2003; S/P re-excision of cranial and lateral margins11/18/2004.radiation   Carotid artery disease (HCC)    Cerebral thrombosis with cerebral infarction 05/22/2019   Chronic HFrEF (heart failure with reduced ejection fraction) (HCC)    Chronic low back pain 06/22/2016   Chronic obstructive lung disease ([place]) 01/16/2017   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 Autumn Messing; S/P re-excision of superior and inferior margins 10/27/2012.    ESRD on hemodialysis ([place]) 05/29/2019   Essential hypertension 09/16/2006   GERD 09/16/2006   Hepatitis C    treated and RNA confirmed not detectable 01/2017   Insomnia 03/14/2015   Malnutrition of moderate degree 05/19/2019   Non compliance with medical treatment 12/04/2017   Normocytic anemia    With thrombocytosis   Osteoarthritis    Right ureteral stone 2002   S/P  lumbar spinal fusion 01/18/2014   S/P lumbar decompressive laminectomy, fusion, and plating for lumbar spinal stenosis on 05/27/2009 by Dr. Eustace .  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 Eustace .     Stroke (cerebrum) ([place])    Tobacco use disorder 04/19/2009   Uterine fibroid    Wears dentures    top   Past Surgical History:  Procedure Laterality Date   ANTERIOR LAT LUMBAR FUSION N/A 01/18/2014   Procedure: ANTERIOR LATERAL LUMBAR FUSION LUMBAR TWO-THREE;  Surgeon: Eustace , MD;  Location: [place] [facility];  Service: Neurosurgery;  Laterality: N/A;   ANTERIOR LUMBAR FUSION  01/18/2014   AV FISTULA PLACEMENT Left 04/20/2019   Procedure: ARTERIOVENOUS (AV) FISTULA CREATION;  Surgeon: [name], MD;  Location: [place];  Service: Vascular;  Laterality: Left;   BACK SURGERY     BREAST LUMPECTOMY Left 01/2003   BREAST LUMPECTOMY Right 2014   BREAST LUMPECTOMY WITH NEEDLE LOCALIZATION AND AXILLARY SENTINEL LYMPH NODE BX Right 10/13/2012   Procedure: BREAST LUMPECTOMY WITH NEEDLE LOCALIZATION;  Surgeon: [name], MD;  Location: [place];  Service: General;  Laterality: Right;  Right breast wire localized lumpectomy   INSERTION OF DIALYSIS CATHETER  Right 04/20/2019   Procedure: INSERTION OF DIALYSIS CATHETER, right internal jugular;  Surgeon: [name], MD;  Location: [place];  Service: Vascular;  Laterality: Right;   INSERTION OF DIALYSIS CATHETER Right 09/24/2020   Procedure: INSERTION OF TUNNELED DIALYSIS CATHETER;  Surgeon: Rosetta Posner, MD;  Location: [place];  Service: Vascular;  Laterality: Right;   IR FLUORO GUIDE CV LINE RIGHT  09/22/2020   IR THORACENTESIS ASP PLEURAL SPACE W/IMG GUIDE  05/19/2019   IR US GUIDE VASC ACCESS LEFT   09/22/2020   IR US GUIDE VASC ACCESS RIGHT  09/22/2020   IR VENOCAVAGRAM SVC  09/22/2020   LAMINECTOMY  05/27/2009   Lumbar decompressive laminectomy, fusion and plating for lumbar spinal stensosis   LIGATION OF ARTERIOVENOUS  FISTULA Left 09/24/2020   Procedure: LIGATION OF LEFT ARM ARTERIOVENOUS  FISTULA;  Surgeon: Rosetta Posner, MD;  Location: [place];  Service: Vascular;  Laterality: Left;   LUMBAR LAMINECTOMY/DECOMPRESSION MICRODISCECTOMY Left 03/23/2013   Procedure: LUMBAR LAMINECTOMY/DECOMPRESSION MICRODISCECTOMY 1 LEVEL;  Surgeon: Eustace , MD;  Location: [place] [facility];  Service: Neurosurgery;  Laterality: Left;  LUMBAR LAMINECTOMY/DECOMPRESSION MICRODISCECTOMY 1 LEVEL   MASTECTOMY, PARTIAL Left 02/26/2003   ; S/P re-excision of cranial and lateral margins 04/19/2003.    RE-EXCISION OF BREAST CANCER,SUPERIOR MARGINS Right 10/27/2012   Procedure: RE-EXCISION OF BREAST CANCER,SUPERIOR and inferior MARGINS;  Surgeon: [name], MD;  Location: [place];  Service: General;  Laterality: Right;   RE-EXCISION OF BREAST LUMPECTOMY Left 04/2003   TEE WITHOUT CARDIOVERSION N/A 04/04/2019   Procedure: Transesophageal Echocardiogram ([place]);  Surgeon: Wonda Olds, MD;  Location: [facility];  Service: Open Heart Surgery;  Laterality: N/A;   THORACIC AORTIC ANEURYSM REPAIR N/A 04/04/2019   Procedure: THORACIC ASCENDING ANEURYSM REPAIR (AAA)  USING 28 MM X 30 CM HEMASHIELD PLATINUM VASCULAR GRAFT;  Surgeon: Wonda Olds, MD;  Location[facility];  Service: Open Heart Surgery;  Laterality: N/A;   Patient Active Problem List   Diagnosis Date Noted   Right hemiparesis ([place]) 04/06/2022   Acute cardioembolic stroke ([place]) 11/22/7626   Stroke (cerebrum) ([place]) 02/23/2022   Stroke ([place]) 02/23/2022   Hypervolemia associated with renal insufficiency 01/29/2022   Dyslipidemia 01/29/2022   DNR (do not resuscitate) 01/29/2022   Polysubstance abuse ([place]) 01/06/2022   Acute on chronic combined systolic and  diastolic CHF (congestive heart failure) ([place]) 01/06/2022   Abdominal pain 31/51/7616   Acute metabolic encephalopathy 07/37/1062   Malignant HTN with heart disease, w/o CHF, 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 12/24/2021   History of hepatitis C 12/08/2021   Hypertensive urgency 10/29/2021   Fluid overload 10/29/2021   Acute on chronic HFrEF (heart failure with reduced ejection fraction) ([place]) 10/29/2021   Chronic anticoagulation 10/29/2021   Prolonged QT interval 10/29/2021   Leukopenia 10/29/2021   SOB (shortness of breath) 09/15/2021   Hypertensive emergency 08/23/2021   Altered mental status    ESRD (end stage renal disease) on dialysis with hyperkalemia and metabolic acidosis with elevated AG  08/12/2021   Malignant hypertension 06/19/2021   Anemia 06/05/2021   Chronic diastolic congestive heart failure ([place]) 04/30/2021   Pure hypercholesterolemia 02/14/2021   Acute pulmonary edema ([place]) 12/23/2020   Acute respiratory failure with hypoxia ([place]) 09/21/2020   Acute renal failure superimposed on chronic kidney disease ([place]) 09/21/2020   Community acquired pneumonia 09/21/2020   Uremia 69/48/5462   Metabolic acidosis with increased anion gap and accumulation of organic acids 09/21/2020   Hypertensive crisis  09/21/2020   Troponin level elevated 08/28/2019   Positive D dimer 08/28/2019   Hyperkalemia 08/28/2019   SVT (supraventricular tachycardia) 08/28/2019   SIRS (systemic inflammatory response syndrome) ([place]) 08/27/2019   Paroxysmal SVT (supraventricular tachycardia) 08/27/2019   Aortic atherosclerosis ([place]) 07/05/2019   Chest pain 07/05/2019   History of CVA (cerebrovascular accident) 05/29/2019   AF (paroxysmal atrial fibrillation) ([place]) 05/29/2019   ESRD on dialysis ([place]) 05/29/2019   Cerebral thrombosis with cerebral infarction 05/22/2019   Malnutrition of moderate degree 05/19/2019   Pleural effusion 05/18/2019   Atrial fibrillation with RVR ([place]) 05/18/2019   S/P aortic  aneurysm repair 04/07/2019   Aortic dissection ([place]) 04/04/2019   Dissection of aorta ([place]) 04/03/2019   Non compliance with medical treatment 12/04/2017   Chronic obstructive lung disease ([place]) 01/16/2017   Chronic low back pain 06/22/2016   Insomnia 03/14/2015   S/P lumbar spinal fusion 01/18/2014   Tobacco use disorder 04/19/2009   GERD 09/16/2006    ONSET DATE:  DOI: 02/22/22 (CVA)  REFERRING DIAG: I63.9 (ICD-10-CM) - Cerebral infarction, unspecified  THERAPY DIAG:  Cognitive communication deficit  Stiffness of right hand, not elsewhere classified  Muscle weakness (generalized)  Other lack of coordination  Localized edema  Stiffness of right wrist, not elsewhere classified  Rationale for Evaluation and Treatment: Rehabilitation  PERTINENT HISTORY: She suffered a stroke on 02/22/22 to her Lt posterior-frontal parietal and anterior-parietal areas causing dysphagia, right hemiplegia, speech issues, and cognitive problems. She spent ~2 weeks in the hospital, in inpatient rehab. At discharge from inpatient rehab, she was noted to have: no right handed grip, 75% intelligible speech, SBA with no device for transfers, dysphagia, HTN, HLD, tachycardia, A-fib, CKD, and chest pain (other than cardiac).   PRECAUTIONS: Fall and Other: Rt hemiplegia, speech, swallow and cognitive issues  WEIGHT BEARING RESTRICTIONS: No   SUBJECTIVE:   SUBJECTIVE STATEMENT: She no-showed last appointment. Today she states she has been working her fingers and shows that she can move them some today. No thumb pain now.    PAIN:  Are you having pain? No Rating: 0/10 at rest now    PATIENT GOALS: To get right dom arm moving better.    OBJECTIVE: (All objective assessments below are from initial evaluation on: 04/01/22 unless otherwise specified.)    HAND DOMINANCE: Right   ADLs: Overall ADLs: 05/13/22: She states getting dressed is a problem    FUNCTIONAL OUTCOME MEASURES: 05/13/22: Quick  DASH 50% impairment today   Eval: Quck DASH 38.6% impairment today  (Higher % Score  =  More Impairment)     UPPER EXTREMITY ROM     Shoulder to Wrist AROM Right eval Rt  04/08/22 Rt 05/13/22  Shoulder flexion 108* in scaption 118* in scaption 90* in scaption  Shoulder abduction 128* 107* 92  Shoulder extension 68  45  Shoulder internal rotation   Belly touch  Shoulder external rotation   Reaches to rear hip  Elbow flexion 130 138* 118  Elbow extension (-10) (-10) (-10)  Forearm supination 65 51* 57  Forearm pronation  65 60* 85*  Wrist flexion 15  30 (PROM=67*)  Wrist extension 4  (-10) (PROM= 40*)   Wrist ulnar deviation 19  10  Wrist radial deviation 5  3  (Blank rows = not tested)  Eval: She has some (~10-15*) total motion in composite finger flexion and extension today with prolonged squeezes. Thumb motion is nil.   Hand AROM Right eval Rt 05/14/23  Full Fist Ability (  or Gap to Distal Palmar Crease) 5.5cm gap from MF tip to [facility] ~9cm gap (no significant MF motion today)  Thumb Opposition to Small Finger (or Gap) unable Unable  Thumb Opposition to Base of Small Finger (or Gap)  unable Unable  (Blank rows = not tested)   UPPER EXTREMITY MMT:    05/13/22: Rt proximal arm grossly 3-/5 MMT today, and distal arm/hand grossly between 1/5 and 2-/5 MMT today   Eval:  Right Arm: Grossly 3+/5 at shoulder, 3+/5 at elbow, 3-/5 at forearm, 2-/5 at wrist and 1+/5 in fingers  MMT Right 05/13/22  Shoulder flexion   Shoulder abduction   Shoulder adduction   Shoulder extension   Shoulder internal rotation   Shoulder external rotation   Middle trapezius   Lower trapezius   Elbow flexion   Elbow extension   Forearm supination   Forearm pronation   Wrist flexion   Wrist extension   Wrist ulnar deviation   Wrist radial deviation   (Blank rows = not tested)  HAND FUNCTION: 05/13/22: Grip strength Right: 0 lbs  Eval: Observed weakness in affected hand.  Grip strength  Right: 0 lbs, Left: 26 lbs   COORDINATION: 05/13/22: Box and Blocks Test: Right Unable to grasp/hold block   Eval: Observed coordination impairments with affected rt hand. Box and Blocks Test: Right unable to grasp a block today (TBD is [facility])   EDEMA:   05/13/22: 41cm figure of 8 around Rt hand to start- much better with edema glove  04/13/22: 45cm figure of 8 around Rt hand to start   COGNITION: 05/13/22: Seems somewhat worse today- sleepy, nodding off again, word finding seems difficult and slurring words today.   Eval: Overall cognitive status: overtly seemed [facility] for evaluation today, though perhaps a bit impulsive.  She is working with SLP for this and speech   OBSERVATIONS:   05/13/22: Overall, her appearance and function seems to be on a steady decline since first evaluated.    TODAY'S TREATMENT:  05/20/22: OT does review of full home exercise program with her, showing her how to adapt AROM to [facility] or PROM as needed, also reviewing PNF D1 flex and ext (neuro), and she tolerates well with mild pain in stiff shoulder at times,and also does have some thumb basal joint pain at times as well. She was limiting her IF flexion because her thumb was blocking it, and OT points this out- advises do not let thumb get in the way of her IF motion.  This will likely need reviewed. OT finishes with a few thumb CMC J stretches manually to help relieve pain, then she does fnl activity to catch/throw large, light ball and does fairly well with gross sh, elbow coordination.    PATIENT EDUCATION: Education details: See tx section above for details  Person educated: Patient Education method: Verbal Instruction, Teach back, Handouts  Education comprehension: States and demonstrates understanding, Additional Education required    HOME EXERCISE PROGRAM: Access Code: [email or web address] URL: [email or web address] Date: 04/08/2022 Prepared by: Benito Mccreedy   GOALS: Goals reviewed with  patient? Yes  SHORT TERM GOALS: Target date: 04/17/22  Pt will demo/state understanding of initial home exercise program (HEP) to improve function, pain, and independence.   Baseline: Needs a plan for rehabilitation  Goal status: MET for basic HEP now.   2.  Pt will obtain protective, custom or mobilizing orthotic for safety and to improve function.  Baseline: Needs orthotic support  Goal status: TBD (This may be necessary to  protect hand, if motion does not improve.)    LONG TERM GOALS: Target date: 06/12/22  Pt will improve functional ability by decreased impairment per Quick DASH assessment to 15% or better, for better quality of life. Baseline: 38.6% Goal status: 05/13/22: Not met- worse now 50%   2.  Pt will improve A/ROM in Rt wrist flex/ext to at least 40* each, to have prerequisite/functional motion for tasks like Henderson and grasp.  Baseline: 15* / 4* respectively  Goal status: 05/13/22: Not met, worse now   3.  Pt will improve strength in Rt elbow to at least 4+/5 MMT to have increased functional ability to carry out selfcare and higher-level homecare tasks with no difficulty Baseline: 3+/5 MMT Goal status: 05/13/22: Not Met- worse now   4.  Pt will improve coordination skills in Rt hand, as seen by increasing score on box and blocks testing to at least 20 blocks to have increased functional ability to carry out fine motor tasks (fasteners, etc.) and more complex, coordinated IADLs (meal prep, sports, etc.).  Baseline: unable to grab a block Goal status: 05/13/22: Worse now   5.   Pt will improve grip strength in effected hand to at least 15# lbs for functional use at home and in IADLs. Baseline: 0 lbs Goal status: 05/13/22: Not met  6.   Pt will state knowledge of compensatory methods/adaptations to improve functional deficits with dressing, cooking, pain levels, and other home tasks.  Baseline: Is not using compensatory methods Goal status: 05/13/22: NEW GOAL  TODAY   ASSESSMENT:  CLINICAL IMPRESSION: 05/20/22: She has been missing some appointments recently, but still doing fairly well, was more alert today and fingers seemed to be moving better than ever so far.  Continue on   05/13/22: Unfortunately she is doing worse in terms of range of motion strength and functional ability now than at the start of therapy plan of care.  The reason may be complications with extreme swelling due to dialysis treatments and right arm, or patient's lack of care and lethargy at home as she often states "I am just lazy."  We will attempt another month of therapy to improve ability, or work on compensation methods if she is truly not making any progress.  She was strongly cautioned to get more active and be doing home exercise program every day at least 3 or 4 times a day where she will not regain the use of her right dominant arm.  She was again sleepy, lethargic, not well focused in therapy today.  Speech issues and aphasia also seemed a bit worse today subjectively.    PLAN:  OT FREQUENCY: 2x/week  OT DURATION: additional 4 weeks (through 06/12/22 as needed)   PLANNED INTERVENTIONS: self care/ADL training, therapeutic exercise, therapeutic activity, neuromuscular re-education, manual therapy, passive range of motion, splinting, electrical stimulation, ultrasound, compression bandaging, moist heat, cryotherapy, contrast bath, patient/family education, coping strategies training, and DME and/or AE instructions  RECOMMENDED OTHER SERVICES: she is already receiving SLP and PT, perhaps would benefit from nutrition consult, if not already going, for CKD and stroke diet recommendations   CONSULTED AND AGREED WITH PLAN OF CARE: Patient and family member/caregiver  PLAN FOR NEXT SESSION:  Continue POC and emphasize understandable, repetitive practice of motions, AAROM, stretches where tight and fnl activities as able. Consider fnl thumb orthotic for proper positioning and  facilitation of pinch/grip.    Benito Mccreedy, OTR/L, CHT 05/20/2022, 3:54 PM

## 2022-05-20 ENCOUNTER — Encounter: Payer: Self-pay | Admitting: Rehabilitative and Restorative Service Providers"

## 2022-05-20 ENCOUNTER — Ambulatory Visit: Payer: Medicare Other | Admitting: Speech Pathology

## 2022-05-20 ENCOUNTER — Ambulatory Visit: Payer: Medicare Other | Admitting: Rehabilitative and Restorative Service Providers"

## 2022-05-20 DIAGNOSIS — R41841 Cognitive communication deficit: Secondary | ICD-10-CM

## 2022-05-20 DIAGNOSIS — M25631 Stiffness of right wrist, not elsewhere classified: Secondary | ICD-10-CM

## 2022-05-20 DIAGNOSIS — M25641 Stiffness of right hand, not elsewhere classified: Secondary | ICD-10-CM

## 2022-05-20 DIAGNOSIS — R278 Other lack of coordination: Secondary | ICD-10-CM

## 2022-05-20 DIAGNOSIS — M6281 Muscle weakness (generalized): Secondary | ICD-10-CM

## 2022-05-20 DIAGNOSIS — R600 Localized edema: Secondary | ICD-10-CM

## 2022-05-20 DIAGNOSIS — R471 Dysarthria and anarthria: Secondary | ICD-10-CM

## 2022-05-20 NOTE — Therapy (Signed)
OUTPATIENT SPEECH LANGUAGE PATHOLOGY EVALUATION   Patient Name: Monique Henderson MRN: [id number] DOB:27-Mar-1951, 71 y.o., female Today's Date: 05/20/2022  PCP: Dr. Dorna Mai REFERRING PROVIDER: PA-C Reesa Chew   End of Session - 05/20/22 1106     Visit Number 6    Number of Visits 13    Date for SLP Re-Evaluation 07/07/22    Authorization Type [employer] Medicare    Progress Note Due on Visit 10    SLP Start Time 1104    SLP Stop Time  1145    SLP Time Calculation (min) 41 min    Activity Tolerance Patient tolerated treatment well   per pt, feels "unwell," BP 113/53               Past Medical History:  Diagnosis Date   AF (paroxysmal atrial fibrillation) ([place]) 05/29/2019   on Coumadin   Aortic atherosclerosis ([place]) 07/05/2019   Aortic dissection ([place]) 04/04/2019   s/p repair   Bone spur 2008   Right calcaneal foot spur   Breast cancer ([place]) 2004   Ductal carcinoma in situ of the left breast; S/P left partial mastectomy 02/26/2003; S/P re-excision of cranial and lateral margins11/18/2004.radiation   Carotid artery disease (HCC)    Cerebral thrombosis with cerebral infarction 05/22/2019   Chronic HFrEF (heart failure with reduced ejection fraction) (HCC)    Chronic low back pain 06/22/2016   Chronic obstructive lung disease ([place]) 01/16/2017   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 Autumn Messing; S/P re-excision of superior and inferior margins 10/27/2012.    ESRD on hemodialysis ([place]) 05/29/2019   Essential hypertension 09/16/2006   GERD 09/16/2006   Hepatitis C    treated and RNA confirmed not detectable 01/2017   Insomnia 03/14/2015   Malnutrition of moderate degree 05/19/2019   Non compliance with medical treatment 12/04/2017   Normocytic anemia    With thrombocytosis   Osteoarthritis    Right ureteral stone 2002   S/P lumbar spinal fusion 01/18/2014   S/P lumbar decompressive laminectomy, fusion,  and plating for lumbar spinal stenosis on 05/27/2009 by Dr. Eustace Moore.  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 Eustace Moore.     Stroke (cerebrum) ([place])    Tobacco use disorder 04/19/2009   Uterine fibroid    Wears dentures    top   Past Surgical History:  Procedure Laterality Date   ANTERIOR LAT LUMBAR FUSION N/A 01/18/2014   Procedure: ANTERIOR LATERAL LUMBAR FUSION LUMBAR TWO-THREE;  Surgeon: Eustace Moore, MD;  Location: [facility];  Service: Neurosurgery;  Laterality: N/A;   ANTERIOR LUMBAR FUSION  01/18/2014   AV FISTULA PLACEMENT Left 04/20/2019   Procedure: ARTERIOVENOUS (AV) FISTULA CREATION;  Surgeon: [name], MD;  Location: [facility];  Service: Vascular;  Laterality: Left;   BACK SURGERY     BREAST LUMPECTOMY Left 01/2003   BREAST LUMPECTOMY Right 2014   BREAST LUMPECTOMY WITH NEEDLE LOCALIZATION AND AXILLARY SENTINEL LYMPH NODE BX Right 10/13/2012   Procedure: BREAST LUMPECTOMY WITH NEEDLE LOCALIZATION;  Surgeon: [name], MD;  Location: [facility];  Service: General;  Laterality: Right;  Right breast wire localized lumpectomy   INSERTION OF DIALYSIS CATHETER Right 04/20/2019   Procedure: INSERTION OF DIALYSIS CATHETER, right  internal jugular;  Surgeon: [name], MD;  Location: [facility];  Service: Vascular;  Laterality: Right;   INSERTION OF DIALYSIS CATHETER Right 09/24/2020   Procedure: INSERTION OF TUNNELED DIALYSIS CATHETER;  Surgeon: Rosetta Posner, MD;  Location: [facility];  Service: Vascular;  Laterality: Right;   IR FLUORO GUIDE CV LINE RIGHT  09/22/2020   IR THORACENTESIS ASP PLEURAL SPACE W/IMG GUIDE  05/19/2019   IR US GUIDE VASC ACCESS LEFT  09/22/2020   IR US GUIDE VASC ACCESS RIGHT  09/22/2020   IR VENOCAVAGRAM SVC   09/22/2020   LAMINECTOMY  05/27/2009   Lumbar decompressive laminectomy, fusion and plating for lumbar spinal stensosis   LIGATION OF ARTERIOVENOUS  FISTULA Left 09/24/2020   Procedure: LIGATION OF LEFT ARM ARTERIOVENOUS  FISTULA;  Surgeon: Rosetta Posner, MD;  Location: [facility];  Service: Vascular;  Laterality: Left;   LUMBAR LAMINECTOMY/DECOMPRESSION MICRODISCECTOMY Left 03/23/2013   Procedure: LUMBAR LAMINECTOMY/DECOMPRESSION MICRODISCECTOMY 1 LEVEL;  Surgeon: Eustace Moore, MD;  Location: [facility];  Service: Neurosurgery;  Laterality: Left;  LUMBAR LAMINECTOMY/DECOMPRESSION MICRODISCECTOMY 1 LEVEL   MASTECTOMY, PARTIAL Left 02/26/2003   ; S/P re-excision of cranial and lateral margins 04/19/2003.    RE-EXCISION OF BREAST CANCER,SUPERIOR MARGINS Right 10/27/2012   Procedure: RE-EXCISION OF BREAST CANCER,SUPERIOR and inferior MARGINS;  Surgeon: [name], MD;  Location: [facility];  Service: General;  Laterality: Right;   RE-EXCISION OF BREAST LUMPECTOMY Left 04/2003   TEE WITHOUT CARDIOVERSION N/A 04/04/2019   Procedure: Transesophageal Echocardiogram ([place]);  Surgeon: Wonda Olds, MD;  Location: [facility];  Service: Open Heart Surgery;  Laterality: N/A;   THORACIC AORTIC ANEURYSM REPAIR N/A 04/04/2019   Procedure: THORACIC ASCENDING ANEURYSM REPAIR (AAA)  USING 28 MM X 30 CM HEMASHIELD PLATINUM VASCULAR GRAFT;  Surgeon: Wonda Olds, MD;  Location: [facility];  Service: Open Heart Surgery;  Laterality: N/A;   Patient Active Problem List   Diagnosis Date Noted   Right hemiparesis ([place]) 04/06/2022   Acute cardioembolic stroke ([place]) 51/07/5850   Stroke (cerebrum) ([place]) 02/23/2022   Stroke ([place]) 02/23/2022   Hypervolemia associated with renal insufficiency 01/29/2022   Dyslipidemia 01/29/2022   DNR (do not resuscitate) 01/29/2022   Polysubstance abuse ([place]) 01/06/2022   Acute on chronic combined systolic and diastolic CHF (congestive heart failure) ([place]) 01/06/2022   Abdominal pain  77/82/4235   Acute metabolic encephalopathy 36/14/4315   Malignant HTN with heart disease, w/o CHF, 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 12/24/2021   History of hepatitis C 12/08/2021   Hypertensive urgency 10/29/2021   Fluid overload 10/29/2021   Acute on chronic HFrEF (heart failure with reduced ejection fraction) ([place]) 10/29/2021   Chronic anticoagulation 10/29/2021   Prolonged QT interval 10/29/2021   Leukopenia 10/29/2021   SOB (shortness of breath) 09/15/2021   Hypertensive emergency 08/23/2021   Altered mental status    ESRD (end stage renal disease) on dialysis with hyperkalemia and metabolic acidosis with elevated AG  08/12/2021   Malignant hypertension 06/19/2021   Anemia 06/05/2021   Chronic diastolic congestive heart failure ([place]) 04/30/2021   Pure hypercholesterolemia 02/14/2021   Acute pulmonary edema ([place]) 12/23/2020   Acute respiratory failure with hypoxia ([place]) 09/21/2020   Acute renal failure superimposed on chronic kidney disease ([place]) 09/21/2020   Community acquired pneumonia 09/21/2020   Uremia 40/12/6759   Metabolic acidosis with increased anion gap and accumulation of organic acids 09/21/2020   Hypertensive crisis 09/21/2020   Troponin level elevated 08/28/2019   Positive  D dimer 08/28/2019   Hyperkalemia 08/28/2019   SVT (supraventricular tachycardia) 08/28/2019   SIRS (systemic inflammatory response syndrome) ([place]) 08/27/2019   Paroxysmal SVT (supraventricular tachycardia) 08/27/2019   Aortic atherosclerosis ([place]) 07/05/2019   Chest pain 07/05/2019   History of CVA (cerebrovascular accident) 05/29/2019   AF (paroxysmal atrial fibrillation) ([place]) 05/29/2019   ESRD on dialysis ([place]) 05/29/2019   Cerebral thrombosis with cerebral infarction 05/22/2019   Malnutrition of moderate degree 05/19/2019   Pleural effusion 05/18/2019   Atrial fibrillation with RVR ([place]) 05/18/2019   S/P aortic aneurysm repair 04/07/2019   Aortic dissection ([place]) 04/04/2019   Dissection of  aorta ([place]) 04/03/2019   Non compliance with medical treatment 12/04/2017   Chronic obstructive lung disease ([place]) 01/16/2017   Chronic low back pain 06/22/2016   Insomnia 03/14/2015   S/P lumbar spinal fusion 01/18/2014   Tobacco use disorder 04/19/2009   GERD 09/16/2006    ONSET DATE: 02/23/2022   REFERRING DIAG: I63.9 (ICD-10-CM) - Cerebral infarction, unspecified   THERAPY DIAG:  Dysarthria and anarthria  Cognitive communication deficit  Rationale for Evaluation and Treatment: Rehabilitation  SUBJECTIVE:   SUBJECTIVE STATEMENT: "It's getting better"  PAIN:  Are you having pain? denies   OBJECTIVE:   TODAY'S TREATMENT: 05-20-22: Pt reports family is noticing a positive change in clarity of verbal communication. Pt ID improved ability to employ dysarthria strategies has resulted in reduced instances of requests to repeat self. Target improved intelligibility through employment of dysarthria strategies during naming activity in which pt generates x3 items. Pt with intelligible approximation in 90% of opportunities, demonstrating reduced rate, over-articulation, and syllabification PRN. When asked open-ended follow up question, pt evidences intelligible response in 70% of opportunities given usual mod-A. In connected speech, pt with increased difficulty maintaining rate and clear production of phonemes but does benefit from preliminary reminder for use of strategies. Usual verbal cues to swallow prior to initiation of speech to improve secretion management and improve clarity of speech. SLP reviewed strategies to aid in communication efficacy for the dysarthric speaker. Pt reports to reducing distractions and having 1 on 1 conversations as beneficial.   05-04-22: SLP educated on diet recommendations since pt was placed on alternative diet while recently admitted to hospital. Education provided on swallowing strategies and compensations to aid in continued ability to enjoy preferred  foods at home. Handout provided, pt verbalizes understanding, denies questions regarding recommendations. Target employment of dysarthria strategies- syllabification, increased intensity, reduced rate- at generative sentence level with 3 syllable words containing consonant blends. At single word, reading, level 90% accuracy initial attempt. 70% accuracy at generative sentence level initial attempt with occasional min-A. Intermittent ability to self-monitor for accuracy of production with pt able to self correct x4. Pt read 2 paragraph reading with many /sk/ blends, demonstrating carryover of reduced rate and over articulation with mod-I. Rare self-correction evidences pt with awareness of production during this exercise. Averages 71 dB during paragraph reading.   04-27-22: Pt reports to improved communication across variety of communication partners. In opening conversation of 10 minutes, pt evidences reduced rate and syllabification with rare need for request for repetition. SLP provided education and modeling of dysarthria strategies of slowed rate and over articulation. Following, pt provided with oral reading passage. Achieved 70% intelligibility initial attempt. With re-education and coaching on tricky words, pt able to achieve 90% accuracy on second attempt of same stimuli Novel stimuli, pt employs strategies and achieves 88% intelligibility on initial attempt. Education on topic maintenance or context to  aid in communication efficacy.   04-20-22: SLP provided additional education on dysarthria strategies to aid in overall intelligibility with focus this date on slowed rate, overarticulation, and increased vocal intensity. Given tri-syllabic words containing /s/ in all positions, pt able to produce at word level 85% accuracy on intial attempt; produce in generative sentence 75% accuracy. SLP provides occasional mod-A for use of strategies with occasional requirement for direct model. Pt demonstrates  spontaneous self correction x4 over course of structured practice. At conclusion of session, pt ID "break up my words" as strategy which A in improved intelligibility.    04-13-22: SLP provided education on evaluation observations and recommendations for therapy course. Initiated training re: dysarthria compensations and strategies to aid in improved communication efficacy. Collaborated with pt to establish POC and goals. Pt in agreement with POC and denies questions at conclusion of evaluation.    PATIENT EDUCATION: Education details: see above Person educated: Patient and sister Education method: Customer service manager Education comprehension: verbalized understanding, returned demonstration, and needs further education   GOALS: Goals reviewed with patient? Yes  SHORT TERM GOALS: Target date: 05/05/2022  Pt will intelligibly produce multi-syllabic words containing blends at generative sentence level in 18/20 trials with rare min-A Baseline: 05/04/22 Goal status: PARTIALLY MET  2.  Pt will teach back strategies and compensations to aid in communication efficacy with various communication partners  Baseline: 04/27/22 Goal status: MET  3.  Pt will maintain 68+ dB average during 5 minute conversation with occasional mod-A over 2 sessions Baseline: 05/04/22 Goal status: MET  4.  Pt will read 1 paragraph with use of compensations PRN, demonstrating carryover of dysarthria strategies with rare min-A Baseline: 05/04/22 Goal status: MET   LONG TERM GOALS: Target date: 07/07/2022  Pt will report BID dysarthria home practice 5/7 days over 1 week period with mod-I Baseline:  Goal status: INITIAL  2.  Pt will maintain 68+ dB average over 20 minute conversation with occasional min-A Baseline:  Goal status: INITIAL  3.  Pt will complete mult-paragraph oral reading, demonstrating carryover of dysarthria strategies with rare min-A Baseline:  Goal status: INITIAL  4.  Pt will report  improve subjective perception of speech efficacy via patient reported outcome measure by d/c  Baseline: 21 Goal status: INITIAL   ASSESSMENT:  CLINICAL IMPRESSION: Patient is a 71 y.o. 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 Session focused on employment of dysarthria compensations in progressively difficult speech tasks to aid in generalization to conversational speech. Increased accuracy at sentence generation level of 3 syllable words with blends. Pt evidencing emerging skill in self-monitoring and correction. Continue per POC.   OBJECTIVE IMPAIRMENTS: include dysarthria. These impairments are limiting patient from effectively communicating at home and in community. Factors affecting potential to achieve goals and functional outcome are severity of impairments. Patient will benefit from skilled SLP services to address above impairments and improve overall function.  REHAB POTENTIAL: Good  PLAN:  SLP FREQUENCY: 1x/week  SLP DURATION: 12 weeks  PLANNED INTERVENTIONS: Language facilitation, Cueing hierachy, Internal/external aids, Functional tasks, Multimodal communication approach, SLP instruction and feedback, Compensatory strategies, and Patient/family education    Su Monks, CCC-SLP 05/20/2022, 11:07 AM

## 2022-05-22 ENCOUNTER — Encounter: Payer: Self-pay | Admitting: Family Medicine

## 2022-05-22 ENCOUNTER — Ambulatory Visit (HOSPITAL_COMMUNITY)
Admission: RE | Admit: 2022-05-22 | Discharge: 2022-05-22 | Disposition: A | Discharge status: Home / Self Care | Payer: Medicare Other | Source: Ambulatory Visit | Attending: Vascular Surgery | Admitting: Vascular Surgery

## 2022-05-22 ENCOUNTER — Ambulatory Visit (INDEPENDENT_AMBULATORY_CARE_PROVIDER_SITE_OTHER): Payer: Medicare Other | Admitting: Family Medicine

## 2022-05-22 ENCOUNTER — Ambulatory Visit (INDEPENDENT_AMBULATORY_CARE_PROVIDER_SITE_OTHER)
Admission: RE | Admit: 2022-05-22 | Discharge: 2022-05-22 | Disposition: A | Discharge status: Home / Self Care | Payer: Medicare Other | Source: Ambulatory Visit | Attending: Vascular Surgery | Admitting: Vascular Surgery

## 2022-05-22 ENCOUNTER — Encounter: Payer: Self-pay | Admitting: Vascular Surgery

## 2022-05-22 ENCOUNTER — Ambulatory Visit (INDEPENDENT_AMBULATORY_CARE_PROVIDER_SITE_OTHER): Payer: Medicare Other | Admitting: Vascular Surgery

## 2022-05-22 VITALS — BP 159/84 | HR 72 | Temp 97.7°F | Resp 20 | Ht 63.0 in | Wt 106.0 lb

## 2022-05-22 VITALS — BP 144/79 | HR 77 | Temp 98.1°F | Resp 16 | Wt 106.0 lb

## 2022-05-22 DIAGNOSIS — I10 Essential (primary) hypertension: Secondary | ICD-10-CM

## 2022-05-22 DIAGNOSIS — N186 End stage renal disease: Secondary | ICD-10-CM

## 2022-05-22 DIAGNOSIS — Z992 Dependence on renal dialysis: Secondary | ICD-10-CM | POA: Insufficient documentation

## 2022-05-22 DIAGNOSIS — Z09 Encounter for follow-up examination after completed treatment for conditions other than malignant neoplasm: Secondary | ICD-10-CM

## 2022-05-22 NOTE — Progress Notes (Signed)
Established Patient Office Visit  Subjective    Patient ID: Monique Henderson, female    DOB: 02-Nov-1950  Age: 71 y.o. MRN: [id number]  CC:  Chief Complaint  Patient presents with   Follow-up    HFU    HPI Monique Henderson presents for follow up of recent hospitalization where she was diagnosed with elevated blood pressure and other dx 2/2 non-compliance with dialysis. Patient reports doing well since discharge.    Outpatient Encounter Medications as of 05/22/2022  Medication Sig   albuterol (VENTOLIN HFA) 108 (90 Base) MCG/ACT inhaler Inhale 2 puffs into the lungs every 6 (six) hours as needed for wheezing or shortness of breath.   amLODipine (NORVASC) 10 MG tablet Take 1 tablet (10 mg total) by mouth daily.   apixaban (ELIQUIS) 2.5 MG TABS tablet Take 1 tablet (2.5 mg total) by mouth 2 (two) times daily.   carvedilol (COREG) 25 MG tablet Take 1 tablet (25 mg total) by mouth 2 (two) times daily with a meal.   Cholecalciferol (VITAMIN D-3 PO) Take 1 capsule by mouth daily.   cloNIDine (CATAPRES - DOSED IN MG/24 HR) 0.1 mg/24hr patch Place 1 patch (0.1 mg total) onto the skin every Saturday.   diphenhydrAMINE-zinc acetate (BENADRYL) cream Apply topically 3 (three) times daily as needed for itching.   hydrALAZINE (APRESOLINE) 25 MG tablet Take 2 tablets (50 mg total) by mouth 2 (two) times daily.   isosorbide mononitrate (IMDUR) 30 MG 24 hr tablet Take 1 tablet (30 mg total) by mouth daily.   lidocaine (LIDODERM) 5 % Place 1 patch onto the skin daily. Remove & Discard patch within 12 hours or as directed by MD   naloxone (NARCAN) nasal spray 4 mg/0.1 mL 1 spray as directed.   oxyCODONE-acetaminophen (PERCOCET) 10-325 MG tablet Take 1 tablet by mouth 3 (three) times daily as needed.   pantoprazole (PROTONIX) 40 MG tablet Take 1 tablet (40 mg total) by mouth 2 (two) times daily.   rosuvastatin (CRESTOR) 10 MG tablet Take 1 tablet (10 mg total) by mouth daily.   sevelamer carbonate  (RENVELA) 2.4 g PACK Take 2.4 g by mouth 3 (three) times daily with meals.   traZODone (DESYREL) 50 MG tablet Take 0.5 tablets (25 mg total) by mouth at bedtime as needed for sleep.   No facility-administered encounter medications on file as of 05/22/2022.    Past Medical History:  Diagnosis Date   AF (paroxysmal atrial fibrillation) ([place]) 05/29/2019   on Coumadin   Aortic atherosclerosis ([place]) 07/05/2019   Aortic dissection ([place]) 04/04/2019   s/p repair   Bone spur 2008   Right calcaneal foot spur   Breast cancer ([place]) 2004   Ductal carcinoma in situ of the left breast; S/P left partial mastectomy 02/26/2003; S/P re-excision of cranial and lateral margins11/18/2004.radiation   Carotid artery disease ([facility])    Cerebral thrombosis with cerebral infarction 05/22/2019   Chronic HFrEF (heart failure with reduced ejection fraction) ([facility])    Chronic low back pain 06/22/2016   Chronic obstructive lung disease ([place]) 01/16/2017   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 Autumn Messing; S/P re-excision of superior and inferior margins 10/27/2012.    ESRD on hemodialysis ([place]) 05/29/2019   Essential hypertension 09/16/2006   GERD 09/16/2006   Hepatitis C    treated and RNA confirmed not detectable 01/2017   Insomnia 03/14/2015   Malnutrition of moderate degree 05/19/2019   Non  compliance with medical treatment 12/04/2017   Normocytic anemia    With thrombocytosis   Osteoarthritis    Right ureteral stone 2002   S/P lumbar spinal fusion 01/18/2014   S/P lumbar decompressive laminectomy, fusion, and plating for lumbar spinal stenosis on 05/27/2009 by Dr. Eustace Moore.  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 Eustace Moore.     Stroke (cerebrum)  ([place])    Tobacco use disorder 04/19/2009   Uterine fibroid    Wears dentures    top    Past Surgical History:  Procedure Laterality Date   ANTERIOR LAT LUMBAR FUSION N/A 01/18/2014   Procedure: ANTERIOR LATERAL LUMBAR FUSION LUMBAR TWO-THREE;  Surgeon: Eustace Moore, MD;  Location: [facility];  Service: Neurosurgery;  Laterality: N/A;   ANTERIOR LUMBAR FUSION  01/18/2014   AV FISTULA PLACEMENT Left 04/20/2019   Procedure: ARTERIOVENOUS (AV) FISTULA CREATION;  Surgeon: [name], MD;  Location: [facility];  Service: Vascular;  Laterality: Left;   BACK SURGERY     BREAST LUMPECTOMY Left 01/2003   BREAST LUMPECTOMY Right 2014   BREAST LUMPECTOMY WITH NEEDLE LOCALIZATION AND AXILLARY SENTINEL LYMPH NODE BX Right 10/13/2012   Procedure: BREAST LUMPECTOMY WITH NEEDLE LOCALIZATION;  Surgeon: [name], MD;  Location: [facility];  Service: General;  Laterality: Right;  Right breast wire localized lumpectomy   INSERTION OF DIALYSIS CATHETER Right 04/20/2019   Procedure: INSERTION OF DIALYSIS CATHETER, right internal jugular;  Surgeon: [name], MD;  Location: [facility];  Service: Vascular;  Laterality: Right;   INSERTION OF DIALYSIS CATHETER Right 09/24/2020   Procedure: INSERTION OF TUNNELED DIALYSIS CATHETER;  Surgeon: Rosetta Posner, MD;  Location: [facility];  Service: Vascular;  Laterality: Right;   IR FLUORO GUIDE CV LINE RIGHT  09/22/2020   IR THORACENTESIS ASP PLEURAL SPACE W/IMG GUIDE  05/19/2019   IR US GUIDE VASC ACCESS LEFT  09/22/2020   IR US GUIDE VASC ACCESS RIGHT  09/22/2020   IR VENOCAVAGRAM SVC  09/22/2020   LAMINECTOMY  05/27/2009   Lumbar decompressive laminectomy, fusion and plating for lumbar spinal stensosis   LIGATION OF ARTERIOVENOUS  FISTULA Left 09/24/2020   Procedure: LIGATION OF LEFT ARM ARTERIOVENOUS  FISTULA;  Surgeon: Rosetta Posner, MD;  Location: [facility];  Service: Vascular;  Laterality: Left;   LUMBAR LAMINECTOMY/DECOMPRESSION  MICRODISCECTOMY Left 03/23/2013   Procedure: LUMBAR LAMINECTOMY/DECOMPRESSION MICRODISCECTOMY 1 LEVEL;  Surgeon: Eustace Moore, MD;  Location: [facility];  Service: Neurosurgery;  Laterality: Left;  LUMBAR LAMINECTOMY/DECOMPRESSION MICRODISCECTOMY 1 LEVEL   MASTECTOMY, PARTIAL Left 02/26/2003   ; S/P re-excision of cranial and lateral margins 04/19/2003.    RE-EXCISION OF BREAST CANCER,SUPERIOR MARGINS Right 10/27/2012   Procedure: RE-EXCISION OF BREAST CANCER,SUPERIOR and inferior MARGINS;  Surgeon: [name], MD;  Location: [facility];  Service: General;  Laterality: Right;   RE-EXCISION OF BREAST LUMPECTOMY Left 04/2003   TEE WITHOUT CARDIOVERSION N/A 04/04/2019   Procedure: Transesophageal Echocardiogram ([facility]);  Surgeon: Wonda Olds, MD;  Location: [facility];  Service: Open Heart Surgery;  Laterality: N/A;   THORACIC AORTIC ANEURYSM REPAIR N/A 04/04/2019   Procedure: THORACIC ASCENDING ANEURYSM REPAIR (AAA)  USING 28 MM X 30 CM HEMASHIELD PLATINUM VASCULAR GRAFT;  Surgeon: Wonda Olds, MD;  Location: [facility];  Service: Open Heart Surgery;  Laterality: N/A;    Family History  Problem Relation Age of Onset   Colon cancer Mother 97   Hypertension Mother    Diabetes Sister 1   Hypertension Sister    Diabetes Brother    Hypertension Brother    Diabetes Brother    Hypertension Brother    Kidney disease Son        s/p renal transplant   Hypertension Son    Diabetes Son    Multiple sclerosis Son    Bone cancer Sister 24   Breast cancer Neg Hx    Cervical cancer Neg Hx     Social History   Socioeconomic History   Marital status: Single    Spouse name: Not on file   Number of children: 2   Years of education: Not on file   Highest education level: Not on file  Occupational History   Occupation: disabled  Tobacco Use   Smoking status: Former    Packs/day: 0.25    Years: 44.00    Total pack years: 11.00    Types: Cigarettes    Quit date: 2022    Years since quitting:  1.9   Smokeless tobacco: Never   Tobacco comments:    smoking less  Vaping Use   Vaping Use: Never used  Substance and Sexual Activity   Alcohol use: Not Currently    Alcohol/week: 2.0 standard drinks of alcohol    Types: 2 Cans of beer per week    Comment: "couple beers a weekend", not currently   Drug use: Not Currently    Types: Cocaine    Comment: last in 2005   Sexual activity: Yes    Birth control/protection: Post-menopausal  Other Topics Concern   Not on file  Social History Narrative   Not on file   Social Determinants of Health   Financial Resource Strain: Not on file  Food Insecurity: No Food Insecurity (04/30/2022)   Hunger Vital Sign    Worried About Running Out of Food in the Last Year: Never true    Ran Out of Food in the Last Year: Never true  Transportation Needs: No Transportation Needs (04/30/2022)   PRAPARE - Hydrologist (Medical): No    Lack of Transportation (Non-Medical): No  Physical Activity: Not on file  Stress: Not on file  Social Connections: Not on file  Intimate Partner Violence: Not At Risk (04/30/2022)   Humiliation, Afraid, Rape, and Kick questionnaire    Fear of Current or Ex-Partner: No    Emotionally Abused: No    Physically Abused: No    Sexually Abused: No    Review of Systems  All other systems reviewed and are negative.       Objective    BP (!) 144/79   Pulse 77   Temp 98.1 F (36.7 C) (Oral)   Resp 16   Wt 106 lb (48.1 kg)   SpO2 95%   BMI 18.78 kg/m   Physical Exam Vitals and nursing note reviewed.  Constitutional:      General: She is not in acute distress. Cardiovascular:     Rate and Rhythm: Normal rate and regular rhythm.  Pulmonary:     Effort: Pulmonary effort is normal.     Breath sounds: Normal breath sounds.  Abdominal:     Palpations: Abdomen is soft.     Tenderness: There is no abdominal tenderness.  Neurological:     General: No  focal deficit present.      Mental Status: She is alert and oriented to person, place, and time.         Assessment & Plan:   1. Essential hypertension Continue present management  2. ESRD (end stage renal disease) ([place]) Discussed compliance. Continue as per consultant  3. Hospital discharge follow-up Much improved since discharge    No follow-ups on file.   Becky Sax, MD

## 2022-05-22 NOTE — Progress Notes (Signed)
Office Note     CC: End-stage renal disease Requesting Provider:  Dorna Mai, MD  HPI: Monique Henderson is a 71 y.o. ([birth year]) female presenting at the request of .Dorna Mai, MD with end-stage renal disease to discuss new long-term HD access.  On exam today, Monique Henderson was doing well, accompanied by one of her daughters.  Monique Henderson has 13 children in all, 10 girls, 3 boys.  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  She is currently being dialyzed through a right-sided TDC. Fortunately, Monique Henderson recently had a stroke in September of this year affecting her right side.  She has had significant improvement and is able to move her right hand again.  She continues to have some speech issues, but per her and her daughter, is improving daily.  Monique Henderson made it clear she was not interested in pursuing long-term HD access via fistula or graft.  Her daughter had several questions which I was happy to answer.   Past Medical History:  Diagnosis Date   AF (paroxysmal atrial fibrillation) ([place]) 05/29/2019   on Coumadin   Aortic atherosclerosis ([place]) 07/05/2019   Aortic dissection ([place]) 04/04/2019   s/p repair   Bone spur 2008   Right calcaneal foot spur   Breast cancer ([place]) 2004   Ductal carcinoma in situ of the left breast; S/P left partial mastectomy 02/26/2003; S/P re-excision of cranial and lateral margins11/18/2004.radiation   Carotid artery disease (HCC)    Cerebral thrombosis with cerebral infarction 05/22/2019   Chronic HFrEF (heart failure with reduced ejection fraction) (HCC)    Chronic low back pain 06/22/2016   Chronic obstructive lung disease ([place]) 01/16/2017   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 Autumn Messing; S/P re-excision of superior and inferior margins 10/27/2012.    ESRD on hemodialysis  ([place]) 05/29/2019   Essential hypertension 09/16/2006   GERD 09/16/2006   Hepatitis C    treated and RNA confirmed not detectable 01/2017   Insomnia 03/14/2015   Malnutrition of moderate degree 05/19/2019   Non compliance with medical treatment 12/04/2017   Normocytic anemia    With thrombocytosis   Osteoarthritis    Right ureteral stone 2002   S/P lumbar spinal fusion 01/18/2014   S/P lumbar decompressive laminectomy, fusion, and plating for lumbar spinal stenosis on 05/27/2009 by Dr. Eustace Moore.  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 Eustace Moore.     Stroke (cerebrum) ([place])    Tobacco use disorder 04/19/2009   Uterine fibroid    Wears dentures    top    Past Surgical History:  Procedure Laterality Date   ANTERIOR LAT LUMBAR FUSION N/A 01/18/2014   Procedure: ANTERIOR LATERAL LUMBAR FUSION LUMBAR TWO-THREE;  Surgeon: Eustace Moore, MD;  Location: [facility];  Service: Neurosurgery;  Laterality: N/A;   ANTERIOR LUMBAR FUSION  01/18/2014   AV FISTULA PLACEMENT Left 04/20/2019   Procedure: ARTERIOVENOUS (AV) FISTULA CREATION;  Surgeon: [name], MD;  Location: [facility];  Service: Vascular;  Laterality: Left;   BACK SURGERY     BREAST LUMPECTOMY Left 01/2003   BREAST LUMPECTOMY Right 2014   BREAST LUMPECTOMY WITH NEEDLE LOCALIZATION AND AXILLARY SENTINEL LYMPH  NODE BX Right 10/13/2012   Procedure: BREAST LUMPECTOMY WITH NEEDLE LOCALIZATION;  Surgeon: [name], MD;  Location: [facility];  Service: General;  Laterality: Right;  Right breast wire localized lumpectomy   INSERTION OF DIALYSIS CATHETER Right 04/20/2019   Procedure: INSERTION OF DIALYSIS CATHETER, right internal jugular;  Surgeon: [name], MD;  Location: [facility];  Service:  Vascular;  Laterality: Right;   INSERTION OF DIALYSIS CATHETER Right 09/24/2020   Procedure: INSERTION OF TUNNELED DIALYSIS CATHETER;  Surgeon: Rosetta Posner, MD;  Location: [facility];  Service: Vascular;  Laterality: Right;   IR FLUORO GUIDE CV LINE RIGHT  09/22/2020   IR THORACENTESIS ASP PLEURAL SPACE W/IMG GUIDE  05/19/2019   IR US GUIDE VASC ACCESS LEFT  09/22/2020   IR US GUIDE VASC ACCESS RIGHT  09/22/2020   IR VENOCAVAGRAM SVC  09/22/2020   LAMINECTOMY  05/27/2009   Lumbar decompressive laminectomy, fusion and plating for lumbar spinal stensosis   LIGATION OF ARTERIOVENOUS  FISTULA Left 09/24/2020   Procedure: LIGATION OF LEFT ARM ARTERIOVENOUS  FISTULA;  Surgeon: Rosetta Posner, MD;  Location: [facility];  Service: Vascular;  Laterality: Left;   LUMBAR LAMINECTOMY/DECOMPRESSION MICRODISCECTOMY Left 03/23/2013   Procedure: LUMBAR LAMINECTOMY/DECOMPRESSION MICRODISCECTOMY 1 LEVEL;  Surgeon: Eustace Moore, MD;  Location: [facility];  Service: Neurosurgery;  Laterality: Left;  LUMBAR LAMINECTOMY/DECOMPRESSION MICRODISCECTOMY 1 LEVEL   MASTECTOMY, PARTIAL Left 02/26/2003   ; S/P re-excision of cranial and lateral margins 04/19/2003.    RE-EXCISION OF BREAST CANCER,SUPERIOR MARGINS Right 10/27/2012   Procedure: RE-EXCISION OF BREAST CANCER,SUPERIOR and inferior MARGINS;  Surgeon: [name], MD;  Location: [facility];  Service: General;  Laterality: Right;   RE-EXCISION OF BREAST LUMPECTOMY Left 04/2003   TEE WITHOUT CARDIOVERSION N/A 04/04/2019   Procedure: Transesophageal Echocardiogram (Tee);  Surgeon: Wonda Olds, MD;  Location: [facility];  Service: Open Heart Surgery;  Laterality: N/A;   THORACIC AORTIC ANEURYSM REPAIR N/A 04/04/2019   Procedure: THORACIC ASCENDING ANEURYSM REPAIR (AAA)  USING 28 MM X 30 CM HEMASHIELD PLATINUM VASCULAR GRAFT;  Surgeon: Wonda Olds, MD;  Location: [facility];  Service: Open Heart Surgery;  Laterality: N/A;    Social History   Socioeconomic History   Marital  status: Single    Spouse name: Not on file   Number of children: 2   Years of education: Not on file   Highest education level: Not on file  Occupational History   Occupation: disabled  Tobacco Use   Smoking status: Former    Packs/day: 0.25    Years: 44.00    Total pack years: 11.00    Types: Cigarettes    Quit date: 2022    Years since quitting: 1.9   Smokeless tobacco: Never   Tobacco comments:    smoking less  Vaping Use   Vaping Use: Never used  Substance and Sexual Activity   Alcohol use: Not Currently    Alcohol/week: 2.0 standard drinks of alcohol    Types: 2 Cans of beer per week    Comment: "couple beers a weekend", not currently   Drug use: Not Currently    Types: Cocaine    Comment: last in 2005   Sexual activity: Yes    Birth control/protection: Post-menopausal  Other Topics Concern   Not on file  Social History Narrative   Not on file   Social Determinants of Health   Financial Resource Strain: Not on file  Food Insecurity: No Food Insecurity (04/30/2022)   Hunger Vital Sign    Worried About Running Out of Food in the Last Year: Never true    Ran Out of Food in the Last Year: Never true  Transportation Needs: No Transportation Needs (04/30/2022)   PRAPARE - Hydrologist (Medical): No    Lack of Transportation (Non-Medical): No  Physical Activity: Not on file  Stress: Not on file  Social Connections: Not on file  Intimate Partner Violence: Not At Risk (04/30/2022)   Humiliation, Afraid, Rape, and Kick questionnaire    Fear of Current or Ex-Partner: No    Emotionally Abused: No    Physically Abused: No    Sexually Abused: No   Family History  Problem Relation Age of Onset   Colon cancer Mother 37   Hypertension Mother    Diabetes Sister 59   Hypertension Sister    Diabetes Brother    Hypertension Brother    Diabetes Brother    Hypertension Brother    Kidney disease Son        s/p renal transplant    Hypertension Son    Diabetes Son    Multiple sclerosis Son    Bone cancer Sister 45   Breast cancer Neg Hx    Cervical cancer Neg Hx     Current Outpatient Medications  Medication Sig Dispense Refill   albuterol (VENTOLIN HFA) 108 (90 Base) MCG/ACT inhaler Inhale 2 puffs into the lungs every 6 (six) hours as needed for wheezing or shortness of breath. 8.5 g 0   amLODipine (NORVASC) 10 MG tablet Take 1 tablet (10 mg total) by mouth daily. 30 tablet 0   apixaban (ELIQUIS) 2.5 MG TABS tablet Take 1 tablet (2.5 mg total) by mouth 2 (two) times daily. 60 tablet 0   carvedilol (COREG) 25 MG tablet Take 1 tablet (25 mg total) by mouth 2 (two) times daily with a meal. 60 tablet 1   Cholecalciferol (VITAMIN D-3 PO) Take 1 capsule by mouth daily.     cloNIDine (CATAPRES - DOSED IN MG/24 HR) 0.1 mg/24hr patch Place 1 patch (0.1 mg total) onto the skin every Saturday. 4 patch 0   diphenhydrAMINE-zinc acetate (BENADRYL) cream Apply topically 3 (three) times daily as needed for itching. 28.4 g 0   hydrALAZINE (APRESOLINE) 25 MG tablet Take 2 tablets (50 mg total) by mouth 2 (two) times daily. 120 tablet 1   isosorbide mononitrate (IMDUR) 30 MG 24 hr tablet Take 1 tablet (30 mg total) by mouth daily. 90 tablet 1   lidocaine (LIDODERM) 5 % Place 1 patch onto the skin daily. Remove & Discard patch within 12 hours or as directed by MD 30 patch 0   naloxone (NARCAN) nasal spray 4 mg/0.1 mL 1 spray as directed.     oxyCODONE-acetaminophen (PERCOCET) 10-325 MG tablet Take 1 tablet by mouth 3 (three) times daily as needed.     pantoprazole (PROTONIX) 40 MG tablet Take 1 tablet (40 mg total) by mouth 2 (two) times daily. 60 tablet 1   rosuvastatin (CRESTOR) 10 MG tablet Take 1 tablet (10 mg total) by mouth daily. 90 tablet 0   sevelamer carbonate (RENVELA) 2.4 g PACK Take 2.4 g by mouth 3 (three) times daily with meals. 90 each 0   traZODone (DESYREL) 50 MG tablet Take 0.5 tablets (25 mg total) by mouth at bedtime  as needed for sleep. 45 tablet 0   No current facility-administered medications for  this visit.    Allergies  Allergen Reactions   Shrimp [Shellfish Allergy] Shortness Of Breath   Bactroban [Mupirocin] Other (See Comments)    "Sores in nose"   Hydrocodone Itching, Nausea Only and Nausea And Vomiting   Chlorhexidine Gluconate Itching   Tylenol [Acetaminophen] Itching   Zestril [Lisinopril] Cough     REVIEW OF SYSTEMS:  '[X]'$  denotes positive finding, '[ ]'$  denotes negative finding Cardiac  Comments:  Chest pain or chest pressure:    Shortness of breath upon exertion:    Short of breath when lying flat:    Irregular heart rhythm:        Vascular    Pain in calf, thigh, or hip brought on by ambulation:    Pain in feet at night that wakes you up from your sleep:     Blood clot in your veins:    Leg swelling:         Pulmonary    Oxygen at home:    Productive cough:     Wheezing:         Neurologic    Sudden weakness in arms or legs:     Sudden numbness in arms or legs:     Sudden onset of difficulty speaking or slurred speech:    Temporary loss of vision in one eye:     Problems with dizziness:         Gastrointestinal    Blood in stool:     Vomited blood:         Genitourinary    Burning when urinating:     Blood in urine:        Psychiatric    Major depression:         Hematologic    Bleeding problems:    Problems with blood clotting too easily:        Skin    Rashes or ulcers:        Constitutional    Fever or chills:      PHYSICAL EXAMINATION:  Vitals:   05/22/22 1241  BP: (!) 159/84  Pulse: 72  Resp: 20  Temp: 97.7 F (36.5 C)  SpO2: 97%  Weight: 106 lb (48.1 kg)  Height: '5\' 3"'$  (1.6 m)    General:  WDWN in NAD; vital signs documented above Gait: Not observed HENT: WNL, normocephalic Pulmonary: normal non-labored breathing , without wheezing Cardiac: regular HR Abdomen: soft, NT, no masses Skin: without rashes Vascular Exam/Pulses:   Right Left  Radial 2+ (normal) 2+ (normal)  Ulnar 1+ (weak) 1+ (weak)                   Extremities: without ischemic changes, without Gangrene , without cellulitis; without open wounds;  Right upper extremity edema with edema glove in place.  Right-sided weakness as compared to left. Musculoskeletal: no muscle wasting or atrophy  Neurologic: A&O X 3;   Psychiatric:  The pt has Normal affect.   Non-Invasive Vascular Imaging:   +-----------------+-------------+----------+--------------+  Right Cephalic   Diameter (cm)Depth (cm)   Findings     +-----------------+-------------+----------+--------------+  Shoulder            0.12                               +-----------------+-------------+----------+--------------+  Prox upper arm       0.13                               +-----------------+-------------+----------+--------------+  Mid upper arm                           not visualized  +-----------------+-------------+----------+--------------+  Dist upper arm                          not visualized  +-----------------+-------------+----------+--------------+  Antecubital fossa    0.16                               +-----------------+-------------+----------+--------------+  Prox forearm         0.08                               +-----------------+-------------+----------+--------------+  Mid forearm                             not visualized  +-----------------+-------------+----------+--------------+  Dist forearm                            not visualized  +-----------------+-------------+----------+--------------+  Wrist                                  not visualized  +-----------------+-------------+----------+--------------+   +-----------------+-------------+----------+--------------+  Right Basilic    Diameter (cm)Depth (cm)   Findings     +-----------------+-------------+----------+--------------+  Shoulder                                not visualized  +-----------------+-------------+----------+--------------+  Prox upper arm       0.11                               +-----------------+-------------+----------+--------------+  Mid upper arm                           not visualized  +-----------------+-------------+----------+--------------+  Dist upper arm       0.19                               +-----------------+-------------+----------+--------------+  Antecubital fossa    0.13                               +-----------------+-------------+----------+--------------+  Prox forearm                            not visualized  +-----------------+-------------+----------+--------------+   +-----------------+-------------+----------+------------------------------+   Left Cephalic    Diameter (cm)Depth (cm)           Findings              +-----------------+-------------+----------+------------------------------+   Prox upper arm                          not visualized and ligated  AVF  +-----------------+-------------+----------+------------------------------+   Mid upper arm  not visualized           +-----------------+-------------+----------+------------------------------+   Dist upper arm                                  not visualized           +-----------------+-------------+----------+------------------------------+   Antecubital fossa                               not visualized           +-----------------+-------------+----------+------------------------------+   Prox forearm         0.11                       not visualized           +-----------------+-------------+----------+------------------------------+   Mid forearm          0.12                                                +-----------------+-------------+----------+------------------------------+   Dist forearm                                       too small              +-----------------+-------------+----------+------------------------------+    +-----------------+-------------+----------+--------------+  Left Basilic     Diameter (cm)Depth (cm)   Findings     +-----------------+-------------+----------+--------------+  Shoulder                               not visualized  +-----------------+-------------+----------+--------------+  Prox upper arm       0.11                               +-----------------+-------------+----------+--------------+  Mid upper arm        0.18                               +-----------------+-------------+----------+--------------+  Dist upper arm       0.19                               +-----------------+-------------+----------+--------------+  Antecubital fossa    0.14                               +-----------------+-------------+----------+--------------+  Prox forearm                            not visualized  +-----------------+-------------+----------+--------------+     ASSESSMENT/PLAN: Monique Henderson is a 71 y.o. female presenting with end-stage renal disease and need for long-term HD access.  Artemisia made it clear that she was not interested in pursuing AV graft or AV fistula at this time.  In evaluating her vein mapping, she is not a candidate for AV fistula and would require a graft.  Being that she has a left-sided innominate vein occlusion, only upper extremity access option would be right-sided AV graft.  I had an in-depth discussion with her and her daughter about the importance of AV graft versus continued tunneled dialysis catheter.  Namely, the decreased risk of infection and durability.  Tova stated she would think about this further, but was not interested in any procedures at this time.  I am happy to assist should she change her mind.  Broadus John, MD [facility] [phone]

## 2022-05-22 NOTE — Progress Notes (Signed)
Patient is here for f/u hospital. Patient is also here for BP f/u

## 2022-05-27 ENCOUNTER — Ambulatory Visit: Payer: Medicare Other | Admitting: Physical Therapy

## 2022-05-27 NOTE — Telephone Encounter (Signed)
Louie Casa, RN 05/26/2022  8:47 AM EST Back to Top    Drexel the patient's sister and informed her of the results. Enid Derry stated that she had already had a follow up appointment with the patient's PCP and she would watch the patient for any signs of bleeding.

## 2022-05-29 ENCOUNTER — Other Ambulatory Visit: Payer: Medicare Other

## 2022-05-29 ENCOUNTER — Ambulatory Visit: Payer: Medicare Other | Admitting: Speech Pathology

## 2022-05-29 DIAGNOSIS — R471 Dysarthria and anarthria: Secondary | ICD-10-CM

## 2022-05-29 DIAGNOSIS — R41841 Cognitive communication deficit: Secondary | ICD-10-CM

## 2022-05-29 DIAGNOSIS — M6281 Muscle weakness (generalized): Secondary | ICD-10-CM | POA: Diagnosis not present

## 2022-05-29 NOTE — Patient Instructions (Signed)
Strategies to make communication effective:  Don't complete with background noise or other distractions Turn off the tv, the radio, sit in the back corner at a restaurant  Consider how many people you're talking to at once You said you do better in one-on-one conversations or small groups of particular people Even at a party, you can orchestrate one on one conversation, go to a quiet spot and have a personal conversation How many people you can talk to at once will depend on who you're talking to It can helpful to face your speaker  Consider facetime instead of telephone  Plan a time to meet and catch up instead of talking on the phone Remember the things you can do to be successful Tell people to give you time Come up with hand signal to relay you're getting interrupted Try and stay calm-- you said you do better when you're not worked up  Consider when it's worth the extra effort Be a BOSS with your speech Breathe  Over-articulate (break up longer words, say all the sounds)  Speak up! Slow down!

## 2022-05-29 NOTE — Therapy (Signed)
OUTPATIENT OCCUPATIONAL THERAPY TREATMENT & DISCHARGE NOTE  Patient Name: Monique Henderson MRN: [id number] DOB:06-10-1950, 71 y.o., female Today's Date: 06/03/2022  PCP: Georganna Skeans, MD REFERRING PROVIDER: Jacquelynn Cree, PA-C      OT End of Session - 06/03/22 1320     Visit Number 10    Number of Visits 16    Date for OT Re-Evaluation 06/12/22    Authorization Type [employer]    Progress Note Due on Visit 18    OT Start Time 1321    OT Stop Time 1347    OT Time Calculation (min) 26 min    Activity Tolerance Patient limited by fatigue;Patient limited by pain;Patient limited by lethargy    Behavior During Therapy [facility] for tasks assessed/performed               Past Medical History:  Diagnosis Date   AF (paroxysmal atrial fibrillation) (HCC) 05/29/2019   on Coumadin   Aortic atherosclerosis (HCC) 07/05/2019   Aortic dissection (HCC) 04/04/2019   s/p repair   Bone spur 2008   Right calcaneal foot spur   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 Chevis Pretty; S/P re-excision of superior and inferior margins 10/27/2012.    ESRD on hemodialysis (HCC) 05/29/2019   Essential hypertension 09/16/2006   GERD 09/16/2006   Hepatitis C    treated and RNA confirmed not detectable 01/2017   Insomnia 03/14/2015   Malnutrition of moderate degree 05/19/2019   Non compliance with medical treatment 12/04/2017   Normocytic anemia    With thrombocytosis   Osteoarthritis    Right ureteral stone 2002    S/P lumbar spinal fusion 01/18/2014   S/P lumbar decompressive laminectomy, fusion, and plating for lumbar spinal stenosis on 05/27/2009 by Dr. Tia Alert.  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 Tia Alert.     Stroke (cerebrum) (HCC)    Tobacco use disorder 04/19/2009   Uterine fibroid    Wears dentures    top   Past Surgical History:  Procedure Laterality Date   ANTERIOR LAT LUMBAR FUSION N/A 01/18/2014   Procedure: ANTERIOR LATERAL LUMBAR FUSION LUMBAR TWO-THREE;  Surgeon: Tia Alert, MD;  Location: [facility];  Service: Neurosurgery;  Laterality: N/A;   ANTERIOR LUMBAR FUSION  01/18/2014   AV FISTULA PLACEMENT Left 04/20/2019   Procedure: ARTERIOVENOUS (AV) FISTULA CREATION;  Surgeon: Maeola Harman, MD;  Location: [facility] OR;  Service: Vascular;  Laterality: Left;   BACK SURGERY     BREAST LUMPECTOMY Left 01/2003   BREAST LUMPECTOMY Right 2014   BREAST LUMPECTOMY WITH NEEDLE LOCALIZATION AND AXILLARY SENTINEL LYMPH NODE BX Right 10/13/2012   Procedure: BREAST LUMPECTOMY WITH NEEDLE LOCALIZATION;  Surgeon: Robyne Askew, MD;  Location: [facility];  Service: General;  Laterality: Right;  Right breast wire localized lumpectomy   INSERTION  OF DIALYSIS CATHETER Right 04/20/2019   Procedure: INSERTION OF DIALYSIS CATHETER, right internal jugular;  Surgeon: Maeola Harman, MD;  Location: [facility] OR;  Service: Vascular;  Laterality: Right;   INSERTION OF DIALYSIS CATHETER Right 09/24/2020   Procedure: INSERTION OF TUNNELED DIALYSIS CATHETER;  Surgeon: Larina Earthly, MD;  Location: [facility] OR;  Service: Vascular;  Laterality: Right;   IR FLUORO GUIDE CV LINE RIGHT  09/22/2020   IR THORACENTESIS ASP PLEURAL SPACE W/IMG GUIDE  05/19/2019   IR US GUIDE VASC ACCESS LEFT   09/22/2020   IR US GUIDE VASC ACCESS RIGHT  09/22/2020   IR VENOCAVAGRAM SVC  09/22/2020   LAMINECTOMY  05/27/2009   Lumbar decompressive laminectomy, fusion and plating for lumbar spinal stensosis   LIGATION OF ARTERIOVENOUS  FISTULA Left 09/24/2020   Procedure: LIGATION OF LEFT ARM ARTERIOVENOUS  FISTULA;  Surgeon: Larina Earthly, MD;  Location: [facility] OR;  Service: Vascular;  Laterality: Left;   LUMBAR LAMINECTOMY/DECOMPRESSION MICRODISCECTOMY Left 03/23/2013   Procedure: LUMBAR LAMINECTOMY/DECOMPRESSION MICRODISCECTOMY 1 LEVEL;  Surgeon: Tia Alert, MD;  Location: [facility];  Service: Neurosurgery;  Laterality: Left;  LUMBAR LAMINECTOMY/DECOMPRESSION MICRODISCECTOMY 1 LEVEL   MASTECTOMY, PARTIAL Left 02/26/2003   ; S/P re-excision of cranial and lateral margins 04/19/2003.    RE-EXCISION OF BREAST CANCER,SUPERIOR MARGINS Right 10/27/2012   Procedure: RE-EXCISION OF BREAST CANCER,SUPERIOR and inferior MARGINS;  Surgeon: Robyne Askew, MD;  Location: [facility] OR;  Service: General;  Laterality: Right;   RE-EXCISION OF BREAST LUMPECTOMY Left 04/2003   TEE WITHOUT CARDIOVERSION N/A 04/04/2019   Procedure: Transesophageal Echocardiogram (Tee);  Surgeon: Linden Dolin, MD;  Location: [facility] OR;  Service: Open Heart Surgery;  Laterality: N/A;   THORACIC AORTIC ANEURYSM REPAIR N/A 04/04/2019   Procedure: THORACIC ASCENDING ANEURYSM REPAIR (AAA)  USING 28 MM X 30 CM HEMASHIELD PLATINUM VASCULAR GRAFT;  Surgeon: Linden Dolin, MD;  Location: [facility] OR;  Service: Open Heart Surgery;  Laterality: N/A;   Patient Active Problem List   Diagnosis Date Noted   Right hemiparesis (HCC) 04/06/2022   Acute cardioembolic stroke (HCC) 02/26/2022   Stroke (cerebrum) (HCC) 02/23/2022   Stroke (HCC) 02/23/2022   Hypervolemia associated with renal insufficiency 01/29/2022   Dyslipidemia 01/29/2022   DNR (do not resuscitate) 01/29/2022   Polysubstance abuse (HCC) 01/06/2022   Acute on chronic combined systolic and  diastolic CHF (congestive heart failure) (HCC) 01/06/2022   Abdominal pain 01/06/2022   Acute metabolic encephalopathy 12/24/2021   Malignant HTN with heart disease, w/o CHF, 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 12/24/2021   History of hepatitis C 12/08/2021   Hypertensive urgency 10/29/2021   Fluid overload 10/29/2021   Acute on chronic HFrEF (heart failure with reduced ejection fraction) (HCC) 10/29/2021   Chronic anticoagulation 10/29/2021   Prolonged QT interval 10/29/2021   Leukopenia 10/29/2021   SOB (shortness of breath) 09/15/2021   Hypertensive emergency 08/23/2021   Altered mental status    ESRD (end stage renal disease) on dialysis with hyperkalemia and metabolic acidosis with elevated AG  08/12/2021   Malignant hypertension 06/19/2021   Anemia 06/05/2021   Chronic diastolic congestive heart failure (HCC) 04/30/2021   Pure hypercholesterolemia 02/14/2021   Acute pulmonary edema (HCC) 12/23/2020   Acute respiratory failure with hypoxia (HCC) 09/21/2020   Acute renal failure superimposed on chronic kidney disease (HCC) 09/21/2020   Community acquired pneumonia 09/21/2020   Uremia 09/21/2020   Metabolic acidosis with increased anion gap and accumulation of organic acids 09/21/2020  Hypertensive crisis 09/21/2020   Troponin level elevated 08/28/2019   Positive D dimer 08/28/2019   Hyperkalemia 08/28/2019   SVT (supraventricular tachycardia) 08/28/2019   SIRS (systemic inflammatory response syndrome) (HCC) 08/27/2019   Paroxysmal SVT (supraventricular tachycardia) 08/27/2019   Aortic atherosclerosis (HCC) 07/05/2019   Chest pain 07/05/2019   History of CVA (cerebrovascular accident) 05/29/2019   AF (paroxysmal atrial fibrillation) (HCC) 05/29/2019   ESRD on dialysis (HCC) 05/29/2019   Cerebral thrombosis with cerebral infarction 05/22/2019   Malnutrition of moderate degree 05/19/2019   Pleural effusion 05/18/2019   Atrial fibrillation with RVR (HCC) 05/18/2019   S/P aortic  aneurysm repair 04/07/2019   Aortic dissection (HCC) 04/04/2019   Dissection of aorta (HCC) 04/03/2019   Non compliance with medical treatment 12/04/2017   Chronic obstructive lung disease (HCC) 01/16/2017   Chronic low back pain 06/22/2016   Insomnia 03/14/2015   S/P lumbar spinal fusion 01/18/2014   Tobacco use disorder 04/19/2009   GERD 09/16/2006    ONSET DATE:  DOI: 02/22/22 (CVA)  REFERRING DIAG: I63.9 (ICD-10-CM) - Cerebral infarction, unspecified  THERAPY DIAG:  Localized edema  Other lack of coordination  Stiffness of right hand, not elsewhere classified  Muscle weakness (generalized)  Stiffness of right wrist, not elsewhere classified  Rationale for Evaluation and Treatment: Rehabilitation  PERTINENT HISTORY: She suffered a stroke on 02/22/22 to her Lt posterior-frontal parietal and anterior-parietal areas causing dysphagia, right hemiplegia, speech issues, and cognitive problems. She spent ~2 weeks in the hospital, in inpatient rehab. At discharge from inpatient rehab, she was noted to have: no right handed grip, 75% intelligible speech, SBA with no device for transfers, dysphagia, HTN, HLD, tachycardia, A-fib, CKD, and chest pain (other than cardiac).   PRECAUTIONS: Fall and Other: Rt hemiplegia, speech, swallow and cognitive issues  WEIGHT BEARING RESTRICTIONS: No   SUBJECTIVE:   SUBJECTIVE STATEMENT: Arrives almost 20 minutes late for session today, stating increased pain increased stiffness and swelling throughout right arm and shoulder and elbow now as well.  Her hand is very swollen and she is not wearing her edema glove.   PAIN:  Are you having pain? Yes Rating: 3-4/10 at rest now in Rt elbow, shoulder   PATIENT GOALS: To get right dom arm moving better.    OBJECTIVE: (All objective assessments below are from initial evaluation on: 04/01/22 unless otherwise specified.)    HAND DOMINANCE: Right   ADLs: Overall ADLs: 06/03/22- worsening pain/problems  now    FUNCTIONAL OUTCOME MEASURES: 05/13/22: Quick DASH 50% impairment today   Eval: Quck DASH 38.6% impairment today  (Higher % Score  =  More Impairment)     UPPER EXTREMITY ROM     Shoulder to Wrist AROM Right eval Rt  04/08/22 Rt 05/13/22 Rt 06/03/22 Approximate measures today  Shoulder flexion 108* in scaption 118* in scaption 90* in scaption 45*  Shoulder abduction 128* 107* 92 35*  Shoulder extension 68  45   Shoulder internal rotation   Belly touch   Shoulder external rotation   Reaches to rear hip   Elbow flexion 130 138* 118 90*  Elbow extension (-10) (-10) (-10) (-10)   Forearm supination 65 51* 57   Forearm pronation  65 60* 85*   Wrist flexion 15  30 (PROM=67*)   Wrist extension 4  (-10) (PROM= 40*)    Wrist ulnar deviation 19  10   Wrist radial deviation 5  3   (Blank rows = not tested)  Eval: She has some (~10-15*)  total motion in composite finger flexion and extension today with prolonged squeezes. Thumb motion is nil.   Hand AROM Right eval Rt 05/14/23  Full Fist Ability (or Gap to Distal Palmar Crease) 5.5cm gap from MF tip to [facility] ~9cm gap (no significant MF motion today)  Thumb Opposition to Small Finger (or Gap) unable Unable  Thumb Opposition to Base of Small Finger (or Gap)  unable Unable  (Blank rows = not tested)   UPPER EXTREMITY MMT:    06/03/22: Mobility and generally strength is much worse today throughout whole right arm.  She also states painful when trying to move and at rest.  05/13/22: Rt proximal arm grossly 3-/5 MMT today, and distal arm/hand grossly between 1/5 and 2-/5 MMT today    HAND FUNCTION: 06/03/22: totally unable, increased swelling today  COORDINATION: 06/03/22: unable to use hand functionally today, even upper arm now with worsening GMS, ability  EDEMA:   06/03/22: 46.2cm figure of 8 around Rt hand (much worse again, a day after dialysis)   05/13/22: 41cm figure of 8 around Rt hand to start- much better with edema  glove  04/13/22: 45cm figure of 8 around Rt hand to start   COGNITION: 05/13/22: Seems somewhat worse today- sleepy, nodding off again, word finding seems difficult and slurring words today.   Eval: Overall cognitive status: overtly seemed [facility] for evaluation today, though perhaps a bit impulsive.  She is working with SLP for this and speech   OBSERVATIONS:   06/03/22: Her hand is more swollen than ever, swelling now extends up to her elbow and into her shoulder somewhat with pitting edema tenderness palpation tenderness and pain with motion.  She denies further injury, though did state a fall about a month ago.  Her swelling is always worse after hemodialysis, and OT believes it is weighing her arm down and further complicating her rehabilitation.  Her use and function with right arm is much worse now than when she began therapy unfortunately, as OT has been observing and noting in past sessions.  TODAY'S TREATMENT:  06/03/22: Due to her very low arrival significant treatment is unable to be accomplished today other than OT advising her to continue her home exercise program every day is much as possible.  She states the pain inhibits her.  OT uses a moist heat pack, which she states make her feel better, and OT advises to do things that "make you feel better until you are able to try to perform your home exercise program at least somewhat."  OT reviews the exercises with her, helps her put on her edema glove (which she was attempting to put on inside out).  OT informed her that she will discharge today due to very poor progress and new and increasing pain and problems that should be checked out by physician or specialist.  OT gives final recommendations and words of caution including that she will likely lose the use of her right arm for ever, and could put herself in mortal danger by continuing to just use it allow it to swell and not follow-up with her doctors appropriately.  She states understanding but  has been largely noncompliant so far.   PATIENT EDUCATION: Education details: See tx section above for details  Person educated: Patient Education method: Verbal Instruction, Teach back, Handouts  Education comprehension: States and demonstrates understanding, Additional Education required    HOME EXERCISE PROGRAM: Access Code: [email or web address] URL: [email or web address] Date: 04/08/2022 Prepared by: Fannie Knee   GOALS: Goals  reviewed with patient? Yes  SHORT TERM GOALS: Target date: 04/17/22  Pt will demo/state understanding of initial home exercise program (HEP) to improve function, pain, and independence.   Baseline: Needs a plan for rehabilitation  Goal status: 06/03/22: MET  2.  Pt will obtain protective, custom or mobilizing orthotic for safety and to improve function.  Baseline: Needs orthotic support  Goal status: D/C 06/03/22   LONG TERM GOALS: Target date: 06/12/22  Pt will improve functional ability by decreased impairment per Quick DASH assessment to 15% or better, for better quality of life. Baseline: 38.6% Goal status: 05/13/22: Not met- worse now 50%   2.  Pt will improve A/ROM in Rt wrist flex/ext to at least 40* each, to have prerequisite/functional motion for tasks like reach and grasp.  Baseline: 15* / 4* respectively  Goal status: 06/03/22: Not met, worse now   3.  Pt will improve strength in Rt elbow to at least 4+/5 MMT to have increased functional ability to carry out selfcare and higher-level homecare tasks with no difficulty Baseline: 3+/5 MMT Goal status: 06/03/22: Not met, worse now   4.  Pt will improve coordination skills in Rt hand, as seen by increasing score on box and blocks testing to at least 20 blocks to have increased functional ability to carry out fine motor tasks (fasteners, etc.) and more complex, coordinated IADLs (meal prep, sports, etc.).  Baseline: unable to grab a block Goal status: 06/03/22: Not met, worse now   5.   Pt  will improve grip strength in effected hand to at least 15# lbs for functional use at home and in IADLs. Baseline: 0 lbs Goal status: 06/03/22: Not met, worse now   6.   Pt will state knowledge of compensatory methods/adaptations to improve functional deficits with dressing, cooking, pain levels, and other home tasks.  Baseline: Is not using compensatory methods Goal status: 06/03/22: Not met  ASSESSMENT:  CLINICAL IMPRESSION: 06/03/22: She has consistently showed up late or missed appointments to therapy, been lethargic in most sessions, she also has difficulty demonstrating exercises that she claims to have been doing at home.  OT strongly feels that she has not been doing her home exercise program consistently. Additionally OT recognizes that after dialysis treatments her arm is extremely swollen which impacts her ability to move it and use it as ordered.  She now has increasing pain increasing swelling decreasing function and motion, and will be discharged as conservative treatments have now completely failed.  She was cautioned that this is extremely dangerous and should follow-up with her doctors to determine a better course of action.  It may be possible that her dialysis port could be moved to her left arm, she may also benefit from an appointment with edema specialist for custom compressive garments to the right arm.  This info was sent to her primary care physician through messaging.    PLAN:  OT FREQUENCY:D/C now due to failed therapy   OT DURATION: D/C now due to failed therapy   PLANNED INTERVENTIONS: self care/ADL training, therapeutic exercise, therapeutic activity, neuromuscular re-education, manual therapy, passive range of motion, splinting, electrical stimulation, ultrasound, compression bandaging, moist heat, cryotherapy, contrast bath, patient/family education, coping strategies training, and DME and/or AE instructions  RECOMMENDED OTHER SERVICES: she is already receiving SLP  and PT, perhaps would benefit from nutrition consult, if not already going, for CKD and stroke diet recommendations   CONSULTED AND AGREED WITH PLAN OF CARE: Patient and family member/caregiver  PLAN  FOR NEXT SESSION:  D/C now due to failed therapy    Fannie Knee, OTR/L, CHT 06/03/2022, 4:01 PM   OCCUPATIONAL THERAPY DISCHARGE SUMMARY  Visits from Start of Care: 10  Please see note/impression for details, she is doing much worse now and therapy no longer seems appropriate for the right arm. On an emergent basis, she should seek consult with medical specialist regarding possible hemodialysis port change to left side, compressive custom garments for the right arm, medication changes as needed etc. She was informed about all of this and info was sent to her PCP and referring physicians.   Fannie Knee, OTR/L, CHT 06/03/22

## 2022-05-29 NOTE — Therapy (Signed)
OUTPATIENT SPEECH LANGUAGE PATHOLOGY EVALUATION   Patient Name: Monique Henderson MRN: [id number] DOB:10-13-1950, 71 y.o., female Today's Date: 05/29/2022  PCP: Dr. Dorna Mai REFERRING PROVIDER: PA-C Reesa Chew   End of Session - 05/29/22 1109     Visit Number 7    Number of Visits 13    Date for SLP Re-Evaluation 07/07/22    Authorization Type [employer] Medicare    Progress Note Due on Visit 10    SLP Start Time 1107    SLP Stop Time  1740    SLP Time Calculation (min) 38 min    Activity Tolerance Patient tolerated treatment well   per pt, feels "unwell," BP 113/53               Past Medical History:  Diagnosis Date   AF (paroxysmal atrial fibrillation) ([place]) 05/29/2019   on Coumadin   Aortic atherosclerosis ([place]) 07/05/2019   Aortic dissection ([place]) 04/04/2019   s/p repair   Bone spur 2008   Right calcaneal foot spur   Breast cancer ([place]) 2004   Ductal carcinoma in situ of the left breast; S/P left partial mastectomy 02/26/2003; S/P re-excision of cranial and lateral margins11/18/2004.radiation   Carotid artery disease (HCC)    Cerebral thrombosis with cerebral infarction 05/22/2019   Chronic HFrEF (heart failure with reduced ejection fraction) (HCC)    Chronic low back pain 06/22/2016   Chronic obstructive lung disease ([place]) 01/16/2017   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 Autumn Messing; S/P re-excision of superior and inferior margins 10/27/2012.    ESRD on hemodialysis ([place]) 05/29/2019   Essential hypertension 09/16/2006   GERD 09/16/2006   Hepatitis C    treated and RNA confirmed not detectable 01/2017   Insomnia 03/14/2015   Malnutrition of moderate degree 05/19/2019   Non compliance with medical treatment 12/04/2017   Normocytic anemia    With thrombocytosis   Osteoarthritis    Right ureteral stone 2002   S/P lumbar spinal fusion 01/18/2014   S/P lumbar decompressive laminectomy, fusion,  and plating for lumbar spinal stenosis on 05/27/2009 by Dr. Eustace Moore.  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 Eustace Moore.     Stroke (cerebrum) ([place])    Tobacco use disorder 04/19/2009   Uterine fibroid    Wears dentures    top   Past Surgical History:  Procedure Laterality Date   ANTERIOR LAT LUMBAR FUSION N/A 01/18/2014   Procedure: ANTERIOR LATERAL LUMBAR FUSION LUMBAR TWO-THREE;  Surgeon: Eustace Moore, MD;  Location: [facility];  Service: Neurosurgery;  Laterality: N/A;   ANTERIOR LUMBAR FUSION  01/18/2014   AV FISTULA PLACEMENT Left 04/20/2019   Procedure: ARTERIOVENOUS (AV) FISTULA CREATION;  Surgeon: [name], MD;  Location: [facility];  Service: Vascular;  Laterality: Left;   BACK SURGERY     BREAST LUMPECTOMY Left 01/2003   BREAST LUMPECTOMY Right 2014   BREAST LUMPECTOMY WITH NEEDLE LOCALIZATION AND AXILLARY SENTINEL LYMPH NODE BX Right 10/13/2012   Procedure: BREAST LUMPECTOMY WITH NEEDLE LOCALIZATION;  Surgeon: [name], MD;  Location: [facility];  Service: General;  Laterality: Right;  Right breast wire localized lumpectomy   INSERTION OF DIALYSIS CATHETER Right 04/20/2019   Procedure: INSERTION OF DIALYSIS CATHETER, right  internal jugular;  Surgeon: [name], MD;  Location: [facility];  Service: Vascular;  Laterality: Right;   INSERTION OF DIALYSIS CATHETER Right 09/24/2020   Procedure: INSERTION OF TUNNELED DIALYSIS CATHETER;  Surgeon: Rosetta Posner, MD;  Location: [facility];  Service: Vascular;  Laterality: Right;   IR FLUORO GUIDE CV LINE RIGHT  09/22/2020   IR THORACENTESIS ASP PLEURAL SPACE W/IMG GUIDE  05/19/2019   IR US GUIDE VASC ACCESS LEFT  09/22/2020   IR US GUIDE VASC ACCESS RIGHT  09/22/2020   IR VENOCAVAGRAM SVC   09/22/2020   LAMINECTOMY  05/27/2009   Lumbar decompressive laminectomy, fusion and plating for lumbar spinal stensosis   LIGATION OF ARTERIOVENOUS  FISTULA Left 09/24/2020   Procedure: LIGATION OF LEFT ARM ARTERIOVENOUS  FISTULA;  Surgeon: Rosetta Posner, MD;  Location: [facility];  Service: Vascular;  Laterality: Left;   LUMBAR LAMINECTOMY/DECOMPRESSION MICRODISCECTOMY Left 03/23/2013   Procedure: LUMBAR LAMINECTOMY/DECOMPRESSION MICRODISCECTOMY 1 LEVEL;  Surgeon: Eustace Moore, MD;  Location: [facility];  Service: Neurosurgery;  Laterality: Left;  LUMBAR LAMINECTOMY/DECOMPRESSION MICRODISCECTOMY 1 LEVEL   MASTECTOMY, PARTIAL Left 02/26/2003   ; S/P re-excision of cranial and lateral margins 04/19/2003.    RE-EXCISION OF BREAST CANCER,SUPERIOR MARGINS Right 10/27/2012   Procedure: RE-EXCISION OF BREAST CANCER,SUPERIOR and inferior MARGINS;  Surgeon: [name], MD;  Location: [facility];  Service: General;  Laterality: Right;   RE-EXCISION OF BREAST LUMPECTOMY Left 04/2003   TEE WITHOUT CARDIOVERSION N/A 04/04/2019   Procedure: Transesophageal Echocardiogram ([place]);  Surgeon: Wonda Olds, MD;  Location: [facility];  Service: Open Heart Surgery;  Laterality: N/A;   THORACIC AORTIC ANEURYSM REPAIR N/A 04/04/2019   Procedure: THORACIC ASCENDING ANEURYSM REPAIR (AAA)  USING 28 MM X 30 CM HEMASHIELD PLATINUM VASCULAR GRAFT;  Surgeon: Wonda Olds, MD;  Location[facility];  Service: Open Heart Surgery;  Laterality: N/A;   Patient Active Problem List   Diagnosis Date Noted   Right hemiparesis ([place]) 04/06/2022   Acute cardioembolic stroke ([place]) 51/07/5850   Stroke (cerebrum) ([place]) 02/23/2022   Stroke ([place]) 02/23/2022   Hypervolemia associated with renal insufficiency 01/29/2022   Dyslipidemia 01/29/2022   DNR (do not resuscitate) 01/29/2022   Polysubstance abuse ([place]) 01/06/2022   Acute on chronic combined systolic and diastolic CHF (congestive heart failure) ([place]) 01/06/2022   Abdominal pain  77/82/4235   Acute metabolic encephalopathy 36/14/4315   Malignant HTN with heart disease, w/o CHF, 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 12/24/2021   History of hepatitis C 12/08/2021   Hypertensive urgency 10/29/2021   Fluid overload 10/29/2021   Acute on chronic HFrEF (heart failure with reduced ejection fraction) ([place]) 10/29/2021   Chronic anticoagulation 10/29/2021   Prolonged QT interval 10/29/2021   Leukopenia 10/29/2021   SOB (shortness of breath) 09/15/2021   Hypertensive emergency 08/23/2021   Altered mental status    ESRD (end stage renal disease) on dialysis with hyperkalemia and metabolic acidosis with elevated AG  08/12/2021   Malignant hypertension 06/19/2021   Anemia 06/05/2021   Chronic diastolic congestive heart failure ([place]) 04/30/2021   Pure hypercholesterolemia 02/14/2021   Acute pulmonary edema ([place]) 12/23/2020   Acute respiratory failure with hypoxia ([place]) 09/21/2020   Acute renal failure superimposed on chronic kidney disease ([place]) 09/21/2020   Community acquired pneumonia 09/21/2020   Uremia 40/12/6759   Metabolic acidosis with increased anion gap and accumulation of organic acids 09/21/2020   Hypertensive crisis 09/21/2020   Troponin level elevated 08/28/2019   Positive  D dimer 08/28/2019   Hyperkalemia 08/28/2019   SVT (supraventricular tachycardia) 08/28/2019   SIRS (systemic inflammatory response syndrome) ([place]) 08/27/2019   Paroxysmal SVT (supraventricular tachycardia) 08/27/2019   Aortic atherosclerosis ([place]) 07/05/2019   Chest pain 07/05/2019   History of CVA (cerebrovascular accident) 05/29/2019   AF (paroxysmal atrial fibrillation) ([place]) 05/29/2019   ESRD on dialysis ([place]) 05/29/2019   Cerebral thrombosis with cerebral infarction 05/22/2019   Malnutrition of moderate degree 05/19/2019   Pleural effusion 05/18/2019   Atrial fibrillation with RVR ([place]) 05/18/2019   S/P aortic aneurysm repair 04/07/2019   Aortic dissection ([place]) 04/04/2019   Dissection of  aorta ([place]) 04/03/2019   Non compliance with medical treatment 12/04/2017   Chronic obstructive lung disease ([place]) 01/16/2017   Chronic low back pain 06/22/2016   Insomnia 03/14/2015   S/P lumbar spinal fusion 01/18/2014   Tobacco use disorder 04/19/2009   GERD 09/16/2006    ONSET DATE: 02/23/2022   REFERRING DIAG: I63.9 (ICD-10-CM) - Cerebral infarction, unspecified   THERAPY DIAG:  Cognitive communication deficit  Dysarthria and anarthria  Rationale for Evaluation and Treatment: Rehabilitation  SUBJECTIVE:   SUBJECTIVE STATEMENT: "When I go to dialysis, they tell me I'm doing great"  PAIN:  Are you having pain? denies   OBJECTIVE:   TODAY'S TREATMENT: 05-29-22: Reviewed strategies and compensations covered over ST course which have been helpful in increasing pt's communication efficacy, handout provided. SLP led pt through reading and generative speech exercises to assess her speech production and ability to carryover targeted skills. During short story reading, of 5 paragraphs, pt with 90% intelligibility.  Volume averages 64 dB prior to SLP verbal cues for increased intensity. Then average raises to 68 dB.During generative speech exercise, pt asked to list 3 items you'd find in given location. Pt with 100% intelligibility over 7 trials. Demonstrates reduced rate and over-articulation throughout structured practice. Distortions persist with fricatives, which is likely attributed to missing dentition. In conversation this date, approximately 90% intelligible with rare requests for repetition.  SLP re-administers patient reported outcome measure:   Communication Effectiveness Survey: 23 2: Stranger on phone, emotionally upset or angry Discussion regarding strategies to aid in communication during challenges ID. Pt agreeable to ST discharge. Verbalizes appreciation for therapy course.   05-20-22: Pt reports family is noticing a positive change in clarity of verbal communication.  Pt ID improved ability to employ dysarthria strategies has resulted in reduced instances of requests to repeat self. Target improved intelligibility through employment of dysarthria strategies during naming activity in which pt generates x3 items. Pt with intelligible approximation in 90% of opportunities, demonstrating reduced rate, over-articulation, and syllabification PRN. When asked open-ended follow up question, pt evidences intelligible response in 70% of opportunities given usual mod-A. In connected speech, pt with increased difficulty maintaining rate and clear production of phonemes but does benefit from preliminary reminder for use of strategies. Usual verbal cues to swallow prior to initiation of speech to improve secretion management and improve clarity of speech. SLP reviewed strategies to aid in communication efficacy for the dysarthric speaker. Pt reports to reducing distractions and having 1 on 1 conversations as beneficial.   05-04-22: SLP educated on diet recommendations since pt was placed on alternative diet while recently admitted to hospital. Education provided on swallowing strategies and compensations to aid in continued ability to enjoy preferred foods at home. Handout provided, pt verbalizes understanding, denies questions regarding recommendations. Target employment of dysarthria strategies- syllabification, increased intensity, reduced rate- at generative sentence level with  3 syllable words containing consonant blends. At single word, reading, level 90% accuracy initial attempt. 70% accuracy at generative sentence level initial attempt with occasional min-A. Intermittent ability to self-monitor for accuracy of production with pt able to self correct x4. Pt read 2 paragraph reading with many /sk/ blends, demonstrating carryover of reduced rate and over articulation with mod-I. Rare self-correction evidences pt with awareness of production during this exercise. Averages 71 dB during  paragraph reading.   04-27-22: Pt reports to improved communication across variety of communication partners. In opening conversation of 10 minutes, pt evidences reduced rate and syllabification with rare need for request for repetition. SLP provided education and modeling of dysarthria strategies of slowed rate and over articulation. Following, pt provided with oral reading passage. Achieved 70% intelligibility initial attempt. With re-education and coaching on tricky words, pt able to achieve 90% accuracy on second attempt of same stimuli Novel stimuli, pt employs strategies and achieves 88% intelligibility on initial attempt. Education on topic maintenance or context to aid in communication efficacy.   04-20-22: SLP provided additional education on dysarthria strategies to aid in overall intelligibility with focus this date on slowed rate, overarticulation, and increased vocal intensity. Given tri-syllabic words containing /s/ in all positions, pt able to produce at word level 85% accuracy on intial attempt; produce in generative sentence 75% accuracy. SLP provides occasional mod-A for use of strategies with occasional requirement for direct model. Pt demonstrates spontaneous self correction x4 over course of structured practice. At conclusion of session, pt ID "break up my words" as strategy which A in improved intelligibility.    04-13-22: SLP provided education on evaluation observations and recommendations for therapy course. Initiated training re: dysarthria compensations and strategies to aid in improved communication efficacy. Collaborated with pt to establish POC and goals. Pt in agreement with POC and denies questions at conclusion of evaluation.    PATIENT EDUCATION: Education details: see above Person educated: Patient and sister Education method: Customer service manager Education comprehension: verbalized understanding, returned demonstration, and needs further  education   GOALS: Goals reviewed with patient? Yes  SHORT TERM GOALS: Target date: 05/05/2022  Pt will intelligibly produce multi-syllabic words containing blends at generative sentence level in 18/20 trials with rare min-A Baseline: 05/04/22 Goal status: PARTIALLY MET  2.  Pt will teach back strategies and compensations to aid in communication efficacy with various communication partners  Baseline: 04/27/22 Goal status: MET  3.  Pt will maintain 68+ dB average during 5 minute conversation with occasional mod-A over 2 sessions Baseline: 05/04/22 Goal status: MET  4.  Pt will read 1 paragraph with use of compensations PRN, demonstrating carryover of dysarthria strategies with rare min-A Baseline: 05/04/22 Goal status: MET   LONG TERM GOALS: Target date: 07/07/2022  Pt will report BID dysarthria home practice 5/7 days over 1 week period with mod-I Baseline: 05-29-22 Goal status: MET  2.  Pt will maintain 68+ dB average over 20 minute conversation with occasional min-A Baseline: 05-29-22 Goal status: MET  3.  Pt will complete mult-paragraph oral reading, demonstrating carryover of dysarthria strategies with rare min-A Baseline: 05-29-22 Goal status: MET  4.  Pt will report improve subjective perception of speech efficacy via patient reported outcome measure by d/c  Baseline: 21, 23 Goal status: MET   ASSESSMENT:  CLINICAL IMPRESSION: Session focused on employment of dysarthria compensations in progressively difficult speech tasks to aid in generalization to conversational speech. Pt evidencing increasing skill in self-monitoring and correction. SLP reviewed compensations  and strategies which have been beneficial for pt's communication over therapy course. Pt is agreeable to d/c, reports improvement in ability to communicate with variety of communication partners: family, friends, nurses and doctors at dialysis.   OBJECTIVE IMPAIRMENTS: include dysarthria. These impairments  are limiting patient from effectively communicating at home and in community. Factors affecting potential to achieve goals and functional outcome are severity of impairments. Patient will benefit from skilled SLP services to address above impairments and improve overall function.  REHAB POTENTIAL: Good  PLAN:  SLP FREQUENCY: 1x/week  SLP DURATION: 12 weeks  PLANNED INTERVENTIONS: Language facilitation, Cueing hierachy, Internal/external aids, Functional tasks, Multimodal communication approach, SLP instruction and feedback, Compensatory strategies, and Patient/family education  SPEECH THERAPY DISCHARGE SUMMARY  Visits from Start of Care: 7  Current functional level related to goals / functional outcomes: Improved intelligibility, which pt reports is evidenced across situations, locations, and communication partners. Increased confidence in speech.    Remaining deficits: Dysarthria   Education / Equipment: Dysarthria strategies and compensations, HEP   Patient agrees to discharge. Patient goals were met. Patient is being discharged due to meeting the stated rehab goals.     Su Monks, [place] 05/29/2022, 11:09 AM

## 2022-06-02 NOTE — Therapy (Signed)
OUTPATIENT PHYSICAL THERAPY NEURO TREATMENT     Patient Name: Monique Henderson MRN: [id number] DOB:Aug 30, 1950, 72 y.o., female Today's Date: 12/1/32023     PCP: Dorna Mai REFERRING PROVIDER: Love              Past Medical History:  Diagnosis Date   AF (paroxysmal atrial fibrillation) (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 Autumn Messing; S/P re-excision of superior and inferior margins 10/27/2012.    ESRD on hemodialysis ([place]) 05/29/2019   Essential hypertension 09/16/2006   GERD 09/16/2006   Hepatitis C      treated and RNA confirmed not detectable 01/2017   Insomnia 03/14/2015   Malnutrition of moderate degree 05/19/2019   Non compliance with medical treatment 12/04/2017   Normocytic anemia      With thrombocytosis   Osteoarthritis     Right ureteral stone 2002   S/P lumbar spinal fusion 01/18/2014    S/P lumbar decompressive laminectomy, fusion, and plating for lumbar spinal stenosis on 05/27/2009 by Dr. Eustace Moore.  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 Eustace Moore.     Tobacco use disorder 04/19/2009   Uterine fibroid     Wears dentures      top             Past Surgical History:  Procedure Laterality Date    ANTERIOR LAT LUMBAR FUSION N/A 01/18/2014    Procedure: ANTERIOR LATERAL LUMBAR FUSION LUMBAR TWO-THREE;  Surgeon: Eustace Moore, MD;  Location: [facility];  Service: Neurosurgery;  Laterality: N/A;   ANTERIOR LUMBAR FUSION   01/18/2014   AV FISTULA PLACEMENT Left 04/20/2019    Procedure: ARTERIOVENOUS (AV) FISTULA CREATION;  Surgeon: [name], MD;  Location: [facility];  Service: Vascular;  Laterality: Left;   BACK SURGERY       BREAST LUMPECTOMY Left 01/2003   BREAST LUMPECTOMY Right 2014   BREAST LUMPECTOMY WITH NEEDLE LOCALIZATION AND AXILLARY SENTINEL LYMPH NODE BX Right 10/13/2012    Procedure: BREAST LUMPECTOMY WITH NEEDLE LOCALIZATION;  Surgeon: [name], MD;  Location: [facility];  Service: General;  Laterality: Right;  Right breast wire localized lumpectomy   INSERTION OF DIALYSIS CATHETER Right 04/20/2019    Procedure: INSERTION OF DIALYSIS CATHETER, right internal jugular;  Surgeon: [name], MD;  Location: [facility];  Service: Vascular;  Laterality: Right;   INSERTION OF DIALYSIS CATHETER Right 09/24/2020    Procedure: INSERTION OF TUNNELED DIALYSIS CATHETER;  Surgeon: Rosetta Posner, MD;  Location: [facility];  Service: Vascular;  Laterality: Right;   IR FLUORO GUIDE CV LINE RIGHT   09/22/2020   IR THORACENTESIS ASP PLEURAL SPACE W/IMG GUIDE   05/19/2019   IR US GUIDE VASC  ACCESS LEFT   09/22/2020   IR US GUIDE VASC ACCESS RIGHT   09/22/2020   IR VENOCAVAGRAM SVC   09/22/2020   LAMINECTOMY   05/27/2009    Lumbar decompressive laminectomy, fusion and plating for lumbar spinal stensosis   LIGATION OF ARTERIOVENOUS  FISTULA Left 09/24/2020    Procedure: LIGATION OF LEFT ARM ARTERIOVENOUS  FISTULA;  Surgeon: Rosetta Posner, MD;  Location: [facility];  Service: Vascular;  Laterality: Left;   LUMBAR LAMINECTOMY/DECOMPRESSION MICRODISCECTOMY Left 03/23/2013    Procedure: LUMBAR LAMINECTOMY/DECOMPRESSION MICRODISCECTOMY 1 LEVEL;  Surgeon: Eustace Moore, MD;   Location: [facility];  Service: Neurosurgery;  Laterality: Left;  LUMBAR LAMINECTOMY/DECOMPRESSION MICRODISCECTOMY 1 LEVEL   MASTECTOMY, PARTIAL Left 02/26/2003    ; S/P re-excision of cranial and lateral margins 04/19/2003.    RE-EXCISION OF BREAST CANCER,SUPERIOR MARGINS Right 10/27/2012    Procedure: RE-EXCISION OF BREAST CANCER,SUPERIOR and inferior MARGINS;  Surgeon: [name], MD;  Location: [facility];  Service: General;  Laterality: Right;   RE-EXCISION OF BREAST LUMPECTOMY Left 04/2003   TEE WITHOUT CARDIOVERSION N/A 04/04/2019    Procedure: Transesophageal Echocardiogram ([place]);  Surgeon: Wonda Olds, MD;  Location: [facility];  Service: Open Heart Surgery;  Laterality: N/A;   THORACIC AORTIC ANEURYSM REPAIR N/A 04/04/2019    Procedure: THORACIC ASCENDING ANEURYSM REPAIR (AAA)  USING 28 MM X 30 CM HEMASHIELD PLATINUM VASCULAR GRAFT;  Surgeon: Wonda Olds, MD;  Location[facility];  Service: Open Heart Surgery;  Laterality: N/A;           Patient Active Problem List    Diagnosis Date Noted   Acute cardioembolic stroke ([place]) 37/16/9678   Stroke (cerebrum) ([place]) 02/23/2022   Stroke ([place]) 02/23/2022   Hypervolemia associated with renal insufficiency 01/29/2022   Dyslipidemia 01/29/2022   DNR (do not resuscitate) 01/29/2022   Polysubstance abuse ([place]) 01/06/2022   Acute on chronic combined systolic and diastolic CHF (congestive heart failure) ([place]) 01/06/2022   Abdominal pain 93/81/0175   Acute metabolic encephalopathy 03/25/8526   Malignant HTN with heart disease, w/o CHF, 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 12/24/2021   History of hepatitis C 12/08/2021   Hypertensive urgency 10/29/2021   Fluid overload 10/29/2021   Acute on chronic HFrEF (heart failure with reduced ejection fraction) ([place]) 10/29/2021   Chronic anticoagulation 10/29/2021   Prolonged QT interval 10/29/2021   Leukopenia 10/29/2021   SOB (shortness of breath) 09/15/2021   Hypertensive emergency 08/23/2021   Altered  mental status     ESRD (end stage renal disease) on dialysis with hyperkalemia and metabolic acidosis with elevated AG  08/12/2021   Malignant hypertension 06/19/2021   Anemia 06/05/2021   Pure hypercholesterolemia 02/14/2021   Acute pulmonary edema ([place]) 12/23/2020   Acute respiratory failure with hypoxia ([place]) 09/21/2020   Acute renal failure superimposed on chronic kidney disease ([place]) 09/21/2020   Community acquired pneumonia 09/21/2020   Uremia 78/24/2353   Metabolic acidosis with increased anion gap and accumulation of organic acids 09/21/2020   Hypertensive crisis 09/21/2020   Troponin level elevated 08/28/2019   Positive D dimer 08/28/2019   Hyperkalemia 08/28/2019   SVT (supraventricular tachycardia) 08/28/2019   SIRS (systemic inflammatory response syndrome) ([place]) 08/27/2019   Paroxysmal SVT (supraventricular tachycardia) 08/27/2019   Aortic atherosclerosis ([place]) 07/05/2019   Chest pain 07/05/2019   History of CVA (cerebrovascular accident) 05/29/2019   AF (paroxysmal atrial fibrillation) ([place]) 05/29/2019   ESRD on dialysis ([place]) 05/29/2019   Cerebral thrombosis with cerebral infarction 05/22/2019  Malnutrition of moderate degree 05/19/2019   Pleural effusion 05/18/2019   Atrial fibrillation with RVR ([place]) 05/18/2019   S/P aortic aneurysm repair 04/07/2019   Aortic dissection ([place]) 04/04/2019   Dissection of aorta ([place]) 04/03/2019   Non compliance with medical treatment 12/04/2017   Chronic obstructive lung disease ([place]) 01/16/2017   Chronic low back pain 06/22/2016   Insomnia 03/14/2015   S/P lumbar spinal fusion 01/18/2014   Tobacco use disorder 04/19/2009   GERD 09/16/2006      ONSET DATE: 02/24/22   REFERRING DIAG: Acute Stroke   THERAPY DIAG:  No diagnosis found.   Rationale for Evaluation and Treatment: Rehabilitation   SUBJECTIVE:                                                                                                                                                                                              SUBJECTIVE STATEMENT:   Pt is a 72 y.o. female who [facility] with R sided facial droop, R UE weakness, dysarthria. MRI revealed acute to subacute cortical infarct in the L posterior frontal and anterior parietal lobe, smaller acute to subacute infarct in L caudate tail, occipital lobe and frontal lobe. Noted 8/31 admission due to volume overload.  PMH includes ESRD on HD TTS, HTN, PAF, polysubstance abuse, aortic aneurysm dissection s/p repair. Hospitalized 04/29/22 after non-compliance with HD due to severe R hand pain.      Pt accompanied by: self   PERTINENT HISTORY: see above   PAIN:  Are you having pain? no   PRECAUTIONS: None   WEIGHT BEARING RESTRICTIONS: No   FALLS: Has patient fallen in last 6 months? No   LIVING ENVIRONMENT: Lives with: lives with an adult companion Lives in: House/apartment Stairs: Yes: Internal: 15 steps; on left going up Has following equipment at home: None   PLOF:  cook, clean, some flowers   PATIENT GOALS: walk better,    OBJECTIVE:    COGNITION: Overall cognitive status: Within functional limits for tasks assessed             SENSATION: WFL   COORDINATION: LE pretty good, some difficulty with foot motions/ankle     LOWER EXTREMITY ROM:      Active  Right Eval Left Eval  Hip flexion 90    Hip extension      Hip abduction      Hip adduction      Hip internal rotation      Hip external rotation      Knee flexion      Knee extension 6    Ankle  dorsiflexion 10    Ankle plantarflexion      Ankle inversion 5    Ankle eversion 12     (Blank rows = not tested)   LOWER EXTREMITY MMT:     MMT Right Eval Left Eval  Hip flexion 4-    Hip extension      Hip abduction 4    Hip adduction 4    Hip internal rotation      Hip external rotation      Knee flexion 4-    Knee extension 3+    Ankle dorsiflexion 4-    Ankle plantarflexion 4-    Ankle  inversion 4-    Ankle eversion 4-    (Blank rows = not tested)   TRANSFERS: Assistive device utilized: None  Sit to stand: Complete Independence Stand to sit: Complete Independence   STAIRS: Level of Assistance: SBA Stair Negotiation Technique: Alternating Pattern  with Single Rail on Right Number of Stairs: 4  Height of Stairs: 6    Comments: some impulsivity and unsafe with turns   GAIT: Gait pattern: WFL Distance walked: 60 feet Assistive device utilized: None Level of assistance: SBA Comments: some issues with turns and on uneven surfaces   FUNCTIONAL TESTS:  5 times sit to stand: 16, 12/13-18 Timed up and go (TUG): 13, 10.58- unsteady during both turns. Berg Balance Scale: 48/56, 12/13- 51/56   TODAY'S TREATMENT:     06/03/22 NuStep L3 x55mns Resisted gait 20# 4 way x4  Leg ext 5# 2x10 HS curls 20# 2x10  STS on airex 2x10 Side steps on airex Step ups from airex onto 6" step   05/13/22    NuStep L3 x 6 minutes Functional re-assessment for evaluation B side stepping on Airex beam, 5 x each direction. Runner's step ups onto airex pad with R leg x 10- min A with her hand for balance 4 square stepping clockwise, counter clockwise, then changing directions upon therapist's command. Gait assessment/training with increased speed, S x 150', more normalized gait pattern noted.                                                                                                    PATIENT EDUCATION: Education details: POC, updates Person educated: Patient  Education method: Explanation Education comprehension: verbalized understanding   HOME EXERCISE PROGRAM: TBD for LE and balance     GOALS: Goals reviewed with patient? Yes   SHORT TERM GOALS: Target date: 05/29/22   Independent with initial HEP Goal status: INITIAL   LONG TERM GOALS: Target date: 07/16/22   Increase Berg balance score to 52/56 Goal status: 12/13-51/56,o ngoing   2.  Understand safety and fall  risks at home Goal status: INITIAL   3.  Increase right LE strength to 4+/5 Goal status: INITIAL   4.  Walk >1000 feet uneven terrain without difficulty Goal status: INITIAL   ASSESSMENT:   CLINICAL IMPRESSION: Patient does well with all interventions today working on strength and balance. Some instability on airex but does better with repetitions. Will benefit from more LE strengthening.  OBJECTIVE IMPAIRMENTS: Abnormal gait, cardiopulmonary status limiting activity, decreased activity tolerance, decreased balance, decreased coordination, decreased endurance, decreased mobility, difficulty walking, decreased ROM, decreased strength, and decreased safety awareness.    REHAB POTENTIAL: Good   CLINICAL DECISION MAKING: Stable/uncomplicated   EVALUATION COMPLEXITY: mod   PLAN:   PT FREQUENCY: 2x/week   PT DURATION: 9 weeks   PLANNED INTERVENTIONS: Therapeutic exercises, Therapeutic activity, Neuromuscular re-education, Balance training, Gait training, Patient/Family education, Self Care, Joint mobilization, Stair training, and Manual therapy   PLAN FOR NEXT SESSION: Overall she is progressing well after stroke, she has the most deficits with her right hand and her speech, some deficits with strength and motions of the LE, some impulsivity and balance issues     Ethel Rana DPT 05/13/22 2:39 PM

## 2022-06-03 ENCOUNTER — Encounter: Payer: Self-pay | Admitting: Rehabilitative and Restorative Service Providers"

## 2022-06-03 ENCOUNTER — Ambulatory Visit: Payer: 59 | Admitting: Rehabilitative and Restorative Service Providers"

## 2022-06-03 ENCOUNTER — Ambulatory Visit: Payer: 59 | Attending: Internal Medicine

## 2022-06-03 DIAGNOSIS — R262 Difficulty in walking, not elsewhere classified: Secondary | ICD-10-CM | POA: Diagnosis present

## 2022-06-03 DIAGNOSIS — R278 Other lack of coordination: Secondary | ICD-10-CM | POA: Insufficient documentation

## 2022-06-03 DIAGNOSIS — R600 Localized edema: Secondary | ICD-10-CM | POA: Diagnosis present

## 2022-06-03 DIAGNOSIS — M25641 Stiffness of right hand, not elsewhere classified: Secondary | ICD-10-CM

## 2022-06-03 DIAGNOSIS — M25631 Stiffness of right wrist, not elsewhere classified: Secondary | ICD-10-CM | POA: Insufficient documentation

## 2022-06-03 DIAGNOSIS — M6281 Muscle weakness (generalized): Secondary | ICD-10-CM | POA: Diagnosis not present

## 2022-06-05 NOTE — Therapy (Signed)
OUTPATIENT PHYSICAL THERAPY NEURO TREATMENT     Patient Name: Monique [facility] MRN: [id number] DOB:Apr 02, 1951, 72 y.o., female Today's Date: 12/1/32023     PCP: Dorna Mai REFERRING PROVIDER: Love              Past Medical History:  Diagnosis Date   AF (paroxysmal atrial fibrillation) ([place]) 05/29/2019    on Coumadin   Aortic atherosclerosis ([place]) 07/05/2019   Aortic dissection ([place]) 04/04/2019    s/p repair   Bone spur 2008    Right calcaneal foot spur   Breast cancer ([place]) 2004    Ductal carcinoma in situ of the left breast; S/P left partial mastectomy 02/26/2003; S/P re-excision of cranial and lateral margins11/18/2004.radiation   Cerebral thrombosis with cerebral infarction 05/22/2019   Chronic low back pain 06/22/2016   Chronic obstructive lung disease ([place]) 01/16/2017   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 Autumn Messing; S/P re-excision of superior and inferior margins 10/27/2012.    ESRD on hemodialysis ([place]) 05/29/2019   Essential hypertension 09/16/2006   GERD 09/16/2006   Hepatitis C      treated and RNA confirmed not detectable 01/2017   Insomnia 03/14/2015   Malnutrition of moderate degree 05/19/2019   Non compliance with medical treatment 12/04/2017   Normocytic anemia      With thrombocytosis   Osteoarthritis     Right ureteral stone 2002   S/P lumbar spinal fusion 01/18/2014    S/P lumbar decompressive laminectomy, fusion, and plating for lumbar spinal stenosis on 05/27/2009 by Dr. Eustace Moore.  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 Eustace Moore.     Tobacco use disorder 04/19/2009   Uterine fibroid     Wears dentures      top             Past Surgical History:  Procedure Laterality Date    ANTERIOR LAT LUMBAR FUSION N/A 01/18/2014    Procedure: ANTERIOR LATERAL LUMBAR FUSION LUMBAR TWO-THREE;  Surgeon: Eustace Moore, MD;  Location: [facility];  Service: Neurosurgery;  Laterality: N/A;   ANTERIOR LUMBAR FUSION   01/18/2014   AV FISTULA PLACEMENT Left 04/20/2019    Procedure: ARTERIOVENOUS (AV) FISTULA CREATION;  Surgeon: [name], MD;  Location: [facility];  Service: Vascular;  Laterality: Left;   BACK SURGERY       BREAST LUMPECTOMY Left 01/2003   BREAST LUMPECTOMY Right 2014   BREAST LUMPECTOMY WITH NEEDLE LOCALIZATION AND AXILLARY SENTINEL LYMPH NODE BX Right 10/13/2012    Procedure: BREAST LUMPECTOMY WITH NEEDLE LOCALIZATION;  Surgeon: [name], MD;  Location: [facility];  Service: General;  Laterality: Right;  Right breast wire localized lumpectomy   INSERTION OF DIALYSIS CATHETER Right 04/20/2019    Procedure: INSERTION OF DIALYSIS CATHETER, right internal jugular;  Surgeon: [name], MD;  Location: [facility];  Service: Vascular;  Laterality: Right;   INSERTION OF DIALYSIS CATHETER Right 09/24/2020    Procedure: INSERTION OF TUNNELED DIALYSIS CATHETER;  Surgeon: Rosetta Posner, MD;  [facility];  Service: Vascular;  Laterality: Right;   IR FLUORO GUIDE CV LINE RIGHT   09/22/2020   IR THORACENTESIS ASP PLEURAL SPACE W/IMG GUIDE   05/19/2019   IR US GUIDE VASC  ACCESS LEFT   09/22/2020   IR US GUIDE VASC ACCESS RIGHT   09/22/2020   IR VENOCAVAGRAM SVC   09/22/2020   LAMINECTOMY   05/27/2009    Lumbar decompressive laminectomy, fusion and plating for lumbar spinal stensosis   LIGATION OF ARTERIOVENOUS  FISTULA Left 09/24/2020    Procedure: LIGATION OF LEFT ARM ARTERIOVENOUS  FISTULA;  Surgeon: Rosetta Posner, MD;  Location: [facility];  Service: Vascular;  Laterality: Left;   LUMBAR LAMINECTOMY/DECOMPRESSION MICRODISCECTOMY Left 03/23/2013    Procedure: LUMBAR LAMINECTOMY/DECOMPRESSION MICRODISCECTOMY 1 LEVEL;  Surgeon: Eustace Moore, MD;   Location: [facility];  Service: Neurosurgery;  Laterality: Left;  LUMBAR LAMINECTOMY/DECOMPRESSION MICRODISCECTOMY 1 LEVEL   MASTECTOMY, PARTIAL Left 02/26/2003    ; S/P re-excision of cranial and lateral margins 04/19/2003.    RE-EXCISION OF BREAST CANCER,SUPERIOR MARGINS Right 10/27/2012    Procedure: RE-EXCISION OF BREAST CANCER,SUPERIOR and inferior MARGINS;  Surgeon: [name], MD;  Location: [facility];  Service: General;  Laterality: Right;   RE-EXCISION OF BREAST LUMPECTOMY Left 04/2003   TEE WITHOUT CARDIOVERSION N/A 04/04/2019    Procedure: Transesophageal Echocardiogram ([facility]);  Surgeon: Wonda Olds, MD;  Location: [facility];  Service: Open Heart Surgery;  Laterality: N/A;   THORACIC AORTIC ANEURYSM REPAIR N/A 04/04/2019    Procedure: THORACIC ASCENDING ANEURYSM REPAIR (AAA)  USING 28 MM X 30 CM HEMASHIELD PLATINUM VASCULAR GRAFT;  Surgeon: Wonda Olds, MD;  [facility];  Service: Open Heart Surgery;  Laterality: N/A;           Patient Active Problem List    Diagnosis Date Noted   Acute cardioembolic stroke ([place]) 16/03/9603   Stroke (cerebrum) ([place]) 02/23/2022   Stroke ([place]) 02/23/2022   Hypervolemia associated with renal insufficiency 01/29/2022   Dyslipidemia 01/29/2022   DNR (do not resuscitate) 01/29/2022   Polysubstance abuse ([place]) 01/06/2022   Acute on chronic combined systolic and diastolic CHF (congestive heart failure) ([place]) 01/06/2022   Abdominal pain 54/01/8118   Acute metabolic encephalopathy 14/78/2956   Malignant HTN with heart disease, w/o CHF, 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 12/24/2021   History of hepatitis C 12/08/2021   Hypertensive urgency 10/29/2021   Fluid overload 10/29/2021   Acute on chronic HFrEF (heart failure with reduced ejection fraction) ([place]) 10/29/2021   Chronic anticoagulation 10/29/2021   Prolonged QT interval 10/29/2021   Leukopenia 10/29/2021   SOB (shortness of breath) 09/15/2021   Hypertensive emergency 08/23/2021   Altered  mental status     ESRD (end stage renal disease) on dialysis with hyperkalemia and metabolic acidosis with elevated AG  08/12/2021   Malignant hypertension 06/19/2021   Anemia 06/05/2021   Pure hypercholesterolemia 02/14/2021   Acute pulmonary edema ([place]) 12/23/2020   Acute respiratory failure with hypoxia ([place]) 09/21/2020   Acute renal failure superimposed on chronic kidney disease ([place]) 09/21/2020   Community acquired pneumonia 09/21/2020   Uremia 21/30/8657   Metabolic acidosis with increased anion gap and accumulation of organic acids 09/21/2020   Hypertensive crisis 09/21/2020   Troponin level elevated 08/28/2019   Positive D dimer 08/28/2019   Hyperkalemia 08/28/2019   SVT (supraventricular tachycardia) 08/28/2019   SIRS (systemic inflammatory response syndrome) ([place]) 08/27/2019   Paroxysmal SVT (supraventricular tachycardia) 08/27/2019   Aortic atherosclerosis ([place]) 07/05/2019   Chest pain 07/05/2019   History of CVA (cerebrovascular accident) 05/29/2019   AF (paroxysmal atrial fibrillation) ([place]) 05/29/2019   ESRD on dialysis ([place]) 05/29/2019   Cerebral thrombosis with cerebral infarction 05/22/2019  Malnutrition of moderate degree 05/19/2019   Pleural effusion 05/18/2019   Atrial fibrillation with RVR ([place]) 05/18/2019   S/P aortic aneurysm repair 04/07/2019   Aortic dissection ([place]) 04/04/2019   Dissection of aorta ([place]) 04/03/2019   Non compliance with medical treatment 12/04/2017   Chronic obstructive lung disease ([place]) 01/16/2017   Chronic low back pain 06/22/2016   Insomnia 03/14/2015   S/P lumbar spinal fusion 01/18/2014   Tobacco use disorder 04/19/2009   GERD 09/16/2006      ONSET DATE: 02/24/22   REFERRING DIAG: Acute Stroke   THERAPY DIAG:  No diagnosis found.   Rationale for Evaluation and Treatment: Rehabilitation   SUBJECTIVE:                                                                                                                                                                                              SUBJECTIVE STATEMENT: I don't feel like I need PT. I don't have any issues with balance or walking.     Pt accompanied by: self   PERTINENT HISTORY: see above   PAIN:  Are you having pain? no   PRECAUTIONS: None   WEIGHT BEARING RESTRICTIONS: No   FALLS: Has patient fallen in last 6 months? No   LIVING ENVIRONMENT: Lives with: lives with an adult companion Lives in: House/apartment Stairs: Yes: Internal: 15 steps; on left going up Has following equipment at home: None   PLOF:  cook, clean, some flowers   PATIENT GOALS: walk better,    OBJECTIVE:    COGNITION: Overall cognitive status: Within functional limits for tasks assessed             SENSATION: WFL   COORDINATION: LE pretty good, some difficulty with foot motions/ankle     LOWER EXTREMITY ROM:      Active  Right Eval Left Eval  Hip flexion 90    Hip extension      Hip abduction      Hip adduction      Hip internal rotation      Hip external rotation      Knee flexion      Knee extension 6    Ankle dorsiflexion 10    Ankle plantarflexion      Ankle inversion 5    Ankle eversion 12     (Blank rows = not tested)   LOWER EXTREMITY MMT:     MMT Right Eval Left Eval  Hip flexion 4-    Hip extension      Hip abduction 4    Hip adduction 4    Hip internal rotation  Hip external rotation      Knee flexion 4-    Knee extension 3+    Ankle dorsiflexion 4-    Ankle plantarflexion 4-    Ankle inversion 4-    Ankle eversion 4-    (Blank rows = not tested)   TRANSFERS: Assistive device utilized: None  Sit to stand: Complete Independence Stand to sit: Complete Independence   STAIRS: Level of Assistance: SBA Stair Negotiation Technique: Alternating Pattern  with Single Rail on Right Number of Stairs: 4  Height of Stairs: 6    Comments: some impulsivity and unsafe with turns   GAIT: Gait pattern: WFL Distance walked: 60  feet Assistive device utilized: None Level of assistance: SBA Comments: some issues with turns and on uneven surfaces   FUNCTIONAL TESTS:  5 times sit to stand: 16, 12/13-18 Timed up and go (TUG): 13, 10.58- unsteady during both turns. Berg Balance Scale: 48/56, 12/13- 51/56   TODAY'S TREATMENT:     06/08/22 NuStep Step up on 4" on airex Leg ext HS curls Leg press Walking on beam  Calf raises   06/03/22 NuStep L3 x69mns Resisted gait 20# 4 way x4  Leg ext 5# 2x10 HS curls 20# 2x10  STS on airex 2x10 Side steps on airex Step ups from airex onto 6" step   05/13/22    NuStep L3 x 6 minutes Functional re-assessment for evaluation B side stepping on Airex beam, 5 x each direction. Runner's step ups onto airex pad with R leg x 10- min A with her hand for balance 4 square stepping clockwise, counter clockwise, then changing directions upon therapist's command. Gait assessment/training with increased speed, S x 150', more normalized gait pattern noted.                                                                                                    PATIENT EDUCATION: Education details: POC, updates Person educated: Patient  Education method: Explanation Education comprehension: verbalized understanding   HOME EXERCISE PROGRAM: Access Code: [email or web address] URL: [email or web address] Date: 06/08/2022 Prepared by: MAndris Baumann Exercises - Sit to Stand  - 1 x daily - 7 x weekly - 2 sets - 10 reps - Heel Raises with Counter Support  - 1 x daily - 7 x weekly - 2 sets - 10 reps - Seated Hip Abduction with Resistance  - 1 x daily - 7 x weekly - 2 sets - 10 reps - Standing 3-Way Leg Reach with Resistance at Ankles and Counter Support  - 1 x daily - 7 x weekly - 2 sets - 10 reps - Seated Hip Adduction Isometrics with Ball  - 1 x daily - 7 x weekly - 2 sets - 10 reps     GOALS: Goals reviewed with patient? Yes   SHORT TERM GOALS: Target date: 05/29/22   Independent  with initial HEP Goal status: MET   LONG TERM GOALS: Target date: 07/16/22   Increase Berg balance score to 52/56 Goal status: 12/13-51/56,o ngoing   2.  Understand safety and fall risks at home Goal status: MET  3.  Increase right LE strength to 4+/5 Goal status: in progress   4.  Walk >1000 feet uneven terrain without difficulty Goal status: MET   ASSESSMENT:   CLINICAL IMPRESSION: Patient states she feels like she does not need PT anymore. She has no issues with her legs or balance. We decided to create a HEP that she can continue at home. I will put her on hold for 2 wk in case she changes her mind about PT and wants to return.    OBJECTIVE IMPAIRMENTS: Abnormal gait, cardiopulmonary status limiting activity, decreased activity tolerance, decreased balance, decreased coordination, decreased endurance, decreased mobility, difficulty walking, decreased ROM, decreased strength, and decreased safety awareness.    REHAB POTENTIAL: Good   CLINICAL DECISION MAKING: Stable/uncomplicated   EVALUATION COMPLEXITY: mod   PLAN:   PT FREQUENCY: 2x/week   PT DURATION: 9 weeks   PLANNED INTERVENTIONS: Therapeutic exercises, Therapeutic activity, Neuromuscular re-education, Balance training, Gait training, Patient/Family education, Self Care, Joint mobilization, Stair training, and Manual therapy   PLAN FOR NEXT SESSION: Overall she is progressing well after stroke, she has the most deficits with her right hand and her speech, some deficits with strength and motions of the LE, some impulsivity and balance issues     Andris Baumann, DPT 06/09/22

## 2022-06-08 ENCOUNTER — Ambulatory Visit: Payer: 59 | Admitting: Rehabilitative and Restorative Service Providers"

## 2022-06-08 ENCOUNTER — Telehealth: Payer: Self-pay | Admitting: Neurology

## 2022-06-08 ENCOUNTER — Ambulatory Visit: Payer: 59

## 2022-06-08 DIAGNOSIS — M6281 Muscle weakness (generalized): Secondary | ICD-10-CM

## 2022-06-08 DIAGNOSIS — R600 Localized edema: Secondary | ICD-10-CM

## 2022-06-08 DIAGNOSIS — R262 Difficulty in walking, not elsewhere classified: Secondary | ICD-10-CM

## 2022-06-08 DIAGNOSIS — R278 Other lack of coordination: Secondary | ICD-10-CM

## 2022-06-08 NOTE — Telephone Encounter (Signed)
Pt sister is calling. Stated she needs to talk with Dr. Leonie Man about Pt because pt was told she needed to go see a hand surgeon because her hands are swelling and pt can't continue with PT. Enid Derry is requesting a call back from the nurse.

## 2022-06-09 ENCOUNTER — Ambulatory Visit: Payer: 59 | Admitting: Physical Therapy

## 2022-06-10 ENCOUNTER — Ambulatory Visit: Payer: 59 | Admitting: Rehabilitative and Restorative Service Providers"

## 2022-06-10 ENCOUNTER — Ambulatory Visit: Payer: 59 | Admitting: Physical Therapy

## 2022-06-15 ENCOUNTER — Ambulatory Visit: Payer: 59 | Admitting: Physical Therapy

## 2022-06-15 ENCOUNTER — Ambulatory Visit: Payer: 59 | Admitting: Rehabilitative and Restorative Service Providers"

## 2022-06-17 ENCOUNTER — Encounter: Payer: 59 | Attending: Physical Medicine and Rehabilitation | Admitting: Physical Medicine and Rehabilitation

## 2022-06-17 ENCOUNTER — Encounter: Payer: Self-pay | Admitting: Physical Medicine and Rehabilitation

## 2022-06-17 ENCOUNTER — Ambulatory Visit: Payer: 59 | Admitting: Rehabilitative and Restorative Service Providers"

## 2022-06-17 ENCOUNTER — Ambulatory Visit: Payer: 59 | Admitting: Physical Therapy

## 2022-06-17 VITALS — BP 201/102 | HR 84 | Ht 63.0 in | Wt 106.0 lb

## 2022-06-17 DIAGNOSIS — I10 Essential (primary) hypertension: Secondary | ICD-10-CM | POA: Insufficient documentation

## 2022-06-17 DIAGNOSIS — G8191 Hemiplegia, unspecified affecting right dominant side: Secondary | ICD-10-CM | POA: Diagnosis present

## 2022-06-17 DIAGNOSIS — M25511 Pain in right shoulder: Secondary | ICD-10-CM | POA: Diagnosis present

## 2022-06-17 DIAGNOSIS — R600 Localized edema: Secondary | ICD-10-CM | POA: Insufficient documentation

## 2022-06-17 NOTE — Assessment & Plan Note (Signed)
I have ordered an xray of your right shoulder to evaluate for arthritis. Once I get the results, I will call you and discuss whether or not a steroid injection would be warranted.

## 2022-06-17 NOTE — Assessment & Plan Note (Signed)
Ongoing severe HTN at clinic visits; asymptomatic  Advised to take BP medications ASAP and maintain compliance for next visit

## 2022-06-17 NOTE — Assessment & Plan Note (Addendum)
I have provided a script for a custom edema garment with [facility]. Call them to discuss potential costs if this is a concern.  Low suspicion for DVT based on today's exam; likely related to immobility d/t stroke, along with fluctuant volume from ESRD+HF; may be related to port but no other access offered by vascular at this time

## 2022-06-17 NOTE — Patient Instructions (Addendum)
I have ordered an xray of your right shoulder to evaluate for arthritis. Once I get the results, I will call you and discuss whether or not a steroid injection would be warranted.   Please have the therapist at the [facility] give me a call and we can discuss his workout program for your arm. You should be safe to attend so long as your blood pressure is well controlled, so please take your medications.  I have provided a script for a custom edema garment with [facility]. Call them to discuss costs if this is a concern.  Follow up as scheduled in February

## 2022-06-17 NOTE — Assessment & Plan Note (Addendum)
Due to cardioembolic stroke.   Graduated PT, OT with good results. Continues to have limited RUE functional hand use. Noncompliant with HEP.   Sister looking into program with [employer] to improve functional mobility; will call clinic when she has more information.   Follow up scheduled in February

## 2022-06-17 NOTE — Progress Notes (Signed)
Monique Henderson is a 72 y.o. female with PMHx ESRD on HD TTS, PAF on Eliquis, aortic dissection status postrepair, HFrEF, and refractory HTN who presents to PM&R clinic as a follow up for R hemiparesis s/p cardioembolic stroke,  IPR admission 9/28-10/8. She is seen today with her sister.   Plan from 04/06/22: ESRD on dialysis [facility]) Assessment & Plan: Referral sent to dietician to assist family in meal planning for renal/low phosphate diet Malignant hypertension Assessment & Plan: I highly recommend calling your PCP Dr. Dorna Mai to discuss your hypertension and further medication management, given persistent blood pressures >657 systolic despite recent medication adjustments. If you experience any symptoms concerning for recurrent stroke, please seek immediate attention in the ER.  Right hemiparesis [facility]) Assessment & Plan: I will reach out to your OT, Benito Mccreedy, regarding trying a compression glove for edema in your right hand. If this worsens, or if the hand becomes cool or darkly discolored, call our office immediately or seek emergency care for an ultrasound to evaluate for a blood clot.    I will look into possible repair services for your chair lift. Your family doctor has already received a request to order you a shower chair; if there are issues obtaining this, you can request a script be sent to our office as well.   Follow up in 3 months to review your progress with therapies and any functional needs moving forward.     Interval Hx:  - From Vascular visit 12/22: ".Marland KitchenMarland KitchenShe has had significant improvement and is able to move her right hand again.  She continues to have some speech issues, but per her and her daughter, is improving daily. Monique Henderson made it clear she was not interested in pursuing long-term HD access via fistula or graft."  - From OT DC summary 06/03/22: "... she now has increasing pain increasing swelling decreasing function and motion, and will be discharged as  conservative treatments have now completely failed.  She was cautioned that this is extremely dangerous and should follow-up with her doctors to determine a better course of action.  It may be possible that her dialysis port could be moved to her left arm, she may also benefit from an appointment with edema specialist for custom compressive garments to the right arm. "  - Pt states she is doing well, and sister corroborates this. She graduated PT and SLP recently, and is walking without an assistive device. Regarding her R hand, she uses the R hand occassionally for picking things up, functionally but is mostly noncompliant with HEP.   - She has had increasing pain in her right shoulder that limits her ROM and ability to use the limb functionally. Edema has increased in the hand whish causes some discomfort but is not as bad as the upper arm and shoulder. She wears an edema glove from OT daily, which helps. She discussed this with vascular who did not offer to move her port.   - Sister looking into program at [facility] for maintaining strength and UE ROM specifically for people with mobility deficits. She is unsure of the name of the program but will call clinic with more info.  - Re: ongoing HTN, sister states she is much better controlled on her current regimen, generally systolic 846N. She uses a pill box but does forget to take her medications sometimes, especially on appointment days. She is asymptomatic with current BP.   Pain Inventory Average Pain 8 Pain Right Now 8 My  pain is constant and sharp  LOCATION OF PAIN  neck, right shoulder, right hand, right fingers  BOWEL Number of stools per week: 7 Oral laxative use No   BLADDER Normal    Mobility how many minutes can you walk? No problem walking ability to climb steps?  yes do you drive?  no Do you have any goals in this area?  yes  Function disabled: date disabled Disable for years Do you have any goals in this  area?  yes  Neuro/Psych No problems in this area  Prior Studies Any changes since last visit?  no  Physicians involved in your care Any changes since last visit?  no   Family History  Problem Relation Age of Onset   Colon cancer Mother 54   Hypertension Mother    Diabetes Sister 11   Hypertension Sister    Diabetes Brother    Hypertension Brother    Diabetes Brother    Hypertension Brother    Kidney disease Son        s/p renal transplant   Hypertension Son    Diabetes Son    Multiple sclerosis Son    Bone cancer Sister 11   Breast cancer Neg Hx    Cervical cancer Neg Hx    Social History   Socioeconomic History   Marital status: Single    Spouse name: Not on file   Number of children: 2   Years of education: Not on file   Highest education level: Not on file  Occupational History   Occupation: disabled  Tobacco Use   Smoking status: Former    Packs/day: 0.25    Years: 44.00    Total pack years: 11.00    Types: Cigarettes    Quit date: 2022    Years since quitting: 2.0   Smokeless tobacco: Never   Tobacco comments:    smoking less  Vaping Use   Vaping Use: Never used  Substance and Sexual Activity   Alcohol use: Not Currently    Alcohol/week: 2.0 standard drinks of alcohol    Types: 2 Cans of beer per week    Comment: "couple beers a weekend", not currently   Drug use: Not Currently    Types: Cocaine    Comment: last in 2005   Sexual activity: Yes    Birth control/protection: Post-menopausal  Other Topics Concern   Not on file  Social History Narrative   Not on file   Social Determinants of Health   Financial Resource Strain: Not on file  Food Insecurity: No Food Insecurity (04/30/2022)   Hunger Vital Sign    Worried About Running Out of Food in the Last Year: Never true    Ran Out of Food in the Last Year: Never true  Transportation Needs: No Transportation Needs (04/30/2022)   PRAPARE - Hydrologist (Medical):  No    Lack of Transportation (Non-Medical): No  Physical Activity: Not on file  Stress: Not on file  Social Connections: Not on file   Past Surgical History:  Procedure Laterality Date   ANTERIOR LAT LUMBAR FUSION N/A 01/18/2014   Procedure: ANTERIOR LATERAL LUMBAR FUSION LUMBAR TWO-THREE;  Surgeon: Eustace Moore, MD;  Location: [place] [facility];  Service: Neurosurgery;  Laterality: N/A;   ANTERIOR LUMBAR FUSION  01/18/2014   AV FISTULA PLACEMENT Left 04/20/2019   Procedure: ARTERIOVENOUS (AV) FISTULA CREATION;  Surgeon: [name], MD;  Location: [place];  Service: Vascular;  Laterality:  Left;   BACK SURGERY     BREAST LUMPECTOMY Left 01/2003   BREAST LUMPECTOMY Right 2014   BREAST LUMPECTOMY WITH NEEDLE LOCALIZATION AND AXILLARY SENTINEL LYMPH NODE BX Right 10/13/2012   Procedure: BREAST LUMPECTOMY WITH NEEDLE LOCALIZATION;  Surgeon: [name], MD;  Location: [place];  Service: General;  Laterality: Right;  Right breast wire localized lumpectomy   INSERTION OF DIALYSIS CATHETER Right 04/20/2019   Procedure: INSERTION OF DIALYSIS CATHETER, right internal jugular;  Surgeon: [name], MD;  Location: [place];  Service: Vascular;  Laterality: Right;   INSERTION OF DIALYSIS CATHETER Right 09/24/2020   Procedure: INSERTION OF TUNNELED DIALYSIS CATHETER;  Surgeon: Rosetta Posner, MD;  Location: [place];  Service: Vascular;  Laterality: Right;   IR FLUORO GUIDE CV LINE RIGHT  09/22/2020   IR THORACENTESIS ASP PLEURAL SPACE W/IMG GUIDE  05/19/2019   IR US GUIDE VASC ACCESS LEFT  09/22/2020   IR US GUIDE VASC ACCESS RIGHT  09/22/2020   IR VENOCAVAGRAM SVC  09/22/2020   LAMINECTOMY  05/27/2009   Lumbar decompressive laminectomy, fusion and plating for lumbar spinal stensosis   LIGATION OF ARTERIOVENOUS  FISTULA Left 09/24/2020   Procedure: LIGATION OF LEFT ARM ARTERIOVENOUS  FISTULA;  Surgeon: Rosetta Posner, MD;  Location: [place];  Service: Vascular;  Laterality:  Left;   LUMBAR LAMINECTOMY/DECOMPRESSION MICRODISCECTOMY Left 03/23/2013   Procedure: LUMBAR LAMINECTOMY/DECOMPRESSION MICRODISCECTOMY 1 LEVEL;  Surgeon: Eustace Moore, MD;  Location: [facility];  Service: Neurosurgery;  Laterality: Left;  LUMBAR LAMINECTOMY/DECOMPRESSION MICRODISCECTOMY 1 LEVEL   MASTECTOMY, PARTIAL Left 02/26/2003   ; S/P re-excision of cranial and lateral margins 04/19/2003.    RE-EXCISION OF BREAST CANCER,SUPERIOR MARGINS Right 10/27/2012   Procedure: RE-EXCISION OF BREAST CANCER,SUPERIOR and inferior MARGINS;  Surgeon: [name], MD;  Location: [place];  Service: General;  Laterality: Right;   RE-EXCISION OF BREAST LUMPECTOMY Left 04/2003   TEE WITHOUT CARDIOVERSION N/A 04/04/2019   Procedure: Transesophageal Echocardiogram (Tee);  Surgeon: Wonda Olds, MD;  Location: [place];  Service: Open Heart Surgery;  Laterality: N/A;   THORACIC AORTIC ANEURYSM REPAIR N/A 04/04/2019   Procedure: THORACIC ASCENDING ANEURYSM REPAIR (AAA)  USING 28 MM X 30 CM HEMASHIELD PLATINUM VASCULAR GRAFT;  Surgeon: Wonda Olds, MD;  Location: [facility];  Service: Open Heart Surgery;  Laterality: N/A;   Past Medical History:  Diagnosis Date   AF (paroxysmal atrial fibrillation) ([place]) 05/29/2019   on Coumadin   Aortic atherosclerosis ([place]) 07/05/2019   Aortic dissection ([place]) 04/04/2019   s/p repair   Bone spur 2008   Right calcaneal foot spur   Breast cancer ([place]) 2004   Ductal carcinoma in situ of the left breast; S/P left partial mastectomy 02/26/2003; S/P re-excision of cranial and lateral margins11/18/2004.radiation   Carotid artery disease (HCC)    Cerebral thrombosis with cerebral infarction 05/22/2019   Chronic HFrEF (heart failure with reduced ejection fraction) (HCC)    Chronic low back pain 06/22/2016   Chronic obstructive lung disease ([place]) 01/16/2017   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 Autumn Messing; S/P  re-excision of superior and inferior margins 10/27/2012.    ESRD on hemodialysis ([place]) 05/29/2019   Essential hypertension 09/16/2006   GERD 09/16/2006   Hepatitis C    treated and RNA confirmed not detectable 01/2017   Insomnia 03/14/2015   Malnutrition of moderate degree 05/19/2019  Non compliance with medical treatment 12/04/2017   Normocytic anemia    With thrombocytosis   Osteoarthritis    Right ureteral stone 2002   S/P lumbar spinal fusion 01/18/2014   S/P lumbar decompressive laminectomy, fusion, and plating for lumbar spinal stenosis on 05/27/2009 by Dr. Eustace Moore.  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 Eustace Moore.     Stroke (cerebrum) ([place])    Tobacco use disorder 04/19/2009   Uterine fibroid    Wears dentures    top   There were no vitals taken for this visit.  Opioid Risk Score:   Fall Risk Score:  `1  Depression screen [facility] 2/9     05/22/2022   10:25 AM 04/20/2022    1:20 PM 04/06/2022   10:32 AM 02/14/2021    8:02 AM 05/13/2020   10:01 AM 11/30/2019    3:28 PM 11/02/2019    3:35 PM  Depression screen PHQ 2/9  Decreased Interest 2 3 0 3 0 0 0  Down, Depressed, Hopeless 0 0 1 2 0 0 0  PHQ - 2 Score '2 3 1 5 '$ 0 0 0  Altered sleeping '1 3 1 3     '$ Tired, decreased energy 1 0 1 3     Change in appetite 0 2 0 3     Feeling bad or failure about yourself  1 0 0 3     Trouble concentrating 1 0 1 2     Moving slowly or fidgety/restless 1 2 0 2     Suicidal thoughts 0 0 0 1     PHQ-9 Score '7 10 4 22     '$ Difficult doing work/chores Not difficult at all Somewhat difficult  Extremely dIfficult       ROS: Review of Systems  Constitutional:  Negative for chills, fever and weight loss.  Respiratory:  Negative for cough.   Cardiovascular:  Negative for chest pain and leg  swelling.  Gastrointestinal:  Negative for abdominal pain.  Musculoskeletal:  Negative for falls and myalgias.  Neurological:  Positive for speech change and focal weakness. Negative for dizziness, sensory change, seizures, loss of consciousness and weakness.  Psychiatric/Behavioral:  Negative for suicidal ideas.      Exam: Constitution: Appropriate appearance for age. No apparnet distress. +underweight HEENT: PERRL, EOMI grossly intact.  Resp: CTAB. No rales, rhonchi, or wheezing. Cardio: RRR. No mumurs, rubs, or gallops.  Abdomen: Nondistended. Nontender. +bowel sounds. Psych: Appropriate mood and affect.  Neuro: Mild cognitive deficits. AAOx3.   +R facial droop., + R tongue deviation, + R shoulder weakness.   +Dysarthria. LUE and LLE 5/5 RUE SA 4+/5, EF 4/5, ee 4/5, WE 3-/5, FF 2+/5, FA 2-/5 RLE 4+/5 hip flexion, 5-/5 knee extension, 5/5 DF, 5/5 PF  MSK: 3+ pitting edema in R hand  extending to elbow. Capillary refill brisk. 2+ radial and ulnar distal pulses. No discoloration.   Gross muscle atrophy around the R shoulder girdle and biceps. No palpable shoulder subluxation. TTP anteriorly and posteriorly at joint. R shoulder AROM limited in passive abduction and internal rotation. Negative drop arm. Complete PROM. No spasticity.   Assessment and plan: Monique Henderson is a 72 y.o. female with PMHx ESRD on HD TTS, PAF on Eliquis, aortic dissection status postrepair, HFrEF, and refractory HTN who presents to PM&R clinic  as a follow up for R hemiparesis s/p cardioembolic stroke,  IPR admission 9/28-10/8. She is seen today with her sister.   Ongoing deficits include R hemiparesis UE>LE, neurogenic edema RUE, dysarthria, R shoulder pain.   Right hemiparesis [facility]) Assessment & Plan: Due to cardioembolic stroke.   Graduated PT, OT with good results. Continues to have limited RUE functional hand use. Noncompliant with HEP.   Sister looking into program with [employer] to improve  functional mobility; will call clinic when she has more information.   Follow up scheduled in February   Malignant hypertension Assessment & Plan: Ongoing severe HTN at clinic visits; asymptomatic  Advised to take BP medications ASAP and maintain compliance for next visit   Right shoulder pain, unspecified chronicity Assessment & Plan: I have ordered an xray of your right shoulder to evaluate for arthritis. Once I get the results, I will call you and discuss whether or not a steroid injection would be warranted.   Orders: -     DG Shoulder Right; Future  Edema of right upper arm Assessment & Plan: I have provided a script for a custom edema garment with [place] clinic. Call them to discuss potential costs if this is a concern.  Low suspicion for DVT based on today's exam; likely related to immobility d/t stroke, along with fluctuant volume from ESRD+HF; may be related to port but no other access offered by vascular at this time      Gertie Gowda, DO 06/17/2022

## 2022-06-19 ENCOUNTER — Telehealth: Payer: Self-pay

## 2022-06-19 ENCOUNTER — Ambulatory Visit (HOSPITAL_COMMUNITY): Payer: 59 | Attending: Cardiovascular Disease

## 2022-06-19 DIAGNOSIS — I5022 Chronic systolic (congestive) heart failure: Secondary | ICD-10-CM | POA: Diagnosis present

## 2022-06-19 LAB — ECHOCARDIOGRAM COMPLETE
Area-P 1/2: 5.97 cm2
S' Lateral: 2.8 cm

## 2022-06-19 NOTE — Telephone Encounter (Signed)
-----  Message from Charlie Pitter, [place] sent at 06/19/2022  2:52 PM EST ----- Please let pt know that heart pump function slightly improved from prior, from 30-35% to 45-50%. The heart muscle is still very thick. The reader raises question of a condition called amyloid. Given her recent constellation of issues including ESRD missing HD this makes her less ideal candidate for cMRI. Briefly discussed with Dr. Marlou Porch since she is a former Dr. [name] patient - recommend keeping f/u with Dr. Irish Lack as scheduled coming in the next few weeks to discuss study further +/- any additional steps.

## 2022-06-19 NOTE — Telephone Encounter (Signed)
Left message for return call.

## 2022-06-29 ENCOUNTER — Ambulatory Visit: Payer: Medicare Other | Admitting: Dietician

## 2022-07-01 ENCOUNTER — Ambulatory Visit
Admission: RE | Admit: 2022-07-01 | Discharge: 2022-07-01 | Disposition: A | Discharge status: Home / Self Care | Payer: 59 | Source: Ambulatory Visit | Attending: Family Medicine | Admitting: Family Medicine

## 2022-07-01 DIAGNOSIS — Z1231 Encounter for screening mammogram for malignant neoplasm of breast: Secondary | ICD-10-CM

## 2022-07-02 NOTE — Telephone Encounter (Signed)
The patient has been notified of the result and verbalized understanding.  All questions (if any) were answered. Bernestine Amass, RN 07/02/2022 4:23 PM

## 2022-07-04 ENCOUNTER — Emergency Department (HOSPITAL_COMMUNITY)
Admission: EM | Admit: 2022-07-04 | Discharge: 2022-07-05 | Disposition: A | Discharge status: Home / Self Care | Payer: 59 | Attending: Emergency Medicine | Admitting: Emergency Medicine

## 2022-07-04 ENCOUNTER — Emergency Department (HOSPITAL_COMMUNITY): Payer: 59

## 2022-07-04 ENCOUNTER — Encounter (HOSPITAL_COMMUNITY): Payer: Self-pay | Admitting: Emergency Medicine

## 2022-07-04 ENCOUNTER — Other Ambulatory Visit: Payer: Self-pay

## 2022-07-04 DIAGNOSIS — N186 End stage renal disease: Secondary | ICD-10-CM | POA: Insufficient documentation

## 2022-07-04 DIAGNOSIS — F1410 Cocaine abuse, uncomplicated: Secondary | ICD-10-CM

## 2022-07-04 DIAGNOSIS — I10 Essential (primary) hypertension: Secondary | ICD-10-CM

## 2022-07-04 DIAGNOSIS — I120 Hypertensive chronic kidney disease with stage 5 chronic kidney disease or end stage renal disease: Secondary | ICD-10-CM | POA: Insufficient documentation

## 2022-07-04 DIAGNOSIS — E875 Hyperkalemia: Secondary | ICD-10-CM

## 2022-07-04 LAB — COMPREHENSIVE METABOLIC PANEL
ALT: 16 U/L (ref 0–44)
AST: 37 U/L (ref 15–41)
Albumin: 4.1 g/dL (ref 3.5–5.0)
Alkaline Phosphatase: 76 U/L (ref 38–126)
Anion gap: 20 — ABNORMAL HIGH (ref 5–15)
BUN: 86 mg/dL — ABNORMAL HIGH (ref 8–23)
CO2: 22 mmol/L (ref 22–32)
Calcium: 9.6 mg/dL (ref 8.9–10.3)
Chloride: 98 mmol/L (ref 98–111)
Creatinine, Ser: 14.52 mg/dL — ABNORMAL HIGH (ref 0.44–1.00)
GFR, Estimated: 2 mL/min — ABNORMAL LOW (ref >60)
Glucose, Bld: 108 mg/dL — ABNORMAL HIGH (ref 70–99)
Potassium: 6.8 mmol/L (ref 3.5–5.1)
Sodium: 140 mmol/L (ref 135–145)
Total Bilirubin: 0.5 mg/dL (ref 0.3–1.2)
Total Protein: 7.5 g/dL (ref 6.5–8.1)

## 2022-07-04 LAB — CBC
HCT: 38.5 % (ref 36.0–46.0)
Hemoglobin: 12 g/dL (ref 12.0–15.0)
MCH: 30 pg (ref 26.0–34.0)
MCHC: 31.2 g/dL (ref 30.0–36.0)
MCV: 96.3 fL (ref 80.0–100.0)
Platelets: 237 10*3/uL (ref 150–400)
RBC: 4 MIL/uL (ref 3.87–5.11)
RDW: 17.6 % — ABNORMAL HIGH (ref 11.5–15.5)
WBC: 4.8 10*3/uL (ref 4.0–10.5)
nRBC: 0 % (ref 0.0–0.2)

## 2022-07-04 MED ORDER — SODIUM BICARBONATE 8.4 % IV SOLN
50.0000 meq | Freq: Once | INTRAVENOUS | Status: AC
  Administered 2022-07-04: 50 meq via INTRAVENOUS
  Filled 2022-07-04: qty 50

## 2022-07-04 MED ORDER — ALTEPLASE 2 MG IJ SOLR: 2.0000 mg | Freq: Once | INTRAMUSCULAR | Status: DC | PRN

## 2022-07-04 MED ORDER — HEPARIN SODIUM (PORCINE) 1000 UNIT/ML DIALYSIS
1000.0000 [IU] | INTRAMUSCULAR | Status: DC | PRN
  Filled 2022-07-04: qty 1

## 2022-07-04 MED ORDER — DIAZEPAM 5 MG/ML IJ SOLN
2.5000 mg | Freq: Once | INTRAMUSCULAR | Status: AC
  Administered 2022-07-04: 2.5 mg via INTRAVENOUS
  Filled 2022-07-04: qty 2

## 2022-07-04 MED ORDER — LIDOCAINE-PRILOCAINE 2.5-2.5 % EX CREA: 1.0000 | TOPICAL_CREAM | CUTANEOUS | Status: DC | PRN

## 2022-07-04 MED ORDER — CALCIUM GLUCONATE 10 % IV SOLN
1.0000 g | Freq: Once | INTRAVENOUS | Status: AC
  Administered 2022-07-04: 1 g via INTRAVENOUS
  Filled 2022-07-04: qty 10

## 2022-07-04 MED ORDER — LIDOCAINE HCL (PF) 1 % IJ SOLN: 5.0000 mL | INTRAMUSCULAR | Status: DC | PRN

## 2022-07-04 MED ORDER — PENTAFLUOROPROP-TETRAFLUOROETH EX AERO: 1.0000 | INHALATION_SPRAY | CUTANEOUS | Status: DC | PRN

## 2022-07-04 MED ORDER — INSULIN ASPART 100 UNIT/ML IJ SOLN
6.0000 [IU] | Freq: Once | INTRAMUSCULAR | Status: AC
  Administered 2022-07-04: 6 [IU] via INTRAVENOUS

## 2022-07-04 MED ORDER — ANTICOAGULANT SODIUM CITRATE 4% (200MG/5ML) IV SOLN: 5.0000 mL | Status: DC | PRN

## 2022-07-04 MED ORDER — HYDRALAZINE HCL 20 MG/ML IJ SOLN
10.0000 mg | Freq: Once | INTRAMUSCULAR | Status: AC
  Administered 2022-07-04: 10 mg via INTRAVENOUS
  Filled 2022-07-04: qty 1

## 2022-07-04 MED ORDER — DEXTROSE 50 % IV SOLN
25.0000 g | Freq: Once | INTRAVENOUS | Status: AC
  Administered 2022-07-04: 25 g via INTRAVENOUS
  Filled 2022-07-04: qty 50

## 2022-07-04 MED ORDER — SODIUM ZIRCONIUM CYCLOSILICATE 10 G PO PACK
10.0000 g | PACK | Freq: Once | ORAL | Status: AC
  Administered 2022-07-04: 10 g via ORAL
  Filled 2022-07-04: qty 1

## 2022-07-04 NOTE — Consult Note (Signed)
Reason for Consult: ESRD  Referring Physician:  Dr. Ashok Cordia  Chief Complaint: Anxious about missing dialysis  Dialysis Orders: TTS at [facility] 3:30 hrs [id number]/Autoflow 1.5 48.5 kg 2.0 K/ 2.0 Ca UFP 2 TDC no heparin - No ESA/Venofer - Hectorol 5 mcg IV TIW  Assessment/Plan: H/o Acute cortical CVA: Dysarthric, R arm weakness.   HTN: restart home regimen; UF will help as well Volume - left above her EDW on Tues but has not reached her EDW, possibly bec of missed treatments. PAF: On Eliquis (restarted 9/28) 5.  ESRD: Continue HD on TTS; Next HD toda emergently, 1K 1st hour. 6.  Anemia: Hgb 12, no ESA needed. 7.  Metabolic bone disease: Manage binders as outpt. 8.  Nutrition - Albumin 4.1. Renal diet w/fluid restrictions. Protein supplements.     HPI: Monique Henderson is an 72 y.o. female h/o CVA with residual right arm weakness and dysarthria, PAF, HTN, ESRD TTS @ [facility] last HD on Tuesday  leaving above her EDW. She missed treatments on Thur and Sat because she was smoking crack cocaine. She then became anxious about missing dialysis. She has chest pressure and shortness of breath. She denies fever, chills, nausea.  ROS Pertinent items are noted in HPI.  Chemistry and CBC: Creatinine  Date/Time Value Ref Range Status  09/28/2012 08:31 AM 1.2 (H) 0.6 - 1.1 mg/dL Final   Creat  Date/Time Value Ref Range Status  01/18/2017 11:45 AM 2.09 (H) 0.50 - 0.99 mg/dL Final    Comment:      For patients > or = 70 years of age: The upper reference limit for Creatinine is approximately 13% higher for people identified as African-American.     12/09/2016 02:58 PM 2.23 (H) 0.50 - 0.99 mg/dL Final    Comment:      For patients > or = 70 years of age: The upper reference limit for Creatinine is approximately 13% higher for people identified as African-American.     02/14/2014 09:51 AM 1.24 (H) 0.50 - 1.10 mg/dL Final  08/28/2013 10:21 AM 1.52 (H) 0.50 - 1.10 mg/dL Final  02/22/2013  11:11 AM 1.33 (H) 0.50 - 1.10 mg/dL Final  12/21/2012 09:57 AM 1.38 (H) 0.50 - 1.10 mg/dL Final  10/12/2012 12:13 PM 2.44 (H) 0.50 - 1.10 mg/dL Final  04/20/2012 12:20 PM 1.66 (H) 0.50 - 1.10 mg/dL Final  02/25/2011 09:46 AM 1.43 (H) 0.50 - 1.10 mg/dL Final   Creatinine, Ser  Date/Time Value Ref Range Status  07/04/2022 05:15 PM 14.52 (H) 0.44 - 1.00 mg/dL Final  05/01/2022 03:50 AM 5.26 (H) 0.44 - 1.00 mg/dL Final  04/30/2022 06:21 AM 6.53 (H) 0.44 - 1.00 mg/dL Final    Comment:    DIALYSIS  04/29/2022 08:39 PM 15.48 (H) 0.44 - 1.00 mg/dL Final  03/07/2022 02:52 PM 9.82 (H) 0.44 - 1.00 mg/dL Final  03/05/2022 05:23 AM 10.18 (H) 0.44 - 1.00 mg/dL Final  03/04/2022 07:05 AM 7.38 (H) 0.44 - 1.00 mg/dL Final  03/03/2022 05:09 AM 11.35 (H) 0.44 - 1.00 mg/dL Final  03/01/2022 09:59 AM 7.22 (H) 0.44 - 1.00 mg/dL Final  02/27/2022 10:28 AM 7.92 (H) 0.44 - 1.00 mg/dL Final    Comment:    DELTA CHECK NOTED  02/26/2022 02:56 PM 4.56 (H) 0.44 - 1.00 mg/dL Final    Comment:    DIALYSIS DELTA CHECK NOTED   02/24/2022 04:11 AM 10.03 (H) 0.44 - 1.00 mg/dL Final  02/23/2022 02:44 PM 8.96 (H) 0.44 - 1.00 mg/dL Final  01/30/2022 07:20 AM 3.74 (H) 0.44 - 1.00 mg/dL Final    Comment:    DIALYSIS DELTA CHECK NOTED   01/29/2022 11:31 AM 8.80 (H) 0.44 - 1.00 mg/dL Final  01/29/2022 11:28 AM 7.97 (H) 0.44 - 1.00 mg/dL Final  01/08/2022 09:44 AM 9.06 (H) 0.44 - 1.00 mg/dL Final  01/07/2022 03:49 AM 6.44 (H) 0.44 - 1.00 mg/dL Final  01/06/2022 07:09 PM 5.13 (H) 0.44 - 1.00 mg/dL Final    Comment:    DELTA CHECK NOTED  01/06/2022 10:46 AM 12.75 (H) 0.44 - 1.00 mg/dL Final  12/29/2021 06:50 AM 10.08 (H) 0.44 - 1.00 mg/dL Final  12/28/2021 04:43 AM 6.83 (H) 0.44 - 1.00 mg/dL Final    Comment:    DELTA CHECK NOTED  12/27/2021 12:54 AM 10.32 (H) 0.44 - 1.00 mg/dL Final  12/26/2021 06:02 AM 8.11 (H) 0.44 - 1.00 mg/dL Final  12/25/2021 01:57 PM 6.22 (H) 0.44 - 1.00 mg/dL Final    Comment:    PATIENT  WENT TO DIALYSIS  12/25/2021 01:45 AM 14.47 (H) 0.44 - 1.00 mg/dL Final  12/24/2021 01:53 PM 14.70 (H) 0.44 - 1.00 mg/dL Final  12/24/2021 01:41 PM 13.37 (H) 0.44 - 1.00 mg/dL Final  12/08/2021 04:04 AM 11.40 (H) 0.44 - 1.00 mg/dL Final  12/07/2021 07:57 PM 11.90 (H) 0.44 - 1.00 mg/dL Final  12/07/2021 07:48 PM 10.98 (H) 0.44 - 1.00 mg/dL Final  11/18/2021 07:31 PM 4.27 (H) 0.44 - 1.00 mg/dL Final    Comment:    DELTA CHECK NOTED  11/18/2021 07:24 AM 10.60 (H) 0.44 - 1.00 mg/dL Final  11/18/2021 07:00 AM 9.49 (H) 0.44 - 1.00 mg/dL Final  10/30/2021 05:30 AM 9.33 (H) 0.44 - 1.00 mg/dL Final  10/29/2021 08:57 AM 12.40 (H) 0.44 - 1.00 mg/dL Final  10/29/2021 08:40 AM 11.77 (H) 0.44 - 1.00 mg/dL Final  09/15/2021 04:19 AM 6.74 (H) 0.44 - 1.00 mg/dL Final  09/15/2021 12:01 AM 6.48 (H) 0.44 - 1.00 mg/dL Final  09/07/2021 02:15 PM 12.34 (H) 0.44 - 1.00 mg/dL Final  08/24/2021 03:14 AM 3.97 (H) 0.44 - 1.00 mg/dL Final    Comment:    DELTA CHECK NOTED  08/23/2021 01:25 AM 6.59 (H) 0.44 - 1.00 mg/dL Final  08/16/2021 04:17 AM 9.49 (H) 0.44 - 1.00 mg/dL Final  08/15/2021 06:49 AM 7.71 (H) 0.44 - 1.00 mg/dL Final  08/14/2021 04:20 AM 10.52 (H) 0.44 - 1.00 mg/dL Final  08/13/2021 07:32 AM 8.79 (H) 0.44 - 1.00 mg/dL Final  08/13/2021 12:19 AM 6.72 (H) 0.44 - 1.00 mg/dL Final    Comment:    DELTA CHECK NOTED  08/12/2021 08:34 PM 19.09 (H) 0.44 - 1.00 mg/dL Final  08/12/2021 12:09 PM 19.69 (H) 0.44 - 1.00 mg/dL Final    Comment:    DIALYSIS  07/15/2021 08:29 PM 9.39 (H) 0.44 - 1.00 mg/dL Final  06/21/2021 03:10 PM 9.52 (H) 0.44 - 1.00 mg/dL Final  06/20/2021 02:19 AM 6.65 (H) 0.44 - 1.00 mg/dL Final    Comment:    DELTA CHECK NOTED   Recent Labs  Lab 07/04/22 1715  NA 140  K 6.8*  CL 98  CO2 22  GLUCOSE 108*  BUN 86*  CREATININE 14.52*  CALCIUM 9.6   Recent Labs  Lab 07/04/22 1715  WBC 4.8  HGB 12.0  HCT 38.5  MCV 96.3  PLT 237   Liver Function Tests: Recent Labs  Lab  07/04/22 1715  AST 37  ALT 16  ALKPHOS 76  BILITOT 0.5  PROT 7.5  ALBUMIN 4.1   No results for input(s): "LIPASE", "AMYLASE" in the last 168 hours. No results for input(s): "AMMONIA" in the last 168 hours. Cardiac Enzymes: No results for input(s): "CKTOTAL", "CKMB", "CKMBINDEX", "TROPONINI" in the last 168 hours. Iron Studies: No results for input(s): "IRON", "TIBC", "TRANSFERRIN", "FERRITIN" in the last 72 hours. PT/INR: '@LABRCNTIP'$ (inr:5)  Xrays/Other Studies: ) Results for orders placed or performed during the hospital encounter of 07/04/22 (from the past 48 hour(s))  CBC     Status: Abnormal   Collection Time: 07/04/22  5:15 PM  Result Value Ref Range   WBC 4.8 4.0 - 10.5 K/uL   RBC 4.00 3.87 - 5.11 MIL/uL   Hemoglobin 12.0 12.0 - 15.0 g/dL   HCT 38.5 36.0 - 46.0 %   MCV 96.3 80.0 - 100.0 fL   MCH 30.0 26.0 - 34.0 pg   MCHC 31.2 30.0 - 36.0 g/dL   RDW 17.6 (H) 11.5 - 15.5 %   Platelets 237 150 - 400 K/uL   nRBC 0.0 0.0 - 0.2 %    Comment: Performed at [facility], [place] [address], [place], [place] [address]  Comprehensive metabolic panel     Status: Abnormal   Collection Time: 07/04/22  5:15 PM  Result Value Ref Range   Sodium 140 135 - 145 mmol/L   Potassium 6.8 (HH) 3.5 - 5.1 mmol/L    Comment: CRITICAL RESULT CALLED TO, READ BACK BY AND VERIFIED WITH C,BAIN RN '@[id number]'$  07/04/22 E,[place] SLIGHT HEMOLYSIS    Chloride 98 98 - 111 mmol/L   CO2 22 22 - 32 mmol/L   Glucose, Bld 108 (H) 70 - 99 mg/dL    Comment: Glucose reference range applies only to samples taken after fasting for at least 8 hours.   BUN 86 (H) 8 - 23 mg/dL   Creatinine, Ser 14.52 (H) 0.44 - 1.00 mg/dL   Calcium 9.6 8.9 - 10.3 mg/dL   Total Protein 7.5 6.5 - 8.1 g/dL   Albumin 4.1 3.5 - 5.0 g/dL   AST 37 15 - 41 U/L   ALT 16 0 - 44 U/L   Alkaline Phosphatase 76 38 - 126 U/L   Total Bilirubin 0.5 0.3 - 1.2 mg/dL   GFR, Estimated 2 (L) >60 mL/min    Comment: (NOTE) Calculated using the CKD-EPI  Creatinine Equation (2021)    Anion gap 20 (H) 5 - 15    Comment: Performed at [facility] [address] [place], [place] [address]   *Note: Due to a large number of results and/or encounters for the requested time period, some results have not been displayed. A complete set of results can be found in Results Review.   No results found.  PMH:   Past Medical History:  Diagnosis Date   AF (paroxysmal atrial fibrillation) ([place]) 05/29/2019   on Coumadin   Aortic atherosclerosis ([place]) 07/05/2019   Aortic dissection ([place]) 04/04/2019   s/p repair   Bone spur 2008   Right calcaneal foot spur   Breast cancer ([place]) 2004   Ductal carcinoma in situ of the left breast; S/P left partial mastectomy 02/26/2003; S/P re-excision of cranial and lateral margins11/18/2004.radiation   Carotid artery disease (H[facility])    Cerebral thrombosis with cerebral infarction 05/22/2019   Chronic HFrEF (heart failure with reduced ejection fraction) (HCC)    Chronic low back pain 06/22/2016   Chronic obstructive lung disease ([place]) 01/16/2017   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 Autumn Messing; S/P re-excision of superior and inferior margins 10/27/2012.    ESRD on hemodialysis ([place]) 05/29/2019   Essential hypertension 09/16/2006   GERD 09/16/2006   Hepatitis C    treated and RNA confirmed not detectable 01/2017   Insomnia 03/14/2015   Malnutrition of moderate degree 05/19/2019   Non compliance with medical treatment 12/04/2017   Normocytic anemia    With thrombocytosis   Osteoarthritis    Right ureteral stone 2002   S/P lumbar spinal fusion 01/18/2014   S/P lumbar decompressive laminectomy, fusion, and plating for lumbar spinal stenosis on 05/27/2009 by Dr. Eustace Moore.  S/P anterolateral retroperitoneal interbody fusion L2-3 utilizing a 8 mm peek interbody cage packed with morcellized allograft, and anterior lumbar plating L2-3 for recurrent  disc herniation L2-3 with spinal stenosis on 01/18/2014 by Dr. Eustace Moore.     Stroke (cerebrum) ([place])    Tobacco use disorder 04/19/2009   Uterine fibroid    Wears dentures    top    PSH:   Past Surgical History:  Procedure Laterality Date   ANTERIOR LAT LUMBAR FUSION N/A 01/18/2014   Procedure: ANTERIOR LATERAL LUMBAR FUSION LUMBAR TWO-THREE;  Surgeon: Eustace Moore, MD;  Location: [facility];  Service: Neurosurgery;  Laterality: N/A;   ANTERIOR LUMBAR FUSION  01/18/2014   AV FISTULA PLACEMENT Left 04/20/2019   Procedure: ARTERIOVENOUS (AV) FISTULA CREATION;  Surgeon: [name], MD;  Location: [facility];  Service: Vascular;  Laterality: Left;   BACK SURGERY     BREAST LUMPECTOMY Left 01/2003   BREAST LUMPECTOMY Right 2014   BREAST LUMPECTOMY WITH NEEDLE LOCALIZATION AND AXILLARY SENTINEL LYMPH NODE BX Right 10/13/2012   Procedure: BREAST LUMPECTOMY WITH NEEDLE LOCALIZATION;  Surgeon: [name], MD;  Location: [facility];  Service: General;  Laterality: Right;  Right breast wire localized lumpectomy   INSERTION OF DIALYSIS CATHETER Right 04/20/2019   Procedure: INSERTION OF DIALYSIS CATHETER, right internal jugular;  Surgeon: [name], MD;  Location: [facility];  Service: Vascular;  Laterality: Right;   INSERTION OF DIALYSIS CATHETER Right 09/24/2020   Procedure: INSERTION OF TUNNELED DIALYSIS CATHETER;  Surgeon: Rosetta Posner, MD;  Location: [facility];  Service: Vascular;  Laterality: Right;   IR FLUORO GUIDE CV LINE RIGHT  09/22/2020   IR THORACENTESIS ASP PLEURAL SPACE W/IMG GUIDE  05/19/2019   IR US GUIDE VASC ACCESS LEFT  09/22/2020   IR US GUIDE VASC ACCESS RIGHT  09/22/2020   IR VENOCAVAGRAM SVC  09/22/2020   LAMINECTOMY  05/27/2009   Lumbar decompressive laminectomy, fusion and plating for lumbar spinal stensosis   LIGATION OF ARTERIOVENOUS  FISTULA Left 09/24/2020   Procedure: LIGATION OF LEFT ARM ARTERIOVENOUS  FISTULA;  Surgeon: Rosetta Posner, MD;  Location: [facility];  Service: Vascular;  Laterality: Left;   LUMBAR LAMINECTOMY/DECOMPRESSION MICRODISCECTOMY Left 03/23/2013   Procedure: LUMBAR LAMINECTOMY/DECOMPRESSION MICRODISCECTOMY 1 LEVEL;  Surgeon: Eustace Moore, MD;  Location: [facility];  Service: Neurosurgery;  Laterality: Left;  LUMBAR LAMINECTOMY/DECOMPRESSION MICRODISCECTOMY 1 LEVEL   MASTECTOMY, PARTIAL Left 02/26/2003   ; S/P re-excision of cranial and lateral margins 04/19/2003.    RE-EXCISION OF BREAST CANCER,SUPERIOR MARGINS Right 10/27/2012   Procedure: RE-EXCISION OF BREAST CANCER,SUPERIOR and inferior MARGINS;  Surgeon: [name], MD;  Location: [facility];  Service: General;  Laterality: Right;   RE-EXCISION OF BREAST LUMPECTOMY Left 04/2003  TEE WITHOUT CARDIOVERSION N/A 04/04/2019   Procedure: Transesophageal Echocardiogram ([place]);  Surgeon: Wonda Olds, MD;  Location: [facility];  Service: Open Heart Surgery;  Laterality: N/A;   THORACIC AORTIC ANEURYSM REPAIR N/A 04/04/2019   Procedure: THORACIC ASCENDING ANEURYSM REPAIR (AAA)  USING 28 MM X 30 CM HEMASHIELD PLATINUM VASCULAR GRAFT;  Surgeon: Wonda Olds, MD;  Location: [facility];  Service: Open Heart Surgery;  Laterality: N/A;    Allergies:  Allergies  Allergen Reactions   Shrimp [Shellfish Allergy] Shortness Of Breath   Bactroban [Mupirocin] Other (See Comments)    "Sores in nose"   Hydrocodone Itching, Nausea Only and Nausea And Vomiting   Chlorhexidine Gluconate Itching   Tylenol [Acetaminophen] Itching   Zestril [Lisinopril] Cough    Medications:   Prior to Admission medications   Medication Sig Start Date End Date Taking? Authorizing Provider  albuterol (VENTOLIN HFA) 108 (90 Base) MCG/ACT inhaler Inhale 2 puffs into the lungs every 6 (six) hours as needed for wheezing or shortness of breath. 07/30/20   Maximiano Coss, NP  amLODipine (NORVASC) 10 MG tablet Take 1 tablet (10 mg total) by mouth daily. 03/06/22   Setzer, Edman Circle, PA-C  apixaban  (ELIQUIS) 2.5 MG TABS tablet Take 1 tablet (2.5 mg total) by mouth 2 (two) times daily. 03/06/22   Setzer, Edman Circle, PA-C  carvedilol (COREG) 25 MG tablet Take 1 tablet (25 mg total) by mouth 2 (two) times daily with a meal. 05/01/22 05/02/23  Ghimire, Henreitta Leber, MD  Cholecalciferol (VITAMIN D-3 PO) Take 1 capsule by mouth daily.    [provider]  cloNIDine (CATAPRES - DOSED IN MG/24 HR) 0.1 mg/24hr patch Place 1 patch (0.1 mg total) onto the skin every Saturday. 03/07/22   Setzer, Edman Circle, PA-C  diphenhydrAMINE-zinc acetate (BENADRYL) cream Apply topically 3 (three) times daily as needed for itching. 03/06/22   Setzer, Edman Circle, PA-C  hydrALAZINE (APRESOLINE) 25 MG tablet Take 2 tablets (50 mg total) by mouth 2 (two) times daily. 05/01/22   Ghimire, Henreitta Leber, MD  isosorbide mononitrate (IMDUR) 30 MG 24 hr tablet Take 1 tablet (30 mg total) by mouth daily. 04/20/22   Dorna Mai, MD  lidocaine (LIDODERM) 5 % Place 1 patch onto the skin daily. Remove & Discard patch within 12 hours or as directed by MD 03/07/22   Vaughan Basta, Edman Circle, PA-C  naloxone [facility]) nasal spray 4 mg/0.1 mL 1 spray as directed. 02/19/22   [provider]  oxyCODONE-acetaminophen (PERCOCET) 10-325 MG tablet Take 1 tablet by mouth 3 (three) times daily as needed. 03/12/22   [provider]  pantoprazole (PROTONIX) 40 MG tablet Take 1 tablet (40 mg total) by mouth 2 (two) times daily. 05/01/22   Ghimire, Henreitta Leber, MD  rosuvastatin (CRESTOR) 10 MG tablet Take 1 tablet (10 mg total) by mouth daily. 04/20/22   Dorna Mai, MD  sevelamer carbonate (RENVELA) 2.4 g PACK Take 2.4 g by mouth 3 (three) times daily with meals. 03/06/22   Setzer, Edman Circle, PA-C  traZODone (DESYREL) 50 MG tablet Take 0.5 tablets (25 mg total) by mouth at bedtime as needed for sleep. 04/20/22   Dorna Mai, MD    Discontinued Meds:  There are no discontinued medications.  Social History:  reports that she quit smoking about 2  years ago. Her smoking use included cigarettes. She has a 11.00 pack-year smoking history. She has never used smokeless tobacco. She reports that she does not currently use alcohol after a  past usage of about 2.0 standard drinks of alcohol per week. She reports that she does not currently use drugs after having used the following drugs: Cocaine.  Family History:   Family History  Problem Relation Age of Onset   Colon cancer Mother 21   Hypertension Mother    Diabetes Sister 58   Hypertension Sister    Diabetes Brother    Hypertension Brother    Diabetes Brother    Hypertension Brother    Kidney disease Son        s/p renal transplant   Hypertension Son    Diabetes Son    Multiple sclerosis Son    Bone cancer Sister 84   Breast cancer Neg Hx    Cervical cancer Neg Hx     Blood pressure (!) 197/128, pulse 84, temperature 97.7 F (36.5 C), resp. rate (!) 24, SpO2 98 %. General:chronically ill appearing, thin female in NAD, +dysarthria  Heart:RRR Lungs:CTAB, nml WOB on RA Abdomen:soft, NTND Extremities:no LE edema Dialysis Access: R TDC        Adjoa Althouse, Hunt Oris, MD 07/04/2022, 6:54 PM

## 2022-07-04 NOTE — ED Provider Notes (Signed)
[facility] Provider Note   CSN: [id number] Arrival date & time: 07/04/22  1704     History  Chief Complaint  Patient presents with   missed dialysis    Monique Henderson is a 72 y.o. female.  Patient with hx esrd/hd, presents with missing her last two dialysis treatments.  EMS notes pt c/o anxiety. Pt limited/poor historian - level 5 caveat. EMS reports recent crack cocaine use. Pt denies headache. No chest pain. Mild sob. No abd pain or vomiting.   The history is provided by the patient, medical records and the EMS personnel. The history is limited by the condition of the patient.       Home Medications Prior to Admission medications   Medication Sig Start Date End Date Taking? Authorizing Provider  albuterol (VENTOLIN HFA) 108 (90 Base) MCG/ACT inhaler Inhale 2 puffs into the lungs every 6 (six) hours as needed for wheezing or shortness of breath. 07/30/20   Maximiano Coss, NP  amLODipine (NORVASC) 10 MG tablet Take 1 tablet (10 mg total) by mouth daily. 03/06/22   Setzer, Edman Circle, PA-C  apixaban (ELIQUIS) 2.5 MG TABS tablet Take 1 tablet (2.5 mg total) by mouth 2 (two) times daily. 03/06/22   Setzer, Edman Circle, PA-C  carvedilol (COREG) 25 MG tablet Take 1 tablet (25 mg total) by mouth 2 (two) times daily with a meal. 05/01/22 05/02/23  Ghimire, Henreitta Leber, MD  Cholecalciferol (VITAMIN D-3 PO) Take 1 capsule by mouth daily.    [provider]  cloNIDine (CATAPRES - DOSED IN MG/24 HR) 0.1 mg/24hr patch Place 1 patch (0.1 mg total) onto the skin every Saturday. 03/07/22   Setzer, Edman Circle, PA-C  diphenhydrAMINE-zinc acetate (BENADRYL) cream Apply topically 3 (three) times daily as needed for itching. 03/06/22   Setzer, Edman Circle, PA-C  hydrALAZINE (APRESOLINE) 25 MG tablet Take 2 tablets (50 mg total) by mouth 2 (two) times daily. 05/01/22   Ghimire, Henreitta Leber, MD  isosorbide mononitrate (IMDUR) 30 MG 24 hr tablet Take 1 tablet (30 mg  total) by mouth daily. 04/20/22   Dorna Mai, MD  lidocaine (LIDODERM) 5 % Place 1 patch onto the skin daily. Remove & Discard patch within 12 hours or as directed by MD 03/07/22   Vaughan Basta, Edman Circle, PA-C  naloxone [employer]) nasal spray 4 mg/0.1 mL 1 spray as directed. 02/19/22   [provider]  oxyCODONE-acetaminophen (PERCOCET) 10-325 MG tablet Take 1 tablet by mouth 3 (three) times daily as needed. 03/12/22   [provider]  pantoprazole (PROTONIX) 40 MG tablet Take 1 tablet (40 mg total) by mouth 2 (two) times daily. 05/01/22   Ghimire, Henreitta Leber, MD  rosuvastatin (CRESTOR) 10 MG tablet Take 1 tablet (10 mg total) by mouth daily. 04/20/22   Dorna Mai, MD  sevelamer carbonate (RENVELA) 2.4 g PACK Take 2.4 g by mouth 3 (three) times daily with meals. 03/06/22   Setzer, Edman Circle, PA-C  traZODone (DESYREL) 50 MG tablet Take 0.5 tablets (25 mg total) by mouth at bedtime as needed for sleep. 04/20/22   Dorna Mai, MD      Allergies    Shrimp [shellfish allergy], Bactroban [mupirocin], Hydrocodone, Chlorhexidine gluconate, Tylenol [acetaminophen], and Zestril [lisinopril]    Review of Systems   Review of Systems  Constitutional:  Negative for fever.  Eyes:  Negative for visual disturbance.  Respiratory:  Positive for shortness of breath.   Cardiovascular:  Negative for chest pain.  Gastrointestinal:  Negative for abdominal pain, diarrhea and vomiting.  Neurological:  Negative for speech difficulty, weakness, numbness and headaches.    Physical Exam Updated Vital Signs BP (!) 197/128 (BP Location: Left Arm)   Pulse 84   Temp 97.7 F (36.5 C)   Resp (!) 24   SpO2 98%  Physical Exam Vitals and nursing note reviewed.  Constitutional:      Appearance: Normal appearance. She is well-developed.  HENT:     Head: Atraumatic.     Nose: Nose normal.     Mouth/Throat:     Mouth: Mucous membranes are moist.  Eyes:     General: No scleral icterus.     Conjunctiva/sclera: Conjunctivae normal.     Pupils: Pupils are equal, round, and reactive to light.  Neck:     Trachea: No tracheal deviation.  Cardiovascular:     Rate and Rhythm: Normal rate and regular rhythm.     Pulses: Normal pulses.     Heart sounds: Normal heart sounds. No murmur heard.    No friction rub. No gallop.  Pulmonary:     Effort: Pulmonary effort is normal. No respiratory distress.     Breath sounds: Normal breath sounds.     Comments: HD cath right chest without sign of infection Abdominal:     General: There is no distension.     Palpations: Abdomen is soft.     Tenderness: There is no abdominal tenderness.  Genitourinary:    Comments: No cva tenderness.  Musculoskeletal:        General: No swelling or tenderness.     Cervical back: Normal range of motion and neck supple. No rigidity. No muscular tenderness.  Skin:    General: Skin is warm and dry.     Findings: No rash.  Neurological:     Mental Status: She is alert.     Comments: Alert, speech normal. Motor/sens grossly intact.   Psychiatric:     Comments: Mildly anxious appearing.      ED Results / Procedures / Treatments   Labs (all labs ordered are listed, but only abnormal results are displayed) Results for orders placed or performed during the hospital encounter of 07/04/22  CBC  Result Value Ref Range   WBC 4.8 4.0 - 10.5 K/uL   RBC 4.00 3.87 - 5.11 MIL/uL   Hemoglobin 12.0 12.0 - 15.0 g/dL   HCT 38.5 36.0 - 46.0 %   MCV 96.3 80.0 - 100.0 fL   MCH 30.0 26.0 - 34.0 pg   MCHC 31.2 30.0 - 36.0 g/dL   RDW 17.6 (H) 11.5 - 15.5 %   Platelets 237 150 - 400 K/uL   nRBC 0.0 0.0 - 0.2 %  Comprehensive metabolic panel  Result Value Ref Range   Sodium 140 135 - 145 mmol/L   Potassium 6.8 (HH) 3.5 - 5.1 mmol/L   Chloride 98 98 - 111 mmol/L   CO2 22 22 - 32 mmol/L   Glucose, Bld 108 (H) 70 - 99 mg/dL   BUN 86 (H) 8 - 23 mg/dL   Creatinine, Ser 14.52 (H) 0.44 - 1.00 mg/dL   Calcium 9.6 8.9 -  10.3 mg/dL   Total Protein 7.5 6.5 - 8.1 g/dL   Albumin 4.1 3.5 - 5.0 g/dL   AST 37 15 - 41 U/L   ALT 16 0 - 44 U/L   Alkaline Phosphatase 76 38 - 126 U/L   Total Bilirubin 0.5 0.3 - 1.2 mg/dL  GFR, Estimated 2 (L) >60 mL/min   Anion gap 20 (H) 5 - 15   *Note: Due to a large number of results and/or encounters for the requested time period, some results have not been displayed. A complete set of results can be found in Results Review.   MM 3D SCREEN BREAST BILATERAL  Result Date: 07/02/2022 CLINICAL DATA:  Screening. EXAM: DIGITAL SCREENING BILATERAL MAMMOGRAM WITH TOMOSYNTHESIS AND CAD TECHNIQUE: Bilateral screening digital craniocaudal and mediolateral oblique mammograms were obtained. Bilateral screening digital breast tomosynthesis was performed. The images were evaluated with computer-aided detection. COMPARISON:  Previous exam(s). ACR Breast Density Category d: The breast tissue is extremely dense, which lowers the sensitivity of mammography FINDINGS: There are no findings suspicious for malignancy. IMPRESSION: No mammographic evidence of malignancy. A result letter of this screening mammogram will be mailed directly to the patient. RECOMMENDATION: Screening mammogram in one year. (Code:SM-B-01Y) BI-RADS CATEGORY  1: Negative. Electronically Signed   By: Franki Cabot M.D.   On: 07/02/2022 10:50   ECHOCARDIOGRAM COMPLETE  Result Date: 06/19/2022    ECHOCARDIOGRAM REPORT   Patient Name:   LAMIRACLE CHAIDEZ Date of Exam: 06/19/2022 Medical Rec #:  [id number]          Height:       63.0 in Accession #:    [id number]         Weight:       106.0 lb Date of Birth:  07/15/1950           BSA:          1.476 m Patient Age:    5 years           BP:           130/72 mmHg Patient Gender: F                  HR:           87 bpm. Exam Location:  [address] Procedure: 2D Echo, Cardiac Doppler and Color Doppler Indications:    O97.35 Chronic systolic (congestive) heart failure  History:        Patient has  prior history of Echocardiogram examinations, most                 recent 02/25/2022. CHF, Stroke and COPD, Arrythmias:Atrial                 Fibrillation and Atrial Flutter; Risk Factors:Hypertension.                 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 Aortic                 atherosclerosis. End stage renal disease. Breast cancer. Carotid                 artery diease. Pericarditis.  Sonographer:    Diamond Nickel RCS Referring Phys: [place]  1. Distal septal hypokinesis consider bringing patient back for TDI/strain imaging Consider further w/u for amyloid. Left ventricular ejection fraction, by estimation, is 45 to 50%. The left ventricle has mildly decreased function. The left ventricle has no regional wall motion abnormalities. There is severe left ventricular hypertrophy. Left ventricular diastolic parameters are consistent with Grade II diastolic dysfunction (pseudonormalization). Elevated left ventricular end-diastolic pressure.  2. Right ventricular systolic function is normal. The right ventricular size is normal.  3. Left atrial size was mildly dilated.  4. The mitral valve is abnormal.  Mild mitral valve regurgitation. No evidence of mitral stenosis.  5. Tricuspid valve regurgitation is moderate.  6. The aortic valve is tricuspid. There is moderate thickening of the aortic valve. Aortic valve regurgitation is not visualized. No aortic stenosis is present.  7. The inferior vena cava is normal in size with greater than 50% respiratory variability, suggesting right atrial pressure of 3 mmHg. FINDINGS  Left Ventricle: Distal septal hypokinesis consider bringing patient back for TDI/strain imaging Consider further w/u for amyloid. Left ventricular ejection fraction, by estimation, is 45 to 50%. The left ventricle has mildly decreased function. The left  ventricle has no regional wall motion abnormalities. The left ventricular internal cavity size was normal in size.  There is severe left ventricular hypertrophy. Left ventricular diastolic parameters are consistent with Grade II diastolic dysfunction (pseudonormalization). Elevated left ventricular end-diastolic pressure. Right Ventricle: The right ventricular size is normal. No increase in right ventricular wall thickness. Right ventricular systolic function is normal. Left Atrium: Left atrial size was mildly dilated. Right Atrium: Right atrial size was normal in size. Pericardium: There is no evidence of pericardial effusion. Mitral Valve: The mitral valve is abnormal. There is mild thickening of the mitral valve leaflet(s). Mild mitral valve regurgitation. No evidence of mitral valve stenosis. Tricuspid Valve: The tricuspid valve is normal in structure. Tricuspid valve regurgitation is moderate . No evidence of tricuspid stenosis. Aortic Valve: The aortic valve is tricuspid. There is moderate thickening of the aortic valve. Aortic valve regurgitation is not visualized. No aortic stenosis is present. Pulmonic Valve: The pulmonic valve was normal in structure. Pulmonic valve regurgitation is not visualized. No evidence of pulmonic stenosis. Aorta: The aortic root is normal in size and structure. Venous: The inferior vena cava is normal in size with greater than 50% respiratory variability, suggesting right atrial pressure of 3 mmHg. IAS/Shunts: No atrial level shunt detected by color flow Doppler.  LEFT VENTRICLE PLAX 2D LVIDd:         3.90 cm   Diastology LVIDs:         2.80 cm   LV e' medial:    7.40 cm/s LV PW:         1.80 cm   LV E/e' medial:  15.7 LV IVS:        1.70 cm   LV e' lateral:   6.96 cm/s LVOT diam:     1.90 cm   LV E/e' lateral: 16.7 LV SV:         54 LV SV Index:   37 LVOT Area:     2.84 cm  RIGHT VENTRICLE RV Basal diam:  2.65 cm RV S prime:     4.88 cm/s TAPSE (M-mode): 1.6 cm RVSP:           36.9 mmHg LEFT ATRIUM             Index        RIGHT ATRIUM           Index LA diam:        3.80 cm 2.57 cm/m   RA  Pressure: 3.00 mmHg LA Vol (A2C):   51.4 ml 34.82 ml/m  RA Area:     16.60 cm LA Vol (A4C):   48.6 ml 32.92 ml/m  RA Volume:   39.80 ml  26.96 ml/m LA Biplane Vol: 49.8 ml 33.73 ml/m  AORTIC VALVE LVOT Vmax:   106.00 cm/s LVOT Vmean:  67.100 cm/s LVOT VTI:    0.192 m  AORTA  Ao Root diam: 3.00 cm Ao Asc diam:  3.40 cm MITRAL VALVE                TRICUSPID VALVE MV Area (PHT): 5.97 cm     TR Peak grad:   33.9 mmHg MV Decel Time: 127 msec     TR Vmax:        291.00 cm/s MV E velocity: 116.00 cm/s  Estimated RAP:  3.00 mmHg MV A velocity: 102.00 cm/s  RVSP:           36.9 mmHg MV E/A ratio:  1.14                             SHUNTS                             Systemic VTI:  0.19 m                             Systemic Diam: 1.90 cm Jenkins Rouge MD Electronically signed by Jenkins Rouge MD Signature Date/Time: 06/19/2022/11:31:38 AM    Final      EKG EKG Interpretation  Date/Time:  Saturday July 04 2022 18:34:34 EST Ventricular Rate:  86 PR Interval:  182 QRS Duration: 162 QT Interval:  458 QTC Calculation: 548 R Axis:   26 Text Interpretation: Sinus rhythm Right bundle branch block Non-specific ST-t changes Confirmed by Lajean Saver [phone]) on 07/04/2022 6:54:21 PM  Radiology No results found.  Procedures Procedures    Medications Ordered in ED Medications  calcium gluconate inj 10% (1 g) URGENT USE ONLY! (has no administration in time range)  dextrose 50 % solution 25 g (has no administration in time range)  sodium bicarbonate injection 50 mEq (has no administration in time range)  insulin aspart (novoLOG) injection 6 Units (has no administration in time range)    ED Course/ Medical Decision Making/ A&P                             Medical Decision Making Problems Addressed: Cocaine use disorder [facility]): acute illness or injury with systemic symptoms that poses a threat to life or bodily functions    Details: Acute/chronic ESRD (end stage renal disease) on dialysis [facility]): chronic  illness or injury with exacerbation, progression, or side effects of treatment that poses a threat to life or bodily functions Essential hypertension: chronic illness or injury with exacerbation, progression, or side effects of treatment that poses a threat to life or bodily functions Hyperkalemia: acute illness or injury with systemic symptoms that poses a threat to life or bodily functions  Amount and/or Complexity of Data Reviewed Independent Historian: EMS    Details: hx External Data Reviewed: notes. Labs: ordered. Decision-making details documented in ED Course. Radiology: ordered and independent interpretation performed. Decision-making details documented in ED Course. ECG/medicine tests: ordered and independent interpretation performed. Decision-making details documented in ED Course. Discussion of management or test interpretation with external provider(s): nephrology  Risk Prescription drug management. Decision regarding hospitalization.   Iv ns. Continuous pulse ox and cardiac monitoring. Labs ordered/sent. Imaging ordered.   Differential diagnosis includes  ESRD, high K, edema, etc. Dispo decision including potential need for admission considered - will get labs and imaging and reassess.   Reviewed nursing notes and prior charts for additional  history. External reports reviewed. Additional history from: EMS.   Nephrology consulted, discussed with physician on call including k 6.8. he indicates they will get to for dialysis as soon as able.   Cardiac monitor: sinus rhythm, rate 84.  Labs reviewed/interpreted by me - k 6.8..   Cal gluc iv, hc03 iv, d50 iv, insulin iv.  Nephrology will see but indicates could be awhile before able to dialyze. Lokelma dose po. Await time for dialysis.   Xrays reviewed/interpreted by me - no pna.   CRITICAL CARE RE: ESRD/HD with missed dialysis and severe hyperkalemia requiring parenteral meds and emergent dialysis.  Performed by: Mirna Mires Total critical care time: 40 minutes Critical care time was exclusive of separately billable procedures and treating other patients. Critical care was necessary to treat or prevent imminent or life-threatening deterioration. Critical care was time spent personally by me on the following activities: development of treatment plan with patient and/or surrogate as well as nursing, discussions with consultants, evaluation of patient's response to treatment, examination of patient, obtaining history from patient or surrogate, ordering and performing treatments and interventions, ordering and review of laboratory studies, ordering and review of radiographic studies, pulse oximetry and re-evaluation of patient's condition.          Final Clinical Impression(s) / ED Diagnoses Final diagnoses:  ESRD (end stage renal disease) on dialysis ([place])  Hyperkalemia  Cocaine use disorder [facility])  Essential hypertension    Rx / DC Orders ED Discharge Orders     None         Lajean Saver, MD 07/04/22 1914

## 2022-07-04 NOTE — ED Triage Notes (Signed)
Pt from home via [facility] with reports of anxiety due to missing her last 2 dialysis treatment. Pt reports she missed yesterdays appointment to smoke crack.

## 2022-07-04 NOTE — Progress Notes (Signed)
IV obtained by ED RN.

## 2022-07-04 NOTE — ED Notes (Signed)
Received shift report, assumed care of patient at this time  

## 2022-07-04 NOTE — ED Notes (Signed)
Ashok Cordia, MD aware that the patient does not have IV access, difficult stick, asked if he could do Ultra sound guided, he advised IV team.  IV team order placed.

## 2022-07-04 NOTE — ED Notes (Signed)
When this RN assumed care of patient, patient had no IV access, IV team consult was in and patient waiting for IV team to obtain access, MD was aware.

## 2022-07-04 NOTE — ED Notes (Signed)
Report given to dialysis RN, this RN transported patient to dialysis on cardiac monitor.

## 2022-07-05 MED ORDER — DILTIAZEM HCL 25 MG/5ML IV SOLN
20.0000 mg | Freq: Once | INTRAVENOUS | Status: AC
  Administered 2022-07-05: 20 mg via INTRAVENOUS
  Filled 2022-07-05: qty 5

## 2022-07-05 NOTE — ED Notes (Signed)
Pollina, MD made aware of pts HR.

## 2022-07-05 NOTE — ED Provider Notes (Signed)
Patient return to the emergency department after dialysis.  Blood pressure was still elevated and she was noted to be tachycardic.  An EKG revealed atrial fibrillation.  Patient does have a history of paroxysmal atrial fibrillation and is anticoagulated on Eliquis.  Patient given an IV bolus of Cardizem and her rate slowed and then she converted back to sinus rhythm.  She has done well.  She does not require any further intervention, appropriate for discharge.   [name], MD 07/05/22 [phone]

## 2022-07-05 NOTE — ED Notes (Addendum)
Larene Beach, RN spoke to [employer] (sister), verbalized that pts niece is on the way to pick up the pt. Pt able to ambulate to the wheel chair by self.

## 2022-07-08 ENCOUNTER — Telehealth: Payer: Self-pay | Admitting: Physical Medicine and Rehabilitation

## 2022-07-08 ENCOUNTER — Ambulatory Visit: Payer: Medicare Other | Admitting: Physical Medicine and Rehabilitation

## 2022-07-08 DIAGNOSIS — M25511 Pain in right shoulder: Secondary | ICD-10-CM

## 2022-07-08 DIAGNOSIS — R600 Localized edema: Secondary | ICD-10-CM

## 2022-07-08 DIAGNOSIS — G8191 Hemiplegia, unspecified affecting right dominant side: Secondary | ICD-10-CM

## 2022-07-08 NOTE — Telephone Encounter (Signed)
Patient relative upset after bringing patient to office because she did not have an appointment that was previously moved up.

## 2022-07-09 NOTE — Telephone Encounter (Signed)
Discussed concerns with patient's sister, Enid Derry, who is her medical transport person and assists in communicating during [place]'s appointments due to moderate dysarthria.   Updates are as follows:  - [employer] does not provide custom edema garments. They were able to buy a full edema sleeve from a medical supply store, which has significantly reduced her swelling and discomfort.   - No longer feel a shoulder injection would be beneficial, because pain control had improved with edema glove.  - [employer] program does not accept stroke patients. Sister has noticed improved mobility and functional use over the past few weeks as swelling came down, and is wondering if she can go back to hand therapy.   I left VM for Kiley asking for a call back, and informing of referral back to hand therapy being placed. Asked her to either reschedule appointment or also telehealth visit is fine if transportation is an issue.    Gertie Gowda, DO 07/09/2022

## 2022-07-10 ENCOUNTER — Ambulatory Visit: Payer: 59 | Admitting: Interventional Cardiology

## 2022-07-20 ENCOUNTER — Telehealth: Payer: Self-pay | Admitting: *Deleted

## 2022-07-20 NOTE — Telephone Encounter (Signed)
Patient called to complete AWV. NO answer but left VM

## 2022-07-22 ENCOUNTER — Encounter: Payer: Self-pay | Admitting: Family Medicine

## 2022-07-22 ENCOUNTER — Ambulatory Visit (INDEPENDENT_AMBULATORY_CARE_PROVIDER_SITE_OTHER): Payer: 59 | Admitting: Family Medicine

## 2022-07-22 VITALS — BP 131/76 | HR 79 | Temp 98.0°F | Resp 16 | Ht 63.0 in | Wt 108.4 lb

## 2022-07-22 DIAGNOSIS — F32A Depression, unspecified: Secondary | ICD-10-CM

## 2022-07-22 DIAGNOSIS — Z0000 Encounter for general adult medical examination without abnormal findings: Secondary | ICD-10-CM

## 2022-07-22 DIAGNOSIS — F419 Anxiety disorder, unspecified: Secondary | ICD-10-CM | POA: Diagnosis not present

## 2022-07-22 DIAGNOSIS — G4700 Insomnia, unspecified: Secondary | ICD-10-CM | POA: Diagnosis not present

## 2022-07-22 DIAGNOSIS — Z0001 Encounter for general adult medical examination with abnormal findings: Secondary | ICD-10-CM | POA: Diagnosis not present

## 2022-07-22 DIAGNOSIS — K219 Gastro-esophageal reflux disease without esophagitis: Secondary | ICD-10-CM

## 2022-07-22 NOTE — Progress Notes (Unsigned)
Subjective:   Monique Henderson is a 72 y.o. female who presents for Medicare Annual (Subsequent) preventive examination.  Review of Systems    Refer to Pcp       Objective:    Today's Vitals   07/22/22 0939  BP: (!) 152/81  Pulse: 79  Resp: 16  Temp: 98 F (36.7 C)  TempSrc: Oral  SpO2: 96%  Weight: 108 lb 6.4 oz (49.2 kg)  Height: 5' 3"$  (1.6 m)   Body mass index is 19.2 kg/m.     07/22/2022    9:32 AM 07/04/2022    5:13 PM 04/29/2022    6:45 PM 04/13/2022   10:18 AM 04/01/2022    3:19 PM 02/26/2022    7:00 PM 02/23/2022    1:50 PM  Advanced Directives  Does Patient Have a Medical Advance Directive? No No No No No No No  Would patient like information on creating a medical advance directive?   No - Patient declined No - Patient declined No - Patient declined No - Patient declined No - Patient declined    Current Medications (verified) Outpatient Encounter Medications as of 07/22/2022  Medication Sig   albuterol (VENTOLIN HFA) 108 (90 Base) MCG/ACT inhaler Inhale 2 puffs into the lungs every 6 (six) hours as needed for wheezing or shortness of breath.   amLODipine (NORVASC) 10 MG tablet Take 1 tablet (10 mg total) by mouth daily.   apixaban (ELIQUIS) 2.5 MG TABS tablet Take 1 tablet (2.5 mg total) by mouth 2 (two) times daily.   carvedilol (COREG) 25 MG tablet Take 1 tablet (25 mg total) by mouth 2 (two) times daily with a meal.   Cholecalciferol (VITAMIN D-3 PO) Take 1 capsule by mouth daily.   cloNIDine (CATAPRES - DOSED IN MG/24 HR) 0.1 mg/24hr patch Place 1 patch (0.1 mg total) onto the skin every Saturday.   diphenhydrAMINE-zinc acetate (BENADRYL) cream Apply topically 3 (three) times daily as needed for itching.   hydrALAZINE (APRESOLINE) 25 MG tablet Take 2 tablets (50 mg total) by mouth 2 (two) times daily.   isosorbide mononitrate (IMDUR) 30 MG 24 hr tablet Take 1 tablet (30 mg total) by mouth daily.   lidocaine (LIDODERM) 5 % Place 1 patch onto the skin  daily. Remove & Discard patch within 12 hours or as directed by MD   naloxone (NARCAN) nasal spray 4 mg/0.1 mL 1 spray as directed.   oxyCODONE-acetaminophen (PERCOCET) 10-325 MG tablet Take 1 tablet by mouth 3 (three) times daily as needed.   pantoprazole (PROTONIX) 40 MG tablet Take 1 tablet (40 mg total) by mouth 2 (two) times daily.   rosuvastatin (CRESTOR) 10 MG tablet Take 1 tablet (10 mg total) by mouth daily.   sevelamer carbonate (RENVELA) 2.4 g PACK Take 2.4 g by mouth 3 (three) times daily with meals.   traZODone (DESYREL) 50 MG tablet Take 0.5 tablets (25 mg total) by mouth at bedtime as needed for sleep.   No facility-administered encounter medications on file as of 07/22/2022.    Allergies (verified) Shrimp [shellfish allergy], Bactroban [mupirocin], Hydrocodone, Chlorhexidine gluconate, Tylenol [acetaminophen], and Zestril [lisinopril]   History: Past Medical History:  Diagnosis Date   AF (paroxysmal atrial fibrillation) ([place]) 05/29/2019   on Coumadin   Aortic atherosclerosis ([place]) 07/05/2019   Aortic dissection ([place]) 04/04/2019   s/p repair   Bone spur 2008   Right calcaneal foot spur   Breast cancer ([place]) 2004   Ductal carcinoma in situ of the left  breast; S/P left partial mastectomy 02/26/2003; S/P re-excision of cranial and lateral margins11/18/2004.radiation   Carotid artery disease ([facility])    Cerebral thrombosis with cerebral infarction 05/22/2019   Chronic HFrEF (heart failure with reduced ejection fraction) (HCC)    Chronic low back pain 06/22/2016   Chronic obstructive lung disease ([place]) 01/16/2017   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 Autumn Messing; S/P re-excision of superior and inferior margins 10/27/2012.    ESRD on hemodialysis ([place]) 05/29/2019   Essential hypertension 09/16/2006   GERD 09/16/2006   Hepatitis C    treated and RNA confirmed not detectable 01/2017   Insomnia 03/14/2015    Malnutrition of moderate degree 05/19/2019   Non compliance with medical treatment 12/04/2017   Normocytic anemia    With thrombocytosis   Osteoarthritis    Right ureteral stone 2002   S/P lumbar spinal fusion 01/18/2014   S/P lumbar decompressive laminectomy, fusion, and plating for lumbar spinal stenosis on 05/27/2009 by Dr. Eustace Moore.  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 Eustace Moore.     Stroke (cerebrum) ([place])    Tobacco use disorder 04/19/2009   Uterine fibroid    Wears dentures    top   Past Surgical History:  Procedure Laterality Date   ANTERIOR LAT LUMBAR FUSION N/A 01/18/2014   Procedure: ANTERIOR LATERAL LUMBAR FUSION LUMBAR TWO-THREE;  Surgeon: Eustace Moore, MD;  Location: [facility];  Service: Neurosurgery;  Laterality: N/A;   ANTERIOR LUMBAR FUSION  01/18/2014   AV FISTULA PLACEMENT Left 04/20/2019   Procedure: ARTERIOVENOUS (AV) FISTULA CREATION;  Surgeon: [name], MD;  Location: [facility];  Service: Vascular;  Laterality: Left;   BACK SURGERY     BREAST LUMPECTOMY Left 01/2003   BREAST LUMPECTOMY Right 2014   BREAST LUMPECTOMY WITH NEEDLE LOCALIZATION AND AXILLARY SENTINEL LYMPH NODE BX Right 10/13/2012   Procedure: BREAST LUMPECTOMY WITH NEEDLE LOCALIZATION;  Surgeon: [name], MD;  Location: [facility];  Service: General;  Laterality: Right;  Right breast wire localized lumpectomy   INSERTION OF DIALYSIS CATHETER Right 04/20/2019   Procedure: INSERTION OF DIALYSIS CATHETER, right internal jugular;  Surgeon: [name], MD;  Location: [facility];  Service: Vascular;  Laterality: Right;   INSERTION OF DIALYSIS CATHETER Right 09/24/2020   Procedure: INSERTION OF TUNNELED DIALYSIS CATHETER;  Surgeon: Rosetta Posner, MD;   Location: [facility];  Service: Vascular;  Laterality: Right;   IR FLUORO GUIDE CV LINE RIGHT  09/22/2020   IR THORACENTESIS ASP PLEURAL SPACE W/IMG GUIDE  05/19/2019   IR US GUIDE VASC ACCESS LEFT  09/22/2020   IR US GUIDE VASC ACCESS RIGHT  09/22/2020   IR VENOCAVAGRAM SVC  09/22/2020   LAMINECTOMY  05/27/2009   Lumbar decompressive laminectomy, fusion and plating for lumbar spinal stensosis   LIGATION OF ARTERIOVENOUS  FISTULA Left 09/24/2020   Procedure: LIGATION OF LEFT ARM ARTERIOVENOUS  FISTULA;  Surgeon: Rosetta Posner, MD;  Location: [place] [facility];  Service: Vascular;  Laterality: Left;   LUMBAR LAMINECTOMY/DECOMPRESSION MICRODISCECTOMY Left 03/23/2013   Procedure: LUMBAR LAMINECTOMY/DECOMPRESSION MICRODISCECTOMY 1 LEVEL;  Surgeon: Eustace Moore, MD;  Location: [facility];  Service: Neurosurgery;  Laterality: Left;  LUMBAR LAMINECTOMY/DECOMPRESSION MICRODISCECTOMY 1 LEVEL   MASTECTOMY,  PARTIAL Left 02/26/2003   ; S/P re-excision of cranial and lateral margins 04/19/2003.    RE-EXCISION OF BREAST CANCER,SUPERIOR MARGINS Right 10/27/2012   Procedure: RE-EXCISION OF BREAST CANCER,SUPERIOR and inferior MARGINS;  Surgeon: [name], MD;  Location: [place];  Service: General;  Laterality: Right;   RE-EXCISION OF BREAST LUMPECTOMY Left 04/2003   TEE WITHOUT CARDIOVERSION N/A 04/04/2019   Procedure: Transesophageal Echocardiogram (Tee);  Surgeon: Wonda Olds, MD;  Location: [facility];  Service: Open Heart Surgery;  Laterality: N/A;   THORACIC AORTIC ANEURYSM REPAIR N/A 04/04/2019   Procedure: THORACIC ASCENDING ANEURYSM REPAIR (AAA)  USING 28 MM X 30 CM HEMASHIELD PLATINUM VASCULAR GRAFT;  Surgeon: Wonda Olds, MD;  Location: [facility];  Service: Open Heart Surgery;  Laterality: N/A;   Family History  Problem Relation Age of Onset   Colon cancer Mother 1   Hypertension Mother    Diabetes Sister 35   Hypertension Sister    Diabetes Brother    Hypertension Brother    Diabetes Brother     Hypertension Brother    Kidney disease Son        s/p renal transplant   Hypertension Son    Diabetes Son    Multiple sclerosis Son    Bone cancer Sister 73   Breast cancer Neg Hx    Cervical cancer Neg Hx    Social History   Socioeconomic History   Marital status: Single    Spouse name: Not on file   Number of children: 2   Years of education: Not on file   Highest education level: Not on file  Occupational History   Occupation: disabled  Tobacco Use   Smoking status: Former    Packs/day: 0.25    Years: 44.00    Total pack years: 11.00    Types: Cigarettes    Quit date: 2022    Years since quitting: 2.1   Smokeless tobacco: Never   Tobacco comments:    smoking less  Vaping Use   Vaping Use: Never used  Substance and Sexual Activity   Alcohol use: Not Currently    Alcohol/week: 2.0 standard drinks of alcohol    Types: 2 Cans of beer per week    Comment: "couple beers a weekend", not currently   Drug use: Not Currently    Types: Cocaine    Comment: last in 2005   Sexual activity: Yes    Birth control/protection: Post-menopausal  Other Topics Concern   Not on file  Social History Narrative   Not on file   Social Determinants of Health   Financial Resource Strain: Not on file  Food Insecurity: No Food Insecurity (04/30/2022)   Hunger Vital Sign    Worried About Running Out of Food in the Last Year: Never true    Ran Out of Food in the Last Year: Never true  Transportation Needs: No Transportation Needs (04/30/2022)   PRAPARE - Hydrologist (Medical): No    Lack of Transportation (Non-Medical): No  Physical Activity: Not on file  Stress: Not on file  Social Connections: Not on file    Tobacco Counseling Counseling given: Not Answered Tobacco comments: smoking less   Clinical Intake:  Pre-visit preparation completed: No  Pain : No/denies pain     Diabetes: No     Diabetic?n/a         Activities of Daily  Living    07/22/2022    9:33 AM  04/29/2022    6:45 PM  In your present state of health, do you have any difficulty performing the following activities:  Hearing? 0 0  Vision? 1 0  Difficulty concentrating or making decisions? 0 0  Walking or climbing stairs? 0 0  Dressing or bathing? 0 0  Doing errands, shopping? 0 1  Preparing Food and eating ? N   Using the Toilet? N   In the past six months, have you accidently leaked urine? N   Do you have problems with loss of bowel control? N   Managing your Medications? N   Managing your Finances? N   Housekeeping or managing your Housekeeping? N     Patient Care Team: Dorna Mai, MD as PCP - General (Family Medicine) Jeanella Anton, NP as Nurse Practitioner (Pain Medicine) Melene Plan, RMA  Indicate any recent Medical Services you may have received from other than [facility] providers in the past year (date may be approximate).     Assessment:   This is a routine wellness examination for [place].  Hearing/Vision screen No results found.  Dietary issues and exercise activities discussed:     Goals Addressed   None   Depression Screen    06/17/2022   11:04 AM 05/22/2022   10:25 AM 04/20/2022    1:20 PM 04/06/2022   10:32 AM 02/14/2021    8:02 AM 05/13/2020   10:01 AM 11/30/2019    3:28 PM  PHQ 2/9 Scores  PHQ - 2 Score 1 2 3 1 5 $ 0 0  PHQ- 9 Score  7 10 4 22      $ Fall Risk    07/22/2022    9:32 AM 06/17/2022   11:04 AM 04/06/2022   10:32 AM 04/01/2022   10:13 AM 02/14/2021    8:02 AM  Fall Risk   Falls in the past year? 0 0 0 0 1  Comment  Last fall today at home.     Number falls in past yr: 0 1  0 1  Injury with Fall? 0 0  0 0  Risk for fall due to : No Fall Risks        FALL RISK PREVENTION PERTAINING TO THE HOME:  Any stairs in or around the home? Yes  If so, are there any without handrails? Yes  Home free of loose throw rugs in walkways, pet beds, electrical cords, etc? Yes  Adequate lighting in your  home to reduce risk of falls? Yes   ASSISTIVE DEVICES UTILIZED TO PREVENT FALLS:  Life alert? No  Use of a cane, walker or w/c? No  Grab bars in the bathroom? Yes  Shower chair or bench in shower? No  Elevated toilet seat or a handicapped toilet? No   TIMED UP AND GO:  Was the test performed? No .  Length of time to ambulate 10 feet:  sec.   Gait slow and steady without use of assistive device  Cognitive Function:    07/22/2022    9:34 AM  MMSE - Mini Mental State Exam  Orientation to time 5  Orientation to Place 5  Registration 3  Attention/ Calculation 5  Recall 3  Language- name 2 objects 2  Language- repeat 1  Language- read & follow direction 1  Write a sentence 1        07/22/2022    9:36 AM 05/04/2018    8:43 AM  6CIT Screen  What Year? 4 points 0 points  What month? 3 points  0 points  What time? 3 points 0 points  Count back from 20 2 points 2 points  Months in reverse 2 points 2 points  Repeat phrase 6 points 0 points  Total Score 20 points 4 points    Immunizations Immunization History  Administered Date(s) Administered   Hepatitis A, Adult 01/18/2017, 05/04/2018   Pneumococcal Conjugate-13 12/22/2017    TDAP status: Due, Education has been provided regarding the importance of this vaccine. Advised may receive this vaccine at local pharmacy or Health Dept. Aware to provide a copy of the vaccination record if obtained from local pharmacy or Health Dept. Verbalized acceptance and understanding.  Flu Vaccine status: Declined, Education has been provided regarding the importance of this vaccine but patient still declined. Advised may receive this vaccine at local pharmacy or Health Dept. Aware to provide a copy of the vaccination record if obtained from local pharmacy or Health Dept. Verbalized acceptance and understanding.  Pneumococcal vaccine status: Declined,  Education has been provided regarding the importance of this vaccine but patient still  declined. Advised may receive this vaccine at local pharmacy or Health Dept. Aware to provide a copy of the vaccination record if obtained from local pharmacy or Health Dept. Verbalized acceptance and understanding.   Covid-19 vaccine status: Declined, Education has been provided regarding the importance of this vaccine but patient still declined. Advised may receive this vaccine at local pharmacy or Health Dept.or vaccine clinic. Aware to provide a copy of the vaccination record if obtained from local pharmacy or Health Dept. Verbalized acceptance and understanding.  Qualifies for Shingles Vaccine? Yes   Zostavax completed No   Shingrix Completed?: No.    Education has been provided regarding the importance of this vaccine. Patient has been advised to call insurance company to determine out of pocket expense if they have not yet received this vaccine. Advised may also receive vaccine at local pharmacy or Health Dept. Verbalized acceptance and understanding.  Screening Tests Health Maintenance  Topic Date Due   Pneumonia Vaccine 18+ Years old (2 of 2 - PPSV23 or PCV20) 04/21/2023 (Originally 02/16/2018)   Fecal DNA (Cologuard)  05/23/2023 (Originally 06/15/2021)   Medicare Annual Wellness (AWV)  07/23/2023   MAMMOGRAM  07/01/2024   DEXA SCAN  Completed   Hepatitis C Screening  Completed   HPV VACCINES  Aged Out   DTaP/Tdap/Td  Discontinued   INFLUENZA VACCINE  Discontinued   COVID-19 Vaccine  Discontinued   Zoster Vaccines- Shingrix  Discontinued    Health Maintenance  There are no preventive care reminders to display for this patient.   Colorectal cancer screening: Type of screening: FOBT/FIT. Completed  . Repeat every 5 years  Mammogram status: Completed 2024. Repeat every year  Bone Density status: Ordered  . Pt provided with contact info and advised to call to schedule appt.  Lung Cancer Screening: (Low Dose CT Chest recommended if Age 62-80 years, 30 pack-year currently smoking  OR have quit w/in 15years.) does qualify.   Lung Cancer Screening Referral: n/a  Additional Screening:  Hepatitis C Screening: does not qualify; Completed 2023  Vision Screening: Recommended annual ophthalmology exams for early detection of glaucoma and other disorders of the eye. Is the patient up to date with their annual eye exam?  Yes  Who is the provider or what is the name of the office in which the patient attends annual eye exams? N/a  If pt is not established with a provider, would they like to be referred to a  provider to establish care? No .   Dental Screening: Recommended annual dental exams for proper oral hygiene  Community Resource Referral / Chronic Care Management: CRR required this visit?  No   CCM required this visit?  No      Plan:     I have personally reviewed and noted the following in the patient's chart:   Medical and social history Use of alcohol, tobacco or illicit drugs  Current medications and supplements including opioid prescriptions. Patient is not currently taking opioid prescriptions. Functional ability and status Nutritional status Physical activity Advanced directives List of other physicians Hospitalizations, surgeries, and ER visits in previous 12 months Vitals Screenings to include cognitive, depression, and falls Referrals and appointments  In addition, I have reviewed and discussed with patient certain preventive protocols, quality metrics, and best practice recommendations. A written personalized care plan for preventive services as well as general preventive health recommendations were provided to patient.     Melene Plan, RMA   07/22/2022   Nurse Notes:

## 2022-07-23 NOTE — Progress Notes (Signed)
Established Patient Office Visit  Subjective    Patient ID: Monique Henderson, female    DOB: 02-13-1951  Age: 72 y.o. MRN: [id number]  CC:  Chief Complaint  Patient presents with   Medicare Wellness    HPI Monique Henderson presents for routine follow up of chronic med issues. Patient denies acute complaints or concerns.    Outpatient Encounter Medications as of 07/22/2022  Medication Sig   albuterol (VENTOLIN HFA) 108 (90 Base) MCG/ACT inhaler Inhale 2 puffs into the lungs every 6 (six) hours as needed for wheezing or shortness of breath.   amLODipine (NORVASC) 10 MG tablet Take 1 tablet (10 mg total) by mouth daily.   apixaban (ELIQUIS) 2.5 MG TABS tablet Take 1 tablet (2.5 mg total) by mouth 2 (two) times daily.   carvedilol (COREG) 25 MG tablet Take 1 tablet (25 mg total) by mouth 2 (two) times daily with a meal.   Cholecalciferol (VITAMIN D-3 PO) Take 1 capsule by mouth daily.   cloNIDine (CATAPRES - DOSED IN MG/24 HR) 0.1 mg/24hr patch Place 1 patch (0.1 mg total) onto the skin every Saturday.   diphenhydrAMINE-zinc acetate (BENADRYL) cream Apply topically 3 (three) times daily as needed for itching.   hydrALAZINE (APRESOLINE) 25 MG tablet Take 2 tablets (50 mg total) by mouth 2 (two) times daily.   isosorbide mononitrate (IMDUR) 30 MG 24 hr tablet Take 1 tablet (30 mg total) by mouth daily.   lidocaine (LIDODERM) 5 % Place 1 patch onto the skin daily. Remove & Discard patch within 12 hours or as directed by MD   naloxone (NARCAN) nasal spray 4 mg/0.1 mL 1 spray as directed.   oxyCODONE-acetaminophen (PERCOCET) 10-325 MG tablet Take 1 tablet by mouth 3 (three) times daily as needed.   pantoprazole (PROTONIX) 40 MG tablet Take 1 tablet (40 mg total) by mouth 2 (two) times daily.   rosuvastatin (CRESTOR) 10 MG tablet Take 1 tablet (10 mg total) by mouth daily.   sevelamer carbonate (RENVELA) 2.4 g PACK Take 2.4 g by mouth 3 (three) times daily with meals.   traZODone (DESYREL)  50 MG tablet Take 0.5 tablets (25 mg total) by mouth at bedtime as needed for sleep.   No facility-administered encounter medications on file as of 07/22/2022.    Past Medical History:  Diagnosis Date   AF (paroxysmal atrial fibrillation) ([place]) 05/29/2019   on Coumadin   Aortic atherosclerosis ([place]) 07/05/2019   Aortic dissection ([place]) 04/04/2019   s/p repair   Bone spur 2008   Right calcaneal foot spur   Breast cancer ([place]) 2004   Ductal carcinoma in situ of the left breast; S/P left partial mastectomy 02/26/2003; S/P re-excision of cranial and lateral margins11/18/2004.radiation   Carotid artery disease ([facility])    Cerebral thrombosis with cerebral infarction 05/22/2019   Chronic HFrEF (heart failure with reduced ejection fraction) ([facility])    Chronic low back pain 06/22/2016   Chronic obstructive lung disease ([place]) 01/16/2017   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 Autumn Messing; S/P re-excision of superior and inferior margins 10/27/2012.    ESRD on hemodialysis ([place]) 05/29/2019   Essential hypertension 09/16/2006   GERD 09/16/2006   Hepatitis C    treated and RNA confirmed not detectable 01/2017   Insomnia 03/14/2015   Malnutrition of moderate degree 05/19/2019   Non compliance with medical treatment 12/04/2017   Normocytic anemia    With thrombocytosis  Osteoarthritis    Right ureteral stone 2002   S/P lumbar spinal fusion 01/18/2014   S/P lumbar decompressive laminectomy, fusion, and plating for lumbar spinal stenosis on 05/27/2009 by Dr. Eustace Moore.  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 Eustace Moore.     Stroke (cerebrum) ([place])    Tobacco use disorder 04/19/2009   Uterine fibroid    Wears dentures    top    Past  Surgical History:  Procedure Laterality Date   ANTERIOR LAT LUMBAR FUSION N/A 01/18/2014   Procedure: ANTERIOR LATERAL LUMBAR FUSION LUMBAR TWO-THREE;  Surgeon: Eustace Moore, MD;  Location: [facility];  Service: Neurosurgery;  Laterality: N/A;   ANTERIOR LUMBAR FUSION  01/18/2014   AV FISTULA PLACEMENT Left 04/20/2019   Procedure: ARTERIOVENOUS (AV) FISTULA CREATION;  Surgeon: [name], MD;  Location: [facility];  Service: Vascular;  Laterality: Left;   BACK SURGERY     BREAST LUMPECTOMY Left 01/2003   BREAST LUMPECTOMY Right 2014   BREAST LUMPECTOMY WITH NEEDLE LOCALIZATION AND AXILLARY SENTINEL LYMPH NODE BX Right 10/13/2012   Procedure: BREAST LUMPECTOMY WITH NEEDLE LOCALIZATION;  Surgeon: [name], MD;  Location: [facility];  Service: General;  Laterality: Right;  Right breast wire localized lumpectomy   INSERTION OF DIALYSIS CATHETER Right 04/20/2019   Procedure: INSERTION OF DIALYSIS CATHETER, right internal jugular;  Surgeon: [name], MD;  Location: [facility];  Service: Vascular;  Laterality: Right;   INSERTION OF DIALYSIS CATHETER Right 09/24/2020   Procedure: INSERTION OF TUNNELED DIALYSIS CATHETER;  Surgeon: Rosetta Posner, MD;  Location: [facility];  Service: Vascular;  Laterality: Right;   IR FLUORO GUIDE CV LINE RIGHT  09/22/2020   IR THORACENTESIS ASP PLEURAL SPACE W/IMG GUIDE  05/19/2019   IR US GUIDE VASC ACCESS LEFT  09/22/2020   IR US GUIDE VASC ACCESS RIGHT  09/22/2020   IR VENOCAVAGRAM SVC  09/22/2020   LAMINECTOMY  05/27/2009   Lumbar decompressive laminectomy, fusion and plating for lumbar spinal stensosis   LIGATION OF ARTERIOVENOUS  FISTULA Left 09/24/2020   Procedure: LIGATION OF LEFT ARM ARTERIOVENOUS  FISTULA;  Surgeon: Rosetta Posner, MD;  Location: [facility];  Service: Vascular;  Laterality: Left;   LUMBAR LAMINECTOMY/DECOMPRESSION MICRODISCECTOMY Left 03/23/2013   Procedure: LUMBAR LAMINECTOMY/DECOMPRESSION MICRODISCECTOMY 1 LEVEL;   Surgeon: Eustace Moore, MD;  Location: [facility];  Service: Neurosurgery;  Laterality: Left;  LUMBAR LAMINECTOMY/DECOMPRESSION MICRODISCECTOMY 1 LEVEL   MASTECTOMY, PARTIAL Left 02/26/2003   ; S/P re-excision of cranial and lateral margins 04/19/2003.    RE-EXCISION OF BREAST CANCER,SUPERIOR MARGINS Right 10/27/2012   Procedure: RE-EXCISION OF BREAST CANCER,SUPERIOR and inferior MARGINS;  Surgeon: [name], MD;  Location: [facility];  Service: General;  Laterality: Right;   RE-EXCISION OF BREAST LUMPECTOMY Left 04/2003   TEE WITHOUT CARDIOVERSION N/A 04/04/2019   Procedure: Transesophageal Echocardiogram ([facility]);  Surgeon: Wonda Olds, MD;  Location: [facility];  Service: Open Heart Surgery;  Laterality: N/A;   THORACIC AORTIC ANEURYSM REPAIR N/A 04/04/2019   Procedure: THORACIC ASCENDING ANEURYSM REPAIR (AAA)  USING 28 MM X 30 CM HEMASHIELD PLATINUM VASCULAR GRAFT;  Surgeon: Wonda Olds, MD;  Location: [facility];  Service: Open Heart Surgery;  Laterality: N/A;    Family History  Problem Relation Age  of Onset   Colon cancer Mother 63   Hypertension Mother    Diabetes Sister 69   Hypertension Sister    Diabetes Brother    Hypertension Brother    Diabetes Brother    Hypertension Brother    Kidney disease Son        s/p renal transplant   Hypertension Son    Diabetes Son    Multiple sclerosis Son    Bone cancer Sister 71   Breast cancer Neg Hx    Cervical cancer Neg Hx     Social History   Socioeconomic History   Marital status: Single    Spouse name: Not on file   Number of children: 2   Years of education: Not on file   Highest education level: Not on file  Occupational History   Occupation: disabled  Tobacco Use   Smoking status: Former    Packs/day: 0.25    Years: 44.00    Total pack years: 11.00    Types: Cigarettes    Quit date: 2022    Years since quitting: 2.1   Smokeless tobacco: Never   Tobacco comments:    smoking less  Vaping Use   Vaping Use: Never  used  Substance and Sexual Activity   Alcohol use: Not Currently    Alcohol/week: 2.0 standard drinks of alcohol    Types: 2 Cans of beer per week    Comment: "couple beers a weekend", not currently   Drug use: Not Currently    Types: Cocaine    Comment: last in 2005   Sexual activity: Yes    Birth control/protection: Post-menopausal  Other Topics Concern   Not on file  Social History Narrative   Not on file   Social Determinants of Health   Financial Resource Strain: Not on file  Food Insecurity: No Food Insecurity (04/30/2022)   Hunger Vital Sign    Worried About Running Out of Food in the Last Year: Never true    Ran Out of Food in the Last Year: Never true  Transportation Needs: No Transportation Needs (04/30/2022)   PRAPARE - Hydrologist (Medical): No    Lack of Transportation (Non-Medical): No  Physical Activity: Not on file  Stress: Not on file  Social Connections: Not on file  Intimate Partner Violence: Not At Risk (04/30/2022)   Humiliation, Afraid, Rape, and Kick questionnaire    Fear of Current or Ex-Partner: No    Emotionally Abused: No    Physically Abused: No    Sexually Abused: No    Review of Systems  All other systems reviewed and are negative.       Objective    BP 131/76   Pulse 79   Temp 98 F (36.7 C) (Oral)   Resp 16   Ht 5' 3"$  (1.6 m)   Wt 108 lb 6.4 oz (49.2 kg)   SpO2 96%   BMI 19.20 kg/m   Physical Exam Vitals and nursing note reviewed.  Constitutional:      General: She is not in acute distress. Cardiovascular:     Rate and Rhythm: Normal rate and regular rhythm.  Pulmonary:     Effort: Pulmonary effort is normal.     Breath sounds: Normal breath sounds.  Abdominal:     Palpations: Abdomen is soft.     Tenderness: There is no abdominal tenderness.  Neurological:     General: No focal deficit present.     Mental  Status: She is alert and oriented to person, place, and time.          Assessment & Plan:   1. Encounter for Medicare annual wellness exam   2. Anxiety and depression Appears stable. Continue   3. Insomnia, unspecified type Continue   4. Gastroesophageal reflux disease, unspecified whether esophagitis present Continue     Return in 3 months (on 10/20/2022) for follow up.   Becky Sax, MD

## 2022-07-23 NOTE — Therapy (Incomplete)
OUTPATIENT OCCUPATIONAL THERAPY NEURO EVALUATION  Patient Name: Monique Henderson MRN: [id number] DOB:[birth year], 72 y.o., female Today's Date: 07/23/2022  PCP: Dorna Mai, MD REFERRING PROVIDER: Gertie Gowda, DO   END OF SESSION:   Past Medical History:  Diagnosis Date   AF (paroxysmal atrial fibrillation) ([place]) 05/29/2019   on Coumadin   Aortic atherosclerosis ([place]) 07/05/2019   Aortic dissection ([place]) 04/04/2019   s/p repair   Bone spur 2008   Right calcaneal foot spur   Breast cancer ([place]) 2004   Ductal carcinoma in situ of the left breast; S/P left partial mastectomy 02/26/2003; S/P re-excision of cranial and lateral margins11/18/2004.radiation   Carotid artery disease (HCC)    Cerebral thrombosis with cerebral infarction 05/22/2019   Chronic HFrEF (heart failure with reduced ejection fraction) (HCC)    Chronic low back pain 06/22/2016   Chronic obstructive lung disease ([place]) 01/16/2017   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 Autumn Messing; S/P re-excision of superior and inferior margins 10/27/2012.    ESRD on hemodialysis ([place]) 05/29/2019   Essential hypertension 09/16/2006   GERD 09/16/2006   Hepatitis C    treated and RNA confirmed not detectable 01/2017   Insomnia 03/14/2015   Malnutrition of moderate degree 05/19/2019   Non compliance with medical treatment 12/04/2017   Normocytic anemia    With thrombocytosis   Osteoarthritis    Right ureteral stone 2002   S/P lumbar spinal fusion 01/18/2014   S/P lumbar decompressive laminectomy, fusion, and plating for lumbar spinal stenosis on 05/27/2009 by Dr. Eustace .  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 Eustace .     Stroke (cerebrum) ([place])     Tobacco use disorder 04/19/2009   Uterine fibroid    Wears dentures    top   Past Surgical History:  Procedure Laterality Date   ANTERIOR LAT LUMBAR FUSION N/A 01/18/2014   Procedure: ANTERIOR LATERAL LUMBAR FUSION LUMBAR TWO-THREE;  Surgeon: Eustace , MD;  Location: [facility];  Service: Neurosurgery;  Laterality: N/A;   ANTERIOR LUMBAR FUSION  01/18/2014   AV FISTULA PLACEMENT Left 04/20/2019   Procedure: ARTERIOVENOUS (AV) FISTULA CREATION;  Surgeon: [name], MD;  Location: [facility];  Service: Vascular;  Laterality: Left;   BACK SURGERY     BREAST LUMPECTOMY Left 01/2003   BREAST LUMPECTOMY Right 2014   BREAST LUMPECTOMY WITH NEEDLE LOCALIZATION AND AXILLARY SENTINEL LYMPH NODE BX Right 10/13/2012   Procedure: BREAST LUMPECTOMY WITH NEEDLE LOCALIZATION;  Surgeon: [name], MD;  Location: [facility];  Service: General;  Laterality: Right;  Right breast wire localized lumpectomy   INSERTION OF DIALYSIS CATHETER Right 04/20/2019   Procedure: INSERTION OF DIALYSIS CATHETER, right internal jugular;  Surgeon: [name], MD;  Location: [facility];  Service: Vascular;  Laterality: Right;   INSERTION OF DIALYSIS CATHETER Right 09/24/2020   Procedure: INSERTION OF TUNNELED DIALYSIS CATHETER;  Surgeon: Rosetta Posner, MD;  [facility];  Service: Vascular;  Laterality: Right;   IR FLUORO GUIDE CV LINE RIGHT  09/22/2020   IR THORACENTESIS ASP PLEURAL SPACE W/IMG GUIDE  05/19/2019   IR US GUIDE VASC ACCESS LEFT  09/22/2020   IR US GUIDE VASC ACCESS RIGHT  09/22/2020   IR  VENOCAVAGRAM SVC  09/22/2020   LAMINECTOMY  05/27/2009   Lumbar decompressive laminectomy, fusion and plating for lumbar spinal stensosis   LIGATION OF ARTERIOVENOUS  FISTULA Left 09/24/2020   Procedure: LIGATION OF LEFT ARM ARTERIOVENOUS  FISTULA;  Surgeon: Rosetta Posner, MD;  Location: [facility];  Service: Vascular;  Laterality: Left;   LUMBAR LAMINECTOMY/DECOMPRESSION MICRODISCECTOMY  Left 03/23/2013   Procedure: LUMBAR LAMINECTOMY/DECOMPRESSION MICRODISCECTOMY 1 LEVEL;  Surgeon: Eustace , MD;  Location: [facility];  Service: Neurosurgery;  Laterality: Left;  LUMBAR LAMINECTOMY/DECOMPRESSION MICRODISCECTOMY 1 LEVEL   MASTECTOMY, PARTIAL Left 02/26/2003   ; S/P re-excision of cranial and lateral margins 04/19/2003.    RE-EXCISION OF BREAST CANCER,SUPERIOR MARGINS Right 10/27/2012   Procedure: RE-EXCISION OF BREAST CANCER,SUPERIOR and inferior MARGINS;  Surgeon: [name], MD;  Location: [facility];  Service: General;  Laterality: Right;   RE-EXCISION OF BREAST LUMPECTOMY Left 04/2003   TEE WITHOUT CARDIOVERSION N/A 04/04/2019   Procedure: Transesophageal Echocardiogram ([place]);  Surgeon: Wonda Olds, MD;  Location: [facility];  Service: Open Heart Surgery;  Laterality: N/A;   THORACIC AORTIC ANEURYSM REPAIR N/A 04/04/2019   Procedure: THORACIC ASCENDING ANEURYSM REPAIR (AAA)  USING 28 MM X 30 CM HEMASHIELD PLATINUM VASCULAR GRAFT;  Surgeon: Wonda Olds, MD;  [facility];  Service: Open Heart Surgery;  Laterality: N/A;   Patient Active Problem List   Diagnosis Date Noted   Right shoulder pain 06/17/2022   Edema of right upper arm 06/17/2022   Right hemiparesis ([place]) 04/06/2022   Acute cardioembolic stroke ([place]) 123456   Stroke (cerebrum) ([place]) 02/23/2022   Stroke ([place]) 02/23/2022   Hypervolemia associated with renal insufficiency 01/29/2022   Dyslipidemia 01/29/2022   DNR (do not resuscitate) 01/29/2022   Polysubstance abuse ([place]) 01/06/2022   Acute on chronic combined systolic and diastolic CHF (congestive heart failure) ([place]) 01/06/2022   Abdominal pain [id number]   Acute metabolic encephalopathy 123456   Malignant HTN with heart disease, w/o CHF, 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 12/24/2021   History of hepatitis C 12/08/2021   Hypertensive urgency 10/29/2021   Fluid overload 10/29/2021   Acute on chronic HFrEF (heart failure with reduced ejection  fraction) ([place]) 10/29/2021   Chronic anticoagulation 10/29/2021   Prolonged QT interval 10/29/2021   Leukopenia 10/29/2021   SOB (shortness of breath) 09/15/2021   Hypertensive emergency 08/23/2021   Altered mental status    ESRD (end stage renal disease) on dialysis with hyperkalemia and metabolic acidosis with elevated AG  08/12/2021   Malignant hypertension 06/19/2021   Anemia 06/05/2021   Chronic diastolic congestive heart failure ([place]) 04/30/2021   Pure hypercholesterolemia 02/14/2021   Acute pulmonary edema ([place]) 12/23/2020   Acute respiratory failure with hypoxia ([place]) 09/21/2020   Acute renal failure superimposed on chronic kidney disease ([place]) 09/21/2020   Community acquired pneumonia 09/21/2020   Uremia [id number]   Metabolic acidosis with increased anion gap and accumulation of organic acids 09/21/2020   Hypertensive crisis 09/21/2020   Troponin level elevated 08/28/2019   Positive D dimer 08/28/2019   Hyperkalemia 08/28/2019   SVT (supraventricular tachycardia) 08/28/2019   SIRS (systemic inflammatory response syndrome) ([place]) 08/27/2019   Paroxysmal SVT (supraventricular tachycardia) 08/27/2019   Aortic atherosclerosis ([place]) 07/05/2019   Chest pain 07/05/2019   History of CVA (cerebrovascular accident) 05/29/2019   AF (paroxysmal atrial fibrillation) ([place]) 05/29/2019   ESRD on dialysis ([place]) 05/29/2019   Cerebral thrombosis with cerebral infarction 05/22/2019   Malnutrition of moderate degree 05/19/2019  Pleural effusion 05/18/2019   Atrial fibrillation with RVR ([place]) 05/18/2019   S/P aortic aneurysm repair 04/07/2019   Aortic dissection ([place]) 04/04/2019   Dissection of aorta ([place]) 04/03/2019   Non compliance with medical treatment 12/04/2017   Chronic obstructive lung disease ([place]) 01/16/2017   Chronic low back pain 06/22/2016   Insomnia 03/14/2015   S/P lumbar spinal fusion 01/18/2014   Tobacco use disorder 04/19/2009   GERD 09/16/2006    ONSET DATE:  Stroke on 02/22/22  REFERRING DIAG:  R60.0 (ICD-10-CM) - Edema of right upper arm  M25.511 (ICD-10-CM) - Right shoulder pain, unspecified chronicity  G81.91 (ICD-10-CM) - Right hemiparesis ([place])    THERAPY DIAG:  No diagnosis found.  Rationale for Evaluation and Treatment: Rehabilitation  SUBJECTIVE:   SUBJECTIVE STATEMENT: She states ***.   Pt accompanied by: {accompnied:27141}  PERTINENT HISTORY: She was in OP OT therapy from Dec 2023 to Jan 2024 for after stroke hemiparesis, but she was doing worse, increasing edema and weakness in Rt dominant arm/hand, so she was discharged out of caution and referred back to medical providers for safety check to determine why she was progressively getting worse. She was seen by a new doctor and got a whole-arm compression sleeve and reportedly doing better. Unfortunately she was also recently in the ED for missing 2 dialysis txs due to "smoking crack cocaine" at home instead. She continues to be non-compliant with treatment and avoiding harmful substances.   Per referral: "Evaluate and treat. Preference toward Benito Mccreedy for provider. R hemiparesis and RUE edema, work on functional hand use, strengthening and stretching. Has followed up with medical providers and is using a full edema glove with improvement in movement and pain tolerance per family. "   PRECAUTIONS: {Therapy precautions:24002}  WEIGHT BEARING RESTRICTIONS: {Yes ***/No:24003}  PAIN:  Are you having pain? *** in *** Rating: ***/10 at rest now, up to ***/10 at worst in past week   FALLS: Has patient fallen in last 6 months? {fallsyesno:27318}  LIVING ENVIRONMENT: Lives with: {OPRC lives with:25569::"lives with their family"} Lives in: {Lives in:25570} Stairs: {opstairs:27293} Has following equipment at home: {Assistive devices:23999}  PLOF: Independent with basic ADLs, Independent with gait, Independent with transfers, Needs assistance with homemaking, and needs assist  with community mobility  PATIENT GOALS: ***    OBJECTIVE: (All objective assessments below are from initial evaluation on: 07/27/22 unless otherwise specified.)   BASELINE HAND DOMINANCE: Right  ***  ADLs: Overall ADLs: *** Transfers/ambulation related to ADLs: Eating: *** Grooming: *** UB Dressing: *** LB Dressing: *** Toileting: *** Bathing: *** Tub Shower transfers: *** Equipment: {equipment:25573}   IADLs: Shopping: *** Light housekeeping: *** Meal Prep: *** Community mobility: *** Medication management: *** Financial management: *** Handwriting: {OTWRITTENEXPRESSION:25361}  MOBILITY STATUS: {OTMOBILITY:25360}  POSTURE COMMENTS:  {posture:25561} Sitting balance: {sitting balance:25483}  ACTIVITY TOLERANCE: Activity tolerance: ***   FUNCTIONAL OUTCOME MEASURES: Eval: Quck DASH ***% impairment today  (Higher % Score  =  More Impairment)    On 05/13/22: Quick DASH 50% impairment today     UPPER EXTREMITY ROM    Eval: *** Arm:   Pt is *** able to make a full fist.  Pt can *** oppose the thumb to the small finger.   Active ROM Right 06/03/22 Right eval  Shoulder flexion 45*   Shoulder abduction 35*   Shoulder adduction    Shoulder extension    Shoulder internal rotation    Shoulder external rotation    Elbow flexion 90*   Elbow  extension (-10*)   Forearm supination    Forearm pronation    Wrist flexion    Wrist extension    Wrist ulnar deviation    Wrist radial deviation    (Blank rows = not tested)    Hand AROM Rt 05/14/23 Right  Eval  Full Fist Ability (or Gap to Distal Palmar Crease) ~9cm gap (no significant MF motion today)   Thumb Opposition to Small Finger (or Gap) Unable   Thumb Opposition to Base of Small Finger (or Gap)  Unable   (Blank rows = not tested)     UPPER EXTREMITY MMT:     On 05/13/22 she had generally 3-/5MMT proximal strength and distally 1/5 - 2-/5 MMT in wrist, hand, etc., this progressively got worse until  06/03/22.   MMT Right eval  Shoulder flexion   Shoulder abduction   Shoulder adduction   Shoulder extension   Shoulder internal rotation   Shoulder external rotation   Middle trapezius   Lower trapezius   Elbow flexion   Elbow extension   Forearm supination   Forearm pronation   Wrist flexion   Wrist extension   (Blank rows = not tested)  HAND FUNCTION: Eval: Pt with observed weakness in affected Rt hand.  Grip strength: ***#   On 06/03/22: she was totally unable to grip or use her hand   COORDINATION: Eval: Pt with observed coordination deficits in affected UE. {otcoordination:27237}  On 06/03/22: she was totally unable to use her hand for any coordination activities  SENSATION: Eval: {sensation:27233}  EDEMA:  Eval: ***cm figure of 8 around Rt hand today         06/03/22: 46.2cm figure of 8 around Rt hand (much worse again, a day after dialysis)   MUSCLE TONE:   Eval: {UETONE:25567}  COGNITION: Eval: Overall cognitive status: {cognition:24006}  On 05/13/22: Seems somewhat worse today- sleepy, nodding off again, word finding seems difficult and slurring words today.   VISION: At initial evaluation: Subjective report: *** Baseline vision: {OTBASELINEVISION:25363} Visual history: {OTVISUALHISTORY:25364}  VISION ASSESSMENT:  Eval: {visionassessment:27231}  Patient has difficulty with following activities due to following visual impairments: ***  PERCEPTION:  Eval: {Perception:25564}  PRAXIS:  Eval: {Praxis:25565}  OBSERVATIONS:  Eval: ***   TODAY'S TREATMENT:  Post-evaluation treatment: ***   PATIENT EDUCATION: Education details: See tx section above for details  Person educated: Patient and Caregiver ***  Education method: {Education Method:25205} Education comprehension: {Education Comprehension:25206}   HOME EXERCISE PROGRAM: See treatment section for details.     GOALS: Goals reviewed with patient? {yes/no:20286}  SHORT TERM GOALS:  Target date: ***  Pt will demo/state understanding of initial home exercise program (HEP) to improve function, pain, and independence.   Baseline: Needs a plan for rehabilitation  Goal status: INITIAL   2.  Pt will obtain protective, custom or mobilizing orthotic for safety and to improve function.  Baseline: Needs orthotic support  Goal status: {GOALSTATUS:25110}   LONG TERM GOALS: Target date: ***  Pt will improve functional ability by decreased impairment per Quick DASH / PSFS / PRWE assessment to *** or better, for better quality of life. Baseline: *** Goal status: INITIAL  2.  Pt will improve A/ROM in *** to at least ***, to have prerequisite/functional motion for tasks like reach and grasp.  Baseline: *** Goal status: INITIAL  3.  Pt will improve strength in *** to at least *** MMT to have increased functional ability to carry out selfcare and higher-level homecare tasks with no  difficulty Baseline: *** MMT Goal status: INITIAL  4.  Pt will improve coordination skills in ***, as seen by increasing score on *** testing to at least *** to have increased functional ability to carry out fine motor tasks (fasteners, etc.) and more complex, coordinated IADLs (meal prep, sports, etc.).  Baseline: *** Goal status: INITIAL  5.  Pt will decrease pain at rest to ***/10 or better to have better sleep/rest and occupational participation in daily roles. Baseline: ***/10 pain at rest Goal status: INITIAL  6.  Pt will improve grip strength in effected hand to at least ***lbs for functional use at home and in IADLs. Baseline: *** lbs Goal status: INITIAL    ASSESSMENT:  CLINICAL IMPRESSION: Patient is a 72 y.o. female who was seen today for occupational therapy evaluation for Rt sided weakness and poor ability post stroke on ***. ***.   PERFORMANCE DEFICITS: in functional skills including {OT physical skills:25468}, cognitive skills including {OT cognitive skills:25469}, and  psychosocial skills including {OT psychosocial skills:25470}.   IMPAIRMENTS: are limiting patient from {OT performance deficits:25471}.   CO-MORBIDITIES: has co-morbidities such as CVA, cocaine abuse, breast CA, heart disease, LBP, ESRD on HD with non-compliance, Hep C, s/p lumbar fusion, and many more  that affects occupational performance. Patient will benefit from skilled OT to address above impairments and improve overall function.  MODIFICATION OR ASSISTANCE TO COMPLETE EVALUATION: {OT modification:25474}  OT OCCUPATIONAL PROFILE AND HISTORY: {OT PROFILE AND HISTORY:25484}  CLINICAL DECISION MAKING: {OT CDM:25475}  REHAB POTENTIAL: {rehabpotential:25112}  EVALUATION COMPLEXITY: {Evaluation complexity:25115}    PLAN:  OT FREQUENCY: {rehab frequency:25116}  OT DURATION: {rehab duration:25117}  PLANNED INTERVENTIONS: {OT Interventions:25467}  RECOMMENDED OTHER SERVICES: ***  CONSULTED AND AGREED WITH PLAN OF CARE: [id number]  PLAN FOR NEXT SESSION: ***   Benito Mccreedy, OTR/L, CHT 07/23/2022, 2:39 PM

## 2022-07-27 ENCOUNTER — Ambulatory Visit: Payer: 59 | Admitting: Rehabilitative and Restorative Service Providers"

## 2022-07-28 NOTE — Therapy (Deleted)
OUTPATIENT OCCUPATIONAL THERAPY NEURO EVALUATION  Patient Name: Monique Henderson MRN: [id number] DOB:09/14/1950, 72 y.o., female Today's Date: 07/28/2022  PCP: Dr. Redmond Pulling REFERRING PROVIDER: Dr. Tressa Busman  END OF SESSION:   Past Medical History:  Diagnosis Date   AF (paroxysmal atrial fibrillation) ([place]) 05/29/2019   on Coumadin   Aortic atherosclerosis ([place]) 07/05/2019   Aortic dissection ([place]) 04/04/2019   s/p repair   Bone spur 2008   Right calcaneal foot spur   Breast cancer ([place]) 2004   Ductal carcinoma in situ of the left breast; S/P left partial mastectomy 02/26/2003; S/P re-excision of cranial and lateral margins11/18/2004.radiation   Carotid artery disease (HCC)    Cerebral thrombosis with cerebral infarction 05/22/2019   Chronic HFrEF (heart failure with reduced ejection fraction) (HCC)    Chronic low back pain 06/22/2016   Chronic obstructive lung disease ([place]) 01/16/2017   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 Autumn Messing; S/P re-excision of superior and inferior margins 10/27/2012.    ESRD on hemodialysis ([place]) 05/29/2019   Essential hypertension 09/16/2006   GERD 09/16/2006   Hepatitis C    treated and RNA confirmed not detectable 01/2017   Insomnia 03/14/2015   Malnutrition of moderate degree 05/19/2019   Non compliance with medical treatment 12/04/2017   Normocytic anemia    With thrombocytosis   Osteoarthritis    Right ureteral stone 2002   S/P lumbar spinal fusion 01/18/2014   S/P lumbar decompressive laminectomy, fusion, and plating for lumbar spinal stenosis on 05/27/2009 by Dr. Eustace Moore.  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 Eustace Moore.     Stroke (cerebrum) ([place])    Tobacco use disorder  04/19/2009   Uterine fibroid    Wears dentures    top   Past Surgical History:  Procedure Laterality Date   ANTERIOR LAT LUMBAR FUSION N/A 01/18/2014   Procedure: ANTERIOR LATERAL LUMBAR FUSION LUMBAR TWO-THREE;  Surgeon: Eustace Moore, MD;  Location: [place] [facility];  Service: Neurosurgery;  Laterality: N/A;   ANTERIOR LUMBAR FUSION  01/18/2014   AV FISTULA PLACEMENT Left 04/20/2019   Procedure: ARTERIOVENOUS (AV) FISTULA CREATION;  Surgeon: [name], MD;  Location: [facility];  Service: Vascular;  Laterality: Left;   BACK SURGERY     BREAST LUMPECTOMY Left 01/2003   BREAST LUMPECTOMY Right 2014   BREAST LUMPECTOMY WITH NEEDLE LOCALIZATION AND AXILLARY SENTINEL LYMPH NODE BX Right 10/13/2012   Procedure: BREAST LUMPECTOMY WITH NEEDLE LOCALIZATION;  Surgeon: [name], MD;  Location: [place];  Service: General;  Laterality: Right;  Right breast wire localized lumpectomy   INSERTION OF DIALYSIS CATHETER Right 04/20/2019   Procedure: INSERTION OF DIALYSIS CATHETER, right internal jugular;  Surgeon: [name], MD;  Location: [facility];  Service: Vascular;  Laterality: Right;   INSERTION OF DIALYSIS CATHETER Right 09/24/2020   Procedure: INSERTION OF TUNNELED DIALYSIS CATHETER;  Surgeon: Rosetta Posner, MD;  Location: [facility];  Service: Vascular;  Laterality: Right;   IR FLUORO GUIDE CV LINE RIGHT  09/22/2020   IR THORACENTESIS ASP PLEURAL SPACE W/IMG GUIDE  05/19/2019   IR US GUIDE VASC ACCESS LEFT  09/22/2020   IR US GUIDE VASC ACCESS RIGHT  09/22/2020   IR VENOCAVAGRAM SVC  09/22/2020  LAMINECTOMY  05/27/2009   Lumbar decompressive laminectomy, fusion and plating for lumbar spinal stensosis   LIGATION OF ARTERIOVENOUS  FISTULA Left 09/24/2020   Procedure: LIGATION OF LEFT ARM ARTERIOVENOUS  FISTULA;  Surgeon: Rosetta Posner, MD;  Location: [facility];  Service: Vascular;  Laterality: Left;   LUMBAR LAMINECTOMY/DECOMPRESSION MICRODISCECTOMY Left 03/23/2013    Procedure: LUMBAR LAMINECTOMY/DECOMPRESSION MICRODISCECTOMY 1 LEVEL;  Surgeon: Eustace Moore, MD;  Location: [place] [facility];  Service: Neurosurgery;  Laterality: Left;  LUMBAR LAMINECTOMY/DECOMPRESSION MICRODISCECTOMY 1 LEVEL   MASTECTOMY, PARTIAL Left 02/26/2003   ; S/P re-excision of cranial and lateral margins 04/19/2003.    RE-EXCISION OF BREAST CANCER,SUPERIOR MARGINS Right 10/27/2012   Procedure: RE-EXCISION OF BREAST CANCER,SUPERIOR and inferior MARGINS;  Surgeon: [name], MD;  Location: [facility];  Service: General;  Laterality: Right;   RE-EXCISION OF BREAST LUMPECTOMY Left 04/2003   TEE WITHOUT CARDIOVERSION N/A 04/04/2019   Procedure: Transesophageal Echocardiogram ([place]);  Surgeon: Wonda Olds, MD;  Location: [facility];  Service: Open Heart Surgery;  Laterality: N/A;   THORACIC AORTIC ANEURYSM REPAIR N/A 04/04/2019   Procedure: THORACIC ASCENDING ANEURYSM REPAIR (AAA)  USING 28 MM X 30 CM HEMASHIELD PLATINUM VASCULAR GRAFT;  Surgeon: Wonda Olds, MD;  [facility];  Service: Open Heart Surgery;  Laterality: N/A;   Patient Active Problem List   Diagnosis Date Noted   Right shoulder pain 06/17/2022   Edema of right upper arm 06/17/2022   Right hemiparesis ([place]) 04/06/2022   Acute cardioembolic stroke ([place]) 123456   Stroke (cerebrum) ([place]) 02/23/2022   Stroke ([place]) 02/23/2022   Hypervolemia associated with renal insufficiency 01/29/2022   Dyslipidemia 01/29/2022   DNR (do not resuscitate) 01/29/2022   Polysubstance abuse ([place]) 01/06/2022   Acute on chronic combined systolic and diastolic CHF (congestive heart failure) ([place]) 01/06/2022   Abdominal pain [id number]   Acute metabolic encephalopathy 123456   Malignant HTN with heart disease, w/o CHF, 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 12/24/2021   History of hepatitis C 12/08/2021   Hypertensive urgency 10/29/2021   Fluid overload 10/29/2021   Acute on chronic HFrEF (heart failure with reduced ejection fraction) ([place])  10/29/2021   Chronic anticoagulation 10/29/2021   Prolonged QT interval 10/29/2021   Leukopenia 10/29/2021   SOB (shortness of breath) 09/15/2021   Hypertensive emergency 08/23/2021   Altered mental status    ESRD (end stage renal disease) on dialysis with hyperkalemia and metabolic acidosis with elevated AG  08/12/2021   Malignant hypertension 06/19/2021   Anemia 06/05/2021   Chronic diastolic congestive heart failure ([place]) 04/30/2021   Pure hypercholesterolemia 02/14/2021   Acute pulmonary edema ([place]) 12/23/2020   Acute respiratory failure with hypoxia ([place]) 09/21/2020   Acute renal failure superimposed on chronic kidney disease ([place]) 09/21/2020   Community acquired pneumonia 09/21/2020   Uremia [id number]   Metabolic acidosis with increased anion gap and accumulation of organic acids 09/21/2020   Hypertensive crisis 09/21/2020   Troponin level elevated 08/28/2019   Positive D dimer 08/28/2019   Hyperkalemia 08/28/2019   SVT (supraventricular tachycardia) 08/28/2019   SIRS (systemic inflammatory response syndrome) ([place]) 08/27/2019   Paroxysmal SVT (supraventricular tachycardia) 08/27/2019   Aortic atherosclerosis ([place]) 07/05/2019   Chest pain 07/05/2019   History of CVA (cerebrovascular accident) 05/29/2019   AF (paroxysmal atrial fibrillation) ([place]) 05/29/2019   ESRD on dialysis ([place]) 05/29/2019   Cerebral thrombosis with cerebral infarction 05/22/2019   Malnutrition of moderate degree 05/19/2019   Pleural effusion 05/18/2019   Atrial  fibrillation with RVR ([place]) 05/18/2019   S/P aortic aneurysm repair 04/07/2019   Aortic dissection ([place]) 04/04/2019   Dissection of aorta ([place]) 04/03/2019   Non compliance with medical treatment 12/04/2017   Chronic obstructive lung disease ([place]) 01/16/2017   Chronic low back pain 06/22/2016   Insomnia 03/14/2015   S/P lumbar spinal fusion 01/18/2014   Tobacco use disorder 04/19/2009   GERD 09/16/2006    ONSET DATE: 07/09/22- referral  date  REFERRING DIAG: R60.0 (ICD-10-CM) - Edema of right upper arm M25.511 (ICD-10-CM) - Right shoulder pain, unspecified chronicity G81.91 (ICD-10-CM) - Right hemiparesis ([place])  THERAPY DIAG:  No diagnosis found.  Rationale for Evaluation and Treatment: Rehabilitation  SUBJECTIVE:   SUBJECTIVE STATEMENT: *** Pt accompanied by: {accompnied:27141}  PERTINENT HISTORY: She suffered a stroke on 02/22/22 to her Lt posterior-frontal parietal and anterior-parietal areas causing dysphagia, right hemiplegia, speech issues, and cognitive problems. She spent ~2 weeks in the hospital, in inpatient rehab. At discharge from inpatient rehab, she was noted to have: no right handed grip, 75% intelligible speech, SBA with no device for transfers, dysphagia, HTN, HLD, tachycardia, A-fib, CKD, and chest pain (other than cardiac).    PRECAUTIONS: {Therapy precautions:24002}  WEIGHT BEARING RESTRICTIONS: {Yes ***/No:24003}  PAIN:  Are you having pain? {OPRCPAIN:27236}  FALLS: Has patient fallen in last 6 months? {fallsyesno:27318}  LIVING ENVIRONMENT: Lives with: {OPRC lives with:25569::"lives with their family"} Lives in: {Lives in:25570} Stairs: {opstairs:27293} Has following equipment at home: {Assistive devices:23999}  PLOF: {PLOF:24004}  PATIENT GOALS: ***  OBJECTIVE:   HAND DOMINANCE: {MISC; OT HAND DOMINANCE:[phone]}  ADLs: Overall ADLs: *** Transfers/ambulation related to ADLs: Eating: *** Grooming: *** UB Dressing: *** LB Dressing: *** Toileting: *** Bathing: *** Tub Shower transfers: *** Equipment: {equipment:25573}  IADLs: Shopping: *** Light housekeeping: *** Meal Prep: *** Community mobility: *** Medication management: *** Financial management: *** Handwriting: {OTWRITTENEXPRESSION:25361}  MOBILITY STATUS: {OTMOBILITY:25360}  POSTURE COMMENTS:  {posture:25561} Sitting balance: {sitting balance:25483}  ACTIVITY TOLERANCE: Activity tolerance:  ***  FUNCTIONAL OUTCOME MEASURES: {OTFUNCTIONALMEASURES:27238}  UPPER EXTREMITY ROM:    {AROM/PROM:27142} ROM Right eval Left eval  Shoulder flexion    Shoulder abduction    Shoulder adduction    Shoulder extension    Shoulder internal rotation    Shoulder external rotation    Elbow flexion    Elbow extension    Wrist flexion    Wrist extension    Wrist ulnar deviation    Wrist radial deviation    Wrist pronation    Wrist supination    (Blank rows = not tested)  UPPER EXTREMITY MMT:     MMT Right eval Left eval  Shoulder flexion    Shoulder abduction    Shoulder adduction    Shoulder extension    Shoulder internal rotation    Shoulder external rotation    Middle trapezius    Lower trapezius    Elbow flexion    Elbow extension    Wrist flexion    Wrist extension    Wrist ulnar deviation    Wrist radial deviation    Wrist pronation    Wrist supination    (Blank rows = not tested)  HAND FUNCTION: {handfunction:27230}  COORDINATION: {otcoordination:27237}  SENSATION: {sensation:27233}  EDEMA: ***  MUSCLE TONE: {UETONE:25567}  COGNITION: Overall cognitive status: {cognition:24006}  VISION: Subjective report: *** Baseline vision: {OTBASELINEVISION:25363} Visual history: {OTVISUALHISTORY:25364}  VISION ASSESSMENT: {visionassessment:27231}  Patient has difficulty with following activities due to following visual impairments: ***  PERCEPTION: {Perception:25564}  PRAXIS: {Praxis:25565}  OBSERVATIONS: ***  TODAY'S TREATMENT:                                                                                                                              DATE: ***   PATIENT EDUCATION: Education details: *** Person educated: {Person educated:25204} Education method: {Education Method:25205} Education comprehension: {Education Comprehension:25206}  HOME EXERCISE PROGRAM: ***   GOALS: Goals reviewed with patient? {yes/no:20286}  SHORT TERM  GOALS: Target date: ***  *** Baseline: Goal status: {GOALSTATUS:25110}  2.  *** Baseline:  Goal status: {GOALSTATUS:25110}  3.  *** Baseline:  Goal status: {GOALSTATUS:25110}  4.  *** Baseline:  Goal status: {GOALSTATUS:25110}  5.  *** Baseline:  Goal status: {GOALSTATUS:25110}  6.  *** Baseline:  Goal status: {GOALSTATUS:25110}  LONG TERM GOALS: Target date: ***  *** Baseline:  Goal status: {GOALSTATUS:25110}  2.  *** Baseline:  Goal status: {GOALSTATUS:25110}  3.  *** Baseline:  Goal status: {GOALSTATUS:25110}  4.  *** Baseline:  Goal status: {GOALSTATUS:25110}  5.  *** Baseline:  Goal status: {GOALSTATUS:25110}  6.  *** Baseline:  Goal status: {GOALSTATUS:25110}  ASSESSMENT:  CLINICAL IMPRESSION: Patient is a *** y.o. *** who was seen today for occupational therapy evaluation for ***.   PERFORMANCE DEFICITS: in functional skills including {OT physical skills:25468}, cognitive skills including {OT cognitive skills:25469}, and psychosocial skills including {OT psychosocial skills:25470}.   IMPAIRMENTS: are limiting patient from {OT performance deficits:25471}.   CO-MORBIDITIES: {Comorbidities:25485} that affects occupational performance. Patient will benefit from skilled OT to address above impairments and improve overall function.  MODIFICATION OR ASSISTANCE TO COMPLETE EVALUATION: {OT modification:25474}  OT OCCUPATIONAL PROFILE AND HISTORY: {OT PROFILE AND HISTORY:25484}  CLINICAL DECISION MAKING: {OT CDM:25475}  REHAB POTENTIAL: {rehabpotential:25112}  EVALUATION COMPLEXITY: {Evaluation complexity:25115}    PLAN:  OT FREQUENCY: {rehab frequency:25116}  OT DURATION: {rehab duration:25117}  PLANNED INTERVENTIONS: {OT Interventions:25467}  RECOMMENDED OTHER SERVICES: ***  CONSULTED AND AGREED WITH PLAN OF CARE: [id number]  PLAN FOR NEXT SESSION: ***   Theone Murdoch, OT 07/28/2022, 9:51 AM

## 2022-07-29 ENCOUNTER — Ambulatory Visit: Payer: 59 | Attending: Internal Medicine | Admitting: Occupational Therapy

## 2022-08-06 ENCOUNTER — Other Ambulatory Visit: Payer: Self-pay | Admitting: Family Medicine

## 2022-08-06 NOTE — Telephone Encounter (Signed)
Requested medication (s) are due for refill today:   Yes  Requested medication (s) are on the active medication list:   Yes  Future visit scheduled:   Yes in 2 months   Last ordered: 05/01/2022 #60, 1 refill  Returned because it was prescribed by a provider in the hospital.      Requested Prescriptions  Pending Prescriptions Disp Refills   pantoprazole (PROTONIX) 40 MG tablet 60 tablet 1    Sig: Take 1 tablet (40 mg total) by mouth 2 (two) times daily.     Gastroenterology: Proton Pump Inhibitors Passed - 08/06/2022 10:03 AM      Passed - Valid encounter within last 12 months    Recent Outpatient Visits           2 weeks ago Encounter for Medicare annual wellness exam   [place] [facility], MD   2 months ago Essential hypertension   [place] [facility], MD   3 months ago Essential hypertension   [place] [facility], MD   4 months ago Essential hypertension   [place] [facility], Clyde Canterbury, MD   2 years ago Anemia due to stage 5 chronic kidney disease, not on chronic dialysis [facility])   [facility] at [facility], Delfino Lovett, NP       Future Appointments             In 1 week Irish Lack, Charlann Lange, MD [place] at [facility], [place]   In 2 months Dorna Mai, MD [place] at [facility]

## 2022-08-06 NOTE — Telephone Encounter (Signed)
Medication Refill - Medication: pantoprazole (PROTONIX) 40 MG tablet   Has the patient contacted their pharmacy? Yes.     Preferred Pharmacy (with phone number or street name):  [facility], [place] Phone: [phone]  Fax: [phone]     Has the patient been seen for an appointment in the last year OR does the patient have an upcoming appointment? Yes.    Please assist patient further as her stomach as been really bothering her and she has been out of this medication which really helps her. She was prescribed this last while in the hospital. Please assist patient further.

## 2022-08-07 NOTE — Telephone Encounter (Signed)
Requested medications are due for refill today.  yes  Requested medications are on the active medications list.  yes  Last refill. 04/20/2022 #90 [id number]  Future visit scheduled.   yes  Notes to clinic.  Abnormal labs    Requested Prescriptions  Pending Prescriptions Disp Refills   rosuvastatin (CRESTOR) 10 MG tablet [Pharmacy Med Name: ROSUVASTATIN CALCIUM 10 MG ORAL TABLET] 90 tablet 0    Sig: TAKE 1 TABLET (10 MG TOTAL) BY MOUTH DAILY FOR CHOLESTEROL     Cardiovascular:  Antilipid - Statins 2 Failed - 08/06/2022  5:18 PM      Failed - Cr in normal range and within 360 days    Creatinine  Date Value Ref Range Status  09/28/2012 1.2 (H) 0.6 - 1.1 mg/dL Final   Creat  Date Value Ref Range Status  01/18/2017 2.09 (H) 0.50 - 0.99 mg/dL Final    Comment:      For patients > or = 70 years of age: The upper reference limit for Creatinine is approximately 13% higher for people identified as African-American.      Creatinine, Ser  Date Value Ref Range Status  07/04/2022 14.52 (H) 0.44 - 1.00 mg/dL Final   Creatinine,U  Date Value Ref Range Status  01/17/2009 276.0 mg/dL Final   Creatinine, Urine  Date Value Ref Range Status  08/29/2008 243.0 mg/dL Final         Failed - Lipid Panel in normal range within the last 12 months    Cholesterol, Total  Date Value Ref Range Status  05/13/2020 167 100 - 199 mg/dL Final   Cholesterol  Date Value Ref Range Status  02/24/2022 104 0 - 200 mg/dL Final   LDL Chol Calc (NIH)  Date Value Ref Range Status  05/13/2020 100 (H) 0 - 99 mg/dL Final   LDL Cholesterol  Date Value Ref Range Status  02/24/2022 46 0 - 99 mg/dL Final    Comment:           Total Cholesterol/HDL:CHD Risk Coronary Heart Disease Risk Table                     Men   Women  1/2 Average Risk   3.4   3.3  Average Risk       5.0   4.4  2 X Average Risk   9.6   7.1  3 X Average Risk  23.4   11.0        Use the calculated Patient Ratio above and the CHD Risk  Table to determine the patient's CHD Risk.        ATP III CLASSIFICATION (LDL):  <100     mg/dL   Optimal  100-129  mg/dL   Near or Above                    Optimal  130-159  mg/dL   Borderline  160-189  mg/dL   High  >190     mg/dL   Very High Performed at [facility] [address], [place], [place] [address]    HDL  Date Value Ref Range Status  02/24/2022 49 >40 mg/dL Final  05/13/2020 39 (L) >39 mg/dL Final   Triglycerides  Date Value Ref Range Status  02/24/2022 43 <150 mg/dL Final         Passed - Patient is not pregnant      Passed - Valid encounter within last 12 months  Recent Outpatient Visits           2 weeks ago Encounter for Medicare annual wellness exam   [place] [facility], MD   2 months ago Essential hypertension   [place] [facility], MD   3 months ago Essential hypertension   [place] [facility], MD   4 months ago Essential hypertension   [place] [facility] at [facility], Clyde Canterbury, MD   2 years ago Anemia due to stage 5 chronic kidney disease, not on chronic dialysis [facility])   [facility] at [facility], Delfino Lovett, NP       Future Appointments             In 1 week Jettie Booze, MD [place] at [facility] [place]   In 2 months Dorna Mai, MD [facility] at [place] Refills   isosorbide mononitrate (IMDUR) 30 MG 24 hr tablet [Pharmacy Med Name: ISOSORBIDE MONONITRATE ER 30 MG ORAL TABLET EXTENDED RELEASE 24 HOUR] 90 tablet 1    Sig: TAKE 1 TABLET (30 MG TOTAL) BY MOUTH DAILY.     Cardiovascular:  Nitrates Passed - 08/06/2022  5:18 PM      Passed - Last BP in normal range    BP Readings from Last 1 Encounters:  07/22/22 131/76         Passed - Last Heart Rate in normal range    Pulse Readings from Last 1  Encounters:  07/22/22 79         Passed - Valid encounter within last 12 months    Recent Outpatient Visits           2 weeks ago Encounter for [employer] annual wellness exam   [place] [facility] at [facility], MD   2 months ago Essential hypertension   [place] [facility] at [facility], MD   3 months ago Essential hypertension   [place] [facility] at [facility], MD   4 months ago Essential hypertension    Primary Care at [facility], Clyde Canterbury, MD   2 years ago Anemia due to stage 5 chronic kidney disease, not on chronic dialysis [facility])   [facility] at [facility], Delfino Lovett, NP       Future Appointments             In 1 week Irish Lack, Charlann Lange, MD [place] at [facility], [place]   In 2 months Dorna Mai, MD [place] at [facility]

## 2022-08-07 NOTE — Telephone Encounter (Signed)
Requested Prescriptions  Pending Prescriptions Disp Refills   isosorbide mononitrate (IMDUR) 30 MG 24 hr tablet [Pharmacy Med Name: ISOSORBIDE MONONITRATE ER 30 MG ORAL TABLET EXTENDED RELEASE 24 HOUR] 90 tablet 1    Sig: TAKE 1 TABLET (30 MG TOTAL) BY MOUTH DAILY.     Cardiovascular:  Nitrates Passed - 08/06/2022  5:18 PM      Passed - Last BP in normal range    BP Readings from Last 1 Encounters:  07/22/22 131/76         Passed - Last Heart Rate in normal range    Pulse Readings from Last 1 Encounters:  07/22/22 79         Passed - Valid encounter within last 12 months    Recent Outpatient Visits           2 weeks ago Encounter for [employer] annual wellness exam   [place] [facility], MD   2 months ago Essential hypertension   [place] [facility] at [facility], MD   3 months ago Essential hypertension   [place] [facility] at [facility], MD   4 months ago Essential hypertension   [place] [facility] at [facility], Clyde Canterbury, MD   2 years ago Anemia due to stage 5 chronic kidney disease, not on chronic dialysis [facility])   [facility] at [facility], Delfino Lovett, NP       Future Appointments             In 1 week Jettie Booze, MD [place] at [facility], [place]   In 2 months Dorna Mai, MD [facility] at [place] Refills   rosuvastatin (CRESTOR) 10 MG tablet [Pharmacy Med Name: ROSUVASTATIN CALCIUM 10 MG ORAL TABLET] 90 tablet 0    Sig: TAKE 1 TABLET (10 MG TOTAL) BY MOUTH DAILY FOR CHOLESTEROL     Cardiovascular:  Antilipid - Statins 2 Failed - 08/06/2022  5:18 PM      Failed - Cr in normal range and within 360 days    Creatinine  Date Value Ref Range Status  09/28/2012 1.2 (H) 0.6 - 1.1 mg/dL Final   Creat  Date Value Ref Range Status  01/18/2017 2.09 (H) 0.50 - 0.99 mg/dL Final     Comment:      For patients > or = 70 years of age: The upper reference limit for Creatinine is approximately 13% higher for people identified as African-American.      Creatinine, Ser  Date Value Ref Range Status  07/04/2022 14.52 (H) 0.44 - 1.00 mg/dL Final   Creatinine,U  Date Value Ref Range Status  01/17/2009 276.0 mg/dL Final   Creatinine, Urine  Date Value Ref Range Status  08/29/2008 243.0 mg/dL Final         Failed - Lipid Panel in normal range within the last 12 months    Cholesterol, Total  Date Value Ref Range Status  05/13/2020 167 100 - 199 mg/dL Final   Cholesterol  Date Value Ref Range Status  02/24/2022 104 0 - 200 mg/dL Final   LDL Chol Calc (NIH)  Date Value Ref Range Status  05/13/2020 100 (H) 0 - 99 mg/dL Final   LDL Cholesterol  Date Value Ref Range Status  02/24/2022 46 0 - 99 mg/dL Final    Comment:  Total Cholesterol/HDL:CHD Risk Coronary Heart Disease Risk Table                     Men   Women  1/2 Average Risk   3.4   3.3  Average Risk       5.0   4.4  2 X Average Risk   9.6   7.1  3 X Average Risk  23.4   11.0        Use the calculated Patient Ratio above and the CHD Risk Table to determine the patient's CHD Risk.        ATP III CLASSIFICATION (LDL):  <100     mg/dL   Optimal  100-129  mg/dL   Near or Above                    Optimal  130-159  mg/dL   Borderline  160-189  mg/dL   High  >190     mg/dL   Very High Performed at [facility] [address], [place], [place] [address]    HDL  Date Value Ref Range Status  02/24/2022 49 >40 mg/dL Final  05/13/2020 39 (L) >39 mg/dL Final   Triglycerides  Date Value Ref Range Status  02/24/2022 43 <150 mg/dL Final         Passed - Patient is not pregnant      Passed - Valid encounter within last 12 months    Recent Outpatient Visits           2 weeks ago Encounter for [employer] annual wellness exam   [place] [facility] at [facility], MD   2 months ago Essential hypertension   [place] [facility] at [facility], MD   3 months ago Essential hypertension   [place] [facility] at [facility], MD   4 months ago Essential hypertension   [place] [facility] at [facility], Clyde Canterbury, MD   2 years ago Anemia due to stage 5 chronic kidney disease, not on chronic dialysis [facility])   [facility] at [facility], Delfino Lovett, NP       Future Appointments             In 1 week [place], Charlann Lange, MD [place] at [facility] [place]   In 2 months Dorna Mai, MD [place] at [facility]

## 2022-08-10 ENCOUNTER — Encounter: Payer: Self-pay | Admitting: Occupational Therapy

## 2022-08-10 ENCOUNTER — Ambulatory Visit: Payer: 59 | Attending: Physical Medicine and Rehabilitation | Admitting: Occupational Therapy

## 2022-08-10 DIAGNOSIS — R0602 Shortness of breath: Secondary | ICD-10-CM | POA: Diagnosis not present

## 2022-08-10 DIAGNOSIS — M25611 Stiffness of right shoulder, not elsewhere classified: Secondary | ICD-10-CM | POA: Insufficient documentation

## 2022-08-10 DIAGNOSIS — M6281 Muscle weakness (generalized): Secondary | ICD-10-CM | POA: Insufficient documentation

## 2022-08-10 DIAGNOSIS — M25511 Pain in right shoulder: Secondary | ICD-10-CM | POA: Insufficient documentation

## 2022-08-10 DIAGNOSIS — R278 Other lack of coordination: Secondary | ICD-10-CM | POA: Insufficient documentation

## 2022-08-10 DIAGNOSIS — I132 Hypertensive heart and chronic kidney disease with heart failure and with stage 5 chronic kidney disease, or end stage renal disease: Secondary | ICD-10-CM | POA: Diagnosis not present

## 2022-08-10 DIAGNOSIS — M25641 Stiffness of right hand, not elsewhere classified: Secondary | ICD-10-CM | POA: Insufficient documentation

## 2022-08-10 DIAGNOSIS — R600 Localized edema: Secondary | ICD-10-CM | POA: Insufficient documentation

## 2022-08-10 DIAGNOSIS — G8191 Hemiplegia, unspecified affecting right dominant side: Secondary | ICD-10-CM | POA: Insufficient documentation

## 2022-08-10 DIAGNOSIS — M25631 Stiffness of right wrist, not elsewhere classified: Secondary | ICD-10-CM | POA: Insufficient documentation

## 2022-08-10 DIAGNOSIS — M79601 Pain in right arm: Secondary | ICD-10-CM | POA: Insufficient documentation

## 2022-08-10 DIAGNOSIS — M79641 Pain in right hand: Secondary | ICD-10-CM | POA: Insufficient documentation

## 2022-08-10 NOTE — Patient Instructions (Signed)
  Wear your compression glove to next visit.    AROM: Finger Flexion / Extension   Actively bend fingers of  hand. Start with knuckles furthest from palm, and slowly make a fist. Hold __5__ seconds. Relax. Then straighten fingers as far as possible. Repeat _10-15___ times per set.  Do _4-6___ sessions per day. Repeat a second time assisting with your left hand 10 reps 3x day  Copyright  VHI. All rights reserved.     Wrist, AROM Flex / Extend    Elbows bent to 90 supported on table with other hand  Use armrests on chair if more comfortable. Bend wrists downward. Then bend wrists upward. Hold each position ___ seconds. Repeat _10__ times per session. Do _3-4__ sessions per day.  Copyright  VHI. All rights reserved.    AROM: Elbow Flexion / Extension    Gently bend elbow as far as possible. Then straighten arm as far as possible. Repeat _10-15___ times per set. Do _4-6___ sessions per day.    Elbow / Wrist Supination / Pronation   Bend elbow(s) to 90 and hold close to body. Turn palm(s) up. Then turn palm(s) down. Keep wrist straight. Repeat sequence _10-15___ times per session. Do _3-4__ sessions per day.

## 2022-08-10 NOTE — Therapy (Signed)
OUTPATIENT OCCUPATIONAL THERAPY NEURO EVALUATION  Patient Name: Monique Henderson MRN: [id number] DOB:1951/01/19, 72 y.o., female Today's Date: 08/10/2022  PCP: Dr. Redmond Pulling REFERRING PROVIDER: Durel Salts, DO  END OF SESSION:  OT End of Session - 08/10/22 1532     Visit Number 1    Number of Visits 25    Date for OT Re-Evaluation 11/02/22    Authorization Type UHC Medicare    Authorization - Visit Number 1    Progress Note Due on Visit 10    OT Start Time [id number]    OT Stop Time [id number]    OT Time Calculation (min) 50 min             Past Medical History:  Diagnosis Date   AF (paroxysmal atrial fibrillation) ([place]) 05/29/2019   on Coumadin   Aortic atherosclerosis ([place]) 07/05/2019   Aortic dissection ([place]) 04/04/2019   s/p repair   Bone spur 2008   Right calcaneal foot spur   Breast cancer ([place]) 2004   Ductal carcinoma in situ of the left breast; S/P left partial mastectomy 02/26/2003; S/P re-excision of cranial and lateral margins11/18/2004.radiation   Carotid artery disease (HCC)    Cerebral thrombosis with cerebral infarction 05/22/2019   Chronic HFrEF (heart failure with reduced ejection fraction) (HCC)    Chronic low back pain 06/22/2016   Chronic obstructive lung disease ([place]) 01/16/2017   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 Autumn Messing; S/P re-excision of superior and inferior margins 10/27/2012.    ESRD on hemodialysis ([place]) 05/29/2019   Essential hypertension 09/16/2006   GERD 09/16/2006   Hepatitis C    treated and RNA confirmed not detectable 01/2017   Insomnia 03/14/2015   Malnutrition of moderate degree 05/19/2019   Non compliance with medical treatment 12/04/2017   Normocytic anemia    With thrombocytosis   Osteoarthritis    Right ureteral stone 2002   S/P lumbar spinal fusion 01/18/2014   S/P lumbar decompressive laminectomy, fusion, and plating for lumbar spinal stenosis on 05/27/2009 by  Dr. Eustace Moore.  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 Eustace Moore.     Stroke (cerebrum) ([place])    Tobacco use disorder 04/19/2009   Uterine fibroid    Wears dentures    top   Past Surgical History:  Procedure Laterality Date   ANTERIOR LAT LUMBAR FUSION N/A 01/18/2014   Procedure: ANTERIOR LATERAL LUMBAR FUSION LUMBAR TWO-THREE;  Surgeon: Eustace Moore, MD;  Location: [place] [facility];  Service: Neurosurgery;  Laterality: N/A;   ANTERIOR LUMBAR FUSION  01/18/2014   AV FISTULA PLACEMENT Left 04/20/2019   Procedure: ARTERIOVENOUS (AV) FISTULA CREATION;  Surgeon: [name], MD;  Location: [facility];  Service: Vascular;  Laterality: Left;   BACK SURGERY     BREAST LUMPECTOMY Left 01/2003   BREAST LUMPECTOMY Right 2014   BREAST LUMPECTOMY WITH NEEDLE LOCALIZATION AND AXILLARY SENTINEL LYMPH NODE BX Right 10/13/2012   Procedure: BREAST LUMPECTOMY WITH NEEDLE LOCALIZATION;  Surgeon: [name], MD;  Location: [place] [facility];  Service: General;  Laterality: Right;  Right breast wire localized lumpectomy   INSERTION OF DIALYSIS CATHETER Right 04/20/2019   Procedure: INSERTION OF DIALYSIS CATHETER, right internal jugular;  Surgeon: [name], MD;  [facility];  Service: Vascular;  Laterality: Right;   INSERTION OF DIALYSIS CATHETER Right 09/24/2020   Procedure: INSERTION OF TUNNELED DIALYSIS CATHETER;  Surgeon: Rosetta Posner, MD;  Location: [place];  Service: Vascular;  Laterality: Right;   IR FLUORO GUIDE CV LINE RIGHT  09/22/2020   IR THORACENTESIS ASP PLEURAL SPACE W/IMG GUIDE  05/19/2019   IR US GUIDE VASC ACCESS LEFT  09/22/2020   IR US GUIDE VASC ACCESS RIGHT  09/22/2020   IR VENOCAVAGRAM SVC  09/22/2020   LAMINECTOMY  05/27/2009   Lumbar decompressive  laminectomy, fusion and plating for lumbar spinal stensosis   LIGATION OF ARTERIOVENOUS  FISTULA Left 09/24/2020   Procedure: LIGATION OF LEFT ARM ARTERIOVENOUS  FISTULA;  Surgeon: Rosetta Posner, MD;  Location: [place];  Service: Vascular;  Laterality: Left;   LUMBAR LAMINECTOMY/DECOMPRESSION MICRODISCECTOMY Left 03/23/2013   Procedure: LUMBAR LAMINECTOMY/DECOMPRESSION MICRODISCECTOMY 1 LEVEL;  Surgeon: Eustace Moore, MD;  Location: [place] [facility];  Service: Neurosurgery;  Laterality: Left;  LUMBAR LAMINECTOMY/DECOMPRESSION MICRODISCECTOMY 1 LEVEL   MASTECTOMY, PARTIAL Left 02/26/2003   ; S/P re-excision of cranial and lateral margins 04/19/2003.    RE-EXCISION OF BREAST CANCER,SUPERIOR MARGINS Right 10/27/2012   Procedure: RE-EXCISION OF BREAST CANCER,SUPERIOR and inferior MARGINS;  Surgeon: [name], MD;  Location: [place];  Service: General;  Laterality: Right;   RE-EXCISION OF BREAST LUMPECTOMY Left 04/2003   TEE WITHOUT CARDIOVERSION N/A 04/04/2019   Procedure: Transesophageal Echocardiogram (Tee);  Surgeon: Wonda Olds, MD;  Location: [place];  Service: Open Heart Surgery;  Laterality: N/A;   THORACIC AORTIC ANEURYSM REPAIR N/A 04/04/2019   Procedure: THORACIC ASCENDING ANEURYSM REPAIR (AAA)  USING 28 MM X 30 CM HEMASHIELD PLATINUM VASCULAR GRAFT;  Surgeon: Wonda Olds, MD;  [facility];  Service: Open Heart Surgery;  Laterality: N/A;   Patient Active Problem List   Diagnosis Date Noted   Right shoulder pain 06/17/2022   Edema of right upper arm 06/17/2022   Right hemiparesis ([place]) 04/06/2022   Acute cardioembolic stroke ([place]) 123456   Stroke (cerebrum) ([place]) 02/23/2022   Stroke ([place]) 02/23/2022   Hypervolemia associated with renal insufficiency 01/29/2022   Dyslipidemia 01/29/2022   DNR (do not resuscitate) 01/29/2022   Polysubstance abuse ([place]) 01/06/2022   Acute on chronic combined systolic and diastolic CHF (congestive heart failure) ([place]) 01/06/2022   Abdominal  pain [id number]   Acute metabolic encephalopathy 123456   Malignant HTN with heart disease, w/o CHF, 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 12/24/2021   History of hepatitis C 12/08/2021   Hypertensive urgency 10/29/2021   Fluid overload 10/29/2021   Acute on chronic HFrEF (heart failure with reduced ejection fraction) ([place]) 10/29/2021   Chronic anticoagulation 10/29/2021   Prolonged QT interval 10/29/2021   Leukopenia 10/29/2021   SOB (shortness of breath) 09/15/2021   Hypertensive emergency 08/23/2021   Altered mental status    ESRD (end stage renal disease) on dialysis with hyperkalemia and metabolic acidosis with elevated AG  08/12/2021   Malignant hypertension 06/19/2021   Anemia 06/05/2021   Chronic diastolic congestive heart failure ([place]) 04/30/2021   Pure hypercholesterolemia 02/14/2021   Acute pulmonary edema ([place]) 12/23/2020   Acute respiratory failure with hypoxia ([place]) 09/21/2020   Acute renal failure superimposed on chronic kidney disease ([place]) 09/21/2020   Community acquired pneumonia 09/21/2020   Uremia [id number]   Metabolic acidosis with increased anion gap and accumulation of organic acids 09/21/2020   Hypertensive crisis 09/21/2020   Troponin level elevated  08/28/2019   Positive D dimer 08/28/2019   Hyperkalemia 08/28/2019   SVT (supraventricular tachycardia) 08/28/2019   SIRS (systemic inflammatory response syndrome) ([place]) 08/27/2019   Paroxysmal SVT (supraventricular tachycardia) 08/27/2019   Aortic atherosclerosis ([place]) 07/05/2019   Chest pain 07/05/2019   History of CVA (cerebrovascular accident) 05/29/2019   AF (paroxysmal atrial fibrillation) ([place]) 05/29/2019   ESRD on dialysis ([place]) 05/29/2019   Cerebral thrombosis with cerebral infarction 05/22/2019   Malnutrition of moderate degree 05/19/2019   Pleural effusion 05/18/2019   Atrial fibrillation with RVR ([place]) 05/18/2019   S/P aortic aneurysm repair 04/07/2019   Aortic dissection ([place]) 04/04/2019    Dissection of aorta ([place]) 04/03/2019   Non compliance with medical treatment 12/04/2017   Chronic obstructive lung disease ([place]) 01/16/2017   Chronic low back pain 06/22/2016   Insomnia 03/14/2015   S/P lumbar spinal fusion 01/18/2014   Tobacco use disorder 04/19/2009   GERD 09/16/2006    ONSET DATE: 07/09/22- referral date  REFERRING DIAG: R60.0 (ICD-10-CM) - Edema of right upper arm M25.511 (ICD-10-CM) - Right shoulder pain, unspecified chronicity G81.91 (ICD-10-CM) - Right hemiparesis ([place])   THERAPY DIAG:  Edema of right upper arm - Plan: Ot plan of care cert/re-cert  Right shoulder pain, unspecified chronicity - Plan: Ot plan of care cert/re-cert  Right hemiparesis ([place]) - Plan: Ot plan of care cert/re-cert  Localized edema - Plan: Ot plan of care cert/re-cert  Other lack of coordination - Plan: Ot plan of care cert/re-cert  Muscle weakness (generalized) - Plan: Ot plan of care cert/re-cert  Stiffness of right hand, not elsewhere classified - Plan: Ot plan of care cert/re-cert  Stiffness of right wrist, not elsewhere classified - Plan: Ot plan of care cert/re-cert  Stiffness of right shoulder, not elsewhere classified - Plan: Ot plan of care cert/re-cert  Pain in right arm - Plan: Ot plan of care cert/re-cert  Pain in right hand - Plan: Ot plan of care cert/re-cert  Rationale for Evaluation and Treatment: Rehabilitation  SUBJECTIVE:   SUBJECTIVE STATEMENT: Pt wants to use her hand better Pt accompanied by: family member, sister waited in waiting room  PERTINENT HISTORY: She suffered a stroke on 02/22/22 to her Lt posterior-frontal parietal and anterior-parietal areas causing dysphagia, right hemiplegia, speech issues, and cognitive problems. She spent ~2 weeks in the hospital, in inpatient rehab.PMH:HTN, HLD, tachycardia, A-fib, CKD, and chest pain (other than cardiac), hx of lumbar laminectomy, breast CA, ESRD on dialysis, DM, polysubstance abuse, HEP  C  PRECAUTIONS: dialysis port in chest , dialysis-Tues, Thurs Sat schedule  WEIGHT BEARING RESTRICTIONS: No  PAIN:  Are you having pain? Yes: NPRS scale: 10/10 Pain location: R arm, hand with use Pain description: aching  Aggravating factors: movement Relieving factors: rest  FALLS: Has patient fallen in last 6 months? No  LIVING ENVIRONMENT: Lives with: lives alone Lives in: House/apartment   PLOF: Independent  PATIENT GOALS: To use her RUE better  OBJECTIVE:   HAND DOMINANCE: Right  ADLs: Overall ADLs: mod I Transfers/ambulation related to ADLs: Eating: mod I using LUE Grooming: mod I UB Dressing: mod I LB Dressing: mod I Toileting: mod I Bathing: mod I Tub Shower transfers: mod I   IADLs: Shopping: needs assist for transportation Light housekeeping: sister helps with heavier cleaning Meal Prep: mod I with stovetop and microwave Community mobility: mod I  Medication management: Pt's sister loads box and pt takes after meds are loaded Financial management: needs assist fro transportation Handwriting: unable  MOBILITY STATUS: Independent  FUNCTIONAL OUTCOME MEASURES: Quick Dash: 81.8% disability, higher disability=greater impairment   UPPER EXTREMITY ROM:  LUE A/ROM WFLS, RUE composite flexion/ extension : 25%  Active ROM Right eval Left eval  Shoulder flexion 45   Shoulder abduction    Shoulder adduction    Shoulder extension    Shoulder internal rotation    Shoulder external rotation    Elbow flexion 100   Elbow extension -55   Wrist flexion 45   Wrist extension 20   Wrist ulnar deviation    Wrist radial deviation    Wrist pronation WFLS   Wrist supination 60%   (Blank rows = not tested)     HAND FUNCTION: Grip strength: Right: unable   Left: NT   COORDINATION: Box and Blocks:  Right unable  Left NT  SENSATION: Appears intact  EDEMA: moderate in R hand    COGNITION: Overall cognitive status:  to be further assessed in  a functional context, pt has aphasia  VISION: Subjective report: deneis visual changes   VISION ASSESSMENT: Not tested      OBSERVATIONS: Pt has remaining expressive aphasia from CVA   TODAY'S TREATMENT:                                                                                                                              DATE:  08/10/22- beginning HEP  PATIENT EDUCATION: Education details:role of OT, inial HEP- see pt instructions Person educated: Patient Education method: Explanation, demonstration, handout Education comprehension: verbalized understanding, returned demonstration, verbal cues required, tactile cues required, and needs further education  HOME EXERCISE PROGRAM: 08/10/22 beginning A/ROM HEP   GOALS: Goals reviewed with patient? No  SHORT TERM GOALS: Target date: 10/09/22  I with inial HEP Baseline:dependent Goal status: INITIAL  2.  Pt will demonstrate at least 60%  RUE composite finger flexion in prep for functional use. Baseline: 25% Goal status: INITIAL  3.  Pt will demonstrate 55* shoulder flexion in prep for functional reach. Baseline: 45* Goal status: INITIAL  4.  Pt will verbalize understanding of edema and pain reduction techniques control techniques Baseline: Pt needs reinforcement and additional education Goal status: INITIAL  5.  Pt will use RUE as a stabilizer/ gross assist at least 25% of the time for ADLS/ IADLs. Baseline: uses less than 10% Goal status: INITIAL  6.  Pt will demonstrate at least -45 R elbow extension in prep for functional reach. Baseline: -55 Goal status: INITIAL  LONG TERM GOALS: Target date: 11/01/21  I with updated HEP Baseline: dependent Goal status: INITIAL  2.  Pt will improve Quick Dash score to 76%% or better. Baseline: 81.8% Goal status: INITIAL  3.  Pt will demonstrate ability to grasp/ release a 3 inch cone with RUE. Baseline: unable Goal status: INITIAL  4.  Pt will use RUE as a  stabilizer/ gross assist at least 40% of the time for ADLs. Baseline:uses less than 10%  Goal status: INITIAL  5.  Pt will demonstrate ability to touch a target with 60* RUE shoulder flexion in prep for functional reach. Baseline: 45 shoulder flexion, Goal status: INITIAL  6.  Pt will report RUE pain no greater than 6/10 for light functional use. Baseline: 10/10 with use Goal status: INITIAL  ASSESSMENT:  CLINICAL IMPRESSION: Patient is a 72 y.o. female who was seen today for occupational therapy evaluation for CVA. She suffered a stroke on 02/22/22 to her Lt posterior-frontal parietal and anterior-parietal areas causing dysphagia, right hemiplegia, speech issues. She spent ~2 weeks in the hospital, in inpatient rehab.Pt previously received OT at this site and was d/c due to medical issues and lack of progress.PMH:HTN, HLD, tachycardia, A-fib, CKD, and chest pain (other than cardiac), hx of lumbar laminectomy, breast CA, ESRD on dialysis, DM    PERFORMANCE DEFICITS: in functional skills including ADLs, IADLs, coordination, dexterity, tone, ROM, strength, pain, flexibility, Fine motor control, Gross motor control, endurance, decreased knowledge of precautions, decreased knowledge of use of DME, and UE functional use, cognitive skills including attention, problem solving, safety awareness, thought, and understand, and psychosocial skills including coping strategies, environmental adaptation, habits, interpersonal interactions, and routines and behaviors.   IMPAIRMENTS: are limiting patient from ADLs, IADLs, rest and sleep, play, leisure, and social participation.   CO-MORBIDITIES: may have co-morbidities  that affects occupational performance. Patient will benefit from skilled OT to address above impairments and improve overall function.  MODIFICATION OR ASSISTANCE TO COMPLETE EVALUATION: No modification of tasks or assist necessary to complete an evaluation.  OT OCCUPATIONAL PROFILE AND  HISTORY: Detailed assessment: Review of records and additional review of physical, cognitive, psychosocial history related to current functional performance.  CLINICAL DECISION MAKING: LOW - limited treatment options, no task modification necessary  REHAB POTENTIAL: Good  EVALUATION COMPLEXITY: Low    PLAN:  OT FREQUENCY: 2x/week  OT DURATION: 12 weeks plus eval- anticipate d/c following 6-8 weeks dependent on progress.  PLANNED INTERVENTIONS: self care/ADL training, therapeutic exercise, therapeutic activity, neuromuscular re-education, manual therapy, passive range of motion, splinting, paraffin, fluidotherapy, moist heat, cryotherapy, contrast bath, patient/family education, cognitive remediation/compensation, energy conservation, coping strategies training, DME and/or AE instructions, and Re-evaluation  RECOMMENDED OTHER SERVICES: n/a  CONSULTED AND AGREED WITH PLAN OF CARE: Patient  PLAN FOR NEXT SESSION: gentle ROM/ NMR, retrograde massage prn   Kynzli Rease, OT 08/10/2022, 4:08 PM

## 2022-08-11 ENCOUNTER — Emergency Department (HOSPITAL_COMMUNITY): Payer: 59

## 2022-08-11 ENCOUNTER — Encounter (HOSPITAL_COMMUNITY): Payer: Self-pay

## 2022-08-11 ENCOUNTER — Other Ambulatory Visit: Payer: Self-pay

## 2022-08-11 ENCOUNTER — Inpatient Hospital Stay (HOSPITAL_COMMUNITY)
Admission: EM | Admit: 2022-08-11 | Discharge: 2022-08-14 | DRG: 291 | Disposition: A | Discharge status: Home / Self Care | Payer: 59 | Attending: Student | Admitting: Student

## 2022-08-11 DIAGNOSIS — I2489 Other forms of acute ischemic heart disease: Secondary | ICD-10-CM | POA: Diagnosis present

## 2022-08-11 DIAGNOSIS — D259 Leiomyoma of uterus, unspecified: Secondary | ICD-10-CM | POA: Diagnosis present

## 2022-08-11 DIAGNOSIS — Z992 Dependence on renal dialysis: Secondary | ICD-10-CM | POA: Diagnosis not present

## 2022-08-11 DIAGNOSIS — I7100 Dissection of unspecified site of aorta: Secondary | ICD-10-CM | POA: Diagnosis present

## 2022-08-11 DIAGNOSIS — Z1152 Encounter for screening for COVID-19: Secondary | ICD-10-CM | POA: Diagnosis not present

## 2022-08-11 DIAGNOSIS — Z8673 Personal history of transient ischemic attack (TIA), and cerebral infarction without residual deficits: Secondary | ICD-10-CM

## 2022-08-11 DIAGNOSIS — E8889 Other specified metabolic disorders: Secondary | ICD-10-CM | POA: Diagnosis present

## 2022-08-11 DIAGNOSIS — Z8249 Family history of ischemic heart disease and other diseases of the circulatory system: Secondary | ICD-10-CM

## 2022-08-11 DIAGNOSIS — Z9889 Other specified postprocedural states: Secondary | ICD-10-CM | POA: Diagnosis not present

## 2022-08-11 DIAGNOSIS — Z91013 Allergy to seafood: Secondary | ICD-10-CM

## 2022-08-11 DIAGNOSIS — Z87442 Personal history of urinary calculi: Secondary | ICD-10-CM | POA: Diagnosis not present

## 2022-08-11 DIAGNOSIS — N170 Acute kidney failure with tubular necrosis: Secondary | ICD-10-CM | POA: Diagnosis present

## 2022-08-11 DIAGNOSIS — J449 Chronic obstructive pulmonary disease, unspecified: Secondary | ICD-10-CM | POA: Diagnosis present

## 2022-08-11 DIAGNOSIS — D631 Anemia in chronic kidney disease: Secondary | ICD-10-CM | POA: Diagnosis present

## 2022-08-11 DIAGNOSIS — Z881 Allergy status to other antibiotic agents status: Secondary | ICD-10-CM

## 2022-08-11 DIAGNOSIS — I5022 Chronic systolic (congestive) heart failure: Secondary | ICD-10-CM | POA: Insufficient documentation

## 2022-08-11 DIAGNOSIS — F17200 Nicotine dependence, unspecified, uncomplicated: Secondary | ICD-10-CM | POA: Diagnosis not present

## 2022-08-11 DIAGNOSIS — I132 Hypertensive heart and chronic kidney disease with heart failure and with stage 5 chronic kidney disease, or end stage renal disease: Principal | ICD-10-CM | POA: Diagnosis present

## 2022-08-11 DIAGNOSIS — I69351 Hemiplegia and hemiparesis following cerebral infarction affecting right dominant side: Secondary | ICD-10-CM | POA: Diagnosis not present

## 2022-08-11 DIAGNOSIS — Z883 Allergy status to other anti-infective agents status: Secondary | ICD-10-CM

## 2022-08-11 DIAGNOSIS — R451 Restlessness and agitation: Secondary | ICD-10-CM | POA: Diagnosis not present

## 2022-08-11 DIAGNOSIS — Z853 Personal history of malignant neoplasm of breast: Secondary | ICD-10-CM | POA: Diagnosis not present

## 2022-08-11 DIAGNOSIS — K219 Gastro-esophageal reflux disease without esophagitis: Secondary | ICD-10-CM | POA: Diagnosis present

## 2022-08-11 DIAGNOSIS — M7989 Other specified soft tissue disorders: Secondary | ICD-10-CM | POA: Diagnosis not present

## 2022-08-11 DIAGNOSIS — I2510 Atherosclerotic heart disease of native coronary artery without angina pectoris: Secondary | ICD-10-CM | POA: Diagnosis present

## 2022-08-11 DIAGNOSIS — J810 Acute pulmonary edema: Secondary | ICD-10-CM | POA: Diagnosis not present

## 2022-08-11 DIAGNOSIS — Z888 Allergy status to other drugs, medicaments and biological substances status: Secondary | ICD-10-CM

## 2022-08-11 DIAGNOSIS — N189 Chronic kidney disease, unspecified: Secondary | ICD-10-CM | POA: Diagnosis not present

## 2022-08-11 DIAGNOSIS — N2581 Secondary hyperparathyroidism of renal origin: Secondary | ICD-10-CM | POA: Diagnosis present

## 2022-08-11 DIAGNOSIS — Z8619 Personal history of other infectious and parasitic diseases: Secondary | ICD-10-CM | POA: Diagnosis not present

## 2022-08-11 DIAGNOSIS — Z8679 Personal history of other diseases of the circulatory system: Secondary | ICD-10-CM

## 2022-08-11 DIAGNOSIS — I161 Hypertensive emergency: Secondary | ICD-10-CM | POA: Diagnosis present

## 2022-08-11 DIAGNOSIS — Z885 Allergy status to narcotic agent status: Secondary | ICD-10-CM

## 2022-08-11 DIAGNOSIS — N186 End stage renal disease: Secondary | ICD-10-CM | POA: Diagnosis not present

## 2022-08-11 DIAGNOSIS — E875 Hyperkalemia: Secondary | ICD-10-CM | POA: Diagnosis present

## 2022-08-11 DIAGNOSIS — I480 Paroxysmal atrial fibrillation: Secondary | ICD-10-CM | POA: Diagnosis not present

## 2022-08-11 DIAGNOSIS — Z981 Arthrodesis status: Secondary | ICD-10-CM | POA: Diagnosis not present

## 2022-08-11 DIAGNOSIS — Z886 Allergy status to analgesic agent status: Secondary | ICD-10-CM

## 2022-08-11 DIAGNOSIS — G4700 Insomnia, unspecified: Secondary | ICD-10-CM | POA: Diagnosis present

## 2022-08-11 DIAGNOSIS — M1990 Unspecified osteoarthritis, unspecified site: Secondary | ICD-10-CM | POA: Diagnosis present

## 2022-08-11 DIAGNOSIS — Z87891 Personal history of nicotine dependence: Secondary | ICD-10-CM | POA: Diagnosis not present

## 2022-08-11 DIAGNOSIS — J9601 Acute respiratory failure with hypoxia: Secondary | ICD-10-CM | POA: Diagnosis not present

## 2022-08-11 DIAGNOSIS — Z79899 Other long term (current) drug therapy: Secondary | ICD-10-CM

## 2022-08-11 DIAGNOSIS — I1A0 Resistant hypertension: Secondary | ICD-10-CM | POA: Diagnosis present

## 2022-08-11 DIAGNOSIS — G8929 Other chronic pain: Secondary | ICD-10-CM | POA: Diagnosis present

## 2022-08-11 DIAGNOSIS — Z7901 Long term (current) use of anticoagulants: Secondary | ICD-10-CM

## 2022-08-11 DIAGNOSIS — I5023 Acute on chronic systolic (congestive) heart failure: Secondary | ICD-10-CM | POA: Diagnosis present

## 2022-08-11 DIAGNOSIS — Z862 Personal history of diseases of the blood and blood-forming organs and certain disorders involving the immune mechanism: Secondary | ICD-10-CM | POA: Diagnosis present

## 2022-08-11 DIAGNOSIS — R0603 Acute respiratory distress: Secondary | ICD-10-CM | POA: Diagnosis present

## 2022-08-11 DIAGNOSIS — I509 Heart failure, unspecified: Secondary | ICD-10-CM

## 2022-08-11 DIAGNOSIS — R0602 Shortness of breath: Secondary | ICD-10-CM | POA: Diagnosis present

## 2022-08-11 LAB — RESP PANEL BY RT-PCR (RSV, FLU A&B, COVID)  RVPGX2
Influenza A by PCR: NEGATIVE
Influenza B by PCR: NEGATIVE
Resp Syncytial Virus by PCR: NEGATIVE
SARS Coronavirus 2 by RT PCR: NEGATIVE

## 2022-08-11 LAB — COMPREHENSIVE METABOLIC PANEL
ALT: 28 U/L (ref 0–44)
AST: 29 U/L (ref 15–41)
Albumin: 4.3 g/dL (ref 3.5–5.0)
Alkaline Phosphatase: 97 U/L (ref 38–126)
Anion gap: 16 — ABNORMAL HIGH (ref 5–15)
BUN: 59 mg/dL — ABNORMAL HIGH (ref 8–23)
CO2: 24 mmol/L (ref 22–32)
Calcium: 9.7 mg/dL (ref 8.9–10.3)
Chloride: 102 mmol/L (ref 98–111)
Creatinine, Ser: 9.97 mg/dL — ABNORMAL HIGH (ref 0.44–1.00)
GFR, Estimated: 4 mL/min — ABNORMAL LOW (ref >60)
Glucose, Bld: 121 mg/dL — ABNORMAL HIGH (ref 70–99)
Potassium: 5 mmol/L (ref 3.5–5.1)
Sodium: 142 mmol/L (ref 135–145)
Total Bilirubin: 0.8 mg/dL (ref 0.3–1.2)
Total Protein: 7.9 g/dL (ref 6.5–8.1)

## 2022-08-11 LAB — CBG MONITORING, ED
Glucose-Capillary: 116 mg/dL — ABNORMAL HIGH (ref 70–99)
Glucose-Capillary: 114 mg/dL — ABNORMAL HIGH (ref 70–99)

## 2022-08-11 LAB — I-STAT VENOUS BLOOD GAS, ED
Acid-Base Excess: 4 mmol/L — ABNORMAL HIGH (ref 0.0–2.0)
Bicarbonate: 24.6 mmol/L (ref 20.0–28.0)
Calcium, Ion: 0.85 mmol/L — CL (ref 1.15–1.40)
HCT: 39 % (ref 36.0–46.0)
Hemoglobin: 13.3 g/dL (ref 12.0–15.0)
O2 Saturation: 99 %
Potassium: 6.9 mmol/L (ref 3.5–5.1)
Sodium: 136 mmol/L (ref 135–145)
TCO2: 25 mmol/L (ref 22–32)
pCO2, Ven: 26.1 mmHg — ABNORMAL LOW (ref 44–60)
pH, Ven: 7.582 — ABNORMAL HIGH (ref 7.25–7.43)
pO2, Ven: 108 mmHg — ABNORMAL HIGH (ref 32–45)

## 2022-08-11 LAB — HEPATITIS B SURFACE ANTIGEN: Hepatitis B Surface Ag: NONREACTIVE

## 2022-08-11 LAB — GLUCOSE, CAPILLARY
Glucose-Capillary: 70 mg/dL (ref 70–99)
Glucose-Capillary: 109 mg/dL — ABNORMAL HIGH (ref 70–99)
Glucose-Capillary: 104 mg/dL — ABNORMAL HIGH (ref 70–99)
Glucose-Capillary: 90 mg/dL (ref 70–99)

## 2022-08-11 LAB — GLUCOSE, RANDOM: Glucose, Bld: 133 mg/dL — ABNORMAL HIGH (ref 70–99)

## 2022-08-11 LAB — TROPONIN I (HIGH SENSITIVITY)
Troponin I (High Sensitivity): 57 ng/L — ABNORMAL HIGH (ref <18)
Troponin I (High Sensitivity): 54 ng/L — ABNORMAL HIGH (ref <18)

## 2022-08-11 LAB — MAGNESIUM: Magnesium: 2.3 mg/dL (ref 1.7–2.4)

## 2022-08-11 LAB — BRAIN NATRIURETIC PEPTIDE: B Natriuretic Peptide: >4500 pg/mL — ABNORMAL HIGH (ref 0.0–100.0)

## 2022-08-11 MED ORDER — LIDOCAINE-PRILOCAINE 2.5-2.5 % EX CREA: 1.0000 | TOPICAL_CREAM | CUTANEOUS | Status: DC | PRN

## 2022-08-11 MED ORDER — LIDOCAINE HCL (PF) 1 % IJ SOLN: 5.0000 mL | INTRAMUSCULAR | Status: DC | PRN

## 2022-08-11 MED ORDER — HYDRALAZINE HCL 50 MG PO TABS
50.0000 mg | ORAL_TABLET | Freq: Two times a day (BID) | ORAL | Status: DC
  Administered 2022-08-11 – 2022-08-12 (×4): 50 mg via ORAL
  Filled 2022-08-11 (×5): qty 1

## 2022-08-11 MED ORDER — PANTOPRAZOLE SODIUM 40 MG PO TBEC
40.0000 mg | DELAYED_RELEASE_TABLET | Freq: Two times a day (BID) | ORAL | Status: DC
  Administered 2022-08-11 – 2022-08-14 (×6): 40 mg via ORAL
  Filled 2022-08-11 (×6): qty 1

## 2022-08-11 MED ORDER — DIPHENHYDRAMINE HCL 25 MG PO CAPS: 25.0000 mg | ORAL_CAPSULE | Freq: Four times a day (QID) | ORAL | Status: DC | PRN

## 2022-08-11 MED ORDER — DEXTROSE 50 % IV SOLN
1.0000 | Freq: Once | INTRAVENOUS | Status: AC
  Administered 2022-08-11: 50 mL via INTRAVENOUS
  Filled 2022-08-11: qty 50

## 2022-08-11 MED ORDER — SODIUM BICARBONATE 8.4 % IV SOLN
50.0000 meq | Freq: Once | INTRAVENOUS | Status: AC
  Administered 2022-08-11: 50 meq via INTRAVENOUS
  Filled 2022-08-11: qty 50

## 2022-08-11 MED ORDER — ALBUTEROL SULFATE (2.5 MG/3ML) 0.083% IN NEBU
10.0000 mg | INHALATION_SOLUTION | Freq: Once | RESPIRATORY_TRACT | Status: AC
  Administered 2022-08-11: 10 mg via RESPIRATORY_TRACT
  Filled 2022-08-11: qty 12

## 2022-08-11 MED ORDER — SEVELAMER CARBONATE 2.4 G PO PACK
2.4000 g | PACK | Freq: Three times a day (TID) | ORAL | Status: DC
  Administered 2022-08-12 – 2022-08-14 (×6): 2.4 g via ORAL
  Filled 2022-08-11 (×9): qty 1

## 2022-08-11 MED ORDER — ANTICOAGULANT SODIUM CITRATE 4% (200MG/5ML) IV SOLN
5.0000 mL | Status: DC | PRN
  Filled 2022-08-11: qty 5

## 2022-08-11 MED ORDER — HEPARIN SODIUM (PORCINE) 1000 UNIT/ML DIALYSIS: 1000.0000 [IU] | INTRAMUSCULAR | Status: DC | PRN

## 2022-08-11 MED ORDER — DOXERCALCIFEROL 4 MCG/2ML IV SOLN
5.0000 ug | INTRAVENOUS | Status: DC
  Administered 2022-08-13: 5 ug via INTRAVENOUS
  Filled 2022-08-11 (×2): qty 4

## 2022-08-11 MED ORDER — DOCUSATE SODIUM 100 MG PO CAPS: 100.0000 mg | ORAL_CAPSULE | Freq: Two times a day (BID) | ORAL | Status: DC | PRN

## 2022-08-11 MED ORDER — ONDANSETRON HCL 4 MG/2ML IJ SOLN
4.0000 mg | Freq: Three times a day (TID) | INTRAMUSCULAR | Status: DC | PRN
  Administered 2022-08-11: 4 mg via INTRAVENOUS

## 2022-08-11 MED ORDER — AMLODIPINE BESYLATE 10 MG PO TABS
10.0000 mg | ORAL_TABLET | Freq: Every day | ORAL | Status: DC
  Administered 2022-08-11 – 2022-08-14 (×4): 10 mg via ORAL
  Filled 2022-08-11 (×4): qty 1

## 2022-08-11 MED ORDER — HEPARIN SODIUM (PORCINE) 1000 UNIT/ML IJ SOLN
INTRAMUSCULAR | Status: AC
  Filled 2022-08-11: qty 4

## 2022-08-11 MED ORDER — ALTEPLASE 2 MG IJ SOLR: 2.0000 mg | Freq: Once | INTRAMUSCULAR | Status: DC | PRN

## 2022-08-11 MED ORDER — DIPHENHYDRAMINE HCL 50 MG/ML IJ SOLN: 12.5000 mg | Freq: Once | INTRAMUSCULAR | Status: DC

## 2022-08-11 MED ORDER — CLONIDINE HCL 0.1 MG/24HR TD PTWK
0.1000 mg | MEDICATED_PATCH | TRANSDERMAL | Status: DC
  Administered 2022-08-11: 0.1 mg via TRANSDERMAL
  Filled 2022-08-11 (×2): qty 1

## 2022-08-11 MED ORDER — POLYETHYLENE GLYCOL 3350 17 G PO PACK: 17.0000 g | PACK | Freq: Every day | ORAL | Status: DC | PRN

## 2022-08-11 MED ORDER — NITROGLYCERIN IN D5W 200-5 MCG/ML-% IV SOLN
0.0000 ug/min | INTRAVENOUS | Status: DC
  Administered 2022-08-11: 5 ug/min via INTRAVENOUS
  Filled 2022-08-11: qty 250

## 2022-08-11 MED ORDER — APIXABAN 2.5 MG PO TABS
2.5000 mg | ORAL_TABLET | Freq: Two times a day (BID) | ORAL | Status: DC
  Administered 2022-08-11 – 2022-08-14 (×6): 2.5 mg via ORAL
  Filled 2022-08-11 (×6): qty 1

## 2022-08-11 MED ORDER — CLONIDINE HCL 0.1 MG/24HR TD PTWK: 0.1000 mg | MEDICATED_PATCH | TRANSDERMAL | Status: DC

## 2022-08-11 MED ORDER — INSULIN ASPART 100 UNIT/ML IV SOLN
5.0000 [IU] | Freq: Once | INTRAVENOUS | Status: AC
  Administered 2022-08-11: 5 [IU] via INTRAVENOUS

## 2022-08-11 MED ORDER — CALCIUM GLUCONATE-NACL 1-0.675 GM/50ML-% IV SOLN
1.0000 g | Freq: Once | INTRAVENOUS | Status: AC
  Administered 2022-08-11: 1000 mg via INTRAVENOUS
  Filled 2022-08-11: qty 50

## 2022-08-11 MED ORDER — DEXMEDETOMIDINE HCL IN NACL 400 MCG/100ML IV SOLN
0.0000 ug/kg/h | INTRAVENOUS | Status: DC
  Administered 2022-08-11: 0.4 ug/kg/h via INTRAVENOUS
  Filled 2022-08-11: qty 100

## 2022-08-11 MED ORDER — TRAZODONE HCL 50 MG PO TABS: 25.0000 mg | ORAL_TABLET | Freq: Every evening | ORAL | Status: DC | PRN

## 2022-08-11 MED ORDER — ISOSORBIDE MONONITRATE ER 30 MG PO TB24
30.0000 mg | ORAL_TABLET | Freq: Every day | ORAL | Status: DC
  Administered 2022-08-11 – 2022-08-14 (×4): 30 mg via ORAL
  Filled 2022-08-11 (×4): qty 1

## 2022-08-11 MED ORDER — ORAL CARE MOUTH RINSE: 15.0000 mL | OROMUCOSAL | Status: DC | PRN

## 2022-08-11 MED ORDER — NITROGLYCERIN IN D5W 200-5 MCG/ML-% IV SOLN
0.0000 ug/min | INTRAVENOUS | Status: DC
  Administered 2022-08-11: 100 ug/min via INTRAVENOUS

## 2022-08-11 MED ORDER — PENTAFLUOROPROP-TETRAFLUOROETH EX AERO: 1.0000 | INHALATION_SPRAY | CUTANEOUS | Status: DC | PRN

## 2022-08-11 MED ORDER — MIDAZOLAM HCL 2 MG/2ML IJ SOLN
INTRAMUSCULAR | Status: AC
  Administered 2022-08-11: 2 mg via INTRAMUSCULAR
  Filled 2022-08-11: qty 2

## 2022-08-11 MED ORDER — ONDANSETRON HCL 4 MG/2ML IJ SOLN
INTRAMUSCULAR | Status: AC
  Filled 2022-08-11: qty 2

## 2022-08-11 MED ORDER — CARVEDILOL 25 MG PO TABS
25.0000 mg | ORAL_TABLET | Freq: Two times a day (BID) | ORAL | Status: DC
  Administered 2022-08-11 – 2022-08-14 (×5): 25 mg via ORAL
  Filled 2022-08-11 (×5): qty 1

## 2022-08-11 NOTE — Progress Notes (Signed)
Attempted to notify the patient's son that the patient was admitted here to the ICU. The phone rang but there was no voicemail set up.

## 2022-08-11 NOTE — Progress Notes (Signed)
[facility] Progress Note Patient Name: Monique Henderson DOB: June 15, 1950 MRN: [id number]   Date of Service  08/11/2022  HPI/Events of Note  Patient extubated earlier today, fully alert, interactive , and requesting a diet.  eICU Interventions  Renal / Carb modified diet ordered.        Frederik Pear 08/11/2022, 8:37 PM

## 2022-08-11 NOTE — ED Provider Notes (Signed)
[facility] Provider Note   CSN: [id number] Arrival date & time: 08/11/22  [id number]     History  Chief Complaint  Patient presents with   Respiratory Distress    Monique Henderson is a 72 y.o. female.  72 year old female with a history of ESRD on Tuesday Thursday Saturday IHD, atrial fibrillation on Eliquis, and CHF who presents emergency department with shortness of breath.  Patient reports that last night she started experiencing acute onset shortness of breath.  Also reports substernal chest pain that she has difficulty characterizing.  Denies feeling sick before hand.  No sick contacts.  When EMS arrived she was hypoxic and hypertensive and was placed on nonrebreather.  Was given sublingual nitroglycerin with some improvement.  Has been agitated as well.  Does have history of cocaine abuse.       Home Medications Prior to Admission medications   Medication Sig Start Date End Date Taking? Authorizing Provider  albuterol (VENTOLIN HFA) 108 (90 Base) MCG/ACT inhaler Inhale 2 puffs into the lungs every 6 (six) hours as needed for wheezing or shortness of breath. 07/30/20   Maximiano Coss, NP  amLODipine (NORVASC) 10 MG tablet Take 1 tablet (10 mg total) by mouth daily. 03/06/22   Setzer, Edman Circle, PA-C  apixaban (ELIQUIS) 2.5 MG TABS tablet Take 1 tablet (2.5 mg total) by mouth 2 (two) times daily. 03/06/22   Setzer, Edman Circle, PA-C  carvedilol (COREG) 25 MG tablet Take 1 tablet (25 mg total) by mouth 2 (two) times daily with a meal. 05/01/22 05/02/23  Ghimire, Henreitta Leber, MD  Cholecalciferol (VITAMIN D-3 PO) Take 1 capsule by mouth daily.    [provider]  cloNIDine (CATAPRES - DOSED IN MG/24 HR) 0.1 mg/24hr patch Place 1 patch (0.1 mg total) onto the skin every Saturday. 03/07/22   Setzer, Edman Circle, PA-C  diphenhydrAMINE-zinc acetate (BENADRYL) cream Apply topically 3 (three) times daily as needed for itching. 03/06/22   Setzer, Edman Circle,  PA-C  hydrALAZINE (APRESOLINE) 25 MG tablet Take 2 tablets (50 mg total) by mouth 2 (two) times daily. 05/01/22   Ghimire, Henreitta Leber, MD  isosorbide mononitrate (IMDUR) 30 MG 24 hr tablet Take 1 tablet (30 mg total) by mouth daily. 04/20/22   Dorna Mai, MD  lidocaine (LIDODERM) 5 % Place 1 patch onto the skin daily. Remove & Discard patch within 12 hours or as directed by MD 03/07/22   Vaughan Basta, Edman Circle, PA-C  naloxone [facility]) nasal spray 4 mg/0.1 mL 1 spray as directed. 02/19/22   [provider]  oxyCODONE-acetaminophen (PERCOCET) 10-325 MG tablet Take 1 tablet by mouth 3 (three) times daily as needed. 03/12/22   [provider]  pantoprazole (PROTONIX) 40 MG tablet Take 1 tablet (40 mg total) by mouth 2 (two) times daily. 05/01/22   Ghimire, Henreitta Leber, MD  rosuvastatin (CRESTOR) 10 MG tablet TAKE 1 TABLET (10 MG TOTAL) BY MOUTH DAILY FOR CHOLESTEROL 08/07/22   Dorna Mai, MD  sevelamer carbonate (RENVELA) 2.4 g PACK Take 2.4 g by mouth 3 (three) times daily with meals. 03/06/22   Setzer, Edman Circle, PA-C  traZODone (DESYREL) 50 MG tablet Take 0.5 tablets (25 mg total) by mouth at bedtime as needed for sleep. 04/20/22   Dorna Mai, MD      Allergies    Shrimp [shellfish allergy], Bactroban [mupirocin], Hydrocodone, Chlorhexidine gluconate, Tylenol [acetaminophen], and Zestril [lisinopril]    Review of Systems   Review of Systems  Physical Exam Updated Vital Signs Ht '5\' 3"'$  (1.6 m)   Wt 48.1 kg   BMI 18.78 kg/m  Physical Exam Vitals and nursing note reviewed.  Constitutional:      General: She is in acute distress.     Appearance: She is well-developed. She is ill-appearing.     Comments: Agitated writhing in stretcher  HENT:     Head: Normocephalic and atraumatic.     Right Ear: External ear normal.     Left Ear: External ear normal.     Nose: Nose normal.  Eyes:     Extraocular Movements: Extraocular movements intact.     Conjunctiva/sclera: Conjunctivae  normal.     Pupils: Pupils are equal, round, and reactive to light.  Cardiovascular:     Rate and Rhythm: Regular rhythm. Tachycardia present.  Pulmonary:     Effort: Respiratory distress present.     Breath sounds: Rales (Bibasilar.  Diminished lung sounds in left hemifield) present.  Musculoskeletal:     Cervical back: Normal range of motion and neck supple.     Right lower leg: Edema (1+) present.     Left lower leg: Edema (1+) present.  Skin:    General: Skin is warm and dry.  Neurological:     Mental Status: She is alert. Mental status is at baseline.     Comments: Moving all 4 extremities  Psychiatric:        Mood and Affect: Mood normal.     ED Results / Procedures / Treatments   Labs (all labs ordered are listed, but only abnormal results are displayed) Labs Reviewed  I-STAT VENOUS BLOOD GAS, ED - Abnormal; Notable for the following components:      Result Value   pH, Ven 7.582 (*)    pCO2, Ven 26.1 (*)    pO2, Ven 108 (*)    Acid-Base Excess 4.0 (*)    Potassium 6.9 (*)    Calcium, Ion 0.85 (*)    All other components within normal limits  CBG MONITORING, ED - Abnormal; Notable for the following components:   Glucose-Capillary 116 (*)    All other components within normal limits  RESP PANEL BY RT-PCR (RSV, FLU A&B, COVID)  [id number]  COMPREHENSIVE METABOLIC PANEL  MAGNESIUM  BRAIN NATRIURETIC PEPTIDE  GLUCOSE, RANDOM  CBG MONITORING, ED  TROPONIN I (HIGH SENSITIVITY)    EKG EKG Interpretation  Date/Time:  Tuesday August 11 2022 09:10:53 EDT Ventricular Rate:  103 PR Interval:  165 QRS Duration: 141 QT Interval:  390 QTC Calculation: 511 R Axis:   69 Text Interpretation: Interpretation limited secondary to artifact Sinus tachycardia Consider right atrial enlargement Right bundle branch block Inferior infarct, acute Confirmed by Margaretmary Eddy [phone]) on 08/11/2022 9:29:31 AM  Radiology No results found.  Procedures Procedures      EMERGENCY  DEPARTMENT [place] CARDIAC EXAM "Study: Limited Ultrasound of the Heart and Pericardium"  INDICATIONS:Chest pain and Dyspnea Multiple views of the heart and pericardium were obtained in real-time with a multi-frequency probe.  PERFORMED [id number] IMAGES ARCHIVED?: No LIMITATIONS:  None VIEWS USED: Parasternal long axis and Parasternal short axis INTERPRETATION: Cardiac activity present, Pericardial effusioin absent, and Decreased contractility    EMERGENCY DEPARTMENT [place] LUNG EXAM "Study: Limited Ultrasound of the Lung and Thorax"  INDICATIONS: Dyspnea Multiple views of both lungs using sagittal orientation were obtained.  PERFORMED BY: Myself IMAGES ARCHIVED?: No LIMITATIONS: Respiratory distress VIEWS USED: Anterior lung fields INTERPRETATION: No pneumothorax, Pulmonary edema, B Lines  Ultrasound Guided Peripheral IV Indication: difficult to access - nursing staff unable to secure adequate peripheral IV access Location: L Antecubital Catheter Size: 20 G  Static views used to identify the target vein then usual prep with Chloraprep. IV placed on first attempt under dynamic US guidance. Dark red flash noted, and catheter advanced smoothly into the vein. NS saline flushed without resistance, witnessed swelling, or patient discomfort. IV secured with I-site and tegaderm. The patient tolerated the procedure well. Performed By: Margaretmary Eddy MD   Medications Ordered in ED Medications  nitroGLYCERIN 50 mg in dextrose 5 % 250 mL (0.2 mg/mL) infusion (100 mcg/min Intravenous New Bag/Given 08/11/22 0934)  dexmedetomidine (PRECEDEX) 400 MCG/100ML (4 mcg/mL) infusion (0.4 mcg/kg/hr  48.1 kg Intravenous New Bag/Given 08/11/22 0936)  calcium gluconate 1 g/ 50 mL sodium chloride IVPB (has no administration in time range)  albuterol (PROVENTIL) (2.5 MG/3ML) 0.083% nebulizer solution 10 mg (has no administration in time range)  insulin aspart (novoLOG) injection 5 Units (has no administration  in time range)    And  dextrose 50 % solution 50 mL (has no administration in time range)  midazolam (VERSED) 2 MG/2ML injection (2 mg Intramuscular Given 08/11/22 0914)    ED Course/ Medical Decision Making/ A&P Clinical Course as of 08/11/22 0943  Tue Aug 11, 2022  0941 Dr Royce Macadamia from nephrology aware and will arrange for [facility] emergently.  [RP]    Clinical Course User Index [RP] Fransico Meadow, MD                             Medical Decision Making Amount and/or Complexity of Data Reviewed Labs: ordered. Radiology: ordered.  Risk OTC drugs. Prescription drug management. Decision regarding hospitalization.   PATTON WERBER is a 72 y.o. female with comorbidities that complicate the patient evaluation including ESRD on Tuesday Thursday Saturday IHD, atrial fibrillation on Eliquis, CHF, and cocaine abuse who presents emergency department with shortness of breath.   Initial Ddx:  Flash pulmonary edema, heart failure exacerbation, MI, pneumonia, URI, PE  MDM:  Feel the patient likely has flash pulmonary edema in the setting of her severe hypertension and ESRD.  Does also appear to have heart failure and has decreased contractility on ultrasound today.  Will start her on a nitroglycerin drip at this time.  Is agitated so was given intramuscular Versed but will also order her for Precedex which may help with her agitation blood pressure as well.  Considered PE but feel less likely given her clinical picture and the fact she is on Eliquis at home.  Plan:  Labs I-STAT BNP Troponin COVID/flu Chest x-ray  ED Summary/Re-evaluation:  Chest x-ray returned showing pulmonary edema.  BNP was too high to be calculated.  Patient was feeling much better and had improved blood pressure on the Precedex and nitroglycerin drip.  She tolerated BiPAP without difficulty.  Arranged for emergent dialysis that will need be be performed in the ICU.  Dr. Erin Fulling has accepted the patient for  admission.  Of note, i-STAT potassium was 6.9 and patient was shifted with dextrose, albuterol, and also given calcium.  However her CMP showed that her potassium was only 5.  This patient presents to the ED for concern of complaints listed in HPI, this involves an extensive number of treatment options, and is a complaint that carries with it a high risk of complications and morbidity. Disposition including potential need for admission considered.  Dispo: ICU  Additional history obtained from EMS Records reviewed ED Visit Notes The following labs were independently interpreted: Chemistry and show AKI I independently reviewed the following imaging with scope of interpretation limited to determining acute life threatening conditions related to emergency care: Chest x-ray and agree with the radiologist interpretation with the following exceptions: none I personally reviewed and interpreted cardiac monitoring: sinus tachycardia I personally reviewed and interpreted the pt's EKG: see above for interpretation  I have reviewed the patients home medications and made adjustments as needed Consults: Critical care and Nephrology Social Determinants of health:  Elderly  Final Clinical Impression(s) / ED Diagnoses Final diagnoses:  Acute respiratory failure with hypoxia ([place])  Congestive heart failure, unspecified HF chronicity, unspecified heart failure type ([place])    Rx / DC Orders ED Discharge Orders     None      CRITICAL CARE Performed by: Fransico Meadow   Total critical care time: 60 minutes  Critical care time was exclusive of separately billable procedures and treating other patients.  Critical care was necessary to treat or prevent imminent or life-threatening deterioration.  Critical care was time spent personally by me on the following activities: development of treatment plan with patient and/or surrogate as well as nursing, discussions with consultants, evaluation of  patient's response to treatment, examination of patient, obtaining history from patient or surrogate, ordering and performing treatments and interventions, ordering and review of laboratory studies, ordering and review of radiographic studies, pulse oximetry and re-evaluation of patient's condition.    Fransico Meadow, MD 08/11/22 1153

## 2022-08-11 NOTE — Progress Notes (Signed)
During dialysis, patient had a BP of 140/92. She became diaphoretic, more somnolent. Painful stimulus was applied to get her to wake up. She states that she started feeling dizzy and light headed. Dialysis RN reported that he gave a 200 ml bolus back to her. This RN stayed at the bedside until the patient became more alert. Patient was questioned about her normal blood pressure during dialysis and she told us that it's "180s over 100s."

## 2022-08-11 NOTE — Progress Notes (Signed)
RT assisted with patient transport on Bipap from ED to [id number] without complications.

## 2022-08-11 NOTE — Progress Notes (Addendum)
Received patient in bed ,awake,alert and oriented x 4.Her  blood pressure were high.She consented on her HD treatment .  Access used :Right HD catheter that worked well.Dressing changed done.No signs of infection at the insertion site.Retention suture intact.  Medicine given :None.  Duration of treatment : 3.25 hours.  Fluid removed : 3,900 cc .Unmet prescribed  fluid goal of 4.5 liters.See progress note.  Hemodialysis issue/comment: Patient was cramping every now and then during the first hour.When patient was sleeping during the last 2.0 hours,when she awoke,started to feel crampy again.Not tolerated her treatment .  Hand off to the patient nurse  at the bedside.

## 2022-08-11 NOTE — ED Notes (Signed)
Dr.Paterson shown results of Istat VBG.ED-Lab

## 2022-08-11 NOTE — ED Notes (Signed)
X RAY at bedside 

## 2022-08-11 NOTE — Consult Note (Addendum)
[facility] Renal Consultation Note    Indication for Consultation:  Management of ESRD/hemodialysis; anemia, hypertension/volume and secondary hyperparathyroidism  IK:[id number], Clyde Canterbury, MD  HPI: Monique Henderson is a 72 y.o. female with ESRD on HD TTS at [facility].  Past medical history significant for uncontrolled HTN, AF, aortic dissection s/p repair, CAD, COPD, Hep C, Hx stroke, Hx breast cancer and history polysubstance abuse.   Patient seen and examined at bedside in ED.  Poor historian d/t current acute illness/sedation.  Able to answer yes/no questions.  Denies CP and missing dialysis.  Reports compliance with medications.  Denies abdominal pain and n/v/d.  Some history gathered from chart review:  Presented to ED for shortness of breath that started last night.  When EMS arrived was hypoxic and hypertensive and placed on re breather.  Admitted to substernal CP.  Was given sublingual nitroglycerin with some improvement.  Denied sick contacts and feeling bad prior to SOB.    Of note, chart review shows patient has been compliant with prescribed dialysis regimen.  Over the last week she has not reached her dry weight due to large gain from 3/5-3/7 with refusal to pull more than 2L due to cramping with larger goals.  Left HD on Saturday almost 3L over her dry weight.  Per provider note from 3/7 patient was out of clonidine patch with plan to get it.  Pertinent findings in the ED include agitation, hypoxia improved w/BiPAP, hypertension with initial SBP>200, istat K 6.9, then 5.0 on CMP;  BNP >4500, troponin 57 and CXR with interstitial edema.  Patient being admitted to ICU for further evaluation and management.   Past Medical History:  Diagnosis Date   AF (paroxysmal atrial fibrillation) ([place]) 05/29/2019   on Coumadin   Aortic atherosclerosis ([place]) 07/05/2019   Aortic dissection ([place]) 04/04/2019   s/p repair   Bone spur 2008   Right calcaneal foot spur   Breast cancer ([place])  2004   Ductal carcinoma in situ of the left breast; S/P left partial mastectomy 02/26/2003; S/P re-excision of cranial and lateral margins11/18/2004.radiation   Carotid artery disease (HCC)    Cerebral thrombosis with cerebral infarction 05/22/2019   Chronic HFrEF (heart failure with reduced ejection fraction) (HCC)    Chronic low back pain 06/22/2016   Chronic obstructive lung disease ([place]) 01/16/2017   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 Autumn Messing; S/P re-excision of superior and inferior margins 10/27/2012.    ESRD on hemodialysis ([place]) 05/29/2019   Essential hypertension 09/16/2006   GERD 09/16/2006   Hepatitis C    treated and RNA confirmed not detectable 01/2017   Insomnia 03/14/2015   Malnutrition of moderate degree 05/19/2019   Non compliance with medical treatment 12/04/2017   Normocytic anemia    With thrombocytosis   Osteoarthritis    Right ureteral stone 2002   S/P lumbar spinal fusion 01/18/2014   S/P lumbar decompressive laminectomy, fusion, and plating for lumbar spinal stenosis on 05/27/2009 by Dr. Eustace Moore.  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 Eustace Moore.     Stroke (cerebrum) ([place])    Tobacco use disorder 04/19/2009   Uterine fibroid    Wears dentures    top   Past Surgical History:  Procedure Laterality Date  ANTERIOR LAT LUMBAR FUSION N/A 01/18/2014   Procedure: ANTERIOR LATERAL LUMBAR FUSION LUMBAR TWO-THREE;  Surgeon: Eustace Moore, MD;  Location: [place] [facility];  Service: Neurosurgery;  Laterality: N/A;   ANTERIOR LUMBAR FUSION  01/18/2014   AV FISTULA PLACEMENT Left 04/20/2019   Procedure: ARTERIOVENOUS (AV) FISTULA CREATION;  Surgeon: [name], MD;  Location: [place];  Service:  Vascular;  Laterality: Left;   BACK SURGERY     BREAST LUMPECTOMY Left 01/2003   BREAST LUMPECTOMY Right 2014   BREAST LUMPECTOMY WITH NEEDLE LOCALIZATION AND AXILLARY SENTINEL LYMPH NODE BX Right 10/13/2012   Procedure: BREAST LUMPECTOMY WITH NEEDLE LOCALIZATION;  Surgeon: [name], MD;  Location: [place];  Service: General;  Laterality: Right;  Right breast wire localized lumpectomy   INSERTION OF DIALYSIS CATHETER Right 04/20/2019   Procedure: INSERTION OF DIALYSIS CATHETER, right internal jugular;  Surgeon: [name], MD;  Location: [place];  Service: Vascular;  Laterality: Right;   INSERTION OF DIALYSIS CATHETER Right 09/24/2020   Procedure: INSERTION OF TUNNELED DIALYSIS CATHETER;  Surgeon: Rosetta Posner, MD;  Location: [place];  Service: Vascular;  Laterality: Right;   IR FLUORO GUIDE CV LINE RIGHT  09/22/2020   IR THORACENTESIS ASP PLEURAL SPACE W/IMG GUIDE  05/19/2019   IR US GUIDE VASC ACCESS LEFT  09/22/2020   IR US GUIDE VASC ACCESS RIGHT  09/22/2020   IR VENOCAVAGRAM SVC  09/22/2020   LAMINECTOMY  05/27/2009   Lumbar decompressive laminectomy, fusion and plating for lumbar spinal stensosis   LIGATION OF ARTERIOVENOUS  FISTULA Left 09/24/2020   Procedure: LIGATION OF LEFT ARM ARTERIOVENOUS  FISTULA;  Surgeon: Rosetta Posner, MD;  Location: [place];  Service: Vascular;  Laterality: Left;   LUMBAR LAMINECTOMY/DECOMPRESSION MICRODISCECTOMY Left 03/23/2013   Procedure: LUMBAR LAMINECTOMY/DECOMPRESSION MICRODISCECTOMY 1 LEVEL;  Surgeon: Eustace Moore, MD;  Location: [facility];  Service: Neurosurgery;  Laterality: Left;  LUMBAR LAMINECTOMY/DECOMPRESSION MICRODISCECTOMY 1 LEVEL   MASTECTOMY, PARTIAL Left 02/26/2003   ; S/P re-excision of cranial and lateral margins 04/19/2003.    RE-EXCISION OF BREAST CANCER,SUPERIOR MARGINS Right 10/27/2012   Procedure: RE-EXCISION OF BREAST CANCER,SUPERIOR and inferior MARGINS;  Surgeon: [name], MD;  Location: [place];   Service: General;  Laterality: Right;   RE-EXCISION OF BREAST LUMPECTOMY Left 04/2003   TEE WITHOUT CARDIOVERSION N/A 04/04/2019   Procedure: Transesophageal Echocardiogram (Tee);  Surgeon: Wonda Olds, MD;  Location: [place];  Service: Open Heart Surgery;  Laterality: N/A;   THORACIC AORTIC ANEURYSM REPAIR N/A 04/04/2019   Procedure: THORACIC ASCENDING ANEURYSM REPAIR (AAA)  USING 28 MM X 30 CM HEMASHIELD PLATINUM VASCULAR GRAFT;  Surgeon: Wonda Olds, MD;  Location: [facility];  Service: Open Heart Surgery;  Laterality: N/A;   Family History  Problem Relation Age of Onset   Colon cancer Mother 73   Hypertension Mother    Diabetes Sister 67   Hypertension Sister    Diabetes Brother    Hypertension Brother    Diabetes Brother    Hypertension Brother    Kidney disease Son        s/p renal transplant   Hypertension Son    Diabetes Son    Multiple sclerosis Son    Bone cancer Sister 99   Breast cancer Neg Hx    Cervical cancer Neg Hx    Social History:  reports that she quit smoking about 2 years ago. Her smoking use included cigarettes.  She has a 11.00 pack-year smoking history. She has never used smokeless tobacco. She reports that she does not currently use alcohol after a past usage of about 2.0 standard drinks of alcohol per week. She reports that she does not currently use drugs after having used the following drugs: Cocaine. Allergies  Allergen Reactions   Shrimp [Shellfish Allergy] Shortness Of Breath   Bactroban [Mupirocin] Other (See Comments)    "Sores in nose"   Hydrocodone Itching, Nausea Only and Nausea And Vomiting   Chlorhexidine Gluconate Itching   Tylenol [Acetaminophen] Itching   Zestril [Lisinopril] Cough   Prior to Admission medications   Medication Sig Start Date End Date Taking? Authorizing Provider  albuterol (VENTOLIN HFA) 108 (90 Base) MCG/ACT inhaler Inhale 2 puffs into the lungs every 6 (six) hours as needed for wheezing or shortness of breath.  07/30/20   Maximiano Coss, NP  amLODipine (NORVASC) 10 MG tablet Take 1 tablet (10 mg total) by mouth daily. 03/06/22   Setzer, Edman Circle, PA-C  apixaban (ELIQUIS) 2.5 MG TABS tablet Take 1 tablet (2.5 mg total) by mouth 2 (two) times daily. 03/06/22   Setzer, Edman Circle, PA-C  carvedilol (COREG) 25 MG tablet Take 1 tablet (25 mg total) by mouth 2 (two) times daily with a meal. 05/01/22 05/02/23  Ghimire, Henreitta Leber, MD  Cholecalciferol (VITAMIN D-3 PO) Take 1 capsule by mouth daily.    [provider]  cloNIDine (CATAPRES - DOSED IN MG/24 HR) 0.1 mg/24hr patch Place 1 patch (0.1 mg total) onto the skin every Saturday. 03/07/22   Setzer, Edman Circle, PA-C  diphenhydrAMINE-zinc acetate (BENADRYL) cream Apply topically 3 (three) times daily as needed for itching. 03/06/22   Setzer, Edman Circle, PA-C  hydrALAZINE (APRESOLINE) 25 MG tablet Take 2 tablets (50 mg total) by mouth 2 (two) times daily. 05/01/22   Ghimire, Henreitta Leber, MD  isosorbide mononitrate (IMDUR) 30 MG 24 hr tablet Take 1 tablet (30 mg total) by mouth daily. 04/20/22   Dorna Mai, MD  lidocaine (LIDODERM) 5 % Place 1 patch onto the skin daily. Remove & Discard patch within 12 hours or as directed by MD 03/07/22   Vaughan Basta, Edman Circle, PA-C  naloxone [facility]) nasal spray 4 mg/0.1 mL 1 spray as directed. 02/19/22   [provider]  oxyCODONE-acetaminophen (PERCOCET) 10-325 MG tablet Take 1 tablet by mouth 3 (three) times daily as needed. 03/12/22   [provider]  pantoprazole (PROTONIX) 40 MG tablet Take 1 tablet (40 mg total) by mouth 2 (two) times daily. 05/01/22   Ghimire, Henreitta Leber, MD  rosuvastatin (CRESTOR) 10 MG tablet TAKE 1 TABLET (10 MG TOTAL) BY MOUTH DAILY FOR CHOLESTEROL 08/07/22   Dorna Mai, MD  sevelamer carbonate (RENVELA) 2.4 g PACK Take 2.4 g by mouth 3 (three) times daily with meals. 03/06/22   Setzer, Edman Circle, PA-C  traZODone (DESYREL) 50 MG tablet Take 0.5 tablets (25 mg total) by mouth at bedtime as needed  for sleep. 04/20/22   Dorna Mai, MD   Current Facility-Administered Medications  Medication Dose Route Frequency Provider Last Rate Last Admin   calcium gluconate 1 g/ 50 mL sodium chloride IVPB  1 g Intravenous Once Fransico Meadow, MD 50 mL/hr at 08/11/22 0948 1,000 mg at 08/11/22 0948   dexmedetomidine (PRECEDEX) 400 MCG/100ML (4 mcg/mL) infusion  0-1.2 mcg/kg/hr Intravenous Titrated Fransico Meadow, MD 4.81 mL/hr at 08/11/22 0936 0.4 mcg/kg/hr at 08/11/22 0936   nitroGLYCERIN 50 mg in dextrose 5 % 250  mL (0.2 mg/mL) infusion  0-200 mcg/min Intravenous Titrated Fransico Meadow, MD 33 mL/hr at 08/11/22 0950 110 mcg/min at 08/11/22 0950   sodium bicarbonate injection 50 mEq  50 mEq Intravenous Once Claudia Desanctis, MD       Current Outpatient Medications  Medication Sig Dispense Refill   albuterol (VENTOLIN HFA) 108 (90 Base) MCG/ACT inhaler Inhale 2 puffs into the lungs every 6 (six) hours as needed for wheezing or shortness of breath. 8.5 g 0   amLODipine (NORVASC) 10 MG tablet Take 1 tablet (10 mg total) by mouth daily. 30 tablet 0   apixaban (ELIQUIS) 2.5 MG TABS tablet Take 1 tablet (2.5 mg total) by mouth 2 (two) times daily. 60 tablet 0   carvedilol (COREG) 25 MG tablet Take 1 tablet (25 mg total) by mouth 2 (two) times daily with a meal. 60 tablet 1   Cholecalciferol (VITAMIN D-3 PO) Take 1 capsule by mouth daily.     cloNIDine (CATAPRES - DOSED IN MG/24 HR) 0.1 mg/24hr patch Place 1 patch (0.1 mg total) onto the skin every Saturday. 4 patch 0   diphenhydrAMINE-zinc acetate (BENADRYL) cream Apply topically 3 (three) times daily as needed for itching. 28.4 g 0   hydrALAZINE (APRESOLINE) 25 MG tablet Take 2 tablets (50 mg total) by mouth 2 (two) times daily. 120 tablet 1   isosorbide mononitrate (IMDUR) 30 MG 24 hr tablet Take 1 tablet (30 mg total) by mouth daily. 90 tablet 1   lidocaine (LIDODERM) 5 % Place 1 patch onto the skin daily. Remove & Discard patch within 12 hours  or as directed by MD 30 patch 0   naloxone (NARCAN) nasal spray 4 mg/0.1 mL 1 spray as directed.     oxyCODONE-acetaminophen (PERCOCET) 10-325 MG tablet Take 1 tablet by mouth 3 (three) times daily as needed.     pantoprazole (PROTONIX) 40 MG tablet Take 1 tablet (40 mg total) by mouth 2 (two) times daily. 60 tablet 1   rosuvastatin (CRESTOR) 10 MG tablet TAKE 1 TABLET (10 MG TOTAL) BY MOUTH DAILY FOR CHOLESTEROL 90 tablet 0   sevelamer carbonate (RENVELA) 2.4 g PACK Take 2.4 g by mouth 3 (three) times daily with meals. 90 each 0   traZODone (DESYREL) 50 MG tablet Take 0.5 tablets (25 mg total) by mouth at bedtime as needed for sleep. 45 tablet 0   Labs: Basic Metabolic Panel: Recent Labs  Lab 08/11/22 0929  NA 136  K 6.9*   CBC: Recent Labs  Lab 08/11/22 0929  HGB 13.3  HCT 39.0    CBG: Recent Labs  Lab 08/11/22 0917  GLUCAP 116*   Studies/Results: DG Chest Portable 1 View  Result Date: 08/11/2022 CLINICAL DATA:  Shortness of breath. EXAM: PORTABLE CHEST 1 VIEW COMPARISON:  07/04/2022 FINDINGS: The right IJ dialysis catheter is stable. Stable cardiac enlargement and vascular congestion. Interstitial pulmonary edema with thickened peripheral interstitial septa ([place] B-lines). No definite pleural effusions. The bony thorax is intact. IMPRESSION: Cardiac enlargement and vascular congestion with interstitial pulmonary edema. Electronically Signed   By: Marijo Sanes M.D.   On: 08/11/2022 09:55    ROS: All others negative except those listed in HPI.  Physical Exam: Vitals:   08/11/22 0930 08/11/22 0945 08/11/22 0946 08/11/22 0950  BP: (!) 211/191 (!) 207/116    Pulse: 89 86  78  Resp: 20 (!) 21  (!) 28  Temp:   (!) 97.2 F (36.2 C)   TempSrc:   Temporal  SpO2: 100% 100%  100%  Weight:      Height:         General: ill appearing, frail, calm female laying in bed on BiPAP Head: NCAT sclera not icteric MMM Neck: Supple. No lymphadenopathy Lungs: on BiPAP, +crackles  b/l Heart: RRR. No murmur, rubs or gallops.  Abdomen: soft, nontender, +BS, no guarding, no rebound tenderness M/S:  Equal strength b/l in upper and lower extremities.  Lower extremities:no edema b/l Neuro: drowsy Dialysis Access: [facility]  Dialysis Orders:  TTS - SW  3.5hrs, BFR 400, DFR AF 1.5,  EDW 48.3kg, 2K/ 2Ca  Access: TDC  Heparin none Venofer '100mg'$  qHD x10 - 10 completed Hectorol 39mg IV qHD    Assessment/Plan:  Acute hypoxic respiratory failure - 2/2 pulmonary edema. Plan for HD today once in a room Hypertension - historically poorly controlled often d/t non compliance.  Home meds restarted.   ESRD -  on HD TTS.  No missed treatments but not getting to dry. HD today per regular schedule. K 5.0. Volume  - pulmonary edema w/volume overload.  Left HD 3L over dry Saturday.  Net UF goal 4.5L today.   Anemia of CKD - Hgb 13 - no indication for ESA or iron therapy  Secondary Hyperparathyroidism -  Calcium in goal.  Check phos.  Continue VDRA and binders.   Nutrition - Renal diet w/fluid restrictions when eating.  Remind of importance of fluid restrictions especially since she is unwilling to remove more than net 1.5L each HD as outpatient.  Hx CVA Hx COPD A fib - per primary team   LJen Mow PA-C [facility] 08/11/2022, 10:10 AM    Seen and examined independently.  Agree with note and exam as documented above by physician extender and as noted here.  Patient presented to the ER with shortness of breath.  She was found to be markedly hypertensive.  She was placed on BIPAP and a nitro gtt.  Nitro gtt running at 120 mcg/min as of my exam.  She shakes her head "no" when asked if she has run out of any medications.  Reports medication compliance.  Hx limited with BIPAP and breathing.    General adult female in bed on bipap  HEENT normocephalic atraumatic extraocular movements intact sclera anicteric Neck supple trachea midline Lungs crackles bilaterally on  bipap with normal work of breathing at rest  Heart S1S2 no rub Abdomen soft nontender nondistended Extremities no pitting edema  Psych normal mood and affect Neuro - limited with resp status and bipap  Access RIJ tunn catheter   Acute hypoxic resp failure  - optimize volume status with HD - on BIPAP   HTN emergency  - optimize volume status with HD - on nitro gtt and can likely wean once HD is started  - HD shortly as below   Hyperkalemia - improved with temporizing measures   ESRD  - HD today and per TTS schedule  - she is getting dialyzed in the ICU due to being on nitro gtt.  Spoke with charge RN and a nurse is coming to do her treatment shortly   Anemia CKD - no current indication for ESA  Metabolic bone disease/secondary hyperparathyroidism  - Continue hectorol - continue binders when taking PO   Afib - per primary team    LClaudia Desanctis MD 08/11/2022 2:01 PM

## 2022-08-11 NOTE — H&P (Addendum)
NAME:  Monique Henderson, MRN:  [id number], DOB:  16-Nov-1950, LOS: 0 ADMISSION DATE:  08/11/2022, CONSULTATION DATE:  08/11/2022 REFERRING MD:  Dr. 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 Found to be hypoxemic and severely hypertensive with concern for flash pulmonary edema and put on nitro drip and BiPAP in the ED. She has had similar presentations in the recent past after missing dialysis. She denies any recent missed dialysis sessions, missed medications, or recreational drug use. She is not currently having chest pain. She does have swelling and warmth in her right arm compared to the left which she states has been there for a while.   She was also anxious and agitated without response to IM versed so she was started on precedex in the ED. On our assessment she was calm and moderately sedated but could answer most of our questions before closing her eyes again.  Dialysis dependent for a few months 11-05/2019 2/2 ischemic ATN following hypotensive event s/p surgical repair of type 1 aortic dissection. LUE AVF placement in 04/2019 which was first used in 08/2020 and was found to be occluded, underwent ligation and placement of right IJ TDC. Dialysis dependent since then with no new access. Right IJ TDC in place today, unknown placement date.   Pertinent  Medical History  ESRD on HD TTS HTN CVA HFmrEF Type 1 aortic dissection s/p surgical repair Paroxysmal A fib on eliquis COPD Polysubstance use  Hepatitis C (treated in 2018)  Significant Hospital Events: Including procedures, antibiotic start and stop dates in addition to other pertinent events     Interim  History / Subjective:  Pt is moderately sedated but able to answer most questions briefly. Still having dyspnea but no chest pain.  Objective   Blood pressure (!) 207/116, pulse 78, temperature (!) 97.2 F (36.2 C), temperature source Temporal, resp. rate (!) 28, height '5\' 3"'$  (1.6 m), weight 48.1 kg, SpO2 100 %.    Vent Mode: BIPAP;PCV FiO2 (%):  [40 %-100 %] 40 % Set Rate:  [15 bmp] 15 bmp PEEP:  [5 cmH20] 5 cmH20   Intake/Output Summary (Last 24 hours) at 08/11/2022 1050 Last data filed at 08/11/2022 0950 Gross per 24 hour  Intake 9.81 ml  Output --  Net 9.81 ml   Filed Weights   08/11/22 0909  Weight: 48.1 kg    Examination: General: Ill appearing elderly female laying in bed on BiPAP HENT: [place]/AT Lungs: diffuse crackles, no increased work of breathing on BiPAP Cardiovascular: RRR, palpable bilateral radial pulses  Abdomen: soft, non-tender, non-distended, positive bowel sounds Extremities: No LE edema, right arm with trace pitting edema from the axilla to fingers, no tenderness or discharge from R IJ TDC but it does appear dirty Neuro: drowsy but arousable, no focal deficits   Resolved Hospital Problem list     Assessment & Plan:  Acute hypoxemic respiratory failure 2/2 pulmonary edema Severe hypertension -BiPAP, titrate off precedex to improve sedation -Nitro gtt -HD today -restart home meds- amlodipine, carvedilol, clonidine, hydralazine, isosorbide mononitrate  ESRD on HD TTS Ischemic ATN 2/2 hypotension following TAA repair -HD per nephro -renvela  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%) Type 1 aortic dissection s/p surgical  repair Paroxysmal A fib on eliquis -continue eliquis -BP control as above with nitro gtt and adding home medications -volume management with HD  CVA -no new or worsening deficits -continue eliquis   Right upper extremity swelling -will consider RUE [place] for DVT evaluation after HD  Polysubstance Use -denies cocaine use    Hyperkalemia -resolved after insulin, given calcium in ED  COPD Tobacco use disorder -no formal PFTs -prescribed albuterol inhaler only -will monitor for wheezing and treat accordingly   Best Practice (right click and "Reselect all SmartList Selections" daily)   Diet/type: NPO w/ oral meds DVT prophylaxis: DOAC GI prophylaxis: N/A Lines: Dialysis Catheter Foley:  N/A Code Status:  full code Last date of multidisciplinary goals of care discussion [.]  Labs   CBC: Recent Labs  Lab 08/11/22 0929  HGB 13.3  HCT 123XX123    Basic Metabolic Panel: Recent Labs  Lab 08/11/22 0929 08/11/22 0933  NA 136 142  K 6.9* 5.0  CL  --  102  CO2  --  24  GLUCOSE  --  121*  BUN  --  59*  CREATININE  --  9.97*  CALCIUM  --  9.7  MG  --  2.3   GFR: Estimated Creatinine Clearance: 4 mL/min (A) (by C-G formula based on SCr of 9.97 mg/dL (H)). No results for input(s): "PROCALCITON", "WBC", "LATICACIDVEN" in the last 168 hours.  Liver Function Tests: Recent Labs  Lab 08/11/22 0933  AST 29  ALT 28  ALKPHOS 97  BILITOT 0.8  PROT 7.9  ALBUMIN 4.3   No results for input(s): "LIPASE", "AMYLASE" in the last 168 hours. No results for input(s): "AMMONIA" in the last 168 hours.  ABG    Component Value Date/Time   PHART 7.508 (H) 01/29/2022 1132   PCO2ART 38.0 01/29/2022 1132   PO2ART 61 (L) 01/29/2022 1132   HCO3 24.6 08/11/2022 0929   TCO2 25 08/11/2022 0929   ACIDBASEDEF 8.0 (H) 04/06/2019 1748   O2SAT 99 08/11/2022 0929     Coagulation Profile: No results for input(s): "INR", "PROTIME" in the last 168 hours.  Cardiac Enzymes: No results for input(s): "CKTOTAL", "CKMB", "CKMBINDEX", "TROPONINI" in the last 168 hours.  HbA1C: Hgb A1c MFr Bld  Date/Time Value Ref Range Status  02/23/2022 06:46 PM 4.7 (L) 4.8 - 5.6 % Final    Comment:    (NOTE) Pre diabetes:          5.7%-6.4%  Diabetes:              >6.4%  Glycemic control for   <7.0% adults with diabetes    06/20/2021 06:08 PM 5.1 4.8 - 5.6 % Final    Comment:    (NOTE)         Prediabetes: 5.7 - 6.4         Diabetes: >6.4         Glycemic control for adults with diabetes: <7.0     CBG: Recent Labs  Lab 08/11/22 0917  GLUCAP 116*    Review of Systems:   No chest pain Dyspnea   Past Medical History:  She,  has a past medical history of AF (paroxysmal atrial fibrillation) ([place]) (05/29/2019), Aortic atherosclerosis ([place]) (07/05/2019), Aortic dissection ([place]) (04/04/2019), Bone spur (2008), Breast cancer ([place]) (2004), Carotid artery disease ([place]), Cerebral thrombosis with cerebral infarction (05/22/2019), Chronic HFrEF (heart failure with reduced ejection fraction) (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   Surgical History:   Past Surgical History:  Procedure Laterality Date   ANTERIOR LAT LUMBAR FUSION N/A 01/18/2014   Procedure: ANTERIOR LATERAL LUMBAR FUSION LUMBAR TWO-THREE;  Surgeon: Eustace Moore, MD;  Location: [facility];  Service: Neurosurgery;  Laterality: N/A;   ANTERIOR LUMBAR FUSION  01/18/2014   AV FISTULA PLACEMENT Left 04/20/2019   Procedure: ARTERIOVENOUS (AV) FISTULA CREATION;  Surgeon: [name], MD;  Location: [facility];  Service: Vascular;  Laterality: Left;   BACK SURGERY     BREAST LUMPECTOMY Left 01/2003   BREAST LUMPECTOMY Right 2014   BREAST LUMPECTOMY WITH NEEDLE LOCALIZATION AND AXILLARY SENTINEL LYMPH NODE BX  Right 10/13/2012   Procedure: BREAST LUMPECTOMY WITH NEEDLE LOCALIZATION;  Surgeon: [name], MD;  Location: [place];  Service: General;  Laterality: Right;  Right breast wire localized lumpectomy   INSERTION OF DIALYSIS CATHETER Right 04/20/2019   Procedure: INSERTION OF DIALYSIS CATHETER, right internal jugular;  Surgeon: [name], MD;  Location: [place];  Service: Vascular;  Laterality: Right;   INSERTION OF DIALYSIS CATHETER Right 09/24/2020   Procedure: INSERTION OF TUNNELED DIALYSIS CATHETER;  Surgeon: Rosetta Posner, MD;  Location: [place];  Service: Vascular;  Laterality: Right;   IR FLUORO GUIDE CV LINE RIGHT  09/22/2020   IR THORACENTESIS ASP PLEURAL SPACE W/IMG GUIDE  05/19/2019   IR US GUIDE VASC ACCESS LEFT  09/22/2020   IR US GUIDE VASC ACCESS RIGHT  09/22/2020   IR VENOCAVAGRAM SVC  09/22/2020   LAMINECTOMY  05/27/2009   Lumbar decompressive laminectomy, fusion and plating for lumbar spinal stensosis   LIGATION OF ARTERIOVENOUS  FISTULA Left 09/24/2020   Procedure: LIGATION OF LEFT ARM ARTERIOVENOUS  FISTULA;  Surgeon: Rosetta Posner, MD;  Location: [place];  Service: Vascular;  Laterality: Left;   LUMBAR LAMINECTOMY/DECOMPRESSION MICRODISCECTOMY Left 03/23/2013   Procedure: LUMBAR LAMINECTOMY/DECOMPRESSION MICRODISCECTOMY 1 LEVEL;  Surgeon: Eustace Moore, MD;  Location: [facility];  Service: Neurosurgery;  Laterality: Left;  LUMBAR LAMINECTOMY/DECOMPRESSION MICRODISCECTOMY 1 LEVEL   MASTECTOMY, PARTIAL Left 02/26/2003   ; S/P re-excision of cranial and lateral margins 04/19/2003.    RE-EXCISION OF BREAST CANCER,SUPERIOR MARGINS Right 10/27/2012   Procedure: RE-EXCISION OF BREAST CANCER,SUPERIOR and inferior MARGINS;  Surgeon: [name], MD;  Location: [place];  Service: General;  Laterality: Right;   RE-EXCISION OF BREAST LUMPECTOMY Left 04/2003   TEE WITHOUT CARDIOVERSION N/A 04/04/2019   Procedure: Transesophageal Echocardiogram ([place]);  Surgeon:  Wonda Olds, MD;  Location: [employer] [facility];  Service: Open Heart Surgery;  Laterality: N/A;   THORACIC AORTIC ANEURYSM REPAIR N/A 04/04/2019   Procedure: THORACIC ASCENDING ANEURYSM REPAIR (AAA)  USING 28 MM X 30 CM HEMASHIELD PLATINUM VASCULAR GRAFT;  Surgeon: Wonda Olds, MD;  Location: [facility];  Service: Open Heart Surgery;  Laterality: N/A;     Social History:   reports that she quit smoking about 2 years ago. Her smoking use included cigarettes. She has a 11.00 pack-year smoking history. She has never used smokeless tobacco. She reports that she does not currently use alcohol after a past usage of about 2.0 standard drinks of  alcohol per week. She reports that she does not currently use drugs after having used the following drugs: Cocaine.   Family History:  Her family history includes Bone cancer (age of onset: 74) in her sister; Colon cancer (age of onset: 32) in her mother; Diabetes in her brother, brother, and son; Diabetes (age of onset: 56) in her sister; Hypertension in her brother, brother, mother, sister, and son; Kidney disease in her son; Multiple sclerosis in her son. There is no history of Breast cancer or Cervical cancer.   Allergies Allergies  Allergen Reactions   Shrimp [Shellfish Allergy] Shortness Of Breath   Bactroban [Mupirocin] Other (See Comments)    "Sores in nose"   Hydrocodone Itching, Nausea Only and Nausea And Vomiting   Chlorhexidine Gluconate Itching   Tylenol [Acetaminophen] Itching   Zestril [Lisinopril] Cough     Home Medications  Prior to Admission medications   Medication Sig Start Date End Date Taking? Authorizing Provider  albuterol (VENTOLIN HFA) 108 (90 Base) MCG/ACT inhaler Inhale 2 puffs into the lungs every 6 (six) hours as needed for wheezing or shortness of breath. 07/30/20   Maximiano Coss, NP  amLODipine (NORVASC) 10 MG tablet Take 1 tablet (10 mg total) by mouth daily. 03/06/22   Setzer, Edman Circle, PA-C  apixaban (ELIQUIS) 2.5 MG TABS tablet  Take 1 tablet (2.5 mg total) by mouth 2 (two) times daily. 03/06/22   Setzer, Edman Circle, PA-C  carvedilol (COREG) 25 MG tablet Take 1 tablet (25 mg total) by mouth 2 (two) times daily with a meal. 05/01/22 05/02/23  Ghimire, Henreitta Leber, MD  Cholecalciferol (VITAMIN D-3 PO) Take 1 capsule by mouth daily.    [provider]  cloNIDine (CATAPRES - DOSED IN MG/24 HR) 0.1 mg/24hr patch Place 1 patch (0.1 mg total) onto the skin every Saturday. 03/07/22   Setzer, Edman Circle, PA-C  diphenhydrAMINE-zinc acetate (BENADRYL) cream Apply topically 3 (three) times daily as needed for itching. 03/06/22   Setzer, Edman Circle, PA-C  hydrALAZINE (APRESOLINE) 25 MG tablet Take 2 tablets (50 mg total) by mouth 2 (two) times daily. 05/01/22   Ghimire, Henreitta Leber, MD  isosorbide mononitrate (IMDUR) 30 MG 24 hr tablet Take 1 tablet (30 mg total) by mouth daily. 04/20/22   Dorna Mai, MD  lidocaine (LIDODERM) 5 % Place 1 patch onto the skin daily. Remove & Discard patch within 12 hours or as directed by MD 03/07/22   Vaughan Basta, Edman Circle, PA-C  naloxone [facility]) nasal spray 4 mg/0.1 mL 1 spray as directed. 02/19/22   [provider]  oxyCODONE-acetaminophen (PERCOCET) 10-325 MG tablet Take 1 tablet by mouth 3 (three) times daily as needed. 03/12/22   [provider]  pantoprazole (PROTONIX) 40 MG tablet Take 1 tablet (40 mg total) by mouth 2 (two) times daily. 05/01/22   Ghimire, Henreitta Leber, MD  rosuvastatin (CRESTOR) 10 MG tablet TAKE 1 TABLET (10 MG TOTAL) BY MOUTH DAILY FOR CHOLESTEROL 08/07/22   Dorna Mai, MD  sevelamer carbonate (RENVELA) 2.4 g PACK Take 2.4 g by mouth 3 (three) times daily with meals. 03/06/22   Setzer, Edman Circle, PA-C  traZODone (DESYREL) 50 MG tablet Take 0.5 tablets (25 mg total) by mouth at bedtime as needed for sleep. 04/20/22   Dorna Mai, MD     Critical care time: 51

## 2022-08-11 NOTE — Progress Notes (Addendum)
Last 30 minutes of her treatment ,patient woke up,called the nurse and said ,I'm moving my bowel,then complained of cramping ,100 CC OF NS ,she was placed on a bed pan .and UF stopped.Her mentation become erratic,confused ,lethargic and slow to respond ,and staring blankly ,patient's nurse was at the bedside. Vitals were normal ,BP 142/90,the lowest during her treatment,patient has a pulse 63 beats per minute,patient is concious but light headed.HD nurse gave another bolus of  100.uf still on hold for more or less 30 minutes.HD nurse stopped treatment and returned the 300 cc from HD machine back to her system.

## 2022-08-11 NOTE — ED Notes (Signed)
Per MD, titrate nitro to 192mgs

## 2022-08-11 NOTE — ED Triage Notes (Signed)
Pt BIB EMS due to SOB. Pt coming from home and is dialysis pt. Pt is on Tues, Thursday, and Saturday dialysis. Pt had full tx on Saturday. Pt arrives with HTN in [id number] systolic and on a NRB. Pts initial saturation was 80%. Pt received 1 nitro by EMS. [id number].

## 2022-08-12 ENCOUNTER — Inpatient Hospital Stay (HOSPITAL_COMMUNITY): Payer: 59

## 2022-08-12 DIAGNOSIS — M7989 Other specified soft tissue disorders: Secondary | ICD-10-CM

## 2022-08-12 LAB — CBC
HCT: 33.1 % — ABNORMAL LOW (ref 36.0–46.0)
Hemoglobin: 10.6 g/dL — ABNORMAL LOW (ref 12.0–15.0)
MCH: 30 pg (ref 26.0–34.0)
MCHC: 32 g/dL (ref 30.0–36.0)
MCV: 93.8 fL (ref 80.0–100.0)
Platelets: 149 10*3/uL — ABNORMAL LOW (ref 150–400)
RBC: 3.53 MIL/uL — ABNORMAL LOW (ref 3.87–5.11)
RDW: 17.4 % — ABNORMAL HIGH (ref 11.5–15.5)
WBC: 4.2 10*3/uL (ref 4.0–10.5)
nRBC: 0 % (ref 0.0–0.2)

## 2022-08-12 LAB — GLUCOSE, CAPILLARY
Glucose-Capillary: 125 mg/dL — ABNORMAL HIGH (ref 70–99)
Glucose-Capillary: 112 mg/dL — ABNORMAL HIGH (ref 70–99)
Glucose-Capillary: 107 mg/dL — ABNORMAL HIGH (ref 70–99)
Glucose-Capillary: 113 mg/dL — ABNORMAL HIGH (ref 70–99)
Glucose-Capillary: 119 mg/dL — ABNORMAL HIGH (ref 70–99)

## 2022-08-12 LAB — BASIC METABOLIC PANEL
Anion gap: 11 (ref 5–15)
BUN: 23 mg/dL (ref 8–23)
CO2: 29 mmol/L (ref 22–32)
Calcium: 9.1 mg/dL (ref 8.9–10.3)
Chloride: 99 mmol/L (ref 98–111)
Creatinine, Ser: 5.49 mg/dL — ABNORMAL HIGH (ref 0.44–1.00)
GFR, Estimated: 8 mL/min — ABNORMAL LOW (ref >60)
Glucose, Bld: 119 mg/dL — ABNORMAL HIGH (ref 70–99)
Potassium: 3.6 mmol/L (ref 3.5–5.1)
Sodium: 139 mmol/L (ref 135–145)

## 2022-08-12 LAB — PHOSPHORUS: Phosphorus: 4.6 mg/dL (ref 2.5–4.6)

## 2022-08-12 LAB — TROPONIN I (HIGH SENSITIVITY)
Troponin I (High Sensitivity): 68 ng/L — ABNORMAL HIGH (ref <18)
Troponin I (High Sensitivity): 58 ng/L — ABNORMAL HIGH (ref <18)

## 2022-08-12 LAB — MRSA NEXT GEN BY PCR, NASAL: MRSA by PCR Next Gen: NOT DETECTED

## 2022-08-12 LAB — MAGNESIUM: Magnesium: 1.7 mg/dL (ref 1.7–2.4)

## 2022-08-12 MED ORDER — MAGNESIUM SULFATE 2 GM/50ML IV SOLN
2.0000 g | Freq: Once | INTRAVENOUS | Status: AC
  Administered 2022-08-12: 2 g via INTRAVENOUS
  Filled 2022-08-12: qty 50

## 2022-08-12 MED ORDER — LABETALOL HCL 5 MG/ML IV SOLN
20.0000 mg | INTRAVENOUS | Status: DC | PRN
  Administered 2022-08-12 (×4): 20 mg via INTRAVENOUS
  Filled 2022-08-12 (×4): qty 4

## 2022-08-12 NOTE — Progress Notes (Signed)
NAME:  Monique Henderson, MRN:  [id number], DOB:  31-Dec-1950, LOS: 1 ADMISSION DATE:  08/11/2022, CONSULTATION DATE:  08/11/2022 REFERRING MD:  Dr. 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 Found to be hypoxemic and severely hypertensive with concern for flash pulmonary edema and put on nitro drip and BiPAP in the ED. She has had similar presentations in the recent past after missing dialysis. She denies any recent missed dialysis sessions, missed medications, or recreational drug use. She is not currently having chest pain. She does have swelling and warmth in her right arm compared to the left which she states has been there for a while.   She was also anxious and agitated without response to IM versed so she was started on precedex in the ED. On our assessment she was calm and moderately sedated but could answer most of our questions before closing her eyes again.  Dialysis dependent for a few months 11-05/2019 2/2 ischemic ATN following hypotensive event s/p surgical repair of type 1 aortic dissection. LUE AVF placement in 04/2019 which was first used in 08/2020 and was found to be occluded, underwent ligation and placement of right IJ TDC. Dialysis dependent since then with no new access. Right IJ TDC in place today, unknown placement date.   Pertinent  Medical History  ESRD on HD TTS HTN CVA HFmrEF Type 1 aortic dissection s/p surgical repair Paroxysmal A fib on eliquis COPD Polysubstance use  Hepatitis C (treated in 2018)  Significant Hospital Events: Including procedures, antibiotic start and stop dates in addition to other pertinent events   3/12 admit  hypertensive crisis, pulm edema. BiPAP, Nitro drip. Needing HD.  3/13 HD overnight. Off BiPAP and nitro.   Interim History / Subjective:  HD overnight. Off BiPAP. Off nitro gtt. Doing much better. On 2L [place].   Objective   Blood pressure (!) 171/88, pulse 86, temperature 98.6 F (37 C), temperature source Oral, resp. rate 16, height '5\' 3"'$  (1.6 m), weight 49.5 kg, SpO2 94 %.    Vent Mode: BIPAP;PCV FiO2 (%):  [40 %-100 %] 40 % Set Rate:  [15 bmp] 15 bmp PEEP:  [5 cmH20] 5 cmH20   Intake/Output Summary (Last 24 hours) at 08/12/2022 [id number] Last data filed at 08/12/2022 0000 Gross per 24 hour  Intake 512.16 ml  Output 3900 ml  Net -3387.84 ml    Filed Weights   08/11/22 1235 08/11/22 1433 08/12/22 0500  Weight: 51.8 kg 51.8 kg 49.5 kg    Examination:  General: Elderly female in NAD HENT: /AT, PERRL, still some JVD.  Lungs: Clear bilateral breath sounds.  Cardiovascular: RRR, no MRG Abdomen: Soft, NT, ND Extremities: No acute deformity or ROM limitation. No edema.  Neuro: Alert, oriented, non-focal. Some mild confusion.   Resolved Hospital Problem list     Assessment & Plan:  Acute hypoxemic respiratory failure 2/2 pulmonary edema > improved Severe hypertension > improved -Supplemental O2 to keep sat goals 90-98% -DC nitro -HD per nephrology for volume management.  -continue home meds- amlodipine, carvedilol, clonidine, hydralazine, isosorbide mononitrate. Still needs some fine tuning. May just need an additional HD.  -PRN labetalol to keep SBP < 170 mmHg  ESRD  on HD TTS Ischemic ATN 2/2 hypotension following TAA repair -HD per nephro -renvela - Sounds like the patient has limited the amount a volume she would allow staff and the HD center to remove. Willing to do more now.   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%) Type 1 aortic dissection s/p surgical repair Paroxysmal A fib on eliquis -Continue eliquis -volume management with HD - BP control as above  CVA -no new or  worsening deficits -continue eliquis   Right upper extremity swelling - DVT study pending  Polysubstance Use -denies cocaine use, u tox negative  Hyperkalemia -resolved after insulin, given calcium in ED, HD  COPD Tobacco use disorder -no formal PFTs -prescribed albuterol inhaler only -will monitor for wheezing and treat accordingly   Best Practice (right click and "Reselect all SmartList Selections" daily)   Diet/type: Regular consistency (see orders) and NPO w/ oral meds DVT prophylaxis: DOAC GI prophylaxis: N/A Lines: Dialysis Catheter tunneled Foley:  N/A Code Status:  full code Last date of multidisciplinary goals of care discussion '[ ]'$   Labs   CBC: Recent Labs  Lab 08/11/22 0929 08/12/22 0156  WBC  --  4.2  HGB 13.3 10.6*  HCT 39.0 33.1*  MCV  --  93.8  PLT  --  149*     Basic Metabolic Panel: Recent Labs  Lab 08/11/22 0929 08/11/22 0933 08/11/22 1059 08/12/22 0156  NA 136 142  --  139  K 6.9* 5.0  --  3.6  CL  --  102  --  99  CO2  --  24  --  29  GLUCOSE  --  121* 133* 119*  BUN  --  59*  --  23  CREATININE  --  9.97*  --  5.49*  CALCIUM  --  9.7  --  9.1  MG  --  2.3  --  1.7  PHOS  --   --   --  4.6    GFR: Estimated Creatinine Clearance: 7.6 mL/min (A) (by C-G formula based on SCr of 5.49 mg/dL (H)). Recent Labs  Lab 08/12/22 0156  WBC 4.2    Liver Function Tests: Recent Labs  Lab 08/11/22 0933  AST 29  ALT 28  ALKPHOS 97  BILITOT 0.8  PROT 7.9  ALBUMIN 4.3    No results for input(s): "LIPASE", "AMYLASE" in the last 168 hours. No results for input(s): "AMMONIA" in the last 168 hours.  ABG    Component Value Date/Time   PHART 7.508 (H) 01/29/2022 1132   PCO2ART 38.0 01/29/2022 1132   PO2ART 61 (L) 01/29/2022 1132   HCO3 24.6 08/11/2022 0929   TCO2 25 08/11/2022 0929   ACIDBASEDEF 8.0 (H) 04/06/2019 1748   O2SAT 99 08/11/2022 0929     Coagulation Profile: No results for input(s): "INR", "PROTIME" in the last  168 hours.  Cardiac Enzymes: No results for input(s): "CKTOTAL", "CKMB", "CKMBINDEX", "TROPONINI" in the last 168 hours.  HbA1C: Hgb A1c MFr Bld  Date/Time Value Ref Range Status  02/23/2022 06:46 PM 4.7 (L) 4.8 - 5.6 % Final    Comment:    (NOTE) Pre diabetes:          5.7%-6.4%  Diabetes:              >6.4%  Glycemic control for   <7.0% adults with diabetes   06/20/2021 06:08 PM 5.1 4.8 - 5.6 % Final    Comment:    (NOTE)         Prediabetes:  5.7 - 6.4         Diabetes: >6.4         Glycemic control for adults with diabetes: <7.0     CBG: Recent Labs  Lab 08/11/22 1559 08/11/22 1938 08/11/22 2320 08/12/22 0329 08/12/22 0714  GLUCAP 109* 104* 90 125* 112*     Review of Systems:   No chest pain Dyspnea   Past Medical History:  She,  has a past medical history of AF (paroxysmal atrial fibrillation) ([place]) (05/29/2019), Aortic atherosclerosis ([place]) (07/05/2019), Aortic dissection ([place]) (04/04/2019), Bone spur (2008), Breast cancer (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   Surgical History:   Past Surgical History:  Procedure Laterality Date   ANTERIOR LAT LUMBAR FUSION N/A 01/18/2014   Procedure: ANTERIOR LATERAL LUMBAR FUSION LUMBAR TWO-THREE;  Surgeon: Eustace Moore,  MD;  Location: [facility];  Service: Neurosurgery;  Laterality: N/A;   ANTERIOR LUMBAR FUSION  01/18/2014   AV FISTULA PLACEMENT Left 04/20/2019   Procedure: ARTERIOVENOUS (AV) FISTULA CREATION;  Surgeon: [name], MD;  Location: [facility];  Service: Vascular;  Laterality: Left;   BACK SURGERY     BREAST LUMPECTOMY Left 01/2003   BREAST LUMPECTOMY Right 2014   BREAST LUMPECTOMY WITH NEEDLE LOCALIZATION AND AXILLARY SENTINEL LYMPH NODE BX Right 10/13/2012   Procedure: BREAST LUMPECTOMY WITH NEEDLE LOCALIZATION;  Surgeon: [name], MD;  Location: [facility];  Service: General;  Laterality: Right;  Right breast wire localized lumpectomy   INSERTION OF DIALYSIS CATHETER Right 04/20/2019   Procedure: INSERTION OF DIALYSIS CATHETER, right internal jugular;  Surgeon: [name], MD;  Location: [facility];  Service: Vascular;  Laterality: Right;   INSERTION OF DIALYSIS CATHETER Right 09/24/2020   Procedure: INSERTION OF TUNNELED DIALYSIS CATHETER;  Surgeon: Rosetta Posner, MD;  Location: [facility];  Service: Vascular;  Laterality: Right;   IR FLUORO GUIDE CV LINE RIGHT  09/22/2020   IR THORACENTESIS ASP PLEURAL SPACE W/IMG GUIDE  05/19/2019   IR US GUIDE VASC ACCESS LEFT  09/22/2020   IR US GUIDE VASC ACCESS RIGHT  09/22/2020   IR VENOCAVAGRAM SVC  09/22/2020   LAMINECTOMY  05/27/2009   Lumbar decompressive laminectomy, fusion and plating for lumbar spinal stensosis   LIGATION OF ARTERIOVENOUS  FISTULA Left 09/24/2020   Procedure: LIGATION OF LEFT ARM ARTERIOVENOUS  FISTULA;  Surgeon: Rosetta Posner, MD;  Location: [facility];  Service: Vascular;  Laterality: Left;   LUMBAR LAMINECTOMY/DECOMPRESSION MICRODISCECTOMY Left 03/23/2013   Procedure: LUMBAR LAMINECTOMY/DECOMPRESSION MICRODISCECTOMY 1 LEVEL;  Surgeon: Eustace Moore, MD;  Location: [facility];  Service: Neurosurgery;  Laterality: Left;  LUMBAR LAMINECTOMY/DECOMPRESSION MICRODISCECTOMY 1 LEVEL   MASTECTOMY, PARTIAL  Left 02/26/2003   ; S/P re-excision of cranial and lateral margins 04/19/2003.    RE-EXCISION OF BREAST CANCER,SUPERIOR MARGINS Right 10/27/2012   Procedure: RE-EXCISION OF BREAST CANCER,SUPERIOR and inferior MARGINS;  Surgeon: [name], MD;  Location: [facility];  Service: General;  Laterality: Right;  RE-EXCISION OF BREAST LUMPECTOMY Left 04/2003   TEE WITHOUT CARDIOVERSION N/A 04/04/2019   Procedure: Transesophageal Echocardiogram (Tee);  Surgeon: Wonda Olds, MD;  Location: [facility];  Service: Open Heart Surgery;  Laterality: N/A;   THORACIC AORTIC ANEURYSM REPAIR N/A 04/04/2019   Procedure: THORACIC ASCENDING ANEURYSM REPAIR (AAA)  USING 28 MM X 30 CM HEMASHIELD PLATINUM VASCULAR GRAFT;  Surgeon: Wonda Olds, MD;  Location: [facility];  Service: Open Heart Surgery;  Laterality: N/A;     Social History:   reports that she quit smoking about 2 years ago. Her smoking use included cigarettes. She has a 11.00 pack-year smoking history. She has never used smokeless tobacco. She reports that she does not currently use alcohol after a past usage of about 2.0 standard drinks of alcohol per week. She reports that she does not currently use drugs after having used the following drugs: Cocaine.   Family History:  Her family history includes Bone cancer (age of onset: 19) in her sister; Colon cancer (age of onset: 33) in her mother; Diabetes in her brother, brother, and son; Diabetes (age of onset: 43) in her sister; Hypertension in her brother, brother, mother, sister, and son; Kidney disease in her son; Multiple sclerosis in her son. There is no history of Breast cancer or Cervical cancer.   Allergies Allergies  Allergen Reactions   Shrimp [Shellfish Allergy] Shortness Of Breath   Bactroban [Mupirocin] Other (See Comments)    "Sores in nose"   Hydrocodone Itching, Nausea Only and Nausea And Vomiting   Chlorhexidine Gluconate Itching   Tylenol [Acetaminophen] Itching   Zestril [Lisinopril]  Cough     Home Medications  Prior to Admission medications   Medication Sig Start Date End Date Taking? Authorizing Provider  albuterol (VENTOLIN HFA) 108 (90 Base) MCG/ACT inhaler Inhale 2 puffs into the lungs every 6 (six) hours as needed for wheezing or shortness of breath. 07/30/20   Maximiano Coss, NP  amLODipine (NORVASC) 10 MG tablet Take 1 tablet (10 mg total) by mouth daily. 03/06/22   Setzer, Edman Circle, PA-C  apixaban (ELIQUIS) 2.5 MG TABS tablet Take 1 tablet (2.5 mg total) by mouth 2 (two) times daily. 03/06/22   Setzer, Edman Circle, PA-C  carvedilol (COREG) 25 MG tablet Take 1 tablet (25 mg total) by mouth 2 (two) times daily with a meal. 05/01/22 05/02/23  Ghimire, Henreitta Leber, MD  Cholecalciferol (VITAMIN D-3 PO) Take 1 capsule by mouth daily.    [provider]  cloNIDine (CATAPRES - DOSED IN MG/24 HR) 0.1 mg/24hr patch Place 1 patch (0.1 mg total) onto the skin every Saturday. 03/07/22   Setzer, Edman Circle, PA-C  diphenhydrAMINE-zinc acetate (BENADRYL) cream Apply topically 3 (three) times daily as needed for itching. 03/06/22   Setzer, Edman Circle, PA-C  hydrALAZINE (APRESOLINE) 25 MG tablet Take 2 tablets (50 mg total) by mouth 2 (two) times daily. 05/01/22   Ghimire, Henreitta Leber, MD  isosorbide mononitrate (IMDUR) 30 MG 24 hr tablet Take 1 tablet (30 mg total) by mouth daily. 04/20/22   Dorna Mai, MD  lidocaine (LIDODERM) 5 % Place 1 patch onto the skin daily. Remove & Discard patch within 12 hours or as directed by MD 03/07/22   Vaughan Basta, Edman Circle, PA-C  naloxone [facility]) nasal spray 4 mg/0.1 mL 1 spray as directed. 02/19/22   [provider]  oxyCODONE-acetaminophen (PERCOCET) 10-325 MG tablet Take 1 tablet by mouth 3 (three) times daily as needed. 03/12/22   [provider]  pantoprazole (PROTONIX) 40 MG tablet Take 1 tablet (40 mg total) by mouth 2 (two) times daily. 05/01/22   Ghimire, Henreitta Leber, MD  rosuvastatin (CRESTOR) 10 MG tablet TAKE 1 TABLET (10 MG TOTAL) BY  MOUTH DAILY FOR CHOLESTEROL 08/07/22   Dorna Mai, MD  sevelamer carbonate (RENVELA) 2.4 g PACK Take 2.4 g by mouth 3 (three) times daily with meals. 03/06/22   Setzer, Edman Circle, PA-C  traZODone (DESYREL) 50 MG tablet Take 0.5 tablets (25 mg total) by mouth at bedtime as needed for sleep. 04/20/22   Dorna Mai, MD     Critical care time:      Georgann Housekeeper, AGACNP-BC [place] for personal pager PCCM on call pager [phone] until 7pm. Please call Elink 7p-7a. [phone]  08/12/2022 8:41 AM

## 2022-08-12 NOTE — Progress Notes (Signed)
Pt transitioned from BiPAP to 2L [place] during HD treatment. Pt is requesting something to eat for dinner. Pt A/O x4, with clear/diminished breath sounds and in no acute distress.  Spoke to [employer] about changing diet order. Pt updated.

## 2022-08-12 NOTE — Progress Notes (Signed)
[facility] Progress Note  Name: ZOBEIDA THUMMEL MRN: [id number] DOB: 10-Nov-1950  Chief Complaint:  Shortness of breath  Subjective:  She had HD on 3/12 with 3.9 kg UF.  She came off of bipap and was transitioned to room air an hour ago.  Nitro gtt was stopped before night shift arrived.  She had a sandwich and did ok with eating. She is willing to let them pull more fluid as an outpatient if needed (previously charted as being hesitant to remove over 1.5 liters as an outpatient).    Review of systems:  Reports her breathing is much better; denies current shortness of breath  No chest pain Denies n/v Has some right sided weakness at baseline from a prior stroke  ------------  Background on consult:   ALLANI SHEHAN is a 72 y.o. female with ESRD on HD TTS at [facility].  Past medical history significant for uncontrolled HTN, AF, aortic dissection s/p repair, CAD, COPD, Hep C, Hx stroke, Hx breast cancer and history polysubstance abuse.    Patient seen and examined at bedside in ED.  Poor historian d/t current acute illness/sedation.  Able to answer yes/no questions.  Denies CP and missing dialysis.  Reports compliance with medications.  Denies abdominal pain and n/v/d.  Some history gathered from chart review:  Presented to ED for shortness of breath that started last night.  When EMS arrived was hypoxic and hypertensive and placed on re breather.  Admitted to substernal CP.  Was given sublingual nitroglycerin with some improvement.  Denied sick contacts and feeling bad prior to SOB.     Of note, chart review shows patient has been compliant with prescribed dialysis regimen.  Over the last week she has not reached her dry weight due to large gain from 3/5-3/7 with refusal to pull more than 2L due to cramping with larger goals.  Left HD on Saturday almost 3L over her dry weight.  Per provider note from 3/7 patient was out of clonidine patch with plan to get it.   Pertinent  findings in the ED include agitation, hypoxia improved w/BiPAP, hypertension with initial SBP>200, istat K 6.9, then 5.0 on CMP;  BNP >4500, troponin 57 and CXR with interstitial edema.  Patient being admitted to ICU for further evaluation and management.   Intake/Output Summary (Last 24 hours) at 08/12/2022 0550 Last data filed at 08/12/2022 0000 Gross per 24 hour  Intake 512.16 ml  Output 3900 ml  Net -3387.84 ml    Vitals:  Vitals:   08/12/22 0300 08/12/22 0340 08/12/22 0400 08/12/22 0500  BP: (!) 173/88  (!) 169/93 (!) 170/99  Pulse: 77 78 80 74  Resp: '18 17 19 19  '$ Temp:  99.5 F (37.5 C)    TempSrc:  Oral    SpO2: 100% 100% 100% 100%  Weight:      Height:         Physical Exam:  General adult female in bed in no acute distress HEENT normocephalic atraumatic extraocular movements intact sclera anicteric Neck supple trachea midline Lungs clear anteriorly bilaterally on room air with normal work of breathing at rest  Heart S1S2 no rub Abdomen soft nontender nondistended Extremities no pitting edema  Psych normal mood and affect Neuro - some dysarthria (baseline s/p stroke), alert and oriented x 3 provides hx; follows commands Access RIJ tunn catheter   Medications reviewed   Labs:     Latest Ref Rng & Units 08/12/2022    1:56 AM  08/11/2022   10:59 AM 08/11/2022    9:33 AM  BMP  Glucose 70 - 99 mg/dL 119  133  121   BUN 8 - 23 mg/dL 23   59   Creatinine 0.44 - 1.00 mg/dL 5.49   9.97   Sodium 135 - 145 mmol/L 139   142   Potassium 3.5 - 5.1 mmol/L 3.6   5.0   Chloride 98 - 111 mmol/L 99   102   CO2 22 - 32 mmol/L 29   24   Calcium 8.9 - 10.3 mg/dL 9.1   9.7      Assessment/Plan:   # Acute hypoxic resp failure  - optimize volume status with HD - s/p BIPAP and now weaned to room air/minimal oxygen   # HTN emergency  - Optimize volume status with HD - s/p nitro gtt (now weaned off) - Continue current regimen - note per charting that she has been unwilling to  remove more than net 1.5 liters each HD as an outpatient    # Hyperkalemia - improved with temporizing measures and now s/p HD with improvement   # ESRD  - HD per TTS schedule    # Anemia CKD - no current indication for ESA    # Metabolic bone disease/secondary hyperparathyroidism  - Continue hectorol - continue binders - on renvela   # COPD - note decreased respiratory reserve   # Afib - per primary team   Disposition - per primary team   Claudia Desanctis, MD 08/12/2022 6:08 AM

## 2022-08-12 NOTE — Progress Notes (Cosign Needed)
  Transition of Care [facility]) Screening Note   Patient Details  Name: Monique Henderson Date of Birth: April 15, 1951   Transition of [facility]) CM/SW Contact:    Tom-Johnson, Renea Ee, RN Phone Number: 08/12/2022, 4:30 PM  Patient is admitted for Pulmonary Edema, BP was 200/100 in ED.  Patient weaned off bipap and nitro drip, currently on room air. On Eliquis for Hx of A-fib. Has dialysis on TTS schedule. From home with Fiance, has two sons. Independent with care prior to admission. Does not drive, uses [employer] [place] transportation to and from appointments.  Has a cane, walker and grab bars at home.  PCP is Dorna Mai, MD and uses [facility].  Transition of Care Department [facility]) has reviewed patient and no TOC needs or recommendations have been identified at this time. TOC will continue to monitor patient advancement through interdisciplinary progression rounds. If new patient transition needs arise, please place a TOC consult.

## 2022-08-12 NOTE — Progress Notes (Signed)
Right lower extremity venous duplex has been completed. Preliminary results can be found in CV Proc through chart review.   08/12/22 1:59 PM Monique Henderson RVT

## 2022-08-12 NOTE — Progress Notes (Signed)
Pt receives out-pt HD at [facility] on TTS. Will assist as needed.   Lugenia Assefa Renal Navigator [phone] 

## 2022-08-13 DIAGNOSIS — J449 Chronic obstructive pulmonary disease, unspecified: Secondary | ICD-10-CM

## 2022-08-13 DIAGNOSIS — D631 Anemia in chronic kidney disease: Secondary | ICD-10-CM

## 2022-08-13 DIAGNOSIS — Z992 Dependence on renal dialysis: Secondary | ICD-10-CM

## 2022-08-13 DIAGNOSIS — Z8619 Personal history of other infectious and parasitic diseases: Secondary | ICD-10-CM

## 2022-08-13 DIAGNOSIS — I161 Hypertensive emergency: Secondary | ICD-10-CM

## 2022-08-13 DIAGNOSIS — N186 End stage renal disease: Secondary | ICD-10-CM

## 2022-08-13 DIAGNOSIS — F17200 Nicotine dependence, unspecified, uncomplicated: Secondary | ICD-10-CM

## 2022-08-13 DIAGNOSIS — N189 Chronic kidney disease, unspecified: Secondary | ICD-10-CM

## 2022-08-13 DIAGNOSIS — Z8673 Personal history of transient ischemic attack (TIA), and cerebral infarction without residual deficits: Secondary | ICD-10-CM

## 2022-08-13 DIAGNOSIS — I5022 Chronic systolic (congestive) heart failure: Secondary | ICD-10-CM | POA: Insufficient documentation

## 2022-08-13 DIAGNOSIS — Z9889 Other specified postprocedural states: Secondary | ICD-10-CM

## 2022-08-13 DIAGNOSIS — J9601 Acute respiratory failure with hypoxia: Secondary | ICD-10-CM

## 2022-08-13 DIAGNOSIS — R451 Restlessness and agitation: Secondary | ICD-10-CM | POA: Insufficient documentation

## 2022-08-13 DIAGNOSIS — I7100 Dissection of unspecified site of aorta: Secondary | ICD-10-CM

## 2022-08-13 DIAGNOSIS — I480 Paroxysmal atrial fibrillation: Secondary | ICD-10-CM

## 2022-08-13 DIAGNOSIS — Z8679 Personal history of other diseases of the circulatory system: Secondary | ICD-10-CM

## 2022-08-13 LAB — CBC
HCT: 31.1 % — ABNORMAL LOW (ref 36.0–46.0)
Hemoglobin: 9.8 g/dL — ABNORMAL LOW (ref 12.0–15.0)
MCH: 29.5 pg (ref 26.0–34.0)
MCHC: 31.5 g/dL (ref 30.0–36.0)
MCV: 93.7 fL (ref 80.0–100.0)
Platelets: 142 10*3/uL — ABNORMAL LOW (ref 150–400)
RBC: 3.32 MIL/uL — ABNORMAL LOW (ref 3.87–5.11)
RDW: 17.2 % — ABNORMAL HIGH (ref 11.5–15.5)
WBC: 4.1 10*3/uL (ref 4.0–10.5)
nRBC: 0 % (ref 0.0–0.2)

## 2022-08-13 LAB — BASIC METABOLIC PANEL
Anion gap: 15 (ref 5–15)
BUN: 50 mg/dL — ABNORMAL HIGH (ref 8–23)
CO2: 25 mmol/L (ref 22–32)
Calcium: 8.8 mg/dL — ABNORMAL LOW (ref 8.9–10.3)
Chloride: 97 mmol/L — ABNORMAL LOW (ref 98–111)
Creatinine, Ser: 8.24 mg/dL — ABNORMAL HIGH (ref 0.44–1.00)
GFR, Estimated: 5 mL/min — ABNORMAL LOW (ref >60)
Glucose, Bld: 98 mg/dL (ref 70–99)
Potassium: 4.3 mmol/L (ref 3.5–5.1)
Sodium: 137 mmol/L (ref 135–145)

## 2022-08-13 LAB — MAGNESIUM: Magnesium: 2.5 mg/dL — ABNORMAL HIGH (ref 1.7–2.4)

## 2022-08-13 LAB — HEPATITIS B SURFACE ANTIBODY, QUANTITATIVE: Hep B S AB Quant (Post): 56.3 m[IU]/mL (ref >9.9)

## 2022-08-13 MED ORDER — HYDRALAZINE HCL 50 MG PO TABS
50.0000 mg | ORAL_TABLET | Freq: Three times a day (TID) | ORAL | Status: DC
  Administered 2022-08-13 – 2022-08-14 (×3): 50 mg via ORAL
  Filled 2022-08-13 (×3): qty 1

## 2022-08-13 MED ORDER — CLONIDINE HCL 0.2 MG/24HR TD PTWK: 0.2000 mg | MEDICATED_PATCH | TRANSDERMAL | Status: DC

## 2022-08-13 MED ORDER — HEPARIN SODIUM (PORCINE) 1000 UNIT/ML IJ SOLN
INTRAMUSCULAR | Status: AC
  Administered 2022-08-13: 3800 [IU]
  Filled 2022-08-13: qty 4

## 2022-08-13 NOTE — Progress Notes (Addendum)
Received patient in bed,awake ,alert and oriented x 4.  Access used : Right HD catheter that worked well.Dressing on date.  Medicine given : Hectorol  Duration fo treatment 3.5 hours.  Fluid removed: 3.5 liters  Hemodialysis issue/comment: Last 5 minutes  of her treatment ,patient is shouting for chest cramping,UF stopped ,staggered 100 NS bolus x 2 given .Patient verbalized of having lightheaded and started to look pale . HD stopped. 300 cc blood returned from HD machine.She feels better after  after this.  Hand off to the patient's nurse.

## 2022-08-13 NOTE — Progress Notes (Addendum)
PROGRESS NOTE  Monique Henderson [id number] DOB: September 24, 1950   PCP: Dorna Mai, MD  Patient is from: Home.  Independently ambulates at baseline.  DOA: 08/11/2022 LOS: 2  Chief complaints Chief Complaint  Patient presents with   Respiratory Distress     Brief Narrative / Interim history: 72 year old F with PMH of HFmrEF, ESRD on HD, paroxysmal A-fib on Eliquis, aortic dissection s/p surgical repair, CVA, COPD, hypertension, polysubstance use and tobacco use disorder, presented with progressive dyspnea and admitted to ICU with pulmonary edema, hypertensive emergency, acute hypoxic respiratory failure and agitation.  Managed with BiPAP, nitro drip, Precedex drip and HD with ultrafiltration.  She was weaned to nasal cannula, and transferred to [facility] hospitalist service on 3/14.  Blood pressure remains elevated despite HD with ultrafiltration.   Subjective: Seen and examined earlier this morning while on dialysis.  No major events overnight of this morning.  No complaints.  She denies chest pain, dyspnea or GI symptoms.  Objective: Vitals:   08/13/22 1100 08/13/22 1133 08/13/22 1155 08/13/22 1334  BP: (!) 160/102 (!) 169/107 (!) 178/98 (!) 188/101  Pulse: 74 78 73 76  Resp: 18 16 (!) 22 (!) 21  Temp:   98.5 F (36.9 C) 98.7 F (37.1 C)  TempSrc:    Oral  SpO2: 97% 100% 98% 98%  Weight:      Height:        Examination:  GENERAL: No apparent distress.  Nontoxic. HEENT: MMM.  Vision and hearing grossly intact.  NECK: Supple.  No apparent JVD.  RESP:  No IWOB.  Fair aeration bilaterally. CVS:  RRR. Heart sounds normal.  ABD/GI/GU: BS+. Abd soft, NTND.  MSK/EXT:  Moves extremities.  Significant muscle mass and subcu fat loss. SKIN: no apparent skin lesion or wound NEURO: Awake, alert and oriented appropriately.  No apparent focal neuro deficit. PSYCH: Calm. Normal affect.   Procedures:  Hemodialysis  Microbiology summarized: [id number], influenza and RSV PCR  nonreactive MRSA PCR screen negative  Assessment and plan: Principal Problem:   Pulmonary edema, acute (HCC) Active Problems:   Hypertensive emergency   AF (paroxysmal atrial fibrillation) (HCC)   S/P aortic aneurysm repair   Tobacco use disorder   Chronic obstructive lung disease (HCC)   Aortic dissection (HCC)   History of CVA (cerebrovascular accident)   ESRD on dialysis ([place])   Acute respiratory failure with hypoxia ([place])   Anemia of renal disease   History of hepatitis C   Heart failure with mildly reduced ejection fraction (HFmrEF) (HCC)   Agitation  Acute hypoxemic respiratory failure 2/2 pulmonary edema: Resolved.  Received HD with ultrafiltration.  Net -5 L so far.  Currently on room air.   -Incentive spirometry, OOB  -HD per nephrology -Antihypertensive meds as below  Hypertensive emergency: BP 199/114 on arrival.  Heart pulmonary edema with acute respiratory failure.  Required BiPAP, nitro drip and HD with ultrafiltration.  Patient has history of aortic dissection and repair.  BP remains elevated despite ultrafiltration with HD. -Continue amlodipine, Coreg and Imdur -Increase hydralazine from 50 mg twice daily to 3 times daily -Increase clonidine patch to 0.2 mg weekly. -Continue IV labetalol as needed  ESRD on HD TTS Ischemic ATN 2/2 hypotension following TAA repair Bone mineral disorder -Per nephrology.   Acute on chronic HFmrEF: TTE in 06/20/2022 with LVEF of 45-50%) and G2-DD.  Presents with markedly elevated BP and pulmonary edema -Fluid management by HD  Paroxysmal A fib on eliquis: Rate controlled. -Continue Coreg and eliquis  History of CVA: Stable.  No focal neurosymptoms. 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  Chronic COPD: Stable.  No formal PFT.  Chronic smoker. -Continue as needed inhalers -Encouraged smoking  cessation  Anemia of renal disease: Hgb at baseline at Recent Labs    03/04/22 0705 03/05/22 1619 03/07/22 1452 04/29/22 2039 04/30/22 0621 05/06/22 1154 07/04/22 1715 08/11/22 0929 08/12/22 0156 08/13/22 0150  HGB 13.0 10.5* 10.6* 8.6* 8.7* 7.8* 12.0 13.3 10.6* 9.8*  -Monitor  Elevated troponin: No significant delta.  Likely demand ischemia and delayed clearance.   History of polysubstance Use: denies cocaine use, u tox negative   Hyperkalemia: resolved after insulin, given calcium in ED, HD  Agitation -Resolved.  Tobacco use disorder -Encourage smoking cessation.  Body mass index is 19.33 kg/m.           DVT prophylaxis:  apixaban (ELIQUIS) tablet 2.5 mg Start: 08/11/22 1700 SCDs Start: 08/11/22 1437 apixaban (ELIQUIS) tablet 2.5 mg  Code Status: Full code Family Communication: None at bedside Level of care: Telemetry Medical Status is: Inpatient Remains inpatient appropriate because: Uncontrolled hypertension   Final disposition: Home once medically stable Consultants:  Pulmonology admitted Nephrology  55 minutes with more than 50% spent in reviewing records, counseling patient/family and coordinating care.   Sch Meds:  Scheduled Meds:  amLODipine  10 mg Oral Daily   apixaban  2.5 mg Oral BID   carvedilol  25 mg Oral BID WC   [START ON 08/18/2022] cloNIDine  0.2 mg Transdermal Weekly   doxercalciferol  5 mcg Intravenous Q T,Th,Sa-HD   hydrALAZINE  50 mg Oral Q8H   isosorbide mononitrate  30 mg Oral Daily   pantoprazole  40 mg Oral BID   sevelamer carbonate  2.4 g Oral TID WC   Continuous Infusions: PRN Meds:.diphenhydrAMINE, docusate sodium, labetalol, ondansetron (ZOFRAN) IV, mouth rinse, polyethylene glycol, traZODone  Antimicrobials: Anti-infectives (From admission, onward)    None        I have personally reviewed the following labs and images: CBC: Recent Labs  Lab 08/11/22 0929 08/12/22 0156 08/13/22 0150  WBC  --  4.2  4.1  HGB 13.3 10.6* 9.8*  HCT 39.0 33.1* 31.1*  MCV  --  93.8 93.7  PLT  --  149* 142*   BMP &GFR Recent Labs  Lab 08/11/22 0929 08/11/22 0933 08/11/22 1059 08/12/22 0156 08/13/22 0150  NA 136 142  --  139 137  K 6.9* 5.0  --  3.6 4.3  CL  --  102  --  99 97*  CO2  --  24  --  29 25  GLUCOSE  --  121* 133* 119* 98  BUN  --  59*  --  23 50*  CREATININE  --  9.97*  --  5.49* 8.24*  CALCIUM  --  9.7  --  9.1 8.8*  MG  --  2.3  --  1.7 2.5*  PHOS  --   --   --  4.6  --    Estimated Creatinine Clearance: 5 mL/min (A) (by C-G formula based on SCr of 8.24 mg/dL (H)). Liver & Pancreas: Recent Labs  Lab 08/11/22 0933  AST 29  ALT 28  ALKPHOS 97  BILITOT 0.8  PROT 7.9  ALBUMIN 4.3   No results for input(s): "LIPASE", "AMYLASE" in the last 168 hours. No results for input(s): "AMMONIA"  in the last 168 hours. Diabetic: No results for input(s): "HGBA1C" in the last 72 hours. Recent Labs  Lab 08/12/22 0329 08/12/22 0714 08/12/22 1119 08/12/22 1510 08/12/22 1932  GLUCAP 125* 112* 107* 113* 119*   Cardiac Enzymes: No results for input(s): "CKTOTAL", "CKMB", "CKMBINDEX", "TROPONINI" in the last 168 hours. No results for input(s): "PROBNP" in the last 8760 hours. Coagulation Profile: No results for input(s): "INR", "PROTIME" in the last 168 hours. Thyroid Function Tests: No results for input(s): "TSH", "T4TOTAL", "FREET4", "T3FREE", "THYROIDAB" in the last 72 hours. Lipid Profile: No results for input(s): "CHOL", "HDL", "LDLCALC", "TRIG", "CHOLHDL", "LDLDIRECT" in the last 72 hours. Anemia Panel: No results for input(s): "VITAMINB12", "FOLATE", "FERRITIN", "TIBC", "IRON", "RETICCTPCT" in the last 72 hours. Urine analysis:    Component Value Date/Time   COLORURINE AMBER (A) 12/27/2021 1840   APPEARANCEUR TURBID (A) 12/27/2021 1840   LABSPEC 1.013 12/27/2021 1840   PHURINE 8.0 12/27/2021 1840   GLUCOSEU NEGATIVE 12/27/2021 1840   GLUCOSEU NEG mg/dL 10/30/2009 1944    HGBUR MODERATE (A) 12/27/2021 1840   BILIRUBINUR NEGATIVE 12/27/2021 1840   BILIRUBINUR negative 05/04/2018 0910   KETONESUR NEGATIVE 12/27/2021 1840   PROTEINUR >=300 (A) 12/27/2021 1840   UROBILINOGEN 0.2 05/04/2018 0910   UROBILINOGEN 1 02/14/2014 0946   NITRITE NEGATIVE 12/27/2021 1840   LEUKOCYTESUR LARGE (A) 12/27/2021 1840   Sepsis Labs: Invalid input(s): "PROCALCITONIN", "LACTICIDVEN"  Microbiology: Recent Results (from the past 240 hour(s))  Resp panel by RT-PCR (RSV, Flu A&B, Covid) Anterior Nasal Swab     Status: None   Collection Time: 08/11/22  9:27 AM   Specimen: Anterior Nasal Swab  Result Value Ref Range Status   SARS Coronavirus 2 by RT PCR NEGATIVE NEGATIVE Final   Influenza A by PCR NEGATIVE NEGATIVE Final   Influenza B by PCR NEGATIVE NEGATIVE Final    Comment: (NOTE) The Xpert Xpress SARS-CoV-2/FLU/RSV plus assay is intended as an aid in the diagnosis of influenza from Nasopharyngeal swab specimens and should not be used as a sole basis for treatment. Nasal washings and aspirates are unacceptable for Xpert Xpress SARS-CoV-2/FLU/RSV testing.  Fact Sheet for Patients: EntrepreneurPulse.com.au  Fact Sheet for Healthcare Providers: IncredibleEmployment.be  This test is not yet approved or cleared by the [place] FDA and has been authorized for detection and/or diagnosis of SARS-CoV-2 by FDA under an Emergency Use Authorization (EUA). This EUA will remain in effect (meaning this test can be used) for the duration of the COVID-19 declaration under Section 564(b)(1) of the Act, 21 U.S.C. section 360bbb-3(b)(1), unless the authorization is terminated or revoked.     Resp Syncytial Virus by PCR NEGATIVE NEGATIVE Final    Comment: (NOTE) Fact Sheet for Patients: EntrepreneurPulse.com.au  Fact Sheet for Healthcare Providers: IncredibleEmployment.be  This test is not yet approved or  cleared by the [place] FDA and has been authorized for detection and/or diagnosis of SARS-CoV-2 by FDA under an Emergency Use Authorization (EUA). This EUA will remain in effect (meaning this test can be used) for the duration of the COVID-19 declaration under Section 564(b)(1) of the Act, 21 U.S.C. section 360bbb-3(b)(1), unless the authorization is terminated or revoked.  Performed at [facility], [place] [address] [place], [place] [address]   MRSA Next Gen by PCR, Nasal     Status: None   Collection Time: 08/11/22  8:34 PM   Specimen: Nasal Mucosa; Nasal Swab  Result Value Ref Range Status   MRSA by PCR Next Gen NOT  DETECTED NOT DETECTED Final    Comment: (NOTE) The GeneXpert MRSA Assay (FDA approved for NASAL specimens only), is one component of a comprehensive MRSA colonization surveillance program. It is not intended to diagnose MRSA infection nor to guide or monitor treatment for MRSA infections. Test performance is not FDA approved in patients less than 56 years old. Performed at [facility], [place] [address], [place], [place] [address]     Radiology Studies: No results found.    Chesnie Capell T. [place]  If 7PM-7AM, please contact night-coverage [email or web address] 08/13/2022, 2:32 PM

## 2022-08-13 NOTE — Progress Notes (Signed)
[facility] Progress Note  Name: Monique Henderson MRN: [id number] DOB: 1950/08/08  Chief Complaint:  Shortness of breath  Subjective:  Seen and examined on dialysis.  Procedure supervised.  Blood pressure 178/103.  Tolerating goal.  RIJ tunn catheter in use.  She feels ok.    Review of systems:   Denies current shortness of breath  No chest pain Denies n/v Has some right sided weakness at baseline from a prior stroke  ------------  Background on consult:  Monique Henderson is a 72 y.o. female with ESRD on HD TTS at [facility].  Past medical history significant for uncontrolled HTN, AF, aortic dissection s/p repair, CAD, COPD, Hep C, Hx stroke, Hx breast cancer and history polysubstance abuse.   Patient seen and examined at bedside in ED.  Poor historian d/t current acute illness/sedation.  Able to answer yes/no questions.  Denies CP and missing dialysis.  Reports compliance with medications.  Denies abdominal pain and n/v/d.  Some history gathered from chart review:  Presented to ED for shortness of breath that started last night.  When EMS arrived was hypoxic and hypertensive and placed on re breather.  Admitted to substernal CP.  Was given sublingual nitroglycerin with some improvement.  Denied sick contacts and feeling bad prior to SOB.     Of note, chart review shows patient has been compliant with prescribed dialysis regimen.  Over the last week she has not reached her dry weight due to large gain from 3/5-3/7 with refusal to pull more than 2L due to cramping with larger goals.  Left HD on Saturday almost 3L over her dry weight.  Per provider note from 3/7 patient was out of clonidine patch with plan to get it.   Pertinent findings in the ED include agitation, hypoxia improved w/BiPAP, hypertension with initial SBP>200, istat K 6.9, then 5.0 on CMP;  BNP >4500, troponin 57 and CXR with interstitial edema.  Patient being admitted to ICU for further evaluation and management.    Intake/Output Summary (Last 24 hours) at 08/13/2022 0913 Last data filed at 08/12/2022 1803 Gross per 24 hour  Intake 590.06 ml  Output 50 ml  Net 540.06 ml    Vitals:  Vitals:   08/13/22 0002 08/13/22 0555 08/13/22 0839 08/13/22 0852  BP: (!) 162/98 (!) 172/94 (!) 160/94 (!) 167/92  Pulse: 76 72 73 72  Resp: 18 18 (!) 22 (!) 21  Temp: 98.8 F (37.1 C) 98.6 F (37 C) 98.2 F (36.8 C)   TempSrc: Oral Oral Oral   SpO2: 94% 95% 95% 98%  Weight:  49.5 kg    Height:         Physical Exam:   General adult female in bed in no acute distress HEENT normocephalic atraumatic extraocular movements intact sclera anicteric Neck supple trachea midline Lungs clear anteriorly bilaterally on room air with normal work of breathing at rest on room air  Heart S1S2 no rub Abdomen soft nontender nondistended Extremities no pitting edema  Psych normal mood and affect Neuro - some dysarthria (baseline s/p stroke), alert and oriented x 3 provides hx; follows commands Access RIJ tunn catheter   Medications reviewed   Labs:     Latest Ref Rng & Units 08/13/2022    1:50 AM 08/12/2022    1:56 AM 08/11/2022   10:59 AM  BMP  Glucose 70 - 99 mg/dL 98  119  133   BUN 8 - 23 mg/dL 50  23    Creatinine  0.44 - 1.00 mg/dL 8.24  5.49    Sodium 135 - 145 mmol/L 137  139    Potassium 3.5 - 5.1 mmol/L 4.3  3.6    Chloride 98 - 111 mmol/L 97  99    CO2 22 - 32 mmol/L 25  29    Calcium 8.9 - 10.3 mg/dL 8.8  9.1        Dialysis Orders:  TTS - SW  3.5hrs, BFR 400, DFR AF 1.5,  EDW 48.3kg, 2K/ 2Ca   Access: TDC  Heparin none Venofer '100mg'$  qHD x10 - 10 completed Hectorol 56mg IV qHD     Assessment/Plan:   # Acute hypoxic resp failure  - optimize volume status with HD - s/p BIPAP and now weaned to room air - note per charting that she has been unwilling to remove more than 2 kg with HD as an outpatient - she is willing to increase UF goals because she doesn't want to have trouble breathing like  that again     # HTN emergency  - Optimize volume status with HD - s/p nitro gtt (off) - Continue current regimen - She is willing to allow more UF outpatient with HD as above  # Hyperkalemia - improved with HD and temporizing measures   # ESRD  - HD per TTS schedule  - she will allow more fluid removal with HD. Would lower EDW to her post weight from today if this is lower   # Anemia CKD - Hb 13.3 on admission and now 9.8 on repeat.  Hadn't required ESA here.  May need to start ESA if persists (can be done outpatient)   # Metabolic bone disease/secondary hyperparathyroidism  - Continue hectorol - continue binders - on renvela   # COPD - note decreased respiratory reserve   # Afib - per primary team   Disposition - per primary team discretion  LClaudia Desanctis MD 08/13/2022 9:13 AM

## 2022-08-14 ENCOUNTER — Ambulatory Visit: Payer: 59 | Admitting: Interventional Cardiology

## 2022-08-14 ENCOUNTER — Other Ambulatory Visit (HOSPITAL_COMMUNITY): Payer: Self-pay

## 2022-08-14 MED ORDER — HYDRALAZINE HCL 50 MG PO TABS: 75.0000 mg | ORAL_TABLET | Freq: Three times a day (TID) | ORAL | Status: DC

## 2022-08-14 MED ORDER — SENNOSIDES-DOCUSATE SODIUM 8.6-50 MG PO TABS: 1.0000 | ORAL_TABLET | Freq: Two times a day (BID) | ORAL | 0 refills | Status: DC | PRN

## 2022-08-14 MED ORDER — HYDRALAZINE HCL 25 MG PO TABS
75.0000 mg | ORAL_TABLET | Freq: Three times a day (TID) | ORAL | 1 refills | Status: DC
  Filled 2022-08-14 – 2022-10-05 (×2): qty 270, 30d supply, fill #0

## 2022-08-14 MED ORDER — CLONIDINE 0.2 MG/24HR TD PTWK
0.2000 mg | MEDICATED_PATCH | TRANSDERMAL | 1 refills | Status: DC
  Filled 2022-08-14: qty 4, 28d supply, fill #0

## 2022-08-14 NOTE — Progress Notes (Signed)
Message left to patient's sister Enid Derry to let her know that she is going home via taxi.  Idolina Primer, RN

## 2022-08-14 NOTE — Care Management Important Message (Signed)
Important Message  Patient Details  Name: Monique Henderson MRN: [id number] Date of Birth: Apr 17, 1951   Medicare Important Message Given:  Yes     [name] 08/14/2022, 8:34 AM

## 2022-08-14 NOTE — Progress Notes (Signed)
CSW received consult for taxi voucher for patient. CSW met with patient at bedside. Patient reports she doesn't have any family or friends that can provide transportation for her today. CSW provided taxi voucher to RN for patient. Patient signed rider waiver. Signed copy of rider waiver placed in patients hard chart. All questions answered. No further questions reported at this time.

## 2022-08-14 NOTE — Discharge Summary (Signed)
Physician Discharge Summary  Monique Henderson [id number] DOB: 1950-06-25 DOA: 08/11/2022  PCP: Dorna Mai, MD  Admit date: 08/11/2022 Discharge date: 08/14/2022 Admitted From: Home Disposition: Home Recommendations for Outpatient Follow-up:  Follow up with PCP in 1 to 2 weeks Check blood pressure and CBC at follow-up Please follow up on the following pending results: None  Home Health: Not indicated Equipment/Devices: Not indicated  Discharge Condition: Stable CODE STATUS: Full code  Follow-up Information     Dorna Mai, MD. Schedule an appointment as soon as possible for a visit in 1 week(s).   Specialty: Family Medicine Contact information: [place] [address] [phone]                 Hospital course 72 year old F with PMH of HFmrEF, ESRD on HD, paroxysmal A-fib on Eliquis, aortic dissection s/p surgical repair, CVA, COPD, hypertension, polysubstance use and tobacco use disorder, presented with progressive dyspnea and admitted to ICU with pulmonary edema, hypertensive emergency, acute hypoxic respiratory failure and agitation.  Managed with BiPAP, nitro drip, Precedex drip and HD with ultrafiltration.  She was weaned to nasal cannula, and transferred to [facility] service on 3/14.   Blood pressure improved after HD with ultrafiltration and adjustment of home antihypertensive meds.  Respiratory failure resolved.  Encourage compliance and reassess blood pressure at follow-up.  Nephrology planning to increase ultrafiltration at outpatient dialysis.  See individual problem list below for more.   Problems addressed during this hospitalization Principal Problem:   Pulmonary edema, acute (HCC) Active Problems:   Hypertensive emergency   AF (paroxysmal atrial fibrillation) (HCC)   S/P aortic aneurysm repair   Tobacco use disorder   Chronic obstructive lung disease (HCC)   Aortic dissection (HCC)   History of CVA  (cerebrovascular accident)   ESRD on dialysis ([place])   Acute respiratory failure with hypoxia (HCC)   Anemia of renal disease   History of hepatitis C   Heart failure with mildly reduced ejection fraction (HFmrEF) (HCC)   Agitation   Acute hypoxemic respiratory failure 2/2 pulmonary edema: Resolved.  Received HD with ultrafiltration.  Net -5 L so far.  Currently on room air.   -Nephrology planning increased ultrafiltration with outpatient HD. -Adjusted antihypertensive meds.   Hypertensive emergency: BP 199/114 on arrival.  Had pulmonary edema with acute respiratory failure.  Required BiPAP, nitro drip and HD with ultrafiltration.  Given history of thoracic arctic dissection, it is very important to have BP under good control. -Continue amlodipine, Coreg and Imdur -Increased hydralazine to 75 mg 3 times daily.  She was on 50 mg twice daily prior to admission -Increased clonidine patch to 0.2 mg weekly.  She was on 0.1 mg weekly. -Emphasized the importance of compliance with medication -Nephrology planning increased ultrafiltration with outpatient HD   ESRD on HD TTS Ischemic ATN 2/2 hypotension following TAA repair Bone mineral disorder -Per nephrology.   Acute on chronic HFmrEF: TTE in 06/20/2022 with LVEF of 45-50%) and G2-DD.  Presents with markedly elevated BP and pulmonary edema -Fluid management by HD   Paroxysmal A fib on eliquis: Rate controlled. -Continue Coreg and eliquis   History of CVA: Stable.  No focal neurosymptoms. 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   Chronic COPD: Stable.  No formal PFT.  Chronic smoker. -Continue as needed inhalers -Encouraged smoking cessation  Anemia of renal disease: Hgb at baseline   Elevated troponin: No significant delta.  Likely demand ischemia and delayed clearance.   History of polysubstance  Use: denies cocaine use, u tox negative   Hyperkalemia: resolved after insulin, given calcium in ED, HD   Agitation -Resolved.             Vital signs Vitals:   08/14/22 0002 08/14/22 0302 08/14/22 0500 08/14/22 0846  BP: (!) 148/95 (!) 162/93  (!) 167/92  Pulse: 73 73    Temp: 99 F (37.2 C) 98.3 F (36.8 C)    Resp: 18 (!) 23    Height:      Weight:   46.2 kg   SpO2: 100% 98%    TempSrc: Oral Oral    BMI (Calculated):   18.05      Discharge exam  GENERAL: No apparent distress.  Nontoxic. HEENT: MMM.  Vision and hearing grossly intact.  NECK: Supple.  No apparent JVD.  RESP:  No IWOB.  Fair aeration bilaterally. CVS:  RRR. Heart sounds normal.  ABD/GI/GU: BS+. Abd soft, NTND.  MSK/EXT:  Moves extremities.  Significant muscle mass and subcu fat loss. SKIN: no apparent skin lesion or wound NEURO: Awake and alert. Oriented appropriately.  No apparent focal neuro deficit. PSYCH: Calm. Normal affect.  Discharge Instructions Discharge Instructions     (HEART FAILURE PATIENTS) Call MD:  Anytime you have any of the following symptoms: 1) 3 pound weight gain in 24 hours or 5 pounds in 1 week 2) shortness of breath, with or without a dry hacking cough 3) swelling in the hands, feet or stomach 4) if you have to sleep on extra pillows at night in order to breathe.   Complete by: As directed    Call MD for:  difficulty breathing, headache or visual disturbances   Complete by: As directed    Call MD for:  extreme fatigue   Complete by: As directed    Call MD for:  persistant dizziness or light-headedness   Complete by: As directed    Diet - low sodium heart healthy   Complete by: As directed    With fluid restriction to less than 1200 cc a day   Discharge instructions   Complete by: As directed    It has been a pleasure taking care of you!  You were hospitalized due to shortness of breath are markedly elevated blood pressure for which you have been treated by dialysis  and blood pressure medications.  Your symptoms and blood pressure improved.  We made some adjustment to your blood pressure medication during this hospitalization.  Please review your new medication list and the directions on your medications before you take them.  Continue dialysis.  With your primary care doctor 1 to 2 weeks or sooner if needed.   Take care,   Increase activity slowly   Complete by: As directed       Allergies as of 08/14/2022       Reactions   Shrimp [shellfish Allergy] Shortness Of Breath   Bactroban [mupirocin] Other (See Comments)   "Sores in nose"   Hydrocodone Itching, Nausea Only, Nausea And Vomiting   Chlorhexidine Gluconate Itching   Tylenol [acetaminophen] Itching   Zestril [lisinopril] Cough        Medication List     STOP taking these medications    cloNIDine 0.1 mg/24hr patch Commonly known as: CATAPRES - Dosed in mg/24 hr Replaced by: cloNIDine 0.2 mg/24hr patch  TAKE these medications    albuterol 108 (90 Base) MCG/ACT inhaler Commonly known as: VENTOLIN HFA Inhale 2 puffs into the lungs every 6 (six) hours as needed for wheezing or shortness of breath.   amLODipine 10 MG tablet Commonly known as: NORVASC Take 1 tablet (10 mg total) by mouth daily.   apixaban 2.5 MG Tabs tablet Commonly known as: ELIQUIS Take 1 tablet (2.5 mg total) by mouth 2 (two) times daily.   carvedilol 25 MG tablet Commonly known as: COREG Take 1 tablet (25 mg total) by mouth 2 (two) times daily with a meal. What changed: how much to take   cloNIDine 0.2 mg/24hr patch Commonly known as: CATAPRES - Dosed in mg/24 hr Place 1 patch (0.2 mg total) onto the skin once a week. Start taking on: August 18, 2022 Replaces: cloNIDine 0.1 mg/24hr patch   hydrALAZINE 25 MG tablet Commonly known as: APRESOLINE Take 3 tablets (75 mg total) by mouth every 8 (eight) hours. What changed:  how much to take when to take this   isosorbide mononitrate 30 MG 24 hr  tablet Commonly known as: IMDUR Take 1 tablet (30 mg total) by mouth daily.   naloxone 4 MG/0.1ML Liqd nasal spray kit Commonly known as: NARCAN 1 spray as directed.   oxyCODONE-acetaminophen 10-325 MG tablet Commonly known as: PERCOCET Take 1 tablet by mouth 3 (three) times daily as needed for pain.   pantoprazole 40 MG tablet Commonly known as: PROTONIX Take 1 tablet (40 mg total) by mouth 2 (two) times daily.   rosuvastatin 10 MG tablet Commonly known as: CRESTOR TAKE 1 TABLET (10 MG TOTAL) BY MOUTH DAILY FOR CHOLESTEROL What changed: See the new instructions.   senna-docusate 8.6-50 MG tablet Commonly known as: Senokot-S Take 1 tablet by mouth 2 (two) times daily between meals as needed for mild constipation.   sevelamer carbonate 2.4 g Pack Commonly known as: RENVELA Take 2.4 g by mouth 3 (three) times daily with meals.   traZODone 50 MG tablet Commonly known as: DESYREL Take 0.5 tablets (25 mg total) by mouth at bedtime as needed for sleep.   VITAMIN D-3 PO Take 1 capsule by mouth daily.        Consultations: Pulmonology Nephrology  Procedures/Studies:   VAS [place] UPPER EXTREMITY VENOUS DUPLEX  Result Date: 08/12/2022 UPPER VENOUS STUDY  Patient Name:  SAKEENAH DOORLEY  Date of Exam:   08/12/2022 Medical Rec #: [id number]           Accession #:    [id number] Date of Birth: 1951/01/23            Patient Gender: F Patient Age:   72 years Exam Location:  [facility] Procedure:      VAS [place] UPPER EXTREMITY VENOUS DUPLEX Referring Phys: Eddie Dibbles HOFFMAN --------------------------------------------------------------------------------  Indications: Swelling Risk Factors: None identified. Limitations: Poor ultrasound/tissue interface, line and bandages. Comparison Study: No prior studies. Performing Technologist: Oliver Hum RVT  Examination Guidelines: A complete evaluation includes B-mode imaging, spectral Doppler, color Doppler, and power Doppler as needed of all  accessible portions of each vessel. Bilateral testing is considered an integral part of a complete examination. Limited examinations for reoccurring indications may be performed as noted.  Right Findings: +----------+------------+---------+-----------+----------+-------+ RIGHT     CompressiblePhasicitySpontaneousPropertiesSummary +----------+------------+---------+-----------+----------+-------+ IJV           Full       Yes       Yes                      +----------+------------+---------+-----------+----------+-------+  Subclavian    Full       Yes       Yes                      +----------+------------+---------+-----------+----------+-------+ Axillary      Full       Yes       Yes                      +----------+------------+---------+-----------+----------+-------+ Brachial      Full       Yes       Yes                      +----------+------------+---------+-----------+----------+-------+ Radial        Full                                          +----------+------------+---------+-----------+----------+-------+ Ulnar         Full                                          +----------+------------+---------+-----------+----------+-------+ Cephalic      Full                                          +----------+------------+---------+-----------+----------+-------+ Basilic       Full                                          +----------+------------+---------+-----------+----------+-------+  Left Findings: +----------+------------+---------+-----------+----------+-------+ LEFT      CompressiblePhasicitySpontaneousPropertiesSummary +----------+------------+---------+-----------+----------+-------+ Subclavian    Full       Yes       Yes                      +----------+------------+---------+-----------+----------+-------+  Summary:  Right: No evidence of deep vein thrombosis in the upper extremity. No evidence of superficial vein thrombosis  in the upper extremity.  Left: No evidence of thrombosis in the subclavian.  *See table(s) above for measurements and observations.  Diagnosing physician: Monica Martinez MD Electronically signed by Monica Martinez MD on 08/12/2022 at 10:03:06 PM.    Final    DG Chest Portable 1 View  Result Date: 08/11/2022 CLINICAL DATA:  Shortness of breath. EXAM: PORTABLE CHEST 1 VIEW COMPARISON:  07/04/2022 FINDINGS: The right IJ dialysis catheter is stable. Stable cardiac enlargement and vascular congestion. Interstitial pulmonary edema with thickened peripheral interstitial septa (Ker[place] B-lines). No definite pleural effusions. The bony thorax is intact. IMPRESSION: Cardiac enlargement and vascular congestion with interstitial pulmonary edema. Electronically Signed   By: Marijo Sanes M.D.   On: 08/11/2022 09:55       The results of significant diagnostics from this hospitalization (including imaging, microbiology, ancillary and laboratory) are listed below for reference.     Microbiology: Recent Results (from the past 240 hour(s))  Resp panel by RT-PCR (RSV, Flu A&B, Covid) Anterior Nasal Swab     Status: None   Collection Time: 08/11/22  9:27 AM   Specimen: Anterior Nasal Swab  Result Value  Ref Range Status   SARS Coronavirus 2 by RT PCR NEGATIVE NEGATIVE Final   Influenza A by PCR NEGATIVE NEGATIVE Final   Influenza B by PCR NEGATIVE NEGATIVE Final    Comment: (NOTE) The Xpert Xpress SARS-CoV-2/FLU/RSV plus assay is intended as an aid in the diagnosis of influenza from Nasopharyngeal swab specimens and should not be used as a sole basis for treatment. Nasal washings and aspirates are unacceptable for Xpert Xpress SARS-CoV-2/FLU/RSV testing.  Fact Sheet for Patients: EntrepreneurPulse.com.au  Fact Sheet for Healthcare Providers: IncredibleEmployment.be  This test is not yet approved or cleared by the [place] FDA and has been authorized for  detection and/or diagnosis of SARS-CoV-2 by FDA under an Emergency Use Authorization (EUA). This EUA will remain in effect (meaning this test can be used) for the duration of the COVID-19 declaration under Section 564(b)(1) of the Act, 21 U.S.C. section 360bbb-3(b)(1), unless the authorization is terminated or revoked.     Resp Syncytial Virus by PCR NEGATIVE NEGATIVE Final    Comment: (NOTE) Fact Sheet for Patients: EntrepreneurPulse.com.au  Fact Sheet for Healthcare Providers: IncredibleEmployment.be  This test is not yet approved or cleared by the [place] FDA and has been authorized for detection and/or diagnosis of SARS-CoV-2 by FDA under an Emergency Use Authorization (EUA). This EUA will remain in effect (meaning this test can be used) for the duration of the COVID-19 declaration under Section 564(b)(1) of the Act, 21 U.S.C. section 360bbb-3(b)(1), unless the authorization is terminated or revoked.  Performed at [facility], [place] [address] [place], [place] [address]   MRSA Next Gen by PCR, Nasal     Status: None   Collection Time: 08/11/22  8:34 PM   Specimen: Nasal Mucosa; Nasal Swab  Result Value Ref Range Status   MRSA by PCR Next Gen NOT DETECTED NOT DETECTED Final    Comment: (NOTE) The GeneXpert MRSA Assay (FDA approved for NASAL specimens only), is one component of a comprehensive MRSA colonization surveillance program. It is not intended to diagnose MRSA infection nor to guide or monitor treatment for MRSA infections. Test performance is not FDA approved in patients less than 8 years old. Performed at [facility], [place] [address] [place], [place] [address]      Labs:  CBC: Recent Labs  Lab 08/11/22 0929 08/12/22 0156 08/13/22 0150  WBC  --  4.2 4.1  HGB 13.3 10.6* 9.8*  HCT 39.0 33.1* 31.1*  MCV  --  93.8 93.7  PLT  --  149* 142*   BMP &GFR Recent Labs  Lab 08/11/22 0929 08/11/22 0933  08/11/22 1059 08/12/22 0156 08/13/22 0150  NA 136 142  --  139 137  K 6.9* 5.0  --  3.6 4.3  CL  --  102  --  99 97*  CO2  --  24  --  29 25  GLUCOSE  --  121* 133* 119* 98  BUN  --  59*  --  23 50*  CREATININE  --  9.97*  --  5.49* 8.24*  CALCIUM  --  9.7  --  9.1 8.8*  MG  --  2.3  --  1.7 2.5*  PHOS  --   --   --  4.6  --    Estimated Creatinine Clearance: 4.7 mL/min (A) (by C-G formula based on SCr of 8.24 mg/dL (H)). Liver & Pancreas: Recent Labs  Lab 08/11/22 0933  AST 29  ALT 28  ALKPHOS 97  BILITOT 0.8  PROT 7.9  ALBUMIN 4.3   No results for input(s): "LIPASE", "AMYLASE" in the last 168 hours. No results for input(s): "AMMONIA" in the last 168 hours. Diabetic: No results for input(s): "HGBA1C" in the last 72 hours. Recent Labs  Lab 08/12/22 0329 08/12/22 0714 08/12/22 1119 08/12/22 1510 08/12/22 1932  GLUCAP 125* 112* 107* 113* 119*   Cardiac Enzymes: No results for input(s): "CKTOTAL", "CKMB", "CKMBINDEX", "TROPONINI" in the last 168 hours. No results for input(s): "PROBNP" in the last 8760 hours. Coagulation Profile: No results for input(s): "INR", "PROTIME" in the last 168 hours. Thyroid Function Tests: No results for input(s): "TSH", "T4TOTAL", "FREET4", "T3FREE", "THYROIDAB" in the last 72 hours. Lipid Profile: No results for input(s): "CHOL", "HDL", "LDLCALC", "TRIG", "CHOLHDL", "LDLDIRECT" in the last 72 hours. Anemia Panel: No results for input(s): "VITAMINB12", "FOLATE", "FERRITIN", "TIBC", "IRON", "RETICCTPCT" in the last 72 hours. Urine analysis:    Component Value Date/Time   COLORURINE AMBER (A) 12/27/2021 1840   APPEARANCEUR TURBID (A) 12/27/2021 1840   LABSPEC 1.013 12/27/2021 1840   PHURINE 8.0 12/27/2021 1840   GLUCOSEU NEGATIVE 12/27/2021 1840   GLUCOSEU NEG mg/dL 10/30/2009 1944   HGBUR MODERATE (A) 12/27/2021 1840   BILIRUBINUR NEGATIVE 12/27/2021 1840   BILIRUBINUR negative 05/04/2018 0910   KETONESUR NEGATIVE 12/27/2021 1840    PROTEINUR >=300 (A) 12/27/2021 1840   UROBILINOGEN 0.2 05/04/2018 0910   UROBILINOGEN 1 02/14/2014 0946   NITRITE NEGATIVE 12/27/2021 1840   LEUKOCYTESUR LARGE (A) 12/27/2021 1840   Sepsis Labs: Invalid input(s): "PROCALCITONIN", "LACTICIDVEN"   SIGNED:  Mercy Riding, MD  [facility] 08/14/2022, 2:11 PM

## 2022-08-14 NOTE — Progress Notes (Signed)
D/C order noted. Contacted [facility] SW to advise clinic of pt's d/c today and that pt should resume care tomorrow.   Mylan Schwarz Renal Navigator [phone] 

## 2022-08-15 ENCOUNTER — Telehealth: Payer: Self-pay | Admitting: Nurse Practitioner

## 2022-08-15 NOTE — Telephone Encounter (Signed)
Transition of care contact from inpatient facility  Date of Discharge: 08/14/2022 Date of Contact: 08/15/2022 Method of contact: Phone  Attempted to contact patient to discuss transition of care from inpatient admission. Patient did not answer the phone. Message was left on the patient's voicemail with call back number [phone].

## 2022-08-17 ENCOUNTER — Ambulatory Visit: Payer: 59 | Admitting: Occupational Therapy

## 2022-08-17 ENCOUNTER — Encounter: Payer: Self-pay | Admitting: Occupational Therapy

## 2022-08-17 ENCOUNTER — Telehealth: Payer: Self-pay

## 2022-08-17 DIAGNOSIS — M25641 Stiffness of right hand, not elsewhere classified: Secondary | ICD-10-CM

## 2022-08-17 DIAGNOSIS — R278 Other lack of coordination: Secondary | ICD-10-CM

## 2022-08-17 DIAGNOSIS — M25631 Stiffness of right wrist, not elsewhere classified: Secondary | ICD-10-CM | POA: Diagnosis present

## 2022-08-17 DIAGNOSIS — M6281 Muscle weakness (generalized): Secondary | ICD-10-CM | POA: Diagnosis present

## 2022-08-17 DIAGNOSIS — R600 Localized edema: Secondary | ICD-10-CM | POA: Diagnosis present

## 2022-08-17 DIAGNOSIS — M25611 Stiffness of right shoulder, not elsewhere classified: Secondary | ICD-10-CM | POA: Diagnosis present

## 2022-08-17 DIAGNOSIS — M25511 Pain in right shoulder: Secondary | ICD-10-CM

## 2022-08-17 DIAGNOSIS — M79601 Pain in right arm: Secondary | ICD-10-CM | POA: Diagnosis present

## 2022-08-17 DIAGNOSIS — G8191 Hemiplegia, unspecified affecting right dominant side: Secondary | ICD-10-CM

## 2022-08-17 DIAGNOSIS — M79641 Pain in right hand: Secondary | ICD-10-CM | POA: Diagnosis present

## 2022-08-17 MED ORDER — PANTOPRAZOLE SODIUM 40 MG PO TBEC: 40.0000 mg | DELAYED_RELEASE_TABLET | Freq: Two times a day (BID) | ORAL | 1 refills | Status: DC

## 2022-08-17 NOTE — Transitions of Care (Post Inpatient/ED Visit) (Signed)
   08/17/2022  Name: Monique Henderson MRN: [id number] DOB: [birth year]  Today's TOC FU Call Status: Today's TOC FU Call Status:: Successful TOC FU Call Competed TOC FU Call Complete Date: 08/17/22  Transition Care Management Follow-up Telephone Call Date of Discharge: 08/14/22 Discharge Facility[facility]) Type of Discharge: Inpatient Admission Primary Inpatient Discharge Diagnosis:: Pulmonary edema, acute How have you been since you were released from the hospital?: Better Any questions or concerns?: No  Items Reviewed: Did you receive and understand the discharge instructions provided?: Yes Medications obtained and verified?: Yes (Medications Reviewed) Any new allergies since your discharge?: No Dietary orders reviewed?: Yes Do you have support at home?: Yes  Home Care and Equipment/Supplies: [place] Ordered?: No Any new equipment or medical supplies ordered?: No  Functional Questionnaire: Do you need assistance with bathing/showering or dressing?: No Do you need assistance with meal preparation?: No Do you need assistance with eating?: No Do you have difficulty maintaining continence: No Do you need assistance with getting out of bed/getting out of a chair/moving?: No Do you have difficulty managing or taking your medications?: No  Folllow up appointments reviewed: PCP Follow-up appointment confirmed?: No (pt needs to be seen by 08-28-22- no appts - will ask front desk to call sister Freda Munro to make fu appt) Follow-up Provider: Dr Redmond Pulling [facility] Follow-up appointment confirmed?: Yes Date of Specialist follow-up appointment?: 09/22/22 Follow-Up Specialty Provider:: cardiologist Do you need transportation to your follow-up appointment?: No Do you understand care options if your condition(s) worsen?: Yes-patient verbalized understanding    Enigma LPN [place] [facility]ial [phone]

## 2022-08-17 NOTE — Therapy (Signed)
OUTPATIENT OCCUPATIONAL THERAPY NEURO Treatment  Patient Name: Monique Henderson MRN: [id number] DOB:1950-11-04, 72 y.o., female Today's Date: 08/17/2022  PCP: Dr. Redmond Pulling REFERRING PROVIDER: Durel Salts, DO  END OF SESSION:    Past Medical History:  Diagnosis Date   AF (paroxysmal atrial fibrillation) ([place]) 05/29/2019   on Coumadin   Aortic atherosclerosis ([place]) 07/05/2019   Aortic dissection ([place]) 04/04/2019   s/p repair   Bone spur 2008   Right calcaneal foot spur   Breast cancer ([place]) 2004   Ductal carcinoma in situ of the left breast; S/P left partial mastectomy 02/26/2003; S/P re-excision of cranial and lateral margins11/18/2004.radiation   Carotid artery disease (HCC)    Cerebral thrombosis with cerebral infarction 05/22/2019   Chronic HFrEF (heart failure with reduced ejection fraction) (HCC)    Chronic low back pain 06/22/2016   Chronic obstructive lung disease ([place]) 01/16/2017   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 Autumn Messing; S/P re-excision of superior and inferior margins 10/27/2012.    ESRD on hemodialysis ([place]) 05/29/2019   Essential hypertension 09/16/2006   GERD 09/16/2006   Hepatitis C    treated and RNA confirmed not detectable 01/2017   Insomnia 03/14/2015   Malnutrition of moderate degree 05/19/2019   Non compliance with medical treatment 12/04/2017   Normocytic anemia    With thrombocytosis   Osteoarthritis    Right ureteral stone 2002   S/P lumbar spinal fusion 01/18/2014   S/P lumbar decompressive laminectomy, fusion, and plating for lumbar spinal stenosis on 05/27/2009 by Dr. Eustace Moore.  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 Eustace Moore.     Stroke (cerebrum) ([place])    Tobacco use  disorder 04/19/2009   Uterine fibroid    Wears dentures    top   Past Surgical History:  Procedure Laterality Date   ANTERIOR LAT LUMBAR FUSION N/A 01/18/2014   Procedure: ANTERIOR LATERAL LUMBAR FUSION LUMBAR TWO-THREE;  Surgeon: Eustace Moore, MD;  Location: [facility] [facility];  Service: Neurosurgery;  Laterality: N/A;   ANTERIOR LUMBAR FUSION  01/18/2014   AV FISTULA PLACEMENT Left 04/20/2019   Procedure: ARTERIOVENOUS (AV) FISTULA CREATION;  Surgeon: [name], MD;  Location: [facility];  Service: Vascular;  Laterality: Left;   BACK SURGERY     BREAST LUMPECTOMY Left 01/2003   BREAST LUMPECTOMY Right 2014   BREAST LUMPECTOMY WITH NEEDLE LOCALIZATION AND AXILLARY SENTINEL LYMPH NODE BX Right 10/13/2012   Procedure: BREAST LUMPECTOMY WITH NEEDLE LOCALIZATION;  Surgeon: [name], MD;  Location: [facility];  Service: General;  Laterality: Right;  Right breast wire localized lumpectomy   INSERTION OF DIALYSIS CATHETER Right 04/20/2019   Procedure: INSERTION OF DIALYSIS CATHETER, right internal jugular;  Surgeon: [name], MD;  Location: [facility];  Service: Vascular;  Laterality: Right;   INSERTION OF DIALYSIS CATHETER Right 09/24/2020   Procedure: INSERTION OF TUNNELED DIALYSIS CATHETER;  Surgeon: Rosetta Posner, MD;  Location: [facility];  Service: Vascular;  Laterality: Right;   IR FLUORO GUIDE CV LINE RIGHT  09/22/2020   IR THORACENTESIS ASP PLEURAL SPACE W/IMG GUIDE  05/19/2019   IR US GUIDE VASC ACCESS LEFT  09/22/2020   IR US GUIDE VASC ACCESS RIGHT  09/22/2020   IR VENOCAVAGRAM SVC  09/22/2020   LAMINECTOMY  05/27/2009   Lumbar decompressive laminectomy, fusion and plating for lumbar spinal stensosis   LIGATION OF ARTERIOVENOUS  FISTULA Left 09/24/2020   Procedure: LIGATION OF LEFT ARM ARTERIOVENOUS  FISTULA;  Surgeon: Rosetta Posner, MD;  Location: [facility];  Service: Vascular;  Laterality: Left;   LUMBAR LAMINECTOMY/DECOMPRESSION MICRODISCECTOMY Left  03/23/2013   Procedure: LUMBAR LAMINECTOMY/DECOMPRESSION MICRODISCECTOMY 1 LEVEL;  Surgeon: Eustace Moore, MD;  Location: [facility];  Service: Neurosurgery;  Laterality: Left;  LUMBAR LAMINECTOMY/DECOMPRESSION MICRODISCECTOMY 1 LEVEL   MASTECTOMY, PARTIAL Left 02/26/2003   ; S/P re-excision of cranial and lateral margins 04/19/2003.    RE-EXCISION OF BREAST CANCER,SUPERIOR MARGINS Right 10/27/2012   Procedure: RE-EXCISION OF BREAST CANCER,SUPERIOR and inferior MARGINS;  Surgeon: [name], MD;  Location: [facility];  Service: General;  Laterality: Right;   RE-EXCISION OF BREAST LUMPECTOMY Left 04/2003   TEE WITHOUT CARDIOVERSION N/A 04/04/2019   Procedure: Transesophageal Echocardiogram ([place]);  Surgeon: Wonda Olds, MD;  Location: [facility];  Service: Open Heart Surgery;  Laterality: N/A;   THORACIC AORTIC ANEURYSM REPAIR N/A 04/04/2019   Procedure: THORACIC ASCENDING ANEURYSM REPAIR (AAA)  USING 28 MM X 30 CM HEMASHIELD PLATINUM VASCULAR GRAFT;  Surgeon: Wonda Olds, MD;  Location[facility];  Service: Open Heart Surgery;  Laterality: N/A;   Patient Active Problem List   Diagnosis Date Noted   Heart failure with mildly reduced ejection fraction (HFmrEF) ([place]) 08/13/2022   Agitation 08/13/2022   Pulmonary edema, acute ([place]) 08/11/2022   Right shoulder pain 06/17/2022   Edema of right upper arm 06/17/2022   Right hemiparesis ([place]) 04/06/2022   Acute cardioembolic stroke ([place]) 123456   Stroke (cerebrum) ([place]) 02/23/2022   Stroke ([place]) 02/23/2022   Hypervolemia associated with renal insufficiency 01/29/2022   Dyslipidemia 01/29/2022   DNR (do not resuscitate) 01/29/2022   Polysubstance abuse ([place]) 01/06/2022   Acute on chronic combined systolic and diastolic CHF (congestive heart failure) ([place]) 01/06/2022   Abdominal pain [id number]   Acute metabolic encephalopathy 123456   History of hepatitis C 12/08/2021   Hypertensive urgency 10/29/2021   Fluid overload 10/29/2021    Acute on chronic HFrEF (heart failure with reduced ejection fraction) ([place]) 10/29/2021   Chronic anticoagulation 10/29/2021   Prolonged QT interval 10/29/2021   Leukopenia 10/29/2021   SOB (shortness of breath) 09/15/2021   Hypertensive emergency 08/23/2021   Altered mental status    Malignant hypertension 06/19/2021   Anemia of renal disease 06/05/2021   Chronic diastolic congestive heart failure ([place]) 04/30/2021   Pure hypercholesterolemia 02/14/2021   Acute respiratory failure with hypoxia ([place]) 09/21/2020   Acute renal failure superimposed on chronic kidney disease ([place]) 09/21/2020   Community acquired pneumonia 09/21/2020   Uremia [id number]   Metabolic acidosis with increased anion gap and accumulation of organic acids 09/21/2020   Hypertensive crisis 09/21/2020   Troponin level elevated 08/28/2019   Positive D dimer 08/28/2019   Hyperkalemia 08/28/2019   SVT (supraventricular tachycardia) 08/28/2019   SIRS (systemic inflammatory response syndrome) ([place]) 08/27/2019   Paroxysmal SVT (supraventricular tachycardia) 08/27/2019   Aortic atherosclerosis ([place]) 07/05/2019   Chest pain 07/05/2019   History of CVA (cerebrovascular accident) 05/29/2019   AF (paroxysmal atrial fibrillation) ([place]) 05/29/2019   ESRD on dialysis ([place]) 05/29/2019   Cerebral thrombosis with cerebral infarction 05/22/2019   Malnutrition of moderate degree 05/19/2019   Pleural effusion 05/18/2019   Atrial fibrillation with RVR ([place]) 05/18/2019   S/P aortic aneurysm repair  04/07/2019   Aortic dissection ([place]) 04/04/2019   Dissection of aorta ([place]) 04/03/2019   Non compliance with medical treatment 12/04/2017   Chronic obstructive lung disease ([place]) 01/16/2017   Chronic low back pain 06/22/2016   Insomnia 03/14/2015   S/P lumbar spinal fusion 01/18/2014   Tobacco use disorder 04/19/2009   GERD 09/16/2006    ONSET DATE: 07/09/22- referral date  REFERRING DIAG: R60.0 (ICD-10-CM) - Edema of right upper  arm M25.511 (ICD-10-CM) - Right shoulder pain, unspecified chronicity G81.91 (ICD-10-CM) - Right hemiparesis ([place])   THERAPY DIAG:  No diagnosis found.  Rationale for Evaluation and Treatment: Rehabilitation  SUBJECTIVE:   SUBJECTIVE STATEMENT: Pt reports hand pain   PERTINENT HISTORY: She suffered a stroke on 02/22/22 to her Lt posterior-frontal parietal and anterior-parietal areas causing dysphagia, right hemiplegia, speech issues, and cognitive problems. She spent ~2 weeks in the hospital, in inpatient rehab.PMH:HTN, HLD, tachycardia, A-fib, CKD, and chest pain (other than cardiac), hx of lumbar laminectomy, breast CA, ESRD on dialysis, DM, polysubstance abuse, HEP C  PRECAUTIONS: dialysis port in chest , dialysis-Tues, Thurs Sat schedule  WEIGHT BEARING RESTRICTIONS: No  PAIN:  Are you having pain? Yes: NPRS scale: 5-6/10 Pain location: R arm, hand with use Pain description: aching  Aggravating factors: movement Relieving factors: rest  FALLS: Has patient fallen in last 6 months? No  LIVING ENVIRONMENT: Lives with: lives alone Lives in: House/apartment   PLOF: Independent  PATIENT GOALS: To use her RUE better  OBJECTIVE:   HAND DOMINANCE: Right  ADLs: Overall ADLs: mod I Transfers/ambulation related to ADLs: Eating: mod I using LUE Grooming: mod I UB Dressing: mod I LB Dressing: mod I Toileting: mod I Bathing: mod I Tub Shower transfers: mod I   IADLs: Shopping: needs assist for transportation Light housekeeping: sister helps with heavier cleaning Meal Prep: mod I with stovetop and microwave Community mobility: mod I  Medication management: Pt's sister loads box and pt takes after meds are loaded Financial management: needs assist fro transportation Handwriting: unable  MOBILITY STATUS: Independent    FUNCTIONAL OUTCOME MEASURES: Quick Dash: 81.8% disability, higher disability=greater impairment   UPPER EXTREMITY ROM:  LUE A/ROM WFLS, RUE  composite flexion/ extension : 25%  Active ROM Right eval Left eval  Shoulder flexion 45   Shoulder abduction    Shoulder adduction    Shoulder extension    Shoulder internal rotation    Shoulder external rotation    Elbow flexion 100   Elbow extension -55   Wrist flexion 45   Wrist extension 20   Wrist ulnar deviation    Wrist radial deviation    Wrist pronation WFLS   Wrist supination 60%   (Blank rows = not tested)     HAND FUNCTION: Grip strength: Right: unable   Left: NT   COORDINATION: Box and Blocks:  Right unable  Left NT  SENSATION: Appears intact  EDEMA: moderate in R hand    COGNITION: Overall cognitive status:  to be further assessed in a functional context, pt has aphasia  VISION: Subjective report: deneis visual changes   VISION ASSESSMENT: Not tested      OBSERVATIONS: Pt has remaining expressive aphasia from CVA   TODAY'S TREATMENT:  DATE:  08/17/22-Pt arrived wearing edema glove. Moderate edema present when glove was removed. Hand was washed at sink to remove dead skin and retrograde massage was performed with good results. Pt practiced as well. A/ROM, AA/ROM finger flexion /extension composite, and hook fist,  then A/ROM wrist flexion/ extension, supination pronation, and elbow flexion/extension. Closed chain shoulder flexion low range reach for floor to lap, then elbow flexion/ extension.  Pt was pain free at end of seesion  PATIENT EDUCATION: Education details:reviewed initial HEP and added retrograde massage, and finger abduct/ adduct for edema management- see pt instructions. Person educated: Patient Education method: Explanation, demonstration, handout Education comprehension: verbalized understanding, returned demonstration, verbal cues required, tactile cues required, and needs further education  HOME  EXERCISE PROGRAM: 08/10/22 beginning A/ROM HEP   GOALS: Goals reviewed with patient? No  SHORT TERM GOALS: Target date: 10/09/22  I with inial HEP Baseline:dependent Goal status: INITIAL  2.  Pt will demonstrate at least 60%  RUE composite finger flexion in prep for functional use. Baseline: 25% Goal status: INITIAL  3.  Pt will demonstrate 55* shoulder flexion in prep for functional reach. Baseline: 45* Goal status: INITIAL  4.  Pt will verbalize understanding of edema and pain reduction techniques control techniques Baseline: Pt needs reinforcement and additional education Goal status: INITIAL  5.  Pt will use RUE as a stabilizer/ gross assist at least 25% of the time for ADLS/ IADLs. Baseline: uses less than 10% Goal status: INITIAL  6.  Pt will demonstrate at least -45 R elbow extension in prep for functional reach. Baseline: -55 Goal status: INITIAL  LONG TERM GOALS: Target date: 11/01/21  I with updated HEP Baseline: dependent Goal status: INITIAL  2.  Pt will improve Quick Dash score to 76%% or better. Baseline: 81.8% Goal status: INITIAL  3.  Pt will demonstrate ability to grasp/ release a 3 inch cone with RUE. Baseline: unable Goal status: INITIAL  4.  Pt will use RUE as a stabilizer/ gross assist at least 40% of the time for ADLs. Baseline:uses less than 10%  Goal status: INITIAL  5.  Pt will demonstrate ability to touch a target with 60* RUE shoulder flexion in prep for functional reach. Baseline: 45 shoulder flexion, Goal status: INITIAL  6.  Pt will report RUE pain no greater than 6/10 for light functional use. Baseline: 10/10 with use Goal status: INITIAL  ASSESSMENT:  CLINICAL IMPRESSION: Pt is progressing towards goals with improving ROM following exercises and decreased pain.   PERFORMANCE DEFICITS: in functional skills including ADLs, IADLs, coordination, dexterity, tone, ROM, strength, pain, flexibility, Fine motor control, Gross motor  control, endurance, decreased knowledge of precautions, decreased knowledge of use of DME, and UE functional use, cognitive skills including attention, problem solving, safety awareness, thought, and understand, and psychosocial skills including coping strategies, environmental adaptation, habits, interpersonal interactions, and routines and behaviors.   IMPAIRMENTS: are limiting patient from ADLs, IADLs, rest and sleep, play, leisure, and social participation.   CO-MORBIDITIES: may have co-morbidities  that affects occupational performance. Patient will benefit from skilled OT to address above impairments and improve overall function.  MODIFICATION OR ASSISTANCE TO COMPLETE EVALUATION: No modification of tasks or assist necessary to complete an evaluation.  OT OCCUPATIONAL PROFILE AND HISTORY: Detailed assessment: Review of records and additional review of physical, cognitive, psychosocial history related to current functional performance.  CLINICAL DECISION MAKING: LOW - limited treatment options, no task modification necessary  REHAB POTENTIAL: Good  EVALUATION COMPLEXITY: Low  PLAN:  OT FREQUENCY: 2x/week  OT DURATION: 12 weeks plus eval- anticipate d/c following 6-8 weeks dependent on progress.  PLANNED INTERVENTIONS: self care/ADL training, therapeutic exercise, therapeutic activity, neuromuscular re-education, manual therapy, passive range of motion, splinting, paraffin, fluidotherapy, moist heat, cryotherapy, contrast bath, patient/family education, cognitive remediation/compensation, energy conservation, coping strategies training, DME and/or AE instructions, and Re-evaluation  RECOMMENDED OTHER SERVICES: n/a  CONSULTED AND AGREED WITH PLAN OF CARE: Patient  PLAN FOR NEXT SESSION:  edema control, gentle ROM/ NMR, retrograde massage prn   Ceara Wrightson, OT 08/17/2022, 8:46 AM

## 2022-08-17 NOTE — Patient Instructions (Signed)
Edema Reduction (Retrograde Massage)    A. Enclose tip of finger with other hand and slide toward wrist. B. Always massage toward the body in one direction only. Repeat  with lotion 5 mins Do ___2_ sessions per day.  Copyright  VHI. All rights reserved.     Edema Reduction (Pumping Exercises)    Hold hand overhead. Squeeze fingers together, making a fist. Repeat _20___ times. Spread fingers apart then press together. Repeat __20 times. Do ___3_ sessions per day.  Copyright  VHI. All rights reserved.

## 2022-08-19 ENCOUNTER — Ambulatory Visit: Payer: 59 | Admitting: Occupational Therapy

## 2022-08-24 ENCOUNTER — Encounter: Payer: Self-pay | Admitting: Occupational Therapy

## 2022-08-24 ENCOUNTER — Ambulatory Visit: Payer: 59 | Admitting: Occupational Therapy

## 2022-08-24 DIAGNOSIS — R600 Localized edema: Secondary | ICD-10-CM | POA: Diagnosis not present

## 2022-08-24 DIAGNOSIS — G8191 Hemiplegia, unspecified affecting right dominant side: Secondary | ICD-10-CM

## 2022-08-24 DIAGNOSIS — M25611 Stiffness of right shoulder, not elsewhere classified: Secondary | ICD-10-CM

## 2022-08-24 DIAGNOSIS — R278 Other lack of coordination: Secondary | ICD-10-CM

## 2022-08-24 DIAGNOSIS — M79601 Pain in right arm: Secondary | ICD-10-CM

## 2022-08-24 DIAGNOSIS — M79641 Pain in right hand: Secondary | ICD-10-CM

## 2022-08-24 DIAGNOSIS — M25631 Stiffness of right wrist, not elsewhere classified: Secondary | ICD-10-CM

## 2022-08-24 DIAGNOSIS — M25641 Stiffness of right hand, not elsewhere classified: Secondary | ICD-10-CM

## 2022-08-24 DIAGNOSIS — M25511 Pain in right shoulder: Secondary | ICD-10-CM

## 2022-08-24 DIAGNOSIS — M6281 Muscle weakness (generalized): Secondary | ICD-10-CM

## 2022-08-24 NOTE — Patient Instructions (Signed)
Warning Signs of a Stroke A stroke is a medical emergency. It should be treated right away. A stroke happens when there is not enough blood flow to the brain. A stroke can lead to brain damage and death. But if a person gets treated right away, they have a better chance of surviving and recovering. It is very important to recognize the symptoms of a stroke. What types of strokes are there? There are two main types of strokes: Ischemic stroke. This is the most common type. It happens when a blood vessel that sends blood to the brain is blocked. Hemorrhagic stroke. This happens when there is bleeding in the brain. This may be from a blood vessel leaking or bursting. A transient ischemic attack (TIA) causes the same symptoms as a stroke. But the symptoms go away quickly and do not cause lasting damage to the brain. TIAs still need to be treated right away. They are also a sign that you are at higher risk for a stroke. What are the warning signs of a stroke? The symptoms of a stroke may differ based on the part of the brain that is involved. Symptoms often happen all of a sudden. "BE FAST" symptoms "BE FAST" is an easy way to remember the main warning signs of a stroke: B - Balance. Signs are dizziness, sudden trouble walking, or loss of balance. E - Eyes. Signs are trouble seeing or a sudden change in vision. F - Face. Signs are sudden weakness or numbness of the face, or the face or eyelid drooping on one side. A - Arms. Signs are weakness or numbness in an arm. This happens suddenly and usually on one side of the body. S - Speech. Signs are sudden trouble speaking, slurred speech, or trouble understanding what people say. T - Time. Time to call emergency services. Write down what time symptoms started. A stroke may be happening even if only one "BE FAST" symptom is present. Other signs of a stroke Some less common signs of a stroke include: A sudden, severe headache with no known cause. Nausea  or vomiting. Seizure. These symptoms may be an emergency. Get help right away. Call 911. Do not wait to see if the symptoms will go away. Do not drive yourself to the hospital.  This information is not intended to replace advice given to you by your health care provider. Make sure you discuss any questions you have with your health care provider. Document Revised: 03/02/2022 Document Reviewed: 03/02/2022 Elsevier Patient Education  2023 [employer]  

## 2022-08-24 NOTE — Therapy (Signed)
OUTPATIENT OCCUPATIONAL THERAPY NEURO Treatment  Patient Name: Monique Henderson MRN: [id number] DOB:1950-12-09, 72 y.o., female Today's Date: 08/24/2022  PCP: Dr. Redmond Pulling REFERRING PROVIDER: Durel Salts, DO  END OF SESSION:  OT End of Session - 08/24/22 1313     Visit Number 3    Date for OT Re-Evaluation 11/02/22    Authorization Type [employer] Medicare    Authorization - Visit Number 3    Progress Note Due on Visit 10    OT Start Time 1315    OT Stop Time 1400    OT Time Calculation (min) 45 min    Activity Tolerance Patient tolerated treatment well    Behavior During Therapy [facility] for tasks assessed/performed              Past Medical History:  Diagnosis Date   AF (paroxysmal atrial fibrillation) ([place]) 05/29/2019   on Coumadin   Aortic atherosclerosis ([place]) 07/05/2019   Aortic dissection ([place]) 04/04/2019   s/p repair   Bone spur 2008   Right calcaneal foot spur   Breast cancer ([place]) 2004   Ductal carcinoma in situ of the left breast; S/P left partial mastectomy 02/26/2003; S/P re-excision of cranial and lateral margins11/18/2004.radiation   Carotid artery disease (HCC)    Cerebral thrombosis with cerebral infarction 05/22/2019   Chronic HFrEF (heart failure with reduced ejection fraction) (HCC)    Chronic low back pain 06/22/2016   Chronic obstructive lung disease ([place]) 01/16/2017   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 Autumn Messing; S/P re-excision of superior and inferior margins 10/27/2012.    ESRD on hemodialysis ([place]) 05/29/2019   Essential hypertension 09/16/2006   GERD 09/16/2006   Hepatitis C    treated and RNA confirmed not detectable 01/2017   Insomnia 03/14/2015   Malnutrition of moderate degree 05/19/2019   Non compliance with medical treatment 12/04/2017   Normocytic anemia    With thrombocytosis   Osteoarthritis    Right ureteral stone 2002   S/P lumbar spinal fusion 01/18/2014   S/P  lumbar decompressive laminectomy, fusion, and plating for lumbar spinal stenosis on 05/27/2009 by Dr. Eustace Moore.  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 Eustace Moore.     Stroke (cerebrum) ([place])    Tobacco use disorder 04/19/2009   Uterine fibroid    Wears dentures    top   Past Surgical History:  Procedure Laterality Date   ANTERIOR LAT LUMBAR FUSION N/A 01/18/2014   Procedure: ANTERIOR LATERAL LUMBAR FUSION LUMBAR TWO-THREE;  Surgeon: Eustace Moore, MD;  Location: [place] [facility];  Service: Neurosurgery;  Laterality: N/A;   ANTERIOR LUMBAR FUSION  01/18/2014   AV FISTULA PLACEMENT Left 04/20/2019   Procedure: ARTERIOVENOUS (AV) FISTULA CREATION;  Surgeon: [name], MD;  Location: [place];  Service: Vascular;  Laterality: Left;   BACK SURGERY     BREAST LUMPECTOMY Left 01/2003   BREAST LUMPECTOMY Right 2014   BREAST LUMPECTOMY WITH NEEDLE LOCALIZATION AND AXILLARY SENTINEL LYMPH NODE BX Right 10/13/2012   Procedure: BREAST LUMPECTOMY WITH NEEDLE LOCALIZATION;  Surgeon: [name], MD;  Location: [place];  Service: General;  Laterality: Right;  Right breast wire localized lumpectomy   INSERTION OF DIALYSIS CATHETER Right 04/20/2019   Procedure: INSERTION OF  DIALYSIS CATHETER, right internal jugular;  Surgeon: [name], MD;  Location: [place];  Service: Vascular;  Laterality: Right;   INSERTION OF DIALYSIS CATHETER Right 09/24/2020   Procedure: INSERTION OF TUNNELED DIALYSIS CATHETER;  Surgeon: Rosetta Posner, MD;  Location: [place];  Service: Vascular;  Laterality: Right;   IR FLUORO GUIDE CV LINE RIGHT  09/22/2020   IR THORACENTESIS ASP PLEURAL SPACE W/IMG GUIDE  05/19/2019   IR US GUIDE VASC ACCESS LEFT  09/22/2020   IR US GUIDE VASC ACCESS RIGHT   09/22/2020   IR VENOCAVAGRAM SVC  09/22/2020   LAMINECTOMY  05/27/2009   Lumbar decompressive laminectomy, fusion and plating for lumbar spinal stensosis   LIGATION OF ARTERIOVENOUS  FISTULA Left 09/24/2020   Procedure: LIGATION OF LEFT ARM ARTERIOVENOUS  FISTULA;  Surgeon: Rosetta Posner, MD;  Location: [place];  Service: Vascular;  Laterality: Left;   LUMBAR LAMINECTOMY/DECOMPRESSION MICRODISCECTOMY Left 03/23/2013   Procedure: LUMBAR LAMINECTOMY/DECOMPRESSION MICRODISCECTOMY 1 LEVEL;  Surgeon: Eustace Moore, MD;  Location: [place] [facility];  Service: Neurosurgery;  Laterality: Left;  LUMBAR LAMINECTOMY/DECOMPRESSION MICRODISCECTOMY 1 LEVEL   MASTECTOMY, PARTIAL Left 02/26/2003   ; S/P re-excision of cranial and lateral margins 04/19/2003.    RE-EXCISION OF BREAST CANCER,SUPERIOR MARGINS Right 10/27/2012   Procedure: RE-EXCISION OF BREAST CANCER,SUPERIOR and inferior MARGINS;  Surgeon: [name], MD;  Location: [place];  Service: General;  Laterality: Right;   RE-EXCISION OF BREAST LUMPECTOMY Left 04/2003   TEE WITHOUT CARDIOVERSION N/A 04/04/2019   Procedure: Transesophageal Echocardiogram ([place]);  Surgeon: Wonda Olds, MD;  Location: [place];  Service: Open Heart Surgery;  Laterality: N/A;   THORACIC AORTIC ANEURYSM REPAIR N/A 04/04/2019   Procedure: THORACIC ASCENDING ANEURYSM REPAIR (AAA)  USING 28 MM X 30 CM HEMASHIELD PLATINUM VASCULAR GRAFT;  Surgeon: Wonda Olds, MD;  Location: [facility];  Service: Open Heart Surgery;  Laterality: N/A;   Patient Active Problem List   Diagnosis Date Noted   Heart failure with mildly reduced ejection fraction (HFmrEF) ([place]) 08/13/2022   Agitation 08/13/2022   Pulmonary edema, acute ([place]) 08/11/2022   Right shoulder pain 06/17/2022   Edema of right upper arm 06/17/2022   Right hemiparesis ([place]) 04/06/2022   Acute cardioembolic stroke ([place]) 123456   Stroke (cerebrum) ([place]) 02/23/2022   Stroke ([place]) 02/23/2022   Hypervolemia associated with renal  insufficiency 01/29/2022   Dyslipidemia 01/29/2022   DNR (do not resuscitate) 01/29/2022   Polysubstance abuse ([place]) 01/06/2022   Acute on chronic combined systolic and diastolic CHF (congestive heart failure) ([place]) 01/06/2022   Abdominal pain [id number]   Acute metabolic encephalopathy 123456   History of hepatitis C 12/08/2021   Hypertensive urgency 10/29/2021   Fluid overload 10/29/2021   Acute on chronic HFrEF (heart failure with reduced ejection fraction) ([place]) 10/29/2021   Chronic anticoagulation 10/29/2021   Prolonged QT interval 10/29/2021   Leukopenia 10/29/2021   SOB (shortness of breath) 09/15/2021   Hypertensive emergency 08/23/2021   Altered mental status    Malignant hypertension 06/19/2021   Anemia of renal disease 06/05/2021   Chronic diastolic congestive heart failure ([place]) 04/30/2021   Pure hypercholesterolemia 02/14/2021   Acute respiratory failure with hypoxia ([place]) 09/21/2020   Acute renal failure superimposed on chronic kidney disease ([place]) 09/21/2020   Community acquired pneumonia 09/21/2020   Uremia [id number]   Metabolic acidosis with increased anion gap and accumulation of organic acids 09/21/2020   Hypertensive crisis 09/21/2020   Troponin level elevated 08/28/2019  Positive D dimer 08/28/2019   Hyperkalemia 08/28/2019   SVT (supraventricular tachycardia) 08/28/2019   SIRS (systemic inflammatory response syndrome) ([place]) 08/27/2019   Paroxysmal SVT (supraventricular tachycardia) 08/27/2019   Aortic atherosclerosis ([place]) 07/05/2019   Chest pain 07/05/2019   History of CVA (cerebrovascular accident) 05/29/2019   AF (paroxysmal atrial fibrillation) ([place]) 05/29/2019   ESRD on dialysis ([place]) 05/29/2019   Cerebral thrombosis with cerebral infarction 05/22/2019   Malnutrition of moderate degree 05/19/2019   Pleural effusion 05/18/2019   Atrial fibrillation with RVR ([employer]) 05/18/2019   S/P aortic aneurysm repair 04/07/2019   Aortic dissection ([place])  04/04/2019   Dissection of aorta ([place]) 04/03/2019   Non compliance with medical treatment 12/04/2017   Chronic obstructive lung disease ([place]) 01/16/2017   Chronic low back pain 06/22/2016   Insomnia 03/14/2015   S/P lumbar spinal fusion 01/18/2014   Tobacco use disorder 04/19/2009   GERD 09/16/2006    ONSET DATE: 07/09/22- referral date  REFERRING DIAG: R60.0 (ICD-10-CM) - Edema of right upper arm M25.511 (ICD-10-CM) - Right shoulder pain, unspecified chronicity G81.91 (ICD-10-CM) - Right hemiparesis ([place])   THERAPY DIAG:  Right shoulder pain, unspecified chronicity  Right hemiparesis (HCC)  Other lack of coordination  Muscle weakness (generalized)  Stiffness of right hand, not elsewhere classified  Localized edema  Stiffness of right wrist, not elsewhere classified  Edema of right upper arm  Stiffness of right shoulder, not elsewhere classified  Pain in right arm  Pain in right hand  Rationale for Evaluation and Treatment: Rehabilitation  SUBJECTIVE:   SUBJECTIVE STATEMENT: Pt reports hand pain   PERTINENT HISTORY: She suffered a stroke on 02/22/22 to her Lt posterior-frontal parietal and anterior-parietal areas causing dysphagia, right hemiplegia, speech issues, and cognitive problems. She spent ~2 weeks in the hospital, in inpatient rehab.PMH:HTN, HLD, tachycardia, A-fib, CKD, and chest pain (other than cardiac), hx of lumbar laminectomy, breast CA, ESRD on dialysis, DM, polysubstance abuse, HEP C  PRECAUTIONS: dialysis port in chest , dialysis-Tues, Thurs Sat schedule  WEIGHT BEARING RESTRICTIONS: No  PAIN:  Are you having pain? Yes: NPRS scale: 8/10 Pain location: R arm, hand with use Pain description: aching  Aggravating factors: movement Relieving factors: rest  FALLS: Has patient fallen in last 6 months? No  LIVING ENVIRONMENT: Lives with: lives alone Lives in: House/apartment   PLOF: Independent  PATIENT GOALS: To use her RUE  better  OBJECTIVE:   HAND DOMINANCE: Right  ADLs: Overall ADLs: mod I Transfers/ambulation related to ADLs: Eating: mod I using LUE Grooming: mod I UB Dressing: mod I LB Dressing: mod I Toileting: mod I Bathing: mod I Tub Shower transfers: mod I   IADLs: Shopping: needs assist for transportation Light housekeeping: sister helps with heavier cleaning Meal Prep: mod I with stovetop and microwave Community mobility: mod I  Medication management: Pt's sister loads box and pt takes after meds are loaded Financial management: needs assist fro transportation Handwriting: unable  MOBILITY STATUS: Independent    FUNCTIONAL OUTCOME MEASURES: Quick Dash: 81.8% disability, higher disability=greater impairment   UPPER EXTREMITY ROM:  LUE A/ROM WFLS, RUE composite flexion/ extension : 25%  Active ROM Right eval Left eval  Shoulder flexion 45   Shoulder abduction    Shoulder adduction    Shoulder extension    Shoulder internal rotation    Shoulder external rotation    Elbow flexion 100   Elbow extension -55   Wrist flexion 45   Wrist extension 20   Wrist ulnar deviation  Wrist radial deviation    Wrist pronation WFLS   Wrist supination 60%   (Blank rows = not tested)     HAND FUNCTION: Grip strength: Right: unable   Left: NT   COORDINATION: Box and Blocks:  Right unable  Left NT  SENSATION: Appears intact  EDEMA: moderate in R hand    COGNITION: Overall cognitive status:  to be further assessed in a functional context, pt has aphasia  VISION: Subjective report: deneis visual changes   VISION ASSESSMENT: Not tested      OBSERVATIONS: Pt has remaining expressive aphasia from CVA   TODAY'S TREATMENT:                                                                                                                              DATE:  08/24/22- Pt arrived wearing edema glove. Mild edema present when glove was removed.  Hotpack applied to right hand  x 5 mins, for pain and stiffness, no adverse reactions. P/ROM to digits individually and compositely, MP and PIP flexion A/ROM wrist flexion/ extension, A/ROM supination/ pronation. Supine on mat attempted shoulder flexion closed chain however pt with significant pain today Sidelying on R side while performing A/ROM elbow flexion/ extension then supination/ pronation,  Pt's sister arrived at end of session requesting pt's BP wa smonitored. BP+ 190/104. Pt was given water and a rest break. Therapist reviewed and issued CVa warning/ symptoms with pt/ sister. Pt has not taken her mid day dose of BP meds. Pt's BP after rest break was 172/97. Pt was instructed to go home and take it easy. She plans to take her BP meds when she gets home. Pt was instructed to call 911 if CVA warning signs.    08/17/22-Pt arrived wearing edema glove. Moderate edema present when glove was removed. Hand was washed at sink to remove dead skin and retrograde massage was performed with good results. Pt practiced as well. A/ROM, AA/ROM finger flexion /extension composite, and hook fist,  then A/ROM wrist flexion/ extension, supination pronation, and elbow flexion/extension. Closed chain shoulder flexion low range reach for floor to lap, then elbow flexion/ extension.  Pt was pain free at end of seesion  PATIENT EDUCATION: Education details:CVA warning signs and symptoms Person educated: Patient, sister Education method: Explanation,  handout Education comprehension: verbalized understanding  HOME EXERCISE PROGRAM: 08/10/22 beginning A/ROM HEP   GOALS: Goals reviewed with patient? No  SHORT TERM GOALS: Target date: 10/09/22  I with inial HEP Baseline:dependent Goal status: ongoing  2.  Pt will demonstrate at least 60%  RUE composite finger flexion in prep for functional use. Baseline: 25% Goal status: ongoing  3.  Pt will demonstrate 55* shoulder flexion in prep for functional reach. Baseline: 45* Goal status:  ongoing  4.  Pt will verbalize understanding of edema and pain reduction techniques control techniques Baseline: Pt needs reinforcement and additional education Goal status: ongoing  5.  Pt will use RUE as  a stabilizer/ gross assist at least 25% of the time for ADLS/ IADLs. Baseline: uses less than 10% Goal status: ongoing  6.  Pt will demonstrate at least -45 R elbow extension in prep for functional reach. Baseline: -55 Goal status: ongoing LONG TERM GOALS: Target date: 11/01/21  I with updated HEP Baseline: dependent Goal status: INITIAL  2.  Pt will improve Quick Dash score to 76%% or better. Baseline: 81.8% Goal status: INITIAL  3.  Pt will demonstrate ability to grasp/ release a 3 inch cone with RUE. Baseline: unable Goal status: INITIAL  4.  Pt will use RUE as a stabilizer/ gross assist at least 40% of the time for ADLs. Baseline:uses less than 10%  Goal status: INITIAL  5.  Pt will demonstrate ability to touch a target with 60* RUE shoulder flexion in prep for functional reach. Baseline: 45 shoulder flexion, Goal status: INITIAL  6.  Pt will report RUE pain no greater than 6/10 for light functional use. Baseline: 10/10 with use Goal status: INITIAL  ASSESSMENT:  CLINICAL IMPRESSION: Pt is progressing towards goals . She reports perfroming exercises at home. Pt's treatment today was limited by elevated BP.   PERFORMANCE DEFICITS: in functional skills including ADLs, IADLs, coordination, dexterity, tone, ROM, strength, pain, flexibility, Fine motor control, Gross motor control, endurance, decreased knowledge of precautions, decreased knowledge of use of DME, and UE functional use, cognitive skills including attention, problem solving, safety awareness, thought, and understand, and psychosocial skills including coping strategies, environmental adaptation, habits, interpersonal interactions, and routines and behaviors.   IMPAIRMENTS: are limiting patient from ADLs,  IADLs, rest and sleep, play, leisure, and social participation.   CO-MORBIDITIES: may have co-morbidities  that affects occupational performance. Patient will benefit from skilled OT to address above impairments and improve overall function.  MODIFICATION OR ASSISTANCE TO COMPLETE EVALUATION: No modification of tasks or assist necessary to complete an evaluation.  OT OCCUPATIONAL PROFILE AND HISTORY: Detailed assessment: Review of records and additional review of physical, cognitive, psychosocial history related to current functional performance.  CLINICAL DECISION MAKING: LOW - limited treatment options, no task modification necessary  REHAB POTENTIAL: Good  EVALUATION COMPLEXITY: Low    PLAN:  OT FREQUENCY: 2x/week  OT DURATION: 12 weeks plus eval- anticipate d/c following 6-8 weeks dependent on progress.  PLANNED INTERVENTIONS: self care/ADL training, therapeutic exercise, therapeutic activity, neuromuscular re-education, manual therapy, passive range of motion, splinting, paraffin, fluidotherapy, moist heat, cryotherapy, contrast bath, patient/family education, cognitive remediation/compensation, energy conservation, coping strategies training, DME and/or AE instructions, and Re-evaluation  RECOMMENDED OTHER SERVICES: n/a  CONSULTED AND AGREED WITH PLAN OF CARE: Patient  PLAN FOR NEXT SESSION:  monitor BP,edema control, gentle ROM/ NMR, retrograde massage prn   Derryck Shahan, OT 08/24/2022, 1:14 PM

## 2022-08-26 ENCOUNTER — Ambulatory Visit: Payer: 59 | Admitting: Occupational Therapy

## 2022-08-26 ENCOUNTER — Encounter: Payer: Self-pay | Admitting: Occupational Therapy

## 2022-08-26 DIAGNOSIS — M25641 Stiffness of right hand, not elsewhere classified: Secondary | ICD-10-CM

## 2022-08-26 DIAGNOSIS — M25611 Stiffness of right shoulder, not elsewhere classified: Secondary | ICD-10-CM

## 2022-08-26 DIAGNOSIS — M79601 Pain in right arm: Secondary | ICD-10-CM

## 2022-08-26 DIAGNOSIS — M79641 Pain in right hand: Secondary | ICD-10-CM

## 2022-08-26 DIAGNOSIS — M6281 Muscle weakness (generalized): Secondary | ICD-10-CM

## 2022-08-26 DIAGNOSIS — G8191 Hemiplegia, unspecified affecting right dominant side: Secondary | ICD-10-CM

## 2022-08-26 DIAGNOSIS — M25631 Stiffness of right wrist, not elsewhere classified: Secondary | ICD-10-CM

## 2022-08-26 DIAGNOSIS — R278 Other lack of coordination: Secondary | ICD-10-CM

## 2022-08-26 DIAGNOSIS — R600 Localized edema: Secondary | ICD-10-CM | POA: Diagnosis not present

## 2022-08-26 DIAGNOSIS — M25511 Pain in right shoulder: Secondary | ICD-10-CM

## 2022-08-26 NOTE — Therapy (Signed)
OUTPATIENT OCCUPATIONAL THERAPY NEURO Treatment  Patient Name: Monique Henderson MRN: [id number] DOB:[birth year], 72 y.o., female Today's Date: 08/26/2022  PCP: Dr. Redmond Pulling REFERRING PROVIDER: Durel Salts, DO  END OF SESSION:  OT End of Session - 08/26/22 1239     Visit Number 4    Number of Visits 25    Date for OT Re-Evaluation 11/02/22    Authorization Type [employer] Medicare    Authorization - Visit Number 4    Progress Note Due on Visit 10    OT Start Time [id number]    OT Stop Time 1314    OT Time Calculation (min) 44 min              Past Medical History:  Diagnosis Date   AF (paroxysmal atrial fibrillation) ([place]) 05/29/2019   on Coumadin   Aortic atherosclerosis ([place]) 07/05/2019   Aortic dissection ([place]) 04/04/2019   s/p repair   Bone spur 2008   Right calcaneal foot spur   Breast cancer ([place]) 2004   Ductal carcinoma in situ of the left breast; S/P left partial mastectomy 02/26/2003; S/P re-excision of cranial and lateral margins11/18/2004.radiation   Carotid artery disease (HCC)    Cerebral thrombosis with cerebral infarction 05/22/2019   Chronic HFrEF (heart failure with reduced ejection fraction) (HCC)    Chronic low back pain 06/22/2016   Chronic obstructive lung disease ([place]) 01/16/2017   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 Autumn Messing; S/P re-excision of superior and inferior margins 10/27/2012.    ESRD on hemodialysis ([place]) 05/29/2019   Essential hypertension 09/16/2006   GERD 09/16/2006   Hepatitis C    treated and RNA confirmed not detectable 01/2017   Insomnia 03/14/2015   Malnutrition of moderate degree 05/19/2019   Non compliance with medical treatment 12/04/2017   Normocytic anemia    With thrombocytosis   Osteoarthritis    Right ureteral stone 2002   S/P lumbar spinal fusion 01/18/2014   S/P lumbar decompressive laminectomy, fusion, and plating for lumbar spinal stenosis on 05/27/2009 by  Dr. Eustace Moore.  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 Eustace Moore.     Stroke (cerebrum) ([place])    Tobacco use disorder 04/19/2009   Uterine fibroid    Wears dentures    top   Past Surgical History:  Procedure Laterality Date   ANTERIOR LAT LUMBAR FUSION N/A 01/18/2014   Procedure: ANTERIOR LATERAL LUMBAR FUSION LUMBAR TWO-THREE;  Surgeon: Eustace Moore, MD;  Location: [facility];  Service: Neurosurgery;  Laterality: N/A;   ANTERIOR LUMBAR FUSION  01/18/2014   AV FISTULA PLACEMENT Left 04/20/2019   Procedure: ARTERIOVENOUS (AV) FISTULA CREATION;  Surgeon: [name], MD;  Location: [facility];  Service: Vascular;  Laterality: Left;   BACK SURGERY     BREAST LUMPECTOMY Left 01/2003   BREAST LUMPECTOMY Right 2014   BREAST LUMPECTOMY WITH NEEDLE LOCALIZATION AND AXILLARY SENTINEL LYMPH NODE BX Right 10/13/2012   Procedure: BREAST LUMPECTOMY WITH NEEDLE LOCALIZATION;  Surgeon: [name], MD;  Location: [facility];  Service: General;  Laterality: Right;  Right breast wire localized lumpectomy   INSERTION OF DIALYSIS CATHETER Right 04/20/2019   Procedure: INSERTION OF DIALYSIS CATHETER, right internal jugular;  Surgeon: [name], MD;  [facility];  Service: Vascular;  Laterality: Right;   INSERTION OF DIALYSIS CATHETER Right 09/24/2020   Procedure: INSERTION OF TUNNELED DIALYSIS CATHETER;  Surgeon: Rosetta Posner, MD;  Location: [facility];  Service: Vascular;  Laterality: Right;   IR FLUORO GUIDE CV LINE RIGHT  09/22/2020   IR THORACENTESIS ASP PLEURAL SPACE W/IMG GUIDE  05/19/2019   IR US GUIDE VASC ACCESS LEFT  09/22/2020   IR US GUIDE VASC ACCESS RIGHT  09/22/2020   IR VENOCAVAGRAM SVC  09/22/2020   LAMINECTOMY  05/27/2009   Lumbar decompressive  laminectomy, fusion and plating for lumbar spinal stensosis   LIGATION OF ARTERIOVENOUS  FISTULA Left 09/24/2020   Procedure: LIGATION OF LEFT ARM ARTERIOVENOUS  FISTULA;  Surgeon: Rosetta Posner, MD;  Location: [facility];  Service: Vascular;  Laterality: Left;   LUMBAR LAMINECTOMY/DECOMPRESSION MICRODISCECTOMY Left 03/23/2013   Procedure: LUMBAR LAMINECTOMY/DECOMPRESSION MICRODISCECTOMY 1 LEVEL;  Surgeon: Eustace Moore, MD;  Location: [facility];  Service: Neurosurgery;  Laterality: Left;  LUMBAR LAMINECTOMY/DECOMPRESSION MICRODISCECTOMY 1 LEVEL   MASTECTOMY, PARTIAL Left 02/26/2003   ; S/P re-excision of cranial and lateral margins 04/19/2003.    RE-EXCISION OF BREAST CANCER,SUPERIOR MARGINS Right 10/27/2012   Procedure: RE-EXCISION OF BREAST CANCER,SUPERIOR and inferior MARGINS;  Surgeon: [name], MD;  Location: [facility];  Service: General;  Laterality: Right;   RE-EXCISION OF BREAST LUMPECTOMY Left 04/2003   TEE WITHOUT CARDIOVERSION N/A 04/04/2019   Procedure: Transesophageal Echocardiogram ([place]);  Surgeon: Wonda Olds, MD;  Location: [facility];  Service: Open Heart Surgery;  Laterality: N/A;   THORACIC AORTIC ANEURYSM REPAIR N/A 04/04/2019   Procedure: THORACIC ASCENDING ANEURYSM REPAIR (AAA)  USING 28 MM X 30 CM HEMASHIELD PLATINUM VASCULAR GRAFT;  Surgeon: Wonda Olds, MD;  [facility];  Service: Open Heart Surgery;  Laterality: N/A;   Patient Active Problem List   Diagnosis Date Noted   Heart failure with mildly reduced ejection fraction (HFmrEF) ([place]) 08/13/2022   Agitation 08/13/2022   Pulmonary edema, acute ([place]) 08/11/2022   Right shoulder pain 06/17/2022   Edema of right upper arm 06/17/2022   Right hemiparesis ([place]) 04/06/2022   Acute cardioembolic stroke ([place]) [id number]   Stroke (cerebrum) ([place]) 02/23/2022   Stroke ([place]) 02/23/2022   Hypervolemia associated with renal insufficiency 01/29/2022   Dyslipidemia 01/29/2022   DNR (do not resuscitate) 01/29/2022    Polysubstance abuse ([place]) 01/06/2022   Acute on chronic combined systolic and diastolic CHF (congestive heart failure) ([place]) 01/06/2022   Abdominal pain [id number]   Acute metabolic encephalopathy 123456   History of hepatitis C 12/08/2021   Hypertensive urgency 10/29/2021   Fluid overload 10/29/2021   Acute on chronic HFrEF (heart failure with reduced ejection fraction) ([place]) 10/29/2021   Chronic anticoagulation 10/29/2021   Prolonged QT interval 10/29/2021   Leukopenia 10/29/2021   SOB (shortness of breath) 09/15/2021   Hypertensive emergency 08/23/2021   Altered mental status    Malignant hypertension 06/19/2021   Anemia of renal disease 06/05/2021   Chronic diastolic congestive heart failure ([place]) 04/30/2021   Pure hypercholesterolemia 02/14/2021   Acute respiratory failure with hypoxia ([place]) 09/21/2020   Acute renal failure superimposed on chronic kidney disease ([place] [address]) 09/21/2020   Community acquired pneumonia 09/21/2020   Uremia [id number]   Metabolic acidosis with increased anion gap and accumulation of organic acids 09/21/2020   Hypertensive crisis 09/21/2020   Troponin level elevated 08/28/2019   Positive D dimer 08/28/2019   Hyperkalemia 08/28/2019  SVT (supraventricular tachycardia) 08/28/2019   SIRS (systemic inflammatory response syndrome) (HCC) 08/27/2019   Paroxysmal SVT (supraventricular tachycardia) 08/27/2019   Aortic atherosclerosis ([place]) 07/05/2019   Chest pain 07/05/2019   History of CVA (cerebrovascular accident) 05/29/2019   AF (paroxysmal atrial fibrillation) ([place]) 05/29/2019   ESRD on dialysis ([place]) 05/29/2019   Cerebral thrombosis with cerebral infarction 05/22/2019   Malnutrition of moderate degree 05/19/2019   Pleural effusion 05/18/2019   Atrial fibrillation with RVR ([place]) 05/18/2019   S/P aortic aneurysm repair 04/07/2019   Aortic dissection ([place]) 04/04/2019   Dissection of aorta ([place]) 04/03/2019   Non compliance with medical treatment  12/04/2017   Chronic obstructive lung disease ([place]) 01/16/2017   Chronic low back pain 06/22/2016   Insomnia 03/14/2015   S/P lumbar spinal fusion 01/18/2014   Tobacco use disorder 04/19/2009   GERD 09/16/2006    ONSET DATE: 07/09/22- referral date  REFERRING DIAG: R60.0 (ICD-10-CM) - Edema of right upper arm M25.511 (ICD-10-CM) - Right shoulder pain, unspecified chronicity G81.91 (ICD-10-CM) - Right hemiparesis ([place])   THERAPY DIAG:  Right shoulder pain, unspecified chronicity  Right hemiparesis (HCC)  Muscle weakness (generalized)  Other lack of coordination  Stiffness of right hand, not elsewhere classified  Localized edema  Stiffness of right wrist, not elsewhere classified  Edema of right upper arm  Pain in right arm  Stiffness of right shoulder, not elsewhere classified  Pain in right hand  Rationale for Evaluation and Treatment: Rehabilitation  SUBJECTIVE:   SUBJECTIVE STATEMENT: Pt reports no hand pain   PERTINENT HISTORY: She suffered a stroke on 02/22/22 to her Lt posterior-frontal parietal and anterior-parietal areas causing dysphagia, right hemiplegia, speech issues, and cognitive problems. She spent ~2 weeks in the hospital, in inpatient rehab.PMH:HTN, HLD, tachycardia, A-fib, CKD, and chest pain (other than cardiac), hx of lumbar laminectomy, breast CA, ESRD on dialysis, DM, polysubstance abuse, HEP C  PRECAUTIONS: dialysis port in chest , dialysis-Tues, Thurs Sat schedule  WEIGHT BEARING RESTRICTIONS: No  PAIN: no  FALLS: Has patient fallen in last 6 months? No  LIVING ENVIRONMENT: Lives with: lives alone Lives in: House/apartment   PLOF: Independent  PATIENT GOALS: To use her RUE better  OBJECTIVE:   HAND DOMINANCE: Right  ADLs: Overall ADLs: mod I Transfers/ambulation related to ADLs: Eating: mod I using LUE Grooming: mod I UB Dressing: mod I LB Dressing: mod I Toileting: mod I Bathing: mod I Tub Shower transfers: mod  I   IADLs: Shopping: needs assist for transportation Light housekeeping: sister helps with heavier cleaning Meal Prep: mod I with stovetop and microwave Community mobility: mod I  Medication management: Pt's sister loads box and pt takes after meds are loaded Financial management: needs assist fro transportation Handwriting: unable  MOBILITY STATUS: Independent    FUNCTIONAL OUTCOME MEASURES: Quick Dash: 81.8% disability, higher disability=greater impairment   UPPER EXTREMITY ROM:  LUE A/ROM WFLS, RUE composite flexion/ extension : 25%  Active ROM Right eval Left eval  Shoulder flexion 45   Shoulder abduction    Shoulder adduction    Shoulder extension    Shoulder internal rotation    Shoulder external rotation    Elbow flexion 100   Elbow extension -55   Wrist flexion 45   Wrist extension 20   Wrist ulnar deviation    Wrist radial deviation    Wrist pronation WFLS   Wrist supination 60%   (Blank rows = not tested)     HAND FUNCTION: Grip strength: Right: unable  Left: NT   COORDINATION: Box and Blocks:  Right unable  Left NT  SENSATION: Appears intact  EDEMA: moderate in R hand    COGNITION: Overall cognitive status:  to be further assessed in a functional context, pt has aphasia  VISION: Subjective report: deneis visual changes   VISION ASSESSMENT: Not tested      OBSERVATIONS: Pt has remaining expressive aphasia from CVA   TODAY'S TREATMENT:                                                                                                                              DATE:  08/26/22- BP 162/88 Hotpack applied to R hand x 6 mins for pain and stiffness. No adverse reactions P/ROM to digits individually and compositely, followed by A/ROM finger flexion /extension A/ROM wrist flexion/ extension, A/ROM supination/ pronation. Crumpling plastic bag then paper towel  for R finger flexion/ extension Tableslides for shoulder flexion and abduction  20 reps each, min v.c Using RUE to pinch large wooden dowel pegs to remove from pegboard with min difficulty, drops replacing with RUE, however mod A for LUE for positioning.     08/24/22- Pt arrived wearing edema glove. Mild edema present when glove was removed.  Hotpack applied to right hand x 5 mins, for pain and stiffness, no adverse reactions. P/ROM to digits individually and compositely, MP and PIP flexion A/ROM wrist flexion/ extension, A/ROM supination/ pronation. Supine on mat attempted shoulder flexion closed chain however pt with significant pain today Sidelying on R side while performing A/ROM elbow flexion/ extension then supination/ pronation,  Pt's sister arrived at end of session requesting pt's BP wa smonitored. BP+ 190/104. Pt was given water and a rest break. Therapist reviewed and issued CVa warning/ symptoms with pt/ sister. Pt has not taken her mid day dose of BP meds. Pt's BP after rest break was 172/97. Pt was instructed to go home and take it easy. She plans to take her BP meds when she gets home. Pt was instructed to call 911 if CVA warning signs.    08/17/22-Pt arrived wearing edema glove. Moderate edema present when glove was removed. Hand was washed at sink to remove dead skin and retrograde massage was performed with good results. Pt practiced as well. A/ROM, AA/ROM finger flexion /extension composite, and hook fist,  then A/ROM wrist flexion/ extension, supination pronation, and elbow flexion/extension. Closed chain shoulder flexion low range reach for floor to lap, then elbow flexion/ extension.  Pt was pain free at end of seesion  PATIENT EDUCATION: Education details: updates to HEP Person educated: Patient Education method: Explanation, Demonstration, and Verbal cues Education comprehension: verbalized understanding, returned demonstration, and verbal cues required   HOME EXERCISE PROGRAM: 08/10/22 beginning A/ROM HEP   GOALS: Goals reviewed with patient?  No  SHORT TERM GOALS: Target date: 10/09/22  I with inial HEP Baseline:dependent Goal status: ongoing  2.  Pt will demonstrate at least 60%  RUE composite  finger flexion in prep for functional use. Baseline: 25% Goal status: ongoing  3.  Pt will demonstrate 55* shoulder flexion in prep for functional reach. Baseline: 45* Goal status: ongoing  4.  Pt will verbalize understanding of edema and pain reduction techniques control techniques Baseline: Pt needs reinforcement and additional education Goal status: ongoing  5.  Pt will use RUE as a stabilizer/ gross assist at least 25% of the time for ADLS/ IADLs. Baseline: uses less than 10% Goal status: ongoing  6.  Pt will demonstrate at least -45 R elbow extension in prep for functional reach. Baseline: -55 Goal status: ongoing LONG TERM GOALS: Target date: 11/01/21  I with updated HEP Baseline: dependent Goal status: INITIAL  2.  Pt will improve Quick Dash score to 76%% or better. Baseline: 81.8% Goal status: INITIAL  3.  Pt will demonstrate ability to grasp/ release a 3 inch cone with RUE. Baseline: unable Goal status: INITIAL  4.  Pt will use RUE as a stabilizer/ gross assist at least 40% of the time for ADLs. Baseline:uses less than 10%  Goal status: INITIAL  5.  Pt will demonstrate ability to touch a target with 60* RUE shoulder flexion in prep for functional reach. Baseline: 45 shoulder flexion, Goal status: INITIAL  6.  Pt will report RUE pain no greater than 6/10 for light functional use. Baseline: 10/10 with use Goal status: INITIAL  ASSESSMENT:  CLINICAL IMPRESSION: Pt is progressing towards goals . Pt demonstrates improving RUE functional use. PERFORMANCE DEFICITS: in functional skills including ADLs, IADLs, coordination, dexterity, tone, ROM, strength, pain, flexibility, Fine motor control, Gross motor control, endurance, decreased knowledge of precautions, decreased knowledge of use of DME, and UE  functional use, cognitive skills including attention, problem solving, safety awareness, thought, and understand, and psychosocial skills including coping strategies, environmental adaptation, habits, interpersonal interactions, and routines and behaviors.   IMPAIRMENTS: are limiting patient from ADLs, IADLs, rest and sleep, play, leisure, and social participation.   CO-MORBIDITIES: may have co-morbidities  that affects occupational performance. Patient will benefit from skilled OT to address above impairments and improve overall function.  MODIFICATION OR ASSISTANCE TO COMPLETE EVALUATION: No modification of tasks or assist necessary to complete an evaluation.  OT OCCUPATIONAL PROFILE AND HISTORY: Detailed assessment: Review of records and additional review of physical, cognitive, psychosocial history related to current functional performance.  CLINICAL DECISION MAKING: LOW - limited treatment options, no task modification necessary  REHAB POTENTIAL: Good  EVALUATION COMPLEXITY: Low    PLAN:  OT FREQUENCY: 2x/week  OT DURATION: 12 weeks plus eval- anticipate d/c following 6-8 weeks dependent on progress.  PLANNED INTERVENTIONS: self care/ADL training, therapeutic exercise, therapeutic activity, neuromuscular re-education, manual therapy, passive range of motion, splinting, paraffin, fluidotherapy, moist heat, cryotherapy, contrast bath, patient/family education, cognitive remediation/compensation, energy conservation, coping strategies training, DME and/or AE instructions, and Re-evaluation  RECOMMENDED OTHER SERVICES: n/a  CONSULTED AND AGREED WITH PLAN OF CARE: Patient  PLAN FOR NEXT SESSION:  monitor BP,edema control, gentle ROM/ NMR, functional use   Redell Nazir, OT 08/26/2022, 12:41 PM

## 2022-08-26 NOTE — Patient Instructions (Signed)
SHOULDER: Flexion On Table    Place hands on table, elbows straight. Push rihgt hand forwards anc back on top of a towel. Hold __5_ seconds. __20_ reps per set, __1-2_ sets per day, _7__ days per week, then repeat side to side.  Crumple a paper towel with your right hand then flatten out with your fingers 10 reps 2x day  Practice picking up small plastic lotion bottles and placing in a bowl with R hand   Copyright  VHI. All rights reserved.

## 2022-08-31 ENCOUNTER — Ambulatory Visit: Payer: 59 | Attending: Internal Medicine | Admitting: Occupational Therapy

## 2022-08-31 ENCOUNTER — Encounter: Payer: Self-pay | Admitting: Occupational Therapy

## 2022-08-31 VITALS — BP 185/107

## 2022-08-31 DIAGNOSIS — M79641 Pain in right hand: Secondary | ICD-10-CM | POA: Insufficient documentation

## 2022-08-31 DIAGNOSIS — R278 Other lack of coordination: Secondary | ICD-10-CM | POA: Insufficient documentation

## 2022-08-31 DIAGNOSIS — M25611 Stiffness of right shoulder, not elsewhere classified: Secondary | ICD-10-CM | POA: Insufficient documentation

## 2022-08-31 DIAGNOSIS — M25641 Stiffness of right hand, not elsewhere classified: Secondary | ICD-10-CM | POA: Insufficient documentation

## 2022-08-31 DIAGNOSIS — M25631 Stiffness of right wrist, not elsewhere classified: Secondary | ICD-10-CM | POA: Insufficient documentation

## 2022-08-31 DIAGNOSIS — M79601 Pain in right arm: Secondary | ICD-10-CM | POA: Insufficient documentation

## 2022-08-31 DIAGNOSIS — R600 Localized edema: Secondary | ICD-10-CM | POA: Insufficient documentation

## 2022-08-31 DIAGNOSIS — M25511 Pain in right shoulder: Secondary | ICD-10-CM | POA: Insufficient documentation

## 2022-08-31 DIAGNOSIS — M6281 Muscle weakness (generalized): Secondary | ICD-10-CM | POA: Insufficient documentation

## 2022-08-31 DIAGNOSIS — G8191 Hemiplegia, unspecified affecting right dominant side: Secondary | ICD-10-CM | POA: Insufficient documentation

## 2022-08-31 NOTE — Therapy (Signed)
OUTPATIENT OCCUPATIONAL THERAPY NEURO Treatment  Patient Name: Monique Henderson MRN: [id number] DOB:04/22/1951, 72 y.o., female Today's Date: 08/31/2022  PCP: Dr. Redmond Pulling REFERRING PROVIDER: Durel Salts, DO  END OF SESSION:     Past Medical History:  Diagnosis Date   AF (paroxysmal atrial fibrillation) ([place]) 05/29/2019   on Coumadin   Aortic atherosclerosis ([place]) 07/05/2019   Aortic dissection ([place]) 04/04/2019   s/p repair   Bone spur 2008   Right calcaneal foot spur   Breast cancer ([place]) 2004   Ductal carcinoma in situ of the left breast; S/P left partial mastectomy 02/26/2003; S/P re-excision of cranial and lateral margins11/18/2004.radiation   Carotid artery disease (HCC)    Cerebral thrombosis with cerebral infarction 05/22/2019   Chronic HFrEF (heart failure with reduced ejection fraction) (HCC)    Chronic low back pain 06/22/2016   Chronic obstructive lung disease ([place]) 01/16/2017   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 Autumn Messing; S/P re-excision of superior and inferior margins 10/27/2012.    ESRD on hemodialysis ([place]) 05/29/2019   Essential hypertension 09/16/2006   GERD 09/16/2006   Hepatitis C    treated and RNA confirmed not detectable 01/2017   Insomnia 03/14/2015   Malnutrition of moderate degree 05/19/2019   Non compliance with medical treatment 12/04/2017   Normocytic anemia    With thrombocytosis   Osteoarthritis    Right ureteral stone 2002   S/P lumbar spinal fusion 01/18/2014   S/P lumbar decompressive laminectomy, fusion, and plating for lumbar spinal stenosis on 05/27/2009 by Dr. Eustace Moore.  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 Eustace Moore.     Stroke (cerebrum) ([place])    Tobacco use  disorder 04/19/2009   Uterine fibroid    Wears dentures    top   Past Surgical History:  Procedure Laterality Date   ANTERIOR LAT LUMBAR FUSION N/A 01/18/2014   Procedure: ANTERIOR LATERAL LUMBAR FUSION LUMBAR TWO-THREE;  Surgeon: Eustace Moore, MD;  Location: [place] [facility];  Service: Neurosurgery;  Laterality: N/A;   ANTERIOR LUMBAR FUSION  01/18/2014   AV FISTULA PLACEMENT Left 04/20/2019   Procedure: ARTERIOVENOUS (AV) FISTULA CREATION;  Surgeon: [name], MD;  Location: [place];  Service: Vascular;  Laterality: Left;   BACK SURGERY     BREAST LUMPECTOMY Left 01/2003   BREAST LUMPECTOMY Right 2014   BREAST LUMPECTOMY WITH NEEDLE LOCALIZATION AND AXILLARY SENTINEL LYMPH NODE BX Right 10/13/2012   Procedure: BREAST LUMPECTOMY WITH NEEDLE LOCALIZATION;  Surgeon: [name], MD;  Location: [place];  Service: General;  Laterality: Right;  Right breast wire localized lumpectomy   INSERTION OF DIALYSIS CATHETER Right 04/20/2019   Procedure: INSERTION OF DIALYSIS CATHETER, right internal jugular;  Surgeon: [name], MD;  Location: [place];  Service: Vascular;  Laterality: Right;   INSERTION OF DIALYSIS CATHETER Right 09/24/2020   Procedure: INSERTION OF TUNNELED DIALYSIS CATHETER;  Surgeon: Rosetta Posner, MD;  Location: [place];  Service: Vascular;  Laterality: Right;   IR FLUORO GUIDE CV LINE RIGHT  09/22/2020   IR THORACENTESIS ASP PLEURAL SPACE W/IMG GUIDE  05/19/2019   IR US GUIDE VASC ACCESS LEFT  09/22/2020   IR US GUIDE VASC ACCESS RIGHT  09/22/2020   IR VENOCAVAGRAM  SVC  09/22/2020   LAMINECTOMY  05/27/2009   Lumbar decompressive laminectomy, fusion and plating for lumbar spinal stensosis   LIGATION OF ARTERIOVENOUS  FISTULA Left 09/24/2020   Procedure: LIGATION OF LEFT ARM ARTERIOVENOUS  FISTULA;  Surgeon: Rosetta Posner, MD;  Location: [place];  Service: Vascular;  Laterality: Left;   LUMBAR LAMINECTOMY/DECOMPRESSION MICRODISCECTOMY Left  03/23/2013   Procedure: LUMBAR LAMINECTOMY/DECOMPRESSION MICRODISCECTOMY 1 LEVEL;  Surgeon: Eustace Moore, MD;  Location: [place] [facility];  Service: Neurosurgery;  Laterality: Left;  LUMBAR LAMINECTOMY/DECOMPRESSION MICRODISCECTOMY 1 LEVEL   MASTECTOMY, PARTIAL Left 02/26/2003   ; S/P re-excision of cranial and lateral margins 04/19/2003.    RE-EXCISION OF BREAST CANCER,SUPERIOR MARGINS Right 10/27/2012   Procedure: RE-EXCISION OF BREAST CANCER,SUPERIOR and inferior MARGINS;  Surgeon: [name], MD;  Location: [place];  Service: General;  Laterality: Right;   RE-EXCISION OF BREAST LUMPECTOMY Left 04/2003   TEE WITHOUT CARDIOVERSION N/A 04/04/2019   Procedure: Transesophageal Echocardiogram ([facility]);  Surgeon: Wonda Olds, MD;  Location: [place];  Service: Open Heart Surgery;  Laterality: N/A;   THORACIC AORTIC ANEURYSM REPAIR N/A 04/04/2019   Procedure: THORACIC ASCENDING ANEURYSM REPAIR (AAA)  USING 28 MM X 30 CM HEMASHIELD PLATINUM VASCULAR GRAFT;  Surgeon: Wonda Olds, MD;  Location[facility];  Service: Open Heart Surgery;  Laterality: N/A;   Patient Active Problem List   Diagnosis Date Noted   Heart failure with mildly reduced ejection fraction (HFmrEF) 08/13/2022   Agitation 08/13/2022   Pulmonary edema, acute 08/11/2022   Right shoulder pain 06/17/2022   Edema of right upper arm 06/17/2022   Right hemiparesis 04/06/2022   Acute cardioembolic stroke [id number]   Stroke (cerebrum) 02/23/2022   Stroke 02/23/2022   Hypervolemia associated with renal insufficiency 01/29/2022   Dyslipidemia 01/29/2022   DNR (do not resuscitate) 01/29/2022   Polysubstance abuse 01/06/2022   Acute on chronic combined systolic and diastolic CHF (congestive heart failure) 01/06/2022   Abdominal pain [id number]   Acute metabolic encephalopathy [id number]   History of hepatitis C 12/08/2021   Hypertensive urgency 10/29/2021   Fluid overload 10/29/2021   Acute on chronic HFrEF (heart failure with reduced  ejection fraction) 10/29/2021   Chronic anticoagulation 10/29/2021   Prolonged QT interval 10/29/2021   Leukopenia 10/29/2021   SOB (shortness of breath) 09/15/2021   Hypertensive emergency 08/23/2021   Altered mental status    Malignant hypertension 06/19/2021   Anemia of renal disease 06/05/2021   Chronic diastolic congestive heart failure 04/30/2021   Pure hypercholesterolemia 02/14/2021   Acute respiratory failure with hypoxia 09/21/2020   Acute renal failure superimposed on chronic kidney disease 09/21/2020   Community acquired pneumonia 09/21/2020   Uremia 99991111   Metabolic acidosis with increased anion gap and accumulation of organic acids 09/21/2020   Hypertensive crisis 09/21/2020   Troponin level elevated 08/28/2019   Positive D dimer 08/28/2019   Hyperkalemia 08/28/2019   SVT (supraventricular tachycardia) 08/28/2019   SIRS (systemic inflammatory response syndrome) 08/27/2019   Paroxysmal SVT (supraventricular tachycardia) 08/27/2019   Aortic atherosclerosis 07/05/2019   Chest pain 07/05/2019   History of CVA (cerebrovascular accident) 05/29/2019   AF (paroxysmal atrial fibrillation) 05/29/2019   ESRD on dialysis 05/29/2019   Cerebral thrombosis with cerebral infarction 05/22/2019   Malnutrition of moderate degree 05/19/2019   Pleural effusion 05/18/2019   Atrial fibrillation with RVR 05/18/2019   S/P aortic aneurysm repair 04/07/2019   Aortic dissection 04/04/2019   Dissection of aorta 04/03/2019   Non  compliance with medical treatment 12/04/2017   Chronic obstructive lung disease 01/16/2017   Chronic low back pain 06/22/2016   Insomnia 03/14/2015   S/P lumbar spinal fusion 01/18/2014   Tobacco use disorder 04/19/2009   GERD 09/16/2006    ONSET DATE: 07/09/22- referral date  REFERRING DIAG: R60.0 (ICD-10-CM) - Edema of right upper arm M25.511 (ICD-10-CM) - Right shoulder pain, unspecified chronicity G81.91 (ICD-10-CM) - Right hemiparesis  ([place])   THERAPY DIAG:  No diagnosis found.  Rationale for Evaluation and Treatment: Rehabilitation  SUBJECTIVE:   SUBJECTIVE STATEMENT: Pt reports no hand pain   PERTINENT HISTORY: She suffered a stroke on 02/22/22 to her Lt posterior-frontal parietal and anterior-parietal areas causing dysphagia, right hemiplegia, speech issues, and cognitive problems. She spent ~2 weeks in the hospital, in inpatient rehab.PMH:HTN, HLD, tachycardia, A-fib, CKD, and chest pain (other than cardiac), hx of lumbar laminectomy, breast CA, ESRD on dialysis, DM, polysubstance abuse, HEP C  PRECAUTIONS: dialysis port in chest , dialysis-Tues, Thurs Sat schedule  WEIGHT BEARING RESTRICTIONS: No  PAIN: no  FALLS: Has patient fallen in last 6 months? No  LIVING ENVIRONMENT: Lives with: lives alone Lives in: House/apartment   PLOF: Independent  PATIENT GOALS: To use her RUE better  OBJECTIVE:   HAND DOMINANCE: Right  ADLs: Overall ADLs: mod I Transfers/ambulation related to ADLs: Eating: mod I using LUE Grooming: mod I UB Dressing: mod I LB Dressing: mod I Toileting: mod I Bathing: mod I Tub Shower transfers: mod I   IADLs: Shopping: needs assist for transportation Light housekeeping: sister helps with heavier cleaning Meal Prep: mod I with stovetop and microwave Community mobility: mod I  Medication management: Pt's sister loads box and pt takes after meds are loaded Financial management: needs assist fro transportation Handwriting: unable  MOBILITY STATUS: Independent    FUNCTIONAL OUTCOME MEASURES: Quick Dash: 81.8% disability, higher disability=greater impairment   UPPER EXTREMITY ROM:  LUE A/ROM WFLS, RUE composite flexion/ extension : 25%  Active ROM Right eval Left eval  Shoulder flexion 45   Shoulder abduction    Shoulder adduction    Shoulder extension    Shoulder internal rotation    Shoulder external rotation    Elbow flexion 100   Elbow extension -55    Wrist flexion 45   Wrist extension 20   Wrist ulnar deviation    Wrist radial deviation    Wrist pronation WFLS   Wrist supination 60%   (Blank rows = not tested)     HAND FUNCTION: Grip strength: Right: unable   Left: NT   COORDINATION: Box and Blocks:  Right unable  Left NT  SENSATION: Appears intact  EDEMA: moderate in R hand    COGNITION: Overall cognitive status:  to be further assessed in a functional context, pt has aphasia  VISION: Subjective report: deneis visual changes   VISION ASSESSMENT: Not tested      OBSERVATIONS: Pt has remaining expressive aphasia from CVA   TODAY'S TREATMENT:  DATE:  08/31/22- BP on arrival was 185/107, pt was given water and she rested. BP remained elevated 176/102. Pt's OT treatment was with held due to elevated BP. Pt's sister was present and pt was encouraged to purchase a BP cuff for use at home. Pt was instructed to go home and take it easy and to call 911 if any CVA symptoms.  08/26/22- BP 162/88 Hotpack applied to R hand x 6 mins for pain and stiffness. No adverse reactions P/ROM to digits individually and compositely, followed by A/ROM finger flexion /extension A/ROM wrist flexion/ extension, A/ROM supination/ pronation. Crumpling plastic bag then paper towel  for R finger flexion/ extension Tableslides for shoulder flexion and abduction 20 reps each, min v.c Using RUE to pinch large wooden dowel pegs to remove from pegboard with min difficulty, drops replacing with RUE, however mod A for LUE for positioning.     08/24/22- Pt arrived wearing edema glove. Mild edema present when glove was removed.  Hotpack applied to right hand x 5 mins, for pain and stiffness, no adverse reactions. P/ROM to digits individually and compositely, MP and PIP flexion A/ROM wrist flexion/ extension, A/ROM supination/  pronation. Supine on mat attempted shoulder flexion closed chain however pt with significant pain today Sidelying on R side while performing A/ROM elbow flexion/ extension then supination/ pronation,  Pt's sister arrived at end of session requesting pt's BP wa smonitored. BP+ 190/104. Pt was given water and a rest break. Therapist reviewed and issued CVa warning/ symptoms with pt/ sister. Pt has not taken her mid day dose of BP meds. Pt's BP after rest break was 172/97. Pt was instructed to go home and take it easy. She plans to take her BP meds when she gets home. Pt was instructed to call 911 if CVA warning signs.    08/17/22-Pt arrived wearing edema glove. Moderate edema present when glove was removed. Hand was washed at sink to remove dead skin and retrograde massage was performed with good results. Pt practiced as well. A/ROM, AA/ROM finger flexion /extension composite, and hook fist,  then A/ROM wrist flexion/ extension, supination pronation, and elbow flexion/extension. Closed chain shoulder flexion low range reach for floor to lap, then elbow flexion/ extension.  Pt was pain free at end of seesion  PATIENT EDUCATION: Education details: updates to HEP Person educated: Patient Education method: Explanation, Demonstration, and Verbal cues Education comprehension: verbalized understanding, returned demonstration, and verbal cues required   HOME EXERCISE PROGRAM: 08/10/22 beginning A/ROM HEP   GOALS: Goals reviewed with patient? No  SHORT TERM GOALS: Target date: 10/09/22  I with inial HEP Baseline:dependent Goal status: ongoing  2.  Pt will demonstrate at least 60%  RUE composite finger flexion in prep for functional use. Baseline: 25% Goal status: ongoing  3.  Pt will demonstrate 55* shoulder flexion in prep for functional reach. Baseline: 45* Goal status: ongoing  4.  Pt will verbalize understanding of edema and pain reduction techniques control techniques Baseline: Pt  needs reinforcement and additional education Goal status: ongoing  5.  Pt will use RUE as a stabilizer/ gross assist at least 25% of the time for ADLS/ IADLs. Baseline: uses less than 10% Goal status: ongoing  6.  Pt will demonstrate at least -45 R elbow extension in prep for functional reach. Baseline: -55 Goal status: ongoing LONG TERM GOALS: Target date: 11/01/21  I with updated HEP Baseline: dependent Goal status: INITIAL  2.  Pt will improve Quick Dash score to 76%% or  better. Baseline: 81.8% Goal status: INITIAL  3.  Pt will demonstrate ability to grasp/ release a 3 inch cone with RUE. Baseline: unable Goal status: INITIAL  4.  Pt will use RUE as a stabilizer/ gross assist at least 40% of the time for ADLs. Baseline:uses less than 10%  Goal status: INITIAL  5.  Pt will demonstrate ability to touch a target with 60* RUE shoulder flexion in prep for functional reach. Baseline: 45 shoulder flexion, Goal status: INITIAL  6.  Pt will report RUE pain no greater than 6/10 for light functional use. Baseline: 10/10 with use Goal status: INITIAL  ASSESSMENT:  CLINICAL IMPRESSION: Pt was not seen for treatment due to elevated BP. PERFORMANCE DEFICITS: in functional skills including ADLs, IADLs, coordination, dexterity, tone, ROM, strength, pain, flexibility, Fine motor control, Gross motor control, endurance, decreased knowledge of precautions, decreased knowledge of use of DME, and UE functional use, cognitive skills including attention, problem solving, safety awareness, thought, and understand, and psychosocial skills including coping strategies, environmental adaptation, habits, interpersonal interactions, and routines and behaviors.   IMPAIRMENTS: are limiting patient from ADLs, IADLs, rest and sleep, play, leisure, and social participation.   CO-MORBIDITIES: may have co-morbidities  that affects occupational performance. Patient will benefit from skilled OT to address  above impairments and improve overall function.  MODIFICATION OR ASSISTANCE TO COMPLETE EVALUATION: No modification of tasks or assist necessary to complete an evaluation.  OT OCCUPATIONAL PROFILE AND HISTORY: Detailed assessment: Review of records and additional review of physical, cognitive, psychosocial history related to current functional performance.  CLINICAL DECISION MAKING: LOW - limited treatment options, no task modification necessary  REHAB POTENTIAL: Good  EVALUATION COMPLEXITY: Low    PLAN:  OT FREQUENCY: 2x/week  OT DURATION: 12 weeks plus eval- anticipate d/c following 6-8 weeks dependent on progress.  PLANNED INTERVENTIONS: self care/ADL training, therapeutic exercise, therapeutic activity, neuromuscular re-education, manual therapy, passive range of motion, splinting, paraffin, fluidotherapy, moist heat, cryotherapy, contrast bath, patient/family education, cognitive remediation/compensation, energy conservation, coping strategies training, DME and/or AE instructions, and Re-evaluation  RECOMMENDED OTHER SERVICES: n/a  CONSULTED AND AGREED WITH PLAN OF CARE: Patient  PLAN FOR NEXT SESSION:  monitor BP,edema control, gentle ROM/ NMR, functional use   Pranay Hilbun, OT 08/31/2022, 8:31 AM

## 2022-09-02 ENCOUNTER — Ambulatory Visit: Payer: 59 | Admitting: Occupational Therapy

## 2022-09-07 ENCOUNTER — Ambulatory Visit: Payer: 59 | Admitting: Occupational Therapy

## 2022-09-07 ENCOUNTER — Encounter: Payer: Self-pay | Admitting: Occupational Therapy

## 2022-09-07 DIAGNOSIS — M79641 Pain in right hand: Secondary | ICD-10-CM

## 2022-09-07 DIAGNOSIS — M25641 Stiffness of right hand, not elsewhere classified: Secondary | ICD-10-CM

## 2022-09-07 DIAGNOSIS — R278 Other lack of coordination: Secondary | ICD-10-CM | POA: Diagnosis present

## 2022-09-07 DIAGNOSIS — M25631 Stiffness of right wrist, not elsewhere classified: Secondary | ICD-10-CM | POA: Diagnosis present

## 2022-09-07 DIAGNOSIS — M6281 Muscle weakness (generalized): Secondary | ICD-10-CM | POA: Diagnosis present

## 2022-09-07 DIAGNOSIS — M25611 Stiffness of right shoulder, not elsewhere classified: Secondary | ICD-10-CM

## 2022-09-07 DIAGNOSIS — M79601 Pain in right arm: Secondary | ICD-10-CM | POA: Diagnosis present

## 2022-09-07 DIAGNOSIS — M25511 Pain in right shoulder: Secondary | ICD-10-CM

## 2022-09-07 DIAGNOSIS — G8191 Hemiplegia, unspecified affecting right dominant side: Secondary | ICD-10-CM

## 2022-09-07 DIAGNOSIS — R600 Localized edema: Secondary | ICD-10-CM | POA: Diagnosis present

## 2022-09-07 NOTE — Therapy (Signed)
OUTPATIENT OCCUPATIONAL THERAPY NEURO Treatment  Patient Name: LEONOR MCNEFF MRN: [id number] DOB:[birth year], 72 y.o., female Today's Date: 09/07/2022  PCP: Dr. Andrey Campanile REFERRING PROVIDER: Elijah Birk, DO  END OF SESSION:  OT End of Session - 09/07/22 1040     Visit Number 5    Number of Visits 25    Date for OT Re-Evaluation 11/02/22    Authorization Type [employer]    Authorization - Visit Number 5    Progress Note Due on Visit 10    OT Start Time 1018    OT Stop Time 1102    OT Time Calculation (min) 44 min    Activity Tolerance Patient tolerated treatment well    Behavior During Therapy [facility] for tasks assessed/performed               Past Medical History:  Diagnosis Date   AF (paroxysmal atrial fibrillation) 05/29/2019   on Coumadin   Aortic atherosclerosis 07/05/2019   Aortic dissection 04/04/2019   s/p repair   Bone spur 2008   Right calcaneal foot spur   Breast cancer 2004   Ductal carcinoma in situ of the left breast; S/P left partial mastectomy 02/26/2003; S/P re-excision of cranial and lateral margins11/18/2004.radiation   Carotid artery disease    Cerebral thrombosis with cerebral infarction 05/22/2019   Chronic HFrEF (heart failure with reduced ejection fraction)    Chronic low back pain 06/22/2016   Chronic obstructive lung disease 01/16/2017   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 Chevis Pretty; S/P re-excision of superior and inferior margins 10/27/2012.    ESRD on hemodialysis 05/29/2019   Essential hypertension 09/16/2006   GERD 09/16/2006   Hepatitis C    treated and RNA confirmed not detectable 01/2017   Insomnia 03/14/2015   Malnutrition of moderate degree 05/19/2019   Non compliance with medical treatment 12/04/2017   Normocytic anemia    With thrombocytosis   Osteoarthritis    Right ureteral stone 2002   S/P lumbar spinal fusion 01/18/2014   S/P lumbar decompressive  laminectomy, fusion, and plating for lumbar spinal stenosis on 05/27/2009 by Dr. Tia Alert.  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 Tia Alert.     Stroke (cerebrum)    Tobacco use disorder 04/19/2009   Uterine fibroid    Wears dentures    top   Past Surgical History:  Procedure Laterality Date   ANTERIOR LAT LUMBAR FUSION N/A 01/18/2014   Procedure: ANTERIOR LATERAL LUMBAR FUSION LUMBAR TWO-THREE;  Surgeon: Tia Alert, MD;  Location: [facility];  Service: Neurosurgery;  Laterality: N/A;   ANTERIOR LUMBAR FUSION  01/18/2014   AV FISTULA PLACEMENT Left 04/20/2019   Procedure: ARTERIOVENOUS (AV) FISTULA CREATION;  Surgeon: Maeola Harman, MD;  Location: [facility] OR;  Service: Vascular;  Laterality: Left;   BACK SURGERY     BREAST LUMPECTOMY Left 01/2003   BREAST LUMPECTOMY Right 2014   BREAST LUMPECTOMY WITH NEEDLE LOCALIZATION AND AXILLARY SENTINEL LYMPH NODE BX Right 10/13/2012   Procedure: BREAST LUMPECTOMY WITH NEEDLE LOCALIZATION;  Surgeon: Robyne Askew, MD;  Location: [facility];  Service: General;  Laterality: Right;  Right breast wire localized lumpectomy   INSERTION OF DIALYSIS CATHETER Right 04/20/2019   Procedure: INSERTION OF DIALYSIS  CATHETER, right internal jugular;  Surgeon: Maeola Harman, MD;  Location: [facility] OR;  Service: Vascular;  Laterality: Right;   INSERTION OF DIALYSIS CATHETER Right 09/24/2020   Procedure: INSERTION OF TUNNELED DIALYSIS CATHETER;  Surgeon: Larina Earthly, MD;  Location: [facility] OR;  Service: Vascular;  Laterality: Right;   IR FLUORO GUIDE CV LINE RIGHT  09/22/2020   IR THORACENTESIS ASP PLEURAL SPACE W/IMG GUIDE  05/19/2019   IR US GUIDE VASC ACCESS LEFT  09/22/2020   IR US GUIDE VASC ACCESS RIGHT  09/22/2020   IR  VENOCAVAGRAM SVC  09/22/2020   LAMINECTOMY  05/27/2009   Lumbar decompressive laminectomy, fusion and plating for lumbar spinal stensosis   LIGATION OF ARTERIOVENOUS  FISTULA Left 09/24/2020   Procedure: LIGATION OF LEFT ARM ARTERIOVENOUS  FISTULA;  Surgeon: Larina Earthly, MD;  Location: [facility] OR;  Service: Vascular;  Laterality: Left;   LUMBAR LAMINECTOMY/DECOMPRESSION MICRODISCECTOMY Left 03/23/2013   Procedure: LUMBAR LAMINECTOMY/DECOMPRESSION MICRODISCECTOMY 1 LEVEL;  Surgeon: Tia Alert, MD;  Location: [facility];  Service: Neurosurgery;  Laterality: Left;  LUMBAR LAMINECTOMY/DECOMPRESSION MICRODISCECTOMY 1 LEVEL   MASTECTOMY, PARTIAL Left 02/26/2003   ; S/P re-excision of cranial and lateral margins 04/19/2003.    RE-EXCISION OF BREAST CANCER,SUPERIOR MARGINS Right 10/27/2012   Procedure: RE-EXCISION OF BREAST CANCER,SUPERIOR and inferior MARGINS;  Surgeon: Robyne Askew, MD;  Location: [facility] OR;  Service: General;  Laterality: Right;   RE-EXCISION OF BREAST LUMPECTOMY Left 04/2003   TEE WITHOUT CARDIOVERSION N/A 04/04/2019   Procedure: Transesophageal Echocardiogram (Tee);  Surgeon: Linden Dolin, MD;  Location: [facility] OR;  Service: Open Heart Surgery;  Laterality: N/A;   THORACIC AORTIC ANEURYSM REPAIR N/A 04/04/2019   Procedure: THORACIC ASCENDING ANEURYSM REPAIR (AAA)  USING 28 MM X 30 CM HEMASHIELD PLATINUM VASCULAR GRAFT;  Surgeon: Linden Dolin, MD;  Location: [facility] OR;  Service: Open Heart Surgery;  Laterality: N/A;   Patient Active Problem List   Diagnosis Date Noted   Heart failure with mildly reduced ejection fraction (HFmrEF) 08/13/2022   Agitation 08/13/2022   Pulmonary edema, acute 08/11/2022   Right shoulder pain 06/17/2022   Edema of right upper arm 06/17/2022   Right hemiparesis 04/06/2022   Acute cardioembolic stroke 02/26/2022   Stroke (cerebrum) 02/23/2022   Stroke 02/23/2022   Hypervolemia associated with renal insufficiency 01/29/2022   Dyslipidemia 01/29/2022    DNR (do not resuscitate) 01/29/2022   Polysubstance abuse 01/06/2022   Acute on chronic combined systolic and diastolic CHF (congestive heart failure) 01/06/2022   Abdominal pain 01/06/2022   Acute metabolic encephalopathy 12/24/2021   History of hepatitis C 12/08/2021   Hypertensive urgency 10/29/2021   Fluid overload 10/29/2021   Acute on chronic HFrEF (heart failure with reduced ejection fraction) 10/29/2021   Chronic anticoagulation 10/29/2021   Prolonged QT interval 10/29/2021   Leukopenia 10/29/2021   SOB (shortness of breath) 09/15/2021   Hypertensive emergency 08/23/2021   Altered mental status    Malignant hypertension 06/19/2021   Anemia of renal disease 06/05/2021   Chronic diastolic congestive heart failure 04/30/2021   Pure hypercholesterolemia 02/14/2021   Acute respiratory failure with hypoxia 09/21/2020   Acute renal failure superimposed on chronic kidney disease 09/21/2020   Community acquired pneumonia 09/21/2020   Uremia 09/21/2020   Metabolic acidosis with increased anion gap and accumulation of organic acids 09/21/2020   Hypertensive crisis 09/21/2020   Troponin level elevated 08/28/2019   Positive D dimer 08/28/2019   Hyperkalemia 08/28/2019   SVT (  supraventricular tachycardia) 08/28/2019   SIRS (systemic inflammatory response syndrome) 08/27/2019   Paroxysmal SVT (supraventricular tachycardia) 08/27/2019   Aortic atherosclerosis 07/05/2019   Chest pain 07/05/2019   History of CVA (cerebrovascular accident) 05/29/2019   AF (paroxysmal atrial fibrillation) 05/29/2019   ESRD on dialysis 05/29/2019   Cerebral thrombosis with cerebral infarction 05/22/2019   Malnutrition of moderate degree 05/19/2019   Pleural effusion 05/18/2019   Atrial fibrillation with RVR 05/18/2019   S/P aortic aneurysm repair 04/07/2019   Aortic dissection 04/04/2019   Dissection of aorta 04/03/2019   Non compliance with medical treatment 12/04/2017   Chronic obstructive lung  disease 01/16/2017   Chronic low back pain 06/22/2016   Insomnia 03/14/2015   S/P lumbar spinal fusion 01/18/2014   Tobacco use disorder 04/19/2009   GERD 09/16/2006    ONSET DATE: 07/09/22- referral date  REFERRING DIAG: R60.0 (ICD-10-CM) - Edema of right upper arm M25.511 (ICD-10-CM) - Right shoulder pain, unspecified chronicity G81.91 (ICD-10-CM) - Right hemiparesis (HCC)   THERAPY DIAG:  Right shoulder pain, unspecified chronicity  Muscle weakness (generalized)  Other lack of coordination  Stiffness of right hand, not elsewhere classified  Stiffness of right wrist, not elsewhere classified  Stiffness of right shoulder, not elsewhere classified  Pain in right hand  Pain in right arm  Rationale for Evaluation and Treatment: Rehabilitation  SUBJECTIVE:   SUBJECTIVE STATEMENT: Pt reports she had a good birthday   PERTINENT HISTORY: She suffered a stroke on 02/22/22 to her Lt posterior-frontal parietal and anterior-parietal areas causing dysphagia, right hemiplegia, speech issues, and cognitive problems. She spent ~2 weeks in the hospital, in inpatient rehab.PMH:HTN, HLD, tachycardia, A-fib, CKD, and chest pain (other than cardiac), hx of lumbar laminectomy, breast CA, ESRD on dialysis, DM, polysubstance abuse, HEP C  PRECAUTIONS: dialysis port in chest , dialysis-Tues, Thurs Sat schedule  WEIGHT BEARING RESTRICTIONS: No  PAIN:  Are you having pain? Yes: NPRS scale: 7/10 Pain location: RUE Pain description: aching Aggravating factors: malpositioning Relieving factors: repositioning    FALLS: Has patient fallen in last 6 months? No  LIVING ENVIRONMENT: Lives with: lives alone Lives in: House/apartment   PLOF: Independent  PATIENT GOALS: To use her RUE better  OBJECTIVE:   HAND DOMINANCE: Right  ADLs: Overall ADLs: mod I Transfers/ambulation related to ADLs: Eating: mod I using LUE Grooming: mod I UB Dressing: mod I LB Dressing: mod I Toileting:  mod I Bathing: mod I Tub Shower transfers: mod I   IADLs: Shopping: needs assist for transportation Light housekeeping: sister helps with heavier cleaning Meal Prep: mod I with stovetop and microwave Community mobility: mod I  Medication management: Pt's sister loads box and pt takes after meds are loaded Financial management: needs assist fro transportation Handwriting: unable  MOBILITY STATUS: Independent    FUNCTIONAL OUTCOME MEASURES: Quick Dash: 81.8% disability, higher disability=greater impairment   UPPER EXTREMITY ROM:  LUE A/ROM WFLS, RUE composite flexion/ extension : 25%  Active ROM Right eval Left eval  Shoulder flexion 45   Shoulder abduction    Shoulder adduction    Shoulder extension    Shoulder internal rotation    Shoulder external rotation    Elbow flexion 100   Elbow extension -55   Wrist flexion 45   Wrist extension 20   Wrist ulnar deviation    Wrist radial deviation    Wrist pronation WFLS   Wrist supination 60%   (Blank rows = not tested)     HAND FUNCTION: Grip strength: Right:  unable   Left: NT   COORDINATION: Box and Blocks:  Right unable  Left NT  SENSATION: Appears intact  EDEMA: moderate in R hand    COGNITION: Overall cognitive status:  to be further assessed in a functional context, pt has aphasia  VISION: Subjective report: deneis visual changes   VISION ASSESSMENT: Not tested      OBSERVATIONS: Pt has remaining expressive aphasia from CVA   TODAY'S TREATMENT:                                                                                                                              DATE:  09/07/22- BP 154/94 Tableslides for shoulder flexion and shoulder abduction, min v.c 10-15 mins  A/ROM supination/ pronation and wrist flexion/ extension Hotpack applied to right hand x 5 mins  for stiffness, no adverse reactions. Passive stretch to digits in flexion and extension,individually and compositely Functional  grasp release of wooden dowel pegs to remove from pegboard with RUE  with min facilitation/ v.c, replacing with mod- max facilitation/ v.c  for increased pinch and functional use of RUE.   08/31/22- BP on arrival was 185/107, pt was given water and she rested. BP remained elevated 176/102. Pt's OT treatment was with held due to elevated BP. Pt's sister was present and pt was encouraged to purchase a BP cuff for use at home. Pt was instructed to go home and take it easy and to call 911 if any CVA symptoms.  08/26/22- BP 162/88 Hotpack applied to R hand x 6 mins for pain and stiffness. No adverse reactions P/ROM to digits individually and compositely, followed by A/ROM finger flexion /extension A/ROM wrist flexion/ extension, A/ROM supination/ pronation. Crumpling plastic bag then paper towel  for R finger flexion/ extension Tableslides for shoulder flexion and abduction 20 reps each, min v.c Using RUE to pinch large wooden dowel pegs to remove from pegboard with min difficulty, drops replacing with RUE, however mod A for LUE for positioning.     08/24/22- Pt arrived wearing edema glove. Mild edema present when glove was removed.  Hotpack applied to right hand x 5 mins, for pain and stiffness, no adverse reactions. P/ROM to digits individually and compositely, MP and PIP flexion A/ROM wrist flexion/ extension, A/ROM supination/ pronation. Supine on mat attempted shoulder flexion closed chain however pt with significant pain today Sidelying on R side while performing A/ROM elbow flexion/ extension then supination/ pronation,  Pt's sister arrived at end of session requesting pt's BP wa smonitored. BP+ 190/104. Pt was given water and a rest break. Therapist reviewed and issued CVa warning/ symptoms with pt/ sister. Pt has not taken her mid day dose of BP meds. Pt's BP after rest break was 172/97. Pt was instructed to go home and take it easy. She plans to take her BP meds when she gets home. Pt was  instructed to call 911 if CVA warning signs.    08/17/22-Pt arrived wearing  edema glove. Moderate edema present when glove was removed. Hand was washed at sink to remove dead skin and retrograde massage was performed with good results. Pt practiced as well. A/ROM, AA/ROM finger flexion /extension composite, and hook fist,  then A/ROM wrist flexion/ extension, supination pronation, and elbow flexion/extension. Closed chain shoulder flexion low range reach for floor to lap, then elbow flexion/ extension.  Pt was pain free at end of seesion  PATIENT EDUCATION: Education details: updates to HEP Person educated: Patient Education method: Explanation, Demonstration, and Verbal cues Education comprehension: verbalized understanding, returned demonstration, and verbal cues required   HOME EXERCISE PROGRAM: 08/10/22 beginning A/ROM HEP   GOALS: Goals reviewed with patient? No  SHORT TERM GOALS: Target date: 10/09/22  I with inial HEP Baseline:dependent Goal status: ongoing  2.  Pt will demonstrate at least 60%  RUE composite finger flexion in prep for functional use. Baseline: 25% Goal status: ongoing  3.  Pt will demonstrate 55* shoulder flexion in prep for functional reach. Baseline: 45* Goal status: ongoing  4.  Pt will verbalize understanding of edema and pain reduction techniques control techniques Baseline: Pt needs reinforcement and additional education Goal status: ongoing  5.  Pt will use RUE as a stabilizer/ gross assist at least 25% of the time for ADLS/ IADLs. Baseline: uses less than 10% Goal status: ongoing  6.  Pt will demonstrate at least -45 R elbow extension in prep for functional reach. Baseline: -55 Goal status: ongoing LONG TERM GOALS: Target date: 11/01/21  I with updated HEP Baseline: dependent Goal status: INITIAL  2.  Pt will improve Quick Dash score to 76%% or better. Baseline: 81.8% Goal status: INITIAL  3.  Pt will demonstrate ability to grasp/  release a 3 inch cone with RUE. Baseline: unable Goal status: INITIAL  4.  Pt will use RUE as a stabilizer/ gross assist at least 40% of the time for ADLs. Baseline:uses less than 10%  Goal status: INITIAL  5.  Pt will demonstrate ability to touch a target with 60* RUE shoulder flexion in prep for functional reach. Baseline: 45 shoulder flexion, Goal status: INITIAL  6.  Pt will report RUE pain no greater than 6/10 for light functional use. Baseline: 10/10 with use Goal status: INITIAL  ASSESSMENT:  CLINICAL IMPRESSION: Pt is progressing slowly towards goals with improving RUE functional use. PERFORMANCE DEFICITS: in functional skills including ADLs, IADLs, coordination, dexterity, tone, ROM, strength, pain, flexibility, Fine motor control, Gross motor control, endurance, decreased knowledge of precautions, decreased knowledge of use of DME, and UE functional use, cognitive skills including attention, problem solving, safety awareness, thought, and understand, and psychosocial skills including coping strategies, environmental adaptation, habits, interpersonal interactions, and routines and behaviors.   IMPAIRMENTS: are limiting patient from ADLs, IADLs, rest and sleep, play, leisure, and social participation.   CO-MORBIDITIES: may have co-morbidities  that affects occupational performance. Patient will benefit from skilled OT to address above impairments and improve overall function.  MODIFICATION OR ASSISTANCE TO COMPLETE EVALUATION: No modification of tasks or assist necessary to complete an evaluation.  OT OCCUPATIONAL PROFILE AND HISTORY: Detailed assessment: Review of records and additional review of physical, cognitive, psychosocial history related to current functional performance.  CLINICAL DECISION MAKING: LOW - limited treatment options, no task modification necessary  REHAB POTENTIAL: Good  EVALUATION COMPLEXITY: Low    PLAN:  OT FREQUENCY: 2x/week  OT DURATION:  12 weeks plus eval- anticipate d/c following 6-8 weeks dependent on progress.  PLANNED INTERVENTIONS: self  care/ADL training, therapeutic exercise, therapeutic activity, neuromuscular re-education, manual therapy, passive range of motion, splinting, paraffin, fluidotherapy, moist heat, cryotherapy, contrast bath, patient/family education, cognitive remediation/compensation, energy conservation, coping strategies training, DME and/or AE instructions, and Re-evaluation  RECOMMENDED OTHER SERVICES: n/a  CONSULTED AND AGREED WITH PLAN OF CARE: Patient  PLAN FOR NEXT SESSION:  monitor BP,edema control, gentle ROM/ NMR, functional use   Kayshawn Ozburn, OT 09/07/2022, 10:41 AM

## 2022-09-09 ENCOUNTER — Ambulatory Visit: Payer: 59 | Admitting: Occupational Therapy

## 2022-09-14 ENCOUNTER — Ambulatory Visit: Payer: 59 | Admitting: Occupational Therapy

## 2022-09-14 DIAGNOSIS — M25511 Pain in right shoulder: Secondary | ICD-10-CM | POA: Diagnosis not present

## 2022-09-14 DIAGNOSIS — M79601 Pain in right arm: Secondary | ICD-10-CM

## 2022-09-14 DIAGNOSIS — R600 Localized edema: Secondary | ICD-10-CM

## 2022-09-14 DIAGNOSIS — M25631 Stiffness of right wrist, not elsewhere classified: Secondary | ICD-10-CM

## 2022-09-14 DIAGNOSIS — G8191 Hemiplegia, unspecified affecting right dominant side: Secondary | ICD-10-CM

## 2022-09-14 DIAGNOSIS — M25641 Stiffness of right hand, not elsewhere classified: Secondary | ICD-10-CM

## 2022-09-14 DIAGNOSIS — R278 Other lack of coordination: Secondary | ICD-10-CM

## 2022-09-14 DIAGNOSIS — M6281 Muscle weakness (generalized): Secondary | ICD-10-CM

## 2022-09-14 DIAGNOSIS — M25611 Stiffness of right shoulder, not elsewhere classified: Secondary | ICD-10-CM

## 2022-09-14 NOTE — Therapy (Signed)
OUTPATIENT OCCUPATIONAL THERAPY NEURO Treatment  Patient Name: Monique Henderson MRN: [id number] DOB:01-04-1951, 72 y.o., female Today's Date: 09/14/2022  PCP: Dr. Andrey Campanile REFERRING PROVIDER: Elijah Birk, DO  END OF SESSION:      Past Medical History:  Diagnosis Date   AF (paroxysmal atrial fibrillation) 05/29/2019   on Coumadin   Aortic atherosclerosis 07/05/2019   Aortic dissection 04/04/2019   s/p repair   Bone spur 2008   Right calcaneal foot spur   Breast cancer 2004   Ductal carcinoma in situ of the left breast; S/P left partial mastectomy 02/26/2003; S/P re-excision of cranial and lateral margins11/18/2004.radiation   Carotid artery disease    Cerebral thrombosis with cerebral infarction 05/22/2019   Chronic HFrEF (heart failure with reduced ejection fraction)    Chronic low back pain 06/22/2016   Chronic obstructive lung disease 01/16/2017   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 Chevis Pretty; S/P re-excision of superior and inferior margins 10/27/2012.    ESRD on hemodialysis 05/29/2019   Essential hypertension 09/16/2006   GERD 09/16/2006   Hepatitis C    treated and RNA confirmed not detectable 01/2017   Insomnia 03/14/2015   Malnutrition of moderate degree 05/19/2019   Non compliance with medical treatment 12/04/2017   Normocytic anemia    With thrombocytosis   Osteoarthritis    Right ureteral stone 2002   S/P lumbar spinal fusion 01/18/2014   S/P lumbar decompressive laminectomy, fusion, and plating for lumbar spinal stenosis on 05/27/2009 by Dr. Tia Alert.  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 Tia Alert.     Stroke (cerebrum)    Tobacco use disorder 04/19/2009   Uterine fibroid    Wears  dentures    top   Past Surgical History:  Procedure Laterality Date   ANTERIOR LAT LUMBAR FUSION N/A 01/18/2014   Procedure: ANTERIOR LATERAL LUMBAR FUSION LUMBAR TWO-THREE;  Surgeon: Tia Alert, MD;  Location: [facility];  Service: Neurosurgery;  Laterality: N/A;   ANTERIOR LUMBAR FUSION  01/18/2014   AV FISTULA PLACEMENT Left 04/20/2019   Procedure: ARTERIOVENOUS (AV) FISTULA CREATION;  Surgeon: Maeola Harman, MD;  Location: [facility] OR;  Service: Vascular;  Laterality: Left;   BACK SURGERY     BREAST LUMPECTOMY Left 01/2003   BREAST LUMPECTOMY Right 2014   BREAST LUMPECTOMY WITH NEEDLE LOCALIZATION AND AXILLARY SENTINEL LYMPH NODE BX Right 10/13/2012   Procedure: BREAST LUMPECTOMY WITH NEEDLE LOCALIZATION;  Surgeon: Robyne Askew, MD;  Location: [facility];  Service: General;  Laterality: Right;  Right breast wire localized lumpectomy   INSERTION OF DIALYSIS CATHETER Right 04/20/2019   Procedure: INSERTION OF DIALYSIS CATHETER, right internal jugular;  Surgeon: Maeola Harman, MD;  Location: [facility] OR;  Service: Vascular;  Laterality: Right;   INSERTION OF DIALYSIS CATHETER Right 09/24/2020   Procedure: INSERTION OF TUNNELED DIALYSIS CATHETER;  Surgeon: Larina Earthly, MD;  Location: [facility] OR;  Service: Vascular;  Laterality: Right;   IR FLUORO GUIDE CV LINE RIGHT  09/22/2020   IR THORACENTESIS ASP PLEURAL SPACE W/IMG GUIDE  05/19/2019   IR US GUIDE VASC ACCESS LEFT  09/22/2020   IR US GUIDE VASC ACCESS RIGHT  09/22/2020   IR VENOCAVAGRAM SVC  09/22/2020   LAMINECTOMY  05/27/2009  Lumbar decompressive laminectomy, fusion and plating for lumbar spinal stensosis   LIGATION OF ARTERIOVENOUS  FISTULA Left 09/24/2020   Procedure: LIGATION OF LEFT ARM ARTERIOVENOUS  FISTULA;  Surgeon: Larina Earthly, MD;  Location: [facility] OR;  Service: Vascular;  Laterality: Left;   LUMBAR LAMINECTOMY/DECOMPRESSION MICRODISCECTOMY Left 03/23/2013   Procedure: LUMBAR  LAMINECTOMY/DECOMPRESSION MICRODISCECTOMY 1 LEVEL;  Surgeon: Tia Alert, MD;  Location: [facility];  Service: Neurosurgery;  Laterality: Left;  LUMBAR LAMINECTOMY/DECOMPRESSION MICRODISCECTOMY 1 LEVEL   MASTECTOMY, PARTIAL Left 02/26/2003   ; S/P re-excision of cranial and lateral margins 04/19/2003.    RE-EXCISION OF BREAST CANCER,SUPERIOR MARGINS Right 10/27/2012   Procedure: RE-EXCISION OF BREAST CANCER,SUPERIOR and inferior MARGINS;  Surgeon: Robyne Askew, MD;  Location: [facility] OR;  Service: General;  Laterality: Right;   RE-EXCISION OF BREAST LUMPECTOMY Left 04/2003   TEE WITHOUT CARDIOVERSION N/A 04/04/2019   Procedure: Transesophageal Echocardiogram (Tee);  Surgeon: Linden Dolin, MD;  Location: [facility] OR;  Service: Open Heart Surgery;  Laterality: N/A;   THORACIC AORTIC ANEURYSM REPAIR N/A 04/04/2019   Procedure: THORACIC ASCENDING ANEURYSM REPAIR (AAA)  USING 28 MM X 30 CM HEMASHIELD PLATINUM VASCULAR GRAFT;  Surgeon: Linden Dolin, MD;  Location: [facility] OR;  Service: Open Heart Surgery;  Laterality: N/A;   Patient Active Problem List   Diagnosis Date Noted   Heart failure with mildly reduced ejection fraction (HFmrEF) 08/13/2022   Agitation 08/13/2022   Pulmonary edema, acute 08/11/2022   Right shoulder pain 06/17/2022   Edema of right upper arm 06/17/2022   Right hemiparesis 04/06/2022   Acute cardioembolic stroke 02/26/2022   Stroke (cerebrum) 02/23/2022   Stroke 02/23/2022   Hypervolemia associated with renal insufficiency 01/29/2022   Dyslipidemia 01/29/2022   DNR (do not resuscitate) 01/29/2022   Polysubstance abuse 01/06/2022   Acute on chronic combined systolic and diastolic CHF (congestive heart failure) 01/06/2022   Abdominal pain 01/06/2022   Acute metabolic encephalopathy 12/24/2021   History of hepatitis C 12/08/2021   Hypertensive urgency 10/29/2021   Fluid overload 10/29/2021   Acute on chronic HFrEF (heart failure with reduced ejection fraction) 10/29/2021    Chronic anticoagulation 10/29/2021   Prolonged QT interval 10/29/2021   Leukopenia 10/29/2021   SOB (shortness of breath) 09/15/2021   Hypertensive emergency 08/23/2021   Altered mental status    Malignant hypertension 06/19/2021   Anemia of renal disease 06/05/2021   Chronic diastolic congestive heart failure 04/30/2021   Pure hypercholesterolemia 02/14/2021   Acute respiratory failure with hypoxia 09/21/2020   Acute renal failure superimposed on chronic kidney disease 09/21/2020   Community acquired pneumonia 09/21/2020   Uremia 09/21/2020   Metabolic acidosis with increased anion gap and accumulation of organic acids 09/21/2020   Hypertensive crisis 09/21/2020   Troponin level elevated 08/28/2019   Positive D dimer 08/28/2019   Hyperkalemia 08/28/2019   SVT (supraventricular tachycardia) 08/28/2019   SIRS (systemic inflammatory response syndrome) 08/27/2019   Paroxysmal SVT (supraventricular tachycardia) 08/27/2019   Aortic atherosclerosis 07/05/2019   Chest pain 07/05/2019   History of CVA (cerebrovascular accident) 05/29/2019   AF (paroxysmal atrial fibrillation) 05/29/2019   ESRD on dialysis 05/29/2019   Cerebral thrombosis with cerebral infarction 05/22/2019   Malnutrition of moderate degree 05/19/2019   Pleural effusion 05/18/2019   Atrial fibrillation with RVR 05/18/2019   S/P aortic aneurysm repair 04/07/2019   Aortic dissection 04/04/2019   Dissection of aorta 04/03/2019   Non compliance with medical treatment 12/04/2017   Chronic obstructive lung  disease 01/16/2017   Chronic low back pain 06/22/2016   Insomnia 03/14/2015   S/P lumbar spinal fusion 01/18/2014   Tobacco use disorder 04/19/2009   GERD 09/16/2006    ONSET DATE: 07/09/22- referral date  REFERRING DIAG: R60.0 (ICD-10-CM) - Edema of right upper arm M25.511 (ICD-10-CM) - Right shoulder pain, unspecified chronicity G81.91 (ICD-10-CM) - Right hemiparesis (HCC)   THERAPY DIAG:  Right shoulder pain,  unspecified chronicity  Muscle weakness (generalized)  Other lack of coordination  Stiffness of right hand, not elsewhere classified  Stiffness of right wrist, not elsewhere classified  Stiffness of right shoulder, not elsewhere classified  Pain in right arm  Edema of right upper arm  Localized edema  Right hemiparesis  Rationale for Evaluation and Treatment: Rehabilitation  SUBJECTIVE:   SUBJECTIVE STATEMENT: Pt reports she had a good birthday   PERTINENT HISTORY: She suffered a stroke on 02/22/22 to her Lt posterior-frontal parietal and anterior-parietal areas causing dysphagia, right hemiplegia, speech issues, and cognitive problems. She spent ~2 weeks in the hospital, in inpatient rehab.PMH:HTN, HLD, tachycardia, A-fib, CKD, and chest pain (other than cardiac), hx of lumbar laminectomy, breast CA, ESRD on dialysis, DM, polysubstance abuse, HEP C  PRECAUTIONS: dialysis port in chest , dialysis-Tues, Thurs Sat schedule  WEIGHT BEARING RESTRICTIONS: No  PAIN:  Are you having pain? Yes: NPRS scale: 9/10 Pain location: RUE Pain description: aching Aggravating factors: malpositioning Relieving factors: repositioning  - pain on arrival, no pain at end of OT session  FALLS: Has patient fallen in last 6 months? No  LIVING ENVIRONMENT: Lives with: lives alone Lives in: House/apartment   PLOF: Independent  PATIENT GOALS: To use her RUE better  OBJECTIVE:   HAND DOMINANCE: Right  ADLs: Overall ADLs: mod I Transfers/ambulation related to ADLs: Eating: mod I using LUE Grooming: mod I UB Dressing: mod I LB Dressing: mod I Toileting: mod I Bathing: mod I Tub Shower transfers: mod I   IADLs: Shopping: needs assist for transportation Light housekeeping: sister helps with heavier cleaning Meal Prep: mod I with stovetop and microwave Community mobility: mod I  Medication management: Pt's sister loads box and pt takes after meds are loaded Financial management:  needs assist fro transportation Handwriting: unable  MOBILITY STATUS: Independent    FUNCTIONAL OUTCOME MEASURES: Quick Dash: 81.8% disability, higher disability=greater impairment   UPPER EXTREMITY ROM:  LUE A/ROM WFLS, RUE composite flexion/ extension : 25%  Active ROM Right eval Left eval  Shoulder flexion 45   Shoulder abduction    Shoulder adduction    Shoulder extension    Shoulder internal rotation    Shoulder external rotation    Elbow flexion 100   Elbow extension -55   Wrist flexion 45   Wrist extension 20   Wrist ulnar deviation    Wrist radial deviation    Wrist pronation WFLS   Wrist supination 60%   (Blank rows = not tested)     HAND FUNCTION: Grip strength: Right: unable   Left: NT   COORDINATION: Box and Blocks:  Right unable  Left NT  SENSATION: Appears intact  EDEMA: moderate in R hand    COGNITION: Overall cognitive status:  to be further assessed in a functional context, pt has aphasia  VISION: Subjective report: deneis visual changes   VISION ASSESSMENT: Not tested      OBSERVATIONS: Pt has remaining expressive aphasia from CVA   TODAY'S TREATMENT:  DATE: 09/14/22- Pt reports monitoring BP prior to arrival and it was lower today. Hotpack to right hand x 8 mins for pain and stiffness, no adverse reactions. P/ROM to digits in composite finger flexion and extension and thumb flexion/ extension, A/ROM wrist extension then A/ROM and P/ROM forearm supination/pronation. A/ROM elbow flexion extension then shoulder flexion reaching towards feet.Functional grasp / release to pick up wooden dowel pegs and remove from pegboard, min difficulty/ v.c Tableslides for RUE shoulder flexion then crumpling and extension a paper towel for A/ROM finger flexion/ extension. Squeezing a paper towel ball.     09/07/22- BP  154/94 Tableslides for shoulder flexion and shoulder abduction, min v.c 10-15 mins  A/ROM supination/ pronation and wrist flexion/ extension Hotpack applied to right hand x 5 mins  for stiffness, no adverse reactions. Passive stretch to digits in flexion and extension,individually and compositely Functional grasp release of wooden dowel pegs to remove from pegboard with RUE  with min facilitation/ v.c, replacing with mod- max facilitation/ v.c  for increased pinch and functional use of RUE.   08/31/22- BP on arrival was 185/107, pt was given water and she rested. BP remained elevated 176/102. Pt's OT treatment was with held due to elevated BP. Pt's sister was present and pt was encouraged to purchase a BP cuff for use at home. Pt was instructed to go home and take it easy and to call 911 if any CVA symptoms.  08/26/22- BP 162/88 Hotpack applied to R hand x 6 mins for pain and stiffness. No adverse reactions P/ROM to digits individually and compositely, followed by A/ROM finger flexion /extension A/ROM wrist flexion/ extension, A/ROM supination/ pronation. Crumpling plastic bag then paper towel  for R finger flexion/ extension Tableslides for shoulder flexion and abduction 20 reps each, min v.c Using RUE to pinch large wooden dowel pegs to remove from pegboard with min difficulty, drops replacing with RUE, however mod A for LUE for positioning.     08/24/22- Pt arrived wearing edema glove. Mild edema present when glove was removed.  Hotpack applied to right hand x 5 mins, for pain and stiffness, no adverse reactions. P/ROM to digits individually and compositely, MP and PIP flexion A/ROM wrist flexion/ extension, A/ROM supination/ pronation. Supine on mat attempted shoulder flexion closed chain however pt with significant pain today Sidelying on R side while performing A/ROM elbow flexion/ extension then supination/ pronation,  Pt's sister arrived at end of session requesting pt's BP wa  smonitored. BP+ 190/104. Pt was given water and a rest break. Therapist reviewed and issued CVa warning/ symptoms with pt/ sister. Pt has not taken her mid day dose of BP meds. Pt's BP after rest break was 172/97. Pt was instructed to go home and take it easy. She plans to take her BP meds when she gets home. Pt was instructed to call 911 if CVA warning signs.    08/17/22-Pt arrived wearing edema glove. Moderate edema present when glove was removed. Hand was washed at sink to remove dead skin and retrograde massage was performed with good results. Pt practiced as well. A/ROM, AA/ROM finger flexion /extension composite, and hook fist,  then A/ROM wrist flexion/ extension, supination pronation, and elbow flexion/extension. Closed chain shoulder flexion low range reach for floor to lap, then elbow flexion/ extension.  Pt was pain free at end of seesion  PATIENT EDUCATION: Education details: see above Person educated: Patient Education method: Explanation, Demonstration, and Verbal cues Education comprehension: verbalized understanding, returned demonstration, and verbal cues required  HOME EXERCISE PROGRAM: 08/10/22 beginning A/ROM HEP   GOALS: Goals reviewed with patient? No  SHORT TERM GOALS: Target date: 10/09/22  I with inial HEP Baseline:dependent Goal status: ongoing  2.  Pt will demonstrate at least 60%  RUE composite finger flexion in prep for functional use. Baseline: 25% Goal status: ongoing  3.  Pt will demonstrate 55* shoulder flexion in prep for functional reach. Baseline: 45* Goal status: ongoing  4.  Pt will verbalize understanding of edema and pain reduction techniques control techniques Baseline: Pt needs reinforcement and additional education Goal status: ongoing  5.  Pt will use RUE as a stabilizer/ gross assist at least 25% of the time for ADLS/ IADLs. Baseline: uses less than 10% Goal status: ongoing  6.  Pt will demonstrate at least -45 R elbow extension  in prep for functional reach. Baseline: -55 Goal status: ongoing LONG TERM GOALS: Target date: 11/01/21  I with updated HEP Baseline: dependent Goal status: INITIAL  2.  Pt will improve Quick Dash score to 76%% or better. Baseline: 81.8% Goal status: INITIAL  3.  Pt will demonstrate ability to grasp/ release a 3 inch cone with RUE. Baseline: unable Goal status: INITIAL  4.  Pt will use RUE as a stabilizer/ gross assist at least 40% of the time for ADLs. Baseline:uses less than 10%  Goal status: INITIAL  5.  Pt will demonstrate ability to touch a target with 60* RUE shoulder flexion in prep for functional reach. Baseline: 45 shoulder flexion, Goal status: INITIAL  6.  Pt will report RUE pain no greater than 6/10 for light functional use. Baseline: 10/10 with use Goal status: INITIAL  ASSESSMENT:  CLINICAL IMPRESSION: Pt is progressing towards goals. Pt rpeorts she is able to use RUe to assist with folding towels. PERFORMANCE DEFICITS: in functional skills including ADLs, IADLs, coordination, dexterity, tone, ROM, strength, pain, flexibility, Fine motor control, Gross motor control, endurance, decreased knowledge of precautions, decreased knowledge of use of DME, and UE functional use, cognitive skills including attention, problem solving, safety awareness, thought, and understand, and psychosocial skills including coping strategies, environmental adaptation, habits, interpersonal interactions, and routines and behaviors.   IMPAIRMENTS: are limiting patient from ADLs, IADLs, rest and sleep, play, leisure, and social participation.   CO-MORBIDITIES: may have co-morbidities  that affects occupational performance. Patient will benefit from skilled OT to address above impairments and improve overall function.  MODIFICATION OR ASSISTANCE TO COMPLETE EVALUATION: No modification of tasks or assist necessary to complete an evaluation.  OT OCCUPATIONAL PROFILE AND HISTORY: Detailed  assessment: Review of records and additional review of physical, cognitive, psychosocial history related to current functional performance.  CLINICAL DECISION MAKING: LOW - limited treatment options, no task modification necessary  REHAB POTENTIAL: Good  EVALUATION COMPLEXITY: Low    PLAN:  OT FREQUENCY: 2x/week  OT DURATION: 12 weeks plus eval- anticipate d/c following 6-8 weeks dependent on progress.  PLANNED INTERVENTIONS: self care/ADL training, therapeutic exercise, therapeutic activity, neuromuscular re-education, manual therapy, passive range of motion, splinting, paraffin, fluidotherapy, moist heat, cryotherapy, contrast bath, patient/family education, cognitive remediation/compensation, energy conservation, coping strategies training, DME and/or AE instructions, and Re-evaluation  RECOMMENDED OTHER SERVICES: n/a  CONSULTED AND AGREED WITH PLAN OF CARE: Patient  PLAN FOR NEXT SESSION:  monitor BP,edema control, gentle ROM/ NMR, functional use   Edge Mauger, OT 09/14/2022, 3:58 PM

## 2022-09-16 ENCOUNTER — Ambulatory Visit: Payer: 59 | Admitting: Occupational Therapy

## 2022-09-16 DIAGNOSIS — M25631 Stiffness of right wrist, not elsewhere classified: Secondary | ICD-10-CM

## 2022-09-16 DIAGNOSIS — M6281 Muscle weakness (generalized): Secondary | ICD-10-CM

## 2022-09-16 DIAGNOSIS — R600 Localized edema: Secondary | ICD-10-CM

## 2022-09-16 DIAGNOSIS — M79601 Pain in right arm: Secondary | ICD-10-CM

## 2022-09-16 DIAGNOSIS — G8191 Hemiplegia, unspecified affecting right dominant side: Secondary | ICD-10-CM

## 2022-09-16 DIAGNOSIS — M25641 Stiffness of right hand, not elsewhere classified: Secondary | ICD-10-CM

## 2022-09-16 DIAGNOSIS — M25511 Pain in right shoulder: Secondary | ICD-10-CM | POA: Diagnosis not present

## 2022-09-16 DIAGNOSIS — M79641 Pain in right hand: Secondary | ICD-10-CM

## 2022-09-16 DIAGNOSIS — M25611 Stiffness of right shoulder, not elsewhere classified: Secondary | ICD-10-CM

## 2022-09-16 DIAGNOSIS — R278 Other lack of coordination: Secondary | ICD-10-CM

## 2022-09-16 NOTE — Therapy (Signed)
OUTPATIENT OCCUPATIONAL THERAPY NEURO Treatment  Patient Name: Monique Henderson MRN: [id number] DOB:[birth year], 72 y.o., female Today's Date: 09/16/2022  PCP: Dr. Andrey Campanile REFERRING PROVIDER: Elijah Birk, DO  END OF SESSION:  OT End of Session - 09/16/22 1219     Visit Number 7    Number of Visits 25    Date for OT Re-Evaluation 11/02/22    Authorization Type [employer] Medicare    Authorization - Visit Number 7    Progress Note Due on Visit 10    OT Start Time 1215    OT Stop Time 1300    OT Time Calculation (min) 45 min                Past Medical History:  Diagnosis Date   AF (paroxysmal atrial fibrillation) 05/29/2019   on Coumadin   Aortic atherosclerosis 07/05/2019   Aortic dissection 04/04/2019   s/p repair   Bone spur 2008   Right calcaneal foot spur   Breast cancer 2004   Ductal carcinoma in situ of the left breast; S/P left partial mastectomy 02/26/2003; S/P re-excision of cranial and lateral margins11/18/2004.radiation   Carotid artery disease    Cerebral thrombosis with cerebral infarction 05/22/2019   Chronic HFrEF (heart failure with reduced ejection fraction)    Chronic low back pain 06/22/2016   Chronic obstructive lung disease 01/16/2017   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 Chevis Pretty; S/P re-excision of superior and inferior margins 10/27/2012.    ESRD on hemodialysis 05/29/2019   Essential hypertension 09/16/2006   GERD 09/16/2006   Hepatitis C    treated and RNA confirmed not detectable 01/2017   Insomnia 03/14/2015   Malnutrition of moderate degree 05/19/2019   Non compliance with medical treatment 12/04/2017   Normocytic anemia    With thrombocytosis   Osteoarthritis    Right ureteral stone 2002   S/P lumbar spinal fusion 01/18/2014   S/P lumbar decompressive laminectomy, fusion, and plating for lumbar spinal stenosis on 05/27/2009 by Dr. Tia Alert.  S/P anterolateral  retroperitoneal interbody fusion L2-3 utilizing a 8 mm peek interbody cage packed with morcellized allograft, and anterior lumbar plating L2-3 for recurrent disc herniation L2-3 with spinal stenosis on 01/18/2014 by Dr. Tia Alert.     Stroke (cerebrum)    Tobacco use disorder 04/19/2009   Uterine fibroid    Wears dentures    top   Past Surgical History:  Procedure Laterality Date   ANTERIOR LAT LUMBAR FUSION N/A 01/18/2014   Procedure: ANTERIOR LATERAL LUMBAR FUSION LUMBAR TWO-THREE;  Surgeon: Tia Alert, MD;  Location: [facility];  Service: Neurosurgery;  Laterality: N/A;   ANTERIOR LUMBAR FUSION  01/18/2014   AV FISTULA PLACEMENT Left 04/20/2019   Procedure: ARTERIOVENOUS (AV) FISTULA CREATION;  Surgeon: Maeola Harman, MD;  Location: [facility] OR;  Service: Vascular;  Laterality: Left;   BACK SURGERY     BREAST LUMPECTOMY Left 01/2003   BREAST LUMPECTOMY Right 2014   BREAST LUMPECTOMY WITH NEEDLE LOCALIZATION AND AXILLARY SENTINEL LYMPH NODE BX Right 10/13/2012   Procedure: BREAST LUMPECTOMY WITH NEEDLE LOCALIZATION;  Surgeon: Robyne Askew, MD;  Location: [facility];  Service: General;  Laterality: Right;  Right breast wire localized lumpectomy   INSERTION OF DIALYSIS CATHETER Right 04/20/2019   Procedure: INSERTION OF DIALYSIS CATHETER, right internal jugular;  Surgeon: Maeola Harman, MD;  Location: [facility] OR;  Service: Vascular;  Laterality: Right;   INSERTION OF DIALYSIS CATHETER Right 09/24/2020   Procedure: INSERTION OF TUNNELED DIALYSIS CATHETER;  Surgeon: Larina Earthly, MD;  Location: [facility] OR;  Service: Vascular;  Laterality: Right;   IR FLUORO GUIDE CV LINE RIGHT  09/22/2020   IR THORACENTESIS ASP PLEURAL SPACE W/IMG GUIDE  05/19/2019   IR US GUIDE VASC ACCESS LEFT  09/22/2020   IR US GUIDE VASC ACCESS RIGHT  09/22/2020   IR VENOCAVAGRAM SVC  09/22/2020   LAMINECTOMY  05/27/2009   Lumbar decompressive laminectomy, fusion and plating for lumbar  spinal stensosis   LIGATION OF ARTERIOVENOUS  FISTULA Left 09/24/2020   Procedure: LIGATION OF LEFT ARM ARTERIOVENOUS  FISTULA;  Surgeon: Larina Earthly, MD;  Location: [facility] OR;  Service: Vascular;  Laterality: Left;   LUMBAR LAMINECTOMY/DECOMPRESSION MICRODISCECTOMY Left 03/23/2013   Procedure: LUMBAR LAMINECTOMY/DECOMPRESSION MICRODISCECTOMY 1 LEVEL;  Surgeon: Tia Alert, MD;  Location: [facility];  Service: Neurosurgery;  Laterality: Left;  LUMBAR LAMINECTOMY/DECOMPRESSION MICRODISCECTOMY 1 LEVEL   MASTECTOMY, PARTIAL Left 02/26/2003   ; S/P re-excision of cranial and lateral margins 04/19/2003.    RE-EXCISION OF BREAST CANCER,SUPERIOR MARGINS Right 10/27/2012   Procedure: RE-EXCISION OF BREAST CANCER,SUPERIOR and inferior MARGINS;  Surgeon: Robyne Askew, MD;  Location: [facility] OR;  Service: General;  Laterality: Right;   RE-EXCISION OF BREAST LUMPECTOMY Left 04/2003   TEE WITHOUT CARDIOVERSION N/A 04/04/2019   Procedure: Transesophageal Echocardiogram (Tee);  Surgeon: Linden Dolin, MD;  Location: [facility] OR;  Service: Open Heart Surgery;  Laterality: N/A;   THORACIC AORTIC ANEURYSM REPAIR N/A 04/04/2019   Procedure: THORACIC ASCENDING ANEURYSM REPAIR (AAA)  USING 28 MM X 30 CM HEMASHIELD PLATINUM VASCULAR GRAFT;  Surgeon: Linden Dolin, MD;  Location: [facility] OR;  Service: Open Heart Surgery;  Laterality: N/A;   Patient Active Problem List   Diagnosis Date Noted   Heart failure with mildly reduced ejection fraction (HFmrEF) 08/13/2022   Agitation 08/13/2022   Pulmonary edema, acute 08/11/2022   Right shoulder pain 06/17/2022   Edema of right upper arm 06/17/2022   Right hemiparesis 04/06/2022   Acute cardioembolic stroke 02/26/2022   Stroke (cerebrum) 02/23/2022   Stroke 02/23/2022   Hypervolemia associated with renal insufficiency 01/29/2022   Dyslipidemia 01/29/2022   DNR (do not resuscitate) 01/29/2022   Polysubstance abuse 01/06/2022   Acute on chronic combined systolic and diastolic  CHF (congestive heart failure) 01/06/2022   Abdominal pain 01/06/2022   Acute metabolic encephalopathy 12/24/2021   History of hepatitis C 12/08/2021   Hypertensive urgency 10/29/2021   Fluid overload 10/29/2021   Acute on chronic HFrEF (heart failure with reduced ejection fraction) 10/29/2021   Chronic anticoagulation 10/29/2021   Prolonged QT interval 10/29/2021   Leukopenia 10/29/2021   SOB (shortness of breath) 09/15/2021   Hypertensive emergency 08/23/2021   Altered mental status    Malignant hypertension 06/19/2021   Anemia of renal disease 06/05/2021   Chronic diastolic congestive heart failure 04/30/2021   Pure hypercholesterolemia 02/14/2021   Acute respiratory failure with hypoxia 09/21/2020   Acute renal failure superimposed on chronic kidney disease 09/21/2020   Community acquired pneumonia 09/21/2020   Uremia 09/21/2020   Metabolic acidosis with increased anion gap and accumulation of organic acids 09/21/2020   Hypertensive crisis 09/21/2020   Troponin level elevated 08/28/2019   Positive D dimer 08/28/2019   Hyperkalemia 08/28/2019   SVT (supraventricular tachycardia) 08/28/2019   SIRS (systemic inflammatory response syndrome) 08/27/2019   Paroxysmal SVT (supraventricular tachycardia) 08/27/2019  Aortic atherosclerosis 07/05/2019   Chest pain 07/05/2019   History of CVA (cerebrovascular accident) 05/29/2019   AF (paroxysmal atrial fibrillation) 05/29/2019   ESRD on dialysis 05/29/2019   Cerebral thrombosis with cerebral infarction 05/22/2019   Malnutrition of moderate degree 05/19/2019   Pleural effusion 05/18/2019   Atrial fibrillation with RVR 05/18/2019   S/P aortic aneurysm repair 04/07/2019   Aortic dissection 04/04/2019   Dissection of aorta 04/03/2019   Non compliance with medical treatment 12/04/2017   Chronic obstructive lung disease 01/16/2017   Chronic low back pain 06/22/2016   Insomnia 03/14/2015   S/P lumbar spinal fusion 01/18/2014   Tobacco  use disorder 04/19/2009   GERD 09/16/2006    ONSET DATE: 07/09/22- referral date  REFERRING DIAG: R60.0 (ICD-10-CM) - Edema of right upper arm M25.511 (ICD-10-CM) - Right shoulder pain, unspecified chronicity G81.91 (ICD-10-CM) - Right hemiparesis (HCC)   THERAPY DIAG:  Right shoulder pain, unspecified chronicity  Muscle weakness (generalized)  Other lack of coordination  Stiffness of right hand, not elsewhere classified  Stiffness of right wrist, not elsewhere classified  Stiffness of right shoulder, not elsewhere classified  Pain in right arm  Pain in right hand  Localized edema  Right hemiparesis  Edema of right upper arm  Rationale for Evaluation and Treatment: Rehabilitation  SUBJECTIVE:   SUBJECTIVE STATEMENT: Pt requests therapist to take her early for her appointment   PERTINENT HISTORY: She suffered a stroke on 02/22/22 to her Lt posterior-frontal parietal and anterior-parietal areas causing dysphagia, right hemiplegia, speech issues, and cognitive problems. She spent ~2 weeks in the hospital, in inpatient rehab.PMH:HTN, HLD, tachycardia, A-fib, CKD, and chest pain (other than cardiac), hx of lumbar laminectomy, breast CA, ESRD on dialysis, DM, polysubstance abuse, HEP C  PRECAUTIONS: dialysis port in chest , dialysis-Tues, Thurs Sat schedule  WEIGHT BEARING RESTRICTIONS: No  PAIN:  Are you having pain? Yes: NPRS scale: 3/10 Pain location: RUE Pain description: aching Aggravating factors: malpositioning Relieving factors: repositioning  - pain on arrival, no pain at end of OT session  FALLS: Has patient fallen in last 6 months? No  LIVING ENVIRONMENT: Lives with: lives alone Lives in: House/apartment   PLOF: Independent  PATIENT GOALS: To use her RUE better  OBJECTIVE:   HAND DOMINANCE: Right  ADLs: Overall ADLs: mod I Transfers/ambulation related to ADLs: Eating: mod I using LUE Grooming: mod I UB Dressing: mod I LB Dressing: mod  I Toileting: mod I Bathing: mod I Tub Shower transfers: mod I   IADLs: Shopping: needs assist for transportation Light housekeeping: sister helps with heavier cleaning Meal Prep: mod I with stovetop and microwave Community mobility: mod I  Medication management: Pt's sister loads box and pt takes after meds are loaded Financial management: needs assist fro transportation Handwriting: unable  MOBILITY STATUS: Independent    FUNCTIONAL OUTCOME MEASURES: Quick Dash: 81.8% disability, higher disability=greater impairment   UPPER EXTREMITY ROM:  LUE A/ROM WFLS, RUE composite flexion/ extension : 25%  Active ROM Right eval Left eval  Shoulder flexion 45   Shoulder abduction    Shoulder adduction    Shoulder extension    Shoulder internal rotation    Shoulder external rotation    Elbow flexion 100   Elbow extension -55   Wrist flexion 45   Wrist extension 20   Wrist ulnar deviation    Wrist radial deviation    Wrist pronation WFLS   Wrist supination 60%   (Blank rows = not tested)  HAND FUNCTION: Grip strength: Right: unable   Left: NT   COORDINATION: Box and Blocks:  Right unable  Left NT  SENSATION: Appears intact  EDEMA: moderate in R hand    COGNITION: Overall cognitive status:  to be further assessed in a functional context, pt has aphasia  VISION: Subjective report: deneis visual changes   VISION ASSESSMENT: Not tested      OBSERVATIONS: Pt has remaining expressive aphasia from CVA   TODAY'S TREATMENT:                                                                                                                              DATE: 09/16/22- Pt reports monitoring BP at home. Today BP was 143/ 85. Hotpack x 5 mins to right hand for pain and stiffness, no adverse reactions. Passive stretch to digits individually then compositely, pt also assisted with self passive composite flexion Crumpling paper towel for finger flexion/ extension x 10  reps, A/ROM supination/ pronation x 10 reps. Functional grasp/ release of 1 inch blocks to place in container with RUE. Therapist checked progress towards short term goals. Functional grasp/ release of 1 inch blocks with RUE, min difficulty/ v.c Folding a towel with RUE, min v.c facilitation for grasp. 09/14/22- Pt reports monitoring BP prior to arrival and it was lower today. Hotpack to right hand x 8 mins for pain and stiffness, no adverse reactions. P/ROM to digits in composite finger flexion and extension and thumb flexion/ extension, A/ROM wrist extension then A/ROM and P/ROM forearm supination/pronation. A/ROM elbow flexion extension then shoulder flexion reaching towards feet.Functional grasp / release to pick up wooden dowel pegs and remove from pegboard, min difficulty/ v.c Tableslides for RUE shoulder flexion then crumpling and extension a paper towel for A/ROM finger flexion/ extension. Squeezing a paper towel ball.     09/07/22- BP 154/94 Tableslides for shoulder flexion and shoulder abduction, min v.c 10-15 mins  A/ROM supination/ pronation and wrist flexion/ extension Hotpack applied to right hand x 5 mins  for stiffness, no adverse reactions. Passive stretch to digits in flexion and extension,individually and compositely Functional grasp release of wooden dowel pegs to remove from pegboard with RUE  with min facilitation/ v.c, replacing with mod- max facilitation/ v.c  for increased pinch and functional use of RUE.   08/31/22- BP on arrival was 185/107, pt was given water and she rested. BP remained elevated 176/102. Pt's OT treatment was with held due to elevated BP. Pt's sister was present and pt was encouraged to purchase a BP cuff for use at home. Pt was instructed to go home and take it easy and to call 911 if any CVA symptoms.  08/26/22- BP 162/88 Hotpack applied to R hand x 6 mins for pain and stiffness. No adverse reactions P/ROM to digits individually and compositely,  followed by A/ROM finger flexion /extension A/ROM wrist flexion/ extension, A/ROM supination/ pronation. Crumpling plastic bag then paper towel  for R finger  flexion/ extension Tableslides for shoulder flexion and abduction 20 reps each, min v.c Using RUE to pinch large wooden dowel pegs to remove from pegboard with min difficulty, drops replacing with RUE, however mod A for LUE for positioning.     08/24/22- Pt arrived wearing edema glove. Mild edema present when glove was removed.  Hotpack applied to right hand x 5 mins, for pain and stiffness, no adverse reactions. P/ROM to digits individually and compositely, MP and PIP flexion A/ROM wrist flexion/ extension, A/ROM supination/ pronation. Supine on mat attempted shoulder flexion closed chain however pt with significant pain today Sidelying on R side while performing A/ROM elbow flexion/ extension then supination/ pronation,  Pt's sister arrived at end of session requesting pt's BP wa smonitored. BP+ 190/104. Pt was given water and a rest break. Therapist reviewed and issued CVa warning/ symptoms with pt/ sister. Pt has not taken her mid day dose of BP meds. Pt's BP after rest break was 172/97. Pt was instructed to go home and take it easy. She plans to take her BP meds when she gets home. Pt was instructed to call 911 if CVA warning signs.    08/17/22-Pt arrived wearing edema glove. Moderate edema present when glove was removed. Hand was washed at sink to remove dead skin and retrograde massage was performed with good results. Pt practiced as well. A/ROM, AA/ROM finger flexion /extension composite, and hook fist,  then A/ROM wrist flexion/ extension, supination pronation, and elbow flexion/extension. Closed chain shoulder flexion low range reach for floor to lap, then elbow flexion/ extension.  Pt was pain free at end of seesion  PATIENT EDUCATION: Education details: see above Person educated: Patient Education method: Explanation,  Demonstration, and Verbal cues Education comprehension: verbalized understanding, returned demonstration, and verbal cues required   HOME EXERCISE PROGRAM: 08/10/22 beginning A/ROM HEP   GOALS: Goals reviewed with patient? No  SHORT TERM GOALS: Target date: 10/09/22  I with inial HEP Baseline:dependent Goal status: met, 09/16/22  2.  Pt will demonstrate at least 60%  RUE composite finger flexion in prep for functional use. Baseline: 25% Goal status: 09/16/22 grossly 45% after stretch  3.  Pt will demonstrate 55* shoulder flexion in prep for functional reach. Baseline: 45* Goal status: met 75* shoulder flexion 09/16/22  4.  Pt will verbalize understanding of edema and pain reduction techniques control techniques Baseline: Pt needs reinforcement and additional education Goal status: met, 09/16/22 5.  Pt will use RUE as a stabilizer/ gross assist at least 25% of the time for ADLS/ IADLs. Baseline: uses less than 10% Goal status: met,  09/16/22 30% per pt report 6.  Pt will demonstrate at least -45 R elbow extension in prep for functional reach. Baseline: -55 Goal status: met -20 09/15/21  LONG TERM GOALS: Target date: 11/01/21  I with updated HEP Baseline: dependent Goal status: INITIAL  2.  Pt will improve Quick Dash score to 76%% or better. Baseline: 81.8% Goal status: INITIAL  3.  Pt will demonstrate ability to grasp/ release a 3 inch cone with RUE. Baseline: unable Goal status: INITIAL  4.  Pt will use RUE as a stabilizer/ gross assist at least 40% of the time for ADLs. Baseline:uses less than 10%  Goal status: INITIAL  5.  Pt will demonstrate ability to touch a target with 60* RUE shoulder flexion in prep for functional reach. Baseline: 45 shoulder flexion, Goal status: INITIAL  6.  Pt will report RUE pain no greater than 6/10 for light  functional use. Baseline: 10/10 with use Goal status: INITIAL  ASSESSMENT:  CLINICAL IMPRESSION: Pt is progressing towards  goals. Pt met all short term goals and she is progressing towards long term goals.Marland Kitchen PERFORMANCE DEFICITS: in functional skills including ADLs, IADLs, coordination, dexterity, tone, ROM, strength, pain, flexibility, Fine motor control, Gross motor control, endurance, decreased knowledge of precautions, decreased knowledge of use of DME, and UE functional use, cognitive skills including attention, problem solving, safety awareness, thought, and understand, and psychosocial skills including coping strategies, environmental adaptation, habits, interpersonal interactions, and routines and behaviors.   IMPAIRMENTS: are limiting patient from ADLs, IADLs, rest and sleep, play, leisure, and social participation.   CO-MORBIDITIES: may have co-morbidities  that affects occupational performance. Patient will benefit from skilled OT to address above impairments and improve overall function.  MODIFICATION OR ASSISTANCE TO COMPLETE EVALUATION: No modification of tasks or assist necessary to complete an evaluation.  OT OCCUPATIONAL PROFILE AND HISTORY: Detailed assessment: Review of records and additional review of physical, cognitive, psychosocial history related to current functional performance.  CLINICAL DECISION MAKING: LOW - limited treatment options, no task modification necessary  REHAB POTENTIAL: Good  EVALUATION COMPLEXITY: Low    PLAN:  OT FREQUENCY: 2x/week  OT DURATION: 12 weeks plus eval- anticipate d/c following 6-8 weeks dependent on progress.  PLANNED INTERVENTIONS: self care/ADL training, therapeutic exercise, therapeutic activity, neuromuscular re-education, manual therapy, passive range of motion, splinting, paraffin, fluidotherapy, moist heat, cryotherapy, contrast bath, patient/family education, cognitive remediation/compensation, energy conservation, coping strategies training, DME and/or AE instructions, and Re-evaluation  RECOMMENDED OTHER SERVICES: n/a  CONSULTED AND AGREED  WITH PLAN OF CARE: Patient  PLAN FOR NEXT SESSION:  monitor BP, gentle ROM/ NMR, functional use   Koden Hunzeker, OT 09/16/2022, 12:24 PM

## 2022-09-21 ENCOUNTER — Ambulatory Visit: Payer: 59 | Admitting: Occupational Therapy

## 2022-09-22 ENCOUNTER — Ambulatory Visit: Payer: 59 | Admitting: Interventional Cardiology

## 2022-09-23 ENCOUNTER — Ambulatory Visit: Payer: 59 | Admitting: Occupational Therapy

## 2022-09-25 ENCOUNTER — Emergency Department (HOSPITAL_COMMUNITY): Payer: 59

## 2022-09-25 ENCOUNTER — Emergency Department (HOSPITAL_COMMUNITY)
Admission: EM | Admit: 2022-09-25 | Discharge: 2022-09-25 | Disposition: A | Discharge status: Home / Self Care | Payer: 59 | Attending: Emergency Medicine | Admitting: Emergency Medicine

## 2022-09-25 ENCOUNTER — Encounter (HOSPITAL_COMMUNITY): Payer: Self-pay

## 2022-09-25 ENCOUNTER — Other Ambulatory Visit: Payer: Self-pay

## 2022-09-25 DIAGNOSIS — Z20822 Contact with and (suspected) exposure to covid-19: Secondary | ICD-10-CM | POA: Diagnosis not present

## 2022-09-25 DIAGNOSIS — E8770 Fluid overload, unspecified: Secondary | ICD-10-CM | POA: Diagnosis not present

## 2022-09-25 DIAGNOSIS — R0602 Shortness of breath: Secondary | ICD-10-CM | POA: Diagnosis present

## 2022-09-25 LAB — RESP PANEL BY RT-PCR (RSV, FLU A&B, COVID)  RVPGX2
Influenza A by PCR: NEGATIVE
Influenza B by PCR: NEGATIVE
Resp Syncytial Virus by PCR: NEGATIVE
SARS Coronavirus 2 by RT PCR: NEGATIVE

## 2022-09-25 LAB — CBC WITH DIFFERENTIAL/PLATELET
Abs Immature Granulocytes: 0 10*3/uL (ref 0.00–0.07)
Basophils Absolute: 0.1 10*3/uL (ref 0.0–0.1)
Basophils Relative: 3 %
Eosinophils Absolute: 0 10*3/uL (ref 0.0–0.5)
Eosinophils Relative: 1 %
HCT: 31.2 % — ABNORMAL LOW (ref 36.0–46.0)
Hemoglobin: 10.2 g/dL — ABNORMAL LOW (ref 12.0–15.0)
Lymphocytes Relative: 17 %
Lymphs Abs: 0.7 10*3/uL (ref 0.7–4.0)
MCH: 30.3 pg (ref 26.0–34.0)
MCHC: 32.7 g/dL (ref 30.0–36.0)
MCV: 92.6 fL (ref 80.0–100.0)
Monocytes Absolute: 0.3 10*3/uL (ref 0.1–1.0)
Monocytes Relative: 7 %
Neutro Abs: 2.9 10*3/uL (ref 1.7–7.7)
Neutrophils Relative %: 72 %
Platelets: 169 10*3/uL (ref 150–400)
RBC: 3.37 MIL/uL — ABNORMAL LOW (ref 3.87–5.11)
RDW: 18.5 % — ABNORMAL HIGH (ref 11.5–15.5)
WBC: 4 10*3/uL (ref 4.0–10.5)
nRBC: 0 % (ref 0.0–0.2)
nRBC: 0 /100 WBC

## 2022-09-25 LAB — RENAL FUNCTION PANEL
Albumin: 3.5 g/dL (ref 3.5–5.0)
Anion gap: 21 — ABNORMAL HIGH (ref 5–15)
BUN: 130 mg/dL — ABNORMAL HIGH (ref 8–23)
CO2: 18 mmol/L — ABNORMAL LOW (ref 22–32)
Calcium: 8.5 mg/dL — ABNORMAL LOW (ref 8.9–10.3)
Chloride: 95 mmol/L — ABNORMAL LOW (ref 98–111)
Creatinine, Ser: 18.86 mg/dL — ABNORMAL HIGH (ref 0.44–1.00)
GFR, Estimated: 2 mL/min — ABNORMAL LOW (ref >60)
Glucose, Bld: 93 mg/dL (ref 70–99)
Phosphorus: 7.9 mg/dL — ABNORMAL HIGH (ref 2.5–4.6)
Potassium: 6 mmol/L — ABNORMAL HIGH (ref 3.5–5.1)
Sodium: 134 mmol/L — ABNORMAL LOW (ref 135–145)

## 2022-09-25 LAB — PROTIME-INR
INR: 1.2 (ref 0.8–1.2)
Prothrombin Time: 15.3 seconds — ABNORMAL HIGH (ref 11.4–15.2)

## 2022-09-25 LAB — CBC
HCT: 33.8 % — ABNORMAL LOW (ref 36.0–46.0)
Hemoglobin: 10.9 g/dL — ABNORMAL LOW (ref 12.0–15.0)
MCH: 30 pg (ref 26.0–34.0)
MCHC: 32.2 g/dL (ref 30.0–36.0)
MCV: 93.1 fL (ref 80.0–100.0)
Platelets: 190 10*3/uL (ref 150–400)
RBC: 3.63 MIL/uL — ABNORMAL LOW (ref 3.87–5.11)
RDW: 18.5 % — ABNORMAL HIGH (ref 11.5–15.5)
WBC: 4.3 10*3/uL (ref 4.0–10.5)
nRBC: 0 % (ref 0.0–0.2)

## 2022-09-25 LAB — TROPONIN I (HIGH SENSITIVITY)
Troponin I (High Sensitivity): 144 ng/L (ref <18)
Troponin I (High Sensitivity): 158 ng/L (ref <18)

## 2022-09-25 LAB — HEPATITIS B SURFACE ANTIGEN: Hepatitis B Surface Ag: NONREACTIVE

## 2022-09-25 LAB — LACTIC ACID, PLASMA: Lactic Acid, Venous: 0.7 mmol/L (ref 0.5–1.9)

## 2022-09-25 LAB — MAGNESIUM: Magnesium: 2.6 mg/dL — ABNORMAL HIGH (ref 1.7–2.4)

## 2022-09-25 LAB — APTT: aPTT: 47 seconds — ABNORMAL HIGH (ref 24–36)

## 2022-09-25 MED ORDER — PENTAFLUOROPROP-TETRAFLUOROETH EX AERO: 1.0000 | INHALATION_SPRAY | CUTANEOUS | Status: DC | PRN

## 2022-09-25 MED ORDER — ANTICOAGULANT SODIUM CITRATE 4% (200MG/5ML) IV SOLN: 5.0000 mL | Status: DC | PRN

## 2022-09-25 MED ORDER — LIDOCAINE HCL (PF) 1 % IJ SOLN: 5.0000 mL | INTRAMUSCULAR | Status: DC | PRN

## 2022-09-25 MED ORDER — CALCIUM GLUCONATE-NACL 1-0.675 GM/50ML-% IV SOLN
1.0000 g | Freq: Once | INTRAVENOUS | Status: AC
  Administered 2022-09-25: 1000 mg via INTRAVENOUS
  Filled 2022-09-25: qty 50

## 2022-09-25 MED ORDER — LIDOCAINE-PRILOCAINE 2.5-2.5 % EX CREA: 1.0000 | TOPICAL_CREAM | CUTANEOUS | Status: DC | PRN

## 2022-09-25 MED ORDER — HEPARIN SODIUM (PORCINE) 1000 UNIT/ML DIALYSIS: 1000.0000 [IU] | INTRAMUSCULAR | Status: DC | PRN

## 2022-09-25 MED ORDER — ALTEPLASE 2 MG IJ SOLR: 2.0000 mg | Freq: Once | INTRAMUSCULAR | Status: DC | PRN

## 2022-09-25 MED ORDER — SODIUM ZIRCONIUM CYCLOSILICATE 10 G PO PACK
10.0000 g | PACK | ORAL | Status: AC
  Administered 2022-09-25: 10 g via ORAL
  Filled 2022-09-25: qty 1

## 2022-09-25 NOTE — ED Notes (Signed)
Patient resting in bed with eyes closed, no s/s of distress, will continue to monitor.  

## 2022-09-25 NOTE — ED Notes (Signed)
Pt ambulated around nurses' desk with no dizziness, SOB, or lightheadedness. Pt ambulating with safe, steady gait. Pt given sandwich and drink. Tolerating PO with no N/V.

## 2022-09-25 NOTE — Plan of Care (Addendum)
Monique Henderson [id number] May 04, 1951   ESRD patient on HD TTS at [facility].  Presetned to the due to cough, SOB in setting of missed HD, has not been to HD since 4/18.  Labs significant for K 5.7, CO2 20, SCr 19, BUN 127.  Per ED physician tentative plan for discharge after dialysis.    HD orders:  AF TTS 3.5hrs 400/ AF 1.5 Edw 47.8kg 2K 2Ca Profile 2 TDC No heparin Mircera q2wks - last 4/9 Venofer 50mg  qWk Hectorol qHD  Will write orders for HD today with plan to return to ED for re-evaluation post. Can resume regular schedule tomorrow as outpatient if discharged.   Virgina Norfolk, PA-C [employer]   I saw her in HD -  cannot believe she essentially missed a week of HD !?! Admonished her for such -  will see if will be able to be discharged after HD   Cecille Aver

## 2022-09-25 NOTE — ED Provider Notes (Addendum)
Pt returned from dialysis.  Pt reports she feels better and wants to go home.  Pt denies any chest pain.  Pt has not had food today.  Pt given a sandwich.  Pt able to ambulate.    Elson Areas, PA-C 09/25/22 1508    Elson Areas, PA-C 09/25/22 1515    Alvira Monday, MD 09/25/22 2300

## 2022-09-25 NOTE — Discharge Instructions (Signed)
Return if any problems.

## 2022-09-25 NOTE — ED Provider Notes (Signed)
[place] EMERGENCY DEPARTMENT AT [facility] Provider Note   CSN: [id number] Arrival date & time: 09/25/22  4098     History  Chief Complaint  Patient presents with   Shortness of Breath    Patient to ED via EMS with complaint of sob and cough x 1 day. Patient missed dialysis x 7 days    Monique Henderson is a 72 y.o. female.  Patient presents to the emergency department for evaluation of shortness of breath.  Patient has had shortness of breath, productive cough, nasal congestion.  Symptoms worsening over period of 1 week.  She has not been to dialysis this week because of the symptoms.       Home Medications Prior to Admission medications   Medication Sig Start Date End Date Taking? Authorizing Provider  albuterol (VENTOLIN HFA) 108 (90 Base) MCG/ACT inhaler Inhale 2 puffs into the lungs every 6 (six) hours as needed for wheezing or shortness of breath. 07/30/20   Janeece Agee, NP  amLODipine (NORVASC) 10 MG tablet Take 1 tablet (10 mg total) by mouth daily. 03/06/22   Setzer, Lynnell Jude, PA-C  apixaban (ELIQUIS) 2.5 MG TABS tablet Take 1 tablet (2.5 mg total) by mouth 2 (two) times daily. 03/06/22   Setzer, Lynnell Jude, PA-C  carvedilol (COREG) 25 MG tablet Take 1 tablet (25 mg total) by mouth 2 (two) times daily with a meal. Patient taking differently: Take 12.5 mg by mouth 2 (two) times daily with a meal. 05/01/22 05/02/23  Ghimire, Werner Lean, MD  Cholecalciferol (VITAMIN D-3 PO) Take 1 capsule by mouth daily.    [provider]  cloNIDine (CATAPRES - DOSED IN MG/24 HR) 0.2 mg/24hr patch Place 1 patch (0.2 mg total) onto the skin once a week. 08/18/22 02/16/23  Almon Hercules, MD  hydrALAZINE (APRESOLINE) 25 MG tablet Take 3 tablets (75 mg total) by mouth every 8 (eight) hours. 08/14/22 02/10/23  Almon Hercules, MD  isosorbide mononitrate (IMDUR) 30 MG 24 hr tablet Take 1 tablet (30 mg total) by mouth daily. 04/20/22   Georganna Skeans, MD  naloxone [facility]) nasal spray  4 mg/0.1 mL 1 spray as directed. 02/19/22   [provider]  oxyCODONE-acetaminophen (PERCOCET) 10-325 MG tablet Take 1 tablet by mouth 3 (three) times daily as needed for pain. 03/12/22   [provider]  pantoprazole (PROTONIX) 40 MG tablet Take 1 tablet (40 mg total) by mouth 2 (two) times daily. 08/17/22   Georganna Skeans, MD  rosuvastatin (CRESTOR) 10 MG tablet TAKE 1 TABLET (10 MG TOTAL) BY MOUTH DAILY FOR CHOLESTEROL Patient taking differently: Take 10 mg by mouth daily. 08/07/22   Georganna Skeans, MD  senna-docusate (SENOKOT-S) 8.6-50 MG tablet Take 1 tablet by mouth 2 (two) times daily between meals as needed for mild constipation. 08/14/22   Almon Hercules, MD  sevelamer carbonate (RENVELA) 2.4 g PACK Take 2.4 g by mouth 3 (three) times daily with meals. 03/06/22   Setzer, Lynnell Jude, PA-C  traZODone (DESYREL) 50 MG tablet Take 0.5 tablets (25 mg total) by mouth at bedtime as needed for sleep. 04/20/22   Georganna Skeans, MD      Allergies    Shrimp [shellfish allergy], Bactroban [mupirocin], Hydrocodone, Chlorhexidine gluconate, Tylenol [acetaminophen], and Zestril [lisinopril]    Review of Systems   Review of Systems  Physical Exam Updated Vital Signs BP (!) 176/96   Pulse 71   Temp 98.2 F (36.8 C) (Oral)   Resp 19  Ht 5\' 3"  (1.6 m)   Wt 46 kg   SpO2 98%   BMI 17.96 kg/m  Physical Exam Vitals and nursing note reviewed.  Constitutional:      General: She is not in acute distress.    Appearance: She is well-developed.  HENT:     Head: Normocephalic and atraumatic.     Mouth/Throat:     Mouth: Mucous membranes are moist.  Eyes:     General: Vision grossly intact. Gaze aligned appropriately.     Extraocular Movements: Extraocular movements intact.     Conjunctiva/sclera: Conjunctivae normal.  Cardiovascular:     Rate and Rhythm: Normal rate and regular rhythm.     Pulses: Normal pulses.     Heart sounds: Normal heart sounds, S1 normal and S2 normal. No  murmur heard.    No friction rub. No gallop.  Pulmonary:     Effort: Pulmonary effort is normal. Tachypnea present. No respiratory distress.     Breath sounds: Rhonchi and rales present.  Abdominal:     General: Bowel sounds are normal.     Palpations: Abdomen is soft.     Tenderness: There is no abdominal tenderness. There is no guarding or rebound.     Hernia: No hernia is present.  Musculoskeletal:        General: No swelling.     Cervical back: Full passive range of motion without pain, normal range of motion and neck supple. No spinous process tenderness or muscular tenderness. Normal range of motion.     Right lower leg: No edema.     Left lower leg: No edema.  Skin:    General: Skin is warm and dry.     Capillary Refill: Capillary refill takes less than 2 seconds.     Findings: No ecchymosis, erythema, rash or wound.  Neurological:     General: No focal deficit present.     Mental Status: She is alert and oriented to person, place, and time.     GCS: GCS eye subscore is 4. GCS verbal subscore is 5. GCS motor subscore is 6.     Cranial Nerves: Cranial nerves 2-12 are intact.     Sensory: Sensation is intact.     Motor: Motor function is intact.     Coordination: Coordination is intact.  Psychiatric:        Attention and Perception: Attention normal.        Mood and Affect: Mood normal.        Speech: Speech normal.        Behavior: Behavior normal.     ED Results / Procedures / Treatments   Labs (all labs ordered are listed, but only abnormal results are displayed) Labs Reviewed  COMPREHENSIVE METABOLIC PANEL - Abnormal; Notable for the following components:      Result Value   Potassium 5.7 (*)    Chloride 94 (*)    CO2 20 (*)    Glucose, Bld 104 (*)    BUN 127 (*)    Creatinine, Ser 19.00 (*)    Calcium 8.4 (*)    AST 14 (*)    GFR, Estimated 2 (*)    Anion gap 21 (*)    All other components within normal limits  CBC WITH DIFFERENTIAL/PLATELET - Abnormal;  Notable for the following components:   RBC 3.37 (*)    Hemoglobin 10.2 (*)    HCT 31.2 (*)    RDW 18.5 (*)    All other components  within normal limits  PROTIME-INR - Abnormal; Notable for the following components:   Prothrombin Time 15.3 (*)    All other components within normal limits  APTT - Abnormal; Notable for the following components:   aPTT 47 (*)    All other components within normal limits  MAGNESIUM - Abnormal; Notable for the following components:   Magnesium 2.6 (*)    All other components within normal limits  TROPONIN I (HIGH SENSITIVITY) - Abnormal; Notable for the following components:   Troponin I (High Sensitivity) 144 (*)    All other components within normal limits  RESP PANEL BY RT-PCR (RSV, FLU A&B, COVID)  RVPGX2  CULTURE, BLOOD (ROUTINE X 2)  CULTURE, BLOOD (ROUTINE X 2)  LACTIC ACID, PLASMA  TROPONIN I (HIGH SENSITIVITY)    EKG EKG Interpretation  Date/Time:  Friday September 25 2022 02:44:53 EDT Ventricular Rate:  79 PR Interval:  193 QRS Duration: 147 QT Interval:  459 QTC Calculation: 527 R Axis:   76 Text Interpretation: Sinus rhythm Right bundle branch block No significant change since last tracing Confirmed by Gilda Crease ([id number]) on 09/25/2022 2:53:56 AM  Radiology DG Chest Port 1 View  Result Date: 09/25/2022 CLINICAL DATA:  Cough and shortness of breath. EXAM: PORTABLE CHEST 1 VIEW COMPARISON:  August 11, 2022 FINDINGS: There is stable right-sided venous catheter positioning. Multiple sternal wires are seen. The cardiac silhouette is moderately enlarged and unchanged in size. Radiopaque surgical clips are seen overlying the right lung base. Both lungs are clear. The visualized skeletal structures are unremarkable. IMPRESSION: Stable cardiomegaly without evidence of acute or active cardiopulmonary disease. Electronically Signed   By: Aram Candela M.D.   On: 09/25/2022 03:14    Procedures Procedures    Medications Ordered in  ED Medications  calcium gluconate 1 g/ 50 mL sodium chloride IVPB (1,000 mg Intravenous New Bag/Given 09/25/22 0607)  sodium zirconium cyclosilicate (LOKELMA) packet 10 g (10 g Oral Given 09/25/22 0559)    ED Course/ Medical Decision Making/ A&P                             Medical Decision Making Amount and/or Complexity of Data Reviewed Labs: ordered. Radiology: ordered. ECG/medicine tests: ordered.  Risk Prescription drug management.   Differential Diagnosis considered includes, but not limited to: COVID-19; influenza; RSV; simple viral URI; pneumonia; volume overload  Patient presents to the emergency department for evaluation of shortness of breath.  She has had cough for the last few days and it seems to have worsened tonight.  Patient has been feeling poorly most of the week and is skipped multiple dialysis sessions.  Chest x-ray at arrival did not show any evidence of pneumonia.  No significant pulmonary edema.  She was placed on oxygen for comfort for oxygen saturations in the low 90s on room air.  Lab work reveals uremia and mild hyperkalemia without EKG changes.  Troponin is elevated but her baseline is elevated.  The slight increase today is consistent with her missed dialysis sessions, not felt to be associated with ACS.  Hyperkalemia has been treated.  Nephrology has been consulted, she will be dialyzed this morning.  She likely will be able to be discharged after dialysis.         Final Clinical Impression(s) / ED Diagnoses Final diagnoses:  Hypervolemia, unspecified hypervolemia type    Rx / DC Orders ED Discharge Orders     None  Gilda Crease, MD 09/25/22 [phone]

## 2022-09-25 NOTE — ED Notes (Signed)
Received report from dialysis. Pt  had 3L removed. It took 3 hours. Last vitals were 164/95  97% on 2L [place] 79 HR and 19 resp. Wasn't given any meds during dialysis.

## 2022-09-25 NOTE — ED Provider Notes (Signed)
  Physical Exam  BP (!) 180/86   Pulse 64   Temp 98.2 F (36.8 C) (Oral)   Resp 18   Ht 5\' 3"  (1.6 m)   Wt 46 kg   SpO2 99%   BMI 17.96 kg/m   Physical Exam  Procedures  Procedures  ED Course / MDM    Medical Decision Making Amount and/or Complexity of Data Reviewed Labs: ordered. Radiology: ordered. ECG/medicine tests: ordered.  Risk Prescription drug management.   Received care of patient from Dr. Oletta Cohn.  Please see his note for prior history, physical and care.  Presents with cough, congestion, shortness of breath and has missed dialysis this week.  Initial troponin more elevated in comparison to prior, second has similar degree of elevation.  While her troponins in March were lower, she was noted to have chronic troponin elevation similar to today on prior evaluations, consistently elevated in 2022 through 04/2022 with decrease during march evaluations on 3/12, 3/13. Suspect this is in setting of her ESRD.  Plan per Dr. Blinda Leatherwood is dialysis with tentative plan for discharge after dialysis.        Monique Monday, MD 09/25/22 [phone]

## 2022-09-26 LAB — COMPREHENSIVE METABOLIC PANEL
ALT: 7 U/L (ref 0–44)
AST: 14 U/L — ABNORMAL LOW (ref 15–41)
Albumin: 3.5 g/dL (ref 3.5–5.0)
Alkaline Phosphatase: 78 U/L (ref 38–126)
Anion gap: 21 — ABNORMAL HIGH (ref 5–15)
BUN: 127 mg/dL — ABNORMAL HIGH (ref 8–23)
CO2: 20 mmol/L — ABNORMAL LOW (ref 22–32)
Calcium: 8.4 mg/dL — ABNORMAL LOW (ref 8.9–10.3)
Chloride: 94 mmol/L — ABNORMAL LOW (ref 98–111)
Creatinine, Ser: 19 mg/dL — ABNORMAL HIGH (ref 0.44–1.00)
GFR, Estimated: 2 mL/min — ABNORMAL LOW (ref >60)
Glucose, Bld: 104 mg/dL — ABNORMAL HIGH (ref 70–99)
Potassium: 5.7 mmol/L — ABNORMAL HIGH (ref 3.5–5.1)
Sodium: 135 mmol/L (ref 135–145)
Total Bilirubin: 0.5 mg/dL (ref 0.3–1.2)
Total Protein: 6.9 g/dL (ref 6.5–8.1)

## 2022-09-26 LAB — HEPATITIS B SURFACE ANTIBODY, QUANTITATIVE: Hep B S AB Quant (Post): 76.7 m[IU]/mL (ref >9.9)

## 2022-09-28 ENCOUNTER — Ambulatory Visit: Payer: 59 | Admitting: Occupational Therapy

## 2022-09-30 ENCOUNTER — Ambulatory Visit: Payer: 59 | Admitting: Occupational Therapy

## 2022-09-30 LAB — CULTURE, BLOOD (ROUTINE X 2)
Culture: NO GROWTH
Culture: NO GROWTH
Special Requests: ADEQUATE
Special Requests: ADEQUATE

## 2022-10-05 ENCOUNTER — Other Ambulatory Visit (HOSPITAL_COMMUNITY): Payer: Self-pay

## 2022-10-08 ENCOUNTER — Other Ambulatory Visit: Payer: Self-pay | Admitting: Family Medicine

## 2022-10-08 NOTE — Telephone Encounter (Signed)
Requested Prescriptions  Pending Prescriptions Disp Refills   pantoprazole (PROTONIX) 40 MG tablet [Pharmacy Med Name: PANTOPRAZOLE SODIUM 40 MG ORAL TABLET DELAYED RELEASE] 60 tablet 1    Sig: TAKE 1 TABLET (40 MG TOTAL) BY MOUTH 2 (TWO) TIMES DAILY.     Gastroenterology: Proton Pump Inhibitors Passed - 10/08/2022  9:45 AM      Passed - Valid encounter within last 12 months    Recent Outpatient Visits           2 months ago Encounter for Medicare annual wellness exam   [facility], MD   4 months ago Essential hypertension   [facility], MD   5 months ago Essential hypertension   [facility], MD   6 months ago Essential hypertension    [facility] at [facility], Lauris Poag, MD   2 years ago Anemia due to stage 5 chronic kidney disease, not on chronic dialysis [facility])   [facility] at [facility], Gerlene Burdock, NP       Future Appointments             In 1 week Georganna Skeans, MD [facility]   In 1 month [place], Donnie Coffin, MD [facility], [id number]

## 2022-10-10 ENCOUNTER — Emergency Department (HOSPITAL_COMMUNITY)
Admission: EM | Admit: 2022-10-10 | Discharge: 2022-10-10 | Disposition: A | Discharge status: Home / Self Care | Payer: 59 | Attending: Emergency Medicine | Admitting: Emergency Medicine

## 2022-10-10 DIAGNOSIS — N186 End stage renal disease: Secondary | ICD-10-CM | POA: Diagnosis not present

## 2022-10-10 DIAGNOSIS — Z7901 Long term (current) use of anticoagulants: Secondary | ICD-10-CM | POA: Insufficient documentation

## 2022-10-10 DIAGNOSIS — Z79899 Other long term (current) drug therapy: Secondary | ICD-10-CM | POA: Insufficient documentation

## 2022-10-10 DIAGNOSIS — D631 Anemia in chronic kidney disease: Secondary | ICD-10-CM | POA: Diagnosis not present

## 2022-10-10 DIAGNOSIS — N189 Chronic kidney disease, unspecified: Secondary | ICD-10-CM

## 2022-10-10 DIAGNOSIS — Z992 Dependence on renal dialysis: Secondary | ICD-10-CM | POA: Diagnosis not present

## 2022-10-10 DIAGNOSIS — R079 Chest pain, unspecified: Secondary | ICD-10-CM | POA: Diagnosis present

## 2022-10-10 DIAGNOSIS — I480 Paroxysmal atrial fibrillation: Secondary | ICD-10-CM | POA: Insufficient documentation

## 2022-10-10 LAB — I-STAT CHEM 8, ED
BUN: 39 mg/dL — ABNORMAL HIGH (ref 8–23)
Calcium, Ion: 0.94 mmol/L — ABNORMAL LOW (ref 1.15–1.40)
Chloride: 103 mmol/L (ref 98–111)
Creatinine, Ser: 10.1 mg/dL — ABNORMAL HIGH (ref 0.44–1.00)
Glucose, Bld: 123 mg/dL — ABNORMAL HIGH (ref 70–99)
HCT: 33 % — ABNORMAL LOW (ref 36.0–46.0)
Hemoglobin: 11.2 g/dL — ABNORMAL LOW (ref 12.0–15.0)
Potassium: 3.8 mmol/L (ref 3.5–5.1)
Sodium: 142 mmol/L (ref 135–145)
TCO2: 28 mmol/L (ref 22–32)

## 2022-10-10 LAB — BASIC METABOLIC PANEL
Anion gap: 16 — ABNORMAL HIGH (ref 5–15)
BUN: 39 mg/dL — ABNORMAL HIGH (ref 8–23)
CO2: 27 mmol/L (ref 22–32)
Calcium: 9.4 mg/dL (ref 8.9–10.3)
Chloride: 99 mmol/L (ref 98–111)
Creatinine, Ser: 8.62 mg/dL — ABNORMAL HIGH (ref 0.44–1.00)
GFR, Estimated: 5 mL/min — ABNORMAL LOW (ref >60)
Glucose, Bld: 125 mg/dL — ABNORMAL HIGH (ref 70–99)
Potassium: 3.7 mmol/L (ref 3.5–5.1)
Sodium: 142 mmol/L (ref 135–145)

## 2022-10-10 LAB — CBC WITH DIFFERENTIAL/PLATELET
Abs Immature Granulocytes: 0.01 10*3/uL (ref 0.00–0.07)
Basophils Absolute: 0.1 10*3/uL (ref 0.0–0.1)
Basophils Relative: 1 %
Eosinophils Absolute: 0.3 10*3/uL (ref 0.0–0.5)
Eosinophils Relative: 3 %
HCT: 31.3 % — ABNORMAL LOW (ref 36.0–46.0)
Hemoglobin: 9.5 g/dL — ABNORMAL LOW (ref 12.0–15.0)
Immature Granulocytes: 0 %
Lymphocytes Relative: 9 %
Lymphs Abs: 0.7 10*3/uL (ref 0.7–4.0)
MCH: 28.9 pg (ref 26.0–34.0)
MCHC: 30.4 g/dL (ref 30.0–36.0)
MCV: 95.1 fL (ref 80.0–100.0)
Monocytes Absolute: 0.7 10*3/uL (ref 0.1–1.0)
Monocytes Relative: 10 %
Neutro Abs: 5.7 10*3/uL (ref 1.7–7.7)
Neutrophils Relative %: 77 %
Platelets: 197 10*3/uL (ref 150–400)
RBC: 3.29 MIL/uL — ABNORMAL LOW (ref 3.87–5.11)
RDW: 17.7 % — ABNORMAL HIGH (ref 11.5–15.5)
WBC: 7.4 10*3/uL (ref 4.0–10.5)
nRBC: 0 % (ref 0.0–0.2)

## 2022-10-10 LAB — MAGNESIUM: Magnesium: 2.2 mg/dL (ref 1.7–2.4)

## 2022-10-10 MED ORDER — PROPOFOL 10 MG/ML IV BOLUS
0.5000 mg/kg | Freq: Once | INTRAVENOUS | Status: AC
  Administered 2022-10-10: 23.5 mg via INTRAVENOUS
  Filled 2022-10-10: qty 20

## 2022-10-10 MED ORDER — PROPOFOL 10 MG/ML IV BOLUS
INTRAVENOUS | Status: DC | PRN
  Administered 2022-10-10: 23.5 mg via INTRAVENOUS

## 2022-10-10 NOTE — Discharge Instructions (Signed)
Go for your dialysis today! °

## 2022-10-10 NOTE — Sedation Documentation (Addendum)
At bedside:  Dione Booze MD Theadora Rama RN Armanda Heritage RN [place] Respiratory Therapist

## 2022-10-10 NOTE — Sedation Documentation (Signed)
Patient given 120 joules synchronized cardioversion. Shock administered by Dione Booze MD. Patient converted to sinus rhythm. EKG was done immediately. Patient resting.

## 2022-10-10 NOTE — ED Notes (Signed)
Discussed with Dr. Adela Lank about pt's BP prior to discharge. Advised okay to discharge and advise pt to take home BP mediations as soon as she gets home. Pt asymptomatic

## 2022-10-10 NOTE — ED Provider Notes (Signed)
[facility] AT [facility] Provider Note   CSN: [id number] Arrival date & time: 10/10/22  8295     History {Add pertinent medical, surgical, social history, OB history to HPI:1} Chief Complaint  Patient presents with   Chest Pain    Patient to ED via [facility] with complaint of chest pain described sas sharp radiating into back and left arm  x 1 day with associated shortness of breath Hx of A-fib 80 -120. 91% on room air. Patient arrives at 99% on 2 liters nasal canula O2 BP 226/148 CBG 146 EMS gave 324 asa. Patient refused nitro and refused IV    Monique Henderson is a 72 y.o. female.  The history is provided by the patient.  Chest Pain She has history of hypertension, end-stage renal disease on hemodialysis, stroke, heart failure with reduced ejection fraction, paroxysmal atrial fibrillation anticoagulated on apixaban and comes in because of chest tightness and shortness of breath that started yesterday morning.  She did note her heart was racing.  She states she has been compliant with all of her medications and has not missed any dialysis sessions.  She is due for dialysis this morning.   Home Medications Prior to Admission medications   Medication Sig Start Date End Date Taking? Authorizing Provider  albuterol (VENTOLIN HFA) 108 (90 Base) MCG/ACT inhaler Inhale 2 puffs into the lungs every 6 (six) hours as needed for wheezing or shortness of breath. 07/30/20   Janeece Agee, NP  amLODipine (NORVASC) 10 MG tablet Take 1 tablet (10 mg total) by mouth daily. 03/06/22   Setzer, Lynnell Jude, PA-C  apixaban (ELIQUIS) 2.5 MG TABS tablet Take 1 tablet (2.5 mg total) by mouth 2 (two) times daily. 03/06/22   Setzer, Lynnell Jude, PA-C  carvedilol (COREG) 25 MG tablet Take 1 tablet (25 mg total) by mouth 2 (two) times daily with a meal. Patient taking differently: Take 12.5 mg by mouth 2 (two) times daily with a meal. 05/01/22 05/02/23  Ghimire, Werner Lean, MD  Cholecalciferol  (VITAMIN D-3 PO) Take 1 capsule by mouth daily.    [provider]  cloNIDine (CATAPRES - DOSED IN MG/24 HR) 0.2 mg/24hr patch Place 1 patch (0.2 mg total) onto the skin once a week. 08/18/22 02/16/23  Almon Hercules, MD  hydrALAZINE (APRESOLINE) 25 MG tablet Take 3 tablets (75 mg total) by mouth every 8 (eight) hours. 08/14/22 02/10/23  Almon Hercules, MD  isosorbide mononitrate (IMDUR) 30 MG 24 hr tablet Take 1 tablet (30 mg total) by mouth daily. 04/20/22   Georganna Skeans, MD  naloxone [facility]) nasal spray 4 mg/0.1 mL 1 spray as directed. 02/19/22   [provider]  oxyCODONE-acetaminophen (PERCOCET) 10-325 MG tablet Take 1 tablet by mouth 3 (three) times daily as needed for pain. 03/12/22   [provider]  pantoprazole (PROTONIX) 40 MG tablet TAKE 1 TABLET (40 MG TOTAL) BY MOUTH 2 (TWO) TIMES DAILY. 10/08/22   Georganna Skeans, MD  rosuvastatin (CRESTOR) 10 MG tablet TAKE 1 TABLET (10 MG TOTAL) BY MOUTH DAILY FOR CHOLESTEROL Patient taking differently: Take 10 mg by mouth daily. 08/07/22   Georganna Skeans, MD  senna-docusate (SENOKOT-S) 8.6-50 MG tablet Take 1 tablet by mouth 2 (two) times daily between meals as needed for mild constipation. 08/14/22   Almon Hercules, MD  sevelamer carbonate (RENVELA) 2.4 g PACK Take 2.4 g by mouth 3 (three) times daily with meals. 03/06/22   Setzer, Lynnell Jude, PA-C  traZODone (  DESYREL) 50 MG tablet Take 0.5 tablets (25 mg total) by mouth at bedtime as needed for sleep. 04/20/22   Georganna Skeans, MD      Allergies    Shrimp [shellfish allergy], Bactroban [mupirocin], Hydrocodone, Chlorhexidine gluconate, Tylenol [acetaminophen], and Zestril [lisinopril]    Review of Systems   Review of Systems  Cardiovascular:  Positive for chest pain.  All other systems reviewed and are negative.   Physical Exam Updated Vital Signs BP (!) 196/135   Pulse (!) 130   Temp 98.4 F (36.9 C) (Oral)   Resp (!) 26   Ht 5\' 3"  (1.6 m)   Wt 47 kg   SpO2 99%   BMI  18.35 kg/m  Physical Exam Vitals and nursing note reviewed.   72 year old female, resting comfortably and in no acute distress. Vital signs are significant for rapid heart rate and respiratory rate and elevated blood pressure. Oxygen saturation is 99%, which is normal. Head is normocephalic and atraumatic. PERRLA, EOMI. Oropharynx is clear. Neck is nontender and supple without adenopathy or JVD. Back is nontender and there is no CVA tenderness. Lungs are clear without rales, wheezes, or rhonchi. Chest is nontender.  Dialysis access catheters present in the right subclavian. Heart is tachycardic and irregular without murmur. Abdomen is soft, flat, nontender without masses or hepatosplenomegaly and peristalsis is normoactive. Extremities have no cyanosis or edema, full range of motion is present. Skin is warm and dry without rash. Neurologic: Mental status is normal, cranial nerves are intact, moves all extremities equally.  ED Results / Procedures / Treatments   Labs (all labs ordered are listed, but only abnormal results are displayed) Labs Reviewed  I-STAT CHEM 8, ED - Abnormal; Notable for the following components:      Result Value   BUN 39 (*)    Creatinine, Ser 10.10 (*)    Glucose, Bld 123 (*)    Calcium, Ion 0.94 (*)    Hemoglobin 11.2 (*)    HCT 33.0 (*)    All other components within normal limits    EKG EKG Interpretation  Date/Time:  Saturday Oct 10 2022 05:24:25 EDT Ventricular Rate:  140 PR Interval:    QRS Duration: 136 QT Interval:  329 QTC Calculation: 503 R Axis:   77 Text Interpretation: Atrial fibrillation with rapid ventricular response Right bundle branch block When compared with ECG of 09/25/2022, Atrial fibrillation with rapid ventricular response has replaced Sinus rhythm Confirmed by Dione Booze ([id number]) on 10/10/2022 5:31:14 AM  Radiology No results found.  Procedures .Cardioversion  Date/Time: 10/10/2022 6:26 AM  Performed by: Dione Booze,  MD Authorized by: Dione Booze, MD   Consent:    Consent obtained:  Written   Consent given by:  Patient   Risks discussed:  Cutaneous burn, death and induced arrhythmia   Alternatives discussed:  Rate-control medication Universal protocol:    Procedure explained and questions answered to patient or proxy's satisfaction: yes     Relevant documents present and verified: yes     Test results available and properly labeled: yes     Required blood products, implants, devices, and special equipment available: yes     Site/side marked: yes     Immediately prior to procedure a time out was called: yes     Patient identity confirmed:  Verbally with patient and arm band Pre-procedure details:    Cardioversion basis:  Emergent   Rhythm:  Atrial fibrillation   Electrode placement:  Anterior-posterior Patient sedated: Yes.  Refer to sedation procedure documentation for details of sedation.  Attempt one:    Cardioversion mode:  Synchronous   Waveform:  Biphasic   Shock (Joules):  120   Shock outcome:  Conversion to normal sinus rhythm Post-procedure details:    Patient status:  Awake   Patient tolerance of procedure:  Tolerated well, no immediate complications .Sedation  Date/Time: 10/10/2022 6:45 AM  Performed by: Dione Booze, MD Authorized by: Dione Booze, MD   Consent:    Consent obtained:  Written   Consent given by:  Patient   Risks discussed:  Allergic reaction, prolonged hypoxia resulting in organ damage, respiratory compromise necessitating ventilatory assistance and intubation, inadequate sedation, nausea and vomiting   Alternatives discussed:  Anxiolysis Universal protocol:    Procedure explained and questions answered to patient or proxy's satisfaction: yes     Relevant documents present and verified: yes     Test results available: yes     Required blood products, implants, devices, and special equipment available: yes     Site/side marked: yes     Immediately prior to  procedure, a time out was called: yes     Patient identity confirmed:  Verbally with patient and arm band Indications:    Procedure performed:  Cardioversion   Procedure necessitating sedation performed by:  Physician performing sedation Pre-sedation assessment:    Time since last food or drink:  8 hours   ASA classification: class 3 - patient with severe systemic disease     Mouth opening:  2 finger widths   Thyromental distance:  3 finger widths   Mallampati score:  II - soft palate, uvula, fauces visible   Neck mobility: normal     Pre-sedation assessments completed and reviewed: airway patency, cardiovascular function, hydration status, mental status, pain level, respiratory function and temperature     Pre-sedation assessments completed and reviewed: pre-procedure nausea and vomiting status not reviewed     Pre-sedation assessment completed:  10/10/2022 6:15 AM Immediate pre-procedure details:    Reassessment: Patient reassessed immediately prior to procedure     Reviewed: vital signs, relevant labs/tests and NPO status     Verified: bag valve mask available, emergency equipment available, intubation equipment available, IV patency confirmed, oxygen available and suction available   Procedure details (see MAR for exact dosages):    Preoxygenation:  Nasal cannula   Sedation:  Propofol   Intended level of sedation: deep   Analgesia:  None   Intra-procedure monitoring:  Blood pressure monitoring, continuous capnometry, frequent LOC assessments, frequent vital sign checks, continuous pulse oximetry and cardiac monitor   Intra-procedure events: none     Total Provider sedation time (minutes):  30 Post-procedure details:    Post-sedation assessment completed:  10/10/2022 6:45 AM   Attendance: Constant attendance by certified staff until patient recovered     Recovery: Patient returned to pre-procedure baseline     Post-sedation assessments completed and reviewed: airway patency,  cardiovascular function, hydration status, mental status, nausea/vomiting, pain level, respiratory function and temperature     Patient is stable for discharge or admission: yes     Procedure completion:  Tolerated well, no immediate complications   Cardiac monitor shows atrial fibrillation with rapid ventricular response, per my interpretation.  Medications Ordered in ED Medications  propofol (DIPRIVAN) 10 mg/mL bolus/IV push 23.5 mg (has no administration in time range)    ED Course/ Medical Decision Making/ A&P   {   Click here for ABCD2, HEART and other calculatorsREFRESH Note  before signing :1}      CHA2DS2-VASc Score: 7                    Medical Decision Making Amount and/or Complexity of Data Reviewed Labs: ordered. ECG/medicine tests: ordered.   Chest discomfort and dyspnea secondary to atrial fibrillation with rapid ventricular response.  I have reviewed her past records, and she had been admitted to the hospital 01/29/2022 with atrial fibrillation with rapid ventricular response.  She is anticoagulated, so I feel that the most appropriate course would be electrical cardioversion as opposed to rate control.  Patient successfully cardioverted to sinus rhythm.  She is feeling much better.  I have reviewed her laboratory tests and my interpretation is stable anemia, renal failure, elevated random glucose.  She is discharged to go to her dialysis session later today.  CHA2DS2-VASc score is 7 indicating high risk of stroke if not anticoagulated, patient is to continue her current apixaban.  CHA2DS2/VAS Stroke Risk Points  Current as of just now     7 >= 2 Points: High Risk  1 - 1.99 Points: Medium Risk  0 Points: Low Risk    Last Change: N/A      Details    This score determines the patient's risk of having a stroke if the  patient has atrial fibrillation.       Points Metrics  1 Has Congestive Heart Failure:  Yes    Current as of just now  1 Has Vascular Disease:  Yes     Current as of just now  1 Has Hypertension:  Yes    Current as of just now  1 Age:  54    Current as of just now  0 Has Diabetes:  No    Current as of just now  2 Had Stroke:  Yes  Had TIA:  No  Had Thromboembolism:  No    Current as of just now  1 Female:  Yes    Current as of just now     CRITICAL CARE Performed by: Dione Booze Total critical care time: 45 minutes Critical care time was exclusive of separately billable procedures and treating other patients. Critical care was necessary to treat or prevent imminent or life-threatening deterioration. Critical care was time spent personally by me on the following activities: development of treatment plan with patient and/or surrogate as well as nursing, discussions with consultants, evaluation of patient's response to treatment, examination of patient, obtaining history from patient or surrogate, ordering and performing treatments and interventions, ordering and review of laboratory studies, ordering and review of radiographic studies, pulse oximetry and re-evaluation of patient's condition.  Final Clinical Impression(s) / ED Diagnoses Final diagnoses:  Paroxysmal atrial fibrillation (HCC)  Chronic anticoagulation  End-stage renal disease on hemodialysis (HCC)  Anemia associated with chronic renal failure    Rx / DC Orders ED Discharge Orders          Ordered    Amb referral to [facility] Clinic        10/10/22 0550

## 2022-10-21 ENCOUNTER — Ambulatory Visit (HOSPITAL_COMMUNITY)
Admission: RE | Admit: 2022-10-21 | Discharge: 2022-10-21 | Disposition: A | Discharge status: Home / Self Care | Payer: 59 | Source: Ambulatory Visit | Attending: Physician Assistant | Admitting: Physician Assistant

## 2022-10-21 ENCOUNTER — Ambulatory Visit: Payer: 59 | Admitting: Family Medicine

## 2022-10-21 VITALS — BP 142/78 | HR 78 | Ht 63.0 in | Wt 104.2 lb

## 2022-10-21 DIAGNOSIS — I480 Paroxysmal atrial fibrillation: Secondary | ICD-10-CM | POA: Insufficient documentation

## 2022-10-21 DIAGNOSIS — I4892 Unspecified atrial flutter: Secondary | ICD-10-CM | POA: Diagnosis not present

## 2022-10-21 DIAGNOSIS — N186 End stage renal disease: Secondary | ICD-10-CM | POA: Insufficient documentation

## 2022-10-21 DIAGNOSIS — I5022 Chronic systolic (congestive) heart failure: Secondary | ICD-10-CM | POA: Insufficient documentation

## 2022-10-21 DIAGNOSIS — I132 Hypertensive heart and chronic kidney disease with heart failure and with stage 5 chronic kidney disease, or end stage renal disease: Secondary | ICD-10-CM | POA: Insufficient documentation

## 2022-10-21 DIAGNOSIS — D6869 Other thrombophilia: Secondary | ICD-10-CM | POA: Diagnosis not present

## 2022-10-21 DIAGNOSIS — Z7901 Long term (current) use of anticoagulants: Secondary | ICD-10-CM | POA: Insufficient documentation

## 2022-10-21 NOTE — Progress Notes (Signed)
Primary Care Physician: Georganna Skeans, MD Primary Cardiologist: previously Dr Katrinka Blazing, new to Dr Eldridge Dace Primary Electrophysiologist: none Referring Physician: [facility]   Monique Henderson is a 72 y.o. 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  The patient was initially diagnosed with atrial fibrillation remotely. Patient is on Eliquis for a CHADS2VASC score of 7.  She was seen at the ED 10/10/22 with chest discomfort and SOB. She was found to be in afib with rapid rates and underwent DCCV.  On follow up today, patient reports that she feels well today and remains in SR. She has been compliant with her medications and HD visits. No bleeding issues on anticoagulation. There were no specific triggers for her afib that she could identify. She reports very rare and brief palpitations over the last several years.   Today, she denies symptoms of palpitations, chest pain, shortness of breath, orthopnea, PND, lower extremity edema, dizziness, presyncope, syncope, snoring, daytime somnolence, bleeding, or neurologic sequela. The patient is tolerating medications without difficulties and is otherwise without complaint today.    Atrial Fibrillation Risk Factors:  she does not have symptoms or diagnosis of sleep apnea. she does not have a history of rheumatic fever.   she has a BMI of Body mass index is 18.46 kg/m.Marland Kitchen Filed Weights   10/21/22 1014  Weight: 47.3 kg    Family History  Problem Relation Age of Onset   Colon cancer Mother 20   Hypertension Mother    Diabetes Sister 15   Hypertension Sister    Diabetes Brother    Hypertension  Brother    Diabetes Brother    Hypertension Brother    Kidney disease Son        s/p renal transplant   Hypertension Son    Diabetes Son    Multiple sclerosis Son    Bone cancer Sister 19   Breast cancer Neg Hx    Cervical cancer Neg Hx      Atrial Fibrillation Management history:  Previous antiarrhythmic drugs: none Previous cardioversions: 10/10/22 Previous ablations: none Anticoagulation history: warfarin, Eliquis   Past Medical History:  Diagnosis Date   AF (paroxysmal atrial fibrillation) (HCC) 05/29/2019   on Coumadin   Aortic atherosclerosis (HCC) 07/05/2019   Aortic dissection (HCC) 04/04/2019   s/p repair   Bone spur 2008   Right calcaneal foot spur   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 Chevis Pretty; S/P re-excision of superior and inferior margins 10/27/2012.    ESRD on hemodialysis (HCC) 05/29/2019   Essential hypertension 09/16/2006   GERD 09/16/2006   Hepatitis C    treated and RNA confirmed not detectable 01/2017   Insomnia 03/14/2015   Malnutrition of moderate degree 05/19/2019   Non  compliance with medical treatment 12/04/2017   Normocytic anemia    With thrombocytosis   Osteoarthritis    Right ureteral stone 2002   S/P lumbar spinal fusion 01/18/2014   S/P lumbar decompressive laminectomy, fusion, and plating for lumbar spinal stenosis on 05/27/2009 by Dr. Tia Alert.  S/P anterolateral retroperitoneal interbody fusion L2-3 utilizing a 8 mm peek interbody cage packed  with morcellized allograft, and anterior lumbar plating L2-3 for recurrent disc herniation L2-3 with spinal stenosis on 01/18/2014 by Dr. Tia Alert.     Stroke (cerebrum) (HCC)    Tobacco use disorder 04/19/2009   Uterine fibroid    Wears dentures    top   Past Surgical History:  Procedure Laterality Date   ANTERIOR LAT LUMBAR FUSION N/A 01/18/2014   Procedure: ANTERIOR LATERAL LUMBAR FUSION LUMBAR TWO-THREE;  Surgeon: Tia Alert, MD;  Location: [facility];  Service: Neurosurgery;  Laterality: N/A;   ANTERIOR LUMBAR FUSION  01/18/2014   AV FISTULA PLACEMENT Left 04/20/2019   Procedure: ARTERIOVENOUS (AV) FISTULA CREATION;  Surgeon: Maeola Harman, MD;  Location: [facility] OR;  Service: Vascular;  Laterality: Left;   BACK SURGERY     BREAST LUMPECTOMY Left 01/2003   BREAST LUMPECTOMY Right 2014   BREAST LUMPECTOMY WITH NEEDLE LOCALIZATION AND AXILLARY SENTINEL LYMPH NODE BX Right 10/13/2012   Procedure: BREAST LUMPECTOMY WITH NEEDLE LOCALIZATION;  Surgeon: Robyne Askew, MD;  Location: [facility];  Service: General;  Laterality: Right;  Right breast wire localized lumpectomy   INSERTION OF DIALYSIS CATHETER Right 04/20/2019   Procedure: INSERTION OF DIALYSIS CATHETER, right internal jugular;  Surgeon: Maeola Harman, MD;  Location: [facility] OR;  Service: Vascular;  Laterality: Right;   INSERTION OF DIALYSIS CATHETER Right 09/24/2020   Procedure: INSERTION OF TUNNELED DIALYSIS CATHETER;  Surgeon: Larina Earthly, MD;  Location: [facility] OR;  Service: Vascular;  Laterality: Right;   IR FLUORO GUIDE CV LINE RIGHT  09/22/2020   IR THORACENTESIS ASP PLEURAL SPACE W/IMG GUIDE  05/19/2019   IR US GUIDE VASC ACCESS LEFT  09/22/2020   IR US GUIDE VASC ACCESS RIGHT  09/22/2020   IR VENOCAVAGRAM SVC  09/22/2020   LAMINECTOMY  05/27/2009   Lumbar decompressive laminectomy, fusion and plating for lumbar spinal stensosis   LIGATION OF ARTERIOVENOUS  FISTULA Left 09/24/2020    Procedure: LIGATION OF LEFT ARM ARTERIOVENOUS  FISTULA;  Surgeon: Larina Earthly, MD;  Location: [facility] OR;  Service: Vascular;  Laterality: Left;   LUMBAR LAMINECTOMY/DECOMPRESSION MICRODISCECTOMY Left 03/23/2013   Procedure: LUMBAR LAMINECTOMY/DECOMPRESSION MICRODISCECTOMY 1 LEVEL;  Surgeon: Tia Alert, MD;  Location: [facility];  Service: Neurosurgery;  Laterality: Left;  LUMBAR LAMINECTOMY/DECOMPRESSION MICRODISCECTOMY 1 LEVEL   MASTECTOMY, PARTIAL Left 02/26/2003   ; S/P re-excision of cranial and lateral margins 04/19/2003.    RE-EXCISION OF BREAST CANCER,SUPERIOR MARGINS Right 10/27/2012   Procedure: RE-EXCISION OF BREAST CANCER,SUPERIOR and inferior MARGINS;  Surgeon: Robyne Askew, MD;  Location: [facility] OR;  Service: General;  Laterality: Right;   RE-EXCISION OF BREAST LUMPECTOMY Left 04/2003   TEE WITHOUT CARDIOVERSION N/A 04/04/2019   Procedure: Transesophageal Echocardiogram (Tee);  Surgeon: Linden Dolin, MD;  Location: [facility] OR;  Service: Open Heart Surgery;  Laterality: N/A;   THORACIC AORTIC ANEURYSM REPAIR N/A 04/04/2019   Procedure: THORACIC ASCENDING ANEURYSM REPAIR (AAA)  USING 28 MM X 30 CM HEMASHIELD PLATINUM VASCULAR GRAFT;  Surgeon: Linden Dolin, MD;  Location: [facility] OR;  Service:  Open Heart Surgery;  Laterality: N/A;    Current Outpatient Medications  Medication Sig Dispense Refill   albuterol (VENTOLIN HFA) 108 (90 Base) MCG/ACT inhaler Inhale 2 puffs into the lungs every 6 (six) hours as needed for wheezing or shortness of breath. 8.5 g 0   amLODipine (NORVASC) 10 MG tablet Take 1 tablet (10 mg total) by mouth daily. 30 tablet 0   apixaban (ELIQUIS) 2.5 MG TABS tablet Take 1 tablet (2.5 mg total) by mouth 2 (two) times daily. 60 tablet 0   carvedilol (COREG) 25 MG tablet Take 1 tablet (25 mg total) by mouth 2 (two) times daily with a meal. (Patient taking differently: Take 12.5 mg by mouth 2 (two) times daily with a meal.) 60 tablet 1   Cholecalciferol (VITAMIN D-3 PO)  Take 1 capsule by mouth daily.     cloNIDine (CATAPRES - DOSED IN MG/24 HR) 0.2 mg/24hr patch Place 1 patch (0.2 mg total) onto the skin once a week. 13 patch 1   hydrALAZINE (APRESOLINE) 25 MG tablet Take 3 tablets (75 mg total) by mouth every 8 (eight) hours. 810 tablet 1   isosorbide mononitrate (IMDUR) 30 MG 24 hr tablet Take 1 tablet (30 mg total) by mouth daily. 90 tablet 1   naloxone (NARCAN) nasal spray 4 mg/0.1 mL 1 spray as directed.     oxyCODONE-acetaminophen (PERCOCET) 10-325 MG tablet Take 1 tablet by mouth 3 (three) times daily as needed for pain.     pantoprazole (PROTONIX) 40 MG tablet TAKE 1 TABLET (40 MG TOTAL) BY MOUTH 2 (TWO) TIMES DAILY. 60 tablet 1   rosuvastatin (CRESTOR) 10 MG tablet TAKE 1 TABLET (10 MG TOTAL) BY MOUTH DAILY FOR CHOLESTEROL (Patient taking differently: Take 10 mg by mouth daily.) 90 tablet 0   senna-docusate (SENOKOT-S) 8.6-50 MG tablet Take 1 tablet by mouth 2 (two) times daily between meals as needed for mild constipation. 180 tablet 0   sevelamer carbonate (RENVELA) 2.4 g PACK Take 2.4 g by mouth 3 (three) times daily with meals. 90 each 0   traZODone (DESYREL) 50 MG tablet Take 0.5 tablets (25 mg total) by mouth at bedtime as needed for sleep. 45 tablet 0   No current facility-administered medications for this encounter.    Allergies  Allergen Reactions   Shrimp [Shellfish Allergy] Shortness Of Breath   Bactroban [Mupirocin] Other (See Comments)    "Sores in nose"   Hydrocodone Itching, Nausea Only and Nausea And Vomiting   Chlorhexidine Gluconate Itching   Tylenol [Acetaminophen] Itching   Zestril [Lisinopril] Cough    Social History   Socioeconomic History   Marital status: Single    Spouse name: Not on file   Number of children: 2   Years of education: Not on file   Highest education level: Not on file  Occupational History   Occupation: disabled  Tobacco Use   Smoking status: Former    Packs/day: 0.25    Years: 44.00     Additional pack years: 0.00    Total pack years: 11.00    Types: Cigarettes    Quit date: 2022    Years since quitting: 2.3   Smokeless tobacco: Never   Tobacco comments:    smoking less  Vaping Use   Vaping Use: Never used  Substance and Sexual Activity   Alcohol use: Not Currently    Alcohol/week: 2.0 standard drinks of alcohol    Types: 2 Cans of beer per week    Comment: "couple beers  a weekend", not currently   Drug use: Not Currently    Types: Cocaine    Comment: last in 2005   Sexual activity: Yes    Birth control/protection: Post-menopausal  Other Topics Concern   Not on file  Social History Narrative   Not on file   Social Determinants of Health   Financial Resource Strain: Not on file  Food Insecurity: No Food Insecurity (04/30/2022)   Hunger Vital Sign    Worried About Running Out of Food in the Last Year: Never true    Ran Out of Food in the Last Year: Never true  Transportation Needs: No Transportation Needs (04/30/2022)   PRAPARE - Administrator, Civil Service (Medical): No    Lack of Transportation (Non-Medical): No  Physical Activity: Not on file  Stress: Not on file  Social Connections: Not on file  Intimate Partner Violence: Not At Risk (04/30/2022)   Humiliation, Afraid, Rape, and Kick questionnaire    Fear of Current or Ex-Partner: No    Emotionally Abused: No    Physically Abused: No    Sexually Abused: No     ROS- All systems are reviewed and negative except as per the HPI above.  Physical Exam: Vitals:   10/21/22 1014  BP: (!) 162/76  Pulse: 78  Weight: 47.3 kg  Height: 5\' 3"  (1.6 m)    GEN- The patient is a well appearing female, alert and oriented x 3 today.   Head- normocephalic, atraumatic Eyes-  Sclera clear, conjunctiva pink Ears- hearing intact Oropharynx- clear Neck- supple  Lungs- Clear to ausculation bilaterally, normal work of breathing Heart- Regular rate and rhythm, no murmurs, rubs or gallops  GI-  soft, NT, ND, + BS Extremities- no clubbing, cyanosis, or edema MS- no significant deformity or atrophy Skin- no rash or lesion Psych- euthymic mood, full affect Neuro- strength and sensation are intact  Wt Readings from Last 3 Encounters:  10/21/22 47.3 kg  10/10/22 47 kg  09/25/22 46 kg    EKG today demonstrates  SR, RBBB Vent. rate 78 BPM PR interval 176 ms QRS duration 144 ms QT/QTcB 480/547 ms   Echo 06/19/22 demonstrated  1. Distal septal hypokinesis consider bringing patient back for  TDI/strain imaging Consider further w/u for amyloid. Left ventricular  ejection fraction, by estimation, is 45 to 50%. The left ventricle has  mildly decreased function. The left ventricle has no regional wall motion abnormalities. There is severe left ventricular hypertrophy. Left ventricular diastolic parameters are consistent with Grade II diastolic dysfunction (pseudonormalization). Elevated left ventricular end-diastolic pressure.   2. Right ventricular systolic function is normal. The right ventricular  size is normal.   3. Left atrial size was mildly dilated.   4. The mitral valve is abnormal. Mild mitral valve regurgitation. No  evidence of mitral stenosis.   5. Tricuspid valve regurgitation is moderate.   6. The aortic valve is tricuspid. There is moderate thickening of the  aortic valve. Aortic valve regurgitation is not visualized. No aortic  stenosis is present.   7. The inferior vena cava is normal in size with greater than 50%  respiratory variability, suggesting right atrial pressure of 3 mmHg.    Epic records are reviewed at length today.  CHA2DS2-VASc Score = 7  The patient's score is based upon: CHF History: 1 HTN History: 1 Diabetes History: 0 Stroke History: 2 Vascular Disease History: 1 Age Score: 1 Gender Score: 1       ASSESSMENT AND  PLAN: 1. Paroxysmal Atrial Fibrillation/atrial flutter The patient's CHA2DS2-VASc score is 7, indicating a 11.2% annual  risk of stroke.   S/p DCCV in the ED 10/10/22 Patient appears to be maintaining SR. She reports very infrequent symptoms. If she has more frequent/persistent afib, would consider AAD. She has been compliant with her HD and medication regimen recently.  With ESRD, her only AAD option would be amiodarone.  Continue Eliquis 2.5 mg BID (weight <60kg, Cr >1.5) Continue carvedilol 25 mg BID  2. Secondary Hypercoagulable State (ICD10:  D68.69) The patient is at significant risk for stroke/thromboembolism based upon her CHA2DS2-VASc Score of 7.  Continue Apixaban (Eliquis).   3. ESRD On HD Tu/Th/Sa  4. Chronic HFrEF EF improved to 45-50% ? Amyloid, defer testing to primary cardiology team. Fluid status appears stable today.  5. HTN Initially elevated, improved on recheck No changes today.   Follow up with Dr Eldridge Dace as scheduled. AF clinic in 6 months.    Jorja Loa PA-C Afib Clinic [facility] [address] [place], [place] [address] [phone] 10/21/2022 11:39 AM

## 2022-11-12 NOTE — Patient Instructions (Signed)
Below is our plan:  We will continue to monitor. Continue close follow up with your care team. Let us know if you need [place].   Please make sure you are staying well hydrated. I recommend 50-60 ounces daily. Well balanced diet and regular exercise encouraged. Consistent sleep schedule with 6-8 hours recommended.   Please continue follow up with care team as directed.   Follow up with me as needed   You may receive a survey regarding today's visit. I encourage you to leave honest feed back as I do use this information to improve patient care. Thank you for seeing me today!

## 2022-11-12 NOTE — Progress Notes (Addendum)
Chief Complaint  Patient presents with   Follow-up    Pt in room 1, sister in room. Here for CVA follow up. Pt reports doing well. Pt was seen ER in May due to breathing. Pt has a new PCP doctor Dr. Verlon Setting. Avbuere.    HISTORY OF PRESENT ILLNESS:  11/16/22 ALL:  Monique Henderson is a 72 y.o. female here today for follow up for history of CVA. She suffered left posterior frontal, parietal lobe small acute infarct in left caudate head, left caudate tail of the left occipital lobe and left frontal lobe 01/2022. She was last seen by Dr Pearlean Brownie 05/2022 with continued right hand weakness and dysarthria. Since, she reports doing well. Her sister, Monique Henderson, aides in history. Monique Henderson continues to have mild right hand weakness and some dysarthria. She feels symptoms are significant improved from last visit. She completed PT and ST. She continues HEP. She denies limitations with hand weakness. No concerns with gait. No falls.   She was seen in ER 10/10/2022 for shob and chest pain. She underwent cardioversion with Dr Preston Fleeting. Converted to sinus. CHA2DS2-VASc 7. She continues Eliquis 2.5mg  BID. She was referred to afib clinic. She continues rosuvastatin 10mg  daily. She continues dialysis Tu, TH, Sa. She is now followed by Dr Concepcion Elk, PCP. First appt was last week. She has follow up in a couple months. BP is usually less than 140/90 at home. She reports some lower readings at dialysis. Labs recently drawn and lipids reportedly normal.   She lives alone. Her sisters, son and significant other help take care of her. She is able to perform ADLs independently. Her sisters help with medication dosing. She is able to take them at designated times. No concerns with memory.   HISTORY (copied from Dr Marlis Edelson previous note)  HPI: Monique Henderson is a 72 year old African-American lady seen today for initial office follow-up visit following hospital consultation for stroke in September 2023.  She is accompanied by her  sister.  History is obtained from them and review of electronic medical records and I personally reviewed pertinent available imaging films in PACS. Monique Henderson is a 72 y.o. 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  Initially, last known well was unclear as patient was adamant that she was well before bed last night at 21:00 and when she woke up at 07:00, her deficits were present.  Patient's sister via telephone noted that the patient has a friend that stays with her that reported her symptoms were present upon waking from sleep on 02/22/22 with a last known well of 9/23.  Patient refused to go to the hospital and be evaluated yesterday but since the symptoms persisted today she decided to be evaluated.  On further evaluation, patient did admit that her symptoms were present yesterday on 9/24.  Her last dose of Eliquis was 9/24.  Some history limited from patient due to severe dysarthria.  While in triage, patient's blood glucose was obtained at 106 and her blood pressure was elevated to 240/150.  She was not TNK candidate as she presented outside the window.  He is not an LVO candidate instrumentation and not consistent with LVO.  CT head showed left frontal hypoattenuation possibly representing acute infarct and MRI showed acute infarct in the left posterior frontal, parietal lobe small acute infarct in left caudate head, left caudate tail of the left occipital lobe and left frontal lobe.  MRA of the head and neck was motion  limited but showed no LVO or significant disease.  2D echo showed ejection fraction of 30 to 35% with global hypokinesia severe dilatation of left atrium.  LDL cholesterol 46 mg percent and hemoglobin A1c was 4.7.  Patient was started on Eliquis and counseled to be compliant taking it.  He was discharged home and is currently doing outpatient therapy treatment therapy.  Still has dysarthria and some expressive language difficulties but can be understood if she speaks slowly and loudly.  She still has significant weakness in right hand grip and intrinsic hand muscles..  She is able to walk without a cane or walker but does drag her right foot.  She has had no falls or injuries.  She is not compliant with taking Eliquis 2.5 mg twice daily not had any bleeding or bruising.  She was rehospitalized on 04/29/2022 for acute hypoxic respiratory failure due to pulmonary edema due to missing her dialysis.  She has no new complaints today    REVIEW OF SYSTEMS: Out of a complete 14 system review of symptoms, the patient complains only of the following symptoms, dysarthria, right hand weakness and all other reviewed systems are negative.   ALLERGIES: Allergies  Allergen Reactions   Shrimp [Shellfish Allergy] Shortness Of Breath   Bactroban [Mupirocin] Other (See Comments)    "Sores in nose"   Hydrocodone Itching, Nausea Only and Nausea And Vomiting   Chlorhexidine Gluconate Itching   Tylenol [Acetaminophen] Itching   Zestril [Lisinopril] Cough     HOME MEDICATIONS: Outpatient Medications Prior to Visit  Medication Sig Dispense Refill   albuterol (VENTOLIN HFA) 108 (90 Base) MCG/ACT inhaler Inhale 2 puffs into the lungs every 6 (six) hours as needed for wheezing or shortness of breath. 8.5 g 0   amLODipine (NORVASC) 10 MG tablet Take 1 tablet (10 mg total) by mouth daily. 30 tablet 0   apixaban (ELIQUIS) 2.5 MG TABS tablet Take 1 tablet (2.5 mg total) by mouth 2 (two) times daily. 60 tablet 0   carvedilol  (COREG) 25 MG tablet Take 1 tablet (25 mg total) by mouth 2 (two) times daily with a meal. (Patient taking differently: Take 12.5 mg by mouth 2 (two) times daily with a meal.) 60 tablet 1   Cholecalciferol (VITAMIN D-3 PO) Take 1 capsule by mouth daily.     cloNIDine (CATAPRES - DOSED IN MG/24 HR) 0.2 mg/24hr patch Place 1 patch (0.2 mg total) onto the skin once a week. 13 patch 1   hydrALAZINE (APRESOLINE) 25 MG tablet Take 3 tablets (75 mg total) by mouth every 8 (eight) hours. 810 tablet 1   isosorbide mononitrate (IMDUR) 30 MG 24 hr tablet Take 1 tablet (30 mg total) by mouth daily. 90 tablet 1   naloxone (NARCAN) nasal spray 4 mg/0.1 mL 1 spray as directed.     oxyCODONE-acetaminophen (PERCOCET) 10-325 MG tablet Take 1 tablet by mouth 3 (three) times daily as needed for pain.     pantoprazole (PROTONIX) 40 MG tablet TAKE 1 TABLET (40 MG TOTAL) BY MOUTH 2 (TWO) TIMES DAILY. 60 tablet 1   rosuvastatin (CRESTOR) 10 MG tablet TAKE 1  TABLET (10 MG TOTAL) BY MOUTH DAILY FOR CHOLESTEROL (Patient taking differently: Take 10 mg by mouth daily.) 90 tablet 0   senna-docusate (SENOKOT-S) 8.6-50 MG tablet Take 1 tablet by mouth 2 (two) times daily between meals as needed for mild constipation. 180 tablet 0   sevelamer carbonate (RENVELA) 2.4 g PACK Take 2.4 g by mouth 3 (three) times daily with meals. 90 each 0   traZODone (DESYREL) 50 MG tablet Take 0.5 tablets (25 mg total) by mouth at bedtime as needed for sleep. 45 tablet 0   No facility-administered medications prior to visit.     PAST MEDICAL HISTORY: Past Medical History:  Diagnosis Date   AF (paroxysmal atrial fibrillation) (HCC) 05/29/2019   on Coumadin   Aortic atherosclerosis (HCC) 07/05/2019   Aortic dissection (HCC) 04/04/2019   s/p repair   Bone spur 2008   Right calcaneal foot spur   Breast cancer (HCC) 2004   Ductal carcinoma in situ of the left breast; S/P left partial mastectomy 02/26/2003; S/P re-excision of cranial and lateral  margins11/18/2004.radiation   Carotid artery disease (HCC)    Cerebral thrombosis with cerebral infarction 05/22/2019   Chronic HFrEF (heart failure with reduced ejection fraction) (HCC)    Chronic low back pain 06/22/2016   Chronic obstructive lung disease (HCC) 01/16/2017   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 Chevis Pretty; S/P re-excision of superior and inferior margins 10/27/2012.    ESRD on hemodialysis (HCC) 05/29/2019   Essential hypertension 09/16/2006   GERD 09/16/2006   Hepatitis C    treated and RNA confirmed not detectable 01/2017   Insomnia 03/14/2015   Malnutrition of moderate degree 05/19/2019   Non compliance with medical treatment 12/04/2017   Normocytic anemia    With thrombocytosis   Osteoarthritis    Right ureteral stone 2002   S/P lumbar spinal fusion 01/18/2014   S/P lumbar decompressive laminectomy, fusion, and plating for lumbar spinal stenosis on 05/27/2009 by Dr. Tia Alert.  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 Tia Alert.     Stroke (cerebrum) (HCC)    Tobacco use disorder 04/19/2009   Uterine fibroid    Wears dentures    top     PAST SURGICAL HISTORY: Past Surgical History:  Procedure Laterality Date   ANTERIOR LAT LUMBAR FUSION N/A 01/18/2014   Procedure: ANTERIOR LATERAL LUMBAR FUSION LUMBAR TWO-THREE;  Surgeon: Tia Alert, MD;  Location: [facility];  Service: Neurosurgery;  Laterality: N/A;   ANTERIOR LUMBAR FUSION  01/18/2014   AV FISTULA PLACEMENT Left 04/20/2019   Procedure: ARTERIOVENOUS (AV) FISTULA CREATION;  Surgeon: Maeola Harman, MD;  Location: [facility] OR;  Service: Vascular;  Laterality: Left;   BACK SURGERY     BREAST LUMPECTOMY Left 01/2003   BREAST LUMPECTOMY  Right 2014   BREAST LUMPECTOMY WITH NEEDLE LOCALIZATION AND AXILLARY SENTINEL LYMPH NODE BX Right 10/13/2012   Procedure: BREAST LUMPECTOMY WITH NEEDLE LOCALIZATION;  Surgeon: Robyne Askew, MD;  Location: [facility];  Service: General;  Laterality: Right;  Right breast wire localized lumpectomy   INSERTION OF DIALYSIS CATHETER Right 04/20/2019   Procedure: INSERTION OF DIALYSIS CATHETER, right internal jugular;  Surgeon: Maeola Harman, MD;  Location: [facility] OR;  Service: Vascular;  Laterality: Right;  INSERTION OF DIALYSIS CATHETER Right 09/24/2020   Procedure: INSERTION OF TUNNELED DIALYSIS CATHETER;  Surgeon: Larina Earthly, MD;  Location: [facility] OR;  Service: Vascular;  Laterality: Right;   IR FLUORO GUIDE CV LINE RIGHT  09/22/2020   IR THORACENTESIS ASP PLEURAL SPACE W/IMG GUIDE  05/19/2019   IR US GUIDE VASC ACCESS LEFT  09/22/2020   IR US GUIDE VASC ACCESS RIGHT  09/22/2020   IR VENOCAVAGRAM SVC  09/22/2020   LAMINECTOMY  05/27/2009   Lumbar decompressive laminectomy, fusion and plating for lumbar spinal stensosis   LIGATION OF ARTERIOVENOUS  FISTULA Left 09/24/2020   Procedure: LIGATION OF LEFT ARM ARTERIOVENOUS  FISTULA;  Surgeon: Larina Earthly, MD;  Location[facility];  Service: Vascular;  Laterality: Left;   LUMBAR LAMINECTOMY/DECOMPRESSION MICRODISCECTOMY Left 03/23/2013   Procedure: LUMBAR LAMINECTOMY/DECOMPRESSION MICRODISCECTOMY 1 LEVEL;  Surgeon: Tia Alert, MD;  Location: [facility];  Service: Neurosurgery;  Laterality: Left;  LUMBAR LAMINECTOMY/DECOMPRESSION MICRODISCECTOMY 1 LEVEL   MASTECTOMY, PARTIAL Left 02/26/2003   ; S/P re-excision of cranial and lateral margins 04/19/2003.    RE-EXCISION OF BREAST CANCER,SUPERIOR MARGINS Right 10/27/2012   Procedure: RE-EXCISION OF BREAST CANCER,SUPERIOR and inferior MARGINS;  Surgeon: Robyne Askew, MD;  Location: [facility] OR;  Service: General;  Laterality: Right;   RE-EXCISION OF BREAST LUMPECTOMY Left 04/2003   TEE  WITHOUT CARDIOVERSION N/A 04/04/2019   Procedure: Transesophageal Echocardiogram (Tee);  Surgeon: Linden Dolin, MD;  Location: [facility] OR;  Service: Open Heart Surgery;  Laterality: N/A;   THORACIC AORTIC ANEURYSM REPAIR N/A 04/04/2019   Procedure: THORACIC ASCENDING ANEURYSM REPAIR (AAA)  USING 28 MM X 30 CM HEMASHIELD PLATINUM VASCULAR GRAFT;  Surgeon: Linden Dolin, MD;  Location: [facility] OR;  Service: Open Heart Surgery;  Laterality: N/A;     FAMILY HISTORY: Family History  Problem Relation Age of Onset   Colon cancer Mother 45   Hypertension Mother    Diabetes Sister 57   Hypertension Sister    Diabetes Brother    Hypertension Brother    Diabetes Brother    Hypertension Brother    Kidney disease Son        s/p renal transplant   Hypertension Son    Diabetes Son    Multiple sclerosis Son    Bone cancer Sister 41   Breast cancer Neg Hx    Cervical cancer Neg Hx      SOCIAL HISTORY: Social History   Socioeconomic History   Marital status: Single    Spouse name: Not on file   Number of children: 2   Years of education: Not on file   Highest education level: Not on file  Occupational History   Occupation: disabled  Tobacco Use   Smoking status: Former    Packs/day: 0.25    Years: 44.00    Additional pack years: 0.00    Total pack years: 11.00    Types: Cigarettes    Quit date: 2022    Years since quitting: 2.4   Smokeless tobacco: Never   Tobacco comments:    smoking less  Vaping Use   Vaping Use: Never used  Substance and Sexual Activity   Alcohol use: Not Currently    Alcohol/week: 2.0 standard drinks of alcohol    Types: 2 Cans of beer per week    Comment: "couple beers a weekend", not currently   Drug use: Not Currently    Types: Cocaine    Comment: last in 2005  Sexual activity: Yes    Birth control/protection: Post-menopausal  Other Topics Concern   Not on file  Social History Narrative   Not on file   Social Determinants of Health    Financial Resource Strain: Not on file  Food Insecurity: No Food Insecurity (04/30/2022)   Hunger Vital Sign    Worried About Running Out of Food in the Last Year: Never true    Ran Out of Food in the Last Year: Never true  Transportation Needs: No Transportation Needs (04/30/2022)   PRAPARE - Administrator, Civil Service (Medical): No    Lack of Transportation (Non-Medical): No  Physical Activity: Not on file  Stress: Not on file  Social Connections: Not on file  Intimate Partner Violence: Not At Risk (04/30/2022)   Humiliation, Afraid, Rape, and Kick questionnaire    Fear of Current or Ex-Partner: No    Emotionally Abused: No    Physically Abused: No    Sexually Abused: No     PHYSICAL EXAM  Vitals:   11/16/22 1126  BP: (!) 147/83  Pulse: 81  Weight: 108 lb (49 kg)  Height: 5\' 3"  (1.6 m)   Body mass index is 19.13 kg/m.  Generalized: Well developed, in no acute distress  Cardiology: normal rate and rhythm, no murmur auscultated  Respiratory: clear to auscultation bilaterally    Neurological examination  Mentation: Alert oriented to time, place, history taking. Follows all commands speech and language fluent Cranial nerve II-XII: Pupils were equal round reactive to light. Extraocular movements were full, visual field were full on confrontational test. Facial sensation and strength were normal. Uvula tongue midline. Head turning and shoulder shrug  were normal and symmetric. Motor: The motor testing reveals 5 over 5 strength of all 4 extremities with exception of 4+/5 right upper ext, 3+-4/right hand grip. Good symmetric motor tone is noted throughout.  Sensory: Sensory testing is intact to soft touch on all 4 extremities. No evidence of extinction is noted.  Coordination: Cerebellar testing reveals good finger-nose-finger and heel-to-shin bilaterally.  Gait and station: Gait is normal.   Reflexes: Deep tendon reflexes are symmetric and normal bilaterally.     DIAGNOSTIC DATA (LABS, IMAGING, TESTING) - I reviewed patient records, labs, notes, testing and imaging myself where available.  Lab Results  Component Value Date   WBC 7.4 10/10/2022   HGB 9.5 (L) 10/10/2022   HCT 31.3 (L) 10/10/2022   MCV 95.1 10/10/2022   PLT 197 10/10/2022      Component Value Date/Time   NA 142 10/10/2022 0550   NA 140 05/13/2020 1108   NA 142 09/28/2012 0831   K 3.7 10/10/2022 0550   K 4.2 09/28/2012 0831   CL 99 10/10/2022 0550   CL 103 09/28/2012 0831   CO2 27 10/10/2022 0550   CO2 30 (H) 09/28/2012 0831   GLUCOSE 125 (H) 10/10/2022 0550   GLUCOSE 103 (H) 09/28/2012 0831   BUN 39 (H) 10/10/2022 0550   BUN 47 (H) 05/13/2020 1108   BUN 18.3 09/28/2012 0831   CREATININE 8.62 (H) 10/10/2022 0550   CREATININE 2.09 (H) 01/18/2017 1145   CREATININE 1.2 (H) 09/28/2012 0831   CALCIUM 9.4 10/10/2022 0550   CALCIUM 9.4 09/28/2012 0831   PROT 6.9 09/25/2022 0319   PROT 6.9 05/13/2020 1108   PROT 7.2 09/28/2012 0831   ALBUMIN 3.5 09/25/2022 0940   ALBUMIN 4.0 05/13/2020 1108   ALBUMIN 3.5 09/28/2012 0831   AST 14 (L) 09/25/2022 0319  AST 35 (H) 09/28/2012 0831   ALT 7 09/25/2022 0319   ALT 23 12/09/2016 1458   ALT 41 09/28/2012 0831   ALKPHOS 78 09/25/2022 0319   ALKPHOS 89 09/28/2012 0831   BILITOT 0.5 09/25/2022 0319   BILITOT 0.2 05/13/2020 1108   BILITOT 0.34 09/28/2012 0831   GFRNONAA 5 (L) 10/10/2022 0550   GFRNONAA 23 (L) 12/09/2016 1458   GFRAA 8 (L) 05/13/2020 1108   GFRAA 26 (L) 12/09/2016 1458   Lab Results  Component Value Date   CHOL 104 02/24/2022   HDL 49 02/24/2022   LDLCALC 46 02/24/2022   TRIG 43 02/24/2022   CHOLHDL 2.1 02/24/2022   Lab Results  Component Value Date   HGBA1C 4.7 (L) 02/23/2022   Lab Results  Component Value Date   VITAMINB12 530 06/04/2021   Lab Results  Component Value Date   TSH 0.596 05/06/2022       07/22/2022    9:34 AM  MMSE - Mini Mental State Exam  Orientation to time 5   Orientation to Place 5  Registration 3  Attention/ Calculation 5  Recall 3  Language- name 2 objects 2  Language- repeat 1  Language- read & follow direction 1  Write a sentence 1         No data to display           ASSESSMENT AND PLAN  72 y.o. 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 here with    Cerebrovascular accident (CVA) due to embolism of left middle cerebral artery (HCC)  Dysarthria  Right hand weakness  Monique Henderson is doing well, today. She is followed closely by care team. She will continue Eliquis and rosuvastatin per PCP and cardiology. Stroke prevention reviewed. She will continue to monitor BP closely. She was encouraged to stay physically and mentally active. Healthy lifestyle habits encouraged. She will follow up with care team as directed. She will return to see me as needed. She verbalizes understanding and agreement with this plan.   No orders of the defined types were placed in this encounter.    No orders of the defined types were placed  in this encounter.   I spent 30 minutes of face-to-face and non-face-to-face time with patient.  This included previsit chart review, lab review, study review, order entry, electronic health record documentation, patient education.   Shawnie Dapper, MSN, FNP-C 11/16/2022, 12:23 PM  [facility] [address] [place], [place] [address] [phone]

## 2022-11-16 ENCOUNTER — Encounter: Payer: Self-pay | Admitting: Interventional Cardiology

## 2022-11-16 ENCOUNTER — Ambulatory Visit: Payer: 59 | Attending: Interventional Cardiology | Admitting: Interventional Cardiology

## 2022-11-16 ENCOUNTER — Ambulatory Visit: Payer: Medicare Other | Admitting: Adult Health

## 2022-11-16 ENCOUNTER — Ambulatory Visit (INDEPENDENT_AMBULATORY_CARE_PROVIDER_SITE_OTHER): Payer: 59 | Admitting: Family Medicine

## 2022-11-16 ENCOUNTER — Encounter: Payer: Self-pay | Admitting: Family Medicine

## 2022-11-16 VITALS — BP 150/72 | HR 82 | Ht 63.0 in | Wt 109.0 lb

## 2022-11-16 VITALS — BP 147/83 | HR 81 | Ht 63.0 in | Wt 108.0 lb

## 2022-11-16 DIAGNOSIS — R29898 Other symptoms and signs involving the musculoskeletal system: Secondary | ICD-10-CM

## 2022-11-16 DIAGNOSIS — I10 Essential (primary) hypertension: Secondary | ICD-10-CM

## 2022-11-16 DIAGNOSIS — R471 Dysarthria and anarthria: Secondary | ICD-10-CM

## 2022-11-16 DIAGNOSIS — I63412 Cerebral infarction due to embolism of left middle cerebral artery: Secondary | ICD-10-CM | POA: Diagnosis not present

## 2022-11-16 DIAGNOSIS — N186 End stage renal disease: Secondary | ICD-10-CM | POA: Diagnosis not present

## 2022-11-16 DIAGNOSIS — Z8679 Personal history of other diseases of the circulatory system: Secondary | ICD-10-CM

## 2022-11-16 DIAGNOSIS — I5022 Chronic systolic (congestive) heart failure: Secondary | ICD-10-CM | POA: Diagnosis not present

## 2022-11-16 DIAGNOSIS — Z992 Dependence on renal dialysis: Secondary | ICD-10-CM

## 2022-11-16 DIAGNOSIS — I480 Paroxysmal atrial fibrillation: Secondary | ICD-10-CM

## 2022-11-16 MED ORDER — ISOSORBIDE MONONITRATE ER 30 MG PO TB24: ORAL_TABLET | ORAL | 1 refills | Status: DC

## 2022-11-16 NOTE — Progress Notes (Signed)
Cardiology Office Note   Date:  11/16/2022   ID:  Monique Henderson, DOB 08-Apr-1951, MRN [id number]  PCP:  Fleet Contras, MD    No chief complaint on file.    Wt Readings from Last 3 Encounters:  11/16/22 109 lb (49.4 kg)  11/16/22 108 lb (49 kg)  10/21/22 104 lb 3.2 oz (47.3 kg)       History of Present Illness: Monique Henderson is a 72 y.o. female  former patient of Dr. Katrinka Blazing  with a hx of Type A aortic dissection with endovascular repair, CVA, PAF, aortic atherosclerosis, ESRD on HD (T,Th,Sat), COPD, hypertension, and chronic anticoagulation.   04/2019: " THORACIC AORTIC ANEURYSM REPAIR N/A 04/04/2019     Procedure: THORACIC ASCENDING ANEURYSM REPAIR (AAA)  USING 28 MM X 30 CM HEMASHIELD PLATINUM VASCULAR GRAFT;  Surgeon: Linden Dolin, MD;  Location: [facility];  Service: Open Heart Surgery;  Laterality: N/A;    Denies : Chest pain. Dizziness. Leg edema. Nitroglycerin use. Orthopnea. Palpitations. Paroxysmal nocturnal dyspnea. Shortness of breath. Syncope.     Does well walking up steps in her townhouse.    Past Medical History:  Diagnosis Date   AF (paroxysmal atrial fibrillation) (HCC) 05/29/2019   on Coumadin   Aortic atherosclerosis (HCC) 07/05/2019   Aortic dissection (HCC) 04/04/2019   s/p repair   Bone spur 2008   Right calcaneal foot spur   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 Chevis Pretty; S/P re-excision of superior and inferior margins 10/27/2012.    ESRD on  hemodialysis (HCC) 05/29/2019   Essential hypertension 09/16/2006   GERD 09/16/2006   Hepatitis C    treated and RNA confirmed not detectable 01/2017   Insomnia 03/14/2015   Malnutrition of moderate degree 05/19/2019   Non compliance with medical treatment 12/04/2017   Normocytic anemia    With thrombocytosis   Osteoarthritis    Right ureteral stone 2002   S/P lumbar spinal fusion 01/18/2014   S/P lumbar decompressive laminectomy, fusion, and plating for lumbar spinal stenosis on 05/27/2009 by Dr. Tia Alert.  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 Tia Alert.     Stroke (cerebrum) (HCC)    Tobacco use disorder 04/19/2009   Uterine fibroid    Wears dentures    top    Past Surgical History:  Procedure Laterality Date   ANTERIOR LAT LUMBAR FUSION N/A 01/18/2014   Procedure: ANTERIOR LATERAL LUMBAR FUSION LUMBAR TWO-THREE;  Surgeon: Tia Alert, MD;  Location: [facility];  Service: Neurosurgery;  Laterality: N/A;   ANTERIOR LUMBAR FUSION  01/18/2014   AV FISTULA PLACEMENT Left 04/20/2019   Procedure: ARTERIOVENOUS (AV) FISTULA CREATION;  Surgeon: Maeola Harman, MD;  Location: [facility] OR;  Service: Vascular;  Laterality: Left;   BACK SURGERY     BREAST LUMPECTOMY Left 01/2003   BREAST  LUMPECTOMY Right 2014   BREAST LUMPECTOMY WITH NEEDLE LOCALIZATION AND AXILLARY SENTINEL LYMPH NODE BX Right 10/13/2012   Procedure: BREAST LUMPECTOMY WITH NEEDLE LOCALIZATION;  Surgeon: Robyne Askew, MD;  Location: [facility];  Service: General;  Laterality: Right;  Right breast wire localized lumpectomy   INSERTION OF DIALYSIS CATHETER Right 04/20/2019   Procedure: INSERTION OF DIALYSIS CATHETER, right internal jugular;  Surgeon: Maeola Harman, MD;  Location: [facility] OR;   Service: Vascular;  Laterality: Right;   INSERTION OF DIALYSIS CATHETER Right 09/24/2020   Procedure: INSERTION OF TUNNELED DIALYSIS CATHETER;  Surgeon: Larina Earthly, MD;  Location: [facility];  Service: Vascular;  Laterality: Right;   IR FLUORO GUIDE CV LINE RIGHT  09/22/2020   IR THORACENTESIS ASP PLEURAL SPACE W/IMG GUIDE  05/19/2019   IR US GUIDE VASC ACCESS LEFT  09/22/2020   IR US GUIDE VASC ACCESS RIGHT  09/22/2020   IR VENOCAVAGRAM SVC  09/22/2020   LAMINECTOMY  05/27/2009   Lumbar decompressive laminectomy, fusion and plating for lumbar spinal stensosis   LIGATION OF ARTERIOVENOUS  FISTULA Left 09/24/2020   Procedure: LIGATION OF LEFT ARM ARTERIOVENOUS  FISTULA;  Surgeon: Larina Earthly, MD;  Location: [facility] OR;  Service: Vascular;  Laterality: Left;   LUMBAR LAMINECTOMY/DECOMPRESSION MICRODISCECTOMY Left 03/23/2013   Procedure: LUMBAR LAMINECTOMY/DECOMPRESSION MICRODISCECTOMY 1 LEVEL;  Surgeon: Tia Alert, MD;  [facility];  Service: Neurosurgery;  Laterality: Left;  LUMBAR LAMINECTOMY/DECOMPRESSION MICRODISCECTOMY 1 LEVEL   MASTECTOMY, PARTIAL Left 02/26/2003   ; S/P re-excision of cranial and lateral margins 04/19/2003.    RE-EXCISION OF BREAST CANCER,SUPERIOR MARGINS Right 10/27/2012   Procedure: RE-EXCISION OF BREAST CANCER,SUPERIOR and inferior MARGINS;  Surgeon: Robyne Askew, MD;  Location: [facility] OR;  Service: General;  Laterality: Right;   RE-EXCISION OF BREAST LUMPECTOMY Left 04/2003   TEE WITHOUT CARDIOVERSION N/A 04/04/2019   Procedure: Transesophageal Echocardiogram (Tee);  Surgeon: Linden Dolin, MD;  Location: [facility] OR;  Service: Open Heart Surgery;  Laterality: N/A;   THORACIC AORTIC ANEURYSM REPAIR N/A 04/04/2019   Procedure: THORACIC ASCENDING ANEURYSM REPAIR (AAA)  USING 28 MM X 30 CM HEMASHIELD PLATINUM VASCULAR GRAFT;  Surgeon: Linden Dolin, MD;  Location: [facility];  Service: Open Heart Surgery;  Laterality: N/A;     Current Outpatient Medications  Medication  Sig Dispense Refill   albuterol (VENTOLIN HFA) 108 (90 Base) MCG/ACT inhaler Inhale 2 puffs into the lungs every 6 (six) hours as needed for wheezing or shortness of breath. 8.5 g 0   amLODipine (NORVASC) 10 MG tablet Take 1 tablet (10 mg total) by mouth daily. 30 tablet 0   apixaban (ELIQUIS) 2.5 MG TABS tablet Take 1 tablet (2.5 mg total) by mouth 2 (two) times daily. 60 tablet 0   carvedilol (COREG) 25 MG tablet Take 1 tablet (25 mg total) by mouth 2 (two) times daily with a meal. 60 tablet 1   Cholecalciferol (VITAMIN D-3 PO) Take 1 capsule by mouth daily.     cloNIDine (CATAPRES - DOSED IN MG/24 HR) 0.2 mg/24hr patch Place 1 patch (0.2 mg total) onto the skin once a week. 13 patch 1   hydrALAZINE (APRESOLINE) 25 MG tablet Take 3 tablets (75 mg total) by mouth every 8 (eight) hours. 810 tablet 1   isosorbide mononitrate (IMDUR) 30 MG 24 hr tablet Take 1 tablet (30 mg total) by mouth daily. 90 tablet 1   oxyCODONE-acetaminophen (PERCOCET) 10-325 MG tablet Take 1 tablet  by mouth 3 (three) times daily as needed for pain.     pantoprazole (PROTONIX) 40 MG tablet TAKE 1 TABLET (40 MG TOTAL) BY MOUTH 2 (TWO) TIMES DAILY. 60 tablet 1   rosuvastatin (CRESTOR) 10 MG tablet TAKE 1 TABLET (10 MG TOTAL) BY MOUTH DAILY FOR CHOLESTEROL (Patient taking differently: Take 10 mg by mouth daily.) 90 tablet 0   senna-docusate (SENOKOT-S) 8.6-50 MG tablet Take 1 tablet by mouth 2 (two) times daily between meals as needed for mild constipation. 180 tablet 0   sevelamer carbonate (RENVELA) 2.4 g PACK Take 2.4 g by mouth 3 (three) times daily with meals. 90 each 0   traZODone (DESYREL) 50 MG tablet Take 0.5 tablets (25 mg total) by mouth at bedtime as needed for sleep. 45 tablet 0   naloxone (NARCAN) nasal spray 4 mg/0.1 mL 1 spray as directed.     No current facility-administered medications for this visit.    Allergies:   Shrimp [shellfish allergy], Bactroban [mupirocin], Hydrocodone, Chlorhexidine gluconate,  Tylenol [acetaminophen], and Zestril [lisinopril]    Social History:  The patient  reports that she quit smoking about 2 years ago. Her smoking use included cigarettes. She has a 11.00 pack-year smoking history. She has never used smokeless tobacco. She reports that she does not currently use alcohol after a past usage of about 2.0 standard drinks of alcohol per week. She reports that she does not currently use drugs after having used the following drugs: Cocaine.   Family History:  The patient's family history includes Bone cancer (age of onset: 12) in her sister; Colon cancer (age of onset: 17) in her mother; Diabetes in her brother, brother, and son; Diabetes (age of onset: 66) in her sister; Hypertension in her brother, brother, mother, sister, and son; Kidney disease in her son; Multiple sclerosis in her son.    ROS:  Please see the history of present illness.   Otherwise, review of systems are positive for right arm pain- chronic.   All other systems are reviewed and negative.    PHYSICAL EXAM: VS:  BP (!) 160/70   Pulse 82   Ht 5\' 3"  (1.6 m)   Wt 109 lb (49.4 kg)   BMI 19.31 kg/m  , BMI Body mass index is 19.31 kg/m. GEN: Well nourished, well developed, in no acute distress HEENT: normal Neck: no JVD, carotid bruits, or masses Cardiac: RRR; loud S1, S2; split S2, no murmurs, rubs, or gallops,no edema  Respiratory:  clear to auscultation bilaterally, normal work of breathing GI: soft, nontender, nondistended, + BS MS: no deformity or atrophy Skin: warm and dry, no rash Neuro:  Slurred speech at baseline Psych: euthymic mood, full affect   Recent Labs: 05/06/2022: TSH 0.596 08/11/2022: B Natriuretic Peptide >4,500.0 09/25/2022: ALT 7 10/10/2022: BUN 39; Creatinine, Ser 8.62; Hemoglobin 9.5; Magnesium 2.2; Platelets 197; Potassium 3.7; Sodium 142   Lipid Panel    Component Value Date/Time   CHOL 104 02/24/2022 0411   CHOL 167 05/13/2020 1108   TRIG 43 02/24/2022 0411   HDL  49 02/24/2022 0411   HDL 39 (L) 05/13/2020 1108   CHOLHDL 2.1 02/24/2022 0411   VLDL 9 02/24/2022 0411   LDLCALC 46 02/24/2022 0411   LDLCALC 100 (H) 05/13/2020 1108     Other studies Reviewed: Additional studies/ records that were reviewed today with results demonstrating: labs reviewed.   ASSESSMENT AND PLAN:  S/p aortic dissection repair in 2020.  Seems to be doing well.  No valve  replacement at that time.  We spoke about the importance of blood pressure control. HFrEF: EF improved to 45-50% on most recent echocardiogram.  LVH noted.  I suspect this is all from hypertension.  End-stage renal disease also likely due to hypertension. PAF: Eliquis for stroke prevention.  No bleeding issues. ESRD: on dialysis.  Unsure if she has low BPs at dialysis.  If she does have low blood pressure readings at dialysis, will have to adjust dosing of medicines such that doses are reduced on dialysis days. HTN: Home readings are in the 150s systolic typically.  Increase Imdur to 60 mg daily.  Use as directed.  Will start the increased dose of isosorbide on nondialysis days. If the blood pressure remains high,Could make it 60 mg daily including dialysis days . Hyperlipidemia: Continue Crestor ,  LDL 46.     Current medicines are reviewed at length with the patient today.  The patient concerns regarding her medicines were addressed.  The following changes have been made:    Labs/ tests ordered today include:  No orders of the defined types were placed in this encounter.   Recommend 150 minutes/week of aerobic exercise Low fat, low carb, high fiber diet recommended  Disposition:   FU in PharmD HTN clinic   Signed, Lance Muss, MD  11/16/2022 1:43 PM    [facility] [address] [place], [place], [place]  [address] Phone: [phone]; Fax: [phone]

## 2022-11-16 NOTE — Patient Instructions (Addendum)
Medication Instructions:  Your physician has recommended you make the following change in your medication:  Change Isosorbide mononitrate to 30 mg on dialysis days and 60 mg on non dialysis days.   *If you need a refill on your cardiac medications before your next appointment, please call your pharmacy*   Lab Work: none If you have labs (blood work) drawn today and your tests are completely normal, you will receive your results only by: MyChart Message (if you have MyChart) OR A paper copy in the mail If you have any lab test that is abnormal or we need to change your treatment, we will call you to review the results.   Testing/Procedures: none   Follow-Up: At [facility], you and your health needs are our priority.  As part of our continuing mission to provide you with exceptional heart care, we have created designated Provider Care Teams.  These Care Teams include your primary Cardiologist (physician) and Advanced Practice Providers (APPs -  Physician Assistants and Nurse Practitioners) who all work together to provide you with the care you need, when you need it.  We recommend signing up for the patient portal called "MyChart".  Sign up information is provided on this After Visit Summary.  MyChart is used to connect with patients for Virtual Visits (Telemedicine).  Patients are able to view lab/test results, encounter notes, upcoming appointments, etc.  Non-urgent messages can be sent to your provider as well.   To learn more about what you can do with MyChart, go to [email or web address].    Your next appointment:   6 month(s)  Provider:   Lance Muss, MD     Other Instructions You have been referred to see the pharmacist in the hypertension clinic in our office.  Please schedule new patient appointment for about 4 weeks from now.  Check blood pressure at home and bring readings and your home blood pressure cuff to the appointment with the pharmacist.

## 2022-11-17 ENCOUNTER — Other Ambulatory Visit: Payer: Self-pay | Admitting: Family Medicine

## 2022-11-17 NOTE — Telephone Encounter (Signed)
Pt no longer under prescribers care  Requested Prescriptions  Refused Prescriptions Disp Refills   rosuvastatin (CRESTOR) 10 MG tablet [Pharmacy Med Name: ROSUVASTATIN CALCIUM 10 MG ORAL TABLET] 90 tablet 0    Sig: TAKE 1 TABLET (10 MG TOTAL) BY MOUTH DAILY FOR CHOLESTEROL     Cardiovascular:  Antilipid - Statins 2 Failed - 11/17/2022  9:51 AM      Failed - Cr in normal range and within 360 days    Creatinine  Date Value Ref Range Status  09/28/2012 1.2 (H) 0.6 - 1.1 mg/dL Final   Creat  Date Value Ref Range Status  01/18/2017 2.09 (H) 0.50 - 0.99 mg/dL Final    Comment:      For patients > or = 70 years of age: The upper reference limit for Creatinine is approximately 13% higher for people identified as African-American.      Creatinine, Ser  Date Value Ref Range Status  10/10/2022 8.62 (H) 0.44 - 1.00 mg/dL Final   Creatinine,U  Date Value Ref Range Status  01/17/2009 276.0 mg/dL Final   Creatinine, Urine  Date Value Ref Range Status  08/29/2008 243.0 mg/dL Final         Failed - Lipid Panel in normal range within the last 12 months    Cholesterol, Total  Date Value Ref Range Status  05/13/2020 167 100 - 199 mg/dL Final   Cholesterol  Date Value Ref Range Status  02/24/2022 104 0 - 200 mg/dL Final   LDL Chol Calc (NIH)  Date Value Ref Range Status  05/13/2020 100 (H) 0 - 99 mg/dL Final   LDL Cholesterol  Date Value Ref Range Status  02/24/2022 46 0 - 99 mg/dL Final    Comment:           Total Cholesterol/HDL:CHD Risk Coronary Heart Disease Risk Table                     Men   Women  1/2 Average Risk   3.4   3.3  Average Risk       5.0   4.4  2 X Average Risk   9.6   7.1  3 X Average Risk  23.4   11.0        Use the calculated Patient Ratio above and the CHD Risk Table to determine the patient's CHD Risk.        ATP III CLASSIFICATION (LDL):  <100     mg/dL   Optimal  782-956  mg/dL   Near or Above                    Optimal  130-159  mg/dL    Borderline  213-086  mg/dL   High  >578     mg/dL   Very High Performed at [facility], [address] [place], [place] [address]    HDL  Date Value Ref Range Status  02/24/2022 49 >40 mg/dL Final  95/28/4132 39 (L) >39 mg/dL Final   Triglycerides  Date Value Ref Range Status  02/24/2022 43 <150 mg/dL Final         Passed - Patient is not pregnant      Passed - Valid encounter within last 12 months    Recent Outpatient Visits           3 months ago Encounter for [employer] annual wellness exam   [facility] [place] [facility], MD  5 months ago Essential hypertension   [place] [facility] MD   7 months ago Essential hypertension   [facility], MD   7 months ago Essential hypertension   [place] [facility], MD   2 years ago Anemia due to stage 5 chronic kidney disease, not on chronic dialysis [facility])   [facility], Gerlene Burdock, NP       Future Appointments             In 1 month  [place] [facility]   In 5 months [place], Donnie Coffin, MD [facility] [employer], [facility]

## 2022-12-18 ENCOUNTER — Ambulatory Visit: Payer: 59 | Attending: Cardiology | Admitting: Pharmacist

## 2022-12-18 VITALS — BP 148/86 | HR 72

## 2022-12-18 DIAGNOSIS — I10 Essential (primary) hypertension: Secondary | ICD-10-CM | POA: Diagnosis not present

## 2022-12-18 MED ORDER — CLONIDINE HCL 0.1 MG PO TABS: 0.1000 mg | ORAL_TABLET | Freq: Two times a day (BID) | ORAL | 11 refills | Status: DC

## 2022-12-18 MED ORDER — HYDRALAZINE HCL 100 MG PO TABS: 100.0000 mg | ORAL_TABLET | Freq: Three times a day (TID) | ORAL | 3 refills | Status: AC

## 2022-12-18 NOTE — Progress Notes (Signed)
Patient ID: Monique Henderson                 DOB: Nov 27, 1950                      MRN: [id number]     HPI: Monique Henderson is a 72 y.o. female referred by Dr. Eldridge Dace to HTN clinic. 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 Pt last seen by MD on 11/16/22 where BP was elevated at 150/72. Home SBP in the 150s as well. Imdur was increased to 60mg  on non dialysis days.  Patient presents today with family members who assist with her medications. Denies dizziness, falls, headache, blurred vision, LE edema, and chest pain. Goes to HD on Tuesdays, Thursdays, and Saturdays. Not sure how BP is running at dialysis but no symptoms of hypotension. Has taken her meds this morning. Brings in wrist cuff today, reading 136/82 in clinic. Cuff has memory, prior readings include (date/time not set): 156/105, 136/82, 160/113,172/121, 113/72, 162/61, 166/72, 121/79, 172/77, 132/72, 163/82, 122/71. Pt recalls better BP readings in the morning. Sometimes late with changing clonidine patch which may be causing elevated BP readings. Also reports itching on clonidine patch. Wrist cuff easier to use given fistula for dialysis.  Current HTN meds:  Amlodipine 10mg  daily - AM Carvedilol 25mg  BID  Clonidine 0.2mg  patch weekly - Saturdays Hydralazine 75mg  TID - morning, afternoon, bedtime  Imdur 60mg  daily except 30mg  on dialysis days  Previously tried: lisinopril - cough  BP goal: <130/22mmHg  Family History: The patient's family history includes Bone cancer (age of onset: 62) in her sister; Colon cancer (age of onset: 40) in her mother; Diabetes in her brother, brother, and son; Diabetes (age of onset: 14) in her sister; Hypertension in her brother, brother, mother, sister, and son; Kidney disease in her son; Multiple  sclerosis in her son.   Social History: The patient  reports that she quit smoking about 2 years ago. Her smoking use included cigarettes. She has a 11.00 pack-year smoking history. She has never used smokeless tobacco. She reports that she does not currently use alcohol after a past usage of about 2.0 standard drinks of alcohol per week. She reports that she does not currently use drugs after having used the following drugs: Cocaine.   Diet: Doesn't add salt to food. Minimal caffeine.  Wt Readings from Last 3 Encounters:  11/16/22 109 lb (49.4 kg)  11/16/22 108 lb (49 kg)  10/21/22 104 lb 3.2 oz (47.3 kg)   BP Readings from Last 3 Encounters:  11/16/22 (!) 150/72  11/16/22 (!) 147/83  10/21/22 (!) 142/78   Pulse Readings from Last 3 Encounters:  11/16/22 82  11/16/22 81  10/21/22 78    Renal function: CrCl cannot be calculated (Patient's most recent lab result is older than the maximum 21 days allowed.).  Past Medical History:  Diagnosis Date   AF (paroxysmal atrial fibrillation) (HCC) 05/29/2019   on Coumadin   Aortic atherosclerosis (HCC) 07/05/2019   Aortic dissection (HCC) 04/04/2019   s/p repair   Bone spur 2008   Right calcaneal foot spur   Breast cancer (HCC) 2004   Ductal carcinoma in situ of the left breast; S/P left partial mastectomy 02/26/2003; S/P re-excision of cranial and lateral margins11/18/2004.radiation   Carotid artery disease (  HCC)    Cerebral thrombosis with cerebral infarction 05/22/2019   Chronic HFrEF (heart failure with reduced ejection fraction) (HCC)    Chronic low back pain 06/22/2016   Chronic obstructive lung disease (HCC) 01/16/2017   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 Chevis Pretty; S/P re-excision of superior and inferior margins 10/27/2012.    ESRD on hemodialysis (HCC) 05/29/2019   Essential hypertension 09/16/2006   GERD 09/16/2006   Hepatitis C    treated and RNA confirmed  not detectable 01/2017   Insomnia 03/14/2015   Malnutrition of moderate degree 05/19/2019   Non compliance with medical treatment 12/04/2017   Normocytic anemia    With thrombocytosis   Osteoarthritis    Right ureteral stone 2002   S/P lumbar spinal fusion 01/18/2014   S/P lumbar decompressive laminectomy, fusion, and plating for lumbar spinal stenosis on 05/27/2009 by Dr. Tia Alert.  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 Tia Alert.     Stroke (cerebrum) (HCC)    Tobacco use disorder 04/19/2009   Uterine fibroid    Wears dentures    top    Current Outpatient Medications on File Prior to Visit  Medication Sig Dispense Refill   albuterol (VENTOLIN HFA) 108 (90 Base) MCG/ACT inhaler Inhale 2 puffs into the lungs every 6 (six) hours as needed for wheezing or shortness of breath. 8.5 g 0   amLODipine (NORVASC) 10 MG tablet Take 1 tablet (10 mg total) by mouth daily. 30 tablet 0   apixaban (ELIQUIS) 2.5 MG TABS tablet Take 1 tablet (2.5 mg total) by mouth 2 (two) times daily. 60 tablet 0   carvedilol (COREG) 25 MG tablet Take 1 tablet (25 mg total) by mouth 2 (two) times daily with a meal. 60 tablet 1   Cholecalciferol (VITAMIN D-3 PO) Take 1 capsule by mouth daily.     cloNIDine (CATAPRES - DOSED IN MG/24 HR) 0.2 mg/24hr patch Place 1 patch (0.2 mg total) onto the skin once a week. 13 patch 1   hydrALAZINE (APRESOLINE) 25 MG tablet Take 3 tablets (75 mg total) by mouth every 8 (eight) hours. 810 tablet 1   isosorbide mononitrate (IMDUR) 30 MG 24 hr tablet Take 30 mg on dialysis days and 60 mg on non dialysis days 90 tablet 1   naloxone (NARCAN) nasal spray 4 mg/0.1 mL 1 spray as directed.     oxyCODONE-acetaminophen (PERCOCET) 10-325 MG tablet Take 1 tablet by mouth 3 (three) times daily  as needed for pain.     pantoprazole (PROTONIX) 40 MG tablet TAKE 1 TABLET (40 MG TOTAL) BY MOUTH 2 (TWO) TIMES DAILY. 60 tablet 1   rosuvastatin (CRESTOR) 10 MG tablet TAKE 1 TABLET (10 MG TOTAL) BY MOUTH DAILY FOR CHOLESTEROL (Patient taking differently: Take 10 mg by mouth daily.) 90 tablet 0   senna-docusate (SENOKOT-S) 8.6-50 MG tablet Take 1 tablet by mouth 2 (two) times daily between meals as needed for mild constipation. 180 tablet 0   sevelamer carbonate (RENVELA) 2.4 g PACK Take 2.4 g by mouth 3 (three) times daily with meals. 90 each 0   traZODone (DESYREL) 50 MG tablet Take 0.5 tablets (25 mg total) by mouth at bedtime as needed for sleep. 45 tablet 0   No current facility-administered medications on  file prior to visit.    Allergies  Allergen Reactions   Shrimp [Shellfish Allergy] Shortness Of Breath   Bactroban [Mupirocin] Other (See Comments)    "Sores in nose"   Hydrocodone Itching, Nausea Only and Nausea And Vomiting   Chlorhexidine Gluconate Itching   Tylenol [Acetaminophen] Itching   Zestril [Lisinopril] Cough     Assessment/Plan:  1. Hypertension - BP elevated above goal < 130/53mmHg. Home wrist cuff measuring a bit low compared to clinic cuff today (~82mmHg lower systolic). Will increase hydralazine from 75mg  TID to 100mg  TID for better efficacy and to decrease pill burden (will reduce pill count by 6 tabs as she had been taking 3 of the 25mg  tabs TID). Will change clonidine 0.2mg  patch to oral clonidine 0.1mg  BID as she's been having itching with the patch and sometimes changes it late which may be the cause of some of her higher BP readings. She'll continue on her other BP meds including amlodipine 10mg  daily, carvedilol 25mg  BID, and Imdur 60mg  except 30mg  on dialysis days. F/u with me in 4 weeks for BP check. She's updating the time on her BP cuff and will bring this in to review readings at next visit.   Monique Henderson E. Noreen Mackintosh, PharmD, BCACP, CPP [facility] [address] [place], [place] [address] Phone: [phone]; Fax: [phone] 12/18/2022 1:55 PM

## 2022-12-18 NOTE — Patient Instructions (Addendum)
Your blood pressure goal is < 130/35mmHg   Increase your hydralazine from 75mg  to 100mg  3x a day. I sent in an updated prescription with the 100mg  tablet so you'll start taking 1 of these tablets 3x a day  Check your blood pressure on Saturday (tomorrow). If it's over 130/80, go ahead and start on the clonidine pill, 1 tablet twice a day. If it's under 130/80, wait a day and start it on Sunday instead. You will stop using your clonidine patch  Continue checking your pressure at home. Please bring in your wrist cuff to your next visit in 4 weeks  Important lifestyle changes to control high blood pressure  Intervention  Effect on the BP   Weight loss Weight loss is one of the most effective lifestyle changes for controlling blood pressure. If you're overweight or obese, losing even a small amount of weight can help reduce blood pressure.    Blood pressure can decrease by 1 millimeter of mercury (mmHg) with each kilogram (about 2.2 pounds) of weight lost.   Exercise regularly As a general goal, aim for 30 minutes of moderate physical activity every day.    Regular physical activity can lower blood pressure by 5 - 8 mmHg.   Eat a healthy diet Eat a diet rich in whole grains, fruits, vegetables, lean meat, and low-fat dairy products. Limit processed foods, saturated fat, and sweets.    A heart-healthy diet can lower high blood pressure by 10 mmHg.   Reduce salt (sodium) in your diet Aim for 000mg  of sodium each day. Avoid deli meats, canned food, and frozen microwave meals which are high in sodium.     Limiting sodium can reduce blood pressure by 5 mmHg.   Limit alcohol One drink equals 12 ounces of beer, 5 ounces of wine, or 1.5 ounces of 80-proof liquor.    Limiting alcohol to < 1 drink a day for women or < 2 drinks a day for men can help lower blood pressure by about 4 mmHg.   To check your pressure at home you will need to:   Sit up in a chair, with feet flat on the floor  and back supported. Do not cross your ankles or legs. Rest your left arm so that the cuff is about heart level. If the cuff goes on your upper arm, then just relax your arm on the table, arm of the chair, or your lap. If you have a wrist cuff, hold your wrist against your chest at heart level. Place the cuff snugly around your arm, about 1 inch above the crease of your elbow. The cords should be inside the groove of your elbow.  Sit quietly, with the cuff in place, for about 5 minutes. Then press the power button to start a reading. Do not talk or move while the reading is taking place.  Record your readings on a sheet of paper. Although most cuffs have a memory, it is often easier to see a pattern developing when the numbers are all in front of you.  You can repeat the reading after 1-3 minutes if it is recommended.   Make sure your bladder is empty and you have not had caffeine or tobacco within the last 30 minutes   Always bring your blood pressure log with you to your appointments. If you have not brought your monitor in to be double checked for accuracy, please bring it to your next appointment.   You can find a list of validated (  accurate) blood pressure cuffs at: validatebp.org

## 2023-01-11 ENCOUNTER — Other Ambulatory Visit: Payer: Self-pay | Admitting: Interventional Cardiology

## 2023-01-12 NOTE — Progress Notes (Deleted)
Patient ID: SHALYSE KEARLEY                 DOB: Oct 21, 1950                      MRN: [id number]     HPI: Monique Henderson is a 72 y.o. female referred by Dr. Eldridge Dace to HTN clinic. 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 Pt last seen by MD on 11/16/22 where BP was elevated at 150/72. Home SBP in the 150s as well. Imdur was increased to 60mg  on non dialysis days.  Last visit with PharmD 12/18/22. Pt presented with family members who assist with medications. Pt denied sx of hypotension with dialysis. Brought it wrist cuff which was reading ~10 mm Hg lower systolic than clinic cuff (wrist cuff easier to use given fistula for dialysis). Prior readings included (date/time not set): 156/105, 136/82, 160/113,172/121, 113/72, 162/61, 166/72, 121/79, 172/77, 132/72, 163/82, 122/71. Pt recalled better readings in the morning. Sometimes she was late with changing clonidine patch which may be causing elevated BP readings. Also reported itching on clonidine patch. Clonidine patch was changed to oral clonidine 0.1 mg BID. Hydralazine was changed from 75 mg TID (was taking 3 tabs of 25 mg TID) to 100 mg TID for better BP control and to decrease pill burden. Amlodipine, carvedilol, and Imdur were continued. Planned to update time on her BP cuff and bring in to next visit.    Current HTN meds:  Amlodipine 10mg  daily - AM Carvedilol 25mg  BID  Clonidine 0.1 mg BID Hydralazine 100mg  TID - morning, afternoon, bedtime  Imdur 60mg  daily except 30mg  on dialysis days  Previously tried: lisinopril - cough  BP goal: <130/23mmHg  Family History: The patient's family history includes Bone cancer (age of onset: 69) in her sister; Colon cancer (age of onset: 58) in her mother; Diabetes in her  brother, brother, and son; Diabetes (age of onset: 63) in her sister; Hypertension in her brother, brother, mother, sister, and son; Kidney disease in her son; Multiple sclerosis in her son.   Social History: The patient  reports that she quit smoking about 2 years ago. Her smoking use included cigarettes. She has a 11.00 pack-year smoking history. She has never used smokeless tobacco. She reports that she does not currently use alcohol after a past usage of about 2.0 standard drinks of alcohol per week. She reports that she does not currently use drugs after having used the following drugs: Cocaine.   Diet: Doesn't add salt to food. Minimal caffeine.  Wt Readings from Last 3 Encounters:  11/16/22 109 lb (49.4 kg)  11/16/22 108 lb (49 kg)  10/21/22 104 lb 3.2 oz (47.3 kg)   BP Readings from Last 3 Encounters:  12/18/22 (!) 148/86  11/16/22 (!) 150/72  11/16/22 (!) 147/83   Pulse Readings from Last 3 Encounters:  12/18/22 72  11/16/22 82  11/16/22 81    Renal function: CrCl cannot be calculated (Patient's most recent lab result is older than the maximum 21 days allowed.).  Past Medical History:  Diagnosis Date   AF (paroxysmal atrial fibrillation) (HCC) 05/29/2019   on Coumadin   Aortic atherosclerosis (HCC) 07/05/2019   Aortic dissection (HCC) 04/04/2019   s/p repair   Bone spur 2008   Right calcaneal  foot spur   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 Chevis Pretty; S/P re-excision of superior and  inferior margins 10/27/2012.    ESRD on hemodialysis (HCC) 05/29/2019   Essential hypertension 09/16/2006   GERD 09/16/2006   Hepatitis C    treated and RNA confirmed not detectable 01/2017   Insomnia 03/14/2015   Malnutrition of moderate degree 05/19/2019   Non compliance with medical treatment 12/04/2017   Normocytic anemia    With thrombocytosis   Osteoarthritis    Right ureteral stone 2002   S/P lumbar spinal fusion 01/18/2014   S/P lumbar decompressive laminectomy, fusion, and plating for lumbar spinal stenosis on 05/27/2009 by Dr. Tia Alert.  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 Tia Alert.     Stroke (cerebrum) (HCC)    Tobacco use disorder 04/19/2009   Uterine fibroid    Wears dentures    top    Current Outpatient Medications on File Prior to Visit  Medication Sig Dispense Refill   albuterol (VENTOLIN HFA) 108 (90 Base) MCG/ACT inhaler Inhale 2 puffs into the lungs every 6 (six) hours as needed for wheezing or shortness of breath. 8.5 g 0   amLODipine (NORVASC) 10 MG tablet Take 1 tablet (10 mg total) by mouth daily. 30 tablet 0   apixaban (ELIQUIS) 2.5 MG TABS tablet Take 1 tablet (2.5 mg total) by mouth 2 (two) times daily. 60 tablet 0   carvedilol (COREG) 25 MG tablet Take 1 tablet (25 mg total) by mouth 2 (two) times daily with a meal. 60 tablet 1   Cholecalciferol (VITAMIN D-3 PO) Take 1 capsule by mouth daily.     cloNIDine (CATAPRES) 0.1 MG tablet Take 1 tablet (0.1 mg total) by mouth 2 (two) times daily. 60 tablet 11   hydrALAZINE (APRESOLINE) 100 MG tablet Take 1 tablet (100 mg total) by mouth every 8 (eight) hours. 270 tablet 3   isosorbide mononitrate (IMDUR) 30 MG 24 hr tablet TAKE 1 TABLET (30MG ) ON DIALYSIS DAYS AND 2 TABLETS (60 MG) ON NON DIALYSIS DAYS. 45  tablet 10   naloxone (NARCAN) nasal spray 4 mg/0.1 mL 1 spray as directed.     oxyCODONE-acetaminophen (PERCOCET) 10-325 MG tablet Take 1 tablet by mouth 3 (three) times daily as needed for pain.     pantoprazole (PROTONIX) 40 MG tablet TAKE 1 TABLET (40 MG TOTAL) BY MOUTH 2 (TWO) TIMES DAILY. 60 tablet 1   rosuvastatin (CRESTOR) 10 MG tablet TAKE 1 TABLET (10 MG TOTAL) BY MOUTH DAILY FOR CHOLESTEROL (Patient taking differently: Take 10 mg by mouth daily.) 90 tablet 0   senna-docusate (SENOKOT-S) 8.6-50 MG tablet Take 1 tablet by mouth 2 (two) times daily between meals as needed for mild constipation. 180 tablet 0   sevelamer carbonate (RENVELA) 2.4 g PACK Take 2.4 g by mouth 3 (three) times daily with meals. 90  each 0   traZODone (DESYREL) 50 MG tablet Take 0.5 tablets (25 mg total) by mouth at bedtime as needed for sleep. 45 tablet 0   No current facility-administered medications on file prior to visit.    Allergies  Allergen Reactions   Shrimp [Shellfish Allergy] Shortness Of Breath   Bactroban [Mupirocin] Other (See Comments)    "Sores in nose"   Hydrocodone Itching, Nausea Only and Nausea And Vomiting   Chlorhexidine Gluconate Itching   Tylenol [Acetaminophen] Itching   Zestril [Lisinopril] Cough     Assessment/Plan:  1. Hypertension - BP elevated above goal < 130/16mmHg. Home wrist cuff measuring a bit low compared to clinic cuff today (~34mmHg lower systolic). Will increase hydralazine from 75mg  TID to 100mg  TID for better efficacy and to decrease pill burden (will reduce pill count by 6 tabs as she had been taking 3 of the 25mg  tabs TID). Will change clonidine 0.2mg  patch to oral clonidine 0.1mg  BID as she's been having itching with the patch and sometimes changes it late which may be the cause of some of her higher BP readings. She'll continue on her other BP meds including amlodipine 10mg  daily, carvedilol 25mg  BID, and Imdur 60mg  except 30mg  on dialysis days. F/u with me in 4  weeks for BP check. She's updating the time on her BP cuff and will bring this in to review readings at next visit.

## 2023-01-13 ENCOUNTER — Ambulatory Visit: Payer: Medicare Other

## 2023-01-14 ENCOUNTER — Encounter (HOSPITAL_COMMUNITY): Payer: Self-pay

## 2023-01-14 ENCOUNTER — Emergency Department (HOSPITAL_COMMUNITY): Payer: Medicare Other

## 2023-01-14 ENCOUNTER — Inpatient Hospital Stay (HOSPITAL_COMMUNITY)
Admission: EM | Admit: 2023-01-14 | Discharge: 2023-01-16 | DRG: 304 | Disposition: A | Discharge status: Home / Self Care | Payer: Medicare Other | Attending: Student | Admitting: Student

## 2023-01-14 DIAGNOSIS — M1990 Unspecified osteoarthritis, unspecified site: Secondary | ICD-10-CM | POA: Diagnosis present

## 2023-01-14 DIAGNOSIS — I69334 Monoplegia of upper limb following cerebral infarction affecting left non-dominant side: Secondary | ICD-10-CM

## 2023-01-14 DIAGNOSIS — Z8619 Personal history of other infectious and parasitic diseases: Secondary | ICD-10-CM

## 2023-01-14 DIAGNOSIS — Z7901 Long term (current) use of anticoagulants: Secondary | ICD-10-CM

## 2023-01-14 DIAGNOSIS — R000 Tachycardia, unspecified: Secondary | ICD-10-CM | POA: Diagnosis present

## 2023-01-14 DIAGNOSIS — I480 Paroxysmal atrial fibrillation: Secondary | ICD-10-CM | POA: Diagnosis present

## 2023-01-14 DIAGNOSIS — Z885 Allergy status to narcotic agent status: Secondary | ICD-10-CM

## 2023-01-14 DIAGNOSIS — J9601 Acute respiratory failure with hypoxia: Secondary | ICD-10-CM | POA: Diagnosis not present

## 2023-01-14 DIAGNOSIS — J9811 Atelectasis: Secondary | ICD-10-CM | POA: Diagnosis present

## 2023-01-14 DIAGNOSIS — I4891 Unspecified atrial fibrillation: Secondary | ICD-10-CM | POA: Diagnosis not present

## 2023-01-14 DIAGNOSIS — Z1152 Encounter for screening for COVID-19: Secondary | ICD-10-CM | POA: Diagnosis not present

## 2023-01-14 DIAGNOSIS — R0603 Acute respiratory distress: Secondary | ICD-10-CM | POA: Diagnosis not present

## 2023-01-14 DIAGNOSIS — R079 Chest pain, unspecified: Secondary | ICD-10-CM

## 2023-01-14 DIAGNOSIS — E8889 Other specified metabolic disorders: Secondary | ICD-10-CM | POA: Diagnosis present

## 2023-01-14 DIAGNOSIS — I2489 Other forms of acute ischemic heart disease: Secondary | ICD-10-CM | POA: Diagnosis present

## 2023-01-14 DIAGNOSIS — N186 End stage renal disease: Secondary | ICD-10-CM | POA: Diagnosis present

## 2023-01-14 DIAGNOSIS — E8770 Fluid overload, unspecified: Secondary | ICD-10-CM

## 2023-01-14 DIAGNOSIS — I4892 Unspecified atrial flutter: Secondary | ICD-10-CM | POA: Diagnosis present

## 2023-01-14 DIAGNOSIS — R188 Other ascites: Secondary | ICD-10-CM | POA: Diagnosis present

## 2023-01-14 DIAGNOSIS — I959 Hypotension, unspecified: Secondary | ICD-10-CM | POA: Diagnosis present

## 2023-01-14 DIAGNOSIS — Z886 Allergy status to analgesic agent status: Secondary | ICD-10-CM

## 2023-01-14 DIAGNOSIS — Z853 Personal history of malignant neoplasm of breast: Secondary | ICD-10-CM

## 2023-01-14 DIAGNOSIS — Z992 Dependence on renal dialysis: Secondary | ICD-10-CM | POA: Diagnosis not present

## 2023-01-14 DIAGNOSIS — R0602 Shortness of breath: Secondary | ICD-10-CM | POA: Diagnosis present

## 2023-01-14 DIAGNOSIS — J449 Chronic obstructive pulmonary disease, unspecified: Secondary | ICD-10-CM | POA: Diagnosis present

## 2023-01-14 DIAGNOSIS — Z66 Do not resuscitate: Secondary | ICD-10-CM | POA: Diagnosis not present

## 2023-01-14 DIAGNOSIS — I5023 Acute on chronic systolic (congestive) heart failure: Secondary | ICD-10-CM

## 2023-01-14 DIAGNOSIS — Z981 Arthrodesis status: Secondary | ICD-10-CM

## 2023-01-14 DIAGNOSIS — I5043 Acute on chronic combined systolic (congestive) and diastolic (congestive) heart failure: Secondary | ICD-10-CM | POA: Diagnosis not present

## 2023-01-14 DIAGNOSIS — Z87891 Personal history of nicotine dependence: Secondary | ICD-10-CM

## 2023-01-14 DIAGNOSIS — Z91013 Allergy to seafood: Secondary | ICD-10-CM

## 2023-01-14 DIAGNOSIS — Z91158 Patient's noncompliance with renal dialysis for other reason: Secondary | ICD-10-CM

## 2023-01-14 DIAGNOSIS — I132 Hypertensive heart and chronic kidney disease with heart failure and with stage 5 chronic kidney disease, or end stage renal disease: Secondary | ICD-10-CM | POA: Diagnosis present

## 2023-01-14 DIAGNOSIS — I161 Hypertensive emergency: Secondary | ICD-10-CM | POA: Diagnosis not present

## 2023-01-14 DIAGNOSIS — Z888 Allergy status to other drugs, medicaments and biological substances status: Secondary | ICD-10-CM

## 2023-01-14 DIAGNOSIS — D631 Anemia in chronic kidney disease: Secondary | ICD-10-CM | POA: Diagnosis present

## 2023-01-14 DIAGNOSIS — Z79899 Other long term (current) drug therapy: Secondary | ICD-10-CM

## 2023-01-14 DIAGNOSIS — E876 Hypokalemia: Secondary | ICD-10-CM | POA: Diagnosis present

## 2023-01-14 DIAGNOSIS — Z7189 Other specified counseling: Secondary | ICD-10-CM | POA: Diagnosis not present

## 2023-01-14 DIAGNOSIS — Z8249 Family history of ischemic heart disease and other diseases of the circulatory system: Secondary | ICD-10-CM

## 2023-01-14 DIAGNOSIS — I719 Aortic aneurysm of unspecified site, without rupture: Secondary | ICD-10-CM | POA: Diagnosis present

## 2023-01-14 DIAGNOSIS — K219 Gastro-esophageal reflux disease without esophagitis: Secondary | ICD-10-CM | POA: Diagnosis present

## 2023-01-14 DIAGNOSIS — G8929 Other chronic pain: Secondary | ICD-10-CM | POA: Diagnosis present

## 2023-01-14 DIAGNOSIS — Z881 Allergy status to other antibiotic agents status: Secondary | ICD-10-CM

## 2023-01-14 DIAGNOSIS — Z87442 Personal history of urinary calculi: Secondary | ICD-10-CM

## 2023-01-14 DIAGNOSIS — Z883 Allergy status to other anti-infective agents status: Secondary | ICD-10-CM

## 2023-01-14 LAB — I-STAT VENOUS BLOOD GAS, ED
Acid-Base Excess: 6 mmol/L — ABNORMAL HIGH (ref 0.0–2.0)
Bicarbonate: 28.8 mmol/L — ABNORMAL HIGH (ref 20.0–28.0)
Calcium, Ion: 1.06 mmol/L — ABNORMAL LOW (ref 1.15–1.40)
HCT: 32 % — ABNORMAL LOW (ref 36.0–46.0)
Hemoglobin: 10.9 g/dL — ABNORMAL LOW (ref 12.0–15.0)
O2 Saturation: 99 %
Potassium: 4.4 mmol/L (ref 3.5–5.1)
Sodium: 139 mmol/L (ref 135–145)
TCO2: 30 mmol/L (ref 22–32)
pCO2, Ven: 34.5 mmHg — ABNORMAL LOW (ref 44–60)
pH, Ven: 7.53 — ABNORMAL HIGH (ref 7.25–7.43)
pO2, Ven: 120 mmHg — ABNORMAL HIGH (ref 32–45)

## 2023-01-14 LAB — CBC
HCT: 31 % — ABNORMAL LOW (ref 36.0–46.0)
HCT: 26.7 % — ABNORMAL LOW (ref 36.0–46.0)
Hemoglobin: 9.6 g/dL — ABNORMAL LOW (ref 12.0–15.0)
Hemoglobin: 8.4 g/dL — ABNORMAL LOW (ref 12.0–15.0)
MCH: 28.9 pg (ref 26.0–34.0)
MCH: 29.2 pg (ref 26.0–34.0)
MCHC: 31 g/dL (ref 30.0–36.0)
MCHC: 31.5 g/dL (ref 30.0–36.0)
MCV: 93.4 fL (ref 80.0–100.0)
MCV: 92.7 fL (ref 80.0–100.0)
Platelets: 195 10*3/uL (ref 150–400)
Platelets: 167 10*3/uL (ref 150–400)
RBC: 3.32 MIL/uL — ABNORMAL LOW (ref 3.87–5.11)
RBC: 2.88 MIL/uL — ABNORMAL LOW (ref 3.87–5.11)
RDW: 17.2 % — ABNORMAL HIGH (ref 11.5–15.5)
RDW: 17.2 % — ABNORMAL HIGH (ref 11.5–15.5)
WBC: 7.2 10*3/uL (ref 4.0–10.5)
WBC: 5.8 10*3/uL (ref 4.0–10.5)
nRBC: 0 % (ref 0.0–0.2)
nRBC: 0 % (ref 0.0–0.2)

## 2023-01-14 LAB — RENAL FUNCTION PANEL
Albumin: 3.3 g/dL — ABNORMAL LOW (ref 3.5–5.0)
Anion gap: 15 (ref 5–15)
BUN: 46 mg/dL — ABNORMAL HIGH (ref 8–23)
CO2: 27 mmol/L (ref 22–32)
Calcium: 9.1 mg/dL (ref 8.9–10.3)
Chloride: 97 mmol/L — ABNORMAL LOW (ref 98–111)
Creatinine, Ser: 9.22 mg/dL — ABNORMAL HIGH (ref 0.44–1.00)
GFR, Estimated: 4 mL/min — ABNORMAL LOW (ref >60)
Glucose, Bld: 100 mg/dL — ABNORMAL HIGH (ref 70–99)
Phosphorus: 6.3 mg/dL — ABNORMAL HIGH (ref 2.5–4.6)
Potassium: 4.6 mmol/L (ref 3.5–5.1)
Sodium: 139 mmol/L (ref 135–145)

## 2023-01-14 LAB — GLUCOSE, CAPILLARY: Glucose-Capillary: 82 mg/dL (ref 70–99)

## 2023-01-14 LAB — BASIC METABOLIC PANEL
Anion gap: 17 — ABNORMAL HIGH (ref 5–15)
BUN: 42 mg/dL — ABNORMAL HIGH (ref 8–23)
CO2: 24 mmol/L (ref 22–32)
Calcium: 9.4 mg/dL (ref 8.9–10.3)
Chloride: 97 mmol/L — ABNORMAL LOW (ref 98–111)
Creatinine, Ser: 8.91 mg/dL — ABNORMAL HIGH (ref 0.44–1.00)
GFR, Estimated: 4 mL/min — ABNORMAL LOW (ref >60)
Glucose, Bld: 99 mg/dL (ref 70–99)
Potassium: 4.4 mmol/L (ref 3.5–5.1)
Sodium: 138 mmol/L (ref 135–145)

## 2023-01-14 LAB — TROPONIN I (HIGH SENSITIVITY)
Troponin I (High Sensitivity): 66 ng/L — ABNORMAL HIGH (ref <18)
Troponin I (High Sensitivity): 62 ng/L — ABNORMAL HIGH (ref <18)

## 2023-01-14 LAB — HIV ANTIBODY (ROUTINE TESTING W REFLEX): HIV Screen 4th Generation wRfx: NONREACTIVE

## 2023-01-14 LAB — SARS CORONAVIRUS 2 BY RT PCR: SARS Coronavirus 2 by RT PCR: NEGATIVE

## 2023-01-14 LAB — HEPATITIS B SURFACE ANTIGEN: Hepatitis B Surface Ag: NONREACTIVE

## 2023-01-14 LAB — BRAIN NATRIURETIC PEPTIDE: B Natriuretic Peptide: >4500 pg/mL — ABNORMAL HIGH (ref 0.0–100.0)

## 2023-01-14 MED ORDER — IPRATROPIUM-ALBUTEROL 0.5-2.5 (3) MG/3ML IN SOLN
3.0000 mL | Freq: Once | RESPIRATORY_TRACT | Status: AC
  Administered 2023-01-14: 3 mL via RESPIRATORY_TRACT
  Filled 2023-01-14: qty 6

## 2023-01-14 MED ORDER — APIXABAN 2.5 MG PO TABS
2.5000 mg | ORAL_TABLET | Freq: Two times a day (BID) | ORAL | Status: DC
  Administered 2023-01-15 – 2023-01-16 (×3): 2.5 mg via ORAL
  Filled 2023-01-14 (×3): qty 1

## 2023-01-14 MED ORDER — PENTAFLUOROPROP-TETRAFLUOROETH EX AERO: 1.0000 | INHALATION_SPRAY | CUTANEOUS | Status: DC | PRN

## 2023-01-14 MED ORDER — ROSUVASTATIN CALCIUM 5 MG PO TABS
10.0000 mg | ORAL_TABLET | Freq: Every day | ORAL | Status: DC
  Administered 2023-01-15: 10 mg via ORAL
  Filled 2023-01-14 (×2): qty 2

## 2023-01-14 MED ORDER — ALBUTEROL SULFATE HFA 108 (90 BASE) MCG/ACT IN AERS: 2.0000 | INHALATION_SPRAY | Freq: Four times a day (QID) | RESPIRATORY_TRACT | Status: DC | PRN

## 2023-01-14 MED ORDER — HYDRALAZINE HCL 20 MG/ML IJ SOLN
5.0000 mg | INTRAMUSCULAR | Status: DC | PRN
  Administered 2023-01-15 (×2): 5 mg via INTRAVENOUS
  Filled 2023-01-14 (×2): qty 1

## 2023-01-14 MED ORDER — LIDOCAINE HCL (PF) 1 % IJ SOLN: 5.0000 mL | INTRAMUSCULAR | Status: DC | PRN

## 2023-01-14 MED ORDER — PANTOPRAZOLE SODIUM 40 MG PO TBEC
40.0000 mg | DELAYED_RELEASE_TABLET | Freq: Two times a day (BID) | ORAL | Status: DC
  Administered 2023-01-15 – 2023-01-16 (×3): 40 mg via ORAL
  Filled 2023-01-14 (×3): qty 1

## 2023-01-14 MED ORDER — DOCUSATE SODIUM 100 MG PO CAPS: 100.0000 mg | ORAL_CAPSULE | Freq: Two times a day (BID) | ORAL | Status: DC | PRN

## 2023-01-14 MED ORDER — IOHEXOL 350 MG/ML SOLN
75.0000 mL | Freq: Once | INTRAVENOUS | Status: AC | PRN
  Administered 2023-01-14: 75 mL via INTRAVENOUS

## 2023-01-14 MED ORDER — CARVEDILOL 12.5 MG PO TABS
25.0000 mg | ORAL_TABLET | Freq: Two times a day (BID) | ORAL | Status: DC
  Administered 2023-01-15 (×2): 25 mg via ORAL
  Filled 2023-01-14 (×2): qty 2

## 2023-01-14 MED ORDER — POLYETHYLENE GLYCOL 3350 17 G PO PACK: 17.0000 g | PACK | Freq: Every day | ORAL | Status: DC | PRN

## 2023-01-14 MED ORDER — LIDOCAINE-PRILOCAINE 2.5-2.5 % EX CREA: 1.0000 | TOPICAL_CREAM | CUTANEOUS | Status: DC | PRN

## 2023-01-14 MED ORDER — METOPROLOL TARTRATE 5 MG/5ML IV SOLN
2.5000 mg | INTRAVENOUS | Status: DC | PRN
  Administered 2023-01-14: 2.5 mg via INTRAVENOUS
  Filled 2023-01-14: qty 5

## 2023-01-14 MED ORDER — CLONIDINE HCL 0.1 MG PO TABS
0.1000 mg | ORAL_TABLET | Freq: Two times a day (BID) | ORAL | Status: DC
  Administered 2023-01-15 – 2023-01-16 (×3): 0.1 mg via ORAL
  Filled 2023-01-14 (×3): qty 1

## 2023-01-14 MED ORDER — ALTEPLASE 2 MG IJ SOLR: 2.0000 mg | Freq: Once | INTRAMUSCULAR | Status: DC | PRN

## 2023-01-14 MED ORDER — ALBUTEROL SULFATE (2.5 MG/3ML) 0.083% IN NEBU: 2.5000 mg | INHALATION_SOLUTION | Freq: Four times a day (QID) | RESPIRATORY_TRACT | Status: DC | PRN

## 2023-01-14 MED ORDER — HYDRALAZINE HCL 50 MG PO TABS
100.0000 mg | ORAL_TABLET | Freq: Three times a day (TID) | ORAL | Status: DC
  Administered 2023-01-15 – 2023-01-16 (×4): 100 mg via ORAL
  Filled 2023-01-14 (×4): qty 2

## 2023-01-14 MED ORDER — HYDRALAZINE HCL 20 MG/ML IJ SOLN
10.0000 mg | Freq: Once | INTRAMUSCULAR | Status: AC
  Administered 2023-01-14: 10 mg via INTRAVENOUS
  Filled 2023-01-14: qty 1

## 2023-01-14 MED ORDER — NITROGLYCERIN IN D5W 200-5 MCG/ML-% IV SOLN
0.0000 ug/min | INTRAVENOUS | Status: DC
  Administered 2023-01-14: 100 ug/min via INTRAVENOUS
  Administered 2023-01-15: 166.667 ug/min via INTRAVENOUS
  Administered 2023-01-15: 100 ug/min via INTRAVENOUS
  Administered 2023-01-15: 150 ug/min via INTRAVENOUS
  Filled 2023-01-14 (×4): qty 250

## 2023-01-14 MED ORDER — FENTANYL CITRATE PF 50 MCG/ML IJ SOSY
50.0000 ug | PREFILLED_SYRINGE | Freq: Once | INTRAMUSCULAR | Status: AC
  Administered 2023-01-14: 50 ug via INTRAVENOUS
  Filled 2023-01-14: qty 1

## 2023-01-14 MED ORDER — HEPARIN SODIUM (PORCINE) 1000 UNIT/ML DIALYSIS
1000.0000 [IU] | INTRAMUSCULAR | Status: DC | PRN
  Administered 2023-01-16: 1000 [IU]
  Filled 2023-01-14 (×8): qty 1

## 2023-01-14 MED ORDER — AMLODIPINE BESYLATE 5 MG PO TABS
10.0000 mg | ORAL_TABLET | Freq: Every day | ORAL | Status: DC
  Administered 2023-01-15: 10 mg via ORAL
  Filled 2023-01-14: qty 2

## 2023-01-14 MED ORDER — ANTICOAGULANT SODIUM CITRATE 4% (200MG/5ML) IV SOLN
5.0000 mL | Status: DC | PRN
  Filled 2023-01-14: qty 5

## 2023-01-14 NOTE — Progress Notes (Signed)
Transported pt from ED11 to CT2 and back with bedside RN on BIPaP. Vitals are stable.

## 2023-01-14 NOTE — H&P (Addendum)
NAME:  Monique Henderson, MRN:  [id number], DOB:  20-Dec-1950, LOS: 0 ADMISSION DATE:  01/14/2023, CONSULTATION DATE:  8/15 REFERRING MD:  Dr Suezanne Jacquet, CHIEF COMPLAINT:  respiratory distress   History of Present Illness:  72 year old femlae with PMH as below, which is significant for ESRD on HD, AF on Eliquis, HFrEF, COPD, Hep C, and HTN. She presented to [facility] 8/15 with respiratory distress after missing HD 8/15. She most recently went to HD 8/13 and had a full treatment with 3.5L removed. Documented history of medical/HD noncompliance. Also complained of chest pain worse with deep breathing. No productive cough/fevers or sick contacts noted. Upon arrival to the emergency department she was noted to be profoundly hypertensive with BP 229/122 with significant work of breathing. She was placed on supplemental O2 and ultimately BiPAP for work of breathing. CXR showed diffuse pulmonary edema. Nitroglycerine infusion was initiated. Nephrology was consulted for urgent HD, which was scheduled. She did become relatively hypotensive in the ED and nitroglycerin was held. PCCM was asked to see for admission to the ICU.   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  Significant Hospital Events: Including procedures, antibiotic start and stop dates in addition to other pertinent events   8/15 admit for hypertensive crisis after missing HD.   Interim History / Subjective:    Objective   Blood pressure 118/89, pulse (!) 157, temperature 98.4 F (36.9 C), temperature source Oral, resp. rate (!) 32, SpO2 100%.    FiO2 (%):  [60 %] 60 %  No intake or output data in the 24 hours ending 01/14/23 1935 There were no vitals filed for this visit.  Examination: General: Elderly appearing female in AND HENT: [place]/AT, PERRL, JVD to mandible.  Lungs: Clear bilateral breath sounds on BiPAP Cardiovascular: Tachy, regular, no MRG Abdomen: Soft, NT, ND Extremities: No acute deformity or ROM limitation. No edema Neuro: Alert, oriented, non-focal  Resolved Hospital Problem list     Assessment & Plan:   Hypertensive Crisis - Admit to ICU - SBP goal 160-180 mmHg - SBP dropped to 118 on NTG, which is now being held - For urgent HD tonight - Resume home norvasc, coreg, clonidine, hydralazine, imdur  Atrial fib with RVR - will give 5 mg metoprolol now IV - home medication to continue as above - Eliquis  Acute on chronic HFrEF: LVEF 45-50% - [place] medications as above - Volume removal with HD - Telemetry monitoring  Dyspnea: pulmonary edema - BiPAP PRN - Should improve with HD  Chest pain: improved.  worse with deep inspiration and deep breathing. No signs of ischemia on EKG. Troponin 66 > 62. Aortic aneurysm unchanged on CTA.  - Supportive care.   COPD without acute exacerbation - no home medications besides PRN albuterol.  - PRN albuterol nebs  ESRD on  HD - appreciate nephrology - For HD tonight in ICU    Best Practice (right click and "Reselect all SmartList  Selections" daily)   Diet/type: NPO DVT prophylaxis: DOAC GI prophylaxis: N/A Lines: N/A HD perm cath Foley:  N/A Code Status:  full code Last date of multidisciplinary goals of care discussion [  ]  Labs   CBC: Recent Labs  Lab 01/14/23 1545 01/14/23 1553  WBC 7.2  --   HGB 9.6* 10.9*  HCT 31.0* 32.0*  MCV 93.4  --   PLT 195  --     Basic Metabolic Panel: Recent Labs  Lab 01/14/23 1545 01/14/23 1553  NA 138 139  K 4.4 4.4  CL 97*  --   CO2 24  --   GLUCOSE 99  --   BUN 42*  --   CREATININE 8.91*  --   CALCIUM 9.4  --    GFR: CrCl cannot be calculated (Unknown ideal weight.). Recent Labs  Lab 01/14/23 1545  WBC 7.2    Liver Function Tests: No results for input(s): "AST", "ALT", "ALKPHOS", "BILITOT", "PROT", "ALBUMIN" in the last 168 hours. No results for input(s): "LIPASE", "AMYLASE" in the last 168 hours. No results for input(s): "AMMONIA" in the last 168 hours.  ABG    Component Value Date/Time   PHART 7.508 (H) 01/29/2022 1132   PCO2ART 38.0 01/29/2022 1132   PO2ART 61 (L) 01/29/2022 1132   HCO3 28.8 (H) 01/14/2023 1553   TCO2 30 01/14/2023 1553   ACIDBASEDEF 8.0 (H) 04/06/2019 1748   O2SAT 99 01/14/2023 1553     Coagulation Profile: No results for input(s): "INR", "PROTIME" in the last 168 hours.  Cardiac Enzymes: No results for input(s): "CKTOTAL", "CKMB", "CKMBINDEX", "TROPONINI" in the last 168 hours.  HbA1C: Hgb A1c MFr Bld  Date/Time Value Ref Range Status  02/23/2022 06:46 PM 4.7 (L) 4.8 - 5.6 % Final    Comment:    (NOTE) Pre diabetes:          5.7%-6.4%  Diabetes:              >6.4%  Glycemic control for   <7.0% adults with diabetes   06/20/2021 06:08 PM 5.1 4.8 - 5.6 % Final    Comment:    (NOTE)         Prediabetes: 5.7 - 6.4         Diabetes: >6.4         Glycemic control for adults with diabetes: <7.0     CBG: No results for input(s): "GLUCAP" in the last 168 hours.  Review of Systems:    Bolds are positive   Constitutional: weight loss, gain, night sweats, Fevers, chills, fatigue .  HEENT: headaches, Sore throat, sneezing, nasal congestion, post nasal drip, Difficulty swallowing, Tooth/dental problems, visual complaints visual changes, ear ache CV:  chest pain, radiates:,Orthopnea, PND, swelling in lower extremities, dizziness, palpitations, syncope.  GI  heartburn, indigestion, abdominal pain, nausea, vomiting, diarrhea, change in bowel habits, loss of appetite, bloody stools.  Resp: cough, productive: , hemoptysis, dyspnea, chest pain, pleuritic.  Skin: rash or itching or icterus GU: dysuria, change in color of urine, urgency or frequency. flank pain, hematuria  MS: joint pain or swelling. decreased range of motion  Psych: change in mood or affect. depression or anxiety.  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   Surgical History:   Past Surgical History:  Procedure Laterality Date   ANTERIOR LAT LUMBAR FUSION N/A  01/18/2014   Procedure: ANTERIOR LATERAL LUMBAR FUSION LUMBAR TWO-THREE;  Surgeon: Tia Alert, MD;  Location: [facility];  Service: Neurosurgery;  Laterality: N/A;   ANTERIOR LUMBAR FUSION  01/18/2014   AV FISTULA PLACEMENT Left 04/20/2019   Procedure: ARTERIOVENOUS (AV) FISTULA CREATION;  Surgeon: Maeola Harman, MD;  Location: [facility];  Service: Vascular;  Laterality: Left;   BACK SURGERY     BREAST LUMPECTOMY Left 01/2003   BREAST LUMPECTOMY Right 2014   BREAST LUMPECTOMY WITH NEEDLE LOCALIZATION AND AXILLARY SENTINEL LYMPH NODE BX Right 10/13/2012   Procedure: BREAST LUMPECTOMY WITH NEEDLE LOCALIZATION;  Surgeon: Robyne Askew, MD;  Location: [facility];  Service: General;  Laterality: Right;  Right breast wire localized lumpectomy   INSERTION OF DIALYSIS CATHETER Right 04/20/2019   Procedure: INSERTION OF DIALYSIS CATHETER, right internal jugular;  Surgeon: Maeola Harman, MD;  Location: [facility] OR;  Service: Vascular;  Laterality: Right;   INSERTION OF DIALYSIS CATHETER Right 09/24/2020   Procedure: INSERTION OF TUNNELED DIALYSIS CATHETER;  Surgeon: Larina Earthly, MD;  Location: [facility] OR;  Service: Vascular;  Laterality: Right;   IR FLUORO GUIDE CV LINE RIGHT  09/22/2020   IR THORACENTESIS ASP PLEURAL SPACE W/IMG GUIDE  05/19/2019   IR US GUIDE VASC ACCESS LEFT  09/22/2020   IR US GUIDE VASC ACCESS RIGHT  09/22/2020   IR VENOCAVAGRAM SVC  09/22/2020   LAMINECTOMY  05/27/2009   Lumbar decompressive laminectomy, fusion and plating for lumbar spinal stensosis   LIGATION OF ARTERIOVENOUS  FISTULA Left 09/24/2020   Procedure: LIGATION OF LEFT ARM ARTERIOVENOUS  FISTULA;  Surgeon: Larina Earthly, MD;  Location: [facility] OR;  Service: Vascular;  Laterality: Left;   LUMBAR LAMINECTOMY/DECOMPRESSION MICRODISCECTOMY Left 03/23/2013   Procedure: LUMBAR LAMINECTOMY/DECOMPRESSION MICRODISCECTOMY 1 LEVEL;  Surgeon: Tia Alert, MD;  Location: [facility];  Service: Neurosurgery;   Laterality: Left;  LUMBAR LAMINECTOMY/DECOMPRESSION MICRODISCECTOMY 1 LEVEL   MASTECTOMY, PARTIAL Left 02/26/2003   ; S/P re-excision of cranial and lateral margins 04/19/2003.    RE-EXCISION OF BREAST CANCER,SUPERIOR MARGINS Right 10/27/2012   Procedure: RE-EXCISION OF BREAST CANCER,SUPERIOR and inferior MARGINS;  Surgeon: Robyne Askew, MD;  Location: [facility] OR;  Service: General;  Laterality: Right;   RE-EXCISION OF BREAST LUMPECTOMY Left 04/2003   TEE WITHOUT CARDIOVERSION N/A 04/04/2019   Procedure: Transesophageal Echocardiogram (Tee);  Surgeon: Linden Dolin, MD;  Location: [facility] OR;  Service: Open Heart Surgery;  Laterality: N/A;   THORACIC AORTIC ANEURYSM REPAIR N/A 04/04/2019   Procedure: THORACIC ASCENDING ANEURYSM REPAIR (AAA)  USING 28 MM X 30 CM HEMASHIELD PLATINUM VASCULAR GRAFT;  Surgeon: Linden Dolin, MD;  Location: [facility] OR;  Service: Open Heart Surgery;  Laterality: N/A;     Social History:  reports that she quit smoking about 2 years ago. Her smoking use included cigarettes. She started smoking about 46 years ago. She has a 11 pack-year smoking history. She has never used smokeless tobacco. She reports that she does not currently use alcohol after a past usage of about 2.0 standard drinks of alcohol per week. She reports that she does not currently use drugs after having used the following drugs: Cocaine.   Family History:  Her family history includes Bone cancer (age of onset: 46) in her sister; Colon cancer (age of onset: 79) in her mother; Diabetes in her brother, brother, and son; Diabetes (age of onset: 43) in her sister; Hypertension in her brother, brother, mother, sister, and son; Kidney disease in her son; Multiple sclerosis in her son. There is no history of Breast cancer or Cervical cancer.   Allergies Allergies  Allergen Reactions   Shrimp [Shellfish Allergy] Shortness Of Breath   Bactroban [Mupirocin] Other (See Comments)    "Sores in nose"   Hydrocodone  Itching, Nausea Only and Nausea And Vomiting   Chlorhexidine Gluconate Itching   Tylenol [Acetaminophen] Itching   Zestril [Lisinopril] Cough     Home Medications  Prior to Admission medications   Medication Sig Start Date End Date Taking? Authorizing Provider  albuterol (VENTOLIN HFA) 108 (90 Base) MCG/ACT inhaler Inhale 2 puffs into the lungs every 6 (six) hours as needed for wheezing or shortness of breath. 07/30/20   Janeece Agee, NP  amLODipine (NORVASC) 10 MG tablet Take 1 tablet (10 mg total) by mouth daily. 03/06/22   Setzer, Lynnell Jude, PA-C  apixaban (ELIQUIS) 2.5 MG TABS tablet Take 1 tablet (2.5 mg total) by mouth 2 (two) times daily. 03/06/22   Setzer, Lynnell Jude, PA-C  carvedilol (COREG) 25 MG tablet Take 1 tablet (25 mg total) by mouth 2 (two) times daily with a meal. 05/01/22 05/02/23  Ghimire, Werner Lean, MD  Cholecalciferol (VITAMIN D-3 PO) Take 1 capsule by mouth daily.    [provider]  cloNIDine (CATAPRES) 0.1 MG tablet Take 1 tablet (0.1 mg total) by mouth 2 (two) times daily. 12/18/22   Corky Crafts, MD  hydrALAZINE (APRESOLINE) 100 MG tablet Take 1 tablet (100 mg total) by mouth every 8 (eight) hours. 12/18/22   Corky Crafts, MD  isosorbide mononitrate (IMDUR) 30 MG 24 hr tablet TAKE 1 TABLET (30MG ) ON DIALYSIS DAYS AND 2 TABLETS (60 MG) ON NON DIALYSIS DAYS. 01/12/23   Corky Crafts, MD  naloxone [facility]) nasal spray 4 mg/0.1 mL 1 spray as directed. 02/19/22   [provider]  oxyCODONE-acetaminophen (PERCOCET) 10-325 MG tablet Take 1 tablet by mouth 3 (three) times daily as needed for pain. 03/12/22   [provider]  pantoprazole (PROTONIX) 40 MG tablet TAKE 1 TABLET (40 MG TOTAL) BY MOUTH 2 (TWO) TIMES DAILY. 10/08/22   Georganna Skeans, MD  rosuvastatin (CRESTOR) 10 MG tablet TAKE 1 TABLET (10 MG TOTAL) BY MOUTH DAILY FOR CHOLESTEROL Patient taking differently: Take 10 mg by mouth daily. 08/07/22   Georganna Skeans, MD  senna-docusate  (SENOKOT-S) 8.6-50 MG tablet Take 1 tablet by mouth 2 (two) times daily between meals as needed for mild constipation. 08/14/22   Almon Hercules, MD  sevelamer carbonate (RENVELA) 2.4 g PACK Take 2.4 g by mouth 3 (three) times daily with meals. 03/06/22   Setzer, Lynnell Jude, PA-C  traZODone (DESYREL) 50 MG tablet Take 0.5 tablets (25 mg total) by mouth at bedtime as needed for  sleep. 04/20/22   Georganna Skeans, MD     Critical care time: 43 minutes     Joneen Roach, AGACNP-BC [facility]  See Amion for personal pager PCCM on call pager [phone] until 7pm. Please call Elink 7p-7a. [phone]  01/14/2023 8:10 PM

## 2023-01-14 NOTE — Consult Note (Signed)
[name]: Reason for Consult: To manage dialysis and dialysis related needs Referring Physician: Dr. Eloise Harman, [name]  HPI:  Monique Henderson is an 72 y.o. female with past medical history significant for hypertension, A-fib, aortic dissection is status postrepair, ESRD on HD, anemia who presents with shortness of breath seen as a consultation for the management of ESRD. As per patient she started feeling short of breath since last night associated with some chest discomfort.  Denies fever, chills, cough, nausea or vomiting.   She has been missing outpatient HD treatment often.  The last HD was on 8/13 however she missed HD today because of not feeling well.  In the ER, blood pressure was 229/122, and room air.  The labs showed potassium 4.4, BNP more than 4500, troponin 66, hemoglobin 10.9.  Chest x-ray was done, the reading is pending.  Plan to start nitroglycerin IV drip for the management of hypertensive emergency.  Pending CT angio. Currently on BiPAP for hypoxia and short of breath.  Detailed review of system is difficult to obtain however continue to reports short of breath.  Nursing staff swear present at the bedside in ER. OP HD orders: Dialyzes at  [facility], TTS, 3.5 hr, EDW  46.8 kg 400/Autoflow 1.5 HD Bath: 2K, 2 ca  [facility] TDC Mircera 100 mcg on 8/13, venofer 50 mg weekly Hectorol 9 mcg three times per week No Heparin.  Past Medical History:  Diagnosis Date   AF (paroxysmal atrial fibrillation) (HCC) 05/29/2019   on Coumadin   Aortic atherosclerosis (HCC) 07/05/2019   Aortic dissection (HCC) 04/04/2019   s/p repair   Bone spur 2008   Right calcaneal foot spur   Breast cancer (HCC) 2004   Ductal carcinoma in situ of the left breast; S/P left partial mastectomy 02/26/2003; S/P re-excision of cranial and lateral margins11/18/2004.radiation   Carotid artery disease (HCC)    Cerebral thrombosis with cerebral infarction 05/22/2019   Chronic  HFrEF (heart failure with reduced ejection fraction) (HCC)    Chronic low back pain 06/22/2016   Chronic obstructive lung disease (HCC) 01/16/2017   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 Chevis Pretty; S/P re-excision of superior and inferior margins 10/27/2012.    ESRD on hemodialysis (HCC) 05/29/2019   Essential hypertension 09/16/2006   GERD 09/16/2006   Hepatitis C    treated and RNA confirmed not detectable 01/2017   Insomnia 03/14/2015   Malnutrition of moderate degree 05/19/2019   Non compliance with medical treatment 12/04/2017   Normocytic anemia    With thrombocytosis   Osteoarthritis    Right ureteral stone 2002   S/P lumbar spinal fusion 01/18/2014   S/P lumbar decompressive laminectomy, fusion, and plating for lumbar spinal stenosis on 05/27/2009 by Dr. Tia Alert.  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 Tia Alert.     Stroke (cerebrum) (HCC)    Tobacco use disorder 04/19/2009   Uterine fibroid    Wears dentures    top    Past Surgical History:  Procedure Laterality Date   ANTERIOR LAT LUMBAR FUSION N/A 01/18/2014   Procedure: ANTERIOR LATERAL LUMBAR FUSION LUMBAR TWO-THREE;  Surgeon: Tia Alert, MD;  Location: [facility];  Service: Neurosurgery;  Laterality: N/A;   ANTERIOR LUMBAR FUSION  01/18/2014   AV FISTULA PLACEMENT Left 04/20/2019   Procedure: ARTERIOVENOUS (AV) FISTULA CREATION;  Surgeon: Maeola Harman, MD;  Location: [facility] OR;  Service: Vascular;  Laterality: Left;   BACK SURGERY     BREAST LUMPECTOMY Left 01/2003   BREAST LUMPECTOMY Right 2014   BREAST LUMPECTOMY WITH NEEDLE LOCALIZATION AND AXILLARY SENTINEL LYMPH NODE BX Right 10/13/2012   Procedure: BREAST LUMPECTOMY WITH NEEDLE  LOCALIZATION;  Surgeon: Robyne Askew, MD;  Location: [facility];  Service: General;  Laterality: Right;  Right breast wire localized lumpectomy   INSERTION OF DIALYSIS CATHETER Right 04/20/2019   Procedure: INSERTION OF DIALYSIS CATHETER, right internal jugular;  Surgeon: Maeola Harman, MD;  Location: [facility] OR;  Service: Vascular;  Laterality: Right;   INSERTION OF DIALYSIS CATHETER Right 09/24/2020   Procedure: INSERTION OF TUNNELED DIALYSIS CATHETER;  Surgeon: Larina Earthly, MD;  Location: [facility] OR;  Service: Vascular;  Laterality: Right;   IR FLUORO GUIDE CV LINE RIGHT  09/22/2020   IR THORACENTESIS ASP PLEURAL SPACE W/IMG GUIDE  05/19/2019   IR US GUIDE VASC ACCESS LEFT  09/22/2020   IR US GUIDE VASC ACCESS RIGHT  09/22/2020   IR VENOCAVAGRAM SVC  09/22/2020   LAMINECTOMY  05/27/2009   Lumbar decompressive laminectomy, fusion and plating for lumbar spinal stensosis   LIGATION OF ARTERIOVENOUS  FISTULA Left 09/24/2020   Procedure: LIGATION OF LEFT ARM ARTERIOVENOUS  FISTULA;  Surgeon: Larina Earthly, MD;  Location: [facility] OR;  Service: Vascular;  Laterality: Left;   LUMBAR LAMINECTOMY/DECOMPRESSION MICRODISCECTOMY Left 03/23/2013   Procedure: LUMBAR LAMINECTOMY/DECOMPRESSION MICRODISCECTOMY 1 LEVEL;  Surgeon: Tia Alert, MD;  Location: [facility];  Service: Neurosurgery;  Laterality: Left;  LUMBAR LAMINECTOMY/DECOMPRESSION MICRODISCECTOMY 1 LEVEL   MASTECTOMY, PARTIAL Left 02/26/2003   ; S/P re-excision of cranial and lateral margins 04/19/2003.    RE-EXCISION OF BREAST CANCER,SUPERIOR MARGINS Right 10/27/2012   Procedure: RE-EXCISION OF BREAST CANCER,SUPERIOR and inferior MARGINS;  Surgeon: Robyne Askew, MD;  Location: [facility] OR;  Service: General;  Laterality: Right;   RE-EXCISION OF BREAST LUMPECTOMY Left 04/2003   TEE WITHOUT CARDIOVERSION N/A 04/04/2019   Procedure: Transesophageal Echocardiogram (Tee);  Surgeon: Linden Dolin, MD;  Location: [facility] OR;  Service: Open  Heart Surgery;  Laterality: N/A;   THORACIC AORTIC ANEURYSM REPAIR N/A 04/04/2019   Procedure: THORACIC ASCENDING ANEURYSM REPAIR (AAA)  USING 28 MM X 30 CM HEMASHIELD PLATINUM VASCULAR GRAFT;  Surgeon: Linden Dolin, MD;  Location: [facility] OR;  Service: Open Heart Surgery;  Laterality: N/A;    Family History  Problem Relation Age of Onset   Colon cancer Mother 24   Hypertension Mother    Diabetes Sister 68   Hypertension Sister    Diabetes Brother    Hypertension Brother    Diabetes Brother    Hypertension Brother    Kidney disease Son        s/p renal transplant   Hypertension Son    Diabetes Son    Multiple sclerosis Son    Bone cancer Sister 49   Breast cancer Neg Hx    Cervical cancer Neg Hx     Social History:  reports that she quit smoking about 2 years ago. Her smoking use included cigarettes. She started smoking about 46 years ago. She has a 11 pack-year smoking history. She has never used smokeless tobacco. She reports that she does not currently use alcohol after a past usage of about 2.0  standard drinks of alcohol per week. She reports that she does not currently use drugs after having used the following drugs: Cocaine.  Allergies:  Allergies  Allergen Reactions   Shrimp [Shellfish Allergy] Shortness Of Breath   Bactroban [Mupirocin] Other (See Comments)    "Sores in nose"   Hydrocodone Itching, Nausea Only and Nausea And Vomiting   Chlorhexidine Gluconate Itching   Tylenol [Acetaminophen] Itching   Zestril [Lisinopril] Cough    Medications: I have reviewed the patient's current medications.   Results for orders placed or performed during the hospital encounter of 01/14/23 (from the past 48 hour(s))  Basic metabolic panel     Status: Abnormal   Collection Time: 01/14/23  3:45 PM  Result Value Ref Range   Sodium 138 135 - 145 mmol/L   Potassium 4.4 3.5 - 5.1 mmol/L   Chloride 97 (L) 98 - 111 mmol/L   CO2 24 22 - 32 mmol/L   Glucose, Bld 99 70 - 99 mg/dL     Comment: Glucose reference range applies only to samples taken after fasting for at least 8 hours.   BUN 42 (H) 8 - 23 mg/dL   Creatinine, Ser 8.41 (H) 0.44 - 1.00 mg/dL   Calcium 9.4 8.9 - 32.4 mg/dL   GFR, Estimated 4 (L) >60 mL/min    Comment: (NOTE) Calculated using the CKD-EPI Creatinine Equation (2021)    Anion gap 17 (H) 5 - 15    Comment: Performed at [facility], [address] [place], [place] [address]  CBC     Status: Abnormal   Collection Time: 01/14/23  3:45 PM  Result Value Ref Range   WBC 7.2 4.0 - 10.5 K/uL   RBC 3.32 (L) 3.87 - 5.11 MIL/uL   Hemoglobin 9.6 (L) 12.0 - 15.0 g/dL   HCT 72.5 (L) 36.6 - 44.0 %   MCV 93.4 80.0 - 100.0 fL   MCH 28.9 26.0 - 34.0 pg   MCHC 31.0 30.0 - 36.0 g/dL   RDW 34.7 (H) 42.5 - 95.6 %   Platelets 195 150 - 400 K/uL   nRBC 0.0 0.0 - 0.2 %    Comment: Performed at [facility], [address], [place], [place] [address]  Troponin I (High Sensitivity)     Status: Abnormal   Collection Time: 01/14/23  3:45 PM  Result Value Ref Range   Troponin I (High Sensitivity) 66 (H) <18 ng/L    Comment: (NOTE) Elevated high sensitivity troponin I (hsTnI) values and significant  changes across serial measurements may suggest ACS but many other  chronic and acute conditions are known to elevate hsTnI results.  Refer to the "Links" section for chest pain algorithms and additional  guidance. Performed at [facility], [address], [place], [place] [address]   Brain natriuretic peptide     Status: Abnormal   Collection Time: 01/14/23  3:45 PM  Result Value Ref Range   B Natriuretic Peptide >4,500.0 (H) 0.0 - 100.0 pg/mL    Comment: Performed at [facility], [address] [place], [place] [address]  I-Stat venous blood gas, [facility], [facility], [facility])     Status: Abnormal   Collection Time: 01/14/23  3:53 PM  Result Value Ref Range   pH, Ven 7.530 (H) 7.25 - 7.43   pCO2, Ven 34.5 (L) 44 - 60 mmHg   pO2, Ven 120 (H) 32 - 45  mmHg   Bicarbonate 28.8 (H) 20.0 -  28.0 mmol/L   TCO2 30 22 - 32 mmol/L   O2 Saturation 99 %   Acid-Base Excess 6.0 (H) 0.0 - 2.0 mmol/L   Sodium 139 135 - 145 mmol/L   Potassium 4.4 3.5 - 5.1 mmol/L   Calcium, Ion 1.06 (L) 1.15 - 1.40 mmol/L   HCT 32.0 (L) 36.0 - 46.0 %   Hemoglobin 10.9 (L) 12.0 - 15.0 g/dL   Sample type VENOUS    *Note: Due to a large number of results and/or encounters for the requested time period, some results have not been displayed. A complete set of results can be found in Results Review.    No results found.  ROS: As per H&P, rest of the review of system reviewed and negative. Blood pressure (!) 229/122, pulse 93, temperature 98.4 F (36.9 C), temperature source Oral, resp. rate (!) 22, SpO2 97%. Gen: On BiPAP, mild respiratory distress however able to speak sentences. Respiratory: Coarse breath sound bilaterally.  On BiPAP Cardiovascular: Regular rate rhythm S1-S2 normal, no rubs GI: Abdomen soft, nontender, nondistended Extremities, no cyanosis or clubbing, bilateral peripheral edema present  skin: No rash or ulcer Neurology: Alert, awake, following commands, oriented Dialysis Access: TDC, site clean  Assessment/Plan:  # Hypertensive emergency/fluid overload: Agree with nitro drip.  We will plan to do dialysis tonight, UF as tolerated.    # Shortness of breath: Pending chest x-ray and CT angio.  Hopefully ultrafiltration will help breathing.  # ESRD: Nonadherence with outpatient dialysis often.  TTS schedule.  We will plan to do HD tonight using TDC.  No heparin.  #  Anemia of ESRD: Hemoglobin at goal.  She just received Mircera on 8/13.  # Metabolic Bone Disease: Resume Hectorol.  Monitor calcium, phosphorus.  Thank you for the consult.  We will continue to follow.   Jaynie Collins 01/14/2023, 4:46 PM

## 2023-01-14 NOTE — ED Triage Notes (Signed)
Last night started having shortness of breath, accompanied with chest pain and reported slurred speech. Supposed to have dialysis today but did not go because she didn't feel well. Diminished on the right side per medic, and was 85%ra with fire.   Medic vitals  220/130 90hr 40rr 113bgl

## 2023-01-14 NOTE — ED Provider Notes (Signed)
  Physical Exam  BP 118/89   Pulse (!) 157   Temp 98.4 F (36.9 C) (Oral)   Resp (!) 32   SpO2 100%   Physical Exam Constitutional:      General: She is in acute distress.     Appearance: She is ill-appearing. She is not toxic-appearing.  HENT:     Head: Normocephalic and atraumatic.  Cardiovascular:     Rate and Rhythm: Regular rhythm. Tachycardia present.  Pulmonary:     Effort: Tachypnea and accessory muscle usage present.     Breath sounds: Rales present.  Musculoskeletal:     Right lower leg: Edema present.     Left lower leg: Edema present.  Skin:    General: Skin is warm and dry.     Capillary Refill: Capillary refill takes less than 2 seconds.  Neurological:     Mental Status: She is alert and oriented to person, place, and time.  Psychiatric:        Mood and Affect: Mood normal.        Behavior: Behavior normal.     Procedures  .Critical Care  Performed by: Lonell Grandchild, MD Authorized by: Lonell Grandchild, MD   Critical care provider statement:    Critical care time (minutes):  30   Critical care was necessary to treat or prevent imminent or life-threatening deterioration of the following conditions:  Renal failure and respiratory failure   Critical care was time spent personally by me on the following activities:  Development of treatment plan with patient or surrogate, discussions with consultants, evaluation of patient's response to treatment, examination of patient, ordering and review of laboratory studies, ordering and review of radiographic studies, ordering and performing treatments and interventions, pulse oximetry, re-evaluation of patient's condition and review of old charts   ED Course / MDM   Clinical Course as of 01/14/23 1936  Thu Jan 14, 2023  1641 Dr Elon Spanner [RP]  1700 Received sign out from Dr. Eloise Harman pending CTA C/A/P, prior dissection repair, appears volume overloaded and with HTN needing dialysis.  [WS]  1927 Discussed with  radiologist, agree that no dissection present and no large PE.  Patient does have other findings and will require more comprehensive review for full report.  Patient developed atrial arrhythmia, probably atrial flutter, blood pressure dropped but not hypotensive.  Nitroglycerin discontinued.  Discussed with ICU who will see the patient.  May need hemodialysis in ICU given patient on BiPAP, now with atrial arrhythmia and tachycardia. [WS]  1930 ICU will admit patient given more complicated presentation than simple HD need.  [WS]    Clinical Course User Index [RP] Rondel Baton, MD [WS] Lonell Grandchild, MD   Medical Decision Making Amount and/or Complexity of Data Reviewed Labs: ordered. Radiology: ordered.  Risk Prescription drug management. Decision regarding hospitalization.          Lonell Grandchild, MD 01/14/23 [phone]

## 2023-01-14 NOTE — ED Provider Notes (Signed)
[place] EMERGENCY DEPARTMENT AT [facility] Provider Note   CSN: [id number] Arrival date & time: 01/14/23  1510     History  Chief Complaint  Patient presents with   Shortness of Breath    Monique Henderson is a 72 y.o. female.  72 year old female with a history of atrial fibrillation on Eliquis, ESRD on Tuesday Thursday Saturday dialysis, COPD not on home oxygen, hypertension, and type aortic aneurysm status postrepair who presents to the emergency department with shortness of breath.  Reports that last night she started feeling short of breath.  Has also had a productive cough and runny nose.  No fevers.  Also has been having some sharp substernal chest pain that is positional but not pleuritic or exertional.  No nausea or vomiting or diaphoresis.  Says that she has been compliant with her dialysis and last had dialysis 2 days ago.       Home Medications Prior to Admission medications   Medication Sig Start Date End Date Taking? Authorizing Provider  albuterol (VENTOLIN HFA) 108 (90 Base) MCG/ACT inhaler Inhale 2 puffs into the lungs every 6 (six) hours as needed for wheezing or shortness of breath. 07/30/20   Janeece Agee, NP  amLODipine (NORVASC) 10 MG tablet Take 1 tablet (10 mg total) by mouth daily. 03/06/22   Setzer, Lynnell Jude, PA-C  apixaban (ELIQUIS) 2.5 MG TABS tablet Take 1 tablet (2.5 mg total) by mouth 2 (two) times daily. 03/06/22   Setzer, Lynnell Jude, PA-C  carvedilol (COREG) 25 MG tablet Take 1 tablet (25 mg total) by mouth 2 (two) times daily with a meal. 05/01/22 05/02/23  Ghimire, Werner Lean, MD  Cholecalciferol (VITAMIN D-3 PO) Take 1 capsule by mouth daily.    [provider]  cloNIDine (CATAPRES) 0.1 MG tablet Take 1 tablet (0.1 mg total) by mouth 2 (two) times daily. 12/18/22   Corky Crafts, MD  hydrALAZINE (APRESOLINE) 100 MG tablet Take 1 tablet (100 mg total) by mouth every 8 (eight) hours. 12/18/22   Corky Crafts, MD   isosorbide mononitrate (IMDUR) 30 MG 24 hr tablet TAKE 1 TABLET (30MG ) ON DIALYSIS DAYS AND 2 TABLETS (60 MG) ON NON DIALYSIS DAYS. 01/12/23   Corky Crafts, MD  naloxone [facility]) nasal spray 4 mg/0.1 mL 1 spray as directed. 02/19/22   [provider]  oxyCODONE-acetaminophen (PERCOCET) 10-325 MG tablet Take 1 tablet by mouth 3 (three) times daily as needed for pain. 03/12/22   [provider]  pantoprazole (PROTONIX) 40 MG tablet TAKE 1 TABLET (40 MG TOTAL) BY MOUTH 2 (TWO) TIMES DAILY. 10/08/22   Georganna Skeans, MD  rosuvastatin (CRESTOR) 10 MG tablet TAKE 1 TABLET (10 MG TOTAL) BY MOUTH DAILY FOR CHOLESTEROL Patient taking differently: Take 10 mg by mouth daily. 08/07/22   Georganna Skeans, MD  senna-docusate (SENOKOT-S) 8.6-50 MG tablet Take 1 tablet by mouth 2 (two) times daily between meals as needed for mild constipation. 08/14/22   Almon Hercules, MD  sevelamer carbonate (RENVELA) 2.4 g PACK Take 2.4 g by mouth 3 (three) times daily with meals. 03/06/22   Setzer, Lynnell Jude, PA-C  traZODone (DESYREL) 50 MG tablet Take 0.5 tablets (25 mg total) by mouth at bedtime as needed for sleep. 04/20/22   Georganna Skeans, MD      Allergies    Shrimp [shellfish allergy], Bactroban [mupirocin], Hydrocodone, Chlorhexidine gluconate, Tylenol [acetaminophen], and Zestril [lisinopril]    Review of Systems   Review of Systems  Physical  Exam Updated Vital Signs BP (!) 171/96   Pulse 75   Temp 98.3 F (36.8 C) (Axillary)   Resp (!) 23   Wt 49.1 kg   SpO2 95%   BMI 19.17 kg/m  Physical Exam Vitals and nursing note reviewed.  Constitutional:      General: She is not in acute distress.    Appearance: She is well-developed. She is ill-appearing.  HENT:     Head: Normocephalic and atraumatic.     Right Ear: External ear normal.     Left Ear: External ear normal.     Nose: Nose normal.  Eyes:     Extraocular Movements: Extraocular movements intact.     Conjunctiva/sclera:  Conjunctivae normal.     Pupils: Pupils are equal, round, and reactive to light.  Cardiovascular:     Rate and Rhythm: Normal rate and regular rhythm.     Heart sounds: No murmur heard. Pulmonary:     Effort: Pulmonary effort is normal. No respiratory distress.     Comments: On 2 L nasal cannula diminished bilaterally Musculoskeletal:     Cervical back: Normal range of motion and neck supple.     Right lower leg: Edema (1+) present.     Left lower leg: Edema (1+) present.  Skin:    General: Skin is warm and dry.  Neurological:     Mental Status: She is alert and oriented to person, place, and time. Mental status is at baseline.  Psychiatric:        Mood and Affect: Mood normal.     ED Results / Procedures / Treatments   Labs (all labs ordered are listed, but only abnormal results are displayed) Labs Reviewed  BASIC METABOLIC PANEL - Abnormal; Notable for the following components:      Result Value   Chloride 97 (*)    BUN 42 (*)    Creatinine, Ser 8.91 (*)    GFR, Estimated 4 (*)    Anion gap 17 (*)    All other components within normal limits  CBC - Abnormal; Notable for the following components:   RBC 3.32 (*)    Hemoglobin 9.6 (*)    HCT 31.0 (*)    RDW 17.2 (*)    All other components within normal limits  BRAIN NATRIURETIC PEPTIDE - Abnormal; Notable for the following components:   B Natriuretic Peptide >4,500.0 (*)    All other components within normal limits  RENAL FUNCTION PANEL - Abnormal; Notable for the following components:   Chloride 97 (*)    Glucose, Bld 100 (*)    BUN 46 (*)    Creatinine, Ser 9.22 (*)    Phosphorus 6.3 (*)    Albumin 3.3 (*)    GFR, Estimated 4 (*)    All other components within normal limits  CBC - Abnormal; Notable for the following components:   RBC 2.88 (*)    Hemoglobin 8.4 (*)    HCT 26.7 (*)    RDW 17.2 (*)    All other components within normal limits  CBC - Abnormal; Notable for the following components:   RBC 3.29  (*)    Hemoglobin 9.3 (*)    HCT 29.6 (*)    RDW 17.1 (*)    All other components within normal limits  BASIC METABOLIC PANEL - Abnormal; Notable for the following components:   Sodium 134 (*)    Potassium 2.1 (*)    Chloride 89 (*)    BUN <5 (*)  Creatinine, Ser 1.10 (*)    GFR, Estimated 54 (*)    All other components within normal limits  MAGNESIUM - Abnormal; Notable for the following components:   Magnesium 1.5 (*)    All other components within normal limits  PHOSPHORUS - Abnormal; Notable for the following components:   Phosphorus <1.0 (*)    All other components within normal limits  I-STAT VENOUS BLOOD GAS, ED - Abnormal; Notable for the following components:   pH, Ven 7.530 (*)    pCO2, Ven 34.5 (*)    pO2, Ven 120 (*)    Bicarbonate 28.8 (*)    Acid-Base Excess 6.0 (*)    Calcium, Ion 1.06 (*)    HCT 32.0 (*)    Hemoglobin 10.9 (*)    All other components within normal limits  TROPONIN I (HIGH SENSITIVITY) - Abnormal; Notable for the following components:   Troponin I (High Sensitivity) 66 (*)    All other components within normal limits  TROPONIN I (HIGH SENSITIVITY) - Abnormal; Notable for the following components:   Troponin I (High Sensitivity) 62 (*)    All other components within normal limits  SARS CORONAVIRUS 2 BY RT PCR  MRSA NEXT GEN BY PCR, NASAL  HEPATITIS B SURFACE ANTIGEN  HEPATITIS B SURFACE ANTIBODY, QUANTITATIVE  HIV ANTIBODY (ROUTINE TESTING W REFLEX)  GLUCOSE, CAPILLARY  BASIC METABOLIC PANEL  MAGNESIUM  PHOSPHORUS    EKG EKG Interpretation Date/Time:  Thursday January 14 2023 19:16:27 EDT Ventricular Rate:  157 PR Interval:  159 QRS Duration:  127 QT Interval:  333 QTC Calculation: 539 R Axis:   19  Text Interpretation: atrial fib or flutter Right bundle branch block Confirmed by Alvino Blood ([id number]) on 01/14/2023 7:20:27 PM  Radiology CT ANGIO CHEST/ABD/PEL FOR DISSECTION W &/OR WO CONTRAST  Result Date:  01/14/2023 CLINICAL DATA:  Acute aortic syndrome suspected chest pain EXAM: CT ANGIOGRAPHY CHEST, ABDOMEN AND PELVIS TECHNIQUE: Non-contrast CT of the chest was initially obtained. Multidetector CT imaging through the chest, abdomen and pelvis was performed using the standard protocol during bolus administration of intravenous contrast. Multiplanar reconstructed images and MIPs were obtained and reviewed to evaluate the vascular anatomy. RADIATION DOSE REDUCTION: This exam was performed according to the departmental dose-optimization program which includes automated exposure control, adjustment of the mA and/or kV according to patient size and/or use of iterative reconstruction technique. CONTRAST:  75mL OMNIPAQUE IOHEXOL 350 MG/ML SOLN COMPARISON:  01/29/2022 FINDINGS: CTA CHEST FINDINGS VASCULAR Aorta: Satisfactory opacification of the aorta. 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 No evidence of aneurysm, dissection, or other acute aortic pathology. Severe mixed aortic atherosclerosis. Large-bore right neck multi lumen vascular catheter Cardiovascular: No evidence of pulmonary embolism on limited non-tailored examination. Cardiomegaly. No pericardial effusion. Review of the MIP images confirms the above findings. NON VASCULAR Mediastinum/Nodes: No enlarged mediastinal, hilar, or axillary lymph nodes. Thyroid gland, trachea, and esophagus demonstrate no significant findings. Lungs/Pleura: Moderate right, small left pleural effusions and associated atelectasis or consolidation. Musculoskeletal: No chest wall abnormality. No acute osseous findings. Review of the MIP images confirms the above findings. CTA ABDOMEN AND PELVIS FINDINGS VASCULAR Normal contour and caliber of the abdominal aorta. No evidence of aneurysm, dissection, or other acute aortic pathology. Standard branching pattern of the abdominal aorta with solitary bilateral renal  arteries. Severe mixed aortic atherosclerosis. Review of the MIP images confirms the above findings. NON-VASCULAR Hepatobiliary: No solid liver abnormality is seen.  No gallstones, gallbladder wall thickening, or biliary dilatation. Pancreas: Unremarkable. No pancreatic ductal dilatation or surrounding inflammatory changes. Spleen: Normal in size without significant abnormality. Adrenals/Urinary Tract: Adrenal glands are unremarkable. Atrophic kidneys. Small benign, intrinsically hyperdense lesion of the posterior superior pole of the left kidney unchanged, consistent with a small hemorrhagic or proteinaceous cyst, requiring no specific further follow-up or characterization (series 5, image 159). No calculi or hydronephrosis. Bladder is unremarkable. Stomach/Bowel: Stomach is within normal limits. Appendix appears normal. No evidence of bowel wall thickening, distention, or inflammatory changes. Sigmoid diverticulosis. Lymphatic: No enlarged abdominal or pelvic lymph nodes. Reproductive: Calcified uterine fibroids. Other: No abdominal wall hernia or abnormality. Small volume ascites throughout the abdomen and pelvis. Musculoskeletal: No acute osseous findings. Renal osteodystrophy. Unchanged fatty herniations of the left flank (series 5, image 176). IMPRESSION: 1. 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 No evidence of aneurysm, dissection, or other acute aortic pathology. 2. Severe mixed aortic atherosclerosis. 3. Moderate right, small left pleural effusions and associated atelectasis or consolidation. 4. Small volume ascites throughout the abdomen and pelvis. 5. Sigmoid diverticulosis without evidence of acute diverticulitis. Aortic Atherosclerosis (ICD10-I70.0). Electronically Signed   By: Jearld Lesch M.D.   On: 01/14/2023 19:32   DG Chest Portable 1 View  Result Date: 01/14/2023 CLINICAL DATA:  Shortness of breath EXAM: PORTABLE CHEST 1  VIEW COMPARISON:  09/25/2022 FINDINGS: Post sternotomy changes. Cardiomegaly with probable small pleural effusions. Vascular congestion and diffuse interstitial and ground-glass opacity consistent with pulmonary edema. Right-sided central venous catheter tip over the right atrium. Aortic atherosclerosis IMPRESSION: Cardiomegaly with vascular congestion and pulmonary edema. Probable small pleural effusions. Electronically Signed   By: Jasmine Pang M.D.   On: 01/14/2023 17:22    Procedures Procedures    Medications Ordered in ED Medications  nitroGLYCERIN 50 mg in dextrose 5 % 250 mL (0.2 mg/mL) infusion (130 mcg/min Intravenous Infusion Verify 01/15/23 0500)  pentafluoroprop-tetrafluoroeth (GEBAUERS) aerosol 1 Application (has no administration in time range)  lidocaine (PF) (XYLOCAINE) 1 % injection 5 mL (has no administration in time range)  lidocaine-prilocaine (EMLA) cream 1 Application (has no administration in time range)  heparin injection 1,000 Units (1,000 Units Intracatheter Incomplete 01/15/23 0305)  anticoagulant sodium citrate solution 5 mL (has no administration in time range)  alteplase (CATHFLO ACTIVASE) injection 2 mg (has no administration in time range)  docusate sodium (COLACE) capsule 100 mg (has no administration in time range)  polyethylene glycol (MIRALAX / GLYCOLAX) packet 17 g (has no administration in time range)  metoprolol tartrate (LOPRESSOR) injection 2.5-5 mg (2.5 mg Intravenous Given 01/14/23 2255)  amLODipine (NORVASC) tablet 10 mg (has no administration in time range)  apixaban (ELIQUIS) tablet 2.5 mg (has no administration in time range)  cloNIDine (CATAPRES) tablet 0.1 mg (has no administration in time range)  carvedilol (COREG) tablet 25 mg (has no administration in time range)  hydrALAZINE (APRESOLINE) tablet 100 mg (100 mg Oral Given 01/15/23 0534)  pantoprazole (PROTONIX) EC tablet 40 mg (has no administration in time range)  rosuvastatin (CRESTOR) tablet  10 mg (has no administration in time range)  albuterol (PROVENTIL) (2.5 MG/3ML) 0.083% nebulizer solution 2.5 mg (has no administration in time range)  hydrALAZINE (APRESOLINE) injection 5-10 mg (5 mg Intravenous Given 01/15/23 0051)  ipratropium-albuterol (DUONEB) 0.5-2.5 (3) MG/3ML nebulizer solution 3 mL (3 mLs Nebulization Given 01/14/23 1603)  hydrALAZINE (APRESOLINE) injection 10 mg (10 mg Intravenous Given 01/14/23 1606)  fentaNYL (SUBLIMAZE)  injection 50 mcg (50 mcg Intravenous Given 01/14/23 1657)  iohexol (OMNIPAQUE) 350 MG/ML injection 75 mL (75 mLs Intravenous Contrast Given 01/14/23 1808)  labetalol (NORMODYNE) injection 20 mg (20 mg Intravenous Given 01/15/23 0517)  acetaminophen (OFIRMEV) IV 1,000 mg (1,000 mg Intravenous New Bag/Given 01/15/23 0522)    ED Course/ Medical Decision Making/ A&P Clinical Course as of 01/15/23 0618  Thu Jan 14, 2023  1641 Dr Elon Spanner from nephrology consulted. [RP]  1700 Received sign out from Dr. Eloise Harman pending CTA C/A/P, prior dissection repair, appears volume overloaded and with HTN needing dialysis.  [WS]  1927 Discussed with radiologist, agree that no dissection present and no large PE.  Patient does have other findings and will require more comprehensive review for full report.  Patient developed atrial arrhythmia, probably atrial flutter, blood pressure dropped but not hypotensive.  Nitroglycerin discontinued.  Discussed with ICU who will see the patient.  May need hemodialysis in ICU given patient on BiPAP, now with atrial arrhythmia and tachycardia. [WS]  1930 ICU will admit patient given more complicated presentation than simple HD need.  [WS]    Clinical Course User Index [RP] Rondel Baton, MD [WS] Lonell Grandchild, MD                                 Medical Decision Making Amount and/or Complexity of Data Reviewed Labs: ordered. Radiology: ordered.  Risk Prescription drug management. Decision regarding  hospitalization.   Monique Henderson is a 72 y.o. female with comorbidities that complicate the patient evaluation including atrial fibrillation on Eliquis, ESRD on Tuesday Thursday Saturday dialysis, COPD not on home oxygen, hypertension, and type aortic dissection status postrepair who presents to the emergency department with shortness of breath, chest pain, and hypoxia with severely elevated blood pressure  Initial Ddx:  Hypertensive emergency, flash pulmonary edema, MI, PE, aortic dissection, pneumonia, URI, COPD exacerbation  MDM/Course:  Patient presented to the emergency department with shortness of breath and chest pain in the setting of severely elevated blood pressures.  Initially was worried about hypertensive emergency and patient was given IV hydralazine.  She did have increased work of breathing and initially did not want BiPAP but due to her persistent shortness of breath was eventually started on BiPAP and nitroglycerin drip.  Does have a history of aortic repair and so CTA will be obtained to ensure that she is not having a dissection.  Initial EKG and troponin appear similar to priors.  Upon re-evaluation patient remained in critical condition.  Signed out to the oncoming physician awaiting CTA.  Did also reach out to nephrology about dialysis and they will coordinate for her to get an emergent session today.  This patient presents to the ED for concern of complaints listed in HPI, this involves an extensive number of treatment options, and is a complaint that carries with it a high risk of complications and morbidity. Disposition including potential need for admission considered.   Dispo: Pending remainder of workup  Additional history obtained from EMS Records reviewed Outpatient Clinic Notes and ED Visit Notes The following labs were independently interpreted: Chemistry and show CKD I independently reviewed the following imaging with scope of interpretation limited to  determining acute life threatening conditions related to emergency care: Chest x-ray and agree with the radiologist interpretation with the following exceptions: none I personally reviewed and interpreted cardiac monitoring: normal sinus rhythm  I personally reviewed and interpreted  the pt's EKG: see above for interpretation  I have reviewed the patients home medications and made adjustments as needed Consults: Nephrology Social Determinants of health:  Elderly  CRITICAL CARE Performed by: Rondel Baton   Total critical care time: 60 minutes  Critical care time was exclusive of separately billable procedures and treating other patients.  Critical care was necessary to treat or prevent imminent or life-threatening deterioration.  Critical care was time spent personally by me on the following activities: development of treatment plan with patient and/or surrogate as well as nursing, discussions with consultants, evaluation of patient's response to treatment, examination of patient, obtaining history from patient or surrogate, ordering and performing treatments and interventions, ordering and review of laboratory studies, ordering and review of radiographic studies, pulse oximetry and re-evaluation of patient's condition.   Final Clinical Impression(s) / ED Diagnoses Final diagnoses:  Hypertensive emergency  ESRD (end stage renal disease) (HCC)  Hypervolemia, unspecified hypervolemia type  Acute respiratory failure with hypoxia (HCC)  Chest pain, unspecified type    Rx / DC Orders ED Discharge Orders     None         Rondel Baton, MD 01/15/23 [phone]

## 2023-01-14 NOTE — Progress Notes (Signed)
Pt transported from ED to 3M07 on BiPAP w/settings as per flowsheet. Pt tol well.

## 2023-01-14 NOTE — Progress Notes (Signed)
eLink Physician-Brief Progress Note Patient Name: Monique Henderson DOB: 1951-04-16 MRN: [id number]   Date of Service  01/14/2023  HPI/Events of Note  Patient admitted with accelerated hypertension,  pulmonary edema, acute hypoxemic respiratory failure, and atrial fibrillation with RVR, against the background of ESRD-Dialysis Dependent.  eICU Interventions  New Patient Evaluation.        Migdalia Dk 01/14/2023, 9:17 PM

## 2023-01-14 NOTE — Progress Notes (Signed)
Attempted to call patient's significant other, Mr. Tresa Garter, at [phone].

## 2023-01-14 NOTE — Progress Notes (Signed)
eLink Physician-Brief Progress Note Patient Name: Monique Henderson DOB: [birth year] MRN: [id number]   Date of Service  01/14/2023  HPI/Events of Note  BP 210/102, HR 65  eICU Interventions  Hydralazine 5-10 mg IV Q 4 hours PRN SBP > 180 mmHg ordered.        Migdalia Dk 01/14/2023, 11:58 PM

## 2023-01-15 ENCOUNTER — Ambulatory Visit: Payer: Self-pay

## 2023-01-15 DIAGNOSIS — I161 Hypertensive emergency: Secondary | ICD-10-CM

## 2023-01-15 DIAGNOSIS — I4891 Unspecified atrial fibrillation: Secondary | ICD-10-CM

## 2023-01-15 DIAGNOSIS — I5043 Acute on chronic combined systolic (congestive) and diastolic (congestive) heart failure: Secondary | ICD-10-CM

## 2023-01-15 DIAGNOSIS — N186 End stage renal disease: Secondary | ICD-10-CM

## 2023-01-15 DIAGNOSIS — Z7189 Other specified counseling: Secondary | ICD-10-CM

## 2023-01-15 DIAGNOSIS — J9601 Acute respiratory failure with hypoxia: Secondary | ICD-10-CM

## 2023-01-15 LAB — CBC
HCT: 29.6 % — ABNORMAL LOW (ref 36.0–46.0)
Hemoglobin: 9.3 g/dL — ABNORMAL LOW (ref 12.0–15.0)
MCH: 28.3 pg (ref 26.0–34.0)
MCHC: 31.4 g/dL (ref 30.0–36.0)
MCV: 90 fL (ref 80.0–100.0)
Platelets: 186 10*3/uL (ref 150–400)
RBC: 3.29 MIL/uL — ABNORMAL LOW (ref 3.87–5.11)
RDW: 17.1 % — ABNORMAL HIGH (ref 11.5–15.5)
WBC: 5.5 10*3/uL (ref 4.0–10.5)
nRBC: 0 % (ref 0.0–0.2)

## 2023-01-15 LAB — BASIC METABOLIC PANEL
Anion gap: 15 (ref 5–15)
Anion gap: 12 (ref 5–15)
BUN: <5 mg/dL — ABNORMAL LOW (ref 8–23)
BUN: 17 mg/dL (ref 8–23)
CO2: 30 mmol/L (ref 22–32)
CO2: 29 mmol/L (ref 22–32)
Calcium: 9.3 mg/dL (ref 8.9–10.3)
Calcium: 8.8 mg/dL — ABNORMAL LOW (ref 8.9–10.3)
Chloride: 89 mmol/L — ABNORMAL LOW (ref 98–111)
Chloride: 93 mmol/L — ABNORMAL LOW (ref 98–111)
Creatinine, Ser: 1.1 mg/dL — ABNORMAL HIGH (ref 0.44–1.00)
Creatinine, Ser: 4.33 mg/dL — ABNORMAL HIGH (ref 0.44–1.00)
GFR, Estimated: 54 mL/min — ABNORMAL LOW (ref >60)
GFR, Estimated: 10 mL/min — ABNORMAL LOW (ref >60)
Glucose, Bld: 89 mg/dL (ref 70–99)
Glucose, Bld: 98 mg/dL (ref 70–99)
Potassium: 2.1 mmol/L — CL (ref 3.5–5.1)
Potassium: 3.4 mmol/L — ABNORMAL LOW (ref 3.5–5.1)
Sodium: 134 mmol/L — ABNORMAL LOW (ref 135–145)
Sodium: 134 mmol/L — ABNORMAL LOW (ref 135–145)

## 2023-01-15 LAB — MAGNESIUM
Magnesium: 1.5 mg/dL — ABNORMAL LOW (ref 1.7–2.4)
Magnesium: 1.5 mg/dL — ABNORMAL LOW (ref 1.7–2.4)

## 2023-01-15 LAB — PHOSPHORUS
Phosphorus: <1 mg/dL — CL (ref 2.5–4.6)
Phosphorus: 3.1 mg/dL (ref 2.5–4.6)

## 2023-01-15 LAB — HEPATITIS B SURFACE ANTIBODY, QUANTITATIVE: Hep B S AB Quant (Post): 51.8 m[IU]/mL

## 2023-01-15 MED ORDER — LABETALOL HCL 5 MG/ML IV SOLN
20.0000 mg | Freq: Once | INTRAVENOUS | Status: AC
  Administered 2023-01-15: 20 mg via INTRAVENOUS
  Filled 2023-01-15: qty 4

## 2023-01-15 MED ORDER — POTASSIUM CHLORIDE CRYS ER 20 MEQ PO TBCR
20.0000 meq | EXTENDED_RELEASE_TABLET | Freq: Once | ORAL | Status: AC
  Administered 2023-01-15: 20 meq via ORAL
  Filled 2023-01-15: qty 1

## 2023-01-15 MED ORDER — ACETAMINOPHEN 10 MG/ML IV SOLN
1000.0000 mg | Freq: Once | INTRAVENOUS | Status: AC
  Administered 2023-01-15: 1000 mg via INTRAVENOUS
  Filled 2023-01-15: qty 100

## 2023-01-15 MED ORDER — ISOSORBIDE MONONITRATE ER 30 MG PO TB24
30.0000 mg | ORAL_TABLET | Freq: Every day | ORAL | Status: DC
  Administered 2023-01-15 – 2023-01-16 (×2): 30 mg via ORAL
  Filled 2023-01-15 (×3): qty 1

## 2023-01-15 MED ORDER — LEVALBUTEROL HCL 0.63 MG/3ML IN NEBU: 0.6300 mg | INHALATION_SOLUTION | Freq: Four times a day (QID) | RESPIRATORY_TRACT | Status: DC | PRN

## 2023-01-15 MED ORDER — LABETALOL HCL 5 MG/ML IV SOLN
INTRAVENOUS | Status: AC
  Filled 2023-01-15: qty 4

## 2023-01-15 MED ORDER — LABETALOL HCL 5 MG/ML IV SOLN
10.0000 mg | INTRAVENOUS | Status: DC | PRN
  Administered 2023-01-15 – 2023-01-16 (×3): 10 mg via INTRAVENOUS
  Filled 2023-01-15 (×3): qty 4

## 2023-01-15 MED ORDER — SODIUM CHLORIDE 0.9 % IV SOLN
200.0000 mg | Freq: Once | INTRAVENOUS | Status: DC
  Filled 2023-01-15: qty 2

## 2023-01-15 MED ORDER — MAGNESIUM SULFATE 2 GM/50ML IV SOLN
2.0000 g | Freq: Once | INTRAVENOUS | Status: AC
  Administered 2023-01-15: 2 g via INTRAVENOUS
  Filled 2023-01-15: qty 50

## 2023-01-15 NOTE — Progress Notes (Signed)
Nephrology Follow-Up Consult note   Assessment/Recommendations: Monique Henderson is a/an 72 y.o. female with a past medical history significant for ESRD, admitted for htn.      # Hypertensive emergency/fluid overload: Plan for dialysis again this evening for volume removal.  Continue oral medications and titrate down IV nitroglycerin as able   # Shortness of breath: Seems improved.  Continue with hypertension management and volume removal with hemodialysis   # ESRD: Nonadherence with outpatient dialysis often.  TTS schedule.  Received Allises Thursday night.  Plan for dialysis later today.  Follow response and consider further dialysis tomorrow   #  Anemia of ESRD: Hemoglobin at goal.  She just received Mircera on 8/13.   # Metabolic Bone Disease: Resume Hectorol.  Monitor calcium, phosphorus.   Recommendations conveyed to primary service.    Darnell [facility] 01/15/2023 11:21 AM  ___________________________________________________________  CC: Hypertension  Interval History/Subjective: Patient states she feels well today.  Remains on nitroglycerin infusion.  Tolerated dialysis with 2.5 L removed.  Remains hypertensive.   Medications:  Current Facility-Administered Medications  Medication Dose Route Frequency Provider Last Rate Last Admin   alteplase (CATHFLO ACTIVASE) injection 2 mg  2 mg Intracatheter Once PRN Maxie Barb, MD       amLODipine (NORVASC) tablet 10 mg  10 mg Oral Daily Duayne Cal, NP   10 mg at 01/15/23 1610   anticoagulant sodium citrate solution 5 mL  5 mL Intracatheter PRN Maxie Barb, MD       apixaban Everlene Balls) tablet 2.5 mg  2.5 mg Oral BID Duayne Cal, NP   2.5 mg at 01/15/23 0908   carvedilol (COREG) tablet 25 mg  25 mg Oral BID WC Duayne Cal, NP   25 mg at 01/15/23 0856   cloNIDine (CATAPRES) tablet 0.1 mg  0.1 mg Oral BID Duayne Cal, NP   0.1 mg at 01/15/23 0908   docusate sodium  (COLACE) capsule 100 mg  100 mg Oral BID PRN Duayne Cal, NP       heparin injection 1,000 Units  1,000 Units Intracatheter PRN Maxie Barb, MD       hydrALAZINE (APRESOLINE) tablet 100 mg  100 mg Oral Q8H Duayne Cal, NP   100 mg at 01/15/23 0534   labetalol (NORMODYNE) injection 10 mg  10 mg Intravenous Q2H PRN Gonfa, Boyce Medici, MD       levalbuterol (XOPENEX) nebulizer solution 0.63 mg  0.63 mg Nebulization Q6H PRN Gonfa, Taye T, MD       lidocaine (PF) (XYLOCAINE) 1 % injection 5 mL  5 mL Intradermal PRN Maxie Barb, MD       lidocaine-prilocaine (EMLA) cream 1 Application  1 Application Topical PRN Maxie Barb, MD       metoprolol tartrate (LOPRESSOR) injection 2.5-5 mg  2.5-5 mg Intravenous Q3H PRN Duayne Cal, NP   2.5 mg at 01/14/23 2255   nitroGLYCERIN 50 mg in dextrose 5 % 250 mL (0.2 mg/mL) infusion  0-200 mcg/min Intravenous Continuous Rondel Baton, MD 50 mL/hr at 01/15/23 0857 166.667 mcg/min at 01/15/23 0857   pantoprazole (PROTONIX) EC tablet 40 mg  40 mg Oral BID Duayne Cal, NP   40 mg at 01/15/23 0908   pentafluoroprop-tetrafluoroeth (GEBAUERS) aerosol 1 Application  1 Application Topical PRN Maxie Barb, MD       polyethylene glycol (MIRALAX / GLYCOLAX) packet 17 g  17 g  Oral Daily PRN Duayne Cal, NP       rosuvastatin (CRESTOR) tablet 10 mg  10 mg Oral Daily Duayne Cal, NP   10 mg at 01/15/23 5284      Review of Systems: 10 systems reviewed and negative except per interval history/subjective  Physical Exam: Vitals:   01/15/23 1115 01/15/23 1121  BP: (!) 160/87   Pulse: 81   Resp: (!) 24   Temp:  98.8 F (37.1 C)  SpO2: 98%    No intake/output data recorded.  Intake/Output Summary (Last 24 hours) at 01/15/2023 1121 Last data filed at 01/15/2023 0600 Gross per 24 hour  Intake 190.78 ml  Output 2525 ml  Net -2334.22 ml   Constitutional: well-appearing, no acute distress ENMT: ears and nose  without scars or lesions, MMM CV: normal rate, edema in BLE, JVD present Respiratory: clear to auscultation, normal work of breathing Gastrointestinal: soft, non-tender, no palpable masses or hernias Skin: no visible lesions or rashes Psych: alert, appropriate mood and affect   Test Results I personally reviewed new and old clinical labs and radiology tests Lab Results  Component Value Date   NA 134 (L) 01/15/2023   K 3.4 (L) 01/15/2023   CL 93 (L) 01/15/2023   CO2 29 01/15/2023   BUN 17 01/15/2023   CREATININE 4.33 (H) 01/15/2023   GFR 7.97 (LL) 02/14/2021   CALCIUM 8.8 (L) 01/15/2023   ALBUMIN 3.3 (L) 01/14/2023   PHOS 3.1 01/15/2023    CBC Recent Labs  Lab 01/14/23 1545 01/14/23 1553 01/14/23 2134 01/15/23 0259  WBC 7.2  --  5.8 5.5  HGB 9.6* 10.9* 8.4* 9.3*  HCT 31.0* 32.0* 26.7* 29.6*  MCV 93.4  --  92.7 90.0  PLT 195  --  167 186

## 2023-01-15 NOTE — Progress Notes (Deleted)
Patient ID: Monique Henderson                 DOB: Mar 15, 1951                      MRN: [id number]     HPI: Monique Henderson is a 72 y.o. female referred by Dr. Eldridge Dace to HTN clinic. 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 Monique Henderson last seen by MD on 11/16/22 where BP was elevated at 150/72. Home SBP in the 150s as well. Imdur was increased to 60mg  on non dialysis days.  Last visit with PharmD 12/18/22. Monique Henderson presented with family members who assist with medications. Monique Henderson denied sx of hypotension with dialysis. Brought it wrist cuff which was reading ~10 mm Hg lower systolic than clinic cuff (wrist cuff easier to use given fistula for dialysis). Prior readings included (date/time not set): 156/105, 136/82, 160/113,172/121, 113/72, 162/61, 166/72, 121/79, 172/77, 132/72, 163/82, 122/71. Monique Henderson recalled better readings in the morning. Sometimes Monique Henderson was late with changing clonidine patch which may be causing elevated BP readings. Also reported itching on clonidine patch. Clonidine patch was changed to oral clonidine 0.1 mg BID. Hydralazine was changed from 75 mg TID (was taking 3 tabs of 25 mg TID) to 100 mg TID for better BP control and to decrease pill burden. Amlodipine, carvedilol, and Imdur were continued. Planned to update time on her BP cuff and bring in to next visit.    Current HTN meds:  Amlodipine 10mg  daily - AM Carvedilol 25mg  BID  Clonidine 0.1 mg BID Hydralazine 100mg  TID - morning, afternoon, bedtime  Imdur 60mg  daily except 30mg  on dialysis days  Previously tried: lisinopril - cough  BP goal: <130/79mmHg  Family History: The patient's family history includes Bone cancer (age of onset: 60) in her sister; Colon cancer (age of onset: 28) in her mother; Diabetes in her  brother, brother, and son; Diabetes (age of onset: 41) in her sister; Hypertension in her brother, brother, mother, sister, and son; Kidney disease in her son; Multiple sclerosis in her son.   Social History: The patient  reports that Monique Henderson quit smoking about 2 years ago. Her smoking use included cigarettes. Monique Henderson has a 11.00 pack-year smoking history. Monique Henderson has never used smokeless tobacco. Monique Henderson reports that Monique Henderson does not currently use alcohol after a past usage of about 2.0 standard drinks of alcohol per week. Monique Henderson reports that Monique Henderson does not currently use drugs after having used the following drugs: Cocaine.   Diet: Doesn't add salt to food. Minimal caffeine.  Wt Readings from Last 3 Encounters:  01/15/23 108 lb 3.9 oz (49.1 kg)  11/16/22 109 lb (49.4 kg)  11/16/22 108 lb (49 kg)   BP Readings from Last 3 Encounters:  01/15/23 (!) 171/96  12/18/22 (!) 148/86  11/16/22 (!) 150/72   Pulse Readings from Last 3 Encounters:  01/15/23 75  12/18/22 72  11/16/22 82    Renal function: Estimated Creatinine Clearance: 9.4 mL/min (A) (by C-G formula based on SCr of 4.33 mg/dL (H)).  Past Medical History:  Diagnosis Date   AF (paroxysmal atrial fibrillation) (HCC) 05/29/2019   on Coumadin   Aortic atherosclerosis (HCC) 07/05/2019   Aortic dissection (HCC) 04/04/2019   s/p repair   Bone spur 2008   Right calcaneal foot  spur   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 Chevis Pretty; S/P re-excision of superior and inferior  margins 10/27/2012.    ESRD on hemodialysis (HCC) 05/29/2019   Essential hypertension 09/16/2006   GERD 09/16/2006   Hepatitis C    treated and RNA confirmed not detectable 01/2017   Insomnia 03/14/2015   Malnutrition of moderate degree 05/19/2019   Non compliance with medical treatment 12/04/2017   Normocytic anemia    With thrombocytosis   Osteoarthritis    Right ureteral stone 2002   S/P lumbar spinal fusion 01/18/2014   S/P lumbar decompressive laminectomy, fusion, and plating for lumbar spinal stenosis on 05/27/2009 by Dr. Tia Alert.  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 Tia Alert.     Stroke (cerebrum) (HCC)    Tobacco use disorder 04/19/2009   Uterine fibroid    Wears dentures    top    Current Facility-Administered Medications on File Prior to Visit  Medication Dose Route Frequency Provider Last Rate Last Admin   albuterol (PROVENTIL) (2.5 MG/3ML) 0.083% nebulizer solution 2.5 mg  2.5 mg Nebulization Q6H PRN Calton Dach I, RPH       alteplase (CATHFLO ACTIVASE) injection 2 mg  2 mg Intracatheter Once PRN Maxie Barb, MD       amLODipine (NORVASC) tablet 10 mg  10 mg Oral Daily Duayne Cal, NP       anticoagulant sodium citrate solution 5 mL  5 mL Intracatheter PRN Maxie Barb, MD       apixaban Everlene Balls) tablet 2.5 mg  2.5 mg Oral BID Duayne Cal, NP       carvedilol (COREG) tablet 25 mg  25 mg Oral BID WC Duayne Cal, NP       cloNIDine (CATAPRES) tablet 0.1 mg  0.1 mg Oral BID Duayne Cal, NP       docusate sodium (COLACE) capsule 100 mg  100 mg Oral BID PRN Duayne Cal, NP       heparin injection 1,000 Units  1,000 Units Intracatheter PRN Maxie Barb, MD       hydrALAZINE (APRESOLINE) injection 5-10 mg  5-10 mg  Intravenous Q4H PRN Migdalia Dk, MD   5 mg at 01/15/23 0051   hydrALAZINE (APRESOLINE) tablet 100 mg  100 mg Oral Q8H Duayne Cal, NP   100 mg at 01/15/23 0534   lidocaine (PF) (XYLOCAINE) 1 % injection 5 mL  5 mL Intradermal PRN Maxie Barb, MD       lidocaine-prilocaine (EMLA) cream 1 Application  1 Application Topical PRN Maxie Barb, MD       metoprolol tartrate (LOPRESSOR) injection 2.5-5 mg  2.5-5 mg Intravenous Q3H PRN Duayne Cal, NP   2.5 mg at 01/14/23 2255   nitroGLYCERIN 50 mg in dextrose 5 % 250 mL (  0.2 mg/mL) infusion  0-200 mcg/min Intravenous Continuous Rondel Baton, MD 42 mL/hr at 01/15/23 0600 140 mcg/min at 01/15/23 0600   pantoprazole (PROTONIX) EC tablet 40 mg  40 mg Oral BID Duayne Cal, NP       pentafluoroprop-tetrafluoroeth (GEBAUERS) aerosol 1 Application  1 Application Topical PRN Maxie Barb, MD       polyethylene glycol (MIRALAX / GLYCOLAX) packet 17 g  17 g Oral Daily PRN Duayne Cal, NP       rosuvastatin (CRESTOR) tablet 10 mg  10 mg Oral Daily Duayne Cal, NP       Current Outpatient Medications on File Prior to Visit  Medication Sig Dispense Refill   albuterol (VENTOLIN HFA) 108 (90 Base) MCG/ACT inhaler Inhale 2 puffs into the lungs every 6 (six) hours as needed for wheezing or shortness of breath. 8.5 g 0   amLODipine (NORVASC) 10 MG tablet Take 1 tablet (10 mg total) by mouth daily. 30 tablet 0   apixaban (ELIQUIS) 2.5 MG TABS tablet Take 1 tablet (2.5 mg total) by mouth 2 (two) times daily. 60 tablet 0   carvedilol (COREG) 25 MG tablet Take 1 tablet (25 mg total) by mouth 2 (two) times daily with a meal. 60 tablet 1   Cholecalciferol (VITAMIN D-3 PO) Take 1 capsule by mouth daily.     cloNIDine (CATAPRES) 0.1 MG tablet Take 1 tablet (0.1 mg total) by mouth 2 (two) times daily. 60 tablet 11   hydrALAZINE (APRESOLINE) 100 MG tablet Take 1 tablet (100 mg total) by mouth every 8 (eight) hours. 270  tablet 3   isosorbide mononitrate (IMDUR) 30 MG 24 hr tablet TAKE 1 TABLET (30MG ) ON DIALYSIS DAYS AND 2 TABLETS (60 MG) ON NON DIALYSIS DAYS. 45 tablet 10   naloxone (NARCAN) nasal spray 4 mg/0.1 mL 1 spray as directed.     oxyCODONE-acetaminophen (PERCOCET) 10-325 MG tablet Take 1 tablet by mouth 3 (three) times daily as needed for pain.     pantoprazole (PROTONIX) 40 MG tablet TAKE 1 TABLET (40 MG TOTAL) BY MOUTH 2 (TWO) TIMES DAILY. 60 tablet 1   rosuvastatin (CRESTOR) 10 MG tablet TAKE 1 TABLET (10 MG TOTAL) BY MOUTH DAILY FOR CHOLESTEROL (Patient taking differently: Take 10 mg by mouth daily.) 90 tablet 0   senna-docusate (SENOKOT-S) 8.6-50 MG tablet Take 1 tablet by mouth 2 (two) times daily between meals as needed for mild constipation. 180 tablet 0   sevelamer carbonate (RENVELA) 2.4 g PACK Take 2.4 g by mouth 3 (three) times daily with meals. 90 each 0   traZODone (DESYREL) 50 MG tablet Take 0.5 tablets (25 mg total) by mouth at bedtime as needed for sleep. 45 tablet 0    Allergies  Allergen Reactions   Shrimp [Shellfish Allergy] Shortness Of Breath   Bactroban [Mupirocin] Other (See Comments)    "Sores in nose"   Hydrocodone Itching, Nausea Only and Nausea And Vomiting   Chlorhexidine Gluconate Itching   Tylenol [Acetaminophen] Itching   Zestril [Lisinopril] Cough     Assessment/Plan:  1. Hypertension - BP elevated above goal < 130/64mmHg. Home wrist cuff measuring a bit low compared to clinic cuff today (~86mmHg lower systolic). Will increase hydralazine from 75mg  TID to 100mg  TID for better efficacy and to decrease pill burden (will reduce pill count by 6 tabs as Monique Henderson had been taking 3 of the 25mg  tabs TID). Will change clonidine 0.2mg  patch to oral clonidine 0.1mg  BID as Monique Henderson's been  having itching with the patch and sometimes changes it late which may be the cause of some of her higher BP readings. Monique Henderson'll continue on her other BP meds including amlodipine 10mg  daily, carvedilol  25mg  BID, and Imdur 60mg  except 30mg  on dialysis days. F/u with me in 4 weeks for BP check. Monique Henderson's updating the time on her BP cuff and will bring this in to review readings at next visit.

## 2023-01-15 NOTE — Progress Notes (Deleted)
Per MD and Pharmacy, holding herpain gtt for 3 hours due to an increase in bleeding with suctioning ETT.

## 2023-01-15 NOTE — Progress Notes (Signed)
Received patient at bedside in ICU.  Alert and oriented.  Informed consent signed and in chart.   TX duration:  3.5 Hours  Patient tolerated well, some episodic cramping relieved with NSS flush. Remain in ICU bed, stable condition. Alert, without acute distress.  Hand-off given to patient's nurse.   Access used: RIJ  Access issues: None  Total UF removed: 2500 mL Medication(s) given: None Post HD VS: please see Data insert Post HD weight: Unable to obtain     01/15/23 0315  Vitals  Temp 97.7 F (36.5 C)  Temp Source Axillary  BP (!) 183/108  MAP (mmHg) 130  BP Location Left Arm  BP Method Automatic  Patient Position (if appropriate) Lying  Pulse Rate 71  Pulse Rate Source Monitor  ECG Heart Rate 85  Resp 18  Oxygen Therapy  SpO2 100 %  O2 Device Bi-PAP  FiO2 (%) 35 %  Patient Activity (if Appropriate) In bed  Pulse Oximetry Type Continuous  During Treatment Monitoring  Blood Flow Rate (mL/min) 0 mL/min  Arterial Pressure (mmHg) 0.8 mmHg  Venous Pressure (mmHg) -2.02 mmHg  TMP (mmHg) 15.35 mmHg  Ultrafiltration Rate (mL/min) 1209 mL/min  Dialysate Flow Rate (mL/min) 299 ml/min  Duration of HD Treatment -hour(s) 3.5 hour(s)  Cumulative Fluid Removed (mL) per Treatment  2500.19  Post Treatment  Dialyzer Clearance Lightly streaked  Hemodialysis Intake (mL) 500 mL  Liters Processed 84  Fluid Removed (mL) 2500 mL  Tolerated HD Treatment Yes  Note  Patient Observations Patient completed tx with episodic cramping to BLE thighs and back relieved with NSS bolus. Patient alert, no c/o voiced at this time, condition stable for this d/c.  Hemodialysis Catheter Right Subclavian  No placement date or time found.   Orientation: Right  Access Location: Subclavian  Site Condition No complications  Blue Lumen Status Flushed;Heparin locked;Dead end cap in place  Red Lumen Status Flushed;Heparin locked;Dead end cap in place  Purple Lumen Status N/A  Catheter fill solution  Heparin 1000 units/ml  Catheter fill volume (Arterial) 1.9 cc  Catheter fill volume (Venous) 1.9  Dressing Type Transparent  Dressing Status Antimicrobial disc in place;Clean, Dry, Intact  Drainage Description None  Post treatment catheter status Capped and Clamped     [facility]

## 2023-01-15 NOTE — Progress Notes (Signed)
Heart Failure Navigator Progress Note  Assessed for Heart & Vascular TOC clinic readiness.  Patient does not meet criteria due to ESRD on hemodialysis.   Navigator will sign off at this time.    Dawn Fields, BSN, RN Heart Failure Nurse Navigator Secure Chat Only   

## 2023-01-15 NOTE — Progress Notes (Signed)
PT Cancellation Note  Patient Details Name: Monique Henderson MRN: [id number] DOB: 1951/02/15   Cancelled Treatment:    Reason Eval/Treat Not Completed: Patient declined, no reason specified. Pt refuses PT evaluation, reports she does not feel like moving right now. PT provides education on the benefits of mobility however pt continues to decline. Pt will follow up as time allows.   Arlyss Gandy 01/15/2023, 2:11 PM

## 2023-01-15 NOTE — Progress Notes (Signed)
Notified lab of stat redraw for labs.

## 2023-01-15 NOTE — Progress Notes (Signed)
Patient denies chest pain, will titrate down on Nitroglycerin gtt.

## 2023-01-15 NOTE — Evaluation (Signed)
Occupational Therapy Evaluation Patient Details Name: Monique Henderson MRN: [id number] DOB: 1951-04-20 Today's Date: 01/15/2023   History of Present Illness 72 y.o. female presents to [facility] hospital on 01/14/2023 with respiratory distress after missing HD. Pt hypertensive, with fluid overload. Pt required BiPAP and later developed hypotension in ED. PMH includes ESRD (HD TTS), HTN, PAF, polysubstance abuse, aortic aneurysm dissection s/p repair, afib, stroke.   Clinical Impression   PTA, pt lived at home and was mod I for ADL, siblings assisted with IADL. Upon eval, pt presenting at baseline, mod I for ADL. Donning socks at EOB and performing tub/shower simulated transfer. Pt on phone with insurance company and aware of things going on at home, oriented to situation and verbalizes understanding/implications for missing HD. Pt with decreased R hand ROM but reports she is not interested in continuing therapy and has exercises to do on her hand. Additionally does not wish to work on hand acutely. No further needs identified. OT to sign off. Please re-consult if change in status.       If plan is discharge home, recommend the following: Direct supervision/assist for medications management    Functional Status Assessment  Patient has had a recent decline in their functional status and demonstrates the ability to make significant improvements in function in a reasonable and predictable amount of time.  Equipment Recommendations  None recommended by OT    Recommendations for Other Services       Precautions / Restrictions Precautions Precautions: None Restrictions Weight Bearing Restrictions: No      Mobility Bed Mobility Overal bed mobility: Modified Independent                  Transfers Overall transfer level: Independent                        Balance Overall balance assessment: No apparent balance deficits (not formally assessed)                                          ADL either performed or assessed with clinical judgement   ADL Overall ADL's : Modified independent                                       General ADL Comments: use of compensatory techniques due to R hand decresaed mobility at baseline.     Vision Baseline Vision/History: 0 No visual deficits Ability to See in Adequate Light: 0 Adequate Patient Visual Report: No change from baseline Vision Assessment?: Wears glasses for reading Additional Comments: Pt able to read board from bed and at EOB able to see transparent medication wrapper on floor     Perception         Praxis         Pertinent Vitals/Pain Pain Assessment Pain Assessment: No/denies pain     Extremity/Trunk Assessment Upper Extremity Assessment Upper Extremity Assessment: RUE deficits/detail RUE Deficits / Details: pt with chronic weakness in RUE, 2/5 grip strength, otherwise gorssly 4-/5. Pt with extension contractures, able to fully extend digits, at rest is in resting position, significantly lacking in digit flexion. able to flex digits ~20 degrees   Lower Extremity Assessment Lower Extremity Assessment: Defer to PT evaluation   Cervical / Trunk Assessment Cervical / Trunk  Assessment: Normal   Communication Communication Communication: No apparent difficulties Cueing Techniques: Verbal cues   Cognition Arousal: Alert Behavior During Therapy: WFL for tasks assessed/performed Overall Cognitive Status: Within Functional Limits for tasks assessed                                 General Comments: on phone handling insurance difficulties when first arrived. Oriented, following up to 2 step commands this session     General Comments  VSS on RA    Exercises Exercises: Other exercises Other Exercises Other Exercises: PROM to R hand   Shoulder Instructions      Home Living Family/patient expects to be discharged to:: Private residence Living  Arrangements: Non-relatives/Friends Available Help at Discharge: Family;Available 24 hours/day Type of Home: Apartment Home Access: Stairs to enter [employer] of Steps: pt reports flight of stairs but notes theres a Scientist, product/process development Stairs-Rails: Can reach both Home Layout: One level     Bathroom Shower/Tub: Sponge bathes at baseline;Tub/shower unit;Curtain   Bathroom Toilet: Standard Bathroom Accessibility: Yes   Home Equipment: Agricultural consultant (2 wheels);Cane - single point;BSC/3in1          Prior Functioning/Environment Prior Level of Function : Independent/Modified Independent;Needs assist               ADLs Comments: Independent in ADL and IADL. Reporting she only has difficulty cleaning her kitchen and bathroom. Family drives her to appointments/grocery store and provides pillbox set-up for medication management        OT Problem List: Decreased strength;Decreased range of motion      OT Treatment/Interventions:      OT Goals(Current goals can be found in the care plan section) Acute Rehab OT Goals Patient Stated Goal: go home OT Goal Formulation: With patient  OT Frequency:      Co-evaluation              AM-PAC OT "6 Clicks" Daily Activity     Outcome Measure Help from another person eating meals?: None Help from another person taking care of personal grooming?: None Help from another person toileting, which includes using toliet, bedpan, or urinal?: None Help from another person bathing (including washing, rinsing, drying)?: None Help from another person to put on and taking off regular upper body clothing?: None Help from another person to put on and taking off regular lower body clothing?: None 6 Click Score: 24   End of Session Nurse Communication: Mobility status  Activity Tolerance: Patient tolerated treatment well Patient left: in bed;with call bell/phone within reach;with bed alarm set  OT Visit Diagnosis:  Unsteadiness on feet (R26.81);Muscle weakness (generalized) (M62.81)                Time: 4696-2952 OT Time Calculation (min): 18 min Charges:  OT General Charges $OT Visit: 1 Visit OT Evaluation $OT Eval Low Complexity: 1 Low  Tyler Deis, OTR/L [facility] Acute Rehabilitation Office: [phone]   Myrla Halsted 01/15/2023, 5:19 PM

## 2023-01-15 NOTE — Evaluation (Signed)
Physical Therapy Evaluation Patient Details Name: Monique Henderson MRN: [id number] DOB: 08-09-1950 Today's Date: 01/15/2023  History of Present Illness  72 y.o. female presents to [facility] hospital on 01/14/2023 with respiratory distress after missing HD. Pt hypertensive, with fluid overload. Pt required BiPAP and later developed hypotension in ED. PMH includes ESRD (HD TTS), HTN, PAF, polysubstance abuse, aortic aneurysm dissection s/p repair, afib, stroke.  Clinical Impression  Pt presents to PT without significant deficits compared to baseline. Pt is mobilizing at a modI or independent level. Pt has chronic R hand weakness, but otherwise her strength appears near baseline. Pt ambulates for community distances without balance deviation. Pt has no further acute PT needs at this time. PT signing off.        If plan is discharge home, recommend the following: Help with stairs or ramp for entrance;Assistance with cooking/housework   Can travel by private vehicle        Equipment Recommendations None recommended by PT  Recommendations for Other Services       Functional Status Assessment Patient has not had a recent decline in their functional status     Precautions / Restrictions Precautions Precautions: None Restrictions Weight Bearing Restrictions: No      Mobility  Bed Mobility Overal bed mobility: Modified Independent                  Transfers Overall transfer level: Independent                      Ambulation/Gait Ambulation/Gait assistance: Independent Gait Distance (Feet): 400 Feet Assistive device: None Gait Pattern/deviations: WFL(Within Functional Limits) Gait velocity: functional Gait velocity interpretation: 1.31 - 2.62 ft/sec, indicative of limited community ambulator   General Gait Details: steady step-through gait  Stairs            Wheelchair Mobility     Tilt Bed    Modified Rankin (Stroke Patients Only)       Balance  Overall balance assessment: No apparent balance deficits (not formally assessed)                                           Pertinent Vitals/Pain Pain Assessment Pain Assessment: No/denies pain    Home Living Family/patient expects to be discharged to:: Private residence Living Arrangements: Non-relatives/Friends Available Help at Discharge: Family;Available 24 hours/day Type of Home: Apartment Home Access: Stairs to enter Entrance Stairs-Rails: Can reach both Entrance Stairs-Number of Steps: pt reports flight of stairs but notes theres a Public house manager   Home Layout: One level Home Equipment: Agricultural consultant (2 wheels);Cane - single point;BSC/3in1      Prior Function Prior Level of Function : Independent/Modified Independent                     Extremity/Trunk Assessment   Upper Extremity Assessment Upper Extremity Assessment: RUE deficits/detail RUE Deficits / Details: pt with chronic weakness in RUE, 2/5 grip strength, otherwise gorssly 4-/5    Lower Extremity Assessment Lower Extremity Assessment: Overall WFL for tasks assessed    Cervical / Trunk Assessment Cervical / Trunk Assessment: Normal  Communication   Communication Communication: No apparent difficulties Cueing Techniques: Verbal cues  Cognition Arousal: Alert Behavior During Therapy: WFL for tasks assessed/performed Overall Cognitive Status: Within Functional Limits for tasks assessed  General Comments General comments (skin integrity, edema, etc.): VSS on RA    Exercises     Assessment/Plan    PT Assessment Patient does not need any further PT services  PT Problem List         PT Treatment Interventions      PT Goals (Current goals can be found in the Care Plan section)       Frequency       Co-evaluation               AM-PAC PT "6 Clicks" Mobility  Outcome Measure Help needed turning from  your back to your side while in a flat bed without using bedrails?: None Help needed moving from lying on your back to sitting on the side of a flat bed without using bedrails?: None Help needed moving to and from a bed to a chair (including a wheelchair)?: None Help needed standing up from a chair using your arms (e.g., wheelchair or bedside chair)?: None Help needed to walk in hospital room?: None Help needed climbing 3-5 steps with a railing? : None 6 Click Score: 24    End of Session   Activity Tolerance: Patient tolerated treatment well Patient left: in bed;with call bell/phone within reach;with bed alarm set Nurse Communication: Mobility status PT Visit Diagnosis: Other abnormalities of gait and mobility (R26.89)    Time: 1610-9604 PT Time Calculation (min) (ACUTE ONLY): 19 min   Charges:   PT Evaluation $PT Eval Low Complexity: 1 Low   PT General Charges $$ ACUTE PT VISIT: 1 Visit         Arlyss Gandy, PT, DPT Acute Rehabilitation Office [phone]   Arlyss Gandy 01/15/2023, 4:07 PM

## 2023-01-15 NOTE — Progress Notes (Signed)
PROGRESS NOTE  Monique Henderson HQI:[id number] DOB: Jun 18, 1950   PCP: Fleet Contras, MD  Patient is from: Home.  Lives with boyfriend.  Independently ambulates at baseline.  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  BP elevated to 229/122.  Portable CXR with cardiomegaly, vascular congestion, pulmonary edema and probable small pleural effusions.  CT angio chest with moderate right and small left pleural effusion and associated atelectasis or consolidation, small volume ascites.  Patient was started on BiPAP and nitro drip and as needed IV metoprolol.  Nephrology consulted and she had HD with ultrafiltration of 2.5 L.  Patient was transferred to hospitalist service on 8/16.  Blood pressure and RVR improved but remained on nitro drip.  Respiratory failure resolved.    Subjective: Seen and examined earlier this morning.  No major events overnight of this morning.  Sleepy but wakes to voice.  Oriented x 4.  No complaints.  Denies chest pain, dyspnea, palpitation or new focal neurosymptoms.  Objective: Vitals:   01/15/23 1045 01/15/23 1100 01/15/23 1115 01/15/23 1121  BP: (!) 168/93 (!) 163/94 (!) 160/87   Pulse: 84 81 81   Resp: (!) 21 (!) 22 (!) 24   Temp:    98.8 F (37.1 C)  TempSrc:    Oral  SpO2: 97% 98% 98%   Weight:        Examination:  GENERAL: No apparent distress.  Nontoxic. HEENT: MMM.  Vision and hearing grossly intact.  NECK: Supple.  No apparent JVD.  RESP:  No IWOB.  Fair aeration bilaterally. CVS:  Irregular rhythm.  HR ranges from 100-1 140s.  Heart sounds normal.  ABD/GI/GU: BS+. Abd soft, NTND.  MSK/EXT:  Moves extremities. No apparent deformity. No edema.  SKIN: no apparent skin lesion or wound NEURO: Awake, alert and oriented appropriately.  Dysarthria and septal L UE weakness (was chronic). PSYCH: Calm. Normal affect.   Procedures:  None  Microbiology summarized: COVID-19 PCR nonreactive.  Assessment and plan: Principal Problem:   Hypertensive emergency Active Problems:   Acute on chronic combined systolic and diastolic CHF (congestive heart failure) (HCC)   Atrial fibrillation with RVR (HCC)   ESRD (end stage renal disease) (HCC)   Acute respiratory failure with hypoxia (HCC)  Respiratory distress in the setting of CHF exacerbation, fluid overload and A-fib with RVR: Required BiPAP initially.  Had HD with ultrafiltration.  Respiratory distress resolved. -Treat treatable causes-hypertension, A-fib and ESRD -Incentive telemetry, OOB/PT/OT -Change albuterol to Xopenex in the setting of RVR. -BiPAP as needed   Acute on chronic combined CHF: TTE in 06/2022 with LVEF of 45 to 50%, G2 DD, moderate TR, and normal RVSP.  CXR and CT chest concerning for fluid overload.  She is ESRD patient on HD.  Missed 1 HD session.  Underwent ultrafiltration with removal of 2.5 L.  Respiratory distress resolved.  Blood pressure improved. -Fluid management by HD -Home cardiac meds resumed. -Fluid restriction  Hypertensive emergency: BP improved but remains on nitro drip.  Patient with prior history of aortic dissection.  Imaging without dissection or significant aneurysm. -Wean off nitro drip as able -Home amlodipine, Coreg, clonidine, hydralazine resumed this morning. -Change as needed hydralazine to as needed labetalol given A-fib with RVR.    Atrial fib with RVR: Improved.  HR ranges from 70s to 100.  Now symptomatic. -Coreg resumed this morning. -IV metoprolol as needed -Continue  Eliquis for anticoagulation -Optimize electrolytes   Chest pain/elevated troponin: Troponin elevated to 60s but no significant delta.  Likely demand ischemia.  Chest pain resolved. -Blood pressure control as above  COPD without acute exacerbation -Change albuterol to Xopenex   ESRD on HD -HD per nephrology  History of CVA with residual LUE weakness and dysarthria: Unchanged from baseline per patient. -PT/OT eval  Hypokalemia/hypomagnesemia -Monitor replenish as appropriate  Goal of care counseling: Admitted as full code.  Noted DNR paper in ACP.  Discussed this with patient.  She confirmed that she does not want cardiopulmonary resuscitation such as chest compression, intubation and choking in an event of sudden cardiopulmonary arrest. -Changed CODE STATUS to DNR.  Notified patient's RN in person.   Body mass index is 19.17 kg/m. Consult dietitian.          DVT prophylaxis:  apixaban (ELIQUIS) tablet 2.5 mg Start: 01/15/23 1000 apixaban (ELIQUIS) tablet 2.5 mg  Code Status:  Family Communication: None at bedside. Level of care: ICU Status is: Inpatient Remains inpatient appropriate because: Hypertensive emergency requiring nitro drip   Final disposition: Likely home once medically clear. Consultants:  Pulmonology admitted patient.   Sch Meds:  Scheduled Meds:  amLODipine  10 mg Oral Daily   apixaban  2.5 mg Oral BID   carvedilol  25 mg Oral BID WC   cloNIDine  0.1 mg Oral BID   hydrALAZINE  100 mg Oral Q8H   pantoprazole  40 mg Oral BID   rosuvastatin  10 mg Oral Daily   Continuous Infusions:  anticoagulant sodium citrate     nitroGLYCERIN 166.667 mcg/min (01/15/23 0857)   PRN Meds:.alteplase, anticoagulant sodium citrate, docusate sodium, heparin, labetalol, levalbuterol, lidocaine (PF), lidocaine-prilocaine, metoprolol tartrate, pentafluoroprop-tetrafluoroeth, polyethylene glycol  Antimicrobials: Anti-infectives (From admission, onward)    None         I have personally reviewed the following labs and images: CBC: Recent Labs  Lab 01/14/23 1545 01/14/23 1553 01/14/23 2134 01/15/23 0259  WBC 7.2  --  5.8 5.5  HGB 9.6* 10.9* 8.4* 9.3*  HCT 31.0* 32.0* 26.7* 29.6*  MCV 93.4  --  92.7 90.0  PLT 195  --  167 186   BMP &GFR Recent Labs  Lab 01/14/23 1545 01/14/23 1553 01/14/23 2134 01/15/23 0259 01/15/23 0541  NA 138 139 139 134* 134*  K 4.4 4.4 4.6 2.1* 3.4*  CL 97*  --  97* 89* 93*  CO2 24  --  27 30 29   GLUCOSE 99  --  100* 89 98  BUN 42*  --  46* <5* 17  CREATININE 8.91*  --  9.22* 1.10* 4.33*  CALCIUM 9.4  --  9.1 9.3 8.8*  MG  --   --   --  1.5* 1.5*  PHOS  --   --  6.3* <1.0* 3.1   Estimated Creatinine Clearance: 9.4 mL/min (A) (by C-G formula based on SCr of 4.33 mg/dL (H)). Liver & Pancreas: Recent Labs  Lab 01/14/23 2134  ALBUMIN 3.3*   No results for input(s): "LIPASE", "AMYLASE" in the last 168 hours. No results for input(s): "AMMONIA" in the last 168 hours. Diabetic:  No results for input(s): "HGBA1C" in the last 72 hours. Recent Labs  Lab 01/14/23 2324  GLUCAP 82   Cardiac Enzymes: No results for input(s): "CKTOTAL", "CKMB", "CKMBINDEX", "TROPONINI" in the last 168 hours. No results for input(s): "PROBNP" in the last 8760 hours. Coagulation Profile: No results for input(s): "INR", "PROTIME" in the last 168 hours. Thyroid Function Tests: No results for input(s): "TSH", "T4TOTAL", "FREET4", "T3FREE", "THYROIDAB" in the last 72 hours. Lipid Profile: No results for input(s): "CHOL", "HDL", "LDLCALC", "TRIG", "CHOLHDL", "LDLDIRECT" in the last 72 hours. Anemia Panel: No results for input(s): "VITAMINB12", "FOLATE", "FERRITIN", "TIBC", "IRON", "RETICCTPCT" in the last 72 hours. Urine analysis:    Component Value Date/Time   COLORURINE AMBER (A) 12/27/2021 1840   APPEARANCEUR TURBID (A) 12/27/2021 1840   LABSPEC 1.013 12/27/2021 1840   PHURINE 8.0 12/27/2021 1840   GLUCOSEU NEGATIVE  12/27/2021 1840   GLUCOSEU NEG mg/dL 16/03/9603 5409   HGBUR MODERATE (A) 12/27/2021 1840   BILIRUBINUR NEGATIVE 12/27/2021 1840   BILIRUBINUR negative 05/04/2018 0910   KETONESUR NEGATIVE 12/27/2021 1840   PROTEINUR >=300 (A) 12/27/2021 1840   UROBILINOGEN 0.2 05/04/2018 0910   UROBILINOGEN 1 02/14/2014 0946   NITRITE NEGATIVE 12/27/2021 1840   LEUKOCYTESUR LARGE (A) 12/27/2021 1840   Sepsis Labs: Invalid input(s): "PROCALCITONIN", "LACTICIDVEN"  Microbiology: Recent Results (from the past 240 hour(s))  SARS Coronavirus 2 by RT PCR (hospital order, performed in [facility] hospital lab) *cepheid single result test* Anterior Nasal Swab     Status: None   Collection Time: 01/14/23  4:20 PM   Specimen: Anterior Nasal Swab  Result Value Ref Range Status   SARS Coronavirus 2 by RT PCR NEGATIVE NEGATIVE Final    Comment: Performed at [facility], [address], [place], [place] [address]    Radiology Studies: CT ANGIO CHEST/ABD/PEL FOR DISSECTION W &/OR WO CONTRAST  Result Date: 01/14/2023 CLINICAL DATA:  Acute aortic syndrome suspected chest pain EXAM: CT ANGIOGRAPHY CHEST, ABDOMEN AND PELVIS TECHNIQUE: Non-contrast CT of the chest was initially obtained. Multidetector CT imaging through the chest, abdomen and pelvis was performed using the standard protocol during bolus administration of intravenous contrast. Multiplanar reconstructed images and MIPs were obtained and reviewed to evaluate the vascular anatomy. RADIATION DOSE REDUCTION: This exam was performed according to the departmental dose-optimization program which includes automated exposure control, adjustment of the mA and/or kV according to patient size and/or use of iterative reconstruction technique. CONTRAST:  75mL OMNIPAQUE IOHEXOL 350 MG/ML SOLN COMPARISON:  01/29/2022 FINDINGS: CTA CHEST FINDINGS VASCULAR Aorta: Satisfactory opacification of the aorta. Unchanged contour and caliber of the thoracic aorta status post  graft repair of the ascending thoracic aorta. No evidence of aneurysm, dissection, or other acute aortic pathology. Severe mixed aortic atherosclerosis. Large-bore right neck multi lumen vascular catheter Cardiovascular: No evidence of pulmonary embolism on limited non-tailored examination. Cardiomegaly. No pericardial effusion. Review of the MIP images confirms the above findings. NON VASCULAR Mediastinum/Nodes: No enlarged mediastinal, hilar, or axillary lymph nodes. Thyroid gland, trachea, and esophagus demonstrate no significant findings. Lungs/Pleura: Moderate right, small left pleural effusions and associated atelectasis or consolidation. Musculoskeletal: No chest wall abnormality. No acute osseous findings. Review of the MIP images confirms the above findings. CTA ABDOMEN AND PELVIS FINDINGS VASCULAR Normal contour and caliber of the abdominal aorta. No evidence of aneurysm, dissection, or other acute aortic pathology. Standard branching pattern of the abdominal aorta with solitary bilateral renal arteries. Severe mixed aortic atherosclerosis. Review of the MIP images confirms  the above findings. NON-VASCULAR Hepatobiliary: No solid liver abnormality is seen. No gallstones, gallbladder wall thickening, or biliary dilatation. Pancreas: Unremarkable. No pancreatic ductal dilatation or surrounding inflammatory changes. Spleen: Normal in size without significant abnormality. Adrenals/Urinary Tract: Adrenal glands are unremarkable. Atrophic kidneys. Small benign, intrinsically hyperdense lesion of the posterior superior pole of the left kidney unchanged, consistent with a small hemorrhagic or proteinaceous cyst, requiring no specific further follow-up or characterization (series 5, image 159). No calculi or hydronephrosis. Bladder is unremarkable. Stomach/Bowel: Stomach is within normal limits. Appendix appears normal. No evidence of bowel wall thickening, distention, or inflammatory changes. Sigmoid  diverticulosis. Lymphatic: No enlarged abdominal or pelvic lymph nodes. Reproductive: Calcified uterine fibroids. Other: No abdominal wall hernia or abnormality. Small volume ascites throughout the abdomen and pelvis. Musculoskeletal: No acute osseous findings. Renal osteodystrophy. Unchanged fatty herniations of the left flank (series 5, image 176). IMPRESSION: 1. 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 No evidence of aneurysm, dissection, or other acute aortic pathology. 2. Severe mixed aortic atherosclerosis. 3. Moderate right, small left pleural effusions and associated atelectasis or consolidation. 4. Small volume ascites throughout the abdomen and pelvis. 5. Sigmoid diverticulosis without evidence of acute diverticulitis. Aortic Atherosclerosis (ICD10-I70.0). Electronically Signed   By: Jearld Lesch M.D.   On: 01/14/2023 19:32   DG Chest Portable 1 View  Result Date: 01/14/2023 CLINICAL DATA:  Shortness of breath EXAM: PORTABLE CHEST 1 VIEW COMPARISON:  09/25/2022 FINDINGS: Post sternotomy changes. Cardiomegaly with probable small pleural effusions. Vascular congestion and diffuse interstitial and ground-glass opacity consistent with pulmonary edema. Right-sided central venous catheter tip over the right atrium. Aortic atherosclerosis IMPRESSION: Cardiomegaly with vascular congestion and pulmonary edema. Probable small pleural effusions. Electronically Signed   By: Jasmine Pang M.D.   On: 01/14/2023 17:22    CRITICAL CARE Performed by: Almon Hercules   Total critical care time: 55 minutes  Critical care time was exclusive of separately billable procedures and treating other patients.  Critical care was necessary to treat or prevent imminent or life-threatening deterioration.  Critical care was time spent personally by me on the following activities: development of treatment plan with patient and/or surrogate as well as  nursing, discussions with consultants, evaluation of patient's response to treatment, examination of patient, obtaining history from patient or surrogate, ordering and performing treatments and interventions, ordering and review of laboratory studies, ordering and review of radiographic studies, pulse oximetry and re-evaluation of patient's condition.   Camila Norville T. Saima Monterroso [facility]  If 7PM-7AM, please contact night-coverage [email or web address] 01/15/2023, 11:22 AM

## 2023-01-15 NOTE — TOC CM/SW Note (Signed)
Transition of Care [facility]) - Inpatient Brief Assessment   Patient Details  Name: GIAVANNI KASH MRN: [id number] Date of Birth: [birth year]  Transition [facility]) CM/SW Contact:    Tom-Johnson, Hershal Coria, RN Phone Number: 01/15/2023, 12:36 PM   Clinical Narrative:  Patient presented to the ED with Shortness Of Breath and Chest Pain. Found to have BP at 220/130, admitted with for Hypertensive Emergency, Afib/RVR and Acute Hypoxemic Resp Failure. Has Hx of ESRD, on TTS Outpatient Dialysis schedule.  Patient was started on NTG gtt and BIPAP. Nephrology following.   No TOC needs or recommendations noted at this time. Patient not Medically ready for discharge.  CM will continue to follow as patient progresses with care towards discharge.      Transition of Care Asessment: Insurance and Status: Insurance coverage has been reviewed Patient has primary care physician: Yes Home environment has been reviewed: Yes Prior level of function:: Independent Prior/Current Home Services: No current home services Social Determinants of Health Reivew: SDOH reviewed no interventions necessary Readmission risk has been reviewed: Yes Transition of care needs: no transition of care needs at this time

## 2023-01-16 DIAGNOSIS — Z66 Do not resuscitate: Secondary | ICD-10-CM

## 2023-01-16 DIAGNOSIS — N186 End stage renal disease: Secondary | ICD-10-CM | POA: Diagnosis not present

## 2023-01-16 DIAGNOSIS — R0603 Acute respiratory distress: Secondary | ICD-10-CM

## 2023-01-16 DIAGNOSIS — I161 Hypertensive emergency: Secondary | ICD-10-CM | POA: Diagnosis not present

## 2023-01-16 LAB — CBC
HCT: 27.6 % — ABNORMAL LOW (ref 36.0–46.0)
Hemoglobin: 8.9 g/dL — ABNORMAL LOW (ref 12.0–15.0)
MCH: 29.7 pg (ref 26.0–34.0)
MCHC: 32.2 g/dL (ref 30.0–36.0)
MCV: 92 fL (ref 80.0–100.0)
Platelets: 187 10*3/uL (ref 150–400)
RBC: 3 MIL/uL — ABNORMAL LOW (ref 3.87–5.11)
RDW: 16.9 % — ABNORMAL HIGH (ref 11.5–15.5)
WBC: 4.2 10*3/uL (ref 4.0–10.5)
nRBC: 0 % (ref 0.0–0.2)

## 2023-01-16 LAB — RENAL FUNCTION PANEL
Albumin: 3.4 g/dL — ABNORMAL LOW (ref 3.5–5.0)
Anion gap: 14 (ref 5–15)
BUN: 9 mg/dL (ref 8–23)
CO2: 28 mmol/L (ref 22–32)
Calcium: 9.2 mg/dL (ref 8.9–10.3)
Chloride: 93 mmol/L — ABNORMAL LOW (ref 98–111)
Creatinine, Ser: 2.95 mg/dL — ABNORMAL HIGH (ref 0.44–1.00)
GFR, Estimated: 17 mL/min — ABNORMAL LOW (ref >60)
Glucose, Bld: 129 mg/dL — ABNORMAL HIGH (ref 70–99)
Phosphorus: 2.1 mg/dL — ABNORMAL LOW (ref 2.5–4.6)
Potassium: 4.4 mmol/L (ref 3.5–5.1)
Sodium: 135 mmol/L (ref 135–145)

## 2023-01-16 LAB — MAGNESIUM: Magnesium: 1.7 mg/dL (ref 1.7–2.4)

## 2023-01-16 MED ORDER — AMLODIPINE BESYLATE 5 MG PO TABS
10.0000 mg | ORAL_TABLET | Freq: Every day | ORAL | Status: DC
  Administered 2023-01-16: 10 mg via ORAL
  Filled 2023-01-16 (×2): qty 2

## 2023-01-16 MED ORDER — HYDRALAZINE HCL 20 MG/ML IJ SOLN
20.0000 mg | INTRAMUSCULAR | Status: AC
  Administered 2023-01-16: 20 mg via INTRAVENOUS
  Filled 2023-01-16: qty 1

## 2023-01-16 MED ORDER — MAGNESIUM SULFATE 2 GM/50ML IV SOLN: 2.0000 g | Freq: Once | INTRAVENOUS | Status: DC

## 2023-01-16 MED ORDER — CARVEDILOL 12.5 MG PO TABS
25.0000 mg | ORAL_TABLET | Freq: Two times a day (BID) | ORAL | Status: DC
  Administered 2023-01-16: 25 mg via ORAL
  Filled 2023-01-16: qty 2

## 2023-01-16 MED ORDER — SODIUM PHOSPHATES 45 MMOLE/15ML IV SOLN
20.0000 mmol | Freq: Once | INTRAVENOUS | Status: DC
  Filled 2023-01-16: qty 6.67

## 2023-01-16 NOTE — Progress Notes (Signed)
Received patient at bedside in ICU.  Alert and oriented.  Informed consent signed and in chart.   TX duration: 3.5 hours  Patient tolerated well.  Patient remains in ICU, condition stable. Alert, without acute distress.  Hand-off given to patient's nurse.   Access used: RIJ TDC Access issues: None  Total UF removed: 3500 mL Medication(s) given: None Post HD VS: please see Data Insert Post HD weight: Unable to obtain    01/16/23 0130  Vitals  Temp 98.6 F (37 C)  Temp Source Oral  BP (!) 165/95  MAP (mmHg) 116  BP Location Left Arm  BP Method Automatic  Patient Position (if appropriate) Lying  Pulse Rate 75  Pulse Rate Source Monitor  ECG Heart Rate 78  Resp (!) 22  Oxygen Therapy  SpO2 97 %  O2 Device Room Air  Patient Activity (if Appropriate) In bed  Pulse Oximetry Type Continuous  During Treatment Monitoring  Blood Flow Rate (mL/min) 0 mL/min  Arterial Pressure (mmHg) 38.98 mmHg  Venous Pressure (mmHg) -232.92 mmHg  TMP (mmHg) 20 mmHg  Ultrafiltration Rate (mL/min) 1191 mL/min  Dialysate Flow Rate (mL/min) 300 ml/min  Duration of HD Treatment -hour(s) 3.5 hour(s)  Cumulative Fluid Removed (mL) per Treatment  3500.22  Post Treatment  Dialyzer Clearance Lightly streaked  Hemodialysis Intake (mL) 0 mL  Liters Processed 84  Fluid Removed (mL) 3500 mL  Tolerated HD Treatment Yes  Post-Hemodialysis Comments Treatment completed without issue, patient tolerated well.  Note  Patient Observations Patient A&O x 4, no c/o voiced, no acute distress noted. Patient stable for this treatment d/c.  Hemodialysis Catheter Right Subclavian  No placement date or time found.   Orientation: Right  Access Location: Subclavian  Site Condition No complications  Blue Lumen Status Flushed;Heparin locked;Dead end cap in place  Red Lumen Status Flushed;Heparin locked;Dead end cap in place  Purple Lumen Status N/A  Catheter fill solution Heparin 1000 units/ml  Catheter fill volume  (Arterial) 1.9 cc  Catheter fill volume (Venous) 1.9  Dressing Type Transparent  Dressing Status Antimicrobial disc in place;Clean, Dry, Intact  Drainage Description None  Post treatment catheter status Capped and Clamped      [facility]

## 2023-01-16 NOTE — Progress Notes (Signed)
Pharmacy Electrolyte Replacement  Recent Labs:  Recent Labs    01/16/23 0351  K 4.4  MG 1.7  PHOS 2.1*  CREATININE 2.95*     Plan: NaPhos IV, Mag sulfate 2gm IV Labs in AM  Mackenzee Becvar D. Laney Potash, PharmD, BCPS, BCCCP 01/16/2023, 10:12 AM

## 2023-01-16 NOTE — Plan of Care (Signed)
[facility] Patient Discharge Orders- [facility] CLINIC: [facility]  Patient's name: Monique Henderson Admit/DC Dates: 01/14/2023 - 01/16/2023  Discharge Diagnoses: Volume overload/CHF exacerbation  AF/RVR  Hypertensive emergency    Aranesp:  --   Date and amount of last dose: --  Last Hgb: 8.9 PRBC's -- Date/# of units: -- ESA dose for discharge: Mircera 100 mcg IV q 2 weeks  IV Iron dose at discharge: No change   Heparin change: --  New EDW: 45.5kg   Bath Change: --  Access intervention/Change: -- Details:  Hectorol/Calcitriol change: ---  Discharge Labs: Calcium 9.2 Phosphorus 2.1 Albumin 3.4 K+ 4.4  IV Antibiotics: --   On Coumadin?:--    OTHER/APPTS/LAB ORDERS:    D/C Meds to be reconciled by nurse after every discharge.  Completed By:  Tomasa Blase PA-C 01/16/2023,3:23 PM   Reviewed by: MD:______ RN_______

## 2023-01-16 NOTE — Discharge Summary (Addendum)
Physician Discharge Summary  Monique Henderson XLK:[id number] DOB: [birth year] DOA: 01/14/2023  PCP: Fleet Contras, MD  Admit date: 01/14/2023 Discharge date: 01/16/2023 Admitted From: Home Disposition: Home Recommendations for Outpatient Follow-up:  Follow up with PCP in 1 week Reassess BP and compliance at follow-up. Please follow up on the following pending results: None  Home Health: Not indicated Equipment/Devices: Not indicated  Discharge Condition: Stable CODE STATUS: Full code  Follow-up Information     Fleet Contras, MD. Schedule an appointment as soon as possible for a visit in 1 week(s).   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  BP elevated to 229/122.  Portable CXR with cardiomegaly, vascular congestion, pulmonary edema and probable small pleural effusions.  CT angio chest with moderate right and small left pleural effusion and associated atelectasis or consolidation, small volume ascites.  Patient was started on BiPAP and nitro drip and as needed IV metoprolol.  Nephrology consulted and she had emergent HD with ultrafiltration of 2.5 L.   Patient was transferred to hospitalist service on 8/16.  Blood pressure and RVR improved.  Eventually came off nitro drip.   Patient had 2 HD sessions with removal of 5.5 L fluid total.  Respiratory distress and RVR resolved.  Blood pressure normalized.  Felt well and ready to go home.  Counseled on the importance of taking her medications as prescribed and going to  dialysis outpatient as scheduled.  See individual problem list below for more.   Problems addressed during this hospitalization Principal Problem:   Hypertensive emergency Active Problems:   Acute on chronic combined systolic and diastolic CHF (congestive heart failure) (HCC)   Atrial fibrillation with RVR (HCC)   ESRD (end stage renal disease) (HCC)   Respiratory distress determined by examination   DNR (do not resuscitate)   Advance care planning   Respiratory distress in the setting of CHF exacerbation, fluid overload and A-fib with RVR: Required BiPAP initially.  Had HD with ultrafiltration.  Respiratory distress resolved.  Respiratory failure ruled out. -Emphasized the importance of blood pressure control, fluid restriction and dialysis on schedule.   Acute on chronic combined CHF: TTE in 06/2022 with LVEF of 45 to 50%, G2 DD, moderate TR, and normal RVSP.  CXR and CT chest concerning for fluid overload.  She is ESRD patient on HD.  Missed 1 HD session.  Underwent to HD with removal of 5.5 L total.  Appears euvolemic on exam. -Advised on fluid restriction -Continue dialysis outpatient  Hypertensive emergency: Resolved.  Blood pressure within normal. -Continue home amlodipine, Coreg, hydralazine, clonidine and Imdur.   Atrial fib with RVR: RVR resolved. -Continue home Coreg and Eliquis   Chest pain/elevated troponin: Troponin elevated to 60s but no significant delta.  Likely demand ischemia.  Chest pain resolved.  COPD without acute exacerbation continue home meds   ESRD on HD: Had 2 HD sessions with removal of 5.5 L. -HD outpatient   History of CVA with residual LUE weakness and dysarthria: Unchanged from baseline per  patient.  Evaluated by therapy and no need identified.   Hypokalemia/hypomagnesemia/hypophosphatemia: Resolved.   Advance care planning: Confirmed DNR/DNI.  She has ACP note as well.           Time spent 35 minutes  Vital signs Vitals:   01/16/23  0815 01/16/23 0850 01/16/23 1015 01/16/23 1100  BP: (!) 170/87 (!) 154/79 135/79 131/75  Pulse: 73 75 75 71  Temp:      Resp: (!) 23 (!) 21 (!) 22 (!) 24  Weight:      SpO2: 96% 96% 97% 97%  TempSrc:         Discharge exam GENERAL: No apparent distress.  Nontoxic. HEENT: MMM.  Vision and hearing grossly intact.  NECK: Supple.  No apparent JVD.  RESP:  No IWOB.  Fair aeration bilaterally. CVS: Irregular rhythm.  HR ranges from 100-1 140s.  Heart sounds normal.  ABD/GI/GU: BS+. Abd soft, NTND.  MSK/EXT:  Moves extremities. No apparent deformity. No edema.  SKIN: no apparent skin lesion or wound NEURO: Awake, alert and oriented appropriately.  Dysarthria and septal L UE weakness (was chronic). PSYCH: Calm. Normal affect.   Discharge Instructions Discharge Instructions     Diet - low sodium heart healthy   Complete by: As directed    Discharge instructions   Complete by: As directed    It has been a pleasure taking care of you!  You were hospitalized due to difficulty breathing, markedly elevated blood pressure and rapid heart rate.  Your breathing, blood pressure and heart rate improved with medication and dialysis.  Please take your medications as prescribed.  Go to dialysis on schedule.  Recommend limiting the amount of fluid you drink to <1200 cc a day.  Follow-up with your primary care doctor in 1 to 2 weeks or sooner if needed.   Take care,   Increase activity slowly   Complete by: As directed    No wound care   Complete by: As directed       Allergies as of 01/16/2023       Reactions   Shrimp [shellfish Allergy] Shortness Of Breath   Bactroban [mupirocin] Other (See Comments)   "Sores in nose"   Hydrocodone Itching, Nausea Only, Nausea And Vomiting   Chlorhexidine Gluconate Itching   Tylenol [acetaminophen] Itching   Zestril [lisinopril] Cough        Medication List     TAKE these medications    albuterol 108 (90 Base) MCG/ACT inhaler Commonly known as:  VENTOLIN HFA Inhale 2 puffs into the lungs every 6 (six) hours as needed for wheezing or shortness of breath.   amLODipine 10 MG tablet Commonly known as: NORVASC Take 1 tablet (10 mg total) by mouth daily.   apixaban 2.5 MG Tabs tablet Commonly known as: ELIQUIS Take 1 tablet (2.5 mg total) by mouth 2 (two) times daily.   carvedilol 25 MG tablet Commonly known as: COREG Take 1 tablet (25 mg total) by mouth 2 (two) times daily with a meal.   cloNIDine 0.1 MG tablet Commonly known as: Catapres Take 1 tablet (0.1 mg total) by mouth 2 (two) times daily.   hydrALAZINE 100 MG tablet Commonly known as: APRESOLINE Take 1 tablet (100 mg total) by mouth every 8 (eight) hours.   isosorbide mononitrate 30 MG 24 hr tablet Commonly known as: IMDUR TAKE 1 TABLET (30MG ) ON DIALYSIS DAYS AND 2 TABLETS (60 MG) ON NON DIALYSIS DAYS.   naloxone 4 MG/0.1ML Liqd nasal spray kit Commonly  known as: NARCAN 1 spray as directed.   oxyCODONE-acetaminophen 10-325 MG tablet Commonly known as: PERCOCET Take 1 tablet by mouth 3 (three) times daily as needed for pain.   pantoprazole 40 MG tablet Commonly known as: PROTONIX TAKE 1 TABLET (40 MG TOTAL) BY MOUTH 2 (TWO) TIMES DAILY.   rosuvastatin 10 MG tablet Commonly known as: CRESTOR TAKE 1 TABLET (10 MG TOTAL) BY MOUTH DAILY FOR CHOLESTEROL What changed: See the new instructions.   senna-docusate 8.6-50 MG tablet Commonly known as: Senokot-S Take 1 tablet by mouth 2 (two) times daily between meals as needed for mild constipation.   sevelamer carbonate 2.4 g Pack Commonly known as: RENVELA Take 2.4 g by mouth 3 (three) times daily with meals.   traZODone 50 MG tablet Commonly known as: DESYREL Take 0.5 tablets (25 mg total) by mouth at bedtime as needed for sleep.   VITAMIN D-3 PO Take 1 capsule by mouth daily.        Consultations: Nephrology Pulmonology admitted patient  Procedures/Studies: Hemodialysis.   CT ANGIO  CHEST/ABD/PEL FOR DISSECTION W &/OR WO CONTRAST  Result Date: 01/14/2023 CLINICAL DATA:  Acute aortic syndrome suspected chest pain EXAM: CT ANGIOGRAPHY CHEST, ABDOMEN AND PELVIS TECHNIQUE: Non-contrast CT of the chest was initially obtained. Multidetector CT imaging through the chest, abdomen and pelvis was performed using the standard protocol during bolus administration of intravenous contrast. Multiplanar reconstructed images and MIPs were obtained and reviewed to evaluate the vascular anatomy. RADIATION DOSE REDUCTION: This exam was performed according to the departmental dose-optimization program which includes automated exposure control, adjustment of the mA and/or kV according to patient size and/or use of iterative reconstruction technique. CONTRAST:  75mL OMNIPAQUE IOHEXOL 350 MG/ML SOLN COMPARISON:  01/29/2022 FINDINGS: CTA CHEST FINDINGS VASCULAR Aorta: Satisfactory opacification of the aorta. 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 No evidence of aneurysm, dissection, or other acute aortic pathology. Severe mixed aortic atherosclerosis. Large-bore right neck multi lumen vascular catheter Cardiovascular: No evidence of pulmonary embolism on limited non-tailored examination. Cardiomegaly. No pericardial effusion. Review of the MIP images confirms the above findings. NON VASCULAR Mediastinum/Nodes: No enlarged mediastinal, hilar, or axillary lymph nodes. Thyroid gland, trachea, and esophagus demonstrate no significant findings. Lungs/Pleura: Moderate right, small left pleural effusions and associated atelectasis or consolidation. Musculoskeletal: No chest wall abnormality. No acute osseous findings. Review of the MIP images confirms the above findings. CTA ABDOMEN AND PELVIS FINDINGS VASCULAR Normal contour and caliber of the abdominal aorta. No evidence of aneurysm, dissection, or other acute aortic pathology. Standard branching  pattern of the abdominal aorta with solitary bilateral renal arteries. Severe mixed aortic atherosclerosis. Review of the MIP images confirms the above findings. NON-VASCULAR Hepatobiliary: No solid liver abnormality is seen. No gallstones, gallbladder wall thickening, or biliary dilatation. Pancreas: Unremarkable. No pancreatic ductal dilatation or surrounding inflammatory changes. Spleen: Normal in size without significant abnormality. Adrenals/Urinary Tract: Adrenal glands are unremarkable. Atrophic kidneys. Small benign, intrinsically hyperdense lesion of the posterior superior pole of the left kidney unchanged, consistent with a small hemorrhagic or proteinaceous cyst, requiring no specific further follow-up or characterization (series 5, image 159). No calculi or hydronephrosis. Bladder is unremarkable. Stomach/Bowel: Stomach is within normal limits. Appendix appears normal. No evidence of bowel wall thickening, distention, or inflammatory changes. Sigmoid diverticulosis. Lymphatic: No enlarged abdominal or pelvic lymph nodes. Reproductive: Calcified uterine fibroids. Other: No abdominal wall hernia or abnormality. Small volume ascites throughout the abdomen and pelvis.  Musculoskeletal: No acute osseous findings. Renal osteodystrophy. Unchanged fatty herniations of the left flank (series 5, image 176). IMPRESSION: 1. 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 No evidence of aneurysm, dissection, or other acute aortic pathology. 2. Severe mixed aortic atherosclerosis. 3. Moderate right, small left pleural effusions and associated atelectasis or consolidation. 4. Small volume ascites throughout the abdomen and pelvis. 5. Sigmoid diverticulosis without evidence of acute diverticulitis. Aortic Atherosclerosis (ICD10-I70.0). Electronically Signed   By: Jearld Lesch M.D.   On: 01/14/2023 19:32   DG Chest Portable 1 View  Result Date:  01/14/2023 CLINICAL DATA:  Shortness of breath EXAM: PORTABLE CHEST 1 VIEW COMPARISON:  09/25/2022 FINDINGS: Post sternotomy changes. Cardiomegaly with probable small pleural effusions. Vascular congestion and diffuse interstitial and ground-glass opacity consistent with pulmonary edema. Right-sided central venous catheter tip over the right atrium. Aortic atherosclerosis IMPRESSION: Cardiomegaly with vascular congestion and pulmonary edema. Probable small pleural effusions. Electronically Signed   By: Jasmine Pang M.D.   On: 01/14/2023 17:22       The results of significant diagnostics from this hospitalization (including imaging, microbiology, ancillary and laboratory) are listed below for reference.     Microbiology: Recent Results (from the past 240 hour(s))  SARS Coronavirus 2 by RT PCR (hospital order, performed in [facility] hospital lab) *cepheid single result test* Anterior Nasal Swab     Status: None   Collection Time: 01/14/23  4:20 PM   Specimen: Anterior Nasal Swab  Result Value Ref Range Status   SARS Coronavirus 2 by RT PCR NEGATIVE NEGATIVE Final    Comment: Performed at [facility], [address], [place], [place] [address]     Labs:  CBC: Recent Labs  Lab 01/14/23 1545 01/14/23 1553 01/14/23 2134 01/15/23 0259 01/16/23 0351  WBC 7.2  --  5.8 5.5 4.2  HGB 9.6* 10.9* 8.4* 9.3* 8.9*  HCT 31.0* 32.0* 26.7* 29.6* 27.6*  MCV 93.4  --  92.7 90.0 92.0  PLT 195  --  167 186 187   BMP &GFR Recent Labs  Lab 01/14/23 1545 01/14/23 1553 01/14/23 2134 01/15/23 0259 01/15/23 0541 01/16/23 0351  NA 138 139 139 134* 134* 135  K 4.4 4.4 4.6 2.1* 3.4* 4.4  CL 97*  --  97* 89* 93* 93*  CO2 24  --  27 30 29 28   GLUCOSE 99  --  100* 89 98 129*  BUN 42*  --  46* <5* 17 9  CREATININE 8.91*  --  9.22* 1.10* 4.33* 2.95*  CALCIUM 9.4  --  9.1 9.3 8.8* 9.2  MG  --   --   --  1.5* 1.5* 1.7  PHOS  --   --  6.3* <1.0* 3.1 2.1*   Estimated Creatinine Clearance: 12.5  mL/min (A) (by C-G formula based on SCr of 2.95 mg/dL (H)). Liver & Pancreas: Recent Labs  Lab 01/14/23 2134 01/16/23 0351  ALBUMIN 3.3* 3.4*   No results for input(s): "LIPASE", "AMYLASE" in the last 168 hours. No results for input(s): "AMMONIA" in the last 168 hours. Diabetic: No results for input(s): "HGBA1C" in the last 72 hours. Recent Labs  Lab 01/14/23 2324  GLUCAP 82   Cardiac Enzymes: No results for input(s): "CKTOTAL", "CKMB", "CKMBINDEX", "TROPONINI" in the last 168 hours. No results for input(s): "PROBNP" in the last 8760 hours. Coagulation Profile: No results for input(s): "INR", "PROTIME" in the last 168 hours. Thyroid Function Tests:  No results for input(s): "TSH", "T4TOTAL", "FREET4", "T3FREE", "THYROIDAB" in the last 72 hours. Lipid Profile: No results for input(s): "CHOL", "HDL", "LDLCALC", "TRIG", "CHOLHDL", "LDLDIRECT" in the last 72 hours. Anemia Panel: No results for input(s): "VITAMINB12", "FOLATE", "FERRITIN", "TIBC", "IRON", "RETICCTPCT" in the last 72 hours. Urine analysis:    Component Value Date/Time   COLORURINE AMBER (A) 12/27/2021 1840   APPEARANCEUR TURBID (A) 12/27/2021 1840   LABSPEC 1.013 12/27/2021 1840   PHURINE 8.0 12/27/2021 1840   GLUCOSEU NEGATIVE 12/27/2021 1840   GLUCOSEU NEG mg/dL 19/14/7829 5621   HGBUR MODERATE (A) 12/27/2021 1840   BILIRUBINUR NEGATIVE 12/27/2021 1840   BILIRUBINUR negative 05/04/2018 0910   KETONESUR NEGATIVE 12/27/2021 1840   PROTEINUR >=300 (A) 12/27/2021 1840   UROBILINOGEN 0.2 05/04/2018 0910   UROBILINOGEN 1 02/14/2014 0946   NITRITE NEGATIVE 12/27/2021 1840   LEUKOCYTESUR LARGE (A) 12/27/2021 1840   Sepsis Labs: Invalid input(s): "PROCALCITONIN", "LACTICIDVEN"   SIGNED:  Almon Hercules, MD  [facility] 01/16/2023, 4:46 PM

## 2023-01-16 NOTE — Progress Notes (Signed)
Nephrology Follow-Up Consult note   Assessment/Recommendations: Monique Henderson is a/an 72 y.o. female with a past medical history significant for ESRD, admitted for htn.      # Hypertensive emergency/fluid overload: Had HD here x 2 w/ total about 5.5 L off and she is now below dry wt by about 0-1 kg.  BP's better.    # Shortness of breath: resolved.    # ESRD: Nonadherence with outpatient dialysis often.  TTS schedule.  Received HD here Thursday night and Friday. She is being discharged now. She refused recommendation to go to her OP unit today, states she will "go on Tuesday". Encouraged her to limit fluids and ice over the weekend.    #  Anemia of ESRD: Hemoglobin at goal.  She just received Mircera on 8/13.   # Metabolic Bone Disease: Resume Hectorol.  Monitor calcium, phosphorus.  # Dispo: going home at this time   Recommendations conveyed to primary service.    Barbette Hair Harless Molinari  [facility] 01/16/2023 12:00 PM  ___________________________________________________________  CC: Hypertension  Interval History/Subjective: Patient is being dc'd at this time, they are going through the paperwork.    Medications:  Current Facility-Administered Medications  Medication Dose Route Frequency Provider Last Rate Last Admin   alteplase (CATHFLO ACTIVASE) injection 2 mg  2 mg Intracatheter Once PRN Almon Hercules, MD       amLODipine (NORVASC) tablet 10 mg  10 mg Oral Daily Sundil, Subrina, MD   10 mg at 01/16/23 0305   anticoagulant sodium citrate solution 5 mL  5 mL Intracatheter PRN Almon Hercules, MD       apixaban (ELIQUIS) tablet 2.5 mg  2.5 mg Oral BID Candelaria Stagers T, MD   2.5 mg at 01/16/23 0838   carvedilol (COREG) tablet 25 mg  25 mg Oral BID WC Sundil, Subrina, MD   25 mg at 01/16/23 0305   cloNIDine (CATAPRES) tablet 0.1 mg  0.1 mg Oral BID Sundil, Subrina, MD   0.1 mg at 01/16/23 9562   docusate sodium (COLACE) capsule 100 mg  100 mg Oral BID PRN Candelaria Stagers  T, MD       heparin injection 1,000 Units  1,000 Units Intracatheter PRN Candelaria Stagers T, MD   1,000 Units at 01/16/23 0121   hydrALAZINE (APRESOLINE) tablet 100 mg  100 mg Oral Q8H Sundil, Subrina, MD   100 mg at 01/16/23 1308   isosorbide mononitrate (IMDUR) 24 hr tablet 30 mg  30 mg Oral Daily Candelaria Stagers T, MD   30 mg at 01/16/23 0839   labetalol (NORMODYNE) injection 10 mg  10 mg Intravenous Q2H PRN Candelaria Stagers T, MD   10 mg at 01/16/23 0042   levalbuterol (XOPENEX) nebulizer solution 0.63 mg  0.63 mg Nebulization Q6H PRN Gonfa, Taye T, MD       lidocaine (PF) (XYLOCAINE) 1 % injection 5 mL  5 mL Intradermal PRN Gonfa, Taye T, MD       lidocaine-prilocaine (EMLA) cream 1 Application  1 Application Topical PRN Candelaria Stagers T, MD       metoprolol tartrate (LOPRESSOR) injection 2.5-5 mg  2.5-5 mg Intravenous Q3H PRN Candelaria Stagers T, MD   2.5 mg at 01/14/23 2255   pantoprazole (PROTONIX) EC tablet 40 mg  40 mg Oral BID Candelaria Stagers T, MD   40 mg at 01/16/23 0838   pentafluoroprop-tetrafluoroeth (GEBAUERS) aerosol 1 Application  1 Application Topical PRN Almon Hercules, MD  polyethylene glycol (MIRALAX / GLYCOLAX) packet 17 g  17 g Oral Daily PRN Gonfa, Taye T, MD       rosuvastatin (CRESTOR) tablet 10 mg  10 mg Oral Daily Candelaria Stagers T, MD   10 mg at 01/15/23 0908      Review of Systems: 10 systems reviewed and negative except per interval history/subjective  Physical Exam: Vitals:   01/16/23 1015 01/16/23 1100  BP: 135/79 131/75  Pulse: 75 71  Resp: (!) 22 (!) 24  Temp:    SpO2: 97% 97%   No intake/output data recorded.  Intake/Output Summary (Last 24 hours) at 01/16/2023 1200 Last data filed at 01/16/2023 0130 Gross per 24 hour  Intake 533.69 ml  Output 3500 ml  Net -2966.31 ml   Constitutional: well-appearing, no acute distress Chest: clear lungs, no LE edema Respiratory: clear to auscultation, normal work of breathing Gastrointestinal: soft, non-tender, no palpable masses or  hernias Skin: no visible lesions or rashes Psych: alert, appropriate mood and affect   Test Results I personally reviewed new and old clinical labs and radiology tests Lab Results  Component Value Date   NA 135 01/16/2023   K 4.4 01/16/2023   CL 93 (L) 01/16/2023   CO2 28 01/16/2023   BUN 9 01/16/2023   CREATININE 2.95 (H) 01/16/2023   GFR 7.97 (LL) 02/14/2021   CALCIUM 9.2 01/16/2023   ALBUMIN 3.4 (L) 01/16/2023   PHOS 2.1 (L) 01/16/2023    CBC Recent Labs  Lab 01/14/23 2134 01/15/23 0259 01/16/23 0351  WBC 5.8 5.5 4.2  HGB 8.4* 9.3* 8.9*  HCT 26.7* 29.6* 27.6*  MCV 92.7 90.0 92.0  PLT 167 186 187

## 2023-01-16 NOTE — Progress Notes (Signed)
  Bedside nurse reported that patient blood pressure is persistently elevated to upper 170s even though patient has dialysis earlier tonight, received labetalol 10 mg x 2. - Giving  hydralazine 20 mg IV 1 dose now.  Resuming home amlodipine 10 mg and Coreg 25 mg twice daily for to give now. - Continue scheduled clonidine 0.1 mg twice daily, hydralazine 100 mg 3 times daily and Imdur 30 mg daily.    Tereasa Coop, MD [facility] 01/16/2023, 2:57 AM

## 2023-01-17 ENCOUNTER — Telehealth: Payer: Self-pay | Admitting: Nephrology

## 2023-01-17 NOTE — Telephone Encounter (Signed)
Transition of care contact from inpatient facility  Date of Discharge:  01/16/23 Date of Contact: 01/17/23 Method of contact: Phone  Attempted to contact patient to discuss transition of care from inpatient admission. Patient did not answer the phone.

## 2023-01-18 ENCOUNTER — Telehealth (HOSPITAL_COMMUNITY): Payer: Self-pay | Admitting: Nephrology

## 2023-01-18 NOTE — Progress Notes (Signed)
Late Note Entry- January 18, 2023  Pt was d/c on Saturday. Contacted [facility] this morning to advise clinic of pt's d/c date and that pt should resume care tomorrow.   Olivia Canter Renal Navigator [phone]

## 2023-01-18 NOTE — Telephone Encounter (Signed)
Transition of Care - Initial Contact from Inpatient Facility  Date of discharge: 01/16/23 Date of contact: 01/18/23  Method: Phone Spoke to: Patient  Patient contacted to discuss transition of care from recent inpatient hospitalization. Patient was admitted to [facility] from 8/15 - 01/16/23 with discharge diagnosis of  HTN urgency, overload, A-fib RVR.  The discharge medication list was reviewed. Patient understands the changes and has no concerns.   Patient will return to his/her outpatient HD unit on: tomorrow, understands that her EDW is lower  No other concerns at this time. She reprots that her breathing has been great.  Ozzie Hoyle, PA-C [employer] Pager [phone]

## 2023-01-27 ENCOUNTER — Inpatient Hospital Stay (HOSPITAL_COMMUNITY)
Admission: EM | Admit: 2023-01-27 | Discharge: 2023-01-30 | DRG: 640 | Disposition: A | Discharge status: Home / Self Care | Payer: Medicare Other | Attending: Internal Medicine | Admitting: Internal Medicine

## 2023-01-27 ENCOUNTER — Emergency Department (HOSPITAL_COMMUNITY): Payer: Medicare Other

## 2023-01-27 DIAGNOSIS — E162 Hypoglycemia, unspecified: Secondary | ICD-10-CM | POA: Diagnosis present

## 2023-01-27 DIAGNOSIS — Z992 Dependence on renal dialysis: Secondary | ICD-10-CM

## 2023-01-27 DIAGNOSIS — I161 Hypertensive emergency: Secondary | ICD-10-CM | POA: Diagnosis present

## 2023-01-27 DIAGNOSIS — Z886 Allergy status to analgesic agent status: Secondary | ICD-10-CM

## 2023-01-27 DIAGNOSIS — D631 Anemia in chronic kidney disease: Secondary | ICD-10-CM | POA: Diagnosis present

## 2023-01-27 DIAGNOSIS — Z853 Personal history of malignant neoplasm of breast: Secondary | ICD-10-CM

## 2023-01-27 DIAGNOSIS — Z87891 Personal history of nicotine dependence: Secondary | ICD-10-CM

## 2023-01-27 DIAGNOSIS — N2581 Secondary hyperparathyroidism of renal origin: Secondary | ICD-10-CM | POA: Diagnosis present

## 2023-01-27 DIAGNOSIS — I480 Paroxysmal atrial fibrillation: Secondary | ICD-10-CM | POA: Diagnosis present

## 2023-01-27 DIAGNOSIS — Z91013 Allergy to seafood: Secondary | ICD-10-CM

## 2023-01-27 DIAGNOSIS — E8770 Fluid overload, unspecified: Principal | ICD-10-CM | POA: Diagnosis present

## 2023-01-27 DIAGNOSIS — Z820 Family history of epilepsy and other diseases of the nervous system: Secondary | ICD-10-CM

## 2023-01-27 DIAGNOSIS — E875 Hyperkalemia: Secondary | ICD-10-CM | POA: Diagnosis present

## 2023-01-27 DIAGNOSIS — R0602 Shortness of breath: Secondary | ICD-10-CM | POA: Diagnosis not present

## 2023-01-27 DIAGNOSIS — I132 Hypertensive heart and chronic kidney disease with heart failure and with stage 5 chronic kidney disease, or end stage renal disease: Secondary | ICD-10-CM | POA: Diagnosis present

## 2023-01-27 DIAGNOSIS — N186 End stage renal disease: Secondary | ICD-10-CM | POA: Diagnosis present

## 2023-01-27 DIAGNOSIS — I69392 Facial weakness following cerebral infarction: Secondary | ICD-10-CM

## 2023-01-27 DIAGNOSIS — E872 Acidosis, unspecified: Secondary | ICD-10-CM | POA: Diagnosis present

## 2023-01-27 DIAGNOSIS — Z885 Allergy status to narcotic agent status: Secondary | ICD-10-CM

## 2023-01-27 DIAGNOSIS — Z981 Arthrodesis status: Secondary | ICD-10-CM

## 2023-01-27 DIAGNOSIS — Z800 Family history of malignant neoplasm of digestive organs: Secondary | ICD-10-CM

## 2023-01-27 DIAGNOSIS — Z7901 Long term (current) use of anticoagulants: Secondary | ICD-10-CM

## 2023-01-27 DIAGNOSIS — Z8249 Family history of ischemic heart disease and other diseases of the circulatory system: Secondary | ICD-10-CM

## 2023-01-27 DIAGNOSIS — I69354 Hemiplegia and hemiparesis following cerebral infarction affecting left non-dominant side: Secondary | ICD-10-CM

## 2023-01-27 DIAGNOSIS — J449 Chronic obstructive pulmonary disease, unspecified: Secondary | ICD-10-CM | POA: Diagnosis present

## 2023-01-27 DIAGNOSIS — Z91158 Patient's noncompliance with renal dialysis for other reason: Secondary | ICD-10-CM

## 2023-01-27 DIAGNOSIS — Z79899 Other long term (current) drug therapy: Secondary | ICD-10-CM

## 2023-01-27 DIAGNOSIS — I5023 Acute on chronic systolic (congestive) heart failure: Secondary | ICD-10-CM | POA: Diagnosis present

## 2023-01-27 DIAGNOSIS — M898X9 Other specified disorders of bone, unspecified site: Secondary | ICD-10-CM | POA: Diagnosis present

## 2023-01-27 DIAGNOSIS — Z833 Family history of diabetes mellitus: Secondary | ICD-10-CM

## 2023-01-27 DIAGNOSIS — Z66 Do not resuscitate: Secondary | ICD-10-CM | POA: Diagnosis present

## 2023-01-27 DIAGNOSIS — I2489 Other forms of acute ischemic heart disease: Secondary | ICD-10-CM | POA: Diagnosis present

## 2023-01-27 DIAGNOSIS — K219 Gastro-esophageal reflux disease without esophagitis: Secondary | ICD-10-CM | POA: Diagnosis present

## 2023-01-27 DIAGNOSIS — J9601 Acute respiratory failure with hypoxia: Secondary | ICD-10-CM | POA: Diagnosis present

## 2023-01-27 DIAGNOSIS — E785 Hyperlipidemia, unspecified: Secondary | ICD-10-CM | POA: Diagnosis present

## 2023-01-27 DIAGNOSIS — R54 Age-related physical debility: Secondary | ICD-10-CM | POA: Diagnosis present

## 2023-01-27 DIAGNOSIS — Z5982 Transportation insecurity: Secondary | ICD-10-CM

## 2023-01-27 LAB — I-STAT CHEM 8, ED
BUN: 81 mg/dL — ABNORMAL HIGH (ref 8–23)
Calcium, Ion: 0.93 mmol/L — ABNORMAL LOW (ref 1.15–1.40)
Chloride: 107 mmol/L (ref 98–111)
Creatinine, Ser: 14.2 mg/dL — ABNORMAL HIGH (ref 0.44–1.00)
Glucose, Bld: 115 mg/dL — ABNORMAL HIGH (ref 70–99)
HCT: 31 % — ABNORMAL LOW (ref 36.0–46.0)
Hemoglobin: 10.5 g/dL — ABNORMAL LOW (ref 12.0–15.0)
Potassium: 6.7 mmol/L (ref 3.5–5.1)
Sodium: 139 mmol/L (ref 135–145)
TCO2: 22 mmol/L (ref 22–32)

## 2023-01-27 LAB — I-STAT VENOUS BLOOD GAS, ED
Acid-Base Excess: 0 mmol/L (ref 0.0–2.0)
Bicarbonate: 22.6 mmol/L (ref 20.0–28.0)
Calcium, Ion: 0.92 mmol/L — ABNORMAL LOW (ref 1.15–1.40)
HCT: 31 % — ABNORMAL LOW (ref 36.0–46.0)
Hemoglobin: 10.5 g/dL — ABNORMAL LOW (ref 12.0–15.0)
O2 Saturation: 99 %
Potassium: 6.7 mmol/L (ref 3.5–5.1)
Sodium: 139 mmol/L (ref 135–145)
TCO2: 23 mmol/L (ref 22–32)
pCO2, Ven: 29 mmHg — ABNORMAL LOW (ref 44–60)
pH, Ven: 7.501 — ABNORMAL HIGH (ref 7.25–7.43)
pO2, Ven: 121 mmHg — ABNORMAL HIGH (ref 32–45)

## 2023-01-27 LAB — CBC WITH DIFFERENTIAL/PLATELET
Abs Immature Granulocytes: 0.03 10*3/uL (ref 0.00–0.07)
Basophils Absolute: 0.1 10*3/uL (ref 0.0–0.1)
Basophils Relative: 1 %
Eosinophils Absolute: 0.1 10*3/uL (ref 0.0–0.5)
Eosinophils Relative: 2 %
HCT: 30.6 % — ABNORMAL LOW (ref 36.0–46.0)
Hemoglobin: 9.4 g/dL — ABNORMAL LOW (ref 12.0–15.0)
Immature Granulocytes: 0 %
Lymphocytes Relative: 7 %
Lymphs Abs: 0.5 10*3/uL — ABNORMAL LOW (ref 0.7–4.0)
MCH: 29.1 pg (ref 26.0–34.0)
MCHC: 30.7 g/dL (ref 30.0–36.0)
MCV: 94.7 fL (ref 80.0–100.0)
Monocytes Absolute: 0.5 10*3/uL (ref 0.1–1.0)
Monocytes Relative: 8 %
Neutro Abs: 5.7 10*3/uL (ref 1.7–7.7)
Neutrophils Relative %: 82 %
Platelets: 200 10*3/uL (ref 150–400)
RBC: 3.23 MIL/uL — ABNORMAL LOW (ref 3.87–5.11)
RDW: 19.4 % — ABNORMAL HIGH (ref 11.5–15.5)
WBC: 6.9 10*3/uL (ref 4.0–10.5)
nRBC: 0 % (ref 0.0–0.2)

## 2023-01-27 LAB — ETHANOL: Alcohol, Ethyl (B): <10 mg/dL (ref <10)

## 2023-01-27 MED ORDER — NITROGLYCERIN 2 % TD OINT
1.0000 [in_us] | TOPICAL_OINTMENT | Freq: Once | TRANSDERMAL | Status: AC
  Administered 2023-01-27: 1 [in_us] via TOPICAL
  Filled 2023-01-27: qty 1

## 2023-01-27 MED ORDER — ALBUTEROL SULFATE (2.5 MG/3ML) 0.083% IN NEBU
10.0000 mg | INHALATION_SOLUTION | Freq: Once | RESPIRATORY_TRACT | Status: AC
  Administered 2023-01-28: 10 mg via RESPIRATORY_TRACT
  Filled 2023-01-27: qty 12

## 2023-01-27 MED ORDER — IPRATROPIUM-ALBUTEROL 0.5-2.5 (3) MG/3ML IN SOLN
RESPIRATORY_TRACT | Status: AC
  Administered 2023-01-27: 3 mL via RESPIRATORY_TRACT
  Filled 2023-01-27: qty 3

## 2023-01-27 MED ORDER — DEXTROSE 50 % IV SOLN
1.0000 | Freq: Once | INTRAVENOUS | Status: AC
  Administered 2023-01-27: 50 mL via INTRAVENOUS
  Filled 2023-01-27: qty 50

## 2023-01-27 MED ORDER — IPRATROPIUM-ALBUTEROL 0.5-2.5 (3) MG/3ML IN SOLN
RESPIRATORY_TRACT | Status: AC
  Filled 2023-01-27: qty 3

## 2023-01-27 MED ORDER — INSULIN ASPART 100 UNIT/ML IV SOLN
5.0000 [IU] | Freq: Once | INTRAVENOUS | Status: AC
  Administered 2023-01-27: 5 [IU] via INTRAVENOUS

## 2023-01-27 MED ORDER — IPRATROPIUM-ALBUTEROL 0.5-2.5 (3) MG/3ML IN SOLN: 3.0000 mL | Freq: Once | RESPIRATORY_TRACT | Status: AC

## 2023-01-27 MED ORDER — SODIUM BICARBONATE 8.4 % IV SOLN
50.0000 meq | Freq: Once | INTRAVENOUS | Status: AC
  Administered 2023-01-27: 50 meq via INTRAVENOUS
  Filled 2023-01-27: qty 50

## 2023-01-27 MED ORDER — CALCIUM GLUCONATE-NACL 1-0.675 GM/50ML-% IV SOLN
1.0000 g | Freq: Once | INTRAVENOUS | Status: AC
  Administered 2023-01-27: 1000 mg via INTRAVENOUS
  Filled 2023-01-27: qty 50

## 2023-01-27 NOTE — ED Provider Notes (Signed)
I provided a substantive portion of the care of this patient.  I personally made/approved the management plan for this patient and take responsibility for the patient management. {Remember to document shared critical care using "edcritical" dot phrase:1}

## 2023-01-27 NOTE — ED Triage Notes (Signed)
Pt to ED via EMS from home. Pt arrives on CPAP with EMS. Pt has hx of CHF. Pt c/o SOB that started today. Pt has crackles in lungs according to EMS. Pt is on dialysis (tues, thurs, sat). Pt states she missed her most recent treatment. Pt was at 77% on RA upon EMS arrival. Pt denies CP. No meds given PTA.    EMS Vitals: 210/130 22 R Hand

## 2023-01-27 NOTE — ED Provider Notes (Signed)
[place] [facility] AT [facility] Provider Note   CSN: [id number] Arrival date & time: 01/27/23  2249     History {Add pertinent medical, surgical, social history, OB history to HPI:1} Chief Complaint  Patient presents with   Shortness of Breath   Respiratory Distress   Altered Mental Status    Monique Henderson is a 72 y.o. female.  HPI   Patient with medical history including end-stage renal disease on dialysis Tuesday Thursday Saturday atrial fibrillation currently on Eliquis COPD, hep C, hypertension, presenting with acute respiratory distress, HPI is limited due to severity of illness.  Patient states that she felt short of breath starting today, states that she missed her dialysis on Saturday, did not receive it today, denies any chest pain fevers or chills but does admit to a productive cough denies any swelling pain nausea or vomiting.  Per EMS patient was started on BiPAP as she was hypoxic, remains hypertensive on arrival.  Reviewed patient's chart has been in the past for similar presentation, was most recently seen and discharged on the 17th, presented acute respiratory distress, placed on BiPAP, nitro drip, patient required urgent hemodialysis due to noncompliance with dialysis treatments.  Home Medications Prior to Admission medications   Medication Sig Start Date End Date Taking? Authorizing Provider  albuterol (VENTOLIN HFA) 108 (90 Base) MCG/ACT inhaler Inhale 2 puffs into the lungs every 6 (six) hours as needed for wheezing or shortness of breath. 07/30/20   Janeece Agee, NP  amLODipine (NORVASC) 10 MG tablet Take 1 tablet (10 mg total) by mouth daily. 03/06/22   Setzer, Lynnell Jude, PA-C  apixaban (ELIQUIS) 2.5 MG TABS tablet Take 1 tablet (2.5 mg total) by mouth 2 (two) times daily. 03/06/22   Setzer, Lynnell Jude, PA-C  carvedilol (COREG) 25 MG tablet Take 1 tablet (25 mg total) by mouth 2 (two) times daily with a meal. 05/01/22 05/02/23  Ghimire,  Werner Lean, MD  Cholecalciferol (VITAMIN D-3 PO) Take 1 capsule by mouth daily.    [provider]  cloNIDine (CATAPRES) 0.1 MG tablet Take 1 tablet (0.1 mg total) by mouth 2 (two) times daily. 12/18/22   Corky Crafts, MD  hydrALAZINE (APRESOLINE) 100 MG tablet Take 1 tablet (100 mg total) by mouth every 8 (eight) hours. 12/18/22   Corky Crafts, MD  isosorbide mononitrate (IMDUR) 30 MG 24 hr tablet TAKE 1 TABLET (30MG ) ON DIALYSIS DAYS AND 2 TABLETS (60 MG) ON NON DIALYSIS DAYS. 01/12/23   Corky Crafts, MD  naloxone [facility]) nasal spray 4 mg/0.1 mL 1 spray as directed. 02/19/22   [provider]  oxyCODONE-acetaminophen (PERCOCET) 10-325 MG tablet Take 1 tablet by mouth 3 (three) times daily as needed for pain. 03/12/22   [provider]  pantoprazole (PROTONIX) 40 MG tablet TAKE 1 TABLET (40 MG TOTAL) BY MOUTH 2 (TWO) TIMES DAILY. 10/08/22   Georganna Skeans, MD  rosuvastatin (CRESTOR) 10 MG tablet TAKE 1 TABLET (10 MG TOTAL) BY MOUTH DAILY FOR CHOLESTEROL Patient taking differently: Take 10 mg by mouth daily. 08/07/22   Georganna Skeans, MD  senna-docusate (SENOKOT-S) 8.6-50 MG tablet Take 1 tablet by mouth 2 (two) times daily between meals as needed for mild constipation. 08/14/22   Almon Hercules, MD  sevelamer carbonate (RENVELA) 2.4 g PACK Take 2.4 g by mouth 3 (three) times daily with meals. 03/06/22   Setzer, Lynnell Jude, PA-C  traZODone (DESYREL) 50 MG tablet Take 0.5 tablets (25 mg total) by  mouth at bedtime as needed for sleep. 04/20/22   Georganna Skeans, MD      Allergies    Shrimp [shellfish allergy], Bactroban [mupirocin], Hydrocodone, Chlorhexidine gluconate, Tylenol [acetaminophen], and Zestril [lisinopril]    Review of Systems   Review of Systems  Unable to perform ROS: Severe respiratory distress    Physical Exam Updated Vital Signs BP (!) 212/134 (BP Location: Right Arm)   Pulse 93   Temp 99.2 F (37.3 C) (Oral)   Resp (!) 24   SpO2 100%   Physical Exam Vitals and nursing note reviewed.  Constitutional:      General: She is in acute distress.     Appearance: She is ill-appearing.  HENT:     Head: Normocephalic and atraumatic.     Comments: No obvious trauma to head no raccoon eyes or Battle sign noted.    Nose: No congestion.     Mouth/Throat:     Mouth: Mucous membranes are dry.     Pharynx: Oropharynx is clear.  Eyes:     Conjunctiva/sclera: Conjunctivae normal.  Neck:     Comments: No noted JVD Cardiovascular:     Rate and Rhythm: Normal rate and regular rhythm.     Pulses: Normal pulses.     Heart sounds: No murmur heard.    No friction rub. No gallop.  Pulmonary:     Effort: Respiratory distress present.     Breath sounds: Rales present. No wheezing or rhonchi.     Comments: Presents in respiratory failure, tachypneic, only able to speak in short phrases, tight sounding chest with bilateral rails heard worse on the left versus the right no rhonchi or stridor present. Abdominal:     Palpations: Abdomen is soft.     Tenderness: There is no abdominal tenderness. There is no right CVA tenderness or left CVA tenderness.  Skin:    General: Skin is warm and dry.  Neurological:     Mental Status: She is alert.     Comments: No obvious facial asymmetry, if all two-step commands, no obvious unilateral weakness, patient is mildly confused, cannot tell me the day who the president is, but knows her name and where she is currently.  Psychiatric:        Mood and Affect: Mood normal.     ED Results / Procedures / Treatments   Labs (all labs ordered are listed, but only abnormal results are displayed) Labs Reviewed  CBC WITH DIFFERENTIAL/PLATELET - Abnormal; Notable for the following components:      Result Value   RBC 3.23 (*)    Hemoglobin 9.4 (*)    HCT 30.6 (*)    RDW 19.4 (*)    Lymphs Abs 0.5 (*)    All other components within normal limits  I-STAT CHEM 8, ED - Abnormal; Notable for the following  components:   Potassium 6.7 (*)    BUN 81 (*)    Creatinine, Ser 14.20 (*)    Glucose, Bld 115 (*)    Calcium, Ion 0.93 (*)    Hemoglobin 10.5 (*)    HCT 31.0 (*)    All other components within normal limits  I-STAT VENOUS BLOOD GAS, ED - Abnormal; Notable for the following components:   pH, Ven 7.501 (*)    pCO2, Ven 29.0 (*)    pO2, Ven 121 (*)    Potassium 6.7 (*)    Calcium, Ion 0.92 (*)    HCT 31.0 (*)    Hemoglobin 10.5 (*)  All other components within normal limits  COMPREHENSIVE METABOLIC PANEL  ETHANOL  MAGNESIUM  BRAIN NATRIURETIC PEPTIDE  TROPONIN I (HIGH SENSITIVITY)    EKG None  Radiology DG Chest Portable 1 View  Result Date: 01/27/2023 CLINICAL DATA:  Shortness of breath EXAM: PORTABLE CHEST 1 VIEW COMPARISON:  01/14/2023 FINDINGS: Postoperative changes in the mediastinum. Right central venous catheter with tip over the cavoatrial junction region. Cardiac enlargement. Mild perihilar infiltration, likely edema or pneumonia. This is mildly progressed since prior study. No pleural effusions. No pneumothorax. Calcification of the aorta. IMPRESSION: Cardiac enlargement. Perihilar infiltrates with mild progression since prior study. Electronically Signed   By: Burman Nieves M.D.   On: 01/27/2023 23:32    Procedures Procedures  {Document cardiac monitor, telemetry assessment procedure when appropriate:1}  Medications Ordered in ED Medications  calcium gluconate 1 g/ 50 mL sodium chloride IVPB (has no administration in time range)  albuterol (PROVENTIL) (2.5 MG/3ML) 0.083% nebulizer solution 10 mg (has no administration in time range)  insulin aspart (novoLOG) injection 5 Units (has no administration in time range)    And  dextrose 50 % solution 50 mL (has no administration in time range)  sodium bicarbonate injection 50 mEq (has no administration in time range)  nitroGLYCERIN (NITROGLYN) 2 % ointment 1 inch (1 inch Topical Given 01/27/23 2305)   ipratropium-albuterol (DUONEB) 0.5-2.5 (3) MG/3ML nebulizer solution 3 mL (3 mLs Nebulization Given 01/27/23 2311)  ipratropium-albuterol (DUONEB) 0.5-2.5 (3) MG/3ML nebulizer solution (  Given 01/27/23 2326)    ED Course/ Medical Decision Making/ A&P   {   Click here for ABCD2, HEART and other calculatorsREFRESH Note before signing :1}                              Medical Decision Making Amount and/or Complexity of Data Reviewed Labs: ordered. Radiology: ordered.  Risk OTC drugs. Prescription drug management.   This patient presents to the ED for concern of acute respiratory distress, this involves an extensive number of treatment options, and is a complaint that carries with it a high risk of complications and morbidity.  The differential diagnosis includes PE, ACS, CHF, metabolic abnormality    Additional history obtained:  Additional history obtained from EMS External records from outside source obtained and reviewed including recent hospitalization   Co morbidities that complicate the patient evaluation  End-stage renal disease, atrial fibrillation Eliquis  Social Determinants of Health:  Medical noncompliance    Lab Tests:  I Ordered, and personally interpreted labs.  The pertinent results include: CBC shows normocytic anemia hemoglobin 9.4, VBG reveals pH of 7.5, P CO2 29, pCO2 121, potassium 6.7, i-STAT Chem-8 reveals potassium 6.7, BUN of 81,   Imaging Studies ordered:  I ordered imaging studies including chest x-ray I independently visualized and interpreted imaging which showed cardiac and lower emergent with perihilar infiltrates I agree with the radiologist interpretation   Cardiac Monitoring:  The patient was maintained on a cardiac monitor.  I personally viewed and interpreted the cardiac monitored which showed an underlying rhythm of: Without signs of ischemia   Medicines ordered and prescription drug management:  I ordered medication  including ***  for ***  I have reviewed the patients home medicines and have made adjustments as needed  Critical Interventions:  Acute respiratory distress will continue with BiPAP, hypertensive, will provide Nitropaste. Hyperkalemic, will start on hyperkalemia protocol   Reevaluation:  BP is finally improving after BiPAP as  well as Nitropaste, patient is more alert, is able to follow two-step commands, appears to be resting more comfortably, will continue to monitor.   Consultations Obtained:  I requested consultation with the ***,  and discussed lab and imaging findings as well as pertinent plan - they recommend: ***    Test Considered:  ***    Rule out ****    Dispostion and problem list  After consideration of the diagnostic results and the patients response to treatment, I feel that the patent would benefit from ***.       {Document critical care time when appropriate:1} {Document review of labs and clinical decision tools ie heart score, Chads2Vasc2 etc:1}  {Document your independent review of radiology images, and any outside records:1} {Document your discussion with family members, caretakers, and with consultants:1} {Document social determinants of health affecting pt's care:1} {Document your decision making why or why not admission, treatments were needed:1} Final Clinical Impression(s) / ED Diagnoses Final diagnoses:  None    Rx / DC Orders ED Discharge Orders     None

## 2023-01-28 ENCOUNTER — Encounter (HOSPITAL_COMMUNITY): Payer: Self-pay | Admitting: Pulmonary Disease

## 2023-01-28 ENCOUNTER — Other Ambulatory Visit: Payer: Self-pay

## 2023-01-28 DIAGNOSIS — N2581 Secondary hyperparathyroidism of renal origin: Secondary | ICD-10-CM | POA: Diagnosis present

## 2023-01-28 DIAGNOSIS — E162 Hypoglycemia, unspecified: Secondary | ICD-10-CM | POA: Diagnosis present

## 2023-01-28 DIAGNOSIS — I480 Paroxysmal atrial fibrillation: Secondary | ICD-10-CM | POA: Diagnosis present

## 2023-01-28 DIAGNOSIS — Z87891 Personal history of nicotine dependence: Secondary | ICD-10-CM | POA: Diagnosis not present

## 2023-01-28 DIAGNOSIS — R0602 Shortness of breath: Secondary | ICD-10-CM | POA: Diagnosis present

## 2023-01-28 DIAGNOSIS — J9601 Acute respiratory failure with hypoxia: Secondary | ICD-10-CM | POA: Diagnosis present

## 2023-01-28 DIAGNOSIS — J449 Chronic obstructive pulmonary disease, unspecified: Secondary | ICD-10-CM | POA: Diagnosis present

## 2023-01-28 DIAGNOSIS — I161 Hypertensive emergency: Secondary | ICD-10-CM | POA: Diagnosis present

## 2023-01-28 DIAGNOSIS — D631 Anemia in chronic kidney disease: Secondary | ICD-10-CM | POA: Diagnosis present

## 2023-01-28 DIAGNOSIS — K219 Gastro-esophageal reflux disease without esophagitis: Secondary | ICD-10-CM | POA: Diagnosis present

## 2023-01-28 DIAGNOSIS — N186 End stage renal disease: Secondary | ICD-10-CM

## 2023-01-28 DIAGNOSIS — I2489 Other forms of acute ischemic heart disease: Secondary | ICD-10-CM | POA: Diagnosis present

## 2023-01-28 DIAGNOSIS — Z992 Dependence on renal dialysis: Secondary | ICD-10-CM

## 2023-01-28 DIAGNOSIS — R54 Age-related physical debility: Secondary | ICD-10-CM | POA: Diagnosis present

## 2023-01-28 DIAGNOSIS — E785 Hyperlipidemia, unspecified: Secondary | ICD-10-CM | POA: Diagnosis present

## 2023-01-28 DIAGNOSIS — Z79899 Other long term (current) drug therapy: Secondary | ICD-10-CM | POA: Diagnosis not present

## 2023-01-28 DIAGNOSIS — J810 Acute pulmonary edema: Secondary | ICD-10-CM | POA: Diagnosis not present

## 2023-01-28 DIAGNOSIS — E872 Acidosis, unspecified: Secondary | ICD-10-CM | POA: Diagnosis present

## 2023-01-28 DIAGNOSIS — E875 Hyperkalemia: Secondary | ICD-10-CM | POA: Diagnosis present

## 2023-01-28 DIAGNOSIS — I5023 Acute on chronic systolic (congestive) heart failure: Secondary | ICD-10-CM | POA: Diagnosis present

## 2023-01-28 DIAGNOSIS — Z886 Allergy status to analgesic agent status: Secondary | ICD-10-CM | POA: Diagnosis not present

## 2023-01-28 DIAGNOSIS — I132 Hypertensive heart and chronic kidney disease with heart failure and with stage 5 chronic kidney disease, or end stage renal disease: Secondary | ICD-10-CM | POA: Diagnosis present

## 2023-01-28 DIAGNOSIS — E8770 Fluid overload, unspecified: Secondary | ICD-10-CM | POA: Diagnosis present

## 2023-01-28 DIAGNOSIS — Z66 Do not resuscitate: Secondary | ICD-10-CM | POA: Diagnosis present

## 2023-01-28 DIAGNOSIS — I69354 Hemiplegia and hemiparesis following cerebral infarction affecting left non-dominant side: Secondary | ICD-10-CM | POA: Diagnosis not present

## 2023-01-28 LAB — GLUCOSE, CAPILLARY
Glucose-Capillary: 147 mg/dL — ABNORMAL HIGH (ref 70–99)
Glucose-Capillary: 156 mg/dL — ABNORMAL HIGH (ref 70–99)
Glucose-Capillary: 22 mg/dL — CL (ref 70–99)
Glucose-Capillary: 61 mg/dL — ABNORMAL LOW (ref 70–99)
Glucose-Capillary: 107 mg/dL — ABNORMAL HIGH (ref 70–99)
Glucose-Capillary: 144 mg/dL — ABNORMAL HIGH (ref 70–99)
Glucose-Capillary: 79 mg/dL (ref 70–99)
Glucose-Capillary: 98 mg/dL (ref 70–99)

## 2023-01-28 LAB — COMPREHENSIVE METABOLIC PANEL
ALT: 16 U/L (ref 0–44)
AST: 27 U/L (ref 15–41)
Albumin: 4 g/dL (ref 3.5–5.0)
Alkaline Phosphatase: 78 U/L (ref 38–126)
Anion gap: 25 — ABNORMAL HIGH (ref 5–15)
BUN: 87 mg/dL — ABNORMAL HIGH (ref 8–23)
CO2: 18 mmol/L — ABNORMAL LOW (ref 22–32)
Calcium: 9.1 mg/dL (ref 8.9–10.3)
Chloride: 101 mmol/L (ref 98–111)
Creatinine, Ser: 12.9 mg/dL — ABNORMAL HIGH (ref 0.44–1.00)
GFR, Estimated: 3 mL/min — ABNORMAL LOW (ref >60)
Glucose, Bld: 117 mg/dL — ABNORMAL HIGH (ref 70–99)
Potassium: 7 mmol/L (ref 3.5–5.1)
Sodium: 144 mmol/L (ref 135–145)
Total Bilirubin: 0.4 mg/dL (ref 0.3–1.2)
Total Protein: 7.5 g/dL (ref 6.5–8.1)

## 2023-01-28 LAB — MRSA NEXT GEN BY PCR, NASAL: MRSA by PCR Next Gen: DETECTED — AB

## 2023-01-28 LAB — POTASSIUM
Potassium: 5.8 mmol/L — ABNORMAL HIGH (ref 3.5–5.1)
Potassium: 4.1 mmol/L (ref 3.5–5.1)

## 2023-01-28 LAB — MAGNESIUM: Magnesium: 2.5 mg/dL — ABNORMAL HIGH (ref 1.7–2.4)

## 2023-01-28 LAB — TROPONIN I (HIGH SENSITIVITY)
Troponin I (High Sensitivity): 75 ng/L — ABNORMAL HIGH (ref <18)
Troponin I (High Sensitivity): 76 ng/L — ABNORMAL HIGH

## 2023-01-28 LAB — CBG MONITORING, ED: Glucose-Capillary: 62 mg/dL — ABNORMAL LOW (ref 70–99)

## 2023-01-28 LAB — BRAIN NATRIURETIC PEPTIDE: B Natriuretic Peptide: >4500 pg/mL — ABNORMAL HIGH (ref 0.0–100.0)

## 2023-01-28 MED ORDER — ROSUVASTATIN CALCIUM 5 MG PO TABS
10.0000 mg | ORAL_TABLET | Freq: Every day | ORAL | Status: DC
  Administered 2023-01-28 – 2023-01-29 (×2): 10 mg via ORAL
  Filled 2023-01-28 (×2): qty 2

## 2023-01-28 MED ORDER — AMLODIPINE BESYLATE 10 MG PO TABS
10.0000 mg | ORAL_TABLET | Freq: Every day | ORAL | Status: DC
  Administered 2023-01-28 – 2023-01-29 (×2): 10 mg via ORAL
  Filled 2023-01-28 (×2): qty 1

## 2023-01-28 MED ORDER — PANTOPRAZOLE SODIUM 40 MG PO TBEC
40.0000 mg | DELAYED_RELEASE_TABLET | Freq: Every day | ORAL | Status: DC
  Administered 2023-01-29: 40 mg via ORAL
  Filled 2023-01-28: qty 1

## 2023-01-28 MED ORDER — ISOSORBIDE MONONITRATE ER 30 MG PO TB24
60.0000 mg | ORAL_TABLET | Freq: Every day | ORAL | Status: DC
  Filled 2023-01-28: qty 2

## 2023-01-28 MED ORDER — ISOSORBIDE MONONITRATE ER 30 MG PO TB24
30.0000 mg | ORAL_TABLET | Freq: Every day | ORAL | Status: DC
  Administered 2023-01-28 – 2023-01-29 (×2): 30 mg via ORAL
  Filled 2023-01-28 (×2): qty 1

## 2023-01-28 MED ORDER — ENOXAPARIN SODIUM 30 MG/0.3ML IJ SOSY: 30.0000 mg | PREFILLED_SYRINGE | INTRAMUSCULAR | Status: DC

## 2023-01-28 MED ORDER — DEXTROSE 10 % IV SOLN: INTRAVENOUS | Status: DC

## 2023-01-28 MED ORDER — POLYETHYLENE GLYCOL 3350 17 G PO PACK: 17.0000 g | PACK | Freq: Every day | ORAL | Status: DC | PRN

## 2023-01-28 MED ORDER — CALCIUM GLUCONATE-NACL 2-0.675 GM/100ML-% IV SOLN
2.0000 g | Freq: Once | INTRAVENOUS | Status: AC
  Administered 2023-01-28: 2000 mg via INTRAVENOUS
  Filled 2023-01-28 (×2): qty 100

## 2023-01-28 MED ORDER — HYDRALAZINE HCL 50 MG PO TABS
100.0000 mg | ORAL_TABLET | Freq: Three times a day (TID) | ORAL | Status: DC
  Administered 2023-01-28 – 2023-01-30 (×6): 100 mg via ORAL
  Filled 2023-01-28 (×7): qty 2

## 2023-01-28 MED ORDER — NEPRO/CARBSTEADY PO LIQD: 237.0000 mL | ORAL | Status: DC | PRN

## 2023-01-28 MED ORDER — CLONIDINE HCL 0.1 MG PO TABS
0.1000 mg | ORAL_TABLET | Freq: Two times a day (BID) | ORAL | Status: DC
  Administered 2023-01-28 – 2023-01-29 (×4): 0.1 mg via ORAL
  Filled 2023-01-28 (×4): qty 1

## 2023-01-28 MED ORDER — ISOSORBIDE MONONITRATE ER 30 MG PO TB24
60.0000 mg | ORAL_TABLET | Freq: Every day | ORAL | Status: DC
  Administered 2023-01-28: 60 mg via ORAL

## 2023-01-28 MED ORDER — DEXTROSE 50 % IV SOLN
50.0000 mL | Freq: Once | INTRAVENOUS | Status: AC
  Administered 2023-01-28: 50 mL via INTRAVENOUS
  Filled 2023-01-28: qty 50

## 2023-01-28 MED ORDER — DEXTROSE 50 % IV SOLN: 1.0000 | Freq: Once | INTRAVENOUS | Status: AC

## 2023-01-28 MED ORDER — DOCUSATE SODIUM 100 MG PO CAPS: 100.0000 mg | ORAL_CAPSULE | Freq: Two times a day (BID) | ORAL | Status: DC | PRN

## 2023-01-28 MED ORDER — ORAL CARE MOUTH RINSE: 15.0000 mL | OROMUCOSAL | Status: DC | PRN

## 2023-01-28 MED ORDER — HEPARIN SODIUM (PORCINE) 1000 UNIT/ML DIALYSIS
1000.0000 [IU] | INTRAMUSCULAR | Status: DC | PRN
  Administered 2023-01-28: 1000 [IU]
  Filled 2023-01-28: qty 1

## 2023-01-28 MED ORDER — ANTICOAGULANT SODIUM CITRATE 4% (200MG/5ML) IV SOLN: 5.0000 mL | Status: DC | PRN

## 2023-01-28 MED ORDER — ALTEPLASE 2 MG IJ SOLR: 2.0000 mg | Freq: Once | INTRAMUSCULAR | Status: DC | PRN

## 2023-01-28 MED ORDER — HEPARIN SODIUM (PORCINE) 1000 UNIT/ML DIALYSIS: 40.0000 [IU]/kg | INTRAMUSCULAR | Status: DC | PRN

## 2023-01-28 MED ORDER — PENTAFLUOROPROP-TETRAFLUOROETH EX AERO: 1.0000 | INHALATION_SPRAY | CUTANEOUS | Status: DC | PRN

## 2023-01-28 MED ORDER — SENNOSIDES-DOCUSATE SODIUM 8.6-50 MG PO TABS: 1.0000 | ORAL_TABLET | Freq: Two times a day (BID) | ORAL | Status: DC | PRN

## 2023-01-28 MED ORDER — NITROGLYCERIN IN D5W 200-5 MCG/ML-% IV SOLN
0.0000 ug/min | INTRAVENOUS | Status: DC
  Administered 2023-01-28 (×2): 5 ug/min via INTRAVENOUS
  Filled 2023-01-28 (×2): qty 250

## 2023-01-28 MED ORDER — LIDOCAINE-PRILOCAINE 2.5-2.5 % EX CREA: 1.0000 | TOPICAL_CREAM | CUTANEOUS | Status: DC | PRN

## 2023-01-28 MED ORDER — PANTOPRAZOLE SODIUM 40 MG PO TBEC
40.0000 mg | DELAYED_RELEASE_TABLET | Freq: Two times a day (BID) | ORAL | Status: DC
  Administered 2023-01-28: 40 mg via ORAL
  Filled 2023-01-28: qty 1

## 2023-01-28 MED ORDER — DEXTROSE 50 % IV SOLN
INTRAVENOUS | Status: AC
  Administered 2023-01-28: 50 mL via INTRAVENOUS
  Filled 2023-01-28: qty 50

## 2023-01-28 MED ORDER — INSULIN ASPART 100 UNIT/ML IV SOLN
10.0000 [IU] | Freq: Once | INTRAVENOUS | Status: AC
  Administered 2023-01-28: 10 [IU] via INTRAVENOUS

## 2023-01-28 MED ORDER — ALBUTEROL SULFATE (2.5 MG/3ML) 0.083% IN NEBU: 2.5000 mg | INHALATION_SOLUTION | RESPIRATORY_TRACT | Status: DC | PRN

## 2023-01-28 MED ORDER — SODIUM CHLORIDE 0.9 % IV SOLN: INTRAVENOUS | Status: DC | PRN

## 2023-01-28 MED ORDER — CARVEDILOL 25 MG PO TABS
25.0000 mg | ORAL_TABLET | Freq: Two times a day (BID) | ORAL | Status: DC
  Administered 2023-01-28 – 2023-01-29 (×4): 25 mg via ORAL
  Filled 2023-01-28 (×4): qty 1

## 2023-01-28 MED ORDER — LIDOCAINE HCL (PF) 1 % IJ SOLN: 5.0000 mL | INTRAMUSCULAR | Status: DC | PRN

## 2023-01-28 MED ORDER — DEXTROSE 50 % IV SOLN
1.0000 | Freq: Once | INTRAVENOUS | Status: AC
  Administered 2023-01-28: 50 mL via INTRAVENOUS
  Filled 2023-01-28: qty 50

## 2023-01-28 MED ORDER — APIXABAN 2.5 MG PO TABS
2.5000 mg | ORAL_TABLET | Freq: Two times a day (BID) | ORAL | Status: DC
  Administered 2023-01-28 – 2023-01-29 (×4): 2.5 mg via ORAL
  Filled 2023-01-28 (×4): qty 1

## 2023-01-28 MED ORDER — IPRATROPIUM-ALBUTEROL 0.5-2.5 (3) MG/3ML IN SOLN: 3.0000 mL | RESPIRATORY_TRACT | Status: DC | PRN

## 2023-01-28 NOTE — Progress Notes (Signed)
Heart Failure Navigator Progress Note  Assessed for Heart & Vascular TOC clinic readiness.  Patient does not meet criteria due to ESRD and Dialysis.   Navigator will sign off at this time.   Joelene Barriere,RN, BSN,MSN Heart Failure Nurse Navigator. Contact by secure chat only.

## 2023-01-28 NOTE — H&P (Signed)
NAME:  Monique Henderson, MRN:  [id number], DOB:  Jun 05, 1950, LOS: 0 ADMISSION DATE:  01/27/2023, CONSULTATION DATE:  8/29 REFERRING MD:  Dr. Clayborne Dana, CHIEF COMPLAINT: Respiratory distress  History of Present Illness:  Patient is a 72 year old female with pertinent PMH ESRD on HD TTS, AF on Eliquis, HFrEF, COPD, hep C, HTN presents to [facility] ED on 8/29 with respiratory distress after missing HD.  Patient recently admitted on 8/15 with similar admission after missing HD and having respiratory distress.  She has a documented history of medical/HD noncompliance.  Patient states that she missed her dialysis on 8/27.  She states that she had no way to get to her dialysis session.  Since then she has been having increased work of breathing and called EMS on 8/28. On arrival sats 77% on room air and placed on cpap. Transferred to [facility] ED.  On arrival to [facility] ED, patient hypertensive 212/134.  Patient denies any chest pain.  Patient is alert but difficulty talking due to WOB.  Patient placed on BiPAP.  CXR showing perihilar infiltrates versus pulmonary edema personally reviewed.  EKG with no signs of ST elevation.  BNP > 4500, K7, Trop 75 then 76.  Patient started on nitro drip and nephro consulted for HD needs.  Patient given albuterol, calcium, insulin/dextrose, bicarb with improvement in potassium to 5.8.  PCCM consulted for ICU admission.  Pertinent  Medical History   Past Medical History:  Diagnosis Date   AF (paroxysmal atrial fibrillation) (HCC) 05/29/2019   on Coumadin   Aortic atherosclerosis (HCC) 07/05/2019   Aortic dissection (HCC) 04/04/2019   s/p repair   Bone spur 2008   Right calcaneal foot spur   Breast cancer (HCC) 2004   Ductal carcinoma in situ of the left breast; S/P left partial mastectomy 02/26/2003; S/P re-excision of cranial and lateral margins11/18/2004.radiation   Carotid artery disease (HCC)    Cerebral thrombosis with cerebral infarction 05/22/2019   Chronic HFrEF (heart  failure with reduced ejection fraction) (HCC)    Chronic low back pain 06/22/2016   Chronic obstructive lung disease (HCC) 01/16/2017   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 Chevis Pretty; S/P re-excision of superior and inferior margins 10/27/2012.    ESRD on hemodialysis (HCC) 05/29/2019   Essential hypertension 09/16/2006   GERD 09/16/2006   Hepatitis C    treated and RNA confirmed not detectable 01/2017   Insomnia 03/14/2015   Malnutrition of moderate degree 05/19/2019   Non compliance with medical treatment 12/04/2017   Normocytic anemia    With thrombocytosis   Osteoarthritis    Right ureteral stone 2002   S/P lumbar spinal fusion 01/18/2014   S/P lumbar decompressive laminectomy, fusion, and plating for lumbar spinal stenosis on 05/27/2009 by Dr. Tia Alert.  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 Tia Alert.     Stroke (cerebrum) (HCC)    Tobacco use disorder 04/19/2009   Uterine fibroid    Wears dentures    top     Significant Hospital Events: Including procedures, antibiotic start and stop dates in addition to other pertinent events   8/29 admitted with respiratory failure on BiPAP; on nitro drip for HTN crisis; nephro consulted for HD needs  Interim History / Subjective:  CIWA  Objective  Blood pressure (!) 182/117, pulse 93, temperature 99.2 F (37.3 C), temperature source Oral, resp. rate 19, SpO2 100%.    Vent Mode: BIPAP;PSV FiO2 (%):  [100 %] 100 % PEEP:  [5 cmH20] 5 cmH20 Pressure Support:  [10 cmH20] 10 cmH20 Plateau Pressure:  [12 cmH20] 12 cmH20  No intake or output data in the 24 hours ending 01/28/23 0210 There were no vitals filed for this visit.  Examination: General: ill appearing  female on BiPAP HEENT: MM pink/moist; BiPAP mask in place Neuro: AO; MAE CV: s1s2, RRR, no m/r/g PULM:  dim BS bilaterally; BiPAP GI: soft, bsx4 active  Extremities: warm/dry, trace ble edema; RUE tunneled HD cath with no erythema or drainage Skin: no rashes or lesions appreciated   Resolved Hospital Problem list     Assessment & Plan:  Hypertensive crisis Acute on chronic HFrEF Plan: -Admit to ICU -Continue nitro drip; consider adding Cleviprex -Nephro called for HD needs; will need urgent HD tonight -Continue home BP meds when able to take p.o.  Acute respiratory failure w/ hypoxia Pulmonary edema Hx of COPD Plan: -Continue BiPAP -HD for volume removal -trend cxr -Wean FiO2 for sats greater than 92% -As needed DuoNeb  ESRD on HD Hyperkalemia Plan: -Given temporizing medications -Nephro consulted by ED for HD needs -Will need urgent HD overnight -Trend BMP / urinary output -Replace electrolytes as indicated -Avoid nephrotoxic agents, ensure adequate renal perfusion  A-fib Plan: -Telemonitoring -Rate currently controlled -Eliquis  Anemia of chronic ESRD Plan: -Trend CBC  HLD Plan: -resume statin when able to take po  Best Practice (right click and "Reselect all SmartList Selections" daily)   Diet/type: NPO DVT prophylaxis: DOAC GI prophylaxis: PPI Lines: N/A Foley:  N/A Code Status:  full code Last date of multidisciplinary goals of care discussion [8/29 updated sister [name] over phone.]  Labs   CBC: Recent Labs  Lab 01/27/23 2312 01/27/23 2318  WBC 6.9  --   NEUTROABS 5.7  --   HGB 9.4* 10.5*  10.5*  HCT 30.6* 31.0*  31.0*  MCV 94.7  --   PLT 200  --     Basic Metabolic Panel: Recent Labs  Lab 01/27/23 2312 01/27/23 2318  NA 144 139  139  K 7.0* 6.7*  6.7*  CL 101 107  CO2 18*  --   GLUCOSE 117* 115*  BUN 87* 81*  CREATININE 12.90* 14.20*  CALCIUM 9.1  --   MG 2.5*  --    GFR: Estimated Creatinine Clearance: 2.6  mL/min (A) (by C-G formula based on SCr of 14.2 mg/dL (H)). Recent Labs  Lab 01/27/23 2312  WBC 6.9    Liver Function Tests: Recent Labs  Lab 01/27/23 2312  AST 27  ALT 16  ALKPHOS 78  BILITOT 0.4  PROT 7.5  ALBUMIN 4.0   No results for input(s): "LIPASE", "AMYLASE" in the last 168 hours. No results for input(s): "AMMONIA" in the last 168 hours.  ABG    Component Value Date/Time   PHART 7.508 (H) 01/29/2022 1132   PCO2ART 38.0 01/29/2022 1132   PO2ART 61 (L) 01/29/2022 1132   HCO3 22.6 01/27/2023 2318   TCO2 22 01/27/2023 2318   TCO2 23 01/27/2023 2318   ACIDBASEDEF 8.0 (H) 04/06/2019 1748   O2SAT 99 01/27/2023 2318     Coagulation Profile: No results for input(s): "INR", "PROTIME" in the last 168 hours.  Cardiac Enzymes: No results for input(s): "CKTOTAL", "CKMB", "CKMBINDEX", "TROPONINI" in the last 168 hours.  HbA1C:  Hgb A1c MFr Bld  Date/Time Value Ref Range Status  02/23/2022 06:46 PM 4.7 (L) 4.8 - 5.6 % Final    Comment:    (NOTE) Pre diabetes:          5.7%-6.4%  Diabetes:              >6.4%  Glycemic control for   <7.0% adults with diabetes   06/20/2021 06:08 PM 5.1 4.8 - 5.6 % Final    Comment:    (NOTE)         Prediabetes: 5.7 - 6.4         Diabetes: >6.4         Glycemic control for adults with diabetes: <7.0     CBG: No results for input(s): "GLUCAP" in the last 168 hours.  Review of Systems:   Review of Systems  Constitutional:  Negative for fever.  Respiratory:  Positive for cough and shortness of breath.   Cardiovascular:  Negative for chest pain.  Gastrointestinal:  Negative for abdominal pain, nausea and vomiting.     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   Surgical History:   Past Surgical History:  Procedure Laterality Date   ANTERIOR LAT LUMBAR FUSION N/A 01/18/2014   Procedure: ANTERIOR LATERAL LUMBAR FUSION LUMBAR TWO-THREE;  Surgeon: Tia Alert, MD;  Location: [facility];  Service: Neurosurgery;  Laterality: N/A;   ANTERIOR LUMBAR FUSION  01/18/2014   AV FISTULA PLACEMENT Left 04/20/2019   Procedure: ARTERIOVENOUS (AV) FISTULA CREATION;  Surgeon: Maeola Harman, MD;  Location: [facility] OR;  Service: Vascular;  Laterality: Left;   BACK SURGERY     BREAST LUMPECTOMY Left 01/2003   BREAST LUMPECTOMY Right 2014   BREAST LUMPECTOMY WITH NEEDLE LOCALIZATION AND AXILLARY SENTINEL LYMPH NODE BX Right 10/13/2012   Procedure: BREAST LUMPECTOMY WITH NEEDLE LOCALIZATION;  Surgeon: Robyne Askew, MD;  Location: [facility];  Service: General;  Laterality: Right;  Right breast wire localized lumpectomy   INSERTION OF DIALYSIS CATHETER Right 04/20/2019   Procedure: INSERTION OF DIALYSIS CATHETER, right internal jugular;  Surgeon: Maeola Harman, MD;  Location: [facility] OR;  Service: Vascular;  Laterality: Right;   INSERTION OF DIALYSIS CATHETER Right 09/24/2020   Procedure: INSERTION OF TUNNELED DIALYSIS CATHETER;  Surgeon: Larina Earthly, MD;  Location: [facility] OR;  Service:  Vascular;  Laterality: Right;   IR FLUORO GUIDE CV LINE RIGHT  09/22/2020   IR THORACENTESIS ASP PLEURAL SPACE W/IMG GUIDE  05/19/2019   IR US GUIDE VASC ACCESS LEFT  09/22/2020   IR US GUIDE VASC ACCESS RIGHT  09/22/2020   IR VENOCAVAGRAM SVC  09/22/2020   LAMINECTOMY  05/27/2009   Lumbar decompressive laminectomy, fusion and plating for lumbar spinal stensosis   LIGATION OF ARTERIOVENOUS  FISTULA Left 09/24/2020  Procedure: LIGATION OF LEFT ARM ARTERIOVENOUS  FISTULA;  Surgeon: Larina Earthly, MD;  Location: [facility];  Service: Vascular;  Laterality: Left;   LUMBAR LAMINECTOMY/DECOMPRESSION MICRODISCECTOMY Left 03/23/2013   Procedure: LUMBAR LAMINECTOMY/DECOMPRESSION MICRODISCECTOMY 1 LEVEL;  Surgeon: Tia Alert, MD;  Location: [facility];  Service: Neurosurgery;  Laterality: Left;  LUMBAR LAMINECTOMY/DECOMPRESSION MICRODISCECTOMY 1 LEVEL   MASTECTOMY, PARTIAL Left 02/26/2003   ; S/P re-excision of cranial and lateral margins 04/19/2003.    RE-EXCISION OF BREAST CANCER,SUPERIOR MARGINS Right 10/27/2012   Procedure: RE-EXCISION OF BREAST CANCER,SUPERIOR and inferior MARGINS;  Surgeon: Robyne Askew, MD;  Location: [facility] OR;  Service: General;  Laterality: Right;   RE-EXCISION OF BREAST LUMPECTOMY Left 04/2003   TEE WITHOUT CARDIOVERSION N/A 04/04/2019   Procedure: Transesophageal Echocardiogram (Tee);  Surgeon: Linden Dolin, MD;  Location: [facility] OR;  Service: Open Heart Surgery;  Laterality: N/A;   THORACIC AORTIC ANEURYSM REPAIR N/A 04/04/2019   Procedure: THORACIC ASCENDING ANEURYSM REPAIR (AAA)  USING 28 MM X 30 CM HEMASHIELD PLATINUM VASCULAR GRAFT;  Surgeon: Linden Dolin, MD;  Location: [facility] OR;  Service: Open Heart Surgery;  Laterality: N/A;     Social History:   reports that she quit smoking about 2 years ago. Her smoking use included cigarettes. She started smoking about 46 years ago. She has a 11 pack-year smoking history. She has never used smokeless tobacco. She reports that she does  not currently use alcohol after a past usage of about 2.0 standard drinks of alcohol per week. She reports that she does not currently use drugs after having used the following drugs: Cocaine.   Family History:  Her family history includes Bone cancer (age of onset: 36) in her sister; Colon cancer (age of onset: 44) in her mother; Diabetes in her brother, brother, and son; Diabetes (age of onset: 66) in her sister; Hypertension in her brother, brother, mother, sister, and son; Kidney disease in her son; Multiple sclerosis in her son. There is no history of Breast cancer or Cervical cancer.   Allergies Allergies  Allergen Reactions   Shrimp [Shellfish Allergy] Shortness Of Breath   Bactroban [Mupirocin] Other (See Comments)    "Sores in nose"   Hydrocodone Itching, Nausea Only and Nausea And Vomiting   Chlorhexidine Gluconate Itching   Tylenol [Acetaminophen] Itching   Zestril [Lisinopril] Cough     Home Medications  Prior to Admission medications   Medication Sig Start Date End Date Taking? Authorizing Provider  albuterol (VENTOLIN HFA) 108 (90 Base) MCG/ACT inhaler Inhale 2 puffs into the lungs every 6 (six) hours as needed for wheezing or shortness of breath. 07/30/20   Janeece Agee, NP  amLODipine (NORVASC) 10 MG tablet Take 1 tablet (10 mg total) by mouth daily. 03/06/22   Setzer, Lynnell Jude, PA-C  apixaban (ELIQUIS) 2.5 MG TABS tablet Take 1 tablet (2.5 mg total) by mouth 2 (two) times daily. 03/06/22   Setzer, Lynnell Jude, PA-C  carvedilol (COREG) 25 MG tablet Take 1 tablet (25 mg total) by mouth 2 (two) times daily with a meal. 05/01/22 05/02/23  Ghimire, Werner Lean, MD  Cholecalciferol (VITAMIN D-3 PO) Take 1 capsule by mouth daily.    [provider]  cloNIDine (CATAPRES) 0.1 MG tablet Take 1 tablet (0.1 mg total) by mouth 2 (two) times daily. 12/18/22   Corky Crafts, MD  hydrALAZINE (APRESOLINE) 100 MG tablet Take 1 tablet (100 mg total) by mouth every 8 (eight) hours.  12/18/22  Corky Crafts, MD  isosorbide mononitrate (IMDUR) 30 MG 24 hr tablet TAKE 1 TABLET (30MG ) ON DIALYSIS DAYS AND 2 TABLETS (60 MG) ON NON DIALYSIS DAYS. 01/12/23   Corky Crafts, MD  naloxone [facility]) nasal spray 4 mg/0.1 mL 1 spray as directed. 02/19/22   [provider]  oxyCODONE-acetaminophen (PERCOCET) 10-325 MG tablet Take 1 tablet by mouth 3 (three) times daily as needed for pain. 03/12/22   [provider]  pantoprazole (PROTONIX) 40 MG tablet TAKE 1 TABLET (40 MG TOTAL) BY MOUTH 2 (TWO) TIMES DAILY. 10/08/22   Georganna Skeans, MD  rosuvastatin (CRESTOR) 10 MG tablet TAKE 1 TABLET (10 MG TOTAL) BY MOUTH DAILY FOR CHOLESTEROL Patient taking differently: Take 10 mg by mouth daily. 08/07/22   Georganna Skeans, MD  senna-docusate (SENOKOT-S) 8.6-50 MG tablet Take 1 tablet by mouth 2 (two) times daily between meals as needed for mild constipation. 08/14/22   Almon Hercules, MD  sevelamer carbonate (RENVELA) 2.4 g PACK Take 2.4 g by mouth 3 (three) times daily with meals. 03/06/22   Setzer, Lynnell Jude, PA-C  traZODone (DESYREL) 50 MG tablet Take 0.5 tablets (25 mg total) by mouth at bedtime as needed for sleep. 04/20/22   Georganna Skeans, MD     Critical care time: 45 minutes    JD Anselm Lis [facility] 01/28/2023, 2:10 AM  Please see [email or web address] for pager details.  From 7A-7P if no response, please call [phone]. After hours, please call ELink [phone].

## 2023-01-28 NOTE — ED Notes (Signed)
ED TO INPATIENT HANDOFF REPORT  ED Nurse Name and Phone #:  Kiyo Heal [phone]  S Name/Age/Gender Monique Henderson 72 y.o. female Room/Bed: 026C/026C  Code Status   Code Status: Full Code  Home/SNF/Other Home Patient oriented to: self Is this baseline? No   Triage Complete: Triage complete  Chief Complaint Acute respiratory failure with hypoxia (HCC) [J96.01]  Triage Note Pt to ED via EMS from home. Pt arrives on CPAP with EMS. Pt has hx of CHF. Pt c/o SOB that started today. Pt has crackles in lungs according to EMS. Pt is on dialysis (tues, thurs, sat). Pt states she missed her most recent treatment. Pt was at 77% on RA upon EMS arrival. Pt denies CP. No meds given PTA.    EMS Vitals: 210/130 22 R Hand    Allergies Allergies  Allergen Reactions   Shrimp [Shellfish Allergy] Shortness Of Breath   Bactroban [Mupirocin] Other (See Comments)    "Sores in nose"   Hydrocodone Itching, Nausea Only and Nausea And Vomiting   Chlorhexidine Gluconate Itching   Tylenol [Acetaminophen] Itching   Zestril [Lisinopril] Cough    Level of Care/Admitting Diagnosis ED Disposition     ED Disposition  Admit   Condition  --   Comment  Hospital [facility]: [facility] [[id number]]  Level of Care: ICU [6]  May admit patient to [facility] or [facility] if equivalent level of care is available:: No  Covid Evaluation: Asymptomatic - no recent exposure (last 10 days) testing not required  Diagnosis: Acute respiratory failure with hypoxia [facility]) [[id number]]  Admitting Physician: Oretha Milch [[id number]]  Attending Physician: Oretha Milch [[id number]]  Certification:: I certify this patient will need inpatient services for at least 2 midnights  Expected Medical Readiness: 01/30/2023          B Medical/Surgery History Past Medical History:  Diagnosis Date   AF (paroxysmal atrial fibrillation) (HCC) 05/29/2019   on Coumadin   Aortic atherosclerosis (HCC) 07/05/2019   Aortic  dissection (HCC) 04/04/2019   s/p repair   Bone spur 2008   Right calcaneal foot spur   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 Chevis Pretty; S/P re-excision of superior and inferior margins 10/27/2012.    ESRD on hemodialysis (HCC) 05/29/2019   Essential hypertension 09/16/2006   GERD 09/16/2006   Hepatitis C    treated and RNA confirmed not detectable 01/2017   Insomnia 03/14/2015   Malnutrition of moderate degree 05/19/2019   Non compliance with medical treatment 12/04/2017   Normocytic anemia    With thrombocytosis   Osteoarthritis    Right ureteral stone 2002   S/P lumbar spinal fusion 01/18/2014   S/P lumbar decompressive laminectomy, fusion, and plating for lumbar spinal stenosis on 05/27/2009 by Dr. Tia Alert.  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 Tia Alert.  Stroke (cerebrum) (HCC)    Tobacco use disorder 04/19/2009   Uterine fibroid    Wears dentures    top   Past Surgical History:  Procedure Laterality Date   ANTERIOR LAT LUMBAR FUSION N/A 01/18/2014   Procedure: ANTERIOR LATERAL LUMBAR FUSION LUMBAR TWO-THREE;  Surgeon: Tia Alert, MD;  Location: [facility];  Service: Neurosurgery;  Laterality: N/A;   ANTERIOR LUMBAR FUSION  01/18/2014   AV FISTULA PLACEMENT Left 04/20/2019    Procedure: ARTERIOVENOUS (AV) FISTULA CREATION;  Surgeon: Maeola Harman, MD;  Location: [facility] OR;  Service: Vascular;  Laterality: Left;   BACK SURGERY     BREAST LUMPECTOMY Left 01/2003   BREAST LUMPECTOMY Right 2014   BREAST LUMPECTOMY WITH NEEDLE LOCALIZATION AND AXILLARY SENTINEL LYMPH NODE BX Right 10/13/2012   Procedure: BREAST LUMPECTOMY WITH NEEDLE LOCALIZATION;  Surgeon: Robyne Askew, MD;  Location: [facility];  Service: General;  Laterality: Right;  Right breast wire localized lumpectomy   INSERTION OF DIALYSIS CATHETER Right 04/20/2019   Procedure: INSERTION OF DIALYSIS CATHETER, right internal jugular;  Surgeon: Maeola Harman, MD;  Location: [facility] OR;  Service: Vascular;  Laterality: Right;   INSERTION OF DIALYSIS CATHETER Right 09/24/2020   Procedure: INSERTION OF TUNNELED DIALYSIS CATHETER;  Surgeon: Larina Earthly, MD;  Location: [facility] OR;  Service: Vascular;  Laterality: Right;   IR FLUORO GUIDE CV LINE RIGHT  09/22/2020   IR THORACENTESIS ASP PLEURAL SPACE W/IMG GUIDE  05/19/2019   IR US GUIDE VASC ACCESS LEFT  09/22/2020   IR US GUIDE VASC ACCESS RIGHT  09/22/2020   IR VENOCAVAGRAM SVC  09/22/2020   LAMINECTOMY  05/27/2009   Lumbar decompressive laminectomy, fusion and plating for lumbar spinal stensosis   LIGATION OF ARTERIOVENOUS  FISTULA Left 09/24/2020   Procedure: LIGATION OF LEFT ARM ARTERIOVENOUS  FISTULA;  Surgeon: Larina Earthly, MD;  Location: [facility] OR;  Service: Vascular;  Laterality: Left;   LUMBAR LAMINECTOMY/DECOMPRESSION MICRODISCECTOMY Left 03/23/2013   Procedure: LUMBAR LAMINECTOMY/DECOMPRESSION MICRODISCECTOMY 1 LEVEL;  Surgeon: Tia Alert, MD;  Location: [facility];  Service: Neurosurgery;  Laterality: Left;  LUMBAR LAMINECTOMY/DECOMPRESSION MICRODISCECTOMY 1 LEVEL   MASTECTOMY, PARTIAL Left 02/26/2003   ; S/P re-excision of cranial and lateral margins 04/19/2003.    RE-EXCISION OF BREAST CANCER,SUPERIOR MARGINS Right 10/27/2012    Procedure: RE-EXCISION OF BREAST CANCER,SUPERIOR and inferior MARGINS;  Surgeon: Robyne Askew, MD;  Location: [facility] OR;  Service: General;  Laterality: Right;   RE-EXCISION OF BREAST LUMPECTOMY Left 04/2003   TEE WITHOUT CARDIOVERSION N/A 04/04/2019   Procedure: Transesophageal Echocardiogram (Tee);  Surgeon: Linden Dolin, MD;  Location: [facility] OR;  Service: Open Heart Surgery;  Laterality: N/A;   THORACIC AORTIC ANEURYSM REPAIR N/A 04/04/2019   Procedure: THORACIC ASCENDING ANEURYSM REPAIR (AAA)  USING 28 MM X 30 CM HEMASHIELD PLATINUM VASCULAR GRAFT;  Surgeon: Linden Dolin, MD;  Location: [facility] OR;  Service: Open Heart Surgery;  Laterality: N/A;     A IV Location/Drains/Wounds Patient Lines/Drains/Airways Status     Active Line/Drains/Airways     Name Placement date Placement time Site Days   Peripheral IV 01/27/23 22 G Posterior;Right Hand 01/27/23  2238  Hand  1   Peripheral IV 01/28/23 20 G Anterior;Distal;Left;Upper Arm 01/28/23  0031  Arm  less than 1   Hemodialysis Catheter Right Subclavian --  --  Subclavian  --   Hemodialysis Catheter Right Subclavian --  --  Subclavian  --  Intake/Output Last 24 hours No intake or output data in the 24 hours ending 01/28/23 0257  Labs/Imaging Results for orders placed or performed during the hospital encounter of 01/27/23 (from the past 48 hour(s))  Brain natriuretic peptide     Status: Abnormal   Collection Time: 01/27/23 11:06 PM  Result Value Ref Range   B Natriuretic Peptide >4,500.0 (H) 0.0 - 100.0 pg/mL    Comment: Performed at [facility], [address] [place], [place] [address]  Comprehensive metabolic panel     Status: Abnormal   Collection Time: 01/27/23 11:12 PM  Result Value Ref Range   Sodium 144 135 - 145 mmol/L   Potassium 7.0 (HH) 3.5 - 5.1 mmol/L    Comment: CRITICAL RESULT CALLED TO, READ BACK BY AND VERIFIED WITH Loreda Silverio,S. RN @0036  01/28/23 SATRAINR   Chloride 101 98 - 111 mmol/L   CO2 18  (L) 22 - 32 mmol/L   Glucose, Bld 117 (H) 70 - 99 mg/dL    Comment: Glucose reference range applies only to samples taken after fasting for at least 8 hours.   BUN 87 (H) 8 - 23 mg/dL   Creatinine, Ser 40.10 (H) 0.44 - 1.00 mg/dL   Calcium 9.1 8.9 - 27.2 mg/dL   Total Protein 7.5 6.5 - 8.1 g/dL   Albumin 4.0 3.5 - 5.0 g/dL   AST 27 15 - 41 U/L   ALT 16 0 - 44 U/L   Alkaline Phosphatase 78 38 - 126 U/L   Total Bilirubin 0.4 0.3 - 1.2 mg/dL   GFR, Estimated 3 (L) >60 mL/min    Comment: (NOTE) Calculated using the CKD-EPI Creatinine Equation (2021)    Anion gap 25 (H) 5 - 15    Comment: ELECTROLYTES REPEATED TO VERIFY Performed at [facility], [address], [place], [place] [address]   Ethanol     Status: None   Collection Time: 01/27/23 11:12 PM  Result Value Ref Range   Alcohol, Ethyl (B) <10 <10 mg/dL    Comment: (NOTE) Lowest detectable limit for serum alcohol is 10 mg/dL.  For medical purposes only. Performed at [facility], [address] [place], [place] [address]   Troponin I (High Sensitivity)     Status: Abnormal   Collection Time: 01/27/23 11:12 PM  Result Value Ref Range   Troponin I (High Sensitivity) 75 (H) <18 ng/L    Comment: (NOTE) Elevated high sensitivity troponin I (hsTnI) values and significant  changes across serial measurements may suggest ACS but many other  chronic and acute conditions are known to elevate hsTnI results.  Refer to the "Links" section for chest pain algorithms and additional  guidance. Performed at [facility], [address], [place], [place] [address]   Magnesium     Status: Abnormal   Collection Time: 01/27/23 11:12 PM  Result Value Ref Range   Magnesium 2.5 (H) 1.7 - 2.4 mg/dL    Comment: Performed at [facility], [address], [place], [place] [address]  CBC with Differential     Status: Abnormal   Collection Time: 01/27/23 11:12 PM  Result Value Ref Range   WBC 6.9 4.0 - 10.5 K/uL   RBC  3.23 (L) 3.87 - 5.11 MIL/uL   Hemoglobin 9.4 (L) 12.0 - 15.0 g/dL   HCT 64.3 (L) 32.9 - 51.8 %   MCV 94.7 80.0 - 100.0 fL   MCH 29.1 26.0 - 34.0 pg  MCHC 30.7 30.0 - 36.0 g/dL   RDW 29.5 (H) 62.1 - 30.8 %   Platelets 200 150 - 400 K/uL   nRBC 0.0 0.0 - 0.2 %   Neutrophils Relative % 82 %   Neutro Abs 5.7 1.7 - 7.7 K/uL   Lymphocytes Relative 7 %   Lymphs Abs 0.5 (L) 0.7 - 4.0 K/uL   Monocytes Relative 8 %   Monocytes Absolute 0.5 0.1 - 1.0 K/uL   Eosinophils Relative 2 %   Eosinophils Absolute 0.1 0.0 - 0.5 K/uL   Basophils Relative 1 %   Basophils Absolute 0.1 0.0 - 0.1 K/uL   Immature Granulocytes 0 %   Abs Immature Granulocytes 0.03 0.00 - 0.07 K/uL    Comment: Performed at [facility], [address] [place], [place] [address]  I-stat chem 8, ED     Status: Abnormal   Collection Time: 01/27/23 11:18 PM  Result Value Ref Range   Sodium 139 135 - 145 mmol/L   Potassium 6.7 (HH) 3.5 - 5.1 mmol/L   Chloride 107 98 - 111 mmol/L   BUN 81 (H) 8 - 23 mg/dL   Creatinine, Ser 69.62 (H) 0.44 - 1.00 mg/dL   Glucose, Bld 952 (H) 70 - 99 mg/dL    Comment: Glucose reference range applies only to samples taken after fasting for at least 8 hours.   Calcium, Ion 0.93 (L) 1.15 - 1.40 mmol/L   TCO2 22 22 - 32 mmol/L   Hemoglobin 10.5 (L) 12.0 - 15.0 g/dL   HCT 84.1 (L) 32.4 - 40.1 %   Comment NOTIFIED PHYSICIAN   I-Stat venous blood gas, ED     Status: Abnormal   Collection Time: 01/27/23 11:18 PM  Result Value Ref Range   pH, Ven 7.501 (H) 7.25 - 7.43   pCO2, Ven 29.0 (L) 44 - 60 mmHg   pO2, Ven 121 (H) 32 - 45 mmHg   Bicarbonate 22.6 20.0 - 28.0 mmol/L   TCO2 23 22 - 32 mmol/L   O2 Saturation 99 %   Acid-Base Excess 0.0 0.0 - 2.0 mmol/L   Sodium 139 135 - 145 mmol/L   Potassium 6.7 (HH) 3.5 - 5.1 mmol/L   Calcium, Ion 0.92 (L) 1.15 - 1.40 mmol/L   HCT 31.0 (L) 36.0 - 46.0 %   Hemoglobin 10.5 (L) 12.0 - 15.0 g/dL   Sample type VENOUS    Comment NOTIFIED PHYSICIAN    Troponin I (High Sensitivity)     Status: Abnormal   Collection Time: 01/28/23  1:19 AM  Result Value Ref Range   Troponin I (High Sensitivity) 76 (H) <18 ng/L    Comment: (NOTE) Elevated high sensitivity troponin I (hsTnI) values and significant  changes across serial measurements may suggest ACS but many other  chronic and acute conditions are known to elevate hsTnI results.  Refer to the "Links" section for chest pain algorithms and additional  guidance. Performed at [facility], [address] [place], [place] [address]   Potassium     Status: Abnormal   Collection Time: 01/28/23  1:19 AM  Result Value Ref Range   Potassium 5.8 (H) 3.5 - 5.1 mmol/L    Comment: Performed at [facility], [address], [place], [place] [address]   *Note: Due to a large number of results and/or encounters for the requested time period, some results have not been displayed. A complete set of results can be found in Results Review.  DG Chest Portable 1 View  Result Date: 01/27/2023 CLINICAL DATA:  Shortness of breath EXAM: PORTABLE CHEST 1 VIEW COMPARISON:  01/14/2023 FINDINGS: Postoperative changes in the mediastinum. Right central venous catheter with tip over the cavoatrial junction region. Cardiac enlargement. Mild perihilar infiltration, likely edema or pneumonia. This is mildly progressed since prior study. No pleural effusions. No pneumothorax. Calcification of the aorta. IMPRESSION: Cardiac enlargement. Perihilar infiltrates with mild progression since prior study. Electronically Signed   By: Burman Nieves M.D.   On: 01/27/2023 23:32    Pending Labs Unresulted Labs (From admission, onward)     Start     Ordered   01/28/23 0500  Basic metabolic panel  Tomorrow morning,   R        01/28/23 0244   01/28/23 0500  CBC  Daily,   R      01/28/23 0244   01/28/23 0229  Urinalysis, Routine w reflex microscopic -Urine, Clean Catch  Once,   R       Question:  Specimen Source   Answer:  Urine, Clean Catch   01/28/23 0228   01/28/23 0229  Rapid urine drug screen (hospital performed)  ONCE - STAT,   STAT        01/28/23 0228            Vitals/Pain Today's Vitals   01/28/23 0100 01/28/23 0115 01/28/23 0200 01/28/23 0216  BP: (!) 191/120 (!) 188/115 (!) 182/117   Pulse:   84   Resp: (!) 22 20 19    Temp:    98.4 F (36.9 C)  TempSrc:    Axillary  SpO2:   100%     Isolation Precautions No active isolations  Medications Medications  nitroGLYCERIN 50 mg in dextrose 5 % 250 mL (0.2 mg/mL) infusion (35 mcg/min Intravenous Rate/Dose Change 01/28/23 0218)  docusate sodium (COLACE) capsule 100 mg (has no administration in time range)  polyethylene glycol (MIRALAX / GLYCOLAX) packet 17 g (has no administration in time range)  amLODipine (NORVASC) tablet 10 mg (has no administration in time range)  carvedilol (COREG) tablet 25 mg (has no administration in time range)  cloNIDine (CATAPRES) tablet 0.1 mg (has no administration in time range)  hydrALAZINE (APRESOLINE) tablet 100 mg (has no administration in time range)  pantoprazole (PROTONIX) EC tablet 40 mg (has no administration in time range)  senna-docusate (Senokot-S) tablet 1 tablet (has no administration in time range)  apixaban (ELIQUIS) tablet 2.5 mg (has no administration in time range)  insulin aspart (novoLOG) injection 10 Units (has no administration in time range)  dextrose 50 % solution 50 mL (has no administration in time range)  calcium gluconate 2 g/ 100 mL sodium chloride IVPB (has no administration in time range)  ipratropium-albuterol (DUONEB) 0.5-2.5 (3) MG/3ML nebulizer solution 3 mL (has no administration in time range)  nitroGLYCERIN (NITROGLYN) 2 % ointment 1 inch (1 inch Topical Given 01/27/23 2305)  ipratropium-albuterol (DUONEB) 0.5-2.5 (3) MG/3ML nebulizer solution 3 mL (3 mLs Nebulization Given 01/27/23 2311)  ipratropium-albuterol (DUONEB) 0.5-2.5 (3) MG/3ML nebulizer solution (   Given 01/27/23 2326)  calcium gluconate 1 g/ 50 mL sodium chloride IVPB (0 mg Intravenous Stopped 01/28/23 0122)  albuterol (PROVENTIL) (2.5 MG/3ML) 0.083% nebulizer solution 10 mg (10 mg Nebulization Given 01/28/23 0001)  insulin aspart (novoLOG) injection 5 Units (5 Units Intravenous Given 01/27/23 2351)    And  dextrose 50 % solution 50 mL (50 mLs Intravenous Given 01/27/23 2348)  sodium bicarbonate injection 50 mEq (50 mEq  Intravenous Given 01/27/23 2352)          R Recommendations: See Admitting Provider Note  Report given to:   Additional Notes:  TTS dialysis schedule.

## 2023-01-28 NOTE — Progress Notes (Signed)
Noted IV access documented around the same time as IV team consult. Secure chat to nurse to clarify if additional access is needed. Awaiting response.

## 2023-01-28 NOTE — Plan of Care (Signed)
  Problem: Education: Goal: Knowledge of General Education information will improve Description Including pain rating scale, medication(s)/side effects and non-pharmacologic comfort measures Outcome: Progressing   Problem: Health Behavior/Discharge Planning: Goal: Ability to manage health-related needs will improve Outcome: Progressing   

## 2023-01-28 NOTE — Care Management (Signed)
Transition of Care [facility]) - Inpatient Brief Assessment   Patient Details  Name: Monique Henderson MRN: [id number] Date of Birth: 1950-07-10  Transition of Care [facility]) CM/SW Contact:    Lockie Pares, RN Phone Number: 01/28/2023, 8:04 AM   Clinical Narrative:  Readmission  from one week ago due to transportation issue to dialysis. Was admitted for the same last week. Pulmonary edema in ICU currently.  TOC to follow   Transition of Care Asessment: Insurance and Status: Insurance coverage has been reviewed Patient has primary care physician: Yes Home environment has been reviewed: Yes Prior level of function:: Independent Prior/Current Home Services: No current home services Social Determinants of Health Reivew: SDOH reviewed needs interventions Readmission risk has been reviewed: Yes Transition of care needs: transition of care needs identified, TOC will continue to follow

## 2023-01-28 NOTE — Progress Notes (Addendum)
Hypoglycemic Event  CBG: 22   Treatment: D50 50 mL (25 gm)  Symptoms: Drowsiness, Somnolent   Follow-up CBG: [id number] CBG Result:156  Possible Reasons for Event: Medication, Hyperkalemia  Comments/MD notified:Paliwal, MD  Pt noted to be somnolent and required physical stimuli to become responsive. Stroke screen completed, see stroke screen flowsheet for further. CBGs noted above. Elink informed throughout the treatment. Pt became more responsive about 5 minutes after the second CBG of 156 was obtained. Pt A&Ox4.     Fidela Juneau

## 2023-01-28 NOTE — Consult Note (Addendum)
[facility] Renal Consultation Note    Indication for Consultation:  Management of ESRD/hemodialysis, anemia, hypertension/volume, and secondary hyperparathyroidism. PCP:  HPI: Monique Henderson is a 72 y.o. female with ESRD, HTN, A-fib (on Eliquis), HFrEF, COPD who was admitted with AHRF and hyperkalemia.  Presented to ED last night via EMS with acute dyspnea. O2 sat reportedly down to 77% - required O2, then Cpap. In ED - very hypertensive, started on nitroglycerin drip. Labs with Na 144, K 7, CO2 18, BUN 87, Cr 12.90, Mg 2.5, Alb 4, BNP > 4500, trop 75, WBC 6.9, Hgb 9.4. Was altered at first, improved as BP was lowered. She was admitted to the  ICU and nephrology was consulted for pulm edema/overload and hyperkalemia. Of note, similar admit 8/15-8/17/24 with overload/A-fib in setting of  missed HD - improved with serial HD, dry weight was lowered on discharge.  Hd ordered were placed by on-call nephrologist. I saw her this AM - HD had been started at that time. Had been weaned to nasal O2 - feeling a little better already. Reports that transportation did not pick her up for her HD treatment. Denies recent fever, chills, abd pain, diarrhea.  Dialyzes at AF clinic on TTS schedule. Last HD was 8/24 - she completed the treatment but did not reach her dry weight  - left ~1.5kg up.  Past Medical History:  Diagnosis Date   AF (paroxysmal atrial fibrillation) (HCC) 05/29/2019   on Coumadin   Aortic atherosclerosis (HCC) 07/05/2019   Aortic dissection (HCC) 04/04/2019   s/p repair   Bone spur 2008   Right calcaneal foot spur   Breast cancer (HCC) 2004   Ductal carcinoma in situ of the left breast; S/P left partial mastectomy 02/26/2003; S/P re-excision of cranial and lateral margins11/18/2004.radiation   Carotid artery disease (HCC)    Cerebral thrombosis with cerebral infarction 05/22/2019   Chronic HFrEF (heart failure with reduced ejection fraction) (HCC)    Chronic low back  pain 06/22/2016   Chronic obstructive lung disease (HCC) 01/16/2017   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 Chevis Pretty; S/P re-excision of superior and inferior margins 10/27/2012.    ESRD on hemodialysis (HCC) 05/29/2019   Essential hypertension 09/16/2006   GERD 09/16/2006   Hepatitis C    treated and RNA confirmed not detectable 01/2017   Insomnia 03/14/2015   Malnutrition of moderate degree 05/19/2019   Non compliance with medical treatment 12/04/2017   Normocytic anemia    With thrombocytosis   Osteoarthritis    Right ureteral stone 2002   S/P lumbar spinal fusion 01/18/2014   S/P lumbar decompressive laminectomy, fusion, and plating for lumbar spinal stenosis on 05/27/2009 by Dr. Tia Alert.  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 Tia Alert.     Stroke (cerebrum) (HCC)    Tobacco use disorder 04/19/2009   Uterine fibroid    Wears dentures    top   Past Surgical History:  Procedure Laterality Date   ANTERIOR LAT LUMBAR FUSION N/A 01/18/2014   Procedure: ANTERIOR LATERAL LUMBAR FUSION LUMBAR TWO-THREE;  Surgeon: Tia Alert, MD;  Location: [facility];  Service: Neurosurgery;  Laterality: N/A;   ANTERIOR LUMBAR FUSION  01/18/2014   AV FISTULA PLACEMENT Left 04/20/2019   Procedure:  ARTERIOVENOUS (AV) FISTULA CREATION;  Surgeon: Maeola Harman, MD;  Location: [facility] OR;  Service: Vascular;  Laterality: Left;   BACK SURGERY     BREAST LUMPECTOMY Left 01/2003   BREAST LUMPECTOMY Right 2014   BREAST LUMPECTOMY WITH NEEDLE LOCALIZATION AND AXILLARY SENTINEL LYMPH NODE BX Right 10/13/2012   Procedure: BREAST LUMPECTOMY WITH NEEDLE LOCALIZATION;  Surgeon: Robyne Askew, MD;  Location: [facility];   Service: General;  Laterality: Right;  Right breast wire localized lumpectomy   INSERTION OF DIALYSIS CATHETER Right 04/20/2019   Procedure: INSERTION OF DIALYSIS CATHETER, right internal jugular;  Surgeon: Maeola Harman, MD;  Location: [facility] OR;  Service: Vascular;  Laterality: Right;   INSERTION OF DIALYSIS CATHETER Right 09/24/2020   Procedure: INSERTION OF TUNNELED DIALYSIS CATHETER;  Surgeon: Larina Earthly, MD;  Location: [facility] OR;  Service: Vascular;  Laterality: Right;   IR FLUORO GUIDE CV LINE RIGHT  09/22/2020   IR THORACENTESIS ASP PLEURAL SPACE W/IMG GUIDE  05/19/2019   IR US GUIDE VASC ACCESS LEFT  09/22/2020   IR US GUIDE VASC ACCESS RIGHT  09/22/2020   IR VENOCAVAGRAM SVC  09/22/2020   LAMINECTOMY  05/27/2009   Lumbar decompressive laminectomy, fusion and plating for lumbar spinal stensosis   LIGATION OF ARTERIOVENOUS  FISTULA Left 09/24/2020   Procedure: LIGATION OF LEFT ARM ARTERIOVENOUS  FISTULA;  Surgeon: Larina Earthly, MD;  Location: [facility] OR;  Service: Vascular;  Laterality: Left;   LUMBAR LAMINECTOMY/DECOMPRESSION MICRODISCECTOMY Left 03/23/2013   Procedure: LUMBAR LAMINECTOMY/DECOMPRESSION MICRODISCECTOMY 1 LEVEL;  Surgeon: Tia Alert, MD;  Location: [facility];  Service: Neurosurgery;  Laterality: Left;  LUMBAR LAMINECTOMY/DECOMPRESSION MICRODISCECTOMY 1 LEVEL   MASTECTOMY, PARTIAL Left 02/26/2003   ; S/P re-excision of cranial and lateral margins 04/19/2003.    RE-EXCISION OF BREAST CANCER,SUPERIOR MARGINS Right 10/27/2012   Procedure: RE-EXCISION OF BREAST CANCER,SUPERIOR and inferior MARGINS;  Surgeon: Robyne Askew, MD;  Location: [facility] OR;  Service: General;  Laterality: Right;   RE-EXCISION OF BREAST LUMPECTOMY Left 04/2003   TEE WITHOUT CARDIOVERSION N/A 04/04/2019   Procedure: Transesophageal Echocardiogram (Tee);  Surgeon: Linden Dolin, MD;  Location: [facility] OR;  Service: Open Heart Surgery;  Laterality: N/A;   THORACIC AORTIC ANEURYSM REPAIR N/A 04/04/2019    Procedure: THORACIC ASCENDING ANEURYSM REPAIR (AAA)  USING 28 MM X 30 CM HEMASHIELD PLATINUM VASCULAR GRAFT;  Surgeon: Linden Dolin, MD;  Location: [facility] OR;  Service: Open Heart Surgery;  Laterality: N/A;   Family History  Problem Relation Age of Onset   Colon cancer Mother 70   Hypertension Mother    Diabetes Sister 68   Hypertension Sister    Diabetes Brother    Hypertension Brother    Diabetes Brother    Hypertension Brother    Kidney disease Son        s/p renal transplant   Hypertension Son    Diabetes Son    Multiple sclerosis Son    Bone cancer Sister 70   Breast cancer Neg Hx    Cervical cancer Neg Hx    Social History:  reports that she quit smoking about 2 years ago. Her smoking use included cigarettes. She started smoking about 46 years ago. She has a 11 pack-year smoking history. She has never used smokeless tobacco. She reports that she does not currently use alcohol after a past usage of about 2.0 standard drinks of alcohol per week. She reports that she does not currently  use drugs after having used the following drugs: Cocaine.  ROS: As per HPI otherwise negative.  Physical Exam: Vitals:   01/28/23 1130 01/28/23 1145 01/28/23 1200 01/28/23 1215  BP: (!) 144/101 (!) 168/114 (!) 157/101 (!) 132/96  Pulse: 80 78 96 89  Resp: (!) 25 (!) 25 20 19   Temp:      TempSrc:      SpO2: 98% 97% 98% 98%  Weight:         General: Chronically ill appearing woman, NAD. On nasal O2 Head: Normocephalic, atraumatic, sclera non-icteric, mucus membranes are moist. Neck: Supple without lymphadenopathy/masses. JVD elevated. Lungs: Clear anteriorly - unable to sit up for posterior exam while on HD Heart: Irregularly irregular, 2/6 murmur Abdomen: Soft, non-tender, non-distended with normoactive bowel sounds.  Musculoskeletal:  Strength and tone appear normal for age. Lower extremities: No edema or ischemic changes, no open wounds. Neuro: Alert and oriented X 3. Moves all  extremities spontaneously. Psych:  Responds to questions appropriately with a normal affect. Dialysis Access: TDC in R chest  Allergies  Allergen Reactions   Shrimp [Shellfish Allergy] Shortness Of Breath   Bactroban [Mupirocin] Other (See Comments)    "Sores in nose"   Hydrocodone Itching, Nausea Only and Nausea And Vomiting   Chlorhexidine Gluconate Itching   Tylenol [Acetaminophen] Itching   Zestril [Lisinopril] Cough   Prior to Admission medications   Medication Sig Start Date End Date Taking? Authorizing Provider  albuterol (VENTOLIN HFA) 108 (90 Base) MCG/ACT inhaler Inhale 2 puffs into the lungs every 6 (six) hours as needed for wheezing or shortness of breath. 07/30/20   Janeece Agee, NP  amLODipine (NORVASC) 10 MG tablet Take 1 tablet (10 mg total) by mouth daily. 03/06/22   Setzer, Lynnell Jude, PA-C  apixaban (ELIQUIS) 2.5 MG TABS tablet Take 1 tablet (2.5 mg total) by mouth 2 (two) times daily. 03/06/22   Setzer, Lynnell Jude, PA-C  carvedilol (COREG) 25 MG tablet Take 1 tablet (25 mg total) by mouth 2 (two) times daily with a meal. 05/01/22 05/02/23  Ghimire, Werner Lean, MD  Cholecalciferol (VITAMIN D-3 PO) Take 1 capsule by mouth daily.    [provider]  cloNIDine (CATAPRES) 0.1 MG tablet Take 1 tablet (0.1 mg total) by mouth 2 (two) times daily. 12/18/22   Corky Crafts, MD  hydrALAZINE (APRESOLINE) 100 MG tablet Take 1 tablet (100 mg total) by mouth every 8 (eight) hours. 12/18/22   Corky Crafts, MD  isosorbide mononitrate (IMDUR) 30 MG 24 hr tablet TAKE 1 TABLET (30MG ) ON DIALYSIS DAYS AND 2 TABLETS (60 MG) ON NON DIALYSIS DAYS. 01/12/23   Corky Crafts, MD  naloxone [facility]) nasal spray 4 mg/0.1 mL 1 spray as directed. 02/19/22   [provider]  oxyCODONE-acetaminophen (PERCOCET) 10-325 MG tablet Take 1 tablet by mouth 3 (three) times daily as needed for pain. 03/12/22   [provider]  pantoprazole (PROTONIX) 40 MG tablet TAKE 1 TABLET  (40 MG TOTAL) BY MOUTH 2 (TWO) TIMES DAILY. 10/08/22   Georganna Skeans, MD  rosuvastatin (CRESTOR) 10 MG tablet TAKE 1 TABLET (10 MG TOTAL) BY MOUTH DAILY FOR CHOLESTEROL Patient taking differently: Take 10 mg by mouth daily. 08/07/22   Georganna Skeans, MD  senna-docusate (SENOKOT-S) 8.6-50 MG tablet Take 1 tablet by mouth 2 (two) times daily between meals as needed for mild constipation. 08/14/22   Almon Hercules, MD  sevelamer carbonate (RENVELA) 2.4 g PACK Take 2.4 g by mouth 3 (three) times  daily with meals. 03/06/22   Setzer, Lynnell Jude, PA-C  traZODone (DESYREL) 50 MG tablet Take 0.5 tablets (25 mg total) by mouth at bedtime as needed for sleep. 04/20/22   Georganna Skeans, MD   Current Facility-Administered Medications  Medication Dose Route Frequency Provider Last Rate Last Admin   0.9 %  sodium chloride infusion   Intravenous PRN Oretha Milch, MD   Stopped at 01/28/23 0559   alteplase (CATHFLO ACTIVASE) injection 2 mg  2 mg Intracatheter Once PRN Dagoberto Ligas, MD       amLODipine (NORVASC) tablet 10 mg  10 mg Oral Daily Oretha Milch, MD   10 mg at 01/28/23 4098   anticoagulant sodium citrate solution 5 mL  5 mL Intracatheter PRN Dagoberto Ligas, MD       apixaban Everlene Balls) tablet 2.5 mg  2.5 mg Oral BID Oretha Milch, MD   2.5 mg at 01/28/23 0908   carvedilol (COREG) tablet 25 mg  25 mg Oral BID WC Oretha Milch, MD   25 mg at 01/28/23 0720   cloNIDine (CATAPRES) tablet 0.1 mg  0.1 mg Oral BID Oretha Milch, MD   0.1 mg at 01/28/23 0907   dextrose 10 % infusion   Intravenous Continuous Paliwal, Eliezer Lofts, MD 30 mL/hr at 01/28/23 1100 Infusion Verify at 01/28/23 1100   docusate sodium (COLACE) capsule 100 mg  100 mg Oral BID PRN Oretha Milch, MD       feeding supplement (NEPRO CARB STEADY) liquid 237 mL  237 mL Oral PRN Dagoberto Ligas, MD       heparin injection 1,000 Units  1,000 Units Intracatheter PRN Dagoberto Ligas, MD       heparin injection 2,000 Units  40 Units/kg Dialysis PRN Dagoberto Ligas, MD        hydrALAZINE (APRESOLINE) tablet 100 mg  100 mg Oral Q8H Oretha Milch, MD       ipratropium-albuterol (DUONEB) 0.5-2.5 (3) MG/3ML nebulizer solution 3 mL  3 mL Nebulization Q4H PRN Lidia Collum, PA-C       isosorbide mononitrate (IMDUR) 24 hr tablet 60 mg  60 mg Oral Daily Chand, Garnet Sierras, MD       lidocaine (PF) (XYLOCAINE) 1 % injection 5 mL  5 mL Intradermal PRN Dagoberto Ligas, MD       lidocaine-prilocaine (EMLA) cream 1 Application  1 Application Topical PRN Dagoberto Ligas, MD       nitroGLYCERIN 50 mg in dextrose 5 % 250 mL (0.2 mg/mL) infusion  0-200 mcg/min Intravenous Titrated Carroll Sage, PA-C 25.5 mL/hr at 01/28/23 1100 85 mcg/min at 01/28/23 1100   Oral care mouth rinse  15 mL Mouth Rinse PRN Oretha Milch, MD       pantoprazole (PROTONIX) EC tablet 40 mg  40 mg Oral BID Oretha Milch, MD   40 mg at 01/28/23 1191   pentafluoroprop-tetrafluoroeth (GEBAUERS) aerosol 1 Application  1 Application Topical PRN Dagoberto Ligas, MD       polyethylene glycol (MIRALAX / GLYCOLAX) packet 17 g  17 g Oral Daily PRN Oretha Milch, MD       rosuvastatin (CRESTOR) tablet 10 mg  10 mg Oral QHS Cheri Fowler, MD       senna-docusate (Senokot-S) tablet 1 tablet  1 tablet Oral BID BM PRN Oretha Milch, MD       Labs: Basic Metabolic Panel: Recent Labs  Lab 01/27/23 2312  01/27/23 2318 01/28/23 0119  NA 144 139  139  --   K 7.0* 6.7*  6.7* 5.8*  CL 101 107  --   CO2 18*  --   --   GLUCOSE 117* 115*  --   BUN 87* 81*  --   CREATININE 12.90* 14.20*  --   CALCIUM 9.1  --   --    Liver Function Tests: Recent Labs  Lab 01/27/23 2312  AST 27  ALT 16  ALKPHOS 78  BILITOT 0.4  PROT 7.5  ALBUMIN 4.0   CBC: Recent Labs  Lab 01/27/23 2312 01/27/23 2318  WBC 6.9  --   NEUTROABS 5.7  --   HGB 9.4* 10.5*  10.5*  HCT 30.6* 31.0*  31.0*  MCV 94.7  --   PLT 200  --    CBG: Recent Labs  Lab 01/28/23 0350 01/28/23 0556 01/28/23 0612 01/28/23 0900 01/28/23 1103  GLUCAP  147* 22* 156* 61* 107*   Studies/Results: DG Chest Portable 1 View  Result Date: 01/27/2023 CLINICAL DATA:  Shortness of breath EXAM: PORTABLE CHEST 1 VIEW COMPARISON:  01/14/2023 FINDINGS: Postoperative changes in the mediastinum. Right central venous catheter with tip over the cavoatrial junction region. Cardiac enlargement. Mild perihilar infiltration, likely edema or pneumonia. This is mildly progressed since prior study. No pleural effusions. No pneumothorax. Calcification of the aorta. IMPRESSION: Cardiac enlargement. Perihilar infiltrates with mild progression since prior study. Electronically Signed   By: Burman Nieves M.D.   On: 01/27/2023 23:32    Dialysis Orders:  3:30hr, 400/A1.5, EDW 45.5kg, 2K/2Ca bath, TDC, UFP #2, no heparin - Last HD 8/24 - left at 47.1kg -> missed HD on 8/27 - Mircera IV q 2 weeks - last 8/13 - Venofer 50mg  IV weekly - Hectoral IV q HD  Assessment/Plan:  Acute Hypoxic RF/pulm edema: On HD now - 5L UFG as tolerated. Hyperkalemia: Will improve with HD.  HTN emergency: Requiring NTG drip  ESRD:  Usual TTS schedule - missed last HD -> HD now, large UF goal as tolerated.  Anemia: Hgb 10.5 - no ESA needs at this time.  Metabolic bone disease: Ca ok, Phos pending. Resume home meds once stable.  A-fib  HFrEF  Ozzie Hoyle, PA-C 01/28/2023, 12:23 PM  [facility]   Seen and examined independently.  Agree with note and exam as documented above by physician extender and as noted here.  Monique Henderson is a 72 year old female with a history of ESRD on hemodialysis Tuesday Thursday Saturday, hypertension, atrial fibrillation, heart failure with reduced ejection fraction, and COPD who presented to the hospital with shortness of breath.  She was found to have oxygen saturations in the 70s and was ordered for BiPAP.   Initially on presentation she was very hypertensive and had been started on nitroglycerin infusion.  She has been at times  altered per charting She has just finished with HD.  Had 3.4 kg per RN verbal report. At the time of my exam she had been titrated to 4 L of oxygen and her breathing is much better.  She states that she missed the bus for her last scheduled dialysis treatment.  She does not know what day it is and is not oriented as to the year. We discussed fluid restriction and potassium dietary restriction.  She states that she lives with a friend.   General frail chronically ill appearing elderly female in bed in no acute distress HEENT normocephalic atraumatic extraocular movements intact sclera  anicteric Neck supple trachea midline Lungs clear but reduced on auscultation; on 4 liters oxygen and 100%; some increased work of breathing with prolonged speech but much improved per staff report Heart S1S2 no rub Abdomen soft nontender nondistended Extremities no edema  Psych normal mood and affect Neuro - oriented to person and location not to year (1924 then guesses 1948); provides basic hx  Access RIJ tunn catheter   # Acute hypoxic respiratory failure - Optimize volume with dialysis - Her oxygen requirement has improved considerably with fluid removal - I have discussed importance of fluid restrictions as well as dialysis compliance.  She indicates that she missed her ride  # Hyperkalemia  - HD earlier today. - Repeat potassium at 1400 to assess for any rebound - we discussed low K diet and HD compliance   # Hypertensive emergency - Initially required a nitro gtt.  Wean per critical care.  # End-stage renal disease  - Tuesday Thursday Saturday hemodialysis schedule.   - Will assess dialysis needs daily  # Anemia of CKD - Hb acceptable   # Metabolic bone disease  - Check Phos in AM - Resume home medications once taking p.o. - resp status is improved   # Atrial fibrillation - Rate control per primary team  # Heart failure with reduced ejection fraction -As above optimize volume status with  HD  Thank you for the consult.  Please do not hesitate to contact me with any questions regarding our patient  Estanislado Emms, MD 01/28/2023 1:28 PM

## 2023-01-28 NOTE — Progress Notes (Signed)
Pt noted to have rapid heart rate and rhythm reaching 140s. BP labile. Elink informed and was able to camera into the room. Informed of regularity and EKG obtained. EKG placed in chart and MD made aware. No new orders at this time. Pt remains alert and oriented with previously noted impulsiveness and irritability. BP continues to be labile. Pt then noted to be in and out of irregular rhythm in the range of 100-120s. Pt remains alert and oriented throughout. Elink continues to be aware. No new orders at this time.

## 2023-01-28 NOTE — Progress Notes (Addendum)
eLink Physician-Brief Progress Note Patient Name: Monique Henderson DOB: 06-15-1950 MRN: [id number]   Date of Service  01/28/2023  HPI/Events of Note  71 year old female with a history of end-stage renal disease on hemodialysis, atrial fibrillation on Eliquis, heart failure with reduced ejection fraction, COPD who initially presented to the emergency department with respiratory distress in the setting of a hypertensive crisis.  Currently on BiPAP for support and urgent dialysis is pending.  Patient presented with blood pressures 212/134, tachypnea, and saturation of 100% on 100% FiO2.  Already started on a nitroglycerin drip.  Metabolic panel consistent with hypokalemia, anion gap metabolic acidosis, hypocalcemia and elevated BNP.  Evidence of hypoglycemia.  Chest radiograph with evidence of mild fluid overload.  eICU Interventions  Oral antihypertensives have been initiated, maintain nitroglycerin in the meantime  Awaiting dialysis.  Right subclavian catheter in place.  DVT prophylaxis with apixaban GI prophylaxis with home pantoprazole   0558 -patient was previously hypoglycemic.  Notified by bedside staff that the patient has a reduced level of responsiveness.  Stroke scale remains relatively stable, she is able to perform all of the activities required by she is less acutely responsive.  Glucose on check at 22.  Will administer D50 1 amp as well as initiated D10 drip.  Emergent dialysis was ordered several hours ago and still pending.  6213 -concern for irregular rhythm.  The patient appears to be in sinus arrhythmia with otherwise stable vitals.  Awaiting emergent dialysis.  No immediate intervention is indicated.  Intervention Category Evaluation Type: New Patient Evaluation  Cypress Hinkson 01/28/2023, 3:46 AM

## 2023-01-28 NOTE — Progress Notes (Signed)
72 year old female who was admitted early morning hours after she presented with shortness of breath in the setting of missing hemodialysis on Tuesday Also noted to be in hypertensive emergency, nitroglycerin infusion  Stated breathing is better, this is her second admission this month for same issue missing hemodialysis and not taking blood pressure medications  Physical exam: General: Chronically ill-appearing female, lying on the bed HEENT: Ger[place]/AT, eyes anicteric.  moist mucus membranes Neuro: Alert, awake following commands.  Has right facial droop, chronic with slight weakness on right side Chest: Bilateral basal crackles, no wheezes or rhonchi Heart: Irregular, no murmurs or gallops Abdomen: Soft, nontender, nondistended, bowel sounds present Skin: No rash  Assessment plan: Hypertensive emergency Acute hypoxic respiratory failure due to flash pulmonary edema End-stage renal disease, with missed hemodialysis Volume overload Refractory hyperkalemia Paroxysmal A-fib Acute on chronic systolic/diastolic heart failure Anemia of chronic disease Prior stroke with residual left-sided weakness and facial droop  Continue to titrate nitroglycerin infusion with SBP goal 140-160 Continue nasal cannula oxygen, titrate with O2 sat goal 92% Patient is receiving hemodialysis now, resume oral antihypertensive meds Repeat serum potassium postdialysis Converted back to sinus rhythm Monitor H&H Social worker consulted considering patient's second admission for same issue of missing hemodialysis Continue Eliquis   The patient is critically ill due to hypertensive emergency/acute respiratory failure with hypoxia/pulmonary edema/and his renal disease with missing hemodialysis.  Critical care was necessary to treat or prevent imminent or life-threatening deterioration.  Critical care was time spent personally by me on the following activities: development of treatment plan with patient and/or surrogate  as well as nursing, discussions with consultants, evaluation of patient's response to treatment, examination of patient, obtaining history from patient or surrogate, ordering and performing treatments and interventions, ordering and review of laboratory studies, ordering and review of radiographic studies, pulse oximetry, re-evaluation of patient's condition and participation in multidisciplinary rounds.   During this encounter critical care time was devoted to patient care services described in this note for 32 minutes.     Cheri Fowler, MD [facility] See Amion for pager If no response to pager, please call [phone] until 7pm After 7pm, Please call E-link [phone]

## 2023-01-28 NOTE — Progress Notes (Signed)
CHG not ordered due to pt allergy.

## 2023-01-28 NOTE — Progress Notes (Signed)
Pt transported from ED 26 to 2H07 without complications.

## 2023-01-28 NOTE — Progress Notes (Signed)
   01/28/23 1251  Vitals  Temp 98.2 F (36.8 C)  Temp Source Oral  BP 139/78  Oxygen Therapy  O2 Device Nasal Cannula  O2 Flow Rate (L/min) 4 L/min  During Treatment Monitoring  Intra-Hemodialysis Comments Tx completed  Post Treatment  Dialyzer Clearance Lightly streaked  Hemodialysis Intake (mL) 0 mL  Liters Processed 72  Fluid Removed (mL) 3400 mL  Tolerated HD Treatment Yes  Post-Hemodialysis Comments Pt unable to UF 4 L as ordered  d/t cramping.  Hemodialysis Catheter Right Subclavian  No placement date or time found.   Orientation: Right  Access Location: Subclavian  Site Condition No complications  Blue Lumen Status Heparin locked  Red Lumen Status Heparin locked  Catheter fill solution Heparin 1000 units/ml  Catheter fill volume (Arterial) 1.9 cc  Catheter fill volume (Venous) 1.9  Dressing Type Transparent  Dressing Status Antimicrobial disc in place;Clean, Dry, Intact  Interventions New dressing  Drainage Description None  Dressing Change Due 02/04/23  Post treatment catheter status Capped and Clamped

## 2023-01-29 ENCOUNTER — Encounter (HOSPITAL_COMMUNITY): Payer: Self-pay | Admitting: Pulmonary Disease

## 2023-01-29 DIAGNOSIS — J9601 Acute respiratory failure with hypoxia: Secondary | ICD-10-CM | POA: Diagnosis not present

## 2023-01-29 LAB — BASIC METABOLIC PANEL
Anion gap: 16 — ABNORMAL HIGH (ref 5–15)
BUN: 52 mg/dL — ABNORMAL HIGH (ref 8–23)
CO2: 25 mmol/L (ref 22–32)
Calcium: 8.9 mg/dL (ref 8.9–10.3)
Chloride: 96 mmol/L — ABNORMAL LOW (ref 98–111)
Creatinine, Ser: 8.44 mg/dL — ABNORMAL HIGH (ref 0.44–1.00)
GFR, Estimated: 5 mL/min — ABNORMAL LOW (ref >60)
Glucose, Bld: 98 mg/dL (ref 70–99)
Potassium: 4.3 mmol/L (ref 3.5–5.1)
Sodium: 137 mmol/L (ref 135–145)

## 2023-01-29 LAB — CBC
HCT: 26.6 % — ABNORMAL LOW (ref 36.0–46.0)
Hemoglobin: 8.3 g/dL — ABNORMAL LOW (ref 12.0–15.0)
MCH: 29.5 pg (ref 26.0–34.0)
MCHC: 31.2 g/dL (ref 30.0–36.0)
MCV: 94.7 fL (ref 80.0–100.0)
Platelets: 162 10*3/uL (ref 150–400)
RBC: 2.81 MIL/uL — ABNORMAL LOW (ref 3.87–5.11)
RDW: 18.7 % — ABNORMAL HIGH (ref 11.5–15.5)
WBC: 4.7 10*3/uL (ref 4.0–10.5)
nRBC: 0 % (ref 0.0–0.2)

## 2023-01-29 LAB — LIPID PANEL
Cholesterol: 104 mg/dL (ref 0–200)
HDL: 49 mg/dL (ref >40)
LDL Cholesterol: 46 mg/dL (ref 0–99)
Total CHOL/HDL Ratio: 2.1 ratio
Triglycerides: 46 mg/dL (ref <150)
VLDL: 9 mg/dL (ref 0–40)

## 2023-01-29 LAB — HEPATITIS B SURFACE ANTIGEN: Hepatitis B Surface Ag: NONREACTIVE

## 2023-01-29 LAB — PHOSPHORUS: Phosphorus: 5.4 mg/dL — ABNORMAL HIGH (ref 2.5–4.6)

## 2023-01-29 LAB — MAGNESIUM: Magnesium: 2 mg/dL (ref 1.7–2.4)

## 2023-01-29 MED ORDER — DARBEPOETIN ALFA 100 MCG/0.5ML IJ SOSY
100.0000 ug | PREFILLED_SYRINGE | INTRAMUSCULAR | Status: DC
  Administered 2023-01-29: 100 ug via SUBCUTANEOUS
  Filled 2023-01-29: qty 0.5

## 2023-01-29 MED ORDER — CHLORHEXIDINE GLUCONATE CLOTH 2 % EX PADS: 6.0000 | MEDICATED_PAD | Freq: Every day | CUTANEOUS | Status: DC

## 2023-01-29 NOTE — Progress Notes (Signed)
PROGRESS NOTE    Monique Henderson  MVH:[id number] DOB: Aug 21, 1950 DOA: 01/27/2023 PCP: Fleet Contras, MD    Brief Narrative:   Monique Henderson is a 72 y.o. female with past medical history significant for ESRD on HD TTS, paroxysmal atrial fibrillation on Eliquis, chronic systolic congestive heart failure, COPD, hepatitis C, HTN who presented to [facility] ED on 8/29 with respiratory distress after missing hemodialysis.  Patient reports missed her HD on 8/27 due to transportation issues.  Send she has had increased work of breathing and called EMS on 8/28; and subsequently was placed on CPAP with noted SpO2 77% on room air; and subsequently transported to the ED via EMS for further evaluation management.  Patient recently admitted on 8/15 with similar admission after missing HD and having respiratory distress. She has a documented history of medical/HD noncompliance. Patient states that she missed her dialysis on 8/27. She states that she had no way to get to her dialysis session.   In the ED, temperature 99.2 F, HR 93, RR 24, BP 212/134, SpO2 100% on BiPAP.  WBC 6.9, hemoglobin 9.4, platelet count 200.  Sodium 144, potassium 7.0, chloride 101, CO2 18, glucose 117, BUN 87, creatinine 12.90.  BNP greater than 4500.  High sensitive troponin 75 followed by 76.  EtOH level less than 10.  Chest x-ray with cardiac enlargement, perihilar infiltrates with mild progression since prior study.  PCCM was consulted and patient was admitted to the intensive care unit on a nitroglycerin drip with nephrology in consult.  Significant Hospital events: 8/28: Admitted to the PCCM service, on nitroglycerin drip, on BiPAP, nephrology consulted for hemodialysis 8/29: Nitroglycerin drip weaned off, weaned off of BiPAP 8/30: Transferred to hospitalist service, nephrology plans HD tomorrow  Assessment & Plan:   Acute hypoxic respiratory failure, POA Acute on chronic systolic congestive heart  failure Flash pulmonary edema Patient presenting to ED via EMS with progressive shortness of breath in the setting of missed hemodialysis and hypertensive emergency.  Patient was noted to have SpO2 77% on room air and placed on CPAP/BiPAP.  BNP elevated greater than 4500.  Chest x-ray with cardiac enlargement perihilar infiltrate consistent with pulmonary edema.  Patient was initially admitted to the [facility] service and placed on a nitroglycerin drip.  Nephrology was consulted and patient underwent hemodialysis improvement of her respiratory status and now tried titrated off of supplemental oxygen. -- Continue carvedilol 25 mg p.o. twice daily -- Volume management with hemodialysis -- Strict I's and O's and daily weights  Hypertensive emergency Hx essential hypertension On arrival BP noted to be 212/134.  Etiology likely secondary to volume overload, medical noncompliance.  Patient initially was placed on a nitroglycerin drip which was titrated off after reinitiation of her home antihypertensives. -- Carvedilol 25 mg p.o. twice daily -- Clonidine 0.1 mg p.o. twice daily -- Hydralazine 100 mg p.o. every 8 hours -- Imdur 30 mg p.o. daily -- Continue monitor blood pressure.  Troponin likely secondary to type II demand ischemia Troponin elevated 75 followed by 76.  Denies chest pain.  Etiology likely secondary to type II demand ischemia in the setting of severe volume overload, hypertensive emergency as above. -- Continue to monitor on telemetry  Hyperkalemia: Resolved Potassium 7.0 on admission, likely secondary to missed hemodialysis.  Treated in the ED with sodium bicarbonate, albuterol neb, IV insulin and patient underwent hemodialysis with resolution. -- Renal panel in a.m.  ESRD on HD TTS -- Nephrology following, appreciate assistance -- Plan next  HD 8/31 -- TOC consulted for assistance with patient has transportation issues  Paroxysmal atrial fibrillation -- Carvedilol 25 mg p.o. twice  daily -- Eliquis 2.5 mg p.o. twice daily  Hyperlipidemia -- Crestor 10 mg p.o. nightly  COPD Oxygen now titrated off. -- DuoNebs every 4 hours as needed wheezing/shortness of breath  GERD --Protonix 40 mg p.o. daily   DVT prophylaxis: apixaban (ELIQUIS) tablet 2.5 mg Start: 01/28/23 1000 apixaban (ELIQUIS) tablet 2.5 mg    Code Status: Full Code Family Communication: No family present at bedside this morning  Disposition Plan:  Level of care: Telemetry Cardiac Status is: Inpatient Remains inpatient appropriate because: Plan for repeat hemodialysis tomorrow, then likely will be able to discharge home    Consultants:  Nephrology PCCM  Procedures:  BiPAP/CPAP  Antimicrobials:  None   Subjective: Patient seen examined bedside, resting comfortably.  Lying in bed.  Overall feels much improved.  Discussed will have TOC evaluate for transportation needs that is the leading etiology to her recurrent hospitalizations.  Patient reports that she has all of her medications at home and her sister assists her with management.  Will have repeat hemodialysis tomorrow and then likely discharge home thereafter.  No other questions or concerns at this time.  Denies headache, no dizziness, no chest pain, no palpitations, no shortness of breath, no abdominal pain, no fever/chills/night sweats, no nausea cefonicid diarrhea, no focal weakness, no fatigue, no paresthesias.  No acute events overnight per nursing staff.  Objective: Vitals:   01/28/23 1900 01/28/23 2151 01/28/23 2331 01/29/23 0538  BP: 139/88 (!) 154/95 (!) 161/89 (!) 174/93  Pulse: 77   75  Resp: 19   20  Temp: 99.1 F (37.3 C)  98.9 F (37.2 C) 98.7 F (37.1 C)  TempSrc: Oral  Oral Oral  SpO2: 98%   96%  Weight:    48.5 kg    Intake/Output Summary (Last 24 hours) at 01/29/2023 1155 Last data filed at 01/28/2023 1400 Gross per 24 hour  Intake 184.56 ml  Output 3400 ml  Net -3215.44 ml   Filed Weights   01/28/23 0921  01/28/23 1256 01/29/23 0538  Weight: 50.4 kg 47 kg 48.5 kg    Examination:  Physical Exam: GEN: NAD, alert and oriented x 3, chronically ill/thin/cachectic in appearance, appears older than stated age HEENT: NCAT, PERRL, EOMI, sclera clear, MMM PULM: CTAB w/o wheezes/crackles, normal respiratory effort, on room air CV: RRR w/o M/G/R GI: abd soft, NTND, NABS MSK: no peripheral edema, moves all extremities independently NEURO: No focal neurological deficits PSYCH: normal mood/affect Integumentary: dry/intact, no rashes or wounds    Data Reviewed: I have personally reviewed following labs and imaging studies  CBC: Recent Labs  Lab 01/27/23 2312 01/27/23 2318 01/29/23 0317  WBC 6.9  --  4.7  NEUTROABS 5.7  --   --   HGB 9.4* 10.5*  10.5* 8.3*  HCT 30.6* 31.0*  31.0* 26.6*  MCV 94.7  --  94.7  PLT 200  --  162   Basic Metabolic Panel: Recent Labs  Lab 01/27/23 2312 01/27/23 2318 01/28/23 0119 01/28/23 1429 01/29/23 0317  NA 144 139  139  --   --  137  K 7.0* 6.7*  6.7* 5.8* 4.1 4.3  CL 101 107  --   --  96*  CO2 18*  --   --   --  25  GLUCOSE 117* 115*  --   --  98  BUN 87* 81*  --   --  52*  CREATININE 12.90* 14.20*  --   --  8.44*  CALCIUM 9.1  --   --   --  8.9  MG 2.5*  --   --   --  2.0  PHOS  --   --   --   --  5.4*   GFR: Estimated Creatinine Clearance: 4.7 mL/min (A) (by C-G formula based on SCr of 8.44 mg/dL (H)). Liver Function Tests: Recent Labs  Lab 01/27/23 2312  AST 27  ALT 16  ALKPHOS 78  BILITOT 0.4  PROT 7.5  ALBUMIN 4.0   No results for input(s): "LIPASE", "AMYLASE" in the last 168 hours. No results for input(s): "AMMONIA" in the last 168 hours. Coagulation Profile: No results for input(s): "INR", "PROTIME" in the last 168 hours. Cardiac Enzymes: No results for input(s): "CKTOTAL", "CKMB", "CKMBINDEX", "TROPONINI" in the last 168 hours. BNP (last 3 results) No results for input(s): "PROBNP" in the last 8760 hours. HbA1C: No  results for input(s): "HGBA1C" in the last 72 hours. CBG: Recent Labs  Lab 01/28/23 0900 01/28/23 1103 01/28/23 1429 01/28/23 1643 01/28/23 1806  GLUCAP 61* 107* 144* 79 98   Lipid Profile: Recent Labs    01/29/23 0317  CHOL 104  HDL 49  LDLCALC 46  TRIG 46  CHOLHDL 2.1   Thyroid Function Tests: No results for input(s): "TSH", "T4TOTAL", "FREET4", "T3FREE", "THYROIDAB" in the last 72 hours. Anemia Panel: No results for input(s): "VITAMINB12", "FOLATE", "FERRITIN", "TIBC", "IRON", "RETICCTPCT" in the last 72 hours. Sepsis Labs: No results for input(s): "PROCALCITON", "LATICACIDVEN" in the last 168 hours.  Recent Results (from the past 240 hour(s))  MRSA Next Gen by PCR, Nasal     Status: Abnormal   Collection Time: 01/28/23  4:03 AM   Specimen: Nasal Mucosa; Nasal Swab  Result Value Ref Range Status   MRSA by PCR Next Gen DETECTED (A) NOT DETECTED Final    Comment: RESULTS CALLED TO, READ BACK BY AND VERIFIED WITH RN S.DAVIS ON 01/28/23 AT 0532 BY NM (NOTE) The GeneXpert MRSA Assay (FDA approved for NASAL specimens only), is one component of a comprehensive MRSA colonization surveillance program. It is not intended to diagnose MRSA infection nor to guide or monitor treatment for MRSA infections. Test performance is not FDA approved in patients less than 5 years old. Performed at [facility], [address], [place], [place] [address]          Radiology Studies: DG Chest Portable 1 View  Result Date: 01/27/2023 CLINICAL DATA:  Shortness of breath EXAM: PORTABLE CHEST 1 VIEW COMPARISON:  01/14/2023 FINDINGS: Postoperative changes in the mediastinum. Right central venous catheter with tip over the cavoatrial junction region. Cardiac enlargement. Mild perihilar infiltration, likely edema or pneumonia. This is mildly progressed since prior study. No pleural effusions. No pneumothorax. Calcification of the aorta. IMPRESSION: Cardiac enlargement. Perihilar  infiltrates with mild progression since prior study. Electronically Signed   By: Burman Nieves M.D.   On: 01/27/2023 23:32        Scheduled Meds:  amLODipine  10 mg Oral Daily   apixaban  2.5 mg Oral BID   carvedilol  25 mg Oral BID WC   Chlorhexidine Gluconate Cloth  6 each Topical Q0600   cloNIDine  0.1 mg Oral BID   darbepoetin (ARANESP) injection - NON-DIALYSIS  100 mcg Subcutaneous Q Fri-1800   hydrALAZINE  100 mg Oral Q8H   isosorbide mononitrate  30 mg Oral Daily   pantoprazole  40  mg Oral Daily   rosuvastatin  10 mg Oral QHS   Continuous Infusions:  sodium chloride Stopped (01/28/23 0559)   anticoagulant sodium citrate     nitroGLYCERIN 5 mcg/min (01/28/23 1446)     LOS: 1 day    Time spent: 52 minutes spent on chart review, discussion with nursing staff, consultants, updating family and interview/physical exam; more than 50% of that time was spent in counseling and/or coordination of care.    Alvira Philips [place], DO [facility] Available via Epic secure chat 7am-7pm After these hours, please refer to coverage provider listed on [email or web address] 01/29/2023, 11:55 AM

## 2023-01-29 NOTE — Progress Notes (Signed)
Pt's case discussed with CSW this afternoon and below info provided. Pt receives out-pt HD at [facility] on TTS. Pt should arrive 11:25-11:30 for 11:45 chair time. Clinic staff reports that pt uses [facility] and family to transport to/from HD. Pt's off time is 3:15 and should be ready for pick-up at 3:45 pm. Will assist as needed.   Olivia Canter Renal Navigator [phone]

## 2023-01-29 NOTE — Progress Notes (Addendum)
[facility] Progress Note   Subjective:   Patient seen and examined at bedside.  Just finished breakfast.  Has oranges and bananas with breakfast, counseled on low K diet and to avoid these foods. Breathing improved.  Denies CP, SOB, abdominal pain and n/v/d.    Objective Vitals:   01/28/23 1900 01/28/23 2151 01/28/23 2331 01/29/23 0538  BP: 139/88 (!) 154/95 (!) 161/89 (!) 174/93  Pulse: 77   75  Resp: 19   20  Temp: 99.1 F (37.3 C)  98.9 F (37.2 C) 98.7 F (37.1 C)  TempSrc: Oral  Oral Oral  SpO2: 98%   96%  Weight:    48.5 kg   Physical Exam General:chronically ill appearing female in NAD Heart:IRIR, +2/6 systolic murmur Lungs:CTAB, nml WOB on RA Abdomen:soft, NTND Extremities:No LE edema Dialysis Access: [facility]   Filed Weights   01/28/23 0921 01/28/23 1256 01/29/23 0538  Weight: 50.4 kg 47 kg 48.5 kg    Intake/Output Summary (Last 24 hours) at 01/29/2023 0902 Last data filed at 01/28/2023 1400 Gross per 24 hour  Intake 353.81 ml  Output 3400 ml  Net -3046.19 ml    Additional Objective Labs: Basic Metabolic Panel: Recent Labs  Lab 01/27/23 2312 01/27/23 2318 01/28/23 0119 01/28/23 1429 01/29/23 0317  NA 144 139  139  --   --  137  K 7.0* 6.7*  6.7* 5.8* 4.1 4.3  CL 101 107  --   --  96*  CO2 18*  --   --   --  25  GLUCOSE 117* 115*  --   --  98  BUN 87* 81*  --   --  52*  CREATININE 12.90* 14.20*  --   --  8.44*  CALCIUM 9.1  --   --   --  8.9  PHOS  --   --   --   --  5.4*   Liver Function Tests: Recent Labs  Lab 01/27/23 2312  AST 27  ALT 16  ALKPHOS 78  BILITOT 0.4  PROT 7.5  ALBUMIN 4.0   CBC: Recent Labs  Lab 01/27/23 2312 01/27/23 2318 01/29/23 0317  WBC 6.9  --  4.7  NEUTROABS 5.7  --   --   HGB 9.4* 10.5*  10.5* 8.3*  HCT 30.6* 31.0*  31.0* 26.6*  MCV 94.7  --  94.7  PLT 200  --  162    CBG: Recent Labs  Lab 01/28/23 0900 01/28/23 1103 01/28/23 1429 01/28/23 1643 01/28/23 1806  GLUCAP 61* 107* 144*  79 98    Studies/Results: DG Chest Portable 1 View  Result Date: 01/27/2023 CLINICAL DATA:  Shortness of breath EXAM: PORTABLE CHEST 1 VIEW COMPARISON:  01/14/2023 FINDINGS: Postoperative changes in the mediastinum. Right central venous catheter with tip over the cavoatrial junction region. Cardiac enlargement. Mild perihilar infiltration, likely edema or pneumonia. This is mildly progressed since prior study. No pleural effusions. No pneumothorax. Calcification of the aorta. IMPRESSION: Cardiac enlargement. Perihilar infiltrates with mild progression since prior study. Electronically Signed   By: Burman Nieves M.D.   On: 01/27/2023 23:32    Medications:  sodium chloride Stopped (01/28/23 0559)   anticoagulant sodium citrate     nitroGLYCERIN 5 mcg/min (01/28/23 1446)    amLODipine  10 mg Oral Daily   apixaban  2.5 mg Oral BID   carvedilol  25 mg Oral BID WC   cloNIDine  0.1 mg Oral BID   hydrALAZINE  100 mg Oral Q8H  isosorbide mononitrate  30 mg Oral Daily   pantoprazole  40 mg Oral Daily   rosuvastatin  10 mg Oral QHS    Dialysis Orders: 3:30hr, 400/A1.5, EDW 45.5kg, 2K/2Ca bath, TDC, UFP #2, no heparin - Last HD 8/24 - left at 47.1kg -> missed HD on 8/27 - Mircera IV q 2 weeks - last 8/13 - Venofer 50mg  IV weekly - Hectoral IV q HD   Assessment/Plan:  Acute Hypoxic RF/pulm edema: improved post HD. On RA. Net UF 3.4L. Remains 3.5L over dry - plan for UF to dry weight tomorrow. Hyperkalemia: resolved post HD, change to renal diet. K 4.3.  HTN emergency: Required NTG drip. Improved today but remains elevated, continue home meds.  ESRD:  Usual TTS schedule. Hx non compliance.  Plan for HD tomorrow per regular schedule.   Anemia: Hgb 8.3. ESA over due, will order.   Metabolic bone disease: Ca and phos in goal.  Continue binders and VDRA.  A-fib  HFrEF   Virgina Norfolk, PA-C [facility] 01/29/2023,9:02 AM  LOS: 1 day    Seen and examined  independently.  Agree with note and exam as documented above by physician extender and as noted here.  She was moved to the floor in the interim.  Had orange juice this AM and a banana was also on her tray - didn't eat the banana and she's ok with me throwing it away.  She eats ice at home and I counseled against this.  Sister at bedside.  The patient states that she didn't know the low K diet information we discussed which would seem less likely.    General frail chronically ill appearing elderly female in bed in no acute distress HEENT normocephalic atraumatic extraocular movements intact sclera anicteric Neck supple trachea midline Lungs clear but reduced on auscultation; normal work of breathing at rest Heart S1S2 no rub Abdomen soft nontender nondistended Extremities no edema  Psych normal mood and affect Neuro  conversant and follows commands; provides basic hx  Access RIJ tunn catheter    # Acute hypoxic respiratory failure - Improved with UF - Optimize volume with dialysis - I have discussed importance of fluid restrictions as well as dialysis compliance   # Hyperkalemia  - we discussed low K diet and HD compliance  - she now has a renal diet ordered and I asked that she continue these restrictions on discharge   # Hypertensive emergency - Initially required a nitro gtt.   - Continue home meds   # End-stage renal disease  - Tuesday Thursday Saturday hemodialysis schedule.        # Anemia of CKD - redose ESA as above    # Metabolic bone disease  - Check Phos in AM - Resume home medications once taking p.o. - resp status is improved    # Atrial fibrillation - Rate control per primary team   # Heart failure with reduced ejection fraction -As above optimize volume status with HD   Disposition per primary team   Estanislado Emms, MD 01/29/2023 12:19 PM

## 2023-01-29 NOTE — TOC Initial Note (Addendum)
Transition of Care [facility]) - Initial/Assessment Note    Patient Details  Name: Monique Henderson MRN: [id number] Date of Birth: 08-29-1950  [facility] CM/SW Contact:    Delilah Shan, LCSWA Phone Number: 01/29/2023, 1:02 PM  Clinical Narrative:                  CSW received consult for patient. Patient reports she comes from home with friend. Patient reports she has transportation to and from dialysis through [facility]. Renal Navigator confirmed Clinic staff reports that pt uses [facility] and family to transport to/from HD.Patient gave CSW permission to call [facility] to confirm transportation is arranged. CSW informed patient that CSW checked on transportation and that transportation already scheduled for Tues,Thurs.,Sat.. Pick up time from home is 10:10am-10:40am to arrive at clinic between 11:25-11:30am for an 11:45am seat time.  Pick up time from HD is between 3:30pm-4:00pm..  All questions answered. No further questions reported at this time.       Patient Goals and CMS Choice            Expected Discharge Plan and Services                                              Prior Living Arrangements/Services                       Activities of Daily Living Home Assistive Devices/Equipment: None ADL Screening (condition at time of admission) Patient's cognitive ability adequate to safely complete daily activities?: Yes Is the patient deaf or have difficulty hearing?: No Does the patient have difficulty seeing, even when wearing glasses/contacts?: No Does the patient have difficulty concentrating, remembering, or making decisions?: No Patient able to express need for assistance with ADLs?: Yes Does the patient have difficulty dressing or bathing?: No Independently performs ADLs?: Yes (appropriate for developmental age) Does the patient have difficulty walking or climbing stairs?: No Weakness of Legs: Both Weakness of Arms/Hands:  Both  Permission Sought/Granted                  Emotional Assessment              Admission diagnosis:  Hyperkalemia [E87.5] Acute respiratory failure with hypoxia (HCC) [J96.01] Patient Active Problem List   Diagnosis Date Noted   Acute respiratory failure with hypoxia (HCC) 01/28/2023   Advance care planning 01/15/2023   Hypercoagulable state due to paroxysmal atrial fibrillation (HCC) 10/21/2022   Heart failure with mildly reduced ejection fraction (HFmrEF) (HCC) 08/13/2022   Agitation 08/13/2022   Pulmonary edema, acute (HCC) 08/11/2022   Right shoulder pain 06/17/2022   Edema of right upper arm 06/17/2022   Right hemiparesis (HCC) 04/06/2022   Acute cardioembolic stroke (HCC) 02/26/2022   Stroke (cerebrum) (HCC) 02/23/2022   Stroke (HCC) 02/23/2022   Hypervolemia associated with renal insufficiency 01/29/2022   Dyslipidemia 01/29/2022   DNR (do not resuscitate) 01/29/2022   Polysubstance abuse (HCC) 01/06/2022   Acute on chronic combined systolic and diastolic CHF (congestive heart failure) (HCC) 01/06/2022   Abdominal pain 01/06/2022   Acute metabolic encephalopathy 12/24/2021   History of hepatitis C 12/08/2021   Hypertensive urgency 10/29/2021   Fluid overload 10/29/2021   Acute on chronic HFrEF (heart failure with reduced ejection fraction) (HCC) 10/29/2021   Chronic anticoagulation 10/29/2021   Prolonged  QT interval 10/29/2021   Leukopenia 10/29/2021   SOB (shortness of breath) 09/15/2021   Hypertensive emergency 08/23/2021   Altered mental status    Malignant hypertension 06/19/2021   Anemia of renal disease 06/05/2021   Chronic diastolic congestive heart failure (HCC) 04/30/2021   Pure hypercholesterolemia 02/14/2021   Respiratory distress determined by examination 09/21/2020   Acute renal failure superimposed on chronic kidney disease (HCC) 09/21/2020   Community acquired pneumonia 09/21/2020   Uremia 09/21/2020   Metabolic acidosis with  increased anion gap and accumulation of organic acids 09/21/2020   Hypertension 09/21/2020   Troponin level elevated 08/28/2019   Positive D dimer 08/28/2019   Hyperkalemia 08/28/2019   SVT (supraventricular tachycardia) 08/28/2019   SIRS (systemic inflammatory response syndrome) (HCC) 08/27/2019   Paroxysmal SVT (supraventricular tachycardia) 08/27/2019   Aortic atherosclerosis (HCC) 07/05/2019   Chest pain 07/05/2019   History of CVA (cerebrovascular accident) 05/29/2019   Paroxysmal atrial fibrillation (HCC) 05/29/2019   ESRD (end stage renal disease) (HCC) 05/29/2019   Cerebral thrombosis with cerebral infarction 05/22/2019   Malnutrition of moderate degree 05/19/2019   Pleural effusion 05/18/2019   Atrial fibrillation with RVR (HCC) 05/18/2019   S/P aortic aneurysm repair 04/07/2019   Aortic dissection (HCC) 04/04/2019   Dissection of aorta (HCC) 04/03/2019   Non compliance with medical treatment 12/04/2017   Chronic obstructive lung disease (HCC) 01/16/2017   Chronic low back pain 06/22/2016   Insomnia 03/14/2015   S/P lumbar spinal fusion 01/18/2014   Tobacco use disorder 04/19/2009   GERD 09/16/2006   PCP:  Fleet Contras, MD Pharmacy:   [facility] - [place], [place] - [address] [place] [address] Phone: [phone] Fax: [phone]     Social Determinants of Health (SDOH) Social History: SDOH Screenings   Food Insecurity: No Food Insecurity (01/28/2023)  Housing: Low Risk  (01/28/2023)  Transportation Needs: No Transportation Needs (01/28/2023)  Utilities: Not At Risk (01/28/2023)  Depression (PHQ2-9): Low Risk  (06/17/2022)  Recent Concern: Depression (PHQ2-9) - Medium Risk (05/22/2022)  Financial Resource Strain: Low Risk  (04/14/2021)   Received from [facility]  Physical Activity: Inactive (04/14/2021)   Received from [facility]  Social Connections: Unknown (09/28/2021)   Received from [facility]   Stress: Stress Concern Present (04/14/2021)   Received from [facility]  Tobacco Use: Medium Risk (01/14/2023)   SDOH Interventions:     Readmission Risk Interventions    01/15/2023   12:35 PM 01/09/2022    4:28 PM  Readmission Risk Prevention Plan  Transportation Screening Complete Complete  Medication Review (RN Care Manager) Referral to Pharmacy Complete  PCP or Specialist appointment within 3-5 days of discharge Complete Complete  HRI or Home Care Consult Complete Complete  SW Recovery Care/Counseling Consult Complete Complete  Palliative Care Screening Not Applicable Complete  Skilled Nursing Facility Not Applicable Not Applicable

## 2023-01-29 NOTE — Plan of Care (Signed)

## 2023-01-30 DIAGNOSIS — J9601 Acute respiratory failure with hypoxia: Secondary | ICD-10-CM | POA: Diagnosis not present

## 2023-01-30 LAB — CBC
HCT: 27.3 % — ABNORMAL LOW (ref 36.0–46.0)
Hemoglobin: 8.5 g/dL — ABNORMAL LOW (ref 12.0–15.0)
MCH: 28.6 pg (ref 26.0–34.0)
MCHC: 31.1 g/dL (ref 30.0–36.0)
MCV: 91.9 fL (ref 80.0–100.0)
Platelets: 175 10*3/uL (ref 150–400)
RBC: 2.97 MIL/uL — ABNORMAL LOW (ref 3.87–5.11)
RDW: 18.3 % — ABNORMAL HIGH (ref 11.5–15.5)
WBC: 4.9 10*3/uL (ref 4.0–10.5)
nRBC: 0 % (ref 0.0–0.2)

## 2023-01-30 LAB — HEPATITIS B SURFACE ANTIBODY, QUANTITATIVE: Hep B S AB Quant (Post): 51.6 m[IU]/mL

## 2023-01-30 LAB — MAGNESIUM: Magnesium: 2 mg/dL (ref 1.7–2.4)

## 2023-01-30 LAB — RENAL FUNCTION PANEL
Albumin: 3.1 g/dL — ABNORMAL LOW (ref 3.5–5.0)
Anion gap: 16 — ABNORMAL HIGH (ref 5–15)
BUN: 77 mg/dL — ABNORMAL HIGH (ref 8–23)
CO2: 23 mmol/L (ref 22–32)
Calcium: 8.3 mg/dL — ABNORMAL LOW (ref 8.9–10.3)
Chloride: 98 mmol/L (ref 98–111)
Creatinine, Ser: 10.54 mg/dL — ABNORMAL HIGH (ref 0.44–1.00)
GFR, Estimated: 4 mL/min — ABNORMAL LOW (ref >60)
Glucose, Bld: 112 mg/dL — ABNORMAL HIGH (ref 70–99)
Phosphorus: 6.8 mg/dL — ABNORMAL HIGH (ref 2.5–4.6)
Potassium: 4.4 mmol/L (ref 3.5–5.1)
Sodium: 137 mmol/L (ref 135–145)

## 2023-01-30 MED ORDER — HEPARIN SODIUM (PORCINE) 1000 UNIT/ML IJ SOLN
INTRAMUSCULAR | Status: AC
  Administered 2023-01-30: 3800 [IU]
  Filled 2023-01-30: qty 4

## 2023-01-30 NOTE — Procedures (Signed)
Seen and examined on dialysis.  Procedure supervised.  RIJ tunn catheter in use.  Blood pressure 150/81 and HR 74.  Tolerating goal.    Discussed compliance with dialysis, fluid restrictions (and not eating ice as this is essentially fluid), and low potassium diet.   Estanislado Emms, MD 01/30/2023  8:56 AM

## 2023-01-30 NOTE — Discharge Planning (Signed)
[facility] Patient Discharge Orders- [facility] CLINIC: [facility]  Patient's name: SHILPA SPOONEMORE Admit/DC Dates: 01/27/2023 - 01/30/23  Discharge Diagnoses: Acute hypoxic resp failure/pulmonary edema 2/2 missed HD  Hyperkalemia hypertension   Aranesp: Given: yes   Date and amount of last dose: on 01/29/23  Last Hgb: 8.5 PRBC's Given: no  ESA dose for discharge: mircera 100 mcg IV q 2 weeks - next 9/5 IV Iron dose at discharge: no  Heparin change: no  EDW Change: no New EDW:   Bath Change: no  Access intervention/Change: no Details:  Hectorol/Calcitriol change: no  Discharge Labs: Calcium 8.3 Phosphorus 6.8 Albumin 3.1 K+ 4.4  IV Antibiotics: no Details:  On Coumadin?: no Last INR: Next INR: Managed By:   OTHER/APPTS/LAB ORDERS:    D/C Meds to be reconciled by nurse after every discharge.  Completed By:   Reviewed by: MD:______ RN_______  Virgina Norfolk, PA-C [facility]

## 2023-01-30 NOTE — Procedures (Signed)
HD Note:  Some information was entered later than the data was gathered due to patient care needs. The stated time with the data is accurate.  Received patient in bed to unit.   Alert and oriented.   Informed consent signed and in chart.   Access used: Right upper chest HD catheter Access issues: None  Patient tolerated treatment well.   TX duration: 3.5  Alert, without acute distress.  Total UF removed: 2400 ml  Hand-off given to patient's nurse.   Transported back to the room   Sharmayne Jablon L. Dareen Piano, RN [facility].

## 2023-01-30 NOTE — Progress Notes (Addendum)
[facility] Progress Note   Subjective:   Patient seen and examined at bedside in dialysis.  Denies CP, SOB, abdominal pain and n/v/d.  Complains of pain to L eye starting this AM.  Denies drainage from eye, fever or chills.   Objective Vitals:   01/30/23 1030 01/30/23 1100 01/30/23 1130 01/30/23 1200  BP: (!) 140/78 (!) 122/96 (!) 170/101 (!) 167/88  Pulse: 70 74 70 71  Resp: (!) 24 (!) 30 20 (!) 29  Temp:      TempSrc:      SpO2: 100% 100% 100% 100%  Weight:      Height:       Physical Exam General:chronically ill appearing female in NAD HEENT: L eyelid edema Heart:RRR, +2/6 systolic murmur Lungs:CTAB, nml WOB on RA Abdomen:soft, NTND Extremities:no LE edema Dialysis Access: [facility]   Filed Weights   01/29/23 0538 01/29/23 1638 01/30/23 0648  Weight: 48.5 kg 48.5 kg 50.8 kg   No intake or output data in the 24 hours ending 01/30/23 1210  Additional Objective Labs: Basic Metabolic Panel: Recent Labs  Lab 01/27/23 2312 01/27/23 2318 01/28/23 0119 01/28/23 1429 01/29/23 0317 01/30/23 0734  NA 144 139  139  --   --  137 137  K 7.0* 6.7*  6.7*   < > 4.1 4.3 4.4  CL 101 107  --   --  96* 98  CO2 18*  --   --   --  25 23  GLUCOSE 117* 115*  --   --  98 112*  BUN 87* 81*  --   --  52* 77*  CREATININE 12.90* 14.20*  --   --  8.44* 10.54*  CALCIUM 9.1  --   --   --  8.9 8.3*  PHOS  --   --   --   --  5.4* 6.8*   < > = values in this interval not displayed.   Liver Function Tests: Recent Labs  Lab 01/27/23 2312 01/30/23 0734  AST 27  --   ALT 16  --   ALKPHOS 78  --   BILITOT 0.4  --   PROT 7.5  --   ALBUMIN 4.0 3.1*   CBC: Recent Labs  Lab 01/27/23 2312 01/27/23 2318 01/29/23 0317 01/30/23 0734  WBC 6.9  --  4.7 4.9  NEUTROABS 5.7  --   --   --   HGB 9.4* 10.5*  10.5* 8.3* 8.5*  HCT 30.6* 31.0*  31.0* 26.6* 27.3*  MCV 94.7  --  94.7 91.9  PLT 200  --  162 175    Medications:  sodium chloride Stopped (01/28/23 0559)    anticoagulant sodium citrate      amLODipine  10 mg Oral Daily   apixaban  2.5 mg Oral BID   carvedilol  25 mg Oral BID WC   Chlorhexidine Gluconate Cloth  6 each Topical Q0600   cloNIDine  0.1 mg Oral BID   darbepoetin (ARANESP) injection - NON-DIALYSIS  100 mcg Subcutaneous Q Fri-1800   heparin sodium (porcine)       hydrALAZINE  100 mg Oral Q8H   isosorbide mononitrate  30 mg Oral Daily   pantoprazole  40 mg Oral Daily   rosuvastatin  10 mg Oral QHS    Dialysis Orders: 3:30hr, 400/A1.5, EDW 45.5kg, 2K/2Ca bath, TDC, UFP #2, no heparin - Last HD 8/24 - left at 47.1kg -> missed HD on 8/27 - Mircera IV q 2 weeks - last  8/13 - Venofer 50mg  IV weekly - Hectoral IV q HD   Assessment/Plan:  Acute Hypoxic RF/pulm edema: resolved with HD. On RA. UF as tolerated. Hyperkalemia: resolved post HD, change to renal diet. K 4.4.  HTN emergency: Required NTG drip. Improved today but remains elevated, continue home meds.  ESRD:  Usual TTS schedule. Hx non compliance.  HD today per regular schedule.   Anemia: Hgb 8.5. Aranesp given yesterday.  Metabolic bone disease: Ca and phos in goal.  Continue binders and VDRA.  A-fib  HFrEF   Dispo - to d/c home today  Monique Norfolk, PA-C [facility] 01/30/2023,12:10 PM  LOS: 2 days    Seen and examined independently.  Agree with note and exam as documented above by physician extender and as noted here.  Please see procedure note from today  Monique Emms, MD 01/30/2023  1:51 PM

## 2023-01-30 NOTE — Discharge Summary (Signed)
Physician Discharge Summary  Monique Henderson ZOX:[id number] DOB: [birth year] DOA: 01/27/2023  PCP: Fleet Contras, MD  Admit date: 01/27/2023 Discharge date: 01/30/2023  Admitted From: Home Disposition: Home  Recommendations for Outpatient Follow-up:  Follow up with PCP in 1-2 weeks Continue to encourage compliance with regularly scheduled hemodialysis and antihypertensive medications  Home Health: No Equipment/Devices: None  Discharge Condition: Stable CODE STATUS: Full code Diet recommendation: Renal diet  History of present illness:  Monique Henderson is a 72 y.o. female with past medical history significant for ESRD on HD TTS, paroxysmal atrial fibrillation on Eliquis, chronic systolic congestive heart failure, COPD, hepatitis C, HTN who presented to [facility] ED on 8/29 with respiratory distress after missing hemodialysis.  Patient reports missed her HD on 8/27 due to transportation issues.  Send she has had increased work of breathing and called EMS on 8/28; and subsequently was placed on CPAP with noted SpO2 77% on room air; and subsequently transported to the ED via EMS for further evaluation management.   Patient recently admitted on 8/15 with similar admission after missing HD and having respiratory distress. She has a documented history of medical/HD noncompliance. Patient states that she missed her dialysis on 8/27. She states that she had no way to get to her dialysis session.    In the ED, temperature 99.2 F, HR 93, RR 24, BP 212/134, SpO2 100% on BiPAP.  WBC 6.9, hemoglobin 9.4, platelet count 200.  Sodium 144, potassium 7.0, chloride 101, CO2 18, glucose 117, BUN 87, creatinine 12.90.  BNP greater than 4500.  High sensitive troponin 75 followed by 76.  EtOH level less than 10.  Chest x-ray with cardiac enlargement, perihilar infiltrates with mild progression since prior study.  PCCM was consulted and patient was admitted to the intensive care unit on a  nitroglycerin drip with nephrology in consult.   Significant Hospital events: 8/28: Admitted to the PCCM service, on nitroglycerin drip, on BiPAP, nephrology consulted for hemodialysis 8/29: Nitroglycerin drip weaned off, weaned off of BiPAP 8/30: Transferred to hospitalist service, nephrology plans HD tomorrow 8/31: HD today on normal schedule, discharging home  Hospital course:  Acute hypoxic respiratory failure, POA Acute on chronic systolic congestive heart failure Flash pulmonary edema Patient presenting to ED via EMS with progressive shortness of breath in the setting of missed hemodialysis and hypertensive emergency.  Patient was noted to have SpO2 77% on room air and placed on CPAP/BiPAP.  BNP elevated greater than 4500.  Chest x-ray with cardiac enlargement perihilar infiltrate consistent with pulmonary edema.  Patient was initially admitted to the [facility] service and placed on a nitroglycerin drip.  Nephrology was consulted and patient underwent hemodialysis improvement of her respiratory status and now tried titrated off of supplemental oxygen.  Continue encourage compliance with regular scheduled hemodialysis sessions and home medications.   Hypertensive emergency Hx essential hypertension On arrival BP noted to be 212/134.  Etiology likely secondary to volume overload, medical noncompliance.  Patient initially was placed on a nitroglycerin drip which was titrated off after reinitiation of her home antihypertensives.  Continue carvedilol 25 mg p.o. twice daily, Clonidine 0.1 mg p.o. twice daily, Hydralazine 100 mg p.o. every 8 hours,  Imdur 30 mg p.o. daily.  Continue to encourage compliance   Troponin likely secondary to type II demand ischemia Troponin elevated 75 followed by 76.  Denies chest pain.  Etiology likely secondary to type II demand ischemia in the setting of severe volume overload, hypertensive emergency as above.  Hyperkalemia: Resolved Potassium 7.0 on admission,  likely secondary to missed hemodialysis.  Treated in the ED with sodium bicarbonate, albuterol neb, IV insulin and patient underwent hemodialysis with resolution.   ESRD on HD TTS Nephrology following for continued hemodialysis while inpatient. Pt receives out-pt HD at [facility] on TTS. Pt should arrive 11:25-11:30 for 11:45 chair time. Clinic staff reports that pt uses [facility] and family to transport to/from HD.  Social work did verify with [facility] that services are available.   Paroxysmal atrial fibrillation Continue carvedilol 25 mg p.o. twice daily, Eliquis 2.5 mg p.o. twice daily   Hyperlipidemia Crestor 10 mg p.o. nightly   COPD Oxygen now titrated off; ikely secondary to volume overload as above   GERD Protonix 40 mg p.o. daily  Discharge Diagnoses:  Principal Problem:   Acute respiratory failure with hypoxia [facility])    Discharge Instructions  Discharge Instructions     Call MD for:  difficulty breathing, headache or visual disturbances   Complete by: As directed    Call MD for:  extreme fatigue   Complete by: As directed    Call MD for:  persistant dizziness or light-headedness   Complete by: As directed    Call MD for:  persistant nausea and vomiting   Complete by: As directed    Call MD for:  severe uncontrolled pain   Complete by: As directed    Call MD for:  temperature >100.4   Complete by: As directed    Diet - low sodium heart healthy   Complete by: As directed    Increase activity slowly   Complete by: As directed    No wound care   Complete by: As directed       Allergies as of 01/30/2023       Reactions   Shrimp [shellfish Allergy] Shortness Of Breath   Bactroban [mupirocin] Other (See Comments)   "Sores in nose"   Hydrocodone Itching, Nausea Only, Nausea And Vomiting   Chlorhexidine Gluconate Itching   Tylenol [acetaminophen] Itching   Zestril [lisinopril] Cough        Medication List     STOP taking these medications     traZODone 50 MG tablet Commonly known as: DESYREL       TAKE these medications    albuterol 108 (90 Base) MCG/ACT inhaler Commonly known as: VENTOLIN HFA Inhale 2 puffs into the lungs every 6 (six) hours as needed for wheezing or shortness of breath.   amLODipine 10 MG tablet Commonly known as: NORVASC Take 1 tablet (10 mg total) by mouth daily.   apixaban 2.5 MG Tabs tablet Commonly known as: ELIQUIS Take 1 tablet (2.5 mg total) by mouth 2 (two) times daily.   carvedilol 25 MG tablet Commonly known as: COREG Take 1 tablet (25 mg total) by mouth 2 (two) times daily with a meal.   cloNIDine 0.1 MG tablet Commonly known as: Catapres Take 1 tablet (0.1 mg total) by mouth 2 (two) times daily.   hydrALAZINE 100 MG tablet Commonly known as: APRESOLINE Take 1 tablet (100 mg total) by mouth every 8 (eight) hours.   isosorbide mononitrate 30 MG 24 hr tablet Commonly known as: IMDUR TAKE 1 TABLET (30MG ) ON DIALYSIS DAYS AND 2 TABLETS (60 MG) ON NON DIALYSIS DAYS.   naloxone 4 MG/0.1ML Liqd nasal spray kit Commonly known as: NARCAN 1 spray as directed.   oxyCODONE-acetaminophen 10-325 MG tablet Commonly known as: PERCOCET Take 1 tablet by mouth 3 (  three) times daily as needed for pain.   pantoprazole 40 MG tablet Commonly known as: PROTONIX TAKE 1 TABLET (40 MG TOTAL) BY MOUTH 2 (TWO) TIMES DAILY. What changed: when to take this   rosuvastatin 10 MG tablet Commonly known as: CRESTOR TAKE 1 TABLET (10 MG TOTAL) BY MOUTH DAILY FOR CHOLESTEROL What changed: See the new instructions.   senna-docusate 8.6-50 MG tablet Commonly known as: Senokot-S Take 1 tablet by mouth 2 (two) times daily between meals as needed for mild constipation.   sevelamer carbonate 2.4 g Pack Commonly known as: RENVELA Take 2.4 g by mouth 3 (three) times daily with meals.   VITAMIN D-3 PO Take 1 capsule by mouth daily.        Follow-up Information     Fleet Contras, MD. Schedule an  appointment as soon as possible for a visit in 1 week(s).   Specialty: Internal Medicine Contact information: [address] [place] [address] [phone]                Allergies  Allergen Reactions   Shrimp [Shellfish Allergy] Shortness Of Breath   Bactroban [Mupirocin] Other (See Comments)    "Sores in nose"   Hydrocodone Itching, Nausea Only and Nausea And Vomiting   Chlorhexidine Gluconate Itching   Tylenol [Acetaminophen] Itching   Zestril [Lisinopril] Cough    Consultations: PCCM Nephrology   Procedures/Studies: DG Chest Portable 1 View  Result Date: 01/27/2023 CLINICAL DATA:  Shortness of breath EXAM: PORTABLE CHEST 1 VIEW COMPARISON:  01/14/2023 FINDINGS: Postoperative changes in the mediastinum. Right central venous catheter with tip over the cavoatrial junction region. Cardiac enlargement. Mild perihilar infiltration, likely edema or pneumonia. This is mildly progressed since prior study. No pleural effusions. No pneumothorax. Calcification of the aorta. IMPRESSION: Cardiac enlargement. Perihilar infiltrates with mild progression since prior study. Electronically Signed   By: Burman Nieves M.D.   On: 01/27/2023 23:32   CT ANGIO CHEST/ABD/PEL FOR DISSECTION W &/OR WO CONTRAST  Result Date: 01/14/2023 CLINICAL DATA:  Acute aortic syndrome suspected chest pain EXAM: CT ANGIOGRAPHY CHEST, ABDOMEN AND PELVIS TECHNIQUE: Non-contrast CT of the chest was initially obtained. Multidetector CT imaging through the chest, abdomen and pelvis was performed using the standard protocol during bolus administration of intravenous contrast. Multiplanar reconstructed images and MIPs were obtained and reviewed to evaluate the vascular anatomy. RADIATION DOSE REDUCTION: This exam was performed according to the departmental dose-optimization program which includes automated exposure control, adjustment of the mA and/or kV according to patient size and/or use of iterative  reconstruction technique. CONTRAST:  75mL OMNIPAQUE IOHEXOL 350 MG/ML SOLN COMPARISON:  01/29/2022 FINDINGS: CTA CHEST FINDINGS VASCULAR Aorta: Satisfactory opacification of the aorta. 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 No evidence of aneurysm, dissection, or other acute aortic pathology. Severe mixed aortic atherosclerosis. Large-bore right neck multi lumen vascular catheter Cardiovascular: No evidence of pulmonary embolism on limited non-tailored examination. Cardiomegaly. No pericardial effusion. Review of the MIP images confirms the above findings. NON VASCULAR Mediastinum/Nodes: No enlarged mediastinal, hilar, or axillary lymph nodes. Thyroid gland, trachea, and esophagus demonstrate no significant findings. Lungs/Pleura: Moderate right, small left pleural effusions and associated atelectasis or consolidation. Musculoskeletal: No chest wall abnormality. No acute osseous findings. Review of the MIP images confirms the above findings. CTA ABDOMEN AND PELVIS FINDINGS VASCULAR Normal contour and caliber of the abdominal aorta. No evidence of aneurysm, dissection, or other acute aortic pathology. Standard branching pattern of  the abdominal aorta with solitary bilateral renal arteries. Severe mixed aortic atherosclerosis. Review of the MIP images confirms the above findings. NON-VASCULAR Hepatobiliary: No solid liver abnormality is seen. No gallstones, gallbladder wall thickening, or biliary dilatation. Pancreas: Unremarkable. No pancreatic ductal dilatation or surrounding inflammatory changes. Spleen: Normal in size without significant abnormality. Adrenals/Urinary Tract: Adrenal glands are unremarkable. Atrophic kidneys. Small benign, intrinsically hyperdense lesion of the posterior superior pole of the left kidney unchanged, consistent with a small hemorrhagic or proteinaceous cyst, requiring no specific further follow-up or  characterization (series 5, image 159). No calculi or hydronephrosis. Bladder is unremarkable. Stomach/Bowel: Stomach is within normal limits. Appendix appears normal. No evidence of bowel wall thickening, distention, or inflammatory changes. Sigmoid diverticulosis. Lymphatic: No enlarged abdominal or pelvic lymph nodes. Reproductive: Calcified uterine fibroids. Other: No abdominal wall hernia or abnormality. Small volume ascites throughout the abdomen and pelvis. Musculoskeletal: No acute osseous findings. Renal osteodystrophy. Unchanged fatty herniations of the left flank (series 5, image 176). IMPRESSION: 1. 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 No evidence of aneurysm, dissection, or other acute aortic pathology. 2. Severe mixed aortic atherosclerosis. 3. Moderate right, small left pleural effusions and associated atelectasis or consolidation. 4. Small volume ascites throughout the abdomen and pelvis. 5. Sigmoid diverticulosis without evidence of acute diverticulitis. Aortic Atherosclerosis (ICD10-I70.0). Electronically Signed   By: Jearld Lesch M.D.   On: 01/14/2023 19:32   DG Chest Portable 1 View  Result Date: 01/14/2023 CLINICAL DATA:  Shortness of breath EXAM: PORTABLE CHEST 1 VIEW COMPARISON:  09/25/2022 FINDINGS: Post sternotomy changes. Cardiomegaly with probable small pleural effusions. Vascular congestion and diffuse interstitial and ground-glass opacity consistent with pulmonary edema. Right-sided central venous catheter tip over the right atrium. Aortic atherosclerosis IMPRESSION: Cardiomegaly with vascular congestion and pulmonary edema. Probable small pleural effusions. Electronically Signed   By: Jasmine Pang M.D.   On: 01/14/2023 17:22     Subjective: Patient seen examined bedside, resting calmly.  Currently receiving hemodialysis.  No specific complaints.  Feels well and back to her baseline.  Ready for discharge  home after dialysis today.  Denies headache, no dizziness, no chest pain, no palpitations, no shortness of breath, no abdominal pain, no fever/chills/night sweats, no nausea cefonicid diarrhea, no abdominal pain, no focal weakness, no fatigue, no paresthesias.  No acute events overnight per nursing staff.  Discharge Exam: Vitals:   01/30/23 1100 01/30/23 1130  BP: (!) 122/96 (!) 170/101  Pulse: 74 70  Resp: (!) 30 20  Temp:    SpO2: 100% 100%   Vitals:   01/30/23 1000 01/30/23 1030 01/30/23 1100 01/30/23 1130  BP: (!) 145/82 (!) 140/78 (!) 122/96 (!) 170/101  Pulse: 73 70 74 70  Resp: (!) 25 (!) 24 (!) 30 20  Temp:      TempSrc:      SpO2: 100% 100% 100% 100%  Weight:      Height:        Physical Exam: GEN: NAD, alert and oriented x 3, chronically ill/thin/cachectic in appearance, appears older than stated age HEENT: NCAT, PERRL, EOMI, sclera clear, MMM PULM: CTAB w/o wheezes/crackles, normal respiratory effort, on room air CV: RRR w/o M/G/R GI: abd soft, NTND, NABS MSK: no peripheral edema, moves all extremities independently NEURO: No focal neurological deficits PSYCH: normal mood/affect Integumentary: dry/intact, no rashes or wounds    The results of significant diagnostics from this hospitalization (including imaging, microbiology, ancillary and laboratory) are  listed below for reference.     Microbiology: Recent Results (from the past 240 hour(s))  MRSA Next Gen by PCR, Nasal     Status: Abnormal   Collection Time: 01/28/23  4:03 AM   Specimen: Nasal Mucosa; Nasal Swab  Result Value Ref Range Status   MRSA by PCR Next Gen DETECTED (A) NOT DETECTED Final    Comment: RESULTS CALLED TO, READ BACK BY AND VERIFIED WITH RN S.DAVIS ON 01/28/23 AT 0532 BY NM (NOTE) The GeneXpert MRSA Assay (FDA approved for NASAL specimens only), is one component of a comprehensive MRSA colonization surveillance program. It is not intended to diagnose MRSA infection nor to guide or  monitor treatment for MRSA infections. Test performance is not FDA approved in patients less than 49 years old. Performed at [facility], [address], [place], [place] [address]      Labs: BNP (last 3 results) Recent Labs    08/11/22 0934 01/14/23 1545 01/27/23 2306  BNP >4,500.0* >4,500.0* >4,500.0*   Basic Metabolic Panel: Recent Labs  Lab 01/27/23 2312 01/27/23 2318 01/28/23 0119 01/28/23 1429 01/29/23 0317 01/30/23 0734  NA 144 139  139  --   --  137 137  K 7.0* 6.7*  6.7* 5.8* 4.1 4.3 4.4  CL 101 107  --   --  96* 98  CO2 18*  --   --   --  25 23  GLUCOSE 117* 115*  --   --  98 112*  BUN 87* 81*  --   --  52* 77*  CREATININE 12.90* 14.20*  --   --  8.44* 10.54*  CALCIUM 9.1  --   --   --  8.9 8.3*  MG 2.5*  --   --   --  2.0 2.0  PHOS  --   --   --   --  5.4* 6.8*   Liver Function Tests: Recent Labs  Lab 01/27/23 2312 01/30/23 0734  AST 27  --   ALT 16  --   ALKPHOS 78  --   BILITOT 0.4  --   PROT 7.5  --   ALBUMIN 4.0 3.1*   No results for input(s): "LIPASE", "AMYLASE" in the last 168 hours. No results for input(s): "AMMONIA" in the last 168 hours. CBC: Recent Labs  Lab 01/27/23 2312 01/27/23 2318 01/29/23 0317 01/30/23 0734  WBC 6.9  --  4.7 4.9  NEUTROABS 5.7  --   --   --   HGB 9.4* 10.5*  10.5* 8.3* 8.5*  HCT 30.6* 31.0*  31.0* 26.6* 27.3*  MCV 94.7  --  94.7 91.9  PLT 200  --  162 175   Cardiac Enzymes: No results for input(s): "CKTOTAL", "CKMB", "CKMBINDEX", "TROPONINI" in the last 168 hours. BNP: Invalid input(s): "POCBNP" CBG: Recent Labs  Lab 01/28/23 0900 01/28/23 1103 01/28/23 1429 01/28/23 1643 01/28/23 1806  GLUCAP 61* 107* 144* 79 98   D-Dimer No results for input(s): "DDIMER" in the last 72 hours. Hgb A1c No results for input(s): "HGBA1C" in the last 72 hours. Lipid Profile Recent Labs    01/29/23 0317  CHOL 104  HDL 49  LDLCALC 46  TRIG 46  CHOLHDL 2.1   Thyroid function studies No results  for input(s): "TSH", "T4TOTAL", "T3FREE", "THYROIDAB" in the last 72 hours.  Invalid input(s): "FREET3" Anemia work up No results for input(s): "VITAMINB12", "FOLATE", "FERRITIN", "TIBC", "IRON", "RETICCTPCT" in the last 72 hours. Urinalysis    Component Value Date/Time  COLORURINE AMBER (A) 12/27/2021 1840   APPEARANCEUR TURBID (A) 12/27/2021 1840   LABSPEC 1.013 12/27/2021 1840   PHURINE 8.0 12/27/2021 1840   GLUCOSEU NEGATIVE 12/27/2021 1840   GLUCOSEU NEG mg/dL 62/95/2841 3244   HGBUR MODERATE (A) 12/27/2021 1840   BILIRUBINUR NEGATIVE 12/27/2021 1840   BILIRUBINUR negative 05/04/2018 0910   KETONESUR NEGATIVE 12/27/2021 1840   PROTEINUR >=300 (A) 12/27/2021 1840   UROBILINOGEN 0.2 05/04/2018 0910   UROBILINOGEN 1 02/14/2014 0946   NITRITE NEGATIVE 12/27/2021 1840   LEUKOCYTESUR LARGE (A) 12/27/2021 1840   Sepsis Labs Recent Labs  Lab 01/27/23 2312 01/29/23 0317 01/30/23 0734  WBC 6.9 4.7 4.9   Microbiology Recent Results (from the past 240 hour(s))  MRSA Next Gen by PCR, Nasal     Status: Abnormal   Collection Time: 01/28/23  4:03 AM   Specimen: Nasal Mucosa; Nasal Swab  Result Value Ref Range Status   MRSA by PCR Next Gen DETECTED (A) NOT DETECTED Final    Comment: RESULTS CALLED TO, READ BACK BY AND VERIFIED WITH RN S.DAVIS ON 01/28/23 AT 0532 BY NM (NOTE) The GeneXpert MRSA Assay (FDA approved for NASAL specimens only), is one component of a comprehensive MRSA colonization surveillance program. It is not intended to diagnose MRSA infection nor to guide or monitor treatment for MRSA infections. Test performance is not FDA approved in patients less than 63 years old. Performed at [facility], [address] [place], [place] [address]      Time coordinating discharge: Over 30 minutes  SIGNED:   Alvira Philips [place], DO  [facility] 01/30/2023, 11:41 AM

## 2023-01-30 NOTE — Plan of Care (Signed)
  Problem: Safety: Goal: Ability to remain free from injury will improve Outcome: Progressing   

## 2023-01-31 ENCOUNTER — Telehealth: Payer: Self-pay | Admitting: Nephrology

## 2023-01-31 NOTE — Telephone Encounter (Signed)
Transition of Care Contact from Inpatient Facility   Date of Discharge: 01/30/23 Date of Contact: 01/31/23 Method of contact: phone Talked to patient   Patient contacted to discuss transition of care form recent hospitaliztion. Patient was admitted to [facility] from 01/27/23 to 01/30/23 with the discharge diagnosis of Chest pain, pulmonary edema, hypertensive emergency and hyperkalemia.     Medication changes were reviewed.  Patient will follow up with is outpatient dialysis center 02/02/23.  Other follow up needs include none identified.   Virgina Norfolk, PA-C [employer]

## 2023-02-02 IMAGING — CR DG CHEST 2V
2 series · 2 of 2 positions shown · non-contrast
Comparison: 09/21/2020

CLINICAL DATA: Shortness of breath

EXAM:
CHEST - 2 VIEW

[chest lat]
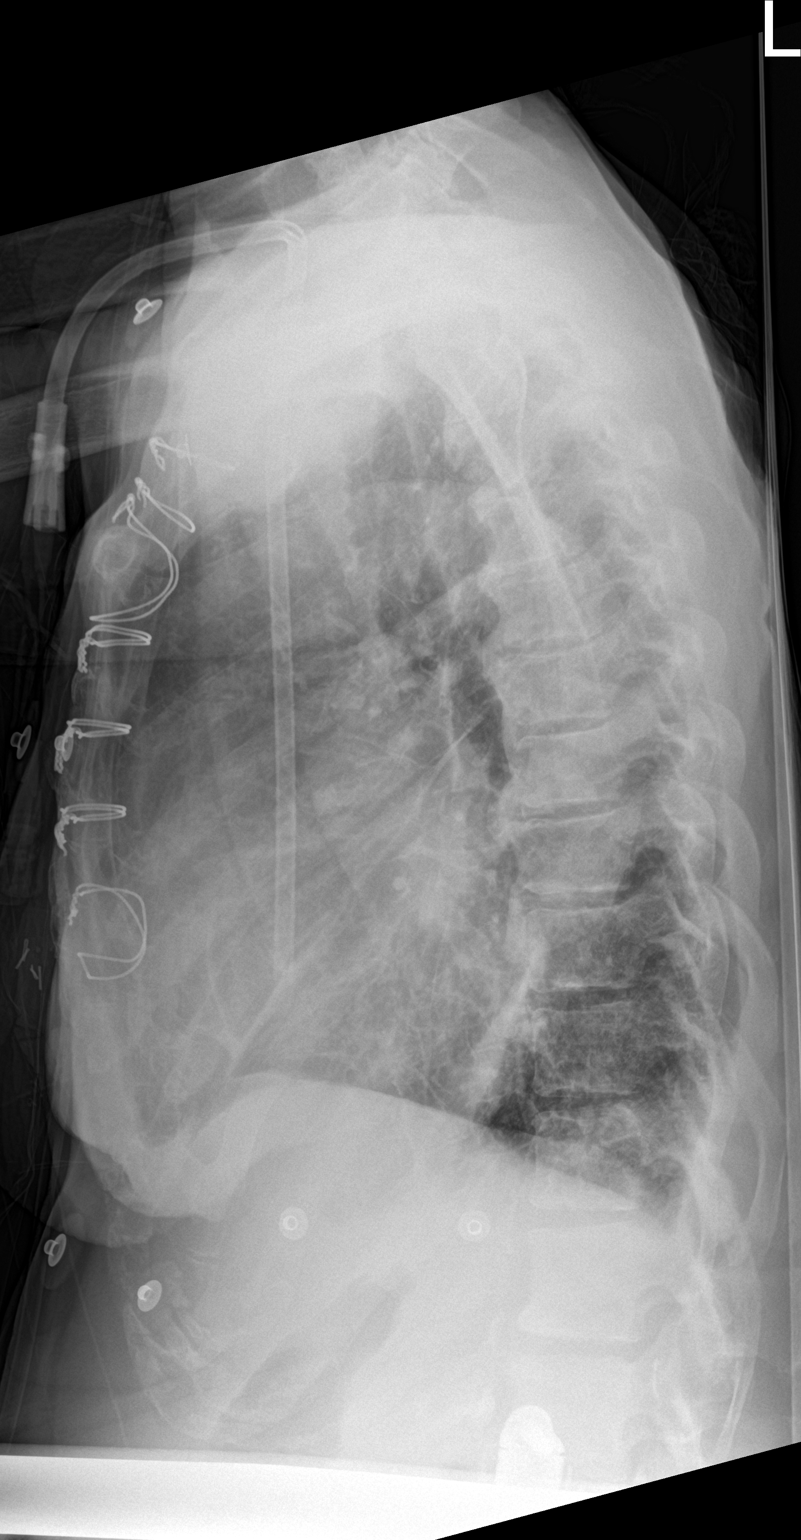

[chest ap]
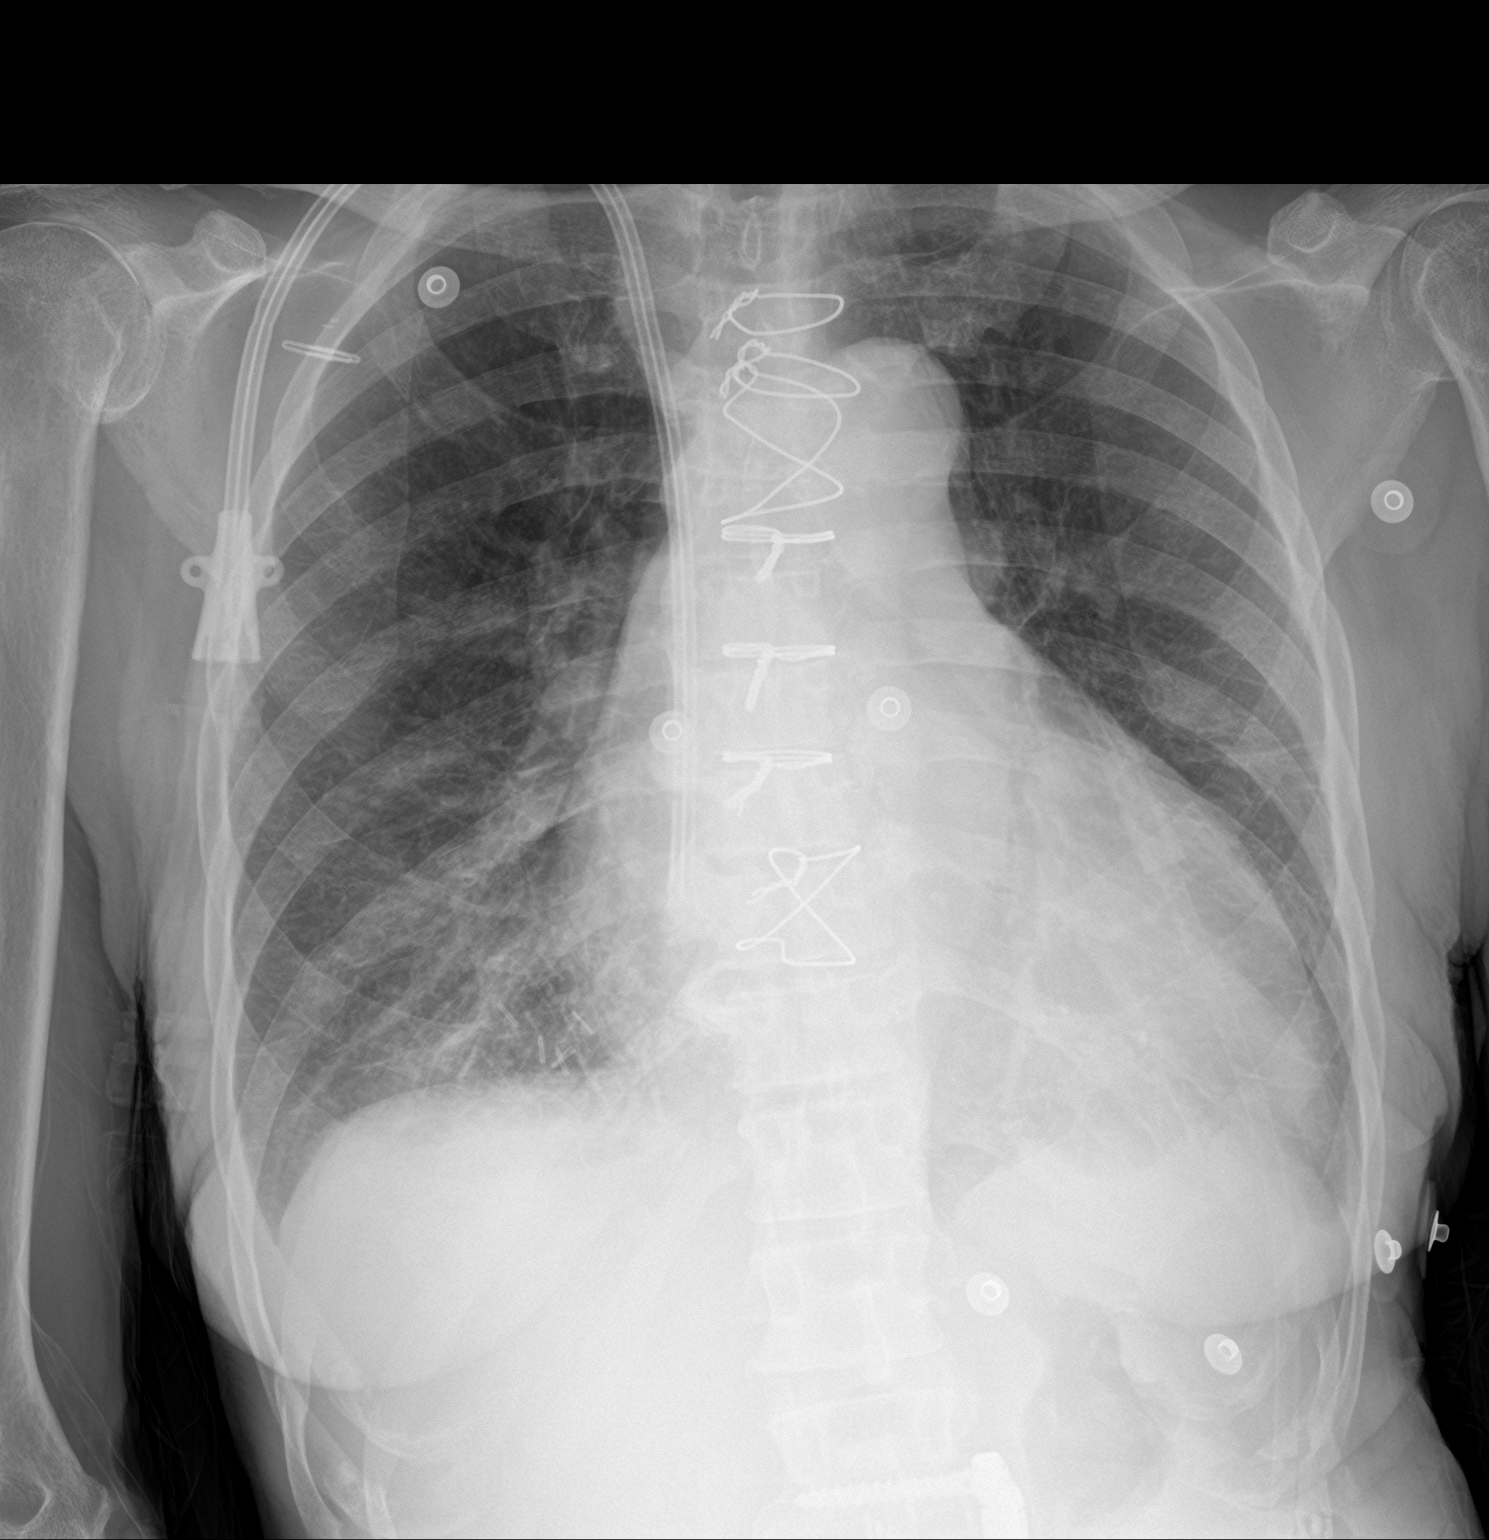

[2 of 2 positions shown; findings below may reference images not displayed]

FINDINGS: Right dialysis catheter in place with the tip in the right atrium.
Prior CABG. Cardiomegaly. Bibasilar opacities, likely atelectasis.
No effusions or edema.
IMPRESSION: Cardiomegaly.  Bibasilar atelectasis.

## 2023-02-02 NOTE — Progress Notes (Signed)
Late Note Entry- February 02, 2023  Pt was d/c on Saturday. Contacted [facility] this morning to advise clinic of pt's d/c date and that pt should resume care today.   Olivia Canter Renal Navigator [phone]

## 2023-04-02 ENCOUNTER — Emergency Department (HOSPITAL_COMMUNITY): Payer: Medicare Other

## 2023-04-02 ENCOUNTER — Other Ambulatory Visit: Payer: Self-pay

## 2023-04-02 ENCOUNTER — Encounter (HOSPITAL_COMMUNITY): Payer: Self-pay

## 2023-04-02 ENCOUNTER — Observation Stay (HOSPITAL_COMMUNITY)
Admission: EM | Admit: 2023-04-02 | Discharge: 2023-04-03 | Disposition: A | Discharge status: Home / Self Care | Payer: Medicare Other | Attending: Internal Medicine | Admitting: Internal Medicine

## 2023-04-02 DIAGNOSIS — Z853 Personal history of malignant neoplasm of breast: Secondary | ICD-10-CM | POA: Diagnosis not present

## 2023-04-02 DIAGNOSIS — J9600 Acute respiratory failure, unspecified whether with hypoxia or hypercapnia: Secondary | ICD-10-CM | POA: Diagnosis present

## 2023-04-02 DIAGNOSIS — I5022 Chronic systolic (congestive) heart failure: Secondary | ICD-10-CM | POA: Insufficient documentation

## 2023-04-02 DIAGNOSIS — I480 Paroxysmal atrial fibrillation: Secondary | ICD-10-CM | POA: Diagnosis not present

## 2023-04-02 DIAGNOSIS — J810 Acute pulmonary edema: Secondary | ICD-10-CM | POA: Diagnosis not present

## 2023-04-02 DIAGNOSIS — M545 Low back pain, unspecified: Secondary | ICD-10-CM | POA: Insufficient documentation

## 2023-04-02 DIAGNOSIS — N186 End stage renal disease: Secondary | ICD-10-CM | POA: Diagnosis not present

## 2023-04-02 DIAGNOSIS — J449 Chronic obstructive pulmonary disease, unspecified: Secondary | ICD-10-CM | POA: Diagnosis present

## 2023-04-02 DIAGNOSIS — Z9889 Other specified postprocedural states: Secondary | ICD-10-CM | POA: Diagnosis not present

## 2023-04-02 DIAGNOSIS — Z8673 Personal history of transient ischemic attack (TIA), and cerebral infarction without residual deficits: Secondary | ICD-10-CM | POA: Insufficient documentation

## 2023-04-02 DIAGNOSIS — E8770 Fluid overload, unspecified: Secondary | ICD-10-CM

## 2023-04-02 DIAGNOSIS — I161 Hypertensive emergency: Secondary | ICD-10-CM | POA: Diagnosis not present

## 2023-04-02 DIAGNOSIS — I169 Hypertensive crisis, unspecified: Secondary | ICD-10-CM | POA: Diagnosis not present

## 2023-04-02 DIAGNOSIS — E8779 Other fluid overload: Secondary | ICD-10-CM | POA: Diagnosis not present

## 2023-04-02 DIAGNOSIS — Z79899 Other long term (current) drug therapy: Secondary | ICD-10-CM | POA: Insufficient documentation

## 2023-04-02 DIAGNOSIS — Z992 Dependence on renal dialysis: Secondary | ICD-10-CM | POA: Diagnosis not present

## 2023-04-02 DIAGNOSIS — I132 Hypertensive heart and chronic kidney disease with heart failure and with stage 5 chronic kidney disease, or end stage renal disease: Secondary | ICD-10-CM | POA: Diagnosis not present

## 2023-04-02 DIAGNOSIS — I2510 Atherosclerotic heart disease of native coronary artery without angina pectoris: Secondary | ICD-10-CM | POA: Diagnosis not present

## 2023-04-02 DIAGNOSIS — Z87891 Personal history of nicotine dependence: Secondary | ICD-10-CM | POA: Diagnosis not present

## 2023-04-02 DIAGNOSIS — G8929 Other chronic pain: Secondary | ICD-10-CM | POA: Diagnosis present

## 2023-04-02 DIAGNOSIS — R0602 Shortness of breath: Secondary | ICD-10-CM | POA: Diagnosis present

## 2023-04-02 DIAGNOSIS — Z7901 Long term (current) use of anticoagulants: Secondary | ICD-10-CM | POA: Insufficient documentation

## 2023-04-02 DIAGNOSIS — J9601 Acute respiratory failure with hypoxia: Principal | ICD-10-CM | POA: Insufficient documentation

## 2023-04-02 DIAGNOSIS — N289 Disorder of kidney and ureter, unspecified: Secondary | ICD-10-CM

## 2023-04-02 LAB — CBC WITH DIFFERENTIAL/PLATELET
Abs Immature Granulocytes: 0.03 10*3/uL (ref 0.00–0.07)
Basophils Absolute: 0.1 10*3/uL (ref 0.0–0.1)
Basophils Relative: 1 %
Eosinophils Absolute: 0.3 10*3/uL (ref 0.0–0.5)
Eosinophils Relative: 3 %
HCT: 29.3 % — ABNORMAL LOW (ref 36.0–46.0)
Hemoglobin: 9.1 g/dL — ABNORMAL LOW (ref 12.0–15.0)
Immature Granulocytes: 0 %
Lymphocytes Relative: 12 %
Lymphs Abs: 0.8 10*3/uL (ref 0.7–4.0)
MCH: 30.1 pg (ref 26.0–34.0)
MCHC: 31.1 g/dL (ref 30.0–36.0)
MCV: 97 fL (ref 80.0–100.0)
Monocytes Absolute: 0.6 10*3/uL (ref 0.1–1.0)
Monocytes Relative: 8 %
Neutro Abs: 5.5 10*3/uL (ref 1.7–7.7)
Neutrophils Relative %: 76 %
Platelets: 215 10*3/uL (ref 150–400)
RBC: 3.02 MIL/uL — ABNORMAL LOW (ref 3.87–5.11)
RDW: 16.6 % — ABNORMAL HIGH (ref 11.5–15.5)
WBC: 7.3 10*3/uL (ref 4.0–10.5)
nRBC: 0 % (ref 0.0–0.2)

## 2023-04-02 LAB — BASIC METABOLIC PANEL
Anion gap: 17 — ABNORMAL HIGH (ref 5–15)
Anion gap: 16 — ABNORMAL HIGH (ref 5–15)
BUN: 48 mg/dL — ABNORMAL HIGH (ref 8–23)
BUN: 52 mg/dL — ABNORMAL HIGH (ref 8–23)
CO2: 23 mmol/L (ref 22–32)
CO2: 25 mmol/L (ref 22–32)
Calcium: 9 mg/dL (ref 8.9–10.3)
Calcium: 8.9 mg/dL (ref 8.9–10.3)
Chloride: 99 mmol/L (ref 98–111)
Chloride: 97 mmol/L — ABNORMAL LOW (ref 98–111)
Creatinine, Ser: 9.87 mg/dL — ABNORMAL HIGH (ref 0.44–1.00)
Creatinine, Ser: 10.52 mg/dL — ABNORMAL HIGH (ref 0.44–1.00)
GFR, Estimated: 4 mL/min — ABNORMAL LOW (ref >60)
GFR, Estimated: 4 mL/min — ABNORMAL LOW (ref >60)
Glucose, Bld: 102 mg/dL — ABNORMAL HIGH (ref 70–99)
Glucose, Bld: 97 mg/dL (ref 70–99)
Potassium: 4.7 mmol/L (ref 3.5–5.1)
Potassium: 4.8 mmol/L (ref 3.5–5.1)
Sodium: 139 mmol/L (ref 135–145)
Sodium: 138 mmol/L (ref 135–145)

## 2023-04-02 LAB — CBC
HCT: 27.3 % — ABNORMAL LOW (ref 36.0–46.0)
Hemoglobin: 8.4 g/dL — ABNORMAL LOW (ref 12.0–15.0)
MCH: 29.8 pg (ref 26.0–34.0)
MCHC: 30.8 g/dL (ref 30.0–36.0)
MCV: 96.8 fL (ref 80.0–100.0)
Platelets: 209 10*3/uL (ref 150–400)
RBC: 2.82 MIL/uL — ABNORMAL LOW (ref 3.87–5.11)
RDW: 16.4 % — ABNORMAL HIGH (ref 11.5–15.5)
WBC: 6.9 10*3/uL (ref 4.0–10.5)
nRBC: 0 % (ref 0.0–0.2)

## 2023-04-02 LAB — ALBUMIN: Albumin: 3 g/dL — ABNORMAL LOW (ref 3.5–5.0)

## 2023-04-02 LAB — BRAIN NATRIURETIC PEPTIDE: B Natriuretic Peptide: >4500 pg/mL — ABNORMAL HIGH (ref 0.0–100.0)

## 2023-04-02 LAB — TROPONIN I (HIGH SENSITIVITY)
Troponin I (High Sensitivity): 51 ng/L — ABNORMAL HIGH (ref <18)
Troponin I (High Sensitivity): 63 ng/L — ABNORMAL HIGH (ref <18)

## 2023-04-02 LAB — PHOSPHORUS: Phosphorus: 6.6 mg/dL — ABNORMAL HIGH (ref 2.5–4.6)

## 2023-04-02 LAB — HEPATITIS B SURFACE ANTIGEN: Hepatitis B Surface Ag: NONREACTIVE

## 2023-04-02 MED ORDER — SEVELAMER CARBONATE 2.4 G PO PACK
2.4000 g | PACK | Freq: Three times a day (TID) | ORAL | Status: DC
  Administered 2023-04-03 (×3): 2.4 g via ORAL
  Filled 2023-04-02 (×7): qty 1

## 2023-04-02 MED ORDER — ALTEPLASE 2 MG IJ SOLR: 2.0000 mg | Freq: Once | INTRAMUSCULAR | Status: DC | PRN

## 2023-04-02 MED ORDER — FENTANYL CITRATE PF 50 MCG/ML IJ SOSY
25.0000 ug | PREFILLED_SYRINGE | INTRAMUSCULAR | Status: DC | PRN
  Administered 2023-04-02: 50 ug via INTRAVENOUS
  Filled 2023-04-02: qty 1

## 2023-04-02 MED ORDER — NEPRO/CARBSTEADY PO LIQD: 237.0000 mL | ORAL | Status: DC | PRN

## 2023-04-02 MED ORDER — AMLODIPINE BESYLATE 10 MG PO TABS
10.0000 mg | ORAL_TABLET | Freq: Every day | ORAL | Status: DC
  Administered 2023-04-02 – 2023-04-03 (×2): 10 mg via ORAL
  Filled 2023-04-02: qty 1
  Filled 2023-04-02: qty 2

## 2023-04-02 MED ORDER — NITROGLYCERIN IN D5W 200-5 MCG/ML-% IV SOLN
0.0000 ug/min | INTRAVENOUS | Status: DC
  Administered 2023-04-02: 5 ug/min via INTRAVENOUS
  Filled 2023-04-02: qty 250

## 2023-04-02 MED ORDER — CARVEDILOL 25 MG PO TABS
25.0000 mg | ORAL_TABLET | Freq: Two times a day (BID) | ORAL | Status: DC
  Administered 2023-04-02 – 2023-04-03 (×3): 25 mg via ORAL
  Filled 2023-04-02: qty 2
  Filled 2023-04-02 (×2): qty 1

## 2023-04-02 MED ORDER — DOXERCALCIFEROL 4 MCG/2ML IV SOLN
4.0000 ug | INTRAVENOUS | Status: DC
  Administered 2023-04-03: 4 ug via INTRAVENOUS
  Filled 2023-04-02 (×2): qty 2

## 2023-04-02 MED ORDER — HYDRALAZINE HCL 50 MG PO TABS
100.0000 mg | ORAL_TABLET | Freq: Three times a day (TID) | ORAL | Status: DC
  Administered 2023-04-02 – 2023-04-03 (×4): 100 mg via ORAL
  Filled 2023-04-02 (×4): qty 2

## 2023-04-02 MED ORDER — NITROGLYCERIN 2 % TD OINT
1.0000 [in_us] | TOPICAL_OINTMENT | Freq: Once | TRANSDERMAL | Status: AC
  Administered 2023-04-02: 1 [in_us] via TOPICAL
  Filled 2023-04-02: qty 1

## 2023-04-02 MED ORDER — ISOSORBIDE MONONITRATE ER 30 MG PO TB24
30.0000 mg | ORAL_TABLET | ORAL | Status: DC
  Administered 2023-04-03: 30 mg via ORAL
  Filled 2023-04-02: qty 1

## 2023-04-02 MED ORDER — OXYCODONE HCL 5 MG PO TABS
5.0000 mg | ORAL_TABLET | Freq: Three times a day (TID) | ORAL | Status: DC | PRN
  Administered 2023-04-02 – 2023-04-03 (×3): 5 mg via ORAL
  Filled 2023-04-02 (×3): qty 1

## 2023-04-02 MED ORDER — OXYCODONE-ACETAMINOPHEN 5-325 MG PO TABS
1.0000 | ORAL_TABLET | Freq: Three times a day (TID) | ORAL | Status: DC | PRN
  Administered 2023-04-03: 1 via ORAL
  Filled 2023-04-02: qty 1

## 2023-04-02 MED ORDER — SODIUM CHLORIDE 0.9% FLUSH
3.0000 mL | Freq: Two times a day (BID) | INTRAVENOUS | Status: DC
  Administered 2023-04-02 – 2023-04-03 (×2): 3 mL via INTRAVENOUS

## 2023-04-02 MED ORDER — ROSUVASTATIN CALCIUM 5 MG PO TABS
10.0000 mg | ORAL_TABLET | Freq: Every day | ORAL | Status: DC
  Administered 2023-04-02 – 2023-04-03 (×2): 10 mg via ORAL
  Filled 2023-04-02 (×2): qty 2

## 2023-04-02 MED ORDER — OXYCODONE-ACETAMINOPHEN 10-325 MG PO TABS: 1.0000 | ORAL_TABLET | Freq: Three times a day (TID) | ORAL | Status: DC | PRN

## 2023-04-02 MED ORDER — ACETAMINOPHEN 650 MG RE SUPP: 650.0000 mg | Freq: Four times a day (QID) | RECTAL | Status: DC | PRN

## 2023-04-02 MED ORDER — HEPARIN SODIUM (PORCINE) 1000 UNIT/ML DIALYSIS: 1000.0000 [IU] | INTRAMUSCULAR | Status: DC | PRN

## 2023-04-02 MED ORDER — FUROSEMIDE 10 MG/ML IJ SOLN
60.0000 mg | Freq: Once | INTRAMUSCULAR | Status: AC
  Administered 2023-04-02: 60 mg via INTRAVENOUS
  Filled 2023-04-02: qty 6

## 2023-04-02 MED ORDER — ACETAMINOPHEN 325 MG PO TABS: 650.0000 mg | ORAL_TABLET | Freq: Four times a day (QID) | ORAL | Status: DC | PRN

## 2023-04-02 MED ORDER — HEPARIN SODIUM (PORCINE) 1000 UNIT/ML IJ SOLN
INTRAMUSCULAR | Status: AC
  Administered 2023-04-02: 3800 [IU]
  Filled 2023-04-02: qty 4

## 2023-04-02 MED ORDER — PANTOPRAZOLE SODIUM 40 MG PO TBEC
40.0000 mg | DELAYED_RELEASE_TABLET | Freq: Every day | ORAL | Status: DC
  Administered 2023-04-02 – 2023-04-03 (×2): 40 mg via ORAL
  Filled 2023-04-02 (×2): qty 1

## 2023-04-02 MED ORDER — CLONIDINE HCL 0.1 MG PO TABS
0.1000 mg | ORAL_TABLET | Freq: Two times a day (BID) | ORAL | Status: DC
  Administered 2023-04-02 – 2023-04-03 (×3): 0.1 mg via ORAL
  Filled 2023-04-02 (×3): qty 1

## 2023-04-02 MED ORDER — SENNA 8.6 MG PO TABS: 1.0000 | ORAL_TABLET | Freq: Every day | ORAL | Status: DC | PRN

## 2023-04-02 MED ORDER — NITROGLYCERIN 2 % TD OINT
0.5000 [in_us] | TOPICAL_OINTMENT | Freq: Four times a day (QID) | TRANSDERMAL | Status: AC
  Administered 2023-04-02: 0.5 [in_us] via TOPICAL
  Filled 2023-04-02: qty 1

## 2023-04-02 MED ORDER — ISOSORBIDE MONONITRATE ER 60 MG PO TB24
60.0000 mg | ORAL_TABLET | ORAL | Status: DC
  Administered 2023-04-02: 60 mg via ORAL
  Filled 2023-04-02: qty 2
  Filled 2023-04-02: qty 1

## 2023-04-02 MED ORDER — ANTICOAGULANT SODIUM CITRATE 4% (200MG/5ML) IV SOLN: 5.0000 mL | Status: DC | PRN

## 2023-04-02 MED ORDER — TRAZODONE HCL 50 MG PO TABS
25.0000 mg | ORAL_TABLET | Freq: Every evening | ORAL | Status: DC | PRN
  Administered 2023-04-03: 50 mg via ORAL
  Filled 2023-04-02: qty 1

## 2023-04-02 MED ORDER — ISOSORBIDE MONONITRATE ER 30 MG PO TB24: 30.0000 mg | ORAL_TABLET | ORAL | Status: DC

## 2023-04-02 MED ORDER — CINACALCET HCL 30 MG PO TABS
60.0000 mg | ORAL_TABLET | ORAL | Status: DC
  Administered 2023-04-03: 60 mg via ORAL
  Filled 2023-04-02: qty 2

## 2023-04-02 MED ORDER — APIXABAN 2.5 MG PO TABS
2.5000 mg | ORAL_TABLET | Freq: Two times a day (BID) | ORAL | Status: DC
  Administered 2023-04-02 – 2023-04-03 (×3): 2.5 mg via ORAL
  Filled 2023-04-02 (×3): qty 1

## 2023-04-02 NOTE — Consult Note (Signed)
Renal Service Consult Note [facility]  JEWELIANA DUDGEON 04/02/2023 [name], MD Requesting Physician: Dr. Nelson Chimes  Reason for Consult: ESRD pt w/ SOB HPI: The patient is a 72 y.o. year-old w/ PMH as below who presented to ED w/ SOB. Missed HD yesterday because of her SOB. Mild cough, no CP or fevers. In ED pt afeb, on Bipap, normal HR, very high BP's. CXR +congestion and edema. K+ okay. BUN 48. Hb 9.1  BNP > 4500. Rec'd IV lasix and NTG in ED. Bipap then weaned to 3L [place].  Pt admitted. We are asked to see for dialysis.   Pt seen in ED. Danielson O2 in place.  Not in distress. No c/o's at this time, drowsy but responds to all questions. Poor historian at this time.   ROS - n/a   Past Medical History  Past Medical History:  Diagnosis Date   AF (paroxysmal atrial fibrillation) (HCC) 05/29/2019   on Coumadin   Aortic atherosclerosis (HCC) 07/05/2019   Aortic dissection (HCC) 04/04/2019   s/p repair   Bone spur 2008   Right calcaneal foot spur   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 Chevis Pretty; S/P re-excision of superior and inferior margins 10/27/2012.    ESRD on hemodialysis (HCC) 05/29/2019   Essential hypertension 09/16/2006   GERD 09/16/2006   Hepatitis C    treated and RNA confirmed not detectable 01/2017   Insomnia 03/14/2015   Malnutrition of moderate degree 05/19/2019   Non compliance with medical treatment 12/04/2017   Normocytic anemia    With thrombocytosis    Osteoarthritis    Right ureteral stone 2002   S/P lumbar spinal fusion 01/18/2014   S/P lumbar decompressive laminectomy, fusion, and plating for lumbar spinal stenosis on 05/27/2009 by Dr. Tia Alert.  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 Tia Alert.     Stroke (cerebrum) (HCC)    Tobacco use disorder 04/19/2009   Uterine fibroid    Wears dentures    top   Past Surgical History  Past Surgical History:  Procedure Laterality Date   ANTERIOR LAT LUMBAR FUSION N/A 01/18/2014   Procedure: ANTERIOR LATERAL LUMBAR FUSION LUMBAR TWO-THREE;  Surgeon: Tia Alert, MD;  Location: [facility];  Service: Neurosurgery;  Laterality: N/A;   ANTERIOR LUMBAR FUSION  01/18/2014   AV FISTULA PLACEMENT Left 04/20/2019   Procedure: ARTERIOVENOUS (AV) FISTULA CREATION;  Surgeon: Maeola Harman, MD;  Location: [facility] OR;  Service: Vascular;  Laterality: Left;   BACK SURGERY     BREAST LUMPECTOMY Left 01/2003   BREAST LUMPECTOMY Right 2014   BREAST LUMPECTOMY WITH NEEDLE LOCALIZATION AND AXILLARY SENTINEL LYMPH NODE BX Right 10/13/2012   Procedure: BREAST LUMPECTOMY WITH NEEDLE LOCALIZATION;  Surgeon: Robyne Askew, MD;  Location: [facility];  Service: General;  Laterality: Right;  Right  breast wire localized lumpectomy   INSERTION OF DIALYSIS CATHETER Right 04/20/2019   Procedure: INSERTION OF DIALYSIS CATHETER, right internal jugular;  Surgeon: Maeola Harman, MD;  Location: [facility] OR;  Service: Vascular;  Laterality: Right;   INSERTION OF DIALYSIS CATHETER Right 09/24/2020   Procedure: INSERTION OF TUNNELED DIALYSIS CATHETER;  Surgeon: Larina Earthly, MD;  Location: [facility] OR;  Service: Vascular;  Laterality: Right;   IR FLUORO GUIDE CV LINE RIGHT  09/22/2020   IR THORACENTESIS ASP  PLEURAL SPACE W/IMG GUIDE  05/19/2019   IR US GUIDE VASC ACCESS LEFT  09/22/2020   IR US GUIDE VASC ACCESS RIGHT  09/22/2020   IR VENOCAVAGRAM SVC  09/22/2020   LAMINECTOMY  05/27/2009   Lumbar decompressive laminectomy, fusion and plating for lumbar spinal stensosis   LIGATION OF ARTERIOVENOUS  FISTULA Left 09/24/2020   Procedure: LIGATION OF LEFT ARM ARTERIOVENOUS  FISTULA;  Surgeon: Larina Earthly, MD;  Location: [facility] OR;  Service: Vascular;  Laterality: Left;   LUMBAR LAMINECTOMY/DECOMPRESSION MICRODISCECTOMY Left 03/23/2013   Procedure: LUMBAR LAMINECTOMY/DECOMPRESSION MICRODISCECTOMY 1 LEVEL;  Surgeon: Tia Alert, MD;  Location: [facility];  Service: Neurosurgery;  Laterality: Left;  LUMBAR LAMINECTOMY/DECOMPRESSION MICRODISCECTOMY 1 LEVEL   MASTECTOMY, PARTIAL Left 02/26/2003   ; S/P re-excision of cranial and lateral margins 04/19/2003.    RE-EXCISION OF BREAST CANCER,SUPERIOR MARGINS Right 10/27/2012   Procedure: RE-EXCISION OF BREAST CANCER,SUPERIOR and inferior MARGINS;  Surgeon: Robyne Askew, MD;  Location: [facility] OR;  Service: General;  Laterality: Right;   RE-EXCISION OF BREAST LUMPECTOMY Left 04/2003   TEE WITHOUT CARDIOVERSION N/A 04/04/2019   Procedure: Transesophageal Echocardiogram (Tee);  Surgeon: Linden Dolin, MD;  Location: [facility] OR;  Service: Open Heart Surgery;  Laterality: N/A;   THORACIC AORTIC ANEURYSM REPAIR N/A 04/04/2019   Procedure: THORACIC ASCENDING ANEURYSM REPAIR (AAA)  USING 28 MM X 30 CM HEMASHIELD PLATINUM VASCULAR GRAFT;  Surgeon: Linden Dolin, MD;  Location: [facility] OR;  Service: Open Heart Surgery;  Laterality: N/A;   Family History  Family History  Problem Relation Age of Onset   Colon cancer Mother 41   Hypertension Mother    Diabetes Sister 49   Hypertension Sister    Diabetes Brother    Hypertension Brother    Diabetes Brother    Hypertension Brother    Kidney disease Son        s/p renal transplant   Hypertension Son    Diabetes Son     Multiple sclerosis Son    Bone cancer Sister 90   Breast cancer Neg Hx    Cervical cancer Neg Hx    Social History  reports that she quit smoking about 2 years ago. Her smoking use included cigarettes. She started smoking about 46 years ago. She has a 11 pack-year smoking history. She has never used smokeless tobacco. She reports that she does not currently use alcohol after a past usage of about 2.0 standard drinks of alcohol per week. She reports that she does not currently use drugs after having used the following drugs: Cocaine. Allergies  Allergies  Allergen Reactions   Shrimp [Shellfish Allergy] Shortness Of Breath   Bactroban [Mupirocin] Other (See Comments)    "Sores in nose"   Hydrocodone Itching, Nausea Only and Nausea And Vomiting   Chlorhexidine Gluconate Itching   Tylenol [Acetaminophen] Itching   Zestril [Lisinopril] Cough   Home medications Prior to Admission medications   Medication Sig Start Date End Date Taking? Authorizing  Provider  Oxycodone HCl 10 MG TABS Take 10 mg by mouth 3 (three) times daily as needed (For more moderate pain). 02/02/23  Yes [provider]  traZODone (DESYREL) 50 MG tablet Take 25-50 mg by mouth at bedtime. 03/19/23  Yes [provider]  albuterol (VENTOLIN HFA) 108 (90 Base) MCG/ACT inhaler Inhale 2 puffs into the lungs every 6 (six) hours as needed for wheezing or shortness of breath. 07/30/20   Janeece Agee, NP  amLODipine (NORVASC) 10 MG tablet Take 1 tablet (10 mg total) by mouth daily. 03/06/22   Setzer, Lynnell Jude, PA-C  apixaban (ELIQUIS) 2.5 MG TABS tablet Take 1 tablet (2.5 mg total) by mouth 2 (two) times daily. 03/06/22   Setzer, Lynnell Jude, PA-C  carvedilol (COREG) 25 MG tablet Take 1 tablet (25 mg total) by mouth 2 (two) times daily with a meal. 05/01/22 05/02/23  Ghimire, Werner Lean, MD  Cholecalciferol (VITAMIN D-3 PO) Take 1 capsule by mouth daily.    [provider]  cloNIDine (CATAPRES) 0.1 MG tablet Take 1  tablet (0.1 mg total) by mouth 2 (two) times daily. 12/18/22   Corky Crafts, MD  hydrALAZINE (APRESOLINE) 100 MG tablet Take 1 tablet (100 mg total) by mouth every 8 (eight) hours. 12/18/22   Corky Crafts, MD  isosorbide mononitrate (IMDUR) 30 MG 24 hr tablet TAKE 1 TABLET (30MG ) ON DIALYSIS DAYS AND 2 TABLETS (60 MG) ON NON DIALYSIS DAYS. 01/12/23   Corky Crafts, MD  naloxone [facility]) nasal spray 4 mg/0.1 mL 1 spray as directed. 02/19/22   [provider]  oxyCODONE-acetaminophen (PERCOCET) 10-325 MG tablet Take 1 tablet by mouth 3 (three) times daily as needed for pain. 03/12/22   [provider]  pantoprazole (PROTONIX) 40 MG tablet TAKE 1 TABLET (40 MG TOTAL) BY MOUTH 2 (TWO) TIMES DAILY. Patient taking differently: Take 40 mg by mouth daily. 10/08/22   Georganna Skeans, MD  rosuvastatin (CRESTOR) 10 MG tablet TAKE 1 TABLET (10 MG TOTAL) BY MOUTH DAILY FOR CHOLESTEROL Patient taking differently: Take 10 mg by mouth daily. 08/07/22   Georganna Skeans, MD  senna-docusate (SENOKOT-S) 8.6-50 MG tablet Take 1 tablet by mouth 2 (two) times daily between meals as needed for mild constipation. 08/14/22   Almon Hercules, MD  sevelamer carbonate (RENVELA) 2.4 g PACK Take 2.4 g by mouth 3 (three) times daily with meals. 03/06/22   Milinda Antis, PA-C     Vitals:   04/02/23 1005 04/02/23 1015 04/02/23 1030 04/02/23 1045  BP: (!) 149/97 (!) 143/84 137/82 (!) 146/83  Pulse:      Resp: (!) 23 19 (!) 22 (!) 21  Temp:      TempSrc:      SpO2:      Weight:      Height:       Exam Gen sleepy but responds to all questions appropriately No rash, cyanosis or gangrene Sclera anicteric, throat clear  No jvd or bruits Chest R side CTA, L dec'd BS at the base, no rales RRR no RG Abd soft ntnd no mass or ascites +bs GU defer MS no joint effusions or deformity Ext no LE or UE edema, no wounds or ulcers Neuro is alert, Ox 3 , nf    RIJ TDC intact      Renal-related home  meds: - oxy IR prn - norvasc 10 - eliquis 2.5 bid - coreg 25 bid - clonidine 0.1 bid - hydralazine 100 tid - imdur  30-60 - percocet prn - renvela 3 ac tid     OP HD: SW TTS  3.5h   400/1.5   45.5kg  2/2 bath  TDC   Heparin none - last OP HD 10/29, post wt 48 - has been coming off 2-3kg over the last 2 weeks - sensipar 60mg  - hectorol 9 mcg IV - venofer 50mg  weekly - mircera 100 mcg q 2 wks, last 10/29, due 11/13  - last Hb 8.9 on 10/24   Assessment/ Plan: Acute hypoxic resp failure - due to vol overload in esrd pt. CXR +congestion and early edema. Rising wt's at OP HD for last 2 wks. Only gets 2 L removed due to cramping, and gains more than that. Required bipap early this am, now moved to 3 L [place] and is stable. Plan iHD upstairs next available slot.  ESKD - on HD TTS.  Has not missed HD recently. HD today.  HTN/ BP - on multiple BP meds at home. BP 220/120 in Ed originally. Has rec'd po norvasc, coreg, clonidine, hydralazine and imdur. On IV ntg-> will change to nitropaste, dc IV ntg prior to HD.  Volume - as above Anemia of eskd - Hb 8-10 here. Just got esa at OP unit, next due is 11/13.  MBD ckd - Ca in range, add on alb/ phos. Cont renvela as binder, sensipar and IV vdra.  PAF - on eliquis, BB      Rob Kena Limon  MD [facility] 04/02/2023, 11:01 AM  Recent Labs  Lab 04/02/23 0208 04/02/23 0556  HGB 9.1* 8.4*  CALCIUM 9.0 8.9  CREATININE 9.87* 10.52*  K 4.7 4.8   Inpatient medications:  amLODipine  10 mg Oral Daily   apixaban  2.5 mg Oral BID   carvedilol  25 mg Oral BID WC   cloNIDine  0.1 mg Oral BID   hydrALAZINE  100 mg Oral Q8H   [START ON 04/03/2023] isosorbide mononitrate  30 mg Oral Once per day on Tuesday Thursday Saturday   And   isosorbide mononitrate  60 mg Oral Once per day on Sunday Monday Wednesday Friday   pantoprazole  40 mg Oral Daily   rosuvastatin  10 mg Oral Daily   sevelamer carbonate  2.4 g Oral TID WC   sodium chloride flush  3 mL Intravenous  Q12H    nitroGLYCERIN 45 mcg/min (04/02/23 1054)   fentaNYL (SUBLIMAZE) injection, oxyCODONE-acetaminophen **AND** oxyCODONE, senna, traZODone

## 2023-04-02 NOTE — ED Notes (Signed)
Patient transported to dialysis

## 2023-04-02 NOTE — Procedures (Signed)
HD Note:  Some information was entered later than the data was gathered due to patient care needs. The stated time with the data is accurate.  Received patient in bed to unit.   Alert and oriented.   Informed consent signed and in chart.   Access used: Right upper chest HD catheter Access issues: None  Patient had leg and toe cramping at times during treatment that required UF to be stopped.  She sat up in the bed and moved around with the cramping.  UF was on when the cramping subsided.  TX duration: 3.5 hours  Alert, without acute distress.  Total UF removed: 3000 ml  Hand-off given to patient's nurse.   Transported back to the room   Anwyn Kriegel L. Dareen Piano, RN [facility] Dialysis Unit.

## 2023-04-02 NOTE — ED Notes (Signed)
ED TO INPATIENT HANDOFF REPORT  ED Nurse Name and Phone #: Victorino Dike [phone]  S Name/Age/Gender Monique Henderson 72 y.o. female Room/Bed: 045C/045C  Code Status   Code Status: Full Code  Home/SNF/Other Home Patient oriented to: self, place, time, and situation Is this baseline? Yes   Triage Complete: Triage complete  Chief Complaint Acute respiratory failure with hypoxia [facility]) [J96.01]  Triage Note Patient missed Dialysia on 04/01/23, she is C/O [facility] and lower back pain, reports cough and chills for 1 week. Dialysis schedule Tues, Thurs, Sat   Allergies Allergies  Allergen Reactions   Shrimp [Shellfish Allergy] Shortness Of Breath   Bactroban [Mupirocin] Other (See Comments)    "Sores in nose"   Hydrocodone Itching, Nausea Only and Nausea And Vomiting   Chlorhexidine Gluconate Itching   Tylenol [Acetaminophen] Itching   Zestril [Lisinopril] Cough    Level of Care/Admitting Diagnosis ED Disposition     ED Disposition  Admit   Condition  --   Comment  Hospital Area: [facility] [[id number]]  Level of Care: Progressive [102]  Admit to Progressive based on following criteria: MULTISYSTEM THREATS such as stable sepsis, metabolic/electrolyte imbalance with or without encephalopathy that is responding to early treatment.  May admit patient to [facility] or [facility] if equivalent level of care is available:: No  Covid Evaluation: Asymptomatic - no recent exposure (last 10 days) testing not required  Diagnosis: Acute respiratory failure with hypoxia [facility]) [[id number]]  Admitting Physician: Briscoe Deutscher [[id number]]  Attending Physician: Briscoe Deutscher [[id number]]  Certification:: I certify this patient will need inpatient services for at least 2 midnights  Expected Medical Readiness: 04/05/2023          B Medical/Surgery History Past Medical History:  Diagnosis Date   AF (paroxysmal atrial fibrillation) (HCC) 05/29/2019   on Coumadin   Aortic  atherosclerosis (HCC) 07/05/2019   Aortic dissection (HCC) 04/04/2019   s/p repair   Bone spur 2008   Right calcaneal foot spur   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 Chevis Pretty; S/P re-excision of superior and inferior margins 10/27/2012.    ESRD on hemodialysis (HCC) 05/29/2019   Essential hypertension 09/16/2006   GERD 09/16/2006   Hepatitis C    treated and RNA confirmed not detectable 01/2017   Insomnia 03/14/2015   Malnutrition of moderate degree 05/19/2019   Non compliance with medical treatment 12/04/2017   Normocytic anemia    With thrombocytosis   Osteoarthritis    Right ureteral stone 2002   S/P lumbar spinal fusion 01/18/2014   S/P lumbar decompressive laminectomy, fusion, and plating for lumbar spinal stenosis on 05/27/2009 by Dr. Tia Alert.  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 Tia Alert.     Stroke (cerebrum) (HCC)    Tobacco use disorder 04/19/2009   Uterine fibroid    Wears dentures  top   Past Surgical History:  Procedure Laterality Date   ANTERIOR LAT LUMBAR FUSION N/A 01/18/2014   Procedure: ANTERIOR LATERAL LUMBAR FUSION LUMBAR TWO-THREE;  Surgeon: Tia Alert, MD;  Location: [facility];  Service: Neurosurgery;  Laterality: N/A;   ANTERIOR LUMBAR FUSION  01/18/2014    AV FISTULA PLACEMENT Left 04/20/2019   Procedure: ARTERIOVENOUS (AV) FISTULA CREATION;  Surgeon: Maeola Harman, MD;  Location: [facility] OR;  Service: Vascular;  Laterality: Left;   BACK SURGERY     BREAST LUMPECTOMY Left 01/2003   BREAST LUMPECTOMY Right 2014   BREAST LUMPECTOMY WITH NEEDLE LOCALIZATION AND AXILLARY SENTINEL LYMPH NODE BX Right 10/13/2012   Procedure: BREAST LUMPECTOMY WITH NEEDLE LOCALIZATION;  Surgeon: Robyne Askew, MD;  Location: [facility];  Service: General;  Laterality: Right;  Right breast wire localized lumpectomy   INSERTION OF DIALYSIS CATHETER Right 04/20/2019   Procedure: INSERTION OF DIALYSIS CATHETER, right internal jugular;  Surgeon: Maeola Harman, MD;  Location: [facility] OR;  Service: Vascular;  Laterality: Right;   INSERTION OF DIALYSIS CATHETER Right 09/24/2020   Procedure: INSERTION OF TUNNELED DIALYSIS CATHETER;  Surgeon: Larina Earthly, MD;  Location: [facility] OR;  Service: Vascular;  Laterality: Right;   IR FLUORO GUIDE CV LINE RIGHT  09/22/2020   IR THORACENTESIS ASP PLEURAL SPACE W/IMG GUIDE  05/19/2019   IR US GUIDE VASC ACCESS LEFT  09/22/2020   IR US GUIDE VASC ACCESS RIGHT  09/22/2020   IR VENOCAVAGRAM SVC  09/22/2020   LAMINECTOMY  05/27/2009   Lumbar decompressive laminectomy, fusion and plating for lumbar spinal stensosis   LIGATION OF ARTERIOVENOUS  FISTULA Left 09/24/2020   Procedure: LIGATION OF LEFT ARM ARTERIOVENOUS  FISTULA;  Surgeon: Larina Earthly, MD;  Location: [facility] OR;  Service: Vascular;  Laterality: Left;   LUMBAR LAMINECTOMY/DECOMPRESSION MICRODISCECTOMY Left 03/23/2013   Procedure: LUMBAR LAMINECTOMY/DECOMPRESSION MICRODISCECTOMY 1 LEVEL;  Surgeon: Tia Alert, MD;  Location: [facility];  Service: Neurosurgery;  Laterality: Left;  LUMBAR LAMINECTOMY/DECOMPRESSION MICRODISCECTOMY 1 LEVEL   MASTECTOMY, PARTIAL Left 02/26/2003   ; S/P re-excision of cranial and lateral margins 04/19/2003.    RE-EXCISION OF BREAST  CANCER,SUPERIOR MARGINS Right 10/27/2012   Procedure: RE-EXCISION OF BREAST CANCER,SUPERIOR and inferior MARGINS;  Surgeon: Robyne Askew, MD;  Location: [facility] OR;  Service: General;  Laterality: Right;   RE-EXCISION OF BREAST LUMPECTOMY Left 04/2003   TEE WITHOUT CARDIOVERSION N/A 04/04/2019   Procedure: Transesophageal Echocardiogram (Tee);  Surgeon: Linden Dolin, MD;  Location[facility] OR;  Service: Open Heart Surgery;  Laterality: N/A;   THORACIC AORTIC ANEURYSM REPAIR N/A 04/04/2019   Procedure: THORACIC ASCENDING ANEURYSM REPAIR (AAA)  USING 28 MM X 30 CM HEMASHIELD PLATINUM VASCULAR GRAFT;  Surgeon: Linden Dolin, MD;  Location: [facility] OR;  Service: Open Heart Surgery;  Laterality: N/A;     A IV Location/Drains/Wounds Patient Lines/Drains/Airways Status     Active Line/Drains/Airways     Name Placement date Placement time Site Days   Peripheral IV 04/02/23 20 G Anterior;Distal;Left Wrist 04/02/23  0206  Wrist  less than 1   Hemodialysis Catheter Right Subclavian --  --  Subclavian  --   Hemodialysis Catheter Right Subclavian --  --  Subclavian  --            Intake/Output Last 24 hours  Intake/Output Summary (Last 24 hours) at 04/02/2023 1816 Last data filed at 04/02/2023 1721 Gross per 24 hour  Intake 111.85 ml  Output 3100 ml  Net -2988.15 ml    Labs/Imaging Results for orders placed or performed during the hospital encounter of 04/02/23 (from the past 48 hour(s))  Basic metabolic panel     Status: Abnormal   Collection Time: 04/02/23  2:08 AM  Result Value Ref Range   Sodium 139 135 - 145 mmol/L   Potassium 4.7 3.5 - 5.1 mmol/L   Chloride 99 98 - 111 mmol/L   CO2 23 22 - 32 mmol/L   Glucose, Bld 102 (H) 70 - 99 mg/dL    Comment: Glucose reference range applies only to samples taken after fasting for at least 8 hours.   BUN 48 (H) 8 - 23 mg/dL   Creatinine, Ser 1.61 (H) 0.44 - 1.00 mg/dL   Calcium 9.0 8.9 - 09.6 mg/dL   GFR, Estimated 4 (L) >60 mL/min    Comment:  (NOTE) Calculated using the CKD-EPI Creatinine Equation (2021)    Anion gap 17 (H) 5 - 15    Comment: Performed at [facility] Lab, [address] [place], [place] [address]  CBC with Differential     Status: Abnormal   Collection Time: 04/02/23  2:08 AM  Result Value Ref Range   WBC 7.3 4.0 - 10.5 K/uL   RBC 3.02 (L) 3.87 - 5.11 MIL/uL   Hemoglobin 9.1 (L) 12.0 - 15.0 g/dL   HCT 98.1 (L) 19.1 - 47.8 %   MCV 97.0 80.0 - 100.0 fL   MCH 30.1 26.0 - 34.0 pg   MCHC 31.1 30.0 - 36.0 g/dL   RDW 29.5 (H) 62.1 - 30.8 %   Platelets 215 150 - 400 K/uL   nRBC 0.0 0.0 - 0.2 %   Neutrophils Relative % 76 %   Neutro Abs 5.5 1.7 - 7.7 K/uL   Lymphocytes Relative 12 %   Lymphs Abs 0.8 0.7 - 4.0 K/uL   Monocytes Relative 8 %   Monocytes Absolute 0.6 0.1 - 1.0 K/uL   Eosinophils Relative 3 %   Eosinophils Absolute 0.3 0.0 - 0.5 K/uL   Basophils Relative 1 %   Basophils Absolute 0.1 0.0 - 0.1 K/uL   Immature Granulocytes 0 %   Abs Immature Granulocytes 0.03 0.00 - 0.07 K/uL    Comment: Performed at [facility], [address] [place], [place] [address]  Brain natriuretic peptide     Status: Abnormal   Collection Time: 04/02/23  2:08 AM  Result Value Ref Range   B Natriuretic Peptide >4,500.0 (H) 0.0 - 100.0 pg/mL    Comment: Performed at [facility], [address] [place], [place] [address]  Troponin I (High Sensitivity)     Status: Abnormal   Collection Time: 04/02/23  2:08 AM  Result Value Ref Range   Troponin I (High Sensitivity) 51 (H) <18 ng/L    Comment: (NOTE) Elevated high sensitivity troponin I (hsTnI) values and significant  changes across serial measurements may suggest ACS but many other  chronic and acute conditions are known to elevate hsTnI results.  Refer to the "Links" section for chest pain algorithms and additional  guidance. Performed at [facility], [address], [place], [place] [address]   Troponin I (High Sensitivity)     Status:  Abnormal   Collection Time: 04/02/23  4:17 AM  Result Value Ref Range   Troponin I (High Sensitivity) 63 (H) <18 ng/L    Comment: (NOTE) Elevated high sensitivity troponin I (hsTnI) values and  significant  changes across serial measurements may suggest ACS but many other  chronic and acute conditions are known to elevate hsTnI results.  Refer to the "Links" section for chest pain algorithms and additional  guidance. Performed at [facility], [address] [place], [place] [address]   Basic metabolic panel     Status: Abnormal   Collection Time: 04/02/23  5:56 AM  Result Value Ref Range   Sodium 138 135 - 145 mmol/L   Potassium 4.8 3.5 - 5.1 mmol/L   Chloride 97 (L) 98 - 111 mmol/L   CO2 25 22 - 32 mmol/L   Glucose, Bld 97 70 - 99 mg/dL    Comment: Glucose reference range applies only to samples taken after fasting for at least 8 hours.   BUN 52 (H) 8 - 23 mg/dL   Creatinine, Ser 52.84 (H) 0.44 - 1.00 mg/dL   Calcium 8.9 8.9 - 13.2 mg/dL   GFR, Estimated 4 (L) >60 mL/min    Comment: (NOTE) Calculated using the CKD-EPI Creatinine Equation (2021)    Anion gap 16 (H) 5 - 15    Comment: Performed at [facility], [address], [place], [place] [address]  CBC     Status: Abnormal   Collection Time: 04/02/23  5:56 AM  Result Value Ref Range   WBC 6.9 4.0 - 10.5 K/uL   RBC 2.82 (L) 3.87 - 5.11 MIL/uL   Hemoglobin 8.4 (L) 12.0 - 15.0 g/dL   HCT 27.2 (L) 53.6 - 64.4 %   MCV 96.8 80.0 - 100.0 fL   MCH 29.8 26.0 - 34.0 pg   MCHC 30.8 30.0 - 36.0 g/dL   RDW 03.4 (H) 74.2 - 59.5 %   Platelets 209 150 - 400 K/uL   nRBC 0.0 0.0 - 0.2 %    Comment: Performed at [facility], [address], [place], [place] [address]  Hepatitis B surface antigen     Status: None   Collection Time: 04/02/23  1:43 PM  Result Value Ref Range   Hepatitis B Surface Ag NON REACTIVE NON REACTIVE    Comment: Performed at [facility], [name], [place], [place] [address]   Albumin     Status: Abnormal   Collection Time: 04/02/23  1:43 PM  Result Value Ref Range   Albumin 3.0 (L) 3.5 - 5.0 g/dL    Comment: Performed at [facility], [address], [place], [place] [address]  Phosphorus     Status: Abnormal   Collection Time: 04/02/23  1:43 PM  Result Value Ref Range   Phosphorus 6.6 (H) 2.5 - 4.6 mg/dL    Comment: Performed at [facility], [address], [place], [place] [address]   *Note: Due to a large number of results and/or encounters for the requested time period, some results have not been displayed. A complete set of results can be found in Results Review.   DG Chest 2 View  Result Date: 04/02/2023 CLINICAL DATA:  Shortness of breath EXAM: CHEST - 2 VIEW COMPARISON:  01/27/2023 FINDINGS: Post sternotomy changes. Right-sided central venous catheter with tips at the right atrium. Cardiomegaly with vascular congestion and pulmonary edema, similar compared to prior exam. More confluent airspace disease at the bases could reflect edema or pneumonia. Small pleural effusions. Aortic atherosclerosis. No pneumothorax. IMPRESSION: Cardiomegaly with vascular congestion and pulmonary edema. More confluent airspace disease at the bases could reflect edema or pneumonia.  Small pleural effusions. Findings do not appear significantly changed compared to prior. Electronically Signed   By: Jasmine Pang M.D.   On: 04/02/2023 03:25    Pending Labs Unresulted Labs (From admission, onward)     Start     Ordered   04/02/23 1112  Hepatitis B surface antibody,quantitative  (New Admission Hemo Labs (Hepatitis B))  Once,   R        04/02/23 1113   04/02/23 0500  Basic metabolic panel  Daily,   R      04/02/23 0432   04/02/23 0500  CBC  Daily,   R      04/02/23 0432            Vitals/Pain Today's Vitals   04/02/23 1700 04/02/23 1718 04/02/23 1721 04/02/23 1723  BP: (!) 145/85 135/87 (!) 166/94   Pulse:   96   Resp: (!) 22 18 (!) 24   Temp:       TempSrc:      SpO2: 100% 100% 100%   Weight:      Height:      PainSc:    10-Worst pain ever    Isolation Precautions No active isolations  Medications Medications  sodium chloride flush (NS) 0.9 % injection 3 mL (3 mLs Intravenous Not Given 04/02/23 0926)  senna (SENOKOT) tablet 8.6 mg (has no administration in time range)  fentaNYL (SUBLIMAZE) injection 25-50 mcg (50 mcg Intravenous Given 04/02/23 0534)  amLODipine (NORVASC) tablet 10 mg (10 mg Oral Given 04/02/23 0915)  carvedilol (COREG) tablet 25 mg (25 mg Oral Given 04/02/23 0916)  cloNIDine (CATAPRES) tablet 0.1 mg (0.1 mg Oral Given 04/02/23 0916)  hydrALAZINE (APRESOLINE) tablet 100 mg (100 mg Oral Given 04/02/23 0538)  rosuvastatin (CRESTOR) tablet 10 mg (10 mg Oral Given 04/02/23 0916)  traZODone (DESYREL) tablet 25-50 mg (has no administration in time range)  pantoprazole (PROTONIX) EC tablet 40 mg (40 mg Oral Given 04/02/23 0916)  sevelamer carbonate (RENVELA) powder PACK 2.4 g (2.4 g Oral Patient Refused/Not Given 04/02/23 1157)  apixaban (ELIQUIS) tablet 2.5 mg (2.5 mg Oral Given 04/02/23 0923)  oxyCODONE-acetaminophen (PERCOCET/ROXICET) 5-325 MG per tablet 1 tablet (has no administration in time range)    And  oxyCODONE (Oxy IR/ROXICODONE) immediate release tablet 5 mg (5 mg Oral Given 04/02/23 0534)  isosorbide mononitrate (IMDUR) 24 hr tablet 30 mg (has no administration in time range)    And  isosorbide mononitrate (IMDUR) 24 hr tablet 60 mg (60 mg Oral Given 04/02/23 0916)  nitroGLYCERIN (NITROGLYN) 2 % ointment 0.5 inch (0.5 inches Topical Given 04/02/23 1155)  cinacalcet (SENSIPAR) tablet 60 mg (has no administration in time range)  doxercalciferol (HECTOROL) injection 4 mcg (has no administration in time range)  nitroGLYCERIN (NITROGLYN) 2 % ointment 1 inch (1 inch Topical Given 04/02/23 0205)  furosemide (LASIX) injection 60 mg (60 mg Intravenous Given 04/02/23 0206)    Mobility walks with device     Focused  Assessments Renal Assessment Handoff:  Hemodialysis Schedule: Hemodialysis Schedule: Monday/Wednesday/Friday Last Hemodialysis date and time: today - just returned    Restricted appendage:     R Recommendations: See Admitting Provider Note  Report given to:   Additional Notes:

## 2023-04-02 NOTE — Progress Notes (Addendum)
PROGRESS NOTE    Monique Henderson  ZOX:[id number] DOB: Oct 09, 1950 DOA: 04/02/2023 PCP: Fleet Contras, MD   Brief Narrative:  72 year old with history of HTN, PAF on Eliquis, chronic CHF, COPD, ESRD on HD admitted for shortness of breath.  Apparently patient did not go to her outpatient dialysis.  Chest x-ray showed volume overload.  Nephrology was consulted, BiPAP was initiated.   Assessment & Plan:  Principal Problem:   Acute respiratory failure with hypoxia (HCC) Active Problems:   Hypertensive emergency   Paroxysmal atrial fibrillation (HCC)   Chronic low back pain   Chronic obstructive lung disease (HCC)   History of CVA (cerebrovascular accident)   ESRD (end stage renal disease) (HCC)   Hypervolemia associated with renal insufficiency   Pulmonary edema, acute (HCC)   Acute pulmonary edema Acute respiratory failure requiring BiPAP -Secondary to volume overload.  It is being addressed with dialysis.  Slowly wean off BiPAP.  ESRD on HD - Apparently missed her last session due to shortness of breath.  Nephrology consulted.  Congestive heart failure; ef 45% -Current medications cortrak, Imdur, clonidine.  History of essential hypertension - On clonidine, Coreg, amlodipine  Paroxysmal A-fib - Continue Eliquis.  Coreg.  History of stroke - Eliquis and Crestor  Chronic back pain - Pain control.  Bowel regimen as needed   DVT prophylaxis: Eliquis Code Status: Full Family Communication:   Status is: Inpatient Remains inpatient appropriate because: Cont hosp stay for Vol overload management.   Subjective:  Seen in ED, still on O2. Off BiPAP BP better on nitroglycerine drip.  Nephro to dialyze the ptn today  Examination:  General exam: Appears calm and comfortable  Respiratory system: Clear to auscultation. Respiratory effort normal. Cardiovascular system: S1 & S2 heard, RRR. No JVD, murmurs, rubs, gallops or clicks. No pedal edema. Gastrointestinal system:  Abdomen is nondistended, soft and nontender. No organomegaly or masses felt. Normal bowel sounds heard. Central nervous system: Alert and oriented. No focal neurological deficits. Extremities: Symmetric 5 x 5 power. Skin: No rashes, lesions or ulcers Psychiatry: Judgement and insight appear normal. Mood & affect appropriate.      Diet Orders (From admission, onward)     Start     Ordered   04/02/23 0431  Diet NPO time specified  Diet effective now       Comments: Can have renal diet with 1200 mL/day fluid-restriction once stable off of BiPAP   04/02/23 0432            Objective: Vitals:   04/02/23 0538 04/02/23 0630 04/02/23 0645 04/02/23 0800  BP: (!) 210/116 (!) 193/112 (!) 189/114 (!) 175/100  Pulse:      Resp:  (!) 25 (!) 27 (!) 21  Temp:      TempSrc:      SpO2:      Weight:      Height:       No intake or output data in the 24 hours ending 04/02/23 0859 Filed Weights   04/02/23 0048  Weight: 49.4 kg    Scheduled Meds:  amLODipine  10 mg Oral Daily   apixaban  2.5 mg Oral BID   carvedilol  25 mg Oral BID WC   cloNIDine  0.1 mg Oral BID   hydrALAZINE  100 mg Oral Q8H   [START ON 04/03/2023] isosorbide mononitrate  30 mg Oral Once per day on Tuesday Thursday Saturday   And   isosorbide mononitrate  60 mg Oral Once per day on Sunday  Monday Wednesday Friday   pantoprazole  40 mg Oral Daily   rosuvastatin  10 mg Oral Daily   sevelamer carbonate  2.4 g Oral TID WC   sodium chloride flush  3 mL Intravenous Q12H   Continuous Infusions:  nitroGLYCERIN 65 mcg/min (04/02/23 0812)    Nutritional status     Body mass index is 19.31 kg/m.  Data Reviewed:   CBC: Recent Labs  Lab 04/02/23 0208 04/02/23 0556  WBC 7.3 6.9  NEUTROABS 5.5  --   HGB 9.1* 8.4*  HCT 29.3* 27.3*  MCV 97.0 96.8  PLT 215 209   Basic Metabolic Panel: Recent Labs  Lab 04/02/23 0208 04/02/23 0556  NA 139 138  K 4.7 4.8  CL 99 97*  CO2 23 25  GLUCOSE 102* 97  BUN 48* 52*   CREATININE 9.87* 10.52*  CALCIUM 9.0 8.9   GFR: Estimated Creatinine Clearance: 3.9 mL/min (A) (by C-G formula based on SCr of 10.52 mg/dL (H)). Liver Function Tests: No results for input(s): "AST", "ALT", "ALKPHOS", "BILITOT", "PROT", "ALBUMIN" in the last 168 hours. No results for input(s): "LIPASE", "AMYLASE" in the last 168 hours. No results for input(s): "AMMONIA" in the last 168 hours. Coagulation Profile: No results for input(s): "INR", "PROTIME" in the last 168 hours. Cardiac Enzymes: No results for input(s): "CKTOTAL", "CKMB", "CKMBINDEX", "TROPONINI" in the last 168 hours. BNP (last 3 results) No results for input(s): "PROBNP" in the last 8760 hours. HbA1C: No results for input(s): "HGBA1C" in the last 72 hours. CBG: No results for input(s): "GLUCAP" in the last 168 hours. Lipid Profile: No results for input(s): "CHOL", "HDL", "LDLCALC", "TRIG", "CHOLHDL", "LDLDIRECT" in the last 72 hours. Thyroid Function Tests: No results for input(s): "TSH", "T4TOTAL", "FREET4", "T3FREE", "THYROIDAB" in the last 72 hours. Anemia Panel: No results for input(s): "VITAMINB12", "FOLATE", "FERRITIN", "TIBC", "IRON", "RETICCTPCT" in the last 72 hours. Sepsis Labs: No results for input(s): "PROCALCITON", "LATICACIDVEN" in the last 168 hours.  No results found for this or any previous visit (from the past 240 hour(s)).       Radiology Studies: DG Chest 2 View  Result Date: 04/02/2023 CLINICAL DATA:  Shortness of breath EXAM: CHEST - 2 VIEW COMPARISON:  01/27/2023 FINDINGS: Post sternotomy changes. Right-sided central venous catheter with tips at the right atrium. Cardiomegaly with vascular congestion and pulmonary edema, similar compared to prior exam. More confluent airspace disease at the bases could reflect edema or pneumonia. Small pleural effusions. Aortic atherosclerosis. No pneumothorax. IMPRESSION: Cardiomegaly with vascular congestion and pulmonary edema. More confluent airspace  disease at the bases could reflect edema or pneumonia. Small pleural effusions. Findings do not appear significantly changed compared to prior. Electronically Signed   By: Jasmine Pang M.D.   On: 04/02/2023 03:25           LOS: 0 days  Critical care time spent : 35 minutes examining the patient, discussing with RN, consultants as needed, coordinating care and management.The medical decision making on this patient was of high complexity, the critically ill patient is at high risk for clinical deterioration.  Critical care time was exclusive of separately billable procedures and treating other patients. Critical care was necessary to treat or prevent imminent or life-threatening deterioration. Critical care was time spent personally by me on the following activities: development of treatment plan with patient and/or surrogate as well as nursing, discussions with consultants, evaluation of patient's response to treatment, examination of patient, obtaining history from patient or surrogate, ordering and performing treatments  and interventions, ordering and review of laboratory studies, ordering and review of radiographic studies, pulse oximetry and re-evaluation of patient's condition.   Miguel Rota, MD [facility]  If 7PM-7AM, please contact night-coverage  04/02/2023, 8:59 AM

## 2023-04-02 NOTE — H&P (Signed)
History and Physical    Monique Henderson WGN:[id number] DOB: [birth year] DOA: 04/02/2023  PCP: Fleet Contras, MD   Patient coming from: Home   Chief Complaint: SOB  HPI: Monique Henderson is a 72 y.o. female with medical history significant for hypertension, PAF on Eliquis, chronic HFmrEF, COPD, and ESRD on hemodialysis who presents with shortness of breath.  Patient reports that she developed shortness of breath yesterday and did not go to dialysis as scheduled due to her dyspnea.  She has had a mild cough associated with this but denies chest pain or fevers.  She also denies headache, change in vision, or acute focal numbness or weakness.  ED Course: Upon arrival to the ED, patient is found to be afebrile and saturating well on BiPAP with tachypnea, normal heart rate, and severely elevated blood pressure.  EKG demonstrates sinus rhythm with RBBB and prolonged QT interval.  Chest x-ray is notable for cardiomegaly, vascular congestion, and pulmonary edema.  Labs are most notable for normal potassium, normal bicarbonate, BUN 48, hemoglobin 9.1, troponin 51, and BNP >4500.  Nephrology (Dr. Arrie Aran) was consulted by the ED PA, patient was placed on BiPAP, and she was treated with IV Lasix and nitroglycerin ointment in the ED.  Review of Systems:  All other systems reviewed and apart from HPI, are negative.  Past Medical History:  Diagnosis Date   AF (paroxysmal atrial fibrillation) (HCC) 05/29/2019   on Coumadin   Aortic atherosclerosis (HCC) 07/05/2019   Aortic dissection (HCC) 04/04/2019   s/p repair   Bone spur 2008   Right calcaneal foot spur   Breast cancer (HCC) 2004   Ductal carcinoma in situ of the left breast; S/P left partial mastectomy 02/26/2003; S/P re-excision of cranial and lateral margins11/18/2004.radiation   Carotid artery disease (HCC)    Cerebral thrombosis with cerebral infarction 05/22/2019   Chronic HFrEF (heart failure with reduced ejection fraction) (HCC)     Chronic low back pain 06/22/2016   Chronic obstructive lung disease (HCC) 01/16/2017   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 Chevis Pretty; S/P re-excision of superior and inferior margins 10/27/2012.    ESRD on hemodialysis (HCC) 05/29/2019   Essential hypertension 09/16/2006   GERD 09/16/2006   Hepatitis C    treated and RNA confirmed not detectable 01/2017   Insomnia 03/14/2015   Malnutrition of moderate degree 05/19/2019   Non compliance with medical treatment 12/04/2017   Normocytic anemia    With thrombocytosis   Osteoarthritis    Right ureteral stone 2002   S/P lumbar spinal fusion 01/18/2014   S/P lumbar decompressive laminectomy, fusion, and plating for lumbar spinal stenosis on 05/27/2009 by Dr. Tia Alert.  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 Tia Alert.     Stroke (cerebrum) (HCC)    Tobacco use disorder 04/19/2009   Uterine fibroid    Wears dentures    top    Past Surgical History:  Procedure Laterality Date   ANTERIOR LAT LUMBAR FUSION N/A 01/18/2014   Procedure: ANTERIOR LATERAL LUMBAR FUSION LUMBAR TWO-THREE;  Surgeon: Tia Alert, MD;  Location: [facility];  Service: Neurosurgery;  Laterality: N/A;   ANTERIOR LUMBAR FUSION  01/18/2014   AV FISTULA PLACEMENT Left 04/20/2019   Procedure: ARTERIOVENOUS (AV) FISTULA CREATION;  Surgeon: Maeola Harman, MD;  Location: [facility] OR;  Service: Vascular;  Laterality: Left;   BACK SURGERY     BREAST LUMPECTOMY Left 01/2003   BREAST LUMPECTOMY Right 2014   BREAST LUMPECTOMY WITH NEEDLE LOCALIZATION AND AXILLARY SENTINEL LYMPH NODE BX Right 10/13/2012   Procedure: BREAST LUMPECTOMY WITH NEEDLE LOCALIZATION;  Surgeon: Robyne Askew, MD;  Location: [facility];  Service: General;  Laterality: Right;  Right breast wire localized lumpectomy   INSERTION OF DIALYSIS CATHETER Right 04/20/2019   Procedure: INSERTION OF DIALYSIS CATHETER, right internal jugular;  Surgeon: Maeola Harman, MD;  Location: [facility] OR;  Service: Vascular;  Laterality: Right;   INSERTION OF DIALYSIS CATHETER Right 09/24/2020   Procedure: INSERTION OF TUNNELED DIALYSIS CATHETER;  Surgeon: Larina Earthly, MD;  Location: [facility] OR;  Service: Vascular;  Laterality: Right;   IR FLUORO GUIDE CV LINE RIGHT  09/22/2020   IR THORACENTESIS ASP PLEURAL SPACE W/IMG GUIDE  05/19/2019   IR US GUIDE VASC ACCESS LEFT  09/22/2020   IR US GUIDE VASC ACCESS RIGHT  09/22/2020   IR VENOCAVAGRAM SVC  09/22/2020   LAMINECTOMY  05/27/2009   Lumbar decompressive laminectomy, fusion and plating for lumbar spinal stensosis   LIGATION OF ARTERIOVENOUS  FISTULA Left 09/24/2020   Procedure: LIGATION OF LEFT ARM ARTERIOVENOUS  FISTULA;  Surgeon: Larina Earthly, MD;  Location: [facility] OR;  Service: Vascular;  Laterality: Left;   LUMBAR LAMINECTOMY/DECOMPRESSION MICRODISCECTOMY Left 03/23/2013   Procedure: LUMBAR LAMINECTOMY/DECOMPRESSION MICRODISCECTOMY 1 LEVEL;  Surgeon: Tia Alert, MD;  Location: [facility];  Service: Neurosurgery;  Laterality: Left;  LUMBAR LAMINECTOMY/DECOMPRESSION MICRODISCECTOMY 1 LEVEL   MASTECTOMY, PARTIAL Left 02/26/2003   ; S/P re-excision of cranial and lateral margins 04/19/2003.    RE-EXCISION OF BREAST CANCER,SUPERIOR MARGINS Right 10/27/2012   Procedure: RE-EXCISION OF BREAST CANCER,SUPERIOR and inferior MARGINS;  Surgeon: Robyne Askew, MD;  Location: [facility] OR;  Service: General;  Laterality: Right;   RE-EXCISION OF BREAST LUMPECTOMY Left 04/2003   TEE WITHOUT CARDIOVERSION N/A 04/04/2019   Procedure: Transesophageal Echocardiogram (Tee);  Surgeon: Linden Dolin, MD;  Location: [facility] OR;  Service: Open Heart Surgery;  Laterality: N/A;   THORACIC AORTIC ANEURYSM  REPAIR N/A 04/04/2019   Procedure: THORACIC ASCENDING ANEURYSM REPAIR (AAA)  USING 28 MM X 30 CM HEMASHIELD PLATINUM VASCULAR GRAFT;  Surgeon: Linden Dolin, MD;  Location: [facility] OR;  Service: Open Heart Surgery;  Laterality: N/A;    Social History:   reports that she quit smoking about 2 years ago. Her smoking use included cigarettes. She started smoking about 46 years ago. She has a 11 pack-year smoking history. She has never used smokeless tobacco. She reports that she does not currently use alcohol after a past usage of about 2.0 standard drinks of alcohol per week. She reports that she does not currently use drugs after having used the following drugs: Cocaine.  Allergies  Allergen Reactions   Shrimp [Shellfish Allergy] Shortness Of Breath   Bactroban [Mupirocin] Other (See Comments)    "Sores in nose"   Hydrocodone Itching, Nausea Only and Nausea And Vomiting   Chlorhexidine Gluconate Itching   Tylenol [Acetaminophen] Itching   Zestril [Lisinopril] Cough    Family History  Problem Relation Age of Onset   Colon cancer Mother 5   Hypertension Mother    Diabetes Sister 22   Hypertension Sister    Diabetes Brother    Hypertension Brother    Diabetes  Brother    Hypertension Brother    Kidney disease Son        s/p renal transplant   Hypertension Son    Diabetes Son    Multiple sclerosis Son    Bone cancer Sister 76   Breast cancer Neg Hx    Cervical cancer Neg Hx      Prior to Admission medications   Medication Sig Start Date End Date Taking? Authorizing Provider  Oxycodone HCl 10 MG TABS Take 10 mg by mouth 3 (three) times daily as needed (For more moderate pain). 02/02/23  Yes [provider]  traZODone (DESYREL) 50 MG tablet Take 25-50 mg by mouth at bedtime. 03/19/23  Yes [provider]  albuterol (VENTOLIN HFA) 108 (90 Base) MCG/ACT inhaler Inhale 2 puffs into the lungs every 6 (six) hours as needed for wheezing or shortness of breath. 07/30/20    Janeece Agee, NP  amLODipine (NORVASC) 10 MG tablet Take 1 tablet (10 mg total) by mouth daily. 03/06/22   Setzer, Lynnell Jude, PA-C  apixaban (ELIQUIS) 2.5 MG TABS tablet Take 1 tablet (2.5 mg total) by mouth 2 (two) times daily. 03/06/22   Setzer, Lynnell Jude, PA-C  carvedilol (COREG) 25 MG tablet Take 1 tablet (25 mg total) by mouth 2 (two) times daily with a meal. 05/01/22 05/02/23  Ghimire, Werner Lean, MD  Cholecalciferol (VITAMIN D-3 PO) Take 1 capsule by mouth daily.    [provider]  cloNIDine (CATAPRES) 0.1 MG tablet Take 1 tablet (0.1 mg total) by mouth 2 (two) times daily. 12/18/22   Corky Crafts, MD  hydrALAZINE (APRESOLINE) 100 MG tablet Take 1 tablet (100 mg total) by mouth every 8 (eight) hours. 12/18/22   Corky Crafts, MD  isosorbide mononitrate (IMDUR) 30 MG 24 hr tablet TAKE 1 TABLET (30MG ) ON DIALYSIS DAYS AND 2 TABLETS (60 MG) ON NON DIALYSIS DAYS. 01/12/23   Corky Crafts, MD  naloxone [facility]) nasal spray 4 mg/0.1 mL 1 spray as directed. 02/19/22   [provider]  oxyCODONE-acetaminophen (PERCOCET) 10-325 MG tablet Take 1 tablet by mouth 3 (three) times daily as needed for pain. 03/12/22   [provider]  pantoprazole (PROTONIX) 40 MG tablet TAKE 1 TABLET (40 MG TOTAL) BY MOUTH 2 (TWO) TIMES DAILY. Patient taking differently: Take 40 mg by mouth daily. 10/08/22   Georganna Skeans, MD  rosuvastatin (CRESTOR) 10 MG tablet TAKE 1 TABLET (10 MG TOTAL) BY MOUTH DAILY FOR CHOLESTEROL Patient taking differently: Take 10 mg by mouth daily. 08/07/22   Georganna Skeans, MD  senna-docusate (SENOKOT-S) 8.6-50 MG tablet Take 1 tablet by mouth 2 (two) times daily between meals as needed for mild constipation. 08/14/22   Almon Hercules, MD  sevelamer carbonate (RENVELA) 2.4 g PACK Take 2.4 g by mouth 3 (three) times daily with meals. 03/06/22   Milinda Antis, PA-C    Physical Exam: Vitals:   04/02/23 0230 04/02/23 0245 04/02/23 0300 04/02/23 0350  BP: (!)  226/117 (!) 217/128 (!) 224/124 (!) 210/126  Pulse:    76  Resp: (!) 29 (!) 31 (!) 31 (!) 24  Temp:      TempSrc:      SpO2:    100%  Weight:      Height:        Constitutional: NAD, no pallor or diaphoresis   Eyes: PERTLA, lids and conjunctivae normal ENMT: Mucous membranes are moist. Posterior pharynx clear of any exudate or lesions.   Neck:  supple, no masses  Respiratory: Fine rales b/l, no wheezing. Speaking full sentences.  Cardiovascular: S1 & S2 heard, regular rate and rhythm. JVD. Abdomen: No distension, no tenderness, soft. Bowel sounds active.  Musculoskeletal: no clubbing / cyanosis. No joint deformity upper and lower extremities.   Skin: no significant rashes, lesions, ulcers. Warm, dry, well-perfused. Neurologic: CN 2-12 grossly intact. Moving all extremities. Alert and oriented.  Psychiatric: Calm. Cooperative.    Labs and Imaging on Admission: I have personally reviewed following labs and imaging studies  CBC: Recent Labs  Lab 04/02/23 0208  WBC 7.3  NEUTROABS 5.5  HGB 9.1*  HCT 29.3*  MCV 97.0  PLT 215   Basic Metabolic Panel: Recent Labs  Lab 04/02/23 0208  NA 139  K 4.7  CL 99  CO2 23  GLUCOSE 102*  BUN 48*  CREATININE 9.87*  CALCIUM 9.0   GFR: Estimated Creatinine Clearance: 4.1 mL/min (A) (by C-G formula based on SCr of 9.87 mg/dL (H)). Liver Function Tests: No results for input(s): "AST", "ALT", "ALKPHOS", "BILITOT", "PROT", "ALBUMIN" in the last 168 hours. No results for input(s): "LIPASE", "AMYLASE" in the last 168 hours. No results for input(s): "AMMONIA" in the last 168 hours. Coagulation Profile: No results for input(s): "INR", "PROTIME" in the last 168 hours. Cardiac Enzymes: No results for input(s): "CKTOTAL", "CKMB", "CKMBINDEX", "TROPONINI" in the last 168 hours. BNP (last 3 results) No results for input(s): "PROBNP" in the last 8760 hours. HbA1C: No results for input(s): "HGBA1C" in the last 72 hours. CBG: No results for  input(s): "GLUCAP" in the last 168 hours. Lipid Profile: No results for input(s): "CHOL", "HDL", "LDLCALC", "TRIG", "CHOLHDL", "LDLDIRECT" in the last 72 hours. Thyroid Function Tests: No results for input(s): "TSH", "T4TOTAL", "FREET4", "T3FREE", "THYROIDAB" in the last 72 hours. Anemia Panel: No results for input(s): "VITAMINB12", "FOLATE", "FERRITIN", "TIBC", "IRON", "RETICCTPCT" in the last 72 hours. Urine analysis:    Component Value Date/Time   COLORURINE AMBER (A) 12/27/2021 1840   APPEARANCEUR TURBID (A) 12/27/2021 1840   LABSPEC 1.013 12/27/2021 1840   PHURINE 8.0 12/27/2021 1840   GLUCOSEU NEGATIVE 12/27/2021 1840   GLUCOSEU NEG mg/dL 81/19/1478 2956   HGBUR MODERATE (A) 12/27/2021 1840   BILIRUBINUR NEGATIVE 12/27/2021 1840   BILIRUBINUR negative 05/04/2018 0910   KETONESUR NEGATIVE 12/27/2021 1840   PROTEINUR >=300 (A) 12/27/2021 1840   UROBILINOGEN 0.2 05/04/2018 0910   UROBILINOGEN 1 02/14/2014 0946   NITRITE NEGATIVE 12/27/2021 1840   LEUKOCYTESUR LARGE (A) 12/27/2021 1840   Sepsis Labs: @LABRCNTIP (procalcitonin:4,lacticidven:4) )No results found for this or any previous visit (from the past 240 hour(s)).   Radiological Exams on Admission: DG Chest 2 View  Result Date: 04/02/2023 CLINICAL DATA:  Shortness of breath EXAM: CHEST - 2 VIEW COMPARISON:  01/27/2023 FINDINGS: Post sternotomy changes. Right-sided central venous catheter with tips at the right atrium. Cardiomegaly with vascular congestion and pulmonary edema, similar compared to prior exam. More confluent airspace disease at the bases could reflect edema or pneumonia. Small pleural effusions. Aortic atherosclerosis. No pneumothorax. IMPRESSION: Cardiomegaly with vascular congestion and pulmonary edema. More confluent airspace disease at the bases could reflect edema or pneumonia. Small pleural effusions. Findings do not appear significantly changed compared to prior. Electronically Signed   By: Jasmine Pang  M.D.   On: 04/02/2023 03:25    EKG: Independently reviewed. Sinus rhythm, RBBB, QTc 556.   Assessment/Plan   1. Acute pulmonary edema; acute hypoxic respiratory failure; ESRD; chronic HFmrEF  - Presents with acute hypoxic  respiratory failure and found to be hypervolemic with pulmonary edema in setting of missed HD  - Nephrology consulted by ED and pt started on BiPAP  - Continue BiPAP as-needed, restrict fluids, renally-dose medications, monitor weight and I/Os   2. Hypertensive crisis  - Start nitroglycerin infusion with goal reduction in BP by 25% for tonight (goal MAP 120), resume home antihypertensives once off of BiPAP   3. PAF  - Continue Eliquis and Coreg    4. Hx of CVA  - Continue Eliquis and Crestor    5. Chronic back pain  - Prescription database reviewed, plan to continue Percocet    DVT prophylaxis: Eliquis  Code Status: Full  Level of Care: Level of care: Progressive Family Communication: None present   Disposition Plan:  Patient is from: Home  Anticipated d/c is to: TBD Anticipated d/c date is: 04/05/23  Patient currently: Pending improved volume status and respiratory status, BP-control  Consults called: nephrology  Admission status: Inpatient    Briscoe Deutscher, MD [facility]  04/02/2023, 4:32 AM

## 2023-04-02 NOTE — Progress Notes (Signed)
Pt receives out-pt HD at [facility] [place] on [facility]TS. Will assist as needed.   Olivia Canter Renal Navigator [phone]

## 2023-04-02 NOTE — ED Notes (Signed)
ED TO INPATIENT HANDOFF REPORT  ED Nurse Name and Phone #: Grover Canavan [phone]  E Name/Age/Gender Monique Henderson 72 y.o. female Room/Bed: 009C/009C  Code Status   Code Status: Full Code  Home/SNF/Other Home Patient oriented to: self, place, time, and situation Is this baseline? Yes   Triage Complete: Triage complete  Chief Complaint Acute respiratory failure with hypoxia [facility]) [J96.01]  Triage Note Patient missed Dialysia on 04/01/23, she is C/O [facility] and lower back pain, reports cough and chills for 1 week. Dialysis schedule Tues, Thurs, Sat   Allergies Allergies  Allergen Reactions   Shrimp [Shellfish Allergy] Shortness Of Breath   Bactroban [Mupirocin] Other (See Comments)    "Sores in nose"   Hydrocodone Itching, Nausea Only and Nausea And Vomiting   Chlorhexidine Gluconate Itching   Tylenol [Acetaminophen] Itching   Zestril [Lisinopril] Cough    Level of Care/Admitting Diagnosis ED Disposition     ED Disposition  Admit   Condition  --   Comment  Hospital Area: [facility] [[id number]]  Level of Care: Progressive [102]  Admit to Progressive based on following criteria: MULTISYSTEM THREATS such as stable sepsis, metabolic/electrolyte imbalance with or without encephalopathy that is responding to early treatment.  May admit patient to [facility] or [facility] if equivalent level of care is available:: No  Covid Evaluation: Asymptomatic - no recent exposure (last 10 days) testing not required  Diagnosis: Acute respiratory failure with hypoxia [facility]) [[id number]]  Admitting Physician: Briscoe Deutscher [[id number]]  Attending Physician: Briscoe Deutscher [[id number]]  Certification:: I certify this patient will need inpatient services for at least 2 midnights  Expected Medical Readiness: 04/05/2023          B Medical/Surgery History Past Medical History:  Diagnosis Date   AF (paroxysmal atrial fibrillation) (HCC) 05/29/2019   on Coumadin   Aortic  atherosclerosis (HCC) 07/05/2019   Aortic dissection (HCC) 04/04/2019   s/p repair   Bone spur 2008   Right calcaneal foot spur   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 Chevis Pretty; S/P re-excision of superior and inferior margins 10/27/2012.    ESRD on hemodialysis (HCC) 05/29/2019   Essential hypertension 09/16/2006   GERD 09/16/2006   Hepatitis C    treated and RNA confirmed not detectable 01/2017   Insomnia 03/14/2015   Malnutrition of moderate degree 05/19/2019   Non compliance with medical treatment 12/04/2017   Normocytic anemia    With thrombocytosis   Osteoarthritis    Right ureteral stone 2002   S/P lumbar spinal fusion 01/18/2014   S/P lumbar decompressive laminectomy, fusion, and plating for lumbar spinal stenosis on 05/27/2009 by Dr. Tia Alert.  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 Tia Alert.     Stroke (cerebrum) (HCC)    Tobacco use disorder 04/19/2009   Uterine fibroid    Wears dentures  top   Past Surgical History:  Procedure Laterality Date   ANTERIOR LAT LUMBAR FUSION N/A 01/18/2014   Procedure: ANTERIOR LATERAL LUMBAR FUSION LUMBAR TWO-THREE;  Surgeon: Tia Alert, MD;  Location: [facility];  Service: Neurosurgery;  Laterality: N/A;   ANTERIOR LUMBAR FUSION  01/18/2014    AV FISTULA PLACEMENT Left 04/20/2019   Procedure: ARTERIOVENOUS (AV) FISTULA CREATION;  Surgeon: Maeola Harman, MD;  Location: [facility] OR;  Service: Vascular;  Laterality: Left;   BACK SURGERY     BREAST LUMPECTOMY Left 01/2003   BREAST LUMPECTOMY Right 2014   BREAST LUMPECTOMY WITH NEEDLE LOCALIZATION AND AXILLARY SENTINEL LYMPH NODE BX Right 10/13/2012   Procedure: BREAST LUMPECTOMY WITH NEEDLE LOCALIZATION;  Surgeon: Robyne Askew, MD;  Location: [facility];  Service: General;  Laterality: Right;  Right breast wire localized lumpectomy   INSERTION OF DIALYSIS CATHETER Right 04/20/2019   Procedure: INSERTION OF DIALYSIS CATHETER, right internal jugular;  Surgeon: Maeola Harman, MD;  Location: [facility] OR;  Service: Vascular;  Laterality: Right;   INSERTION OF DIALYSIS CATHETER Right 09/24/2020   Procedure: INSERTION OF TUNNELED DIALYSIS CATHETER;  Surgeon: Larina Earthly, MD;  Location: [facility] OR;  Service: Vascular;  Laterality: Right;   IR FLUORO GUIDE CV LINE RIGHT  09/22/2020   IR THORACENTESIS ASP PLEURAL SPACE W/IMG GUIDE  05/19/2019   IR US GUIDE VASC ACCESS LEFT  09/22/2020   IR US GUIDE VASC ACCESS RIGHT  09/22/2020   IR VENOCAVAGRAM SVC  09/22/2020   LAMINECTOMY  05/27/2009   Lumbar decompressive laminectomy, fusion and plating for lumbar spinal stensosis   LIGATION OF ARTERIOVENOUS  FISTULA Left 09/24/2020   Procedure: LIGATION OF LEFT ARM ARTERIOVENOUS  FISTULA;  Surgeon: Larina Earthly, MD;  Location: [facility] OR;  Service: Vascular;  Laterality: Left;   LUMBAR LAMINECTOMY/DECOMPRESSION MICRODISCECTOMY Left 03/23/2013   Procedure: LUMBAR LAMINECTOMY/DECOMPRESSION MICRODISCECTOMY 1 LEVEL;  Surgeon: Tia Alert, MD;  Location: [facility];  Service: Neurosurgery;  Laterality: Left;  LUMBAR LAMINECTOMY/DECOMPRESSION MICRODISCECTOMY 1 LEVEL   MASTECTOMY, PARTIAL Left 02/26/2003   ; S/P re-excision of cranial and lateral margins 04/19/2003.    RE-EXCISION OF BREAST  CANCER,SUPERIOR MARGINS Right 10/27/2012   Procedure: RE-EXCISION OF BREAST CANCER,SUPERIOR and inferior MARGINS;  Surgeon: Robyne Askew, MD;  Location: [facility] OR;  Service: General;  Laterality: Right;   RE-EXCISION OF BREAST LUMPECTOMY Left 04/2003   TEE WITHOUT CARDIOVERSION N/A 04/04/2019   Procedure: Transesophageal Echocardiogram (Tee);  Surgeon: Linden Dolin, MD;  Location: [facility] OR;  Service: Open Heart Surgery;  Laterality: N/A;   THORACIC AORTIC ANEURYSM REPAIR N/A 04/04/2019   Procedure: THORACIC ASCENDING ANEURYSM REPAIR (AAA)  USING 28 MM X 30 CM HEMASHIELD PLATINUM VASCULAR GRAFT;  Surgeon: Linden Dolin, MD;  Location: [facility] OR;  Service: Open Heart Surgery;  Laterality: N/A;     A IV Location/Drains/Wounds Patient Lines/Drains/Airways Status     Active Line/Drains/Airways     Name Placement date Placement time Site Days   Peripheral IV 04/02/23 20 G Anterior;Distal;Left Wrist 04/02/23  0206  Wrist  less than 1   Hemodialysis Catheter Right Subclavian --  --  Subclavian  --   Hemodialysis Catheter Right Subclavian --  --  Subclavian  --            Intake/Output Last 24 hours  Intake/Output Summary (Last 24 hours) at 04/02/2023 1226 Last data filed at 04/02/2023 1155 Gross per 24 hour  Intake 111.85 ml  Output --  Net 111.85 ml    Labs/Imaging Results for orders placed or performed during the hospital encounter of 04/02/23 (from the past 48 hour(s))  Basic metabolic panel     Status: Abnormal   Collection Time: 04/02/23  2:08 AM  Result Value Ref Range   Sodium 139 135 - 145 mmol/L   Potassium 4.7 3.5 - 5.1 mmol/L   Chloride 99 98 - 111 mmol/L   CO2 23 22 - 32 mmol/L   Glucose, Bld 102 (H) 70 - 99 mg/dL    Comment: Glucose reference range applies only to samples taken after fasting for at least 8 hours.   BUN 48 (H) 8 - 23 mg/dL   Creatinine, Ser 0.98 (H) 0.44 - 1.00 mg/dL   Calcium 9.0 8.9 - 11.9 mg/dL   GFR, Estimated 4 (L) >60 mL/min    Comment:  (NOTE) Calculated using the CKD-EPI Creatinine Equation (2021)    Anion gap 17 (H) 5 - 15    Comment: Performed at [facility], [address] [place], [place] [address]  CBC with Differential     Status: Abnormal   Collection Time: 04/02/23  2:08 AM  Result Value Ref Range   WBC 7.3 4.0 - 10.5 K/uL   RBC 3.02 (L) 3.87 - 5.11 MIL/uL   Hemoglobin 9.1 (L) 12.0 - 15.0 g/dL   HCT 95.6 (L) 21.3 - 08.6 %   MCV 97.0 80.0 - 100.0 fL   MCH 30.1 26.0 - 34.0 pg   MCHC 31.1 30.0 - 36.0 g/dL   RDW 57.8 (H) 46.9 - 62.9 %   Platelets 215 150 - 400 K/uL   nRBC 0.0 0.0 - 0.2 %   Neutrophils Relative % 76 %   Neutro Abs 5.5 1.7 - 7.7 K/uL   Lymphocytes Relative 12 %   Lymphs Abs 0.8 0.7 - 4.0 K/uL   Monocytes Relative 8 %   Monocytes Absolute 0.6 0.1 - 1.0 K/uL   Eosinophils Relative 3 %   Eosinophils Absolute 0.3 0.0 - 0.5 K/uL   Basophils Relative 1 %   Basophils Absolute 0.1 0.0 - 0.1 K/uL   Immature Granulocytes 0 %   Abs Immature Granulocytes 0.03 0.00 - 0.07 K/uL    Comment: Performed at [facility], [address], [place], [place] [address]  Brain natriuretic peptide     Status: Abnormal   Collection Time: 04/02/23  2:08 AM  Result Value Ref Range   B Natriuretic Peptide >4,500.0 (H) 0.0 - 100.0 pg/mL    Comment: Performed at [facility], [address] [place], [place] [address]  Troponin I (High Sensitivity)     Status: Abnormal   Collection Time: 04/02/23  2:08 AM  Result Value Ref Range   Troponin I (High Sensitivity) 51 (H) <18 ng/L    Comment: (NOTE) Elevated high sensitivity troponin I (hsTnI) values and significant  changes across serial measurements may suggest ACS but many other  chronic and acute conditions are known to elevate hsTnI results.  Refer to the "Links" section for chest pain algorithms and additional  guidance. Performed at [facility] [facility], [address] [place], [place] [address]   Troponin I (High Sensitivity)     Status:  Abnormal   Collection Time: 04/02/23  4:17 AM  Result Value Ref Range   Troponin I (High Sensitivity) 63 (H) <18 ng/L    Comment: (NOTE) Elevated high sensitivity troponin I (hsTnI) values and significant  changes across serial measurements may suggest ACS but many other  chronic and acute conditions are known to elevate hsTnI results.  Refer to the "Links" section for chest pain algorithms and additional  guidance. Performed at [facility], [address] [place], [place] [address]   Basic metabolic panel     Status: Abnormal   Collection Time: 04/02/23  5:56 AM  Result Value Ref Range   Sodium 138 135 - 145 mmol/L   Potassium 4.8 3.5 - 5.1 mmol/L   Chloride 97 (L) 98 - 111 mmol/L   CO2 25 22 - 32 mmol/L   Glucose, Bld 97 70 - 99 mg/dL    Comment: Glucose reference range applies only to samples taken after fasting for at least 8 hours.   BUN 52 (H) 8 - 23 mg/dL   Creatinine, Ser 60.45 (H) 0.44 - 1.00 mg/dL   Calcium 8.9 8.9 - 40.9 mg/dL   GFR, Estimated 4 (L) >60 mL/min    Comment: (NOTE) Calculated using the CKD-EPI Creatinine Equation (2021)    Anion gap 16 (H) 5 - 15    Comment: Performed at [facility], [address], [place], [place] [address]  CBC     Status: Abnormal   Collection Time: 04/02/23  5:56 AM  Result Value Ref Range   WBC 6.9 4.0 - 10.5 K/uL   RBC 2.82 (L) 3.87 - 5.11 MIL/uL   Hemoglobin 8.4 (L) 12.0 - 15.0 g/dL   HCT 47.8 (L) 29.5 - 62.1 %   MCV 96.8 80.0 - 100.0 fL   MCH 29.8 26.0 - 34.0 pg   MCHC 30.8 30.0 - 36.0 g/dL   RDW 30.8 (H) 65.7 - 84.6 %   Platelets 209 150 - 400 K/uL   nRBC 0.0 0.0 - 0.2 %    Comment: Performed at [facility], [address], [place], [place] [address]   *Note: Due to a large number of results and/or encounters for the requested time period, some results have not been displayed. A complete set of results can be found in Results Review.   DG Chest 2 View  Result Date: 04/02/2023 CLINICAL DATA:   Shortness of breath EXAM: CHEST - 2 VIEW COMPARISON:  01/27/2023 FINDINGS: Post sternotomy changes. Right-sided central venous catheter with tips at the right atrium. Cardiomegaly with vascular congestion and pulmonary edema, similar compared to prior exam. More confluent airspace disease at the bases could reflect edema or pneumonia. Small pleural effusions. Aortic atherosclerosis. No pneumothorax. IMPRESSION: Cardiomegaly with vascular congestion and pulmonary edema. More confluent airspace disease at the bases could reflect edema or pneumonia. Small pleural effusions. Findings do not appear significantly changed compared to prior. Electronically Signed   By: Jasmine Pang M.D.   On: 04/02/2023 03:25    Pending Labs Unresulted Labs (From admission, onward)     Start     Ordered   04/02/23 1143  Albumin  Add-on,   AD        04/02/23 1142   04/02/23 1143  Phosphorus  Add-on,   AD        04/02/23 1142   04/02/23 1112  Hepatitis B surface antigen  (New Admission Hemo Labs (Hepatitis B))  Once,   R        04/02/23 1113   04/02/23 1112  Hepatitis B surface antibody,quantitative  (New Admission Hemo Labs (Hepatitis B))  Once,   R        04/02/23 1113  04/02/23 0500  Basic metabolic panel  Daily,   R      04/02/23 0432   04/02/23 0500  CBC  Daily,   R      04/02/23 0432            Vitals/Pain Today's Vitals   04/02/23 1030 04/02/23 1045 04/02/23 1100 04/02/23 1130  BP: 137/82 (!) 146/83 (!) 144/81 139/78  Pulse:      Resp: (!) 22 (!) 21 (!) 21 20  Temp:      TempSrc:      SpO2:      Weight:      Height:      PainSc:        Isolation Precautions No active isolations  Medications Medications  sodium chloride flush (NS) 0.9 % injection 3 mL (3 mLs Intravenous Not Given 04/02/23 0926)  senna (SENOKOT) tablet 8.6 mg (has no administration in time range)  fentaNYL (SUBLIMAZE) injection 25-50 mcg (50 mcg Intravenous Given 04/02/23 0534)  amLODipine (NORVASC) tablet 10 mg (10 mg Oral  Given 04/02/23 0915)  carvedilol (COREG) tablet 25 mg (25 mg Oral Given 04/02/23 0916)  cloNIDine (CATAPRES) tablet 0.1 mg (0.1 mg Oral Given 04/02/23 0916)  hydrALAZINE (APRESOLINE) tablet 100 mg (100 mg Oral Given 04/02/23 0538)  rosuvastatin (CRESTOR) tablet 10 mg (10 mg Oral Given 04/02/23 0916)  traZODone (DESYREL) tablet 25-50 mg (has no administration in time range)  pantoprazole (PROTONIX) EC tablet 40 mg (40 mg Oral Given 04/02/23 0916)  sevelamer carbonate (RENVELA) powder PACK 2.4 g (2.4 g Oral Patient Refused/Not Given 04/02/23 1157)  apixaban (ELIQUIS) tablet 2.5 mg (2.5 mg Oral Given 04/02/23 0923)  oxyCODONE-acetaminophen (PERCOCET/ROXICET) 5-325 MG per tablet 1 tablet (has no administration in time range)    And  oxyCODONE (Oxy IR/ROXICODONE) immediate release tablet 5 mg (5 mg Oral Given 04/02/23 0534)  isosorbide mononitrate (IMDUR) 24 hr tablet 30 mg (has no administration in time range)    And  isosorbide mononitrate (IMDUR) 24 hr tablet 60 mg (60 mg Oral Given 04/02/23 0916)  nitroGLYCERIN (NITROGLYN) 2 % ointment 0.5 inch (0.5 inches Topical Given 04/02/23 1155)  cinacalcet (SENSIPAR) tablet 60 mg (has no administration in time range)  doxercalciferol (HECTOROL) injection 4 mcg (has no administration in time range)  nitroGLYCERIN (NITROGLYN) 2 % ointment 1 inch (1 inch Topical Given 04/02/23 0205)  furosemide (LASIX) injection 60 mg (60 mg Intravenous Given 04/02/23 0206)    Mobility walks with person assist     Focused Assessments Pulmonary Assessment Handoff:  Lung sounds: Bilateral Breath Sounds: Rales O2 Device: Nasal Cannula O2 Flow Rate (L/min): 3 L/min    R Recommendations: See Admitting Provider Note  Report given to:   Additional Notes:

## 2023-04-02 NOTE — Procedures (Signed)
I have reviewed the HD regimen and made appropriate changes.  Vinson Moselle MD  [facility] 04/02/2023, 2:25 PM

## 2023-04-02 NOTE — Progress Notes (Signed)
   04/02/23 0049  Spiritual Encounters  Type of Visit Initial  Care provided to: Patient  Referral source Trauma page  Reason for visit Trauma  OnCall Visit Yes  Advance Directives (For Healthcare)  Does Patient Have a Medical Advance Directive? No  Would patient like information on creating a medical advance directive? No - Patient declined  Mental Health Advance Directives  Does Patient Have a Mental Health Advance Directive? No  Would patient like information on creating a mental health advance directive? No - Patient declined   Ch responded to trauma level II page. There was no family present at bedside. Ch provided hospitality and support. CH remains available when needed.

## 2023-04-02 NOTE — ED Notes (Signed)
Pt off bipap and placed on 3 liters n/c

## 2023-04-02 NOTE — ED Triage Notes (Signed)
Patient missed Dialysia on 04/01/23, she is C/O [facility] and lower back pain, reports cough and chills for 1 week. Dialysis schedule [facility], Sat

## 2023-04-02 NOTE — ED Provider Notes (Signed)
EMERGENCY DEPARTMENT AT [facility] Provider Note   CSN: [id number] Arrival date & time: 04/02/23  0041     History  Chief Complaint  Patient presents with   Shortness of Breath   Back Pain    Monique Henderson is a 72 y.o. female.  Patient presents to the emergency room via EMS complaining of shortness of breath.  Patient reports missing dialysis today.  She states she went to her session as usual on Tuesday.  Upon arrival patient saturation was 89% on room air.  She was noted to be hypertensive with a pressure of 218/136.  She denies chest pain, abdominal pain, nausea, vomiting.  She does report flares of her chronic back pain.  The patient still produces urine.  Past medical history significant for end-stage renal disease on dialysis, chronic low back pain, aortic dissection, atrial fibrillation on Eliquis, history of CVA, heart failure with reduced ejection fraction, right hemiparesis, hepatitis C   Shortness of Breath Back Pain      Home Medications Prior to Admission medications   Medication Sig Start Date End Date Taking? Authorizing Provider  Oxycodone HCl 10 MG TABS Take 10 mg by mouth 3 (three) times daily as needed (For more moderate pain). 02/02/23  Yes [provider]  traZODone (DESYREL) 50 MG tablet Take 25-50 mg by mouth at bedtime. 03/19/23  Yes [provider]  albuterol (VENTOLIN HFA) 108 (90 Base) MCG/ACT inhaler Inhale 2 puffs into the lungs every 6 (six) hours as needed for wheezing or shortness of breath. 07/30/20   Janeece Agee, NP  amLODipine (NORVASC) 10 MG tablet Take 1 tablet (10 mg total) by mouth daily. 03/06/22   Setzer, Lynnell Jude, PA-C  apixaban (ELIQUIS) 2.5 MG TABS tablet Take 1 tablet (2.5 mg total) by mouth 2 (two) times daily. 03/06/22   Setzer, Lynnell Jude, PA-C  carvedilol (COREG) 25 MG tablet Take 1 tablet (25 mg total) by mouth 2 (two) times daily with a meal. 05/01/22 05/02/23  Ghimire, Werner Lean, MD   Cholecalciferol (VITAMIN D-3 PO) Take 1 capsule by mouth daily.    [provider]  cloNIDine (CATAPRES) 0.1 MG tablet Take 1 tablet (0.1 mg total) by mouth 2 (two) times daily. 12/18/22   Corky Crafts, MD  hydrALAZINE (APRESOLINE) 100 MG tablet Take 1 tablet (100 mg total) by mouth every 8 (eight) hours. 12/18/22   Corky Crafts, MD  isosorbide mononitrate (IMDUR) 30 MG 24 hr tablet TAKE 1 TABLET (30MG ) ON DIALYSIS DAYS AND 2 TABLETS (60 MG) ON NON DIALYSIS DAYS. 01/12/23   Corky Crafts, MD  naloxone [facility]) nasal spray 4 mg/0.1 mL 1 spray as directed. 02/19/22   [provider]  oxyCODONE-acetaminophen (PERCOCET) 10-325 MG tablet Take 1 tablet by mouth 3 (three) times daily as needed for pain. 03/12/22   [provider]  pantoprazole (PROTONIX) 40 MG tablet TAKE 1 TABLET (40 MG TOTAL) BY MOUTH 2 (TWO) TIMES DAILY. Patient taking differently: Take 40 mg by mouth daily. 10/08/22   Georganna Skeans, MD  rosuvastatin (CRESTOR) 10 MG tablet TAKE 1 TABLET (10 MG TOTAL) BY MOUTH DAILY FOR CHOLESTEROL Patient taking differently: Take 10 mg by mouth daily. 08/07/22   Georganna Skeans, MD  senna-docusate (SENOKOT-S) 8.6-50 MG tablet Take 1 tablet by mouth 2 (two) times daily between meals as needed for mild constipation. 08/14/22   Almon Hercules, MD  sevelamer carbonate (RENVELA) 2.4 g PACK Take 2.4 g by mouth 3 (  three) times daily with meals. 03/06/22   Setzer, Lynnell Jude, PA-C      Allergies    Shrimp [shellfish allergy], Bactroban [mupirocin], Hydrocodone, Chlorhexidine gluconate, Tylenol [acetaminophen], and Zestril [lisinopril]    Review of Systems   Review of Systems  Respiratory:  Positive for shortness of breath.   Musculoskeletal:  Positive for back pain.    Physical Exam Updated Vital Signs BP (!) 210/116   Pulse 74   Temp 98 F (36.7 C) (Axillary)   Resp (!) 23   Ht 5\' 3"  (1.6 m)   Wt 49.4 kg   SpO2 100%   BMI 19.31 kg/m  Physical  Exam Vitals and nursing note reviewed.  Constitutional:      General: She is not in acute distress.    Appearance: She is well-developed.  HENT:     Head: Normocephalic and atraumatic.  Eyes:     Conjunctiva/sclera: Conjunctivae normal.  Cardiovascular:     Rate and Rhythm: Normal rate and regular rhythm.  Pulmonary:     Effort: Respiratory distress present.     Breath sounds: Examination of the right-upper field reveals rales. Examination of the left-upper field reveals rales. Examination of the right-middle field reveals rales. Examination of the left-middle field reveals rales. Examination of the right-lower field reveals decreased breath sounds. Examination of the left-lower field reveals rales. Decreased breath sounds and rales present.  Chest:     Chest wall: No tenderness.  Abdominal:     Palpations: Abdomen is soft.     Tenderness: There is no abdominal tenderness.  Musculoskeletal:        General: No swelling.     Cervical back: Neck supple.     Right lower leg: No edema.     Left lower leg: No edema.  Skin:    General: Skin is warm and dry.     Capillary Refill: Capillary refill takes less than 2 seconds.  Neurological:     Mental Status: She is alert.     Comments: No obvious facial asymmetry, follows two-step commands, no obvious unilateral weakness  Psychiatric:        Mood and Affect: Mood normal.     ED Results / Procedures / Treatments   Labs (all labs ordered are listed, but only abnormal results are displayed) Labs Reviewed  BASIC METABOLIC PANEL - Abnormal; Notable for the following components:      Result Value   Glucose, Bld 102 (*)    BUN 48 (*)    Creatinine, Ser 9.87 (*)    GFR, Estimated 4 (*)    Anion gap 17 (*)    All other components within normal limits  CBC WITH DIFFERENTIAL/PLATELET - Abnormal; Notable for the following components:   RBC 3.02 (*)    Hemoglobin 9.1 (*)    HCT 29.3 (*)    RDW 16.6 (*)    All other components within  normal limits  BRAIN NATRIURETIC PEPTIDE - Abnormal; Notable for the following components:   B Natriuretic Peptide >4,500.0 (*)    All other components within normal limits  TROPONIN I (HIGH SENSITIVITY) - Abnormal; Notable for the following components:   Troponin I (High Sensitivity) 51 (*)    All other components within normal limits  TROPONIN I (HIGH SENSITIVITY) - Abnormal; Notable for the following components:   Troponin I (High Sensitivity) 63 (*)    All other components within normal limits  BASIC METABOLIC PANEL  CBC    EKG EKG Interpretation Date/Time:  Friday April 02 2023 00:43:19 EDT Ventricular Rate:  98 PR Interval:  164 QRS Duration:  150 QT Interval:  435 QTC Calculation: 556 R Axis:   72  Text Interpretation: Sinus rhythm Probable left atrial enlargement Right bundle branch block When compared with ECG of 01/28/2023, No significant change was found Confirmed by Dione Booze ([id number]) on 04/02/2023 1:29:00 AM  Radiology DG Chest 2 View  Result Date: 04/02/2023 CLINICAL DATA:  Shortness of breath EXAM: CHEST - 2 VIEW COMPARISON:  01/27/2023 FINDINGS: Post sternotomy changes. Right-sided central venous catheter with tips at the right atrium. Cardiomegaly with vascular congestion and pulmonary edema, similar compared to prior exam. More confluent airspace disease at the bases could reflect edema or pneumonia. Small pleural effusions. Aortic atherosclerosis. No pneumothorax. IMPRESSION: Cardiomegaly with vascular congestion and pulmonary edema. More confluent airspace disease at the bases could reflect edema or pneumonia. Small pleural effusions. Findings do not appear significantly changed compared to prior. Electronically Signed   By: Jasmine Pang M.D.   On: 04/02/2023 03:25    Procedures .Critical Care  Performed by: Darrick Grinder, PA-C Authorized by: Darrick Grinder, PA-C   Critical care provider statement:    Critical care time (minutes):  75   Critical  care was necessary to treat or prevent imminent or life-threatening deterioration of the following conditions:  Renal failure and respiratory failure   Critical care was time spent personally by me on the following activities:  Development of treatment plan with patient or surrogate, discussions with consultants, evaluation of patient's response to treatment, examination of patient, ordering and review of laboratory studies, ordering and review of radiographic studies, ordering and performing treatments and interventions, pulse oximetry, re-evaluation of patient's condition and review of old charts   Care discussed with: admitting provider       Medications Ordered in ED Medications  nitroGLYCERIN 50 mg in dextrose 5 % 250 mL (0.2 mg/mL) infusion (25 mcg/min Intravenous Rate/Dose Change 04/02/23 0517)  sodium chloride flush (NS) 0.9 % injection 3 mL (has no administration in time range)  senna (SENOKOT) tablet 8.6 mg (has no administration in time range)  fentaNYL (SUBLIMAZE) injection 25-50 mcg (50 mcg Intravenous Given 04/02/23 0534)  amLODipine (NORVASC) tablet 10 mg (has no administration in time range)  carvedilol (COREG) tablet 25 mg (has no administration in time range)  cloNIDine (CATAPRES) tablet 0.1 mg (has no administration in time range)  hydrALAZINE (APRESOLINE) tablet 100 mg (100 mg Oral Given 04/02/23 0538)  rosuvastatin (CRESTOR) tablet 10 mg (has no administration in time range)  traZODone (DESYREL) tablet 25-50 mg (has no administration in time range)  pantoprazole (PROTONIX) EC tablet 40 mg (has no administration in time range)  sevelamer carbonate (RENVELA) powder PACK 2.4 g (has no administration in time range)  apixaban (ELIQUIS) tablet 2.5 mg (has no administration in time range)  oxyCODONE-acetaminophen (PERCOCET/ROXICET) 5-325 MG per tablet 1 tablet (has no administration in time range)    And  oxyCODONE (Oxy IR/ROXICODONE) immediate release tablet 5 mg (5 mg Oral Given  04/02/23 0534)  isosorbide mononitrate (IMDUR) 24 hr tablet 30 mg (has no administration in time range)    And  isosorbide mononitrate (IMDUR) 24 hr tablet 60 mg (has no administration in time range)  nitroGLYCERIN (NITROGLYN) 2 % ointment 1 inch (1 inch Topical Given 04/02/23 0205)  furosemide (LASIX) injection 60 mg (60 mg Intravenous Given 04/02/23 0206)    ED Course/ Medical Decision Making/ A&P  Medical Decision Making Amount and/or Complexity of Data Reviewed Labs: ordered. Radiology: ordered.  Risk Prescription drug management. Decision regarding hospitalization.   This patient presents to the ED for concern of shortness of breath, this involves an extensive number of treatment options, and is a complaint that carries with it a high risk of complications and morbidity.  The differential diagnosis includes fluid overload due to renal failure, pneumonia, pulmonary embolism, others   Co morbidities that complicate the patient evaluation  Renal failure, atrial fibrillation   Additional history obtained:  Additional history obtained from EMS External records from outside source obtained and reviewed including discharge summary from recent hospitalization for similar presentation in August of this year   Lab Tests:  I Ordered, and personally interpreted labs.  The pertinent results include: BNP 4500+, troponin 51 (improved from last admission), creatinine 9.87, hemoglobin 9.1 (improved from previous)   Imaging Studies ordered:  I ordered imaging studies including chest x-ray I independently visualized and interpreted imaging which showed  Cardiomegaly with vascular congestion and pulmonary edema. More  confluent airspace disease at the bases could reflect edema or  pneumonia. Small pleural effusions.   I agree with the radiologist interpretation   Cardiac Monitoring: / EKG:  The patient was maintained on a cardiac monitor.  I  personally viewed and interpreted the cardiac monitored which showed an underlying rhythm of: Sinus rhythm   Consultations Obtained:  I requested consultation with nephrology, Dr.Coladonato, who agrees to add the patient to the schedule for emergent hemodialysis. I requested consultation with the hospitalist, Dr.Opyd,  and discussed lab and imaging findings as well as pertinent plan - they recommend: Admission, request medication to get MAP pressure down to 120   Problem List / ED Course / Critical interventions / Medication management   I ordered medication including Lasix, Nitropaste, nitroglycerin infusion for hypertension and fluid overload, BiPAP for respiratory distress Reevaluation of the patient after these medicines showed that the patient improved I have reviewed the patients home medicines and have made adjustments as needed  Test / Admission - Considered:  Patient with pulmonary edema in the setting of missed hemodialysis.  Patient requires admission for emergent hemodialysis         Final Clinical Impression(s) / ED Diagnoses Final diagnoses:  Acute respiratory failure with hypoxia [facility])    Rx / DC Orders ED Discharge Orders     None         Pamala Duffel 04/02/23 1610    Dione Booze, MD 04/02/23 [phone]

## 2023-04-03 DIAGNOSIS — J9601 Acute respiratory failure with hypoxia: Secondary | ICD-10-CM | POA: Diagnosis not present

## 2023-04-03 DIAGNOSIS — J9600 Acute respiratory failure, unspecified whether with hypoxia or hypercapnia: Secondary | ICD-10-CM | POA: Diagnosis present

## 2023-04-03 LAB — CBC
HCT: 26.1 % — ABNORMAL LOW (ref 36.0–46.0)
Hemoglobin: 8 g/dL — ABNORMAL LOW (ref 12.0–15.0)
MCH: 29.7 pg (ref 26.0–34.0)
MCHC: 30.7 g/dL (ref 30.0–36.0)
MCV: 97 fL (ref 80.0–100.0)
Platelets: 224 10*3/uL (ref 150–400)
RBC: 2.69 MIL/uL — ABNORMAL LOW (ref 3.87–5.11)
RDW: 16.1 % — ABNORMAL HIGH (ref 11.5–15.5)
WBC: 4.2 10*3/uL (ref 4.0–10.5)
nRBC: 0 % (ref 0.0–0.2)

## 2023-04-03 LAB — BASIC METABOLIC PANEL
Anion gap: 17 — ABNORMAL HIGH (ref 5–15)
Anion gap: 13 (ref 5–15)
BUN: 23 mg/dL (ref 8–23)
BUN: 13 mg/dL (ref 8–23)
CO2: 25 mmol/L (ref 22–32)
CO2: 28 mmol/L (ref 22–32)
Calcium: 8.7 mg/dL — ABNORMAL LOW (ref 8.9–10.3)
Calcium: 9.4 mg/dL (ref 8.9–10.3)
Chloride: 93 mmol/L — ABNORMAL LOW (ref 98–111)
Chloride: 94 mmol/L — ABNORMAL LOW (ref 98–111)
Creatinine, Ser: 6.29 mg/dL — ABNORMAL HIGH (ref 0.44–1.00)
Creatinine, Ser: 3.77 mg/dL — ABNORMAL HIGH (ref 0.44–1.00)
GFR, Estimated: 7 mL/min — ABNORMAL LOW (ref >60)
GFR, Estimated: 12 mL/min — ABNORMAL LOW (ref >60)
Glucose, Bld: 90 mg/dL (ref 70–99)
Glucose, Bld: 115 mg/dL — ABNORMAL HIGH (ref 70–99)
Potassium: 4.4 mmol/L (ref 3.5–5.1)
Potassium: 3.8 mmol/L (ref 3.5–5.1)
Sodium: 135 mmol/L (ref 135–145)
Sodium: 135 mmol/L (ref 135–145)

## 2023-04-03 LAB — HEPATITIS B SURFACE ANTIBODY, QUANTITATIVE: Hep B S AB Quant (Post): 47.1 m[IU]/mL

## 2023-04-03 LAB — GLUCOSE, CAPILLARY: Glucose-Capillary: 114 mg/dL — ABNORMAL HIGH (ref 70–99)

## 2023-04-03 LAB — MAGNESIUM: Magnesium: 1.9 mg/dL (ref 1.7–2.4)

## 2023-04-03 MED ORDER — PENTAFLUOROPROP-TETRAFLUOROETH EX AERO: 1.0000 | INHALATION_SPRAY | CUTANEOUS | Status: DC | PRN

## 2023-04-03 MED ORDER — NEPRO/CARBSTEADY PO LIQD: 237.0000 mL | ORAL | Status: DC | PRN

## 2023-04-03 MED ORDER — ALTEPLASE 2 MG IJ SOLR: 2.0000 mg | Freq: Once | INTRAMUSCULAR | Status: DC | PRN

## 2023-04-03 MED ORDER — HEPARIN SODIUM (PORCINE) 1000 UNIT/ML DIALYSIS
1000.0000 [IU] | INTRAMUSCULAR | Status: DC | PRN
  Administered 2023-04-03: 3200 [IU]
  Filled 2023-04-03 (×2): qty 1

## 2023-04-03 MED ORDER — ANTICOAGULANT SODIUM CITRATE 4% (200MG/5ML) IV SOLN: 5.0000 mL | Status: DC | PRN

## 2023-04-03 MED ORDER — LIDOCAINE HCL (PF) 1 % IJ SOLN: 5.0000 mL | INTRAMUSCULAR | Status: DC | PRN

## 2023-04-03 MED ORDER — LIDOCAINE-PRILOCAINE 2.5-2.5 % EX CREA: 1.0000 | TOPICAL_CREAM | CUTANEOUS | Status: DC | PRN

## 2023-04-03 NOTE — Progress Notes (Addendum)
OT Cancellation Note  Patient Details Name: Monique Henderson MRN: [id number] DOB: 05-08-1951   Cancelled Treatment:    Reason Eval/Treat Not Completed: Patient declined, no reason specified (pt declines 2/2 nausea, requests therapy return later, will follow up for OT evaluation this PM as schedule permits)  1404: Reattempted, pt off unit for HD. Will follow up next date as schedule permits   Carver Fila, OTD, OTR/L SecureChat Preferred Acute Rehab [phone]   Dalphine Handing 04/03/2023, 10:05 AM

## 2023-04-03 NOTE — Progress Notes (Signed)
[facility] Progress Note  Subjective: 3.1 L UF w/ HD last night overnight. BP's stable.   Vitals:   04/03/23 0604 04/03/23 0815 04/03/23 0830 04/03/23 0950  BP: (!) 175/93 (!) 177/102 (!) 156/84   Pulse: 76  77 77  Resp: 20 20    Temp: 99.2 F (37.3 C) 98.5 F (36.9 C)    TempSrc: Oral Oral    SpO2: 100%  100% 100%  Weight: 44.6 kg     Height:        Exam: Gen alert and not in distress, on RA No jvd or bruits Chest cta bilat, no wheezing RRR no RG Abd soft ntnd no mass or ascites +bs Ext no LE edema Neuro is alert, Ox 3 , nf    RIJ TDC intact          Renal-related home meds: - oxy IR prn - norvasc 10 - eliquis 2.5 bid - coreg 25 bid - clonidine 0.1 bid - hydralazine 100 tid - imdur 30-60 - percocet prn - renvela 3 ac tid        OP HD: SW TTS  3.5h   400/1.5   45.5kg  2/2 bath  TDC   Heparin none - last OP HD 10/29, post wt 48 - has been coming off 2-3kg over the last 2 weeks - sensipar 60mg  - hectorol 9 mcg IV - venofer 50mg  weekly - mircera 100 mcg q 2 wks, last 10/29, due 11/13  - last Hb 8.9 on 10/24     Assessment/ Plan: Acute hypoxic resp failure - due to vol overload in esrd pt. CXR +congestion and early edema. Rising wt's at OP HD for last 2 wks. Only gets 2 L removed due to cramping, and gains more than that. Required bipap initially in ED then 3L [place]. Got HD upstairs overnight w/ 3.1 L UF. Weaned down to RA today. Under dry wt post HD.  ESKD - on HD TTS.  Has not missed OP HD. Had HD here overnight. Short HD today x 2 hr, lower dry wt further. Then likely can be dc'd afterwards HD.  HTN/ BP - on multiple BP meds at home. BP was 220/120 in Ed originally. She rec'd po norvasc, coreg, clonidine, hydralazine, imdur and IV ntg then topical NTP. BP's better.  Volume - is now 1kg under dry wt after 3.1 L off last night. UF 2 L today and lower dry wt to post HD wt today.  Anemia of eskd - Hb 8-10 here. Just got esa at OP unit, next due is  11/13.  MBD ckd - Ca in range. Cont renvela as binder, sensipar and IV vdra.  PAF - on eliquis, BB    Rob Ovila Lepage MD  [facility] 04/03/2023, 10:00 AM  Recent Labs  Lab 04/02/23 0556 04/02/23 1343 04/03/23 0658  HGB 8.4*  --  8.0*  ALBUMIN  --  3.0*  --   CALCIUM 8.9  --  8.7*  PHOS  --  6.6*  --   CREATININE 10.52*  --  6.29*  K 4.8  --  4.4   No results for input(s): "IRON", "TIBC", "FERRITIN" in the last 168 hours. Inpatient medications:  amLODipine  10 mg Oral Daily   apixaban  2.5 mg Oral BID   carvedilol  25 mg Oral BID WC   cinacalcet  60 mg Oral Q T,Th,Sat-1800   cloNIDine  0.1 mg Oral BID   doxercalciferol  4 mcg Intravenous Q T,Th,Sat-1800  hydrALAZINE  100 mg Oral Q8H   isosorbide mononitrate  30 mg Oral Once per day on Tuesday Thursday Saturday   And   isosorbide mononitrate  60 mg Oral Once per day on Sunday Monday Wednesday Friday   pantoprazole  40 mg Oral Daily   rosuvastatin  10 mg Oral Daily   sevelamer carbonate  2.4 g Oral TID WC   sodium chloride flush  3 mL Intravenous Q12H    fentaNYL (SUBLIMAZE) injection, oxyCODONE-acetaminophen **AND** oxyCODONE, senna, traZODone

## 2023-04-03 NOTE — Progress Notes (Signed)
Received patient in bed to unit.  Alert and oriented.  Informed consent signed and in chart.   TX duration:2  Patient tolerated well.  Transported back to the room  Alert, without acute distress.  Hand-off given to patient's nurse.   Access used: right hd catheter Access issues: none ran reversed  Total UF removed: 2L Medication(s) given: hectorol   04/03/23 1622  Vitals  Temp 98.6 F (37 C)  Temp Source Oral  BP (!) 136/90  MAP (mmHg) 104  BP Location Right Arm  BP Method Automatic  Patient Position (if appropriate) Lying  Pulse Rate 70  Pulse Rate Source Monitor  ECG Heart Rate 72  Resp 17  Oxygen Therapy  SpO2 100 %  O2 Device Room Air  During Treatment Monitoring  HD Safety Checks Performed Yes  Intra-Hemodialysis Comments Tx completed;Tolerated well  Dialysis Fluid Bolus Normal Saline  Bolus Amount (mL) 300 mL      Robertine Kipper S [facility]

## 2023-04-03 NOTE — Care Management CC44 (Signed)
Condition Code 44 Documentation Completed  Patient Details  Name: JENALYN GIRDNER MRN: [id number] Date of Birth: 1950/12/26   Condition Code 44 given:  Yes Patient signature on Condition Code 44 notice:  Yes Documentation of 2 MD's agreement:  Yes Code 44 added to claim:  Yes    Lawerance Sabal, RN 04/03/2023, 12:14 PM

## 2023-04-03 NOTE — Progress Notes (Signed)
Okay to discharge patient at this time, per Dr. Nelson Chimes.

## 2023-04-03 NOTE — Hospital Course (Addendum)
  Brief Narrative:  72 year old with history of HTN, PAF on Eliquis, chronic CHF, COPD, ESRD on HD admitted for shortness of breath.  Apparently patient did not go to her outpatient dialysis.  Chest x-ray showed volume overload.  Nephrology was consulted, BiPAP was initiated and placed on nitroglycerin drip.  Patient underwent HD on 11/1.  Eventually weaned off BiPAP and nitroglycerin drip.  Slowly her condition improved with hemodialysis.  Will discharge her today after her second HD session.  She can resume her outpatient HD per nephrology.     Assessment & Plan:  Principal Problem:   Acute respiratory failure with hypoxia (HCC) Active Problems:   Hypertensive emergency   Paroxysmal atrial fibrillation (HCC)   Chronic low back pain   Chronic obstructive lung disease (HCC)   History of CVA (cerebrovascular accident)   ESRD (end stage renal disease) (HCC)   Hypervolemia associated with renal insufficiency   Pulmonary edema, acute (HCC)   Acute pulmonary edema Acute respiratory failure requiring BiPAP - Patient's breathing is now stable off BiPAP on room air.  Doing well.   ESRD on HD - Apparently missed her last session due to shortness of breath.  Nephro was consulted and patient underwent HD on 11/1.  Planning another 2-hour session today thereafter we will discharge her   Congestive heart failure; ef 45% -Current medications coreg, Imdur, clonidine.   History of essential hypertension - On clonidine, Coreg, amlodipine   Paroxysmal A-fib - Continue Eliquis.  Coreg.   History of stroke - Eliquis and Crestor   Chronic back pain - Pain control.  Bowel regimen as needed   Patient's condition improved much rapidly with hemodialysis than expected.   DVT prophylaxis: Eliquis Code Status: Full Family Communication:   Status is: Inpatient DC today after hemodialysis   Subjective: Feeling well no complaints    Examination:   General exam: Appears calm and comfortable   Respiratory system: Clear to auscultation. Respiratory effort normal. Cardiovascular system: S1 & S2 heard, RRR. No JVD, murmurs, rubs, gallops or clicks. No pedal edema. Gastrointestinal system: Abdomen is nondistended, soft and nontender. No organomegaly or masses felt. Normal bowel sounds heard. Central nervous system: Alert and oriented. No focal neurological deficits. Extremities: Symmetric 5 x 5 power. Skin: No rashes, lesions or ulcers Psychiatry: Judgement and insight appear normal. Mood & affect appropriate.

## 2023-04-03 NOTE — Care Management Obs Status (Signed)
MEDICARE OBSERVATION STATUS NOTIFICATION   Patient Details  Name: Monique Henderson MRN: [id number] Date of Birth: Aug 04, 1950   Medicare Observation Status Notification Given:  Yes    Lawerance Sabal, RN 04/03/2023, 12:14 PM

## 2023-04-03 NOTE — Progress Notes (Signed)
PT Cancellation Note  Patient Details Name: Monique Henderson MRN: [id number] DOB: [birth year]   Cancelled Treatment:    Reason Eval/Treat Not Completed: Patient at procedure or test/unavailable. Pt at HD, will continue to follow as able.  Hilton Cork, PT, DPT Secure Chat Preferred  Rehab Office [phone]   Arturo Morton Brion Aliment 04/03/2023, 2:16 PM

## 2023-04-03 NOTE — Discharge Summary (Signed)
Physician Discharge Summary  Monique Henderson RUE:[id number] DOB: 21-Jan-1951 DOA: 04/02/2023  PCP: Fleet Contras, MD  Admit date: 04/02/2023 Discharge date: 04/03/2023  Admitted From: Home Disposition: Home Home  Recommendations for Outpatient Follow-up:  Follow up with PCP in 1-2 weeks Please obtain BMP/CBC in one week your next doctors visit.  Advised to remain compliant with her medication.  Resume outpatient hemodialysis   Discharge Condition: Stable CODE STATUS: Full code Diet recommendation: Heart healthy    Brief Narrative:  72 year old with history of HTN, PAF on Eliquis, chronic CHF, COPD, ESRD on HD admitted for shortness of breath.  Apparently patient did not go to her outpatient dialysis.  Chest x-ray showed volume overload.  Nephrology was consulted, BiPAP was initiated and placed on nitroglycerin drip.  Patient underwent HD on 11/1.  Eventually weaned off BiPAP and nitroglycerin drip.  Slowly her condition improved with hemodialysis.  Will discharge her today after her second HD session.  She can resume her outpatient HD per nephrology.     Assessment & Plan:  Principal Problem:   Acute respiratory failure with hypoxia (HCC) Active Problems:   Hypertensive emergency   Paroxysmal atrial fibrillation (HCC)   Chronic low back pain   Chronic obstructive lung disease (HCC)   History of CVA (cerebrovascular accident)   ESRD (end stage renal disease) (HCC)   Hypervolemia associated with renal insufficiency   Pulmonary edema, acute (HCC)   Acute pulmonary edema Acute respiratory failure requiring BiPAP - Patient's breathing is now stable off BiPAP on room air.  Doing well.   ESRD on HD - Apparently missed her last session due to shortness of breath.  Nephro was consulted and patient underwent HD on 11/1.  Planning another 2-hour session today thereafter we will discharge her   Congestive heart failure; ef 45% -Current medications coreg, Imdur, clonidine.   History  of essential hypertension - On clonidine, Coreg, amlodipine   Paroxysmal A-fib - Continue Eliquis.  Coreg.   History of stroke - Eliquis and Crestor   Chronic back pain - Pain control.  Bowel regimen as needed   Patient's condition improved much rapidly with hemodialysis than expected.   DVT prophylaxis: Eliquis Code Status: Full Family Communication:   Status is: Inpatient DC today after hemodialysis   Subjective: Feeling well no complaints    Examination:   General exam: Appears calm and comfortable  Respiratory system: Clear to auscultation. Respiratory effort normal. Cardiovascular system: S1 & S2 heard, RRR. No JVD, murmurs, rubs, gallops or clicks. No pedal edema. Gastrointestinal system: Abdomen is nondistended, soft and nontender. No organomegaly or masses felt. Normal bowel sounds heard. Central nervous system: Alert and oriented. No focal neurological deficits. Extremities: Symmetric 5 x 5 power. Skin: No rashes, lesions or ulcers Psychiatry: Judgement and insight appear normal. Mood & affect appropriate.     Discharge Diagnoses:  Principal Problem:   Acute respiratory failure with hypoxia [facility]) Active Problems:   Hypertensive emergency   Paroxysmal atrial fibrillation (HCC)   Chronic low back pain   Chronic obstructive lung disease (HCC)   History of CVA (cerebrovascular accident)   ESRD (end stage renal disease) (HCC)   Hypervolemia associated with renal insufficiency   Pulmonary edema, acute (HCC)   Discharge Exam: Vitals:   04/03/23 0830 04/03/23 0950  BP: (!) 156/84   Pulse: 77 77  Resp:    Temp:    SpO2: 100% 100%   Vitals:   04/03/23 0604 04/03/23 0815 04/03/23 0830 04/03/23 0950  BP: Marland Kitchen)  175/93 (!) 177/102 (!) 156/84   Pulse: 76  77 77  Resp: 20 20    Temp: 99.2 F (37.3 C) 98.5 F (36.9 C)    TempSrc: Oral Oral    SpO2: 100%  100% 100%  Weight: 44.6 kg     Height:        Discharge Instructions   Allergies as of 04/03/2023        Reactions   Shrimp [shellfish Allergy] Shortness Of Breath   Bactroban [mupirocin] Other (See Comments)   "Sores in nose"   Hydrocodone Itching, Nausea Only, Nausea And Vomiting   Chlorhexidine Gluconate Itching   Tylenol [acetaminophen] Itching   Zestril [lisinopril] Cough        Medication List     TAKE these medications    albuterol 108 (90 Base) MCG/ACT inhaler Commonly known as: VENTOLIN HFA Inhale 2 puffs into the lungs every 6 (six) hours as needed for wheezing or shortness of breath.   amLODipine 10 MG tablet Commonly known as: NORVASC Take 1 tablet (10 mg total) by mouth daily.   apixaban 2.5 MG Tabs tablet Commonly known as: ELIQUIS Take 1 tablet (2.5 mg total) by mouth 2 (two) times daily.   carvedilol 25 MG tablet Commonly known as: COREG Take 1 tablet (25 mg total) by mouth 2 (two) times daily with a meal.   cloNIDine 0.1 MG tablet Commonly known as: Catapres Take 1 tablet (0.1 mg total) by mouth 2 (two) times daily.   hydrALAZINE 100 MG tablet Commonly known as: APRESOLINE Take 1 tablet (100 mg total) by mouth every 8 (eight) hours.   isosorbide mononitrate 30 MG 24 hr tablet Commonly known as: IMDUR TAKE 1 TABLET (30MG ) ON DIALYSIS DAYS AND 2 TABLETS (60 MG) ON NON DIALYSIS DAYS.   naloxone 4 MG/0.1ML Liqd nasal spray kit Commonly known as: NARCAN 1 spray as directed.   Oxycodone HCl 10 MG Tabs Take 10 mg by mouth 3 (three) times daily as needed (For more moderate pain).   oxyCODONE-acetaminophen 10-325 MG tablet Commonly known as: PERCOCET Take 1 tablet by mouth 3 (three) times daily as needed for pain.   pantoprazole 40 MG tablet Commonly known as: PROTONIX TAKE 1 TABLET (40 MG TOTAL) BY MOUTH 2 (TWO) TIMES DAILY. What changed: when to take this   rosuvastatin 10 MG tablet Commonly known as: CRESTOR TAKE 1 TABLET (10 MG TOTAL) BY MOUTH DAILY FOR CHOLESTEROL What changed: See the new instructions.   senna-docusate 8.6-50 MG  tablet Commonly known as: Senokot-S Take 1 tablet by mouth 2 (two) times daily between meals as needed for mild constipation.   sevelamer carbonate 2.4 g Pack Commonly known as: RENVELA Take 2.4 g by mouth 3 (three) times daily with meals.   traZODone 50 MG tablet Commonly known as: DESYREL Take 25-50 mg by mouth at bedtime.   VITAMIN D-3 PO Take 1 capsule by mouth daily.        Allergies  Allergen Reactions   Shrimp [Shellfish Allergy] Shortness Of Breath   Bactroban [Mupirocin] Other (See Comments)    "Sores in nose"   Hydrocodone Itching, Nausea Only and Nausea And Vomiting   Chlorhexidine Gluconate Itching   Tylenol [Acetaminophen] Itching   Zestril [Lisinopril] Cough    You were cared for by a hospitalist during your hospital stay. If you have any questions about your discharge medications or the care you received while you were in the hospital after you are discharged, you can call the unit  and asked to speak with the hospitalist on call if the hospitalist that took care of you is not available. Once you are discharged, your primary care physician will handle any further medical issues. Please note that no refills for any discharge medications will be authorized once you are discharged, as it is imperative that you return to your primary care physician (or establish a relationship with a primary care physician if you do not have one) for your aftercare needs so that they can reassess your need for medications and monitor your lab values.  You were cared for by a hospitalist during your hospital stay. If you have any questions about your discharge medications or the care you received while you were in the hospital after you are discharged, you can call the unit and asked to speak with the hospitalist on call if the hospitalist that took care of you is not available. Once you are discharged, your primary care physician will handle any further medical issues. Please note that NO  REFILLS for any discharge medications will be authorized once you are discharged, as it is imperative that you return to your primary care physician (or establish a relationship with a primary care physician if you do not have one) for your aftercare needs so that they can reassess your need for medications and monitor your lab values.  Please request your Prim.MD to go over all Hospital Tests and Procedure/Radiological results at the follow up, please get all Hospital records sent to your Prim MD by signing hospital release before you go home.  Get CBC, CMP, 2 view Chest X ray checked  by Primary MD during your next visit or SNF MD in 5-7 days ( we routinely change or add medications that can affect your baseline labs and fluid status, therefore we recommend that you get the mentioned basic workup next visit with your PCP, your PCP may decide not to get them or add new tests based on their clinical decision)  On your next visit with your primary care physician please Get Medicines reviewed and adjusted.  If you experience worsening of your admission symptoms, develop shortness of breath, life threatening emergency, suicidal or homicidal thoughts you must seek medical attention immediately by calling 911 or calling your MD immediately  if symptoms less severe.  You Must read complete instructions/literature along with all the possible adverse reactions/side effects for all the Medicines you take and that have been prescribed to you. Take any new Medicines after you have completely understood and accpet all the possible adverse reactions/side effects.   Do not drive, operate heavy machinery, perform activities at heights, swimming or participation in water activities or provide baby sitting services if your were admitted for syncope or siezures until you have seen by Primary MD or a Neurologist and advised to do so again.  Do not drive when taking Pain medications.   Procedures/Studies: DG Chest 2  View  Result Date: 04/02/2023 CLINICAL DATA:  Shortness of breath EXAM: CHEST - 2 VIEW COMPARISON:  01/27/2023 FINDINGS: Post sternotomy changes. Right-sided central venous catheter with tips at the right atrium. Cardiomegaly with vascular congestion and pulmonary edema, similar compared to prior exam. More confluent airspace disease at the bases could reflect edema or pneumonia. Small pleural effusions. Aortic atherosclerosis. No pneumothorax. IMPRESSION: Cardiomegaly with vascular congestion and pulmonary edema. More confluent airspace disease at the bases could reflect edema or pneumonia. Small pleural effusions. Findings do not appear significantly changed compared to prior. Electronically Signed  By: Jasmine Pang M.D.   On: 04/02/2023 03:25     The results of significant diagnostics from this hospitalization (including imaging, microbiology, ancillary and laboratory) are listed below for reference.     Microbiology: No results found for this or any previous visit (from the past 240 hour(s)).   Labs: BNP (last 3 results) Recent Labs    01/14/23 1545 01/27/23 2306 04/02/23 0208  BNP >4,500.0* >4,500.0* >4,500.0*   Basic Metabolic Panel: Recent Labs  Lab 04/02/23 0208 04/02/23 0556 04/02/23 1343 04/03/23 0658  NA 139 138  --  135  K 4.7 4.8  --  4.4  CL 99 97*  --  93*  CO2 23 25  --  25  GLUCOSE 102* 97  --  90  BUN 48* 52*  --  23  CREATININE 9.87* 10.52*  --  6.29*  CALCIUM 9.0 8.9  --  8.7*  PHOS  --   --  6.6*  --    Liver Function Tests: Recent Labs  Lab 04/02/23 1343  ALBUMIN 3.0*   No results for input(s): "LIPASE", "AMYLASE" in the last 168 hours. No results for input(s): "AMMONIA" in the last 168 hours. CBC: Recent Labs  Lab 04/02/23 0208 04/02/23 0556 04/03/23 0658  WBC 7.3 6.9 4.2  NEUTROABS 5.5  --   --   HGB 9.1* 8.4* 8.0*  HCT 29.3* 27.3* 26.1*  MCV 97.0 96.8 97.0  PLT 215 209 224   Cardiac Enzymes: No results for input(s): "CKTOTAL",  "CKMB", "CKMBINDEX", "TROPONINI" in the last 168 hours. BNP: Invalid input(s): "POCBNP" CBG: No results for input(s): "GLUCAP" in the last 168 hours. D-Dimer No results for input(s): "DDIMER" in the last 72 hours. Hgb A1c No results for input(s): "HGBA1C" in the last 72 hours. Lipid Profile No results for input(s): "CHOL", "HDL", "LDLCALC", "TRIG", "CHOLHDL", "LDLDIRECT" in the last 72 hours. Thyroid function studies No results for input(s): "TSH", "T4TOTAL", "T3FREE", "THYROIDAB" in the last 72 hours.  Invalid input(s): "FREET3" Anemia work up No results for input(s): "VITAMINB12", "FOLATE", "FERRITIN", "TIBC", "IRON", "RETICCTPCT" in the last 72 hours. Urinalysis    Component Value Date/Time   COLORURINE AMBER (A) 12/27/2021 1840   APPEARANCEUR TURBID (A) 12/27/2021 1840   LABSPEC 1.013 12/27/2021 1840   PHURINE 8.0 12/27/2021 1840   GLUCOSEU NEGATIVE 12/27/2021 1840   GLUCOSEU NEG mg/dL 10/62/6948 5462   HGBUR MODERATE (A) 12/27/2021 1840   BILIRUBINUR NEGATIVE 12/27/2021 1840   BILIRUBINUR negative 05/04/2018 0910   KETONESUR NEGATIVE 12/27/2021 1840   PROTEINUR >=300 (A) 12/27/2021 1840   UROBILINOGEN 0.2 05/04/2018 0910   UROBILINOGEN 1 02/14/2014 0946   NITRITE NEGATIVE 12/27/2021 1840   LEUKOCYTESUR LARGE (A) 12/27/2021 1840   Sepsis Labs Recent Labs  Lab 04/02/23 0208 04/02/23 0556 04/03/23 0658  WBC 7.3 6.9 4.2   Microbiology No results found for this or any previous visit (from the past 240 hour(s)).   Time coordinating discharge:  I have spent 35 minutes face to face with the patient and on the ward discussing the patients care, assessment, plan and disposition with other care givers. >50% of the time was devoted counseling the patient about the risks and benefits of treatment/Discharge disposition and coordinating care.   SIGNED:   Miguel Rota, MD  [facility] 04/03/2023, 10:45 AM   If 7PM-7AM, please contact night-coverage

## 2023-04-04 NOTE — Discharge Planning (Deleted)
[facility] Patient Discharge Orders- [facility] CLINIC: [facility]  Patient's name: Monique Henderson Admit/DC Dates: 04/02/2023 - 04/03/2023  Discharge Diagnoses: Acute Hypoxic Respiratory failure     Aranesp: Given: NA   Date and amount of last dose: NA  Last Hgb: 8.0 PRBC's Given: No Date/# of units: NA ESA dose for discharge: mircera 150 mcg IV q 2 weeks  IV Iron dose at discharge: Per protocol  Heparin change: NA  EDW Change: No New EDW: NA  Bath Change: NA  Access intervention/Change: NA Details:  Hectorol/Calcitriol change: NA  Discharge Labs: Calcium 8.7 Phosphorus 6.6 Albumin 3.0 K+ 4.4  IV Antibiotics: NA Details:  On Coumadin?: NA Last INR: Next INR: Managed By:   OTHER/APPTS/LAB ORDERS:    D/C Meds to be reconciled by nurse after every discharge.  Completed By: Alonna Buckler [facility] [phone]    Reviewed by: MD:______ RN_______

## 2023-04-04 NOTE — Discharge Planning (Signed)
[facility] Patient Discharge Orders- [facility] CLINIC: [facility]  Patient's name: Monique Henderson Admit/DC Dates: 04/02/2023 - 04/03/2023  Discharge Diagnoses: Acute hypoxic respiratory failure     Aranesp: Given: No   Date and amount of last dose: NA  Last Hgb: 8.0 PRBC's Given: No Date/# of units: NA ESA dose for discharge: mircera 150 mcg IV q 2 weeks  IV Iron dose at discharge: Per protocol  Heparin change: No  EDW Change: No New EDW: NA  Bath Change: No  Access intervention/Change: NO Details:  Hectorol/Calcitriol change: NO  Discharge Labs: Calcium 9.4 Phosphorus 6.6 Albumin 3.0 K+ 3.8  IV Antibiotics: NO Details:  On Coumadin?: No Last INR: Next INR: Managed By:   OTHER/APPTS/LAB ORDERS:    D/C Meds to be reconciled by nurse after every discharge.  Completed By: Alonna Buckler [facility] [phone]  Reviewed by: MD:______ RN_______

## 2023-04-05 NOTE — Progress Notes (Signed)
Late Note Entry- Nov. 4, 2024  Pt was d/c on Saturday. Contacted [facility] this morning to advise clinic of pt's d/c date and that pt should resume care tomorrow.   Olivia Canter Renal Navigator [phone]

## 2023-04-21 ENCOUNTER — Encounter (HOSPITAL_COMMUNITY): Payer: Self-pay | Admitting: Physician Assistant

## 2023-04-21 ENCOUNTER — Ambulatory Visit (HOSPITAL_COMMUNITY)
Admission: RE | Admit: 2023-04-21 | Discharge: 2023-04-21 | Disposition: A | Discharge status: Home / Self Care | Payer: Medicare Other | Source: Ambulatory Visit | Attending: Physician Assistant | Admitting: Physician Assistant

## 2023-04-21 VITALS — BP 128/84 | HR 66 | Ht 63.0 in | Wt 108.2 lb

## 2023-04-21 DIAGNOSIS — I132 Hypertensive heart and chronic kidney disease with heart failure and with stage 5 chronic kidney disease, or end stage renal disease: Secondary | ICD-10-CM | POA: Insufficient documentation

## 2023-04-21 DIAGNOSIS — D6869 Other thrombophilia: Secondary | ICD-10-CM | POA: Insufficient documentation

## 2023-04-21 DIAGNOSIS — I700 Atherosclerosis of aorta: Secondary | ICD-10-CM | POA: Diagnosis not present

## 2023-04-21 DIAGNOSIS — Z853 Personal history of malignant neoplasm of breast: Secondary | ICD-10-CM | POA: Insufficient documentation

## 2023-04-21 DIAGNOSIS — I5022 Chronic systolic (congestive) heart failure: Secondary | ICD-10-CM | POA: Insufficient documentation

## 2023-04-21 DIAGNOSIS — Z7901 Long term (current) use of anticoagulants: Secondary | ICD-10-CM | POA: Diagnosis not present

## 2023-04-21 DIAGNOSIS — I4892 Unspecified atrial flutter: Secondary | ICD-10-CM | POA: Diagnosis not present

## 2023-04-21 DIAGNOSIS — Z8673 Personal history of transient ischemic attack (TIA), and cerebral infarction without residual deficits: Secondary | ICD-10-CM | POA: Diagnosis not present

## 2023-04-21 DIAGNOSIS — I4510 Unspecified right bundle-branch block: Secondary | ICD-10-CM | POA: Insufficient documentation

## 2023-04-21 DIAGNOSIS — N186 End stage renal disease: Secondary | ICD-10-CM | POA: Insufficient documentation

## 2023-04-21 DIAGNOSIS — I480 Paroxysmal atrial fibrillation: Secondary | ICD-10-CM | POA: Insufficient documentation

## 2023-04-21 DIAGNOSIS — Z992 Dependence on renal dialysis: Secondary | ICD-10-CM | POA: Diagnosis not present

## 2023-04-21 NOTE — Progress Notes (Signed)
Primary Care Physician: Fleet Contras, MD Primary Cardiologist: previously Dr Katrinka Blazing and Dr Eldridge Dace Primary Electrophysiologist: none Referring Physician: [facility]   Monique Henderson is a 72 y.o. 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  The patient was initially diagnosed with atrial fibrillation remotely. Patient is on Eliquis for a CHADS2VASC score of 7.  She was seen at the ED 10/10/22 with chest discomfort and SOB. She was found to be in afib with rapid rates and underwent DCCV.  Patient has had several admissions since her last visit. All appear to be related to hypertensive emergencies, fluid overload, and respiratory failure due to missed sessions of HD.  On follow up today, patient reports that she feels well today. She states that she has not had any tachypalpitations outside of her hospital visits. She is in SR today. Fluid status appears stable. No bleeding issues on anticoagulation.   Today, she denies symptoms of palpitations, chest pain, shortness of breath, orthopnea, PND, lower extremity edema, dizziness, presyncope, syncope, snoring, daytime somnolence, bleeding, or neurologic sequela. The patient is tolerating medications without difficulties and is otherwise without complaint today.    Atrial Fibrillation Risk Factors:  she does not have symptoms or diagnosis of sleep apnea. she does not have a history of rheumatic fever.   Atrial Fibrillation Management history:  Previous antiarrhythmic drugs: none Previous cardioversions: 10/10/22 Previous ablations: none Anticoagulation history: warfarin, Eliquis   Past  Medical History:  Diagnosis Date   AF (paroxysmal atrial fibrillation) (HCC) 05/29/2019   on Coumadin   Aortic atherosclerosis (HCC) 07/05/2019   Aortic dissection (HCC) 04/04/2019   s/p repair   Bone spur 2008   Right calcaneal foot spur   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 Chevis Pretty; S/P re-excision of superior and inferior margins 10/27/2012.    ESRD on hemodialysis (HCC) 05/29/2019   Essential hypertension 09/16/2006   GERD 09/16/2006   Hepatitis C    treated and RNA confirmed not detectable 01/2017   Insomnia 03/14/2015   Malnutrition of moderate degree 05/19/2019   Non compliance with medical treatment 12/04/2017   Normocytic anemia    With thrombocytosis   Osteoarthritis    Right ureteral stone 2002   S/P lumbar spinal fusion 01/18/2014   S/P lumbar decompressive laminectomy, fusion, and plating for lumbar spinal stenosis on 05/27/2009 by Dr. Tia Alert.  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 Tia Alert.     Stroke (cerebrum) (HCC)    Tobacco use disorder 04/19/2009   Uterine fibroid    Wears dentures  top    Current Outpatient Medications  Medication Sig Dispense Refill   albuterol (VENTOLIN HFA) 108 (90 Base) MCG/ACT inhaler Inhale 2 puffs into the lungs every 6 (six) hours as  needed for wheezing or shortness of breath. 8.5 g 0   amLODipine (NORVASC) 10 MG tablet Take 1 tablet (10 mg total) by mouth daily. 30 tablet 0   apixaban (ELIQUIS) 2.5 MG TABS tablet Take 1 tablet (2.5 mg total) by mouth 2 (two) times daily. 60 tablet 0   carvedilol (COREG) 25 MG tablet Take 1 tablet (25 mg total) by mouth 2 (two) times daily with a meal. 60 tablet 1   Cholecalciferol (VITAMIN D-3 PO) Take 1 capsule by mouth daily.     cloNIDine (CATAPRES) 0.1 MG tablet Take 1 tablet (0.1 mg total) by mouth 2 (two) times daily. 60 tablet 11   hydrALAZINE (APRESOLINE) 100 MG tablet Take 1 tablet (100 mg total) by mouth every 8 (eight) hours. 270 tablet 3   isosorbide mononitrate (IMDUR) 30 MG 24 hr tablet TAKE 1 TABLET (30MG ) ON DIALYSIS DAYS AND 2 TABLETS (60 MG) ON NON DIALYSIS DAYS. 45 tablet 10   naloxone (NARCAN) nasal spray 4 mg/0.1 mL 1 spray as directed.     Oxycodone HCl 10 MG TABS Take 10 mg by mouth 3 (three) times daily as needed (For more moderate pain).     oxyCODONE-acetaminophen (PERCOCET) 10-325 MG tablet Take 1 tablet by mouth 3 (three) times daily as needed for pain.     pantoprazole (PROTONIX) 40 MG tablet TAKE 1 TABLET (40 MG TOTAL) BY MOUTH 2 (TWO) TIMES DAILY. (Patient taking differently: Take 40 mg by mouth daily.) 60 tablet 1   rosuvastatin (CRESTOR) 10 MG tablet TAKE 1 TABLET (10 MG TOTAL) BY MOUTH DAILY FOR CHOLESTEROL (Patient taking differently: Take 10 mg by mouth daily.) 90 tablet 0   senna-docusate (SENOKOT-S) 8.6-50 MG tablet Take 1 tablet by mouth 2 (two) times daily between meals as needed for mild constipation. 180 tablet 0   sevelamer carbonate (RENVELA) 2.4 g PACK Take 2.4 g by mouth 3 (three) times daily with meals. 90 each 0   traZODone (DESYREL) 50 MG tablet Take 25-50 mg by mouth at bedtime.     No current facility-administered medications for this encounter.    ROS- All systems are reviewed and negative except as per the HPI above.  Physical  Exam: Vitals:   04/21/23 1020  BP: 128/84  Pulse: 66  Weight: 49.1 kg  Height: 5\' 3"  (1.6 m)     GEN: Well nourished, well developed in no acute distress NECK: No JVD; No carotid bruits CARDIAC: Regular rate and rhythm, no murmurs, rubs, gallops RESPIRATORY:  Clear to auscultation without rales, wheezing or rhonchi  ABDOMEN: Soft, non-tender, non-distended EXTREMITIES:  No edema; No deformity  NEURO: slurred speech at basline, decreased ROM right upper extremity    Wt Readings from Last 3 Encounters:  04/21/23 49.1 kg  04/03/23 43.6 kg  01/30/23 50.8 kg    EKG today demonstrates  SR, RBBB Vent. rate 66 BPM PR interval 176 ms QRS duration 134 ms QT/QTcB 498/522 ms   Echo 06/19/22 demonstrated  1. Distal septal hypokinesis consider bringing patient back for  TDI/strain imaging Consider further w/u for amyloid. Left ventricular  ejection fraction, by estimation, is 45 to 50%. The left ventricle has  mildly decreased function. The left ventricle has no regional wall motion abnormalities. There is severe left ventricular hypertrophy. Left ventricular diastolic parameters  are consistent with Grade II diastolic dysfunction (pseudonormalization). Elevated left ventricular end-diastolic pressure.   2. Right ventricular systolic function is normal. The right ventricular  size is normal.   3. Left atrial size was mildly dilated.   4. The mitral valve is abnormal. Mild mitral valve regurgitation. No  evidence of mitral stenosis.   5. Tricuspid valve regurgitation is moderate.   6. The aortic valve is tricuspid. There is moderate thickening of the  aortic valve. Aortic valve regurgitation is not visualized. No aortic  stenosis is present.   7. The inferior vena cava is normal in size with greater than 50%  respiratory variability, suggesting right atrial pressure of 3 mmHg.    Epic records are reviewed at length today.  CHA2DS2-VASc Score = 7  The patient's score is based  upon: CHF History: 1 HTN History: 1 Diabetes History: 0 Stroke History: 2 Vascular Disease History: 1 Age Score: 1 Gender Score: 1       ASSESSMENT AND PLAN: Paroxysmal Atrial Fibrillation/atrial flutter The patient's CHA2DS2-VASc score is 7, indicating a 11.2% annual risk of stroke.   Patient in SR. Episodes appear 2/2 to her other medical conditions.  With ESRD, her only AAD option would be amiodarone if further rhythm control is needed.  Continue Eliquis 2.5 mg BID (weight <60kg, Cr >1.5) Continue carvedilol 25 mg BID  Secondary Hypercoagulable State (ICD10:  D68.69) The patient is at significant risk for stroke/thromboembolism based upon her CHA2DS2-VASc Score of 7.  Continue Apixaban (Eliquis).   ESRD On HD Tu/Th/Sa  Chronic HFrEF EF improved to 45-50% Fluid status stable, recent HF exacerbations felt to be 2/2 missed HD  HTN Stable on current regimen   Follow up to establish care with a new cardiologist. AF clinic in 6 months.    Jorja Loa PA-C [facility] [address] [place], [place] [address] [phone] 04/21/2023 10:42 AM

## 2023-05-05 ENCOUNTER — Ambulatory Visit: Payer: 59 | Admitting: Interventional Cardiology

## 2023-05-06 ENCOUNTER — Observation Stay (HOSPITAL_COMMUNITY)
Admission: EM | Admit: 2023-05-06 | Discharge: 2023-05-07 | Disposition: A | Discharge status: Home / Self Care | Payer: Medicare Other | Attending: Internal Medicine | Admitting: Internal Medicine

## 2023-05-06 ENCOUNTER — Other Ambulatory Visit: Payer: Self-pay

## 2023-05-06 ENCOUNTER — Emergency Department (HOSPITAL_COMMUNITY): Payer: Medicare Other

## 2023-05-06 DIAGNOSIS — E8770 Fluid overload, unspecified: Secondary | ICD-10-CM | POA: Diagnosis not present

## 2023-05-06 DIAGNOSIS — Z91199 Patient's noncompliance with other medical treatment and regimen due to unspecified reason: Secondary | ICD-10-CM | POA: Diagnosis not present

## 2023-05-06 DIAGNOSIS — Z9889 Other specified postprocedural states: Secondary | ICD-10-CM | POA: Insufficient documentation

## 2023-05-06 DIAGNOSIS — Z1152 Encounter for screening for COVID-19: Secondary | ICD-10-CM | POA: Diagnosis not present

## 2023-05-06 DIAGNOSIS — R0602 Shortness of breath: Secondary | ICD-10-CM | POA: Insufficient documentation

## 2023-05-06 DIAGNOSIS — F1910 Other psychoactive substance abuse, uncomplicated: Secondary | ICD-10-CM | POA: Diagnosis present

## 2023-05-06 DIAGNOSIS — Z87891 Personal history of nicotine dependence: Secondary | ICD-10-CM | POA: Diagnosis not present

## 2023-05-06 DIAGNOSIS — E875 Hyperkalemia: Secondary | ICD-10-CM | POA: Diagnosis not present

## 2023-05-06 DIAGNOSIS — I5023 Acute on chronic systolic (congestive) heart failure: Secondary | ICD-10-CM | POA: Diagnosis present

## 2023-05-06 DIAGNOSIS — Z853 Personal history of malignant neoplasm of breast: Secondary | ICD-10-CM | POA: Insufficient documentation

## 2023-05-06 DIAGNOSIS — J449 Chronic obstructive pulmonary disease, unspecified: Secondary | ICD-10-CM | POA: Insufficient documentation

## 2023-05-06 DIAGNOSIS — Z8679 Personal history of other diseases of the circulatory system: Secondary | ICD-10-CM

## 2023-05-06 DIAGNOSIS — I480 Paroxysmal atrial fibrillation: Secondary | ICD-10-CM | POA: Diagnosis not present

## 2023-05-06 DIAGNOSIS — E8779 Other fluid overload: Secondary | ICD-10-CM | POA: Diagnosis not present

## 2023-05-06 DIAGNOSIS — Z8673 Personal history of transient ischemic attack (TIA), and cerebral infarction without residual deficits: Secondary | ICD-10-CM | POA: Diagnosis not present

## 2023-05-06 DIAGNOSIS — J9601 Acute respiratory failure with hypoxia: Secondary | ICD-10-CM | POA: Diagnosis present

## 2023-05-06 DIAGNOSIS — I132 Hypertensive heart and chronic kidney disease with heart failure and with stage 5 chronic kidney disease, or end stage renal disease: Secondary | ICD-10-CM | POA: Insufficient documentation

## 2023-05-06 DIAGNOSIS — N289 Disorder of kidney and ureter, unspecified: Secondary | ICD-10-CM

## 2023-05-06 DIAGNOSIS — N186 End stage renal disease: Secondary | ICD-10-CM | POA: Diagnosis not present

## 2023-05-06 DIAGNOSIS — Z95828 Presence of other vascular implants and grafts: Secondary | ICD-10-CM | POA: Diagnosis not present

## 2023-05-06 DIAGNOSIS — I69351 Hemiplegia and hemiparesis following cerebral infarction affecting right dominant side: Secondary | ICD-10-CM

## 2023-05-06 DIAGNOSIS — Z992 Dependence on renal dialysis: Secondary | ICD-10-CM | POA: Insufficient documentation

## 2023-05-06 DIAGNOSIS — I10 Essential (primary) hypertension: Secondary | ICD-10-CM | POA: Diagnosis present

## 2023-05-06 DIAGNOSIS — M545 Low back pain, unspecified: Secondary | ICD-10-CM | POA: Insufficient documentation

## 2023-05-06 DIAGNOSIS — I161 Hypertensive emergency: Secondary | ICD-10-CM | POA: Diagnosis not present

## 2023-05-06 LAB — COMPREHENSIVE METABOLIC PANEL
ALT: 10 U/L (ref 0–44)
ALT: 9 U/L (ref 0–44)
AST: 17 U/L (ref 15–41)
AST: 14 U/L — ABNORMAL LOW (ref 15–41)
Albumin: 4 g/dL (ref 3.5–5.0)
Albumin: 3.8 g/dL (ref 3.5–5.0)
Alkaline Phosphatase: 79 U/L (ref 38–126)
Alkaline Phosphatase: 78 U/L (ref 38–126)
Anion gap: 20 — ABNORMAL HIGH (ref 5–15)
Anion gap: 19 — ABNORMAL HIGH (ref 5–15)
BUN: 82 mg/dL — ABNORMAL HIGH (ref 8–23)
BUN: 83 mg/dL — ABNORMAL HIGH (ref 8–23)
CO2: 17 mmol/L — ABNORMAL LOW (ref 22–32)
CO2: 18 mmol/L — ABNORMAL LOW (ref 22–32)
Calcium: 9.2 mg/dL (ref 8.9–10.3)
Calcium: 8.9 mg/dL (ref 8.9–10.3)
Chloride: 100 mmol/L (ref 98–111)
Chloride: 101 mmol/L (ref 98–111)
Creatinine, Ser: 14.57 mg/dL — ABNORMAL HIGH (ref 0.44–1.00)
Creatinine, Ser: 14.71 mg/dL — ABNORMAL HIGH (ref 0.44–1.00)
GFR, Estimated: 2 mL/min — ABNORMAL LOW (ref >60)
GFR, Estimated: 2 mL/min — ABNORMAL LOW (ref >60)
Glucose, Bld: 128 mg/dL — ABNORMAL HIGH (ref 70–99)
Glucose, Bld: 135 mg/dL — ABNORMAL HIGH (ref 70–99)
Potassium: 5.8 mmol/L — ABNORMAL HIGH (ref 3.5–5.1)
Potassium: 6 mmol/L — ABNORMAL HIGH (ref 3.5–5.1)
Sodium: 137 mmol/L (ref 135–145)
Sodium: 138 mmol/L (ref 135–145)
Total Bilirubin: 0.8 mg/dL (ref <1.2)
Total Bilirubin: 0.8 mg/dL (ref <1.2)
Total Protein: 7.5 g/dL (ref 6.5–8.1)
Total Protein: 7.1 g/dL (ref 6.5–8.1)

## 2023-05-06 LAB — CBC
HCT: 41.9 % (ref 36.0–46.0)
HCT: 39.1 % (ref 36.0–46.0)
Hemoglobin: 13 g/dL (ref 12.0–15.0)
Hemoglobin: 12 g/dL (ref 12.0–15.0)
MCH: 30.3 pg (ref 26.0–34.0)
MCH: 30.2 pg (ref 26.0–34.0)
MCHC: 31 g/dL (ref 30.0–36.0)
MCHC: 30.7 g/dL (ref 30.0–36.0)
MCV: 97.7 fL (ref 80.0–100.0)
MCV: 98.5 fL (ref 80.0–100.0)
Platelets: 296 10*3/uL (ref 150–400)
Platelets: 276 10*3/uL (ref 150–400)
RBC: 4.29 MIL/uL (ref 3.87–5.11)
RBC: 3.97 MIL/uL (ref 3.87–5.11)
RDW: 19.7 % — ABNORMAL HIGH (ref 11.5–15.5)
RDW: 19.6 % — ABNORMAL HIGH (ref 11.5–15.5)
WBC: 6.8 10*3/uL (ref 4.0–10.5)
WBC: 7.7 10*3/uL (ref 4.0–10.5)
nRBC: 0.3 % — ABNORMAL HIGH (ref 0.0–0.2)
nRBC: 0.3 % — ABNORMAL HIGH (ref 0.0–0.2)

## 2023-05-06 LAB — RESP PANEL BY RT-PCR (RSV, FLU A&B, COVID)  RVPGX2
Influenza A by PCR: NEGATIVE
Influenza B by PCR: NEGATIVE
Resp Syncytial Virus by PCR: NEGATIVE
SARS Coronavirus 2 by RT PCR: NEGATIVE

## 2023-05-06 LAB — TROPONIN I (HIGH SENSITIVITY)
Troponin I (High Sensitivity): 75 ng/L — ABNORMAL HIGH (ref <18)
Troponin I (High Sensitivity): 73 ng/L — ABNORMAL HIGH (ref <18)

## 2023-05-06 LAB — HEPATITIS B SURFACE ANTIGEN: Hepatitis B Surface Ag: NONREACTIVE

## 2023-05-06 LAB — BRAIN NATRIURETIC PEPTIDE: B Natriuretic Peptide: >4500 pg/mL — ABNORMAL HIGH (ref 0.0–100.0)

## 2023-05-06 MED ORDER — CARVEDILOL 12.5 MG PO TABS
12.5000 mg | ORAL_TABLET | Freq: Two times a day (BID) | ORAL | Status: DC
  Administered 2023-05-06 – 2023-05-07 (×3): 12.5 mg via ORAL
  Filled 2023-05-06 (×3): qty 1

## 2023-05-06 MED ORDER — OXYCODONE-ACETAMINOPHEN 5-325 MG PO TABS
1.0000 | ORAL_TABLET | Freq: Three times a day (TID) | ORAL | Status: DC | PRN
  Administered 2023-05-06 – 2023-05-07 (×3): 1 via ORAL
  Filled 2023-05-06 (×3): qty 1

## 2023-05-06 MED ORDER — FUROSEMIDE 10 MG/ML IJ SOLN
40.0000 mg | Freq: Once | INTRAMUSCULAR | Status: AC
  Administered 2023-05-06: 40 mg via INTRAVENOUS
  Filled 2023-05-06: qty 4

## 2023-05-06 MED ORDER — ISOSORBIDE MONONITRATE ER 30 MG PO TB24
30.0000 mg | ORAL_TABLET | Freq: Every day | ORAL | Status: DC
  Administered 2023-05-06 – 2023-05-07 (×2): 30 mg via ORAL
  Filled 2023-05-06 (×2): qty 1

## 2023-05-06 MED ORDER — HYDRALAZINE HCL 50 MG PO TABS
100.0000 mg | ORAL_TABLET | Freq: Three times a day (TID) | ORAL | Status: DC
  Administered 2023-05-06 – 2023-05-07 (×3): 100 mg via ORAL
  Filled 2023-05-06 (×3): qty 2

## 2023-05-06 MED ORDER — NITROGLYCERIN 2 % TD OINT
1.0000 [in_us] | TOPICAL_OINTMENT | Freq: Once | TRANSDERMAL | Status: AC
  Administered 2023-05-06: 1 [in_us] via TOPICAL
  Filled 2023-05-06: qty 1

## 2023-05-06 MED ORDER — SENNOSIDES-DOCUSATE SODIUM 8.6-50 MG PO TABS: 1.0000 | ORAL_TABLET | Freq: Two times a day (BID) | ORAL | Status: DC | PRN

## 2023-05-06 MED ORDER — AMLODIPINE BESYLATE 10 MG PO TABS
10.0000 mg | ORAL_TABLET | Freq: Every day | ORAL | Status: DC
  Administered 2023-05-06 – 2023-05-07 (×2): 10 mg via ORAL
  Filled 2023-05-06 (×3): qty 2

## 2023-05-06 MED ORDER — SODIUM ZIRCONIUM CYCLOSILICATE 10 G PO PACK
10.0000 g | PACK | Freq: Once | ORAL | Status: AC
  Administered 2023-05-06: 10 g via ORAL
  Filled 2023-05-06: qty 1

## 2023-05-06 MED ORDER — OXYCODONE-ACETAMINOPHEN 10-325 MG PO TABS: 1.0000 | ORAL_TABLET | Freq: Three times a day (TID) | ORAL | Status: DC | PRN

## 2023-05-06 MED ORDER — SEVELAMER CARBONATE 2.4 G PO PACK
2.4000 g | PACK | Freq: Three times a day (TID) | ORAL | Status: DC
  Administered 2023-05-06 – 2023-05-07 (×2): 2.4 g via ORAL
  Filled 2023-05-06 (×6): qty 1

## 2023-05-06 MED ORDER — ONDANSETRON HCL 4 MG/2ML IJ SOLN
4.0000 mg | Freq: Once | INTRAMUSCULAR | Status: AC
  Administered 2023-05-06: 4 mg via INTRAVENOUS
  Filled 2023-05-06: qty 2

## 2023-05-06 MED ORDER — APIXABAN 2.5 MG PO TABS
2.5000 mg | ORAL_TABLET | Freq: Two times a day (BID) | ORAL | Status: DC
  Administered 2023-05-06 – 2023-05-07 (×2): 2.5 mg via ORAL
  Filled 2023-05-06 (×3): qty 1

## 2023-05-06 MED ORDER — ALBUTEROL SULFATE (2.5 MG/3ML) 0.083% IN NEBU: 2.5000 mg | INHALATION_SOLUTION | Freq: Four times a day (QID) | RESPIRATORY_TRACT | Status: DC | PRN

## 2023-05-06 MED ORDER — PANTOPRAZOLE SODIUM 40 MG PO TBEC
40.0000 mg | DELAYED_RELEASE_TABLET | Freq: Every day | ORAL | Status: DC
  Administered 2023-05-06 – 2023-05-07 (×2): 40 mg via ORAL
  Filled 2023-05-06 (×2): qty 1

## 2023-05-06 MED ORDER — NALOXONE HCL 4 MG/0.1ML NA LIQD
1.0000 | NASAL | Status: DC
Start: 2023-05-06 — End: 2023-05-06

## 2023-05-06 MED ORDER — FENTANYL CITRATE PF 50 MCG/ML IJ SOSY
50.0000 ug | PREFILLED_SYRINGE | Freq: Once | INTRAMUSCULAR | Status: AC
  Administered 2023-05-06: 50 ug via INTRAVENOUS
  Filled 2023-05-06: qty 1

## 2023-05-06 MED ORDER — PROMETHAZINE HCL 12.5 MG PO TABS: 12.5000 mg | ORAL_TABLET | Freq: Four times a day (QID) | ORAL | Status: DC | PRN

## 2023-05-06 MED ORDER — HYDRALAZINE HCL 20 MG/ML IJ SOLN
10.0000 mg | Freq: Four times a day (QID) | INTRAMUSCULAR | Status: DC | PRN
  Filled 2023-05-06: qty 1

## 2023-05-06 MED ORDER — ROSUVASTATIN CALCIUM 5 MG PO TABS
10.0000 mg | ORAL_TABLET | Freq: Every day | ORAL | Status: DC
  Administered 2023-05-06 – 2023-05-07 (×2): 10 mg via ORAL
  Filled 2023-05-06 (×2): qty 2

## 2023-05-06 MED ORDER — BISACODYL 10 MG RE SUPP: 10.0000 mg | Freq: Every day | RECTAL | Status: DC | PRN

## 2023-05-06 MED ORDER — CLONIDINE HCL 0.1 MG PO TABS
0.2000 mg | ORAL_TABLET | Freq: Two times a day (BID) | ORAL | Status: DC
  Administered 2023-05-06 – 2023-05-07 (×3): 0.2 mg via ORAL
  Filled 2023-05-06 (×2): qty 1
  Filled 2023-05-06 (×2): qty 2

## 2023-05-06 MED ORDER — OXYCODONE HCL 5 MG PO TABS: 5.0000 mg | ORAL_TABLET | Freq: Three times a day (TID) | ORAL | Status: DC | PRN

## 2023-05-06 NOTE — ED Notes (Signed)
Contacted CCMD x2 in attempt to confirm patient is on cardiac monitoring.

## 2023-05-06 NOTE — Procedures (Signed)
Patient was seen on dialysis and the procedure was supervised.  BFR 400  Via TDC BP is  176/96.   Patient appears to be tolerating treatment well.  Monique Henderson Jaynie Collins 05/06/2023

## 2023-05-06 NOTE — ED Notes (Signed)
ED TO INPATIENT HANDOFF REPORT  ED Nurse Name and Phone #:  Larey Brick, RN  S Name/Age/Gender Monique Henderson 72 y.o. female Room/Bed: H013C/H013C  Code Status   Code Status: Full Code  Home/SNF/Other Home Patient oriented to: self, place, time, and situation Is this baseline? Yes   Triage Complete: Triage complete  Chief Complaint Hyperkalemia [E87.5] ESRD (end stage renal disease) (HCC) [N18.6] Acute respiratory failure with hypoxia (HCC) [J96.01] Fluid overload [E87.70] Acute hypoxemic respiratory failure (HCC) [J96.01]  Triage Note Brought in via EMS, was 86% room air. Woke up with shortness of breath about 30 minutes ago. She is dialysis, she is compliant. She is supposed to have it today. No injuries or recent injuries. Notices some weight gain. She is usually not hypertensive and doesn't wear O2. Hx of stroke, right sided deficits.    Allergies Allergies  Allergen Reactions   Shrimp [Shellfish Allergy] Shortness Of Breath   Bactroban [Mupirocin] Other (See Comments)    "Sores in nose"   Hydrocodone Itching, Nausea Only and Nausea And Vomiting   Chlorhexidine Gluconate Itching   Tylenol [Acetaminophen] Itching    Takes Percocet at home 05/06/23   Zestril [Lisinopril] Cough    Level of Care/Admitting Diagnosis ED Disposition     ED Disposition  Admit   Condition  --   Comment  Hospital Area: [facility] [[id number]]  Level of Care: Progressive [102]  Admit to Progressive based on following criteria: RESPIRATORY PROBLEMS hypoxemic/hypercapnic respiratory failure that is responsive to NIPPV (BiPAP) or High Flow Nasal Cannula (6-80 lpm). Frequent assessment/intervention, no > Q2 hrs < Q4 hrs, to maintain oxygenation and pulmonary hygiene.  May admit patient to [facility] or [facility] if equivalent level of care is available:: No  Covid Evaluation: Asymptomatic - no recent exposure (last 10 days) testing not required  Diagnosis: Acute hypoxemic  respiratory failure [facility]) [[id number]]  Admitting Physician: Mee Hives  Attending Physician: Leroy Sea [phone]  Certification:: I certify there are rare and unusual circumstances requiring inpatient admission          B Medical/Surgery History Past Medical History:  Diagnosis Date   AF (paroxysmal atrial fibrillation) (HCC) 05/29/2019   on Coumadin   Aortic atherosclerosis (HCC) 07/05/2019   Aortic dissection (HCC) 04/04/2019   s/p repair   Bone spur 2008   Right calcaneal foot spur   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 Chevis Pretty; S/P re-excision of superior and inferior margins 10/27/2012.    ESRD on hemodialysis (HCC) 05/29/2019   Essential hypertension 09/16/2006   GERD 09/16/2006   Hepatitis C    treated and RNA confirmed not detectable 01/2017   Insomnia 03/14/2015   Malnutrition of moderate degree 05/19/2019   Non compliance with medical treatment 12/04/2017   Normocytic anemia    With thrombocytosis   Osteoarthritis    Right ureteral stone 2002   S/P lumbar spinal fusion 01/18/2014   S/P lumbar decompressive laminectomy, fusion, and plating for lumbar spinal stenosis on 05/27/2009 by  Dr. Tia Alert.  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 Tia Alert.     Stroke  (cerebrum) (HCC)    Tobacco use disorder 04/19/2009   Uterine fibroid    Wears dentures    top   Past Surgical History:  Procedure Laterality Date   ANTERIOR LAT LUMBAR FUSION N/A 01/18/2014   Procedure: ANTERIOR LATERAL LUMBAR FUSION LUMBAR TWO-THREE;  Surgeon: Tia Alert, MD;  Location: [facility];  Service: Neurosurgery;  Laterality: N/A;   ANTERIOR LUMBAR FUSION  01/18/2014   AV FISTULA PLACEMENT Left 04/20/2019   Procedure: ARTERIOVENOUS (AV) FISTULA CREATION;  Surgeon: Maeola Harman, MD;  Location: [facility] OR;  Service: Vascular;  Laterality: Left;   BACK SURGERY     BREAST LUMPECTOMY Left 01/2003   BREAST LUMPECTOMY Right 2014   BREAST LUMPECTOMY WITH NEEDLE LOCALIZATION AND AXILLARY SENTINEL LYMPH NODE BX Right 10/13/2012   Procedure: BREAST LUMPECTOMY WITH NEEDLE LOCALIZATION;  Surgeon: Robyne Askew, MD;  Location: [facility];  Service: General;  Laterality: Right;  Right breast wire localized lumpectomy   INSERTION OF DIALYSIS CATHETER Right 04/20/2019   Procedure: INSERTION OF DIALYSIS CATHETER, right internal jugular;  Surgeon: Maeola Harman, MD;  Location: [facility] OR;  Service: Vascular;  Laterality: Right;   INSERTION OF DIALYSIS CATHETER Right 09/24/2020   Procedure: INSERTION OF TUNNELED DIALYSIS CATHETER;  Surgeon: Larina Earthly, MD;  Location: [facility] OR;  Service: Vascular;  Laterality: Right;   IR FLUORO GUIDE CV LINE RIGHT  09/22/2020   IR THORACENTESIS ASP PLEURAL SPACE W/IMG GUIDE  05/19/2019   IR US GUIDE VASC ACCESS LEFT  09/22/2020   IR US GUIDE VASC ACCESS RIGHT  09/22/2020   IR VENOCAVAGRAM SVC  09/22/2020   LAMINECTOMY  05/27/2009   Lumbar decompressive laminectomy, fusion and plating for lumbar spinal stensosis   LIGATION OF ARTERIOVENOUS  FISTULA Left 09/24/2020   Procedure: LIGATION OF LEFT ARM ARTERIOVENOUS  FISTULA;  Surgeon: Larina Earthly, MD;  Location: [facility] OR;  Service: Vascular;  Laterality: Left;   LUMBAR  LAMINECTOMY/DECOMPRESSION MICRODISCECTOMY Left 03/23/2013   Procedure: LUMBAR LAMINECTOMY/DECOMPRESSION MICRODISCECTOMY 1 LEVEL;  Surgeon: Tia Alert, MD;  Location: [facility];  Service: Neurosurgery;  Laterality: Left;  LUMBAR LAMINECTOMY/DECOMPRESSION MICRODISCECTOMY 1 LEVEL   MASTECTOMY, PARTIAL Left 02/26/2003   ; S/P re-excision of cranial and lateral margins 04/19/2003.    RE-EXCISION OF BREAST CANCER,SUPERIOR MARGINS Right 10/27/2012   Procedure: RE-EXCISION OF BREAST CANCER,SUPERIOR and inferior MARGINS;  Surgeon: Robyne Askew, MD;  Location: [facility] OR;  Service: General;  Laterality: Right;   RE-EXCISION OF BREAST LUMPECTOMY Left 04/2003   TEE WITHOUT CARDIOVERSION N/A 04/04/2019   Procedure: Transesophageal Echocardiogram (Tee);  Surgeon: Linden Dolin, MD;  Location: [facility] OR;  Service: Open Heart Surgery;  Laterality: N/A;   THORACIC AORTIC ANEURYSM REPAIR N/A 04/04/2019   Procedure: THORACIC ASCENDING ANEURYSM REPAIR (AAA)  USING 28 MM X 30 CM HEMASHIELD PLATINUM VASCULAR GRAFT;  Surgeon: Linden Dolin, MD;  Location: [facility];  Service: Open Heart Surgery;  Laterality: N/A;     A IV Location/Drains/Wounds Patient Lines/Drains/Airways Status     Active Line/Drains/Airways     Name Placement date Placement time Site Days   Peripheral IV 05/06/23 22 G Anterior;Left;Proximal Forearm 05/06/23  0359  Forearm  less than 1   Peripheral IV 05/06/23 20 G 2.5" Left;Upper Arm 05/06/23  0415  Arm  less than 1   Hemodialysis Catheter Right Subclavian --  --  Subclavian  --   Hemodialysis Catheter Right Subclavian --  --  Subclavian  --            Intake/Output Last 24 hours  Intake/Output Summary (Last 24 hours) at 05/06/2023 1607 Last data filed at 05/06/2023 1345 Gross per 24 hour  Intake --  Output 3100 ml  Net -3100 ml    Labs/Imaging Results for orders placed or performed during the hospital encounter of 05/06/23 (from the past 48 hour(s))  Comprehensive metabolic  panel     Status: Abnormal   Collection Time: 05/06/23  3:32 AM  Result Value Ref Range   Sodium 137 135 - 145 mmol/L   Potassium 5.8 (H) 3.5 - 5.1 mmol/L   Chloride 100 98 - 111 mmol/L   CO2 17 (L) 22 - 32 mmol/L   Glucose, Bld 128 (H) 70 - 99 mg/dL    Comment: Glucose reference range applies only to samples taken after fasting for at least 8 hours.   BUN 82 (H) 8 - 23 mg/dL   Creatinine, Ser 16.10 (H) 0.44 - 1.00 mg/dL   Calcium 9.2 8.9 - 96.0 mg/dL   Total Protein 7.5 6.5 - 8.1 g/dL   Albumin 4.0 3.5 - 5.0 g/dL   AST 17 15 - 41 U/L   ALT 10 0 - 44 U/L   Alkaline Phosphatase 79 38 - 126 U/L   Total Bilirubin 0.8 <1.2 mg/dL   GFR, Estimated 2 (L) >60 mL/min    Comment: (NOTE) Calculated using the CKD-EPI Creatinine Equation (2021)    Anion gap 20 (H) 5 - 15    Comment: Performed at [facility], [address], [place], [place] [address]  CBC     Status: Abnormal   Collection Time: 05/06/23  3:32 AM  Result Value Ref Range   WBC 6.8 4.0 - 10.5 K/uL   RBC 4.29 3.87 - 5.11 MIL/uL   Hemoglobin 13.0 12.0 - 15.0 g/dL   HCT 81.1 91.4 - 78.2 %   MCV 97.7 80.0 - 100.0 fL   MCH 30.3 26.0 - 34.0 pg   MCHC 31.0 30.0 - 36.0 g/dL   RDW 95.6 (H) 21.3 - 08.6 %   Platelets 296 150 - 400 K/uL   nRBC 0.3 (H) 0.0 - 0.2 %    Comment: Performed at [facility], [address] [place], [place] [address]  Troponin I (High Sensitivity)     Status: Abnormal   Collection Time: 05/06/23  3:32 AM  Result Value Ref Range   Troponin I (High Sensitivity) 75 (H) <18 ng/L    Comment: (NOTE) Elevated high sensitivity troponin I (hsTnI) values and significant  changes across serial measurements may suggest ACS but many other  chronic and acute conditions are known to elevate hsTnI results.  Refer to the "Links" section for chest pain algorithms and additional  guidance. Performed at [facility], [address], [place], [place] [address]   Brain natriuretic peptide     Status:  Abnormal   Collection Time: 05/06/23  3:33 AM  Result Value Ref Range   B Natriuretic Peptide >4,500.0 (H) 0.0 - 100.0 pg/mL    Comment: Performed at [facility], [address], [place], [place] [address]  Resp panel by RT-PCR (RSV,  Flu A&B, Covid) Anterior Nasal Swab     Status: None   Collection Time: 05/06/23  4:00 AM   Specimen: Anterior Nasal Swab  Result Value Ref Range   SARS Coronavirus 2 by RT PCR NEGATIVE NEGATIVE   Influenza A by PCR NEGATIVE NEGATIVE   Influenza B by PCR NEGATIVE NEGATIVE    Comment: (NOTE) The Xpert Xpress SARS-CoV-2/FLU/RSV plus assay is intended as an aid in the diagnosis of influenza from Nasopharyngeal swab specimens and should not be used as a sole basis for treatment. Nasal washings and aspirates are unacceptable for Xpert Xpress SARS-CoV-2/FLU/RSV testing.  Fact Sheet for Patients: BloggerCourse.com  Fact Sheet for Healthcare Providers: SeriousBroker.it  This test is not yet approved or cleared by the [place] FDA and has been authorized for detection and/or diagnosis of SARS-CoV-2 by FDA under an Emergency Use Authorization (EUA). This EUA will remain in effect (meaning this test can be used) for the duration of the COVID-19 declaration under Section 564(b)(1) of the Act, 21 U.S.C. section 360bbb-3(b)(1), unless the authorization is terminated or revoked.     Resp Syncytial Virus by PCR NEGATIVE NEGATIVE    Comment: (NOTE) Fact Sheet for Patients: BloggerCourse.com  Fact Sheet for Healthcare Providers: SeriousBroker.it  This test is not yet approved or cleared by the [place] FDA and has been authorized for detection and/or diagnosis of SARS-CoV-2 by FDA under an Emergency Use Authorization (EUA). This EUA will remain in effect (meaning this test can be used) for the duration of the COVID-19 declaration under Section  564(b)(1) of the Act, 21 U.S.C. section 360bbb-3(b)(1), unless the authorization is terminated or revoked.  Performed at [facility], [address], [place], [place] [address]   Troponin I (High Sensitivity)     Status: Abnormal   Collection Time: 05/06/23  5:53 AM  Result Value Ref Range   Troponin I (High Sensitivity) 73 (H) <18 ng/L    Comment: (NOTE) Elevated high sensitivity troponin I (hsTnI) values and significant  changes across serial measurements may suggest ACS but many other  chronic and acute conditions are known to elevate hsTnI results.  Refer to the "Links" section for chest pain algorithms and additional  guidance. Performed at [facility], [address], [place], [place] [address]   CBC     Status: Abnormal   Collection Time: 05/06/23  5:53 AM  Result Value Ref Range   WBC 7.7 4.0 - 10.5 K/uL   RBC 3.97 3.87 - 5.11 MIL/uL   Hemoglobin 12.0 12.0 - 15.0 g/dL   HCT 44.0 34.7 - 42.5 %   MCV 98.5 80.0 - 100.0 fL   MCH 30.2 26.0 - 34.0 pg   MCHC 30.7 30.0 - 36.0 g/dL   RDW 95.6 (H) 38.7 - 56.4 %   Platelets 276 150 - 400 K/uL   nRBC 0.3 (H) 0.0 - 0.2 %    Comment: Performed at [facility], [address], [place], [place] [address]  Comprehensive metabolic panel     Status: Abnormal   Collection Time: 05/06/23  5:53 AM  Result Value Ref Range   Sodium 138 135 - 145 mmol/L   Potassium 6.0 (H) 3.5 - 5.1 mmol/L   Chloride 101 98 - 111 mmol/L   CO2 18 (L) 22 - 32 mmol/L   Glucose, Bld 135 (H) 70 - 99 mg/dL    Comment: Glucose reference range applies only to samples taken after fasting for at least 8  hours.   BUN 83 (H) 8 - 23 mg/dL   Creatinine, Ser 16.10 (H) 0.44 - 1.00 mg/dL   Calcium 8.9 8.9 - 96.0 mg/dL   Total Protein 7.1 6.5 - 8.1 g/dL   Albumin 3.8 3.5 - 5.0 g/dL   AST 14 (L) 15 - 41 U/L   ALT 9 0 - 44 U/L   Alkaline Phosphatase 78 38 - 126 U/L   Total Bilirubin 0.8 <1.2 mg/dL   GFR, Estimated 2 (L) >60 mL/min    Comment:  (NOTE) Calculated using the CKD-EPI Creatinine Equation (2021)    Anion gap 19 (H) 5 - 15    Comment: Performed at [facility], [address], [place], [place] [address]  Hepatitis B surface antigen     Status: None   Collection Time: 05/06/23 10:37 AM  Result Value Ref Range   Hepatitis B Surface Ag NON REACTIVE NON REACTIVE    Comment: Performed at [facility], [address] [place], [place] [address]   *Note: Due to a large number of results and/or encounters for the requested time period, some results have not been displayed. A complete set of results can be found in Results Review.   DG Chest Port 1 View  Result Date: 05/06/2023 CLINICAL DATA:  72 year old female with shortness of breath. EXAM: PORTABLE CHEST 1 VIEW COMPARISON:  Chest radiographs 04/02/2023 and earlier. FINDINGS: Portable AP semi upright view at 0337 hours. Stable right chest dual lumen dialysis type catheter. Stable cardiomegaly, Calcified aortic atherosclerosis., mediastinal contours. Previous sternotomy. Stable lung volumes. Pulmonary vascular congestion diffusely, with minimally improved right lung base ventilation since last month. Stable patchy left lung base opacity. No superimposed pneumothorax. No definite pleural effusion. IMPRESSION: Cardiomegaly and dialysis chest catheter with ongoing pulmonary interstitial edema. Patchy lung base atelectasis. No definite pleural effusion. Electronically Signed   By: Odessa Fleming M.D.   On: 05/06/2023 05:00    Pending Labs Unresulted Labs (From admission, onward)     Start     Ordered   05/06/23 1044  CBC  Once,   R        05/06/23 1044   05/06/23 1044  Creatinine, serum  Once,   R        05/06/23 1044   05/06/23 0748  Hepatitis B surface antibody,quantitative  (New Admission Hemo Labs (Hepatitis B))  Once,   R        05/06/23 0748   Signed and Held  Renal function panel  Once,   R        Signed and Held   Signed and Held  CBC  Once,   R        Signed and  Held            Vitals/Pain Today's Vitals   05/06/23 1335 05/06/23 1340 05/06/23 1345 05/06/23 1400  BP: (S) (!) 168/151 (!) 172/109 (!) 159/97   Pulse: (!) 44 64 94   Resp: 20 18 18    Temp:   97.6 F (36.4 C)   SpO2: 99% 100% 100%   PainSc:    0-No pain    Isolation Precautions No active isolations  Medications Medications  albuterol (PROVENTIL) (2.5 MG/3ML) 0.083% nebulizer solution 2.5 mg (has no administration in time range)  amLODipine (NORVASC) tablet 10 mg (has no administration in time range)  apixaban (ELIQUIS) tablet 2.5 mg (has no administration in time range)  carvedilol (COREG) tablet 12.5 mg (12.5 mg Oral Given 05/06/23  3220)  cloNIDine (CATAPRES) tablet 0.2 mg (0.2 mg Oral Given 05/06/23 0807)  isosorbide mononitrate (IMDUR) 24 hr tablet 30 mg (has no administration in time range)  hydrALAZINE (APRESOLINE) tablet 100 mg (100 mg Oral Given 05/06/23 0808)  pantoprazole (PROTONIX) EC tablet 40 mg (has no administration in time range)  rosuvastatin (CRESTOR) tablet 10 mg (has no administration in time range)  sevelamer carbonate (RENVELA) powder PACK 2.4 g (2.4 g Oral Given 05/06/23 0808)  senna-docusate (Senokot-S) tablet 1 tablet (has no administration in time range)  hydrALAZINE (APRESOLINE) injection 10 mg (has no administration in time range)  oxyCODONE-acetaminophen (PERCOCET/ROXICET) 5-325 MG per tablet 1 tablet (1 tablet Oral Given 05/06/23 2542)    And  oxyCODONE (Oxy IR/ROXICODONE) immediate release tablet 5 mg (has no administration in time range)  promethazine (PHENERGAN) tablet 12.5 mg (has no administration in time range)  bisacodyl (DULCOLAX) suppository 10 mg (has no administration in time range)  nitroGLYCERIN (NITROGLYN) 2 % ointment 1 inch (1 inch Topical Given 05/06/23 0332)  fentaNYL (SUBLIMAZE) injection 50 mcg (50 mcg Intravenous Given 05/06/23 0425)  ondansetron (ZOFRAN) injection 4 mg (4 mg Intravenous Given 05/06/23 0457)  sodium zirconium  cyclosilicate (LOKELMA) packet 10 g (10 g Oral Given 05/06/23 0512)  furosemide (LASIX) injection 40 mg (40 mg Intravenous Given 05/06/23 0519)    Mobility walks with device     Focused Assessments Pulmonary Assessment Handoff:  Lung sounds: Bilateral Breath Sounds: Clear, Diminished L Breath Sounds: Clear R Breath Sounds: Clear O2 Device: Nasal Cannula O2 Flow Rate (L/min): 2 L/min    R Recommendations: See Admitting Provider Note  Report given to:   Additional Notes:  Patient is alert and oriented x4, reports that she does not wear oxygen at home. Pt initially coming from dialysis brought in by EMS.

## 2023-05-06 NOTE — ED Provider Notes (Signed)
[place] EMERGENCY DEPARTMENT AT [facility] Provider Note   CSN: [id number] Arrival date & time: 05/06/23  4010     History  Chief Complaint  Patient presents with   Shortness of Breath   Hypertension   Level 5 caveat due to acuity of condition Monique Henderson is a 72 y.o. female.  The history is provided by the patient. The history is limited by the condition of the patient.  Shortness of Breath Hypertension Associated symptoms include shortness of breath.  Patient with extensive history including atrial fibrillation on anticoagulation, previous aortic dissection with repair, CVA with right-sided weakness, ESRD on dialysis presents with shortness of breath Patient reports waking up with chest pain and shortness of breath just prior to arrival.  No recent missed dialysis sessions. Patient was noted to be hypoxic prior to arrival  Patient has chronic weakness of the right hand and dysarthria at baseline Past Medical History:  Diagnosis Date   AF (paroxysmal atrial fibrillation) (HCC) 05/29/2019   on Coumadin   Aortic atherosclerosis (HCC) 07/05/2019   Aortic dissection (HCC) 04/04/2019   s/p repair   Bone spur 2008   Right calcaneal foot spur   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 Chevis Pretty; S/P re-excision of superior and inferior margins 10/27/2012.    ESRD on hemodialysis (HCC) 05/29/2019   Essential hypertension 09/16/2006   GERD 09/16/2006   Hepatitis C    treated and  RNA confirmed not detectable 01/2017   Insomnia 03/14/2015   Malnutrition of moderate degree 05/19/2019   Non compliance with medical treatment 12/04/2017   Normocytic anemia    With thrombocytosis   Osteoarthritis    Right ureteral stone 2002   S/P lumbar spinal fusion 01/18/2014   S/P lumbar decompressive laminectomy, fusion, and plating for lumbar spinal stenosis on 05/27/2009 by Dr. Tia Alert.  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 Tia Alert.     Stroke (cerebrum) (HCC)    Tobacco use disorder 04/19/2009   Uterine fibroid    Wears dentures    top    Home Medications Prior to Admission medications   Medication Sig Start Date End Date Taking? Authorizing Provider  albuterol (VENTOLIN HFA) 108 (90 Base) MCG/ACT inhaler Inhale 2 puffs into the lungs every 6 (six) hours as needed for wheezing or shortness of breath. 07/30/20   Janeece Agee, NP  amLODipine (NORVASC) 10 MG tablet Take 1 tablet (10 mg total) by mouth daily. 03/06/22   Setzer, Lynnell Jude, PA-C  apixaban (ELIQUIS) 2.5 MG TABS tablet Take 1 tablet (2.5 mg total) by mouth 2 (two) times daily. 03/06/22   Setzer, Lynnell Jude, PA-C  carvedilol (COREG) 25 MG tablet Take 1 tablet (25 mg total) by mouth 2 (two) times daily with a meal. 05/01/22 05/02/23  Ghimire, Werner Lean, MD  Cholecalciferol (VITAMIN D-3 PO) Take 1  capsule by mouth daily.    [provider]  cloNIDine (CATAPRES) 0.1 MG tablet Take 1 tablet (0.1 mg total) by mouth 2 (two) times daily. 12/18/22   Corky Crafts, MD  hydrALAZINE (APRESOLINE) 100 MG tablet Take 1 tablet (100 mg total) by mouth every 8 (eight) hours. 12/18/22   Corky Crafts, MD  isosorbide mononitrate (IMDUR) 30 MG 24 hr tablet TAKE 1 TABLET (30MG ) ON DIALYSIS DAYS AND 2 TABLETS (60 MG)  ON NON DIALYSIS DAYS. 01/12/23   Corky Crafts, MD  naloxone [facility]) nasal spray 4 mg/0.1 mL 1 spray as directed. 02/19/22   [provider]  Oxycodone HCl 10 MG TABS Take 10 mg by mouth 3 (three) times daily as needed (For more moderate pain). 02/02/23   [provider]  oxyCODONE-acetaminophen (PERCOCET) 10-325 MG tablet Take 1 tablet by mouth 3 (three) times daily as needed for pain. 03/12/22   [provider]  pantoprazole (PROTONIX) 40 MG tablet TAKE 1 TABLET (40 MG TOTAL) BY MOUTH 2 (TWO) TIMES DAILY. Patient taking differently: Take 40 mg by mouth daily. 10/08/22   Georganna Skeans, MD  rosuvastatin (CRESTOR) 10 MG tablet TAKE 1 TABLET (10 MG TOTAL) BY MOUTH DAILY FOR CHOLESTEROL Patient taking differently: Take 10 mg by mouth daily. 08/07/22   Georganna Skeans, MD  senna-docusate (SENOKOT-S) 8.6-50 MG tablet Take 1 tablet by mouth 2 (two) times daily between meals as needed for mild constipation. 08/14/22   Almon Hercules, MD  sevelamer carbonate (RENVELA) 2.4 g PACK Take 2.4 g by mouth 3 (three) times daily with meals. 03/06/22   Setzer, Lynnell Jude, PA-C  traZODone (DESYREL) 50 MG tablet Take 25-50 mg by mouth at bedtime. 03/19/23   [provider]      Allergies    Shrimp [shellfish allergy], Bactroban [mupirocin], Hydrocodone, Chlorhexidine gluconate, Tylenol [acetaminophen], and Zestril [lisinopril]    Review of Systems   Review of Systems  Unable to perform ROS: Acuity of condition  Respiratory:  Positive for shortness of breath.     Physical Exam Updated Vital Signs BP (!) 165/133   Pulse 98   Temp 97.8 F (36.6 C)   Resp 20   SpO2 94%  Physical Exam CONSTITUTIONAL: Anxious and ill-appearing HEAD: Normocephalic/atraumatic EYES: EOMI/PERRL ENMT: Mucous membranes moist NECK: supple no meningeal signs, JVD noted CV: S1/S2 noted LUNGS: Tachypnea, mild hypoxia on room air, crackles bilaterally ABDOMEN: soft, nontender,  NEURO: Pt is  awake/alert, dysarthria noted.  She is able to move all 4 extremities EXTREMITIES: pulses normal/equal, full ROM SKIN: warm, color normal, dialysis catheter right chest PSYCH: anxious ED Results / Procedures / Treatments   Labs (all labs ordered are listed, but only abnormal results are displayed) Labs Reviewed  COMPREHENSIVE METABOLIC PANEL - Abnormal; Notable for the following components:      Result Value   Potassium 5.8 (*)    CO2 17 (*)    Glucose, Bld 128 (*)    BUN 82 (*)    Creatinine, Ser 14.57 (*)    GFR, Estimated 2 (*)    Anion gap 20 (*)    All other components within normal limits  CBC - Abnormal; Notable for the following components:   RDW 19.7 (*)    nRBC 0.3 (*)    All other components within normal limits  BRAIN NATRIURETIC PEPTIDE - Abnormal; Notable for the following components:   B Natriuretic Peptide >4,500.0 (*)    All other components within normal limits  TROPONIN I (HIGH SENSITIVITY) - Abnormal; Notable for the following components:   Troponin I (High Sensitivity) 75 (*)    All other components within normal limits  RESP PANEL BY RT-PCR (RSV, FLU A&B, COVID)  RVPGX2  CBC  COMPREHENSIVE METABOLIC PANEL  TROPONIN I (HIGH SENSITIVITY)    EKG EKG Interpretation Date/Time:  Thursday May 06 2023 03:20:55 EST Ventricular Rate:  100 PR Interval:  177 QRS Duration:  142 QT Interval:  409 QTC Calculation: 528 R Axis:   63  Text Interpretation: Sinus tachycardia Ventricular premature complex Right bundle branch block Interpretation limited secondary to artifact Abnormal ekg Confirmed by Zadie Rhine ([id number]) on 05/06/2023 3:35:07 AM Prehospital EKG was reviewed.  Sinus tachycardia with right bundle branch block.,  No acute ST changes. Radiology DG Chest Port 1 View  Result Date: 05/06/2023 CLINICAL DATA:  72 year old female with shortness of breath. EXAM: PORTABLE CHEST 1 VIEW COMPARISON:  Chest radiographs 04/02/2023 and earlier. FINDINGS:  Portable AP semi upright view at 0337 hours. Stable right chest dual lumen dialysis type catheter. Stable cardiomegaly, Calcified aortic atherosclerosis., mediastinal contours. Previous sternotomy. Stable lung volumes. Pulmonary vascular congestion diffusely, with minimally improved right lung base ventilation since last month. Stable patchy left lung base opacity. No superimposed pneumothorax. No definite pleural effusion. IMPRESSION: Cardiomegaly and dialysis chest catheter with ongoing pulmonary interstitial edema. Patchy lung base atelectasis. No definite pleural effusion. Electronically Signed   By: Odessa Fleming M.D.   On: 05/06/2023 05:00    Procedures .Critical Care  Performed by: Zadie Rhine, MD Authorized by: Zadie Rhine, MD   Critical care provider statement:    Critical care time (minutes):  45   Critical care start time:  05/06/2023 4:30 AM   Critical care end time:  05/06/2023 5:15 AM   Critical care time was exclusive of:  Separately billable procedures and treating other patients   Critical care was necessary to treat or prevent imminent or life-threatening deterioration of the following conditions:  Respiratory failure, cardiac failure and renal failure   Critical care was time spent personally by me on the following activities:  Examination of patient, development of treatment plan with patient or surrogate, pulse oximetry, re-evaluation of patient's condition, ordering and review of radiographic studies, ordering and review of laboratory studies and review of old charts   I assumed direction of critical care for this patient from another provider in my specialty: no     Care discussed with: admitting provider       Medications Ordered in ED Medications  nitroGLYCERIN (NITROGLYN) 2 % ointment 1 inch (1 inch Topical Given 05/06/23 0332)  fentaNYL (SUBLIMAZE) injection 50 mcg (50 mcg Intravenous Given 05/06/23 0425)  ondansetron (ZOFRAN) injection 4 mg (4 mg Intravenous Given  05/06/23 0457)  sodium zirconium cyclosilicate (LOKELMA) packet 10 g (10 g Oral Given 05/06/23 0512)  furosemide (LASIX) injection 40 mg (40 mg Intravenous Given 05/06/23 0519)    ED Course/ Medical Decision Making/ A&P Clinical Course as of 05/06/23 0530  Thu May 06, 2023  0515 Patient found to have mild hyperkalemia, as well as pulmonary edema.  She will be admitted for dialysis  Patient reports feeling improved after meds.  She does maintain urine output, will give Lasix [DW]  [phone] Discussed with Dr. Maisie Fus with hospitalist will be admitted for acute hypoxic respiratory failure in the setting of pulmonary edema with history of end-stage renal disease [DW]    Clinical Course User Index [DW] Zadie Rhine, MD  Medical Decision Making Amount and/or Complexity of Data Reviewed Labs: ordered. Radiology: ordered.  Risk Prescription drug management. Decision regarding hospitalization.   This patient presents to the ED for concern of shortness of breath, this involves an extensive number of treatment options, and is a complaint that carries with it a high risk of complications and morbidity.  The differential diagnosis includes but is not limited to Acute coronary syndrome, pneumonia, acute pulmonary edema, pneumothorax, acute anemia, pulmonary embolism   Comorbidities that complicate the patient evaluation: Patient's presentation is complicated by their history of end-stage renal disease  Social Determinants of Health: Patient's  multiple ER visits, dysarthria at baseline inhibits communication   increases the complexity of managing their presentation  Additional history obtained: Records reviewed previous admission documents  Lab Tests: I Ordered, and personally interpreted labs.  The pertinent results include:  mild hyperkalemia  Imaging Studies ordered: I ordered imaging studies including X-ray chest   I independently visualized and  interpreted imaging which showed pulmonary edema I agree with the radiologist interpretation  Cardiac Monitoring: The patient was maintained on a cardiac monitor.  I personally viewed and interpreted the cardiac monitor which showed an underlying rhythm of:  sinus tachycardia  Medicines ordered and prescription drug management: I ordered medication including nitroglycerin for hypertension Reevaluation of the patient after these medicines showed that the patient    improved   Critical Interventions:   blood pressure management, admission  Consultations Obtained: I requested consultation with the admitting physician [facility] , and discussed  findings as well as pertinent plan - they recommend: Admit  Reevaluation: After the interventions noted above, I reevaluated the patient and found that they have :improved  Complexity of problems addressed: Patient's presentation is most consistent with  acute presentation with potential threat to life or bodily function  Disposition: After consideration of the diagnostic results and the patient's response to treatment,  I feel that the patent would benefit from admission   .           Final Clinical Impression(s) / ED Diagnoses Final diagnoses:  ESRD (end stage renal disease) (HCC)  Acute respiratory failure with hypoxia (HCC)  Hyperkalemia    Rx / DC Orders ED Discharge Orders     None         Zadie Rhine, MD 05/06/23 [phone]

## 2023-05-06 NOTE — H&P (Signed)
TRH H&P   Patient Demographics:    Monique Henderson, is a 72 y.o. female  MRN: [id number]   DOB - [birth year]  Admit Date - 05/06/2023  Outpatient Primary MD for the patient is Fleet Contras, MD  Patient coming from: Home  Chief Complaint  Patient presents with   Shortness of Breath   Hypertension      HPI:    Monique Henderson  is a 72 y.o. female, with H/O ESRD, MWF via R clavian HD catheter, paroxysmal A-fib on Eliquis, hypertension and poor control, COPD, history of right-sided breast cancer s/p lumpectomy in 2014, GERD, hep C treated in 2018, moderate protein calorie malnutrition, history of stroke with mild right-sided deficits, chronic low back pain on narcotics, s/p lumbar spinal fusion surgery in 2010 by Dr. Yetta Barre, who presents to the hospital with shortness of breath starting 12 to 14 hours, worse this morning upon waking up.  In the ER found to be in mild pulmonary edema due to fluid overload, blood pressure in poor control due to the respiratory distress, she was placed on 2 L oxygen with much improvement.  Hospitalist team was called to admit the patient.  Currently after application of oxygen patient is feeling a lot better, mild generalized headache, no chest or abdominal pain, shortness of breath has considerably improved, no orthopnea, no abdominal pain, no diarrhea or dysuria, no blood in stool or urine, she does have mild right arm weakness and facial droop from previous stroke but no new focal weakness.  She does have mild dysarthria from previous stroke as well.    Review of systems:    A full 10 point Review of Systems was done, except as stated above, all other Review of Systems were  negative.   With Past History of the following :    Past Medical History:  Diagnosis Date   AF (paroxysmal atrial fibrillation) (HCC) 05/29/2019   on Coumadin   Aortic atherosclerosis (HCC) 07/05/2019   Aortic dissection (HCC) 04/04/2019   s/p repair   Bone spur 2008   Right calcaneal foot spur   Breast cancer (HCC) 2004   Ductal carcinoma in situ of the left breast; S/P left partial mastectomy 02/26/2003; S/P re-excision of cranial and lateral margins11/18/2004.radiation   Carotid artery disease (HCC)    Cerebral thrombosis with cerebral infarction 05/22/2019  Chronic HFrEF (heart failure with reduced ejection fraction) (HCC)    Chronic low back pain 06/22/2016   Chronic obstructive lung disease (HCC) 01/16/2017   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 Chevis Pretty; S/P re-excision of superior and inferior margins 10/27/2012.    ESRD on hemodialysis (HCC) 05/29/2019   Essential hypertension 09/16/2006   GERD 09/16/2006   Hepatitis C    treated and RNA confirmed not detectable 01/2017   Insomnia 03/14/2015   Malnutrition of moderate degree 05/19/2019   Non compliance with medical treatment 12/04/2017   Normocytic anemia    With thrombocytosis   Osteoarthritis    Right ureteral stone 2002   S/P lumbar spinal fusion 01/18/2014   S/P lumbar decompressive laminectomy, fusion, and plating for lumbar spinal stenosis on 05/27/2009 by Dr. Tia Alert.  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 Tia Alert.     Stroke (cerebrum) (HCC)    Tobacco use disorder 04/19/2009   Uterine fibroid    Wears dentures    top      Past Surgical History:  Procedure Laterality Date   ANTERIOR LAT LUMBAR FUSION N/A 01/18/2014   Procedure:  ANTERIOR LATERAL LUMBAR FUSION LUMBAR TWO-THREE;  Surgeon: Tia Alert, MD;  Location: [facility];  Service: Neurosurgery;  Laterality: N/A;   ANTERIOR LUMBAR FUSION  01/18/2014   AV FISTULA PLACEMENT Left 04/20/2019   Procedure: ARTERIOVENOUS (AV) FISTULA CREATION;  Surgeon: Maeola Harman, MD;  Location: [facility] OR;  Service: Vascular;  Laterality: Left;   BACK SURGERY     BREAST LUMPECTOMY Left 01/2003   BREAST LUMPECTOMY Right 2014   BREAST LUMPECTOMY WITH NEEDLE LOCALIZATION AND AXILLARY SENTINEL LYMPH NODE BX Right 10/13/2012   Procedure: BREAST LUMPECTOMY WITH NEEDLE LOCALIZATION;  Surgeon: Robyne Askew, MD;  Location: [facility];  Service: General;  Laterality: Right;  Right breast wire localized lumpectomy   INSERTION OF DIALYSIS CATHETER Right 04/20/2019   Procedure: INSERTION OF DIALYSIS CATHETER, right internal jugular;  Surgeon: Maeola Harman, MD;  Location: [facility];  Service: Vascular;  Laterality: Right;   INSERTION OF DIALYSIS CATHETER Right 09/24/2020   Procedure: INSERTION OF TUNNELED DIALYSIS CATHETER;  Surgeon: Larina Earthly, MD;  Location: [facility] OR;  Service: Vascular;  Laterality: Right;   IR FLUORO GUIDE CV LINE RIGHT  09/22/2020   IR THORACENTESIS ASP PLEURAL SPACE W/IMG GUIDE  05/19/2019   IR US GUIDE VASC ACCESS LEFT  09/22/2020   IR US GUIDE VASC ACCESS RIGHT  09/22/2020   IR VENOCAVAGRAM SVC  09/22/2020   LAMINECTOMY  05/27/2009   Lumbar decompressive laminectomy, fusion and plating for lumbar spinal stensosis   LIGATION OF ARTERIOVENOUS  FISTULA Left 09/24/2020   Procedure: LIGATION OF LEFT ARM ARTERIOVENOUS  FISTULA;  Surgeon: Larina Earthly, MD;  Location: [facility] OR;  Service: Vascular;  Laterality: Left;   LUMBAR LAMINECTOMY/DECOMPRESSION MICRODISCECTOMY Left 03/23/2013   Procedure: LUMBAR LAMINECTOMY/DECOMPRESSION MICRODISCECTOMY 1 LEVEL;  Surgeon: Tia Alert, MD;  Location: [facility];  Service: Neurosurgery;  Laterality: Left;   LUMBAR LAMINECTOMY/DECOMPRESSION MICRODISCECTOMY 1 LEVEL   MASTECTOMY, PARTIAL Left 02/26/2003   ; S/P re-excision of cranial and lateral margins 04/19/2003.    RE-EXCISION OF BREAST CANCER,SUPERIOR MARGINS Right 10/27/2012   Procedure:  RE-EXCISION OF BREAST CANCER,SUPERIOR and inferior MARGINS;  Surgeon: Robyne Askew, MD;  Location: [facility] OR;  Service: General;  Laterality: Right;   RE-EXCISION OF BREAST LUMPECTOMY Left 04/2003   TEE WITHOUT CARDIOVERSION N/A 04/04/2019   Procedure: Transesophageal Echocardiogram (Tee);  Surgeon: Linden Dolin, MD;  Location: [facility] OR;  Service: Open Heart Surgery;  Laterality: N/A;   THORACIC AORTIC ANEURYSM REPAIR N/A 04/04/2019   Procedure: THORACIC ASCENDING ANEURYSM REPAIR (AAA)  USING 28 MM X 30 CM HEMASHIELD PLATINUM VASCULAR GRAFT;  Surgeon: Linden Dolin, MD;  Location: [facility] OR;  Service: Open Heart Surgery;  Laterality: N/A;      Social History:     Social History   Tobacco Use   Smoking status: Former    Current packs/day: 0.00    Average packs/day: 0.3 packs/day for 44.0 years (11.0 ttl pk-yrs)    Types: Cigarettes    Start date: 20    Quit date: 2022    Years since quitting: 2.9   Smokeless tobacco: Never   Tobacco comments:    Former smoker 04/21/23  Substance Use Topics   Alcohol use: Not Currently    Alcohol/week: 2.0 standard drinks of alcohol    Types: 2 Cans of beer per week    Comment: "couple beers a weekend", not currently         Family History :     Family History  Problem Relation Age of Onset   Colon cancer Mother 67   Hypertension Mother    Diabetes Sister 61   Hypertension Sister    Diabetes Brother    Hypertension Brother    Diabetes Brother    Hypertension Brother    Kidney disease Son        s/p renal transplant   Hypertension Son    Diabetes Son    Multiple sclerosis Son    Bone cancer Sister 3   Breast cancer Neg Hx    Cervical cancer Neg Hx        Home Medications:   Prior to  Admission medications   Medication Sig Start Date End Date Taking? Authorizing Provider  albuterol (VENTOLIN HFA) 108 (90 Base) MCG/ACT inhaler Inhale 2 puffs into the lungs every 6 (six) hours as needed for wheezing or shortness of breath. 07/30/20   Janeece Agee, NP  amLODipine (NORVASC) 10 MG tablet Take 1 tablet (10 mg total) by mouth daily. 03/06/22   Setzer, Lynnell Jude, PA-C  apixaban (ELIQUIS) 2.5 MG TABS tablet Take 1 tablet (2.5 mg total) by mouth 2 (two) times daily. 03/06/22   Setzer, Lynnell Jude, PA-C  carvedilol (COREG) 25 MG tablet Take 1 tablet (25 mg total) by mouth 2 (two) times daily with a meal. 05/01/22 05/02/23  Ghimire, Werner Lean, MD  Cholecalciferol (VITAMIN D-3 PO) Take 1 capsule by mouth daily.    [provider]  cloNIDine (CATAPRES) 0.1 MG tablet Take 1 tablet (0.1 mg total) by mouth 2 (two) times daily. 12/18/22   Corky Crafts, MD  hydrALAZINE (APRESOLINE) 100 MG tablet Take 1 tablet (100 mg total) by mouth every 8 (eight) hours. 12/18/22   Corky Crafts, MD  isosorbide mononitrate (IMDUR) 30 MG 24 hr tablet TAKE 1 TABLET (30MG ) ON DIALYSIS DAYS AND 2 TABLETS (60 MG) ON NON DIALYSIS DAYS. 01/12/23   Corky Crafts, MD  naloxone [facility]) nasal spray 4 mg/0.1 mL 1 spray as directed. 02/19/22   [provider]  Oxycodone HCl 10 MG TABS  Take 10 mg by mouth 3 (three) times daily as needed (For more moderate pain). 02/02/23   [provider]  oxyCODONE-acetaminophen (PERCOCET) 10-325 MG tablet Take 1 tablet by mouth 3 (three) times daily as needed for pain. 03/12/22   [provider]  pantoprazole (PROTONIX) 40 MG tablet TAKE 1 TABLET (40 MG TOTAL) BY MOUTH 2 (TWO) TIMES DAILY. Patient taking differently: Take 40 mg by mouth daily. 10/08/22   Georganna Skeans, MD  rosuvastatin (CRESTOR) 10 MG tablet TAKE 1 TABLET (10 MG TOTAL) BY MOUTH DAILY FOR CHOLESTEROL Patient taking differently: Take 10 mg by mouth daily. 08/07/22   Georganna Skeans, MD   senna-docusate (SENOKOT-S) 8.6-50 MG tablet Take 1 tablet by mouth 2 (two) times daily between meals as needed for mild constipation. 08/14/22   Almon Hercules, MD  sevelamer carbonate (RENVELA) 2.4 g PACK Take 2.4 g by mouth 3 (three) times daily with meals. 03/06/22   Setzer, Lynnell Jude, PA-C  traZODone (DESYREL) 50 MG tablet Take 25-50 mg by mouth at bedtime. 03/19/23   [provider]     Allergies:     Allergies  Allergen Reactions   Shrimp [Shellfish Allergy] Shortness Of Breath   Bactroban [Mupirocin] Other (See Comments)    "Sores in nose"   Hydrocodone Itching, Nausea Only and Nausea And Vomiting   Chlorhexidine Gluconate Itching   Tylenol [Acetaminophen] Itching    Takes Percocet at home 05/06/23   Zestril [Lisinopril] Cough     Physical Exam:   Vitals  Blood pressure (!) 194/117, pulse 85, temperature 97.8 F (36.6 C), resp. rate (!) 23, SpO2 100%.   1. General -frail middle-aged African-American female lying in hospital bed in no apparent distress wearing 2 L nasal cannula oxygen,  2. Normal affect and insight, Not Suicidal or Homicidal, Awake Alert,   3. No F.N deficits, ALL C.Nerves Intact, Strength 5/5 all 4 extremities, Sensation intact all 4 extremities, Plantars down going.  4. Ears and Eyes appear Normal, Conjunctivae clear, PERRLA. Moist Oral Mucosa.  5. Supple Neck, No JVD, No cervical lymphadenopathy appriciated, No Carotid Bruits.  6. Symmetrical Chest wall movement, Good air movement bilaterally, few bibasilar crackles, right-sided subclavian HD catheter in place  7. RRR, No Gallops, Rubs or Murmurs, No Parasternal Heave.  8. Positive Bowel Sounds, Abdomen Soft, No tenderness, No organomegaly appriciated,No rebound -guarding or rigidity.  9.  No Cyanosis, Normal Skin Turgor, No Skin Rash or Bruise.  Trace lower extremity edema,  10. Good muscle tone,  joints appear normal , no effusions, Normal ROM.  11. No Palpable Lymph Nodes in Neck or  Axillae      Data Review:   Recent Labs  Lab 05/06/23 0332 05/06/23 0553  WBC 6.8 7.7  HGB 13.0 12.0  HCT 41.9 39.1  PLT 296 276  MCV 97.7 98.5  MCH 30.3 30.2  MCHC 31.0 30.7  RDW 19.7* 19.6*    Recent Labs  Lab 05/06/23 0332 05/06/23 0333 05/06/23 0553  NA 137  --  138  K 5.8*  --  6.0*  CL 100  --  101  CO2 17*  --  18*  ANIONGAP 20*  --  19*  GLUCOSE 128*  --  135*  BUN 82*  --  83*  CREATININE 14.57*  --  14.71*  AST 17  --  14*  ALT 10  --  9  ALKPHOS 79  --  78  BILITOT 0.8  --  0.8  ALBUMIN 4.0  --  3.8  BNP  --  >4,500.0*  --   CALCIUM 9.2  --  8.9    Lab Results  Component Value Date   CHOL 104 01/29/2023   HDL 49 01/29/2023   LDLCALC 46 01/29/2023   TRIG 46 01/29/2023   CHOLHDL 2.1 01/29/2023    Recent Labs  Lab 05/06/23 0332 05/06/23 0333 05/06/23 0553  BNP  --  >4,500.0*  --   CALCIUM 9.2  --  8.9    Recent Labs  Lab 05/06/23 0332 05/06/23 0553  WBC 6.8 7.7  PLT 296 276  CREATININE 14.57* 14.71*    Urinalysis    Component Value Date/Time   COLORURINE AMBER (A) 12/27/2021 1840   APPEARANCEUR TURBID (A) 12/27/2021 1840   LABSPEC 1.013 12/27/2021 1840   PHURINE 8.0 12/27/2021 1840   GLUCOSEU NEGATIVE 12/27/2021 1840   GLUCOSEU NEG mg/dL 81/19/1478 2956   HGBUR MODERATE (A) 12/27/2021 1840   BILIRUBINUR NEGATIVE 12/27/2021 1840   BILIRUBINUR negative 05/04/2018 0910   KETONESUR NEGATIVE 12/27/2021 1840   PROTEINUR >=300 (A) 12/27/2021 1840   UROBILINOGEN 0.2 05/04/2018 0910   UROBILINOGEN 1 02/14/2014 0946   NITRITE NEGATIVE 12/27/2021 1840   LEUKOCYTESUR LARGE (A) 12/27/2021 1840      Imaging Results:    DG Chest Port 1 View  Result Date: 05/06/2023 CLINICAL DATA:  72 year old female with shortness of breath. EXAM: PORTABLE CHEST 1 VIEW COMPARISON:  Chest radiographs 04/02/2023 and earlier. FINDINGS: Portable AP semi upright view at 0337 hours. Stable right chest dual lumen dialysis type catheter. Stable  cardiomegaly, Calcified aortic atherosclerosis., mediastinal contours. Previous sternotomy. Stable lung volumes. Pulmonary vascular congestion diffusely, with minimally improved right lung base ventilation since last month. Stable patchy left lung base opacity. No superimposed pneumothorax. No definite pleural effusion. IMPRESSION: Cardiomegaly and dialysis chest catheter with ongoing pulmonary interstitial edema. Patchy lung base atelectasis. No definite pleural effusion. Electronically Signed   By: Odessa Fleming M.D.   On: 05/06/2023 05:00    My personal review of EKG: Rhythm NSR, sinus tachycardia, right bundle branch block, nonspecific ST changes   Assessment & Plan:   Acute hypoxic respiratory failure caused by fluid overload in a patient with HD due to poor compliance with fluid and salt restriction.  Mild acute on chronic diastolic heart failure EF 50% due to as stated before.  Patient currently after application of 2 L nasal cannula is feeling a whole lot better, nephrology team has been contacted already, dialysis being arranged in the next few hours, patient has been counseled on renal diet and fluid restriction but she seems to have poor understanding of the same, will request nutritionist to talk to her.  Continue nebulizer treatments as needed, monitor salt and fluid intake.  In the long-term her dry weight might need to be readjusted.  2.  ESRD mild hyperkalemia.  MWF schedule, Access is right IJ HD catheter.  Nephrology has been consulted dialysis soon.  Dry weight may need to be adjusted.  Axalid given for hyperkalemia, EKG is stable, telemonitoring, HD soon.  3.  History of chronic diastolic CHF.  Mild acute on chronic as in #1 above.  EF 50%.  Continue home beta-blocker, salt and fluid restriction, treatment as in #1 above, EKG nonacute, no chest pain, no orthopnea or PND.  Monitor.  4.  Paroxysmal atrial fibrillation.  [place] vas 2 score of greater than 3.  Continue Coreg along with  Eliquis.  5.  Hypertension.  In poor control.  Did not  take her morning medications yet, morning medications ordered as needed IV hydralazine ordered.  With application of oxygen and improvement in shortness of breath blood pressure is improving.  6.  Chronic back pain.  Status post lumbar fusion surgeries by Dr. Yetta Barre several years ago.  On chronic narcotics.  Use with caution.  As needed Narcan on board.  7.  HX of stroke with mild residual right arm weakness, facial droop and mild dysarthria.  Supportive care.  Continue statin and Eliquis for secondary prevention.  8.  GERD.  On PPI.  9.  COPD.  No acute issues.  Home nebulizer treatments continue, currently on 2 L oxygen, ambulatory pulse ox valve after HD, may benefit from home oxygen if she qualifies.  10.  Dyslipidemia.  On statin.     DVT Prophylaxis Eliquis  AM Labs Ordered, also please review Full Orders  Family Communication: Admission, patients condition and plan of care including tests being ordered have been discussed with the patient who indicates understanding and agree with the plan and Code Status.  Code Status Full  Likely DC to  TBD  Condition GUARDED    Consults called: Renal    Admission status: Inpt    Time spent in minutes : 35  Signature  -    Susa Raring M.D on 05/06/2023 at 7:57 AM   -  To page go to [email or web address]

## 2023-05-06 NOTE — Consult Note (Signed)
[facility] Nephrology Consult Note: Reason for Consult: To manage dialysis and dialysis related needs Referring Physician: Dr. Thedore Mins, Bess Harvest  HPI:  Monique Henderson is an 72 y.o. female with past medical history significant for hypertension, right breast cancer status postlumpectomy, COPD, A-fib on Eliquis, ESRD on HD presented with shortness of breath and hypoxia seen as a consultation for the evaluation and management of ESRD. It seems like the patient had last dialysis treatment on 11/30 and then missed treatment last Tuesday.  History of frequently missing HD treatment.  She has recurrent gradual worsening of shortness of breath and noted to be hypoxic in room air.  In the ER she had a mild pulm edema associated with hypoxia and respite distress.  She was placed on 2 L of oxygen with some improvement.  We are asked to facilitate dialysis sooner. Patient was seen and examined at the dialysis unit.  She is tolerating well.  Reports her breathing is much better.  Denies nausea, vomiting, chest pain, fever or chills. The labs showed potassium 6.0, BUN 83, BNP more than 4500, hemoglobin 12.  Chest x-ray with cardiomegaly and acute pulmonary edema. OP HD orders:  Dialyzes at  [facility], TTS, 3.5 hr  EDW 44.9 kg, 2k2ca, RIJ TDC. Mircera 225 mcg on 11/25 Hectorol 9 mcg three times per week  Sensipar 60 mg  Past Medical History:  Diagnosis Date   AF (paroxysmal atrial fibrillation) (HCC) 05/29/2019   on Coumadin   Aortic atherosclerosis (HCC) 07/05/2019   Aortic dissection (HCC) 04/04/2019   s/p repair   Bone spur 2008   Right calcaneal foot spur   Breast cancer (HCC) 2004   Ductal carcinoma in situ of the left breast; S/P left partial mastectomy 02/26/2003; S/P re-excision of cranial and lateral margins11/18/2004.radiation   Carotid artery disease (HCC)    Cerebral thrombosis with cerebral infarction 05/22/2019   Chronic HFrEF (heart failure with reduced ejection fraction)  (HCC)    Chronic low back pain 06/22/2016   Chronic obstructive lung disease (HCC) 01/16/2017   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 Chevis Pretty; S/P re-excision of superior and inferior margins 10/27/2012.    ESRD on hemodialysis (HCC) 05/29/2019   Essential hypertension 09/16/2006   GERD 09/16/2006   Hepatitis C    treated and RNA confirmed not detectable 01/2017   Insomnia 03/14/2015   Malnutrition of moderate degree 05/19/2019   Non compliance with medical treatment 12/04/2017   Normocytic anemia    With thrombocytosis   Osteoarthritis    Right ureteral stone 2002   S/P lumbar spinal fusion 01/18/2014   S/P lumbar decompressive laminectomy, fusion, and plating for lumbar spinal stenosis on 05/27/2009 by Dr. Tia Alert.  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 Tia Alert.     Stroke (cerebrum) (HCC)    Tobacco use disorder 04/19/2009   Uterine fibroid    Wears dentures    top    Past Surgical History:  Procedure Laterality Date   ANTERIOR LAT LUMBAR FUSION N/A 01/18/2014   Procedure: ANTERIOR LATERAL LUMBAR FUSION LUMBAR TWO-THREE;  Surgeon: Tia Alert, MD;  Location: [facility];  Service: Neurosurgery;  Laterality: N/A;   ANTERIOR LUMBAR FUSION  01/18/2014   AV FISTULA PLACEMENT Left 04/20/2019  Procedure: ARTERIOVENOUS (AV) FISTULA CREATION;  Surgeon: Maeola Harman, MD;  Location: [facility] OR;  Service: Vascular;  Laterality: Left;   BACK SURGERY     BREAST LUMPECTOMY Left 01/2003   BREAST LUMPECTOMY Right 2014   BREAST LUMPECTOMY WITH NEEDLE LOCALIZATION AND AXILLARY SENTINEL LYMPH NODE BX Right 10/13/2012   Procedure: BREAST LUMPECTOMY WITH NEEDLE LOCALIZATION;  Surgeon: Robyne Askew, MD;  Location:  [facility];  Service: General;  Laterality: Right;  Right breast wire localized lumpectomy   INSERTION OF DIALYSIS CATHETER Right 04/20/2019   Procedure: INSERTION OF DIALYSIS CATHETER, right internal jugular;  Surgeon: Maeola Harman, MD;  Location: [facility] OR;  Service: Vascular;  Laterality: Right;   INSERTION OF DIALYSIS CATHETER Right 09/24/2020   Procedure: INSERTION OF TUNNELED DIALYSIS CATHETER;  Surgeon: Larina Earthly, MD;  Location: [facility] OR;  Service: Vascular;  Laterality: Right;   IR FLUORO GUIDE CV LINE RIGHT  09/22/2020   IR THORACENTESIS ASP PLEURAL SPACE W/IMG GUIDE  05/19/2019   IR US GUIDE VASC ACCESS LEFT  09/22/2020   IR US GUIDE VASC ACCESS RIGHT  09/22/2020   IR VENOCAVAGRAM SVC  09/22/2020   LAMINECTOMY  05/27/2009   Lumbar decompressive laminectomy, fusion and plating for lumbar spinal stensosis   LIGATION OF ARTERIOVENOUS  FISTULA Left 09/24/2020   Procedure: LIGATION OF LEFT ARM ARTERIOVENOUS  FISTULA;  Surgeon: Larina Earthly, MD;  Location: [facility] OR;  Service: Vascular;  Laterality: Left;   LUMBAR LAMINECTOMY/DECOMPRESSION MICRODISCECTOMY Left 03/23/2013   Procedure: LUMBAR LAMINECTOMY/DECOMPRESSION MICRODISCECTOMY 1 LEVEL;  Surgeon: Tia Alert, MD;  Location: [facility];  Service: Neurosurgery;  Laterality: Left;  LUMBAR LAMINECTOMY/DECOMPRESSION MICRODISCECTOMY 1 LEVEL   MASTECTOMY, PARTIAL Left 02/26/2003   ; S/P re-excision of cranial and lateral margins 04/19/2003.    RE-EXCISION OF BREAST CANCER,SUPERIOR MARGINS Right 10/27/2012   Procedure: RE-EXCISION OF BREAST CANCER,SUPERIOR and inferior MARGINS;  Surgeon: Robyne Askew, MD;  Location: [facility] OR;  Service: General;  Laterality: Right;   RE-EXCISION OF BREAST LUMPECTOMY Left 04/2003   TEE WITHOUT CARDIOVERSION N/A 04/04/2019   Procedure: Transesophageal Echocardiogram (Tee);  Surgeon: Linden Dolin, MD;  Location: [facility] OR;  Service: Open Heart Surgery;  Laterality: N/A;   THORACIC AORTIC ANEURYSM  REPAIR N/A 04/04/2019   Procedure: THORACIC ASCENDING ANEURYSM REPAIR (AAA)  USING 28 MM X 30 CM HEMASHIELD PLATINUM VASCULAR GRAFT;  Surgeon: Linden Dolin, MD;  Location: [facility] OR;  Service: Open Heart Surgery;  Laterality: N/A;    Family History  Problem Relation Age of Onset   Colon cancer Mother 40   Hypertension Mother    Diabetes Sister 35   Hypertension Sister    Diabetes Brother    Hypertension Brother    Diabetes Brother    Hypertension Brother    Kidney disease Son        s/p renal transplant   Hypertension Son    Diabetes Son    Multiple sclerosis Son    Bone cancer Sister 58   Breast cancer Neg Hx    Cervical cancer Neg Hx     Social History:  reports that she quit smoking about 2 years ago. Her smoking use included cigarettes. She started smoking about 46 years ago. She has a 11 pack-year smoking history. She has never used smokeless tobacco. She reports that she does not currently use alcohol after a past usage of about 2.0 standard drinks of alcohol per week. She reports that she  does not currently use drugs after having used the following drugs: Cocaine.  Allergies:  Allergies  Allergen Reactions   Shrimp [Shellfish Allergy] Shortness Of Breath   Bactroban [Mupirocin] Other (See Comments)    "Sores in nose"   Hydrocodone Itching, Nausea Only and Nausea And Vomiting   Chlorhexidine Gluconate Itching   Tylenol [Acetaminophen] Itching    Takes Percocet at home 05/06/23   Zestril [Lisinopril] Cough    Medications: I have reviewed the patient's current medications.   Results for orders placed or performed during the hospital encounter of 05/06/23 (from the past 48 hour(s))  Comprehensive metabolic panel     Status: Abnormal   Collection Time: 05/06/23  3:32 AM  Result Value Ref Range   Sodium 137 135 - 145 mmol/L   Potassium 5.8 (H) 3.5 - 5.1 mmol/L   Chloride 100 98 - 111 mmol/L   CO2 17 (L) 22 - 32 mmol/L   Glucose, Bld 128 (H) 70 - 99 mg/dL    Comment:  Glucose reference range applies only to samples taken after fasting for at least 8 hours.   BUN 82 (H) 8 - 23 mg/dL   Creatinine, Ser 08.65 (H) 0.44 - 1.00 mg/dL   Calcium 9.2 8.9 - 78.4 mg/dL   Total Protein 7.5 6.5 - 8.1 g/dL   Albumin 4.0 3.5 - 5.0 g/dL   AST 17 15 - 41 U/L   ALT 10 0 - 44 U/L   Alkaline Phosphatase 79 38 - 126 U/L   Total Bilirubin 0.8 <1.2 mg/dL   GFR, Estimated 2 (L) >60 mL/min    Comment: (NOTE) Calculated using the CKD-EPI Creatinine Equation (2021)    Anion gap 20 (H) 5 - 15    Comment: Performed at [facility], [address] [place], [place] [address]  CBC     Status: Abnormal   Collection Time: 05/06/23  3:32 AM  Result Value Ref Range   WBC 6.8 4.0 - 10.5 K/uL   RBC 4.29 3.87 - 5.11 MIL/uL   Hemoglobin 13.0 12.0 - 15.0 g/dL   HCT 52.8 41.3 - 24.4 %   MCV 97.7 80.0 - 100.0 fL   MCH 30.3 26.0 - 34.0 pg   MCHC 31.0 30.0 - 36.0 g/dL   RDW 01.0 (H) 27.2 - 53.6 %   Platelets 296 150 - 400 K/uL   nRBC 0.3 (H) 0.0 - 0.2 %    Comment: Performed at [facility], [address] [place], [place] [address]  Troponin I (High Sensitivity)     Status: Abnormal   Collection Time: 05/06/23  3:32 AM  Result Value Ref Range   Troponin I (High Sensitivity) 75 (H) <18 ng/L    Comment: (NOTE) Elevated high sensitivity troponin I (hsTnI) values and significant  changes across serial measurements may suggest ACS but many other  chronic and acute conditions are known to elevate hsTnI results.  Refer to the "Links" section for chest pain algorithms and additional  guidance. Performed at [facility], [address], [place], [place] [address]   Brain natriuretic peptide     Status: Abnormal   Collection Time: 05/06/23  3:33 AM  Result Value Ref Range   B Natriuretic Peptide >4,500.0 (H) 0.0 - 100.0 pg/mL    Comment: Performed at [facility], [address] [place], [place] [address]  Resp panel by RT-PCR (RSV, Flu A&B, Covid) Anterior  Nasal Swab     Status:  None   Collection Time: 05/06/23  4:00 AM   Specimen: Anterior Nasal Swab  Result Value Ref Range   SARS Coronavirus 2 by RT PCR NEGATIVE NEGATIVE   Influenza A by PCR NEGATIVE NEGATIVE   Influenza B by PCR NEGATIVE NEGATIVE    Comment: (NOTE) The Xpert Xpress SARS-CoV-2/FLU/RSV plus assay is intended as an aid in the diagnosis of influenza from Nasopharyngeal swab specimens and should not be used as a sole basis for treatment. Nasal washings and aspirates are unacceptable for Xpert Xpress SARS-CoV-2/FLU/RSV testing.  Fact Sheet for Patients: BloggerCourse.com  Fact Sheet for Healthcare Providers: SeriousBroker.it  This test is not yet approved or cleared by the [place] FDA and has been authorized for detection and/or diagnosis of SARS-CoV-2 by FDA under an Emergency Use Authorization (EUA). This EUA will remain in effect (meaning this test can be used) for the duration of the COVID-19 declaration under Section 564(b)(1) of the Act, 21 U.S.C. section 360bbb-3(b)(1), unless the authorization is terminated or revoked.     Resp Syncytial Virus by PCR NEGATIVE NEGATIVE    Comment: (NOTE) Fact Sheet for Patients: BloggerCourse.com  Fact Sheet for Healthcare Providers: SeriousBroker.it  This test is not yet approved or cleared by the [place] FDA and has been authorized for detection and/or diagnosis of SARS-CoV-2 by FDA under an Emergency Use Authorization (EUA). This EUA will remain in effect (meaning this test can be used) for the duration of the COVID-19 declaration under Section 564(b)(1) of the Act, 21 U.S.C. section 360bbb-3(b)(1), unless the authorization is terminated or revoked.  Performed at [facility], [address] [place], [place] [address]   Troponin I (High Sensitivity)     Status: Abnormal   Collection Time: 05/06/23   5:53 AM  Result Value Ref Range   Troponin I (High Sensitivity) 73 (H) <18 ng/L    Comment: (NOTE) Elevated high sensitivity troponin I (hsTnI) values and significant  changes across serial measurements may suggest ACS but many other  chronic and acute conditions are known to elevate hsTnI results.  Refer to the "Links" section for chest pain algorithms and additional  guidance. Performed at [facility], [address] [place], [place] [address]   CBC     Status: Abnormal   Collection Time: 05/06/23  5:53 AM  Result Value Ref Range   WBC 7.7 4.0 - 10.5 K/uL   RBC 3.97 3.87 - 5.11 MIL/uL   Hemoglobin 12.0 12.0 - 15.0 g/dL   HCT 09.8 11.9 - 14.7 %   MCV 98.5 80.0 - 100.0 fL   MCH 30.2 26.0 - 34.0 pg   MCHC 30.7 30.0 - 36.0 g/dL   RDW 82.9 (H) 56.2 - 13.0 %   Platelets 276 150 - 400 K/uL   nRBC 0.3 (H) 0.0 - 0.2 %    Comment: Performed at [facility], [address], [place], [place] [address]  Comprehensive metabolic panel     Status: Abnormal   Collection Time: 05/06/23  5:53 AM  Result Value Ref Range   Sodium 138 135 - 145 mmol/L   Potassium 6.0 (H) 3.5 - 5.1 mmol/L   Chloride 101 98 - 111 mmol/L   CO2 18 (L) 22 - 32 mmol/L   Glucose, Bld 135 (H) 70 - 99 mg/dL    Comment: Glucose reference range applies only to samples taken after fasting for at least 8 hours.   BUN 83 (H) 8 - 23 mg/dL  Creatinine, Ser 14.71 (H) 0.44 - 1.00 mg/dL   Calcium 8.9 8.9 - 65.7 mg/dL   Total Protein 7.1 6.5 - 8.1 g/dL   Albumin 3.8 3.5 - 5.0 g/dL   AST 14 (L) 15 - 41 U/L   ALT 9 0 - 44 U/L   Alkaline Phosphatase 78 38 - 126 U/L   Total Bilirubin 0.8 <1.2 mg/dL   GFR, Estimated 2 (L) >60 mL/min    Comment: (NOTE) Calculated using the CKD-EPI Creatinine Equation (2021)    Anion gap 19 (H) 5 - 15    Comment: Performed at [facility], [address] [place], [place] [address]   *Note: Due to a large number of results and/or encounters for the requested time period,  some results have not been displayed. A complete set of results can be found in Results Review.    DG Chest Port 1 View  Result Date: 05/06/2023 CLINICAL DATA:  72 year old female with shortness of breath. EXAM: PORTABLE CHEST 1 VIEW COMPARISON:  Chest radiographs 04/02/2023 and earlier. FINDINGS: Portable AP semi upright view at 0337 hours. Stable right chest dual lumen dialysis type catheter. Stable cardiomegaly, Calcified aortic atherosclerosis., mediastinal contours. Previous sternotomy. Stable lung volumes. Pulmonary vascular congestion diffusely, with minimally improved right lung base ventilation since last month. Stable patchy left lung base opacity. No superimposed pneumothorax. No definite pleural effusion. IMPRESSION: Cardiomegaly and dialysis chest catheter with ongoing pulmonary interstitial edema. Patchy lung base atelectasis. No definite pleural effusion. Electronically Signed   By: Odessa Fleming M.D.   On: 05/06/2023 05:00    ROS: As per H&P, rest of systems reviewed and negative. Blood pressure (!) 176/96, pulse 67, temperature 97.7 F (36.5 C), resp. rate 17, SpO2 99%. Gen: NAD, comfortable Respiratory: Clear bilateral, no wheezing or crackle Cardiovascular: Regular rate rhythm S1-S2 normal, no rubs GI: Abdomen soft, nontender, nondistended Extremities, no cyanosis or clubbing, no edema Skin: No rash or ulcer Neurology: Alert, awake, following commands, oriented Dialysis Access: TDC in place.  Assessment/Plan:  # Acute hypoxic respiratory failure due to missed HD treatment and volume overload.  Potassium level elevated.  Urgent dialysis to the morning.  UF as tolerated, around 4 L.  He is due for the access.  # ESRD: Continue TTS schedule.  HD today and next dialysis would be on Saturday.  This can be done as outpatient if patient is discharged.  # Hyperkalemia due to missing treatment: HD today.  Continue renal, low potassium diet.  # Hypertension: BP elevated with pulm edema.   UF during HD, resume home medication.  Monitor BP.  # Anemia of ESRD: Hemoglobin above goal, received ESA as outpatient.  # CKD-metabolic Bone Disease: Continue Hectorol and Sensipar if she remains in the hospital.  Monitor lab.  Thank you for the consult, we will continue to follow.  Tammela Bales Jaynie Collins 05/06/2023, 11:07 AM

## 2023-05-06 NOTE — Progress Notes (Signed)
Patient brought to 4E from ED. VSS. Telemetry box applied, CCMD notified. Patient oriented to staff and room. Call bell in reach.  Kenard Gower, RN

## 2023-05-06 NOTE — Progress Notes (Signed)
   05/06/23 1345  Vitals  Temp 97.6 F (36.4 C)  Pulse Rate 94  Resp 18  BP (!) 159/97  SpO2 100 %  O2 Device Nasal Cannula  Oxygen Therapy  O2 Flow Rate (L/min) 2 L/min  Post Treatment  Dialyzer Clearance Lightly streaked  Hemodialysis Intake (mL) 100 mL  Liters Processed 61.1  Fluid Removed (mL) 3100 mL  Tolerated HD Treatment Yes  Post-Hemodialysis Comments terminated tx 16 mins early due to severe cramping despite ufg decrease and uf paused   Received patient in bed to unit.  Alert and oriented.  Informed consent signed and in chart.   TX duration:3hrs  Patient tolerated well.  Transported back to the room  Alert, without acute distress.  Hand-off given to patient's nurse.   Access used: [facility] Access issues: none  Total UF removed: 3.1L due to cramping Medication(s) given: none    [facility]

## 2023-05-06 NOTE — ED Triage Notes (Signed)
Brought in via EMS, was 86% room air. Woke up with shortness of breath about 30 minutes ago. She is dialysis, she is compliant. She is supposed to have it today. No injuries or recent injuries. Notices some weight gain. She is usually not hypertensive and doesn't wear O2. Hx of stroke, right sided deficits.

## 2023-05-06 NOTE — ED Notes (Signed)
Dialysis too off 3.1L bc she started cramping so dialysis took her off soon.

## 2023-05-06 NOTE — ED Notes (Signed)
Called floor to notify patient is coming to the floor

## 2023-05-07 ENCOUNTER — Encounter (HOSPITAL_COMMUNITY): Payer: Self-pay | Admitting: Internal Medicine

## 2023-05-07 DIAGNOSIS — I5023 Acute on chronic systolic (congestive) heart failure: Secondary | ICD-10-CM

## 2023-05-07 DIAGNOSIS — Z91199 Patient's noncompliance with other medical treatment and regimen due to unspecified reason: Secondary | ICD-10-CM

## 2023-05-07 DIAGNOSIS — N289 Disorder of kidney and ureter, unspecified: Secondary | ICD-10-CM

## 2023-05-07 DIAGNOSIS — E875 Hyperkalemia: Secondary | ICD-10-CM

## 2023-05-07 DIAGNOSIS — I161 Hypertensive emergency: Secondary | ICD-10-CM

## 2023-05-07 DIAGNOSIS — E8770 Fluid overload, unspecified: Secondary | ICD-10-CM | POA: Diagnosis not present

## 2023-05-07 DIAGNOSIS — J9601 Acute respiratory failure with hypoxia: Secondary | ICD-10-CM | POA: Diagnosis present

## 2023-05-07 DIAGNOSIS — Z8673 Personal history of transient ischemic attack (TIA), and cerebral infarction without residual deficits: Secondary | ICD-10-CM

## 2023-05-07 LAB — HEPATITIS B SURFACE ANTIBODY, QUANTITATIVE: Hep B S AB Quant (Post): 44.3 m[IU]/mL

## 2023-05-07 NOTE — Progress Notes (Signed)
Patient given discharge instructions medication list and follow up appointments. All questions answered. Will be transported to exit via wheel chair and hospital staff. Mechell Girgis, Randall An rN

## 2023-05-07 NOTE — Assessment & Plan Note (Signed)
05-07-2023 stable. On eliquis.

## 2023-05-07 NOTE — Progress Notes (Signed)
Order received to discharge patient.  Telemetry monitor removed and CCMD notified.  PIV access removed without difficulty.  Discharge instructions, follow up, medications and instructions for their use discussed with patient. °

## 2023-05-07 NOTE — Assessment & Plan Note (Signed)
05-07-2023 chronic.

## 2023-05-07 NOTE — Assessment & Plan Note (Signed)
05-07-2023 chronic. Due to prior CVA.

## 2023-05-07 NOTE — Assessment & Plan Note (Signed)
05-07-2023 Continue with home meds of imdur 30 mg every day, hydralazine 100 mg tid, clonidine 0.2 mg bid, coreg 12.5 mg bid, novasc 10 mg qday.Marland Kitchen

## 2023-05-07 NOTE — Assessment & Plan Note (Signed)
05-07-2023 last echo 06-2022. LVEF 45 to 50%. Her acute exacerbation of chronic systolic CHF due to volume overload from missed HD and dietary/fluid indiscretion. Stable for DC to home. Unfortunately, her admission weight was not recorded in ER. DC weight 106.48 lbs.  Weight Information (since admission)     Date/Time Weight Weight in lbs BSA (Calculated - sq m) BMI (Calculated) Who   05/07/23 0629 48.3 kg 106.48 lbs -- 18.87 SM

## 2023-05-07 NOTE — Progress Notes (Signed)
D/C order noted. Contacted [facility] to advise clinic of pt's d/c today and that pt should resume care tomorrow.   Olivia Canter Renal Navigator [phone]

## 2023-05-07 NOTE — Assessment & Plan Note (Addendum)
05-07-2023 admitted for volume overload due to missed HD and dietary/fluid indiscretion. Had HD that removed 3.1 liters of fluid. BP controlled and pt weaned to RA. Pt states she has all the home meds she needs and does not need refills. Pt states she will go to HD tomorrow as scheduled. Nephrology has cleared her for DC today.

## 2023-05-07 NOTE — Assessment & Plan Note (Signed)
05-07-2023 resolved with HD.

## 2023-05-07 NOTE — Progress Notes (Signed)
Pt receives out-pt HD at [facility] [place] on [facility]TS. Will assist as needed.   Olivia Canter Renal Navigator [phone]

## 2023-05-07 NOTE — Care Management CC44 (Signed)
Condition Code 44 Documentation Completed  Patient Details  Name: Monique Henderson MRN: [id number] Date of Birth: 07/14/1950   Condition Code 44 given:  Yes Patient signature on Condition Code 44 notice:  Yes Documentation of 2 MD's agreement:  Yes Code 44 added to claim:  Yes    Eduard Roux, LCSW 05/07/2023, 12:40 PM

## 2023-05-07 NOTE — Assessment & Plan Note (Signed)
05-07-2023 resolved with HD. Pt on RA.

## 2023-05-07 NOTE — Hospital Course (Signed)
HPI: Monique Henderson  is a 72 y.o. female, with H/O ESRD, MWF via R clavian HD catheter, paroxysmal A-fib on Eliquis, hypertension and poor control, COPD, history of right-sided breast cancer s/p lumpectomy in 2014, GERD, hep C treated in 2018, moderate protein calorie malnutrition, history of stroke with mild right-sided deficits, chronic low back pain on narcotics, s/p lumbar spinal fusion surgery in 2010 by Dr. Yetta Barre, who presents to the hospital with shortness of breath starting 12 to 14 hours, worse this morning upon waking up.  In the ER found to be in mild pulmonary edema due to fluid overload, blood pressure in poor control due to the respiratory distress, she was placed on 2 L oxygen with much improvement.  Hospitalist team was called to admit the patient.   Currently after application of oxygen patient is feeling a lot better, mild generalized headache, no chest or abdominal pain, shortness of breath has considerably improved, no orthopnea, no abdominal pain, no diarrhea or dysuria, no blood in stool or urine, she does have mild right arm weakness and facial droop from previous stroke but no new focal weakness.  She does have mild dysarthria from previous stroke as well.  Significant Events: Admitted 05/06/2023 for acute respiratory failure with hypoxia, volume overload due to dietary/fluid non-compliance, acute on chronic diastolic CHF   Significant Labs: Admission Na 137, K 5.8, BUN 82, Scr 14.57  Significant Imaging Studies: Admission CXR shows Cardiomegaly and dialysis chest catheter with ongoing pulmonary interstitial edema. Patchy lung base atelectasis. No definite pleural effusion  Antibiotic Therapy: Anti-infectives (From admission, onward)    None       Procedures:   Consultants: nephrology

## 2023-05-07 NOTE — Assessment & Plan Note (Signed)
05-07-2023 on eliquis.

## 2023-05-07 NOTE — Assessment & Plan Note (Signed)
05-07-2023 pt is anuric. Unable to provide urine sample. In march 2023, pt tested positive for cocaine.

## 2023-05-07 NOTE — Assessment & Plan Note (Signed)
05-07-2023 pt with missed HD session and dietary indiscretion.

## 2023-05-07 NOTE — Assessment & Plan Note (Signed)
05-07-2023 due to missed HD and dietary/fluid indiscretion. Continue with home meds of imdur 30 mg every day, hydralazine 100 mg tid, clonidine 0.2 mg bid, coreg 12.5 mg bid, novasc 10 mg qday.Monique Henderson

## 2023-05-07 NOTE — Progress Notes (Signed)
Mobility Specialist Progress Note:   05/07/23 1000  Mobility  Activity Dangled on edge of bed;Refused mobility  Activity Response Tolerated well  Mobility Referral Yes  Mobility visit 1 Mobility  Mobility Specialist Start Time (ACUTE ONLY) 1005  Mobility Specialist Stop Time (ACUTE ONLY) 1012  Mobility Specialist Time Calculation (min) (ACUTE ONLY) 7 min    Pt refused mobility d/t ambulating earlier this morning. Will f/u as able.    Monique Henderson  Mobility Specialist Please contact via [employer] office at [phone]

## 2023-05-07 NOTE — Discharge Planning (Signed)
[facility] Patient Discharge Orders- [facility] CLINIC: [facility]  Patient's name: Monique Henderson Admit/DC Dates: 05/06/2023 - 05/07/2023  Discharge Diagnoses: Acute respiratory failure with hypoxia 2nd volume overload, resolved with HD  HTN Emergency R-sided hemiparesis 2nd hx CVA   Aranesp: Given: No   Last Hgb: 12 PRBC's Given: No  ESA dose for discharge: Hold Micera for now as last Hgb here was 12. Resume Micera per protocol if Hgb drops below 10. IV Iron dose at discharge: Hold for now.   Heparin change: No  EDW Change: No  Bath Change: No  Access intervention/Change: No  Hectorol change: No  Discharge Labs: Calcium 8.9 Phosphorus N/A Albumin 3.8 K+ 6.0 from 12/5 (pre-HD)  IV Antibiotics: No  On Coumadin?: No, no Eliquis   OTHER/APPTS/LAB ORDERS: Re-check another K+ level at HD tomorrow 05/08/23. Also draw iron panel at next HD on 05/08/23    D/C Meds to be reconciled by nurse after every discharge.  Completed By: Salome Holmes, NP   Reviewed by: MD:______ RN_______

## 2023-05-07 NOTE — Progress Notes (Signed)
[facility] Progress Note   Subjective:    Seen and examined patient at bedside. She reports feeling much better and ready to go home. Tolerated yesterday's HD with net UF 3.1L. Plan for discharge today.  Objective Vitals:   05/07/23 0350 05/07/23 0629 05/07/23 0740 05/07/23 1108  BP: (!) 151/102  (!) 168/121 (!) 138/92  Pulse: 80  85 80  Resp: 19 18 19    Temp: 98.4 F (36.9 C)  97.6 F (36.4 C) 98.1 F (36.7 C)  TempSrc: Oral  Oral Oral  SpO2: 93%  100% 96%  Weight:  48.3 kg     Physical Exam General: Chronically-ill appearing; NAD; on RA Heart: S1 and S2; No MRGs Lungs: CTA; No wheezing, rales, or rhonchi Abdomen: Soft and non-tender Extremities: No LE edema Dialysis Access: [facility]   Filed Weights   05/07/23 0629  Weight: 48.3 kg    Intake/Output Summary (Last 24 hours) at 05/07/2023 1207 Last data filed at 05/06/2023 2200 Gross per 24 hour  Intake 120 ml  Output 3100 ml  Net -2980 ml    Additional Objective Labs: Basic Metabolic Panel: Recent Labs  Lab 05/06/23 0332 05/06/23 0553  NA 137 138  K 5.8* 6.0*  CL 100 101  CO2 17* 18*  GLUCOSE 128* 135*  BUN 82* 83*  CREATININE 14.57* 14.71*  CALCIUM 9.2 8.9   Liver Function Tests: Recent Labs  Lab 05/06/23 0332 05/06/23 0553  AST 17 14*  ALT 10 9  ALKPHOS 79 78  BILITOT 0.8 0.8  PROT 7.5 7.1  ALBUMIN 4.0 3.8   No results for input(s): "LIPASE", "AMYLASE" in the last 168 hours. CBC: Recent Labs  Lab 05/06/23 0332 05/06/23 0553  WBC 6.8 7.7  HGB 13.0 12.0  HCT 41.9 39.1  MCV 97.7 98.5  PLT 296 276   Blood Culture    Component Value Date/Time   SDES BLOOD LEFT HAND 09/25/2022 0319   SDES BLOOD LEFT ARM 09/25/2022 0319   SPECREQUEST  09/25/2022 0319    BOTTLES DRAWN AEROBIC AND ANAEROBIC Blood Culture adequate volume   SPECREQUEST  09/25/2022 0319    BOTTLES DRAWN AEROBIC AND ANAEROBIC Blood Culture adequate volume   CULT  09/25/2022 0319    NO GROWTH 5 DAYS Performed  at [facility] Lab, [address], [place], [place] [address]    CULT  09/25/2022 0319    NO GROWTH 5 DAYS Performed at [facility], [address], [place], [place] [address]    REPTSTATUS 09/30/2022 FINAL 09/25/2022 0319   REPTSTATUS 09/30/2022 FINAL 09/25/2022 0319    Cardiac Enzymes: No results for input(s): "CKTOTAL", "CKMB", "CKMBINDEX", "TROPONINI" in the last 168 hours. CBG: No results for input(s): "GLUCAP" in the last 168 hours. Iron Studies: No results for input(s): "IRON", "TIBC", "TRANSFERRIN", "FERRITIN" in the last 72 hours. Lab Results  Component Value Date   INR 1.2 09/25/2022   INR 1.0 02/23/2022   INR 1.1 01/29/2022   Studies/Results: DG Chest Port 1 View  Result Date: 05/06/2023 CLINICAL DATA:  72 year old female with shortness of breath. EXAM: PORTABLE CHEST 1 VIEW COMPARISON:  Chest radiographs 04/02/2023 and earlier. FINDINGS: Portable AP semi upright view at 0337 hours. Stable right chest dual lumen dialysis type catheter. Stable cardiomegaly, Calcified aortic atherosclerosis., mediastinal contours. Previous sternotomy. Stable lung volumes. Pulmonary vascular congestion diffusely, with minimally improved right lung base ventilation since last month. Stable patchy left lung base opacity. No superimposed pneumothorax. No definite pleural effusion. IMPRESSION: Cardiomegaly  and dialysis chest catheter with ongoing pulmonary interstitial edema. Patchy lung base atelectasis. No definite pleural effusion. Electronically Signed   By: Odessa Fleming M.D.   On: 05/06/2023 05:00    Medications:   amLODipine  10 mg Oral Daily   apixaban  2.5 mg Oral BID   carvedilol  12.5 mg Oral BID WC   cloNIDine  0.2 mg Oral BID   hydrALAZINE  100 mg Oral Q8H   isosorbide mononitrate  30 mg Oral Daily   pantoprazole  40 mg Oral Daily   rosuvastatin  10 mg Oral Daily   sevelamer carbonate  2.4 g Oral TID WC    Dialysis Orders: SW [facility], TTS, 3.5 hr  EDW 44.9 kg, 2k2ca, RIJ  TDC. Mircera 225 mcg on 11/25 Hectorol 9 mcg three times per week  Sensipar 60 mg  Assessment/Plan: # Acute hypoxic respiratory failure due to missed HD treatment and volume overload.  Potassium level elevated. S/p HD 12/5 and 3.1L was removed.  UF as tolerated, around 4 L.  He is due for the access.   # ESRD: Continue TTS schedule. Will resume HD tomorrow in outpatient. She is being discharged today.   # Hyperkalemia due to missing treatment, received HD yesterday.  Continue renal, low potassium diet. Will re-check another K+ level tomorrow in outpatient.   # Hypertension: BP elevated with pulm edema.  UF during HD, resume home medication.  Monitor BP.   # Anemia of ESRD: Hemoglobin above goal, received ESA as outpatient.   # CKD-metabolic Bone Disease: Continue Hectorol and Sensipar if she remains in the hospital.  Monitor lab.  #Dispo - Okay for discharge from a renal standpoint.   Monique Holmes, NP [facility] 05/07/2023,12:07 PM  LOS: 1 day

## 2023-05-07 NOTE — Care Management Obs Status (Signed)
MEDICARE OBSERVATION STATUS NOTIFICATION   Patient Details  Name: AANVI YANCE MRN: [id number] Date of Birth: 04-30-1951   Medicare Observation Status Notification Given:  Yes    Eduard Roux, LCSW 05/07/2023, 12:40 PM

## 2023-05-07 NOTE — Discharge Summary (Signed)
[facility] Phys[facility] Discharge Summary   Patient name: Monique Henderson  Admit date:     05/06/2023  Discharge date: 05/07/2023  Attending Physician: Leroy Sea [[id number]]  Discharge Physician: Carollee Herter   PCP: Fleet Contras, MD  Admitted From: Home  Disposition:  Home  Recommendations for Outpatient Follow-up:  Follow up with PCP in 1-2 weeks Follow up with regularly scheduled hemodialysis on Tuesday, Thursday, Saturday  Home Health:No Equipment/Devices: None  Discharge Condition:Stable CODE STATUS:FULL Diet recommendation: Renal Fluid Restriction: 1800 ml/day  Hospital Summary: HPI: Tamalyn Melka  is a 72 y.o. female, with H/O ESRD, MWF via R clavian HD catheter, paroxysmal A-fib on Eliquis, hypertension and poor control, COPD, history of right-sided breast cancer s/p lumpectomy in 2014, GERD, hep C treated in 2018, moderate protein calorie malnutrition, history of stroke with mild right-sided deficits, chronic low back pain on narcotics, s/p lumbar spinal fusion surgery in 2010 by Dr. Yetta Barre, who presents to the hospital with shortness of breath starting 12 to 14 hours, worse this morning upon waking up.  In the ER found to be in mild pulmonary edema due to fluid overload, blood pressure in poor control due to the respiratory distress, she was placed on 2 L oxygen with much improvement.  Hospitalist team was called to admit the patient.   Currently after application of oxygen patient is feeling a lot better, mild generalized headache, no chest or abdominal pain, shortness of breath has considerably improved, no orthopnea, no abdominal pain, no diarrhea or dysuria, no blood in stool or urine, she does have mild right arm weakness and facial droop from previous stroke but no new focal weakness.  She does have mild dysarthria from previous stroke as well.  Significant Events: Admitted 05/06/2023 for acute respiratory failure with hypoxia, volume overload due to  dietary/fluid non-compliance, acute on chronic diastolic CHF   Significant Labs: Admission Na 137, K 5.8, BUN 82, Scr 14.57  Significant Imaging Studies: Admission CXR shows Cardiomegaly and dialysis chest catheter with ongoing pulmonary interstitial edema. Patchy lung base atelectasis. No definite pleural effusion  Antibiotic Therapy: Anti-infectives (From admission, onward)    None       Procedures:   Consultants: nephrology   Hospital Course by Problem: * Fluid overload 05-07-2023 admitted for volume overload due to missed HD and dietary/fluid indiscretion. Had HD that removed 3.1 liters of fluid. BP controlled and pt weaned to RA. Pt states she has all the home meds she needs and does not need refills. Pt states she will go to HD tomorrow as scheduled. Nephrology has cleared her for DC today.  Acute hypoxemic respiratory failure (HCC) 05-07-2023 resolved with HD. Pt on RA.  Hypervolemia associated with renal insufficiency 05-07-2023 resolved with HD.  Acute on chronic HFrEF (heart failure with reduced ejection fraction) (HCC) - BNP does NOT correlate to her level of volume overload 05-07-2023 last echo 06-2022. LVEF 45 to 50%. Her acute exacerbation of chronic systolic CHF due to volume overload from missed HD and dietary/fluid indiscretion. Stable for DC to home. Unfortunately, her admission weight was not recorded in ER. DC weight 106.48 lbs.  Weight Information (since admission)     Date/Time Weight Weight in lbs BSA (Calculated - sq m) BMI (Calculated) Who   05/07/23 0629 48.3 kg 106.48 lbs -- 18.87 SM        Hypertensive emergency 05-07-2023 due to missed HD and dietary/fluid indiscretion. Continue with home meds of imdur 30 mg every day, hydralazine 100 mg  tid, clonidine 0.2 mg bid, coreg 12.5 mg bid, novasc 10 mg qday..  Hyperkalemia 05-07-2023 resolved with HD  Hemiparesis affecting right side as late effect of cerebrovascular accident (CVA) (HCC) 05-07-2023  chronic. Due to prior CVA.  Polysubstance abuse (HCC) 05-07-2023 pt is anuric. Unable to provide urine sample. In march 2023, pt tested positive for cocaine.  Hypertension 05-07-2023 Continue with home meds of imdur 30 mg every day, hydralazine 100 mg tid, clonidine 0.2 mg bid, coreg 12.5 mg bid, novasc 10 mg qday..  Paroxysmal atrial fibrillation (HCC) 05-07-2023 on eliquis.  History of CVA (cerebrovascular accident) 05-07-2023 stable. On eliquis.  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 05-07-2023 chronic.  Non compliance with medical treatment 05-07-2023 pt with missed HD session and dietary indiscretion.    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   History of CVA (cerebrovascular accident)   Paroxysmal atrial fibrillation (HCC)   Hypertension   Polysubstance abuse (HCC)   Hemiparesis affecting right side as late effect of cerebrovascular accident (CVA) [facility])   Discharge Instructions  Discharge Instructions     (HEART FAILURE PATIENTS) Call MD:  Anytime you have any of the following symptoms: 1) 3 pound weight gain in 24 hours or 5 pounds in 1 week 2) shortness of breath, with or without a dry hacking cough 3) swelling in the hands, feet or stomach 4) if you have to sleep on extra pillows at night in order to breathe.   Complete by: As directed    Call MD for:  difficulty breathing, headache or visual disturbances   Complete by: As directed    Call MD for:  persistant dizziness or light-headedness   Complete by: As directed    Call MD for:  persistant nausea and vomiting   Complete by: As  directed    Call MD for:  severe uncontrolled pain   Complete by: As directed    Call MD for:  temperature >100.4   Complete by: As directed    Diet renal with fluid restriction   Complete by: As directed    60 ounces per day fluid restriction 2000 mg per day salt restriction   Discharge instructions   Complete by: As directed    1. Go to your regularly scheduled hemodialysis session tomorrow(Saturday). Continue with your regular dialysis schedule on Tuesday, Thursday and Saturday 2. You are on a 60 ounces per day fluid restriction and a 2000 milligram salt per day salt restriction.   Increase activity slowly   Complete by: As directed       Allergies as of 05/07/2023       Reactions   Shrimp [shellfish Allergy] Shortness Of Breath   Bactroban [mupirocin] Other (See Comments)   "Sores in nose"   Hydrocodone Itching, Nausea Only, Nausea And Vomiting   Chlorhexidine Gluconate Itching   Tylenol [acetaminophen] Itching   Takes Percocet at home 05/06/23   Zestril [lisinopril] Cough        Medication List     TAKE these medications    albuterol (2.5 MG/3ML) 0.083% nebulizer solution Commonly known as: PROVENTIL Take 2.5 mg by nebulization every  6 (six) hours as needed for wheezing or shortness of breath.   albuterol 108 (90 Base) MCG/ACT inhaler Commonly known as: VENTOLIN HFA Inhale 2 puffs into the lungs every 6 (six) hours as needed for wheezing or shortness of breath.   amLODipine 10 MG tablet Commonly known as: NORVASC Take 1 tablet (10 mg total) by mouth daily.   apixaban 2.5 MG Tabs tablet Commonly known as: ELIQUIS Take 1 tablet (2.5 mg total) by mouth 2 (two) times daily.   carvedilol 25 MG tablet Commonly known as: COREG Take 1 tablet (25 mg total) by mouth 2 (two) times daily with a meal.   cloNIDine 0.1 MG tablet Commonly known as: Catapres Take 1 tablet (0.1 mg total) by mouth 2 (two) times daily.   hydrALAZINE 100 MG tablet Commonly known as:  APRESOLINE Take 1 tablet (100 mg total) by mouth every 8 (eight) hours.   isosorbide mononitrate 30 MG 24 hr tablet Commonly known as: IMDUR TAKE 1 TABLET (30MG ) ON DIALYSIS DAYS AND 2 TABLETS (60 MG) ON NON DIALYSIS DAYS.   naloxone 4 MG/0.1ML Liqd nasal spray kit Commonly known as: NARCAN 1 spray as directed.   oxyCODONE-acetaminophen 10-325 MG tablet Commonly known as: PERCOCET Take 1 tablet by mouth in the morning and at bedtime.   pantoprazole 40 MG tablet Commonly known as: PROTONIX TAKE 1 TABLET (40 MG TOTAL) BY MOUTH 2 (TWO) TIMES DAILY.   rosuvastatin 10 MG tablet Commonly known as: CRESTOR TAKE 1 TABLET (10 MG TOTAL) BY MOUTH DAILY FOR CHOLESTEROL   senna-docusate 8.6-50 MG tablet Commonly known as: Senokot-S Take 1 tablet by mouth 2 (two) times daily between meals as needed for mild constipation. What changed: when to take this   sevelamer carbonate 2.4 g Pack Commonly known as: RENVELA Take 2.4 g by mouth 3 (three) times daily with meals.   traZODone 50 MG tablet Commonly known as: DESYREL Take 25-50 mg by mouth at bedtime.   VITAMIN D-3 PO Take 1 capsule by mouth daily.        Allergies  Allergen Reactions   Shrimp [Shellfish Allergy] Shortness Of Breath   Bactroban [Mupirocin] Other (See Comments)    "Sores in nose"   Hydrocodone Itching, Nausea Only and Nausea And Vomiting   Chlorhexidine Gluconate Itching   Tylenol [Acetaminophen] Itching    Takes Percocet at home 05/06/23   Zestril [Lisinopril] Cough    Discharge Exam: Vitals:   05/07/23 0740 05/07/23 1108  BP: (!) 168/121 (!) 138/92  Pulse: 85 80  Resp: 19   Temp: 97.6 F (36.4 C) 98.1 F (36.7 C)  SpO2: 100% 96%    Physical Exam Vitals and nursing note reviewed.  Constitutional:      General: She is not in acute distress.    Appearance: She is not toxic-appearing or diaphoretic.     Comments: Chronically ill appearing  HENT:     Head: Normocephalic and atraumatic.  Eyes:      General: No scleral icterus. Cardiovascular:     Rate and Rhythm: Normal rate and regular rhythm.  Pulmonary:     Effort: Pulmonary effort is normal. No respiratory distress.     Breath sounds: Normal breath sounds. No wheezing.  Abdominal:     General: Abdomen is flat. Bowel sounds are normal.     Palpations: Abdomen is soft.  Musculoskeletal:     Right lower leg: No edema.     Left lower leg: No edema.  Skin:    General:  Skin is warm and dry.  Neurological:     Mental Status: She is alert and oriented to person, place, and time.     Comments: +dysarthria Mild muscle weakness right side of her body     The results of significant diagnostics from this hospitalization (including imaging, microbiology, ancillary and laboratory) are listed below for reference.    Microbiology: Recent Results (from the past 240 hour(s))  Resp panel by RT-PCR (RSV, Flu A&B, Covid) Anterior Nasal Swab     Status: None   Collection Time: 05/06/23  4:00 AM   Specimen: Anterior Nasal Swab  Result Value Ref Range Status   SARS Coronavirus 2 by RT PCR NEGATIVE NEGATIVE Final   Influenza A by PCR NEGATIVE NEGATIVE Final   Influenza B by PCR NEGATIVE NEGATIVE Final    Comment: (NOTE) The Xpert Xpress SARS-CoV-2/FLU/RSV plus assay is intended as an aid in the diagnosis of influenza from Nasopharyngeal swab specimens and should not be used as a sole basis for treatment. Nasal washings and aspirates are unacceptable for Xpert Xpress SARS-CoV-2/FLU/RSV testing.  Fact Sheet for Patients: BloggerCourse.com  Fact Sheet for Healthcare Providers: SeriousBroker.it  This test is not yet approved or cleared by the [place] FDA and has been authorized for detection and/or diagnosis of SARS-CoV-2 by FDA under an Emergency Use Authorization (EUA). This EUA will remain in effect (meaning this test can be used) for the duration of the COVID-19 declaration  under Section 564(b)(1) of the Act, 21 U.S.C. section 360bbb-3(b)(1), unless the authorization is terminated or revoked.     Resp Syncytial Virus by PCR NEGATIVE NEGATIVE Final    Comment: (NOTE) Fact Sheet for Patients: BloggerCourse.com  Fact Sheet for Healthcare Providers: SeriousBroker.it  This test is not yet approved or cleared by the [place] FDA and has been authorized for detection and/or diagnosis of SARS-CoV-2 by FDA under an Emergency Use Authorization (EUA). This EUA will remain in effect (meaning this test can be used) for the duration of the COVID-19 declaration under Section 564(b)(1) of the Act, 21 U.S.C. section 360bbb-3(b)(1), unless the authorization is terminated or revoked.  Performed at [facility], [address], [place], [place] [address]      Labs: BNP (last 3 results) Recent Labs    01/27/23 2306 04/02/23 0208 05/06/23 0333  BNP >4,500.0* >4,500.0* >4,500.0*   Basic Metabolic Panel: Recent Labs  Lab 05/06/23 0332 05/06/23 0553  NA 137 138  K 5.8* 6.0*  CL 100 101  CO2 17* 18*  GLUCOSE 128* 135*  BUN 82* 83*  CREATININE 14.57* 14.71*  CALCIUM 9.2 8.9   Liver Function Tests: Recent Labs  Lab 05/06/23 0332 05/06/23 0553  AST 17 14*  ALT 10 9  ALKPHOS 79 78  BILITOT 0.8 0.8  PROT 7.5 7.1  ALBUMIN 4.0 3.8   CBC: Recent Labs  Lab 05/06/23 0332 05/06/23 0553  WBC 6.8 7.7  HGB 13.0 12.0  HCT 41.9 39.1  MCV 97.7 98.5  PLT 296 276   BNP: Recent Labs  Lab 05/06/23 0333  BNP >4,500.0*   Sepsis Labs Recent Labs  Lab 05/06/23 0332 05/06/23 0553  WBC 6.8 7.7   Microbiology Recent Results (from the past 240 hour(s))  Resp panel by RT-PCR (RSV, Flu A&B, Covid) Anterior Nasal Swab     Status: None   Collection Time: 05/06/23  4:00 AM   Specimen: Anterior Nasal Swab  Result Value Ref Range Status   SARS Coronavirus 2 by RT  PCR NEGATIVE NEGATIVE Final    Influenza A by PCR NEGATIVE NEGATIVE Final   Influenza B by PCR NEGATIVE NEGATIVE Final    Comment: (NOTE) The Xpert Xpress SARS-CoV-2/FLU/RSV plus assay is intended as an aid in the diagnosis of influenza from Nasopharyngeal swab specimens and should not be used as a sole basis for treatment. Nasal washings and aspirates are unacceptable for Xpert Xpress SARS-CoV-2/FLU/RSV testing.  Fact Sheet for Patients: BloggerCourse.com  Fact Sheet for Healthcare Providers: SeriousBroker.it  This test is not yet approved or cleared by the [place] FDA and has been authorized for detection and/or diagnosis of SARS-CoV-2 by FDA under an Emergency Use Authorization (EUA). This EUA will remain in effect (meaning this test can be used) for the duration of the COVID-19 declaration under Section 564(b)(1) of the Act, 21 U.S.C. section 360bbb-3(b)(1), unless the authorization is terminated or revoked.     Resp Syncytial Virus by PCR NEGATIVE NEGATIVE Final    Comment: (NOTE) Fact Sheet for Patients: BloggerCourse.com  Fact Sheet for Healthcare Providers: SeriousBroker.it  This test is not yet approved or cleared by the [place] FDA and has been authorized for detection and/or diagnosis of SARS-CoV-2 by FDA under an Emergency Use Authorization (EUA). This EUA will remain in effect (meaning this test can be used) for the duration of the COVID-19 declaration under Section 564(b)(1) of the Act, 21 U.S.C. section 360bbb-3(b)(1), unless the authorization is terminated or revoked.  Performed at [facility], [address], [place], [place] [address]     Procedures/Studies: DG Chest Port 1 View  Result Date: 05/06/2023 CLINICAL DATA:  72 year old female with shortness of breath. EXAM: PORTABLE CHEST 1 VIEW COMPARISON:  Chest radiographs 04/02/2023 and earlier. FINDINGS: Portable AP  semi upright view at 0337 hours. Stable right chest dual lumen dialysis type catheter. Stable cardiomegaly, Calcified aortic atherosclerosis., mediastinal contours. Previous sternotomy. Stable lung volumes. Pulmonary vascular congestion diffusely, with minimally improved right lung base ventilation since last month. Stable patchy left lung base opacity. No superimposed pneumothorax. No definite pleural effusion. IMPRESSION: Cardiomegaly and dialysis chest catheter with ongoing pulmonary interstitial edema. Patchy lung base atelectasis. No definite pleural effusion. Electronically Signed   By: Odessa Fleming M.D.   On: 05/06/2023 05:00    Time coordinating discharge: 40 mins  SIGNED:  Carollee Herter, DO [facility] 05/07/23, 11:54 AM

## 2023-05-07 NOTE — Subjective & Objective (Addendum)
Pt seen and examined. Had 3.1 liter of fluid removed with HD yesterday.  Goes to HD on T, Th. Sat Pt verifies she has all the home meds she needs. Does not need refills. Pt weaned to RA. BP controlled. Ready to go home. Pt states she will go to HD tomorrow as scheduled.

## 2023-05-07 NOTE — Progress Notes (Signed)
PROGRESS NOTE    CATALEIA NAWROT  JOA:[id number] DOB: Dec 24, 1950 DOA: 05/06/2023 PCP: Fleet Contras, MD  Subjective: Pt seen and examined. Had 3.1 liter of fluid removed with HD yesterday.  Goes to HD on T, Th. Sat Pt verifies she has all the home meds she needs. Does not need refills. Pt weaned to RA. BP controlled. Ready to go home. Pt states she will go to HD tomorrow as scheduled.   Hospital Course: HPI: Kyala Klepper  is a 72 y.o. female, with H/O ESRD, MWF via R clavian HD catheter, paroxysmal A-fib on Eliquis, hypertension and poor control, COPD, history of right-sided breast cancer s/p lumpectomy in 2014, GERD, hep C treated in 2018, moderate protein calorie malnutrition, history of stroke with mild right-sided deficits, chronic low back pain on narcotics, s/p lumbar spinal fusion surgery in 2010 by Dr. Yetta Barre, who presents to the hospital with shortness of breath starting 12 to 14 hours, worse this morning upon waking up.  In the ER found to be in mild pulmonary edema due to fluid overload, blood pressure in poor control due to the respiratory distress, she was placed on 2 L oxygen with much improvement.  Hospitalist team was called to admit the patient.   Currently after application of oxygen patient is feeling a lot better, mild generalized headache, no chest or abdominal pain, shortness of breath has considerably improved, no orthopnea, no abdominal pain, no diarrhea or dysuria, no blood in stool or urine, she does have mild right arm weakness and facial droop from previous stroke but no new focal weakness.  She does have mild dysarthria from previous stroke as well.  Significant Events: Admitted 05/06/2023 for acute respiratory failure with hypoxia, volume overload due to dietary/fluid non-compliance, acute on chronic diastolic CHF   Significant Labs: Admission Na 137, K 5.8, BUN 82, Scr 14.57  Significant Imaging Studies: Admission CXR shows Cardiomegaly and dialysis  chest catheter with ongoing pulmonary interstitial edema. Patchy lung base atelectasis. No definite pleural effusion  Antibiotic Therapy: Anti-infectives (From admission, onward)    None       Procedures:   Consultants: nephrology    Assessment and Plan: * Fluid overload 05-07-2023 admitted for volume overload due to missed HD and dietary/fluid indiscretion. Had HD that removed 3.1 liters of fluid. BP controlled and pt weaned to RA. Pt states she has all the home meds she needs and does not need refills. Pt states she will go to HD tomorrow as scheduled. Nephrology has cleared her for DC today.  Acute hypoxemic respiratory failure (HCC) 05-07-2023 resolved with HD. Pt on RA.  Hypervolemia associated with renal insufficiency 05-07-2023 resolved with HD.  Acute on chronic HFrEF (heart failure with reduced ejection fraction) (HCC) - BNP does NOT correlate to her level of volume overload 05-07-2023 last echo 06-2022. LVEF 45 to 50%. Her acute exacerbation of chronic systolic CHF due to volume overload from missed HD and dietary/fluid indiscretion. Stable for DC to home. Unfortunately, her admission weight was not recorded in ER. DC weight 106.48 lbs.  Weight Information (since admission)     Date/Time Weight Weight in lbs BSA (Calculated - sq m) BMI (Calculated) Who   05/07/23 0629 48.3 kg 106.48 lbs -- 18.87 SM        Hypertensive emergency 05-07-2023 due to missed HD and dietary/fluid indiscretion. Continue with home meds of imdur 30 mg every day, hydralazine 100 mg tid, clonidine 0.2 mg bid, coreg 12.5 mg bid, novasc 10 mg qday.Marland Kitchen  Hyperkalemia 05-07-2023 resolved with HD  Hemiparesis affecting right side as late effect of cerebrovascular accident (CVA) (HCC) 05-07-2023 chronic. Due to prior CVA.  Polysubstance abuse (HCC) 05-07-2023 pt is anuric. Unable to provide urine sample. In march 2023, pt tested positive for cocaine.  Hypertension 05-07-2023 Continue with home meds of  imdur 30 mg every day, hydralazine 100 mg tid, clonidine 0.2 mg bid, coreg 12.5 mg bid, novasc 10 mg qday..  Paroxysmal atrial fibrillation (HCC) 05-07-2023 on eliquis.  History of CVA (cerebrovascular accident) 05-07-2023 stable. On eliquis.  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 05-07-2023 chronic.  Non compliance with medical treatment 05-07-2023 pt with missed HD session and dietary indiscretion.   DVT prophylaxis: apixaban (ELIQUIS) tablet 2.5 mg Start: 05/06/23 1000 apixaban (ELIQUIS) tablet 2.5 mg     Code Status: Full Code Family Communication: no family at bedside. Pt is decisional Disposition Plan: return home Reason for continuing need for hospitalization: nephrology has cleared pt for discharge.  Objective: Vitals:   05/07/23 0350 05/07/23 0629 05/07/23 0740 05/07/23 1108  BP: (!) 151/102  (!) 168/121 (!) 138/92  Pulse: 80  85 80  Resp: 19 18 19    Temp: 98.4 F (36.9 C)  97.6 F (36.4 C) 98.1 F (36.7 C)  TempSrc: Oral  Oral Oral  SpO2: 93%  100% 96%  Weight:  48.3 kg      Intake/Output Summary (Last 24 hours) at 05/07/2023 1147 Last data filed at 05/06/2023 2200 Gross per 24 hour  Intake 120 ml  Output 3100 ml  Net -2980 ml   Filed Weights   05/07/23 0629  Weight: 48.3 kg    Examination:  Physical Exam Vitals and nursing note reviewed.  Constitutional:      General: She is not in acute distress.    Appearance: She is not toxic-appearing or diaphoretic.     Comments: Chronically ill appearing  HENT:     Head: Normocephalic and atraumatic.  Eyes:     General: No scleral icterus. Cardiovascular:     Rate and Rhythm: Normal rate and regular rhythm.  Pulmonary:     Effort: Pulmonary effort is normal. No respiratory distress.     Breath sounds: Normal breath sounds. No wheezing.  Abdominal:     General: Abdomen is flat. Bowel sounds are normal.     Palpations: Abdomen is soft.  Musculoskeletal:     Right lower leg: No edema.     Left lower leg:  No edema.  Skin:    General: Skin is warm and dry.  Neurological:     Mental Status: She is alert and oriented to person, place, and time.     Comments: +dysarthria Mild muscle weakness right side of her body     Data Reviewed: I have personally reviewed following labs and imaging studies  CBC: Recent Labs  Lab 05/06/23 0332 05/06/23 0553  WBC 6.8 7.7  HGB 13.0 12.0  HCT 41.9 39.1  MCV 97.7 98.5  PLT 296 276   Basic Metabolic Panel: Recent Labs  Lab 05/06/23 0332 05/06/23 0553  NA 137 138  K 5.8* 6.0*  CL 100 101  CO2 17* 18*  GLUCOSE 128* 135*  BUN 82* 83*  CREATININE 14.57* 14.71*  CALCIUM 9.2 8.9   GFR: Estimated Creatinine Clearance: 2.7 mL/min (A) (by C-G formula based on SCr of 14.71 mg/dL (H)). Liver Function Tests: Recent Labs  Lab 05/06/23 0332 05/06/23 0553  AST 17 14*  ALT 10 9  ALKPHOS 79 78  BILITOT 0.8 0.8  PROT 7.5 7.1  ALBUMIN 4.0 3.8   BNP (last 3 results) Recent Labs    01/27/23 2306 04/02/23 0208 05/06/23 0333  BNP >4,500.0* >4,500.0* >4,500.0*    Recent Results (from the past 240 hour(s))  Resp panel by RT-PCR (RSV, Flu A&B, Covid) Anterior Nasal Swab     Status: None   Collection Time: 05/06/23  4:00 AM   Specimen: Anterior Nasal Swab  Result Value Ref Range Status   SARS Coronavirus 2 by RT PCR NEGATIVE NEGATIVE Final   Influenza A by PCR NEGATIVE NEGATIVE Final   Influenza B by PCR NEGATIVE NEGATIVE Final    Comment: (NOTE) The Xpert Xpress SARS-CoV-2/FLU/RSV plus assay is intended as an aid in the diagnosis of influenza from Nasopharyngeal swab specimens and should not be used as a sole basis for treatment. Nasal washings and aspirates are unacceptable for Xpert Xpress SARS-CoV-2/FLU/RSV testing.  Fact Sheet for Patients: BloggerCourse.com  Fact Sheet for Healthcare Providers: SeriousBroker.it  This test is not yet approved or cleared by the [place] FDA  and has been authorized for detection and/or diagnosis of SARS-CoV-2 by FDA under an Emergency Use Authorization (EUA). This EUA will remain in effect (meaning this test can be used) for the duration of the COVID-19 declaration under Section 564(b)(1) of the Act, 21 U.S.C. section 360bbb-3(b)(1), unless the authorization is terminated or revoked.     Resp Syncytial Virus by PCR NEGATIVE NEGATIVE Final    Comment: (NOTE) Fact Sheet for Patients: BloggerCourse.com  Fact Sheet for Healthcare Providers: SeriousBroker.it  This test is not yet approved or cleared by the [place] FDA and has been authorized for detection and/or diagnosis of SARS-CoV-2 by FDA under an Emergency Use Authorization (EUA). This EUA will remain in effect (meaning this test can be used) for the duration of the COVID-19 declaration under Section 564(b)(1) of the Act, 21 U.S.C. section 360bbb-3(b)(1), unless the authorization is terminated or revoked.  Performed at [facility], [address], [place], [place] [address]      Radiology Studies: DG Chest [place] 1 View  Result Date: 05/06/2023 CLINICAL DATA:  72 year old female with shortness of breath. EXAM: PORTABLE CHEST 1 VIEW COMPARISON:  Chest radiographs 04/02/2023 and earlier. FINDINGS: Portable AP semi upright view at 0337 hours. Stable right chest dual lumen dialysis type catheter. Stable cardiomegaly, Calcified aortic atherosclerosis., mediastinal contours. Previous sternotomy. Stable lung volumes. Pulmonary vascular congestion diffusely, with minimally improved right lung base ventilation since last month. Stable patchy left lung base opacity. No superimposed pneumothorax. No definite pleural effusion. IMPRESSION: Cardiomegaly and dialysis chest catheter with ongoing pulmonary interstitial edema. Patchy lung base atelectasis. No definite pleural effusion. Electronically Signed   By: Odessa Fleming M.D.    On: 05/06/2023 05:00    Scheduled Meds:  amLODipine  10 mg Oral Daily   apixaban  2.5 mg Oral BID   carvedilol  12.5 mg Oral BID WC   cloNIDine  0.2 mg Oral BID   hydrALAZINE  100 mg Oral Q8H   isosorbide mononitrate  30 mg Oral Daily   pantoprazole  40 mg Oral Daily   rosuvastatin  10 mg Oral Daily   sevelamer carbonate  2.4 g Oral TID WC   Continuous Infusions:   LOS: 1 day   Time spent: 35 minutes  Carollee Herter, DO  [facility]  05/07/2023, 11:47 AM

## 2023-05-09 NOTE — TOC Transition Note (Signed)
Transition of Care - Initial Contact from Inpatient Facility  Date of discharge: 05/07/23 Date of contact: 05/09/23  Method: Phone Spoke to: Patient's Sister  Patient's sister Elinor Dodge) contacted to discuss transition of care from recent inpatient hospitalization. Patient was admitted to [facility] from 05/06/23-05/07/23 with discharge diagnosis of acute hypoxic respiratory failure 2nd volume overload and HTN emergency.  The discharge medication list was reviewed. Patient's sister has no concerns on current medication regimen.  Patient had dialysis at [facility] on 05/07/23. Next HD on 05/11/23.    Salome Holmes, NP

## 2023-05-13 ENCOUNTER — Other Ambulatory Visit: Payer: Self-pay | Admitting: Family Medicine

## 2023-05-13 NOTE — Telephone Encounter (Signed)
Requested Prescriptions  Pending Prescriptions Disp Refills   pantoprazole (PROTONIX) 40 MG tablet [Pharmacy Med Name: PANTOPRAZOLE SODIUM 40 MG ORAL TABLET DELAYED RELEASE] 60 tablet 1    Sig: TAKE 1 TABLET (40 MG TOTAL) BY MOUTH 2 (TWO) TIMES DAILY.     Gastroenterology: Proton Pump Inhibitors Passed - 05/13/2023  1:50 PM      Passed - Valid encounter within last 12 months    Recent Outpatient Visits           9 months ago Encounter for [employer] annual wellness exam   [facility], MD   11 months ago Essential hypertension   [facility] [facility], MD   1 year ago Essential hypertension   [place], MD   1 year ago Essential hypertension   [place] [facility], Lauris Poag, MD   3 years ago Anemia due to stage 5 chronic kidney disease, not on chronic dialysis [facility])   [facility], Gerlene Burdock, NP       Future Appointments             In 1 month Dunn, Tacey Ruiz, PA-C [id number]

## 2023-05-28 ENCOUNTER — Emergency Department (HOSPITAL_COMMUNITY)
Admission: EM | Admit: 2023-05-28 | Discharge: 2023-05-28 | Disposition: A | Discharge status: Home / Self Care | Payer: Medicare Other | Attending: Emergency Medicine | Admitting: Emergency Medicine

## 2023-05-28 ENCOUNTER — Other Ambulatory Visit: Payer: Self-pay

## 2023-05-28 ENCOUNTER — Emergency Department (HOSPITAL_COMMUNITY): Payer: Medicare Other

## 2023-05-28 ENCOUNTER — Encounter (HOSPITAL_COMMUNITY): Payer: Self-pay | Admitting: Emergency Medicine

## 2023-05-28 ENCOUNTER — Encounter (HOSPITAL_COMMUNITY)
Admission: RE | Disposition: A | Discharge status: Home / Self Care | Payer: Self-pay | Source: Home / Self Care | Attending: Nephrology

## 2023-05-28 ENCOUNTER — Ambulatory Visit (HOSPITAL_COMMUNITY)
Admission: RE | Admit: 2023-05-28 | Discharge: 2023-05-28 | Disposition: A | Discharge status: Home / Self Care | Payer: Medicare Other | Attending: Nephrology | Admitting: Nephrology

## 2023-05-28 DIAGNOSIS — D649 Anemia, unspecified: Secondary | ICD-10-CM | POA: Insufficient documentation

## 2023-05-28 DIAGNOSIS — Z8673 Personal history of transient ischemic attack (TIA), and cerebral infarction without residual deficits: Secondary | ICD-10-CM | POA: Insufficient documentation

## 2023-05-28 DIAGNOSIS — T859XXA Unspecified complication of internal prosthetic device, implant and graft, initial encounter: Secondary | ICD-10-CM | POA: Insufficient documentation

## 2023-05-28 DIAGNOSIS — Z7901 Long term (current) use of anticoagulants: Secondary | ICD-10-CM | POA: Insufficient documentation

## 2023-05-28 DIAGNOSIS — N186 End stage renal disease: Secondary | ICD-10-CM | POA: Insufficient documentation

## 2023-05-28 DIAGNOSIS — N250 Renal osteodystrophy: Secondary | ICD-10-CM | POA: Insufficient documentation

## 2023-05-28 DIAGNOSIS — Z992 Dependence on renal dialysis: Secondary | ICD-10-CM | POA: Diagnosis not present

## 2023-05-28 DIAGNOSIS — I4891 Unspecified atrial fibrillation: Secondary | ICD-10-CM | POA: Diagnosis not present

## 2023-05-28 DIAGNOSIS — I132 Hypertensive heart and chronic kidney disease with heart failure and with stage 5 chronic kidney disease, or end stage renal disease: Secondary | ICD-10-CM | POA: Insufficient documentation

## 2023-05-28 DIAGNOSIS — I5022 Chronic systolic (congestive) heart failure: Secondary | ICD-10-CM | POA: Insufficient documentation

## 2023-05-28 DIAGNOSIS — Z452 Encounter for adjustment and management of vascular access device: Secondary | ICD-10-CM | POA: Insufficient documentation

## 2023-05-28 DIAGNOSIS — Z87891 Personal history of nicotine dependence: Secondary | ICD-10-CM | POA: Insufficient documentation

## 2023-05-28 DIAGNOSIS — Z8249 Family history of ischemic heart disease and other diseases of the circulatory system: Secondary | ICD-10-CM | POA: Insufficient documentation

## 2023-05-28 DIAGNOSIS — J449 Chronic obstructive pulmonary disease, unspecified: Secondary | ICD-10-CM | POA: Insufficient documentation

## 2023-05-28 HISTORY — PX: DIALYSIS/PERMA CATHETER INSERTION: CATH118288

## 2023-05-28 LAB — CBC WITH DIFFERENTIAL/PLATELET
Abs Immature Granulocytes: 0.01 10*3/uL (ref 0.00–0.07)
Basophils Absolute: 0 10*3/uL (ref 0.0–0.1)
Basophils Relative: 1 %
Eosinophils Absolute: 0.2 10*3/uL (ref 0.0–0.5)
Eosinophils Relative: 5 %
HCT: 39.5 % (ref 36.0–46.0)
Hemoglobin: 12.3 g/dL (ref 12.0–15.0)
Immature Granulocytes: 0 %
Lymphocytes Relative: 18 %
Lymphs Abs: 0.7 10*3/uL (ref 0.7–4.0)
MCH: 30.2 pg (ref 26.0–34.0)
MCHC: 31.1 g/dL (ref 30.0–36.0)
MCV: 97.1 fL (ref 80.0–100.0)
Monocytes Absolute: 0.6 10*3/uL (ref 0.1–1.0)
Monocytes Relative: 15 %
Neutro Abs: 2.3 10*3/uL (ref 1.7–7.7)
Neutrophils Relative %: 61 %
Platelets: 188 10*3/uL (ref 150–400)
RBC: 4.07 MIL/uL (ref 3.87–5.11)
RDW: 18.5 % — ABNORMAL HIGH (ref 11.5–15.5)
WBC: 3.7 10*3/uL — ABNORMAL LOW (ref 4.0–10.5)
nRBC: 0 % (ref 0.0–0.2)

## 2023-05-28 LAB — TYPE AND SCREEN
ABO/RH(D): O POS
Antibody Screen: NEGATIVE

## 2023-05-28 LAB — BASIC METABOLIC PANEL
Anion gap: 18 — ABNORMAL HIGH (ref 5–15)
BUN: 48 mg/dL — ABNORMAL HIGH (ref 8–23)
CO2: 23 mmol/L (ref 22–32)
Calcium: 8.5 mg/dL — ABNORMAL LOW (ref 8.9–10.3)
Chloride: 100 mmol/L (ref 98–111)
Creatinine, Ser: 8.86 mg/dL — ABNORMAL HIGH (ref 0.44–1.00)
GFR, Estimated: 4 mL/min — ABNORMAL LOW (ref >60)
Glucose, Bld: 91 mg/dL (ref 70–99)
Potassium: 5.2 mmol/L — ABNORMAL HIGH (ref 3.5–5.1)
Sodium: 141 mmol/L (ref 135–145)

## 2023-05-28 LAB — ABO/RH: ABO/RH(D): O POS

## 2023-05-28 SURGERY — DIALYSIS/PERMA CATHETER INSERTION
Anesthesia: LOCAL

## 2023-05-28 MED ORDER — LIDOCAINE-EPINEPHRINE (PF) 2 %-1:200000 IJ SOLN
20.0000 mL | Freq: Once | INTRAMUSCULAR | Status: DC
  Filled 2023-05-28: qty 20

## 2023-05-28 MED ORDER — HEPARIN (PORCINE) IN NACL 1000-0.9 UT/500ML-% IV SOLN
INTRAVENOUS | Status: DC | PRN
  Administered 2023-05-28: 500 mL

## 2023-05-28 MED ORDER — SODIUM CHLORIDE 0.9 % IV SOLN: INTRAVENOUS | Status: DC

## 2023-05-28 MED ORDER — IODIXANOL 320 MG/ML IV SOLN
INTRAVENOUS | Status: DC | PRN
  Administered 2023-05-28: 3 mL via INTRAVENOUS

## 2023-05-28 MED ORDER — LIDOCAINE HCL (PF) 1 % IJ SOLN
INTRAMUSCULAR | Status: AC
  Filled 2023-05-28: qty 30

## 2023-05-28 MED ORDER — FENTANYL CITRATE (PF) 100 MCG/2ML IJ SOLN
INTRAMUSCULAR | Status: DC | PRN
  Administered 2023-05-28: 25 ug via INTRAVENOUS

## 2023-05-28 MED ORDER — OXYCODONE-ACETAMINOPHEN 5-325 MG PO TABS
1.0000 | ORAL_TABLET | Freq: Once | ORAL | Status: AC
  Administered 2023-05-28: 1 via ORAL
  Filled 2023-05-28: qty 1

## 2023-05-28 MED ORDER — LIDOCAINE HCL (PF) 1 % IJ SOLN
INTRAMUSCULAR | Status: DC | PRN
  Administered 2023-05-28: 5 mL

## 2023-05-28 MED ORDER — FENTANYL CITRATE (PF) 100 MCG/2ML IJ SOLN
INTRAMUSCULAR | Status: AC
  Filled 2023-05-28: qty 2

## 2023-05-28 MED ORDER — MIDAZOLAM HCL 2 MG/2ML IJ SOLN
INTRAMUSCULAR | Status: AC
Start: 2023-05-28 — End: ?
  Filled 2023-05-28: qty 2

## 2023-05-28 MED ORDER — MIDAZOLAM HCL 2 MG/2ML IJ SOLN
INTRAMUSCULAR | Status: DC | PRN
  Administered 2023-05-28: .5 mg via INTRAVENOUS

## 2023-05-28 MED ORDER — HEPARIN SODIUM (PORCINE) 1000 UNIT/ML IJ SOLN
INTRAMUSCULAR | Status: DC | PRN
  Administered 2023-05-28 (×2): 1900 [IU] via INTRAVENOUS

## 2023-05-28 MED ORDER — SODIUM CHLORIDE 0.9% FLUSH: 3.0000 mL | INTRAVENOUS | Status: DC | PRN

## 2023-05-28 MED ORDER — LIDOCAINE-EPINEPHRINE (PF) 2 %-1:200000 IJ SOLN
20.0000 mL | Freq: Once | INTRAMUSCULAR | Status: AC
  Administered 2023-05-28: 20 mL

## 2023-05-28 MED ORDER — HEPARIN SODIUM (PORCINE) 1000 UNIT/ML IJ SOLN
INTRAMUSCULAR | Status: AC
Start: 2023-05-28 — End: ?
  Filled 2023-05-28: qty 10

## 2023-05-28 MED ORDER — OXIDIZED CELLULOSE EX PADS
1.0000 | MEDICATED_PAD | Freq: Once | CUTANEOUS | Status: AC
  Administered 2023-05-28: 1 via TOPICAL
  Filled 2023-05-28: qty 1

## 2023-05-28 MED ORDER — LIDOCAINE-EPINEPHRINE 2 %-1:100000 IJ SOLN: 20.0000 mL | Freq: Once | INTRAMUSCULAR | Status: DC

## 2023-05-28 SURGICAL SUPPLY — 6 items
BAG SNAP BAND KOVER 36X36 (MISCELLANEOUS) IMPLANT
CATH ANGIO 5F BER2 65CM (CATHETERS) IMPLANT
CATH PALINDROME-P 23 W/VT (CATHETERS) IMPLANT
COVER DOME SNAP 22 D (MISCELLANEOUS) IMPLANT
GUIDEWIRE ANGLED .035X150CM (WIRE) IMPLANT
TRAY PV CATH (CUSTOM PROCEDURE TRAY) ×2 IMPLANT

## 2023-05-28 NOTE — Op Note (Signed)
Patient presents with poor flows in her right IJ tunneled hemodialysis catheter (23 cm cuff to tip- Palindrome) that was placed 2-1/2 years ago. On examination, aspiration from both ports is good however flushing from the arterial port is sluggish. Chest x-ray confirms the catheter tip is appropriately positioned in the RA. The catheter cuff is 1/2cm deep to the exit site.    Summary:  1) The patient had a successful 23 cm CTT Palindrome hemodialysis catheter exchange in the right internal jugular vein.  Very good flush and return through both ports. 2) No sheath noted through either port. 3) Okay to use catheter immediately.  Description of procedure: The right neck, chest and the catheter were prepped and draped in the usual sterile fashion. The exit site and adjacent tunnel tract were anesthetized with lidocaine 1% with epinephrine. The cuff was freed with blunt and sharp dissection with a  hemostat and manual traction; the dense fibrin sheath in the tunnel. The catheter was withdrawn and venogram through each of the ports was performed; there was no fibrin sheath evident through either port.   A hydrophilic wire was manipulated and advanced through the arterial port and the tip was parked in the IVC to the aid of a Bernstein after the catheter was completely removed + noted to be entirely intact. A new 23 cm cuff to tip Palindrome catheter was inserted over the guidewire and the tip parked in the right atrium and at that point the cuff was approximately 1 cm deep to the exit site.   Aspiration and flushing of both limbs of the catheter confirmed excellent flow. No kinks were visible on fluoroscopic imaging. Both limbs of the catheter were locked with citrate and sterile caps were placed.   The hub was secured on to the chest wall with 2-0 nylon wing sutures.  Sterile dressings were placed, and the patient returned to recovery in stable condition.  Sedation: 0.5 mg Versed, 25 mcg  Fentanyl Sedation time: 25  minutes  Contrast: 3 mL  Monitoring: Because of the patient's comorbid conditions and sedation during the procedure, continuous EKG monitoring and O2 saturation monitoring was performed throughout the procedure by the RN. There were no abnormal arrhythmias encountered.  Complications: None.  Diagnoses:   T82.49XA Other complication of vascular dialysis catheter (Poor flows) N18.6 End stage renal disease  Z99.2 Dialysis dependence  Procedures Coding:  36581 Tunneled catheter exchange 77001  Fluoroscopy guidance for catheter exchange. D6644 Contrast  Recommendations: Remove the suture in 3 weeks. 2.   Report any blood flow problems to [facility].  Discharge: The patient was discharged home in stable condition. The patient was given education regarding the care of the catheter and specific instructions in case of any problems.

## 2023-05-28 NOTE — Discharge Instructions (Signed)
 General care instructions: - Do not drive or operate heavy machinery for 24hrs - Avoid making any important decisions for the remainder of the day. - You should be able to eat, drink, and resume your normal medications. - Avoid any strenuous activity for the remainder of the day. Potential complications: - You are bleeding at the exit or venotomy site and it will not stop with direct pressure; if there is a slow ooze apply pressure over the venotomy site neck region where the catheter can be felt for 5 minutes.  - You have a fever, swelling, see redness or feel heat over the tunnel or exit site. 3.  Medication instructions: - Continue routine medications unless otherwise instructed. 4. Please have your sutures removed at the hub site in 3 weeks at dialysis.

## 2023-05-28 NOTE — Discharge Instructions (Addendum)
When you go to dialysis tomorrow, please ask them to place a new dressing beneath your Port-A-Cath.  Return to the emergency department if you have continued bleeding from the area.

## 2023-05-28 NOTE — ED Triage Notes (Signed)
Patient BIB EMS for evaluation of bleeding from surgical site.  Pt reports she had her dialysis port "replaced today because they didn't want it to get an infection."   No reports of complications with port prior to surgery.  States she was discharged home today after procedure and bleeding has continued.  Blood soaked gauze noted to chest.  Last dialysis was Thursday.  Has not missed dialysis.  Reports increased pain to site

## 2023-05-28 NOTE — ED Provider Notes (Signed)
[place] EMERGENCY DEPARTMENT AT [facility] Provider Note   CSN: [id number] Arrival date & time: 05/28/23  1935     History  Chief Complaint  Patient presents with  . Post-op Problem    Monique Henderson is a 71 y.o. female.  72 year old female here today with bleeding from Port-A-Cath site that was placed today.  Patient had Port-A-Cath placed to facilitate dialysis.       Home Medications Prior to Admission medications   Medication Sig Start Date End Date Taking? Authorizing Provider  albuterol (PROVENTIL) (2.5 MG/3ML) 0.083% nebulizer solution Take 2.5 mg by nebulization every 6 (six) hours as needed for wheezing or shortness of breath.    [provider]  albuterol (VENTOLIN HFA) 108 (90 Base) MCG/ACT inhaler Inhale 2 puffs into the lungs every 6 (six) hours as needed for wheezing or shortness of breath. 07/30/20   Janeece Agee, NP  amLODipine (NORVASC) 10 MG tablet Take 1 tablet (10 mg total) by mouth daily. 03/06/22   Setzer, Lynnell Jude, PA-C  apixaban (ELIQUIS) 2.5 MG TABS tablet Take 1 tablet (2.5 mg total) by mouth 2 (two) times daily. 03/06/22   Setzer, Lynnell Jude, PA-C  carvedilol (COREG) 25 MG tablet Take 1 tablet (25 mg total) by mouth 2 (two) times daily with a meal. 05/01/22 05/07/23  Ghimire, Werner Lean, MD  Cholecalciferol (VITAMIN D-3 PO) Take 1 capsule by mouth daily.    [provider]  cloNIDine (CATAPRES) 0.1 MG tablet Take 1 tablet (0.1 mg total) by mouth 2 (two) times daily. 12/18/22   Corky Crafts, MD  hydrALAZINE (APRESOLINE) 100 MG tablet Take 1 tablet (100 mg total) by mouth every 8 (eight) hours. 12/18/22   Corky Crafts, MD  isosorbide mononitrate (IMDUR) 30 MG 24 hr tablet TAKE 1 TABLET (30MG ) ON DIALYSIS DAYS AND 2 TABLETS (60 MG) ON NON DIALYSIS DAYS. 01/12/23   Corky Crafts, MD  naloxone [facility]) nasal spray 4 mg/0.1 mL 1 spray as directed. 02/19/22   [provider]  oxyCODONE-acetaminophen  (PERCOCET) 10-325 MG tablet Take 1 tablet by mouth in the morning and at bedtime. 03/12/22   [provider]  pantoprazole (PROTONIX) 40 MG tablet TAKE 1 TABLET (40 MG TOTAL) BY MOUTH 2 (TWO) TIMES DAILY. 05/13/23   Georganna Skeans, MD  rosuvastatin (CRESTOR) 10 MG tablet TAKE 1 TABLET (10 MG TOTAL) BY MOUTH DAILY FOR CHOLESTEROL 08/07/22   Georganna Skeans, MD  senna-docusate (SENOKOT-S) 8.6-50 MG tablet Take 1 tablet by mouth 2 (two) times daily between meals as needed for mild constipation. Patient taking differently: Take 1 tablet by mouth 2 (two) times daily as needed for mild constipation. 08/14/22   Almon Hercules, MD  sevelamer carbonate (RENVELA) 2.4 g PACK Take 2.4 g by mouth 3 (three) times daily with meals. 03/06/22   Setzer, Lynnell Jude, PA-C  traZODone (DESYREL) 50 MG tablet Take 25-50 mg by mouth at bedtime. 03/19/23   [provider]      Allergies    Shrimp [shellfish allergy], Bactroban [mupirocin], Hydrocodone, Chlorhexidine gluconate, Tylenol [acetaminophen], and Zestril [lisinopril]    Review of Systems   Review of Systems  Physical Exam Updated Vital Signs BP (!) 179/96 (BP Location: Left Arm)   Pulse 69   Temp 98.3 F (36.8 C) (Oral)   Resp 18   Ht 5\' 3"  (1.6 m)   Wt 48.1 kg   SpO2 99%   BMI 18.78 kg/m  Physical Exam Vitals reviewed.  Eyes:     Pupils: Pupils are equal, round, and reactive to light.  Cardiovascular:     Rate and Rhythm: Normal rate.     Comments: There is a right subclavian Port-A-Cath placed.  Patient with dressings covered in blood. Skin:    General: Skin is warm and dry.  Neurological:     General: No focal deficit present.     Mental Status: She is alert.    ED Results / Procedures / Treatments   Labs (all labs ordered are listed, but only abnormal results are displayed) Labs Reviewed  BASIC METABOLIC PANEL - Abnormal; Notable for the following components:      Result Value   Potassium 5.2 (*)    BUN 48 (*)     Creatinine, Ser 8.86 (*)    Calcium 8.5 (*)    GFR, Estimated 4 (*)    Anion gap 18 (*)    All other components within normal limits  CBC WITH DIFFERENTIAL/PLATELET - Abnormal; Notable for the following components:   WBC 3.7 (*)    RDW 18.5 (*)    All other components within normal limits  TYPE AND SCREEN  ABO/RH    EKG None  Radiology DG Chest Port 1 View Result Date: 05/28/2023 CLINICAL DATA:  Bleeding from dialysis catheter site, initial encounter EXAM: PORTABLE CHEST 1 VIEW COMPARISON:  05/06/2023 FINDINGS: Cardiac shadow is enlarged. Postsurgical changes are again noted. Right jugular dialysis catheter is noted in satisfactory position. No complicating factors are seen. Lungs are clear. No bony abnormality is noted. IMPRESSION: No active disease. Electronically Signed   By: Alcide Clever M.D.   On: 05/28/2023 20:58    Procedures .Laceration Repair  Date/Time: 05/28/2023 10:00 PM  Performed by: Arletha Pili, DO Authorized by: Arletha Pili, DO   Laceration details:    Length (cm):  0.2 Comments:     Anesthetize area surrounding Port-A-Cath placement with 2% lidocaine with epinephrine.  3-0 Ethilon suture utilized.  Placed two figure 4 sutures and area of venous oozing.  Then placed a pursestring suture around Port-A-Cath.  Surgicel dressing placed over.  Hemostasis achieved.  Patient tolerated procedure well.     Medications Ordered in ED Medications  lidocaine-EPINEPHrine (XYLOCAINE W/EPI) 2 %-1:200000 (PF) injection 20 mL (20 mLs Infiltration Given 05/28/23 2040)  oxidized cellulose (Surgicel) pad 1 each (1 each Topical Given 05/28/23 2125)    ED Course/ Medical Decision Making/ A&P                                 Medical Decision Making 72 year old female here today with bleeding from Port-A-Cath placement.  Plan-was able to achieve hemostasis on the patient.  Dressing applied.  Chest x-ray showed good placement of Port-A-Cath.  Patient hemodynamically  stable, CBC does not show anemia.  Patient is going to dialysis tomorrow.  They will be able to assess her Port-A-Cath.  Will discharge patient.  Amount and/or Complexity of Data Reviewed Labs: ordered. Radiology: ordered.  Risk Prescription drug management.            Final Clinical Impression(s) / ED Diagnoses Final diagnoses:  Encounter for care related to Port-a-Cath    Rx / DC Orders ED Discharge Orders     None         Arletha Pili, DO 05/28/23 2204

## 2023-05-28 NOTE — H&P (Addendum)
Chief Complaint: Decreased flows  Interval H&P   The patient has presented today for tunneled catheter exchange given and poor flows followed at [facility] by Dr. Thedore Mins.    Various methods of treatment have been discussed with the patient.  After consideration of risk, benefits and other options for treatment, the patient has consented to a tunneled catheter insertion.   Risks  of bleeding, pain, infection, nonhealing wound, lung and carotid artery injury were explained to the patient.  The patient's history has been reviewed and the patient has been examined, no changes in status.  Stable for tunneled catheter insertion.  I have reviewed the patient's chart and labs.  Questions were answered to the patient's satisfaction.  Dialysis Orders: [facility], TTS, 3.5 hr  EDW 44.9 kg, 2k2ca, RIJ TDC. Mircera 225 mcg on 11/25 Hectorol 9 mcg three times per week  Sensipar 60 mg  Assessment/Plan: ESRD dialyzing at [facility] TTS regimen. Decreased access flows - planning on catheter exchange of the right internal jugular tunneled catheter.   Renal osteodystrophy - continue binders per home regimen. Anemia - managed with ESA's and IV iron at dialysis center. HTN - resume home regimen. 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 female with a history of a prior CVA, atrial fibrillation, right ureteral stone, aortic dissection, DCIS status postlumpectomy, coronary atherosclerotic heart disease, COPD, ESRD being dialyzed Tuesday Thursdays and Saturdays at [facility] with Dr. Thedore Mins.  Patient is being referred for poor flows through the right internal jugular catheter which according to the patient has been in place for 2-1/2 years.  Patient denies any fever chills nausea vomiting, sore throat or myalgias.  ROS Per HPI.  Chemistry and CBC: Creatinine  Date/Time Value Ref Range Status  09/28/2012 08:31 AM 1.2 (H) 0.6 - 1.1 mg/dL Final   Creat   Date/Time Value Ref Range Status  01/18/2017 11:45 AM 2.09 (H) 0.50 - 0.99 mg/dL Final    Comment:      For patients > or = 70 years of age: The upper reference limit for Creatinine is approximately 13% higher for people identified as African-American.     12/09/2016 02:58 PM 2.23 (H) 0.50 - 0.99 mg/dL Final    Comment:      For patients > or = 70 years of age: The upper reference limit for Creatinine is approximately 13% higher for people identified as African-American.     02/14/2014 09:51 AM 1.24 (H) 0.50 - 1.10 mg/dL Final  25/36/6440 34:74 AM 1.52 (H) 0.50 - 1.10 mg/dL Final  25/95/6387 56:43 AM 1.33 (H) 0.50 - 1.10 mg/dL Final  32/95/1884 16:60 AM 1.38 (H) 0.50 - 1.10 mg/dL Final  63/06/6008 93:23 PM 2.44 (H) 0.50 - 1.10 mg/dL Final  55/73/2202 54:27 PM 1.66 (H) 0.50 - 1.10 mg/dL Final  11/22/7626 31:51 AM 1.43 (H) 0.50 - 1.10 mg/dL Final   Creatinine, Ser  Date/Time Value Ref Range Status  05/06/2023 05:53 AM 14.71 (H) 0.44 - 1.00 mg/dL Final  76/16/0737 10:62 AM 14.57 (H) 0.44 - 1.00 mg/dL Final  69/48/5462 70:35 PM 3.77 (H) 0.44 - 1.00 mg/dL Final  00/93/8182 99:37 AM 6.29 (H) 0.44 - 1.00 mg/dL Final  16/96/7893 81:01 AM 10.52 (H) 0.44 - 1.00 mg/dL Final  75/03/2584 27:78 AM 9.87 (H) 0.44 - 1.00 mg/dL Final  24/23/5361 44:31 AM 10.54 (H) 0.44 - 1.00 mg/dL Final  54/00/8676 19:50 AM 8.44 (H) 0.44 - 1.00 mg/dL Final  01/27/2023 11:18 PM 14.20 (H) 0.44 - 1.00 mg/dL Final  16/03/9603 54:09 PM 12.90 (H) 0.44 - 1.00 mg/dL Final  81/19/1478 29:56 AM 2.95 (H) 0.44 - 1.00 mg/dL Final  21/30/8657 84:69 AM 4.33 (H) 0.44 - 1.00 mg/dL Final    Comment:    DELTA CHECK NOTED  01/15/2023 02:59 AM 1.10 (H) 0.44 - 1.00 mg/dL Final    Comment:    DIALYSIS  01/14/2023 09:34 PM 9.22 (H) 0.44 - 1.00 mg/dL Final  62/95/2841 32:44 PM 8.91 (H) 0.44 - 1.00 mg/dL Final  06/03/7251 66:44 AM 8.62 (H) 0.44 - 1.00 mg/dL Final  03/47/4259 56:38 AM 10.10 (H) 0.44 - 1.00 mg/dL Final   75/64/3329 51:88 AM 18.86 (H) 0.44 - 1.00 mg/dL Final  41/66/0630 16:01 AM 19.00 (H) 0.44 - 1.00 mg/dL Final  09/32/3557 32:20 AM 8.24 (H) 0.44 - 1.00 mg/dL Final  25/42/7062 37:62 AM 5.49 (H) 0.44 - 1.00 mg/dL Final  83/15/1761 60:73 AM 9.97 (H) 0.44 - 1.00 mg/dL Final  71/10/2692 85:46 PM 14.52 (H) 0.44 - 1.00 mg/dL Final  27/07/5007 38:18 AM 5.26 (H) 0.44 - 1.00 mg/dL Final  29/93/7169 67:89 AM 6.53 (H) 0.44 - 1.00 mg/dL Final    Comment:    DIALYSIS  04/29/2022 08:39 PM 15.48 (H) 0.44 - 1.00 mg/dL Final  38/03/1750 02:58 PM 9.82 (H) 0.44 - 1.00 mg/dL Final  52/77/8242 35:36 AM 10.18 (H) 0.44 - 1.00 mg/dL Final  14/43/1540 08:67 AM 7.38 (H) 0.44 - 1.00 mg/dL Final  61/95/0932 67:12 AM 11.35 (H) 0.44 - 1.00 mg/dL Final  45/80/9983 38:25 AM 7.22 (H) 0.44 - 1.00 mg/dL Final  05/39/7673 41:93 AM 7.92 (H) 0.44 - 1.00 mg/dL Final    Comment:    DELTA CHECK NOTED  02/26/2022 02:56 PM 4.56 (H) 0.44 - 1.00 mg/dL Final    Comment:    DIALYSIS DELTA CHECK NOTED   02/24/2022 04:11 AM 10.03 (H) 0.44 - 1.00 mg/dL Final  79/07/4095 35:32 PM 8.96 (H) 0.44 - 1.00 mg/dL Final  99/24/2683 41:96 AM 3.74 (H) 0.44 - 1.00 mg/dL Final    Comment:    DIALYSIS DELTA CHECK NOTED   01/29/2022 11:31 AM 8.80 (H) 0.44 - 1.00 mg/dL Final  22/29/7989 21:19 AM 7.97 (H) 0.44 - 1.00 mg/dL Final  41/74/0814 48:18 AM 9.06 (H) 0.44 - 1.00 mg/dL Final  56/31/4970 26:37 AM 6.44 (H) 0.44 - 1.00 mg/dL Final  85/88/5027 74:12 PM 5.13 (H) 0.44 - 1.00 mg/dL Final    Comment:    DELTA CHECK NOTED  01/06/2022 10:46 AM 12.75 (H) 0.44 - 1.00 mg/dL Final  87/86/7672 09:47 AM 10.08 (H) 0.44 - 1.00 mg/dL Final  09/62/8366 29:47 AM 6.83 (H) 0.44 - 1.00 mg/dL Final    Comment:    DELTA CHECK NOTED  12/27/2021 12:54 AM 10.32 (H) 0.44 - 1.00 mg/dL Final  65/46/5035 46:56 AM 8.11 (H) 0.44 - 1.00 mg/dL Final  81/27/5170 01:74 PM 6.22 (H) 0.44 - 1.00 mg/dL Final    Comment:    PATIENT WENT TO DIALYSIS  12/25/2021 01:45 AM  14.47 (H) 0.44 - 1.00 mg/dL Final  94/49/6759 16:38 PM 14.70 (H) 0.44 - 1.00 mg/dL Final  46/65/9935 70:17 PM 13.37 (H) 0.44 - 1.00 mg/dL Final  79/39/0300 92:33 AM 11.40 (H) 0.44 - 1.00 mg/dL Final  00/76/2263 33:54 PM 11.90 (H) 0.44 - 1.00 mg/dL Final   No results for input(s): "NA", "K", "CL", "CO2", "GLUCOSE", "BUN", "CREATININE", "CALCIUM", "PHOS" in the last 168 hours.  Invalid input(s): "ALB"  No results for input(s): "WBC", "NEUTROABS", "HGB", "HCT", "MCV", "PLT" in the last 168 hours. Liver Function Tests: No results for input(s): "AST", "ALT", "ALKPHOS", "BILITOT", "PROT", "ALBUMIN" in the last 168 hours. No results for input(s): "LIPASE", "AMYLASE" in the last 168 hours. No results for input(s): "AMMONIA" in the last 168 hours. Cardiac Enzymes: No results for input(s): "CKTOTAL", "CKMB", "CKMBINDEX", "TROPONINI" in the last 168 hours. Iron Studies: No results for input(s): "IRON", "TIBC", "TRANSFERRIN", "FERRITIN" in the last 72 hours. PT/INR: @LABRCNTIP (inr:5)  Xrays/Other Studies: )No results found. However, due to the size of the patient record, not all encounters were searched. Please check Results Review for a complete set of results. No results found.  PMH:   Past Medical History:  Diagnosis Date   Acute cardioembolic stroke (HCC) 02/26/2022   AF (paroxysmal atrial fibrillation) (HCC) 05/29/2019   on Coumadin   Aortic atherosclerosis (HCC) 07/05/2019   Aortic dissection (HCC) 04/04/2019   s/p repair   Bone spur 2008   Right calcaneal foot spur   Breast cancer (HCC) 2004   Ductal carcinoma in situ of the left breast; S/P left partial mastectomy 02/26/2003; S/P re-excision of cranial and lateral margins11/18/2004.radiation   Carotid artery disease (HCC)    Cerebral thrombosis with cerebral infarction 05/22/2019   Chronic HFrEF (heart failure with reduced ejection fraction) (HCC)    Chronic low back pain 06/22/2016   Chronic obstructive lung disease (HCC)  01/16/2017   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 Chevis Pretty; S/P re-excision of superior and inferior margins 10/27/2012.    Dissection of aorta (HCC) 04/03/2019   ESRD on hemodialysis (HCC) 05/29/2019   Essential hypertension 09/16/2006   GERD 09/16/2006   Hepatitis C    treated and RNA confirmed not detectable 01/2017   Insomnia 03/14/2015   Malnutrition of moderate degree 05/19/2019   Non compliance with medical treatment 12/04/2017   Normocytic anemia    With thrombocytosis   Osteoarthritis    Right ureteral stone 2002   S/P lumbar spinal fusion 01/18/2014   S/P lumbar decompressive laminectomy, fusion, and plating for lumbar spinal stenosis on 05/27/2009 by Dr. Tia Alert.  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 Tia Alert.     Stroke (cerebrum) (HCC)    Stroke (HCC) 02/23/2022   SVT (supraventricular tachycardia) (HCC) 08/28/2019   Tobacco use disorder 04/19/2009   Uterine fibroid    Wears dentures    top    PSH:   Past Surgical History:  Procedure Laterality Date   ANTERIOR LAT LUMBAR FUSION N/A 01/18/2014   Procedure: ANTERIOR LATERAL LUMBAR FUSION LUMBAR TWO-THREE;  Surgeon: Tia Alert, MD;  Location: [facility];  Service: Neurosurgery;  Laterality: N/A;   ANTERIOR LUMBAR FUSION  01/18/2014   AV FISTULA PLACEMENT Left 04/20/2019   Procedure: ARTERIOVENOUS (AV) FISTULA CREATION;  Surgeon: Maeola Harman, MD;  Location: [facility] OR;  Service: Vascular;  Laterality: Left;   BACK SURGERY     BREAST LUMPECTOMY Left 01/2003   BREAST LUMPECTOMY Right 2014   BREAST LUMPECTOMY WITH NEEDLE LOCALIZATION AND AXILLARY SENTINEL LYMPH NODE BX Right 10/13/2012   Procedure: BREAST LUMPECTOMY WITH NEEDLE LOCALIZATION;   Surgeon: Robyne Askew, MD;  Location: [facility];  Service: General;  Laterality: Right;  Right breast wire localized lumpectomy   INSERTION OF DIALYSIS CATHETER Right 04/20/2019   Procedure: INSERTION OF DIALYSIS CATHETER, right internal jugular;  Surgeon: Maeola Harman, MD;  Location: [facility] OR;  Service: Vascular;  Laterality: Right;   INSERTION OF DIALYSIS CATHETER Right 09/24/2020   Procedure: INSERTION OF TUNNELED DIALYSIS CATHETER;  Surgeon: Larina Earthly, MD;  Location: [facility] OR;  Service: Vascular;  Laterality: Right;   IR FLUORO GUIDE CV LINE RIGHT  09/22/2020   IR THORACENTESIS ASP PLEURAL SPACE W/IMG GUIDE  05/19/2019   IR US GUIDE VASC ACCESS LEFT  09/22/2020   IR US GUIDE VASC ACCESS RIGHT  09/22/2020   IR VENOCAVAGRAM SVC  09/22/2020   LAMINECTOMY  05/27/2009   Lumbar decompressive laminectomy, fusion and plating for lumbar spinal stensosis   LIGATION OF ARTERIOVENOUS  FISTULA Left 09/24/2020   Procedure: LIGATION OF LEFT ARM ARTERIOVENOUS  FISTULA;  Surgeon: Larina Earthly, MD;  Location: [facility] OR;  Service: Vascular;  Laterality: Left;   LUMBAR LAMINECTOMY/DECOMPRESSION MICRODISCECTOMY Left 03/23/2013   Procedure: LUMBAR LAMINECTOMY/DECOMPRESSION MICRODISCECTOMY 1 LEVEL;  Surgeon: Tia Alert, MD;  Location: [facility];  Service: Neurosurgery;  Laterality: Left;  LUMBAR LAMINECTOMY/DECOMPRESSION MICRODISCECTOMY 1 LEVEL   MASTECTOMY, PARTIAL Left 02/26/2003   ; S/P re-excision of cranial and lateral margins 04/19/2003.    RE-EXCISION OF BREAST CANCER,SUPERIOR MARGINS Right 10/27/2012   Procedure: RE-EXCISION OF BREAST CANCER,SUPERIOR and inferior MARGINS;  Surgeon: Robyne Askew, MD;  Location: [facility] OR;  Service: General;  Laterality: Right;   RE-EXCISION OF BREAST LUMPECTOMY Left 04/2003   TEE WITHOUT CARDIOVERSION N/A 04/04/2019   Procedure: Transesophageal Echocardiogram (Tee);  Surgeon: Linden Dolin, MD;  Location: [facility] OR;  Service: Open Heart Surgery;   Laterality: N/A;   THORACIC AORTIC ANEURYSM REPAIR N/A 04/04/2019   Procedure: THORACIC ASCENDING ANEURYSM REPAIR (AAA)  USING 28 MM X 30 CM HEMASHIELD PLATINUM VASCULAR GRAFT;  Surgeon: Linden Dolin, MD;  Location: [facility] OR;  Service: Open Heart Surgery;  Laterality: N/A;    Allergies:  Allergies  Allergen Reactions   Shrimp [Shellfish Allergy] Shortness Of Breath   Bactroban [Mupirocin] Other (See Comments)    "Sores in nose"   Hydrocodone Itching, Nausea Only and Nausea And Vomiting   Chlorhexidine Gluconate Itching   Tylenol [Acetaminophen] Itching    Takes Percocet at home 05/06/23   Zestril [Lisinopril] Cough    Medications:   Prior to Admission medications   Medication Sig Start Date End Date Taking? Authorizing Provider  albuterol (PROVENTIL) (2.5 MG/3ML) 0.083% nebulizer solution Take 2.5 mg by nebulization every 6 (six) hours as needed for wheezing or shortness of breath.    [provider]  albuterol (VENTOLIN HFA) 108 (90 Base) MCG/ACT inhaler Inhale 2 puffs into the lungs every 6 (six) hours as needed for wheezing or shortness of breath. 07/30/20   Janeece Agee, NP  amLODipine (NORVASC) 10 MG tablet Take 1 tablet (10 mg total) by mouth daily. 03/06/22   Setzer, Lynnell Jude, PA-C  apixaban (ELIQUIS) 2.5 MG TABS tablet Take 1 tablet (2.5 mg total) by mouth 2 (two) times daily. 03/06/22   Setzer, Lynnell Jude, PA-C  carvedilol (COREG) 25 MG tablet Take 1 tablet (25 mg total) by mouth 2 (two) times daily with a meal. 05/01/22 05/07/23  Ghimire, Werner Lean, MD  Cholecalciferol (VITAMIN D-3 PO) Take 1 capsule by mouth daily.    [provider]  cloNIDine (CATAPRES) 0.1 MG tablet Take 1 tablet (0.1 mg  total) by mouth 2 (two) times daily. 12/18/22   Corky Crafts, MD  hydrALAZINE (APRESOLINE) 100 MG tablet Take 1 tablet (100 mg total) by mouth every 8 (eight) hours. 12/18/22   Corky Crafts, MD  isosorbide mononitrate (IMDUR) 30 MG 24 hr tablet TAKE 1 TABLET  (30MG ) ON DIALYSIS DAYS AND 2 TABLETS (60 MG) ON NON DIALYSIS DAYS. 01/12/23   Corky Crafts, MD  naloxone [facility]) nasal spray 4 mg/0.1 mL 1 spray as directed. 02/19/22   [provider]  oxyCODONE-acetaminophen (PERCOCET) 10-325 MG tablet Take 1 tablet by mouth in the morning and at bedtime. 03/12/22   [provider]  pantoprazole (PROTONIX) 40 MG tablet TAKE 1 TABLET (40 MG TOTAL) BY MOUTH 2 (TWO) TIMES DAILY. 05/13/23   Georganna Skeans, MD  rosuvastatin (CRESTOR) 10 MG tablet TAKE 1 TABLET (10 MG TOTAL) BY MOUTH DAILY FOR CHOLESTEROL 08/07/22   Georganna Skeans, MD  senna-docusate (SENOKOT-S) 8.6-50 MG tablet Take 1 tablet by mouth 2 (two) times daily between meals as needed for mild constipation. Patient taking differently: Take 1 tablet by mouth 2 (two) times daily as needed for mild constipation. 08/14/22   Almon Hercules, MD  sevelamer carbonate (RENVELA) 2.4 g PACK Take 2.4 g by mouth 3 (three) times daily with meals. 03/06/22   Setzer, Lynnell Jude, PA-C  traZODone (DESYREL) 50 MG tablet Take 25-50 mg by mouth at bedtime. 03/19/23   [provider]    Discontinued Meds:  There are no discontinued medications.  Social History:  reports that she quit smoking about 2 years ago. Her smoking use included cigarettes. She started smoking about 47 years ago. She has a 11 pack-year smoking history. She has never used smokeless tobacco. She reports that she does not currently use alcohol after a past usage of about 2.0 standard drinks of alcohol per week. She reports that she does not currently use drugs after having used the following drugs: Cocaine.  Family History:   Family History  Problem Relation Age of Onset   Colon cancer Mother 108   Hypertension Mother    Diabetes Sister 16   Hypertension Sister    Diabetes Brother    Hypertension Brother    Diabetes Brother    Hypertension Brother    Kidney disease Son        s/p renal transplant   Hypertension Son     Diabetes Son    Multiple sclerosis Son    Bone cancer Sister 41   Breast cancer Neg Hx    Cervical cancer Neg Hx     Blood pressure 131/89, pulse 67, resp. rate 14, weight 48.1 kg, SpO2 98%. General: Chronically-ill appearing; NAD; on RA Heart: S1 and S2; No MRGs Lungs: CTA; No wheezing, rales, or rhonchi Abdomen: Soft and non-tender Extremities: No LE edema Dialysis Access: RIJ TDC       Bron Snellings, Len Blalock, MD 05/28/2023, 9:04 AM

## 2023-05-29 ENCOUNTER — Emergency Department (HOSPITAL_COMMUNITY)
Admission: EM | Admit: 2023-05-29 | Discharge: 2023-05-29 | Disposition: A | Discharge status: Home / Self Care | Payer: Medicare Other | Attending: Emergency Medicine | Admitting: Emergency Medicine

## 2023-05-29 ENCOUNTER — Encounter (HOSPITAL_COMMUNITY): Payer: Self-pay

## 2023-05-29 ENCOUNTER — Other Ambulatory Visit: Payer: Self-pay

## 2023-05-29 DIAGNOSIS — Z7901 Long term (current) use of anticoagulants: Secondary | ICD-10-CM | POA: Diagnosis not present

## 2023-05-29 DIAGNOSIS — Z992 Dependence on renal dialysis: Secondary | ICD-10-CM | POA: Diagnosis not present

## 2023-05-29 DIAGNOSIS — T82594A Other mechanical complication of infusion catheter, initial encounter: Secondary | ICD-10-CM | POA: Diagnosis present

## 2023-05-29 DIAGNOSIS — T829XXD Unspecified complication of cardiac and vascular prosthetic device, implant and graft, subsequent encounter: Secondary | ICD-10-CM

## 2023-05-29 MED ORDER — OXYCODONE-ACETAMINOPHEN 5-325 MG PO TABS
2.0000 | ORAL_TABLET | Freq: Once | ORAL | Status: AC
  Administered 2023-05-29: 2 via ORAL
  Filled 2023-05-29: qty 2

## 2023-05-29 NOTE — Discharge Instructions (Signed)
Leave the dressing on until you have dialysis  Return for repeat bleeding that does not improve with pressure for 15 min

## 2023-05-29 NOTE — ED Provider Notes (Signed)
[place] EMERGENCY DEPARTMENT AT [facility] Provider Note   CSN: [id number] Arrival date & time: 05/29/23  6433     History  Chief Complaint  Patient presents with   Vascular Access Problem    Monique Henderson is a 72 y.o. female.  72 yo F with a chief complaints is bleeding through her dressing around her new tunneled dialysis catheter.  She was just seen here yesterday for the same.  Had the catheter placed yesterday.  She denies any other current issues.        Home Medications Prior to Admission medications   Medication Sig Start Date End Date Taking? Authorizing Provider  albuterol (PROVENTIL) (2.5 MG/3ML) 0.083% nebulizer solution Take 2.5 mg by nebulization every 6 (six) hours as needed for wheezing or shortness of breath.    [provider]  albuterol (VENTOLIN HFA) 108 (90 Base) MCG/ACT inhaler Inhale 2 puffs into the lungs every 6 (six) hours as needed for wheezing or shortness of breath. 07/30/20   Janeece Agee, NP  amLODipine (NORVASC) 10 MG tablet Take 1 tablet (10 mg total) by mouth daily. 03/06/22   Setzer, Lynnell Jude, PA-C  apixaban (ELIQUIS) 2.5 MG TABS tablet Take 1 tablet (2.5 mg total) by mouth 2 (two) times daily. 03/06/22   Setzer, Lynnell Jude, PA-C  carvedilol (COREG) 25 MG tablet Take 1 tablet (25 mg total) by mouth 2 (two) times daily with a meal. 05/01/22 05/07/23  Ghimire, Werner Lean, MD  Cholecalciferol (VITAMIN D-3 PO) Take 1 capsule by mouth daily.    [provider]  cloNIDine (CATAPRES) 0.1 MG tablet Take 1 tablet (0.1 mg total) by mouth 2 (two) times daily. 12/18/22   Corky Crafts, MD  hydrALAZINE (APRESOLINE) 100 MG tablet Take 1 tablet (100 mg total) by mouth every 8 (eight) hours. 12/18/22   Corky Crafts, MD  isosorbide mononitrate (IMDUR) 30 MG 24 hr tablet TAKE 1 TABLET (30MG ) ON DIALYSIS DAYS AND 2 TABLETS (60 MG) ON NON DIALYSIS DAYS. 01/12/23   Corky Crafts, MD  naloxone [facility]) nasal spray 4  mg/0.1 mL 1 spray as directed. 02/19/22   [provider]  oxyCODONE-acetaminophen (PERCOCET) 10-325 MG tablet Take 1 tablet by mouth in the morning and at bedtime. 03/12/22   [provider]  pantoprazole (PROTONIX) 40 MG tablet TAKE 1 TABLET (40 MG TOTAL) BY MOUTH 2 (TWO) TIMES DAILY. 05/13/23   Georganna Skeans, MD  rosuvastatin (CRESTOR) 10 MG tablet TAKE 1 TABLET (10 MG TOTAL) BY MOUTH DAILY FOR CHOLESTEROL 08/07/22   Georganna Skeans, MD  senna-docusate (SENOKOT-S) 8.6-50 MG tablet Take 1 tablet by mouth 2 (two) times daily between meals as needed for mild constipation. Patient taking differently: Take 1 tablet by mouth 2 (two) times daily as needed for mild constipation. 08/14/22   Almon Hercules, MD  sevelamer carbonate (RENVELA) 2.4 g PACK Take 2.4 g by mouth 3 (three) times daily with meals. 03/06/22   Setzer, Lynnell Jude, PA-C  traZODone (DESYREL) 50 MG tablet Take 25-50 mg by mouth at bedtime. 03/19/23   [provider]      Allergies    Shrimp [shellfish allergy], Bactroban [mupirocin], Hydrocodone, Chlorhexidine gluconate, Tylenol [acetaminophen], and Zestril [lisinopril]    Review of Systems   Review of Systems  Physical Exam Updated Vital Signs BP (!) 203/113 (BP Location: Left Arm)   Pulse 71   Temp 98 F (36.7 C) (Oral)   Resp (!) 22   Ht 5'  3" (1.6 m)   Wt 48.1 kg   SpO2 100%   BMI 18.78 kg/m  Physical Exam Vitals and nursing note reviewed.  Constitutional:      General: She is not in acute distress.    Appearance: She is well-developed. She is not diaphoretic.  HENT:     Head: Normocephalic and atraumatic.  Eyes:     Pupils: Pupils are equal, round, and reactive to light.  Cardiovascular:     Rate and Rhythm: Normal rate and regular rhythm.     Heart sounds: No murmur heard.    No friction rub. No gallop.  Pulmonary:     Effort: Pulmonary effort is normal.     Breath sounds: No wheezing or rales.  Chest:       Comments: Right tunneled  dialysis catheter with surrounding hematoma.  There is some clots along the catheter itself with multiple sutures.  I removed as much of the clot as I could.  There is some mild oozing around the insertion site to the skin.  This seems to be related to the hematoma at the dialysis catheter. Abdominal:     General: There is no distension.     Palpations: Abdomen is soft.     Tenderness: There is no abdominal tenderness.  Musculoskeletal:        General: No tenderness.     Cervical back: Normal range of motion and neck supple.  Skin:    General: Skin is warm and dry.  Neurological:     Mental Status: She is alert and oriented to person, place, and time.  Psychiatric:        Behavior: Behavior normal.     ED Results / Procedures / Treatments   Labs (all labs ordered are listed, but only abnormal results are displayed) Labs Reviewed - No data to display  EKG None  Radiology DG Chest [facility] 1 View Result Date: 05/28/2023 CLINICAL DATA:  Bleeding from dialysis catheter site, initial encounter EXAM: PORTABLE CHEST 1 VIEW COMPARISON:  05/06/2023 FINDINGS: Cardiac shadow is enlarged. Postsurgical changes are again noted. Right jugular dialysis catheter is noted in satisfactory position. No complicating factors are seen. Lungs are clear. No bony abnormality is noted. IMPRESSION: No active disease. Electronically Signed   By: Alcide Clever M.D.   On: 05/28/2023 20:58    Procedures .Laceration Repair  Date/Time: 05/29/2023 7:59 AM  Performed by: Melene Plan, DO Authorized by: Melene Plan, DO   Laceration details:    Length (cm):  0.5 Comments:     Patient has a fairly large hematoma at the insertion site of her tunneled catheter.  I am able to express some clots out of the area.  Bleeding seems controlled after that and some direct pressure.  Applied some quick clot over the area.  Observed for about 30 minutes without any worsening.       Medications Ordered in ED Medications   oxyCODONE-acetaminophen (PERCOCET/ROXICET) 5-325 MG per tablet 2 tablet (has no administration in time range)    ED Course/ Medical Decision Making/ A&P                                 Medical Decision Making Risk Prescription drug management.   72 yo F with a chief complaints of bleeding from her newly placed dialysis catheter.  Patient is on Eliquis.  She has significant pain to any stimulus throughout her whole body.  Suspect this is likely why she is having continued bleeding as there is likely less pressure held on the site after placement and she is also on a blood thinner.  I was able to express some of the hematoma else.  After that pressure was relieved she had no significant oozing from the site.  Will place some quick clot over.  Fresh dressing.  She was observed in the ED for about 30 minutes without worsening.  Will discharge back home.  Have her keep the dressing on until she has dialysis performed.  8:01 AM:  I have discussed the diagnosis/risks/treatment options with the patient.  Evaluation and diagnostic testing in the emergency department does not suggest an emergent condition requiring admission or immediate intervention beyond what has been performed at this time.  They will follow up with PCP. We also discussed returning to the ED immediately if new or worsening sx occur. We discussed the sx which are most concerning (e.g., sudden worsening pain, fever, inability to tolerate by mouth) that necessitate immediate return. Medications administered to the patient during their visit and any new prescriptions provided to the patient are listed below.  Medications given during this visit Medications  oxyCODONE-acetaminophen (PERCOCET/ROXICET) 5-325 MG per tablet 2 tablet (has no administration in time range)     The patient appears reasonably screen and/or stabilized for discharge and I doubt any other medical condition or other [facility] requiring further screening, evaluation, or  treatment in the ED at this time prior to discharge.          Final Clinical Impression(s) / ED Diagnoses Final diagnoses:  Central line complication, subsequent encounter    Rx / DC Orders ED Discharge Orders     None         Melene Plan, DO 05/29/23 0801

## 2023-05-29 NOTE — ED Triage Notes (Signed)
Pt bib ems from home c/o bleeding at right upper chest catheter site. The catheter was placed earlier in month. Pt is on Eliquis. The site started bleeding yesterday and was seen in the ER and d/c. Pt returned because the site has started bleeding again. Pt denies injuring area.   HD T/TH/Sat\  BP 186/125 HR 78 RA 99%

## 2023-05-29 NOTE — ED Notes (Signed)
Dr. Adela Lank at bedside assessing pt site. Pt bleeding controlled. Pt catheter site had congealed blood which EDP removed.

## 2023-05-29 NOTE — ED Notes (Signed)
RN cleaned site and applied quick clot with adhesive guaze. Pt tolerated well and understands the purpose of quick clot.

## 2023-05-31 ENCOUNTER — Encounter (HOSPITAL_COMMUNITY): Payer: Self-pay | Admitting: Nephrology

## 2023-06-02 ENCOUNTER — Emergency Department (HOSPITAL_COMMUNITY): Payer: Medicare Other

## 2023-06-02 ENCOUNTER — Inpatient Hospital Stay (HOSPITAL_COMMUNITY)
Admission: EM | Admit: 2023-06-02 | Discharge: 2023-06-06 | DRG: 193 | Disposition: A | Discharge status: Home / Self Care | Payer: Medicare Other | Attending: Internal Medicine | Admitting: Internal Medicine

## 2023-06-02 ENCOUNTER — Other Ambulatory Visit: Payer: Self-pay

## 2023-06-02 DIAGNOSIS — J181 Lobar pneumonia, unspecified organism: Secondary | ICD-10-CM | POA: Diagnosis present

## 2023-06-02 DIAGNOSIS — I480 Paroxysmal atrial fibrillation: Secondary | ICD-10-CM | POA: Diagnosis present

## 2023-06-02 DIAGNOSIS — Z853 Personal history of malignant neoplasm of breast: Secondary | ICD-10-CM

## 2023-06-02 DIAGNOSIS — I7100 Dissection of unspecified site of aorta: Secondary | ICD-10-CM | POA: Diagnosis present

## 2023-06-02 DIAGNOSIS — J440 Chronic obstructive pulmonary disease with acute lower respiratory infection: Secondary | ICD-10-CM | POA: Diagnosis present

## 2023-06-02 DIAGNOSIS — I4892 Unspecified atrial flutter: Secondary | ICD-10-CM

## 2023-06-02 DIAGNOSIS — Z1152 Encounter for screening for COVID-19: Secondary | ICD-10-CM

## 2023-06-02 DIAGNOSIS — Z886 Allergy status to analgesic agent status: Secondary | ICD-10-CM

## 2023-06-02 DIAGNOSIS — Z79899 Other long term (current) drug therapy: Secondary | ICD-10-CM | POA: Diagnosis not present

## 2023-06-02 DIAGNOSIS — Y95 Nosocomial condition: Secondary | ICD-10-CM | POA: Diagnosis present

## 2023-06-02 DIAGNOSIS — Z91199 Patient's noncompliance with other medical treatment and regimen due to unspecified reason: Secondary | ICD-10-CM

## 2023-06-02 DIAGNOSIS — D631 Anemia in chronic kidney disease: Secondary | ICD-10-CM | POA: Diagnosis present

## 2023-06-02 DIAGNOSIS — Z823 Family history of stroke: Secondary | ICD-10-CM

## 2023-06-02 DIAGNOSIS — Z8679 Personal history of other diseases of the circulatory system: Secondary | ICD-10-CM | POA: Diagnosis not present

## 2023-06-02 DIAGNOSIS — Z91148 Patient's other noncompliance with medication regimen for other reason: Secondary | ICD-10-CM | POA: Diagnosis not present

## 2023-06-02 DIAGNOSIS — I071 Rheumatic tricuspid insufficiency: Secondary | ICD-10-CM

## 2023-06-02 DIAGNOSIS — E7800 Pure hypercholesterolemia, unspecified: Secondary | ICD-10-CM | POA: Diagnosis present

## 2023-06-02 DIAGNOSIS — Z992 Dependence on renal dialysis: Secondary | ICD-10-CM

## 2023-06-02 DIAGNOSIS — N2581 Secondary hyperparathyroidism of renal origin: Secondary | ICD-10-CM | POA: Diagnosis present

## 2023-06-02 DIAGNOSIS — F1411 Cocaine abuse, in remission: Secondary | ICD-10-CM

## 2023-06-02 DIAGNOSIS — I10 Essential (primary) hypertension: Secondary | ICD-10-CM

## 2023-06-02 DIAGNOSIS — I4719 Other supraventricular tachycardia: Secondary | ICD-10-CM | POA: Diagnosis not present

## 2023-06-02 DIAGNOSIS — E872 Acidosis, unspecified: Secondary | ICD-10-CM | POA: Diagnosis present

## 2023-06-02 DIAGNOSIS — I69351 Hemiplegia and hemiparesis following cerebral infarction affecting right dominant side: Secondary | ICD-10-CM | POA: Diagnosis not present

## 2023-06-02 DIAGNOSIS — I2510 Atherosclerotic heart disease of native coronary artery without angina pectoris: Secondary | ICD-10-CM | POA: Diagnosis present

## 2023-06-02 DIAGNOSIS — I132 Hypertensive heart and chronic kidney disease with heart failure and with stage 5 chronic kidney disease, or end stage renal disease: Secondary | ICD-10-CM | POA: Diagnosis present

## 2023-06-02 DIAGNOSIS — E875 Hyperkalemia: Secondary | ICD-10-CM | POA: Diagnosis present

## 2023-06-02 DIAGNOSIS — Z833 Family history of diabetes mellitus: Secondary | ICD-10-CM

## 2023-06-02 DIAGNOSIS — I4510 Unspecified right bundle-branch block: Secondary | ICD-10-CM | POA: Diagnosis present

## 2023-06-02 DIAGNOSIS — J9601 Acute respiratory failure with hypoxia: Secondary | ICD-10-CM | POA: Diagnosis present

## 2023-06-02 DIAGNOSIS — I5022 Chronic systolic (congestive) heart failure: Secondary | ICD-10-CM | POA: Diagnosis present

## 2023-06-02 DIAGNOSIS — I161 Hypertensive emergency: Secondary | ICD-10-CM | POA: Diagnosis present

## 2023-06-02 DIAGNOSIS — Z8249 Family history of ischemic heart disease and other diseases of the circulatory system: Secondary | ICD-10-CM

## 2023-06-02 DIAGNOSIS — I5021 Acute systolic (congestive) heart failure: Secondary | ICD-10-CM | POA: Diagnosis not present

## 2023-06-02 DIAGNOSIS — R7989 Other specified abnormal findings of blood chemistry: Secondary | ICD-10-CM

## 2023-06-02 DIAGNOSIS — N186 End stage renal disease: Secondary | ICD-10-CM

## 2023-06-02 DIAGNOSIS — Z7901 Long term (current) use of anticoagulants: Secondary | ICD-10-CM

## 2023-06-02 DIAGNOSIS — I483 Typical atrial flutter: Secondary | ICD-10-CM

## 2023-06-02 DIAGNOSIS — J189 Pneumonia, unspecified organism: Secondary | ICD-10-CM | POA: Diagnosis not present

## 2023-06-02 DIAGNOSIS — I2489 Other forms of acute ischemic heart disease: Secondary | ICD-10-CM | POA: Diagnosis present

## 2023-06-02 DIAGNOSIS — R54 Age-related physical debility: Secondary | ICD-10-CM | POA: Diagnosis present

## 2023-06-02 DIAGNOSIS — Z87891 Personal history of nicotine dependence: Secondary | ICD-10-CM

## 2023-06-02 DIAGNOSIS — I255 Ischemic cardiomyopathy: Secondary | ICD-10-CM | POA: Diagnosis present

## 2023-06-02 DIAGNOSIS — R0603 Acute respiratory distress: Secondary | ICD-10-CM | POA: Diagnosis present

## 2023-06-02 DIAGNOSIS — Z981 Arthrodesis status: Secondary | ICD-10-CM

## 2023-06-02 LAB — CBC WITH DIFFERENTIAL/PLATELET
Abs Immature Granulocytes: 0.02 10*3/uL (ref 0.00–0.07)
Basophils Absolute: 0 10*3/uL (ref 0.0–0.1)
Basophils Relative: 1 %
Eosinophils Absolute: 0.1 10*3/uL (ref 0.0–0.5)
Eosinophils Relative: 1 %
HCT: 45.5 % (ref 36.0–46.0)
Hemoglobin: 14.1 g/dL (ref 12.0–15.0)
Immature Granulocytes: 0 %
Lymphocytes Relative: 7 %
Lymphs Abs: 0.5 10*3/uL — ABNORMAL LOW (ref 0.7–4.0)
MCH: 29.7 pg (ref 26.0–34.0)
MCHC: 31 g/dL (ref 30.0–36.0)
MCV: 96 fL (ref 80.0–100.0)
Monocytes Absolute: 0.7 10*3/uL (ref 0.1–1.0)
Monocytes Relative: 10 %
Neutro Abs: 5.2 10*3/uL (ref 1.7–7.7)
Neutrophils Relative %: 81 %
Platelets: 245 10*3/uL (ref 150–400)
RBC: 4.74 MIL/uL (ref 3.87–5.11)
RDW: 18.4 % — ABNORMAL HIGH (ref 11.5–15.5)
WBC: 6.4 10*3/uL (ref 4.0–10.5)
nRBC: 0 % (ref 0.0–0.2)

## 2023-06-02 LAB — RENAL FUNCTION PANEL
Albumin: 3.4 g/dL — ABNORMAL LOW (ref 3.5–5.0)
Anion gap: 17 — ABNORMAL HIGH (ref 5–15)
BUN: 51 mg/dL — ABNORMAL HIGH (ref 8–23)
CO2: 23 mmol/L (ref 22–32)
Calcium: 8.9 mg/dL (ref 8.9–10.3)
Chloride: 95 mmol/L — ABNORMAL LOW (ref 98–111)
Creatinine, Ser: 8.94 mg/dL — ABNORMAL HIGH (ref 0.44–1.00)
GFR, Estimated: 4 mL/min — ABNORMAL LOW (ref >60)
Glucose, Bld: 68 mg/dL — ABNORMAL LOW (ref 70–99)
Phosphorus: 6.5 mg/dL — ABNORMAL HIGH (ref 2.5–4.6)
Potassium: 6.2 mmol/L — ABNORMAL HIGH (ref 3.5–5.1)
Sodium: 135 mmol/L (ref 135–145)

## 2023-06-02 LAB — TROPONIN I (HIGH SENSITIVITY)
Troponin I (High Sensitivity): 284 ng/L (ref <18)
Troponin I (High Sensitivity): 1476 ng/L (ref <18)

## 2023-06-02 LAB — COMPREHENSIVE METABOLIC PANEL
ALT: 10 U/L (ref 0–44)
AST: 18 U/L (ref 15–41)
Albumin: 4.3 g/dL (ref 3.5–5.0)
Alkaline Phosphatase: 72 U/L (ref 38–126)
Anion gap: 22 — ABNORMAL HIGH (ref 5–15)
BUN: 124 mg/dL — ABNORMAL HIGH (ref 8–23)
CO2: 18 mmol/L — ABNORMAL LOW (ref 22–32)
Calcium: 9.6 mg/dL (ref 8.9–10.3)
Chloride: 96 mmol/L — ABNORMAL LOW (ref 98–111)
Creatinine, Ser: 16.81 mg/dL — ABNORMAL HIGH (ref 0.44–1.00)
GFR, Estimated: 2 mL/min — ABNORMAL LOW (ref >60)
Glucose, Bld: 99 mg/dL (ref 70–99)
Potassium: >7.5 mmol/L (ref 3.5–5.1)
Sodium: 136 mmol/L (ref 135–145)
Total Bilirubin: 0.9 mg/dL (ref 0.0–1.2)
Total Protein: 8 g/dL (ref 6.5–8.1)

## 2023-06-02 LAB — RESP PANEL BY RT-PCR (RSV, FLU A&B, COVID)  RVPGX2
Influenza A by PCR: NEGATIVE
Influenza B by PCR: NEGATIVE
Resp Syncytial Virus by PCR: NEGATIVE
SARS Coronavirus 2 by RT PCR: NEGATIVE

## 2023-06-02 LAB — PHOSPHORUS: Phosphorus: 7.2 mg/dL — ABNORMAL HIGH (ref 2.5–4.6)

## 2023-06-02 LAB — PROTIME-INR
INR: 1.1 (ref 0.8–1.2)
Prothrombin Time: 14.7 s (ref 11.4–15.2)

## 2023-06-02 LAB — MAGNESIUM: Magnesium: 2.1 mg/dL (ref 1.7–2.4)

## 2023-06-02 LAB — APTT: aPTT: 40 s — ABNORMAL HIGH (ref 24–36)

## 2023-06-02 LAB — I-STAT CG4 LACTIC ACID, ED: Lactic Acid, Venous: 1.1 mmol/L (ref 0.5–1.9)

## 2023-06-02 LAB — HEPATITIS B SURFACE ANTIGEN: Hepatitis B Surface Ag: NONREACTIVE

## 2023-06-02 MED ORDER — VANCOMYCIN HCL IN DEXTROSE 1-5 GM/200ML-% IV SOLN
1000.0000 mg | Freq: Once | INTRAVENOUS | Status: AC
  Administered 2023-06-02: 1000 mg via INTRAVENOUS
  Filled 2023-06-02: qty 200

## 2023-06-02 MED ORDER — SODIUM ZIRCONIUM CYCLOSILICATE 10 G PO PACK
10.0000 g | PACK | Freq: Every day | ORAL | Status: DC
  Administered 2023-06-02: 10 g via ORAL
  Filled 2023-06-02 (×2): qty 1

## 2023-06-02 MED ORDER — PANTOPRAZOLE SODIUM 40 MG PO TBEC
40.0000 mg | DELAYED_RELEASE_TABLET | Freq: Two times a day (BID) | ORAL | Status: DC
  Administered 2023-06-02 – 2023-06-06 (×8): 40 mg via ORAL
  Filled 2023-06-02 (×8): qty 1

## 2023-06-02 MED ORDER — METRONIDAZOLE 500 MG/100ML IV SOLN
500.0000 mg | Freq: Once | INTRAVENOUS | Status: AC
  Administered 2023-06-02: 500 mg via INTRAVENOUS
  Filled 2023-06-02: qty 100

## 2023-06-02 MED ORDER — METOPROLOL TARTRATE 5 MG/5ML IV SOLN: 5.0000 mg | Freq: Four times a day (QID) | INTRAVENOUS | Status: DC | PRN

## 2023-06-02 MED ORDER — LACTATED RINGERS IV SOLN: INTRAVENOUS | Status: DC

## 2023-06-02 MED ORDER — SODIUM CHLORIDE 0.9 % IV SOLN
2.0000 g | Freq: Once | INTRAVENOUS | Status: AC
  Administered 2023-06-02: 2 g via INTRAVENOUS
  Filled 2023-06-02: qty 12.5

## 2023-06-02 MED ORDER — ACETAMINOPHEN 650 MG RE SUPP: 650.0000 mg | Freq: Four times a day (QID) | RECTAL | Status: DC | PRN

## 2023-06-02 MED ORDER — HYDRALAZINE HCL 50 MG PO TABS
100.0000 mg | ORAL_TABLET | Freq: Three times a day (TID) | ORAL | Status: DC
  Administered 2023-06-02 – 2023-06-06 (×12): 100 mg via ORAL
  Filled 2023-06-02 (×12): qty 2

## 2023-06-02 MED ORDER — ROSUVASTATIN CALCIUM 5 MG PO TABS
10.0000 mg | ORAL_TABLET | Freq: Every day | ORAL | Status: DC
  Administered 2023-06-02 – 2023-06-06 (×5): 10 mg via ORAL
  Filled 2023-06-02 (×5): qty 2

## 2023-06-02 MED ORDER — SODIUM CHLORIDE 0.9 % IV BOLUS (SEPSIS)
500.0000 mL | Freq: Once | INTRAVENOUS | Status: AC
  Administered 2023-06-02: 500 mL via INTRAVENOUS

## 2023-06-02 MED ORDER — SENNOSIDES-DOCUSATE SODIUM 8.6-50 MG PO TABS: 1.0000 | ORAL_TABLET | Freq: Two times a day (BID) | ORAL | Status: DC | PRN

## 2023-06-02 MED ORDER — INSULIN ASPART 100 UNIT/ML IV SOLN
5.0000 [IU] | Freq: Once | INTRAVENOUS | Status: AC
  Administered 2023-06-02: 5 [IU] via INTRAVENOUS

## 2023-06-02 MED ORDER — AMLODIPINE BESYLATE 10 MG PO TABS
10.0000 mg | ORAL_TABLET | Freq: Every day | ORAL | Status: DC
  Administered 2023-06-02 – 2023-06-06 (×5): 10 mg via ORAL
  Filled 2023-06-02 (×5): qty 1

## 2023-06-02 MED ORDER — ONDANSETRON HCL 4 MG PO TABS: 4.0000 mg | ORAL_TABLET | Freq: Four times a day (QID) | ORAL | Status: DC | PRN

## 2023-06-02 MED ORDER — SODIUM CHLORIDE 0.9 % IV SOLN
1.0000 g | Freq: Every day | INTRAVENOUS | Status: DC
  Filled 2023-06-02: qty 10

## 2023-06-02 MED ORDER — CLONIDINE HCL 0.1 MG PO TABS
0.1000 mg | ORAL_TABLET | Freq: Two times a day (BID) | ORAL | Status: DC
  Administered 2023-06-02 – 2023-06-06 (×8): 0.1 mg via ORAL
  Filled 2023-06-02 (×9): qty 1

## 2023-06-02 MED ORDER — SODIUM BICARBONATE 8.4 % IV SOLN
50.0000 meq | Freq: Once | INTRAVENOUS | Status: AC
  Administered 2023-06-02: 50 meq via INTRAVENOUS
  Filled 2023-06-02: qty 50

## 2023-06-02 MED ORDER — ACETAMINOPHEN 325 MG PO TABS
650.0000 mg | ORAL_TABLET | Freq: Four times a day (QID) | ORAL | Status: DC | PRN
  Administered 2023-06-03 – 2023-06-06 (×5): 650 mg via ORAL
  Filled 2023-06-02 (×5): qty 2

## 2023-06-02 MED ORDER — CARVEDILOL 25 MG PO TABS
25.0000 mg | ORAL_TABLET | Freq: Two times a day (BID) | ORAL | Status: DC
Start: 2023-06-02 — End: 2023-06-06
  Administered 2023-06-02 – 2023-06-06 (×7): 25 mg via ORAL
  Filled 2023-06-02 (×7): qty 1

## 2023-06-02 MED ORDER — ISOSORBIDE MONONITRATE ER 30 MG PO TB24
30.0000 mg | ORAL_TABLET | ORAL | Status: DC
  Administered 2023-06-03 – 2023-06-05 (×2): 30 mg via ORAL
  Filled 2023-06-02: qty 1

## 2023-06-02 MED ORDER — VITAMIN D 25 MCG (1000 UNIT) PO TABS
ORAL_TABLET | Freq: Every day | ORAL | Status: DC
  Administered 2023-06-03 – 2023-06-06 (×4): 1000 [IU] via ORAL
  Filled 2023-06-02 (×5): qty 1

## 2023-06-02 MED ORDER — APIXABAN 2.5 MG PO TABS: 2.5000 mg | ORAL_TABLET | Freq: Two times a day (BID) | ORAL | Status: DC

## 2023-06-02 MED ORDER — ISOSORBIDE MONONITRATE ER 30 MG PO TB24: 30.0000 mg | ORAL_TABLET | Freq: Every day | ORAL | Status: DC

## 2023-06-02 MED ORDER — APIXABAN 2.5 MG PO TABS
2.5000 mg | ORAL_TABLET | Freq: Two times a day (BID) | ORAL | Status: DC
  Administered 2023-06-02 – 2023-06-06 (×8): 2.5 mg via ORAL
  Filled 2023-06-02 (×9): qty 1

## 2023-06-02 MED ORDER — DEXTROSE 50 % IV SOLN
1.0000 | Freq: Once | INTRAVENOUS | Status: AC
  Administered 2023-06-02: 50 mL via INTRAVENOUS
  Filled 2023-06-02: qty 50

## 2023-06-02 MED ORDER — ONDANSETRON HCL 4 MG/2ML IJ SOLN: 4.0000 mg | Freq: Four times a day (QID) | INTRAMUSCULAR | Status: DC | PRN

## 2023-06-02 MED ORDER — ISOSORBIDE MONONITRATE ER 60 MG PO TB24
60.0000 mg | ORAL_TABLET | ORAL | Status: DC
  Administered 2023-06-04 – 2023-06-06 (×2): 60 mg via ORAL
  Filled 2023-06-02 (×2): qty 1

## 2023-06-02 MED ORDER — ALBUTEROL SULFATE (2.5 MG/3ML) 0.083% IN NEBU
10.0000 mg | INHALATION_SOLUTION | Freq: Once | RESPIRATORY_TRACT | Status: AC
  Administered 2023-06-02: 10 mg via RESPIRATORY_TRACT
  Filled 2023-06-02: qty 12

## 2023-06-02 MED ORDER — VANCOMYCIN HCL 500 MG/100ML IV SOLN
500.0000 mg | INTRAVENOUS | Status: DC
  Filled 2023-06-02: qty 100

## 2023-06-02 MED ORDER — CALCIUM GLUCONATE 10 % IV SOLN
1.0000 g | Freq: Once | INTRAVENOUS | Status: AC
  Administered 2023-06-02: 1 g via INTRAVENOUS
  Filled 2023-06-02: qty 10

## 2023-06-02 NOTE — Progress Notes (Signed)
   06/02/23 0700  Vitals  Temp 97.8 F (36.6 C)  Temp Source Oral  BP (!) 145/97  MAP (mmHg) 112  BP Location Left Arm  BP Method Automatic  Patient Position (if appropriate) Lying  Pulse Rate Source Monitor  ECG Heart Rate (!) 149  Resp 17  During Treatment Monitoring  Blood Flow Rate (mL/min) 0 mL/min  Arterial Pressure (mmHg) -1.01 mmHg  Venous Pressure (mmHg) -0.8 mmHg  TMP (mmHg) -50.3 mmHg  Ultrafiltration Rate (mL/min) 0 mL/min  Dialysate Flow Rate (mL/min) 299 ml/min  Duration of HD Treatment -hour(s) 3.24 hour(s)  Cumulative Fluid Removed (mL) per Treatment  2749.96  HD Safety Checks Performed Yes  Intra-Hemodialysis Comments Progressing as prescribed  Post Treatment  Dialyzer Clearance Lightly streaked  Liters Processed 67.4  Fluid Removed (mL) 2700 mL  Tolerated HD Treatment Yes  Hemodialysis Catheter Right Subclavian Double lumen Permanent (Tunneled)  Placement Date/Time: 05/28/23 0942   Placed prior to admission: No  Serial / Lot #: [id number]  Expiration Date: 04/01/27  Time Out: Correct patient;Correct site;Correct procedure  Maximum sterile barrier precautions: Sterile gloves;Sterile gown;Mask;C...  Red Lumen Status Heparin  locked  Purple Lumen Status Heparin  locked  Catheter fill solution Heparin  1000 units/ml  Catheter fill volume (Arterial) 1.9 cc  Catheter fill volume (Venous) 1.9  Dressing Type Gauze/Drain sponge  Dressing Status Clean, Dry, Intact  Drainage Description None  Post treatment catheter status Capped and Clamped   Due to rapid heart rate 140s to 150s treatment discontinued,215ml n. Saline bolus. Pt is without symptoms, 3hours 14 minutes dialysis  for hyperkelemia. Doctor on call notified. Pt return to ED stable.

## 2023-06-02 NOTE — Sepsis Progress Note (Signed)
 Following for sepsis monitoring ?

## 2023-06-02 NOTE — Progress Notes (Signed)
  Carryover admission to the Day Admitter.  I discussed this case with the EDP, Lamar Schlossman, PA.  Per these discussions:   This is a 73 year old female with end-stage renal disease on hemodialysis (T,Th,Sat), who is being admitted with acute hypoxic respiratory failure in the setting of suspected acute volume overload as well as finding of hyperkalemia, after presenting with 1 day of progressive shortness of breath.  EMS found patient's oxygen  saturation to be in the low 80s on room air, relative to no known baseline supplemental oxygen  requirement.  Was started on nonrebreather and brought to [facility] ED.  Ensuing labs were also notable for hyperkalemia, with potassium level reported to be greater than 7.5.  She was treated with albuterol , calcium  gluconate, Lokelma , insulin /D50.   EDP d/w on-call nephrology, Dr. Marlee, who will consult and is taking the patient for emergent hemodialysis that at this time.   I have placed an order for inpatient admission to PCU for further evaluation and management of the above.  I have placed some additional preliminary admit orders via the adult multi-morbid admission order set.     Eva Pore, DO Hospitalist

## 2023-06-02 NOTE — Progress Notes (Signed)
 Pharmacy Antibiotic Note  Monique Henderson is a 73 y.o. female admitted on 06/02/2023 with pneumonia.  Pharmacy has been consulted for vancomycin  and cefepime  dosing. Patient is ESRD T,Th, Sa  Plan: Vancomycin  500mg  IV on HD days Cefepime  1G IV Q evening Monitor clinical progression and levels as appropriate     Temp (24hrs), Avg:98.3 F (36.8 C), Min:97.8 F (36.6 C), Max:98.8 F (37.1 C)  Recent Labs  Lab 05/28/23 2056 06/02/23 0034 06/02/23 0042  WBC 3.7* 6.4  --   CREATININE 8.86* 16.81*  --   LATICACIDVEN  --   --  1.1    Estimated Creatinine Clearance: 2.4 mL/min (A) (by C-G formula based on SCr of 16.81 mg/dL (H)).    Allergies  Allergen Reactions   Shrimp [Shellfish Allergy] Shortness Of Breath   Bactroban  [Mupirocin ] Other (See Comments)    Sores in nose   Hydrocodone  Itching, Nausea Only and Nausea And Vomiting   Chlorhexidine  Gluconate Itching   Tylenol  [Acetaminophen ] Itching    Takes Percocet at home 05/06/23   Zestril  [Lisinopril ] Cough    Antimicrobials this admission: Vancomycin  1/1>> Cefepime  1/1>>  Thank you for allowing pharmacy to be a part of this patient's care.  Koren CROME Selim Durden 06/02/2023 10:39 AM

## 2023-06-02 NOTE — ED Provider Notes (Signed)
 [facility] Emergency Department Provider Note MRN:  [id number]  Arrival date & time: 06/02/23     Chief Complaint   Code Sepsis, Shortness of Breath, and Altered Mental Status   History of Present Illness   Monique Henderson is a 73 y.o. year-old female presents to the ED with chief complaint of respiratory distress and AMS.  Reportedly EMS called out for respiratory distress.  Was found to be 80% on RA with EMS.  Was placed on NRB mask and O2 improved to 100%.  Now on 5L NRB.  Patient unable to clearly say when last dialysis was.  I called patient's emergency contact, who expresses concern about potentially missed dialysis.  History provided by patient.   Review of Systems  Pertinent positive and negative review of systems noted in HPI.    Physical Exam   Vitals:   06/02/23 0200 06/02/23 0230  BP: (!) 162/101 (!) 182/109  Pulse: 73 68  Resp: 18 14  Temp:    SpO2: 92% 95%    CONSTITUTIONAL:  ill-appearing NEURO:  confused, GCS 13 EYES:  eyes equal and reactive ENT/NECK:  Supple, no stridor  CARDIO:  normal rate, regular rhythm, appears well-perfused  PULM:  moderate respiratory distress, increased WOB, accessory muscle use GI/GU:  non-distended,  MSK/SPINE:  No gross deformities, no edema, moves all extremities  SKIN:  no rash, atraumatic   *Additional and/or pertinent findings included in MDM below  Diagnostic and Interventional Summary    EKG Interpretation Date/Time:  Wednesday June 02 2023 00:43:35 EST Ventricular Rate:  74 PR Interval:    QRS Duration:  216 QT Interval:  554 QTC Calculation: 615 R Axis:   -86  Text Interpretation: Accelerated junctional rhythm Right bundle branch block QRS wider than prior EKG Confirmed by Haze Lonni PARAS [phone]) on 06/02/2023 12:46:58 AM       Labs Reviewed  COMPREHENSIVE METABOLIC PANEL - Abnormal; Notable for the following components:      Result Value   Potassium >7.5 (*)     Chloride 96 (*)    CO2 18 (*)    BUN 124 (*)    Creatinine, Ser 16.81 (*)    GFR, Estimated 2 (*)    Anion gap 22 (*)    All other components within normal limits  CBC WITH DIFFERENTIAL/PLATELET - Abnormal; Notable for the following components:   RDW 18.4 (*)    Lymphs Abs 0.5 (*)    All other components within normal limits  APTT - Abnormal; Notable for the following components:   aPTT 40 (*)    All other components within normal limits  RESP PANEL BY RT-PCR (RSV, FLU A&B, COVID)  RVPGX2  CULTURE, BLOOD (ROUTINE X 2)  CULTURE, BLOOD (ROUTINE X 2)  PROTIME-INR  URINALYSIS, W/ REFLEX TO CULTURE (INFECTION SUSPECTED)  HEPATITIS B SURFACE ANTIGEN  HEPATITIS B SURFACE ANTIBODY, QUANTITATIVE  I-STAT CG4 LACTIC ACID, ED  I-STAT CHEM 8, ED  I-STAT CG4 LACTIC ACID, ED    CT HEAD WO CONTRAST ( )  Final Result    DG Chest Port 1 View  Final Result      Medications  lactated ringers  infusion ( Intravenous New Bag/Given 06/02/23 0113)  metroNIDAZOLE  (FLAGYL ) IVPB 500 mg (500 mg Intravenous New Bag/Given 06/02/23 0159)  vancomycin  (VANCOCIN ) IVPB 1000 mg/200 mL premix (has no administration in time range)  sodium zirconium cyclosilicate  (LOKELMA ) packet 10 g (has no administration in time range)  ceFEPIme  (MAXIPIME ) 2 g in sodium chloride   0.9 % 100 mL IVPB (0 g Intravenous Stopped 06/02/23 0159)  sodium chloride  0.9 % bolus 500 mL (0 mLs Intravenous Stopped 06/02/23 0230)  calcium  gluconate inj 10% (1 g) URGENT USE ONLY! (1 g Intravenous Given 06/02/23 0119)  albuterol  (PROVENTIL ) (2.5 MG/3ML) 0.083% nebulizer solution 10 mg (10 mg Nebulization Given 06/02/23 0119)  sodium bicarbonate  injection 50 mEq (50 mEq Intravenous Given 06/02/23 0124)  insulin  aspart (novoLOG ) injection 5 Units (5 Units Intravenous Given 06/02/23 0119)    And  dextrose  50 % solution 50 mL (50 mLs Intravenous Given 06/02/23 0121)     Procedures  /  Critical Care .Critical Care  Performed by: Vicky Charleston,  PA-C Authorized by: Vicky Charleston, PA-C   Critical care provider statement:    Critical care time (minutes):  51   Critical care was necessary to treat or prevent imminent or life-threatening deterioration of the following conditions:  Respiratory failure and metabolic crisis   Critical care was time spent personally by me on the following activities:  Development of treatment plan with patient or surrogate, discussions with consultants, evaluation of patient's response to treatment, examination of patient, ordering and review of laboratory studies, ordering and review of radiographic studies, ordering and performing treatments and interventions, pulse oximetry, re-evaluation of patient's condition and review of old charts   ED Course and Medical Decision Making  I have reviewed the triage vital signs, the nursing notes, and pertinent available records from the EMR.  Social Determinants Affecting Complexity of Care: Patient has no clinically significant social determinants affecting this chief complaint..   ED Course: Clinical Course as of 06/02/23 0256  Wed Jun 02, 2023  0217 Comprehensive metabolic panel(!!) K of 7.5, will treat with calcium , insulin , albuterol , lokelma .  Will consult nephro. [RB]  0218 Resp panel by RT-PCR (RSV, Flu A&B, Covid) Anterior Nasal Swab Covid and flu negative [RB]  0218 I-Stat Lactic Acid, ED Normal lactic [RB]  0218 DG Chest Port 1 View Questionable right lower opacity, getting abx [RB]    Clinical Course User Index [RB] Vicky Charleston, PA-C    Medical Decision Making Patient her with respiratory distress.  On dialysis.  Last dialyzed unclear, possibly last Thursday.  Will check labs and reassess.  Labs concerning for hyperK.  Will start treatment here.  Will likely need emergent dialysis.    Amount and/or Complexity of Data Reviewed Labs: ordered. Decision-making details documented in ED Course. Radiology: ordered and independent  interpretation performed. Decision-making details documented in ED Course.  Risk OTC drugs. Prescription drug management. Decision regarding hospitalization.         Consultants: I consulted with Dr. Marlee, who is appreciated for coming to take patient to dialysis. I consulted with Dr. Marcene, who is appreciated for admitting.   Treatment and Plan: Patient's exam and diagnostic results are concerning for respiratory distress and hyperkalemia.  Feel that patient will need admission to the hospital for further treatment and evaluation.    Final Clinical Impressions(s) / ED Diagnoses     ICD-10-CM   1. Acute hypoxic respiratory failure (HCC)  J96.01     2. Hyperkalemia  E87.5       ED Discharge Orders     None         Discharge Instructions Discussed with and Provided to Patient:   Discharge Instructions   None      Vicky Charleston, PA-C 06/02/23 0257    Haze Lonni PARAS, MD 06/03/23 0110

## 2023-06-02 NOTE — H&P (Signed)
 History and Physical    Monique Henderson FMW:[id number] DOB: 08-Feb-1951 DOA: 06/02/2023  PCP: Shelda Atlas, MD  Patient coming from: Home  I have personally briefly reviewed patient's old medical records in [facility]  Chief Complaint: Respiratory distress and confusion  HPI: Monique Henderson is a 73 y.o. female with medical history significant of ESRD on hemodialysis (TTS), A-fib on Eliquis , hypertension, hyperlipidemia, COPD, history of right breast cancer status postlumpectomy in 2014, GERD, hepatitis C treated in 2018, moderate protein calorie malnutrition, history of stroke with right-sided deficit, chronic lower back pain status post lumbar spinal fusion surgery in 2010 presented to ER with altered mental status and respiratory distress.  Patient is not very good historian.  History gathered from chart/ED notes.  Apparently EMS was called by her niece due to respiratory distress and confusion.  EMS noted to have oxygen  saturation of 80% on room air and was placed on NRB and oxygen  saturation improved to 100%.  Upon my evaluation: Patient appears to be sleepy but arousable.  Tells me that she has been compliant with dialysis appointment and her last dialysis was on Thursday  Reports she has been coughing for the past couple of days.  Denies chest pain, shortness of breath, palpitations, fever or chills.  Please note: Patient had exchange of her right IJ TDC on 05/28/23.  Hospitalized on 05/06/2023 with fluid overload/acute on chronic CHF.  ED Course: Upon arrival to ED: Temperature 98.8, pulse 58, RR: 18, BP: 164/128, on nonrebreather 5 L of oxygen .  WBC: 6.4, H&H 14.1/45.5, PLT: 245.  COVID flu RSV all negative.  NA: 136, potassium more than 7.5, creatinine 16.81, GFR 2, anion gap 22.  Lactic acid: WNL.  She was treated with calcium  gluconate, albuterol  and insulin  with dextrose  and sodium bicarb in ED.  CT head negative for any acute finding.  Chest x-ray shows new right basilar  infiltrate.  Patient was started on broad-spectrum antibiotics.  Nephrology consulted for emergency dialysis.  [facility] hospitalist consulted for admission.  Review of Systems: As per HPI otherwise negative.    Past Medical History:  Diagnosis Date   Acute cardioembolic stroke (HCC) 02/26/2022   AF (paroxysmal atrial fibrillation) (HCC) 05/29/2019   on Coumadin    Aortic atherosclerosis (HCC) 07/05/2019   Aortic dissection (HCC) 04/04/2019   s/p repair   Bone spur 2008   Right calcaneal foot spur   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 Deward Null; S/P re-excision of superior and inferior margins 10/27/2012.    Dissection of aorta (HCC) 04/03/2019   ESRD on hemodialysis (HCC) 05/29/2019   Essential hypertension 09/16/2006   GERD 09/16/2006   Hepatitis C    treated and RNA confirmed not detectable 01/2017   Insomnia 03/14/2015   Malnutrition of moderate degree 05/19/2019   Non compliance with medical treatment 12/04/2017   Normocytic anemia    With thrombocytosis   Osteoarthritis    Right ureteral stone 2002   S/P lumbar spinal fusion 01/18/2014   S/P lumbar decompressive laminectomy, fusion,  and plating for lumbar spinal stenosis on 05/27/2009 by Dr. Alm CANDIE Molt.  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 Alm CANDIE Molt.     Stroke (cerebrum) (HCC)    Stroke (HCC) 02/23/2022   SVT (supraventricular tachycardia) (HCC) 08/28/2019   Tobacco use disorder 04/19/2009   Uterine fibroid    Wears dentures    top    Past Surgical History:  Procedure Laterality Date   ANTERIOR LAT LUMBAR FUSION N/A 01/18/2014   Procedure: ANTERIOR LATERAL LUMBAR FUSION LUMBAR TWO-THREE;  Surgeon: Alm GORMAN Molt, MD;  Location: [facility];  Service: Neurosurgery;  Laterality: N/A;   ANTERIOR LUMBAR FUSION  01/18/2014   AV FISTULA PLACEMENT Left 04/20/2019   Procedure: ARTERIOVENOUS (AV) FISTULA CREATION;  Surgeon: Sheree Penne Bruckner, MD;  Location: [facility] OR;  Service: Vascular;  Laterality: Left;   BACK SURGERY     BREAST LUMPECTOMY Left 01/2003   BREAST LUMPECTOMY Right 2014   BREAST LUMPECTOMY WITH NEEDLE LOCALIZATION AND AXILLARY SENTINEL LYMPH NODE BX Right 10/13/2012   Procedure: BREAST LUMPECTOMY WITH NEEDLE LOCALIZATION;  Surgeon: Deward GORMAN Curvin DOUGLAS, MD;  Location: [facility];  Service: General;  Laterality: Right;  Right breast wire localized lumpectomy   DIALYSIS/PERMA CATHETER INSERTION N/A 05/28/2023   Procedure: DIALYSIS/PERMA CATHETER INSERTION;  Surgeon: Melia Lynwood ORN, MD;  Location: [facility];  Service: Cardiovascular;  Laterality: N/A;   INSERTION OF DIALYSIS CATHETER Right 04/20/2019   Procedure: INSERTION OF DIALYSIS CATHETER, right internal jugular;  Surgeon: Sheree Penne Bruckner, MD;  Location: [facility] OR;  Service: Vascular;  Laterality: Right;   INSERTION OF DIALYSIS CATHETER Right 09/24/2020   Procedure: INSERTION OF TUNNELED DIALYSIS CATHETER;  Surgeon: Oris Krystal FALCON, MD;  Location: [facility] OR;  Service: Vascular;  Laterality: Right;   IR FLUORO GUIDE CV LINE RIGHT  09/22/2020   IR THORACENTESIS ASP PLEURAL SPACE W/IMG GUIDE  05/19/2019   IR US  GUIDE VASC ACCESS LEFT  09/22/2020   IR US  GUIDE VASC ACCESS RIGHT  09/22/2020   IR VENOCAVAGRAM SVC  09/22/2020   LAMINECTOMY  05/27/2009    Lumbar decompressive laminectomy, fusion and plating for lumbar spinal stensosis   LIGATION OF ARTERIOVENOUS  FISTULA Left 09/24/2020   Procedure: LIGATION OF LEFT ARM ARTERIOVENOUS  FISTULA;  Surgeon: Oris Krystal FALCON, MD;  Location: [facility] OR;  Service: Vascular;  Laterality: Left;   LUMBAR LAMINECTOMY/DECOMPRESSION MICRODISCECTOMY Left 03/23/2013   Procedure: LUMBAR LAMINECTOMY/DECOMPRESSION MICRODISCECTOMY 1 LEVEL;  Surgeon: Alm GORMAN Molt, MD;  Location: [facility];  Service: Neurosurgery;  Laterality: Left;  LUMBAR LAMINECTOMY/DECOMPRESSION MICRODISCECTOMY 1 LEVEL   MASTECTOMY, PARTIAL Left 02/26/2003   ; S/P re-excision of cranial and lateral margins 04/19/2003.    RE-EXCISION OF BREAST CANCER,SUPERIOR MARGINS Right 10/27/2012   Procedure: RE-EXCISION OF BREAST CANCER,SUPERIOR and inferior MARGINS;  Surgeon: Deward GORMAN Curvin DOUGLAS, MD;  Location: [facility] OR;  Service: General;  Laterality: Right;   RE-EXCISION OF BREAST LUMPECTOMY Left 04/2003   TEE WITHOUT CARDIOVERSION N/A 04/04/2019   Procedure: Transesophageal Echocardiogram (Tee);  Surgeon: German Bartlett PEDLAR, MD;  Location: [facility] OR;  Service: Open Heart Surgery;  Laterality: N/A;   THORACIC AORTIC ANEURYSM REPAIR N/A 04/04/2019   Procedure: THORACIC ASCENDING ANEURYSM REPAIR (AAA)  USING 28 MM X 30 CM HEMASHIELD PLATINUM VASCULAR GRAFT;  Surgeon: German Bartlett PEDLAR, MD;  Location: [facility] OR;  Service: Open Heart Surgery;  Laterality: N/A;     reports that she quit smoking about 3 years ago. Her smoking use included cigarettes. She started smoking about 47 years ago. She has a 11 pack-year smoking history. She has never used smokeless tobacco. She reports that she does not currently use alcohol  after a past usage of about 2.0 standard drinks of alcohol  per week. She reports that she does not currently use drugs after having used the following drugs: Cocaine .  Allergies  Allergen Reactions   Shrimp [Shellfish Allergy] Shortness Of Breath   Bactroban  [Mupirocin ]  Other (See Comments)    Sores in nose   Hydrocodone  Itching, Nausea Only and Nausea And Vomiting   Chlorhexidine  Gluconate Itching   Tylenol  [Acetaminophen ] Itching    Takes Percocet at home 05/06/23   Zestril  [Lisinopril ] Cough    Family History  Problem Relation Age of Onset   Colon cancer Mother 33   Hypertension Mother    Diabetes Sister 88   Hypertension Sister    Diabetes Brother    Hypertension Brother    Diabetes Brother    Hypertension Brother    Kidney disease Son        s/p renal transplant   Hypertension Son    Diabetes Son    Multiple sclerosis Son    Bone cancer Sister 19   Breast cancer Neg Hx    Cervical cancer Neg Hx     Prior to Admission medications   Medication Sig Start Date End Date Taking? Authorizing Provider  albuterol  (PROVENTIL ) (2.5 MG/3ML) 0.083% nebulizer solution Take 2.5 mg by nebulization every 6 (six) hours as needed for wheezing or shortness of breath.    [provider]  albuterol  (VENTOLIN  HFA) 108 (90 Base) MCG/ACT inhaler Inhale 2 puffs into the lungs every 6 (six) hours as needed for wheezing or shortness of breath. 07/30/20   Kip Ade, NP  amLODipine  (NORVASC ) 10 MG tablet Take 1 tablet (10 mg total) by mouth daily. 03/06/22   Setzer, Sandra J, PA-C  apixaban  (ELIQUIS ) 2.5 MG TABS tablet Take 1 tablet (2.5 mg total) by mouth 2 (two) times daily. 03/06/22   Setzer, Sandra J, PA-C  carvedilol  (COREG ) 25 MG tablet Take 1 tablet (25 mg total) by mouth 2 (two) times daily with a meal. 05/01/22 05/07/23  Ghimire, Donalda HERO, MD  Cholecalciferol  (VITAMIN D -3 PO) Take 1 capsule by mouth daily.    [provider]  cloNIDine  (CATAPRES ) 0.1 MG tablet Take 1 tablet (0.1 mg total) by mouth 2 (two) times daily. 12/18/22   Dann Candyce RAMAN, MD  hydrALAZINE  (APRESOLINE ) 100 MG tablet Take 1 tablet (100 mg total) by mouth every 8 (eight) hours. 12/18/22   Dann Candyce RAMAN, MD  isosorbide  mononitrate (IMDUR ) 30 MG 24 hr tablet  TAKE 1 TABLET (30MG ) ON DIALYSIS DAYS AND 2 TABLETS (60 MG) ON NON DIALYSIS DAYS. 01/12/23   Dann Candyce RAMAN, MD  naloxone  (NARCAN ) nasal spray 4 mg/0.1 mL 1 spray as directed. 02/19/22   [provider]  oxyCODONE -acetaminophen  (PERCOCET) 10-325 MG tablet Take 1 tablet by mouth in the morning and at bedtime. 03/12/22   [provider]  pantoprazole  (PROTONIX ) 40 MG tablet TAKE 1 TABLET (40 MG TOTAL) BY MOUTH 2 (TWO) TIMES DAILY. 05/13/23   Tanda Bleacher, MD  rosuvastatin  (CRESTOR ) 10 MG tablet TAKE 1 TABLET (10 MG TOTAL) BY MOUTH DAILY FOR CHOLESTEROL 08/07/22   Tanda Bleacher, MD  senna-docusate (SENOKOT-S) 8.6-50  MG tablet Take 1 tablet by mouth 2 (two) times daily between meals as needed for mild constipation. Patient taking differently: Take 1 tablet by mouth 2 (two) times daily as needed for mild constipation. 08/14/22   Gonfa, Taye T, MD  sevelamer  carbonate (RENVELA ) 2.4 g PACK Take 2.4 g by mouth 3 (three) times daily with meals. 03/06/22   Setzer, Nena PARAS, PA-C  traZODone  (DESYREL ) 50 MG tablet Take 25-50 mg by mouth at bedtime. 03/19/23   [provider]    Physical Exam: Vitals:   06/02/23 0600 06/02/23 0615 06/02/23 0645 06/02/23 0700  BP: (!) 182/118 (!) 238/222 (!) 151/111 (!) 145/97  Pulse:      Resp: 16 14 16 17   Temp:    97.8 F (36.6 C)  TempSrc:    Oral  SpO2:        Constitutional: NAD, calm, comfortable, on nasal cannula, appears very weak, sick and dehydrated, very cachectic Eyes: PERRL, lids and conjunctivae normal ENMT: Mucous membranes are dry. Posterior pharynx clear of any exudate or lesions.Normal dentition.  Neck: normal, supple, no masses, no thyromegaly.  Alysis catheter noted on right upper chest. Respiratory: Decreased breath sound noted on the bases.  No rhonchi or crackles. Cardiovascular: Tachycardic, no murmur.  No leg edema Abdomen: no tenderness, no masses palpated. No hepatosplenomegaly. Bowel sounds positive.   Neurologic: Sleepy but arousable.   Labs on Admission: I have personally reviewed following labs and imaging studies  CBC: Recent Labs  Lab 05/28/23 2056 06/02/23 0034  WBC 3.7* 6.4  NEUTROABS 2.3 5.2  HGB 12.3 14.1  HCT 39.5 45.5  MCV 97.1 96.0  PLT 188 245   Basic Metabolic Panel: Recent Labs  Lab 05/28/23 2056 06/02/23 0034  NA 141 136  K 5.2* >7.5*  CL 100 96*  CO2 23 18*  GLUCOSE 91 99  BUN 48* 124*  CREATININE 8.86* 16.81*  CALCIUM  8.5* 9.6   GFR: Estimated Creatinine Clearance: 2.4 mL/min (A) (by C-G formula based on SCr of 16.81 mg/dL (H)). Liver Function Tests: Recent Labs  Lab 06/02/23 0034  AST 18  ALT 10  ALKPHOS 72  BILITOT 0.9  PROT 8.0  ALBUMIN  4.3   No results for input(s): LIPASE, AMYLASE in the last 168 hours. No results for input(s): AMMONIA in the last 168 hours. Coagulation Profile: Recent Labs  Lab 06/02/23 0034  INR 1.1   Cardiac Enzymes: No results for input(s): CKTOTAL, CKMB, CKMBINDEX, TROPONINI in the last 168 hours. BNP (last 3 results) No results for input(s): PROBNP in the last 8760 hours. HbA1C: No results for input(s): HGBA1C in the last 72 hours. CBG: No results for input(s): GLUCAP in the last 168 hours. Lipid Profile: No results for input(s): CHOL, HDL, LDLCALC, TRIG, CHOLHDL, LDLDIRECT in the last 72 hours. Thyroid  Function Tests: No results for input(s): TSH, T4TOTAL, FREET4, T3FREE, THYROIDAB in the last 72 hours. Anemia Panel: No results for input(s): VITAMINB12, FOLATE, FERRITIN, TIBC, IRON, RETICCTPCT in the last 72 hours. Urine analysis:    Component Value Date/Time   COLORURINE AMBER (A) 12/27/2021 1840   APPEARANCEUR TURBID (A) 12/27/2021 1840   LABSPEC 1.013 12/27/2021 1840   PHURINE 8.0 12/27/2021 1840   GLUCOSEU NEGATIVE 12/27/2021 1840   GLUCOSEU NEG mg/dL 93/98/7988 8055   HGBUR MODERATE (A) 12/27/2021 1840   BILIRUBINUR NEGATIVE  12/27/2021 1840   BILIRUBINUR negative 05/04/2018 0910   KETONESUR NEGATIVE 12/27/2021 1840   PROTEINUR >=300 (A) 12/27/2021 1840   UROBILINOGEN 0.2 05/04/2018 0910  UROBILINOGEN 1 02/14/2014 0946   NITRITE NEGATIVE 12/27/2021 1840   LEUKOCYTESUR LARGE (A) 12/27/2021 1840    Radiological Exams on Admission: DG Chest Port 1 View Result Date: 06/02/2023 CLINICAL DATA:  Possible sepsis, initial encounter EXAM: PORTABLE CHEST 1 VIEW COMPARISON:  07/28/2022 FINDINGS: Cardiac shadow is enlarged but stable. Aortic calcifications and postoperative changes are again seen. Dialysis catheter is again noted on right stable from the prior exam. Lungs are well aerated bilaterally. New right basilar infiltrate is seen. IMPRESSION: New right basilar infiltrate. Electronically Signed   By: Oneil Devonshire M.D.   On: 06/02/2023 01:27   CT HEAD WO CONTRAST ( ) Result Date: 06/02/2023 CLINICAL DATA:  Altered mental status EXAM: CT HEAD WITHOUT CONTRAST TECHNIQUE: Contiguous axial images were obtained from the base of the skull through the vertex without intravenous contrast. RADIATION DOSE REDUCTION: This exam was performed according to the departmental dose-optimization program which includes automated exposure control, adjustment of the mA and/or kV according to patient size and/or use of iterative reconstruction technique. COMPARISON:  02/23/2022 FINDINGS: Brain: No evidence of acute infarction, hemorrhage, hydrocephalus, extra-axial collection or mass lesion/mass effect. Mild atrophic changes and chronic white matter ischemic changes are seen. Findings consistent with prior left parietal infarct are noted. Vascular: No hyperdense vessel or unexpected calcification. Skull: Normal. Negative for fracture or focal lesion. Sinuses/Orbits: No acute finding. Other: None. IMPRESSION: Chronic atrophic and ischemic changes. No acute abnormality is noted. Electronically Signed   By: Oneil Devonshire M.D.   On: 06/02/2023 01:25     EKG: Independently reviewed.   Assessment/Plan  Acute hypoxemic respiratory failure in the setting of HCAP: -Hospitalized from 05/06/2023 to 05/07/2023 with fluid overload. -Currently on 5 L of oxygen  via nasal cannula.  Chest x-ray shows new right basilar infiltrate.  -Started on broad-spectrum antibiotics in the ED.  Will continue same. -Admitted in stepdown unit -She is afebrile with no leukocytosis.  Lactic acid: WNL blood culture pending. -Wean off of oxygen  as tolerated.  Monitor vitals closely  ESRD with severe hyperkalemia: Anion gap metabolic acidosis: -Received calcium  gluconate, sodium bicarb, insulin  with dextrose  and albuterol  treatment in the ED. -Underwent emergent dialysis.  Lokelma  given.   -Appreciated nephrology consult  Hypertensive emergency: -Blood pressure improved after hemodialysis.  Will continue to monitor -Resume her home medications amlodipine , clonidine , Imdur  and hydralazine   Hypercholesterolemia: On statin  Paroxysmal A-fib: -Not rate controlled. -Consulted cardiology.  Chronic systolic CHF: Ischemic cardiomyopathy - Strict INO's and daily weight. -Resume home medications  History of a stroke with right-sided hemiparesis: -Continue statin.  Ascending aortic dissection status post endovascular repair: -Stable.  DVT prophylaxis: Eliquis  Code Status: Full code Family Communication: None present at bedside.   Disposition Plan: To be determined Consults called:  Cardiology Nephrology Admission status: Inpatient   Velna JONELLE Skeeter MD [facility]  If 7PM-7AM, please contact night-coverage [email or web address]  06/02/2023, 9:43 AM

## 2023-06-02 NOTE — Consult Note (Signed)
 Cardiology Consultation:  Patient ID: Monique Henderson MRN: [id number]; DOB: [birth year]  Admit date: 06/02/2023 Date of Consult: 06/02/2023  Primary Care Provider: Shelda Atlas, MD Primary Cardiologist: Candyce Reek, MD  Primary Electrophysiologist:  None   Patient Profile:  Monique Henderson is a 73 y.o. female with a hx of ESRD on hemodialysis, stroke with right-sided deficits, cocaine  use, hypertension, paroxysmal atrial fibrillation, cocaine  abuse, systolic heart failure EF 45 to 50%, type a ascending aortic dissection status postrepair in 2020 who is being seen today for the evaluation of atrial fibrillation with RVR at the request of Velna Skeeter, MD.  History of Present Illness:  Monique Henderson presents with acute hypoxic respiratory failure and altered mental status.  She was initially found to be severely hypertensive with systolic blood pressures up to 200.  Also noted to have severe hyperkalemia.  Her hyperkalemia was corrected and she was taken for emergent hemodialysis.  There is mention of missing dialysis which is a concern.  After dialysis the patient had recurrence of atrial fibrillation.  Hospital team was concern for ST elevation but she has a chronic right bundle branch block.  Review of telemetry shows she was in atrial flutter with heart rate 151 bpm.  She has since converted back to sinus rhythm.  Her blood pressure severely elevated 220/120.  She remains volume overloaded.  She tells me she is breathing without issues and has no chest pain.  She is being treated for possible pneumonia at the right lung base.  She is maintaining sinus rhythm with her chronic right bundle branch block.  Her history is significant for chronic systolic heart failure.  Most recent echo in January of this year showed an EF of 45 to 50%.  She is also had an ascending aortic aneurysm repair which was performed endovascularly.  Stress testing does show apical infarct concerning for prior MI.   She also has a history of cocaine  use and accelerated hypertension.  Paroxysmal atrial fibrillation is noted that seems to be quite responsive to carvedilol .  She seems to be maintaining sinus rhythm and is without complaints at the time my examination.    Past Medical History: Past Medical History:  Diagnosis Date   Acute cardioembolic stroke (HCC) 02/26/2022   AF (paroxysmal atrial fibrillation) (HCC) 05/29/2019   on Coumadin    Aortic atherosclerosis (HCC) 07/05/2019   Aortic dissection (HCC) 04/04/2019   s/p repair   Bone spur 2008   Right calcaneal foot spur   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 Deward Null; S/P re-excision of superior and inferior margins 10/27/2012.    Dissection of aorta (HCC) 04/03/2019   ESRD on hemodialysis (HCC) 05/29/2019   Essential hypertension 09/16/2006   GERD 09/16/2006   Hepatitis C    treated and RNA confirmed not detectable 01/2017   Insomnia 03/14/2015   Malnutrition of moderate degree 05/19/2019   Non compliance with medical treatment 12/04/2017   Normocytic anemia    With thrombocytosis   Osteoarthritis    Right ureteral stone 2002  S/P lumbar spinal fusion 01/18/2014   S/P lumbar decompressive laminectomy, fusion, and plating for lumbar spinal stenosis on 05/27/2009 by Dr. Alm CANDIE Molt.  S/P anterolateral retroperitoneal interbody fusion L2-3 utilizing a 8 mm peek interbody cage packed with morcellized allograft, and anterior lumbar plating L2-3 for  recurrent disc herniation L2-3 with spinal stenosis on 01/18/2014 by Dr. Alm CANDIE Molt.     Stroke (cerebrum) (HCC)    Stroke (HCC) 02/23/2022   SVT (supraventricular tachycardia) (HCC) 08/28/2019   Tobacco use disorder 04/19/2009   Uterine fibroid    Wears dentures    top    Past Surgical History: Past Surgical History:  Procedure Laterality Date   ANTERIOR LAT LUMBAR FUSION N/A 01/18/2014   Procedure: ANTERIOR LATERAL LUMBAR FUSION LUMBAR TWO-THREE;  Surgeon: Alm GORMAN Molt, MD;  Location: [facility];  Service: Neurosurgery;  Laterality: N/A;   ANTERIOR LUMBAR FUSION  01/18/2014   AV FISTULA PLACEMENT Left 04/20/2019   Procedure: ARTERIOVENOUS (AV) FISTULA CREATION;  Surgeon: Sheree Penne Bruckner, MD;  Location: [facility] OR;  Service: Vascular;  Laterality: Left;   BACK SURGERY     BREAST LUMPECTOMY Left 01/2003   BREAST LUMPECTOMY Right 2014   BREAST LUMPECTOMY WITH NEEDLE LOCALIZATION AND AXILLARY SENTINEL LYMPH NODE BX Right 10/13/2012   Procedure: BREAST LUMPECTOMY WITH NEEDLE LOCALIZATION;  Surgeon: Deward GORMAN Curvin DOUGLAS, MD;  Location: [facility];  Service: General;  Laterality: Right;  Right breast wire localized lumpectomy   DIALYSIS/PERMA CATHETER INSERTION N/A 05/28/2023   Procedure: DIALYSIS/PERMA CATHETER INSERTION;  Surgeon: Melia Lynwood ORN, MD;  Location: [facility];  Service: Cardiovascular;  Laterality: N/A;   INSERTION OF DIALYSIS CATHETER Right 04/20/2019   Procedure: INSERTION OF DIALYSIS CATHETER, right internal jugular;  Surgeon: Sheree Penne Bruckner, MD;  Location: [facility] OR;  Service: Vascular;  Laterality: Right;   INSERTION OF DIALYSIS CATHETER Right 09/24/2020   Procedure: INSERTION OF TUNNELED DIALYSIS CATHETER;  Surgeon: Oris Krystal FALCON, MD;  Location: [facility] OR;  Service: Vascular;  Laterality: Right;   IR FLUORO GUIDE CV LINE RIGHT  09/22/2020   IR THORACENTESIS ASP PLEURAL SPACE W/IMG GUIDE  05/19/2019   IR US  GUIDE VASC ACCESS LEFT  09/22/2020   IR  US  GUIDE VASC ACCESS RIGHT  09/22/2020   IR VENOCAVAGRAM SVC  09/22/2020   LAMINECTOMY  05/27/2009   Lumbar decompressive laminectomy, fusion and plating for lumbar spinal stensosis   LIGATION OF ARTERIOVENOUS  FISTULA Left 09/24/2020   Procedure: LIGATION OF LEFT ARM ARTERIOVENOUS  FISTULA;  Surgeon: Oris Krystal FALCON, MD;  Location: [facility] OR;  Service: Vascular;  Laterality: Left;   LUMBAR LAMINECTOMY/DECOMPRESSION MICRODISCECTOMY Left 03/23/2013   Procedure: LUMBAR LAMINECTOMY/DECOMPRESSION MICRODISCECTOMY 1 LEVEL;  Surgeon: Alm GORMAN Molt, MD;  Location: [facility];  Service: Neurosurgery;  Laterality: Left;  LUMBAR LAMINECTOMY/DECOMPRESSION MICRODISCECTOMY 1 LEVEL   MASTECTOMY, PARTIAL Left 02/26/2003   ; S/P re-excision of cranial and lateral margins 04/19/2003.    RE-EXCISION OF BREAST CANCER,SUPERIOR MARGINS Right 10/27/2012   Procedure: RE-EXCISION OF BREAST CANCER,SUPERIOR and inferior MARGINS;  Surgeon: Deward GORMAN Curvin DOUGLAS, MD;  Location: [facility] OR;  Service: General;  Laterality: Right;   RE-EXCISION OF BREAST LUMPECTOMY Left 04/2003   TEE WITHOUT CARDIOVERSION N/A 04/04/2019   Procedure: Transesophageal Echocardiogram (Tee);  Surgeon: German Bartlett PEDLAR, MD;  Location: [facility] OR;  Service: Open Heart Surgery;  Laterality: N/A;   THORACIC AORTIC ANEURYSM REPAIR N/A 04/04/2019   Procedure: THORACIC ASCENDING ANEURYSM REPAIR (AAA)  USING  28 MM X 30 CM HEMASHIELD PLATINUM VASCULAR GRAFT;  Surgeon: German Bartlett PEDLAR, MD;  Location: [facility] OR;  Service: Open Heart Surgery;  Laterality: N/A;     Allergies:    Allergies  Allergen Reactions   Shrimp [Shellfish Allergy] Shortness Of Breath   Bactroban  [Mupirocin ] Other (See Comments)    Sores in nose   Hydrocodone  Itching, Nausea Only and Nausea And Vomiting   Chlorhexidine  Gluconate Itching   Tylenol  [Acetaminophen ] Itching    Takes Percocet at home 05/06/23   Zestril  [Lisinopril ] Cough    Social History:   Social History   Socioeconomic History   Marital  status: Single    Spouse name: Not on file   Number of children: 2   Years of education: Not on file   Highest education level: Not on file  Occupational History   Occupation: disabled  Tobacco Use   Smoking status: Former    Current packs/day: 0.00    Average packs/day: 0.3 packs/day for 44.0 years (11.0 ttl pk-yrs)    Types: Cigarettes    Start date: 57    Quit date: 2022    Years since quitting: 3.0   Smokeless tobacco: Never   Tobacco comments:    Former smoker 04/21/23  Vaping Use   Vaping status: Never Used  Substance and Sexual Activity   Alcohol  use: Not Currently    Alcohol /week: 2.0 standard drinks of alcohol     Types: 2 Cans of beer per week    Comment: couple beers a weekend, not currently   Drug use: Not Currently    Types: Cocaine     Comment: last in 2005   Sexual activity: Yes    Birth control/protection: Post-menopausal  Other Topics Concern   Not on file  Social History Narrative   Not on file   Social Drivers of Health   Financial Resource Strain: Low Risk  (04/14/2021)   Received from [facility], [facility]   Overall Financial Resource Strain (CARDIA)    Difficulty of Paying Living Expenses: Not hard at all  Food Insecurity: No Food Insecurity (05/06/2023)   Hunger Vital Sign    Worried About Running Out of Food in the Last Year: Never true    Ran Out of Food in the Last Year: Never true  Transportation Needs: No Transportation Needs (05/06/2023)   PRAPARE - Administrator, Civil Service (Medical): No    Lack of Transportation (Non-Medical): No  Physical Activity: Inactive (04/14/2021)   Received from [facility], [facility]   Exercise Vital Sign    Days of Exercise per Week: 0 days    Minutes of Exercise per Session: 0 min  Stress: Stress Concern Present (04/14/2021)   Received from [facility], [facility] Occup[facility] - Occupational Stress Questionnaire    Feeling of Stress : Very  much  Social Connections: Unknown (09/28/2021)   Received from [facility], [facility]   Social Network    Social Network: Not on file  Intimate Partner Violence: Not At Risk (05/06/2023)   Humiliation, Afraid, Rape, and Kick questionnaire    Fear of Current or Ex-Partner: No    Emotionally Abused: No    Physically Abused: No    Sexually Abused: No     Family History:    Family History  Problem Relation Age of Onset   Colon cancer Mother 51   Hypertension Mother    Diabetes Sister 81   Hypertension Sister  Diabetes Brother    Hypertension Brother    Diabetes Brother    Hypertension Brother    Kidney disease Son        s/p renal transplant   Hypertension Son    Diabetes Son    Multiple sclerosis Son    Bone cancer Sister 82   Breast cancer Neg Hx    Cervical cancer Neg Hx      ROS:  All other ROS reviewed and negative. Pertinent positives noted in the HPI.     Physical Exam/Data:   Vitals:   06/02/23 0600 06/02/23 0615 06/02/23 0645 06/02/23 0700  BP: (!) 182/118 (!) 238/222 (!) 151/111 (!) 145/97  Pulse:      Resp: 16 14 16 17   Temp:    97.8 F (36.6 C)  TempSrc:    Oral  SpO2:        Intake/Output Summary (Last 24 hours) at 06/02/2023 1029 Last data filed at 06/02/2023 0700 Gross per 24 hour  Intake --  Output 2700 ml  Net -2700 ml       05/29/2023    7:22 AM 05/28/2023    7:45 PM 05/28/2023    8:40 AM  Last 3 Weights  Weight (lbs) 106 lb 106 lb 106 lb  Weight (kg) 48.081 kg 48.081 kg 48.081 kg    There is no height or weight on file to calculate BMI.  General: Well nourished, well developed, in no acute distress Head: Atraumatic, normal size  Eyes: PEERLA, EOMI  Neck: Supple, JVD 12 to 15 cm of water Endocrine: No thryomegaly Cardiac: Normal S1, S2; RRR; no murmurs, rubs, or gallops Lungs: Diminished breath sounds bilaterally Abd: Soft, nontender, no hepatomegaly  Ext: No edema, pulses 2+ Musculoskeletal: No deformities, BUE and BLE  strength normal and equal Skin: Warm and dry, no rashes   Neuro: Alert and oriented to person, place, time, and situation, CNII-XII grossly intact, no focal deficits  Psych: Normal mood and affect   EKG:  The EKG was personally reviewed and demonstrates: Sinus rhythm heart rate 81, right bundle branch block which is chronic Telemetry:  Telemetry was personally reviewed and demonstrates: Atrial flutter heart rate 151 bpm, now sinus rhythm 90s  Relevant CV Studies: TTE 06/19/2022  1. Distal septal hypokinesis consider bringing patient back for  TDI/strain imaging Consider further w/u for amyloid. Left ventricular  ejection fraction, by estimation, is 45 to 50%. The left ventricle has  mildly decreased function. The left ventricle  has no regional wall motion abnormalities. There is severe left  ventricular hypertrophy. Left ventricular diastolic parameters are  consistent with Grade II diastolic dysfunction (pseudonormalization).  Elevated left ventricular end-diastolic pressure.   2. Right ventricular systolic function is normal. The right ventricular  size is normal.   3. Left atrial size was mildly dilated.   4. The mitral valve is abnormal. Mild mitral valve regurgitation. No  evidence of mitral stenosis.   5. Tricuspid valve regurgitation is moderate.   6. The aortic valve is tricuspid. There is moderate thickening of the  aortic valve. Aortic valve regurgitation is not visualized. No aortic  stenosis is present.   7. The inferior vena cava is normal in size with greater than 50%  respiratory variability, suggesting right atrial pressure of 3 mmHg.   Assessment and Plan:   # Paroxysmal atrial flutter -Reviewed telemetry.  She had recurrence of atrial flutter after dialysis.  Now in sinus rhythm. -Would resume home carvedilol  25 mg twice daily.  She seems to have maintained sinus rhythm with this in the past.  If she has recurrence would recommend amiodarone .  She has limited  options for rhythm control medication in the setting of systolic dysfunction and end-stage kidney disease. -Resume home Eliquis .  Based on serum creatinine and body weight we will change the dose to 2.5 mg twice daily.  # RBBB -I have reviewed her ECG.  She has no ST elevation.  She has a right bundle branch block with T wave inversions noted in V1 through V3.  I am not concerned about ST elevation myocardial infarction.  # Chronic systolic heart failure, EF 45 to 50% # Ischemic cardiomyopathy, likely LAD infarct -Review of ultrasound shows an EF of 45 to 50%.  She has distal septal hypokinesis.  Stress test in 2023 showed apical infarct.  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  There is also mention of cocaine  use but she reports none recently. -She still appears volume overloaded.  Would continue carvedilol  25 mg twice daily.  I have reordered her hydralazine  100 mg 3 times daily and Imdur  30 mg daily.  She has not a candidate for ACE/ARB/ARNI/MRA given ESRD.  Would avoid SGLT2 inhibitor. -She would benefit from more dialysis as she appears volume overloaded. -We will update her echocardiogram while she is here.  # ESRD # Hyperkalemia # Acute hypoxic respiratory failure secondary to pneumonia and likely pulmonary edema -Being treated by hospital medicine team.  She is on antibiotics for possible pneumonia.  She underwent urgent dialysis for hyperkalemia.  Compliance with dialysis seems to be an issue.  # CAD, abnormal nuclear medicine stress test -Reviewed stress test from January 2023.  This shows apical scarring concerning for possible prior LAD infarct.  Echo with also distal septal hypokinesis. -Does not appear to be having acute chest pain.  Troponins will likely be elevated in the setting of rapid A-fib.  Would manage this conservatively.  She is noncompliant with medications and on hemodialysis.  She has  also quite debilitated from prior stroke. -Hold aspirin  for now.  Restart Eliquis  as above.  I have ordered her home statin. -Restart home beta-blocker.  # Ascending aortic dissection status post endovascular repair -Stable on recent chest CT.  Will need follow-up as an outpatient.  # Hypertension -I have restarted several of her home BP medications as above.  Repeat echo for evaluation of LVEF.  # CVA with R sided weakness -no acute issues  # Medication non-compliance # Cocaine  use -encouraged to take medications. Refrain from cocaine .       For questions or updates, please contact [facility] Please consult [email or web address] for contact info under      Signed, Darryle T. Barbaraann, MD, [facility]  [facility]  06/02/2023 10:29 AM

## 2023-06-02 NOTE — Consult Note (Signed)
 Monique Henderson Admit Date: 06/02/2023 06/02/2023 Monique Henderson Requesting Physician:  Haze MD  Reason for Consult:  ESRD, Hyperkalemia, AHRF  HPI:  29F ESRD TTS at [facility] presented to ED earlier today with resp distress and AMS.  EMS found patient with SpO2 of 80% requiring nonrebreather now on 5 L.  Blood pressure is elevated upon arrival with systolic 160s to 799d.  Here potassium of greater than 7.5 with bicarbonate of 18, BUN 124, creatinine 16.8.  Hemoglobin 14.1.  First EKG with prolonged QRS interval, second EKG with prolonged PR interval, QRS of 190, no peaked T waves.  Both are independently reviewed.  ED treated with calcium  gluconate, albuterol , insulin /dextrose , sodium bicarbonate .  ED portable chest x-ray with a right basilar infiltrate.,  Independently reviewed.  Patient started on vancomycin , Flagyl , cefepime .  Patient seen in the ED.  She states her last dialysis was on Saturday 12/28 but this is incorrect, it was 12/26.  She says she came for pain all over.  She denies any current pain.  She is a poor historian.  She had exchange of her right IJ TDC on 12/27.  Other PMH includes hypertension, history of breast cancer, COPD, A-fib on apixaban .  Outpatient HD prescription: [facility], 3.5 hours, 2K, 2 calcium , no heparin , TDC, EDW 46 kg, BFR 400, DFR A1.5.  Hectorol  9 mcg every treatment.  Sensipar  60 mg every treatment.  Last Mircera 12/10.   ROS Balance of 12 systems is negative w/ exceptions as above  PMH  Past Medical History:  Diagnosis Date   Acute cardioembolic stroke (HCC) 02/26/2022   AF (paroxysmal atrial fibrillation) (HCC) 05/29/2019   on Coumadin    Aortic atherosclerosis (HCC) 07/05/2019   Aortic dissection (HCC) 04/04/2019   s/p repair   Bone spur 2008   Right calcaneal foot spur   Breast cancer (HCC) 2004   Ductal carcinoma in situ of the left breast; S/P left partial mastectomy 02/26/2003; S/P re-excision of cranial and lateral  margins11/18/2004.radiation   Carotid artery disease (HCC)    Cerebral thrombosis with cerebral infarction 05/22/2019   Chronic HFrEF (heart failure with reduced ejection fraction) (HCC)    Chronic low back pain 06/22/2016   Chronic obstructive lung disease (HCC) 01/16/2017   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 Deward Null; S/P re-excision of superior and inferior margins 10/27/2012.    Dissection of aorta (HCC) 04/03/2019   ESRD on hemodialysis (HCC) 05/29/2019   Essential hypertension 09/16/2006   GERD 09/16/2006   Hepatitis C    treated and RNA confirmed not detectable 01/2017   Insomnia 03/14/2015   Malnutrition of moderate degree 05/19/2019   Non compliance with medical treatment 12/04/2017   Normocytic anemia    With thrombocytosis   Osteoarthritis    Right ureteral stone 2002   S/P lumbar spinal fusion 01/18/2014   S/P lumbar decompressive laminectomy, fusion, and plating for lumbar spinal stenosis on 05/27/2009 by Dr. Alm CANDIE Molt.  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 Alm CANDIE Molt.     Stroke (cerebrum) [facility])    Stroke (HCC) 02/23/2022   SVT (supraventricular tachycardia) (HCC) 08/28/2019   Tobacco use disorder 04/19/2009   Uterine fibroid    Wears dentures    top   PSH  Past Surgical History:  Procedure Laterality Date   ANTERIOR LAT LUMBAR FUSION N/A 01/18/2014   Procedure: ANTERIOR LATERAL LUMBAR FUSION LUMBAR TWO-THREE;  Surgeon: Alm GORMAN Molt, MD;  Location: [facility];  Service: Neurosurgery;  Laterality: N/A;   ANTERIOR LUMBAR FUSION  01/18/2014   AV FISTULA PLACEMENT Left 04/20/2019   Procedure: ARTERIOVENOUS (AV) FISTULA CREATION;  Surgeon: Sheree Penne Bruckner, MD;  Location: [facility] OR;  Service:  Vascular;  Laterality: Left;   BACK SURGERY     BREAST LUMPECTOMY Left 01/2003   BREAST LUMPECTOMY Right 2014   BREAST LUMPECTOMY WITH NEEDLE LOCALIZATION AND AXILLARY SENTINEL LYMPH NODE BX Right 10/13/2012   Procedure: BREAST LUMPECTOMY WITH NEEDLE LOCALIZATION;  Surgeon: Deward GORMAN Curvin DOUGLAS, MD;  Location: [facility];  Service: General;  Laterality: Right;  Right breast wire localized lumpectomy   DIALYSIS/PERMA CATHETER INSERTION N/A 05/28/2023   Procedure: DIALYSIS/PERMA CATHETER INSERTION;  Surgeon: Melia Lynwood ORN, MD;  Location: [facility];  Service: Cardiovascular;  Laterality: N/A;   INSERTION OF DIALYSIS CATHETER Right 04/20/2019   Procedure: INSERTION OF DIALYSIS CATHETER, right internal jugular;  Surgeon: Sheree Penne Bruckner, MD;  Location: [facility] OR;  Service: Vascular;  Laterality: Right;   INSERTION OF DIALYSIS CATHETER Right 09/24/2020   Procedure: INSERTION OF TUNNELED DIALYSIS CATHETER;  Surgeon: Oris Krystal FALCON, MD;  Location: [facility] OR;  Service: Vascular;  Laterality: Right;   IR FLUORO GUIDE CV LINE RIGHT  09/22/2020   IR THORACENTESIS ASP PLEURAL SPACE W/IMG GUIDE  05/19/2019   IR US  GUIDE VASC ACCESS LEFT  09/22/2020   IR US  GUIDE VASC ACCESS RIGHT  09/22/2020   IR VENOCAVAGRAM SVC  09/22/2020   LAMINECTOMY  05/27/2009   Lumbar decompressive laminectomy, fusion and plating for lumbar spinal stensosis   LIGATION OF ARTERIOVENOUS  FISTULA Left 09/24/2020   Procedure: LIGATION OF LEFT ARM ARTERIOVENOUS  FISTULA;  Surgeon: Oris Krystal FALCON, MD;  Location: [facility] OR;  Service: Vascular;  Laterality: Left;   LUMBAR LAMINECTOMY/DECOMPRESSION MICRODISCECTOMY Left 03/23/2013   Procedure: LUMBAR LAMINECTOMY/DECOMPRESSION MICRODISCECTOMY 1 LEVEL;  Surgeon: Alm GORMAN Molt, MD;  Location: [facility];  Service: Neurosurgery;  Laterality: Left;  LUMBAR LAMINECTOMY/DECOMPRESSION MICRODISCECTOMY 1 LEVEL   MASTECTOMY, PARTIAL Left 02/26/2003   ; S/P re-excision of cranial and lateral  margins 04/19/2003.    RE-EXCISION OF BREAST CANCER,SUPERIOR MARGINS Right 10/27/2012   Procedure: RE-EXCISION OF BREAST CANCER,SUPERIOR and inferior MARGINS;  Surgeon: Deward GORMAN Curvin DOUGLAS, MD;  Location: [facility] OR;  Service: General;  Laterality: Right;   RE-EXCISION OF BREAST LUMPECTOMY Left 04/2003   TEE WITHOUT CARDIOVERSION N/A 04/04/2019   Procedure: Transesophageal Echocardiogram (Tee);  Surgeon: German Bartlett PEDLAR, MD;  Location: [facility] OR;  Service: Open Heart Surgery;  Laterality: N/A;   THORACIC AORTIC ANEURYSM REPAIR N/A 04/04/2019   Procedure: THORACIC ASCENDING ANEURYSM REPAIR (AAA)  USING 28 MM X 30 CM HEMASHIELD PLATINUM VASCULAR GRAFT;  Surgeon: German Bartlett PEDLAR, MD;  Location: [facility] OR;  Service: Open Heart Surgery;  Laterality: N/A;   FH  Family History  Problem Relation Age of Onset   Colon cancer Mother 64   Hypertension Mother    Diabetes Sister 74   Hypertension Sister    Diabetes Brother    Hypertension Brother    Diabetes Brother    Hypertension Brother    Kidney disease Son        s/p renal transplant   Hypertension Son    Diabetes Son    Multiple sclerosis Son  Bone cancer Sister 3   Breast cancer Neg Hx    Cervical cancer Neg Hx    SH  reports that she quit smoking about 3 years ago. Her smoking use included cigarettes. She started smoking about 47 years ago. She has a 11 pack-year smoking history. She has never used smokeless tobacco. She reports that she does not currently use alcohol  after a past usage of about 2.0 standard drinks of alcohol  per week. She reports that she does not currently use drugs after having used the following drugs: Cocaine . Allergies  Allergies  Allergen Reactions   Shrimp [Shellfish Allergy] Shortness Of Breath   Bactroban  [Mupirocin ] Other (See Comments)    Sores in nose   Hydrocodone  Itching, Nausea Only and Nausea And Vomiting   Chlorhexidine  Gluconate Itching   Tylenol  [Acetaminophen ] Itching    Takes Percocet at home 05/06/23    Zestril  [Lisinopril ] Cough   Home medications Prior to Admission medications   Medication Sig Start Date End Date Taking? Authorizing Provider  albuterol  (PROVENTIL ) (2.5 MG/3ML) 0.083% nebulizer solution Take 2.5 mg by nebulization every 6 (six) hours as needed for wheezing or shortness of breath.    [provider]  albuterol  (VENTOLIN  HFA) 108 (90 Base) MCG/ACT inhaler Inhale 2 puffs into the lungs every 6 (six) hours as needed for wheezing or shortness of breath. 07/30/20   Kip Ade, NP  amLODipine  (NORVASC ) 10 MG tablet Take 1 tablet (10 mg total) by mouth daily. 03/06/22   Setzer, Sandra J, PA-C  apixaban  (ELIQUIS ) 2.5 MG TABS tablet Take 1 tablet (2.5 mg total) by mouth 2 (two) times daily. 03/06/22   Setzer, Sandra J, PA-C  carvedilol  (COREG ) 25 MG tablet Take 1 tablet (25 mg total) by mouth 2 (two) times daily with a meal. 05/01/22 05/07/23  Ghimire, Donalda HERO, MD  Cholecalciferol  (VITAMIN D -3 PO) Take 1 capsule by mouth daily.    [provider]  cloNIDine  (CATAPRES ) 0.1 MG tablet Take 1 tablet (0.1 mg total) by mouth 2 (two) times daily. 12/18/22   Dann Candyce RAMAN, MD  hydrALAZINE  (APRESOLINE ) 100 MG tablet Take 1 tablet (100 mg total) by mouth every 8 (eight) hours. 12/18/22   Dann Candyce RAMAN, MD  isosorbide  mononitrate (IMDUR ) 30 MG 24 hr tablet TAKE 1 TABLET (30MG ) ON DIALYSIS DAYS AND 2 TABLETS (60 MG) ON NON DIALYSIS DAYS. 01/12/23   Dann Candyce RAMAN, MD  naloxone  (NARCAN ) nasal spray 4 mg/0.1 mL 1 spray as directed. 02/19/22   [provider]  oxyCODONE -acetaminophen  (PERCOCET) 10-325 MG tablet Take 1 tablet by mouth in the morning and at bedtime. 03/12/22   [provider]  pantoprazole  (PROTONIX ) 40 MG tablet TAKE 1 TABLET (40 MG TOTAL) BY MOUTH 2 (TWO) TIMES DAILY. 05/13/23   Tanda Bleacher, MD  rosuvastatin  (CRESTOR ) 10 MG tablet TAKE 1 TABLET (10 MG TOTAL) BY MOUTH DAILY FOR CHOLESTEROL 08/07/22   Tanda Bleacher, MD  senna-docusate  (SENOKOT-S) 8.6-50 MG tablet Take 1 tablet by mouth 2 (two) times daily between meals as needed for mild constipation. Patient taking differently: Take 1 tablet by mouth 2 (two) times daily as needed for mild constipation. 08/14/22   Gonfa, Taye T, MD  sevelamer  carbonate (RENVELA ) 2.4 g PACK Take 2.4 g by mouth 3 (three) times daily with meals. 03/06/22   Setzer, Nena PARAS, PA-C  traZODone  (DESYREL ) 50 MG tablet Take 25-50 mg by mouth at bedtime. 03/19/23   [provider]    Current Medications Scheduled Meds:  sodium zirconium cyclosilicate   10 g Oral Daily   Continuous Infusions:  lactated ringers  150 mL/hr at 06/02/23 0113   metronidazole  500 mg (06/02/23 0159)   vancomycin      PRN Meds:.  CBC Recent Labs  Lab 05/28/23 2056 06/02/23 0034  WBC 3.7* 6.4  NEUTROABS 2.3 5.2  HGB 12.3 14.1  HCT 39.5 45.5  MCV 97.1 96.0  PLT 188 245   Basic Metabolic Panel Recent Labs  Lab 05/28/23 2056 06/02/23 0034  NA 141 136  K 5.2* >7.5*  CL 100 96*  CO2 23 18*  GLUCOSE 91 99  BUN 48* 124*  CREATININE 8.86* 16.81*  CALCIUM  8.5* 9.6    Physical Exam  Blood pressure (!) 162/101, pulse 73, temperature 98.8 F (37.1 C), temperature source Rectal, resp. rate 18, SpO2 92%. GEN: Chronically ill-appearing on nasal cannula ENT: NCAT EYES: EOMI CV: Regular, normal S1 and S2, no rub PULM: Diminished breath sounds at the bases, coarse throughout ABD: Soft, nontender SKIN: Right IJ TDC site C/D/I EXT: No lower extremity edema  Assessment 89F ESRD with severe hyperkalemia, AHRF with hypertensive emergency and likely HCAP  ESRD THS, last HD 12/26, azotemic, hyperkalemic Hyperkalemia, severe from missed dialysis AHRF with hypertensive emergency on nasal cannula Likely HCAP on broad-spectrum antibiotics Recent exchange of [facility] 05/28/2023 Anemia, no current issues EKG-BMD on Sensipar  and Hectorol , no inpatient issues AFib on apixaban   Plan HD now in [facility], 2K, up to 4L UF  using TDC; No Heparin , 3.5h discussed with [facility] RN Will follow along  Monique Henderson  06/02/2023, 2:26 AM

## 2023-06-02 NOTE — ED Notes (Signed)
 Patient transported to dialysis with transport. She went on a monitor. The patient was stable and able to safely transfer without RN.

## 2023-06-02 NOTE — ED Triage Notes (Signed)
 Patient coming from home, niece called EMS for altered mental status and stroke unknown last known well. Patient has hx of stroke but family did not know the deficits. She was found 80% in room air. They put her on a nonrebreather and she went up to 100%. Patient has missed dialysis. She is also hot to the touch.

## 2023-06-02 NOTE — ED Notes (Signed)
 ED TO INPATIENT HANDOFF REPORT  ED Nurse Name and Phone #:  Erminio [phone]  S Name/Age/Gender Elveria JULIANNA Shams 73 y.o. female Room/Bed: 5W28C/5W28C-01  Code Status   Code Status: Full Code  Home/SNF/Other Home Patient oriented to: self, place, time, and situation Is this baseline? Yes   Triage Complete: Triage complete  Chief Complaint Hyperkalemia [E87.5] Acute hypoxic respiratory failure (HCC) [J96.01]  Triage Note Patient coming from home, niece called EMS for altered mental status and stroke unknown last known well. Patient has hx of stroke but family did not know the deficits. She was found 80% in room air. They put her on a nonrebreather and she went up to 100%. Patient has missed dialysis. She is also hot to the touch.   Allergies Allergies  Allergen Reactions   Shrimp [Shellfish Allergy] Shortness Of Breath   Bactroban  [Mupirocin ] Other (See Comments)    Sores in nose   Hydrocodone  Itching, Nausea Only and Nausea And Vomiting   Chlorhexidine  Gluconate Itching   Tylenol  [Acetaminophen ] Itching    Takes Percocet at home 05/06/23   Zestril  [Lisinopril ] Cough    Level of Care/Admitting Diagnosis ED Disposition     ED Disposition  Admit   Condition  --   Comment  Hospital [facility] [[id number]]  Level of Care: Progressive [102]  Admit to Progressive based on following criteria: NEPHROLOGY stable condition requiring close monitoring for AKI, requiring Hemodialysis or Peritoneal Dialysis either from expected electrolyte imbalance, acidosis, or fluid overload that can be managed by NIPPV or high flow oxygen .  Admit to Progressive based on following criteria: CARDIOVASCULAR & THORACIC of moderate stability with acute coronary syndrome symptoms/low risk myocardial infarction/hypertensive urgency/arrhythmias/heart failure potentially compromising stability and stable post cardiovascular intervention patients.  Admit to Progressive based on  following criteria: RESPIRATORY PROBLEMS hypoxemic/hypercapnic respiratory failure that is responsive to NIPPV (BiPAP) or High Flow Nasal Cannula (6-80 lpm). Frequent assessment/intervention, no > Q2 hrs < Q4 hrs, to maintain oxygenation and pulmonary hygiene.  May admit patient to [facility] if equivalent level of care is available:: Yes  Covid Evaluation: Asymptomatic - no recent exposure (last 10 days) testing not required  Diagnosis: Hyperkalemia [827805]  Admitting Physician: VERNON VELNA SAUNDERS [[id number]]  Attending Physician: PAHWANI, RINKA R [phone]  Certification:: I certify this patient will need inpatient services for at least 2 midnights          B Medical/Surgery History Past Medical History:  Diagnosis Date   Acute cardioembolic stroke (HCC) 02/26/2022   AF (paroxysmal atrial fibrillation) (HCC) 05/29/2019   on Coumadin    Aortic atherosclerosis (HCC) 07/05/2019   Aortic dissection (HCC) 04/04/2019   s/p repair   Bone spur 2008   Right calcaneal foot spur   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 Deward Null; S/P re-excision of superior and inferior margins 10/27/2012.    Dissection of aorta (HCC) 04/03/2019   ESRD on hemodialysis (HCC) 05/29/2019   Essential hypertension 09/16/2006   GERD 09/16/2006   Hepatitis C  treated and RNA confirmed not detectable 01/2017   Insomnia 03/14/2015   Malnutrition of moderate degree 05/19/2019   Non compliance with medical treatment  12/04/2017   Normocytic anemia    With thrombocytosis   Osteoarthritis    Right ureteral stone 2002   S/P lumbar spinal fusion 01/18/2014   S/P lumbar decompressive laminectomy, fusion, and plating for lumbar spinal stenosis on 05/27/2009 by Dr. Alm CANDIE Molt.  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 Alm CANDIE Molt.     Stroke (cerebrum) (HCC)    Stroke (HCC) 02/23/2022   SVT (supraventricular tachycardia) (HCC) 08/28/2019   Tobacco use disorder 04/19/2009   Uterine fibroid    Wears dentures    top   Past Surgical History:  Procedure Laterality Date   ANTERIOR LAT LUMBAR FUSION N/A 01/18/2014   Procedure: ANTERIOR LATERAL LUMBAR FUSION LUMBAR TWO-THREE;  Surgeon: Alm GORMAN Molt, MD;  Location: [facility];  Service: Neurosurgery;  Laterality: N/A;   ANTERIOR LUMBAR FUSION  01/18/2014   AV FISTULA PLACEMENT Left 04/20/2019   Procedure: ARTERIOVENOUS (AV) FISTULA CREATION;  Surgeon: Sheree Penne Bruckner, MD;  Location: [facility] OR;  Service: Vascular;  Laterality: Left;   BACK SURGERY     BREAST LUMPECTOMY Left 01/2003   BREAST LUMPECTOMY Right 2014   BREAST LUMPECTOMY WITH NEEDLE LOCALIZATION AND AXILLARY SENTINEL LYMPH NODE BX Right 10/13/2012   Procedure: BREAST LUMPECTOMY WITH NEEDLE LOCALIZATION;  Surgeon: Deward GORMAN Curvin DOUGLAS, MD;  Location: [facility];  Service: General;  Laterality: Right;  Right breast wire localized lumpectomy   DIALYSIS/PERMA CATHETER INSERTION N/A 05/28/2023   Procedure: DIALYSIS/PERMA CATHETER INSERTION;  Surgeon: Melia Lynwood ORN, MD;  Location: [facility];  Service: Cardiovascular;  Laterality: N/A;   INSERTION OF DIALYSIS CATHETER Right 04/20/2019   Procedure: INSERTION OF DIALYSIS CATHETER, right internal jugular;  Surgeon: Sheree Penne Bruckner,  MD;  Location: [facility] OR;  Service: Vascular;  Laterality: Right;   INSERTION OF DIALYSIS CATHETER Right 09/24/2020   Procedure: INSERTION OF TUNNELED DIALYSIS CATHETER;  Surgeon: Oris Krystal FALCON, MD;  Location: [facility];  Service: Vascular;  Laterality: Right;   IR FLUORO GUIDE CV LINE RIGHT  09/22/2020   IR THORACENTESIS ASP PLEURAL SPACE W/IMG GUIDE  05/19/2019   IR US  GUIDE VASC ACCESS LEFT  09/22/2020   IR US  GUIDE VASC ACCESS RIGHT  09/22/2020   IR VENOCAVAGRAM SVC  09/22/2020   LAMINECTOMY  05/27/2009   Lumbar decompressive laminectomy, fusion and plating for lumbar spinal stensosis   LIGATION OF ARTERIOVENOUS  FISTULA Left 09/24/2020   Procedure: LIGATION OF LEFT ARM ARTERIOVENOUS  FISTULA;  Surgeon: Oris Krystal FALCON, MD;  Location: [facility] OR;  Service: Vascular;  Laterality: Left;   LUMBAR LAMINECTOMY/DECOMPRESSION MICRODISCECTOMY Left 03/23/2013   Procedure: LUMBAR LAMINECTOMY/DECOMPRESSION MICRODISCECTOMY 1 LEVEL;  Surgeon: Alm GORMAN Molt, MD;  [facility];  Service: Neurosurgery;  Laterality: Left;  LUMBAR LAMINECTOMY/DECOMPRESSION MICRODISCECTOMY 1 LEVEL   MASTECTOMY, PARTIAL Left 02/26/2003   ; S/P re-excision of cranial and lateral margins 04/19/2003.    RE-EXCISION OF BREAST CANCER,SUPERIOR MARGINS Right 10/27/2012   Procedure: RE-EXCISION OF BREAST CANCER,SUPERIOR and inferior MARGINS;  Surgeon: Deward GORMAN Curvin DOUGLAS, MD;  Location[facility];  Service: General;  Laterality: Right;   RE-EXCISION OF BREAST LUMPECTOMY Left 04/2003   TEE WITHOUT CARDIOVERSION N/A  04/04/2019   Procedure: Transesophageal Echocardiogram (Tee);  Surgeon: German Bartlett PEDLAR, MD;  Location[facility] OR;  Service: Open Heart Surgery;  Laterality: N/A;   THORACIC AORTIC ANEURYSM REPAIR N/A 04/04/2019   Procedure: THORACIC ASCENDING ANEURYSM REPAIR (AAA)  USING 28 MM X 30 CM HEMASHIELD PLATINUM VASCULAR GRAFT;  Surgeon: German Bartlett PEDLAR, MD;  Location: [facility];  Service: Open Heart Surgery;  Laterality: N/A;     A IV  Location/Drains/Wounds Patient Lines/Drains/Airways Status     Active Line/Drains/Airways     Name Placement date Placement time Site Days   Peripheral IV 06/02/23 20 G Anterior;Left Forearm 06/02/23  0023  Forearm  less than 1   Hemodialysis Catheter Right Subclavian Double lumen Permanent (Tunneled) 05/28/23  0942  Subclavian  5            Intake/Output Last 24 hours  Intake/Output Summary (Last 24 hours) at 06/02/2023 1710 Last data filed at 06/02/2023 1254 Gross per 24 hour  Intake 1134.12 ml  Output 2700 ml  Net -1565.88 ml    Labs/Imaging Results for orders placed or performed during the hospital encounter of 06/02/23 (from the past 48 hours)  Resp panel by RT-PCR (RSV, Flu A&B, Covid) Anterior Nasal Swab     Status: None   Collection Time: 06/02/23 12:34 AM   Specimen: Anterior Nasal Swab  Result Value Ref Range   SARS Coronavirus 2 by RT PCR NEGATIVE NEGATIVE   Influenza A by PCR NEGATIVE NEGATIVE   Influenza B by PCR NEGATIVE NEGATIVE    Comment: (NOTE) The Xpert Xpress SARS-CoV-2/FLU/RSV plus assay is intended as an aid in the diagnosis of influenza from Nasopharyngeal swab specimens and should not be used as a sole basis for treatment. Nasal washings and aspirates are unacceptable for Xpert Xpress SARS-CoV-2/FLU/RSV testing.  Fact Sheet for Patients: bloggercourse.com  Fact Sheet for Healthcare Providers: seriousbroker.it  This test is not yet approved or cleared by the [place]  FDA and has been authorized for detection and/or diagnosis of SARS-CoV-2 by FDA under an Emergency Use Authorization (EUA). This EUA will remain in effect (meaning this test can be used) for the duration of the COVID-19 declaration under Section 564(b)(1) of the Act, 21 U.S.C. section 360bbb-3(b)(1), unless the authorization is terminated or revoked.     Resp Syncytial Virus by PCR NEGATIVE NEGATIVE    Comment: (NOTE) Fact  Sheet for Patients: bloggercourse.com  Fact Sheet for Healthcare Providers: seriousbroker.it  This test is not yet approved or cleared by the [place]  FDA and has been authorized for detection and/or diagnosis of SARS-CoV-2 by FDA under an Emergency Use Authorization (EUA). This EUA will remain in effect (meaning this test can be used) for the duration of the COVID-19 declaration under Section 564(b)(1) of the Act, 21 U.S.C. section 360bbb-3(b)(1), unless the authorization is terminated or revoked.  Performed at [facility], [address], [place], [place] [address]   Comprehensive metabolic panel     Status: Abnormal   Collection Time: 06/02/23 12:34 AM  Result Value Ref Range   Sodium 136 135 - 145 mmol/L   Potassium >7.5 (HH) 3.5 - 5.1 mmol/L    Comment: CRITICAL RESULT CALLED TO, READ BACK BY AND VERIFIED WITH: JEREL SAUNDERS, RN 0131 06/02/2023 SANDOVAL K     Chloride 96 (L) 98 - 111 mmol/L   CO2 18 (L) 22 - 32 mmol/L   Glucose, Bld 99 70 - 99 mg/dL    Comment: Glucose reference range applies only  to samples taken after fasting for at least 8 hours.   BUN 124 (H) 8 - 23 mg/dL   Creatinine, Ser 83.18 (H) 0.44 - 1.00 mg/dL   Calcium  9.6 8.9 - 10.3 mg/dL   Total Protein 8.0 6.5 - 8.1 g/dL   Albumin  4.3 3.5 - 5.0 g/dL   AST 18 15 - 41 U/L   ALT 10 0 - 44 U/L   Alkaline Phosphatase 72 38 - 126 U/L   Total Bilirubin 0.9 0.0 - 1.2 mg/dL   GFR, Estimated 2 (L) >60 mL/min    Comment: (NOTE) Calculated using the CKD-EPI Creatinine Equation (2021)    Anion gap 22 (H) 5 - 15    Comment: Performed at [facility], [address], [place], [place] [address] CORRECTED ON 01/01 AT 0131: PREVIOUSLY REPORTED AS 22 ELECTROLYTES REPEATED TO VERIFY   CBC with Differential     Status: Abnormal   Collection Time: 06/02/23 12:34 AM  Result Value Ref Range   WBC 6.4 4.0 - 10.5 K/uL   RBC 4.74 3.87 - 5.11 MIL/uL   Hemoglobin  14.1 12.0 - 15.0 g/dL   HCT 54.4 63.9 - 53.9 %   MCV 96.0 80.0 - 100.0 fL   MCH 29.7 26.0 - 34.0 pg   MCHC 31.0 30.0 - 36.0 g/dL   RDW 81.5 (H) 88.4 - 84.4 %   Platelets 245 150 - 400 K/uL   nRBC 0.0 0.0 - 0.2 %   Neutrophils Relative % 81 %   Neutro Abs 5.2 1.7 - 7.7 K/uL   Lymphocytes Relative 7 %   Lymphs Abs 0.5 (L) 0.7 - 4.0 K/uL   Monocytes Relative 10 %   Monocytes Absolute 0.7 0.1 - 1.0 K/uL   Eosinophils Relative 1 %   Eosinophils Absolute 0.1 0.0 - 0.5 K/uL   Basophils Relative 1 %   Basophils Absolute 0.0 0.0 - 0.1 K/uL   Immature Granulocytes 0 %   Abs Immature Granulocytes 0.02 0.00 - 0.07 K/uL    Comment: Performed at [facility], [address], [place], [place] [address]  Protime-INR     Status: None   Collection Time: 06/02/23 12:34 AM  Result Value Ref Range   Prothrombin Time 14.7 11.4 - 15.2 seconds   INR 1.1 0.8 - 1.2    Comment: (NOTE) INR goal varies based on device and disease states. Performed at [facility], [address] [place], [place] [address]   APTT     Status: Abnormal   Collection Time: 06/02/23 12:34 AM  Result Value Ref Range   aPTT 40 (H) 24 - 36 seconds    Comment:        IF BASELINE aPTT IS ELEVATED, SUGGEST PATIENT RISK ASSESSMENT BE USED TO DETERMINE APPROPRIATE ANTICOAGULANT THERAPY. Performed at [facility], [address] [place], [place] [address]   Blood Culture (routine x 2)     Status: None (Preliminary result)   Collection Time: 06/02/23 12:34 AM   Specimen: BLOOD  Result Value Ref Range   Specimen Description BLOOD SITE NOT SPECIFIED    Special Requests      BOTTLES DRAWN AEROBIC AND ANAEROBIC Blood Culture results may not be optimal due to an inadequate volume of blood received in culture bottles   Culture      NO GROWTH < 12 HOURS Performed at [facility], [address] [place], [place] [address]    Report Status PENDING  Blood Culture (routine x 2)     Status: None (Preliminary  result)   Collection Time: 06/02/23 12:39 AM   Specimen: BLOOD  Result Value Ref Range   Specimen Description BLOOD SITE NOT SPECIFIED    Special Requests      BOTTLES DRAWN AEROBIC AND ANAEROBIC Blood Culture adequate volume   Culture      NO GROWTH < 12 HOURS Performed at [facility], [address] [place], [place] [address]    Report Status PENDING   I-Stat Lactic Acid, ED     Status: None   Collection Time: 06/02/23 12:42 AM  Result Value Ref Range   Lactic Acid, Venous 1.1 0.5 - 1.9 mmol/L  Hepatitis B surface antigen     Status: None   Collection Time: 06/02/23  2:03 AM  Result Value Ref Range   Hepatitis B Surface Ag NON REACTIVE NON REACTIVE    Comment: Performed at [facility], [address], [place], [place] [address]  Troponin I (High Sensitivity)     Status: Abnormal   Collection Time: 06/02/23 11:32 AM  Result Value Ref Range   Troponin I (High Sensitivity) 284 (HH) <18 ng/L    Comment: CRITICAL RESULT CALLED TO, READ BACK BY AND VERIFIED WITH B. Kortnie Stovall RN @1248  01.01.25 E.AHMED (NOTE) Elevated high sensitivity troponin I (hsTnI) values and significant  changes across serial measurements may suggest ACS but many other  chronic and acute conditions are known to elevate hsTnI results.  Refer to the Links section for chest pain algorithms and additional  guidance. Performed at [facility], [address] [place] [address] [place], [place] [address]   Renal function panel     Status: Abnormal   Collection Time: 06/02/23 11:32 AM  Result Value Ref Range   Sodium 135 135 - 145 mmol/L   Potassium 6.2 (H) 3.5 - 5.1 mmol/L   Chloride 95 (L) 98 - 111 mmol/L   CO2 23 22 - 32 mmol/L   Glucose, Bld 68 (L) 70 - 99 mg/dL    Comment: Glucose reference range applies only to samples taken after fasting for at least 8 hours.   BUN 51 (H) 8 - 23 mg/dL   Creatinine, Ser 1.05 (H) 0.44 - 1.00 mg/dL   Calcium  8.9 8.9 - 10.3 mg/dL   Phosphorus 6.5 (H) 2.5 - 4.6 mg/dL    Albumin  3.4 (L) 3.5 - 5.0 g/dL   GFR, Estimated 4 (L) >60 mL/min    Comment: (NOTE) Calculated using the CKD-EPI Creatinine Equation (2021)    Anion gap 17 (H) 5 - 15    Comment: Performed at [facility], [address], [place], [place] [address]  Troponin I (High Sensitivity)     Status: Abnormal   Collection Time: 06/02/23  3:33 PM  Result Value Ref Range   Troponin I (High Sensitivity) 1,476 (HH) <18 ng/L    Comment: CRITICAL VALUE NOTED. VALUE IS CONSISTENT WITH PREVIOUSLY REPORTED/CALLED VALUE (NOTE) Elevated high sensitivity troponin I (hsTnI) values and significant  changes across serial measurements may suggest ACS but many other  chronic and acute conditions are known to elevate hsTnI results.  Refer to the Links section for chest pain algorithms and additional  guidance. Performed at [facility], [address] [place], [place] [address]    *Note: Due to a large number of results and/or encounters for the requested time period, some results have not been displayed. A complete set of  results can be found in Results Review.   DG Chest Port 1 View Result Date: 06/02/2023 CLINICAL DATA:  Possible sepsis, initial encounter EXAM: PORTABLE CHEST 1 VIEW COMPARISON:  07/28/2022 FINDINGS: Cardiac shadow is enlarged but stable. Aortic calcifications and postoperative changes are again seen. Dialysis catheter is again noted on right stable from the prior exam. Lungs are well aerated bilaterally. New right basilar infiltrate is seen. IMPRESSION: New right basilar infiltrate. Electronically Signed   By: Oneil Devonshire M.D.   On: 06/02/2023 01:27   CT HEAD WO CONTRAST ( ) Result Date: 06/02/2023 CLINICAL DATA:  Altered mental status EXAM: CT HEAD WITHOUT CONTRAST TECHNIQUE: Contiguous axial images were obtained from the base of the skull through the vertex without intravenous contrast. RADIATION DOSE REDUCTION: This exam was performed according to the departmental  dose-optimization program which includes automated exposure control, adjustment of the mA and/or kV according to patient size and/or use of iterative reconstruction technique. COMPARISON:  02/23/2022 FINDINGS: Brain: No evidence of acute infarction, hemorrhage, hydrocephalus, extra-axial collection or mass lesion/mass effect. Mild atrophic changes and chronic white matter ischemic changes are seen. Findings consistent with prior left parietal infarct are noted. Vascular: No hyperdense vessel or unexpected calcification. Skull: Normal. Negative for fracture or focal lesion. Sinuses/Orbits: No acute finding. Other: None. IMPRESSION: Chronic atrophic and ischemic changes. No acute abnormality is noted. Electronically Signed   By: Oneil Devonshire M.D.   On: 06/02/2023 01:25    Pending Labs Unresulted Labs (From admission, onward)     Start     Ordered   06/03/23 0500  Renal function panel  Tomorrow morning,   R        06/02/23 1440   06/03/23 0500  CBC  Tomorrow morning,   R        06/02/23 1440   06/02/23 0141  Hepatitis B surface antibody,quantitative  (New Admission Hemo Labs (Hepatitis B))  Once,   URGENT        06/02/23 0141   Pending  MRSA Next Gen by PCR, Nasal  (MRSA Screening)  Once,   R        Pending   Signed and Held  Magnesium   Add-on,   R        Signed and Held   Signed and Held  Phosphorus  Add-on,   R        Signed and Held   Signed and Held  Comprehensive metabolic panel  Tomorrow morning,   R        Signed and Held   Signed and Held  CBC  Tomorrow morning,   R        Signed and Held            Vitals/Pain Today's Vitals   06/02/23 1500 06/02/23 1530 06/02/23 1600 06/02/23 1630  BP: (!) 191/139 (!) 204/134 (!) 204/119 (!) 181/116  Pulse:      Resp: (!) 22 (!) 22 (!) 23 (!) 25  Temp:      TempSrc:      SpO2:      PainSc:        Isolation Precautions No active isolations  Medications Medications  sodium zirconium cyclosilicate  (LOKELMA ) packet 10 g (10 g Oral  Given 06/02/23 1134)  acetaminophen  (TYLENOL ) tablet 650 mg (has no administration in time range)    Or  acetaminophen  (TYLENOL ) suppository 650 mg (has no administration in time range)  ceFEPIme  (MAXIPIME ) 1 g in sodium chloride  0.9 % 100 mL IVPB (  has no administration in time range)  vancomycin  (VANCOREADY) IVPB 500 mg/100 mL (has no administration in time range)  carvedilol  (COREG ) tablet 25 mg (has no administration in time range)  rosuvastatin  (CRESTOR ) tablet 10 mg (10 mg Oral Given 06/02/23 1133)  hydrALAZINE  (APRESOLINE ) tablet 100 mg (has no administration in time range)  apixaban  (ELIQUIS ) tablet 2.5 mg (has no administration in time range)  isosorbide  mononitrate (IMDUR ) 24 hr tablet 30 mg (has no administration in time range)    And  isosorbide  mononitrate (IMDUR ) 24 hr tablet 60 mg (has no administration in time range)  amLODipine  (NORVASC ) tablet 10 mg (has no administration in time range)  cholecalciferol  (VITAMIN D3) 25 MCG (1000 UNIT) tablet (has no administration in time range)  cloNIDine  (CATAPRES ) tablet 0.1 mg (has no administration in time range)  senna-docusate (Senokot-S) tablet 1 tablet (has no administration in time range)  ceFEPIme  (MAXIPIME ) 2 g in sodium chloride  0.9 % 100 mL IVPB (0 g Intravenous Stopped 06/02/23 0159)  metroNIDAZOLE  (FLAGYL ) IVPB 500 mg (0 mg Intravenous Stopped 06/02/23 0259)  vancomycin  (VANCOCIN ) IVPB 1000 mg/200 mL premix (0 mg Intravenous Stopped 06/02/23 1254)  sodium chloride  0.9 % bolus 500 mL (0 mLs Intravenous Stopped 06/02/23 0230)  calcium  gluconate inj 10% (1 g) URGENT USE ONLY! (1 g Intravenous Given 06/02/23 0119)  albuterol  (PROVENTIL ) (2.5 MG/3ML) 0.083% nebulizer solution 10 mg (10 mg Nebulization Given 06/02/23 0119)  sodium bicarbonate  injection 50 mEq (50 mEq Intravenous Given 06/02/23 0124)  insulin  aspart (novoLOG ) injection 5 Units (5 Units Intravenous Given 06/02/23 0119)    And  dextrose  50 % solution 50 mL (50 mLs Intravenous Given  06/02/23 0121)    Mobility walks with device     Focused Assessments Cardiac Assessment Handoff:  Cardiac Rhythm: Normal sinus rhythm Lab Results  Component Value Date   CKTOTAL 121 12/18/2009   CKMB 1.4 05/29/2009   TROPONINI 0.02        NO INDICATION OF MYOCARDIAL INJURY. 05/29/2009   Lab Results  Component Value Date   DDIMER 0.94 (H) 06/05/2021   Does the Patient currently have chest pain? No   , Renal Assessment Handoff:  Hemodialysis Schedule: Hemodialysis Schedule: Tuesday/Thursday/Saturday Last Hemodialysis date and time: 06/02/23   Restricted appendage: right arm  , Pulmonary Assessment Handoff:  Lung sounds:   O2 Device: Nasal Cannula O2 Flow Rate (L/min): 3 L/min    R Recommendations: See Admitting Provider Note  Report given to:   Additional Notes: Pt's sister was at bedside (very supportive and gives pt meds).  It appears pt missed dialysis because she did not have strength to pick up phone and let her sister know she was too weak to go.  She lives with boyfriend, but family states he is not supportive.

## 2023-06-03 ENCOUNTER — Inpatient Hospital Stay (HOSPITAL_COMMUNITY): Payer: Medicare Other

## 2023-06-03 DIAGNOSIS — J189 Pneumonia, unspecified organism: Secondary | ICD-10-CM | POA: Diagnosis not present

## 2023-06-03 DIAGNOSIS — E875 Hyperkalemia: Secondary | ICD-10-CM | POA: Diagnosis not present

## 2023-06-03 DIAGNOSIS — N186 End stage renal disease: Secondary | ICD-10-CM | POA: Diagnosis not present

## 2023-06-03 DIAGNOSIS — I5021 Acute systolic (congestive) heart failure: Secondary | ICD-10-CM

## 2023-06-03 DIAGNOSIS — J9601 Acute respiratory failure with hypoxia: Secondary | ICD-10-CM | POA: Diagnosis not present

## 2023-06-03 LAB — CBC WITH DIFFERENTIAL/PLATELET
Abs Immature Granulocytes: 0.01 10*3/uL (ref 0.00–0.07)
Basophils Absolute: 0.1 10*3/uL (ref 0.0–0.1)
Basophils Relative: 1 %
Eosinophils Absolute: 0.1 10*3/uL (ref 0.0–0.5)
Eosinophils Relative: 2 %
HCT: 35.7 % — ABNORMAL LOW (ref 36.0–46.0)
Hemoglobin: 11.4 g/dL — ABNORMAL LOW (ref 12.0–15.0)
Immature Granulocytes: 0 %
Lymphocytes Relative: 16 %
Lymphs Abs: 0.7 10*3/uL (ref 0.7–4.0)
MCH: 29.8 pg (ref 26.0–34.0)
MCHC: 31.9 g/dL (ref 30.0–36.0)
MCV: 93.5 fL (ref 80.0–100.0)
Monocytes Absolute: 0.9 10*3/uL (ref 0.1–1.0)
Monocytes Relative: 18 %
Neutro Abs: 3 10*3/uL (ref 1.7–7.7)
Neutrophils Relative %: 63 %
Platelets: 195 10*3/uL (ref 150–400)
RBC: 3.82 MIL/uL — ABNORMAL LOW (ref 3.87–5.11)
RDW: 17.7 % — ABNORMAL HIGH (ref 11.5–15.5)
WBC: 4.8 10*3/uL (ref 4.0–10.5)
nRBC: 0 % (ref 0.0–0.2)

## 2023-06-03 LAB — COMPREHENSIVE METABOLIC PANEL
ALT: 16 U/L (ref 0–44)
AST: 46 U/L — ABNORMAL HIGH (ref 15–41)
Albumin: 2.8 g/dL — ABNORMAL LOW (ref 3.5–5.0)
Alkaline Phosphatase: 50 U/L (ref 38–126)
Anion gap: 13 (ref 5–15)
BUN: 29 mg/dL — ABNORMAL HIGH (ref 8–23)
CO2: 25 mmol/L (ref 22–32)
Calcium: 8.5 mg/dL — ABNORMAL LOW (ref 8.9–10.3)
Chloride: 97 mmol/L — ABNORMAL LOW (ref 98–111)
Creatinine, Ser: 6.34 mg/dL — ABNORMAL HIGH (ref 0.44–1.00)
GFR, Estimated: 7 mL/min — ABNORMAL LOW (ref >60)
Glucose, Bld: 126 mg/dL — ABNORMAL HIGH (ref 70–99)
Potassium: 3.6 mmol/L (ref 3.5–5.1)
Sodium: 135 mmol/L (ref 135–145)
Total Bilirubin: 0.7 mg/dL (ref 0.0–1.2)
Total Protein: 5.9 g/dL — ABNORMAL LOW (ref 6.5–8.1)

## 2023-06-03 LAB — CBC
HCT: 38.7 % (ref 36.0–46.0)
Hemoglobin: 12.3 g/dL (ref 12.0–15.0)
MCH: 29.4 pg (ref 26.0–34.0)
MCHC: 31.8 g/dL (ref 30.0–36.0)
MCV: 92.4 fL (ref 80.0–100.0)
Platelets: 165 10*3/uL (ref 150–400)
RBC: 4.19 MIL/uL (ref 3.87–5.11)
RDW: 17.5 % — ABNORMAL HIGH (ref 11.5–15.5)
WBC: 3.7 10*3/uL — ABNORMAL LOW (ref 4.0–10.5)
nRBC: 0 % (ref 0.0–0.2)

## 2023-06-03 LAB — HEPATITIS B SURFACE ANTIBODY, QUANTITATIVE: Hep B S AB Quant (Post): 46.5 m[IU]/mL

## 2023-06-03 LAB — ECHOCARDIOGRAM COMPLETE
Area-P 1/2: 3.99 cm2
Est EF: 45
S' Lateral: 3.3 cm

## 2023-06-03 LAB — MRSA NEXT GEN BY PCR, NASAL: MRSA by PCR Next Gen: DETECTED — AB

## 2023-06-03 MED ORDER — SEVELAMER CARBONATE 2.4 G PO PACK
2.4000 g | PACK | Freq: Three times a day (TID) | ORAL | Status: DC
  Administered 2023-06-03 – 2023-06-06 (×6): 2.4 g via ORAL
  Filled 2023-06-03 (×11): qty 1

## 2023-06-03 MED ORDER — HEPARIN SODIUM (PORCINE) 1000 UNIT/ML IJ SOLN
4000.0000 [IU] | Freq: Once | INTRAMUSCULAR | Status: AC
Start: 2023-06-03 — End: 2023-06-03
  Administered 2023-06-03: 4000 [IU]
  Filled 2023-06-03: qty 4

## 2023-06-03 MED ORDER — SODIUM CHLORIDE 0.9 % IV SOLN
1.5000 g | Freq: Two times a day (BID) | INTRAVENOUS | Status: DC
  Administered 2023-06-03 – 2023-06-06 (×6): 1.5 g via INTRAVENOUS
  Filled 2023-06-03 (×7): qty 4

## 2023-06-03 NOTE — TOC CM/SW Note (Signed)
 Transition of Care [facility]) - Inpatient Brief Assessment   Patient Details  Name: Monique Henderson MRN: [id number] Date of Birth: 19-Aug-1950  [facility] CM/SW Contact:    Inocente GORMAN Kindle, LCSW Phone Number: 06/03/2023, 3:12 PM   Clinical Narrative: Patient admitted from home missing outpatient dialysis. She utilizes [facility] for transportation. No current TOC needs identified at this time but please place consult if needs arise.    Transition of Care Asessment: Insurance and Status: Insurance coverage has been reviewed Patient has primary care physician: Yes Home environment has been reviewed: From home Prior level of function:: Mod Indep Prior/Current Home Services: No current home services Social Drivers of Health Review: SDOH reviewed no interventions necessary Readmission risk has been reviewed: Yes Transition of care needs: no transition of care needs at this time

## 2023-06-03 NOTE — Evaluation (Signed)
 Clinical/Bedside Swallow Evaluation Patient Details  Name: Monique Henderson MRN: [id number] Date of Birth: 1950-10-30  Today's Date: 06/03/2023 Time: SLP Start Time (ACUTE ONLY): 1049 SLP Stop Time (ACUTE ONLY): 1059 SLP Time Calculation (min) (ACUTE ONLY): 10 min  Past Medical History:  Past Medical History:  Diagnosis Date   Acute cardioembolic stroke (HCC) 02/26/2022   AF (paroxysmal atrial fibrillation) (HCC) 05/29/2019   on Coumadin    Aortic atherosclerosis (HCC) 07/05/2019   Aortic dissection (HCC) 04/04/2019   s/p repair   Bone spur 2008   Right calcaneal foot spur   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 Deward Null; S/P re-excision of superior and inferior margins 10/27/2012.    Dissection of aorta (HCC) 04/03/2019   ESRD on hemodialysis (HCC) 05/29/2019   Essential hypertension 09/16/2006   GERD 09/16/2006   Hepatitis C    treated and RNA confirmed not detectable 01/2017   Insomnia 03/14/2015   Malnutrition of moderate degree 05/19/2019   Non compliance with medical treatment 12/04/2017   Normocytic anemia    With thrombocytosis   Osteoarthritis    Right ureteral stone 2002   S/P lumbar spinal fusion 01/18/2014   S/P lumbar decompressive laminectomy, fusion, and plating for lumbar spinal stenosis on 05/27/2009 by Dr. Alm CANDIE Molt.  S/P anterolateral retroperitoneal interbody fusion L2-3 utilizing a 8 mm peek interbody cage packed with morcellized allograft, and anterior lumbar plating  L2-3 for recurrent disc herniation L2-3 with spinal stenosis on 01/18/2014 by Dr. Alm CANDIE Molt.     Stroke (cerebrum) (HCC)    Stroke (HCC) 02/23/2022   SVT (supraventricular tachycardia) (HCC) 08/28/2019   Tobacco use disorder 04/19/2009   Uterine fibroid    Wears dentures    top   Past Surgical History:  Past Surgical History:  Procedure Laterality Date   ANTERIOR LAT LUMBAR FUSION N/A 01/18/2014   Procedure: ANTERIOR LATERAL LUMBAR FUSION LUMBAR TWO-THREE;  Surgeon: Alm GORMAN Molt, MD;  Location: [facility];  Service: Neurosurgery;  Laterality: N/A;   ANTERIOR LUMBAR FUSION  01/18/2014   AV FISTULA PLACEMENT Left 04/20/2019   Procedure: ARTERIOVENOUS (AV) FISTULA CREATION;  Surgeon: Sheree Penne Bruckner, MD;  Location: [facility] OR;  Service: Vascular;  Laterality: Left;   BACK SURGERY     BREAST LUMPECTOMY Left 01/2003   BREAST LUMPECTOMY Right 2014   BREAST LUMPECTOMY WITH NEEDLE LOCALIZATION AND AXILLARY SENTINEL LYMPH NODE BX Right 10/13/2012   Procedure: BREAST LUMPECTOMY WITH NEEDLE LOCALIZATION;  Surgeon: Deward GORMAN Null DOUGLAS, MD;  Location: [facility];  Service: General;  Laterality: Right;  Right breast wire localized lumpectomy   DIALYSIS/PERMA CATHETER INSERTION N/A 05/28/2023   Procedure: DIALYSIS/PERMA CATHETER INSERTION;  Surgeon: Melia Lynwood ORN, MD;  Location: [facility];  Service: Cardiovascular;  Laterality: N/A;   INSERTION OF DIALYSIS CATHETER Right 04/20/2019   Procedure: INSERTION OF DIALYSIS CATHETER, right internal jugular;  Surgeon: Sheree Penne Bruckner, MD;  Location: [facility] OR;  Service: Vascular;  Laterality: Right;   INSERTION OF DIALYSIS CATHETER Right  09/24/2020   Procedure: INSERTION OF TUNNELED DIALYSIS CATHETER;  Surgeon: Oris Krystal FALCON, MD;  Location: [facility] OR;  Service: Vascular;  Laterality: Right;   IR FLUORO GUIDE CV LINE RIGHT  09/22/2020   IR THORACENTESIS ASP PLEURAL SPACE W/IMG GUIDE  05/19/2019   IR US  GUIDE VASC ACCESS LEFT   09/22/2020   IR US  GUIDE VASC ACCESS RIGHT  09/22/2020   IR VENOCAVAGRAM SVC  09/22/2020   LAMINECTOMY  05/27/2009   Lumbar decompressive laminectomy, fusion and plating for lumbar spinal stensosis   LIGATION OF ARTERIOVENOUS  FISTULA Left 09/24/2020   Procedure: LIGATION OF LEFT ARM ARTERIOVENOUS  FISTULA;  Surgeon: Oris Krystal FALCON, MD;  Location: [facility] OR;  Service: Vascular;  Laterality: Left;   LUMBAR LAMINECTOMY/DECOMPRESSION MICRODISCECTOMY Left 03/23/2013   Procedure: LUMBAR LAMINECTOMY/DECOMPRESSION MICRODISCECTOMY 1 LEVEL;  Surgeon: Alm GORMAN Molt, MD;  Location: [facility];  Service: Neurosurgery;  Laterality: Left;  LUMBAR LAMINECTOMY/DECOMPRESSION MICRODISCECTOMY 1 LEVEL   MASTECTOMY, PARTIAL Left 02/26/2003   ; S/P re-excision of cranial and lateral margins 04/19/2003.    RE-EXCISION OF BREAST CANCER,SUPERIOR MARGINS Right 10/27/2012   Procedure: RE-EXCISION OF BREAST CANCER,SUPERIOR and inferior MARGINS;  Surgeon: Deward GORMAN Curvin DOUGLAS, MD;  Location: [facility] OR;  Service: General;  Laterality: Right;   RE-EXCISION OF BREAST LUMPECTOMY Left 04/2003   TEE WITHOUT CARDIOVERSION N/A 04/04/2019   Procedure: Transesophageal Echocardiogram (Tee);  Surgeon: German Bartlett PEDLAR, MD;  Location: [facility] OR;  Service: Open Heart Surgery;  Laterality: N/A;   THORACIC AORTIC ANEURYSM REPAIR N/A 04/04/2019   Procedure: THORACIC ASCENDING ANEURYSM REPAIR (AAA)  USING 28 MM X 30 CM HEMASHIELD PLATINUM VASCULAR GRAFT;  Surgeon: German Bartlett PEDLAR, MD;  Location: [facility] OR;  Service: Open Heart Surgery;  Laterality: N/A;   HPI:  Monique Henderson is a 73 y.o. female admitted with acute hypoxic respiratory failure. She has a medical history significant of ESRD on hemodialysis (TTS), A-fib on Eliquis , hypertension, hyperlipidemia, COPD, history of right breast cancer status postlumpectomy in 2014, GERD, hepatitis C treated in 2018, moderate protein calorie malnutrition, history of stroke with right-sided deficit, chronic lower back  pain status post lumbar spinal fusion surgery in 2010 presented to ER with altered mental status and respiratory distress.    Assessment / Plan / Recommendation  Clinical Impression   Pt presents with a functional oropharyngeal swallow per clinical swallow assessment completed today. OME significant for residual R sided labial weakness from prior CVA and absent lower dentition (pt reported left at home). Pt benefited from additional time for oral prep and transit of regular solid.  Pharyngeal swallow initiation appeared timely with laryngeal elevation noted. No overt or subtle s/s of aspiration noted across consistencies.   Recommend continue current diet as tolerated and PO meds as tolerated. No acute SLP needs identified at this time. SLP will sign off. Please re-consult if pt exhibits concerns for aspiration with PO intake.   SLP Visit Diagnosis:  (r/o dysphagia)    Aspiration Risk  No limitations    Diet Recommendation Regular;Thin liquid    Liquid Administration via: Cup;Straw Medication Administration: Whole meds with liquid Supervision: Patient able to self feed Compensations: Slow rate;Small sips/bites Postural Changes: Seated upright at 90 degrees    Other  Recommendations Oral Care Recommendations: Oral care BID    Recommendations for follow up therapy are one component of a multi-disciplinary discharge planning process, led by the attending physician.  Recommendations may be updated based on patient status, additional functional criteria and  insurance authorization.  Follow up Recommendations No SLP follow up      Assistance Recommended at Discharge  Defer to PT/OT recommendations  Functional Status Assessment Patient has not had a recent decline in their functional status    Swallow Study   General Date of Onset: 06/02/23 HPI: ERINE PHENIX is a 73 y.o. female admitted with acute hypoxic respiratory failure. She has a medical history significant of ESRD on  hemodialysis (TTS), A-fib on Eliquis , hypertension, hyperlipidemia, COPD, history of right breast cancer status postlumpectomy in 2014, GERD, hepatitis C treated in 2018, moderate protein calorie malnutrition, history of stroke with right-sided deficit, chronic lower back pain status post lumbar spinal fusion surgery in 2010 presented to ER with altered mental status and respiratory distress. Type of Study: Bedside Swallow Evaluation Previous Swallow Assessment: clinical swallow assessment during prior hospitalization on 04/30/22 with recommendation of puree diet and thin liquids; pt discharged from IPR admission in 03/2022 on a regular diet and thin liquids following hospitalization for CVA Diet Prior to this Study: Regular;Thin liquids (Level 0) Temperature Spikes Noted: No Respiratory Status: Nasal cannula (2L) History of Recent Intubation: No Behavior/Cognition: Alert;Cooperative;Pleasant mood Oral Cavity Assessment: Within Functional Limits Oral Cavity - Dentition: Dentures, top (absent lower dentition- pt reported dentures are at home) Self-Feeding Abilities: Able to feed self Patient Positioning: Upright in bed Baseline Vocal Quality: Normal Volitional Swallow: Able to elicit    Oral/Motor/Sensory Function Overall Oral Motor/Sensory Function: Mild impairment (baseline deficits post CVA) Facial ROM: Reduced right Facial Strength: Reduced right   Ice Chips Ice chips: Not tested   Thin Liquid Thin Liquid: Within functional limits Presentation: Cup;Self Fed    Nectar Thick Nectar Thick Liquid: Not tested   Honey Thick Honey Thick Liquid: Not tested   Puree Puree: Within functional limits Presentation: Self Fed   Solid     Solid: Within functional limits Presentation: Self Fed      Peyton JINNY Rummer 06/03/2023,11:28 AM

## 2023-06-03 NOTE — Progress Notes (Signed)
  Echocardiogram 2D Echocardiogram has been performed.  Delcie Roch 06/03/2023, 4:43 PM

## 2023-06-03 NOTE — Progress Notes (Addendum)
 Patient Name: Monique Henderson Date of Encounter: 06/03/2023 [facility] Cardiologist: Candyce Reek, MD   Interval Summary  .    Patient reports feeling OK today. Denies chest pain, palpitations. Breathing has returned to baseline. She admits to missing 2 appointments for HD prior to admission. She also admits to occasionally missing doses of her home medications because she forgets to take them.   Vital Signs .    Vitals:   06/03/23 0500 06/03/23 0515 06/03/23 0520 06/03/23 0650  BP: (!) 151/92 (!) 150/90 (!) 162/93 (!) 152/99  Pulse: 61 (!) 50 83 92  Resp: 19 18 17 18   Temp:    98.7 F (37.1 C)  TempSrc:    Oral  SpO2: 100% 100% 100% 100%    Intake/Output Summary (Last 24 hours) at 06/03/2023 0744 Last data filed at 06/03/2023 0520 Gross per 24 hour  Intake 1254.12 ml  Output 1500 ml  Net -245.88 ml      06/03/2023    6:58 AM 05/29/2023    7:22 AM 05/28/2023    7:45 PM  Last 3 Weights  Weight (lbs) -- 106 lb 106 lb  Weight (kg) -- 48.081 kg 48.081 kg      Telemetry/ECG    NSR with PACs, brief runs of atrial tachycardia. HR in the 90s - Personally Reviewed  Physical Exam .   GEN: Frail, elderly female. Sitting upright in the bed. No acute distress. Appears weak Neck: No JVD Cardiac: RRR. Faint systolic murmur present  Respiratory: Crackles in bilateral lung bases. Normal work of breathing on 2 L via [place]  GI: Soft, nontender, non-distended  MS: No edema in BLE   Assessment & Plan .     Paroxysmal Atrial Flutter  RBBB - Patient had recurrence of atrial flutter yesterday after dialysis. Now back in NSR with PACs. Has chronic RBBB  - Continue carvedilol  25 mg BID - Continue eliquis  2.5 mg BID - dose adjusted for weight, renal function   Chronic Systolic Heart Failure  Ischemic cardiomyopathy  Mild MR  Moderate TR  - Most recent echocardiogram from 06/2022 showed EF 45-50%, severe LVH, grade II DD, normal RV function, mild MR, moderate TR. With  severe LVH and distal septal hypokinesis, recommended consideration of further workup for amyloid. With her ESRD and poor compliance with HD, she was not a candidate for cMRI at the time.  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 Also has a history of cocaine  use, though has not used recently  - Continue carvedilol  25 mg BID, hydralazine  100 mg TID, imdur   - Updating echocardiogram this admission  - Volume managed by HD   ESRD Hyperkalemia  - Potassium was >7.5 on 1/1. Underwent urgent dialysis  - Nephrology consulted- patient's last HD session was 12/26. Seems like compliance with dialysis is an issue, admits to missing at least 2 appointments PTA (missed Saturday, Monday. Presented to the ED Wednesday)   CAD  Elevated Troponin  - Nuclear stress test from 06/2021 showed no reversible ischemia, moderate apical scarring/infarct  - hsTn elevated [id number]  - Suspect trop elevation is due to rapid atrial flutter. She is not having chest pain  - Patient is noncompliant with medications and HD. She also is quite debilitated from her prior stroke. Recommend conservative/medcal management  - Echo pending - even if she has further reduced EF  or WMAs, likely would not be a candidate for cath given her noncompliance with HD and medications  - not on ASA as she is on eliquis  for PAF  - Continue carvedilol  25 mg BID  - Continue crestor  10 mg daily   Ascending aortic dissection s/p endovascular repair  - CT from 12/2022 showed unchanged contour and caliber of the thoracic aorta s/p graft repair of the ascending thoracic aorta - Continue to follow as an outpatient   HTN  - Continue amlodipine  10 mg daily, carvedilol  25 mg BID, clonidine  0.1 mg BID, hydralazine  100 mg TID, imdur  30 mg on HD days and 60 mg daily on non HD days  - BP remains elevated- up to 162/93 this AM.  However, unclear if she was taking her medications prior to admission. Home medications resumed yesterday  - Follow BP today and adjust medications as needed   Medication Noncompliance  - Patient reports occasionally forgetting to take her home medications. Discussed the importance of medication compliance   For questions or updates, please contact  [facility] Please consult [email or web address] for contact info under  Signed, Rollo FABIENE Louder, PA-C   Patient seen and examined, note reviewed with the signed Advanced Practice Provider. I personally reviewed laboratory data, imaging studies and relevant notes. I independently examined the patient and formulated the important aspects of the plan. I have personally discussed the plan with the patient and/or family. Comments or changes to the note/plan are indicated below.  Paroxysmal atrial flutter  RBBB  Chronic systolic heart failure  CAD with elevated trop  Mild mitral regurgitation  Moderate Tricuspid regurgitation   She is now back in sinus rhythm with occasional PAC. Continue the coreg  25 mg BID and Renal dose Eliquis  2.5 mg BID. Her antihypertensive restarted, will titrate up as appropriate.  HD per nephro In terms of her elevated trop it will be best to pursue medical management for now.  Echo pending.    Wrigley Winborne DO, MS [facility] Attending Cardiologist [facility]  [address] [place], [place] [address] [phone] Website: [email or web address]

## 2023-06-03 NOTE — Progress Notes (Signed)
   06/03/23 0520  Vitals  BP (!) 162/93  MAP (mmHg) 115  BP Location Left Arm  BP Method Automatic  Patient Position (if appropriate) Lying  Pulse Rate 83  Pulse Rate Source Monitor  ECG Heart Rate 80  Resp 17  Oxygen  Therapy  SpO2 100 %  O2 Flow Rate (L/min) 2 L/min  During Treatment Monitoring  Blood Flow Rate (mL/min) 0 mL/min  Arterial Pressure (mmHg) -30.7 mmHg  Venous Pressure (mmHg) 28.48 mmHg  TMP (mmHg) 6.06 mmHg  Ultrafiltration Rate (mL/min) 393 mL/min  Dialysate Flow Rate (mL/min) 299 ml/min  Dialysate Potassium Concentration 2  Dialysate Calcium  Concentration 2.5  Duration of HD Treatment -hour(s) 3 hour(s)  Cumulative Fluid Removed (mL) per Treatment  1500.06  HD Safety Checks Performed Yes  Intra-Hemodialysis Comments Tx completed  Post Treatment  Dialyzer Clearance Lightly streaked  Liters Processed 54  Fluid Removed (mL) 1500 mL  Tolerated HD Treatment Yes  Post-Hemodialysis Comments Pt stable  Hemodialysis Catheter Right Subclavian Double lumen Permanent (Tunneled)  Placement Date/Time: 05/28/23 0942   Placed prior to admission: No  Serial / Lot #: [id number]  Expiration Date: 04/01/27  Time Out: Correct patient;Correct site;Correct procedure  Maximum sterile barrier precautions: Sterile gloves;Sterile gown;Mask;C...  Site Condition No complications  Blue Lumen Status Flushed;Saline locked  Red Lumen Status Flushed  Dressing Status Antimicrobial disc in place;Clean, Dry, Intact  Drainage Description None  Dressing Change Due 06/09/23  Post treatment catheter status Capped and Clamped   Dialysis uneventful.

## 2023-06-03 NOTE — Progress Notes (Addendum)
 [facility] Progress Note   Subjective:  Seen in room, reports she is feeling a bit better. Denies SOB, CP, dizziness, nausea.   Objective Vitals:   06/03/23 0515 06/03/23 0520 06/03/23 0650 06/03/23 0800  BP: (!) 150/90 (!) 162/93 (!) 152/99 (!) 158/78  Pulse: (!) 50 83 92 80  Resp: 18 17 18 17   Temp:   98.7 F (37.1 C) 98.5 F (36.9 C)  TempSrc:   Oral Oral  SpO2: 100% 100% 100% 100%   Physical Exam General: Alert female in NAD Heart: irregularly irregular rhythm, no murmur Lungs: CTA bilaterally, respirations unlabored Abdomen: Soft, non-distended, +BS Extremities: No edema b/l lower extremities Dialysis Access:  R internal jugular [facility]  Additional Objective Labs: Basic Metabolic Panel: Recent Labs  Lab 06/02/23 0034 06/02/23 1132 06/02/23 1916 06/03/23 0816  NA 136 135  --  135  K >7.5* 6.2*  --  3.6  CL 96* 95*  --  97*  CO2 18* 23  --  25  GLUCOSE 99 68*  --  126*  BUN 124* 51*  --  29*  CREATININE 16.81* 8.94*  --  6.34*  CALCIUM  9.6 8.9  --  8.5*  PHOS  --  6.5* 7.2*  --    Liver Function Tests: Recent Labs  Lab 06/02/23 0034 06/02/23 1132 06/03/23 0816  AST 18  --  46*  ALT 10  --  16  ALKPHOS 72  --  50  BILITOT 0.9  --  0.7  PROT 8.0  --  5.9*  ALBUMIN  4.3 3.4* 2.8*   No results for input(s): LIPASE, AMYLASE in the last 168 hours. CBC: Recent Labs  Lab 05/28/23 2056 06/02/23 0034 06/03/23 0200 06/03/23 0816  WBC 3.7* 6.4 4.8 3.7*  NEUTROABS 2.3 5.2 3.0  --   HGB 12.3 14.1 11.4* 12.3  HCT 39.5 45.5 35.7* 38.7  MCV 97.1 96.0 93.5 92.4  PLT 188 245 195 165   Blood Culture    Component Value Date/Time   SDES BLOOD SITE NOT SPECIFIED 06/02/2023 0039   SPECREQUEST  06/02/2023 0039    BOTTLES DRAWN AEROBIC AND ANAEROBIC Blood Culture adequate volume   CULT  06/02/2023 0039    NO GROWTH 1 DAY Performed at [facility], [address] [place], [place] [address]    REPTSTATUS PENDING 06/02/2023 9960     Cardiac Enzymes: No results for input(s): CKTOTAL, CKMB, CKMBINDEX, TROPONINI in the last 168 hours. CBG: No results for input(s): GLUCAP in the last 168 hours. Iron Studies: No results for input(s): IRON, TIBC, TRANSFERRIN, FERRITIN in the last 72 hours. @lablastinr3 @ Studies/Results: DG Chest Port 1 View Result Date: 06/02/2023 CLINICAL DATA:  Possible sepsis, initial encounter EXAM: PORTABLE CHEST 1 VIEW COMPARISON:  07/28/2022 FINDINGS: Cardiac shadow is enlarged but stable. Aortic calcifications and postoperative changes are again seen. Dialysis catheter is again noted on right stable from the prior exam. Lungs are well aerated bilaterally. New right basilar infiltrate is seen. IMPRESSION: New right basilar infiltrate. Electronically Signed   By: Oneil Devonshire M.D.   On: 06/02/2023 01:27   CT HEAD WO CONTRAST ( ) Result Date: 06/02/2023 CLINICAL DATA:  Altered mental status EXAM: CT HEAD WITHOUT CONTRAST TECHNIQUE: Contiguous axial images were obtained from the base of the skull through the vertex without intravenous contrast. RADIATION DOSE REDUCTION: This exam was performed according to the departmental dose-optimization program which includes automated exposure control, adjustment of the mA and/or kV according to patient size and/or use  of iterative reconstruction technique. COMPARISON:  02/23/2022 FINDINGS: Brain: No evidence of acute infarction, hemorrhage, hydrocephalus, extra-axial collection or mass lesion/mass effect. Mild atrophic changes and chronic white matter ischemic changes are seen. Findings consistent with prior left parietal infarct are noted. Vascular: No hyperdense vessel or unexpected calcification. Skull: Normal. Negative for fracture or focal lesion. Sinuses/Orbits: No acute finding. Other: None. IMPRESSION: Chronic atrophic and ischemic changes. No acute abnormality is noted. Electronically Signed   By: Oneil Devonshire M.D.   On: 06/02/2023 01:25    Medications:  ampicillin -sulbactam (UNASYN ) IV      amLODipine   10 mg Oral Daily   apixaban   2.5 mg Oral BID   carvedilol   25 mg Oral BID WC   cholecalciferol    Oral Daily   cloNIDine   0.1 mg Oral BID   hydrALAZINE   100 mg Oral Q8H   isosorbide  mononitrate  30 mg Oral Q T,Th,Sat-1800   And   [START ON 06/04/2023] isosorbide  mononitrate  60 mg Oral Q M,W,F,Su-1800   pantoprazole   40 mg Oral BID   rosuvastatin   10 mg Oral Daily   sodium zirconium cyclosilicate   10 g Oral Daily    Dialysis Orders: Outpatient HD prescription: THS, 3.5 hours, 2K, 2 calcium , no heparin , TDC, EDW 46 kg, BFR 400, DFR A1.5.  Hectorol  9 mcg every treatment.  Sensipar  60 mg every treatment.  Last Mircera 12/10.   Assessment/Plan: 1. Acute hypoxic respiratory failure: concern for aspiration pneumonia, on abx per primary team. Improved somewhat after dialysis 2. ESRD: Azotemia/hyperkalemia on admission due to missed HD.  Resolved with emergent HD 1/1 and again early this AM,  K+ 3.6 today. Stopping daily lokelma  for now. Continue TTS schedule.  3. HTN/volume:  BP Improving, continue home meds and UF with HD as tolerated. About 2kg over her EDW at present 4. Anemia: Hgb above goal, no ESA indicated 5. Secondary hyperparathyroidism:  Calcium  controlled, phos elevated. Resume renvela  6. Nutrition:  Renal diet with fluid restrictions  Lucie Collet, PA-C 06/03/2023, 10:38 AM  [facility] Pager: [phone] ]

## 2023-06-03 NOTE — Progress Notes (Addendum)
 Pharmacy Antibiotic Note  Monique Henderson is a 73 y.o. female admitted on 06/02/2023 with aspiration pneumonia.  Pharmacy has been consulted for ampicillin /sulbactam dosing.  Plan: ampicillin /sulbactam 1.5g q12hr x5 days per MD   Weight:  (bed weight not functioning.)  Temp (24hrs), Avg:98.6 F (37 C), Min:98.3 F (36.8 C), Max:99.6 F (37.6 C)  Recent Labs  Lab 05/28/23 2056 06/02/23 0034 06/02/23 0042 06/02/23 1132 06/03/23 0200 06/03/23 0816  WBC 3.7* 6.4  --   --  4.8 3.7*  CREATININE 8.86* 16.81*  --  8.94*  --  6.34*  LATICACIDVEN  --   --  1.1  --   --   --     Estimated Creatinine Clearance: 6.3 mL/min (A) (by C-G formula based on SCr of 6.34 mg/dL (H)).    Allergies  Allergen Reactions   Shrimp [Shellfish Allergy] Shortness Of Breath   Bactroban  [Mupirocin ] Other (See Comments)    Sores in nose   Hydrocodone  Itching, Nausea Only and Nausea And Vomiting   Chlorhexidine  Gluconate Itching   Tylenol  [Acetaminophen ] Itching    Takes Percocet at home 05/06/23   Zestril  [Lisinopril ] Cough    Antimicrobials this admission: Ampsulb 1/2 >> 1/6 Cefe 1/1 MTZ 1/1 Vanc 1/1     Microbiology results: 1/1 BCx: ngtd  1/2 MRSA PCR:   Thank you for allowing pharmacy to be a part of this patient's care.  Jinnie Door, PharmD, BCPS, BCCP Clinical Pharmacist  Please check AMION for all [facility] phone numbers After 10:00 PM, call Main Pharmacy [phone]

## 2023-06-03 NOTE — Progress Notes (Signed)
 PROGRESS NOTE        PATIENT DETAILS Name: Monique Henderson Age: 73 y.o. Sex: female Date of Birth: Apr 21, 1951 Admit Date: 06/02/2023 Admitting Physician Velna JONELLE Skeeter, MD ERE:Jcalzmz, Aliene, MD  Brief Summary: Patient is a 73 y.o.  female with history of ESRD on HD TTS, A-fib, CVA with residual dysarthria/right hemiparesis, HTN, HLD, COPD who presented with confusion and respiratory distress-she was found to have hyperkalemia and RLL PNA.  Significant events: 1/1>> admit to [facility]  Significant studies: 1/1>> CT head: Chronic atrophy/ischemic changes. 1/1>> CXR: New right basilar infiltrate.  Significant microbiology data: 1/1>> COVID/influenza/RSV PCR: Negative 1/1>> blood culture: No growth  Procedures: None  Consults: Nephrology Cardiology  Subjective: Lying comfortably in bed-denies any chest pain or shortness of breath.  Speech at baseline-some amount of dysarthria.  Objective: Vitals: Blood pressure (!) 158/78, pulse 80, temperature 98.5 F (36.9 C), temperature source Oral, resp. rate 17, SpO2 100%.   Exam: Gen Exam:Alert awake-not in any distress HEENT:atraumatic, normocephalic Chest: B/L clear to auscultation anteriorly CVS:S1S2 regular Abdomen:soft non tender, non distended Extremities:no edema Neurology: Mild right-sided hemiparesis. Skin: no rash  Pertinent Labs/Radiology:    Latest Ref Rng & Units 06/03/2023    2:00 AM 06/02/2023   12:34 AM 05/28/2023    8:56 PM  CBC  WBC 4.0 - 10.5 K/uL 4.8  6.4  3.7   Hemoglobin 12.0 - 15.0 g/dL 88.5  85.8  87.6   Hematocrit 36.0 - 46.0 % 35.7  45.5  39.5   Platelets 150 - 400 K/uL 195  245  188     Lab Results  Component Value Date   NA 135 06/03/2023   K 3.6 06/03/2023   CL 97 (L) 06/03/2023   CO2 25 06/03/2023      Assessment/Plan: Acute hypoxemic respiratory failure secondary to right lobar pneumonia Improved-down to 2 L of oxygen  (on 5 L yesterday per H&P) Cultures  negative to date Suspect this is aspiration pneumonia-will stop vancomycin /cefepime  and transition to Unasyn . SLP evaluation to ensure no overt issues with swallowing-she has a history of CVA and dysarthria.  Hyperkalemia Due to missed HD on 12/28 Treated with HD K levels have normalized.  ESRD on HD-TTS Underwent HD on 1/1-and again earlier this morning Nephrology following  Hypertensive emergency Secondary to missed HD BP better  Elevated troponin levels Known history of CAD Elevated troponin levels likely reflecting of demand ischemia Cardiology recommending medical management.  Chronic HFrEF Euvolemic Volume removal with HD On beta-blocker/hydralazine /Imdur .  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   Weight as of 05/29/23: 48.1 kg.   Code status:   Code Status: Full Code   DVT Prophylaxis: SCDs Start: 06/02/23 1751 apixaban  (ELIQUIS ) tablet 2.5 mg Start: 06/02/23 1100 apixaban  (ELIQUIS ) tablet 2.5 mg    Family Communication: Sister-Shirley Bass-[phone]-updated 1/2   Disposition Plan: Status is: Inpatient Remains inpatient appropriate because: Severity of illness   Planned Discharge Destination:Home health   Diet: Diet Order             Diet  renal with fluid restriction Fluid restriction: 1800 mL Fluid; Room service appropriate? Yes; Fluid consistency: Thin  Diet effective now                     Antimicrobial agents: Anti-infectives (From admission, onward)    Start     Dose/Rate Route Frequency Ordered Stop   06/03/23 2000  ceFEPIme   (MAXIPIME ) 1 g in sodium chloride  0.9 % 100 mL IVPB        1 g 200 mL/hr over 30 Minutes Intravenous Daily 06/02/23 1032     06/03/23 1200  vancomycin  (VANCOREADY) IVPB 500 mg/100 mL        500 mg 100 mL/hr over 60 Minutes Intravenous Every T-Th-Sa (Hemodialysis) 06/02/23 1032     06/02/23 0045  ceFEPIme  (MAXIPIME ) 2 g in sodium chloride  0.9 % 100 mL IVPB        2 g 200 mL/hr over 30 Minutes Intravenous  Once 06/02/23 0034 06/02/23 0159   06/02/23 0045  metroNIDAZOLE  (FLAGYL ) IVPB 500 mg        500 mg 100 mL/hr over 60 Minutes Intravenous  Once 06/02/23 0034 06/02/23 0259   06/02/23 0045  vancomycin  (VANCOCIN ) IVPB 1000 mg/200 mL premix        1,000 mg 200 mL/hr over 60 Minutes Intravenous  Once 06/02/23 0034 06/02/23 1254        MEDICATIONS: Scheduled Meds:  amLODipine   10 mg Oral Daily   apixaban   2.5 mg Oral BID   carvedilol   25 mg Oral BID WC   cholecalciferol    Oral Daily   cloNIDine   0.1 mg Oral BID   hydrALAZINE   100 mg Oral Q8H   isosorbide  mononitrate  30 mg Oral Q T,Th,Sat-1800   And   [START ON 06/04/2023] isosorbide  mononitrate  60 mg Oral Q M,W,F,Su-1800   pantoprazole   40 mg Oral BID   rosuvastatin   10 mg Oral Daily   sodium zirconium cyclosilicate   10 g Oral Daily   Continuous Infusions:  ceFEPime  (MAXIPIME ) IV     vancomycin      PRN Meds:.acetaminophen  **OR** acetaminophen , metoprolol  tartrate, ondansetron  **OR** ondansetron  (ZOFRAN ) IV, senna-docusate   I have personally reviewed following labs and imaging studies  LABORATORY DATA: CBC: Recent Labs  Lab 05/28/23 2056 06/02/23 0034 06/03/23 0200  WBC 3.7* 6.4 4.8  NEUTROABS 2.3 5.2 3.0  HGB 12.3 14.1 11.4*  HCT 39.5 45.5 35.7*  MCV 97.1 96.0 93.5  PLT 188 245 195    Basic Metabolic Panel: Recent Labs  Lab 05/28/23 2056 06/02/23 0034 06/02/23 1132 06/02/23 1916 06/03/23 0816  NA 141 136 135  --  135  K 5.2* >7.5* 6.2*  --  3.6  CL 100 96* 95*  --  97*  CO2 23 18* 23  --  25  GLUCOSE  91 99 68*  --  126*  BUN 48* 124* 51*  --  29*  CREATININE 8.86* 16.81* 8.94*  --  6.34*  CALCIUM  8.5* 9.6 8.9  --  8.5*  MG  --   --   --  2.1  --   PHOS  --   --  6.5* 7.2*  --     GFR: Estimated Creatinine Clearance: 6.3 mL/min (A) (by C-G formula based on SCr of 6.34 mg/dL (H)).  Liver Function Tests: Recent Labs  Lab 06/02/23 0034 06/02/23 1132 06/03/23 0816  AST 18  --  46*  ALT 10  --  16  ALKPHOS 72  --  50  BILITOT 0.9  --  0.7  PROT 8.0  --  5.9*  ALBUMIN  4.3 3.4* 2.8*   No results for input(s): LIPASE, AMYLASE in the last 168 hours. No results for input(s): AMMONIA in the last 168 hours.  Coagulation Profile: Recent Labs  Lab 06/02/23 0034  INR 1.1    Cardiac Enzymes: No results for input(s): CKTOTAL, CKMB, CKMBINDEX, TROPONINI in the last 168 hours.  BNP (last 3 results) No results for input(s): PROBNP in the last 8760 hours.  Lipid Profile: No results for input(s): CHOL, HDL, LDLCALC, TRIG, CHOLHDL, LDLDIRECT in the last 72 hours.  Thyroid  Function Tests: No results for input(s): TSH, T4TOTAL, FREET4, T3FREE, THYROIDAB in the last 72 hours.  Anemia Panel: No results for input(s): VITAMINB12, FOLATE, FERRITIN, TIBC, IRON, RETICCTPCT in the last 72 hours.  Urine analysis:    Component Value Date/Time   COLORURINE AMBER (A) 12/27/2021 1840   APPEARANCEUR TURBID (A) 12/27/2021 1840   LABSPEC 1.013 12/27/2021 1840   PHURINE 8.0 12/27/2021 1840   GLUCOSEU NEGATIVE 12/27/2021 1840   GLUCOSEU NEG mg/dL 93/98/7988 8055   HGBUR MODERATE (A) 12/27/2021 1840   BILIRUBINUR NEGATIVE 12/27/2021 1840   BILIRUBINUR negative 05/04/2018 0910   KETONESUR NEGATIVE 12/27/2021 1840   PROTEINUR >=300 (A) 12/27/2021 1840   UROBILINOGEN 0.2 05/04/2018 0910   UROBILINOGEN 1 02/14/2014 0946   NITRITE NEGATIVE 12/27/2021 1840   LEUKOCYTESUR LARGE (A) 12/27/2021 1840    Sepsis Labs: Lactic Acid, Venous     Component Value Date/Time   LATICACIDVEN 1.1 06/02/2023 0042    MICROBIOLOGY: Recent Results (from the past 240 hours)  Resp panel by RT-PCR (RSV, Flu A&B, Covid) Anterior Nasal Swab     Status: None   Collection Time: 06/02/23 12:34 AM   Specimen: Anterior Nasal Swab  Result Value Ref Range Status   SARS Coronavirus 2 by RT PCR NEGATIVE NEGATIVE Final   Influenza A by PCR NEGATIVE NEGATIVE Final   Influenza B by PCR NEGATIVE NEGATIVE Final    Comment: (NOTE) The Xpert Xpress SARS-CoV-2/FLU/RSV plus assay is intended as an aid in the diagnosis of influenza from Nasopharyngeal swab specimens and should not be used as a sole basis for treatment. Nasal washings and aspirates are unacceptable for Xpert Xpress SARS-CoV-2/FLU/RSV testing.  Fact Sheet for Patients: bloggercourse.com  Fact Sheet for Healthcare Providers: seriousbroker.it  This test is not yet approved or cleared by the [place]  FDA and has been authorized for detection and/or diagnosis of SARS-CoV-2 by FDA under an Emergency Use Authorization (EUA). This EUA will remain in effect (meaning this test can be used) for the duration of the COVID-19 declaration under Section 564(b)(1) of the Act, 21 U.S.C. section 360bbb-3(b)(1), unless the authorization is terminated or revoked.     Resp Syncytial Virus by PCR NEGATIVE NEGATIVE Final    Comment: (NOTE) Fact Sheet for Patients: bloggercourse.com  Fact Sheet for Healthcare Providers: seriousbroker.it  This test is not yet approved or cleared by the [place]  FDA and has been authorized for detection and/or diagnosis of SARS-CoV-2 by FDA under an Emergency Use Authorization (EUA). This EUA will remain in effect (meaning this test can be used) for the duration of the COVID-19 declaration under Section 564(b)(1) of the Act, 21 U.S.C. section 360bbb-3(b)(1),  unless the authorization is terminated or revoked.  Performed at [facility], [address] [place], [place] [address]   Blood Culture (routine x 2)     Status: None (Preliminary result)  Collection Time: 06/02/23 12:34 AM   Specimen: BLOOD  Result Value Ref Range Status   Specimen Description BLOOD SITE NOT SPECIFIED  Final   Special Requests   Final    BOTTLES DRAWN AEROBIC AND ANAEROBIC Blood Culture results may not be optimal due to an inadequate volume of blood received in culture bottles   Culture   Final    NO GROWTH 1 DAY Performed at [facility], [address], [place], [place] [address]    Report Status PENDING  Incomplete  Blood Culture (routine x 2)     Status: None (Preliminary result)   Collection Time: 06/02/23 12:39 AM   Specimen: BLOOD  Result Value Ref Range Status   Specimen Description BLOOD SITE NOT SPECIFIED  Final   Special Requests   Final    BOTTLES DRAWN AEROBIC AND ANAEROBIC Blood Culture adequate volume   Culture   Final    NO GROWTH 1 DAY Performed at [facility], [address] [place], [place] [address]    Report Status PENDING  Incomplete    RADIOLOGY STUDIES/RESULTS: DG Chest Port 1 View Result Date: 06/02/2023 CLINICAL DATA:  Possible sepsis, initial encounter EXAM: PORTABLE CHEST 1 VIEW COMPARISON:  07/28/2022 FINDINGS: Cardiac shadow is enlarged but stable. Aortic calcifications and postoperative changes are again seen. Dialysis catheter is again noted on right stable from the prior exam. Lungs are well aerated bilaterally. New right basilar infiltrate is seen. IMPRESSION: New right basilar infiltrate. Electronically Signed   By: Oneil Devonshire M.D.   On: 06/02/2023 01:27   CT HEAD WO CONTRAST ( ) Result Date: 06/02/2023 CLINICAL DATA:  Altered mental status EXAM: CT HEAD WITHOUT CONTRAST TECHNIQUE: Contiguous axial images were obtained from the base of the skull through the vertex without intravenous contrast. RADIATION  DOSE REDUCTION: This exam was performed according to the departmental dose-optimization program which includes automated exposure control, adjustment of the mA and/or kV according to patient size and/or use of iterative reconstruction technique. COMPARISON:  02/23/2022 FINDINGS: Brain: No evidence of acute infarction, hemorrhage, hydrocephalus, extra-axial collection or mass lesion/mass effect. Mild atrophic changes and chronic white matter ischemic changes are seen. Findings consistent with prior left parietal infarct are noted. Vascular: No hyperdense vessel or unexpected calcification. Skull: Normal. Negative for fracture or focal lesion. Sinuses/Orbits: No acute finding. Other: None. IMPRESSION: Chronic atrophic and ischemic changes. No acute abnormality is noted. Electronically Signed   By: Oneil Devonshire M.D.   On: 06/02/2023 01:25     LOS: 1 day   Donalda Applebaum, MD  [facility]    To contact the attending provider between 7A-7P or the covering provider during after hours 7P-7A, please log into the web site [email or web address] and access using universal [place] password for that web site. If you do not have the password, please call the hospital operator.  06/03/2023, 9:17 AM

## 2023-06-03 NOTE — Plan of Care (Signed)

## 2023-06-04 DIAGNOSIS — J9601 Acute respiratory failure with hypoxia: Secondary | ICD-10-CM | POA: Diagnosis not present

## 2023-06-04 DIAGNOSIS — E875 Hyperkalemia: Secondary | ICD-10-CM | POA: Diagnosis not present

## 2023-06-04 DIAGNOSIS — J189 Pneumonia, unspecified organism: Secondary | ICD-10-CM | POA: Diagnosis not present

## 2023-06-04 DIAGNOSIS — N186 End stage renal disease: Secondary | ICD-10-CM | POA: Diagnosis not present

## 2023-06-04 LAB — CBC
HCT: 34.9 % — ABNORMAL LOW (ref 36.0–46.0)
Hemoglobin: 11.2 g/dL — ABNORMAL LOW (ref 12.0–15.0)
MCH: 29.4 pg (ref 26.0–34.0)
MCHC: 32.1 g/dL (ref 30.0–36.0)
MCV: 91.6 fL (ref 80.0–100.0)
Platelets: 195 10*3/uL (ref 150–400)
RBC: 3.81 MIL/uL — ABNORMAL LOW (ref 3.87–5.11)
RDW: 17.6 % — ABNORMAL HIGH (ref 11.5–15.5)
WBC: 4.3 10*3/uL (ref 4.0–10.5)
nRBC: 0 % (ref 0.0–0.2)

## 2023-06-04 LAB — BLOOD CULTURE ID PANEL (REFLEXED) - BCID2

## 2023-06-04 LAB — RENAL FUNCTION PANEL
Albumin: 2.8 g/dL — ABNORMAL LOW (ref 3.5–5.0)
Anion gap: 18 — ABNORMAL HIGH (ref 5–15)
BUN: 51 mg/dL — ABNORMAL HIGH (ref 8–23)
CO2: 21 mmol/L — ABNORMAL LOW (ref 22–32)
Calcium: 8.6 mg/dL — ABNORMAL LOW (ref 8.9–10.3)
Chloride: 97 mmol/L — ABNORMAL LOW (ref 98–111)
Creatinine, Ser: 8.83 mg/dL — ABNORMAL HIGH (ref 0.44–1.00)
GFR, Estimated: 4 mL/min — ABNORMAL LOW (ref >60)
Glucose, Bld: 108 mg/dL — ABNORMAL HIGH (ref 70–99)
Phosphorus: 8.1 mg/dL — ABNORMAL HIGH (ref 2.5–4.6)
Potassium: 4.9 mmol/L (ref 3.5–5.1)
Sodium: 136 mmol/L (ref 135–145)

## 2023-06-04 LAB — GLUCOSE, CAPILLARY: Glucose-Capillary: 111 mg/dL — ABNORMAL HIGH (ref 70–99)

## 2023-06-04 NOTE — Progress Notes (Signed)
 [facility] Progress Note   Subjective:  Seen in room. Feeling better this am. Denies cp, sob.   Objective Vitals:   06/04/23 0457 06/04/23 0524 06/04/23 0842 06/04/23 0854  BP: (!) 159/88 (!) 159/88 (!) 167/88 (!) 174/95  Pulse: 68   69  Resp: 12   17  Temp: 98.2 F (36.8 C)   98.2 F (36.8 C)  TempSrc: Oral   Oral  SpO2: 96%   95%   Physical Exam General: Alert female in NAD Heart: irregularly irregular rhythm, no murmur Lungs: CTA bilaterally, respirations unlabored Abdomen: Soft, non-distended, +BS Extremities: No edema b/l lower extremities Dialysis Access:  R internal jugular [facility]  Additional Objective Labs: Basic Metabolic Panel: Recent Labs  Lab 06/02/23 1132 06/02/23 1916 06/03/23 0816 06/04/23 0549  NA 135  --  135 136  K 6.2*  --  3.6 4.9  CL 95*  --  97* 97*  CO2 23  --  25 21*  GLUCOSE 68*  --  126* 108*  BUN 51*  --  29* 51*  CREATININE 8.94*  --  6.34* 8.83*  CALCIUM  8.9  --  8.5* 8.6*  PHOS 6.5* 7.2*  --  8.1*   Liver Function Tests: Recent Labs  Lab 06/02/23 0034 06/02/23 1132 06/03/23 0816 06/04/23 0549  AST 18  --  46*  --   ALT 10  --  16  --   ALKPHOS 72  --  50  --   BILITOT 0.9  --  0.7  --   PROT 8.0  --  5.9*  --   ALBUMIN  4.3 3.4* 2.8* 2.8*   No results for input(s): LIPASE, AMYLASE in the last 168 hours. CBC: Recent Labs  Lab 05/28/23 2056 06/02/23 0034 06/03/23 0200 06/03/23 0816 06/04/23 0553  WBC 3.7* 6.4 4.8 3.7* 4.3  NEUTROABS 2.3 5.2 3.0  --   --   HGB 12.3 14.1 11.4* 12.3 11.2*  HCT 39.5 45.5 35.7* 38.7 34.9*  MCV 97.1 96.0 93.5 92.4 91.6  PLT 188 245 195 165 195   Blood Culture    Component Value Date/Time   SDES BLOOD SITE NOT SPECIFIED 06/02/2023 0039   SPECREQUEST  06/02/2023 0039    BOTTLES DRAWN AEROBIC AND ANAEROBIC Blood Culture adequate volume   CULT  06/02/2023 0039    NO GROWTH 2 DAYS Performed at [facility], [address], [place], [place] [address]     REPTSTATUS PENDING 06/02/2023 9960    Cardiac Enzymes: No results for input(s): CKTOTAL, CKMB, CKMBINDEX, TROPONINI in the last 168 hours. CBG: No results for input(s): GLUCAP in the last 168 hours. Iron Studies: No results for input(s): IRON, TIBC, TRANSFERRIN, FERRITIN in the last 72 hours. @lablastinr3 @ Studies/Results: ECHOCARDIOGRAM COMPLETE Result Date: 06/03/2023    ECHOCARDIOGRAM REPORT   Patient Name:   Monique Henderson Date of Exam: 06/03/2023 Medical Rec #:  [id number]          Height:       63.0 in Accession #:    [id number]         Weight:       106.0 lb Date of Birth:  [birth year]           BSA:          1.476 m Patient Age:    72 years           BP:           131/83 mmHg Patient Gender: F  HR:           73 bpm. Exam Location:  Inpatient Procedure: 2D Echo, Color Doppler, Cardiac Doppler and Strain Analysis Indications:    acute systolic chf  History:        Patient has prior history of Echocardiogram examinations, most                 recent 06/19/2022. COPD and end stage renal disease. Hepatitis                 C.; Risk Factors:Hypertension and Dyslipidemia.  Sonographer:    Tinnie Barefoot RDCS Referring Phys: [id number] DARRYLE NED O'NEAL  Sonographer Comments: Global longitudinal strain was attempted. IMPRESSIONS  1. Left ventricular ejection fraction, by estimation, is 45%. The left ventricle has mildly decreased function. The left ventricle demonstrates regional wall motion abnormalities with apical akinesis. There is severe concentric left ventricular hypertrophy. Consider cardiac amyloidosis. Left ventricular diastolic parameters are consistent with Grade II diastolic dysfunction (pseudonormalization). The average left ventricular global longitudinal strain is -3.8 %. The global longitudinal strain is abnormal.  2. Right ventricular systolic function is moderately reduced. The right ventricular size is normal. There is mildly elevated pulmonary artery  systolic pressure. The estimated right ventricular systolic pressure is 41.9 mmHg.  3. Left atrial size was moderately dilated.  4. The mitral valve is normal in structure. Trivial mitral valve regurgitation. No evidence of mitral stenosis.  5. Tricuspid valve regurgitation is mild to moderate.  6. The aortic valve is tricuspid. There is mild calcification of the aortic valve. Aortic valve regurgitation is not visualized. No aortic stenosis is present.  7. Aortic dilatation noted.  8. The inferior vena cava is normal in size with greater than 50% respiratory variability, suggesting right atrial pressure of 3 mmHg. FINDINGS  Left Ventricle: Left ventricular ejection fraction, by estimation, is 45%. The left ventricle has mildly decreased function. The left ventricle demonstrates regional wall motion abnormalities. The average left ventricular global longitudinal strain is -3.8 %. The global longitudinal strain is abnormal. The left ventricular internal cavity size was normal in size. There is severe concentric left ventricular hypertrophy. Left ventricular diastolic parameters are consistent with Grade II diastolic dysfunction (pseudonormalization). Right Ventricle: The right ventricular size is normal. No increase in right ventricular wall thickness. Right ventricular systolic function is moderately reduced. There is mildly elevated pulmonary artery systolic pressure. The tricuspid regurgitant velocity is 3.12 m/s, and with an assumed right atrial pressure of 3 mmHg, the estimated right ventricular systolic pressure is 41.9 mmHg. Left Atrium: Left atrial size was moderately dilated. Right Atrium: Right atrial size was normal in size. Pericardium: There is no evidence of pericardial effusion. Mitral Valve: The mitral valve is normal in structure. Trivial mitral valve regurgitation. No evidence of mitral valve stenosis. Tricuspid Valve: The tricuspid valve is normal in structure. Tricuspid valve regurgitation is mild  to moderate. Aortic Valve: The aortic valve is tricuspid. There is mild calcification of the aortic valve. Aortic valve regurgitation is not visualized. No aortic stenosis is present. Pulmonic Valve: The pulmonic valve was normal in structure. Pulmonic valve regurgitation is not visualized. Aorta: Aortic dilatation noted. Venous: The inferior vena cava is normal in size with greater than 50% respiratory variability, suggesting right atrial pressure of 3 mmHg. IAS/Shunts: No atrial level shunt detected by color flow Doppler.  LEFT VENTRICLE PLAX 2D LVIDd:         4.20 cm   Diastology LVIDs:  3.30 cm   LV e' medial:    5.22 cm/s LV PW:         1.50 cm   LV E/e' medial:  21.8 LV IVS:        1.30 cm   LV e' lateral:   3.70 cm/s LVOT diam:     2.10 cm   LV E/e' lateral: 30.8 LV SV:         50 LV SV Index:   34        2D Longitudinal Strain LVOT Area:     3.46 cm  2D Strain GLS Avg:     -3.8 %  RIGHT VENTRICLE            IVC RV Basal diam:  2.90 cm    IVC diam: 1.50 cm RV S prime:     6.20 cm/s TAPSE (M-mode): 1.1 cm LEFT ATRIUM           Index        RIGHT ATRIUM           Index LA diam:      4.00 cm 2.71 cm/m   RA Area:     14.60 cm LA Vol (A4C): 59.4 ml 40.23 ml/m  RA Volume:   30.50 ml  20.66 ml/m  AORTIC VALVE LVOT Vmax:   85.20 cm/s LVOT Vmean:  53.400 cm/s LVOT VTI:    0.145 m  AORTA Ao Root diam: 2.70 cm Ao Asc diam:  2.90 cm MITRAL VALVE                TRICUSPID VALVE MV Area (PHT): 3.99 cm     TR Peak grad:   38.9 mmHg MV Decel Time: 190 msec     TR Vmax:        312.00 cm/s MV E velocity: 114.00 cm/s MV A velocity: 106.00 cm/s  SHUNTS MV E/A ratio:  1.08         Systemic VTI:  0.14 m                             Systemic Diam: 2.10 cm Dalton McleanMD Electronically signed by Ezra Kanner Signature Date/Time: 06/03/2023/6:04:44 PM    Final    Medications:  ampicillin -sulbactam (UNASYN ) IV 1.5 g (06/03/23 2339)    amLODipine   10 mg Oral Daily   apixaban   2.5 mg Oral BID   carvedilol   25 mg Oral  BID WC   cholecalciferol    Oral Daily   cloNIDine   0.1 mg Oral BID   hydrALAZINE   100 mg Oral Q8H   isosorbide  mononitrate  30 mg Oral Q T,Th,Sat-1800   And   isosorbide  mononitrate  60 mg Oral Q M,W,F,Su-1800   pantoprazole   40 mg Oral BID   rosuvastatin   10 mg Oral Daily   sevelamer  carbonate  2.4 g Oral TID WC    Dialysis Orders: Outpatient HD prescription: THS, 3.5 hours, 2K, 2 calcium , no heparin , TDC, EDW 46 kg, BFR 400, DFR A1.5.  Hectorol  9 mcg every treatment.  Sensipar  60 mg every treatment.  Last Mircera 12/10.   Assessment/Plan: 1. Acute hypoxic respiratory failure: concern for aspiration pneumonia, on abx per primary team. Improved after dialysis. Now on RA.  2. ESRD: Azotemia/hyperkalemia on admission due to missed HD. Received HD x2. K 4.9. Next HD Sat  3. HTN/volume: Volume improving.  continue home meds and UF with HD as tolerated. About 2kg over her  EDW at present 4. Anemia: Hgb above goal, no ESA indicated 5. Secondary hyperparathyroidism:  Calcium  controlled, phos elevated. Resume renvela  6. Nutrition:  Renal diet with fluid restrictions 7. PAFlutter - on carvedilol , eliquis   8. CM - EF 45-50%   Maisie Ronnald Acosta PA-C [facility] 06/04/2023,10:09 AM

## 2023-06-04 NOTE — Evaluation (Signed)
 Physical Therapy Brief Evaluation and Discharge Note Patient Details Name: Monique Henderson MRN: [id number] DOB: [birth year] Today's Date: 06/04/2023   History of Present Illness  Patient is a 73 y.o. female who presented with confusion and respiratory distress-she was found to have hyperkalemia and RLL PNA. Past history of ESRD on HD TTS, A-fib, CVA with residual dysarthria/right hemiparesis, HTN, HLD, COPD.  Clinical Impression  Pt presents with admitting diagnosis above. Pt today was able to ambulate in hallway with no AD and navigate stairs independently. PTA pt reports that she was fully independent. Pt presents at or near baseline mobility. Pt has no further acute PT needs and will be signing off. Re consult PT if mobility status changes. Pt would benefit from continued mobility with mobility specialist during acute stay.        PT Assessment Patient does not need any further PT services  Assistance Needed at Discharge  PRN    Equipment Recommendations None recommended by PT  Recommendations for Other Services       Precautions/Restrictions Precautions Precautions: Fall Restrictions Weight Bearing Restrictions Per Provider Order: No        Mobility  Bed Mobility       General bed mobility comments: Pt received in chair  Transfers Overall transfer level: Independent Equipment used: None                    Ambulation/Gait Ambulation/Gait assistance: Independent Gait Distance (Feet): 200 Feet Assistive device: None Gait Pattern/deviations: WFL(Within Functional Limits) Gait Speed: Pace WFL General Gait Details: no LOB noted. Pt ambulates with good speed.  Home Activity Instructions    Stairs Stairs: Yes Stairs assistance: Independent Stair Management: No rails, Alternating pattern, Forwards Number of Stairs: 10 General stair comments: no LOB noted.  Modified Rankin (Stroke Patients Only)        Balance Overall balance assessment: No apparent  balance deficits (not formally assessed)                        Pertinent Vitals/Pain PT - Brief Vital Signs All Vital Signs Stable: Yes Pain Assessment Pain Assessment: No/denies pain     Home Living Family/patient expects to be discharged to:: Private residence Living Arrangements: Non-relatives/Friends Available Help at Discharge: Family;Available 24 hours/day Home Environment: Stairs to enter  [employer] of Steps: flight Home Equipment: Agricultural Consultant (2 wheels);Cane - single point;BSC/3in1   Additional Comments: lift chair broke.    Prior Function Level of Independence: Independent      UE/LE Assessment   UE ROM/Strength/Tone/Coordination: WFL    LE ROM/Strength/Tone/Coordination: [facility]      Communication   Communication Communication: No apparent difficulties Cueing Techniques: Verbal cues;Tactile cues     Cognition Overall Cognitive Status: Appears within functional limits for tasks assessed/performed       General Comments General comments (skin integrity, edema, etc.): VSS    Exercises     Assessment/Plan    PT Problem List         PT Visit Diagnosis Other abnormalities of gait and mobility (R26.89)    No Skilled PT Patient at baseline level of functioning;Patient is independent with all acitivity/mobility   Co-evaluation                AMPAC 6 Clicks Help needed turning from your back to your side while in a flat bed without using bedrails?: None Help needed moving from lying on your back to sitting on the side of  a flat bed without using bedrails?: None Help needed moving to and from a bed to a chair (including a wheelchair)?: None Help needed standing up from a chair using your arms (e.g., wheelchair or bedside chair)?: None Help needed to walk in hospital room?: None Help needed climbing 3-5 steps with a railing? : None 6 Click Score: 24      End of Session Equipment Utilized During Treatment: Gait belt Activity  Tolerance: Patient tolerated treatment well Patient left: Other (comment) (Handoff to OT in hallway) Nurse Communication: Mobility status PT Visit Diagnosis: Other abnormalities of gait and mobility (R26.89)     Time: 8554-8541 PT Time Calculation (min) (ACUTE ONLY): 13 min  Charges:   PT Evaluation $PT Eval Low Complexity: 1 Low      Sueellen NOVAK, PT, DPT Acute Rehab Services [phone]   Manasseh Pittsley  06/04/2023, 3:54 PM

## 2023-06-04 NOTE — Progress Notes (Addendum)
 PROGRESS NOTE        PATIENT DETAILS Name: Monique Henderson Age: 73 y.o. Sex: female Date of Birth: 10/07/1950 Admit Date: 06/02/2023 Admitting Physician Velna JONELLE Skeeter, MD ERE:Jcalzmz, Aliene, MD  Brief Summary: Patient is a 73 y.o.  female with history of ESRD on HD TTS, A-fib, CVA with residual dysarthria/right hemiparesis, HTN, HLD, COPD who presented with confusion and respiratory distress-she was found to have hyperkalemia and RLL PNA.  Significant events: 1/1>> admit to [facility]  Significant studies: 1/1>> CT head: Chronic atrophy/ischemic changes. 1/1>> CXR: New right basilar infiltrate.  Significant microbiology data: 1/1>> COVID/influenza/RSV PCR: Negative 1/1>> blood culture: No growth 1/2>> echo: EF 45%  Procedures: None  Consults: Nephrology Cardiology  Subjective: Feels overall better-no complaints-has not yet ambulated-awaiting PT/OT eval today.  Objective: Vitals: Blood pressure 133/64, pulse 68, temperature 98.4 F (36.9 C), temperature source Oral, resp. rate (!) 21, SpO2 96%.   Exam: Gen Exam:Alert awake-not in any distress-mild dysarthria (at baseline) HEENT:atraumatic, normocephalic Chest: B/L clear to auscultation anteriorly CVS:S1S2 regular Abdomen:soft non tender, non distended Extremities:no edema Neurology: Mild right-sided hemiparesis. Skin: no rash  Pertinent Labs/Radiology:    Latest Ref Rng & Units 06/04/2023    5:53 AM 06/03/2023    8:16 AM 06/03/2023    2:00 AM  CBC  WBC 4.0 - 10.5 K/uL 4.3  3.7  4.8   Hemoglobin 12.0 - 15.0 g/dL 88.7  87.6  88.5   Hematocrit 36.0 - 46.0 % 34.9  38.7  35.7   Platelets 150 - 400 K/uL 195  165  195     Lab Results  Component Value Date   NA 136 06/04/2023   K 4.9 06/04/2023   CL 97 (L) 06/04/2023   CO2 21 (L) 06/04/2023      Assessment/Plan: Acute hypoxemic respiratory failure secondary to right lobar pneumonia Improved-titrated to room air  Cultures negative to  date On Unasyn  Appreciate SLP eval-regular diet   Hyperkalemia Due to missed HD on 12/28 Treated with HD K levels have normalized.  ESRD on HD-TTS Nephrology following  Hypertensive emergency Secondary to missed HD BP better  Elevated troponin levels Known history of CAD Elevated troponin levels likely reflecting of demand ischemia Cardiology recommending medical management.  Chronic HFrEF Euvolemic Volume removal with HD On beta-blocker/hydralazine /Imdur .  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   Weight as of 05/29/23: 48.1 kg.   Code status:   Code Status: Full Code   DVT Prophylaxis: SCDs Start: 06/02/23 1751 apixaban  (ELIQUIS ) tablet 2.5 mg Start: 06/02/23 1100 apixaban  (ELIQUIS ) tablet 2.5 mg    Family Communication: Sister-Shirley [phone]-updated 1/3   Disposition Plan: Status is: Inpatient Remains inpatient appropriate because: Severity of illness   Planned Discharge Destination:Home health   Diet: Diet Order             Diet renal with fluid restriction Fluid restriction: 1800 mL Fluid; Room service appropriate? Yes; Fluid consistency: Thin  Diet effective now  Antimicrobial agents: Anti-infectives (From admission, onward)    Start     Dose/Rate Route Frequency Ordered Stop   06/03/23 2000  ceFEPIme  (MAXIPIME ) 1 g in sodium chloride  0.9 % 100 mL IVPB  Status:  Discontinued        1 g 200 mL/hr over 30 Minutes Intravenous Daily 06/02/23 1032 06/03/23 0922   06/03/23 1200  vancomycin  (VANCOREADY) IVPB 500  mg/100 mL  Status:  Discontinued        500 mg 100 mL/hr over 60 Minutes Intravenous Every T-Th-Sa (Hemodialysis) 06/02/23 1032 06/03/23 0922   06/03/23 1115  ampicillin -sulbactam (UNASYN ) 1.5 g in sodium chloride  0.9 % 100 mL IVPB        1.5 g 200 mL/hr over 30 Minutes Intravenous Every 12 hours 06/03/23 1018 06/08/23 1114   06/02/23 0045  ceFEPIme  (MAXIPIME ) 2 g in sodium chloride  0.9 % 100 mL IVPB        2 g 200 mL/hr over 30 Minutes Intravenous  Once 06/02/23 0034 06/02/23 0159   06/02/23 0045  metroNIDAZOLE  (FLAGYL ) IVPB 500 mg        500 mg 100 mL/hr over 60 Minutes Intravenous  Once 06/02/23 0034 06/02/23 0259   06/02/23 0045  vancomycin  (VANCOCIN ) IVPB 1000 mg/200 mL premix        1,000 mg 200 mL/hr over 60 Minutes Intravenous  Once 06/02/23 0034 06/02/23 1254        MEDICATIONS: Scheduled Meds:  amLODipine   10 mg Oral Daily   apixaban   2.5 mg Oral BID   carvedilol   25 mg Oral BID WC   cholecalciferol    Oral Daily   cloNIDine   0.1 mg Oral BID   hydrALAZINE   100 mg Oral Q8H   isosorbide  mononitrate  30 mg Oral Q T,Th,Sat-1800   And   isosorbide  mononitrate  60 mg Oral Q M,W,F,Su-1800   pantoprazole   40 mg Oral BID   rosuvastatin   10 mg Oral Daily   sevelamer  carbonate  2.4 g Oral TID WC   Continuous Infusions:  ampicillin -sulbactam (UNASYN ) IV 1.5 g (06/04/23 1139)   PRN Meds:.acetaminophen  **OR** acetaminophen , metoprolol  tartrate, ondansetron  **OR** ondansetron  (ZOFRAN ) IV, senna-docusate   I have personally reviewed following labs and imaging studies  LABORATORY DATA: CBC: Recent Labs  Lab 05/28/23 2056 06/02/23 0034 06/03/23 0200 06/03/23 0816 06/04/23 0553  WBC 3.7* 6.4 4.8 3.7* 4.3  NEUTROABS 2.3 5.2 3.0  --   --   HGB 12.3 14.1 11.4* 12.3 11.2*  HCT 39.5 45.5 35.7* 38.7 34.9*  MCV 97.1 96.0 93.5 92.4 91.6  PLT 188 245 195 165 195    Basic Metabolic Panel: Recent Labs  Lab 05/28/23 2056 06/02/23 0034 06/02/23 1132 06/02/23 1916  06/03/23 0816 06/04/23 0549  NA 141 136 135  --  135 136  K 5.2* >7.5* 6.2*  --  3.6 4.9  CL 100 96* 95*  --  97* 97*  CO2 23 18* 23  --  25 21*  GLUCOSE 91 99 68*  --  126* 108*  BUN 48* 124* 51*  --  29* 51*  CREATININE 8.86* 16.81* 8.94*  --  6.34* 8.83*  CALCIUM  8.5* 9.6 8.9  --  8.5* 8.6*  MG  --   --   --  2.1  --   --   PHOS  --   --  6.5* 7.2*  --  8.1*    GFR: Estimated Creatinine Clearance: 4.5 mL/min (A) (by C-G formula based on SCr of 8.83 mg/dL (  H)).  Liver Function Tests: Recent Labs  Lab 06/02/23 0034 06/02/23 1132 06/03/23 0816 06/04/23 0549  AST 18  --  46*  --   ALT 10  --  16  --   ALKPHOS 72  --  50  --   BILITOT 0.9  --  0.7  --   PROT 8.0  --  5.9*  --   ALBUMIN  4.3 3.4* 2.8* 2.8*   No results for input(s): LIPASE, AMYLASE in the last 168 hours. No results for input(s): AMMONIA in the last 168 hours.  Coagulation Profile: Recent Labs  Lab 06/02/23 0034  INR 1.1    Cardiac Enzymes: No results for input(s): CKTOTAL, CKMB, CKMBINDEX, TROPONINI in the last 168 hours.  BNP (last 3 results) No results for input(s): PROBNP in the last 8760 hours.  Lipid Profile: No results for input(s): CHOL, HDL, LDLCALC, TRIG, CHOLHDL, LDLDIRECT in the last 72 hours.  Thyroid  Function Tests: No results for input(s): TSH, T4TOTAL, FREET4, T3FREE, THYROIDAB in the last 72 hours.  Anemia Panel: No results for input(s): VITAMINB12, FOLATE, FERRITIN, TIBC, IRON, RETICCTPCT in the last 72 hours.  Urine analysis:    Component Value Date/Time   COLORURINE AMBER (A) 12/27/2021 1840   APPEARANCEUR TURBID (A) 12/27/2021 1840   LABSPEC 1.013 12/27/2021 1840   PHURINE 8.0 12/27/2021 1840   GLUCOSEU NEGATIVE 12/27/2021 1840   GLUCOSEU NEG mg/dL 93/98/7988 8055   HGBUR MODERATE (A) 12/27/2021 1840   BILIRUBINUR NEGATIVE 12/27/2021 1840   BILIRUBINUR negative 05/04/2018 0910   KETONESUR NEGATIVE 12/27/2021 1840    PROTEINUR >=300 (A) 12/27/2021 1840   UROBILINOGEN 0.2 05/04/2018 0910   UROBILINOGEN 1 02/14/2014 0946   NITRITE NEGATIVE 12/27/2021 1840   LEUKOCYTESUR LARGE (A) 12/27/2021 1840    Sepsis Labs: Lactic Acid, Venous    Component Value Date/Time   LATICACIDVEN 1.1 06/02/2023 0042    MICROBIOLOGY: Recent Results (from the past 240 hours)  Resp panel by RT-PCR (RSV, Flu A&B, Covid) Anterior Nasal Swab     Status: None   Collection Time: 06/02/23 12:34 AM   Specimen: Anterior Nasal Swab  Result Value Ref Range Status   SARS Coronavirus 2 by RT PCR NEGATIVE NEGATIVE Final   Influenza A by PCR NEGATIVE NEGATIVE Final   Influenza B by PCR NEGATIVE NEGATIVE Final    Comment: (NOTE) The Xpert Xpress SARS-CoV-2/FLU/RSV plus assay is intended as an aid in the diagnosis of influenza from Nasopharyngeal swab specimens and should not be used as a sole basis for treatment. Nasal washings and aspirates are unacceptable for Xpert Xpress SARS-CoV-2/FLU/RSV testing.  Fact Sheet for Patients: bloggercourse.com  Fact Sheet for Healthcare Providers: seriousbroker.it  This test is not yet approved or cleared by the [place]  FDA and has been authorized for detection and/or diagnosis of SARS-CoV-2 by FDA under an Emergency Use Authorization (EUA). This EUA will remain in effect (meaning this test can be used) for the duration of the COVID-19 declaration under Section 564(b)(1) of the Act, 21 U.S.C. section 360bbb-3(b)(1), unless the authorization is terminated or revoked.     Resp Syncytial Virus by PCR NEGATIVE NEGATIVE Final    Comment: (NOTE) Fact Sheet for Patients: bloggercourse.com  Fact Sheet for Healthcare Providers: seriousbroker.it  This test is not yet approved or cleared by the [place]  FDA and has been authorized for detection and/or diagnosis of SARS-CoV-2 by FDA  under an Emergency Use Authorization (EUA). This EUA will remain in effect (meaning this test can be used)  for the duration of the COVID-19 declaration under Section 564(b)(1) of the Act, 21 U.S.C. section 360bbb-3(b)(1), unless the authorization is terminated or revoked.  Performed at [facility], [address], [place], [place] [address]   Blood Culture (routine x 2)     Status: None (Preliminary result)   Collection Time: 06/02/23 12:34 AM   Specimen: BLOOD  Result Value Ref Range Status   Specimen Description BLOOD SITE NOT SPECIFIED  Final   Special Requests   Final    BOTTLES DRAWN AEROBIC AND ANAEROBIC Blood Culture results may not be optimal due to an inadequate volume of blood received in culture bottles   Culture   Final    NO GROWTH 2 DAYS Performed at [facility], [address] [place], [place] [address]    Report Status PENDING  Incomplete  Blood Culture (routine x 2)     Status: None (Preliminary result)   Collection Time: 06/02/23 12:39 AM   Specimen: BLOOD  Result Value Ref Range Status   Specimen Description BLOOD SITE NOT SPECIFIED  Final   Special Requests   Final    BOTTLES DRAWN AEROBIC AND ANAEROBIC Blood Culture adequate volume   Culture   Final    NO GROWTH 2 DAYS Performed at [facility], [address], [place], [place] [address]    Report Status PENDING  Incomplete  MRSA Next Gen by PCR, Nasal     Status: Abnormal   Collection Time: 06/03/23  9:08 AM   Specimen: Nasal Mucosa; Nasal Swab  Result Value Ref Range Status   MRSA by PCR Next Gen DETECTED (A) NOT DETECTED Final    Comment: RESULT CALLED TO, READ BACK BY AND VERIFIED WITH: RN PEGGYE HERO ON [id number] @1252H  BY SM (NOTE) The GeneXpert MRSA Assay (FDA approved for NASAL specimens only), is one component of a comprehensive MRSA colonization surveillance program. It is not intended to diagnose MRSA infection nor to guide or monitor treatment for MRSA infections. Test  performance is not FDA approved in patients less than 2 years old. Performed at [facility], [address], [place], [place] [address]     RADIOLOGY STUDIES/RESULTS: ECHOCARDIOGRAM COMPLETE Result Date: 06/03/2023    ECHOCARDIOGRAM REPORT   Patient Name:   LATINA FRANK Date of Exam: 06/03/2023 Medical Rec #:  [id number]          Height:       63.0 in Accession #:    [id number]         Weight:       106.0 lb Date of Birth:  [birth year]           BSA:          1.476 m Patient Age:    72 years           BP:           131/83 mmHg Patient Gender: F                  HR:           73 bpm. Exam Location:  Inpatient Procedure: 2D Echo, Color Doppler, Cardiac Doppler and Strain Analysis Indications:    acute systolic chf  History:        Patient has prior history of Echocardiogram examinations, most                 recent 06/19/2022. COPD and end stage renal disease.  Hepatitis                 C.; Risk Factors:Hypertension and Dyslipidemia.  Sonographer:    Tinnie Barefoot RDCS Referring Phys: [id number] DARRYLE NED O'NEAL  Sonographer Comments: Global longitudinal strain was attempted. IMPRESSIONS  1. Left ventricular ejection fraction, by estimation, is 45%. The left ventricle has mildly decreased function. The left ventricle demonstrates regional wall motion abnormalities with apical akinesis. There is severe concentric left ventricular hypertrophy. Consider cardiac amyloidosis. Left ventricular diastolic parameters are consistent with Grade II diastolic dysfunction (pseudonormalization). The average left ventricular global longitudinal strain is -3.8 %. The global longitudinal strain is abnormal.  2. Right ventricular systolic function is moderately reduced. The right ventricular size is normal. There is mildly elevated pulmonary artery systolic pressure. The estimated right ventricular systolic pressure is 41.9 mmHg.  3. Left atrial size was moderately dilated.  4. The mitral valve is normal in structure.  Trivial mitral valve regurgitation. No evidence of mitral stenosis.  5. Tricuspid valve regurgitation is mild to moderate.  6. The aortic valve is tricuspid. There is mild calcification of the aortic valve. Aortic valve regurgitation is not visualized. No aortic stenosis is present.  7. Aortic dilatation noted.  8. The inferior vena cava is normal in size with greater than 50% respiratory variability, suggesting right atrial pressure of 3 mmHg. FINDINGS  Left Ventricle: Left ventricular ejection fraction, by estimation, is 45%. The left ventricle has mildly decreased function. The left ventricle demonstrates regional wall motion abnormalities. The average left ventricular global longitudinal strain is -3.8 %. The global longitudinal strain is abnormal. The left ventricular internal cavity size was normal in size. There is severe concentric left ventricular hypertrophy. Left ventricular diastolic parameters are consistent with Grade II diastolic dysfunction (pseudonormalization). Right Ventricle: The right ventricular size is normal. No increase in right ventricular wall thickness. Right ventricular systolic function is moderately reduced. There is mildly elevated pulmonary artery systolic pressure. The tricuspid regurgitant velocity is 3.12 m/s, and with an assumed right atrial pressure of 3 mmHg, the estimated right ventricular systolic pressure is 41.9 mmHg. Left Atrium: Left atrial size was moderately dilated. Right Atrium: Right atrial size was normal in size. Pericardium: There is no evidence of pericardial effusion. Mitral Valve: The mitral valve is normal in structure. Trivial mitral valve regurgitation. No evidence of mitral valve stenosis. Tricuspid Valve: The tricuspid valve is normal in structure. Tricuspid valve regurgitation is mild to moderate. Aortic Valve: The aortic valve is tricuspid. There is mild calcification of the aortic valve. Aortic valve regurgitation is not visualized. No aortic stenosis  is present. Pulmonic Valve: The pulmonic valve was normal in structure. Pulmonic valve regurgitation is not visualized. Aorta: Aortic dilatation noted. Venous: The inferior vena cava is normal in size with greater than 50% respiratory variability, suggesting right atrial pressure of 3 mmHg. IAS/Shunts: No atrial level shunt detected by color flow Doppler.  LEFT VENTRICLE PLAX 2D LVIDd:         4.20 cm   Diastology LVIDs:         3.30 cm   LV e' medial:    5.22 cm/s LV PW:         1.50 cm   LV E/e' medial:  21.8 LV IVS:        1.30 cm   LV e' lateral:   3.70 cm/s LVOT diam:     2.10 cm   LV E/e' lateral: 30.8 LV SV:  50 LV SV Index:   34        2D Longitudinal Strain LVOT Area:     3.46 cm  2D Strain GLS Avg:     -3.8 %  RIGHT VENTRICLE            IVC RV Basal diam:  2.90 cm    IVC diam: 1.50 cm RV S prime:     6.20 cm/s TAPSE (M-mode): 1.1 cm LEFT ATRIUM           Index        RIGHT ATRIUM           Index LA diam:      4.00 cm 2.71 cm/m   RA Area:     14.60 cm LA Vol (A4C): 59.4 ml 40.23 ml/m  RA Volume:   30.50 ml  20.66 ml/m  AORTIC VALVE LVOT Vmax:   85.20 cm/s LVOT Vmean:  53.400 cm/s LVOT VTI:    0.145 m  AORTA Ao Root diam: 2.70 cm Ao Asc diam:  2.90 cm MITRAL VALVE                TRICUSPID VALVE MV Area (PHT): 3.99 cm     TR Peak grad:   38.9 mmHg MV Decel Time: 190 msec     TR Vmax:        312.00 cm/s MV E velocity: 114.00 cm/s MV A velocity: 106.00 cm/s  SHUNTS MV E/A ratio:  1.08         Systemic VTI:  0.14 m                             Systemic Diam: 2.10 cm Dalton McleanMD Electronically signed by Ezra Kanner Signature Date/Time: 06/03/2023/6:04:44 PM    Final      LOS: 2 days   Donalda Applebaum, MD  [facility]    To contact the attending provider between 7A-7P or the covering provider during after hours 7P-7A, please log into the web site [email or web address] and access using universal [place] password for that web site. If you do not have the password, please call the  hospital operator.  06/04/2023, 12:20 PM

## 2023-06-04 NOTE — Evaluation (Signed)
 Occupational Therapy Evaluation and DC Summary  Patient Details Name: Monique Henderson MRN: [id number] DOB: [birth year] Today's Date: 06/04/2023   History of Present Illness Patient is a 73 y.o. female who presented with confusion and respiratory distress-she was found to have hyperkalemia and RLL PNA. Past history of ESRD on HD TTS, A-fib, CVA with residual dysarthria/right hemiparesis, HTN, HLD, COPD.   Clinical Impression   Pt presenting close to baseline, ambulating independently and completing ADLs with setup to mod I level. Pt noted to have impaired FMC of RUE from prior CVA, educated pt on PIP/DIP flexion exercises to get more ROM, she reports having a bunch of exercises with putty and a saebo glove from attending outpt for months. Pt still has hopes of getting back to driving, recommend she attend more outpt OT specialized towards hand therapy to get closer to her functional goals, if not able to then her best bet would be to look into AE for driving rehab. No current acute skilled OT needs at this time.        If plan is discharge home, recommend the following: Assist for transportation    Functional Status Assessment  Patient has not had a recent decline in their functional status  Equipment Recommendations  None recommended by OT    Recommendations for Other Services       Precautions / Restrictions Precautions Precautions: Fall Restrictions Weight Bearing Restrictions Per Provider Order: No      Mobility Bed Mobility               General bed mobility comments: Pt rec'd from PT in hallway    Transfers                          Balance Overall balance assessment: Mild deficits observed, not formally tested (minor balance inconsistencies that may be pt baseline)                                         ADL either performed or assessed with clinical judgement   ADL Overall ADL's : Needs assistance/impaired Eating/Feeding: Set  up;Sitting   Grooming: Standing;Supervision/safety;Wash/dry Programmer, Applications Details (indicate cue type and reason): pt observed in PT session Upper Body Bathing: Standing;Supervision/ safety   Lower Body Bathing: Sitting/lateral leans;Supervison/ safety   Upper Body Dressing : Sitting;Set up   Lower Body Dressing: Sitting/lateral leans;Supervision/safety   Toilet Transfer: Community Education Officer Details (indicate cue type and reason): Pt observed during PT session Toileting- Clothing Manipulation and Hygiene: Independent       Functional mobility during ADLs: Modified independent       Vision         Perception         Praxis         Pertinent Vitals/Pain Pain Assessment Pain Assessment: No/denies pain     Extremity/Trunk Assessment Upper Extremity Assessment Upper Extremity Assessment: Right hand dominant;Generalized weakness;RUE deficits/detail RUE Deficits / Details: ROM WFLl, grasp limited due to impaired finger extension of digits. Flexes MPs 50* but very little PIP AROM RUE Coordination: decreased fine motor   Lower Extremity Assessment Lower Extremity Assessment: Defer to PT evaluation       Communication Communication Communication: No apparent difficulties Cueing Techniques: Verbal cues   Cognition Arousal: Alert Behavior During Therapy: WFL for tasks assessed/performed Overall Cognitive Status: Within Functional Limits for  tasks assessed                                       General Comments  VSS    Exercises Other Exercises Other Exercises: PIP/DIP flexion AAROM RUE   Shoulder Instructions      Home Living Family/patient expects to be discharged to:: Private residence Living Arrangements: Non-relatives/Friends Available Help at Discharge: Family;Available 24 hours/day Type of Home: Apartment Home Access: Stairs to enter [employer] of Steps: pt reports flight of stairs Entrance Stairs-Rails: Can reach  both Home Layout: One level     Bathroom Shower/Tub: Sponge bathes at baseline;Tub/shower unit;Curtain   Bathroom Toilet: Standard Bathroom Accessibility: Yes How Accessible: Accessible via walker Home Equipment: Rolling Walker (2 wheels);Cane - single point;BSC/3in1   Additional Comments: lift chair broke.      Prior Functioning/Environment Prior Level of Function : Independent/Modified Independent;Needs assist             Mobility Comments: ind no AD ADLs Comments: Independent in ADL and IADL. Family drives her to appointments/grocery store and provides pillbox set-up for medication management        OT Problem List: Impaired UE functional use      OT Treatment/Interventions:      OT Goals(Current goals can be found in the care plan section) Acute Rehab OT Goals Patient Stated Goal: To get back to driving OT Goal Formulation: With patient Time For Goal Achievement: 06/18/23 Potential to Achieve Goals: Good  OT Frequency:      Co-evaluation              AM-PAC OT 6 Clicks Daily Activity     Outcome Measure Help from another person eating meals?: A Little Help from another person taking care of personal grooming?: A Little Help from another person toileting, which includes using toliet, bedpan, or urinal?: None Help from another person bathing (including washing, rinsing, drying)?: A Little Help from another person to put on and taking off regular upper body clothing?: A Little Help from another person to put on and taking off regular lower body clothing?: A Little 6 Click Score: 19   End of Session Nurse Communication: Mobility status  Activity Tolerance: Patient tolerated treatment well Patient left: in bed;with call bell/phone within reach;with bed alarm set  OT Visit Diagnosis: Other symptoms and signs involving cognitive function                Time: 8741-8693 OT Time Calculation (min): 8 min Charges:  OT General Charges $OT Visit: 1 Visit OT  Evaluation $OT Eval Low Complexity: 1 Low  06/04/2023  AB, OTR/L  Acute Rehabilitation Services  Office: [phone]   Curtistine JONETTA Das 06/04/2023, 4:23 PM

## 2023-06-04 NOTE — Progress Notes (Signed)
 PHARMACY - PHYSICIAN COMMUNICATION CRITICAL VALUE ALERT - BLOOD CULTURE IDENTIFICATION (BCID)  Monique Henderson is an 73 y.o. female who presented to [facility] on 06/02/2023 with a chief complaint of respiratory distress and confusion.   Assessment:  Contacted by microbiology lab and notified of 1/4 blood cultures positive for Gram positive cocci. Patient is afebrile, without leukocytosis, and feeling better per MD's note today.   Name of physician (or Provider) Contacted: Dr. Evalene Opyd  Current antibiotics: Unasyn  1.5 g q12h   Changes to prescribed antibiotics recommended:  No changes at this time, likely contaminant   No results found. However, due to the size of the patient record, not all encounters were searched. Please check Results Review for a complete set of results.  Rankin Sams 06/04/2023  8:00 PM

## 2023-06-04 NOTE — Progress Notes (Addendum)
 Patient Name: BAO COREAS Date of Encounter: 06/04/2023 [facility] Cardiologist: Candyce Reek, MD   Interval Summary  .    Patient reports feeling well today. Denies chest pain, shortness of breath, palpitations. Maintaining NSR per tele. Patient's sister, Orlean is on the phone. Per Orlean- patient recently had a dialysis catheter inserted on 12/27. Patient was not told to hold her eliquis  before the procedure, and she had bleeding from the catheter site for a few days following the procedure. She was concerned about the bleeding, went to the ED on 12/28 because of it. She ended up missing her dialysis due to the bleeding and ED visits.   Vital Signs .    Vitals:   06/04/23 0457 06/04/23 0524 06/04/23 0842 06/04/23 0854  BP: (!) 159/88 (!) 159/88 (!) 167/88 (!) 174/95  Pulse: 68   69  Resp: 12   17  Temp: 98.2 F (36.8 C)   98.2 F (36.8 C)  TempSrc: Oral   Oral  SpO2: 96%   95%   No intake or output data in the 24 hours ending 06/04/23 0855    06/03/2023    6:58 AM 05/29/2023    7:22 AM 05/28/2023    7:45 PM  Last 3 Weights  Weight (lbs) -- 106 lb 106 lb  Weight (kg) -- 48.081 kg 48.081 kg      Telemetry/ECG    NSR, HR in the 60s-70s - Personally Reviewed  Physical Exam .   GEN: No acute distress.  Laying in the bed with head elevated  Neck: No JVD Cardiac:  RRR, no murmurs, rubs, or gallops. Radial pulses 2+ bilaterally  Respiratory: Breathing unlabored on room air  GI: Soft, nontender, non-distended  MS: No edema in BLE   Assessment & Plan .     Paroxysmal Atrial Flutter  RBBB - Patient had recurrence of atrial flutter on 1/1 after dialysis. Now back in NSR. Has chronic RBBB  - Continue carvedilol  25 mg BID - Continue eliquis  2.5 mg BID - dose adjusted for weight, renal function. Per sister, patient was not instructed to hold eliquis  prior to perma cath insertion on 12/27 and she had bleeding after the procedure. 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 With severe LVH and distal septal hypokinesis, recommended consideration of further workup for amyloid. With her ESRD and poor compliance with HD, she was not a candidate for cMRI at the time.  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 Also has a history of cocaine  use, though has not used recently  - Echocardiogram this admission showed EF 45%, regional wall motion abnormalities with apical akinesis, severe LVH, grade II DD, moderately reduced RV function, mildly elevated PA systolic pressure, mild-moderate TR  - Again, findings concerning for possible cardiac amyloidosis. With ESRD, poor compliance with medications/HD, and with her low functional status after her CVA, may not be a good candidate for aggressive workup. Will discuss further with MD  - Continue carvedilol  25 mg BID, hydralazine  100 mg TID, imdur   -  Volume managed by HD    ESRD Hyperkalemia  - Nephrology managing HD- on TTS schedule    CAD  Elevated Troponin  - Nuclear stress test from 06/2021 showed no reversible ischemia, moderate apical scarring/infarct  - hsTn elevated [id number]  - Suspect trop elevation is due to rapid atrial flutter. She is not having chest pain  - Patient is noncompliant with medications and HD. She also is quite debilitated from her prior stroke. Recommend conservative/medcal management  - EF stable at 45 %  - not on ASA as she is on eliquis  for PAF  - Continue carvedilol  25 mg BID  - Continue crestor  10  mg daily    Ascending aortic dissection s/p endovascular repair  - CT from 12/2022 showed unchanged contour and caliber of the thoracic aorta s/p graft repair of the ascending thoracic aorta - Continue to follow as an outpatient    HTN  - Currently on amlodipine  10 mg daily, carvedilol  25 mg BID, clonidine  0.1 mg BID, hydralazine  100 mg TID, imdur  30 mg on HD days and 60 mg daily on non HD days  - BP remains elevated- up to 174/95 this AM  - Discussed with patient and her sister- prior to admission, patient's BP was very well controlled on current regiment. They would prefer to continue her current medication regiment and dialysis before further adjusting BP medications.    Medication Noncompliance  - Patient reports occasionally forgetting to take her home medications. Discussed the importance of medication compliance. Sister helps organize patient's pill box   For questions or updates, please contact [facility] Please consult [email or web address] for contact info under    Signed, Rollo FABIENE Louder, PA-C   Patient seen and examined, note reviewed with the signed Advanced Practice Provider. I personally reviewed laboratory data, imaging studies and relevant notes. I independently examined the patient and formulated the important aspects of the plan. I have personally discussed the plan with the patient and/or family. Comments or changes to the note/plan are indicated below.  Paroxysmal atrial flutter  RBBB  Chronic systolic heart failure  CAD with elevated trop  Mild mitral regurgitation  Moderate Tricuspid regurgitation   Blood pressure still elevated but reported history of noncompliance - next of kin ( sister) who helps with her medication reports that the patient's blood pressure is at target when she takes he mediations as prescribed. I am hesitant to aggressively adjust the dose to avoid hypotension in the outpatient. She was restarted on her medication dose yesterday. She is  on clonidine  - this could be the rebound hypertension from missing the clonidine  dose. If blood pressure does not improve on her current dose by tomorrow - recommend increasing the clonidine  to 0.2 mg BID.  She is now back in sinus rhythm with occasional PAC. Continue the coreg  25 mg BID and Renal dose Eliquis  2.5 mg BID.  HD per nephro  In terms of her elevated trop it will be best to pursue medical management for now.   Echo performed yesterday -- reviewed by me, no change from prior.  Luree Palla DO, MS [facility] Attending Cardiologist [facility]  [address] [place], [place] [address] [phone] Website: [email or web address]

## 2023-06-04 NOTE — Care Management Important Message (Signed)
 Important Message  Patient Details  Name: Monique Henderson MRN: [id number] Date of Birth: [birth year]   Important Message Given:  Yes - Medicare IM     Dorena Bodo 06/04/2023, 4:01 PM

## 2023-06-05 DIAGNOSIS — E875 Hyperkalemia: Secondary | ICD-10-CM | POA: Diagnosis not present

## 2023-06-05 DIAGNOSIS — N186 End stage renal disease: Secondary | ICD-10-CM

## 2023-06-05 DIAGNOSIS — J9601 Acute respiratory failure with hypoxia: Secondary | ICD-10-CM | POA: Diagnosis not present

## 2023-06-05 DIAGNOSIS — Z7901 Long term (current) use of anticoagulants: Secondary | ICD-10-CM

## 2023-06-05 DIAGNOSIS — J189 Pneumonia, unspecified organism: Secondary | ICD-10-CM | POA: Diagnosis not present

## 2023-06-05 DIAGNOSIS — I4892 Unspecified atrial flutter: Secondary | ICD-10-CM | POA: Diagnosis not present

## 2023-06-05 DIAGNOSIS — Z91148 Patient's other noncompliance with medication regimen for other reason: Secondary | ICD-10-CM

## 2023-06-05 DIAGNOSIS — I5022 Chronic systolic (congestive) heart failure: Secondary | ICD-10-CM

## 2023-06-05 DIAGNOSIS — I071 Rheumatic tricuspid insufficiency: Secondary | ICD-10-CM | POA: Diagnosis not present

## 2023-06-05 DIAGNOSIS — F1411 Cocaine abuse, in remission: Secondary | ICD-10-CM

## 2023-06-05 DIAGNOSIS — R7989 Other specified abnormal findings of blood chemistry: Secondary | ICD-10-CM

## 2023-06-05 DIAGNOSIS — Z8679 Personal history of other diseases of the circulatory system: Secondary | ICD-10-CM

## 2023-06-05 LAB — RENAL FUNCTION PANEL
Albumin: 2.9 g/dL — ABNORMAL LOW (ref 3.5–5.0)
Anion gap: 18 — ABNORMAL HIGH (ref 5–15)
BUN: 65 mg/dL — ABNORMAL HIGH (ref 8–23)
CO2: 24 mmol/L (ref 22–32)
Calcium: 8.8 mg/dL — ABNORMAL LOW (ref 8.9–10.3)
Chloride: 95 mmol/L — ABNORMAL LOW (ref 98–111)
Creatinine, Ser: 10.94 mg/dL — ABNORMAL HIGH (ref 0.44–1.00)
GFR, Estimated: 3 mL/min — ABNORMAL LOW (ref >60)
Glucose, Bld: 103 mg/dL — ABNORMAL HIGH (ref 70–99)
Phosphorus: 8.7 mg/dL — ABNORMAL HIGH (ref 2.5–4.6)
Potassium: 4.8 mmol/L (ref 3.5–5.1)
Sodium: 137 mmol/L (ref 135–145)

## 2023-06-05 LAB — CBC
HCT: 33.4 % — ABNORMAL LOW (ref 36.0–46.0)
Hemoglobin: 10.7 g/dL — ABNORMAL LOW (ref 12.0–15.0)
MCH: 29.9 pg (ref 26.0–34.0)
MCHC: 32 g/dL (ref 30.0–36.0)
MCV: 93.3 fL (ref 80.0–100.0)
Platelets: 224 10*3/uL (ref 150–400)
RBC: 3.58 MIL/uL — ABNORMAL LOW (ref 3.87–5.11)
RDW: 17.2 % — ABNORMAL HIGH (ref 11.5–15.5)
WBC: 5.1 10*3/uL (ref 4.0–10.5)
nRBC: 0 % (ref 0.0–0.2)

## 2023-06-05 MED ORDER — PENTAFLUOROPROP-TETRAFLUOROETH EX AERO: 1.0000 | INHALATION_SPRAY | CUTANEOUS | Status: DC | PRN

## 2023-06-05 MED ORDER — LIDOCAINE HCL (PF) 1 % IJ SOLN: 5.0000 mL | INTRAMUSCULAR | Status: DC | PRN

## 2023-06-05 MED ORDER — ANTICOAGULANT SODIUM CITRATE 4% (200MG/5ML) IV SOLN: 5.0000 mL | Status: DC | PRN

## 2023-06-05 MED ORDER — HEPARIN SODIUM (PORCINE) 1000 UNIT/ML DIALYSIS
1000.0000 [IU] | INTRAMUSCULAR | Status: DC | PRN
Start: 2023-06-05 — End: 2023-06-05

## 2023-06-05 MED ORDER — LIDOCAINE-PRILOCAINE 2.5-2.5 % EX CREA: 1.0000 | TOPICAL_CREAM | CUTANEOUS | Status: DC | PRN

## 2023-06-05 MED ORDER — HEPARIN SODIUM (PORCINE) 1000 UNIT/ML IJ SOLN
3800.0000 [IU] | Freq: Once | INTRAMUSCULAR | Status: AC
  Administered 2023-06-05: 3800 [IU]
  Filled 2023-06-05: qty 4

## 2023-06-05 MED ORDER — ALTEPLASE 2 MG IJ SOLR: 2.0000 mg | Freq: Once | INTRAMUSCULAR | Status: DC | PRN

## 2023-06-05 NOTE — Progress Notes (Signed)
 Patient Name: Monique Henderson Date of Encounter: 06/05/2023 [facility] Cardiologist: Candyce Reek, MD   Interval Summary  .    Resting in bed comfortably. Status post hemodialysis.  Vital Signs .    Vitals:   06/05/23 1119 06/05/23 1131 06/05/23 1211 06/05/23 1600  BP: (!) 182/81 (!) 174/97 (!) 209/105 (!) 148/134  Pulse: 70 68 75 67  Resp: (!) 23 16 (!) 21 18  Temp: 98.8 F (37.1 C)  97.8 F (36.6 C) 98.1 F (36.7 C)  TempSrc: Oral  Oral Oral  SpO2: 100% 97%    Weight:        Intake/Output Summary (Last 24 hours) at 06/05/2023 1751 Last data filed at 06/05/2023 1119 Gross per 24 hour  Intake --  Output 1600 ml  Net -1600 ml      06/05/2023    7:55 AM 06/05/2023    4:08 AM 06/03/2023    6:58 AM  Last 3 Weights  Weight (lbs) 106 lb 14.8 oz 110 lb 0.2 oz --  Weight (kg) 48.5 kg 49.9 kg --      Telemetry/ECG    Sinus rhythm, 1 episode of ventricular run lasting 3 beats over the last 24 hours- Personally Reviewed  Echocardiogram: 06/03/2023  1. Left ventricular ejection fraction, by estimation, is 45%. The left  ventricle has mildly decreased function. The left ventricle demonstrates  regional wall motion abnormalities with apical akinesis. There is severe  concentric left ventricular  hypertrophy. Consider cardiac amyloidosis. Left ventricular diastolic  parameters are consistent with Grade II diastolic dysfunction  (pseudonormalization). The average left ventricular global longitudinal  strain is -3.8 %. The global longitudinal strain  is abnormal.   2. Right ventricular systolic function is moderately reduced. The right  ventricular size is normal. There is mildly elevated pulmonary artery  systolic pressure. The estimated right ventricular systolic pressure is  41.9 mmHg.   3. Left atrial size was moderately dilated.   4. The mitral valve is normal in structure. Trivial mitral valve  regurgitation. No evidence of mitral stenosis.   5. Tricuspid  valve regurgitation is mild to moderate.   6. The aortic valve is tricuspid. There is mild calcification of the  aortic valve. Aortic valve regurgitation is not visualized. No aortic  stenosis is present.   7. Aortic dilatation noted.   8. The inferior vena cava is normal in size with greater than 50%  respiratory variability, suggesting right atrial pressure of 3 mmHg.   Physical Exam .   GEN: No acute distress.  Hemodynamically stable, appears older than stated age Neck: No JVD, hemodialysis catheter over the right anterior chest wall Cardiac:  RRR, no murmurs, rubs, or gallops. Radial pulses 2+ bilaterally  Respiratory: Breathing unlabored on room air  GI: Soft, nontender, non-distended  MS: No edema in BLE   Assessment & Plan .     Paroxysmal Atrial Flutter  RBBB - Patient had recurrence of atrial flutter on 06/02/2023 after dialysis.  -Remains in sinus rhythm. -Telemetry reviewed.   -Continue carvedilol  25 mg p.o. twice daily  -Continue close 2.5 mg p.o. twice daily (age 73, weight 48.5 kg, serum creatinine 10.9) .    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 With severe LVH and distal septal hypokinesis, recommended consideration of further workup for amyloid.  With her ESRD and poor compliance with HD, she was not a candidate for cMRI at the time.  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 Also has a history of cocaine  use, though has not used recently  -Patient is aware that she needs illustrated better compliance with medical therapy prior to being considered for workup/treatment of cardiac amyloidosis.  Her overall function capacity after CVA is reported to be  low. -Workup could be considered as outpatient if she follows up. -Medications reconciled   ESRD Hyperkalemia  - Nephrology managing HD- on TTS schedule    Elevated troponins: Likely demand ischemia in the setting of mildly reduced LVEF, a flutter with RVR, end-stage renal disease. Last ischemic workup January 2023. Continue conservative management as she is asymptomatic and denies anginal discomfort If when she is willing to follow-up outpatient workup can be considered based on symptoms.   Ascending aortic dissection s/p endovascular repair  Hypertension. History of cocaine  use. - CT from 12/2022 showed unchanged contour and caliber of the thoracic aorta s/p graft repair of the ascending thoracic aorta - Currently on amlodipine  10 mg daily, carvedilol  25 mg BID, clonidine  0.1 mg BID, hydralazine  100 mg TID, imdur  30 mg on HD days and 60 mg daily on non HD days  -Based on EMR sister states that if she is compliant with medical therapy her blood pressures are well-controlled.  She needs close observation.  And uptitrate medical therapy if needed.  Will defer to primary and nephrology team. -Patient understands the importance of blood pressure management given her history of ascending aortic dissection with endovascular repair in the past.    Medication Noncompliance  - Patient reports occasionally forgetting to take her home medications. Discussed the importance of medication compliance. Sister helps organize patient's pill box .  Patient remains in sinus rhythm therefore cardiology will sign off.  Please reach out if any questions or concerns arise.  Will arrange outpatient follow-up.  For questions or updates, please contact [facility] Please consult [email or web address] for contact info under     Madonna Large, DO, [facility]  [facility]  [address] [place], [place] [address] Pager: [phone] Office: [phone] 6:01 PM 06/05/23

## 2023-06-05 NOTE — Progress Notes (Signed)
   06/05/23 1119  Vitals  Temp 98.8 F (37.1 C)  Temp Source Oral  BP (!) 182/81  MAP (mmHg) 109  Pulse Rate 70  ECG Heart Rate 72  Resp (!) 23  Oxygen  Therapy  SpO2 100 %  O2 Device Nasal Cannula  O2 Flow Rate (L/min) 2 L/min  During Treatment Monitoring  HD Safety Checks Performed Yes  Intra-Hemodialysis Comments See progress note (Patient terminated session early.  AMA paperwork signedPA Ejigiri informed.)  Dialysis Fluid Bolus Normal Saline  Bolus Amount (mL) 300 mL  Post Treatment  Dialyzer Clearance Lightly streaked  Liters Processed 68.4  Fluid Removed (mL) 1600 mL  Tolerated HD Treatment Yes  Hemodialysis Catheter Right Subclavian Double lumen Permanent (Tunneled)  Placement Date/Time: 05/28/23 0942   Placed prior to admission: No  Serial / Lot #: [id number]  Expiration Date: 04/01/27  Time Out: Correct patient;Correct site;Correct procedure  Maximum sterile barrier precautions: Sterile gloves;Sterile gown;Mask;C...  Site Condition No complications  Blue Lumen Status Flushed;Heparin  locked;Dead end cap in place  Red Lumen Status Flushed;Heparin  locked;Dead end cap in place  Purple Lumen Status N/A  Catheter fill solution Heparin  1000 units/ml  Catheter fill volume (Arterial) 1.9 cc  Catheter fill volume (Venous) 1.9  Dressing Type Transparent  Dressing Status Antimicrobial disc in place;Clean, Dry, Intact  Interventions Other (Comment) (deaccessed)  Drainage Description None  Dressing Change Due 06/12/23  Post treatment catheter status Capped and Clamped

## 2023-06-05 NOTE — Plan of Care (Signed)

## 2023-06-05 NOTE — Procedures (Signed)
 I was present at this dialysis session. I have reviewed the session itself and made appropriate changes.   Vital signs in last 24 hours:  Temp:  [97.8 F (36.6 C)-98.5 F (36.9 C)] 97.8 F (36.6 C) (01/04 0755) Pulse Rate:  [67-71] 69 (01/04 0819) Resp:  [17-27] 27 (01/04 0819) BP: (133-174)/(64-95) 155/85 (01/04 0819) SpO2:  [95 %-98 %] 98 % (01/04 0819) Weight:  [48.5 kg-49.9 kg] 48.5 kg (01/04 0755) Weight change:  Filed Weights   06/05/23 0408 06/05/23 0755  Weight: 49.9 kg 48.5 kg    Recent Labs  Lab 06/04/23 0549  NA 136  K 4.9  CL 97*  CO2 21*  GLUCOSE 108*  BUN 51*  CREATININE 8.83*  CALCIUM  8.6*  PHOS 8.1*    Recent Labs  Lab 06/02/23 0034 06/03/23 0200 06/03/23 0816 06/04/23 0553  WBC 6.4 4.8 3.7* 4.3  NEUTROABS 5.2 3.0  --   --   HGB 14.1 11.4* 12.3 11.2*  HCT 45.5 35.7* 38.7 34.9*  MCV 96.0 93.5 92.4 91.6  PLT 245 195 165 195    Scheduled Meds:  amLODipine   10 mg Oral Daily   apixaban   2.5 mg Oral BID   carvedilol   25 mg Oral BID WC   cholecalciferol    Oral Daily   cloNIDine   0.1 mg Oral BID   hydrALAZINE   100 mg Oral Q8H   isosorbide  mononitrate  30 mg Oral Q T,Th,Sat-1800   And   isosorbide  mononitrate  60 mg Oral Q M,W,F,Su-1800   pantoprazole   40 mg Oral BID   rosuvastatin   10 mg Oral Daily   sevelamer  carbonate  2.4 g Oral TID WC   Continuous Infusions:  ampicillin -sulbactam (UNASYN ) IV Stopped (06/05/23 0030)   anticoagulant sodium citrate      PRN Meds:.acetaminophen  **OR** acetaminophen , alteplase , anticoagulant sodium citrate , heparin , lidocaine  (PF), lidocaine -prilocaine , metoprolol  tartrate, ondansetron  **OR** ondansetron  (ZOFRAN ) IV, pentafluoroprop-tetrafluoroeth, senna-docusate   Fairy DELENA Sellar,  MD 06/05/2023, 8:32 AM

## 2023-06-05 NOTE — Plan of Care (Signed)

## 2023-06-05 NOTE — Progress Notes (Addendum)
 Received patient in bed to unit.  Alert and oriented.  Informed consent signed and in chart.   TX duration:2 hours and 52 minutes.  Patient terminated session early, AMA paperwork signed.  Patient tolerated well.  Transported back to the room  Alert, without acute distress.  Hand-off given to patient's nurse.   Access used: R internal jugular HD CATH Access issues: none  Total UF removed: 1.6L Medication(s) given: Tylenol    Camellia Brasil LPN [facility]

## 2023-06-05 NOTE — Progress Notes (Signed)
 PROGRESS NOTE        PATIENT DETAILS Name: Monique Henderson Age: 73 y.o. Sex: female Date of Birth: September 23, 1950 Admit Date: 06/02/2023 Admitting Physician Velna JONELLE Skeeter, MD ERE:Jcalzmz, Aliene, MD  Brief Summary: Patient is a 73 y.o.  female with history of ESRD on HD TTS, A-fib, CVA with residual dysarthria/right hemiparesis, HTN, HLD, COPD who presented with confusion and respiratory distress-she was found to have hyperkalemia and RLL PNA.  Significant events: 12/27>> HD catheter exchanged by Dr. Melia 01/01>> admit to [facility] for hypoxia/hyperkalemia-missed last HD.  Significant studies: 1/1>> CT head: Chronic atrophy/ischemic changes. 1/1>> CXR: New right basilar infiltrate.  Significant microbiology data: 1/1>> COVID/influenza/RSV PCR: Negative 1/1>> blood culture: 1/2-gram-positive cocci 1/2>> echo: EF 45%  Procedures: None  Consults: Nephrology Cardiology  Subjective: Lying comfortably in bed-no major issues.  Objective: Vitals: Blood pressure (!) 209/105, pulse 75, temperature 97.8 F (36.6 C), temperature source Oral, resp. rate (!) 21, weight 48.5 kg, SpO2 97%.   Exam: Gen Exam: Not in distress-slight dysarthria persists. HEENT:atraumatic, normocephalic Chest: B/L clear to auscultation anteriorly CVS:S1S2 regular Abdomen:soft non tender, non distended Extremities:no edema Neurology: Mild right-sided hemiparesis Skin: no rash  Pertinent Labs/Radiology:    Latest Ref Rng & Units 06/05/2023    8:00 AM 06/04/2023    5:53 AM 06/03/2023    8:16 AM  CBC  WBC 4.0 - 10.5 K/uL 5.1  4.3  3.7   Hemoglobin 12.0 - 15.0 g/dL 89.2  88.7  87.6   Hematocrit 36.0 - 46.0 % 33.4  34.9  38.7   Platelets 150 - 400 K/uL 224  195  165     Lab Results  Component Value Date   NA 137 06/05/2023   K 4.8 06/05/2023   CL 95 (L) 06/05/2023   CO2 24 06/05/2023      Assessment/Plan: Acute hypoxemic respiratory failure secondary to right lobar  pneumonia Improved-titrated to room air  Cultures negative to date On Unasyn  Appreciate SLP eval-regular diet   Hyperkalemia Due to missed HD on 12/28 Treated with HD K levels have normalized.  Gram-positive bacteremia Blood cultures drawn on admission-overnight-positive for gram-positive cocci Suspect this is a contamination-however patient has HD catheter was recently exchanged on 12/27 Plan is to hold plans for discharge today-allow final blood cultures to result before deciding on how to proceed.  ESRD on HD-TTS Nephrology following  Hypertensive emergency Secondary to missed HD BP better  Elevated troponin levels Known history of CAD Elevated troponin levels likely reflecting of demand ischemia Cardiology recommending medical management.  Chronic HFrEF Euvolemic Volume removal with HD On beta-blocker/hydralazine /Imdur .  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   Weight as of this encounter: 48.5 kg.   Code status:   Code Status: Full Code   DVT Prophylaxis: SCDs Start: 06/02/23 1751 apixaban  (ELIQUIS ) tablet 2.5 mg Start: 06/02/23 1100 apixaban  (ELIQUIS ) tablet 2.5 mg    Family Communication: Sister-Shirley Bass-[phone]-updated 1/4   Disposition Plan: Status is: Inpatient Remains inpatient appropriate because: Severity of illness   Planned Discharge Destination:Home  health   Diet: Diet Order             Diet renal with fluid restriction Fluid restriction: 1800 mL Fluid; Room service appropriate? Yes; Fluid consistency: Thin  Diet effective now                      Antimicrobial agents: Anti-infectives (From admission, onward)    Start     Dose/Rate Route Frequency Ordered Stop   06/03/23 2000  ceFEPIme  (MAXIPIME ) 1 g in sodium chloride  0.9 % 100 mL IVPB  Status:  Discontinued        1 g 200 mL/hr over 30 Minutes Intravenous Daily 06/02/23 1032 06/03/23 0922   06/03/23 1200  vancomycin  (VANCOREADY) IVPB 500 mg/100 mL  Status:  Discontinued        500 mg 100 mL/hr over 60 Minutes Intravenous Every T-Th-Sa (Hemodialysis) 06/02/23 1032 06/03/23 0922   06/03/23 1115  ampicillin -sulbactam (UNASYN ) 1.5 g in sodium chloride  0.9 % 100 mL IVPB        1.5 g 200 mL/hr over 30 Minutes Intravenous Every 12 hours 06/03/23 1018 06/08/23 1114   06/02/23 0045  ceFEPIme  (MAXIPIME ) 2 g in sodium chloride  0.9 % 100 mL IVPB        2 g 200 mL/hr over 30 Minutes Intravenous  Once 06/02/23 0034 06/02/23 0159   06/02/23 0045  metroNIDAZOLE  (FLAGYL ) IVPB 500 mg        500 mg 100 mL/hr over 60 Minutes Intravenous  Once 06/02/23 0034 06/02/23 0259   06/02/23 0045  vancomycin  (VANCOCIN ) IVPB 1000 mg/200 mL premix        1,000 mg 200 mL/hr over 60 Minutes Intravenous  Once 06/02/23 0034 06/02/23 1254        MEDICATIONS: Scheduled Meds:  amLODipine   10 mg Oral Daily   apixaban   2.5 mg Oral BID   carvedilol   25 mg Oral BID WC   cholecalciferol    Oral Daily   cloNIDine   0.1 mg Oral BID   hydrALAZINE   100 mg Oral Q8H   isosorbide  mononitrate  30 mg Oral Q T,Th,Sat-1800   And   isosorbide  mononitrate  60 mg Oral Q M,W,F,Su-1800   pantoprazole   40 mg Oral BID   rosuvastatin   10 mg Oral Daily   sevelamer  carbonate  2.4 g Oral TID WC   Continuous Infusions:  ampicillin -sulbactam (UNASYN ) IV 1.5 g (06/05/23 1232)   PRN Meds:.acetaminophen  **OR** acetaminophen , metoprolol  tartrate, ondansetron  **OR** ondansetron  (ZOFRAN ) IV, senna-docusate   I have personally reviewed following labs and imaging studies  LABORATORY DATA: CBC: Recent Labs   Lab 06/02/23 0034 06/03/23 0200 06/03/23 0816 06/04/23 0553 06/05/23 0800  WBC 6.4 4.8 3.7* 4.3 5.1  NEUTROABS 5.2 3.0  --   --   --   HGB 14.1 11.4* 12.3 11.2* 10.7*  HCT 45.5 35.7* 38.7 34.9* 33.4*  MCV 96.0 93.5 92.4 91.6 93.3  PLT 245 195 165 195 224    Basic Metabolic Panel: Recent Labs  Lab 06/02/23 0034 06/02/23 1132 06/02/23 1916 06/03/23 0816 06/04/23 0549 06/05/23 0800  NA 136 135  --  135 136 137  K >7.5* 6.2*  --  3.6 4.9 4.8  CL 96* 95*  --  97* 97* 95*  CO2 18* 23  --  25 21* 24  GLUCOSE 99 68*  --  126* 108* 103*  BUN 124* 51*  --  29* 51* 65*  CREATININE 16.81* 8.94*  --  6.34* 8.83* 10.94*  CALCIUM  9.6 8.9  --  8.5* 8.6* 8.8*  MG  --   --  2.1  --   --   --   PHOS  --  6.5* 7.2*  --  8.1* 8.7*    GFR: Estimated Creatinine Clearance: 3.7 mL/min (A) (by C-G formula based on SCr of 10.94 mg/dL (H)).  Liver Function Tests: Recent Labs  Lab 06/02/23 0034 06/02/23 1132 06/03/23 0816 06/04/23 0549 06/05/23 0800  AST 18  --  46*  --   --   ALT 10  --  16  --   --   ALKPHOS 72  --  50  --   --   BILITOT 0.9  --  0.7  --   --   PROT 8.0  --  5.9*  --   --   ALBUMIN  4.3 3.4* 2.8* 2.8* 2.9*   No results for input(s): LIPASE, AMYLASE in the last 168 hours. No results for input(s): AMMONIA in the last 168 hours.  Coagulation Profile: Recent Labs  Lab 06/02/23 0034  INR 1.1    Cardiac Enzymes: No results for input(s): CKTOTAL, CKMB, CKMBINDEX, TROPONINI in the last 168 hours.  BNP (last 3 results) No results for input(s): PROBNP in the last 8760 hours.  Lipid Profile: No results for input(s): CHOL, HDL, LDLCALC, TRIG, CHOLHDL, LDLDIRECT in the last 72 hours.  Thyroid  Function Tests: No results for input(s): TSH, T4TOTAL, FREET4, T3FREE, THYROIDAB in the last 72 hours.  Anemia Panel: No results for input(s): VITAMINB12, FOLATE, FERRITIN, TIBC, IRON, RETICCTPCT in the last 72  hours.  Urine analysis:    Component Value Date/Time   COLORURINE AMBER (A) 12/27/2021 1840   APPEARANCEUR TURBID (A) 12/27/2021 1840   LABSPEC 1.013 12/27/2021 1840   PHURINE 8.0 12/27/2021 1840   GLUCOSEU NEGATIVE 12/27/2021 1840   GLUCOSEU NEG mg/dL 93/98/7988 8055   HGBUR MODERATE (A) 12/27/2021 1840   BILIRUBINUR NEGATIVE 12/27/2021 1840   BILIRUBINUR negative 05/04/2018 0910   KETONESUR NEGATIVE 12/27/2021 1840   PROTEINUR >=300 (A) 12/27/2021 1840   UROBILINOGEN 0.2 05/04/2018 0910   UROBILINOGEN 1 02/14/2014 0946   NITRITE NEGATIVE 12/27/2021 1840   LEUKOCYTESUR LARGE (A) 12/27/2021 1840    Sepsis Labs: Lactic Acid, Venous    Component Value Date/Time   LATICACIDVEN 1.1 06/02/2023 0042    MICROBIOLOGY: Recent Results (from the past 240 hours)  Resp panel by RT-PCR (RSV, Flu A&B, Covid) Anterior Nasal Swab     Status: None   Collection Time: 06/02/23 12:34 AM   Specimen: Anterior Nasal Swab  Result Value Ref Range Status   SARS Coronavirus 2 by RT PCR NEGATIVE NEGATIVE Final   Influenza A by PCR NEGATIVE NEGATIVE Final   Influenza B by PCR NEGATIVE NEGATIVE Final    Comment: (NOTE) The Xpert Xpress SARS-CoV-2/FLU/RSV plus assay is intended as an aid in the diagnosis of influenza from Nasopharyngeal swab specimens and should not be used as a sole basis for treatment. Nasal washings and aspirates are unacceptable for Xpert Xpress SARS-CoV-2/FLU/RSV testing.  Fact Sheet for Patients: bloggercourse.com  Fact Sheet for Healthcare Providers: seriousbroker.it  This test is not yet approved or cleared by the [place]  FDA and has been authorized for detection and/or diagnosis of SARS-CoV-2 by FDA under an Emergency Use Authorization (EUA). This EUA will remain in effect (meaning this test can be used) for the duration of the COVID-19 declaration under Section 564(b)(1) of the Act, 21 U.S.C. section  360bbb-3(b)(1), unless the authorization is  terminated or revoked.     Resp Syncytial Virus by PCR NEGATIVE NEGATIVE Final    Comment: (NOTE) Fact Sheet for Patients: bloggercourse.com  Fact Sheet for Healthcare Providers: seriousbroker.it  This test is not yet approved or cleared by the [place]  FDA and has been authorized for detection and/or diagnosis of SARS-CoV-2 by FDA under an Emergency Use Authorization (EUA). This EUA will remain in effect (meaning this test can be used) for the duration of the COVID-19 declaration under Section 564(b)(1) of the Act, 21 U.S.C. section 360bbb-3(b)(1), unless the authorization is terminated or revoked.  Performed at [facility], [address] [place], [place] [address]   Blood Culture (routine x 2)     Status: None (Preliminary result)   Collection Time: 06/02/23 12:34 AM   Specimen: BLOOD  Result Value Ref Range Status   Specimen Description BLOOD SITE NOT SPECIFIED  Final   Special Requests   Final    BOTTLES DRAWN AEROBIC AND ANAEROBIC Blood Culture results may not be optimal due to an inadequate volume of blood received in culture bottles   Culture   Final    NO GROWTH 3 DAYS Performed at [facility], [address], [place], [place] [address]    Report Status PENDING  Incomplete  Blood Culture (routine x 2)     Status: None (Preliminary result)   Collection Time: 06/02/23 12:39 AM   Specimen: BLOOD  Result Value Ref Range Status   Specimen Description BLOOD SITE NOT SPECIFIED  Final   Special Requests   Final    BOTTLES DRAWN AEROBIC AND ANAEROBIC Blood Culture adequate volume   Culture  Setup Time   Final    GRAM POSITIVE COCCI ANAEROBIC BOTTLE ONLY CRITICAL RESULT CALLED TO, READ BACK BY AND VERIFIED WITH: PHARMD NATHAN GOAD ON 06/04/23 @ 2000 BY DRT    Culture   Final    GRAM POSITIVE COCCI IDENTIFICATION TO FOLLOW Performed at [facility],  [address], [place], [place] [address]    Report Status PENDING  Incomplete  Blood Culture ID Panel (Reflexed)     Status: None   Collection Time: 06/02/23 12:39 AM  Result Value Ref Range Status   Enterococcus faecalis NOT DETECTED NOT DETECTED Final   Enterococcus Faecium NOT DETECTED NOT DETECTED Final   Listeria monocytogenes NOT DETECTED NOT DETECTED Final   Staphylococcus species NOT DETECTED NOT DETECTED Final   Staphylococcus aureus (BCID) NOT DETECTED NOT DETECTED Final   Staphylococcus epidermidis NOT DETECTED NOT DETECTED Final   Staphylococcus lugdunensis NOT DETECTED NOT DETECTED Final   Streptococcus species NOT DETECTED NOT DETECTED Final   Streptococcus agalactiae NOT DETECTED NOT DETECTED Final   Streptococcus pneumoniae NOT DETECTED NOT DETECTED Final   Streptococcus pyogenes NOT DETECTED NOT DETECTED Final   A.calcoaceticus-baumannii NOT DETECTED NOT DETECTED Final   Bacteroides fragilis NOT DETECTED NOT DETECTED Final   Enterobacterales NOT DETECTED NOT DETECTED Final   Enterobacter cloacae complex NOT DETECTED NOT DETECTED Final   Escherichia coli NOT DETECTED NOT DETECTED Final   Klebsiella aerogenes NOT DETECTED NOT DETECTED Final   Klebsiella oxytoca NOT DETECTED NOT DETECTED Final   Klebsiella pneumoniae NOT DETECTED NOT DETECTED Final   Proteus species NOT DETECTED NOT DETECTED Final   Salmonella species NOT DETECTED NOT DETECTED Final   Serratia marcescens NOT DETECTED NOT DETECTED Final   Haemophilus influenzae NOT DETECTED NOT DETECTED Final   Neisseria meningitidis NOT DETECTED NOT DETECTED Final  Pseudomonas aeruginosa NOT DETECTED NOT DETECTED Final   Stenotrophomonas maltophilia NOT DETECTED NOT DETECTED Final   Candida albicans NOT DETECTED NOT DETECTED Final   Candida auris NOT DETECTED NOT DETECTED Final   Candida glabrata NOT DETECTED NOT DETECTED Final   Candida krusei NOT DETECTED NOT DETECTED Final   Candida parapsilosis NOT DETECTED NOT  DETECTED Final   Candida tropicalis NOT DETECTED NOT DETECTED Final   Cryptococcus neoformans/gattii NOT DETECTED NOT DETECTED Final    Comment: Performed at [facility], [address] [place], [place] [address]  MRSA Next Gen by PCR, Nasal     Status: Abnormal   Collection Time: 06/03/23  9:08 AM   Specimen: Nasal Mucosa; Nasal Swab  Result Value Ref Range Status   MRSA by PCR Next Gen DETECTED (A) NOT DETECTED Final    Comment: RESULT CALLED TO, READ BACK BY AND VERIFIED WITH: RN PEGGYE HERO ON [id number] @1252H  BY SM (NOTE) The GeneXpert MRSA Assay (FDA approved for NASAL specimens only), is one component of a comprehensive MRSA colonization surveillance program. It is not intended to diagnose MRSA infection nor to guide or monitor treatment for MRSA infections. Test performance is not FDA approved in patients less than 3 years old. Performed at [facility], [address], [place], [place] [address]     RADIOLOGY STUDIES/RESULTS: ECHOCARDIOGRAM COMPLETE Result Date: 06/03/2023    ECHOCARDIOGRAM REPORT   Patient Name:   Monique Henderson Date of Exam: 06/03/2023 Medical Rec #:  [id number]          Height:       63.0 in Accession #:    [id number]         Weight:       106.0 lb Date of Birth:  [birth year]           BSA:          1.476 m Patient Age:    72 years           BP:           131/83 mmHg Patient Gender: F                  HR:           73 bpm. Exam Location:  Inpatient Procedure: 2D Echo, Color Doppler, Cardiac Doppler and Strain Analysis Indications:    acute systolic chf  History:        Patient has prior history of Echocardiogram examinations, most                 recent 06/19/2022. COPD and end stage renal disease. Hepatitis                 C.; Risk Factors:Hypertension and Dyslipidemia.  Sonographer:    Tinnie Barefoot RDCS Referring Phys: [id number] DARRYLE NED O'NEAL  Sonographer Comments: Global longitudinal strain was attempted. IMPRESSIONS  1. Left ventricular ejection  fraction, by estimation, is 45%. The left ventricle has mildly decreased function. The left ventricle demonstrates regional wall motion abnormalities with apical akinesis. There is severe concentric left ventricular hypertrophy. Consider cardiac amyloidosis. Left ventricular diastolic parameters are consistent with Grade II diastolic dysfunction (pseudonormalization). The average left ventricular global longitudinal strain is -3.8 %. The global longitudinal strain is abnormal.  2. Right ventricular systolic function is moderately reduced. The right ventricular size is normal. There is mildly elevated pulmonary artery systolic pressure. The estimated right ventricular systolic pressure is 41.9 mmHg.  3. Left atrial size was moderately dilated.  4. The mitral valve is normal in structure. Trivial mitral valve regurgitation. No evidence of mitral stenosis.  5. Tricuspid valve regurgitation is mild to moderate.  6. The aortic valve is tricuspid. There is mild calcification of the aortic valve. Aortic valve regurgitation is not visualized. No aortic stenosis is present.  7. Aortic dilatation noted.  8. The inferior vena cava is normal in size with greater than 50% respiratory variability, suggesting right atrial pressure of 3 mmHg. FINDINGS  Left Ventricle: Left ventricular ejection fraction, by estimation, is 45%. The left ventricle has mildly decreased function. The left ventricle demonstrates regional wall motion abnormalities. The average left ventricular global longitudinal strain is -3.8 %. The global longitudinal strain is abnormal. The left ventricular internal cavity size was normal in size. There is severe concentric left ventricular hypertrophy. Left ventricular diastolic parameters are consistent with Grade II diastolic dysfunction (pseudonormalization). Right Ventricle: The right ventricular size is normal. No increase in right ventricular wall thickness. Right ventricular systolic function is moderately  reduced. There is mildly elevated pulmonary artery systolic pressure. The tricuspid regurgitant velocity is 3.12 m/s, and with an assumed right atrial pressure of 3 mmHg, the estimated right ventricular systolic pressure is 41.9 mmHg. Left Atrium: Left atrial size was moderately dilated. Right Atrium: Right atrial size was normal in size. Pericardium: There is no evidence of pericardial effusion. Mitral Valve: The mitral valve is normal in structure. Trivial mitral valve regurgitation. No evidence of mitral valve stenosis. Tricuspid Valve: The tricuspid valve is normal in structure. Tricuspid valve regurgitation is mild to moderate. Aortic Valve: The aortic valve is tricuspid. There is mild calcification of the aortic valve. Aortic valve regurgitation is not visualized. No aortic stenosis is present. Pulmonic Valve: The pulmonic valve was normal in structure. Pulmonic valve regurgitation is not visualized. Aorta: Aortic dilatation noted. Venous: The inferior vena cava is normal in size with greater than 50% respiratory variability, suggesting right atrial pressure of 3 mmHg. IAS/Shunts: No atrial level shunt detected by color flow Doppler.  LEFT VENTRICLE PLAX 2D LVIDd:         4.20 cm   Diastology LVIDs:         3.30 cm   LV e' medial:    5.22 cm/s LV PW:         1.50 cm   LV E/e' medial:  21.8 LV IVS:        1.30 cm   LV e' lateral:   3.70 cm/s LVOT diam:     2.10 cm   LV E/e' lateral: 30.8 LV SV:         50 LV SV Index:   34        2D Longitudinal Strain LVOT Area:     3.46 cm  2D Strain GLS Avg:     -3.8 %  RIGHT VENTRICLE            IVC RV Basal diam:  2.90 cm    IVC diam: 1.50 cm RV S prime:     6.20 cm/s TAPSE (M-mode): 1.1 cm LEFT ATRIUM           Index        RIGHT ATRIUM           Index LA diam:      4.00 cm 2.71 cm/m   RA Area:     14.60 cm LA Vol (A4C): 59.4 ml 40.23 ml/m  RA Volume:  30.50 ml  20.66 ml/m  AORTIC VALVE LVOT Vmax:   85.20 cm/s LVOT Vmean:  53.400 cm/s LVOT VTI:    0.145 m  AORTA Ao  Root diam: 2.70 cm Ao Asc diam:  2.90 cm MITRAL VALVE                TRICUSPID VALVE MV Area (PHT): 3.99 cm     TR Peak grad:   38.9 mmHg MV Decel Time: 190 msec     TR Vmax:        312.00 cm/s MV E velocity: 114.00 cm/s MV A velocity: 106.00 cm/s  SHUNTS MV E/A ratio:  1.08         Systemic VTI:  0.14 m                             Systemic Diam: 2.10 cm Dalton McleanMD Electronically signed by Ezra Kanner Signature Date/Time: 06/03/2023/6:04:44 PM    Final      LOS: 3 days   Donalda Applebaum, MD  [facility]    To contact the attending provider between 7A-7P or the covering provider during after hours 7P-7A, please log into the web site [email or web address] and access using universal [place] password for that web site. If you do not have the password, please call the hospital operator.  06/05/2023, 12:53 PM

## 2023-06-06 DIAGNOSIS — E875 Hyperkalemia: Secondary | ICD-10-CM | POA: Diagnosis not present

## 2023-06-06 DIAGNOSIS — R7989 Other specified abnormal findings of blood chemistry: Secondary | ICD-10-CM

## 2023-06-06 DIAGNOSIS — I5022 Chronic systolic (congestive) heart failure: Secondary | ICD-10-CM

## 2023-06-06 DIAGNOSIS — J9601 Acute respiratory failure with hypoxia: Secondary | ICD-10-CM | POA: Diagnosis not present

## 2023-06-06 LAB — CULTURE, BLOOD (ROUTINE X 2): Special Requests: ADEQUATE

## 2023-06-06 MED ORDER — AMOXICILLIN-POT CLAVULANATE 500-125 MG PO TABS: 1.0000 | ORAL_TABLET | ORAL | 0 refills | Status: AC

## 2023-06-06 NOTE — Plan of Care (Signed)
  Problem: Health Behavior/Discharge Planning: Goal: Ability to manage health-related needs will improve Outcome: Progressing   Problem: Clinical Measurements: Goal: Ability to maintain clinical measurements within normal limits will improve Outcome: Progressing Goal: Diagnostic test results will improve Outcome: Progressing   Problem: Activity: Goal: Risk for activity intolerance will decrease Outcome: Progressing   Problem: Safety: Goal: Ability to remain free from injury will improve Outcome: Progressing

## 2023-06-06 NOTE — Discharge Planning (Signed)
 [facility] Patient Discharge Orders- [facility] CLINIC: [facility]  Patient's name: ROSSLYN PASION Admit/DC Dates: 06/02/2023 - 06/06/2023  Discharge Diagnoses: AHRF/Hyperkalemia 2/2 missed dialysis -resolved  PNA - con. Augmentin    Recent Labs  Lab 06/05/23 0800  HGB 10.7*  K 4.8  CALCIUM  8.8*  PHOS 8.7*  ALBUMIN  2.9*   Aranesp : Given: --   Date of last dose/amount: --   PRBC's Given: -- Date/# of units: -- Mircera: per protocol   Outpatient Dialysis Orders:  -Heparin : No change -EDW 46 kg   -Bath: No change   Access intervention/Change: --  IV Antibiotics: --  Anticoagulation: --  OTHER/APPTS   Completed by: Maisie Ronnald Acosta PA-C   D/C Meds to be reconciled by nurse after every discharge.    Reviewed by: MD:______ RN_______

## 2023-06-06 NOTE — Progress Notes (Signed)
 [facility] Progress Note   Subjective:  Seen in room. No new complaints. Asking for breakfast. Completed dialysis yesterday net UF 1.6L   Objective Vitals:   06/06/23 0416 06/06/23 0454 06/06/23 0455 06/06/23 0800  BP:  (!) 173/84 (!) 173/84 139/81  Pulse:  66  64  Resp:  18  17  Temp:  98.1 F (36.7 C)  98.2 F (36.8 C)  TempSrc:  Oral  Oral  SpO2:  96%  98%  Weight: 49 kg      Physical Exam General: Alert female in NAD Heart: irregularly irregular rhythm, no murmur Lungs: CTA bilaterally, respirations unlabored Abdomen: Soft, non-distended, +BS Extremities: No edema b/l lower extremities Dialysis Access:  R internal jugular [facility]  Additional Objective Labs: Basic Metabolic Panel: Recent Labs  Lab 06/02/23 1916 06/03/23 0816 06/04/23 0549 06/05/23 0800  NA  --  135 136 137  K  --  3.6 4.9 4.8  CL  --  97* 97* 95*  CO2  --  25 21* 24  GLUCOSE  --  126* 108* 103*  BUN  --  29* 51* 65*  CREATININE  --  6.34* 8.83* 10.94*  CALCIUM   --  8.5* 8.6* 8.8*  PHOS 7.2*  --  8.1* 8.7*   Liver Function Tests: Recent Labs  Lab 06/02/23 0034 06/02/23 1132 06/03/23 0816 06/04/23 0549 06/05/23 0800  AST 18  --  46*  --   --   ALT 10  --  16  --   --   ALKPHOS 72  --  50  --   --   BILITOT 0.9  --  0.7  --   --   PROT 8.0  --  5.9*  --   --   ALBUMIN  4.3   < > 2.8* 2.8* 2.9*   < > = values in this interval not displayed.   No results for input(s): LIPASE, AMYLASE in the last 168 hours. CBC: Recent Labs  Lab 06/02/23 0034 06/03/23 0200 06/03/23 0816 06/04/23 0553 06/05/23 0800  WBC 6.4 4.8 3.7* 4.3 5.1  NEUTROABS 5.2 3.0  --   --   --   HGB 14.1 11.4* 12.3 11.2* 10.7*  HCT 45.5 35.7* 38.7 34.9* 33.4*  MCV 96.0 93.5 92.4 91.6 93.3  PLT 245 195 165 195 224   Blood Culture    Component Value Date/Time   SDES BLOOD SITE NOT SPECIFIED 06/02/2023 0039   SPECREQUEST  06/02/2023 0039    BOTTLES DRAWN AEROBIC AND ANAEROBIC Blood Culture  adequate volume   CULT (A) 06/02/2023 0039    ROTHIA SPECIES THE SIGNIFICANCE OF ISOLATING THIS ORGANISM FROM A SINGLE SET OF BLOOD CULTURES WHEN MULTIPLE SETS ARE DRAWN IS UNCERTAIN. PLEASE NOTIFY THE MICROBIOLOGY DEPARTMENT WITHIN ONE WEEK IF SPECIATION AND SENSITIVITIES ARE REQUIRED. Performed at [facility], [address], [place], [place] [address]    REPTSTATUS PENDING 06/02/2023 9960    Cardiac Enzymes: No results for input(s): CKTOTAL, CKMB, CKMBINDEX, TROPONINI in the last 168 hours. CBG: Recent Labs  Lab 06/04/23 2114  GLUCAP 111*   Iron Studies: No results for input(s): IRON, TIBC, TRANSFERRIN, FERRITIN in the last 72 hours. @lablastinr3 @ Studies/Results: No results found.  Medications:  ampicillin -sulbactam (UNASYN ) IV Stopped (06/06/23 0053)    amLODipine   10 mg Oral Daily   apixaban   2.5 mg Oral BID   carvedilol   25 mg Oral BID WC   cholecalciferol    Oral Daily   cloNIDine   0.1 mg Oral  BID   hydrALAZINE   100 mg Oral Q8H   isosorbide  mononitrate  30 mg Oral Q T,Th,Sat-1800   And   isosorbide  mononitrate  60 mg Oral Q M,W,F,Su-1800   pantoprazole   40 mg Oral BID   rosuvastatin   10 mg Oral Daily   sevelamer  carbonate  2.4 g Oral TID WC    Dialysis Orders: Outpatient HD prescription: THS, 3.5 hours, 2K, 2 calcium , no heparin , TDC, EDW 46 kg, BFR 400, DFR A1.5.  Hectorol  9 mcg every treatment.  Sensipar  60 mg every treatment.  Last Mircera 12/10.   Assessment/Plan: 1. Acute hypoxic respiratory failure: concern for aspiration pneumonia, on abx per primary team. Improved after dialysis. Now on RA.  2. ESRD: Azotemia/hyperkalemia on admission due to missed HD. Back on schedule. Next HD Tues.   3. HTN/volume: Volume improving.  continue home meds. UF with HD as tolerated. Still over EDW by weights here.  4. Anemia: Hgb above goal, no ESA indicated 5. Secondary hyperparathyroidism:  Calcium  controlled, phos elevated. Resume renvela  6.  Nutrition:  Renal diet with fluid restrictions 7. PAFlutter - on carvedilol , eliquis   8. CM - EF 45-50%   Maisie Ronnald Acosta PA-C [facility] 06/06/2023,10:01 AM

## 2023-06-06 NOTE — Discharge Summary (Signed)
 PATIENT DETAILS Name: Monique Henderson Age: 73 y.o. Sex: female Date of Birth: 05-05-1951 MRN: [id number]. Admitting Physician: Velna JONELLE Skeeter, MD ERE:Jcalzmz, Aliene, MD  Admit Date: 06/02/2023 Discharge date: 06/06/2023  Recommendations for Outpatient Follow-up:  Follow up with PCP in 1-2 weeks Please obtain CMP/CBC in one week  Admitted From:  Home  Disposition: Home   Discharge Condition: good  CODE STATUS:   Code Status: Full Code   Diet recommendation:  Diet Order             Diet - low sodium heart healthy           Diet renal with fluid restriction Fluid restriction: 1800 mL Fluid; Room service appropriate? Yes; Fluid consistency: Thin  Diet effective now                    Brief Summary: Patient is a 73 y.o.  female with history of ESRD on HD TTS, A-fib, CVA with residual dysarthria/right hemiparesis, HTN, HLD, COPD who presented with confusion and respiratory distress-she was found to have hyperkalemia and RLL PNA.   Significant events: 12/27>> HD catheter exchanged by Dr. Melia 01/01>> admit to [facility] for hypoxia/hyperkalemia-missed last HD.   Significant studies: 1/1>> CT head: Chronic atrophy/ischemic changes. 1/1>> CXR: New right basilar infiltrate.   Significant microbiology data: 1/1>> COVID/influenza/RSV PCR: Negative 1/1>> blood culture: 1/2-Rothia species (contamination-Case discussed with ID MD-Dr. Fleeta dam) 1/2>> echo: EF 45%   Procedures: None   Consults: Nephrology Cardiology  Brief Hospital Course: Acute hypoxemic respiratory failure secondary to right lobar pneumonia Improved-titrated to room air  1/2 blood cultures-positive for Rothia species-this is felt to be a contamination On Unasyn -switch to Augmentin  for a few more days Appreciate SLP eval-regular diet    Hyperkalemia Due to missed HD on 12/28 Treated with HD K levels have normalized.    ESRD on HD-TTS Nephrology following   Hypertensive emergency Secondary to  missed HD BP better   Elevated troponin levels Known history of CAD Elevated troponin levels likely reflecting of demand ischemia Cardiology recommending medical management.   Chronic HFrEF Euvolemic Volume removal with HD On beta-blocker/hydralazine /Imdur .   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   Weight as of this encounter: 48.5 kg   Discharge Diagnoses:  Principal Problem:   Acute hypoxic respiratory failure (HCC) Active Problems:   Hyperkalemia   Paroxysmal atrial flutter (HCC)   Tricuspid valve insufficiency   Elevated troponin level not due to acute coronary syndrome   History of aortic dissection   History of cocaine  abuse (HCC)   Noncompliance with medication regimen   End stage renal disease (HCC)   Chronic heart failure with mildly reduced ejection fraction (HFmrEF, 41-49%) (HCC)   Long term (current) use of anticoagulants   Discharge Instructions:  Activity:  As tolerated   Discharge Instructions     Call MD for:  difficulty breathing, headache or visual disturbances   Complete by: As directed    Call MD for:  extreme fatigue   Complete by: As directed    Call MD for:  persistant dizziness or light-headedness   Complete by: As directed  Diet - low sodium heart healthy   Complete by: As directed    Discharge instructions   Complete by: As directed    Follow with Primary MD  Shelda Atlas, MD in 1-2 weeks  Please get a complete blood count and chemistry panel checked by your Primary MD at your next visit, and again as instructed by your Primary MD.  Get  Medicines reviewed and adjusted: Please take all your medications with you for your next visit with your Primary MD  Laboratory/radiological data: Please request your Primary MD to go over all hospital tests and procedure/radiological results at the follow up, please ask your Primary MD to get all Hospital records sent to his/her office.  In some cases, they will be blood work, cultures and biopsy results pending at the time of your discharge. Please request that your primary care M.D. follows up on these results.  Also Note the following: If you experience worsening of your admission symptoms, develop shortness of breath, life threatening emergency, suicidal or homicidal thoughts you must seek medical attention immediately by calling 911 or calling your MD immediately  if symptoms less severe.  You must read complete instructions/literature along with all the possible adverse reactions/side effects for all the Medicines you take and that have been prescribed to you. Take any new Medicines after you have completely understood and accpet all the possible adverse reactions/side effects.   Do not drive when taking Pain medications or sleeping medications (Benzodaizepines)  Do not take more than prescribed Pain, Sleep and Anxiety Medications. It is not advisable to combine anxiety,sleep and pain medications without talking with your primary care practitioner  Special Instructions: If you have smoked or chewed Tobacco  in the last 2 yrs please stop smoking, stop any regular Alcohol   and or any Recreational drug use.  Wear Seat belts while driving.  Please note: You were cared for by a hospitalist during your hospital stay. Once you are discharged, your primary care physician will handle any further medical issues. Please note that NO REFILLS for any discharge medications will be authorized once you are discharged, as it is imperative that you return to your primary care physician (or establish a  relationship with a primary care physician if you do not have one) for your post hospital discharge needs so that they can reassess your need for medications and monitor your lab values.   Increase activity slowly   Complete by: As directed       Allergies as of 06/06/2023       Reactions   Shrimp [shellfish Allergy] Shortness Of Breath   Bactroban  [mupirocin ] Other (See Comments)   Sores in nose   Hydrocodone  Itching, Nausea Only, Nausea And Vomiting   Chlorhexidine  Gluconate Itching   Tylenol  [acetaminophen ] Itching   Takes Percocet at home 05/06/23   Zestril  [lisinopril ] Cough        Medication List     TAKE these medications    albuterol  (2.5 MG/3ML) 0.083% nebulizer solution Commonly known as: PROVENTIL  Take 2.5 mg by nebulization every 6 (six) hours as needed for wheezing or shortness of breath.   albuterol  108 (90 Base) MCG/ACT inhaler Commonly known as: VENTOLIN  HFA Inhale 2 puffs into the lungs every 6 (six) hours as needed for wheezing or shortness of breath.   amLODipine  10 MG tablet Commonly known as: NORVASC  Take 1 tablet (10 mg total) by mouth daily.   amoxicillin -clavulanate 500-125 MG tablet Commonly known as: Augmentin  Take 1 tablet by mouth  daily for 2 days. Start taking on: June 07, 2023   apixaban  2.5 MG Tabs tablet Commonly known as: ELIQUIS  Take 1 tablet (2.5 mg total) by mouth 2 (two) times daily.   carvedilol  25 MG tablet Commonly known as: COREG  Take 1 tablet (25 mg total) by mouth 2 (two) times daily with a meal.   cloNIDine  0.1 MG tablet Commonly known as: Catapres  Take 1 tablet (0.1 mg total) by mouth 2 (two) times daily.   hydrALAZINE  100 MG tablet Commonly known as: APRESOLINE  Take 1 tablet (100 mg total) by mouth every 8 (eight) hours.   isosorbide  mononitrate 30 MG 24 hr tablet Commonly known as: IMDUR  TAKE 1 TABLET (30MG ) ON DIALYSIS DAYS AND 2 TABLETS (60 MG) ON NON DIALYSIS DAYS.   naloxone  4 MG/0.1ML Liqd nasal spray  kit Commonly known as: NARCAN  1 spray as directed.   oxyCODONE -acetaminophen  10-325 MG tablet Commonly known as: PERCOCET Take 1 tablet by mouth in the morning and at bedtime.   pantoprazole  40 MG tablet Commonly known as: PROTONIX  TAKE 1 TABLET (40 MG TOTAL) BY MOUTH 2 (TWO) TIMES DAILY.   rosuvastatin  10 MG tablet Commonly known as: CRESTOR  TAKE 1 TABLET (10 MG TOTAL) BY MOUTH DAILY FOR CHOLESTEROL   senna-docusate 8.6-50 MG tablet Commonly known as: Senokot-S Take 1 tablet by mouth 2 (two) times daily between meals as needed for mild constipation.   sevelamer  carbonate 2.4 g Pack Commonly known as: RENVELA  Take 2.4 g by mouth 3 (three) times daily with meals.   traZODone  50 MG tablet Commonly known as: DESYREL  Take 25-50 mg by mouth at bedtime as needed for sleep.   VITAMIN D -3 PO Take 1 capsule by mouth daily.        Follow-up Information     Shelda Atlas, MD. Schedule an appointment as soon as possible for a visit in 1 week(s).   Specialty: Internal Medicine Contact information: [address] [place] [address] [phone]         Hemodialysis clinic Follow up.   Why: Keep your appointment on Tuesdays Thursdays and Saturdays               Allergies  Allergen Reactions   Shrimp [Shellfish Allergy] Shortness Of Breath   Bactroban  [Mupirocin ] Other (See Comments)    Sores in nose   Hydrocodone  Itching, Nausea Only and Nausea And Vomiting   Chlorhexidine  Gluconate Itching   Tylenol  [Acetaminophen ] Itching    Takes Percocet at home 05/06/23   Zestril  [Lisinopril ] Cough     Other Procedures/Studies: ECHOCARDIOGRAM COMPLETE Result Date: 06/03/2023    ECHOCARDIOGRAM REPORT   Patient Name:   ADRIAN SPECHT Date of Exam: 06/03/2023 Medical Rec #:  [id number]          Height:       63.0 in Accession #:    [id number]         Weight:       106.0 lb Date of Birth:  05-Mar-1951           BSA:          1.476 m Patient Age:    72 years            BP:           131/83 mmHg Patient Gender: F                  HR:           73 bpm. Exam Location:  Inpatient Procedure: 2D Echo, Color Doppler, Cardiac Doppler and Strain Analysis Indications:    acute systolic chf  History:        Patient has prior history of Echocardiogram examinations, most                 recent 06/19/2022. COPD and end stage renal disease. Hepatitis                 C.; Risk Factors:Hypertension and Dyslipidemia.  Sonographer:    Tinnie Barefoot RDCS Referring Phys: [id number] DARRYLE NED O'NEAL  Sonographer Comments: Global longitudinal strain was attempted. IMPRESSIONS  1. Left ventricular ejection fraction, by estimation, is 45%. The left ventricle has mildly decreased function. The left ventricle demonstrates regional wall motion abnormalities with apical akinesis. There is severe concentric left ventricular hypertrophy. Consider cardiac amyloidosis. Left ventricular diastolic parameters are consistent with Grade II diastolic dysfunction (pseudonormalization). The average left ventricular global longitudinal strain is -3.8 %. The global longitudinal strain is abnormal.  2. Right ventricular systolic function is moderately reduced. The right ventricular size is normal. There is mildly elevated pulmonary artery systolic pressure. The estimated right ventricular systolic pressure is 41.9 mmHg.  3. Left atrial size was moderately dilated.  4. The mitral valve is normal in structure. Trivial mitral valve regurgitation. No evidence of mitral stenosis.  5. Tricuspid valve regurgitation is mild to moderate.  6. The aortic valve is tricuspid. There is mild calcification of the aortic valve. Aortic valve regurgitation is not visualized. No aortic stenosis is present.  7. Aortic dilatation noted.  8. The inferior vena cava is normal in size with greater than 50% respiratory variability, suggesting right atrial pressure of 3 mmHg. FINDINGS  Left Ventricle: Left ventricular ejection fraction, by  estimation, is 45%. The left ventricle has mildly decreased function. The left ventricle demonstrates regional wall motion abnormalities. The average left ventricular global longitudinal strain is -3.8 %. The global longitudinal strain is abnormal. The left ventricular internal cavity size was normal in size. There is severe concentric left ventricular hypertrophy. Left ventricular diastolic parameters are consistent with Grade II diastolic dysfunction (pseudonormalization). Right Ventricle: The right ventricular size is normal. No increase in right ventricular wall thickness. Right ventricular systolic function is moderately reduced. There is mildly elevated pulmonary artery systolic pressure. The tricuspid regurgitant velocity is 3.12 m/s, and with an assumed right atrial pressure of 3 mmHg, the estimated right ventricular systolic pressure is 41.9 mmHg. Left Atrium: Left atrial size was moderately dilated. Right Atrium: Right atrial size was normal in size. Pericardium: There is no evidence of pericardial effusion. Mitral Valve: The mitral valve is normal in structure. Trivial mitral valve regurgitation. No evidence of mitral valve stenosis. Tricuspid Valve: The tricuspid valve is normal in structure. Tricuspid valve regurgitation is mild to moderate. Aortic Valve: The aortic valve is tricuspid. There is mild calcification of the aortic valve. Aortic valve regurgitation is not visualized. No aortic stenosis is present. Pulmonic Valve: The pulmonic valve was normal in structure. Pulmonic valve regurgitation is not visualized. Aorta: Aortic dilatation noted. Venous: The inferior vena cava is normal in size with greater than 50% respiratory variability, suggesting right atrial pressure of 3 mmHg. IAS/Shunts: No atrial level shunt detected by color flow Doppler.  LEFT VENTRICLE PLAX 2D LVIDd:         4.20 cm   Diastology LVIDs:         3.30 cm   LV e' medial:    5.22 cm/s LV PW:  1.50 cm   LV E/e' medial:   21.8 LV IVS:        1.30 cm   LV e' lateral:   3.70 cm/s LVOT diam:     2.10 cm   LV E/e' lateral: 30.8 LV SV:         50 LV SV Index:   34        2D Longitudinal Strain LVOT Area:     3.46 cm  2D Strain GLS Avg:     -3.8 %  RIGHT VENTRICLE            IVC RV Basal diam:  2.90 cm    IVC diam: 1.50 cm RV S prime:     6.20 cm/s TAPSE (M-mode): 1.1 cm LEFT ATRIUM           Index        RIGHT ATRIUM           Index LA diam:      4.00 cm 2.71 cm/m   RA Area:     14.60 cm LA Vol (A4C): 59.4 ml 40.23 ml/m  RA Volume:   30.50 ml  20.66 ml/m  AORTIC VALVE LVOT Vmax:   85.20 cm/s LVOT Vmean:  53.400 cm/s LVOT VTI:    0.145 m  AORTA Ao Root diam: 2.70 cm Ao Asc diam:  2.90 cm MITRAL VALVE                TRICUSPID VALVE MV Area (PHT): 3.99 cm     TR Peak grad:   38.9 mmHg MV Decel Time: 190 msec     TR Vmax:        312.00 cm/s MV E velocity: 114.00 cm/s MV A velocity: 106.00 cm/s  SHUNTS MV E/A ratio:  1.08         Systemic VTI:  0.14 m                             Systemic Diam: 2.10 cm Dalton McleanMD Electronically signed by Ezra Kanner Signature Date/Time: 06/03/2023/6:04:44 PM    Final    DG Chest Port 1 View Result Date: 06/02/2023 CLINICAL DATA:  Possible sepsis, initial encounter EXAM: PORTABLE CHEST 1 VIEW COMPARISON:  07/28/2022 FINDINGS: Cardiac shadow is enlarged but stable. Aortic calcifications and postoperative changes are again seen. Dialysis catheter is again noted on right stable from the prior exam. Lungs are well aerated bilaterally. New right basilar infiltrate is seen. IMPRESSION: New right basilar infiltrate. Electronically Signed   By: Oneil Devonshire M.D.   On: 06/02/2023 01:27   CT HEAD WO CONTRAST ( ) Result Date: 06/02/2023 CLINICAL DATA:  Altered mental status EXAM: CT HEAD WITHOUT CONTRAST TECHNIQUE: Contiguous axial images were obtained from the base of the skull through the vertex without intravenous contrast. RADIATION DOSE REDUCTION: This exam was performed according to the  departmental dose-optimization program which includes automated exposure control, adjustment of the mA and/or kV according to patient size and/or use of iterative reconstruction technique. COMPARISON:  02/23/2022 FINDINGS: Brain: No evidence of acute infarction, hemorrhage, hydrocephalus, extra-axial collection or mass lesion/mass effect. Mild atrophic changes and chronic white matter ischemic changes are seen. Findings consistent with prior left parietal infarct are noted. Vascular: No hyperdense vessel or unexpected calcification. Skull: Normal. Negative for fracture or focal lesion. Sinuses/Orbits: No acute finding. Other: None. IMPRESSION: Chronic atrophic and ischemic changes. No acute abnormality is noted. Electronically Signed   By:  Oneil Devonshire M.D.   On: 06/02/2023 01:25   DG Chest Port 1 View Result Date: 05/28/2023 CLINICAL DATA:  Bleeding from dialysis catheter site, initial encounter EXAM: PORTABLE CHEST 1 VIEW COMPARISON:  05/06/2023 FINDINGS: Cardiac shadow is enlarged. Postsurgical changes are again noted. Right jugular dialysis catheter is noted in satisfactory position. No complicating factors are seen. Lungs are clear. No bony abnormality is noted. IMPRESSION: No active disease. Electronically Signed   By: Oneil Devonshire M.D.   On: 05/28/2023 20:58     TODAY-DAY OF DISCHARGE:  Subjective:   Monique Henderson today has no headache,no chest abdominal pain,no new weakness tingling or numbness, feels much better wants to go home today.   Objective:   Blood pressure 139/81, pulse 64, temperature 98.2 F (36.8 C), temperature source Oral, resp. rate 17, weight 49 kg, SpO2 98%.  Intake/Output Summary (Last 24 hours) at 06/06/2023 1109 Last data filed at 06/05/2023 1119 Gross per 24 hour  Intake --  Output 1600 ml  Net -1600 ml   Filed Weights   06/05/23 0408 06/05/23 0755 06/06/23 0416  Weight: 49.9 kg 48.5 kg 49 kg    Exam: Awake Alert, Oriented *3, No new F.N deficits, Normal  affect [place].AT,PERRAL Supple Neck,No JVD, No cervical lymphadenopathy appriciated.  Symmetrical Chest wall movement, Good air movement bilaterally, CTAB RRR,No Gallops,Rubs or new Murmurs, No Parasternal Heave +ve B.Sounds, Abd Soft, Non tender, No organomegaly appriciated, No rebound -guarding or rigidity. No Cyanosis, Clubbing or edema, No new Rash or bruise   PERTINENT RADIOLOGIC STUDIES: No results found.   PERTINENT LAB RESULTS: CBC: Recent Labs    06/04/23 0553 06/05/23 0800  WBC 4.3 5.1  HGB 11.2* 10.7*  HCT 34.9* 33.4*  PLT 195 224   CMET CMP     Component Value Date/Time   NA 137 06/05/2023 0800   NA 140 05/13/2020 1108   NA 142 09/28/2012 0831   K 4.8 06/05/2023 0800   K 4.2 09/28/2012 0831   CL 95 (L) 06/05/2023 0800   CL 103 09/28/2012 0831   CO2 24 06/05/2023 0800   CO2 30 (H) 09/28/2012 0831   GLUCOSE 103 (H) 06/05/2023 0800   GLUCOSE 103 (H) 09/28/2012 0831   BUN 65 (H) 06/05/2023 0800   BUN 47 (H) 05/13/2020 1108   BUN 18.3 09/28/2012 0831   CREATININE 10.94 (H) 06/05/2023 0800   CREATININE 2.09 (H) 01/18/2017 1145   CREATININE 1.2 (H) 09/28/2012 0831   CALCIUM  8.8 (L) 06/05/2023 0800   CALCIUM  9.4 09/28/2012 0831   PROT 5.9 (L) 06/03/2023 0816   PROT 6.9 05/13/2020 1108   PROT 7.2 09/28/2012 0831   ALBUMIN  2.9 (L) 06/05/2023 0800   ALBUMIN  4.0 05/13/2020 1108   ALBUMIN  3.5 09/28/2012 0831   AST 46 (H) 06/03/2023 0816   AST 35 (H) 09/28/2012 0831   ALT 16 06/03/2023 0816   ALT 23 12/09/2016 1458   ALT 41 09/28/2012 0831   ALKPHOS 50 06/03/2023 0816   ALKPHOS 89 09/28/2012 0831   BILITOT 0.7 06/03/2023 0816   BILITOT 0.2 05/13/2020 1108   BILITOT 0.34 09/28/2012 0831   GFR 7.97 (LL) 02/14/2021 0840   GFRNONAA 3 (L) 06/05/2023 0800   GFRNONAA 23 (L) 12/09/2016 1458    GFR Estimated Creatinine Clearance: 3.7 mL/min (A) (by C-G formula based on SCr of 10.94 mg/dL (H)). No results for input(s): LIPASE, AMYLASE in the last 72  hours. No results for input(s): CKTOTAL, CKMB, CKMBINDEX, TROPONINI in the last 72  hours. Invalid input(s): POCBNP No results for input(s): DDIMER in the last 72 hours. No results for input(s): HGBA1C in the last 72 hours. No results for input(s): CHOL, HDL, LDLCALC, TRIG, CHOLHDL, LDLDIRECT in the last 72 hours. No results for input(s): TSH, T4TOTAL, T3FREE, THYROIDAB in the last 72 hours.  Invalid input(s): FREET3 No results for input(s): VITAMINB12, FOLATE, FERRITIN, TIBC, IRON, RETICCTPCT in the last 72 hours. Coags: No results for input(s): INR in the last 72 hours.  Invalid input(s): PT Microbiology: Recent Results (from the past 240 hours)  Resp panel by RT-PCR (RSV, Flu A&B, Covid) Anterior Nasal Swab     Status: None   Collection Time: 06/02/23 12:34 AM   Specimen: Anterior Nasal Swab  Result Value Ref Range Status   SARS Coronavirus 2 by RT PCR NEGATIVE NEGATIVE Final   Influenza A by PCR NEGATIVE NEGATIVE Final   Influenza B by PCR NEGATIVE NEGATIVE Final    Comment: (NOTE) The Xpert Xpress SARS-CoV-2/FLU/RSV plus assay is intended as an aid in the diagnosis of influenza from Nasopharyngeal swab specimens and should not be used as a sole basis for treatment. Nasal washings and aspirates are unacceptable for Xpert Xpress SARS-CoV-2/FLU/RSV testing.  Fact Sheet for Patients: bloggercourse.com  Fact Sheet for Healthcare Providers: seriousbroker.it  This test is not yet approved or cleared by the [place]  FDA and has been authorized for detection and/or diagnosis of SARS-CoV-2 by FDA under an Emergency Use Authorization (EUA). This EUA will remain in effect (meaning this test can be used) for the duration of the COVID-19 declaration under Section 564(b)(1) of the Act, 21 U.S.C. section 360bbb-3(b)(1), unless the authorization is terminated or revoked.     Resp  Syncytial Virus by PCR NEGATIVE NEGATIVE Final    Comment: (NOTE) Fact Sheet for Patients: bloggercourse.com  Fact Sheet for Healthcare Providers: seriousbroker.it  This test is not yet approved or cleared by the [place]  FDA and has been authorized for detection and/or diagnosis of SARS-CoV-2 by FDA under an Emergency Use Authorization (EUA). This EUA will remain in effect (meaning this test can be used) for the duration of the COVID-19 declaration under Section 564(b)(1) of the Act, 21 U.S.C. section 360bbb-3(b)(1), unless the authorization is terminated or revoked.  Performed at [facility], [address] [place], [place] [address]   Blood Culture (routine x 2)     Status: None (Preliminary result)   Collection Time: 06/02/23 12:34 AM   Specimen: BLOOD  Result Value Ref Range Status   Specimen Description BLOOD SITE NOT SPECIFIED  Final   Special Requests   Final    BOTTLES DRAWN AEROBIC AND ANAEROBIC Blood Culture results may not be optimal due to an inadequate volume of blood received in culture bottles   Culture   Final    NO GROWTH 4 DAYS Performed at [facility], [address] [place], [place] [address]    Report Status PENDING  Incomplete  Blood Culture (routine x 2)     Status: Abnormal (Preliminary result)   Collection Time: 06/02/23 12:39 AM   Specimen: BLOOD  Result Value Ref Range Status   Specimen Description BLOOD SITE NOT SPECIFIED  Final   Special Requests   Final    BOTTLES DRAWN AEROBIC AND ANAEROBIC Blood Culture adequate volume   Culture  Setup Time   Final    GRAM POSITIVE COCCI ANAEROBIC BOTTLE ONLY CRITICAL RESULT CALLED TO, READ BACK BY AND VERIFIED WITH: PHARMD NATHAN GOAD  ON 06/04/23 @ 2000 BY DRT    Culture (A)  Final    ROTHIA SPECIES THE SIGNIFICANCE OF ISOLATING THIS ORGANISM FROM A SINGLE SET OF BLOOD CULTURES WHEN MULTIPLE SETS ARE DRAWN IS UNCERTAIN. PLEASE NOTIFY THE  MICROBIOLOGY DEPARTMENT WITHIN ONE WEEK IF SPECIATION AND SENSITIVITIES ARE REQUIRED. Performed at [facility], [address] [place], [place] [address]    Report Status PENDING  Incomplete  Blood Culture ID Panel (Reflexed)     Status: None   Collection Time: 06/02/23 12:39 AM  Result Value Ref Range Status   Enterococcus faecalis NOT DETECTED NOT DETECTED Final   Enterococcus Faecium NOT DETECTED NOT DETECTED Final   Listeria monocytogenes NOT DETECTED NOT DETECTED Final   Staphylococcus species NOT DETECTED NOT DETECTED Final   Staphylococcus aureus (BCID) NOT DETECTED NOT DETECTED Final   Staphylococcus epidermidis NOT DETECTED NOT DETECTED Final   Staphylococcus lugdunensis NOT DETECTED NOT DETECTED Final   Streptococcus species NOT DETECTED NOT DETECTED Final   Streptococcus agalactiae NOT DETECTED NOT DETECTED Final   Streptococcus pneumoniae NOT DETECTED NOT DETECTED Final   Streptococcus pyogenes NOT DETECTED NOT DETECTED Final   A.calcoaceticus-baumannii NOT DETECTED NOT DETECTED Final   Bacteroides fragilis NOT DETECTED NOT DETECTED Final   Enterobacterales NOT DETECTED NOT DETECTED Final   Enterobacter cloacae complex NOT DETECTED NOT DETECTED Final   Escherichia coli NOT DETECTED NOT DETECTED Final   Klebsiella aerogenes NOT DETECTED NOT DETECTED Final   Klebsiella oxytoca NOT DETECTED NOT DETECTED Final   Klebsiella pneumoniae NOT DETECTED NOT DETECTED Final   Proteus species NOT DETECTED NOT DETECTED Final   Salmonella species NOT DETECTED NOT DETECTED Final   Serratia marcescens NOT DETECTED NOT DETECTED Final   Haemophilus influenzae NOT DETECTED NOT DETECTED Final   Neisseria meningitidis NOT DETECTED NOT DETECTED Final   Pseudomonas aeruginosa NOT DETECTED NOT DETECTED Final   Stenotrophomonas maltophilia NOT DETECTED NOT DETECTED Final   Candida albicans NOT DETECTED NOT DETECTED Final   Candida auris NOT DETECTED NOT DETECTED Final   Candida glabrata  NOT DETECTED NOT DETECTED Final   Candida krusei NOT DETECTED NOT DETECTED Final   Candida parapsilosis NOT DETECTED NOT DETECTED Final   Candida tropicalis NOT DETECTED NOT DETECTED Final   Cryptococcus neoformans/gattii NOT DETECTED NOT DETECTED Final    Comment: Performed at [facility], [address] [place], [place] [address]  MRSA Next Gen by PCR, Nasal     Status: Abnormal   Collection Time: 06/03/23  9:08 AM   Specimen: Nasal Mucosa; Nasal Swab  Result Value Ref Range Status   MRSA by PCR Next Gen DETECTED (A) NOT DETECTED Final    Comment: RESULT CALLED TO, READ BACK BY AND VERIFIED WITH: RN PEGGYE HERO ON [id number] @1252H  BY SM (NOTE) The GeneXpert MRSA Assay (FDA approved for NASAL specimens only), is one component of a comprehensive MRSA colonization surveillance program. It is not intended to diagnose MRSA infection nor to guide or monitor treatment for MRSA infections. Test performance is not FDA approved in patients less than 73 years old. Performed at [facility], [address], [place], [place] [address]     FURTHER DISCHARGE INSTRUCTIONS:  Get Medicines reviewed and adjusted: Please take all your medications with you for your next visit with your Primary MD  Laboratory/radiological data: Please request your Primary MD to go over all hospital tests and procedure/radiological results at the follow up, please ask your Primary MD to  get all Hospital records sent to his/her office.  In some cases, they will be blood work, cultures and biopsy results pending at the time of your discharge. Please request that your primary care M.D. goes through all the records of your hospital data and follows up on these results.  Also Note the following: If you experience worsening of your admission symptoms, develop shortness of breath, life threatening emergency, suicidal or homicidal thoughts you must seek medical attention immediately by calling 911 or calling your MD  immediately  if symptoms less severe.  You must read complete instructions/literature along with all the possible adverse reactions/side effects for all the Medicines you take and that have been prescribed to you. Take any new Medicines after you have completely understood and accpet all the possible adverse reactions/side effects.   Do not drive when taking Pain medications or sleeping medications (Benzodaizepines)  Do not take more than prescribed Pain, Sleep and Anxiety Medications. It is not advisable to combine anxiety,sleep and pain medications without talking with your primary care practitioner  Special Instructions: If you have smoked or chewed Tobacco  in the last 2 yrs please stop smoking, stop any regular Alcohol   and or any Recreational drug use.  Wear Seat belts while driving.  Please note: You were cared for by a hospitalist during your hospital stay. Once you are discharged, your primary care physician will handle any further medical issues. Please note that NO REFILLS for any discharge medications will be authorized once you are discharged, as it is imperative that you return to your primary care physician (or establish a relationship with a primary care physician if you do not have one) for your post hospital discharge needs so that they can reassess your need for medications and monitor your lab values.  Total Time spent coordinating discharge including counseling, education and face to face time equals greater than 30 minutes.  Signed: Dimas Scheck 06/06/2023 11:09 AM

## 2023-06-07 ENCOUNTER — Telehealth (HOSPITAL_COMMUNITY): Payer: Self-pay | Admitting: Nephrology

## 2023-06-07 LAB — CULTURE, BLOOD (ROUTINE X 2): Culture: NO GROWTH

## 2023-06-07 NOTE — Progress Notes (Signed)
 Late Note Entry- Jan. 6, 2025  Pt was d/c yesterday. Contacted [facility] this morning to be advised of pt's d/c date and that pt should resume care tomorrow.   Olivia Canter Renal Navigator [phone]

## 2023-06-07 NOTE — Telephone Encounter (Signed)
 Transition of care contact from inpatient facility  Date of Discharge: 06/06/23 Date of Contact:06/07/23 - attempt #1 Method of contact: Phone  Attempted to contact patient to discuss transition of care from inpatient admission. Patient did not answer the phone. Message was left on the patient's voicemail with call back number [phone].  Izetta Boehringer, PA-C [employer] Pager [phone]

## 2023-06-09 ENCOUNTER — Telehealth (HOSPITAL_COMMUNITY): Payer: Self-pay | Admitting: Nephrology

## 2023-06-09 NOTE — Telephone Encounter (Signed)
 Transition of care contact from inpatient facility  Date of Discharge: 06/06/23 Date of Contact: 06/09/23 - attempt #2 Method of contact: Phone  Attempted to contact patient to discuss transition of care from inpatient admission. Patient did not answer the phone. Message was left on the patient's voicemail with call back number [phone].  Izetta Boehringer, PA-C [employer] Pager [phone]

## 2023-06-17 ENCOUNTER — Other Ambulatory Visit: Payer: Self-pay

## 2023-06-17 ENCOUNTER — Encounter (HOSPITAL_COMMUNITY): Payer: Self-pay | Admitting: Emergency Medicine

## 2023-06-17 ENCOUNTER — Inpatient Hospital Stay (HOSPITAL_COMMUNITY)
Admission: EM | Admit: 2023-06-17 | Discharge: 2023-06-19 | DRG: 291 | Disposition: A | Discharge status: Home / Self Care | Payer: Medicare Other | Attending: Family Medicine | Admitting: Family Medicine

## 2023-06-17 ENCOUNTER — Emergency Department (HOSPITAL_COMMUNITY): Payer: Medicare Other

## 2023-06-17 DIAGNOSIS — Y95 Nosocomial condition: Secondary | ICD-10-CM | POA: Diagnosis present

## 2023-06-17 DIAGNOSIS — Z91013 Allergy to seafood: Secondary | ICD-10-CM

## 2023-06-17 DIAGNOSIS — D631 Anemia in chronic kidney disease: Secondary | ICD-10-CM | POA: Diagnosis present

## 2023-06-17 DIAGNOSIS — Z8673 Personal history of transient ischemic attack (TIA), and cerebral infarction without residual deficits: Secondary | ICD-10-CM | POA: Diagnosis not present

## 2023-06-17 DIAGNOSIS — J449 Chronic obstructive pulmonary disease, unspecified: Secondary | ICD-10-CM | POA: Diagnosis present

## 2023-06-17 DIAGNOSIS — R0602 Shortness of breath: Secondary | ICD-10-CM | POA: Diagnosis not present

## 2023-06-17 DIAGNOSIS — Z885 Allergy status to narcotic agent status: Secondary | ICD-10-CM

## 2023-06-17 DIAGNOSIS — Z8249 Family history of ischemic heart disease and other diseases of the circulatory system: Secondary | ICD-10-CM

## 2023-06-17 DIAGNOSIS — Z888 Allergy status to other drugs, medicaments and biological substances status: Secondary | ICD-10-CM

## 2023-06-17 DIAGNOSIS — Z7901 Long term (current) use of anticoagulants: Secondary | ICD-10-CM

## 2023-06-17 DIAGNOSIS — J9601 Acute respiratory failure with hypoxia: Secondary | ICD-10-CM | POA: Diagnosis not present

## 2023-06-17 DIAGNOSIS — Z820 Family history of epilepsy and other diseases of the nervous system: Secondary | ICD-10-CM

## 2023-06-17 DIAGNOSIS — J9621 Acute and chronic respiratory failure with hypoxia: Secondary | ICD-10-CM | POA: Insufficient documentation

## 2023-06-17 DIAGNOSIS — E875 Hyperkalemia: Secondary | ICD-10-CM | POA: Diagnosis present

## 2023-06-17 DIAGNOSIS — Z853 Personal history of malignant neoplasm of breast: Secondary | ICD-10-CM

## 2023-06-17 DIAGNOSIS — E785 Hyperlipidemia, unspecified: Secondary | ICD-10-CM | POA: Diagnosis present

## 2023-06-17 DIAGNOSIS — E8770 Fluid overload, unspecified: Secondary | ICD-10-CM | POA: Diagnosis present

## 2023-06-17 DIAGNOSIS — K219 Gastro-esophageal reflux disease without esophagitis: Secondary | ICD-10-CM | POA: Diagnosis present

## 2023-06-17 DIAGNOSIS — J440 Chronic obstructive pulmonary disease with acute lower respiratory infection: Secondary | ICD-10-CM | POA: Diagnosis present

## 2023-06-17 DIAGNOSIS — Z923 Personal history of irradiation: Secondary | ICD-10-CM

## 2023-06-17 DIAGNOSIS — Z1152 Encounter for screening for COVID-19: Secondary | ICD-10-CM

## 2023-06-17 DIAGNOSIS — I480 Paroxysmal atrial fibrillation: Secondary | ICD-10-CM | POA: Diagnosis present

## 2023-06-17 DIAGNOSIS — I132 Hypertensive heart and chronic kidney disease with heart failure and with stage 5 chronic kidney disease, or end stage renal disease: Secondary | ICD-10-CM | POA: Diagnosis not present

## 2023-06-17 DIAGNOSIS — Z992 Dependence on renal dialysis: Secondary | ICD-10-CM

## 2023-06-17 DIAGNOSIS — Z886 Allergy status to analgesic agent status: Secondary | ICD-10-CM

## 2023-06-17 DIAGNOSIS — N2581 Secondary hyperparathyroidism of renal origin: Secondary | ICD-10-CM | POA: Diagnosis present

## 2023-06-17 DIAGNOSIS — Z800 Family history of malignant neoplasm of digestive organs: Secondary | ICD-10-CM

## 2023-06-17 DIAGNOSIS — I161 Hypertensive emergency: Secondary | ICD-10-CM | POA: Diagnosis present

## 2023-06-17 DIAGNOSIS — F17200 Nicotine dependence, unspecified, uncomplicated: Secondary | ICD-10-CM | POA: Diagnosis present

## 2023-06-17 DIAGNOSIS — R000 Tachycardia, unspecified: Secondary | ICD-10-CM | POA: Diagnosis present

## 2023-06-17 DIAGNOSIS — I071 Rheumatic tricuspid insufficiency: Secondary | ICD-10-CM | POA: Diagnosis present

## 2023-06-17 DIAGNOSIS — Z86000 Personal history of in-situ neoplasm of breast: Secondary | ICD-10-CM

## 2023-06-17 DIAGNOSIS — R9431 Abnormal electrocardiogram [ECG] [EKG]: Secondary | ICD-10-CM | POA: Diagnosis present

## 2023-06-17 DIAGNOSIS — I5043 Acute on chronic combined systolic (congestive) and diastolic (congestive) heart failure: Secondary | ICD-10-CM | POA: Diagnosis present

## 2023-06-17 DIAGNOSIS — Z8619 Personal history of other infectious and parasitic diseases: Secondary | ICD-10-CM

## 2023-06-17 DIAGNOSIS — Z8679 Personal history of other diseases of the circulatory system: Secondary | ICD-10-CM

## 2023-06-17 DIAGNOSIS — Z881 Allergy status to other antibiotic agents status: Secondary | ICD-10-CM

## 2023-06-17 DIAGNOSIS — I69351 Hemiplegia and hemiparesis following cerebral infarction affecting right dominant side: Secondary | ICD-10-CM

## 2023-06-17 DIAGNOSIS — Z87891 Personal history of nicotine dependence: Secondary | ICD-10-CM

## 2023-06-17 DIAGNOSIS — F1411 Cocaine abuse, in remission: Secondary | ICD-10-CM | POA: Diagnosis present

## 2023-06-17 DIAGNOSIS — J189 Pneumonia, unspecified organism: Secondary | ICD-10-CM | POA: Diagnosis present

## 2023-06-17 DIAGNOSIS — N186 End stage renal disease: Secondary | ICD-10-CM | POA: Diagnosis present

## 2023-06-17 DIAGNOSIS — Z79899 Other long term (current) drug therapy: Secondary | ICD-10-CM

## 2023-06-17 DIAGNOSIS — Z981 Arthrodesis status: Secondary | ICD-10-CM

## 2023-06-17 DIAGNOSIS — Z833 Family history of diabetes mellitus: Secondary | ICD-10-CM

## 2023-06-17 DIAGNOSIS — I4510 Unspecified right bundle-branch block: Secondary | ICD-10-CM | POA: Diagnosis present

## 2023-06-17 LAB — CBC
HCT: 34.5 % — ABNORMAL LOW (ref 36.0–46.0)
Hemoglobin: 10.7 g/dL — ABNORMAL LOW (ref 12.0–15.0)
MCH: 28.9 pg (ref 26.0–34.0)
MCHC: 31 g/dL (ref 30.0–36.0)
MCV: 93.2 fL (ref 80.0–100.0)
Platelets: 219 10*3/uL (ref 150–400)
RBC: 3.7 MIL/uL — ABNORMAL LOW (ref 3.87–5.11)
RDW: 17.7 % — ABNORMAL HIGH (ref 11.5–15.5)
WBC: 7.6 10*3/uL (ref 4.0–10.5)
nRBC: 0 % (ref 0.0–0.2)

## 2023-06-17 LAB — BASIC METABOLIC PANEL
Anion gap: 18 — ABNORMAL HIGH (ref 5–15)
BUN: 57 mg/dL — ABNORMAL HIGH (ref 8–23)
CO2: 25 mmol/L (ref 22–32)
Calcium: 9.1 mg/dL (ref 8.9–10.3)
Chloride: 96 mmol/L — ABNORMAL LOW (ref 98–111)
Creatinine, Ser: 8.86 mg/dL — ABNORMAL HIGH (ref 0.44–1.00)
GFR, Estimated: 4 mL/min — ABNORMAL LOW (ref >60)
Glucose, Bld: 94 mg/dL (ref 70–99)
Potassium: 5.3 mmol/L — ABNORMAL HIGH (ref 3.5–5.1)
Sodium: 139 mmol/L (ref 135–145)

## 2023-06-17 LAB — RESP PANEL BY RT-PCR (RSV, FLU A&B, COVID)  RVPGX2
Influenza A by PCR: NEGATIVE
Influenza B by PCR: NEGATIVE
Resp Syncytial Virus by PCR: NEGATIVE
SARS Coronavirus 2 by RT PCR: NEGATIVE

## 2023-06-17 MED ORDER — OXYCODONE-ACETAMINOPHEN 10-325 MG PO TABS: 1.0000 | ORAL_TABLET | Freq: Two times a day (BID) | ORAL | Status: DC | PRN

## 2023-06-17 MED ORDER — SODIUM CHLORIDE 0.9 % IV SOLN: 2.0000 g | INTRAVENOUS | Status: DC

## 2023-06-17 MED ORDER — HEPARIN SODIUM (PORCINE) 1000 UNIT/ML DIALYSIS
1000.0000 [IU] | INTRAMUSCULAR | Status: DC | PRN
  Administered 2023-06-17: 3800 [IU]
  Filled 2023-06-17 (×2): qty 1

## 2023-06-17 MED ORDER — OXYCODONE-ACETAMINOPHEN 5-325 MG PO TABS
1.0000 | ORAL_TABLET | Freq: Two times a day (BID) | ORAL | Status: DC | PRN
  Administered 2023-06-18 – 2023-06-19 (×4): 1 via ORAL
  Filled 2023-06-17 (×4): qty 1

## 2023-06-17 MED ORDER — PENTAFLUOROPROP-TETRAFLUOROETH EX AERO
1.0000 | INHALATION_SPRAY | CUTANEOUS | Status: DC | PRN
Start: 2023-06-17 — End: 2023-06-17

## 2023-06-17 MED ORDER — ACETAMINOPHEN 325 MG PO TABS
650.0000 mg | ORAL_TABLET | Freq: Four times a day (QID) | ORAL | Status: DC | PRN
  Administered 2023-06-19: 650 mg via ORAL
  Filled 2023-06-17: qty 2

## 2023-06-17 MED ORDER — HYDRALAZINE HCL 25 MG PO TABS: 100.0000 mg | ORAL_TABLET | Freq: Once | ORAL | Status: DC

## 2023-06-17 MED ORDER — APIXABAN 2.5 MG PO TABS
2.5000 mg | ORAL_TABLET | Freq: Two times a day (BID) | ORAL | Status: DC
  Administered 2023-06-17 – 2023-06-19 (×4): 2.5 mg via ORAL
  Filled 2023-06-17 (×4): qty 1

## 2023-06-17 MED ORDER — LIDOCAINE HCL (PF) 1 % IJ SOLN: 5.0000 mL | INTRAMUSCULAR | Status: DC | PRN

## 2023-06-17 MED ORDER — NITROGLYCERIN IN D5W 200-5 MCG/ML-% IV SOLN
0.0000 ug/min | INTRAVENOUS | Status: DC
  Administered 2023-06-17: 50 ug/min via INTRAVENOUS
  Administered 2023-06-17: 115 ug/min via INTRAVENOUS
  Administered 2023-06-17: 85 ug/min via INTRAVENOUS
  Administered 2023-06-17: 80 ug/min via INTRAVENOUS
  Administered 2023-06-17: 70 ug/min via INTRAVENOUS
  Administered 2023-06-17: 75 ug/min via INTRAVENOUS
  Administered 2023-06-18: 200 ug/min via INTRAVENOUS
  Filled 2023-06-17 (×3): qty 250

## 2023-06-17 MED ORDER — HYDRALAZINE HCL 50 MG PO TABS
100.0000 mg | ORAL_TABLET | Freq: Three times a day (TID) | ORAL | Status: DC
  Administered 2023-06-17 – 2023-06-19 (×6): 100 mg via ORAL
  Filled 2023-06-17 (×6): qty 2

## 2023-06-17 MED ORDER — HYDRALAZINE HCL 20 MG/ML IJ SOLN
20.0000 mg | Freq: Once | INTRAMUSCULAR | Status: AC
  Administered 2023-06-17: 20 mg via INTRAVENOUS
  Filled 2023-06-17: qty 1

## 2023-06-17 MED ORDER — CLONIDINE HCL 0.1 MG PO TABS
0.1000 mg | ORAL_TABLET | Freq: Two times a day (BID) | ORAL | Status: DC
  Administered 2023-06-17 – 2023-06-18 (×2): 0.1 mg via ORAL
  Filled 2023-06-17 (×2): qty 1

## 2023-06-17 MED ORDER — PANTOPRAZOLE SODIUM 40 MG PO TBEC
40.0000 mg | DELAYED_RELEASE_TABLET | Freq: Two times a day (BID) | ORAL | Status: DC
  Administered 2023-06-17 – 2023-06-19 (×4): 40 mg via ORAL
  Filled 2023-06-17 (×4): qty 1

## 2023-06-17 MED ORDER — LORAZEPAM 2 MG/ML IJ SOLN
INTRAMUSCULAR | Status: AC
  Filled 2023-06-17: qty 1

## 2023-06-17 MED ORDER — SODIUM CHLORIDE 0.9 % IV SOLN
2.0000 g | Freq: Once | INTRAVENOUS | Status: AC
  Administered 2023-06-17: 2 g via INTRAVENOUS
  Filled 2023-06-17: qty 12.5

## 2023-06-17 MED ORDER — ALTEPLASE 2 MG IJ SOLR: 2.0000 mg | Freq: Once | INTRAMUSCULAR | Status: DC | PRN

## 2023-06-17 MED ORDER — ACETAMINOPHEN 650 MG RE SUPP
650.0000 mg | Freq: Four times a day (QID) | RECTAL | Status: DC | PRN
Start: 2023-06-17 — End: 2023-06-20

## 2023-06-17 MED ORDER — OXYCODONE HCL 5 MG PO TABS
5.0000 mg | ORAL_TABLET | Freq: Two times a day (BID) | ORAL | Status: DC | PRN
  Administered 2023-06-18 – 2023-06-19 (×2): 5 mg via ORAL
  Filled 2023-06-17 (×2): qty 1

## 2023-06-17 MED ORDER — AMLODIPINE BESYLATE 10 MG PO TABS
10.0000 mg | ORAL_TABLET | Freq: Every day | ORAL | Status: DC
  Administered 2023-06-18 – 2023-06-19 (×2): 10 mg via ORAL
  Filled 2023-06-17: qty 2
  Filled 2023-06-17: qty 1

## 2023-06-17 MED ORDER — CLONIDINE HCL 0.1 MG PO TABS: 0.1000 mg | ORAL_TABLET | Freq: Once | ORAL | Status: DC

## 2023-06-17 MED ORDER — ROSUVASTATIN CALCIUM 5 MG PO TABS
10.0000 mg | ORAL_TABLET | Freq: Every day | ORAL | Status: DC
  Administered 2023-06-18 – 2023-06-19 (×2): 10 mg via ORAL
  Filled 2023-06-17 (×2): qty 2

## 2023-06-17 MED ORDER — CARVEDILOL 25 MG PO TABS
25.0000 mg | ORAL_TABLET | Freq: Two times a day (BID) | ORAL | Status: DC
  Administered 2023-06-18 – 2023-06-19 (×3): 25 mg via ORAL
  Filled 2023-06-17: qty 2
  Filled 2023-06-17: qty 1
  Filled 2023-06-17: qty 2

## 2023-06-17 MED ORDER — LORAZEPAM 2 MG/ML IJ SOLN
0.5000 mg | Freq: Once | INTRAMUSCULAR | Status: AC
  Administered 2023-06-17: 0.5 mg via INTRAVENOUS

## 2023-06-17 MED ORDER — LIDOCAINE-PRILOCAINE 2.5-2.5 % EX CREA: 1.0000 | TOPICAL_CREAM | CUTANEOUS | Status: DC | PRN

## 2023-06-17 MED ORDER — NITROGLYCERIN 0.4 MG SL SUBL
0.4000 mg | SUBLINGUAL_TABLET | SUBLINGUAL | Status: DC | PRN
  Administered 2023-06-17: 0.4 mg via SUBLINGUAL
  Filled 2023-06-17: qty 1

## 2023-06-17 MED ORDER — ANTICOAGULANT SODIUM CITRATE 4% (200MG/5ML) IV SOLN
5.0000 mL | Status: DC | PRN
  Filled 2023-06-17: qty 5

## 2023-06-17 NOTE — Progress Notes (Signed)
ED Pharmacy Antibiotic Sign Off An antibiotic consult was received from an ED provider for cefepine per pharmacy dosing for HCAP. A chart review was completed to assess appropriateness.   The following one time order(s) were placed:  Cefepime 2G IV x1  Further antibiotic and/or antibiotic pharmacy consults should be ordered by the admitting provider if indicated.   Thank you for allowing pharmacy to be a part of this patient's care.   Smitty Cords, [facility]  Clinical Pharmacist 06/17/23 11:41 AM

## 2023-06-17 NOTE — Progress Notes (Signed)
HD treatment ended per patient request. AMA signed.

## 2023-06-17 NOTE — Progress Notes (Signed)
RT removed pt from BiPAP and placed pt on 3L [place]. Pt tolerating well at this time, SVS and in no distress.

## 2023-06-17 NOTE — H&P (Signed)
History and Physical    Monique Henderson:[id number] DOB: 1951-04-14 DOA: 06/17/2023  PCP: Fleet Contras, MD  Patient coming from: Home  I have personally briefly reviewed patient's old medical records in [facility]  Chief Complaint: Shortness of breath  HPI: Monique Henderson is a 73 y.o. female with medical history significant of ESRD on hemodialysis (TTS), A-fib on Eliquis, hypertension, hyperlipidemia, COPD, history of right breast cancer status postlumpectomy in 2014, GERD, hepatitis C treated in 2018, moderate protein calorie malnutrition, history of stroke with right-sided deficit, chronic lower back pain status post lumbar spinal fusion surgery in 2010, and recent hospitalization from 06/02/2023 through 06/06/2023 for acute hypoxic respite failure secondary to pneumonia during which she was treated with IV Unasyn and transition to Augmentin and discharged on that and also had hypertensive emergency, yet again presented to ER with complaint of shortness of breath which she says that started last night, getting worse with laying flat.  She has been having cough since last admission.  Denies bringing up any phlegm.  Denies any fever, chills, sweating, nausea, vomiting or any other complaint.  Denies any recent travel or sick contact.  She also says that she has remained compliant with the medications and with the dialysis with the last dialysis on Tuesday, 06/15/2023.  When I saw her, she was on BiPAP, she wanted me to take her off of the BiPAP, I called the nurse and she was taken off of the BiPAP and actually felt better and she was fully alert and oriented and able to provide full history.  ED Course: Upon arrival to ED, she was significantly hypertensive, mildly hypoxic medical requiring 3 L of oxygen but she was still tachypneic so she was started on BiPAP by ED physician.  Afebrile.  Chest x-ray showed interstitial pulm edema and mild right basilar opacity which could be atelectasis or  pneumonia.  No leukocytosis on CBC.  BMP pending at the time I was called.  She appeared fluid overloaded so nephrology was contacted for urgent dialysis.  Patient received cefepime in the ED for presumed pneumonia and 1 dose of hydralazine IV for hypertension but blood pressure did not improve so she was started on nitroglycerin drip and eventually hospitalist were called for further admission and management.  However I do see that her BMP is back and she has mild hyperkalemia 5.3.  She already has dialysis orders.  Review of Systems: As per HPI otherwise negative.    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 Chevis Pretty; S/P re-excision of superior and inferior margins 10/27/2012.    Dissection of aorta (HCC) 04/03/2019   ESRD on hemodialysis (HCC) 05/29/2019   Essential hypertension 09/16/2006   GERD  09/16/2006   Hepatitis C    treated and RNA confirmed not detectable 01/2017   Insomnia 03/14/2015   Malnutrition of moderate degree 05/19/2019   Non compliance with medical treatment 12/04/2017   Normocytic anemia    With thrombocytosis   Osteoarthritis    Right ureteral stone 2002   S/P lumbar spinal fusion 01/18/2014    S/P lumbar decompressive laminectomy, fusion, and plating for lumbar spinal stenosis on 05/27/2009 by Dr. Tia Alert.  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 Tia Alert.     Stroke (cerebrum) (HCC)    Stroke (HCC) 02/23/2022   SVT (supraventricular tachycardia) (HCC) 08/28/2019   Tobacco use disorder 04/19/2009   Uterine fibroid    Wears dentures    top    Past Surgical History:  Procedure Laterality Date   ANTERIOR LAT LUMBAR FUSION N/A 01/18/2014   Procedure: ANTERIOR LATERAL LUMBAR FUSION LUMBAR TWO-THREE;  Surgeon: Tia Alert, MD;  Location: [facility];  Service: Neurosurgery;  Laterality: N/A;   ANTERIOR LUMBAR FUSION  01/18/2014   AV FISTULA PLACEMENT Left 04/20/2019   Procedure: ARTERIOVENOUS (AV) FISTULA CREATION;  Surgeon: Maeola Harman, MD;  Location: [facility] OR;  Service: Vascular;  Laterality: Left;   BACK SURGERY     BREAST LUMPECTOMY Left 01/2003   BREAST LUMPECTOMY Right 2014   BREAST LUMPECTOMY WITH NEEDLE LOCALIZATION AND AXILLARY SENTINEL LYMPH NODE BX Right 10/13/2012   Procedure: BREAST LUMPECTOMY WITH NEEDLE LOCALIZATION;  Surgeon: Robyne Askew, MD;  Location: [facility];  Service: General;  Laterality: Right;  Right breast wire localized lumpectomy   DIALYSIS/PERMA CATHETER INSERTION N/A 05/28/2023   Procedure: DIALYSIS/PERMA CATHETER INSERTION;  Surgeon: Ethelene Hal, MD;  Location: [facility];  Service: Cardiovascular;  Laterality: N/A;   INSERTION OF DIALYSIS CATHETER Right 04/20/2019   Procedure: INSERTION OF DIALYSIS CATHETER, right internal jugular;  Surgeon: Maeola Harman, MD;  Location: [facility] OR;  Service: Vascular;  Laterality: Right;   INSERTION OF DIALYSIS CATHETER Right 09/24/2020   Procedure: INSERTION OF  TUNNELED DIALYSIS CATHETER;  Surgeon: Larina Earthly, MD;  Location: [facility] OR;  Service: Vascular;  Laterality: Right;   IR FLUORO GUIDE CV LINE RIGHT  09/22/2020   IR THORACENTESIS ASP PLEURAL SPACE W/IMG GUIDE  05/19/2019   IR US GUIDE VASC ACCESS LEFT  09/22/2020   IR US GUIDE VASC ACCESS RIGHT  09/22/2020   IR VENOCAVAGRAM SVC  09/22/2020   LAMINECTOMY  05/27/2009   Lumbar decompressive laminectomy, fusion and plating for lumbar spinal stensosis   LIGATION OF ARTERIOVENOUS  FISTULA Left 09/24/2020   Procedure: LIGATION OF LEFT ARM ARTERIOVENOUS  FISTULA;  Surgeon: Larina Earthly, MD;  Location: [facility] OR;  Service: Vascular;  Laterality: Left;   LUMBAR LAMINECTOMY/DECOMPRESSION MICRODISCECTOMY Left 03/23/2013   Procedure: LUMBAR LAMINECTOMY/DECOMPRESSION MICRODISCECTOMY 1 LEVEL;  Surgeon: Tia Alert, MD;  Location: [facility];  Service: Neurosurgery;  Laterality: Left;  LUMBAR LAMINECTOMY/DECOMPRESSION MICRODISCECTOMY 1 LEVEL   MASTECTOMY, PARTIAL Left 02/26/2003   ; S/P re-excision of cranial and lateral margins 04/19/2003.    RE-EXCISION OF BREAST CANCER,SUPERIOR MARGINS Right 10/27/2012   Procedure: RE-EXCISION OF BREAST CANCER,SUPERIOR and inferior MARGINS;  Surgeon: Robyne Askew, MD;  Location: [facility];  Service: General;  Laterality: Right;   RE-EXCISION OF BREAST  LUMPECTOMY Left 04/2003   TEE WITHOUT CARDIOVERSION N/A 04/04/2019   Procedure: Transesophageal Echocardiogram (Tee);  Surgeon: Linden Dolin, MD;  Location: [facility] OR;  Service: Open Heart Surgery;  Laterality: N/A;   THORACIC AORTIC ANEURYSM REPAIR N/A 04/04/2019   Procedure: THORACIC ASCENDING ANEURYSM REPAIR (AAA)  USING 28 MM X 30 CM HEMASHIELD PLATINUM VASCULAR GRAFT;  Surgeon: Linden Dolin, MD;  Location: [facility] OR;  Service: Open Heart Surgery;  Laterality: N/A;     reports that she quit smoking about 3 years ago. Her smoking use included cigarettes. She started smoking about 47 years ago. She has a 11 pack-year smoking  history. She has never used smokeless tobacco. She reports that she does not currently use alcohol after a past usage of about 2.0 standard drinks of alcohol per week. She reports that she does not currently use drugs after having used the following drugs: Cocaine.  Allergies  Allergen Reactions   Shrimp [Shellfish Allergy] Shortness Of Breath   Bactroban [Mupirocin] Other (See Comments)    "Sores in nose"   Hydrocodone Itching, Nausea Only and Nausea And Vomiting   Chlorhexidine Gluconate Itching   Tylenol [Acetaminophen] Itching    Takes Percocet at home 05/06/23   Zestril [Lisinopril] Cough    Family History  Problem Relation Age of Onset   Colon cancer Mother 49   Hypertension Mother    Diabetes Sister 15   Hypertension Sister    Diabetes Brother    Hypertension Brother    Diabetes Brother    Hypertension Brother    Kidney disease Son        s/p renal transplant   Hypertension Son    Diabetes Son    Multiple sclerosis Son    Bone cancer Sister 42   Breast cancer Neg Hx    Cervical cancer Neg Hx     Prior to Admission medications   Medication Sig Start Date End Date Taking? Authorizing Provider  albuterol (PROVENTIL) (2.5 MG/3ML) 0.083% nebulizer solution Take 2.5 mg by nebulization every 6 (six) hours as needed for wheezing or shortness of breath.    [provider]  albuterol (VENTOLIN HFA) 108 (90 Base) MCG/ACT inhaler Inhale 2 puffs into the lungs every 6 (six) hours as needed for wheezing or shortness of breath. 07/30/20   Janeece Agee, NP  amLODipine (NORVASC) 10 MG tablet Take 1 tablet (10 mg total) by mouth daily. 03/06/22   Setzer, Lynnell Jude, PA-C  apixaban (ELIQUIS) 2.5 MG TABS tablet Take 1 tablet (2.5 mg total) by mouth 2 (two) times daily. 03/06/22   Setzer, Lynnell Jude, PA-C  carvedilol (COREG) 25 MG tablet Take 1 tablet (25 mg total) by mouth 2 (two) times daily with a meal. 05/01/22 06/03/23  Ghimire, Werner Lean, MD  Cholecalciferol (VITAMIN D-3 PO) Take 1  capsule by mouth daily.    [provider]  cloNIDine (CATAPRES) 0.1 MG tablet Take 1 tablet (0.1 mg total) by mouth 2 (two) times daily. 12/18/22   Corky Crafts, MD  hydrALAZINE (APRESOLINE) 100 MG tablet Take 1 tablet (100 mg total) by mouth every 8 (eight) hours. 12/18/22   Corky Crafts, MD  isosorbide mononitrate (IMDUR) 30 MG 24 hr tablet TAKE 1 TABLET (30MG ) ON DIALYSIS DAYS AND 2 TABLETS (60 MG) ON NON DIALYSIS DAYS. 01/12/23   Corky Crafts, MD  naloxone [facility]) nasal spray 4 mg/0.1 mL 1 spray as directed. 02/19/22   [provider]  oxyCODONE-acetaminophen (PERCOCET) 10-325 MG tablet  Take 1 tablet by mouth in the morning and at bedtime. 03/12/22   [provider]  pantoprazole (PROTONIX) 40 MG tablet TAKE 1 TABLET (40 MG TOTAL) BY MOUTH 2 (TWO) TIMES DAILY. 05/13/23   Georganna Skeans, MD  rosuvastatin (CRESTOR) 10 MG tablet TAKE 1 TABLET (10 MG TOTAL) BY MOUTH DAILY FOR CHOLESTEROL 08/07/22   Georganna Skeans, MD  senna-docusate (SENOKOT-S) 8.6-50 MG tablet Take 1 tablet by mouth 2 (two) times daily between meals as needed for mild constipation. 08/14/22   Almon Hercules, MD  sevelamer carbonate (RENVELA) 2.4 g PACK Take 2.4 g by mouth 3 (three) times daily with meals. 03/06/22   Setzer, Lynnell Jude, PA-C  traZODone (DESYREL) 50 MG tablet Take 25-50 mg by mouth at bedtime as needed for sleep. 03/19/23   [provider]    Physical Exam: Vitals:   06/17/23 1030 06/17/23 1113 06/17/23 1120 06/17/23 1219  BP: (!) 218/126 (!) 186/120  (!) 208/127  Pulse:  (!) 114 (!) 116 (!) 116  Resp: (!) 29 (!) 21 (!) 35 (!) 35  Temp:  98 F (36.7 C)  98.1 F (36.7 C)  TempSrc:  Oral  Oral  SpO2:  100% 100% 100%  Weight:      Height:        Constitutional: NAD, calm, comfortable Vitals:   06/17/23 1030 06/17/23 1113 06/17/23 1120 06/17/23 1219  BP: (!) 218/126 (!) 186/120  (!) 208/127  Pulse:  (!) 114 (!) 116 (!) 116  Resp: (!) 29 (!) 21 (!) 35 (!)  35  Temp:  98 F (36.7 C)  98.1 F (36.7 C)  TempSrc:  Oral  Oral  SpO2:  100% 100% 100%  Weight:      Height:       Eyes: PERRL, lids and conjunctivae normal ENMT: Mucous membranes are moist. Posterior pharynx clear of any exudate or lesions.Normal dentition.  Neck: normal, supple, no masses, no thyromegaly Respiratory: Tachypneic but clear to auscultation. Cardiovascular: Regular rate and rhythm with sinus tachycardia, no murmurs / rubs / gallops. No extremity edema. 2+ pedal pulses. No carotid bruits.  Abdomen: no tenderness, no masses palpated. No hepatosplenomegaly. Bowel sounds positive.  Musculoskeletal: no clubbing / cyanosis. No joint deformity upper and lower extremities. Good ROM, no contractures. Normal muscle tone.  Skin: no rashes, lesions, ulcers. No induration Neurologic: CN 2-12 grossly intact. Sensation intact, DTR normal.  Right hemiparesis.  Labs on Admission: I have personally reviewed following labs and imaging studies  CBC: Recent Labs  Lab 06/17/23 1002  WBC 7.6  HGB 10.7*  HCT 34.5*  MCV 93.2  PLT 219   Basic Metabolic Panel: Recent Labs  Lab 06/17/23 1134  NA 139  K 5.3*  CL 96*  CO2 25  GLUCOSE 94  BUN 57*  CREATININE 8.86*  CALCIUM 9.1   GFR: Estimated Creatinine Clearance: 4.5 mL/min (A) (by C-G formula based on SCr of 8.86 mg/dL (H)). Liver Function Tests: No results for input(s): "AST", "ALT", "ALKPHOS", "BILITOT", "PROT", "ALBUMIN" in the last 168 hours. No results for input(s): "LIPASE", "AMYLASE" in the last 168 hours. No results for input(s): "AMMONIA" in the last 168 hours. Coagulation Profile: No results for input(s): "INR", "PROTIME" in the last 168 hours. Cardiac Enzymes: No results for input(s): "CKTOTAL", "CKMB", "CKMBINDEX", "TROPONINI" in the last 168 hours. BNP (last 3 results) No results for input(s): "PROBNP" in the last 8760 hours. HbA1C: No results for input(s): "HGBA1C" in the last 72 hours. CBG: No results  for input(s): "GLUCAP" in the last 168 hours. Lipid Profile: No results for input(s): "CHOL", "HDL", "LDLCALC", "TRIG", "CHOLHDL", "LDLDIRECT" in the last 72 hours. Thyroid Function Tests: No results for input(s): "TSH", "T4TOTAL", "FREET4", "T3FREE", "THYROIDAB" in the last 72 hours. Anemia Panel: No results for input(s): "VITAMINB12", "FOLATE", "FERRITIN", "TIBC", "IRON", "RETICCTPCT" in the last 72 hours. Urine analysis:    Component Value Date/Time   COLORURINE AMBER (A) 12/27/2021 1840   APPEARANCEUR TURBID (A) 12/27/2021 1840   LABSPEC 1.013 12/27/2021 1840   PHURINE 8.0 12/27/2021 1840   GLUCOSEU NEGATIVE 12/27/2021 1840   GLUCOSEU NEG mg/dL 63/87/5643 3295   HGBUR MODERATE (A) 12/27/2021 1840   BILIRUBINUR NEGATIVE 12/27/2021 1840   BILIRUBINUR negative 05/04/2018 0910   KETONESUR NEGATIVE 12/27/2021 1840   PROTEINUR >=300 (A) 12/27/2021 1840   UROBILINOGEN 0.2 05/04/2018 0910   UROBILINOGEN 1 02/14/2014 0946   NITRITE NEGATIVE 12/27/2021 1840   LEUKOCYTESUR LARGE (A) 12/27/2021 1840    Radiological Exams on Admission: DG Chest Portable 1 View Result Date: 06/17/2023 CLINICAL DATA:  Chest pain and shortness of breath. EXAM: PORTABLE CHEST 1 VIEW COMPARISON:  Chest x-ray dated June 02, 2023. FINDINGS: Unchanged tunneled right internal jugular dialysis catheter. Stable cardiomegaly. New diffuse interstitial thickening. Mild right basilar opacity. No pleural effusion or pneumothorax. No acute osseous abnormality. IMPRESSION: 1. New interstitial pulmonary edema. 2. Mild right basilar opacity could reflect atelectasis or pneumonia. Electronically Signed   By: Obie Dredge M.D.   On: 06/17/2023 10:55    EKG: Independently reviewed.  Sinus rhythm with left atrial enlargement.  Assessment/Plan Active Problems:   Fluid overload   Hypertensive emergency   History of CVA (cerebrovascular accident)   Paroxysmal atrial fibrillation (HCC)   Acute on chronic combined systolic  and diastolic CHF (congestive heart failure) (HCC)   Chronic anticoagulation   Tobacco use disorder   GERD   Chronic obstructive lung disease (HCC)   ESRD (end stage renal disease) (HCC)   Dyslipidemia   Acute respiratory failure with hypoxia (HCC)   Tricuspid valve insufficiency   History of aortic dissection   History of cocaine abuse (HCC)   Acute hypoxic respiratory failure secondary to acute on chronic combined systolic and diastolic congestive heart failure/volume overload, POA: Chest x-ray with vascular congestion, this is despite of her not missing the dialysis.  Perhaps her dry weight needs to be adjusted.  Nephrology is consulted and she is prioritized for urgent dialysis today.  Hopefully after that, she will improve.  Continue BiPAP.  Presumed Healthcare associated pneumonia, POA: Although chest x-ray does show possibility of infiltrate however clinically, she does not appear to have pneumonia, afebrile with no leukocytosis.  Procalcitonin is unreliable in ESRD and dialysis patient however I will check regardless to compare with the previous records available.  She received cefepime in the ED.  I will start her on Rocephin and Zithromax however will have low threshold to discontinue antibiotics if she improved significantly after hemodialysis and remains afebrile with no leukocytosis by tomorrow.  Hypertensive emergency: Blood pressure significantly elevated despite of receiving hydralazine in the ED, continue nitroglycerin and wean as able to.  Hopefully after dialysis, she should improve.  Resume all oral antihypertensives from home as well.  ESRD on HD/hyperkalemia: Nephrology on board and dialysis today.  Paroxysmal A-fib: In sinus rhythm currently.  Resume beta-blocker and Eliquis.  History of stroke with right-sided hemiparesis/hyperlipidemia: Resume statin and Eliquis.  History of ascending aortic dissection status post endovascular repair.  History of  COPD documented:  Does not appear to be in acute exacerbation.  Does not appear to be taking bronchodilators at home either.  DVT prophylaxis: Eliquis Code Status: Full code Family Communication: None present at bedside.   Disposition Plan: Discharge back to home based on the clinical progress and next 1 to 2 days Consults called: Nephrology  Hughie Closs MD [facility]  *Please note that this is a verbal dictation therefore any spelling or grammatical errors are due to the "[employer] One" system interpretation.  Please page via Amion and do not message via secure chat for urgent patient care matters. Secure chat can be used for non urgent patient care matters. 06/17/2023, 12:31 PM  To contact the attending provider between 7A-7P or the covering provider during after hours 7P-7A, please log into the web site [email or web address]

## 2023-06-17 NOTE — ED Notes (Addendum)
Phlebotomy at bedside.  BMP will be recollected by phlebotomy as well.

## 2023-06-17 NOTE — ED Notes (Signed)
Phlebotomy to collect two sets of blood cultures ordered per DO Young.

## 2023-06-17 NOTE — ED Provider Notes (Signed)
[place] EMERGENCY DEPARTMENT AT [facility] Provider Note   CSN: [id number] Arrival date & time: 06/17/23  9563     History  Chief Complaint  Patient presents with   Shortness of Breath    Monique Henderson is a 73 y.o. female.  Prevented for [facility]. Sxs stated last night with progression over the evening. Unable to lay flat. Complains of chest tightness. Feels somewhat better after oxygen given by EMS. ESRD, last dialysis Tuesday for full session.    Shortness of Breath      Home Medications Prior to Admission medications   Medication Sig Start Date End Date Taking? Authorizing Provider  albuterol (PROVENTIL) (2.5 MG/3ML) 0.083% nebulizer solution Take 2.5 mg by nebulization every 6 (six) hours as needed for wheezing or shortness of breath.    [provider]  albuterol (VENTOLIN HFA) 108 (90 Base) MCG/ACT inhaler Inhale 2 puffs into the lungs every 6 (six) hours as needed for wheezing or shortness of breath. 07/30/20   Janeece Agee, NP  amLODipine (NORVASC) 10 MG tablet Take 1 tablet (10 mg total) by mouth daily. 03/06/22   Setzer, Lynnell Jude, PA-C  apixaban (ELIQUIS) 2.5 MG TABS tablet Take 1 tablet (2.5 mg total) by mouth 2 (two) times daily. 03/06/22   Setzer, Lynnell Jude, PA-C  carvedilol (COREG) 25 MG tablet Take 1 tablet (25 mg total) by mouth 2 (two) times daily with a meal. 05/01/22 06/03/23  Ghimire, Werner Lean, MD  Cholecalciferol (VITAMIN D-3 PO) Take 1 capsule by mouth daily.    [provider]  cloNIDine (CATAPRES) 0.1 MG tablet Take 1 tablet (0.1 mg total) by mouth 2 (two) times daily. 12/18/22   Corky Crafts, MD  hydrALAZINE (APRESOLINE) 100 MG tablet Take 1 tablet (100 mg total) by mouth every 8 (eight) hours. 12/18/22   Corky Crafts, MD  isosorbide mononitrate (IMDUR) 30 MG 24 hr tablet TAKE 1 TABLET (30MG ) ON DIALYSIS DAYS AND 2 TABLETS (60 MG) ON NON DIALYSIS DAYS. 01/12/23   Corky Crafts, MD  naloxone [facility]) nasal  spray 4 mg/0.1 mL 1 spray as directed. 02/19/22   [provider]  oxyCODONE-acetaminophen (PERCOCET) 10-325 MG tablet Take 1 tablet by mouth in the morning and at bedtime. 03/12/22   [provider]  pantoprazole (PROTONIX) 40 MG tablet TAKE 1 TABLET (40 MG TOTAL) BY MOUTH 2 (TWO) TIMES DAILY. 05/13/23   Georganna Skeans, MD  rosuvastatin (CRESTOR) 10 MG tablet TAKE 1 TABLET (10 MG TOTAL) BY MOUTH DAILY FOR CHOLESTEROL 08/07/22   Georganna Skeans, MD  senna-docusate (SENOKOT-S) 8.6-50 MG tablet Take 1 tablet by mouth 2 (two) times daily between meals as needed for mild constipation. 08/14/22   Almon Hercules, MD  sevelamer carbonate (RENVELA) 2.4 g PACK Take 2.4 g by mouth 3 (three) times daily with meals. 03/06/22   Setzer, Lynnell Jude, PA-C  traZODone (DESYREL) 50 MG tablet Take 25-50 mg by mouth at bedtime as needed for sleep. 03/19/23   [provider]      Allergies    Shrimp [shellfish allergy], Bactroban [mupirocin], Hydrocodone, Chlorhexidine gluconate, Tylenol [acetaminophen], and Zestril [lisinopril]    Review of Systems   Review of Systems  Respiratory:  Positive for shortness of breath.     Physical Exam Updated Vital Signs BP (!) 186/120 (BP Location: Left Arm)   Pulse (!) 116   Temp 98 F (36.7 C) (Oral)   Resp (!) 35   Ht 5\' 3"  (1.6 m)  Wt 48.1 kg   SpO2 100%   BMI 18.78 kg/m  Physical Exam  ED Results / Procedures / Treatments   Labs (all labs ordered are listed, but only abnormal results are displayed) Labs Reviewed  CBC - Abnormal; Notable for the following components:      Result Value   RBC 3.70 (*)    Hemoglobin 10.7 (*)    HCT 34.5 (*)    RDW 17.7 (*)    All other components within normal limits  CULTURE, BLOOD (ROUTINE X 2)  CULTURE, BLOOD (ROUTINE X 2)  BRAIN NATRIURETIC PEPTIDE  BASIC METABOLIC PANEL    EKG EKG Interpretation Date/Time:  Thursday June 17 2023 09:54:25 EST Ventricular Rate:  96 PR Interval:  161 QRS  Duration:  140 QT Interval:  400 QTC Calculation: 506 R Axis:   41  Text Interpretation: Sinus rhythm Left atrial enlargement Right bundle branch block Confirmed by Estanislado Pandy [phone]) on 06/17/2023 10:11:31 AM  Radiology DG Chest Portable 1 View Result Date: 06/17/2023 CLINICAL DATA:  Chest pain and shortness of breath. EXAM: PORTABLE CHEST 1 VIEW COMPARISON:  Chest x-ray dated June 02, 2023. FINDINGS: Unchanged tunneled right internal jugular dialysis catheter. Stable cardiomegaly. New diffuse interstitial thickening. Mild right basilar opacity. No pleural effusion or pneumothorax. No acute osseous abnormality. IMPRESSION: 1. New interstitial pulmonary edema. 2. Mild right basilar opacity could reflect atelectasis or pneumonia. Electronically Signed   By: Obie Dredge M.D.   On: 06/17/2023 10:55    Procedures .Critical Care  Performed by: Coral Spikes, DO Authorized by: Coral Spikes, DO   Critical care provider statement:    Critical care time (minutes):  30   Critical care time was exclusive of:  Separately billable procedures and treating other patients   Critical care was necessary to treat or prevent imminent or life-threatening deterioration of the following conditions:  Respiratory failure, circulatory failure and renal failure   Critical care was time spent personally by me on the following activities:  Development of treatment plan with patient or surrogate, discussions with consultants, evaluation of patient's response to treatment, examination of patient, obtaining history from patient or surrogate, ordering and performing treatments and interventions, ordering and review of laboratory studies, ordering and review of radiographic studies, pulse oximetry, re-evaluation of patient's condition and review of old charts   I assumed direction of critical care for this patient from another provider in my specialty: no       Medications Ordered in ED Medications   nitroGLYCERIN 50 mg in dextrose 5 % 250 mL (0.2 mg/mL) infusion (60 mcg/min Intravenous Rate/Dose Change 06/17/23 1141)  ceFEPIme (MAXIPIME) 2 g in sodium chloride 0.9 % 100 mL IVPB (has no administration in time range)  hydrALAZINE (APRESOLINE) injection 20 mg (20 mg Intravenous Given 06/17/23 1014)    ED Course/ Medical Decision Making/ A&P Clinical Course as of 06/17/23 1152  Thu Jun 17, 2023  1059 DG Chest Portable 1 View IMPRESSION: 1. New interstitial pulmonary edema. 2. Mild right basilar opacity could reflect atelectasis or pneumonia.   [TY]  1108 Patient reevaluated.  Has had worsening respiratory status with increased work of breathing.  Continues to be quite hypertensive despite antihypertensives.  Chest x-ray with interstitial edema.  Will start BiPAP and nitro drip.  Nephrology consulted as well.  [TY]  1112 Discussed with nephrology for urgent dialysis; they will prioritize patient and dialyze as soon as possible.  [TY]    Clinical Course User Index [TY] Waverley Krempasky,  Harmon Dun, DO                                 Medical Decision Making This is a 73 year old female with ESRD on dialysis Tuesday Thursday Saturday recent admission per chart review for pneumonia presenting to the emergency department for shortness of breath.  Seemingly acute onset.  Borderline saturation per EMS report and was placed on nasal cannula for comfort.  She is hypertensive on arrival with blood pressure in the 200s systolic.  Initially maintaining oxygen saturation 98% on 3 L, but did have moderate respiratory failure secondary to her increased work of breathing.  Trial blood pressure control, but patient resistant.  Chest x-ray with pulmonary edema as well as right lower lobe infiltrate.  No fever, but is tachycardic.  She has no leukocytosis as well.  However was recently admitted and hospitalized.  Will get blood cultures.  Cefepime to cover for hospital acquired pneumonia.  Nephrology consulted planning for  dialysis.  Given patient's acute respiratory failure likely secondary to volume overload/hypertension and resultant pulmonary edema we will plan for admission to ensure resolution of her symptoms and continue antibiotics.  Amount and/or Complexity of Data Reviewed Labs: ordered. Radiology: ordered. Decision-making details documented in ED Course. ECG/medicine tests: independent interpretation performed. Decision-making details documented in ED Course.    Details: Sinus tachycardia and my independent interpretation.  No ST segment changes to indicate ischemia. Discussion of management or test interpretation with external provider(s): Nephrology. Hospitalist.   Risk Prescription drug management. Decision regarding hospitalization.         Final Clinical Impression(s) / ED Diagnoses Final diagnoses:  Acute respiratory failure with hypoxia (HCC)  ESRD (end stage renal disease) [facility])    Rx / DC Orders ED Discharge Orders     None         Coral Spikes, DO 06/17/23 1152

## 2023-06-17 NOTE — ED Notes (Signed)
CCMD called and PT put in system but no cords available a this time.

## 2023-06-17 NOTE — Procedures (Signed)
Received patient in bed to unit.  Alert and oriented.  Informed consent signed and in chart.   TX duration: 2 hours 51 min  Patient signed off early.  Transported back to ED.  Alert, without acute distress.  Hand-off given to patient's nurse.   Access used: rcvc Access issues: none  Total UF removed: 3400 ml Medication(s) given: ativan and hydralazine   Lu Duffel, RN Kid[facility] Dialysis Unit

## 2023-06-17 NOTE — Consult Note (Signed)
[facility] Renal Consultation Note    Indication for Consultation:  Management of ESRD/hemodialysis; anemia, hypertension/volume and secondary hyperparathyroidism   HPI: Monique Henderson is a 73 y.o. female with ESRD on HD TTS. Presented from home with dyspnea, hypoxia, severe hypertension. Recent [facility] admission on 1/2 -06/06/23 with similar presentation  ARF with pneumonia- completed course of antibiotics. Now admitted with acute hypoxic respiratory failure with increasing O2 requirement. Initially on 3L [place], now on BiPAP. CXR shows pulmonary edema/pneumonia. Nephrology consulted for urgent dialysis.   Last dialysis was Tues 1/14. She completed a full treatment and left 0.8 kg over her dry weight. Has not missed treatment in the last week. Due for routine dialysis today.   Seen and examined in the ED. Hypertensive, tachycardic, tachypnea. On BiPAP unable to obtain much history. Will bring up for HD shortly.   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 Chevis Pretty; S/P re-excision of superior and inferior margins 10/27/2012.    Dissection of aorta (HCC) 04/03/2019   ESRD on hemodialysis (HCC) 05/29/2019    Essential hypertension 09/16/2006   GERD 09/16/2006   Hepatitis C    treated and RNA confirmed not detectable 01/2017   Insomnia 03/14/2015   Malnutrition of moderate degree 05/19/2019   Non compliance with medical treatment 12/04/2017   Normocytic anemia    With thrombocytosis   Osteoarthritis    Right ureteral stone 2002   S/P lumbar spinal fusion 01/18/2014   S/P lumbar decompressive laminectomy, fusion, and plating for lumbar spinal stenosis on 05/27/2009 by Dr. Tia Alert.  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 Tia Alert.     Stroke (cerebrum) (HCC)    Stroke (HCC) 02/23/2022   SVT (supraventricular tachycardia) (HCC) 08/28/2019   Tobacco use disorder 04/19/2009   Uterine fibroid    Wears dentures    top   Past Surgical History:  Procedure Laterality Date   ANTERIOR LAT LUMBAR FUSION N/A 01/18/2014   Procedure: ANTERIOR LATERAL LUMBAR FUSION LUMBAR TWO-THREE;  Surgeon: Tia Alert, MD;  Location: [facility];  Service: Neurosurgery;  Laterality: N/A;   ANTERIOR LUMBAR FUSION  01/18/2014   AV FISTULA PLACEMENT Left 04/20/2019   Procedure: ARTERIOVENOUS (AV) FISTULA CREATION;  Surgeon: Maeola Harman, MD;  Location: [facility] OR;  Service: Vascular;  Laterality: Left;   BACK SURGERY     BREAST LUMPECTOMY Left 01/2003   BREAST LUMPECTOMY Right 2014   BREAST LUMPECTOMY WITH NEEDLE LOCALIZATION AND AXILLARY SENTINEL LYMPH NODE BX Right 10/13/2012   Procedure: BREAST LUMPECTOMY WITH NEEDLE  LOCALIZATION;  Surgeon: Robyne Askew, MD;  Location: [facility];  Service: General;  Laterality: Right;  Right breast wire localized lumpectomy   DIALYSIS/PERMA CATHETER INSERTION N/A 05/28/2023   Procedure: DIALYSIS/PERMA CATHETER INSERTION;  Surgeon: Ethelene Hal, MD;   Location: [facility] INVASIVE CV LAB;  Service: Cardiovascular;  Laterality: N/A;   INSERTION OF DIALYSIS CATHETER Right 04/20/2019   Procedure: INSERTION OF DIALYSIS CATHETER, right internal jugular;  Surgeon: Maeola Harman, MD;  Location: [facility] OR;  Service: Vascular;  Laterality: Right;   INSERTION OF DIALYSIS CATHETER Right 09/24/2020   Procedure: INSERTION OF TUNNELED DIALYSIS CATHETER;  Surgeon: Larina Earthly, MD;  Location: [facility] OR;  Service: Vascular;  Laterality: Right;   IR FLUORO GUIDE CV LINE RIGHT  09/22/2020   IR THORACENTESIS ASP PLEURAL SPACE W/IMG GUIDE  05/19/2019   IR US GUIDE VASC ACCESS LEFT  09/22/2020   IR US GUIDE VASC ACCESS RIGHT  09/22/2020   IR VENOCAVAGRAM SVC  09/22/2020   LAMINECTOMY  05/27/2009   Lumbar decompressive laminectomy, fusion and plating for lumbar spinal stensosis   LIGATION OF ARTERIOVENOUS  FISTULA Left 09/24/2020   Procedure: LIGATION OF LEFT ARM ARTERIOVENOUS  FISTULA;  Surgeon: Larina Earthly, MD;  Location: [facility] OR;  Service: Vascular;  Laterality: Left;   LUMBAR LAMINECTOMY/DECOMPRESSION MICRODISCECTOMY Left 03/23/2013   Procedure: LUMBAR LAMINECTOMY/DECOMPRESSION MICRODISCECTOMY 1 LEVEL;  Surgeon: Tia Alert, MD;  Location: [facility];  Service: Neurosurgery;  Laterality: Left;  LUMBAR LAMINECTOMY/DECOMPRESSION MICRODISCECTOMY 1 LEVEL   MASTECTOMY, PARTIAL Left 02/26/2003   ; S/P re-excision of cranial and lateral margins 04/19/2003.    RE-EXCISION OF BREAST CANCER,SUPERIOR MARGINS Right 10/27/2012   Procedure: RE-EXCISION OF BREAST CANCER,SUPERIOR and inferior MARGINS;  Surgeon: Robyne Askew, MD;  Location: [facility] OR;  Service: General;  Laterality: Right;   RE-EXCISION OF BREAST LUMPECTOMY Left 04/2003   TEE WITHOUT CARDIOVERSION N/A 04/04/2019   Procedure: Transesophageal Echocardiogram (Tee);  Surgeon: Linden Dolin, MD;  Location: [facility] OR;  Service: Open Heart Surgery;  Laterality: N/A;   THORACIC AORTIC ANEURYSM REPAIR N/A 04/04/2019    Procedure: THORACIC ASCENDING ANEURYSM REPAIR (AAA)  USING 28 MM X 30 CM HEMASHIELD PLATINUM VASCULAR GRAFT;  Surgeon: Linden Dolin, MD;  Location: [facility] OR;  Service: Open Heart Surgery;  Laterality: N/A;   Family History  Problem Relation Age of Onset   Colon cancer Mother 80   Hypertension Mother    Diabetes Sister 81   Hypertension Sister    Diabetes Brother    Hypertension Brother    Diabetes Brother    Hypertension Brother    Kidney disease Son        s/p renal transplant   Hypertension Son    Diabetes Son    Multiple sclerosis Son    Bone cancer Sister 38   Breast cancer Neg Hx    Cervical cancer Neg Hx    Social History:  reports that she quit smoking about 3 years ago. Her smoking use included cigarettes. She started smoking about 47 years ago. She has a 11 pack-year smoking history. She has never used smokeless tobacco. She reports that she does not currently use alcohol after a past usage of about 2.0 standard drinks of alcohol per week. She reports that she does not currently use drugs after having used the following drugs: Cocaine. Allergies  Allergen Reactions   Shrimp [Shellfish Allergy] Shortness Of Breath   Bactroban [Mupirocin] Other (See Comments)    "  Sores in nose"   Hydrocodone Itching, Nausea Only and Nausea And Vomiting   Chlorhexidine Gluconate Itching   Tylenol [Acetaminophen] Itching    Takes Percocet at home 05/06/23   Zestril [Lisinopril] Cough   Prior to Admission medications   Medication Sig Start Date End Date Taking? Authorizing Provider  albuterol (PROVENTIL) (2.5 MG/3ML) 0.083% nebulizer solution Take 2.5 mg by nebulization every 6 (six) hours as needed for wheezing or shortness of breath.    [provider]  albuterol (VENTOLIN HFA) 108 (90 Base) MCG/ACT inhaler Inhale 2 puffs into the lungs every 6 (six) hours as needed for wheezing or shortness of breath. 07/30/20   Janeece Agee, NP  amLODipine (NORVASC) 10 MG tablet Take 1 tablet  (10 mg total) by mouth daily. 03/06/22   Setzer, Lynnell Jude, PA-C  apixaban (ELIQUIS) 2.5 MG TABS tablet Take 1 tablet (2.5 mg total) by mouth 2 (two) times daily. 03/06/22   Setzer, Lynnell Jude, PA-C  carvedilol (COREG) 25 MG tablet Take 1 tablet (25 mg total) by mouth 2 (two) times daily with a meal. 05/01/22 06/03/23  Ghimire, Werner Lean, MD  Cholecalciferol (VITAMIN D-3 PO) Take 1 capsule by mouth daily.    [provider]  cloNIDine (CATAPRES) 0.1 MG tablet Take 1 tablet (0.1 mg total) by mouth 2 (two) times daily. 12/18/22   Corky Crafts, MD  hydrALAZINE (APRESOLINE) 100 MG tablet Take 1 tablet (100 mg total) by mouth every 8 (eight) hours. 12/18/22   Corky Crafts, MD  isosorbide mononitrate (IMDUR) 30 MG 24 hr tablet TAKE 1 TABLET (30MG ) ON DIALYSIS DAYS AND 2 TABLETS (60 MG) ON NON DIALYSIS DAYS. 01/12/23   Corky Crafts, MD  naloxone [facility]) nasal spray 4 mg/0.1 mL 1 spray as directed. 02/19/22   [provider]  oxyCODONE-acetaminophen (PERCOCET) 10-325 MG tablet Take 1 tablet by mouth in the morning and at bedtime. 03/12/22   [provider]  pantoprazole (PROTONIX) 40 MG tablet TAKE 1 TABLET (40 MG TOTAL) BY MOUTH 2 (TWO) TIMES DAILY. 05/13/23   Georganna Skeans, MD  rosuvastatin (CRESTOR) 10 MG tablet TAKE 1 TABLET (10 MG TOTAL) BY MOUTH DAILY FOR CHOLESTEROL 08/07/22   Georganna Skeans, MD  senna-docusate (SENOKOT-S) 8.6-50 MG tablet Take 1 tablet by mouth 2 (two) times daily between meals as needed for mild constipation. 08/14/22   Almon Hercules, MD  sevelamer carbonate (RENVELA) 2.4 g PACK Take 2.4 g by mouth 3 (three) times daily with meals. 03/06/22   Setzer, Lynnell Jude, PA-C  traZODone (DESYREL) 50 MG tablet Take 25-50 mg by mouth at bedtime as needed for sleep. 03/19/23   [provider]   Current Facility-Administered Medications  Medication Dose Route Frequency Provider Last Rate Last Admin   alteplase (CATHFLO ACTIVASE) injection 2 mg  2 mg  Intracatheter Once PRN Tomasa Blase, PA-C       anticoagulant sodium citrate solution 5 mL  5 mL Intracatheter PRN Harold Moncus, Megan Mans, PA-C       ceFEPIme (MAXIPIME) 2 g in sodium chloride 0.9 % 100 mL IVPB  2 g Intravenous Once Young, Travis J, DO       heparin injection 1,000 Units  1,000 Units Intracatheter PRN Tomasa Blase, PA-C       lidocaine (PF) (XYLOCAINE) 1 % injection 5 mL  5 mL Intradermal PRN Tomasa Blase, PA-C       lidocaine-prilocaine (EMLA) cream 1 Application  1 Application Topical PRN Lucky Cowboy  Delorise Shiner, PA-C       nitroGLYCERIN 50 mg in dextrose 5 % 250 mL (0.2 mg/mL) infusion  0-200 mcg/min Intravenous Continuous Coral Spikes, DO 19.5 mL/hr at 06/17/23 1208 65 mcg/min at 06/17/23 1208   pentafluoroprop-tetrafluoroeth (GEBAUERS) aerosol 1 Application  1 Application Topical PRN Tomasa Blase, PA-C       Current Outpatient Medications  Medication Sig Dispense Refill   albuterol (PROVENTIL) (2.5 MG/3ML) 0.083% nebulizer solution Take 2.5 mg by nebulization every 6 (six) hours as needed for wheezing or shortness of breath.     albuterol (VENTOLIN HFA) 108 (90 Base) MCG/ACT inhaler Inhale 2 puffs into the lungs every 6 (six) hours as needed for wheezing or shortness of breath. 8.5 g 0   amLODipine (NORVASC) 10 MG tablet Take 1 tablet (10 mg total) by mouth daily. 30 tablet 0   apixaban (ELIQUIS) 2.5 MG TABS tablet Take 1 tablet (2.5 mg total) by mouth 2 (two) times daily. 60 tablet 0   carvedilol (COREG) 25 MG tablet Take 1 tablet (25 mg total) by mouth 2 (two) times daily with a meal. 60 tablet 1   Cholecalciferol (VITAMIN D-3 PO) Take 1 capsule by mouth daily.     cloNIDine (CATAPRES) 0.1 MG tablet Take 1 tablet (0.1 mg total) by mouth 2 (two) times daily. 60 tablet 11   hydrALAZINE (APRESOLINE) 100 MG tablet Take 1 tablet (100 mg total) by mouth every 8 (eight) hours. 270 tablet 3   isosorbide mononitrate (IMDUR) 30 MG 24 hr tablet TAKE  1 TABLET (30MG ) ON DIALYSIS DAYS AND 2 TABLETS (60 MG) ON NON DIALYSIS DAYS. 45 tablet 10   naloxone (NARCAN) nasal spray 4 mg/0.1 mL 1 spray as directed.     oxyCODONE-acetaminophen (PERCOCET) 10-325 MG tablet Take 1 tablet by mouth in the morning and at bedtime.     pantoprazole (PROTONIX) 40 MG tablet TAKE 1 TABLET (40 MG TOTAL) BY MOUTH 2 (TWO) TIMES DAILY. 60 tablet 1   rosuvastatin (CRESTOR) 10 MG tablet TAKE 1 TABLET (10 MG TOTAL) BY MOUTH DAILY FOR CHOLESTEROL 90 tablet 0   senna-docusate (SENOKOT-S) 8.6-50 MG tablet Take 1 tablet by mouth 2 (two) times daily between meals as needed for mild constipation. 180 tablet 0   sevelamer carbonate (RENVELA) 2.4 g PACK Take 2.4 g by mouth 3 (three) times daily with meals. 90 each 0   traZODone (DESYREL) 50 MG tablet Take 25-50 mg by mouth at bedtime as needed for sleep.       ROS: As per HPI otherwise negative.  Physical Exam: Vitals:   06/17/23 1030 06/17/23 1113 06/17/23 1120 06/17/23 1219  BP: (!) 218/126 (!) 186/120  (!) 208/127  Pulse:  (!) 114 (!) 116 (!) 116  Resp: (!) 29 (!) 21 (!) 35 (!) 35  Temp:  98 F (36.7 C)  98.1 F (36.7 C)  TempSrc:  Oral  Oral  SpO2:  100% 100% 100%  Weight:      Height:         General: Uncomfortable on BiPAP Head: NCAT sclera not icteric MMM Neck: Supple.  Lungs: Diminished at bases  Heart: RRR, no murmur, rub, or gallop  Abdomen: soft non-tender, no masses  Lower extremities:without edema or ischemic changes, no open wounds  Neuro: A & O X 3. Moves all extremities spontaneously. Psych:  Responds to questions appropriately with a normal affect. Dialysis Access: R internal jugular [facility]   Labs: Basic Metabolic Panel: Recent Labs  Lab 06/17/23  1134  NA 139  K 5.3*  CL 96*  CO2 25  GLUCOSE 94  BUN 57*  CREATININE 8.86*  CALCIUM 9.1   Liver Function Tests: No results for input(s): "AST", "ALT", "ALKPHOS", "BILITOT", "PROT", "ALBUMIN" in the last 168 hours. No results for input(s):  "LIPASE", "AMYLASE" in the last 168 hours. No results for input(s): "AMMONIA" in the last 168 hours. CBC: Recent Labs  Lab 06/17/23 1002  WBC 7.6  HGB 10.7*  HCT 34.5*  MCV 93.2  PLT 219   Cardiac Enzymes: No results for input(s): "CKTOTAL", "CKMB", "CKMBINDEX", "TROPONINI" in the last 168 hours. CBG: No results for input(s): "GLUCAP" in the last 168 hours. Iron Studies: No results for input(s): "IRON", "TIBC", "TRANSFERRIN", "FERRITIN" in the last 72 hours. Studies/Results: DG Chest Portable 1 View Result Date: 06/17/2023 CLINICAL DATA:  Chest pain and shortness of breath. EXAM: PORTABLE CHEST 1 VIEW COMPARISON:  Chest x-ray dated June 02, 2023. FINDINGS: Unchanged tunneled right internal jugular dialysis catheter. Stable cardiomegaly. New diffuse interstitial thickening. Mild right basilar opacity. No pleural effusion or pneumothorax. No acute osseous abnormality. IMPRESSION: 1. New interstitial pulmonary edema. 2. Mild right basilar opacity could reflect atelectasis or pneumonia. Electronically Signed   By: Obie Dredge M.D.   On: 06/17/2023 10:55    Dialysis Orders:  AF TTS 3.5H 400/A1.5x 2K/2Ca TDC No heparin Mircera 225 q 2 wks (last 12/10) Hectrol 9 TIW Sensipar 60 TIW  Assessment/Plan:  Acute hypoxic respiratory failure - Requiring BiPAP on admit. Pulm edema + pna on CXR. Plan for urgent HD today   ESRD -  TTS. Has not missed HD. Left 0.8 over EDW. HD today.   Hypertension/volume  - Severe HTN on admit. Getting IV meds. Follow post HD   Anemia  - Hgb 10.7. No ESA needs currently   Metabolic bone disease -  Stable. Continue home meds as able.   Afib - on BB/Eliquis   Tomasa Blase PA-C [facility] 06/17/2023, 12:36 PM

## 2023-06-17 NOTE — ED Triage Notes (Signed)
Pt BIB [facility] due to shortness of breath since last night.  Nausea and vomiting as well.  Pt diagnosed with pnemonia 3 weeks ago and given antibiotics.  Pt does have atrial fibrillation and does take eliquis.  Pt does have dialysis through chest port; normally Tuesday, Thursday and Saturday.  Missed dialysis today.  Hx stroke with right sided deficits.   VS BP 230/120.

## 2023-06-18 ENCOUNTER — Encounter (HOSPITAL_COMMUNITY): Payer: Self-pay | Admitting: Family Medicine

## 2023-06-18 DIAGNOSIS — Z7901 Long term (current) use of anticoagulants: Secondary | ICD-10-CM | POA: Diagnosis not present

## 2023-06-18 DIAGNOSIS — K219 Gastro-esophageal reflux disease without esophagitis: Secondary | ICD-10-CM | POA: Diagnosis present

## 2023-06-18 DIAGNOSIS — J189 Pneumonia, unspecified organism: Secondary | ICD-10-CM | POA: Diagnosis present

## 2023-06-18 DIAGNOSIS — R0602 Shortness of breath: Secondary | ICD-10-CM | POA: Diagnosis present

## 2023-06-18 DIAGNOSIS — I69351 Hemiplegia and hemiparesis following cerebral infarction affecting right dominant side: Secondary | ICD-10-CM | POA: Diagnosis not present

## 2023-06-18 DIAGNOSIS — N186 End stage renal disease: Secondary | ICD-10-CM | POA: Diagnosis present

## 2023-06-18 DIAGNOSIS — R000 Tachycardia, unspecified: Secondary | ICD-10-CM | POA: Diagnosis present

## 2023-06-18 DIAGNOSIS — R9431 Abnormal electrocardiogram [ECG] [EKG]: Secondary | ICD-10-CM | POA: Diagnosis present

## 2023-06-18 DIAGNOSIS — J440 Chronic obstructive pulmonary disease with acute lower respiratory infection: Secondary | ICD-10-CM | POA: Diagnosis present

## 2023-06-18 DIAGNOSIS — N2581 Secondary hyperparathyroidism of renal origin: Secondary | ICD-10-CM | POA: Diagnosis present

## 2023-06-18 DIAGNOSIS — Z923 Personal history of irradiation: Secondary | ICD-10-CM | POA: Diagnosis not present

## 2023-06-18 DIAGNOSIS — E8770 Fluid overload, unspecified: Secondary | ICD-10-CM | POA: Diagnosis not present

## 2023-06-18 DIAGNOSIS — Z992 Dependence on renal dialysis: Secondary | ICD-10-CM | POA: Diagnosis not present

## 2023-06-18 DIAGNOSIS — D631 Anemia in chronic kidney disease: Secondary | ICD-10-CM | POA: Diagnosis present

## 2023-06-18 DIAGNOSIS — I161 Hypertensive emergency: Secondary | ICD-10-CM | POA: Diagnosis present

## 2023-06-18 DIAGNOSIS — E785 Hyperlipidemia, unspecified: Secondary | ICD-10-CM | POA: Diagnosis present

## 2023-06-18 DIAGNOSIS — I480 Paroxysmal atrial fibrillation: Secondary | ICD-10-CM | POA: Diagnosis present

## 2023-06-18 DIAGNOSIS — Y95 Nosocomial condition: Secondary | ICD-10-CM | POA: Diagnosis present

## 2023-06-18 DIAGNOSIS — J9601 Acute respiratory failure with hypoxia: Secondary | ICD-10-CM | POA: Diagnosis present

## 2023-06-18 DIAGNOSIS — Z1152 Encounter for screening for COVID-19: Secondary | ICD-10-CM | POA: Diagnosis not present

## 2023-06-18 DIAGNOSIS — J449 Chronic obstructive pulmonary disease, unspecified: Secondary | ICD-10-CM | POA: Diagnosis not present

## 2023-06-18 DIAGNOSIS — Z91013 Allergy to seafood: Secondary | ICD-10-CM | POA: Diagnosis not present

## 2023-06-18 DIAGNOSIS — Z886 Allergy status to analgesic agent status: Secondary | ICD-10-CM | POA: Diagnosis not present

## 2023-06-18 DIAGNOSIS — E875 Hyperkalemia: Secondary | ICD-10-CM | POA: Diagnosis present

## 2023-06-18 DIAGNOSIS — Z79899 Other long term (current) drug therapy: Secondary | ICD-10-CM | POA: Diagnosis not present

## 2023-06-18 DIAGNOSIS — I5043 Acute on chronic combined systolic (congestive) and diastolic (congestive) heart failure: Secondary | ICD-10-CM | POA: Diagnosis present

## 2023-06-18 DIAGNOSIS — I132 Hypertensive heart and chronic kidney disease with heart failure and with stage 5 chronic kidney disease, or end stage renal disease: Secondary | ICD-10-CM | POA: Diagnosis present

## 2023-06-18 DIAGNOSIS — I4510 Unspecified right bundle-branch block: Secondary | ICD-10-CM | POA: Diagnosis present

## 2023-06-18 LAB — BASIC METABOLIC PANEL
Anion gap: 15 (ref 5–15)
BUN: 32 mg/dL — ABNORMAL HIGH (ref 8–23)
CO2: 25 mmol/L (ref 22–32)
Calcium: 9.3 mg/dL (ref 8.9–10.3)
Chloride: 94 mmol/L — ABNORMAL LOW (ref 98–111)
Creatinine, Ser: 5.77 mg/dL — ABNORMAL HIGH (ref 0.44–1.00)
GFR, Estimated: 7 mL/min — ABNORMAL LOW (ref >60)
Glucose, Bld: 101 mg/dL — ABNORMAL HIGH (ref 70–99)
Potassium: 4.2 mmol/L (ref 3.5–5.1)
Sodium: 134 mmol/L — ABNORMAL LOW (ref 135–145)

## 2023-06-18 LAB — CBC
HCT: 29.6 % — ABNORMAL LOW (ref 36.0–46.0)
Hemoglobin: 9.3 g/dL — ABNORMAL LOW (ref 12.0–15.0)
MCH: 28.9 pg (ref 26.0–34.0)
MCHC: 31.4 g/dL (ref 30.0–36.0)
MCV: 91.9 fL (ref 80.0–100.0)
Platelets: 187 10*3/uL (ref 150–400)
RBC: 3.22 MIL/uL — ABNORMAL LOW (ref 3.87–5.11)
RDW: 17.4 % — ABNORMAL HIGH (ref 11.5–15.5)
WBC: 4.5 10*3/uL (ref 4.0–10.5)
nRBC: 0 % (ref 0.0–0.2)

## 2023-06-18 LAB — STREP PNEUMONIAE URINARY ANTIGEN: Strep Pneumo Urinary Antigen: NEGATIVE

## 2023-06-18 LAB — BRAIN NATRIURETIC PEPTIDE: B Natriuretic Peptide: >4500 pg/mL — ABNORMAL HIGH (ref 0.0–100.0)

## 2023-06-18 LAB — HEPATITIS B SURFACE ANTIGEN: Hepatitis B Surface Ag: NONREACTIVE

## 2023-06-18 MED ORDER — HYDRALAZINE HCL 20 MG/ML IJ SOLN
10.0000 mg | Freq: Four times a day (QID) | INTRAMUSCULAR | Status: DC | PRN
  Administered 2023-06-18: 10 mg via INTRAVENOUS
  Filled 2023-06-18: qty 1

## 2023-06-18 MED ORDER — CLONIDINE HCL 0.1 MG PO TABS
0.1000 mg | ORAL_TABLET | Freq: Three times a day (TID) | ORAL | Status: DC
  Administered 2023-06-18 – 2023-06-19 (×2): 0.1 mg via ORAL
  Filled 2023-06-18 (×2): qty 1

## 2023-06-18 MED ORDER — DARBEPOETIN ALFA 200 MCG/0.4ML IJ SOSY
200.0000 ug | PREFILLED_SYRINGE | Freq: Once | INTRAMUSCULAR | Status: AC
  Administered 2023-06-18: 200 ug via SUBCUTANEOUS
  Filled 2023-06-18: qty 0.4

## 2023-06-18 MED ORDER — CLONIDINE HCL 0.1 MG PO TABS
0.1000 mg | ORAL_TABLET | Freq: Once | ORAL | Status: AC
  Administered 2023-06-18: 0.1 mg via ORAL
  Filled 2023-06-18: qty 1

## 2023-06-18 NOTE — Progress Notes (Signed)
Heart Failure Navigator Progress Note  Assessed for Heart & Vascular TOC clinic readiness.  Patient does not meet criteria due to ESRD on HD.   Megan Boothby, PharmD, BCPS Heart Failure Stewardship Pharmacist Phone [phone]    

## 2023-06-18 NOTE — ED Notes (Signed)
The pts bed is being cleaned

## 2023-06-18 NOTE — Progress Notes (Signed)
PROGRESS NOTE    Monique Henderson  WUJ:[id number] DOB: 11-08-1950 DOA: 06/17/2023 PCP: Fleet Contras, MD   Brief Narrative:  HPI: Monique Henderson is a 73 y.o. female with medical history significant of ESRD on hemodialysis (TTS), A-fib on Eliquis, hypertension, hyperlipidemia, COPD, history of right breast cancer status postlumpectomy in 2014, GERD, hepatitis C treated in 2018, moderate protein calorie malnutrition, history of stroke with right-sided deficit, chronic lower back pain status post lumbar spinal fusion surgery in 2010, and recent hospitalization from 06/02/2023 through 06/06/2023 for acute hypoxic respite failure secondary to pneumonia during which she was treated with IV Unasyn and transition to Augmentin and discharged on that and also had hypertensive emergency, yet again presented to ER with complaint of shortness of breath which she says that started last night, getting worse with laying flat.  She has been having cough since last admission.  Denies bringing up any phlegm.  Denies any fever, chills, sweating, nausea, vomiting or any other complaint.  Denies any recent travel or sick contact.  She also says that she has remained compliant with the medications and with the dialysis with the last dialysis on Tuesday, 06/15/2023.  When I saw her, she was on BiPAP, she wanted me to take her off of the BiPAP, I called the nurse and she was taken off of the BiPAP and actually felt better and she was fully alert and oriented and able to provide full history.   ED Course: Upon arrival to ED, she was significantly hypertensive, mildly hypoxic medical requiring 3 L of oxygen but she was still tachypneic so she was started on BiPAP by ED physician.  Afebrile.  Chest x-ray showed interstitial pulm edema and mild right basilar opacity which could be atelectasis or pneumonia.  No leukocytosis on CBC.  BMP pending at the time I was called.  She appeared fluid overloaded so nephrology was contacted for  urgent dialysis.  Patient received cefepime in the ED for presumed pneumonia and 1 dose of hydralazine IV for hypertension but blood pressure did not improve so she was started on nitroglycerin drip and eventually hospitalist were called for further admission and management.  However I do see that her BMP is back and she has mild hyperkalemia 5.3.  She already has dialysis orders.  Assessment & Plan:   Active Problems:   Fluid overload   Hypertensive emergency   History of CVA (cerebrovascular accident)   Paroxysmal atrial fibrillation (HCC)   Acute on chronic combined systolic and diastolic CHF (congestive heart failure) (HCC)   Chronic anticoagulation   Tobacco use disorder   GERD   Chronic obstructive lung disease (HCC)   ESRD (end stage renal disease) (HCC)   Dyslipidemia   Acute respiratory failure with hypoxia (HCC)   Tricuspid valve insufficiency   History of aortic dissection   History of cocaine abuse (HCC)  Acute hypoxic respiratory failure secondary to acute on chronic combined systolic and diastolic congestive heart failure/volume overload, POA: Chest x-ray with vascular congestion, this is despite of her not missing the dialysis.  Perhaps her dry weight needs to be adjusted.  Patient required BiPAP upon arrival, after dialysis, she was weaned to nasal cannula, this morning she is on 2 L of oxygen which we shall wean to room air as able to.   Healthcare associated pneumonia ruled out: Although chest x-ray does show possibility of infiltrate however clinically, she does not appear to have pneumonia, afebrile with no leukocytosis.  Procalcitonin is unreliable in  ESRD  She received cefepime in the ED and since she was still in respiratory distress so I continued her on Rocephin and Zithromax was contraindicated due to prolonged QTc.  Patient's respiratory status has improved significantly since she received dialysis yesterday, she has remained afebrile with no leukocytosis.   Clinically, she does not appear to have pneumonia so I will discontinue antibiotics and watch for another 24 hours.     Hypertensive emergency: When I saw her this morning, patient's blood pressure was 103/80, she was complaining of headache and she was still on high dose of nitroglycerin.  I called and spoke to the nurse personally and advised her to wean nitroglycerin as soon as possible per pharmacy protocol.  We will continue patient's home medications including amlodipine 10 mg, Coreg 25 mg p.o. twice daily, clonidine 0.1 mg twice daily and hydralazine 100 mg 3 times daily.   ESRD on HD/hyperkalemia: Nephrology on board and she received dialysis yesterday, next dialysis tomorrow.   Paroxysmal A-fib: In sinus rhythm currently.  Continue beta-blocker and Eliquis.   History of stroke with right-sided hemiparesis/hyperlipidemia: Resume statin and Eliquis.   History of ascending aortic dissection status post endovascular repair.   History of COPD documented: Does not appear to be in acute exacerbation.  Does not appear to be taking bronchodilators at home either.  DVT prophylaxis: apixaban (ELIQUIS) tablet 2.5 mg Start: 06/17/23 2230   Code Status: Full Code  Family Communication:  None present at bedside.  Plan of care discussed with patient in length and he/she verbalized understanding and agreed with it.  Status is: Observation The patient will require care spanning > 2 midnights and should be moved to inpatient because: Will need another 24-hour observation since we just took her off of the nitroglycerin drip.   Estimated body mass index is 17.46 kg/m as calculated from the following:   Height as of this encounter: 5\' 3"  (1.6 m).   Weight as of this encounter: 44.7 kg.    Nutritional Assessment: Body mass index is 17.46 kg/m.Marland Kitchen Seen by dietician.  I agree with the assessment and plan as outlined below: Nutrition Status:        . Skin Assessment: I have examined the patient's  skin and I agree with the wound assessment as performed by the wound care RN as outlined below:    Consultants:  Nephrology  Procedures:  None  Antimicrobials:  Anti-infectives (From admission, onward)    Start     Dose/Rate Route Frequency Ordered Stop   06/18/23 1400  cefTRIAXone (ROCEPHIN) 2 g in sodium chloride 0.9 % 100 mL IVPB        2 g 200 mL/hr over 30 Minutes Intravenous Every 24 hours 06/17/23 2300 06/23/23 1359   06/17/23 2230  cefTRIAXone (ROCEPHIN) 2 g in sodium chloride 0.9 % 100 mL IVPB  Status:  Discontinued        2 g 200 mL/hr over 30 Minutes Intravenous Every 24 hours 06/17/23 2222 06/17/23 2300   06/17/23 1145  ceFEPIme (MAXIPIME) 2 g in sodium chloride 0.9 % 100 mL IVPB        2 g 200 mL/hr over 30 Minutes Intravenous  Once 06/17/23 1139 06/17/23 2054         Subjective: Patient seen and examined, still waiting in the ED for the bed.  She has improved significantly.  She is able to speak in full sentences and feels a lot better and denies any complaints other than mild headache which  is secondary to nitroglycerin drip.  Objective: Vitals:   06/18/23 0730 06/18/23 0915 06/18/23 1000 06/18/23 1015  BP: (!) 163/112 129/81 128/82 (!) 131/96  Pulse:      Resp: (!) 22 19 (!) 21 18  Temp:      TempSrc:      SpO2:      Weight:      Height:        Intake/Output Summary (Last 24 hours) at 06/18/2023 1049 Last data filed at 06/17/2023 2156 Gross per 24 hour  Intake 309.75 ml  Output 3400 ml  Net -3090.25 ml   Filed Weights   06/17/23 0948 06/17/23 1459 06/17/23 1832  Weight: 48.1 kg 48.1 kg 44.7 kg    Examination:  General exam: Appears calm and comfortable  Respiratory system: Clear to auscultation. Respiratory effort normal. Cardiovascular system: S1 & S2 heard, RRR. No JVD, murmurs, rubs, gallops or clicks. No pedal edema. Gastrointestinal system: Abdomen is nondistended, soft and nontender. No organomegaly or masses felt. Normal bowel sounds  heard. Central nervous system: Alert and oriented. No focal neurological deficits. Extremities: Symmetric 5 x 5 power. Skin: No rashes, lesions or ulcers Psychiatry: Judgement and insight appear normal. Mood & affect appropriate.    Data Reviewed: I have personally reviewed following labs and imaging studies  CBC: Recent Labs  Lab 06/17/23 1002 06/18/23 0334  WBC 7.6 4.5  HGB 10.7* 9.3*  HCT 34.5* 29.6*  MCV 93.2 91.9  PLT 219 187   Basic Metabolic Panel: Recent Labs  Lab 06/17/23 1134 06/18/23 0334  NA 139 134*  K 5.3* 4.2  CL 96* 94*  CO2 25 25  GLUCOSE 94 101*  BUN 57* 32*  CREATININE 8.86* 5.77*  CALCIUM 9.1 9.3   GFR: Estimated Creatinine Clearance: 6.4 mL/min (A) (by C-G formula based on SCr of 5.77 mg/dL (H)). Liver Function Tests: No results for input(s): "AST", "ALT", "ALKPHOS", "BILITOT", "PROT", "ALBUMIN" in the last 168 hours. No results for input(s): "LIPASE", "AMYLASE" in the last 168 hours. No results for input(s): "AMMONIA" in the last 168 hours. Coagulation Profile: No results for input(s): "INR", "PROTIME" in the last 168 hours. Cardiac Enzymes: No results for input(s): "CKTOTAL", "CKMB", "CKMBINDEX", "TROPONINI" in the last 168 hours. BNP (last 3 results) No results for input(s): "PROBNP" in the last 8760 hours. HbA1C: No results for input(s): "HGBA1C" in the last 72 hours. CBG: No results for input(s): "GLUCAP" in the last 168 hours. Lipid Profile: No results for input(s): "CHOL", "HDL", "LDLCALC", "TRIG", "CHOLHDL", "LDLDIRECT" in the last 72 hours. Thyroid Function Tests: No results for input(s): "TSH", "T4TOTAL", "FREET4", "T3FREE", "THYROIDAB" in the last 72 hours. Anemia Panel: No results for input(s): "VITAMINB12", "FOLATE", "FERRITIN", "TIBC", "IRON", "RETICCTPCT" in the last 72 hours. Sepsis Labs: No results for input(s): "PROCALCITON", "LATICACIDVEN" in the last 168 hours.  Recent Results (from the past 240 hours)  Culture,  blood (routine x 2)     Status: None (Preliminary result)   Collection Time: 06/17/23 11:05 AM   Specimen: BLOOD RIGHT HAND  Result Value Ref Range Status   Specimen Description BLOOD RIGHT HAND  Final   Special Requests   Final    BOTTLES DRAWN AEROBIC ONLY Blood Culture adequate volume   Culture   Final    NO GROWTH < 24 HOURS Performed at [facility], [address], [place], [place] [address]    Report Status PENDING  Incomplete  Culture, blood (routine x 2)     Status:  None (Preliminary result)   Collection Time: 06/17/23 11:10 AM   Specimen: BLOOD LEFT HAND  Result Value Ref Range Status   Specimen Description BLOOD LEFT HAND  Final   Special Requests   Final    BOTTLES DRAWN AEROBIC ONLY Blood Culture results may not be optimal due to an inadequate volume of blood received in culture bottles   Culture   Final    NO GROWTH < 24 HOURS Performed at [facility], [address] [place], [place] [address]    Report Status PENDING  Incomplete  Resp panel by RT-PCR (RSV, Flu A&B, Covid) Anterior Nasal Swab     Status: None   Collection Time: 06/17/23 12:19 PM   Specimen: Anterior Nasal Swab  Result Value Ref Range Status   SARS Coronavirus 2 by RT PCR NEGATIVE NEGATIVE Final   Influenza A by PCR NEGATIVE NEGATIVE Final   Influenza B by PCR NEGATIVE NEGATIVE Final    Comment: (NOTE) The Xpert Xpress SARS-CoV-2/FLU/RSV plus assay is intended as an aid in the diagnosis of influenza from Nasopharyngeal swab specimens and should not be used as a sole basis for treatment. Nasal washings and aspirates are unacceptable for Xpert Xpress SARS-CoV-2/FLU/RSV testing.  Fact Sheet for Patients: BloggerCourse.com  Fact Sheet for Healthcare Providers: SeriousBroker.it  This test is not yet approved or cleared by the [place] FDA and has been authorized for detection and/or diagnosis of SARS-CoV-2 by FDA under an  Emergency Use Authorization (EUA). This EUA will remain in effect (meaning this test can be used) for the duration of the COVID-19 declaration under Section 564(b)(1) of the Act, 21 U.S.C. section 360bbb-3(b)(1), unless the authorization is terminated or revoked.     Resp Syncytial Virus by PCR NEGATIVE NEGATIVE Final    Comment: (NOTE) Fact Sheet for Patients: BloggerCourse.com  Fact Sheet for Healthcare Providers: SeriousBroker.it  This test is not yet approved or cleared by the [place] FDA and has been authorized for detection and/or diagnosis of SARS-CoV-2 by FDA under an Emergency Use Authorization (EUA). This EUA will remain in effect (meaning this test can be used) for the duration of the COVID-19 declaration under Section 564(b)(1) of the Act, 21 U.S.C. section 360bbb-3(b)(1), unless the authorization is terminated or revoked.  Performed at [facility], [address], [place], [place] [address]      Radiology Studies: DG Chest Portable 1 View Result Date: 06/17/2023 CLINICAL DATA:  Chest pain and shortness of breath. EXAM: PORTABLE CHEST 1 VIEW COMPARISON:  Chest x-ray dated June 02, 2023. FINDINGS: Unchanged tunneled right internal jugular dialysis catheter. Stable cardiomegaly. New diffuse interstitial thickening. Mild right basilar opacity. No pleural effusion or pneumothorax. No acute osseous abnormality. IMPRESSION: 1. New interstitial pulmonary edema. 2. Mild right basilar opacity could reflect atelectasis or pneumonia. Electronically Signed   By: Obie Dredge M.D.   On: 06/17/2023 10:55    Scheduled Meds:  amLODipine  10 mg Oral Daily   apixaban  2.5 mg Oral BID   carvedilol  25 mg Oral BID WC   cloNIDine  0.1 mg Oral BID   hydrALAZINE  100 mg Oral Q8H   pantoprazole  40 mg Oral BID   rosuvastatin  10 mg Oral Daily   Continuous Infusions:  cefTRIAXone (ROCEPHIN)  IV     nitroGLYCERIN 200  mcg/min (06/18/23 8295)     LOS: 0 days   Hughie Closs, MD [facility]  06/18/2023, 10:49 AM   *Please note  that this is a verbal dictation therefore any spelling or grammatical errors are due to the "Dragon Medical One" system interpretation.  Please page via Amion and do not message via secure chat for urgent patient care matters. Secure chat can be used for non urgent patient care matters.  How to contact the [facility] Attending or Consulting provider 7A - 7P or covering provider during after hours 7P -7A, for this patient?  Check the care team in [facility] and look for a) attending/consulting TRH provider listed and b) the [facility] team listed. Page or secure chat 7A-7P. Log into [email or web address] and use [place]'s universal password to access. If you do not have the password, please contact the hospital operator. Locate the [facility] provider you are looking for under [facility] and page to a number that you can be directly reached. If you still have difficulty reaching the provider, please page the [facility] (Director on Call) for the Hospitalists listed on amion for assistance.

## 2023-06-18 NOTE — Care Management Obs Status (Signed)
MEDICARE OBSERVATION STATUS NOTIFICATION   Patient Details  Name: Monique Henderson MRN: [id number] Date of Birth: 12/12/1950   Medicare Observation Status Notification Given:  Yes    Oletta Cohn, RN 06/18/2023, 9:06 AM

## 2023-06-18 NOTE — ED Notes (Signed)
The pt is not coughing

## 2023-06-18 NOTE — Progress Notes (Addendum)
[facility] Progress Note   Subjective:   Patient seen in ED - last HD yesterday 1/16 with 3.4L UF. Plan for HD tomorrow to see if we can wean her off O2. Patient reports feeling better and denies any shortness of breath today. 3L O2 via nasal cannula in place. Denies any chest pain today.   Feels better but still requiring some supplemental oxygen.  Denies missing blood pressure medications.  Denies eating heavily salted food.  For dialysis tomorrow.  Objective Vitals:   06/18/23 1157 06/18/23 1200 06/18/23 1222 06/18/23 1245  BP: (!) 161/82 (!) 161/90 (!) 137/90 (!) 142/83  Pulse:      Resp: 15 18  (!) 22  Temp:      TempSrc:      SpO2:      Weight:      Height:       Physical Exam General: lying in bed, eating lunch. Able to lay flat comfortably without shortness of breath. NAD. Heart:irregular rhythm, 3+ murmur, runs of tachycardia.  Lungs: CTAB, no rales or increased work of breathing Abdomen: soft, non-tender, non-distended Extremities: no edema  Dialysis Access: R internal jugular TDC  Additional Objective Labs: Basic Metabolic Panel: Recent Labs  Lab 06/17/23 1134 06/18/23 0334  NA 139 134*  K 5.3* 4.2  CL 96* 94*  CO2 25 25  GLUCOSE 94 101*  BUN 57* 32*  CREATININE 8.86* 5.77*  CALCIUM 9.1 9.3    CBC: Recent Labs  Lab 06/17/23 1002 06/18/23 0334  WBC 7.6 4.5  HGB 10.7* 9.3*  HCT 34.5* 29.6*  MCV 93.2 91.9  PLT 219 187   Blood Culture    Component Value Date/Time   SDES BLOOD LEFT HAND 06/17/2023 1110   SPECREQUEST  06/17/2023 1110    BOTTLES DRAWN AEROBIC ONLY Blood Culture results may not be optimal due to an inadequate volume of blood received in culture bottles   CULT  06/17/2023 1110    NO GROWTH < 24 HOURS Performed at [facility], [address], [place], [place] [address]    REPTSTATUS PENDING 06/17/2023 1110     Studies/Results: DG Chest Portable 1 View Result Date: 06/17/2023 CLINICAL DATA:  Chest pain  and shortness of breath. EXAM: PORTABLE CHEST 1 VIEW COMPARISON:  Chest x-ray dated June 02, 2023. FINDINGS: Unchanged tunneled right internal jugular dialysis catheter. Stable cardiomegaly. New diffuse interstitial thickening. Mild right basilar opacity. No pleural effusion or pneumothorax. No acute osseous abnormality. IMPRESSION: 1. New interstitial pulmonary edema. 2. Mild right basilar opacity could reflect atelectasis or pneumonia. Electronically Signed   By: Obie Dredge M.D.   On: 06/17/2023 10:55   Medications:  nitroGLYCERIN Stopped (06/18/23 1101)    amLODipine  10 mg Oral Daily   apixaban  2.5 mg Oral BID   carvedilol  25 mg Oral BID WC   cloNIDine  0.1 mg Oral TID   hydrALAZINE  100 mg Oral Q8H   pantoprazole  40 mg Oral BID   rosuvastatin  10 mg Oral Daily    Dialysis Orders: AF TTS 3.5H 400/A1.5x 2K/2Ca TDC No heparin Mircera 225 q 2 wks (last 12/10) Hectrol 9 TIW Sensipar 60 TIW  Assessment/Plan: Acute hypoxic respiratory failure: required BiPAP on admit, now weaned to 3 L nasal cannula after HD yesterday (1/16). CXR 1/16 with pulmonary edema and possible PNA.  ESRD: TTS schedule, has not missed any sessions. Down to EDW. Still requiring O2, will attempt to remove more fluid with HD  tomorrow and wean her off O2.  HTN/volume: Severe HTN on admit, received IV medications and urgent HD and now improved.  Anemia of ESRD: Hgb 9.3, last dose mircera 12/10. Will give aranesp 200 mcg today.  Secondary HPTH: Ca ok, continue home medications as able.  Afib: on beta blocker + apixaban  Damian Leavell, PA-S Ozzie Hoyle, PA-C 06/18/2023, 12:48 PM  [facility]  I saw the patient and agree with the above assessment and plan.    Monique Miss, MD

## 2023-06-18 NOTE — ED Notes (Signed)
 CCMD notified. Pt is on monitor.

## 2023-06-18 NOTE — ED Notes (Signed)
Sister at the bedside

## 2023-06-19 DIAGNOSIS — E8770 Fluid overload, unspecified: Secondary | ICD-10-CM | POA: Diagnosis not present

## 2023-06-19 DIAGNOSIS — J449 Chronic obstructive pulmonary disease, unspecified: Secondary | ICD-10-CM | POA: Diagnosis not present

## 2023-06-19 DIAGNOSIS — I480 Paroxysmal atrial fibrillation: Secondary | ICD-10-CM | POA: Diagnosis not present

## 2023-06-19 LAB — RENAL FUNCTION PANEL
Albumin: 3.3 g/dL — ABNORMAL LOW (ref 3.5–5.0)
Anion gap: 15 (ref 5–15)
BUN: 55 mg/dL — ABNORMAL HIGH (ref 8–23)
CO2: 26 mmol/L (ref 22–32)
Calcium: 8.8 mg/dL — ABNORMAL LOW (ref 8.9–10.3)
Chloride: 94 mmol/L — ABNORMAL LOW (ref 98–111)
Creatinine, Ser: 9.29 mg/dL — ABNORMAL HIGH (ref 0.44–1.00)
GFR, Estimated: 4 mL/min — ABNORMAL LOW (ref >60)
Glucose, Bld: 91 mg/dL (ref 70–99)
Phosphorus: 8.5 mg/dL — ABNORMAL HIGH (ref 2.5–4.6)
Potassium: 5.1 mmol/L (ref 3.5–5.1)
Sodium: 135 mmol/L (ref 135–145)

## 2023-06-19 LAB — CBC
HCT: 28 % — ABNORMAL LOW (ref 36.0–46.0)
Hemoglobin: 8.9 g/dL — ABNORMAL LOW (ref 12.0–15.0)
MCH: 29 pg (ref 26.0–34.0)
MCHC: 31.8 g/dL (ref 30.0–36.0)
MCV: 91.2 fL (ref 80.0–100.0)
Platelets: 165 10*3/uL (ref 150–400)
RBC: 3.07 MIL/uL — ABNORMAL LOW (ref 3.87–5.11)
RDW: 17.4 % — ABNORMAL HIGH (ref 11.5–15.5)
WBC: 3.6 10*3/uL — ABNORMAL LOW (ref 4.0–10.5)
nRBC: 0 % (ref 0.0–0.2)

## 2023-06-19 MED ORDER — ANTICOAGULANT SODIUM CITRATE 4% (200MG/5ML) IV SOLN: 5.0000 mL | Status: DC | PRN

## 2023-06-19 MED ORDER — HEPARIN SODIUM (PORCINE) 1000 UNIT/ML DIALYSIS: 1000.0000 [IU] | INTRAMUSCULAR | Status: DC | PRN

## 2023-06-19 MED ORDER — CLONIDINE HCL 0.1 MG PO TABS: 0.1000 mg | ORAL_TABLET | Freq: Three times a day (TID) | ORAL | 0 refills | Status: DC

## 2023-06-19 MED ORDER — PENTAFLUOROPROP-TETRAFLUOROETH EX AERO: 1.0000 | INHALATION_SPRAY | CUTANEOUS | Status: DC | PRN

## 2023-06-19 MED ORDER — LIDOCAINE-PRILOCAINE 2.5-2.5 % EX CREA: 1.0000 | TOPICAL_CREAM | CUTANEOUS | Status: DC | PRN

## 2023-06-19 MED ORDER — LIDOCAINE HCL (PF) 1 % IJ SOLN: 5.0000 mL | INTRAMUSCULAR | Status: DC | PRN

## 2023-06-19 MED ORDER — MUSCLE RUB 10-15 % EX CREA
TOPICAL_CREAM | CUTANEOUS | Status: DC | PRN
  Filled 2023-06-19: qty 85

## 2023-06-19 MED ORDER — HEPARIN SODIUM (PORCINE) 1000 UNIT/ML IJ SOLN
3200.0000 [IU] | Freq: Once | INTRAMUSCULAR | Status: AC
  Administered 2023-06-19: 3800 [IU]
  Filled 2023-06-19: qty 4

## 2023-06-19 MED ORDER — ALTEPLASE 2 MG IJ SOLR: 2.0000 mg | Freq: Once | INTRAMUSCULAR | Status: DC | PRN

## 2023-06-19 NOTE — Procedures (Signed)
I was present at this dialysis session. I have reviewed the session itself and made appropriate changes.   Move to 3K bath with AM K of 5.1.  Hb stable 8.9.  UF goal of 3.5 L.  Stable resp status.   TRH and I discussed low Na diet.  Avoiding canned foods, frozen dinners.   Filed Weights   06/18/23 1934 06/19/23 0439 06/19/23 0812  Weight: 44.6 kg 45.1 kg 43.6 kg    Recent Labs  Lab 06/19/23 0751  NA 135  K 5.1  CL 94*  CO2 26  GLUCOSE 91  BUN 55*  CREATININE 9.29*  CALCIUM 8.8*  PHOS 8.5*    Recent Labs  Lab 06/17/23 1002 06/18/23 0334 06/19/23 0751  WBC 7.6 4.5 3.6*  HGB 10.7* 9.3* 8.9*  HCT 34.5* 29.6* 28.0*  MCV 93.2 91.9 91.2  PLT 219 187 165    Scheduled Meds:  amLODipine  10 mg Oral Daily   apixaban  2.5 mg Oral BID   carvedilol  25 mg Oral BID WC   cloNIDine  0.1 mg Oral TID   hydrALAZINE  100 mg Oral Q8H   pantoprazole  40 mg Oral BID   rosuvastatin  10 mg Oral Daily   Continuous Infusions:  anticoagulant sodium citrate     nitroGLYCERIN Stopped (06/18/23 1101)   PRN Meds:.acetaminophen **OR** acetaminophen, alteplase, anticoagulant sodium citrate, heparin, hydrALAZINE, lidocaine (PF), lidocaine-prilocaine, oxyCODONE-acetaminophen **AND** oxyCODONE, pentafluoroprop-tetrafluoroeth   Sabra Heck  MD 06/19/2023, 9:26 AM

## 2023-06-19 NOTE — Discharge Instructions (Signed)
Please limit your salt intake to 2 g or less per day.  Please avoid fried food, junk food, frozen food, canned food and packed chips.

## 2023-06-19 NOTE — Discharge Planning (Signed)
[facility] Patient Discharge Orders - [facility] CLINIC: [facility]  Patient's name: Monique Henderson Admit/DC Dates: 06/17/2023 - 06/19/23  DISCHARGE DIAGNOSES: AHRF/pulm edema  ?pneumonia - s/p course of abx Hyperkalemia   HD ORDER CHANGES: Heparin change: no EDW Change: YES New EDW: 41kg Bath Change: no  ANEMIA MANAGEMENT: Aranesp: Given: yes   Amount/Date of last dose: 1/17 ESA dose for discharge: mircera 225 mcg IV q 2 weeks, to start on 1/24 IV Iron dose at discharge: Per protocol Transfusion: Given: no  BONE/MINERAL MEDICATIONS: Hectorol/Calcitriol change: no Sensipar/Parsabiv change: no  ACCESS INTERVENTION/CHANGE: no Details:   RECENT LABS: Recent Labs  Lab 06/19/23 0751  HGB 8.9*  NA 135  K 5.1  CALCIUM 8.8*  PHOS 8.5*  ALBUMIN 3.3*    IV ANTIBIOTICS: no Details:  OTHER ANTICOAGULATION: On Coumadin?: no  OTHER/APPTS/LAB ORDERS:   D/C Meds to be reconciled by nurse after every discharge.  Completed By: Ozzie Hoyle, PA-C [facility] Pager [phone]   Reviewed by: MD:______ RN_______

## 2023-06-19 NOTE — Discharge Summary (Signed)
Physician Discharge Summary  Monique Henderson ZOX:[id number] DOB: 1950/08/23 DOA: 06/17/2023  PCP: Fleet Contras, MD  Admit date: 06/17/2023 Discharge date: 06/19/2023 30 Day Unplanned Readmission Risk Score    Flowsheet Row ED to Hosp-Admission (Current) from 06/17/2023 in [facility]  30 Day Unplanned Readmission Risk Score (%) 55.13 Filed at 06/19/2023 1200       This score is the patient's risk of an unplanned readmission within 30 days of being discharged (0 -100%). The score is based on dignosis, age, lab data, medications, orders, and past utilization.   Low:  0-14.9   Medium: 15-21.9   High: 22-29.9   Extreme: 30 and above          Admitted From: Home Disposition: Home  Recommendations for Outpatient Follow-up:  Follow up with PCP in 1-2 weeks Please obtain BMP/CBC in one week Please follow up with your PCP on the following pending results: Unresulted Labs (From admission, onward)     Start     Ordered   06/18/23 1356  Hepatitis B surface antibody,quantitative  (New Admission Hemo Labs (Hepatitis B))  Once,   R        06/18/23 1355   06/17/23 2223  Legionella Pneumophila Serogp 1 Ur Ag  (COPD / Pneumonia / Cellulitis / Lower Extremity Wound)  Once,   R        06/17/23 2222   06/17/23 2223  Expectorated Sputum Assessment w Gram Stain, Rflx to Resp Cult  (COPD / Pneumonia / Cellulitis / Lower Extremity Wound)  Once,   R        06/17/23 2222              Home Health: None Equipment/Devices: None  Discharge Condition: Stable CODE STATUS: Full code Diet recommendation: Low-sodium diet  HPI: Monique Henderson is a 73 y.o. female with medical history significant of ESRD on hemodialysis (TTS), A-fib on Eliquis, hypertension, hyperlipidemia, COPD, history of right breast cancer status postlumpectomy in 2014, GERD, hepatitis C treated in 2018, moderate protein calorie malnutrition, history of stroke with right-sided deficit,  chronic lower back pain status post lumbar spinal fusion surgery in 2010, and recent hospitalization from 06/02/2023 through 06/06/2023 for acute hypoxic respite failure secondary to pneumonia during which she was treated with IV Unasyn and transition to Augmentin and discharged on that and also had hypertensive emergency, yet again presented to ER with complaint of shortness of breath which she says that started last night, getting worse with laying flat.  She has been having cough since last admission.  Denies bringing up any phlegm.  Denies any fever, chills, sweating, nausea, vomiting or any other complaint.  Denies any recent travel or sick contact.  She also says that she has remained compliant with the medications and with the dialysis with the last dialysis on Tuesday, 06/15/2023.  When I saw her, she was on BiPAP, she wanted me to take her off of the BiPAP, I called the nurse and she was taken off of the BiPAP and actually felt better and she was fully alert and oriented and able to provide full history.   ED Course: Upon arrival to ED, she was significantly hypertensive, mildly hypoxic medical requiring 3 L of oxygen but she was still tachypneic so she was started on BiPAP by ED physician.  Afebrile.  Chest x-ray showed interstitial pulm edema and mild right basilar opacity which could be atelectasis or pneumonia.  No leukocytosis on CBC.  BMP pending at the time I was called.  She appeared fluid overloaded so nephrology was contacted for urgent dialysis.  Patient received cefepime in the ED for presumed pneumonia and 1 dose of hydralazine IV for hypertension but blood pressure did not improve so she was started on nitroglycerin drip and eventually hospitalist were called for further admission and management.  However I do see that her BMP is back and she has mild hyperkalemia 5.3.  She already has dialysis orders.  Subjective: Seen and examined.  Feels much better.  No shortness of breath or any other  complaint.  She is ready to go home.  Brief/Interim Summary: Patient was admitted for the following.  Acute hypoxic respiratory failure secondary to acute on chronic combined systolic and diastolic congestive heart failure/volume overload, POA: Chest x-ray with vascular congestion, this is despite of her not missing the dialysis.  Patient required BiPAP upon arrival, after dialysis, she was weaned to nasal cannula, this morning she is on room air and comfortable.   Healthcare associated pneumonia ruled out: Although chest x-ray does show possibility of infiltrate however clinically, she does not appear to have pneumonia, afebrile with no leukocytosis.  Procalcitonin is unreliable in ESRD  She received cefepime in the ED and since she was still in respiratory distress so I continued her on Rocephin and Zithromax was contraindicated due to prolonged QTc.  Patient's respiratory status has improved significantly since she received dialysis yesterday, she has remained afebrile with no leukocytosis.  Clinically, she does not appear to have pneumonia so we discontinued her antibiotics after last dose given on 06/17/2023 and patient has continued to improve with dialysis with no fever and no leukocytosis.   Hypertensive emergency: Blood pressure has improved with continued dialysis and resumption of the home medications.  We will continue patient's home medications including amlodipine 10 mg, Coreg 25 mg p.o. twice daily, hydralazine 100 mg 3 times daily.  However I increased her clonidine from twice daily to 3 times daily.  Also, after discussing with the patient, she endorsed eating canned food, frozen food, back to chips and sometimes [place] fries.  She was educated on avoiding those kind of foods which are high in salt.  This is likely the reason of her readmission.  Hopefully she will comply and remain healthy at home.   ESRD on HD/hyperkalemia: Nephrology on board and she received dialysis yesterday upon  arrival and 1 today.  Follow routine dialysis schedule as outpatient.   Paroxysmal A-fib: In sinus rhythm currently.  Continue beta-blocker and Eliquis.   History of stroke with right-sided hemiparesis/hyperlipidemia: Resume statin and Eliquis.   History of ascending aortic dissection status post endovascular repair.   History of COPD documented: Does not appear to be in acute exacerbation.  Does not appear to be taking bronchodilators at home either.  Discharge plan was discussed with patient and/or family member and they verbalized understanding and agreed with it.  Discharge Diagnoses:  Active Problems:   Fluid overload   Hypertensive emergency   History of CVA (cerebrovascular accident)   Paroxysmal atrial fibrillation (HCC)   Acute on chronic combined systolic and diastolic CHF (congestive heart failure) (HCC)   Chronic anticoagulation   Tobacco use disorder   GERD   Chronic obstructive lung disease (HCC)   ESRD (end stage renal disease) (HCC)   Dyslipidemia   Acute respiratory failure with hypoxia (HCC)   Tricuspid valve insufficiency   History of aortic dissection   History of cocaine abuse (HCC)  Discharge Instructions   Allergies as of 06/19/2023       Reactions   Shrimp [shellfish Allergy] Shortness Of Breath   Bactroban [mupirocin] Other (See Comments)   "Sores in nose"   Hydrocodone Itching, Nausea Only, Nausea And Vomiting   Chlorhexidine Gluconate Itching   Tylenol [acetaminophen] Itching   Takes Percocet at home 05/06/23   Zestril [lisinopril] Cough        Medication List     TAKE these medications    albuterol (2.5 MG/3ML) 0.083% nebulizer solution Commonly known as: PROVENTIL Take 2.5 mg by nebulization every 6 (six) hours as needed for wheezing or shortness of breath.   albuterol 108 (90 Base) MCG/ACT inhaler Commonly known as: VENTOLIN HFA Inhale 2 puffs into the lungs every 6 (six) hours as needed for wheezing or shortness of breath.    amLODipine 10 MG tablet Commonly known as: NORVASC Take 1 tablet (10 mg total) by mouth daily.   apixaban 2.5 MG Tabs tablet Commonly known as: ELIQUIS Take 1 tablet (2.5 mg total) by mouth 2 (two) times daily.   carvedilol 25 MG tablet Commonly known as: COREG Take 1 tablet (25 mg total) by mouth 2 (two) times daily with a meal.   cloNIDine 0.1 MG tablet Commonly known as: Catapres Take 1 tablet (0.1 mg total) by mouth 3 (three) times daily. What changed: when to take this   hydrALAZINE 100 MG tablet Commonly known as: APRESOLINE Take 1 tablet (100 mg total) by mouth every 8 (eight) hours.   isosorbide mononitrate 30 MG 24 hr tablet Commonly known as: IMDUR TAKE 1 TABLET (30MG ) ON DIALYSIS DAYS AND 2 TABLETS (60 MG) ON NON DIALYSIS DAYS.   naloxone 4 MG/0.1ML Liqd nasal spray kit Commonly known as: NARCAN 1 spray as directed.   oxyCODONE-acetaminophen 10-325 MG tablet Commonly known as: PERCOCET Take 1 tablet by mouth in the morning and at bedtime.   pantoprazole 40 MG tablet Commonly known as: PROTONIX TAKE 1 TABLET (40 MG TOTAL) BY MOUTH 2 (TWO) TIMES DAILY.   rosuvastatin 10 MG tablet Commonly known as: CRESTOR TAKE 1 TABLET (10 MG TOTAL) BY MOUTH DAILY FOR CHOLESTEROL   senna-docusate 8.6-50 MG tablet Commonly known as: Senokot-S Take 1 tablet by mouth 2 (two) times daily between meals as needed for mild constipation.   sevelamer carbonate 2.4 g Pack Commonly known as: RENVELA Take 2.4 g by mouth 3 (three) times daily with meals.   traZODone 50 MG tablet Commonly known as: DESYREL Take 25-50 mg by mouth at bedtime as needed for sleep.   VITAMIN D-3 PO Take 1 capsule by mouth daily.        Follow-up Information     Fleet Contras, MD Follow up in 1 week(s).   Specialty: Internal Medicine Contact information: [address] [place] [address] [phone]                Allergies  Allergen Reactions   Shrimp [Shellfish Allergy]  Shortness Of Breath   Bactroban [Mupirocin] Other (See Comments)    "Sores in nose"   Hydrocodone Itching, Nausea Only and Nausea And Vomiting   Chlorhexidine Gluconate Itching   Tylenol [Acetaminophen] Itching    Takes Percocet at home 05/06/23   Zestril [Lisinopril] Cough    Consultations: Nephrology   Procedures/Studies: DG Chest Portable 1 View Result Date: 06/17/2023 CLINICAL DATA:  Chest pain and shortness of breath. EXAM: PORTABLE CHEST 1 VIEW COMPARISON:  Chest x-ray dated June 02, 2023. FINDINGS: Unchanged  tunneled right internal jugular dialysis catheter. Stable cardiomegaly. New diffuse interstitial thickening. Mild right basilar opacity. No pleural effusion or pneumothorax. No acute osseous abnormality. IMPRESSION: 1. New interstitial pulmonary edema. 2. Mild right basilar opacity could reflect atelectasis or pneumonia. Electronically Signed   By: Obie Dredge M.D.   On: 06/17/2023 10:55   ECHOCARDIOGRAM COMPLETE Result Date: 06/03/2023    ECHOCARDIOGRAM REPORT   Patient Name:   Monique Henderson Date of Exam: 06/03/2023 Medical Rec #:  [id number]          Height:       63.0 in Accession #:    [id number]         Weight:       106.0 lb Date of Birth:  1951-02-21           BSA:          1.476 m Patient Age:    72 years           BP:           131/83 mmHg Patient Gender: F                  HR:           73 bpm. Exam Location:  Inpatient Procedure: 2D Echo, Color Doppler, Cardiac Doppler and Strain Analysis Indications:    acute systolic chf  History:        Patient has prior history of Echocardiogram examinations, most                 recent 06/19/2022. COPD and end stage renal disease. Hepatitis                 C.; Risk Factors:Hypertension and Dyslipidemia.  Sonographer:    Delcie Roch RDCS Referring Phys: [id number] Ronnald Ramp O'NEAL  Sonographer Comments: Global longitudinal strain was attempted. IMPRESSIONS  1. Left ventricular ejection fraction, by estimation, is 45%. The left  ventricle has mildly decreased function. The left ventricle demonstrates regional wall motion abnormalities with apical akinesis. There is severe concentric left ventricular hypertrophy. Consider cardiac amyloidosis. Left ventricular diastolic parameters are consistent with Grade II diastolic dysfunction (pseudonormalization). The average left ventricular global longitudinal strain is -3.8 %. The global longitudinal strain is abnormal.  2. Right ventricular systolic function is moderately reduced. The right ventricular size is normal. There is mildly elevated pulmonary artery systolic pressure. The estimated right ventricular systolic pressure is 41.9 mmHg.  3. Left atrial size was moderately dilated.  4. The mitral valve is normal in structure. Trivial mitral valve regurgitation. No evidence of mitral stenosis.  5. Tricuspid valve regurgitation is mild to moderate.  6. The aortic valve is tricuspid. There is mild calcification of the aortic valve. Aortic valve regurgitation is not visualized. No aortic stenosis is present.  7. Aortic dilatation noted.  8. The inferior vena cava is normal in size with greater than 50% respiratory variability, suggesting right atrial pressure of 3 mmHg. FINDINGS  Left Ventricle: Left ventricular ejection fraction, by estimation, is 45%. The left ventricle has mildly decreased function. The left ventricle demonstrates regional wall motion abnormalities. The average left ventricular global longitudinal strain is -3.8 %. The global longitudinal strain is abnormal. The left ventricular internal cavity size was normal in size. There is severe concentric left ventricular hypertrophy. Left ventricular diastolic parameters are consistent with Grade II diastolic dysfunction (pseudonormalization). Right Ventricle: The right ventricular size is normal. No increase in right ventricular wall thickness. Right ventricular  systolic function is moderately reduced. There is mildly elevated pulmonary  artery systolic pressure. The tricuspid regurgitant velocity is 3.12 m/s, and with an assumed right atrial pressure of 3 mmHg, the estimated right ventricular systolic pressure is 41.9 mmHg. Left Atrium: Left atrial size was moderately dilated. Right Atrium: Right atrial size was normal in size. Pericardium: There is no evidence of pericardial effusion. Mitral Valve: The mitral valve is normal in structure. Trivial mitral valve regurgitation. No evidence of mitral valve stenosis. Tricuspid Valve: The tricuspid valve is normal in structure. Tricuspid valve regurgitation is mild to moderate. Aortic Valve: The aortic valve is tricuspid. There is mild calcification of the aortic valve. Aortic valve regurgitation is not visualized. No aortic stenosis is present. Pulmonic Valve: The pulmonic valve was normal in structure. Pulmonic valve regurgitation is not visualized. Aorta: Aortic dilatation noted. Venous: The inferior vena cava is normal in size with greater than 50% respiratory variability, suggesting right atrial pressure of 3 mmHg. IAS/Shunts: No atrial level shunt detected by color flow Doppler.  LEFT VENTRICLE PLAX 2D LVIDd:         4.20 cm   Diastology LVIDs:         3.30 cm   LV e' medial:    5.22 cm/s LV PW:         1.50 cm   LV E/e' medial:  21.8 LV IVS:        1.30 cm   LV e' lateral:   3.70 cm/s LVOT diam:     2.10 cm   LV E/e' lateral: 30.8 LV SV:         50 LV SV Index:   34        2D Longitudinal Strain LVOT Area:     3.46 cm  2D Strain GLS Avg:     -3.8 %  RIGHT VENTRICLE            IVC RV Basal diam:  2.90 cm    IVC diam: 1.50 cm RV S prime:     6.20 cm/s TAPSE (M-mode): 1.1 cm LEFT ATRIUM           Index        RIGHT ATRIUM           Index LA diam:      4.00 cm 2.71 cm/m   RA Area:     14.60 cm LA Vol (A4C): 59.4 ml 40.23 ml/m  RA Volume:   30.50 ml  20.66 ml/m  AORTIC VALVE LVOT Vmax:   85.20 cm/s LVOT Vmean:  53.400 cm/s LVOT VTI:    0.145 m  AORTA Ao Root diam: 2.70 cm Ao Asc diam:  2.90 cm  MITRAL VALVE                TRICUSPID VALVE MV Area (PHT): 3.99 cm     TR Peak grad:   38.9 mmHg MV Decel Time: 190 msec     TR Vmax:        312.00 cm/s MV E velocity: 114.00 cm/s MV A velocity: 106.00 cm/s  SHUNTS MV E/A ratio:  1.08         Systemic VTI:  0.14 m                             Systemic Diam: 2.10 cm Dalton McleanMD Electronically signed by Wilfred Lacy Signature Date/Time: 06/03/2023/6:04:44 PM    Final    DG Chest  Port 1 View Result Date: 06/02/2023 CLINICAL DATA:  Possible sepsis, initial encounter EXAM: PORTABLE CHEST 1 VIEW COMPARISON:  07/28/2022 FINDINGS: Cardiac shadow is enlarged but stable. Aortic calcifications and postoperative changes are again seen. Dialysis catheter is again noted on right stable from the prior exam. Lungs are well aerated bilaterally. New right basilar infiltrate is seen. IMPRESSION: New right basilar infiltrate. Electronically Signed   By: Alcide Clever M.D.   On: 06/02/2023 01:27   CT HEAD WO CONTRAST ( ) Result Date: 06/02/2023 CLINICAL DATA:  Altered mental status EXAM: CT HEAD WITHOUT CONTRAST TECHNIQUE: Contiguous axial images were obtained from the base of the skull through the vertex without intravenous contrast. RADIATION DOSE REDUCTION: This exam was performed according to the departmental dose-optimization program which includes automated exposure control, adjustment of the mA and/or kV according to patient size and/or use of iterative reconstruction technique. COMPARISON:  02/23/2022 FINDINGS: Brain: No evidence of acute infarction, hemorrhage, hydrocephalus, extra-axial collection or mass lesion/mass effect. Mild atrophic changes and chronic white matter ischemic changes are seen. Findings consistent with prior left parietal infarct are noted. Vascular: No hyperdense vessel or unexpected calcification. Skull: Normal. Negative for fracture or focal lesion. Sinuses/Orbits: No acute finding. Other: None. IMPRESSION: Chronic atrophic and ischemic  changes. No acute abnormality is noted. Electronically Signed   By: Alcide Clever M.D.   On: 06/02/2023 01:25   DG Chest Port 1 View Result Date: 05/28/2023 CLINICAL DATA:  Bleeding from dialysis catheter site, initial encounter EXAM: PORTABLE CHEST 1 VIEW COMPARISON:  05/06/2023 FINDINGS: Cardiac shadow is enlarged. Postsurgical changes are again noted. Right jugular dialysis catheter is noted in satisfactory position. No complicating factors are seen. Lungs are clear. No bony abnormality is noted. IMPRESSION: No active disease. Electronically Signed   By: Alcide Clever M.D.   On: 05/28/2023 20:58     Discharge Exam: Vitals:   06/19/23 1201 06/19/23 1204  BP: 114/74 (!) 133/97  Pulse: 63 61  Resp: (!) 23 15  Temp: (!) 97.5 F (36.4 C)   SpO2: 95% 99%   Vitals:   06/19/23 1149 06/19/23 1201 06/19/23 1204 06/19/23 1210  BP: (!) 135/92 114/74 (!) 133/97   Pulse: 70 63 61   Resp: (!) 26 (!) 23 15   Temp:  (!) 97.5 F (36.4 C)    TempSrc:  Oral    SpO2: 99% 95% 99%   Weight:    41 kg  Height:        General: Pt is alert, awake, not in acute distress Cardiovascular: RRR, S1/S2 +, no rubs, no gallops Respiratory: CTA bilaterally, no wheezing, no rhonchi Abdominal: Soft, NT, ND, bowel sounds + Extremities: no edema, no cyanosis    The results of significant diagnostics from this hospitalization (including imaging, microbiology, ancillary and laboratory) are listed below for reference.     Microbiology: Recent Results (from the past 240 hours)  Culture, blood (routine x 2)     Status: None (Preliminary result)   Collection Time: 06/17/23 11:05 AM   Specimen: BLOOD RIGHT HAND  Result Value Ref Range Status   Specimen Description BLOOD RIGHT HAND  Final   Special Requests   Final    BOTTLES DRAWN AEROBIC ONLY Blood Culture adequate volume   Culture   Final    NO GROWTH 2 DAYS Performed at [facility], [address] [place], [place] [address]    Report Status  PENDING  Incomplete  Culture, blood (routine x 2)  Status: None (Preliminary result)   Collection Time: 06/17/23 11:10 AM   Specimen: BLOOD LEFT HAND  Result Value Ref Range Status   Specimen Description BLOOD LEFT HAND  Final   Special Requests   Final    BOTTLES DRAWN AEROBIC ONLY Blood Culture results may not be optimal due to an inadequate volume of blood received in culture bottles   Culture   Final    NO GROWTH 2 DAYS Performed at [facility], [address], [place], [place] [address]    Report Status PENDING  Incomplete  Resp panel by RT-PCR (RSV, Flu A&B, Covid) Anterior Nasal Swab     Status: None   Collection Time: 06/17/23 12:19 PM   Specimen: Anterior Nasal Swab  Result Value Ref Range Status   SARS Coronavirus 2 by RT PCR NEGATIVE NEGATIVE Final   Influenza A by PCR NEGATIVE NEGATIVE Final   Influenza B by PCR NEGATIVE NEGATIVE Final    Comment: (NOTE) The Xpert Xpress SARS-CoV-2/FLU/RSV plus assay is intended as an aid in the diagnosis of influenza from Nasopharyngeal swab specimens and should not be used as a sole basis for treatment. Nasal washings and aspirates are unacceptable for Xpert Xpress SARS-CoV-2/FLU/RSV testing.  Fact Sheet for Patients: BloggerCourse.com  Fact Sheet for Healthcare Providers: SeriousBroker.it  This test is not yet approved or cleared by the [place] FDA and has been authorized for detection and/or diagnosis of SARS-CoV-2 by FDA under an Emergency Use Authorization (EUA). This EUA will remain in effect (meaning this test can be used) for the duration of the COVID-19 declaration under Section 564(b)(1) of the Act, 21 U.S.C. section 360bbb-3(b)(1), unless the authorization is terminated or revoked.     Resp Syncytial Virus by PCR NEGATIVE NEGATIVE Final    Comment: (NOTE) Fact Sheet for Patients: BloggerCourse.com  Fact Sheet for Healthcare  Providers: SeriousBroker.it  This test is not yet approved or cleared by the [place] FDA and has been authorized for detection and/or diagnosis of SARS-CoV-2 by FDA under an Emergency Use Authorization (EUA). This EUA will remain in effect (meaning this test can be used) for the duration of the COVID-19 declaration under Section 564(b)(1) of the Act, 21 U.S.C. section 360bbb-3(b)(1), unless the authorization is terminated or revoked.  Performed at [facility], [address] [place], [place] [address]      Labs: BNP (last 3 results) Recent Labs    04/02/23 0208 05/06/23 0333 06/18/23 0334  BNP >4,500.0* >4,500.0* >4,500.0*   Basic Metabolic Panel: Recent Labs  Lab 06/17/23 1134 06/18/23 0334 06/19/23 0751  NA 139 134* 135  K 5.3* 4.2 5.1  CL 96* 94* 94*  CO2 25 25 26   GLUCOSE 94 101* 91  BUN 57* 32* 55*  CREATININE 8.86* 5.77* 9.29*  CALCIUM 9.1 9.3 8.8*  PHOS  --   --  8.5*   Liver Function Tests: Recent Labs  Lab 06/19/23 0751  ALBUMIN 3.3*   No results for input(s): "LIPASE", "AMYLASE" in the last 168 hours. No results for input(s): "AMMONIA" in the last 168 hours. CBC: Recent Labs  Lab 06/17/23 1002 06/18/23 0334 06/19/23 0751  WBC 7.6 4.5 3.6*  HGB 10.7* 9.3* 8.9*  HCT 34.5* 29.6* 28.0*  MCV 93.2 91.9 91.2  PLT 219 187 165   Cardiac Enzymes: No results for input(s): "CKTOTAL", "CKMB", "CKMBINDEX", "TROPONINI" in the last 168 hours. BNP: Invalid input(s): "POCBNP" CBG: No results for input(s): "GLUCAP" in the last 168 hours. D-Dimer  No results for input(s): "DDIMER" in the last 72 hours. Hgb A1c No results for input(s): "HGBA1C" in the last 72 hours. Lipid Profile No results for input(s): "CHOL", "HDL", "LDLCALC", "TRIG", "CHOLHDL", "LDLDIRECT" in the last 72 hours. Thyroid function studies No results for input(s): "TSH", "T4TOTAL", "T3FREE", "THYROIDAB" in the last 72 hours.  Invalid input(s):  "FREET3" Anemia work up No results for input(s): "VITAMINB12", "FOLATE", "FERRITIN", "TIBC", "IRON", "RETICCTPCT" in the last 72 hours. Urinalysis    Component Value Date/Time   COLORURINE AMBER (A) 12/27/2021 1840   APPEARANCEUR TURBID (A) 12/27/2021 1840   LABSPEC 1.013 12/27/2021 1840   PHURINE 8.0 12/27/2021 1840   GLUCOSEU NEGATIVE 12/27/2021 1840   GLUCOSEU NEG mg/dL 40/98/1191 4782   HGBUR MODERATE (A) 12/27/2021 1840   BILIRUBINUR NEGATIVE 12/27/2021 1840   BILIRUBINUR negative 05/04/2018 0910   KETONESUR NEGATIVE 12/27/2021 1840   PROTEINUR >=300 (A) 12/27/2021 1840   UROBILINOGEN 0.2 05/04/2018 0910   UROBILINOGEN 1 02/14/2014 0946   NITRITE NEGATIVE 12/27/2021 1840   LEUKOCYTESUR LARGE (A) 12/27/2021 1840   Sepsis Labs Recent Labs  Lab 06/17/23 1002 06/18/23 0334 06/19/23 0751  WBC 7.6 4.5 3.6*   Microbiology Recent Results (from the past 240 hours)  Culture, blood (routine x 2)     Status: None (Preliminary result)   Collection Time: 06/17/23 11:05 AM   Specimen: BLOOD RIGHT HAND  Result Value Ref Range Status   Specimen Description BLOOD RIGHT HAND  Final   Special Requests   Final    BOTTLES DRAWN AEROBIC ONLY Blood Culture adequate volume   Culture   Final    NO GROWTH 2 DAYS Performed at [facility], [address] [place], [place] [address]    Report Status PENDING  Incomplete  Culture, blood (routine x 2)     Status: None (Preliminary result)   Collection Time: 06/17/23 11:10 AM   Specimen: BLOOD LEFT HAND  Result Value Ref Range Status   Specimen Description BLOOD LEFT HAND  Final   Special Requests   Final    BOTTLES DRAWN AEROBIC ONLY Blood Culture results may not be optimal due to an inadequate volume of blood received in culture bottles   Culture   Final    NO GROWTH 2 DAYS Performed at [facility], [address], [place], [place] [address]    Report Status PENDING  Incomplete  Resp panel by RT-PCR (RSV, Flu A&B, Covid)  Anterior Nasal Swab     Status: None   Collection Time: 06/17/23 12:19 PM   Specimen: Anterior Nasal Swab  Result Value Ref Range Status   SARS Coronavirus 2 by RT PCR NEGATIVE NEGATIVE Final   Influenza A by PCR NEGATIVE NEGATIVE Final   Influenza B by PCR NEGATIVE NEGATIVE Final    Comment: (NOTE) The Xpert Xpress SARS-CoV-2/FLU/RSV plus assay is intended as an aid in the diagnosis of influenza from Nasopharyngeal swab specimens and should not be used as a sole basis for treatment. Nasal washings and aspirates are unacceptable for Xpert Xpress SARS-CoV-2/FLU/RSV testing.  Fact Sheet for Patients: BloggerCourse.com  Fact Sheet for Healthcare Providers: SeriousBroker.it  This test is not yet approved or cleared by the [place] FDA and has been authorized for detection and/or diagnosis of SARS-CoV-2 by FDA under an Emergency Use Authorization (EUA). This EUA will remain in effect (meaning this test can be used) for the duration of the COVID-19 declaration under Section 564(b)(1) of the Act, 21 U.S.C. section 360bbb-3(b)(1),  unless the authorization is terminated or revoked.     Resp Syncytial Virus by PCR NEGATIVE NEGATIVE Final    Comment: (NOTE) Fact Sheet for Patients: BloggerCourse.com  Fact Sheet for Healthcare Providers: SeriousBroker.it  This test is not yet approved or cleared by the [place] FDA and has been authorized for detection and/or diagnosis of SARS-CoV-2 by FDA under an Emergency Use Authorization (EUA). This EUA will remain in effect (meaning this test can be used) for the duration of the COVID-19 declaration under Section 564(b)(1) of the Act, 21 U.S.C. section 360bbb-3(b)(1), unless the authorization is terminated or revoked.  Performed at [facility], [address], [place], [place] [address]     FURTHER DISCHARGE INSTRUCTIONS:    Get Medicines reviewed and adjusted: Please take all your medications with you for your next visit with your Primary MD   Laboratory/radiological data: Please request your Primary MD to go over all hospital tests and procedure/radiological results at the follow up, please ask your Primary MD to get all Hospital records sent to his/her office.   In some cases, they will be blood work, cultures and biopsy results pending at the time of your discharge. Please request that your primary care M.D. goes through all the records of your hospital data and follows up on these results.   Also Note the following: If you experience worsening of your admission symptoms, develop shortness of breath, life threatening emergency, suicidal or homicidal thoughts you must seek medical attention immediately by calling 911 or calling your MD immediately  if symptoms less severe.   You must read complete instructions/literature along with all the possible adverse reactions/side effects for all the Medicines you take and that have been prescribed to you. Take any new Medicines after you have completely understood and accpet all the possible adverse reactions/side effects.    Do not drive when taking Pain medications or sleeping medications (Benzodaizepines)   Do not take more than prescribed Pain, Sleep and Anxiety Medications. It is not advisable to combine anxiety,sleep and pain medications without talking with your primary care practitioner   Special Instructions: If you have smoked or chewed Tobacco  in the last 2 yrs please stop smoking, stop any regular Alcohol  and or any Recreational drug use.   Wear Seat belts while driving.   Please note: You were cared for by a hospitalist during your hospital stay. Once you are discharged, your primary care physician will handle any further medical issues. Please note that NO REFILLS for any discharge medications will be authorized once you are discharged, as it is  imperative that you return to your primary care physician (or establish a relationship with a primary care physician if you do not have one) for your post hospital discharge needs so that they can reassess your need for medications and monitor your lab values  Time coordinating discharge: Over 30 minutes  SIGNED:   Hughie Closs, MD  [facility] 06/19/2023, 12:41 PM *Please note that this is a verbal dictation therefore any spelling or grammatical errors are due to the "Dragon Medical One" system interpretation. If 7PM-7AM, please contact night-coverage [email or web address]

## 2023-06-19 NOTE — Plan of Care (Signed)

## 2023-06-19 NOTE — Progress Notes (Signed)
Received patient in bed to unit.  Alert and oriented.  Informed consent signed and in chart.   TX duration:3.5 hours.  Patient cramped at end of session and only got 2.6L off  Patient tolerated well.  Transported back to the room  Alert, without acute distress.  Hand-off given to patient's nurse.   Access used: R internal jugular HD cath Access issues: none  Total UF removed: 2.6L Medication(s) given: none   06/19/23 1201  Vitals  Temp (!) 97.5 F (36.4 C)  Temp Source Oral  BP 114/74  BP Location Left Arm  BP Method Automatic  Patient Position (if appropriate) Lying  Pulse Rate 63  Pulse Rate Source Monitor  Resp (!) 23  Oxygen Therapy  SpO2 95 %  O2 Device Room Air  Patient Activity (if Appropriate) In bed  Pulse Oximetry Type Continuous  Oximetry Probe Site Changed No  During Treatment Monitoring  Duration of HD Treatment -hour(s) 3.5 hour(s)  Cumulative Fluid Removed (mL) per Treatment  2622.92  HD Safety Checks Performed Yes  Intra-Hemodialysis Comments Tx completed  Dialysis Fluid Bolus Normal Saline  Bolus Amount (mL) 300 mL  Hemodialysis Catheter Right Subclavian Double lumen Permanent (Tunneled)  Placement Date/Time: 05/28/23 0942   Placed prior to admission: No  Serial / Lot #: [id number]  Expiration Date: 04/01/27  Time Out: Correct patient;Correct site;Correct procedure  Maximum sterile barrier precautions: Sterile gloves;Sterile gown;Mask;C...  Site Condition No complications  Blue Lumen Status Flushed;Heparin locked;Dead end cap in place  Red Lumen Status Flushed;Heparin locked;Dead end cap in place  Purple Lumen Status N/A  Catheter fill solution Heparin 1000 units/ml  Catheter fill volume (Arterial) 1.9 cc  Catheter fill volume (Venous) 1.9  Dressing Type Transparent  Dressing Status Antimicrobial disc in place;Clean, Dry, Intact  Interventions Other (Comment) (deaccessed)  Drainage Description None  Dressing Change Due 06/24/23  Post treatment  catheter status Capped and Clamped     Stacie Glaze LPN [facility]

## 2023-06-20 LAB — LEGIONELLA PNEUMOPHILA SEROGP 1 UR AG: L. pneumophila Serogp 1 Ur Ag: NEGATIVE

## 2023-06-21 NOTE — Progress Notes (Signed)
Late Note Entry- Jun 21, 2023  Pt was d/c on Saturday. Contacted [facility] this morning to be advised of pt's d/c date and that pt should resume care tomorrow.   Olivia Canter Renal Navigator [phone]

## 2023-06-22 LAB — CULTURE, BLOOD (ROUTINE X 2)
Culture: NO GROWTH
Culture: NO GROWTH
Special Requests: ADEQUATE

## 2023-06-22 LAB — HEPATITIS B SURFACE ANTIBODY, QUANTITATIVE: Hep B S AB Quant (Post): 48.9 m[IU]/mL

## 2023-06-25 ENCOUNTER — Emergency Department (HOSPITAL_COMMUNITY): Payer: Medicare Other

## 2023-06-25 ENCOUNTER — Inpatient Hospital Stay (HOSPITAL_COMMUNITY)
Admission: EM | Admit: 2023-06-25 | Discharge: 2023-06-27 | DRG: 308 | Disposition: A | Discharge status: Home / Self Care | Payer: Medicare Other | Attending: Internal Medicine | Admitting: Internal Medicine

## 2023-06-25 DIAGNOSIS — Z888 Allergy status to other drugs, medicaments and biological substances status: Secondary | ICD-10-CM

## 2023-06-25 DIAGNOSIS — Z886 Allergy status to analgesic agent status: Secondary | ICD-10-CM

## 2023-06-25 DIAGNOSIS — E785 Hyperlipidemia, unspecified: Secondary | ICD-10-CM | POA: Diagnosis present

## 2023-06-25 DIAGNOSIS — Z981 Arthrodesis status: Secondary | ICD-10-CM

## 2023-06-25 DIAGNOSIS — M898X9 Other specified disorders of bone, unspecified site: Secondary | ICD-10-CM | POA: Diagnosis present

## 2023-06-25 DIAGNOSIS — Z853 Personal history of malignant neoplasm of breast: Secondary | ICD-10-CM

## 2023-06-25 DIAGNOSIS — I480 Paroxysmal atrial fibrillation: Secondary | ICD-10-CM | POA: Diagnosis not present

## 2023-06-25 DIAGNOSIS — R479 Unspecified speech disturbances: Secondary | ICD-10-CM

## 2023-06-25 DIAGNOSIS — N186 End stage renal disease: Secondary | ICD-10-CM | POA: Diagnosis present

## 2023-06-25 DIAGNOSIS — G4700 Insomnia, unspecified: Secondary | ICD-10-CM | POA: Diagnosis present

## 2023-06-25 DIAGNOSIS — E872 Acidosis, unspecified: Secondary | ICD-10-CM | POA: Diagnosis present

## 2023-06-25 DIAGNOSIS — Z91013 Allergy to seafood: Secondary | ICD-10-CM

## 2023-06-25 DIAGNOSIS — Z833 Family history of diabetes mellitus: Secondary | ICD-10-CM

## 2023-06-25 DIAGNOSIS — Z91158 Patient's noncompliance with renal dialysis for other reason: Secondary | ICD-10-CM

## 2023-06-25 DIAGNOSIS — K219 Gastro-esophageal reflux disease without esophagitis: Secondary | ICD-10-CM | POA: Diagnosis present

## 2023-06-25 DIAGNOSIS — Z8673 Personal history of transient ischemic attack (TIA), and cerebral infarction without residual deficits: Secondary | ICD-10-CM

## 2023-06-25 DIAGNOSIS — E875 Hyperkalemia: Secondary | ICD-10-CM | POA: Diagnosis present

## 2023-06-25 DIAGNOSIS — Z820 Family history of epilepsy and other diseases of the nervous system: Secondary | ICD-10-CM

## 2023-06-25 DIAGNOSIS — W19XXXA Unspecified fall, initial encounter: Principal | ICD-10-CM

## 2023-06-25 DIAGNOSIS — I5042 Chronic combined systolic (congestive) and diastolic (congestive) heart failure: Secondary | ICD-10-CM | POA: Diagnosis present

## 2023-06-25 DIAGNOSIS — Z87891 Personal history of nicotine dependence: Secondary | ICD-10-CM

## 2023-06-25 DIAGNOSIS — D631 Anemia in chronic kidney disease: Secondary | ICD-10-CM | POA: Diagnosis present

## 2023-06-25 DIAGNOSIS — I4891 Unspecified atrial fibrillation: Secondary | ICD-10-CM | POA: Diagnosis present

## 2023-06-25 DIAGNOSIS — Z992 Dependence on renal dialysis: Secondary | ICD-10-CM

## 2023-06-25 DIAGNOSIS — Z66 Do not resuscitate: Secondary | ICD-10-CM | POA: Diagnosis present

## 2023-06-25 DIAGNOSIS — I132 Hypertensive heart and chronic kidney disease with heart failure and with stage 5 chronic kidney disease, or end stage renal disease: Secondary | ICD-10-CM | POA: Diagnosis present

## 2023-06-25 DIAGNOSIS — Z885 Allergy status to narcotic agent status: Secondary | ICD-10-CM

## 2023-06-25 DIAGNOSIS — J449 Chronic obstructive pulmonary disease, unspecified: Secondary | ICD-10-CM | POA: Diagnosis present

## 2023-06-25 DIAGNOSIS — E871 Hypo-osmolality and hyponatremia: Secondary | ICD-10-CM | POA: Diagnosis present

## 2023-06-25 DIAGNOSIS — Z7901 Long term (current) use of anticoagulants: Secondary | ICD-10-CM

## 2023-06-25 DIAGNOSIS — R0902 Hypoxemia: Secondary | ICD-10-CM | POA: Diagnosis present

## 2023-06-25 DIAGNOSIS — Z8249 Family history of ischemic heart disease and other diseases of the circulatory system: Secondary | ICD-10-CM

## 2023-06-25 DIAGNOSIS — Z800 Family history of malignant neoplasm of digestive organs: Secondary | ICD-10-CM

## 2023-06-25 LAB — CBC WITH DIFFERENTIAL/PLATELET
Abs Immature Granulocytes: 0.02 10*3/uL (ref 0.00–0.07)
Basophils Absolute: 0 10*3/uL (ref 0.0–0.1)
Basophils Relative: 1 %
Eosinophils Absolute: 0 10*3/uL (ref 0.0–0.5)
Eosinophils Relative: 1 %
HCT: 32.5 % — ABNORMAL LOW (ref 36.0–46.0)
Hemoglobin: 10.2 g/dL — ABNORMAL LOW (ref 12.0–15.0)
Immature Granulocytes: 0 %
Lymphocytes Relative: 9 %
Lymphs Abs: 0.5 10*3/uL — ABNORMAL LOW (ref 0.7–4.0)
MCH: 29.1 pg (ref 26.0–34.0)
MCHC: 31.4 g/dL (ref 30.0–36.0)
MCV: 92.9 fL (ref 80.0–100.0)
Monocytes Absolute: 0.5 10*3/uL (ref 0.1–1.0)
Monocytes Relative: 9 %
Neutro Abs: 4.2 10*3/uL (ref 1.7–7.7)
Neutrophils Relative %: 80 %
Platelets: 298 10*3/uL (ref 150–400)
RBC: 3.5 MIL/uL — ABNORMAL LOW (ref 3.87–5.11)
RDW: 18.2 % — ABNORMAL HIGH (ref 11.5–15.5)
WBC: 5.2 10*3/uL (ref 4.0–10.5)
nRBC: 0 % (ref 0.0–0.2)

## 2023-06-25 LAB — COMPREHENSIVE METABOLIC PANEL
ALT: 12 U/L (ref 0–44)
AST: 22 U/L (ref 15–41)
Albumin: 3.6 g/dL (ref 3.5–5.0)
Alkaline Phosphatase: 63 U/L (ref 38–126)
Anion gap: 20 — ABNORMAL HIGH (ref 5–15)
BUN: 69 mg/dL — ABNORMAL HIGH (ref 8–23)
CO2: 19 mmol/L — ABNORMAL LOW (ref 22–32)
Calcium: 9 mg/dL (ref 8.9–10.3)
Chloride: 95 mmol/L — ABNORMAL LOW (ref 98–111)
Creatinine, Ser: 13.79 mg/dL — ABNORMAL HIGH (ref 0.44–1.00)
GFR, Estimated: 3 mL/min — ABNORMAL LOW (ref >60)
Glucose, Bld: 104 mg/dL — ABNORMAL HIGH (ref 70–99)
Potassium: 6.4 mmol/L (ref 3.5–5.1)
Sodium: 134 mmol/L — ABNORMAL LOW (ref 135–145)
Total Bilirubin: 0.7 mg/dL (ref 0.0–1.2)
Total Protein: 7.2 g/dL (ref 6.5–8.1)

## 2023-06-25 LAB — PROTIME-INR
INR: 1.1 (ref 0.8–1.2)
Prothrombin Time: 14.5 s (ref 11.4–15.2)

## 2023-06-25 LAB — GLUCOSE, CAPILLARY: Glucose-Capillary: 104 mg/dL — ABNORMAL HIGH (ref 70–99)

## 2023-06-25 LAB — I-STAT CG4 LACTIC ACID, ED
Lactic Acid, Venous: 0.6 mmol/L (ref 0.5–1.9)
Lactic Acid, Venous: 1.9 mmol/L (ref 0.5–1.9)

## 2023-06-25 LAB — AMMONIA: Ammonia: 16 umol/L (ref 9–35)

## 2023-06-25 MED ORDER — HEPARIN SODIUM (PORCINE) 1000 UNIT/ML IJ SOLN
3800.0000 [IU] | Freq: Once | INTRAMUSCULAR | Status: AC
  Administered 2023-06-25: 3800 [IU]
  Filled 2023-06-25: qty 4

## 2023-06-25 MED ORDER — FUROSEMIDE 10 MG/ML IJ SOLN
40.0000 mg | Freq: Once | INTRAMUSCULAR | Status: AC
  Administered 2023-06-25: 40 mg via INTRAVENOUS
  Filled 2023-06-25: qty 4

## 2023-06-25 MED ORDER — SODIUM ZIRCONIUM CYCLOSILICATE 10 G PO PACK
10.0000 g | PACK | Freq: Once | ORAL | Status: AC
  Administered 2023-06-25: 10 g via ORAL
  Filled 2023-06-25: qty 1

## 2023-06-25 NOTE — Progress Notes (Signed)
Received patient in bed to unit.  Alert and oriented.  Informed consent signed and in chart.   TX duration:2.5 hours  Patient became hypotensive bolus 300 cc total was given. Rapid called on patient. Due to hypotention 57/39 blood was returned. Bs checked 104. Transported back to the room  Alert, without acute distress.  Hand-off given to patient's nurse.   Access used: dialysis cath Access issues: none  Total UF removed: 1100nml Medication(s) given: heplock 1.9units per port Post HD VS: see table below Post HD weight: kg   06/25/23 2320  Vitals  BP (!) 150/90  MAP (mmHg) 109  BP Location Left Arm  BP Method Automatic  Patient Position (if appropriate) Lying  Pulse Rate 80  Pulse Rate Source Monitor  ECG Heart Rate (!) 113  Resp (!) 25  Oxygen Therapy  SpO2 90 %  O2 Device Room Air  Patient Activity (if Appropriate) In bed  Pulse Oximetry Type Continuous  During Treatment Monitoring  Blood Flow Rate (mL/min) 0 mL/min  Arterial Pressure (mmHg) -2.63 mmHg  Venous Pressure (mmHg) 35.96 mmHg  TMP (mmHg) 0.61 mmHg  Ultrafiltration Rate (mL/min) 0 mL/min  Dialysate Flow Rate (mL/min) 300 ml/min  Dialysate Potassium Concentration 2  Dialysate Calcium Concentration 25  Duration of HD Treatment -hour(s) 2.7 hour(s)  Cumulative Fluid Removed (mL) per Treatment  1104.43  HD Safety Checks Performed Yes  Intra-Hemodialysis Comments Tx completed  Post Treatment  Dialyzer Clearance Lightly streaked  Hemodialysis Intake (mL) 300 mL  Liters Processed 64.7  Fluid Removed (mL) 1100 mL  Tolerated HD Treatment No (Comment)  Post-Hemodialysis Comments goal not met  Hemodialysis Catheter Right Subclavian Double lumen Permanent (Tunneled)  Placement Date/Time: 05/28/23 0942   Placed prior to admission: No  Serial / Lot #: [id number]  Expiration Date: 04/01/27  Time Out: Correct patient;Correct site;Correct procedure  Maximum sterile barrier precautions: Sterile gloves;Sterile  gown;Mask;C...  Site Condition No complications  Blue Lumen Status Flushed;Heparin locked;Dead end cap in place  Red Lumen Status Flushed;Heparin locked;Dead end cap in place  Purple Lumen Status N/A  Catheter fill solution Heparin 1000 units/ml  Catheter fill volume (Arterial) 1.9 cc  Catheter fill volume (Venous) 1.9  Post treatment catheter status Capped and Clamped      Paralee Cancel [facility]

## 2023-06-25 NOTE — ED Notes (Signed)
Report given to [facility] in Dialysis, stated ready for pt and consent can be signed up in dialysis if pt can be transported up to them

## 2023-06-25 NOTE — ED Notes (Signed)
Patient back to ED from HD unit.

## 2023-06-25 NOTE — ED Provider Notes (Addendum)
Signout received on this 73 year old female who presented for concern of altered mental status and being found on the floor.  She states she tripped over her horseshoes and had been on the floor for about 2 days.  Pending labs at the end of my shift.  Physical Exam  BP (!) 150/90 (BP Location: Left Arm)   Pulse 80   Temp 98.9 F (37.2 C) (Oral)   Resp (!) 25   Wt 44.4 kg   SpO2 90%   BMI 17.34 kg/m     Procedures  .Critical Care  Performed by: Marita Kansas, PA-C Authorized by: Marita Kansas, PA-C   Critical care provider statement:    Critical care time (minutes):  30   Critical care was necessary to treat or prevent imminent or life-threatening deterioration of the following conditions:  Metabolic crisis (hyperkalemia, lokelma, lasix)   Critical care was time spent personally by me on the following activities:  Development of treatment plan with patient or surrogate, discussions with consultants, evaluation of patient's response to treatment, examination of patient, ordering and review of laboratory studies, ordering and review of radiographic studies, ordering and performing treatments and interventions, pulse oximetry, re-evaluation of patient's condition and review of old charts   ED Course / MDM   Clinical Course as of 06/25/23 2342  Fri Jun 25, 2023  1455 Attempt to contact Fayrene Fearing at home unsuccessful at the number provided.  [SU]  1716 Lasix, Lokelma ordered for hyperkalemia.  No EKG changes consistent with acute hyperkalemia.  Discussed with nephrology.  They will set up for dialysis.  MRI ordered as well for slurred speech.  CT head without acute intracranial finding. [AA]  1718 Attempted to reach Fayrene Fearing at the phone number that the patient provides which is [phone].  No answer. [AA]    Clinical Course User Index [AA] Marita Kansas, PA-C [SU] Elpidio Anis, PA-C   Medical Decision Making Amount and/or Complexity of Data Reviewed Labs: ordered. Radiology:  ordered.  Risk Prescription drug management.   CT head without acute concern.  Spoke to sister who provided additional information.  At baseline she does not have any slurring of her speech.  According to patient and sister this is new.  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  She did miss her dialysis yesterday.  She does have hyperkalemia potassium of 6.4 without EKG changes.  Lasix, Lokelma ordered.  No leukocytosis.  MRI brain obtained.  No acute stroke.  Patient on my initial evaluation felt significantly improved and was asking to go home.  We discussed obtaining an MRI and her needing a dialysis session in the emergency department.  She was agreeable.   I initially discussed the case with neurology prior to MRI however they requested to call back after the MRI.  Given there was no acute stroke MRI was not re-consulted.  If she remains improved after dialysis sessions she is appropriate for discharge with outpatient neurology follow-up.       Marita Kansas, PA-C 06/25/23 2345    Marita Kansas, PA-C 06/25/23 2345    Jacalyn Lefevre, MD 06/25/23 2352

## 2023-06-25 NOTE — Significant Event (Signed)
Rapid Response Event Note   Reason for Call :  Unresponsiveness, hypotension(SBP-50s)  HD RN returning blood to pt.   Initial Focused Assessment:  Pt lying in bed with eyes closed, in no visible distress. Stretcher in in trendelenburg position. Pt will respond to painful stimuli but will not speak. Skin warm and dry. After a few minutes, BP and responsiveness increased. Pt was then able to speak, was oriented x 4, and able to move all extremities. Pt has R facial droop and R sided weakness from prior stroke.   HR-99, BP-108/78, RR-22, SpO2-92% on RA.   1200cc was removed during HD.   Interventions:  Blood returned to pt CBG-104  Plan of Care:  Pt to return to ED.  HD RN to notify ED RN of happenings.   Event Summary:   MD Notified:  Call Time:2313 Arrival [phone] End Time:2330  Terrilyn Saver, RN

## 2023-06-25 NOTE — Progress Notes (Addendum)
Pt hypotensive Rapid called BS 104

## 2023-06-25 NOTE — ED Triage Notes (Signed)
Pt arrives ems with c/o head pain.  Level 2 fall on thinners Pt found down after unknown amount of time LKW unknown by roommate. Pt reports taking blood thinners and has previous stroke history with pt reporting no deficits. Pt arrives with right sided weakness and possible right facial droop.  PT arrives GCS 14, disoriented to time and states she starting having garbled speech after her fall, and also hitting her head.

## 2023-06-25 NOTE — ED Provider Notes (Signed)
[place] [facility] AT [facility] Provider Note   CSN: [id number] Arrival date & time: 06/25/23  1406     History  Chief Complaint  Patient presents with   Fall    On thinners     Monique Henderson is a 73 y.o. female.  Patient to ED by EMS, called by Fayrene Fearing (boyfriend) who returned home and found her on the floor lying her right side. Unknown when she fell. She is anticoagulated on Eliquis for A-fib. Fayrene Fearing told EMS she has not been herself for the past 3 days, sleepy, not active. No known fever. She did not go to dialysis yesterday. History of stroke with residual right sided deficits. The patient is tearful, stating "I don't want to be here". She denies chest pain, SOB, abdominal pain, nausea. She reports pain on the right side of her head.  The history is provided by the patient and the EMS personnel. No language interpreter was used.       Home Medications Prior to Admission medications   Medication Sig Start Date End Date Taking? Authorizing Provider  albuterol (PROVENTIL) (2.5 MG/3ML) 0.083% nebulizer solution Take 2.5 mg by nebulization every 6 (six) hours as needed for wheezing or shortness of breath.    [provider]  albuterol (VENTOLIN HFA) 108 (90 Base) MCG/ACT inhaler Inhale 2 puffs into the lungs every 6 (six) hours as needed for wheezing or shortness of breath. 07/30/20   Janeece Agee, NP  amLODipine (NORVASC) 10 MG tablet Take 1 tablet (10 mg total) by mouth daily. 03/06/22   Setzer, Lynnell Jude, PA-C  apixaban (ELIQUIS) 2.5 MG TABS tablet Take 1 tablet (2.5 mg total) by mouth 2 (two) times daily. 03/06/22   Setzer, Lynnell Jude, PA-C  carvedilol (COREG) 25 MG tablet Take 1 tablet (25 mg total) by mouth 2 (two) times daily with a meal. 05/01/22 06/16/24  Ghimire, Werner Lean, MD  Cholecalciferol (VITAMIN D-3 PO) Take 1 capsule by mouth daily.    [provider]  cloNIDine (CATAPRES) 0.1 MG tablet Take 1 tablet (0.1 mg total) by mouth 3  (three) times daily. 06/19/23   Hughie Closs, MD  hydrALAZINE (APRESOLINE) 100 MG tablet Take 1 tablet (100 mg total) by mouth every 8 (eight) hours. 12/18/22   Corky Crafts, MD  isosorbide mononitrate (IMDUR) 30 MG 24 hr tablet TAKE 1 TABLET (30MG ) ON DIALYSIS DAYS AND 2 TABLETS (60 MG) ON NON DIALYSIS DAYS. 01/12/23   Corky Crafts, MD  naloxone [facility]) nasal spray 4 mg/0.1 mL 1 spray as directed. 02/19/22   [provider]  oxyCODONE-acetaminophen (PERCOCET) 10-325 MG tablet Take 1 tablet by mouth in the morning and at bedtime. 03/12/22   [provider]  pantoprazole (PROTONIX) 40 MG tablet TAKE 1 TABLET (40 MG TOTAL) BY MOUTH 2 (TWO) TIMES DAILY. 05/13/23   Georganna Skeans, MD  rosuvastatin (CRESTOR) 10 MG tablet TAKE 1 TABLET (10 MG TOTAL) BY MOUTH DAILY FOR CHOLESTEROL 08/07/22   Georganna Skeans, MD  senna-docusate (SENOKOT-S) 8.6-50 MG tablet Take 1 tablet by mouth 2 (two) times daily between meals as needed for mild constipation. 08/14/22   Almon Hercules, MD  sevelamer carbonate (RENVELA) 2.4 g PACK Take 2.4 g by mouth 3 (three) times daily with meals. 03/06/22   Setzer, Lynnell Jude, PA-C  traZODone (DESYREL) 50 MG tablet Take 25-50 mg by mouth at bedtime as needed for sleep. 03/19/23   [provider]      Allergies  Shrimp [shellfish allergy], Bactroban [mupirocin], Hydrocodone, Chlorhexidine gluconate, Tylenol [acetaminophen], and Zestril [lisinopril]    Review of Systems   Review of Systems  Physical Exam Updated Vital Signs BP (!) 183/111   Pulse 82   Temp 98.3 F (36.8 C) (Oral)   Resp 18   SpO2 99%  Physical Exam Vitals and nursing note reviewed.  Constitutional:      Comments: Chronically ill appearing  HENT:     Head: Normocephalic and atraumatic.     Mouth/Throat:     Mouth: Mucous membranes are moist.  Cardiovascular:     Rate and Rhythm: Normal rate and regular rhythm.     Heart sounds: No murmur heard. Pulmonary:     Effort:  Pulmonary effort is normal.     Breath sounds: No wheezing, rhonchi or rales.     Comments: Poor effort Abdominal:     General: There is no distension.     Palpations: Abdomen is soft.     Tenderness: There is no abdominal tenderness.  Musculoskeletal:     Comments: Able to move all extremities, right side weak as per history.   Neurological:     Mental Status: She is alert.     Comments: Awake, responsive to questions. She appears in NAD. Able to follow command, express opinion, identify Fayrene Fearing as boyfriend.      ED Results / Procedures / Treatments   Labs (all labs ordered are listed, but only abnormal results are displayed) Labs Reviewed  CBC WITH DIFFERENTIAL/PLATELET - Abnormal; Notable for the following components:      Result Value   RBC 3.50 (*)    Hemoglobin 10.2 (*)    HCT 32.5 (*)    RDW 18.2 (*)    Lymphs Abs 0.5 (*)    All other components within normal limits  COMPREHENSIVE METABOLIC PANEL - Abnormal; Notable for the following components:   Sodium 134 (*)    Potassium 6.4 (*)    Chloride 95 (*)    CO2 19 (*)    Glucose, Bld 104 (*)    BUN 69 (*)    Creatinine, Ser 13.79 (*)    GFR, Estimated 3 (*)    Anion gap 20 (*)    All other components within normal limits  PROTIME-INR  AMMONIA  I-STAT CG4 LACTIC ACID, ED  I-STAT CG4 LACTIC ACID, ED   Results for orders placed or performed during the hospital encounter of 06/25/23  CBC with Differential   Collection Time: 06/25/23  2:30 PM  Result Value Ref Range   WBC 5.2 4.0 - 10.5 K/uL   RBC 3.50 (L) 3.87 - 5.11 MIL/uL   Hemoglobin 10.2 (L) 12.0 - 15.0 g/dL   HCT 29.5 (L) 18.8 - 41.6 %   MCV 92.9 80.0 - 100.0 fL   MCH 29.1 26.0 - 34.0 pg   MCHC 31.4 30.0 - 36.0 g/dL   RDW 60.6 (H) 30.1 - 60.1 %   Platelets 298 150 - 400 K/uL   nRBC 0.0 0.0 - 0.2 %   Neutrophils Relative % 80 %   Neutro Abs 4.2 1.7 - 7.7 K/uL   Lymphocytes Relative 9 %   Lymphs Abs 0.5 (L) 0.7 - 4.0 K/uL   Monocytes Relative 9 %    Monocytes Absolute 0.5 0.1 - 1.0 K/uL   Eosinophils Relative 1 %   Eosinophils Absolute 0.0 0.0 - 0.5 K/uL   Basophils Relative 1 %   Basophils Absolute 0.0 0.0 - 0.1 K/uL  Immature Granulocytes 0 %   Abs Immature Granulocytes 0.02 0.00 - 0.07 K/uL  Comprehensive metabolic panel   Collection Time: 06/25/23  2:30 PM  Result Value Ref Range   Sodium 134 (L) 135 - 145 mmol/L   Potassium 6.4 (HH) 3.5 - 5.1 mmol/L   Chloride 95 (L) 98 - 111 mmol/L   CO2 19 (L) 22 - 32 mmol/L   Glucose, Bld 104 (H) 70 - 99 mg/dL   BUN 69 (H) 8 - 23 mg/dL   Creatinine, Ser 16.10 (H) 0.44 - 1.00 mg/dL   Calcium 9.0 8.9 - 96.0 mg/dL   Total Protein 7.2 6.5 - 8.1 g/dL   Albumin 3.6 3.5 - 5.0 g/dL   AST 22 15 - 41 U/L   ALT 12 0 - 44 U/L   Alkaline Phosphatase 63 38 - 126 U/L   Total Bilirubin 0.7 0.0 - 1.2 mg/dL   GFR, Estimated 3 (L) >60 mL/min   Anion gap 20 (H) 5 - 15  Protime-INR   Collection Time: 06/25/23  2:30 PM  Result Value Ref Range   Prothrombin Time 14.5 11.4 - 15.2 seconds   INR 1.1 0.8 - 1.2  I-Stat Lactic Acid   Collection Time: 06/25/23  2:38 PM  Result Value Ref Range   Lactic Acid, Venous 0.6 0.5 - 1.9 mmol/L   *Note: Due to a large number of results and/or encounters for the requested time period, some results have not been displayed. A complete set of results can be found in Results Review.    EKG EKG Interpretation Date/Time:  Friday June 25 2023 14:27:39 EST Ventricular Rate:  83 PR Interval:  183 QRS Duration:  145 QT Interval:  430 QTC Calculation: 506 R Axis:   27  Text Interpretation: Sinus rhythm LAE, consider biatrial enlargement Right bundle branch block Since last tracing rate slower Confirmed by Jacalyn Lefevre [phone]) on 06/25/2023 4:18:25 PM  Radiology CT Head Wo Contrast Result Date: 06/25/2023 CLINICAL DATA:  Mild EXAM: CT HEAD WITHOUT CONTRAST CT CERVICAL SPINE WITHOUT CONTRAST TECHNIQUE: Multidetector CT imaging of the head and cervical spine was  performed following the standard protocol without intravenous contrast. Multiplanar CT image reconstructions of the cervical spine were also generated. RADIATION DOSE REDUCTION: This exam was performed according to the departmental dose-optimization program which includes automated exposure control, adjustment of the mA and/or kV according to patient size and/or use of iterative reconstruction technique. COMPARISON:  None Available. FINDINGS: CT HEAD FINDINGS Brain: Remote appearing high left frontal cortical infarct. Patchy white matter hypodensities are compatible with chronic microvascular ischemic disease. No evidence of acute hemorrhage, mass lesion, midline shift or hydrocephalus. Vascular: No hyperdense vessel.  Calcific atherosclerosis. Skull: No acute fracture. Sinuses/Orbits: Clear sinuses.  No acute orbital findings. Other: No mastoid effusions. CT CERVICAL SPINE FINDINGS Alignment: No substantial sagittal subluxation. Skull base and vertebrae: No acute fracture. No primary bone lesion or focal pathologic process. Soft tissues and spinal canal: No prevertebral fluid or swelling. No visible canal hematoma. Disc levels: Moderate multilevel degenerative change including degenerative disc height loss and bridging anterior osteophytes at multiple levels. Facet and uncovertebral hypertrophy with varying degrees of neural foraminal stenosis. Upper chest: Visualized lung apices are clear.  Emphysema. IMPRESSION: 1. No evidence of acute abnormality intracranially in the cervical spine. 2. Remote appearing high left frontal cortical infarct and chronic microvascular ischemic disease. An MRI could provide more sensitive evaluation for acute infarct if clinically warranted. Electronically Signed   By: Feliberto Harts  M.D.   On: 06/25/2023 14:53   CT Cervical Spine Wo Contrast Result Date: 06/25/2023 CLINICAL DATA:  Mild EXAM: CT HEAD WITHOUT CONTRAST CT CERVICAL SPINE WITHOUT CONTRAST TECHNIQUE: Multidetector CT  imaging of the head and cervical spine was performed following the standard protocol without intravenous contrast. Multiplanar CT image reconstructions of the cervical spine were also generated. RADIATION DOSE REDUCTION: This exam was performed according to the departmental dose-optimization program which includes automated exposure control, adjustment of the mA and/or kV according to patient size and/or use of iterative reconstruction technique. COMPARISON:  None Available. FINDINGS: CT HEAD FINDINGS Brain: Remote appearing high left frontal cortical infarct. Patchy white matter hypodensities are compatible with chronic microvascular ischemic disease. No evidence of acute hemorrhage, mass lesion, midline shift or hydrocephalus. Vascular: No hyperdense vessel.  Calcific atherosclerosis. Skull: No acute fracture. Sinuses/Orbits: Clear sinuses.  No acute orbital findings. Other: No mastoid effusions. CT CERVICAL SPINE FINDINGS Alignment: No substantial sagittal subluxation. Skull base and vertebrae: No acute fracture. No primary bone lesion or focal pathologic process. Soft tissues and spinal canal: No prevertebral fluid or swelling. No visible canal hematoma. Disc levels: Moderate multilevel degenerative change including degenerative disc height loss and bridging anterior osteophytes at multiple levels. Facet and uncovertebral hypertrophy with varying degrees of neural foraminal stenosis. Upper chest: Visualized lung apices are clear.  Emphysema. IMPRESSION: 1. No evidence of acute abnormality intracranially in the cervical spine. 2. Remote appearing high left frontal cortical infarct and chronic microvascular ischemic disease. An MRI could provide more sensitive evaluation for acute infarct if clinically warranted. Electronically Signed   By: Feliberto Harts M.D.   On: 06/25/2023 14:53    Procedures Procedures    Medications Ordered in ED Medications - No data to display  ED Course/ Medical Decision  Making/ A&P Clinical Course as of 06/25/23 1622  Fri Jun 25, 2023  1455 Attempt to contact Fayrene Fearing at home unsuccessful at the number provided.  [SU]    Clinical Course User Index [SU] Elpidio Anis, PA-C                                 Medical Decision Making This patient presents to the ED for concern of fall/weakness, this involves an extensive number of treatment options, and is a complaint that carries with it a high risk of complications and morbidity.  The differential diagnosis includes CVA, sepsis, mechanical fall, head injury   Co morbidities that complicate the patient evaluation  ESRD-HD, Hep C, kidney stones, breast CA (2014), noncompliance, HTN, COPD, AF on Eliquis   Additional history obtained:  Additional history and/or information obtained from chart review, notable for chart review   Lab Tests:  I Ordered, and personally interpreted labs.  The pertinent results include:  Hgb 10.2, no leukocytosis; normal coagulation    Imaging Studies ordered:  I ordered imaging studies including Head and neck CT I independently visualized and interpreted imaging which showed no evidence of injury I agree with the radiologist interpretation   Cardiac Monitoring:  The patient was maintained on a cardiac monitor.  I personally viewed and interpreted the cardiac monitored which showed an underlying rhythm of: EKG without significant change from prior    Problem List / ED Course:  Patient unwell x 2-3 days at home per report of EMS  Missed dialysis Today found on the floor with unknown downtime H/O CVA with residual right sided deficits complicating neuro exam -  patient reports she feels her speech is difficult "garbled" today No acute CVA on CT, however, patient with h/o CVA with subjective neuro symptoms/deficits (speech) - will obtain MRI.    Social Determinants of Health:  Lives with [place]   Disposition:  After consideration of the diagnostic results and  the patients response to treatment, I feel that the patient would benefit from disposition to be determined by oncoming provider team pending MRI, re-evaluation. .   Amount and/or Complexity of Data Reviewed Labs: ordered. Radiology: ordered.           Final Clinical Impression(s) / ED Diagnoses Final diagnoses:  Fall, initial encounter  Speech disturbance, unspecified type  History of stroke    Rx / DC Orders ED Discharge Orders     None         Elpidio Anis, PA-C 06/25/23 1622    Rozelle Logan, DO 07/03/23 [id number]

## 2023-06-25 NOTE — Consult Note (Signed)
Reason for Consult:ESRD Referring Physician: Dr. Particia Nearing  Chief Complaint: Altered mental status  Dialysis Orders:  AF TTS 3.5H 400/A1.5x 2K/2Ca TDC No heparin Mircera 225 q 2 wks (last given on 1/17, due 1/24), new EDW 41kg Hectrol 9 TIW Sensipar 60 three times per week  Assessment/Plan: Hyperkalemia 6.4 -secondary to missed dialysis treatment.  Temporizing with medical regimen in the emergency department but will bring her up for dialysis for definitive treatment.  ESRD -  TTS. Missed HD on Thursday. Left 2.6 kg over EDW.  She is listed at 47 kg but her true dry weight after leaving the hospital a week ago was 41 kg.    Hypertension/volume  - Severe HTN on admit.  Start home regiment and ultrafiltration will help as well.   Follow post HD   Anemia  - Hgb 10.2. No ESA needs currently   Metabolic bone disease -  Stable. Continue home meds as able.   Afib - on BB/Eliquis     HPI: Monique Henderson is an 73 y.o. female with ESRD on HD TTS. Presented from home with dyspnea, hypoxia, severe hypertension. Recent [facility] admission on 1/2 -06/06/23 with pneumonia- completed course of antibiotics and then 1/16-1/18 with similar presentation  with increasing O2 requirement with CXR shows pulmonary edema/pneumonia.  Patient now presenting with altered mental status.  According to the patient she fell after tripping on her high heels and could not get up from the ground.  She is anticoagulated on Eliquis for atrial fibrillation.  She is found by her boyfriend who found her lying on her right side.  According to the boyfriend she has been more somnolent this past few days.  Patient denies any fever chills nausea vomiting, shortness of breath.  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  Patient also denies any palpitations, chest pain, abdominal pain but has some pain on the right side of her head.  Patient denies any loss of  consciousness.   Last dialysis was Tues 1/21 and left at 43.6kg.  She missed treatment on Thursday.   ROS Pertinent items are noted in HPI.  Chemistry and CBC: Creatinine  Date/Time Value Ref Range Status  09/28/2012 08:31 AM 1.2 (H) 0.6 - 1.1 mg/dL Final   Creat  Date/Time Value Ref Range Status  01/18/2017 11:45 AM 2.09 (H) 0.50 - 0.99 mg/dL Final    Comment:      For patients > or = 71 years of age: The upper reference limit for Creatinine is approximately 13% higher for people identified as African-American.     12/09/2016 02:58 PM 2.23 (H) 0.50 - 0.99 mg/dL Final    Comment:      For patients > or = 71 years of age: The upper reference limit for Creatinine is approximately 13% higher for people identified as African-American.     02/14/2014 09:51 AM 1.24 (H) 0.50 - 1.10 mg/dL Final  16/03/9603 54:09 AM 1.52 (H) 0.50 - 1.10 mg/dL Final  81/19/1478 29:56 AM 1.33 (H) 0.50 - 1.10 mg/dL Final  21/30/8657 84:69 AM 1.38 (H) 0.50 - 1.10 mg/dL Final  62/95/2841 32:44 PM 2.44 (H) 0.50 - 1.10 mg/dL Final  06/03/7251 66:44 PM 1.66 (H) 0.50 - 1.10 mg/dL Final  03/47/4259 56:38 AM 1.43 (H) 0.50 - 1.10 mg/dL Final   Creatinine, Ser  Date/Time Value Ref Range Status  06/25/2023 02:30 PM 13.79 (H) 0.44 - 1.00 mg/dL Final  06/19/2023 07:51 AM 9.29 (H) 0.44 - 1.00 mg/dL Final    Comment:    DELTA CHECK NOTED  06/18/2023 03:34 AM 5.77 (H) 0.44 - 1.00 mg/dL Final  16/03/9603 54:09 AM 8.86 (H) 0.44 - 1.00 mg/dL Final  81/19/1478 29:56 AM 10.94 (H) 0.44 - 1.00 mg/dL Final  21/30/8657 84:69 AM 8.83 (H) 0.44 - 1.00 mg/dL Final  62/95/2841 32:44 AM 6.34 (H) 0.44 - 1.00 mg/dL Final  06/03/7251 66:44 AM 8.94 (H) 0.44 - 1.00 mg/dL Final  03/47/4259 56:38 AM 16.81 (H) 0.44 - 1.00 mg/dL Final  75/64/3329 51:88 PM 8.86 (H) 0.44 - 1.00 mg/dL Final  41/66/0630 16:01 AM 14.71 (H) 0.44 - 1.00 mg/dL Final  09/32/3557 32:20 AM 14.57 (H) 0.44 - 1.00 mg/dL Final  25/42/7062 37:62 PM 3.77 (H) 0.44  - 1.00 mg/dL Final  83/15/1761 60:73 AM 6.29 (H) 0.44 - 1.00 mg/dL Final  71/10/2692 85:46 AM 10.52 (H) 0.44 - 1.00 mg/dL Final  27/07/5007 38:18 AM 9.87 (H) 0.44 - 1.00 mg/dL Final  29/93/7169 67:89 AM 10.54 (H) 0.44 - 1.00 mg/dL Final  38/03/1750 02:58 AM 8.44 (H) 0.44 - 1.00 mg/dL Final  52/77/8242 35:36 PM 14.20 (H) 0.44 - 1.00 mg/dL Final  14/43/1540 08:67 PM 12.90 (H) 0.44 - 1.00 mg/dL Final  61/95/0932 67:12 AM 2.95 (H) 0.44 - 1.00 mg/dL Final  45/80/9983 38:25 AM 4.33 (H) 0.44 - 1.00 mg/dL Final    Comment:    DELTA CHECK NOTED  01/15/2023 02:59 AM 1.10 (H) 0.44 - 1.00 mg/dL Final    Comment:    DIALYSIS  01/14/2023 09:34 PM 9.22 (H) 0.44 - 1.00 mg/dL Final  05/39/7673 41:93 PM 8.91 (H) 0.44 - 1.00 mg/dL Final  79/07/4095 35:32 AM 8.62 (H) 0.44 - 1.00 mg/dL Final  99/24/2683 41:96 AM 10.10 (H) 0.44 - 1.00 mg/dL Final  22/29/7989 21:19 AM 18.86 (H) 0.44 - 1.00 mg/dL Final  41/74/0814 48:18 AM 19.00 (H) 0.44 - 1.00 mg/dL Final  56/31/4970 26:37 AM 8.24 (H) 0.44 - 1.00 mg/dL Final  85/88/5027 74:12 AM 5.49 (H) 0.44 - 1.00 mg/dL Final  87/86/7672 09:47 AM 9.97 (H) 0.44 - 1.00 mg/dL Final  09/62/8366 29:47 PM 14.52 (H) 0.44 - 1.00 mg/dL Final  65/46/5035 46:56 AM 5.26 (H) 0.44 - 1.00 mg/dL Final  81/27/5170 01:74 AM 6.53 (H) 0.44 - 1.00 mg/dL Final    Comment:    DIALYSIS  04/29/2022 08:39 PM 15.48 (H) 0.44 - 1.00 mg/dL Final  94/49/6759 16:38 PM 9.82 (H) 0.44 - 1.00 mg/dL Final  46/65/9935 70:17 AM 10.18 (H) 0.44 - 1.00 mg/dL Final  79/39/0300 92:33 AM 7.38 (H) 0.44 - 1.00 mg/dL Final  00/76/2263 33:54 AM 11.35 (H) 0.44 - 1.00 mg/dL Final  56/25/6389 37:34 AM 7.22 (H) 0.44 - 1.00 mg/dL Final  28/76/8115 72:62 AM 7.92 (H) 0.44 - 1.00 mg/dL Final    Comment:    DELTA CHECK NOTED  02/26/2022 02:56 PM 4.56 (H) 0.44 - 1.00 mg/dL Final    Comment:    DIALYSIS DELTA CHECK NOTED   02/24/2022 04:11 AM 10.03 (H) 0.44 - 1.00 mg/dL Final  03/55/9741 63:84 PM 8.96 (H) 0.44 - 1.00  mg/dL Final  53/64/6803 21:22 AM 3.74 (H) 0.44 - 1.00 mg/dL Final    Comment:    DIALYSIS DELTA CHECK NOTED   01/29/2022 11:31 AM 8.80 (H) 0.44 - 1.00 mg/dL Final  48/25/0037 04:88 AM 7.97 (H) 0.44 - 1.00 mg/dL Final  89/16/9450 38:88 AM 9.06 (H) 0.44 - 1.00 mg/dL Final  01/07/2022 03:49 AM 6.44 (H) 0.44 - 1.00 mg/dL Final  16/03/9603 54:09 PM 5.13 (H) 0.44 - 1.00 mg/dL Final    Comment:    DELTA CHECK NOTED  01/06/2022 10:46 AM 12.75 (H) 0.44 - 1.00 mg/dL Final   Recent Labs  Lab 06/19/23 0751 06/25/23 1430  NA 135 134*  K 5.1 6.4*  CL 94* 95*  CO2 26 19*  GLUCOSE 91 104*  BUN 55* 69*  CREATININE 9.29* 13.79*  CALCIUM 8.8* 9.0  PHOS 8.5*  --    Recent Labs  Lab 06/19/23 0751 06/25/23 1430  WBC 3.6* 5.2  NEUTROABS  --  4.2  HGB 8.9* 10.2*  HCT 28.0* 32.5*  MCV 91.2 92.9  PLT 165 298   Liver Function Tests: Recent Labs  Lab 06/19/23 0751 06/25/23 1430  AST  --  22  ALT  --  12  ALKPHOS  --  63  BILITOT  --  0.7  PROT  --  7.2  ALBUMIN 3.3* 3.6   No results for input(s): "LIPASE", "AMYLASE" in the last 168 hours. Recent Labs  Lab 06/25/23 1508  AMMONIA 16   Cardiac Enzymes: No results for input(s): "CKTOTAL", "CKMB", "CKMBINDEX", "TROPONINI" in the last 168 hours. Iron Studies: No results for input(s): "IRON", "TIBC", "TRANSFERRIN", "FERRITIN" in the last 72 hours. PT/INR: @LABRCNTIP (inr:5)  Xrays/Other Studies: ) Results for orders placed or performed during the hospital encounter of 06/25/23 (from the past 48 hours)  CBC with Differential     Status: Abnormal   Collection Time: 06/25/23  2:30 PM  Result Value Ref Range   WBC 5.2 4.0 - 10.5 K/uL   RBC 3.50 (L) 3.87 - 5.11 MIL/uL   Hemoglobin 10.2 (L) 12.0 - 15.0 g/dL   HCT 81.1 (L) 91.4 - 78.2 %   MCV 92.9 80.0 - 100.0 fL   MCH 29.1 26.0 - 34.0 pg   MCHC 31.4 30.0 - 36.0 g/dL   RDW 95.6 (H) 21.3 - 08.6 %   Platelets 298 150 - 400 K/uL   nRBC 0.0 0.0 - 0.2 %   Neutrophils Relative % 80 %    Neutro Abs 4.2 1.7 - 7.7 K/uL   Lymphocytes Relative 9 %   Lymphs Abs 0.5 (L) 0.7 - 4.0 K/uL   Monocytes Relative 9 %   Monocytes Absolute 0.5 0.1 - 1.0 K/uL   Eosinophils Relative 1 %   Eosinophils Absolute 0.0 0.0 - 0.5 K/uL   Basophils Relative 1 %   Basophils Absolute 0.0 0.0 - 0.1 K/uL   Immature Granulocytes 0 %   Abs Immature Granulocytes 0.02 0.00 - 0.07 K/uL    Comment: Performed at [facility], [address] [place], [place] [address]  Comprehensive metabolic panel     Status: Abnormal   Collection Time: 06/25/23  2:30 PM  Result Value Ref Range   Sodium 134 (L) 135 - 145 mmol/L   Potassium 6.4 (HH) 3.5 - 5.1 mmol/L    Comment: CRITICAL RESULT CALLED TO, READ BACK BY AND VERIFIED WITH K,RODGERS RN @1621  06/25/23 E,[place]   Chloride 95 (L) 98 - 111 mmol/L   CO2 19 (L) 22 - 32 mmol/L   Glucose, Bld 104 (H) 70 - 99 mg/dL    Comment: Glucose reference range applies only to samples taken after fasting for at least 8 hours.   BUN 69 (H) 8 - 23 mg/dL   Creatinine, Ser 96.29 (H) 0.44 - 1.00 mg/dL   Calcium 9.0 8.9 -  10.3 mg/dL   Total Protein 7.2 6.5 - 8.1 g/dL   Albumin 3.6 3.5 - 5.0 g/dL   AST 22 15 - 41 U/L   ALT 12 0 - 44 U/L   Alkaline Phosphatase 63 38 - 126 U/L   Total Bilirubin 0.7 0.0 - 1.2 mg/dL   GFR, Estimated 3 (L) >60 mL/min    Comment: (NOTE) Calculated using the CKD-EPI Creatinine Equation (2021)    Anion gap 20 (H) 5 - 15    Comment: Performed at [facility], [address] [place], [place] [address]  Protime-INR     Status: None   Collection Time: 06/25/23  2:30 PM  Result Value Ref Range   Prothrombin Time 14.5 11.4 - 15.2 seconds   INR 1.1 0.8 - 1.2    Comment: (NOTE) INR goal varies based on device and disease states. Performed at [facility], [address], [place], [place] [address]   I-Stat Lactic Acid     Status: None   Collection Time: 06/25/23  2:38 PM  Result Value Ref Range   Lactic Acid, Venous 0.6 0.5 - 1.9  mmol/L  Ammonia     Status: None   Collection Time: 06/25/23  3:08 PM  Result Value Ref Range   Ammonia 16 9 - 35 umol/L    Comment: Performed at [facility], [address], [place], [place] [address]  I-Stat Lactic Acid     Status: None   Collection Time: 06/25/23  5:42 PM  Result Value Ref Range   Lactic Acid, Venous 1.9 0.5 - 1.9 mmol/L   *Note: Due to a large number of results and/or encounters for the requested time period, some results have not been displayed. A complete set of results can be found in Results Review.   DG Hand Complete Right Result Date: 06/25/2023 CLINICAL DATA:  Right hand pain after fall. EXAM: RIGHT HAND - COMPLETE 3+ VIEW COMPARISON:  Right hand x-rays dated June 10, 2006. FINDINGS: There is no evidence of fracture or dislocation. Scattered mild degenerative changes. Osteopenia. Soft tissues are unremarkable. IMPRESSION: 1. No acute osseous abnormality. Electronically Signed   By: Obie Dredge M.D.   On: 06/25/2023 16:48   CT Head Wo Contrast Result Date: 06/25/2023 CLINICAL DATA:  Mild EXAM: CT HEAD WITHOUT CONTRAST CT CERVICAL SPINE WITHOUT CONTRAST TECHNIQUE: Multidetector CT imaging of the head and cervical spine was performed following the standard protocol without intravenous contrast. Multiplanar CT image reconstructions of the cervical spine were also generated. RADIATION DOSE REDUCTION: This exam was performed according to the departmental dose-optimization program which includes automated exposure control, adjustment of the mA and/or kV according to patient size and/or use of iterative reconstruction technique. COMPARISON:  None Available. FINDINGS: CT HEAD FINDINGS Brain: Remote appearing high left frontal cortical infarct. Patchy white matter hypodensities are compatible with chronic microvascular ischemic disease. No evidence of acute hemorrhage, mass lesion, midline shift or hydrocephalus. Vascular: No hyperdense vessel.  Calcific  atherosclerosis. Skull: No acute fracture. Sinuses/Orbits: Clear sinuses.  No acute orbital findings. Other: No mastoid effusions. CT CERVICAL SPINE FINDINGS Alignment: No substantial sagittal subluxation. Skull base and vertebrae: No acute fracture. No primary bone lesion or focal pathologic process. Soft tissues and spinal canal: No prevertebral fluid or swelling. No visible canal hematoma. Disc levels: Moderate multilevel degenerative change including degenerative disc height loss and bridging anterior osteophytes at multiple levels. Facet and uncovertebral hypertrophy with varying degrees of neural foraminal stenosis. Upper  chest: Visualized lung apices are clear.  Emphysema. IMPRESSION: 1. No evidence of acute abnormality intracranially in the cervical spine. 2. Remote appearing high left frontal cortical infarct and chronic microvascular ischemic disease. An MRI could provide more sensitive evaluation for acute infarct if clinically warranted. Electronically Signed   By: Feliberto Harts M.D.   On: 06/25/2023 14:53   CT Cervical Spine Wo Contrast Result Date: 06/25/2023 CLINICAL DATA:  Mild EXAM: CT HEAD WITHOUT CONTRAST CT CERVICAL SPINE WITHOUT CONTRAST TECHNIQUE: Multidetector CT imaging of the head and cervical spine was performed following the standard protocol without intravenous contrast. Multiplanar CT image reconstructions of the cervical spine were also generated. RADIATION DOSE REDUCTION: This exam was performed according to the departmental dose-optimization program which includes automated exposure control, adjustment of the mA and/or kV according to patient size and/or use of iterative reconstruction technique. COMPARISON:  None Available. FINDINGS: CT HEAD FINDINGS Brain: Remote appearing high left frontal cortical infarct. Patchy white matter hypodensities are compatible with chronic microvascular ischemic disease. No evidence of acute hemorrhage, mass lesion, midline shift or  hydrocephalus. Vascular: No hyperdense vessel.  Calcific atherosclerosis. Skull: No acute fracture. Sinuses/Orbits: Clear sinuses.  No acute orbital findings. Other: No mastoid effusions. CT CERVICAL SPINE FINDINGS Alignment: No substantial sagittal subluxation. Skull base and vertebrae: No acute fracture. No primary bone lesion or focal pathologic process. Soft tissues and spinal canal: No prevertebral fluid or swelling. No visible canal hematoma. Disc levels: Moderate multilevel degenerative change including degenerative disc height loss and bridging anterior osteophytes at multiple levels. Facet and uncovertebral hypertrophy with varying degrees of neural foraminal stenosis. Upper chest: Visualized lung apices are clear.  Emphysema. IMPRESSION: 1. No evidence of acute abnormality intracranially in the cervical spine. 2. Remote appearing high left frontal cortical infarct and chronic microvascular ischemic disease. An MRI could provide more sensitive evaluation for acute infarct if clinically warranted. Electronically Signed   By: Feliberto Harts M.D.   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  Chevis Pretty; S/P re-excision of superior and inferior margins 10/27/2012.    Dissection of aorta (HCC) 04/03/2019   ESRD on hemodialysis (HCC) 05/29/2019   Essential hypertension 09/16/2006   GERD 09/16/2006   Hepatitis C    treated and RNA confirmed not detectable 01/2017   Insomnia 03/14/2015   Malnutrition of moderate degree 05/19/2019   Non compliance with medical treatment 12/04/2017   Normocytic anemia    With thrombocytosis   Osteoarthritis    Right ureteral stone 2002   S/P lumbar spinal fusion 01/18/2014   S/P lumbar decompressive laminectomy, fusion, and plating for lumbar spinal stenosis on 05/27/2009 by Dr. Tia Alert.  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 Tia Alert.     Stroke (cerebrum) (HCC)    Stroke (HCC) 02/23/2022   SVT (supraventricular tachycardia) (HCC) 08/28/2019   Tobacco use disorder 04/19/2009   Uterine fibroid    Wears dentures    top    PSH:   Past Surgical History:  Procedure Laterality Date   ANTERIOR LAT LUMBAR FUSION N/A 01/18/2014   Procedure: ANTERIOR LATERAL LUMBAR FUSION LUMBAR TWO-THREE;  Surgeon: Tia Alert, MD;  Location: [facility];  Service: Neurosurgery;  Laterality: N/A;   ANTERIOR LUMBAR FUSION  01/18/2014   AV FISTULA PLACEMENT Left 04/20/2019   Procedure: ARTERIOVENOUS (AV) FISTULA CREATION;  Surgeon: Maeola Harman, MD;  Location: [facility] OR;  Service: Vascular;  Laterality: Left;   BACK SURGERY     BREAST LUMPECTOMY Left 01/2003   BREAST LUMPECTOMY Right 2014   BREAST LUMPECTOMY WITH NEEDLE LOCALIZATION AND AXILLARY SENTINEL LYMPH NODE BX Right 10/13/2012   Procedure: BREAST LUMPECTOMY WITH NEEDLE LOCALIZATION;  Surgeon: Robyne Askew, MD;  Location: [facility];  Service: General;  Laterality: Right;  Right  breast wire localized lumpectomy   DIALYSIS/PERMA CATHETER INSERTION N/A 05/28/2023   Procedure: DIALYSIS/PERMA CATHETER INSERTION;  Surgeon: Ethelene Hal, MD;  Location: [facility];  Service: Cardiovascular;  Laterality: N/A;   INSERTION OF DIALYSIS CATHETER Right 04/20/2019   Procedure: INSERTION OF DIALYSIS CATHETER, right internal jugular;  Surgeon: Maeola Harman, MD;  Location: [facility] OR;  Service: Vascular;  Laterality: Right;   INSERTION OF DIALYSIS CATHETER Right 09/24/2020   Procedure: INSERTION OF TUNNELED DIALYSIS CATHETER;  Surgeon: Larina Earthly, MD;  Location: [facility] OR;  Service: Vascular;  Laterality: Right;   IR FLUORO GUIDE CV LINE RIGHT  09/22/2020   IR THORACENTESIS ASP PLEURAL SPACE W/IMG GUIDE  05/19/2019   IR US GUIDE VASC ACCESS LEFT  09/22/2020   IR US GUIDE VASC ACCESS RIGHT  09/22/2020   IR VENOCAVAGRAM SVC  09/22/2020   LAMINECTOMY  05/27/2009   Lumbar decompressive laminectomy, fusion and plating for lumbar spinal stensosis   LIGATION OF ARTERIOVENOUS  FISTULA Left 09/24/2020   Procedure: LIGATION OF LEFT ARM ARTERIOVENOUS  FISTULA;  Surgeon: Larina Earthly, MD;  Location: [facility] OR;  Service: Vascular;  Laterality: Left;   LUMBAR LAMINECTOMY/DECOMPRESSION MICRODISCECTOMY Left 03/23/2013   Procedure: LUMBAR LAMINECTOMY/DECOMPRESSION MICRODISCECTOMY 1 LEVEL;  Surgeon: Tia Alert, MD;  Location: [facility];  Service: Neurosurgery;  Laterality: Left;  LUMBAR LAMINECTOMY/DECOMPRESSION MICRODISCECTOMY 1 LEVEL   MASTECTOMY, PARTIAL Left 02/26/2003   ; S/P re-excision of cranial and lateral margins 04/19/2003.    RE-EXCISION OF BREAST CANCER,SUPERIOR MARGINS Right 10/27/2012   Procedure: RE-EXCISION OF BREAST CANCER,SUPERIOR and inferior MARGINS;  Surgeon: Robyne Askew, MD;  Location: [facility] OR;  Service: General;  Laterality: Right;   RE-EXCISION OF BREAST LUMPECTOMY Left 04/2003   TEE WITHOUT CARDIOVERSION N/A 04/04/2019   Procedure: Transesophageal Echocardiogram  (Tee);  Surgeon: Linden Dolin, MD;  Location: [facility];  Service: Open Heart Surgery;  Laterality: N/A;   THORACIC AORTIC ANEURYSM REPAIR N/A 04/04/2019   Procedure: THORACIC ASCENDING ANEURYSM REPAIR (AAA)  USING 28 MM X 30 CM HEMASHIELD PLATINUM VASCULAR GRAFT;  Surgeon: Linden Dolin, MD;  Location: [facility] OR;  Service: Open Heart Surgery;  Laterality:  N/A;    Allergies:  Allergies  Allergen Reactions   Shrimp [Shellfish Allergy] Shortness Of Breath   Bactroban [Mupirocin] Other (See Comments)    "Sores in nose"   Hydrocodone Itching, Nausea Only and Nausea And Vomiting   Chlorhexidine Gluconate Itching   Tylenol [Acetaminophen] Itching    Takes Percocet at home 05/06/23   Zestril [Lisinopril] Cough    Medications:   Prior to Admission medications   Medication Sig Start Date End Date Taking? Authorizing Provider  albuterol (PROVENTIL) (2.5 MG/3ML) 0.083% nebulizer solution Take 2.5 mg by nebulization every 6 (six) hours as needed for wheezing or shortness of breath.    [provider]  albuterol (VENTOLIN HFA) 108 (90 Base) MCG/ACT inhaler Inhale 2 puffs into the lungs every 6 (six) hours as needed for wheezing or shortness of breath. 07/30/20   Janeece Agee, NP  amLODipine (NORVASC) 10 MG tablet Take 1 tablet (10 mg total) by mouth daily. 03/06/22   Setzer, Lynnell Jude, PA-C  apixaban (ELIQUIS) 2.5 MG TABS tablet Take 1 tablet (2.5 mg total) by mouth 2 (two) times daily. 03/06/22   Setzer, Lynnell Jude, PA-C  carvedilol (COREG) 25 MG tablet Take 1 tablet (25 mg total) by mouth 2 (two) times daily with a meal. 05/01/22 06/16/24  Ghimire, Werner Lean, MD  Cholecalciferol (VITAMIN D-3 PO) Take 1 capsule by mouth daily.    [provider]  cloNIDine (CATAPRES) 0.1 MG tablet Take 1 tablet (0.1 mg total) by mouth 3 (three) times daily. 06/19/23   Hughie Closs, MD  hydrALAZINE (APRESOLINE) 100 MG tablet Take 1 tablet (100 mg total) by mouth every 8 (eight) hours. 12/18/22   Corky Crafts, MD  isosorbide mononitrate (IMDUR) 30 MG 24 hr tablet TAKE 1 TABLET (30MG ) ON DIALYSIS DAYS AND 2 TABLETS (60 MG) ON NON DIALYSIS DAYS. 01/12/23   Corky Crafts, MD  naloxone [facility]) nasal spray 4 mg/0.1 mL 1 spray as directed. 02/19/22   [provider]  oxyCODONE-acetaminophen (PERCOCET) 10-325 MG tablet Take 1 tablet by mouth in the morning and at bedtime. 03/12/22   [provider]  pantoprazole (PROTONIX) 40 MG tablet TAKE 1 TABLET (40 MG TOTAL) BY MOUTH 2 (TWO) TIMES DAILY. 05/13/23   Georganna Skeans, MD  rosuvastatin (CRESTOR) 10 MG tablet TAKE 1 TABLET (10 MG TOTAL) BY MOUTH DAILY FOR CHOLESTEROL 08/07/22   Georganna Skeans, MD  senna-docusate (SENOKOT-S) 8.6-50 MG tablet Take 1 tablet by mouth 2 (two) times daily between meals as needed for mild constipation. 08/14/22   Almon Hercules, MD  sevelamer carbonate (RENVELA) 2.4 g PACK Take 2.4 g by mouth 3 (three) times daily with meals. 03/06/22   Setzer, Lynnell Jude, PA-C  traZODone (DESYREL) 50 MG tablet Take 25-50 mg by mouth at bedtime as needed for sleep. 03/19/23   [provider]    Discontinued Meds:  There are no discontinued medications.  Social History:  reports that she quit smoking about 3 years ago. Her smoking use included cigarettes. She started smoking about 47 years ago. She has a 11 pack-year smoking history. She has never used smokeless tobacco. She reports that she does not currently use alcohol after a past usage of about 2.0 standard drinks of alcohol per week. She reports that she does not currently use drugs after having used the following drugs: Cocaine.  Family History:   Family History  Problem Relation Age of Onset   Colon cancer Mother 44   Hypertension Mother  Diabetes Sister 29   Hypertension Sister    Diabetes Brother    Hypertension Brother    Diabetes Brother    Hypertension Brother    Kidney disease Son        s/p renal transplant   Hypertension Son    Diabetes  Son    Multiple sclerosis Son    Bone cancer Sister 70   Breast cancer Neg Hx    Cervical cancer Neg Hx     Blood pressure (!) 193/103, pulse 81, temperature 98.3 F (36.8 C), temperature source Oral, resp. rate 18, SpO2 95%. General: Uncomfortable on BiPAP Head: NCAT sclera not icteric MMM Neck: Supple.  Lungs: Diminished at bases  Heart: RRR, no murmur, rub, or gallop  Abdomen: soft non-tender, no masses  Lower extremities:without edema or ischemic changes, no open wounds  Neuro: A & O X 3. Moves all extremities spontaneously. Psych:  Responds to questions appropriately with a normal affect. Dialysis Access: R internal jugular TDC        Roya Gieselman, Len Blalock, MD 06/25/2023, 5:46 PM

## 2023-06-25 NOTE — ED Notes (Signed)
PT to CT with RN on cardiac on O2 monitor

## 2023-06-26 ENCOUNTER — Other Ambulatory Visit: Payer: Self-pay

## 2023-06-26 ENCOUNTER — Encounter (HOSPITAL_COMMUNITY): Payer: Self-pay | Admitting: Internal Medicine

## 2023-06-26 DIAGNOSIS — I4891 Unspecified atrial fibrillation: Secondary | ICD-10-CM | POA: Diagnosis present

## 2023-06-26 DIAGNOSIS — N186 End stage renal disease: Secondary | ICD-10-CM | POA: Diagnosis present

## 2023-06-26 DIAGNOSIS — Z853 Personal history of malignant neoplasm of breast: Secondary | ICD-10-CM | POA: Diagnosis not present

## 2023-06-26 DIAGNOSIS — E871 Hypo-osmolality and hyponatremia: Secondary | ICD-10-CM | POA: Diagnosis present

## 2023-06-26 DIAGNOSIS — I480 Paroxysmal atrial fibrillation: Secondary | ICD-10-CM | POA: Diagnosis present

## 2023-06-26 DIAGNOSIS — Z66 Do not resuscitate: Secondary | ICD-10-CM | POA: Diagnosis present

## 2023-06-26 DIAGNOSIS — Z8673 Personal history of transient ischemic attack (TIA), and cerebral infarction without residual deficits: Secondary | ICD-10-CM | POA: Diagnosis present

## 2023-06-26 DIAGNOSIS — Z7901 Long term (current) use of anticoagulants: Secondary | ICD-10-CM | POA: Diagnosis not present

## 2023-06-26 DIAGNOSIS — E872 Acidosis, unspecified: Secondary | ICD-10-CM | POA: Diagnosis present

## 2023-06-26 DIAGNOSIS — I5042 Chronic combined systolic (congestive) and diastolic (congestive) heart failure: Secondary | ICD-10-CM | POA: Diagnosis present

## 2023-06-26 DIAGNOSIS — Z992 Dependence on renal dialysis: Secondary | ICD-10-CM | POA: Diagnosis not present

## 2023-06-26 DIAGNOSIS — Z981 Arthrodesis status: Secondary | ICD-10-CM | POA: Diagnosis not present

## 2023-06-26 DIAGNOSIS — E875 Hyperkalemia: Secondary | ICD-10-CM | POA: Diagnosis present

## 2023-06-26 DIAGNOSIS — E785 Hyperlipidemia, unspecified: Secondary | ICD-10-CM | POA: Diagnosis present

## 2023-06-26 DIAGNOSIS — Z87891 Personal history of nicotine dependence: Secondary | ICD-10-CM | POA: Diagnosis not present

## 2023-06-26 DIAGNOSIS — Z820 Family history of epilepsy and other diseases of the nervous system: Secondary | ICD-10-CM | POA: Diagnosis not present

## 2023-06-26 DIAGNOSIS — Z886 Allergy status to analgesic agent status: Secondary | ICD-10-CM | POA: Diagnosis not present

## 2023-06-26 DIAGNOSIS — J449 Chronic obstructive pulmonary disease, unspecified: Secondary | ICD-10-CM | POA: Diagnosis present

## 2023-06-26 DIAGNOSIS — Z833 Family history of diabetes mellitus: Secondary | ICD-10-CM | POA: Diagnosis not present

## 2023-06-26 DIAGNOSIS — D631 Anemia in chronic kidney disease: Secondary | ICD-10-CM | POA: Diagnosis present

## 2023-06-26 DIAGNOSIS — Z91013 Allergy to seafood: Secondary | ICD-10-CM | POA: Diagnosis not present

## 2023-06-26 DIAGNOSIS — I132 Hypertensive heart and chronic kidney disease with heart failure and with stage 5 chronic kidney disease, or end stage renal disease: Secondary | ICD-10-CM | POA: Diagnosis present

## 2023-06-26 DIAGNOSIS — Z91158 Patient's noncompliance with renal dialysis for other reason: Secondary | ICD-10-CM | POA: Diagnosis not present

## 2023-06-26 DIAGNOSIS — Z8249 Family history of ischemic heart disease and other diseases of the circulatory system: Secondary | ICD-10-CM | POA: Diagnosis not present

## 2023-06-26 DIAGNOSIS — Z800 Family history of malignant neoplasm of digestive organs: Secondary | ICD-10-CM | POA: Diagnosis not present

## 2023-06-26 DIAGNOSIS — Z885 Allergy status to narcotic agent status: Secondary | ICD-10-CM | POA: Diagnosis not present

## 2023-06-26 LAB — CBC
HCT: 31.3 % — ABNORMAL LOW (ref 36.0–46.0)
Hemoglobin: 9.6 g/dL — ABNORMAL LOW (ref 12.0–15.0)
MCH: 28.7 pg (ref 26.0–34.0)
MCHC: 30.7 g/dL (ref 30.0–36.0)
MCV: 93.7 fL (ref 80.0–100.0)
Platelets: 251 10*3/uL (ref 150–400)
RBC: 3.34 MIL/uL — ABNORMAL LOW (ref 3.87–5.11)
RDW: 18.1 % — ABNORMAL HIGH (ref 11.5–15.5)
WBC: 3.8 10*3/uL — ABNORMAL LOW (ref 4.0–10.5)
nRBC: 0 % (ref 0.0–0.2)

## 2023-06-26 LAB — RENAL FUNCTION PANEL
Albumin: 3 g/dL — ABNORMAL LOW (ref 3.5–5.0)
Anion gap: 15 (ref 5–15)
BUN: 46 mg/dL — ABNORMAL HIGH (ref 8–23)
CO2: 25 mmol/L (ref 22–32)
Calcium: 8.4 mg/dL — ABNORMAL LOW (ref 8.9–10.3)
Chloride: 94 mmol/L — ABNORMAL LOW (ref 98–111)
Creatinine, Ser: 8.57 mg/dL — ABNORMAL HIGH (ref 0.44–1.00)
GFR, Estimated: 5 mL/min — ABNORMAL LOW (ref >60)
Glucose, Bld: 142 mg/dL — ABNORMAL HIGH (ref 70–99)
Phosphorus: 7.9 mg/dL — ABNORMAL HIGH (ref 2.5–4.6)
Potassium: 4.4 mmol/L (ref 3.5–5.1)
Sodium: 134 mmol/L — ABNORMAL LOW (ref 135–145)

## 2023-06-26 LAB — HEPATITIS B SURFACE ANTIGEN: Hepatitis B Surface Ag: NONREACTIVE

## 2023-06-26 MED ORDER — ORAL CARE MOUTH RINSE: 15.0000 mL | OROMUCOSAL | Status: DC | PRN

## 2023-06-26 MED ORDER — PROCHLORPERAZINE EDISYLATE 10 MG/2ML IJ SOLN: 5.0000 mg | Freq: Four times a day (QID) | INTRAMUSCULAR | Status: DC | PRN

## 2023-06-26 MED ORDER — DILTIAZEM HCL-DEXTROSE 125-5 MG/125ML-% IV SOLN (PREMIX)
2.5000 mg/h | INTRAVENOUS | Status: DC
  Administered 2023-06-26: 5 mg/h via INTRAVENOUS
  Filled 2023-06-26: qty 125

## 2023-06-26 MED ORDER — POLYETHYLENE GLYCOL 3350 17 G PO PACK: 17.0000 g | PACK | Freq: Every day | ORAL | Status: DC | PRN

## 2023-06-26 MED ORDER — CARVEDILOL 12.5 MG PO TABS
25.0000 mg | ORAL_TABLET | Freq: Once | ORAL | Status: AC
Start: 2023-06-26 — End: 2023-06-26
  Administered 2023-06-26: 25 mg via ORAL
  Filled 2023-06-26: qty 2

## 2023-06-26 MED ORDER — SODIUM CHLORIDE 0.9 % IV BOLUS
500.0000 mL | Freq: Once | INTRAVENOUS | Status: AC
  Administered 2023-06-26: 500 mL via INTRAVENOUS

## 2023-06-26 MED ORDER — MELATONIN 5 MG PO TABS
5.0000 mg | ORAL_TABLET | Freq: Every evening | ORAL | Status: DC | PRN
  Filled 2023-06-26: qty 1

## 2023-06-26 MED ORDER — ACETAMINOPHEN 325 MG PO TABS: 650.0000 mg | ORAL_TABLET | Freq: Four times a day (QID) | ORAL | Status: DC | PRN

## 2023-06-26 MED ORDER — APIXABAN 2.5 MG PO TABS
2.5000 mg | ORAL_TABLET | Freq: Two times a day (BID) | ORAL | Status: DC
Start: 2023-06-26 — End: 2023-06-27
  Administered 2023-06-26 – 2023-06-27 (×2): 2.5 mg via ORAL
  Filled 2023-06-26 (×2): qty 1

## 2023-06-26 MED ORDER — CARVEDILOL 3.125 MG PO TABS
3.1250 mg | ORAL_TABLET | Freq: Two times a day (BID) | ORAL | Status: DC
  Administered 2023-06-26 – 2023-06-27 (×3): 3.125 mg via ORAL
  Filled 2023-06-26 (×3): qty 1

## 2023-06-26 MED ORDER — NEPRO/CARBSTEADY PO LIQD: 237.0000 mL | Freq: Two times a day (BID) | ORAL | Status: DC

## 2023-06-26 MED ORDER — SODIUM CHLORIDE 0.9 % IV BOLUS: 500.0000 mL | Freq: Once | INTRAVENOUS | Status: DC

## 2023-06-26 NOTE — Hospital Course (Signed)
Same day note  Monique Henderson is a 73 y.o. female with past medical history significant for ESRD on HD TTS, chronic A-fib on Eliquis, hypertension, hyperlipidemia, COPD, history of right breast cancer status post post surgery in 2014, GERD, hepatitis C treated in 2015, moderate protein calorie malnutrition , history of stroke with right-sided deficits, chronic lower back pain status post lumbar spinal fusion surgery in 2010, presented to hospital after being found down for more than 2 days missing hemodialysis sessions.  In the ED patient was tachycardic tachypneic and serum potassium was more than 6 and was urgently taken to hemodialysis.  She was also noted to be in atrial fibrillation with RVR and Cardizem drip was initiated.  Initial labs were notable for tachypnea and pulse ox of 96% on room air with tachycardia up to 148.  Labs were notable for mild hyponatremia with sodium of 134 and hyperkalemia with potassium of 6.4 and serum bicarb was low at 19 with serum creatinine at 15.7.   Patient seen and examined at bedside.  Patient was admitted to the hospital for found down on the floor.  At the time of my evaluation, patient complains of  Physical examination reveals  Laboratory data and imaging was reviewed  Assessment and Plan.  Chronic atrial fibrillation with RVR Patient was started on Cardizem drip in the ED initially, subsequently started on Coreg.  Continue Eliquis.   Hyperkalemia secondary to missed hemodialysis. Treated with Lokelma and hemodialysis check BMP.   Mild non-anion gap metabolic acidosis in the setting of missing hemodialysis Anion gap 20 and serum bicarb 19 on presentation.  Continue hemodialysis.   History of CVA with right-sided deficit Continue fall precautions, PT OT.  Continue Eliquis.   Hypertension Has been started on Coreg.  Blood pressure stable at this time.  Will continue to monitor.   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 Volume status managed with hemodialysis.  Strict intake and output charting Daily weights   Generalized weakness and fall PT OT assessment Fall precautions  No Charge  Signed,  Tenny Craw, MD [facility]

## 2023-06-26 NOTE — Progress Notes (Addendum)
Same day note  Monique Henderson is a 73 y.o. female with past medical history significant for ESRD on HD TTS, chronic A-fib on Eliquis, hypertension, hyperlipidemia, COPD, history of right breast cancer status post post surgery in 2014, GERD, hepatitis C treated in 2015, moderate protein calorie malnutrition , history of stroke with right-sided deficits, chronic lower back pain status post lumbar spinal fusion surgery in 2010, presented to hospital after being found down for more than 2 days missing hemodialysis sessions.  In the ED patient was tachycardic tachypneic and serum potassium was more than 6 and was urgently taken to hemodialysis.  She was also noted to be in atrial fibrillation with RVR and Cardizem drip was initiated.  Initial labs were notable for tachypnea and pulse ox of 96% on room air with tachycardia up to 148.  Labs were notable for mild hyponatremia with sodium of 134 and hyperkalemia with potassium of 6.4 and serum bicarb was low at 19 with serum creatinine at 15.7.   Patient seen and examined at bedside.  Patient was admitted to the hospital for found down on the floor.  Patient denies any dizziness lightheadedness or passing out spell prior to fall.  States that the she tripped and fell.  At the time of my evaluation, patient complains of no pain, nausea no chest pain or shortness of breath.  Physical examination reveals elderly female alert awake and Communicative.  Stated that she did have a mechanical fall.  Laboratory data and imaging was reviewed  Assessment and Plan.  Chronic atrial fibrillation with RVR Patient was started on Cardizem drip in the ED initially, subsequently started on Coreg.  Continue Eliquis.  Currently rate has improved.   Hyperkalemia secondary to missed hemodialysis. Treated with Lokelma and hemodialysis, repeat BMP as potassium of 4.5.   Mild non-anion gap metabolic acidosis in the setting of missing hemodialysis Anion gap 20 and serum  bicarb 19 on presentation.  Continue hemodialysis.   History of CVA with right-sided deficit Continue fall precautions, PT OT.  Continue Eliquis.   Hypertension Has been started on Coreg.  Blood pressure stable at this time.  Will continue to monitor.   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 Volume status managed with hemodialysis.  Strict intake and output charting Daily weights   Generalized weakness and fall PT OT assessment.  CT scan of the head and MRI of the brain was negative for acute findings. Continue fall precautions  Patient lives by herself at home but sister checks on her who is nearby.  Has a boyfriend who also checks on her.  Spoke with the patient's sister on the phone and updated her about the clinical condition of the patient.  No Charge  Signed,  Tenny Craw, MD [facility]

## 2023-06-26 NOTE — ED Provider Notes (Signed)
I assumed care of this patient from previous provider.  Please see their note for further details of history, exam, and MDM.   Briefly patient is a 73 y.o. female who presented after being found down by her boyfriend altered, she was brought in as a level 2 trauma for a fall on blood thinners.  Imaging was negative.  Altered mental status had improved.  No stroke noted on MRI.  Blood work revealed evidence of hyperkalemia since patient missed her dialysis because of being down for 2 days.  Patient went to dialysis and returns for reevaluation.  On my exam, patient is sleepy but oriented x 4.  She has noted to to be mildly tachycardic with rates in the low 100-120s in A-fib.  Given her prolonged downtime, patient likely missed her Eliquis as well as her Coreg.  Will give her Coreg and reevaluate.  Patient was noted to be significantly tachycardic with A-fib RVR and rates in the 140s after getting up going to the restroom.  Her blood pressures trended down with systolics in the 90s.  Patient is not a candidate for cardioversion at this time.  Small IV fluid bolus given and patient repositioned.  Blood pressures in the low 100s now.  Patient persisted with heart rates in the 130s-140s.  Started on dill bolus.  Will admit to hospitalist service for further management.  Marland Kitchen1-3 Lead EKG Interpretation  Performed by: Nira Conn, MD Authorized by: Nira Conn, MD     Interpretation: abnormal     ECG rate:  100-140s   ECG rate assessment: tachycardic     Rhythm: atrial fibrillation     Ectopy: none     Conduction: normal   .Critical Care  Performed by: Nira Conn, MD Authorized by: Nira Conn, MD   Critical care provider statement:    Critical care time (minutes):  30   Critical care was necessary to treat or prevent imminent or life-threatening deterioration of the following conditions:  Cardiac failure   Critical care was time spent personally by me on  the following activities:  Development of treatment plan with patient or surrogate, discussions with consultants, evaluation of patient's response to treatment, examination of patient, ordering and review of laboratory studies, ordering and review of radiographic studies, ordering and performing treatments and interventions, pulse oximetry, re-evaluation of patient's condition and review of old charts   Care discussed with: admitting provider          Nira Conn, MD 06/26/23 [phone]

## 2023-06-26 NOTE — ED Notes (Signed)
HR increasing to 140s (ST on CCM); Dr. Floreen Comber made aware and at bedside

## 2023-06-26 NOTE — ED Notes (Signed)
A-flutter now noted on CCM - rate of 90-110s

## 2023-06-26 NOTE — H&P (Addendum)
History and Physical  Monique Henderson:[id number] DOB: 1950/11/20 DOA: 06/25/2023  Referring physician: Dr. Eudelia Bunch, EDP  PCP: Fleet Contras, MD  Outpatient Specialists: Cardiology, nephrology. Patient coming from: Home  Chief Complaint: Found down.  HPI: Monique Henderson is a 73 y.o. female with medical history significant for ESRD on HD TTS, chronic A-fib on Eliquis, hypertension, hyperlipidemia, COPD, history of right breast cancer status post post lung ectomy in 2014, GERD, hepatitis C treated in 2015, moderate protein, nutrition, history of stroke with right-sided deficits, chronic lower back pain status post lumbar spinal fusion surgery in 2010, who presented to the ER from home after being found down.  Reportedly she was on the ground for more than 2 days which caused her to miss home hemodialysis sessions.  In the ER, tachycardic and tachypneic with serum potassium greater than 6 the patient was taken to hemodialysis and when she returned to the ER she was noted to be in A-fib with RVR.  Cardizem drip was initiated.  TRH, hospitalist service was asked to admit.  ED Course: Temperature 98.8.  BP 149/63, pulse 148, respiratory 24, O2 saturation 96% on room air.  Lab studies notable for serum sodium 134, potassium 6.4, serum bicarb 19, BUN 69, creatinine 15.7.  Hemoglobin 10.2, WBC 5.2, platelet count 298.  Review of Systems: Review of systems as noted in the HPI. All other systems reviewed and are negative.   Past Medical History:  Diagnosis Date   Acute cardioembolic stroke (HCC) 02/26/2022   AF (paroxysmal atrial fibrillation) (HCC) 05/29/2019   on Coumadin   Aortic atherosclerosis (HCC) 07/05/2019   Aortic dissection (HCC) 04/04/2019   s/p repair   Bone spur 2008   Right calcaneal foot spur   Breast cancer (HCC) 2004   Ductal carcinoma in situ of the left breast; S/P left partial mastectomy 02/26/2003; S/P re-excision of cranial and lateral margins11/18/2004.radiation    Carotid artery disease (HCC)    Cerebral thrombosis with cerebral infarction 05/22/2019   Chronic HFrEF (heart failure with reduced ejection fraction) (HCC)    Chronic low back pain 06/22/2016   Chronic obstructive lung disease (HCC) 01/16/2017   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 Chevis Pretty; S/P re-excision of superior and inferior margins 10/27/2012.    Dissection of aorta (HCC) 04/03/2019   ESRD on hemodialysis (HCC) 05/29/2019   Essential hypertension 09/16/2006   GERD 09/16/2006   Hepatitis C    treated and RNA confirmed not detectable 01/2017   Insomnia 03/14/2015   Malnutrition of moderate degree 05/19/2019   Non compliance with medical treatment 12/04/2017   Normocytic anemia    With thrombocytosis   Osteoarthritis    Right ureteral stone 2002   S/P lumbar spinal fusion 01/18/2014   S/P lumbar decompressive laminectomy, fusion, and plating for lumbar spinal stenosis on 05/27/2009 by Dr. Tia Alert.  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 Tia Alert.     Stroke (cerebrum) [facility])    Stroke (HCC) 02/23/2022   SVT (supraventricular tachycardia) (HCC) 08/28/2019   Tobacco use disorder 04/19/2009   Uterine fibroid    Wears dentures    top   Past Surgical History:  Procedure Laterality Date   ANTERIOR LAT LUMBAR FUSION N/A 01/18/2014   Procedure: ANTERIOR LATERAL LUMBAR  FUSION LUMBAR TWO-THREE;  Surgeon: Tia Alert, MD;  Location: [facility];  Service: Neurosurgery;  Laterality: N/A;   ANTERIOR LUMBAR FUSION  01/18/2014   AV FISTULA PLACEMENT Left 04/20/2019   Procedure: ARTERIOVENOUS (AV) FISTULA CREATION;  Surgeon: Maeola Harman, MD;  Location: [facility] OR;  Service: Vascular;  Laterality: Left;   BACK  SURGERY     BREAST LUMPECTOMY Left 01/2003   BREAST LUMPECTOMY Right 2014   BREAST LUMPECTOMY WITH NEEDLE LOCALIZATION AND AXILLARY SENTINEL LYMPH NODE BX Right 10/13/2012   Procedure: BREAST LUMPECTOMY WITH NEEDLE LOCALIZATION;  Surgeon: Robyne Askew, MD;  Location: [facility];  Service: General;  Laterality: Right;  Right breast wire localized lumpectomy   DIALYSIS/PERMA CATHETER INSERTION N/A 05/28/2023   Procedure: DIALYSIS/PERMA CATHETER INSERTION;  Surgeon: Ethelene Hal, MD;  Location: [facility];  Service: Cardiovascular;  Laterality: N/A;   INSERTION OF DIALYSIS CATHETER Right 04/20/2019   Procedure: INSERTION OF DIALYSIS CATHETER, right internal jugular;  Surgeon: Maeola Harman, MD;  Location: [facility] OR;  Service: Vascular;  Laterality: Right;   INSERTION OF DIALYSIS CATHETER Right 09/24/2020   Procedure: INSERTION OF TUNNELED DIALYSIS CATHETER;  Surgeon: Larina Earthly, MD;  Location: [facility] OR;  Service: Vascular;  Laterality: Right;   IR FLUORO GUIDE CV LINE RIGHT  09/22/2020   IR THORACENTESIS ASP PLEURAL SPACE W/IMG GUIDE  05/19/2019   IR US GUIDE VASC ACCESS LEFT  09/22/2020   IR US GUIDE VASC ACCESS RIGHT  09/22/2020   IR VENOCAVAGRAM SVC  09/22/2020   LAMINECTOMY  05/27/2009   Lumbar decompressive laminectomy, fusion and plating for lumbar spinal stensosis   LIGATION OF ARTERIOVENOUS  FISTULA Left 09/24/2020   Procedure: LIGATION OF LEFT ARM ARTERIOVENOUS  FISTULA;  Surgeon: Larina Earthly, MD;  Location: [facility] OR;  Service: Vascular;  Laterality: Left;   LUMBAR LAMINECTOMY/DECOMPRESSION MICRODISCECTOMY Left 03/23/2013   Procedure: LUMBAR LAMINECTOMY/DECOMPRESSION MICRODISCECTOMY 1 LEVEL;  Surgeon: Tia Alert, MD;  Location: [facility];  Service: Neurosurgery;  Laterality: Left;  LUMBAR LAMINECTOMY/DECOMPRESSION MICRODISCECTOMY 1 LEVEL   MASTECTOMY, PARTIAL Left 02/26/2003   ; S/P re-excision of cranial and lateral margins 04/19/2003.    RE-EXCISION OF  BREAST CANCER,SUPERIOR MARGINS Right 10/27/2012   Procedure: RE-EXCISION OF BREAST CANCER,SUPERIOR and inferior MARGINS;  Surgeon: Robyne Askew, MD;  Location[facility] OR;  Service: General;  Laterality: Right;   RE-EXCISION OF BREAST LUMPECTOMY Left 04/2003   TEE WITHOUT CARDIOVERSION N/A 04/04/2019   Procedure: Transesophageal Echocardiogram (Tee);  Surgeon: Linden Dolin, MD;  Location: [facility] OR;  Service: Open Heart Surgery;  Laterality: N/A;   THORACIC AORTIC ANEURYSM REPAIR N/A 04/04/2019   Procedure: THORACIC ASCENDING ANEURYSM REPAIR (AAA)  USING 28 MM X 30 CM HEMASHIELD PLATINUM VASCULAR GRAFT;  Surgeon: Linden Dolin, MD;  Location: [facility] OR;  Service: Open Heart Surgery;  Laterality: N/A;    Social History:  reports that she quit smoking about 3 years ago. Her smoking use included cigarettes. She started smoking about 47 years ago. She has a 11 pack-year smoking history. She has never used smokeless tobacco. She reports that she does not currently use alcohol after a past usage of about 2.0 standard drinks of alcohol per week. She reports that she does not currently use drugs after having used the following drugs: Cocaine.   Allergies  Allergen Reactions   Shrimp [Shellfish Allergy] Shortness Of Breath   Bactroban [Mupirocin] Other (See Comments)    "  Sores in nose"   Hydrocodone Itching, Nausea Only and Nausea And Vomiting   Chlorhexidine Gluconate Itching   Tylenol [Acetaminophen] Itching    Takes Percocet at home 05/06/23   Zestril [Lisinopril] Cough    Family History  Problem Relation Age of Onset   Colon cancer Mother 63   Hypertension Mother    Diabetes Sister 62   Hypertension Sister    Diabetes Brother    Hypertension Brother    Diabetes Brother    Hypertension Brother    Kidney disease Son        s/p renal transplant   Hypertension Son    Diabetes Son    Multiple sclerosis Son    Bone cancer Sister 3   Breast cancer Neg Hx    Cervical cancer Neg Hx        Prior to Admission medications   Medication Sig Start Date End Date Taking? Authorizing Provider  albuterol (PROVENTIL) (2.5 MG/3ML) 0.083% nebulizer solution Take 2.5 mg by nebulization every 6 (six) hours as needed for wheezing or shortness of breath.    [provider]  albuterol (VENTOLIN HFA) 108 (90 Base) MCG/ACT inhaler Inhale 2 puffs into the lungs every 6 (six) hours as needed for wheezing or shortness of breath. 07/30/20   Janeece Agee, NP  amLODipine (NORVASC) 10 MG tablet Take 1 tablet (10 mg total) by mouth daily. 03/06/22   Setzer, Lynnell Jude, PA-C  apixaban (ELIQUIS) 2.5 MG TABS tablet Take 1 tablet (2.5 mg total) by mouth 2 (two) times daily. 03/06/22   Setzer, Lynnell Jude, PA-C  carvedilol (COREG) 25 MG tablet Take 1 tablet (25 mg total) by mouth 2 (two) times daily with a meal. 05/01/22 06/16/24  Ghimire, Werner Lean, MD  Cholecalciferol (VITAMIN D-3 PO) Take 1 capsule by mouth daily.    [provider]  cloNIDine (CATAPRES) 0.1 MG tablet Take 1 tablet (0.1 mg total) by mouth 3 (three) times daily. 06/19/23   Hughie Closs, MD  hydrALAZINE (APRESOLINE) 100 MG tablet Take 1 tablet (100 mg total) by mouth every 8 (eight) hours. 12/18/22   Corky Crafts, MD  isosorbide mononitrate (IMDUR) 30 MG 24 hr tablet TAKE 1 TABLET (30MG ) ON DIALYSIS DAYS AND 2 TABLETS (60 MG) ON NON DIALYSIS DAYS. 01/12/23   Corky Crafts, MD  naloxone [facility]) nasal spray 4 mg/0.1 mL 1 spray as directed. 02/19/22   [provider]  oxyCODONE-acetaminophen (PERCOCET) 10-325 MG tablet Take 1 tablet by mouth in the morning and at bedtime. 03/12/22   [provider]  pantoprazole (PROTONIX) 40 MG tablet TAKE 1 TABLET (40 MG TOTAL) BY MOUTH 2 (TWO) TIMES DAILY. 05/13/23   Georganna Skeans, MD  rosuvastatin (CRESTOR) 10 MG tablet TAKE 1 TABLET (10 MG TOTAL) BY MOUTH DAILY FOR CHOLESTEROL 08/07/22   Georganna Skeans, MD  senna-docusate (SENOKOT-S) 8.6-50 MG tablet Take 1 tablet by  mouth 2 (two) times daily between meals as needed for mild constipation. 08/14/22   Almon Hercules, MD  sevelamer carbonate (RENVELA) 2.4 g PACK Take 2.4 g by mouth 3 (three) times daily with meals. 03/06/22   Setzer, Lynnell Jude, PA-C  traZODone (DESYREL) 50 MG tablet Take 25-50 mg by mouth at bedtime as needed for sleep. 03/19/23   [provider]    Physical Exam: BP 109/87   Pulse 62   Temp 98.8 F (37.1 C)   Resp (!) 25   Wt 44.4 kg   SpO2 97%   BMI 17.34  kg/m   General: 73 y.o. year-old female well developed well nourished in no acute distress.  Alert and oriented x3. Cardiovascular: Irregular rate and rhythm with no rubs or gallops.  No thyromegaly or JVD noted.  No lower extremity edema. 2/4 pulses in all 4 extremities. Respiratory: Clear to auscultation with no wheezes or rales. Good inspiratory effort. Abdomen: Soft nontender nondistended with normal bowel sounds x4 quadrants. Muskuloskeletal: No cyanosis, clubbing or edema noted bilaterally Neuro: CN II-XII intact, strength, sensation, reflexes Skin: No ulcerative lesions noted or rashes Psychiatry: Judgement and insight appear normal. Mood is appropriate for condition and setting          Labs on Admission:  Basic Metabolic Panel: Recent Labs  Lab 06/19/23 0751 06/25/23 1430  NA 135 134*  K 5.1 6.4*  CL 94* 95*  CO2 26 19*  GLUCOSE 91 104*  BUN 55* 69*  CREATININE 9.29* 13.79*  CALCIUM 8.8* 9.0  PHOS 8.5*  --    Liver Function Tests: Recent Labs  Lab 06/19/23 0751 06/25/23 1430  AST  --  22  ALT  --  12  ALKPHOS  --  63  BILITOT  --  0.7  PROT  --  7.2  ALBUMIN 3.3* 3.6   No results for input(s): "LIPASE", "AMYLASE" in the last 168 hours. Recent Labs  Lab 06/25/23 1508  AMMONIA 16   CBC: Recent Labs  Lab 06/19/23 0751 06/25/23 1430  WBC 3.6* 5.2  NEUTROABS  --  4.2  HGB 8.9* 10.2*  HCT 28.0* 32.5*  MCV 91.2 92.9  PLT 165 298   Cardiac Enzymes: No results for input(s): "CKTOTAL",  "CKMB", "CKMBINDEX", "TROPONINI" in the last 168 hours.  BNP (last 3 results) Recent Labs    04/02/23 0208 05/06/23 0333 06/18/23 0334  BNP >4,500.0* >4,500.0* >4,500.0*    ProBNP (last 3 results) No results for input(s): "PROBNP" in the last 8760 hours.  CBG: Recent Labs  Lab 06/25/23 2318  GLUCAP 104*    Radiological Exams on Admission: MR BRAIN WO CONTRAST Result Date: 06/25/2023 CLINICAL DATA:  Altered mental status, fall EXAM: MRI HEAD WITHOUT CONTRAST TECHNIQUE: Multiplanar, multiecho pulse sequences of the brain and surrounding structures were obtained without intravenous contrast. COMPARISON:  02/25/2022 MRI head, correlation is also made with 06/25/2023 CT head FINDINGS: Brain: No restricted diffusion to suggest acute or subacute infarct. No acute hemorrhage, mass, mass effect, or midline shift. No hydrocephalus or extra-axial collection. Pituitary and craniocervical junction within normal limits. Remote infarct in the left frontal and parietal cortex. Diffusely dilated perivascular spaces in the basal ganglia, thalami, and hippocampi. Confluent T2 hyperintense signal in the periventricular white matter, likely the sequela of severe chronic small vessel ischemic disease. Vascular: Normal arterial flow voids. Skull and upper cervical spine: Normal marrow signal. Sinuses/Orbits: Clear paranasal sinuses. No acute finding in the orbits. Other: The mastoid air cells are well aerated. IMPRESSION: No acute intracranial process. No evidence of acute or subacute infarct. Electronically Signed   By: Wiliam Ke M.D.   On: 06/25/2023 19:14   DG Hand Complete Right Result Date: 06/25/2023 CLINICAL DATA:  Right hand pain after fall. EXAM: RIGHT HAND - COMPLETE 3+ VIEW COMPARISON:  Right hand x-rays dated June 10, 2006. FINDINGS: There is no evidence of fracture or dislocation. Scattered mild degenerative changes. Osteopenia. Soft tissues are unremarkable. IMPRESSION: 1. No acute osseous  abnormality. Electronically Signed   By: Obie Dredge M.D.   On: 06/25/2023 16:48   CT Head Wo  Contrast Result Date: 06/25/2023 CLINICAL DATA:  Mild EXAM: CT HEAD WITHOUT CONTRAST CT CERVICAL SPINE WITHOUT CONTRAST TECHNIQUE: Multidetector CT imaging of the head and cervical spine was performed following the standard protocol without intravenous contrast. Multiplanar CT image reconstructions of the cervical spine were also generated. RADIATION DOSE REDUCTION: This exam was performed according to the departmental dose-optimization program which includes automated exposure control, adjustment of the mA and/or kV according to patient size and/or use of iterative reconstruction technique. COMPARISON:  None Available. FINDINGS: CT HEAD FINDINGS Brain: Remote appearing high left frontal cortical infarct. Patchy white matter hypodensities are compatible with chronic microvascular ischemic disease. No evidence of acute hemorrhage, mass lesion, midline shift or hydrocephalus. Vascular: No hyperdense vessel.  Calcific atherosclerosis. Skull: No acute fracture. Sinuses/Orbits: Clear sinuses.  No acute orbital findings. Other: No mastoid effusions. CT CERVICAL SPINE FINDINGS Alignment: No substantial sagittal subluxation. Skull base and vertebrae: No acute fracture. No primary bone lesion or focal pathologic process. Soft tissues and spinal canal: No prevertebral fluid or swelling. No visible canal hematoma. Disc levels: Moderate multilevel degenerative change including degenerative disc height loss and bridging anterior osteophytes at multiple levels. Facet and uncovertebral hypertrophy with varying degrees of neural foraminal stenosis. Upper chest: Visualized lung apices are clear.  Emphysema. IMPRESSION: 1. No evidence of acute abnormality intracranially in the cervical spine. 2. Remote appearing high left frontal cortical infarct and chronic microvascular ischemic disease. An MRI could provide more sensitive  evaluation for acute infarct if clinically warranted. Electronically Signed   By: Feliberto Harts M.D.   On: 06/25/2023 14:53   CT Cervical Spine Wo Contrast Result Date: 06/25/2023 CLINICAL DATA:  Mild EXAM: CT HEAD WITHOUT CONTRAST CT CERVICAL SPINE WITHOUT CONTRAST TECHNIQUE: Multidetector CT imaging of the head and cervical spine was performed following the standard protocol without intravenous contrast. Multiplanar CT image reconstructions of the cervical spine were also generated. RADIATION DOSE REDUCTION: This exam was performed according to the departmental dose-optimization program which includes automated exposure control, adjustment of the mA and/or kV according to patient size and/or use of iterative reconstruction technique. COMPARISON:  None Available. FINDINGS: CT HEAD FINDINGS Brain: Remote appearing high left frontal cortical infarct. Patchy white matter hypodensities are compatible with chronic microvascular ischemic disease. No evidence of acute hemorrhage, mass lesion, midline shift or hydrocephalus. Vascular: No hyperdense vessel.  Calcific atherosclerosis. Skull: No acute fracture. Sinuses/Orbits: Clear sinuses.  No acute orbital findings. Other: No mastoid effusions. CT CERVICAL SPINE FINDINGS Alignment: No substantial sagittal subluxation. Skull base and vertebrae: No acute fracture. No primary bone lesion or focal pathologic process. Soft tissues and spinal canal: No prevertebral fluid or swelling. No visible canal hematoma. Disc levels: Moderate multilevel degenerative change including degenerative disc height loss and bridging anterior osteophytes at multiple levels. Facet and uncovertebral hypertrophy with varying degrees of neural foraminal stenosis. Upper chest: Visualized lung apices are clear.  Emphysema. IMPRESSION: 1. No evidence of acute abnormality intracranially in the cervical spine. 2. Remote appearing high left frontal cortical infarct and chronic microvascular ischemic  disease. An MRI could provide more sensitive evaluation for acute infarct if clinically warranted. Electronically Signed   By: Feliberto Harts M.D.   On: 06/25/2023 14:53    EKG: I independently viewed the EKG done and my findings are as followed: Sinus rhythm rate of 83.  Nonspecific ST-T changes.  QTc 506.  Assessment/Plan Present on Admission:  Atrial fibrillation with RVR (HCC)  Active Problems:   Atrial fibrillation with RVR (HCC)  Chronic atrial fibrillation with RVR Started on diltiazem drip in the ED, heart rate is now controlled.  Hold off diltiazem drip for now. Restarted home Coreg Closely monitor on telemetry Resume home Eliquis for CVA prevention  Hyperkalemia secondary to missing hemodialysis Treated with Lokelma and hemodialysis Repeat renal function panel  Mild non-anion gap metabolic acidosis in the setting of missing hemodialysis Anion gap 20 and serum bicarb 19 Electrolytes managed with hemodialysis Appreciate nephrology's assistance  History of CVA with right-sided deficit Resume home Eliquis Continue fall precautions  Hypertension Resume home oral antihypertensives as blood pressure allows Hold off Cardizem drip in the setting of HFrEF Continue to closely monitor vital signs  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 Volume status managed with hemodialysis. Continue strict I's and O's and daily weight  Generalized weakness and fall PT OT assessment Fall precautions   Time: 75 minutes.   DVT prophylaxis: Home Eliquis  Code Status: DNR  Family Communication: None at bedside.  Disposition Plan: Admitted to progressive care unit.  Consults called: Nephrology.  Admission status: Inpatient status.   Status is: Inpatient The patient requires at least 2 midnights for further evaluation and treatment of present  condition.    Darlin Drop MD [facility] Pager [phone]  If 7PM-7AM, please contact night-coverage [email or web address] Password [email or web address]  06/26/2023, 5:50 AM

## 2023-06-26 NOTE — ED Notes (Signed)
Upon standing

## 2023-06-26 NOTE — ED Notes (Signed)
Nurse notified of pt's increased heart rate.  KM

## 2023-06-26 NOTE — Progress Notes (Signed)
Transition of Care [facility]) - CAGE-AID Screening   Patient Details  Name: Monique Henderson MRN: [id number] Date of Birth: September 18, 1950  [facility]) CM/SW Contact:    Katha Hamming, RN Phone Number: 06/26/2023, 10:25 PM   Clinical Narrative:  Chart review with no alcohol, drug use, no resources indicated  CAGE-AID Screening:    Have You Ever Felt You Ought to Cut Down on Your Drinking or Drug Use?: No Have People Annoyed You By Critizing Your Drinking Or Drug Use?: No Have You Felt Bad Or Guilty About Your Drinking Or Drug Use?: No Have You Ever Had a Drink or Used Drugs First Thing In The Morning to Steady Your Nerves or to Get Rid of a Hangover?: No CAGE-AID Score: 0  Substance Abuse Education Offered: No

## 2023-06-26 NOTE — ED Notes (Signed)
Cardizem gtt nowed @ max dose --- HR still maintaining 140s. Dr.Cardama made aware.

## 2023-06-26 NOTE — Evaluation (Addendum)
Physical Therapy Evaluation Patient Details Name: Monique Henderson MRN: [id number] DOB: 03-Nov-1950 Today's Date: 06/26/2023  History of Present Illness  73 y.o female presents to [facility] 06/25/23 after being down on the ground for more than two days. MRI negative for CVA. Prior admit 06/03/23 with confusion and respiratory distress after missing HD. PMH includes ESRD (HD TTS), HTN, PAF, polysubstance abuse, aortic aneurysm dissection s/p repair, afib, stroke w/ R sided deficit.   Clinical Impression  Pt in bed upon arrival and agreeable to PT eval. Pt reports ambulating with no AD prior to admit. Pt reports two prior falls in the past 6 months due to tripping. Pt was unable to stand from the ground after this most recent fall. In today's session, pt required ModA for bed mobility and ModA to step pivot to [facility]. Pt required assist for pericare in standing. Deferred further gait due to variable HR with elevated potassium levels, RN notified. Pt presents to therapy session with decreased LE strength, balance, history of falls, and decreased mobility. Pt would benefit from acute skilled PT to address functional impairments. Recommending post-acute rehab <3hrs to work towards independence with mobility. Acute PT to follow.          If plan is discharge home, recommend the following: A little help with walking and/or transfers;A little help with bathing/dressing/bathroom;Assistance with cooking/housework;Assist for transportation;Help with stairs or ramp for entrance   Can travel by private vehicle    (TBD)    Equipment Recommendations Other (comment) (TBD at next venue)     Functional Status Assessment Patient has had a recent decline in their functional status and demonstrates the ability to make significant improvements in function in a reasonable and predictable amount of time.     Precautions / Restrictions Precautions Precautions: Fall Precaution Comments: watch HR Restrictions Weight Bearing  Restrictions Per Provider Order: No      Mobility  Bed Mobility Overal bed mobility: Needs Assistance Bed Mobility: Supine to Sit, Sit to Supine     Supine to sit: Mod assist Sit to supine: Mod assist   General bed mobility comments: pt able to minimally move LE's towards EOB. ModA to complete moving LE's off EOB with 1UE used to elevate trunk. ModA for return to supine for LE management.    Transfers Overall transfer level: Needs assistance Equipment used: Rolling walker (2 wheels), 1 person hand held assist Transfers: Sit to/from Stand, Bed to chair/wheelchair/BSC Sit to Stand: Mod assist   Step pivot transfers: Mod assist    General transfer comment: ModA for boost up and steadying. Pt able to stand and take a few steps with ModA to/from [facility]    Ambulation/Gait    General Gait Details: deferred due to variable HR and high K+ levels     Balance Overall balance assessment: Needs assistance, Mild deficits observed, not formally tested Sitting-balance support: Bilateral upper extremity supported, Feet supported Sitting balance-Leahy Scale: Good     Standing balance support: Bilateral upper extremity supported, During functional activity, Reliant on assistive device for balance Standing balance-Leahy Scale: Poor Standing balance comment: reliant on RW and external support       Pertinent Vitals/Pain Pain Assessment Pain Assessment: No/denies pain    Home Living Family/patient expects to be discharged to:: Private residence Living Arrangements: Spouse/significant other Available Help at Discharge: Family;Available PRN/intermittently Type of Home: Apartment Home Access: Stairs to enter Entrance Stairs-Rails: Left Entrance Stairs-Number of Steps: pt reports flight of stairs   Home Layout: One level Home  Equipment: Agricultural consultant (2 wheels);Cane - single point;BSC/3in1      Prior Function Prior Level of Function : Independent/Modified Independent;Needs  assist;History of Falls (last six months)    Mobility Comments: ind no AD, reports two prior falls due to tripping ADLs Comments: Independent in ADL and IADL. Family drives her to appointments/grocery store and provides pillbox set-up for medication management     Extremity/Trunk Assessment   Upper Extremity Assessment Upper Extremity Assessment: Defer to OT evaluation    Lower Extremity Assessment Lower Extremity Assessment: Generalized weakness    Cervical / Trunk Assessment Cervical / Trunk Assessment: Normal  Communication   Communication Communication: No apparent difficulties Cueing Techniques: Verbal cues  Cognition Arousal: Alert Behavior During Therapy: WFL for tasks assessed/performed Overall Cognitive Status: Within Functional Limits for tasks assessed       General Comments General comments (skin integrity, edema, etc.): Variable HR from 90 BPM at rest to 135 BPM while transferring     PT Assessment Patient needs continued PT services  PT Problem List Decreased strength;Decreased activity tolerance;Decreased balance;Decreased mobility       PT Treatment Interventions DME instruction;Gait training;Stair training;Functional mobility training;Therapeutic activities;Therapeutic exercise;Balance training;Neuromuscular re-education;Patient/family education    PT Goals (Current goals can be found in the Care Plan section)  Acute Rehab PT Goals Patient Stated Goal: to get better PT Goal Formulation: With patient Time For Goal Achievement: 07/10/23 Potential to Achieve Goals: Good    Frequency Min 1X/week        AM-PAC PT "6 Clicks" Mobility  Outcome Measure Help needed turning from your back to your side while in a flat bed without using bedrails?: A Little Help needed moving from lying on your back to sitting on the side of a flat bed without using bedrails?: A Lot Help needed moving to and from a bed to a chair (including a wheelchair)?: A Lot Help needed  standing up from a chair using your arms (e.g., wheelchair or bedside chair)?: A Lot Help needed to walk in hospital room?: A Lot Help needed climbing 3-5 steps with a railing? : Total 6 Click Score: 12    End of Session Equipment Utilized During Treatment: Gait belt Activity Tolerance: Treatment limited secondary to medical complications (Comment) (variable HR, high K+ levels) Patient left: in bed;with call bell/phone within reach Nurse Communication: Mobility status;Other (comment) (HR) PT Visit Diagnosis: Unsteadiness on feet (R26.81);Muscle weakness (generalized) (M62.81);History of falling (Z91.81)    Time: 6962-9528 PT Time Calculation (min) (ACUTE ONLY): 24 min   Charges:   PT Evaluation $PT Eval Low Complexity: 1 Low PT Treatments $Therapeutic Activity: 8-22 mins PT General Charges $$ ACUTE PT VISIT: 1 Visit        Hilton Cork, PT, DPT Secure Chat Preferred  Rehab Office [phone]   Arturo Morton Brion Aliment 06/26/2023, 10:12 AM

## 2023-06-26 NOTE — Progress Notes (Signed)
Monique Henderson is an 73 y.o. female  with ESRD on HD TTS p/w dyspnea, hypoxia, severe hypertension. Recent [facility] admission on 1/2 -06/06/23 with pneumonia- completed course of antibiotics and then 1/16-1/18 with similar presentation  with increasing O2 requirement with CXR shows pulmonary edema/pneumonia.  Now with AMS after tripping on her high heels. H/O CVA with right-sided weakness but states that she is able to walk afterwards. Last outpt dialysis was Tues 1/21 and left at 43.6kg.  She missed treatment on Thursday.   Dialysis Orders:  AF TTS 3.5H 400/A1.5x 2K/2Ca TDC No heparin Mircera 225 q 2 wks (last given on 1/17, due 1/24), new EDW 41kg Hectrol 9 TIW Sensipar 60 three times per week  Assessment/Plan: Hyperkalemia 6.4 -secondary to missed dialysis treatment.  Temporizing with medical regimen in the emergency department and 2.5hr tx 1/24. Rapid response responding to IV fluids with total net UF 1.1 L.  Much more stable this morning and asking to go home.    ESRD -  TTS. Missed HD on Thursday. Left 2.6 kg over EDW.  She is listed at 47 kg but her true dry weight after leaving the hospital a week ago was 41 kg.  See above.  Since she had dialysis very late last night we will hold off next treatment till Monday if she is still here.  Otherwise TTS regimen outpatient is fine.  I do not believe her true dry weight is 41 kg, likely a mistake.  Will list her dry weight as 44 kg for now and can always gently challenge as outpatient slowly as tolerated.  Hypertension/volume  - Severe HTN on admit.  Start home regimen and ultrafiltration will help as well.   Follow post HD   Anemia  - Hgb 10.2. No ESA needs currently   Metabolic bone disease -  Stable. Continue home meds as able.   Afib - on BB/Eliquis   Subjective: Rapid response called last night on dialysis for hypotension and unresponsiveness with systolics dropping into the 50s.  Patient responded to fluid bolus and is very stable this  morning.  Patient is asking to go home.  Denies shortness of breath, chest pain, nausea, vomiting.   Chemistry and CBC: Creatinine  Date/Time Value Ref Range Status  09/28/2012 08:31 AM 1.2 (H) 0.6 - 1.1 mg/dL Final   Creat  Date/Time Value Ref Range Status  01/18/2017 11:45 AM 2.09 (H) 0.50 - 0.99 mg/dL Final    Comment:      For patients > or = 71 years of age: The upper reference limit for Creatinine is approximately 13% higher for people identified as African-American.     12/09/2016 02:58 PM 2.23 (H) 0.50 - 0.99 mg/dL Final    Comment:      For patients > or = 71 years of age: The upper reference limit for Creatinine is approximately 13% higher for people identified as African-American.     02/14/2014 09:51 AM 1.24 (H) 0.50 - 1.10 mg/dL Final  16/03/9603 54:09 AM 1.52 (H) 0.50 - 1.10 mg/dL Final  81/19/1478 29:56 AM 1.33 (H) 0.50 - 1.10 mg/dL Final  21/30/8657 84:69 AM 1.38 (H) 0.50 - 1.10 mg/dL Final  62/95/2841 32:44 PM 2.44 (H) 0.50 - 1.10 mg/dL Final  06/03/7251 66:44 PM 1.66 (H) 0.50 - 1.10 mg/dL Final  03/47/4259 56:38 AM 1.43 (H) 0.50 - 1.10 mg/dL Final   Creatinine, Ser  Date/Time Value Ref Range Status  06/25/2023 02:30 PM 13.79 (H) 0.44 - 1.00 mg/dL  Final  06/19/2023 07:51 AM 9.29 (H) 0.44 - 1.00 mg/dL Final    Comment:    DELTA CHECK NOTED  06/18/2023 03:34 AM 5.77 (H) 0.44 - 1.00 mg/dL Final  16/03/9603 54:09 AM 8.86 (H) 0.44 - 1.00 mg/dL Final  81/19/1478 29:56 AM 10.94 (H) 0.44 - 1.00 mg/dL Final  21/30/8657 84:69 AM 8.83 (H) 0.44 - 1.00 mg/dL Final  62/95/2841 32:44 AM 6.34 (H) 0.44 - 1.00 mg/dL Final  06/03/7251 66:44 AM 8.94 (H) 0.44 - 1.00 mg/dL Final  03/47/4259 56:38 AM 16.81 (H) 0.44 - 1.00 mg/dL Final  75/64/3329 51:88 PM 8.86 (H) 0.44 - 1.00 mg/dL Final  41/66/0630 16:01 AM 14.71 (H) 0.44 - 1.00 mg/dL Final  09/32/3557 32:20 AM 14.57 (H) 0.44 - 1.00 mg/dL Final  25/42/7062 37:62 PM 3.77 (H) 0.44 - 1.00 mg/dL Final  83/15/1761 60:73 AM 6.29  (H) 0.44 - 1.00 mg/dL Final  71/10/2692 85:46 AM 10.52 (H) 0.44 - 1.00 mg/dL Final  27/07/5007 38:18 AM 9.87 (H) 0.44 - 1.00 mg/dL Final  29/93/7169 67:89 AM 10.54 (H) 0.44 - 1.00 mg/dL Final  38/03/1750 02:58 AM 8.44 (H) 0.44 - 1.00 mg/dL Final  52/77/8242 35:36 PM 14.20 (H) 0.44 - 1.00 mg/dL Final  14/43/1540 08:67 PM 12.90 (H) 0.44 - 1.00 mg/dL Final  61/95/0932 67:12 AM 2.95 (H) 0.44 - 1.00 mg/dL Final  45/80/9983 38:25 AM 4.33 (H) 0.44 - 1.00 mg/dL Final    Comment:    DELTA CHECK NOTED  01/15/2023 02:59 AM 1.10 (H) 0.44 - 1.00 mg/dL Final    Comment:    DIALYSIS  01/14/2023 09:34 PM 9.22 (H) 0.44 - 1.00 mg/dL Final  05/39/7673 41:93 PM 8.91 (H) 0.44 - 1.00 mg/dL Final  79/07/4095 35:32 AM 8.62 (H) 0.44 - 1.00 mg/dL Final  99/24/2683 41:96 AM 10.10 (H) 0.44 - 1.00 mg/dL Final  22/29/7989 21:19 AM 18.86 (H) 0.44 - 1.00 mg/dL Final  41/74/0814 48:18 AM 19.00 (H) 0.44 - 1.00 mg/dL Final  56/31/4970 26:37 AM 8.24 (H) 0.44 - 1.00 mg/dL Final  85/88/5027 74:12 AM 5.49 (H) 0.44 - 1.00 mg/dL Final  87/86/7672 09:47 AM 9.97 (H) 0.44 - 1.00 mg/dL Final  09/62/8366 29:47 PM 14.52 (H) 0.44 - 1.00 mg/dL Final  65/46/5035 46:56 AM 5.26 (H) 0.44 - 1.00 mg/dL Final  81/27/5170 01:74 AM 6.53 (H) 0.44 - 1.00 mg/dL Final    Comment:    DIALYSIS  04/29/2022 08:39 PM 15.48 (H) 0.44 - 1.00 mg/dL Final  94/49/6759 16:38 PM 9.82 (H) 0.44 - 1.00 mg/dL Final  46/65/9935 70:17 AM 10.18 (H) 0.44 - 1.00 mg/dL Final  79/39/0300 92:33 AM 7.38 (H) 0.44 - 1.00 mg/dL Final  00/76/2263 33:54 AM 11.35 (H) 0.44 - 1.00 mg/dL Final  56/25/6389 37:34 AM 7.22 (H) 0.44 - 1.00 mg/dL Final  28/76/8115 72:62 AM 7.92 (H) 0.44 - 1.00 mg/dL Final    Comment:    DELTA CHECK NOTED  02/26/2022 02:56 PM 4.56 (H) 0.44 - 1.00 mg/dL Final    Comment:    DIALYSIS DELTA CHECK NOTED   02/24/2022 04:11 AM 10.03 (H) 0.44 - 1.00 mg/dL Final  03/55/9741 63:84 PM 8.96 (H) 0.44 - 1.00 mg/dL Final  53/64/6803 21:22 AM 3.74 (H) 0.44  - 1.00 mg/dL Final    Comment:    DIALYSIS DELTA CHECK NOTED   01/29/2022 11:31 AM 8.80 (H) 0.44 - 1.00 mg/dL Final  48/25/0037 04:88 AM 7.97 (H) 0.44 - 1.00 mg/dL Final  89/16/9450 38:88 AM 9.06 (H) 0.44 - 1.00 mg/dL  Final  01/07/2022 03:49 AM 6.44 (H) 0.44 - 1.00 mg/dL Final  16/03/9603 54:09 PM 5.13 (H) 0.44 - 1.00 mg/dL Final    Comment:    DELTA CHECK NOTED  01/06/2022 10:46 AM 12.75 (H) 0.44 - 1.00 mg/dL Final   Recent Labs  Lab 06/25/23 1430  NA 134*  K 6.4*  CL 95*  CO2 19*  GLUCOSE 104*  BUN 69*  CREATININE 13.79*  CALCIUM 9.0   Recent Labs  Lab 06/25/23 1430  WBC 5.2  NEUTROABS 4.2  HGB 10.2*  HCT 32.5*  MCV 92.9  PLT 298   Liver Function Tests: Recent Labs  Lab 06/25/23 1430  AST 22  ALT 12  ALKPHOS 63  BILITOT 0.7  PROT 7.2  ALBUMIN 3.6   No results for input(s): "LIPASE", "AMYLASE" in the last 168 hours. Recent Labs  Lab 06/25/23 1508  AMMONIA 16   Cardiac Enzymes: No results for input(s): "CKTOTAL", "CKMB", "CKMBINDEX", "TROPONINI" in the last 168 hours. Iron Studies: No results for input(s): "IRON", "TIBC", "TRANSFERRIN", "FERRITIN" in the last 72 hours. PT/INR: @LABRCNTIP (inr:5)  Xrays/Other Studies: ) Results for orders placed or performed during the hospital encounter of 06/25/23 (from the past 48 hours)  CBC with Differential     Status: Abnormal   Collection Time: 06/25/23  2:30 PM  Result Value Ref Range   WBC 5.2 4.0 - 10.5 K/uL   RBC 3.50 (L) 3.87 - 5.11 MIL/uL   Hemoglobin 10.2 (L) 12.0 - 15.0 g/dL   HCT 81.1 (L) 91.4 - 78.2 %   MCV 92.9 80.0 - 100.0 fL   MCH 29.1 26.0 - 34.0 pg   MCHC 31.4 30.0 - 36.0 g/dL   RDW 95.6 (H) 21.3 - 08.6 %   Platelets 298 150 - 400 K/uL   nRBC 0.0 0.0 - 0.2 %   Neutrophils Relative % 80 %   Neutro Abs 4.2 1.7 - 7.7 K/uL   Lymphocytes Relative 9 %   Lymphs Abs 0.5 (L) 0.7 - 4.0 K/uL   Monocytes Relative 9 %   Monocytes Absolute 0.5 0.1 - 1.0 K/uL   Eosinophils Relative 1 %    Eosinophils Absolute 0.0 0.0 - 0.5 K/uL   Basophils Relative 1 %   Basophils Absolute 0.0 0.0 - 0.1 K/uL   Immature Granulocytes 0 %   Abs Immature Granulocytes 0.02 0.00 - 0.07 K/uL    Comment: Performed at [facility], [address] [place], [place] [address]  Comprehensive metabolic panel     Status: Abnormal   Collection Time: 06/25/23  2:30 PM  Result Value Ref Range   Sodium 134 (L) 135 - 145 mmol/L   Potassium 6.4 (HH) 3.5 - 5.1 mmol/L    Comment: CRITICAL RESULT CALLED TO, READ BACK BY AND VERIFIED WITH K,RODGERS RN @1621  06/25/23 E,[place]   Chloride 95 (L) 98 - 111 mmol/L   CO2 19 (L) 22 - 32 mmol/L   Glucose, Bld 104 (H) 70 - 99 mg/dL    Comment: Glucose reference range applies only to samples taken after fasting for at least 8 hours.   BUN 69 (H) 8 - 23 mg/dL   Creatinine, Ser 96.29 (H) 0.44 - 1.00 mg/dL   Calcium 9.0 8.9 - 52.8 mg/dL   Total Protein 7.2 6.5 - 8.1 g/dL   Albumin 3.6 3.5 - 5.0 g/dL   AST 22 15 - 41 U/L   ALT 12 0 - 44 U/L   Alkaline Phosphatase 63 38 -  126 U/L   Total Bilirubin 0.7 0.0 - 1.2 mg/dL   GFR, Estimated 3 (L) >60 mL/min    Comment: (NOTE) Calculated using the CKD-EPI Creatinine Equation (2021)    Anion gap 20 (H) 5 - 15    Comment: Performed at [facility], [address] [place], [place] [address]  Protime-INR     Status: None   Collection Time: 06/25/23  2:30 PM  Result Value Ref Range   Prothrombin Time 14.5 11.4 - 15.2 seconds   INR 1.1 0.8 - 1.2    Comment: (NOTE) INR goal varies based on device and disease states. Performed at [facility], [address] [place], [place] [address]   I-Stat Lactic Acid     Status: None   Collection Time: 06/25/23  2:38 PM  Result Value Ref Range   Lactic Acid, Venous 0.6 0.5 - 1.9 mmol/L  Ammonia     Status: None   Collection Time: 06/25/23  3:08 PM  Result Value Ref Range   Ammonia 16 9 - 35 umol/L    Comment: Performed at [facility], [address]  [place], [place] [address]  I-Stat Lactic Acid     Status: None   Collection Time: 06/25/23  5:42 PM  Result Value Ref Range   Lactic Acid, Venous 1.9 0.5 - 1.9 mmol/L  Glucose, capillary     Status: Abnormal   Collection Time: 06/25/23 11:18 PM  Result Value Ref Range   Glucose-Capillary 104 (H) 70 - 99 mg/dL    Comment: Glucose reference range applies only to samples taken after fasting for at least 8 hours.   *Note: Due to a large number of results and/or encounters for the requested time period, some results have not been displayed. A complete set of results can be found in Results Review.   MR BRAIN WO CONTRAST Result Date: 06/25/2023 CLINICAL DATA:  Altered mental status, fall EXAM: MRI HEAD WITHOUT CONTRAST TECHNIQUE: Multiplanar, multiecho pulse sequences of the brain and surrounding structures were obtained without intravenous contrast. COMPARISON:  02/25/2022 MRI head, correlation is also made with 06/25/2023 CT head FINDINGS: Brain: No restricted diffusion to suggest acute or subacute infarct. No acute hemorrhage, mass, mass effect, or midline shift. No hydrocephalus or extra-axial collection. Pituitary and craniocervical junction within normal limits. Remote infarct in the left frontal and parietal cortex. Diffusely dilated perivascular spaces in the basal ganglia, thalami, and hippocampi. Confluent T2 hyperintense signal in the periventricular white matter, likely the sequela of severe chronic small vessel ischemic disease. Vascular: Normal arterial flow voids. Skull and upper cervical spine: Normal marrow signal. Sinuses/Orbits: Clear paranasal sinuses. No acute finding in the orbits. Other: The mastoid air cells are well aerated. IMPRESSION: No acute intracranial process. No evidence of acute or subacute infarct. Electronically Signed   By: Wiliam Ke M.D.   On: 06/25/2023 19:14   DG Hand Complete Right Result Date: 06/25/2023 CLINICAL DATA:  Right hand pain after fall. EXAM: RIGHT HAND  - COMPLETE 3+ VIEW COMPARISON:  Right hand x-rays dated June 10, 2006. FINDINGS: There is no evidence of fracture or dislocation. Scattered mild degenerative changes. Osteopenia. Soft tissues are unremarkable. IMPRESSION: 1. No acute osseous abnormality. Electronically Signed   By: Obie Dredge M.D.   On: 06/25/2023 16:48   CT Head Wo Contrast Result Date: 06/25/2023 CLINICAL DATA:  Mild EXAM: CT HEAD WITHOUT CONTRAST CT CERVICAL SPINE WITHOUT CONTRAST TECHNIQUE: Multidetector CT imaging of the head and  cervical spine was performed following the standard protocol without intravenous contrast. Multiplanar CT image reconstructions of the cervical spine were also generated. RADIATION DOSE REDUCTION: This exam was performed according to the departmental dose-optimization program which includes automated exposure control, adjustment of the mA and/or kV according to patient size and/or use of iterative reconstruction technique. COMPARISON:  None Available. FINDINGS: CT HEAD FINDINGS Brain: Remote appearing high left frontal cortical infarct. Patchy white matter hypodensities are compatible with chronic microvascular ischemic disease. No evidence of acute hemorrhage, mass lesion, midline shift or hydrocephalus. Vascular: No hyperdense vessel.  Calcific atherosclerosis. Skull: No acute fracture. Sinuses/Orbits: Clear sinuses.  No acute orbital findings. Other: No mastoid effusions. CT CERVICAL SPINE FINDINGS Alignment: No substantial sagittal subluxation. Skull base and vertebrae: No acute fracture. No primary bone lesion or focal pathologic process. Soft tissues and spinal canal: No prevertebral fluid or swelling. No visible canal hematoma. Disc levels: Moderate multilevel degenerative change including degenerative disc height loss and bridging anterior osteophytes at multiple levels. Facet and uncovertebral hypertrophy with varying degrees of neural foraminal stenosis. Upper chest: Visualized lung apices are  clear.  Emphysema. IMPRESSION: 1. No evidence of acute abnormality intracranially in the cervical spine. 2. Remote appearing high left frontal cortical infarct and chronic microvascular ischemic disease. An MRI could provide more sensitive evaluation for acute infarct if clinically warranted. Electronically Signed   By: Feliberto Harts M.D.   On: 06/25/2023 14:53   CT Cervical Spine Wo Contrast Result Date: 06/25/2023 CLINICAL DATA:  Mild EXAM: CT HEAD WITHOUT CONTRAST CT CERVICAL SPINE WITHOUT CONTRAST TECHNIQUE: Multidetector CT imaging of the head and cervical spine was performed following the standard protocol without intravenous contrast. Multiplanar CT image reconstructions of the cervical spine were also generated. RADIATION DOSE REDUCTION: This exam was performed according to the departmental dose-optimization program which includes automated exposure control, adjustment of the mA and/or kV according to patient size and/or use of iterative reconstruction technique. COMPARISON:  None Available. FINDINGS: CT HEAD FINDINGS Brain: Remote appearing high left frontal cortical infarct. Patchy white matter hypodensities are compatible with chronic microvascular ischemic disease. No evidence of acute hemorrhage, mass lesion, midline shift or hydrocephalus. Vascular: No hyperdense vessel.  Calcific atherosclerosis. Skull: No acute fracture. Sinuses/Orbits: Clear sinuses.  No acute orbital findings. Other: No mastoid effusions. CT CERVICAL SPINE FINDINGS Alignment: No substantial sagittal subluxation. Skull base and vertebrae: No acute fracture. No primary bone lesion or focal pathologic process. Soft tissues and spinal canal: No prevertebral fluid or swelling. No visible canal hematoma. Disc levels: Moderate multilevel degenerative change including degenerative disc height loss and bridging anterior osteophytes at multiple levels. Facet and uncovertebral hypertrophy with varying degrees of neural foraminal  stenosis. Upper chest: Visualized lung apices are clear.  Emphysema. IMPRESSION: 1. No evidence of acute abnormality intracranially in the cervical spine. 2. Remote appearing high left frontal cortical infarct and chronic microvascular ischemic disease. An MRI could provide more sensitive evaluation for acute infarct if clinically warranted. Electronically Signed   By: Feliberto Harts M.D.   On: 06/25/2023 14:53    PMH:   Past Medical History:  Diagnosis Date   Acute cardioembolic stroke (HCC) 02/26/2022   AF (paroxysmal atrial fibrillation) (HCC) 05/29/2019   on Coumadin   Aortic atherosclerosis (HCC) 07/05/2019   Aortic dissection (HCC) 04/04/2019   s/p repair   Bone spur 2008   Right calcaneal foot spur   Breast cancer (HCC) 2004   Ductal carcinoma in situ of the left breast; S/P left  partial mastectomy 02/26/2003; S/P re-excision of cranial and lateral margins11/18/2004.radiation   Carotid artery disease (HCC)    Cerebral thrombosis with cerebral infarction 05/22/2019   Chronic HFrEF (heart failure with reduced ejection fraction) (HCC)    Chronic low back pain 06/22/2016   Chronic obstructive lung disease (HCC) 01/16/2017   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 Chevis Pretty; S/P re-excision of superior and inferior margins 10/27/2012.    Dissection of aorta (HCC) 04/03/2019   ESRD on hemodialysis (HCC) 05/29/2019   Essential hypertension 09/16/2006   GERD 09/16/2006   Hepatitis C    treated and RNA confirmed not detectable 01/2017   Insomnia 03/14/2015   Malnutrition of moderate degree 05/19/2019   Non compliance with medical treatment 12/04/2017   Normocytic anemia    With thrombocytosis   Osteoarthritis    Right ureteral stone 2002   S/P lumbar spinal fusion 01/18/2014   S/P lumbar decompressive laminectomy, fusion, and plating for lumbar spinal stenosis on 05/27/2009 by Dr. Tia Alert.  S/P anterolateral  retroperitoneal interbody fusion L2-3 utilizing a 8 mm peek interbody cage packed with morcellized allograft, and anterior lumbar plating L2-3 for recurrent disc herniation L2-3 with spinal stenosis on 01/18/2014 by Dr. Tia Alert.     Stroke (cerebrum) (HCC)    Stroke (HCC) 02/23/2022   SVT (supraventricular tachycardia) (HCC) 08/28/2019   Tobacco use disorder 04/19/2009   Uterine fibroid    Wears dentures    top    PSH:   Past Surgical History:  Procedure Laterality Date   ANTERIOR LAT LUMBAR FUSION N/A 01/18/2014   Procedure: ANTERIOR LATERAL LUMBAR FUSION LUMBAR TWO-THREE;  Surgeon: Tia Alert, MD;  Location: [facility];  Service: Neurosurgery;  Laterality: N/A;   ANTERIOR LUMBAR FUSION  01/18/2014   AV FISTULA PLACEMENT Left 04/20/2019   Procedure: ARTERIOVENOUS (AV) FISTULA CREATION;  Surgeon: Maeola Harman, MD;  Location: [facility] OR;  Service: Vascular;  Laterality: Left;   BACK SURGERY     BREAST LUMPECTOMY Left 01/2003   BREAST LUMPECTOMY Right 2014   BREAST LUMPECTOMY WITH NEEDLE LOCALIZATION AND AXILLARY SENTINEL LYMPH NODE BX Right 10/13/2012   Procedure: BREAST LUMPECTOMY WITH NEEDLE LOCALIZATION;  Surgeon: Robyne Askew, MD;  Location: [facility];  Service: General;  Laterality: Right;  Right breast wire localized lumpectomy   DIALYSIS/PERMA CATHETER INSERTION N/A 05/28/2023   Procedure: DIALYSIS/PERMA CATHETER INSERTION;  Surgeon: Ethelene Hal, MD;  Location: [facility];  Service: Cardiovascular;  Laterality: N/A;   INSERTION OF DIALYSIS CATHETER Right 04/20/2019   Procedure: INSERTION OF DIALYSIS CATHETER, right internal jugular;  Surgeon: Maeola Harman, MD;  Location: [facility] OR;  Service: Vascular;  Laterality: Right;   INSERTION OF DIALYSIS CATHETER Right 09/24/2020   Procedure: INSERTION OF TUNNELED DIALYSIS CATHETER;  Surgeon: Larina Earthly, MD;  Location: [facility] OR;  Service: Vascular;  Laterality: Right;   IR FLUORO GUIDE CV  LINE RIGHT  09/22/2020   IR THORACENTESIS ASP PLEURAL SPACE W/IMG GUIDE  05/19/2019   IR US GUIDE VASC ACCESS LEFT  09/22/2020   IR US GUIDE VASC ACCESS RIGHT  09/22/2020   IR VENOCAVAGRAM SVC  09/22/2020   LAMINECTOMY  05/27/2009   Lumbar decompressive laminectomy, fusion and plating for lumbar spinal stensosis   LIGATION OF ARTERIOVENOUS  FISTULA Left 09/24/2020   Procedure: LIGATION OF LEFT ARM ARTERIOVENOUS  FISTULA;  Surgeon: Larina Earthly, MD;  Location: [facility];  Service: Vascular;  Laterality: Left;   LUMBAR LAMINECTOMY/DECOMPRESSION MICRODISCECTOMY Left 03/23/2013   Procedure: LUMBAR LAMINECTOMY/DECOMPRESSION MICRODISCECTOMY 1 LEVEL;  Surgeon: Tia Alert, MD;  Location: [facility];  Service: Neurosurgery;  Laterality: Left;  LUMBAR LAMINECTOMY/DECOMPRESSION MICRODISCECTOMY 1 LEVEL   MASTECTOMY, PARTIAL Left 02/26/2003   ; S/P re-excision of cranial and lateral margins 04/19/2003.    RE-EXCISION OF BREAST CANCER,SUPERIOR MARGINS Right 10/27/2012   Procedure: RE-EXCISION OF BREAST CANCER,SUPERIOR and inferior MARGINS;  Surgeon: Robyne Askew, MD;  Location[facility];  Service: General;  Laterality: Right;   RE-EXCISION OF BREAST LUMPECTOMY Left 04/2003   TEE WITHOUT CARDIOVERSION N/A 04/04/2019   Procedure: Transesophageal Echocardiogram (Tee);  Surgeon: Linden Dolin, MD;  Location: [facility] OR;  Service: Open Heart Surgery;  Laterality: N/A;   THORACIC AORTIC ANEURYSM REPAIR N/A 04/04/2019   Procedure: THORACIC ASCENDING ANEURYSM REPAIR (AAA)  USING 28 MM X 30 CM HEMASHIELD PLATINUM VASCULAR GRAFT;  Surgeon: Linden Dolin, MD;  Location: [facility] OR;  Service: Open Heart Surgery;  Laterality: N/A;    Allergies:  Allergies  Allergen Reactions   Shrimp [Shellfish Allergy] Shortness Of Breath   Bactroban [Mupirocin] Other (See Comments)    "Sores in nose"   Hydrocodone Itching, Nausea Only and Nausea And Vomiting   Latex Itching    Latex gloves cause itchiness and a rash   Chlorhexidine  Gluconate Itching   Tylenol [Acetaminophen] Itching    Takes Percocet at home 05/06/23   Zestril [Lisinopril] Cough    Medications:   Prior to Admission medications   Medication Sig Start Date End Date Taking? Authorizing Provider  albuterol (PROVENTIL) (2.5 MG/3ML) 0.083% nebulizer solution Take 2.5 mg by nebulization every 6 (six) hours as needed for wheezing or shortness of breath.   Yes [provider]  albuterol (VENTOLIN HFA) 108 (90 Base) MCG/ACT inhaler Inhale 2 puffs into the lungs every 6 (six) hours as needed for wheezing or shortness of breath. 07/30/20  Yes Janeece Agee, NP  amLODipine (NORVASC) 10 MG tablet Take 1 tablet (10 mg total) by mouth daily. 03/06/22  Yes Setzer, Lynnell Jude, PA-C  apixaban (ELIQUIS) 2.5 MG TABS tablet Take 1 tablet (2.5 mg total) by mouth 2 (two) times daily. 03/06/22  Yes Setzer, Lynnell Jude, PA-C  carvedilol (COREG) 25 MG tablet Take 1 tablet (25 mg total) by mouth 2 (two) times daily with a meal. 05/01/22 06/16/24 Yes Ghimire, Werner Lean, MD  Cholecalciferol (VITAMIN D-3 PO) Take 1 capsule by mouth daily.   Yes [provider]  cloNIDine (CATAPRES) 0.1 MG tablet Take 1 tablet (0.1 mg total) by mouth 3 (three) times daily. 06/19/23  Yes Hughie Closs, MD  hydrALAZINE (APRESOLINE) 100 MG tablet Take 1 tablet (100 mg total) by mouth every 8 (eight) hours. 12/18/22  Yes Corky Crafts, MD  isosorbide mononitrate (IMDUR) 30 MG 24 hr tablet TAKE 1 TABLET (30MG ) ON DIALYSIS DAYS AND 2 TABLETS (60 MG) ON NON DIALYSIS DAYS. 01/12/23  Yes Corky Crafts, MD  naloxone [facility]) nasal spray 4 mg/0.1 mL 1 spray as directed. 02/19/22  Yes [provider]  oxyCODONE-acetaminophen (PERCOCET) 10-325 MG tablet Take 1 tablet by mouth in the morning and at bedtime. 03/12/22  Yes [provider]  pantoprazole (PROTONIX) 40 MG tablet TAKE 1 TABLET (40 MG TOTAL) BY MOUTH 2 (TWO) TIMES DAILY. 05/13/23  Yes Georganna Skeans, MD  rosuvastatin  (CRESTOR) 10 MG tablet TAKE 1 TABLET (10 MG TOTAL)  BY MOUTH DAILY FOR CHOLESTEROL 08/07/22  Yes Georganna Skeans, MD  senna-docusate (SENOKOT-S) 8.6-50 MG tablet Take 1 tablet by mouth 2 (two) times daily between meals as needed for mild constipation. 08/14/22  Yes Almon Hercules, MD  sevelamer carbonate (RENVELA) 2.4 g PACK Take 2.4 g by mouth 3 (three) times daily with meals. 03/06/22  Yes Setzer, Lynnell Jude, PA-C  traZODone (DESYREL) 50 MG tablet Take 25-50 mg by mouth at bedtime as needed for sleep. 03/19/23  Yes [provider]    Discontinued Meds:   Medications Discontinued During This Encounter  Medication Reason   sodium chloride 0.9 % bolus 500 mL    diltiazem (CARDIZEM) 125 mg in dextrose 5% 125 mL (1 mg/mL) infusion     Social History:  reports that she quit smoking about 3 years ago. Her smoking use included cigarettes. She started smoking about 47 years ago. She has a 11 pack-year smoking history. She has never used smokeless tobacco. She reports that she does not currently use alcohol after a past usage of about 2.0 standard drinks of alcohol per week. She reports that she does not currently use drugs after having used the following drugs: Cocaine.  Family History:   Family History  Problem Relation Age of Onset   Colon cancer Mother 46   Hypertension Mother    Diabetes Sister 33   Hypertension Sister    Diabetes Brother    Hypertension Brother    Diabetes Brother    Hypertension Brother    Kidney disease Son        s/p renal transplant   Hypertension Son    Diabetes Son    Multiple sclerosis Son    Bone cancer Sister 53   Breast cancer Neg Hx    Cervical cancer Neg Hx     Blood pressure (!) 147/63, pulse 88, temperature 98.6 F (37 C), resp. rate 20, weight 44.4 kg, SpO2 96%. Physical Exam: General: Comfortable appearing Head: NCAT sclera not icteric MMM Neck: Supple.  Lungs: Diminished at bases  Heart: RRR Abdomen: SNDNT Lower extremities:without edema   Neuro: A & O X 3. Moves all extremities spontaneously certainly weakness on the right side. Psych:  Responds to questions appropriately  Dialysis Access: R internal jugular TDC      Latina Frank, Len Blalock, MD 06/26/2023, 11:13 AM

## 2023-06-27 DIAGNOSIS — I4891 Unspecified atrial fibrillation: Secondary | ICD-10-CM

## 2023-06-27 LAB — BASIC METABOLIC PANEL
Anion gap: 16 — ABNORMAL HIGH (ref 5–15)
BUN: 64 mg/dL — ABNORMAL HIGH (ref 8–23)
CO2: 23 mmol/L (ref 22–32)
Calcium: 7.8 mg/dL — ABNORMAL LOW (ref 8.9–10.3)
Chloride: 98 mmol/L (ref 98–111)
Creatinine, Ser: 10.59 mg/dL — ABNORMAL HIGH (ref 0.44–1.00)
GFR, Estimated: 4 mL/min — ABNORMAL LOW (ref >60)
Glucose, Bld: 121 mg/dL — ABNORMAL HIGH (ref 70–99)
Potassium: 4.5 mmol/L (ref 3.5–5.1)
Sodium: 137 mmol/L (ref 135–145)

## 2023-06-27 LAB — CBC
HCT: 28.4 % — ABNORMAL LOW (ref 36.0–46.0)
Hemoglobin: 9.2 g/dL — ABNORMAL LOW (ref 12.0–15.0)
MCH: 29.5 pg (ref 26.0–34.0)
MCHC: 32.4 g/dL (ref 30.0–36.0)
MCV: 91 fL (ref 80.0–100.0)
Platelets: 283 10*3/uL (ref 150–400)
RBC: 3.12 MIL/uL — ABNORMAL LOW (ref 3.87–5.11)
RDW: 18.1 % — ABNORMAL HIGH (ref 11.5–15.5)
WBC: 3.4 10*3/uL — ABNORMAL LOW (ref 4.0–10.5)
nRBC: 0 % (ref 0.0–0.2)

## 2023-06-27 LAB — MAGNESIUM: Magnesium: 2.2 mg/dL (ref 1.7–2.4)

## 2023-06-27 LAB — HEPATITIS B SURFACE ANTIBODY, QUANTITATIVE: Hep B S AB Quant (Post): 54.3 m[IU]/mL

## 2023-06-27 MED ORDER — AMLODIPINE BESYLATE 10 MG PO TABS
10.0000 mg | ORAL_TABLET | Freq: Every day | ORAL | Status: DC
  Administered 2023-06-27: 10 mg via ORAL
  Filled 2023-06-27: qty 1

## 2023-06-27 MED ORDER — HYDRALAZINE HCL 50 MG PO TABS
100.0000 mg | ORAL_TABLET | Freq: Three times a day (TID) | ORAL | Status: DC
  Administered 2023-06-27: 100 mg via ORAL
  Filled 2023-06-27 (×3): qty 2

## 2023-06-27 MED ORDER — ISOSORBIDE MONONITRATE ER 30 MG PO TB24
30.0000 mg | ORAL_TABLET | ORAL | Status: DC
Start: 2023-06-29 — End: 2023-06-27

## 2023-06-27 MED ORDER — CLONIDINE HCL 0.1 MG PO TABS
0.1000 mg | ORAL_TABLET | Freq: Three times a day (TID) | ORAL | Status: DC
Start: 2023-06-27 — End: 2023-06-27

## 2023-06-27 MED ORDER — ISOSORBIDE MONONITRATE ER 60 MG PO TB24
60.0000 mg | ORAL_TABLET | ORAL | Status: DC
  Administered 2023-06-27: 60 mg via ORAL
  Filled 2023-06-27: qty 1

## 2023-06-27 NOTE — Progress Notes (Signed)
Monique Henderson is an 73 y.o. female  with ESRD on HD TTS p/w dyspnea, hypoxia, severe hypertension. Recent [facility] admission on 1/2 -06/06/23 with pneumonia- completed course of antibiotics and then 1/16-1/18 with similar presentation  with increasing O2 requirement with CXR shows pulmonary edema/pneumonia.  Now with AMS after tripping on her high heels. H/O CVA with right-sided weakness but states that she is able to walk afterwards. Last outpt dialysis was Tues 1/21 and left at 43.6kg.  She missed treatment on Thursday.   Dialysis Orders:  AF TTS 3.5H 400/A1.5x 2K/2Ca TDC No heparin Mircera 225 q 2 wks (last given on 1/17, due 1/24), new EDW 41kg Hectrol 9 TIW Sensipar 60 three times per week  Assessment/Plan: Hyperkalemia 6.4 -secondary to missed dialysis treatment.  Temporizing with medical regimen in the emergency department and 2.5hr tx 1/24. Rapid response responding to IV fluids with total net UF 1.1 L.  Much more stable this morning and asking to go home.    ESRD -  TTS. Missed HD on Thursday. Left 2.6 kg over EDW.  She is listed at 47 kg but her true dry weight after leaving the hospital a week ago was 41 kg.  See above.  Since she had dialysis very late Fri night next dialysis was planned for Monday but she prefers to hold off till Tuesday and resume patient TTS regimen.  She is very stable and should be able to hold off till Tuesday for dialysis. I do not believe her true dry weight is 41 kg, likely a mistake.  Will list her dry weight as 44 kg for now and can always gently challenge as outpatient slowly as tolerated.  Hypertension/volume  - Severe HTN on admit.  Start home regimen and ultrafiltration will help as well.   Follow post HD   Anemia  - Hgb 10.2. No ESA needs currently   Metabolic bone disease -  Stable. Continue home meds as able.   Afib - on BB/Eliquis   Subjective: Rapid response called Fri night on dialysis for hypotension and unresponsiveness with systolics dropping  into the 50s.  Patient responded to fluid bolus and is very stable this morning.  Patient still asking to go home today.  Denies shortness of breath, chest pain, nausea, vomiting.   Chemistry and CBC: Creatinine  Date/Time Value Ref Range Status  09/28/2012 08:31 AM 1.2 (H) 0.6 - 1.1 mg/dL Final   Creat  Date/Time Value Ref Range Status  01/18/2017 11:45 AM 2.09 (H) 0.50 - 0.99 mg/dL Final    Comment:      For patients > or = 71 years of age: The upper reference limit for Creatinine is approximately 13% higher for people identified as African-American.     12/09/2016 02:58 PM 2.23 (H) 0.50 - 0.99 mg/dL Final    Comment:      For patients > or = 71 years of age: The upper reference limit for Creatinine is approximately 13% higher for people identified as African-American.     02/14/2014 09:51 AM 1.24 (H) 0.50 - 1.10 mg/dL Final  16/03/9603 54:09 AM 1.52 (H) 0.50 - 1.10 mg/dL Final  81/19/1478 29:56 AM 1.33 (H) 0.50 - 1.10 mg/dL Final  21/30/8657 84:69 AM 1.38 (H) 0.50 - 1.10 mg/dL Final  62/95/2841 32:44 PM 2.44 (H) 0.50 - 1.10 mg/dL Final  06/03/7251 66:44 PM 1.66 (H) 0.50 - 1.10 mg/dL Final  03/47/4259 56:38 AM 1.43 (H) 0.50 - 1.10 mg/dL Final   Creatinine, Ser  Date/Time Value Ref Range Status  06/27/2023 03:40 AM 10.59 (H) 0.44 - 1.00 mg/dL Final  16/03/9603 54:09 AM 8.57 (H) 0.44 - 1.00 mg/dL Final  81/19/1478 29:56 PM 13.79 (H) 0.44 - 1.00 mg/dL Final  21/30/8657 84:69 AM 9.29 (H) 0.44 - 1.00 mg/dL Final    Comment:    DELTA CHECK NOTED  06/18/2023 03:34 AM 5.77 (H) 0.44 - 1.00 mg/dL Final  62/95/2841 32:44 AM 8.86 (H) 0.44 - 1.00 mg/dL Final  06/03/7251 66:44 AM 10.94 (H) 0.44 - 1.00 mg/dL Final  03/47/4259 56:38 AM 8.83 (H) 0.44 - 1.00 mg/dL Final  75/64/3329 51:88 AM 6.34 (H) 0.44 - 1.00 mg/dL Final  41/66/0630 16:01 AM 8.94 (H) 0.44 - 1.00 mg/dL Final  09/32/3557 32:20 AM 16.81 (H) 0.44 - 1.00 mg/dL Final  25/42/7062 37:62 PM 8.86 (H) 0.44 - 1.00 mg/dL Final   83/15/1761 60:73 AM 14.71 (H) 0.44 - 1.00 mg/dL Final  71/10/2692 85:46 AM 14.57 (H) 0.44 - 1.00 mg/dL Final  27/07/5007 38:18 PM 3.77 (H) 0.44 - 1.00 mg/dL Final  29/93/7169 67:89 AM 6.29 (H) 0.44 - 1.00 mg/dL Final  38/03/1750 02:58 AM 10.52 (H) 0.44 - 1.00 mg/dL Final  52/77/8242 35:36 AM 9.87 (H) 0.44 - 1.00 mg/dL Final  14/43/1540 08:67 AM 10.54 (H) 0.44 - 1.00 mg/dL Final  61/95/0932 67:12 AM 8.44 (H) 0.44 - 1.00 mg/dL Final  45/80/9983 38:25 PM 14.20 (H) 0.44 - 1.00 mg/dL Final  05/39/7673 41:93 PM 12.90 (H) 0.44 - 1.00 mg/dL Final  79/07/4095 35:32 AM 2.95 (H) 0.44 - 1.00 mg/dL Final  99/24/2683 41:96 AM 4.33 (H) 0.44 - 1.00 mg/dL Final    Comment:    DELTA CHECK NOTED  01/15/2023 02:59 AM 1.10 (H) 0.44 - 1.00 mg/dL Final    Comment:    DIALYSIS  01/14/2023 09:34 PM 9.22 (H) 0.44 - 1.00 mg/dL Final  22/29/7989 21:19 PM 8.91 (H) 0.44 - 1.00 mg/dL Final  41/74/0814 48:18 AM 8.62 (H) 0.44 - 1.00 mg/dL Final  56/31/4970 26:37 AM 10.10 (H) 0.44 - 1.00 mg/dL Final  85/88/5027 74:12 AM 18.86 (H) 0.44 - 1.00 mg/dL Final  87/86/7672 09:47 AM 19.00 (H) 0.44 - 1.00 mg/dL Final  09/62/8366 29:47 AM 8.24 (H) 0.44 - 1.00 mg/dL Final  65/46/5035 46:56 AM 5.49 (H) 0.44 - 1.00 mg/dL Final  81/27/5170 01:74 AM 9.97 (H) 0.44 - 1.00 mg/dL Final  94/49/6759 16:38 PM 14.52 (H) 0.44 - 1.00 mg/dL Final  46/65/9935 70:17 AM 5.26 (H) 0.44 - 1.00 mg/dL Final  79/39/0300 92:33 AM 6.53 (H) 0.44 - 1.00 mg/dL Final    Comment:    DIALYSIS  04/29/2022 08:39 PM 15.48 (H) 0.44 - 1.00 mg/dL Final  00/76/2263 33:54 PM 9.82 (H) 0.44 - 1.00 mg/dL Final  56/25/6389 37:34 AM 10.18 (H) 0.44 - 1.00 mg/dL Final  28/76/8115 72:62 AM 7.38 (H) 0.44 - 1.00 mg/dL Final  03/55/9741 63:84 AM 11.35 (H) 0.44 - 1.00 mg/dL Final  53/64/6803 21:22 AM 7.22 (H) 0.44 - 1.00 mg/dL Final  48/25/0037 04:88 AM 7.92 (H) 0.44 - 1.00 mg/dL Final    Comment:    DELTA CHECK NOTED  02/26/2022 02:56 PM 4.56 (H) 0.44 - 1.00 mg/dL Final     Comment:    DIALYSIS DELTA CHECK NOTED   02/24/2022 04:11 AM 10.03 (H) 0.44 - 1.00 mg/dL Final  89/16/9450 38:88 PM 8.96 (H) 0.44 - 1.00 mg/dL Final  28/00/3491 79:15 AM 3.74 (H) 0.44 - 1.00 mg/dL Final    Comment:  DIALYSIS DELTA CHECK NOTED   01/29/2022 11:31 AM 8.80 (H) 0.44 - 1.00 mg/dL Final  28/41/3244 01:02 AM 7.97 (H) 0.44 - 1.00 mg/dL Final  72/53/6644 03:47 AM 9.06 (H) 0.44 - 1.00 mg/dL Final  42/59/5638 75:64 AM 6.44 (H) 0.44 - 1.00 mg/dL Final   Recent Labs  Lab 06/25/23 1430 06/26/23 1038 06/27/23 0340  NA 134* 134* 137  K 6.4* 4.4 4.5  CL 95* 94* 98  CO2 19* 25 23  GLUCOSE 104* 142* 121*  BUN 69* 46* 64*  CREATININE 13.79* 8.57* 10.59*  CALCIUM 9.0 8.4* 7.8*  PHOS  --  7.9*  --    Recent Labs  Lab 06/25/23 1430 06/26/23 1038 06/27/23 0340  WBC 5.2 3.8* 3.4*  NEUTROABS 4.2  --   --   HGB 10.2* 9.6* 9.2*  HCT 32.5* 31.3* 28.4*  MCV 92.9 93.7 91.0  PLT 298 251 283   Liver Function Tests: Recent Labs  Lab 06/25/23 1430 06/26/23 1038  AST 22  --   ALT 12  --   ALKPHOS 63  --   BILITOT 0.7  --   PROT 7.2  --   ALBUMIN 3.6 3.0*   No results for input(s): "LIPASE", "AMYLASE" in the last 168 hours. Recent Labs  Lab 06/25/23 1508  AMMONIA 16   Cardiac Enzymes: No results for input(s): "CKTOTAL", "CKMB", "CKMBINDEX", "TROPONINI" in the last 168 hours. Iron Studies: No results for input(s): "IRON", "TIBC", "TRANSFERRIN", "FERRITIN" in the last 72 hours. PT/INR: @LABRCNTIP (inr:5)  Xrays/Other Studies: ) Results for orders placed or performed during the hospital encounter of 06/25/23 (from the past 48 hours)  CBC with Differential     Status: Abnormal   Collection Time: 06/25/23  2:30 PM  Result Value Ref Range   WBC 5.2 4.0 - 10.5 K/uL   RBC 3.50 (L) 3.87 - 5.11 MIL/uL   Hemoglobin 10.2 (L) 12.0 - 15.0 g/dL   HCT 33.2 (L) 95.1 - 88.4 %   MCV 92.9 80.0 - 100.0 fL   MCH 29.1 26.0 - 34.0 pg   MCHC 31.4 30.0 - 36.0 g/dL   RDW 16.6 (H)  06.3 - 15.5 %   Platelets 298 150 - 400 K/uL   nRBC 0.0 0.0 - 0.2 %   Neutrophils Relative % 80 %   Neutro Abs 4.2 1.7 - 7.7 K/uL   Lymphocytes Relative 9 %   Lymphs Abs 0.5 (L) 0.7 - 4.0 K/uL   Monocytes Relative 9 %   Monocytes Absolute 0.5 0.1 - 1.0 K/uL   Eosinophils Relative 1 %   Eosinophils Absolute 0.0 0.0 - 0.5 K/uL   Basophils Relative 1 %   Basophils Absolute 0.0 0.0 - 0.1 K/uL   Immature Granulocytes 0 %   Abs Immature Granulocytes 0.02 0.00 - 0.07 K/uL    Comment: Performed at [facility], [address] [place], [place] [address]  Comprehensive metabolic panel     Status: Abnormal   Collection Time: 06/25/23  2:30 PM  Result Value Ref Range   Sodium 134 (L) 135 - 145 mmol/L   Potassium 6.4 (HH) 3.5 - 5.1 mmol/L    Comment: CRITICAL RESULT CALLED TO, READ BACK BY AND VERIFIED WITH K,RODGERS RN @1621  06/25/23 E,[place]   Chloride 95 (L) 98 - 111 mmol/L   CO2 19 (L) 22 - 32 mmol/L   Glucose, Bld 104 (H) 70 - 99 mg/dL    Comment: Glucose reference range applies only to samples taken after fasting  for at least 8 hours.   BUN 69 (H) 8 - 23 mg/dL   Creatinine, Ser 16.10 (H) 0.44 - 1.00 mg/dL   Calcium 9.0 8.9 - 96.0 mg/dL   Total Protein 7.2 6.5 - 8.1 g/dL   Albumin 3.6 3.5 - 5.0 g/dL   AST 22 15 - 41 U/L   ALT 12 0 - 44 U/L   Alkaline Phosphatase 63 38 - 126 U/L   Total Bilirubin 0.7 0.0 - 1.2 mg/dL   GFR, Estimated 3 (L) >60 mL/min    Comment: (NOTE) Calculated using the CKD-EPI Creatinine Equation (2021)    Anion gap 20 (H) 5 - 15    Comment: Performed at [facility], [address] [place], [place] [address]  Protime-INR     Status: None   Collection Time: 06/25/23  2:30 PM  Result Value Ref Range   Prothrombin Time 14.5 11.4 - 15.2 seconds   INR 1.1 0.8 - 1.2    Comment: (NOTE) INR goal varies based on device and disease states. Performed at [facility], [address] [place], [place] [address]   I-Stat Lactic Acid     Status: None    Collection Time: 06/25/23  2:38 PM  Result Value Ref Range   Lactic Acid, Venous 0.6 0.5 - 1.9 mmol/L  Ammonia     Status: None   Collection Time: 06/25/23  3:08 PM  Result Value Ref Range   Ammonia 16 9 - 35 umol/L    Comment: Performed at [facility], [address] [place], [place] [address]  I-Stat Lactic Acid     Status: None   Collection Time: 06/25/23  5:42 PM  Result Value Ref Range   Lactic Acid, Venous 1.9 0.5 - 1.9 mmol/L  Glucose, capillary     Status: Abnormal   Collection Time: 06/25/23 11:18 PM  Result Value Ref Range   Glucose-Capillary 104 (H) 70 - 99 mg/dL    Comment: Glucose reference range applies only to samples taken after fasting for at least 8 hours.  Hepatitis B surface antigen     Status: None   Collection Time: 06/26/23 10:38 AM  Result Value Ref Range   Hepatitis B Surface Ag NON REACTIVE NON REACTIVE    Comment: Performed at [facility], [address] [place], [place] [address]  CBC     Status: Abnormal   Collection Time: 06/26/23 10:38 AM  Result Value Ref Range   WBC 3.8 (L) 4.0 - 10.5 K/uL   RBC 3.34 (L) 3.87 - 5.11 MIL/uL   Hemoglobin 9.6 (L) 12.0 - 15.0 g/dL   HCT 08.6 (L) 57.8 - 46.9 %   MCV 93.7 80.0 - 100.0 fL   MCH 28.7 26.0 - 34.0 pg   MCHC 30.7 30.0 - 36.0 g/dL   RDW 62.9 (H) 52.8 - 41.3 %   Platelets 251 150 - 400 K/uL   nRBC 0.0 0.0 - 0.2 %    Comment: Performed at [facility], [address] [place], [place] [address]  Renal function panel     Status: Abnormal   Collection Time: 06/26/23 10:38 AM  Result Value Ref Range   Sodium 134 (L) 135 - 145 mmol/L   Potassium 4.4 3.5 - 5.1 mmol/L   Chloride 94 (L) 98 - 111 mmol/L   CO2 25 22 - 32 mmol/L   Glucose, Bld 142 (H) 70 - 99 mg/dL    Comment: Glucose  reference range applies only to samples taken after fasting for at least 8 hours.   BUN 46 (H) 8 - 23 mg/dL   Creatinine, Ser 7.82 (H) 0.44 - 1.00 mg/dL   Calcium 8.4 (L) 8.9 - 10.3 mg/dL   Phosphorus 7.9  (H) 2.5 - 4.6 mg/dL   Albumin 3.0 (L) 3.5 - 5.0 g/dL   GFR, Estimated 5 (L) >60 mL/min    Comment: (NOTE) Calculated using the CKD-EPI Creatinine Equation (2021)    Anion gap 15 5 - 15    Comment: Performed at [facility], [address], [place], [place] [address]  CBC     Status: Abnormal   Collection Time: 06/27/23  3:40 AM  Result Value Ref Range   WBC 3.4 (L) 4.0 - 10.5 K/uL   RBC 3.12 (L) 3.87 - 5.11 MIL/uL   Hemoglobin 9.2 (L) 12.0 - 15.0 g/dL   HCT 30.8 (L) 65.7 - 84.6 %   MCV 91.0 80.0 - 100.0 fL   MCH 29.5 26.0 - 34.0 pg   MCHC 32.4 30.0 - 36.0 g/dL   RDW 96.2 (H) 95.2 - 84.1 %   Platelets 283 150 - 400 K/uL   nRBC 0.0 0.0 - 0.2 %    Comment: Performed at [facility], [address], [place], [place] [address]  Basic metabolic panel     Status: Abnormal   Collection Time: 06/27/23  3:40 AM  Result Value Ref Range   Sodium 137 135 - 145 mmol/L   Potassium 4.5 3.5 - 5.1 mmol/L   Chloride 98 98 - 111 mmol/L   CO2 23 22 - 32 mmol/L   Glucose, Bld 121 (H) 70 - 99 mg/dL    Comment: Glucose reference range applies only to samples taken after fasting for at least 8 hours.   BUN 64 (H) 8 - 23 mg/dL   Creatinine, Ser 10.27 (H) 0.44 - 1.00 mg/dL   Calcium 7.8 (L) 8.9 - 10.3 mg/dL   GFR, Estimated 4 (L) >60 mL/min    Comment: (NOTE) Calculated using the CKD-EPI Creatinine Equation (2021)    Anion gap 16 (H) 5 - 15    Comment: Performed at [facility], [address] [place], [place] [address]  Magnesium     Status: None   Collection Time: 06/27/23  3:40 AM  Result Value Ref Range   Magnesium 2.2 1.7 - 2.4 mg/dL    Comment: Performed at [facility], [address] [place], [place] [address]   *Note: Due to a large number of results and/or encounters for the requested time period, some results have not been displayed. A complete set of results can be found in Results Review.   MR BRAIN WO CONTRAST Result Date: 06/25/2023 CLINICAL DATA:   Altered mental status, fall EXAM: MRI HEAD WITHOUT CONTRAST TECHNIQUE: Multiplanar, multiecho pulse sequences of the brain and surrounding structures were obtained without intravenous contrast. COMPARISON:  02/25/2022 MRI head, correlation is also made with 06/25/2023 CT head FINDINGS: Brain: No restricted diffusion to suggest acute or subacute infarct. No acute hemorrhage, mass, mass effect, or midline shift. No hydrocephalus or extra-axial collection. Pituitary and craniocervical junction within normal limits. Remote infarct in the left frontal and parietal cortex. Diffusely dilated perivascular spaces in the basal ganglia, thalami, and hippocampi. Confluent T2 hyperintense signal in the periventricular white matter, likely the sequela of severe chronic small vessel ischemic disease. Vascular: Normal arterial flow voids. Skull and upper cervical  spine: Normal marrow signal. Sinuses/Orbits: Clear paranasal sinuses. No acute finding in the orbits. Other: The mastoid air cells are well aerated. IMPRESSION: No acute intracranial process. No evidence of acute or subacute infarct. Electronically Signed   By: Wiliam Ke M.D.   On: 06/25/2023 19:14   DG Hand Complete Right Result Date: 06/25/2023 CLINICAL DATA:  Right hand pain after fall. EXAM: RIGHT HAND - COMPLETE 3+ VIEW COMPARISON:  Right hand x-rays dated June 10, 2006. FINDINGS: There is no evidence of fracture or dislocation. Scattered mild degenerative changes. Osteopenia. Soft tissues are unremarkable. IMPRESSION: 1. No acute osseous abnormality. Electronically Signed   By: Obie Dredge M.D.   On: 06/25/2023 16:48   CT Head Wo Contrast Result Date: 06/25/2023 CLINICAL DATA:  Mild EXAM: CT HEAD WITHOUT CONTRAST CT CERVICAL SPINE WITHOUT CONTRAST TECHNIQUE: Multidetector CT imaging of the head and cervical spine was performed following the standard protocol without intravenous contrast. Multiplanar CT image reconstructions of the cervical spine were  also generated. RADIATION DOSE REDUCTION: This exam was performed according to the departmental dose-optimization program which includes automated exposure control, adjustment of the mA and/or kV according to patient size and/or use of iterative reconstruction technique. COMPARISON:  None Available. FINDINGS: CT HEAD FINDINGS Brain: Remote appearing high left frontal cortical infarct. Patchy white matter hypodensities are compatible with chronic microvascular ischemic disease. No evidence of acute hemorrhage, mass lesion, midline shift or hydrocephalus. Vascular: No hyperdense vessel.  Calcific atherosclerosis. Skull: No acute fracture. Sinuses/Orbits: Clear sinuses.  No acute orbital findings. Other: No mastoid effusions. CT CERVICAL SPINE FINDINGS Alignment: No substantial sagittal subluxation. Skull base and vertebrae: No acute fracture. No primary bone lesion or focal pathologic process. Soft tissues and spinal canal: No prevertebral fluid or swelling. No visible canal hematoma. Disc levels: Moderate multilevel degenerative change including degenerative disc height loss and bridging anterior osteophytes at multiple levels. Facet and uncovertebral hypertrophy with varying degrees of neural foraminal stenosis. Upper chest: Visualized lung apices are clear.  Emphysema. IMPRESSION: 1. No evidence of acute abnormality intracranially in the cervical spine. 2. Remote appearing high left frontal cortical infarct and chronic microvascular ischemic disease. An MRI could provide more sensitive evaluation for acute infarct if clinically warranted. Electronically Signed   By: Feliberto Harts M.D.   On: 06/25/2023 14:53   CT Cervical Spine Wo Contrast Result Date: 06/25/2023 CLINICAL DATA:  Mild EXAM: CT HEAD WITHOUT CONTRAST CT CERVICAL SPINE WITHOUT CONTRAST TECHNIQUE: Multidetector CT imaging of the head and cervical spine was performed following the standard protocol without intravenous contrast. Multiplanar CT image  reconstructions of the cervical spine were also generated. RADIATION DOSE REDUCTION: This exam was performed according to the departmental dose-optimization program which includes automated exposure control, adjustment of the mA and/or kV according to patient size and/or use of iterative reconstruction technique. COMPARISON:  None Available. FINDINGS: CT HEAD FINDINGS Brain: Remote appearing high left frontal cortical infarct. Patchy white matter hypodensities are compatible with chronic microvascular ischemic disease. No evidence of acute hemorrhage, mass lesion, midline shift or hydrocephalus. Vascular: No hyperdense vessel.  Calcific atherosclerosis. Skull: No acute fracture. Sinuses/Orbits: Clear sinuses.  No acute orbital findings. Other: No mastoid effusions. CT CERVICAL SPINE FINDINGS Alignment: No substantial sagittal subluxation. Skull base and vertebrae: No acute fracture. No primary bone lesion or focal pathologic process. Soft tissues and spinal canal: No prevertebral fluid or swelling. No visible canal hematoma. Disc levels: Moderate multilevel degenerative change including degenerative disc height loss and bridging anterior osteophytes at  multiple levels. Facet and uncovertebral hypertrophy with varying degrees of neural foraminal stenosis. Upper chest: Visualized lung apices are clear.  Emphysema. IMPRESSION: 1. No evidence of acute abnormality intracranially in the cervical spine. 2. Remote appearing high left frontal cortical infarct and chronic microvascular ischemic disease. An MRI could provide more sensitive evaluation for acute infarct if clinically warranted. Electronically Signed   By: Feliberto Harts M.D.   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 Chevis Pretty; S/P re-excision of superior and inferior margins 10/27/2012.    Dissection of aorta (HCC) 04/03/2019   ESRD on hemodialysis (HCC) 05/29/2019   Essential hypertension 09/16/2006   GERD 09/16/2006   Hepatitis C    treated and RNA confirmed not detectable 01/2017   Insomnia 03/14/2015   Malnutrition of moderate degree 05/19/2019   Non compliance with medical treatment 12/04/2017   Normocytic anemia    With thrombocytosis   Osteoarthritis    Right ureteral stone 2002   S/P lumbar spinal fusion 01/18/2014   S/P lumbar decompressive laminectomy, fusion, and plating for lumbar spinal stenosis on 05/27/2009 by Dr. Tia Alert.  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 Tia Alert.     Stroke (cerebrum) (HCC)    Stroke (HCC) 02/23/2022   SVT (supraventricular tachycardia) (HCC) 08/28/2019   Tobacco use disorder 04/19/2009   Uterine fibroid    Wears dentures    top    PSH:   Past Surgical History:  Procedure Laterality Date   ANTERIOR LAT LUMBAR FUSION N/A 01/18/2014   Procedure: ANTERIOR LATERAL LUMBAR FUSION LUMBAR TWO-THREE;  Surgeon: Tia Alert, MD;  Location: [facility];  Service: Neurosurgery;  Laterality:  N/A;   ANTERIOR LUMBAR FUSION  01/18/2014   AV FISTULA PLACEMENT Left 04/20/2019   Procedure: ARTERIOVENOUS (AV) FISTULA CREATION;  Surgeon: Maeola Harman, MD;  Location: [facility] OR;  Service: Vascular;  Laterality: Left;   BACK SURGERY     BREAST LUMPECTOMY Left 01/2003   BREAST LUMPECTOMY Right 2014   BREAST LUMPECTOMY WITH NEEDLE LOCALIZATION AND AXILLARY SENTINEL LYMPH NODE BX Right 10/13/2012   Procedure: BREAST LUMPECTOMY WITH NEEDLE LOCALIZATION;  Surgeon: Robyne Askew, MD;  Location: [facility];  Service: General;  Laterality: Right;  Right breast wire localized lumpectomy   DIALYSIS/PERMA CATHETER INSERTION N/A 05/28/2023   Procedure: DIALYSIS/PERMA CATHETER INSERTION;  Surgeon: Ethelene Hal, MD;  Location: [facility];  Service: Cardiovascular;  Laterality: N/A;   INSERTION OF DIALYSIS CATHETER  Right 04/20/2019   Procedure: INSERTION OF DIALYSIS CATHETER, right internal jugular;  Surgeon: Maeola Harman, MD;  Location: [facility] OR;  Service: Vascular;  Laterality: Right;   INSERTION OF DIALYSIS CATHETER Right 09/24/2020   Procedure: INSERTION OF TUNNELED DIALYSIS CATHETER;  Surgeon: Larina Earthly, MD;  Location: [facility] OR;  Service: Vascular;  Laterality: Right;   IR FLUORO GUIDE CV LINE RIGHT  09/22/2020   IR THORACENTESIS ASP PLEURAL SPACE W/IMG GUIDE  05/19/2019   IR US GUIDE VASC ACCESS LEFT  09/22/2020   IR US GUIDE VASC ACCESS RIGHT  09/22/2020   IR VENOCAVAGRAM SVC  09/22/2020   LAMINECTOMY  05/27/2009   Lumbar decompressive laminectomy, fusion and plating for lumbar spinal stensosis   LIGATION OF ARTERIOVENOUS  FISTULA Left 09/24/2020   Procedure: LIGATION OF LEFT ARM ARTERIOVENOUS  FISTULA;  Surgeon: Larina Earthly, MD;  Location: [facility] OR;  Service: Vascular;  Laterality: Left;   LUMBAR LAMINECTOMY/DECOMPRESSION MICRODISCECTOMY Left 03/23/2013   Procedure: LUMBAR LAMINECTOMY/DECOMPRESSION MICRODISCECTOMY 1 LEVEL;  Surgeon: Tia Alert, MD;  Location:  [facility];  Service: Neurosurgery;  Laterality: Left;  LUMBAR LAMINECTOMY/DECOMPRESSION MICRODISCECTOMY 1 LEVEL   MASTECTOMY, PARTIAL Left 02/26/2003   ; S/P re-excision of cranial and lateral margins 04/19/2003.    RE-EXCISION OF BREAST CANCER,SUPERIOR MARGINS Right 10/27/2012   Procedure: RE-EXCISION OF BREAST CANCER,SUPERIOR and inferior MARGINS;  Surgeon: Robyne Askew, MD;  Location: [facility] OR;  Service: General;  Laterality: Right;   RE-EXCISION OF BREAST LUMPECTOMY Left 04/2003   TEE WITHOUT CARDIOVERSION N/A 04/04/2019   Procedure: Transesophageal Echocardiogram (Tee);  Surgeon: Linden Dolin, MD;  Location: [facility] OR;  Service: Open Heart Surgery;  Laterality: N/A;   THORACIC AORTIC ANEURYSM REPAIR N/A 04/04/2019   Procedure: THORACIC ASCENDING ANEURYSM REPAIR (AAA)  USING 28 MM X 30 CM HEMASHIELD PLATINUM VASCULAR GRAFT;  Surgeon: Linden Dolin, MD;  Location: [facility] OR;  Service: Open Heart Surgery;  Laterality: N/A;    Allergies:  Allergies  Allergen Reactions   Shrimp [Shellfish Allergy] Shortness Of Breath   Bactroban [Mupirocin] Other (See Comments)    "Sores in nose"   Hydrocodone Itching, Nausea Only and Nausea And Vomiting   Latex Itching    Latex gloves cause itchiness and a rash   Chlorhexidine Gluconate Itching   Tylenol [Acetaminophen] Itching    Takes Percocet at home 05/06/23   Zestril [Lisinopril] Cough    Medications:   Prior to Admission medications   Medication Sig Start Date End Date Taking? Authorizing Provider  albuterol (PROVENTIL) (2.5 MG/3ML) 0.083% nebulizer solution Take 2.5 mg by nebulization every 6 (six) hours as needed for wheezing or shortness of breath.   Yes [provider]  albuterol (VENTOLIN HFA) 108 (90 Base) MCG/ACT inhaler Inhale 2 puffs into the lungs every 6 (six) hours as needed for wheezing or shortness of breath. 07/30/20  Yes Janeece Agee, NP  amLODipine (NORVASC) 10 MG tablet Take 1 tablet (10 mg total) by mouth daily.  03/06/22  Yes Setzer, Lynnell Jude, PA-C  apixaban (ELIQUIS) 2.5 MG TABS tablet Take 1 tablet (2.5 mg total) by mouth 2 (two) times daily. 03/06/22  Yes Setzer, Lynnell Jude, PA-C  carvedilol (COREG) 25 MG tablet Take 1 tablet (25 mg total) by mouth 2 (two) times daily with a meal. 05/01/22 06/16/24 Yes Ghimire, Werner Lean, MD  Cholecalciferol (VITAMIN D-3 PO) Take 1 capsule by mouth daily.   Yes [provider]  cloNIDine (CATAPRES) 0.1 MG tablet Take  1 tablet (0.1 mg total) by mouth 3 (three) times daily. 06/19/23  Yes Hughie Closs, MD  hydrALAZINE (APRESOLINE) 100 MG tablet Take 1 tablet (100 mg total) by mouth every 8 (eight) hours. 12/18/22  Yes Corky Crafts, MD  isosorbide mononitrate (IMDUR) 30 MG 24 hr tablet TAKE 1 TABLET (30MG ) ON DIALYSIS DAYS AND 2 TABLETS (60 MG) ON NON DIALYSIS DAYS. 01/12/23  Yes Corky Crafts, MD  naloxone [facility]) nasal spray 4 mg/0.1 mL 1 spray as directed. 02/19/22  Yes [provider]  oxyCODONE-acetaminophen (PERCOCET) 10-325 MG tablet Take 1 tablet by mouth in the morning and at bedtime. 03/12/22  Yes [provider]  pantoprazole (PROTONIX) 40 MG tablet TAKE 1 TABLET (40 MG TOTAL) BY MOUTH 2 (TWO) TIMES DAILY. 05/13/23  Yes Georganna Skeans, MD  rosuvastatin (CRESTOR) 10 MG tablet TAKE 1 TABLET (10 MG TOTAL) BY MOUTH DAILY FOR CHOLESTEROL 08/07/22  Yes Georganna Skeans, MD  senna-docusate (SENOKOT-S) 8.6-50 MG tablet Take 1 tablet by mouth 2 (two) times daily between meals as needed for mild constipation. 08/14/22  Yes Almon Hercules, MD  sevelamer carbonate (RENVELA) 2.4 g PACK Take 2.4 g by mouth 3 (three) times daily with meals. 03/06/22  Yes Setzer, Lynnell Jude, PA-C  traZODone (DESYREL) 50 MG tablet Take 25-50 mg by mouth at bedtime as needed for sleep. 03/19/23  Yes [provider]    Discontinued Meds:   Medications Discontinued During This Encounter  Medication Reason   sodium chloride 0.9 % bolus 500 mL    diltiazem  (CARDIZEM) 125 mg in dextrose 5% 125 mL (1 mg/mL) infusion     Social History:  reports that she quit smoking about 3 years ago. Her smoking use included cigarettes. She started smoking about 47 years ago. She has a 11 pack-year smoking history. She has never used smokeless tobacco. She reports that she does not currently use alcohol after a past usage of about 2.0 standard drinks of alcohol per week. She reports that she does not currently use drugs after having used the following drugs: Cocaine.  Family History:   Family History  Problem Relation Age of Onset   Colon cancer Mother 62   Hypertension Mother    Diabetes Sister 2   Hypertension Sister    Diabetes Brother    Hypertension Brother    Diabetes Brother    Hypertension Brother    Kidney disease Son        s/p renal transplant   Hypertension Son    Diabetes Son    Multiple sclerosis Son    Bone cancer Sister 68   Breast cancer Neg Hx    Cervical cancer Neg Hx     Blood pressure (!) 154/84, pulse 73, temperature 98.1 F (36.7 C), temperature source Oral, resp. rate 20, height 5\' 3"  (1.6 m), weight 40.3 kg, SpO2 97%. Physical Exam: General: Comfortable appearing Head: NCAT sclera not icteric MMM Neck: Supple.  Lungs: Diminished at bases  Heart: RRR Abdomen: SNDNT Lower extremities:without edema  Neuro: A & O X 3. Moves all extremities spontaneously certainly weakness on the right side. Psych:  Responds to questions appropriately  Dialysis Access: R internal jugular TDC      Ysmael Hires, Len Blalock, MD 06/27/2023, 12:21 PM

## 2023-06-27 NOTE — TOC Transition Note (Signed)
Transition of Care [facility]) - Discharge Note   Patient Details  Name: Monique Henderson MRN: [id number] Date of Birth: 06-02-1950  Transition of Care [facility]) CM/SW Contact:  Ronny Bacon, RN Phone Number: 06/27/2023, 3:56 PM   Clinical Narrative:   Patient is being discharged today. Outpatient PT/OT referral for [facility] # [id number].    Final next level of care: Home/Self Care Barriers to Discharge: No Barriers Identified   Patient Goals and CMS Choice Patient states their goals for this hospitalization and ongoing recovery are:: To return home with my boyfriend CMS Medicare.gov Compare Post Acute Care list provided to:: Other (Comment Required) (Medicare.gov list was offered but pt declined)        Discharge Placement                       Discharge Plan and Services Additional resources added to the After Visit Summary for                                       Social Drivers of Health (SDOH) Interventions SDOH Screenings   Food Insecurity: No Food Insecurity (06/26/2023)  Housing: Low Risk  (06/26/2023)  Transportation Needs: No Transportation Needs (06/26/2023)  Utilities: Not At Risk (06/26/2023)  Depression (PHQ2-9): Low Risk  (06/17/2022)  Recent Concern: Depression (PHQ2-9) - Medium Risk (05/22/2022)  Financial Resource Strain: Low Risk  (04/14/2021)   Received from [facility], [facility]  Physical Activity: Inactive (04/14/2021)   Received from [facility], [facility]  Social Connections: Socially Isolated (06/26/2023)  Stress: Stress Concern Present (04/14/2021)   Received from [facility], [facility]  Tobacco Use: Medium Risk (06/26/2023)     Readmission Risk Interventions    06/03/2023    3:11 PM 01/15/2023   12:35 PM 01/09/2022    4:28 PM  Readmission Risk Prevention Plan  Transportation Screening Complete Complete Complete  Medication Review (RN Care Manager) Referral to Pharmacy Referral to Pharmacy Complete  PCP  or Specialist appointment within 3-5 days of discharge Complete Complete Complete  HRI or Home Care Consult Complete Complete Complete  SW Recovery Care/Counseling Consult Complete Complete Complete  Palliative Care Screening Not Applicable Not Applicable Complete  Skilled Nursing Facility Not Applicable Not Applicable Not Applicable

## 2023-06-27 NOTE — TOC Initial Note (Signed)
Transition of Care [facility]) - Initial/Assessment Note    Patient Details  Name: Monique Henderson MRN: [id number] Date of Birth: [birth year]  [facility]) CM/SW Contact:    Jimmy Picket, LCSW Phone Number: 06/27/2023, 11:02 AM  Clinical Narrative:                  CSW received SNF consult. CSW met with pt at bedside. CSW introduced self and explained role at the hospital. Pt reports that PTA she lives at home with her boyfriend. Pt reports she was mostly independent at home.   CSW reviewed PT/OT recommendations for SNF. Pt states that she's not going to a SNF and will return home with her boyfriend. Pt states she's wanted CSW to leave so she can take a nap.   CSW will continue to follow.   Expected Discharge Plan: Home w Home Health Services Barriers to Discharge: Continued Medical Work up   Patient Goals and CMS Choice Patient states their goals for this hospitalization and ongoing recovery are:: To return home with my boyfriend CMS Medicare.gov Compare Post Acute Care list provided to:: Other (Comment Required) (Medicare.gov list was offered but pt declined)        Expected Discharge Plan and Services       Living arrangements for the past 2 months: Single Family Home                                      Prior Living Arrangements/Services Living arrangements for the past 2 months: Single Family Home Lives with:: Significant Other Patient language and need for interpreter reviewed:: Yes Do you feel safe going back to the place where you live?: Yes      Need for Family Participation in Patient Care: Yes (Comment) Care giver support system in place?: Yes (comment)   Criminal Activity/Legal Involvement Pertinent to Current Situation/Hospitalization: No - Comment as needed  Activities of Daily Living   ADL Screening (condition at time of admission) Independently performs ADLs?: Yes (appropriate for developmental age) Is the patient deaf or have  difficulty hearing?: No Does the patient have difficulty seeing, even when wearing glasses/contacts?: No Does the patient have difficulty concentrating, remembering, or making decisions?: No  Permission Sought/Granted Permission sought to share information with : Case Manager Permission granted to share information with : Yes, Verbal Permission Granted     Permission granted to share info w AGENCY: HH agencies        Emotional Assessment Appearance:: Appears stated age Attitude/Demeanor/Rapport: Engaged Affect (typically observed): Appropriate Orientation: : Oriented to Self, Oriented to Place, Oriented to  Time Alcohol / Substance Use: Not Applicable Psych Involvement: No (comment)  Admission diagnosis:  History of stroke [Z86.73] Atrial fibrillation with RVR (HCC) [I48.91] Fall, initial encounter [W19.XXXA] Speech disturbance, unspecified type [R47.9] Patient Active Problem List   Diagnosis Date Noted   Atrial fibrillation with RVR (HCC) 06/26/2023   Acute on chronic respiratory failure with hypoxia (HCC) 06/17/2023   Paroxysmal atrial flutter (HCC) 06/05/2023   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   Elevated troponin level not due to acute coronary syndrome 06/05/2023   History of aortic dissection 06/05/2023   History of cocaine abuse (HCC) 06/05/2023   Noncompliance with medication regimen 06/05/2023   End stage renal disease (HCC) 06/05/2023   Chronic heart failure with mildly reduced ejection fraction (HFmrEF, 41-49%) (HCC) 06/05/2023   Long term (current) use  of anticoagulants 06/05/2023   Acute hypoxic respiratory failure (HCC) 06/02/2023   Acute respiratory failure with hypoxia (HCC) 05/07/2023   Acute hypoxemic respiratory failure (HCC) 05/06/2023   Advance care planning 01/15/2023   Hypercoagulable state due to paroxysmal atrial fibrillation (HCC) 10/21/2022   Right shoulder pain 06/17/2022   Edema of right upper arm 06/17/2022   Hemiparesis affecting right  side as late effect of cerebrovascular accident (CVA) (HCC) 04/06/2022   Hypervolemia associated with renal insufficiency 01/29/2022   Dyslipidemia 01/29/2022   Polysubstance abuse (HCC) 01/06/2022   Acute on chronic combined systolic and diastolic CHF (congestive heart failure) (HCC) 01/06/2022   History of hepatitis C 12/08/2021   Hypertensive urgency 10/29/2021   Fluid overload 10/29/2021   Acute on chronic HFrEF (heart failure with reduced ejection fraction) (HCC) - BNP does NOT correlate to her level of volume overload 10/29/2021   Chronic anticoagulation 10/29/2021   Prolonged QT interval 10/29/2021   Hypertensive emergency 08/23/2021   Anemia of renal disease 06/05/2021   Chronic diastolic congestive heart failure (HCC) 04/30/2021   Pure hypercholesterolemia 02/14/2021   Metabolic acidosis with increased anion gap and accumulation of organic acids 09/21/2020   Hypertension 09/21/2020   Hyperkalemia 08/28/2019   Paroxysmal SVT (supraventricular tachycardia) (HCC) 08/27/2019   Aortic atherosclerosis (HCC) 07/05/2019   History of CVA (cerebrovascular accident) 05/29/2019   Paroxysmal atrial fibrillation (HCC) 05/29/2019   ESRD (end stage renal disease) (HCC) 05/29/2019   Malnutrition of moderate degree 05/19/2019   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 04/07/2019   Non compliance with medical treatment 12/04/2017   Chronic obstructive lung disease (HCC) 01/16/2017   Chronic low back pain 06/22/2016   Insomnia 03/14/2015   S/P lumbar spinal fusion 01/18/2014   Tobacco use disorder 04/19/2009   GERD 09/16/2006   PCP:  Fleet Contras, MD Pharmacy:   [facility] - [place], [place] - [address] [place] [address] Phone: [phone] Fax: [phone]     Social Drivers of Health (SDOH) Social History: SDOH Screenings   Food Insecurity: No Food Insecurity (06/26/2023)  Housing: Low Risk  (06/26/2023)  Transportation Needs: No  Transportation Needs (06/26/2023)  Utilities: Not At Risk (06/26/2023)  Depression (PHQ2-9): Low Risk  (06/17/2022)  Recent Concern: Depression (PHQ2-9) - Medium Risk (05/22/2022)  Financial Resource Strain: Low Risk  (04/14/2021)   Received from [facility], [facility]  Physical Activity: Inactive (04/14/2021)   Received from [facility], [facility]  Social Connections: Socially Isolated (06/26/2023)  Stress: Stress Concern Present (04/14/2021)   Received from [facility], [facility]  Tobacco Use: Medium Risk (06/26/2023)   SDOH Interventions:     Readmission Risk Interventions    06/03/2023    3:11 PM 01/15/2023   12:35 PM 01/09/2022    4:28 PM  Readmission Risk Prevention Plan  Transportation Screening Complete Complete Complete  Medication Review (RN Care Manager) Referral to Pharmacy Referral to Pharmacy Complete  PCP or Specialist appointment within 3-5 days of discharge Complete Complete Complete  HRI or Home Care Consult Complete Complete Complete  SW Recovery Care/Counseling Consult Complete Complete Complete  Palliative Care Screening Not Applicable Not Applicable Complete  Skilled Nursing Facility Not Applicable Not Applicable Not Applicable

## 2023-06-27 NOTE — Discharge Summary (Incomplete)
Physician Discharge Summary  Monique Henderson NGE:[id number] DOB: Feb 07, 1951 DOA: 06/25/2023  PCP: Fleet Contras, MD  Admit date: 06/25/2023 Discharge date: 06/28/2023  Admitted From: Home  Discharge disposition: Home with outpatient PT.  Recommendations for Outpatient Follow-Up:   Follow up with your primary care provider in one week.  Check CBC, BMP, magnesium in the next visit  Discharge Diagnosis:   Active Problems:   Atrial fibrillation with RVR (HCC)  Discharge Condition: Improved.  Diet recommendation: Low sodium, heart healthy.   Wound care: None.  Code status: DNR   History of Present Illness:   Monique Henderson is a 73 y.o. female with past medical history significant for ESRD on HD TTS, chronic A-fib on Eliquis, hypertension, hyperlipidemia, COPD, history of right breast cancer status post post surgery in 2014, GERD, hepatitis C treated in 2015, moderate protein calorie malnutrition , history of stroke with right-sided deficits, chronic lower back pain status post lumbar spinal fusion surgery in 2010, presented to hospital after being found down for more than 2 days missing hemodialysis sessions.  In the ED, patient was tachycardic, tachypneic and serum potassium was more than 6 and was urgently taken to hemodialysis.  She was also noted to be in atrial fibrillation with RVR and Cardizem drip was initiated.  Initial labs were notable for tachypnea and pulse ox of 96% on room air with tachycardia up to 148.  Labs were notable for mild hyponatremia with sodium of 134 and hyperkalemia with potassium of 6.4 and serum bicarb was low at 19 with serum creatinine at 15.7.  Patient was then admitted hospital for further evaluation and treatment.  Hospital Course:   Following conditions were addressed during hospitalization as listed below,  Chronic atrial fibrillation with RVR Currently rate controlled.  Patient was started on Cardizem drip in the ED initially,  subsequently started on Coreg.  At this time, rate is controlled and will be continued on Coreg on discharge.    Hyperkalemia secondary to missed hemodialysis. Treated with Lokelma and hemodialysis, repeat BMP with potassium of 4.5.  Seen by nephrology today.  Plan for dialysis Tuesday Thursday Saturday.   Mild non-anion gap metabolic acidosis in the setting of missed hemodialysis Anion gap 20 and serum bicarb 19 on presentation.  Patient received hemodialysis during hospitalization.  Potassium has normalized at this time.  Seen by nephrology for hemodialysis.Marland Kitchen     History of CVA with right-sided deficit Continue fall precautions, PT OT.  Continue Eliquis.   Essential Hypertension Will resume home antihypertensives including Coreg, clonidine, hydralazine, amlodipine.   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 Volume status managed with hemodialysis.  Will continue hemodialysis as outpatient.   Generalized weakness and fall CT scan of the head and MRI of the brain was negative for acute findings.  Patient was seen by physical therapy who recommended to skilled nursing facility but patient has refused SNF.  She wishes to go home and be with her her boyfriend which can help her.  Communicated with TOC about it.  Disposition.  At this time, patient is stable for disposition home with home health.  Medical Consultants:   None.  Procedures:    None Subjective:   Today, patient was seen and examined at bedside.  Feels okay.  Wishes to go home despite recommendations for his nursing facility..  Discharge Exam:   Vitals:   06/27/23 1223 06/27/23 1237  BP: (!) 152/96 (!) 152/96  Pulse:    Resp:    Temp: 98 F (36.7 C)   SpO2:     Vitals:   06/27/23 0757 06/27/23 0925 06/27/23 1223 06/27/23 1237  BP:  (!) 154/84 (!) 152/96 (!) 152/96  Pulse:      Resp:      Temp: 98.1 F (36.7  C)  98 F (36.7 C)   TempSrc: Oral  Oral   SpO2:      Weight:      Height:       Body mass index is 15.75 kg/m.   General:  not in obvious distress, thinly built, alert awake and oriented HENT: pupils equally reacting to light,  No scleral pallor or icterus noted. Oral mucosa is moist.  Chest:  Clear breath sounds.  Diminished breath sounds bilaterally. No crackles or wheezes.  CVS: S1 &S2 heard. No murmur.  Regular rate and rhythm. Abdomen: Soft, nontender, nondistended.  Bowel sounds are heard.   Extremities: No cyanosis, clubbing or edema.  Peripheral pulses are palpable. Psych: Alert, awake and oriented, normal mood slurred speech, CNS:  No cranial nerve deficits.  Moves extremities, mild right-sided weakness. Skin: Warm and dry.  No rashes noted.  The results of significant diagnostics from this hospitalization (including imaging, microbiology, ancillary and laboratory) are listed below for reference.     Diagnostic Studies:   MR BRAIN WO CONTRAST Result Date: 06/25/2023 CLINICAL DATA:  Altered mental status, fall EXAM: MRI HEAD WITHOUT CONTRAST TECHNIQUE: Multiplanar, multiecho pulse sequences of the brain and surrounding structures were obtained without intravenous contrast. COMPARISON:  02/25/2022 MRI head, correlation is also made with 06/25/2023 CT head FINDINGS: Brain: No restricted diffusion to suggest acute or subacute infarct. No acute hemorrhage, mass, mass effect, or midline shift. No hydrocephalus or extra-axial collection. Pituitary and craniocervical junction within normal limits. Remote infarct in the left frontal and parietal cortex. Diffusely dilated perivascular spaces in the basal ganglia, thalami, and hippocampi. Confluent T2 hyperintense signal in the periventricular white matter, likely the sequela of severe chronic small vessel ischemic disease. Vascular: Normal arterial flow voids. Skull and upper cervical spine: Normal marrow signal. Sinuses/Orbits: Clear  paranasal sinuses. No acute finding in the orbits. Other: The mastoid air cells are well aerated. IMPRESSION: No acute intracranial process. No evidence of acute or subacute infarct. Electronically Signed   By: Wiliam Ke M.D.   On: 06/25/2023 19:14   DG Hand Complete Right Result Date: 06/25/2023 CLINICAL DATA:  Right hand pain after fall. EXAM: RIGHT HAND - COMPLETE 3+ VIEW COMPARISON:  Right hand x-rays dated June 10, 2006. FINDINGS: There is no evidence of fracture or dislocation. Scattered mild degenerative changes. Osteopenia. Soft tissues are unremarkable. IMPRESSION: 1. No acute osseous abnormality. Electronically Signed   By: Obie Dredge M.D.   On: 06/25/2023 16:48   CT Head Wo Contrast Result Date: 06/25/2023 CLINICAL DATA:  Mild EXAM: CT HEAD WITHOUT CONTRAST CT CERVICAL SPINE WITHOUT CONTRAST TECHNIQUE: Multidetector CT imaging of the head and cervical spine was performed following the standard protocol without intravenous contrast. Multiplanar CT image reconstructions of the cervical spine were also generated. RADIATION DOSE REDUCTION: This exam was performed according to the departmental dose-optimization program which includes automated exposure control, adjustment of the mA and/or kV according to patient size and/or use of iterative reconstruction technique. COMPARISON:  None Available. FINDINGS: CT HEAD FINDINGS Brain: Remote appearing high left frontal cortical infarct. Patchy white matter hypodensities are compatible with chronic microvascular  ischemic disease. No evidence of acute hemorrhage, mass lesion, midline shift or hydrocephalus. Vascular: No hyperdense vessel.  Calcific atherosclerosis. Skull: No acute fracture. Sinuses/Orbits: Clear sinuses.  No acute orbital findings. Other: No mastoid effusions. CT CERVICAL SPINE FINDINGS Alignment: No substantial sagittal subluxation. Skull base and vertebrae: No acute fracture. No primary bone lesion or focal pathologic process. Soft  tissues and spinal canal: No prevertebral fluid or swelling. No visible canal hematoma. Disc levels: Moderate multilevel degenerative change including degenerative disc height loss and bridging anterior osteophytes at multiple levels. Facet and uncovertebral hypertrophy with varying degrees of neural foraminal stenosis. Upper chest: Visualized lung apices are clear.  Emphysema. IMPRESSION: 1. No evidence of acute abnormality intracranially in the cervical spine. 2. Remote appearing high left frontal cortical infarct and chronic microvascular ischemic disease. An MRI could provide more sensitive evaluation for acute infarct if clinically warranted. Electronically Signed   By: Feliberto Harts M.D.   On: 06/25/2023 14:53   CT Cervical Spine Wo Contrast Result Date: 06/25/2023 CLINICAL DATA:  Mild EXAM: CT HEAD WITHOUT CONTRAST CT CERVICAL SPINE WITHOUT CONTRAST TECHNIQUE: Multidetector CT imaging of the head and cervical spine was performed following the standard protocol without intravenous contrast. Multiplanar CT image reconstructions of the cervical spine were also generated. RADIATION DOSE REDUCTION: This exam was performed according to the departmental dose-optimization program which includes automated exposure control, adjustment of the mA and/or kV according to patient size and/or use of iterative reconstruction technique. COMPARISON:  None Available. FINDINGS: CT HEAD FINDINGS Brain: Remote appearing high left frontal cortical infarct. Patchy white matter hypodensities are compatible with chronic microvascular ischemic disease. No evidence of acute hemorrhage, mass lesion, midline shift or hydrocephalus. Vascular: No hyperdense vessel.  Calcific atherosclerosis. Skull: No acute fracture. Sinuses/Orbits: Clear sinuses.  No acute orbital findings. Other: No mastoid effusions. CT CERVICAL SPINE FINDINGS Alignment: No substantial sagittal subluxation. Skull base and vertebrae: No acute fracture. No primary  bone lesion or focal pathologic process. Soft tissues and spinal canal: No prevertebral fluid or swelling. No visible canal hematoma. Disc levels: Moderate multilevel degenerative change including degenerative disc height loss and bridging anterior osteophytes at multiple levels. Facet and uncovertebral hypertrophy with varying degrees of neural foraminal stenosis. Upper chest: Visualized lung apices are clear.  Emphysema. IMPRESSION: 1. No evidence of acute abnormality intracranially in the cervical spine. 2. Remote appearing high left frontal cortical infarct and chronic microvascular ischemic disease. An MRI could provide more sensitive evaluation for acute infarct if clinically warranted. Electronically Signed   By: Feliberto Harts M.D.   On: 06/25/2023 14:53     Labs:   Basic Metabolic Panel: Recent Labs  Lab 06/25/23 1430 06/26/23 1038 06/27/23 0340  NA 134* 134* 137  K 6.4* 4.4 4.5  CL 95* 94* 98  CO2 19* 25 23  GLUCOSE 104* 142* 121*  BUN 69* 46* 64*  CREATININE 13.79* 8.57* 10.59*  CALCIUM 9.0 8.4* 7.8*  MG  --   --  2.2  PHOS  --  7.9*  --    GFR Estimated Creatinine Clearance: 3.1 mL/min (A) (by C-G formula based on SCr of 10.59 mg/dL (H)). Liver Function Tests: Recent Labs  Lab 06/25/23 1430 06/26/23 1038  AST 22  --   ALT 12  --   ALKPHOS 63  --   BILITOT 0.7  --   PROT 7.2  --   ALBUMIN 3.6 3.0*   No results for input(s): "LIPASE", "AMYLASE" in the last 168 hours. Recent Labs  Lab 06/25/23 1508  AMMONIA 16   Coagulation profile Recent Labs  Lab 06/25/23 1430  INR 1.1    CBC: Recent Labs  Lab 06/25/23 1430 06/26/23 1038 06/27/23 0340  WBC 5.2 3.8* 3.4*  NEUTROABS 4.2  --   --   HGB 10.2* 9.6* 9.2*  HCT 32.5* 31.3* 28.4*  MCV 92.9 93.7 91.0  PLT 298 251 283   Cardiac Enzymes: No results for input(s): "CKTOTAL", "CKMB", "CKMBINDEX", "TROPONINI" in the last 168 hours. BNP: Invalid input(s): "POCBNP" CBG: Recent Labs  Lab 06/25/23 2318   GLUCAP 104*   D-Dimer No results for input(s): "DDIMER" in the last 72 hours. Hgb A1c No results for input(s): "HGBA1C" in the last 72 hours. Lipid Profile No results for input(s): "CHOL", "HDL", "LDLCALC", "TRIG", "CHOLHDL", "LDLDIRECT" in the last 72 hours. Thyroid function studies No results for input(s): "TSH", "T4TOTAL", "T3FREE", "THYROIDAB" in the last 72 hours.  Invalid input(s): "FREET3" Anemia work up No results for input(s): "VITAMINB12", "FOLATE", "FERRITIN", "TIBC", "IRON", "RETICCTPCT" in the last 72 hours. Microbiology No results found for this or any previous visit (from the past 240 hours).   Discharge Instructions:   Discharge Instructions     Call MD for:  persistant nausea and vomiting   Complete by: As directed    Call MD for:  severe uncontrolled pain   Complete by: As directed    Call MD for:  temperature >100.4   Complete by: As directed    Diet - low sodium heart healthy   Complete by: As directed    Discharge instructions   Complete by: As directed    Follow-up with your primary care provider in 1 week.  Check blood work at that time.  Take precautions while ambulating.  Continue therapy at home.   Increase activity slowly   Complete by: As directed       Allergies as of 06/27/2023       Reactions   Shrimp [shellfish Allergy] Shortness Of Breath   Bactroban [mupirocin] Other (See Comments)   "Sores in nose"   Hydrocodone Itching, Nausea Only, Nausea And Vomiting   Latex Itching   Latex gloves cause itchiness and a rash   Chlorhexidine Gluconate Itching   Tylenol [acetaminophen] Itching   Takes Percocet at home 05/06/23   Zestril [lisinopril] Cough        Medication List     TAKE these medications    albuterol (2.5 MG/3ML) 0.083% nebulizer solution Commonly known as: PROVENTIL Take 2.5 mg by nebulization every 6 (six) hours as needed for wheezing or shortness of breath.   albuterol 108 (90 Base) MCG/ACT inhaler Commonly known  as: VENTOLIN HFA Inhale 2 puffs into the lungs every 6 (six) hours as needed for wheezing or shortness of breath.   amLODipine 10 MG tablet Commonly known as: NORVASC Take 1 tablet (10 mg total) by mouth daily.   apixaban 2.5 MG Tabs tablet Commonly known as: ELIQUIS Take 1 tablet (2.5 mg total) by mouth 2 (two) times daily.   carvedilol 25 MG tablet Commonly known as: COREG Take 1 tablet (25 mg total) by mouth 2 (two) times daily with a meal.   cloNIDine 0.1 MG tablet Commonly known as: Catapres Take 1 tablet (0.1 mg total) by mouth 3 (three) times daily.   hydrALAZINE 100 MG tablet Commonly known as: APRESOLINE Take 1 tablet (100 mg total) by mouth every 8 (eight) hours.   isosorbide mononitrate 30 MG 24 hr tablet Commonly known as: IMDUR  TAKE 1 TABLET (30MG ) ON DIALYSIS DAYS AND 2 TABLETS (60 MG) ON NON DIALYSIS DAYS.   naloxone 4 MG/0.1ML Liqd nasal spray kit Commonly known as: NARCAN 1 spray as directed.   oxyCODONE-acetaminophen 10-325 MG tablet Commonly known as: PERCOCET Take 1 tablet by mouth in the morning and at bedtime.   pantoprazole 40 MG tablet Commonly known as: PROTONIX TAKE 1 TABLET (40 MG TOTAL) BY MOUTH 2 (TWO) TIMES DAILY.   rosuvastatin 10 MG tablet Commonly known as: CRESTOR TAKE 1 TABLET (10 MG TOTAL) BY MOUTH DAILY FOR CHOLESTEROL   senna-docusate 8.6-50 MG tablet Commonly known as: Senokot-S Take 1 tablet by mouth 2 (two) times daily between meals as needed for mild constipation.   sevelamer carbonate 2.4 g Pack Commonly known as: RENVELA Take 2.4 g by mouth 3 (three) times daily with meals.   traZODone 50 MG tablet Commonly known as: DESYREL Take 25-50 mg by mouth at bedtime as needed for sleep.   VITAMIN D-3 PO Take 1 capsule by mouth daily.        Follow-up Information     [facility] Follow up.   Specialty: Rehabilitation Why: Physical and Occupational therapy. Office will call to  arrange follow up after hospital discharge. Contact information: [address] [place] [address] [phone]                 Time coordinating discharge: 39 minutes  Signed:  Harlene Petralia  [facility] 06/28/2023, 8:27 AM

## 2023-06-27 NOTE — Evaluation (Signed)
Occupational Therapy Evaluation Patient Details Name: Monique Henderson MRN: [id number] DOB: [birth year] Today's Date: 06/27/2023   History of Present Illness 73 y.o female presents to [facility] 06/25/23 after being down on the ground for more than two days. MRI negative for CVA. Prior admit 06/03/23 with confusion and respiratory distress after missing HD. PMH includes ESRD (HD TTS), HTN, PAF, polysubstance abuse, aortic aneurysm dissection s/p repair, afib, stroke w/ R sided deficit.   Clinical Impression   PTA, pt reports she was independent with ADL/IADL and functional mobility without AD. She reports she lives at home with her boyfriend who is able to assist as needed. Pt currently requires minA with functional mobility. She reports she will not use a RW/cane after d/c. She currently requires 1 person hand held assistance with mobility during session and was noted to have 1 loss of balance requiring assistance from therapist to correct and pt had decreased safety awareness. Pt currently requires minA for UB/LB ADL and grooming while standing at sink level. She demonstrates decreased activity tolerance, instability, generalized deconditioning impacting her safety and independence with ADL/IADL and functional mobility.  Patient will benefit from continued inpatient follow up therapy, <3 hours/day. Will continue to follow acutely and progress appropriately.        If plan is discharge home, recommend the following: A little help with walking and/or transfers;A little help with bathing/dressing/bathroom;Assistance with cooking/housework    Functional Status Assessment  Patient has had a recent decline in their functional status and demonstrates the ability to make significant improvements in function in a reasonable and predictable amount of time.  Equipment Recommendations  Other (comment) (tbd)    Recommendations for Other Services       Precautions / Restrictions Precautions Precautions:  Fall Precaution Comments: watch HR Restrictions Weight Bearing Restrictions Per Provider Order: No      Mobility Bed Mobility Overal bed mobility: Needs Assistance Bed Mobility: Supine to Sit, Sit to Supine     Supine to sit: Independent Sit to supine: Contact guard assist   General bed mobility comments: assist to guide trunk in correct direction    Transfers Overall transfer level: Needs assistance Equipment used: Rolling walker (2 wheels), 1 person hand held assist Transfers: Sit to/from Stand Sit to Stand: Min assist           General transfer comment: minA for stability in standing, pt with 1x loss of balance requiring therapist assistance to correct      Balance Overall balance assessment: Needs assistance, Mild deficits observed, not formally tested Sitting-balance support: Bilateral upper extremity supported, Feet supported Sitting balance-Leahy Scale: Good     Standing balance support: Single extremity supported, During functional activity Standing balance-Leahy Scale: Poor Standing balance comment: reliant on UE support for stability                           ADL either performed or assessed with clinical judgement   ADL Overall ADL's : Needs assistance/impaired Eating/Feeding: Set up;Sitting Eating/Feeding Details (indicate cue type and reason): setup pt's breakfast atend of session Grooming: Minimal assistance;Standing   Upper Body Bathing: Set up;Sitting   Lower Body Bathing: Minimal assistance;Sit to/from stand   Upper Body Dressing : Sitting;Set up   Lower Body Dressing: Minimal assistance;Sit to/from stand   Toilet Transfer: Minimal assistance;Ambulation Toilet Transfer Details (indicate cue type and reason): 1person hand held assistance with minimal assistance for stability wiht in room ambulation this date Toileting- Clothing  Manipulation and Hygiene: Minimal assistance;Sit to/from stand       Functional mobility during ADLs:  Minimal assistance General ADL Comments: 1person hand held assistance and minA for stability;pt with 1x loss of balance requiring assistance from therapist to correct     Vision Patient Visual Report: No change from baseline       Perception         Praxis         Pertinent Vitals/Pain Pain Assessment Pain Assessment: No/denies pain     Extremity/Trunk Assessment Upper Extremity Assessment Upper Extremity Assessment: Generalized weakness;RUE deficits/detail RUE Deficits / Details: ROM WFLl, grasp limited due to impaired finger extension of digits. Flexes MPs 50* but very little PIP AROM RUE Coordination: decreased fine motor   Lower Extremity Assessment Lower Extremity Assessment: Defer to PT evaluation   Cervical / Trunk Assessment Cervical / Trunk Assessment: Normal   Communication Communication Communication: No apparent difficulties   Cognition Arousal: Alert Behavior During Therapy: Flat affect Overall Cognitive Status: No family/caregiver present to determine baseline cognitive functioning                                 General Comments: pt A&Ox4, decreased safety awareness, did not formally assess     General Comments  [id number] with exertion,    Exercises     Shoulder Instructions      Home Living Family/patient expects to be discharged to:: Private residence Living Arrangements: Spouse/significant other Available Help at Discharge: Family;Available PRN/intermittently Type of Home: Apartment Home Access: Stairs to enter Entrance Stairs-Number of Steps: pt reports flight of stairs Entrance Stairs-Rails: Left Home Layout: One level     Bathroom Shower/Tub: Sponge bathes at baseline;Tub/shower unit;Curtain   Bathroom Toilet: Standard Bathroom Accessibility: Yes How Accessible: Accessible via walker Home Equipment: Rolling Walker (2 wheels);Cane - single point;BSC/3in1   Additional Comments: lift chair broke.      Prior  Functioning/Environment Prior Level of Function : Independent/Modified Independent;Needs assist;History of Falls (last six months)             Mobility Comments: ind no AD, reports two prior falls due to tripping ADLs Comments: Independent in ADL and IADL. Family drives her to appointments/grocery store and provides pillbox set-up for medication management        OT Problem List: Impaired UE functional use;Impaired balance (sitting and/or standing);Decreased activity tolerance;Decreased safety awareness      OT Treatment/Interventions: Self-care/ADL training;Energy conservation;DME and/or AE instruction;Therapeutic activities;Patient/family education;Balance training    OT Goals(Current goals can be found in the care plan section) Acute Rehab OT Goals Patient Stated Goal: to go home OT Goal Formulation: With patient Time For Goal Achievement: 07/11/23 Potential to Achieve Goals: Fair ADL Goals Pt Will Perform Lower Body Dressing: with modified independence;sit to/from stand Pt Will Transfer to Toilet: with modified independence;ambulating Additional ADL Goal #1: Pt will demonstrate independence with 3 fall prevention strategies.  OT Frequency: Min 2X/week    Co-evaluation              AM-PAC OT "6 Clicks" Daily Activity     Outcome Measure Help from another person eating meals?: A Little Help from another person taking care of personal grooming?: A Little Help from another person toileting, which includes using toliet, bedpan, or urinal?: None Help from another person bathing (including washing, rinsing, drying)?: A Little Help from another person to put on and taking off regular upper body  clothing?: A Little Help from another person to put on and taking off regular lower body clothing?: A Little 6 Click Score: 19   End of Session Equipment Utilized During Treatment: Gait belt Nurse Communication: Mobility status  Activity Tolerance: Patient tolerated treatment  well Patient left: in bed;with call bell/phone within reach;with bed alarm set  OT Visit Diagnosis: Other symptoms and signs involving cognitive function;Other abnormalities of gait and mobility (R26.89);History of falling (Z91.81);Muscle weakness (generalized) (M62.81)                Time: 4132-4401 OT Time Calculation (min): 18 min Charges:  OT General Charges $OT Visit: 1 Visit OT Evaluation $OT Eval Low Complexity: 1 Low  Sayuri Rhames OTR/L Acute Rehabilitation Services Office: [phone]   Providence Crosby 06/27/2023, 1:30 PM

## 2023-06-28 ENCOUNTER — Encounter: Payer: Self-pay | Admitting: Physician Assistant

## 2023-06-28 NOTE — Discharge Planning (Signed)
[facility] Patient Discharge Orders- [facility] CLINIC: [place]  Patient's name: SERENE KOPF Admit/DC Dates: 06/25/2023 - 06/27/2023  Discharge Diagnoses: Chronic a.fib with RVR   Hyperkalemia/Mised HD  Aranesp: Given: No   Date and amount of last dose: N/A  Last Hgb: 9.2 PRBC's Given: no Date/# of units: N/A ESA dose for discharge: mircera 225 mcg IV q 2 weeks  IV Iron dose at discharge: none  Heparin change: no  EDW Change: Yes New EDW: 41kg (last weight here 40.3kg)  Bath Change: no  Access intervention/Change: no Details:  Hectorol/Calcitriol change: no  Discharge Labs: Calcium 7.8 Phosphorus 7.9 Albumin 3.0 K+ 4.5  IV Antibiotics: none Details:  On Coumadin?: none Last INR: Next INR: Managed By:   OTHER/APPTS/LAB ORDERS:    D/C Meds to be reconciled by nurse after every discharge.  Completed By: Rogers Blocker, PA-C 06/28/2023, 8:00 AM  [facility] Pager: [phone]    Reviewed by: MD:______ RN_______

## 2023-06-29 ENCOUNTER — Telehealth: Payer: Self-pay | Admitting: Nephrology

## 2023-06-29 NOTE — Telephone Encounter (Signed)
Transition of care contact from inpatient facility  Date of Discharge: 06/27/23 Date of Contact: attempted  1/27 and 06/29/23  Method of contact: Phone  Attempted to contact patient to discuss transition of care from inpatient admission. Patient did not answer the phone.  Voice mail was full  =will have fo at [facility]

## 2023-07-01 NOTE — Progress Notes (Signed)
Cardiology Office Note    Date:  07/02/2023  ID:  CHANIN FRUMKIN, DOB 07-30-1950, MRN [id number] PCP:  Fleet Contras, MD  Cardiologist:  Lance Muss, MD  -> Dr. Odis Hollingshead Electrophysiologist:  None   Chief Complaint: f/u multiple hospitalizations  History of Present Illness: .    Monique Henderson is a 73 y.o. 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    She has vascillated between various groups for cardiology f/u per notes including our office, [facility] and [facility]. Complex PMH as noted above. She has had known afib/flutter for years previously on Coumadin, now on Eliquis. No prior cath on file, but she has had frequent evaluations in the past for chest pain, last with stress test 06/2021 with moderate apical scarring/infarct, no reversible ischemia, EF 41%. This was done by Dr. Odis Hollingshead to evaluate for decline in EF at that time. Her EF has been variable over the years. She was managed medically. She has had numerous admissions and ER visits for various issues including for hypertensive emergency in the setting of noncompliance with HD. There is also a history of cocaine use. MRA at time of stroke 01/2022 showed "no definite hemodynamically significant stenosis in the neck. Possible beading of the right-greater-than-left distal ICAs, concerning for fibromuscular dysplasia. This could be better evaluated with a CTA of the neck if clinically indicated." CTA not pursued at that time by neuro. She has a chronically elevated troponin level. She has had 3 admissions in January 2025 so far. Recent  echo 06/2023 showed EF 45%, apical akinesis, severe LVH, consider cardiac amyloidosis, G2DD, moderately reduced RV function, mildly elevated PASP, moderate LAE, aortic dilation noted. Her troponin was as high as 1476 on 06/02/23, managed medically, felt due to demand ischemia in setting of rapid atrial flutter, ESRD, and reduced EF. With her ESRD and poor compliance with HD, she was not a candidate for cMRI, but with consideration of further workup if she is compliant as outpatient. Ischemic workup was also deferred.  She is here for follow-up today reporting she is doing well without any CP, SOB, syncope. She reports adherence with her HD sessions. She is accompanied by sister Elinor Dodge (wife of Dr. Eldridge Dace patient Malva Limes) who reports family has been helping to ensure she is taking the right medicines and following instructions.   Labwork independently reviewed: 06/2023 Mg OK, K 4.5, Cr 10.59, Hgb 9.2, plt 283, AST ALT OK 05/2022 TSH OK 12/2022 LDL 46, trig 46  ROS: .    Please see the history of present illness.  All other systems are reviewed and otherwise negative.  Studies Reviewed: Marland Kitchen    EKG:  EKG is ordered today, personally reviewed, demonstrating NSR 77bpm, RBBB, no acute STT changes  CV Studies: Cardiac studies reviewed are outlined and summarized above. Otherwise please see EMR for full report.   Current Reported Medications:.    Current Meds  Medication Sig   albuterol (PROVENTIL) (2.5 MG/3ML) 0.083% nebulizer solution Take 2.5 mg by nebulization every 6 (six) hours as needed for wheezing or shortness of breath.   albuterol (VENTOLIN HFA) 108 (90 Base) MCG/ACT inhaler Inhale 2 puffs into the  lungs every 6 (six) hours as needed for wheezing or shortness of breath.   amLODipine (NORVASC) 10 MG tablet Take 1 tablet (10 mg total) by mouth daily.   apixaban (ELIQUIS) 2.5 MG TABS tablet Take 1 tablet (2.5 mg total) by mouth 2 (two) times daily.   carvedilol (COREG) 25 MG tablet  Take 1 tablet (25 mg total) by mouth 2 (two) times daily with a meal.   Cholecalciferol (VITAMIN D-3 PO) Take 1 capsule by mouth daily.   cloNIDine (CATAPRES) 0.1 MG tablet Take 1 tablet (0.1 mg total) by mouth 3 (three) times daily.   hydrALAZINE (APRESOLINE) 100 MG tablet Take 1 tablet (100 mg total) by mouth every 8 (eight) hours.   isosorbide mononitrate (IMDUR) 30 MG 24 hr tablet TAKE 1 TABLET (30MG ) ON DIALYSIS DAYS AND 2 TABLETS (60 MG) ON NON DIALYSIS DAYS.   naloxone (NARCAN) nasal spray 4 mg/0.1 mL 1 spray as directed.   oxyCODONE-acetaminophen (PERCOCET) 10-325 MG tablet Take 1 tablet by mouth in the morning and at bedtime.   pantoprazole (PROTONIX) 40 MG tablet TAKE 1 TABLET (40 MG TOTAL) BY MOUTH 2 (TWO) TIMES DAILY.   rosuvastatin (CRESTOR) 10 MG tablet TAKE 1 TABLET (10 MG TOTAL) BY MOUTH DAILY FOR CHOLESTEROL   senna-docusate (SENOKOT-S) 8.6-50 MG tablet Take 1 tablet by mouth 2 (two) times daily between meals as needed for mild constipation.   sevelamer carbonate (RENVELA) 2.4 g PACK Take 2.4 g by mouth 3 (three) times daily with meals.   traZODone (DESYREL) 50 MG tablet Take 25-50 mg by mouth at bedtime as needed for sleep.    Physical Exam:    VS:  BP 104/64   Pulse 77   Ht 5\' 3"  (1.6 m)   Wt 98 lb 12.8 oz (44.8 kg)   SpO2 97%   BMI 17.50 kg/m    Wt Readings from Last 3 Encounters:  07/02/23 98 lb 12.8 oz (44.8 kg)  06/27/23 88 lb 14.4 oz (40.3 kg)  06/19/23 90 lb 6.2 oz (41 kg)    GEN: Well nourished, well developed in no acute distress NECK: No JVD; No carotid bruits CARDIAC: RRR, no murmurs, rubs, gallops RESPIRATORY:  Clear to auscultation without rales, wheezing or rhonchi  ABDOMEN: Soft, non-tender, non-distended EXTREMITIES:  No edema; No acute deformity   Asessement and Plan:.    1. Paroxysmal atrial fib/flutter - she is back in NSR and feels clinically stable. Will continue carvedilol and Eliquis at present doses.   2. Chronic HFmrEF, elevated  troponin - appears euvolemic, volume managed by HD. Cardiomyopathy has been felt to be a mixed picture. Ischemic workup previously deferred due to concerns of noncompliance. Given her comorbidities and lack of anginal symptoms, continued medical therapy is appropriate. SBP is on the low end of normal but BP traditionally followed in dialysis and was hypertensive in the hospital. Will defer BP management to nephrology. Can back off on amlodipine in the future if needed. There was question of cardiac amyloidosis in the hospital recently. cMRI not ideal given ESRD. Will proceed with PYP scan. Also encouraged continued compliance. Family is helping. Inessa wants [name] to be her primary telephone contact in Epic, I changed her number to the one with the star next to it.  3. Carotid artery disease - 40-59 BICA in 2020 but no significant obstruction on MRA 01/2022. Lipids managed by PCP.  5. H/o Type A aortic dissection - imaging 12/2022 showed "unchanged contour and caliber of the thoracic aorta status  post graft repair of the ascending thoracic aorta. No evidence of aneurysm, dissection, or other acute aortic pathology." Can review timing of next imaging at next follow-up.     Disposition: F/u with Dr. Odis Hollingshead in 3 months upon transition from Dr. Eldridge Dace - Dr. Odis Hollingshead followed her in the hospital.  Signed, Laurann Montana, PA-C

## 2023-07-02 ENCOUNTER — Encounter: Payer: Self-pay | Admitting: Physician Assistant

## 2023-07-02 ENCOUNTER — Ambulatory Visit: Payer: Medicare Other | Attending: Physician Assistant | Admitting: Physician Assistant

## 2023-07-02 VITALS — BP 104/64 | HR 77 | Ht 63.0 in | Wt 98.8 lb

## 2023-07-02 DIAGNOSIS — I779 Disorder of arteries and arterioles, unspecified: Secondary | ICD-10-CM | POA: Diagnosis not present

## 2023-07-02 DIAGNOSIS — I4892 Unspecified atrial flutter: Secondary | ICD-10-CM | POA: Diagnosis not present

## 2023-07-02 DIAGNOSIS — I5022 Chronic systolic (congestive) heart failure: Secondary | ICD-10-CM

## 2023-07-02 DIAGNOSIS — R9431 Abnormal electrocardiogram [ECG] [EKG]: Secondary | ICD-10-CM

## 2023-07-02 DIAGNOSIS — Z8679 Personal history of other diseases of the circulatory system: Secondary | ICD-10-CM

## 2023-07-02 DIAGNOSIS — R7989 Other specified abnormal findings of blood chemistry: Secondary | ICD-10-CM

## 2023-07-02 DIAGNOSIS — I517 Cardiomegaly: Secondary | ICD-10-CM

## 2023-07-02 DIAGNOSIS — I480 Paroxysmal atrial fibrillation: Secondary | ICD-10-CM | POA: Diagnosis not present

## 2023-07-02 NOTE — Patient Instructions (Addendum)
Medication Instructions:  Your physician recommends that you continue on your current medications as directed. Please refer to the Current Medication list given to you today.  *If you need a refill on your cardiac medications before your next appointment, please call your pharmacy*   Lab Work: None ordered  If you have labs (blood work) drawn today and your tests are completely normal, you will receive your results only by: MyChart Message (if you have MyChart) OR A paper copy in the mail If you have any lab test that is abnormal or we need to change your treatment, we will call you to review the results.   Testing/Procedures: Your physician recommends you have a PYP Scan.   Follow-Up: At [facility], you and your health needs are our priority.  As part of our continuing mission to provide you with exceptional heart care, we have created designated Provider Care Teams.  These Care Teams include your primary Cardiologist (physician) and Advanced Practice Providers (APPs -  Physician Assistants and Nurse Practitioners) who all work together to provide you with the care you need, when you need it.  We recommend signing up for the patient portal called "MyChart".  Sign up information is provided on this After Visit Summary.  MyChart is used to connect with patients for Virtual Visits (Telemedicine).  Patients are able to view lab/test results, encounter notes, upcoming appointments, etc.  Non-urgent messages can be sent to your provider as well.   To learn more about what you can do with MyChart, go to [email or web address].    Your next appointment:   3 month(s)  Provider:   Tessa Lerner, DO     Other Instructions   1st Floor: - Lobby - Registration  - Pharmacy  - Lab - Cafe  2nd Floor: - PV Lab - Diagnostic Testing (echo, CT, nuclear med)  3rd Floor: - Vacant  4th Floor: - TCTS (cardiothoracic surgery) - AFib Clinic - Structural Heart Clinic - Vascular  Surgery  - Vascular Ultrasound  5th Floor: - [facility] (general and EP) - Clinical Pharmacy for coumadin, hypertension, lipid, weight-loss medications, and med management appointments    Valet parking services will be available as well.

## 2023-07-14 ENCOUNTER — Telehealth (HOSPITAL_COMMUNITY): Payer: Self-pay | Admitting: *Deleted

## 2023-07-14 NOTE — Telephone Encounter (Signed)
Reminder call given for Amyloid study on 07/19/23 at 12:30

## 2023-07-19 ENCOUNTER — Ambulatory Visit (HOSPITAL_BASED_OUTPATIENT_CLINIC_OR_DEPARTMENT_OTHER): Payer: Medicare Other

## 2023-07-19 DIAGNOSIS — R9431 Abnormal electrocardiogram [ECG] [EKG]: Secondary | ICD-10-CM | POA: Insufficient documentation

## 2023-07-19 DIAGNOSIS — I517 Cardiomegaly: Secondary | ICD-10-CM | POA: Insufficient documentation

## 2023-07-19 DIAGNOSIS — E875 Hyperkalemia: Secondary | ICD-10-CM | POA: Diagnosis not present

## 2023-07-19 DIAGNOSIS — T80211A Bloodstream infection due to central venous catheter, initial encounter: Secondary | ICD-10-CM | POA: Diagnosis not present

## 2023-07-19 LAB — MYOCARDIAL AMYLOID PLANAR & SPECT: H/CL Ratio: 1.1

## 2023-07-19 MED ORDER — TECHNETIUM TC 99M PYROPHOSPHATE
21.7000 | Freq: Once | INTRAVENOUS | Status: AC
Start: 2023-07-19 — End: 2023-07-19
  Administered 2023-07-19: 21.7 via INTRAVENOUS

## 2023-07-22 ENCOUNTER — Encounter (HOSPITAL_COMMUNITY): Payer: Self-pay | Admitting: Emergency Medicine

## 2023-07-22 ENCOUNTER — Other Ambulatory Visit: Payer: Self-pay

## 2023-07-22 ENCOUNTER — Emergency Department (HOSPITAL_COMMUNITY): Payer: Medicare Other

## 2023-07-22 ENCOUNTER — Inpatient Hospital Stay (HOSPITAL_COMMUNITY)
Admission: EM | Admit: 2023-07-22 | Discharge: 2023-07-25 | DRG: 314 | Disposition: A | Discharge status: Home / Self Care | Payer: Medicare Other | Attending: Student | Admitting: Student

## 2023-07-22 DIAGNOSIS — Z888 Allergy status to other drugs, medicaments and biological substances status: Secondary | ICD-10-CM

## 2023-07-22 DIAGNOSIS — I2510 Atherosclerotic heart disease of native coronary artery without angina pectoris: Secondary | ICD-10-CM | POA: Diagnosis present

## 2023-07-22 DIAGNOSIS — I69322 Dysarthria following cerebral infarction: Secondary | ICD-10-CM | POA: Diagnosis not present

## 2023-07-22 DIAGNOSIS — Z853 Personal history of malignant neoplasm of breast: Secondary | ICD-10-CM

## 2023-07-22 DIAGNOSIS — E8809 Other disorders of plasma-protein metabolism, not elsewhere classified: Secondary | ICD-10-CM | POA: Diagnosis present

## 2023-07-22 DIAGNOSIS — I10 Essential (primary) hypertension: Secondary | ICD-10-CM | POA: Diagnosis present

## 2023-07-22 DIAGNOSIS — Y828 Other medical devices associated with adverse incidents: Secondary | ICD-10-CM | POA: Diagnosis present

## 2023-07-22 DIAGNOSIS — Z8249 Family history of ischemic heart disease and other diseases of the circulatory system: Secondary | ICD-10-CM

## 2023-07-22 DIAGNOSIS — L03313 Cellulitis of chest wall: Secondary | ICD-10-CM | POA: Diagnosis present

## 2023-07-22 DIAGNOSIS — Z883 Allergy status to other anti-infective agents status: Secondary | ICD-10-CM

## 2023-07-22 DIAGNOSIS — E785 Hyperlipidemia, unspecified: Secondary | ICD-10-CM | POA: Diagnosis present

## 2023-07-22 DIAGNOSIS — I700 Atherosclerosis of aorta: Secondary | ICD-10-CM | POA: Diagnosis present

## 2023-07-22 DIAGNOSIS — Z66 Do not resuscitate: Secondary | ICD-10-CM | POA: Diagnosis present

## 2023-07-22 DIAGNOSIS — L0390 Cellulitis, unspecified: Secondary | ICD-10-CM | POA: Insufficient documentation

## 2023-07-22 DIAGNOSIS — E872 Acidosis, unspecified: Secondary | ICD-10-CM | POA: Diagnosis present

## 2023-07-22 DIAGNOSIS — I5042 Chronic combined systolic (congestive) and diastolic (congestive) heart failure: Secondary | ICD-10-CM | POA: Diagnosis present

## 2023-07-22 DIAGNOSIS — Z91013 Allergy to seafood: Secondary | ICD-10-CM

## 2023-07-22 DIAGNOSIS — K219 Gastro-esophageal reflux disease without esophagitis: Secondary | ICD-10-CM | POA: Diagnosis present

## 2023-07-22 DIAGNOSIS — S21111A Laceration without foreign body of right front wall of thorax without penetration into thoracic cavity, initial encounter: Secondary | ICD-10-CM | POA: Diagnosis present

## 2023-07-22 DIAGNOSIS — G8929 Other chronic pain: Secondary | ICD-10-CM | POA: Diagnosis present

## 2023-07-22 DIAGNOSIS — Z992 Dependence on renal dialysis: Secondary | ICD-10-CM | POA: Diagnosis not present

## 2023-07-22 DIAGNOSIS — Z8709 Personal history of other diseases of the respiratory system: Secondary | ICD-10-CM

## 2023-07-22 DIAGNOSIS — T80211A Bloodstream infection due to central venous catheter, initial encounter: Secondary | ICD-10-CM | POA: Diagnosis present

## 2023-07-22 DIAGNOSIS — Z91158 Patient's noncompliance with renal dialysis for other reason: Secondary | ICD-10-CM

## 2023-07-22 DIAGNOSIS — Z86000 Personal history of in-situ neoplasm of breast: Secondary | ICD-10-CM

## 2023-07-22 DIAGNOSIS — I5082 Biventricular heart failure: Secondary | ICD-10-CM | POA: Diagnosis present

## 2023-07-22 DIAGNOSIS — Z9889 Other specified postprocedural states: Secondary | ICD-10-CM

## 2023-07-22 DIAGNOSIS — W19XXXA Unspecified fall, initial encounter: Secondary | ICD-10-CM | POA: Diagnosis present

## 2023-07-22 DIAGNOSIS — E782 Mixed hyperlipidemia: Secondary | ICD-10-CM | POA: Diagnosis not present

## 2023-07-22 DIAGNOSIS — I471 Supraventricular tachycardia, unspecified: Secondary | ICD-10-CM | POA: Diagnosis present

## 2023-07-22 DIAGNOSIS — Z820 Family history of epilepsy and other diseases of the nervous system: Secondary | ICD-10-CM

## 2023-07-22 DIAGNOSIS — Z981 Arthrodesis status: Secondary | ICD-10-CM

## 2023-07-22 DIAGNOSIS — Z91040 Latex allergy status: Secondary | ICD-10-CM

## 2023-07-22 DIAGNOSIS — E875 Hyperkalemia: Principal | ICD-10-CM

## 2023-07-22 DIAGNOSIS — Z8679 Personal history of other diseases of the circulatory system: Secondary | ICD-10-CM

## 2023-07-22 DIAGNOSIS — Y848 Other medical procedures as the cause of abnormal reaction of the patient, or of later complication, without mention of misadventure at the time of the procedure: Secondary | ICD-10-CM | POA: Diagnosis present

## 2023-07-22 DIAGNOSIS — N189 Chronic kidney disease, unspecified: Secondary | ICD-10-CM

## 2023-07-22 DIAGNOSIS — I132 Hypertensive heart and chronic kidney disease with heart failure and with stage 5 chronic kidney disease, or end stage renal disease: Secondary | ICD-10-CM | POA: Diagnosis present

## 2023-07-22 DIAGNOSIS — B9562 Methicillin resistant Staphylococcus aureus infection as the cause of diseases classified elsewhere: Secondary | ICD-10-CM | POA: Diagnosis present

## 2023-07-22 DIAGNOSIS — Z841 Family history of disorders of kidney and ureter: Secondary | ICD-10-CM

## 2023-07-22 DIAGNOSIS — Z9012 Acquired absence of left breast and nipple: Secondary | ICD-10-CM

## 2023-07-22 DIAGNOSIS — Z800 Family history of malignant neoplasm of digestive organs: Secondary | ICD-10-CM

## 2023-07-22 DIAGNOSIS — T829XXA Unspecified complication of cardiac and vascular prosthetic device, implant and graft, initial encounter: Secondary | ICD-10-CM | POA: Diagnosis present

## 2023-07-22 DIAGNOSIS — N2581 Secondary hyperparathyroidism of renal origin: Secondary | ICD-10-CM | POA: Diagnosis present

## 2023-07-22 DIAGNOSIS — J449 Chronic obstructive pulmonary disease, unspecified: Secondary | ICD-10-CM | POA: Diagnosis present

## 2023-07-22 DIAGNOSIS — Z862 Personal history of diseases of the blood and blood-forming organs and certain disorders involving the immune mechanism: Secondary | ICD-10-CM

## 2023-07-22 DIAGNOSIS — Z79899 Other long term (current) drug therapy: Secondary | ICD-10-CM

## 2023-07-22 DIAGNOSIS — N186 End stage renal disease: Secondary | ICD-10-CM | POA: Diagnosis present

## 2023-07-22 DIAGNOSIS — Z886 Allergy status to analgesic agent status: Secondary | ICD-10-CM

## 2023-07-22 DIAGNOSIS — Z7901 Long term (current) use of anticoagulants: Secondary | ICD-10-CM

## 2023-07-22 DIAGNOSIS — Z8619 Personal history of other infectious and parasitic diseases: Secondary | ICD-10-CM

## 2023-07-22 DIAGNOSIS — D631 Anemia in chronic kidney disease: Secondary | ICD-10-CM | POA: Diagnosis present

## 2023-07-22 DIAGNOSIS — Z86718 Personal history of other venous thrombosis and embolism: Secondary | ICD-10-CM

## 2023-07-22 DIAGNOSIS — Z808 Family history of malignant neoplasm of other organs or systems: Secondary | ICD-10-CM

## 2023-07-22 DIAGNOSIS — L03818 Cellulitis of other sites: Secondary | ICD-10-CM | POA: Diagnosis not present

## 2023-07-22 DIAGNOSIS — Z885 Allergy status to narcotic agent status: Secondary | ICD-10-CM

## 2023-07-22 DIAGNOSIS — Z833 Family history of diabetes mellitus: Secondary | ICD-10-CM

## 2023-07-22 DIAGNOSIS — I480 Paroxysmal atrial fibrillation: Secondary | ICD-10-CM | POA: Diagnosis present

## 2023-07-22 DIAGNOSIS — Z87442 Personal history of urinary calculi: Secondary | ICD-10-CM

## 2023-07-22 DIAGNOSIS — Z87891 Personal history of nicotine dependence: Secondary | ICD-10-CM

## 2023-07-22 LAB — CBC WITH DIFFERENTIAL/PLATELET
Abs Immature Granulocytes: 0.01 10*3/uL (ref 0.00–0.07)
Basophils Absolute: 0 10*3/uL (ref 0.0–0.1)
Basophils Relative: 1 %
Eosinophils Absolute: 0.3 10*3/uL (ref 0.0–0.5)
Eosinophils Relative: 6 %
HCT: 30.3 % — ABNORMAL LOW (ref 36.0–46.0)
Hemoglobin: 9.5 g/dL — ABNORMAL LOW (ref 12.0–15.0)
Immature Granulocytes: 0 %
Lymphocytes Relative: 14 %
Lymphs Abs: 0.8 10*3/uL (ref 0.7–4.0)
MCH: 29.7 pg (ref 26.0–34.0)
MCHC: 31.4 g/dL (ref 30.0–36.0)
MCV: 94.7 fL (ref 80.0–100.0)
Monocytes Absolute: 0.8 10*3/uL (ref 0.1–1.0)
Monocytes Relative: 13 %
Neutro Abs: 4 10*3/uL (ref 1.7–7.7)
Neutrophils Relative %: 66 %
Platelets: 205 10*3/uL (ref 150–400)
RBC: 3.2 MIL/uL — ABNORMAL LOW (ref 3.87–5.11)
RDW: 19.5 % — ABNORMAL HIGH (ref 11.5–15.5)
WBC: 6 10*3/uL (ref 4.0–10.5)
nRBC: 0 % (ref 0.0–0.2)

## 2023-07-22 LAB — COMPREHENSIVE METABOLIC PANEL
ALT: 9 U/L (ref 0–44)
AST: 16 U/L (ref 15–41)
Albumin: 3.5 g/dL (ref 3.5–5.0)
Alkaline Phosphatase: 62 U/L (ref 38–126)
Anion gap: 16 — ABNORMAL HIGH (ref 5–15)
BUN: 74 mg/dL — ABNORMAL HIGH (ref 8–23)
CO2: 25 mmol/L (ref 22–32)
Calcium: 9 mg/dL (ref 8.9–10.3)
Chloride: 99 mmol/L (ref 98–111)
Creatinine, Ser: 14.08 mg/dL — ABNORMAL HIGH (ref 0.44–1.00)
GFR, Estimated: 3 mL/min — ABNORMAL LOW (ref >60)
Glucose, Bld: 103 mg/dL — ABNORMAL HIGH (ref 70–99)
Potassium: >7.5 mmol/L (ref 3.5–5.1)
Sodium: 140 mmol/L (ref 135–145)
Total Bilirubin: 0.6 mg/dL (ref 0.0–1.2)
Total Protein: 7 g/dL (ref 6.5–8.1)

## 2023-07-22 LAB — MAGNESIUM: Magnesium: 2.7 mg/dL — ABNORMAL HIGH (ref 1.7–2.4)

## 2023-07-22 LAB — HEPATITIS B SURFACE ANTIGEN: Hepatitis B Surface Ag: NONREACTIVE

## 2023-07-22 MED ORDER — HYDRALAZINE HCL 50 MG PO TABS
100.0000 mg | ORAL_TABLET | Freq: Three times a day (TID) | ORAL | Status: DC
  Filled 2023-07-22: qty 2

## 2023-07-22 MED ORDER — ALBUTEROL SULFATE (2.5 MG/3ML) 0.083% IN NEBU
2.5000 mg | INHALATION_SOLUTION | Freq: Once | RESPIRATORY_TRACT | Status: AC
  Administered 2023-07-22: 2.5 mg via RESPIRATORY_TRACT
  Filled 2023-07-22: qty 3

## 2023-07-22 MED ORDER — CLONIDINE HCL 0.1 MG PO TABS
0.1000 mg | ORAL_TABLET | Freq: Three times a day (TID) | ORAL | Status: DC
Start: 2023-07-23 — End: 2023-07-25
  Administered 2023-07-23 – 2023-07-25 (×7): 0.1 mg via ORAL
  Filled 2023-07-22 (×7): qty 1

## 2023-07-22 MED ORDER — APIXABAN 2.5 MG PO TABS
2.5000 mg | ORAL_TABLET | Freq: Two times a day (BID) | ORAL | Status: DC
  Filled 2023-07-22: qty 1

## 2023-07-22 MED ORDER — AMLODIPINE BESYLATE 10 MG PO TABS
10.0000 mg | ORAL_TABLET | Freq: Every day | ORAL | Status: DC
  Administered 2023-07-23 – 2023-07-25 (×3): 10 mg via ORAL
  Filled 2023-07-22 (×3): qty 1

## 2023-07-22 MED ORDER — VANCOMYCIN HCL IN DEXTROSE 1-5 GM/200ML-% IV SOLN
1000.0000 mg | Freq: Once | INTRAVENOUS | Status: AC
  Administered 2023-07-23: 1000 mg via INTRAVENOUS
  Filled 2023-07-22: qty 200

## 2023-07-22 MED ORDER — PENTAFLUOROPROP-TETRAFLUOROETH EX AERO: 1.0000 | INHALATION_SPRAY | CUTANEOUS | Status: DC | PRN

## 2023-07-22 MED ORDER — HEPARIN SODIUM (PORCINE) 1000 UNIT/ML DIALYSIS
1000.0000 [IU] | INTRAMUSCULAR | Status: DC | PRN
  Administered 2023-07-23: 1000 [IU]
  Filled 2023-07-22: qty 1

## 2023-07-22 MED ORDER — SODIUM BICARBONATE 8.4 % IV SOLN
50.0000 meq | Freq: Once | INTRAVENOUS | Status: AC
  Administered 2023-07-22: 50 meq via INTRAVENOUS
  Filled 2023-07-22: qty 50

## 2023-07-22 MED ORDER — LIDOCAINE-PRILOCAINE 2.5-2.5 % EX CREA: 1.0000 | TOPICAL_CREAM | CUTANEOUS | Status: DC | PRN

## 2023-07-22 MED ORDER — INSULIN ASPART 100 UNIT/ML IJ SOLN: 5.0000 [IU] | Freq: Once | INTRAMUSCULAR | Status: DC

## 2023-07-22 MED ORDER — MELATONIN 3 MG PO TABS: 3.0000 mg | ORAL_TABLET | Freq: Every evening | ORAL | Status: DC | PRN

## 2023-07-22 MED ORDER — DEXTROSE 50 % IV SOLN
25.0000 g | Freq: Once | INTRAVENOUS | Status: DC
  Filled 2023-07-22: qty 50

## 2023-07-22 MED ORDER — ROSUVASTATIN CALCIUM 5 MG PO TABS
10.0000 mg | ORAL_TABLET | Freq: Every day | ORAL | Status: DC
  Administered 2023-07-23 – 2023-07-25 (×3): 10 mg via ORAL
  Filled 2023-07-22 (×3): qty 2

## 2023-07-22 MED ORDER — SODIUM ZIRCONIUM CYCLOSILICATE 10 G PO PACK: 10.0000 g | PACK | Freq: Once | ORAL | Status: DC

## 2023-07-22 MED ORDER — ACETAMINOPHEN 650 MG RE SUPP: 650.0000 mg | Freq: Four times a day (QID) | RECTAL | Status: DC | PRN

## 2023-07-22 MED ORDER — ALBUTEROL SULFATE (2.5 MG/3ML) 0.083% IN NEBU: 2.5000 mg | INHALATION_SOLUTION | Freq: Four times a day (QID) | RESPIRATORY_TRACT | Status: DC | PRN

## 2023-07-22 MED ORDER — PANTOPRAZOLE SODIUM 40 MG PO TBEC: 40.0000 mg | DELAYED_RELEASE_TABLET | Freq: Two times a day (BID) | ORAL | Status: DC

## 2023-07-22 MED ORDER — SEVELAMER CARBONATE 2.4 G PO PACK
2.4000 g | PACK | Freq: Three times a day (TID) | ORAL | Status: DC
  Administered 2023-07-23 – 2023-07-25 (×6): 2.4 g via ORAL
  Filled 2023-07-22 (×10): qty 1

## 2023-07-22 MED ORDER — ONDANSETRON HCL 4 MG/2ML IJ SOLN: 4.0000 mg | Freq: Four times a day (QID) | INTRAMUSCULAR | Status: DC | PRN

## 2023-07-22 MED ORDER — CALCIUM GLUCONATE-NACL 2-0.675 GM/100ML-% IV SOLN
2.0000 g | Freq: Once | INTRAVENOUS | Status: AC
  Administered 2023-07-22: 2000 mg via INTRAVENOUS
  Filled 2023-07-22: qty 100

## 2023-07-22 MED ORDER — DARBEPOETIN ALFA 150 MCG/0.3ML IJ SOSY
150.0000 ug | PREFILLED_SYRINGE | INTRAMUSCULAR | Status: DC
  Administered 2023-07-23: 150 ug via SUBCUTANEOUS
  Filled 2023-07-22: qty 0.3

## 2023-07-22 MED ORDER — INSULIN ASPART 100 UNIT/ML IJ SOLN: 10.0000 [IU] | Freq: Once | INTRAMUSCULAR | Status: DC

## 2023-07-22 MED ORDER — FENTANYL CITRATE PF 50 MCG/ML IJ SOSY
50.0000 ug | PREFILLED_SYRINGE | Freq: Once | INTRAMUSCULAR | Status: AC
  Administered 2023-07-22: 50 ug via INTRAVENOUS
  Filled 2023-07-22: qty 1

## 2023-07-22 MED ORDER — LIDOCAINE HCL (PF) 1 % IJ SOLN: 5.0000 mL | INTRAMUSCULAR | Status: DC | PRN

## 2023-07-22 MED ORDER — VANCOMYCIN HCL 500 MG/100ML IV SOLN
500.0000 mg | INTRAVENOUS | Status: DC
  Administered 2023-07-24: 500 mg via INTRAVENOUS
  Filled 2023-07-22 (×2): qty 100

## 2023-07-22 MED ORDER — ALTEPLASE 2 MG IJ SOLR: 2.0000 mg | Freq: Once | INTRAMUSCULAR | Status: DC | PRN

## 2023-07-22 MED ORDER — ANTICOAGULANT SODIUM CITRATE 4% (200MG/5ML) IV SOLN: 5.0000 mL | Status: DC | PRN

## 2023-07-22 MED ORDER — SODIUM ZIRCONIUM CYCLOSILICATE 5 G PO PACK
5.0000 g | PACK | Freq: Three times a day (TID) | ORAL | Status: DC
  Filled 2023-07-22: qty 1

## 2023-07-22 MED ORDER — ACETAMINOPHEN 325 MG PO TABS: 650.0000 mg | ORAL_TABLET | Freq: Four times a day (QID) | ORAL | Status: DC | PRN

## 2023-07-22 NOTE — Progress Notes (Signed)
Pharmacy Antibiotic Note  Monique Henderson is a 73 y.o. female admitted on 07/22/2023 with  concern for infected tunneled HD catheter .  Pharmacy has been consulted for vancomycin dosing.  WBC 6.0, afebrile, noted purulent drainage from dialysis catheter. Last HD session was on Saturday 2/15. Unable to get dialysis today due to concern for infection. Generally TTS dialysis schedule outpatient. Pt weight 44.5 kg in 06/2023. Current plan for emergent HD tonight.    Plan: Vancomycin 1000 mg IV x1 load Give supplemental vancomycin 500 mg IV doses after each HD session Monitor clinical status, HD schedule, and culture data     Temp (24hrs), Avg:97.8 F (36.6 C), Min:97.7 F (36.5 C), Max:97.8 F (36.6 C)  Recent Labs  Lab 07/22/23 1553  WBC 6.0  CREATININE 14.08*    CrCl cannot be calculated (Unknown ideal weight.).    Allergies  Allergen Reactions   Shrimp [Shellfish Allergy] Shortness Of Breath   Bactroban [Mupirocin] Other (See Comments)    "Sores in nose"   Chlorhexidine Gluconate Itching   Hydrocodone Itching, Nausea And Vomiting and Nausea Only   Latex Itching    Latex gloves cause itchiness and a rash   Tylenol [Acetaminophen] Itching    Takes Percocet at home 05/06/23   Zestril [Lisinopril] Cough    Antimicrobials this admission: Vancomycin 2/20 >>    Microbiology results: 2/20 BCx: sent   Thank you for allowing pharmacy to be a part of this patient's care.  Burnett Kanaris 07/22/2023 9:17 PM

## 2023-07-22 NOTE — ED Notes (Signed)
Pt was stuck twice for 2nd set of BC's. Wasn't successful.

## 2023-07-22 NOTE — ED Triage Notes (Signed)
Pt BIBA from home w/ c/o infected dialysis port. Pt arrived at dialysis and staff there were unable to use port d/t puss around insertion site, redness, and inflammation. Pt is A&O x4. Port was placed x4 weeks ago. Hx: Previous stroke. Denies fevers

## 2023-07-22 NOTE — ED Notes (Signed)
 Patient transported to Dialysis

## 2023-07-22 NOTE — ED Provider Notes (Signed)
[place] EMERGENCY DEPARTMENT AT [facility] Provider Note  Arrival date/time:07/22/2023 8:05 PM  HPI/ROS   Monique Henderson is a 73 y.o. female with PMH significant for type A aortic dissection s/p repair 04/2019, CVA, PAF/flutter on Elqiuis, HFrEF, ESRD on HD TThS, COPD, HTN, anemia, prior breast CA, who presents for infected port.  History is provided by patient.  Patient presents from dialysis due to concern for purulent drainage from her permacath site. She was unable to get dialysis today due to concern for infection. The dialysis team expressed a large amount of puss from the permacath site. She denies any systemic symptoms such as fevers, chills, cough or congestion.  She does endorse some shortness of breath, which is normal for her on days of dialysis.  A complete ROS was performed with pertinent positives/negatives noted above.   ED Course and Medical Decision Making   I personally reviewed the patient's vitals.  Assessment/Plan: This is a patient with history of ESRD on dialysis Tuesday Thursday Saturday who is presenting with concern for infection of her permacath  Patient had permacath placed on 05/28/2023 by Paulene Floor, MD.  Patient has also not received dialysis since last Saturday, 5 days ago. Patient is asymptomatic at this time.  Lab workup shows concern for hyperkalemia to greater than 7.5 on CMP as well as elevated creatinine to 14.08 in the setting of ESRD.  Magnesium was elevated to 2.7. CBC shows no leukocytosis and hemoglobin is 9.5, which is baseline for patient in the setting of ESRD.  Patient was given potassium shifting medications with sodium bicarb, albuterol, insulin/dextrose, Lokelma, and calcium gluconate for cardioprotection.  I reached out to nephrology and got in touch with Dr. Malen Gauze.  She will plan to dialyze patient tonight using her current permacath. She will also continue to monitor patient's permacath during hospitalization to  assess for possible need to exchange.  Plan to admit patient to hospital service for further management.  Disposition:  I discussed the case with hospitalist who graciously agreed to admit the patient to their service for continued care.   Clinical Impression:  1. Hyperkalemia   2. Complication of dialysis access insertion, initial encounter     Rx / DC Orders ED Discharge Orders     None       The plan for this patient was discussed with Dr. Denton Lank , who voiced agreement and who oversaw evaluation and treatment of this patient.   Clinical Complexity A medically appropriate history, review of systems, and physical exam was performed.  Patient's presentation is most consistent with acute presentation with potential threat to life or bodily function.  Medical Decision Making Amount and/or Complexity of Data Reviewed Labs: ordered. Radiology: ordered.  Risk Prescription drug management. Decision regarding hospitalization.    Physical Exam and Medical History   Vitals:   07/22/23 1456 07/22/23 1530 07/22/23 1851  BP:  (!) 164/96 (!) 179/98  Pulse:  78 81  Resp:  18 17  Temp:  97.8 F (36.6 C) 97.7 F (36.5 C)  TempSrc:  Oral Oral  SpO2: 99% 99% 99%    Physical Exam Vitals and nursing note reviewed.  Constitutional:      General: She is not in acute distress.    Appearance: She is well-developed.  HENT:     Head: Normocephalic and atraumatic.  Eyes:     Conjunctiva/sclera: Conjunctivae normal.  Cardiovascular:     Rate and Rhythm: Normal rate.  Pulmonary:     Effort: Pulmonary  effort is normal. No respiratory distress.     Breath sounds: Normal breath sounds.  Chest:     Comments: Erythema and tenderness around permacath site right upper chest Abdominal:     Palpations: Abdomen is soft.     Tenderness: There is no abdominal tenderness.  Musculoskeletal:        General: No swelling.     Cervical back: Neck supple.     Right lower leg: No edema.      Left lower leg: No edema.  Skin:    General: Skin is warm and dry.     Capillary Refill: Capillary refill takes less than 2 seconds.  Neurological:     Mental Status: She is alert.  Psychiatric:        Mood and Affect: Mood normal.     Medical History: Allergies  Allergen Reactions   Shrimp [Shellfish Allergy] Shortness Of Breath   Bactroban [Mupirocin] Other (See Comments)    "Sores in nose"   Hydrocodone Itching, Nausea Only and Nausea And Vomiting   Latex Itching    Latex gloves cause itchiness and a rash   Chlorhexidine Gluconate Itching   Tylenol [Acetaminophen] Itching    Takes Percocet at home 05/06/23   Zestril [Lisinopril] Cough   Past Medical History:  Diagnosis Date   Acute cardioembolic stroke (HCC) 02/26/2022   AF (paroxysmal atrial fibrillation) (HCC) 05/29/2019   on Coumadin   Aortic atherosclerosis (HCC) 07/05/2019   Aortic dissection (HCC) 04/04/2019   s/p repair   Bone spur 2008   Right calcaneal foot spur   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 Chevis Pretty; S/P re-excision of superior and inferior margins 10/27/2012.    Dissection of aorta (HCC) 04/03/2019   ESRD on hemodialysis (HCC) 05/29/2019   Essential hypertension 09/16/2006   GERD 09/16/2006   Hepatitis C    treated and RNA confirmed not detectable 01/2017   Insomnia 03/14/2015   Malnutrition of moderate degree 05/19/2019   Non compliance with medical treatment 12/04/2017   Normocytic anemia    With  thrombocytosis   Osteoarthritis    Right ureteral stone 2002   S/P lumbar spinal fusion 01/18/2014   S/P lumbar decompressive laminectomy, fusion, and plating for lumbar spinal stenosis on 05/27/2009 by Dr. Tia Alert.  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 Tia Alert.     Stroke (cerebrum) (HCC)    Stroke (HCC) 02/23/2022   SVT (supraventricular tachycardia) (HCC) 08/28/2019   Tobacco use disorder 04/19/2009   Uterine fibroid    Wears dentures    top    Past Surgical History:  Procedure Laterality Date   ANTERIOR LAT LUMBAR FUSION N/A 01/18/2014   Procedure: ANTERIOR LATERAL LUMBAR FUSION LUMBAR TWO-THREE;  Surgeon: Tia Alert, MD;  Location: [facility];  Service: Neurosurgery;  Laterality: N/A;   ANTERIOR LUMBAR FUSION  01/18/2014   AV FISTULA PLACEMENT Left 04/20/2019   Procedure: ARTERIOVENOUS (AV) FISTULA CREATION;  Surgeon: Maeola Harman, MD;  [facility] OR;  Service: Vascular;  Laterality: Left;   BACK SURGERY     BREAST LUMPECTOMY Left 01/2003   BREAST LUMPECTOMY Right 2014   BREAST LUMPECTOMY WITH NEEDLE LOCALIZATION AND AXILLARY SENTINEL LYMPH NODE BX Right 10/13/2012   Procedure: BREAST LUMPECTOMY WITH NEEDLE LOCALIZATION;  Surgeon: Robyne Askew, MD;  Location: [facility];  Service: General;  Laterality: Right;  Right breast wire localized lumpectomy   DIALYSIS/PERMA CATHETER INSERTION N/A 05/28/2023   Procedure: DIALYSIS/PERMA CATHETER INSERTION;  Surgeon: Ethelene Hal, MD;  Location: [facility] CV LAB;  Service: Cardiovascular;  Laterality: N/A;   INSERTION OF DIALYSIS CATHETER Right 04/20/2019   Procedure: INSERTION OF DIALYSIS CATHETER, right internal jugular;  Surgeon: Maeola Harman, MD;  Location: [facility] OR;  Service: Vascular;   Laterality: Right;   INSERTION OF DIALYSIS CATHETER Right 09/24/2020   Procedure: INSERTION OF TUNNELED DIALYSIS CATHETER;  Surgeon: Larina Earthly, MD;  Location: [facility] OR;  Service: Vascular;  Laterality: Right;   IR FLUORO GUIDE CV LINE RIGHT  09/22/2020   IR THORACENTESIS ASP PLEURAL SPACE W/IMG GUIDE  05/19/2019   IR US GUIDE VASC ACCESS LEFT  09/22/2020   IR US GUIDE VASC ACCESS RIGHT  09/22/2020   IR VENOCAVAGRAM SVC  09/22/2020   LAMINECTOMY  05/27/2009   Lumbar decompressive laminectomy, fusion and plating for lumbar spinal stensosis   LIGATION OF ARTERIOVENOUS  FISTULA Left 09/24/2020   Procedure: LIGATION OF LEFT ARM ARTERIOVENOUS  FISTULA;  Surgeon: Larina Earthly, MD;  Location: [facility] OR;  Service: Vascular;  Laterality: Left;   LUMBAR LAMINECTOMY/DECOMPRESSION MICRODISCECTOMY Left 03/23/2013   Procedure: LUMBAR LAMINECTOMY/DECOMPRESSION MICRODISCECTOMY 1 LEVEL;  Surgeon: Tia Alert, MD;  Location: [facility];  Service: Neurosurgery;  Laterality: Left;  LUMBAR LAMINECTOMY/DECOMPRESSION MICRODISCECTOMY 1 LEVEL   MASTECTOMY, PARTIAL Left 02/26/2003   ; S/P re-excision of cranial and lateral margins 04/19/2003.    RE-EXCISION OF BREAST CANCER,SUPERIOR MARGINS Right 10/27/2012   Procedure: RE-EXCISION OF BREAST CANCER,SUPERIOR and inferior MARGINS;  Surgeon: Robyne Askew, MD;  Location: [facility] OR;  Service: General;  Laterality: Right;   RE-EXCISION OF BREAST LUMPECTOMY Left 04/2003   TEE WITHOUT CARDIOVERSION N/A 04/04/2019   Procedure: Transesophageal Echocardiogram (Tee);  Surgeon: Linden Dolin, MD;  Location: [facility] OR;  Service: Open Heart Surgery;  Laterality: N/A;   THORACIC AORTIC ANEURYSM REPAIR N/A 04/04/2019   Procedure: THORACIC ASCENDING ANEURYSM REPAIR (AAA)  USING 28 MM X 30 CM HEMASHIELD PLATINUM VASCULAR GRAFT;  Surgeon: Linden Dolin, MD;  Location: [facility] OR;  Service: Open Heart Surgery;  Laterality: N/A;   Family History  Problem Relation Age of Onset   Colon cancer Mother  5   Hypertension Mother    Diabetes Sister 8   Hypertension Sister    Diabetes Brother    Hypertension Brother    Diabetes Brother    Hypertension Brother    Kidney disease Son        s/p renal transplant   Hypertension Son    Diabetes Son    Multiple sclerosis Son    Bone cancer Sister 13   Breast cancer Neg Hx    Cervical cancer Neg Hx     Social History   Tobacco Use   Smoking status: Former    Current packs/day: 0.00    Average packs/day: 0.3  packs/day for 44.0 years (11.0 ttl pk-yrs)    Types: Cigarettes    Start date: 82    Quit date: 2022    Years since quitting: 3.1   Smokeless tobacco: Never   Tobacco comments:    Former smoker 04/21/23  Vaping Use   Vaping status: Never Used  Substance Use Topics   Alcohol use: Not Currently    Alcohol/week: 2.0 standard drinks of alcohol    Types: 2 Cans of beer per week    Comment: "couple beers a weekend", not currently   Drug use: Not Currently    Types: Cocaine    Comment: last in 2005    Procedures   If procedures were preformed on this patient, they are listed below:  Procedures   -------- HPI and MDM generated using voice dictation software and may contain dictation errors. Please contact me for any clarification or with any questions.   Cephus Slater, MD Emergency Medicine PGY-2    Caron Presume, MD 07/22/23 Darnelle Going, MD 07/23/23 [phone]

## 2023-07-22 NOTE — H&P (Signed)
History and Physical      NDIA SAMPATH UJW:[id number] DOB: November 01, 1950 DOA: 07/22/2023; DOS: 07/22/2023  PCP: Fleet Contras, MD  Patient coming from: home   I have personally briefly reviewed patient's old medical records in [facility] Health Link  Chief Complaint: Erythema associated with the permacath in the right upper chest  HPI: Monique Henderson is a 73 y.o. female with medical history significant for incisional disease on hemodialysis on Tuesday, Thursday, Saturday, paroxysmal atrial fibrillation chronically anticoagulated on Eliquis, aortic dissection s/p repair, embolic stroke in September 2023,, chronic systolic/diastolic heart failure, COPD, essential hypertension, hyperlipidemia, who is admitted to [facility] on 07/22/2023 with hyperkalemia after presenting from home to [facility] ED complaining of erythema surrounding the HD cath in the right upper chest.  The patient conveys that she was unable to attend her routine scheduled hemodialysis session on Tuesday, 07/20/2023, noting that her most recently completed hemodialysis session occurred on Sunday, 07/17/2023.  She attend her scheduled hemodialysis session today, Thursday, 07/22/2023.  However, staff at the dialysis center were concerned for potential right-sided tunneled hemodialysis catheter infection, with the patient reporting 1 to 2 days of some erythema around the permacath in the right upper chest associated with some mild tenderness.  At hemodialysis center also reported some purulent appearance with pressure over the tunneled hemodialysis catheter in the right upper chest.  Otherwise, patient denies any associated drainage from this location.  Denies any recent trauma to the tunneled hemodialysis catheter site in the right upper chest.  She denies any additional areas of erythema nor any associated subjective fever, chills, rigors, or generalized myalgias.  Denies any recent significant shortness of breath orthopnea, or PND.   No recent substernal or left-sided chest discomfort nor any recent palpitations, diaphoresis.  Denies any significant recent nausea or any recent vomiting, diarrhea, or abdominal discomfort.    ED Course:  Vital signs in the ED were notable for the following: Afebrile; heart rates in the 70s to 80s; systolic pressures in the 160s 170s; respiratory rate 17-18, oxygen saturation 99% on 2 L nasal cannula, which was started for comfort as opposed to being associate with any objective acute hypoxia.  Labs were notable for the following: CMP was notable for the following: Sodium 140, potassium greater than 7.5, which was confirmed via recheck, bicarbonate 25, anion gap 16, glucose 103, calcium adjusted for mild hypoalbuminemia noted to be 10, avidin 2.7.  Otherwise, liver enzymes are within normal limits.  CBC notable for white cell count 6000, hemoglobin 9.5 associated Neuraceq/recurrent properties and relative demonstration prior hemoglobin data point of 9.2 on 06/27/2023, platelet count 205.  Blood cultures x 2 were collected.  Per my interpretation, EKG in ED demonstrated the following: In comparison to most recent prior EKG performed on 07/02/2023, today's EKG shows sinus rhythm with nonspecific intraventricular conduction delay, heart rate 76, nonspecific T wave inversions in leads I, aVL, V1, V2, of which T wave inversion in aVL appears unchanged from most recent prior EKG, with today's EKG showing no evidence of associated peaked T waves, as well as no evidence of ST changes, including no evidence of ST elevation.  Imaging in the ED, per corresponding formal radiology read, was notable for the following: 1 view chest x-ray, in comparison to most recent prior chest x-ray from 06/17/2023 shows no evidence of acute cardiopulmonary process.  Today's chest x-ray shows increased interstitial markings, although these appear less pronounced relative to chest x-ray from 06/17/2023.  Today's chest x-ray shows no  evidence of infiltrate, edema, effusion, or pneumothorax.  EDP has discussed patient's case with on-call nephrology, Dr. Malen Gauze, who will consult, and is helping to facilitate hemodialysis this evening.  Dr. Malen Gauze conveys concern regarding potentially infected tunneled hemodialysis catheter.  She recommends collection of blood cultures as well as IV antibiotics.    While in the ED, the following were administered: Albuterol nebulizer x 1, NovoLog 5 units IV x 1 dose, amp of D50 x 1, sodium bicarbonate 50 mEq IV x 1 dose, Lokelma 10 g p.o., calcium gluconate 2 g IV x 1 dose.  Subsequently, the patient was admitted for further evaluation management of presenting hyperkalemia as well as concern regarding infected right-sided tunneled hemodialysis catheter.     Review of Systems: As per HPI otherwise 10 point review of systems negative.   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 Chevis Pretty; S/P re-excision of superior and inferior margins 10/27/2012.    Dissection of aorta (HCC) 04/03/2019   ESRD on hemodialysis (HCC) 05/29/2019   Essential hypertension 09/16/2006   GERD 09/16/2006   Hepatitis C    treated and RNA  confirmed not detectable 01/2017   Insomnia 03/14/2015   Malnutrition of moderate degree 05/19/2019   Non compliance with medical treatment 12/04/2017   Normocytic anemia    With thrombocytosis   Osteoarthritis    Right ureteral stone 2002   S/P lumbar spinal fusion 01/18/2014   S/P lumbar decompressive laminectomy, fusion, and plating for lumbar spinal stenosis on 05/27/2009 by Dr. Tia Alert.  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 Tia Alert.     Stroke (cerebrum) (HCC)    Stroke (HCC) 02/23/2022   SVT (supraventricular tachycardia) (HCC) 08/28/2019   Tobacco use disorder 04/19/2009   Uterine fibroid    Wears dentures    top    Past Surgical History:  Procedure Laterality Date   ANTERIOR LAT LUMBAR FUSION N/A 01/18/2014   Procedure: ANTERIOR LATERAL LUMBAR FUSION LUMBAR TWO-THREE;  Surgeon: Tia Alert, MD;  Location: [facility];  Service: Neurosurgery;  Laterality: N/A;   ANTERIOR LUMBAR FUSION  01/18/2014   AV FISTULA PLACEMENT Left 04/20/2019   Procedure: ARTERIOVENOUS (AV) FISTULA CREATION;  Surgeon: Maeola Harman, MD;  Location: [facility] OR;  Service: Vascular;  Laterality: Left;   BACK SURGERY     BREAST LUMPECTOMY Left 01/2003   BREAST LUMPECTOMY Right 2014   BREAST LUMPECTOMY WITH NEEDLE LOCALIZATION AND AXILLARY SENTINEL LYMPH NODE BX Right 10/13/2012   Procedure: BREAST LUMPECTOMY WITH NEEDLE LOCALIZATION;  Surgeon: Caleen Essex  III, MD;  Location: [facility];  Service: General;  Laterality: Right;  Right breast wire localized lumpectomy   DIALYSIS/PERMA CATHETER INSERTION N/A 05/28/2023   Procedure: DIALYSIS/PERMA CATHETER INSERTION;  Surgeon: Ethelene Hal, MD;  Location: [facility];  Service: Cardiovascular;  Laterality: N/A;   INSERTION  OF DIALYSIS CATHETER Right 04/20/2019   Procedure: INSERTION OF DIALYSIS CATHETER, right internal jugular;  Surgeon: Maeola Harman, MD;  Location: [facility] OR;  Service: Vascular;  Laterality: Right;   INSERTION OF DIALYSIS CATHETER Right 09/24/2020   Procedure: INSERTION OF TUNNELED DIALYSIS CATHETER;  Surgeon: Larina Earthly, MD;  Location: [facility] OR;  Service: Vascular;  Laterality: Right;   IR FLUORO GUIDE CV LINE RIGHT  09/22/2020   IR THORACENTESIS ASP PLEURAL SPACE W/IMG GUIDE  05/19/2019   IR US GUIDE VASC ACCESS LEFT  09/22/2020   IR US GUIDE VASC ACCESS RIGHT  09/22/2020   IR VENOCAVAGRAM SVC  09/22/2020   LAMINECTOMY  05/27/2009   Lumbar decompressive laminectomy, fusion and plating for lumbar spinal stensosis   LIGATION OF ARTERIOVENOUS  FISTULA Left 09/24/2020   Procedure: LIGATION OF LEFT ARM ARTERIOVENOUS  FISTULA;  Surgeon: Larina Earthly, MD;  Location: [facility] OR;  Service: Vascular;  Laterality: Left;   LUMBAR LAMINECTOMY/DECOMPRESSION MICRODISCECTOMY Left 03/23/2013   Procedure: LUMBAR LAMINECTOMY/DECOMPRESSION MICRODISCECTOMY 1 LEVEL;  Surgeon: Tia Alert, MD;  Location: [facility];  Service: Neurosurgery;  Laterality: Left;  LUMBAR LAMINECTOMY/DECOMPRESSION MICRODISCECTOMY 1 LEVEL   MASTECTOMY, PARTIAL Left 02/26/2003   ; S/P re-excision of cranial and lateral margins 04/19/2003.    RE-EXCISION OF BREAST CANCER,SUPERIOR MARGINS Right 10/27/2012   Procedure: RE-EXCISION OF BREAST CANCER,SUPERIOR and inferior MARGINS;  Surgeon: Robyne Askew, MD;  Location: [facility] OR;  Service: General;  Laterality: Right;   RE-EXCISION OF BREAST LUMPECTOMY Left 04/2003   TEE WITHOUT CARDIOVERSION N/A 04/04/2019   Procedure: Transesophageal Echocardiogram (Tee);  Surgeon: Linden Dolin, MD;  Location: [facility] OR;  Service: Open Heart Surgery;  Laterality: N/A;   THORACIC AORTIC ANEURYSM REPAIR N/A 04/04/2019   Procedure: THORACIC ASCENDING ANEURYSM REPAIR (AAA)  USING 28 MM X 30 CM HEMASHIELD PLATINUM  VASCULAR GRAFT;  Surgeon: Linden Dolin, MD;  Location: [facility] OR;  Service: Open Heart Surgery;  Laterality: N/A;    Social History:  reports that she quit smoking about 3 years ago. Her smoking use included cigarettes. She started smoking about 47 years ago. She has a 11 pack-year smoking history. She has never used smokeless tobacco. She reports that she does not currently use alcohol after a past usage of about 2.0 standard drinks of alcohol per week. She reports that she does not currently use drugs after having used the following drugs: Cocaine.   Allergies  Allergen Reactions   Shrimp [Shellfish Allergy] Shortness Of Breath   Bactroban [Mupirocin] Other (See Comments)    "Sores in nose"   Hydrocodone Itching, Nausea Only and Nausea And Vomiting   Latex Itching    Latex gloves cause itchiness and a rash   Chlorhexidine Gluconate Itching   Tylenol [Acetaminophen] Itching    Takes Percocet at home 05/06/23   Zestril [Lisinopril] Cough    Family History  Problem Relation Age of Onset   Colon cancer Mother 55   Hypertension Mother    Diabetes Sister 33   Hypertension Sister    Diabetes Brother    Hypertension Brother    Diabetes Brother    Hypertension Brother  Kidney disease Son        s/p renal transplant   Hypertension Son    Diabetes Son    Multiple sclerosis Son    Bone cancer Sister 56   Breast cancer Neg Hx    Cervical cancer Neg Hx      Prior to Admission medications   Medication Sig Start Date End Date Taking? Authorizing Provider  albuterol (PROVENTIL) (2.5 MG/3ML) 0.083% nebulizer solution Take 2.5 mg by nebulization every 6 (six) hours as needed for wheezing or shortness of breath.    [provider]  albuterol (VENTOLIN HFA) 108 (90 Base) MCG/ACT inhaler Inhale 2 puffs into the lungs every 6 (six) hours as needed for wheezing or shortness of breath. 07/30/20   Janeece Agee, NP  amLODipine (NORVASC) 10 MG tablet Take 1 tablet (10 mg total) by  mouth daily. 03/06/22   Setzer, Lynnell Jude, PA-C  apixaban (ELIQUIS) 2.5 MG TABS tablet Take 1 tablet (2.5 mg total) by mouth 2 (two) times daily. 03/06/22   Setzer, Lynnell Jude, PA-C  carvedilol (COREG) 25 MG tablet Take 1 tablet (25 mg total) by mouth 2 (two) times daily with a meal. 05/01/22 06/16/24  Ghimire, Werner Lean, MD  Cholecalciferol (VITAMIN D-3 PO) Take 1 capsule by mouth daily.    [provider]  cloNIDine (CATAPRES) 0.1 MG tablet Take 1 tablet (0.1 mg total) by mouth 3 (three) times daily. 06/19/23   Hughie Closs, MD  hydrALAZINE (APRESOLINE) 100 MG tablet Take 1 tablet (100 mg total) by mouth every 8 (eight) hours. 12/18/22   Corky Crafts, MD  isosorbide mononitrate (IMDUR) 30 MG 24 hr tablet TAKE 1 TABLET (30MG ) ON DIALYSIS DAYS AND 2 TABLETS (60 MG) ON NON DIALYSIS DAYS. 01/12/23   Corky Crafts, MD  Methoxy PEG-Epoetin Beta (MIRCERA IJ) every 14 (fourteen) days. 07/06/23 07/04/24  [provider]  naloxone [facility]) nasal spray 4 mg/0.1 mL 1 spray as directed. 02/19/22   [provider]  oxyCODONE-acetaminophen (PERCOCET) 10-325 MG tablet Take 1 tablet by mouth in the morning and at bedtime. 03/12/22   [provider]  pantoprazole (PROTONIX) 40 MG tablet TAKE 1 TABLET (40 MG TOTAL) BY MOUTH 2 (TWO) TIMES DAILY. 05/13/23   Georganna Skeans, MD  rosuvastatin (CRESTOR) 10 MG tablet TAKE 1 TABLET (10 MG TOTAL) BY MOUTH DAILY FOR CHOLESTEROL 08/07/22   Georganna Skeans, MD  senna-docusate (SENOKOT-S) 8.6-50 MG tablet Take 1 tablet by mouth 2 (two) times daily between meals as needed for mild constipation. 08/14/22   Almon Hercules, MD  sevelamer carbonate (RENVELA) 2.4 g PACK Take 2.4 g by mouth 3 (three) times daily with meals. 03/06/22   Setzer, Lynnell Jude, PA-C  traZODone (DESYREL) 50 MG tablet Take 25-50 mg by mouth at bedtime as needed for sleep. 03/19/23   [provider]     Objective    Physical Exam: Vitals:   07/22/23 1456 07/22/23 1530  07/22/23 1851  BP:  (!) 164/96 (!) 179/98  Pulse:  78 81  Resp:  18 17  Temp:  97.8 F (36.6 C) 97.7 F (36.5 C)  TempSrc:  Oral Oral  SpO2: 99% 99% 99%    General: appears to be stated age; alert, oriented Skin: warm, dry, erythema surrounding site of tunneled hemodialysis catheter in the right upper chest, associated with mild tenderness without corresponding crepitus head:  AT/[place] Mouth:  Oral mucosa membranes appear moist, normal dentition Neck: supple; trachea midline Heart:  RRR; did not  appreciate any M/R/G Lungs: CTAB, did not appreciate any wheezes, rales, or rhonchi Abdomen: + BS; soft, ND, NT Vascular: 2+ pedal pulses b/l; 2+ radial pulses b/l Extremities: Trace edema bilateral lower extremities, no muscle wasting Neuro: sensation intact in upper and lower extremities b/l    Labs on Admission: I have personally reviewed following labs and imaging studies  CBC: Recent Labs  Lab 07/22/23 1553  WBC 6.0  NEUTROABS 4.0  HGB 9.5*  HCT 30.3*  MCV 94.7  PLT 205   Basic Metabolic Panel: Recent Labs  Lab 07/22/23 1553  NA 140  K >7.5*  CL 99  CO2 25  GLUCOSE 103*  BUN 74*  CREATININE 14.08*  CALCIUM 9.0  MG 2.7*   GFR: CrCl cannot be calculated (Unknown ideal weight.). Liver Function Tests: Recent Labs  Lab 07/22/23 1553  AST 16  ALT 9  ALKPHOS 62  BILITOT 0.6  PROT 7.0  ALBUMIN 3.5   No results for input(s): "LIPASE", "AMYLASE" in the last 168 hours. No results for input(s): "AMMONIA" in the last 168 hours. Coagulation Profile: No results for input(s): "INR", "PROTIME" in the last 168 hours. Cardiac Enzymes: No results for input(s): "CKTOTAL", "CKMB", "CKMBINDEX", "TROPONINI" in the last 168 hours. BNP (last 3 results) No results for input(s): "PROBNP" in the last 8760 hours. HbA1C: No results for input(s): "HGBA1C" in the last 72 hours. CBG: No results for input(s): "GLUCAP" in the last 168 hours. Lipid Profile: No results for input(s):  "CHOL", "HDL", "LDLCALC", "TRIG", "CHOLHDL", "LDLDIRECT" in the last 72 hours. Thyroid Function Tests: No results for input(s): "TSH", "T4TOTAL", "FREET4", "T3FREE", "THYROIDAB" in the last 72 hours. Anemia Panel: No results for input(s): "VITAMINB12", "FOLATE", "FERRITIN", "TIBC", "IRON", "RETICCTPCT" in the last 72 hours. Urine analysis:    Component Value Date/Time   COLORURINE AMBER (A) 12/27/2021 1840   APPEARANCEUR TURBID (A) 12/27/2021 1840   LABSPEC 1.013 12/27/2021 1840   PHURINE 8.0 12/27/2021 1840   GLUCOSEU NEGATIVE 12/27/2021 1840   GLUCOSEU NEG mg/dL 19/14/7829 5621   HGBUR MODERATE (A) 12/27/2021 1840   BILIRUBINUR NEGATIVE 12/27/2021 1840   BILIRUBINUR negative 05/04/2018 0910   KETONESUR NEGATIVE 12/27/2021 1840   PROTEINUR >=300 (A) 12/27/2021 1840   UROBILINOGEN 0.2 05/04/2018 0910   UROBILINOGEN 1 02/14/2014 0946   NITRITE NEGATIVE 12/27/2021 1840   LEUKOCYTESUR LARGE (A) 12/27/2021 1840    Radiological Exams on Admission: DG Chest Portable 1 View Result Date: 07/22/2023 CLINICAL DATA:  c/f infected permacath site. EXAM: PORTABLE CHEST 1 VIEW COMPARISON:  06/17/2023 FINDINGS: Low lung volume. There are mildly increased interstitial markings, less pronounced than the prior exam. There patchy atelectatic changes/scarring at the left lung base. Bilateral lung fields are otherwise clear. Bilateral costophrenic angles are clear. Note is made of elevated right hemidiaphragm. Stable cardio-mediastinal silhouette. Sternotomy wires noted. No acute osseous abnormalities. The soft tissues are within normal limits. Right IJ central venous catheter noted with its tip overlying the right atrium. IMPRESSION: *Mildly increased interstitial markings, less pronounced than the prior exam. No acute cardiopulmonary abnormality. Electronically Signed   By: Jules Schick M.D.   On: 07/22/2023 16:14      Assessment/Plan   Principal Problem:   Hyperkalemia Active Problems:    Paroxysmal atrial fibrillation (HCC)   Essential hypertension   ESRD (end stage renal disease) (HCC)   History of anemia due to chronic kidney disease   HLD (hyperlipidemia)   Chronic combined systolic and diastolic heart failure (HCC)   Complication associated with  dialysis catheter   History of COPD     #) Hyperkalemia: presenting serum potassium level of greater than 7.5, which was confirmed upon recheck.  This is in the setting of her history of end-stage renal disease on hemodialysis, noting that she has now missed her to most recent hemodialysis sessions, as above.  Potential minor pharmacologic contribution in the form of outpatient Coreg.  Does not appear to be associated with corresponding EKG changes.  No evidence of bradycardia, the patient appears hemodynamically stable.  EDP is discussed patient's case with on-call nephrology, Dr. Malen Gauze, who will formally consult, and is assisting with arrangements for emergent overnight hemodialysis.  In the meantime, the patient has received shifting pharmacologic intervention, including albuterol nebulizer, IV NovoLog, IV sodium bicarbonate, in addition to Lokelma 10 g p.o. x 1 dose.  She also received a dose of calcium gluconate 2 g IV for myocardial protection.   Plan: monitor on telemetry.  Nephrology consulted, with plan for emergent overnight hemodialysis, as above.  Post-dialysis cmp ordered.  Holding home Coreg for now.  Repeat magnesium level in the morning.                 #) Concern for infected right-sided tunneled hemodialysis permacath: In the setting of 1 to 2 days erythema surrounding right upper chest permacath site with corresponding tenderness, report of some purulent drainage.  No evidence of crepitus on exam or subcu air identified on imaging to suggest necrotizing fasciitis.  Dr. Malen Gauze of Nephrology has been consulted and has evaluated the patient's permacath, main concern for associated infection.  Collection  of blood cultures, which have been obtained, as well as initiation of IV antibiotics, to include gram-positive via IV vancomycin.  May need line holiday.  Of note, in the absence of objective fever and in the absence of leukocytosis, SIRS criteria for sepsis are not currently met.  No evidence of hypotension.  No evidence of additional underlying infectious process at this time, including chest x-ray which showed no evidence of acute cardiopulmonary process, including no evidence of infiltrate.  Plan: nephrology consulted.  Will monitor for results of blood cultures x 2.  Start IV vancomycin.  Repeat CBC in the morning.  Prn acetaminophen for fever or pain.                     #) ESRD: on HD (schedule: Tuesday, Thursday, Saturday).  Missed her 2 most recent hemodialysis sessions, with most recent hemodialysis session noted to have been completed on Saturday, 07/17/2023.  In this context, her presenting labs are notable for hyperkalemia, for which nephrology has been consulted, with plan for emergent overnight hemodialysis, as above.  Additionally, presentation appears associated with mild anion gap metabolic acidosis.  No evidence of significant acute volume overload at this time.  As noted above, presentation is also concerning for potential infection relating to her right-sided tunneled hemodialysis catheter.  Outpatient medications include Renvela.  Plan: monitor strict I's/O's, daily weights.  Nephrology consulted, plan for emergent hemodialysis this evening, as above.  On telemetry.  Post hemodialysis CMP.  Recheck serum magnesium level and check phosphorus level in the AM.  Further evaluation management concern regarding potential infected permacath, including results of blood cultures x 2 as well as initiation of IV vancomycin.  CBC in the morning.  Resume home Renvela.                      #) Paroxysmal atrial fibrillation: Documented  history of such. In  setting of CHA2DS2-VASc score of  6, there is an indication for chronic anticoagulation for thromboembolic prophylaxis. Consistent with this, patient is chronically anticoagulated on Eliquis. Home AV nodal blocking regimen: Coreg, however, will hold home Coreg for now given the patient's presenting hyper kalemia.  Most recent echocardiogram occurred on 06/03/2023, and was notable for moderately dilated left atrium, as well as trivial mitral regurgitation. Presenting EKG sinus rhythm without overt evidence of acute ischemic changes.   Plan: monitor strict I's & O's and daily weights. CMP/CBC in AM. Check serum mag level. Continue home Eliquis.  Monitor on telemetry.  Holding home Coreg for now, as above.  Further evaluation management of presenting hypokalemia, as above.                         #) Chronic systolic/diastolic heart failure as well as chronic right-sided systolic heart failure: documented history of such, with most recent echocardiogram performed on 06/03/2023, which is notable for LVEF 45%, grade 2 diastolic dysfunction, moderately reduced right ventricular systolic function, with additional results as conveyed above. No clinical or radiographic evidence to suggest acutely decompensated heart failure at this time, although the patient is at risk for ensuing development of acute volume overload given her history of end-stage renal disease on hemodialysis, with having missed 2 most recent hemodialysis sessions.  As noted above, nephrology has been consulted, and are planning for emergent overnight hemodialysis.  Plan: monitor strict I's & O's and daily weights. Repeat CMP in AM.  Recheck serum mag level in the morning.  Nephrology consulted, plan for emergent hemodialysis overnight.  Holding home Coreg for now in the setting of presenting hyperkalemia.                   #) COPD: Documented history thereof, without clinical evidence of acute exacerbation at this  time.   outpatient respiratory regimen includes the following: Prn albuterol Nebulizer.   Plan: cont outpatient  Prn albuterol nebulizer. recheck CMP and serum magnesium level in the AM.                   #) Essential Hypertension: documented h/o such, with outpatient antihypertensive regimen including Coreg, amlodipine, oral clonidine, hydralazine, Imdur.  SBP's in the ED today: 160s to 170s mmHg, which should improve with plan for emergent hemodialysis overnight.  Given presenting hyperkalemia as well as concern for infected right-sided tunneled hemodialysis catheter, will be conservative initially with resumption of outpatient antihypertensive medications, as below.  Plan: Close monitoring of subsequent BP via routine VS. hold home Coreg and Imdur for now.  Will resume home Norvasc, clonidine, hydralazine.  Monitor strict I's and O's and daily weights.  Monitor on telemetry.  Repeat CMP following dialysis session.                   #) Hyperlipidemia: documented h/o such. On rosuvastatin as outpatient.   Plan: continue home statin.                  #) Anemia of chronic kidney disease: Documented history of such, a/w with baseline hgb range 9-11, with presenting hgb consistent with this range, in the absence of any overt evidence of active bleed.     Plan: Repeat CBC in the morning.         DVT prophylaxis: SCD's plus resumption of home Eliquis Code Status: DNR/DNI, per patient and consistent with code status documentation  from my recent prior hospitalization Family Communication: none Disposition Plan: Per Rounding Team Consults called: EDP d/w on-call nephrology, Dr. Malen Gauze, who will formally consult, as further detailed above;  Admission status: Inpatient     I SPENT GREATER THAN 75  MINUTES IN CLINICAL CARE TIME/MEDICAL DECISION-MAKING IN COMPLETING THIS ADMISSION.      Chaney Born Jakson Delpilar DO [facility]  From 7PM -  7AM   07/22/2023, 8:13 PM

## 2023-07-22 NOTE — Consult Note (Signed)
[facility] Renal Consultation Note  Requesting MD: Cathren Laine, MD Indication for Consultation:  ESRD  Chief complaint: "worried about dialysis catheter"  HPI:  Monique Henderson is a 73 y.o. female with a history of end-stage renal disease on hemodialysis TTS, paroxysmal atrial fibrillation, coronary artery disease, and prior stroke who presented to the hospital from her dialysis unit after nursing staff at the dialysis unit were worried about her tunneled dialysis catheter possibly being infected.  Per report, the tech at dialysis was able to express some pus from this site.  She did not get hemodialysis there today as a result.  The dialysis catheter was placed on 05/28/23 by Dr. Juel Burrow.  The patient states that she showers with the catheter, stating that she does at least cover it.  Nephrology is consulted for assistance with management of hemodialysis and ESRD.  The ER called back to let me know that she was noted to have hyperkalemia with a potassium of greater than 7.5.  Per outpatient EMR her last HD was on 07/17/23 with post-weight of 46.5 kg (relative to EDW of 41 kg).  She states that she fell on Tuesday so didn't go to dialysis.  She has not met her EDW in quite some time.  Her EDW was lowered to 41 kg from 47 kg in mid-Jan.  She hadn't really been meeting the 47 kg weight, either.  She usually pulls 1.5 - 2kg recently.   PMHx:   Past Medical History:  Diagnosis Date   Acute cardioembolic stroke (HCC) 02/26/2022   AF (paroxysmal atrial fibrillation) (HCC) 05/29/2019   on Coumadin   Aortic atherosclerosis (HCC) 07/05/2019   Aortic dissection (HCC) 04/04/2019   s/p repair   Bone spur 2008   Right calcaneal foot spur   Breast cancer (HCC) 2004   Ductal carcinoma in situ of the left breast; S/P left partial mastectomy 02/26/2003; S/P re-excision of cranial and lateral margins11/18/2004.radiation   Carotid artery disease (HCC)    Cerebral thrombosis with cerebral  infarction 05/22/2019   Chronic HFrEF (heart failure with reduced ejection fraction) (HCC)    Chronic low back pain 06/22/2016   Chronic obstructive lung disease (HCC) 01/16/2017   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 Chevis Pretty; S/P re-excision of superior and inferior margins 10/27/2012.    Dissection of aorta (HCC) 04/03/2019   ESRD on hemodialysis (HCC) 05/29/2019   Essential hypertension 09/16/2006   GERD 09/16/2006   Hepatitis C    treated and RNA confirmed not detectable 01/2017   Insomnia 03/14/2015   Malnutrition of moderate degree 05/19/2019   Non compliance with medical treatment 12/04/2017   Normocytic anemia    With thrombocytosis   Osteoarthritis    Right ureteral stone 2002   S/P lumbar spinal fusion 01/18/2014   S/P lumbar decompressive laminectomy, fusion, and plating for lumbar spinal stenosis on 05/27/2009 by Dr. Tia Alert.  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 Tia Alert.     Stroke (cerebrum) [facility])    Stroke (HCC) 02/23/2022   SVT (supraventricular tachycardia) (HCC) 08/28/2019   Tobacco use disorder 04/19/2009   Uterine fibroid    Wears dentures    top    Past Surgical History:  Procedure Laterality Date   ANTERIOR LAT LUMBAR  FUSION N/A 01/18/2014   Procedure: ANTERIOR LATERAL LUMBAR FUSION LUMBAR TWO-THREE;  Surgeon: Tia Alert, MD;  Location: [facility];  Service: Neurosurgery;  Laterality: N/A;   ANTERIOR LUMBAR FUSION  01/18/2014   AV FISTULA PLACEMENT Left 04/20/2019   Procedure: ARTERIOVENOUS (AV) FISTULA CREATION;  Surgeon: Maeola Harman, MD;  Location: [facility] OR;  Service: Vascular;  Laterality: Left;   BACK SURGERY     BREAST LUMPECTOMY Left 01/2003   BREAST LUMPECTOMY  Right 2014   BREAST LUMPECTOMY WITH NEEDLE LOCALIZATION AND AXILLARY SENTINEL LYMPH NODE BX Right 10/13/2012   Procedure: BREAST LUMPECTOMY WITH NEEDLE LOCALIZATION;  Surgeon: Robyne Askew, MD;  Location: [facility];  Service: General;  Laterality: Right;  Right breast wire localized lumpectomy   DIALYSIS/PERMA CATHETER INSERTION N/A 05/28/2023   Procedure: DIALYSIS/PERMA CATHETER INSERTION;  Surgeon: Ethelene Hal, MD;  Location: [facility];  Service: Cardiovascular;  Laterality: N/A;   INSERTION OF DIALYSIS CATHETER Right 04/20/2019   Procedure: INSERTION OF DIALYSIS CATHETER, right internal jugular;  Surgeon: Maeola Harman, MD;  Location: [facility] OR;  Service: Vascular;  Laterality: Right;   INSERTION OF DIALYSIS CATHETER Right 09/24/2020   Procedure: INSERTION OF TUNNELED DIALYSIS CATHETER;  Surgeon: Larina Earthly, MD;  Location: [facility] OR;  Service: Vascular;  Laterality: Right;   IR FLUORO GUIDE CV LINE RIGHT  09/22/2020   IR THORACENTESIS ASP PLEURAL SPACE W/IMG GUIDE  05/19/2019   IR US GUIDE VASC ACCESS LEFT  09/22/2020   IR US GUIDE VASC ACCESS RIGHT  09/22/2020   IR VENOCAVAGRAM SVC  09/22/2020   LAMINECTOMY  05/27/2009   Lumbar decompressive laminectomy, fusion and plating for lumbar spinal stensosis   LIGATION OF ARTERIOVENOUS  FISTULA Left 09/24/2020   Procedure: LIGATION OF LEFT ARM ARTERIOVENOUS  FISTULA;  Surgeon: Larina Earthly, MD;  Location: [facility] OR;  Service: Vascular;  Laterality: Left;   LUMBAR LAMINECTOMY/DECOMPRESSION MICRODISCECTOMY Left 03/23/2013   Procedure: LUMBAR LAMINECTOMY/DECOMPRESSION MICRODISCECTOMY 1 LEVEL;  Surgeon: Tia Alert, MD;  Location: [facility];  Service: Neurosurgery;  Laterality: Left;  LUMBAR LAMINECTOMY/DECOMPRESSION MICRODISCECTOMY 1 LEVEL   MASTECTOMY, PARTIAL Left 02/26/2003   ; S/P re-excision of cranial and lateral margins 04/19/2003.    RE-EXCISION OF BREAST CANCER,SUPERIOR MARGINS Right 10/27/2012   Procedure:  RE-EXCISION OF BREAST CANCER,SUPERIOR and inferior MARGINS;  Surgeon: Robyne Askew, MD;  Location: [facility] OR;  Service: General;  Laterality: Right;   RE-EXCISION OF BREAST LUMPECTOMY Left 04/2003   TEE WITHOUT CARDIOVERSION N/A 04/04/2019   Procedure: Transesophageal Echocardiogram (Tee);  Surgeon: Linden Dolin, MD;  Location: [facility] OR;  Service: Open Heart Surgery;  Laterality: N/A;   THORACIC AORTIC ANEURYSM REPAIR N/A 04/04/2019   Procedure: THORACIC ASCENDING ANEURYSM REPAIR (AAA)  USING 28 MM X 30 CM HEMASHIELD PLATINUM VASCULAR GRAFT;  Surgeon: Linden Dolin, MD;  Location: [facility] OR;  Service: Open Heart Surgery;  Laterality: N/A;    Family Hx:  Family History  Problem Relation Age of Onset   Colon cancer Mother 30   Hypertension Mother    Diabetes Sister 23   Hypertension Sister    Diabetes Brother    Hypertension Brother    Diabetes Brother    Hypertension Brother    Kidney disease Son        s/p renal transplant   Hypertension Son    Diabetes Son    Multiple sclerosis Son    Bone cancer Sister 66  Breast cancer Neg Hx    Cervical cancer Neg Hx     Social History:  reports that she quit smoking about 3 years ago. Her smoking use included cigarettes. She started smoking about 47 years ago. She has a 11 pack-year smoking history. She has never used smokeless tobacco. She reports that she does not currently use alcohol after a past usage of about 2.0 standard drinks of alcohol per week. She reports that she does not currently use drugs after having used the following drugs: Cocaine.  Allergies:  Allergies  Allergen Reactions   Shrimp [Shellfish Allergy] Shortness Of Breath   Bactroban [Mupirocin] Other (See Comments)    "Sores in nose"   Hydrocodone Itching, Nausea Only and Nausea And Vomiting   Latex Itching    Latex gloves cause itchiness and a rash   Chlorhexidine Gluconate Itching   Tylenol [Acetaminophen] Itching    Takes Percocet at home 05/06/23   Zestril  [Lisinopril] Cough    Medications: Prior to Admission medications   Medication Sig Start Date End Date Taking? Authorizing Provider  albuterol (PROVENTIL) (2.5 MG/3ML) 0.083% nebulizer solution Take 2.5 mg by nebulization every 6 (six) hours as needed for wheezing or shortness of breath.    [provider]  albuterol (VENTOLIN HFA) 108 (90 Base) MCG/ACT inhaler Inhale 2 puffs into the lungs every 6 (six) hours as needed for wheezing or shortness of breath. 07/30/20   Janeece Agee, NP  amLODipine (NORVASC) 10 MG tablet Take 1 tablet (10 mg total) by mouth daily. 03/06/22   Setzer, Lynnell Jude, PA-C  apixaban (ELIQUIS) 2.5 MG TABS tablet Take 1 tablet (2.5 mg total) by mouth 2 (two) times daily. 03/06/22   Setzer, Lynnell Jude, PA-C  carvedilol (COREG) 25 MG tablet Take 1 tablet (25 mg total) by mouth 2 (two) times daily with a meal. 05/01/22 06/16/24  Ghimire, Werner Lean, MD  Cholecalciferol (VITAMIN D-3 PO) Take 1 capsule by mouth daily.    [provider]  cloNIDine (CATAPRES) 0.1 MG tablet Take 1 tablet (0.1 mg total) by mouth 3 (three) times daily. 06/19/23   Hughie Closs, MD  hydrALAZINE (APRESOLINE) 100 MG tablet Take 1 tablet (100 mg total) by mouth every 8 (eight) hours. 12/18/22   Corky Crafts, MD  isosorbide mononitrate (IMDUR) 30 MG 24 hr tablet TAKE 1 TABLET (30MG ) ON DIALYSIS DAYS AND 2 TABLETS (60 MG) ON NON DIALYSIS DAYS. 01/12/23   Corky Crafts, MD  Methoxy PEG-Epoetin Beta (MIRCERA IJ) every 14 (fourteen) days. 07/06/23 07/04/24  [provider]  naloxone [facility]) nasal spray 4 mg/0.1 mL 1 spray as directed. 02/19/22   [provider]  oxyCODONE-acetaminophen (PERCOCET) 10-325 MG tablet Take 1 tablet by mouth in the morning and at bedtime. 03/12/22   [provider]  pantoprazole (PROTONIX) 40 MG tablet TAKE 1 TABLET (40 MG TOTAL) BY MOUTH 2 (TWO) TIMES DAILY. 05/13/23   Georganna Skeans, MD  rosuvastatin (CRESTOR) 10 MG tablet TAKE 1 TABLET  (10 MG TOTAL) BY MOUTH DAILY FOR CHOLESTEROL 08/07/22   Georganna Skeans, MD  senna-docusate (SENOKOT-S) 8.6-50 MG tablet Take 1 tablet by mouth 2 (two) times daily between meals as needed for mild constipation. 08/14/22   Almon Hercules, MD  sevelamer carbonate (RENVELA) 2.4 g PACK Take 2.4 g by mouth 3 (three) times daily with meals. 03/06/22   Setzer, Lynnell Jude, PA-C  traZODone (DESYREL) 50 MG tablet Take 25-50 mg by mouth at bedtime as needed for sleep. 03/19/23  [provider]   I have reviewed the patient's current and reported prior to admission medications.  Labs:     Latest Ref Rng & Units 07/22/2023    3:53 PM 06/27/2023    3:40 AM 06/26/2023   10:38 AM  BMP  Glucose 70 - 99 mg/dL 604  540  981   BUN 8 - 23 mg/dL 74  64  46   Creatinine 0.44 - 1.00 mg/dL 19.14  78.29  5.62   Sodium 135 - 145 mmol/L 140  137  134   Potassium 3.5 - 5.1 mmol/L >7.5  C 4.5  4.4   Chloride 98 - 111 mmol/L 99  98  94   CO2 22 - 32 mmol/L 25  23  25    Calcium 8.9 - 10.3 mg/dL 9.0  7.8  8.4     C Corrected result     ROS:  Pertinent items noted in HPI and remainder of comprehensive ROS otherwise negative.  Physical Exam: Vitals:   07/22/23 1530 07/22/23 1851  BP: (!) 164/96 (!) 179/98  Pulse: 78 81  Resp: 18 17  Temp: 97.8 F (36.6 C) 97.7 F (36.5 C)  SpO2: 99% 99%     General: adult female in bed in NAD  HEENT: NCAT Eyes: EOMI sclera anicteric Neck: supple trachea midline  Heart: S1S2 no rub Lungs: clear to auscultation and unlabored at rest on 3 liters oxygen Abdomen: soft/nt/nd  Extremities: trace to 1+ edema lower extremities  Skin: as below skin tear and erythema near catheter; no other lesion or rash on extremities exposed Neuro: alert and oriented x 3; dysarthria  Psych normal mood and affect Access:  RIJ tunneled catheter in place with erythema surrounding insertion site as well as skin tear  Outpatient HD orders:  [place]/[facility] 3.5 hours TTS BF 400  DF auto 1.5   EDW 41 kg 2K/2Ca Tunneled catheter  Last HD on 07/17/23 with post-weight of 46.5 kg Medications:  sensipar 60 mg three times a week with HD hectorol 9 mcg three times a week with HD Mircera 225 mcg every 2 weeks (last given outpatient on 07/06/23 at 100 mcg dose) venofer 50 mg weekly    Assessment/Plan:   # ESRD  - HD tonight   # Hyperkalemia - appreciate bicarb and calcium and insulin/dextrose - Emergent HD tonight  - I have changed her diet to a renal diet - lokelma 10 grams once - repeat K after 1 hour of 1K bath and page nephrology if over 5.5   # Concern for dialysis catheter infection  - Requested two sets of Blood cultures - order is in place - RN is going to try and get the second set  - antibiotics per primary team discretion  - Her catheter insertion site is concerning at the very least given the skin tear and she would likely benefit from a new catheter at a different site.  Will need to see if she needs a line holiday first  # HTN  - optimize UF with HD - Continue home meds  # Secondary hyperparathyroidism, renal  - Continue sevelamer - I have changed her to a renal diet   - To avoid duplication, please do not list sensipar or hectorol on her medication list on discharge  # Anemia of CKD  - note that her ESA is due; to be given 150 mcg every Friday for now    Would recommend admission and appreciate medicine team.  We are concerned  about a possible infection of her tunneled dialysis catheter  Estanislado Emms 07/22/2023, 9:18 PM

## 2023-07-23 ENCOUNTER — Other Ambulatory Visit: Payer: Self-pay

## 2023-07-23 DIAGNOSIS — E875 Hyperkalemia: Secondary | ICD-10-CM | POA: Diagnosis not present

## 2023-07-23 LAB — COMPREHENSIVE METABOLIC PANEL
ALT: 9 U/L (ref 0–44)
AST: 15 U/L (ref 15–41)
Albumin: 3.3 g/dL — ABNORMAL LOW (ref 3.5–5.0)
Alkaline Phosphatase: 52 U/L (ref 38–126)
Anion gap: 13 (ref 5–15)
BUN: 22 mg/dL (ref 8–23)
CO2: 29 mmol/L (ref 22–32)
Calcium: 9 mg/dL (ref 8.9–10.3)
Chloride: 95 mmol/L — ABNORMAL LOW (ref 98–111)
Creatinine, Ser: 6.04 mg/dL — ABNORMAL HIGH (ref 0.44–1.00)
GFR, Estimated: 7 mL/min — ABNORMAL LOW (ref >60)
Glucose, Bld: 90 mg/dL (ref 70–99)
Potassium: 4.5 mmol/L (ref 3.5–5.1)
Sodium: 137 mmol/L (ref 135–145)
Total Bilirubin: 0.7 mg/dL (ref 0.0–1.2)
Total Protein: 6.6 g/dL (ref 6.5–8.1)

## 2023-07-23 LAB — CBC WITH DIFFERENTIAL/PLATELET
Abs Immature Granulocytes: 0.02 10*3/uL (ref 0.00–0.07)
Basophils Absolute: 0 10*3/uL (ref 0.0–0.1)
Basophils Relative: 1 %
Eosinophils Absolute: 0.3 10*3/uL (ref 0.0–0.5)
Eosinophils Relative: 6 %
HCT: 29.9 % — ABNORMAL LOW (ref 36.0–46.0)
Hemoglobin: 9.4 g/dL — ABNORMAL LOW (ref 12.0–15.0)
Immature Granulocytes: 0 %
Lymphocytes Relative: 12 %
Lymphs Abs: 0.7 10*3/uL (ref 0.7–4.0)
MCH: 29.3 pg (ref 26.0–34.0)
MCHC: 31.4 g/dL (ref 30.0–36.0)
MCV: 93.1 fL (ref 80.0–100.0)
Monocytes Absolute: 0.7 10*3/uL (ref 0.1–1.0)
Monocytes Relative: 13 %
Neutro Abs: 3.7 10*3/uL (ref 1.7–7.7)
Neutrophils Relative %: 68 %
Platelets: 192 10*3/uL (ref 150–400)
RBC: 3.21 MIL/uL — ABNORMAL LOW (ref 3.87–5.11)
RDW: 19 % — ABNORMAL HIGH (ref 11.5–15.5)
WBC: 5.4 10*3/uL (ref 4.0–10.5)
nRBC: 0 % (ref 0.0–0.2)

## 2023-07-23 LAB — MAGNESIUM: Magnesium: 1.9 mg/dL (ref 1.7–2.4)

## 2023-07-23 LAB — MRSA NEXT GEN BY PCR, NASAL: MRSA by PCR Next Gen: DETECTED — AB

## 2023-07-23 LAB — PHOSPHORUS: Phosphorus: 3.7 mg/dL (ref 2.5–4.6)

## 2023-07-23 MED ORDER — NALOXONE HCL 0.4 MG/ML IJ SOLN: 0.4000 mg | INTRAMUSCULAR | Status: DC | PRN

## 2023-07-23 MED ORDER — OXYCODONE-ACETAMINOPHEN 5-325 MG PO TABS
2.0000 | ORAL_TABLET | Freq: Once | ORAL | Status: AC | PRN
  Administered 2023-07-25: 2 via ORAL
  Filled 2023-07-23: qty 2

## 2023-07-23 MED ORDER — LABETALOL HCL 5 MG/ML IV SOLN: 10.0000 mg | INTRAVENOUS | Status: DC | PRN

## 2023-07-23 MED ORDER — HYDRALAZINE HCL 50 MG PO TABS
100.0000 mg | ORAL_TABLET | Freq: Three times a day (TID) | ORAL | Status: DC
  Administered 2023-07-23 – 2023-07-25 (×7): 100 mg via ORAL
  Filled 2023-07-23 (×6): qty 2

## 2023-07-23 MED ORDER — HYDROMORPHONE HCL 1 MG/ML IJ SOLN
0.5000 mg | INTRAMUSCULAR | Status: DC | PRN
  Administered 2023-07-23 – 2023-07-25 (×5): 0.5 mg via INTRAVENOUS
  Filled 2023-07-23 (×5): qty 1

## 2023-07-23 MED ORDER — APIXABAN 2.5 MG PO TABS
2.5000 mg | ORAL_TABLET | Freq: Two times a day (BID) | ORAL | Status: DC
  Administered 2023-07-23 – 2023-07-25 (×5): 2.5 mg via ORAL
  Filled 2023-07-23 (×4): qty 1

## 2023-07-23 MED ORDER — PANTOPRAZOLE SODIUM 40 MG PO TBEC
40.0000 mg | DELAYED_RELEASE_TABLET | Freq: Two times a day (BID) | ORAL | Status: DC
  Administered 2023-07-23 – 2023-07-25 (×5): 40 mg via ORAL
  Filled 2023-07-23 (×5): qty 1

## 2023-07-23 NOTE — Progress Notes (Signed)
PROGRESS NOTE    Monique Henderson  XBM:[id number] DOB: 05/14/1951 DOA: 07/22/2023 PCP: Fleet Contras, MD   Brief Narrative:  Monique Henderson is a 73 y.o. 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   Assessment & Plan:   Principal Problem:   Hyperkalemia Active Problems:   Paroxysmal atrial fibrillation (HCC)   Essential hypertension   ESRD (end stage renal disease) (HCC)   History of anemia due to chronic kidney disease   HLD (hyperlipidemia)   Chronic combined systolic and diastolic heart failure (HCC)   Complication of dialysis access insertion   History of COPD  ESRD on HD/hyperkalemia, hyperkalemia of 7.5 upon arrival due to missing hemodialysis.  Seen by nephrology, received dialysis and started on Lokelma.  Potassium normal today.  Concern for infected right-sided tunneled hemodialysis catheter: She was noted to have pus at the outside hemodialysis unit, that was the reason to send her to the ED.  This was noted by the nephrologist here in the hospital as well.  Started on vancomycin, blood cultures drawn.  Patient is afebrile with no leukocytosis.  Cultures negative so far.  On my examination today, she has no warmth, erythema, tenderness or any pus.  Paroxysmal atrial fibrillation: Rates controlled.  She is not on any rate control medications.  Continue Eliquis.  Essential hypertension: Home medications have been resumed which include amlodipine 10 mg, clonidine 0.1 mg 3 times daily, hydralazine 100 mg 3 times  daily.  Blood pressure improving.  Recent blood pressure reading in the chart was taken prior to giving morning meds.  Hyperlipidemia: Continue Crestor.  DVT prophylaxis: apixaban (ELIQUIS) tablet 2.5 mg Start: 07/23/23 0500 SCDs Start: 07/22/23 2011   Code Status: Limited: Do not attempt resuscitation (DNR) -DNR-LIMITED -Do Not Intubate/DNI   Family Communication:  None present at bedside.  Plan of care discussed with patient in length and he/she verbalized understanding and agreed with it.  Status is: Inpatient Remains inpatient appropriate because: Needs final culture reports.      Estimated body mass index is 18.81 kg/m as calculated from the following:   Height as of this encounter: 5\' 3"  (1.6 m).   Weight as of this encounter: 48.2 kg.    Nutritional Assessment: Body mass index is 18.81 kg/m.Marland Kitchen Seen by dietician.  I agree with the assessment and plan as outlined below: Nutrition Status:        . Skin Assessment: I have examined the patient's skin and I agree with the wound assessment as performed by the wound care RN as outlined below:    Consultants:  Nephrology  Procedures:  None  Antimicrobials:  Anti-infectives (From admission, onward)    Start     Dose/Rate Route Frequency Ordered Stop   07/24/23 1800  vancomycin (VANCOREADY) IVPB 500 mg/100 mL        500 mg 100 mL/hr over 60 Minutes Intravenous Every T-Th-Sa (Hemodialysis) 07/22/23 2126     07/22/23 2130  vancomycin (VANCOCIN) IVPB 1000 mg/200 mL premix        1,000 mg 200 mL/hr over 60 Minutes Intravenous  Once 07/22/23 2126 07/23/23 0272  Subjective: Patient seen and examined.  She has no complaints at all.  Objective: Vitals:   07/23/23 0400 07/23/23 0428 07/23/23 0617 07/23/23 0736  BP: (!) 193/108  (!) 171/97 (!) 170/99  Pulse: 98  79 76  Resp: 20   20  Temp: 98.3 F (36.8 C)   98.9 F (37.2 C)  TempSrc: Oral     SpO2: 93%  (!) 85%   Weight:  48.2 kg    Height:  5\' 3"  (1.6  m)      Intake/Output Summary (Last 24 hours) at 07/23/2023 1110 Last data filed at 07/23/2023 1610 Gross per 24 hour  Intake 477.89 ml  Output 1500 ml  Net -1022.11 ml   Filed Weights   07/23/23 0428  Weight: 48.2 kg    Examination:  General exam: Appears calm and comfortable  Respiratory system: Clear to auscultation. Respiratory effort normal. Cardiovascular system: S1 & S2 heard, RRR. No JVD, murmurs, rubs, gallops or clicks. No pedal edema. Gastrointestinal system: Abdomen is nondistended, soft and nontender. No organomegaly or masses felt. Normal bowel sounds heard. Central nervous system: Alert and oriented. No focal neurological deficits. Extremities: Symmetric 5 x 5 power. Skin: No rashes, lesions or ulcers Psychiatry: Judgement and insight appear normal. Mood & affect appropriate.    Data Reviewed: I have personally reviewed following labs and imaging studies  CBC: Recent Labs  Lab 07/22/23 1553 07/23/23 0418  WBC 6.0 5.4  NEUTROABS 4.0 3.7  HGB 9.5* 9.4*  HCT 30.3* 29.9*  MCV 94.7 93.1  PLT 205 192   Basic Metabolic Panel: Recent Labs  Lab 07/22/23 1553 07/23/23 0418  NA 140 137  K >7.5* 4.5  CL 99 95*  CO2 25 29  GLUCOSE 103* 90  BUN 74* 22  CREATININE 14.08* 6.04*  CALCIUM 9.0 9.0  MG 2.7* 1.9  PHOS  --  3.7   GFR: Estimated Creatinine Clearance: 6.6 mL/min (A) (by C-G formula based on SCr of 6.04 mg/dL (H)). Liver Function Tests: Recent Labs  Lab 07/22/23 1553 07/23/23 0418  AST 16 15  ALT 9 9  ALKPHOS 62 52  BILITOT 0.6 0.7  PROT 7.0 6.6  ALBUMIN 3.5 3.3*   No results for input(s): "LIPASE", "AMYLASE" in the last 168 hours. No results for input(s): "AMMONIA" in the last 168 hours. Coagulation Profile: No results for input(s): "INR", "PROTIME" in the last 168 hours. Cardiac Enzymes: No results for input(s): "CKTOTAL", "CKMB", "CKMBINDEX", "TROPONINI" in the last 168 hours. BNP (last 3 results) No results for input(s): "PROBNP"  in the last 8760 hours. HbA1C: No results for input(s): "HGBA1C" in the last 72 hours. CBG: No results for input(s): "GLUCAP" in the last 168 hours. Lipid Profile: No results for input(s): "CHOL", "HDL", "LDLCALC", "TRIG", "CHOLHDL", "LDLDIRECT" in the last 72 hours. Thyroid Function Tests: No results for input(s): "TSH", "T4TOTAL", "FREET4", "T3FREE", "THYROIDAB" in the last 72 hours. Anemia Panel: No results for input(s): "VITAMINB12", "FOLATE", "FERRITIN", "TIBC", "IRON", "RETICCTPCT" in the last 72 hours. Sepsis Labs: No results for input(s): "PROCALCITON", "LATICACIDVEN" in the last 168 hours.  Recent Results (from the past 240 hours)  Blood culture (routine x 2)     Status: None (Preliminary result)   Collection Time: 07/22/23  3:53 PM   Specimen: BLOOD  Result Value Ref Range Status   Specimen Description BLOOD SITE NOT SPECIFIED  Final   Special Requests   Final    BOTTLES DRAWN AEROBIC AND ANAEROBIC Blood Culture results may not be optimal  due to an inadequate volume of blood received in culture bottles   Culture   Final    NO GROWTH < 24 HOURS Performed at [facility], [address], [place], [place] [address]    Report Status PENDING  Incomplete  Blood culture (routine x 2)     Status: None (Preliminary result)   Collection Time: 07/22/23  3:58 PM   Specimen: BLOOD  Result Value Ref Range Status   Specimen Description BLOOD SITE NOT SPECIFIED  Final   Special Requests   Final    BOTTLES DRAWN AEROBIC AND ANAEROBIC Blood Culture results may not be optimal due to an inadequate volume of blood received in culture bottles   Culture   Final    NO GROWTH < 12 HOURS Performed at [facility], [address] [place], [place] [address]    Report Status PENDING  Incomplete  MRSA Next Gen by PCR, Nasal     Status: Abnormal   Collection Time: 07/23/23  5:08 AM   Specimen: Nasal Mucosa; Nasal Swab  Result Value Ref Range Status   MRSA by PCR Next Gen DETECTED  (A) NOT DETECTED Final    Comment: RESULT CALLED TO, READ BACK BY AND VERIFIED WITH: RN KAYLA on [id number] @[phone]  by SM (NOTE) The GeneXpert MRSA Assay (FDA approved for NASAL specimens only), is one component of a comprehensive MRSA colonization surveillance program. It is not intended to diagnose MRSA infection nor to guide or monitor treatment for MRSA infections. Test performance is not FDA approved in patients less than 44 years old. Performed at [facility], [address] [place], [place] [address]      Radiology Studies: DG Chest Portable 1 View Result Date: 07/22/2023 CLINICAL DATA:  c/f infected permacath site. EXAM: PORTABLE CHEST 1 VIEW COMPARISON:  06/17/2023 FINDINGS: Low lung volume. There are mildly increased interstitial markings, less pronounced than the prior exam. There patchy atelectatic changes/scarring at the left lung base. Bilateral lung fields are otherwise clear. Bilateral costophrenic angles are clear. Note is made of elevated right hemidiaphragm. Stable cardio-mediastinal silhouette. Sternotomy wires noted. No acute osseous abnormalities. The soft tissues are within normal limits. Right IJ central venous catheter noted with its tip overlying the right atrium. IMPRESSION: *Mildly increased interstitial markings, less pronounced than the prior exam. No acute cardiopulmonary abnormality. Electronically Signed   By: Jules Schick M.D.   On: 07/22/2023 16:14    Scheduled Meds:  amLODipine  10 mg Oral Daily   apixaban  2.5 mg Oral BID   cloNIDine  0.1 mg Oral TID   darbepoetin (ARANESP) injection - DIALYSIS  150 mcg Subcutaneous Q Fri-1800   hydrALAZINE  100 mg Oral Q8H   pantoprazole  40 mg Oral BID   rosuvastatin  10 mg Oral Daily   sevelamer carbonate  2.4 g Oral TID WC   Continuous Infusions:  [START ON 07/24/2023] vancomycin       LOS: 1 day   Hughie Closs, MD [facility]  07/23/2023, 11:10 AM   *Please note that this is a verbal dictation  therefore any spelling or grammatical errors are due to the "Dragon Medical One" system interpretation.  Please page via Amion and do not message via secure chat for urgent patient care matters. Secure chat can be used for non urgent patient care matters.  How to contact the [facility] Attending or Consulting provider 7A - 7P or covering provider during after hours 7P -7A, for this  patient?  Check the care team in [facility] and look for a) attending/consulting TRH provider listed and b) the [facility] team listed. Page or secure chat 7A-7P. Log into [email or web address] and use [facility] universal password to access. If you do not have the password, please contact the hospital operator. Locate the [facility] provider you are looking for under [facility] and page to a number that you can be directly reached. If you still have difficulty reaching the provider, please page the [facility] (Director on Call) for the Hospitalists listed on amion for assistance.

## 2023-07-23 NOTE — Progress Notes (Signed)
[facility] Progress Note  Subjective:    Vitals:   07/23/23 0428 07/23/23 0617 07/23/23 0736 07/23/23 1212  BP:  (!) 171/97 (!) 170/99 (!) 153/84  Pulse:  79 76 84  Resp:   20 16  Temp:   98.9 F (37.2 C) 98.5 F (36.9 C)  TempSrc:    Oral  SpO2:  (!) 85%    Weight: 48.2 kg     Height: 5\' 3"  (1.6 m)       Exam: General: adult female in bed in NAD  HEENT: NCAT Eyes: EOMI sclera anicteric Neck: supple trachea midline  Heart: S1S2 no rub Lungs: clear to auscultation and unlabored at rest on 3 liters oxygen Abdomen: soft/nt/nd  Extremities: trace to 1+ edema lower extremities  Skin: as below skin tear and erythema near catheter; no other lesion or rash on extremities exposed Neuro: alert and oriented x 3; dysarthria  Psych normal mood and affect Access:  RIJ tunneled catheter in place with erythema surrounding insertion site as well as skin tear   OP HD:  SW TTS 3.5h  B400   41kg  2Kbath  TDC  Heparin none - rising post wt's every day for the last 3 wks -  last HD 2/15 came off 5.5kg over - idwg's are not large at 1-2kg but UF is typically low 1.3- 1.7 L - sensipar 60 mg three times a week with HD - hectorol 9 mcg three times a week with HD - mircera 225 mcg every 2 weeks, last 2/4 at 100 mcg dose - venofer 50 mg weekly     Assessment/Plan:   # ESRD - got HD last night. Next HD Sat.    # Hyperkalemia - K+ was 7.5, now down to 4.5 after HD/ temp measures overnight. Much better. Renal diet.    # ID - possible TDC exit site infection and /or blood infection. BCx's pending. Getting empiric abx w/ IV Vancomycin. Exit site today is not draining.   # Volume - is up 7kg by wts after overnight HD. Plan large UF w/ HD Sat   # HTN  - optimize UF with HD. Continue home meds   # Secondary hyperparathyroidism - cont binders w/ diet. CCa and phos in range. Renal diet. To avoid duplication, please do not list sensipar or hectorol on her home medication list since  they are just given at outpt HD   # Anemia of CKD  - note that her ESA is due; have ordered darbe 150 mcg every Friday      Rob Kalijah Westfall MD  [facility] 07/23/2023, 1:10 PM  Recent Labs  Lab 07/22/23 1553 07/23/23 0418  HGB 9.5* 9.4*  ALBUMIN 3.5 3.3*  CALCIUM 9.0 9.0  PHOS  --  3.7  CREATININE 14.08* 6.04*  K >7.5* 4.5   No results for input(s): "IRON", "TIBC", "FERRITIN" in the last 168 hours. Inpatient medications:  amLODipine  10 mg Oral Daily   apixaban  2.5 mg Oral BID   cloNIDine  0.1 mg Oral TID   darbepoetin (ARANESP) injection - DIALYSIS  150 mcg Subcutaneous Q Fri-1800   hydrALAZINE  100 mg Oral Q8H   pantoprazole  40 mg Oral BID   rosuvastatin  10 mg Oral Daily   sevelamer carbonate  2.4 g Oral TID WC    [START ON 07/24/2023] vancomycin     acetaminophen **OR** acetaminophen, HYDROmorphone (DILAUDID) injection, labetalol, melatonin, naLOXone (NARCAN)  injection, oxyCODONE-acetaminophen

## 2023-07-23 NOTE — Progress Notes (Signed)
Pt arrived to unit with 10/10 pain at her HD site, yellow drainage assessed at site, gauze dressing changed, BP was 193/108 MAP 135. Attempted to notify Howerter, MD; see new orders.   @ [phone] noticed that pt's Qtc was prolonged on telemetry monitor & previous EKG. Notified Howerter, MD. See new orders.  Bari Edward, RN

## 2023-07-23 NOTE — TOC Initial Note (Addendum)
Transition of Care [facility]) - Initial/Assessment Note    Patient Details  Name: Monique Henderson MRN: [id number] Date of Birth: Aug 23, 1950  Transition of [facility]) CM/SW Contact:    Gala Lewandowsky, RN Phone Number: 07/23/2023, 12:28 PM  Clinical Narrative: Risk for readmission assessment completed. Patient presented for hyperkalemia-Hx ESRD TTS. PTA patient states she is from home with her boyfriend. Patient has transportation via [employer] [place]. Patient states she has PCP and gets to appointments without any issues. Case Manager will continue to follow for additional needs as the patient progresses.                     Expected Discharge Plan: Home/Self Care Barriers to Discharge: No Barriers Identified   Patient Goals and CMS Choice Patient states their goals for this hospitalization and ongoing recovery are:: to return home with boyfriend once stable   Choice offered to / list presented to : NA      Expected Discharge Plan and Services In-house Referral: NA Discharge Planning Services: CM Consult Post Acute Care Choice: NA Living arrangements for the past 2 months: Apartment                   DME Agency: NA       HH Arranged: NA  Prior Living Arrangements/Services Living arrangements for the past 2 months: Apartment Lives with:: Significant Other Patient language and need for interpreter reviewed:: Yes Do you feel safe going back to the place where you live?: Yes      Need for Family Participation in Patient Care: Yes (Comment) Care giver support system in place?: Yes (comment) Current home services: DME (walker) Criminal Activity/Legal Involvement Pertinent to Current Situation/Hospitalization: No - Comment as needed  Activities of Daily Living   ADL Screening (condition at time of admission) Independently performs ADLs?: Yes (appropriate for developmental age) Is the patient deaf or have difficulty hearing?: No Does the patient have difficulty seeing,  even when wearing glasses/contacts?: No Does the patient have difficulty concentrating, remembering, or making decisions?: No  Permission Sought/Granted Permission sought to share information with : Family Supports, Case Manager                Emotional Assessment Appearance:: Appears stated age Attitude/Demeanor/Rapport: Engaged Affect (typically observed): Appropriate Orientation: : Oriented to Self, Oriented to Place, Oriented to  Time, Oriented to Situation Alcohol / Substance Use: Not Applicable Psych Involvement: No (comment)  Admission diagnosis:  Hyperkalemia [E87.5] Complication of dialysis access insertion, initial encounter [T82.9XXA] Patient Active Problem List   Diagnosis Date Noted   Complication of dialysis access insertion 07/22/2023   History of COPD 07/22/2023   Atrial fibrillation with RVR (HCC) 06/26/2023   Acute on chronic respiratory failure with hypoxia (HCC) 06/17/2023   Paroxysmal atrial flutter (HCC) 06/05/2023   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   Elevated troponin level not due to acute coronary syndrome 06/05/2023   History of aortic dissection 06/05/2023   History of cocaine abuse (HCC) 06/05/2023   Noncompliance with medication regimen 06/05/2023   End stage renal disease (HCC) 06/05/2023   Chronic heart failure with mildly reduced ejection fraction (HFmrEF, 41-49%) (HCC) 06/05/2023   Long term (current) use of anticoagulants 06/05/2023   Acute hypoxic respiratory failure (HCC) 06/02/2023   Acute respiratory failure with hypoxia (HCC) 05/07/2023   Acute hypoxemic respiratory failure (HCC) 05/06/2023   Advance care planning 01/15/2023   Hypercoagulable state due to paroxysmal atrial fibrillation (HCC) 10/21/2022   Right  shoulder pain 06/17/2022   Edema of right upper arm 06/17/2022   Hemiparesis affecting right side as late effect of cerebrovascular accident (CVA) (HCC) 04/06/2022   Hypervolemia associated with renal insufficiency  01/29/2022   HLD (hyperlipidemia) 01/29/2022   Polysubstance abuse (HCC) 01/06/2022   Acute on chronic combined systolic and diastolic CHF (congestive heart failure) (HCC) 01/06/2022   History of hepatitis C 12/08/2021   Hypertensive urgency 10/29/2021   Fluid overload 10/29/2021   Acute on chronic HFrEF (heart failure with reduced ejection fraction) (HCC) - BNP does NOT correlate to her level of volume overload 10/29/2021   Chronic anticoagulation 10/29/2021   Prolonged QT interval 10/29/2021   Hypertensive emergency 08/23/2021   History of anemia due to chronic kidney disease 06/05/2021   Chronic combined systolic and diastolic heart failure (HCC) 04/30/2021   Pure hypercholesterolemia 02/14/2021   Metabolic acidosis with increased anion gap and accumulation of organic acids 09/21/2020   Hypertension 09/21/2020   Hyperkalemia 08/28/2019   Paroxysmal SVT (supraventricular tachycardia) (HCC) 08/27/2019   Aortic atherosclerosis (HCC) 07/05/2019   History of CVA (cerebrovascular accident) 05/29/2019   Paroxysmal atrial fibrillation (HCC) 05/29/2019   ESRD (end stage renal disease) (HCC) 05/29/2019   Malnutrition of moderate degree 05/19/2019   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 04/07/2019   Non compliance with medical treatment 12/04/2017   Chronic obstructive lung disease (HCC) 01/16/2017   Chronic low back pain 06/22/2016   Insomnia 03/14/2015   S/P lumbar spinal fusion 01/18/2014   Tobacco use disorder 04/19/2009   Essential hypertension 09/16/2006   GERD 09/16/2006   PCP:  Fleet Contras, MD Pharmacy:   [facility] - [place], [place] - [address] [place] [address] Phone: [phone] Fax: [phone]  Social Drivers of Health (SDOH) Social History: SDOH Screenings   Food Insecurity: No Food Insecurity (07/23/2023)  Housing: Low Risk  (07/23/2023)  Transportation Needs: No Transportation Needs (07/23/2023)  Utilities: Not  At Risk (07/23/2023)  Depression (PHQ2-9): Low Risk  (06/17/2022)  Recent Concern: Depression (PHQ2-9) - Medium Risk (05/22/2022)  Financial Resource Strain: Low Risk  (04/14/2021)   Received from [facility]  Physical Activity: Inactive (04/14/2021)   Received from [facility], [facility]  Social Connections: Socially Isolated (07/23/2023)  Stress: Stress Concern Present (04/14/2021)   Received from [facility], [facility]  Tobacco Use: Medium Risk (07/22/2023)   Readmission Risk Interventions    07/23/2023   12:26 PM 06/03/2023    3:11 PM 01/15/2023   12:35 PM  Readmission Risk Prevention Plan  Transportation Screening Complete Complete Complete  Medication Review (RN Care Manager) Referral to Pharmacy Referral to Pharmacy Referral to Pharmacy  PCP or Specialist appointment within 3-5 days of discharge  Complete Complete  HRI or Home Care Consult Complete Complete Complete  SW Recovery Care/Counseling Consult Complete Complete Complete  Palliative Care Screening Not Applicable Not Applicable Not Applicable  Skilled Nursing Facility Not Applicable Not Applicable Not Applicable

## 2023-07-24 DIAGNOSIS — E875 Hyperkalemia: Secondary | ICD-10-CM | POA: Diagnosis not present

## 2023-07-24 LAB — HEPATITIS B SURFACE ANTIBODY, QUANTITATIVE: Hep B S AB Quant (Post): 51.6 m[IU]/mL

## 2023-07-24 MED ORDER — HEPARIN SODIUM (PORCINE) 1000 UNIT/ML DIALYSIS: 20.0000 [IU]/kg | INTRAMUSCULAR | Status: DC | PRN

## 2023-07-24 MED ORDER — ANTICOAGULANT SODIUM CITRATE 4% (200MG/5ML) IV SOLN: 5.0000 mL | Status: DC | PRN

## 2023-07-24 MED ORDER — LIDOCAINE HCL (PF) 1 % IJ SOLN: 5.0000 mL | INTRAMUSCULAR | Status: DC | PRN

## 2023-07-24 MED ORDER — HEPARIN SODIUM (PORCINE) 1000 UNIT/ML DIALYSIS: 1000.0000 [IU] | INTRAMUSCULAR | Status: DC | PRN

## 2023-07-24 MED ORDER — CAMPHOR-MENTHOL 0.5-0.5 % EX LOTN
TOPICAL_LOTION | CUTANEOUS | Status: DC | PRN
  Filled 2023-07-24: qty 222

## 2023-07-24 MED ORDER — LIDOCAINE-PRILOCAINE 2.5-2.5 % EX CREA: 1.0000 | TOPICAL_CREAM | CUTANEOUS | Status: DC | PRN

## 2023-07-24 MED ORDER — PENTAFLUOROPROP-TETRAFLUOROETH EX AERO: 1.0000 | INHALATION_SPRAY | CUTANEOUS | Status: DC | PRN

## 2023-07-24 MED ORDER — ALTEPLASE 2 MG IJ SOLR: 2.0000 mg | Freq: Once | INTRAMUSCULAR | Status: DC | PRN

## 2023-07-24 MED ORDER — PENTAFLUOROPROP-TETRAFLUOROETH EX AERO
1.0000 | INHALATION_SPRAY | CUTANEOUS | Status: DC | PRN
Start: 2023-07-24 — End: 2023-07-24

## 2023-07-24 MED ORDER — NEPRO/CARBSTEADY PO LIQD: 237.0000 mL | ORAL | Status: DC | PRN

## 2023-07-24 NOTE — Progress Notes (Signed)
   07/24/23 1208  Vitals  BP (!) 196/94  MAP (mmHg) 124  BP Location Left Arm  BP Method Automatic  Patient Position (if appropriate) Lying  ECG Heart Rate 84  Level of Consciousness  Level of Consciousness Alert   Received back from HD. Due morning BP meds givem

## 2023-07-24 NOTE — Progress Notes (Signed)
   07/24/23 1129  Vitals  Temp 98.1 F (36.7 C)  Pulse Rate 81  Resp 15  BP (!) 185/107  SpO2 100 %  O2 Device Room Air  Weight 47.4 kg (Taken via bed.)  Type of Weight Post-Dialysis  Oxygen Therapy  Patient Activity (if Appropriate) In bed  Pulse Oximetry Type Continuous  Oximetry Probe Site Changed No  Post Treatment  Dialyzer Clearance Clear  Liters Processed 78  Fluid Removed (mL) 3 mL  Tolerated HD Treatment Yes  AVG/AVF Arterial Site Held (minutes) 0 minutes  AVG/AVF Venous Site Held (minutes) 0 minutes   Received patient in bed to unit.  Alert and oriented.  Informed consent signed and in chart.   TX duration:3.25hrs  Patient tolerated well.  Transported back to the room  Alert, without acute distress.  Hand-off given to patient's nurse.   Access used: [facility] Access issues: none  Total UF removed: 3L Medication(s) given: Vanc    [facility]

## 2023-07-24 NOTE — Progress Notes (Signed)
 PROGRESS NOTE    SANTINA TRILLO  WGN:[id number] DOB: December 05, 1950 DOA: 07/22/2023 PCP: Fleet Contras, MD   Brief Narrative:  Monique Henderson is a 73 y.o. 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  Per report, the tech at dialysis was able to express some pus from this site.  She did not get hemodialysis there today as a result.  The dialysis catheter was placed on 05/28/23 by Dr. Juel Burrow.  Nephrology consulted.  Assessment & Plan:   Principal Problem:   Hyperkalemia Active Problems:   Paroxysmal atrial fibrillation (HCC)   Essential hypertension   ESRD (end stage renal disease) (HCC)   History of anemia due to chronic kidney disease   HLD (hyperlipidemia)   Chronic combined systolic and diastolic heart failure (HCC)   Complication of dialysis access insertion   History of COPD  ESRD on HD/hyperkalemia, hyperkalemia of 7.5 upon arrival due to missing hemodialysis.  Seen by nephrology, received dialysis urgently and started on Lokelma.  Potassium normalized.  Another HD today.  Concern for infected right-sided tunneled hemodialysis catheter: She was noted to have pus at the outside hemodialysis unit, that was the reason to send her to the ED. per initial consultation report, past was noted by the nephrologist here in the hospital as well.  Started on vancomycin, blood cultures drawn.  Patient is afebrile with no leukocytosis.  Cultures negative so far.  On my examination yesterday  and today, she has no warmth, erythema, tenderness or any pus.  Will continue antibiotics and await for blood culture for total of 72 hours at least which will be tomorrow.  Paroxysmal atrial fibrillation: Rates controlled.  She is not on any rate control medications.  Continue Eliquis.  Essential hypertension: Home medications have been resumed which include amlodipine 10 mg, clonidine 0.1 mg 3 times daily, hydralazine 100 mg 3 times daily.  Blood pressure still elevated but I expect further improvement as we continue dialysis as this is exactly what happened during her recent hospitalization.  Hyperlipidemia: Continue Crestor.  DVT prophylaxis: apixaban (ELIQUIS) tablet 2.5 mg Start: 07/23/23 0500 SCDs Start: 07/22/23 2011   Code Status: Limited: Do not attempt resuscitation (DNR) -DNR-LIMITED -Do Not Intubate/DNI   Family Communication:  None present at bedside.  Plan of care discussed with patient in length and he/she verbalized understanding and agreed with it.  Status is: Inpatient Remains inpatient appropriate because: Needs final culture reports.      Estimated body mass index is 19.57 kg/m as calculated from the following:   Height as of this encounter: 5\' 3"  (1.6 m).   Weight as of this encounter: 50.1 kg.    Nutritional Assessment: Body mass index is 19.57 kg/m.Marland Kitchen Seen by dietician.  I agree with the assessment and plan as outlined below: Nutrition Status:        . Skin Assessment: I have examined the patient's skin and I agree with the wound assessment as performed by the wound care RN as outlined below:    Consultants:  Nephrology  Procedures:  None  Antimicrobials:  Anti-infectives (From admission, onward)    Start  Dose/Rate Route Frequency Ordered Stop   07/24/23 1800  vancomycin (VANCOREADY) IVPB 500 mg/100 mL        500 mg 100 mL/hr over 60 Minutes Intravenous Every T-Th-Sa (Hemodialysis) 07/22/23 2126     07/22/23 2130  vancomycin (VANCOCIN)  IVPB 1000 mg/200 mL premix        1,000 mg 200 mL/hr over 60 Minutes Intravenous  Once 07/22/23 2126 07/23/23 1610         Subjective: Patient seen and examined in the dialysis unit.  She has no complaints.  She requested discharge after dialysis.  I counseled her that we need to keep her in the hospital until blood cultures negative for at least 72 hours which will be by tomorrow.  Objective: Vitals:   07/24/23 0434 07/24/23 0511 07/24/23 0526 07/24/23 0753  BP: (!) 163/95 (!) 163/95  (!) 167/83  Pulse: 79   80  Resp: 19   15  Temp: 98.2 F (36.8 C)   98.1 F (36.7 C)  TempSrc: Oral     SpO2: 98%   95%  Weight:   48.7 kg 50.1 kg  Height:        Intake/Output Summary (Last 24 hours) at 07/24/2023 0756 Last data filed at 07/23/2023 1310 Gross per 24 hour  Intake 240 ml  Output --  Net 240 ml   Filed Weights   07/23/23 0428 07/24/23 0526 07/24/23 0753  Weight: 48.2 kg 48.7 kg 50.1 kg    Examination:  General exam: Appears calm and comfortable  Respiratory system: Clear to auscultation. Respiratory effort normal. Cardiovascular system: S1 & S2 heard, RRR. No JVD, murmurs, rubs, gallops or clicks. No pedal edema. Gastrointestinal system: Abdomen is nondistended, soft and nontender. No organomegaly or masses felt. Normal bowel sounds heard. Central nervous system: Alert and oriented. No focal neurological deficits. Extremities: Symmetric 5 x 5 power. Skin: No rashes, lesions or ulcers.  Psychiatry: Judgement and insight appear normal. Mood & affect appropriate.   Data Reviewed: I have personally reviewed following labs and imaging studies  CBC: Recent Labs  Lab 07/22/23 1553 07/23/23 0418  WBC 6.0 5.4  NEUTROABS 4.0 3.7  HGB 9.5* 9.4*  HCT 30.3* 29.9*  MCV 94.7 93.1  PLT 205 192   Basic Metabolic Panel: Recent Labs  Lab 07/22/23 1553 07/23/23 0418  NA 140 137  K >7.5* 4.5  CL 99 95*  CO2 25 29  GLUCOSE 103* 90  BUN 74* 22  CREATININE 14.08* 6.04*   CALCIUM 9.0 9.0  MG 2.7* 1.9  PHOS  --  3.7   GFR: Estimated Creatinine Clearance: 6.9 mL/min (A) (by C-G formula based on SCr of 6.04 mg/dL (H)). Liver Function Tests: Recent Labs  Lab 07/22/23 1553 07/23/23 0418  AST 16 15  ALT 9 9  ALKPHOS 62 52  BILITOT 0.6 0.7  PROT 7.0 6.6  ALBUMIN 3.5 3.3*   No results for input(s): "LIPASE", "AMYLASE" in the last 168 hours. No results for input(s): "AMMONIA" in the last 168 hours. Coagulation Profile: No results for input(s): "INR", "PROTIME" in the last 168 hours. Cardiac Enzymes: No results for input(s): "CKTOTAL", "CKMB", "CKMBINDEX", "TROPONINI" in the last 168 hours. BNP (last 3 results) No results for input(s): "PROBNP" in the last 8760 hours. HbA1C: No results for input(s): "HGBA1C" in the last 72 hours. CBG: No results for input(s): "GLUCAP" in the last 168 hours. Lipid Profile: No results for input(s): "CHOL", "HDL", "LDLCALC", "TRIG", "CHOLHDL", "LDLDIRECT" in the last 72 hours. Thyroid  Function Tests: No results for input(s): "TSH", "T4TOTAL", "FREET4", "T3FREE", "THYROIDAB" in the last 72 hours. Anemia Panel: No results for input(s): "VITAMINB12", "FOLATE", "FERRITIN", "TIBC", "IRON", "RETICCTPCT" in the last 72 hours. Sepsis Labs: No results for input(s): "PROCALCITON", "LATICACIDVEN" in the last 168 hours.  Recent Results (from the past 240 hours)  Blood culture (routine x 2)     Status: None (Preliminary result)   Collection Time: 07/22/23  3:53 PM   Specimen: BLOOD  Result Value Ref Range Status   Specimen Description BLOOD SITE NOT SPECIFIED  Final   Special Requests   Final    BOTTLES DRAWN AEROBIC AND ANAEROBIC Blood Culture results may not be optimal due to an inadequate volume of blood received in culture bottles   Culture   Final    NO GROWTH < 24 HOURS Performed at [facility], [address] [place], [place] [address]    Report Status PENDING  Incomplete  Blood culture (routine x 2)      Status: None (Preliminary result)   Collection Time: 07/22/23  3:58 PM   Specimen: BLOOD  Result Value Ref Range Status   Specimen Description BLOOD SITE NOT SPECIFIED  Final   Special Requests   Final    BOTTLES DRAWN AEROBIC AND ANAEROBIC Blood Culture results may not be optimal due to an inadequate volume of blood received in culture bottles   Culture   Final    NO GROWTH < 12 HOURS Performed at [facility], [address] [place], [place] [address]    Report Status PENDING  Incomplete  MRSA Next Gen by PCR, Nasal     Status: Abnormal   Collection Time: 07/23/23  5:08 AM   Specimen: Nasal Mucosa; Nasal Swab  Result Value Ref Range Status   MRSA by PCR Next Gen DETECTED (A) NOT DETECTED Final    Comment: RESULT CALLED TO, READ BACK BY AND VERIFIED WITH: RN KAYLA on [id number] @[phone]  by SM (NOTE) The GeneXpert MRSA Assay (FDA approved for NASAL specimens only), is one component of a comprehensive MRSA colonization surveillance program. It is not intended to diagnose MRSA infection nor to guide or monitor treatment for MRSA infections. Test performance is not FDA approved in patients less than 39 years old. Performed at [facility], [address] [place], [place] [address]      Radiology Studies: DG Chest Portable 1 View Result Date: 07/22/2023 CLINICAL DATA:  c/f infected permacath site. EXAM: PORTABLE CHEST 1 VIEW COMPARISON:  06/17/2023 FINDINGS: Low lung volume. There are mildly increased interstitial markings, less pronounced than the prior exam. There patchy atelectatic changes/scarring at the left lung base. Bilateral lung fields are otherwise clear. Bilateral costophrenic angles are clear. Note is made of elevated right hemidiaphragm. Stable cardio-mediastinal silhouette. Sternotomy wires noted. No acute osseous abnormalities. The soft tissues are within normal limits. Right IJ central venous catheter noted with its tip overlying the right atrium. IMPRESSION:  *Mildly increased interstitial markings, less pronounced than the prior exam. No acute cardiopulmonary abnormality. Electronically Signed   By: Jules Schick M.D.   On: 07/22/2023 16:14    Scheduled Meds:  amLODipine  10 mg Oral Daily   apixaban  2.5 mg Oral BID   cloNIDine  0.1 mg Oral TID   darbepoetin (ARANESP) injection - DIALYSIS  150 mcg Subcutaneous Q Fri-1800   hydrALAZINE  100 mg Oral Q8H   pantoprazole  40 mg Oral BID   rosuvastatin  10  mg Oral Daily   sevelamer carbonate  2.4 g Oral TID WC   Continuous Infusions:  vancomycin       LOS: 2 days   Hughie Closs, MD [facility]  07/24/2023, 7:56 AM   *Please note that this is a verbal dictation therefore any spelling or grammatical errors are due to the "Dragon Medical One" system interpretation.  Please page via Amion and do not message via secure chat for urgent patient care matters. Secure chat can be used for non urgent patient care matters.  How to contact the [facility] Attending or Consulting provider 7A - 7P or covering provider during after hours 7P -7A, for this patient?  Check the care team in [facility] and look for a) attending/consulting [facility] provider listed and b) the [facility] team listed. Page or secure chat 7A-7P. Log into [email or web address] and use [facility]'s universal password to access. If you do not have the password, please contact the hospital operator. Locate the [facility] provider you are looking for under [facility] and page to a number that you can be directly reached. If you still have difficulty reaching the provider, please page the [facility] (Director on Call) for the Hospitalists listed on amion for assistance.

## 2023-07-24 NOTE — Progress Notes (Addendum)
 [facility] Progress Note   Subjective:    Seen and examined patient on HD. Tolerating UFG 3L. Denies SOB and CP. TDC dressing clean, dry, and intact. Blood cultures 2/20 NGTD. Should be able to go home after dc today.  Objective Vitals:   07/24/23 1124 07/24/23 1129 07/24/23 1208 07/24/23 1304  BP: (!) 193/100 (!) 185/107 (!) 196/94 (!) 181/93  Pulse: 83 81  70  Resp: (!) 24 15  16   Temp:  98.1 F (36.7 C)  98.6 F (37 C)  TempSrc:    Oral  SpO2: 96% 100%  100%  Weight:  47.4 kg    Height:       Physical Exam General: Chronically ill-appearing; NAD Heart: S1 and S2; No murmurs, gallops, or rubs Lungs: Clear throughout; No rales, wheezing, or rhonchi Abdomen: Soft and non-tender Extremities: No LE edema Dialysis Access: R TDC, dressing is clean, dry, and intact   Filed Weights   07/24/23 0753 07/24/23 1000 07/24/23 1129  Weight: 50.1 kg 49.9 kg 47.4 kg    Intake/Output Summary (Last 24 hours) at 07/24/2023 1400 Last data filed at 07/24/2023 1226 Gross per 24 hour  Intake 420 ml  Output 3 ml  Net 417 ml    Additional Objective Labs: Basic Metabolic Panel: Recent Labs  Lab 07/22/23 1553 07/23/23 0418  NA 140 137  K >7.5* 4.5  CL 99 95*  CO2 25 29  GLUCOSE 103* 90  BUN 74* 22  CREATININE 14.08* 6.04*  CALCIUM 9.0 9.0  PHOS  --  3.7   Liver Function Tests: Recent Labs  Lab 07/22/23 1553 07/23/23 0418  AST 16 15  ALT 9 9  ALKPHOS 62 52  BILITOT 0.6 0.7  PROT 7.0 6.6  ALBUMIN 3.5 3.3*   No results for input(s): "LIPASE", "AMYLASE" in the last 168 hours. CBC: Recent Labs  Lab 07/22/23 1553 07/23/23 0418  WBC 6.0 5.4  NEUTROABS 4.0 3.7  HGB 9.5* 9.4*  HCT 30.3* 29.9*  MCV 94.7 93.1  PLT 205 192   Blood Culture    Component Value Date/Time   SDES BLOOD SITE NOT SPECIFIED 07/22/2023 1558   SPECREQUEST  07/22/2023 1558    BOTTLES DRAWN AEROBIC AND ANAEROBIC Blood Culture results may not be optimal due to an inadequate volume  of blood received in culture bottles   CULT  07/22/2023 1558    NO GROWTH 2 DAYS Performed at [facility], [address], [place], [place] [address]    REPTSTATUS PENDING 07/22/2023 1558    Cardiac Enzymes: No results for input(s): "CKTOTAL", "CKMB", "CKMBINDEX", "TROPONINI" in the last 168 hours. CBG: No results for input(s): "GLUCAP" in the last 168 hours. Iron Studies: No results for input(s): "IRON", "TIBC", "TRANSFERRIN", "FERRITIN" in the last 72 hours. Lab Results  Component Value Date   INR 1.1 06/25/2023   INR 1.1 06/02/2023   INR 1.2 09/25/2022   Studies/Results: DG Chest Portable 1 View Result Date: 07/22/2023 CLINICAL DATA:  c/f infected permacath site. EXAM: PORTABLE CHEST 1 VIEW COMPARISON:  06/17/2023 FINDINGS: Low lung volume. There are mildly increased interstitial markings, less pronounced than the prior exam. There patchy atelectatic changes/scarring at the left lung base. Bilateral lung fields are otherwise clear. Bilateral costophrenic angles are clear. Note is made of elevated right hemidiaphragm. Stable cardio-mediastinal silhouette. Sternotomy wires noted. No acute osseous abnormalities. The soft tissues are within normal limits. Right IJ central venous catheter noted with its tip overlying the right atrium. IMPRESSION: *  Mildly increased interstitial markings, less pronounced than the prior exam. No acute cardiopulmonary abnormality. Electronically Signed   By: Jules Schick M.D.   On: 07/22/2023 16:14    Medications:  vancomycin Stopped (07/24/23 1204)    amLODipine  10 mg Oral Daily   apixaban  2.5 mg Oral BID   cloNIDine  0.1 mg Oral TID   darbepoetin (ARANESP) injection - DIALYSIS  150 mcg Subcutaneous Q Fri-1800   hydrALAZINE  100 mg Oral Q8H   pantoprazole  40 mg Oral BID   rosuvastatin  10 mg Oral Daily   sevelamer carbonate  2.4 g Oral TID WC    Dialysis Orders: SW TTS 3.5h  B400   41kg  2Kbath  TDC  Heparin none - rising post wt's  every day for the last 3 wks -  last HD 2/15 came off 5.5kg over - idwg's are not large at 1-2kg but UF is typically low 1.3- 1.7 L - sensipar 60 mg three times a week with HD - hectorol 9 mcg three times a week with HD - mircera 225 mcg every 2 weeks, last 2/4 at 100 mcg dose - venofer 50 mg weekly  Assessment/Plan: # ESRD - got HD overnight 2/20. On HD.   # Hyperkalemia - K+ was 7.5, now down to 4.5 after HD/ temp measures overnight. Much better. Renal diet.    # ID - possible TDC exit site infection and /or blood infection. Getting empiric abx w/ IV Vancomycin. Exit site remains not draining and dressing is clean, dry, and intact. She remains afebrile and WbC is stable. Blood cultures 2/20 NGTD so far.   # Volume - is up 7kg by wts after overnight HD. Plan large UF w/ HD Sat   # HTN  - optimize UF with HD. Continue home meds   # Secondary hyperparathyroidism - cont binders w/ diet. CCa and phos in range. Renal diet. To avoid duplication, please do not list sensipar or hectorol on her home medication list since they are just given at outpt HD    # Anemia of CKD  - note that her ESA is due; have ordered darbe 150 mcg every Friday   #Dispo - Informed by HD RN today of patient coming to her last outpatient HD treatment without a dressing. Patient remains afebrile and WBC is stable. TDC site does not appear infected today and dressing is clean,dry, and intact. Blood cultures NGTD so far. Plan to review final results of cultures in outpatient. Patient should be able to go home after HD today.  Monique Holmes, NP [facility] 07/24/2023,2:00 PM  LOS: 2 days

## 2023-07-24 NOTE — Progress Notes (Signed)
 Patient refused daily bath multiple times. RN educated patient on importance. Charge RN aware.

## 2023-07-24 NOTE — Plan of Care (Signed)
°  Problem: Clinical Measurements: °Goal: Ability to maintain clinical measurements within normal limits will improve °Outcome: Progressing °  °Problem: Clinical Measurements: °Goal: Diagnostic test results will improve °Outcome: Progressing °  °

## 2023-07-25 DIAGNOSIS — L0390 Cellulitis, unspecified: Secondary | ICD-10-CM | POA: Insufficient documentation

## 2023-07-25 DIAGNOSIS — I5042 Chronic combined systolic (congestive) and diastolic (congestive) heart failure: Secondary | ICD-10-CM | POA: Diagnosis not present

## 2023-07-25 DIAGNOSIS — N186 End stage renal disease: Secondary | ICD-10-CM | POA: Diagnosis not present

## 2023-07-25 DIAGNOSIS — L03818 Cellulitis of other sites: Secondary | ICD-10-CM

## 2023-07-25 DIAGNOSIS — E875 Hyperkalemia: Secondary | ICD-10-CM | POA: Diagnosis not present

## 2023-07-25 MED ORDER — ISOSORBIDE MONONITRATE ER 30 MG PO TB24: 30.0000 mg | ORAL_TABLET | ORAL | Status: DC

## 2023-07-25 MED ORDER — CARVEDILOL 25 MG PO TABS
25.0000 mg | ORAL_TABLET | Freq: Two times a day (BID) | ORAL | Status: DC
  Administered 2023-07-25: 25 mg via ORAL
  Filled 2023-07-25: qty 1

## 2023-07-25 MED ORDER — VANCOMYCIN HCL 500 MG/100ML IV SOLN: 500.0000 mg | INTRAVENOUS | Status: AC

## 2023-07-25 MED ORDER — ISOSORBIDE MONONITRATE ER 60 MG PO TB24: 60.0000 mg | ORAL_TABLET | ORAL | Status: DC

## 2023-07-25 NOTE — Discharge Planning (Signed)
 [facility] Patient Discharge Orders- [facility] CLINIC: [facility]  Patient's name: Monique Henderson Admit/DC Dates: 07/22/2023 - 07/25/2023  Discharge Diagnoses: Possible TDC exit site infection -  Exit site remained not draining and dressing was clean, dry, and intact. She remained afebrile and WBC stable. Blood cultures 2/20 still NGTD. ID was not consulted nor was the [facility] exchanged. See below.  Hyperkalemia - resolved with HD  Aranesp: Given: Yes    Date and amount of last dose: given on 07/23/23   Last Hgb: 9.4 PRBC's Given: No  ESA dose for discharge: Resume mircera 225 mcg IV q 2 weeks  IV Iron dose at discharge: Hold Fe while on ABXs. Resume when course is completed.  Heparin change: No  EDW Change: No. Post standing weight today was 46.1kg. Continue to push UF  Bath Change: No  Access intervention/Change: No   Hectorol change: No  Discharge Labs: Calcium 9.0 Phosphorus 3.7 Albumin 3.3 K+ 4.5  IV Antibiotics: Yes Details: Possible TDC exit site infection: Vancomycin 500mg  IV with HD until 08/01/23.  On Coumadin?: No. On Eliquis   OTHER/APPTS/LAB ORDERS: Review final blood culture results from 2/20!!!!!. Results NGTD as of today (2/23 at 4pm)    D/C Meds to be reconciled by nurse after every discharge.  Completed By: Salome Holmes, NP   Reviewed by: MD:______ RN_______

## 2023-07-25 NOTE — Progress Notes (Addendum)
 Pharmacy Antibiotic Note  Monique Henderson is a 73 y.o. female admitted on 07/22/2023 with  concern for infected tunneled HD catheter .  Pharmacy has been consulted for vancomycin dosing.  WBC wnl, afebrile. Currently on TTS HD schedule.   Plan: Continue vancomycin 500 mg IV doses after each HD session Monitor clinical status, HD schedule, and culture data Levels as indicated F/u LOT  Height: 5\' 3"  (160 cm) Weight: 46.4 kg (102 lb 3.2 oz) IBW/kg (Calculated) : 52.4  Temp (24hrs), Avg:98.5 F (36.9 C), Min:98.1 F (36.7 C), Max:99 F (37.2 C)  Recent Labs  Lab 07/22/23 1553 07/23/23 0418  WBC 6.0 5.4  CREATININE 14.08* 6.04*    Estimated Creatinine Clearance: 6.3 mL/min (A) (by C-G formula based on SCr of 6.04 mg/dL (H)).    Allergies  Allergen Reactions   Shrimp [Shellfish Allergy] Shortness Of Breath   Bactroban [Mupirocin] Other (See Comments)    "Sores in nose"   Chlorhexidine Gluconate Itching   Hydrocodone Itching, Nausea And Vomiting and Nausea Only   Latex Itching    Latex gloves cause itchiness and a rash   Tylenol [Acetaminophen] Itching    Takes Percocet at home 05/06/23   Zestril [Lisinopril] Cough    Antimicrobials this admission: Vancomycin 2/20 >>    Microbiology results: 2/20 BCx: sent   Thank you for allowing pharmacy to be a part of this patient's care.  Griffin Dakin 07/25/2023 9:49 AM

## 2023-07-25 NOTE — Progress Notes (Signed)
 Endorsing 10 out of 10 sharp lower abdominal pain. Patient states this has been happening for her around 2:00AM over the last month or so and she would normally take a hydrocodone. Given PRN dilaudid. Will continue to assess pain.

## 2023-07-25 NOTE — Discharge Summary (Signed)
 Physician Discharge Summary  Monique Henderson:[id number] DOB: [birth year] DOA: 07/22/2023  PCP: Fleet Contras, MD  Admit date: 07/22/2023 Discharge date: 07/25/23  Admitted From: Home Disposition: Home Recommendations for Outpatient Follow-up:  Follow up with PCP in 1 to 2 weeks Reassess skin around the catheter for any signs of infection. Please follow up on the following pending results: None  Home Health: No need identified Equipment/Devices: No need identified  Discharge Condition: Stable CODE STATUS: DNR/DNI  Follow-up Information     Fleet Contras, MD. Schedule an appointment as soon as possible for a visit in 1 week(s).   Specialty: Internal Medicine Contact information: [address] [place] [address] [phone]                 Hospital course 73 year old F with PMH of ESRD on HD TTS, paroxysmal A-fib on Eliquis, aortic dissection s/p repair, embolic CVA with residual dysarthria, combined CHF, COPD, HTN and HLD directed to ED on 2/20 due to concern for skin infection around dialysis catheter site, and admitted for hyperkalemia and concern for cellulitis around [facility].  Reportedly, staff at outpatient dialysis expressed some pus from the skin around the dialysis catheter.  K was elevated to 7.5 on arrival.  This was in the setting of missed dialysis.  Reportedly, she missed 3 sessions.  Nephrology consulted.  Blood cultures obtained.  She had dialysis and also started on IV vancomycin.  Patient had no leukocytosis or fever throughout her hospitalization to suggest systemic infection.  Eventually, hyperkalemia resolved.  She has not had further drainage from the skin around dialysis catheter.  She was continued on IV vancomycin.  Blood cultures NGTD for 72 hours.  She is discharged on IV vancomycin with HD for 7 more days for possible pericatheter cellulitis.  Nephrology aware of this plan, and will arrange IV vancomycin with dialysis outpatient.  Of  note, some of patient's antihypertensive meds were held during this hospitalization and blood pressure was elevated.  Blood pressure improved after resuming all home antihypertensive meds.  On the day of discharge, she felt well and ready to go home.  See individual problem list below for more.   Problems addressed during this hospitalization Principal Problem:   Hyperkalemia Active Problems:   Paroxysmal atrial fibrillation (HCC)   Essential hypertension   ESRD (end stage renal disease) (HCC)   History of anemia due to chronic kidney disease   HLD (hyperlipidemia)   Chronic combined systolic and diastolic heart failure (HCC)   Complication of dialysis access insertion   History of COPD   Cellulitis              Time spent 35 minutes  Vital signs Vitals:   07/25/23 0309 07/25/23 0616 07/25/23 0744 07/25/23 1136  BP: (!) 185/92   (!) 160/79  Pulse: 77     Temp: 98.4 F (36.9 C)     Resp: 18  18   Height:      Weight:  46.4 kg  46.1 kg  SpO2: 100%     TempSrc: Oral     BMI (Calculated):  18.11  18     Discharge exam  GENERAL: No apparent distress.  Nontoxic. HEENT: MMM.  Vision and hearing grossly intact.  NECK: Supple.  No apparent JVD.  RESP:  No IWOB.  Fair aeration bilaterally. CVS:  RRR. Heart sounds normal.  ABD/GI/GU: BS+. Abd soft, NTND.  MSK/EXT:  Moves extremities. No apparent deformity. No edema.  SKIN: No signs of  cellulitis around dialysis catheter on the day of discharge.  No drainage. NEURO: Awake and alert. Oriented appropriately.  No apparent focal neuro deficit. PSYCH: Calm. Normal affect.   Discharge Instructions Discharge Instructions     Diet - low sodium heart healthy   Complete by: As directed    Discharge instructions   Complete by: As directed    It has been a pleasure taking care of you!  You were hospitalized due to high potassium and concern for infection around your catheter.  Your potassium has corrected.  Your catheter  site seems to have cleared off.  Continue dialysis outpatient.  Follow-up with your primary care doctor in 1 to 2 weeks or sooner if needed.   Take care,   Increase activity slowly   Complete by: As directed    Increase activity slowly   Complete by: As directed       Allergies as of 07/25/2023       Reactions   Shrimp [shellfish Allergy] Shortness Of Breath   Bactroban [mupirocin] Other (See Comments)   "Sores in nose"   Chlorhexidine Gluconate Itching   Hydrocodone Itching, Nausea And Vomiting, Nausea Only   Latex Itching   Latex gloves cause itchiness and a rash   Tylenol [acetaminophen] Itching   Takes Percocet at home 05/06/23   Zestril [lisinopril] Cough        Medication List     TAKE these medications    albuterol (2.5 MG/3ML) 0.083% nebulizer solution Commonly known as: PROVENTIL Take 2.5 mg by nebulization every 6 (six) hours as needed for wheezing or shortness of breath.   albuterol 108 (90 Base) MCG/ACT inhaler Commonly known as: VENTOLIN HFA Inhale 2 puffs into the lungs every 6 (six) hours as needed for wheezing or shortness of breath.   amLODipine 10 MG tablet Commonly known as: NORVASC Take 1 tablet (10 mg total) by mouth daily.   apixaban 2.5 MG Tabs tablet Commonly known as: ELIQUIS Take 1 tablet (2.5 mg total) by mouth 2 (two) times daily.   carvedilol 25 MG tablet Commonly known as: COREG Take 1 tablet (25 mg total) by mouth 2 (two) times daily with a meal.   cloNIDine 0.1 MG tablet Commonly known as: Catapres Take 1 tablet (0.1 mg total) by mouth 3 (three) times daily.   hydrALAZINE 100 MG tablet Commonly known as: APRESOLINE Take 1 tablet (100 mg total) by mouth every 8 (eight) hours.   isosorbide mononitrate 30 MG 24 hr tablet Commonly known as: IMDUR TAKE 1 TABLET (30MG ) ON DIALYSIS DAYS AND 2 TABLETS (60 MG) ON NON DIALYSIS DAYS.   MIRCERA IJ every 14 (fourteen) days.   naloxone 4 MG/0.1ML Liqd nasal spray kit Commonly known  as: NARCAN Place 1 spray into the nose as needed (opioid overdose).   oxyCODONE-acetaminophen 10-325 MG tablet Commonly known as: PERCOCET Take 1 tablet by mouth in the morning and at bedtime.   pantoprazole 40 MG tablet Commonly known as: PROTONIX TAKE 1 TABLET (40 MG TOTAL) BY MOUTH 2 (TWO) TIMES DAILY.   rosuvastatin 10 MG tablet Commonly known as: CRESTOR TAKE 1 TABLET (10 MG TOTAL) BY MOUTH DAILY FOR CHOLESTEROL   senna-docusate 8.6-50 MG tablet Commonly known as: Senokot-S Take 1 tablet by mouth 2 (two) times daily between meals as needed for mild constipation.   sevelamer carbonate 2.4 g Pack Commonly known as: RENVELA Take 2.4 g by mouth 3 (three) times daily with meals.   traZODone 50 MG tablet Commonly known  as: DESYREL Take 25-50 mg by mouth at bedtime as needed for sleep.   vancomycin 500 MG/100ML IVPB Commonly known as: VANCOREADY Inject 100 mLs (500 mg total) into the vein Every Tuesday,Thursday,and Saturday with dialysis for 5 days. Start taking on: July 27, 2023   VITAMIN D-3 PO Take 1 capsule by mouth daily.        Consultations: Nephrology  Procedures/Studies:   DG Chest Portable 1 View Result Date: 07/22/2023 CLINICAL DATA:  c/f infected permacath site. EXAM: PORTABLE CHEST 1 VIEW COMPARISON:  06/17/2023 FINDINGS: Low lung volume. There are mildly increased interstitial markings, less pronounced than the prior exam. There patchy atelectatic changes/scarring at the left lung base. Bilateral lung fields are otherwise clear. Bilateral costophrenic angles are clear. Note is made of elevated right hemidiaphragm. Stable cardio-mediastinal silhouette. Sternotomy wires noted. No acute osseous abnormalities. The soft tissues are within normal limits. Right IJ central venous catheter noted with its tip overlying the right atrium. IMPRESSION: *Mildly increased interstitial markings, less pronounced than the prior exam. No acute cardiopulmonary abnormality.  Electronically Signed   By: Jules Schick M.D.   On: 07/22/2023 16:14   PYP Scan Result Date: 07/19/2023   Myocardial uptake was negative for radiotracer uptake. The visual grade of myocardial uptake relative to the ribs was Grade 0 (No myocardial uptake and normal bone uptake).   Findings are not suggestive (Grade 0) of cardiac ATTR amyloidosis.   MR BRAIN WO CONTRAST Result Date: 06/25/2023 CLINICAL DATA:  Altered mental status, fall EXAM: MRI HEAD WITHOUT CONTRAST TECHNIQUE: Multiplanar, multiecho pulse sequences of the brain and surrounding structures were obtained without intravenous contrast. COMPARISON:  02/25/2022 MRI head, correlation is also made with 06/25/2023 CT head FINDINGS: Brain: No restricted diffusion to suggest acute or subacute infarct. No acute hemorrhage, mass, mass effect, or midline shift. No hydrocephalus or extra-axial collection. Pituitary and craniocervical junction within normal limits. Remote infarct in the left frontal and parietal cortex. Diffusely dilated perivascular spaces in the basal ganglia, thalami, and hippocampi. Confluent T2 hyperintense signal in the periventricular white matter, likely the sequela of severe chronic small vessel ischemic disease. Vascular: Normal arterial flow voids. Skull and upper cervical spine: Normal marrow signal. Sinuses/Orbits: Clear paranasal sinuses. No acute finding in the orbits. Other: The mastoid air cells are well aerated. IMPRESSION: No acute intracranial process. No evidence of acute or subacute infarct. Electronically Signed   By: Wiliam Ke M.D.   On: 06/25/2023 19:14   DG Hand Complete Right Result Date: 06/25/2023 CLINICAL DATA:  Right hand pain after fall. EXAM: RIGHT HAND - COMPLETE 3+ VIEW COMPARISON:  Right hand x-rays dated June 10, 2006. FINDINGS: There is no evidence of fracture or dislocation. Scattered mild degenerative changes. Osteopenia. Soft tissues are unremarkable. IMPRESSION: 1. No acute osseous  abnormality. Electronically Signed   By: Obie Dredge M.D.   On: 06/25/2023 16:48   CT Head Wo Contrast Result Date: 06/25/2023 CLINICAL DATA:  Mild EXAM: CT HEAD WITHOUT CONTRAST CT CERVICAL SPINE WITHOUT CONTRAST TECHNIQUE: Multidetector CT imaging of the head and cervical spine was performed following the standard protocol without intravenous contrast. Multiplanar CT image reconstructions of the cervical spine were also generated. RADIATION DOSE REDUCTION: This exam was performed according to the departmental dose-optimization program which includes automated exposure control, adjustment of the mA and/or kV according to patient size and/or use of iterative reconstruction technique. COMPARISON:  None Available. FINDINGS: CT HEAD FINDINGS Brain: Remote appearing high left frontal cortical infarct. Patchy white matter  hypodensities are compatible with chronic microvascular ischemic disease. No evidence of acute hemorrhage, mass lesion, midline shift or hydrocephalus. Vascular: No hyperdense vessel.  Calcific atherosclerosis. Skull: No acute fracture. Sinuses/Orbits: Clear sinuses.  No acute orbital findings. Other: No mastoid effusions. CT CERVICAL SPINE FINDINGS Alignment: No substantial sagittal subluxation. Skull base and vertebrae: No acute fracture. No primary bone lesion or focal pathologic process. Soft tissues and spinal canal: No prevertebral fluid or swelling. No visible canal hematoma. Disc levels: Moderate multilevel degenerative change including degenerative disc height loss and bridging anterior osteophytes at multiple levels. Facet and uncovertebral hypertrophy with varying degrees of neural foraminal stenosis. Upper chest: Visualized lung apices are clear.  Emphysema. IMPRESSION: 1. No evidence of acute abnormality intracranially in the cervical spine. 2. Remote appearing high left frontal cortical infarct and chronic microvascular ischemic disease. An MRI could provide more sensitive  evaluation for acute infarct if clinically warranted. Electronically Signed   By: Feliberto Harts M.D.   On: 06/25/2023 14:53   CT Cervical Spine Wo Contrast Result Date: 06/25/2023 CLINICAL DATA:  Mild EXAM: CT HEAD WITHOUT CONTRAST CT CERVICAL SPINE WITHOUT CONTRAST TECHNIQUE: Multidetector CT imaging of the head and cervical spine was performed following the standard protocol without intravenous contrast. Multiplanar CT image reconstructions of the cervical spine were also generated. RADIATION DOSE REDUCTION: This exam was performed according to the departmental dose-optimization program which includes automated exposure control, adjustment of the mA and/or kV according to patient size and/or use of iterative reconstruction technique. COMPARISON:  None Available. FINDINGS: CT HEAD FINDINGS Brain: Remote appearing high left frontal cortical infarct. Patchy white matter hypodensities are compatible with chronic microvascular ischemic disease. No evidence of acute hemorrhage, mass lesion, midline shift or hydrocephalus. Vascular: No hyperdense vessel.  Calcific atherosclerosis. Skull: No acute fracture. Sinuses/Orbits: Clear sinuses.  No acute orbital findings. Other: No mastoid effusions. CT CERVICAL SPINE FINDINGS Alignment: No substantial sagittal subluxation. Skull base and vertebrae: No acute fracture. No primary bone lesion or focal pathologic process. Soft tissues and spinal canal: No prevertebral fluid or swelling. No visible canal hematoma. Disc levels: Moderate multilevel degenerative change including degenerative disc height loss and bridging anterior osteophytes at multiple levels. Facet and uncovertebral hypertrophy with varying degrees of neural foraminal stenosis. Upper chest: Visualized lung apices are clear.  Emphysema. IMPRESSION: 1. No evidence of acute abnormality intracranially in the cervical spine. 2. Remote appearing high left frontal cortical infarct and chronic microvascular ischemic  disease. An MRI could provide more sensitive evaluation for acute infarct if clinically warranted. Electronically Signed   By: Feliberto Harts M.D.   On: 06/25/2023 14:53       The results of significant diagnostics from this hospitalization (including imaging, microbiology, ancillary and laboratory) are listed below for reference.     Microbiology: Recent Results (from the past 240 hours)  Blood culture (routine x 2)     Status: None (Preliminary result)   Collection Time: 07/22/23  3:53 PM   Specimen: BLOOD  Result Value Ref Range Status   Specimen Description BLOOD SITE NOT SPECIFIED  Final   Special Requests   Final    BOTTLES DRAWN AEROBIC AND ANAEROBIC Blood Culture results may not be optimal due to an inadequate volume of blood received in culture bottles   Culture   Final    NO GROWTH 3 DAYS Performed at [facility], [address] [place], [place] [address]    Report Status PENDING  Incomplete  Blood culture (routine x  2)     Status: None (Preliminary result)   Collection Time: 07/22/23  3:58 PM   Specimen: BLOOD  Result Value Ref Range Status   Specimen Description BLOOD SITE NOT SPECIFIED  Final   Special Requests   Final    BOTTLES DRAWN AEROBIC AND ANAEROBIC Blood Culture results may not be optimal due to an inadequate volume of blood received in culture bottles   Culture   Final    NO GROWTH 3 DAYS Performed at [facility], [address] [place], [place] [address]    Report Status PENDING  Incomplete  MRSA Next Gen by PCR, Nasal     Status: Abnormal   Collection Time: 07/23/23  5:08 AM   Specimen: Nasal Mucosa; Nasal Swab  Result Value Ref Range Status   MRSA by PCR Next Gen DETECTED (A) NOT DETECTED Final    Comment: RESULT CALLED TO, READ BACK BY AND VERIFIED WITH: RN KAYLA on [id number] @[phone]  by SM (NOTE) The GeneXpert MRSA Assay (FDA approved for NASAL specimens only), is one component of a comprehensive MRSA colonization  surveillance program. It is not intended to diagnose MRSA infection nor to guide or monitor treatment for MRSA infections. Test performance is not FDA approved in patients less than 49 years old. Performed at [facility], [address], [place], [place] [address]      Labs:  CBC: Recent Labs  Lab 07/22/23 1553 07/23/23 0418  WBC 6.0 5.4  NEUTROABS 4.0 3.7  HGB 9.5* 9.4*  HCT 30.3* 29.9*  MCV 94.7 93.1  PLT 205 192   BMP &GFR Recent Labs  Lab 07/22/23 1553 07/23/23 0418  NA 140 137  K >7.5* 4.5  CL 99 95*  CO2 25 29  GLUCOSE 103* 90  BUN 74* 22  CREATININE 14.08* 6.04*  CALCIUM 9.0 9.0  MG 2.7* 1.9  PHOS  --  3.7   Estimated Creatinine Clearance: 6.3 mL/min (A) (by C-G formula based on SCr of 6.04 mg/dL (H)). Liver & Pancreas: Recent Labs  Lab 07/22/23 1553 07/23/23 0418  AST 16 15  ALT 9 9  ALKPHOS 62 52  BILITOT 0.6 0.7  PROT 7.0 6.6  ALBUMIN 3.5 3.3*   No results for input(s): "LIPASE", "AMYLASE" in the last 168 hours. No results for input(s): "AMMONIA" in the last 168 hours. Diabetic: No results for input(s): "HGBA1C" in the last 72 hours. No results for input(s): "GLUCAP" in the last 168 hours. Cardiac Enzymes: No results for input(s): "CKTOTAL", "CKMB", "CKMBINDEX", "TROPONINI" in the last 168 hours. No results for input(s): "PROBNP" in the last 8760 hours. Coagulation Profile: No results for input(s): "INR", "PROTIME" in the last 168 hours. Thyroid Function Tests: No results for input(s): "TSH", "T4TOTAL", "FREET4", "T3FREE", "THYROIDAB" in the last 72 hours. Lipid Profile: No results for input(s): "CHOL", "HDL", "LDLCALC", "TRIG", "CHOLHDL", "LDLDIRECT" in the last 72 hours. Anemia Panel: No results for input(s): "VITAMINB12", "FOLATE", "FERRITIN", "TIBC", "IRON", "RETICCTPCT" in the last 72 hours. Urine analysis:    Component Value Date/Time   COLORURINE AMBER (A) 12/27/2021 1840   APPEARANCEUR TURBID (A) 12/27/2021 1840    LABSPEC 1.013 12/27/2021 1840   PHURINE 8.0 12/27/2021 1840   GLUCOSEU NEGATIVE 12/27/2021 1840   GLUCOSEU NEG mg/dL 82/95/6213 0865   HGBUR MODERATE (A) 12/27/2021 1840   BILIRUBINUR NEGATIVE 12/27/2021 1840   BILIRUBINUR negative 05/04/2018 0910   KETONESUR NEGATIVE 12/27/2021 1840   PROTEINUR >=300 (A) 12/27/2021 1840   UROBILINOGEN 0.2 05/04/2018  0910   UROBILINOGEN 1 02/14/2014 0946   NITRITE NEGATIVE 12/27/2021 1840   LEUKOCYTESUR LARGE (A) 12/27/2021 1840   Sepsis Labs: Invalid input(s): "PROCALCITONIN", "LACTICIDVEN"   SIGNED:  Almon Hercules, MD  [facility] 07/25/2023, 1:09 PM

## 2023-07-25 NOTE — Progress Notes (Addendum)
 [facility] Progress Note   Subjective:    Seen and examined patient at bedside. Tolerated yesterday's HD with net UF 3L. She reports feeling well. Blood cultures 2/20 still remain NGTD. Discussed with Hospitalist. Plan for dc today.  Objective Vitals:   07/24/23 2315 07/25/23 0309 07/25/23 0616 07/25/23 0744  BP: (!) 191/91 (!) 185/92    Pulse: 75 77    Resp: 20 18  18   Temp: 98.9 F (37.2 C) 98.4 F (36.9 C)    TempSrc: Oral Oral    SpO2: 99% 100%    Weight:   46.4 kg   Height:       Physical Exam General: Chronically ill-appearing; NAD Heart: S1 and S2; No murmurs, gallops, or rubs Lungs: Clear throughout; No rales, wheezing, or rhonchi Abdomen: Soft and non-tender Extremities: No LE edema Dialysis Access: R TDC, dressing is clean, dry, and intact   Filed Weights   07/24/23 1000 07/24/23 1129 07/25/23 0616  Weight: 49.9 kg 47.4 kg 46.4 kg    Intake/Output Summary (Last 24 hours) at 07/25/2023 1116 Last data filed at 07/25/2023 0943 Gross per 24 hour  Intake 538 ml  Output 103 ml  Net 435 ml    Additional Objective Labs: Basic Metabolic Panel: Recent Labs  Lab 07/22/23 1553 07/23/23 0418  NA 140 137  K >7.5* 4.5  CL 99 95*  CO2 25 29  GLUCOSE 103* 90  BUN 74* 22  CREATININE 14.08* 6.04*  CALCIUM 9.0 9.0  PHOS  --  3.7   Liver Function Tests: Recent Labs  Lab 07/22/23 1553 07/23/23 0418  AST 16 15  ALT 9 9  ALKPHOS 62 52  BILITOT 0.6 0.7  PROT 7.0 6.6  ALBUMIN 3.5 3.3*   No results for input(s): "LIPASE", "AMYLASE" in the last 168 hours. CBC: Recent Labs  Lab 07/22/23 1553 07/23/23 0418  WBC 6.0 5.4  NEUTROABS 4.0 3.7  HGB 9.5* 9.4*  HCT 30.3* 29.9*  MCV 94.7 93.1  PLT 205 192   Blood Culture    Component Value Date/Time   SDES BLOOD SITE NOT SPECIFIED 07/22/2023 1558   SPECREQUEST  07/22/2023 1558    BOTTLES DRAWN AEROBIC AND ANAEROBIC Blood Culture results may not be optimal due to an inadequate volume of blood  received in culture bottles   CULT  07/22/2023 1558    NO GROWTH 3 DAYS Performed at [facility], [address] [place], [place] [address]    REPTSTATUS PENDING 07/22/2023 1558    Cardiac Enzymes: No results for input(s): "CKTOTAL", "CKMB", "CKMBINDEX", "TROPONINI" in the last 168 hours. CBG: No results for input(s): "GLUCAP" in the last 168 hours. Iron Studies: No results for input(s): "IRON", "TIBC", "TRANSFERRIN", "FERRITIN" in the last 72 hours. Lab Results  Component Value Date   INR 1.1 06/25/2023   INR 1.1 06/02/2023   INR 1.2 09/25/2022   Studies/Results: No results found.  Medications:  vancomycin Stopped (07/24/23 1204)    amLODipine  10 mg Oral Daily   apixaban  2.5 mg Oral BID   carvedilol  25 mg Oral BID WC   cloNIDine  0.1 mg Oral TID   darbepoetin (ARANESP) injection - DIALYSIS  150 mcg Subcutaneous Q Fri-1800   hydrALAZINE  100 mg Oral Q8H   isosorbide mononitrate  60 mg Oral Q M,W,F,Su-1800   And   [START ON 07/27/2023] isosorbide mononitrate  30 mg Oral Q T,Th,Sa-HD   pantoprazole  40 mg Oral BID   rosuvastatin  10 mg Oral Daily   sevelamer carbonate  2.4 g Oral TID WC    Dialysis Orders: SW TTS 3.5h  B400   41kg  2Kbath  TDC  Heparin none - rising post wt's every day for the last 3 wks -  last HD 2/15 came off 5.5kg over - idwg's are not large at 1-2kg but UF is typically low 1.3- 1.7 L - sensipar 60 mg three times a week with HD - hectorol 9 mcg three times a week with HD - mircera 225 mcg every 2 weeks, last 2/4 at 100 mcg dose - venofer 50 mg weekly  Assessment/Plan:  # ESRD - Received HD 2/22 and 3L removed. Next HD 2/25 in outpatient.   # Hyperkalemia - Resolved. Continue renal diet.    # ID - possible TDC exit site infection and /or blood infection. Getting empiric abx w/ IV Vancomycin here. Exit site remains not draining and dressing is clean, dry, and intact. She remains afebrile and WbC is stable. Blood cultures 2/20 still  NGTD. Plan to continue Vancomycin 500mg  IV X 5 days with HD in outpatient end date of 08/01/23.   # Volume - is up 7kg by wts after overnight HD. Removed 3L 2/22. Euvolemic on exam. Standing weight today was 46.1kg. Continue pushing UF in outpatient.   # HTN  - optimize UF with HD. Continue home meds. See above.   # Secondary hyperparathyroidism - cont binders w/ diet. CCa and phos in range. Renal diet. To avoid duplication, please do not list sensipar or hectorol on her home medication list since they are just given at outpt HD    # Anemia of CKD  - note that her ESA is due; have ordered darbe 150 mcg every Friday    #Dispo - Informed by HD RN of patient coming to her last outpatient HD treatment without a dressing. Patient remains afebrile and WBC is stable. TDC site does not appear infected and dressing is clean,dry, and intact. Blood cultures NGTD so far. Plan to review final results of cultures in outpatient. She will also receive IV Vancomycin X 5 days with HD in outpatient (see above for end date). Okay for discharge from a renal standpoint.   Salome Holmes, NP [facility] 07/25/2023,11:16 AM  LOS: 3 days

## 2023-07-26 ENCOUNTER — Other Ambulatory Visit: Payer: Self-pay | Admitting: Internal Medicine

## 2023-07-26 DIAGNOSIS — Z1231 Encounter for screening mammogram for malignant neoplasm of breast: Secondary | ICD-10-CM

## 2023-07-26 NOTE — Progress Notes (Signed)
 Late Note Entry- Jul 26, 2023  Pt was d/c yesterday. Contacted [facility] this morning to be advised of pt's d/c date and that pt should resume care tomorrow.   Olivia Canter Renal Navigator [phone]

## 2023-07-27 LAB — CULTURE, BLOOD (ROUTINE X 2)
Culture: NO GROWTH
Culture: NO GROWTH

## 2023-08-04 ENCOUNTER — Ambulatory Visit: Payer: Medicare Other

## 2023-08-09 ENCOUNTER — Inpatient Hospital Stay (HOSPITAL_COMMUNITY)
Admission: EM | Admit: 2023-08-09 | Discharge: 2023-08-11 | DRG: 291 | Disposition: A | Discharge status: Home / Self Care | Attending: Internal Medicine | Admitting: Internal Medicine

## 2023-08-09 ENCOUNTER — Other Ambulatory Visit: Payer: Self-pay

## 2023-08-09 ENCOUNTER — Emergency Department (HOSPITAL_COMMUNITY)

## 2023-08-09 ENCOUNTER — Encounter (HOSPITAL_COMMUNITY): Payer: Self-pay | Admitting: Emergency Medicine

## 2023-08-09 DIAGNOSIS — R54 Age-related physical debility: Secondary | ICD-10-CM | POA: Diagnosis present

## 2023-08-09 DIAGNOSIS — Z91040 Latex allergy status: Secondary | ICD-10-CM

## 2023-08-09 DIAGNOSIS — Z820 Family history of epilepsy and other diseases of the nervous system: Secondary | ICD-10-CM

## 2023-08-09 DIAGNOSIS — R0602 Shortness of breath: Principal | ICD-10-CM

## 2023-08-09 DIAGNOSIS — I4510 Unspecified right bundle-branch block: Secondary | ICD-10-CM | POA: Diagnosis present

## 2023-08-09 DIAGNOSIS — Z992 Dependence on renal dialysis: Secondary | ICD-10-CM

## 2023-08-09 DIAGNOSIS — E875 Hyperkalemia: Secondary | ICD-10-CM | POA: Diagnosis present

## 2023-08-09 DIAGNOSIS — I509 Heart failure, unspecified: Secondary | ICD-10-CM

## 2023-08-09 DIAGNOSIS — R7989 Other specified abnormal findings of blood chemistry: Secondary | ICD-10-CM | POA: Diagnosis present

## 2023-08-09 DIAGNOSIS — I169 Hypertensive crisis, unspecified: Secondary | ICD-10-CM | POA: Diagnosis present

## 2023-08-09 DIAGNOSIS — Z86000 Personal history of in-situ neoplasm of breast: Secondary | ICD-10-CM

## 2023-08-09 DIAGNOSIS — Z79899 Other long term (current) drug therapy: Secondary | ICD-10-CM

## 2023-08-09 DIAGNOSIS — R296 Repeated falls: Secondary | ICD-10-CM | POA: Diagnosis present

## 2023-08-09 DIAGNOSIS — Z8249 Family history of ischemic heart disease and other diseases of the circulatory system: Secondary | ICD-10-CM

## 2023-08-09 DIAGNOSIS — Z9012 Acquired absence of left breast and nipple: Secondary | ICD-10-CM

## 2023-08-09 DIAGNOSIS — Z87442 Personal history of urinary calculi: Secondary | ICD-10-CM

## 2023-08-09 DIAGNOSIS — Z8673 Personal history of transient ischemic attack (TIA), and cerebral infarction without residual deficits: Secondary | ICD-10-CM

## 2023-08-09 DIAGNOSIS — I5023 Acute on chronic systolic (congestive) heart failure: Secondary | ICD-10-CM | POA: Diagnosis present

## 2023-08-09 DIAGNOSIS — Z8619 Personal history of other infectious and parasitic diseases: Secondary | ICD-10-CM

## 2023-08-09 DIAGNOSIS — N2581 Secondary hyperparathyroidism of renal origin: Secondary | ICD-10-CM | POA: Diagnosis present

## 2023-08-09 DIAGNOSIS — Z87891 Personal history of nicotine dependence: Secondary | ICD-10-CM

## 2023-08-09 DIAGNOSIS — N186 End stage renal disease: Secondary | ICD-10-CM | POA: Diagnosis present

## 2023-08-09 DIAGNOSIS — Z853 Personal history of malignant neoplasm of breast: Secondary | ICD-10-CM

## 2023-08-09 DIAGNOSIS — Z886 Allergy status to analgesic agent status: Secondary | ICD-10-CM

## 2023-08-09 DIAGNOSIS — F1410 Cocaine abuse, uncomplicated: Secondary | ICD-10-CM | POA: Diagnosis present

## 2023-08-09 DIAGNOSIS — M898X9 Other specified disorders of bone, unspecified site: Secondary | ICD-10-CM | POA: Diagnosis present

## 2023-08-09 DIAGNOSIS — Z66 Do not resuscitate: Secondary | ICD-10-CM | POA: Diagnosis present

## 2023-08-09 DIAGNOSIS — I6930 Unspecified sequelae of cerebral infarction: Secondary | ICD-10-CM

## 2023-08-09 DIAGNOSIS — K219 Gastro-esophageal reflux disease without esophagitis: Secondary | ICD-10-CM | POA: Diagnosis present

## 2023-08-09 DIAGNOSIS — Z91013 Allergy to seafood: Secondary | ICD-10-CM

## 2023-08-09 DIAGNOSIS — Z800 Family history of malignant neoplasm of digestive organs: Secondary | ICD-10-CM

## 2023-08-09 DIAGNOSIS — Z888 Allergy status to other drugs, medicaments and biological substances status: Secondary | ICD-10-CM

## 2023-08-09 DIAGNOSIS — I132 Hypertensive heart and chronic kidney disease with heart failure and with stage 5 chronic kidney disease, or end stage renal disease: Principal | ICD-10-CM | POA: Diagnosis present

## 2023-08-09 DIAGNOSIS — I2489 Other forms of acute ischemic heart disease: Secondary | ICD-10-CM | POA: Diagnosis present

## 2023-08-09 DIAGNOSIS — Z8709 Personal history of other diseases of the respiratory system: Secondary | ICD-10-CM

## 2023-08-09 DIAGNOSIS — Z7901 Long term (current) use of anticoagulants: Secondary | ICD-10-CM

## 2023-08-09 DIAGNOSIS — Z981 Arthrodesis status: Secondary | ICD-10-CM

## 2023-08-09 DIAGNOSIS — I480 Paroxysmal atrial fibrillation: Secondary | ICD-10-CM | POA: Diagnosis present

## 2023-08-09 DIAGNOSIS — M79601 Pain in right arm: Secondary | ICD-10-CM | POA: Diagnosis present

## 2023-08-09 DIAGNOSIS — G4700 Insomnia, unspecified: Secondary | ICD-10-CM | POA: Diagnosis present

## 2023-08-09 DIAGNOSIS — I5022 Chronic systolic (congestive) heart failure: Secondary | ICD-10-CM | POA: Diagnosis present

## 2023-08-09 DIAGNOSIS — Z833 Family history of diabetes mellitus: Secondary | ICD-10-CM

## 2023-08-09 DIAGNOSIS — I161 Hypertensive emergency: Secondary | ICD-10-CM | POA: Diagnosis present

## 2023-08-09 DIAGNOSIS — D631 Anemia in chronic kidney disease: Secondary | ICD-10-CM | POA: Diagnosis present

## 2023-08-09 DIAGNOSIS — J449 Chronic obstructive pulmonary disease, unspecified: Secondary | ICD-10-CM | POA: Diagnosis present

## 2023-08-09 DIAGNOSIS — Z885 Allergy status to narcotic agent status: Secondary | ICD-10-CM

## 2023-08-09 LAB — CBC
HCT: 40.2 % (ref 36.0–46.0)
Hemoglobin: 12.4 g/dL (ref 12.0–15.0)
MCH: 30.1 pg (ref 26.0–34.0)
MCHC: 30.8 g/dL (ref 30.0–36.0)
MCV: 97.6 fL (ref 80.0–100.0)
Platelets: 215 10*3/uL (ref 150–400)
RBC: 4.12 MIL/uL (ref 3.87–5.11)
RDW: 21.2 % — ABNORMAL HIGH (ref 11.5–15.5)
WBC: 6.8 10*3/uL (ref 4.0–10.5)
nRBC: 0 % (ref 0.0–0.2)

## 2023-08-09 LAB — BASIC METABOLIC PANEL
Anion gap: 19 — ABNORMAL HIGH (ref 5–15)
BUN: 48 mg/dL — ABNORMAL HIGH (ref 8–23)
CO2: 25 mmol/L (ref 22–32)
Calcium: 9.8 mg/dL (ref 8.9–10.3)
Chloride: 95 mmol/L — ABNORMAL LOW (ref 98–111)
Creatinine, Ser: 9.46 mg/dL — ABNORMAL HIGH (ref 0.44–1.00)
GFR, Estimated: 4 mL/min — ABNORMAL LOW (ref >60)
Glucose, Bld: 104 mg/dL — ABNORMAL HIGH (ref 70–99)
Potassium: 5.5 mmol/L — ABNORMAL HIGH (ref 3.5–5.1)
Sodium: 139 mmol/L (ref 135–145)

## 2023-08-09 LAB — TROPONIN I (HIGH SENSITIVITY): Troponin I (High Sensitivity): 67 ng/L — ABNORMAL HIGH (ref <18)

## 2023-08-09 MED ORDER — METOCLOPRAMIDE HCL 5 MG/ML IJ SOLN
10.0000 mg | Freq: Once | INTRAMUSCULAR | Status: AC
  Administered 2023-08-09: 10 mg via INTRAVENOUS
  Filled 2023-08-09: qty 2

## 2023-08-09 MED ORDER — NITROGLYCERIN 2 % TD OINT
0.5000 [in_us] | TOPICAL_OINTMENT | Freq: Once | TRANSDERMAL | Status: AC
  Administered 2023-08-09: 0.5 [in_us] via TOPICAL
  Filled 2023-08-09: qty 1

## 2023-08-09 MED ORDER — MORPHINE SULFATE (PF) 2 MG/ML IV SOLN
2.0000 mg | Freq: Once | INTRAVENOUS | Status: AC
  Administered 2023-08-09: 2 mg via INTRAVENOUS
  Filled 2023-08-09: qty 1

## 2023-08-09 NOTE — ED Provider Notes (Signed)
 [place] EMERGENCY DEPARTMENT AT [facility] Provider Note   CSN: [id number] Arrival date & time: 08/09/23  2018     History {Add pertinent medical, surgical, social history, OB history to HPI:1} Chief Complaint  Patient presents with   Shortness of Breath    Monique Henderson is a 73 y.o. female.  Patient with past medical history significant for end-stage renal disease on Tuesday Thursday Saturday dialysis, hypertension, previous back surgery, heart failure presents to the emergency room via EMS complaining of shortness of breath and back pain.  Patient states that she had a normal dialysis session on Saturday this week with no difficulties.  She states that during the day today she has begun to feel short of breath.  She also reports tripping at home and falling and landing on her back.  She complains of pain all along the spine and is unable to localize the pain.  Patient with history of stroke with right-sided deficits.  No new neurologic deficit appreciated.  She states that her right upper arm is also painful secondary to the fall.  Patient noted to be hypertensive with EMS with a blood pressure of 150/140.  Patient was placed on 2 L/min nasal cannula with saturations improving to 99%.   Shortness of Breath      Home Medications Prior to Admission medications   Medication Sig Start Date End Date Taking? Authorizing Provider  albuterol (PROVENTIL) (2.5 MG/3ML) 0.083% nebulizer solution Take 2.5 mg by nebulization every 6 (six) hours as needed for wheezing or shortness of breath.    [provider]  albuterol (VENTOLIN HFA) 108 (90 Base) MCG/ACT inhaler Inhale 2 puffs into the lungs every 6 (six) hours as needed for wheezing or shortness of breath. 07/30/20   Janeece Agee, NP  amLODipine (NORVASC) 10 MG tablet Take 1 tablet (10 mg total) by mouth daily. 03/06/22   Setzer, Lynnell Jude, PA-C  apixaban (ELIQUIS) 2.5 MG TABS tablet Take 1 tablet (2.5 mg total) by  mouth 2 (two) times daily. 03/06/22   Setzer, Lynnell Jude, PA-C  carvedilol (COREG) 25 MG tablet Take 1 tablet (25 mg total) by mouth 2 (two) times daily with a meal. 05/01/22 06/16/24  Ghimire, Werner Lean, MD  Cholecalciferol (VITAMIN D-3 PO) Take 1 capsule by mouth daily.    [provider]  cloNIDine (CATAPRES) 0.1 MG tablet Take 1 tablet (0.1 mg total) by mouth 3 (three) times daily. 06/19/23   Hughie Closs, MD  hydrALAZINE (APRESOLINE) 100 MG tablet Take 1 tablet (100 mg total) by mouth every 8 (eight) hours. 12/18/22   Corky Crafts, MD  isosorbide mononitrate (IMDUR) 30 MG 24 hr tablet TAKE 1 TABLET (30MG ) ON DIALYSIS DAYS AND 2 TABLETS (60 MG) ON NON DIALYSIS DAYS. 01/12/23   Corky Crafts, MD  Methoxy PEG-Epoetin Beta (MIRCERA IJ) every 14 (fourteen) days. 07/06/23 07/04/24  [provider]  naloxone [facility]) nasal spray 4 mg/0.1 mL Place 1 spray into the nose as needed (opioid overdose). 02/19/22   [provider]  oxyCODONE-acetaminophen (PERCOCET) 10-325 MG tablet Take 1 tablet by mouth in the morning and at bedtime. 03/12/22   [provider]  pantoprazole (PROTONIX) 40 MG tablet TAKE 1 TABLET (40 MG TOTAL) BY MOUTH 2 (TWO) TIMES DAILY. 05/13/23   Georganna Skeans, MD  rosuvastatin (CRESTOR) 10 MG tablet TAKE 1 TABLET (10 MG TOTAL) BY MOUTH DAILY FOR CHOLESTEROL 08/07/22   Georganna Skeans, MD  senna-docusate (SENOKOT-S) 8.6-50 MG tablet Take 1  tablet by mouth 2 (two) times daily between meals as needed for mild constipation. 08/14/22   Almon Hercules, MD  sevelamer carbonate (RENVELA) 2.4 g PACK Take 2.4 g by mouth 3 (three) times daily with meals. 03/06/22   Setzer, Lynnell Jude, PA-C  traZODone (DESYREL) 50 MG tablet Take 25-50 mg by mouth at bedtime as needed for sleep. 03/19/23   [provider]      Allergies    Shrimp [shellfish allergy], Bactroban [mupirocin], Chlorhexidine gluconate, Hydrocodone, Latex, Tylenol [acetaminophen], and Zestril  [lisinopril]    Review of Systems   Review of Systems  Respiratory:  Positive for shortness of breath.     Physical Exam Updated Vital Signs BP (!) 241/142   Pulse 69   Temp 98.4 F (36.9 C) (Oral)   Resp (!) 34   SpO2 100%  Physical Exam Vitals and nursing note reviewed.  Constitutional:      General: She is not in acute distress.    Appearance: She is well-developed.  HENT:     Head: Normocephalic and atraumatic.  Eyes:     Conjunctiva/sclera: Conjunctivae normal.  Cardiovascular:     Rate and Rhythm: Normal rate and regular rhythm.     Heart sounds: No murmur heard. Pulmonary:     Effort: Pulmonary effort is normal. No respiratory distress.     Breath sounds: Rales present.  Abdominal:     Palpations: Abdomen is soft.     Tenderness: There is no abdominal tenderness.  Musculoskeletal:        General: No swelling.     Cervical back: Neck supple.     Comments: Generalized tenderness along spine, unable to pinpoint location  Skin:    General: Skin is warm and dry.     Capillary Refill: Capillary refill takes less than 2 seconds.  Neurological:     Mental Status: She is alert.     Comments: At baseline  Psychiatric:        Mood and Affect: Mood normal.     ED Results / Procedures / Treatments   Labs (all labs ordered are listed, but only abnormal results are displayed) Labs Reviewed  BASIC METABOLIC PANEL - Abnormal; Notable for the following components:      Result Value   Potassium 5.5 (*)    Chloride 95 (*)    Glucose, Bld 104 (*)    BUN 48 (*)    Creatinine, Ser 9.46 (*)    GFR, Estimated 4 (*)    Anion gap 19 (*)    All other components within normal limits  CBC - Abnormal; Notable for the following components:   RDW 21.2 (*)    All other components within normal limits  TROPONIN I (HIGH SENSITIVITY) - Abnormal; Notable for the following components:   Troponin I (High Sensitivity) 67 (*)    All other components within normal limits  TROPONIN I  (HIGH SENSITIVITY)    EKG EKG Interpretation Date/Time:  Monday August 09 2023 21:12:37 EDT Ventricular Rate:  96 PR Interval:  166 QRS Duration:  134 QT Interval:  408 QTC Calculation: 515 R Axis:   37  Text Interpretation: Normal sinus rhythm Left atrial enlargement Left ventricular hypertrophy with QRS widening ( R in aVL , [employer] product ) Confirmed by Vivi Barrack [phone]) on 08/09/2023 9:15:36 PM  Radiology No results found.  Procedures Procedures  {Document cardiac monitor, telemetry assessment procedure when appropriate:1}  Medications Ordered in ED Medications  morphine (PF) 2 MG/ML  injection 2 mg (2 mg Intravenous Given 08/09/23 2248)  nitroGLYCERIN (NITROGLYN) 2 % ointment 0.5 inch (0.5 inches Topical Given 08/09/23 2231)  metoCLOPramide (REGLAN) injection 10 mg (10 mg Intravenous Given 08/09/23 2253)    ED Course/ Medical Decision Making/ A&P   {   Click here for ABCD2, HEART and other calculatorsREFRESH Note before signing :1}                              Medical Decision Making Amount and/or Complexity of Data Reviewed Labs: ordered. Radiology: ordered.  Risk Prescription drug management.   ***  {Document critical care time when appropriate:1} {Document review of labs and clinical decision tools ie heart score, Chads2Vasc2 etc:1}  {Document your independent review of radiology images, and any outside records:1} {Document your discussion with family members, caretakers, and with consultants:1} {Document social determinants of health affecting pt's care:1} {Document your decision making why or why not admission, treatments were needed:1} Final Clinical Impression(s) / ED Diagnoses Final diagnoses:  None    Rx / DC Orders ED Discharge Orders     None

## 2023-08-09 NOTE — ED Triage Notes (Signed)
 Pt BIB EMS from home for ShOB x 1 hour and hypertension. Pt is a dialysis pt with last full treatment on Saturday. Hx of stroke with R sided deficits. BP 250/140 with EMS, 99% 2LNC, pt is not on oxygen at home.

## 2023-08-10 ENCOUNTER — Encounter (HOSPITAL_COMMUNITY): Payer: Self-pay | Admitting: Family Medicine

## 2023-08-10 DIAGNOSIS — I2489 Other forms of acute ischemic heart disease: Secondary | ICD-10-CM | POA: Diagnosis present

## 2023-08-10 DIAGNOSIS — Z8709 Personal history of other diseases of the respiratory system: Secondary | ICD-10-CM

## 2023-08-10 DIAGNOSIS — N2581 Secondary hyperparathyroidism of renal origin: Secondary | ICD-10-CM | POA: Diagnosis present

## 2023-08-10 DIAGNOSIS — Z833 Family history of diabetes mellitus: Secondary | ICD-10-CM | POA: Diagnosis not present

## 2023-08-10 DIAGNOSIS — D631 Anemia in chronic kidney disease: Secondary | ICD-10-CM | POA: Diagnosis present

## 2023-08-10 DIAGNOSIS — Z886 Allergy status to analgesic agent status: Secondary | ICD-10-CM | POA: Diagnosis not present

## 2023-08-10 DIAGNOSIS — E875 Hyperkalemia: Secondary | ICD-10-CM | POA: Diagnosis present

## 2023-08-10 DIAGNOSIS — Z8673 Personal history of transient ischemic attack (TIA), and cerebral infarction without residual deficits: Secondary | ICD-10-CM

## 2023-08-10 DIAGNOSIS — I5022 Chronic systolic (congestive) heart failure: Secondary | ICD-10-CM

## 2023-08-10 DIAGNOSIS — Z885 Allergy status to narcotic agent status: Secondary | ICD-10-CM | POA: Diagnosis not present

## 2023-08-10 DIAGNOSIS — Z7901 Long term (current) use of anticoagulants: Secondary | ICD-10-CM | POA: Diagnosis not present

## 2023-08-10 DIAGNOSIS — Z8249 Family history of ischemic heart disease and other diseases of the circulatory system: Secondary | ICD-10-CM | POA: Diagnosis not present

## 2023-08-10 DIAGNOSIS — I480 Paroxysmal atrial fibrillation: Secondary | ICD-10-CM

## 2023-08-10 DIAGNOSIS — Z992 Dependence on renal dialysis: Secondary | ICD-10-CM | POA: Diagnosis not present

## 2023-08-10 DIAGNOSIS — I161 Hypertensive emergency: Secondary | ICD-10-CM | POA: Diagnosis present

## 2023-08-10 DIAGNOSIS — Z91040 Latex allergy status: Secondary | ICD-10-CM | POA: Diagnosis not present

## 2023-08-10 DIAGNOSIS — I6930 Unspecified sequelae of cerebral infarction: Secondary | ICD-10-CM | POA: Diagnosis not present

## 2023-08-10 DIAGNOSIS — N186 End stage renal disease: Secondary | ICD-10-CM

## 2023-08-10 DIAGNOSIS — Z66 Do not resuscitate: Secondary | ICD-10-CM | POA: Diagnosis present

## 2023-08-10 DIAGNOSIS — M898X9 Other specified disorders of bone, unspecified site: Secondary | ICD-10-CM | POA: Diagnosis present

## 2023-08-10 DIAGNOSIS — I169 Hypertensive crisis, unspecified: Secondary | ICD-10-CM | POA: Diagnosis present

## 2023-08-10 DIAGNOSIS — R296 Repeated falls: Secondary | ICD-10-CM | POA: Diagnosis present

## 2023-08-10 DIAGNOSIS — J449 Chronic obstructive pulmonary disease, unspecified: Secondary | ICD-10-CM | POA: Diagnosis present

## 2023-08-10 DIAGNOSIS — F1410 Cocaine abuse, uncomplicated: Secondary | ICD-10-CM | POA: Diagnosis present

## 2023-08-10 DIAGNOSIS — R7989 Other specified abnormal findings of blood chemistry: Secondary | ICD-10-CM

## 2023-08-10 DIAGNOSIS — R0602 Shortness of breath: Secondary | ICD-10-CM | POA: Diagnosis present

## 2023-08-10 DIAGNOSIS — I132 Hypertensive heart and chronic kidney disease with heart failure and with stage 5 chronic kidney disease, or end stage renal disease: Secondary | ICD-10-CM | POA: Diagnosis present

## 2023-08-10 DIAGNOSIS — I5023 Acute on chronic systolic (congestive) heart failure: Secondary | ICD-10-CM | POA: Diagnosis present

## 2023-08-10 DIAGNOSIS — Z87891 Personal history of nicotine dependence: Secondary | ICD-10-CM | POA: Diagnosis not present

## 2023-08-10 DIAGNOSIS — Z91013 Allergy to seafood: Secondary | ICD-10-CM | POA: Diagnosis not present

## 2023-08-10 LAB — CBC
HCT: 36 % (ref 36.0–46.0)
Hemoglobin: 11.4 g/dL — ABNORMAL LOW (ref 12.0–15.0)
MCH: 29.9 pg (ref 26.0–34.0)
MCHC: 31.7 g/dL (ref 30.0–36.0)
MCV: 94.5 fL (ref 80.0–100.0)
Platelets: 203 10*3/uL (ref 150–400)
RBC: 3.81 MIL/uL — ABNORMAL LOW (ref 3.87–5.11)
RDW: 21.3 % — ABNORMAL HIGH (ref 11.5–15.5)
WBC: 7.4 10*3/uL (ref 4.0–10.5)
nRBC: 0 % (ref 0.0–0.2)

## 2023-08-10 LAB — TROPONIN I (HIGH SENSITIVITY): Troponin I (High Sensitivity): 57 ng/L — ABNORMAL HIGH (ref <18)

## 2023-08-10 LAB — GLUCOSE, CAPILLARY: Glucose-Capillary: 136 mg/dL — ABNORMAL HIGH (ref 70–99)

## 2023-08-10 LAB — BASIC METABOLIC PANEL
Anion gap: 17 — ABNORMAL HIGH (ref 5–15)
BUN: 56 mg/dL — ABNORMAL HIGH (ref 8–23)
CO2: 20 mmol/L — ABNORMAL LOW (ref 22–32)
Calcium: 8.9 mg/dL (ref 8.9–10.3)
Chloride: 98 mmol/L (ref 98–111)
Creatinine, Ser: 10.11 mg/dL — ABNORMAL HIGH (ref 0.44–1.00)
GFR, Estimated: 4 mL/min — ABNORMAL LOW (ref >60)
Glucose, Bld: 108 mg/dL — ABNORMAL HIGH (ref 70–99)
Potassium: 5.6 mmol/L — ABNORMAL HIGH (ref 3.5–5.1)
Sodium: 135 mmol/L (ref 135–145)

## 2023-08-10 MED ORDER — FENTANYL CITRATE PF 50 MCG/ML IJ SOSY: 12.5000 ug | PREFILLED_SYRINGE | INTRAMUSCULAR | Status: DC | PRN

## 2023-08-10 MED ORDER — ISOSORBIDE MONONITRATE ER 60 MG PO TB24
60.0000 mg | ORAL_TABLET | ORAL | Status: DC
  Administered 2023-08-11: 60 mg via ORAL
  Filled 2023-08-10: qty 1

## 2023-08-10 MED ORDER — HYDRALAZINE HCL 20 MG/ML IJ SOLN
20.0000 mg | Freq: Once | INTRAMUSCULAR | Status: DC
Start: 2023-08-10 — End: 2023-08-10

## 2023-08-10 MED ORDER — NITROGLYCERIN IN D5W 200-5 MCG/ML-% IV SOLN
0.0000 ug/min | INTRAVENOUS | Status: DC
  Administered 2023-08-10: 5 ug/min via INTRAVENOUS
  Filled 2023-08-10: qty 250

## 2023-08-10 MED ORDER — HYDRALAZINE HCL 50 MG PO TABS
100.0000 mg | ORAL_TABLET | Freq: Three times a day (TID) | ORAL | Status: DC
  Administered 2023-08-10 – 2023-08-11 (×3): 100 mg via ORAL
  Filled 2023-08-10 (×3): qty 2

## 2023-08-10 MED ORDER — APIXABAN 2.5 MG PO TABS
2.5000 mg | ORAL_TABLET | Freq: Two times a day (BID) | ORAL | Status: DC
  Administered 2023-08-10 – 2023-08-11 (×2): 2.5 mg via ORAL
  Filled 2023-08-10 (×2): qty 1

## 2023-08-10 MED ORDER — TRIMETHOBENZAMIDE HCL 100 MG/ML IM SOLN: 200.0000 mg | Freq: Four times a day (QID) | INTRAMUSCULAR | Status: DC | PRN

## 2023-08-10 MED ORDER — ACETAMINOPHEN 325 MG PO TABS: 650.0000 mg | ORAL_TABLET | Freq: Four times a day (QID) | ORAL | Status: DC | PRN

## 2023-08-10 MED ORDER — ALBUTEROL SULFATE (2.5 MG/3ML) 0.083% IN NEBU
2.5000 mg | INHALATION_SOLUTION | Freq: Four times a day (QID) | RESPIRATORY_TRACT | Status: DC | PRN
Start: 2023-08-10 — End: 2023-08-11

## 2023-08-10 MED ORDER — CARVEDILOL 25 MG PO TABS
25.0000 mg | ORAL_TABLET | Freq: Two times a day (BID) | ORAL | Status: DC
  Administered 2023-08-10 – 2023-08-11 (×2): 25 mg via ORAL
  Filled 2023-08-10 (×2): qty 2
  Filled 2023-08-10: qty 1

## 2023-08-10 MED ORDER — SODIUM ZIRCONIUM CYCLOSILICATE 10 G PO PACK
10.0000 g | PACK | Freq: Once | ORAL | Status: AC
  Administered 2023-08-10: 10 g via ORAL
  Filled 2023-08-10: qty 1

## 2023-08-10 MED ORDER — AMLODIPINE BESYLATE 5 MG PO TABS
10.0000 mg | ORAL_TABLET | Freq: Once | ORAL | Status: AC
  Administered 2023-08-10: 10 mg via ORAL
  Filled 2023-08-10: qty 2

## 2023-08-10 MED ORDER — CLONIDINE HCL 0.1 MG PO TABS
0.3000 mg | ORAL_TABLET | Freq: Three times a day (TID) | ORAL | Status: DC
  Administered 2023-08-10 – 2023-08-11 (×2): 0.3 mg via ORAL
  Filled 2023-08-10 (×2): qty 3

## 2023-08-10 MED ORDER — AMLODIPINE BESYLATE 10 MG PO TABS
10.0000 mg | ORAL_TABLET | Freq: Every day | ORAL | Status: DC
  Administered 2023-08-11: 10 mg via ORAL
  Filled 2023-08-10: qty 2
  Filled 2023-08-10: qty 1

## 2023-08-10 MED ORDER — ROSUVASTATIN CALCIUM 5 MG PO TABS
10.0000 mg | ORAL_TABLET | Freq: Every day | ORAL | Status: DC
  Administered 2023-08-11: 10 mg via ORAL
  Filled 2023-08-10: qty 2

## 2023-08-10 MED ORDER — HYDRALAZINE HCL 20 MG/ML IJ SOLN
10.0000 mg | Freq: Four times a day (QID) | INTRAMUSCULAR | Status: DC | PRN
  Filled 2023-08-10: qty 1

## 2023-08-10 MED ORDER — ISOSORBIDE MONONITRATE ER 30 MG PO TB24: 30.0000 mg | ORAL_TABLET | Freq: Every day | ORAL | Status: DC

## 2023-08-10 MED ORDER — HEPARIN SODIUM (PORCINE) 1000 UNIT/ML IJ SOLN
INTRAMUSCULAR | Status: AC
  Filled 2023-08-10: qty 4

## 2023-08-10 MED ORDER — PANTOPRAZOLE SODIUM 40 MG PO TBEC
40.0000 mg | DELAYED_RELEASE_TABLET | Freq: Two times a day (BID) | ORAL | Status: DC
  Administered 2023-08-10 – 2023-08-11 (×2): 40 mg via ORAL
  Filled 2023-08-10 (×2): qty 1

## 2023-08-10 MED ORDER — SENNA 8.6 MG PO TABS: 1.0000 | ORAL_TABLET | Freq: Every day | ORAL | Status: DC | PRN

## 2023-08-10 MED ORDER — CARVEDILOL 12.5 MG PO TABS: 25.0000 mg | ORAL_TABLET | Freq: Two times a day (BID) | ORAL | Status: DC

## 2023-08-10 MED ORDER — CLONIDINE HCL 0.1 MG PO TABS
0.1000 mg | ORAL_TABLET | Freq: Three times a day (TID) | ORAL | Status: DC
  Administered 2023-08-10: 0.1 mg via ORAL
  Filled 2023-08-10: qty 1

## 2023-08-10 MED ORDER — ISOSORBIDE MONONITRATE ER 30 MG PO TB24
30.0000 mg | ORAL_TABLET | ORAL | Status: DC
Start: 2023-08-10 — End: 2023-08-11

## 2023-08-10 MED ORDER — HYDRALAZINE HCL 25 MG PO TABS
100.0000 mg | ORAL_TABLET | Freq: Once | ORAL | Status: AC
  Administered 2023-08-10: 100 mg via ORAL
  Filled 2023-08-10: qty 4

## 2023-08-10 MED ORDER — OXYCODONE HCL 5 MG PO TABS
5.0000 mg | ORAL_TABLET | Freq: Four times a day (QID) | ORAL | Status: DC | PRN
  Administered 2023-08-10 (×2): 5 mg via ORAL
  Filled 2023-08-10 (×2): qty 1

## 2023-08-10 MED ORDER — TRAZODONE HCL 50 MG PO TABS: 25.0000 mg | ORAL_TABLET | Freq: Every evening | ORAL | Status: DC | PRN

## 2023-08-10 MED ORDER — HEPARIN SODIUM (PORCINE) 1000 UNIT/ML DIALYSIS: 1000.0000 [IU] | INTRAMUSCULAR | Status: DC | PRN

## 2023-08-10 MED ORDER — LABETALOL HCL 5 MG/ML IV SOLN: 10.0000 mg | INTRAVENOUS | Status: DC | PRN

## 2023-08-10 MED ORDER — ACETAMINOPHEN 650 MG RE SUPP: 650.0000 mg | Freq: Four times a day (QID) | RECTAL | Status: DC | PRN

## 2023-08-10 MED ORDER — CLONIDINE HCL 0.1 MG PO TABS
0.1000 mg | ORAL_TABLET | Freq: Once | ORAL | Status: AC
  Administered 2023-08-10: 0.1 mg via ORAL
  Filled 2023-08-10: qty 1

## 2023-08-10 MED ORDER — SODIUM CHLORIDE 0.9% FLUSH
3.0000 mL | Freq: Two times a day (BID) | INTRAVENOUS | Status: DC
  Administered 2023-08-10 – 2023-08-11 (×3): 3 mL via INTRAVENOUS

## 2023-08-10 MED ORDER — NITROGLYCERIN IN D5W 200-5 MCG/ML-% IV SOLN: 0.0000 ug/min | INTRAVENOUS | Status: DC

## 2023-08-10 NOTE — H&P (Signed)
 History and Physical    [name]:[id number] DOB: 1951/03/10 DOA: 08/09/2023  PCP: Fleet Contras, MD   Patient coming from: Home   Chief Complaint: SOB, fall with back and RUE pain   HPI: Monique Henderson is a 73 y.o. female with medical history significant for hypertension, history of CVA, atrial fibrillation on Eliquis, chronic HFmrEF, and ESRD on hemodialysis who presents with shortness of breath and pain in her back and right arm after a trip and fall at home.  Patient complains of shortness of breath that developed during the day yesterday.  She denies any cough, fever, chills, chest pain, or leg swelling associated with this.  She also complains of pain in her back and right upper extremity that she attributes to a ground-level mechanical fall she had at home.  She denies headache or any new focal numbness or weakness.    ED Course: Upon arrival to the ED, patient is found to be afebrile and saturating 100% on 2 L/min supplemental oxygen with tachypnea, mild tachycardia, and blood pressure of 240/140.  EKG demonstrates a sinus rhythm with PAC, RBBB, LVH, and prolonged QT interval.  Chest x-ray is negative for acute findings.  Labs are most notable for potassium 5.5, normal bicarbonate, BUN 48, normal WBC, normal hemoglobin, and troponin 67.  Nephrology (Dr. Malen Gauze) was consulted by the ED PA and the patient was treated with morphine, Norvasc, clonidine, hydralazine, nitroglycerin ointment, and then nitroglycerin infusion in the ED.  Review of Systems:  All other systems reviewed and apart from HPI, are negative.  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 Chevis Pretty; S/P re-excision of superior and inferior margins 10/27/2012.    Dissection of aorta (HCC) 04/03/2019   ESRD on hemodialysis (HCC) 05/29/2019   Essential hypertension 09/16/2006   GERD 09/16/2006   Hepatitis C    treated and RNA confirmed not detectable 01/2017   Insomnia 03/14/2015   Malnutrition of moderate degree 05/19/2019   Non compliance with medical treatment 12/04/2017   Normocytic anemia    With thrombocytosis   Osteoarthritis    Right ureteral stone 2002   S/P lumbar spinal fusion 01/18/2014   S/P lumbar decompressive laminectomy, fusion, and plating for lumbar spinal stenosis on 05/27/2009 by Dr. Tia Alert.  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 Tia Alert.     Stroke (cerebrum) [facility])    Stroke (HCC) 02/23/2022   SVT (supraventricular tachycardia) (HCC) 08/28/2019   Tobacco use disorder 04/19/2009   Uterine fibroid    Wears dentures  top    Past Surgical History:  Procedure Laterality Date   ANTERIOR LAT LUMBAR FUSION N/A 01/18/2014   Procedure: ANTERIOR LATERAL LUMBAR FUSION LUMBAR TWO-THREE;  Surgeon: Tia Alert, MD;  Location: [facility];  Service: Neurosurgery;  Laterality: N/A;   ANTERIOR LUMBAR FUSION  01/18/2014   AV FISTULA PLACEMENT Left 04/20/2019   Procedure: ARTERIOVENOUS (AV) FISTULA  CREATION;  Surgeon: Maeola Harman, MD;  Location: [facility] OR;  Service: Vascular;  Laterality: Left;   BACK SURGERY     BREAST LUMPECTOMY Left 01/2003   BREAST LUMPECTOMY Right 2014   BREAST LUMPECTOMY WITH NEEDLE LOCALIZATION AND AXILLARY SENTINEL LYMPH NODE BX Right 10/13/2012   Procedure: BREAST LUMPECTOMY WITH NEEDLE LOCALIZATION;  Surgeon: Robyne Askew, MD;  Location: [facility];  Service: General;  Laterality: Right;  Right breast wire localized lumpectomy   DIALYSIS/PERMA CATHETER INSERTION N/A 05/28/2023   Procedure: DIALYSIS/PERMA CATHETER INSERTION;  Surgeon: Ethelene Hal, MD;  Location: [facility];  Service: Cardiovascular;  Laterality: N/A;   INSERTION OF DIALYSIS CATHETER Right 04/20/2019   Procedure: INSERTION OF DIALYSIS CATHETER, right internal jugular;  Surgeon: Maeola Harman, MD;  Location: [facility] OR;  Service: Vascular;  Laterality: Right;   INSERTION OF DIALYSIS CATHETER Right 09/24/2020   Procedure: INSERTION OF TUNNELED DIALYSIS CATHETER;  Surgeon: Larina Earthly, MD;  Location: [facility] OR;  Service: Vascular;  Laterality: Right;   IR FLUORO GUIDE CV LINE RIGHT  09/22/2020   IR THORACENTESIS ASP PLEURAL SPACE W/IMG GUIDE  05/19/2019   IR US GUIDE VASC ACCESS LEFT  09/22/2020   IR US GUIDE VASC ACCESS RIGHT  09/22/2020   IR VENOCAVAGRAM SVC  09/22/2020   LAMINECTOMY  05/27/2009   Lumbar decompressive laminectomy, fusion and plating for lumbar spinal stensosis   LIGATION OF ARTERIOVENOUS  FISTULA Left 09/24/2020   Procedure: LIGATION OF LEFT ARM ARTERIOVENOUS  FISTULA;  Surgeon: Larina Earthly, MD;  Location: [facility] OR;  Service: Vascular;  Laterality: Left;   LUMBAR LAMINECTOMY/DECOMPRESSION MICRODISCECTOMY Left 03/23/2013   Procedure: LUMBAR LAMINECTOMY/DECOMPRESSION MICRODISCECTOMY 1 LEVEL;  Surgeon: Tia Alert, MD;  Location: [facility];  Service: Neurosurgery;  Laterality: Left;  LUMBAR LAMINECTOMY/DECOMPRESSION MICRODISCECTOMY 1 LEVEL    MASTECTOMY, PARTIAL Left 02/26/2003   ; S/P re-excision of cranial and lateral margins 04/19/2003.    RE-EXCISION OF BREAST CANCER,SUPERIOR MARGINS Right 10/27/2012   Procedure: RE-EXCISION OF BREAST CANCER,SUPERIOR and inferior MARGINS;  Surgeon: Robyne Askew, MD;  Location: [facility] OR;  Service: General;  Laterality: Right;   RE-EXCISION OF BREAST LUMPECTOMY Left 04/2003   TEE WITHOUT CARDIOVERSION N/A 04/04/2019   Procedure: Transesophageal Echocardiogram (Tee);  Surgeon: Linden Dolin, MD;  Location: [facility] OR;  Service: Open Heart Surgery;  Laterality: N/A;   THORACIC AORTIC ANEURYSM REPAIR N/A 04/04/2019   Procedure: THORACIC ASCENDING ANEURYSM REPAIR (AAA)  USING 28 MM X 30 CM HEMASHIELD PLATINUM VASCULAR GRAFT;  Surgeon: Linden Dolin, MD;  Location: [facility] OR;  Service: Open Heart Surgery;  Laterality: N/A;    Social History:   reports that she quit smoking about 3 years ago. Her smoking use included cigarettes. She started smoking about 47 years ago. She has a 11 pack-year smoking history. She has never used smokeless tobacco. She reports that she does not currently use alcohol after a past usage of about 2.0 standard drinks of alcohol per week. She reports that she does not currently use drugs after having used the following  drugs: Cocaine.  Allergies  Allergen Reactions   Shrimp [Shellfish Allergy] Shortness Of Breath   Bactroban [Mupirocin] Other (See Comments)    "Sores in nose"   Chlorhexidine Gluconate Itching   Hydrocodone Itching, Nausea And Vomiting and Nausea Only   Latex Itching    Latex gloves cause itchiness and a rash   Tylenol [Acetaminophen] Itching    Takes Percocet at home 05/06/23   Zestril [Lisinopril] Cough    Family History  Problem Relation Age of Onset   Colon cancer Mother 73   Hypertension Mother    Diabetes Sister 56   Hypertension Sister    Diabetes Brother    Hypertension Brother    Diabetes Brother    Hypertension Brother    Kidney disease Son         s/p renal transplant   Hypertension Son    Diabetes Son    Multiple sclerosis Son    Bone cancer Sister 40   Breast cancer Neg Hx    Cervical cancer Neg Hx      Prior to Admission medications   Medication Sig Start Date End Date Taking? Authorizing Provider  albuterol (PROVENTIL) (2.5 MG/3ML) 0.083% nebulizer solution Take 2.5 mg by nebulization every 6 (six) hours as needed for wheezing or shortness of breath.    [provider]  albuterol (VENTOLIN HFA) 108 (90 Base) MCG/ACT inhaler Inhale 2 puffs into the lungs every 6 (six) hours as needed for wheezing or shortness of breath. 07/30/20   Janeece Agee, NP  amLODipine (NORVASC) 10 MG tablet Take 1 tablet (10 mg total) by mouth daily. 03/06/22   Setzer, Lynnell Jude, PA-C  apixaban (ELIQUIS) 2.5 MG TABS tablet Take 1 tablet (2.5 mg total) by mouth 2 (two) times daily. 03/06/22   Setzer, Lynnell Jude, PA-C  carvedilol (COREG) 25 MG tablet Take 1 tablet (25 mg total) by mouth 2 (two) times daily with a meal. 05/01/22 06/16/24  Ghimire, Werner Lean, MD  Cholecalciferol (VITAMIN D-3 PO) Take 1 capsule by mouth daily.    [provider]  cloNIDine (CATAPRES) 0.1 MG tablet Take 1 tablet (0.1 mg total) by mouth 3 (three) times daily. 06/19/23   Hughie Closs, MD  hydrALAZINE (APRESOLINE) 100 MG tablet Take 1 tablet (100 mg total) by mouth every 8 (eight) hours. 12/18/22   Corky Crafts, MD  isosorbide mononitrate (IMDUR) 30 MG 24 hr tablet TAKE 1 TABLET (30MG ) ON DIALYSIS DAYS AND 2 TABLETS (60 MG) ON NON DIALYSIS DAYS. 01/12/23   Corky Crafts, MD  Methoxy PEG-Epoetin Beta (MIRCERA IJ) every 14 (fourteen) days. 07/06/23 07/04/24  [provider]  naloxone [facility]) nasal spray 4 mg/0.1 mL Place 1 spray into the nose as needed (opioid overdose). 02/19/22   [provider]  oxyCODONE-acetaminophen (PERCOCET) 10-325 MG tablet Take 1 tablet by mouth in the morning and at bedtime. 03/12/22   [provider]   pantoprazole (PROTONIX) 40 MG tablet TAKE 1 TABLET (40 MG TOTAL) BY MOUTH 2 (TWO) TIMES DAILY. 05/13/23   Georganna Skeans, MD  rosuvastatin (CRESTOR) 10 MG tablet TAKE 1 TABLET (10 MG TOTAL) BY MOUTH DAILY FOR CHOLESTEROL 08/07/22   Georganna Skeans, MD  senna-docusate (SENOKOT-S) 8.6-50 MG tablet Take 1 tablet by mouth 2 (two) times daily between meals as needed for mild constipation. 08/14/22   Almon Hercules, MD  sevelamer carbonate (RENVELA) 2.4 g PACK Take 2.4 g by mouth 3 (three) times daily with meals. 03/06/22   Setzer, Lynnell Jude,  PA-C  traZODone (DESYREL) 50 MG tablet Take 25-50 mg by mouth at bedtime as needed for sleep. 03/19/23   [provider]    Physical Exam: Vitals:   08/10/23 0130 08/10/23 0136 08/10/23 0200 08/10/23 0215  BP: (!) 231/141 (!) 227/137 (!) 201/116 (!) 185/107  Pulse:  99    Resp: (!) 32 (!) 24 (!) 29 (!) 25  Temp:   98.3 F (36.8 C)   TempSrc:   Axillary   SpO2:  99% 100%     Constitutional: NAD, no pallor or diaphoresis  Eyes: PERTLA, lids and conjunctivae normal ENMT: Mucous membranes are moist. Posterior pharynx clear of any exudate or lesions.   Neck: supple, no masses  Respiratory: Speaking full sentences. Tachypneic. No wheezing.  Cardiovascular: S1 & S2 heard, regular rate and rhythm. JVD. Abdomen: No tenderness, soft. Bowel sounds active.  Musculoskeletal: no clubbing / cyanosis. No joint deformity upper and lower extremities.   Skin: no significant rashes, lesions, ulcers. Warm, dry, well-perfused. Neurologic: CN 2-12 grossly intact. Moving all extremities. Alert and oriented.  Psychiatric: Calm. Cooperative.    Labs and Imaging on Admission: I have personally reviewed following labs and imaging studies  CBC: Recent Labs  Lab 08/09/23 2101  WBC 6.8  HGB 12.4  HCT 40.2  MCV 97.6  PLT 215   Basic Metabolic Panel: Recent Labs  Lab 08/09/23 2101  NA 139  K 5.5*  CL 95*  CO2 25  GLUCOSE 104*  BUN 48*  CREATININE 9.46*   CALCIUM 9.8   GFR: CrCl cannot be calculated (Unknown ideal weight.). Liver Function Tests: No results for input(s): "AST", "ALT", "ALKPHOS", "BILITOT", "PROT", "ALBUMIN" in the last 168 hours. No results for input(s): "LIPASE", "AMYLASE" in the last 168 hours. No results for input(s): "AMMONIA" in the last 168 hours. Coagulation Profile: No results for input(s): "INR", "PROTIME" in the last 168 hours. Cardiac Enzymes: No results for input(s): "CKTOTAL", "CKMB", "CKMBINDEX", "TROPONINI" in the last 168 hours. BNP (last 3 results) No results for input(s): "PROBNP" in the last 8760 hours. HbA1C: No results for input(s): "HGBA1C" in the last 72 hours. CBG: No results for input(s): "GLUCAP" in the last 168 hours. Lipid Profile: No results for input(s): "CHOL", "HDL", "LDLCALC", "TRIG", "CHOLHDL", "LDLDIRECT" in the last 72 hours. Thyroid Function Tests: No results for input(s): "TSH", "T4TOTAL", "FREET4", "T3FREE", "THYROIDAB" in the last 72 hours. Anemia Panel: No results for input(s): "VITAMINB12", "FOLATE", "FERRITIN", "TIBC", "IRON", "RETICCTPCT" in the last 72 hours. Urine analysis:    Component Value Date/Time   COLORURINE AMBER (A) 12/27/2021 1840   APPEARANCEUR TURBID (A) 12/27/2021 1840   LABSPEC 1.013 12/27/2021 1840   PHURINE 8.0 12/27/2021 1840   GLUCOSEU NEGATIVE 12/27/2021 1840   GLUCOSEU NEG mg/dL 16/03/9603 5409   HGBUR MODERATE (A) 12/27/2021 1840   BILIRUBINUR NEGATIVE 12/27/2021 1840   BILIRUBINUR negative 05/04/2018 0910   KETONESUR NEGATIVE 12/27/2021 1840   PROTEINUR >=300 (A) 12/27/2021 1840   UROBILINOGEN 0.2 05/04/2018 0910   UROBILINOGEN 1 02/14/2014 0946   NITRITE NEGATIVE 12/27/2021 1840   LEUKOCYTESUR LARGE (A) 12/27/2021 1840   Sepsis Labs: @LABRCNTIP (procalcitonin:4,lacticidven:4) )No results found for this or any previous visit (from the past 240 hours).   Radiological Exams on Admission: DG Elbow Complete Right Result Date:  08/10/2023 CLINICAL DATA:  Elbow pain post fall rt humerus performed earlier Pain post fall EXAM: RIGHT ELBOW - COMPLETE 3+ VIEW COMPARISON:  None Available. FINDINGS: There is no evidence of fracture, dislocation, or joint effusion.  There is no evidence of severe arthropathy or other focal bone abnormality. Soft tissues are unremarkable. IMPRESSION: No acute displaced fracture or dislocation. Electronically Signed   By: Tish Frederickson M.D.   On: 08/10/2023 00:26   CT Cervical Spine Wo Contrast Result Date: 08/10/2023 CLINICAL DATA:  Neck trauma (Age >= 65y); Back trauma, no prior imaging (Age >= 16y) Fall onto back EXAM: CT CERVICAL, THORACIC, AND LUMBAR SPINE WITHOUT CONTRAST TECHNIQUE: Multidetector CT imaging of the cervical, thoracic and lumbar spine was performed without intravenous contrast. Multiplanar CT image reconstructions were also generated. RADIATION DOSE REDUCTION: This exam was performed according to the departmental dose-optimization program which includes automated exposure control, adjustment of the mA and/or kV according to patient size and/or use of iterative reconstruction technique. COMPARISON:  Chest x-ray 08/09/2023 FINDINGS: CT CERVICAL SPINE FINDINGS Alignment: Normal. Skull base and vertebrae: Multilevel severe degenerative changes of the spine. No associated severe osseous foraminal or central canal stenosis. No acute fracture. No aggressive appearing focal osseous lesion or focal pathologic process. Soft tissues and spinal canal: No prevertebral fluid or swelling. No visible canal hematoma. Other: None. CT THORACIC SPINE FINDINGS Alignment: Normal. Vertebrae: Multilevel moderate degenerative changes of the spine. No associated severe osseous neural foraminal or central canal stenosis. No acute fracture or focal pathologic process. Paraspinal and other soft tissues: Negative. Disc levels: Maintained. CT LUMBAR SPINE FINDINGS Segmentation: 5 lumbar type vertebrae. Alignment: Normal.  Vertebrae: No acute fracture or focal pathologic process. L2-L3 left and interbody surgical hardware fusion. L4-L5 multilevel moderate degenerative changes of the spine. No CT evidence of surgical hardware complication. No associated severe osseous neural foraminal or central canal stenosis. Posterior element surgical hardware. Paraspinal and other soft tissues: Negative. Disc levels: Multilevel intervertebral disc space vacuum phenomenon. Other: Trace to small right pleural effusion. Limited evaluation of the lungs due to motion artifact. Diffuse bronchial wall thickening. Question developing peribronchovascular ground-glass airspace opacities. Cardiomegaly. 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 Severe atherosclerotic plaque. Right chest wall dialysis catheter with tip within the right atrium in the region of the inferior cavoatrial junction. Calcified uterine fibroids. IMPRESSION: 1. No acute displaced fracture or traumatic listhesis of the cervical, thoracic, lumbar spine. 2. Trace to small right pleural effusion. Not well visualized on chest x-ray 08/09/2023. 3. Diffuse bronchial wall thickening. Question developing peribronchovascular ground-glass airspace opacities. Limited evaluation due to motion artifact. Developing infection/inflammation not excluded. 4.  Aortic Atherosclerosis (ICD10-I70.0). 5. 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 Recommend annual imaging followup by CTA or MRA. This recommendation follows 2010 ACCF/AHA/AATS/ACR/ASA/SCA/SCAI/SIR/STS/SVM Guidelines for the Diagnosis and Management of Patients with Thoracic Aortic Disease. Circulation. 2010; 121: W737-T062. Aortic aneurysm NOS (ICD10-I71.9). Electronically Signed   By: Tish Frederickson M.D.   On: 08/10/2023 00:25   CT Thoracic Spine Wo Contrast Result Date: 08/10/2023 CLINICAL DATA:  Neck trauma (Age >= 65y); Back trauma, no prior imaging (Age >= 16y) Fall onto back EXAM: CT CERVICAL, THORACIC, AND LUMBAR  SPINE WITHOUT CONTRAST TECHNIQUE: Multidetector CT imaging of the cervical, thoracic and lumbar spine was performed without intravenous contrast. Multiplanar CT image reconstructions were also generated. RADIATION DOSE REDUCTION: This exam was performed according to the departmental dose-optimization program which includes automated exposure control, adjustment of the mA and/or kV according to patient size and/or use of iterative reconstruction technique. COMPARISON:  Chest x-ray 08/09/2023 FINDINGS: CT CERVICAL SPINE FINDINGS Alignment: Normal. Skull base and vertebrae: Multilevel severe degenerative changes of the spine. No associated severe osseous foraminal or central canal  stenosis. No acute fracture. No aggressive appearing focal osseous lesion or focal pathologic process. Soft tissues and spinal canal: No prevertebral fluid or swelling. No visible canal hematoma. Other: None. CT THORACIC SPINE FINDINGS Alignment: Normal. Vertebrae: Multilevel moderate degenerative changes of the spine. No associated severe osseous neural foraminal or central canal stenosis. No acute fracture or focal pathologic process. Paraspinal and other soft tissues: Negative. Disc levels: Maintained. CT LUMBAR SPINE FINDINGS Segmentation: 5 lumbar type vertebrae. Alignment: Normal. Vertebrae: No acute fracture or focal pathologic process. L2-L3 left and interbody surgical hardware fusion. L4-L5 multilevel moderate degenerative changes of the spine. No CT evidence of surgical hardware complication. No associated severe osseous neural foraminal or central canal stenosis. Posterior element surgical hardware. Paraspinal and other soft tissues: Negative. Disc levels: Multilevel intervertebral disc space vacuum phenomenon. Other: Trace to small right pleural effusion. Limited evaluation of the lungs due to motion artifact. Diffuse bronchial wall thickening. Question developing peribronchovascular ground-glass airspace opacities. Cardiomegaly.  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 Severe atherosclerotic plaque. Right chest wall dialysis catheter with tip within the right atrium in the region of the inferior cavoatrial junction. Calcified uterine fibroids. IMPRESSION: 1. No acute displaced fracture or traumatic listhesis of the cervical, thoracic, lumbar spine. 2. Trace to small right pleural effusion. Not well visualized on chest x-ray 08/09/2023. 3. Diffuse bronchial wall thickening. Question developing peribronchovascular ground-glass airspace opacities. Limited evaluation due to motion artifact. Developing infection/inflammation not excluded. 4.  Aortic Atherosclerosis (ICD10-I70.0). 5. 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 Recommend annual imaging followup by CTA or MRA. This recommendation follows 2010 ACCF/AHA/AATS/ACR/ASA/SCA/SCAI/SIR/STS/SVM Guidelines for the Diagnosis and Management of Patients with Thoracic Aortic Disease. Circulation. 2010; 121: Q657-Q469. Aortic aneurysm NOS (ICD10-I71.9). Electronically Signed   By: Tish Frederickson M.D.   On: 08/10/2023 00:25   CT Lumbar Spine Wo Contrast Result Date: 08/10/2023 CLINICAL DATA:  Neck trauma (Age >= 65y); Back trauma, no prior imaging (Age >= 16y) Fall onto back EXAM: CT CERVICAL, THORACIC, AND LUMBAR SPINE WITHOUT CONTRAST TECHNIQUE: Multidetector CT imaging of the cervical, thoracic and lumbar spine was performed without intravenous contrast. Multiplanar CT image reconstructions were also generated. RADIATION DOSE REDUCTION: This exam was performed according to the departmental dose-optimization program which includes automated exposure control, adjustment of the mA and/or kV according to patient size and/or use of iterative reconstruction technique. COMPARISON:  Chest x-ray 08/09/2023 FINDINGS: CT CERVICAL SPINE FINDINGS Alignment: Normal. Skull base and vertebrae: Multilevel severe degenerative changes of the spine. No associated severe osseous foraminal or  central canal stenosis. No acute fracture. No aggressive appearing focal osseous lesion or focal pathologic process. Soft tissues and spinal canal: No prevertebral fluid or swelling. No visible canal hematoma. Other: None. CT THORACIC SPINE FINDINGS Alignment: Normal. Vertebrae: Multilevel moderate degenerative changes of the spine. No associated severe osseous neural foraminal or central canal stenosis. No acute fracture or focal pathologic process. Paraspinal and other soft tissues: Negative. Disc levels: Maintained. CT LUMBAR SPINE FINDINGS Segmentation: 5 lumbar type vertebrae. Alignment: Normal. Vertebrae: No acute fracture or focal pathologic process. L2-L3 left and interbody surgical hardware fusion. L4-L5 multilevel moderate degenerative changes of the spine. No CT evidence of surgical hardware complication. No associated severe osseous neural foraminal or central canal stenosis. Posterior element surgical hardware. Paraspinal and other soft tissues: Negative. Disc levels: Multilevel intervertebral disc space vacuum phenomenon. Other: Trace to small right pleural effusion. Limited evaluation of the lungs due to motion artifact. Diffuse bronchial wall thickening. Question developing peribronchovascular ground-glass  airspace opacities. Cardiomegaly. 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 Severe atherosclerotic plaque. Right chest wall dialysis catheter with tip within the right atrium in the region of the inferior cavoatrial junction. Calcified uterine fibroids. IMPRESSION: 1. No acute displaced fracture or traumatic listhesis of the cervical, thoracic, lumbar spine. 2. Trace to small right pleural effusion. Not well visualized on chest x-ray 08/09/2023. 3. Diffuse bronchial wall thickening. Question developing peribronchovascular ground-glass airspace opacities. Limited evaluation due to motion artifact. Developing infection/inflammation not excluded. 4.  Aortic Atherosclerosis  (ICD10-I70.0). 5. 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 Recommend annual imaging followup by CTA or MRA. This recommendation follows 2010 ACCF/AHA/AATS/ACR/ASA/SCA/SCAI/SIR/STS/SVM Guidelines for the Diagnosis and Management of Patients with Thoracic Aortic Disease. Circulation. 2010; 121: M841-L244. Aortic aneurysm NOS (ICD10-I71.9). Electronically Signed   By: Tish Frederickson M.D.   On: 08/10/2023 00:25   CT Head Wo Contrast Result Date: 08/10/2023 CLINICAL DATA:  Head trauma, minor (Age >= 65y).  Fall onto back. EXAM: CT HEAD WITHOUT CONTRAST TECHNIQUE: Contiguous axial images were obtained from the base of the skull through the vertex without intravenous contrast. RADIATION DOSE REDUCTION: This exam was performed according to the departmental dose-optimization program which includes automated exposure control, adjustment of the mA and/or kV according to patient size and/or use of iterative reconstruction technique. COMPARISON:  CT head 06/25/2023 FINDINGS: Brain: Cerebral ventricle sizes are concordant with the degree of cerebral volume loss. Chronic extensive patchy and confluent areas of decreased attenuation are noted throughout the deep and periventricular white matter of the cerebral hemispheres bilaterally, compatible with chronic microvascular ischemic disease. Left frontoparietal encephalomalacia. No evidence of large-territorial acute infarction. No parenchymal hemorrhage. No mass lesion. No extra-axial collection. No mass effect or midline shift. No hydrocephalus. Basilar cisterns are patent. Vascular: No hyperdense vessel. Atherosclerotic calcifications are present within the cavernous internal carotid arteries. Skull: No acute fracture or focal lesion. Sinuses/Orbits: Paranasal sinuses and mastoid air cells are clear. The orbits are unremarkable. Other: None. IMPRESSION: No acute intracranial abnormality. Electronically Signed   By: Tish Frederickson M.D.   On: 08/10/2023 00:10   DG  Humerus Right Result Date: 08/09/2023 CLINICAL DATA:  [id number] Arm pain 01027 ShOB x 1 hour and hypertension. Pt is a dialysis pt with last full treatment on Saturday. Hx of stroke with R sided deficits." Best images obtainable d/t pt condition. EXAM: RIGHT HUMERUS - 2+ VIEW COMPARISON:  None Available. FINDINGS: Likely elbow joint effusion. There is no evidence of fracture or other focal bone lesions. Soft tissues are unremarkable. IMPRESSION: 1. Suggestion of an elbow joint effusion. Recommend dedicated 3 view radiograph of the elbow. 2.  No acute displaced fracture of the humerus. Electronically Signed   By: Tish Frederickson M.D.   On: 08/09/2023 23:24   DG Chest 2 View Result Date: 08/09/2023 CLINICAL DATA:  shob EXAM: CHEST - 2 VIEW COMPARISON:  CT chest 01/14/2023, chest x-ray 07/22/2023 FINDINGS: Right chest wall dialysis catheter with tip overlying the right atrium in the region of the inferior caval junction. Cardiomegaly. The heart and mediastinal contours are unchanged. Atherosclerotic plaque. Lingular atelectasis. No focal consolidation. Chronic coarse markings with no overt pulmonary edema. No pleural effusion. No pneumothorax. No acute osseous abnormality.  Sternotomy wires are intact. IMPRESSION: 1. No active cardiopulmonary disease. 2. Aortic Atherosclerosis (ICD10-I70.0) and Emphysema (ICD10-J43.9). Electronically Signed   By: Tish Frederickson M.D.   On: 08/09/2023 23:21    EKG: Independently reviewed. Sinus rhythm, PAC, RBBB, LVH.   Assessment/Plan  1. Hypertensive crisis  - Given her home medications and then started on nitroglycerin infusion in ED   - Anticipate improvement with dialysis  - Continue Norvasc, clonidine, hydralazine, and Coreg, continue nitroglycerin infusion for now for goal BP </= 180/110 for tonight    2. ESRD; hyperkalemia; hypervolemia  - Nephrology consulted by ED PA  - Restrict fluids, renally-dose medications, repeat chem panel in am   3. 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 PAF  - Eliquis, Coreg   6. Elevated troponin  - Mildly elevated and decreasing; no chest pain  - Likely reflects demand ischemia in setting of severe HTN and hypervolemia - Treat BP and volume as above   7. Hx of CVA  - Eliquis, Crestor   8. COPD  - Not in exacerbation  - Bronchodilators    DVT prophylaxis: Eliquis  Code Status: DNR  Level of Care: Level of care: Progressive Family Communication: None present   Disposition Plan:  Patient is from: Home  Anticipated d/c is to: TBD Anticipated d/c date is: 08/13/23  Patient currently: Pending BP-control, pain-control, improved volume status  Consults called: Nephrology  Admission status: Inpatient     Briscoe Deutscher, MD [facility]  08/10/2023, 2:52 AM

## 2023-08-10 NOTE — ED Notes (Signed)
 CCM noted on monitor after patient returned from CT. EKG done.

## 2023-08-10 NOTE — Progress Notes (Signed)
   08/10/23 1420  Vitals  Temp 97.8 F (36.6 C)  Pulse Rate 90  Resp (!) 30  BP (!) 203/118  SpO2 100 %  O2 Device Nasal Cannula  Weight  (unable to obtain due to pt in stretcher)  Oxygen Therapy  O2 Flow Rate (L/min) 2 L/min  Patient Activity (if Appropriate) In bed  Pulse Oximetry Type Continuous  Oximetry Probe Site Changed No  Post Treatment  Dialyzer Clearance Lightly streaked  Liters Processed 84  Fluid Removed (mL) 2000 mL  Tolerated HD Treatment Yes  Post-Hemodialysis Comments Pt to receive BP medication upon return to ED.   Received patient in bed to unit.  Alert and oriented.  Informed consent signed and in chart.   TX duration: 3 hours and 30 minutes.  Patient tolerated well.  Transported back to the room  Alert, without acute distress.  Hand-off given to patient's nurse.   Access used: Right chest catheter Access issues: none  Medication(s) given: oxycodone 5mg

## 2023-08-10 NOTE — ED Notes (Signed)
 MD notified RN that nitroglycerin drip should be d/c so pt can receive dialysis as scheduled

## 2023-08-10 NOTE — Consult Note (Signed)
 [facility] Renal Consultation Note    Indication for Consultation:  Management of ESRD/hemodialysis; anemia, hypertension/volume and secondary hyperparathyroidism   HPI: Monique Henderson is a 73 y.o. female with PMH of ESRD on HD TTS, HTN, PAFib, prior CVA, HFrEF who is admitted with hypertensive urgency. Presented with SOB and back pain after a fall at home. Blood pressure 240/140 on admission. No overt pulmonary edema on CXR. BP meds resumed in the ED. Nephrology consulted for dialysis.  She hasn't missed any dialysis this month.  Last dialysis was Saturday 3/8 but ended 30 mins early. Left 0.5kg over dry weight.  Seen and examined in [facility]. Starting dialysis with SBP >200. UFG 2.5L. Denies chest pain, sob, nausea/vomiting. Only complaint is that her back is hurting.  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 Chevis Pretty; S/P re-excision of superior and inferior margins 10/27/2012.    Dissection of aorta (HCC) 04/03/2019   ESRD on hemodialysis (HCC) 05/29/2019   Essential hypertension 09/16/2006   GERD 09/16/2006   Hepatitis C    treated and RNA confirmed not detectable 01/2017   Insomnia 03/14/2015    Malnutrition of moderate degree 05/19/2019   Non compliance with medical treatment 12/04/2017   Normocytic anemia    With thrombocytosis   Osteoarthritis    Right ureteral stone 2002   S/P lumbar spinal fusion 01/18/2014   S/P lumbar decompressive laminectomy, fusion, and plating for lumbar spinal stenosis on 05/27/2009 by Dr. Tia Alert.  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 Tia Alert.     Stroke (cerebrum) (HCC)    Stroke (HCC) 02/23/2022   SVT (supraventricular tachycardia) (HCC) 08/28/2019   Tobacco use disorder 04/19/2009   Uterine fibroid    Wears dentures    top   Past Surgical History:  Procedure Laterality Date   ANTERIOR LAT LUMBAR FUSION N/A 01/18/2014   Procedure: ANTERIOR LATERAL LUMBAR FUSION LUMBAR TWO-THREE;  Surgeon: Tia Alert, MD;  Location: [facility];  Service: Neurosurgery;  Laterality: N/A;   ANTERIOR LUMBAR FUSION  01/18/2014   AV FISTULA PLACEMENT Left 04/20/2019   Procedure: ARTERIOVENOUS (AV) FISTULA CREATION;  Surgeon: Maeola Harman, MD;  Location: [facility] OR;  Service: Vascular;  Laterality: Left;   BACK SURGERY     BREAST LUMPECTOMY Left 01/2003   BREAST LUMPECTOMY Right 2014   BREAST LUMPECTOMY WITH NEEDLE LOCALIZATION AND AXILLARY SENTINEL LYMPH NODE BX Right 10/13/2012   Procedure: BREAST LUMPECTOMY WITH NEEDLE LOCALIZATION;  Surgeon: Robyne Askew, MD;  Location: [facility];  Service: General;  Laterality: Right;  Right breast wire localized lumpectomy   DIALYSIS/PERMA CATHETER INSERTION N/A 05/28/2023   Procedure: DIALYSIS/PERMA CATHETER INSERTION;  Surgeon: Ethelene Hal, MD;  Location: [facility] INVASIVE CV LAB;  Service: Cardiovascular;  Laterality: N/A;   INSERTION OF DIALYSIS CATHETER Right 04/20/2019   Procedure: INSERTION  OF DIALYSIS CATHETER, right internal jugular;  Surgeon: Maeola Harman, MD;  Location: [facility] OR;  Service: Vascular;  Laterality: Right;   INSERTION OF DIALYSIS CATHETER Right 09/24/2020   Procedure: INSERTION OF TUNNELED DIALYSIS CATHETER;  Surgeon: Larina Earthly, MD;  Location: [facility] OR;  Service: Vascular;  Laterality: Right;   IR FLUORO GUIDE CV LINE RIGHT  09/22/2020   IR THORACENTESIS ASP PLEURAL SPACE W/IMG GUIDE  05/19/2019   IR US GUIDE VASC ACCESS LEFT  09/22/2020   IR US GUIDE VASC ACCESS RIGHT  09/22/2020   IR VENOCAVAGRAM SVC  09/22/2020   LAMINECTOMY  05/27/2009   Lumbar decompressive laminectomy, fusion and plating for lumbar spinal stensosis   LIGATION OF ARTERIOVENOUS  FISTULA Left 09/24/2020   Procedure: LIGATION OF LEFT ARM ARTERIOVENOUS  FISTULA;  Surgeon: Larina Earthly, MD;  Location: [facility] OR;  Service: Vascular;  Laterality: Left;   LUMBAR LAMINECTOMY/DECOMPRESSION MICRODISCECTOMY Left 03/23/2013   Procedure: LUMBAR LAMINECTOMY/DECOMPRESSION MICRODISCECTOMY 1 LEVEL;  Surgeon: Tia Alert, MD;  Location: [facility];  Service: Neurosurgery;  Laterality: Left;  LUMBAR LAMINECTOMY/DECOMPRESSION MICRODISCECTOMY 1 LEVEL   MASTECTOMY, PARTIAL Left 02/26/2003   ; S/P re-excision of cranial and lateral margins 04/19/2003.    RE-EXCISION OF BREAST CANCER,SUPERIOR MARGINS Right 10/27/2012   Procedure: RE-EXCISION OF BREAST CANCER,SUPERIOR and inferior MARGINS;  Surgeon: Robyne Askew, MD;  Location: [facility] OR;  Service: General;  Laterality: Right;   RE-EXCISION OF BREAST LUMPECTOMY Left 04/2003   TEE WITHOUT CARDIOVERSION N/A 04/04/2019   Procedure: Transesophageal Echocardiogram (Tee);  Surgeon: Linden Dolin, MD;  Location: [facility] OR;  Service: Open Heart Surgery;  Laterality: N/A;   THORACIC AORTIC ANEURYSM REPAIR N/A 04/04/2019   Procedure: THORACIC ASCENDING ANEURYSM REPAIR (AAA)  USING 28 MM X 30 CM HEMASHIELD PLATINUM VASCULAR GRAFT;  Surgeon: Linden Dolin, MD;  Location: [facility]  OR;  Service: Open Heart Surgery;  Laterality: N/A;   Family History  Problem Relation Age of Onset   Colon cancer Mother 22   Hypertension Mother    Diabetes Sister 43   Hypertension Sister    Diabetes Brother    Hypertension Brother    Diabetes Brother    Hypertension Brother    Kidney disease Son        s/p renal transplant   Hypertension Son    Diabetes Son    Multiple sclerosis Son    Bone cancer Sister 37   Breast cancer Neg Hx    Cervical cancer Neg Hx    Social History:  reports that she quit smoking about 3 years ago. Her smoking use included cigarettes. She started smoking about 47 years ago. She has a 11 pack-year smoking history. She has never used smokeless tobacco. She reports that she does not currently use alcohol after a past usage of about 2.0 standard drinks of alcohol per week. She reports that she does not currently use drugs after having used the following drugs: Cocaine. Allergies  Allergen Reactions   Shrimp [Shellfish Allergy] Shortness Of Breath   Bactroban [Mupirocin] Other (See Comments)    "Sores in nose"   Chlorhexidine Gluconate Itching   Hydrocodone Itching, Nausea  And Vomiting and Nausea Only   Latex Itching    Latex gloves cause itchiness and a rash   Tylenol [Acetaminophen] Itching    Takes Percocet at home 05/06/23   Zestril [Lisinopril] Cough   Prior to Admission medications   Medication Sig Start Date End Date Taking? Authorizing Provider  LOKELMA 10 g PACK packet Take by mouth. 07/30/23  Yes [provider]  albuterol (PROVENTIL) (2.5 MG/3ML) 0.083% nebulizer solution Take 2.5 mg by nebulization every 6 (six) hours as needed for wheezing or shortness of breath.    [provider]  albuterol (VENTOLIN HFA) 108 (90 Base) MCG/ACT inhaler Inhale 2 puffs into the lungs every 6 (six) hours as needed for wheezing or shortness of breath. 07/30/20   Janeece Agee, NP  amLODipine (NORVASC) 10 MG tablet Take 1 tablet (10 mg total)  by mouth daily. 03/06/22   Setzer, Lynnell Jude, PA-C  apixaban (ELIQUIS) 2.5 MG TABS tablet Take 1 tablet (2.5 mg total) by mouth 2 (two) times daily. 03/06/22   Setzer, Lynnell Jude, PA-C  carvedilol (COREG) 25 MG tablet Take 1 tablet (25 mg total) by mouth 2 (two) times daily with a meal. 05/01/22 06/16/24  Ghimire, Werner Lean, MD  Cholecalciferol (VITAMIN D-3 PO) Take 1 capsule by mouth daily.    [provider]  cloNIDine (CATAPRES) 0.1 MG tablet Take 1 tablet (0.1 mg total) by mouth 3 (three) times daily. 06/19/23   Hughie Closs, MD  hydrALAZINE (APRESOLINE) 100 MG tablet Take 1 tablet (100 mg total) by mouth every 8 (eight) hours. 12/18/22   Corky Crafts, MD  isosorbide mononitrate (IMDUR) 30 MG 24 hr tablet TAKE 1 TABLET (30MG ) ON DIALYSIS DAYS AND 2 TABLETS (60 MG) ON NON DIALYSIS DAYS. 01/12/23   Corky Crafts, MD  Methoxy PEG-Epoetin Beta (MIRCERA IJ) every 14 (fourteen) days. 07/06/23 07/04/24  [provider]  naloxone [facility]) nasal spray 4 mg/0.1 mL Place 1 spray into the nose as needed (opioid overdose). 02/19/22   [provider]  oxyCODONE-acetaminophen (PERCOCET) 10-325 MG tablet Take 1 tablet by mouth in the morning and at bedtime. 03/12/22   [provider]  pantoprazole (PROTONIX) 40 MG tablet TAKE 1 TABLET (40 MG TOTAL) BY MOUTH 2 (TWO) TIMES DAILY. 05/13/23   Georganna Skeans, MD  rosuvastatin (CRESTOR) 10 MG tablet TAKE 1 TABLET (10 MG TOTAL) BY MOUTH DAILY FOR CHOLESTEROL 08/07/22   Georganna Skeans, MD  senna-docusate (SENOKOT-S) 8.6-50 MG tablet Take 1 tablet by mouth 2 (two) times daily between meals as needed for mild constipation. 08/14/22   Almon Hercules, MD  sevelamer carbonate (RENVELA) 2.4 g PACK Take 2.4 g by mouth 3 (three) times daily with meals. 03/06/22   Setzer, Lynnell Jude, PA-C  traZODone (DESYREL) 50 MG tablet Take 25-50 mg by mouth at bedtime as needed for sleep. 03/19/23   [provider]   Current Facility-Administered  Medications  Medication Dose Route Frequency Provider Last Rate Last Admin   acetaminophen (TYLENOL) tablet 650 mg  650 mg Oral Q6H PRN Opyd, Lavone Neri, MD       Or   acetaminophen (TYLENOL) suppository 650 mg  650 mg Rectal Q6H PRN Opyd, Lavone Neri, MD       albuterol (PROVENTIL) (2.5 MG/3ML) 0.083% nebulizer solution 2.5 mg  2.5 mg Nebulization Q6H PRN Opyd, Lavone Neri, MD       [START ON 08/11/2023] amLODipine (NORVASC) tablet 10 mg  10 mg Oral Daily Opyd, Lavone Neri, MD  apixaban (ELIQUIS) tablet 2.5 mg  2.5 mg Oral BID Opyd, Lavone Neri, MD       carvedilol (COREG) tablet 25 mg  25 mg Oral BID WC Opyd, Lavone Neri, MD       cloNIDine (CATAPRES) tablet 0.1 mg  0.1 mg Oral TID Opyd, Lavone Neri, MD       fentaNYL (SUBLIMAZE) injection 12.5-50 mcg  12.5-50 mcg Intravenous Q2H PRN Opyd, Lavone Neri, MD       hydrALAZINE (APRESOLINE) tablet 100 mg  100 mg Oral Q8H Opyd, Lavone Neri, MD       isosorbide mononitrate (IMDUR) 24 hr tablet 30 mg  30 mg Oral Once per day on Tuesday Thursday Saturday Briscoe Deutscher, MD       And   [START ON 08/11/2023] isosorbide mononitrate (IMDUR) 24 hr tablet 60 mg  60 mg Oral Once per day on Sunday Monday Wednesday Friday Opyd, Lavone Neri, MD       oxyCODONE (Oxy IR/ROXICODONE) immediate release tablet 5 mg  5 mg Oral Q6H PRN Opyd, Lavone Neri, MD   5 mg at 08/10/23 0519   pantoprazole (PROTONIX) EC tablet 40 mg  40 mg Oral BID Opyd, Lavone Neri, MD       rosuvastatin (CRESTOR) tablet 10 mg  10 mg Oral Daily Opyd, Lavone Neri, MD       senna (SENOKOT) tablet 8.6 mg  1 tablet Oral Daily PRN Opyd, Lavone Neri, MD       sodium chloride flush (NS) 0.9 % injection 3 mL  3 mL Intravenous Q12H Opyd, Lavone Neri, MD   3 mL at 08/10/23 0929   traZODone (DESYREL) tablet 25-50 mg  25-50 mg Oral QHS PRN Opyd, Lavone Neri, MD       trimethobenzamide (TIGAN) injection 200 mg  200 mg Intramuscular Q6H PRN Opyd, Lavone Neri, MD         ROS: As per HPI otherwise negative.  Physical Exam: Vitals:    08/10/23 0630 08/10/23 1010 08/10/23 1030 08/10/23 1100  BP: (!) 191/114 (!) 193/115 (!) 187/107 (!) 187/112  Pulse: 90 87 89 82  Resp: 18 (!) 23 (!) 25 (!) 23  Temp:  98.4 F (36.9 C)    TempSrc:      SpO2: 99% 99% 98% 100%     General: Chronically ill appearing, nad  Head: NCAT sclera not icteric MMM Neck: Supple. No JVD appreciated  Lungs: Clear, normal wob  Heart: RRR,  Abdomen: soft =no masses  Lower extremities:without edema  Neuro: A & O X 3. Moves all extremities spontaneously. Psych:  Responds to questions appropriately with a normal affect. Dialysis Access: [facility]   Labs: Basic Metabolic Panel: Recent Labs  Lab 08/09/23 2101 08/10/23 0506  NA 139 135  K 5.5* 5.6*  CL 95* 98  CO2 25 20*  GLUCOSE 104* 108*  BUN 48* 56*  CREATININE 9.46* 10.11*  CALCIUM 9.8 8.9   Liver Function Tests: No results for input(s): "AST", "ALT", "ALKPHOS", "BILITOT", "PROT", "ALBUMIN" in the last 168 hours. No results for input(s): "LIPASE", "AMYLASE" in the last 168 hours. No results for input(s): "AMMONIA" in the last 168 hours. CBC: Recent Labs  Lab 08/09/23 2101 08/10/23 0506  WBC 6.8 7.4  HGB 12.4 11.4*  HCT 40.2 36.0  MCV 97.6 94.5  PLT 215 203   Cardiac Enzymes: No results for input(s): "CKTOTAL", "CKMB", "CKMBINDEX", "TROPONINI" in the last 168 hours. CBG: No results for input(s): "GLUCAP" in the last 168 hours.  Iron Studies: No results for input(s): "IRON", "TIBC", "TRANSFERRIN", "FERRITIN" in the last 72 hours. Studies/Results: DG Elbow Complete Right Result Date: 08/10/2023 CLINICAL DATA:  Elbow pain post fall rt humerus performed earlier Pain post fall EXAM: RIGHT ELBOW - COMPLETE 3+ VIEW COMPARISON:  None Available. FINDINGS: There is no evidence of fracture, dislocation, or joint effusion. There is no evidence of severe arthropathy or other focal bone abnormality. Soft tissues are unremarkable. IMPRESSION: No acute displaced fracture or dislocation. Electronically  Signed   By: Tish Frederickson M.D.   On: 08/10/2023 00:26   CT Cervical Spine Wo Contrast Result Date: 08/10/2023 CLINICAL DATA:  Neck trauma (Age >= 65y); Back trauma, no prior imaging (Age >= 16y) Fall onto back EXAM: CT CERVICAL, THORACIC, AND LUMBAR SPINE WITHOUT CONTRAST TECHNIQUE: Multidetector CT imaging of the cervical, thoracic and lumbar spine was performed without intravenous contrast. Multiplanar CT image reconstructions were also generated. RADIATION DOSE REDUCTION: This exam was performed according to the departmental dose-optimization program which includes automated exposure control, adjustment of the mA and/or kV according to patient size and/or use of iterative reconstruction technique. COMPARISON:  Chest x-ray 08/09/2023 FINDINGS: CT CERVICAL SPINE FINDINGS Alignment: Normal. Skull base and vertebrae: Multilevel severe degenerative changes of the spine. No associated severe osseous foraminal or central canal stenosis. No acute fracture. No aggressive appearing focal osseous lesion or focal pathologic process. Soft tissues and spinal canal: No prevertebral fluid or swelling. No visible canal hematoma. Other: None. CT THORACIC SPINE FINDINGS Alignment: Normal. Vertebrae: Multilevel moderate degenerative changes of the spine. No associated severe osseous neural foraminal or central canal stenosis. No acute fracture or focal pathologic process. Paraspinal and other soft tissues: Negative. Disc levels: Maintained. CT LUMBAR SPINE FINDINGS Segmentation: 5 lumbar type vertebrae. Alignment: Normal. Vertebrae: No acute fracture or focal pathologic process. L2-L3 left and interbody surgical hardware fusion. L4-L5 multilevel moderate degenerative changes of the spine. No CT evidence of surgical hardware complication. No associated severe osseous neural foraminal or central canal stenosis. Posterior element surgical hardware. Paraspinal and other soft tissues: Negative. Disc levels: Multilevel  intervertebral disc space vacuum phenomenon. Other: Trace to small right pleural effusion. Limited evaluation of the lungs due to motion artifact. Diffuse bronchial wall thickening. Question developing peribronchovascular ground-glass airspace opacities. Cardiomegaly. 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 Severe atherosclerotic plaque. Right chest wall dialysis catheter with tip within the right atrium in the region of the inferior cavoatrial junction. Calcified uterine fibroids. IMPRESSION: 1. No acute displaced fracture or traumatic listhesis of the cervical, thoracic, lumbar spine. 2. Trace to small right pleural effusion. Not well visualized on chest x-ray 08/09/2023. 3. Diffuse bronchial wall thickening. Question developing peribronchovascular ground-glass airspace opacities. Limited evaluation due to motion artifact. Developing infection/inflammation not excluded. 4.  Aortic Atherosclerosis (ICD10-I70.0). 5. 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 Recommend annual imaging followup by CTA or MRA. This recommendation follows 2010 ACCF/AHA/AATS/ACR/ASA/SCA/SCAI/SIR/STS/SVM Guidelines for the Diagnosis and Management of Patients with Thoracic Aortic Disease. Circulation. 2010; 121: Z610-R604. Aortic aneurysm NOS (ICD10-I71.9). Electronically Signed   By: Tish Frederickson M.D.   On: 08/10/2023 00:25   CT Thoracic Spine Wo Contrast Result Date: 08/10/2023 CLINICAL DATA:  Neck trauma (Age >= 65y); Back trauma, no prior imaging (Age >= 16y) Fall onto back EXAM: CT CERVICAL, THORACIC, AND LUMBAR SPINE WITHOUT CONTRAST TECHNIQUE: Multidetector CT imaging of the cervical, thoracic and lumbar spine was performed without intravenous contrast. Multiplanar CT image reconstructions were also generated. RADIATION DOSE REDUCTION: This exam was  performed according to the departmental dose-optimization program which includes automated exposure control, adjustment of the mA and/or kV according to  patient size and/or use of iterative reconstruction technique. COMPARISON:  Chest x-ray 08/09/2023 FINDINGS: CT CERVICAL SPINE FINDINGS Alignment: Normal. Skull base and vertebrae: Multilevel severe degenerative changes of the spine. No associated severe osseous foraminal or central canal stenosis. No acute fracture. No aggressive appearing focal osseous lesion or focal pathologic process. Soft tissues and spinal canal: No prevertebral fluid or swelling. No visible canal hematoma. Other: None. CT THORACIC SPINE FINDINGS Alignment: Normal. Vertebrae: Multilevel moderate degenerative changes of the spine. No associated severe osseous neural foraminal or central canal stenosis. No acute fracture or focal pathologic process. Paraspinal and other soft tissues: Negative. Disc levels: Maintained. CT LUMBAR SPINE FINDINGS Segmentation: 5 lumbar type vertebrae. Alignment: Normal. Vertebrae: No acute fracture or focal pathologic process. L2-L3 left and interbody surgical hardware fusion. L4-L5 multilevel moderate degenerative changes of the spine. No CT evidence of surgical hardware complication. No associated severe osseous neural foraminal or central canal stenosis. Posterior element surgical hardware. Paraspinal and other soft tissues: Negative. Disc levels: Multilevel intervertebral disc space vacuum phenomenon. Other: Trace to small right pleural effusion. Limited evaluation of the lungs due to motion artifact. Diffuse bronchial wall thickening. Question developing peribronchovascular ground-glass airspace opacities. Cardiomegaly. 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 Severe atherosclerotic plaque. Right chest wall dialysis catheter with tip within the right atrium in the region of the inferior cavoatrial junction. Calcified uterine fibroids. IMPRESSION: 1. No acute displaced fracture or traumatic listhesis of the cervical, thoracic, lumbar spine. 2. Trace to small right pleural effusion. Not well  visualized on chest x-ray 08/09/2023. 3. Diffuse bronchial wall thickening. Question developing peribronchovascular ground-glass airspace opacities. Limited evaluation due to motion artifact. Developing infection/inflammation not excluded. 4.  Aortic Atherosclerosis (ICD10-I70.0). 5. 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 Recommend annual imaging followup by CTA or MRA. This recommendation follows 2010 ACCF/AHA/AATS/ACR/ASA/SCA/SCAI/SIR/STS/SVM Guidelines for the Diagnosis and Management of Patients with Thoracic Aortic Disease. Circulation. 2010; 121: N829-F621. Aortic aneurysm NOS (ICD10-I71.9). Electronically Signed   By: Tish Frederickson M.D.   On: 08/10/2023 00:25   CT Lumbar Spine Wo Contrast Result Date: 08/10/2023 CLINICAL DATA:  Neck trauma (Age >= 65y); Back trauma, no prior imaging (Age >= 16y) Fall onto back EXAM: CT CERVICAL, THORACIC, AND LUMBAR SPINE WITHOUT CONTRAST TECHNIQUE: Multidetector CT imaging of the cervical, thoracic and lumbar spine was performed without intravenous contrast. Multiplanar CT image reconstructions were also generated. RADIATION DOSE REDUCTION: This exam was performed according to the departmental dose-optimization program which includes automated exposure control, adjustment of the mA and/or kV according to patient size and/or use of iterative reconstruction technique. COMPARISON:  Chest x-ray 08/09/2023 FINDINGS: CT CERVICAL SPINE FINDINGS Alignment: Normal. Skull base and vertebrae: Multilevel severe degenerative changes of the spine. No associated severe osseous foraminal or central canal stenosis. No acute fracture. No aggressive appearing focal osseous lesion or focal pathologic process. Soft tissues and spinal canal: No prevertebral fluid or swelling. No visible canal hematoma. Other: None. CT THORACIC SPINE FINDINGS Alignment: Normal. Vertebrae: Multilevel moderate degenerative changes of the spine. No associated severe osseous neural foraminal or central  canal stenosis. No acute fracture or focal pathologic process. Paraspinal and other soft tissues: Negative. Disc levels: Maintained. CT LUMBAR SPINE FINDINGS Segmentation: 5 lumbar type vertebrae. Alignment: Normal. Vertebrae: No acute fracture or focal pathologic process. L2-L3 left and interbody surgical hardware fusion. L4-L5 multilevel moderate degenerative changes of the  spine. No CT evidence of surgical hardware complication. No associated severe osseous neural foraminal or central canal stenosis. Posterior element surgical hardware. Paraspinal and other soft tissues: Negative. Disc levels: Multilevel intervertebral disc space vacuum phenomenon. Other: Trace to small right pleural effusion. Limited evaluation of the lungs due to motion artifact. Diffuse bronchial wall thickening. Question developing peribronchovascular ground-glass airspace opacities. Cardiomegaly. 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 Severe atherosclerotic plaque. Right chest wall dialysis catheter with tip within the right atrium in the region of the inferior cavoatrial junction. Calcified uterine fibroids. IMPRESSION: 1. No acute displaced fracture or traumatic listhesis of the cervical, thoracic, lumbar spine. 2. Trace to small right pleural effusion. Not well visualized on chest x-ray 08/09/2023. 3. Diffuse bronchial wall thickening. Question developing peribronchovascular ground-glass airspace opacities. Limited evaluation due to motion artifact. Developing infection/inflammation not excluded. 4.  Aortic Atherosclerosis (ICD10-I70.0). 5. 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 Recommend annual imaging followup by CTA or MRA. This recommendation follows 2010 ACCF/AHA/AATS/ACR/ASA/SCA/SCAI/SIR/STS/SVM Guidelines for the Diagnosis and Management of Patients with Thoracic Aortic Disease. Circulation. 2010; 121: [id number]. Aortic aneurysm NOS (ICD10-I71.9). Electronically Signed   By: Tish Frederickson M.D.   On:  08/10/2023 00:25   CT Head Wo Contrast Result Date: 08/10/2023 CLINICAL DATA:  Head trauma, minor (Age >= 65y).  Fall onto back. EXAM: CT HEAD WITHOUT CONTRAST TECHNIQUE: Contiguous axial images were obtained from the base of the skull through the vertex without intravenous contrast. RADIATION DOSE REDUCTION: This exam was performed according to the departmental dose-optimization program which includes automated exposure control, adjustment of the mA and/or kV according to patient size and/or use of iterative reconstruction technique. COMPARISON:  CT head 06/25/2023 FINDINGS: Brain: Cerebral ventricle sizes are concordant with the degree of cerebral volume loss. Chronic extensive patchy and confluent areas of decreased attenuation are noted throughout the deep and periventricular white matter of the cerebral hemispheres bilaterally, compatible with chronic microvascular ischemic disease. Left frontoparietal encephalomalacia. No evidence of large-territorial acute infarction. No parenchymal hemorrhage. No mass lesion. No extra-axial collection. No mass effect or midline shift. No hydrocephalus. Basilar cisterns are patent. Vascular: No hyperdense vessel. Atherosclerotic calcifications are present within the cavernous internal carotid arteries. Skull: No acute fracture or focal lesion. Sinuses/Orbits: Paranasal sinuses and mastoid air cells are clear. The orbits are unremarkable. Other: None. IMPRESSION: No acute intracranial abnormality. Electronically Signed   By: Tish Frederickson M.D.   On: 08/10/2023 00:10   DG Humerus Right Result Date: 08/09/2023 CLINICAL DATA:  90955 Arm pain 47829 ShOB x 1 hour and hypertension. Pt is a dialysis pt with last full treatment on Saturday. Hx of stroke with R sided deficits." Best images obtainable d/t pt condition. EXAM: RIGHT HUMERUS - 2+ VIEW COMPARISON:  None Available. FINDINGS: Likely elbow joint effusion. There is no evidence of fracture or other focal bone lesions.  Soft tissues are unremarkable. IMPRESSION: 1. Suggestion of an elbow joint effusion. Recommend dedicated 3 view radiograph of the elbow. 2.  No acute displaced fracture of the humerus. Electronically Signed   By: Tish Frederickson M.D.   On: 08/09/2023 23:24   DG Chest 2 View Result Date: 08/09/2023 CLINICAL DATA:  shob EXAM: CHEST - 2 VIEW COMPARISON:  CT chest 01/14/2023, chest x-ray 07/22/2023 FINDINGS: Right chest wall dialysis catheter with tip overlying the right atrium in the region of the inferior caval junction. Cardiomegaly. The heart and mediastinal contours are unchanged. Atherosclerotic plaque. Lingular atelectasis. No focal consolidation. Chronic coarse markings with no  overt pulmonary edema. No pleural effusion. No pneumothorax. No acute osseous abnormality.  Sternotomy wires are intact. IMPRESSION: 1. No active cardiopulmonary disease. 2. Aortic Atherosclerosis (ICD10-I70.0) and Emphysema (ICD10-J43.9). Electronically Signed   By: Tish Frederickson M.D.   On: 08/09/2023 23:21    Dialysis Orders:  AF TTS 3.5H 400/A1.5x EDW 46.1kg  2K/2Ca TDC  -Mircera 150 q 2 weeks - last 2/25  -Hectorol 9 TIW, Sensipar 60 TIW  Assessment/Plan: Hypertensive urgency - Home meds resumed. Receiving dialysis now. Follow trends.  ESRD -  HD TTS. HD today.  Volume  - Usually leaving near EDW. UFG 2.5L. Consider lower EDW.  Anemia  - Hgb > 11. No ESA needs. Follow trends  Metabolic bone disease -  Calcium acceptable. Continue home meds.  PAfib - Eliquis, Coreg  Frequent falls    Tomasa Blase PA-C [facility] 08/10/2023, 11:45 AM

## 2023-08-10 NOTE — Progress Notes (Signed)
 PROGRESS NOTE    Monique Henderson  JYN:[id number] DOB: 17-Jun-1950 DOA: 08/09/2023 PCP: Fleet Contras, MD  Chief Complaint  Patient presents with   Shortness of Breath    Hospital Course:  Monique Henderson is 73 y.o. female with hypertension, history of prior CVA, atrial fibrillation on Eliquis, chronic heart failure with reduced EF, aortic dissection s/p repair, ESRD on HD, who presents with dyspnea and pain in back, right arm after trip and fall at home.  Patient reports that she had a ground-level mechanical fall at home.  Denies any loss of consciousness. In the ED patient was found to be afebrile, saturating 100% on 2 L supplemental O2 with tachypnea, tachycardia, blood pressure 240/140.  EKG revealed sinus rhythm with PACs, RBBB, LVH, prolonged QT interval.  Chest x-ray was negative for acute findings.  Potassium 5.5, troponin 67. Nephrology was consulted by ED PA and patient was treated with morphine, Norvasc, clonidine, hydralazine, nitroglycerin ointment, and then nitroglycerin infusion in the ED.  Subjective: Evaluated patient while she was receiving dialysis today.  She denies any chest pain.  She reports she had some back pain earlier but this is since resolved.   Objective: Vitals:   08/10/23 0515 08/10/23 0548 08/10/23 0615 08/10/23 0630  BP: (!) 196/116 (!) 177/111 (!) 184/117 (!) 191/114  Pulse: 87 87 89 90  Resp: (!) 28 (!) 28 18 18   Temp:   (!) 97 F (36.1 C)   TempSrc:   Axillary   SpO2: 99% 99% 99% 99%   No intake or output data in the 24 hours ending 08/10/23 0749 There were no vitals filed for this visit.  Examination: General exam: Appears calm and comfortable, NAD, weak and frail appearing Respiratory system: No work of breathing, symmetric chest wall expansion Cardiovascular system: S1 & S2 heard, RRR.  Gastrointestinal system: Abdomen is nondistended, soft and nontender.  Neuro: Alert and oriented.  Speaks weakly. Extremities: no edema Skin: No  rashes, lesions Psychiatry: Mood & affect appropriate for situation.   Assessment & Plan:  Principal Problem:   Hypertensive crisis Active Problems:   Hyperkalemia   History of CVA (cerebrovascular accident)   Paroxysmal atrial fibrillation (HCC)   ESRD (end stage renal disease) (HCC)   Elevated troponin level not due to acute coronary syndrome   Chronic heart failure with mildly reduced ejection fraction (HFmrEF, 41-49%) (HCC)   History of COPD    Hypertensive crisis - Blood pressure on arrival 240/140 - Renal consulted for urgent dialysis - Continue Norvasc, clonidine, hydralazine, Coreg.  Will have to stop nitroglycerin drip for HD -- Expect improvement in blood pressure after HD -- cont tele  History of refractory HTN -- cont all home meds. Titrate as needed -- Prior admissions for hypertensive urgency   ESRD on HD TTS Hyperkalemia Hypervolemia - Reportedly no missed HD - Nephrology has been consulted, hemodialysis per renal -- Lokelma ordered - Renally dose all medications - Trend CMP  Mechanical fall Back pain Right upper extremity pain - No acute findings on imaging - Fall precautions --PT/OT when BP resolves - As needed pain control  Acute on chronic heart failure with reduced EF Severe LVH - EF 45% on TTE from January 2025 - Volume management with dialysis - Strict I's and O's - Monitor weights - DMT as BP tolerates -- Close outpt follow up with cardiology  Paroxysmal A-fib - Continue Eliquis and Coreg  Elevated troponin - Mild elevation with downtrend on repeat, 67 -> 57.  No  chest pain.  Doubt ACS. - Elevation likely reflects demand ischemia in setting of severe hypertension and hypervolemia - Continue manage blood pressure and volume status as above  History of CVA - Resume home meds  COPD - No wheezing, not currently in exacerbation - As needed bronchodilators  History of cocaine abuse - Patient denies recent use - UDS  pending  Degenerative changes of spine - Prior spinal surgeries - Multilevel severe degenerative changes of the spine seen on CT - No acute fractures, no evidence of hardware complications  Aneurysmal ascending thoracic aorta -- History of  aortic dissection s/p endovascular repair - Measures to 4.2 cm on CT -- Cont to follow with cardiovascular outpt - Recommended annual follow-up by CTA or MRA  Severe atherosclerotic plaque - Follow-up with cardiology - Continue coreg, eliquis, statin  QTc prolongation - 505 on arrival, repeat EKG after dialysis to monitor for resolution - Avoid QT prolonging medications for now   DVT prophylaxis: Eliquis   Code Status: Limited: Do not attempt resuscitation (DNR) -DNR-LIMITED -Do Not Intubate/DNI  Family Communication:  Discussed directly with patient Disposition:  Inpatient, on tele, still hospitalized for BP management and hemodialysis, will discharge to home with home health when stabilized.   Consultants:  Treatment Team:  Consulting Physician: Estanislado Emms, MD  Procedures:    Antimicrobials:  Anti-infectives (From admission, onward)    None       Data Reviewed: I have personally reviewed following labs and imaging studies CBC: Recent Labs  Lab 08/09/23 2101 08/10/23 0506  WBC 6.8 7.4  HGB 12.4 11.4*  HCT 40.2 36.0  MCV 97.6 94.5  PLT 215 203   Basic Metabolic Panel: Recent Labs  Lab 08/09/23 2101 08/10/23 0506  NA 139 135  K 5.5* 5.6*  CL 95* 98  CO2 25 20*  GLUCOSE 104* 108*  BUN 48* 56*  CREATININE 9.46* 10.11*  CALCIUM 9.8 8.9   GFR: CrCl cannot be calculated (Unknown ideal weight.). Liver Function Tests: No results for input(s): "AST", "ALT", "ALKPHOS", "BILITOT", "PROT", "ALBUMIN" in the last 168 hours. CBG: No results for input(s): "GLUCAP" in the last 168 hours.  No results found for this or any previous visit (from the past 240 hours).   Radiology Studies: DG Elbow Complete Right Result  Date: 08/10/2023 CLINICAL DATA:  Elbow pain post fall rt humerus performed earlier Pain post fall EXAM: RIGHT ELBOW - COMPLETE 3+ VIEW COMPARISON:  None Available. FINDINGS: There is no evidence of fracture, dislocation, or joint effusion. There is no evidence of severe arthropathy or other focal bone abnormality. Soft tissues are unremarkable. IMPRESSION: No acute displaced fracture or dislocation. Electronically Signed   By: Tish Frederickson M.D.   On: 08/10/2023 00:26   CT Cervical Spine Wo Contrast Result Date: 08/10/2023 CLINICAL DATA:  Neck trauma (Age >= 65y); Back trauma, no prior imaging (Age >= 16y) Fall onto back EXAM: CT CERVICAL, THORACIC, AND LUMBAR SPINE WITHOUT CONTRAST TECHNIQUE: Multidetector CT imaging of the cervical, thoracic and lumbar spine was performed without intravenous contrast. Multiplanar CT image reconstructions were also generated. RADIATION DOSE REDUCTION: This exam was performed according to the departmental dose-optimization program which includes automated exposure control, adjustment of the mA and/or kV according to patient size and/or use of iterative reconstruction technique. COMPARISON:  Chest x-ray 08/09/2023 FINDINGS: CT CERVICAL SPINE FINDINGS Alignment: Normal. Skull base and vertebrae: Multilevel severe degenerative changes of the spine. No associated severe osseous foraminal or central canal stenosis. No acute fracture. No  aggressive appearing focal osseous lesion or focal pathologic process. Soft tissues and spinal canal: No prevertebral fluid or swelling. No visible canal hematoma. Other: None. CT THORACIC SPINE FINDINGS Alignment: Normal. Vertebrae: Multilevel moderate degenerative changes of the spine. No associated severe osseous neural foraminal or central canal stenosis. No acute fracture or focal pathologic process. Paraspinal and other soft tissues: Negative. Disc levels: Maintained. CT LUMBAR SPINE FINDINGS Segmentation: 5 lumbar type vertebrae. Alignment:  Normal. Vertebrae: No acute fracture or focal pathologic process. L2-L3 left and interbody surgical hardware fusion. L4-L5 multilevel moderate degenerative changes of the spine. No CT evidence of surgical hardware complication. No associated severe osseous neural foraminal or central canal stenosis. Posterior element surgical hardware. Paraspinal and other soft tissues: Negative. Disc levels: Multilevel intervertebral disc space vacuum phenomenon. Other: Trace to small right pleural effusion. Limited evaluation of the lungs due to motion artifact. Diffuse bronchial wall thickening. Question developing peribronchovascular ground-glass airspace opacities. Cardiomegaly. 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 Severe atherosclerotic plaque. Right chest wall dialysis catheter with tip within the right atrium in the region of the inferior cavoatrial junction. Calcified uterine fibroids. IMPRESSION: 1. No acute displaced fracture or traumatic listhesis of the cervical, thoracic, lumbar spine. 2. Trace to small right pleural effusion. Not well visualized on chest x-ray 08/09/2023. 3. Diffuse bronchial wall thickening. Question developing peribronchovascular ground-glass airspace opacities. Limited evaluation due to motion artifact. Developing infection/inflammation not excluded. 4.  Aortic Atherosclerosis (ICD10-I70.0). 5. 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 Recommend annual imaging followup by CTA or MRA. This recommendation follows 2010 ACCF/AHA/AATS/ACR/ASA/SCA/SCAI/SIR/STS/SVM Guidelines for the Diagnosis and Management of Patients with Thoracic Aortic Disease. Circulation. 2010; 121: G644-I347. Aortic aneurysm NOS (ICD10-I71.9). Electronically Signed   By: Tish Frederickson M.D.   On: 08/10/2023 00:25   CT Thoracic Spine Wo Contrast Result Date: 08/10/2023 CLINICAL DATA:  Neck trauma (Age >= 65y); Back trauma, no prior imaging (Age >= 16y) Fall onto back EXAM: CT CERVICAL, THORACIC, AND  LUMBAR SPINE WITHOUT CONTRAST TECHNIQUE: Multidetector CT imaging of the cervical, thoracic and lumbar spine was performed without intravenous contrast. Multiplanar CT image reconstructions were also generated. RADIATION DOSE REDUCTION: This exam was performed according to the departmental dose-optimization program which includes automated exposure control, adjustment of the mA and/or kV according to patient size and/or use of iterative reconstruction technique. COMPARISON:  Chest x-ray 08/09/2023 FINDINGS: CT CERVICAL SPINE FINDINGS Alignment: Normal. Skull base and vertebrae: Multilevel severe degenerative changes of the spine. No associated severe osseous foraminal or central canal stenosis. No acute fracture. No aggressive appearing focal osseous lesion or focal pathologic process. Soft tissues and spinal canal: No prevertebral fluid or swelling. No visible canal hematoma. Other: None. CT THORACIC SPINE FINDINGS Alignment: Normal. Vertebrae: Multilevel moderate degenerative changes of the spine. No associated severe osseous neural foraminal or central canal stenosis. No acute fracture or focal pathologic process. Paraspinal and other soft tissues: Negative. Disc levels: Maintained. CT LUMBAR SPINE FINDINGS Segmentation: 5 lumbar type vertebrae. Alignment: Normal. Vertebrae: No acute fracture or focal pathologic process. L2-L3 left and interbody surgical hardware fusion. L4-L5 multilevel moderate degenerative changes of the spine. No CT evidence of surgical hardware complication. No associated severe osseous neural foraminal or central canal stenosis. Posterior element surgical hardware. Paraspinal and other soft tissues: Negative. Disc levels: Multilevel intervertebral disc space vacuum phenomenon. Other: Trace to small right pleural effusion. Limited evaluation of the lungs due to motion artifact. Diffuse bronchial wall thickening. Question developing peribronchovascular ground-glass airspace opacities.  Cardiomegaly. Aneurysmal  ascending thoracic aorta measuring up to 4.2 cm. Severe atherosclerotic plaque. Right chest wall dialysis catheter with tip within the right atrium in the region of the inferior cavoatrial junction. Calcified uterine fibroids. IMPRESSION: 1. No acute displaced fracture or traumatic listhesis of the cervical, thoracic, lumbar spine. 2. Trace to small right pleural effusion. Not well visualized on chest x-ray 08/09/2023. 3. Diffuse bronchial wall thickening. Question developing peribronchovascular ground-glass airspace opacities. Limited evaluation due to motion artifact. Developing infection/inflammation not excluded. 4.  Aortic Atherosclerosis (ICD10-I70.0). 5. 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 Recommend annual imaging followup by CTA or MRA. This recommendation follows 2010 ACCF/AHA/AATS/ACR/ASA/SCA/SCAI/SIR/STS/SVM Guidelines for the Diagnosis and Management of Patients with Thoracic Aortic Disease. Circulation. 2010; 121: W098-J191. Aortic aneurysm NOS (ICD10-I71.9). Electronically Signed   By: Tish Frederickson M.D.   On: 08/10/2023 00:25   CT Lumbar Spine Wo Contrast Result Date: 08/10/2023 CLINICAL DATA:  Neck trauma (Age >= 65y); Back trauma, no prior imaging (Age >= 16y) Fall onto back EXAM: CT CERVICAL, THORACIC, AND LUMBAR SPINE WITHOUT CONTRAST TECHNIQUE: Multidetector CT imaging of the cervical, thoracic and lumbar spine was performed without intravenous contrast. Multiplanar CT image reconstructions were also generated. RADIATION DOSE REDUCTION: This exam was performed according to the departmental dose-optimization program which includes automated exposure control, adjustment of the mA and/or kV according to patient size and/or use of iterative reconstruction technique. COMPARISON:  Chest x-ray 08/09/2023 FINDINGS: CT CERVICAL SPINE FINDINGS Alignment: Normal. Skull base and vertebrae: Multilevel severe degenerative changes of the spine. No associated severe  osseous foraminal or central canal stenosis. No acute fracture. No aggressive appearing focal osseous lesion or focal pathologic process. Soft tissues and spinal canal: No prevertebral fluid or swelling. No visible canal hematoma. Other: None. CT THORACIC SPINE FINDINGS Alignment: Normal. Vertebrae: Multilevel moderate degenerative changes of the spine. No associated severe osseous neural foraminal or central canal stenosis. No acute fracture or focal pathologic process. Paraspinal and other soft tissues: Negative. Disc levels: Maintained. CT LUMBAR SPINE FINDINGS Segmentation: 5 lumbar type vertebrae. Alignment: Normal. Vertebrae: No acute fracture or focal pathologic process. L2-L3 left and interbody surgical hardware fusion. L4-L5 multilevel moderate degenerative changes of the spine. No CT evidence of surgical hardware complication. No associated severe osseous neural foraminal or central canal stenosis. Posterior element surgical hardware. Paraspinal and other soft tissues: Negative. Disc levels: Multilevel intervertebral disc space vacuum phenomenon. Other: Trace to small right pleural effusion. Limited evaluation of the lungs due to motion artifact. Diffuse bronchial wall thickening. Question developing peribronchovascular ground-glass airspace opacities. Cardiomegaly. 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 Severe atherosclerotic plaque. Right chest wall dialysis catheter with tip within the right atrium in the region of the inferior cavoatrial junction. Calcified uterine fibroids. IMPRESSION: 1. No acute displaced fracture or traumatic listhesis of the cervical, thoracic, lumbar spine. 2. Trace to small right pleural effusion. Not well visualized on chest x-ray 08/09/2023. 3. Diffuse bronchial wall thickening. Question developing peribronchovascular ground-glass airspace opacities. Limited evaluation due to motion artifact. Developing infection/inflammation not excluded. 4.  Aortic  Atherosclerosis (ICD10-I70.0). 5. 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 Recommend annual imaging followup by CTA or MRA. This recommendation follows 2010 ACCF/AHA/AATS/ACR/ASA/SCA/SCAI/SIR/STS/SVM Guidelines for the Diagnosis and Management of Patients with Thoracic Aortic Disease. Circulation. 2010; 121: Y782-N562. Aortic aneurysm NOS (ICD10-I71.9). Electronically Signed   By: Tish Frederickson M.D.   On: 08/10/2023 00:25   CT Head Wo Contrast Result Date: 08/10/2023 CLINICAL DATA:  Head trauma, minor (Age >= 65y).  Fall onto  back. EXAM: CT HEAD WITHOUT CONTRAST TECHNIQUE: Contiguous axial images were obtained from the base of the skull through the vertex without intravenous contrast. RADIATION DOSE REDUCTION: This exam was performed according to the departmental dose-optimization program which includes automated exposure control, adjustment of the mA and/or kV according to patient size and/or use of iterative reconstruction technique. COMPARISON:  CT head 06/25/2023 FINDINGS: Brain: Cerebral ventricle sizes are concordant with the degree of cerebral volume loss. Chronic extensive patchy and confluent areas of decreased attenuation are noted throughout the deep and periventricular white matter of the cerebral hemispheres bilaterally, compatible with chronic microvascular ischemic disease. Left frontoparietal encephalomalacia. No evidence of large-territorial acute infarction. No parenchymal hemorrhage. No mass lesion. No extra-axial collection. No mass effect or midline shift. No hydrocephalus. Basilar cisterns are patent. Vascular: No hyperdense vessel. Atherosclerotic calcifications are present within the cavernous internal carotid arteries. Skull: No acute fracture or focal lesion. Sinuses/Orbits: Paranasal sinuses and mastoid air cells are clear. The orbits are unremarkable. Other: None. IMPRESSION: No acute intracranial abnormality. Electronically Signed   By: Tish Frederickson M.D.   On: 08/10/2023  00:10   DG Humerus Right Result Date: 08/09/2023 CLINICAL DATA:  90955 Arm pain 40981 ShOB x 1 hour and hypertension. Pt is a dialysis pt with last full treatment on Saturday. Hx of stroke with R sided deficits." Best images obtainable d/t pt condition. EXAM: RIGHT HUMERUS - 2+ VIEW COMPARISON:  None Available. FINDINGS: Likely elbow joint effusion. There is no evidence of fracture or other focal bone lesions. Soft tissues are unremarkable. IMPRESSION: 1. Suggestion of an elbow joint effusion. Recommend dedicated 3 view radiograph of the elbow. 2.  No acute displaced fracture of the humerus. Electronically Signed   By: Tish Frederickson M.D.   On: 08/09/2023 23:24   DG Chest 2 View Result Date: 08/09/2023 CLINICAL DATA:  shob EXAM: CHEST - 2 VIEW COMPARISON:  CT chest 01/14/2023, chest x-ray 07/22/2023 FINDINGS: Right chest wall dialysis catheter with tip overlying the right atrium in the region of the inferior caval junction. Cardiomegaly. The heart and mediastinal contours are unchanged. Atherosclerotic plaque. Lingular atelectasis. No focal consolidation. Chronic coarse markings with no overt pulmonary edema. No pleural effusion. No pneumothorax. No acute osseous abnormality.  Sternotomy wires are intact. IMPRESSION: 1. No active cardiopulmonary disease. 2. Aortic Atherosclerosis (ICD10-I70.0) and Emphysema (ICD10-J43.9). Electronically Signed   By: Tish Frederickson M.D.   On: 08/09/2023 23:21    Scheduled Meds:  [START ON 08/11/2023] amLODipine  10 mg Oral Daily   apixaban  2.5 mg Oral BID   carvedilol  25 mg Oral BID WC   cloNIDine  0.1 mg Oral TID   hydrALAZINE  100 mg Oral Q8H   isosorbide mononitrate  30 mg Oral Once per day on Tuesday Thursday Saturday   And   [START ON 08/11/2023] isosorbide mononitrate  60 mg Oral Once per day on Sunday Monday Wednesday Friday   pantoprazole  40 mg Oral BID   rosuvastatin  10 mg Oral Daily   sodium chloride flush  3 mL Intravenous Q12H   Continuous  Infusions:  nitroGLYCERIN 45 mcg/min (08/10/23 0652)     LOS: 0 days   MDM: Patient is high risk for one or more organ failure.  They necessitate ongoing hospitalization for continued IV therapies and subsequent lab monitoring. Total time spent interpreting labs and vitals, coordinating care amongst consultants and care team members, directly assessing and discussing care with the patient and/or family: 55 min  Debarah Crape, DO [facility]  To contact the attending physician between 7A-7P please use Epic Chat. To contact the covering physician during after hours 7P-7A, please review Amion.   08/10/2023, 7:49 AM   *This document has been created with the assistance of dictation software. Please excuse typographical errors. *

## 2023-08-10 NOTE — ED Notes (Signed)
 MD notified that pt will not receive dialysis today d/t pt being on a nitroglycerin drip. Dialysis said that they would be able to dialisize pt if she was admited to inpatient or in room 47. Room 47 is currently occupied by a pt

## 2023-08-10 NOTE — Progress Notes (Signed)
Pt receives out-pt HD at [facility] [place] on [facility]TS. Will assist as needed.   Olivia Canter Renal Navigator [phone]

## 2023-08-11 DIAGNOSIS — I169 Hypertensive crisis, unspecified: Secondary | ICD-10-CM | POA: Diagnosis not present

## 2023-08-11 LAB — CBC WITH DIFFERENTIAL/PLATELET
Abs Immature Granulocytes: 0.01 10*3/uL (ref 0.00–0.07)
Basophils Absolute: 0.1 10*3/uL (ref 0.0–0.1)
Basophils Relative: 1 %
Eosinophils Absolute: 0.2 10*3/uL (ref 0.0–0.5)
Eosinophils Relative: 5 %
HCT: 32.3 % — ABNORMAL LOW (ref 36.0–46.0)
Hemoglobin: 10.2 g/dL — ABNORMAL LOW (ref 12.0–15.0)
Immature Granulocytes: 0 %
Lymphocytes Relative: 18 %
Lymphs Abs: 0.7 10*3/uL (ref 0.7–4.0)
MCH: 30.1 pg (ref 26.0–34.0)
MCHC: 31.6 g/dL (ref 30.0–36.0)
MCV: 95.3 fL (ref 80.0–100.0)
Monocytes Absolute: 0.6 10*3/uL (ref 0.1–1.0)
Monocytes Relative: 16 %
Neutro Abs: 2.3 10*3/uL (ref 1.7–7.7)
Neutrophils Relative %: 60 %
Platelets: 165 10*3/uL (ref 150–400)
RBC: 3.39 MIL/uL — ABNORMAL LOW (ref 3.87–5.11)
RDW: 20.4 % — ABNORMAL HIGH (ref 11.5–15.5)
WBC: 3.9 10*3/uL — ABNORMAL LOW (ref 4.0–10.5)
nRBC: 0 % (ref 0.0–0.2)

## 2023-08-11 LAB — COMPREHENSIVE METABOLIC PANEL
ALT: 9 U/L (ref 0–44)
AST: 16 U/L (ref 15–41)
Albumin: 3.2 g/dL — ABNORMAL LOW (ref 3.5–5.0)
Alkaline Phosphatase: 56 U/L (ref 38–126)
Anion gap: 11 (ref 5–15)
BUN: 30 mg/dL — ABNORMAL HIGH (ref 8–23)
CO2: 28 mmol/L (ref 22–32)
Calcium: 9 mg/dL (ref 8.9–10.3)
Chloride: 97 mmol/L — ABNORMAL LOW (ref 98–111)
Creatinine, Ser: 6.17 mg/dL — ABNORMAL HIGH (ref 0.44–1.00)
GFR, Estimated: 7 mL/min — ABNORMAL LOW (ref >60)
Glucose, Bld: 98 mg/dL (ref 70–99)
Potassium: 4 mmol/L (ref 3.5–5.1)
Sodium: 136 mmol/L (ref 135–145)
Total Bilirubin: 0.7 mg/dL (ref 0.0–1.2)
Total Protein: 6.6 g/dL (ref 6.5–8.1)

## 2023-08-11 LAB — MAGNESIUM: Magnesium: 2.1 mg/dL (ref 1.7–2.4)

## 2023-08-11 LAB — PHOSPHORUS: Phosphorus: 5.9 mg/dL — ABNORMAL HIGH (ref 2.5–4.6)

## 2023-08-11 LAB — HEPATITIS B SURFACE ANTIBODY, QUANTITATIVE: Hep B S AB Quant (Post): 13.1 m[IU]/mL

## 2023-08-11 MED ORDER — IPRATROPIUM-ALBUTEROL 0.5-2.5 (3) MG/3ML IN SOLN: 3.0000 mL | RESPIRATORY_TRACT | Status: DC | PRN

## 2023-08-11 MED ORDER — METOPROLOL TARTRATE 5 MG/5ML IV SOLN: 5.0000 mg | INTRAVENOUS | Status: DC | PRN

## 2023-08-11 MED ORDER — HYDRALAZINE HCL 20 MG/ML IJ SOLN: 10.0000 mg | INTRAMUSCULAR | Status: DC | PRN

## 2023-08-11 NOTE — Progress Notes (Signed)
  [facility] Progress Note   Subjective:  Seen in room. BP improved post HD. Net UF 2L. Denies chest pain, sob. Stomach hurts but says she wants to go home.   Objective Vitals:   08/10/23 2300 08/11/23 0303 08/11/23 0600 08/11/23 0700  BP: (!) 151/89 (!) 164/96 (!) 173/101 (!) 154/83  Pulse: 84 76  75  Resp: (!) 24 20  20   Temp: 99.8 F (37.7 C) 99.5 F (37.5 C)  98.3 F (36.8 C)  TempSrc: Oral Oral  Oral  SpO2: 96% 96%  96%  Weight:      Height:         Additional Objective Labs: Basic Metabolic Panel: Recent Labs  Lab 08/09/23 2101 08/10/23 0506 08/11/23 0225  NA 139 135 136  K 5.5* 5.6* 4.0  CL 95* 98 97*  CO2 25 20* 28  GLUCOSE 104* 108* 98  BUN 48* 56* 30*  CREATININE 9.46* 10.11* 6.17*  CALCIUM 9.8 8.9 9.0  PHOS  --   --  5.9*   CBC: Recent Labs  Lab 08/09/23 2101 08/10/23 0506 08/11/23 0225  WBC 6.8 7.4 3.9*  NEUTROABS  --   --  2.3  HGB 12.4 11.4* 10.2*  HCT 40.2 36.0 32.3*  MCV 97.6 94.5 95.3  PLT 215 203 165   Blood Culture    Component Value Date/Time   SDES BLOOD SITE NOT SPECIFIED 07/22/2023 1558   SPECREQUEST  07/22/2023 1558    BOTTLES DRAWN AEROBIC AND ANAEROBIC Blood Culture results may not be optimal due to an inadequate volume of blood received in culture bottles   CULT  07/22/2023 1558    NO GROWTH 5 DAYS Performed at [facility], [address] [place], [place] [address]    REPTSTATUS 07/27/2023 FINAL 07/22/2023 1558     Physical Exam General: Frail woman, nad Heart: RRR Lungs: Clear, normal wob  Abdomen: soft non-tender Extremities: No LE edema  Dialysis Access: R chest TDC  Medications:   amLODipine  10 mg Oral Daily   apixaban  2.5 mg Oral BID   carvedilol  25 mg Oral BID WC   cloNIDine  0.3 mg Oral TID   hydrALAZINE  100 mg Oral Q8H   isosorbide mononitrate  30 mg Oral Once per day on Tuesday Thursday Saturday   And   isosorbide mononitrate  60 mg Oral Once per day on Sunday Monday  Wednesday Friday   pantoprazole  40 mg Oral BID   rosuvastatin  10 mg Oral Daily   sodium chloride flush  3 mL Intravenous Q12H    Dialysis Orders:  AF TTS 3.5H 400/A1.5x EDW 46.1kg  2K/2Ca TDC  -Mircera 150 q 2 weeks - last 2/25  -Hectorol 9 TIW, Sensipar 60 TIW   Assessment/Plan: Hypertensive urgency - Home meds resumed. Completed HD here 3/11. Improving.  ESRD -  HD TTS. Next HD Thurs.  Volume  - Usually leaving near EDW. UFG 2.5L. Now under EDW - will lower at discharge  Anemia  - Hgb 10-11. No ESA needs. Follow trends  Metabolic bone disease -  Calcium/Phos acceptable. Continue home meds.  PAfib - Eliquis, Coreg  Frequent falls    Tomasa Blase PA-C [facility] 08/11/2023,8:36 AM

## 2023-08-11 NOTE — Hospital Course (Addendum)
 Brief Narrative:   73 y.o. female with medical history significant for hypertension, history of CVA, atrial fibrillation on Eliquis, chronic HFmrEF, and ESRD on hemodialysis who presents with shortness of breath and pain in her back and right arm after a trip and fall at home. Patient complains of shortness of breath that developed during the day yesterday.  Admitted to the hospital with hypertensive crisis, volume overload.  Nephrology consulted for dialysis. After dialysis blood pressures improved, now on home p.o. medication feeling great.  PT/OT cleared her for discharge.  Medically stable for discharge as well.  Assessment & Plan:  Principal Problem:   Hypertensive crisis Active Problems:   Hyperkalemia   History of CVA (cerebrovascular accident)   Paroxysmal atrial fibrillation (HCC)   ESRD (end stage renal disease) (HCC)   Elevated troponin level not due to acute coronary syndrome   Chronic heart failure with mildly reduced ejection fraction (HFmrEF, 41-49%) (HCC)   History of COPD   Hypertensive crisis, resolved -Resumed home blood pressure medications including Norvasc, clonidine, hydralazine, Imdur. -Nitroglycerin drip turned off   ESRD; hyperkalemia; hypervolemia  -HD per nephrology team   Fall with back and RUE pain  Trauma workup including CT head, cervical, thoracic, lumbar spine are negative.  Right elbow, right humerus x-rays are negative.   Acute on chronic HFmrEF  - EF was 45% on TTE from January 2025  -Fluid status improved with dialysis.  Resume outpatient HD sessions   PAF  - Eliquis, Coreg  IV as needed   Elevated troponin  Likely demand ischemia   Hx of CVA  Eliquis and statin   COPD  As needed bronchodilators     DVT prophylaxis: Eliquis    Code Status: Limited: Do not attempt resuscitation (DNR) -DNR-LIMITED -Do Not Intubate/DNI  Family Communication:   Status is: Inpatient Remains inpatient appropriate because: Discharge  home  Subjective: Doing well no complaints, wishing to go home   Examination:  General exam: Appears calm and comfortable  Respiratory system: Clear to auscultation. Respiratory effort normal. Cardiovascular system: S1 & S2 heard, RRR. No JVD, murmurs, rubs, gallops or clicks. No pedal edema. Gastrointestinal system: Abdomen is nondistended, soft and nontender. No organomegaly or masses felt. Normal bowel sounds heard. Central nervous system: Alert and oriented. No focal neurological deficits. Extremities: Symmetric 5 x 5 power. Skin: No rashes, lesions or ulcers Psychiatry: Judgement and insight appear normal. Mood & affect appropriate.

## 2023-08-11 NOTE — Plan of Care (Signed)
  Problem: Education: Goal: Knowledge of General Education information will improve Description: Including pain rating scale, medication(s)/side effects and non-pharmacologic comfort measures Outcome: Progressing   Problem: Clinical Measurements: Goal: Will remain free from infection Outcome: Progressing   Problem: Safety: Goal: Ability to remain free from injury will improve Outcome: Progressing   Problem: Skin Integrity: Goal: Risk for impaired skin integrity will decrease Outcome: Progressing   Problem: Education: Goal: Knowledge of disease and its progression will improve Outcome: Progressing   Problem: Health Behavior/Discharge Planning: Goal: Ability to manage health-related needs will improve Outcome: Not Progressing   Problem: Activity: Goal: Risk for activity intolerance will decrease Outcome: Not Progressing   Problem: Coping: Goal: Level of anxiety will decrease Outcome: Not Progressing   Problem: Pain Managment: Goal: General experience of comfort will improve and/or be controlled Outcome: Not Progressing

## 2023-08-11 NOTE — Discharge Planning (Signed)
 [facility] Patient Discharge Orders- [facility] CLINIC: AF  Patient's name: Monique Henderson Admit/DC Dates: 08/09/2023 - 08/11/2023  Discharge Diagnoses: Hypertensive urgency - resolved  Fall - imaging negative  Recent Labs  Lab 08/11/23 0225  HGB 10.2*  K 4.0  CALCIUM 9.0  PHOS 5.9*  ALBUMIN 3.2*   Aranesp: Given: --   Date of last dose/amount: --   PRBC's Given: -- Date/# of units: -- ESA dose for discharge: No change    Outpatient Dialysis Orders:  -Heparin: No change   -Bath: No change   -EDW 45 kg    Access intervention/Change: ---  -IV Antibiotics: --  OTHER/APPTS  CODE STATUS: Limited DNR    Completed by: Tomasa Blase PA-C   D/C Meds to be reconciled by nurse after every discharge.    Reviewed by: MD:______ RN_______

## 2023-08-11 NOTE — Evaluation (Signed)
 Physical Therapy Brief Evaluation and Discharge Note Patient Details Name: Monique Henderson MRN: [id number] DOB: 04/15/1951 Today's Date: 08/11/2023   History of Present Illness  73 y.o. female admitted 3/12   who presented with shortness of breath and pain in her back and right arm after a trip and fall at home. Patient complained of shortness of breath.  Admitted to the hospital with hypertensive crisis, volume overload.  Nephrology consulted for dialysis. PMH: hypertension, history of CVA, atrial fibrillation on Eliquis, chronic HFmrEF, and ESRD on hemodialysis  Clinical Impression  Pt admitted with above diagnosis.  Pt currently without functional limitations. Scored 22/24 on DGI suggesting low risk of falls.  Feel that pt is at baseline. No further PT needed and has needed equipment.    PT Assessment Patient does not need any further PT services  Assistance Needed at Discharge  None    Equipment Recommendations None recommended by PT  Recommendations for Other Services       Precautions/Restrictions Precautions Precautions: None Restrictions Weight Bearing Restrictions Per Provider Order: No        Mobility  Bed Mobility Rolling: Independent        Transfers Overall transfer level: Independent                      Ambulation/Gait Ambulation/Gait assistance: Independent Gait Distance (Feet): 500 Feet Assistive device: None Gait Pattern/deviations: WFL(Within Functional Limits) Gait Speed: Pace WFL General Gait Details: No LOB with gait wtihout device.  Pt withstands challenges to balance.  Home Activity Instructions    Stairs Stairs: Yes Stairs assistance: Modified independent (Device/Increase time) Stair Management: One rail Right, Alternating pattern, Forwards Number of Stairs: 9 General stair comments: No problems with asscending and descending stairs  Modified Rankin (Stroke Patients Only)        Balance                  Standardized Balance Assessment Standardized Balance Assessment : Dynamic Gait Index Dynamic Gait Index Level Surface: Normal Change in Gait Speed: Normal Gait with Horizontal Head Turns: Normal Gait with Vertical Head Turns: Normal Gait and Pivot Turn: Normal Step Over Obstacle: Mild Impairment Step Around Obstacles: Normal Steps: Mild Impairment Total Score: 22      Pertinent Vitals/Pain PT - Brief Vital Signs All Vital Signs Stable: Yes Pain Assessment Pain Assessment: No/denies pain     Home Living Family/patient expects to be discharged to:: Private residence Living Arrangements: Spouse/significant other Available Help at Discharge: Family;Available 24 hours/day Home Environment: Stairs to enter  [employer] of Steps: flight Home Equipment: Agricultural consultant (2 wheels);Cane - single point;BSC/3in1        Prior Function Level of Independence: Independent      UE/LE Assessment   UE ROM/Strength/Tone/Coordination: WFL    LE ROM/Strength/Tone/Coordination: [facility]      Communication   Communication Communication: No apparent difficulties     Cognition         General Comments      Exercises     Assessment/Plan    PT Problem List         PT Visit Diagnosis Muscle weakness (generalized) (M62.81)    No Skilled PT Patient at baseline level of functioning;Patient is independent with all acitivity/mobility;All education completed   Co-evaluation                AMPAC 6 Clicks Help needed turning from your back to your side while in a flat bed without using  bedrails?: None Help needed moving from lying on your back to sitting on the side of a flat bed without using bedrails?: None Help needed moving to and from a bed to a chair (including a wheelchair)?: None Help needed standing up from a chair using your arms (e.g., wheelchair or bedside chair)?: None Help needed to walk in hospital room?: None Help needed climbing 3-5 steps with a railing? :  None 6 Click Score: 24      End of Session Equipment Utilized During Treatment: Gait belt Activity Tolerance: Patient tolerated treatment well Patient left: in bed;with call bell/phone within reach;with bed alarm set Nurse Communication: Mobility status PT Visit Diagnosis: Muscle weakness (generalized) (M62.81)     Time: 9629-5284 PT Time Calculation (min) (ACUTE ONLY): 27 min  Charges:   PT Evaluation $PT Eval Low Complexity: 1 Low PT Treatments $Gait Training: 8-22 mins    Monique Henderson,PT Acute Rehab Services [phone]   Bevelyn Buckles  08/11/2023, 2:40 PM

## 2023-08-11 NOTE — Progress Notes (Signed)
 Pt d/c today. Contacted [facility] to be advised of pt's d/c date and that pt should resume care tomorrow.   Olivia Canter Renal Navigator [phone]

## 2023-08-11 NOTE — Progress Notes (Signed)
 OT Cancellation Note  Patient Details Name: Monique Henderson MRN: [id number] DOB: 18-Aug-1950   Cancelled Treatment:    Reason Eval/Treat Not Completed: OT screened, no needs identified, will sign off (per PT, pt at baseline with no acute OT needs at this time. Will screen. Please reconsult if there is a change in pt status)  Carver Fila, OTD, OTR/L [employer] Preferred Acute Rehab [phone]   Dalphine Handing 08/11/2023, 12:50 PM

## 2023-08-11 NOTE — Discharge Summary (Signed)
 Physician Discharge Summary  DEMESHA BOORMAN NWG:[id number] DOB: 1950/10/24 DOA: 08/09/2023  PCP: Fleet Contras, MD  Admit date: 08/09/2023 Discharge date: 08/11/2023  Admitted From: Home Disposition: Home  Recommendations for Outpatient Follow-up:  Follow up with PCP in 1-2 weeks Please obtain BMP/CBC in one week your next doctors visit.  Resume outpatient hemodialysis sessions and home p.o. medications  Discharge Condition: Stable CODE STATUS: DNR Diet recommendation: Heart healthy/renal  Brief/Interim Summary: Brief Narrative:   73 y.o. female with medical history significant for hypertension, history of CVA, atrial fibrillation on Eliquis, chronic HFmrEF, and ESRD on hemodialysis who presents with shortness of breath and pain in her back and right arm after a trip and fall at home. Patient complains of shortness of breath that developed during the day yesterday.  Admitted to the hospital with hypertensive crisis, volume overload.  Nephrology consulted for dialysis. After dialysis blood pressures improved, now on home p.o. medication feeling great.  PT/OT cleared her for discharge.  Medically stable for discharge as well.  Assessment & Plan:  Principal Problem:   Hypertensive crisis Active Problems:   Hyperkalemia   History of CVA (cerebrovascular accident)   Paroxysmal atrial fibrillation (HCC)   ESRD (end stage renal disease) (HCC)   Elevated troponin level not due to acute coronary syndrome   Chronic heart failure with mildly reduced ejection fraction (HFmrEF, 41-49%) (HCC)   History of COPD   Hypertensive crisis, resolved -Resumed home blood pressure medications including Norvasc, clonidine, hydralazine, Imdur. -Nitroglycerin drip turned off   ESRD; hyperkalemia; hypervolemia  -HD per nephrology team   Fall with back and RUE pain  Trauma workup including CT head, cervical, thoracic, lumbar spine are negative.  Right elbow, right humerus x-rays are negative.    Acute on chronic HFmrEF  - EF was 45% on TTE from January 2025  -Fluid status improved with dialysis.  Resume outpatient HD sessions   PAF  - Eliquis, Coreg  IV as needed   Elevated troponin  Likely demand ischemia   Hx of CVA  Eliquis and statin   COPD  As needed bronchodilators     DVT prophylaxis: Eliquis    Code Status: Limited: Do not attempt resuscitation (DNR) -DNR-LIMITED -Do Not Intubate/DNI  Family Communication:   Status is: Inpatient Remains inpatient appropriate because: Discharge home  Subjective: Doing well no complaints, wishing to go home   Examination:  General exam: Appears calm and comfortable  Respiratory system: Clear to auscultation. Respiratory effort normal. Cardiovascular system: S1 & S2 heard, RRR. No JVD, murmurs, rubs, gallops or clicks. No pedal edema. Gastrointestinal system: Abdomen is nondistended, soft and nontender. No organomegaly or masses felt. Normal bowel sounds heard. Central nervous system: Alert and oriented. No focal neurological deficits. Extremities: Symmetric 5 x 5 power. Skin: No rashes, lesions or ulcers Psychiatry: Judgement and insight appear normal. Mood & affect appropriate.    Discharge Diagnoses:  Principal Problem:   Hypertensive crisis Active Problems:   Hyperkalemia   History of CVA (cerebrovascular accident)   Paroxysmal atrial fibrillation (HCC)   ESRD (end stage renal disease) (HCC)   Elevated troponin level not due to acute coronary syndrome   Chronic heart failure with mildly reduced ejection fraction (HFmrEF, 41-49%) (HCC)   History of COPD      Discharge Exam: Vitals:   08/11/23 0700 08/11/23 1048  BP: (!) 154/83 136/79  Pulse: 75 76  Resp: 20 20  Temp: 98.3 F (36.8 C) 98 F (36.7 C)  SpO2: 96% 96%  Vitals:   08/11/23 0303 08/11/23 0600 08/11/23 0700 08/11/23 1048  BP: (!) 164/96 (!) 173/101 (!) 154/83 136/79  Pulse: 76  75 76  Resp: 20  20 20   Temp: 99.5 F (37.5 C)  98.3 F  (36.8 C) 98 F (36.7 C)  TempSrc: Oral  Oral Oral  SpO2: 96%  96% 96%  Weight:      Height:          Discharge Instructions   Allergies as of 08/11/2023       Reactions   Shrimp [shellfish Allergy] Shortness Of Breath   Bactroban [mupirocin] Other (See Comments)   "Sores in nose"   Chlorhexidine Gluconate Itching   Hydrocodone Itching, Nausea And Vomiting, Nausea Only   Latex Itching   Latex gloves cause itchiness and a rash   Tylenol [acetaminophen] Itching   Takes Percocet at home 05/06/23   Zestril [lisinopril] Cough        Medication List     TAKE these medications    albuterol (2.5 MG/3ML) 0.083% nebulizer solution Commonly known as: PROVENTIL Take 2.5 mg by nebulization every 6 (six) hours as needed for wheezing or shortness of breath.   albuterol 108 (90 Base) MCG/ACT inhaler Commonly known as: VENTOLIN HFA Inhale 2 puffs into the lungs every 6 (six) hours as needed for wheezing or shortness of breath.   amLODipine 10 MG tablet Commonly known as: NORVASC Take 1 tablet (10 mg total) by mouth daily.   apixaban 2.5 MG Tabs tablet Commonly known as: ELIQUIS Take 1 tablet (2.5 mg total) by mouth 2 (two) times daily.   carvedilol 25 MG tablet Commonly known as: COREG Take 1 tablet (25 mg total) by mouth 2 (two) times daily with a meal.   cloNIDine 0.1 MG tablet Commonly known as: Catapres Take 1 tablet (0.1 mg total) by mouth 3 (three) times daily.   hydrALAZINE 100 MG tablet Commonly known as: APRESOLINE Take 1 tablet (100 mg total) by mouth every 8 (eight) hours.   isosorbide mononitrate 30 MG 24 hr tablet Commonly known as: IMDUR TAKE 1 TABLET (30MG ) ON DIALYSIS DAYS AND 2 TABLETS (60 MG) ON NON DIALYSIS DAYS.   Lokelma 10 g Pack packet Generic drug: sodium zirconium cyclosilicate Take by mouth.   MIRCERA IJ every 14 (fourteen) days.   naloxone 4 MG/0.1ML Liqd nasal spray kit Commonly known as: NARCAN Place 1 spray into the nose as needed  (opioid overdose).   oxyCODONE-acetaminophen 10-325 MG tablet Commonly known as: PERCOCET Take 1 tablet by mouth in the morning and at bedtime.   pantoprazole 40 MG tablet Commonly known as: PROTONIX TAKE 1 TABLET (40 MG TOTAL) BY MOUTH 2 (TWO) TIMES DAILY.   rosuvastatin 10 MG tablet Commonly known as: CRESTOR TAKE 1 TABLET (10 MG TOTAL) BY MOUTH DAILY FOR CHOLESTEROL   senna-docusate 8.6-50 MG tablet Commonly known as: Senokot-S Take 1 tablet by mouth 2 (two) times daily between meals as needed for mild constipation.   sevelamer carbonate 2.4 g Pack Commonly known as: RENVELA Take 2.4 g by mouth 3 (three) times daily with meals.   traZODone 50 MG tablet Commonly known as: DESYREL Take 25-50 mg by mouth at bedtime as needed for sleep.   VITAMIN D-3 PO Take 1 capsule by mouth daily.        Allergies  Allergen Reactions   Shrimp [Shellfish Allergy] Shortness Of Breath   Bactroban [Mupirocin] Other (See Comments)    "Sores in nose"   Chlorhexidine Gluconate  Itching   Hydrocodone Itching, Nausea And Vomiting and Nausea Only   Latex Itching    Latex gloves cause itchiness and a rash   Tylenol [Acetaminophen] Itching    Takes Percocet at home 05/06/23   Zestril [Lisinopril] Cough    You were cared for by a hospitalist during your hospital stay. If you have any questions about your discharge medications or the care you received while you were in the hospital after you are discharged, you can call the unit and asked to speak with the hospitalist on call if the hospitalist that took care of you is not available. Once you are discharged, your primary care physician will handle any further medical issues. Please note that no refills for any discharge medications will be authorized once you are discharged, as it is imperative that you return to your primary care physician (or establish a relationship with a primary care physician if you do not have one) for your aftercare needs so  that they can reassess your need for medications and monitor your lab values.  You were cared for by a hospitalist during your hospital stay. If you have any questions about your discharge medications or the care you received while you were in the hospital after you are discharged, you can call the unit and asked to speak with the hospitalist on call if the hospitalist that took care of you is not available. Once you are discharged, your primary care physician will handle any further medical issues. Please note that NO REFILLS for any discharge medications will be authorized once you are discharged, as it is imperative that you return to your primary care physician (or establish a relationship with a primary care physician if you do not have one) for your aftercare needs so that they can reassess your need for medications and monitor your lab values.  Please request your Prim.MD to go over all Hospital Tests and Procedure/Radiological results at the follow up, please get all Hospital records sent to your Prim MD by signing hospital release before you go home.  Get CBC, CMP, 2 view Chest X ray checked  by Primary MD during your next visit or SNF MD in 5-7 days ( we routinely change or add medications that can affect your baseline labs and fluid status, therefore we recommend that you get the mentioned basic workup next visit with your PCP, your PCP may decide not to get them or add new tests based on their clinical decision)  On your next visit with your primary care physician please Get Medicines reviewed and adjusted.  If you experience worsening of your admission symptoms, develop shortness of breath, life threatening emergency, suicidal or homicidal thoughts you must seek medical attention immediately by calling 911 or calling your MD immediately  if symptoms less severe.  You Must read complete instructions/literature along with all the possible adverse reactions/side effects for all the Medicines  you take and that have been prescribed to you. Take any new Medicines after you have completely understood and accpet all the possible adverse reactions/side effects.   Do not drive, operate heavy machinery, perform activities at heights, swimming or participation in water activities or provide baby sitting services if your were admitted for syncope or siezures until you have seen by Primary MD or a Neurologist and advised to do so again.  Do not drive when taking Pain medications.   Procedures/Studies: DG Elbow Complete Right Result Date: 08/10/2023 CLINICAL DATA:  Elbow pain post fall rt humerus  performed earlier Pain post fall EXAM: RIGHT ELBOW - COMPLETE 3+ VIEW COMPARISON:  None Available. FINDINGS: There is no evidence of fracture, dislocation, or joint effusion. There is no evidence of severe arthropathy or other focal bone abnormality. Soft tissues are unremarkable. IMPRESSION: No acute displaced fracture or dislocation. Electronically Signed   By: Tish Frederickson M.D.   On: 08/10/2023 00:26   CT Cervical Spine Wo Contrast Result Date: 08/10/2023 CLINICAL DATA:  Neck trauma (Age >= 65y); Back trauma, no prior imaging (Age >= 16y) Fall onto back EXAM: CT CERVICAL, THORACIC, AND LUMBAR SPINE WITHOUT CONTRAST TECHNIQUE: Multidetector CT imaging of the cervical, thoracic and lumbar spine was performed without intravenous contrast. Multiplanar CT image reconstructions were also generated. RADIATION DOSE REDUCTION: This exam was performed according to the departmental dose-optimization program which includes automated exposure control, adjustment of the mA and/or kV according to patient size and/or use of iterative reconstruction technique. COMPARISON:  Chest x-ray 08/09/2023 FINDINGS: CT CERVICAL SPINE FINDINGS Alignment: Normal. Skull base and vertebrae: Multilevel severe degenerative changes of the spine. No associated severe osseous foraminal or central canal stenosis. No acute fracture. No  aggressive appearing focal osseous lesion or focal pathologic process. Soft tissues and spinal canal: No prevertebral fluid or swelling. No visible canal hematoma. Other: None. CT THORACIC SPINE FINDINGS Alignment: Normal. Vertebrae: Multilevel moderate degenerative changes of the spine. No associated severe osseous neural foraminal or central canal stenosis. No acute fracture or focal pathologic process. Paraspinal and other soft tissues: Negative. Disc levels: Maintained. CT LUMBAR SPINE FINDINGS Segmentation: 5 lumbar type vertebrae. Alignment: Normal. Vertebrae: No acute fracture or focal pathologic process. L2-L3 left and interbody surgical hardware fusion. L4-L5 multilevel moderate degenerative changes of the spine. No CT evidence of surgical hardware complication. No associated severe osseous neural foraminal or central canal stenosis. Posterior element surgical hardware. Paraspinal and other soft tissues: Negative. Disc levels: Multilevel intervertebral disc space vacuum phenomenon. Other: Trace to small right pleural effusion. Limited evaluation of the lungs due to motion artifact. Diffuse bronchial wall thickening. Question developing peribronchovascular ground-glass airspace opacities. Cardiomegaly. 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 Severe atherosclerotic plaque. Right chest wall dialysis catheter with tip within the right atrium in the region of the inferior cavoatrial junction. Calcified uterine fibroids. IMPRESSION: 1. No acute displaced fracture or traumatic listhesis of the cervical, thoracic, lumbar spine. 2. Trace to small right pleural effusion. Not well visualized on chest x-ray 08/09/2023. 3. Diffuse bronchial wall thickening. Question developing peribronchovascular ground-glass airspace opacities. Limited evaluation due to motion artifact. Developing infection/inflammation not excluded. 4.  Aortic Atherosclerosis (ICD10-I70.0). 5. Aneurysmal ascending thoracic aorta  (4.2 cm). Recommend annual imaging followup by CTA or MRA. This recommendation follows 2010 ACCF/AHA/AATS/ACR/ASA/SCA/SCAI/SIR/STS/SVM Guidelines for the Diagnosis and Management of Patients with Thoracic Aortic Disease. Circulation. 2010; 121: Z610-R604. Aortic aneurysm NOS (ICD10-I71.9). Electronically Signed   By: Tish Frederickson M.D.   On: 08/10/2023 00:25   CT Thoracic Spine Wo Contrast Result Date: 08/10/2023 CLINICAL DATA:  Neck trauma (Age >= 65y); Back trauma, no prior imaging (Age >= 16y) Fall onto back EXAM: CT CERVICAL, THORACIC, AND LUMBAR SPINE WITHOUT CONTRAST TECHNIQUE: Multidetector CT imaging of the cervical, thoracic and lumbar spine was performed without intravenous contrast. Multiplanar CT image reconstructions were also generated. RADIATION DOSE REDUCTION: This exam was performed according to the departmental dose-optimization program which includes automated exposure control, adjustment of the mA and/or kV according to patient size and/or use of iterative reconstruction technique. COMPARISON:  Chest x-ray  08/09/2023 FINDINGS: CT CERVICAL SPINE FINDINGS Alignment: Normal. Skull base and vertebrae: Multilevel severe degenerative changes of the spine. No associated severe osseous foraminal or central canal stenosis. No acute fracture. No aggressive appearing focal osseous lesion or focal pathologic process. Soft tissues and spinal canal: No prevertebral fluid or swelling. No visible canal hematoma. Other: None. CT THORACIC SPINE FINDINGS Alignment: Normal. Vertebrae: Multilevel moderate degenerative changes of the spine. No associated severe osseous neural foraminal or central canal stenosis. No acute fracture or focal pathologic process. Paraspinal and other soft tissues: Negative. Disc levels: Maintained. CT LUMBAR SPINE FINDINGS Segmentation: 5 lumbar type vertebrae. Alignment: Normal. Vertebrae: No acute fracture or focal pathologic process. L2-L3 left and interbody surgical hardware  fusion. L4-L5 multilevel moderate degenerative changes of the spine. No CT evidence of surgical hardware complication. No associated severe osseous neural foraminal or central canal stenosis. Posterior element surgical hardware. Paraspinal and other soft tissues: Negative. Disc levels: Multilevel intervertebral disc space vacuum phenomenon. Other: Trace to small right pleural effusion. Limited evaluation of the lungs due to motion artifact. Diffuse bronchial wall thickening. Question developing peribronchovascular ground-glass airspace opacities. Cardiomegaly. 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 Severe atherosclerotic plaque. Right chest wall dialysis catheter with tip within the right atrium in the region of the inferior cavoatrial junction. Calcified uterine fibroids. IMPRESSION: 1. No acute displaced fracture or traumatic listhesis of the cervical, thoracic, lumbar spine. 2. Trace to small right pleural effusion. Not well visualized on chest x-ray 08/09/2023. 3. Diffuse bronchial wall thickening. Question developing peribronchovascular ground-glass airspace opacities. Limited evaluation due to motion artifact. Developing infection/inflammation not excluded. 4.  Aortic Atherosclerosis (ICD10-I70.0). 5. 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 Recommend annual imaging followup by CTA or MRA. This recommendation follows 2010 ACCF/AHA/AATS/ACR/ASA/SCA/SCAI/SIR/STS/SVM Guidelines for the Diagnosis and Management of Patients with Thoracic Aortic Disease. Circulation. 2010; 121: U725-D664. Aortic aneurysm NOS (ICD10-I71.9). Electronically Signed   By: Tish Frederickson M.D.   On: 08/10/2023 00:25   CT Lumbar Spine Wo Contrast Result Date: 08/10/2023 CLINICAL DATA:  Neck trauma (Age >= 65y); Back trauma, no prior imaging (Age >= 16y) Fall onto back EXAM: CT CERVICAL, THORACIC, AND LUMBAR SPINE WITHOUT CONTRAST TECHNIQUE: Multidetector CT imaging of the cervical, thoracic and lumbar spine was  performed without intravenous contrast. Multiplanar CT image reconstructions were also generated. RADIATION DOSE REDUCTION: This exam was performed according to the departmental dose-optimization program which includes automated exposure control, adjustment of the mA and/or kV according to patient size and/or use of iterative reconstruction technique. COMPARISON:  Chest x-ray 08/09/2023 FINDINGS: CT CERVICAL SPINE FINDINGS Alignment: Normal. Skull base and vertebrae: Multilevel severe degenerative changes of the spine. No associated severe osseous foraminal or central canal stenosis. No acute fracture. No aggressive appearing focal osseous lesion or focal pathologic process. Soft tissues and spinal canal: No prevertebral fluid or swelling. No visible canal hematoma. Other: None. CT THORACIC SPINE FINDINGS Alignment: Normal. Vertebrae: Multilevel moderate degenerative changes of the spine. No associated severe osseous neural foraminal or central canal stenosis. No acute fracture or focal pathologic process. Paraspinal and other soft tissues: Negative. Disc levels: Maintained. CT LUMBAR SPINE FINDINGS Segmentation: 5 lumbar type vertebrae. Alignment: Normal. Vertebrae: No acute fracture or focal pathologic process. L2-L3 left and interbody surgical hardware fusion. L4-L5 multilevel moderate degenerative changes of the spine. No CT evidence of surgical hardware complication. No associated severe osseous neural foraminal or central canal stenosis. Posterior element surgical hardware. Paraspinal and other soft tissues: Negative. Disc levels: Multilevel intervertebral disc  space vacuum phenomenon. Other: Trace to small right pleural effusion. Limited evaluation of the lungs due to motion artifact. Diffuse bronchial wall thickening. Question developing peribronchovascular ground-glass airspace opacities. Cardiomegaly. 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 Severe atherosclerotic plaque. Right chest  wall dialysis catheter with tip within the right atrium in the region of the inferior cavoatrial junction. Calcified uterine fibroids. IMPRESSION: 1. No acute displaced fracture or traumatic listhesis of the cervical, thoracic, lumbar spine. 2. Trace to small right pleural effusion. Not well visualized on chest x-ray 08/09/2023. 3. Diffuse bronchial wall thickening. Question developing peribronchovascular ground-glass airspace opacities. Limited evaluation due to motion artifact. Developing infection/inflammation not excluded. 4.  Aortic Atherosclerosis (ICD10-I70.0). 5. 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 Recommend annual imaging followup by CTA or MRA. This recommendation follows 2010 ACCF/AHA/AATS/ACR/ASA/SCA/SCAI/SIR/STS/SVM Guidelines for the Diagnosis and Management of Patients with Thoracic Aortic Disease. Circulation. 2010; 121: J478-G956. Aortic aneurysm NOS (ICD10-I71.9). Electronically Signed   By: Tish Frederickson M.D.   On: 08/10/2023 00:25   CT Head Wo Contrast Result Date: 08/10/2023 CLINICAL DATA:  Head trauma, minor (Age >= 65y).  Fall onto back. EXAM: CT HEAD WITHOUT CONTRAST TECHNIQUE: Contiguous axial images were obtained from the base of the skull through the vertex without intravenous contrast. RADIATION DOSE REDUCTION: This exam was performed according to the departmental dose-optimization program which includes automated exposure control, adjustment of the mA and/or kV according to patient size and/or use of iterative reconstruction technique. COMPARISON:  CT head 06/25/2023 FINDINGS: Brain: Cerebral ventricle sizes are concordant with the degree of cerebral volume loss. Chronic extensive patchy and confluent areas of decreased attenuation are noted throughout the deep and periventricular white matter of the cerebral hemispheres bilaterally, compatible with chronic microvascular ischemic disease. Left frontoparietal encephalomalacia. No evidence of large-territorial acute  infarction. No parenchymal hemorrhage. No mass lesion. No extra-axial collection. No mass effect or midline shift. No hydrocephalus. Basilar cisterns are patent. Vascular: No hyperdense vessel. Atherosclerotic calcifications are present within the cavernous internal carotid arteries. Skull: No acute fracture or focal lesion. Sinuses/Orbits: Paranasal sinuses and mastoid air cells are clear. The orbits are unremarkable. Other: None. IMPRESSION: No acute intracranial abnormality. Electronically Signed   By: Tish Frederickson M.D.   On: 08/10/2023 00:10   DG Humerus Right Result Date: 08/09/2023 CLINICAL DATA:  90955 Arm pain 21308 ShOB x 1 hour and hypertension. Pt is a dialysis pt with last full treatment on Saturday. Hx of stroke with R sided deficits." Best images obtainable d/t pt condition. EXAM: RIGHT HUMERUS - 2+ VIEW COMPARISON:  None Available. FINDINGS: Likely elbow joint effusion. There is no evidence of fracture or other focal bone lesions. Soft tissues are unremarkable. IMPRESSION: 1. Suggestion of an elbow joint effusion. Recommend dedicated 3 view radiograph of the elbow. 2.  No acute displaced fracture of the humerus. Electronically Signed   By: Tish Frederickson M.D.   On: 08/09/2023 23:24   DG Chest 2 View Result Date: 08/09/2023 CLINICAL DATA:  shob EXAM: CHEST - 2 VIEW COMPARISON:  CT chest 01/14/2023, chest x-ray 07/22/2023 FINDINGS: Right chest wall dialysis catheter with tip overlying the right atrium in the region of the inferior caval junction. Cardiomegaly. The heart and mediastinal contours are unchanged. Atherosclerotic plaque. Lingular atelectasis. No focal consolidation. Chronic coarse markings with no overt pulmonary edema. No pleural effusion. No pneumothorax. No acute osseous abnormality.  Sternotomy wires are intact. IMPRESSION: 1. No active cardiopulmonary disease. 2. Aortic Atherosclerosis (ICD10-I70.0) and Emphysema (ICD10-J43.9). Electronically Signed  By: Tish Frederickson M.D.    On: 08/09/2023 23:21   DG Chest Portable 1 View Result Date: 07/22/2023 CLINICAL DATA:  c/f infected permacath site. EXAM: PORTABLE CHEST 1 VIEW COMPARISON:  06/17/2023 FINDINGS: Low lung volume. There are mildly increased interstitial markings, less pronounced than the prior exam. There patchy atelectatic changes/scarring at the left lung base. Bilateral lung fields are otherwise clear. Bilateral costophrenic angles are clear. Note is made of elevated right hemidiaphragm. Stable cardio-mediastinal silhouette. Sternotomy wires noted. No acute osseous abnormalities. The soft tissues are within normal limits. Right IJ central venous catheter noted with its tip overlying the right atrium. IMPRESSION: *Mildly increased interstitial markings, less pronounced than the prior exam. No acute cardiopulmonary abnormality. Electronically Signed   By: Jules Schick M.D.   On: 07/22/2023 16:14   PYP Scan Result Date: 07/19/2023   Myocardial uptake was negative for radiotracer uptake. The visual grade of myocardial uptake relative to the ribs was Grade 0 (No myocardial uptake and normal bone uptake).   Findings are not suggestive (Grade 0) of cardiac ATTR amyloidosis.     The results of significant diagnostics from this hospitalization (including imaging, microbiology, ancillary and laboratory) are listed below for reference.     Microbiology: No results found for this or any previous visit (from the past 240 hours).   Labs: BNP (last 3 results) Recent Labs    04/02/23 0208 05/06/23 0333 06/18/23 0334  BNP >4,500.0* >4,500.0* >4,500.0*   Basic Metabolic Panel: Recent Labs  Lab 08/09/23 2101 08/10/23 0506 08/11/23 0225  NA 139 135 136  K 5.5* 5.6* 4.0  CL 95* 98 97*  CO2 25 20* 28  GLUCOSE 104* 108* 98  BUN 48* 56* 30*  CREATININE 9.46* 10.11* 6.17*  CALCIUM 9.8 8.9 9.0  MG  --   --  2.1  PHOS  --   --  5.9*   Liver Function Tests: Recent Labs  Lab 08/11/23 0225  AST 16  ALT 9   ALKPHOS 56  BILITOT 0.7  PROT 6.6  ALBUMIN 3.2*   No results for input(s): "LIPASE", "AMYLASE" in the last 168 hours. No results for input(s): "AMMONIA" in the last 168 hours. CBC: Recent Labs  Lab 08/09/23 2101 08/10/23 0506 08/11/23 0225  WBC 6.8 7.4 3.9*  NEUTROABS  --   --  2.3  HGB 12.4 11.4* 10.2*  HCT 40.2 36.0 32.3*  MCV 97.6 94.5 95.3  PLT 215 203 165   Cardiac Enzymes: No results for input(s): "CKTOTAL", "CKMB", "CKMBINDEX", "TROPONINI" in the last 168 hours. BNP: Invalid input(s): "POCBNP" CBG: Recent Labs  Lab 08/10/23 2340  GLUCAP 136*   D-Dimer No results for input(s): "DDIMER" in the last 72 hours. Hgb A1c No results for input(s): "HGBA1C" in the last 72 hours. Lipid Profile No results for input(s): "CHOL", "HDL", "LDLCALC", "TRIG", "CHOLHDL", "LDLDIRECT" in the last 72 hours. Thyroid function studies No results for input(s): "TSH", "T4TOTAL", "T3FREE", "THYROIDAB" in the last 72 hours.  Invalid input(s): "FREET3" Anemia work up No results for input(s): "VITAMINB12", "FOLATE", "FERRITIN", "TIBC", "IRON", "RETICCTPCT" in the last 72 hours. Urinalysis    Component Value Date/Time   COLORURINE AMBER (A) 12/27/2021 1840   APPEARANCEUR TURBID (A) 12/27/2021 1840   LABSPEC 1.013 12/27/2021 1840   PHURINE 8.0 12/27/2021 1840   GLUCOSEU NEGATIVE 12/27/2021 1840   GLUCOSEU NEG mg/dL 56/21/3086 5784   HGBUR MODERATE (A) 12/27/2021 1840   BILIRUBINUR NEGATIVE 12/27/2021 1840   BILIRUBINUR negative 05/04/2018 0910  KETONESUR NEGATIVE 12/27/2021 1840   PROTEINUR >=300 (A) 12/27/2021 1840   UROBILINOGEN 0.2 05/04/2018 0910   UROBILINOGEN 1 02/14/2014 0946   NITRITE NEGATIVE 12/27/2021 1840   LEUKOCYTESUR LARGE (A) 12/27/2021 1840   Sepsis Labs Recent Labs  Lab 08/09/23 2101 08/10/23 0506 08/11/23 0225  WBC 6.8 7.4 3.9*   Microbiology No results found for this or any previous visit (from the past 240 hours).   Time coordinating discharge:   I have spent 35 minutes face to face with the patient and on the ward discussing the patients care, assessment, plan and disposition with other care givers. >50% of the time was devoted counseling the patient about the risks and benefits of treatment/Discharge disposition and coordinating care.   SIGNED:   Miguel Rota, MD  [facility] 08/11/2023, 12:55 PM   If 7PM-7AM, please contact night-coverage

## 2023-08-21 ENCOUNTER — Emergency Department (HOSPITAL_COMMUNITY)

## 2023-08-21 ENCOUNTER — Inpatient Hospital Stay (HOSPITAL_COMMUNITY)
Admission: EM | Admit: 2023-08-21 | Discharge: 2023-08-23 | DRG: 304 | Disposition: A | Discharge status: Home / Self Care | Attending: Internal Medicine | Admitting: Internal Medicine

## 2023-08-21 ENCOUNTER — Other Ambulatory Visit: Payer: Self-pay

## 2023-08-21 ENCOUNTER — Encounter (HOSPITAL_COMMUNITY): Payer: Self-pay | Admitting: Emergency Medicine

## 2023-08-21 DIAGNOSIS — Z87891 Personal history of nicotine dependence: Secondary | ICD-10-CM

## 2023-08-21 DIAGNOSIS — Z8619 Personal history of other infectious and parasitic diseases: Secondary | ICD-10-CM

## 2023-08-21 DIAGNOSIS — E785 Hyperlipidemia, unspecified: Secondary | ICD-10-CM | POA: Diagnosis present

## 2023-08-21 DIAGNOSIS — Z91040 Latex allergy status: Secondary | ICD-10-CM

## 2023-08-21 DIAGNOSIS — I480 Paroxysmal atrial fibrillation: Secondary | ICD-10-CM | POA: Diagnosis present

## 2023-08-21 DIAGNOSIS — I161 Hypertensive emergency: Secondary | ICD-10-CM | POA: Diagnosis not present

## 2023-08-21 DIAGNOSIS — M1990 Unspecified osteoarthritis, unspecified site: Secondary | ICD-10-CM | POA: Diagnosis present

## 2023-08-21 DIAGNOSIS — Z923 Personal history of irradiation: Secondary | ICD-10-CM

## 2023-08-21 DIAGNOSIS — I4510 Unspecified right bundle-branch block: Secondary | ICD-10-CM | POA: Diagnosis present

## 2023-08-21 DIAGNOSIS — Z833 Family history of diabetes mellitus: Secondary | ICD-10-CM

## 2023-08-21 DIAGNOSIS — I69351 Hemiplegia and hemiparesis following cerebral infarction affecting right dominant side: Secondary | ICD-10-CM

## 2023-08-21 DIAGNOSIS — J449 Chronic obstructive pulmonary disease, unspecified: Secondary | ICD-10-CM

## 2023-08-21 DIAGNOSIS — R0602 Shortness of breath: Secondary | ICD-10-CM | POA: Diagnosis present

## 2023-08-21 DIAGNOSIS — E875 Hyperkalemia: Secondary | ICD-10-CM | POA: Diagnosis present

## 2023-08-21 DIAGNOSIS — Z981 Arthrodesis status: Secondary | ICD-10-CM

## 2023-08-21 DIAGNOSIS — J9601 Acute respiratory failure with hypoxia: Secondary | ICD-10-CM | POA: Diagnosis present

## 2023-08-21 DIAGNOSIS — I5022 Chronic systolic (congestive) heart failure: Secondary | ICD-10-CM | POA: Diagnosis present

## 2023-08-21 DIAGNOSIS — Z800 Family history of malignant neoplasm of digestive organs: Secondary | ICD-10-CM

## 2023-08-21 DIAGNOSIS — Z841 Family history of disorders of kidney and ureter: Secondary | ICD-10-CM

## 2023-08-21 DIAGNOSIS — Z8679 Personal history of other diseases of the circulatory system: Secondary | ICD-10-CM

## 2023-08-21 DIAGNOSIS — J189 Pneumonia, unspecified organism: Secondary | ICD-10-CM | POA: Diagnosis present

## 2023-08-21 DIAGNOSIS — Z992 Dependence on renal dialysis: Secondary | ICD-10-CM

## 2023-08-21 DIAGNOSIS — Z7901 Long term (current) use of anticoagulants: Secondary | ICD-10-CM

## 2023-08-21 DIAGNOSIS — N2581 Secondary hyperparathyroidism of renal origin: Secondary | ICD-10-CM | POA: Diagnosis present

## 2023-08-21 DIAGNOSIS — Z820 Family history of epilepsy and other diseases of the nervous system: Secondary | ICD-10-CM

## 2023-08-21 DIAGNOSIS — N186 End stage renal disease: Secondary | ICD-10-CM | POA: Diagnosis present

## 2023-08-21 DIAGNOSIS — Z885 Allergy status to narcotic agent status: Secondary | ICD-10-CM

## 2023-08-21 DIAGNOSIS — Z808 Family history of malignant neoplasm of other organs or systems: Secondary | ICD-10-CM

## 2023-08-21 DIAGNOSIS — Z883 Allergy status to other anti-infective agents status: Secondary | ICD-10-CM

## 2023-08-21 DIAGNOSIS — Z9889 Other specified postprocedural states: Secondary | ICD-10-CM

## 2023-08-21 DIAGNOSIS — Z886 Allergy status to analgesic agent status: Secondary | ICD-10-CM

## 2023-08-21 DIAGNOSIS — K219 Gastro-esophageal reflux disease without esophagitis: Secondary | ICD-10-CM | POA: Diagnosis present

## 2023-08-21 DIAGNOSIS — D631 Anemia in chronic kidney disease: Secondary | ICD-10-CM | POA: Diagnosis present

## 2023-08-21 DIAGNOSIS — F1410 Cocaine abuse, uncomplicated: Secondary | ICD-10-CM | POA: Diagnosis present

## 2023-08-21 DIAGNOSIS — Z888 Allergy status to other drugs, medicaments and biological substances status: Secondary | ICD-10-CM

## 2023-08-21 DIAGNOSIS — J440 Chronic obstructive pulmonary disease with acute lower respiratory infection: Secondary | ICD-10-CM | POA: Diagnosis present

## 2023-08-21 DIAGNOSIS — M549 Dorsalgia, unspecified: Secondary | ICD-10-CM | POA: Diagnosis present

## 2023-08-21 DIAGNOSIS — I491 Atrial premature depolarization: Secondary | ICD-10-CM | POA: Diagnosis present

## 2023-08-21 DIAGNOSIS — Z86000 Personal history of in-situ neoplasm of breast: Secondary | ICD-10-CM

## 2023-08-21 DIAGNOSIS — I700 Atherosclerosis of aorta: Secondary | ICD-10-CM | POA: Diagnosis present

## 2023-08-21 DIAGNOSIS — G8929 Other chronic pain: Secondary | ICD-10-CM | POA: Diagnosis present

## 2023-08-21 DIAGNOSIS — Z87442 Personal history of urinary calculi: Secondary | ICD-10-CM

## 2023-08-21 DIAGNOSIS — Z91013 Allergy to seafood: Secondary | ICD-10-CM

## 2023-08-21 DIAGNOSIS — Z79899 Other long term (current) drug therapy: Secondary | ICD-10-CM

## 2023-08-21 DIAGNOSIS — Z95828 Presence of other vascular implants and grafts: Secondary | ICD-10-CM

## 2023-08-21 DIAGNOSIS — Z853 Personal history of malignant neoplasm of breast: Secondary | ICD-10-CM

## 2023-08-21 DIAGNOSIS — Z8249 Family history of ischemic heart disease and other diseases of the circulatory system: Secondary | ICD-10-CM

## 2023-08-21 DIAGNOSIS — Z9012 Acquired absence of left breast and nipple: Secondary | ICD-10-CM

## 2023-08-21 DIAGNOSIS — Z86718 Personal history of other venous thrombosis and embolism: Secondary | ICD-10-CM

## 2023-08-21 DIAGNOSIS — J441 Chronic obstructive pulmonary disease with (acute) exacerbation: Secondary | ICD-10-CM | POA: Diagnosis present

## 2023-08-21 DIAGNOSIS — I132 Hypertensive heart and chronic kidney disease with heart failure and with stage 5 chronic kidney disease, or end stage renal disease: Secondary | ICD-10-CM | POA: Diagnosis present

## 2023-08-21 LAB — BASIC METABOLIC PANEL
Anion gap: 16 — ABNORMAL HIGH (ref 5–15)
BUN: 39 mg/dL — ABNORMAL HIGH (ref 8–23)
CO2: 26 mmol/L (ref 22–32)
Calcium: 8.9 mg/dL (ref 8.9–10.3)
Chloride: 97 mmol/L — ABNORMAL LOW (ref 98–111)
Creatinine, Ser: 8.81 mg/dL — ABNORMAL HIGH (ref 0.44–1.00)
GFR, Estimated: 4 mL/min — ABNORMAL LOW (ref >60)
Glucose, Bld: 196 mg/dL — ABNORMAL HIGH (ref 70–99)
Potassium: 4.2 mmol/L (ref 3.5–5.1)
Sodium: 139 mmol/L (ref 135–145)

## 2023-08-21 LAB — COMPREHENSIVE METABOLIC PANEL
ALT: 11 U/L (ref 0–44)
AST: 33 U/L (ref 15–41)
Albumin: 4.1 g/dL (ref 3.5–5.0)
Alkaline Phosphatase: 71 U/L (ref 38–126)
Anion gap: 16 — ABNORMAL HIGH (ref 5–15)
BUN: 36 mg/dL — ABNORMAL HIGH (ref 8–23)
CO2: 26 mmol/L (ref 22–32)
Calcium: 9.2 mg/dL (ref 8.9–10.3)
Chloride: 96 mmol/L — ABNORMAL LOW (ref 98–111)
Creatinine, Ser: 8.5 mg/dL — ABNORMAL HIGH (ref 0.44–1.00)
GFR, Estimated: 5 mL/min — ABNORMAL LOW (ref >60)
Glucose, Bld: 112 mg/dL — ABNORMAL HIGH (ref 70–99)
Potassium: 6 mmol/L — ABNORMAL HIGH (ref 3.5–5.1)
Sodium: 138 mmol/L (ref 135–145)
Total Bilirubin: 0.9 mg/dL (ref 0.0–1.2)
Total Protein: 7.7 g/dL (ref 6.5–8.1)

## 2023-08-21 LAB — CBC WITH DIFFERENTIAL/PLATELET
Abs Immature Granulocytes: 0.02 10*3/uL (ref 0.00–0.07)
Basophils Absolute: 0.1 10*3/uL (ref 0.0–0.1)
Basophils Relative: 1 %
Eosinophils Absolute: 0.2 10*3/uL (ref 0.0–0.5)
Eosinophils Relative: 4 %
HCT: 40.7 % (ref 36.0–46.0)
Hemoglobin: 12.6 g/dL (ref 12.0–15.0)
Immature Granulocytes: 0 %
Lymphocytes Relative: 11 %
Lymphs Abs: 0.6 10*3/uL — ABNORMAL LOW (ref 0.7–4.0)
MCH: 30.1 pg (ref 26.0–34.0)
MCHC: 31 g/dL (ref 30.0–36.0)
MCV: 97.1 fL (ref 80.0–100.0)
Monocytes Absolute: 0.5 10*3/uL (ref 0.1–1.0)
Monocytes Relative: 9 %
Neutro Abs: 4.5 10*3/uL (ref 1.7–7.7)
Neutrophils Relative %: 75 %
Platelets: 276 10*3/uL (ref 150–400)
RBC: 4.19 MIL/uL (ref 3.87–5.11)
RDW: 19.7 % — ABNORMAL HIGH (ref 11.5–15.5)
WBC: 6 10*3/uL (ref 4.0–10.5)
nRBC: 0 % (ref 0.0–0.2)

## 2023-08-21 LAB — PHOSPHORUS: Phosphorus: 6.4 mg/dL — ABNORMAL HIGH (ref 2.5–4.6)

## 2023-08-21 LAB — MAGNESIUM: Magnesium: 2.4 mg/dL (ref 1.7–2.4)

## 2023-08-21 LAB — TROPONIN I (HIGH SENSITIVITY)
Troponin I (High Sensitivity): 75 ng/L — ABNORMAL HIGH (ref <18)
Troponin I (High Sensitivity): 70 ng/L — ABNORMAL HIGH (ref <18)

## 2023-08-21 LAB — GLUCOSE, CAPILLARY: Glucose-Capillary: 197 mg/dL — ABNORMAL HIGH (ref 70–99)

## 2023-08-21 LAB — BRAIN NATRIURETIC PEPTIDE: B Natriuretic Peptide: >4500 pg/mL — ABNORMAL HIGH (ref 0.0–100.0)

## 2023-08-21 LAB — HEPATITIS B SURFACE ANTIGEN: Hepatitis B Surface Ag: NONREACTIVE

## 2023-08-21 MED ORDER — CARVEDILOL 12.5 MG PO TABS
25.0000 mg | ORAL_TABLET | Freq: Once | ORAL | Status: AC
  Administered 2023-08-21: 25 mg via ORAL
  Filled 2023-08-21: qty 2

## 2023-08-21 MED ORDER — HEPARIN SODIUM (PORCINE) 5000 UNIT/ML IJ SOLN: 5000.0000 [IU] | Freq: Two times a day (BID) | INTRAMUSCULAR | Status: DC

## 2023-08-21 MED ORDER — SODIUM CHLORIDE 0.9% FLUSH
10.0000 mL | Freq: Two times a day (BID) | INTRAVENOUS | Status: DC
Start: 2023-08-21 — End: 2023-08-23

## 2023-08-21 MED ORDER — CARVEDILOL 12.5 MG PO TABS: 25.0000 mg | ORAL_TABLET | Freq: Two times a day (BID) | ORAL | Status: DC

## 2023-08-21 MED ORDER — ALBUTEROL SULFATE (2.5 MG/3ML) 0.083% IN NEBU
2.5000 mg | INHALATION_SOLUTION | Freq: Four times a day (QID) | RESPIRATORY_TRACT | Status: DC | PRN
Start: 2023-08-21 — End: 2023-08-22

## 2023-08-21 MED ORDER — HEPARIN SODIUM (PORCINE) 1000 UNIT/ML IJ SOLN
2600.0000 [IU] | INTRAMUSCULAR | Status: DC
  Administered 2023-08-21: 3800 [IU]
  Filled 2023-08-21: qty 3

## 2023-08-21 MED ORDER — SODIUM ZIRCONIUM CYCLOSILICATE 10 G PO PACK
10.0000 g | PACK | Freq: Once | ORAL | Status: AC
  Administered 2023-08-21: 10 g via ORAL
  Filled 2023-08-21: qty 1

## 2023-08-21 MED ORDER — MORPHINE SULFATE (PF) 2 MG/ML IV SOLN
2.0000 mg | INTRAVENOUS | Status: DC | PRN
  Administered 2023-08-21: 2 mg via INTRAVENOUS
  Filled 2023-08-21: qty 1

## 2023-08-21 MED ORDER — IOHEXOL 350 MG/ML SOLN
75.0000 mL | Freq: Once | INTRAVENOUS | Status: AC | PRN
  Administered 2023-08-21: 75 mL via INTRAVENOUS

## 2023-08-21 MED ORDER — SODIUM CHLORIDE 0.9% FLUSH: 10.0000 mL | INTRAVENOUS | Status: DC | PRN

## 2023-08-21 MED ORDER — HEPARIN SODIUM (PORCINE) 1000 UNIT/ML IJ SOLN: 2600.0000 [IU] | INTRAMUSCULAR | Status: DC

## 2023-08-21 MED ORDER — SEVELAMER CARBONATE 2.4 G PO PACK
2.4000 g | PACK | Freq: Three times a day (TID) | ORAL | Status: DC
  Administered 2023-08-21 – 2023-08-23 (×5): 2.4 g via ORAL
  Filled 2023-08-21 (×6): qty 1

## 2023-08-21 MED ORDER — HYDRALAZINE HCL 50 MG PO TABS
100.0000 mg | ORAL_TABLET | Freq: Two times a day (BID) | ORAL | Status: DC
  Administered 2023-08-21 – 2023-08-23 (×5): 100 mg via ORAL
  Filled 2023-08-21 (×5): qty 2

## 2023-08-21 MED ORDER — SODIUM ZIRCONIUM CYCLOSILICATE 10 G PO PACK
10.0000 g | PACK | Freq: Once | ORAL | Status: DC
  Filled 2023-08-21: qty 1

## 2023-08-21 MED ORDER — AMLODIPINE BESYLATE 5 MG PO TABS
10.0000 mg | ORAL_TABLET | Freq: Once | ORAL | Status: AC
  Administered 2023-08-21: 10 mg via ORAL
  Filled 2023-08-21: qty 2

## 2023-08-21 MED ORDER — INSULIN ASPART 100 UNIT/ML IV SOLN
5.0000 [IU] | Freq: Once | INTRAVENOUS | Status: AC
  Administered 2023-08-21: 5 [IU] via INTRAVENOUS

## 2023-08-21 MED ORDER — NITROGLYCERIN IN D5W 200-5 MCG/ML-% IV SOLN
0.0000 ug/min | INTRAVENOUS | Status: DC
Start: 2023-08-21 — End: 2023-08-21
  Administered 2023-08-21: 5 ug/min via INTRAVENOUS
  Filled 2023-08-21: qty 250

## 2023-08-21 MED ORDER — SODIUM CHLORIDE 0.9% FLUSH: 3.0000 mL | INTRAVENOUS | Status: DC | PRN

## 2023-08-21 MED ORDER — HYDRALAZINE HCL 20 MG/ML IJ SOLN
10.0000 mg | Freq: Once | INTRAMUSCULAR | Status: AC
  Administered 2023-08-21: 10 mg via INTRAVENOUS
  Filled 2023-08-21: qty 1

## 2023-08-21 MED ORDER — ALBUTEROL SULFATE (2.5 MG/3ML) 0.083% IN NEBU
2.5000 mg | INHALATION_SOLUTION | Freq: Four times a day (QID) | RESPIRATORY_TRACT | Status: DC
  Administered 2023-08-21: 2.5 mg via RESPIRATORY_TRACT
  Filled 2023-08-21: qty 3

## 2023-08-21 MED ORDER — APIXABAN 2.5 MG PO TABS
2.5000 mg | ORAL_TABLET | Freq: Two times a day (BID) | ORAL | Status: DC
  Administered 2023-08-21 – 2023-08-23 (×5): 2.5 mg via ORAL
  Filled 2023-08-21 (×5): qty 1

## 2023-08-21 MED ORDER — LABETALOL HCL 5 MG/ML IV SOLN
10.0000 mg | INTRAVENOUS | Status: DC | PRN
  Administered 2023-08-21 – 2023-08-22 (×2): 10 mg via INTRAVENOUS
  Filled 2023-08-21 (×2): qty 4

## 2023-08-21 MED ORDER — MORPHINE SULFATE (PF) 2 MG/ML IV SOLN
2.0000 mg | Freq: Once | INTRAVENOUS | Status: AC
  Administered 2023-08-21: 2 mg via INTRAVENOUS
  Filled 2023-08-21: qty 1

## 2023-08-21 MED ORDER — SODIUM CHLORIDE 0.9% FLUSH
3.0000 mL | Freq: Two times a day (BID) | INTRAVENOUS | Status: DC
  Administered 2023-08-21 – 2023-08-22 (×3): 3 mL via INTRAVENOUS

## 2023-08-21 MED ORDER — ALBUTEROL SULFATE HFA 108 (90 BASE) MCG/ACT IN AERS: 2.0000 | INHALATION_SPRAY | Freq: Four times a day (QID) | RESPIRATORY_TRACT | Status: DC | PRN

## 2023-08-21 MED ORDER — DEXTROSE 50 % IV SOLN
1.0000 | Freq: Once | INTRAVENOUS | Status: AC
  Administered 2023-08-21: 50 mL via INTRAVENOUS
  Filled 2023-08-21: qty 50

## 2023-08-21 MED ORDER — SODIUM CHLORIDE 0.9 % IV SOLN
2.0000 g | INTRAVENOUS | Status: DC
  Administered 2023-08-21 – 2023-08-22 (×2): 2 g via INTRAVENOUS
  Filled 2023-08-21 (×2): qty 20

## 2023-08-21 MED ORDER — SODIUM CHLORIDE 0.9% FLUSH
3.0000 mL | Freq: Two times a day (BID) | INTRAVENOUS | Status: DC
  Administered 2023-08-22: 10 mL via INTRAVENOUS
  Administered 2023-08-22 – 2023-08-23 (×2): 3 mL via INTRAVENOUS

## 2023-08-21 MED ORDER — CARVEDILOL 25 MG PO TABS
25.0000 mg | ORAL_TABLET | Freq: Two times a day (BID) | ORAL | Status: DC
  Administered 2023-08-21 – 2023-08-23 (×4): 25 mg via ORAL
  Filled 2023-08-21 (×4): qty 1

## 2023-08-21 MED ORDER — HYDROMORPHONE HCL 1 MG/ML IJ SOLN
0.5000 mg | Freq: Once | INTRAMUSCULAR | Status: AC
  Administered 2023-08-21: 0.5 mg via INTRAVENOUS
  Filled 2023-08-21: qty 1

## 2023-08-21 MED ORDER — HYDRALAZINE HCL 25 MG PO TABS: 100.0000 mg | ORAL_TABLET | Freq: Once | ORAL | Status: DC

## 2023-08-21 MED ORDER — CLONIDINE HCL 0.1 MG PO TABS
0.1000 mg | ORAL_TABLET | Freq: Three times a day (TID) | ORAL | Status: DC
Start: 2023-08-21 — End: 2023-08-23
  Administered 2023-08-21 – 2023-08-23 (×5): 0.1 mg via ORAL
  Filled 2023-08-21 (×6): qty 1

## 2023-08-21 MED ORDER — ROSUVASTATIN CALCIUM 5 MG PO TABS
10.0000 mg | ORAL_TABLET | Freq: Every day | ORAL | Status: DC
  Administered 2023-08-21 – 2023-08-23 (×3): 10 mg via ORAL
  Filled 2023-08-21 (×3): qty 2

## 2023-08-21 NOTE — H&P (Signed)
 History and Physical    Patient: Monique Henderson UEA:[id number] DOB: 1951/04/05 DOA: 08/21/2023 DOS: the patient was seen and examined on 08/21/2023 PCP: Fleet Contras, MD  Patient coming from: Home Chief complaint: Chief Complaint  Patient presents with   Shortness of Breath   HPI:  Monique Henderson is a 73 y.o. female with past medical history  of  ESRD on hemodialysis (TTS), A-fib on Eliquis, hypertension, hyperlipidemia, COPD, history of right breast cancer status postlumpectomy in 2014, GERD, hepatitis C treated in 2018, moderate protein calorie malnutrition, history of stroke with right-sided deficit, chronic lower back pain status post lumbar spinal fusion surgery in 2010, --presents today with shortness of breath and respiratory distress and respiratory failure and chest pain radiating to her back.  Unfortunate the patient has been hospitalized at least twice a month since November 2024 with complaints of shortness of breath and similar presentation.  Patient is compliant with her dialysis and gets her dialysis on Tuesday Thursday and Saturday.  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 She also has a history of cocaine use and accelerated hypertension   >>ED Course: Pt in ed is hypertensive, and stable on 3L [place] >>Vital signs in the ED were notable for the hypertensive urgency since arrival BP has been 230's systolic.  Vitals:   08/21/23 1100 08/21/23 1130 08/21/23 1200 08/21/23 1230  BP: (!) 214/135 (!) 221/122 (!) 213/117 (!) 216/126  Pulse:  90 90 86  Temp:      Resp:  (!) 36 (!) 30 (!) 29  Height:      Weight:      SpO2:  97% 98% 98%  TempSrc:      BMI (Calculated):        >>Labs were notable for the following: CMP showing hyperkalemia of 6.0 112.  36, CREATININE 8.50, anion gap of 16 normal LFTs. CBC is within normal limits with a hemoglobin of 12.6 platelets 276 and normal white count.  >>EKG: Independently reviewed: EKG showed sinus rhythm 91 PR 158 QRS of 139 QTc prolonged at 534 right bundle branch block left atrial enlargement PACs overall low voltage tracing. 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  Grade 2 diastolic dysfunction.  Please refer to complete report . >>Imaging and additional notable ED work-up:   >>While in the ED patient received the following: Medications  nitroGLYCERIN 50 mg in dextrose 5 % 250 mL (0.2 mg/mL) infusion (25 mcg/min Intravenous Infusion Verify 08/21/23 1133)  sodium chloride flush (NS) 0.9 % injection 3 mL (has no administration in time range)  sodium chloride flush (NS) 0.9 % injection 3-10 mL (has no administration in time range)  sodium chloride flush (NS) 0.9 % injection 3-10 mL (has no administration in time range)  albuterol (PROVENTIL) (2.5 MG/3ML) 0.083% nebulizer solution 2.5 mg (has no administration in time range)  apixaban (ELIQUIS) tablet 2.5 mg (has no administration in time range)  carvedilol (COREG) tablet 25 mg (has no administration in time range)  cloNIDine (CATAPRES) tablet 0.1 mg (has no administration in time range)  hydrALAZINE (APRESOLINE) tablet 100 mg (  has no administration in time range)  sodium zirconium cyclosilicate (LOKELMA) packet 10 g (has no administration in time range)  albuterol (VENTOLIN HFA) 108 (90 Base) MCG/ACT inhaler 2 puff (has no administration in time range)  rosuvastatin (CRESTOR) tablet 10 mg (has no administration in time range)  sevelamer carbonate (RENVELA) powder PACK 2.4 g (has no administration in time  range)  HYDROmorphone (DILAUDID) injection 0.5 mg (0.5 mg Intravenous Given 08/21/23 1004)  iohexol (OMNIPAQUE) 350 MG/ML injection 75 mL (75 mLs Intravenous Contrast Given 08/21/23 0958)  amLODipine (NORVASC) tablet 10 mg (10 mg Oral Given 08/21/23 1100)  carvedilol (COREG) tablet 25 mg (25 mg Oral Given 08/21/23 1100)  hydrALAZINE (APRESOLINE) injection 10 mg (10 mg Intravenous Given 08/21/23 1100)    ROS Past Medical History:  Diagnosis Date   Acute cardioembolic stroke (HCC) 02/26/2022   AF (paroxysmal atrial fibrillation) (HCC) 05/29/2019   on Coumadin   Aortic atherosclerosis (HCC) 07/05/2019   Aortic dissection (HCC) 04/04/2019   s/p repair   Bone spur 2008   Right calcaneal foot spur   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 Chevis Pretty; S/P re-excision of superior and inferior margins 10/27/2012.    Dissection of aorta (HCC) 04/03/2019   ESRD on hemodialysis (HCC) 05/29/2019   Essential hypertension 09/16/2006   GERD 09/16/2006   Hepatitis C    treated and RNA confirmed not detectable 01/2017   Insomnia 03/14/2015   Malnutrition of moderate degree 05/19/2019   Non compliance with medical treatment 12/04/2017   Normocytic anemia    With thrombocytosis   Osteoarthritis    Right ureteral stone 2002   S/P lumbar spinal fusion 01/18/2014   S/P lumbar decompressive laminectomy, fusion, and plating for lumbar spinal stenosis on 05/27/2009 by Dr. Tia Alert.  S/P anterolateral retroperitoneal  interbody fusion L2-3 utilizing a 8 mm peek interbody cage packed with morcellized allograft, and anterior lumbar plating L2-3 for recurrent disc herniation L2-3 with spinal stenosis on 01/18/2014 by Dr. Tia Alert.     Stroke (cerebrum) (HCC)    Stroke (HCC) 02/23/2022   SVT (supraventricular tachycardia) (HCC) 08/28/2019   Tobacco use disorder 04/19/2009   Uterine fibroid    Wears dentures    top   Past Surgical History:  Procedure Laterality Date   ANTERIOR LAT LUMBAR FUSION N/A 01/18/2014   Procedure: ANTERIOR LATERAL LUMBAR FUSION LUMBAR TWO-THREE;  Surgeon: Tia Alert, MD;  Location: [facility];  Service: Neurosurgery;  Laterality: N/A;   ANTERIOR LUMBAR FUSION  01/18/2014   AV FISTULA PLACEMENT Left 04/20/2019   Procedure: ARTERIOVENOUS (AV) FISTULA CREATION;  Surgeon: Maeola Harman, MD;  Location: [facility] OR;  Service: Vascular;  Laterality: Left;   BACK SURGERY     BREAST LUMPECTOMY Left 01/2003   BREAST LUMPECTOMY Right 2014   BREAST LUMPECTOMY WITH NEEDLE LOCALIZATION AND AXILLARY SENTINEL LYMPH NODE BX Right 10/13/2012   Procedure: BREAST LUMPECTOMY WITH NEEDLE LOCALIZATION;  Surgeon: Robyne Askew, MD;  Location: [facility];  Service: General;  Laterality: Right;  Right breast wire localized lumpectomy   DIALYSIS/PERMA CATHETER INSERTION N/A 05/28/2023   Procedure: DIALYSIS/PERMA CATHETER INSERTION;  Surgeon: Ethelene Hal, MD;  [facility] INV[facility] CV [facility];  Service: Cardiovascular;  Laterality: N/A;   INSERTION OF DIALYSIS CATHETER Right 04/20/2019   Procedure: INSERTION OF DIALYSIS CATHETER, right internal jugular;  Surgeon: Maeola Harman, MD;  Location: [facility] OR;  Service: Vascular;  Laterality: Right;   INSERTION OF DIALYSIS CATHETER Right 09/24/2020   Procedure: INSERTION OF TUNNELED DIALYSIS CATHETER;  Surgeon: Larina Earthly, MD;  Location: [facility] OR;  Service: Vascular;  Laterality: Right;   IR FLUORO GUIDE CV LINE RIGHT  09/22/2020   IR  THORACENTESIS ASP PLEURAL SPACE W/IMG GUIDE  05/19/2019   IR US GUIDE VASC ACCESS LEFT  09/22/2020   IR US GUIDE VASC ACCESS RIGHT  09/22/2020   IR VENOCAVAGRAM SVC  09/22/2020   LAMINECTOMY  05/27/2009   Lumbar decompressive laminectomy, fusion and plating for lumbar spinal stensosis   LIGATION OF ARTERIOVENOUS  FISTULA Left 09/24/2020   Procedure: LIGATION OF LEFT ARM ARTERIOVENOUS  FISTULA;  Surgeon: Larina Earthly, MD;  Location: [facility] OR;  Service: Vascular;  Laterality: Left;   LUMBAR LAMINECTOMY/DECOMPRESSION MICRODISCECTOMY Left 03/23/2013   Procedure: LUMBAR LAMINECTOMY/DECOMPRESSION MICRODISCECTOMY 1 LEVEL;  Surgeon: Tia Alert, MD;  Location: [facility];  Service: Neurosurgery;  Laterality: Left;  LUMBAR LAMINECTOMY/DECOMPRESSION MICRODISCECTOMY 1 LEVEL   MASTECTOMY, PARTIAL Left 02/26/2003   ; S/P re-excision of cranial and lateral margins 04/19/2003.    RE-EXCISION OF BREAST CANCER,SUPERIOR MARGINS Right 10/27/2012   Procedure: RE-EXCISION OF BREAST CANCER,SUPERIOR and inferior MARGINS;  Surgeon: Robyne Askew, MD;  Location: [facility] OR;  Service: General;  Laterality: Right;   RE-EXCISION OF BREAST LUMPECTOMY Left 04/2003   TEE WITHOUT CARDIOVERSION N/A 04/04/2019   Procedure: Transesophageal Echocardiogram (Tee);  Surgeon: Linden Dolin, MD;  Location: [facility] OR;  Service: Open Heart Surgery;  Laterality: N/A;   THORACIC AORTIC ANEURYSM REPAIR N/A 04/04/2019   Procedure: THORACIC ASCENDING ANEURYSM REPAIR (AAA)  USING 28 MM X 30 CM HEMASHIELD PLATINUM VASCULAR GRAFT;  Surgeon: Linden Dolin, MD;  Location: [facility] OR;  Service: Open Heart Surgery;  Laterality: N/A;    reports that she quit smoking about 3 years ago. Her smoking use included cigarettes. She started smoking about 47 years ago. She has a 11 pack-year smoking history. She has never used smokeless tobacco. She reports that she does not currently use alcohol after a past usage of about 2.0 standard drinks of alcohol per week. She  reports that she does not currently use drugs after having used the following drugs: Cocaine.  Allergies  Allergen Reactions   Shrimp [Shellfish Allergy] Shortness Of Breath   Bactroban [Mupirocin] Other (See Comments)    "Sores in nose"   Chlorhexidine Gluconate Itching   Hydrocodone Itching, Nausea And Vomiting and Nausea Only   Latex Itching    Latex gloves cause itchiness and a rash   Tylenol [Acetaminophen] Itching    Takes Percocet at home 05/06/23   Zestril [Lisinopril] Cough    Family History  Problem Relation Age of Onset   Colon cancer Mother 47   Hypertension Mother    Diabetes Sister 30   Hypertension Sister    Diabetes Brother    Hypertension Brother    Diabetes Brother    Hypertension Brother    Kidney disease Son        s/p renal transplant  Hypertension Son    Diabetes Son    Multiple sclerosis Son    Bone cancer Sister 72   Breast cancer Neg Hx    Cervical cancer Neg Hx     Prior to Admission medications   Medication Sig Start Date End Date Taking? Authorizing Provider  albuterol (PROVENTIL) (2.5 MG/3ML) 0.083% nebulizer solution Take 2.5 mg by nebulization every 6 (six) hours as needed for wheezing or shortness of breath.    [provider]  albuterol (VENTOLIN HFA) 108 (90 Base) MCG/ACT inhaler Inhale 2 puffs into the lungs every 6 (six) hours as needed for wheezing or shortness of breath. 07/30/20   Janeece Agee, NP  amLODipine (NORVASC) 10 MG tablet Take 1 tablet (10 mg total) by mouth daily. 03/06/22   Setzer, Lynnell Jude, PA-C  apixaban (ELIQUIS) 2.5 MG TABS tablet Take 1 tablet (2.5 mg total) by mouth 2 (two) times daily. 03/06/22   Setzer, Lynnell Jude, PA-C  carvedilol (COREG) 25 MG tablet Take 1 tablet (25 mg total) by mouth 2 (two) times daily with a meal. 05/01/22 06/16/24  Ghimire, Werner Lean, MD  Cholecalciferol (VITAMIN D-3 PO) Take 1 capsule by mouth daily.    [provider]  cloNIDine (CATAPRES) 0.1 MG tablet Take 1 tablet (0.1 mg  total) by mouth 3 (three) times daily. 06/19/23   Hughie Closs, MD  hydrALAZINE (APRESOLINE) 100 MG tablet Take 1 tablet (100 mg total) by mouth every 8 (eight) hours. 12/18/22   Corky Crafts, MD  isosorbide mononitrate (IMDUR) 30 MG 24 hr tablet TAKE 1 TABLET (30MG ) ON DIALYSIS DAYS AND 2 TABLETS (60 MG) ON NON DIALYSIS DAYS. 01/12/23   Corky Crafts, MD  LOKELMA 10 g PACK packet Take by mouth. 07/30/23   [provider]  Methoxy PEG-Epoetin Beta (MIRCERA IJ) every 14 (fourteen) days. 07/06/23 07/04/24  [provider]  naloxone [facility]) nasal spray 4 mg/0.1 mL Place 1 spray into the nose as needed (opioid overdose). 02/19/22   [provider]  oxyCODONE-acetaminophen (PERCOCET) 10-325 MG tablet Take 1 tablet by mouth in the morning and at bedtime. 03/12/22   [provider]  pantoprazole (PROTONIX) 40 MG tablet TAKE 1 TABLET (40 MG TOTAL) BY MOUTH 2 (TWO) TIMES DAILY. 05/13/23   Georganna Skeans, MD  rosuvastatin (CRESTOR) 10 MG tablet TAKE 1 TABLET (10 MG TOTAL) BY MOUTH DAILY FOR CHOLESTEROL 08/07/22   Georganna Skeans, MD  senna-docusate (SENOKOT-S) 8.6-50 MG tablet Take 1 tablet by mouth 2 (two) times daily between meals as needed for mild constipation. 08/14/22   Almon Hercules, MD  sevelamer carbonate (RENVELA) 2.4 g PACK Take 2.4 g by mouth 3 (three) times daily with meals. 03/06/22   Setzer, Lynnell Jude, PA-C  traZODone (DESYREL) 50 MG tablet Take 25-50 mg by mouth at bedtime as needed for sleep. 03/19/23   [provider]                                                                                   Vitals:   08/21/23 1100 08/21/23 1130 08/21/23 1200 08/21/23 1230  BP: (!) 214/135 (!) 221/122 (!) 213/117 (!) 216/126  Pulse:  90  90 86  Resp:  (!) 36 (!) 30 (!) 29  Temp:      TempSrc:      SpO2:  97% 98% 98%  Weight:      Height:       Physical Exam   Labs on Admission: I have personally reviewed following labs and imaging  studies  CBC: Recent Labs  Lab 08/21/23 0823  WBC 6.0  NEUTROABS 4.5  HGB 12.6  HCT 40.7  MCV 97.1  PLT 276   Basic Metabolic Panel: Recent Labs  Lab 08/21/23 0823  NA 138  K 6.0*  CL 96*  CO2 26  GLUCOSE 112*  BUN 36*  CREATININE 8.50*  CALCIUM 9.2   GFR: Estimated Creatinine Clearance: 4.3 mL/min (A) (by C-G formula based on SCr of 8.5 mg/dL (H)). Liver Function Tests: Recent Labs  Lab 08/21/23 0823  AST 33  ALT 11  ALKPHOS 71  BILITOT 0.9  PROT 7.7  ALBUMIN 4.1   Radiological Exams on Admission: CT Angio Chest/Abd/Pel for Dissection W and/or Wo Contrast Result Date: 08/21/2023 CLINICAL DATA:  Several day history of cough, chills, and new shortness of breath with hypoxia EXAM: CT ANGIOGRAPHY CHEST, ABDOMEN AND PELVIS TECHNIQUE: Non-contrast CT of the chest was initially obtained. Multidetector CT imaging through the chest, abdomen and pelvis was performed using the standard protocol during bolus administration of intravenous contrast. Multiplanar reconstructed images and MIPs were obtained and reviewed to evaluate the vascular anatomy. RADIATION DOSE REDUCTION: This exam was performed according to the departmental dose-optimization program which includes automated exposure control, adjustment of the mA and/or kV according to patient size and/or use of iterative reconstruction technique. CONTRAST:  75mL OMNIPAQUE IOHEXOL 350 MG/ML SOLN COMPARISON:  CTA chest, abdomen, and pelvis dated 01/14/2023 and multiple priors FINDINGS: CTA CHEST FINDINGS Cardiovascular: Right IJ central venous catheter terminates in the right atrium. Preferential opacification of the thoracic aorta. Similar postsurgical changes of the ascending aorta measuring 4.6 x 4.1 cm (7:65, remeasured). No evidence of thoracic aortic dissection. Similar multichamber cardiomegaly. No pericardial effusion. Coronary artery calcifications. 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 Reflux of contrast material into the hepatic veins, suggesting a degree of right heart dysfunction. Mediastinum/Nodes: Imaged thyroid gland without nodules meeting criteria for imaging follow-up by size. Normal esophagus. Unchanged 10 mm right hilar lymph node (7:58) and 17 mm subcarinal lymph node (7:62). Lungs/Pleura: The central airways are patent. Diffuse interlobular septal thickening, right-greater-than-left. Patchy right lower lobe ground-glass opacities. Asymmetric right bronchial wall thickening. No pneumothorax. Unchanged moderate right and trace left pleural effusions. Musculoskeletal: No acute or abnormal lytic or blastic osseous lesions. Median sternotomy wires are nondisplaced. Multilevel degenerative changes of the thoracic spine. Flowing anterior osteophytes over at least 4 contiguous thoracic vertebrae which can be seen in the setting of diffuse idiopathic skeletal hyperostosis (DISH). Review of the MIP images confirms the above findings. CTA ABDOMEN AND PELVIS FINDINGS VASCULAR Severe aortic and branch atherosclerosis. Normal caliber of the abdominal aorta. No evidence of aortic aneurysm, dissection, or acute aortic abnormality. NON-VASCULAR Hepatobiliary: No focal hepatic lesions. No intra or extrahepatic biliary ductal dilation. Normal gallbladder. Pancreas: No focal lesions or main ductal dilation. Spleen: Normal in size without focal abnormality. Adrenals/Urinary Tract: No adrenal nodules. Atrophic kidneys without hydronephrosis. 2.6 cm exophytic right upper pole hypodensity (7:157), increased in size from 1.8 cm on 11/18/2021. Simple-appearing cyst in the lower pole left  kidney and subcentimeter hyperdense cyst in the upper pole. No specific follow-up imaging recommended. No focal bladder wall thickening. Stomach/Bowel: Normal appearance of the stomach. No evidence of bowel wall thickening, distention,  or inflammatory changes. Colonic diverticulosis without acute diverticulitis. Normal appendix. Lymphatic: No enlarged abdominal or pelvic lymph nodes. Reproductive: No adnexal masses. Multifocal coarse calcifications within the uterus, likely leiomyomata. Other: Small volume free fluid.  No free air or fluid collection. Musculoskeletal: No acute or abnormal lytic or blastic osseous lesions. Multilevel degenerative changes of the lumbar spine. Postsurgical changes of L4-5 superior spinous process and L2-3 fusion. Unchanged fat containing left lumbar hernias. Mild body wall edema. Review of the MIP images confirms the above findings. IMPRESSION: 1. No evidence of aortic dissection. Similar postsurgical changes of the ascending aorta. 2. Similar multichamber cardiomegaly with asymmetric right pulmonary edema and unchanged moderate right and trace left pleural effusions. 3. Patchy right lower lobe ground-glass opacities, which may be superimposed infection or inflammation. 4. Unchanged mildly enlarged right hilar and subcarinal lymph nodes, likely reactive. 5. Increased size of a 2.6 cm exophytic right upper pole renal hypodensity. Recommend further nonemergent characterization. While renal protocol MRI or CT are preferred, in the setting of renal failure, renal ultrasound may be pursued first. 6. Aortic Atherosclerosis (ICD10-I70.0). Coronary artery calcifications. Assessment for potential risk factor modification, dietary therapy or pharmacologic therapy may be warranted, if clinically indicated. Electronically Signed   By: Agustin Cree M.D.   On: 08/21/2023 10:42   DG Chest Port 1 View Result Date: 08/21/2023 CLINICAL DATA:  Cough EXAM: PORTABLE CHEST 1 VIEW COMPARISON:  Chest radiograph dated 08/09/2023 FINDINGS: Lines/tubes: Right internal jugular venous catheter tip projects over the right atrium. Surgical clips project over the right axilla. Lungs: Patient is rotated slightly to the left. Well inflated lungs.  Similar interstitial opacities and patchy bibasilar opacities, right-greater-than-left. Pleura: No pneumothorax or pleural effusion. Heart/mediastinum: Similar markedly enlarged cardiomediastinal silhouette. Bones: Median sternotomy wires are nondisplaced. IMPRESSION: 1. Similar interstitial opacities and patchy bibasilar opacities, right-greater-than-left, which may represent pulmonary edema or infection. 2. Similar marked cardiomegaly. Electronically Signed   By: Agustin Cree M.D.   On: 08/21/2023 08:40      Data Reviewed: Relevant notes from primary care and specialist visits, past discharge summaries as available in EHR, including Care Everywhere. Prior diagnostic testing as pertinent to current admission diagnoses, Updated medications and problem lists for reconciliation ED course, including vitals, labs, imaging, treatment and response to treatment,Triage notes, nursing and pharmacy notes and ED provider's notes Notable results as noted in HPI.Discussed case with EDMD/ ED APP/ or Specialty MD on call and as needed.  Assessment & Plan    PAF: -currently in sinus rhythm cont renally dosed eliquis.  -CHA2DS2/VAS Stroke Risk Points  Current as of a minute ago    6 >= 2 Points: High Risk .   C/H systolic HFrEF at 45-50%: -Clinically pt has pleural effusion and is volume overloaded -cont HD for volume management.   ESRD on HD: -Nephrology consulted.  - Pt on tue/thurs/saturday.   Hypertensive urgency without CHF: -cont ntg drip.  -admit to progressive.   Hyperkalemia: -due to esrd, recheck pending.   --Lokelma for hyperkalemia.  -Hyperkalemia ed order set utilized.    Pneumonia: -will start pt on rocephin.  -aspiration precaution, fall precaution.  DVT prophylaxis:  Eliquis  Consults:  Nephrology  Advance Care Planning:    Code Status: Full Code   Family Communication:  None  Disposition Plan:  Home  Severity of Illness: The appropriate patient status for this patient  is OBSERVATION. Observation status is judged to be reasonable and necessary in order to provide the required intensity of service to ensure the patient's safety. The patient's presenting symptoms, physical exam findings, and initial radiographic and laboratory data in the context of their medical condition is felt to place them at decreased risk for further clinical deterioration. Furthermore, it is anticipated that the patient will be medically stable for discharge from the hospital within 2 midnights of admission.   Author: Gertha Calkin, MD 08/21/2023 12:55 PM  For on call review [email or web address].   Unresulted Labs (From admission, onward)     Start     Ordered   08/22/23 0500  Comprehensive metabolic panel  Tomorrow morning,   R        08/21/23 1238   08/22/23 0500  CBC  Tomorrow morning,   R        08/21/23 1238            Orders Placed This Encounter  Procedures   DG Chest Port 1 View   CT Angio Chest/Abd/Pel for Dissection W and/or Wo Contrast   Comprehensive metabolic panel   CBC with Differential   Brain natriuretic peptide   Comprehensive metabolic panel   CBC   Diet clear liquid Room service appropriate? Yes; Fluid consistency: Thin   Maintain IV access   Vital signs   Notify physician (specify)   Mobility Protocol: No Restrictions RN to initiate protocols based on patient's level of care   Refer to Sidebar Report Refer to ICU, Med-Surg, Progressive, and Step-Down Mobility Protocol Sidebars   Initiate Adult Central Line Maintenance and Catheter Protocol for patients with central line (CVC, PICC, Port, Hemodialysis, Trialysis)   Daily weights   Intake and Output   Do not place and if present remove PureWick   Initiate Oral Care Protocol   Initiate Carrier Fluid Protocol   RN may order General Admission PRN Orders utilizing "General Admission PRN medications" (through manage orders) for the following patient needs: allergy symptoms (Claritin), cold sores (Carmex), cough  (Robitussin DM), eye irritation (Liquifilm Tears), hemorrhoids (Tucks), indigestion (Maalox), minor skin irritation (Hydrocortisone Cream), muscle pain Romeo Apple Gay), nose irritation (saline nasal spray) and sore throat (Chloraseptic spray).   Cardiac Monitoring - Continuous Indefinite   Full code   Consult to nephrology   Consult to hospitalist   Pulse oximetry check with vital signs   Oxygen therapy Mode or (Route): Nasal cannula; Liters Per Minute: 2; Keep O2 saturation between: greater than 92 %   EKG 12-Lead   Place in observation (patient's expected length of stay will be less than 2 midnights)   Aspiration precautions   Fall precautions

## 2023-08-21 NOTE — ED Triage Notes (Signed)
 Pt arrives via EMS from home with SOB since last night. RA sat 78% and placed on 4L. Endorses nonproductive cough for last few days with chills. Last HD Thursday, due today.

## 2023-08-21 NOTE — Progress Notes (Signed)
 [facility] night cross cover note:   I was notified by RN that the patient has just returned from hemodialysis, with blood pressure at this time noted to be 216/117, with heart rate 105, in atrial fibrillation.  RN conveys that the patient was previously on nitroglycerin drip, but that this has been stopped.  The patient has existing orders for resumption of her home Coreg, p.o. clonidine, and hydralazine, with next doses to occur this evening, but does not currently have an order for prn anti-hypertensive.  I subsequently added prn IV labetalol for systolic blood pressure greater than 180 mmHg.    Newton Pigg, DO Hospitalist

## 2023-08-21 NOTE — Progress Notes (Signed)
 Received patient in bed to unit.  Alert and oriented. x4 Informed consent signed and in chart.   TX duration:3 hours  Patient tolerated well.  Transported back to the room  Alert, without acute distress.  Hand-off given to patient's nurse.   Access used: hd cath Access issues: low pressure alarm   Total UF removed: 3000 Medication(s) given: heplock 1.9 units per port Post HD VS: see table below Post HD weight: 35.3kg   08/21/23 2116  Vitals  Temp 98 F (36.7 C)  Temp Source Oral  BP (!) 180/155  MAP (mmHg) 164  BP Location Right Arm  BP Method Automatic  Patient Position (if appropriate) Lying  Pulse Rate 93  Pulse Rate Source Monitor  ECG Heart Rate (!) 107  Resp 19  Oxygen Therapy  SpO2 100 %  O2 Device Nasal Cannula  O2 Flow Rate (L/min) 3 L/min  Patient Activity (if Appropriate) In bed  Pulse Oximetry Type Continuous  During Treatment Monitoring  Blood Flow Rate (mL/min) 0 mL/min  Arterial Pressure (mmHg) 21.82 mmHg  Venous Pressure (mmHg) -499.98 mmHg  TMP (mmHg) 13.53 mmHg  Ultrafiltration Rate (mL/min) 0 mL/min  Dialysate Flow Rate (mL/min) 300 ml/min  Dialysate Potassium Concentration 2  Dialysate Calcium Concentration 2.5  Duration of HD Treatment -hour(s) 3 hour(s)  Cumulative Fluid Removed (mL) per Treatment  3016.85  HD Safety Checks Performed Yes  Intra-Hemodialysis Comments Tolerated well  Post Treatment  Dialyzer Clearance Lightly streaked  Hemodialysis Intake (mL) 0 mL  Liters Processed 66.9  Fluid Removed (mL) 3000 mL  Tolerated HD Treatment Yes  Post-Hemodialysis Comments goal met  Hemodialysis Catheter Right Subclavian Double lumen Permanent (Tunneled)  Placement Date/Time: 05/28/23 0942   Placed prior to admission: No  Serial / Lot #: [id number]  Expiration Date: 04/01/27  Time Out: Correct patient;Correct site;Correct procedure  Maximum sterile barrier precautions: Sterile gloves;Sterile gown;Mask;C...  Site Condition No complications   Blue Lumen Status Flushed;Heparin locked;Dead end cap in place  Red Lumen Status Flushed;Heparin locked;Dead end cap in place  Purple Lumen Status N/A  Catheter fill solution Heparin 1000 units/ml  Catheter fill volume (Arterial) 1.9 cc  Catheter fill volume (Venous) 1.9  Post treatment catheter status Capped and Clamped      [facility]

## 2023-08-21 NOTE — Hospital Course (Signed)
 Marland Kitchen

## 2023-08-21 NOTE — ED Notes (Signed)
 Patient transported to CT

## 2023-08-21 NOTE — Consult Note (Signed)
 Renal Service Consult Note [facility]  Monique Henderson 08/21/2023 [name], MD Requesting Physician: Dr. Allena Katz, E.   Reason for Consult: ESRD pt w/ hyperkalemia, SOB HPI: The patient is a 73 y.o. year-old w/ PMH as below who presented to ED earlier this morning complaining of shortness of breath, with onset last night.  EMS found her to have O2 sat of 78% and she was placed on 4 L nasal cannula oxygen.  Endorsed a nonproductive cough for last few days with chills.  Last dialysis Thursday, has not missed dialysis.  In emergency room showed similar . Similar interstitial opacities and patchy bibasilar opacities, right-greater-than-left, representing pulmonary edema or infection.and patchy bibasilar opacities right greater than left, pulmonary edema and/or infection.  Potassium 6.0, BUN 36, creatinine 8.5.  BNP greater than 4500.  Hemoglobin 12.6, WBC 6.0.  Patient was admitted and sent for CT angio of the chest.  We are asked to see for dialysis.   Pt seen in the ED room.  She is TTS dialysis at [facility].  [place] in [place] has her own apartment lives with a boyfriend and takes the [employer] bus to dialysis.  He does not have any problem walking.  She is not on home O2.  History as above.  PMH Status post cardioembolic stroke Paroxysmal atrial fibrillation History of aortic dissection status post repair History of breast cancer 2004 Chronic heart failure with reduced EF COPD ESRD on HD HTN History of hepatitis C Tobacco use disorder   ROS - denies CP, no joint pain, no HA, no blurry vision, no rash, no diarrhea, no nausea/ vomiting   Past Medical History  Past Medical History:  Diagnosis Date   Acute cardioembolic stroke (HCC) 02/26/2022   AF (paroxysmal atrial fibrillation) (HCC) 05/29/2019   on Coumadin   Aortic atherosclerosis (HCC) 07/05/2019   Aortic dissection (HCC) 04/04/2019   s/p repair   Bone spur 2008   Right calcaneal foot spur    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 Chevis Pretty; S/P re-excision of superior and inferior margins 10/27/2012.    Dissection of aorta (HCC) 04/03/2019   ESRD on hemodialysis (HCC) 05/29/2019   Essential hypertension 09/16/2006   GERD 09/16/2006   Hepatitis C    treated and RNA confirmed not detectable 01/2017   Insomnia 03/14/2015   Malnutrition of moderate degree 05/19/2019   Non compliance with medical treatment 12/04/2017   Normocytic anemia    With thrombocytosis   Osteoarthritis    Right ureteral stone 2002   S/P lumbar spinal fusion 01/18/2014   S/P lumbar decompressive laminectomy, fusion, and plating for lumbar spinal stenosis on 05/27/2009 by Dr. Tia Alert.  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 Tia Alert.     Stroke (cerebrum) [facility])    Stroke (HCC) 02/23/2022   SVT (supraventricular tachycardia) (HCC)  08/28/2019   Tobacco use disorder 04/19/2009   Uterine fibroid    Wears dentures    top   Past Surgical History  Past Surgical History:  Procedure Laterality Date   ANTERIOR LAT LUMBAR FUSION N/A 01/18/2014   Procedure: ANTERIOR LATERAL LUMBAR FUSION LUMBAR TWO-THREE;  Surgeon: Tia Alert, MD;  Location: [facility];  Service: Neurosurgery;  Laterality: N/A;   ANTERIOR LUMBAR FUSION   01/18/2014   AV FISTULA PLACEMENT Left 04/20/2019   Procedure: ARTERIOVENOUS (AV) FISTULA CREATION;  Surgeon: Maeola Harman, MD;  Location: [facility] OR;  Service: Vascular;  Laterality: Left;   BACK SURGERY     BREAST LUMPECTOMY Left 01/2003   BREAST LUMPECTOMY Right 2014   BREAST LUMPECTOMY WITH NEEDLE LOCALIZATION AND AXILLARY SENTINEL LYMPH NODE BX Right 10/13/2012   Procedure: BREAST LUMPECTOMY WITH NEEDLE LOCALIZATION;  Surgeon: Robyne Askew, MD;  Location: [facility];  Service: General;  Laterality: Right;  Right breast wire localized lumpectomy   DIALYSIS/PERMA CATHETER INSERTION N/A 05/28/2023   Procedure: DIALYSIS/PERMA CATHETER INSERTION;  Surgeon: Ethelene Hal, MD;  Location: [facility];  Service: Cardiovascular;  Laterality: N/A;   INSERTION OF DIALYSIS CATHETER Right 04/20/2019   Procedure: INSERTION OF DIALYSIS CATHETER, right internal jugular;  Surgeon: Maeola Harman, MD;  Location: [facility] OR;  Service: Vascular;  Laterality: Right;   INSERTION OF DIALYSIS CATHETER Right 09/24/2020   Procedure: INSERTION OF TUNNELED DIALYSIS CATHETER;  Surgeon: Larina Earthly, MD;  Location: [facility] OR;  Service: Vascular;  Laterality: Right;   IR FLUORO GUIDE CV LINE RIGHT  09/22/2020   IR THORACENTESIS ASP PLEURAL SPACE W/IMG GUIDE  05/19/2019   IR US GUIDE VASC ACCESS LEFT  09/22/2020   IR US GUIDE VASC ACCESS RIGHT  09/22/2020   IR VENOCAVAGRAM SVC  09/22/2020   LAMINECTOMY  05/27/2009   Lumbar decompressive laminectomy, fusion and plating for lumbar spinal stensosis   LIGATION OF ARTERIOVENOUS  FISTULA Left 09/24/2020   Procedure: LIGATION OF LEFT ARM ARTERIOVENOUS  FISTULA;  Surgeon: Larina Earthly, MD;  Location[facility] OR;  Service: Vascular;  Laterality: Left;   LUMBAR LAMINECTOMY/DECOMPRESSION MICRODISCECTOMY Left 03/23/2013   Procedure: LUMBAR LAMINECTOMY/DECOMPRESSION MICRODISCECTOMY 1 LEVEL;  Surgeon: Tia Alert, MD;  Location: [facility];  Service:  Neurosurgery;  Laterality: Left;  LUMBAR LAMINECTOMY/DECOMPRESSION MICRODISCECTOMY 1 LEVEL   MASTECTOMY, PARTIAL Left 02/26/2003   ; S/P re-excision of cranial and lateral margins 04/19/2003.    RE-EXCISION OF BREAST CANCER,SUPERIOR MARGINS Right 10/27/2012   Procedure: RE-EXCISION OF BREAST CANCER,SUPERIOR and inferior MARGINS;  Surgeon: Robyne Askew, MD;  Location: [facility] OR;  Service: General;  Laterality: Right;   RE-EXCISION OF BREAST LUMPECTOMY Left 04/2003   TEE WITHOUT CARDIOVERSION N/A 04/04/2019   Procedure: Transesophageal Echocardiogram (Tee);  Surgeon: Linden Dolin, MD;  Location: [facility] OR;  Service: Open Heart Surgery;  Laterality: N/A;   THORACIC AORTIC ANEURYSM REPAIR N/A 04/04/2019   Procedure: THORACIC ASCENDING ANEURYSM REPAIR (AAA)  USING 28 MM X 30 CM HEMASHIELD PLATINUM VASCULAR GRAFT;  Surgeon: Linden Dolin, MD;  Location: [facility] OR;  Service: Open Heart Surgery;  Laterality: N/A;   Family History  Family History  Problem Relation Age of Onset   Colon cancer Mother 21   Hypertension Mother    Diabetes Sister 40   Hypertension Sister    Diabetes Brother    Hypertension Brother    Diabetes Brother    Hypertension Brother  Kidney disease Son        s/p renal transplant   Hypertension Son    Diabetes Son    Multiple sclerosis Son    Bone cancer Sister 80   Breast cancer Neg Hx    Cervical cancer Neg Hx    Social History  reports that she quit smoking about 3 years ago. Her smoking use included cigarettes. She started smoking about 47 years ago. She has a 11 pack-year smoking history. She has never used smokeless tobacco. She reports that she does not currently use alcohol after a past usage of about 2.0 standard drinks of alcohol per week. She reports that she does not currently use drugs after having used the following drugs: Cocaine. Allergies  Allergies  Allergen Reactions   Shrimp [Shellfish Allergy] Shortness Of Breath   Bactroban [Mupirocin] Other (See  Comments)    "Sores in nose"   Chlorhexidine Gluconate Itching   Hydrocodone Itching, Nausea And Vomiting and Nausea Only   Latex Itching    Latex gloves cause itchiness and a rash   Tylenol [Acetaminophen] Itching    Takes Percocet at home 05/06/23   Zestril [Lisinopril] Cough   Home medications Prior to Admission medications   Medication Sig Start Date End Date Taking? Authorizing Provider  albuterol (PROVENTIL) (2.5 MG/3ML) 0.083% nebulizer solution Take 2.5 mg by nebulization every 6 (six) hours as needed for wheezing or shortness of breath.    [provider]  albuterol (VENTOLIN HFA) 108 (90 Base) MCG/ACT inhaler Inhale 2 puffs into the lungs every 6 (six) hours as needed for wheezing or shortness of breath. 07/30/20   Janeece Agee, NP  amLODipine (NORVASC) 10 MG tablet Take 1 tablet (10 mg total) by mouth daily. 03/06/22   Setzer, Lynnell Jude, PA-C  apixaban (ELIQUIS) 2.5 MG TABS tablet Take 1 tablet (2.5 mg total) by mouth 2 (two) times daily. 03/06/22   Setzer, Lynnell Jude, PA-C  carvedilol (COREG) 25 MG tablet Take 1 tablet (25 mg total) by mouth 2 (two) times daily with a meal. 05/01/22 06/16/24  Ghimire, Werner Lean, MD  Cholecalciferol (VITAMIN D-3 PO) Take 1 capsule by mouth daily.    [provider]  cloNIDine (CATAPRES) 0.1 MG tablet Take 1 tablet (0.1 mg total) by mouth 3 (three) times daily. 06/19/23   Hughie Closs, MD  hydrALAZINE (APRESOLINE) 100 MG tablet Take 1 tablet (100 mg total) by mouth every 8 (eight) hours. 12/18/22   Corky Crafts, MD  isosorbide mononitrate (IMDUR) 30 MG 24 hr tablet TAKE 1 TABLET (30MG ) ON DIALYSIS DAYS AND 2 TABLETS (60 MG) ON NON DIALYSIS DAYS. 01/12/23   Corky Crafts, MD  LOKELMA 10 g PACK packet Take by mouth. 07/30/23   [provider]  Methoxy PEG-Epoetin Beta (MIRCERA IJ) every 14 (fourteen) days. 07/06/23 07/04/24  [provider]  naloxone [facility]) nasal spray 4 mg/0.1 mL Place 1 spray into the nose as  needed (opioid overdose). 02/19/22   [provider]  oxyCODONE-acetaminophen (PERCOCET) 10-325 MG tablet Take 1 tablet by mouth in the morning and at bedtime. 03/12/22   [provider]  pantoprazole (PROTONIX) 40 MG tablet TAKE 1 TABLET (40 MG TOTAL) BY MOUTH 2 (TWO) TIMES DAILY. 05/13/23   Georganna Skeans, MD  rosuvastatin (CRESTOR) 10 MG tablet TAKE 1 TABLET (10 MG TOTAL) BY MOUTH DAILY FOR CHOLESTEROL 08/07/22   Georganna Skeans, MD  senna-docusate (SENOKOT-S) 8.6-50 MG tablet Take 1 tablet by mouth 2 (two) times daily between  meals as needed for mild constipation. 08/14/22   Almon Hercules, MD  sevelamer carbonate (RENVELA) 2.4 g PACK Take 2.4 g by mouth 3 (three) times daily with meals. 03/06/22   Setzer, Lynnell Jude, PA-C  traZODone (DESYREL) 50 MG tablet Take 25-50 mg by mouth at bedtime as needed for sleep. 03/19/23   [provider]     Vitals:   08/21/23 0945 08/21/23 0950 08/21/23 1007 08/21/23 1030  BP: (!) 230/147 (!) 244/134 (!) 223/137 (!) 238/139  Pulse: 100 98 94 95  Resp: (!) 32 (!) 35 (!) 41 (!) 32  Temp:      TempSrc:      SpO2: 94% 95% 91% 92%  Weight:      Height:       Exam Gen alert, no distress No rash, cyanosis or gangrene Sclera anicteric, throat clear  No jvd or bruits Chest occasional crackles at the bases, no increased work of breathing RRR no MRG Abd soft ntnd no mass or ascites +bs GU deferred MS no joint effusions or deformity Ext no LE or UE edema, no other edema Neuro is alert, Ox 3 , nf          Renal-related home meds: Norvasc 10 mg daily  Coreg 25 mg twice daily Clonidine 0.1 mg 3 times daily Hydralazine 100 mg twice daily Lokelma as needed Renvela 2.4 g AC 3 times daily    OP HD: [facility] TTS 3.5h  400 BFR 2K bath TDC -Dry weight pending   Assessment/ Plan: Hypertensive urgency: Get blood pressure down with medication and dialysis Shortness of breath: mild pulmonary edema on x-ray, get vol down w/  hd ESRD: on HD TTS.  Hemodialysis today/ tonight as above.  Hyperkalemia: K 6.0, received some temporizing measures.  Low potassium bath w/ hd. 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 Secondary hyperparathyroidism: Calcium in range, add on Phos. COPD/ tobacco use disorder      Vinson Moselle  MD [facility] 08/21/2023, 10:40 AM  Recent Labs  Lab 08/21/23 0823  HGB 12.6  ALBUMIN 4.1  CALCIUM 9.2  CREATININE 8.50*  K 6.0*   Inpatient medications:   nitroGLYCERIN 20 mcg/min (08/21/23 1030)

## 2023-08-21 NOTE — ED Provider Notes (Signed)
 [place] EMERGENCY DEPARTMENT AT [facility] Provider Note  CSN: [id number] Arrival date & time: 08/21/23 7253  Chief Complaint(s) Shortness of Breath  HPI Monique Henderson is a 73 y.o. female history of prior aortic dissection, hypertension, end-stage renal disease, on dialysis, prior stroke presenting to the emergency department shortness of breath.  Patient reports shortness of breath, chest pain, back pain starting last night.  She reports that she has been compliant with her dialysis and medications at home.  She reports some associated nonproductive cough.  No abdominal pain.  No fevers or chills.  No syncope.  No headaches.  She reports that the chest and back pain started suddenly.   Past Medical History Past Medical History:  Diagnosis Date   Acute cardioembolic stroke (HCC) 02/26/2022   AF (paroxysmal atrial fibrillation) (HCC) 05/29/2019   on Coumadin   Aortic atherosclerosis (HCC) 07/05/2019   Aortic dissection (HCC) 04/04/2019   s/p repair   Bone spur 2008   Right calcaneal foot spur   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 Chevis Pretty; S/P re-excision of superior and inferior margins 10/27/2012.    Dissection of aorta (HCC) 04/03/2019   ESRD on hemodialysis (HCC) 05/29/2019   Essential hypertension 09/16/2006   GERD 09/16/2006   Hepatitis C    treated and RNA confirmed not detectable 01/2017   Insomnia 03/14/2015   Malnutrition of moderate degree 05/19/2019   Non compliance  with medical treatment 12/04/2017   Normocytic anemia    With thrombocytosis   Osteoarthritis    Right ureteral stone 2002   S/P lumbar spinal fusion 01/18/2014   S/P lumbar decompressive laminectomy, fusion, and plating for lumbar spinal stenosis on 05/27/2009 by Dr. Tia Alert.  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 Tia Alert.     Stroke (cerebrum) (HCC)    Stroke (HCC) 02/23/2022   SVT (supraventricular tachycardia) (HCC) 08/28/2019   Tobacco use disorder 04/19/2009   Uterine fibroid    Wears dentures    top   Patient Active Problem List   Diagnosis Date Noted   SOB (shortness of breath) 08/21/2023   Hypertensive crisis 08/10/2023   Cellulitis 07/25/2023   Complication of dialysis access insertion 07/22/2023   History of COPD 07/22/2023   Atrial fibrillation with RVR (HCC) 06/26/2023   Acute on chronic respiratory failure with hypoxia (HCC) 06/17/2023   Paroxysmal atrial flutter (HCC) 06/05/2023   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   Elevated troponin level not due to acute coronary syndrome 06/05/2023   History of aortic dissection 06/05/2023   History of cocaine abuse (HCC) 06/05/2023   Noncompliance with medication regimen 06/05/2023   End stage renal disease (HCC) 06/05/2023   Chronic heart failure with mildly reduced ejection fraction (HFmrEF, 41-49%) (HCC) 06/05/2023   Long term (current) use of anticoagulants 06/05/2023   Acute hypoxic respiratory failure (HCC) 06/02/2023  Acute respiratory failure with hypoxia (HCC) 05/07/2023   Acute hypoxemic respiratory failure (HCC) 05/06/2023   Advance care planning 01/15/2023   Hypercoagulable state due to paroxysmal atrial fibrillation (HCC) 10/21/2022   Right shoulder pain 06/17/2022   Edema of right upper arm  06/17/2022   Hemiparesis affecting right side as late effect of cerebrovascular accident (CVA) (HCC) 04/06/2022   Hypervolemia associated with renal insufficiency 01/29/2022   HLD (hyperlipidemia) 01/29/2022   Polysubstance abuse (HCC) 01/06/2022   Acute on chronic combined systolic and diastolic CHF (congestive heart failure) (HCC) 01/06/2022   History of hepatitis C 12/08/2021   Hypertensive urgency 10/29/2021   Fluid overload 10/29/2021   Acute on chronic HFrEF (heart failure with reduced ejection fraction) (HCC) - BNP does NOT correlate to her level of volume overload 10/29/2021   Chronic anticoagulation 10/29/2021   Prolonged QT interval 10/29/2021   Hypertensive emergency 08/23/2021   History of anemia due to chronic kidney disease 06/05/2021   Chronic combined systolic and diastolic heart failure (HCC) 04/30/2021   Pure hypercholesterolemia 02/14/2021   Metabolic acidosis with increased anion gap and accumulation of organic acids 09/21/2020   Hypertension 09/21/2020   Hyperkalemia 08/28/2019   Paroxysmal SVT (supraventricular tachycardia) (HCC) 08/27/2019   Aortic atherosclerosis (HCC) 07/05/2019   History of CVA (cerebrovascular accident) 05/29/2019   Paroxysmal atrial fibrillation (HCC) 05/29/2019   ESRD (end stage renal disease) (HCC) 05/29/2019   Malnutrition of moderate degree 05/19/2019   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 04/07/2019   Non compliance with medical treatment 12/04/2017   Chronic obstructive lung disease (HCC) 01/16/2017   Chronic low back pain 06/22/2016   Insomnia 03/14/2015   S/P lumbar spinal fusion 01/18/2014   Tobacco use disorder 04/19/2009   Essential hypertension 09/16/2006   GERD 09/16/2006   Home Medication(s) Prior to Admission medications   Medication Sig Start Date End Date Taking? Authorizing Provider  albuterol (PROVENTIL) (2.5 MG/3ML) 0.083% nebulizer solution Take 2.5 mg by nebulization every 6 (six) hours as needed for wheezing or  shortness of breath.    [provider]  albuterol (VENTOLIN HFA) 108 (90 Base) MCG/ACT inhaler Inhale 2 puffs into the lungs every 6 (six) hours as needed for wheezing or shortness of breath. 07/30/20   Janeece Agee, NP  amLODipine (NORVASC) 10 MG tablet Take 1 tablet (10 mg total) by mouth daily. 03/06/22   Setzer, Lynnell Jude, PA-C  apixaban (ELIQUIS) 2.5 MG TABS tablet Take 1 tablet (2.5 mg total) by mouth 2 (two) times daily. 03/06/22   Setzer, Lynnell Jude, PA-C  carvedilol (COREG) 25 MG tablet Take 1 tablet (25 mg total) by mouth 2 (two) times daily with a meal. 05/01/22 06/16/24  Ghimire, Werner Lean, MD  Cholecalciferol (VITAMIN D-3 PO) Take 1 capsule by mouth daily.    [provider]  cloNIDine (CATAPRES) 0.1 MG tablet Take 1 tablet (0.1 mg total) by mouth 3 (three) times daily. 06/19/23   Hughie Closs, MD  hydrALAZINE (APRESOLINE) 100 MG tablet Take 1 tablet (100 mg total) by mouth every 8 (eight) hours. 12/18/22   Corky Crafts, MD  isosorbide mononitrate (IMDUR) 30 MG 24 hr tablet TAKE 1 TABLET (30MG ) ON DIALYSIS DAYS AND 2 TABLETS (60 MG) ON NON DIALYSIS DAYS. 01/12/23   Corky Crafts, MD  LOKELMA 10 g PACK packet Take by mouth. 07/30/23   [provider]  Methoxy PEG-Epoetin Beta (MIRCERA IJ) every 14 (fourteen) days. 07/06/23 07/04/24  [provider]  naloxone Jonelle Sports) nasal spray  4 mg/0.1 mL Place 1 spray into the nose as needed (opioid overdose). 02/19/22   [provider]  oxyCODONE-acetaminophen (PERCOCET) 10-325 MG tablet Take 1 tablet by mouth in the morning and at bedtime. 03/12/22   [provider]  pantoprazole (PROTONIX) 40 MG tablet TAKE 1 TABLET (40 MG TOTAL) BY MOUTH 2 (TWO) TIMES DAILY. 05/13/23   Georganna Skeans, MD  rosuvastatin (CRESTOR) 10 MG tablet TAKE 1 TABLET (10 MG TOTAL) BY MOUTH DAILY FOR CHOLESTEROL 08/07/22   Georganna Skeans, MD  senna-docusate (SENOKOT-S) 8.6-50 MG tablet Take 1 tablet by mouth 2 (two) times  daily between meals as needed for mild constipation. 08/14/22   Almon Hercules, MD  sevelamer carbonate (RENVELA) 2.4 g PACK Take 2.4 g by mouth 3 (three) times daily with meals. 03/06/22   Setzer, Lynnell Jude, PA-C  traZODone (DESYREL) 50 MG tablet Take 25-50 mg by mouth at bedtime as needed for sleep. 03/19/23   [provider]                                                                                                                                    Past Surgical History Past Surgical History:  Procedure Laterality Date   ANTERIOR LAT LUMBAR FUSION N/A 01/18/2014   Procedure: ANTERIOR LATERAL LUMBAR FUSION LUMBAR TWO-THREE;  Surgeon: Tia Alert, MD;  Location: [facility];  Service: Neurosurgery;  Laterality: N/A;   ANTERIOR LUMBAR FUSION  01/18/2014   AV FISTULA PLACEMENT Left 04/20/2019   Procedure: ARTERIOVENOUS (AV) FISTULA CREATION;  Surgeon: Maeola Harman, MD;  Location: [facility] OR;  Service: Vascular;  Laterality: Left;   BACK SURGERY     BREAST LUMPECTOMY Left 01/2003   BREAST LUMPECTOMY Right 2014   BREAST LUMPECTOMY WITH NEEDLE LOCALIZATION AND AXILLARY SENTINEL LYMPH NODE BX Right 10/13/2012   Procedure: BREAST LUMPECTOMY WITH NEEDLE LOCALIZATION;  Surgeon: Robyne Askew, MD;  Location: [facility];  Service: General;  Laterality: Right;  Right breast wire localized lumpectomy   DIALYSIS/PERMA CATHETER INSERTION N/A 05/28/2023   Procedure: DIALYSIS/PERMA CATHETER INSERTION;  Surgeon: Ethelene Hal, MD;  Location: [facility];  Service: Cardiovascular;  Laterality: N/A;   INSERTION OF DIALYSIS CATHETER Right 04/20/2019   Procedure: INSERTION OF DIALYSIS CATHETER, right internal jugular;  Surgeon: Maeola Harman, MD;  Location: [facility] OR;  Service: Vascular;  Laterality: Right;   INSERTION OF DIALYSIS CATHETER Right 09/24/2020   Procedure: INSERTION OF TUNNELED DIALYSIS CATHETER;  Surgeon: Larina Earthly, MD;  Location: [facility] OR;  Service:  Vascular;  Laterality: Right;   IR FLUORO GUIDE CV LINE RIGHT  09/22/2020   IR THORACENTESIS ASP PLEURAL SPACE W/IMG GUIDE  05/19/2019   IR US GUIDE VASC ACCESS LEFT  09/22/2020   IR US GUIDE VASC ACCESS RIGHT  09/22/2020   IR VENOCAVAGRAM SVC  09/22/2020   LAMINECTOMY  05/27/2009   Lumbar decompressive  laminectomy, fusion and plating for lumbar spinal stensosis   LIGATION OF ARTERIOVENOUS  FISTULA Left 09/24/2020   Procedure: LIGATION OF LEFT ARM ARTERIOVENOUS  FISTULA;  Surgeon: Larina Earthly, MD;  Location: [facility] OR;  Service: Vascular;  Laterality: Left;   LUMBAR LAMINECTOMY/DECOMPRESSION MICRODISCECTOMY Left 03/23/2013   Procedure: LUMBAR LAMINECTOMY/DECOMPRESSION MICRODISCECTOMY 1 LEVEL;  Surgeon: Tia Alert, MD;  Location: [facility];  Service: Neurosurgery;  Laterality: Left;  LUMBAR LAMINECTOMY/DECOMPRESSION MICRODISCECTOMY 1 LEVEL   MASTECTOMY, PARTIAL Left 02/26/2003   ; S/P re-excision of cranial and lateral margins 04/19/2003.    RE-EXCISION OF BREAST CANCER,SUPERIOR MARGINS Right 10/27/2012   Procedure: RE-EXCISION OF BREAST CANCER,SUPERIOR and inferior MARGINS;  Surgeon: Robyne Askew, MD;  Location: [facility] OR;  Service: General;  Laterality: Right;   RE-EXCISION OF BREAST LUMPECTOMY Left 04/2003   TEE WITHOUT CARDIOVERSION N/A 04/04/2019   Procedure: Transesophageal Echocardiogram (Tee);  Surgeon: Linden Dolin, MD;  Location: [facility] OR;  Service: Open Heart Surgery;  Laterality: N/A;   THORACIC AORTIC ANEURYSM REPAIR N/A 04/04/2019   Procedure: THORACIC ASCENDING ANEURYSM REPAIR (AAA)  USING 28 MM X 30 CM HEMASHIELD PLATINUM VASCULAR GRAFT;  Surgeon: Linden Dolin, MD;  Location: [facility] OR;  Service: Open Heart Surgery;  Laterality: N/A;   Family History Family History  Problem Relation Age of Onset   Colon cancer Mother 32   Hypertension Mother    Diabetes Sister 46   Hypertension Sister    Diabetes Brother    Hypertension Brother    Diabetes Brother    Hypertension Brother     Kidney disease Son        s/p renal transplant   Hypertension Son    Diabetes Son    Multiple sclerosis Son    Bone cancer Sister 91   Breast cancer Neg Hx    Cervical cancer Neg Hx     Social History Social History   Tobacco Use   Smoking status: Former    Current packs/day: 0.00    Average packs/day: 0.3 packs/day for 44.0 years (11.0 ttl pk-yrs)    Types: Cigarettes    Start date: 38    Quit date: 2022    Years since quitting: 3.2   Smokeless tobacco: Never   Tobacco comments:    Former smoker 04/21/23  Vaping Use   Vaping status: Never Used  Substance Use Topics   Alcohol use: Not Currently    Alcohol/week: 2.0 standard drinks of alcohol    Types: 2 Cans of beer per week    Comment: "couple beers a weekend", not currently   Drug use: Not Currently    Types: Cocaine    Comment: last in 2005   Allergies Shrimp [shellfish allergy], Bactroban [mupirocin], Chlorhexidine gluconate, Hydrocodone, Latex, Tylenol [acetaminophen], and Zestril [lisinopril]  Review of Systems Review of Systems  All other systems reviewed and are negative.   Physical Exam Vital Signs  I have reviewed the triage vital signs BP (!) 215/134   Pulse 93   Temp 98.1 F (36.7 C) (Oral)   Resp (!) 25   Ht 5\' 3"  (1.6 m)   Wt 44.3 kg   SpO2 100%   BMI 17.30 kg/m  Physical Exam Vitals and nursing note reviewed.  Constitutional:      General: She is not in acute distress.    Appearance: She is well-developed.  HENT:     Head: Normocephalic and atraumatic.     Mouth/Throat:     Mouth:  Mucous membranes are moist.  Eyes:     Pupils: Pupils are equal, round, and reactive to light.  Neck:     Vascular: JVD present.  Cardiovascular:     Rate and Rhythm: Normal rate and regular rhythm.     Pulses:          Radial pulses are 2+ on the right side and 2+ on the left side.     Heart sounds: No murmur heard. Pulmonary:     Effort: Pulmonary effort is normal. Tachypnea present. No  respiratory distress.     Breath sounds: Examination of the right-lower field reveals rales. Examination of the left-lower field reveals rales. Rales present.  Abdominal:     General: Abdomen is flat.     Palpations: Abdomen is soft.     Tenderness: There is no abdominal tenderness.  Musculoskeletal:        General: No tenderness.     Right lower leg: Edema present.     Left lower leg: Edema present.  Skin:    General: Skin is warm and dry.  Neurological:     General: No focal deficit present.     Mental Status: She is alert. Mental status is at baseline.  Psychiatric:        Mood and Affect: Mood normal.        Behavior: Behavior normal.     ED Results and Treatments Labs (all labs ordered are listed, but only abnormal results are displayed) Labs Reviewed  COMPREHENSIVE METABOLIC PANEL - Abnormal; Notable for the following components:      Result Value   Potassium 6.0 (*)    Chloride 96 (*)    Glucose, Bld 112 (*)    BUN 36 (*)    Creatinine, Ser 8.50 (*)    GFR, Estimated 5 (*)    Anion gap 16 (*)    All other components within normal limits  CBC WITH DIFFERENTIAL/PLATELET - Abnormal; Notable for the following components:   RDW 19.7 (*)    Lymphs Abs 0.6 (*)    All other components within normal limits  BRAIN NATRIURETIC PEPTIDE - Abnormal; Notable for the following components:   B Natriuretic Peptide >4,500.0 (*)    All other components within normal limits  TROPONIN I (HIGH SENSITIVITY) - Abnormal; Notable for the following components:   Troponin I (High Sensitivity) 75 (*)    All other components within normal limits  TROPONIN I (HIGH SENSITIVITY) - Abnormal; Notable for the following components:   Troponin I (High Sensitivity) 70 (*)    All other components within normal limits                                                                                                                          Radiology CT Angio Chest/Abd/Pel for Dissection W and/or Wo  Contrast Result Date: 08/21/2023 CLINICAL DATA:  Several day history of cough, chills, and new shortness of breath with hypoxia EXAM: CT ANGIOGRAPHY  CHEST, ABDOMEN AND PELVIS TECHNIQUE: Non-contrast CT of the chest was initially obtained. Multidetector CT imaging through the chest, abdomen and pelvis was performed using the standard protocol during bolus administration of intravenous contrast. Multiplanar reconstructed images and MIPs were obtained and reviewed to evaluate the vascular anatomy. RADIATION DOSE REDUCTION: This exam was performed according to the departmental dose-optimization program which includes automated exposure control, adjustment of the mA and/or kV according to patient size and/or use of iterative reconstruction technique. CONTRAST:  75mL OMNIPAQUE IOHEXOL 350 MG/ML SOLN COMPARISON:  CTA chest, abdomen, and pelvis dated 01/14/2023 and multiple priors FINDINGS: CTA CHEST FINDINGS Cardiovascular: Right IJ central venous catheter terminates in the right atrium. Preferential opacification of the thoracic aorta. Similar postsurgical changes of the ascending aorta measuring 4.6 x 4.1 cm (7:65, remeasured). No evidence of thoracic aortic dissection. Similar multichamber cardiomegaly. No pericardial effusion. Coronary artery calcifications. 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 Reflux of contrast material into the hepatic veins, suggesting a degree of right heart dysfunction. Mediastinum/Nodes: Imaged thyroid gland without nodules meeting criteria for imaging follow-up by size. Normal esophagus. Unchanged 10 mm right hilar lymph node (7:58) and 17 mm subcarinal lymph node (7:62). Lungs/Pleura: The central airways are patent. Diffuse interlobular septal thickening, right-greater-than-left. Patchy right lower lobe ground-glass opacities. Asymmetric right bronchial wall  thickening. No pneumothorax. Unchanged moderate right and trace left pleural effusions. Musculoskeletal: No acute or abnormal lytic or blastic osseous lesions. Median sternotomy wires are nondisplaced. Multilevel degenerative changes of the thoracic spine. Flowing anterior osteophytes over at least 4 contiguous thoracic vertebrae which can be seen in the setting of diffuse idiopathic skeletal hyperostosis (DISH). Review of the MIP images confirms the above findings. CTA ABDOMEN AND PELVIS FINDINGS VASCULAR Severe aortic and branch atherosclerosis. Normal caliber of the abdominal aorta. No evidence of aortic aneurysm, dissection, or acute aortic abnormality. NON-VASCULAR Hepatobiliary: No focal hepatic lesions. No intra or extrahepatic biliary ductal dilation. Normal gallbladder. Pancreas: No focal lesions or main ductal dilation. Spleen: Normal in size without focal abnormality. Adrenals/Urinary Tract: No adrenal nodules. Atrophic kidneys without hydronephrosis. 2.6 cm exophytic right upper pole hypodensity (7:157), increased in size from 1.8 cm on 11/18/2021. Simple-appearing cyst in the lower pole left kidney and subcentimeter hyperdense cyst in the upper pole. No specific follow-up imaging recommended. No focal bladder wall thickening. Stomach/Bowel: Normal appearance of the stomach. No evidence of bowel wall thickening, distention, or inflammatory changes. Colonic diverticulosis without acute diverticulitis. Normal appendix. Lymphatic: No enlarged abdominal or pelvic lymph nodes. Reproductive: No adnexal masses. Multifocal coarse calcifications within the uterus, likely leiomyomata. Other: Small volume free fluid.  No free air or fluid collection. Musculoskeletal: No acute or abnormal lytic or blastic osseous lesions. Multilevel degenerative changes of the lumbar spine. Postsurgical changes of L4-5 superior spinous process and L2-3 fusion. Unchanged fat containing left lumbar hernias. Mild body wall edema.  Review of the MIP images confirms the above findings. IMPRESSION: 1. No evidence of aortic dissection. Similar postsurgical changes of the ascending aorta. 2. Similar multichamber cardiomegaly with asymmetric right pulmonary edema and unchanged moderate right and trace left pleural effusions. 3. Patchy right lower lobe ground-glass opacities, which may be superimposed infection or inflammation. 4. Unchanged mildly enlarged right hilar and subcarinal lymph nodes, likely reactive. 5. Increased size of a 2.6 cm exophytic right upper pole renal hypodensity. Recommend further nonemergent characterization. While renal protocol MRI or CT are preferred,  in the setting of renal failure, renal ultrasound may be pursued first. 6. Aortic Atherosclerosis (ICD10-I70.0). Coronary artery calcifications. Assessment for potential risk factor modification, dietary therapy or pharmacologic therapy may be warranted, if clinically indicated. Electronically Signed   By: Agustin Cree M.D.   On: 08/21/2023 10:42   DG Chest Port 1 View Result Date: 08/21/2023 CLINICAL DATA:  Cough EXAM: PORTABLE CHEST 1 VIEW COMPARISON:  Chest radiograph dated 08/09/2023 FINDINGS: Lines/tubes: Right internal jugular venous catheter tip projects over the right atrium. Surgical clips project over the right axilla. Lungs: Patient is rotated slightly to the left. Well inflated lungs. Similar interstitial opacities and patchy bibasilar opacities, right-greater-than-left. Pleura: No pneumothorax or pleural effusion. Heart/mediastinum: Similar markedly enlarged cardiomediastinal silhouette. Bones: Median sternotomy wires are nondisplaced. IMPRESSION: 1. Similar interstitial opacities and patchy bibasilar opacities, right-greater-than-left, which may represent pulmonary edema or infection. 2. Similar marked cardiomegaly. Electronically Signed   By: Agustin Cree M.D.   On: 08/21/2023 08:40    Pertinent labs & imaging results that were available during my care of the  patient were reviewed by me and considered in my medical decision making (see MDM for details).  Medications Ordered in ED Medications  nitroGLYCERIN 50 mg in dextrose 5 % 250 mL (0.2 mg/mL) infusion (25 mcg/min Intravenous Infusion Verify 08/21/23 1133)  sodium chloride flush (NS) 0.9 % injection 3 mL (has no administration in time range)  sodium chloride flush (NS) 0.9 % injection 3-10 mL (has no administration in time range)  sodium chloride flush (NS) 0.9 % injection 3-10 mL (has no administration in time range)  albuterol (PROVENTIL) (2.5 MG/3ML) 0.083% nebulizer solution 2.5 mg (has no administration in time range)  apixaban (ELIQUIS) tablet 2.5 mg (has no administration in time range)  cloNIDine (CATAPRES) tablet 0.1 mg (has no administration in time range)  hydrALAZINE (APRESOLINE) tablet 100 mg (has no administration in time range)  sodium zirconium cyclosilicate (LOKELMA) packet 10 g (has no administration in time range)  albuterol (VENTOLIN HFA) 108 (90 Base) MCG/ACT inhaler 2 puff (has no administration in time range)  rosuvastatin (CRESTOR) tablet 10 mg (has no administration in time range)  sevelamer carbonate (RENVELA) powder PACK 2.4 g (has no administration in time range)  carvedilol (COREG) tablet 25 mg (has no administration in time range)  HYDROmorphone (DILAUDID) injection 0.5 mg (0.5 mg Intravenous Given 08/21/23 1004)  iohexol (OMNIPAQUE) 350 MG/ML injection 75 mL (75 mLs Intravenous Contrast Given 08/21/23 0958)  amLODipine (NORVASC) tablet 10 mg (10 mg Oral Given 08/21/23 1100)  carvedilol (COREG) tablet 25 mg (25 mg Oral Given 08/21/23 1100)  hydrALAZINE (APRESOLINE) injection 10 mg (10 mg Intravenous Given 08/21/23 1100)                                                                                                                                     Procedures .Critical Care  Performed by: Lonell Grandchild, MD  Authorized by: Lonell Grandchild, MD   Critical  care provider statement:    Critical care time (minutes):  30   Critical care was necessary to treat or prevent imminent or life-threatening deterioration of the following conditions:  Respiratory failure, renal failure and circulatory failure   Critical care was time spent personally by me on the following activities:  Development of treatment plan with patient or surrogate, discussions with consultants, evaluation of patient's response to treatment, examination of patient, ordering and review of laboratory studies, ordering and review of radiographic studies, ordering and performing treatments and interventions, pulse oximetry, re-evaluation of patient's condition and review of old charts   Care discussed with: admitting provider     (including critical care time)  Medical Decision Making / ED Course   MDM:  73 year old presenting to the emergency department shortness of breath.  Patient chronically ill-appearing, appears mildly dyspneic, on 4 L of oxygen.  Denies oxygen use at home.  Was found to be 78% by paramedics.  Suspect likely cause is hypertensive emergency/acute CHF given significant hypertension.  Patient will likely need admission for dialysis.  Will check labs to evaluate for electrolyte derangement.  She does report some chest pain, EKG without evidence of STEMI, will check troponin.  Patient also reports back pain, given chest and back pain, history of aortic repair, will check CTA chest to evaluate for recurrent dissection or other process.  Considered pulmonary embolism but seems less likely, patient also on chronic Eliquis and reports that she is compliant.  Low concern for pneumothorax or pneumonia but will check x-ray.  Will need nephrology consultation as well.  Clinical Course as of 08/21/23 1311  Sat Aug 21, 2023  1308 CTA negative for acute dissection or other acute vascular process. Shows previously imaged renal mass radiology recommending further workup. Discussed  this finding with patient.  Symptoms seem most consistent with hypertensive emergency in the setting of end-stage renal disease.  Blood pressure mildly improved on nitro drip and patient given home medications.  She will need dialysis.  Discussed with nephrology who will arrange dialysis.  Discussed with hospitalist will admit the patient. [WS]    Clinical Course User Index [WS] Lonell Grandchild, MD     Additional history obtained: -Additional history obtained from ems -External records from outside source obtained and reviewed including: Chart review including previous notes, labs, imaging, consultation notes including prior notes    Lab Tests: -I ordered, reviewed, and interpreted labs.   The pertinent results include:   Labs Reviewed  COMPREHENSIVE METABOLIC PANEL - Abnormal; Notable for the following components:      Result Value   Potassium 6.0 (*)    Chloride 96 (*)    Glucose, Bld 112 (*)    BUN 36 (*)    Creatinine, Ser 8.50 (*)    GFR, Estimated 5 (*)    Anion gap 16 (*)    All other components within normal limits  CBC WITH DIFFERENTIAL/PLATELET - Abnormal; Notable for the following components:   RDW 19.7 (*)    Lymphs Abs 0.6 (*)    All other components within normal limits  BRAIN NATRIURETIC PEPTIDE - Abnormal; Notable for the following components:   B Natriuretic Peptide >4,500.0 (*)    All other components within normal limits  TROPONIN I (HIGH SENSITIVITY) - Abnormal; Notable for the following components:   Troponin I (High Sensitivity) 75 (*)    All other components within normal limits  TROPONIN I (HIGH SENSITIVITY) -  Abnormal; Notable for the following components:   Troponin I (High Sensitivity) 70 (*)    All other components within normal limits    Notable for signs of ESRD, hyperkalemia, elevated BNP, elevated troponin likely demand related   EKG   EKG Interpretation Date/Time:  Saturday August 21 2023 07:59:05 EDT Ventricular Rate:  91 PR  Interval:  158 QRS Duration:  139 QT Interval:  434 QTC Calculation: 534 R Axis:   60  Text Interpretation: Sinus rhythm Atrial premature complex Left atrial enlargement Right bundle branch block Nonspecific T abnormalities, lateral leads Confirmed by Alvino Blood [phone]) on 08/21/2023 8:35:34 AM         Imaging Studies ordered: I ordered imaging studies including CTA dissection protocol On my interpretation imaging demonstrates no dissection I independently visualized and interpreted imaging. I agree with the radiologist interpretation   Medicines ordered and prescription drug management: Meds ordered this encounter  Medications   nitroGLYCERIN 50 mg in dextrose 5 % 250 mL (0.2 mg/mL) infusion   HYDROmorphone (DILAUDID) injection 0.5 mg   iohexol (OMNIPAQUE) 350 MG/ML injection 75 mL   amLODipine (NORVASC) tablet 10 mg   carvedilol (COREG) tablet 25 mg   DISCONTD: hydrALAZINE (APRESOLINE) tablet 100 mg   hydrALAZINE (APRESOLINE) injection 10 mg   DISCONTD: heparin injection 5,000 Units   sodium chloride flush (NS) 0.9 % injection 3 mL   sodium chloride flush (NS) 0.9 % injection 3-10 mL   sodium chloride flush (NS) 0.9 % injection 3-10 mL   albuterol (PROVENTIL) (2.5 MG/3ML) 0.083% nebulizer solution 2.5 mg   apixaban (ELIQUIS) tablet 2.5 mg   DISCONTD: carvedilol (COREG) tablet 25 mg   cloNIDine (CATAPRES) tablet 0.1 mg   hydrALAZINE (APRESOLINE) tablet 100 mg   sodium zirconium cyclosilicate (LOKELMA) packet 10 g   albuterol (VENTOLIN HFA) 108 (90 Base) MCG/ACT inhaler 2 puff   rosuvastatin (CRESTOR) tablet 10 mg   sevelamer carbonate (RENVELA) powder PACK 2.4 g   carvedilol (COREG) tablet 25 mg    -I have reviewed the patients home medicines and have made adjustments as needed   Consultations Obtained: I requested consultation with the hospitalist, nephrologist,  and discussed lab and imaging findings as well as pertinent plan - they recommend:  admission   Cardiac Monitoring: The patient was maintained on a cardiac monitor.  I personally viewed and interpreted the cardiac monitored which showed an underlying rhythm of: NSR  Social Determinants of Health:  Diagnosis or treatment significantly limited by social determinants of health: former smoker   Reevaluation: After the interventions noted above, I reevaluated the patient and found that their symptoms have improved  Co morbidities that complicate the patient evaluation  Past Medical History:  Diagnosis Date   Acute cardioembolic stroke (HCC) 02/26/2022   AF (paroxysmal atrial fibrillation) (HCC) 05/29/2019   on Coumadin   Aortic atherosclerosis (HCC) 07/05/2019   Aortic dissection (HCC) 04/04/2019   s/p repair   Bone spur 2008   Right calcaneal foot spur   Breast cancer (HCC) 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 Chevis Pretty; S/P re-excision of superior and inferior margins 10/27/2012.    Dissection of aorta (HCC) 04/03/2019   ESRD on hemodialysis (HCC) 05/29/2019   Essential hypertension 09/16/2006   GERD 09/16/2006   Hepatitis C    treated and RNA confirmed not detectable 01/2017   Insomnia 03/14/2015   Malnutrition of moderate degree 05/19/2019   Non compliance with medical treatment 12/04/2017   Normocytic anemia    With thrombocytosis   Osteoarthritis    Right ureteral stone 2002   S/P lumbar spinal fusion 01/18/2014   S/P lumbar decompressive laminectomy, fusion, and plating for lumbar spinal  stenosis on 05/27/2009 by Dr. Tia Alert.  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 Tia Alert.     Stroke (cerebrum) (HCC)    Stroke (HCC) 02/23/2022   SVT (supraventricular tachycardia) (HCC) 08/28/2019   Tobacco use disorder 04/19/2009   Uterine fibroid    Wears dentures    top      Dispostion: Disposition decision including need for hospitalization was considered, and patient admitted to the hospital.    Final Clinical Impression(s) / ED Diagnoses Final diagnoses:  Hypertensive emergency  Acute hypoxic respiratory failure (HCC)     This chart was dictated using voice recognition software.  Despite best efforts to proofread,  errors can occur which can change the documentation meaning.    Lonell Grandchild, MD 08/21/23 1311

## 2023-08-22 DIAGNOSIS — J189 Pneumonia, unspecified organism: Secondary | ICD-10-CM | POA: Diagnosis present

## 2023-08-22 DIAGNOSIS — I4510 Unspecified right bundle-branch block: Secondary | ICD-10-CM | POA: Diagnosis present

## 2023-08-22 DIAGNOSIS — J441 Chronic obstructive pulmonary disease with (acute) exacerbation: Secondary | ICD-10-CM | POA: Diagnosis present

## 2023-08-22 DIAGNOSIS — I480 Paroxysmal atrial fibrillation: Secondary | ICD-10-CM | POA: Diagnosis present

## 2023-08-22 DIAGNOSIS — I491 Atrial premature depolarization: Secondary | ICD-10-CM | POA: Diagnosis present

## 2023-08-22 DIAGNOSIS — G8929 Other chronic pain: Secondary | ICD-10-CM | POA: Diagnosis present

## 2023-08-22 DIAGNOSIS — I132 Hypertensive heart and chronic kidney disease with heart failure and with stage 5 chronic kidney disease, or end stage renal disease: Secondary | ICD-10-CM | POA: Diagnosis present

## 2023-08-22 DIAGNOSIS — I700 Atherosclerosis of aorta: Secondary | ICD-10-CM | POA: Diagnosis present

## 2023-08-22 DIAGNOSIS — Z79899 Other long term (current) drug therapy: Secondary | ICD-10-CM | POA: Diagnosis not present

## 2023-08-22 DIAGNOSIS — N186 End stage renal disease: Secondary | ICD-10-CM | POA: Diagnosis present

## 2023-08-22 DIAGNOSIS — F1410 Cocaine abuse, uncomplicated: Secondary | ICD-10-CM | POA: Diagnosis present

## 2023-08-22 DIAGNOSIS — R0602 Shortness of breath: Secondary | ICD-10-CM | POA: Diagnosis present

## 2023-08-22 DIAGNOSIS — J440 Chronic obstructive pulmonary disease with acute lower respiratory infection: Secondary | ICD-10-CM | POA: Diagnosis present

## 2023-08-22 DIAGNOSIS — I5022 Chronic systolic (congestive) heart failure: Secondary | ICD-10-CM | POA: Diagnosis present

## 2023-08-22 DIAGNOSIS — E875 Hyperkalemia: Secondary | ICD-10-CM | POA: Diagnosis present

## 2023-08-22 DIAGNOSIS — J9601 Acute respiratory failure with hypoxia: Secondary | ICD-10-CM | POA: Diagnosis present

## 2023-08-22 DIAGNOSIS — M1990 Unspecified osteoarthritis, unspecified site: Secondary | ICD-10-CM | POA: Diagnosis present

## 2023-08-22 DIAGNOSIS — E785 Hyperlipidemia, unspecified: Secondary | ICD-10-CM | POA: Diagnosis present

## 2023-08-22 DIAGNOSIS — K219 Gastro-esophageal reflux disease without esophagitis: Secondary | ICD-10-CM | POA: Diagnosis present

## 2023-08-22 DIAGNOSIS — I69351 Hemiplegia and hemiparesis following cerebral infarction affecting right dominant side: Secondary | ICD-10-CM | POA: Diagnosis not present

## 2023-08-22 DIAGNOSIS — M549 Dorsalgia, unspecified: Secondary | ICD-10-CM | POA: Diagnosis present

## 2023-08-22 DIAGNOSIS — D631 Anemia in chronic kidney disease: Secondary | ICD-10-CM | POA: Diagnosis present

## 2023-08-22 DIAGNOSIS — Z992 Dependence on renal dialysis: Secondary | ICD-10-CM | POA: Diagnosis not present

## 2023-08-22 DIAGNOSIS — I161 Hypertensive emergency: Secondary | ICD-10-CM | POA: Diagnosis present

## 2023-08-22 DIAGNOSIS — N2581 Secondary hyperparathyroidism of renal origin: Secondary | ICD-10-CM | POA: Diagnosis present

## 2023-08-22 LAB — COMPREHENSIVE METABOLIC PANEL
ALT: 8 U/L (ref 0–44)
AST: 21 U/L (ref 15–41)
Albumin: 3.4 g/dL — ABNORMAL LOW (ref 3.5–5.0)
Alkaline Phosphatase: 55 U/L (ref 38–126)
Anion gap: 14 (ref 5–15)
BUN: 15 mg/dL (ref 8–23)
CO2: 26 mmol/L (ref 22–32)
Calcium: 8.8 mg/dL — ABNORMAL LOW (ref 8.9–10.3)
Chloride: 94 mmol/L — ABNORMAL LOW (ref 98–111)
Creatinine, Ser: 4.98 mg/dL — ABNORMAL HIGH (ref 0.44–1.00)
GFR, Estimated: 9 mL/min — ABNORMAL LOW (ref >60)
Glucose, Bld: 113 mg/dL — ABNORMAL HIGH (ref 70–99)
Potassium: 3.6 mmol/L (ref 3.5–5.1)
Sodium: 134 mmol/L — ABNORMAL LOW (ref 135–145)
Total Bilirubin: 0.5 mg/dL (ref 0.0–1.2)
Total Protein: 6.7 g/dL (ref 6.5–8.1)

## 2023-08-22 LAB — CBC
HCT: 37.6 % (ref 36.0–46.0)
Hemoglobin: 11.7 g/dL — ABNORMAL LOW (ref 12.0–15.0)
MCH: 29.7 pg (ref 26.0–34.0)
MCHC: 31.1 g/dL (ref 30.0–36.0)
MCV: 95.4 fL (ref 80.0–100.0)
Platelets: 221 10*3/uL (ref 150–400)
RBC: 3.94 MIL/uL (ref 3.87–5.11)
RDW: 19 % — ABNORMAL HIGH (ref 11.5–15.5)
WBC: 4.9 10*3/uL (ref 4.0–10.5)
nRBC: 0 % (ref 0.0–0.2)

## 2023-08-22 MED ORDER — OXYCODONE HCL 5 MG PO TABS
5.0000 mg | ORAL_TABLET | Freq: Four times a day (QID) | ORAL | Status: DC | PRN
  Administered 2023-08-22 (×2): 5 mg via ORAL
  Filled 2023-08-22 (×2): qty 1

## 2023-08-22 MED ORDER — LEVALBUTEROL HCL 0.63 MG/3ML IN NEBU: 0.6300 mg | INHALATION_SOLUTION | RESPIRATORY_TRACT | Status: DC | PRN

## 2023-08-22 MED ORDER — DOXYCYCLINE HYCLATE 100 MG PO TABS
100.0000 mg | ORAL_TABLET | Freq: Two times a day (BID) | ORAL | Status: DC
  Administered 2023-08-22 – 2023-08-23 (×3): 100 mg via ORAL
  Filled 2023-08-22 (×3): qty 1

## 2023-08-22 MED ORDER — MORPHINE SULFATE (PF) 2 MG/ML IV SOLN: 2.0000 mg | INTRAVENOUS | Status: DC | PRN

## 2023-08-22 NOTE — Plan of Care (Signed)

## 2023-08-22 NOTE — Progress Notes (Signed)
 [facility] Progress Note  Subjective:  3 L UF w/ HD last night Feeling better today  Vitals:   08/22/23 0533 08/22/23 0744 08/22/23 0924 08/22/23 1013  BP: (!) 158/103 (!) 158/109 (!) 155/88 106/66  Pulse: 90 (!) 52    Resp: 16 18 18    Temp: 97.9 F (36.6 C) 98 F (36.7 C)    TempSrc: Axillary Oral    SpO2:  100%    Weight:  35.8 kg    Height:        Exam: Gen alert, no distress No jvd or bruits Chest clear bilat  RRR no MRG Abd soft ntnd no mass or ascites +bs Ext no LE or UE edema Neuro is alert, Ox 3 , nf         Renal-related home meds: Norvasc 10 mg daily  Coreg 25 mg twice daily Clonidine 0.1 mg 3 times daily Hydralazine 100 mg twice daily Lokelma as needed Renvela 2.4 g AC 3 times daily      OP HD: [facility] TTS 3.5h  400 BFR 2K bath TDC -Dry weight pending     Assessment/ Plan: Hypertensive urgency: sig better today, 160/90s.   Shortness of breath/ vol overload: mild pulmonary edema on CXR. 3 L UF w/ HD last night. Looks better. Would wean O2 down and if can off O2 ok to d/c from renal standpoint.  ESRD: on HD TTS. Had HD overnight. No further needs at this time.  Hyperkalemia: K+ 6.0, after HD is corrected.  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 Inpatient medications:  apixaban  2.5 mg Oral Q12H   carvedilol  25 mg Oral Q12H   cloNIDine  0.1 mg Oral TID   doxycycline  100 mg Oral Q12H   heparin sodium (porcine)  2,600 Units Intracatheter Q T,Th,Sa-HD   hydrALAZINE  100 mg Oral Q12H   rosuvastatin  10 mg Oral Daily    sevelamer carbonate  2.4 g Oral TID WC   sodium chloride flush  10-40 mL Intracatheter Q12H   sodium chloride flush  3 mL Intravenous Q12H   sodium chloride flush  3-10 mL Intravenous Q12H    cefTRIAXone (ROCEPHIN)  IV 2 g (08/21/23 1659)   labetalol, levalbuterol, morphine injection, oxyCODONE, sodium chloride flush, sodium chloride flush

## 2023-08-22 NOTE — Progress Notes (Addendum)
 PROGRESS NOTE  Monique Henderson WUJ:[id number] DOB: [birth year] DOA: 08/21/2023 PCP: Fleet Contras, MD   LOS: 0 days   Brief narrative: Monique Henderson is a 73 y.o. female with past medical history  of  ESRD on hemodialysis (TTS), A-fib on Eliquis, hypertension, hyperlipidemia, COPD, history of right breast cancer status postlumpectomy in 2014, GERD, hepatitis C treated in 2018, moderate protein calorie malnutrition, history of stroke with right-sided deficit, chronic lower back pain status post lumbar spinal fusion surgery in 2010, presented to the hospital with shortness of breath and respiratory distress with chest discomfort.  Patient with admission in the past for similar presentation in November 2024.  On dialysis Tuesday Thursday and Saturday.  Patient admission on 08/10/2023 with shortness of breath when she was admitted to the hospital.  History of cocaine abuse and accelerated hypertension.  In the ED patient was hypertensive and required supplemental oxygen.  Initial blood pressure was 216/126.  Labs showed potassium of 6.0.  Creatinine of 8.5.  CBC within normal limits.  EKG showed normal sinus rhythm with right bundle branch block.  Chest x-ray showed interstitial opacities and patchy bibasilar opacities.  CT angiogram of the chest showed no evidence of dissection.  Patchy right lower lobe groundglass opacities noted.  2D echocardiogram showed LV ejection fraction of 45% and patient was admitted hospital for further evaluation and treatment.    Assessment/Plan: Active Problems:   SOB (shortness of breath)  PAF: With intermittent episodes of tachycardia.  Continue Eliquis.   CHA2DS2/VAS score more than 6.   Will continue to monitor closely.  Latest magnesium was 2.4.  Latest potassium of 4.2.  Will change albuterol to levalbuterol.  Focus on pain management.   Chronic systolic HFrEF at 45-50%: Patient appears to be volume overloaded with pleural effusion.  Continue hemodialysis for  volume management.  Still on supplemental oxygen.  Will continue to wean oxygen as able.   ESRD on HD: Patient is Tuesday Thursday Saturday hemodialysis.  Nephrology on and underwent hemodialysis.  Hypertensive urgency  Received nitroglycerin drip initially.  Now on Coreg clonidine hydralazine and labetalol with improved blood pressure.   Hyperkalemia: Received Lokelma.  Continue to monitor BMP.  Latest potassium of 3.6.   Possible right basal pneumonia. On Rocephin. Will add doxycycline for community acquired pneumonia coverage  DVT prophylaxis: apixaban (ELIQUIS) tablet 2.5 mg Start: 08/21/23 1300 apixaban (ELIQUIS) tablet 2.5 mg   Disposition: Home likely in 1 to 2 days  Status is: Observation The patient will require care spanning > 2 midnights and should be moved to inpatient because: IV antibiotic, pending clinical improvement, on supplemental oxygen    Code Status:     Code Status: Full Code  Family Communication: None at bedside  Consultants: Nephrology  Procedures: Hemodialysis  Anti-infectives:  Rocephin will add doxycycline  Anti-infectives (From admission, onward)    Start     Dose/Rate Route Frequency Ordered Stop   08/22/23 1245  doxycycline (VIBRA-TABS) tablet 100 mg        100 mg Oral Every 12 hours 08/22/23 1149     08/21/23 1430  cefTRIAXone (ROCEPHIN) 2 g in sodium chloride 0.9 % 100 mL IVPB        2 g 200 mL/hr over 30 Minutes Intravenous Every 24 hours 08/21/23 1333 08/26/23 1429       Subjective: Today, patient was seen and examined at bedside.  Complains of cough congestion and mild shortness of breath.  Still on supplemental oxygen.  Denies any chest  pain.  Has back pain.  Nursing staff reported intermittent episodes of tachycardia.  Objective: Vitals:   08/22/23 0924 08/22/23 1013  BP: (!) 155/88 106/66  Pulse:    Resp: 18   Temp:    SpO2:      Intake/Output Summary (Last 24 hours) at 08/22/2023 1150 Last data filed at 08/21/2023  2116 Gross per 24 hour  Intake --  Output 3000 ml  Net -3000 ml   Filed Weights   08/21/23 2115 08/22/23 0500 08/22/23 0744  Weight: 35.3 kg 35.8 kg 35.8 kg   Body mass index is 13.98 kg/m.   Physical Exam:  GENERAL: Patient is alert awake and oriented. Not in obvious distress.  Thinly built, on nasal cannula oxygen HENT: No scleral pallor or icterus. Pupils equally reactive to light. Oral mucosa is moist NECK: is supple, no gross swelling noted. CHEST: Clear to auscultation  Diminished breath sounds bilaterally. CVS: S1 and S2 heard, no murmur.  Irregular rhythm. ABDOMEN: Soft, non-tender, bowel sounds are present. EXTREMITIES: No edema. CNS: Cranial nerves are intact. No focal motor deficits. SKIN: warm and dry without rashes.  Data Review: I have personally reviewed the following laboratory data and studies,  CBC: Recent Labs  Lab 08/21/23 0823 08/22/23 0014  WBC 6.0 4.9  NEUTROABS 4.5  --   HGB 12.6 11.7*  HCT 40.7 37.6  MCV 97.1 95.4  PLT 276 221   Basic Metabolic Panel: Recent Labs  Lab 08/21/23 0823 08/21/23 1459 08/22/23 0014  NA 138 139 134*  K 6.0* 4.2 3.6  CL 96* 97* 94*  CO2 26 26 26   GLUCOSE 112* 196* 113*  BUN 36* 39* 15  CREATININE 8.50* 8.81* 4.98*  CALCIUM 9.2 8.9 8.8*  MG 2.4  --   --   PHOS  --  6.4*  --    Liver Function Tests: Recent Labs  Lab 08/21/23 0823 08/22/23 0014  AST 33 21  ALT 11 8  ALKPHOS 71 55  BILITOT 0.9 0.5  PROT 7.7 6.7  ALBUMIN 4.1 3.4*   No results for input(s): "LIPASE", "AMYLASE" in the last 168 hours. No results for input(s): "AMMONIA" in the last 168 hours. Cardiac Enzymes: No results for input(s): "CKTOTAL", "CKMB", "CKMBINDEX", "TROPONINI" in the last 168 hours. BNP (last 3 results) Recent Labs    05/06/23 0333 06/18/23 0334 08/21/23 0823  BNP >4,500.0* >4,500.0* >4,500.0*    ProBNP (last 3 results) No results for input(s): "PROBNP" in the last 8760 hours.  CBG: Recent Labs  Lab  08/21/23 1454  GLUCAP 197*   No results found for this or any previous visit (from the past 240 hours).   Studies: CT Angio Chest/Abd/Pel for Dissection W and/or Wo Contrast Result Date: 08/21/2023 CLINICAL DATA:  Several day history of cough, chills, and new shortness of breath with hypoxia EXAM: CT ANGIOGRAPHY CHEST, ABDOMEN AND PELVIS TECHNIQUE: Non-contrast CT of the chest was initially obtained. Multidetector CT imaging through the chest, abdomen and pelvis was performed using the standard protocol during bolus administration of intravenous contrast. Multiplanar reconstructed images and MIPs were obtained and reviewed to evaluate the vascular anatomy. RADIATION DOSE REDUCTION: This exam was performed according to the departmental dose-optimization program which includes automated exposure control, adjustment of the mA and/or kV according to patient size and/or use of iterative reconstruction technique. CONTRAST:  75mL OMNIPAQUE IOHEXOL 350 MG/ML SOLN COMPARISON:  CTA chest, abdomen, and pelvis dated 01/14/2023 and multiple priors FINDINGS: CTA CHEST FINDINGS Cardiovascular: Right IJ central venous  catheter terminates in the right atrium. Preferential opacification of the thoracic aorta. Similar postsurgical changes of the ascending aorta measuring 4.6 x 4.1 cm (7:65, remeasured). No evidence of thoracic aortic dissection. Similar multichamber cardiomegaly. No pericardial effusion. Coronary artery calcifications. 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 Reflux of contrast material into the hepatic veins, suggesting a degree of right heart dysfunction. Mediastinum/Nodes: Imaged thyroid gland without nodules meeting criteria for imaging follow-up by size. Normal esophagus. Unchanged 10 mm right hilar lymph node (7:58) and 17 mm subcarinal lymph node (7:62). Lungs/Pleura: The  central airways are patent. Diffuse interlobular septal thickening, right-greater-than-left. Patchy right lower lobe ground-glass opacities. Asymmetric right bronchial wall thickening. No pneumothorax. Unchanged moderate right and trace left pleural effusions. Musculoskeletal: No acute or abnormal lytic or blastic osseous lesions. Median sternotomy wires are nondisplaced. Multilevel degenerative changes of the thoracic spine. Flowing anterior osteophytes over at least 4 contiguous thoracic vertebrae which can be seen in the setting of diffuse idiopathic skeletal hyperostosis (DISH). Review of the MIP images confirms the above findings. CTA ABDOMEN AND PELVIS FINDINGS VASCULAR Severe aortic and branch atherosclerosis. Normal caliber of the abdominal aorta. No evidence of aortic aneurysm, dissection, or acute aortic abnormality. NON-VASCULAR Hepatobiliary: No focal hepatic lesions. No intra or extrahepatic biliary ductal dilation. Normal gallbladder. Pancreas: No focal lesions or main ductal dilation. Spleen: Normal in size without focal abnormality. Adrenals/Urinary Tract: No adrenal nodules. Atrophic kidneys without hydronephrosis. 2.6 cm exophytic right upper pole hypodensity (7:157), increased in size from 1.8 cm on 11/18/2021. Simple-appearing cyst in the lower pole left kidney and subcentimeter hyperdense cyst in the upper pole. No specific follow-up imaging recommended. No focal bladder wall thickening. Stomach/Bowel: Normal appearance of the stomach. No evidence of bowel wall thickening, distention, or inflammatory changes. Colonic diverticulosis without acute diverticulitis. Normal appendix. Lymphatic: No enlarged abdominal or pelvic lymph nodes. Reproductive: No adnexal masses. Multifocal coarse calcifications within the uterus, likely leiomyomata. Other: Small volume free fluid.  No free air or fluid collection. Musculoskeletal: No acute or abnormal lytic or blastic osseous lesions. Multilevel degenerative  changes of the lumbar spine. Postsurgical changes of L4-5 superior spinous process and L2-3 fusion. Unchanged fat containing left lumbar hernias. Mild body wall edema. Review of the MIP images confirms the above findings. IMPRESSION: 1. No evidence of aortic dissection. Similar postsurgical changes of the ascending aorta. 2. Similar multichamber cardiomegaly with asymmetric right pulmonary edema and unchanged moderate right and trace left pleural effusions. 3. Patchy right lower lobe ground-glass opacities, which may be superimposed infection or inflammation. 4. Unchanged mildly enlarged right hilar and subcarinal lymph nodes, likely reactive. 5. Increased size of a 2.6 cm exophytic right upper pole renal hypodensity. Recommend further nonemergent characterization. While renal protocol MRI or CT are preferred, in the setting of renal failure, renal ultrasound may be pursued first. 6. Aortic Atherosclerosis (ICD10-I70.0). Coronary artery calcifications. Assessment for potential risk factor modification, dietary therapy or pharmacologic therapy may be warranted, if clinically indicated. Electronically Signed   By: Agustin Cree M.D.   On: 08/21/2023 10:42   DG Chest Port 1 View Result Date: 08/21/2023 CLINICAL DATA:  Cough EXAM: PORTABLE CHEST 1 VIEW COMPARISON:  Chest radiograph dated 08/09/2023 FINDINGS: Lines/tubes: Right internal jugular venous catheter tip projects over the right atrium. Surgical clips project over the right axilla. Lungs: Patient is rotated slightly to the left. Well inflated lungs. Similar interstitial opacities and patchy  bibasilar opacities, right-greater-than-left. Pleura: No pneumothorax or pleural effusion. Heart/mediastinum: Similar markedly enlarged cardiomediastinal silhouette. Bones: Median sternotomy wires are nondisplaced. IMPRESSION: 1. Similar interstitial opacities and patchy bibasilar opacities, right-greater-than-left, which may represent pulmonary edema or infection. 2. Similar  marked cardiomegaly. Electronically Signed   By: Agustin Cree M.D.   On: 08/21/2023 08:40      Joycelyn Das, MD  [facility] 08/22/2023  If 7PM-7AM, please contact night-coverage

## 2023-08-22 NOTE — Progress Notes (Signed)
 Patient's BP was on higher side, she was asymptomatic, scheduled medications given, BP rechecked.   08/22/23 0744  Vitals  Temp 98 F (36.7 C)  Temp Source Oral  BP (!) 158/109  MAP (mmHg) 125  BP Location Right Arm  BP Method Automatic  Patient Position (if appropriate) Lying  Pulse Rate (!) 52  Pulse Rate Source Monitor  ECG Heart Rate 87  Resp 18  Level of Consciousness  Level of Consciousness Alert  MEWS COLOR  MEWS Score Color Green  Oxygen Therapy  SpO2 100 %  O2 Device Nasal Cannula  O2 Flow Rate (L/min) 3 L/min  Pain Assessment  Pain Scale 0-10  Pain Score 3  Height and Weight  Weight 35.8 kg  BMI (Calculated) 13.98  MEWS Score  MEWS Temp 0  MEWS Systolic 0  MEWS Pulse 0  MEWS RR 0  MEWS LOC 0  MEWS Score 0

## 2023-08-23 DIAGNOSIS — J441 Chronic obstructive pulmonary disease with (acute) exacerbation: Secondary | ICD-10-CM | POA: Diagnosis not present

## 2023-08-23 LAB — BASIC METABOLIC PANEL
Anion gap: 12 (ref 5–15)
BUN: 39 mg/dL — ABNORMAL HIGH (ref 8–23)
CO2: 27 mmol/L (ref 22–32)
Calcium: 8.3 mg/dL — ABNORMAL LOW (ref 8.9–10.3)
Chloride: 96 mmol/L — ABNORMAL LOW (ref 98–111)
Creatinine, Ser: 8.82 mg/dL — ABNORMAL HIGH (ref 0.44–1.00)
GFR, Estimated: 4 mL/min — ABNORMAL LOW (ref >60)
Glucose, Bld: 134 mg/dL — ABNORMAL HIGH (ref 70–99)
Potassium: 4.2 mmol/L (ref 3.5–5.1)
Sodium: 135 mmol/L (ref 135–145)

## 2023-08-23 LAB — CBC
HCT: 34.8 % — ABNORMAL LOW (ref 36.0–46.0)
Hemoglobin: 10.6 g/dL — ABNORMAL LOW (ref 12.0–15.0)
MCH: 29.4 pg (ref 26.0–34.0)
MCHC: 30.5 g/dL (ref 30.0–36.0)
MCV: 96.7 fL (ref 80.0–100.0)
Platelets: 193 10*3/uL (ref 150–400)
RBC: 3.6 MIL/uL — ABNORMAL LOW (ref 3.87–5.11)
RDW: 18.5 % — ABNORMAL HIGH (ref 11.5–15.5)
WBC: 3.6 10*3/uL — ABNORMAL LOW (ref 4.0–10.5)
nRBC: 0 % (ref 0.0–0.2)

## 2023-08-23 LAB — MAGNESIUM: Magnesium: 2.2 mg/dL (ref 1.7–2.4)

## 2023-08-23 MED ORDER — CEFUROXIME AXETIL 250 MG PO TABS: 750.0000 mg | ORAL_TABLET | Freq: Every day | ORAL | 0 refills | Status: AC

## 2023-08-23 MED ORDER — ALBUTEROL SULFATE HFA 108 (90 BASE) MCG/ACT IN AERS: 2.0000 | INHALATION_SPRAY | Freq: Four times a day (QID) | RESPIRATORY_TRACT | 2 refills | Status: DC | PRN

## 2023-08-23 MED ORDER — DOXYCYCLINE HYCLATE 100 MG PO TABS: 100.0000 mg | ORAL_TABLET | Freq: Two times a day (BID) | ORAL | 0 refills | Status: AC

## 2023-08-23 MED ORDER — ALBUTEROL SULFATE (2.5 MG/3ML) 0.083% IN NEBU: 2.5000 mg | INHALATION_SOLUTION | Freq: Four times a day (QID) | RESPIRATORY_TRACT | 12 refills | Status: AC | PRN

## 2023-08-23 NOTE — Discharge Planning (Signed)
 [facility] Patient Discharge Orders- [facility] CLINIC: [facility]  Patient's name: Monique Henderson Admit/DC Dates: 08/21/2023 - 08/23/2023  Discharge Diagnoses: Paroxysmal a.fib Volume overload   Possible right basal pneumonia  Aranesp: Given: no   Date and amount of last dose: N/A  Last Hgb: 10.6 PRBC's Given: no Date/# of units: N/A ESA dose for discharge: none IV Iron dose at discharge: none  Heparin change: no  EDW Change: Yes New EDW: 35.8kg  Bath Change: no  Access intervention/Change: no Details:  Hectorol/Calcitriol change: no  Discharge Labs: Calcium 8.3 Phosphorus 6.4 Albumin 3.4 K+ 4.2  IV Antibiotics: no Details:  On Coumadin?: no Last INR: Next INR: Managed By:   OTHER/APPTS/LAB ORDERS:    D/C Meds to be reconciled by nurse after every discharge.  Completed By: Rogers Blocker, PA-C 08/23/2023, 12:39 PM  [facility] Pager: [phone]    Reviewed by: MD:______ RN_______

## 2023-08-23 NOTE — Progress Notes (Signed)
 Heart Failure Navigator Progress Note  Assessed for Heart & Vascular TOC clinic readiness.  Patient does not meet criteria due to ESRD on hemodialysis.   Navigator will sign off at this time.   Rhae Hammock, BSN, Scientist, clinical (histocompatibility and immunogenetics) Only

## 2023-08-23 NOTE — Progress Notes (Signed)
 Reviewed AVS, patient expressed understanding of medications, MD follow up reviewed.   Removed IV, Site clean, dry and intact.  Patient states all belongings brought to the hospital at time of admission are accounted for and packed to take home.  Patient where to pick up new prescriptions.  Pt transported to Discharge lounge to wait for transportation home.

## 2023-08-23 NOTE — Discharge Summary (Signed)
 Physician Discharge Summary  Monique Henderson ZDG:[id number] DOB: 1951/01/14 DOA: 08/21/2023  PCP: Fleet Contras, MD  Admit date: 08/21/2023 Discharge date: 08/23/2023  Admitted From: Home  Discharge disposition: Home  Recommendations for Outpatient Follow-Up:   Follow up with your primary care provider in one week.  Check CBC, BMP, magnesium in the next visit Continue hemodialysis as outpatient.  Discharge Diagnosis:   Principal Problem:   Acute exacerbation of chronic obstructive pulmonary disease (COPD) (HCC) Active Problems:   SOB (shortness of breath)  Discharge Condition: Improved.  Diet recommendation: Low sodium, heart healthy.    Wound care: None.  Code status: Full.   History of Present Illness:   Monique Henderson is a 73 y.o. female with past medical history  of  ESRD on hemodialysis (TTS), A-fib on Eliquis, hypertension, hyperlipidemia, COPD, history of right breast cancer status postlumpectomy in 2014, GERD, hepatitis C treated in 2018, moderate protein calorie malnutrition, history of stroke with right-sided deficit, chronic lower back pain status post lumbar spinal fusion surgery in 2010, presented to the hospital with shortness of breath and respiratory distress with chest discomfort.  Patient with admission in the past for similar presentation in November 2024.  On dialysis Tuesday Thursday and Saturday.  Patient admission on 08/10/2023 with shortness of breath when she was admitted to the hospital.  History of cocaine abuse and accelerated hypertension.  In the ED, patient was hypertensive and required supplemental oxygen.  Initial blood pressure was 216/126.  Labs showed potassium of 6.0.  Creatinine of 8.5.  CBC within normal limits.  EKG showed normal sinus rhythm with right bundle branch block.  Chest x-ray showed interstitial opacities and patchy bibasilar opacities.  CT angiogram of the chest showed no evidence of dissection.  Patchy right lower lobe  groundglass opacities noted.  2D echocardiogram showed LV ejection fraction of 45% and patient was admitted hospital for further evaluation and treatment.    Hospital Course:   Following conditions were addressed during hospitalization as listed below,  Paroxysmal atrial fibrillation With intermittent episodes of tachycardia.  Continue Eliquis.   CHA2DS2/VAS score more than 6.     Latest magnesium was 2.2  Latest potassium of 4.2.  Rate controlled at this time.   Chronic systolic HFrEF at 45-50%: Patient appeared to be volume overloaded with pleural effusion.  Continue hemodialysis for volume management.  offl oxygen at this time.  ESRD on HD: Patient is Tuesday Thursday Saturday hemodialysis.  Received hemodialysis during hospitalization.  Will continue dialysis after discharge.   Hypertensive urgency  Received nitroglycerin drip initially.  Now on Coreg clonidine hydralazine and labetalol with improved blood pressure.   Hyperkalemia: Received Lokelma.  Continue to monitor BMP.  Latest potassium of 4.2.   Possible right basal pneumonia. Received Rocephin and doxycycline.  Will continue with cefuroxime and doxycycline for 3 more days to complete the 5-day course.  Disposition.  At this time, patient is stable for disposition home with outpatient PCP follow-up.  Medical Consultants:   Nephrology  Procedures:    Hemodialysis Subjective:   Today, patient was seen and examined at bedside.  Feels okay.  No chest pain, shortness of breath dyspnea.  Off oxygen since yesterday.  Ambulating in the hallway.  Discharge Exam:   Vitals:   08/23/23 0807 08/23/23 0809  BP: (!) 180/83 (!) 171/87  Pulse: 64 65  Resp: 13 (!) 21  Temp:    SpO2: 99% 100%   Vitals:   08/23/23 0311 08/23/23 0802 08/23/23  1610 08/23/23 0809  BP: (!) 178/93 (!) 157/127 (!) 180/83 (!) 171/87  Pulse: 68 66 64 65  Resp: 19 16 13  (!) 21  Temp: 98.2 F (36.8 C) 97.7 F (36.5 C)    TempSrc: Oral Oral     SpO2: 100% 100% 99% 100%  Weight:      Height:       Body mass index is 13.98 kg/m.  General: Alert awake, not in obvious distress, thinly built, on room air, HENT: pupils equally reacting to light,  No scleral pallor or icterus noted. Oral mucosa is moist.  Chest:  Clear breath sounds.  No crackles or wheezes.  CVS: S1 &S2 heard. No murmur.  Regular rate and rhythm. Abdomen: Soft, nontender, nondistended.  Bowel sounds are heard.   Extremities: No cyanosis, clubbing or edema.  Peripheral pulses are palpable. Psych: Alert, awake and oriented, normal mood CNS:  No cranial nerve deficits.  Power equal in all extremities.   Skin: Warm and dry.  No rashes noted.  The results of significant diagnostics from this hospitalization (including imaging, microbiology, ancillary and laboratory) are listed below for reference.     Diagnostic Studies:   CT Angio Chest/Abd/Pel for Dissection W and/or Wo Contrast Result Date: 08/21/2023 CLINICAL DATA:  Several day history of cough, chills, and new shortness of breath with hypoxia EXAM: CT ANGIOGRAPHY CHEST, ABDOMEN AND PELVIS TECHNIQUE: Non-contrast CT of the chest was initially obtained. Multidetector CT imaging through the chest, abdomen and pelvis was performed using the standard protocol during bolus administration of intravenous contrast. Multiplanar reconstructed images and MIPs were obtained and reviewed to evaluate the vascular anatomy. RADIATION DOSE REDUCTION: This exam was performed according to the departmental dose-optimization program which includes automated exposure control, adjustment of the mA and/or kV according to patient size and/or use of iterative reconstruction technique. CONTRAST:  75mL OMNIPAQUE IOHEXOL 350 MG/ML SOLN COMPARISON:  CTA chest, abdomen, and pelvis dated 01/14/2023 and multiple priors FINDINGS: CTA CHEST FINDINGS Cardiovascular: Right IJ central venous catheter terminates in the right atrium. Preferential opacification  of the thoracic aorta. Similar postsurgical changes of the ascending aorta measuring 4.6 x 4.1 cm (7:65, remeasured). No evidence of thoracic aortic dissection. Similar multichamber cardiomegaly. No pericardial effusion. Coronary artery calcifications. 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 Reflux of contrast material into the hepatic veins, suggesting a degree of right heart dysfunction. Mediastinum/Nodes: Imaged thyroid gland without nodules meeting criteria for imaging follow-up by size. Normal esophagus. Unchanged 10 mm right hilar lymph node (7:58) and 17 mm subcarinal lymph node (7:62). Lungs/Pleura: The central airways are patent. Diffuse interlobular septal thickening, right-greater-than-left. Patchy right lower lobe ground-glass opacities. Asymmetric right bronchial wall thickening. No pneumothorax. Unchanged moderate right and trace left pleural effusions. Musculoskeletal: No acute or abnormal lytic or blastic osseous lesions. Median sternotomy wires are nondisplaced. Multilevel degenerative changes of the thoracic spine. Flowing anterior osteophytes over at least 4 contiguous thoracic vertebrae which can be seen in the setting of diffuse idiopathic skeletal hyperostosis (DISH). Review of the MIP images confirms the above findings. CTA ABDOMEN AND PELVIS FINDINGS VASCULAR Severe aortic and branch atherosclerosis. Normal caliber of the abdominal aorta. No evidence of aortic aneurysm, dissection, or acute aortic abnormality. NON-VASCULAR Hepatobiliary: No focal hepatic lesions. No intra or extrahepatic biliary ductal dilation. Normal gallbladder. Pancreas: No focal lesions or main ductal dilation. Spleen: Normal in size without focal abnormality. Adrenals/Urinary Tract:  No adrenal nodules. Atrophic kidneys without hydronephrosis. 2.6 cm exophytic right upper pole hypodensity  (7:157), increased in size from 1.8 cm on 11/18/2021. Simple-appearing cyst in the lower pole left kidney and subcentimeter hyperdense cyst in the upper pole. No specific follow-up imaging recommended. No focal bladder wall thickening. Stomach/Bowel: Normal appearance of the stomach. No evidence of bowel wall thickening, distention, or inflammatory changes. Colonic diverticulosis without acute diverticulitis. Normal appendix. Lymphatic: No enlarged abdominal or pelvic lymph nodes. Reproductive: No adnexal masses. Multifocal coarse calcifications within the uterus, likely leiomyomata. Other: Small volume free fluid.  No free air or fluid collection. Musculoskeletal: No acute or abnormal lytic or blastic osseous lesions. Multilevel degenerative changes of the lumbar spine. Postsurgical changes of L4-5 superior spinous process and L2-3 fusion. Unchanged fat containing left lumbar hernias. Mild body wall edema. Review of the MIP images confirms the above findings. IMPRESSION: 1. No evidence of aortic dissection. Similar postsurgical changes of the ascending aorta. 2. Similar multichamber cardiomegaly with asymmetric right pulmonary edema and unchanged moderate right and trace left pleural effusions. 3. Patchy right lower lobe ground-glass opacities, which may be superimposed infection or inflammation. 4. Unchanged mildly enlarged right hilar and subcarinal lymph nodes, likely reactive. 5. Increased size of a 2.6 cm exophytic right upper pole renal hypodensity. Recommend further nonemergent characterization. While renal protocol MRI or CT are preferred, in the setting of renal failure, renal ultrasound may be pursued first. 6. Aortic Atherosclerosis (ICD10-I70.0). Coronary artery calcifications. Assessment for potential risk factor modification, dietary therapy or pharmacologic therapy may be warranted, if clinically indicated. Electronically Signed   By: Agustin Cree M.D.   On: 08/21/2023 10:42   DG Chest Port 1  View Result Date: 08/21/2023 CLINICAL DATA:  Cough EXAM: PORTABLE CHEST 1 VIEW COMPARISON:  Chest radiograph dated 08/09/2023 FINDINGS: Lines/tubes: Right internal jugular venous catheter tip projects over the right atrium. Surgical clips project over the right axilla. Lungs: Patient is rotated slightly to the left. Well inflated lungs. Similar interstitial opacities and patchy bibasilar opacities, right-greater-than-left. Pleura: No pneumothorax or pleural effusion. Heart/mediastinum: Similar markedly enlarged cardiomediastinal silhouette. Bones: Median sternotomy wires are nondisplaced. IMPRESSION: 1. Similar interstitial opacities and patchy bibasilar opacities, right-greater-than-left, which may represent pulmonary edema or infection. 2. Similar marked cardiomegaly. Electronically Signed   By: Agustin Cree M.D.   On: 08/21/2023 08:40     Labs:   Basic Metabolic Panel: Recent Labs  Lab 08/21/23 0823 08/21/23 1459 08/22/23 0014 08/23/23 0400  NA 138 139 134* 135  K 6.0* 4.2 3.6 4.2  CL 96* 97* 94* 96*  CO2 26 26 26 27   GLUCOSE 112* 196* 113* 134*  BUN 36* 39* 15 39*  CREATININE 8.50* 8.81* 4.98* 8.82*  CALCIUM 9.2 8.9 8.8* 8.3*  MG 2.4  --   --  2.2  PHOS  --  6.4*  --   --    GFR Estimated Creatinine Clearance: 3.4 mL/min (A) (by C-G formula based on SCr of 8.82 mg/dL (H)). Liver Function Tests: Recent Labs  Lab 08/21/23 0823 08/22/23 0014  AST 33 21  ALT 11 8  ALKPHOS 71 55  BILITOT 0.9 0.5  PROT 7.7 6.7  ALBUMIN 4.1 3.4*   No results for input(s): "LIPASE", "AMYLASE" in the last 168 hours. No results for input(s): "AMMONIA" in the last 168 hours. Coagulation profile No results for input(s): "INR", "PROTIME" in the last 168 hours.  CBC: Recent Labs  Lab 08/21/23 0823 08/22/23 0014 08/23/23 0400  WBC 6.0 4.9  3.6*  NEUTROABS 4.5  --   --   HGB 12.6 11.7* 10.6*  HCT 40.7 37.6 34.8*  MCV 97.1 95.4 96.7  PLT 276 221 193   Cardiac Enzymes: No results for input(s):  "CKTOTAL", "CKMB", "CKMBINDEX", "TROPONINI" in the last 168 hours. BNP: Invalid input(s): "POCBNP" CBG: Recent Labs  Lab 08/21/23 1454  GLUCAP 197*   D-Dimer No results for input(s): "DDIMER" in the last 72 hours. Hgb A1c No results for input(s): "HGBA1C" in the last 72 hours. Lipid Profile No results for input(s): "CHOL", "HDL", "LDLCALC", "TRIG", "CHOLHDL", "LDLDIRECT" in the last 72 hours. Thyroid function studies No results for input(s): "TSH", "T4TOTAL", "T3FREE", "THYROIDAB" in the last 72 hours.  Invalid input(s): "FREET3" Anemia work up No results for input(s): "VITAMINB12", "FOLATE", "FERRITIN", "TIBC", "IRON", "RETICCTPCT" in the last 72 hours. Microbiology No results found for this or any previous visit (from the past 240 hours).   Discharge Instructions:   Discharge Instructions     Call MD for:  difficulty breathing, headache or visual disturbances   Complete by: As directed    Call MD for:  temperature >100.4   Complete by: As directed    Diet - low sodium heart healthy   Complete by: As directed    Discharge instructions   Complete by: As directed    Follow  up with your primary care provider in one week. Seek medical attention for worsening symptoms. Continue hemodialysis as scheduled. Complete the course of antibiotics,   Increase activity slowly   Complete by: As directed       Allergies as of 08/23/2023       Reactions   Shrimp [shellfish Allergy] Shortness Of Breath   Bactroban [mupirocin] Other (See Comments)   "Sores in nose"   Chlorhexidine Gluconate Itching   Hydrocodone Itching, Nausea And Vomiting, Nausea Only   Latex Itching   Latex gloves cause itchiness and a rash   Tylenol [acetaminophen] Itching   Takes Percocet at home 05/06/23   Zestril [lisinopril] Cough        Medication List     TAKE these medications    albuterol (2.5 MG/3ML) 0.083% nebulizer solution Commonly known as: PROVENTIL Take 3 mLs (2.5 mg total) by  nebulization every 6 (six) hours as needed for wheezing or shortness of breath.   albuterol 108 (90 Base) MCG/ACT inhaler Commonly known as: VENTOLIN HFA Inhale 2 puffs into the lungs every 6 (six) hours as needed for wheezing or shortness of breath.   amLODipine 10 MG tablet Commonly known as: NORVASC Take 1 tablet (10 mg total) by mouth daily. What changed: when to take this   apixaban 2.5 MG Tabs tablet Commonly known as: ELIQUIS Take 1 tablet (2.5 mg total) by mouth 2 (two) times daily.   carvedilol 25 MG tablet Commonly known as: COREG Take 1 tablet (25 mg total) by mouth 2 (two) times daily with a meal.   cefUROXime 250 MG tablet Commonly known as: CEFTIN Take 3 tablets (750 mg total) by mouth daily for 3 days. Notes to patient: Take in evening after HD on HD days   cloNIDine 0.1 MG tablet Commonly known as: Catapres Take 1 tablet (0.1 mg total) by mouth 3 (three) times daily.   doxycycline 100 MG tablet Commonly known as: VIBRA-TABS Take 1 tablet (100 mg total) by mouth every 12 (twelve) hours for 3 days.   hydrALAZINE 100 MG tablet Commonly known as: APRESOLINE Take 1 tablet (100 mg total) by mouth every  8 (eight) hours. What changed: when to take this   isosorbide mononitrate 30 MG 24 hr tablet Commonly known as: IMDUR TAKE 1 TABLET (30MG ) ON DIALYSIS DAYS AND 2 TABLETS (60 MG) ON NON DIALYSIS DAYS.   Lokelma 10 g Pack packet Generic drug: sodium zirconium cyclosilicate Take 10 g by mouth every Monday, Wednesday, and Friday.   MIRCERA IJ every 14 (fourteen) days.   naloxone 4 MG/0.1ML Liqd nasal spray kit Commonly known as: NARCAN Place 1 spray into the nose as needed (opioid overdose).   oxyCODONE-acetaminophen 10-325 MG tablet Commonly known as: PERCOCET Take 1 tablet by mouth in the morning and at bedtime.   pantoprazole 40 MG tablet Commonly known as: PROTONIX TAKE 1 TABLET (40 MG TOTAL) BY MOUTH 2 (TWO) TIMES DAILY.   rosuvastatin 10 MG  tablet Commonly known as: CRESTOR TAKE 1 TABLET (10 MG TOTAL) BY MOUTH DAILY FOR CHOLESTEROL What changed: See the new instructions.   senna-docusate 8.6-50 MG tablet Commonly known as: Senokot-S Take 1 tablet by mouth 2 (two) times daily between meals as needed for mild constipation.   sevelamer carbonate 2.4 g Pack Commonly known as: RENVELA Take 2.4 g by mouth 3 (three) times daily with meals.   traZODone 50 MG tablet Commonly known as: DESYREL Take 25-50 mg by mouth at bedtime as needed for sleep.   VITAMIN D-3 PO Take 1 capsule by mouth in the morning.        Follow-up Information     Fleet Contras, MD Follow up in 1 week(s).   Specialty: Internal Medicine Contact information: [address] [place] [address] [phone]                  Time coordinating discharge: 39 minutes  Signed:  Laylani Pudwill  [facility] 08/23/2023, 10:55 AM

## 2023-08-23 NOTE — Progress Notes (Signed)
 Patient ID: Monique Henderson, female   DOB: December 14, 1950, 73 y.o.   MRN: [id number] S: No complaints and planning on going home today O:BP (!) 171/87   Pulse 65   Temp 97.7 F (36.5 C) (Oral)   Resp (!) 21   Ht 5\' 3"  (1.6 m)   Wt 35.8 kg   SpO2 100%   BMI 13.98 kg/m   Intake/Output Summary (Last 24 hours) at 08/23/2023 0858 Last data filed at 08/23/2023 0311 Gross per 24 hour  Intake 840 ml  Output --  Net 840 ml   Intake/Output: I/O last 3 completed shifts: In: 840 [P.O.:840] Out: 3000 [Other:3000]  Intake/Output this shift:  No intake/output data recorded. Weight change: -8.5 kg Gen: NAD CVS: RRR Resp:CTA Abd: +BS, soft, TN/ND Ext: no edema  Recent Labs  Lab 08/21/23 0823 08/21/23 1459 08/22/23 0014 08/23/23 0400  NA 138 139 134* 135  K 6.0* 4.2 3.6 4.2  CL 96* 97* 94* 96*  CO2 26 26 26 27   GLUCOSE 112* 196* 113* 134*  BUN 36* 39* 15 39*  CREATININE 8.50* 8.81* 4.98* 8.82*  ALBUMIN 4.1  --  3.4*  --   CALCIUM 9.2 8.9 8.8* 8.3*  PHOS  --  6.4*  --   --   AST 33  --  21  --   ALT 11  --  8  --    Liver Function Tests: Recent Labs  Lab 08/21/23 0823 08/22/23 0014  AST 33 21  ALT 11 8  ALKPHOS 71 55  BILITOT 0.9 0.5  PROT 7.7 6.7  ALBUMIN 4.1 3.4*   No results for input(s): "LIPASE", "AMYLASE" in the last 168 hours. No results for input(s): "AMMONIA" in the last 168 hours. CBC: Recent Labs  Lab 08/21/23 0823 08/22/23 0014 08/23/23 0400  WBC 6.0 4.9 3.6*  NEUTROABS 4.5  --   --   HGB 12.6 11.7* 10.6*  HCT 40.7 37.6 34.8*  MCV 97.1 95.4 96.7  PLT 276 221 193   Cardiac Enzymes: No results for input(s): "CKTOTAL", "CKMB", "CKMBINDEX", "TROPONINI" in the last 168 hours. CBG: Recent Labs  Lab 08/21/23 1454  GLUCAP 197*    Iron Studies: No results for input(s): "IRON", "TIBC", "TRANSFERRIN", "FERRITIN" in the last 72 hours. Studies/Results: CT Angio Chest/Abd/Pel for Dissection W and/or Wo Contrast Result Date: 08/21/2023 CLINICAL DATA:   Several day history of cough, chills, and new shortness of breath with hypoxia EXAM: CT ANGIOGRAPHY CHEST, ABDOMEN AND PELVIS TECHNIQUE: Non-contrast CT of the chest was initially obtained. Multidetector CT imaging through the chest, abdomen and pelvis was performed using the standard protocol during bolus administration of intravenous contrast. Multiplanar reconstructed images and MIPs were obtained and reviewed to evaluate the vascular anatomy. RADIATION DOSE REDUCTION: This exam was performed according to the departmental dose-optimization program which includes automated exposure control, adjustment of the mA and/or kV according to patient size and/or use of iterative reconstruction technique. CONTRAST:  75mL OMNIPAQUE IOHEXOL 350 MG/ML SOLN COMPARISON:  CTA chest, abdomen, and pelvis dated 01/14/2023 and multiple priors FINDINGS: CTA CHEST FINDINGS Cardiovascular: Right IJ central venous catheter terminates in the right atrium. Preferential opacification of the thoracic aorta. Similar postsurgical changes of the ascending aorta measuring 4.6 x 4.1 cm (7:65, remeasured). No evidence of thoracic aortic dissection. Similar multichamber cardiomegaly. No pericardial effusion. Coronary artery calcifications. 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 Reflux of contrast  material into the hepatic veins, suggesting a degree of right heart dysfunction. Mediastinum/Nodes: Imaged thyroid gland without nodules meeting criteria for imaging follow-up by size. Normal esophagus. Unchanged 10 mm right hilar lymph node (7:58) and 17 mm subcarinal lymph node (7:62). Lungs/Pleura: The central airways are patent. Diffuse interlobular septal thickening, right-greater-than-left. Patchy right lower lobe ground-glass opacities. Asymmetric right bronchial wall thickening. No pneumothorax. Unchanged moderate right  and trace left pleural effusions. Musculoskeletal: No acute or abnormal lytic or blastic osseous lesions. Median sternotomy wires are nondisplaced. Multilevel degenerative changes of the thoracic spine. Flowing anterior osteophytes over at least 4 contiguous thoracic vertebrae which can be seen in the setting of diffuse idiopathic skeletal hyperostosis (DISH). Review of the MIP images confirms the above findings. CTA ABDOMEN AND PELVIS FINDINGS VASCULAR Severe aortic and branch atherosclerosis. Normal caliber of the abdominal aorta. No evidence of aortic aneurysm, dissection, or acute aortic abnormality. NON-VASCULAR Hepatobiliary: No focal hepatic lesions. No intra or extrahepatic biliary ductal dilation. Normal gallbladder. Pancreas: No focal lesions or main ductal dilation. Spleen: Normal in size without focal abnormality. Adrenals/Urinary Tract: No adrenal nodules. Atrophic kidneys without hydronephrosis. 2.6 cm exophytic right upper pole hypodensity (7:157), increased in size from 1.8 cm on 11/18/2021. Simple-appearing cyst in the lower pole left kidney and subcentimeter hyperdense cyst in the upper pole. No specific follow-up imaging recommended. No focal bladder wall thickening. Stomach/Bowel: Normal appearance of the stomach. No evidence of bowel wall thickening, distention, or inflammatory changes. Colonic diverticulosis without acute diverticulitis. Normal appendix. Lymphatic: No enlarged abdominal or pelvic lymph nodes. Reproductive: No adnexal masses. Multifocal coarse calcifications within the uterus, likely leiomyomata. Other: Small volume free fluid.  No free air or fluid collection. Musculoskeletal: No acute or abnormal lytic or blastic osseous lesions. Multilevel degenerative changes of the lumbar spine. Postsurgical changes of L4-5 superior spinous process and L2-3 fusion. Unchanged fat containing left lumbar hernias. Mild body wall edema. Review of the MIP images confirms the above findings.  IMPRESSION: 1. No evidence of aortic dissection. Similar postsurgical changes of the ascending aorta. 2. Similar multichamber cardiomegaly with asymmetric right pulmonary edema and unchanged moderate right and trace left pleural effusions. 3. Patchy right lower lobe ground-glass opacities, which may be superimposed infection or inflammation. 4. Unchanged mildly enlarged right hilar and subcarinal lymph nodes, likely reactive. 5. Increased size of a 2.6 cm exophytic right upper pole renal hypodensity. Recommend further nonemergent characterization. While renal protocol MRI or CT are preferred, in the setting of renal failure, renal ultrasound may be pursued first. 6. Aortic Atherosclerosis (ICD10-I70.0). Coronary artery calcifications. Assessment for potential risk factor modification, dietary therapy or pharmacologic therapy may be warranted, if clinically indicated. Electronically Signed   By: Agustin Cree M.D.   On: 08/21/2023 10:42    apixaban  2.5 mg Oral Q12H   carvedilol  25 mg Oral Q12H   cloNIDine  0.1 mg Oral TID   doxycycline  100 mg Oral Q12H   heparin sodium (porcine)  2,600 Units Intracatheter Q T,Th,Sa-HD   hydrALAZINE  100 mg Oral Q12H   rosuvastatin  10 mg Oral Daily   sevelamer carbonate  2.4 g Oral TID WC   sodium chloride flush  10-40 mL Intracatheter Q12H   sodium chloride flush  3 mL Intravenous Q12H   sodium chloride flush  3-10 mL Intravenous Q12H    BMET    Component Value Date/Time   NA 135 08/23/2023 0400   NA 140 05/13/2020 1108   NA 142 09/28/2012 0831  K 4.2 08/23/2023 0400   K 4.2 09/28/2012 0831   CL 96 (L) 08/23/2023 0400   CL 103 09/28/2012 0831   CO2 27 08/23/2023 0400   CO2 30 (H) 09/28/2012 0831   GLUCOSE 134 (H) 08/23/2023 0400   GLUCOSE 103 (H) 09/28/2012 0831   BUN 39 (H) 08/23/2023 0400   BUN 47 (H) 05/13/2020 1108   BUN 18.3 09/28/2012 0831   CREATININE 8.82 (H) 08/23/2023 0400   CREATININE 2.09 (H) 01/18/2017 1145   CREATININE 1.2 (H)  09/28/2012 0831   CALCIUM 8.3 (L) 08/23/2023 0400   CALCIUM 9.4 09/28/2012 0831   GFRNONAA 4 (L) 08/23/2023 0400   GFRNONAA 23 (L) 12/09/2016 1458   GFRAA 8 (L) 05/13/2020 1108   GFRAA 26 (L) 12/09/2016 1458   CBC    Component Value Date/Time   WBC 3.6 (L) 08/23/2023 0400   RBC 3.60 (L) 08/23/2023 0400   HGB 10.6 (L) 08/23/2023 0400   HGB 7.8 (L) 05/06/2022 1154   HGB 13.1 09/28/2012 0831   HCT 34.8 (L) 08/23/2023 0400   HCT 23.9 (L) 05/06/2022 1154   HCT 40.1 09/28/2012 0831   PLT 193 08/23/2023 0400   PLT 352 05/06/2022 1154   MCV 96.7 08/23/2023 0400   MCV 92 05/06/2022 1154   MCV 90.7 09/28/2012 0831   MCH 29.4 08/23/2023 0400   MCHC 30.5 08/23/2023 0400   RDW 18.5 (H) 08/23/2023 0400   RDW 14.3 05/06/2022 1154   RDW 14.2 09/28/2012 0831   LYMPHSABS 0.6 (L) 08/21/2023 0823   LYMPHSABS 1.0 05/13/2020 1108   LYMPHSABS 1.8 09/28/2012 0831   MONOABS 0.5 08/21/2023 0823   MONOABS 0.5 09/28/2012 0831   EOSABS 0.2 08/21/2023 0823   EOSABS 0.1 05/13/2020 1108   BASOSABS 0.1 08/21/2023 0823   BASOSABS 0.1 05/13/2020 1108   BASOSABS 0.0 09/28/2012 0831    OP HD: [facility] TTS 3.5h  400 BFR 2K bath TDC -Dry weight pending     Assessment/ Plan: Hypertensive urgency: sig better today, 160/90s.   Shortness of breath/ vol overload: mild pulmonary edema on CXR. 3 L UF w/ HD last night. Looks better. Would wean O2 down and if can off O2 ok to d/c from renal standpoint.  ESRD: on HD TTS. Had HD overnight. No further needs at this time.  Hyperkalemia: K+ 6.0, after HD is corrected.  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  She will follow up with her outpatient unit tomorrow for regularly scheduled treatment.     Irena Cords, MD [employer] [phone]

## 2023-08-24 LAB — HEPATITIS B SURFACE ANTIBODY, QUANTITATIVE: Hep B S AB Quant (Post): 42 m[IU]/mL

## 2023-08-25 ENCOUNTER — Ambulatory Visit

## 2023-08-26 ENCOUNTER — Ambulatory Visit

## 2023-08-31 ENCOUNTER — Encounter (HOSPITAL_COMMUNITY): Payer: Self-pay

## 2023-08-31 ENCOUNTER — Inpatient Hospital Stay (HOSPITAL_COMMUNITY)
Admission: EM | Admit: 2023-08-31 | Discharge: 2023-09-02 | DRG: 280 | Disposition: A | Discharge status: Home / Self Care | Attending: Internal Medicine | Admitting: Internal Medicine

## 2023-08-31 ENCOUNTER — Emergency Department (HOSPITAL_COMMUNITY)

## 2023-08-31 ENCOUNTER — Other Ambulatory Visit: Payer: Self-pay

## 2023-08-31 DIAGNOSIS — Z8673 Personal history of transient ischemic attack (TIA), and cerebral infarction without residual deficits: Secondary | ICD-10-CM

## 2023-08-31 DIAGNOSIS — Z981 Arthrodesis status: Secondary | ICD-10-CM

## 2023-08-31 DIAGNOSIS — Z800 Family history of malignant neoplasm of digestive organs: Secondary | ICD-10-CM

## 2023-08-31 DIAGNOSIS — K219 Gastro-esophageal reflux disease without esophagitis: Secondary | ICD-10-CM | POA: Diagnosis present

## 2023-08-31 DIAGNOSIS — G8929 Other chronic pain: Secondary | ICD-10-CM | POA: Diagnosis present

## 2023-08-31 DIAGNOSIS — E875 Hyperkalemia: Secondary | ICD-10-CM | POA: Diagnosis not present

## 2023-08-31 DIAGNOSIS — Z66 Do not resuscitate: Secondary | ICD-10-CM | POA: Diagnosis present

## 2023-08-31 DIAGNOSIS — E873 Alkalosis: Secondary | ICD-10-CM | POA: Diagnosis present

## 2023-08-31 DIAGNOSIS — Z91148 Patient's other noncompliance with medication regimen for other reason: Secondary | ICD-10-CM

## 2023-08-31 DIAGNOSIS — Z992 Dependence on renal dialysis: Secondary | ICD-10-CM

## 2023-08-31 DIAGNOSIS — R0602 Shortness of breath: Secondary | ICD-10-CM | POA: Diagnosis not present

## 2023-08-31 DIAGNOSIS — I471 Supraventricular tachycardia, unspecified: Secondary | ICD-10-CM | POA: Diagnosis not present

## 2023-08-31 DIAGNOSIS — I5043 Acute on chronic combined systolic (congestive) and diastolic (congestive) heart failure: Secondary | ICD-10-CM | POA: Diagnosis present

## 2023-08-31 DIAGNOSIS — Z91040 Latex allergy status: Secondary | ICD-10-CM

## 2023-08-31 DIAGNOSIS — R4189 Other symptoms and signs involving cognitive functions and awareness: Secondary | ICD-10-CM | POA: Diagnosis present

## 2023-08-31 DIAGNOSIS — I5023 Acute on chronic systolic (congestive) heart failure: Secondary | ICD-10-CM

## 2023-08-31 DIAGNOSIS — Z91013 Allergy to seafood: Secondary | ICD-10-CM

## 2023-08-31 DIAGNOSIS — Z681 Body mass index (BMI) 19 or less, adult: Secondary | ICD-10-CM

## 2023-08-31 DIAGNOSIS — Z1152 Encounter for screening for COVID-19: Secondary | ICD-10-CM

## 2023-08-31 DIAGNOSIS — J449 Chronic obstructive pulmonary disease, unspecified: Secondary | ICD-10-CM | POA: Diagnosis present

## 2023-08-31 DIAGNOSIS — D631 Anemia in chronic kidney disease: Secondary | ICD-10-CM | POA: Diagnosis present

## 2023-08-31 DIAGNOSIS — Z5320 Procedure and treatment not carried out because of patient's decision for unspecified reasons: Secondary | ICD-10-CM | POA: Diagnosis present

## 2023-08-31 DIAGNOSIS — I5082 Biventricular heart failure: Secondary | ICD-10-CM | POA: Diagnosis present

## 2023-08-31 DIAGNOSIS — E785 Hyperlipidemia, unspecified: Secondary | ICD-10-CM | POA: Diagnosis present

## 2023-08-31 DIAGNOSIS — Z87442 Personal history of urinary calculi: Secondary | ICD-10-CM

## 2023-08-31 DIAGNOSIS — Z833 Family history of diabetes mellitus: Secondary | ICD-10-CM

## 2023-08-31 DIAGNOSIS — I509 Heart failure, unspecified: Secondary | ICD-10-CM

## 2023-08-31 DIAGNOSIS — I480 Paroxysmal atrial fibrillation: Secondary | ICD-10-CM | POA: Diagnosis present

## 2023-08-31 DIAGNOSIS — Z8619 Personal history of other infectious and parasitic diseases: Secondary | ICD-10-CM

## 2023-08-31 DIAGNOSIS — E440 Moderate protein-calorie malnutrition: Secondary | ICD-10-CM | POA: Diagnosis present

## 2023-08-31 DIAGNOSIS — I132 Hypertensive heart and chronic kidney disease with heart failure and with stage 5 chronic kidney disease, or end stage renal disease: Principal | ICD-10-CM | POA: Diagnosis present

## 2023-08-31 DIAGNOSIS — J810 Acute pulmonary edema: Secondary | ICD-10-CM

## 2023-08-31 DIAGNOSIS — I21A1 Myocardial infarction type 2: Secondary | ICD-10-CM | POA: Diagnosis present

## 2023-08-31 DIAGNOSIS — N2581 Secondary hyperparathyroidism of renal origin: Secondary | ICD-10-CM | POA: Diagnosis present

## 2023-08-31 DIAGNOSIS — M1990 Unspecified osteoarthritis, unspecified site: Secondary | ICD-10-CM | POA: Diagnosis present

## 2023-08-31 DIAGNOSIS — J9601 Acute respiratory failure with hypoxia: Secondary | ICD-10-CM | POA: Diagnosis not present

## 2023-08-31 DIAGNOSIS — I161 Hypertensive emergency: Secondary | ICD-10-CM | POA: Diagnosis not present

## 2023-08-31 DIAGNOSIS — Z885 Allergy status to narcotic agent status: Secondary | ICD-10-CM

## 2023-08-31 DIAGNOSIS — Z86000 Personal history of in-situ neoplasm of breast: Secondary | ICD-10-CM

## 2023-08-31 DIAGNOSIS — R739 Hyperglycemia, unspecified: Secondary | ICD-10-CM | POA: Diagnosis not present

## 2023-08-31 DIAGNOSIS — Z8249 Family history of ischemic heart disease and other diseases of the circulatory system: Secondary | ICD-10-CM

## 2023-08-31 DIAGNOSIS — Z87891 Personal history of nicotine dependence: Secondary | ICD-10-CM

## 2023-08-31 DIAGNOSIS — I700 Atherosclerosis of aorta: Secondary | ICD-10-CM | POA: Diagnosis present

## 2023-08-31 DIAGNOSIS — Z888 Allergy status to other drugs, medicaments and biological substances status: Secondary | ICD-10-CM

## 2023-08-31 DIAGNOSIS — Z79899 Other long term (current) drug therapy: Secondary | ICD-10-CM

## 2023-08-31 DIAGNOSIS — Z886 Allergy status to analgesic agent status: Secondary | ICD-10-CM

## 2023-08-31 DIAGNOSIS — Z881 Allergy status to other antibiotic agents status: Secondary | ICD-10-CM

## 2023-08-31 DIAGNOSIS — Z7901 Long term (current) use of anticoagulants: Secondary | ICD-10-CM

## 2023-08-31 DIAGNOSIS — N186 End stage renal disease: Secondary | ICD-10-CM | POA: Diagnosis present

## 2023-08-31 DIAGNOSIS — Z820 Family history of epilepsy and other diseases of the nervous system: Secondary | ICD-10-CM

## 2023-08-31 LAB — RESP PANEL BY RT-PCR (RSV, FLU A&B, COVID)  RVPGX2
Influenza A by PCR: NEGATIVE
Influenza B by PCR: NEGATIVE
Resp Syncytial Virus by PCR: NEGATIVE
SARS Coronavirus 2 by RT PCR: NEGATIVE

## 2023-08-31 LAB — CBC WITH DIFFERENTIAL/PLATELET
Abs Immature Granulocytes: 0.03 10*3/uL (ref 0.00–0.07)
Basophils Absolute: 0.1 10*3/uL (ref 0.0–0.1)
Basophils Relative: 1 %
Eosinophils Absolute: 0.2 10*3/uL (ref 0.0–0.5)
Eosinophils Relative: 3 %
HCT: 43.7 % (ref 36.0–46.0)
Hemoglobin: 13.4 g/dL (ref 12.0–15.0)
Immature Granulocytes: 0 %
Lymphocytes Relative: 8 %
Lymphs Abs: 0.6 10*3/uL — ABNORMAL LOW (ref 0.7–4.0)
MCH: 29.6 pg (ref 26.0–34.0)
MCHC: 30.7 g/dL (ref 30.0–36.0)
MCV: 96.7 fL (ref 80.0–100.0)
Monocytes Absolute: 0.7 10*3/uL (ref 0.1–1.0)
Monocytes Relative: 9 %
Neutro Abs: 5.8 10*3/uL (ref 1.7–7.7)
Neutrophils Relative %: 79 %
Platelets: UNDETERMINED 10*3/uL (ref 150–400)
RBC: 4.52 MIL/uL (ref 3.87–5.11)
RDW: 19.7 % — ABNORMAL HIGH (ref 11.5–15.5)
Smear Review: NORMAL
WBC: 7.3 10*3/uL (ref 4.0–10.5)
nRBC: 0 % (ref 0.0–0.2)

## 2023-08-31 LAB — I-STAT VENOUS BLOOD GAS, ED
Acid-Base Excess: 6 mmol/L — ABNORMAL HIGH (ref 0.0–2.0)
Bicarbonate: 28.4 mmol/L — ABNORMAL HIGH (ref 20.0–28.0)
Calcium, Ion: 0.94 mmol/L — ABNORMAL LOW (ref 1.15–1.40)
HCT: 45 % (ref 36.0–46.0)
Hemoglobin: 15.3 g/dL — ABNORMAL HIGH (ref 12.0–15.0)
O2 Saturation: 94 %
Potassium: 6.1 mmol/L — ABNORMAL HIGH (ref 3.5–5.1)
Sodium: 136 mmol/L (ref 135–145)
TCO2: 29 mmol/L (ref 22–32)
pCO2, Ven: 32.6 mmHg — ABNORMAL LOW (ref 44–60)
pH, Ven: 7.548 — ABNORMAL HIGH (ref 7.25–7.43)
pO2, Ven: 60 mmHg — ABNORMAL HIGH (ref 32–45)

## 2023-08-31 LAB — BLOOD GAS, VENOUS
Acid-Base Excess: 6.9 mmol/L — ABNORMAL HIGH (ref 0.0–2.0)
Bicarbonate: 32.5 mmol/L — ABNORMAL HIGH (ref 20.0–28.0)
Drawn by: 70243
O2 Saturation: 96.8 %
Patient temperature: 37
pCO2, Ven: 49 mmHg (ref 44–60)
pH, Ven: 7.43 (ref 7.25–7.43)
pO2, Ven: 76 mmHg — ABNORMAL HIGH (ref 32–45)

## 2023-08-31 LAB — COMPREHENSIVE METABOLIC PANEL WITH GFR
ALT: 10 U/L (ref 0–44)
AST: 24 U/L (ref 15–41)
Albumin: 4.2 g/dL (ref 3.5–5.0)
Alkaline Phosphatase: 73 U/L (ref 38–126)
Anion gap: 17 — ABNORMAL HIGH (ref 5–15)
BUN: 43 mg/dL — ABNORMAL HIGH (ref 8–23)
CO2: 26 mmol/L (ref 22–32)
Calcium: 9.9 mg/dL (ref 8.9–10.3)
Chloride: 97 mmol/L — ABNORMAL LOW (ref 98–111)
Creatinine, Ser: 10.46 mg/dL — ABNORMAL HIGH (ref 0.44–1.00)
GFR, Estimated: 4 mL/min — ABNORMAL LOW (ref >60)
Glucose, Bld: 126 mg/dL — ABNORMAL HIGH (ref 70–99)
Potassium: 6.4 mmol/L (ref 3.5–5.1)
Sodium: 140 mmol/L (ref 135–145)
Total Bilirubin: 0.7 mg/dL (ref 0.0–1.2)
Total Protein: 7.9 g/dL (ref 6.5–8.1)

## 2023-08-31 LAB — I-STAT CHEM 8, ED
BUN: 50 mg/dL — ABNORMAL HIGH (ref 8–23)
Calcium, Ion: 0.92 mmol/L — ABNORMAL LOW (ref 1.15–1.40)
Chloride: 104 mmol/L (ref 98–111)
Creatinine, Ser: 11.5 mg/dL — ABNORMAL HIGH (ref 0.44–1.00)
Glucose, Bld: 124 mg/dL — ABNORMAL HIGH (ref 70–99)
HCT: 46 % (ref 36.0–46.0)
Hemoglobin: 15.6 g/dL — ABNORMAL HIGH (ref 12.0–15.0)
Potassium: 6.2 mmol/L — ABNORMAL HIGH (ref 3.5–5.1)
Sodium: 136 mmol/L (ref 135–145)
TCO2: 27 mmol/L (ref 22–32)

## 2023-08-31 LAB — TROPONIN I (HIGH SENSITIVITY)
Troponin I (High Sensitivity): 65 ng/L — ABNORMAL HIGH (ref <18)
Troponin I (High Sensitivity): 60 ng/L — ABNORMAL HIGH (ref <18)

## 2023-08-31 LAB — MRSA NEXT GEN BY PCR, NASAL: MRSA by PCR Next Gen: NOT DETECTED

## 2023-08-31 LAB — BRAIN NATRIURETIC PEPTIDE: B Natriuretic Peptide: >4500 pg/mL — ABNORMAL HIGH (ref 0.0–100.0)

## 2023-08-31 MED ORDER — DEXTROSE 50 % IV SOLN
1.0000 | Freq: Once | INTRAVENOUS | Status: AC
  Administered 2023-08-31: 50 mL via INTRAVENOUS
  Filled 2023-08-31: qty 50

## 2023-08-31 MED ORDER — NITROGLYCERIN 0.4 MG SL SUBL
SUBLINGUAL_TABLET | SUBLINGUAL | Status: AC
  Administered 2023-08-31: 0.4 mg
  Filled 2023-08-31: qty 1

## 2023-08-31 MED ORDER — ONDANSETRON HCL 4 MG/2ML IJ SOLN
4.0000 mg | Freq: Once | INTRAMUSCULAR | Status: AC
  Administered 2023-08-31: 4 mg via INTRAVENOUS
  Filled 2023-08-31: qty 2

## 2023-08-31 MED ORDER — OXYCODONE-ACETAMINOPHEN 10-325 MG PO TABS: 1.0000 | ORAL_TABLET | Freq: Four times a day (QID) | ORAL | Status: DC | PRN

## 2023-08-31 MED ORDER — HYDRALAZINE HCL 50 MG PO TABS
100.0000 mg | ORAL_TABLET | Freq: Three times a day (TID) | ORAL | Status: DC
  Administered 2023-08-31 – 2023-09-02 (×7): 100 mg via ORAL
  Filled 2023-08-31: qty 2
  Filled 2023-08-31: qty 4
  Filled 2023-08-31 (×5): qty 2

## 2023-08-31 MED ORDER — CARVEDILOL 25 MG PO TABS
25.0000 mg | ORAL_TABLET | Freq: Two times a day (BID) | ORAL | Status: DC
  Administered 2023-08-31 – 2023-09-02 (×4): 25 mg via ORAL
  Filled 2023-08-31: qty 2
  Filled 2023-08-31 (×4): qty 1

## 2023-08-31 MED ORDER — NITROGLYCERIN IN D5W 200-5 MCG/ML-% IV SOLN
10.0000 ug/min | INTRAVENOUS | Status: DC
Start: 2023-08-31 — End: 2023-09-01
  Administered 2023-08-31: 5 ug/min via INTRAVENOUS
  Filled 2023-08-31: qty 250

## 2023-08-31 MED ORDER — INSULIN ASPART 100 UNIT/ML IV SOLN
5.0000 [IU] | Freq: Once | INTRAVENOUS | Status: AC
  Administered 2023-08-31: 5 [IU] via INTRAVENOUS

## 2023-08-31 MED ORDER — OXYCODONE-ACETAMINOPHEN 5-325 MG PO TABS
1.0000 | ORAL_TABLET | Freq: Four times a day (QID) | ORAL | Status: DC | PRN
  Administered 2023-08-31 – 2023-09-02 (×4): 1 via ORAL
  Filled 2023-08-31 (×4): qty 1

## 2023-08-31 MED ORDER — CALCIUM GLUCONATE-NACL 1-0.675 GM/50ML-% IV SOLN
1.0000 g | Freq: Once | INTRAVENOUS | Status: AC
  Administered 2023-08-31: 1000 mg via INTRAVENOUS
  Filled 2023-08-31: qty 50

## 2023-08-31 MED ORDER — ROSUVASTATIN CALCIUM 5 MG PO TABS
10.0000 mg | ORAL_TABLET | Freq: Every day | ORAL | Status: DC
Start: 2023-08-31 — End: 2023-09-02
  Administered 2023-08-31 – 2023-09-02 (×3): 10 mg via ORAL
  Filled 2023-08-31 (×3): qty 2

## 2023-08-31 MED ORDER — HYDRALAZINE HCL 20 MG/ML IJ SOLN
10.0000 mg | Freq: Four times a day (QID) | INTRAMUSCULAR | Status: DC | PRN
  Administered 2023-09-01 – 2023-09-02 (×5): 10 mg via INTRAVENOUS
  Filled 2023-08-31 (×5): qty 1

## 2023-08-31 MED ORDER — SEVELAMER CARBONATE 2.4 G PO PACK
2.4000 g | PACK | Freq: Three times a day (TID) | ORAL | Status: DC
  Administered 2023-09-01 – 2023-09-02 (×4): 2.4 g via ORAL
  Filled 2023-08-31 (×7): qty 1

## 2023-08-31 MED ORDER — ISOSORBIDE MONONITRATE ER 30 MG PO TB24: 30.0000 mg | ORAL_TABLET | Freq: Every day | ORAL | Status: DC

## 2023-08-31 MED ORDER — ALBUTEROL SULFATE (2.5 MG/3ML) 0.083% IN NEBU: 2.5000 mg | INHALATION_SOLUTION | Freq: Four times a day (QID) | RESPIRATORY_TRACT | Status: DC | PRN

## 2023-08-31 MED ORDER — CARVEDILOL 12.5 MG PO TABS
25.0000 mg | ORAL_TABLET | Freq: Two times a day (BID) | ORAL | Status: DC
  Administered 2023-08-31: 25 mg via ORAL
  Filled 2023-08-31: qty 2

## 2023-08-31 MED ORDER — HEPARIN SODIUM (PORCINE) 1000 UNIT/ML IJ SOLN
INTRAMUSCULAR | Status: AC
  Administered 2023-08-31: 3800 [IU]
  Filled 2023-08-31: qty 4

## 2023-08-31 MED ORDER — PANTOPRAZOLE SODIUM 40 MG PO TBEC
40.0000 mg | DELAYED_RELEASE_TABLET | Freq: Two times a day (BID) | ORAL | Status: DC
  Administered 2023-08-31 – 2023-09-02 (×4): 40 mg via ORAL
  Filled 2023-08-31 (×4): qty 1

## 2023-08-31 MED ORDER — SODIUM ZIRCONIUM CYCLOSILICATE 10 G PO PACK
10.0000 g | PACK | Freq: Once | ORAL | Status: AC
  Administered 2023-08-31: 10 g via ORAL
  Filled 2023-08-31: qty 1

## 2023-08-31 MED ORDER — SENNOSIDES-DOCUSATE SODIUM 8.6-50 MG PO TABS
1.0000 | ORAL_TABLET | Freq: Every day | ORAL | Status: DC
  Administered 2023-08-31 – 2023-09-01 (×2): 1 via ORAL
  Filled 2023-08-31 (×2): qty 1

## 2023-08-31 MED ORDER — OXYCODONE HCL 5 MG PO TABS
5.0000 mg | ORAL_TABLET | Freq: Four times a day (QID) | ORAL | Status: DC | PRN
  Administered 2023-08-31 – 2023-09-02 (×4): 5 mg via ORAL
  Filled 2023-08-31 (×4): qty 1

## 2023-08-31 MED ORDER — FENTANYL CITRATE PF 50 MCG/ML IJ SOSY
50.0000 ug | PREFILLED_SYRINGE | Freq: Once | INTRAMUSCULAR | Status: AC
  Administered 2023-08-31: 50 ug via INTRAVENOUS
  Filled 2023-08-31: qty 1

## 2023-08-31 MED ORDER — APIXABAN 2.5 MG PO TABS
2.5000 mg | ORAL_TABLET | Freq: Two times a day (BID) | ORAL | Status: DC
  Administered 2023-08-31 – 2023-09-02 (×4): 2.5 mg via ORAL
  Filled 2023-08-31 (×4): qty 1

## 2023-08-31 MED ORDER — AMLODIPINE BESYLATE 10 MG PO TABS
10.0000 mg | ORAL_TABLET | Freq: Every day | ORAL | Status: DC
  Administered 2023-08-31 – 2023-09-02 (×3): 10 mg via ORAL
  Filled 2023-08-31 (×3): qty 1

## 2023-08-31 NOTE — ED Triage Notes (Signed)
 PT BIB [facility] from home after onset this morning of SOB, PT has scheduled dialysis today, and reports she has not missed any previous appointments. [facility] vitals BP 220/130, HR 110, 98% on RA, PT has slurred speech, EMS unsure if this baseline. PT unable to answer Q's due to SOB.

## 2023-08-31 NOTE — Progress Notes (Signed)
   08/31/23 1445  Vitals  Pulse Rate 72  Resp (!) 22  BP (!) 196/177 (rechecking bp)  SpO2 100 %  During Treatment Monitoring  Blood Flow Rate (mL/min) 349 mL/min  Arterial Pressure (mmHg) -161.41 mmHg  Venous Pressure (mmHg) 179.18 mmHg  TMP (mmHg) 10.5 mmHg  Ultrafiltration Rate (mL/min) 982 mL/min  Dialysate Flow Rate (mL/min) 299 ml/min  Dialysate Potassium Concentration 2  Dialysate Calcium Concentration 2.5  Duration of HD Treatment -hour(s) 1.34 hour(s)  Cumulative Fluid Removed (mL) per Treatment  877.65   Pt had 3 runs of ST into 140's lasting less than 30 secs. Notified MD [place]. Called PA-Collins to bedside. Collins ordered carvedilol 25mg  PO.

## 2023-08-31 NOTE — Progress Notes (Signed)
   08/31/23 1700  Vitals  Temp 98.9 F (37.2 C)  Pulse Rate 78  Resp (!) 24  BP (!) 202/112  SpO2 100 %  O2 Device Nasal Cannula  Weight  (pt on stretcher, unable to obtain.)  Oxygen Therapy  O2 Flow Rate (L/min) 4 L/min  Patient Activity (if Appropriate) In bed  Pulse Oximetry Type Continuous  Oximetry Probe Site Changed No  Post Treatment  Dialyzer Clearance Lightly streaked  Liters Processed 69.5  Fluid Removed (mL) 2800 mL  Tolerated HD Treatment Yes  Post-Hemodialysis Comments Blood pressure elevated, pt on ntg gtt and PO medication given. MD/PA aware.   Received patient in bed to unit.  Alert and oriented.  Informed consent signed and in chart.   TX duration: Three hours and twenty-four minutes  Patient tolerated well.  Transported back to the room  Alert, without acute distress.  Hand-off given to patient's nurse.   Access used: Right upper HD chest catheter Access issues: none  Medication(s) given: 25mg  carvedilol

## 2023-08-31 NOTE — Progress Notes (Signed)
 [facility] Brief Note  Notified by HD RN that patient was having brief runs of SVT lasting less than 30 seconds. On nitroglycerin drip. BP still very high, 199/109 after 1.5L UF so far. Pt asymptomatic, breathing improved compared to this AM. Denies HA, blurry vision, CP, nausea. Discussed with Dr. Arlean Hopping. Will give her next carvedilol dose early (last dose was this AM).   Rogers Blocker, PA-C 08/31/2023, 3:29 PM  [facility] Pager: [phone]

## 2023-08-31 NOTE — ED Provider Notes (Signed)
 [place] EMERGENCY DEPARTMENT AT [facility] Provider Note   CSN: [id number] Arrival date & time: 08/31/23  2841     History  Chief Complaint  Patient presents with   Shortness of Breath    Monique Henderson is a 73 y.o. female.  HPI     73 year old female with a history of ESRD on dialysis Tuesday Thursday Saturday, atrial fibrillation on Eliquis, hypertension, hyperlipidemia, COPD, breast cancer status postlumpectomy in 2014, GERD, CVA with right-sided deficit, chronic lower back pain status post lumbar spinal fusion in 2010, moderate protein calorie malnutrition, hepatitis C treated in 2018 send admission March 22 to March 24 with hypertensive urgency, acute hypoxic respiratory failure, paroxysmal atrial fibrillation who presents with concern for shortness of breath.  She developed shortness of breath last night which increased this morning.  She has not missed any dialysis and is due for dialysis today.  Due to her significant shortness of breath she called EMS.  She also has had a cough productive of white sputum.  Denies any fever or chills.  Does report some mild chest discomfort and association with her shortness of breath.  No leg pain or swelling.  She has not yet taken her blood pressure medications today.  Past Medical History:  Diagnosis Date   Acute cardioembolic stroke (HCC) 02/26/2022   AF (paroxysmal atrial fibrillation) (HCC) 05/29/2019   on Coumadin   Aortic atherosclerosis (HCC) 07/05/2019   Aortic dissection (HCC) 04/04/2019   s/p repair   Bone spur 2008   Right calcaneal foot spur   Breast cancer (HCC) 2004   Ductal carcinoma in situ of the left breast; S/P left partial mastectomy 02/26/2003; S/P re-excision of cranial and lateral margins11/18/2004.radiation   Carotid artery disease (HCC)    Cerebral thrombosis with cerebral infarction 05/22/2019   Chronic HFrEF (heart failure with reduced ejection fraction) (HCC)    Chronic low back pain  06/22/2016   Chronic obstructive lung disease (HCC) 01/16/2017   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 Chevis Pretty; S/P re-excision of superior and inferior margins 10/27/2012.    Dissection of aorta (HCC) 04/03/2019   ESRD on hemodialysis (HCC) 05/29/2019   Essential hypertension 09/16/2006   GERD 09/16/2006   Hepatitis C    treated and RNA confirmed not detectable 01/2017   Insomnia 03/14/2015   Malnutrition of moderate degree 05/19/2019   Non compliance with medical treatment 12/04/2017   Normocytic anemia    With thrombocytosis   Osteoarthritis    Right ureteral stone 2002   S/P lumbar spinal fusion 01/18/2014   S/P lumbar decompressive laminectomy, fusion, and plating for lumbar spinal stenosis on 05/27/2009 by Dr. Tia Alert.  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 Tia Alert.     Stroke (cerebrum) [facility])    Stroke (HCC) 02/23/2022   SVT (supraventricular tachycardia) (HCC) 08/28/2019   Tobacco use disorder 04/19/2009   Uterine fibroid    Wears dentures    top     Home Medications Prior to Admission medications   Medication Sig Start Date End Date Taking? Authorizing Provider  albuterol (PROVENTIL) (2.5 MG/3ML) 0.083% nebulizer solution Take 3 mLs (2.5 mg total) by nebulization every 6 (six) hours as needed for wheezing or shortness of breath. 08/23/23   Pokhrel,  Laxman, MD  albuterol (VENTOLIN HFA) 108 (90 Base) MCG/ACT inhaler Inhale 2 puffs into the lungs every 6 (six) hours as needed for wheezing or shortness of breath. 08/23/23   Pokhrel, Laxman, MD  amLODipine (NORVASC) 10 MG tablet Take 1 tablet (10 mg total) by mouth daily. Patient taking differently: Take 10 mg by mouth in the morning. 03/06/22   Setzer, Lynnell Jude, PA-C  apixaban (ELIQUIS) 2.5 MG TABS tablet Take 1 tablet (2.5 mg total) by mouth 2 (two) times daily. 03/06/22   Setzer, Lynnell Jude, PA-C  carvedilol (COREG) 25 MG tablet Take 1 tablet (25 mg total) by mouth 2 (two) times daily with a meal. 05/01/22 06/16/24  Ghimire, Werner Lean, MD  Cholecalciferol (VITAMIN D-3 PO) Take 1 capsule by mouth in the morning.    [provider]  cloNIDine (CATAPRES) 0.1 MG tablet Take 1 tablet (0.1 mg total) by mouth 3 (three) times daily. 06/19/23   Hughie Closs, MD  hydrALAZINE (APRESOLINE) 100 MG tablet Take 1 tablet (100 mg total) by mouth every 8 (eight) hours. Patient taking differently: Take 100 mg by mouth 2 (two) times daily. 12/18/22   Corky Crafts, MD  isosorbide mononitrate (IMDUR) 30 MG 24 hr tablet TAKE 1 TABLET (30MG ) ON DIALYSIS DAYS AND 2 TABLETS (60 MG) ON NON DIALYSIS DAYS. 01/12/23   Corky Crafts, MD  LOKELMA 10 g PACK packet Take 10 g by mouth every Monday, Wednesday, and Friday. 07/30/23   [provider]  Methoxy PEG-Epoetin Beta (MIRCERA IJ) every 14 (fourteen) days. 07/06/23 07/04/24  [provider]  naloxone [facility]) nasal spray 4 mg/0.1 mL Place 1 spray into the nose as needed (opioid overdose). 02/19/22   [provider]  oxyCODONE-acetaminophen (PERCOCET) 10-325 MG tablet Take 1 tablet by mouth in the morning and at bedtime. 03/12/22   [provider]  pantoprazole (PROTONIX) 40 MG tablet TAKE 1 TABLET (40 MG TOTAL) BY MOUTH 2 (TWO) TIMES DAILY. 05/13/23   Georganna Skeans, MD  rosuvastatin (CRESTOR) 10 MG tablet TAKE 1 TABLET (10 MG TOTAL) BY MOUTH DAILY FOR CHOLESTEROL Patient taking differently: Take 10 mg by mouth daily. 08/07/22   Georganna Skeans, MD  senna-docusate (SENOKOT-S) 8.6-50 MG tablet Take 1 tablet by mouth 2 (two) times daily between meals as needed for mild constipation. 08/14/22   Almon Hercules, MD  sevelamer carbonate (RENVELA) 2.4 g PACK Take 2.4 g by mouth 3 (three) times  daily with meals. 03/06/22   Setzer, Lynnell Jude, PA-C  traZODone (DESYREL) 50 MG tablet Take 25-50 mg by mouth at bedtime as needed for sleep. 03/19/23   [provider]      Allergies    Shrimp [shellfish allergy], Bactroban [mupirocin], Chlorhexidine gluconate, Hydrocodone, Latex, Tylenol [acetaminophen], and Zestril [lisinopril]    Review of Systems   Review of Systems  Physical Exam Updated Vital Signs BP (!) 211/131   Pulse (!) 105   Temp 97.6 F (36.4 C) (Oral)   Resp (!) 21   Ht 5\' 3"  (1.6 m)   Wt 49 kg   SpO2 94%   BMI 19.13 kg/m  Physical Exam Vitals and nursing note reviewed.  Constitutional:      General: She is in acute distress.     Appearance: She is well-developed. She is ill-appearing. She is not diaphoretic.  HENT:     Head: Normocephalic and atraumatic.  Eyes:     Conjunctiva/sclera: Conjunctivae normal.  Neck:     Vascular: JVD present.  Cardiovascular:     Rate and Rhythm: Normal rate and regular rhythm.     Heart sounds: Normal heart sounds. No murmur heard.    No friction rub. No gallop.  Pulmonary:     Effort: Tachypnea, accessory muscle usage and respiratory distress present.     Breath sounds: Decreased breath sounds (at bases) present. No wheezing or rales.  Abdominal:     General: There is no distension.     Palpations: Abdomen is soft.     Tenderness: There is no abdominal tenderness. There is no guarding.  Musculoskeletal:        General: No tenderness.     Cervical back: Normal range of motion.  Skin:    General: Skin is warm and dry.     Findings: No erythema or rash.  Neurological:     Mental Status: She is alert and oriented to person, place, and time.     ED Results / Procedures / Treatments   Labs (all labs ordered are listed, but only abnormal results are displayed) Labs Reviewed  COMPREHENSIVE METABOLIC PANEL WITH GFR - Abnormal; Notable for the following components:      Result Value   Potassium 6.4 (*)     Chloride 97 (*)    Glucose, Bld 126 (*)    BUN 43 (*)    Creatinine, Ser 10.46 (*)    GFR, Estimated 4 (*)    Anion gap 17 (*)    All other components within normal limits  BRAIN NATRIURETIC PEPTIDE - Abnormal; Notable for the following components:   B Natriuretic Peptide >4,500.0 (*)    All other components within normal limits  I-STAT CHEM 8, ED - Abnormal; Notable for the following components:   Potassium 6.2 (*)    BUN 50 (*)    Creatinine, Ser 11.50 (*)    Glucose, Bld 124 (*)    Calcium, Ion 0.92 (*)    Hemoglobin 15.6 (*)    All other components within normal limits  I-STAT VENOUS BLOOD GAS, ED - Abnormal; Notable for the following components:   pH, Ven 7.548 (*)    pCO2, Ven 32.6 (*)    pO2, Ven 60 (*)    Bicarbonate 28.4 (*)    Acid-Base Excess 6.0 (*)    Potassium 6.1 (*)    Calcium, Ion 0.94 (*)    Hemoglobin 15.3 (*)    All other components within normal limits  TROPONIN I (HIGH SENSITIVITY) - Abnormal; Notable for the following components:   Troponin I (High Sensitivity) 65 (*)    All other components within normal limits  RESP PANEL BY RT-PCR (RSV, FLU A&B, COVID)  [id number]  CBC WITH DIFFERENTIAL/PLATELET  TROPONIN I (HIGH SENSITIVITY)    EKG EKG Interpretation Date/Time:  Tuesday August 31 2023 07:27:35 EDT Ventricular Rate:  98 PR Interval:  172 QRS Duration:  148 QT Interval:  405 QTC Calculation: 518 R Axis:   63  Text Interpretation: Sinus rhythm Biatrial enlargement IVCD, consider atypical RBBB No significant change since last tracing Confirmed by Alvira Monday ([id number]) on 08/31/2023 8:52:05 AM  Radiology No results found.  Procedures .Critical Care  Performed by: Alvira Monday, MD Authorized by: Alvira Monday, MD   Critical care provider statement:    Critical care time (minutes):  30   Critical care was time spent personally by me on the following activities:  Development of treatment plan with patient or surrogate, discussions with  consultants, evaluation of patient's response to treatment, examination  of patient, ordering and review of laboratory studies, ordering and review of radiographic studies, ordering and performing treatments and interventions, pulse oximetry, re-evaluation of patient's condition and review of old charts     Medications Ordered in ED Medications  nitroGLYCERIN 50 mg in dextrose 5 % 250 mL (0.2 mg/mL) infusion (5 mcg/min Intravenous New Bag/Given 08/31/23 0832)  hydrALAZINE (APRESOLINE) tablet 100 mg (100 mg Oral Given 08/31/23 0737)  carvedilol (COREG) tablet 25 mg (25 mg Oral Given 08/31/23 0738)  sodium zirconium cyclosilicate (LOKELMA) packet 10 g (has no administration in time range)  calcium gluconate 1 g/ 50 mL sodium chloride IVPB (has no administration in time range)  insulin aspart (novoLOG) injection 5 Units (has no administration in time range)    And  dextrose 50 % solution 50 mL (has no administration in time range)  fentaNYL (SUBLIMAZE) injection 50 mcg (has no administration in time range)  ondansetron (ZOFRAN) injection 4 mg (has no administration in time range)  nitroGLYCERIN (NITROSTAT) 0.4 MG SL tablet (0.4 mg  Given 08/31/23 0981)    ED Course/ Medical Decision Making/ A&P                                   73 year old female with a history of ESRD on dialysis Tuesday Thursday Saturday, atrial fibrillation on Eliquis, hypertension, hyperlipidemia, COPD, breast cancer status postlumpectomy in 2014, GERD, CVA with right-sided deficit, chronic lower back pain status post lumbar spinal fusion in 2010, moderate protein calorie malnutrition, hepatitis C treated in 2018 send admission March 22 to March 24 with hypertensive urgency, acute hypoxic respiratory failure, paroxysmal atrial fibrillation who presents with concern for shortness of breath.  Differential diagnosis for dyspnea includes ACS, PE, COPD exacerbation, CHF exacerbation, anemia, pneumonia, viral etiology such as COVID 19  infection, metabolic abnormality.    Chest x-ray was done which showed cardiomegaly, asymmetric edema.   EKG was evaluated by me which showed sinus tachycardia with similar to prior.   History, exam most consistent with dyspnea secondary to hypertensive emergency and volume overload.  Labs obtained and personally evaluated interpreted by me are significant for hyperkalemia with a potassium of 6.4, BUN of 43, anion gap of 17.  Her BNP is elevated to greater than 4500 which has been the case in the past.  Her troponin is similar to prior values and is 65, with prior of 70.  For her hyperkalemia, ordered insulin, dextrose, Lokelma, and calcium.  IV team was consulted to establish an IV.  For her hypertensive emergency, she was given both her home medications and started on a nitroglycerin drip.  She initially declined BiPAP and was placed on 2 L of oxygen for oxygen saturations of 90% and increased work of breathing and placed on the nitro drip, however her work of breathing continued to be significant and she was convinced to start BiPAP.  Consulted with Dr. Arlean Hopping of nephrology who will evaluate the patient.  Will admit to the hospitalist service for hypertensive emergency, volume overload and need for emergent dialysis.        Final Clinical Impression(s) / ED Diagnoses Final diagnoses:  Hypertensive emergency  Hyperkalemia  Acute pulmonary edema [facility])    Rx / DC Orders ED Discharge Orders     None         Alvira Monday, MD 08/31/23 1122

## 2023-08-31 NOTE — Progress Notes (Signed)
 Per order, RT placed pt on BiPAP (servo). Pt tolerating well at this time. RT will continue to monitor pt.

## 2023-08-31 NOTE — H&P (Addendum)
 History and Physical    Patient: Monique Henderson:[id number] DOB: 1950/10/08 DOA: 08/31/2023 DOS: the patient was seen and examined on 08/31/2023 PCP: Fleet Contras, MD  Patient coming from: Home  Chief Complaint:  Chief Complaint  Patient presents with   Shortness of Breath   HPI: Monique Henderson is a 73 y.o. female with medical history significant of ESRD on HD TTS, HFrEF, anemia of ESRD, paroxysmal A fib, hx of CVA, COPD, HTN, HLD, s/p aortic aneurysm repair (2020) presenting to the ED with worsening SHOB.  Patient reports that she began having shortness of breath last night and this has progressively worsened overnight and into this morning.  She has not missed any of her dialysis sessions and is due for dialysis today.  However, given the extent of shortness of breath, she called EMS to bring her to the hospital instead.  She does note a mild cough productive of whitish sputum.  She also notes mild chest discomfort in association with her shortness of breath.  Otherwise denies any fevers, chills, palpitations, abdominal pain.  She has not yet taken any of her home blood pressure medications.  ED course: Vital signs notable for mild tachycardia, tachypnea, blood pressure 211/131.  She was initially on 2 L [place] but given increased work of breathing, transition to BiPAP.  Respiratory panel negative for COVID, flu, RSV.  CBC unremarkable.  CMP with potassium 6.4, kidney function in line with ESRD.  BNP greater than 4500.  Initial troponin 65, awaiting on repeat.  VBG showing pH 7.55, pCO2 32.6, bicarbonate 28.4 consistent with respiratory alkalosis with mild metabolic compensation.  Chest x-ray showing pulmonary edema.  Patient started on IV nitroglycerin, Lokelma, calcium gluconate.  Nephrology consulted by ED provider.  [facility] hospitalist asked to evaluate patient for admission.  Review of Systems: As mentioned in the history of present illness. All other systems reviewed and are  negative. 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 Chevis Pretty; S/P re-excision of superior and inferior margins 10/27/2012.    Dissection of aorta (HCC) 04/03/2019   ESRD on hemodialysis (HCC) 05/29/2019   Essential hypertension 09/16/2006   GERD 09/16/2006   Hepatitis C    treated and RNA confirmed not detectable 01/2017   Insomnia 03/14/2015   Malnutrition of moderate degree 05/19/2019   Non compliance with medical treatment 12/04/2017   Normocytic anemia    With thrombocytosis   Osteoarthritis    Right ureteral stone 2002   S/P lumbar spinal fusion 01/18/2014   S/P lumbar decompressive laminectomy, fusion, and plating for lumbar spinal stenosis on 05/27/2009 by Dr. Tia Alert.  S/P anterolateral retroperitoneal interbody fusion L2-3 utilizing a 8 mm peek interbody cage packed with morcellized allograft,  and anterior lumbar plating L2-3 for recurrent disc herniation L2-3 with spinal stenosis on 01/18/2014 by Dr. Tia Alert.     Stroke (cerebrum) (HCC)    Stroke (HCC) 02/23/2022   SVT (supraventricular tachycardia) (HCC) 08/28/2019   Tobacco use disorder 04/19/2009   Uterine fibroid    Wears dentures    top    Past Surgical History:  Procedure Laterality Date   ANTERIOR LAT LUMBAR FUSION N/A 01/18/2014   Procedure: ANTERIOR LATERAL LUMBAR FUSION LUMBAR TWO-THREE;  Surgeon: Tia Alert, MD;  Location: [facility];  Service: Neurosurgery;  Laterality: N/A;   ANTERIOR LUMBAR FUSION  01/18/2014   AV FISTULA PLACEMENT Left 04/20/2019   Procedure: ARTERIOVENOUS (AV) FISTULA CREATION;  Surgeon: Maeola Harman, MD;  Location: [facility] OR;  Service: Vascular;  Laterality: Left;   BACK SURGERY     BREAST LUMPECTOMY Left 01/2003   BREAST LUMPECTOMY Right 2014   BREAST LUMPECTOMY WITH NEEDLE LOCALIZATION AND AXILLARY SENTINEL LYMPH NODE BX Right 10/13/2012   Procedure: BREAST LUMPECTOMY WITH NEEDLE LOCALIZATION;  Surgeon: Robyne Askew, MD;  Location: [facility];  Service: General;  Laterality: Right;  Right breast wire localized lumpectomy   DIALYSIS/PERMA CATHETER INSERTION N/A 05/28/2023   Procedure: DIALYSIS/PERMA CATHETER INSERTION;  Surgeon: Ethelene Hal, MD;  Location: [facility];  Service: Cardiovascular;  Laterality: N/A;   INSERTION OF DIALYSIS CATHETER Right 04/20/2019   Procedure: INSERTION OF DIALYSIS CATHETER, right internal jugular;  Surgeon: Maeola Harman, MD;  Location: [facility] OR;  Service: Vascular;  Laterality: Right;   INSERTION OF DIALYSIS CATHETER Right 09/24/2020   Procedure: INSERTION OF TUNNELED DIALYSIS CATHETER;  Surgeon: Larina Earthly, MD;  Location: [facility] OR;  Service: Vascular;  Laterality: Right;   IR FLUORO GUIDE CV LINE RIGHT  09/22/2020   IR THORACENTESIS ASP PLEURAL SPACE W/IMG GUIDE  05/19/2019   IR US GUIDE VASC ACCESS LEFT  09/22/2020   IR US GUIDE VASC ACCESS RIGHT  09/22/2020   IR VENOCAVAGRAM SVC  09/22/2020   LAMINECTOMY  05/27/2009   Lumbar decompressive laminectomy, fusion and plating for lumbar spinal stensosis   LIGATION OF ARTERIOVENOUS  FISTULA Left 09/24/2020   Procedure: LIGATION OF LEFT ARM ARTERIOVENOUS  FISTULA;  Surgeon:  Larina Earthly, MD;  Location: [facility] OR;  Service: Vascular;  Laterality: Left;   LUMBAR LAMINECTOMY/DECOMPRESSION MICRODISCECTOMY Left 03/23/2013   Procedure: LUMBAR LAMINECTOMY/DECOMPRESSION MICRODISCECTOMY 1 LEVEL;  Surgeon: Tia Alert, MD;  Location: [facility];  Service: Neurosurgery;  Laterality: Left;  LUMBAR LAMINECTOMY/DECOMPRESSION MICRODISCECTOMY 1 LEVEL   MASTECTOMY, PARTIAL Left 02/26/2003   ; S/P re-excision of cranial and lateral margins 04/19/2003.    RE-EXCISION OF BREAST CANCER,SUPERIOR MARGINS Right 10/27/2012   Procedure: RE-EXCISION OF BREAST CANCER,SUPERIOR and inferior MARGINS;  Surgeon: Robyne Askew, MD;  Location: [facility] OR;  Service: General;  Laterality: Right;   RE-EXCISION OF BREAST LUMPECTOMY Left 04/2003   TEE WITHOUT CARDIOVERSION N/A 04/04/2019   Procedure: Transesophageal Echocardiogram (Tee);  Surgeon: Linden Dolin, MD;  Location: [facility] OR;  Service: Open Heart Surgery;  Laterality: N/A;   THORACIC AORTIC ANEURYSM REPAIR N/A 04/04/2019   Procedure: THORACIC ASCENDING ANEURYSM REPAIR (AAA)  USING 28 MM X 30 CM HEMASHIELD PLATINUM VASCULAR GRAFT;  Surgeon: Linden Dolin, MD;  Location: [facility] OR;  Service: Open Heart Surgery;  Laterality: N/A;   Social History:  reports that she quit smoking about 3 years ago. Her smoking use included cigarettes. She  started smoking about 47 years ago. She has a 11 pack-year smoking history. She has never used smokeless tobacco. She reports that she does not currently use alcohol after a past usage of about 2.0 standard drinks of alcohol per week. She reports that she does not currently use drugs after having used the following drugs: Cocaine.  Allergies  Allergen Reactions   Shrimp [Shellfish Allergy] Shortness Of Breath   Bactroban [Mupirocin] Other (See Comments)    "Sores in nose"   Chlorhexidine Gluconate Itching   Hydrocodone Itching, Nausea And Vomiting and Nausea Only   Latex Itching    Latex gloves cause itchiness and a  rash   Tylenol [Acetaminophen] Itching    Takes Percocet at home 05/06/23   Zestril [Lisinopril] Cough    Family History  Problem Relation Age of Onset   Colon cancer Mother 92   Hypertension Mother    Diabetes Sister 44   Hypertension Sister    Diabetes Brother    Hypertension Brother    Diabetes Brother    Hypertension Brother    Kidney disease Son        s/p renal transplant   Hypertension Son    Diabetes Son    Multiple sclerosis Son    Bone cancer Sister 56   Breast cancer Neg Hx    Cervical cancer Neg Hx     Prior to Admission medications   Medication Sig Start Date End Date Taking? Authorizing Provider  albuterol (PROVENTIL) (2.5 MG/3ML) 0.083% nebulizer solution Take 3 mLs (2.5 mg total) by nebulization every 6 (six) hours as needed for wheezing or shortness of breath. 08/23/23   Pokhrel, Rebekah Chesterfield, MD  albuterol (VENTOLIN HFA) 108 (90 Base) MCG/ACT inhaler Inhale 2 puffs into the lungs every 6 (six) hours as needed for wheezing or shortness of breath. 08/23/23   Pokhrel, Laxman, MD  amLODipine (NORVASC) 10 MG tablet Take 1 tablet (10 mg total) by mouth daily. Patient taking differently: Take 10 mg by mouth in the morning. 03/06/22   Setzer, Lynnell Jude, PA-C  apixaban (ELIQUIS) 2.5 MG TABS tablet Take 1 tablet (2.5 mg total) by mouth 2 (two) times daily. 03/06/22   Setzer, Lynnell Jude, PA-C  carvedilol (COREG) 25 MG tablet Take 1 tablet (25 mg total) by mouth 2 (two) times daily with a meal. 05/01/22 06/16/24  Ghimire, Werner Lean, MD  Cholecalciferol (VITAMIN D-3 PO) Take 1 capsule by mouth in the morning.    [provider]  cloNIDine (CATAPRES) 0.1 MG tablet Take 1 tablet (0.1 mg total) by mouth 3 (three) times daily. 06/19/23   Hughie Closs, MD  hydrALAZINE (APRESOLINE) 100 MG tablet Take 1 tablet (100 mg total) by mouth every 8 (eight) hours. Patient taking differently: Take 100 mg by mouth 2 (two) times daily. 12/18/22   Corky Crafts, MD  isosorbide mononitrate  (IMDUR) 30 MG 24 hr tablet TAKE 1 TABLET (30MG ) ON DIALYSIS DAYS AND 2 TABLETS (60 MG) ON NON DIALYSIS DAYS. 01/12/23   Corky Crafts, MD  LOKELMA 10 g PACK packet Take 10 g by mouth every Monday, Wednesday, and Friday. 07/30/23   [provider]  Methoxy PEG-Epoetin Beta (MIRCERA IJ) every 14 (fourteen) days. 07/06/23 07/04/24  [provider]  naloxone [facility]) nasal spray 4 mg/0.1 mL Place 1 spray into the nose as needed (opioid overdose). 02/19/22   [provider]  oxyCODONE-acetaminophen (PERCOCET) 10-325 MG tablet Take 1 tablet by mouth in the morning and at bedtime. 03/12/22  [provider]  pantoprazole (PROTONIX) 40 MG tablet TAKE 1 TABLET (40 MG TOTAL) BY MOUTH 2 (TWO) TIMES DAILY. 05/13/23   Georganna Skeans, MD  rosuvastatin (CRESTOR) 10 MG tablet TAKE 1 TABLET (10 MG TOTAL) BY MOUTH DAILY FOR CHOLESTEROL Patient taking differently: Take 10 mg by mouth daily. 08/07/22   Georganna Skeans, MD  senna-docusate (SENOKOT-S) 8.6-50 MG tablet Take 1 tablet by mouth 2 (two) times daily between meals as needed for mild constipation. 08/14/22   Almon Hercules, MD  sevelamer carbonate (RENVELA) 2.4 g PACK Take 2.4 g by mouth 3 (three) times daily with meals. 03/06/22   Setzer, Lynnell Jude, PA-C  traZODone (DESYREL) 50 MG tablet Take 25-50 mg by mouth at bedtime as needed for sleep. 03/19/23   [provider]    Physical Exam: Vitals:   08/31/23 1610 08/31/23 0728 08/31/23 0737 08/31/23 1008  BP:   (!) 211/131   Pulse:   (!) 105   Resp:    (!) 24  Temp:      TempSrc:      SpO2: 94%   100%  Weight:  49 kg    Height:  5\' 3"  (1.6 m)     Physical Exam Constitutional:      Comments: Chronically ill-appearing elderly female, laying in bed on BIPAP, NAD.  HENT:     Head: Normocephalic and atraumatic.     Mouth/Throat:     Mouth: Mucous membranes are dry.     Pharynx: Oropharynx is clear. No oropharyngeal exudate.  Eyes:     General: No scleral icterus.     Extraocular Movements: Extraocular movements intact.     Conjunctiva/sclera: Conjunctivae normal.     Pupils: Pupils are equal, round, and reactive to light.  Cardiovascular:     Rate and Rhythm: Normal rate and regular rhythm.     Heart sounds: Normal heart sounds. No murmur heard.    No friction rub. No gallop.  Pulmonary:     Comments: On BIPAP with improved WOB. Bibasilar crackles noted on exam, no wheezing or rhonchi appreciated. Saturating 100% on BIPAP. Abdominal:     General: Bowel sounds are normal. There is no distension.     Palpations: Abdomen is soft.     Tenderness: There is no abdominal tenderness. There is no guarding or rebound.  Musculoskeletal:        General: Normal range of motion.     Cervical back: Normal range of motion.     Comments: 0-1+ pitting edema in bilateral LE.  Skin:    General: Skin is warm and dry.  Neurological:     Mental Status: She is alert and oriented to person, place, and time.  Psychiatric:        Mood and Affect: Mood normal.        Behavior: Behavior normal.     Data Reviewed:  There are no new results to review at this time.    Latest Ref Rng & Units 08/31/2023    8:11 AM 08/31/2023    8:00 AM 08/23/2023    4:00 AM  CBC  WBC 4.0 - 10.5 K/uL  7.3  3.6   Hemoglobin 12.0 - 15.0 g/dL 96.0 - 45.4 g/dL 09.8    11.9  14.7  82.9   Hematocrit 36.0 - 46.0 % 36.0 - 46.0 % 46.0    45.0  43.7  34.8   Platelets 150 - 400 K/uL  PLATELET CLUMPS NOTED ON SMEAR, UNABLE TO ESTIMATE  193       Latest Ref Rng & Units 08/31/2023    8:11 AM 08/31/2023    8:00 AM 08/23/2023    4:00 AM  CMP  Glucose 70 - 99 mg/dL 657  846  962   BUN 8 - 23 mg/dL 50  43  39   Creatinine 0.44 - 1.00 mg/dL 95.28  41.32  4.40   Sodium 135 - 145 mmol/L 135 - 145 mmol/L 136    136  140  135   Potassium 3.5 - 5.1 mmol/L 3.5 - 5.1 mmol/L 6.2    6.1  6.4  4.2   Chloride 98 - 111 mmol/L 104  97  96   CO2 22 - 32 mmol/L  26  27   Calcium 8.9 - 10.3 mg/dL  9.9  8.3    Total Protein 6.5 - 8.1 g/dL  7.9    Total Bilirubin 0.0 - 1.2 mg/dL  0.7    Alkaline Phos 38 - 126 U/L  73    AST 15 - 41 U/L  24    ALT 0 - 44 U/L  10     BNP    Component Value Date/Time   BNP >4,500.0 (H) 08/31/2023 0800   Hs-troponins: 65   Assessment and Plan: No notes have been filed under this hospital service. Service: Hospitalist   Acute hypoxic respiratory failure 2/2 volume overload HFrEF exacerbation ESRD on HD TTS Patient presenting with worsening shortness of breath since last night, found to be in acute hypoxic respiratory failure secondary to volume overload.  She was admitted for similar symptoms last week.  Patient has been adherent with her dialysis schedule and is due for dialysis today.  She is volume overloaded on exam.  BNP elevated and chest x-ray showing pulmonary edema.  Nephrology has been consulted, will dialyze today.  Given increased work of breathing, tachypnea, and VBG showing respiratory alkalosis, patient was placed on BiPAP and now appears more comfortable.  Patient does not make urine any longer and her volume status is primarily managed with HD in the outpatient setting. -Nephrology following, appreciate management -Plan for dialysis today -BiPAP, will check VBG in 1 to 2 hours to ensure improvement prior to removing BiPAP -Supplemental oxygen as needed to maintain O2 sats greater than 92% -telemetry -Anticipate relatively quick improvement after dialysis today -Place in observation/progressive  Hypertensive emergency 2/2 volume overload Hx of HTN Patient presenting with hypertensive emergency in the setting of volume overload and missed antihypertensives.  Blood pressure on arrival elevated at 211/131.  Patient is volume overloaded as noted above, likely contributing.  Patient also reports that she has not taken her antihypertensives today.  Given significantly elevated blood pressure, worsening oxygen requirement, NSTEMI, pulmonary edema she  does meet criteria for hypertensive emergency.  She was started on nitroglycerin drip in the ED with improvement in blood pressures to the 180s/100s.  ED provider has also restarted her home Coreg 25 mg twice daily and hydralazine 100 mg every 8 hours.  Holding home Norvasc 10 mg daily, clonidine 0.1 mg 3 times daily, Imdur 30 mg daily until dialyzed to ensure BP does not drop rapidly. -nitroglycerin drip, down-titrate as able -resume home coreg 25mg  BID and hydralazine 100mg  q8h -holding home imdur, clonidine, norvasc -HD today per nephrology -trend BP curve, goal <160/100 through today and normalize over next day  Paroxysmal A-fib -EKG showing normal sinus rhythm currently -resumed home eliquis 2.5mg  BID -resumed coreg 25mg  BID  Type 2 NSTEMI -  troponin 65, awaiting repeat -EKG nonischemic -likely demand ischemia in the setting of hypertensive emergency and acute hypoxic respiratory failure from volume overload -trend troponins  Hyperkalemia -K 6.4 today -EKG without any changes due to hyperkalemia -given corrective measures including insulin/dextrose, calcium gluconate, and lokelma 10g while in ED -dialysis today per nephrology -trend potassium levels   Anemia of ESRD -chronic and stable, hgb improved from baseline on lab work today -on mircera in the outpatient setting  Hx of CVA HLD -resume home crestor 10mg  daily -resume home eliquis 2.5mg  BID  COPD -chronic and stable -not in acute exacerbation, no wheezing noted -resume home albuterol nebs q6h prn    Advance Care Planning:   Code Status: Limited: Do not attempt resuscitation (DNR) -DNR-LIMITED -Do Not Intubate/DNI    Consults: nephrology  Family Communication: no family at bedside  Severity of Illness: The appropriate patient status for this patient is OBSERVATION. Observation status is judged to be reasonable and necessary in order to provide the required intensity of service to ensure the patient's safety. The  patient's presenting symptoms, physical exam findings, and initial radiographic and laboratory data in the context of their medical condition is felt to place them at decreased risk for further clinical deterioration. Furthermore, it is anticipated that the patient will be medically stable for discharge from the hospital within 2 midnights of admission.   Portions of this note were generated with Scientist, clinical (histocompatibility and immunogenetics). Dictation errors may occur despite best attempts at proofreading.  Author: Briscoe Burns, MD 08/31/2023 11:07 AM  For on call review [email or web address].

## 2023-08-31 NOTE — Plan of Care (Signed)

## 2023-08-31 NOTE — Progress Notes (Signed)
 RT at bedside to find pt has moved to new room and is off BiPAP at this time. Per RN, pt tolerating well at this time being off BiPAP.

## 2023-08-31 NOTE — Procedures (Signed)
 I was present at the procedure, reviewed the HD regimen and made appropriate changes.   Vinson Moselle MD  [facility] 08/31/2023, 1:40 PM

## 2023-08-31 NOTE — Hospital Course (Signed)
 HTNsive emergency Volume overload Hyperkalemia -SHOB worsening since last night -chest discomfort -adherent with HD -BP 220s/130s -nitroglycerin drip -lokelma -BIPAP  Admitted 1 wk ago

## 2023-08-31 NOTE — Consult Note (Signed)
 Renal Service Consult Note [facility]  Monique Henderson 08/31/2023 [name], MD Requesting Physician: Dr.Schlossman  Reason for Consult: ESRD pt w/ resp distress HPI: The patient is a 73 y.o. year-old w/ PMH as below who presented to ED w/ SOB. In ED BP's were high. Pt had not been taking her BP medications. Pt refused bipap initially but is now agreeable.  CXR showed pulm edema. Pt to be admitted. We are asked to see for dialysis.    Pt seen in ED.  Pt is not looking very good. Sig Arvella Merles. RT has been called back to put her on bipap. No hx obtained.   PMH S/P CVA  Atrial fib Aortic dissection Breast cancer HFrEF COPD ESRD on HD Hep C Tobacco use disorder   ROS - denies CP, no joint pain, no HA, no blurry vision, no rash, no diarrhea, no nausea/ vomiting   Past Medical History  Past Medical History:  Diagnosis Date   Acute cardioembolic stroke (HCC) 02/26/2022   AF (paroxysmal atrial fibrillation) (HCC) 05/29/2019   on Coumadin   Aortic atherosclerosis (HCC) 07/05/2019   Aortic dissection (HCC) 04/04/2019   s/p repair   Bone spur 2008   Right calcaneal foot spur   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 Chevis Pretty; S/P re-excision of superior and inferior margins 10/27/2012.    Dissection of aorta (HCC) 04/03/2019   ESRD on hemodialysis (HCC) 05/29/2019   Essential hypertension 09/16/2006   GERD 09/16/2006   Hepatitis C    treated and RNA confirmed not detectable  01/2017   Insomnia 03/14/2015   Malnutrition of moderate degree 05/19/2019   Non compliance with medical treatment 12/04/2017   Normocytic anemia    With thrombocytosis   Osteoarthritis    Right ureteral stone 2002   S/P lumbar spinal fusion 01/18/2014   S/P lumbar decompressive laminectomy, fusion, and plating for lumbar spinal stenosis on 05/27/2009 by Dr. Tia Alert.  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 Tia Alert.     Stroke (cerebrum) (HCC)    Stroke (HCC) 02/23/2022   SVT (supraventricular tachycardia) (HCC) 08/28/2019   Tobacco use disorder 04/19/2009   Uterine fibroid    Wears dentures    top   Past Surgical History  Past Surgical History:  Procedure Laterality Date   ANTERIOR LAT LUMBAR FUSION N/A 01/18/2014   Procedure: ANTERIOR LATERAL LUMBAR FUSION LUMBAR TWO-THREE;  Surgeon: Tia Alert, MD;  Location: [facility];  Service: Neurosurgery;  Laterality: N/A;   ANTERIOR LUMBAR FUSION  01/18/2014   AV FISTULA PLACEMENT Left 04/20/2019   Procedure: ARTERIOVENOUS (AV) FISTULA CREATION;  Surgeon: Maeola Harman, MD;  Location: [facility] OR;  Service: Vascular;  Laterality: Left;   BACK SURGERY     BREAST LUMPECTOMY Left 01/2003   BREAST LUMPECTOMY Right 2014   BREAST LUMPECTOMY WITH  NEEDLE LOCALIZATION AND AXILLARY SENTINEL LYMPH NODE BX Right 10/13/2012   Procedure: BREAST LUMPECTOMY WITH NEEDLE LOCALIZATION;  Surgeon: Robyne Askew, MD;  Location: [facility];  Service: General;  Laterality: Right;  Right breast wire localized lumpectomy   DIALYSIS/PERMA CATHETER INSERTION N/A 05/28/2023   Procedure: DIALYSIS/PERMA CATHETER INSERTION;  Surgeon: Ethelene Hal, MD;  Location: [facility] INVASIVE CV LAB;  Service: Cardiovascular;  Laterality: N/A;   INSERTION OF  DIALYSIS CATHETER Right 04/20/2019   Procedure: INSERTION OF DIALYSIS CATHETER, right internal jugular;  Surgeon: Maeola Harman, MD;  Location: [facility] OR;  Service: Vascular;  Laterality: Right;   INSERTION OF DIALYSIS CATHETER Right 09/24/2020   Procedure: INSERTION OF TUNNELED DIALYSIS CATHETER;  Surgeon: Larina Earthly, MD;  Location: [facility] OR;  Service: Vascular;  Laterality: Right;   IR FLUORO GUIDE CV LINE RIGHT  09/22/2020   IR THORACENTESIS ASP PLEURAL SPACE W/IMG GUIDE  05/19/2019   IR US GUIDE VASC ACCESS LEFT  09/22/2020   IR US GUIDE VASC ACCESS RIGHT  09/22/2020   IR VENOCAVAGRAM SVC  09/22/2020   LAMINECTOMY  05/27/2009   Lumbar decompressive laminectomy, fusion and plating for lumbar spinal stensosis   LIGATION OF ARTERIOVENOUS  FISTULA Left 09/24/2020   Procedure: LIGATION OF LEFT ARM ARTERIOVENOUS  FISTULA;  Surgeon: Larina Earthly, MD;  Location: [facility] OR;  Service: Vascular;  Laterality: Left;   LUMBAR LAMINECTOMY/DECOMPRESSION MICRODISCECTOMY Left 03/23/2013   Procedure: LUMBAR LAMINECTOMY/DECOMPRESSION MICRODISCECTOMY 1 LEVEL;  Surgeon: Tia Alert, MD;  Location: [facility];  Service: Neurosurgery;  Laterality: Left;  LUMBAR LAMINECTOMY/DECOMPRESSION MICRODISCECTOMY 1 LEVEL   MASTECTOMY, PARTIAL Left 02/26/2003   ; S/P re-excision of cranial and lateral margins 04/19/2003.    RE-EXCISION OF BREAST CANCER,SUPERIOR MARGINS Right 10/27/2012   Procedure: RE-EXCISION OF BREAST CANCER,SUPERIOR and inferior MARGINS;  Surgeon: Robyne Askew, MD;  Location: [facility] OR;  Service: General;  Laterality: Right;   RE-EXCISION OF BREAST LUMPECTOMY Left 04/2003   TEE WITHOUT CARDIOVERSION N/A 04/04/2019   Procedure: Transesophageal Echocardiogram (Tee);  Surgeon: Linden Dolin, MD;  Location: [facility] OR;  Service: Open Heart Surgery;  Laterality: N/A;   THORACIC AORTIC ANEURYSM REPAIR N/A 04/04/2019   Procedure: THORACIC ASCENDING ANEURYSM REPAIR (AAA)  USING 28 MM X 30 CM HEMASHIELD PLATINUM  VASCULAR GRAFT;  Surgeon: Linden Dolin, MD;  Location: [facility] OR;  Service: Open Heart Surgery;  Laterality: N/A;   Family History  Family History  Problem Relation Age of Onset   Colon cancer Mother 58   Hypertension Mother    Diabetes Sister 55   Hypertension Sister    Diabetes Brother    Hypertension Brother    Diabetes Brother    Hypertension Brother    Kidney disease Son        s/p renal transplant   Hypertension Son    Diabetes Son    Multiple sclerosis Son    Bone cancer Sister 75   Breast cancer Neg Hx    Cervical cancer Neg Hx    Social History  reports that she quit smoking about 3 years ago. Her smoking use included cigarettes. She started smoking about 47 years ago. She has a 11 pack-year smoking history. She has never used smokeless tobacco. She reports that she does not currently use alcohol after a past usage of about 2.0 standard drinks of alcohol per week. She reports that she does not currently use drugs after having used the following drugs: Cocaine.  Allergies  Allergies  Allergen Reactions   Shrimp [Shellfish Allergy] Shortness Of Breath   Bactroban [Mupirocin] Other (See Comments)    "Sores in nose"   Chlorhexidine Gluconate Itching   Hydrocodone Itching, Nausea And Vomiting and Nausea Only   Latex Itching    Latex gloves cause itchiness and a rash   Tylenol [Acetaminophen] Itching    Takes Percocet at home 05/06/23   Zestril [Lisinopril] Cough   Home medications Prior to Admission medications   Medication Sig Start Date End Date Taking? Authorizing Provider  albuterol (PROVENTIL) (2.5 MG/3ML) 0.083% nebulizer solution Take 3 mLs (2.5 mg total) by nebulization every 6 (six) hours as needed for wheezing or shortness of breath. 08/23/23   Pokhrel, Rebekah Chesterfield, MD  albuterol (VENTOLIN HFA) 108 (90 Base) MCG/ACT inhaler Inhale 2 puffs into the lungs every 6 (six) hours as needed for wheezing or shortness of breath. 08/23/23   Pokhrel, Laxman, MD  amLODipine  (NORVASC) 10 MG tablet Take 1 tablet (10 mg total) by mouth daily. Patient taking differently: Take 10 mg by mouth in the morning. 03/06/22   Setzer, Lynnell Jude, PA-C  apixaban (ELIQUIS) 2.5 MG TABS tablet Take 1 tablet (2.5 mg total) by mouth 2 (two) times daily. 03/06/22   Setzer, Lynnell Jude, PA-C  carvedilol (COREG) 25 MG tablet Take 1 tablet (25 mg total) by mouth 2 (two) times daily with a meal. 05/01/22 06/16/24  Ghimire, Werner Lean, MD  Cholecalciferol (VITAMIN D-3 PO) Take 1 capsule by mouth in the morning.    [provider]  cloNIDine (CATAPRES) 0.1 MG tablet Take 1 tablet (0.1 mg total) by mouth 3 (three) times daily. 06/19/23   Hughie Closs, MD  hydrALAZINE (APRESOLINE) 100 MG tablet Take 1 tablet (100 mg total) by mouth every 8 (eight) hours. Patient taking differently: Take 100 mg by mouth 2 (two) times daily. 12/18/22   Corky Crafts, MD  isosorbide mononitrate (IMDUR) 30 MG 24 hr tablet TAKE 1 TABLET (30MG ) ON DIALYSIS DAYS AND 2 TABLETS (60 MG) ON NON DIALYSIS DAYS. 01/12/23   Corky Crafts, MD  LOKELMA 10 g PACK packet Take 10 g by mouth every Monday, Wednesday, and Friday. 07/30/23   [provider]  Methoxy PEG-Epoetin Beta (MIRCERA IJ) every 14 (fourteen) days. 07/06/23 07/04/24  [provider]  naloxone [facility]) nasal spray 4 mg/0.1 mL Place 1 spray into the nose as needed (opioid overdose). 02/19/22   [provider]  oxyCODONE-acetaminophen (PERCOCET) 10-325 MG tablet Take 1 tablet by mouth in the morning and at bedtime. 03/12/22   [provider]  pantoprazole (PROTONIX) 40 MG tablet TAKE 1 TABLET (40 MG TOTAL) BY MOUTH 2 (TWO) TIMES DAILY. 05/13/23   Georganna Skeans, MD  rosuvastatin (CRESTOR) 10 MG tablet TAKE 1 TABLET (10 MG TOTAL) BY MOUTH DAILY FOR CHOLESTEROL Patient taking differently: Take 10 mg by mouth daily. 08/07/22   Georganna Skeans, MD  senna-docusate (SENOKOT-S) 8.6-50 MG tablet Take 1 tablet by mouth 2 (two) times daily  between meals as needed for mild constipation. 08/14/22   Almon Hercules, MD  sevelamer carbonate (RENVELA) 2.4 g PACK Take 2.4 g by mouth 3 (three) times daily with meals. 03/06/22   Setzer, Lynnell Jude, PA-C  traZODone (DESYREL) 50 MG tablet Take 25-50 mg by mouth at bedtime as needed for sleep. 03/19/23   [provider]     Vitals:   08/31/23 4098 08/31/23 1191 08/31/23 4782 08/31/23 0737  BP:    Marland Kitchen)  211/131  Pulse: (!) 101   (!) 105  Resp: (!) 21     Temp: 97.6 F (36.4 C)     TempSrc: Oral     SpO2: 95% 94%    Weight:   49 kg   Height:   5\' 3"  (1.6 m)    Exam Gen alert, pt in distress, on [place] O2 No rash, cyanosis or gangrene Sclera anicteric, throat clear  No jvd or bruits Chest clear bilat to bases, no rales/ wheezing RRR no MRG Abd soft ntnd no mass or ascites +bs GU defer MS no joint effusions or deformity Ext  1+ pretib edema bilat, no other edema Neuro is alert, Ox 3 , nf    RIJ TDC intact   OP HD: AF TTS  3.5h   B400    2k bath   RIJ TDC  Heparin none   CXR - IMPRESSION:  Increased bilateral interstitial and patchy bibasilar opacities, more pronounced on the right, could represent worsening pulmonary edema or infection.   Assessment/ Plan: HTNsive urgency: on IV ntg, prn IV bp meds, per pmd ESRD: on HD TTS. Has not missed HD. Plan HD upstairs next slot available assuming she stabilizes on bipap.  Volume: mild vol excess on exam, UF goal 3 L w/ HD Anemia of esrd: Hb 13, stable Secondary hyperparathyroidism: CCa in range, cont binders ac tid.  HFrEF H/o CVA COPD      Vinson Moselle  MD [facility] 08/31/2023, 9:52 AM  Recent Labs  Lab 08/31/23 0800 08/31/23 0811  HGB 13.4 15.3*  15.6*  ALBUMIN 4.2  --   CALCIUM 9.9  --   CREATININE 10.46* 11.50*  K 6.4* 6.1*  6.2*   Inpatient medications:  carvedilol  25 mg Oral BID WC   insulin aspart  5 Units Intravenous Once   And   dextrose  1 ampule Intravenous Once   hydrALAZINE  100 mg Oral Q8H    calcium  gluconate 1,000 mg (08/31/23 0949)   nitroGLYCERIN 5 mcg/min (08/31/23 0832)

## 2023-09-01 DIAGNOSIS — Z1152 Encounter for screening for COVID-19: Secondary | ICD-10-CM | POA: Diagnosis not present

## 2023-09-01 DIAGNOSIS — Z66 Do not resuscitate: Secondary | ICD-10-CM | POA: Diagnosis present

## 2023-09-01 DIAGNOSIS — R0602 Shortness of breath: Secondary | ICD-10-CM | POA: Diagnosis present

## 2023-09-01 DIAGNOSIS — J449 Chronic obstructive pulmonary disease, unspecified: Secondary | ICD-10-CM | POA: Diagnosis present

## 2023-09-01 DIAGNOSIS — R739 Hyperglycemia, unspecified: Secondary | ICD-10-CM | POA: Diagnosis not present

## 2023-09-01 DIAGNOSIS — I509 Heart failure, unspecified: Secondary | ICD-10-CM

## 2023-09-01 DIAGNOSIS — E873 Alkalosis: Secondary | ICD-10-CM | POA: Diagnosis present

## 2023-09-01 DIAGNOSIS — M1990 Unspecified osteoarthritis, unspecified site: Secondary | ICD-10-CM | POA: Diagnosis present

## 2023-09-01 DIAGNOSIS — I132 Hypertensive heart and chronic kidney disease with heart failure and with stage 5 chronic kidney disease, or end stage renal disease: Secondary | ICD-10-CM | POA: Diagnosis present

## 2023-09-01 DIAGNOSIS — N2581 Secondary hyperparathyroidism of renal origin: Secondary | ICD-10-CM | POA: Diagnosis present

## 2023-09-01 DIAGNOSIS — I480 Paroxysmal atrial fibrillation: Secondary | ICD-10-CM | POA: Diagnosis present

## 2023-09-01 DIAGNOSIS — I5043 Acute on chronic combined systolic (congestive) and diastolic (congestive) heart failure: Secondary | ICD-10-CM | POA: Diagnosis present

## 2023-09-01 DIAGNOSIS — J9601 Acute respiratory failure with hypoxia: Secondary | ICD-10-CM | POA: Diagnosis present

## 2023-09-01 DIAGNOSIS — I471 Supraventricular tachycardia, unspecified: Secondary | ICD-10-CM | POA: Diagnosis not present

## 2023-09-01 DIAGNOSIS — E875 Hyperkalemia: Secondary | ICD-10-CM | POA: Diagnosis present

## 2023-09-01 DIAGNOSIS — I161 Hypertensive emergency: Secondary | ICD-10-CM | POA: Diagnosis present

## 2023-09-01 DIAGNOSIS — I700 Atherosclerosis of aorta: Secondary | ICD-10-CM | POA: Diagnosis present

## 2023-09-01 DIAGNOSIS — Z681 Body mass index (BMI) 19 or less, adult: Secondary | ICD-10-CM | POA: Diagnosis not present

## 2023-09-01 DIAGNOSIS — I5082 Biventricular heart failure: Secondary | ICD-10-CM | POA: Diagnosis present

## 2023-09-01 DIAGNOSIS — I21A1 Myocardial infarction type 2: Secondary | ICD-10-CM | POA: Diagnosis present

## 2023-09-01 DIAGNOSIS — E785 Hyperlipidemia, unspecified: Secondary | ICD-10-CM | POA: Diagnosis present

## 2023-09-01 DIAGNOSIS — G8929 Other chronic pain: Secondary | ICD-10-CM | POA: Diagnosis present

## 2023-09-01 DIAGNOSIS — D631 Anemia in chronic kidney disease: Secondary | ICD-10-CM | POA: Diagnosis present

## 2023-09-01 DIAGNOSIS — Z992 Dependence on renal dialysis: Secondary | ICD-10-CM | POA: Diagnosis not present

## 2023-09-01 DIAGNOSIS — N186 End stage renal disease: Secondary | ICD-10-CM | POA: Diagnosis present

## 2023-09-01 DIAGNOSIS — E440 Moderate protein-calorie malnutrition: Secondary | ICD-10-CM | POA: Diagnosis present

## 2023-09-01 LAB — RENAL FUNCTION PANEL
Albumin: 3.4 g/dL — ABNORMAL LOW (ref 3.5–5.0)
Anion gap: 12 (ref 5–15)
BUN: 25 mg/dL — ABNORMAL HIGH (ref 8–23)
CO2: 26 mmol/L (ref 22–32)
Calcium: 9.2 mg/dL (ref 8.9–10.3)
Chloride: 94 mmol/L — ABNORMAL LOW (ref 98–111)
Creatinine, Ser: 6.53 mg/dL — ABNORMAL HIGH (ref 0.44–1.00)
GFR, Estimated: 6 mL/min — ABNORMAL LOW (ref >60)
Glucose, Bld: 112 mg/dL — ABNORMAL HIGH (ref 70–99)
Phosphorus: 6.2 mg/dL — ABNORMAL HIGH (ref 2.5–4.6)
Potassium: 5.2 mmol/L — ABNORMAL HIGH (ref 3.5–5.1)
Sodium: 132 mmol/L — ABNORMAL LOW (ref 135–145)

## 2023-09-01 LAB — HEMOGLOBIN A1C
Hgb A1c MFr Bld: 4.1 % — ABNORMAL LOW (ref 4.8–5.6)
Mean Plasma Glucose: 70.97 mg/dL

## 2023-09-01 LAB — CBC
HCT: 39 % (ref 36.0–46.0)
Hemoglobin: 12 g/dL (ref 12.0–15.0)
MCH: 29.3 pg (ref 26.0–34.0)
MCHC: 30.8 g/dL (ref 30.0–36.0)
MCV: 95.1 fL (ref 80.0–100.0)
Platelets: 226 10*3/uL (ref 150–400)
RBC: 4.1 MIL/uL (ref 3.87–5.11)
RDW: 19 % — ABNORMAL HIGH (ref 11.5–15.5)
WBC: 4.4 10*3/uL (ref 4.0–10.5)
nRBC: 0 % (ref 0.0–0.2)

## 2023-09-01 NOTE — Plan of Care (Signed)

## 2023-09-01 NOTE — Progress Notes (Addendum)
 PROGRESS NOTE    Monique Henderson  ZOX:[id number] DOB: 06-Oct-1950 DOA: 08/31/2023 PCP: Fleet Contras, MD   73/F w ESRD on HD TTS, HFrEF, anemia of ESRD, paroxysmal A fib, hx of CVA, COPD, HTN, HLD, s/p aortic aneurysm repair (2020) presented to the ED with worsening dyspnea, in ED, BP 211/131, tachypneic, hypoxic,  labs noted potassium 6.4, troponin 65 chest x-ray with pulmonary edema. -Placed on nitroglycerin gtt. and BiPAP -Urgent hemodialysis 4/1   Subjective: -Feels better, breathing is improving, not on oxygen at home  Assessment and Plan:  Acute hypoxic respiratory failure, pulmonary edema ESRD on hemodialysis -Weaned off BiPAP, urgent HD yesterday, improving,  -attempt to wean O2, next dialysis tomorrow -Increase activity, PT eval  Hyperkalemia -Improved with HD yesterday  Hypertensive urgency -Wean off nitroglycerin gtt. Today -Continue hydralazine, Coreg and amlodipine  Chronic combined CHF, biventricular failure -Last echo 1/25 with a EF 45%, grade 2 DD, moderately reduced RV -Volume managed with dialysis, continue Coreg  Hyperglycemia -Add on A1c to a.m. labs  Paroxysmal atrial fibrillation -Currently in sinus rhythm continue Coreg and Eliquis  Elevated troponin secondary to demand ischemia, type II MI -Secondary to pulmonary edema, no ACS  Anemia of chronic disease -Stable  History of CVA -Continue Eliquis and Crestor  History of COPD -Stable, no wheezing at this time, continue albuterol PRN   DVT prophylaxis: Eliquis Code Status: DNR Family Communication: None present Disposition Plan: Home tomorrow if stable  Consultants:    Procedures:   Antimicrobials:    Objective: Vitals:   09/01/23 0235 09/01/23 0314 09/01/23 0723 09/01/23 0916  BP: (!) 177/118 (!) 175/96 (!) 150/89 (!) 146/88  Pulse:  84 77   Resp:  20 18   Temp:   98.7 F (37.1 C)   TempSrc:   Oral   SpO2:  100% (!) 88%   Weight:      Height:        Intake/Output  Summary (Last 24 hours) at 09/01/2023 0939 Last data filed at 09/01/2023 0403 Gross per 24 hour  Intake 234.85 ml  Output 2800 ml  Net -2565.15 ml   Filed Weights   08/31/23 0728  Weight: 49 kg    Examination:  General exam: Chronically ill appears older than stated age, AAO x 2, cognitive deficits CVS: S1-S2, regular rhythm Lungs: Poor air movement bilaterally Abdomen: Soft, nontender, bowel sounds present Extremities: No edema  Skin: No rashes Psychiatry: Flat affect    Data Reviewed:   CBC: Recent Labs  Lab 08/31/23 0800 08/31/23 0811 09/01/23 0242  WBC 7.3  --  4.4  NEUTROABS 5.8  --   --   HGB 13.4 15.3*  15.6* 12.0  HCT 43.7 45.0  46.0 39.0  MCV 96.7  --  95.1  PLT PLATELET CLUMPS NOTED ON SMEAR, UNABLE TO ESTIMATE  --  226   Basic Metabolic Panel: Recent Labs  Lab 08/31/23 0800 08/31/23 0811 09/01/23 0242  NA 140 136  136 132*  K 6.4* 6.1*  6.2* 5.2*  CL 97* 104 94*  CO2 26  --  26  GLUCOSE 126* 124* 112*  BUN 43* 50* 25*  CREATININE 10.46* 11.50* 6.53*  CALCIUM 9.9  --  9.2  PHOS  --   --  6.2*   GFR: Estimated Creatinine Clearance: 6.2 mL/min (A) (by C-G formula based on SCr of 6.53 mg/dL (H)). Liver Function Tests: Recent Labs  Lab 08/31/23 0800 09/01/23 0242  AST 24  --   ALT 10  --  ALKPHOS 73  --   BILITOT 0.7  --   PROT 7.9  --   ALBUMIN 4.2 3.4*   No results for input(s): "LIPASE", "AMYLASE" in the last 168 hours. No results for input(s): "AMMONIA" in the last 168 hours. Coagulation Profile: No results for input(s): "INR", "PROTIME" in the last 168 hours. Cardiac Enzymes: No results for input(s): "CKTOTAL", "CKMB", "CKMBINDEX", "TROPONINI" in the last 168 hours. BNP (last 3 results) No results for input(s): "PROBNP" in the last 8760 hours. HbA1C: No results for input(s): "HGBA1C" in the last 72 hours. CBG: No results for input(s): "GLUCAP" in the last 168 hours. Lipid Profile: No results for input(s): "CHOL", "HDL",  "LDLCALC", "TRIG", "CHOLHDL", "LDLDIRECT" in the last 72 hours. Thyroid Function Tests: No results for input(s): "TSH", "T4TOTAL", "FREET4", "T3FREE", "THYROIDAB" in the last 72 hours. Anemia Panel: No results for input(s): "VITAMINB12", "FOLATE", "FERRITIN", "TIBC", "IRON", "RETICCTPCT" in the last 72 hours. Urine analysis:    Component Value Date/Time   COLORURINE AMBER (A) 12/27/2021 1840   APPEARANCEUR TURBID (A) 12/27/2021 1840   LABSPEC 1.013 12/27/2021 1840   PHURINE 8.0 12/27/2021 1840   GLUCOSEU NEGATIVE 12/27/2021 1840   GLUCOSEU NEG mg/dL 75/64/3329 5188   HGBUR MODERATE (A) 12/27/2021 1840   BILIRUBINUR NEGATIVE 12/27/2021 1840   BILIRUBINUR negative 05/04/2018 0910   KETONESUR NEGATIVE 12/27/2021 1840   PROTEINUR >=300 (A) 12/27/2021 1840   UROBILINOGEN 0.2 05/04/2018 0910   UROBILINOGEN 1 02/14/2014 0946   NITRITE NEGATIVE 12/27/2021 1840   LEUKOCYTESUR LARGE (A) 12/27/2021 1840   Sepsis Labs: @LABRCNTIP (procalcitonin:4,lacticidven:4)  ) Recent Results (from the past 240 hours)  Resp panel by RT-PCR (RSV, Flu A&B, Covid) Anterior Nasal Swab     Status: None   Collection Time: 08/31/23  7:38 AM   Specimen: Anterior Nasal Swab  Result Value Ref Range Status   SARS Coronavirus 2 by RT PCR NEGATIVE NEGATIVE Final   Influenza A by PCR NEGATIVE NEGATIVE Final   Influenza B by PCR NEGATIVE NEGATIVE Final    Comment: (NOTE) The Xpert Xpress SARS-CoV-2/FLU/RSV plus assay is intended as an aid in the diagnosis of influenza from Nasopharyngeal swab specimens and should not be used as a sole basis for treatment. Nasal washings and aspirates are unacceptable for Xpert Xpress SARS-CoV-2/FLU/RSV testing.  Fact Sheet for Patients: BloggerCourse.com  Fact Sheet for Healthcare Providers: SeriousBroker.it  This test is not yet approved or cleared by the [place] FDA and has been authorized for detection and/or  diagnosis of SARS-CoV-2 by FDA under an Emergency Use Authorization (EUA). This EUA will remain in effect (meaning this test can be used) for the duration of the COVID-19 declaration under Section 564(b)(1) of the Act, 21 U.S.C. section 360bbb-3(b)(1), unless the authorization is terminated or revoked.     Resp Syncytial Virus by PCR NEGATIVE NEGATIVE Final    Comment: (NOTE) Fact Sheet for Patients: BloggerCourse.com  Fact Sheet for Healthcare Providers: SeriousBroker.it  This test is not yet approved or cleared by the [place] FDA and has been authorized for detection and/or diagnosis of SARS-CoV-2 by FDA under an Emergency Use Authorization (EUA). This EUA will remain in effect (meaning this test can be used) for the duration of the COVID-19 declaration under Section 564(b)(1) of the Act, 21 U.S.C. section 360bbb-3(b)(1), unless the authorization is terminated or revoked.  Performed at [facility], [address] [place], [place] [address]   MRSA Next Gen by PCR, Nasal     Status: None  Collection Time: 08/31/23  6:37 PM   Specimen: Nasal Mucosa; Nasal Swab  Result Value Ref Range Status   MRSA by PCR Next Gen NOT DETECTED NOT DETECTED Final    Comment: (NOTE) The GeneXpert MRSA Assay (FDA approved for NASAL specimens only), is one component of a comprehensive MRSA colonization surveillance program. It is not intended to diagnose MRSA infection nor to guide or monitor treatment for MRSA infections. Test performance is not FDA approved in patients less than 20 years old. Performed at [facility], [address], [place], [place] [address]      Radiology Studies: DG Chest Portable 1 View Result Date: 08/31/2023 CLINICAL DATA:  Chest pain. EXAM: PORTABLE CHEST 1 VIEW COMPARISON:  Chest radiograph dated 08/21/2023. FINDINGS: Stable cardiomegaly. Stable right IJ CVC catheter tip projects over the right  atrium. Prior median sternotomy. Aortic atherosclerosis. Bilateral interstitial and patchy bibasilar opacities, more pronounced on the right, are increased compared to the prior exam. Mild left basilar atelectasis. No pneumothorax. No acute osseous abnormality. IMPRESSION: 1. Increased bilateral interstitial and patchy bibasilar opacities, more pronounced on the right, could represent worsening pulmonary edema or infection. Recommend clinical correlation. 2. Stable cardiomegaly. Electronically Signed   By: Hart Robinsons M.D.   On: 08/31/2023 09:55     Scheduled Meds:  amLODipine  10 mg Oral Daily   apixaban  2.5 mg Oral BID   carvedilol  25 mg Oral BID WC   hydrALAZINE  100 mg Oral Q8H   pantoprazole  40 mg Oral BID   rosuvastatin  10 mg Oral Daily   senna-docusate  1 tablet Oral QHS   sevelamer carbonate  2.4 g Oral TID WC   Continuous Infusions:  nitroGLYCERIN 10 mcg/min (09/01/23 0403)     LOS: 0 days    Time spent:    Zannie Cove, MD [facility]   09/01/2023, 9:39 AM

## 2023-09-01 NOTE — Progress Notes (Signed)
 Mobility Specialist Progress Note;   09/01/23 1101  Mobility  Activity Ambulated with assistance in hallway  Level of Assistance Standby assist, set-up cues, supervision of patient - no hands on  Assistive Device None  Distance Ambulated (ft) 150 ft  Activity Response Tolerated well  Mobility Referral Yes  Mobility visit 1 Mobility  Mobility Specialist Start Time (ACUTE ONLY) 1101  Mobility Specialist Stop Time (ACUTE ONLY) 1110  Mobility Specialist Time Calculation (min) (ACUTE ONLY) 9 min   Pt agreeable to mobility and walking O2 test. On RA upon arrival. Required no physical assistance during ambulation, SV. Ambulated on RA, SPO2 90%> w/ no SOB. Pt returned back to bed with all needs met. RN notified.   Caesar Bookman Mobility Specialist Please contact via SecureChat or [employer] [phone]

## 2023-09-01 NOTE — Progress Notes (Signed)
 Nurse requested Mobility Specialist to perform oxygen saturation test with pt which includes removing pt from oxygen both at rest and while ambulating.  Below are the results from that testing.     Patient Saturations on Room Air at Rest = spO2 93%  Patient Saturations on Room Air while Ambulating = sp02 90%> .    At end of testing pt left in room on 0  Liters of oxygen.  Reported results to nurse.    Caesar Bookman Mobility Specialist Please contact via SecureChat or [employer] [phone]

## 2023-09-01 NOTE — Care Management Obs Status (Signed)
 MEDICARE OBSERVATION STATUS NOTIFICATION   Patient Details  Name: Monique Henderson MRN: [id number] Date of Birth: Sep 21, 1950   Medicare Observation Status Notification Given:   O    MOOM LETTER GIVEN Braydon Kullman 09/01/2023, 11:02 AM

## 2023-09-01 NOTE — Progress Notes (Addendum)
 [facility] Progress Note   Subjective:   Reports feeling much better today. Denies SOB, CP, dizziness, nausea.   Objective Vitals:   09/01/23 0235 09/01/23 0314 09/01/23 0723 09/01/23 0916  BP: (!) 177/118 (!) 175/96 (!) 150/89 (!) 146/88  Pulse:  84 77   Resp:  20 18   Temp:   98.7 F (37.1 C)   TempSrc:   Oral   SpO2:  100% (!) 88%   Weight:      Height:       Physical Exam General: Alert female in NAD Heart: RRR, no murmurs, rubs or gallops Lungs: CTA bilaterally, respirations unlabored on RA Abdomen: Soft, non-distended, +BS Extremities: No edema b/l lower extremities Dialysis Access:  R internal jugular [facility]  Additional Objective Labs: Basic Metabolic Panel: Recent Labs  Lab 08/31/23 0800 08/31/23 0811 09/01/23 0242  NA 140 136  136 132*  K 6.4* 6.1*  6.2* 5.2*  CL 97* 104 94*  CO2 26  --  26  GLUCOSE 126* 124* 112*  BUN 43* 50* 25*  CREATININE 10.46* 11.50* 6.53*  CALCIUM 9.9  --  9.2  PHOS  --   --  6.2*   Liver Function Tests: Recent Labs  Lab 08/31/23 0800 09/01/23 0242  AST 24  --   ALT 10  --   ALKPHOS 73  --   BILITOT 0.7  --   PROT 7.9  --   ALBUMIN 4.2 3.4*   No results for input(s): "LIPASE", "AMYLASE" in the last 168 hours. CBC: Recent Labs  Lab 08/31/23 0800 08/31/23 0811 09/01/23 0242  WBC 7.3  --  4.4  NEUTROABS 5.8  --   --   HGB 13.4 15.3*  15.6* 12.0  HCT 43.7 45.0  46.0 39.0  MCV 96.7  --  95.1  PLT PLATELET CLUMPS NOTED ON SMEAR, UNABLE TO ESTIMATE  --  226   Blood Culture    Component Value Date/Time   SDES BLOOD SITE NOT SPECIFIED 07/22/2023 1558   SPECREQUEST  07/22/2023 1558    BOTTLES DRAWN AEROBIC AND ANAEROBIC Blood Culture results may not be optimal due to an inadequate volume of blood received in culture bottles   CULT  07/22/2023 1558    NO GROWTH 5 DAYS Performed at [facility], [address], [place], [place] [address]    REPTSTATUS 07/27/2023 FINAL 07/22/2023 1558     Cardiac Enzymes: No results for input(s): "CKTOTAL", "CKMB", "CKMBINDEX", "TROPONINI" in the last 168 hours. CBG: No results for input(s): "GLUCAP" in the last 168 hours. Iron Studies: No results for input(s): "IRON", "TIBC", "TRANSFERRIN", "FERRITIN" in the last 72 hours. @lablastinr3 @ Studies/Results: DG Chest Portable 1 View Result Date: 08/31/2023 CLINICAL DATA:  Chest pain. EXAM: PORTABLE CHEST 1 VIEW COMPARISON:  Chest radiograph dated 08/21/2023. FINDINGS: Stable cardiomegaly. Stable right IJ CVC catheter tip projects over the right atrium. Prior median sternotomy. Aortic atherosclerosis. Bilateral interstitial and patchy bibasilar opacities, more pronounced on the right, are increased compared to the prior exam. Mild left basilar atelectasis. No pneumothorax. No acute osseous abnormality. IMPRESSION: 1. Increased bilateral interstitial and patchy bibasilar opacities, more pronounced on the right, could represent worsening pulmonary edema or infection. Recommend clinical correlation. 2. Stable cardiomegaly. Electronically Signed   By: Hart Robinsons M.D.   On: 08/31/2023 09:55   Medications:   amLODipine  10 mg Oral Daily   apixaban  2.5 mg Oral BID   carvedilol  25 mg Oral BID WC  hydrALAZINE  100 mg Oral Q8H   pantoprazole  40 mg Oral BID   rosuvastatin  10 mg Oral Daily   senna-docusate  1 tablet Oral QHS   sevelamer carbonate  2.4 g Oral TID WC    OP Dialysis Orders:  AF TTS  3.5h  B400  46kg   2k bath RIJ TDC  Heparin none    CXR - IMPRESSION:  Increased bilateral interstitial and patchy bibasilar opacities, more pronounced on the right, could represent worsening pulmonary edema or infection.   Assessment/Plan: HTNsive urgency: Had not been taking her home meds. Was on nitroglycerin drip. Home meds resumed and BP improved post HD. ESRD: on HD TTS. May need a lower EDW- will get standing weights today to evaluate Volume: Volume status improved. At dry wt today.   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 Continue binders ac tid.     Rogers Blocker, PA-C 09/01/2023, 10:09 AM  [facility] Pager: [phone]  At dry wt today. Get oximetry w/ ambulation.  Pt seen, examined and agree w A/P as above.  Vinson Moselle MD  [facility] 09/01/2023, 2:57 PM

## 2023-09-01 NOTE — TOC Initial Note (Addendum)
 Transition of Care [facility]) - Initial/Assessment Note    Patient Details  Name: Monique Henderson MRN: [id number] Date of Birth: 1951/02/18  [facility] CM/SW Contact:    Marliss Coots, LCSW Phone Number: 09/01/2023, 2:36 PM  Clinical Narrative:                  2:36 PM CSW introduced self and role to patient at bedside. CSW offered support/resources. Patient expressed interest in bus passes for discharge. Patient stated she is from home where she resides with significant other. Patient informed CSW that she has used home health in the past but was unsure of the agency's name.  Expected Discharge Plan: Home/Self Care Barriers to Discharge: Continued Medical Work up   Patient Goals and CMS Choice Patient states their goals for this hospitalization and ongoing recovery are:: to return home          Expected Discharge Plan and Services       Living arrangements for the past 2 months: Apartment                                      Prior Living Arrangements/Services Living arrangements for the past 2 months: Apartment Lives with:: Significant Other Patient language and need for interpreter reviewed:: Yes Do you feel safe going back to the place where you live?: Yes            Criminal Activity/Legal Involvement Pertinent to Current Situation/Hospitalization: No - Comment as needed  Activities of Daily Living   ADL Screening (condition at time of admission) Independently performs ADLs?: Yes (appropriate for developmental age) Is the patient deaf or have difficulty hearing?: No Does the patient have difficulty seeing, even when wearing glasses/contacts?: No Does the patient have difficulty concentrating, remembering, or making decisions?: No  Permission Sought/Granted Permission sought to share information with : Family Supports Permission granted to share information with : No  Share Information with NAME: Elinor Dodge     Permission granted to  share info w Relationship: Sister  Permission granted to share info w Contact Information: [phone]  Emotional Assessment Appearance:: Appears stated age Attitude/Demeanor/Rapport: Engaged Affect (typically observed): Accepting, Adaptable, Calm, Stable, Appropriate, Pleasant Orientation: : Oriented to Self, Oriented to Place, Oriented to  Time, Oriented to Situation Alcohol / Substance Use: Not Applicable Psych Involvement: No (comment)  Admission diagnosis:  Hyperkalemia [E87.5] Acute pulmonary edema (HCC) [J81.0] Hypertensive emergency [I16.1] Acute hypoxic respiratory failure (HCC) [J96.01] CHF (congestive heart failure) (HCC) [I50.9] Patient Active Problem List   Diagnosis Date Noted   CHF (congestive heart failure) (HCC) 09/01/2023   Acute exacerbation of chronic obstructive pulmonary disease (COPD) (HCC) 08/22/2023   SOB (shortness of breath) 08/21/2023   Hypertensive crisis 08/10/2023   Cellulitis 07/25/2023   Complication of dialysis access insertion 07/22/2023   History of COPD 07/22/2023   Atrial fibrillation with RVR (HCC) 06/26/2023   Acute on chronic respiratory failure with hypoxia (HCC) 06/17/2023   Paroxysmal atrial flutter (HCC) 06/05/2023   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   Elevated troponin level not due to acute coronary syndrome 06/05/2023   History of aortic dissection 06/05/2023   History of cocaine abuse (HCC) 06/05/2023   Noncompliance with medication regimen 06/05/2023   End stage renal disease (HCC) 06/05/2023   Chronic heart failure with mildly reduced ejection fraction (HFmrEF, 41-49%) (HCC) 06/05/2023   Long term (current) use of anticoagulants  06/05/2023   Acute hypoxic respiratory failure (HCC) 06/02/2023   Acute respiratory failure with hypoxia (HCC) 05/07/2023   Acute hypoxemic respiratory failure (HCC) 05/06/2023   Advance care planning 01/15/2023   Hypercoagulable state due to paroxysmal atrial fibrillation (HCC) 10/21/2022    Right shoulder pain 06/17/2022   Edema of right upper arm 06/17/2022   Hemiparesis affecting right side as late effect of cerebrovascular accident (CVA) (HCC) 04/06/2022   Hypervolemia associated with renal insufficiency 01/29/2022   HLD (hyperlipidemia) 01/29/2022   Polysubstance abuse (HCC) 01/06/2022   Acute on chronic combined systolic and diastolic CHF (congestive heart failure) (HCC) 01/06/2022   History of hepatitis C 12/08/2021   Hypertensive urgency 10/29/2021   Fluid overload 10/29/2021   Acute on chronic HFrEF (heart failure with reduced ejection fraction) (HCC) - BNP does NOT correlate to her level of volume overload 10/29/2021   Chronic anticoagulation 10/29/2021   Prolonged QT interval 10/29/2021   Hypertensive emergency 08/23/2021   History of anemia due to chronic kidney disease 06/05/2021   Chronic combined systolic and diastolic heart failure (HCC) 04/30/2021   Pure hypercholesterolemia 02/14/2021   Metabolic acidosis with increased anion gap and accumulation of organic acids 09/21/2020   Hypertension 09/21/2020   Hyperkalemia 08/28/2019   Paroxysmal SVT (supraventricular tachycardia) (HCC) 08/27/2019   Aortic atherosclerosis (HCC) 07/05/2019   History of CVA (cerebrovascular accident) 05/29/2019   Paroxysmal atrial fibrillation (HCC) 05/29/2019   ESRD (end stage renal disease) (HCC) 05/29/2019   Malnutrition of moderate degree 05/19/2019   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 04/07/2019   Non compliance with medical treatment 12/04/2017   Chronic obstructive lung disease (HCC) 01/16/2017   Chronic low back pain 06/22/2016   Insomnia 03/14/2015   S/P lumbar spinal fusion 01/18/2014   Tobacco use disorder 04/19/2009   Essential hypertension 09/16/2006   GERD 09/16/2006   PCP:  Fleet Contras, MD Pharmacy:   [facility] - [place], [place] - [address] [place] [address] Phone: [phone] Fax:  [phone]     Social Drivers of Health (SDOH) Social History: SDOH Screenings   Food Insecurity: No Food Insecurity (08/31/2023)  Housing: Low Risk  (08/31/2023)  Transportation Needs: No Transportation Needs (08/31/2023)  Utilities: Not At Risk (08/31/2023)  Depression (PHQ2-9): Low Risk  (06/17/2022)  Recent Concern: Depression (PHQ2-9) - Medium Risk (05/22/2022)  Financial Resource Strain: Low Risk  (04/14/2021)   Received from [facility]  Physical Activity: Inactive (04/14/2021)   Received from [facility]  Social Connections: Moderately Isolated (08/31/2023)  Stress: Stress Concern Present (04/14/2021)   Received from [facility]  Tobacco Use: Medium Risk (08/31/2023)   SDOH Interventions:     Readmission Risk Interventions    07/23/2023   12:26 PM 06/03/2023    3:11 PM 01/15/2023   12:35 PM  Readmission Risk Prevention Plan  Transportation Screening Complete Complete Complete  Medication Review (RN Care Manager) Referral to Pharmacy Referral to Pharmacy Referral to Pharmacy  PCP or Specialist appointment within 3-5 days of discharge  Complete Complete  HRI or Home Care Consult Complete Complete Complete  SW Recovery Care/Counseling Consult Complete Complete Complete  Palliative Care Screening Not Applicable Not Applicable Not Applicable  Skilled Nursing Facility Not Applicable Not Applicable Not Applicable

## 2023-09-01 NOTE — Progress Notes (Signed)
 Heart Failure Navigator Progress Note  Assessed for Heart & Vascular TOC clinic readiness.  Patient does not meet criteria due to ESRD on hemodialysis.   Navigator will sign off at this time.   Rhae Hammock, BSN, Scientist, clinical (histocompatibility and immunogenetics) Only

## 2023-09-01 NOTE — Evaluation (Signed)
 Occupational Therapy Evaluation Patient Details Name: Monique Henderson MRN: [id number] DOB: 07/22/1950 Today's Date: 09/01/2023   History of Present Illness   Pt is a 73 y/o F presenting to ED on 4/1 with SOB and slurred speech. CXR with cardiomegaly and pulmonary edema. Admitted for acute hypoxic respiratory failure. Of note, recent admission 3/22-3/24 with SOB/respiratory failure and A fib. PMH includes ESRD on HD TTS, anemia, A fib on Eliquis, HTN, HLD, COPD, breast CA s/p mastectomy, CVA with residual R deficits, low back pain s/p lumbar fusion, Hep C     Clinical Impressions Pt reports ind at baseline with ADLs/functional mobility, lives with spouse who can assist at d/c. Pt uses transportation service to get to/from HD. Pt currently close to baseline needing supervision overall for ADLs and transfers, ind with bed mobility. Pt with residual R weakness from prior CVA, decr grasp/grip strength in R hand. Pt inquired about shower seat for home, educated on use and pt verbalized understanding. Pt presenting with impairments listed below, will follow acutely. Anticipate no OT follow up needs at d/c.      If plan is discharge home, recommend the following:   A little help with bathing/dressing/bathroom;Assistance with cooking/housework;Assist for transportation     Functional Status Assessment   Patient has had a recent decline in their functional status and demonstrates the ability to make significant improvements in function in a reasonable and predictable amount of time.     Equipment Recommendations   Tub/shower seat     Recommendations for Other Services         Precautions/Restrictions   Precautions Precautions: None Restrictions Weight Bearing Restrictions Per Provider Order: No     Mobility Bed Mobility Overal bed mobility: Independent                  Transfers Overall transfer level: Needs assistance Equipment used: None Transfers: Sit to/from  Stand Sit to Stand: Supervision           General transfer comment: supervision for lines/leads      Balance Overall balance assessment: No apparent balance deficits (not formally assessed)                                         ADL either performed or assessed with clinical judgement   ADL Overall ADL's : Needs assistance/impaired Eating/Feeding: Independent   Grooming: Independent;Standing   Upper Body Bathing: Supervision/ safety;Standing   Lower Body Bathing: Supervison/ safety;Sitting/lateral leans   Upper Body Dressing : Supervision/safety;Standing   Lower Body Dressing: Supervision/safety;Sit to/from stand   Toilet Transfer: Supervision/safety;Ambulation   Toileting- Clothing Manipulation and Hygiene: Supervision/safety       Functional mobility during ADLs: Supervision/safety       Vision   Vision Assessment?: No apparent visual deficits     Perception Perception: Not tested       Praxis Praxis: Not tested       Pertinent Vitals/Pain Pain Assessment Pain Assessment: Faces Pain Score: 5  Faces Pain Scale: Hurts little more Pain Location: back and chest area Pain Descriptors / Indicators: Discomfort Pain Intervention(s): Limited activity within patient's tolerance, Monitored during session, Repositioned     Extremity/Trunk Assessment Upper Extremity Assessment Upper Extremity Assessment: Left hand dominant;RUE deficits/detail RUE Deficits / Details: distal v proximal weakness, ~100* shoulder flexion, unabel to form full fist for grasp RUE Coordination: decreased fine motor;decreased gross motor  Lower Extremity Assessment Lower Extremity Assessment: Generalized weakness   Cervical / Trunk Assessment Cervical / Trunk Assessment: Normal   Communication Communication Communication: Other (comment) (slowed speech, pt reports is baseline since CVA 2 years ago)   Cognition Arousal: Alert Behavior During Therapy: WFL for  tasks assessed/performed Cognition: No family/caregiver present to determine baseline             OT - Cognition Comments: pt with hx of CVA, suspect baseline cognition                 Following commands: Intact       Cueing  General Comments   Cueing Techniques: Verbal cues  did not state   Exercises     Shoulder Instructions      Home Living Family/patient expects to be discharged to:: Private residence Living Arrangements: Spouse/significant other Available Help at Discharge: Family;Available PRN/intermittently Type of Home: Apartment (2nd floor) Home Access: Stairs to enter Entrance Stairs-Number of Steps: pt reports flight of stairs Entrance Stairs-Rails: Left Home Layout: One level     Bathroom Shower/Tub: Tub/shower unit;Curtain   Bathroom Toilet: Standard Bathroom Accessibility: Yes   Home Equipment: Agricultural consultant (2 wheels);Cane - single point;BSC/3in1          Prior Functioning/Environment Prior Level of Function : Independent/Modified Independent;Needs assist;History of Falls (last six months)             Mobility Comments: no AD use, 1 fall ADLs Comments: ind, sister assists pt with medications, [place] bus takes pt to HD    OT Problem List: Decreased strength;Decreased range of motion;Decreased activity tolerance;Impaired balance (sitting and/or standing);Impaired UE functional use;Decreased coordination   OT Treatment/Interventions: Self-care/ADL training;Therapeutic exercise;Energy conservation;DME and/or AE instruction;Therapeutic activities;Balance training;Patient/family education      OT Goals(Current goals can be found in the care plan section)   Acute Rehab OT Goals Patient Stated Goal: none stated OT Goal Formulation: With patient Time For Goal Achievement: 09/15/23 Potential to Achieve Goals: Good ADL Goals Pt Will Perform Tub/Shower Transfer: Tub transfer;Shower transfer;Independently;ambulating;shower  seat Pt/caregiver will Perform Home Exercise Program: Increased ROM;Increased strength;Right Upper extremity;With written HEP provided;With Supervision   OT Frequency:  Min 1X/week    Co-evaluation              AM-PAC OT "6 Clicks" Daily Activity     Outcome Measure Help from another person eating meals?: None Help from another person taking care of personal grooming?: None Help from another person toileting, which includes using toliet, bedpan, or urinal?: A Little Help from another person bathing (including washing, rinsing, drying)?: A Little Help from another person to put on and taking off regular upper body clothing?: A Little Help from another person to put on and taking off regular lower body clothing?: A Little 6 Click Score: 20   End of Session Nurse Communication: Mobility status  Activity Tolerance: Patient tolerated treatment well Patient left: in bed;with call bell/phone within reach;with bed alarm set  OT Visit Diagnosis: Unsteadiness on feet (R26.81);Muscle weakness (generalized) (M62.81)                Time: 6578-4696 OT Time Calculation (min): 17 min Charges:  OT General Charges $OT Visit: 1 Visit OT Evaluation $OT Eval Low Complexity: 1 Low  Carver Fila, OTD, OTR/L SecureChat Preferred Acute Rehab [phone]   Carver Fila Koonce 09/01/2023, 4:48 PM

## 2023-09-02 DIAGNOSIS — J9601 Acute respiratory failure with hypoxia: Secondary | ICD-10-CM | POA: Diagnosis not present

## 2023-09-02 LAB — CBC
HCT: 38.3 % (ref 36.0–46.0)
Hemoglobin: 11.9 g/dL — ABNORMAL LOW (ref 12.0–15.0)
MCH: 29.5 pg (ref 26.0–34.0)
MCHC: 31.1 g/dL (ref 30.0–36.0)
MCV: 95 fL (ref 80.0–100.0)
Platelets: 237 10*3/uL (ref 150–400)
RBC: 4.03 MIL/uL (ref 3.87–5.11)
RDW: 18.8 % — ABNORMAL HIGH (ref 11.5–15.5)
WBC: 4.9 10*3/uL (ref 4.0–10.5)
nRBC: 0 % (ref 0.0–0.2)

## 2023-09-02 LAB — BASIC METABOLIC PANEL WITH GFR
Anion gap: 14 (ref 5–15)
BUN: 41 mg/dL — ABNORMAL HIGH (ref 8–23)
CO2: 29 mmol/L (ref 22–32)
Calcium: 9.2 mg/dL (ref 8.9–10.3)
Chloride: 94 mmol/L — ABNORMAL LOW (ref 98–111)
Creatinine, Ser: 9.22 mg/dL — ABNORMAL HIGH (ref 0.44–1.00)
GFR, Estimated: 4 mL/min — ABNORMAL LOW (ref >60)
Glucose, Bld: 105 mg/dL — ABNORMAL HIGH (ref 70–99)
Potassium: 5 mmol/L (ref 3.5–5.1)
Sodium: 137 mmol/L (ref 135–145)

## 2023-09-02 MED ORDER — DIPHENHYDRAMINE HCL 25 MG PO CAPS
25.0000 mg | ORAL_CAPSULE | Freq: Four times a day (QID) | ORAL | Status: AC | PRN
  Administered 2023-09-02: 25 mg via ORAL
  Filled 2023-09-02: qty 1

## 2023-09-02 MED ORDER — HEPARIN SODIUM (PORCINE) 1000 UNIT/ML IJ SOLN
3800.0000 [IU] | Freq: Once | INTRAMUSCULAR | Status: AC
  Administered 2023-09-02: 3800 [IU]
  Filled 2023-09-02: qty 4

## 2023-09-02 MED ORDER — CLONIDINE HCL 0.1 MG PO TABS: 0.1000 mg | ORAL_TABLET | Freq: Two times a day (BID) | ORAL | Status: DC

## 2023-09-02 NOTE — Progress Notes (Signed)
 [facility] Progress Note   Subjective:   Seen on HD. Wants to go home today, it [birth year] her birthday. Denies SOB, CP, dizziness, nausea.   Objective Vitals:   09/02/23 0900 09/02/23 0930 09/02/23 1000 09/02/23 1030  BP: (!) 147/97 (!) 161/89 (!) 155/86 (!) 179/96  Pulse: 63 68 64 74  Resp: 11 18 12 14   Temp:      TempSrc:      SpO2: 100% 100% 100% 100%  Weight:      Height:       Physical Exam  General: Alert female in NAD Heart: RRR, no murmurs, rubs or gallops Lungs: CTA bilaterally, respirations unlabored on O2 via [place] Abdomen: Soft, non-distended, +BS Extremities: No edema b/l lower extremities Dialysis Access:  R internal jugular [facility]  Additional Objective Labs: Basic Metabolic Panel: Recent Labs  Lab 08/31/23 0800 08/31/23 0811 09/01/23 0242 09/02/23 0222  NA 140 136  136 132* 137  K 6.4* 6.1*  6.2* 5.2* 5.0  CL 97* 104 94* 94*  CO2 26  --  26 29  GLUCOSE 126* 124* 112* 105*  BUN 43* 50* 25* 41*  CREATININE 10.46* 11.50* 6.53* 9.22*  CALCIUM 9.9  --  9.2 9.2  PHOS  --   --  6.2*  --    Liver Function Tests: Recent Labs  Lab 08/31/23 0800 09/01/23 0242  AST 24  --   ALT 10  --   ALKPHOS 73  --   BILITOT 0.7  --   PROT 7.9  --   ALBUMIN 4.2 3.4*   No results for input(s): "LIPASE", "AMYLASE" in the last 168 hours. CBC: Recent Labs  Lab 08/31/23 0800 08/31/23 0811 09/01/23 0242 09/02/23 0222  WBC 7.3  --  4.4 4.9  NEUTROABS 5.8  --   --   --   HGB 13.4 15.3*  15.6* 12.0 11.9*  HCT 43.7 45.0  46.0 39.0 38.3  MCV 96.7  --  95.1 95.0  PLT PLATELET CLUMPS NOTED ON SMEAR, UNABLE TO ESTIMATE  --  226 237   Blood Culture    Component Value Date/Time   SDES BLOOD SITE NOT SPECIFIED 07/22/2023 1558   SPECREQUEST  07/22/2023 1558    BOTTLES DRAWN AEROBIC AND ANAEROBIC Blood Culture results may not be optimal due to an inadequate volume of blood received in culture bottles   CULT  07/22/2023 1558    NO GROWTH 5 DAYS Performed at  [facility], [address] [place], [place] [address]    REPTSTATUS 07/27/2023 FINAL 07/22/2023 1558    Cardiac Enzymes: No results for input(s): "CKTOTAL", "CKMB", "CKMBINDEX", "TROPONINI" in the last 168 hours. CBG: No results for input(s): "GLUCAP" in the last 168 hours. Iron Studies: No results for input(s): "IRON", "TIBC", "TRANSFERRIN", "FERRITIN" in the last 72 hours. @lablastinr3 @ Studies/Results: No results found. Medications:   amLODipine  10 mg Oral Daily   apixaban  2.5 mg Oral BID   carvedilol  25 mg Oral BID WC   heparin sodium (porcine)  3,800 Units Intracatheter Once   hydrALAZINE  100 mg Oral Q8H   pantoprazole  40 mg Oral BID   rosuvastatin  10 mg Oral Daily   senna-docusate  1 tablet Oral QHS   sevelamer carbonate  2.4 g Oral TID WC    OP Dialysis Orders:  AF TTS  3.5h  B400  46kg   2k bath RIJ TDC  Heparin none    CXR - IMPRESSION:  Increased  bilateral interstitial and patchy bibasilar opacities, more pronounced on the right, could represent worsening pulmonary edema or infection.   Assessment/Plan: HTNsive urgency: Had not been taking her home meds. Was on nitroglycerin drip. Home meds resumed and BP improved post HD. Lower EDW a bit at discharge ESRD: on HD TTS. May need a lower EDW- will get standing weights today to evaluate Volume: Volume status improved.  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 Continue binders ac tid.     Rogers Blocker, PA-C 09/02/2023, 10:52 AM  [facility] Pager: [phone]

## 2023-09-02 NOTE — Progress Notes (Signed)
Went over discharge paperwork with patient and all questions answered. PIV and telemetry removed. All belongings at bedside.

## 2023-09-02 NOTE — Progress Notes (Addendum)
 PT Cancellation Note  Patient Details Name: Monique Henderson MRN: [id number] DOB: [birth year]   Cancelled Treatment:    Reason Eval/Treat Not Completed: Patient at procedure or test/unavailable (Pt at HD, will re-attempt as schedule allows.)  13:42- Pt declined PT eval at this time. Pt reported being at her functional mobility baseline with no AD. Pt has no questions or concerns about mobility and is scheduled to leave in 20 minutes. Acute PT signing off. Please re-consult if new needs arise.   Hilton Cork, PT, DPT Secure Chat Preferred  Rehab Office [phone]  Arturo Morton Brion [facility] 09/02/2023, 8:10 AM

## 2023-09-02 NOTE — Discharge Summary (Signed)
 Physician Discharge Summary  Monique Henderson ZOX:[id number] DOB: 08-23-1950 DOA: 08/31/2023  PCP: Fleet Contras, MD  Admit date: 08/31/2023 Discharge date: 09/02/2023  Time spent: 45 minutes  Recommendations for Outpatient Follow-up:  PCP in 1 week Continue hemodialysis Tuesday Thursday Saturday   Discharge Diagnoses:  Principal Problem:   Acute hypoxic respiratory failure (HCC) Pulmonary edema ESRD on hemodialysis Hyperkalemia Hypertensive urgency Chronic combined CHF, biventricular failure Hyperglycemia Anemia of chronic disease History of CVA History of COPD   Discharge Condition: Improved  Diet recommendation: Renal  Filed Weights   09/01/23 0706 09/02/23 0434 09/02/23 0746  Weight: 45.9 kg 45.5 kg 45.4 kg    History of present illness:  72/F w ESRD on HD TTS, HFrEF, anemia of ESRD, paroxysmal A fib, hx of CVA, COPD, HTN, HLD, s/p aortic aneurysm repair (2020) presented to the ED with worsening dyspnea, in ED, BP 211/131, tachypneic, hypoxic,  labs noted potassium 6.4, troponin 65 chest x-ray with pulmonary edema. -Placed on nitroglycerin gtt. and BiPAP -Urgent hemodialysis 4/1  Hospital Course:   Acute hypoxic respiratory failure, pulmonary edema ESRD on hemodialysis -Weaned off BiPAP, urgent HD,  improving,  -Weaned off O2 -Discharge home after dialysis today  Hyperkalemia -Improved with HD, she is contained on Lokelma 3 times a week   Hypertensive urgency -Weaned off nitroglycerin gtt. -Continue hydralazine, Coreg and amlodipine -Restart home regimen of clonidine and Imdur   Chronic combined CHF, biventricular failure -Last echo 1/25 with a EF 45%, grade 2 DD, moderately reduced RV -Volume managed with dialysis, continue Coreg   Hyperglycemia -A1c is 4.1   Paroxysmal atrial fibrillation -Currently in sinus rhythm continue Coreg and Eliquis   Elevated troponin secondary to demand ischemia, type II MI -Secondary to pulmonary edema, no ACS    Anemia of chronic disease -Stable   History of CVA -Continue Eliquis and Crestor   History of COPD -Stable, no wheezing at this time, continue albuterol PRN   Discharge Exam: Vitals:   09/02/23 0900 09/02/23 0930  BP: (!) 147/97 (!) 161/89  Pulse: 63 68  Resp: 11 18  Temp:    SpO2: 100% 100%    Discharge Instructions   Discharge Instructions     Diet - low sodium heart healthy   Complete by: As directed    Increase activity slowly   Complete by: As directed    No wound care   Complete by: As directed       Allergies as of 09/02/2023       Reactions   Shrimp [shellfish Allergy] Shortness Of Breath   Bactroban [mupirocin] Other (See Comments)   "Sores in nose"   Chlorhexidine Gluconate Itching   Hydrocodone Itching, Nausea And Vomiting, Nausea Only   Latex Itching   Latex gloves cause itchiness and a rash   Tylenol [acetaminophen] Itching   Takes Percocet at home 05/06/23   Zestril [lisinopril] Cough        Medication List     TAKE these medications    acetaminophen 325 MG tablet Commonly known as: TYLENOL Take 650 mg by mouth every 6 (six) hours as needed for mild pain (pain score 1-3) or moderate pain (pain score 4-6).   albuterol (2.5 MG/3ML) 0.083% nebulizer solution Commonly known as: PROVENTIL Take 3 mLs (2.5 mg total) by nebulization every 6 (six) hours as needed for wheezing or shortness of breath. What changed: Another medication with the same name was removed. Continue taking this medication, and follow the directions you see here.  amLODipine 10 MG tablet Commonly known as: NORVASC Take 1 tablet (10 mg total) by mouth daily. What changed: when to take this   apixaban 2.5 MG Tabs tablet Commonly known as: ELIQUIS Take 1 tablet (2.5 mg total) by mouth 2 (two) times daily.   carvedilol 25 MG tablet Commonly known as: COREG Take 1 tablet (25 mg total) by mouth 2 (two) times daily with a meal.   cinacalcet 60 MG tablet Commonly known  as: SENSIPAR Take 60 mg by mouth every dialysis (Tuesday, Thursday, and Saturday).   cloNIDine 0.1 MG tablet Commonly known as: Catapres Take 1 tablet (0.1 mg total) by mouth 2 (two) times daily. Do not take it before dialysis What changed:  when to take this additional instructions   DOXERCALCIFEROL PO Inject 9 mcg into the vein every dialysis (Tuesday, Thursday, and Saturday).   hydrALAZINE 100 MG tablet Commonly known as: APRESOLINE Take 1 tablet (100 mg total) by mouth every 8 (eight) hours. What changed: when to take this   isosorbide mononitrate 30 MG 24 hr tablet Commonly known as: IMDUR TAKE 1 TABLET (30MG ) ON DIALYSIS DAYS AND 2 TABLETS (60 MG) ON NON DIALYSIS DAYS.   Lokelma 10 g Pack packet Generic drug: sodium zirconium cyclosilicate Take 10 g by mouth every Monday, Wednesday, and Friday.   naloxone 4 MG/0.1ML Liqd nasal spray kit Commonly known as: NARCAN Place 1 spray into the nose as needed (opioid overdose).   oxyCODONE-acetaminophen 10-325 MG tablet Commonly known as: PERCOCET Take 1 tablet by mouth in the morning and at bedtime.   pantoprazole 40 MG tablet Commonly known as: PROTONIX TAKE 1 TABLET (40 MG TOTAL) BY MOUTH 2 (TWO) TIMES DAILY.   rosuvastatin 10 MG tablet Commonly known as: CRESTOR TAKE 1 TABLET (10 MG TOTAL) BY MOUTH DAILY FOR CHOLESTEROL What changed: See the new instructions.   senna-docusate 8.6-50 MG tablet Commonly known as: Senokot-S Take 1 tablet by mouth 2 (two) times daily between meals as needed for mild constipation.   sevelamer carbonate 2.4 g Pack Commonly known as: RENVELA Take 2.4 g by mouth 3 (three) times daily with meals.   traZODone 50 MG tablet Commonly known as: DESYREL Take 25-50 mg by mouth at bedtime as needed for sleep.   Vitamin D-3 125 MCG (5000 UT) Tabs Take 1 tablet by mouth in the morning.       Allergies  Allergen Reactions   Shrimp [Shellfish Allergy] Shortness Of Breath   Bactroban  [Mupirocin] Other (See Comments)    "Sores in nose"   Chlorhexidine Gluconate Itching   Hydrocodone Itching, Nausea And Vomiting and Nausea Only   Latex Itching    Latex gloves cause itchiness and a rash   Tylenol [Acetaminophen] Itching    Takes Percocet at home 05/06/23   Zestril [Lisinopril] Cough      The results of significant diagnostics from this hospitalization (including imaging, microbiology, ancillary and laboratory) are listed below for reference.    Significant Diagnostic Studies: DG Chest Portable 1 View Result Date: 08/31/2023 CLINICAL DATA:  Chest pain. EXAM: PORTABLE CHEST 1 VIEW COMPARISON:  Chest radiograph dated 08/21/2023. FINDINGS: Stable cardiomegaly. Stable right IJ CVC catheter tip projects over the right atrium. Prior median sternotomy. Aortic atherosclerosis. Bilateral interstitial and patchy bibasilar opacities, more pronounced on the right, are increased compared to the prior exam. Mild left basilar atelectasis. No pneumothorax. No acute osseous abnormality. IMPRESSION: 1. Increased bilateral interstitial and patchy bibasilar opacities, more pronounced on the right, could represent  worsening pulmonary edema or infection. Recommend clinical correlation. 2. Stable cardiomegaly. Electronically Signed   By: Hart Robinsons M.D.   On: 08/31/2023 09:55   CT Angio Chest/Abd/Pel for Dissection W and/or Wo Contrast Result Date: 08/21/2023 CLINICAL DATA:  Several day history of cough, chills, and new shortness of breath with hypoxia EXAM: CT ANGIOGRAPHY CHEST, ABDOMEN AND PELVIS TECHNIQUE: Non-contrast CT of the chest was initially obtained. Multidetector CT imaging through the chest, abdomen and pelvis was performed using the standard protocol during bolus administration of intravenous contrast. Multiplanar reconstructed images and MIPs were obtained and reviewed to evaluate the vascular anatomy. RADIATION DOSE REDUCTION: This exam was performed according to the departmental  dose-optimization program which includes automated exposure control, adjustment of the mA and/or kV according to patient size and/or use of iterative reconstruction technique. CONTRAST:  75mL OMNIPAQUE IOHEXOL 350 MG/ML SOLN COMPARISON:  CTA chest, abdomen, and pelvis dated 01/14/2023 and multiple priors FINDINGS: CTA CHEST FINDINGS Cardiovascular: Right IJ central venous catheter terminates in the right atrium. Preferential opacification of the thoracic aorta. Similar postsurgical changes of the ascending aorta measuring 4.6 x 4.1 cm (7:65, remeasured). No evidence of thoracic aortic dissection. Similar multichamber cardiomegaly. No pericardial effusion. Coronary artery calcifications. 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 Reflux of contrast material into the hepatic veins, suggesting a degree of right heart dysfunction. Mediastinum/Nodes: Imaged thyroid gland without nodules meeting criteria for imaging follow-up by size. Normal esophagus. Unchanged 10 mm right hilar lymph node (7:58) and 17 mm subcarinal lymph node (7:62). Lungs/Pleura: The central airways are patent. Diffuse interlobular septal thickening, right-greater-than-left. Patchy right lower lobe ground-glass opacities. Asymmetric right bronchial wall thickening. No pneumothorax. Unchanged moderate right and trace left pleural effusions. Musculoskeletal: No acute or abnormal lytic or blastic osseous lesions. Median sternotomy wires are nondisplaced. Multilevel degenerative changes of the thoracic spine. Flowing anterior osteophytes over at least 4 contiguous thoracic vertebrae which can be seen in the setting of diffuse idiopathic skeletal hyperostosis (DISH). Review of the MIP images confirms the above findings. CTA ABDOMEN AND PELVIS FINDINGS VASCULAR Severe aortic and branch atherosclerosis. Normal caliber of the abdominal  aorta. No evidence of aortic aneurysm, dissection, or acute aortic abnormality. NON-VASCULAR Hepatobiliary: No focal hepatic lesions. No intra or extrahepatic biliary ductal dilation. Normal gallbladder. Pancreas: No focal lesions or main ductal dilation. Spleen: Normal in size without focal abnormality. Adrenals/Urinary Tract: No adrenal nodules. Atrophic kidneys without hydronephrosis. 2.6 cm exophytic right upper pole hypodensity (7:157), increased in size from 1.8 cm on 11/18/2021. Simple-appearing cyst in the lower pole left kidney and subcentimeter hyperdense cyst in the upper pole. No specific follow-up imaging recommended. No focal bladder wall thickening. Stomach/Bowel: Normal appearance of the stomach. No evidence of bowel wall thickening, distention, or inflammatory changes. Colonic diverticulosis without acute diverticulitis. Normal appendix. Lymphatic: No enlarged abdominal or pelvic lymph nodes. Reproductive: No adnexal masses. Multifocal coarse calcifications within the uterus, likely leiomyomata. Other: Small volume free fluid.  No free air or fluid collection. Musculoskeletal: No acute or abnormal lytic or blastic osseous lesions. Multilevel degenerative changes of the lumbar spine. Postsurgical changes of L4-5 superior spinous process and L2-3 fusion. Unchanged fat containing left lumbar hernias. Mild body wall edema. Review of the MIP images confirms the above findings. IMPRESSION: 1. No evidence of aortic dissection. Similar postsurgical changes of the ascending aorta. 2. Similar multichamber cardiomegaly with asymmetric right pulmonary edema and unchanged  moderate right and trace left pleural effusions. 3. Patchy right lower lobe ground-glass opacities, which may be superimposed infection or inflammation. 4. Unchanged mildly enlarged right hilar and subcarinal lymph nodes, likely reactive. 5. Increased size of a 2.6 cm exophytic right upper pole renal hypodensity. Recommend further nonemergent  characterization. While renal protocol MRI or CT are preferred, in the setting of renal failure, renal ultrasound may be pursued first. 6. Aortic Atherosclerosis (ICD10-I70.0). Coronary artery calcifications. Assessment for potential risk factor modification, dietary therapy or pharmacologic therapy may be warranted, if clinically indicated. Electronically Signed   By: Agustin Cree M.D.   On: 08/21/2023 10:42   DG Chest Port 1 View Result Date: 08/21/2023 CLINICAL DATA:  Cough EXAM: PORTABLE CHEST 1 VIEW COMPARISON:  Chest radiograph dated 08/09/2023 FINDINGS: Lines/tubes: Right internal jugular venous catheter tip projects over the right atrium. Surgical clips project over the right axilla. Lungs: Patient is rotated slightly to the left. Well inflated lungs. Similar interstitial opacities and patchy bibasilar opacities, right-greater-than-left. Pleura: No pneumothorax or pleural effusion. Heart/mediastinum: Similar markedly enlarged cardiomediastinal silhouette. Bones: Median sternotomy wires are nondisplaced. IMPRESSION: 1. Similar interstitial opacities and patchy bibasilar opacities, right-greater-than-left, which may represent pulmonary edema or infection. 2. Similar marked cardiomegaly. Electronically Signed   By: Agustin Cree M.D.   On: 08/21/2023 08:40   DG Elbow Complete Right Result Date: 08/10/2023 CLINICAL DATA:  Elbow pain post fall rt humerus performed earlier Pain post fall EXAM: RIGHT ELBOW - COMPLETE 3+ VIEW COMPARISON:  None Available. FINDINGS: There is no evidence of fracture, dislocation, or joint effusion. There is no evidence of severe arthropathy or other focal bone abnormality. Soft tissues are unremarkable. IMPRESSION: No acute displaced fracture or dislocation. Electronically Signed   By: Tish Frederickson M.D.   On: 08/10/2023 00:26   CT Cervical Spine Wo Contrast Result Date: 08/10/2023 CLINICAL DATA:  Neck trauma (Age >= 65y); Back trauma, no prior imaging (Age >= 16y) Fall onto back  EXAM: CT CERVICAL, THORACIC, AND LUMBAR SPINE WITHOUT CONTRAST TECHNIQUE: Multidetector CT imaging of the cervical, thoracic and lumbar spine was performed without intravenous contrast. Multiplanar CT image reconstructions were also generated. RADIATION DOSE REDUCTION: This exam was performed according to the departmental dose-optimization program which includes automated exposure control, adjustment of the mA and/or kV according to patient size and/or use of iterative reconstruction technique. COMPARISON:  Chest x-ray 08/09/2023 FINDINGS: CT CERVICAL SPINE FINDINGS Alignment: Normal. Skull base and vertebrae: Multilevel severe degenerative changes of the spine. No associated severe osseous foraminal or central canal stenosis. No acute fracture. No aggressive appearing focal osseous lesion or focal pathologic process. Soft tissues and spinal canal: No prevertebral fluid or swelling. No visible canal hematoma. Other: None. CT THORACIC SPINE FINDINGS Alignment: Normal. Vertebrae: Multilevel moderate degenerative changes of the spine. No associated severe osseous neural foraminal or central canal stenosis. No acute fracture or focal pathologic process. Paraspinal and other soft tissues: Negative. Disc levels: Maintained. CT LUMBAR SPINE FINDINGS Segmentation: 5 lumbar type vertebrae. Alignment: Normal. Vertebrae: No acute fracture or focal pathologic process. L2-L3 left and interbody surgical hardware fusion. L4-L5 multilevel moderate degenerative changes of the spine. No CT evidence of surgical hardware complication. No associated severe osseous neural foraminal or central canal stenosis. Posterior element surgical hardware. Paraspinal and other soft tissues: Negative. Disc levels: Multilevel intervertebral disc space vacuum phenomenon. Other: Trace to small right pleural effusion. Limited evaluation of the lungs due to motion artifact. Diffuse bronchial wall thickening. Question developing peribronchovascular  ground-glass airspace  opacities. Cardiomegaly. 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 Severe atherosclerotic plaque. Right chest wall dialysis catheter with tip within the right atrium in the region of the inferior cavoatrial junction. Calcified uterine fibroids. IMPRESSION: 1. No acute displaced fracture or traumatic listhesis of the cervical, thoracic, lumbar spine. 2. Trace to small right pleural effusion. Not well visualized on chest x-ray 08/09/2023. 3. Diffuse bronchial wall thickening. Question developing peribronchovascular ground-glass airspace opacities. Limited evaluation due to motion artifact. Developing infection/inflammation not excluded. 4.  Aortic Atherosclerosis (ICD10-I70.0). 5. 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 Recommend annual imaging followup by CTA or MRA. This recommendation follows 2010 ACCF/AHA/AATS/ACR/ASA/SCA/SCAI/SIR/STS/SVM Guidelines for the Diagnosis and Management of Patients with Thoracic Aortic Disease. Circulation. 2010; 121: Z610-R604. Aortic aneurysm NOS (ICD10-I71.9). Electronically Signed   By: Tish Frederickson M.D.   On: 08/10/2023 00:25   CT Thoracic Spine Wo Contrast Result Date: 08/10/2023 CLINICAL DATA:  Neck trauma (Age >= 65y); Back trauma, no prior imaging (Age >= 16y) Fall onto back EXAM: CT CERVICAL, THORACIC, AND LUMBAR SPINE WITHOUT CONTRAST TECHNIQUE: Multidetector CT imaging of the cervical, thoracic and lumbar spine was performed without intravenous contrast. Multiplanar CT image reconstructions were also generated. RADIATION DOSE REDUCTION: This exam was performed according to the departmental dose-optimization program which includes automated exposure control, adjustment of the mA and/or kV according to patient size and/or use of iterative reconstruction technique. COMPARISON:  Chest x-ray 08/09/2023 FINDINGS: CT CERVICAL SPINE FINDINGS Alignment: Normal. Skull base and vertebrae: Multilevel severe degenerative changes of the  spine. No associated severe osseous foraminal or central canal stenosis. No acute fracture. No aggressive appearing focal osseous lesion or focal pathologic process. Soft tissues and spinal canal: No prevertebral fluid or swelling. No visible canal hematoma. Other: None. CT THORACIC SPINE FINDINGS Alignment: Normal. Vertebrae: Multilevel moderate degenerative changes of the spine. No associated severe osseous neural foraminal or central canal stenosis. No acute fracture or focal pathologic process. Paraspinal and other soft tissues: Negative. Disc levels: Maintained. CT LUMBAR SPINE FINDINGS Segmentation: 5 lumbar type vertebrae. Alignment: Normal. Vertebrae: No acute fracture or focal pathologic process. L2-L3 left and interbody surgical hardware fusion. L4-L5 multilevel moderate degenerative changes of the spine. No CT evidence of surgical hardware complication. No associated severe osseous neural foraminal or central canal stenosis. Posterior element surgical hardware. Paraspinal and other soft tissues: Negative. Disc levels: Multilevel intervertebral disc space vacuum phenomenon. Other: Trace to small right pleural effusion. Limited evaluation of the lungs due to motion artifact. Diffuse bronchial wall thickening. Question developing peribronchovascular ground-glass airspace opacities. Cardiomegaly. 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 Severe atherosclerotic plaque. Right chest wall dialysis catheter with tip within the right atrium in the region of the inferior cavoatrial junction. Calcified uterine fibroids. IMPRESSION: 1. No acute displaced fracture or traumatic listhesis of the cervical, thoracic, lumbar spine. 2. Trace to small right pleural effusion. Not well visualized on chest x-ray 08/09/2023. 3. Diffuse bronchial wall thickening. Question developing peribronchovascular ground-glass airspace opacities. Limited evaluation due to motion artifact. Developing infection/inflammation  not excluded. 4.  Aortic Atherosclerosis (ICD10-I70.0). 5. 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 Recommend annual imaging followup by CTA or MRA. This recommendation follows 2010 ACCF/AHA/AATS/ACR/ASA/SCA/SCAI/SIR/STS/SVM Guidelines for the Diagnosis and Management of Patients with Thoracic Aortic Disease. Circulation. 2010; 121: V409-W119. Aortic aneurysm NOS (ICD10-I71.9). Electronically Signed   By: Tish Frederickson M.D.   On: 08/10/2023 00:25   CT Lumbar Spine Wo Contrast Result Date: 08/10/2023 CLINICAL DATA:  Neck trauma (Age >= 65y);  Back trauma, no prior imaging (Age >= 16y) Fall onto back EXAM: CT CERVICAL, THORACIC, AND LUMBAR SPINE WITHOUT CONTRAST TECHNIQUE: Multidetector CT imaging of the cervical, thoracic and lumbar spine was performed without intravenous contrast. Multiplanar CT image reconstructions were also generated. RADIATION DOSE REDUCTION: This exam was performed according to the departmental dose-optimization program which includes automated exposure control, adjustment of the mA and/or kV according to patient size and/or use of iterative reconstruction technique. COMPARISON:  Chest x-ray 08/09/2023 FINDINGS: CT CERVICAL SPINE FINDINGS Alignment: Normal. Skull base and vertebrae: Multilevel severe degenerative changes of the spine. No associated severe osseous foraminal or central canal stenosis. No acute fracture. No aggressive appearing focal osseous lesion or focal pathologic process. Soft tissues and spinal canal: No prevertebral fluid or swelling. No visible canal hematoma. Other: None. CT THORACIC SPINE FINDINGS Alignment: Normal. Vertebrae: Multilevel moderate degenerative changes of the spine. No associated severe osseous neural foraminal or central canal stenosis. No acute fracture or focal pathologic process. Paraspinal and other soft tissues: Negative. Disc levels: Maintained. CT LUMBAR SPINE FINDINGS Segmentation: 5 lumbar type vertebrae. Alignment: Normal. Vertebrae: No  acute fracture or focal pathologic process. L2-L3 left and interbody surgical hardware fusion. L4-L5 multilevel moderate degenerative changes of the spine. No CT evidence of surgical hardware complication. No associated severe osseous neural foraminal or central canal stenosis. Posterior element surgical hardware. Paraspinal and other soft tissues: Negative. Disc levels: Multilevel intervertebral disc space vacuum phenomenon. Other: Trace to small right pleural effusion. Limited evaluation of the lungs due to motion artifact. Diffuse bronchial wall thickening. Question developing peribronchovascular ground-glass airspace opacities. Cardiomegaly. 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 Severe atherosclerotic plaque. Right chest wall dialysis catheter with tip within the right atrium in the region of the inferior cavoatrial junction. Calcified uterine fibroids. IMPRESSION: 1. No acute displaced fracture or traumatic listhesis of the cervical, thoracic, lumbar spine. 2. Trace to small right pleural effusion. Not well visualized on chest x-ray 08/09/2023. 3. Diffuse bronchial wall thickening. Question developing peribronchovascular ground-glass airspace opacities. Limited evaluation due to motion artifact. Developing infection/inflammation not excluded. 4.  Aortic Atherosclerosis (ICD10-I70.0). 5. 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 Recommend annual imaging followup by CTA or MRA. This recommendation follows 2010 ACCF/AHA/AATS/ACR/ASA/SCA/SCAI/SIR/STS/SVM Guidelines for the Diagnosis and Management of Patients with Thoracic Aortic Disease. Circulation. 2010; 121: Z610-R604. Aortic aneurysm NOS (ICD10-I71.9). Electronically Signed   By: Tish Frederickson M.D.   On: 08/10/2023 00:25   CT Head Wo Contrast Result Date: 08/10/2023 CLINICAL DATA:  Head trauma, minor (Age >= 65y).  Fall onto back. EXAM: CT HEAD WITHOUT CONTRAST TECHNIQUE: Contiguous axial images were obtained from the base of the  skull through the vertex without intravenous contrast. RADIATION DOSE REDUCTION: This exam was performed according to the departmental dose-optimization program which includes automated exposure control, adjustment of the mA and/or kV according to patient size and/or use of iterative reconstruction technique. COMPARISON:  CT head 06/25/2023 FINDINGS: Brain: Cerebral ventricle sizes are concordant with the degree of cerebral volume loss. Chronic extensive patchy and confluent areas of decreased attenuation are noted throughout the deep and periventricular white matter of the cerebral hemispheres bilaterally, compatible with chronic microvascular ischemic disease. Left frontoparietal encephalomalacia. No evidence of large-territorial acute infarction. No parenchymal hemorrhage. No mass lesion. No extra-axial collection. No mass effect or midline shift. No hydrocephalus. Basilar cisterns are patent. Vascular: No hyperdense vessel. Atherosclerotic calcifications are present within the cavernous internal carotid arteries. Skull: No acute fracture or focal lesion. Sinuses/Orbits: Paranasal sinuses  and mastoid air cells are clear. The orbits are unremarkable. Other: None. IMPRESSION: No acute intracranial abnormality. Electronically Signed   By: Tish Frederickson M.D.   On: 08/10/2023 00:10   DG Humerus Right Result Date: 08/09/2023 CLINICAL DATA:  90955 Arm pain 21308 ShOB x 1 hour and hypertension. Pt is a dialysis pt with last full treatment on Saturday. Hx of stroke with R sided deficits." Best images obtainable d/t pt condition. EXAM: RIGHT HUMERUS - 2+ VIEW COMPARISON:  None Available. FINDINGS: Likely elbow joint effusion. There is no evidence of fracture or other focal bone lesions. Soft tissues are unremarkable. IMPRESSION: 1. Suggestion of an elbow joint effusion. Recommend dedicated 3 view radiograph of the elbow. 2.  No acute displaced fracture of the humerus. Electronically Signed   By: Tish Frederickson M.D.    On: 08/09/2023 23:24   DG Chest 2 View Result Date: 08/09/2023 CLINICAL DATA:  shob EXAM: CHEST - 2 VIEW COMPARISON:  CT chest 01/14/2023, chest x-ray 07/22/2023 FINDINGS: Right chest wall dialysis catheter with tip overlying the right atrium in the region of the inferior caval junction. Cardiomegaly. The heart and mediastinal contours are unchanged. Atherosclerotic plaque. Lingular atelectasis. No focal consolidation. Chronic coarse markings with no overt pulmonary edema. No pleural effusion. No pneumothorax. No acute osseous abnormality.  Sternotomy wires are intact. IMPRESSION: 1. No active cardiopulmonary disease. 2. Aortic Atherosclerosis (ICD10-I70.0) and Emphysema (ICD10-J43.9). Electronically Signed   By: Tish Frederickson M.D.   On: 08/09/2023 23:21    Microbiology: Recent Results (from the past 240 hours)  Resp panel by RT-PCR (RSV, Flu A&B, Covid) Anterior Nasal Swab     Status: None   Collection Time: 08/31/23  7:38 AM   Specimen: Anterior Nasal Swab  Result Value Ref Range Status   SARS Coronavirus 2 by RT PCR NEGATIVE NEGATIVE Final   Influenza A by PCR NEGATIVE NEGATIVE Final   Influenza B by PCR NEGATIVE NEGATIVE Final    Comment: (NOTE) The Xpert Xpress SARS-CoV-2/FLU/RSV plus assay is intended as an aid in the diagnosis of influenza from Nasopharyngeal swab specimens and should not be used as a sole basis for treatment. Nasal washings and aspirates are unacceptable for Xpert Xpress SARS-CoV-2/FLU/RSV testing.  Fact Sheet for Patients: BloggerCourse.com  Fact Sheet for Healthcare Providers: SeriousBroker.it  This test is not yet approved or cleared by the [place] FDA and has been authorized for detection and/or diagnosis of SARS-CoV-2 by FDA under an Emergency Use Authorization (EUA). This EUA will remain in effect (meaning this test can be used) for the duration of the COVID-19 declaration under Section 564(b)(1)  of the Act, 21 U.S.C. section 360bbb-3(b)(1), unless the authorization is terminated or revoked.     Resp Syncytial Virus by PCR NEGATIVE NEGATIVE Final    Comment: (NOTE) Fact Sheet for Patients: BloggerCourse.com  Fact Sheet for Healthcare Providers: SeriousBroker.it  This test is not yet approved or cleared by the [place] FDA and has been authorized for detection and/or diagnosis of SARS-CoV-2 by FDA under an Emergency Use Authorization (EUA). This EUA will remain in effect (meaning this test can be used) for the duration of the COVID-19 declaration under Section 564(b)(1) of the Act, 21 U.S.C. section 360bbb-3(b)(1), unless the authorization is terminated or revoked.  Performed at [facility], [address], [place], [place] [address]   MRSA Next Gen by PCR, Nasal     Status: None   Collection Time: 08/31/23  6:37 PM   Specimen: Nasal  Mucosa; Nasal Swab  Result Value Ref Range Status   MRSA by PCR Next Gen NOT DETECTED NOT DETECTED Final    Comment: (NOTE) The GeneXpert MRSA Assay (FDA approved for NASAL specimens only), is one component of a comprehensive MRSA colonization surveillance program. It is not intended to diagnose MRSA infection nor to guide or monitor treatment for MRSA infections. Test performance is not FDA approved in patients less than 44 years old. Performed at [facility], [address], [place], [place] [address]      Labs: Basic Metabolic Panel: Recent Labs  Lab 08/31/23 0800 08/31/23 0811 09/01/23 0242 09/02/23 0222  NA 140 136  136 132* 137  K 6.4* 6.1*  6.2* 5.2* 5.0  CL 97* 104 94* 94*  CO2 26  --  26 29  GLUCOSE 126* 124* 112* 105*  BUN 43* 50* 25* 41*  CREATININE 10.46* 11.50* 6.53* 9.22*  CALCIUM 9.9  --  9.2 9.2  PHOS  --   --  6.2*  --    Liver Function Tests: Recent Labs  Lab 08/31/23 0800 09/01/23 0242  AST 24  --   ALT 10  --   ALKPHOS 73  --    BILITOT 0.7  --   PROT 7.9  --   ALBUMIN 4.2 3.4*   No results for input(s): "LIPASE", "AMYLASE" in the last 168 hours. No results for input(s): "AMMONIA" in the last 168 hours. CBC: Recent Labs  Lab 08/31/23 0800 08/31/23 0811 09/01/23 0242 09/02/23 0222  WBC 7.3  --  4.4 4.9  NEUTROABS 5.8  --   --   --   HGB 13.4 15.3*  15.6* 12.0 11.9*  HCT 43.7 45.0  46.0 39.0 38.3  MCV 96.7  --  95.1 95.0  PLT PLATELET CLUMPS NOTED ON SMEAR, UNABLE TO ESTIMATE  --  226 237   Cardiac Enzymes: No results for input(s): "CKTOTAL", "CKMB", "CKMBINDEX", "TROPONINI" in the last 168 hours. BNP: BNP (last 3 results) Recent Labs    06/18/23 0334 08/21/23 0823 08/31/23 0800  BNP >4,500.0* >4,500.0* >4,500.0*    ProBNP (last 3 results) No results for input(s): "PROBNP" in the last 8760 hours.  CBG: No results for input(s): "GLUCAP" in the last 168 hours.     Signed:  Zannie Cove MD.  [facility] 09/02/2023, 9:48 AM

## 2023-09-02 NOTE — Progress Notes (Signed)
 D/C order noted. Contacted [facility] to be advised of pt's d/c today and that pt should resume care on Saturday.   Olivia Canter Renal Navigator [phone]

## 2023-09-02 NOTE — Procedures (Addendum)
 Received patient in bed to unit.  Alert and oriented.  Informed consent signed and in chart.   TX duration: 3.5 hours  Patient tolerated well.  Transported back to the room  Alert, without acute distress.  Hand-off given to patient's nurse.   Access used: right cath Access issues: none  Total UF removed: 3500 ml-PA notified of fluid removal amount Medication(s) given: hydralazine   Lu Duffel, RN [facility]

## 2023-09-03 NOTE — Discharge Planning (Signed)
 [facility] Patient Discharge Orders- [facility] CLINIC: [facility]  Patient's name: Monique Henderson Admit/DC Dates: 08/31/2023 - 09/02/2023  Discharge Diagnoses: HTN urgency  ESRD  Aranesp: Given: no   Date and amount of last dose: N/A  Last Hgb: 11.9 PRBC's Given: no Date/# of units: N/A ESA dose for discharge: none IV Iron dose at discharge: no  Heparin change: no  EDW Change: Yes New EDW: 42.5kg  Bath Change: no  Access intervention/Change: no Details:  Hectorol/Calcitriol change: no  Discharge Labs: Calcium 9.2 Phosphorus 6.2 Albumin 3.4 K+ 5.0  IV Antibiotics: no Details:  On Coumadin?: no Last INR: Next INR: Managed By:   OTHER/APPTS/LAB ORDERS:    D/C Meds to be reconciled by nurse after every discharge.  Completed By: Rogers Blocker, PA-C 09/03/2023, 7:58 AM  [facility] Pager: [phone]    Reviewed by: MD:______ RN_______

## 2023-09-04 ENCOUNTER — Telehealth: Payer: Self-pay | Admitting: Nephrology

## 2023-09-04 NOTE — Telephone Encounter (Signed)
 Transition of care contact from inpatient facility  Date of Discharge: 09/02/23 Date of Contact:attempted 09/04/23 Method of contact: Phone  Attempted to contact patient to discuss transition of care from inpatient admission. Patient did not answer the phone. Message not able to be made on  patient's voicemail , will see at OP kidney center for further  discussion .

## 2023-09-15 ENCOUNTER — Other Ambulatory Visit: Payer: Self-pay | Admitting: Family Medicine

## 2023-09-17 ENCOUNTER — Ambulatory Visit
Admission: RE | Admit: 2023-09-17 | Discharge: 2023-09-17 | Disposition: A | Discharge status: Home / Self Care | Source: Ambulatory Visit | Attending: Internal Medicine | Admitting: Internal Medicine

## 2023-09-17 ENCOUNTER — Other Ambulatory Visit: Payer: Self-pay | Admitting: Internal Medicine

## 2023-09-17 DIAGNOSIS — Z1231 Encounter for screening mammogram for malignant neoplasm of breast: Secondary | ICD-10-CM

## 2023-09-17 DIAGNOSIS — Q839 Congenital malformation of breast, unspecified: Secondary | ICD-10-CM

## 2023-09-21 ENCOUNTER — Emergency Department (HOSPITAL_COMMUNITY)

## 2023-09-21 ENCOUNTER — Inpatient Hospital Stay (HOSPITAL_COMMUNITY)
Admission: EM | Admit: 2023-09-21 | Discharge: 2023-09-24 | DRG: 640 | Disposition: A | Discharge status: Home / Self Care | Attending: Internal Medicine | Admitting: Internal Medicine

## 2023-09-21 ENCOUNTER — Encounter (HOSPITAL_COMMUNITY): Payer: Self-pay

## 2023-09-21 DIAGNOSIS — Z888 Allergy status to other drugs, medicaments and biological substances status: Secondary | ICD-10-CM

## 2023-09-21 DIAGNOSIS — J9601 Acute respiratory failure with hypoxia: Secondary | ICD-10-CM | POA: Diagnosis present

## 2023-09-21 DIAGNOSIS — Z853 Personal history of malignant neoplasm of breast: Secondary | ICD-10-CM

## 2023-09-21 DIAGNOSIS — Z87891 Personal history of nicotine dependence: Secondary | ICD-10-CM

## 2023-09-21 DIAGNOSIS — N186 End stage renal disease: Secondary | ICD-10-CM | POA: Diagnosis present

## 2023-09-21 DIAGNOSIS — Z86000 Personal history of in-situ neoplasm of breast: Secondary | ICD-10-CM

## 2023-09-21 DIAGNOSIS — I5042 Chronic combined systolic (congestive) and diastolic (congestive) heart failure: Secondary | ICD-10-CM | POA: Diagnosis present

## 2023-09-21 DIAGNOSIS — K219 Gastro-esophageal reflux disease without esophagitis: Secondary | ICD-10-CM | POA: Diagnosis present

## 2023-09-21 DIAGNOSIS — Z8249 Family history of ischemic heart disease and other diseases of the circulatory system: Secondary | ICD-10-CM

## 2023-09-21 DIAGNOSIS — E8770 Fluid overload, unspecified: Principal | ICD-10-CM | POA: Diagnosis present

## 2023-09-21 DIAGNOSIS — I69351 Hemiplegia and hemiparesis following cerebral infarction affecting right dominant side: Secondary | ICD-10-CM

## 2023-09-21 DIAGNOSIS — Z981 Arthrodesis status: Secondary | ICD-10-CM

## 2023-09-21 DIAGNOSIS — Z91158 Patient's noncompliance with renal dialysis for other reason: Secondary | ICD-10-CM

## 2023-09-21 DIAGNOSIS — Z881 Allergy status to other antibiotic agents status: Secondary | ICD-10-CM

## 2023-09-21 DIAGNOSIS — I4892 Unspecified atrial flutter: Secondary | ICD-10-CM | POA: Diagnosis present

## 2023-09-21 DIAGNOSIS — Z1152 Encounter for screening for COVID-19: Secondary | ICD-10-CM

## 2023-09-21 DIAGNOSIS — Z79899 Other long term (current) drug therapy: Secondary | ICD-10-CM

## 2023-09-21 DIAGNOSIS — Z886 Allergy status to analgesic agent status: Secondary | ICD-10-CM

## 2023-09-21 DIAGNOSIS — E785 Hyperlipidemia, unspecified: Secondary | ICD-10-CM | POA: Diagnosis present

## 2023-09-21 DIAGNOSIS — I132 Hypertensive heart and chronic kidney disease with heart failure and with stage 5 chronic kidney disease, or end stage renal disease: Secondary | ICD-10-CM | POA: Diagnosis present

## 2023-09-21 DIAGNOSIS — Z833 Family history of diabetes mellitus: Secondary | ICD-10-CM

## 2023-09-21 DIAGNOSIS — Z800 Family history of malignant neoplasm of digestive organs: Secondary | ICD-10-CM

## 2023-09-21 DIAGNOSIS — I161 Hypertensive emergency: Secondary | ICD-10-CM | POA: Diagnosis present

## 2023-09-21 DIAGNOSIS — D631 Anemia in chronic kidney disease: Secondary | ICD-10-CM | POA: Diagnosis present

## 2023-09-21 DIAGNOSIS — Z992 Dependence on renal dialysis: Secondary | ICD-10-CM

## 2023-09-21 DIAGNOSIS — Z5982 Transportation insecurity: Secondary | ICD-10-CM

## 2023-09-21 DIAGNOSIS — R54 Age-related physical debility: Secondary | ICD-10-CM | POA: Diagnosis present

## 2023-09-21 DIAGNOSIS — Z91040 Latex allergy status: Secondary | ICD-10-CM

## 2023-09-21 DIAGNOSIS — Z885 Allergy status to narcotic agent status: Secondary | ICD-10-CM

## 2023-09-21 DIAGNOSIS — I10 Essential (primary) hypertension: Secondary | ICD-10-CM

## 2023-09-21 DIAGNOSIS — I480 Paroxysmal atrial fibrillation: Secondary | ICD-10-CM | POA: Diagnosis present

## 2023-09-21 DIAGNOSIS — R0602 Shortness of breath: Secondary | ICD-10-CM | POA: Diagnosis not present

## 2023-09-21 DIAGNOSIS — Z91013 Allergy to seafood: Secondary | ICD-10-CM

## 2023-09-21 DIAGNOSIS — Z820 Family history of epilepsy and other diseases of the nervous system: Secondary | ICD-10-CM

## 2023-09-21 DIAGNOSIS — J449 Chronic obstructive pulmonary disease, unspecified: Secondary | ICD-10-CM | POA: Diagnosis present

## 2023-09-21 DIAGNOSIS — E875 Hyperkalemia: Principal | ICD-10-CM

## 2023-09-21 DIAGNOSIS — I509 Heart failure, unspecified: Secondary | ICD-10-CM

## 2023-09-21 DIAGNOSIS — Z7901 Long term (current) use of anticoagulants: Secondary | ICD-10-CM

## 2023-09-21 LAB — BASIC METABOLIC PANEL WITH GFR
Anion gap: 21 — ABNORMAL HIGH (ref 5–15)
BUN: 64 mg/dL — ABNORMAL HIGH (ref 8–23)
CO2: 18 mmol/L — ABNORMAL LOW (ref 22–32)
Calcium: 9.3 mg/dL (ref 8.9–10.3)
Chloride: 98 mmol/L (ref 98–111)
Creatinine, Ser: 11.1 mg/dL — ABNORMAL HIGH (ref 0.44–1.00)
GFR, Estimated: 3 mL/min — ABNORMAL LOW (ref >60)
Glucose, Bld: 104 mg/dL — ABNORMAL HIGH (ref 70–99)
Potassium: 6.8 mmol/L (ref 3.5–5.1)
Sodium: 137 mmol/L (ref 135–145)

## 2023-09-21 LAB — CBC WITH DIFFERENTIAL/PLATELET
Abs Immature Granulocytes: 0.02 10*3/uL (ref 0.00–0.07)
Basophils Absolute: 0.1 10*3/uL (ref 0.0–0.1)
Basophils Relative: 1 %
Eosinophils Absolute: 0.2 10*3/uL (ref 0.0–0.5)
Eosinophils Relative: 3 %
HCT: 43.7 % (ref 36.0–46.0)
Hemoglobin: 12.9 g/dL (ref 12.0–15.0)
Immature Granulocytes: 0 %
Lymphocytes Relative: 7 %
Lymphs Abs: 0.5 10*3/uL — ABNORMAL LOW (ref 0.7–4.0)
MCH: 29.3 pg (ref 26.0–34.0)
MCHC: 29.5 g/dL — ABNORMAL LOW (ref 30.0–36.0)
MCV: 99.3 fL (ref 80.0–100.0)
Monocytes Absolute: 0.7 10*3/uL (ref 0.1–1.0)
Monocytes Relative: 9 %
Neutro Abs: 6.2 10*3/uL (ref 1.7–7.7)
Neutrophils Relative %: 80 %
Platelets: 172 10*3/uL (ref 150–400)
RBC: 4.4 MIL/uL (ref 3.87–5.11)
RDW: 17.5 % — ABNORMAL HIGH (ref 11.5–15.5)
WBC: 7.7 10*3/uL (ref 4.0–10.5)
nRBC: 0 % (ref 0.0–0.2)

## 2023-09-21 LAB — BRAIN NATRIURETIC PEPTIDE: B Natriuretic Peptide: >4500 pg/mL — ABNORMAL HIGH (ref 0.0–100.0)

## 2023-09-21 LAB — TROPONIN I (HIGH SENSITIVITY): Troponin I (High Sensitivity): 73 ng/L — ABNORMAL HIGH (ref <18)

## 2023-09-21 LAB — HEPATITIS B SURFACE ANTIGEN: Hepatitis B Surface Ag: NONREACTIVE

## 2023-09-21 MED ORDER — DEXTROSE 50 % IV SOLN
1.0000 | Freq: Once | INTRAVENOUS | Status: AC
  Administered 2023-09-21: 50 mL via INTRAVENOUS
  Filled 2023-09-21: qty 50

## 2023-09-21 MED ORDER — SODIUM BICARBONATE 8.4 % IV SOLN
50.0000 meq | Freq: Once | INTRAVENOUS | Status: AC
  Administered 2023-09-21: 50 meq via INTRAVENOUS
  Filled 2023-09-21: qty 50

## 2023-09-21 MED ORDER — SODIUM ZIRCONIUM CYCLOSILICATE 10 G PO PACK
10.0000 g | PACK | Freq: Once | ORAL | Status: AC
  Administered 2023-09-21: 10 g via ORAL
  Filled 2023-09-21: qty 1

## 2023-09-21 MED ORDER — INSULIN ASPART 100 UNIT/ML IV SOLN
5.0000 [IU] | Freq: Once | INTRAVENOUS | Status: AC
  Administered 2023-09-21: 5 [IU] via INTRAVENOUS

## 2023-09-21 MED ORDER — OXYCODONE HCL 5 MG PO TABS
10.0000 mg | ORAL_TABLET | Freq: Once | ORAL | Status: AC
  Administered 2023-09-21: 10 mg via ORAL
  Filled 2023-09-21: qty 2

## 2023-09-21 MED ORDER — FUROSEMIDE 10 MG/ML IJ SOLN
40.0000 mg | Freq: Once | INTRAMUSCULAR | Status: AC
  Administered 2023-09-21: 40 mg via INTRAVENOUS
  Filled 2023-09-21: qty 4

## 2023-09-21 MED ORDER — HYDRALAZINE HCL 20 MG/ML IJ SOLN
20.0000 mg | Freq: Once | INTRAMUSCULAR | Status: DC
Start: 2023-09-21 — End: 2023-09-21

## 2023-09-21 NOTE — ED Provider Notes (Addendum)
 [place] EMERGENCY DEPARTMENT AT [facility] Provider Note   CSN: [id number] Arrival date & time: 09/21/23  1754     History  Chief Complaint  Patient presents with   Shortness of Breath    Monique Henderson is a 73 y.o. female.  Pt is a 73 yo female with pmhx significant for ESRD on HD (missed dialysis today, last dialysis was Sat), hep c, fibroids, breast cancer (R), paroxysmal afib, chf, cva, aortic dissection, htn, and GERD.  Pt said she was supposed to go to dialysis today, but said her transportation did not pick her up.  Pt developed sob today around noon.  Pt is hypertensive and said she took her bp meds.  She was recently admitted for htn emergency (d/c 4/3).         Home Medications Prior to Admission medications   Medication Sig Start Date End Date Taking? Authorizing Provider  acetaminophen  (TYLENOL ) 325 MG tablet Take 650 mg by mouth every 6 (six) hours as needed for mild pain (pain score 1-3) or moderate pain (pain score 4-6).    [provider]  albuterol  (PROVENTIL ) (2.5 MG/3ML) 0.083% nebulizer solution Take 3 mLs (2.5 mg total) by nebulization every 6 (six) hours as needed for wheezing or shortness of breath. 08/23/23   Pokhrel, Amador Bad, MD  amLODipine  (NORVASC ) 10 MG tablet Take 1 tablet (10 mg total) by mouth daily. Patient taking differently: Take 10 mg by mouth in the morning. 03/06/22   Setzer, Hamp Levine, PA-C  apixaban  (ELIQUIS ) 2.5 MG TABS tablet Take 1 tablet (2.5 mg total) by mouth 2 (two) times daily. 03/06/22   Setzer, Sandra J, PA-C  carvedilol  (COREG ) 25 MG tablet Take 1 tablet (25 mg total) by mouth 2 (two) times daily with a meal. 05/01/22 06/16/24  Ghimire, Estil Heman, MD  Cholecalciferol  (VITAMIN D -3) 125 MCG (5000 UT) TABS Take 1 tablet by mouth in the morning.    [provider]  cinacalcet  (SENSIPAR ) 60 MG tablet Take 60 mg by mouth every dialysis (Tuesday, Thursday, and Saturday).    [provider]  cloNIDine   (CATAPRES ) 0.1 MG tablet Take 1 tablet (0.1 mg total) by mouth 2 (two) times daily. Do not take it before dialysis 09/02/23   Deforest Fast, MD  DOXERCALCIFEROL  PO Inject 9 mcg into the vein every dialysis (Tuesday, Thursday, and Saturday).    [provider]  hydrALAZINE  (APRESOLINE ) 100 MG tablet Take 1 tablet (100 mg total) by mouth every 8 (eight) hours. Patient taking differently: Take 100 mg by mouth 2 (two) times daily. 12/18/22   Lucendia Rusk, MD  isosorbide  mononitrate (IMDUR ) 30 MG 24 hr tablet TAKE 1 TABLET (30MG ) ON DIALYSIS DAYS AND 2 TABLETS (60 MG) ON NON DIALYSIS DAYS. 01/12/23   Lucendia Rusk, MD  LOKELMA  10 g PACK packet Take 10 g by mouth every Monday, Wednesday, and Friday. 07/30/23   [provider]  naloxone  (NARCAN ) nasal spray 4 mg/0.1 mL Place 1 spray into the nose as needed (opioid overdose). 02/19/22   [provider]  oxyCODONE -acetaminophen  (PERCOCET) 10-325 MG tablet Take 1 tablet by mouth in the morning and at bedtime. 03/12/22   [provider]  pantoprazole  (PROTONIX ) 40 MG tablet TAKE 1 TABLET (40 MG TOTAL) BY MOUTH 2 (TWO) TIMES DAILY. 05/13/23   Abraham Abo, MD  rosuvastatin  (CRESTOR ) 10 MG tablet TAKE 1 TABLET (10 MG TOTAL) BY MOUTH DAILY FOR CHOLESTEROL Patient taking differently: Take 10 mg by mouth in  the morning. 08/07/22   Abraham Abo, MD  senna-docusate (SENOKOT-S) 8.6-50 MG tablet Take 1 tablet by mouth 2 (two) times daily between meals as needed for mild constipation. 08/14/22   Gonfa, Taye T, MD  sevelamer  carbonate (RENVELA ) 2.4 g PACK Take 2.4 g by mouth 3 (three) times daily with meals. 03/06/22   Setzer, Sandra J, PA-C  traZODone  (DESYREL ) 50 MG tablet Take 25-50 mg by mouth at bedtime as needed for sleep. 03/19/23   [provider]      Allergies    Shrimp [shellfish allergy], Bactroban  [mupirocin ], Chlorhexidine  gluconate, Hydrocodone , Latex, Tylenol  [acetaminophen ], and Zestril  [lisinopril ]     Review of Systems   Review of Systems  Respiratory:  Positive for shortness of breath.   All other systems reviewed and are negative.   Physical Exam Updated Vital Signs BP (!) 174/105   Pulse 82   Temp 98.3 F (36.8 C) (Oral)   Resp (!) 28   Ht 5\' 3"  (1.6 m)   Wt 42.2 kg   SpO2 99%   BMI 16.48 kg/m  Physical Exam Vitals and nursing note reviewed.  Constitutional:      General: She is in acute distress.     Appearance: She is ill-appearing.  HENT:     Head: Normocephalic and atraumatic.     Mouth/Throat:     Mouth: Mucous membranes are moist.     Pharynx: Oropharynx is clear.  Eyes:     Extraocular Movements: Extraocular movements intact.     Pupils: Pupils are equal, round, and reactive to light.  Cardiovascular:     Rate and Rhythm: Normal rate and regular rhythm.  Pulmonary:     Effort: Tachypnea present.     Breath sounds: Rhonchi present.  Abdominal:     General: Bowel sounds are normal.     Palpations: Abdomen is soft.  Musculoskeletal:        General: Normal range of motion.     Cervical back: Normal range of motion and neck supple.  Skin:    General: Skin is warm.     Capillary Refill: Capillary refill takes less than 2 seconds.  Neurological:     General: No focal deficit present.     Mental Status: She is alert and oriented to person, place, and time.  Psychiatric:        Mood and Affect: Mood normal.        Behavior: Behavior normal.     ED Results / Procedures / Treatments   Labs (all labs ordered are listed, but only abnormal results are displayed) Labs Reviewed  BASIC METABOLIC PANEL WITH GFR - Abnormal; Notable for the following components:      Result Value   Potassium 6.8 (*)    CO2 18 (*)    Glucose, Bld 104 (*)    BUN 64 (*)    Creatinine, Ser 11.10 (*)    GFR, Estimated 3 (*)    Anion gap 21 (*)    All other components within normal limits  CBC WITH DIFFERENTIAL/PLATELET - Abnormal; Notable for the following components:    MCHC 29.5 (*)    RDW 17.5 (*)    Lymphs Abs 0.5 (*)    All other components within normal limits  BRAIN NATRIURETIC PEPTIDE - Abnormal; Notable for the following components:   B Natriuretic Peptide >4,500.0 (*)    All other components within normal limits  TROPONIN I (HIGH SENSITIVITY) - Abnormal; Notable for the following components:  Troponin I (High Sensitivity) 73 (*)    All other components within normal limits  HEPATITIS B SURFACE ANTIGEN  HEPATITIS B SURFACE ANTIBODY, QUANTITATIVE  TROPONIN I (HIGH SENSITIVITY)    EKG EKG Interpretation Date/Time:  Tuesday September 21 2023 18:08:42 EDT Ventricular Rate:  88 PR Interval:  173 QRS Duration:  144 QT Interval:  431 QTC Calculation: 522 R Axis:   52  Text Interpretation: Sinus rhythm LAE, consider biatrial enlargement Right bundle branch block No significant change since last tracing Confirmed by Sueellen Emery [phone]) on 09/21/2023 9:20:27 PM  Radiology DG Chest Port 1 View Result Date: 09/21/2023 CLINICAL DATA:  Shortness of breath. EXAM: PORTABLE CHEST 1 VIEW COMPARISON:  August 31, 2023 FINDINGS: Multiple sternal wires are noted. The cardiac silhouette is enlarged and unchanged in size. There is mild to moderate severity calcification of the aortic arch. Mild right basilar atelectasis and/or infiltrate is seen. This is very mildly increased in severity when compared to the prior study. No pleural effusion or pneumothorax is identified. The visualized skeletal structures are unremarkable. IMPRESSION: 1. Stable cardiomegaly with evidence of prior median sternotomy. 2. Mild right basilar atelectasis and/or infiltrate. Electronically Signed   By: Virgle Grime M.D.   On: 09/21/2023 19:25    Procedures Procedures    Medications Ordered in ED Medications  hydrALAZINE  (APRESOLINE ) injection 20 mg (has no administration in time range)  furosemide  (LASIX ) injection 40 mg (40 mg Intravenous Given 09/21/23 1835)  sodium zirconium  cyclosilicate (LOKELMA ) packet 10 g (10 g Oral Given 09/21/23 1947)  insulin  aspart (novoLOG ) injection 5 Units (5 Units Intravenous Given 09/21/23 1948)    And  dextrose  50 % solution 50 mL (50 mLs Intravenous Given 09/21/23 1949)  sodium bicarbonate  injection 50 mEq (50 mEq Intravenous Given 09/21/23 1953)  oxyCODONE  (Oxy IR/ROXICODONE ) immediate release tablet 10 mg (10 mg Oral Given 09/21/23 2057)    ED Course/ Medical Decision Making/ A&P                                 Medical Decision Making Amount and/or Complexity of Data Reviewed Labs: ordered. Radiology: ordered.  Risk OTC drugs. Prescription drug management. Decision regarding hospitalization.   This patient presents to the ED for concern of sob, this involves an extensive number of treatment options, and is a complaint that carries with it a high risk of complications and morbidity.  The differential diagnosis includes chf, copd, pna   Co morbidities that complicate the patient evaluation  ESRD on HD (missed dialysis today, last dialysis was Sat), hep c, fibroids, breast cancer (R), paroxysmal afib, chf, cva, aortic dissection, htn, and GERD   Additional history obtained:  Additional history obtained from epic chart review External records from outside source obtained and reviewed including EMS report   Lab Tests:  I Ordered, and personally interpreted labs.  The pertinent results include:  cbc nl, bmp with K elevated at 6.8, bun 64 and cr 11.1   Imaging Studies ordered:  I ordered imaging studies including cxr  I independently visualized and interpreted imaging which showed  Stable cardiomegaly with evidence of prior median sternotomy.  2. Mild right basilar atelectasis and/or infiltrate.   I agree with the radiologist interpretation   Cardiac Monitoring:  The patient was maintained on a cardiac monitor.  I personally viewed and interpreted the cardiac monitored which showed an underlying rhythm of:  nsr   Medicines ordered and  prescription drug management:  I ordered medication including lokelma , lasix , bicarb, insulin /glucose  for hyperkalemia  Reevaluation of the patient after these medicines showed that the patient improved I have reviewed the patients home medicines and have made adjustments as needed   Critical Interventions:  Hyperkalemia tx/dialysis   Consultations Obtained:  I requested consultation with the nephrologist (Dr. Austine Blunt),  and discussed lab and imaging findings as well as pertinent plan -he will take her to dialysis  Problem List / ED Course:  Hyperkalemia:  lokelma , bicarb, insulin /glucose, lasix , dialysis SOB:  likely from fluid overload.  I suspect sob will improve after dialysis and she can go home afterwards HTN:  pt given labetalol  Elevation in trop:  no cp.  Likely from ckd.   Reevaluation:  After the interventions noted above, I reevaluated the patient and found that they have :improved   Social Determinants of Health:  Lives at home   Dispostion:  After consideration of the diagnostic results and the patients response to treatment, I feel that the patent would benefit from dialysis and likely home after dialysis.      CRITICAL CARE Performed by: Sueellen Emery   Total critical care time: 30 minutes  Critical care time was exclusive of separately billable procedures and treating other patients.  Critical care was necessary to treat or prevent imminent or life-threatening deterioration.  Critical care was time spent personally by me on the following activities: development of treatment plan with patient and/or surrogate as well as nursing, discussions with consultants, evaluation of patient's response to treatment, examination of patient, obtaining history from patient or surrogate, ordering and performing treatments and interventions, ordering and review of laboratory studies, ordering and review of radiographic studies, pulse  oximetry and re-evaluation of patient's condition.     Final Clinical Impression(s) / ED Diagnoses Final diagnoses:  Hyperkalemia  ESRD on hemodialysis (HCC)  Acute on chronic congestive heart failure, unspecified heart failure type (HCC)  Hypertension, unspecified type    Rx / DC Orders ED Discharge Orders     None         Sueellen Emery, MD 09/21/23 2324    Sueellen Emery, MD 10/07/23 [phone]

## 2023-09-21 NOTE — ED Triage Notes (Signed)
 Pt BIB EMS from home for [facility] since noon today. Also reporting new cough x 2 days. Pt missed dialysis today d/t transportation, last dialysis Saturday. Family reports pt usu [facility] with missed dialysis

## 2023-09-22 ENCOUNTER — Emergency Department (HOSPITAL_COMMUNITY)

## 2023-09-22 DIAGNOSIS — I132 Hypertensive heart and chronic kidney disease with heart failure and with stage 5 chronic kidney disease, or end stage renal disease: Secondary | ICD-10-CM | POA: Diagnosis present

## 2023-09-22 DIAGNOSIS — K219 Gastro-esophageal reflux disease without esophagitis: Secondary | ICD-10-CM | POA: Diagnosis present

## 2023-09-22 DIAGNOSIS — I4892 Unspecified atrial flutter: Secondary | ICD-10-CM | POA: Diagnosis present

## 2023-09-22 DIAGNOSIS — I69351 Hemiplegia and hemiparesis following cerebral infarction affecting right dominant side: Secondary | ICD-10-CM | POA: Diagnosis not present

## 2023-09-22 DIAGNOSIS — Z886 Allergy status to analgesic agent status: Secondary | ICD-10-CM | POA: Diagnosis not present

## 2023-09-22 DIAGNOSIS — D631 Anemia in chronic kidney disease: Secondary | ICD-10-CM | POA: Diagnosis present

## 2023-09-22 DIAGNOSIS — N186 End stage renal disease: Secondary | ICD-10-CM | POA: Diagnosis present

## 2023-09-22 DIAGNOSIS — I10 Essential (primary) hypertension: Secondary | ICD-10-CM | POA: Diagnosis not present

## 2023-09-22 DIAGNOSIS — Z87891 Personal history of nicotine dependence: Secondary | ICD-10-CM | POA: Diagnosis not present

## 2023-09-22 DIAGNOSIS — I509 Heart failure, unspecified: Secondary | ICD-10-CM

## 2023-09-22 DIAGNOSIS — I5042 Chronic combined systolic (congestive) and diastolic (congestive) heart failure: Secondary | ICD-10-CM | POA: Diagnosis present

## 2023-09-22 DIAGNOSIS — Z7901 Long term (current) use of anticoagulants: Secondary | ICD-10-CM | POA: Diagnosis not present

## 2023-09-22 DIAGNOSIS — I480 Paroxysmal atrial fibrillation: Secondary | ICD-10-CM | POA: Diagnosis present

## 2023-09-22 DIAGNOSIS — Z992 Dependence on renal dialysis: Secondary | ICD-10-CM

## 2023-09-22 DIAGNOSIS — J9601 Acute respiratory failure with hypoxia: Secondary | ICD-10-CM | POA: Diagnosis present

## 2023-09-22 DIAGNOSIS — Z91158 Patient's noncompliance with renal dialysis for other reason: Secondary | ICD-10-CM | POA: Diagnosis not present

## 2023-09-22 DIAGNOSIS — R0602 Shortness of breath: Secondary | ICD-10-CM | POA: Diagnosis present

## 2023-09-22 DIAGNOSIS — J449 Chronic obstructive pulmonary disease, unspecified: Secondary | ICD-10-CM | POA: Diagnosis present

## 2023-09-22 DIAGNOSIS — E875 Hyperkalemia: Secondary | ICD-10-CM | POA: Diagnosis present

## 2023-09-22 DIAGNOSIS — E8770 Fluid overload, unspecified: Secondary | ICD-10-CM | POA: Diagnosis present

## 2023-09-22 DIAGNOSIS — R54 Age-related physical debility: Secondary | ICD-10-CM | POA: Diagnosis present

## 2023-09-22 DIAGNOSIS — Z981 Arthrodesis status: Secondary | ICD-10-CM | POA: Diagnosis not present

## 2023-09-22 DIAGNOSIS — I161 Hypertensive emergency: Secondary | ICD-10-CM | POA: Diagnosis present

## 2023-09-22 DIAGNOSIS — Z1152 Encounter for screening for COVID-19: Secondary | ICD-10-CM | POA: Diagnosis not present

## 2023-09-22 DIAGNOSIS — Z853 Personal history of malignant neoplasm of breast: Secondary | ICD-10-CM | POA: Diagnosis not present

## 2023-09-22 DIAGNOSIS — E785 Hyperlipidemia, unspecified: Secondary | ICD-10-CM | POA: Diagnosis present

## 2023-09-22 DIAGNOSIS — Z8249 Family history of ischemic heart disease and other diseases of the circulatory system: Secondary | ICD-10-CM | POA: Diagnosis not present

## 2023-09-22 LAB — RESP PANEL BY RT-PCR (RSV, FLU A&B, COVID)  RVPGX2
Influenza A by PCR: NEGATIVE
Influenza B by PCR: NEGATIVE
Resp Syncytial Virus by PCR: NEGATIVE
SARS Coronavirus 2 by RT PCR: NEGATIVE

## 2023-09-22 MED ORDER — LABETALOL HCL 5 MG/ML IV SOLN
10.0000 mg | INTRAVENOUS | Status: DC | PRN
  Filled 2023-09-22: qty 4

## 2023-09-22 MED ORDER — METOPROLOL TARTRATE 5 MG/5ML IV SOLN
5.0000 mg | INTRAVENOUS | Status: DC | PRN
  Administered 2023-09-23 – 2023-09-24 (×2): 5 mg via INTRAVENOUS
  Filled 2023-09-22 (×2): qty 5

## 2023-09-22 MED ORDER — CINACALCET HCL 30 MG PO TABS
60.0000 mg | ORAL_TABLET | ORAL | Status: DC
  Administered 2023-09-23: 60 mg via ORAL
  Filled 2023-09-22: qty 2

## 2023-09-22 MED ORDER — CARVEDILOL 12.5 MG PO TABS
25.0000 mg | ORAL_TABLET | Freq: Once | ORAL | Status: AC
  Administered 2023-09-22: 25 mg via ORAL
  Filled 2023-09-22: qty 2

## 2023-09-22 MED ORDER — NITROGLYCERIN IN D5W 200-5 MCG/ML-% IV SOLN
0.0000 ug/min | INTRAVENOUS | Status: DC
  Administered 2023-09-22: 5 ug/min via INTRAVENOUS
  Filled 2023-09-22 (×2): qty 250

## 2023-09-22 MED ORDER — SEVELAMER CARBONATE 2.4 G PO PACK
2.4000 g | PACK | Freq: Three times a day (TID) | ORAL | Status: DC
  Administered 2023-09-22 – 2023-09-24 (×4): 2.4 g via ORAL
  Filled 2023-09-22 (×8): qty 1

## 2023-09-22 MED ORDER — SENNOSIDES-DOCUSATE SODIUM 8.6-50 MG PO TABS: 1.0000 | ORAL_TABLET | Freq: Two times a day (BID) | ORAL | Status: DC | PRN

## 2023-09-22 MED ORDER — ROSUVASTATIN CALCIUM 5 MG PO TABS
10.0000 mg | ORAL_TABLET | Freq: Every morning | ORAL | Status: DC
  Administered 2023-09-22 – 2023-09-24 (×3): 10 mg via ORAL
  Filled 2023-09-22 (×3): qty 2

## 2023-09-22 MED ORDER — ACETAMINOPHEN 325 MG PO TABS
650.0000 mg | ORAL_TABLET | Freq: Four times a day (QID) | ORAL | Status: DC | PRN
  Administered 2023-09-22 – 2023-09-23 (×2): 650 mg via ORAL
  Filled 2023-09-22 (×3): qty 2

## 2023-09-22 MED ORDER — CARVEDILOL 25 MG PO TABS
25.0000 mg | ORAL_TABLET | Freq: Two times a day (BID) | ORAL | Status: DC
  Administered 2023-09-22 – 2023-09-24 (×4): 25 mg via ORAL
  Filled 2023-09-22: qty 2
  Filled 2023-09-22: qty 1
  Filled 2023-09-22 (×2): qty 2
  Filled 2023-09-22: qty 1

## 2023-09-22 MED ORDER — VITAMIN D 25 MCG (1000 UNIT) PO TABS
1000.0000 [IU] | ORAL_TABLET | Freq: Every morning | ORAL | Status: DC
  Administered 2023-09-22 – 2023-09-24 (×3): 1000 [IU] via ORAL
  Filled 2023-09-22 (×3): qty 1

## 2023-09-22 MED ORDER — PANTOPRAZOLE SODIUM 40 MG PO TBEC
40.0000 mg | DELAYED_RELEASE_TABLET | Freq: Two times a day (BID) | ORAL | Status: DC
  Administered 2023-09-22 – 2023-09-24 (×5): 40 mg via ORAL
  Filled 2023-09-22 (×4): qty 1
  Filled 2023-09-22: qty 2
  Filled 2023-09-22: qty 1

## 2023-09-22 MED ORDER — SODIUM CHLORIDE 0.9% FLUSH
3.0000 mL | Freq: Two times a day (BID) | INTRAVENOUS | Status: DC
  Administered 2023-09-22 – 2023-09-23 (×3): 3 mL via INTRAVENOUS

## 2023-09-22 MED ORDER — LABETALOL HCL 5 MG/ML IV SOLN: 20.0000 mg | INTRAVENOUS | Status: DC | PRN

## 2023-09-22 MED ORDER — TRAZODONE HCL 50 MG PO TABS: 25.0000 mg | ORAL_TABLET | Freq: Every evening | ORAL | Status: DC | PRN

## 2023-09-22 MED ORDER — CLONIDINE HCL 0.1 MG PO TABS
0.1000 mg | ORAL_TABLET | Freq: Two times a day (BID) | ORAL | Status: DC
  Administered 2023-09-22 – 2023-09-24 (×5): 0.1 mg via ORAL
  Filled 2023-09-22 (×7): qty 1

## 2023-09-22 MED ORDER — HYDRALAZINE HCL 50 MG PO TABS
100.0000 mg | ORAL_TABLET | Freq: Three times a day (TID) | ORAL | Status: DC
  Administered 2023-09-22 – 2023-09-24 (×6): 100 mg via ORAL
  Filled 2023-09-22 (×6): qty 2

## 2023-09-22 MED ORDER — PROCHLORPERAZINE EDISYLATE 10 MG/2ML IJ SOLN: 10.0000 mg | Freq: Four times a day (QID) | INTRAMUSCULAR | Status: DC | PRN

## 2023-09-22 MED ORDER — HYDRALAZINE HCL 20 MG/ML IJ SOLN
20.0000 mg | Freq: Once | INTRAMUSCULAR | Status: AC
  Administered 2023-09-22: 20 mg via INTRAVENOUS
  Filled 2023-09-22: qty 1

## 2023-09-22 MED ORDER — APIXABAN 2.5 MG PO TABS
2.5000 mg | ORAL_TABLET | Freq: Two times a day (BID) | ORAL | Status: DC
  Administered 2023-09-22 – 2023-09-24 (×4): 2.5 mg via ORAL
  Filled 2023-09-22 (×5): qty 1

## 2023-09-22 MED ORDER — AMLODIPINE BESYLATE 10 MG PO TABS
10.0000 mg | ORAL_TABLET | Freq: Every day | ORAL | Status: DC
  Administered 2023-09-22 – 2023-09-24 (×3): 10 mg via ORAL
  Filled 2023-09-22 (×2): qty 2
  Filled 2023-09-22: qty 1
  Filled 2023-09-22: qty 2

## 2023-09-22 MED ORDER — IPRATROPIUM-ALBUTEROL 0.5-2.5 (3) MG/3ML IN SOLN
3.0000 mL | Freq: Four times a day (QID) | RESPIRATORY_TRACT | Status: DC | PRN
  Administered 2023-09-22: 3 mL via RESPIRATORY_TRACT
  Filled 2023-09-22: qty 3

## 2023-09-22 NOTE — Progress Notes (Signed)
   09/22/23 0145  Vitals  Temp 98.2 F (36.8 C)  Temp Source Oral  BP (!) 186/114  MAP (mmHg) 137  BP Location Right Arm  BP Method Automatic  Patient Position (if appropriate) Lying  Pulse Rate (!) 52  Pulse Rate Source Monitor  ECG Heart Rate (!) 110  Resp (!) 31  Oxygen  Therapy  SpO2 100 %  O2 Device Nasal Cannula  O2 Flow Rate (L/min) 2 L/min  During Treatment Monitoring  Blood Flow Rate (mL/min) 0 mL/min  Arterial Pressure (mmHg) 0.8 mmHg  Venous Pressure (mmHg) -1.41 mmHg  TMP (mmHg) -6.26 mmHg  Ultrafiltration Rate (mL/min) 733 mL/min  Dialysate Flow Rate (mL/min) 299 ml/min  Dialysate Potassium Concentration 2  Dialysate Calcium  Concentration 2.5  Duration of HD Treatment -hour(s) 3 hour(s)  Cumulative Fluid Removed (mL) per Treatment  1400.13  HD Safety Checks Performed Yes  Intra-Hemodialysis Comments Tx completed  Post Treatment  Dialyzer Clearance Lightly streaked  Liters Processed 66.3  Fluid Removed (mL) 1400 mL  Tolerated HD Treatment Yes  Hemodialysis Catheter Right Subclavian Double lumen Permanent (Tunneled)  Placement Date/Time: 05/28/23 0942   Placed prior to admission: No  Serial / Lot #: [id number]  Expiration Date: 04/01/27  Time Out: Correct patient;Correct site;Correct procedure  Maximum sterile barrier precautions: Sterile gloves;Sterile gown;Mask;C...  Site Condition No complications  Blue Lumen Status Flushed;Saline locked;Dead end cap in place  Red Lumen Status Flushed;Blood return noted;Dead end cap in place  Purple Lumen Status Saline locked  Catheter fill volume (Arterial) 2 cc  Catheter fill volume (Venous) 2  Dressing Type Transparent  Dressing Status Clean, Dry, Intact;Antimicrobial disc/dressing in place  Interventions New dressing  Drainage Description None  Dressing Change Due 09/28/23  Post treatment catheter status Capped and Clamped

## 2023-09-22 NOTE — ED Notes (Signed)
 PT REF lab work

## 2023-09-22 NOTE — H&P (Signed)
 History and Physical    Patient: Monique Henderson:[id number] DOB: 1951-05-19 DOA: 09/21/2023 DOS: the patient was seen and examined on 09/22/2023 PCP: Charle Congo, MD  Patient coming from: Home  Chief Complaint:  Chief Complaint  Patient presents with   Shortness of Breath   HPI: Monique Henderson is a 73 y.o. 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  Patient was unable to attend dialysis on Saturday due to reported lack of transportation.  Initial BP at presentation was 203/112.  O2 sats were between 95 and 94% on room air.  Chest x-ray without any obvious edema or heart failure although apparently on exam patient did have diffuse crackles.  Potassium was 6.8, CO2 18, anion gap 21, BUN 64 and creatinine 11.1.  BNP elevated at greater than 4500 and troponin 73.  Nephrology was consulted.  Because she was hyperkalemic she was given an amp of D50 and 5 units of IV insulin  for cardioprotection and 1 dose of Lokelma  before dialysis.  Patient was eventually taken for emergent hemodialysis with documented 1400 cc volume removed.  Subsequently patient developed hypoxemia down to 83% on room air and she has had oxygen  applied at 4 L/min.  Hospitalist service has been asked to evaluate the patient for admission.  Upon my evaluation of the patient she was found to be in atrial flutter with one-to-one conduction with heart rates in the 140s.  This lasted about 2 to 3 minutes and then she subsequently spontaneously converted to sinus rhythm with ventricular rates in the 80s.  Her nurse confirms that she has been experiencing bursts of RVR lasting about 2 to 3 minutes.  She had not been given her a.m. dose of Coreg  25 mg so I ordered 1 dose and have ordered for IV  Lopressor  as needed heart rate sustaining greater than 120 bpm.  Blood pressure remains significantly elevated with systolic readings between 180s to 200 and diastolic readings in the 100s.  I have ordered IV nitroglycerin .  Patient is somewhat of a poor historian and is unclear whether she is adherent to her dialysis regularly and to her antihypertensive medications at home.   Review of Systems: As mentioned in the history of present illness. All other systems reviewed and are negative. Past Medical History:  Diagnosis Date   Acute cardioembolic stroke (HCC) 02/26/2022   AF (paroxysmal atrial fibrillation) (HCC) 05/29/2019   on Coumadin    Aortic atherosclerosis (HCC) 07/05/2019   Aortic dissection (HCC) 04/04/2019   s/p repair   Bone spur 2008   Right calcaneal foot spur   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 Lillette Reid; S/P re-excision of superior and inferior margins 10/27/2012.    Dissection of aorta (HCC) 04/03/2019   ESRD on hemodialysis (HCC) 05/29/2019   Essential hypertension 09/16/2006   GERD 09/16/2006   Hepatitis C  treated and RNA confirmed not detectable 01/2017   Insomnia 03/14/2015   Malnutrition of moderate degree 05/19/2019   Non compliance with medical treatment 12/04/2017   Normocytic anemia    With thrombocytosis   Osteoarthritis    Right ureteral stone 2002   S/P lumbar spinal fusion 01/18/2014   S/P lumbar decompressive laminectomy, fusion, and plating for lumbar spinal  stenosis on 05/27/2009 by Dr. Isadora Mar.  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 Isadora Mar.     Stroke (cerebrum) (HCC)    Stroke (HCC) 02/23/2022   SVT (supraventricular tachycardia) (HCC) 08/28/2019   Tobacco use disorder 04/19/2009   Uterine fibroid    Wears dentures    top   Past Surgical History:  Procedure Laterality Date   ANTERIOR LAT LUMBAR FUSION N/A 01/18/2014   Procedure: ANTERIOR LATERAL LUMBAR FUSION LUMBAR TWO-THREE;  Surgeon: Isadora Mar, MD;  Location: [facility];  Service: Neurosurgery;  Laterality: N/A;   ANTERIOR LUMBAR FUSION  01/18/2014   AV FISTULA PLACEMENT Left 04/20/2019   Procedure: ARTERIOVENOUS (AV) FISTULA CREATION;  Surgeon: Adine Hoof, MD;  Location: [facility] OR;  Service: Vascular;  Laterality: Left;   BACK SURGERY     BREAST LUMPECTOMY Left 01/2003   BREAST LUMPECTOMY Right 2014   BREAST LUMPECTOMY WITH NEEDLE LOCALIZATION AND AXILLARY SENTINEL LYMPH NODE BX Right 10/13/2012   Procedure: BREAST LUMPECTOMY WITH NEEDLE LOCALIZATION;  Surgeon: Mayme Spearman, MD;  Location: [facility];  Service: General;  Laterality: Right;  Right breast wire localized lumpectomy   DIALYSIS/PERMA CATHETER INSERTION N/A 05/28/2023   Procedure: DIALYSIS/PERMA CATHETER INSERTION;  Surgeon: Patrick Boor, MD;  Location: [facility];  Service: Cardiovascular;  Laterality: N/A;   INSERTION OF DIALYSIS CATHETER Right 04/20/2019   Procedure: INSERTION OF DIALYSIS CATHETER, right internal jugular;  Surgeon: Adine Hoof, MD;  Location: [facility] OR;  Service: Vascular;  Laterality: Right;   INSERTION OF DIALYSIS CATHETER Right 09/24/2020   Procedure: INSERTION OF TUNNELED DIALYSIS CATHETER;  Surgeon: Mayo Speck, MD;  Location: [facility] OR;  Service:  Vascular;  Laterality: Right;   IR FLUORO GUIDE CV LINE RIGHT  09/22/2020   IR THORACENTESIS ASP PLEURAL SPACE W/IMG GUIDE  05/19/2019   IR US  GUIDE VASC ACCESS LEFT  09/22/2020   IR US  GUIDE VASC ACCESS RIGHT  09/22/2020   IR VENOCAVAGRAM SVC  09/22/2020   LAMINECTOMY  05/27/2009   Lumbar decompressive laminectomy, fusion and plating for lumbar spinal stensosis   LIGATION OF ARTERIOVENOUS  FISTULA Left 09/24/2020   Procedure: LIGATION OF LEFT ARM ARTERIOVENOUS  FISTULA;  Surgeon: Mayo Speck, MD;  Location: [facility] OR;  Service: Vascular;  Laterality: Left;   LUMBAR LAMINECTOMY/DECOMPRESSION MICRODISCECTOMY Left 03/23/2013   Procedure: LUMBAR LAMINECTOMY/DECOMPRESSION MICRODISCECTOMY 1 LEVEL;  Surgeon: Isadora Mar, MD;  Location: [facility];  Service: Neurosurgery;  Laterality: Left;  LUMBAR LAMINECTOMY/DECOMPRESSION MICRODISCECTOMY 1 LEVEL   MASTECTOMY, PARTIAL Left 02/26/2003   ; S/P re-excision of cranial and lateral margins 04/19/2003.    RE-EXCISION OF BREAST CANCER,SUPERIOR MARGINS Right 10/27/2012   Procedure: RE-EXCISION OF BREAST CANCER,SUPERIOR and inferior MARGINS;  Surgeon: Mayme Spearman, MD;  Location: [facility] OR;  Service: General;  Laterality: Right;   RE-EXCISION OF BREAST LUMPECTOMY Left 04/2003   TEE WITHOUT CARDIOVERSION N/A  04/04/2019   Procedure: Transesophageal Echocardiogram (Tee);  Surgeon: Rudine Cos, MD;  Location: [facility] OR;  Service: Open Heart Surgery;  Laterality: N/A;   THORACIC AORTIC ANEURYSM REPAIR N/A 04/04/2019   Procedure: THORACIC ASCENDING ANEURYSM REPAIR (AAA)  USING 28 MM X 30 CM HEMASHIELD PLATINUM VASCULAR GRAFT;  Surgeon: Rudine Cos, MD;  Location: [facility] OR;  Service: Open Heart Surgery;  Laterality: N/A;   Social History:  reports that she quit smoking about 3 years ago. Her smoking use included cigarettes. She started smoking about 47 years ago. She has a 11 pack-year smoking history. She has never used smokeless tobacco. She reports that she does not  currently use alcohol  after a past usage of about 2.0 standard drinks of alcohol  per week. She reports that she does not currently use drugs after having used the following drugs: Cocaine .  Allergies  Allergen Reactions   Shrimp [Shellfish Allergy] Shortness Of Breath   Bactroban  Corin.Cool ] Other (See Comments)    "Sores in nose"   Chlorhexidine  Gluconate Itching   Hydrocodone  Itching, Nausea And Vomiting and Nausea Only   Latex Itching    Latex gloves cause itchiness and a rash   Tylenol  [Acetaminophen ] Itching    Takes Percocet at home 05/06/23   Zestril  [Lisinopril ] Cough    Family History  Problem Relation Age of Onset   Colon cancer Mother 56   Hypertension Mother    Diabetes Sister 77   Hypertension Sister    Diabetes Brother    Hypertension Brother    Diabetes Brother    Hypertension Brother    Kidney disease Son        s/p renal transplant   Hypertension Son    Diabetes Son    Multiple sclerosis Son    Bone cancer Sister 81   Breast cancer Neg Hx    Cervical cancer Neg Hx     Prior to Admission medications   Medication Sig Start Date End Date Taking? Authorizing Provider  acetaminophen  (TYLENOL ) 325 MG tablet Take 650 mg by mouth every 6 (six) hours as needed for mild pain (pain score 1-3) or moderate pain (pain score 4-6).    [provider]  albuterol  (PROVENTIL ) (2.5 MG/3ML) 0.083% nebulizer solution Take 3 mLs (2.5 mg total) by nebulization every 6 (six) hours as needed for wheezing or shortness of breath. 08/23/23   Pokhrel, Laxman, MD  amLODipine  (NORVASC ) 10 MG tablet Take 1 tablet (10 mg total) by mouth daily. Patient taking differently: Take 10 mg by mouth in the morning. 03/06/22   Setzer, Sandra J, PA-C  apixaban  (ELIQUIS ) 2.5 MG TABS tablet Take 1 tablet (2.5 mg total) by mouth 2 (two) times daily. 03/06/22   Setzer, Hamp Levine, PA-C  carvedilol  (COREG ) 25 MG tablet Take 1 tablet (25 mg total) by mouth 2 (two) times daily with a meal. 05/01/22 06/16/24   Ghimire, Estil Heman, MD  Cholecalciferol  (VITAMIN D -3) 125 MCG (5000 UT) TABS Take 1 tablet by mouth in the morning.    [provider]  cinacalcet  (SENSIPAR ) 60 MG tablet Take 60 mg by mouth every dialysis (Tuesday, Thursday, and Saturday).    [provider]  cloNIDine  (CATAPRES ) 0.1 MG tablet Take 1 tablet (0.1 mg total) by mouth 2 (two) times daily. Do not take it before dialysis 09/02/23   Deforest Fast, MD  hydrALAZINE  (APRESOLINE ) 100 MG tablet Take 1 tablet (100 mg total) by mouth every 8 (eight) hours. Patient taking differently: Take 100 mg by  mouth 2 (two) times daily. 12/18/22   Lucendia Rusk, MD  isosorbide  mononitrate (IMDUR ) 30 MG 24 hr tablet TAKE 1 TABLET (30MG ) ON DIALYSIS DAYS AND 2 TABLETS (60 MG) ON NON DIALYSIS DAYS. 01/12/23   Lucendia Rusk, MD  LOKELMA  10 g PACK packet Take 10 g by mouth every Monday, Wednesday, and Friday. 07/30/23   [provider]  naloxone  (NARCAN ) nasal spray 4 mg/0.1 mL Place 1 spray into the nose as needed (opioid overdose). 02/19/22   [provider]  oxyCODONE -acetaminophen  (PERCOCET) 10-325 MG tablet Take 1 tablet by mouth in the morning and at bedtime. 03/12/22   [provider]  pantoprazole  (PROTONIX ) 40 MG tablet TAKE 1 TABLET (40 MG TOTAL) BY MOUTH 2 (TWO) TIMES DAILY. 05/13/23   Abraham Abo, MD  rosuvastatin  (CRESTOR ) 10 MG tablet TAKE 1 TABLET (10 MG TOTAL) BY MOUTH DAILY FOR CHOLESTEROL Patient taking differently: Take 10 mg by mouth in the morning. 08/07/22   Abraham Abo, MD  senna-docusate (SENOKOT-S) 8.6-50 MG tablet Take 1 tablet by mouth 2 (two) times daily between meals as needed for mild constipation. 08/14/22   Gonfa, Taye T, MD  sevelamer  carbonate (RENVELA ) 2.4 g PACK Take 2.4 g by mouth 3 (three) times daily with meals. 03/06/22   Setzer, Sandra J, PA-C  traZODone  (DESYREL ) 50 MG tablet Take 25-50 mg by mouth at bedtime as needed for sleep. 03/19/23   [provider]     Physical Exam: Vitals:   09/22/23 0900 09/22/23 0910 09/22/23 0922 09/22/23 0945  BP: (!) 185/110 (!) 139/107 (!) 159/103 (!) 187/110  Pulse:  (!) 137 74 78  Resp: (!) 22  (!) 24 14  Temp:      TempSrc:      SpO2: 98% 99% 100% 100%  Weight:      Height:       Constitutional: NAD, calm, comfortable Respiratory: Diffuse bilateral crackles on posterior exam normal respiratory effort. No accessory muscle use.  4 L nasal cannula Cardiovascular: Regular rate and rhythm, no murmurs / rubs / gallops.  No lower extremity peripheral extremity edema. 1+ pedal pulses.  Abdomen: no tenderness, no masses palpated. No hepatosplenomegaly. Bowel sounds positive.  Musculoskeletal: no clubbing / cyanosis. No joint deformity upper and lower extremities. Good ROM, no contractures. Normal muscle tone.  Skin: no rashes, lesions, ulcers. No induration Neurologic: CN 2-12 grossly intact. Sensation intact,  Strength 5/5 x all 4 extremities.  Psychiatric: Alert and oriented x 3. Normal mood.     Data Reviewed:  Labs from last night:  Sodium 137, potassium 6.8,, CO2 18 with an anion gap of 21, BUN 64 and creatinine 11.1  BNP greater than 4500, troponin 73  WBCs 7700 slightly elevated neutrophils 80, hemoglobin 12.9, platelets 172,000  Viral PCR negative for COVID, RSV and flu  Assessment and Plan: Acute hypoxemic respiratory failure.  Presumed secondary to missed hemodialysis On exam continues to have crackles as well as requirement for 4 L of oxygen -BiPAP has been ordered in the event patient worsens May needed additional dialysis session today-defer to nephrology team No evidence of infectious etiology She does have bursts of atrial flutter with RVR.  Likely due to volume overload but to rule out PE if clinically indicated-please note that patient has been prescribed Eliquis  for her A-fib but if she has been noncompliant with this medication that could put her at risk for PE Was given a dose of  IV Lasix  by EDP  Hypertensive emergency Given  IV Lasix  as well as IV hydralazine  by EDP without significant improvement in blood pressure readings Started on IV NTG and plan is to titrate to keep SBP </= 160  Acute hyperkalemia Was given 1 amp of D50 and 5 units of IV insulin  for cardioprotection Also given Lokelma  x 1 dose prior to dialysis Did undergo dialysis so anticipate potassium should have decreased but patient refusing labs to confirm Continue telemetry monitoring  CKD on chronic hemodialysis Patient refused a.m. labs so uncertain regarding potassium level-potassium was 6.8 prior to dialysis and anticipate potassium should have normalized after dialysis last night If patient needs an additional dialysis session today labs can be obtained at the end of her dialysis treatment Need to clarify with nephrology if patient frequently misses dialysis treatments or signs off early  History of HFrEF EF 45% per echo January 2025 Unfortunately with underlying chronic kidney disease BNP will be inaccurate so she will need to be followed clinically Troponin also inaccurate due to underlying chronic kidney disease Daily weights with strict intake and output Dialysis primary modality regarding volume removal  Atrial fibrillation on chronic anticoagulation Continue Eliquis  Focus on rate control noting frequent bursts of RVR Was given a now dose of her home carvedilol  which will be continued twice daily IV Lopressor  as needed heart rate >/= 120 sustained Patient has no awareness of her tachycardia when it occurs and she could have been experiencing undetected tachycardia at home which could have precipitated or contributed to her pulmonary symptoms  COPD Currently compensated without wheezing As needed DuoNebs but if tachycardia persists may need to change to Xopenex   History of polysubstance abuse If patient makes urine we will attempt to obtain urine drug screen to make sure she has not  been using cocaine  especially in context of recent tachycardia    Advance Care Planning:   Code Status: Full Code   VTE prophylaxis: Eliquis   Consults: Nephrology  Family Communication: Patient only  Severity of Illness: The appropriate patient status for this patient is INPATIENT. Inpatient status is judged to be reasonable and necessary in order to provide the required intensity of service to ensure the patient's safety. The patient's presenting symptoms, physical exam findings, and initial radiographic and laboratory data in the context of their chronic comorbidities is felt to place them at high risk for further clinical deterioration. Furthermore, it is not anticipated that the patient will be medically stable for discharge from the hospital within 2 midnights of admission.   * I certify that at the point of admission it is my clinical judgment that the patient will require inpatient hospital care spanning beyond 2 midnights from the point of admission due to high intensity of service, high risk for further deterioration and high frequency of surveillance required.*  Author: Kathye Parkin, NP 09/22/2023 10:10 AM  For on call review [email or web address].

## 2023-09-22 NOTE — ED Notes (Signed)
 Pt returned from dialysis, goal was 2 lt however only took off 1.4lt b/c her heart rate increased.  Her O2 was 83% on room air, placed her on 4L which increased her O2 to 95%.  Provider aware and bedside.

## 2023-09-22 NOTE — ED Provider Notes (Signed)
 Patient returns from dialysis with new hypoxia.  She is 83% on room air.  She does not wear oxygen  at home.  Blood pressure remains significantly elevated.  She did have a recent admission for hypertensive urgency.  I discussed case with Dr. Brice Campi, who is appreciated for admitting the patient to the hospital for persistent hypoxia and now requiring 4 L nasal cannula.  Patient given a dose of 20 mg IV hydralazine  for blood pressure control.  Will likely need continued blood pressure management.   Sherel Dikes, PA-C 09/22/23 [id number]    Earma Gloss, MD 09/22/23 [phone]

## 2023-09-22 NOTE — Consult Note (Signed)
 [facility] Nephrology Consult Note: Reason for Consult: To manage dialysis and dialysis related needs Referring Physician: Dr. Jonelle Neri, Susana Enter  HPI:  Monique Henderson is an 73 y.o. female with past medical history significant for hypertension, COPD, stroke, A-fib on Eliquis , ESRD on HD noncompliant with outpatient treatment presented with shortness of breath, hypertension and hyperkalemia seen as a consultation for the management of ESRD.  It seems like her last outpatient dialysis was on 4/19.  On presentation, she was hypertensive.  The labs showed potassium of 6.8, BUN 64 and creatinine 11.  Also hypoxic.  The patient completed dialysis this morning and already feeling much better.  Remote around 1.4 L of fluid.  She is now admitted for further evaluation of SOB/hypoxia.  Denies chest pain, nausea or vomiting.  No fever or chills.  OP Dialysis Orders:  AF TTS  3.5h  B400  46kg   2k bath RIJ TDC  Heparin  none  Past Medical History:  Diagnosis Date   Acute cardioembolic stroke (HCC) 02/26/2022   AF (paroxysmal atrial fibrillation) (HCC) 05/29/2019   on Coumadin    Aortic atherosclerosis (HCC) 07/05/2019   Aortic dissection (HCC) 04/04/2019   s/p repair   Bone spur 2008   Right calcaneal foot spur   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 Lillette Reid; S/P re-excision of superior and inferior margins 10/27/2012.    Dissection of aorta (HCC) 04/03/2019   ESRD on hemodialysis (HCC) 05/29/2019    Essential hypertension 09/16/2006   GERD 09/16/2006   Hepatitis C    treated and RNA confirmed not detectable 01/2017   Insomnia 03/14/2015   Malnutrition of moderate degree 05/19/2019   Non compliance with medical treatment 12/04/2017   Normocytic anemia    With thrombocytosis   Osteoarthritis    Right ureteral stone 2002   S/P lumbar spinal fusion 01/18/2014   S/P lumbar decompressive laminectomy, fusion, and plating for lumbar spinal stenosis on 05/27/2009 by Dr. Isadora Mar.  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 Isadora Mar.     Stroke (cerebrum) (HCC)    Stroke (HCC) 02/23/2022   SVT (supraventricular tachycardia) (HCC) 08/28/2019   Tobacco use disorder 04/19/2009   Uterine fibroid    Wears dentures    top    Past Surgical History:  Procedure Laterality Date   ANTERIOR LAT LUMBAR FUSION N/A 01/18/2014   Procedure: ANTERIOR LATERAL LUMBAR FUSION LUMBAR TWO-THREE;  Surgeon: Isadora Mar, MD;  Location: [facility];  Service: Neurosurgery;  Laterality: N/A;   ANTERIOR LUMBAR FUSION  01/18/2014   AV FISTULA PLACEMENT Left 04/20/2019   Procedure: ARTERIOVENOUS (AV) FISTULA CREATION;  Surgeon: Adine Hoof, MD;  Location: [facility] OR;  Service: Vascular;  Laterality: Left;   BACK SURGERY     BREAST LUMPECTOMY Left 01/2003   BREAST LUMPECTOMY Right 2014   BREAST LUMPECTOMY WITH  NEEDLE LOCALIZATION AND AXILLARY SENTINEL LYMPH NODE BX Right 10/13/2012   Procedure: BREAST LUMPECTOMY WITH NEEDLE LOCALIZATION;  Surgeon: Mayme Spearman, MD;  Location: [facility];  Service: General;  Laterality: Right;  Right breast wire localized lumpectomy   DIALYSIS/PERMA CATHETER INSERTION N/A 05/28/2023   Procedure: DIALYSIS/PERMA CATHETER INSERTION;  Surgeon: Patrick Boor, MD;   Location: [facility] INVASIVE CV LAB;  Service: Cardiovascular;  Laterality: N/A;   INSERTION OF DIALYSIS CATHETER Right 04/20/2019   Procedure: INSERTION OF DIALYSIS CATHETER, right internal jugular;  Surgeon: Adine Hoof, MD;  Location: [facility] OR;  Service: Vascular;  Laterality: Right;   INSERTION OF DIALYSIS CATHETER Right 09/24/2020   Procedure: INSERTION OF TUNNELED DIALYSIS CATHETER;  Surgeon: Mayo Speck, MD;  Location: [facility] OR;  Service: Vascular;  Laterality: Right;   IR FLUORO GUIDE CV LINE RIGHT  09/22/2020   IR THORACENTESIS ASP PLEURAL SPACE W/IMG GUIDE  05/19/2019   IR US  GUIDE VASC ACCESS LEFT  09/22/2020   IR US  GUIDE VASC ACCESS RIGHT  09/22/2020   IR VENOCAVAGRAM SVC  09/22/2020   LAMINECTOMY  05/27/2009   Lumbar decompressive laminectomy, fusion and plating for lumbar spinal stensosis   LIGATION OF ARTERIOVENOUS  FISTULA Left 09/24/2020   Procedure: LIGATION OF LEFT ARM ARTERIOVENOUS  FISTULA;  Surgeon: Mayo Speck, MD;  Location: [facility] OR;  Service: Vascular;  Laterality: Left;   LUMBAR LAMINECTOMY/DECOMPRESSION MICRODISCECTOMY Left 03/23/2013   Procedure: LUMBAR LAMINECTOMY/DECOMPRESSION MICRODISCECTOMY 1 LEVEL;  Surgeon: Isadora Mar, MD;  Location: [facility];  Service: Neurosurgery;  Laterality: Left;  LUMBAR LAMINECTOMY/DECOMPRESSION MICRODISCECTOMY 1 LEVEL   MASTECTOMY, PARTIAL Left 02/26/2003   ; S/P re-excision of cranial and lateral margins 04/19/2003.    RE-EXCISION OF BREAST CANCER,SUPERIOR MARGINS Right 10/27/2012   Procedure: RE-EXCISION OF BREAST CANCER,SUPERIOR and inferior MARGINS;  Surgeon: Mayme Spearman, MD;  Location: [facility] OR;  Service: General;  Laterality: Right;   RE-EXCISION OF BREAST LUMPECTOMY Left 04/2003   TEE WITHOUT CARDIOVERSION N/A 04/04/2019   Procedure: Transesophageal Echocardiogram (Tee);  Surgeon: Rudine Cos, MD;  Location: [facility] OR;  Service: Open Heart Surgery;  Laterality: N/A;   THORACIC AORTIC ANEURYSM REPAIR N/A 04/04/2019    Procedure: THORACIC ASCENDING ANEURYSM REPAIR (AAA)  USING 28 MM X 30 CM HEMASHIELD PLATINUM VASCULAR GRAFT;  Surgeon: Rudine Cos, MD;  Location: [facility] OR;  Service: Open Heart Surgery;  Laterality: N/A;    Family History  Problem Relation Age of Onset   Colon cancer Mother 49   Hypertension Mother    Diabetes Sister 19   Hypertension Sister    Diabetes Brother    Hypertension Brother    Diabetes Brother    Hypertension Brother    Kidney disease Son        s/p renal transplant   Hypertension Son    Diabetes Son    Multiple sclerosis Son    Bone cancer Sister 86   Breast cancer Neg Hx    Cervical cancer Neg Hx     Social History:  reports that she quit smoking about 3 years ago. Her smoking use included cigarettes. She started smoking about 47 years ago. She has a 11 pack-year smoking history. She has never used smokeless tobacco. She reports that she does not currently use alcohol  after a past usage of about 2.0 standard drinks of alcohol  per week. She reports that she does not currently use drugs after having used the following drugs: Cocaine .  Allergies:  Allergies  Allergen Reactions   Shrimp [Shellfish Allergy] Shortness Of Breath   Bactroban  [Mupirocin ] Other (See Comments)    "Sores in nose"   Hydrocodone  Itching, Nausea And Vomiting and Nausea Only   Chlorhexidine  Gluconate Itching   Latex Itching    Latex gloves cause itchiness and a rash   Tylenol  [Acetaminophen ] Itching    Takes Percocet at home 05/06/23   Zestril  [Lisinopril ] Cough    Medications: I have reviewed the patient's current medications.   Results for orders placed or performed during the hospital encounter of 09/21/23 (from the past 48 hours)  Basic metabolic panel     Status: Abnormal   Collection Time: 09/21/23  6:21 PM  Result Value Ref Range   Sodium 137 135 - 145 mmol/L   Potassium 6.8 (HH) 3.5 - 5.1 mmol/L    Comment: CRITICAL RESULT CALLED TO, READ BACK BY AND VERIFIED WITH S.SIMS,RN  @1922  09/21/2023 VANG.J   Chloride 98 98 - 111 mmol/L   CO2 18 (L) 22 - 32 mmol/L   Glucose, Bld 104 (H) 70 - 99 mg/dL    Comment: Glucose reference range applies only to samples taken after fasting for at least 8 hours.   BUN 64 (H) 8 - 23 mg/dL   Creatinine, Ser 16.10 (H) 0.44 - 1.00 mg/dL   Calcium  9.3 8.9 - 10.3 mg/dL   GFR, Estimated 3 (L) >60 mL/min    Comment: (NOTE) Calculated using the CKD-EPI Creatinine Equation (2021)    Anion gap 21 (H) 5 - 15    Comment: ELECTROLYTES REPEATED TO VERIFY Performed at [facility] Lab, [address] [place], [place] [address]   CBC with Differential     Status: Abnormal   Collection Time: 09/21/23  6:21 PM  Result Value Ref Range   WBC 7.7 4.0 - 10.5 K/uL   RBC 4.40 3.87 - 5.11 MIL/uL   Hemoglobin 12.9 12.0 - 15.0 g/dL   HCT 40.9 81.1 - 91.4 %   MCV 99.3 80.0 - 100.0 fL   MCH 29.3 26.0 - 34.0 pg   MCHC 29.5 (L) 30.0 - 36.0 g/dL   RDW 78.2 (H) 95.6 - 21.3 %   Platelets 172 150 - 400 K/uL   nRBC 0.0 0.0 - 0.2 %   Neutrophils Relative % 80 %   Neutro Abs 6.2 1.7 - 7.7 K/uL   Lymphocytes Relative 7 %   Lymphs Abs 0.5 (L) 0.7 - 4.0 K/uL   Monocytes Relative 9 %   Monocytes Absolute 0.7 0.1 - 1.0 K/uL   Eosinophils Relative 3 %   Eosinophils Absolute 0.2 0.0 - 0.5 K/uL   Basophils Relative 1 %   Basophils Absolute 0.1 0.0 - 0.1 K/uL   Immature Granulocytes 0 %   Abs Immature Granulocytes 0.02 0.00 - 0.07 K/uL    Comment: Performed at [facility] Lab, [address] [place], [place] [address]  Brain natriuretic peptide     Status: Abnormal   Collection Time: 09/21/23  8:09 PM  Result Value Ref Range   B Natriuretic Peptide >4,500.0 (H) 0.0 - 100.0 pg/mL    Comment: Performed at [facility], [address], [place], [place] [address]  Troponin I (High Sensitivity)     Status: Abnormal   Collection Time: 09/21/23  8:09 PM  Result Value Ref Range   Troponin I (High Sensitivity) 73 (H) <18 ng/L    Comment:  (NOTE) Elevated high sensitivity troponin I (hsTnI)  values and significant  changes across serial measurements may suggest ACS but many other  chronic and acute conditions are known to elevate hsTnI results.  Refer to the "Links" section for chest pain algorithms and additional  guidance. Performed at [facility], [address] [place], [place] [address]   Hepatitis B surface antigen     Status: None   Collection Time: 09/21/23 10:00 PM  Result Value Ref Range   Hepatitis B Surface Ag NON REACTIVE NON REACTIVE    Comment: Performed at [facility] Lab, [address], [place], [place] [address]  Resp panel by RT-PCR (RSV, Flu A&B, Covid) Anterior Nasal Swab     Status: None   Collection Time: 09/22/23  3:10 AM   Specimen: Anterior Nasal Swab  Result Value Ref Range   SARS Coronavirus 2 by RT PCR NEGATIVE NEGATIVE   Influenza A by PCR NEGATIVE NEGATIVE   Influenza B by PCR NEGATIVE NEGATIVE    Comment: (NOTE) The Xpert Xpress SARS-CoV-2/FLU/RSV plus assay is intended as an aid in the diagnosis of influenza from Nasopharyngeal swab specimens and should not be used as a sole basis for treatment. Nasal washings and aspirates are unacceptable for Xpert Xpress SARS-CoV-2/FLU/RSV testing.  Fact Sheet for Patients: BloggerCourse.com  Fact Sheet for Healthcare Providers: SeriousBroker.it  This test is not yet approved or cleared by the [place]  FDA and has been authorized for detection and/or diagnosis of SARS-CoV-2 by FDA under an Emergency Use Authorization (EUA). This EUA will remain in effect (meaning this test can be used) for the duration of the COVID-19 declaration under Section 564(b)(1) of the Act, 21 U.S.C. section 360bbb-3(b)(1), unless the authorization is terminated or revoked.     Resp Syncytial Virus by PCR NEGATIVE NEGATIVE    Comment: (NOTE) Fact Sheet for  Patients: BloggerCourse.com  Fact Sheet for Healthcare Providers: SeriousBroker.it  This test is not yet approved or cleared by the [place]  FDA and has been authorized for detection and/or diagnosis of SARS-CoV-2 by FDA under an Emergency Use Authorization (EUA). This EUA will remain in effect (meaning this test can be used) for the duration of the COVID-19 declaration under Section 564(b)(1) of the Act, 21 U.S.C. section 360bbb-3(b)(1), unless the authorization is terminated or revoked.  Performed at [facility], [address], [place], [place] [address]    *Note: Due to a large number of results and/or encounters for the requested time period, some results have not been displayed. A complete set of results can be found in Results Review.    DG Chest 2 View Result Date: 09/22/2023 CLINICAL DATA:  Shortness of breath and increased heart rate. EXAM: CHEST - 2 VIEW COMPARISON:  September 21, 2023 FINDINGS: There is stable right-sided venous catheter positioning. Multiple sternal wires are noted. The cardiac silhouette is enlarged and unchanged in size. Calcification of the aortic arch is present. Mild, stable right basilar atelectasis and/or infiltrate is seen. No pleural effusion or pneumothorax is identified. The visualized skeletal structures are unremarkable. IMPRESSION: 1. Stable cardiomegaly and evidence of prior median sternotomy. 2. Mild, stable right basilar atelectasis and/or infiltrate. Follow-up to resolution is recommended. Electronically Signed   By: Virgle Grime M.D.   On: 09/22/2023 03:55   DG Chest Port 1 View Result Date: 09/21/2023 CLINICAL DATA:  Shortness of breath. EXAM: PORTABLE CHEST 1 VIEW COMPARISON:  August 31, 2023 FINDINGS: Multiple sternal wires are noted. The cardiac silhouette is enlarged and unchanged in size. There  is mild to moderate severity calcification of the aortic arch. Mild right basilar  atelectasis and/or infiltrate is seen. This is very mildly increased in severity when compared to the prior study. No pleural effusion or pneumothorax is identified. The visualized skeletal structures are unremarkable. IMPRESSION: 1. Stable cardiomegaly with evidence of prior median sternotomy. 2. Mild right basilar atelectasis and/or infiltrate. Electronically Signed   By: Virgle Grime M.D.   On: 09/21/2023 19:25    ROS: As per H&P, rest of the systems reviewed and negative. Blood pressure (!) 169/116, pulse 72, temperature 98.2 F (36.8 C), temperature source Axillary, resp. rate (!) 22, height 5\' 3"  (1.6 m), weight 42.2 kg, SpO2 100%. Gen: NAD, comfortable Respiratory: Clear bilateral, no wheezing or crackle Cardiovascular: Regular rate rhythm S1-S2 normal, no rubs GI: Abdomen soft, nontender, nondistended Extremities, no cyanosis or clubbing, no edema Skin: No rash or ulcer Neurology: Alert, awake, following commands, oriented Dialysis Access:  Assessment/Plan:  # Acute hypoxic respiratory failure/SOB: Presumably due to fluid overload.  Received dialysis this morning with 1.4 L UF.  Currently on oxygen .  We will try to UF with HD.  # ESRD: TTS however nonadherence with outpatient HD.  We will plan for next dialysis tomorrow.  HD catheter for the access.  # Hypertension: BP elevated.  Resume home medication.  UF with HD.  # Anemia of ESRD: Hemoglobin above goal.  No need for ESA.  # Metabolic Bone Disease: Monitor calcium  phosphorus level.  Resume home medication  # Hyperkalemia: Received dialysis.  Monitor lab.  Thank you for the consult, we will continue to follow.  Findlay Dagher Lansing Planas 09/22/2023, 3:27 PM

## 2023-09-22 NOTE — ED Notes (Signed)
 Patient ambulatory to restroom.  Steady gait noted once standing.  Patient reports she only uses oxygen  as needed at baseline

## 2023-09-22 NOTE — Progress Notes (Signed)
 Heart Failure Navigator Progress Note  Assessed for Heart & Vascular TOC clinic readiness.  Patient does not meet criteria due to ESRD on hemodialysis.   Navigator will sign off at this time.   Rhae Hammock, BSN, Scientist, clinical (histocompatibility and immunogenetics) Only

## 2023-09-23 ENCOUNTER — Other Ambulatory Visit: Payer: Self-pay

## 2023-09-23 DIAGNOSIS — I509 Heart failure, unspecified: Secondary | ICD-10-CM | POA: Diagnosis not present

## 2023-09-23 LAB — CBC
HCT: 33.7 % — ABNORMAL LOW (ref 36.0–46.0)
Hemoglobin: 10.5 g/dL — ABNORMAL LOW (ref 12.0–15.0)
MCH: 28.8 pg (ref 26.0–34.0)
MCHC: 31.2 g/dL (ref 30.0–36.0)
MCV: 92.6 fL (ref 80.0–100.0)
Platelets: 149 10*3/uL — ABNORMAL LOW (ref 150–400)
RBC: 3.64 MIL/uL — ABNORMAL LOW (ref 3.87–5.11)
RDW: 17 % — ABNORMAL HIGH (ref 11.5–15.5)
WBC: 3.4 10*3/uL — ABNORMAL LOW (ref 4.0–10.5)
nRBC: 0 % (ref 0.0–0.2)

## 2023-09-23 LAB — COMPREHENSIVE METABOLIC PANEL WITH GFR
ALT: 5 U/L (ref 0–44)
AST: 13 U/L — ABNORMAL LOW (ref 15–41)
Albumin: 3.1 g/dL — ABNORMAL LOW (ref 3.5–5.0)
Alkaline Phosphatase: 40 U/L (ref 38–126)
Anion gap: 17 — ABNORMAL HIGH (ref 5–15)
BUN: 44 mg/dL — ABNORMAL HIGH (ref 8–23)
CO2: 24 mmol/L (ref 22–32)
Calcium: 9.1 mg/dL (ref 8.9–10.3)
Chloride: 93 mmol/L — ABNORMAL LOW (ref 98–111)
Creatinine, Ser: 8.96 mg/dL — ABNORMAL HIGH (ref 0.44–1.00)
GFR, Estimated: 4 mL/min — ABNORMAL LOW (ref >60)
Glucose, Bld: 99 mg/dL (ref 70–99)
Potassium: 4.8 mmol/L (ref 3.5–5.1)
Sodium: 134 mmol/L — ABNORMAL LOW (ref 135–145)
Total Bilirubin: 0.6 mg/dL (ref 0.0–1.2)
Total Protein: 6.1 g/dL — ABNORMAL LOW (ref 6.5–8.1)

## 2023-09-23 LAB — MAGNESIUM: Magnesium: 2.1 mg/dL (ref 1.7–2.4)

## 2023-09-23 NOTE — ED Notes (Signed)
Placed patient back on the monitor patient is resting with call bell in reach

## 2023-09-23 NOTE — ED Notes (Signed)
 Pt has a 22G in her right hand. Nurse wanted to place a different line for am labs and to run the nitroglycerin  drip in. PT did not want a new line, nurse noted blood return from 22G but was hard to pull the blood out. Nurse didn't want to send labs from that line due risk of being  hemolyzed. PT also complained of a headache, nurse check BP and it was high. Nurse gave PRN metoprolol  for BP, nitroglycerin  kept the same due to headache and tylenol  given.

## 2023-09-23 NOTE — Progress Notes (Signed)
 [facility] NEPHROLOGY PROGRESS NOTE  Assessment/ Plan: Pt is a 73 y.o. yo female  with past medical history significant for hypertension, COPD, stroke, A-fib on Eliquis , ESRD on HD noncompliant with outpatient treatment presented with shortness of breath, hypertension and hyperkalemia seen as a consultation for the management of ESRD.  OP Dialysis Orders:  AF TTS  3.5h  B400  46kg   2k bath RIJ TDC  Heparin  none  ## Acute hypoxic respiratory failure/SOB: Presumably due to fluid overload.  Received dialysis yesterday with ultrafiltration and plan for another dialysis today.  UF as tolerated.  Wean oxygen  down gradually.  No SOB today.  # ESRD: TTS however nonadherence with outpatient HD.  Plan for HD today and then maintain TTS schedule.  # Hypertension: BP elevated.  Resume home medication.  UF with HD.   # Anemia of ESRD: Hemoglobin above goal.  No need for ESA.   # Metabolic Bone Disease: Monitor calcium  phosphorus level.  Resume home medication   # Hyperkalemia: Received dialysis.  Monitor lab.  Ok to discharge from renal perspective after dialysis today.  Communicated with primary team.  Subjective: Seen and examined.  Patient reported her breathing is much better.  Denies nausea, vomiting, chest pain no new event overnight. Objective Vital signs in last 24 hours: Vitals:   09/23/23 1000 09/23/23 1030 09/23/23 1043 09/23/23 1100  BP: (!) 165/90 (!) 156/86  (!) 150/89  Pulse: 75 71  70  Resp: 17 19  14   Temp:   98 F (36.7 C)   TempSrc:   Oral   SpO2: 98% 96%  97%  Weight:      Height:       Weight change:   Intake/Output Summary (Last 24 hours) at 09/23/2023 1150 Last data filed at 09/23/2023 1147 Gross per 24 hour  Intake 293.49 ml  Output --  Net 293.49 ml       Labs: RENAL PANEL Recent Labs  Lab 09/21/23 1821 09/23/23 0515  NA 137 134*  K 6.8* 4.8  CL 98 93*  CO2 18* 24  GLUCOSE 104* 99  BUN 64* 44*  CREATININE 11.10* 8.96*  CALCIUM   9.3 9.1  MG  --  2.1  ALBUMIN   --  3.1*    Liver Function Tests: Recent Labs  Lab 09/23/23 0515  AST 13*  ALT 5  ALKPHOS 40  BILITOT 0.6  PROT 6.1*  ALBUMIN  3.1*   No results for input(s): "LIPASE", "AMYLASE" in the last 168 hours. No results for input(s): "AMMONIA" in the last 168 hours. CBC: Recent Labs    08/31/23 0811 09/01/23 0242 09/02/23 0222 09/21/23 1821 09/23/23 0515  HGB 15.3*  15.6* 12.0 11.9* 12.9 10.5*  MCV  --  95.1 95.0 99.3 92.6    Cardiac Enzymes: No results for input(s): "CKTOTAL", "CKMB", "CKMBINDEX", "TROPONINI" in the last 168 hours. CBG: No results for input(s): "GLUCAP" in the last 168 hours.  Iron Studies: No results for input(s): "IRON", "TIBC", "TRANSFERRIN", "FERRITIN" in the last 72 hours. Studies/Results: DG Chest 2 View Result Date: 09/22/2023 CLINICAL DATA:  Shortness of breath and increased heart rate. EXAM: CHEST - 2 VIEW COMPARISON:  September 21, 2023 FINDINGS: There is stable right-sided venous catheter positioning. Multiple sternal wires are noted. The cardiac silhouette is enlarged and unchanged in size. Calcification of the aortic arch is present. Mild, stable right basilar atelectasis and/or infiltrate is seen. No pleural effusion or pneumothorax is identified. The visualized skeletal structures are unremarkable. IMPRESSION: 1.  Stable cardiomegaly and evidence of prior median sternotomy. 2. Mild, stable right basilar atelectasis and/or infiltrate. Follow-up to resolution is recommended. Electronically Signed   By: Virgle Grime M.D.   On: 09/22/2023 03:55   DG Chest Port 1 View Result Date: 09/21/2023 CLINICAL DATA:  Shortness of breath. EXAM: PORTABLE CHEST 1 VIEW COMPARISON:  August 31, 2023 FINDINGS: Multiple sternal wires are noted. The cardiac silhouette is enlarged and unchanged in size. There is mild to moderate severity calcification of the aortic arch. Mild right basilar atelectasis and/or infiltrate is seen. This is very mildly  increased in severity when compared to the prior study. No pleural effusion or pneumothorax is identified. The visualized skeletal structures are unremarkable. IMPRESSION: 1. Stable cardiomegaly with evidence of prior median sternotomy. 2. Mild right basilar atelectasis and/or infiltrate. Electronically Signed   By: Virgle Grime M.D.   On: 09/21/2023 19:25    Medications: Infusions:  nitroGLYCERIN  15 mcg/min (09/23/23 1147)    Scheduled Medications:  amLODipine   10 mg Oral Daily   apixaban   2.5 mg Oral BID   carvedilol   25 mg Oral BID WC   cholecalciferol   1,000 Units Oral q AM   cinacalcet   60 mg Oral Q T,Th,Sa-HD   cloNIDine   0.1 mg Oral BID   hydrALAZINE   100 mg Oral Q8H   pantoprazole   40 mg Oral BID   rosuvastatin   10 mg Oral q AM   sevelamer  carbonate  2.4 g Oral TID WC   sodium chloride  flush  3 mL Intravenous Q12H    have reviewed scheduled and prn medications.  Physical Exam: General:NAD, comfortable Heart:RRR, s1s2 nl Lungs:clear b/l, no crackle Abdomen:soft, Non-tender, non-distended Extremities:No edema Dialysis Access: TDC.  Orval Dortch Prasad Arbor Leer 09/23/2023,11:50 AM  LOS: 1 day

## 2023-09-23 NOTE — Progress Notes (Addendum)
 PROGRESS NOTE    Monique Henderson  GMW:[id number] DOB: November 15, 1950 DOA: 09/21/2023 PCP: Charle Congo, MD   73 y.o. female with medical history significant of PAF on Eliquis , ongoing tobacco use, hypertension, GERD, COPD, history of CVA with right hemiparesis, PSVT, ESRD on dialysis, dyslipidemia, HFrEF, and polysubstance abuse involving cocaine .  Patient was unable to attend dialysis on Saturday due to reported lack of transportation.  Initial BP at presentation was 203/112.  Potassium was 6.8, CO2 18, anion gap 21, BUN 64 and creatinine 11.1.  BNP elevated at greater than 4500 and troponin 73    Subjective: -Feels better overall, still short of breath with minimal exertion  Assessment and Plan:  Acute hypoxic respiratory failure Volume overload, chronic combined CHF -Last echo 1/25 with EF 45%, severe LVH, grade 2 DD, moderately reduced RV - Related to missed hemodialysis, sp extra HD yesterday - Still with dyspnea on minimal exertion, blood pressure remains significantly elevated, try to wean off nitro gtt. - Nephrology following, anticipate HD today  Accelerated hypertension - Blood pressure remains elevated despite HD yesterday, remains on nitro gtt., attempt to wean today, HD today - Continue amlodipine , clonidine , hydralazine , Coreg   Hyperkalemia Resolved with HD  ESRD on HD TTS As above  Paroxysmal atrial fibrillation chronically anticoagulated - Continue Coreg  and Eliquis , heart rate controlled  COPD Stable continue DuoNebs  Polysubstance abuse Counseled  History of CVA Continue Eliquis   DVT prophylaxis: Eliquis  Code Status: Full code Family Communication: None present Disposition Plan: Home tomorrow  Consultants:    Procedures:   Antimicrobials:    Objective: Vitals:   09/23/23 0730 09/23/23 0800 09/23/23 0830 09/23/23 0930  BP: (!) 152/85 (!) 162/89 (!) 165/90 (!) 152/94  Pulse: 73 72 74 68  Resp: (!) 21 (!) 22 (!) 22 (!) 24  Temp:       TempSrc:      SpO2: 96% 95% 97% 99%  Weight:      Height:        Intake/Output Summary (Last 24 hours) at 09/23/2023 1005 Last data filed at 09/23/2023 0404 Gross per 24 hour  Intake 215.42 ml  Output --  Net 215.42 ml   Filed Weights   09/21/23 1801  Weight: 42.2 kg    Examination:  General exam: Frail chronically ill-appearing ENT: Positive JVD Respiratory system: Decreased breath to the bases Cardiovascular system: S1 & S2 heard, RRR.  Abd: nondistended, soft and nontender.Normal bowel sounds heard. Central nervous system: Alert and oriented. No focal neurological deficits. Extremities: Trace edema Skin: No rashes Psychiatry:  Mood & affect appropriate.     Data Reviewed:   CBC: Recent Labs  Lab 09/21/23 1821 09/23/23 0515  WBC 7.7 3.4*  NEUTROABS 6.2  --   HGB 12.9 10.5*  HCT 43.7 33.7*  MCV 99.3 92.6  PLT 172 149*   Basic Metabolic Panel: Recent Labs  Lab 09/21/23 1821 09/23/23 0515  NA 137 134*  K 6.8* 4.8  CL 98 93*  CO2 18* 24  GLUCOSE 104* 99  BUN 64* 44*  CREATININE 11.10* 8.96*  CALCIUM  9.3 9.1  MG  --  2.1   GFR: Estimated Creatinine Clearance: 3.8 mL/min (A) (by C-G formula based on SCr of 8.96 mg/dL (H)). Liver Function Tests: Recent Labs  Lab 09/23/23 0515  AST 13*  ALT 5  ALKPHOS 40  BILITOT 0.6  PROT 6.1*  ALBUMIN  3.1*   No results for input(s): "LIPASE", "AMYLASE" in the last 168 hours. No results for input(s): "AMMONIA"  in the last 168 hours. Coagulation Profile: No results for input(s): "INR", "PROTIME" in the last 168 hours. Cardiac Enzymes: No results for input(s): "CKTOTAL", "CKMB", "CKMBINDEX", "TROPONINI" in the last 168 hours. BNP (last 3 results) No results for input(s): "PROBNP" in the last 8760 hours. HbA1C: No results for input(s): "HGBA1C" in the last 72 hours. CBG: No results for input(s): "GLUCAP" in the last 168 hours. Lipid Profile: No results for input(s): "CHOL", "HDL", "LDLCALC", "TRIG",  "CHOLHDL", "LDLDIRECT" in the last 72 hours. Thyroid  Function Tests: No results for input(s): "TSH", "T4TOTAL", "FREET4", "T3FREE", "THYROIDAB" in the last 72 hours. Anemia Panel: No results for input(s): "VITAMINB12", "FOLATE", "FERRITIN", "TIBC", "IRON", "RETICCTPCT" in the last 72 hours. Urine analysis:    Component Value Date/Time   COLORURINE AMBER (A) 12/27/2021 1840   APPEARANCEUR TURBID (A) 12/27/2021 1840   LABSPEC 1.013 12/27/2021 1840   PHURINE 8.0 12/27/2021 1840   GLUCOSEU NEGATIVE 12/27/2021 1840   GLUCOSEU NEG mg/dL 27/25/3664 4034   HGBUR MODERATE (A) 12/27/2021 1840   BILIRUBINUR NEGATIVE 12/27/2021 1840   BILIRUBINUR negative 05/04/2018 0910   KETONESUR NEGATIVE 12/27/2021 1840   PROTEINUR >=300 (A) 12/27/2021 1840   UROBILINOGEN 0.2 05/04/2018 0910   UROBILINOGEN 1 02/14/2014 0946   NITRITE NEGATIVE 12/27/2021 1840   LEUKOCYTESUR LARGE (A) 12/27/2021 1840   Sepsis Labs: @LABRCNTIP (procalcitonin:4,lacticidven:4)  ) Recent Results (from the past 240 hours)  Resp panel by RT-PCR (RSV, Flu A&B, Covid) Anterior Nasal Swab     Status: None   Collection Time: 09/22/23  3:10 AM   Specimen: Anterior Nasal Swab  Result Value Ref Range Status   SARS Coronavirus 2 by RT PCR NEGATIVE NEGATIVE Final   Influenza A by PCR NEGATIVE NEGATIVE Final   Influenza B by PCR NEGATIVE NEGATIVE Final    Comment: (NOTE) The Xpert Xpress SARS-CoV-2/FLU/RSV plus assay is intended as an aid in the diagnosis of influenza from Nasopharyngeal swab specimens and should not be used as a sole basis for treatment. Nasal washings and aspirates are unacceptable for Xpert Xpress SARS-CoV-2/FLU/RSV testing.  Fact Sheet for Patients: BloggerCourse.com  Fact Sheet for Healthcare Providers: SeriousBroker.it  This test is not yet approved or cleared by the [place]  FDA and has been authorized for detection and/or diagnosis of SARS-CoV-2  by FDA under an Emergency Use Authorization (EUA). This EUA will remain in effect (meaning this test can be used) for the duration of the COVID-19 declaration under Section 564(b)(1) of the Act, 21 U.S.C. section 360bbb-3(b)(1), unless the authorization is terminated or revoked.     Resp Syncytial Virus by PCR NEGATIVE NEGATIVE Final    Comment: (NOTE) Fact Sheet for Patients: BloggerCourse.com  Fact Sheet for Healthcare Providers: SeriousBroker.it  This test is not yet approved or cleared by the [place]  FDA and has been authorized for detection and/or diagnosis of SARS-CoV-2 by FDA under an Emergency Use Authorization (EUA). This EUA will remain in effect (meaning this test can be used) for the duration of the COVID-19 declaration under Section 564(b)(1) of the Act, 21 U.S.C. section 360bbb-3(b)(1), unless the authorization is terminated or revoked.  Performed at [facility], [address] [place], [place] [address]      Radiology Studies: DG Chest 2 View Result Date: 09/22/2023 CLINICAL DATA:  Shortness of breath and increased heart rate. EXAM: CHEST - 2 VIEW COMPARISON:  September 21, 2023 FINDINGS: There is stable right-sided venous catheter positioning. Multiple sternal wires are noted. The cardiac silhouette is enlarged and unchanged  in size. Calcification of the aortic arch is present. Mild, stable right basilar atelectasis and/or infiltrate is seen. No pleural effusion or pneumothorax is identified. The visualized skeletal structures are unremarkable. IMPRESSION: 1. Stable cardiomegaly and evidence of prior median sternotomy. 2. Mild, stable right basilar atelectasis and/or infiltrate. Follow-up to resolution is recommended. Electronically Signed   By: Virgle Grime M.D.   On: 09/22/2023 03:55   DG Chest Port 1 View Result Date: 09/21/2023 CLINICAL DATA:  Shortness of breath. EXAM: PORTABLE CHEST 1 VIEW  COMPARISON:  August 31, 2023 FINDINGS: Multiple sternal wires are noted. The cardiac silhouette is enlarged and unchanged in size. There is mild to moderate severity calcification of the aortic arch. Mild right basilar atelectasis and/or infiltrate is seen. This is very mildly increased in severity when compared to the prior study. No pleural effusion or pneumothorax is identified. The visualized skeletal structures are unremarkable. IMPRESSION: 1. Stable cardiomegaly with evidence of prior median sternotomy. 2. Mild right basilar atelectasis and/or infiltrate. Electronically Signed   By: Virgle Grime M.D.   On: 09/21/2023 19:25     Scheduled Meds:  amLODipine   10 mg Oral Daily   apixaban   2.5 mg Oral BID   carvedilol   25 mg Oral BID WC   cholecalciferol   1,000 Units Oral q AM   cinacalcet   60 mg Oral Q T,Th,Sa-HD   cloNIDine   0.1 mg Oral BID   hydrALAZINE   100 mg Oral Q8H   pantoprazole   40 mg Oral BID   rosuvastatin   10 mg Oral q AM   sevelamer  carbonate  2.4 g Oral TID WC   sodium chloride  flush  3 mL Intravenous Q12H   Continuous Infusions:  nitroGLYCERIN  40 mcg/min (09/23/23 0404)     LOS: 1 day    Time spent:    Deforest Fast, MD [facility]   09/23/2023, 10:05 AM

## 2023-09-24 DIAGNOSIS — J9601 Acute respiratory failure with hypoxia: Secondary | ICD-10-CM

## 2023-09-24 LAB — BASIC METABOLIC PANEL WITH GFR
Anion gap: 12 (ref 5–15)
BUN: 21 mg/dL (ref 8–23)
CO2: 29 mmol/L (ref 22–32)
Calcium: 8.7 mg/dL — ABNORMAL LOW (ref 8.9–10.3)
Chloride: 94 mmol/L — ABNORMAL LOW (ref 98–111)
Creatinine, Ser: 5.79 mg/dL — ABNORMAL HIGH (ref 0.44–1.00)
GFR, Estimated: 7 mL/min — ABNORMAL LOW (ref >60)
Glucose, Bld: 128 mg/dL — ABNORMAL HIGH (ref 70–99)
Potassium: 4.7 mmol/L (ref 3.5–5.1)
Sodium: 135 mmol/L (ref 135–145)

## 2023-09-24 LAB — HEPATITIS B SURFACE ANTIBODY, QUANTITATIVE: Hep B S AB Quant (Post): 37.9 m[IU]/mL

## 2023-09-24 MED ORDER — LIDOCAINE HCL (PF) 1 % IJ SOLN: 5.0000 mL | INTRAMUSCULAR | Status: DC | PRN

## 2023-09-24 MED ORDER — ALTEPLASE 2 MG IJ SOLR: 2.0000 mg | Freq: Once | INTRAMUSCULAR | Status: DC | PRN

## 2023-09-24 MED ORDER — ANTICOAGULANT SODIUM CITRATE 4% (200MG/5ML) IV SOLN: 5.0000 mL | Status: DC | PRN

## 2023-09-24 MED ORDER — CLONIDINE HCL 0.1 MG PO TABS
0.1000 mg | ORAL_TABLET | Freq: Three times a day (TID) | ORAL | Status: DC
  Filled 2023-09-24: qty 1

## 2023-09-24 MED ORDER — HEPARIN SODIUM (PORCINE) 1000 UNIT/ML DIALYSIS: 1000.0000 [IU] | INTRAMUSCULAR | Status: DC | PRN

## 2023-09-24 MED ORDER — HEPARIN SODIUM (PORCINE) 1000 UNIT/ML IJ SOLN
INTRAMUSCULAR | Status: AC
  Filled 2023-09-24: qty 4

## 2023-09-24 MED ORDER — PENTAFLUOROPROP-TETRAFLUOROETH EX AERO: 1.0000 | INHALATION_SPRAY | CUTANEOUS | Status: DC | PRN

## 2023-09-24 MED ORDER — HEPARIN SODIUM (PORCINE) 1000 UNIT/ML IJ SOLN: 1000.0000 [IU] | Freq: Once | INTRAMUSCULAR | Status: DC

## 2023-09-24 MED ORDER — HEPARIN SODIUM (PORCINE) 1000 UNIT/ML DIALYSIS: 20.0000 [IU]/kg | INTRAMUSCULAR | Status: DC | PRN

## 2023-09-24 MED ORDER — LIDOCAINE-PRILOCAINE 2.5-2.5 % EX CREA: 1.0000 | TOPICAL_CREAM | CUTANEOUS | Status: DC | PRN

## 2023-09-24 NOTE — Care Management Important Message (Signed)
 Important Message  Patient Details  Name: Monique Henderson MRN: [id number] Date of Birth: [birth year]   Important Message Given:  Yes - Medicare IM     Wynonia Hedges 09/24/2023, 3:42 PM

## 2023-09-24 NOTE — Progress Notes (Signed)
D/C order noted. Contacted [facility] to be advised of pt's d/c today and that pt should resume care tomorrow.   Olivia Canter Renal Navigator [phone]

## 2023-09-24 NOTE — Plan of Care (Signed)

## 2023-09-24 NOTE — TOC Transition Note (Signed)
 Transition of Care [facility]) - Discharge Note   Patient Details  Name: Monique Henderson MRN: [id number] Date of Birth: 21-Dec-1950  [facility] CM/SW Contact:  Eusebio High, RN Phone Number: 09/24/2023, 9:38 AM   Clinical Narrative:    Patient will DC to home today. There are no TOC needs. Patient will follow up as directed ion [facility]. Friend will transport home           Patient Goals and CMS Choice            Discharge Placement                       Discharge Plan and Services Additional resources added to the After Visit Summary for                                       Social Drivers of Health (SDOH) Interventions SDOH Screenings   Food Insecurity: No Food Insecurity (09/23/2023)  Housing: Low Risk  (09/23/2023)  Transportation Needs: No Transportation Needs (09/23/2023)  Utilities: Not At Risk (09/23/2023)  Depression (PHQ2-9): Low Risk  (06/17/2022)  Recent Concern: Depression (PHQ2-9) - Medium Risk (05/22/2022)  Financial Resource Strain: Low Risk  (04/14/2021)   Received from [facility], [facility]  Physical Activity: Inactive (04/14/2021)   Received from [facility], [facility]  Social Connections: Moderately Isolated (09/23/2023)  Stress: Stress Concern Present (04/14/2021)   Received from [facility], [facility]  Tobacco Use: Medium Risk (09/21/2023)     Readmission Risk Interventions    07/23/2023   12:26 PM 06/03/2023    3:11 PM 01/15/2023   12:35 PM  Readmission Risk Prevention Plan  Transportation Screening Complete Complete Complete  Medication Review (RN Care Manager) Referral to Pharmacy Referral to Pharmacy Referral to Pharmacy  PCP or Specialist appointment within 3-5 days of discharge  Complete Complete  HRI or Home Care Consult Complete Complete Complete  SW Recovery Care/Counseling Consult Complete Complete Complete  Palliative Care Screening Not Applicable Not Applicable Not Applicable  Skilled  Nursing Facility Not Applicable Not Applicable Not Applicable

## 2023-09-24 NOTE — Discharge Summary (Signed)
 ETHER GOEBEL WUJ:[id number] DOB: 12/28/1950 DOA: 09/21/2023  PCP: Charle Congo, MD  Admit date: 09/21/2023  Discharge date: 09/24/2023  Admitted From: Home   Disposition:  Home   Recommendations for Outpatient Follow-up:   Follow up with PCP in 1-2 weeks  PCP Please obtain BMP/CBC, 2 view CXR in 1week,  (see Discharge instructions)   PCP Please follow up on the following pending results:    Home Health: None   Equipment/Devices: None  Consultations: Nephrology Discharge Condition: Stable    CODE STATUS: Full    Diet Recommendation: Renal diet with 1.2 L fluid restriction per day    Chief Complaint  Patient presents with   Shortness of Breath     Brief history of present illness from the day of admission and additional interim summary    73 y.o. female with medical history significant of PAF on Eliquis , ongoing tobacco use, hypertension, GERD, COPD, history of CVA with right hemiparesis, PSVT, ESRD on dialysis, dyslipidemia, HFrEF, and polysubstance abuse involving cocaine .  Patient was unable to attend dialysis on Saturday due to reported lack of transportation.  Initial BP at presentation was 203/112.  Potassium was 6.8, CO2 18, anion gap 21, BUN 64 and creatinine 11.1.  BNP elevated at greater than 4500 and troponin 73                                                                    Hospital Course   Acute hypoxic respiratory failure Volume overload, chronic combined CHF -Last echo 1/25 with EF 45%, severe LVH, grade 2 DD, moderately reduced RV - Related to missed hemodialysis, sp extra HD yesterday - Now symptom-free on room air, her shortness of breath has now resolved, counseled on compliance with medications and HD treatment, good to go home will be discharged.   Accelerated hypertension -  Blood pressure remains elevated despite HD yesterday, patiently on nitro drip now titrated off of it, continue home regimen, counseled on compliance.   Hyperkalemia Resolved with HD   ESRD on HD TTS As above   Paroxysmal atrial fibrillation chronically anticoagulated - Continue Coreg  and Eliquis , heart rate controlled   COPD Stable continue DuoNebs   Polysubstance abuse Counseled   History of CVA Continue Eliquis     Discharge diagnosis     Active Problems:   Acute respiratory failure with hypoxia [facility])    Discharge instructions    Discharge Instructions     Discharge instructions   Complete by: As directed    Follow with Primary MD Charle Congo, MD in 7 days   Get CBC, CMP, 2 view Chest X ray -  checked next visit with your primary MD   Activity: As tolerated with Full fall precautions use walker/cane & assistance as needed  Disposition Home  Diet: Renal diet with 1.2 L fluid restriction per day  Special Instructions: If you have smoked or chewed Tobacco  in the last 2 yrs please stop smoking, stop any regular Alcohol   and or any Recreational drug use.  On your next visit with your primary care physician please Get Medicines reviewed and adjusted.  Please request your Prim.MD to go over all Hospital Tests and Procedure/Radiological results at the follow up, please get all Hospital records sent to your Prim MD by signing hospital release before you go home.  If you experience worsening of your admission symptoms, develop shortness of breath, life threatening emergency, suicidal or homicidal thoughts you must seek medical attention immediately by calling 911 or calling your MD immediately  if symptoms less severe.  You Must read complete instructions/literature along with all the possible adverse reactions/side effects for all the Medicines you take and that have been prescribed to you. Take any new Medicines after you have completely understood and accpet all  the possible adverse reactions/side effects.   Do not drive when taking Pain medications.  Do not take more than prescribed Pain, Sleep and Anxiety Medications  Wear Seat belts while driving.   Increase activity slowly   Complete by: As directed        Discharge Medications   Allergies as of 09/24/2023       Reactions   Shrimp [shellfish Allergy] Shortness Of Breath   Bactroban  [mupirocin ] Other (See Comments)   "Sores in nose"   Hydrocodone  Itching, Nausea And Vomiting, Nausea Only   Chlorhexidine  Gluconate Itching   Latex Itching   Latex gloves cause itchiness and a rash   Tylenol  [acetaminophen ] Itching   Takes Percocet at home 05/06/23   Zestril  [lisinopril ] Cough        Medication List     TAKE these medications    acetaminophen  325 MG tablet Commonly known as: TYLENOL  Take 650 mg by mouth every 6 (six) hours as needed for mild pain (pain score 1-3) or moderate pain (pain score 4-6).   albuterol  (2.5 MG/3ML) 0.083% nebulizer solution Commonly known as: PROVENTIL  Take 3 mLs (2.5 mg total) by nebulization every 6 (six) hours as needed for wheezing or shortness of breath.   amLODipine  10 MG tablet Commonly known as: NORVASC  Take 1 tablet (10 mg total) by mouth daily. What changed: when to take this   apixaban  2.5 MG Tabs tablet Commonly known as: ELIQUIS  Take 1 tablet (2.5 mg total) by mouth 2 (two) times daily.   carvedilol  25 MG tablet Commonly known as: COREG  Take 1 tablet (25 mg total) by mouth 2 (two) times daily with a meal.   cinacalcet  60 MG tablet Commonly known as: SENSIPAR  Take 60 mg by mouth every dialysis (Tuesday, Thursday, and Saturday).   cloNIDine  0.1 MG tablet Commonly known as: Catapres  Take 1 tablet (0.1 mg total) by mouth 2 (two) times daily. Do not take it before dialysis   hydrALAZINE  100 MG tablet Commonly known as: APRESOLINE  Take 1 tablet (100 mg total) by mouth every 8 (eight) hours. What changed: when to take this    isosorbide  mononitrate 30 MG 24 hr tablet Commonly known as: IMDUR  TAKE 1 TABLET (30MG ) ON DIALYSIS DAYS AND 2 TABLETS (60 MG) ON NON DIALYSIS DAYS.   Lokelma  10 g Pack packet Generic drug: sodium zirconium cyclosilicate  Take 10 g by mouth every Monday, Wednesday, and Friday.   naloxone  4 MG/0.1ML Liqd nasal spray kit Commonly known as: NARCAN  Place 1 spray  into the nose as needed (opioid overdose).   oxyCODONE -acetaminophen  10-325 MG tablet Commonly known as: PERCOCET Take 1 tablet by mouth 4 (four) times daily as needed for pain.   pantoprazole  40 MG tablet Commonly known as: PROTONIX  TAKE 1 TABLET (40 MG TOTAL) BY MOUTH 2 (TWO) TIMES DAILY.   rosuvastatin  10 MG tablet Commonly known as: CRESTOR  TAKE 1 TABLET (10 MG TOTAL) BY MOUTH DAILY FOR CHOLESTEROL What changed: See the new instructions.   senna-docusate 8.6-50 MG tablet Commonly known as: Senokot-S Take 1 tablet by mouth 2 (two) times daily between meals as needed for mild constipation.   sevelamer  carbonate 2.4 g Pack Commonly known as: RENVELA  Take 2.4 g by mouth 3 (three) times daily with meals.   traZODone  50 MG tablet Commonly known as: DESYREL  Take 25-50 mg by mouth at bedtime as needed for sleep.   Vitamin D -3 125 MCG (5000 UT) Tabs Take 1 tablet by mouth in the morning.         Follow-up Information     Charle Congo, MD. Schedule an appointment as soon as possible for a visit in 1 week(s).   Specialty: Internal Medicine Contact information: [address] [place] [address] [phone]         Charle Congo, MD .   Specialty: Internal Medicine Contact information: [address] [place] [address] [phone]                 Major procedures and Radiology Reports - PLEASE review detailed and final reports thoroughly  -        DG Chest 2 View Result Date: 09/22/2023 CLINICAL DATA:  Shortness of breath and increased heart rate. EXAM: CHEST - 2 VIEW  COMPARISON:  September 21, 2023 FINDINGS: There is stable right-sided venous catheter positioning. Multiple sternal wires are noted. The cardiac silhouette is enlarged and unchanged in size. Calcification of the aortic arch is present. Mild, stable right basilar atelectasis and/or infiltrate is seen. No pleural effusion or pneumothorax is identified. The visualized skeletal structures are unremarkable. IMPRESSION: 1. Stable cardiomegaly and evidence of prior median sternotomy. 2. Mild, stable right basilar atelectasis and/or infiltrate. Follow-up to resolution is recommended. Electronically Signed   By: Virgle Grime M.D.   On: 09/22/2023 03:55   DG Chest Port 1 View Result Date: 09/21/2023 CLINICAL DATA:  Shortness of breath. EXAM: PORTABLE CHEST 1 VIEW COMPARISON:  August 31, 2023 FINDINGS: Multiple sternal wires are noted. The cardiac silhouette is enlarged and unchanged in size. There is mild to moderate severity calcification of the aortic arch. Mild right basilar atelectasis and/or infiltrate is seen. This is very mildly increased in severity when compared to the prior study. No pleural effusion or pneumothorax is identified. The visualized skeletal structures are unremarkable. IMPRESSION: 1. Stable cardiomegaly with evidence of prior median sternotomy. 2. Mild right basilar atelectasis and/or infiltrate. Electronically Signed   By: Virgle Grime M.D.   On: 09/21/2023 19:25   DG Chest Portable 1 View Result Date: 08/31/2023 CLINICAL DATA:  Chest pain. EXAM: PORTABLE CHEST 1 VIEW COMPARISON:  Chest radiograph dated 08/21/2023. FINDINGS: Stable cardiomegaly. Stable right IJ CVC catheter tip projects over the right atrium. Prior median sternotomy. Aortic atherosclerosis. Bilateral interstitial and patchy bibasilar opacities, more pronounced on the right, are increased compared to the prior exam. Mild left basilar atelectasis. No pneumothorax. No acute osseous abnormality. IMPRESSION: 1. Increased bilateral  interstitial and patchy bibasilar opacities, more pronounced on the right, could represent worsening pulmonary edema or  infection. Recommend clinical correlation. 2. Stable cardiomegaly. Electronically Signed   By: Mannie Seek M.D.   On: 08/31/2023 09:55    Micro Results     Recent Results (from the past 240 hours)  Resp panel by RT-PCR (RSV, Flu A&B, Covid) Anterior Nasal Swab     Status: None   Collection Time: 09/22/23  3:10 AM   Specimen: Anterior Nasal Swab  Result Value Ref Range Status   SARS Coronavirus 2 by RT PCR NEGATIVE NEGATIVE Final   Influenza A by PCR NEGATIVE NEGATIVE Final   Influenza B by PCR NEGATIVE NEGATIVE Final    Comment: (NOTE) The Xpert Xpress SARS-CoV-2/FLU/RSV plus assay is intended as an aid in the diagnosis of influenza from Nasopharyngeal swab specimens and should not be used as a sole basis for treatment. Nasal washings and aspirates are unacceptable for Xpert Xpress SARS-CoV-2/FLU/RSV testing.  Fact Sheet for Patients: BloggerCourse.com  Fact Sheet for Healthcare Providers: SeriousBroker.it  This test is not yet approved or cleared by the [place]  FDA and has been authorized for detection and/or diagnosis of SARS-CoV-2 by FDA under an Emergency Use Authorization (EUA). This EUA will remain in effect (meaning this test can be used) for the duration of the COVID-19 declaration under Section 564(b)(1) of the Act, 21 U.S.C. section 360bbb-3(b)(1), unless the authorization is terminated or revoked.     Resp Syncytial Virus by PCR NEGATIVE NEGATIVE Final    Comment: (NOTE) Fact Sheet for Patients: BloggerCourse.com  Fact Sheet for Healthcare Providers: SeriousBroker.it  This test is not yet approved or cleared by the [place]  FDA and has been authorized for detection and/or diagnosis of SARS-CoV-2 by FDA under an Emergency Use  Authorization (EUA). This EUA will remain in effect (meaning this test can be used) for the duration of the COVID-19 declaration under Section 564(b)(1) of the Act, 21 U.S.C. section 360bbb-3(b)(1), unless the authorization is terminated or revoked.  Performed at [facility], [address] [place], [place] [address]     Today   Subjective    Airica Schwartzkopf today has no headache,no chest abdominal pain,no new weakness tingling or numbness, feels much better wants to go home today.      Objective   Blood pressure 155/80, pulse 70, temperature (P) 98.4 F (36.9 C), temperature source (P) Oral, resp. rate 19, height 5\' 3"  (1.6 m), weight 46 kg, SpO2 97%.   Intake/Output Summary (Last 24 hours) at 09/24/2023 0913 Last data filed at 09/24/2023 0216 Gross per 24 hour  Intake 83.02 ml  Output 3000 ml  Net -2916.98 ml    Exam  Awake Alert, No new F.N deficits,    [place].AT,PERRAL Supple Neck,   Symmetrical Chest wall movement, Good air movement bilaterally, CTAB RRR,No Gallops,   +ve B.Sounds, Abd Soft, Non tender,  No Cyanosis, Clubbing or edema    Data Review   Recent Labs  Lab 09/21/23 1821 09/23/23 0515  WBC 7.7 3.4*  HGB 12.9 10.5*  HCT 43.7 33.7*  PLT 172 149*  MCV 99.3 92.6  MCH 29.3 28.8  MCHC 29.5* 31.2  RDW 17.5* 17.0*  LYMPHSABS 0.5*  --   MONOABS 0.7  --   EOSABS 0.2  --   BASOSABS 0.1  --     Recent Labs  Lab 09/21/23 1821 09/21/23 2009 09/23/23 0515 09/24/23 0753  NA 137  --  134* 135  K 6.8*  --  4.8 4.7  CL 98  --  93* 94*  CO2 18*  --  24 29  ANIONGAP 21*  --  17* 12  GLUCOSE 104*  --  99 128*  BUN 64*  --  44* 21  CREATININE 11.10*  --  8.96* 5.79*  AST  --   --  13*  --   ALT  --   --  5  --   ALKPHOS  --   --  40  --   BILITOT  --   --  0.6  --   ALBUMIN   --   --  3.1*  --   BNP  --  >4,500.0*  --   --   MG  --   --  2.1  --   CALCIUM  9.3  --  9.1 8.7*    Total Time in preparing paper work, data evaluation and  todays exam - 35 minutes  Signature  -    Lynnwood Sauer M.D on 09/24/2023 at 9:13 AM   -  To page go to [email or web address]

## 2023-09-24 NOTE — Progress Notes (Signed)
 [facility] Progress Note   Subjective: Dressed and being discharged. Reminded to take meds and go to HD tomorrow. No C/Os.    Objective Vitals:   09/24/23 0216 09/24/23 0256 09/24/23 0414 09/24/23 0615  BP:  (!) 178/102  (!) 157/103  Pulse:  83 82 70  Resp:  18 20 19   Temp: (P) 98.4 F (36.9 C)     TempSrc: (P) Oral Oral    SpO2:  96% 94% 97%  Weight:   46 kg   Height:       Physical Exam General: Chronically ill appearing female in NAD Heart: S1,S2 RRR NO M/R/G Lungs: CTAB A/P Abdomen: NABS, NT Extremities: No LE edema Dialysis Access: [facility] drsg CDI    Additional Objective Labs: Basic Metabolic Panel: Recent Labs  Lab 09/21/23 1821 09/23/23 0515 09/24/23 0753  NA 137 134* 135  K 6.8* 4.8 4.7  CL 98 93* 94*  CO2 18* 24 29  GLUCOSE 104* 99 128*  BUN 64* 44* 21  CREATININE 11.10* 8.96* 5.79*  CALCIUM  9.3 9.1 8.7*   Liver Function Tests: Recent Labs  Lab 09/23/23 0515  AST 13*  ALT 5  ALKPHOS 40  BILITOT 0.6  PROT 6.1*  ALBUMIN  3.1*   No results for input(s): "LIPASE", "AMYLASE" in the last 168 hours. CBC: Recent Labs  Lab 09/21/23 1821 09/23/23 0515  WBC 7.7 3.4*  NEUTROABS 6.2  --   HGB 12.9 10.5*  HCT 43.7 33.7*  MCV 99.3 92.6  PLT 172 149*   Blood Culture    Component Value Date/Time   SDES BLOOD SITE NOT SPECIFIED 07/22/2023 1558   SPECREQUEST  07/22/2023 1558    BOTTLES DRAWN AEROBIC AND ANAEROBIC Blood Culture results may not be optimal due to an inadequate volume of blood received in culture bottles   CULT  07/22/2023 1558    NO GROWTH 5 DAYS Performed at [facility], [address], [place], [place] [address]    REPTSTATUS 07/27/2023 FINAL 07/22/2023 1558    Cardiac Enzymes: No results for input(s): "CKTOTAL", "CKMB", "CKMBINDEX", "TROPONINI" in the last 168 hours. CBG: No results for input(s): "GLUCAP" in the last 168 hours. Iron Studies: No results for input(s): "IRON", "TIBC", "TRANSFERRIN",  "FERRITIN" in the last 72 hours. @lablastinr3 @ Studies/Results: No results found. Medications:  nitroGLYCERIN  Stopped (09/23/23 1313)    amLODipine   10 mg Oral Daily   apixaban   2.5 mg Oral BID   carvedilol   25 mg Oral BID WC   cholecalciferol   1,000 Units Oral q AM   cinacalcet   60 mg Oral Q T,Th,Sa-HD   cloNIDine   0.1 mg Oral TID   heparin  sodium (porcine)  1,000 Units Intravenous Once   hydrALAZINE   100 mg Oral Q8H   pantoprazole   40 mg Oral BID   rosuvastatin   10 mg Oral q AM   sevelamer  carbonate  2.4 g Oral TID WC   sodium chloride  flush  3 mL Intravenous Q12H     OP Dialysis Orders:  AF TTS  3.5h  B400  46kg   2k bath RIJ TDC  Heparin  none   ## Acute hypoxic respiratory failure/SOB: Presumably due to fluid overload.  Received dialysis yesterday with ultrafiltration and plan for another dialysis today.  UF as tolerated.  Wean oxygen  down gradually.  No SOB today.   # ESRD: TTS however nonadherence with outpatient HD.  Continue TTS schedule at OP center.   # Hypertension: BP elevated.  Resume home medication.  UF  with HD.   # Anemia of ESRD: Hemoglobin above goal.  No need for ESA.   # Metabolic Bone Disease: Monitor calcium  phosphorus level.  Resume home medication   # Hyperkalemia: Received dialysis.  Monitor lab.   Ok to discharge from renal perspective after dialysis today.  Communicated with primary team.  Zulma Hitt. Quintavius Niebuhr NP-C 09/24/2023, 9:36 AM  [employer] [phone]

## 2023-09-24 NOTE — Discharge Instructions (Signed)
 Follow with Primary MD Charle Congo, MD in 7 days   Get CBC, CMP, 2 view Chest X ray -  checked next visit with your primary MD   Activity: As tolerated with Full fall precautions use walker/cane & assistance as needed  Disposition Home   Diet: Renal diet with 1.2 L fluid restriction per day  Special Instructions: If you have smoked or chewed Tobacco  in the last 2 yrs please stop smoking, stop any regular Alcohol   and or any Recreational drug use.  On your next visit with your primary care physician please Get Medicines reviewed and adjusted.  Please request your Prim.MD to go over all Hospital Tests and Procedure/Radiological results at the follow up, please get all Hospital records sent to your Prim MD by signing hospital release before you go home.  If you experience worsening of your admission symptoms, develop shortness of breath, life threatening emergency, suicidal or homicidal thoughts you must seek medical attention immediately by calling 911 or calling your MD immediately  if symptoms less severe.  You Must read complete instructions/literature along with all the possible adverse reactions/side effects for all the Medicines you take and that have been prescribed to you. Take any new Medicines after you have completely understood and accpet all the possible adverse reactions/side effects.   Do not drive when taking Pain medications.  Do not take more than prescribed Pain, Sleep and Anxiety Medications  Wear Seat belts while driving.

## 2023-09-24 NOTE — Discharge Planning (Addendum)
 [facility] Patient Discharge Orders- [facility] CLINIC: [facility] CORRECTED COPY-WEIGHT ADJUSTED  Patient's name: BRYLYNN HANSSEN Admit/DC Dates: 09/21/2023 - 09/24/2023  Discharge Diagnoses: Acute hypoxic respiratory failure Volume overload, chronic combined CHF  Non-compliance with HD  Aranesp : Given: No   Date and amount of last dose: NA  PRBC's Given: No Date/# of units: NA Last Hgb: 10.5 ESA dose for discharge: mircera 0 mcg IV q 2 weeks. Dose ESA per protocol IV Iron dose at discharge: per protocol   Heparin  change: NA  EDW Change: Yes New EDW: 43.5 kg  Bath Change: No  Access intervention/Change: No Details:  Hectorol /Calcitriol change: No  Discharge Labs: Calcium  8.7 Phosphorus 2.1 Albumin  3.1 K+ 4.8  IV Antibiotics: NA Details:  On Coumadin ?: No Last INR: Next INR: Managed By:   OTHER/APPTS/LAB ORDERS:    D/C Meds to be reconciled by nurse after every discharge.  Completed By: Jacobo Masters [facility] [phone]    Reviewed by: MD:______ RN_______

## 2023-09-25 ENCOUNTER — Telehealth: Payer: Self-pay | Admitting: Nurse Practitioner

## 2023-09-25 NOTE — Telephone Encounter (Signed)
 Transition of care contact from inpatient facility  Date of Discharge: 09/24/2023 Date of Contact: 09/25/2023 Method of contact: Phone  Attempted to contact patient to discuss transition of care from inpatient admission. Patient did not answer the phone. Message was left on the patient's voicemail with call back number [phone].

## 2023-10-04 ENCOUNTER — Ambulatory Visit: Payer: Medicare Other | Admitting: Cardiology

## 2023-10-05 ENCOUNTER — Ambulatory Visit: Payer: Medicare Other | Admitting: Cardiology

## 2023-10-11 ENCOUNTER — Other Ambulatory Visit: Payer: Self-pay

## 2023-10-11 ENCOUNTER — Observation Stay (HOSPITAL_COMMUNITY)
Admission: EM | Admit: 2023-10-11 | Discharge: 2023-10-13 | Discharge status: Against Medical Advice | Attending: Internal Medicine | Admitting: Internal Medicine

## 2023-10-11 DIAGNOSIS — Z7901 Long term (current) use of anticoagulants: Secondary | ICD-10-CM | POA: Insufficient documentation

## 2023-10-11 DIAGNOSIS — Z91040 Latex allergy status: Secondary | ICD-10-CM | POA: Diagnosis not present

## 2023-10-11 DIAGNOSIS — J9601 Acute respiratory failure with hypoxia: Secondary | ICD-10-CM | POA: Diagnosis not present

## 2023-10-11 DIAGNOSIS — I132 Hypertensive heart and chronic kidney disease with heart failure and with stage 5 chronic kidney disease, or end stage renal disease: Secondary | ICD-10-CM | POA: Diagnosis not present

## 2023-10-11 DIAGNOSIS — N186 End stage renal disease: Secondary | ICD-10-CM | POA: Diagnosis present

## 2023-10-11 DIAGNOSIS — I480 Paroxysmal atrial fibrillation: Secondary | ICD-10-CM | POA: Insufficient documentation

## 2023-10-11 DIAGNOSIS — J810 Acute pulmonary edema: Secondary | ICD-10-CM

## 2023-10-11 DIAGNOSIS — I161 Hypertensive emergency: Principal | ICD-10-CM

## 2023-10-11 DIAGNOSIS — Z87891 Personal history of nicotine dependence: Secondary | ICD-10-CM | POA: Insufficient documentation

## 2023-10-11 DIAGNOSIS — Z79899 Other long term (current) drug therapy: Secondary | ICD-10-CM | POA: Diagnosis not present

## 2023-10-11 DIAGNOSIS — Z992 Dependence on renal dialysis: Secondary | ICD-10-CM | POA: Insufficient documentation

## 2023-10-11 DIAGNOSIS — Z8673 Personal history of transient ischemic attack (TIA), and cerebral infarction without residual deficits: Secondary | ICD-10-CM | POA: Insufficient documentation

## 2023-10-11 DIAGNOSIS — I5022 Chronic systolic (congestive) heart failure: Secondary | ICD-10-CM | POA: Diagnosis not present

## 2023-10-11 DIAGNOSIS — I10 Essential (primary) hypertension: Secondary | ICD-10-CM | POA: Diagnosis present

## 2023-10-11 DIAGNOSIS — J449 Chronic obstructive pulmonary disease, unspecified: Secondary | ICD-10-CM | POA: Diagnosis not present

## 2023-10-11 DIAGNOSIS — R0602 Shortness of breath: Secondary | ICD-10-CM | POA: Diagnosis present

## 2023-10-11 DIAGNOSIS — E8770 Fluid overload, unspecified: Secondary | ICD-10-CM | POA: Diagnosis not present

## 2023-10-11 MED ORDER — LABETALOL HCL 5 MG/ML IV SOLN
10.0000 mg | Freq: Once | INTRAVENOUS | Status: AC
  Administered 2023-10-12: 10 mg via INTRAVENOUS
  Filled 2023-10-11: qty 4

## 2023-10-11 NOTE — ED Triage Notes (Signed)
 Pt BIB EMS from home w/ complaints of abdominal pain, CP, SOB, and elevated BP. Pt is a Tues/Thurs/Sat dialysis pt and states that she gets like this anytime she needs dialysis. 95% on 4L nasal canula, 228/119 BP, pain level 10/10. Pt refusing IV and blood work at this time.

## 2023-10-11 NOTE — ED Provider Notes (Signed)
 [place] [facility]ERGENCY DEPARTMENT AT [facility] Provider Note   CSN: [id number] Arrival date & time: 10/11/23  2337     History {Add pertinent medical, surgical, social history, OB history to HPI:1} Chief Complaint  Patient presents with   Shortness of Breath    SHARL AHLQUIST is a 73 y.o. female.  HPI     This is a 73 year old female with a history of end-stage renal disease on dialysis Tuesday, Thursday, Saturday who presents with shortness of breath, chest pain, abdominal pain.  Patient reports subacute onset of symptoms tonight.  She states that she often gets this way when she needs dialysis.  She is not on home oxygen  normally but was on 4 L by EMS.  No recent fevers or cough.  States that she had a full dialysis session on Saturday.  Home Medications Prior to Admission medications   Medication Sig Start Date End Date Taking? Authorizing Provider  acetaminophen  (TYLENOL ) 325 MG tablet Take 650 mg by mouth every 6 (six) hours as needed for mild pain (pain score 1-3) or moderate pain (pain score 4-6).    [provider]  albuterol  (PROVENTIL ) (2.5 MG/3ML) 0.083% nebulizer solution Take 3 mLs (2.5 mg total) by nebulization every 6 (six) hours as needed for wheezing or shortness of breath. 08/23/23   Pokhrel, Amador Bad, MD  amLODipine  (NORVASC ) 10 MG tablet Take 1 tablet (10 mg total) by mouth daily. Patient taking differently: Take 10 mg by mouth in the morning. 03/06/22   Setzer, Hamp Levine, PA-C  apixaban  (ELIQUIS ) 2.5 MG TABS tablet Take 1 tablet (2.5 mg total) by mouth 2 (two) times daily. 03/06/22   Setzer, Sandra J, PA-C  carvedilol  (COREG ) 25 MG tablet Take 1 tablet (25 mg total) by mouth 2 (two) times daily with a meal. 05/01/22 06/16/24  Ghimire, Estil Heman, MD  Cholecalciferol  (VITAMIN D -3) 125 MCG (5000 UT) TABS Take 1 tablet by mouth in the morning.    [provider]  cinacalcet  (SENSIPAR ) 60 MG tablet Take 60 mg by mouth every dialysis (Tuesday,  Thursday, and Saturday).    [provider]  cloNIDine  (CATAPRES ) 0.1 MG tablet Take 1 tablet (0.1 mg total) by mouth 2 (two) times daily. Do not take it before dialysis 09/02/23   Deforest Fast, MD  hydrALAZINE  (APRESOLINE ) 100 MG tablet Take 1 tablet (100 mg total) by mouth every 8 (eight) hours. Patient taking differently: Take 100 mg by mouth 2 (two) times daily. 12/18/22   Lucendia Rusk, MD  isosorbide  mononitrate (IMDUR ) 30 MG 24 hr tablet TAKE 1 TABLET (30MG ) ON DIALYSIS DAYS AND 2 TABLETS (60 MG) ON NON DIALYSIS DAYS. 01/12/23   Lucendia Rusk, MD  LOKELMA  10 g PACK packet Take 10 g by mouth every Monday, Wednesday, and Friday. 07/30/23   [provider]  naloxone  (NARCAN ) nasal spray 4 mg/0.1 mL Place 1 spray into the nose as needed (opioid overdose). 02/19/22   [provider]  oxyCODONE -acetaminophen  (PERCOCET) 10-325 MG tablet Take 1 tablet by mouth 4 (four) times daily as needed for pain. 03/12/22   [provider]  pantoprazole  (PROTONIX ) 40 MG tablet TAKE 1 TABLET (40 MG TOTAL) BY MOUTH 2 (TWO) TIMES DAILY. 05/13/23   Abraham Abo, MD  rosuvastatin  (CRESTOR ) 10 MG tablet TAKE 1 TABLET (10 MG TOTAL) BY MOUTH DAILY FOR CHOLESTEROL Patient taking differently: Take 10 mg by mouth in the morning. 08/07/22   Abraham Abo, MD  senna-docusate (SENOKOT-S) 8.6-50 MG tablet Take  1 tablet by mouth 2 (two) times daily between meals as needed for mild constipation. 08/14/22   Gonfa, Taye T, MD  sevelamer  carbonate (RENVELA ) 2.4 g PACK Take 2.4 g by mouth 3 (three) times daily with meals. 03/06/22   Setzer, Sandra J, PA-C  traZODone  (DESYREL ) 50 MG tablet Take 25-50 mg by mouth at bedtime as needed for sleep. 03/19/23   [provider]      Allergies    Shrimp [shellfish allergy], Bactroban  [mupirocin ], Hydrocodone , Chlorhexidine  gluconate, Latex, Tylenol  [acetaminophen ], and Zestril  [lisinopril ]    Review of Systems   Review of Systems   Constitutional:  Negative for fever.  Respiratory:  Positive for shortness of breath. Negative for cough.   Cardiovascular:  Positive for chest pain.  Gastrointestinal:  Positive for abdominal pain. Negative for nausea and vomiting.  All other systems reviewed and are negative.   Physical Exam Updated Vital Signs BP (!) 228/119 (BP Location: Left Arm)   Pulse (!) 107   Temp 99.7 F (37.6 C) (Oral)   Resp (!) 28   Ht 1.6 m (5\' 3" )   Wt 46 kg   SpO2 93%   BMI 17.96 kg/m  Physical Exam Vitals and nursing note reviewed.  Constitutional:      Comments: Ill-appearing  HENT:     Head: Normocephalic and atraumatic.  Eyes:     Pupils: Pupils are equal, round, and reactive to light.  Cardiovascular:     Rate and Rhythm: Regular rhythm. Tachycardia present.     Heart sounds: Normal heart sounds.  Pulmonary:     Effort: Tachypnea and respiratory distress present.     Breath sounds: Decreased breath sounds present. No wheezing.     Comments: Diminished breath sounds in all lung fields, nasal cannula in place, tachypnea, speaking in short sentences Abdominal:     General: Bowel sounds are normal.     Palpations: Abdomen is soft.  Musculoskeletal:     Cervical back: Neck supple.     Right lower leg: No edema.     Left lower leg: No edema.  Skin:    General: Skin is warm and dry.  Neurological:     Mental Status: She is alert and oriented to person, place, and time.  Psychiatric:        Mood and Affect: Mood normal.     ED Results / Procedures / Treatments   Labs (all labs ordered are listed, but only abnormal results are displayed) Labs Reviewed  CBC WITH DIFFERENTIAL/PLATELET  COMPREHENSIVE METABOLIC PANEL WITH GFR  I-STAT CHEM 8, ED    EKG EKG Interpretation Date/Time:  Monday Oct 11 2023 23:44:25 EDT Ventricular Rate:  110 PR Interval:  162 QRS Duration:  131 QT Interval:  378 QTC Calculation: 512 R Axis:   86  Text Interpretation: Sinus tachycardia Biatrial  enlargement Right bundle branch block Anteroseptal infarct, age indeterminate SImilar to prior Confirmed by Donita Furrow ([id number]) on 10/11/2023 11:49:45 PM  Radiology No results found.  Procedures Procedures  {Document cardiac monitor, telemetry assessment procedure when appropriate:1}  Medications Ordered in ED Medications  labetalol  (NORMODYNE ) injection 10 mg (has no administration in time range)    ED Course/ Medical Decision Making/ A&P   {   Click here for ABCD2, HEART and other calculatorsREFRESH Note before signing :1}                              Medical Decision  Making Amount and/or Complexity of Data Reviewed Labs: ordered. Radiology: ordered.  Risk Prescription drug management.   ***  {Document critical care time when appropriate:1} {Document review of labs and clinical decision tools ie heart score, Chads2Vasc2 etc:1}  {Document your independent review of radiology images, and any outside records:1} {Document your discussion with family members, caretakers, and with consultants:1} {Document social determinants of health affecting pt's care:1} {Document your decision making why or why not admission, treatments were needed:1} Final Clinical Impression(s) / ED Diagnoses Final diagnoses:  None    Rx / DC Orders ED Discharge Orders     None

## 2023-10-12 ENCOUNTER — Observation Stay (HOSPITAL_COMMUNITY)

## 2023-10-12 ENCOUNTER — Emergency Department (HOSPITAL_COMMUNITY)

## 2023-10-12 DIAGNOSIS — J9601 Acute respiratory failure with hypoxia: Secondary | ICD-10-CM | POA: Diagnosis not present

## 2023-10-12 DIAGNOSIS — E8770 Fluid overload, unspecified: Secondary | ICD-10-CM | POA: Diagnosis not present

## 2023-10-12 DIAGNOSIS — Z992 Dependence on renal dialysis: Secondary | ICD-10-CM | POA: Diagnosis not present

## 2023-10-12 DIAGNOSIS — N186 End stage renal disease: Secondary | ICD-10-CM | POA: Diagnosis not present

## 2023-10-12 LAB — CBC WITH DIFFERENTIAL/PLATELET
Abs Immature Granulocytes: 0.05 10*3/uL (ref 0.00–0.07)
Basophils Absolute: 0.1 10*3/uL (ref 0.0–0.1)
Basophils Relative: 1 %
Eosinophils Absolute: 0.2 10*3/uL (ref 0.0–0.5)
Eosinophils Relative: 2 %
HCT: 31.8 % — ABNORMAL LOW (ref 36.0–46.0)
Hemoglobin: 10.2 g/dL — ABNORMAL LOW (ref 12.0–15.0)
Immature Granulocytes: 1 %
Lymphocytes Relative: 8 %
Lymphs Abs: 0.8 10*3/uL (ref 0.7–4.0)
MCH: 28.3 pg (ref 26.0–34.0)
MCHC: 32.1 g/dL (ref 30.0–36.0)
MCV: 88.3 fL (ref 80.0–100.0)
Monocytes Absolute: 0.5 10*3/uL (ref 0.1–1.0)
Monocytes Relative: 6 %
Neutro Abs: 7.7 10*3/uL (ref 1.7–7.7)
Neutrophils Relative %: 82 %
Platelets: 165 10*3/uL (ref 150–400)
RBC: 3.6 MIL/uL — ABNORMAL LOW (ref 3.87–5.11)
RDW: 17.5 % — ABNORMAL HIGH (ref 11.5–15.5)
WBC: 9.3 10*3/uL (ref 4.0–10.5)
nRBC: 0 % (ref 0.0–0.2)

## 2023-10-12 LAB — I-STAT CHEM 8, ED
BUN: 65 mg/dL — ABNORMAL HIGH (ref 8–23)
Calcium, Ion: 1.07 mmol/L — ABNORMAL LOW (ref 1.15–1.40)
Chloride: 102 mmol/L (ref 98–111)
Creatinine, Ser: 11 mg/dL — ABNORMAL HIGH (ref 0.44–1.00)
Glucose, Bld: 116 mg/dL — ABNORMAL HIGH (ref 70–99)
HCT: 35 % — ABNORMAL LOW (ref 36.0–46.0)
Hemoglobin: 11.9 g/dL — ABNORMAL LOW (ref 12.0–15.0)
Potassium: 5.2 mmol/L — ABNORMAL HIGH (ref 3.5–5.1)
Sodium: 138 mmol/L (ref 135–145)
TCO2: 26 mmol/L (ref 22–32)

## 2023-10-12 LAB — COMPREHENSIVE METABOLIC PANEL WITH GFR
ALT: 16 U/L (ref 0–44)
AST: 24 U/L (ref 15–41)
Albumin: 4.1 g/dL (ref 3.5–5.0)
Alkaline Phosphatase: 70 U/L (ref 38–126)
Anion gap: 15 (ref 5–15)
BUN: 59 mg/dL — ABNORMAL HIGH (ref 8–23)
CO2: 23 mmol/L (ref 22–32)
Calcium: 9.4 mg/dL (ref 8.9–10.3)
Chloride: 100 mmol/L (ref 98–111)
Creatinine, Ser: 10.71 mg/dL — ABNORMAL HIGH (ref 0.44–1.00)
GFR, Estimated: 3 mL/min — ABNORMAL LOW (ref >60)
Glucose, Bld: 119 mg/dL — ABNORMAL HIGH (ref 70–99)
Potassium: 5 mmol/L (ref 3.5–5.1)
Sodium: 138 mmol/L (ref 135–145)
Total Bilirubin: 0.9 mg/dL (ref 0.0–1.2)
Total Protein: 8 g/dL (ref 6.5–8.1)

## 2023-10-12 LAB — TROPONIN I (HIGH SENSITIVITY)
Troponin I (High Sensitivity): 73 ng/L — ABNORMAL HIGH (ref <18)
Troponin I (High Sensitivity): 75 ng/L — ABNORMAL HIGH (ref <18)
Troponin I (High Sensitivity): 76 ng/L — ABNORMAL HIGH (ref <18)
Troponin I (High Sensitivity): 88 ng/L — ABNORMAL HIGH (ref <18)

## 2023-10-12 LAB — HEPATITIS B SURFACE ANTIGEN: Hepatitis B Surface Ag: NONREACTIVE

## 2023-10-12 LAB — BRAIN NATRIURETIC PEPTIDE: B Natriuretic Peptide: >4500 pg/mL — ABNORMAL HIGH (ref 0.0–100.0)

## 2023-10-12 MED ORDER — APIXABAN 2.5 MG PO TABS
2.5000 mg | ORAL_TABLET | Freq: Two times a day (BID) | ORAL | Status: DC
  Administered 2023-10-12 (×2): 2.5 mg via ORAL
  Filled 2023-10-12 (×3): qty 1

## 2023-10-12 MED ORDER — CLONIDINE HCL 0.1 MG PO TABS
0.1000 mg | ORAL_TABLET | Freq: Once | ORAL | Status: AC
Start: 2023-10-12 — End: 2023-10-12
  Administered 2023-10-12: 0.1 mg via ORAL
  Filled 2023-10-12: qty 1

## 2023-10-12 MED ORDER — HYDRALAZINE HCL 20 MG/ML IJ SOLN
5.0000 mg | Freq: Once | INTRAMUSCULAR | Status: AC
  Administered 2023-10-12: 5 mg via INTRAVENOUS
  Filled 2023-10-12: qty 1

## 2023-10-12 MED ORDER — NALOXONE HCL 4 MG/0.1ML NA LIQD: 1.0000 | NASAL | Status: DC | PRN

## 2023-10-12 MED ORDER — LIDOCAINE-PRILOCAINE 2.5-2.5 % EX CREA: 1.0000 | TOPICAL_CREAM | CUTANEOUS | Status: DC | PRN

## 2023-10-12 MED ORDER — HYDROMORPHONE HCL 1 MG/ML IJ SOLN
1.0000 mg | Freq: Once | INTRAMUSCULAR | Status: AC
  Administered 2023-10-12: 1 mg via INTRAVENOUS
  Filled 2023-10-12: qty 1

## 2023-10-12 MED ORDER — ALTEPLASE 2 MG IJ SOLR: 2.0000 mg | Freq: Once | INTRAMUSCULAR | Status: DC | PRN

## 2023-10-12 MED ORDER — NITROGLYCERIN 0.4 MG SL SUBL: 0.4000 mg | SUBLINGUAL_TABLET | SUBLINGUAL | Status: DC | PRN

## 2023-10-12 MED ORDER — CLONIDINE HCL 0.1 MG PO TABS: 0.1000 mg | ORAL_TABLET | Freq: Two times a day (BID) | ORAL | Status: DC

## 2023-10-12 MED ORDER — HEPARIN SODIUM (PORCINE) 1000 UNIT/ML IJ SOLN
INTRAMUSCULAR | Status: AC
  Filled 2023-10-12: qty 4

## 2023-10-12 MED ORDER — OXYCODONE-ACETAMINOPHEN 10-325 MG PO TABS: 1.0000 | ORAL_TABLET | Freq: Four times a day (QID) | ORAL | Status: DC | PRN

## 2023-10-12 MED ORDER — SEVELAMER CARBONATE 2.4 G PO PACK
2.4000 g | PACK | Freq: Three times a day (TID) | ORAL | Status: DC
  Administered 2023-10-12: 2.4 g via ORAL
  Filled 2023-10-12 (×3): qty 1

## 2023-10-12 MED ORDER — OXYCODONE-ACETAMINOPHEN 5-325 MG PO TABS
1.0000 | ORAL_TABLET | ORAL | Status: DC | PRN
  Administered 2023-10-12: 1 via ORAL
  Filled 2023-10-12: qty 1

## 2023-10-12 MED ORDER — ACETAMINOPHEN 650 MG RE SUPP: 650.0000 mg | Freq: Four times a day (QID) | RECTAL | Status: DC | PRN

## 2023-10-12 MED ORDER — HYDROXYZINE HCL 10 MG PO TABS: 10.0000 mg | ORAL_TABLET | Freq: Three times a day (TID) | ORAL | Status: DC | PRN

## 2023-10-12 MED ORDER — SENNOSIDES-DOCUSATE SODIUM 8.6-50 MG PO TABS: 1.0000 | ORAL_TABLET | Freq: Two times a day (BID) | ORAL | Status: DC | PRN

## 2023-10-12 MED ORDER — CLONIDINE HCL 0.1 MG PO TABS
0.1000 mg | ORAL_TABLET | Freq: Every day | ORAL | Status: DC
  Administered 2023-10-12: 0.1 mg via ORAL
  Filled 2023-10-12: qty 1

## 2023-10-12 MED ORDER — HEPARIN SODIUM (PORCINE) 1000 UNIT/ML DIALYSIS: 2000.0000 [IU] | Freq: Once | INTRAMUSCULAR | Status: DC

## 2023-10-12 MED ORDER — ALBUTEROL SULFATE (2.5 MG/3ML) 0.083% IN NEBU: 2.5000 mg | INHALATION_SOLUTION | Freq: Four times a day (QID) | RESPIRATORY_TRACT | Status: DC | PRN

## 2023-10-12 MED ORDER — PANTOPRAZOLE SODIUM 40 MG PO TBEC
40.0000 mg | DELAYED_RELEASE_TABLET | Freq: Two times a day (BID) | ORAL | Status: DC
  Administered 2023-10-12 (×2): 40 mg via ORAL
  Filled 2023-10-12 (×2): qty 1

## 2023-10-12 MED ORDER — ISOSORBIDE MONONITRATE ER 60 MG PO TB24
60.0000 mg | ORAL_TABLET | ORAL | Status: DC
  Filled 2023-10-12: qty 1

## 2023-10-12 MED ORDER — OXYCODONE HCL 5 MG PO TABS
5.0000 mg | ORAL_TABLET | ORAL | Status: DC | PRN
  Administered 2023-10-12: 5 mg via ORAL
  Filled 2023-10-12: qty 1

## 2023-10-12 MED ORDER — ACETAMINOPHEN 325 MG PO TABS: 650.0000 mg | ORAL_TABLET | Freq: Four times a day (QID) | ORAL | Status: DC | PRN

## 2023-10-12 MED ORDER — HYDRALAZINE HCL 20 MG/ML IJ SOLN
10.0000 mg | Freq: Four times a day (QID) | INTRAMUSCULAR | Status: DC | PRN
  Administered 2023-10-12 – 2023-10-13 (×2): 10 mg via INTRAVENOUS
  Filled 2023-10-12: qty 1

## 2023-10-12 MED ORDER — CINACALCET HCL 30 MG PO TABS: 60.0000 mg | ORAL_TABLET | ORAL | Status: DC

## 2023-10-12 MED ORDER — ROSUVASTATIN CALCIUM 5 MG PO TABS
10.0000 mg | ORAL_TABLET | Freq: Every morning | ORAL | Status: DC
  Administered 2023-10-12: 10 mg via ORAL
  Filled 2023-10-12 (×2): qty 2

## 2023-10-12 MED ORDER — ANTICOAGULANT SODIUM CITRATE 4% (200MG/5ML) IV SOLN: 5.0000 mL | Status: DC | PRN

## 2023-10-12 MED ORDER — SODIUM ZIRCONIUM CYCLOSILICATE 10 G PO PACK
10.0000 g | PACK | ORAL | Status: DC
  Filled 2023-10-12: qty 1

## 2023-10-12 MED ORDER — MORPHINE SULFATE (PF) 2 MG/ML IV SOLN: 2.0000 mg | INTRAVENOUS | Status: DC | PRN

## 2023-10-12 MED ORDER — TRAZODONE HCL 50 MG PO TABS: 25.0000 mg | ORAL_TABLET | Freq: Every evening | ORAL | Status: DC | PRN

## 2023-10-12 MED ORDER — HEPARIN SODIUM (PORCINE) 1000 UNIT/ML IJ SOLN
3200.0000 [IU] | Freq: Once | INTRAMUSCULAR | Status: AC
  Administered 2023-10-12: 3800 [IU]

## 2023-10-12 MED ORDER — CLONIDINE HCL 0.1 MG PO TABS
0.1000 mg | ORAL_TABLET | ORAL | Status: DC
  Filled 2023-10-12: qty 1

## 2023-10-12 MED ORDER — NITROGLYCERIN 2 % TD OINT
1.0000 [in_us] | TOPICAL_OINTMENT | Freq: Once | TRANSDERMAL | Status: AC
  Administered 2023-10-12: 1 [in_us] via TOPICAL
  Filled 2023-10-12: qty 1

## 2023-10-12 MED ORDER — ISOSORBIDE MONONITRATE ER 30 MG PO TB24: 30.0000 mg | ORAL_TABLET | ORAL | Status: DC

## 2023-10-12 MED ORDER — CARVEDILOL 12.5 MG PO TABS
25.0000 mg | ORAL_TABLET | Freq: Two times a day (BID) | ORAL | Status: DC
  Administered 2023-10-12: 25 mg via ORAL
  Filled 2023-10-12 (×2): qty 2

## 2023-10-12 MED ORDER — PENTAFLUOROPROP-TETRAFLUOROETH EX AERO: 1.0000 | INHALATION_SPRAY | CUTANEOUS | Status: DC | PRN

## 2023-10-12 MED ORDER — HEPARIN SODIUM (PORCINE) 1000 UNIT/ML DIALYSIS: 1000.0000 [IU] | INTRAMUSCULAR | Status: DC | PRN

## 2023-10-12 MED ORDER — LIDOCAINE HCL (PF) 1 % IJ SOLN: 5.0000 mL | INTRAMUSCULAR | Status: DC | PRN

## 2023-10-12 MED ORDER — AMLODIPINE BESYLATE 10 MG PO TABS
10.0000 mg | ORAL_TABLET | Freq: Every morning | ORAL | Status: DC
  Administered 2023-10-12: 10 mg via ORAL
  Filled 2023-10-12 (×2): qty 1

## 2023-10-12 MED ORDER — HYDRALAZINE HCL 50 MG PO TABS
100.0000 mg | ORAL_TABLET | Freq: Three times a day (TID) | ORAL | Status: DC
  Administered 2023-10-12 – 2023-10-13 (×3): 100 mg via ORAL
  Filled 2023-10-12 (×3): qty 2

## 2023-10-12 MED ORDER — VITAMIN D 25 MCG (1000 UNIT) PO TABS
1000.0000 [IU] | ORAL_TABLET | Freq: Every day | ORAL | Status: DC
  Administered 2023-10-12: 1000 [IU] via ORAL
  Filled 2023-10-12 (×3): qty 1

## 2023-10-12 MED ORDER — SODIUM ZIRCONIUM CYCLOSILICATE 10 G PO PACK
10.0000 g | PACK | Freq: Once | ORAL | Status: AC
  Administered 2023-10-12: 10 g via ORAL
  Filled 2023-10-12: qty 1

## 2023-10-12 MED ORDER — HYDRALAZINE HCL 20 MG/ML IJ SOLN
INTRAMUSCULAR | Status: AC
  Filled 2023-10-12: qty 1

## 2023-10-12 NOTE — Progress Notes (Addendum)
 Patient arrived to 6N14 from ED with a 0/10 pain. On 4L nasal cannula, no complaints of SOB or chest pain. Bed in lowest position, call light within reach.

## 2023-10-12 NOTE — Progress Notes (Signed)
 Patient was complaining of a 10/10 pain located in her chest, no complaints of pain radiating anywhere or SOB, lethargic, or diaphoretic. Vitals stable with elevated BP, oxygen  at 90% placed on nasal cannula 4L and went up to 100%. Pain medication given, EKG done and placed in chart. MD Lyndon Santiago made aware and new orders placed.

## 2023-10-12 NOTE — H&P (Signed)
 History and Physical    Patient: Monique Henderson JYN:[id number] DOB: March 23, 1951 DOA: 10/11/2023 DOS: the patient was seen and examined on 10/12/2023 PCP: Charle Congo, MD  Patient coming from: Home  Chief Complaint:  Chief Complaint  Patient presents with   Shortness of Breath   HPI: Monique Henderson is a 73 y.o. 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  She reports her last dialysis session was Saturday.  In the emergency department, she was hypertensive up to 255/141.  Patient denies headache, new focal neurologic deficit or vision change, abdominal pain, bloody stools, hematuria, chest pain.  She was placed on 4 L nasal cannula, no desaturations noted. She was given multiple antihypertensives in the emergency department without any improvement in blood pressure. ED provider discussed with on-call nephrologist, plan for dialysis in the morning.  Review of Systems: Review of Systems  Constitutional:  Negative for chills, diaphoresis, fever, malaise/fatigue and weight loss.  Eyes:  Negative for blurred vision and double vision.  Respiratory:  Positive for cough and shortness of breath. Negative for sputum production and wheezing.   Cardiovascular:  Negative for chest pain, palpitations and leg swelling.  Gastrointestinal:  Negative for abdominal pain, constipation, diarrhea, nausea and vomiting.  Genitourinary:  Negative for dysuria and frequency.  Musculoskeletal:  Negative for myalgias and neck pain.  Neurological:  Positive for sensory change (chronic RUE) and focal weakness (chronic RUE). Negative for tingling.    Past Medical History:  Diagnosis Date   Acute cardioembolic stroke (HCC) 02/26/2022   AF (paroxysmal  atrial fibrillation) (HCC) 05/29/2019   on Coumadin    Aortic atherosclerosis (HCC) 07/05/2019   Aortic dissection (HCC) 04/04/2019   s/p repair   Bone spur 2008   Right calcaneal foot spur   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 Lillette Reid; S/P re-excision of superior and inferior margins 10/27/2012.    Dissection of aorta (HCC) 04/03/2019   ESRD on hemodialysis (HCC) 05/29/2019   Essential hypertension 09/16/2006   GERD 09/16/2006   Hepatitis C    treated and RNA confirmed not detectable 01/2017   Insomnia 03/14/2015   Malnutrition of moderate degree 05/19/2019   Non compliance with medical treatment 12/04/2017   Normocytic anemia    With thrombocytosis   Osteoarthritis    Right ureteral stone 2002   S/P lumbar spinal fusion 01/18/2014   S/P lumbar decompressive laminectomy, fusion, and plating for lumbar spinal stenosis on 05/27/2009 by Dr. Isadora Mar.  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 Isadora Mar.     Stroke (cerebrum) [facility])    Stroke (HCC) 02/23/2022   SVT (supraventricular tachycardia) (HCC) 08/28/2019   Tobacco use  disorder 04/19/2009   Uterine fibroid    Wears dentures    top   Past Surgical History:  Procedure Laterality Date   ANTERIOR LAT LUMBAR FUSION N/A 01/18/2014   Procedure:  ANTERIOR LATERAL LUMBAR FUSION LUMBAR TWO-THREE;  Surgeon: Isadora Mar, MD;  Location: [facility];  Service: Neurosurgery;  Laterality: N/A;   ANTERIOR LUMBAR FUSION  01/18/2014   AV FISTULA PLACEMENT Left 04/20/2019   Procedure: ARTERIOVENOUS (AV) FISTULA CREATION;  Surgeon: Adine Hoof, MD;  Location: [facility] OR;  Service: Vascular;  Laterality: Left;   BACK SURGERY     BREAST LUMPECTOMY Left 01/2003   BREAST LUMPECTOMY Right 2014   BREAST LUMPECTOMY WITH NEEDLE LOCALIZATION AND AXILLARY SENTINEL LYMPH NODE BX Right 10/13/2012   Procedure: BREAST LUMPECTOMY WITH NEEDLE LOCALIZATION;  Surgeon: Mayme Spearman, MD;  Location: [facility];  Service: General;  Laterality: Right;  Right breast wire localized lumpectomy   DIALYSIS/PERMA CATHETER INSERTION N/A 05/28/2023   Procedure: DIALYSIS/PERMA CATHETER INSERTION;  Surgeon: Patrick Boor, MD;  Location: [facility];  Service: Cardiovascular;  Laterality: N/A;   INSERTION OF DIALYSIS CATHETER Right 04/20/2019   Procedure: INSERTION OF DIALYSIS CATHETER, right internal jugular;  Surgeon: Adine Hoof, MD;  Location[facility] OR;  Service: Vascular;  Laterality: Right;   INSERTION OF DIALYSIS CATHETER Right 09/24/2020   Procedure: INSERTION OF TUNNELED DIALYSIS CATHETER;  Surgeon: Mayo Speck, MD;  Location: [facility] OR;  Service: Vascular;  Laterality: Right;   IR FLUORO GUIDE CV LINE RIGHT  09/22/2020   IR THORACENTESIS ASP PLEURAL SPACE W/IMG GUIDE  05/19/2019   IR US  GUIDE VASC ACCESS LEFT  09/22/2020   IR US  GUIDE VASC ACCESS RIGHT  09/22/2020   IR VENOCAVAGRAM SVC  09/22/2020   LAMINECTOMY  05/27/2009   Lumbar decompressive laminectomy, fusion and plating for lumbar spinal stensosis   LIGATION OF ARTERIOVENOUS  FISTULA Left 09/24/2020   Procedure: LIGATION OF LEFT ARM ARTERIOVENOUS  FISTULA;  Surgeon: Mayo Speck, MD;  Location: [facility] OR;  Service: Vascular;  Laterality: Left;   LUMBAR LAMINECTOMY/DECOMPRESSION  MICRODISCECTOMY Left 03/23/2013   Procedure: LUMBAR LAMINECTOMY/DECOMPRESSION MICRODISCECTOMY 1 LEVEL;  Surgeon: Isadora Mar, MD;  Location: [facility];  Service: Neurosurgery;  Laterality: Left;  LUMBAR LAMINECTOMY/DECOMPRESSION MICRODISCECTOMY 1 LEVEL   MASTECTOMY, PARTIAL Left 02/26/2003   ; S/P re-excision of cranial and lateral margins 04/19/2003.    RE-EXCISION OF BREAST CANCER,SUPERIOR MARGINS Right 10/27/2012   Procedure: RE-EXCISION OF BREAST CANCER,SUPERIOR and inferior MARGINS;  Surgeon: Mayme Spearman, MD;  [facility];  Service: General;  Laterality: Right;   RE-EXCISION OF BREAST LUMPECTOMY Left 04/2003   TEE WITHOUT CARDIOVERSION N/A 04/04/2019   Procedure: Transesophageal Echocardiogram (Tee);  Surgeon: Rudine Cos, MD;  Location: [facility] OR;  Service: Open Heart Surgery;  Laterality: N/A;   THORACIC AORTIC ANEURYSM REPAIR N/A 04/04/2019   Procedure: THORACIC ASCENDING ANEURYSM REPAIR (AAA)  USING 28 MM X 30 CM HEMASHIELD PLATINUM VASCULAR GRAFT;  Surgeon: Rudine Cos, MD;  Location: [facility] OR;  Service: Open Heart Surgery;  Laterality: N/A;   Social History:  reports that she quit smoking about 3 years ago. Her smoking use included cigarettes. She started smoking about 47 years ago. She has a 11 pack-year smoking history. She has never used smokeless tobacco. She reports that she does not currently use alcohol  after a past usage of about 2.0 standard drinks of alcohol  per week. She reports that  she does not currently use drugs after having used the following drugs: Cocaine .  Allergies  Allergen Reactions   Shrimp [Shellfish Allergy] Shortness Of Breath   Bactroban  [Mupirocin ] Other (See Comments)    "Sores in nose"   Hydrocodone  Itching, Nausea And Vomiting and Nausea Only   Chlorhexidine  Gluconate Itching   Latex Itching    Latex gloves cause itchiness and a rash   Tylenol  [Acetaminophen ] Itching    Takes Percocet at home 05/06/23   Zestril  [Lisinopril ] Cough     Family History  Problem Relation Age of Onset   Colon cancer Mother 80   Hypertension Mother    Diabetes Sister 68   Hypertension Sister    Diabetes Brother    Hypertension Brother    Diabetes Brother    Hypertension Brother    Kidney disease Son        s/p renal transplant   Hypertension Son    Diabetes Son    Multiple sclerosis Son    Bone cancer Sister 23   Breast cancer Neg Hx    Cervical cancer Neg Hx     Prior to Admission medications   Medication Sig Start Date End Date Taking? Authorizing Provider  acetaminophen  (TYLENOL ) 325 MG tablet Take 650 mg by mouth every 6 (six) hours as needed for mild pain (pain score 1-3) or moderate pain (pain score 4-6).    [provider]  albuterol  (PROVENTIL ) (2.5 MG/3ML) 0.083% nebulizer solution Take 3 mLs (2.5 mg total) by nebulization every 6 (six) hours as needed for wheezing or shortness of breath. 08/23/23   Pokhrel, Laxman, MD  amLODipine  (NORVASC ) 10 MG tablet Take 1 tablet (10 mg total) by mouth daily. Patient taking differently: Take 10 mg by mouth in the morning. 03/06/22   Setzer, Hamp Levine, PA-C  apixaban  (ELIQUIS ) 2.5 MG TABS tablet Take 1 tablet (2.5 mg total) by mouth 2 (two) times daily. 03/06/22   Setzer, Sandra J, PA-C  carvedilol  (COREG ) 25 MG tablet Take 1 tablet (25 mg total) by mouth 2 (two) times daily with a meal. 05/01/22 06/16/24  Ghimire, Estil Heman, MD  Cholecalciferol  (VITAMIN D -3) 125 MCG (5000 UT) TABS Take 1 tablet by mouth in the morning.    [provider]  cinacalcet  (SENSIPAR ) 60 MG tablet Take 60 mg by mouth every dialysis (Tuesday, Thursday, and Saturday).    [provider]  cloNIDine  (CATAPRES ) 0.1 MG tablet Take 1 tablet (0.1 mg total) by mouth 2 (two) times daily. Do not take it before dialysis 09/02/23   Deforest Fast, MD  hydrALAZINE  (APRESOLINE ) 100 MG tablet Take 1 tablet (100 mg total) by mouth every 8 (eight) hours. Patient taking differently: Take 100 mg by mouth 2  (two) times daily. 12/18/22   Lucendia Rusk, MD  isosorbide  mononitrate (IMDUR ) 30 MG 24 hr tablet TAKE 1 TABLET (30MG ) ON DIALYSIS DAYS AND 2 TABLETS (60 MG) ON NON DIALYSIS DAYS. 01/12/23   Lucendia Rusk, MD  LOKELMA  10 g PACK packet Take 10 g by mouth every Monday, Wednesday, and Friday. 07/30/23   [provider]  naloxone  (NARCAN ) nasal spray 4 mg/0.1 mL Place 1 spray into the nose as needed (opioid overdose). 02/19/22   [provider]  oxyCODONE -acetaminophen  (PERCOCET) 10-325 MG tablet Take 1 tablet by mouth 4 (four) times daily as needed for pain. 03/12/22   [provider]  pantoprazole  (PROTONIX ) 40 MG tablet TAKE 1 TABLET (40 MG TOTAL) BY MOUTH 2 (TWO) TIMES DAILY. 05/13/23  Abraham Abo, MD  rosuvastatin  (CRESTOR ) 10 MG tablet TAKE 1 TABLET (10 MG TOTAL) BY MOUTH DAILY FOR CHOLESTEROL Patient taking differently: Take 10 mg by mouth in the morning. 08/07/22   Abraham Abo, MD  senna-docusate (SENOKOT-S) 8.6-50 MG tablet Take 1 tablet by mouth 2 (two) times daily between meals as needed for mild constipation. 08/14/22   Gonfa, Taye T, MD  sevelamer  carbonate (RENVELA ) 2.4 g PACK Take 2.4 g by mouth 3 (three) times daily with meals. 03/06/22   Setzer, Sandra J, PA-C  traZODone  (DESYREL ) 50 MG tablet Take 25-50 mg by mouth at bedtime as needed for sleep. 03/19/23   [provider]    Physical Exam: Vitals:   10/12/23 0400 10/12/23 0435 10/12/23 0525 10/12/23 0545  BP: (!) 235/125  (!) 223/127 (!) 235/126  Pulse:   88   Resp: (!) 29  16 16   Temp:  97.7 F (36.5 C)    TempSrc:  Oral    SpO2:   95%   Weight:      Height:        Physical Activity: Inactive (04/14/2021)   Received from [facility]   Exercise Vital Sign    Days of Exercise per Week: 0 days    Minutes of Exercise per Session: 0 min   Physical Exam Vitals reviewed.  HENT:     Head: Normocephalic and atraumatic.  Eyes:     Extraocular Movements:  Extraocular movements intact.  Cardiovascular:     Rate and Rhythm: Normal rate.  Pulmonary:     Effort: Pulmonary effort is normal. No respiratory distress.  Musculoskeletal:     Cervical back: Normal range of motion.     Right lower leg: No edema.     Left lower leg: No edema.  Skin:    General: Skin is warm.     Capillary Refill: Capillary refill takes less than 2 seconds.  Neurological:     Mental Status: She is alert.     Comments: At baseline.  RUE weakness and decreased sensation  Chronic slurred speech  No facial droop   Psychiatric:        Mood and Affect: Mood normal.        Behavior: Behavior normal.     Data Reviewed:    Labs on Admission: I have personally reviewed following labs and imaging studies  CBC: Recent Labs  Lab 10/12/23 0000 10/12/23 0006  WBC 9.3  --   NEUTROABS 7.7  --   HGB 10.2* 11.9*  HCT 31.8* 35.0*  MCV 88.3  --   PLT 165  --    Basic Metabolic Panel: Recent Labs  Lab 10/12/23 0000 10/12/23 0006  NA 138 138  K 5.0 5.2*  CL 100 102  CO2 23  --   GLUCOSE 119* 116*  BUN 59* 65*  CREATININE 10.71* 11.00*  CALCIUM  9.4  --    GFR: Estimated Creatinine Clearance: 3.4 mL/min (A) (by C-G formula based on SCr of 11 mg/dL (H)). Liver Function Tests: Recent Labs  Lab 10/12/23 0000  AST 24  ALT 16  ALKPHOS 70  BILITOT 0.9  PROT 8.0  ALBUMIN  4.1   No results for input(s): "LIPASE", "AMYLASE" in the last 168 hours. No results for input(s): "AMMONIA" in the last 168 hours. Coagulation Profile: No results for input(s): "INR", "PROTIME" in the last 168 hours. Cardiac Enzymes: No results for input(s): "CKTOTAL", "CKMB", "CKMBINDEX", "TROPONINI" in the last 168 hours. BNP (last 3 results)  No results for input(s): "PROBNP" in the last 8760 hours. HbA1C: No results for input(s): "HGBA1C" in the last 72 hours. CBG: No results for input(s): "GLUCAP" in the last 168 hours. Lipid Profile: No results for input(s): "CHOL", "HDL",  "LDLCALC", "TRIG", "CHOLHDL", "LDLDIRECT" in the last 72 hours. Thyroid  Function Tests: No results for input(s): "TSH", "T4TOTAL", "FREET4", "T3FREE", "THYROIDAB" in the last 72 hours. Anemia Panel: No results for input(s): "VITAMINB12", "FOLATE", "FERRITIN", "TIBC", "IRON", "RETICCTPCT" in the last 72 hours. Urine analysis:    Component Value Date/Time   COLORURINE AMBER (A) 12/27/2021 1840   APPEARANCEUR TURBID (A) 12/27/2021 1840   LABSPEC 1.013 12/27/2021 1840   PHURINE 8.0 12/27/2021 1840   GLUCOSEU NEGATIVE 12/27/2021 1840   GLUCOSEU NEG mg/dL 62/95/2841 3244   HGBUR MODERATE (A) 12/27/2021 1840   BILIRUBINUR NEGATIVE 12/27/2021 1840   BILIRUBINUR negative 05/04/2018 0910   KETONESUR NEGATIVE 12/27/2021 1840   PROTEINUR >=300 (A) 12/27/2021 1840   UROBILINOGEN 0.2 05/04/2018 0910   UROBILINOGEN 1 02/14/2014 0946   NITRITE NEGATIVE 12/27/2021 1840   LEUKOCYTESUR LARGE (A) 12/27/2021 1840    Radiological Exams on Admission: DG Chest Portable 1 View Result Date: 10/12/2023 CLINICAL DATA:  Shortness of breath EXAM: PORTABLE CHEST 1 VIEW COMPARISON:  09/22/2023 FINDINGS: Right-sided central venous catheter with tip at the right atrium. Post sternotomy changes. Cardiomegaly with diffuse interstitial and ground-glass opacity suspicious for pulmonary edema. This is slightly more confluent at the right base. Possible trace right-sided effusion. Aortic atherosclerosis. No pneumothorax IMPRESSION: Cardiomegaly with diffuse interstitial and ground-glass opacity suspicious for pulmonary edema. Possible trace right-sided effusion. Patchy bibasilar airspace disease right greater than left, probably due to edema though infiltrate is not excluded. Electronically Signed   By: Esmeralda Hedge M.D.   On: 10/12/2023 00:11       Assessment and Plan: No notes have been filed under this hospital service. Service: Hospitalist  73 year old female chronically uncontrolled hypertension, ESRD on  hemodialysis Tuesday Thursday Saturday admitted to observation with hypertensive urgency and hypervolemia   Dyspnea  Hypovolemia - No baseline requirement but placed on 4 L by EMS.  No documented hypoxia.  CXR with pulmonary edema.  Saturating appropriately on 4 L.   - Wean O2 as tolerated to maintain saturations greater than 90%. - Plan for dialysis this morning  ESRD on hemodialysis Tuesday Thursday Saturday -Patient reports compliance with dialysis, last session on Saturday. - Given Lokelma  in the ED for hyperkalemia. - Plan to dialyze this morning - Nephrology assistance appreciated  Treat resistant hypertension - Blood pressure significantly elevated without evidence of endorgan damage - Refractory to multiple antihypertensives given in the ED.  Historically blood pressure only improves with dialysis - home medications resumed  Atrial fibrillation - Continue beta-blocker and Eliquis   No IVF  2g sodium restricted diet  Monitor/replace electrolytes  Eliquis    Advance Care Planning:   Code Status: Full Code per conversation with patient in ED   Consults: Nephrology      Severity of Illness: The appropriate patient status for this patient is OBSERVATION. Observation status is judged to be reasonable and necessary in order to provide the required intensity of service to ensure the patient's safety. The patient's presenting symptoms, physical exam findings, and initial radiographic and laboratory data in the context of their medical condition is felt to place them at decreased risk for further clinical deterioration. Furthermore, it is anticipated that the patient will be medically stable for discharge from the hospital within 2 midnights of  admission.   Author: Charlesetta Connors, DO 10/12/2023 6:27 AM  For on call review [email or web address].

## 2023-10-12 NOTE — Progress Notes (Signed)
   10/12/23 1501  TOC Brief Assessment  Insurance and Status Reviewed  Patient has primary care physician Yes  Home environment has been reviewed significant other  Prior level of function: independent has cane wakler  Prior/Current Home Services No current home services  Social Drivers of Health Review SDOH reviewed no interventions necessary  Readmission risk has been reviewed Yes  Transition of care needs no transition of care needs at this time   Patient from home with significant other. Has walker cane. Patient currently on oxygen  in hospital but not at home. If needed at home will need MD order and ambulation oxygenation saturation note

## 2023-10-12 NOTE — Consult Note (Addendum)
 Renal Service Consult Note [facility]  Monique Henderson 10/12/2023 Lynae Sandifer, MD Requesting Physician: Dr. Lyndon Santiago  Reason for Consult: ESRD patient with HPI: The patient is a 73 y.o. year-old w/ PMH as below who presented shortness of breath and dry cough.  Last dialysis was Saturday.  In the ED blood pressure was 255/141, she was placed on 4 L nasal cannula oxygen .  She was given different multiple antihypertensives in the ED without improvement in blood pressure.  Patient was admitted and we are asked to see for dialysis  Pt seen in HD unit.  Her breathing is better.  She denies any chest pain or leg swelling.  ROS - denies CP, no joint pain, no HA, no blurry vision, no rash, no diarrhea, no nausea/ vomiting  PMH: History of cardioembolic stroke Paroxysmal atrial fibrillation History of aortic dissection History of breast cancer Chronic HFrEF COPD ESRD on HD Hypertension History of hepatitis C Tobacco use disorder  Past Surgical History  Past Surgical History:  Procedure Laterality Date   ANTERIOR LAT LUMBAR FUSION N/A 01/18/2014   Procedure: ANTERIOR LATERAL LUMBAR FUSION LUMBAR TWO-THREE;  Surgeon: Isadora Mar, MD;  Location: [facility];  Service: Neurosurgery;  Laterality: N/A;   ANTERIOR LUMBAR FUSION  01/18/2014   AV FISTULA PLACEMENT Left 04/20/2019   Procedure: ARTERIOVENOUS (AV) FISTULA CREATION;  Surgeon: Adine Hoof, MD;  Location: [facility] OR;  Service: Vascular;  Laterality: Left;   BACK SURGERY     BREAST LUMPECTOMY Left 01/2003   BREAST LUMPECTOMY Right 2014   BREAST LUMPECTOMY WITH NEEDLE LOCALIZATION AND AXILLARY SENTINEL LYMPH NODE BX Right 10/13/2012   Procedure: BREAST LUMPECTOMY WITH NEEDLE LOCALIZATION;  Surgeon: Mayme Spearman, MD;  Location: [facility];  Service: General;  Laterality: Right;  Right breast wire localized lumpectomy   DIALYSIS/PERMA CATHETER INSERTION N/A 05/28/2023   Procedure:  DIALYSIS/PERMA CATHETER INSERTION;  Surgeon: Patrick Boor, MD;  Location: [facility];  Service: Cardiovascular;  Laterality: N/A;   INSERTION OF DIALYSIS CATHETER Right 04/20/2019   Procedure: INSERTION OF DIALYSIS CATHETER, right internal jugular;  Surgeon: Adine Hoof, MD;  Location: [facility] OR;  Service: Vascular;  Laterality: Right;   INSERTION OF DIALYSIS CATHETER Right 09/24/2020   Procedure: INSERTION OF TUNNELED DIALYSIS CATHETER;  Surgeon: Mayo Speck, MD;  Location: [facility] OR;  Service: Vascular;  Laterality: Right;   IR FLUORO GUIDE CV LINE RIGHT  09/22/2020   IR THORACENTESIS ASP PLEURAL SPACE W/IMG GUIDE  05/19/2019   IR US  GUIDE VASC ACCESS LEFT  09/22/2020   IR US  GUIDE VASC ACCESS RIGHT  09/22/2020   IR VENOCAVAGRAM SVC  09/22/2020   LAMINECTOMY  05/27/2009   Lumbar decompressive laminectomy, fusion and plating for lumbar spinal stensosis   LIGATION OF ARTERIOVENOUS  FISTULA Left 09/24/2020   Procedure: LIGATION OF LEFT ARM ARTERIOVENOUS  FISTULA;  Surgeon: Mayo Speck, MD;  Location: [facility] OR;  Service: Vascular;  Laterality: Left;   LUMBAR LAMINECTOMY/DECOMPRESSION MICRODISCECTOMY Left 03/23/2013   Procedure: LUMBAR LAMINECTOMY/DECOMPRESSION MICRODISCECTOMY 1 LEVEL;  Surgeon: Isadora Mar, MD;  Location: [facility];  Service: Neurosurgery;  Laterality: Left;  LUMBAR LAMINECTOMY/DECOMPRESSION MICRODISCECTOMY 1 LEVEL   MASTECTOMY, PARTIAL Left 02/26/2003   ; S/P re-excision of cranial and lateral margins 04/19/2003.    RE-EXCISION OF BREAST CANCER,SUPERIOR MARGINS Right 10/27/2012   Procedure: RE-EXCISION OF BREAST CANCER,SUPERIOR and inferior MARGINS;  Surgeon: Mayme Spearman, MD;  Location: [facility] OR;  Service: General;  Laterality: Right;   RE-EXCISION OF BREAST LUMPECTOMY Left 04/2003   TEE WITHOUT CARDIOVERSION N/A 04/04/2019   Procedure: Transesophageal Echocardiogram (Tee);  Surgeon: Rudine Cos, MD;  Location: [facility] OR;  Service: Open Heart Surgery;  Laterality:  N/A;   THORACIC AORTIC ANEURYSM REPAIR N/A 04/04/2019   Procedure: THORACIC ASCENDING ANEURYSM REPAIR (AAA)  USING 28 MM X 30 CM HEMASHIELD PLATINUM VASCULAR GRAFT;  Surgeon: Rudine Cos, MD;  Location: [facility] OR;  Service: Open Heart Surgery;  Laterality: N/A;   Family History  Family History  Problem Relation Age of Onset   Colon cancer Mother 70   Hypertension Mother    Diabetes Sister 43   Hypertension Sister    Diabetes Brother    Hypertension Brother    Diabetes Brother    Hypertension Brother    Kidney disease Son        s/p renal transplant   Hypertension Son    Diabetes Son    Multiple sclerosis Son    Bone cancer Sister 45   Breast cancer Neg Hx    Cervical cancer Neg Hx    Social History  reports that she quit smoking about 3 years ago. Her smoking use included cigarettes. She started smoking about 47 years ago. She has a 11 pack-year smoking history. She has never used smokeless tobacco. She reports that she does not currently use alcohol  after a past usage of about 2.0 standard drinks of alcohol  per week. She reports that she does not currently use drugs after having used the following drugs: Cocaine . Allergies  Allergies  Allergen Reactions   Shrimp [Shellfish Allergy] Shortness Of Breath   Bactroban  [Mupirocin ] Other (See Comments)    "Sores in nose"   Hydrocodone  Itching, Nausea And Vomiting and Nausea Only   Chlorhexidine  Gluconate Itching   Latex Itching    Latex gloves cause itchiness and a rash   Tylenol  [Acetaminophen ] Itching    Takes Percocet at home 05/06/23   Zestril  [Lisinopril ] Cough   Home medications Prior to Admission medications   Medication Sig Start Date End Date Taking? Authorizing Provider  acetaminophen  (TYLENOL ) 325 MG tablet Take 650 mg by mouth every 6 (six) hours as needed for mild pain (pain score 1-3) or moderate pain (pain score 4-6).    [provider]  albuterol  (PROVENTIL ) (2.5 MG/3ML) 0.083% nebulizer solution Take 3  mLs (2.5 mg total) by nebulization every 6 (six) hours as needed for wheezing or shortness of breath. 08/23/23   Pokhrel, Amador Bad, MD  albuterol  (VENTOLIN  HFA) 108 (90 Base) MCG/ACT inhaler Inhale 2 puffs into the lungs every 6 (six) hours as needed. 10/02/23   [provider]  amLODipine  (NORVASC ) 10 MG tablet Take 1 tablet (10 mg total) by mouth daily. 03/06/22   Setzer, Sandra J, PA-C  apixaban  (ELIQUIS ) 2.5 MG TABS tablet Take 1 tablet (2.5 mg total) by mouth 2 (two) times daily. 03/06/22   Setzer, Sandra J, PA-C  carvedilol  (COREG ) 25 MG tablet Take 1 tablet (25 mg total) by mouth 2 (two) times daily with a meal. 05/01/22 06/16/24  Ghimire, Estil Heman, MD  Cholecalciferol  (VITAMIN D -3) 125 MCG (5000 UT) TABS Take 1 tablet by mouth in the morning.    [provider]  cinacalcet  (SENSIPAR ) 60 MG tablet Take 60 mg by mouth every dialysis (Tuesday, Thursday, and Saturday).    [provider]  cloNIDine  (CATAPRES ) 0.1 MG tablet Take 1 tablet (0.1 mg total) by mouth 2 (two)  times daily. Do not take it before dialysis 09/02/23   Deforest Fast, MD  hydrALAZINE  (APRESOLINE ) 100 MG tablet Take 1 tablet (100 mg total) by mouth every 8 (eight) hours. Patient taking differently: Take 100 mg by mouth 2 (two) times daily. 12/18/22   Lucendia Rusk, MD  isosorbide  mononitrate (IMDUR ) 30 MG 24 hr tablet TAKE 1 TABLET (30MG ) ON DIALYSIS DAYS AND 2 TABLETS (60 MG) ON NON DIALYSIS DAYS. 01/12/23   Lucendia Rusk, MD  LOKELMA  10 g PACK packet Take 10 g by mouth every Monday, Wednesday, and Friday. 07/30/23   [provider]  naloxone  (NARCAN ) nasal spray 4 mg/0.1 mL Place 1 spray into the nose as needed (opioid overdose). 02/19/22   [provider]  oxyCODONE -acetaminophen  (PERCOCET) 10-325 MG tablet Take 1 tablet by mouth 4 (four) times daily as needed for pain. 03/12/22   [provider]  pantoprazole  (PROTONIX ) 40 MG tablet TAKE 1 TABLET (40 MG TOTAL) BY MOUTH 2  (TWO) TIMES DAILY. 05/13/23   Abraham Abo, MD  rosuvastatin  (CRESTOR ) 10 MG tablet TAKE 1 TABLET (10 MG TOTAL) BY MOUTH DAILY FOR CHOLESTEROL 08/07/22   Abraham Abo, MD  senna-docusate (SENOKOT-S) 8.6-50 MG tablet Take 1 tablet by mouth 2 (two) times daily between meals as needed for mild constipation. 08/14/22   Gonfa, Taye T, MD  sevelamer  carbonate (RENVELA ) 2.4 g PACK Take 2.4 g by mouth 3 (three) times daily with meals. 03/06/22   Setzer, Sandra J, PA-C  traZODone  (DESYREL ) 50 MG tablet Take 25-50 mg by mouth at bedtime as needed for sleep. 03/19/23   [provider]     Vitals:   10/12/23 1030 10/12/23 1100 10/12/23 1130 10/12/23 1159  BP: (!) 199/98 (!) 193/97 (!) 195/103 (!) 214/99  Pulse: 74 80 76 91  Resp: (!) 21 (!) 21 18 (!) 27  Temp:      TempSrc:      SpO2: 95% 94% 96% 96%  Weight:      Height:       Exam Gen alert, no distress, [place] O2 No rash, cyanosis or gangrene Sclera anicteric, throat clear  No jvd or bruits Chest clear bilat to bases, no rales/ wheezing RRR no MRG Abd soft ntnd no mass or ascites +bs GU defer MS no joint effusions or deformity Ext no LE or UE edema, no other edema Neuro is alert, Ox 3 , nf    RIJ TDC intact      Renal-related home meds: Norvasc  10 daily Coreg  25 twice daily Catapres  0.1 mg twice daily  Hydralazine  100 mg 3 times daily Lokelma  10 g MWF  Renvela  3 AC 3 times daily     OP HD:  [facility] TTS  Needs updating-> 3.5h  B400  46kg   2k bath RIJ TDC  Heparin  none   CXR 5/13 - CM w/ diffuse IS edema   Assessment/ Plan: SOB/ hypervolemia: CXR w/ pulm edema, on [place] O2 support. On HD this w/ max UF goal 3 L.  ESRD: on HD TTS. HD today on schedule.  HTN: uncontrolled HTN in ED. 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, follow Secondary hyperparathyroidism: CCa in range, cont binders ac   Larry Poag  MD [facility] 10/12/2023, 12:18 PM  Recent Labs  Lab 10/12/23 0000 10/12/23 0006  HGB  10.2* 11.9*  ALBUMIN  4.1  --   CALCIUM  9.4  --   CREATININE 10.71* 11.00*  K 5.0  5.2*   Inpatient medications:  amLODipine   10 mg Oral q AM   apixaban   2.5 mg Oral BID   carvedilol   25 mg Oral BID WC   cholecalciferol   1,000 Units Oral Daily   cloNIDine   0.1 mg Oral QHS   [START ON 10/13/2023] cloNIDine   0.1 mg Oral Once per day on Sunday Monday Wednesday Friday   heparin   2,000 Units Dialysis Once in dialysis   hydrALAZINE   100 mg Oral Q8H   [START ON 10/14/2023] isosorbide  mononitrate  30 mg Oral Q T,Th,Sa-HD   [START ON 10/13/2023] isosorbide  mononitrate  60 mg Oral Once per day on Sunday Monday Wednesday Friday   pantoprazole   40 mg Oral BID   rosuvastatin   10 mg Oral q AM   sevelamer  carbonate  2.4 g Oral TID WC   [START ON 10/13/2023] sodium zirconium cyclosilicate   10 g Oral Q M,W,F    anticoagulant sodium citrate      acetaminophen  **OR** acetaminophen , albuterol , alteplase , anticoagulant sodium citrate , cinacalcet , heparin , hydrALAZINE , lidocaine  (PF), lidocaine -prilocaine , oxyCODONE -acetaminophen  **AND** oxyCODONE , pentafluoroprop-tetrafluoroeth, senna-docusate, traZODone 

## 2023-10-12 NOTE — Progress Notes (Signed)
 Pt. Refused bipap at this time. MD & RN made aware.

## 2023-10-12 NOTE — Procedures (Signed)
 I was present at the procedure, reviewed the HD regimen and made appropriate changes.   Larry Poag MD  [facility] 10/12/2023, 12:36 PM

## 2023-10-12 NOTE — Progress Notes (Signed)
 Pt receives out-pt HD at [facility] [place] on TTS 12:05 pm chair time. Will assist as needed.   Lauraine Polite Renal Navigator [phone]

## 2023-10-12 NOTE — ED Notes (Signed)
 Pt refusing BIPAP. MD notified

## 2023-10-12 NOTE — Progress Notes (Addendum)
 Patient admitted earlier this morning.  H&P reviewed.  Patient seen and examined.  Patient denies any headache chest pain.  Did come in with shortness of breath.  She mentioned that she did go for her dialysis on Saturday and completed the treatment.  She has a previous history of noncompliance though.  Vital signs reviewed. Lungs reveal few crackles at the bases bilaterally.  No wheezing or rhonchi. S1-S2 is normal regular. Abdomen is soft.  Nontender nondistended. No significant pedal edema noted.  Noted to have significantly elevated blood pressure which is also common when patient needs to undergo dialysis.  She has resistant hypertension.  She has been started back on all of her antihypertensives.  Will use hydralazine  as needed.  Hopefully blood pressure will improve with dialysis today.  Previously she has required nitroglycerin  infusion. Chest x-ray shows pulmonary edema. Nephrology aware of this patient.  Other medical issues as per H&P.  ADDENDUM Called by nursing staff this evening that patient was complaining of chest pain.  Patient was given oxycodone .  Patient subsequently went up to sleep.  EKG shows a T wave inversions in lateral leads.  She has had some T wave inversions in previous EKGs as well.  Patient noted to be stable hemodynamically.  Blood pressure improved after hemodialysis.  Previously she was greater than 200 systolic blood pressure.  Troponin levels were noted to be 70s earlier today.  Patient not in any distress per nursing staff.  Will recheck troponin level.  If there is significant elevation then we will need to involve cardiology.  Geneieve Duell 10/12/2023

## 2023-10-12 NOTE — Care Management Obs Status (Signed)
 MEDICARE OBSERVATION STATUS NOTIFICATION   Patient Details  Name: Monique Henderson MRN: [id number] Date of Birth: 14-Apr-1951   Medicare Observation Status Notification Given:  Yes    Terre Ferri, RN 10/12/2023, 3:00 PM

## 2023-10-12 NOTE — Plan of Care (Signed)
  Problem: Pain Managment: Goal: General experience of comfort will improve and/or be controlled Outcome: Progressing   Problem: Safety: Goal: Ability to remain free from injury will improve Outcome: Progressing

## 2023-10-12 NOTE — Plan of Care (Signed)
  Problem: Clinical Measurements: Goal: Ability to maintain clinical measurements within normal limits will improve Outcome: Progressing   Problem: Activity: Goal: Risk for activity intolerance will decrease Outcome: Progressing   Problem: Nutrition: Goal: Adequate nutrition will be maintained Outcome: Progressing   Problem: Coping: Goal: Level of anxiety will decrease Outcome: Progressing   Problem: Pain Managment: Goal: General experience of comfort will improve and/or be controlled Outcome: Progressing   Problem: Safety: Goal: Ability to remain free from injury will improve Outcome: Progressing

## 2023-10-13 ENCOUNTER — Other Ambulatory Visit: Payer: Self-pay | Admitting: Interventional Cardiology

## 2023-10-13 ENCOUNTER — Other Ambulatory Visit: Payer: Self-pay | Admitting: Family Medicine

## 2023-10-13 DIAGNOSIS — E8770 Fluid overload, unspecified: Secondary | ICD-10-CM | POA: Diagnosis not present

## 2023-10-13 DIAGNOSIS — N186 End stage renal disease: Secondary | ICD-10-CM | POA: Diagnosis not present

## 2023-10-13 DIAGNOSIS — J9601 Acute respiratory failure with hypoxia: Secondary | ICD-10-CM | POA: Diagnosis not present

## 2023-10-13 DIAGNOSIS — I10 Essential (primary) hypertension: Secondary | ICD-10-CM | POA: Diagnosis not present

## 2023-10-13 LAB — BASIC METABOLIC PANEL WITH GFR
Anion gap: 15 (ref 5–15)
BUN: 38 mg/dL — ABNORMAL HIGH (ref 8–23)
CO2: 24 mmol/L (ref 22–32)
Calcium: 9.1 mg/dL (ref 8.9–10.3)
Chloride: 97 mmol/L — ABNORMAL LOW (ref 98–111)
Creatinine, Ser: 7.87 mg/dL — ABNORMAL HIGH (ref 0.44–1.00)
GFR, Estimated: 5 mL/min — ABNORMAL LOW (ref >60)
Glucose, Bld: 91 mg/dL (ref 70–99)
Potassium: 5 mmol/L (ref 3.5–5.1)
Sodium: 136 mmol/L (ref 135–145)

## 2023-10-13 LAB — HEPATIC FUNCTION PANEL
ALT: 14 U/L (ref 0–44)
AST: 20 U/L (ref 15–41)
Albumin: 3.3 g/dL — ABNORMAL LOW (ref 3.5–5.0)
Alkaline Phosphatase: 55 U/L (ref 38–126)
Bilirubin, Direct: 0.2 mg/dL (ref 0.0–0.2)
Indirect Bilirubin: 0.4 mg/dL (ref 0.3–0.9)
Total Bilirubin: 0.6 mg/dL (ref 0.0–1.2)
Total Protein: 6.8 g/dL (ref 6.5–8.1)

## 2023-10-13 LAB — CBC
HCT: 30.1 % — ABNORMAL LOW (ref 36.0–46.0)
Hemoglobin: 9.4 g/dL — ABNORMAL LOW (ref 12.0–15.0)
MCH: 28.1 pg (ref 26.0–34.0)
MCHC: 31.2 g/dL (ref 30.0–36.0)
MCV: 90.1 fL (ref 80.0–100.0)
Platelets: 145 10*3/uL — ABNORMAL LOW (ref 150–400)
RBC: 3.34 MIL/uL — ABNORMAL LOW (ref 3.87–5.11)
RDW: 17.7 % — ABNORMAL HIGH (ref 11.5–15.5)
WBC: 6 10*3/uL (ref 4.0–10.5)
nRBC: 0 % (ref 0.0–0.2)

## 2023-10-13 LAB — HEPATITIS B SURFACE ANTIBODY, QUANTITATIVE: Hep B S AB Quant (Post): 37.8 m[IU]/mL

## 2023-10-13 LAB — MAGNESIUM: Magnesium: 2.2 mg/dL (ref 1.7–2.4)

## 2023-10-13 LAB — PHOSPHORUS: Phosphorus: 5.7 mg/dL — ABNORMAL HIGH (ref 2.5–4.6)

## 2023-10-13 NOTE — Discharge Planning (Signed)
 [facility] Patient Discharge Orders - [facility] CLINIC: [facility]  Patient's name: Monique Henderson Admit/DC Dates: 10/11/2023 - 10/13/2023  DISCHARGE DIAGNOSES: Acute shortness of breath- chest xray consistent with pulm edema- pt refused bipap Chest pain- resolved  ESRD on HD TTS  HD ORDER CHANGES: Heparin  change: no EDW Change: yes New EDW: 45.6 kg Bath Change: no change  ANEMIA MANAGEMENT: Aranesp : Given: no    ESA dose for discharge: mircera per protocol IV Iron dose at discharge: n/a Transfusion: Given: n/a  BONE/MINERAL MEDICATIONS: Hectorol /Calcitriol change: per protocol Sensipar /Parsabiv change: per protocol  ACCESS INTERVENTION/CHANGE: no  Details: [facility]   RECENT LABS: Recent Labs  Lab 10/13/23 0718  HGB 9.4*  NA 136  K 5.0  CALCIUM  9.1  PHOS 5.7*  ALBUMIN  3.3*    IV ANTIBIOTICS: n/a Details:  OTHER ANTICOAGULATION: On Coumadin ?: n/a Last INR: Managed By:  OTHER/APPTS/LAB ORDERS: Please update labs at HD   D/C Meds to be reconciled by nurse after every discharge.  Completed By: Hersey Lorenzo, NP-C   Reviewed by: MD:______ RN_______

## 2023-10-13 NOTE — Discharge Summary (Signed)
 Physician Discharge Summary   Patient: Monique Henderson MRN: [id number] DOB: [birth year]  Admit date:     10/11/2023  Discharge date: 10/13/23  Discharge Physician: Aura Leeds, DO    PCP: Charle Congo, MD   Recommendations at discharge:    F/U with PCP w/in 1-2 weeks and repeat CBC, CMP, Mag, Phos w/in 1 week  Discharge Diagnoses: Principal Problem:   Fluid overload Active Problems:   Acute hypoxemic respiratory failure (HCC)   Essential hypertension   ESRD (end stage renal disease) (HCC)   Acute hypoxic respiratory failure (HCC)  Resolved Problems:   * No resolved hospital problems. [facility] Course: HPI per Dr. Senora Dame:  Monique Henderson is a 72 y.o. 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  She reports her last dialysis session was Saturday.  In the emergency department, she was hypertensive up to 255/141.  Patient denies headache, new focal neurologic deficit or vision change, abdominal pain, bloody stools, hematuria, chest pain.  She was placed on 4 L nasal cannula, no desaturations noted. She was given multiple antihypertensives in the emergency department without any improvement in blood pressure. ED provider discussed with on-call nephrologist, plan for dialysis in the morning.   **Interim History: Nephrology was consulted and she was dialyzed and subsequently left AMA prior to being seen this morning.  Assessment and Plan: Dyspnea  Hypervolemia - No baseline requirement but placed on 4 L by EMS.  No documented hypoxia.  CXR with pulmonary edema.  Saturating appropriately on 4 L.   - Wean O2 as tolerated to maintain saturations greater than 90%. - dialzyed and left AMA   ESRD on hemodialysis  Tuesday Thursday Saturday -Patient reports compliance with dialysis, last session on Saturday. - Given Lokelma  in the ED for hyperkalemia. -Dialyzed and left AMA - Nephrology assistance appreciated   Treat resistant hypertension - Blood pressure significantly elevated without evidence of endorgan damage - Refractory to multiple antihypertensives given in the ED.  Historically blood pressure only improves with dialysis - home medications resumed   Atrial fibrillation - Continue beta-blocker and Eliquis     Consultants: Nephrology Procedures performed: As delineated as above  Disposition: LEFT AGAINST MEDICAL ADVICE Prior to being Seen  Diet recommendation:  Renal diet  DISCHARGE MEDICATION: Allergies as of 10/13/2023       Reactions   Shrimp [shellfish Allergy] Shortness Of Breath   Bactroban  [mupirocin ] Other (See Comments)   "Sores in nose"   Hydrocodone  Itching, Nausea And Vomiting, Nausea Only   Chlorhexidine  Gluconate Itching   Latex Itching   Latex gloves cause itchiness and a rash   Tylenol  [acetaminophen ] Itching   Takes Percocet at home 05/06/23   Zestril  [lisinopril ] Cough        Medication List     TAKE these medications    acetaminophen  325 MG tablet Commonly known as: TYLENOL  Take 650 mg by mouth every 6 (six) hours as needed for mild pain (pain score 1-3) or moderate pain (pain score 4-6).   albuterol  (2.5 MG/3ML) 0.083% nebulizer solution Commonly known as: PROVENTIL  Take 3 mLs (2.5 mg total) by nebulization every 6 (six) hours as needed for wheezing or shortness of breath.   albuterol  108 (90 Base) MCG/ACT inhaler Commonly known as: VENTOLIN  HFA Inhale 2 puffs into the  lungs every 6 (six) hours as needed.   amLODipine  10 MG tablet Commonly known as: NORVASC  Take 1 tablet (10 mg total) by mouth daily. What changed: when to take this   apixaban  2.5 MG Tabs tablet Commonly known as: ELIQUIS  Take 1 tablet (2.5 mg total) by mouth 2 (two) times  daily. What changed: additional instructions   carvedilol  25 MG tablet Commonly known as: COREG  Take 1 tablet (25 mg total) by mouth 2 (two) times daily with a meal. What changed: additional instructions   cinacalcet  60 MG tablet Commonly known as: SENSIPAR  Take 60 mg by mouth every dialysis (Tuesday, Thursday, and Saturday).   cloNIDine  0.1 MG tablet Commonly known as: Catapres  Take 1 tablet (0.1 mg total) by mouth 2 (two) times daily. Do not take it before dialysis What changed: additional instructions   hydrALAZINE  100 MG tablet Commonly known as: APRESOLINE  Take 1 tablet (100 mg total) by mouth every 8 (eight) hours. What changed:  when to take this additional instructions   isosorbide  mononitrate 30 MG 24 hr tablet Commonly known as: IMDUR  TAKE 1 TABLET (30MG ) ON DIALYSIS DAYS AND 2 TABLETS (60 MG) ON NON DIALYSIS DAYS.   Lokelma  10 g Pack packet Generic drug: sodium zirconium cyclosilicate  Take 10 g by mouth every Monday, Wednesday, and Friday.   naloxone  4 MG/0.1ML Liqd nasal spray kit Commonly known as: NARCAN  Place 1 spray into the nose as needed (opioid overdose).   pantoprazole  40 MG tablet Commonly known as: PROTONIX  TAKE 1 TABLET (40 MG TOTAL) BY MOUTH 2 (TWO) TIMES DAILY. What changed: additional instructions   rosuvastatin  10 MG tablet Commonly known as: CRESTOR  TAKE 1 TABLET (10 MG TOTAL) BY MOUTH DAILY FOR CHOLESTEROL What changed: See the new instructions.   senna-docusate 8.6-50 MG tablet Commonly known as: Senokot-S Take 1 tablet by mouth 2 (two) times daily between meals as needed for mild constipation.   traZODone  50 MG tablet Commonly known as: DESYREL  Take 25-50 mg by mouth at bedtime as needed for sleep.   Vitamin D -3 125 MCG (5000 UT) Tabs Take 1 tablet by mouth in the morning.        Discharge Exam: Filed Weights   10/11/23 2346 10/12/23 0950 10/12/23 1330  Weight: 46 kg 47.2 kg 45.6 kg   Vitals:   10/13/23 0818 10/13/23  0820  BP: (!) 156/81   Pulse: 80 89  Resp: 17   Temp: 97.7 F (36.5 C)   SpO2: (!) 88% 91%   NO PHYSICAL EXAM as PATIENT LEFT AMA PRIOR to being SEEN  Condition at discharge: Guarded  The results of significant diagnostics from this hospitalization (including imaging, microbiology, ancillary and laboratory) are listed below for reference.   Imaging Studies: DG CHEST PORT 1 VIEW Result Date: 10/12/2023 CLINICAL DATA:  Chest pain and shortness breath EXAM: PORTABLE CHEST 1 VIEW COMPARISON:  10/12/2023 FINDINGS: Right IJ dialysis catheter tip: Right atrium. Prior median sternotomy. Moderate enlargement of the cardiopericardial silhouette with indistinct pulmonary vasculature and hazy interstitial accentuation raising the possibility of interstitial edema or less likely atypical pneumonia. Stable hazy airspace opacity at the right lung base potentially from confluent edema or pneumonia. No blunting of the costophrenic angles. IMPRESSION: 1. Moderate enlargement of the cardiopericardial silhouette with indistinct pulmonary vasculature and hazy interstitial accentuation raising the possibility of interstitial edema or less likely atypical pneumonia. 2. Stable hazy airspace opacity at the right lung base potentially from confluent edema or pneumonia. 3. Right IJ dialysis catheter tip: Right atrium.  Electronically Signed   By: Freida Jes M.D.   On: 10/12/2023 19:03   DG Chest Portable 1 View Result Date: 10/12/2023 CLINICAL DATA:  Shortness of breath EXAM: PORTABLE CHEST 1 VIEW COMPARISON:  09/22/2023 FINDINGS: Right-sided central venous catheter with tip at the right atrium. Post sternotomy changes. Cardiomegaly with diffuse interstitial and ground-glass opacity suspicious for pulmonary edema. This is slightly more confluent at the right base. Possible trace right-sided effusion. Aortic atherosclerosis. No pneumothorax IMPRESSION: Cardiomegaly with diffuse interstitial and ground-glass opacity  suspicious for pulmonary edema. Possible trace right-sided effusion. Patchy bibasilar airspace disease right greater than left, probably due to edema though infiltrate is not excluded. Electronically Signed   By: Esmeralda Hedge M.D.   On: 10/12/2023 00:11   DG Chest 2 View Result Date: 09/22/2023 CLINICAL DATA:  Shortness of breath and increased heart rate. EXAM: CHEST - 2 VIEW COMPARISON:  September 21, 2023 FINDINGS: There is stable right-sided venous catheter positioning. Multiple sternal wires are noted. The cardiac silhouette is enlarged and unchanged in size. Calcification of the aortic arch is present. Mild, stable right basilar atelectasis and/or infiltrate is seen. No pleural effusion or pneumothorax is identified. The visualized skeletal structures are unremarkable. IMPRESSION: 1. Stable cardiomegaly and evidence of prior median sternotomy. 2. Mild, stable right basilar atelectasis and/or infiltrate. Follow-up to resolution is recommended. Electronically Signed   By: Virgle Grime M.D.   On: 09/22/2023 03:55   DG Chest Port 1 View Result Date: 09/21/2023 CLINICAL DATA:  Shortness of breath. EXAM: PORTABLE CHEST 1 VIEW COMPARISON:  August 31, 2023 FINDINGS: Multiple sternal wires are noted. The cardiac silhouette is enlarged and unchanged in size. There is mild to moderate severity calcification of the aortic arch. Mild right basilar atelectasis and/or infiltrate is seen. This is very mildly increased in severity when compared to the prior study. No pleural effusion or pneumothorax is identified. The visualized skeletal structures are unremarkable. IMPRESSION: 1. Stable cardiomegaly with evidence of prior median sternotomy. 2. Mild right basilar atelectasis and/or infiltrate. Electronically Signed   By: Virgle Grime M.D.   On: 09/21/2023 19:25   Microbiology: Results for orders placed or performed during the hospital encounter of 09/21/23  Resp panel by RT-PCR (RSV, Flu A&B, Covid) Anterior  Nasal Swab     Status: None   Collection Time: 09/22/23  3:10 AM   Specimen: Anterior Nasal Swab  Result Value Ref Range Status   SARS Coronavirus 2 by RT PCR NEGATIVE NEGATIVE Final   Influenza A by PCR NEGATIVE NEGATIVE Final   Influenza B by PCR NEGATIVE NEGATIVE Final    Comment: (NOTE) The Xpert Xpress SARS-CoV-2/FLU/RSV plus assay is intended as an aid in the diagnosis of influenza from Nasopharyngeal swab specimens and should not be used as a sole basis for treatment. Nasal washings and aspirates are unacceptable for Xpert Xpress SARS-CoV-2/FLU/RSV testing.  Fact Sheet for Patients: BloggerCourse.com  Fact Sheet for Healthcare Providers: SeriousBroker.it  This test is not yet approved or cleared by the [place]  FDA and has been authorized for detection and/or diagnosis of SARS-CoV-2 by FDA under an Emergency Use Authorization (EUA). This EUA will remain in effect (meaning this test can be used) for the duration of the COVID-19 declaration under Section 564(b)(1) of the Act, 21 U.S.C. section 360bbb-3(b)(1), unless the authorization is terminated or revoked.     Resp Syncytial Virus by PCR NEGATIVE NEGATIVE Final    Comment: (NOTE) Fact Sheet for Patients: BloggerCourse.com  Fact Sheet for Healthcare  Providers: SeriousBroker.it  This test is not yet approved or cleared by the [place]  FDA and has been authorized for detection and/or diagnosis of SARS-CoV-2 by FDA under an Emergency Use Authorization (EUA). This EUA will remain in effect (meaning this test can be used) for the duration of the COVID-19 declaration under Section 564(b)(1) of the Act, 21 U.S.C. section 360bbb-3(b)(1), unless the authorization is terminated or revoked.  Performed at [facility], [address] [place], [place] [address]    *Note: Due to a large number of results and/or  encounters for the requested time period, some results have not been displayed. A complete set of results can be found in Results Review.   Labs: CBC: Recent Labs  Lab 10/12/23 0000 10/12/23 0006 10/13/23 0718  WBC 9.3  --  6.0  NEUTROABS 7.7  --   --   HGB 10.2* 11.9* 9.4*  HCT 31.8* 35.0* 30.1*  MCV 88.3  --  90.1  PLT 165  --  145*   Basic Metabolic Panel: Recent Labs  Lab 10/12/23 0000 10/12/23 0006 10/13/23 0718  NA 138 138 136  K 5.0 5.2* 5.0  CL 100 102 97*  CO2 23  --  24  GLUCOSE 119* 116* 91  BUN 59* 65* 38*  CREATININE 10.71* 11.00* 7.87*  CALCIUM  9.4  --  9.1  MG  --   --  2.2  PHOS  --   --  5.7*   Liver Function Tests: Recent Labs  Lab 10/12/23 0000 10/13/23 0718  AST 24 20  ALT 16 14  ALKPHOS 70 55  BILITOT 0.9 0.6  PROT 8.0 6.8  ALBUMIN  4.1 3.3*   CBG: No results for input(s): "GLUCAP" in the last 168 hours.  Discharge time spent: less than 30 minutes.  Signed: Aura Leeds, DO [facility] 10/15/2023

## 2023-10-13 NOTE — Hospital Course (Signed)
         Assessment and Plan:    Dyspnea  Hypervolemia - No baseline requirement but placed on 4 L by EMS.  No documented hypoxia.  CXR with pulmonary edema.  Saturating appropriately on 4 L.   - Wean O2 as tolerated to maintain saturations greater than 90%. - Plan for dialysis this morning   ESRD on hemodialysis Tuesday Thursday Saturday -Patient reports compliance with dialysis, last session on Saturday. - Given Lokelma  in the ED for hyperkalemia. - Plan to dialyze this morning - Nephrology assistance appreciated   Treat resistant hypertension - Blood pressure significantly elevated without evidence of endorgan damage - Refractory to multiple antihypertensives given in the ED.  Historically blood pressure only improves with dialysis - home medications resumed   Atrial fibrillation - Continue beta-blocker and Eliquis    No IVF  2g sodium restricted diet  Monitor/replace electrolytes  Eliquis

## 2023-10-13 NOTE — Progress Notes (Signed)
 Heart Failure Navigator Progress Note  Assessed for Heart & Vascular TOC clinic readiness.  Patient does not meet criteria due to ESRD on hemodialysis.   Navigator will sign off at this time.   Rhae Hammock, BSN, Scientist, clinical (histocompatibility and immunogenetics) Only

## 2023-10-13 NOTE — Progress Notes (Signed)
 Pt's d/c noted. Contacted [facility] to be advised of pt's d/c today and that pt should resume care tomorrow.   Olivia Canter Renal Navigator [phone]

## 2023-10-13 NOTE — Progress Notes (Signed)
 Patient is allergic to CHG.

## 2023-10-13 NOTE — Progress Notes (Incomplete)
 PROGRESS NOTE    Monique Henderson  BMW:[id number] DOB: [birth year] DOA: 10/11/2023 PCP: Charle Congo, MD   Brief Narrative:          Assessment and Plan:    Dyspnea  Hypervolemia - No baseline requirement but placed on 4 L by EMS.  No documented hypoxia.  CXR with pulmonary edema.  Saturating appropriately on 4 L.   - Wean O2 as tolerated to maintain saturations greater than 90%. - Plan for dialysis this morning   ESRD on hemodialysis Tuesday Thursday Saturday -Patient reports compliance with dialysis, last session on Saturday. - Given Lokelma  in the ED for hyperkalemia. - Plan to dialyze this morning - Nephrology assistance appreciated   Treat resistant hypertension - Blood pressure significantly elevated without evidence of endorgan damage - Refractory to multiple antihypertensives given in the ED.  Historically blood pressure only improves with dialysis - home medications resumed   Atrial fibrillation - Continue beta-blocker and Eliquis    No IVF  2g sodium restricted diet  Monitor/replace electrolytes  Eliquis       DVT prophylaxis: apixaban  (ELIQUIS ) tablet 2.5 mg Start: 10/12/23 1445 apixaban  (ELIQUIS ) tablet 2.5 mg    Code Status: Full Code Family Communication:   Disposition Plan:  Level of care: Telemetry Medical Status is: Observation {Observation:23811}    Consultants:  ***  Procedures:  ***  Antimicrobials:  Anti-infectives (From admission, onward)    None        Subjective: ***  Objective: Vitals:   10/13/23 0417 10/13/23 0549 10/13/23 0818 10/13/23 0820  BP: (!) 150/86 (!) 150/86 (!) 156/81   Pulse: 69  80 89  Resp: 18  17   Temp: 99 F (37.2 C)  97.7 F (36.5 C)   TempSrc: Oral  Oral   SpO2: 99%  (!) 88% 91%  Weight:      Height:        Intake/Output Summary (Last 24 hours) at 10/13/2023 2725 Last data filed at 10/12/2023 1328 Gross per 24 hour  Intake --  Output 2.1 ml  Net -2.1 ml   Filed Weights    10/11/23 2346 10/12/23 0950 10/12/23 1330  Weight: 46 kg 47.2 kg 45.6 kg    Examination: Physical Exam:  Constitutional: WN/WD, NAD and appears calm and comfortable Eyes: PERRL, lids and conjunctivae normal, sclerae anicteric  ENMT: External Ears, Nose appear normal. Grossly normal hearing. Mucous membranes are moist. Posterior pharynx clear of any exudate or lesions. Normal dentition.  Neck: Appears normal, supple, no cervical masses, normal ROM, no appreciable thyromegaly Respiratory: Clear to auscultation bilaterally, no wheezing, rales, rhonchi or crackles. Normal respiratory effort and patient is not tachypenic. No accessory muscle use.  Cardiovascular: RRR, no murmurs / rubs / gallops. S1 and S2 auscultated. No extremity edema. 2+ pedal pulses. No carotid bruits.  Abdomen: Soft, non-tender, non-distended. No masses palpated. No appreciable hepatosplenomegaly. Bowel sounds positive.  GU: Deferred. Musculoskeletal: No clubbing / cyanosis of digits/nails. No joint deformity upper and lower extremities. Good ROM, no contractures. Normal strength and muscle tone.  Skin: No rashes, lesions, ulcers. No induration; Warm and dry.  Neurologic: CN 2-12 grossly intact with no focal deficits. Sensation intact in all 4 Extremities, DTR normal. Strength 5/5 in all 4. Romberg sign cerebellar reflexes not assessed.  Psychiatric: Normal judgment and insight. Alert and oriented x 3. Normal mood and appropriate affect.   Data Reviewed: I have personally reviewed following labs and imaging studies  CBC: Recent Labs  Lab 10/12/23 0000 10/12/23 0006  10/13/23 0718  WBC 9.3  --  6.0  NEUTROABS 7.7  --   --   HGB 10.2* 11.9* 9.4*  HCT 31.8* 35.0* 30.1*  MCV 88.3  --  90.1  PLT 165  --  145*   Basic Metabolic Panel: Recent Labs  Lab 10/12/23 0000 10/12/23 0006  NA 138 138  K 5.0 5.2*  CL 100 102  CO2 23  --   GLUCOSE 119* 116*  BUN 59* 65*  CREATININE 10.71* 11.00*  CALCIUM  9.4  --     GFR: Estimated Creatinine Clearance: 3.4 mL/min (A) (by C-G formula based on SCr of 11 mg/dL (H)). Liver Function Tests: Recent Labs  Lab 10/12/23 0000  AST 24  ALT 16  ALKPHOS 70  BILITOT 0.9  PROT 8.0  ALBUMIN  4.1   No results for input(s): "LIPASE", "AMYLASE" in the last 168 hours. No results for input(s): "AMMONIA" in the last 168 hours. Coagulation Profile: No results for input(s): "INR", "PROTIME" in the last 168 hours. Cardiac Enzymes: No results for input(s): "CKTOTAL", "CKMB", "CKMBINDEX", "TROPONINI" in the last 168 hours. BNP (last 3 results) No results for input(s): "PROBNP" in the last 8760 hours. HbA1C: No results for input(s): "HGBA1C" in the last 72 hours. CBG: No results for input(s): "GLUCAP" in the last 168 hours. Lipid Profile: No results for input(s): "CHOL", "HDL", "LDLCALC", "TRIG", "CHOLHDL", "LDLDIRECT" in the last 72 hours. Thyroid  Function Tests: No results for input(s): "TSH", "T4TOTAL", "FREET4", "T3FREE", "THYROIDAB" in the last 72 hours. Anemia Panel: No results for input(s): "VITAMINB12", "FOLATE", "FERRITIN", "TIBC", "IRON", "RETICCTPCT" in the last 72 hours. Sepsis Labs: No results for input(s): "PROCALCITON", "LATICACIDVEN" in the last 168 hours.  No results found for this or any previous visit (from the past 240 hours).   Radiology Studies: DG CHEST PORT 1 VIEW Result Date: 10/12/2023 CLINICAL DATA:  Chest pain and shortness breath EXAM: PORTABLE CHEST 1 VIEW COMPARISON:  10/12/2023 FINDINGS: Right IJ dialysis catheter tip: Right atrium. Prior median sternotomy. Moderate enlargement of the cardiopericardial silhouette with indistinct pulmonary vasculature and hazy interstitial accentuation raising the possibility of interstitial edema or less likely atypical pneumonia. Stable hazy airspace opacity at the right lung base potentially from confluent edema or pneumonia. No blunting of the costophrenic angles. IMPRESSION: 1. Moderate  enlargement of the cardiopericardial silhouette with indistinct pulmonary vasculature and hazy interstitial accentuation raising the possibility of interstitial edema or less likely atypical pneumonia. 2. Stable hazy airspace opacity at the right lung base potentially from confluent edema or pneumonia. 3. Right IJ dialysis catheter tip: Right atrium. Electronically Signed   By: Freida Jes M.D.   On: 10/12/2023 19:03   DG Chest Portable 1 View Result Date: 10/12/2023 CLINICAL DATA:  Shortness of breath EXAM: PORTABLE CHEST 1 VIEW COMPARISON:  09/22/2023 FINDINGS: Right-sided central venous catheter with tip at the right atrium. Post sternotomy changes. Cardiomegaly with diffuse interstitial and ground-glass opacity suspicious for pulmonary edema. This is slightly more confluent at the right base. Possible trace right-sided effusion. Aortic atherosclerosis. No pneumothorax IMPRESSION: Cardiomegaly with diffuse interstitial and ground-glass opacity suspicious for pulmonary edema. Possible trace right-sided effusion. Patchy bibasilar airspace disease right greater than left, probably due to edema though infiltrate is not excluded. Electronically Signed   By: Esmeralda Hedge M.D.   On: 10/12/2023 00:11     Scheduled Meds:  amLODipine   10 mg Oral q AM   apixaban   2.5 mg Oral BID   carvedilol   25 mg Oral BID WC  cholecalciferol   1,000 Units Oral Daily   [START ON 10/14/2023] cinacalcet   60 mg Oral Q T,Th,Sa-HD   cloNIDine   0.1 mg Oral QHS   cloNIDine   0.1 mg Oral Once per day on Sunday Monday Wednesday Friday   heparin   2,000 Units Dialysis Once in dialysis   hydrALAZINE   100 mg Oral Q8H   [START ON 10/14/2023] isosorbide  mononitrate  30 mg Oral Q T,Th,Sa-HD   isosorbide  mononitrate  60 mg Oral Once per day on Sunday Monday Wednesday Friday   pantoprazole   40 mg Oral BID   rosuvastatin   10 mg Oral q AM   sevelamer  carbonate  2.4 g Oral TID WC   sodium zirconium cyclosilicate   10 g Oral Q M,W,F    Continuous Infusions:  anticoagulant sodium citrate        LOS: 0 days    Time spent: *** minutes spent on chart review, discussion with nursing staff, consultants, updating family and interview/physical exam; more than 50% of that time was spent in counseling and/or coordination of care.   Eveline Hipps, DO [facility] Available via Epic secure chat 7am-7pm After these hours, please refer to coverage provider listed on [email or web address] 10/13/2023, 8:22 AM

## 2023-10-19 ENCOUNTER — Ambulatory Visit (HOSPITAL_COMMUNITY): Payer: Medicare Other | Admitting: Physician Assistant

## 2023-10-20 ENCOUNTER — Ambulatory Visit (HOSPITAL_COMMUNITY): Payer: Medicare Other | Admitting: Physician Assistant

## 2023-10-28 ENCOUNTER — Other Ambulatory Visit: Payer: Self-pay

## 2023-10-28 ENCOUNTER — Inpatient Hospital Stay (HOSPITAL_COMMUNITY)
Admission: EM | Admit: 2023-10-28 | Discharge: 2023-10-31 | DRG: 640 | Disposition: A | Discharge status: Home / Self Care | Attending: Internal Medicine | Admitting: Internal Medicine

## 2023-10-28 ENCOUNTER — Emergency Department (HOSPITAL_COMMUNITY)

## 2023-10-28 ENCOUNTER — Inpatient Hospital Stay (HOSPITAL_COMMUNITY)

## 2023-10-28 ENCOUNTER — Encounter (HOSPITAL_COMMUNITY): Payer: Self-pay

## 2023-10-28 DIAGNOSIS — E43 Unspecified severe protein-calorie malnutrition: Secondary | ICD-10-CM | POA: Insufficient documentation

## 2023-10-28 DIAGNOSIS — R54 Age-related physical debility: Secondary | ICD-10-CM | POA: Diagnosis present

## 2023-10-28 DIAGNOSIS — E875 Hyperkalemia: Secondary | ICD-10-CM | POA: Diagnosis present

## 2023-10-28 DIAGNOSIS — Z885 Allergy status to narcotic agent status: Secondary | ICD-10-CM | POA: Diagnosis not present

## 2023-10-28 DIAGNOSIS — Z5329 Procedure and treatment not carried out because of patient's decision for other reasons: Secondary | ICD-10-CM | POA: Diagnosis present

## 2023-10-28 DIAGNOSIS — Z91148 Patient's other noncompliance with medication regimen for other reason: Secondary | ICD-10-CM

## 2023-10-28 DIAGNOSIS — Z8673 Personal history of transient ischemic attack (TIA), and cerebral infarction without residual deficits: Secondary | ICD-10-CM

## 2023-10-28 DIAGNOSIS — F17200 Nicotine dependence, unspecified, uncomplicated: Secondary | ICD-10-CM | POA: Diagnosis present

## 2023-10-28 DIAGNOSIS — Z833 Family history of diabetes mellitus: Secondary | ICD-10-CM

## 2023-10-28 DIAGNOSIS — Z66 Do not resuscitate: Secondary | ICD-10-CM | POA: Diagnosis present

## 2023-10-28 DIAGNOSIS — N2581 Secondary hyperparathyroidism of renal origin: Secondary | ICD-10-CM | POA: Diagnosis present

## 2023-10-28 DIAGNOSIS — I132 Hypertensive heart and chronic kidney disease with heart failure and with stage 5 chronic kidney disease, or end stage renal disease: Secondary | ICD-10-CM | POA: Diagnosis present

## 2023-10-28 DIAGNOSIS — J9601 Acute respiratory failure with hypoxia: Secondary | ICD-10-CM | POA: Diagnosis present

## 2023-10-28 DIAGNOSIS — Z681 Body mass index (BMI) 19 or less, adult: Secondary | ICD-10-CM

## 2023-10-28 DIAGNOSIS — Z992 Dependence on renal dialysis: Secondary | ICD-10-CM

## 2023-10-28 DIAGNOSIS — Z853 Personal history of malignant neoplasm of breast: Secondary | ICD-10-CM

## 2023-10-28 DIAGNOSIS — I169 Hypertensive crisis, unspecified: Secondary | ICD-10-CM | POA: Diagnosis present

## 2023-10-28 DIAGNOSIS — I161 Hypertensive emergency: Secondary | ICD-10-CM | POA: Diagnosis present

## 2023-10-28 DIAGNOSIS — E440 Moderate protein-calorie malnutrition: Secondary | ICD-10-CM | POA: Diagnosis present

## 2023-10-28 DIAGNOSIS — B1920 Unspecified viral hepatitis C without hepatic coma: Secondary | ICD-10-CM | POA: Diagnosis present

## 2023-10-28 DIAGNOSIS — J449 Chronic obstructive pulmonary disease, unspecified: Secondary | ICD-10-CM | POA: Diagnosis present

## 2023-10-28 DIAGNOSIS — I5023 Acute on chronic systolic (congestive) heart failure: Secondary | ICD-10-CM | POA: Diagnosis present

## 2023-10-28 DIAGNOSIS — N189 Chronic kidney disease, unspecified: Secondary | ICD-10-CM

## 2023-10-28 DIAGNOSIS — I480 Paroxysmal atrial fibrillation: Secondary | ICD-10-CM | POA: Diagnosis present

## 2023-10-28 DIAGNOSIS — Z981 Arthrodesis status: Secondary | ICD-10-CM

## 2023-10-28 DIAGNOSIS — Z888 Allergy status to other drugs, medicaments and biological substances status: Secondary | ICD-10-CM

## 2023-10-28 DIAGNOSIS — M159 Polyosteoarthritis, unspecified: Secondary | ICD-10-CM | POA: Diagnosis present

## 2023-10-28 DIAGNOSIS — Z86000 Personal history of in-situ neoplasm of breast: Secondary | ICD-10-CM

## 2023-10-28 DIAGNOSIS — D631 Anemia in chronic kidney disease: Secondary | ICD-10-CM | POA: Diagnosis present

## 2023-10-28 DIAGNOSIS — Z7901 Long term (current) use of anticoagulants: Secondary | ICD-10-CM

## 2023-10-28 DIAGNOSIS — Z8249 Family history of ischemic heart disease and other diseases of the circulatory system: Secondary | ICD-10-CM

## 2023-10-28 DIAGNOSIS — E8770 Fluid overload, unspecified: Secondary | ICD-10-CM | POA: Diagnosis present

## 2023-10-28 DIAGNOSIS — Z886 Allergy status to analgesic agent status: Secondary | ICD-10-CM

## 2023-10-28 DIAGNOSIS — Z79899 Other long term (current) drug therapy: Secondary | ICD-10-CM

## 2023-10-28 DIAGNOSIS — R0902 Hypoxemia: Secondary | ICD-10-CM

## 2023-10-28 DIAGNOSIS — F1910 Other psychoactive substance abuse, uncomplicated: Secondary | ICD-10-CM | POA: Diagnosis present

## 2023-10-28 DIAGNOSIS — Z800 Family history of malignant neoplasm of digestive organs: Secondary | ICD-10-CM

## 2023-10-28 DIAGNOSIS — Z91040 Latex allergy status: Secondary | ICD-10-CM

## 2023-10-28 DIAGNOSIS — Z862 Personal history of diseases of the blood and blood-forming organs and certain disorders involving the immune mechanism: Secondary | ICD-10-CM

## 2023-10-28 DIAGNOSIS — N186 End stage renal disease: Secondary | ICD-10-CM | POA: Diagnosis present

## 2023-10-28 DIAGNOSIS — R000 Tachycardia, unspecified: Secondary | ICD-10-CM | POA: Diagnosis present

## 2023-10-28 DIAGNOSIS — Z87442 Personal history of urinary calculi: Secondary | ICD-10-CM

## 2023-10-28 DIAGNOSIS — Z91199 Patient's noncompliance with other medical treatment and regimen due to unspecified reason: Secondary | ICD-10-CM

## 2023-10-28 DIAGNOSIS — Z91013 Allergy to seafood: Secondary | ICD-10-CM

## 2023-10-28 DIAGNOSIS — I69351 Hemiplegia and hemiparesis following cerebral infarction affecting right dominant side: Secondary | ICD-10-CM | POA: Diagnosis not present

## 2023-10-28 DIAGNOSIS — G9341 Metabolic encephalopathy: Secondary | ICD-10-CM | POA: Diagnosis present

## 2023-10-28 DIAGNOSIS — Z820 Family history of epilepsy and other diseases of the nervous system: Secondary | ICD-10-CM

## 2023-10-28 LAB — CBC WITH DIFFERENTIAL/PLATELET
Abs Immature Granulocytes: 0.04 10*3/uL (ref 0.00–0.07)
Basophils Absolute: 0.1 10*3/uL (ref 0.0–0.1)
Basophils Relative: 1 %
Eosinophils Absolute: 0.1 10*3/uL (ref 0.0–0.5)
Eosinophils Relative: 1 %
HCT: 32.2 % — ABNORMAL LOW (ref 36.0–46.0)
Hemoglobin: 10 g/dL — ABNORMAL LOW (ref 12.0–15.0)
Immature Granulocytes: 1 %
Lymphocytes Relative: 10 %
Lymphs Abs: 0.9 10*3/uL (ref 0.7–4.0)
MCH: 29.2 pg (ref 26.0–34.0)
MCHC: 31.1 g/dL (ref 30.0–36.0)
MCV: 93.9 fL (ref 80.0–100.0)
Monocytes Absolute: 0.8 10*3/uL (ref 0.1–1.0)
Monocytes Relative: 10 %
Neutro Abs: 6.8 10*3/uL (ref 1.7–7.7)
Neutrophils Relative %: 77 %
Platelets: 325 10*3/uL (ref 150–400)
RBC: 3.43 MIL/uL — ABNORMAL LOW (ref 3.87–5.11)
RDW: 21 % — ABNORMAL HIGH (ref 11.5–15.5)
WBC: 8.7 10*3/uL (ref 4.0–10.5)
nRBC: 0.2 % (ref 0.0–0.2)

## 2023-10-28 LAB — I-STAT CHEM 8, ED
BUN: 33 mg/dL — ABNORMAL HIGH (ref 8–23)
Calcium, Ion: 1 mmol/L — ABNORMAL LOW (ref 1.15–1.40)
Chloride: 99 mmol/L (ref 98–111)
Creatinine, Ser: 7.9 mg/dL — ABNORMAL HIGH (ref 0.44–1.00)
Glucose, Bld: 107 mg/dL — ABNORMAL HIGH (ref 70–99)
HCT: 35 % — ABNORMAL LOW (ref 36.0–46.0)
Hemoglobin: 11.9 g/dL — ABNORMAL LOW (ref 12.0–15.0)
Potassium: 5.1 mmol/L (ref 3.5–5.1)
Sodium: 138 mmol/L (ref 135–145)
TCO2: 28 mmol/L (ref 22–32)

## 2023-10-28 LAB — COMPREHENSIVE METABOLIC PANEL WITH GFR
ALT: 13 U/L (ref 0–44)
AST: 19 U/L (ref 15–41)
Albumin: 4.1 g/dL (ref 3.5–5.0)
Alkaline Phosphatase: 70 U/L (ref 38–126)
Anion gap: 15 (ref 5–15)
BUN: 32 mg/dL — ABNORMAL HIGH (ref 8–23)
CO2: 26 mmol/L (ref 22–32)
Calcium: 9.4 mg/dL (ref 8.9–10.3)
Chloride: 97 mmol/L — ABNORMAL LOW (ref 98–111)
Creatinine, Ser: 7.68 mg/dL — ABNORMAL HIGH (ref 0.44–1.00)
GFR, Estimated: 5 mL/min — ABNORMAL LOW (ref >60)
Glucose, Bld: 97 mg/dL (ref 70–99)
Potassium: 5.2 mmol/L — ABNORMAL HIGH (ref 3.5–5.1)
Sodium: 138 mmol/L (ref 135–145)
Total Bilirubin: 0.8 mg/dL (ref 0.0–1.2)
Total Protein: 7.9 g/dL (ref 6.5–8.1)

## 2023-10-28 MED ORDER — ISOSORBIDE MONONITRATE ER 30 MG PO TB24
30.0000 mg | ORAL_TABLET | Freq: Every day | ORAL | Status: DC
  Administered 2023-10-28 – 2023-10-31 (×4): 30 mg via ORAL
  Filled 2023-10-28 (×4): qty 1

## 2023-10-28 MED ORDER — AMLODIPINE BESYLATE 5 MG PO TABS
5.0000 mg | ORAL_TABLET | Freq: Every day | ORAL | Status: DC
  Administered 2023-10-28 – 2023-10-30 (×3): 5 mg via ORAL
  Filled 2023-10-28 (×3): qty 1

## 2023-10-28 MED ORDER — HYDRALAZINE HCL 50 MG PO TABS
100.0000 mg | ORAL_TABLET | Freq: Three times a day (TID) | ORAL | Status: DC
  Administered 2023-10-28 – 2023-10-31 (×7): 100 mg via ORAL
  Filled 2023-10-28: qty 4
  Filled 2023-10-28 (×6): qty 2

## 2023-10-28 MED ORDER — APIXABAN 2.5 MG PO TABS
2.5000 mg | ORAL_TABLET | Freq: Two times a day (BID) | ORAL | Status: DC
  Administered 2023-10-28 – 2023-10-31 (×6): 2.5 mg via ORAL
  Filled 2023-10-28 (×7): qty 1

## 2023-10-28 MED ORDER — DOCUSATE SODIUM 100 MG PO CAPS
100.0000 mg | ORAL_CAPSULE | Freq: Two times a day (BID) | ORAL | Status: DC | PRN
  Administered 2023-10-29: 100 mg via ORAL
  Filled 2023-10-28: qty 1

## 2023-10-28 MED ORDER — PANTOPRAZOLE SODIUM 40 MG PO TBEC
40.0000 mg | DELAYED_RELEASE_TABLET | Freq: Two times a day (BID) | ORAL | Status: DC
  Administered 2023-10-28 – 2023-10-31 (×6): 40 mg via ORAL
  Filled 2023-10-28 (×6): qty 1

## 2023-10-28 MED ORDER — NITROGLYCERIN 2 % TD OINT: 1.0000 [in_us] | TOPICAL_OINTMENT | Freq: Once | TRANSDERMAL | Status: DC

## 2023-10-28 MED ORDER — HYDROMORPHONE HCL 1 MG/ML IJ SOLN
0.5000 mg | Freq: Once | INTRAMUSCULAR | Status: AC | PRN
  Administered 2023-10-28: 0.5 mg via INTRAVENOUS
  Filled 2023-10-28: qty 0.5

## 2023-10-28 MED ORDER — TRIMETHOBENZAMIDE HCL 100 MG/ML IM SOLN
200.0000 mg | Freq: Four times a day (QID) | INTRAMUSCULAR | Status: DC | PRN
  Administered 2023-10-29: 200 mg via INTRAMUSCULAR
  Filled 2023-10-28 (×2): qty 2

## 2023-10-28 MED ORDER — POLYETHYLENE GLYCOL 3350 17 G PO PACK: 17.0000 g | PACK | Freq: Every day | ORAL | Status: DC | PRN

## 2023-10-28 MED ORDER — CARVEDILOL 25 MG PO TABS
25.0000 mg | ORAL_TABLET | Freq: Two times a day (BID) | ORAL | Status: DC
  Administered 2023-10-28 – 2023-10-31 (×6): 25 mg via ORAL
  Filled 2023-10-28 (×2): qty 1
  Filled 2023-10-28: qty 2
  Filled 2023-10-28: qty 1
  Filled 2023-10-28: qty 2
  Filled 2023-10-28: qty 1

## 2023-10-28 MED ORDER — HEPARIN SODIUM (PORCINE) 1000 UNIT/ML IJ SOLN
INTRAMUSCULAR | Status: AC
  Filled 2023-10-28: qty 4

## 2023-10-28 MED ORDER — METOPROLOL TARTRATE 5 MG/5ML IV SOLN: 5.0000 mg | INTRAVENOUS | Status: DC | PRN

## 2023-10-28 MED ORDER — ACETAMINOPHEN 325 MG PO TABS
650.0000 mg | ORAL_TABLET | Freq: Four times a day (QID) | ORAL | Status: DC | PRN
  Administered 2023-10-28 – 2023-10-29 (×3): 650 mg via ORAL
  Filled 2023-10-28 (×3): qty 2

## 2023-10-28 MED ORDER — ROSUVASTATIN CALCIUM 5 MG PO TABS
10.0000 mg | ORAL_TABLET | Freq: Every morning | ORAL | Status: DC
  Administered 2023-10-28 – 2023-10-31 (×4): 10 mg via ORAL
  Filled 2023-10-28 (×4): qty 2

## 2023-10-28 MED ORDER — TRAZODONE HCL 50 MG PO TABS
25.0000 mg | ORAL_TABLET | Freq: Every evening | ORAL | Status: DC | PRN
  Administered 2023-10-30 – 2023-10-31 (×2): 50 mg via ORAL
  Filled 2023-10-28 (×2): qty 1

## 2023-10-28 MED ORDER — NITROGLYCERIN IN D5W 200-5 MCG/ML-% IV SOLN
0.0000 ug/min | INTRAVENOUS | Status: DC
  Administered 2023-10-28: 5 ug/min via INTRAVENOUS

## 2023-10-28 MED ORDER — CLONIDINE HCL 0.1 MG PO TABS
0.1000 mg | ORAL_TABLET | Freq: Every day | ORAL | Status: DC
  Administered 2023-10-28 – 2023-10-29 (×2): 0.1 mg via ORAL
  Filled 2023-10-28 (×2): qty 1

## 2023-10-28 MED ORDER — HYDRALAZINE HCL 20 MG/ML IJ SOLN
10.0000 mg | INTRAMUSCULAR | Status: DC | PRN
  Administered 2023-10-28 – 2023-10-29 (×3): 20 mg via INTRAVENOUS
  Filled 2023-10-28 (×3): qty 1

## 2023-10-28 NOTE — Assessment & Plan Note (Signed)
-   Continue home Crestor , apixaban

## 2023-10-28 NOTE — ED Triage Notes (Signed)
 Pt bibgcems woke up to go to dialysis but having trouble breathing sating at 85% RA and refused CPAP, IV or medication with EMS.   Bp 224/130 Hr 80 Rr 30-36 100% NRB  EtCO2- 20-26

## 2023-10-28 NOTE — Assessment & Plan Note (Signed)
-   Consult Nephrology for HD

## 2023-10-28 NOTE — Procedures (Signed)
 I was present at the procedure, reviewed the HD regimen and made appropriate changes.   Larry Poag MD  [facility] 10/28/2023, 2:26 PM

## 2023-10-28 NOTE — Progress Notes (Signed)
   10/28/23 1807  Vitals  Temp 98.2 F (36.8 C)  Pulse Rate 85  Resp (!) 22  BP (!) 198/105 (Simultaneous filing. User may not have seen previous data.)  SpO2 94 %  O2 Device Room Air  Weight  (pt in stretcher unable to stand to obtain weight)  Oxygen  Therapy  Patient Activity (if Appropriate) In bed  Pulse Oximetry Type Continuous  Oximetry Probe Site Changed No  Post Treatment  Dialyzer Clearance Lightly streaked  Hemodialysis Intake (mL) 0 mL  Liters Processed 78  Fluid Removed (mL) 2000 mL  Tolerated HD Treatment Yes  Post-Hemodialysis Comments Pt unable to remove ordered 3.5L due to complaints of cramping. MD[facility] notified of 2L removal.   Received patient in bed to unit.  Alert and oriented.  Informed consent signed and in chart.   TX duration: Three hours and thirty minutes  Patient tolerated well.  Transported back to the room  Alert, without acute distress.  Hand-off given to patient's nurse.   Access used: Right chest HD catheter Access issues: None

## 2023-10-28 NOTE — Assessment & Plan Note (Signed)
 Presented with respiratory distress, was hypoxic and requiring CPAP which she refused.  This improved with management of her hypertensive emergency - Consult Nephrology for HD

## 2023-10-28 NOTE — ED Notes (Signed)
 Patient's belongings found to be left in ED room after patient taken to dialysis, patient's belongings taken upstairs by transporter to her inpatient room for when she is done with dialysis.

## 2023-10-28 NOTE — Assessment & Plan Note (Signed)
 Cessation recommended.

## 2023-10-28 NOTE — Assessment & Plan Note (Signed)
 Hypertensive emergency Presetned with BP 240/140 as well as acute CHF and likely acute metabolic encephalopathy (given she was refusing appropriate cares, which she does not remember doing on my interview) Started on nitroglycerin  drip in the ER.  Evaluated by CCM and started on nitroglycerin  drip.  BP rapidly improved and BP now 160s systolic off drip.  Encephaloapthy appears resolved, she has stable baseline dysarthria and right hemiparesis - Hold drip - Cautiously restart home oral agents

## 2023-10-28 NOTE — ED Notes (Signed)
 RN Ronni Colace assisted with obtaining another PIV with no success, will place IV team consult.

## 2023-10-28 NOTE — H&P (Signed)
 History and Physical    Patient: Monique Henderson:[id number] DOB: [birth year] DOA: 10/28/2023 DOS: the patient was seen and examined on 10/28/2023 PCP: Charle Congo, MD  Patient coming from: Home  Chief Complaint:  Chief Complaint  Patient presents with   Shortness of Breath       HPI:  73 y.o. F with ESRD on HD TThS, frequent admissions for fluids overload (10th admission since Jan), hx CVA with right hemiparesis, pAF on Eliquis , active smoker, sCHF EF 45% who presented with SOB.  Most recent admission for fluid overload was 2 weeks ago and she left against medical advice.    Today called EMS complaining of having trouble breathing.  EMS found her hypoxic to 85%, refusing CPAP, medications with EMS.  Arrived with BP 240/140, edema on CXR.  Started on nitroglycerin  infusion and BP improved and mentation improved.  Nephrology consulted.    To me she reported adherence to all medicines, not having missed dialysis that she can remember, and strictly avoiding all illicit drugs.  CCM consulted initially, recommended continued nitroglycerin  drip, resumption of home oral medications, and admission to medicine service.      Review of Systems  Respiratory:  Positive for shortness of breath. Negative for cough, hemoptysis, sputum production and wheezing.   Cardiovascular:  Positive for chest pain.  Neurological:  Positive for headaches.  All other systems reviewed and are negative.    Past Medical History:  Diagnosis Date   Acute cardioembolic stroke (HCC) 02/26/2022   AF (paroxysmal atrial fibrillation) (HCC) 05/29/2019   on Coumadin    Aortic atherosclerosis (HCC) 07/05/2019   Aortic dissection (HCC) 04/04/2019   s/p repair   Bone spur 2008   Right calcaneal foot spur   Breast cancer (HCC) 2004   Ductal carcinoma in situ of the left breast; S/P left partial mastectomy 02/26/2003; S/P re-excision of cranial and lateral margins11/18/2004.radiation   Carotid artery disease  (HCC)    Cerebral thrombosis with cerebral infarction 05/22/2019   Chronic HFrEF (heart failure with reduced ejection fraction) (HCC)    Chronic low back pain 06/22/2016   Chronic obstructive lung disease (HCC) 01/16/2017   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 Lillette Reid; S/P re-excision of superior and inferior margins 10/27/2012.    Dissection of aorta (HCC) 04/03/2019   ESRD on hemodialysis (HCC) 05/29/2019   Essential hypertension 09/16/2006   GERD 09/16/2006   Hepatitis C    treated and RNA confirmed not detectable 01/2017   Insomnia 03/14/2015   Malnutrition of moderate degree 05/19/2019   Non compliance with medical treatment 12/04/2017   Normocytic anemia    With thrombocytosis   Osteoarthritis    Right ureteral stone 2002   S/P lumbar spinal fusion 01/18/2014   S/P lumbar decompressive laminectomy, fusion, and plating for lumbar spinal stenosis on 05/27/2009 by Dr. Isadora Mar.  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 Isadora Mar.     Stroke (cerebrum) [facility])    Stroke (HCC) 02/23/2022   SVT (supraventricular tachycardia) (HCC) 08/28/2019   Tobacco use disorder 04/19/2009   Uterine fibroid    Wears dentures    top   Past Surgical History:  Procedure Laterality Date   ANTERIOR LAT LUMBAR FUSION N/A 01/18/2014   Procedure: ANTERIOR LATERAL LUMBAR FUSION  LUMBAR TWO-THREE;  Surgeon: Isadora Mar, MD;  Location: [facility];  Service: Neurosurgery;  Laterality: N/A;   ANTERIOR LUMBAR FUSION  01/18/2014   AV FISTULA PLACEMENT Left 04/20/2019   Procedure: ARTERIOVENOUS (AV) FISTULA CREATION;  Surgeon: Adine Hoof, MD;  Location: [facility] OR;  Service: Vascular;  Laterality: Left;   BACK SURGERY     BREAST  LUMPECTOMY Left 01/2003   BREAST LUMPECTOMY Right 2014   BREAST LUMPECTOMY WITH NEEDLE LOCALIZATION AND AXILLARY SENTINEL LYMPH NODE BX Right 10/13/2012   Procedure: BREAST LUMPECTOMY WITH NEEDLE LOCALIZATION;  Surgeon: Mayme Spearman, MD;  Location: [facility];  Service: General;  Laterality: Right;  Right breast wire localized lumpectomy   DIALYSIS/PERMA CATHETER INSERTION N/A 05/28/2023   Procedure: DIALYSIS/PERMA CATHETER INSERTION;  Surgeon: Patrick Boor, MD;  Location: [facility];  Service: Cardiovascular;  Laterality: N/A;   INSERTION OF DIALYSIS CATHETER Right 04/20/2019   Procedure: INSERTION OF DIALYSIS CATHETER, right internal jugular;  Surgeon: Adine Hoof, MD;  Location: [facility] OR;  Service: Vascular;  Laterality: Right;   INSERTION OF DIALYSIS CATHETER Right 09/24/2020   Procedure: INSERTION OF TUNNELED DIALYSIS CATHETER;  Surgeon: Mayo Speck, MD;  Location: [facility] OR;  Service: Vascular;  Laterality: Right;   IR FLUORO GUIDE CV LINE RIGHT  09/22/2020   IR THORACENTESIS ASP PLEURAL SPACE W/IMG GUIDE  05/19/2019   IR US  GUIDE VASC ACCESS LEFT  09/22/2020   IR US  GUIDE VASC ACCESS RIGHT  09/22/2020   IR VENOCAVAGRAM SVC  09/22/2020   LAMINECTOMY  05/27/2009   Lumbar decompressive laminectomy, fusion and plating for lumbar spinal stensosis   LIGATION OF ARTERIOVENOUS  FISTULA Left 09/24/2020   Procedure: LIGATION OF LEFT ARM ARTERIOVENOUS  FISTULA;  Surgeon: Mayo Speck, MD;  Location: [facility] OR;  Service: Vascular;  Laterality: Left;   LUMBAR LAMINECTOMY/DECOMPRESSION MICRODISCECTOMY Left 03/23/2013   Procedure: LUMBAR LAMINECTOMY/DECOMPRESSION MICRODISCECTOMY 1 LEVEL;  Surgeon: Isadora Mar, MD;  Location: [facility];  Service: Neurosurgery;  Laterality: Left;  LUMBAR LAMINECTOMY/DECOMPRESSION MICRODISCECTOMY 1 LEVEL   MASTECTOMY, PARTIAL Left 02/26/2003   ; S/P re-excision of cranial and lateral margins 04/19/2003.    RE-EXCISION OF BREAST CANCER,SUPERIOR  MARGINS Right 10/27/2012   Procedure: RE-EXCISION OF BREAST CANCER,SUPERIOR and inferior MARGINS;  Surgeon: Mayme Spearman, MD;  Location: [facility] OR;  Service: General;  Laterality: Right;   RE-EXCISION OF BREAST LUMPECTOMY Left 04/2003   TEE WITHOUT CARDIOVERSION N/A 04/04/2019   Procedure: Transesophageal Echocardiogram (Tee);  Surgeon: Rudine Cos, MD;  Location: [facility] OR;  Service: Open Heart Surgery;  Laterality: N/A;   THORACIC AORTIC ANEURYSM REPAIR N/A 04/04/2019   Procedure: THORACIC ASCENDING ANEURYSM REPAIR (AAA)  USING 28 MM X 30 CM HEMASHIELD PLATINUM VASCULAR GRAFT;  Surgeon: Rudine Cos, MD;  Location: [facility] OR;  Service: Open Heart Surgery;  Laterality: N/A;   Social History:  reports that she quit smoking about 3 years ago. Her smoking use included cigarettes. She started smoking about 47 years ago. She has a 11 pack-year smoking history. She has never used smokeless tobacco. She reports that she does not currently use alcohol  after a past usage of about 2.0 standard drinks of alcohol  per week. She reports that she does not currently use drugs after having used the following drugs: Cocaine .  Allergies  Allergen Reactions   Shrimp [Shellfish Allergy] Shortness Of Breath   Bactroban  [Mupirocin ] Other (See Comments)    "Sores in nose"  Hydrocodone  Itching, Nausea And Vomiting and Nausea Only   Chlorhexidine  Gluconate Itching   Latex Itching    Latex gloves cause itchiness and a rash   Tylenol  [Acetaminophen ] Itching    Takes Percocet at home 05/06/23   Zestril  [Lisinopril ] Cough    Family History  Problem Relation Age of Onset   Colon cancer Mother 85   Hypertension Mother    Diabetes Sister 61   Hypertension Sister    Diabetes Brother    Hypertension Brother    Diabetes Brother    Hypertension Brother    Kidney disease Son        s/p renal transplant   Hypertension Son    Diabetes Son    Multiple sclerosis Son    Bone cancer Sister 32   Breast cancer Neg Hx     Cervical cancer Neg Hx     Prior to Admission medications   Medication Sig Start Date End Date Taking? Authorizing Provider  acetaminophen  (TYLENOL ) 325 MG tablet Take 650 mg by mouth every 6 (six) hours as needed for mild pain (pain score 1-3) or moderate pain (pain score 4-6).    [provider]  albuterol  (PROVENTIL ) (2.5 MG/3ML) 0.083% nebulizer solution Take 3 mLs (2.5 mg total) by nebulization every 6 (six) hours as needed for wheezing or shortness of breath. 08/23/23   Pokhrel, Amador Bad, MD  albuterol  (VENTOLIN  HFA) 108 (90 Base) MCG/ACT inhaler Inhale 2 puffs into the lungs every 6 (six) hours as needed. 10/02/23   [provider]  amLODipine  (NORVASC ) 10 MG tablet Take 1 tablet (10 mg total) by mouth daily. Patient taking differently: Take 10 mg by mouth in the morning. 03/06/22   Setzer, Sandra J, PA-C  apixaban  (ELIQUIS ) 2.5 MG TABS tablet Take 1 tablet (2.5 mg total) by mouth 2 (two) times daily. Patient taking differently: Take 2.5 mg by mouth 2 (two) times daily. Take 1 tablet by mouth daily in the morning and 1 tablet by mouth daily at night. 03/06/22   Setzer, Sandra J, PA-C  carvedilol  (COREG ) 25 MG tablet Take 1 tablet (25 mg total) by mouth 2 (two) times daily with a meal. Patient taking differently: Take 25 mg by mouth 2 (two) times daily with a meal. Take 1 tablet by mouth daily in the morning and 1 tablet by mouth daily in the evening. 05/01/22 06/16/24  Ghimire, Estil Heman, MD  Cholecalciferol  (VITAMIN D -3) 125 MCG (5000 UT) TABS Take 1 tablet by mouth in the morning.    [provider]  cinacalcet  (SENSIPAR ) 60 MG tablet Take 60 mg by mouth every dialysis (Tuesday, Thursday, and Saturday).    [provider]  cloNIDine  (CATAPRES ) 0.1 MG tablet Take 1 tablet (0.1 mg total) by mouth 2 (two) times daily. Do not take it before dialysis Patient taking differently: Take 0.1 mg by mouth 2 (two) times daily. Do not take it before dialysis. Take 1 tablet by  mouth daily in the morning and 1 tablet by mouth daily in the evening. 09/02/23   Deforest Fast, MD  hydrALAZINE  (APRESOLINE ) 100 MG tablet Take 1 tablet (100 mg total) by mouth every 8 (eight) hours. Patient taking differently: Take 100 mg by mouth 3 (three) times daily. Take 1 tablet by mouth daily in the morning, 1 tablet by mouth daily at noon, and 1 tablet by mouth daily every night. 12/18/22   Lucendia Rusk, MD  isosorbide  mononitrate (IMDUR ) 30 MG 24 hr tablet TAKE 1 TABLET (30MG ) ON  DIALYSIS DAYS AND 2 TABLETS (60 MG) ON NON DIALYSIS DAYS. 01/12/23   Lucendia Rusk, MD  LOKELMA  10 g PACK packet Take 10 g by mouth every Monday, Wednesday, and Friday. 07/30/23   [provider]  naloxone  (NARCAN ) nasal spray 4 mg/0.1 mL Place 1 spray into the nose as needed (opioid overdose). 02/19/22   [provider]  pantoprazole  (PROTONIX ) 40 MG tablet TAKE 1 TABLET (40 MG TOTAL) BY MOUTH 2 (TWO) TIMES DAILY. Patient taking differently: Take 40 mg by mouth 2 (two) times daily. Take 1 tablet by mouth daily in the morning and 1 tablet by mouth daily in the evening. 05/13/23   Abraham Abo, MD  rosuvastatin  (CRESTOR ) 10 MG tablet TAKE 1 TABLET (10 MG TOTAL) BY MOUTH DAILY FOR CHOLESTEROL Patient taking differently: Take 10 mg by mouth in the morning. 08/07/22   Abraham Abo, MD  senna-docusate (SENOKOT-S) 8.6-50 MG tablet Take 1 tablet by mouth 2 (two) times daily between meals as needed for mild constipation. 08/14/22   Gonfa, Taye T, MD  traZODone  (DESYREL ) 50 MG tablet Take 25-50 mg by mouth at bedtime as needed for sleep. 03/19/23   [provider]    Physical Exam: Vitals:   10/28/23 1018 10/28/23 1030 10/28/23 1045 10/28/23 1115  BP:  (!) 146/80  (!) 160/89  Pulse:  79 77 80  Resp:  (!) 28 (!) 27 (!) 26  Temp: 97.7 F (36.5 C)     TempSrc: Oral     SpO2:  100% 100% 100%   General appearance: chronically ill appearing adult female, tired, lying in bed,  responds to questions  Cor:  RRR, without murmurs, rubs.  JVP normal, no LE edema Resp: Tachypneic, reduced sounds bilaterally, crackles noted.   Abd:  No TTP or rebound all quadrants.  No masses or organomegaly.   No ascites, distension.  MSK: Reducd muscle mass and fat.  Symmetrical without gross deformities of the hands, large joints, or legs. Skin:  cap refill normal, Skin intact without significant rashes or lesions. Neuro:  Speech is dysarthric at baseline.  Naming is grossly intact, patient's recall, both recent and remote, seem within normal limits.  Muscle tone normal, strength weak on right, face asymmetric at baseline         Data Reviewed: BMP shows hyperkalemia, elevated creatinine BUN only 30s LFTs normal CBC with mild CKD associated anemia EKG with sinus tachycardia, old BBB, personally reviewed CXR with edema, personally reviewed     Assessment and Plan: * Hypertensive crisis Hypertensive emergency Presetned with BP 240/140 as well as acute CHF and likely acute metabolic encephalopathy (given she was refusing appropriate cares, which she does not remember doing on my interview) Started on nitroglycerin  drip in the ER.  Evaluated by CCM and started on nitroglycerin  drip.  BP rapidly improved and BP now 160s systolic off drip.  Encephaloapthy appears resolved, she has stable baseline dysarthria and right hemiparesis - Hold drip - Cautiously restart home oral agents    Fluid overload - Consult Nephrology for HD  Acute hypoxemic respiratory failure (HCC) Presented with respiratory distress, was hypoxic and requiring CPAP which she refused.  This improved with management of her hypertensive emergency - Consult Nephrology for HD  Acute on chronic HFrEF (heart failure with reduced ejection fraction) (HCC) - Consult Nephrology for HD    Hyperkalemia - Consult Nephrology for HD - Defer Lokelma  for now  Hemiparesis affecting right side as late effect of  cerebrovascular accident (CVA) (  HCC)    Polysubstance abuse (HCC) Denies  Paroxysmal atrial fibrillation (HCC) In sinus rhythm here - Continue Coreg , apixaban   History of CVA (cerebrovascular accident) - Continue home Crestor , apixaban   Non compliance with medical treatment    Tobacco use disorder Cessation recommended  History of anemia due to CKD Hgb stable, no bleeding reported or osberved  ESRD (end stage renal disease) (HCC) - Consult Nephrology  Malnutrition of moderate degree - Consult dietitian         Advance Care Planning: FULL CODE, discussed with patient, DNR in media tab appears to be out of date  Consults: Nephrology  Family Communication: None present  Severity of Illness: The appropriate patient status for this patient is INPATIENT. Inpatient status is judged to be reasonable and necessary in order to provide the required intensity of service to ensure the patient's safety. The patient's presenting symptoms, physical exam findings, and initial radiographic and laboratory data in the context of their chronic comorbidities is felt to place them at high risk for further clinical deterioration. Furthermore, it is not anticipated that the patient will be medically stable for discharge from the hospital within 2 midnights of admission.   * I certify that at the point of admission it is my clinical judgment that the patient will require inpatient hospital care spanning beyond 2 midnights from the point of admission due to high intensity of service, high risk for further deterioration and high frequency of surveillance required.*  Author: Ephriam Hashimoto, MD 10/28/2023 11:56 AM  For on call review [email or web address].

## 2023-10-28 NOTE — [facility] Narrative Medicine (Deleted)
 Renal Service Consult Note [facility]  Monique Henderson 10/28/2023 Lynae Sandifer, MD Requesting Physician: Dr. Nora Beal  Reason for Consult: ESRD pt w/ resp distress HPI: The patient is a 73 y.o. year-old w/ PMH as below who presented to ED c/o SOB acute onset this am. 85% on RA per EMS, refused CPAP, IV or meds per EMS. In ED BP 237/159, HR 117, RR 19- 37. K+ 5.1, creat 7.90. CXR bilat IS edema, CM. Pt was started on IV NTG drip which has been titrated. We are asked to see for dialysis.    Pt seen in ED room. Pt is on an NRB mask, vague historian. She is visibly SOB. BP 230/160.    ROS - denies CP, no joint pain, no HA, no blurry vision, no rash, no diarrhea, no nausea/ vomiting  PMH: PAF Stroke Aoritc dissection Breast cancer HFrEF ESRD on HD HTN Hep C OA  Past Surgical History  Past Surgical History:  Procedure Laterality Date   ANTERIOR LAT LUMBAR FUSION N/A 01/18/2014   Procedure: ANTERIOR LATERAL LUMBAR FUSION LUMBAR TWO-THREE;  Surgeon: Isadora Mar, MD;  Location: [facility];  Service: Neurosurgery;  Laterality: N/A;   ANTERIOR LUMBAR FUSION  01/18/2014   AV FISTULA PLACEMENT Left 04/20/2019   Procedure: ARTERIOVENOUS (AV) FISTULA CREATION;  Surgeon: Adine Hoof, MD;  Location: [facility] OR;  Service: Vascular;  Laterality: Left;   BACK SURGERY     BREAST LUMPECTOMY Left 01/2003   BREAST LUMPECTOMY Right 2014   BREAST LUMPECTOMY WITH NEEDLE LOCALIZATION AND AXILLARY SENTINEL LYMPH NODE BX Right 10/13/2012   Procedure: BREAST LUMPECTOMY WITH NEEDLE LOCALIZATION;  Surgeon: Mayme Spearman, MD;  Location: [facility];  Service: General;  Laterality: Right;  Right breast wire localized lumpectomy   DIALYSIS/PERMA CATHETER INSERTION N/A 05/28/2023   Procedure: DIALYSIS/PERMA CATHETER INSERTION;  Surgeon: Patrick Boor, MD;  Location: [facility];  Service: Cardiovascular;  Laterality: N/A;   INSERTION OF DIALYSIS CATHETER  Right 04/20/2019   Procedure: INSERTION OF DIALYSIS CATHETER, right internal jugular;  Surgeon: Adine Hoof, MD;  Location: [facility] OR;  Service: Vascular;  Laterality: Right;   INSERTION OF DIALYSIS CATHETER Right 09/24/2020   Procedure: INSERTION OF TUNNELED DIALYSIS CATHETER;  Surgeon: Mayo Speck, MD;  Location: [facility] OR;  Service: Vascular;  Laterality: Right;   IR FLUORO GUIDE CV LINE RIGHT  09/22/2020   IR THORACENTESIS ASP PLEURAL SPACE W/IMG GUIDE  05/19/2019   IR US  GUIDE VASC ACCESS LEFT  09/22/2020   IR US  GUIDE VASC ACCESS RIGHT  09/22/2020   IR VENOCAVAGRAM SVC  09/22/2020   LAMINECTOMY  05/27/2009   Lumbar decompressive laminectomy, fusion and plating for lumbar spinal stensosis   LIGATION OF ARTERIOVENOUS  FISTULA Left 09/24/2020   Procedure: LIGATION OF LEFT ARM ARTERIOVENOUS  FISTULA;  Surgeon: Mayo Speck, MD;  Location: [facility] OR;  Service: Vascular;  Laterality: Left;   LUMBAR LAMINECTOMY/DECOMPRESSION MICRODISCECTOMY Left 03/23/2013   Procedure: LUMBAR LAMINECTOMY/DECOMPRESSION MICRODISCECTOMY 1 LEVEL;  Surgeon: Isadora Mar, MD;  Location: [facility];  Service: Neurosurgery;  Laterality: Left;  LUMBAR LAMINECTOMY/DECOMPRESSION MICRODISCECTOMY 1 LEVEL   MASTECTOMY, PARTIAL Left 02/26/2003   ; S/P re-excision of cranial and lateral margins 04/19/2003.    RE-EXCISION OF BREAST CANCER,SUPERIOR MARGINS Right 10/27/2012   Procedure: RE-EXCISION OF BREAST CANCER,SUPERIOR and inferior MARGINS;  Surgeon: Mayme Spearman, MD;  Location: [facility] OR;  Service: General;  Laterality: Right;  RE-EXCISION OF BREAST LUMPECTOMY Left 04/2003   TEE WITHOUT CARDIOVERSION N/A 04/04/2019   Procedure: Transesophageal Echocardiogram (Tee);  Surgeon: Rudine Cos, MD;  Location: [facility] OR;  Service: Open Heart Surgery;  Laterality: N/A;   THORACIC AORTIC ANEURYSM REPAIR N/A 04/04/2019   Procedure: THORACIC ASCENDING ANEURYSM REPAIR (AAA)  USING 28 MM X 30 CM HEMASHIELD PLATINUM VASCULAR GRAFT;   Surgeon: Rudine Cos, MD;  Location: [facility] OR;  Service: Open Heart Surgery;  Laterality: N/A;   Family History  Family History  Problem Relation Age of Onset   Colon cancer Mother 71   Hypertension Mother    Diabetes Sister 72   Hypertension Sister    Diabetes Brother    Hypertension Brother    Diabetes Brother    Hypertension Brother    Kidney disease Son        s/p renal transplant   Hypertension Son    Diabetes Son    Multiple sclerosis Son    Bone cancer Sister 62   Breast cancer Neg Hx    Cervical cancer Neg Hx    Social History  reports that she quit smoking about 3 years ago. Her smoking use included cigarettes. She started smoking about 47 years ago. She has a 11 pack-year smoking history. She has never used smokeless tobacco. She reports that she does not currently use alcohol  after a past usage of about 2.0 standard drinks of alcohol  per week. She reports that she does not currently use drugs after having used the following drugs: Cocaine . Allergies  Allergies  Allergen Reactions   Shrimp [Shellfish Allergy] Shortness Of Breath   Bactroban  [Mupirocin ] Other (See Comments)    "Sores in nose"   Hydrocodone  Itching, Nausea And Vomiting and Nausea Only   Chlorhexidine  Gluconate Itching   Latex Itching    Latex gloves cause itchiness and a rash   Tylenol  [Acetaminophen ] Itching    Takes Percocet at home 05/06/23   Zestril  [Lisinopril ] Cough   Home medications Prior to Admission medications   Medication Sig Start Date End Date Taking? Authorizing Provider  acetaminophen  (TYLENOL ) 325 MG tablet Take 650 mg by mouth every 6 (six) hours as needed for mild pain (pain score 1-3) or moderate pain (pain score 4-6).    [provider]  albuterol  (PROVENTIL ) (2.5 MG/3ML) 0.083% nebulizer solution Take 3 mLs (2.5 mg total) by nebulization every 6 (six) hours as needed for wheezing or shortness of breath. 08/23/23   Pokhrel, Amador Bad, MD  albuterol  (VENTOLIN  HFA) 108 (90  Base) MCG/ACT inhaler Inhale 2 puffs into the lungs every 6 (six) hours as needed. 10/02/23   [provider]  amLODipine  (NORVASC ) 10 MG tablet Take 1 tablet (10 mg total) by mouth daily. Patient taking differently: Take 10 mg by mouth in the morning. 03/06/22   Setzer, Sandra J, PA-C  apixaban  (ELIQUIS ) 2.5 MG TABS tablet Take 1 tablet (2.5 mg total) by mouth 2 (two) times daily. Patient taking differently: Take 2.5 mg by mouth 2 (two) times daily. Take 1 tablet by mouth daily in the morning and 1 tablet by mouth daily at night. 03/06/22   Setzer, Sandra J, PA-C  carvedilol  (COREG ) 25 MG tablet Take 1 tablet (25 mg total) by mouth 2 (two) times daily with a meal. Patient taking differently: Take 25 mg by mouth 2 (two) times daily with a meal. Take 1 tablet by mouth daily in the morning and 1 tablet by mouth daily in the evening. 05/01/22 06/16/24  Ghimire, Estil Heman, MD  Cholecalciferol  (VITAMIN D -3) 125 MCG (5000 UT) TABS Take 1 tablet by mouth in the morning.    [provider]  cinacalcet  (SENSIPAR ) 60 MG tablet Take 60 mg by mouth every dialysis (Tuesday, Thursday, and Saturday).    [provider]  cloNIDine  (CATAPRES ) 0.1 MG tablet Take 1 tablet (0.1 mg total) by mouth 2 (two) times daily. Do not take it before dialysis Patient taking differently: Take 0.1 mg by mouth 2 (two) times daily. Do not take it before dialysis. Take 1 tablet by mouth daily in the morning and 1 tablet by mouth daily in the evening. 09/02/23   Deforest Fast, MD  hydrALAZINE  (APRESOLINE ) 100 MG tablet Take 1 tablet (100 mg total) by mouth every 8 (eight) hours. Patient taking differently: Take 100 mg by mouth 3 (three) times daily. Take 1 tablet by mouth daily in the morning, 1 tablet by mouth daily at noon, and 1 tablet by mouth daily every night. 12/18/22   Lucendia Rusk, MD  isosorbide  mononitrate (IMDUR ) 30 MG 24 hr tablet TAKE 1 TABLET (30MG ) ON DIALYSIS DAYS AND 2 TABLETS (60 MG) ON NON  DIALYSIS DAYS. 01/12/23   Lucendia Rusk, MD  LOKELMA  10 g PACK packet Take 10 g by mouth every Monday, Wednesday, and Friday. 07/30/23   [provider]  naloxone  (NARCAN ) nasal spray 4 mg/0.1 mL Place 1 spray into the nose as needed (opioid overdose). 02/19/22   [provider]  pantoprazole  (PROTONIX ) 40 MG tablet TAKE 1 TABLET (40 MG TOTAL) BY MOUTH 2 (TWO) TIMES DAILY. Patient taking differently: Take 40 mg by mouth 2 (two) times daily. Take 1 tablet by mouth daily in the morning and 1 tablet by mouth daily in the evening. 05/13/23   Abraham Abo, MD  rosuvastatin  (CRESTOR ) 10 MG tablet TAKE 1 TABLET (10 MG TOTAL) BY MOUTH DAILY FOR CHOLESTEROL Patient taking differently: Take 10 mg by mouth in the morning. 08/07/22   Abraham Abo, MD  senna-docusate (SENOKOT-S) 8.6-50 MG tablet Take 1 tablet by mouth 2 (two) times daily between meals as needed for mild constipation. 08/14/22   Gonfa, Taye T, MD  traZODone  (DESYREL ) 50 MG tablet Take 25-50 mg by mouth at bedtime as needed for sleep. 03/19/23   [provider]     Vitals:   10/28/23 0607 10/28/23 0630 10/28/23 0645 10/28/23 0650  BP: (!) 238/133 (!) 218/132 (!) 244/178 (!) 237/159  Pulse: 89 85 (!) 125 (!) 117  Resp: (!) 24 (!) 30 (!) 35 19  SpO2: 100% 100% (!) 88% 100%   Exam Gen awake, responsive, NRB mask, visibly SOB w/ ^wob No rash, cyanosis or gangrene Sclera anicteric, throat clear  No jvd or bruits Chest scattered bilat LL fine crackles RRR tachy, no RG Abd soft ntnd no mass or ascites +bs GU deferred MS no joint effusions or deformity Ext no sig LE or UE edema, no other edema Neuro is alert, Ox 3 , nf          Renal-related home meds: Norvasc  10 every day Coreg  25 bid Clonidine  0.1mg   Hydralazine  100 mg tid Lokelma  10gm mwf     OP HD: AF TTS  From April 2025 -> 3.5h  B400  46kg   2k bath RIJ TDC  Heparin  none    Assessment/ Plan: Resp distress: w/ severely uncont HTN,  bilat IS pulm edema on CXR. Similar presentation in April. BP is still very high and pt still  appears unstable from a resp standpoint. Refusing bipap which is unfortunate. Too unstable for HD upstairs. Will give IV hydralazine . Have d/w ED MD.  ESRD: outpatient HD TTS.  HTN: as above, ordering some prn IV hydralazine .  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  Secondary hyperparathyroidism: Ca in range, add phos/ alb.    Larry Poag  MD [facility] 10/28/2023, 7:35 AM  Recent Labs  Lab 10/28/23 0620  HGB 11.9*  CREATININE 7.90*  K 5.1   Inpatient medications:   nitroGLYCERIN  25 mcg/min (10/28/23 0701)

## 2023-10-28 NOTE — Progress Notes (Signed)
 Patient was seen for tachyarrhythmia and acute-onset severe headache.   On returning from dialysis, patient was noted to have HR in 150s.   By the time EKG could be obtained, she was in SR with HR in 90s but had developed acute-onset of severe frontal headache.   She has chronic right-sided deficits from prior CVA. No acute deficits identified.   Plan to treat headache and check head CT, use IV Lopressor  as-needed for rapid a fib.

## 2023-10-28 NOTE — Significant Event (Addendum)
 Rapid Response Event Note   Reason for Call :  Tachycardia-160s, SOB, and headache. Pt had just arrived to 5W from HD where they pulled off 2L. Per RN, pt HR increased to 160s with frequent ectopy and pt began c/o SOB and a 10/10 headache. Pt then sat up in the bed, began gasping, yelled that her head and L chest hurt, and fell back in bed.  Pt was placed on oxygen  and RNs called RRT and began to do EKG..   Initial Focused Assessment:  Pt lying in bed with eyes open. She is c/o 10/10 headache. She said she was SOB but that has resolved. She is alert and oriented, moving all extremities spontaneously and will follow commands. Lungs clear t/o, decreased in the bases. Skin warm and dry.   HR-100, BP-190/101, RR-28, SpO2-96% on [place] 3L  EKG done showing SR with 1st degree AV block with PACs and with aberrant conduction, R BBB.  Once EKG done, pt resting but will still c/o 10/10 headache if you ask.  Interventions:  EKG- SR with 1st degree AV block with PACs and with aberrant conduction, R BBB Lopressor  5mg  IV q2hprn for AF with HR >115 sustained Dilaudid  0.5mg  x 1 Tigan  200mg  IM q6h N/V CT Head STAT Plan of Care:  Pt currently resting in bed. Dilaudid  and tigan  ordered. Obtain CT head and await results. Continue to monitor pt closely. Please call RRT if further assistance needed.   Event Summary:   MD Notified: Dr. Brice Campi notified by bedside RN and came to bedside.  Call Time:2039 Arrival Time:2041 End Time:  Monique Gaudy, RN

## 2023-10-28 NOTE — Progress Notes (Signed)
 Pt receives out-pt HD at [facility] on TTS 12:05 chair time. Will assist as needed.   Lauraine Polite Renal Navigator [phone]

## 2023-10-28 NOTE — Assessment & Plan Note (Signed)
 Hgb stable, no bleeding reported or osberved

## 2023-10-28 NOTE — Hospital Course (Addendum)
 73 y.o. F with ESRD on HD TThS, frequent admissions for fluids overload (10th admission since Jan), hx CVA with right hemiparesis, pAF on Eliquis , active smoker, sCHF EF 45% who presented with SOB.  Most recent admission for fluid overload was 2 weeks ago and she left against medical advice.    Today called EMS complaining of having trouble breathing.  EMS found her hypoxic to 85%, refusing CPAP, medications with EMS.  Arrived with BP 240/140, edema on CXR.  Started on nitroglycerin  infusion and BP improved and mentation improved.  Nephrology consulted.    To me she reported adherence to all medicines, not having missed dialysis that she can remember, and strictly avoiding all illicit drugs.  CCM consulted initially, recommended continued nitroglycerin  drip, resumption of home oral medications, and admission to medicine service.

## 2023-10-28 NOTE — ED Provider Notes (Signed)
 [place] [facility] AT [facility] Provider Note   CSN: [id number] Arrival date & time: 10/28/23  7829     History  Chief Complaint  Patient presents with   Shortness of Breath   Level 5 caveat due to acuity of condition Monique Henderson is a 73 y.o. female.  The history is provided by the patient and the EMS personnel.   Patient with history of end-stage renal disease presents with acute onset shortness of breath.  Patient reports she woke up and had a hard time breathing.  EMS was called and she was noted to be 85% on room air, and refused noninvasive ventilation or IV.  Nonrebreather was applied with some improvement.  Patient reports that she has not missed any recent dialysis sessions.  She is due for dialysis later today    Home Medications Prior to Admission medications   Medication Sig Start Date End Date Taking? Authorizing Provider  acetaminophen  (TYLENOL ) 325 MG tablet Take 650 mg by mouth every 6 (six) hours as needed for mild pain (pain score 1-3) or moderate pain (pain score 4-6).    [provider]  albuterol  (PROVENTIL ) (2.5 MG/3ML) 0.083% nebulizer solution Take 3 mLs (2.5 mg total) by nebulization every 6 (six) hours as needed for wheezing or shortness of breath. 08/23/23   Pokhrel, Amador Bad, MD  albuterol  (VENTOLIN  HFA) 108 (90 Base) MCG/ACT inhaler Inhale 2 puffs into the lungs every 6 (six) hours as needed. 10/02/23   [provider]  amLODipine  (NORVASC ) 10 MG tablet Take 1 tablet (10 mg total) by mouth daily. Patient taking differently: Take 10 mg by mouth in the morning. 03/06/22   Setzer, Sandra J, PA-C  apixaban  (ELIQUIS ) 2.5 MG TABS tablet Take 1 tablet (2.5 mg total) by mouth 2 (two) times daily. Patient taking differently: Take 2.5 mg by mouth 2 (two) times daily. Take 1 tablet by mouth daily in the morning and 1 tablet by mouth daily at night. 03/06/22   Setzer, Sandra J, PA-C  carvedilol  (COREG ) 25 MG tablet Take 1  tablet (25 mg total) by mouth 2 (two) times daily with a meal. Patient taking differently: Take 25 mg by mouth 2 (two) times daily with a meal. Take 1 tablet by mouth daily in the morning and 1 tablet by mouth daily in the evening. 05/01/22 06/16/24  Ghimire, Estil Heman, MD  Cholecalciferol  (VITAMIN D -3) 125 MCG (5000 UT) TABS Take 1 tablet by mouth in the morning.    [provider]  cinacalcet  (SENSIPAR ) 60 MG tablet Take 60 mg by mouth every dialysis (Tuesday, Thursday, and Saturday).    [provider]  cloNIDine  (CATAPRES ) 0.1 MG tablet Take 1 tablet (0.1 mg total) by mouth 2 (two) times daily. Do not take it before dialysis Patient taking differently: Take 0.1 mg by mouth 2 (two) times daily. Do not take it before dialysis. Take 1 tablet by mouth daily in the morning and 1 tablet by mouth daily in the evening. 09/02/23   Deforest Fast, MD  hydrALAZINE  (APRESOLINE ) 100 MG tablet Take 1 tablet (100 mg total) by mouth every 8 (eight) hours. Patient taking differently: Take 100 mg by mouth 3 (three) times daily. Take 1 tablet by mouth daily in the morning, 1 tablet by mouth daily at noon, and 1 tablet by mouth daily every night. 12/18/22   Lucendia Rusk, MD  isosorbide  mononitrate (IMDUR ) 30 MG 24 hr tablet TAKE 1 TABLET (30MG ) ON DIALYSIS DAYS AND 2  TABLETS (60 MG) ON NON DIALYSIS DAYS. 01/12/23   Lucendia Rusk, MD  LOKELMA  10 g PACK packet Take 10 g by mouth every Monday, Wednesday, and Friday. 07/30/23   [provider]  naloxone  (NARCAN ) nasal spray 4 mg/0.1 mL Place 1 spray into the nose as needed (opioid overdose). 02/19/22   [provider]  pantoprazole  (PROTONIX ) 40 MG tablet TAKE 1 TABLET (40 MG TOTAL) BY MOUTH 2 (TWO) TIMES DAILY. Patient taking differently: Take 40 mg by mouth 2 (two) times daily. Take 1 tablet by mouth daily in the morning and 1 tablet by mouth daily in the evening. 05/13/23   Abraham Abo, MD  rosuvastatin  (CRESTOR ) 10 MG  tablet TAKE 1 TABLET (10 MG TOTAL) BY MOUTH DAILY FOR CHOLESTEROL Patient taking differently: Take 10 mg by mouth in the morning. 08/07/22   Abraham Abo, MD  senna-docusate (SENOKOT-S) 8.6-50 MG tablet Take 1 tablet by mouth 2 (two) times daily between meals as needed for mild constipation. 08/14/22   Gonfa, Taye T, MD  traZODone  (DESYREL ) 50 MG tablet Take 25-50 mg by mouth at bedtime as needed for sleep. 03/19/23   [provider]      Allergies    Shrimp [shellfish allergy], Bactroban  [mupirocin ], Hydrocodone , Chlorhexidine  gluconate, Latex, Tylenol  [acetaminophen ], and Zestril  [lisinopril ]    Review of Systems   Review of Systems  Unable to perform ROS: Acuity of condition  Respiratory:  Positive for shortness of breath.   Cardiovascular:  Negative for chest pain.    Physical Exam Updated Vital Signs BP (!) 237/159   Pulse (!) 117   Resp 19   SpO2 100%  Physical Exam CONSTITUTIONAL: Ill-appearing HEAD: Normocephalic/atraumatic EYES: EOMI/PERRL ENMT: Mucous membranes moist NECK: supple no meningeal signs CV: S1/S2 noted, no murmurs/rubs/gallops noted LUNGS: Tachypnea, crackles bilaterally, nonrebreather ABDOMEN: soft, nontender NEURO: Pt is awake/alert/appropriate, moves all extremitiesx4.  No facial droop.   EXTREMITIES: pulses normal/equal, full ROM SKIN: warm, color normal  ED Results / Procedures / Treatments   Labs (all labs ordered are listed, but only abnormal results are displayed) Labs Reviewed  I-STAT CHEM 8, ED - Abnormal; Notable for the following components:      Result Value   BUN 33 (*)    Creatinine, Ser 7.90 (*)    Glucose, Bld 107 (*)    Calcium , Ion 1.00 (*)    Hemoglobin 11.9 (*)    HCT 35.0 (*)    All other components within normal limits    EKG EKG Interpretation Date/Time:  Thursday Oct 28 2023 06:19:50 EDT Ventricular Rate:  90 PR Interval:  161 QRS Duration:  143 QT Interval:  424 QTC Calculation: 519 R Axis:   40  Text  Interpretation: Sinus rhythm Biatrial enlargement Right bundle branch block No significant change since last tracing Confirmed by Eldon Greenland ([id number]) on 10/28/2023 6:26:43 AM  Radiology DG Chest Portable 1 View Result Date: 10/28/2023 CLINICAL DATA:  Shortness of breath. EXAM: PORTABLE CHEST 1 VIEW COMPARISON:  10/12/2023 FINDINGS: The cardio pericardial silhouette is enlarged. Interstitial markings are diffusely coarsened. Patchy airspace opacity at the right base is progressive in the interval, compatible with atelectasis or infiltrate. Right IJ central line tip overlies the right atrium. No pneumothorax or pleural effusion. Telemetry leads overlie the chest. IMPRESSION: Progressive patchy airspace opacity at the right base, compatible with atelectasis or infiltrate. Diffuse interstitial opacity may be chronic although interstitial edema not excluded. Electronically Signed   By: Donnal Fusi M.D.   On:  10/28/2023 06:40    Procedures .Critical Care  Performed by: Eldon Greenland, MD Authorized by: Eldon Greenland, MD   Critical care provider statement:    Critical care time (minutes):  75   Critical care start time:  10/28/2023 6:00 AM   Critical care end time:  10/28/2023 7:15 AM   Critical care time was exclusive of:  Separately billable procedures and treating other patients   Critical care was necessary to treat or prevent imminent or life-threatening deterioration of the following conditions:  Respiratory failure and renal failure   Critical care was time spent personally by me on the following activities:  Examination of patient, ordering and review of radiographic studies, ordering and review of laboratory studies, pulse oximetry, review of old charts, re-evaluation of patient's condition, ordering and performing treatments and interventions, development of treatment plan with patient or surrogate, evaluation of patient's response to treatment and discussions with consultants   I  assumed direction of critical care for this patient from another provider in my specialty: no     Care discussed with: admitting provider       Medications Ordered in ED Medications  nitroGLYCERIN  50 mg in dextrose  5 % 250 mL (0.2 mg/mL) infusion (25 mcg/min Intravenous Rate/Dose Change 10/28/23 0701)    ED Course/ Medical Decision Making/ A&P Clinical Course as of 10/28/23 0732  Thu Oct 28, 2023  0617 Patient with known history of end-stage renal disease presents with abrupt onset of shortness of breath.  Reports she is due for dialysis later today.  She is currently require oxygen , but appears to be improving and will defer noninvasive ventilation for now Will treat blood pressure, check x-ray labs and reassess [DW]  0652 Patient's respiratory status became abruptly worse.  BiPAP was attempted but patient started to vomit. I reviewed the chart, she has been DNR previously, but the most recent admission reports she was full code She is awake and alert but is in significant distress.  Significant hypertension is noted, suspect she is having flash pulmonary edema we will start IV nitroglycerin . If she has any significant worsening she will need to be intubated as she would not tolerate noninvasive ventilation [DW]  0653 Of note, I did speak to her sister Artemisa Bile and she is unaware of any DNR in place [DW]  [phone] Patient appears to be improving.  Her work of breathing is improved Nitro drip is infusing. Discussed with Dr. Myron Asters with nephrology who is aware of the patient and will arrange dialysis.  Will call for admission [DW]  0729 Seen by nephrology Dr. Zana Hesselbach.  Due to her hypertension, she would not be able to go dialysis at this time.  She will likely need ICU admit and BP management  I have consulted critical care who will see the patient [DW]  0729 Signed out to dr Nora Beal with dispo pending [DW]    Clinical Course User Index [DW] Eldon Greenland, MD                                  Medical Decision Making Amount and/or Complexity of Data Reviewed Radiology: ordered. ECG/medicine tests: ordered.  Risk Prescription drug management.   This patient presents to the ED for concern of shortness of breath, this involves an extensive number of treatment options, and is a complaint that carries with it a high risk of complications and morbidity.  The differential diagnosis includes but is  not limited to Acute coronary syndrome, pneumonia, acute pulmonary edema, pneumothorax, acute anemia, pulmonary embolism    Comorbidities that complicate the patient evaluation: Patient's presentation is complicated by their history of end-stage renal disease  Social Determinants of Health: Patient's multiple ER visits  increases the complexity of managing their presentation  Additional history obtained: Additional history obtained from EMS  Records reviewed previous admission documents  Lab Tests: I Ordered, and personally interpreted labs.  The pertinent results include: Renal failure  Imaging Studies ordered: I ordered imaging studies including X-ray chest  I independently visualized and interpreted imaging which showed pulmonary edema I agree with the radiologist interpretation  Cardiac Monitoring: The patient was maintained on a cardiac monitor.  I personally viewed and interpreted the cardiac monitor which showed an underlying rhythm of:  sinus rhythm  Medicines ordered and prescription drug management: I ordered medication including nitroglycerin  for hypertension Reevaluation of the patient after these medicines showed that the patient    stayed the same  Critical Interventions:   nitro drip, admission for dialysis  Consultations Obtained: Critical I requested consultation with the admitting physician critical care and consultant nephrology, and discussed  findings as well as pertinent plan - they recommend: Admit  Reevaluation: After the interventions noted  above, I reevaluated the patient and found that they have :stayed the same  Complexity of problems addressed: Patient's presentation is most consistent with  acute presentation with potential threat to life or bodily function  Disposition: After consideration of the diagnostic results and the patient's response to treatment,  I feel that the patent would benefit from admission  .           Final Clinical Impression(s) / ED Diagnoses Final diagnoses:  ESRD (end stage renal disease) (HCC)  Acute respiratory failure with hypoxia [facility])    Rx / DC Orders ED Discharge Orders     None         Eldon Greenland, MD 10/28/23 [phone]

## 2023-10-28 NOTE — Assessment & Plan Note (Signed)
 Consult dietitian

## 2023-10-28 NOTE — Assessment & Plan Note (Signed)
 In sinus rhythm here - Continue Coreg , apixaban 

## 2023-10-28 NOTE — ED Notes (Signed)
 Report to dialysis called.

## 2023-10-28 NOTE — ED Notes (Signed)
 Dialysis staff made aware patient had assigned bed, and they could send her to her inpatient room after dialysis.

## 2023-10-28 NOTE — ED Notes (Signed)
 Pt became diaphoretic and had increased work of breathing, DR. Wickline to bedside and bipap ordered. RT placed patient on bipap but pt unable to tolerate due to vomiting. nitroglycerin  started at 10mcg/min

## 2023-10-28 NOTE — ED Notes (Signed)
 Patient to dialysis.

## 2023-10-28 NOTE — Assessment & Plan Note (Signed)
-   Consult Nephrology 

## 2023-10-28 NOTE — ED Notes (Signed)
 MD made aware of patient's run of non-sustained v-tach, no current change in plan.

## 2023-10-28 NOTE — Progress Notes (Signed)
 Patient admitted to [facility]W. Patient placed on tele which was verified with MT and NT. Pt. BP 187/99 (121) which is consistent with admission diagnosis. Patient given PRN IV hydralazine  and scheduled PO coreg .

## 2023-10-28 NOTE — Consult Note (Signed)
 NAME:  Monique Henderson, MRN:  [id number], DOB:  1951-01-06, LOS: 0 ADMISSION DATE:  10/28/2023, CONSULTATION DATE: 10/28/2023 REFERRING MD: Emergency department physician, CHIEF COMPLAINT: Hypertensive crisis  History of Present Illness:  73 year old female who was recently left AGAINST MEDICAL ADVICE.  She presents today on 10/28/2023 with a hypertensive crisis 240/141 but none acute respiratory distress.  He is currently on nitroglycerin  drip.  Her antihypertensives would order p.o.  She will need to be admitted to the ICU due to the complexity of her care hopefully she can undergo dialysis today and avoid intubation. Pertinent  Medical History   Past Medical History:  Diagnosis Date   Acute cardioembolic stroke (HCC) 02/26/2022   AF (paroxysmal atrial fibrillation) (HCC) 05/29/2019   on Coumadin    Aortic atherosclerosis (HCC) 07/05/2019   Aortic dissection (HCC) 04/04/2019   s/p repair   Bone spur 2008   Right calcaneal foot spur   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 Lillette Reid; S/P re-excision of superior and inferior margins 10/27/2012.    Dissection of aorta (HCC) 04/03/2019   ESRD on hemodialysis (HCC) 05/29/2019   Essential hypertension 09/16/2006   GERD 09/16/2006   Hepatitis C    treated and RNA confirmed not detectable 01/2017   Insomnia 03/14/2015   Malnutrition of moderate degree 05/19/2019   Non compliance with medical treatment 12/04/2017   Normocytic anemia    With thrombocytosis    Osteoarthritis    Right ureteral stone 2002   S/P lumbar spinal fusion 01/18/2014   S/P lumbar decompressive laminectomy, fusion, and plating for lumbar spinal stenosis on 05/27/2009 by Dr. Isadora Mar.  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 Isadora Mar.     Stroke (cerebrum) (HCC)    Stroke (HCC) 02/23/2022   SVT (supraventricular tachycardia) (HCC) 08/28/2019   Tobacco use disorder 04/19/2009   Uterine fibroid    Wears dentures    top     Significant Hospital Events: Including procedures, antibiotic start and stop dates in addition to other pertinent events     Interim History / Subjective:  Presented to the emergency department with crisis  Objective    Blood pressure (!) 227/146, pulse (!) 101, resp. rate (!) 31, SpO2 100%.       No intake or output data in the 24 hours ending 10/28/23 0811 There were no vitals filed for this visit.  Examination: General: Elderly female currently on 100% nonrebreather in no acute distress at rest. HENT: No JVD or lymphadenopathy is appreciated Lungs: Decreased breath sounds at the bases O2 sats room Cardiovascular: Heart sounds are regular regular rate and rhythm, right hemodialysis catheter subclavian noted Abdomen: Soft nontender Extremities: Warm dry without edema Neuro: Somewhat strange affect at first she says she does not want to be intubated and she says she does save her life. GU: End-stage renal patient  Resolved problem list   Assessment and Plan  Acute on chronic hypertensive crisis with frequent admissions reportedly takes her medications but currently has a blood pressure of 240/141.  She is awake and alert currently on nitroglycerin  drip. Administer her home medications consisting of hydralazine , Imdur , Coreg   clonidine  Monitor blood pressure closely Wean nitroglycerin  as able Admit to intensive care unit until stabilized  Hypoxia currently on 100% nonrebreather with O2 sats of 100% Decrease amount of oxygen  as needed She does not need to be intubated at this time. Monitor pulmonary status closely intensive care unit  End-stage renal disease with nephrology at bedside normally dialyzed on Tuesdays Thursdays and Saturdays.  She is due for dialysis today 10/28/2023. Hemodialysis as tolerated She does have medical external catheter which could be utilized with CRRT if needed Potassium is within normal range at this time.  History of stroke and noncompliance she signed out [facility] 10/13/2023. Monitor in the care unit for now  Best Practice (right click and "Reselect all SmartList Selections" daily)   Diet/type: Regular consistency (see orders) DVT prophylaxis DOAC Pressure ulcer(s): N/A GI prophylaxis: PPI Lines: Central line Foley:  Yes, and it is still needed Code Status:  full code Last date of multidisciplinary goals of care discussion [tbd]  Labs   CBC: Recent Labs  Lab 10/28/23 0620  HGB 11.9*  HCT 35.0*    Basic Metabolic Panel: Recent Labs  Lab 10/28/23 0620  NA 138  K 5.1  CL 99  GLUCOSE 107*  BUN 33*  CREATININE 7.90*   GFR: CrCl cannot be calculated (Unknown ideal weight.). No results for input(s): "PROCALCITON", "WBC", "LATICACIDVEN" in the last 168 hours.  Liver Function Tests: No results for input(s): "AST", "ALT", "ALKPHOS", "BILITOT", "PROT", "ALBUMIN " in the last 168 hours. No results for input(s): "LIPASE", "AMYLASE" in the last 168 hours. No results for input(s): "AMMONIA" in the last 168 hours.  ABG    Component Value Date/Time   PHART 7.508 (H) 01/29/2022 1132   PCO2ART 38.0 01/29/2022 1132   PO2ART 61 (L) 01/29/2022 1132   HCO3 32.5 (H) 08/31/2023 1206   TCO2 28 10/28/2023 0620   ACIDBASEDEF 8.0 (H) 04/06/2019 1748   O2SAT 96.8 08/31/2023 1206      Coagulation Profile: No results for input(s): "INR", "PROTIME" in the last 168 hours.  Cardiac Enzymes: No results for input(s): "CKTOTAL", "CKMB", "CKMBINDEX", "TROPONINI" in the last 168 hours.  HbA1C: Hgb A1c MFr Bld  Date/Time Value Ref Range Status  09/01/2023 10:45 AM 4.1 (L) 4.8 - 5.6 % Final    Comment:    (NOTE) Pre diabetes:          5.7%-6.4%  Diabetes:              >6.4%  Glycemic control for   <7.0% adults with diabetes   02/23/2022 06:46 PM 4.7 (L) 4.8 - 5.6 % Final    Comment:    (NOTE) Pre diabetes:          5.7%-6.4%  Diabetes:              >6.4%  Glycemic control for   <7.0% adults with diabetes     CBG: No results for input(s): "GLUCAP" in the last 168 hours.  Review of Systems:   10 point review of system taken, please see HPI for positives and negatives.   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   Surgical History:   Past Surgical History:  Procedure Laterality Date   ANTERIOR LAT LUMBAR FUSION N/A 01/18/2014   Procedure: ANTERIOR LATERAL LUMBAR FUSION LUMBAR TWO-THREE;  Surgeon: Isadora Mar, MD;  Location: [facility];  Service: Neurosurgery;  Laterality: N/A;   ANTERIOR LUMBAR FUSION  01/18/2014   AV FISTULA PLACEMENT Left 04/20/2019   Procedure: ARTERIOVENOUS (AV) FISTULA CREATION;  Surgeon: Adine Hoof, MD;  Location: [facility] OR;  Service: Vascular;  Laterality: Left;   BACK SURGERY     BREAST LUMPECTOMY Left 01/2003   BREAST LUMPECTOMY Right 2014   BREAST LUMPECTOMY WITH NEEDLE LOCALIZATION AND AXILLARY SENTINEL LYMPH NODE BX Right 10/13/2012   Procedure: BREAST LUMPECTOMY WITH NEEDLE LOCALIZATION;  Surgeon: Mayme Spearman, MD;  Location: [facility];  Service: General;  Laterality: Right;  Right breast wire localized lumpectomy   DIALYSIS/PERMA CATHETER INSERTION N/A 05/28/2023   Procedure: DIALYSIS/PERMA CATHETER INSERTION;  Surgeon: Patrick Boor, MD;  Location: [facility];  Service: Cardiovascular;  Laterality: N/A;   INSERTION OF DIALYSIS CATHETER Right 04/20/2019   Procedure: INSERTION OF DIALYSIS CATHETER, right internal jugular;  Surgeon: Adine Hoof, MD;  Location: [facility] OR;  Service: Vascular;  Laterality: Right;   INSERTION OF DIALYSIS CATHETER Right 09/24/2020   Procedure: INSERTION OF TUNNELED DIALYSIS CATHETER;  Surgeon: Mayo Speck, MD;  Location: [facility] OR;  Service: Vascular;  Laterality: Right;   IR FLUORO GUIDE CV LINE RIGHT  09/22/2020   IR THORACENTESIS ASP PLEURAL SPACE W/IMG GUIDE  05/19/2019   IR US  GUIDE VASC ACCESS LEFT  09/22/2020   IR US  GUIDE VASC ACCESS RIGHT  09/22/2020   IR VENOCAVAGRAM SVC  09/22/2020   LAMINECTOMY  05/27/2009   Lumbar decompressive laminectomy, fusion and plating for lumbar spinal stensosis   LIGATION OF ARTERIOVENOUS  FISTULA Left 09/24/2020    Procedure: LIGATION OF LEFT ARM ARTERIOVENOUS  FISTULA;  Surgeon: Mayo Speck, MD;  Location: [facility] OR;  Service: Vascular;  Laterality: Left;   LUMBAR LAMINECTOMY/DECOMPRESSION MICRODISCECTOMY Left 03/23/2013   Procedure: LUMBAR LAMINECTOMY/DECOMPRESSION MICRODISCECTOMY 1 LEVEL;  Surgeon: Isadora Mar, MD;  Location: [facility];  Service: Neurosurgery;  Laterality: Left;  LUMBAR LAMINECTOMY/DECOMPRESSION MICRODISCECTOMY 1 LEVEL   MASTECTOMY, PARTIAL Left 02/26/2003   ; S/P re-excision of cranial and lateral margins 04/19/2003.    RE-EXCISION OF BREAST CANCER,SUPERIOR MARGINS Right 10/27/2012   Procedure: RE-EXCISION OF BREAST CANCER,SUPERIOR and inferior MARGINS;  Surgeon: Mayme Spearman, MD;  Location: [facility] OR;  Service: General;  Laterality: Right;   RE-EXCISION OF BREAST LUMPECTOMY Left 04/2003   TEE WITHOUT CARDIOVERSION N/A 04/04/2019   Procedure: Transesophageal Echocardiogram (Tee);  Surgeon: Rudine Cos, MD;  Location: [facility] OR;  Service: Open Heart Surgery;  Laterality: N/A;   THORACIC AORTIC  ANEURYSM REPAIR N/A 04/04/2019   Procedure: THORACIC ASCENDING ANEURYSM REPAIR (AAA)  USING 28 MM X 30 CM HEMASHIELD PLATINUM VASCULAR GRAFT;  Surgeon: Rudine Cos, MD;  Location: [facility] OR;  Service: Open Heart Surgery;  Laterality: N/A;     Social History:   reports that she quit smoking about 3 years ago. Her smoking use included cigarettes. She started smoking about 47 years ago. She has a 11 pack-year smoking history. She has never used smokeless tobacco. She reports that she does not currently use alcohol  after a past usage of about 2.0 standard drinks of alcohol  per week. She reports that she does not currently use drugs after having used the following drugs: Cocaine .   Family History:  Her family history includes Bone cancer (age of onset: 34) in her sister; Colon cancer (age of onset: 14) in her mother; Diabetes in her brother, brother, and son; Diabetes (age of onset: 45) in her sister;  Hypertension in her brother, brother, mother, sister, and son; Kidney disease in her son; Multiple sclerosis in her son. There is no history of Breast cancer or Cervical cancer.   Allergies Allergies  Allergen Reactions   Shrimp [Shellfish Allergy] Shortness Of Breath   Bactroban  [Mupirocin ] Other (See Comments)    "Sores in nose"   Hydrocodone  Itching, Nausea And Vomiting and Nausea Only   Chlorhexidine  Gluconate Itching   Latex Itching    Latex gloves cause itchiness and a rash   Tylenol  [Acetaminophen ] Itching    Takes Percocet at home 05/06/23   Zestril  [Lisinopril ] Cough     Home Medications  Prior to Admission medications   Medication Sig Start Date End Date Taking? Authorizing Provider  acetaminophen  (TYLENOL ) 325 MG tablet Take 650 mg by mouth every 6 (six) hours as needed for mild pain (pain score 1-3) or moderate pain (pain score 4-6).    [provider]  albuterol  (PROVENTIL ) (2.5 MG/3ML) 0.083% nebulizer solution Take 3 mLs (2.5 mg total) by nebulization every 6 (six) hours as needed for wheezing or shortness of breath. 08/23/23   Pokhrel, Amador Bad, MD  albuterol  (VENTOLIN  HFA) 108 (90 Base) MCG/ACT inhaler Inhale 2 puffs into the lungs every 6 (six) hours as needed. 10/02/23   [provider]  amLODipine  (NORVASC ) 10 MG tablet Take 1 tablet (10 mg total) by mouth daily. Patient taking differently: Take 10 mg by mouth in the morning. 03/06/22   Setzer, Sandra J, PA-C  apixaban  (ELIQUIS ) 2.5 MG TABS tablet Take 1 tablet (2.5 mg total) by mouth 2 (two) times daily. Patient taking differently: Take 2.5 mg by mouth 2 (two) times daily. Take 1 tablet by mouth daily in the morning and 1 tablet by mouth daily at night. 03/06/22   Setzer, Sandra J, PA-C  carvedilol  (COREG ) 25 MG tablet Take 1 tablet (25 mg total) by mouth 2 (two) times daily with a meal. Patient taking differently: Take 25 mg by mouth 2 (two) times daily with a meal. Take 1 tablet by mouth daily in the  morning and 1 tablet by mouth daily in the evening. 05/01/22 06/16/24  Ghimire, Estil Heman, MD  Cholecalciferol  (VITAMIN D -3) 125 MCG (5000 UT) TABS Take 1 tablet by mouth in the morning.    [provider]  cinacalcet  (SENSIPAR ) 60 MG tablet Take 60 mg by mouth every dialysis (Tuesday, Thursday, and Saturday).    [provider]  cloNIDine  (CATAPRES ) 0.1 MG tablet Take 1 tablet (0.1 mg total) by mouth 2 (two) times daily.  Do not take it before dialysis Patient taking differently: Take 0.1 mg by mouth 2 (two) times daily. Do not take it before dialysis. Take 1 tablet by mouth daily in the morning and 1 tablet by mouth daily in the evening. 09/02/23   Deforest Fast, MD  hydrALAZINE  (APRESOLINE ) 100 MG tablet Take 1 tablet (100 mg total) by mouth every 8 (eight) hours. Patient taking differently: Take 100 mg by mouth 3 (three) times daily. Take 1 tablet by mouth daily in the morning, 1 tablet by mouth daily at noon, and 1 tablet by mouth daily every night. 12/18/22   Lucendia Rusk, MD  isosorbide  mononitrate (IMDUR ) 30 MG 24 hr tablet TAKE 1 TABLET (30MG ) ON DIALYSIS DAYS AND 2 TABLETS (60 MG) ON NON DIALYSIS DAYS. 01/12/23   Lucendia Rusk, MD  LOKELMA  10 g PACK packet Take 10 g by mouth every Monday, Wednesday, and Friday. 07/30/23   [provider]  naloxone  (NARCAN ) nasal spray 4 mg/0.1 mL Place 1 spray into the nose as needed (opioid overdose). 02/19/22   [provider]  pantoprazole  (PROTONIX ) 40 MG tablet TAKE 1 TABLET (40 MG TOTAL) BY MOUTH 2 (TWO) TIMES DAILY. Patient taking differently: Take 40 mg by mouth 2 (two) times daily. Take 1 tablet by mouth daily in the morning and 1 tablet by mouth daily in the evening. 05/13/23   Abraham Abo, MD  rosuvastatin  (CRESTOR ) 10 MG tablet TAKE 1 TABLET (10 MG TOTAL) BY MOUTH DAILY FOR CHOLESTEROL Patient taking differently: Take 10 mg by mouth in the morning. 08/07/22   Abraham Abo, MD  senna-docusate  (SENOKOT-S) 8.6-50 MG tablet Take 1 tablet by mouth 2 (two) times daily between meals as needed for mild constipation. 08/14/22   Gonfa, Taye T, MD  traZODone  (DESYREL ) 50 MG tablet Take 25-50 mg by mouth at bedtime as needed for sleep. 03/19/23   [provider]     Critical care time: 35 min    Siegfried Dress Misha Antonini ACNP Acute Care Nurse Practitioner [facility] Pulmonary/Critical Care Please consult Amion 10/28/2023, 8:11 AM

## 2023-10-28 NOTE — Assessment & Plan Note (Signed)
-   Consult Nephrology for HD - Defer Lokelma  for now

## 2023-10-28 NOTE — Consult Note (Addendum)
 Renal Service Consult Note [facility]  DELISHA PEADEN 10/28/2023 Lynae Sandifer, MD Requesting Physician: Dr. Nora Beal  Reason for Consult: ESRD pt w/ resp distress HPI: The patient is a 73 y.o. year-old w/ PMH as below who presented to ED c/o SOB acute onset this am. 85% on RA per EMS, refused CPAP, IV or meds per EMS. In ED BP 237/159, HR 117, RR 19- 37. K+ 5.1, creat 7.90. CXR bilat IS edema, CM. Pt was started on IV NTG drip which has been titrated. We are asked to see for dialysis.    Pt seen in ED room. Pt is on an NRB mask, vague historian. She is visibly SOB. BP 230/160.    ROS - denies CP, no joint pain, no HA, no blurry vision, no rash, no diarrhea, no nausea/ vomiting  PMH: PAF Stroke Aoritc dissection Breast cancer HFrEF ESRD on HD HTN Hep C OA  Past Surgical History  Past Surgical History:  Procedure Laterality Date   ANTERIOR LAT LUMBAR FUSION N/A 01/18/2014   Procedure: ANTERIOR LATERAL LUMBAR FUSION LUMBAR TWO-THREE;  Surgeon: Isadora Mar, MD;  Location: [facility];  Service: Neurosurgery;  Laterality: N/A;   ANTERIOR LUMBAR FUSION  01/18/2014   AV FISTULA PLACEMENT Left 04/20/2019   Procedure: ARTERIOVENOUS (AV) FISTULA CREATION;  Surgeon: Adine Hoof, MD;  Location: [facility] OR;  Service: Vascular;  Laterality: Left;   BACK SURGERY     BREAST LUMPECTOMY Left 01/2003   BREAST LUMPECTOMY Right 2014   BREAST LUMPECTOMY WITH NEEDLE LOCALIZATION AND AXILLARY SENTINEL LYMPH NODE BX Right 10/13/2012   Procedure: BREAST LUMPECTOMY WITH NEEDLE LOCALIZATION;  Surgeon: Mayme Spearman, MD;  Location: [facility];  Service: General;  Laterality: Right;  Right breast wire localized lumpectomy   DIALYSIS/PERMA CATHETER INSERTION N/A 05/28/2023   Procedure: DIALYSIS/PERMA CATHETER INSERTION;  Surgeon: Patrick Boor, MD;  Location: [facility];  Service: Cardiovascular;  Laterality: N/A;   INSERTION OF DIALYSIS CATHETER  Right 04/20/2019   Procedure: INSERTION OF DIALYSIS CATHETER, right internal jugular;  Surgeon: Adine Hoof, MD;  Location: [facility] OR;  Service: Vascular;  Laterality: Right;   INSERTION OF DIALYSIS CATHETER Right 09/24/2020   Procedure: INSERTION OF TUNNELED DIALYSIS CATHETER;  Surgeon: Mayo Speck, MD;  Location: [facility] OR;  Service: Vascular;  Laterality: Right;   IR FLUORO GUIDE CV LINE RIGHT  09/22/2020   IR THORACENTESIS ASP PLEURAL SPACE W/IMG GUIDE  05/19/2019   IR US  GUIDE VASC ACCESS LEFT  09/22/2020   IR US  GUIDE VASC ACCESS RIGHT  09/22/2020   IR VENOCAVAGRAM SVC  09/22/2020   LAMINECTOMY  05/27/2009   Lumbar decompressive laminectomy, fusion and plating for lumbar spinal stensosis   LIGATION OF ARTERIOVENOUS  FISTULA Left 09/24/2020   Procedure: LIGATION OF LEFT ARM ARTERIOVENOUS  FISTULA;  Surgeon: Mayo Speck, MD;  Location: [facility] OR;  Service: Vascular;  Laterality: Left;   LUMBAR LAMINECTOMY/DECOMPRESSION MICRODISCECTOMY Left 03/23/2013   Procedure: LUMBAR LAMINECTOMY/DECOMPRESSION MICRODISCECTOMY 1 LEVEL;  Surgeon: Isadora Mar, MD;  Location: [facility];  Service: Neurosurgery;  Laterality: Left;  LUMBAR LAMINECTOMY/DECOMPRESSION MICRODISCECTOMY 1 LEVEL   MASTECTOMY, PARTIAL Left 02/26/2003   ; S/P re-excision of cranial and lateral margins 04/19/2003.    RE-EXCISION OF BREAST CANCER,SUPERIOR MARGINS Right 10/27/2012   Procedure: RE-EXCISION OF BREAST CANCER,SUPERIOR and inferior MARGINS;  Surgeon: Mayme Spearman, MD;  Location: [facility];  Service: General;  Laterality: Right;  RE-EXCISION OF BREAST LUMPECTOMY Left 04/2003   TEE WITHOUT CARDIOVERSION N/A 04/04/2019   Procedure: Transesophageal Echocardiogram (Tee);  Surgeon: Rudine Cos, MD;  Location: [facility] OR;  Service: Open Heart Surgery;  Laterality: N/A;   THORACIC AORTIC ANEURYSM REPAIR N/A 04/04/2019   Procedure: THORACIC ASCENDING ANEURYSM REPAIR (AAA)  USING 28 MM X 30 CM HEMASHIELD PLATINUM VASCULAR GRAFT;   Surgeon: Rudine Cos, MD;  Location: [facility] OR;  Service: Open Heart Surgery;  Laterality: N/A;   Family History  Family History  Problem Relation Age of Onset   Colon cancer Mother 37   Hypertension Mother    Diabetes Sister 31   Hypertension Sister    Diabetes Brother    Hypertension Brother    Diabetes Brother    Hypertension Brother    Kidney disease Son        s/p renal transplant   Hypertension Son    Diabetes Son    Multiple sclerosis Son    Bone cancer Sister 20   Breast cancer Neg Hx    Cervical cancer Neg Hx    Social History  reports that she quit smoking about 3 years ago. Her smoking use included cigarettes. She started smoking about 47 years ago. She has a 11 pack-year smoking history. She has never used smokeless tobacco. She reports that she does not currently use alcohol  after a past usage of about 2.0 standard drinks of alcohol  per week. She reports that she does not currently use drugs after having used the following drugs: Cocaine . Allergies  Allergies  Allergen Reactions   Shrimp [Shellfish Allergy] Shortness Of Breath   Bactroban  [Mupirocin ] Other (See Comments)    "Sores in nose"   Hydrocodone  Itching, Nausea And Vomiting and Nausea Only   Chlorhexidine  Gluconate Itching   Latex Itching    Latex gloves cause itchiness and a rash   Tylenol  [Acetaminophen ] Itching    Takes Percocet at home 05/06/23   Zestril  [Lisinopril ] Cough   Home medications Prior to Admission medications   Medication Sig Start Date End Date Taking? Authorizing Provider  acetaminophen  (TYLENOL ) 325 MG tablet Take 650 mg by mouth every 6 (six) hours as needed for mild pain (pain score 1-3) or moderate pain (pain score 4-6).    [provider]  albuterol  (PROVENTIL ) (2.5 MG/3ML) 0.083% nebulizer solution Take 3 mLs (2.5 mg total) by nebulization every 6 (six) hours as needed for wheezing or shortness of breath. 08/23/23   Pokhrel, Amador Bad, MD  albuterol  (VENTOLIN  HFA) 108 (90  Base) MCG/ACT inhaler Inhale 2 puffs into the lungs every 6 (six) hours as needed. 10/02/23   [provider]  amLODipine  (NORVASC ) 10 MG tablet Take 1 tablet (10 mg total) by mouth daily. Patient taking differently: Take 10 mg by mouth in the morning. 03/06/22   Setzer, Sandra J, PA-C  apixaban  (ELIQUIS ) 2.5 MG TABS tablet Take 1 tablet (2.5 mg total) by mouth 2 (two) times daily. Patient taking differently: Take 2.5 mg by mouth 2 (two) times daily. Take 1 tablet by mouth daily in the morning and 1 tablet by mouth daily at night. 03/06/22   Setzer, Sandra J, PA-C  carvedilol  (COREG ) 25 MG tablet Take 1 tablet (25 mg total) by mouth 2 (two) times daily with a meal. Patient taking differently: Take 25 mg by mouth 2 (two) times daily with a meal. Take 1 tablet by mouth daily in the morning and 1 tablet by mouth daily in the evening. 05/01/22 06/16/24  Ghimire, Estil Heman, MD  Cholecalciferol  (VITAMIN D -3) 125 MCG (5000 UT) TABS Take 1 tablet by mouth in the morning.    [provider]  cinacalcet  (SENSIPAR ) 60 MG tablet Take 60 mg by mouth every dialysis (Tuesday, Thursday, and Saturday).    [provider]  cloNIDine  (CATAPRES ) 0.1 MG tablet Take 1 tablet (0.1 mg total) by mouth 2 (two) times daily. Do not take it before dialysis Patient taking differently: Take 0.1 mg by mouth 2 (two) times daily. Do not take it before dialysis. Take 1 tablet by mouth daily in the morning and 1 tablet by mouth daily in the evening. 09/02/23   Deforest Fast, MD  hydrALAZINE  (APRESOLINE ) 100 MG tablet Take 1 tablet (100 mg total) by mouth every 8 (eight) hours. Patient taking differently: Take 100 mg by mouth 3 (three) times daily. Take 1 tablet by mouth daily in the morning, 1 tablet by mouth daily at noon, and 1 tablet by mouth daily every night. 12/18/22   Lucendia Rusk, MD  isosorbide  mononitrate (IMDUR ) 30 MG 24 hr tablet TAKE 1 TABLET (30MG ) ON DIALYSIS DAYS AND 2 TABLETS (60 MG) ON NON  DIALYSIS DAYS. 01/12/23   Lucendia Rusk, MD  LOKELMA  10 g PACK packet Take 10 g by mouth every Monday, Wednesday, and Friday. 07/30/23   [provider]  naloxone  (NARCAN ) nasal spray 4 mg/0.1 mL Place 1 spray into the nose as needed (opioid overdose). 02/19/22   [provider]  pantoprazole  (PROTONIX ) 40 MG tablet TAKE 1 TABLET (40 MG TOTAL) BY MOUTH 2 (TWO) TIMES DAILY. Patient taking differently: Take 40 mg by mouth 2 (two) times daily. Take 1 tablet by mouth daily in the morning and 1 tablet by mouth daily in the evening. 05/13/23   Abraham Abo, MD  rosuvastatin  (CRESTOR ) 10 MG tablet TAKE 1 TABLET (10 MG TOTAL) BY MOUTH DAILY FOR CHOLESTEROL Patient taking differently: Take 10 mg by mouth in the morning. 08/07/22   Abraham Abo, MD  senna-docusate (SENOKOT-S) 8.6-50 MG tablet Take 1 tablet by mouth 2 (two) times daily between meals as needed for mild constipation. 08/14/22   Gonfa, Taye T, MD  traZODone  (DESYREL ) 50 MG tablet Take 25-50 mg by mouth at bedtime as needed for sleep. 03/19/23   [provider]     Vitals:   10/28/23 0607 10/28/23 0630 10/28/23 0645 10/28/23 0650  BP: (!) 238/133 (!) 218/132 (!) 244/178 (!) 237/159  Pulse: 89 85 (!) 125 (!) 117  Resp: (!) 24 (!) 30 (!) 35 19  SpO2: 100% 100% (!) 88% 100%   Exam Gen awake, responsive, NRB mask, visibly SOB w/ ^wob No rash, cyanosis or gangrene Sclera anicteric, throat clear  No jvd or bruits Chest scattered bilat LL fine crackles RRR tachy, no RG Abd soft ntnd no mass or ascites +bs GU deferred MS no joint effusions or deformity Ext no sig LE or UE edema, no other edema Neuro is alert, Ox 3 , nf          Renal-related home meds: Norvasc  10 every day Coreg  25 bid Clonidine  0.1mg   Hydralazine  100 mg tid Lokelma  10gm mwf     OP HD: AF TTS  From April 2025 -> 3.5h  B400  46kg   2k bath RIJ TDC  Heparin  none    Assessment/ Plan: Resp distress: w/ severely uncont HTN,  bilat IS pulm edema on CXR. Similar presentation in April. BP is still very high and pt appears  unstable from a resp standpoint. Refusing bipap which is unfortunate. Too unstable for HD upstairs. Will give IV hydralazine . Have d/w ED MD.  ESRD: outpatient HD TTS.  HTN: as above, ordering some prn IV hydralazine .  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  Secondary hyperparathyroidism: Ca in range, add phos/ alb.    Larry Poag  MD [facility] 10/28/2023, 7:35 AM  Recent Labs  Lab 10/28/23 0620  HGB 11.9*  CREATININE 7.90*  K 5.1   Inpatient medications:   nitroGLYCERIN  25 mcg/min (10/28/23 0701)

## 2023-10-28 NOTE — Assessment & Plan Note (Signed)
Denies

## 2023-10-29 DIAGNOSIS — I169 Hypertensive crisis, unspecified: Secondary | ICD-10-CM | POA: Diagnosis not present

## 2023-10-29 LAB — BASIC METABOLIC PANEL WITH GFR
Anion gap: 11 (ref 5–15)
BUN: 15 mg/dL (ref 8–23)
CO2: 30 mmol/L (ref 22–32)
Calcium: 9 mg/dL (ref 8.9–10.3)
Chloride: 97 mmol/L — ABNORMAL LOW (ref 98–111)
Creatinine, Ser: 4.85 mg/dL — ABNORMAL HIGH (ref 0.44–1.00)
GFR, Estimated: 9 mL/min — ABNORMAL LOW (ref >60)
Glucose, Bld: 94 mg/dL (ref 70–99)
Potassium: 4.2 mmol/L (ref 3.5–5.1)
Sodium: 138 mmol/L (ref 135–145)

## 2023-10-29 LAB — CBC
HCT: 28.4 % — ABNORMAL LOW (ref 36.0–46.0)
Hemoglobin: 8.9 g/dL — ABNORMAL LOW (ref 12.0–15.0)
MCH: 29.3 pg (ref 26.0–34.0)
MCHC: 31.3 g/dL (ref 30.0–36.0)
MCV: 93.4 fL (ref 80.0–100.0)
Platelets: 260 10*3/uL (ref 150–400)
RBC: 3.04 MIL/uL — ABNORMAL LOW (ref 3.87–5.11)
RDW: 20.9 % — ABNORMAL HIGH (ref 11.5–15.5)
WBC: 4.6 10*3/uL (ref 4.0–10.5)
nRBC: 0 % (ref 0.0–0.2)

## 2023-10-29 LAB — MRSA NEXT GEN BY PCR, NASAL: MRSA by PCR Next Gen: NOT DETECTED

## 2023-10-29 MED ORDER — RENA-VITE PO TABS
1.0000 | ORAL_TABLET | Freq: Every day | ORAL | Status: DC
  Administered 2023-10-29: 1 via ORAL
  Filled 2023-10-29 (×2): qty 1

## 2023-10-29 MED ORDER — HYDRALAZINE HCL 20 MG/ML IJ SOLN
10.0000 mg | INTRAMUSCULAR | Status: DC | PRN
  Administered 2023-10-29 – 2023-10-30 (×5): 10 mg via INTRAVENOUS
  Filled 2023-10-29: qty 1
  Filled 2023-10-29: qty 0.5
  Filled 2023-10-29 (×3): qty 1

## 2023-10-29 NOTE — Discharge Instructions (Signed)

## 2023-10-29 NOTE — Progress Notes (Addendum)
 On 10/28/2023   @ 1945 - BP 187/99. PRN IV hydralazine  given   @ 1946 - Scheduled 25 mg PO coreg  given (was due @ 1700 but pt. was still at dialysis)  @ 2001 - 167/92, HR 108 (Going between NSR, ST, and A-Flutter)  @2130  - Scheduled 100 mg PO hydralazine  given  @ 2039 - 182/113, HR 150 A-Flutter, resp. 28, 90% on RA.Aaron Aas While R.N. Bri [place] and R.N. Heriberto [place] at bedside, patient sat up in bed, gasped, and looked panicked. R.N. asked patient what was wrong and patient answered that she had a 10/10 headache. Pt's speech was more garbled than baseline and pt. Stated that she now had chest pain. (Patient had had dialysis today with 2L removed, and had arrived on 5W from dialysis at approx. 1950. Of note, pt. had been in NSR at dialysis (per report),  but had been switching back and forth on 5W). R.N. messaged Dr. Brice Campi. RRT also called. Pt. Placed on 3L O2.  @2041  - RRT R.N. @ bedside   @ 2042 - 190/101, HR 110 Aflutter  @ 2045 - Dr. Brice Campi at bedside, gave orders for PRN one-time dose of 0.5 mg IV dilaudid  for pain, Tigan  200 mg IM q6h PRN for N/V, Lopressor  5mg  IV q2hprn for AF with HR >115 sustained, CT Head STAT, and EKG. EKG = SR with 1st degree AV block with PACs and with aberrant conduction, R BBB (Dr. Brice Campi reviewed this at bedside)  @ 2153 - Head CT performed with R.N. present  @ 2255 - Head CT results back: No acute abnormality  @ 2300 - 179/99, HR 96 (Going between NSR, ST, and A-Flutter)  @ 2343 - R.N. concerned about giving PRN hydralazine  again, as it would be patient's 3 dose of hydralazine  since 1900. R.N. messaged Dr. Brice Campi and said: "Patient BP 179/99. I've already given her one dose of IV hydralazine  and one dose of PO hydralazine . Do you want me to give her another dose of hydralazine  or try something else? I also spoke with telemetry and they said they are having a hard time interpreting patient's rhythm as it appears to be an aflutter but the complexes keep changing. Do  you think we could consult cardiology?" (Patient had also had scheduled PO dose of coreg , which R.N. had told Dr. Brice Campi when he was at bedside @ 2045).  @ 2349 -  Dr. Brice Campi replied and said: "I wouldn't give anything else for that BP. I also wouldn't worry about what rhythm she is in. We know she goes in and out of a fib and atrial flutter."  @ 2350 - R.N. messaged Dr. Brice Campi back and said: "If you're ok with her SBP being in the 170's, at what point would you want me to notify you? 180's? 200's?"   @2351  - Dr. Brice Campi replied via secure chat: "Let me know if SBP is sustaining >180 despite the available PRN medication".      On 10/29/2023   @ 0000 - 163/93, HR 91. Pt. sleeping comfortably.   @ 0033 - Tele called to say pt. Back in NSR in the 90's

## 2023-10-29 NOTE — Progress Notes (Signed)
 PROGRESS NOTE    Monique Henderson  WUJ:[id number] DOB: [birth year] DOA: 10/28/2023 PCP: Charle Congo, MD    Chief Complaint  Patient presents with   Shortness of Breath    Brief Narrative:   73 y.o. F with ESRD on HD TThS, frequent admissions for fluids overload (10th admission since Jan), hx CVA with right hemiparesis, pAF on Eliquis , active smoker, sCHF EF 45% who presented with SOB.  With multiple admissions in the past, this is her 10th admission this year, was recently 2 weeks ago presents with volume overload, she left AGAINST MEDICAL ADVICE she presents again with shortness of breath, workup significant for hypertensive crisis, likely setting of noncompliant with medication.   Assessment & Plan:   Principal Problem:   Hypertensive crisis Active Problems:   Fluid overload   Hyperkalemia   Acute on chronic HFrEF (heart failure with reduced ejection fraction) (HCC)   Acute hypoxemic respiratory failure (HCC)   Non compliance with medical treatment   History of CVA (cerebrovascular accident)   Paroxysmal atrial fibrillation (HCC)   Polysubstance abuse (HCC)   Hemiparesis affecting right side as late effect of cerebrovascular accident (CVA) (HCC)   Tobacco use disorder   Malnutrition of moderate degree   ESRD (end stage renal disease) (HCC)   History of anemia due to CKD   Hypertensive crisis Hypertensive emergency -Presetned with BP 240/140 as well as acute CHF and likely acute metabolic encephalopathy . - Started on nitroglycerin  drip, weaned off drip in ER, currently transition to her home regimen with as needed medications, overall blood pressure much improved especially after receiving dialysis today . - Really doubt she has been compliant with her medications, as blood pressure much improved now she is getting her home regimen here in the hospital . - Sugar should further improve with dialysis, so we will hold on getting it down so fast     Fluid overload Acute  on chronic systolic heart failure -Significant evidence of volume overload, volume management with dialysis, she will be dialyzed daily as discussed with renal till her volume status is optimized    Acute hypoxemic respiratory failure (HCC) Presented with respiratory distress, was hypoxic and requiring CPAP which she refused.  This improved with management of her hypertensive emergency -He is on 2 L nasal cannula this morning     Hyperkalemia Management with dialysis   Hemiparesis affecting right side as late effect of cerebrovascular accident (CVA) (HCC) -PT OT consult     Polysubstance abuse (HCC) Denies she still makes urine I have requested urine sample but so far she did not provide.   Paroxysmal atrial fibrillation (HCC) In sinus rhythm here - Continue Coreg , apixaban    History of CVA (cerebrovascular accident) - Continue home Crestor , apixaban    Non compliance with medical treatment     Tobacco use disorder Cessation recommended   History of anemia due to CKD Hgb stable, no bleeding reported or osberved   ESRD (end stage renal disease) (HCC) - Consult Nephrology   Malnutrition of moderate degree - Consult dietitian       DVT prophylaxis: Eliquis  Code Status: Full code Family Communication: None at bedside Disposition:   Status is: Inpatient    Consultants:  Renal Subjective:  Patient reports headache overnight, currently resolved  Objective: Vitals:   10/29/23 0619 10/29/23 0800 10/29/23 0821 10/29/23 1038  BP: (!) 148/102 (!) 181/114 (!) 181/114 (!) 161/87  Pulse:  86 89   Resp:  19 19   Temp:  98.9 F (37.2 C)   TempSrc:      SpO2:  96% 91%   Weight:        Intake/Output Summary (Last 24 hours) at 10/29/2023 1237 Last data filed at 10/29/2023 0454 Gross per 24 hour  Intake 480 ml  Output 2000 ml  Net -1520 ml   Filed Weights   10/28/23 2042 10/29/23 0500  Weight: 46 kg 46.2 kg    Examination:  Awake Alert, Oriented X 3,  frail, thin appearing Symmetrical Chest wall movement, Good air movement bilaterally RRR, tachycardic +ve B.Sounds, Abd Soft, No tenderness, No rebound - guarding or rigidity. No Cyanosis, Clubbing or edema, No new Rash or bruise       Data Reviewed: I have personally reviewed following labs and imaging studies  CBC: Recent Labs  Lab 10/28/23 0620 10/28/23 0820 10/29/23 0422  WBC  --  8.7 4.6  NEUTROABS  --  6.8  --   HGB 11.9* 10.0* 8.9*  HCT 35.0* 32.2* 28.4*  MCV  --  93.9 93.4  PLT  --  325 260    Basic Metabolic Panel: Recent Labs  Lab 10/28/23 0620 10/28/23 0820 10/29/23 0422  NA 138 138 138  K 5.1 5.2* 4.2  CL 99 97* 97*  CO2  --  26 30  GLUCOSE 107* 97 94  BUN 33* 32* 15  CREATININE 7.90* 7.68* 4.85*  CALCIUM   --  9.4 9.0    GFR: Estimated Creatinine Clearance: 7.8 mL/min (A) (by C-G formula based on SCr of 4.85 mg/dL (H)).  Liver Function Tests: Recent Labs  Lab 10/28/23 0820  AST 19  ALT 13  ALKPHOS 70  BILITOT 0.8  PROT 7.9  ALBUMIN  4.1    CBG: No results for input(s): "GLUCAP" in the last 168 hours.   Recent Results (from the past 240 hours)  MRSA Next Gen by PCR, Nasal     Status: None   Collection Time: 10/28/23  8:22 AM   Specimen: Nasal Mucosa; Nasal Swab  Result Value Ref Range Status   MRSA by PCR Next Gen NOT DETECTED NOT DETECTED Final    Comment: (NOTE) The GeneXpert MRSA Assay (FDA approved for NASAL specimens only), is one component of a comprehensive MRSA colonization surveillance program. It is not intended to diagnose MRSA infection nor to guide or monitor treatment for MRSA infections. Test performance is not FDA approved in patients less than 48 years old. Performed at [facility], [address] [place], [place] [address]          Radiology Studies: CT HEAD WO CONTRAST ( ) Result Date: 10/28/2023 CLINICAL DATA:  Sudden onset headache EXAM: CT HEAD WITHOUT CONTRAST TECHNIQUE: Contiguous axial  images were obtained from the base of the skull through the vertex without intravenous contrast. RADIATION DOSE REDUCTION: This exam was performed according to the departmental dose-optimization program which includes automated exposure control, adjustment of the mA and/or kV according to patient size and/or use of iterative reconstruction technique. COMPARISON:  None Available. FINDINGS: Brain: There is no mass, hemorrhage or extra-axial collection. The size and configuration of the ventricles and extra-axial CSF spaces are normal. There is hypoattenuation of the white matter, most commonly indicating chronic small vessel disease. Old right caudate head small vessel infarct. Vascular: Atherosclerotic calcification of the internal carotid arteries at the skull base. No abnormal hyperdensity of the major intracranial arteries or dural venous sinuses. Skull: The visualized skull base, calvarium and extracranial soft tissues are normal. Sinuses/Orbits:  No fluid levels or advanced mucosal thickening of the visualized paranasal sinuses. No mastoid or middle ear effusion. Normal orbits. Other: None. IMPRESSION: 1. No acute intracranial abnormality. 2. Chronic small vessel disease and old right caudate head small vessel infarct. Electronically Signed   By: Juanetta Nordmann M.D.   On: 10/28/2023 22:53   DG Chest Portable 1 View Result Date: 10/28/2023 CLINICAL DATA:  Shortness of breath. EXAM: PORTABLE CHEST 1 VIEW COMPARISON:  10/12/2023 FINDINGS: The cardio pericardial silhouette is enlarged. Interstitial markings are diffusely coarsened. Patchy airspace opacity at the right base is progressive in the interval, compatible with atelectasis or infiltrate. Right IJ central line tip overlies the right atrium. No pneumothorax or pleural effusion. Telemetry leads overlie the chest. IMPRESSION: Progressive patchy airspace opacity at the right base, compatible with atelectasis or infiltrate. Diffuse interstitial opacity may be  chronic although interstitial edema not excluded. Electronically Signed   By: Donnal Fusi M.D.   On: 10/28/2023 06:40        Scheduled Meds:  amLODipine   5 mg Oral Daily   apixaban   2.5 mg Oral BID   carvedilol   25 mg Oral BID WC   cloNIDine   0.1 mg Oral Daily   hydrALAZINE   100 mg Oral Q8H   isosorbide  mononitrate  30 mg Oral Daily   pantoprazole   40 mg Oral BID   rosuvastatin   10 mg Oral q AM   Continuous Infusions:   LOS: 1 day      Seena Dadds, MD [facility]   To contact the attending provider between 7A-7P or the covering provider during after hours 7P-7A, please log into the web site [email or web address] and access using universal [place] password for that web site. If you do not have the password, please call the hospital operator.  10/29/2023, 12:37 PM

## 2023-10-29 NOTE — Plan of Care (Signed)

## 2023-10-29 NOTE — Progress Notes (Signed)
 [facility] Progress Note  Subjective:  Seen in room [place] O2 UF 2 L w/ HD yest  Vitals:   10/29/23 0619 10/29/23 0800 10/29/23 0821 10/29/23 1038  BP: (!) 148/102 (!) 181/114 (!) 181/114 (!) 161/87  Pulse:  86 89   Resp:  19 19   Temp:   98.9 F (37.2 C)   TempSrc:      SpO2:  96% 91%   Weight:        Exam: Gen awake, 2L [place] O2, no distress No jvd or bruits Chest clear bilat  RRR tachy, no RG Abd soft ntnd no mass or ascites +bs Ext no sig LE or UE edema Neuro is alert, Ox 3 , nf         Renal-related home meds: Norvasc  10 every day Coreg  25 bid Clonidine  0.1mg   Hydralazine  100 mg tid Lokelma  10gm mwf        OP HD: AF TTS  From April 2025 -> 3.5h  B400  46kg   2k bath RIJ TDC  Heparin  none       Assessment/ Plan: Resp distress: w/ severely uncont HTN, bilat IS pulm edema on CXR. Refused bipap in ED. With IV ntg and IV bp meds the BP's ultimately came down and pt was able to dialysis upstairs yest afternoon. Only got 2 L off, but pt looks sig better today. Still requiring O2. Plan HD tomorrow, cont to lower volume.  ESRD: outpatient HD TTS. HD as above.  HTN: severe, 230/160 in ED. Improving, 160/105 today. Cont bp meds and vol lowering w/ HD.  Volume: no sig edema, may be need edw lowered Anemia of esrd: Hb 11-12, no esa needs.  Secondary hyperparathyroidism: Ca in range. Follow.      Larry Poag MD  [facility] 10/29/2023, 12:35 PM  Recent Labs  Lab 10/28/23 0820 10/29/23 0422  HGB 10.0* 8.9*  ALBUMIN  4.1  --   CALCIUM  9.4 9.0  CREATININE 7.68* 4.85*  K 5.2* 4.2   No results for input(s): "IRON", "TIBC", "FERRITIN" in the last 168 hours. Inpatient medications:  amLODipine   5 mg Oral Daily   apixaban   2.5 mg Oral BID   carvedilol   25 mg Oral BID WC   cloNIDine   0.1 mg Oral Daily   hydrALAZINE   100 mg Oral Q8H   isosorbide  mononitrate  30 mg Oral Daily   pantoprazole   40 mg Oral BID   rosuvastatin   10 mg Oral q AM    acetaminophen ,  docusate sodium , hydrALAZINE , metoprolol  tartrate, polyethylene glycol, traZODone , trimethobenzamide 

## 2023-10-29 NOTE — Evaluation (Signed)
 Physical Therapy Evaluation Patient Details Name: Monique Henderson MRN: [id number] DOB: [birth year] Today's Date: 10/29/2023  History of Present Illness  73 y.o. female presents to [facility] 10/28/23 with SOB. Found to be hypertensive with acute CHF and likely acute metabolic encephalopathy. CXR showed edema. 5/29 rapid response for elevated HR and significant h/a. Negative head CT, EKG showed 1st degree AV block with PACs, aberrant conduction, R BBB. Ten prior admits in past year for fluid overload, last admit 2 weeks prior. PMH includes ESRD on HD TTS, anemia, A fib on Eliquis , HTN, HLD, COPD, breast CA s/p mastectomy, CVA with residual R deficits, low back pain s/p lumbar fusion, Hep C   Clinical Impression  Pt in bed upon arrival and agreeable to bed level PT eval. PTA, pt was independent for mobility with no AD. In today's session pt was able to roll with ModI. Pt reported ambulating to the bathroom earlier with RN and declined OOB mobility. LE strength WFL when tested in supine. Educated pt on the importance of continued mobility with pt agreeing to work with therapy in future visits. Pt has intermittent level of assist from family available at home. Pt will have a flight of steps to enter apartment which may be a barrier for d/c. Pt declined post-acute PT services at this time. Will continue to follow and assess needs. Pt would benefit from acute skilled PT with current functional limitations listed below (see PT Problem List).   BP 162/100, 75 BPM        If plan is discharge home, recommend the following: Assist for transportation;Help with stairs or ramp for entrance     Equipment Recommendations None recommended by PT     Functional Status Assessment Patient has had a recent decline in their functional status and demonstrates the ability to make significant improvements in function in a reasonable and predictable amount of time.     Precautions / Restrictions Precautions Precautions:  Fall Precaution/Restrictions Comments: Notify MD if SBD >180 Restrictions Weight Bearing Restrictions Per Provider Order: No      Mobility  Bed Mobility Overal bed mobility: Needs Assistance Bed Mobility: Rolling Rolling: Modified independent (Device/Increase time)      General bed mobility comments: Rolled independently to the left without use of rail    Transfers    General transfer comment: Pt declined, reported walking to bathroom earlier with RN       Balance Overall balance assessment: History of Falls (Unable to assess)       Pertinent Vitals/Pain Pain Assessment Pain Assessment: Faces Faces Pain Scale: Hurts even more Pain Location: abdomen Pain Descriptors / Indicators: Aching, Discomfort Pain Intervention(s): Limited activity within patient's tolerance, Monitored during session, Repositioned    Home Living Family/patient expects to be discharged to:: Private residence Living Arrangements: Alone Available Help at Discharge: Family;Available PRN/intermittently Type of Home: Apartment (2nd floor) Home Access: Stairs to enter Entrance Stairs-Rails: Left Entrance Stairs-Number of Steps: pt reports flight of stairs   Home Layout: One level Home Equipment: Agricultural consultant (2 wheels);Cane - single point;BSC/3in1 Additional Comments: lift chair broke.    Prior Function Prior Level of Function : Independent/Modified Independent;Needs assist;History of Falls (last six months)  Mobility Comments: no AD use, 1 prior fall due to losing balance ADLs Comments: ind, sister assists pt with medications, [place] bus takes pt to HD     Extremity/Trunk Assessment   Upper Extremity Assessment Upper Extremity Assessment: Defer to OT evaluation    Lower Extremity Assessment Lower Extremity  Assessment: Overall WFL for tasks assessed (Grossly 5/5 per MMT in supine. Alert to light touch)    Cervical / Trunk Assessment Cervical / Trunk Assessment: Normal  Communication    Communication Communication: Other (comment) (slowed speech, pt reports is baseline since CVA 2 years ago)    Cognition Arousal: Alert Behavior During Therapy: WFL for tasks assessed/performed   PT - Cognitive impairments: Orientation   Orientation impairments: Time    Following commands: Intact       Cueing Cueing Techniques: Verbal cues      PT Assessment Patient needs continued PT services  PT Problem List Decreased activity tolerance;Decreased balance;Decreased mobility       PT Treatment Interventions DME instruction;Gait training;Stair training;Functional mobility training;Therapeutic activities;Therapeutic exercise;Neuromuscular re-education;Balance training;Patient/family education    PT Goals (Current goals can be found in the Care Plan section)  Acute Rehab PT Goals Patient Stated Goal: to go home PT Goal Formulation: With patient Time For Goal Achievement: 11/12/23 Potential to Achieve Goals: Good    Frequency Min 2X/week        AM-PAC PT "6 Clicks" Mobility  Outcome Measure Help needed turning from your back to your side while in a flat bed without using bedrails?: None Help needed moving from lying on your back to sitting on the side of a flat bed without using bedrails?: None Help needed moving to and from a bed to a chair (including a wheelchair)?: A Little Help needed standing up from a chair using your arms (e.g., wheelchair or bedside chair)?: A Little Help needed to walk in hospital room?: A Little Help needed climbing 3-5 steps with a railing? : A Lot 6 Click Score: 19    End of Session Equipment Utilized During Treatment: Oxygen  Activity Tolerance: Other (comment) (pt limited PT eval) Patient left: in bed;with call bell/phone within reach Nurse Communication: Mobility status PT Visit Diagnosis: Other abnormalities of gait and mobility (R26.89);History of falling (Z91.81)    Time: 7829-5621 PT Time Calculation (min) (ACUTE ONLY): 14  min   Charges:   PT Evaluation $PT Eval Low Complexity: 1 Low   PT General Charges $$ ACUTE PT VISIT: 1 Visit        Orysia Blas, PT, DPT Secure Chat Preferred  Rehab Office [phone]   Alissa April Adela Ades 10/29/2023, 4:44 PM

## 2023-10-29 NOTE — Progress Notes (Addendum)
 Initial Nutrition Assessment  DOCUMENTATION CODES:   Severe malnutrition in context of chronic illness  INTERVENTION:  Add renal MVI w/ minerals Add Magic cup TID with meals, each supplement provides 290 kcal and 9 grams of protein  Double portion proteins to meal trays Discussed importance of adherence with BP meds and fluid allowance as dictated by dialysis center  NUTRITION DIAGNOSIS:   Severe Malnutrition related to chronic illness (ESRD-HD, CVA w/ R hemiparesis, CHF) as evidenced by severe muscle depletion, severe fat depletion.  GOAL:  Patient will meet greater than or equal to 90% of their needs  MONITOR:  PO intake, Supplement acceptance, Weight trends, Labs  REASON FOR ASSESSMENT:  Consult Assessment of nutrition requirement/status  ASSESSMENT:  Pt with PMH significant for: ESRD-HD, CVA w/ R hemiparesis, sCHF EF 45%. Presented with SOB and admitted for hypertensive crisis and fluid overload. Of note, this is her 10th admission since January of this year. She is frequently admitted for fluid overload.   Patient being challenged down with back to back dialysis to improve symptoms and manage volume overload. Suspect non-adherence with fluid restriction and blood pressure medications. Of note, she was admitted two weeks ago for similar presentation and left AMA.  Average Meal Intake 100% x1 documented meal  She endorses appetite at baseline prior to admission. C/o stomach pain today during bedside assessment. Reports this happens every two weeks and Tylenol  is effective in relieving her symptoms. Not open to nutrition supplements. She receives these at dialysis and does not prefer them. She is amicable to double portion proteins. Will add Magic Cup to meal trays.   24 Hour Recall B: sausage, egg, grits, toast w/ coffee and milk L: baked chicken w/ creamed potatoes and mixed vegetables D: piece of fruit (peach or apple) Snacks: rarely  No difficulties chewing or  swallowing at baseline. Has misplaced her bottom dentures, but has an appointment to begin process of having them replaced. Endorses 1-2 BM per day, at baseline. Had one earlier this morning, per her report.    Admit/Current Weight: 46.2kg EDW: 46kg  Last HD tx: 5/29 UF removed: 2L  No reported significant weight loss reported in last 6-12 months, however fluid overload may be masking this. Average UF at outpatient treatments is 3.5L. Discussed implications of excessive fluid pulls and fluid overload on patient's health. She verbalizing understanding, but states she gets thirsty and is going to drink what she wants. As evidenced by her nutrition-focused physical exam, she meets criteria for severe malnutrition in the context of chronic illness.   Per chart review, consistent trend upward since January of this year. Likely all fluid-related. Being challenged down in weight while admitted. May need adjustment in target weight upon discharge. No edema on exam. She reports UBW as around 107lbs. Current body weight around 101lbs with fluid likely artificially increasing this recorded weight.  Drains/Lines: R subclavian: Tunneled dialysis catheter UOP: anuric at baseline  She is on 2K bath at OP HD clinic. Has not required any repletion. Monitoring electrolytes. Will add renal MVI.  Meds: pantoprazole   Labs:  Na+ 138 (wdl) K+ 5.2>4.2 (wdl) CBGs 94-97 x24 hours U9W 4.1 (08/2023)  NUTRITION - FOCUSED PHYSICAL EXAM:  Flowsheet Row Most Recent Value  Orbital Region Moderate depletion  Upper Arm Region Severe depletion  Thoracic and Lumbar Region Severe depletion  Buccal Region Moderate depletion  Temple Region Moderate depletion  Clavicle Bone Region Severe depletion  Clavicle and Acromion Bone Region Severe depletion  Scapular Bone Region Severe  depletion  Dorsal Hand Moderate depletion  Patellar Region Severe depletion  Anterior Thigh Region Severe depletion  Posterior Calf Region  Severe depletion  Edema (RD Assessment) None  Hair Reviewed  Eyes Reviewed  Mouth Reviewed  [lost lower dentures]  Skin Reviewed  Nails Reviewed   Diet Order:   Diet Order             Diet renal/carb modified with fluid restriction Diet-HS Snack? Nothing; Fluid restriction: 1200 mL Fluid; Room service appropriate? Yes; Fluid consistency: Thin  Diet effective now            EDUCATION NEEDS:   Education needs have been addressed  Skin:  Skin Assessment: Reviewed RN Assessment  Last BM:  5/30  Height:  Ht Readings from Last 1 Encounters:  10/11/23 5\' 3"  (1.6 m)   Weight:  Wt Readings from Last 1 Encounters:  10/29/23 46.2 kg   Ideal Body Weight:  52.3 kg  BMI:  Body mass index is 18.04 kg/m.  Estimated Nutritional Needs:   Kcal:  1500-1700kcal  Protein:  60-75g  Fluid:  1L + UOP  Con Decant MS, RD, LDN Registered Dietitian Clinical Nutrition RD Inpatient Contact Info in Amion

## 2023-10-30 ENCOUNTER — Other Ambulatory Visit: Payer: Self-pay | Admitting: Family Medicine

## 2023-10-30 DIAGNOSIS — E43 Unspecified severe protein-calorie malnutrition: Secondary | ICD-10-CM | POA: Insufficient documentation

## 2023-10-30 DIAGNOSIS — I169 Hypertensive crisis, unspecified: Secondary | ICD-10-CM | POA: Diagnosis not present

## 2023-10-30 LAB — CBC
HCT: 26.8 % — ABNORMAL LOW (ref 36.0–46.0)
Hemoglobin: 8.2 g/dL — ABNORMAL LOW (ref 12.0–15.0)
MCH: 29.1 pg (ref 26.0–34.0)
MCHC: 30.6 g/dL (ref 30.0–36.0)
MCV: 95 fL (ref 80.0–100.0)
Platelets: 238 10*3/uL (ref 150–400)
RBC: 2.82 MIL/uL — ABNORMAL LOW (ref 3.87–5.11)
RDW: 20.5 % — ABNORMAL HIGH (ref 11.5–15.5)
WBC: 5.6 10*3/uL (ref 4.0–10.5)
nRBC: 0 % (ref 0.0–0.2)

## 2023-10-30 LAB — RENAL FUNCTION PANEL
Albumin: 3.2 g/dL — ABNORMAL LOW (ref 3.5–5.0)
Anion gap: 16 — ABNORMAL HIGH (ref 5–15)
BUN: 49 mg/dL — ABNORMAL HIGH (ref 8–23)
CO2: 26 mmol/L (ref 22–32)
Calcium: 9.2 mg/dL (ref 8.9–10.3)
Chloride: 95 mmol/L — ABNORMAL LOW (ref 98–111)
Creatinine, Ser: 9.21 mg/dL — ABNORMAL HIGH (ref 0.44–1.00)
GFR, Estimated: 4 mL/min — ABNORMAL LOW (ref >60)
Glucose, Bld: 120 mg/dL — ABNORMAL HIGH (ref 70–99)
Phosphorus: 5.9 mg/dL — ABNORMAL HIGH (ref 2.5–4.6)
Potassium: 4.3 mmol/L (ref 3.5–5.1)
Sodium: 137 mmol/L (ref 135–145)

## 2023-10-30 MED ORDER — HEPARIN SODIUM (PORCINE) 1000 UNIT/ML DIALYSIS
1000.0000 [IU] | INTRAMUSCULAR | Status: DC | PRN
  Administered 2023-10-31: 3800 [IU]
  Filled 2023-10-30: qty 1

## 2023-10-30 MED ORDER — NEPRO/CARBSTEADY PO LIQD: 237.0000 mL | ORAL | Status: DC | PRN

## 2023-10-30 MED ORDER — LIDOCAINE-PRILOCAINE 2.5-2.5 % EX CREA: 1.0000 | TOPICAL_CREAM | CUTANEOUS | Status: DC | PRN

## 2023-10-30 MED ORDER — ALTEPLASE 2 MG IJ SOLR: 2.0000 mg | Freq: Once | INTRAMUSCULAR | Status: DC | PRN

## 2023-10-30 MED ORDER — HEPARIN SODIUM (PORCINE) 1000 UNIT/ML DIALYSIS: 1000.0000 [IU] | INTRAMUSCULAR | Status: DC | PRN

## 2023-10-30 MED ORDER — HEPARIN SODIUM (PORCINE) 1000 UNIT/ML IJ SOLN
INTRAMUSCULAR | Status: AC
  Filled 2023-10-30: qty 4

## 2023-10-30 MED ORDER — ANTICOAGULANT SODIUM CITRATE 4% (200MG/5ML) IV SOLN: 5.0000 mL | Status: DC | PRN

## 2023-10-30 MED ORDER — PENTAFLUOROPROP-TETRAFLUOROETH EX AERO: 1.0000 | INHALATION_SPRAY | CUTANEOUS | Status: DC | PRN

## 2023-10-30 MED ORDER — LIDOCAINE HCL (PF) 1 % IJ SOLN: 5.0000 mL | INTRAMUSCULAR | Status: DC | PRN

## 2023-10-30 NOTE — Evaluation (Signed)
 Occupational Therapy Evaluation Patient Details Name: Monique Henderson MRN: [id number] DOB: March 04, 1951 Today's Date: 10/30/2023   History of Present Illness   73 y.o. female presents to [facility] 10/28/23 with SOB. Found to be hypertensive with acute CHF and likely acute metabolic encephalopathy. CXR showed edema. 5/29 rapid response for elevated HR and significant h/a. Negative head CT, EKG showed 1st degree AV block with PACs, aberrant conduction, R BBB. Ten prior admits in past year for fluid overload, last admit 2 weeks prior. PMH includes ESRD on HD TTS, anemia, A fib on Eliquis , HTN, HLD, COPD, breast CA s/p mastectomy, CVA with residual R deficits, low back pain s/p lumbar fusion, Hep C     Clinical Impressions Pt admitted for above, PTA pt reports being ind with ADLs. Pt with hx of R hand deficits from prior CVA, not able to use it for functional grasp but pt able to complete ADLs with mod I of this date. Pt also ambulating halls ind. Reinforced education of CHF diet guidelines. Pt close to functional baseline and has no further acute skilled OT needs. No post acute OT recommended.      If plan is discharge home, recommend the following:   Assist for transportation     Functional Status Assessment   Patient has not had a recent decline in their functional status     Equipment Recommendations   None recommended by OT     Recommendations for Other Services         Precautions/Restrictions   Precautions Precautions: Fall Precaution/Restrictions Comments: Notify MD if SBD >180 Restrictions Weight Bearing Restrictions Per Provider Order: No     Mobility Bed Mobility Overal bed mobility: Independent             General bed mobility comments: in recliner on arrival    Transfers Overall transfer level: Independent                        Balance Overall balance assessment: History of Falls, No apparent balance deficits (not formally assessed)                                          ADL either performed or assessed with clinical judgement   ADL Overall ADL's : Independent                                       General ADL Comments: Pt ambulating in hall and completing ADLs ind     Vision   Vision Assessment?: No apparent visual deficits     Perception         Praxis         Pertinent Vitals/Pain Pain Assessment Pain Assessment: No/denies pain     Extremity/Trunk Assessment Upper Extremity Assessment Upper Extremity Assessment: Overall WFL for tasks assessed;RUE deficits/detail RUE Deficits / Details: R hand no AROM of digits at baseline from prior CVA. Fingers rest in ext   Lower Extremity Assessment Lower Extremity Assessment: Overall WFL for tasks assessed   Cervical / Trunk Assessment Cervical / Trunk Assessment: Normal   Communication Communication Communication: Other (comment) (slowed speech, pt reports is baseline since CVA 2 years ago)   Cognition Arousal: Alert Behavior During Therapy: WFL for tasks assessed/performed Cognition: No apparent impairments  Following commands: Intact       Cueing  General Comments   Cueing Techniques: Verbal cues      Exercises     Shoulder Instructions      Home Living Family/patient expects to be discharged to:: Private residence Living Arrangements: Alone Available Help at Discharge: Family;Available PRN/intermittently Type of Home: Apartment (2nd floor) Home Access: Stairs to enter Entrance Stairs-Number of Steps: pt reports flight of stairs Entrance Stairs-Rails: Left Home Layout: One level     Bathroom Shower/Tub: Tub/shower unit;Curtain   Bathroom Toilet: Standard Bathroom Accessibility: Yes   Home Equipment: Agricultural consultant (2 wheels);Cane - single point;BSC/3in1   Additional Comments: lift chair broke.      Prior Functioning/Environment Prior Level of  Function : Independent/Modified Independent;Needs assist;History of Falls (last six months)             Mobility Comments: no AD use, 1 prior fall due to losing balance ADLs Comments: ind, sister assists pt with medications, [place] bus takes pt to HD    OT Problem List: Decreased knowledge of precautions   OT Treatment/Interventions:        OT Goals(Current goals can be found in the care plan section)   Acute Rehab OT Goals Patient Stated Goal: To go home OT Goal Formulation: All assessment and education complete, DC therapy Time For Goal Achievement: 11/13/23 Potential to Achieve Goals: Good   OT Frequency:       Co-evaluation              AM-PAC OT "6 Clicks" Daily Activity     Outcome Measure Help from another person eating meals?: None Help from another person taking care of personal grooming?: None Help from another person toileting, which includes using toliet, bedpan, or urinal?: None Help from another person bathing (including washing, rinsing, drying)?: None Help from another person to put on and taking off regular upper body clothing?: None Help from another person to put on and taking off regular lower body clothing?: None 6 Click Score: 24   End of Session Nurse Communication: Mobility status  Activity Tolerance: Patient tolerated treatment well Patient left: in bed;with call bell/phone within reach  OT Visit Diagnosis: Other (comment) (SOB)                Time: 1610-9604 OT Time Calculation (min): 11 min Charges:  OT General Charges $OT Visit: 1 Visit OT Evaluation $OT Eval Low Complexity: 1 Low  10/30/2023  AB, OTR/L  Acute Rehabilitation Services  Office: [phone]   Jorene New 10/30/2023, 2:12 PM

## 2023-10-30 NOTE — Progress Notes (Signed)
 Messaged MD to ask for CVC maintenance orderset.

## 2023-10-30 NOTE — Plan of Care (Signed)

## 2023-10-30 NOTE — Progress Notes (Signed)
 [facility] Progress Note  Subjective:  Seen in room [place] O2, no c/o's  Vitals:   10/30/23 0517 10/30/23 0600 10/30/23 0622 10/30/23 0843  BP: (!) 166/82  (!) 179/92 (!) 168/81  Pulse: 66 66 69 68  Resp: 14 (!) 21 20 15   Temp:    97.6 F (36.4 C)  TempSrc:    Oral  SpO2: 100% 100% 100% 97%  Weight:        Exam: Gen awake, 2L [place] O2, no distress No jvd or bruits Chest clear bilat  RRR tachy, no RG Abd soft ntnd no mass or ascites +bs Ext no sig LE or UE edema Neuro is alert, Ox 3 , nf         Renal-related home meds: Norvasc  10 every day Coreg  25 bid Clonidine  0.1mg   Hydralazine  100 mg tid Lokelma  10gm mwf        OP HD: AF TTS 3.5h  B400  43.5kg   2k bath RIJ TDC  Heparin  none       Assessment/ Plan: Resp distress: w/ severely uncont HTN, bilat IS pulm edema on CXR. Refused bipap in ED. With IV ntg and IV bp meds BP's improved and pt was dialyzed 5/29 w/ 2 L UF.  Still requiring O2. Plan HD today, cont to lower vol.  ESRD: outpatient HD TTS. HD as above.  HTN: severe, 230/160 in ED. Improving, 160/95 today. Cont bp meds and vol lowering w/ HD.  Volume: no sig edema, max UF w/ HD today Anemia of esrd: Hb 11-12, no esa needs.  Secondary hyperparathyroidism: Ca in range. Follow.      Larry Poag MD  [facility] 10/30/2023, 9:06 AM  Recent Labs  Lab 10/28/23 0820 10/29/23 0422  HGB 10.0* 8.9*  ALBUMIN  4.1  --   CALCIUM  9.4 9.0  CREATININE 7.68* 4.85*  K 5.2* 4.2   No results for input(s): "IRON", "TIBC", "FERRITIN" in the last 168 hours. Inpatient medications:  amLODipine   5 mg Oral Daily   apixaban   2.5 mg Oral BID   carvedilol   25 mg Oral BID WC   cloNIDine   0.1 mg Oral Daily   hydrALAZINE   100 mg Oral Q8H   isosorbide  mononitrate  30 mg Oral Daily   multivitamin  1 tablet Oral QHS   pantoprazole   40 mg Oral BID   rosuvastatin   10 mg Oral q AM    acetaminophen , docusate sodium , hydrALAZINE , metoprolol  tartrate, polyethylene glycol,  traZODone , trimethobenzamide 

## 2023-10-30 NOTE — Progress Notes (Signed)
 Patient refused morning labs. Notified Dr. Felipe Horton with hospitalist team via secure chat.

## 2023-10-30 NOTE — Progress Notes (Signed)
 PROGRESS NOTE    Monique Henderson  WJX:[id number] DOB: [birth year] DOA: 10/28/2023 PCP: Charle Congo, MD    Chief Complaint  Patient presents with   Shortness of Breath    Brief Narrative:   73 y.o. F with ESRD on HD TThS, frequent admissions for fluids overload (10th admission since Jan), hx CVA with right hemiparesis, pAF on Eliquis , active smoker, sCHF EF 45% who presented with SOB.  With multiple admissions in the past, this is her 10th admission this year, was recently 2 weeks ago presents with volume overload, she left AGAINST MEDICAL ADVICE she presents again with shortness of breath, workup significant for hypertensive crisis, likely setting of noncompliant with medication.   Assessment & Plan:   Principal Problem:   Hypertensive crisis Active Problems:   Fluid overload   Hyperkalemia   Acute on chronic HFrEF (heart failure with reduced ejection fraction) (HCC)   Acute hypoxemic respiratory failure (HCC)   Non compliance with medical treatment   History of CVA (cerebrovascular accident)   Paroxysmal atrial fibrillation (HCC)   Polysubstance abuse (HCC)   Hemiparesis affecting right side as late effect of cerebrovascular accident (CVA) (HCC)   Tobacco use disorder   Malnutrition of moderate degree   ESRD (end stage renal disease) (HCC)   History of anemia due to CKD   Protein-calorie malnutrition, severe   Hypertensive crisis Hypertensive emergency -Presetned with BP 240/140 as well as acute CHF and likely acute metabolic encephalopathy . - Started on nitroglycerin  drip, weaned off drip in ER, currently transition to her home regimen with as needed medications, overall blood pressure much improved, but does remain slightly on the higher side, but I will hold on uptitrating her medication as she will receive another HD today, her volume status should improve with further dialysis . - Doubt she has been compliant with her medications, as blood pressure much improved  now she is getting her home regimen here in the hospital . - Sugar should further improve with dialysis, so we will hold on getting it down so fast     Fluid overload Acute on chronic systolic heart failure -Significant evidence of volume overload, volume management with dialysis, she will be dialyzed daily as discussed with renal till her volume status is optimized, she is going for another HD today.    Acute hypoxemic respiratory failure (HCC) Presented with respiratory distress, was hypoxic and requiring CPAP which she refused.  This improved with management of her hypertensive emergency - Remains on 2 L oxygen  nasal cannula, but does appear to maintaining oxygen  saturation in the 90s once discontinued.     Hyperkalemia Management with dialysis Solved   Hemiparesis affecting right side as late effect of cerebrovascular accident (CVA) (HCC) -PT OT consult     Polysubstance abuse (HCC) Denies she still makes urine I have requested urine sample but so far she did not provide.  She is okay with us  trying in and out.   Paroxysmal atrial fibrillation (HCC) In sinus rhythm here - Continue Coreg , apixaban    History of CVA (cerebrovascular accident) - Continue home Crestor , apixaban    Non compliance with medical treatment     Tobacco use disorder Cessation recommended   History of anemia due to CKD Hgb stable, no bleeding reported or osberved   ESRD (end stage renal disease) (HCC) - Consult Nephrology   Malnutrition of moderate degree - Consult dietitian       DVT prophylaxis: Eliquis  Code Status: Full code Family Communication: None at  bedside Disposition:   Status is: Inpatient    Consultants:  Renal Subjective:  No significant events overnight, she denies any complaints this morning  Objective: Vitals:   10/30/23 0517 10/30/23 0600 10/30/23 0622 10/30/23 0843  BP: (!) 166/82  (!) 179/92 (!) 168/81  Pulse: 66 66 69 68  Resp: 14 (!) 21 20 15   Temp:     97.6 F (36.4 C)  TempSrc:    Oral  SpO2: 100% 100% 100% 97%  Weight:        Intake/Output Summary (Last 24 hours) at 10/30/2023 1046 Last data filed at 10/30/2023 0600 Gross per 24 hour  Intake 537 ml  Output 0 ml  Net 537 ml   Filed Weights   10/28/23 2042 10/29/23 0500 10/30/23 0500  Weight: 46 kg 46.2 kg 47.9 kg    Examination:  Awake Alert, Oriented X 3, frail, thin appearing Good air entry bilaterally Regular rate and rhythm Abdomen soft Extremities with no edema     Data Reviewed: I have personally reviewed following labs and imaging studies  CBC: Recent Labs  Lab 10/28/23 0620 10/28/23 0820 10/29/23 0422  WBC  --  8.7 4.6  NEUTROABS  --  6.8  --   HGB 11.9* 10.0* 8.9*  HCT 35.0* 32.2* 28.4*  MCV  --  93.9 93.4  PLT  --  325 260    Basic Metabolic Panel: Recent Labs  Lab 10/28/23 0620 10/28/23 0820 10/29/23 0422  NA 138 138 138  K 5.1 5.2* 4.2  CL 99 97* 97*  CO2  --  26 30  GLUCOSE 107* 97 94  BUN 33* 32* 15  CREATININE 7.90* 7.68* 4.85*  CALCIUM   --  9.4 9.0    GFR: Estimated Creatinine Clearance: 8 mL/min (A) (by C-G formula based on SCr of 4.85 mg/dL (H)).  Liver Function Tests: Recent Labs  Lab 10/28/23 0820  AST 19  ALT 13  ALKPHOS 70  BILITOT 0.8  PROT 7.9  ALBUMIN  4.1    CBG: No results for input(s): "GLUCAP" in the last 168 hours.   Recent Results (from the past 240 hours)  MRSA Next Gen by PCR, Nasal     Status: None   Collection Time: 10/28/23  8:22 AM   Specimen: Nasal Mucosa; Nasal Swab  Result Value Ref Range Status   MRSA by PCR Next Gen NOT DETECTED NOT DETECTED Final    Comment: (NOTE) The GeneXpert MRSA Assay (FDA approved for NASAL specimens only), is one component of a comprehensive MRSA colonization surveillance program. It is not intended to diagnose MRSA infection nor to guide or monitor treatment for MRSA infections. Test performance is not FDA approved in patients less than 69  years old. Performed at [facility], [address] [place], [place] [address]          Radiology Studies: CT HEAD WO CONTRAST ( ) Result Date: 10/28/2023 CLINICAL DATA:  Sudden onset headache EXAM: CT HEAD WITHOUT CONTRAST TECHNIQUE: Contiguous axial images were obtained from the base of the skull through the vertex without intravenous contrast. RADIATION DOSE REDUCTION: This exam was performed according to the departmental dose-optimization program which includes automated exposure control, adjustment of the mA and/or kV according to patient size and/or use of iterative reconstruction technique. COMPARISON:  None Available. FINDINGS: Brain: There is no mass, hemorrhage or extra-axial collection. The size and configuration of the ventricles and extra-axial CSF spaces are normal. There is hypoattenuation of the white matter, most commonly indicating  chronic small vessel disease. Old right caudate head small vessel infarct. Vascular: Atherosclerotic calcification of the internal carotid arteries at the skull base. No abnormal hyperdensity of the major intracranial arteries or dural venous sinuses. Skull: The visualized skull base, calvarium and extracranial soft tissues are normal. Sinuses/Orbits: No fluid levels or advanced mucosal thickening of the visualized paranasal sinuses. No mastoid or middle ear effusion. Normal orbits. Other: None. IMPRESSION: 1. No acute intracranial abnormality. 2. Chronic small vessel disease and old right caudate head small vessel infarct. Electronically Signed   By: Juanetta Nordmann M.D.   On: 10/28/2023 22:53        Scheduled Meds:  amLODipine   5 mg Oral Daily   apixaban   2.5 mg Oral BID   carvedilol   25 mg Oral BID WC   cloNIDine   0.1 mg Oral Daily   hydrALAZINE   100 mg Oral Q8H   isosorbide  mononitrate  30 mg Oral Daily   multivitamin  1 tablet Oral QHS   pantoprazole   40 mg Oral BID   rosuvastatin   10 mg Oral q AM   Continuous Infusions:   anticoagulant sodium citrate        LOS: 2 days      Seena Dadds, MD [facility]   To contact the attending provider between 7A-7P or the covering provider during after hours 7P-7A, please log into the web site [email or web address] and access using universal [place] password for that web site. If you do not have the password, please call the hospital operator.  10/30/2023, 10:46 AM

## 2023-10-30 NOTE — Progress Notes (Signed)
 Patient has HD catheter and did not have CHG orders in place. R.N. contacted Dr. Brock Canner with hospitalist team and received order via Secure Chat to order CHG order set. When R.N. went to place order set, pt. flagged for a CHG allergy. Orders canceled. Dr. Brock Canner notified.

## 2023-10-31 DIAGNOSIS — I169 Hypertensive crisis, unspecified: Secondary | ICD-10-CM | POA: Diagnosis not present

## 2023-10-31 LAB — RAPID URINE DRUG SCREEN, HOSP PERFORMED
Amphetamines: NOT DETECTED
Barbiturates: NOT DETECTED
Benzodiazepines: NOT DETECTED
Cocaine: NOT DETECTED
Opiates: NOT DETECTED
Tetrahydrocannabinol: NOT DETECTED

## 2023-10-31 LAB — GLUCOSE, CAPILLARY: Glucose-Capillary: 149 mg/dL — ABNORMAL HIGH (ref 70–99)

## 2023-10-31 MED ORDER — CLONIDINE HCL 0.1 MG PO TABS
0.1000 mg | ORAL_TABLET | Freq: Two times a day (BID) | ORAL | Status: DC
  Administered 2023-10-31: 0.1 mg via ORAL
  Filled 2023-10-31: qty 1

## 2023-10-31 MED ORDER — AMLODIPINE BESYLATE 5 MG PO TABS
5.0000 mg | ORAL_TABLET | Freq: Once | ORAL | Status: AC
  Administered 2023-10-31: 5 mg via ORAL
  Filled 2023-10-31 (×2): qty 1

## 2023-10-31 MED ORDER — HYDRALAZINE HCL 20 MG/ML IJ SOLN
10.0000 mg | Freq: Once | INTRAMUSCULAR | Status: AC
  Administered 2023-10-31: 10 mg via INTRAVENOUS
  Filled 2023-10-31: qty 1

## 2023-10-31 MED ORDER — HYDRALAZINE HCL 20 MG/ML IJ SOLN
20.0000 mg | INTRAMUSCULAR | Status: DC | PRN
  Administered 2023-10-31: 20 mg via INTRAVENOUS
  Filled 2023-10-31: qty 1

## 2023-10-31 MED ORDER — AMLODIPINE BESYLATE 10 MG PO TABS
10.0000 mg | ORAL_TABLET | Freq: Every day | ORAL | Status: DC
  Administered 2023-10-31: 10 mg via ORAL
  Filled 2023-10-31: qty 1

## 2023-10-31 NOTE — Discharge Planning (Signed)
 [facility] Patient Discharge Orders- [facility] CLINIC: [facility]  Patient's name: AYAAT JANSMA Admit/DC Dates: 10/28/2023 - 10/31/2023  Discharge Diagnoses: Hypertensive emergency ARF/Pulm edema   Recent Labs  Lab 10/30/23 2120  HGB 8.2*  K 4.3  CALCIUM  9.2  PHOS 5.9*  ALBUMIN  3.2*    Aranesp : Given: --   Date of last dose/amount: --   PRBC's Given: -- Date/# of units: -- ESA dose for discharge: Mircera 225 mcg IV q 2 wks. Continue Venofer   Outpatient Dialysis Orders:  -Heparin : No change  -EDW No change   -Bath: No change   Access intervention/Change: --    IV Antibiotics: --   OTHER/APPTS/LABS     Completed by: Elona Hal PA-C   D/C Meds to be reconciled by nurse after every discharge.    Reviewed by: MD:______ RN_______

## 2023-10-31 NOTE — Progress Notes (Signed)
 Received patient in bed to unit.  Alert and orientedx4 Informed consent signed and in chart.   TX duration:3 hours  Patient tolerated well. Pt bp high during the extire tx prn and due meds given . Md informed of pt's bo Transported back to the room  Alert, without acute distress.  Hand-off given to patient's nurse.   Access used: dialysis cath Access issues: none  Total UF removed: Medication(s) given: hydralazine  20mg , amlodipine  5mg , coreg  25mg . Heparin  1.9 units per port Post HD VS: see table below Post HD weight: 45.1kg   10/31/23 0037  Vitals  Temp 98.4 F (36.9 C)  Temp Source Oral  BP (!) 190/95  MAP (mmHg) 123  BP Location Left Arm  BP Method Automatic  Patient Position (if appropriate) Lying  Pulse Rate 80  Pulse Rate Source Monitor  ECG Heart Rate 79  Resp (!) 36  Oxygen  Therapy  SpO2 93 %  O2 Device Room Air  Patient Activity (if Appropriate) In bed  Pulse Oximetry Type Continuous  During Treatment Monitoring  Blood Flow Rate (mL/min) 0 mL/min  Arterial Pressure (mmHg) 24.64 mmHg  Venous Pressure (mmHg) -415.94 mmHg  TMP (mmHg) 13.53 mmHg  Ultrafiltration Rate (mL/min) 1188 mL/min  Dialysate Flow Rate (mL/min) 300 ml/min  Dialysate Potassium Concentration 3  Dialysate Calcium  Concentration 2.5  Duration of HD Treatment -hour(s) 3 hour(s)  Cumulative Fluid Removed (mL) per Treatment  3048.79  HD Safety Checks Performed Yes  Intra-Hemodialysis Comments Tolerated well  Post Treatment  Dialyzer Clearance Heavily streaked  Hemodialysis Intake (mL) 0 mL  Liters Processed 72  Fluid Removed (mL) 3000 mL  Tolerated HD Treatment Yes  Hemodialysis Catheter Right Subclavian Double lumen Permanent (Tunneled)  Placement Date/Time: 05/28/23 0942   Placed prior to admission: No  Serial / Lot #: [id number]  Expiration Date: 04/01/27  Time Out: Correct patient;Correct site;Correct procedure  Maximum sterile barrier precautions: Sterile gloves;Sterile  gown;Mask;C...  Site Condition No complications  Blue Lumen Status Flushed;Heparin  locked;Dead end cap in place  Red Lumen Status Flushed;Heparin  locked;Dead end cap in place  Purple Lumen Status N/A  Catheter fill solution Heparin  1000 units/ml  Catheter fill volume (Arterial) 1.9 cc  Catheter fill volume (Venous) 1.9  Post treatment catheter status Capped and Clamped      [facility]

## 2023-10-31 NOTE — Plan of Care (Signed)

## 2023-10-31 NOTE — Progress Notes (Signed)
 Pt discharged from unit via [facility] with charge RN

## 2023-10-31 NOTE — Progress Notes (Addendum)
 MUSE score Red d/t BP and resp. rate.  Pt. BP 204/106 @ 0126. Dr. Brock Canner already aware of pt's SBP being >200, as it was >200 at dialysis this evening. Pt. just returned from dialysis and 5W R.N. gave pt. the one time doses of 5 mg PO norvasc  and 10 mg IV hydralazine ; both at 0126.   While at dialysis, pt. also received:  5 mg PO norvasc  at 2234  10 mg IV hydralazine  at 2235  25 mg PO carvedilol  at 2235  Charge Nurse Murphy Arn, R.N. is aware. Dr. Brock Canner aware that pt. was running this high while in dialysis. IV did go bad while R.N. was flushing it after hydralazine  administration. IV team is in patient's room now, placing a new IV.   It is worth noting that patient's RR is high right now as she is anxious about new IV being placed.  Also worth noting that patient missed all her daytime BP and HR meds as the dayshift R.N. was told by dialysis that pt. would go to dialysis around 0900, and the timing kept getting delayed. Pt. didn't end up going to dialysis until around 2100.

## 2023-10-31 NOTE — Plan of Care (Signed)

## 2023-10-31 NOTE — Progress Notes (Signed)
 Discharge instructions, discharge med list, follow up care reviewed with pt who verbalized understanding.  IV and tele removed.  Pt has all her belongings.  Pick up ride is en route.

## 2023-10-31 NOTE — Progress Notes (Signed)
 [facility] Progress Note  Subjective:  Seen in room On room air, had HD yest w/ 3 L off   Vitals:   10/31/23 0500 10/31/23 0600 10/31/23 0615 10/31/23 0900  BP:  (!) 176/96 (!) 176/96   Pulse: 79 78 79   Resp: 13 20 (!) 21   Temp:  99.4 F (37.4 C)    TempSrc:  Oral    SpO2: 96% 95% 96%   Weight: 45.4 kg   43.3 kg    Exam: Gen awake, 2L [place] O2, no distress No jvd or bruits Chest clear bilat  RRR tachy, no RG Abd soft ntnd no mass or ascites +bs Ext no sig LE or UE edema Neuro is alert, Ox 3 , nf         Renal-related home meds: Norvasc  10 every day Coreg  25 bid Clonidine  0.1mg   Hydralazine  100 mg tid Lokelma  10gm mwf        OP HD: AF TTS 3.5h  B400  43.5kg   2k bath RIJ TDC  Heparin  none       Assessment/ Plan: Resp distress: w/ severely uncont HTN, bilat IS pulm edema on CXR. Refused bipap in ED. With IV ntg and IV bp meds BP's improved and pt was dialyzed 5/29 w/ 2 L UF, then another 3 L off w/ HD 5/31. Off O2 this am, at dry wt. OK for dc home.  ESRD: outpatient HD TTS. HD as above.  HTN: severe, 230/160 in ED. Improved. Cont bp meds and vol lowering w/ HD.  Volume: no sig edema Anemia of esrd: Hb 11-12, no esa needs.  Secondary hyperparathyroidism: Ca in range. Follow.  Dispo: for dc today per pmd     Larry Poag MD  [facility] 10/31/2023, 6:43 PM  Recent Labs  Lab 10/28/23 0820 10/29/23 0422 10/30/23 2120  HGB 10.0* 8.9* 8.2*  ALBUMIN  4.1  --  3.2*  CALCIUM  9.4 9.0 9.2  PHOS  --   --  5.9*  CREATININE 7.68* 4.85* 9.21*  K 5.2* 4.2 4.3   No results for input(s): "IRON", "TIBC", "FERRITIN" in the last 168 hours. Inpatient medications:  amLODipine   10 mg Oral Daily   apixaban   2.5 mg Oral BID   carvedilol   25 mg Oral BID WC   cloNIDine   0.1 mg Oral BID   hydrALAZINE   100 mg Oral Q8H   isosorbide  mononitrate  30 mg Oral Daily   multivitamin  1 tablet Oral QHS   pantoprazole   40 mg Oral BID   rosuvastatin   10 mg Oral q AM     acetaminophen , docusate sodium , hydrALAZINE , metoprolol  tartrate, polyethylene glycol, traZODone , trimethobenzamide 

## 2023-10-31 NOTE — Discharge Summary (Signed)
 Physician Discharge Summary  Monique Henderson YNW:[id number] DOB: [birth year] DOA: 10/28/2023  PCP: Charle Congo, MD  Admit date: 10/28/2023 Discharge date: 10/31/2023  Admitted From: (Home) Disposition:  (Home)  Recommendations for Outpatient Follow-up:  Follow up with PCP in 1-2 weeks Please obtain BMP/CBC in one week   Diet recommendation: Heart Healthy /renal with 1200 cc fluid restrictions  Brief/Interim Summary:  73 y.o. F with ESRD on HD TThS, frequent admissions for fluids overload (10th admission since Jan), hx CVA with right hemiparesis, pAF on Eliquis , active smoker, sCHF EF 45% who presented with SOB. With multiple admissions in the past, this is her 10th admission this year, was recently 2 weeks ago presents with volume overload, she left AGAINST MEDICAL ADVICE she presents again with shortness of breath, workup significant for hypertensive crisis, likely setting of noncompliant with medication.  Volume overload, please see discussion below.     Hypertensive crisis Hypertensive emergency -Presetned with BP 240/140 as well as acute CHF and likely acute metabolic encephalopathy . - Started on nitroglycerin  drip, weaned off drip in ER, currently transitioned to her home regimen with as needed medications, overall blood pressure much improved, but does remain slightly on the higher side, further uptitrating has been held given her significant volume overload, in anticipation of back-to-back dialysis, volume status much improved with dialysis as well her blood pressure, blood pressure yesterday has been elevated as her meds been held in anticipation of dialysis which has been postponed to later in that evening, now she is back on her home meds and received them this morning and blood pressure much improved, so she will be discharged on her her regimen without increasing any meds.   - Doubt she has been compliant with her medications, as blood pressure much improved now she is getting  her home regimen here in the hospital .     Fluid overload Acute on chronic systolic heart failure -Significant evidence of volume overload, volume management with dialysis, she did receive extra dialysis during hospital stay, currently her volume status is optimized.       Acute hypoxemic respiratory failure (HCC) Presented with respiratory distress, was hypoxic and requiring CPAP which she refused.  This improved with management of her hypertensive emergency and volume status, she is currently on room air.     Hyperkalemia Management with dialysis Resolved   Hemiparesis affecting right side as late effect of cerebrovascular accident (CVA) (HCC) -PT OT consulted     Polysubstance abuse (HCC) Denies any  further use,   Paroxysmal atrial fibrillation (HCC) In sinus rhythm here - Continue Coreg , apixaban    History of CVA (cerebrovascular accident) - Continue home Crestor , apixaban    Non compliance with medical treatment     Tobacco use disorder Cessation recommended   History of anemia due to CKD Hgb stable, no bleeding reported or osberved   ESRD (end stage renal disease) (HCC) - Consult Nephrology   Malnutrition of moderate degree - Consult dietitian          Discharge Diagnoses:  Principal Problem:   Hypertensive crisis Active Problems:   Fluid overload   Hyperkalemia   Acute on chronic HFrEF (heart failure with reduced ejection fraction) (HCC)   Acute hypoxemic respiratory failure (HCC)   Non compliance with medical treatment   History of CVA (cerebrovascular accident)   Paroxysmal atrial fibrillation (HCC)   Polysubstance abuse (HCC)   Hemiparesis affecting right side as late effect of cerebrovascular accident (CVA) (HCC)   Tobacco use disorder  Malnutrition of moderate degree   ESRD (end stage renal disease) (HCC)   History of anemia due to CKD   Protein-calorie malnutrition, severe    Discharge Instructions  Discharge Instructions      Diet - low sodium heart healthy   Complete by: As directed    Discharge instructions   Complete by: As directed    Follow with Primary MD Charle Congo, MD in 7 days   Get CBC, CMP, 2 view Chest X ray checked  by Primary MD next visit.    Activity: As tolerated with Full fall precautions use walker/cane & assistance as needed   Disposition Home    Diet: Heart Healthy/renal with 1200 cc fluid restrictions  For Heart failure patients - Check your Weight same time everyday, if you gain over 2 pounds, or you develop in leg swelling, experience more shortness of breath or chest pain, call your Primary MD immediately. Follow Cardiac Low Salt Diet and 1.2 lit/day fluid restriction.   On your next visit with your primary care physician please Get Medicines reviewed and adjusted.   Please request your Prim.MD to go over all Hospital Tests and Procedure/Radiological results at the follow up, please get all Hospital records sent to your Prim MD by signing hospital release before you go home.   If you experience worsening of your admission symptoms, develop shortness of breath, life threatening emergency, suicidal or homicidal thoughts you must seek medical attention immediately by calling 911 or calling your MD immediately  if symptoms less severe.  You Must read complete instructions/literature along with all the possible adverse reactions/side effects for all the Medicines you take and that have been prescribed to you. Take any new Medicines after you have completely understood and accpet all the possible adverse reactions/side effects.   Do not drive, operating heavy machinery, perform activities at heights, swimming or participation in water activities or provide baby sitting services if your were admitted for syncope or siezures until you have seen by Primary MD or a Neurologist and advised to do so again.  Do not drive when taking Pain medications.    Do not take more than prescribed  Pain, Sleep and Anxiety Medications  Special Instructions: If you have smoked or chewed Tobacco  in the last 2 yrs please stop smoking, stop any regular Alcohol   and or any Recreational drug use.  Wear Seat belts while driving.   Please note  You were cared for by a hospitalist during your hospital stay. If you have any questions about your discharge medications or the care you received while you were in the hospital after you are discharged, you can call the unit and asked to speak with the hospitalist on call if the hospitalist that took care of you is not available. Once you are discharged, your primary care physician will handle any further medical issues. Please note that NO REFILLS for any discharge medications will be authorized once you are discharged, as it is imperative that you return to your primary care physician (or establish a relationship with a primary care physician if you do not have one) for your aftercare needs so that they can reassess your need for medications and monitor your lab values.   Increase activity slowly   Complete by: As directed       Allergies as of 10/31/2023       Reactions   Shrimp [shellfish Allergy] Shortness Of Breath   Bactroban  [mupirocin ] Other (See Comments)   "Sores in  nose"   Hydrocodone  Itching, Nausea And Vomiting, Nausea Only   Chlorhexidine  Gluconate Itching   Latex Itching   Latex gloves cause itchiness and a rash   Tylenol  [acetaminophen ] Itching   Takes Percocet at home 05/06/23   Zestril  [lisinopril ] Cough        Medication List     TAKE these medications    acetaminophen  325 MG tablet Commonly known as: TYLENOL  Take 650 mg by mouth every 6 (six) hours as needed for mild pain (pain score 1-3) or moderate pain (pain score 4-6).   albuterol  (2.5 MG/3ML) 0.083% nebulizer solution Commonly known as: PROVENTIL  Take 3 mLs (2.5 mg total) by nebulization every 6 (six) hours as needed for wheezing or shortness of breath.    albuterol  108 (90 Base) MCG/ACT inhaler Commonly known as: VENTOLIN  HFA Inhale 2 puffs into the lungs every 6 (six) hours as needed.   amLODipine  10 MG tablet Commonly known as: NORVASC  Take 1 tablet (10 mg total) by mouth daily. What changed: when to take this   apixaban  2.5 MG Tabs tablet Commonly known as: ELIQUIS  Take 1 tablet (2.5 mg total) by mouth 2 (two) times daily. What changed: additional instructions   carvedilol  25 MG tablet Commonly known as: COREG  Take 1 tablet (25 mg total) by mouth 2 (two) times daily with a meal. What changed: additional instructions   cinacalcet  60 MG tablet Commonly known as: SENSIPAR  Take 60 mg by mouth every dialysis (Tuesday, Thursday, and Saturday).   cloNIDine  0.1 MG tablet Commonly known as: Catapres  Take 1 tablet (0.1 mg total) by mouth 2 (two) times daily. Do not take it before dialysis What changed: additional instructions   hydrALAZINE  100 MG tablet Commonly known as: APRESOLINE  Take 1 tablet (100 mg total) by mouth every 8 (eight) hours. What changed:  when to take this additional instructions   isosorbide  mononitrate 30 MG 24 hr tablet Commonly known as: IMDUR  TAKE 1 TABLET (30MG ) ON DIALYSIS DAYS AND 2 TABLETS (60 MG) ON NON DIALYSIS DAYS.   Lokelma  10 g Pack packet Generic drug: sodium zirconium cyclosilicate  Take 10 g by mouth every Monday, Wednesday, and Friday.   naloxone  4 MG/0.1ML Liqd nasal spray kit Commonly known as: NARCAN  Place 1 spray into the nose as needed (opioid overdose).   oxyCODONE -acetaminophen  10-325 MG tablet Commonly known as: PERCOCET Take 1 tablet by mouth 4 (four) times daily as needed for pain.   pantoprazole  40 MG tablet Commonly known as: PROTONIX  TAKE 1 TABLET (40 MG TOTAL) BY MOUTH 2 (TWO) TIMES DAILY. What changed: additional instructions   rosuvastatin  10 MG tablet Commonly known as: CRESTOR  TAKE 1 TABLET (10 MG TOTAL) BY MOUTH DAILY FOR CHOLESTEROL What changed: See the new  instructions.   senna-docusate 8.6-50 MG tablet Commonly known as: Senokot-S Take 1 tablet by mouth 2 (two) times daily between meals as needed for mild constipation.   traZODone  50 MG tablet Commonly known as: DESYREL  Take 25-50 mg by mouth at bedtime as needed for sleep.   Vitamin D -3 125 MCG (5000 UT) Tabs Take 1 tablet by mouth in the morning.        Allergies  Allergen Reactions   Shrimp [Shellfish Allergy] Shortness Of Breath   Bactroban  [Mupirocin ] Other (See Comments)    "Sores in nose"   Hydrocodone  Itching, Nausea And Vomiting and Nausea Only   Chlorhexidine  Gluconate Itching   Latex Itching    Latex gloves cause itchiness and a rash   Tylenol  [Acetaminophen ] Itching  Takes Percocet at home 05/06/23   Zestril  [Lisinopril ] Cough    Consultations: renal   Procedures/Studies: CT HEAD WO CONTRAST ( ) Result Date: 10/28/2023 CLINICAL DATA:  Sudden onset headache EXAM: CT HEAD WITHOUT CONTRAST TECHNIQUE: Contiguous axial images were obtained from the base of the skull through the vertex without intravenous contrast. RADIATION DOSE REDUCTION: This exam was performed according to the departmental dose-optimization program which includes automated exposure control, adjustment of the mA and/or kV according to patient size and/or use of iterative reconstruction technique. COMPARISON:  None Available. FINDINGS: Brain: There is no mass, hemorrhage or extra-axial collection. The size and configuration of the ventricles and extra-axial CSF spaces are normal. There is hypoattenuation of the white matter, most commonly indicating chronic small vessel disease. Old right caudate head small vessel infarct. Vascular: Atherosclerotic calcification of the internal carotid arteries at the skull base. No abnormal hyperdensity of the major intracranial arteries or dural venous sinuses. Skull: The visualized skull base, calvarium and extracranial soft tissues are normal. Sinuses/Orbits: No  fluid levels or advanced mucosal thickening of the visualized paranasal sinuses. No mastoid or middle ear effusion. Normal orbits. Other: None. IMPRESSION: 1. No acute intracranial abnormality. 2. Chronic small vessel disease and old right caudate head small vessel infarct. Electronically Signed   By: Juanetta Nordmann M.D.   On: 10/28/2023 22:53   DG Chest Portable 1 View Result Date: 10/28/2023 CLINICAL DATA:  Shortness of breath. EXAM: PORTABLE CHEST 1 VIEW COMPARISON:  10/12/2023 FINDINGS: The cardio pericardial silhouette is enlarged. Interstitial markings are diffusely coarsened. Patchy airspace opacity at the right base is progressive in the interval, compatible with atelectasis or infiltrate. Right IJ central line tip overlies the right atrium. No pneumothorax or pleural effusion. Telemetry leads overlie the chest. IMPRESSION: Progressive patchy airspace opacity at the right base, compatible with atelectasis or infiltrate. Diffuse interstitial opacity may be chronic although interstitial edema not excluded. Electronically Signed   By: Donnal Fusi M.D.   On: 10/28/2023 06:40   DG CHEST PORT 1 VIEW Result Date: 10/12/2023 CLINICAL DATA:  Chest pain and shortness breath EXAM: PORTABLE CHEST 1 VIEW COMPARISON:  10/12/2023 FINDINGS: Right IJ dialysis catheter tip: Right atrium. Prior median sternotomy. Moderate enlargement of the cardiopericardial silhouette with indistinct pulmonary vasculature and hazy interstitial accentuation raising the possibility of interstitial edema or less likely atypical pneumonia. Stable hazy airspace opacity at the right lung base potentially from confluent edema or pneumonia. No blunting of the costophrenic angles. IMPRESSION: 1. Moderate enlargement of the cardiopericardial silhouette with indistinct pulmonary vasculature and hazy interstitial accentuation raising the possibility of interstitial edema or less likely atypical pneumonia. 2. Stable hazy airspace opacity at the  right lung base potentially from confluent edema or pneumonia. 3. Right IJ dialysis catheter tip: Right atrium. Electronically Signed   By: Freida Jes M.D.   On: 10/12/2023 19:03   DG Chest Portable 1 View Result Date: 10/12/2023 CLINICAL DATA:  Shortness of breath EXAM: PORTABLE CHEST 1 VIEW COMPARISON:  09/22/2023 FINDINGS: Right-sided central venous catheter with tip at the right atrium. Post sternotomy changes. Cardiomegaly with diffuse interstitial and ground-glass opacity suspicious for pulmonary edema. This is slightly more confluent at the right base. Possible trace right-sided effusion. Aortic atherosclerosis. No pneumothorax IMPRESSION: Cardiomegaly with diffuse interstitial and ground-glass opacity suspicious for pulmonary edema. Possible trace right-sided effusion. Patchy bibasilar airspace disease right greater than left, probably due to edema though infiltrate is not excluded. Electronically Signed   By: Ulyess Gammons.D.  On: 10/12/2023 00:11     Subjective:  She denies any complaints this morning, she was dialyzed late yesterday, Discharge Exam: Vitals:   10/31/23 0600 10/31/23 0615  BP: (!) 176/96 (!) 176/96  Pulse: 78 79  Resp: 20 (!) 21  Temp: 99.4 F (37.4 C)   SpO2: 95% 96%   Vitals:   10/31/23 0500 10/31/23 0600 10/31/23 0615 10/31/23 0900  BP:  (!) 176/96 (!) 176/96   Pulse: 79 78 79   Resp: 13 20 (!) 21   Temp:  99.4 F (37.4 C)    TempSrc:  Oral    SpO2: 96% 95% 96%   Weight: 45.4 kg   43.3 kg    General: Pt is alert, awake, not in acute distress, baseline right-sided weakness and slurred speech Cardiovascular: RRR, S1/S2 +, no rubs, no gallops Respiratory: CTA bilaterally, no wheezing, no rhonchi Abdominal: Soft, NT, ND, bowel sounds + Extremities: no edema, no cyanosis    The results of significant diagnostics from this hospitalization (including imaging, microbiology, ancillary and laboratory) are listed below for reference.      Microbiology: Recent Results (from the past 240 hours)  MRSA Next Gen by PCR, Nasal     Status: None   Collection Time: 10/28/23  8:22 AM   Specimen: Nasal Mucosa; Nasal Swab  Result Value Ref Range Status   MRSA by PCR Next Gen NOT DETECTED NOT DETECTED Final    Comment: (NOTE) The GeneXpert MRSA Assay (FDA approved for NASAL specimens only), is one component of a comprehensive MRSA colonization surveillance program. It is not intended to diagnose MRSA infection nor to guide or monitor treatment for MRSA infections. Test performance is not FDA approved in patients less than 78 years old. Performed at [facility], [address], [place], [place] [address]      Labs: BNP (last 3 results) Recent Labs    08/31/23 0800 09/21/23 2009 10/12/23 0000  BNP >4,500.0* >4,500.0* >4,500.0*   Basic Metabolic Panel: Recent Labs  Lab 10/28/23 0620 10/28/23 0820 10/29/23 0422 10/30/23 2120  NA 138 138 138 137  K 5.1 5.2* 4.2 4.3  CL 99 97* 97* 95*  CO2  --  26 30 26   GLUCOSE 107* 97 94 120*  BUN 33* 32* 15 49*  CREATININE 7.90* 7.68* 4.85* 9.21*  CALCIUM   --  9.4 9.0 9.2  PHOS  --   --   --  5.9*   Liver Function Tests: Recent Labs  Lab 10/28/23 0820 10/30/23 2120  AST 19  --   ALT 13  --   ALKPHOS 70  --   BILITOT 0.8  --   PROT 7.9  --   ALBUMIN  4.1 3.2*   No results for input(s): "LIPASE", "AMYLASE" in the last 168 hours. No results for input(s): "AMMONIA" in the last 168 hours. CBC: Recent Labs  Lab 10/28/23 0620 10/28/23 0820 10/29/23 0422 10/30/23 2120  WBC  --  8.7 4.6 5.6  NEUTROABS  --  6.8  --   --   HGB 11.9* 10.0* 8.9* 8.2*  HCT 35.0* 32.2* 28.4* 26.8*  MCV  --  93.9 93.4 95.0  PLT  --  325 260 238   Cardiac Enzymes: No results for input(s): "CKTOTAL", "CKMB", "CKMBINDEX", "TROPONINI" in the last 168 hours. BNP: Invalid input(s): "POCBNP" CBG: Recent Labs  Lab 10/31/23 0835  GLUCAP 149*   D-Dimer No results for input(s):  "DDIMER" in the last 72 hours. Hgb A1c No results for  input(s): "HGBA1C" in the last 72 hours. Lipid Profile No results for input(s): "CHOL", "HDL", "LDLCALC", "TRIG", "CHOLHDL", "LDLDIRECT" in the last 72 hours. Thyroid  function studies No results for input(s): "TSH", "T4TOTAL", "T3FREE", "THYROIDAB" in the last 72 hours.  Invalid input(s): "FREET3" Anemia work up No results for input(s): "VITAMINB12", "FOLATE", "FERRITIN", "TIBC", "IRON", "RETICCTPCT" in the last 72 hours. Urinalysis    Component Value Date/Time   COLORURINE AMBER (A) 12/27/2021 1840   APPEARANCEUR TURBID (A) 12/27/2021 1840   LABSPEC 1.013 12/27/2021 1840   PHURINE 8.0 12/27/2021 1840   GLUCOSEU NEGATIVE 12/27/2021 1840   GLUCOSEU NEG mg/dL 16/03/9603 5409   HGBUR MODERATE (A) 12/27/2021 1840   BILIRUBINUR NEGATIVE 12/27/2021 1840   BILIRUBINUR negative 05/04/2018 0910   KETONESUR NEGATIVE 12/27/2021 1840   PROTEINUR >=300 (A) 12/27/2021 1840   UROBILINOGEN 0.2 05/04/2018 0910   UROBILINOGEN 1 02/14/2014 0946   NITRITE NEGATIVE 12/27/2021 1840   LEUKOCYTESUR LARGE (A) 12/27/2021 1840   Sepsis Labs Recent Labs  Lab 10/28/23 0820 10/29/23 0422 10/30/23 2120  WBC 8.7 4.6 5.6   Microbiology Recent Results (from the past 240 hours)  MRSA Next Gen by PCR, Nasal     Status: None   Collection Time: 10/28/23  8:22 AM   Specimen: Nasal Mucosa; Nasal Swab  Result Value Ref Range Status   MRSA by PCR Next Gen NOT DETECTED NOT DETECTED Final    Comment: (NOTE) The GeneXpert MRSA Assay (FDA approved for NASAL specimens only), is one component of a comprehensive MRSA colonization surveillance program. It is not intended to diagnose MRSA infection nor to guide or monitor treatment for MRSA infections. Test performance is not FDA approved in patients less than 30 years old. Performed at [facility], [address], [place], [place] [address]      Time coordinating discharge: Over 30  minutes  SIGNED:   Seena Dadds, MD  [facility] 10/31/2023, 12:34 PM Pager   If 7PM-7AM, please contact night-coverage [email or web address]

## 2023-10-31 NOTE — Progress Notes (Signed)
 [facility] night cross cover note:   I was notified by the patient's HD RN  that this patient who is finishing up her HD session at this time, as systolic blood pressures in the 190s, with heart rates in the 70s, after receiving hydralazine  10 mg IV, amlodipine  5 mg p.o. and Coreg  25 mg p.o. around 2230.  No new symptoms reported.  Other vital signs appear stable, including oxygen  saturations in the mid to high 90s on room air.  Per brief chart review, it appears that the patient's blood pressure has been consistently in the 170's to 190's throughout this hospitalization.   I subsequently ordered an additional 10 mg of IV hydralazine  x 1 now and increased her prn iv hydralazine  moving from 10 to 20 mg moving forward. I also increased her existing order for amlodipine  5 mg po qdaily to 10 mg po qdaily, with a one time order for amlodipine  5 mg po x 1 dose now.     Camelia Cavalier, DO Hospitalist

## 2023-11-09 ENCOUNTER — Ambulatory Visit (HOSPITAL_COMMUNITY): Admitting: Physician Assistant

## 2023-11-12 ENCOUNTER — Encounter (HOSPITAL_COMMUNITY): Payer: Self-pay | Admitting: Physician Assistant

## 2023-11-12 ENCOUNTER — Ambulatory Visit (HOSPITAL_COMMUNITY)
Admission: RE | Admit: 2023-11-12 | Discharge: 2023-11-12 | Disposition: A | Discharge status: Home / Self Care | Source: Ambulatory Visit | Attending: Physician Assistant | Admitting: Physician Assistant

## 2023-11-12 ENCOUNTER — Encounter: Payer: Self-pay | Admitting: Cardiology

## 2023-11-12 ENCOUNTER — Ambulatory Visit: Admitting: Cardiology

## 2023-11-12 VITALS — BP 128/70 | HR 74 | Ht 63.0 in | Wt 103.4 lb

## 2023-11-12 VITALS — BP 153/78 | HR 70 | Temp 98.0°F | Ht 63.0 in | Wt 103.4 lb

## 2023-11-12 DIAGNOSIS — I5022 Chronic systolic (congestive) heart failure: Secondary | ICD-10-CM | POA: Diagnosis not present

## 2023-11-12 DIAGNOSIS — Z992 Dependence on renal dialysis: Secondary | ICD-10-CM

## 2023-11-12 DIAGNOSIS — Z8679 Personal history of other diseases of the circulatory system: Secondary | ICD-10-CM | POA: Diagnosis not present

## 2023-11-12 DIAGNOSIS — I10 Essential (primary) hypertension: Secondary | ICD-10-CM

## 2023-11-12 DIAGNOSIS — I484 Atypical atrial flutter: Secondary | ICD-10-CM | POA: Diagnosis not present

## 2023-11-12 DIAGNOSIS — I480 Paroxysmal atrial fibrillation: Secondary | ICD-10-CM

## 2023-11-12 DIAGNOSIS — I6523 Occlusion and stenosis of bilateral carotid arteries: Secondary | ICD-10-CM

## 2023-11-12 DIAGNOSIS — I4819 Other persistent atrial fibrillation: Secondary | ICD-10-CM

## 2023-11-12 DIAGNOSIS — D6869 Other thrombophilia: Secondary | ICD-10-CM | POA: Diagnosis not present

## 2023-11-12 DIAGNOSIS — N186 End stage renal disease: Secondary | ICD-10-CM

## 2023-11-12 NOTE — Progress Notes (Addendum)
 Primary Care Physician: Charle Congo, MD Primary Cardiologist: previously Dr Albert Huff Primary Electrophysiologist: none Referring Physician: [facility]   Monique Henderson is a 73 y.o. 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  The patient was initially diagnosed with atrial fibrillation remotely. Patient is on Eliquis  for a CHADS2VASC score of 7.  She was seen at the ED 10/10/22 with chest discomfort and SOB. She was found to be in afib with rapid rates and underwent DCCV.  Patient returns for follow up for atrial fibrillation. She has had several ED visits and hospitalizations due to hypertensive emergencies and fluid overload/respiratory failure 2/2 missed sessions of HD. She did have afib during an admission in January which spontaneously converted with treatment if her BP and pneumonia. She reports that she feels well today and is in SR. Her sisters have been helping her with her medications. No bleeding issues on anticoagulation.   Today, she  denies symptoms of palpitations, chest pain, shortness of breath, orthopnea, PND, lower extremity edema, dizziness, presyncope, syncope, snoring, daytime somnolence, bleeding. The patient is tolerating medications without difficulties and is otherwise without complaint today.    Atrial Fibrillation Risk Factors:  she does not have symptoms or diagnosis of sleep apnea. she does not have a history of rheumatic fever.   Atrial Fibrillation Management history:  Previous antiarrhythmic drugs: none Previous cardioversions: 10/10/22 Previous ablations: none Anticoagulation history: warfarin,  Eliquis    Past Medical History:  Diagnosis Date   Acute cardioembolic stroke (HCC) 02/26/2022   AF (paroxysmal atrial fibrillation) (HCC) 05/29/2019   on Coumadin    Aortic atherosclerosis (HCC) 07/05/2019   Aortic dissection (HCC) 04/04/2019   s/p repair   Bone spur 2008   Right calcaneal foot spur   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 Lillette Reid; S/P re-excision of superior and inferior margins 10/27/2012.    Dissection of aorta (HCC) 04/03/2019   ESRD on hemodialysis (HCC) 05/29/2019   Essential hypertension 09/16/2006   GERD 09/16/2006   Hepatitis C    treated and RNA confirmed not detectable 01/2017   Insomnia 03/14/2015   Malnutrition of moderate degree 05/19/2019   Non compliance with medical treatment 12/04/2017   Normocytic anemia    With thrombocytosis   Osteoarthritis    Right ureteral stone 2002   S/P lumbar spinal fusion 01/18/2014   S/P lumbar decompressive laminectomy, fusion, and plating for lumbar spinal stenosis on 05/27/2009 by Dr. Isadora Mar.  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 Isadora Mar.     Stroke (cerebrum) (HCC)    Stroke (HCC)  02/23/2022   SVT (supraventricular tachycardia) (HCC) 08/28/2019   Tobacco use disorder 04/19/2009   Uterine fibroid    Wears dentures     top    Current Outpatient Medications  Medication Sig Dispense Refill   acetaminophen  (TYLENOL ) 325 MG tablet Take 650 mg by mouth every 6 (six) hours as needed for mild pain (pain score 1-3) or moderate pain (pain score 4-6).     albuterol  (PROVENTIL ) (2.5 MG/3ML) 0.083% nebulizer solution Take 3 mLs (2.5 mg total) by nebulization every 6 (six) hours as needed for wheezing or shortness of breath. 75 mL 12   albuterol  (VENTOLIN  HFA) 108 (90 Base) MCG/ACT inhaler Inhale 2 puffs into the lungs every 6 (six) hours as needed.     amLODipine  (NORVASC ) 10 MG tablet Take 1 tablet (10 mg total) by mouth daily. 30 tablet 0   apixaban  (ELIQUIS ) 2.5 MG TABS tablet Take 1 tablet (2.5 mg total) by mouth 2 (two) times daily. 60 tablet 0   carvedilol  (COREG ) 25 MG tablet Take 1 tablet (25 mg total) by mouth 2 (two) times daily with a meal. 60 tablet 1   Cholecalciferol  (VITAMIN D -3) 125 MCG (5000 UT) TABS Take 1 tablet by mouth in the morning.     cinacalcet  (SENSIPAR ) 60 MG tablet Take 60 mg by mouth every dialysis (Tuesday, Thursday, and Saturday).     cloNIDine  (CATAPRES ) 0.1 MG tablet Take 1 tablet (0.1 mg total) by mouth 2 (two) times daily. Do not take it before dialysis     hydrALAZINE  (APRESOLINE ) 100 MG tablet Take 1 tablet (100 mg total) by mouth every 8 (eight) hours. 270 tablet 3   isosorbide  mononitrate (IMDUR ) 30 MG 24 hr tablet TAKE 1 TABLET (30MG ) ON DIALYSIS DAYS AND 2 TABLETS (60 MG) ON NON DIALYSIS DAYS. 45 tablet 10   naloxone  (NARCAN ) nasal spray 4 mg/0.1 mL Place 1 spray into the nose as needed (opioid overdose).     oxyCODONE -acetaminophen  (PERCOCET) 10-325 MG tablet Take 1 tablet by mouth 4 (four) times daily as needed for pain.     pantoprazole  (PROTONIX ) 40 MG tablet TAKE 1 TABLET (40 MG TOTAL) BY MOUTH 2 (TWO) TIMES DAILY. 60 tablet 1   rosuvastatin  (CRESTOR ) 10 MG tablet TAKE 1 TABLET (10 MG TOTAL) BY MOUTH DAILY FOR CHOLESTEROL 90 tablet 0   senna-docusate (SENOKOT-S) 8.6-50 MG  tablet Take 1 tablet by mouth 2 (two) times daily between meals as needed for mild constipation. 180 tablet 0   traZODone  (DESYREL ) 50 MG tablet Take 25-50 mg by mouth at bedtime as needed for sleep.     LOKELMA  10 g PACK packet Take 10 g by mouth every Monday, Wednesday, and Friday. (Patient not taking: Reported on 11/12/2023)     No current facility-administered medications for this encounter.    ROS- All systems are reviewed and negative except as per the HPI above.  Physical Exam: Vitals:   11/12/23 0823  BP: 128/70  Pulse: 74  Weight: 46.9 kg  Height: 5' 3 (1.6 m)     GEN: Well nourished, well developed in no acute distress NECK: No JVD; No carotid bruits CARDIAC: Regular rate and rhythm, no murmurs, rubs, gallops RESPIRATORY:  Clear to auscultation without rales, wheezing or rhonchi  ABDOMEN: Soft, non-tender, non-distended EXTREMITIES:  No edema; No deformity  NEURO: decreased ROM and strength in right upper extremity, slurred speech at baseline   Wt Readings from Last 3 Encounters:  11/12/23 46.9 kg  10/31/23 43.3 kg  10/12/23  45.6 kg    EKG today demonstrates  SR, RBBB Vent. rate 74 BPM PR interval 168 ms QRS duration 138 ms QT/QTcB 482/535 ms   Echo 06/03/23 demonstrated  1. Left ventricular ejection fraction, by estimation, is 45%. The left  ventricle has mildly decreased function. The left ventricle demonstrates  regional wall motion abnormalities with apical akinesis. There is severe  concentric left ventricular hypertrophy. Consider cardiac amyloidosis. Left ventricular diastolic parameters are consistent with Grade II diastolic dysfunction (pseudonormalization). The average left ventricular global longitudinal strain is -3.8 %. The global longitudinal strain is abnormal.   2. Right ventricular systolic function is moderately reduced. The right  ventricular size is normal. There is mildly elevated pulmonary artery  systolic pressure. The estimated right  ventricular systolic pressure is  41.9 mmHg.   3. Left atrial size was moderately dilated.   4. The mitral valve is normal in structure. Trivial mitral valve  regurgitation. No evidence of mitral stenosis.   5. Tricuspid valve regurgitation is mild to moderate.   6. The aortic valve is tricuspid. There is mild calcification of the  aortic valve. Aortic valve regurgitation is not visualized. No aortic  stenosis is present.   7. Aortic dilatation noted.   8. The inferior vena cava is normal in size with greater than 50%  respiratory variability, suggesting right atrial pressure of 3 mmHg.     Epic records are reviewed at length today.  CHA2DS2-VASc Score = 7  The patient's score is based upon: CHF History: 1 HTN History: 1 Diabetes History: 0 Stroke History: 2 Vascular Disease History: 1 Age Score: 1 Gender Score: 1       ASSESSMENT AND PLAN: Paroxysmal Atrial Fibrillation/atrial flutter The patient's CHA2DS2-VASc score is 7, indicating a 11.2% annual risk of stroke.   Patient in SR today. Her afib seems well controlled when she is consistent with her medications and HD. If further rhythm control is needed, amiodarone  would be her best option with ESRD.  Continue Eliquis  2.5 mg BID (weight < 60 kg, Cr >1.5) Continue carvedilol  25 mg BID  Secondary Hypercoagulable State (ICD10:  D68.69) The patient is at significant risk for stroke/thromboembolism based upon her CHA2DS2-VASc Score of 7.  Continue Apixaban  (Eliquis ). No bleeding issues.   ESRD On HD Tu/Th/Sa  HFmrEF EF 45% GDMT per primary cardiology team Fluid status appears stable today  HTN Stable on current regimen   Follow up to establish care with Dr Albert Huff today. AF clinic as needed.    Myrtha Ates PA-C [facility] [address] [place], [place] [address] [phone] 11/12/2023 8:32 AM

## 2023-11-12 NOTE — Progress Notes (Signed)
 Cardiology Office Note:  .   Date:  11/12/2023  ID:  Monique Henderson, DOB 1951-01-02, MRN [id number] PCP:  Charle Congo, MD  Former Cardiology Providers: Dr. Avery Bodo [facility] Providers Cardiologist:  Olinda Bertrand, DO , [facility] (established care 11/12/2023) Electrophysiologist:  None  Click to update primary MD,subspecialty MD or APP then REFRESH:1}    Chief Complaint  Patient presents with   Atrial Fibrillation   Follow-up    Establish care    History of Present Illness: .   Monique Henderson is a 73 y.o. 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  Patient has a complex cardiovascular history as outlined above and has been noncompliant with medical therapy as well as hemodialysis and has been under the care of multiple cardiovascular providers in the past.  I am seeing her in the office for the first time to establish care.  She was last seen by Dayna Dunn in January 2025.Formally under the care of Dr. Avery Bodo who last saw Monique Henderson back in June 2024. I am seeing her for the first time to re-establishing care.   Patient is accompanied by her youngest sister Monique Henderson who provides collateral history at today's office visit as well.  Since last office encounter patient denies any anginal chest pain or heart failure symptoms.  She is currently on anticoagulation given her history of paroxysmal atrial fibrillation/flutter and does not endorse any evidence  of bleeding.  She is on hemodialysis given her ESRD states that she still makes minimal urine.  Home blood pressures on nondialysis days are 150-160 mmHg and after dialysis today usually <140 mmHg.  Review of Systems: .   Review of Systems  Cardiovascular:  Negative for chest pain, claudication, irregular heartbeat, leg swelling, near-syncope, orthopnea, palpitations, paroxysmal nocturnal dyspnea and syncope.  Respiratory:  Positive for shortness of breath (chronic and stable).   Hematologic/Lymphatic: Negative for bleeding problem.    Studies Reviewed:   EKG: EKG Interpretation Date/Time:  Friday November 12 2023 09:07:01 EDT Ventricular Rate:  71 PR Interval:  176 QRS Duration:  128 QT Interval:  472 QTC Calculation: 512 R Axis:   85  Text Interpretation: Normal sinus rhythm Possible Left atrial enlargement Right bundle branch block Septal infarct , age undetermined When compared with ECG of 12-Nov-2023 08:25, Septal infarct is now Present Nonspecific T wave abnormality now evident in Inferior leads Confirmed by Olinda Bertrand [phone]) on 11/12/2023 9:26:05 AM  Echocardiogram: 06/2023  1. Left ventricular ejection fraction, by estimation, is 45%. The left  ventricle has mildly decreased function. The left ventricle demonstrates  regional wall motion abnormalities with apical akinesis. There is severe  concentric left ventricular  hypertrophy. Consider cardiac amyloidosis. Left ventricular diastolic  parameters are consistent with Grade II diastolic dysfunction  (pseudonormalization). The average left ventricular global longitudinal  strain is -3.8 %. The global longitudinal strain  is abnormal.   2. Right ventricular systolic function is moderately reduced. The right  ventricular size is normal. There is mildly elevated pulmonary artery  systolic pressure. The estimated right ventricular  systolic pressure is  41.9 mmHg.   3. Left atrial size was moderately dilated.   4. The mitral  valve is normal in structure. Trivial mitral valve  regurgitation. No evidence of mitral stenosis.   5. Tricuspid valve regurgitation is mild to moderate.   6. The aortic valve is tricuspid. There is mild calcification of the  aortic valve. Aortic valve regurgitation is not visualized. No aortic  stenosis is present.   7. Aortic dilatation noted.   8. The inferior vena cava is normal in size with greater than 50%  respiratory variability, suggesting right atrial pressure of 3 mmHg.   PYP Scan 07/2023   Myocardial uptake was negative for radiotracer uptake. The visual grade of myocardial uptake relative to the ribs was Grade 0 (No myocardial uptake and normal bone uptake).   Findings are not suggestive (Grade 0) of cardiac ATTR amyloidosis.  MPI  06/2021 1. No reversible ischemia.  Moderate apical scarring/infarct.   2.  Generalized hypokinesis, severe at the apex.   3. Left ventricular ejection fraction 41%. Moderately abnormal LV systolic volume.   4. Non invasive risk stratification*: Intermediate  Carotid Duplex  05/22/2019 Right Carotid: Velocities in the right ICA are consistent with a 40-59% stenosis. The ECA appears occluded.   Left Carotid: Velocities in the left ICA are consistent with a 40-59% stenosis.   Vertebrals: Bilateral vertebral arteries demonstrate antegrade flow.   RADIOLOGY: CT Angio Chest/Abd/Pel for Dissection W and/or Wo Contrast  08/21/2023 1. No evidence of aortic dissection. Similar postsurgical changes of the ascending aorta. See report for additional details  Risk Assessment/Calculations:   Click Here to Calculate/Change CHADS2VASc Score The patient's CHADS2-VASc score is 7, indicating a 11.2% annual risk of stroke.     Labs:       Latest Ref Rng & Units 10/30/2023    9:20 PM 10/29/2023    4:22 AM 10/28/2023    8:20 AM  CBC  WBC 4.0 - 10.5 K/uL 5.6  4.6  8.7   Hemoglobin 12.0 - 15.0 g/dL 8.2  8.9  16.1   Hematocrit 36.0 - 46.0 % 26.8   28.4  32.2   Platelets 150 - 400 K/uL 238  260  325        Latest Ref Rng & Units 10/30/2023    9:20 PM 10/29/2023    4:22 AM 10/28/2023    8:20 AM  BMP  Glucose 70 - 99 mg/dL 096  94  97   BUN 8 - 23 mg/dL 49  15  32   Creatinine 0.44 - 1.00 mg/dL 0.45  4.09  8.11   Sodium 135 - 145 mmol/L 137  138  138   Potassium 3.5 - 5.1 mmol/L 4.3  4.2  5.2   Chloride 98 - 111 mmol/L 95  97  97   CO2 22 - 32 mmol/L 26  30  26    Calcium  8.9 - 10.3 mg/dL 9.2  9.0  9.4       Latest Ref Rng & Units 10/30/2023    9:20 PM 10/29/2023    4:22 AM 10/28/2023    8:20 AM  CMP  Glucose 70 - 99 mg/dL 914  94  97   BUN 8 - 23 mg/dL 49  15  32   Creatinine 0.44 - 1.00 mg/dL 7.82  9.56  2.13   Sodium 135 - 145 mmol/L 137  138  138   Potassium 3.5 - 5.1 mmol/L 4.3  4.2  5.2  Chloride 98 - 111 mmol/L 95  97  97   CO2 22 - 32 mmol/L 26  30  26    Calcium  8.9 - 10.3 mg/dL 9.2  9.0  9.4   Total Protein 6.5 - 8.1 g/dL   7.9   Total Bilirubin 0.0 - 1.2 mg/dL   0.8   Alkaline Phos 38 - 126 U/L   70   AST 15 - 41 U/L   19   ALT 0 - 44 U/L   13     Lab Results  Component Value Date   CHOL 104 01/29/2023   HDL 49 01/29/2023   LDLCALC 46 01/29/2023   TRIG 46 01/29/2023   CHOLHDL 2.1 01/29/2023   No results for input(s): LIPOA in the last 8760 hours. No components found for: NTPROBNP No results for input(s): PROBNP in the last 8760 hours. No results for input(s): TSH in the last 8760 hours.  Physical Exam:    Today's Vitals   11/12/23 0853  BP: (!) 153/78  Pulse: 70  Temp: 98 F (36.7 C)  SpO2: 94%  Weight: 103 lb 6.4 oz (46.9 kg)  Height: 5' 3 (1.6 m)   Body mass index is 18.32 kg/m. Wt Readings from Last 3 Encounters:  11/12/23 103 lb 6.4 oz (46.9 kg)  11/12/23 103 lb 6.4 oz (46.9 kg)  10/31/23 95 lb 7.4 oz (43.3 kg)    Physical Exam  Constitutional: She appears cachectic. No distress.  hemodynamically stable, temporal wasting  Neck: No JVD present.  Cardiovascular: Normal  rate, regular rhythm, S1 normal and S2 normal. Exam reveals no gallop, no S3 and no S4.  Murmur heard. Holosystolic murmur is present with a grade of 3/6 radiating to the apex. Pulmonary/Chest: Effort normal and breath sounds normal. No stridor. She has no wheezes. She has no rales.  Dialysis catheter right anterior chest wall.  Sternotomy scar  is well-healed  Musculoskeletal:        General: No edema.     Cervical back: Neck supple.  Skin: Skin is warm.     Impression & Recommendation(s):  Impression:   ICD-10-CM   1. Persistent atrial fibrillation (HCC)  I48.19 EKG 12-Lead    2. Chronic heart failure with mildly reduced ejection fraction (HFmrEF, 41-49%) (HCC)  I50.22     3. Bilateral carotid artery stenosis  I65.23 VAS US  CAROTID    4. History of aortic dissection  Z86.79 Ambulatory referral to Cardiothoracic Surgery    5. Essential hypertension  I10     6. ESRD on dialysis (HCC)  N18.6    Z99.2        Recommendation(s):  Persistent atrial fibrillation (HCC) Rate control: Carvedilol . Rhythm control: N/A Thromboembolic prophylaxis: Eliquis  2.5 mg p.o. twice daily EKG illustrates normal sinus rhythm. History of direct-current cardioversion in 10/10/2022. Left atrial size is at least moderately dilated on recent echocardiogram Was being followed by atrial fibrillation clinic-deferred her to general cardiology going forward. Anticoagulation history: Coumadin , Eliquis  Prior antiarrhythmic drugs: N/A  Chronic heart failure with mildly reduced ejection fraction (HFmrEF, 41-49%) (HCC) January 2025: LVEF 45% with grade 2 diastolic dysfunction and elevated left atrial pressures. Severe left ventricular hypertrophy with concerns for possible cardiac amyloidosis PYP study negative GDMT limited secondary to ESRD. Currently on amlodipine  10 mg p.o. daily. Continue Coreg  25 mg p.o. twice daily. Continue Catapres  0.1 mg p.o. twice daily. Continue hydralazine  100 mg p.o. 3 times  daily. Continue Imdur  30 mg on dialysis days and 60 mg on  nondialysis days. Office blood pressures are not at goal. Postdialysis SBP is less than 140 mmHg on current regimen per patient and her sister  Bilateral carotid artery stenosis Known history of bilateral carotid artery stenosis. Patient states that nobody else is following the disease progression. Repeat carotid duplex to reevaluate disease burden  History of aortic dissection History of type aortic dissection status postrepair. Had a CT scan for dissection protocol in March 2025. Will refer her back to cardiothoracic surgery for long-term monitoring of her type a dissection Patient is aware the importance of improving her modifiable cardiovascular risk factors and blood pressure management. Patient states that she has stopped consumption of cocaine .  Essential hypertension Medications as discussed above  ESRD on dialysis [facility]) Followed by nephrology.   Orders Placed:  Orders Placed This Encounter  Procedures   Ambulatory referral to Cardiothoracic Surgery    Referral Priority:   Routine    Referral Type:   Surgical    Referral Reason:   Specialty Services Required    Requested Specialty:   Cardiothoracic Surgery    Number of Visits Requested:   1   EKG 12-Lead     Final Medication List:   No orders of the defined types were placed in this encounter.   There are no discontinued medications.   Current Outpatient Medications:    acetaminophen  (TYLENOL ) 325 MG tablet, Take 650 mg by mouth every 6 (six) hours as needed for mild pain (pain score 1-3) or moderate pain (pain score 4-6)., Disp: , Rfl:    albuterol  (PROVENTIL ) (2.5 MG/3ML) 0.083% nebulizer solution, Take 3 mLs (2.5 mg total) by nebulization every 6 (six) hours as needed for wheezing or shortness of breath., Disp: 75 mL, Rfl: 12   albuterol  (VENTOLIN  HFA) 108 (90 Base) MCG/ACT inhaler, Inhale 2 puffs into the lungs every 6 (six) hours as needed., Disp: ,  Rfl:    amLODipine  (NORVASC ) 10 MG tablet, Take 1 tablet (10 mg total) by mouth daily., Disp: 30 tablet, Rfl: 0   apixaban  (ELIQUIS ) 2.5 MG TABS tablet, Take 1 tablet (2.5 mg total) by mouth 2 (two) times daily., Disp: 60 tablet, Rfl: 0   carvedilol  (COREG ) 25 MG tablet, Take 1 tablet (25 mg total) by mouth 2 (two) times daily with a meal., Disp: 60 tablet, Rfl: 1   Cholecalciferol  (VITAMIN D -3) 125 MCG (5000 UT) TABS, Take 1 tablet by mouth in the morning., Disp: , Rfl:    cinacalcet  (SENSIPAR ) 60 MG tablet, Take 60 mg by mouth every dialysis (Tuesday, Thursday, and Saturday)., Disp: , Rfl:    cloNIDine  (CATAPRES ) 0.1 MG tablet, Take 1 tablet (0.1 mg total) by mouth 2 (two) times daily. Do not take it before dialysis, Disp: , Rfl:    hydrALAZINE  (APRESOLINE ) 100 MG tablet, Take 1 tablet (100 mg total) by mouth every 8 (eight) hours., Disp: 270 tablet, Rfl: 3   isosorbide  mononitrate (IMDUR ) 30 MG 24 hr tablet, TAKE 1 TABLET (30MG ) ON DIALYSIS DAYS AND 2 TABLETS (60 MG) ON NON DIALYSIS DAYS., Disp: 45 tablet, Rfl: 10   LOKELMA  10 g PACK packet, Take 10 g by mouth every Monday, Wednesday, and Friday., Disp: , Rfl:    naloxone  (NARCAN ) nasal spray 4 mg/0.1 mL, Place 1 spray into the nose as needed (opioid overdose)., Disp: , Rfl:    oxyCODONE -acetaminophen  (PERCOCET) 10-325 MG tablet, Take 1 tablet by mouth 4 (four) times daily as needed for pain., Disp: , Rfl:    pantoprazole  (PROTONIX ) 40 MG  tablet, TAKE 1 TABLET (40 MG TOTAL) BY MOUTH 2 (TWO) TIMES DAILY., Disp: 60 tablet, Rfl: 1   rosuvastatin  (CRESTOR ) 10 MG tablet, TAKE 1 TABLET (10 MG TOTAL) BY MOUTH DAILY FOR CHOLESTEROL, Disp: 90 tablet, Rfl: 0   senna-docusate (SENOKOT-S) 8.6-50 MG tablet, Take 1 tablet by mouth 2 (two) times daily between meals as needed for mild constipation., Disp: 180 tablet, Rfl: 0   traZODone  (DESYREL ) 50 MG tablet, Take 25-50 mg by mouth at bedtime as needed for sleep., Disp: , Rfl:   Consent:   NA  Disposition:    6 months with Dayna Dunn and 1 year follow up w/ me   Her questions and concerns were addressed to her satisfaction. She voices understanding of the recommendations provided during this encounter.   Total time spent: 55 minutes. Very complex past medical history as mentioned above.   Has been under the care of multiple cardiovascular providers in the past. Extensive review of her electronic medical records. Result of the following diagnostic workup reviewed as part of today's encounter: Echocardiogram January 2025. Carotid duplex December 2020. CTA chest abdomen pelvis dissection protocol March 2025 Nuclear stress test January 2023 Prescription drug management. Referral placed for cardiothoracic surgery to help monitor/surveillance of her aortic given the type a dissection repair. Ordered additional diagnostic workup as discussed above. Atrial fibrillation clinic progress note 11/12/2023 reviewed Plan of care discussed with the patient and her younger sister Ms. Bass at today's office visit who also provided collateral history as the patient is not the best historian.  Signed, Awilda Bogus, [facility]  A Division of [facility] [address] [place], [place] [address]  [place], [place] [address] 11/12/2023 1:25 PM

## 2023-11-12 NOTE — Patient Instructions (Signed)
 Medication Instructions:  No Changes *If you need a refill on your cardiac medications before your next appointment, please call your pharmacy*  Lab Work: None  Testing/Procedures: Your physician has requested that you have a carotid duplex. This test is an ultrasound of the carotid arteries in your neck. It looks at blood flow through these arteries that supply the brain with blood. Allow one hour for this exam. There are no restrictions or special instructions. This will take place at [address] 4th floor  Please note: We ask at that you not bring children with you during ultrasound (echo/ vascular) testing. Due to room size and safety concerns, children are not allowed in the ultrasound rooms during exams. Our front office staff cannot provide observation of children in our lobby area while testing is being conducted. An adult accompanying a patient to their appointment will only be allowed in the ultrasound room at the discretion of the ultrasound technician under special circumstances. We apologize for any inconvenience.   Follow-Up: At [facility], you and your health needs are our priority.  As part of our continuing mission to provide you with exceptional heart care, our providers are all part of one team.  This team includes your primary Cardiologist (physician) and Advanced Practice Providers or APPs (Physician Assistants and Nurse Practitioners) who all work together to provide you with the care you need, when you need it.  Your next appointment:   6 month(s)  Provider:   Dayna Dunn, PA-C      Then, Olinda Bertrand, DO will plan to see you again in 12 month(s).   We recommend signing up for the patient portal called MyChart.  Sign up information is provided on this After Visit Summary.  MyChart is used to connect with patients for Virtual Visits (Telemedicine).  Patients are able to view lab/test results, encounter notes, upcoming appointments, etc.  Non-urgent messages  can be sent to your provider as well.   To learn more about what you can do with MyChart, go to [email or web address].   Other Instructions Please call us  or send a MyChart message with any Cardiology related questions/concerns.  [phone].  Thank you!

## 2023-12-01 ENCOUNTER — Ambulatory Visit: Attending: Surgery | Admitting: Surgery

## 2023-12-01 ENCOUNTER — Encounter: Payer: Self-pay | Admitting: Surgery

## 2023-12-01 ENCOUNTER — Ambulatory Visit (HOSPITAL_COMMUNITY)
Admission: RE | Admit: 2023-12-01 | Discharge: 2023-12-01 | Disposition: A | Discharge status: Home / Self Care | Source: Ambulatory Visit | Attending: Cardiology | Admitting: Cardiology

## 2023-12-01 ENCOUNTER — Ambulatory Visit: Admitting: Surgery

## 2023-12-01 VITALS — BP 170/80 | HR 76 | Resp 20 | Ht 63.0 in | Wt 103.0 lb

## 2023-12-01 DIAGNOSIS — Z8679 Personal history of other diseases of the circulatory system: Secondary | ICD-10-CM | POA: Diagnosis not present

## 2023-12-01 DIAGNOSIS — I6523 Occlusion and stenosis of bilateral carotid arteries: Secondary | ICD-10-CM | POA: Diagnosis present

## 2023-12-02 ENCOUNTER — Ambulatory Visit: Payer: Self-pay | Admitting: Cardiology

## 2023-12-04 NOTE — Progress Notes (Signed)
 [address], [place] [address]             [phone]       HPI:  The patient is a 73 year old woman who underwent supra-coronary replacement of the ascending aorta with distal hemiarch reconstruction and right axillary artery cannulation for an acute type A aortic dissection by Dr. German on 04/04/2019.  She had advanced chronic kidney disease preoperatively and required hemodialysis since surgery.  She was last seen by Dr. Latisha in 2020.  She was recently seen by Dr. Michele and sent back to see us  for follow-up.  She had a CTA of the chest on 08/21/2023 showing a stable surgical repair with no evidence of residual aortic dissection.  She continues to feel well overall.  Current Outpatient Medications  Medication Sig Dispense Refill   acetaminophen  (TYLENOL ) 325 MG tablet Take 650 mg by mouth every 6 (six) hours as needed for mild pain (pain score 1-3) or moderate pain (pain score 4-6).     albuterol  (PROVENTIL ) (2.5 MG/3ML) 0.083% nebulizer solution Take 3 mLs (2.5 mg total) by nebulization every 6 (six) hours as needed for wheezing or shortness of breath. 75 mL 12   albuterol  (VENTOLIN  HFA) 108 (90 Base) MCG/ACT inhaler Inhale 2 puffs into the lungs every 6 (six) hours as needed.     amLODipine  (NORVASC ) 10 MG tablet Take 1 tablet (10 mg total) by mouth daily. 30 tablet 0   apixaban  (ELIQUIS ) 2.5 MG TABS tablet Take 1 tablet (2.5 mg total) by mouth 2 (two) times daily. 60 tablet 0   carvedilol  (COREG ) 25 MG tablet Take 1 tablet (25 mg total) by mouth 2 (two) times daily with a meal. 60 tablet 1   Cholecalciferol  (VITAMIN D -3) 125 MCG (5000 UT) TABS Take 1 tablet by mouth in the morning.     cinacalcet  (SENSIPAR ) 60 MG tablet Take 60 mg by mouth every dialysis (Tuesday, Thursday, and Saturday).     cloNIDine  (CATAPRES ) 0.1 MG tablet Take 1 tablet (0.1 mg total) by mouth 2 (two) times daily. Do not take it before dialysis     hydrALAZINE  (APRESOLINE ) 100 MG tablet  Take 1 tablet (100 mg total) by mouth every 8 (eight) hours. 270 tablet 3   isosorbide  mononitrate (IMDUR ) 30 MG 24 hr tablet TAKE 1 TABLET (30MG ) ON DIALYSIS DAYS AND 2 TABLETS (60 MG) ON NON DIALYSIS DAYS. 45 tablet 10   LOKELMA  10 g PACK packet Take 10 g by mouth every Monday, Wednesday, and Friday.     naloxone  (NARCAN ) nasal spray 4 mg/0.1 mL Place 1 spray into the nose as needed (opioid overdose).     oxyCODONE -acetaminophen  (PERCOCET) 10-325 MG tablet Take 1 tablet by mouth 4 (four) times daily as needed for pain.     pantoprazole  (PROTONIX ) 40 MG tablet TAKE 1 TABLET (40 MG TOTAL) BY MOUTH 2 (TWO) TIMES DAILY. 60 tablet 1   rosuvastatin  (CRESTOR ) 10 MG tablet TAKE 1 TABLET (10 MG TOTAL) BY MOUTH DAILY FOR CHOLESTEROL 90 tablet 0   senna-docusate (SENOKOT-S) 8.6-50 MG tablet Take 1 tablet by mouth 2 (two) times daily between meals as needed for mild constipation. 180 tablet 0   traZODone  (DESYREL ) 50 MG tablet Take 25-50 mg by mouth at bedtime as needed for sleep.     No current facility-administered medications for this visit.     Physical Exam: BP (!) 170/80   Pulse 76   Resp 20  Ht 5' 3 (1.6 m)   Wt 103 lb (46.7 kg)   SpO2 95% Comment: RA  BMI 18.25 kg/m  She looks well. Cardiac exam shows regular rate and rhythm with normal heart sounds.  There is a 2/6 systolic murmur along the left lower sternal border.  There is no diastolic murmur. The sternotomy scar is well-healed.  Sternum is stable. There is no peripheral edema.   Diagnostic Tests:  Narrative & Impression  CLINICAL DATA:  Several day history of cough, chills, and new shortness of breath with hypoxia   EXAM: CT ANGIOGRAPHY CHEST, ABDOMEN AND PELVIS   TECHNIQUE: Non-contrast CT of the chest was initially obtained.   Multidetector CT imaging through the chest, abdomen and pelvis was performed using the standard protocol during bolus administration of intravenous contrast. Multiplanar reconstructed images  and MIPs were obtained and reviewed to evaluate the vascular anatomy.   RADIATION DOSE REDUCTION: This exam was performed according to the departmental dose-optimization program which includes automated exposure control, adjustment of the mA and/or kV according to patient size and/or use of iterative reconstruction technique.   CONTRAST:  75mL OMNIPAQUE  IOHEXOL  350 MG/ML SOLN   COMPARISON:  CTA chest, abdomen, and pelvis dated 01/14/2023 and multiple priors   FINDINGS: CTA CHEST FINDINGS   Cardiovascular: Right IJ central venous catheter terminates in the right atrium. Preferential opacification of the thoracic aorta. Similar postsurgical changes of the ascending aorta measuring 4.6 x 4.1 cm (7:65, remeasured). No evidence of thoracic aortic dissection. Similar multichamber cardiomegaly. No pericardial effusion. Coronary artery calcifications. 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 Reflux of contrast material into the hepatic veins, suggesting a degree of right heart dysfunction.   Mediastinum/Nodes: Imaged thyroid  gland without nodules meeting criteria for imaging follow-up by size. Normal esophagus. Unchanged 10 mm right hilar lymph node (7:58) and 17 mm subcarinal lymph node (7:62).   Lungs/Pleura: The central airways are patent. Diffuse interlobular septal thickening, right-greater-than-left. Patchy right lower lobe ground-glass opacities. Asymmetric right bronchial wall thickening. No pneumothorax. Unchanged moderate right and trace left pleural effusions.   Musculoskeletal: No acute or abnormal lytic or blastic osseous lesions. Median sternotomy wires are nondisplaced. Multilevel degenerative changes of the thoracic spine. Flowing anterior osteophytes over at least 4 contiguous thoracic vertebrae which can be seen in the setting of diffuse  idiopathic skeletal hyperostosis (DISH).   Review of the MIP images confirms the above findings.   CTA ABDOMEN AND PELVIS FINDINGS   VASCULAR   Severe aortic and branch atherosclerosis. Normal caliber of the abdominal aorta. No evidence of aortic aneurysm, dissection, or acute aortic abnormality.   NON-VASCULAR   Hepatobiliary: No focal hepatic lesions. No intra or extrahepatic biliary ductal dilation. Normal gallbladder.   Pancreas: No focal lesions or main ductal dilation.   Spleen: Normal in size without focal abnormality.   Adrenals/Urinary Tract: No adrenal nodules. Atrophic kidneys without hydronephrosis. 2.6 cm exophytic right upper pole hypodensity (7:157), increased in size from 1.8 cm on 11/18/2021. Simple-appearing cyst in the lower pole left kidney and subcentimeter hyperdense cyst in the upper pole. No specific follow-up imaging recommended. No focal bladder wall thickening.   Stomach/Bowel: Normal appearance of the stomach. No evidence of bowel wall thickening, distention, or inflammatory changes. Colonic diverticulosis without acute diverticulitis. Normal appendix.   Lymphatic: No enlarged abdominal or pelvic lymph nodes.   Reproductive: No adnexal masses. Multifocal coarse calcifications within the  uterus, likely leiomyomata.   Other: Small volume free fluid.  No free air or fluid collection.   Musculoskeletal: No acute or abnormal lytic or blastic osseous lesions. Multilevel degenerative changes of the lumbar spine. Postsurgical changes of L4-5 superior spinous process and L2-3 fusion. Unchanged fat containing left lumbar hernias. Mild body wall edema.   Review of the MIP images confirms the above findings.   IMPRESSION: 1. No evidence of aortic dissection. Similar postsurgical changes of the ascending aorta. 2. Similar multichamber cardiomegaly with asymmetric right pulmonary edema and unchanged moderate right and trace left pleural effusions. 3.  Patchy right lower lobe ground-glass opacities, which may be superimposed infection or inflammation. 4. Unchanged mildly enlarged right hilar and subcarinal lymph nodes, likely reactive. 5. Increased size of a 2.6 cm exophytic right upper pole renal hypodensity. Recommend further nonemergent characterization. While renal protocol MRI or CT are preferred, in the setting of renal failure, renal ultrasound may be pursued first. 6. Aortic Atherosclerosis (ICD10-I70.0). Coronary artery calcifications. Assessment for potential risk factor modification, dietary therapy or pharmacologic therapy may be warranted, if clinically indicated.     Electronically Signed   By: Limin  Xu M.D.   On: 08/21/2023 10:42         ECHOCARDIOGRAM REPORT       Patient Name:   Monique Henderson Date of Exam: 06/03/2023 Medical Rec #:  [id number]          Height:       63.0 in Accession #:    [id number]         Weight:       106.0 lb Date of Birth:  July 03, 1950           BSA:          1.476 m Patient Age:    72 years           BP:           131/83 mmHg Patient Gender: F                  HR:           73 bpm. Exam Location:  Inpatient  Procedure: 2D Echo, Color Doppler, Cardiac Doppler and Strain Analysis  Indications:    acute systolic chf   History:        Patient has prior history of Echocardiogram examinations, most                 recent 06/19/2022. COPD and end stage renal disease. Hepatitis                 C.; Risk Factors:Hypertension and Dyslipidemia.   Sonographer:    Tinnie Barefoot RDCS Referring Phys: [id number] DARRYLE NED O'NEAL    Sonographer Comments: Global longitudinal strain was attempted. IMPRESSIONS    1. Left ventricular ejection fraction, by estimation, is 45%. The left ventricle has mildly decreased function. The left ventricle demonstrates regional wall motion abnormalities with apical akinesis. There is severe concentric left ventricular hypertrophy. 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 Left ventricular diastolic parameters are consistent with Grade II diastolic dysfunction (pseudonormalization). The average left ventricular global longitudinal strain is -3.8 %. The global longitudinal strain is abnormal.  2. Right ventricular systolic function is moderately reduced. The right ventricular size is normal. There is mildly elevated pulmonary artery systolic pressure. The estimated right ventricular systolic pressure is 41.9 mmHg.  3. Left atrial size was moderately dilated.  4. The mitral valve is normal in  structure. Trivial mitral valve regurgitation. No evidence of mitral stenosis.  5. Tricuspid valve regurgitation is mild to moderate.  6. The aortic valve is tricuspid. There is mild calcification of the aortic valve. Aortic valve regurgitation is not visualized. No aortic stenosis is present.  7. Aortic dilatation noted.  8. The inferior vena cava is normal in size with greater than 50% respiratory variability, suggesting right atrial pressure of 3 mmHg.  FINDINGS  Left Ventricle: Left ventricular ejection fraction, by estimation, is 45%. The left ventricle has mildly decreased function. The left ventricle demonstrates regional wall motion abnormalities. The average left ventricular global longitudinal strain is -3.8 %. The global longitudinal strain is abnormal. The left ventricular internal cavity size was normal in size. There is severe concentric left ventricular hypertrophy. Left ventricular diastolic parameters are consistent with Grade II diastolic dysfunction (pseudonormalization).  Right Ventricle: The right ventricular size is normal. No increase in right ventricular wall thickness. Right ventricular systolic function is moderately reduced. There is mildly elevated pulmonary artery systolic pressure. The tricuspid regurgitant velocity is 3.12 m/s, and with an assumed right atrial pressure of 3 mmHg, the estimated right ventricular systolic  pressure is 41.9 mmHg.  Left Atrium: Left atrial size was moderately dilated.  Right Atrium: Right atrial size was normal in size.  Pericardium: There is no evidence of pericardial effusion.  Mitral Valve: The mitral valve is normal in structure. Trivial mitral valve regurgitation. No evidence of mitral valve stenosis.  Tricuspid Valve: The tricuspid valve is normal in structure. Tricuspid valve regurgitation is mild to moderate.  Aortic Valve: The aortic valve is tricuspid. There is mild calcification of the aortic valve. Aortic valve regurgitation is not visualized. No aortic stenosis is present.  Pulmonic Valve: The pulmonic valve was normal in structure. Pulmonic valve regurgitation is not visualized.  Aorta: Aortic dilatation noted.  Venous: The inferior vena cava is normal in size with greater than 50% respiratory variability, suggesting right atrial pressure of 3 mmHg.  IAS/Shunts: No atrial level shunt detected by color flow Doppler.    LEFT VENTRICLE PLAX 2D LVIDd:         4.20 cm   Diastology LVIDs:         3.30 cm   LV e' medial:    5.22 cm/s LV PW:         1.50 cm   LV E/e' medial:  21.8 LV IVS:        1.30 cm   LV e' lateral:   3.70 cm/s LVOT diam:     2.10 cm   LV E/e' lateral: 30.8 LV SV:         50 LV SV Index:   34        2D Longitudinal Strain LVOT Area:     3.46 cm  2D Strain GLS Avg:     -3.8 %    RIGHT VENTRICLE            IVC RV Basal diam:  2.90 cm    IVC diam: 1.50 cm RV S prime:     6.20 cm/s TAPSE (M-mode): 1.1 cm  LEFT ATRIUM           Index        RIGHT ATRIUM           Index LA diam:      4.00 cm 2.71 cm/m   RA Area:     14.60 cm LA Vol (A4C): 59.4 ml 40.23 ml/m  RA Volume:  30.50 ml  20.66 ml/m  AORTIC VALVE LVOT Vmax:   85.20 cm/s LVOT Vmean:  53.400 cm/s LVOT VTI:    0.145 m   AORTA Ao Root diam: 2.70 cm Ao Asc diam:  2.90 cm  MITRAL VALVE                TRICUSPID VALVE MV Area (PHT): 3.99 cm     TR Peak grad:   38.9  mmHg MV Decel Time: 190 msec     TR Vmax:        312.00 cm/s MV E velocity: 114.00 cm/s MV A velocity: 106.00 cm/s  SHUNTS MV E/A ratio:  1.08         Systemic VTI:  0.14 m                             Systemic Diam: 2.10 cm  Dalton McleanMD Electronically signed by Ezra Kanner Signature Date/Time: 06/03/2023/6:04:44 PM       Final     Impression:  This 73 year old woman is almost 5 years out from repair of her aortic dissection.  Echocardiogram shows a normally functioning aortic valve without insufficiency.  CTA of the chest shows a stable surgical repair with no evidence of chronic aortic dissection or aneurysm formation.  I reviewed the images with her and answered all of her questions.  I stressed the importance of continued good blood pressure control in preventing further enlargement and acute aortic dissection.   Plan:  She will continue to follow-up with her PCP and cardiology.  I will be happy to see her back if the need arises.  I spent 15 minutes performing this established patient evaluation and > 50% of this time was spent face to face counseling and coordinating the care of this patient's aortic dissection.    Dorise MARLA Fellers, MD [facility] [phone]

## 2023-12-21 ENCOUNTER — Other Ambulatory Visit: Payer: Self-pay | Admitting: Interventional Cardiology

## 2024-02-14 ENCOUNTER — Emergency Department (HOSPITAL_COMMUNITY)

## 2024-02-14 ENCOUNTER — Inpatient Hospital Stay (HOSPITAL_COMMUNITY)
Admission: EM | Admit: 2024-02-14 | Discharge: 2024-02-16 | DRG: 640 | Discharge status: Against Medical Advice | Attending: Internal Medicine | Admitting: Internal Medicine

## 2024-02-14 ENCOUNTER — Other Ambulatory Visit: Payer: Self-pay

## 2024-02-14 DIAGNOSIS — Z992 Dependence on renal dialysis: Secondary | ICD-10-CM | POA: Diagnosis not present

## 2024-02-14 DIAGNOSIS — I132 Hypertensive heart and chronic kidney disease with heart failure and with stage 5 chronic kidney disease, or end stage renal disease: Secondary | ICD-10-CM | POA: Diagnosis present

## 2024-02-14 DIAGNOSIS — Z7901 Long term (current) use of anticoagulants: Secondary | ICD-10-CM | POA: Diagnosis not present

## 2024-02-14 DIAGNOSIS — Z91199 Patient's noncompliance with other medical treatment and regimen due to unspecified reason: Secondary | ICD-10-CM

## 2024-02-14 DIAGNOSIS — I5042 Chronic combined systolic (congestive) and diastolic (congestive) heart failure: Secondary | ICD-10-CM | POA: Diagnosis present

## 2024-02-14 DIAGNOSIS — E872 Acidosis, unspecified: Secondary | ICD-10-CM | POA: Diagnosis present

## 2024-02-14 DIAGNOSIS — Z87891 Personal history of nicotine dependence: Secondary | ICD-10-CM

## 2024-02-14 DIAGNOSIS — Z888 Allergy status to other drugs, medicaments and biological substances status: Secondary | ICD-10-CM

## 2024-02-14 DIAGNOSIS — Z91040 Latex allergy status: Secondary | ICD-10-CM

## 2024-02-14 DIAGNOSIS — Z86000 Personal history of in-situ neoplasm of breast: Secondary | ICD-10-CM

## 2024-02-14 DIAGNOSIS — I471 Supraventricular tachycardia, unspecified: Secondary | ICD-10-CM | POA: Diagnosis present

## 2024-02-14 DIAGNOSIS — J449 Chronic obstructive pulmonary disease, unspecified: Secondary | ICD-10-CM | POA: Diagnosis present

## 2024-02-14 DIAGNOSIS — I160 Hypertensive urgency: Secondary | ICD-10-CM | POA: Diagnosis present

## 2024-02-14 DIAGNOSIS — Z8679 Personal history of other diseases of the circulatory system: Secondary | ICD-10-CM | POA: Diagnosis not present

## 2024-02-14 DIAGNOSIS — M25552 Pain in left hip: Secondary | ICD-10-CM | POA: Diagnosis present

## 2024-02-14 DIAGNOSIS — R54 Age-related physical debility: Secondary | ICD-10-CM | POA: Diagnosis present

## 2024-02-14 DIAGNOSIS — J9601 Acute respiratory failure with hypoxia: Secondary | ICD-10-CM | POA: Diagnosis present

## 2024-02-14 DIAGNOSIS — Z981 Arthrodesis status: Secondary | ICD-10-CM

## 2024-02-14 DIAGNOSIS — I480 Paroxysmal atrial fibrillation: Secondary | ICD-10-CM | POA: Diagnosis present

## 2024-02-14 DIAGNOSIS — Z886 Allergy status to analgesic agent status: Secondary | ICD-10-CM

## 2024-02-14 DIAGNOSIS — D631 Anemia in chronic kidney disease: Secondary | ICD-10-CM | POA: Diagnosis present

## 2024-02-14 DIAGNOSIS — I69351 Hemiplegia and hemiparesis following cerebral infarction affecting right dominant side: Secondary | ICD-10-CM | POA: Diagnosis not present

## 2024-02-14 DIAGNOSIS — I7111 Aneurysm of the ascending aorta, ruptured: Secondary | ICD-10-CM | POA: Diagnosis not present

## 2024-02-14 DIAGNOSIS — Z8249 Family history of ischemic heart disease and other diseases of the circulatory system: Secondary | ICD-10-CM

## 2024-02-14 DIAGNOSIS — Z833 Family history of diabetes mellitus: Secondary | ICD-10-CM

## 2024-02-14 DIAGNOSIS — I10 Essential (primary) hypertension: Secondary | ICD-10-CM

## 2024-02-14 DIAGNOSIS — E8770 Fluid overload, unspecified: Secondary | ICD-10-CM | POA: Diagnosis present

## 2024-02-14 DIAGNOSIS — Z91013 Allergy to seafood: Secondary | ICD-10-CM | POA: Diagnosis not present

## 2024-02-14 DIAGNOSIS — R079 Chest pain, unspecified: Secondary | ICD-10-CM

## 2024-02-14 DIAGNOSIS — I4510 Unspecified right bundle-branch block: Secondary | ICD-10-CM | POA: Diagnosis present

## 2024-02-14 DIAGNOSIS — E875 Hyperkalemia: Secondary | ICD-10-CM | POA: Diagnosis present

## 2024-02-14 DIAGNOSIS — Z820 Family history of epilepsy and other diseases of the nervous system: Secondary | ICD-10-CM

## 2024-02-14 DIAGNOSIS — Z681 Body mass index (BMI) 19 or less, adult: Secondary | ICD-10-CM

## 2024-02-14 DIAGNOSIS — N186 End stage renal disease: Secondary | ICD-10-CM | POA: Diagnosis present

## 2024-02-14 DIAGNOSIS — Z79899 Other long term (current) drug therapy: Secondary | ICD-10-CM

## 2024-02-14 DIAGNOSIS — Z800 Family history of malignant neoplasm of digestive organs: Secondary | ICD-10-CM

## 2024-02-14 DIAGNOSIS — Z885 Allergy status to narcotic agent status: Secondary | ICD-10-CM

## 2024-02-14 DIAGNOSIS — J810 Acute pulmonary edema: Secondary | ICD-10-CM

## 2024-02-14 DIAGNOSIS — Z853 Personal history of malignant neoplasm of breast: Secondary | ICD-10-CM

## 2024-02-14 DIAGNOSIS — Z9889 Other specified postprocedural states: Secondary | ICD-10-CM | POA: Diagnosis not present

## 2024-02-14 DIAGNOSIS — Z5329 Procedure and treatment not carried out because of patient's decision for other reasons: Secondary | ICD-10-CM | POA: Diagnosis not present

## 2024-02-14 LAB — COMPREHENSIVE METABOLIC PANEL WITH GFR
ALT: 7 U/L (ref 0–44)
AST: 15 U/L (ref 15–41)
Albumin: 4 g/dL (ref 3.5–5.0)
Alkaline Phosphatase: 62 U/L (ref 38–126)
Anion gap: 23 — ABNORMAL HIGH (ref 5–15)
BUN: 87 mg/dL — ABNORMAL HIGH (ref 8–23)
CO2: 18 mmol/L — ABNORMAL LOW (ref 22–32)
Calcium: 9.5 mg/dL (ref 8.9–10.3)
Chloride: 97 mmol/L — ABNORMAL LOW (ref 98–111)
Creatinine, Ser: 12.86 mg/dL — ABNORMAL HIGH (ref 0.44–1.00)
GFR, Estimated: 3 mL/min — ABNORMAL LOW (ref >60)
Glucose, Bld: 107 mg/dL — ABNORMAL HIGH (ref 70–99)
Potassium: 7.3 mmol/L (ref 3.5–5.1)
Sodium: 138 mmol/L (ref 135–145)
Total Bilirubin: 0.9 mg/dL (ref 0.0–1.2)
Total Protein: 7.8 g/dL (ref 6.5–8.1)

## 2024-02-14 LAB — CBC
HCT: 36.2 % (ref 36.0–46.0)
Hemoglobin: 11.1 g/dL — ABNORMAL LOW (ref 12.0–15.0)
MCH: 28.6 pg (ref 26.0–34.0)
MCHC: 30.7 g/dL (ref 30.0–36.0)
MCV: 93.3 fL (ref 80.0–100.0)
Platelets: 224 K/uL (ref 150–400)
RBC: 3.88 MIL/uL (ref 3.87–5.11)
RDW: 18.9 % — ABNORMAL HIGH (ref 11.5–15.5)
WBC: 6.4 K/uL (ref 4.0–10.5)
nRBC: 0 % (ref 0.0–0.2)

## 2024-02-14 LAB — TROPONIN I (HIGH SENSITIVITY)
Troponin I (High Sensitivity): 75 ng/L — ABNORMAL HIGH (ref <18)
Troponin I (High Sensitivity): 75 ng/L — ABNORMAL HIGH (ref <18)

## 2024-02-14 LAB — PROCALCITONIN: Procalcitonin: 1.24 ng/mL

## 2024-02-14 LAB — CBC WITH DIFFERENTIAL/PLATELET
Abs Immature Granulocytes: 0.03 K/uL (ref 0.00–0.07)
Basophils Absolute: 0 K/uL (ref 0.0–0.1)
Basophils Relative: 1 %
Eosinophils Absolute: 0 K/uL (ref 0.0–0.5)
Eosinophils Relative: 0 %
HCT: 40.6 % (ref 36.0–46.0)
Hemoglobin: 12.3 g/dL (ref 12.0–15.0)
Immature Granulocytes: 0 %
Lymphocytes Relative: 5 %
Lymphs Abs: 0.4 K/uL — ABNORMAL LOW (ref 0.7–4.0)
MCH: 28.5 pg (ref 26.0–34.0)
MCHC: 30.3 g/dL (ref 30.0–36.0)
MCV: 94.2 fL (ref 80.0–100.0)
Monocytes Absolute: 0.4 K/uL (ref 0.1–1.0)
Monocytes Relative: 5 %
Neutro Abs: 6.5 K/uL (ref 1.7–7.7)
Neutrophils Relative %: 89 %
Platelets: 195 K/uL (ref 150–400)
RBC: 4.31 MIL/uL (ref 3.87–5.11)
RDW: 19.2 % — ABNORMAL HIGH (ref 11.5–15.5)
WBC: 7.4 K/uL (ref 4.0–10.5)
nRBC: 0 % (ref 0.0–0.2)

## 2024-02-14 LAB — CREATININE, SERUM
Creatinine, Ser: 14.07 mg/dL — ABNORMAL HIGH (ref 0.44–1.00)
GFR, Estimated: 3 mL/min — ABNORMAL LOW (ref >60)

## 2024-02-14 LAB — I-STAT CG4 LACTIC ACID, ED
Lactic Acid, Venous: 0.9 mmol/L (ref 0.5–1.9)
Lactic Acid, Venous: 1.4 mmol/L (ref 0.5–1.9)

## 2024-02-14 LAB — CBG MONITORING, ED: Glucose-Capillary: 93 mg/dL (ref 70–99)

## 2024-02-14 LAB — HEPATITIS B SURFACE ANTIGEN: Hepatitis B Surface Ag: NONREACTIVE

## 2024-02-14 LAB — BRAIN NATRIURETIC PEPTIDE: B Natriuretic Peptide: >4500 pg/mL — ABNORMAL HIGH (ref 0.0–100.0)

## 2024-02-14 MED ORDER — OXYCODONE HCL 5 MG PO TABS
5.0000 mg | ORAL_TABLET | ORAL | Status: DC | PRN
  Administered 2024-02-14 – 2024-02-16 (×4): 5 mg via ORAL
  Filled 2024-02-14 (×4): qty 1

## 2024-02-14 MED ORDER — ISOSORBIDE MONONITRATE ER 30 MG PO TB24: 30.0000 mg | ORAL_TABLET | Freq: Every day | ORAL | Status: DC

## 2024-02-14 MED ORDER — CARVEDILOL 25 MG PO TABS
25.0000 mg | ORAL_TABLET | Freq: Two times a day (BID) | ORAL | Status: DC
  Administered 2024-02-14 – 2024-02-16 (×4): 25 mg via ORAL
  Filled 2024-02-14 (×4): qty 1

## 2024-02-14 MED ORDER — CEFTRIAXONE SODIUM 1 G IJ SOLR
1.0000 g | Freq: Once | INTRAMUSCULAR | Status: AC
  Administered 2024-02-14: 1 g via INTRAVENOUS
  Filled 2024-02-14: qty 10

## 2024-02-14 MED ORDER — HYDRALAZINE HCL 20 MG/ML IJ SOLN
10.0000 mg | Freq: Once | INTRAMUSCULAR | Status: AC
Start: 2024-02-14 — End: 2024-02-14
  Administered 2024-02-14: 10 mg via INTRAVENOUS
  Filled 2024-02-14: qty 1

## 2024-02-14 MED ORDER — HYDRALAZINE HCL 50 MG PO TABS
100.0000 mg | ORAL_TABLET | Freq: Three times a day (TID) | ORAL | Status: DC
Start: 2024-02-14 — End: 2024-02-16
  Administered 2024-02-14 – 2024-02-16 (×5): 100 mg via ORAL
  Filled 2024-02-14 (×5): qty 2

## 2024-02-14 MED ORDER — DOXYCYCLINE HYCLATE 100 MG PO TABS
100.0000 mg | ORAL_TABLET | Freq: Once | ORAL | Status: AC
  Administered 2024-02-14: 100 mg via ORAL
  Filled 2024-02-14: qty 1

## 2024-02-14 MED ORDER — ALBUTEROL SULFATE (2.5 MG/3ML) 0.083% IN NEBU
10.0000 mg | INHALATION_SOLUTION | Freq: Once | RESPIRATORY_TRACT | Status: AC
  Administered 2024-02-14: 10 mg via RESPIRATORY_TRACT
  Filled 2024-02-14: qty 12

## 2024-02-14 MED ORDER — ALBUTEROL SULFATE (2.5 MG/3ML) 0.083% IN NEBU
INHALATION_SOLUTION | RESPIRATORY_TRACT | Status: AC
  Administered 2024-02-14: 10 mg via RESPIRATORY_TRACT
  Filled 2024-02-14: qty 12

## 2024-02-14 MED ORDER — APIXABAN 2.5 MG PO TABS
2.5000 mg | ORAL_TABLET | Freq: Two times a day (BID) | ORAL | Status: DC
  Administered 2024-02-14 – 2024-02-16 (×5): 2.5 mg via ORAL
  Filled 2024-02-14 (×5): qty 1

## 2024-02-14 MED ORDER — AMLODIPINE BESYLATE 10 MG PO TABS
10.0000 mg | ORAL_TABLET | Freq: Every day | ORAL | Status: DC
  Administered 2024-02-15 – 2024-02-16 (×2): 10 mg via ORAL
  Filled 2024-02-14 (×2): qty 1

## 2024-02-14 MED ORDER — SENNOSIDES-DOCUSATE SODIUM 8.6-50 MG PO TABS: 1.0000 | ORAL_TABLET | Freq: Two times a day (BID) | ORAL | Status: DC | PRN

## 2024-02-14 MED ORDER — ISOSORBIDE MONONITRATE ER 60 MG PO TB24
60.0000 mg | ORAL_TABLET | ORAL | Status: DC
  Administered 2024-02-16: 60 mg via ORAL
  Filled 2024-02-14: qty 1

## 2024-02-14 MED ORDER — CINACALCET HCL 30 MG PO TABS
60.0000 mg | ORAL_TABLET | ORAL | Status: DC
  Administered 2024-02-15: 60 mg via ORAL
  Filled 2024-02-14: qty 2

## 2024-02-14 MED ORDER — HYDRALAZINE HCL 25 MG PO TABS
100.0000 mg | ORAL_TABLET | Freq: Once | ORAL | Status: AC
  Administered 2024-02-14: 100 mg via ORAL
  Filled 2024-02-14: qty 4

## 2024-02-14 MED ORDER — INSULIN ASPART 100 UNIT/ML IV SOLN
10.0000 [IU] | Freq: Once | INTRAVENOUS | Status: AC
  Administered 2024-02-14: 10 [IU] via INTRAVENOUS

## 2024-02-14 MED ORDER — ISOSORBIDE MONONITRATE ER 30 MG PO TB24
30.0000 mg | ORAL_TABLET | ORAL | Status: DC
  Administered 2024-02-15: 30 mg via ORAL
  Filled 2024-02-14: qty 1

## 2024-02-14 MED ORDER — HEPARIN SODIUM (PORCINE) 5000 UNIT/ML IJ SOLN: 5000.0000 [IU] | Freq: Three times a day (TID) | INTRAMUSCULAR | Status: DC

## 2024-02-14 MED ORDER — SODIUM ZIRCONIUM CYCLOSILICATE 10 G PO PACK
10.0000 g | PACK | Freq: Once | ORAL | Status: AC
  Administered 2024-02-14: 10 g via ORAL
  Filled 2024-02-14: qty 1

## 2024-02-14 MED ORDER — DEXTROSE 50 % IV SOLN
1.0000 | Freq: Once | INTRAVENOUS | Status: AC
  Administered 2024-02-14: 50 mL via INTRAVENOUS
  Filled 2024-02-14: qty 50

## 2024-02-14 MED ORDER — ROSUVASTATIN CALCIUM 5 MG PO TABS
10.0000 mg | ORAL_TABLET | Freq: Every day | ORAL | Status: DC
  Administered 2024-02-14 – 2024-02-16 (×3): 10 mg via ORAL
  Filled 2024-02-14 (×3): qty 2

## 2024-02-14 MED ORDER — CALCIUM GLUCONATE-NACL 1-0.675 GM/50ML-% IV SOLN
1.0000 g | Freq: Once | INTRAVENOUS | Status: AC
  Administered 2024-02-14: 1000 mg via INTRAVENOUS
  Filled 2024-02-14: qty 50

## 2024-02-14 MED ORDER — HYDROMORPHONE HCL 1 MG/ML IJ SOLN
1.0000 mg | Freq: Once | INTRAMUSCULAR | Status: AC
  Administered 2024-02-14: 1 mg via INTRAVENOUS
  Filled 2024-02-14: qty 1

## 2024-02-14 MED ORDER — AMLODIPINE BESYLATE 5 MG PO TABS
10.0000 mg | ORAL_TABLET | Freq: Once | ORAL | Status: AC
  Administered 2024-02-14: 10 mg via ORAL
  Filled 2024-02-14: qty 2

## 2024-02-14 MED ORDER — HYDRALAZINE HCL 20 MG/ML IJ SOLN
20.0000 mg | Freq: Once | INTRAMUSCULAR | Status: AC
  Administered 2024-02-14: 20 mg via INTRAVENOUS
  Filled 2024-02-14: qty 1

## 2024-02-14 MED ORDER — ISOSORBIDE MONONITRATE ER 30 MG PO TB24
30.0000 mg | ORAL_TABLET | Freq: Every day | ORAL | Status: DC
  Administered 2024-02-14: 30 mg via ORAL
  Filled 2024-02-14: qty 1

## 2024-02-14 MED ORDER — CARVEDILOL 12.5 MG PO TABS
25.0000 mg | ORAL_TABLET | ORAL | Status: AC
  Administered 2024-02-14: 25 mg via ORAL
  Filled 2024-02-14: qty 2

## 2024-02-14 MED ORDER — METOPROLOL TARTRATE 5 MG/5ML IV SOLN
5.0000 mg | Freq: Once | INTRAVENOUS | Status: AC
  Administered 2024-02-14: 5 mg via INTRAVENOUS
  Filled 2024-02-14: qty 5

## 2024-02-14 NOTE — ED Triage Notes (Signed)
 Pt presents via EMS c/o SOB, chest pain, and left hip pain. Pt denies injury. Per EMS, pt saturations in 80s on room air, given 5mg  Albuterol  and placed on nasal canula. Pt converted to NRB due to saturation dropping into 80s on [place].   Pt is on HD tu/thu/Sat schedule without missing per EMS.   Pt has not taken HTN meds in 2 days per EMS.   Per EMS, pt extremely anxious regarding IV placement.

## 2024-02-14 NOTE — ED Notes (Signed)
 Patient transported to Dialysis

## 2024-02-14 NOTE — Progress Notes (Signed)
   02/14/24 1654  Vitals  Temp 98.3 F (36.8 C)  Pulse Rate 91  Resp 19  BP (!) 181/108  SpO2 96 %  O2 Device Nasal Cannula  Weight  (STRETCHER)  Oxygen  Therapy  O2 Flow Rate (L/min) 4 L/min  Post Treatment  Dialyzer Clearance Lightly streaked  Hemodialysis Intake (mL) 0 mL  Liters Processed 77.9  Fluid Removed (mL) 3000 mL  Tolerated HD Treatment Yes   Received patient in bed to unit.  Alert and oriented.  Informed consent signed and in chart.   TX duration:3.25HRS  Patient tolerated well.  Transported back to the room  Alert, without acute distress.  Hand-off given to patient's nurse.   Access used: [facility] Access issues: NONE  Total UF removed: 3L Medication(s) given: NONE    [facility]

## 2024-02-14 NOTE — H&P (Signed)
 History and Physical    Patient: Monique Henderson FMW:[id number] DOB: 29-Mar-1951 DOA: 02/14/2024 DOS: the patient was seen and examined on 02/14/2024 PCP: Shelda Atlas, MD  Patient coming from: Home  Chief Complaint:  Chief Complaint  Patient presents with   Shortness of Breath   HPI: Monique Henderson is a 73 y.o. female with medical history significant of ESRD on HD TThS, frequent admissions for fluids overload CVA c/b R hemiparesis, PAF on Eliquis , active smoker, HFpEF(EF 45% in 06/2023) p/w HTN urgency and SOB 2/2 volume overload.   The patient presented with trouble breathing that started on Saturday morning. The patient reported that the trouble breathing began suddenly while lying in bed, waking the patient up. Following the onset, the patient took all prescribed medications which provided no relief. The patient initially waited before seeking medical attention and eventually came in on Sunday morning at 0200.   In the ED, pt hypertensive and tachypeneic w/o hypoxia. Labs notable for troponin 75, BNP >4500, BUN/Cr 87/12.86, and anion gap 23. EDP consulted nephrology for emergent dialysis and pt admitted to medicine.    Review of Systems: As mentioned in the history of present illness. All other systems reviewed and are negative. Past Medical History:  Diagnosis Date   Acute cardioembolic stroke (HCC) 02/26/2022   AF (paroxysmal atrial fibrillation) (HCC) 05/29/2019   on Coumadin    Aortic atherosclerosis (HCC) 07/05/2019   Aortic dissection (HCC) 04/04/2019   s/p repair   Bone spur 2008   Right calcaneal foot spur   Breast cancer (HCC) 2004   Ductal carcinoma in situ of the left breast; S/P left partial mastectomy 02/26/2003; S/P re-excision of cranial and lateral margins11/18/2004.radiation   Carotid artery disease (HCC)    Cerebral thrombosis with cerebral infarction 05/22/2019   Chronic HFrEF (heart failure with reduced ejection fraction) (HCC)    Chronic low back pain  06/22/2016   Chronic obstructive lung disease (HCC) 01/16/2017   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 Deward Null; S/P re-excision of superior and inferior margins 10/27/2012.    Dissection of aorta (HCC) 04/03/2019   ESRD on hemodialysis (HCC) 05/29/2019   Essential hypertension 09/16/2006   GERD 09/16/2006   Hepatitis C    treated and RNA confirmed not detectable 01/2017   Insomnia 03/14/2015   Malnutrition of moderate degree 05/19/2019   Non compliance with medical treatment 12/04/2017   Normocytic anemia    With thrombocytosis   Osteoarthritis    Right ureteral stone 2002   S/P lumbar spinal fusion 01/18/2014   S/P lumbar decompressive laminectomy, fusion, and plating for lumbar spinal stenosis on 05/27/2009 by Dr. Alm CANDIE Molt.  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 Alm CANDIE Molt.     Stroke (cerebrum) (HCC)    Stroke (HCC) 02/23/2022   SVT (supraventricular tachycardia) (HCC) 08/28/2019   Tobacco use disorder 04/19/2009   Uterine fibroid    Wears dentures    top   Past Surgical History:  Procedure Laterality Date   ANTERIOR LAT LUMBAR FUSION N/A 01/18/2014   Procedure: ANTERIOR LATERAL LUMBAR FUSION LUMBAR TWO-THREE;  Surgeon: Alm GORMAN Molt, MD;  Location: [facility];  Service: Neurosurgery;  Laterality: N/A;   ANTERIOR LUMBAR FUSION  01/18/2014   AV FISTULA PLACEMENT Left 04/20/2019  Procedure: ARTERIOVENOUS (AV) FISTULA CREATION;  Surgeon: Sheree Penne Bruckner, MD;  Location: [facility] OR;  Service: Vascular;  Laterality: Left;   BACK SURGERY     BREAST LUMPECTOMY Left 01/2003   BREAST LUMPECTOMY Right 2014   BREAST LUMPECTOMY WITH NEEDLE LOCALIZATION AND AXILLARY SENTINEL LYMPH NODE BX Right 10/13/2012   Procedure:  BREAST LUMPECTOMY WITH NEEDLE LOCALIZATION;  Surgeon: Deward GORMAN Curvin DOUGLAS, MD;  Location: [facility];  Service: General;  Laterality: Right;  Right breast wire localized lumpectomy   DIALYSIS/PERMA CATHETER INSERTION N/A 05/28/2023   Procedure: DIALYSIS/PERMA CATHETER INSERTION;  Surgeon: Melia Lynwood ORN, MD;  Location: [facility];  Service: Cardiovascular;  Laterality: N/A;   INSERTION OF DIALYSIS CATHETER Right 04/20/2019   Procedure: INSERTION OF DIALYSIS CATHETER, right internal jugular;  Surgeon: Sheree Penne Bruckner, MD;  Location: [facility] OR;  Service: Vascular;  Laterality: Right;   INSERTION OF DIALYSIS CATHETER Right 09/24/2020   Procedure: INSERTION OF TUNNELED DIALYSIS CATHETER;  Surgeon: Oris Krystal FALCON, MD;  Location: [facility] OR;  Service: Vascular;  Laterality: Right;   IR FLUORO GUIDE CV LINE RIGHT  09/22/2020   IR THORACENTESIS ASP PLEURAL SPACE W/IMG GUIDE  05/19/2019   IR US  GUIDE VASC ACCESS LEFT  09/22/2020   IR US  GUIDE VASC ACCESS RIGHT  09/22/2020   IR VENOCAVAGRAM SVC  09/22/2020   LAMINECTOMY  05/27/2009   Lumbar decompressive laminectomy, fusion and plating for lumbar spinal stensosis   LIGATION OF ARTERIOVENOUS  FISTULA Left 09/24/2020   Procedure: LIGATION OF LEFT ARM ARTERIOVENOUS  FISTULA;  Surgeon: Oris Krystal FALCON, MD;  Location: [facility] OR;  Service: Vascular;  Laterality: Left;   LUMBAR LAMINECTOMY/DECOMPRESSION MICRODISCECTOMY Left 03/23/2013   Procedure: LUMBAR LAMINECTOMY/DECOMPRESSION MICRODISCECTOMY 1 LEVEL;  Surgeon: Alm GORMAN Molt, MD;  Location: [facility];  Service: Neurosurgery;  Laterality: Left;  LUMBAR LAMINECTOMY/DECOMPRESSION MICRODISCECTOMY 1 LEVEL   MASTECTOMY, PARTIAL Left 02/26/2003   ; S/P re-excision of cranial and lateral margins 04/19/2003.    RE-EXCISION OF BREAST CANCER,SUPERIOR MARGINS Right 10/27/2012   Procedure: RE-EXCISION OF BREAST CANCER,SUPERIOR and inferior MARGINS;  Surgeon: Deward GORMAN Curvin DOUGLAS, MD;  Location: [facility];  Service: General;   Laterality: Right;   RE-EXCISION OF BREAST LUMPECTOMY Left 04/2003   TEE WITHOUT CARDIOVERSION N/A 04/04/2019   Procedure: Transesophageal Echocardiogram (Tee);  Surgeon: German Bartlett PEDLAR, MD;  Location: [facility] OR;  Service: Open Heart Surgery;  Laterality: N/A;   THORACIC AORTIC ANEURYSM REPAIR N/A 04/04/2019   Procedure: THORACIC ASCENDING ANEURYSM REPAIR (AAA)  USING 28 MM X 30 CM HEMASHIELD PLATINUM VASCULAR GRAFT;  Surgeon: German Bartlett PEDLAR, MD;  Location: [facility] OR;  Service: Open Heart Surgery;  Laterality: N/A;   Social History:  reports that she quit smoking about 3 years ago. Her smoking use included cigarettes. She started smoking about 47 years ago. She has a 11 pack-year smoking history. She has never used smokeless tobacco. She reports that she does not currently use alcohol  after a past usage of about 2.0 standard drinks of alcohol  per week. She reports that she does not currently use drugs after having used the following drugs: Cocaine .  Allergies  Allergen Reactions   Shrimp [Shellfish Allergy] Shortness Of Breath   Bactroban  [Mupirocin ] Other (See Comments)    Sores in nose   Hydrocodone  Itching, Nausea And Vomiting and Nausea Only   Chlorhexidine  Gluconate Itching   Latex Itching    Latex gloves cause itchiness and a rash   Tylenol  [Acetaminophen ] Itching  Takes Percocet at home 05/06/23   Zestril  [Lisinopril ] Cough    Family History  Problem Relation Age of Onset   Colon cancer Mother 58   Hypertension Mother    Diabetes Sister 45   Hypertension Sister    Diabetes Brother    Hypertension Brother    Diabetes Brother    Hypertension Brother    Kidney disease Son        s/p renal transplant   Hypertension Son    Diabetes Son    Multiple sclerosis Son    Bone cancer Sister 37   Breast cancer Neg Hx    Cervical cancer Neg Hx     Prior to Admission medications   Medication Sig Start Date End Date Taking? Authorizing Provider  acetaminophen  (TYLENOL ) 325 MG tablet  Take 650 mg by mouth every 6 (six) hours as needed for mild pain (pain score 1-3) or moderate pain (pain score 4-6).    [provider]  albuterol  (PROVENTIL ) (2.5 MG/3ML) 0.083% nebulizer solution Take 3 mLs (2.5 mg total) by nebulization every 6 (six) hours as needed for wheezing or shortness of breath. 08/23/23   Pokhrel, Laxman, MD  albuterol  (VENTOLIN  HFA) 108 (90 Base) MCG/ACT inhaler Inhale 2 puffs into the lungs every 6 (six) hours as needed. 10/02/23   [provider]  amLODipine  (NORVASC ) 10 MG tablet Take 1 tablet (10 mg total) by mouth daily. 03/06/22   Setzer, Sandra J, PA-C  apixaban  (ELIQUIS ) 2.5 MG TABS tablet Take 1 tablet (2.5 mg total) by mouth 2 (two) times daily. 03/06/22   Setzer, Sandra J, PA-C  carvedilol  (COREG ) 25 MG tablet Take 1 tablet (25 mg total) by mouth 2 (two) times daily with a meal. 05/01/22 06/16/24  Ghimire, Donalda HERO, MD  Cholecalciferol  (VITAMIN D -3) 125 MCG (5000 UT) TABS Take 1 tablet by mouth in the morning.    [provider]  cinacalcet  (SENSIPAR ) 60 MG tablet Take 60 mg by mouth every dialysis (Tuesday, Thursday, and Saturday).    [provider]  cloNIDine  (CATAPRES ) 0.1 MG tablet Take 1 tablet (0.1 mg total) by mouth 2 (two) times daily. Do not take it before dialysis 09/02/23   Fairy Frames, MD  hydrALAZINE  (APRESOLINE ) 100 MG tablet Take 1 tablet (100 mg total) by mouth every 8 (eight) hours. 12/18/22   Dann Candyce RAMAN, MD  isosorbide  mononitrate (IMDUR ) 30 MG 24 hr tablet TAKE 1 TABLET (30MG ) ON DIALYSIS DAYS AND 2 TABLETS (60 MG) ON NON DIALYSIS DAYS. 12/22/23   Michele, Sunit, DO  LOKELMA  10 g PACK packet Take 10 g by mouth every Monday, Wednesday, and Friday. 07/30/23   [provider]  naloxone  (NARCAN ) nasal spray 4 mg/0.1 mL Place 1 spray into the nose as needed (opioid overdose). 02/19/22   [provider]  oxyCODONE -acetaminophen  (PERCOCET) 10-325 MG tablet Take 1 tablet by mouth 4 (four) times  daily as needed for pain.    [provider]  pantoprazole  (PROTONIX ) 40 MG tablet TAKE 1 TABLET (40 MG TOTAL) BY MOUTH 2 (TWO) TIMES DAILY. 05/13/23   Tanda Bleacher, MD  rosuvastatin  (CRESTOR ) 10 MG tablet TAKE 1 TABLET (10 MG TOTAL) BY MOUTH DAILY FOR CHOLESTEROL 08/07/22   Tanda Bleacher, MD  senna-docusate (SENOKOT-S) 8.6-50 MG tablet Take 1 tablet by mouth 2 (two) times daily between meals as needed for mild constipation. 08/14/22   Gonfa, Taye T, MD  traZODone  (DESYREL ) 50 MG tablet Take 25-50 mg by mouth at bedtime as needed for  sleep. 03/19/23   [provider]    Physical Exam: Vitals:   02/14/24 0630 02/14/24 0700 02/14/24 0715 02/14/24 0730  BP: (!) 191/91 (!) 184/87 (!) 180/90 (!) 186/101  Pulse:      Resp: (!) 28 (!) 21 (!) 32 (!) 28  Temp:    98 F (36.7 C)  TempSrc:    Oral  SpO2:    93%   General: Alert, oriented x3, resting comfortably in no acute distress Respiratory: Lungs clear to auscultation bilaterally with normal respiratory effort; no w/r/r Cardiovascular: Regular rate and rhythm w/o m/r/g   Data Reviewed:  Lab Results  Component Value Date   WBC 6.4 02/14/2024   HGB 11.1 (L) 02/14/2024   HCT 36.2 02/14/2024   MCV 93.3 02/14/2024   PLT 224 02/14/2024   Lab Results  Component Value Date   GLUCOSE 107 (H) 02/14/2024   CALCIUM  9.5 02/14/2024   NA 138 02/14/2024   K 7.3 (HH) 02/14/2024   CO2 18 (L) 02/14/2024   CL 97 (L) 02/14/2024   BUN 87 (H) 02/14/2024   CREATININE 14.07 (H) 02/14/2024   Lab Results  Component Value Date   ALT 7 02/14/2024   AST 15 02/14/2024   GGT 26 12/09/2016   ALKPHOS 62 02/14/2024   BILITOT 0.9 02/14/2024   Lab Results  Component Value Date   INR 1.1 06/25/2023   INR 1.1 06/02/2023   INR 1.2 09/25/2022   Radiology: [facility] Chest Port 1 View Result Date: 02/14/2024 EXAM: 1 VIEW XRAY OF THE CHEST 02/14/2024 03:11:00 AM COMPARISON: 10/28/2023 CLINICAL HISTORY: Shortness of breath. FINDINGS: LUNGS AND  PLEURA: Lungs are well expanded. Mild interstitial pulmonary edema is present. Superimposed airspace infiltrate is seen at the right lung base which may represent asymmetric pulmonary edema or superimposed infectious process. Trace left pleural effusion. No pneumothorax. HEART AND MEDIASTINUM: Stable moderate cardiomegaly. Median sternotomy has been performed. BONES AND SOFT TISSUES: No acute bone abnormality. LINES, TUBES AND DEVICES: Right internal jugular hemodialysis catheter tip seen within the right atrium, unchanged. IMPRESSION: 1. Mild interstitial pulmonary edema and superimposed right basilar airspace infiltrate, possibly representing asymmetric pulmonary edema or superimposed infectious process. 2. Trace left pleural effusion. 3. Stable moderate cardiomegaly. Electronically signed by: Dorethia Molt MD 02/14/2024 03:18 AM EDT RP Workstation: [id number]    Assessment and Plan: 32F h/o ESRD on HD TThS, frequent admissions for fluids overload CVA c/b R hemiparesis, PAF on Eliquis , active smoker, HFpEF(EF 45% in 06/2023) p/w HTN urgency and SOB 2/2 volume overload.   HTN urgency SOB Volume overload H/o ESRD Hyperkalemia AGMA High suspicion for volume overload from dietary indiscretion vs poor HD compliance; pt reports going to all 3 HD sessions this week -Renal consulted; apprec eval/recs -PTA cincalcet -S/p lokelma  10mg  x1 in ED  AFRVR -Cards consulted in ED; apprec recs (NOTE: recommended IV metoprolol  5mg  x1 followed by pta Coreg  25mg  BID) -PTA Coreg  25mg  BID  HTN -PTA Coreg , amlodipine , Imdur  and hydralazine  -HOLD pta clonidine  for now  PAF -PTA Coreg  and Eliquis     Advance Care Planning:   Code Status: Full Code   Consults: Renal  Family Communication: N/A  Severity of Illness: The appropriate patient status for this patient is INPATIENT. Inpatient status is judged to be reasonable and necessary in order to provide the required intensity of service to ensure the  patient's safety. The patient's presenting symptoms, physical exam findings, and initial radiographic and laboratory data in the context of their chronic comorbidities is felt  to place them at high risk for further clinical deterioration. Furthermore, it is not anticipated that the patient will be medically stable for discharge from the hospital within 2 midnights of admission.   * I certify that at the point of admission it is my clinical judgment that the patient will require inpatient hospital care spanning beyond 2 midnights from the point of admission due to high intensity of service, high risk for further deterioration and high frequency of surveillance required.*   ------- I spent 60 minutes reviewing previous notes, at the bedside counseling/discussing the treatment plan, and performing clinical documentation.  Author: Marsha Ada, MD 02/14/2024 7:53 AM  For on call review [email or web address].

## 2024-02-14 NOTE — Consult Note (Addendum)
 Cardiology Consultation   Patient ID: BERDELLA BACOT MRN: [id number]; DOB: 06/05/1950  Admit date: 02/14/2024 Date of Consult: 02/14/2024  PCP:  Shelda Atlas, MD   [facility] Providers Cardiologist:  Madonna Large, DO        Patient Profile: Monique Henderson is a 73 y.o. female with a hx of PAF on Eliquis  2.5 mg bid, SVT, HTN, Ao dissection>> repair, CVA, COPD, ESRD on HD, GERD, OA, who is being seen 02/14/2024 for the evaluation of CP/SOB and arrhythmia at the request of Dr Georgina.  History of Present Illness: Monique Henderson last had HD on 09/13, came to the ER early 09/15 w/ SOB and hip pain, O2 sats low 80s, BP 215/117, K+ 7.3, Cr 12.8.   After multiple meds and O2, condition improved. HR elevated, ?SVT vs flutter >> Cards asked to see.   Monique Henderson says she gets this kind of chest pain sometimes when she needs dialysis really bad.  The pain can reach a 10/10.  It is worse with deep inspiration and with chest palpation.  She denies a history of exertional chest pain.  The chest pain she was having when she came in is across the lower edge of her ribs, at about the bra line.  This area is tender to palpation.  She is breathing some better than on admission, and the pain has improved, but is still significant.  She has just been started on dialysis, and is on oxygen , her heart rate is still elevated, but O2 sats have improved to 95%.  As she has been on HD for a little while now, her chest pain has improved.    Past Medical History:  Diagnosis Date   Acute cardioembolic stroke (HCC) 02/26/2022   AF (paroxysmal atrial fibrillation) (HCC) 05/29/2019   on Coumadin    Aortic atherosclerosis (HCC) 07/05/2019   Aortic dissection (HCC) 04/04/2019   s/p repair   Bone spur 2008   Right calcaneal foot spur   Breast cancer (HCC) 2004   Ductal carcinoma in situ of the left breast; S/P left partial mastectomy 02/26/2003; S/P re-excision of cranial and lateral  margins11/18/2004.radiation   Carotid artery disease (HCC)    Cerebral thrombosis with cerebral infarction 05/22/2019   Chronic HFrEF (heart failure with reduced ejection fraction) (HCC)    Chronic low back pain 06/22/2016   Chronic obstructive lung disease (HCC) 01/16/2017   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 Deward Null; S/P re-excision of superior and inferior margins 10/27/2012.    Dissection of aorta (HCC) 04/03/2019   ESRD on hemodialysis (HCC) 05/29/2019   Essential hypertension 09/16/2006   GERD 09/16/2006   Hepatitis C    treated and RNA confirmed not detectable 01/2017   Insomnia 03/14/2015   Malnutrition of moderate degree 05/19/2019   Non compliance with medical treatment 12/04/2017   Normocytic anemia    With thrombocytosis   Osteoarthritis    Right ureteral stone 2002   S/P lumbar spinal fusion 01/18/2014   S/P lumbar decompressive laminectomy, fusion, and plating for lumbar spinal stenosis on 05/27/2009 by Dr. Alm CANDIE Molt.  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 Alm CANDIE Molt.     Stroke (cerebrum) [facility])    Stroke (HCC) 02/23/2022   SVT (supraventricular tachycardia) (HCC) 08/28/2019  Tobacco use disorder 04/19/2009   Uterine fibroid    Wears dentures    top    Past Surgical History:  Procedure Laterality Date   ANTERIOR LAT LUMBAR FUSION N/A 01/18/2014   Procedure: ANTERIOR LATERAL LUMBAR FUSION LUMBAR TWO-THREE;  Surgeon: Alm GORMAN Molt, MD;  Location: [facility];  Service: Neurosurgery;  Laterality: N/A;   ANTERIOR LUMBAR FUSION  01/18/2014   AV FISTULA PLACEMENT Left 04/20/2019   Procedure: ARTERIOVENOUS (AV) FISTULA CREATION;  Surgeon: Sheree Penne Bruckner, MD;  Location: [facility] OR;  Service:  Vascular;  Laterality: Left;   BACK SURGERY     BREAST LUMPECTOMY Left 01/2003   BREAST LUMPECTOMY Right 2014   BREAST LUMPECTOMY WITH NEEDLE LOCALIZATION AND AXILLARY SENTINEL LYMPH NODE BX Right 10/13/2012   Procedure: BREAST LUMPECTOMY WITH NEEDLE LOCALIZATION;  Surgeon: Deward GORMAN Curvin DOUGLAS, MD;  Location: [facility];  Service: General;  Laterality: Right;  Right breast wire localized lumpectomy   DIALYSIS/PERMA CATHETER INSERTION N/A 05/28/2023   Procedure: DIALYSIS/PERMA CATHETER INSERTION;  Surgeon: Melia Lynwood ORN, MD;  Location: [facility];  Service: Cardiovascular;  Laterality: N/A;   INSERTION OF DIALYSIS CATHETER Right 04/20/2019   Procedure: INSERTION OF DIALYSIS CATHETER, right internal jugular;  Surgeon: Sheree Penne Bruckner, MD;  Location: [facility] OR;  Service: Vascular;  Laterality: Right;   INSERTION OF DIALYSIS CATHETER Right 09/24/2020   Procedure: INSERTION OF TUNNELED DIALYSIS CATHETER;  Surgeon: Oris Krystal FALCON, MD;  Location: [facility] OR;  Service: Vascular;  Laterality: Right;   IR FLUORO GUIDE CV LINE RIGHT  09/22/2020   IR THORACENTESIS ASP PLEURAL SPACE W/IMG GUIDE  05/19/2019   IR US  GUIDE VASC ACCESS LEFT  09/22/2020   IR US  GUIDE VASC ACCESS RIGHT  09/22/2020   IR VENOCAVAGRAM SVC  09/22/2020   LAMINECTOMY  05/27/2009   Lumbar decompressive laminectomy, fusion and plating for lumbar spinal stensosis   LIGATION OF ARTERIOVENOUS  FISTULA Left 09/24/2020   Procedure: LIGATION OF LEFT ARM ARTERIOVENOUS  FISTULA;  Surgeon: Oris Krystal FALCON, MD;  Location: [facility] OR;  Service: Vascular;  Laterality: Left;   LUMBAR LAMINECTOMY/DECOMPRESSION MICRODISCECTOMY Left 03/23/2013   Procedure: LUMBAR LAMINECTOMY/DECOMPRESSION MICRODISCECTOMY 1 LEVEL;  Surgeon: Alm GORMAN Molt, MD;  Location: [facility];  Service: Neurosurgery;  Laterality: Left;  LUMBAR LAMINECTOMY/DECOMPRESSION MICRODISCECTOMY 1 LEVEL   MASTECTOMY, PARTIAL Left 02/26/2003   ; S/P re-excision of cranial and lateral  margins 04/19/2003.    RE-EXCISION OF BREAST CANCER,SUPERIOR MARGINS Right 10/27/2012   Procedure: RE-EXCISION OF BREAST CANCER,SUPERIOR and inferior MARGINS;  Surgeon: Deward GORMAN Curvin DOUGLAS, MD;  Location: [facility] OR;  Service: General;  Laterality: Right;   RE-EXCISION OF BREAST LUMPECTOMY Left 04/2003   TEE WITHOUT CARDIOVERSION N/A 04/04/2019   Procedure: Transesophageal Echocardiogram (Tee);  Surgeon: German Bartlett PEDLAR, MD;  Location: [facility] OR;  Service: Open Heart Surgery;  Laterality: N/A;   THORACIC AORTIC ANEURYSM REPAIR N/A 04/04/2019   Procedure: THORACIC ASCENDING ANEURYSM REPAIR (AAA)  USING 28 MM X 30 CM HEMASHIELD PLATINUM VASCULAR GRAFT;  Surgeon: German Bartlett PEDLAR, MD;  Location: [facility] OR;  Service: Open Heart Surgery;  Laterality: N/A;     Home Medications:  Prior to Admission medications   Medication Sig Start Date End Date Taking? Authorizing Provider  acetaminophen  (TYLENOL ) 325 MG tablet Take 650 mg by mouth every 6 (six) hours as needed for mild pain (pain score 1-3) or moderate pain (pain score 4-6).    [provider]  albuterol  (PROVENTIL ) (2.5  MG/3ML) 0.083% nebulizer solution Take 3 mLs (2.5 mg total) by nebulization every 6 (six) hours as needed for wheezing or shortness of breath. 08/23/23   Pokhrel, Vernal, MD  albuterol  (VENTOLIN  HFA) 108 (90 Base) MCG/ACT inhaler Inhale 2 puffs into the lungs every 6 (six) hours as needed. 10/02/23   [provider]  amLODipine  (NORVASC ) 10 MG tablet Take 1 tablet (10 mg total) by mouth daily. 03/06/22   Setzer, Sandra J, PA-C  apixaban  (ELIQUIS ) 2.5 MG TABS tablet Take 1 tablet (2.5 mg total) by mouth 2 (two) times daily. 03/06/22   Setzer, Sandra J, PA-C  carvedilol  (COREG ) 25 MG tablet Take 1 tablet (25 mg total) by mouth 2 (two) times daily with a meal. 05/01/22 06/16/24  Ghimire, Donalda HERO, MD  Cholecalciferol  (VITAMIN D -3) 125 MCG (5000 UT) TABS Take 1 tablet by mouth in the morning.    [provider]  cinacalcet  (SENSIPAR )  60 MG tablet Take 60 mg by mouth every dialysis (Tuesday, Thursday, and Saturday).    [provider]  cloNIDine  (CATAPRES ) 0.1 MG tablet Take 1 tablet (0.1 mg total) by mouth 2 (two) times daily. Do not take it before dialysis 09/02/23   Fairy Frames, MD  hydrALAZINE  (APRESOLINE ) 100 MG tablet Take 1 tablet (100 mg total) by mouth every 8 (eight) hours. 12/18/22   Dann Candyce RAMAN, MD  isosorbide  mononitrate (IMDUR ) 30 MG 24 hr tablet TAKE 1 TABLET (30MG ) ON DIALYSIS DAYS AND 2 TABLETS (60 MG) ON NON DIALYSIS DAYS. 12/22/23   Tolia, Sunit, DO  LOKELMA  10 g PACK packet Take 10 g by mouth every Monday, Wednesday, and Friday. 07/30/23   [provider]  naloxone  (NARCAN ) nasal spray 4 mg/0.1 mL Place 1 spray into the nose as needed (opioid overdose). 02/19/22   [provider]  oxyCODONE -acetaminophen  (PERCOCET) 10-325 MG tablet Take 1 tablet by mouth 4 (four) times daily as needed for pain.    [provider]  pantoprazole  (PROTONIX ) 40 MG tablet TAKE 1 TABLET (40 MG TOTAL) BY MOUTH 2 (TWO) TIMES DAILY. 05/13/23   Tanda Bleacher, MD  rosuvastatin  (CRESTOR ) 10 MG tablet TAKE 1 TABLET (10 MG TOTAL) BY MOUTH DAILY FOR CHOLESTEROL 08/07/22   Tanda Bleacher, MD  senna-docusate (SENOKOT-S) 8.6-50 MG tablet Take 1 tablet by mouth 2 (two) times daily between meals as needed for mild constipation. 08/14/22   Gonfa, Taye T, MD  traZODone  (DESYREL ) 50 MG tablet Take 25-50 mg by mouth at bedtime as needed for sleep. 03/19/23   [provider]    Scheduled Meds:  [START ON 02/15/2024] amLODipine   10 mg Oral Daily   apixaban   2.5 mg Oral BID   carvedilol   25 mg Oral BID WC   [START ON 02/15/2024] cinacalcet   60 mg Oral Q T,Th,Sat-1800   hydrALAZINE   100 mg Oral Q8H   [START ON 02/15/2024] isosorbide  mononitrate  30 mg Oral Once per day on Tuesday Thursday Saturday   [START ON 02/16/2024] isosorbide  mononitrate  60 mg Oral Once per day on Sunday Monday Wednesday Friday    rosuvastatin   10 mg Oral Daily   Continuous Infusions:  PRN Meds: oxyCODONE , senna-docusate  Allergies:    Allergies  Allergen Reactions   Shrimp [Shellfish Allergy] Shortness Of Breath   Bactroban  [Mupirocin ] Other (See Comments)    Sores in nose   Hydrocodone  Itching, Nausea And Vomiting and Nausea Only   Chlorhexidine  Gluconate Itching   Latex Itching    Latex gloves cause itchiness and  a rash   Tylenol  [Acetaminophen ] Itching    Takes Percocet at home 05/06/23   Zestril  [Lisinopril ] Cough    Social History:   Social History   Socioeconomic History   Marital status: Single    Spouse name: Not on file   Number of children: 2   Years of education: Not on file   Highest education level: Not on file  Occupational History   Occupation: disabled  Tobacco Use   Smoking status: Former    Current packs/day: 0.00    Average packs/day: 0.3 packs/day for 44.0 years (11.0 ttl pk-yrs)    Types: Cigarettes    Start date: 44    Quit date: 2022    Years since quitting: 3.7   Smokeless tobacco: Never   Tobacco comments:    Former smoker 04/21/23  Vaping Use   Vaping status: Never Used  Substance and Sexual Activity   Alcohol  use: Not Currently    Alcohol /week: 2.0 standard drinks of alcohol     Types: 2 Cans of beer per week    Comment: couple beers a weekend, not currently   Drug use: Not Currently    Types: Cocaine     Comment: last in 2005   Sexual activity: Yes    Birth control/protection: Post-menopausal  Other Topics Concern   Not on file  Social History Narrative   Not on file   Social Drivers of Health   Financial Resource Strain: Low Risk  (04/14/2021)   Received from [facility]   Overall Financial Resource Strain (CARDIA)    Difficulty of Paying Living Expenses: Not hard at all  Food Insecurity: No Food Insecurity (10/28/2023)   Hunger Vital Sign    Worried About Running Out of Food in the Last Year: Never true    Ran Out of Food in the Last Year:  Never true  Transportation Needs: No Transportation Needs (10/28/2023)   PRAPARE - Administrator, Civil Service (Medical): No    Lack of Transportation (Non-Medical): No  Physical Activity: Inactive (04/14/2021)   Received from [facility]   Exercise Vital Sign    On average, how many days per week do you engage in moderate to strenuous exercise (like a brisk walk)?: 0 days    On average, how many minutes do you engage in exercise at this level?: 0 min  Stress: Stress Concern Present (04/14/2021)   Received from [facility] - Occupational Stress Questionnaire    Feeling of Stress : Very much  Social Connections: Moderately Isolated (10/28/2023)   Social Connection and Isolation Panel    Frequency of Communication with Friends and Family: Twice a week    Frequency of Social Gatherings with Friends and Family: Twice a week    Attends Religious Services: More than 4 times per year    Active Member of [employer] or Organizations: No    Attends Banker Meetings: Never    Marital Status: Never married  Intimate Partner Violence: Not At Risk (10/28/2023)   Humiliation, Afraid, Rape, and Kick questionnaire    Fear of Current or Ex-Partner: No    Emotionally Abused: No    Physically Abused: No    Sexually Abused: No    Family History:   Family History  Problem Relation Age of Onset   Colon cancer Mother 27   Hypertension Mother    Diabetes Sister 33   Hypertension Sister    Diabetes Brother  Hypertension Brother    Diabetes Brother    Hypertension Brother    Kidney disease Son        s/p renal transplant   Hypertension Son    Diabetes Son    Multiple sclerosis Son    Bone cancer Sister 37   Breast cancer Neg Hx    Cervical cancer Neg Hx      ROS:  Please see the history of present illness.  All other ROS reviewed and negative.     Physical Exam/Data: Vitals:   02/14/24 1100 02/14/24 1115 02/14/24 1130  02/14/24 1145  BP: (!) 129/99 124/86 (!) 143/90 (!) 141/92  Pulse:      Resp: 17 (!) 27 (!) 22 20  Temp: 98 F (36.7 C)     TempSrc: Oral     SpO2: 96%      No intake or output data in the 24 hours ending 02/14/24 1211    12/01/2023    4:29 PM 11/12/2023    8:53 AM 11/12/2023    8:23 AM  Last 3 Weights  Weight (lbs) 103 lb 103 lb 6.4 oz 103 lb 6.4 oz  Weight (kg) 46.72 kg 46.902 kg 46.902 kg     There is no height or weight on file to calculate BMI.  General:  Well nourished, well developed, in acute respiratory distress, almost gasping for breath HEENT: normal Neck: elevated JVD, difficult to assess because patient's respirations are so labored. Vascular: No carotid bruits; Distal pulses 2+ bilaterally Cardiac:  normal S1, S2; rapid but RRR; 2/6 murmur  Lungs:  clear to auscultation anteriorly, unable to move patient because of PermCath right shoulder, no wheezing, rhonchi or rales  Abd: soft, nontender, no hepatomegaly, + chest wall tenderness across her lower ribs Ext: no edema Musculoskeletal:  No deformities, BUE and BLE strength normal and equal Skin: warm and dry  Neuro:  CNs 2-12 intact, no focal abnormalities noted Psych:  Normal affect   EKG:  The EKG was personally reviewed and demonstrates:  sinus tach, HR  118 at 11:14 am Read at Junctional tach but feel ST w/ HR 140 at 7:54 am SR w/ HR 86 at 3:56 am All w/ RBBB and notched QRS, which is old Telemetry:  Telemetry was personally reviewed and demonstrates: Believe this to be sinus tach  Relevant CV Studies:  MYOCARDIAL AMYLOID IMAGING: 07/19/2023    Myocardial uptake was negative for radiotracer uptake. The visual grade of myocardial uptake relative to the ribs was Grade 0 (No myocardial uptake and normal bone uptake).   Findings are not suggestive (Grade 0) of cardiac ATTR amyloidosis.  ECHO: 06/03/2023 Sonographer Comments: Global longitudinal strain was attempted.  IMPRESSIONS   1. Left ventricular ejection  fraction, by estimation, is 45%. The left  ventricle has mildly decreased function. The left ventricle demonstrates regional wall motion abnormalities with apical akinesis. There is severe concentric left ventricular hypertrophy. Consider cardiac amyloidosis. Left ventricular diastolic  parameters are consistent with Grade II diastolic dysfunction  (pseudonormalization). The average left ventricular global longitudinal strain is -3.8 %. The global longitudinal strain  is abnormal.   2. Right ventricular systolic function is moderately reduced. The right ventricular size is normal. There is mildly elevated pulmonary artery systolic pressure. The estimated right ventricular systolic pressure is 41.9 mmHg.   3. Left atrial size was moderately dilated.   4. The mitral valve is normal in structure. Trivial mitral valve  regurgitation. No evidence of mitral stenosis.   5. Tricuspid  valve regurgitation is mild to moderate.   6. The aortic valve is tricuspid. There is mild calcification of the  aortic valve. Aortic valve regurgitation is not visualized. No aortic  stenosis is present.   7. Aortic dilatation noted.   8. The inferior vena cava is normal in size with greater than 50%  respiratory variability, suggesting right atrial pressure of 3 mmHg.   LEXISCAN  MYOVIEW : 06/21/2021 IMPRESSION: 1. No reversible ischemia.  Moderate apical scarring/infarct.  2.  Generalized hypokinesis, severe at the apex.  3. Left ventricular ejection fraction 41%. Moderately abnormal LV systolic volume.  4. Non invasive risk stratification*: Intermediate   Laboratory Data: High Sensitivity Troponin:   Recent Labs  Lab 02/14/24 0347 02/14/24 0413  TROPONINIHS 75* 75*     Chemistry Recent Labs  Lab 02/14/24 0347 02/14/24 1020  NA 138  --   K 7.3*  --   CL 97*  --   CO2 18*  --   GLUCOSE 107*  --   BUN 87*  --   CREATININE 12.86* 14.07*  CALCIUM  9.5  --   GFRNONAA 3* 3*  ANIONGAP 23*  --      Recent Labs  Lab 02/14/24 0347  PROT 7.8  ALBUMIN  4.0  AST 15  ALT 7  ALKPHOS 62  BILITOT 0.9   Lipids No results for input(s): CHOL, TRIG, HDL, LABVLDL, LDLCALC, CHOLHDL in the last 168 hours.  Hematology Recent Labs  Lab 02/14/24 0347 02/14/24 1020  WBC 7.4 6.4  RBC 4.31 3.88  HGB 12.3 11.1*  HCT 40.6 36.2  MCV 94.2 93.3  MCH 28.5 28.6  MCHC 30.3 30.7  RDW 19.2* 18.9*  PLT 195 224   Thyroid  No results for input(s): TSH, FREET4 in the last 168 hours.  BNP Recent Labs  Lab 02/14/24 0347  BNP >4,500.0*    DDimer No results for input(s): DDIMER in the last 168 hours.  Radiology/Studies:  DG Chest Port 1 View Result Date: 02/14/2024 EXAM: 1 VIEW XRAY OF THE CHEST 02/14/2024 03:11:00 AM COMPARISON: 10/28/2023 CLINICAL HISTORY: Shortness of breath. FINDINGS: LUNGS AND PLEURA: Lungs are well expanded. Mild interstitial pulmonary edema is present. Superimposed airspace infiltrate is seen at the right lung base which may represent asymmetric pulmonary edema or superimposed infectious process. Trace left pleural effusion. No pneumothorax. HEART AND MEDIASTINUM: Stable moderate cardiomegaly. Median sternotomy has been performed. BONES AND SOFT TISSUES: No acute bone abnormality. LINES, TUBES AND DEVICES: Right internal jugular hemodialysis catheter tip seen within the right atrium, unchanged. IMPRESSION: 1. Mild interstitial pulmonary edema and superimposed right basilar airspace infiltrate, possibly representing asymmetric pulmonary edema or superimposed infectious process. 2. Trace left pleural effusion. 3. Stable moderate cardiomegaly. Electronically signed by: Dorethia Molt MD 02/14/2024 03:18 AM EDT RP Workstation: [id number]     Assessment and Plan: Chest pain - Troponin elevation is flat and consistent with previous values - Pain is not exertional, is pleuritic in nature, worse with deep inspiration and palpation - CP has resolved on HD 02/14/24 - Last  echo was January of this year, will repeat   2.  Abnormal ECG - RBBB is old. - QRS duration is 155 milliseconds with a potassium of 7.3 - Her QRS baseline appears to be 128 ms, which it was on 6/13 when she saw Dr. Michele - Recheck ECG after HD - Upon carefully reviewing the ECGs, they all appear to be sinus rhythm with frequent PACs at times, although there is one time when she appears to be  in coarse Afib (1355, 1357) - continue reduced dose Eliquis   3.  ESRD on HD - Patient is compliant with dialysis appointments, last had dialysis on Saturday and says they reached her dry weight - She says she is compliant with all of her medications and did not over-drink liquids or over-eat high sodium foods between her last dialysis and now - Management per IM/Nephrology   Risk Assessment/Risk Scores:      [place]  Heart Association (NYHA) Functional Class NYHA Class IV   For questions or updates, please contact [facility] Please consult [email or web address] for contact info under   I spent 42 minutes seeing this patient. During that time I reviewed their history, evaluated their symptoms, reviewed available labs, EKGs, studies, performed an exam and formulated the assessment and plan above.    Signed, Shona Shad, PA-C  02/14/2024 12:11 PM   I have personally evaluated and examined the patient. The history, physical exam, and medical decision making documented below were performed independently and substantively by me. I have reviewed all relevant data, formulated the assessment and plan, and assumed responsibility for the management of this patient. My documentation reflects the substantive portion of the split/shared visit, in accordance with CMS and CPT guidelines. I have updated the NPP documentation above as appropriate.  I have personally performed the substantive portion of the medical decision making, including interpretation of diagnostic data, formulation of the management  plan, and assessment of risks. In summation:  Monique Henderson with a history of aortic dissection and paroxysmal atrial fibrillation, presents with chest pain and palpitations.  She has been experiencing chest pain since Saturday morning, which has improved after receiving dialysis. She is on a Tuesday, Thursday, Saturday dialysis schedule. The pain has been associated with fluid overload in the past, and the removal of excess fluid during dialysis has helped alleviate her symptoms. No fever, chills, or symptoms suggestive of an active infection.  She has a history of aortic dissection and underwent significant surgery fin 04/2019.  She has a history of paroxysmal atrial fibrillation and is currently on eliquis . She is currently taking carvedilol  and has been adherent to her medication regimen, feeling better after her blood pressure was managed (SBP on presentation was over 210 mm HG) and some fluid was removed during dialysis.  She has a history of pneumonia from March 2025 but currently denies any symptoms consistent with pneumonia.  Exam notable for  Gen: bi distress, thin and frail   Neck: No JVD Cardiac: No Rubs or Gallops, S3 noted RRR seen on HD, access site not palpated Respiratory: Clear to auscultation bilaterally, normal effort, normal  respiratory rate GI: Soft, nontender, non-distended  MS: No  edema;  moves all extremities Integument: Skin feels warm Neuro:  At time of evaluation, alert and oriented to person/place/time/situation  Psych: Normal affect, patient feels ok  Personally reviewed data and interpretation:   LABS Troponin: no elevation this admission  RADIOLOGY Chest CT: Dissection with chronic changes around the adventitia and evidence of PNA in the lung fields (08/21/2023)  DIAGNOSTIC EKG: Right bundle branch block, stable (02/14/2024) with bi-atrial dilation Tele on exam: Sinus rhythm with P wave oversensing - listed as NSVT or VT inappropriately Tele:1355 and  1357: Paroxysmal atrial fibrillation  In assessment and plan:   Chest pain Chest pain began on Saturday morning. Blood work and troponin assays are within normal limits, not consistent with dissection. Prior CT imaging reviewed, showing chronic changes. Pain resolved with hemodialysis, suggesting volume overload  as a potential cause. No fever or chills reported, and EKG shows right bundle branch block. Differential includes volume overload and hypertension. - Order echocardiogram to evaluate cardiac structure and function(I don't suspect pericarditis or AI) - with Imdur , norvasc , hydralazine  and HD her BP is normal and she feels well  ESRD Volume overload likely contributed to chest pain. Improved with dialysis and blood pressure management. Echocardiogram warranted to assess cardiac function. - On hemodialysis with a Tuesday, Thursday, Saturday schedule. Volume overload likely contributed to chest pain, resolved with dialysis. Monitoring of fluid status is crucial.  Hypertension Hypertension managed with carvedilol . Blood pressure improved with current management. Monitoring is essential to prevent volume overload and associated symptoms. - no changes to current therapy  Paroxysmal atrial fibrillation Paroxysmal atrial fibrillation with current sinus rhythm and occasional irregular beats. No indication for antiarrhythmic drugs at this time due to paroxysmal nature and renal disease.  - continue low dose BB and AC  Chronic obstructive pulmonary disease (COPD) COPD is a chronic condition. No acute exacerbation noted during this encounter. - as per primary  If echo is normal we will arrange f/u with cardiology.  Nephrology has arranged f/u as per protocol.  PCP f/u will be critical to prevent re-admission  Stanly Leavens, MD FASE [facility]  [address] [place], [address] [place], [place] [address] [phone]  2:35 PM

## 2024-02-14 NOTE — Consult Note (Signed)
 Renal Service Consult Note [facility] Lamar JONETTA Fret, MD  Patient: Monique Henderson Date: 02/14/2024 Requesting Physician: Dr. MICAEL Ada  Reason for Consult: ESRD patient with shortness of breath and hyperkalemia HPI: The patient is a 73 y.o. year-old w/ PMH as below who presented to ED early this morning complaining of chest pain, shortness of breath and hip pain.  O2 sats were in the 80s on room air, patient received albuterol  and 4 L nasal cannula O2.  TTS dialysis patient, last dialysis on Saturday, has not missed.  History of HTN on multiple medications.  In the ED blood pressure 215/117, heart rate 87-1 23, respiratory rate 32 on arrival now 16-20.  Labs showed potassium 7.3, BUN 87, creatinine 12.8, albumin  4.0, BNP > 4500.  Chest x-ray shows bilateral interstitial edema with possible right-sided opacities as well.  Patient received 10 mg of albuterol , p.o. Norvasc  and Coreg , IV insulin  and dextrose , IV hydralazine  x 2, oral hydralazine , Dilaudid  IV, Imdur , Lokelma , IV calcium  gluconate, IV Rocephin .  Patient was admitted.  We are asked to see for dialysis.   Pt seen in ED.  Patient says her main complaint was chest pain, left hip pain and shortness of breath.  She has not missed any dialysis.  Her last dialysis was Saturday.  She is not on home oxygen .  She lives with her son.  She takes the [employer] bus to dialysis.  She walks at home not using a walker.   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 Deward Null; S/P re-excision of superior and inferior margins 10/27/2012.    Dissection of aorta (HCC) 04/03/2019   ESRD on hemodialysis (HCC) 05/29/2019   Essential hypertension 09/16/2006   GERD 09/16/2006   Hepatitis C    treated and RNA confirmed not detectable 01/2017   Insomnia 03/14/2015   Malnutrition of moderate degree 05/19/2019   Non compliance with medical treatment 12/04/2017   Normocytic anemia    With thrombocytosis   Osteoarthritis    Right ureteral stone 2002   S/P lumbar spinal fusion 01/18/2014   S/P lumbar decompressive laminectomy, fusion, and plating for lumbar spinal stenosis on 05/27/2009 by Dr. Alm CANDIE Molt.  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 Alm CANDIE Molt.     Stroke (cerebrum) [facility])    Stroke (HCC) 02/23/2022   SVT (supraventricular tachycardia) (HCC) 08/28/2019   Tobacco use disorder 04/19/2009  Uterine fibroid    Wears dentures    top   Past Surgical History  Past Surgical History:  Procedure Laterality Date   ANTERIOR LAT LUMBAR FUSION N/A 01/18/2014   Procedure: ANTERIOR LATERAL LUMBAR FUSION LUMBAR TWO-THREE;  Surgeon: Alm GORMAN Molt, MD;  Location: [facility];  Service: Neurosurgery;  Laterality: N/A;   ANTERIOR LUMBAR FUSION  01/18/2014   AV FISTULA PLACEMENT Left 04/20/2019   Procedure: ARTERIOVENOUS (AV) FISTULA CREATION;   Surgeon: Sheree Penne Bruckner, MD;  Location: [facility] OR;  Service: Vascular;  Laterality: Left;   BACK SURGERY     BREAST LUMPECTOMY Left 01/2003   BREAST LUMPECTOMY Right 2014   BREAST LUMPECTOMY WITH NEEDLE LOCALIZATION AND AXILLARY SENTINEL LYMPH NODE BX Right 10/13/2012   Procedure: BREAST LUMPECTOMY WITH NEEDLE LOCALIZATION;  Surgeon: Deward GORMAN Curvin DOUGLAS, MD;  Location: [facility];  Service: General;  Laterality: Right;  Right breast wire localized lumpectomy   DIALYSIS/PERMA CATHETER INSERTION N/A 05/28/2023   Procedure: DIALYSIS/PERMA CATHETER INSERTION;  Surgeon: Melia Lynwood ORN, MD;  Location: [facility];  Service: Cardiovascular;  Laterality: N/A;   INSERTION OF DIALYSIS CATHETER Right 04/20/2019   Procedure: INSERTION OF DIALYSIS CATHETER, right internal jugular;  Surgeon: Sheree Penne Bruckner, MD;  Location: [facility] OR;  Service: Vascular;  Laterality: Right;   INSERTION OF DIALYSIS CATHETER Right 09/24/2020   Procedure: INSERTION OF TUNNELED DIALYSIS CATHETER;  Surgeon: Oris Krystal FALCON, MD;  Location: [facility] OR;  Service: Vascular;  Laterality: Right;   IR FLUORO GUIDE CV LINE RIGHT  09/22/2020   IR THORACENTESIS ASP PLEURAL SPACE W/IMG GUIDE  05/19/2019   IR US  GUIDE VASC ACCESS LEFT  09/22/2020   IR US  GUIDE VASC ACCESS RIGHT  09/22/2020   IR VENOCAVAGRAM SVC  09/22/2020   LAMINECTOMY  05/27/2009   Lumbar decompressive laminectomy, fusion and plating for lumbar spinal stensosis   LIGATION OF ARTERIOVENOUS  FISTULA Left 09/24/2020   Procedure: LIGATION OF LEFT ARM ARTERIOVENOUS  FISTULA;  Surgeon: Oris Krystal FALCON, MD;  Location: [facility] OR;  Service: Vascular;  Laterality: Left;   LUMBAR LAMINECTOMY/DECOMPRESSION MICRODISCECTOMY Left 03/23/2013   Procedure: LUMBAR LAMINECTOMY/DECOMPRESSION MICRODISCECTOMY 1 LEVEL;  Surgeon: Alm GORMAN Molt, MD;  Location: [facility];  Service: Neurosurgery;  Laterality: Left;  LUMBAR LAMINECTOMY/DECOMPRESSION MICRODISCECTOMY 1 LEVEL   MASTECTOMY,  PARTIAL Left 02/26/2003   ; S/P re-excision of cranial and lateral margins 04/19/2003.    RE-EXCISION OF BREAST CANCER,SUPERIOR MARGINS Right 10/27/2012   Procedure: RE-EXCISION OF BREAST CANCER,SUPERIOR and inferior MARGINS;  Surgeon: Deward GORMAN Curvin DOUGLAS, MD;  Location: [facility] OR;  Service: General;  Laterality: Right;   RE-EXCISION OF BREAST LUMPECTOMY Left 04/2003   TEE WITHOUT CARDIOVERSION N/A 04/04/2019   Procedure: Transesophageal Echocardiogram (Tee);  Surgeon: German Bartlett PEDLAR, MD;  Location: [facility] OR;  Service: Open Heart Surgery;  Laterality: N/A;   THORACIC AORTIC ANEURYSM REPAIR N/A 04/04/2019   Procedure: THORACIC ASCENDING ANEURYSM REPAIR (AAA)  USING 28 MM X 30 CM HEMASHIELD PLATINUM VASCULAR GRAFT;  Surgeon: German Bartlett PEDLAR, MD;  Location: [facility] OR;  Service: Open Heart Surgery;  Laterality: N/A;   Family History  Family History  Problem Relation Age of Onset   Colon cancer Mother 38   Hypertension Mother    Diabetes Sister 53   Hypertension Sister    Diabetes Brother    Hypertension Brother    Diabetes Brother    Hypertension Brother    Kidney disease Son  s/p renal transplant   Hypertension Son    Diabetes Son    Multiple sclerosis Son    Bone cancer Sister 42   Breast cancer Neg Hx    Cervical cancer Neg Hx    Social History  reports that she quit smoking about 3 years ago. Her smoking use included cigarettes. She started smoking about 47 years ago. She has a 11 pack-year smoking history. She has never used smokeless tobacco. She reports that she does not currently use alcohol  after a past usage of about 2.0 standard drinks of alcohol  per week. She reports that she does not currently use drugs after having used the following drugs: Cocaine . Allergies  Allergies  Allergen Reactions   Shrimp [Shellfish Allergy] Shortness Of Breath   Bactroban  [Mupirocin ] Other (See Comments)    Sores in nose   Hydrocodone  Itching, Nausea And Vomiting and Nausea Only   Chlorhexidine   Gluconate Itching   Latex Itching    Latex gloves cause itchiness and a rash   Tylenol  [Acetaminophen ] Itching    Takes Percocet at home 05/06/23   Zestril  [Lisinopril ] Cough   Home medications Prior to Admission medications   Medication Sig Start Date End Date Taking? Authorizing Provider  acetaminophen  (TYLENOL ) 325 MG tablet Take 650 mg by mouth every 6 (six) hours as needed for mild pain (pain score 1-3) or moderate pain (pain score 4-6).    [provider]  albuterol  (PROVENTIL ) (2.5 MG/3ML) 0.083% nebulizer solution Take 3 mLs (2.5 mg total) by nebulization every 6 (six) hours as needed for wheezing or shortness of breath. 08/23/23   Pokhrel, Laxman, MD  albuterol  (VENTOLIN  HFA) 108 (90 Base) MCG/ACT inhaler Inhale 2 puffs into the lungs every 6 (six) hours as needed. 10/02/23   [provider]  amLODipine  (NORVASC ) 10 MG tablet Take 1 tablet (10 mg total) by mouth daily. 03/06/22   Setzer, Sandra J, PA-C  apixaban  (ELIQUIS ) 2.5 MG TABS tablet Take 1 tablet (2.5 mg total) by mouth 2 (two) times daily. 03/06/22   Setzer, Sandra J, PA-C  carvedilol  (COREG ) 25 MG tablet Take 1 tablet (25 mg total) by mouth 2 (two) times daily with a meal. 05/01/22 06/16/24  Ghimire, Donalda HERO, MD  Cholecalciferol  (VITAMIN D -3) 125 MCG (5000 UT) TABS Take 1 tablet by mouth in the morning.    [provider]  cinacalcet  (SENSIPAR ) 60 MG tablet Take 60 mg by mouth every dialysis (Tuesday, Thursday, and Saturday).    [provider]  cloNIDine  (CATAPRES ) 0.1 MG tablet Take 1 tablet (0.1 mg total) by mouth 2 (two) times daily. Do not take it before dialysis 09/02/23   Fairy Frames, MD  hydrALAZINE  (APRESOLINE ) 100 MG tablet Take 1 tablet (100 mg total) by mouth every 8 (eight) hours. 12/18/22   Dann Candyce RAMAN, MD  isosorbide  mononitrate (IMDUR ) 30 MG 24 hr tablet TAKE 1 TABLET (30MG ) ON DIALYSIS DAYS AND 2 TABLETS (60 MG) ON NON DIALYSIS DAYS. 12/22/23   Tolia, Sunit, DO  LOKELMA   10 g PACK packet Take 10 g by mouth every Monday, Wednesday, and Friday. 07/30/23   [provider]  naloxone  (NARCAN ) nasal spray 4 mg/0.1 mL Place 1 spray into the nose as needed (opioid overdose). 02/19/22   [provider]  oxyCODONE -acetaminophen  (PERCOCET) 10-325 MG tablet Take 1 tablet by mouth 4 (four) times daily as needed for pain.    [provider]  pantoprazole  (PROTONIX ) 40 MG tablet TAKE 1 TABLET (40 MG TOTAL) BY MOUTH  2 (TWO) TIMES DAILY. 05/13/23   Tanda Bleacher, MD  rosuvastatin  (CRESTOR ) 10 MG tablet TAKE 1 TABLET (10 MG TOTAL) BY MOUTH DAILY FOR CHOLESTEROL 08/07/22   Tanda Bleacher, MD  senna-docusate (SENOKOT-S) 8.6-50 MG tablet Take 1 tablet by mouth 2 (two) times daily between meals as needed for mild constipation. 08/14/22   Gonfa, Taye T, MD  traZODone  (DESYREL ) 50 MG tablet Take 25-50 mg by mouth at bedtime as needed for sleep. 03/19/23   [provider]     Vitals:   02/14/24 0915 02/14/24 0930 02/14/24 0945 02/14/24 1000  BP: 116/87 112/80 125/81 (!) 143/98  Pulse: (!) 116 (!) 112 (!) 120 (!) 107  Resp: 16 20 (!) 25 15  Temp:      TempSrc:      SpO2: 98% 99% 98% 97%   Exam Gen elderly, frail, has cheynes-stokes type periods of heavy breathing  Sclera anicteric, throat clear  No jvd or bruits Chest clear bilat to bases RRR no MRG Abd soft ntnd no mass or ascites +bs Ext no LE or UE edema, no other edema Neuro is alert , nf    RIJ TDC intact  Home bp meds: Norvasc  10 Coreg  25 bid Clonidine  0.1 bid Hydralazine  100 tid    OP HD: [facility] 3.5h  B400   43.5kg   2K bath  TDC Heparin  none Last op hd 9/11, post wt 47.9kg (+ 4 kg) Coming off +4kg last 10 days Good compliance w/ HD , stays on   Assessment/ Plan: SOB/ hypoxemia: w/ bilat pulm edema on CXR, no gross overload on exam. Is not in distress on 5 L [place] O2. Has periods of heavy breathing that come and go which may be cheynes-stokes apnea. Plan HD upstairs next open  spot.  ESRD: on HD TTS. No missed HD. HD as above.  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, no esa issues Afib: on coumadin  Chronic HFrEF COPD       Myer Fret  MD [facility] 02/14/2024, 10:10 AM  Recent Labs  Lab 02/14/24 0347  HGB 12.3  ALBUMIN  4.0  CALCIUM  9.5  CREATININE 12.86*  K 7.3*   Inpatient medications:  [START ON 02/15/2024] amLODipine   10 mg Oral Daily   apixaban   2.5 mg Oral BID   carvedilol   25 mg Oral BID WC   [START ON 02/15/2024] cinacalcet   60 mg Oral Q T,Th,Sat-1800   hydrALAZINE   100 mg Oral Q8H   [START ON 02/15/2024] isosorbide  mononitrate  30 mg Oral Once per day on Tuesday Thursday Saturday   [START ON 02/16/2024] isosorbide  mononitrate  60 mg Oral Once per day on Sunday Monday Wednesday Friday   metoprolol  tartrate  5 mg Intravenous Once   rosuvastatin   10 mg Oral Daily    oxyCODONE , senna-docusate

## 2024-02-14 NOTE — ED Provider Notes (Signed)
 [facility] AT [facility] Provider Note   CSN: [id number] Arrival date & time: 02/14/24  9741     Patient presents with: Shortness of Breath   Monique Henderson is a 73 y.o. female.   Patient presents to the emergency department for evaluation of shortness of breath.  She has end-stage renal disease, on dialysis Tuesday, Thursday, Saturday.  She has not missed any dialysis sessions.  EMS reports that she was exhibiting difficulty breathing upon their arrival and her room air oxygen  saturations were in the 80s.  She was given albuterol  because she did have some slight wheezing.  Attempts to place her back on nasal cannula were unsuccessful, oxygen  dropped, placed on nonrebreather and transported to the emergency department.       Prior to Admission medications   Medication Sig Start Date End Date Taking? Authorizing Provider  acetaminophen  (TYLENOL ) 325 MG tablet Take 650 mg by mouth every 6 (six) hours as needed for mild pain (pain score 1-3) or moderate pain (pain score 4-6).    [provider]  albuterol  (PROVENTIL ) (2.5 MG/3ML) 0.083% nebulizer solution Take 3 mLs (2.5 mg total) by nebulization every 6 (six) hours as needed for wheezing or shortness of breath. 08/23/23   Pokhrel, Vernal, MD  albuterol  (VENTOLIN  HFA) 108 (90 Base) MCG/ACT inhaler Inhale 2 puffs into the lungs every 6 (six) hours as needed. 10/02/23   [provider]  amLODipine  (NORVASC ) 10 MG tablet Take 1 tablet (10 mg total) by mouth daily. 03/06/22   Setzer, Sandra J, PA-C  apixaban  (ELIQUIS ) 2.5 MG TABS tablet Take 1 tablet (2.5 mg total) by mouth 2 (two) times daily. 03/06/22   Setzer, Sandra J, PA-C  carvedilol  (COREG ) 25 MG tablet Take 1 tablet (25 mg total) by mouth 2 (two) times daily with a meal. 05/01/22 06/16/24  Ghimire, Donalda HERO, MD  Cholecalciferol  (VITAMIN D -3) 125 MCG (5000 UT) TABS Take 1 tablet by mouth in the morning.    [provider]   cinacalcet  (SENSIPAR ) 60 MG tablet Take 60 mg by mouth every dialysis (Tuesday, Thursday, and Saturday).    [provider]  cloNIDine  (CATAPRES ) 0.1 MG tablet Take 1 tablet (0.1 mg total) by mouth 2 (two) times daily. Do not take it before dialysis 09/02/23   Fairy Frames, MD  hydrALAZINE  (APRESOLINE ) 100 MG tablet Take 1 tablet (100 mg total) by mouth every 8 (eight) hours. 12/18/22   Dann Candyce RAMAN, MD  isosorbide  mononitrate (IMDUR ) 30 MG 24 hr tablet TAKE 1 TABLET (30MG ) ON DIALYSIS DAYS AND 2 TABLETS (60 MG) ON NON DIALYSIS DAYS. 12/22/23   Tolia, Sunit, DO  LOKELMA  10 g PACK packet Take 10 g by mouth every Monday, Wednesday, and Friday. 07/30/23   [provider]  naloxone  (NARCAN ) nasal spray 4 mg/0.1 mL Place 1 spray into the nose as needed (opioid overdose). 02/19/22   [provider]  oxyCODONE -acetaminophen  (PERCOCET) 10-325 MG tablet Take 1 tablet by mouth 4 (four) times daily as needed for pain.    [provider]  pantoprazole  (PROTONIX ) 40 MG tablet TAKE 1 TABLET (40 MG TOTAL) BY MOUTH 2 (TWO) TIMES DAILY. 05/13/23   Tanda Bleacher, MD  rosuvastatin  (CRESTOR ) 10 MG tablet TAKE 1 TABLET (10 MG TOTAL) BY MOUTH DAILY FOR CHOLESTEROL 08/07/22   Tanda Bleacher, MD  senna-docusate (SENOKOT-S) 8.6-50 MG tablet Take 1 tablet by mouth 2 (two) times daily between meals as needed for mild constipation. 08/14/22   Gonfa,  Mignon DASEN, MD  traZODone  (DESYREL ) 50 MG tablet Take 25-50 mg by mouth at bedtime as needed for sleep. 03/19/23   [provider]    Allergies: Shrimp [shellfish allergy], Bactroban  [mupirocin ], Hydrocodone , Chlorhexidine  gluconate, Latex, Tylenol  [acetaminophen ], and Zestril  [lisinopril ]    Review of Systems  Updated Vital Signs BP (!) 196/103   Pulse 89   Temp (!) 97.3 F (36.3 C) (Oral)   Resp (!) 31   SpO2 99%   Physical Exam Vitals and nursing note reviewed.  Constitutional:      General: She is not in acute distress.     Appearance: She is well-developed.  HENT:     Head: Normocephalic and atraumatic.     Mouth/Throat:     Mouth: Mucous membranes are moist.  Eyes:     General: Vision grossly intact. Gaze aligned appropriately.     Extraocular Movements: Extraocular movements intact.     Conjunctiva/sclera: Conjunctivae normal.  Cardiovascular:     Rate and Rhythm: Normal rate and regular rhythm.     Pulses: Normal pulses.     Heart sounds: Normal heart sounds, S1 normal and S2 normal. No murmur heard.    No friction rub. No gallop.  Pulmonary:     Effort: Pulmonary effort is normal. No respiratory distress.     Breath sounds: Normal breath sounds.  Abdominal:     General: Bowel sounds are normal.     Palpations: Abdomen is soft.     Tenderness: There is no abdominal tenderness. There is no guarding or rebound.     Hernia: No hernia is present.  Musculoskeletal:        General: No swelling.     Cervical back: Full passive range of motion without pain, normal range of motion and neck supple. No spinous process tenderness or muscular tenderness. Normal range of motion.     Right lower leg: No edema.     Left lower leg: No edema.  Skin:    General: Skin is warm and dry.     Capillary Refill: Capillary refill takes less than 2 seconds.     Findings: No ecchymosis, erythema, rash or wound.  Neurological:     General: No focal deficit present.     Mental Status: She is alert and oriented to person, place, and time.     GCS: GCS eye subscore is 4. GCS verbal subscore is 5. GCS motor subscore is 6.     Cranial Nerves: Cranial nerves 2-12 are intact.     Sensory: Sensation is intact.     Motor: Motor function is intact.     Coordination: Coordination is intact.  Psychiatric:        Attention and Perception: Attention normal.        Mood and Affect: Mood is anxious.        Speech: Speech normal.        Behavior: Behavior normal.     (all labs ordered are listed, but only abnormal results are  displayed) Labs Reviewed  CBC WITH DIFFERENTIAL/PLATELET - Abnormal; Notable for the following components:      Result Value   RDW 19.2 (*)    Lymphs Abs 0.4 (*)    All other components within normal limits  COMPREHENSIVE METABOLIC PANEL WITH GFR - Abnormal; Notable for the following components:   Potassium 7.3 (*)    Chloride 97 (*)    CO2 18 (*)    Glucose, Bld 107 (*)    BUN  87 (*)    Creatinine, Ser 12.86 (*)    GFR, Estimated 3 (*)    Anion gap 23 (*)    All other components within normal limits  BRAIN NATRIURETIC PEPTIDE - Abnormal; Notable for the following components:   B Natriuretic Peptide >4,500.0 (*)    All other components within normal limits  TROPONIN I (HIGH SENSITIVITY) - Abnormal; Notable for the following components:   Troponin I (High Sensitivity) 75 (*)    All other components within normal limits  TROPONIN I (HIGH SENSITIVITY) - Abnormal; Notable for the following components:   Troponin I (High Sensitivity) 75 (*)    All other components within normal limits  CULTURE, BLOOD (ROUTINE X 2)  CULTURE, BLOOD (ROUTINE X 2)  I-STAT CG4 LACTIC ACID, ED    EKG: EKG Interpretation Date/Time:  Monday February 14 2024 03:01:38 EDT Ventricular Rate:  89 PR Interval:  190 QRS Duration:  155 QT Interval:  443 QTC Calculation: 540 R Axis:   68  Text Interpretation: Sinus rhythm Left atrial enlargement Right bundle branch block No significant change since last tracing Confirmed by Haze Lonni PARAS ([id number]) on 02/14/2024 3:56:13 AM  Radiology: [facility] Chest Port 1 View Result Date: 02/14/2024 EXAM: 1 VIEW XRAY OF THE CHEST 02/14/2024 03:11:00 AM COMPARISON: 10/28/2023 CLINICAL HISTORY: Shortness of breath. FINDINGS: LUNGS AND PLEURA: Lungs are well expanded. Mild interstitial pulmonary edema is present. Superimposed airspace infiltrate is seen at the right lung base which may represent asymmetric pulmonary edema or superimposed infectious process. Trace left pleural  effusion. No pneumothorax. HEART AND MEDIASTINUM: Stable moderate cardiomegaly. Median sternotomy has been performed. BONES AND SOFT TISSUES: No acute bone abnormality. LINES, TUBES AND DEVICES: Right internal jugular hemodialysis catheter tip seen within the right atrium, unchanged. IMPRESSION: 1. Mild interstitial pulmonary edema and superimposed right basilar airspace infiltrate, possibly representing asymmetric pulmonary edema or superimposed infectious process. 2. Trace left pleural effusion. 3. Stable moderate cardiomegaly. Electronically signed by: Dorethia Molt MD 02/14/2024 03:18 AM EDT RP Workstation: [id number]     Procedures   Medications Ordered in the ED  insulin  aspart (novoLOG ) injection 10 Units (has no administration in time range)    And  dextrose  50 % solution 50 mL (has no administration in time range)  sodium zirconium cyclosilicate  (LOKELMA ) packet 10 g (has no administration in time range)  albuterol  (PROVENTIL ) (2.5 MG/3ML) 0.083% nebulizer solution 10 mg (has no administration in time range)  calcium  gluconate 1 g/ 50 mL sodium chloride  IVPB (has no administration in time range)  hydrALAZINE  (APRESOLINE ) injection 20 mg (20 mg Intravenous Given 02/14/24 0424)  cefTRIAXone  (ROCEPHIN ) 1 g in sodium chloride  0.9 % 100 mL IVPB (0 g Intravenous Stopped 02/14/24 0517)  doxycycline  (VIBRA -TABS) tablet 100 mg (100 mg Oral Given 02/14/24 0447)  HYDROmorphone  (DILAUDID ) injection 1 mg (1 mg Intravenous Given 02/14/24 0523)  hydrALAZINE  (APRESOLINE ) injection 10 mg (10 mg Intravenous Given 02/14/24 9388)                                    Medical Decision Making Amount and/or Complexity of Data Reviewed External Data Reviewed: labs, radiology, ECG and notes. Labs: ordered. Decision-making details documented in ED Course. Radiology: ordered and independent interpretation performed. Decision-making details documented in ED Course. ECG/medicine tests: ordered and independent  interpretation performed. Decision-making details documented in ED Course.  Risk Prescription drug management. Decision regarding hospitalization.   Differential Diagnosis  considered includes, but not limited to: CHF exacerbation; ACS/MI; Bronchitis/URI; Pneumonia; PE  Presents to the emergency department for evaluation of shortness of breath.  Patient is a dialysis patient, has been dialyzing regularly.  She is transported by EMS.  Patient markedly hypertensive during transport.  At arrival she is still short of breath.  Patient hypoxic on room air, does not normally use oxygen .  Chest x-ray at arrival shows signs of edema but also possible pneumonia.  Initiated on Rocephin  and doxycycline  (prolonged QT).  Administered IV hydralazine  for blood pressure, improving.  Patient weaned to nasal cannula oxygen .  Lab work reveals potassium greater than 7.  No EKG changes.  Appropriate treatment administered for hyperkalemia.  Discussed briefly with Dr. Rayburn, nephrology, patient will be dialyzed this morning.  Admit to hospitalist service.  CRITICAL CARE Performed by: Lonni JINNY Seats   Total critical care time: 35 minutes  Critical care time was exclusive of separately billable procedures and treating other patients.  Critical care was necessary to treat or prevent imminent or life-threatening deterioration.  Critical care was time spent personally by me on the following activities: development of treatment plan with patient and/or surrogate as well as nursing, discussions with consultants, evaluation of patient's response to treatment, examination of patient, obtaining history from patient or surrogate, ordering and performing treatments and interventions, ordering and review of laboratory studies, ordering and review of radiographic studies, pulse oximetry and re-evaluation of patient's condition.        Final diagnoses:  Hyperkalemia  Acute pulmonary edema [facility])     ED Discharge Orders     None          Seats Lonni JINNY, MD 02/14/24 [phone]

## 2024-02-14 NOTE — Progress Notes (Signed)
New Admission Note:   Arrival Method:  Mental Orientation: Telemetry:  Assessment: Completed Skin: IV: Pain:  Tubes: Safety Measures: Safety Fall Prevention Plan has been discussed.  Admission: Completed 5MW Orientation: Patient has been oriented to the room, unit and staff.  Family:  Orders have been reviewed and implemented. Will continue to monitor the patient. Call light has been placed within reach and bed alarm has been activated.   Charito Bokiagon BSN, RN-BC Phone number: [phone] 

## 2024-02-14 NOTE — Progress Notes (Signed)
 Pt receives out-pt HD at [facility] on TTS 1155 chair time. Will assist as needed.   Lavanda Nia Nathaniel Dialysis Navigator [phone]

## 2024-02-14 NOTE — Progress Notes (Signed)
 Respiratory came in the room and gave 10 mg of albuterol . The nurse had just charted Albuterol  given and was about to give the Albuterol  to the pt. and Respiratory gave instead.

## 2024-02-15 ENCOUNTER — Inpatient Hospital Stay (HOSPITAL_COMMUNITY)

## 2024-02-15 ENCOUNTER — Encounter (HOSPITAL_COMMUNITY): Payer: Self-pay | Admitting: Hospitalist

## 2024-02-15 DIAGNOSIS — I480 Paroxysmal atrial fibrillation: Secondary | ICD-10-CM | POA: Diagnosis not present

## 2024-02-15 DIAGNOSIS — R079 Chest pain, unspecified: Secondary | ICD-10-CM | POA: Diagnosis not present

## 2024-02-15 DIAGNOSIS — N186 End stage renal disease: Secondary | ICD-10-CM | POA: Diagnosis not present

## 2024-02-15 DIAGNOSIS — Z992 Dependence on renal dialysis: Secondary | ICD-10-CM | POA: Diagnosis not present

## 2024-02-15 LAB — CBC
HCT: 39.5 % (ref 36.0–46.0)
Hemoglobin: 12.5 g/dL (ref 12.0–15.0)
MCH: 29.3 pg (ref 26.0–34.0)
MCHC: 31.6 g/dL (ref 30.0–36.0)
MCV: 92.5 fL (ref 80.0–100.0)
Platelets: 204 K/uL (ref 150–400)
RBC: 4.27 MIL/uL (ref 3.87–5.11)
RDW: 18.7 % — ABNORMAL HIGH (ref 11.5–15.5)
WBC: 4.7 K/uL (ref 4.0–10.5)
nRBC: 0 % (ref 0.0–0.2)

## 2024-02-15 LAB — BASIC METABOLIC PANEL WITH GFR
Anion gap: 15 (ref 5–15)
BUN: 51 mg/dL — ABNORMAL HIGH (ref 8–23)
CO2: 23 mmol/L (ref 22–32)
Calcium: 9.1 mg/dL (ref 8.9–10.3)
Chloride: 96 mmol/L — ABNORMAL LOW (ref 98–111)
Creatinine, Ser: 8.65 mg/dL — ABNORMAL HIGH (ref 0.44–1.00)
GFR, Estimated: 5 mL/min — ABNORMAL LOW (ref >60)
Glucose, Bld: 78 mg/dL (ref 70–99)
Potassium: 4.9 mmol/L (ref 3.5–5.1)
Sodium: 134 mmol/L — ABNORMAL LOW (ref 135–145)

## 2024-02-15 LAB — ECHOCARDIOGRAM COMPLETE
Area-P 1/2: 4.96 cm2
Calc EF: 45.3 %
S' Lateral: 3.5 cm
Single Plane A2C EF: 42.7 %
Single Plane A4C EF: 44.6 %

## 2024-02-15 LAB — HEPATITIS B SURFACE ANTIBODY, QUANTITATIVE: Hep B S AB Quant (Post): 69.1 m[IU]/mL

## 2024-02-15 LAB — MRSA NEXT GEN BY PCR, NASAL: MRSA by PCR Next Gen: DETECTED — AB

## 2024-02-15 MED ORDER — ALTEPLASE 2 MG IJ SOLR: 2.0000 mg | Freq: Once | INTRAMUSCULAR | Status: DC | PRN

## 2024-02-15 MED ORDER — SODIUM CHLORIDE 0.9% FLUSH
10.0000 mL | Freq: Two times a day (BID) | INTRAVENOUS | Status: DC
  Administered 2024-02-15 (×2): 10 mL

## 2024-02-15 MED ORDER — HEPARIN SODIUM (PORCINE) 1000 UNIT/ML IJ SOLN
INTRAMUSCULAR | Status: AC
  Filled 2024-02-15: qty 4

## 2024-02-15 MED ORDER — LIDOCAINE HCL (PF) 1 % IJ SOLN: 5.0000 mL | INTRAMUSCULAR | Status: DC | PRN

## 2024-02-15 MED ORDER — HEPARIN SODIUM (PORCINE) 1000 UNIT/ML DIALYSIS
1000.0000 [IU] | INTRAMUSCULAR | Status: DC | PRN
  Administered 2024-02-16: 1000 [IU]
  Filled 2024-02-15: qty 1

## 2024-02-15 MED ORDER — NEPRO/CARBSTEADY PO LIQD: 237.0000 mL | ORAL | Status: DC | PRN

## 2024-02-15 MED ORDER — PENTAFLUOROPROP-TETRAFLUOROETH EX AERO: 1.0000 | INHALATION_SPRAY | CUTANEOUS | Status: DC | PRN

## 2024-02-15 MED ORDER — LIDOCAINE-PRILOCAINE 2.5-2.5 % EX CREA: 1.0000 | TOPICAL_CREAM | CUTANEOUS | Status: DC | PRN

## 2024-02-15 MED ORDER — SODIUM CHLORIDE 0.9% FLUSH: 10.0000 mL | INTRAVENOUS | Status: DC | PRN

## 2024-02-15 MED ORDER — ANTICOAGULANT SODIUM CITRATE 4% (200MG/5ML) IV SOLN: 5.0000 mL | Status: DC | PRN

## 2024-02-15 NOTE — Plan of Care (Signed)
   Problem: Education: Goal: Knowledge of General Education information will improve Description: Including pain rating scale, medication(s)/side effects and non-pharmacologic comfort measures Outcome: Progressing   Problem: Clinical Measurements: Goal: Respiratory complications will improve Outcome: Progressing   Problem: Activity: Goal: Risk for activity intolerance will decrease Outcome: Progressing

## 2024-02-15 NOTE — Progress Notes (Signed)
 [facility]  PROGRESS NOTE  Monique Henderson FMW:[id number] DOB: 17-Oct-1950 DOA: 02/14/2024 PCP: Shelda Atlas, MD   Brief HPI:   73 y.o. female with medical history significant of ESRD on HD TThS, frequent admissions for fluids overload CVA c/b R hemiparesis, PAF on Eliquis , active smoker, HFpEF(EF 45% in 06/2023) p/w HTN urgency and SOB 2/2 volume overload.  In the ED, pt hypertensive and tachypeneic w/o hypoxia. Labs notable for troponin 75, BNP >4500, BUN/Cr 87/12.86, and anion gap 23. EDP consulted nephrology for emergent dialysis and pt admitted to medicine.  In the ED, pt hypertensive and tachypeneic w/o hypoxia. Labs notable for troponin 75, BNP >4500, BUN/Cr 87/12.86, and anion gap 23. EDP consulted nephrology for emergent dialysis and pt admitted to medicine.     Assessment/Plan:   HTN urgency SOB Volume overload H/o ESRD Hyperkalemia AGMA High suspicion for volume overload from dietary indiscretion vs poor HD compliance; pt reports going to all 3 HD sessions this week -Renal consulted; apprec eval/recs -PTA cincalcet -S/p lokelma  10mg  x1 in ED -Underwent hemodialysis yesterday, repeat hemodialysis today   AFRVR -Cards consulted in ED; apprec recs (NOTE: recommended IV metoprolol  5mg  x1 followed by pta Coreg  25mg  BID) -PTA Coreg  25mg  BID   Hypertension -PTA Coreg , amlodipine ,  Imdur  (30 on HD days, 60 on non HD days) and hydralazine  -HOLD pta clonidine  for now -Continue Coreg , hydralazine  - Allergy recommends to increase the dose of Imdur  on both HD days and no on HD days if blood pressure is not well-controlled on above regimen   PAF -PTA Coreg  and Eliquis     Medications     amLODipine   10 mg Oral Daily   apixaban   2.5 mg Oral BID   carvedilol   25 mg Oral BID WC   cinacalcet   60 mg Oral Q T,Th,Sat-1800   hydrALAZINE   100 mg Oral Q8H   isosorbide  mononitrate  30 mg Oral Once per day on Tuesday Thursday Saturday   [START ON 02/16/2024] isosorbide   mononitrate  60 mg Oral Once per day on Sunday Monday Wednesday Friday   rosuvastatin   10 mg Oral Daily   sodium chloride  flush  10-40 mL Intracatheter Q12H     Data Reviewed:   CBG:  Recent Labs  Lab 02/14/24 0748  GLUCAP 93    SpO2: 94 % O2 Flow Rate (L/min): 4 L/min    Vitals:   02/14/24 2048 02/15/24 0114 02/15/24 0409 02/15/24 0821  BP: (!) 159/136 (!) 149/88 (!) 185/125 (!) 156/90  Pulse: 96 (!) 105 (!) 101 (!) 107  Resp: 18 18  18   Temp: 98.3 F (36.8 C) 99 F (37.2 C) 98.7 F (37.1 C) 98.3 F (36.8 C)  TempSrc:  Oral Oral   SpO2: 93% 93% 94% 94%      Data Reviewed:  Basic Metabolic Panel: Recent Labs  Lab 02/14/24 0347 02/14/24 1020 02/15/24 0440  NA 138  --  134*  K 7.3*  --  4.9  CL 97*  --  96*  CO2 18*  --  23  GLUCOSE 107*  --  78  BUN 87*  --  51*  CREATININE 12.86* 14.07* 8.65*  CALCIUM  9.5  --  9.1    CBC: Recent Labs  Lab 02/14/24 0347 02/14/24 1020 02/15/24 0440  WBC 7.4 6.4 4.7  NEUTROABS 6.5  --   --   HGB 12.3 11.1* 12.5  HCT 40.6 36.2 39.5  MCV 94.2 93.3 92.5  PLT 195 224 204    LFT  Recent Labs  Lab 02/14/24 0347  AST 15  ALT 7  ALKPHOS 62  BILITOT 0.9  PROT 7.8  ALBUMIN  4.0     Antibiotics: Anti-infectives (From admission, onward)    Start     Dose/Rate Route Frequency Ordered Stop   02/14/24 0445  cefTRIAXone  (ROCEPHIN ) 1 g in sodium chloride  0.9 % 100 mL IVPB        1 g 200 mL/hr over 30 Minutes Intravenous  Once 02/14/24 0441 02/14/24 0517   02/14/24 0445  doxycycline  (VIBRA -TABS) tablet 100 mg        100 mg Oral  Once 02/14/24 0441 02/14/24 0447        DVT prophylaxis: Apixaban   Code Status: Full code  Family Communication: No family at bedside   CONSULTS nephrology, cardiology   Subjective   Denies chest pain or shortness of breath   Objective    Physical Examination:  General-appears in no acute distress Heart-S1-S2, regular, no murmur auscultated Lungs-clear to auscultation  bilaterally, no wheezing or crackles auscultated Abdomen-soft, nontender, no organomegaly Extremities-no edema in the lower extremities Neuro-alert, oriented x3, no focal deficit noted  Status is: Inpatient:             Monique Henderson   [facility] If 7PM-7AM, please contact night-coverage at [email or web address], Office  [phone]   02/15/2024, 9:14 AM  LOS: 1 day

## 2024-02-15 NOTE — Procedures (Signed)
 S: pt seen in HD unit, no c/o's at this time  Vitals:   02/15/24 0409 02/15/24 0821 02/15/24 1300 02/15/24 1643  BP: (!) 185/125 (!) 156/90  139/79  Pulse: (!) 101 (!) 107  74  Resp:  18  18  Temp: 98.7 F (37.1 C) 98.3 F (36.8 C)  98.8 F (37.1 C)  TempSrc: Oral     SpO2: 94% 94%  98%  Weight:   47.5 kg   Height:   5' 3 (1.6 m)     Recent Labs  Lab 02/14/24 0347 02/14/24 1020 02/15/24 0440  HGB 12.3 11.1* 12.5  ALBUMIN  4.0  --   --   CALCIUM  9.5  --  9.1  CREATININE 12.86* 14.07* 8.65*  K 7.3*  --  4.9    Inpatient medications:  amLODipine   10 mg Oral Daily   apixaban   2.5 mg Oral BID   carvedilol   25 mg Oral BID WC   cinacalcet   60 mg Oral Q T,Th,Sat-1800   hydrALAZINE   100 mg Oral Q8H   isosorbide  mononitrate  30 mg Oral Once per day on Tuesday Thursday Saturday   [START ON 02/16/2024] isosorbide  mononitrate  60 mg Oral Once per day on Sunday Monday Wednesday Friday   rosuvastatin   10 mg Oral Daily   sodium chloride  flush  10-40 mL Intracatheter Q12H    oxyCODONE , senna-docusate, sodium chloride  flush  I was present at the procedure, reviewed the HD regimen and made appropriate changes.   Myer Fret MD  [facility] 02/14/2024, 3:24 PM

## 2024-02-15 NOTE — Progress Notes (Signed)
 Progress Note  Patient Name: Monique Henderson Date of Encounter: 02/15/2024 Primary Cardiologist: Madonna Large, DO   Subjective   Overnight  blood pressure has increased off HD. Patient notes no CP, SOB, Palpitations.  Vital Signs    Vitals:   02/14/24 2048 02/15/24 0114 02/15/24 0409 02/15/24 0821  BP: (!) 159/136 (!) 149/88 (!) 185/125 (!) 156/90  Pulse: 96 (!) 105 (!) 101 (!) 107  Resp: 18 18  18   Temp: 98.3 F (36.8 C) 99 F (37.2 C) 98.7 F (37.1 C) 98.3 F (36.8 C)  TempSrc:  Oral Oral   SpO2: 93% 93% 94% 94%    Intake/Output Summary (Last 24 hours) at 02/15/2024 9072 Last data filed at 02/15/2024 0200 Gross per 24 hour  Intake 0 ml  Output 3000 ml  Net -3000 ml   Filed Weights    Physical Exam   GEN: No acute distress.  Thin female Neck: No JVD Cardiac: Regular tachycardia, no murmurs, rubs, or gallops.  Respiratory: Clear to auscultation bilaterally. GI: Soft, nontender, non-distended  MS: No edema  Labs   Telemetry: NA (none)   Chemistry Recent Labs  Lab 02/14/24 0347 02/14/24 1020 02/15/24 0440  NA 138  --  134*  K 7.3*  --  4.9  CL 97*  --  96*  CO2 18*  --  23  GLUCOSE 107*  --  78  BUN 87*  --  51*  CREATININE 12.86* 14.07* 8.65*  CALCIUM  9.5  --  9.1  PROT 7.8  --   --   ALBUMIN  4.0  --   --   AST 15  --   --   ALT 7  --   --   ALKPHOS 62  --   --   BILITOT 0.9  --   --   GFRNONAA 3* 3* 5*  ANIONGAP 23*  --  15     Hematology Recent Labs  Lab 02/14/24 0347 02/14/24 1020 02/15/24 0440  WBC 7.4 6.4 4.7  RBC 4.31 3.88 4.27  HGB 12.3 11.1* 12.5  HCT 40.6 36.2 39.5  MCV 94.2 93.3 92.5  MCH 28.5 28.6 29.3  MCHC 30.3 30.7 31.6  RDW 19.2* 18.9* 18.7*  PLT 195 224 204    BNP Recent Labs  Lab 02/14/24 0347  BNP >4,500.0*     Cardiac Studies   Cardiac Studies & Procedures   ______________________________________________________________________________________________   STRESS TESTS  NM MYOCAR MULTI W/SPECT  W 06/21/2021  Narrative CLINICAL DATA:  73 year old female with chest pain and shortness of breath. History of CABG.  EXAM: MYOCARDIAL IMAGING WITH SPECT (REST AND PHARMACOLOGIC-STRESS)  GATED LEFT VENTRICULAR WALL MOTION STUDY  LEFT VENTRICULAR EJECTION FRACTION  TECHNIQUE: Standard myocardial SPECT imaging was performed after resting intravenous injection of 11 mCi Tc-51m tetrofosmin . Subsequently, intravenous infusion of Lexiscan  was performed under the supervision of the Cardiology staff. At peak effect of the drug, 33 mCi Tc-50m tetrofosmin  was injected intravenously and standard myocardial SPECT imaging was performed. Quantitative gated imaging was also performed to evaluate left ventricular wall motion, and estimate left ventricular ejection fraction.  COMPARISON:  None.  FINDINGS: Perfusion: No decreased activity in the left ventricle on stress imaging to suggest reversible ischemia. A moderate fixed apical defect noted compatible with prior infarct/scar.  Wall Motion: Severe apical hypokinesis noted. Mild general hypokinesis and moderate septal hypokinesis noted.  Left Ventricular Ejection Fraction: 41 %  End diastolic volume 127 ml  End systolic volume 75 ml  IMPRESSION: 1.  No reversible ischemia.  Moderate apical scarring/infarct.  2.  Generalized hypokinesis, severe at the apex.  3. Left ventricular ejection fraction 41%. Moderately abnormal LV systolic volume.  4. Non invasive risk stratification*: Intermediate  *2012 Appropriate Use Criteria for Coronary Revascularization Focused Update: J Am Coll Cardiol. 2012;59(9):857-881. http://content.dementiazones.com.aspx?articleid=1201161   Electronically Signed By: Reyes Phi M.D. On: 06/21/2021 11:19   ECHOCARDIOGRAM  ECHOCARDIOGRAM COMPLETE 06/03/2023  Narrative ECHOCARDIOGRAM REPORT    Patient Name:   [name] Date of Exam: 06/03/2023 Medical Rec #:  [id number]          Height:        63.0 in Accession #:    [id number]         Weight:       106.0 lb Date of Birth:  03-12-1951           BSA:          1.476 m Patient Age:    73 years           BP:           131/83 mmHg Patient Gender: F                  HR:           73 bpm. Exam Location:  Inpatient  Procedure: 2D Echo, Color Doppler, Cardiac Doppler and Strain Analysis  Indications:    acute systolic chf  History:        Patient has prior history of Echocardiogram examinations, most recent 06/19/2022. COPD and end stage renal disease. Hepatitis C.; Risk Factors:Hypertension and Dyslipidemia.  Sonographer:    Tinnie Barefoot RDCS Referring Phys: [id number] DARRYLE NED O'NEAL   Sonographer Comments: Global longitudinal strain was attempted. IMPRESSIONS   1. Left ventricular ejection fraction, by estimation, is 45%. The left ventricle has mildly decreased function. The left ventricle demonstrates regional wall motion abnormalities with apical akinesis. There is severe concentric left ventricular hypertrophy. Consider cardiac amyloidosis. Left ventricular diastolic parameters are consistent with Grade II diastolic dysfunction (pseudonormalization). The average left ventricular global longitudinal strain is -3.8 %. The global longitudinal strain is abnormal. 2. Right ventricular systolic function is moderately reduced. The right ventricular size is normal. There is mildly elevated pulmonary artery systolic pressure. The estimated right ventricular systolic pressure is 41.9 mmHg. 3. Left atrial size was moderately dilated. 4. The mitral valve is normal in structure. Trivial mitral valve regurgitation. No evidence of mitral stenosis. 5. Tricuspid valve regurgitation is mild to moderate. 6. The aortic valve is tricuspid. There is mild calcification of the aortic valve. Aortic valve regurgitation is not visualized. No aortic stenosis is present. 7. Aortic dilatation noted. 8. The inferior vena cava is normal in size with  greater than 50% respiratory variability, suggesting right atrial pressure of 3 mmHg.  FINDINGS Left Ventricle: Left ventricular ejection fraction, by estimation, is 45%. The left ventricle has mildly decreased function. The left ventricle demonstrates regional wall motion abnormalities. The average left ventricular global longitudinal strain is -3.8 %. The global longitudinal strain is abnormal. The left ventricular internal cavity size was normal in size. There is severe concentric left ventricular hypertrophy. Left ventricular diastolic parameters are consistent with Grade II diastolic dysfunction (pseudonormalization).  Right Ventricle: The right ventricular size is normal. No increase in right ventricular wall thickness. Right ventricular systolic function is moderately reduced. There is mildly elevated pulmonary artery systolic pressure. The tricuspid regurgitant velocity is 3.12 m/s, and with an assumed right  atrial pressure of 3 mmHg, the estimated right ventricular systolic pressure is 41.9 mmHg.  Left Atrium: Left atrial size was moderately dilated.  Right Atrium: Right atrial size was normal in size.  Pericardium: There is no evidence of pericardial effusion.  Mitral Valve: The mitral valve is normal in structure. Trivial mitral valve regurgitation. No evidence of mitral valve stenosis.  Tricuspid Valve: The tricuspid valve is normal in structure. Tricuspid valve regurgitation is mild to moderate.  Aortic Valve: The aortic valve is tricuspid. There is mild calcification of the aortic valve. Aortic valve regurgitation is not visualized. No aortic stenosis is present.  Pulmonic Valve: The pulmonic valve was normal in structure. Pulmonic valve regurgitation is not visualized.  Aorta: Aortic dilatation noted.  Venous: The inferior vena cava is normal in size with greater than 50% respiratory variability, suggesting right atrial pressure of 3 mmHg.  IAS/Shunts: No atrial level shunt  detected by color flow Doppler.   LEFT VENTRICLE PLAX 2D LVIDd:         4.20 cm   Diastology LVIDs:         3.30 cm   LV e' medial:    5.22 cm/s LV PW:         1.50 cm   LV E/e' medial:  21.8 LV IVS:        1.30 cm   LV e' lateral:   3.70 cm/s LVOT diam:     2.10 cm   LV E/e' lateral: 30.8 LV SV:         50 LV SV Index:   34        2D Longitudinal Strain LVOT Area:     3.46 cm  2D Strain GLS Avg:     -3.8 %   RIGHT VENTRICLE            IVC RV Basal diam:  2.90 cm    IVC diam: 1.50 cm RV S prime:     6.20 cm/s TAPSE (M-mode): 1.1 cm  LEFT ATRIUM           Index        RIGHT ATRIUM           Index LA diam:      4.00 cm 2.71 cm/m   RA Area:     14.60 cm LA Vol (A4C): 59.4 ml 40.23 ml/m  RA Volume:   30.50 ml  20.66 ml/m AORTIC VALVE LVOT Vmax:   85.20 cm/s LVOT Vmean:  53.400 cm/s LVOT VTI:    0.145 m  AORTA Ao Root diam: 2.70 cm Ao Asc diam:  2.90 cm  MITRAL VALVE                TRICUSPID VALVE MV Area (PHT): 3.99 cm     TR Peak grad:   38.9 mmHg MV Decel Time: 190 msec     TR Vmax:        312.00 cm/s MV E velocity: 114.00 cm/s MV A velocity: 106.00 cm/s  SHUNTS MV E/A ratio:  1.08         Systemic VTI:  0.14 m Systemic Diam: 2.10 cm  Dalton McleanMD Electronically signed by Ezra Kanner Signature Date/Time: 06/03/2023/6:04:44 PM    Final   TEE  ECHO INTRAOPERATIVE TEE 04/04/2019  Narrative *INTRAOPERATIVE TRANSESOPHAGEAL REPORT *    Patient Name:   MAUREENA DABBS Date of Exam: 04/04/2019 Medical Rec #:  [id number]          Height:  63.0 in Accession #:    [id number]         Weight:       129.9 lb Date of Birth:  [birth year]           BSA:          1.61 m Patient Age:    68 years           BP:           91/54 mmHg Patient Gender: F                  HR:           59 bpm. Exam Location:  Inpatient  Transesophogeal exam was perform intraoperatively during surgical procedure. Patient was closely monitored under general anesthesia during the  entirety of examination.  Indications:     aortic dissection Sonographer:     Tinnie Barefoot RDCS Performing Phys: [id number] AMNJILD Z ATKINS Diagnosing Phys: Zachary Ee MD  Complications: No known complications during this procedure. POST-OP IMPRESSIONS - Left Ventricle: The left ventricle is unchanged from pre-bypass. - Aorta: A graft was placed in the ascending aorta for repair.post bypass, aortic graft in place, AV competent with no AI. - Aortic Valve: The aortic valve appears unchanged from pre-bypass. - Mitral Valve: The mitral valve appears unchanged from pre-bypass.  PRE-OP  FINDINGS Left Ventricle: The left ventricle has normal systolic function, with an ejection fraction of 60-65%. The cavity size was left ventricular size was not assessed. There is moderately increased left ventricular wall thickness. No evidence of left ventricular regional wall motion abnormalities.  Right Ventricle: The right ventricle has normal systolic function. The cavity was normal. There is no increase in right ventricular wall thickness.  Left Atrium: Left atrial size was normal in size. The left atrial appendage is well visualized and there is no evidence of thrombus present.  Right Atrium: Right atrial size was normal in size. Right atrial pressure is estimated at 10 mmHg.  Interatrial Septum: No atrial level shunt detected by color flow Doppler.  Pericardium: A small pericardial effusion is present. The pericardial effusion is circumferential.  Mitral Valve: The mitral valve is normal in structure. Mitral valve regurgitation is not visualized by color flow Doppler.  Tricuspid Valve: The tricuspid valve was not assessed. Tricuspid valve regurgitation was not visualized by color flow Doppler.  Aortic Valve: The aortic valve is tricuspid There is Mild thickening of the aortic valve Aortic valve regurgitation is trivial by color flow Doppler. There is no stenosis of the aortic valve. There is no  evidence of a vegetation on the aortic valve.  Pulmonic Valve: The pulmonic valve was normal in structure. Pulmonic valve regurgitation is not visualized by color flow Doppler.   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 No intimal flap seen on TEE related to dissection appearance of a hematoma circumferentially around proximal aorta and aortic arch. this descended approximately 20-25cm distally. Dilation of the aorta was seen only in the arch. AV competent and trileaflet.   Zachary Ee MD Electronically signed by Zachary Ee MD Signature Date/Time: 04/04/2019/5:02:01 PM    Final       PYP SCAN  MYOCARDIAL AMYLOID PLANAR AND SPECT 07/19/2023  Interpretation Summary   Myocardial uptake was negative for radiotracer uptake. The visual grade of myocardial uptake relative to the ribs was Grade 0 (No myocardial uptake and normal bone uptake).  Findings are not suggestive (Grade 0) of cardiac ATTR amyloidosis.  ______________________________________________________________________________________________           Assessment & Plan    CP on admission - resolved on exam - with SOB and potential volume overload, s/p HD - Hx of Aortic dissection repair: Echo pending today at 11 AM - Severe HTN; agree with aggressive HD; patient is on home norvasc , maximum dose coreg  25 mg BID (max for weight), Home hydralazine  100 mg PO TID, and Imdur  (30 on HD days, 60 on non HD days) - given prior dissection BP goal is under 130/90.  This may be difficult on HD days - has an echo pending at 10 AM; this will decide if further inpatient cardiac w/u is warranted (no significant troponin elevation) - if no new WMA, will arrange outpatient f/u - consider increasing imdur  dosing both on HD and non HD days if persistent elevated BP despite additional HD  PAF HX of SVT Known RBBB - on low dose eliquis   and BB, largely in SR  Hx of CVA - on statin  COPD ESRD on HD - as per primary    For questions or updates, please contact [facility] Please consult [email or web address] for contact info under Cardiology/STEMI.      Stanly Leavens, MD FASE [facility] Cardiologist [facility]  [address] [place], [address] [place], [place] [address] [phone]  9:28 AM

## 2024-02-15 NOTE — TOC CM/SW Note (Signed)
 Transition of Care [facility]) - Inpatient Brief Assessment   Patient Details  Name: Monique Henderson MRN: [id number] Date of Birth: 19-Oct-1950  Transition of Care [facility]) CM/SW Contact:    Tom-Johnson, Harvest Muskrat, RN Phone Number: 02/15/2024, 2:23 PM   Clinical Narrative:  Patient presented to the ED with worsening Shortness Of Breath, Chest pain and Lt Hip pain. Patient noted to be Hypertensive and Tachypneic with Hypoxia, Creatinine at 12.86 in the ED.  Patient has hx of ESRD, on TTS outpatient HD schedule. Nephrology following for iHD.   Cardiology consulted for Shortness of Breath, Chest pain and Arrhythmia. Patient currently on room air.  CM spoke with patient at bedside about needs for post hospital transition. Patient states she lives with her son, [name]. Modified independent, has all necessary DME's at home. Currently on disability, uses [facility] for transportation to and from HD.  PCP is Shelda Atlas, MD and uses [facility].  No ICM needs or recommendations noted at this time.  Patient not Medically ready for discharge.  CM will continue to follow as patient progresses with care towards discharge.             Transition of Care Asessment: Insurance and Status: Insurance coverage has been reviewed Patient has primary care physician: Yes Home environment has been reviewed: Yes Prior level of function:: Modified Independent Prior/Current Home Services: No current home services Social Drivers of Health Review: SDOH reviewed no interventions necessary Readmission risk has been reviewed: Yes Transition of care needs: no transition of care needs at this time

## 2024-02-15 NOTE — Plan of Care (Signed)
 Monique Henderson has a discharge plan of today after HD. This patient is well educated on her chronic condition and seems to be taking care of herself well. She independently walked and washed herself. We utilized PRN pain medications for her abd pain, but she was able to eat and take all of her prescribed medications.

## 2024-02-15 NOTE — Progress Notes (Addendum)
 [facility] Progress Note   Subjective:   Seen in room - feels much better today s/p HD yesterday with 3L off. K normalized. Denies CP today. BP remains high. For echo today per notes.  Objective Vitals:   02/14/24 2048 02/15/24 0114 02/15/24 0409 02/15/24 0821  BP: (!) 159/136 (!) 149/88 (!) 185/125 (!) 156/90  Pulse: 96 (!) 105 (!) 101 (!) 107  Resp: 18 18  18   Temp: 98.3 F (36.8 C) 99 F (37.2 C) 98.7 F (37.1 C) 98.3 F (36.8 C)  TempSrc:  Oral Oral   SpO2: 93% 93% 94% 94%   Physical Exam General: Better appearing woman, NAD. Room air Heart: RRR; no murmur Lungs: CTAB; no rales Extremities: no LE edema Dialysis Access: R [facility]  Additional Objective Labs: Basic Metabolic Panel: Recent Labs  Lab 02/14/24 0347 02/14/24 1020 02/15/24 0440  NA 138  --  134*  K 7.3*  --  4.9  CL 97*  --  96*  CO2 18*  --  23  GLUCOSE 107*  --  78  BUN 87*  --  51*  CREATININE 12.86* 14.07* 8.65*  CALCIUM  9.5  --  9.1   Liver Function Tests: Recent Labs  Lab 02/14/24 0347  AST 15  ALT 7  ALKPHOS 62  BILITOT 0.9  PROT 7.8  ALBUMIN  4.0   CBC: Recent Labs  Lab 02/14/24 0347 02/14/24 1020 02/15/24 0440  WBC 7.4 6.4 4.7  NEUTROABS 6.5  --   --   HGB 12.3 11.1* 12.5  HCT 40.6 36.2 39.5  MCV 94.2 93.3 92.5  PLT 195 224 204   Studies/Results: DG Chest Port 1 View Result Date: 02/14/2024 EXAM: 1 VIEW XRAY OF THE CHEST 02/14/2024 03:11:00 AM COMPARISON: 10/28/2023 CLINICAL HISTORY: Shortness of breath. FINDINGS: LUNGS AND PLEURA: Lungs are well expanded. Mild interstitial pulmonary edema is present. Superimposed airspace infiltrate is seen at the right lung base which may represent asymmetric pulmonary edema or superimposed infectious process. Trace left pleural effusion. No pneumothorax. HEART AND MEDIASTINUM: Stable moderate cardiomegaly. Median sternotomy has been performed. BONES AND SOFT TISSUES: No acute bone abnormality. LINES, TUBES AND DEVICES: Right  internal jugular hemodialysis catheter tip seen within the right atrium, unchanged. IMPRESSION: 1. Mild interstitial pulmonary edema and superimposed right basilar airspace infiltrate, possibly representing asymmetric pulmonary edema or superimposed infectious process. 2. Trace left pleural effusion. 3. Stable moderate cardiomegaly. Electronically signed by: Dorethia Molt MD 02/14/2024 03:18 AM EDT RP Workstation: [id number]   Medications:   amLODipine   10 mg Oral Daily   apixaban   2.5 mg Oral BID   carvedilol   25 mg Oral BID WC   cinacalcet   60 mg Oral Q T,Th,Sat-1800   hydrALAZINE   100 mg Oral Q8H   isosorbide  mononitrate  30 mg Oral Once per day on Tuesday Thursday Saturday   [START ON 02/16/2024] isosorbide  mononitrate  60 mg Oral Once per day on Sunday Monday Wednesday Friday   rosuvastatin   10 mg Oral Daily   sodium chloride  flush  10-40 mL Intracatheter Q12H    Dialysis Orders AF - TTS 3.5h  B400   43.5kg   2K bath  TDC Heparin  none Last op hd 9/11, post wt 47.9kg (+ 4 kg) Coming off +4kg last 10 days Good compliance w/ HD, stays on     Assessment/ Plan: Acute Hypoxic Resp Failure: Pulm edema on CXR, much improved today s/p HD yesterday. Chest pain: Cardiology following, plan for echo today. Trop 75 -> 75, flat.  Hyperkalemia: Resolved with HD. ESRD: Usual TTS schedule - s/p HD Monday, next HD Thursday although try to fit in extra HD tomorrow either here or as outpatient if she is agreeable. HTN/volume: BP remains high, continue home meds and will try to get volume down further tomorrow. 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  Izetta Boehringer, PA-C 02/15/2024, 10:54 AM  [employer]

## 2024-02-16 DIAGNOSIS — I10 Essential (primary) hypertension: Secondary | ICD-10-CM | POA: Diagnosis not present

## 2024-02-16 DIAGNOSIS — I7111 Aneurysm of the ascending aorta, ruptured: Secondary | ICD-10-CM | POA: Diagnosis not present

## 2024-02-16 DIAGNOSIS — Z9889 Other specified postprocedural states: Secondary | ICD-10-CM | POA: Diagnosis not present

## 2024-02-16 DIAGNOSIS — N186 End stage renal disease: Secondary | ICD-10-CM | POA: Diagnosis not present

## 2024-02-16 MED ORDER — ISOSORBIDE MONONITRATE ER 60 MG PO TB24
60.0000 mg | ORAL_TABLET | Freq: Every day | ORAL | Status: DC
Start: 2024-02-17 — End: 2024-02-16

## 2024-02-16 NOTE — Progress Notes (Signed)
 Progress Note  Patient Name: Monique Henderson Date of Encounter: 02/16/2024 Primary Cardiologist: Madonna Large, DO   Subjective   Overnight  blood pressure has increased again. LVEF 45%.  Apical hypokinesis this was present on last echo cardiogram. Patient notes they didn't bring her back from HD until 3 AM and that she is leaving.  Vital Signs    Vitals:   02/16/24 0205 02/16/24 0313 02/16/24 0646 02/16/24 0801  BP: (!) 189/91 (!) 199/113 (!) 179/94 (!) 199/92  Pulse: (!) 55 79 68 73  Resp: 14 17  18   Temp: 97.8 F (36.6 C) 98.1 F (36.7 C)  98.2 F (36.8 C)  TempSrc: Axillary Oral    SpO2: 92% 97%  97%  Weight: 46 kg     Height:        Intake/Output Summary (Last 24 hours) at 02/16/2024 0958 Last data filed at 02/16/2024 0517 Gross per 24 hour  Intake 110 ml  Output 1500 ml  Net -1390 ml   Filed Weights   02/15/24 1300 02/15/24 2250 02/16/24 0205  Weight: 47.5 kg 47.5 kg 46 kg    Physical Exam   GEN: No acute distress.  Thin female Neck: No JVD Cardiac: Regular rhythm, no murmurs, rubs, or gallops.  Respiratory: Clear to auscultation bilaterally. GI: Soft, nontender, non-distended  MS: No edema  Labs   Telemetry: NA (none)   Chemistry Recent Labs  Lab 02/14/24 0347 02/14/24 1020 02/15/24 0440  NA 138  --  134*  K 7.3*  --  4.9  CL 97*  --  96*  CO2 18*  --  23  GLUCOSE 107*  --  78  BUN 87*  --  51*  CREATININE 12.86* 14.07* 8.65*  CALCIUM  9.5  --  9.1  PROT 7.8  --   --   ALBUMIN  4.0  --   --   AST 15  --   --   ALT 7  --   --   ALKPHOS 62  --   --   BILITOT 0.9  --   --   GFRNONAA 3* 3* 5*  ANIONGAP 23*  --  15     Hematology Recent Labs  Lab 02/14/24 0347 02/14/24 1020 02/15/24 0440  WBC 7.4 6.4 4.7  RBC 4.31 3.88 4.27  HGB 12.3 11.1* 12.5  HCT 40.6 36.2 39.5  MCV 94.2 93.3 92.5  MCH 28.5 28.6 29.3  MCHC 30.3 30.7 31.6  RDW 19.2* 18.9* 18.7*  PLT 195 224 204    BNP Recent Labs  Lab 02/14/24 0347  BNP >4,500.0*      Cardiac Studies   Cardiac Studies & Procedures   ______________________________________________________________________________________________   STRESS TESTS  NM MYOCAR MULTI W/SPECT W 06/21/2021  Narrative CLINICAL DATA:  73 year old female with chest pain and shortness of breath. History of CABG.  EXAM: MYOCARDIAL IMAGING WITH SPECT (REST AND PHARMACOLOGIC-STRESS)  GATED LEFT VENTRICULAR WALL MOTION STUDY  LEFT VENTRICULAR EJECTION FRACTION  TECHNIQUE: Standard myocardial SPECT imaging was performed after resting intravenous injection of 11 mCi Tc-26m tetrofosmin . Subsequently, intravenous infusion of Lexiscan  was performed under the supervision of the Cardiology staff. At peak effect of the drug, 33 mCi Tc-52m tetrofosmin  was injected intravenously and standard myocardial SPECT imaging was performed. Quantitative gated imaging was also performed to evaluate left ventricular wall motion, and estimate left ventricular ejection fraction.  COMPARISON:  None.  FINDINGS: Perfusion: No decreased activity in the left ventricle on stress imaging to suggest reversible ischemia. A  moderate fixed apical defect noted compatible with prior infarct/scar.  Wall Motion: Severe apical hypokinesis noted. Mild general hypokinesis and moderate septal hypokinesis noted.  Left Ventricular Ejection Fraction: 41 %  End diastolic volume 127 ml  End systolic volume 75 ml  IMPRESSION: 1. No reversible ischemia.  Moderate apical scarring/infarct.  2.  Generalized hypokinesis, severe at the apex.  3. Left ventricular ejection fraction 41%. Moderately abnormal LV systolic volume.  4. Non invasive risk stratification*: Intermediate  *2012 Appropriate Use Criteria for Coronary Revascularization Focused Update: J Am Coll Cardiol. 2012;59(9):857-881. http://content.dementiazones.com.aspx?articleid=1201161   Electronically Signed By: Reyes Phi M.D. On: 06/21/2021 11:19    ECHOCARDIOGRAM  ECHOCARDIOGRAM COMPLETE 02/15/2024  Narrative ECHOCARDIOGRAM REPORT    Patient Name:   Monique Henderson Date of Exam: 02/15/2024 Medical Rec #:  [id number]          Height:       63.0 in Accession #:    [id number]         Weight:       103.0 lb Date of Birth:  01/20/1951           BSA:          1.458 m Patient Age:    73 years           BP:           156/90 mmHg Patient Gender: F                  HR:           95 bpm. Exam Location:  Inpatient  Procedure: 2D Echo (Both Spectral and Color Flow Doppler were utilized during procedure).  Indications:    Chest pain  History:        Patient has prior history of Echocardiogram examinations. Ascending aorta repair; Risk Factors:Hypertension.  Sonographer:    Charmaine Gaskins Referring Phys: 8 RHONDA G BARRETT  IMPRESSIONS   1. Left ventricular ejection fraction, by estimation, is 45 to 50%. The left ventricle has mildly decreased function. The left ventricle demonstrates regional wall motion abnormalities (see scoring diagram/findings for description). There is severe concentric left ventricular hypertrophy. Left ventricular diastolic parameters are consistent with Grade II diastolic dysfunction (pseudonormalization). The average left ventricular global longitudinal strain is -7.4 % with apical sparing. The global longitudinal strain is abnormal. Consistent with cardiac amyloid. 2. Right ventricular systolic function is mildly reduced. The right ventricular size is normal. Moderately increased right ventricular wall thickness. 3. Left atrial size was moderately dilated. 4. Right atrial size was moderately dilated. 5. The mitral valve is normal in structure. Trivial mitral valve regurgitation. 6. Tricuspid valve regurgitation is moderate to severe. 7. The aortic valve is grossly normal. There is mild thickening of the aortic valve. Aortic valve regurgitation is trivial. 8. The inferior vena cava is normal in size with  greater than 50% respiratory variability, suggesting right atrial pressure of 3 mmHg.  FINDINGS Left Ventricle: Left ventricular ejection fraction, by estimation, is 45 to 50%. The left ventricle has mildly decreased function. The left ventricle demonstrates regional wall motion abnormalities. The average left ventricular global longitudinal strain is -7.4 %. Strain was performed and the global longitudinal strain is abnormal. The left ventricular internal cavity size was normal in size. There is severe concentric left ventricular hypertrophy. Left ventricular diastolic parameters are consistent with Grade II diastolic dysfunction (pseudonormalization).   LV Wall Scoring: The apex is hypokinetic.  Right Ventricle: The right ventricular size is normal.  Moderately increased right ventricular wall thickness. Right ventricular systolic function is mildly reduced.  Left Atrium: Left atrial size was moderately dilated.  Right Atrium: Right atrial size was moderately dilated.  Pericardium: There is no evidence of pericardial effusion.  Mitral Valve: The mitral valve is normal in structure. Trivial mitral valve regurgitation.  Tricuspid Valve: The tricuspid valve is normal in structure. Tricuspid valve regurgitation is moderate to severe.  Aortic Valve: The aortic valve is grossly normal. There is mild thickening of the aortic valve. Aortic valve regurgitation is trivial.  Pulmonic Valve: The pulmonic valve was normal in structure. Pulmonic valve regurgitation is not visualized.  Aorta: The aortic root is normal in size and structure.  Venous: The inferior vena cava is normal in size with greater than 50% respiratory variability, suggesting right atrial pressure of 3 mmHg.  IAS/Shunts: The interatrial septum was not well visualized.   LEFT VENTRICLE PLAX 2D LVIDd:         4.60 cm      Diastology LVIDs:         3.50 cm      LV e' medial:    5.22 cm/s LV PW:         1.30 cm      LV E/e'  medial:  22.6 LV IVS:        1.20 cm      LV e' lateral:   3.81 cm/s LVOT diam:     2.00 cm      LV E/e' lateral: 31.0 LVOT Area:     3.14 cm 2D Longitudinal Strain 2D Strain GLS Avg:     -7.4 % LV Volumes (MOD) LV vol d, MOD A2C: 106.0 ml LV vol d, MOD A4C: 63.9 ml LV vol s, MOD A2C: 60.7 ml LV vol s, MOD A4C: 35.4 ml LV SV MOD A2C:     45.3 ml LV SV MOD A4C:     63.9 ml LV SV MOD BP:      39.7 ml  RIGHT VENTRICLE RV Basal diam:  3.40 cm RV Mid diam:    3.10 cm RV S prime:     8.49 cm/s TAPSE (M-mode): 1.6 cm  LEFT ATRIUM             Index        RIGHT ATRIUM           Index LA diam:        3.50 cm 2.40 cm/m   RA Area:     20.60 cm LA Vol (A2C):   83.1 ml 56.98 ml/m  RA Volume:   58.90 ml  40.39 ml/m LA Vol (A4C):   54.4 ml 37.30 ml/m LA Biplane Vol: 68.7 ml 47.10 ml/m  AORTA Ao Root diam: 2.60 cm Ao Asc diam:  2.80 cm  MITRAL VALVE MV Area (PHT): 4.96 cm     SHUNTS MV Decel Time: 153 msec     Systemic Diam: 2.00 cm MV E velocity: 118.00 cm/s MV A velocity: 91.70 cm/s MV E/A ratio:  1.29  Aditya Sabharwal Electronically signed by Ria Commander Signature Date/Time: 02/15/2024/1:05:48 PM    Final   TEE  ECHO INTRAOPERATIVE TEE 04/04/2019  Narrative *INTRAOPERATIVE TRANSESOPHAGEAL REPORT *    Patient Name:   HALEEMA VANDERHEYDEN Date of Exam: 04/04/2019 Medical Rec #:  [id number]          Height:       63.0 in Accession #:    [id number]  Weight:       129.9 lb Date of Birth:  1950-12-06           BSA:          1.61 m Patient Age:    68 years           BP:           91/54 mmHg Patient Gender: F                  HR:           59 bpm. Exam Location:  Inpatient  Transesophogeal exam was perform intraoperatively during surgical procedure. Patient was closely monitored under general anesthesia during the entirety of examination.  Indications:     aortic dissection Sonographer:     Tinnie Barefoot RDCS Performing Phys: [id number] AMNJILD Z  ATKINS Diagnosing Phys: Zachary Ee MD  Complications: No known complications during this procedure. POST-OP IMPRESSIONS - Left Ventricle: The left ventricle is unchanged from pre-bypass. - Aorta: A graft was placed in the ascending aorta for repair.post bypass, aortic graft in place, AV competent with no AI. - Aortic Valve: The aortic valve appears unchanged from pre-bypass. - Mitral Valve: The mitral valve appears unchanged from pre-bypass.  PRE-OP  FINDINGS Left Ventricle: The left ventricle has normal systolic function, with an ejection fraction of 60-65%. The cavity size was left ventricular size was not assessed. There is moderately increased left ventricular wall thickness. No evidence of left ventricular regional wall motion abnormalities.  Right Ventricle: The right ventricle has normal systolic function. The cavity was normal. There is no increase in right ventricular wall thickness.  Left Atrium: Left atrial size was normal in size. The left atrial appendage is well visualized and there is no evidence of thrombus present.  Right Atrium: Right atrial size was normal in size. Right atrial pressure is estimated at 10 mmHg.  Interatrial Septum: No atrial level shunt detected by color flow Doppler.  Pericardium: A small pericardial effusion is present. The pericardial effusion is circumferential.  Mitral Valve: The mitral valve is normal in structure. Mitral valve regurgitation is not visualized by color flow Doppler.  Tricuspid Valve: The tricuspid valve was not assessed. Tricuspid valve regurgitation was not visualized by color flow Doppler.  Aortic Valve: The aortic valve is tricuspid There is Mild thickening of the aortic valve Aortic valve regurgitation is trivial by color flow Doppler. There is no stenosis of the aortic valve. There is no evidence of a vegetation on the aortic valve.  Pulmonic Valve: The pulmonic valve was normal in structure. Pulmonic valve regurgitation  is not visualized by color flow Doppler.   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 No intimal flap seen on TEE related to dissection appearance of a hematoma circumferentially around proximal aorta and aortic arch. this descended approximately 20-25cm distally. Dilation of the aorta was seen only in the arch. AV competent and trileaflet.   Zachary Ee MD Electronically signed by Zachary Ee MD Signature Date/Time: 04/04/2019/5:02:01 PM    Final       PYP SCAN  MYOCARDIAL AMYLOID PLANAR AND SPECT 07/19/2023  Interpretation Summary   Myocardial uptake was negative for radiotracer uptake. The visual grade of myocardial uptake relative to the ribs was Grade 0 (No myocardial uptake and normal bone uptake).   Findings are not suggestive (Grade 0) of cardiac ATTR amyloidosis.  ______________________________________________________________________________________________  Assessment & Plan    CP on admission - resolved on exam - with SOB and potential volume overload, s/p HD - Hx of Aortic dissection repair: Echo pending today at 11 AM - Severe HTN; agree with aggressive HD; patient is on home norvasc , maximum dose coreg  25 mg BID (max for weight), Home hydralazine  100 mg PO TID, and Imdur  (30 on HD days, 60 on non HD days) - given prior dissection BP goal is under 130/90.  This may be difficult on HD days - I have reviewed her WMA with her; discussed the pros and cons of heart catheterization; she notes that she feels fine and is leaving today. We discussed optimization of blood pressure and she notes that being here has artificially raised her blood pressure. She notes that she is leaving today.  We discussed the risks of high blood pressure in the setting of aortic diessction  - consider increasing imdur  dosing both on HD and non HD days if persistent elevated BP despite  additional HD  LVH - Severe hypertrophy most prominent in the apex - PYP testing negative - long standing HTN is on the differential  -- EKG is not consistent with apical HCM; though this could be the case this could also me long standing HTN mediated - consider outpatient CMR   PAF HX of SVT Known RBBB - on low dose eliquis  and BB, largely in SR  Hx of CVA - on statin  COPD ESRD on HD - as per primary  We will not pursue LHC this admission in accordance to her wishes.  She has f/u 02/28/24; CMR may be considered however they best change to improve her long term mortality will be optimization of ambulatory blood pressure  For questions or updates, please contact [facility] Please consult [email or web address] for contact info under Cardiology/STEMI.      Stanly Leavens, MD FASE [facility] Cardiologist [facility]  [address] [place], [address] [place], [place] [address] [phone]  9:58 AM

## 2024-02-16 NOTE — Progress Notes (Signed)
 This nurse to patient's room. Patient states she would like her IV removed, as she was told that she is discharged. This RN educates patient on need to keep access until discharge orders have been placed. Dr. Djan contacted in regards to patients concerns, per provider patient is to remain on unit until her BP is controlled. New order for increased IMDUR  placed. Upon this RNS return to patients bedside with update, patient states her son has arrived to the hospital to pick her up and whe would like to leave. This RN explained AMA process to patient and dangers of leaving prior to competing her plan of care. Patient states she understands. Dr. Dorinda urgent messaged about patient's request to leave AMA. Charge RN, Scientist, clinical (histocompatibility and immunogenetics) notified and within secure chat conversation. Patient of unit at 1124, after proper signing of AMA form. Placed in patient's physical chart.

## 2024-02-16 NOTE — Progress Notes (Signed)
 Late note entry 1140 am 9/17 Noted d/c. Contacted out-pt clinic [facility]) and informed of pt d/c and anticipated arrival tomorrow. No further support needed.   Letroy Vazguez Dialysis navigator [phone]

## 2024-02-16 NOTE — Discharge Planning (Signed)
 [facility] Patient Discharge Orders - [facility] CLINIC: [facility]  Patient's name: Monique Henderson Admit/DC Dates: 02/14/2024 - 02/16/2024  DISCHARGE DIAGNOSES: AHRF/pulm edmea -> better with HD  HTN -> still high despite meds + HD, consider ^ meds. CP -> better with HD Hyperkalemia -> better with HD  HD ORDER CHANGES: Heparin  change: no EDW Change: no *s/p 2 HD and still not to EDW but closer* [place] Change: no  ANEMIA MANAGEMENT: Aranesp : Given: no ESA dose for discharge: Per protocol IV Iron dose at discharge: Per protocol Transfusion: Given: no  BONE/MINERAL MEDICATIONS: Hectorol /Calcitriol change: no Sensipar /Parsabiv change: no  ACCESS INTERVENTION/CHANGE: no Details:  RECENT LABS: Recent Labs  Lab 02/14/24 0347 02/14/24 1020 02/15/24 0440  HGB 12.3   < > 12.5  NA 138  --  134*  K 7.3*  --  4.9  CALCIUM  9.5  --  9.1  ALBUMIN  4.0  --   --    < > = values in this interval not displayed.   IV ANTIBIOTICS: no Details:  OTHER ANTICOAGULATION: no Details:  OTHER/APPTS/LAB ORDERS: - Cardiology saw her this admit, abnormal echo unchanged from prior, LHC discussed but pt refused to stay inpatient for now. Does have f/u 9/29 with plan to discuss further at that time.  D/C Meds to be reconciled by nurse after every discharge.  Completed By: Izetta Boehringer, PA-C [facility] Pager [phone]   Reviewed by: MD:______ RN_______

## 2024-02-16 NOTE — Progress Notes (Signed)
 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 Unable to order bactroban  and Chlorhexidine  gluconate per protocol as patient is allergic to both per list of allergies.

## 2024-02-16 NOTE — Progress Notes (Signed)
  Received patient in bed to unit.  Alert and oriented.  Informed consent signed and in chart.   TX duration: 3 Hours  Patient tolerated well. Cramping x's 2, Supported by 100 ml NS bolus and decreased UFR Transported back to the room  Alert, without acute distress.  Hand-off given to patient's nurse. Armenta Dines, RN  Access used: Dialysis Catheter Access issues: None  Total UF removed: 1500 Medication(s) given: None Post HD VS: [id number] B/P 189/91 Post HD weight: 46  Neville Seip, RN [facility]   02/16/24 0205  Vitals  Temp 97.8 F (36.6 C)  Temp Source Axillary  BP (!) 189/91  MAP (mmHg) 114  BP Location Left Arm  BP Method Automatic  Patient Position (if appropriate) Lying  Pulse Rate (!) 55  Pulse Rate Source Monitor  ECG Heart Rate 78  Resp 14  Weight 46 kg  Type of Weight Post-Dialysis  Oxygen  Therapy  SpO2 92 %  O2 Device Room Air  During Treatment Monitoring  Blood Flow Rate (mL/min) 0 mL/min  Arterial Pressure (mmHg) -20.8 mmHg  Venous Pressure (mmHg) 40.8 mmHg  TMP (mmHg) -3.03 mmHg  Ultrafiltration Rate (mL/min) 0 mL/min  Dialysate Flow Rate (mL/min) 299 ml/min  Dialysate Potassium Concentration 2  Dialysate Calcium  Concentration 2.5  Duration of HD Treatment -hour(s) 3 hour(s)  Cumulative Fluid Removed (mL) per Treatment  1479.49  HD Safety Checks Performed Yes  Intra-Hemodialysis Comments Tx completed  Post Treatment  Dialyzer Clearance Clear  Liters Processed 72  Fluid Removed (mL) 1500 mL  Tolerated HD Treatment No (Comment)  Post-Hemodialysis Comments cramping x's 2, UF as tolerated  Hemodialysis Catheter Right Subclavian Double lumen Permanent (Tunneled)  Placement Date/Time: 05/28/23 0942   Placed prior to admission: No  Serial / Lot #: [id number]  Expiration Date: 04/01/27  Time Out: Correct patient;Correct site;Correct procedure  Maximum sterile barrier precautions: Sterile gloves;Sterile gown;Mask;C...  Site  Condition No complications  Blue Lumen Status Flushed;Antimicrobial dead end cap;Heparin  locked  Red Lumen Status Flushed;Antimicrobial dead end cap;Heparin  locked  Purple Lumen Status N/A  Catheter fill solution Heparin  1000 units/ml  Catheter fill volume (Arterial) 1.9 cc  Catheter fill volume (Venous) 1.9  Dressing Type Transparent  Dressing Status Antimicrobial disc/dressing in place;Clean, Dry, Intact  Drainage Description None  Dressing Change Due 02/21/24  Post treatment catheter status Capped and Clamped

## 2024-02-16 NOTE — Progress Notes (Signed)
 Heart Failure Navigator Progress Note  Assessed for Heart & Vascular TOC clinic readiness.  Patient does not meet criteria due to ESRD on hemodialysis. No HF TOC.   Navigator will sign off at this time.   Randie Bustle, BSN, Scientist, clinical (histocompatibility and immunogenetics) Only

## 2024-02-16 NOTE — Discharge Summary (Addendum)
 Physician Discharge Summary   Patient: Monique Henderson MRN: [id number] DOB: 06/23/1950  Admit date:     02/14/2024  Discharge date: 02/16/24  Discharge Physician: Drue ONEIDA Potter   PCP: Monique Atlas, MD   Recommendations at discharge:  Patient left AGAINST MEDICAL ADVICE She is to follow-up with nephrology as well as cardiology  Discharge Diagnoses:  HTN urgency SOB Volume overload H/o ESRD Hyperkalemia AGMA AFRVR Hypertension PAF  Hospital Course: 73 y.o. female with medical history significant of ESRD on HD TThS, frequent admissions for fluids overload CVA c/b R hemiparesis, PAF on Eliquis , active smoker, HFpEF(EF 45% in 06/2023) p/w HTN urgency and SOB 2/2 volume overload.  In the ED, pt hypertensive and tachypeneic w/o hypoxia. Labs notable for troponin 75, BNP >4500, BUN/Cr 87/12.86, and anion gap 23. EDP consulted nephrology for emergent dialysis and pt admitted to medicine.  In the ED, pt hypertensive and tachypeneic w/o hypoxia. Labs notable for troponin 75, BNP >4500, BUN/Cr 87/12.86, and anion gap 23. EDP consulted nephrology for emergent dialysis and pt admitted to medicine.  I took over the management of this patient on 02/16/2024. Cardiologist had discussion with the patient concerning the pros and cons of catheterization as well as proper management and control of her blood pressure before discharge.  Patient has stated to cardiologist she is leaving today.  Patient would not wait for her blood pressure to be controlled and sign out to leave AGAINST MEDICAL ADVICE.  She was advised to take 60 mg of Imdur  on all days including both dialysis day and nondialysis days.   Consultants: Cardiology, nephrology Procedures performed: Dialysis Disposition: Home Diet recommendation:  Cardiac diet DISCHARGE MEDICATION: Allergies as of 02/16/2024       Reactions   Shrimp [shellfish Allergy] Shortness Of Breath   Bactroban  [mupirocin ] Other (See Comments)   Sores in nose    Hydrocodone  Itching, Nausea And Vomiting, Nausea Only   Chlorhexidine  Gluconate Itching   Latex Itching   Latex gloves cause itchiness and a rash   Tylenol  [acetaminophen ] Itching   Takes Percocet at home 05/06/23   Zestril  [lisinopril ] Cough        Medication List     TAKE these medications    acetaminophen  325 MG tablet Commonly known as: TYLENOL  Take 650 mg by mouth every 6 (six) hours as needed for mild pain (pain score 1-3) or moderate pain (pain score 4-6).   albuterol  (2.5 MG/3ML) 0.083% nebulizer solution Commonly known as: PROVENTIL  Take 3 mLs (2.5 mg total) by nebulization every 6 (six) hours as needed for wheezing or shortness of breath.   albuterol  108 (90 Base) MCG/ACT inhaler Commonly known as: VENTOLIN  HFA Inhale 2 puffs into the lungs every 6 (six) hours as needed.   amLODipine  10 MG tablet Commonly known as: NORVASC  Take 1 tablet (10 mg total) by mouth daily.   apixaban  2.5 MG Tabs tablet Commonly known as: ELIQUIS  Take 1 tablet (2.5 mg total) by mouth 2 (two) times daily.   carvedilol  25 MG tablet Commonly known as: COREG  Take 1 tablet (25 mg total) by mouth 2 (two) times daily with a meal.   cinacalcet  60 MG tablet Commonly known as: SENSIPAR  Take 60 mg by mouth every dialysis (Tuesday, Thursday, and Saturday).   cloNIDine  0.1 MG tablet Commonly known as: Catapres  Take 1 tablet (0.1 mg total) by mouth 2 (two) times daily. Do not take it before dialysis   hydrALAZINE  100 MG tablet Commonly known as: APRESOLINE  Take 1 tablet (100  mg total) by mouth every 8 (eight) hours.   isosorbide  mononitrate 30 MG 24 hr tablet Commonly known as: IMDUR  TAKE 1 TABLET (30MG ) ON DIALYSIS DAYS AND 2 TABLETS (60 MG) ON NON DIALYSIS DAYS. What changed: See the new instructions.   Lokelma  10 g Pack packet Generic drug: sodium zirconium cyclosilicate  Take 10 g by mouth every Monday, Wednesday, and Friday.   naloxone  4 MG/0.1ML Liqd nasal spray kit Commonly  known as: NARCAN  Place 1 spray into the nose as needed (opioid overdose).   oxyCODONE -acetaminophen  10-325 MG tablet Commonly known as: PERCOCET Take 1 tablet by mouth in the morning, at noon, and at bedtime.   pantoprazole  40 MG tablet Commonly known as: PROTONIX  TAKE 1 TABLET (40 MG TOTAL) BY MOUTH 2 (TWO) TIMES DAILY.   rosuvastatin  10 MG tablet Commonly known as: CRESTOR  TAKE 1 TABLET (10 MG TOTAL) BY MOUTH DAILY FOR CHOLESTEROL   sevelamer  carbonate 2.4 g Pack Commonly known as: RENVELA  Take 2.4 g by mouth daily.   traZODone  50 MG tablet Commonly known as: DESYREL  Take 25-50 mg by mouth at bedtime as needed for sleep.   Vitamin D -3 125 MCG (5000 UT) Tabs Take 1 tablet by mouth in the morning.        Follow-up Information     Monique Stanly LABOR, MD. Call.   Specialty: Cardiology Contact information: [address] [place] [address] [phone]         Monique Charleston, MD. Call.   Specialty: Nephrology Contact information: [address] [place] [address] [phone]                Discharge Exam: Monique Henderson   02/15/24 1300 02/15/24 2250 02/16/24 0205  Weight: 47.5 kg 47.5 kg 46 kg    General-appears in no acute distress Heart-S1-S2, regular, no murmur auscultated Lungs-clear to auscultation bilaterally, no wheezing or crackles auscultated Abdomen-soft, nontender, no organomegaly Extremities-no edema in the lower extremities Neuro-alert, oriented x3, no focal deficit noted    Condition at discharge: good  The results of significant diagnostics from this hospitalization (including imaging, microbiology, ancillary and laboratory) are listed below for reference.   Imaging Studies: ECHOCARDIOGRAM COMPLETE Result Date: 02/15/2024    ECHOCARDIOGRAM REPORT   Patient Name:   Monique Henderson Date of Exam: 02/15/2024 Medical Rec #:  [id number]          Height:       63.0 in Accession #:    [id number]         Weight:       103.0  lb Date of Birth:  07-13-1950           BSA:          1.458 m Patient Age:    73 years           BP:           156/90 mmHg Patient Gender: F                  HR:           95 bpm. Exam Location:  Inpatient Procedure: 2D Echo (Both Spectral and Color Flow Doppler were utilized during            procedure). Indications:    Chest pain  History:        Patient has prior history of Echocardiogram examinations.                 Ascending aorta repair;  Risk Factors:Hypertension.  Sonographer:    Charmaine Gaskins Referring Phys: 64 RHONDA G BARRETT IMPRESSIONS  1. Left ventricular ejection fraction, by estimation, is 45 to 50%. The left ventricle has mildly decreased function. The left ventricle demonstrates regional wall motion abnormalities (see scoring diagram/findings for description). There is severe concentric left ventricular hypertrophy. Left ventricular diastolic parameters are consistent with Grade II diastolic dysfunction (pseudonormalization). The average left ventricular global longitudinal strain is -7.4 % with apical sparing. The global longitudinal strain is abnormal. Consistent with cardiac amyloid.  2. Right ventricular systolic function is mildly reduced. The right ventricular size is normal. Moderately increased right ventricular wall thickness.  3. Left atrial size was moderately dilated.  4. Right atrial size was moderately dilated.  5. The mitral valve is normal in structure. Trivial mitral valve regurgitation.  6. Tricuspid valve regurgitation is moderate to severe.  7. The aortic valve is grossly normal. There is mild thickening of the aortic valve. Aortic valve regurgitation is trivial.  8. The inferior vena cava is normal in size with greater than 50% respiratory variability, suggesting right atrial pressure of 3 mmHg. FINDINGS  Left Ventricle: Left ventricular ejection fraction, by estimation, is 45 to 50%. The left ventricle has mildly decreased function. The left ventricle demonstrates regional  wall motion abnormalities. The average left ventricular global longitudinal strain is -7.4 %. Strain was performed and the global longitudinal strain is abnormal. The left ventricular internal cavity size was normal in size. There is severe concentric left ventricular hypertrophy. Left ventricular diastolic parameters are consistent with Grade II diastolic dysfunction (pseudonormalization).  LV Wall Scoring: The apex is hypokinetic. Right Ventricle: The right ventricular size is normal. Moderately increased right ventricular wall thickness. Right ventricular systolic function is mildly reduced. Left Atrium: Left atrial size was moderately dilated. Right Atrium: Right atrial size was moderately dilated. Pericardium: There is no evidence of pericardial effusion. Mitral Valve: The mitral valve is normal in structure. Trivial mitral valve regurgitation. Tricuspid Valve: The tricuspid valve is normal in structure. Tricuspid valve regurgitation is moderate to severe. Aortic Valve: The aortic valve is grossly normal. There is mild thickening of the aortic valve. Aortic valve regurgitation is trivial. Pulmonic Valve: The pulmonic valve was normal in structure. Pulmonic valve regurgitation is not visualized. Aorta: The aortic root is normal in size and structure. Venous: The inferior vena cava is normal in size with greater than 50% respiratory variability, suggesting right atrial pressure of 3 mmHg. IAS/Shunts: The interatrial septum was not well visualized.  LEFT VENTRICLE PLAX 2D LVIDd:         4.60 cm      Diastology LVIDs:         3.50 cm      LV e' medial:    5.22 cm/s LV PW:         1.30 cm      LV E/e' medial:  22.6 LV IVS:        1.20 cm      LV e' lateral:   3.81 cm/s LVOT diam:     2.00 cm      LV E/e' lateral: 31.0 LVOT Area:     3.14 cm                             2D Longitudinal Strain  2D Strain GLS Avg:     -7.4 % LV Volumes (MOD) LV vol d, MOD A2C: 106.0 ml LV vol d, MOD A4C: 63.9  ml LV vol s, MOD A2C: 60.7 ml LV vol s, MOD A4C: 35.4 ml LV SV MOD A2C:     45.3 ml LV SV MOD A4C:     63.9 ml LV SV MOD BP:      39.7 ml RIGHT VENTRICLE RV Basal diam:  3.40 cm RV Mid diam:    3.10 cm RV S prime:     8.49 cm/s TAPSE (M-mode): 1.6 cm LEFT ATRIUM             Index        RIGHT ATRIUM           Index LA diam:        3.50 cm 2.40 cm/m   RA Area:     20.60 cm LA Vol (A2C):   83.1 ml 56.98 ml/m  RA Volume:   58.90 ml  40.39 ml/m LA Vol (A4C):   54.4 ml 37.30 ml/m LA Biplane Vol: 68.7 ml 47.10 ml/m   AORTA Ao Root diam: 2.60 cm Ao Asc diam:  2.80 cm MITRAL VALVE MV Area (PHT): 4.96 cm     SHUNTS MV Decel Time: 153 msec     Systemic Diam: 2.00 cm MV E velocity: 118.00 cm/s MV A velocity: 91.70 cm/s MV E/A ratio:  1.29 Aditya Sabharwal Electronically signed by Ria Commander Signature Date/Time: 02/15/2024/1:05:48 PM    Final    DG Chest Port 1 View Result Date: 02/14/2024 EXAM: 1 VIEW XRAY OF THE CHEST 02/14/2024 03:11:00 AM COMPARISON: 10/28/2023 CLINICAL HISTORY: Shortness of breath. FINDINGS: LUNGS AND PLEURA: Lungs are well expanded. Mild interstitial pulmonary edema is present. Superimposed airspace infiltrate is seen at the right lung base which may represent asymmetric pulmonary edema or superimposed infectious process. Trace left pleural effusion. No pneumothorax. HEART AND MEDIASTINUM: Stable moderate cardiomegaly. Median sternotomy has been performed. BONES AND SOFT TISSUES: No acute bone abnormality. LINES, TUBES AND DEVICES: Right internal jugular hemodialysis catheter tip seen within the right atrium, unchanged. IMPRESSION: 1. Mild interstitial pulmonary edema and superimposed right basilar airspace infiltrate, possibly representing asymmetric pulmonary edema or superimposed infectious process. 2. Trace left pleural effusion. 3. Stable moderate cardiomegaly. Electronically signed by: Dorethia Molt MD 02/14/2024 03:18 AM EDT RP Workstation: [id number]    Microbiology: Results for  orders placed or performed during the hospital encounter of 02/14/24  Culture, blood (Routine X 2) w Reflex to ID Panel     Status: None (Preliminary result)   Collection Time: 02/14/24  3:21 AM   Specimen: BLOOD LEFT HAND  Result Value Ref Range Status   Specimen Description BLOOD LEFT HAND  Final   Special Requests   Final    BOTTLES DRAWN AEROBIC ONLY Blood Culture results may not be optimal due to an inadequate volume of blood received in culture bottles   Culture   Final    NO GROWTH 2 DAYS Performed at [facility], [address], [place], [place] [address]    Report Status PENDING  Incomplete  Culture, blood (Routine X 2) w Reflex to ID Panel     Status: None (Preliminary result)   Collection Time: 02/14/24  3:26 AM   Specimen: BLOOD LEFT FOREARM  Result Value Ref Range Status   Specimen Description BLOOD LEFT FOREARM  Final   Special Requests   Final    BOTTLES  DRAWN AEROBIC AND ANAEROBIC Blood Culture adequate volume   Culture   Final    NO GROWTH 2 DAYS Performed at [facility], [address], [place], [place] [address]    Report Status PENDING  Incomplete  MRSA Next Gen by PCR, Nasal     Status: Abnormal   Collection Time: 02/15/24  7:32 PM   Specimen: Nasal Mucosa; Nasal Swab  Result Value Ref Range Status   MRSA by PCR Next Gen DETECTED (A) NOT DETECTED Final    Comment: RESULT CALLED TO, READ BACK BY AND VERIFIED WITH: RN CHARLENA DEE [phone] @ 2136 FH (NOTE) The GeneXpert MRSA Assay (FDA approved for NASAL specimens only), is one component of a comprehensive MRSA colonization surveillance program. It is not intended to diagnose MRSA infection nor to guide or monitor treatment for MRSA infections. Test performance is not FDA approved in patients less than 50 years old. Performed at [facility], [address], [place], [place] [address]    *Note: Due to a large number of results and/or encounters for the requested time period, some results  have not been displayed. A complete set of results can be found in Results Review.    Labs: CBC: Recent Labs  Lab 02/14/24 0347 02/14/24 1020 02/15/24 0440  WBC 7.4 6.4 4.7  NEUTROABS 6.5  --   --   HGB 12.3 11.1* 12.5  HCT 40.6 36.2 39.5  MCV 94.2 93.3 92.5  PLT 195 224 204   Basic Metabolic Panel: Recent Labs  Lab 02/14/24 0347 02/14/24 1020 02/15/24 0440  NA 138  --  134*  K 7.3*  --  4.9  CL 97*  --  96*  CO2 18*  --  23  GLUCOSE 107*  --  78  BUN 87*  --  51*  CREATININE 12.86* 14.07* 8.65*  CALCIUM  9.5  --  9.1   Liver Function Tests: Recent Labs  Lab 02/14/24 0347  AST 15  ALT 7  ALKPHOS 62  BILITOT 0.9  PROT 7.8  ALBUMIN  4.0   CBG: Recent Labs  Lab 02/14/24 0748  GLUCAP 93    Discharge time spent:  35 minutes.  Signed: Drue ONEIDA Potter, MD [facility] 02/16/2024

## 2024-02-17 ENCOUNTER — Telehealth (HOSPITAL_COMMUNITY): Payer: Self-pay | Admitting: Nephrology

## 2024-02-17 NOTE — Telephone Encounter (Signed)
 Transition of care contact from inpatient facility  Date of Discharge: 02/16/2024 Date of Contact: 02/17/2024 - attempt #1 Method of contact: Phone  Attempted to contact patient to discuss transition of care from inpatient admission. Patient did not answer the phone. Message was left on the patient's voicemail with call back number [phone].  Of note, it does NOT appear that she made it to her usual HD today.  Izetta Boehringer, PA-C [employer] Pager [phone]

## 2024-02-19 LAB — CULTURE, BLOOD (ROUTINE X 2)
Culture: NO GROWTH
Culture: NO GROWTH
Special Requests: ADEQUATE

## 2024-02-28 ENCOUNTER — Ambulatory Visit: Admitting: Cardiology

## 2024-03-10 ENCOUNTER — Ambulatory Visit: Admitting: Cardiology

## 2024-03-13 ENCOUNTER — Encounter: Payer: Self-pay | Admitting: Cardiology

## 2024-03-13 ENCOUNTER — Ambulatory Visit: Attending: Cardiology | Admitting: Cardiology

## 2024-03-13 VITALS — BP 130/72 | HR 74 | Ht 63.0 in | Wt 105.0 lb

## 2024-03-13 DIAGNOSIS — I10 Essential (primary) hypertension: Secondary | ICD-10-CM

## 2024-03-13 DIAGNOSIS — I6523 Occlusion and stenosis of bilateral carotid arteries: Secondary | ICD-10-CM | POA: Diagnosis not present

## 2024-03-13 DIAGNOSIS — N186 End stage renal disease: Secondary | ICD-10-CM

## 2024-03-13 DIAGNOSIS — D6869 Other thrombophilia: Secondary | ICD-10-CM | POA: Diagnosis not present

## 2024-03-13 DIAGNOSIS — Z8679 Personal history of other diseases of the circulatory system: Secondary | ICD-10-CM

## 2024-03-13 DIAGNOSIS — I5022 Chronic systolic (congestive) heart failure: Secondary | ICD-10-CM

## 2024-03-13 DIAGNOSIS — Z992 Dependence on renal dialysis: Secondary | ICD-10-CM

## 2024-03-13 DIAGNOSIS — I4819 Other persistent atrial fibrillation: Secondary | ICD-10-CM | POA: Diagnosis not present

## 2024-03-13 DIAGNOSIS — I517 Cardiomegaly: Secondary | ICD-10-CM

## 2024-03-13 DIAGNOSIS — I480 Paroxysmal atrial fibrillation: Secondary | ICD-10-CM

## 2024-03-13 NOTE — Patient Instructions (Addendum)
 Medication Instructions:  Your physician recommends that you continue on your current medications as directed. Please refer to the Current Medication list given to you today.  *If you need a refill on your cardiac medications before your next appointment, please call your pharmacy*  Lab Work: Have lab work done in the lab on the first floor today--Hgb and Hct If you have labs (blood work) drawn today and your tests are completely normal, you will receive your results only by: MyChart Message (if you have MyChart) OR A paper copy in the mail If you have any lab test that is abnormal or we need to change your treatment, we will call you to review the results.  Testing/Procedures: Dr Michele has requested you have a cardiac MRI  Follow-Up: At [facility], you and your health needs are our priority.  As part of our continuing mission to provide you with exceptional heart care, our providers are all part of one team.  This team includes your primary Cardiologist (physician) and Advanced Practice Providers or APPs (Physician Assistants and Nurse Practitioners) who all work together to provide you with the care you need, when you need it.  Your next appointment:   6 month(s)  Provider:   Madonna Michele, DO    We recommend signing up for the patient portal called MyChart.  Sign up information is provided on this After Visit Summary.  MyChart is used to connect with patients for Virtual Visits (Telemedicine).  Patients are able to view lab/test results, encounter notes, upcoming appointments, etc.  Non-urgent messages can be sent to your provider as well.   To learn more about what you can do with MyChart, go to [email or web address].   Other Instructions You have been referred to see the pharmacist in our office.  Please schedule new patient appointment    You are scheduled for Cardiac MRI at the location below.  Please arrive for your appointment at ______________ . ?  [facility] [address] [place], [place] [address] Please take advantage of the free valet parking available at the [facility] and [employer] (Entrance C).  Proceed to the [facility] Radiology Department (First Floor) for check-in.   OR   [facility] [address] [place], [place] [address] Please go to the [facility] and check-in with the desk attendant.   Magnetic resonance imaging (MRI) is a painless test that produces images of the inside of the body without using Xrays.  During an MRI, strong magnets and radio waves work together in a Data processing manager to form detailed images.   MRI images may provide more details about a medical condition than X-rays, CT scans, and ultrasounds can provide.  You may be given earphones to listen for instructions.  You may eat a light breakfast and take medications as ordered with the exception of furosemide , hydrochlorothiazide, chlorthalidone or spironolactone (or any other fluid pill). If you are undergoing a stress MRI, please avoid stimulants for 12 hr prior to test. (I.e. Caffeine, nicotine , chocolate, or antihistamine medications)  If your provider has ordered anti-anxiety medications for this test, then you will need a driver.  An IV will be inserted into one of your veins. Contrast material will be injected into your IV. It will leave your body through your urine within a day. You may be told to drink plenty of fluids to help flush the contrast material out of your system.  You will be asked to remove all metal,  including: Watch, jewelry, and other metal objects including hearing aids, hair pieces and dentures. Also wearable glucose monitoring systems (ie. Freestyle Libre and Omnipods) (Braces and fillings normally are not a problem.)   TEST WILL TAKE APPROXIMATELY 1 HOUR  PLEASE NOTIFY SCHEDULING AT LEAST 24 HOURS IN ADVANCE IF YOU ARE UNABLE TO KEEP YOUR APPOINTMENT. [phone]  For more  information and frequently asked questions, please visit our website : [email or web address]  Please call the Cardiac Imaging Nurse Navigators with any questions/concerns. [phone] Office

## 2024-03-13 NOTE — Progress Notes (Signed)
 Cardiology Office Note:  .   Date:  03/13/2024  ID:  Monique Henderson, DOB [birth year], MRN [id number] PCP:  Shelda Atlas, MD  Former Cardiology Providers: Dr. Candyce Reek [facility] Providers Cardiologist:  Madonna Large, DO , [facility] (established care 11/12/2023) Electrophysiologist:  None  Click to update primary MD,subspecialty MD or APP then REFRESH:1}    Chief Complaint  Patient presents with   Follow-up    Hospital follow up     History of Present Illness: .   Monique Henderson is a 73 y.o. 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  Patient has a complex cardiovascular history as outlined above and has been noncompliant with medical therapy as well as hemodialysis and has been under the care of multiple cardiovascular providers in the past.  Patient was last seen the clinic in June 2025.  Patient was recommended to follow-up with cardiothoracic surgery given her history of aortic dissection.  She did follow-up with Dr. Sherrine in November 2025 and was noted to be stable at that time and to follow-up with them at as needed basis.   Patient was hospitalized in September 2025 for chest pain on admission which had resolved based on the progress note.  Echocardiogram noted mildly reduced LVEF with regional wall motion abnormalities.  Patient was recommended to undergo left heart catheterization but she refused.  Blood pressure medications were uptitrated.  She has known left  ventricular hypertrophy on physical examination which was felt to be secondary to hypertension.  An outpatient cardiac MRI recommended.  Since hospital discharge patient has not had any anginal chest pain. Shortness of breath is chronic and stable. Blood pressures postdialysis are 126-135 mmHg per patient. Patient states that she does not have a blood pressure cuff to check on nondialysis days. Reviewed the results of the carotid duplex from 12/01/2023 as well as echocardiogram from 01/2024.  Review of Systems: .   Review of Systems  Cardiovascular:  Negative for chest pain, claudication, irregular heartbeat, leg swelling, near-syncope, orthopnea, palpitations, paroxysmal nocturnal dyspnea and syncope.  Respiratory:  Positive for shortness of breath (chronic and stable).   Hematologic/Lymphatic: Negative for bleeding problem.    Studies Reviewed:   Echocardiogram: 06/2023: LVEF 45%, severe LVH. Grade 2 diastolic dysfunction. Right ventricular function moderately reduced, right ventricular size normal. Left atrium moderately dilated. No significant valvular heart disease. Estimated RAP 3 mmHg  02/15/2024  1. Left ventricular ejection fraction, by estimation, is 45 to 50%. The  left ventricle has mildly decreased function. The left ventricle  demonstrates regional wall motion abnormalities (see scoring  diagram/findings for description). There is severe  concentric left ventricular hypertrophy. Left ventricular diastolic  parameters are consistent with Grade II diastolic dysfunction  (pseudonormalization). The average left ventricular global longitudinal  strain is -7.4 % with apical sparing. The global  longitudinal strain is abnormal. Consistent with cardiac amyloid.   2. Right ventricular systolic function is mildly reduced. The right  ventricular size is normal. Moderately increased right ventricular wall  thickness.   3. Left atrial size was moderately dilated.  4. Right  atrial size was moderately dilated.   5. The mitral valve is normal in structure. Trivial mitral valve  regurgitation.   6. Tricuspid valve regurgitation is moderate to severe.   7. The aortic valve is grossly normal. There is mild thickening of the  aortic valve. Aortic valve regurgitation is trivial.   8. The inferior vena cava is normal in size with greater than 50%  respiratory variability, suggesting right atrial pressure of 3 mmHg.   PYP Scan 07/2023   Myocardial uptake was negative for radiotracer uptake. The visual grade of myocardial uptake relative to the ribs was Grade 0 (No myocardial uptake and normal bone uptake).   Findings are not suggestive (Grade 0) of cardiac ATTR amyloidosis.  MPI  06/2021 1. No reversible ischemia.  Moderate apical scarring/infarct.   2.  Generalized hypokinesis, severe at the apex.   3. Left ventricular ejection fraction 41%. Moderately abnormal LV systolic volume.   4. Non invasive risk stratification*: Intermediate  Carotid Duplex  05/22/2019 RICA 40-59% stenosis. The ECA appears occluded.  LICA 40-59% stenosis.  Vertebrals: Bilateral vertebral arteries demonstrate antegrade flow.   12/01/2023 Right Carotid: The extracranial vessels were near-normal with only minimal wall thickening or plaque.   Left Carotid: The extracranial vessels were near-normal with only minimal wall thickening or plaque.   Vertebrals:  Bilateral vertebral arteries demonstrate antegrade flow.  Subclavians: Normal flow hemodynamics were seen in bilateral subclavian arteries.   RADIOLOGY: CT Angio Chest/Abd/Pel for Dissection W and/or Wo Contrast  08/21/2023 1. No evidence of aortic dissection. Similar postsurgical changes of the ascending aorta. See report for additional details  Risk Assessment/Calculations:   Click Here to Calculate/Change CHADS2VASc Score The patient's CHADS2-VASc score is 7, indicating a 11.2% annual risk of stroke.     Labs:        Latest Ref Rng & Units 02/15/2024    4:40 AM 02/14/2024   10:20 AM 02/14/2024    3:47 AM  CBC  WBC 4.0 - 10.5 K/uL 4.7  6.4  7.4   Hemoglobin 12.0 - 15.0 g/dL 87.4  88.8  87.6   Hematocrit 36.0 - 46.0 % 39.5  36.2  40.6   Platelets 150 - 400 K/uL 204  224  195        Latest Ref Rng & Units 02/15/2024    4:40 AM 02/14/2024   10:20 AM 02/14/2024    3:47 AM  BMP  Glucose 70 - 99 mg/dL 78   892   BUN 8 - 23 mg/dL 51   87   Creatinine 9.55 - 1.00 mg/dL 1.34  85.92  87.13   Sodium 135 - 145 mmol/L 134   138   Potassium 3.5 - 5.1 mmol/L 4.9   7.3   Chloride 98 - 111 mmol/L 96   97   CO2 22 - 32 mmol/L 23   18   Calcium  8.9 - 10.3 mg/dL 9.1   9.5       Latest Ref Rng & Units 02/15/2024    4:40 AM 02/14/2024   10:20 AM 02/14/2024    3:47 AM  CMP  Glucose 70 - 99 mg/dL 78   892   BUN 8 - 23 mg/dL 51   87   Creatinine 9.55 - 1.00 mg/dL 1.34  85.92  87.13   Sodium 135 - 145 mmol/L 134   138   Potassium 3.5 - 5.1 mmol/L 4.9   7.3   Chloride 98 - 111 mmol/L 96  97   CO2 22 - 32 mmol/L 23   18   Calcium  8.9 - 10.3 mg/dL 9.1   9.5   Total Protein 6.5 - 8.1 g/dL   7.8   Total Bilirubin 0.0 - 1.2 mg/dL   0.9   Alkaline Phos 38 - 126 U/L   62   AST 15 - 41 U/L   15   ALT 0 - 44 U/L   7     Lab Results  Component Value Date   CHOL 104 01/29/2023   HDL 49 01/29/2023   LDLCALC 46 01/29/2023   TRIG 46 01/29/2023   CHOLHDL 2.1 01/29/2023   No results for input(s): LIPOA in the last 8760 hours. No components found for: NTPROBNP No results for input(s): PROBNP in the last 8760 hours. No results for input(s): TSH in the last 8760 hours.  Physical Exam:    Today's Vitals   03/13/24 0922  BP: 130/72  Pulse: 74  SpO2: 96%  Weight: 105 lb (47.6 kg)  Height: 5' 3 (1.6 m)    Body mass index is 18.6 kg/m. Wt Readings from Last 3 Encounters:  03/13/24 105 lb (47.6 kg)  02/16/24 101 lb 6.6 oz (46 kg)  12/01/23 103 lb (46.7 kg)    Physical Exam  Constitutional: She appears  cachectic. No distress.  hemodynamically stable, temporal wasting  Neck: No JVD present.  Cardiovascular: Normal rate, regular rhythm, S1 normal and S2 normal. Exam reveals no gallop, no S3 and no S4.  Murmur heard. Holosystolic murmur is present with a grade of 3/6 radiating to the apex. Pulmonary/Chest: Effort normal and breath sounds normal. No stridor. She has no wheezes. She has no rales.  Dialysis catheter right anterior chest wall.  Sternotomy scar  is well-healed  Musculoskeletal:        General: No edema.     Cervical back: Neck supple.  Skin: Skin is warm.     Impression & Recommendation(s):  Impression:   ICD-10-CM   1. Persistent atrial fibrillation (HCC)  I48.19 Hemoglobin and hematocrit, blood    2. Hypercoagulable state due to paroxysmal atrial fibrillation (HCC)  D68.69 Hemoglobin and hematocrit, blood   I48.0     3. Chronic heart failure with mildly reduced ejection fraction (HFmrEF, 41-49%) (HCC)  I50.22 MR CARDIAC MORPHOLOGY W WO CONTRAST    4. Bilateral carotid artery stenosis  I65.23     5. History of aortic dissection  Z86.79     6. Essential hypertension  I10 AMB Referral to [facility] Pharm-D    7. ESRD on dialysis (HCC)  N18.6    Z99.2     8. LVH (left ventricular hypertrophy)  I51.7 MR CARDIAC MORPHOLOGY W WO CONTRAST      Recommendation(s):  Persistent atrial fibrillation (HCC) Rate control: Carvedilol . Rhythm control: N/A Thromboembolic prophylaxis: Eliquis  2.5 mg p.o. twice daily History of direct-current cardioversion in 10/10/2022. Left atrial size is at least moderately dilated on recent echocardiogram Was being followed by atrial fibrillation clinic-deferred her to general cardiology going forward. Anticoagulation history: Coumadin  Prior antiarrhythmic drugs: N/A Patient does not endorse evidence of bleeding. Risks, benefits, and alternatives to anticoagulation discussed  Chronic heart failure with mildly reduced ejection fraction  (HFmrEF, 41-49%) (HCC) January 2025: LVEF 45% with grade 2 diastolic dysfunction and elevated left atrial pressures. Severe left ventricular hypertrophy with concerns for possible cardiac amyloidosis PYP study negative. During the most recent hospitalization cardiac MRI was recommended as outpatient.  Discussed risks, benefits, and alternatives to  cardiac MRI.  Patient would like to proceed forward. GDMT limited secondary to ESRD. Currently on amlodipine  10 mg p.o. daily. Continue Coreg  25 mg p.o. twice daily. Continue Catapres  0.2 mg patch once a week. Continue hydralazine  100 mg p.o. 3 times daily. Continue Imdur  30 mg on dialysis days and 60 mg on nondialysis days. Office blood pressures are acceptable. Recommend that she gets a blood pressure cuff at home and start checking it regularly.   Will refer her to Pharm.D. clinic for further medication titration. On postdialysis days recommend taking medications based on postdialysis blood pressures.  On nondialysis days if SBP is greater than 130 mmHg patient is advised to take an additional dose of isosorbide  30 mg in addition to her current regimen. During hospitalization patient was recommended to undergo left heart catheterization and possible intervention.  However, since that she has not been experiencing chest pain and her shortness of breath is relatively stable she wants to hold off on additional workup at this time.  Bilateral carotid artery stenosis Known history of bilateral carotid artery stenosis. Most recent carotid duplex from July 2025 notes no significant disease.   Likely consider 1 year follow-up study to confirm the findings, if similar findings no additional follow-up would be needed unless change in clinical status.  History of aortic dissection History of type aortic dissection status postrepair. Had a CT scan for dissection protocol in March 2025. Did follow-up with Dr. Sherrine on 12/01/2023, Note reviewed, felt to be  stable, recommended to follow-up on a as needed basis Will refer her back to cardiothoracic surgery for long-term monitoring of her type a dissection Patient is aware the importance of improving her modifiable cardiovascular risk factors and blood pressure management. Patient states that she has stopped consumption of cocaine .  Essential hypertension Medications as discussed above  ESRD on dialysis [facility]) Followed by nephrology.   Orders Placed:  Orders Placed This Encounter  Procedures   MR CARDIAC MORPHOLOGY W WO CONTRAST    Dialysis patient    Standing Status:   Future    Expected Date:   03/20/2024    Expiration Date:   03/13/2025    Scheduling Instructions:     Dialysis patient    If indicated for the ordered procedure, I authorize the administration of contrast media per Radiology protocol:   Yes    What is the patient's sedation requirement?:   No Sedation    Does the patient have a pacemaker or implanted devices?:   No    Preferred imaging location?:   [facility] (table limit - 500lbs)   Hemoglobin and hematocrit, blood   AMB Referral to [facility] Pharm-D    Referral Priority:   Routine    Referral Type:   Consultation    Referral Reason:   Specialty Services Required    Number of Visits Requested:   1     Final Medication List:   No orders of the defined types were placed in this encounter.   Medications Discontinued During This Encounter  Medication Reason   cloNIDine  (CATAPRES ) 0.1 MG tablet Dose change     Current Outpatient Medications:    acetaminophen  (TYLENOL ) 325 MG tablet, Take 650 mg by mouth every 6 (six) hours as needed for mild pain (pain score 1-3) or moderate pain (pain score 4-6)., Disp: , Rfl:    albuterol  (PROVENTIL ) (2.5 MG/3ML) 0.083% nebulizer solution, Take 3 mLs (2.5 mg total) by nebulization every 6 (six) hours as needed for wheezing or  shortness of breath., Disp: 75 mL, Rfl: 12   albuterol  (VENTOLIN  HFA) 108 (90 Base) MCG/ACT  inhaler, Inhale 2 puffs into the lungs every 6 (six) hours as needed., Disp: , Rfl:    amLODipine  (NORVASC ) 10 MG tablet, Take 1 tablet (10 mg total) by mouth daily., Disp: 30 tablet, Rfl: 0   apixaban  (ELIQUIS ) 2.5 MG TABS tablet, Take 1 tablet (2.5 mg total) by mouth 2 (two) times daily., Disp: 60 tablet, Rfl: 0   carvedilol  (COREG ) 25 MG tablet, Take 1 tablet (25 mg total) by mouth 2 (two) times daily with a meal., Disp: 60 tablet, Rfl: 1   Cholecalciferol  (VITAMIN D -3) 125 MCG (5000 UT) TABS, Take 1 tablet by mouth in the morning., Disp: , Rfl:    cinacalcet  (SENSIPAR ) 60 MG tablet, Take 60 mg by mouth every dialysis (Tuesday, Thursday, and Saturday)., Disp: , Rfl:    cloNIDine  (CATAPRES  - DOSED IN MG/24 HR) 0.2 mg/24hr patch, Place 0.2 mg onto the skin once a week., Disp: , Rfl:    hydrALAZINE  (APRESOLINE ) 100 MG tablet, Take 1 tablet (100 mg total) by mouth every 8 (eight) hours., Disp: 270 tablet, Rfl: 3   isosorbide  mononitrate (IMDUR ) 30 MG 24 hr tablet, TAKE 1 TABLET (30MG ) ON DIALYSIS DAYS AND 2 TABLETS (60 MG) ON NON DIALYSIS DAYS., Disp: 135 tablet, Rfl: 3   LOKELMA  10 g PACK packet, Take 10 g by mouth every Monday, Wednesday, and Friday., Disp: , Rfl:    naloxone  (NARCAN ) nasal spray 4 mg/0.1 mL, Place 1 spray into the nose as needed (opioid overdose)., Disp: , Rfl:    oxyCODONE -acetaminophen  (PERCOCET) 10-325 MG tablet, Take 1 tablet by mouth in the morning, at noon, and at bedtime., Disp: , Rfl:    pantoprazole  (PROTONIX ) 40 MG tablet, TAKE 1 TABLET (40 MG TOTAL) BY MOUTH 2 (TWO) TIMES DAILY., Disp: 60 tablet, Rfl: 1   rosuvastatin  (CRESTOR ) 10 MG tablet, TAKE 1 TABLET (10 MG TOTAL) BY MOUTH DAILY FOR CHOLESTEROL, Disp: 90 tablet, Rfl: 0   sevelamer  carbonate (RENVELA ) 2.4 g PACK, Take 2.4 g by mouth daily., Disp: , Rfl:    traZODone  (DESYREL ) 50 MG tablet, Take 25-50 mg by mouth at bedtime as needed for sleep., Disp: , Rfl:   Consent:   NA  Disposition:   87-month  follow-up  Her questions and concerns were addressed to her satisfaction. She voices understanding of the recommendations provided during this encounter.   Signed, Madonna Michele HAS, [facility]  A Division of [facility] [address] [place], [place] [address]  [place], [place] [address] 03/13/2024 11:02 AM

## 2024-03-14 ENCOUNTER — Ambulatory Visit: Payer: Self-pay | Admitting: Cardiology

## 2024-03-14 LAB — HEMOGLOBIN AND HEMATOCRIT, BLOOD
Hematocrit: 30.3 % — ABNORMAL LOW (ref 34.0–46.6)
Hemoglobin: 9.4 g/dL — ABNORMAL LOW (ref 11.1–15.9)

## 2024-03-21 NOTE — Telephone Encounter (Signed)
 Patient's sister is following up. She said dialysis can be contacted at [phone] to review results.

## 2024-03-29 ENCOUNTER — Other Ambulatory Visit (HOSPITAL_COMMUNITY): Payer: Self-pay

## 2024-03-29 ENCOUNTER — Ambulatory Visit: Attending: Cardiology

## 2024-03-29 VITALS — BP 132/82 | HR 68

## 2024-03-29 DIAGNOSIS — I10 Essential (primary) hypertension: Secondary | ICD-10-CM

## 2024-03-29 MED ORDER — OMRON 3 SERIES BP MONITOR DEVI
1.0000 | Freq: Every day | 0 refills | Status: DC
  Filled 2024-03-29: qty 1, 30d supply, fill #0

## 2024-03-29 NOTE — Progress Notes (Signed)
 Patient ID: Monique Henderson                 DOB: 12/09/1950                      MRN: [id number]      HPI: ALANE HANSSEN is a 73 y.o. female referred by Dr. Michele to HTN clinic. PMH is significant for HTN, HLD, atrial fibrillation, HFmrEF, aorthic atherosclerosis, hx of CVA (05/2019) , ESRD on HD (TuesThursSat), COPD, hx of cocaine  use and tobacco use disorder.  Patient presents to PharmD HTN clinic accompanied by her sister. She is in good spirits and reports feeling generally well. She monitors her blood pressure at home using a wrist device. Due to limited mobility on her right side following a stroke, she finds it challenging to use an upper arm cuff. However, her son visits daily and would be able to assist her with using an arm monitor.  She did not bring a BP log but recalls her typical home readings being in the 160s/90s range. She does not currently monitor her heart rate. Confirmed that the patient is using proper technique when measuring blood pressure at home.   The patient reports no issues with her current medications. She noted that she takes an additional isosorbide  mononitrate tablet on non-dialysis days, as directed by her cardiologist.  She denies experiencing shortness of breath, chest pain, or headache.  Patient's sister has organized her medications in a pill box but expresses concern that some doses may be missed. The patient confirms that she occasionally forgets to take her medications or sleeps through scheduled doses even with phone alarm.  Current HTN meds: amlodipine  10 mg daily, carvedilol  25 mg twice daily, clonidine  patch 0.2 mg once every 7 days, hydralazine  100 mg q8h, isosorbide  mononitrate 30 mg daily, with an extra tablet on non dialysis days Previously tried: lisinopril  (cough) BP goal: < 140/90 (will target less stringent goal due to ESRD on HD)  Labs (01/2024): Scr 8.65, BUN 51, K 4.9  Family History:  Relation Problem Comments  Mother (Deceased  at age 83) Colon cancer (Age: 50)   Hypertension     Father (Deceased)   Sister (Deceased) Diabetes (Age: 37)     Sister Metallurgist) Hypertension     Sister Metallurgist)   Sister Metallurgist)   Sister Metallurgist)   Sister Metallurgist)   Sister Metallurgist)   Sister Metallurgist)   Sister Metallurgist)   Sister Metallurgist)   Sister Bone cancer (Age: 21)     Brother (Armed Forces Training And Education Officer) Diabetes   Hypertension     Brother Metallurgist) Diabetes   Hypertension     Brother (Alive)   Maternal Grandmother (Deceased)   Maternal Grandfather (Deceased)   Paternal Grandmother (Deceased)   Paternal Grandfather (Deceased)   Son Metallurgist) Diabetes   Hypertension   Kidney disease s/p renal transplant    Son Metallurgist) Multiple sclerosis     Neg Hx Breast cancer   Cervical cancer       Social History:  Alcohol : none Smoking: not currently. Patient is a former smoker; 11 pack year hx  Diet: Patient reports a generally low appetite. Her typical breakfast includes a fried egg, sausage, hashbrown, and milk. For lunch and dinner, she primarily consumes Ensure nutritional shakes. She snacks on fruits, such as oranges. Her fluid intake is restricted to approximately 60 oz daily, consisting mainly of juice and milk.  Exercise:  Patient reports engaging in physical activity by walking  around her apartment complex, including going up and down stairs.  Home BP readings: No blood pressure log provided today but recalls her typical home readings being in the 160s/90s range.   Wt Readings from Last 3 Encounters:  03/13/24 105 lb (47.6 kg)  02/16/24 101 lb 6.6 oz (46 kg)  12/01/23 103 lb (46.7 kg)   BP Readings from Last 3 Encounters:  03/29/24 132/82  03/13/24 130/72  02/16/24 (!) 199/92   Pulse Readings from Last 3 Encounters:  03/29/24 68  03/13/24 74  02/16/24 73    Renal function: CrCl cannot be calculated (Patient's most recent lab result is older than the maximum 21 days allowed.).  Past Medical History:  Diagnosis Date   Acute  cardioembolic stroke (HCC) 02/26/2022   AF (paroxysmal atrial fibrillation) (HCC) 05/29/2019   on Coumadin    Aortic atherosclerosis 07/05/2019   Aortic dissection (HCC) 04/04/2019   s/p repair   Bone spur 2008   Right calcaneal foot spur   Breast cancer (HCC) 2004   Ductal carcinoma in situ of the left breast; S/P left partial mastectomy 02/26/2003; S/P re-excision of cranial and lateral margins11/18/2004.radiation   Carotid artery disease    Cerebral thrombosis with cerebral infarction 05/22/2019   Chronic HFrEF (heart failure with reduced ejection fraction) (HCC)    Chronic low back pain 06/22/2016   Chronic obstructive lung disease (HCC) 01/16/2017   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 Deward Null; S/P re-excision of superior and inferior margins 10/27/2012.    Dissection of aorta (HCC) 04/03/2019   ESRD on hemodialysis (HCC) 05/29/2019   Essential hypertension 09/16/2006   GERD 09/16/2006   Hepatitis C    treated and RNA confirmed not detectable 01/2017   Insomnia 03/14/2015   Malnutrition of moderate degree 05/19/2019   Non compliance with medical treatment 12/04/2017   Normocytic anemia    With thrombocytosis   Osteoarthritis    Right ureteral stone 2002   S/P lumbar spinal fusion 01/18/2014   S/P lumbar decompressive laminectomy, fusion, and plating for lumbar spinal stenosis on 05/27/2009 by Dr. Alm CANDIE Molt.  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 Alm CANDIE Molt.     Stroke (cerebrum) (HCC)    Stroke (HCC) 02/23/2022   SVT (supraventricular tachycardia) 08/28/2019   Tobacco use disorder 04/19/2009   Uterine fibroid    Wears dentures    top    Current Outpatient Medications on File Prior to Visit  Medication Sig  Dispense Refill   acetaminophen  (TYLENOL ) 325 MG tablet Take 650 mg by mouth every 6 (six) hours as needed for mild pain (pain score 1-3) or moderate pain (pain score 4-6).     albuterol  (PROVENTIL ) (2.5 MG/3ML) 0.083% nebulizer solution Take 3 mLs (2.5 mg total) by nebulization every 6 (six) hours as needed for wheezing or shortness of breath. 75 mL 12   albuterol  (VENTOLIN  HFA) 108 (90 Base) MCG/ACT inhaler Inhale 2 puffs into the lungs every 6 (six) hours as needed.     amLODipine  (NORVASC ) 10 MG tablet Take 1 tablet (10 mg total) by mouth daily. 30 tablet 0   apixaban  (ELIQUIS ) 2.5 MG TABS tablet Take 1 tablet (2.5 mg total) by mouth 2 (two) times daily. 60 tablet 0   carvedilol  (COREG ) 25 MG tablet  Take 1 tablet (25 mg total) by mouth 2 (two) times daily with a meal. 60 tablet 1   Cholecalciferol  (VITAMIN D -3) 125 MCG (5000 UT) TABS Take 1 tablet by mouth in the morning.     cinacalcet  (SENSIPAR ) 60 MG tablet Take 60 mg by mouth every dialysis (Tuesday, Thursday, and Saturday).     cloNIDine  (CATAPRES  - DOSED IN MG/24 HR) 0.2 mg/24hr patch Place 0.2 mg onto the skin once a week.     hydrALAZINE  (APRESOLINE ) 100 MG tablet Take 1 tablet (100 mg total) by mouth every 8 (eight) hours. 270 tablet 3   isosorbide  mononitrate (IMDUR ) 30 MG 24 hr tablet TAKE 1 TABLET (30MG ) ON DIALYSIS DAYS AND 2 TABLETS (60 MG) ON NON DIALYSIS DAYS. 135 tablet 3   LOKELMA  10 g PACK packet Take 10 g by mouth every Monday, Wednesday, and Friday.     naloxone  (NARCAN ) nasal spray 4 mg/0.1 mL Place 1 spray into the nose as needed (opioid overdose).     oxyCODONE -acetaminophen  (PERCOCET) 10-325 MG tablet Take 1 tablet by mouth in the morning, at noon, and at bedtime.     pantoprazole  (PROTONIX ) 40 MG tablet TAKE 1 TABLET (40 MG TOTAL) BY MOUTH 2 (TWO) TIMES DAILY. 60 tablet 1   rosuvastatin  (CRESTOR ) 10 MG tablet TAKE 1 TABLET (10 MG TOTAL) BY MOUTH DAILY FOR CHOLESTEROL 90 tablet 0   sevelamer  carbonate (RENVELA ) 2.4 g  PACK Take 2.4 g by mouth daily.     traZODone  (DESYREL ) 50 MG tablet Take 25-50 mg by mouth at bedtime as needed for sleep.     No current facility-administered medications on file prior to visit.    Allergies  Allergen Reactions   Shrimp [Shellfish Allergy] Shortness Of Breath   Bactroban  [Mupirocin ] Other (See Comments)    Sores in nose   Hydrocodone  Itching, Nausea And Vomiting and Nausea Only   Chlorhexidine  Gluconate Itching   Latex Itching    Latex gloves cause itchiness and a rash   Tylenol  [Acetaminophen ] Itching    Takes Percocet at home 05/06/23   Zestril  [Lisinopril ] Cough    Blood pressure 132/82, pulse 68.   Assessment/Plan:  1. Hypertension -  Hypertension Assessment: In-office BP 125/82 mmHg (automatic) and 132/82 mmHg (manual), both within goal of <140/90 mmHg; HR 65 and 68 bpm Tolerates current well without any side effects Denies SOB, palpitation, chest pain, headaches,or swelling Reiterated the importance of medication adherence so appropriate titrations can be made at follow visits and reduce CV risk; Discussed strategies to remember to take medications Encouraged a low salt diet and regular physical activity as tolerated Discussed reliability of upper arm blood pressure monitors compared to wrist monitors - patient will have son help her with setting up Plan:  Continue taking amlodipine  10 mg daily, carvedilol  25 mg twice daily, clonidine  patch 0.2 mg once every 7 days, hydralazine  100 mg q8h, isosorbide  mononitrate 30 mg daily, with an extra tablet on non dialysis days Prescription for arm blood pressure monitor sent to pharmacy downstairs Patient to keep record of BP readings with heart rate and report to us  at the next visit; She will also bring cuff for validation Patient to see PharmD in 4-6 weeks for follow up    Thank you  Romano Stigger E. Kirrah Mustin, Pharm.D [place] [facility] BIRCH. [facility] [address],  [place], [place] [address] Phone: [phone]; Fax: [phone]

## 2024-03-29 NOTE — Patient Instructions (Signed)
 Changes made by your pharmacist German Manke E. Aaylah Pokorny, PharmD at today's visit:    Instructions/Changes  (what do you need to do) Your Notes  (what you did and when you did it)  Pick up blood pressure cuff downstairs   Continue amlodipine  10 mg daily, carvedilol  25 mg twice daily, clonidine  patch 0.2 mg once every 7 days, hydralazine  100 mg q8h, isosorbide  mononitrate 30 mg daily, with an extra tablet on non dialysis days   2. PharmD f/u appointment in 4-6 weeks (will call patient's sister to schedule)    Bring all of your meds, your BP cuff and your record of home blood pressures to your next appointment.   Monique Henderson, Pharm.D [place] [facility] BIRCH. [facility] [address], [place], [place] [address] Phone: [phone]; Fax: [phone]     HOW TO TAKE YOUR BLOOD PRESSURE AT HOME  Rest 5 minutes before taking your blood pressure.  Don't smoke or drink caffeinated beverages for at least 30 minutes before. Take your blood pressure before (not after) you eat. Sit comfortably with your back supported and both feet on the floor (don't cross your legs). Elevate your arm to heart level on a table or a desk. Use the proper sized cuff. It should fit smoothly and snugly around your bare upper arm. There should be enough room to slip a fingertip under the cuff. The bottom edge of the cuff should be 1 inch above the crease of the elbow. Ideally, take 3 measurements at one sitting and record the average.  Important lifestyle changes to control high blood pressure  Intervention  Effect on the BP  Lose extra pounds and watch your waistline Weight loss is one of the most effective lifestyle changes for controlling blood pressure. If you're overweight or obese, losing even a small amount of weight can help reduce blood pressure. Blood pressure might go down by about 1 millimeter of mercury (mm Hg) with each kilogram (about 2.2 pounds) of weight lost.  Exercise  regularly As a general goal, aim for at least 30 minutes of moderate physical activity every day. Regular physical activity can lower high blood pressure by about 5 to 8 mm Hg.  Eat a healthy diet Eating a diet rich in whole grains, fruits, vegetables, and low-fat dairy products and low in saturated fat and cholesterol. A healthy diet can lower high blood pressure by up to 11 mm Hg.  Reduce salt (sodium) in your diet Even a small reduction of sodium in the diet can improve heart health and reduce high blood pressure by about 5 to 6 mm Hg.  Limit alcohol  One drink equals 12 ounces of beer, 5 ounces of wine, or 1.5 ounces of 80-proof liquor.  Limiting alcohol  to less than one drink a day for women or two drinks a day for men can help lower blood pressure by about 4 mm Hg.   If you have any questions or concerns please use My Chart to send questions or call the office at [phone]

## 2024-03-29 NOTE — Assessment & Plan Note (Addendum)
 Assessment: In-office BP 125/82 mmHg (automatic) and 132/82 mmHg (manual), both within goal of <140/90 mmHg; HR 65 and 68 bpm Tolerates current well without any side effects Denies SOB, palpitation, chest pain, headaches,or swelling Reiterated the importance of medication adherence so appropriate titrations can be made at follow visits and reduce CV risk; Discussed strategies to remember to take medications Encouraged a low salt diet and regular physical activity as tolerated Discussed reliability of upper arm blood pressure monitors compared to wrist monitors - patient will have son help her with setting up Plan:  Continue taking amlodipine  10 mg daily, carvedilol  25 mg twice daily, clonidine  patch 0.2 mg once every 7 days, hydralazine  100 mg q8h, isosorbide  mononitrate 30 mg daily, with an extra tablet on non dialysis days Prescription for arm blood pressure monitor sent to pharmacy downstairs Patient to keep record of BP readings with heart rate and report to us  at the next visit; She will also bring cuff for validation Patient to see PharmD in 4-6 weeks for follow up

## 2024-04-06 ENCOUNTER — Telehealth: Payer: Self-pay

## 2024-04-06 NOTE — Telephone Encounter (Signed)
 PharmD HTN appointment scheduled 05/12/2024.

## 2024-04-10 ENCOUNTER — Encounter (HOSPITAL_COMMUNITY): Payer: Self-pay

## 2024-04-10 ENCOUNTER — Telehealth (HOSPITAL_COMMUNITY): Payer: Self-pay | Admitting: *Deleted

## 2024-04-10 NOTE — Telephone Encounter (Signed)
 Called Dr. Lamar Fret after his request in a secure chat to discuss about patient's upcoming cardiac MRI.  He is ok with us  give gadavist for the study so long as she gets to dialysis within 12-24 hours after injection. Dr. Fret mentioned that patient is scheduled for dialysis the afternoon after her MRI.  Chantal Requena RN Navigator Cardiac Imaging [facility] [phone] Office [phone] Cell

## 2024-04-11 ENCOUNTER — Other Ambulatory Visit: Payer: Self-pay | Admitting: Cardiology

## 2024-04-11 ENCOUNTER — Ambulatory Visit (HOSPITAL_COMMUNITY)
Admission: RE | Admit: 2024-04-11 | Discharge: 2024-04-11 | Disposition: A | Discharge status: Home / Self Care | Source: Ambulatory Visit | Attending: Cardiology | Admitting: Cardiology

## 2024-04-11 DIAGNOSIS — I517 Cardiomegaly: Secondary | ICD-10-CM

## 2024-04-11 DIAGNOSIS — I5022 Chronic systolic (congestive) heart failure: Secondary | ICD-10-CM

## 2024-04-11 MED ORDER — GADOBUTROL 1 MMOL/ML IV SOLN
7.0000 mL | Freq: Once | INTRAVENOUS | Status: AC | PRN
  Administered 2024-04-11: 7 mL via INTRAVENOUS

## 2024-05-12 ENCOUNTER — Ambulatory Visit

## 2024-05-18 ENCOUNTER — Other Ambulatory Visit: Payer: Self-pay

## 2024-05-18 ENCOUNTER — Observation Stay (HOSPITAL_COMMUNITY)
Admission: EM | Admit: 2024-05-18 | Discharge: 2024-05-19 | Disposition: A | Attending: Internal Medicine | Admitting: Internal Medicine

## 2024-05-18 ENCOUNTER — Encounter (HOSPITAL_COMMUNITY): Payer: Self-pay

## 2024-05-18 ENCOUNTER — Emergency Department (HOSPITAL_COMMUNITY)

## 2024-05-18 DIAGNOSIS — Z91040 Latex allergy status: Secondary | ICD-10-CM | POA: Diagnosis not present

## 2024-05-18 DIAGNOSIS — Z7901 Long term (current) use of anticoagulants: Secondary | ICD-10-CM | POA: Diagnosis not present

## 2024-05-18 DIAGNOSIS — I160 Hypertensive urgency: Secondary | ICD-10-CM | POA: Diagnosis not present

## 2024-05-18 DIAGNOSIS — I214 Non-ST elevation (NSTEMI) myocardial infarction: Secondary | ICD-10-CM | POA: Insufficient documentation

## 2024-05-18 DIAGNOSIS — Z992 Dependence on renal dialysis: Secondary | ICD-10-CM | POA: Diagnosis not present

## 2024-05-18 DIAGNOSIS — I132 Hypertensive heart and chronic kidney disease with heart failure and with stage 5 chronic kidney disease, or end stage renal disease: Secondary | ICD-10-CM | POA: Insufficient documentation

## 2024-05-18 DIAGNOSIS — I480 Paroxysmal atrial fibrillation: Secondary | ICD-10-CM | POA: Diagnosis not present

## 2024-05-18 DIAGNOSIS — J449 Chronic obstructive pulmonary disease, unspecified: Secondary | ICD-10-CM | POA: Insufficient documentation

## 2024-05-18 DIAGNOSIS — E785 Hyperlipidemia, unspecified: Secondary | ICD-10-CM | POA: Insufficient documentation

## 2024-05-18 DIAGNOSIS — Z79899 Other long term (current) drug therapy: Secondary | ICD-10-CM | POA: Diagnosis not present

## 2024-05-18 DIAGNOSIS — R0600 Dyspnea, unspecified: Secondary | ICD-10-CM | POA: Diagnosis not present

## 2024-05-18 DIAGNOSIS — D631 Anemia in chronic kidney disease: Secondary | ICD-10-CM | POA: Insufficient documentation

## 2024-05-18 DIAGNOSIS — N186 End stage renal disease: Secondary | ICD-10-CM | POA: Diagnosis present

## 2024-05-18 DIAGNOSIS — I5022 Chronic systolic (congestive) heart failure: Secondary | ICD-10-CM | POA: Insufficient documentation

## 2024-05-18 DIAGNOSIS — R7989 Other specified abnormal findings of blood chemistry: Secondary | ICD-10-CM | POA: Diagnosis not present

## 2024-05-18 DIAGNOSIS — R0603 Acute respiratory distress: Secondary | ICD-10-CM | POA: Diagnosis present

## 2024-05-18 DIAGNOSIS — J810 Acute pulmonary edema: Principal | ICD-10-CM

## 2024-05-18 LAB — CBC WITH DIFFERENTIAL/PLATELET
Abs Immature Granulocytes: 0.03 K/uL (ref 0.00–0.07)
Basophils Absolute: 0.1 K/uL (ref 0.0–0.1)
Basophils Relative: 1 %
Eosinophils Absolute: 0.2 K/uL (ref 0.0–0.5)
Eosinophils Relative: 2 %
HCT: 38.5 % (ref 36.0–46.0)
Hemoglobin: 11.8 g/dL — ABNORMAL LOW (ref 12.0–15.0)
Immature Granulocytes: 0 %
Lymphocytes Relative: 6 %
Lymphs Abs: 0.5 K/uL — ABNORMAL LOW (ref 0.7–4.0)
MCH: 29.2 pg (ref 26.0–34.0)
MCHC: 30.6 g/dL (ref 30.0–36.0)
MCV: 95.3 fL (ref 80.0–100.0)
Monocytes Absolute: 0.7 K/uL (ref 0.1–1.0)
Monocytes Relative: 9 %
Neutro Abs: 6.1 K/uL (ref 1.7–7.7)
Neutrophils Relative %: 82 %
Platelets: 245 K/uL (ref 150–400)
RBC: 4.04 MIL/uL (ref 3.87–5.11)
RDW: 17 % — ABNORMAL HIGH (ref 11.5–15.5)
WBC: 7.5 K/uL (ref 4.0–10.5)
nRBC: 0 % (ref 0.0–0.2)

## 2024-05-18 LAB — CBC
HCT: 33.4 % — ABNORMAL LOW (ref 36.0–46.0)
Hemoglobin: 10.5 g/dL — ABNORMAL LOW (ref 12.0–15.0)
MCH: 29.7 pg (ref 26.0–34.0)
MCHC: 31.4 g/dL (ref 30.0–36.0)
MCV: 94.4 fL (ref 80.0–100.0)
Platelets: 211 K/uL (ref 150–400)
RBC: 3.54 MIL/uL — ABNORMAL LOW (ref 3.87–5.11)
RDW: 16.9 % — ABNORMAL HIGH (ref 11.5–15.5)
WBC: 6.6 K/uL (ref 4.0–10.5)
nRBC: 0 % (ref 0.0–0.2)

## 2024-05-18 LAB — COMPREHENSIVE METABOLIC PANEL WITH GFR
ALT: <5 U/L (ref 0–44)
AST: 21 U/L (ref 15–41)
Albumin: 4.7 g/dL (ref 3.5–5.0)
Alkaline Phosphatase: 88 U/L (ref 38–126)
Anion gap: 18 — ABNORMAL HIGH (ref 5–15)
BUN: 49 mg/dL — ABNORMAL HIGH (ref 8–23)
CO2: 24 mmol/L (ref 22–32)
Calcium: 9.7 mg/dL (ref 8.9–10.3)
Chloride: 99 mmol/L (ref 98–111)
Creatinine, Ser: 9.11 mg/dL — ABNORMAL HIGH (ref 0.44–1.00)
GFR, Estimated: 4 mL/min — ABNORMAL LOW (ref >60)
Glucose, Bld: 110 mg/dL — ABNORMAL HIGH (ref 70–99)
Potassium: 5.1 mmol/L (ref 3.5–5.1)
Sodium: 140 mmol/L (ref 135–145)
Total Bilirubin: 0.5 mg/dL (ref 0.0–1.2)
Total Protein: 8 g/dL (ref 6.5–8.1)

## 2024-05-18 LAB — I-STAT VENOUS BLOOD GAS, ED
Acid-Base Excess: 2 mmol/L (ref 0.0–2.0)
Bicarbonate: 26.4 mmol/L (ref 20.0–28.0)
Calcium, Ion: 0.99 mmol/L — ABNORMAL LOW (ref 1.15–1.40)
HCT: 39 % (ref 36.0–46.0)
Hemoglobin: 13.3 g/dL (ref 12.0–15.0)
O2 Saturation: 99 %
Potassium: 5 mmol/L (ref 3.5–5.1)
Sodium: 139 mmol/L (ref 135–145)
TCO2: 28 mmol/L (ref 22–32)
pCO2, Ven: 39.2 mmHg — ABNORMAL LOW (ref 44–60)
pH, Ven: 7.437 — ABNORMAL HIGH (ref 7.25–7.43)
pO2, Ven: 141 mmHg — ABNORMAL HIGH (ref 32–45)

## 2024-05-18 LAB — CREATININE, SERUM
Creatinine, Ser: 9.34 mg/dL — ABNORMAL HIGH (ref 0.44–1.00)
GFR, Estimated: 4 mL/min — ABNORMAL LOW (ref >60)

## 2024-05-18 LAB — HEPATITIS B SURFACE ANTIGEN: Hepatitis B Surface Ag: NONREACTIVE

## 2024-05-18 LAB — RESP PANEL BY RT-PCR (RSV, FLU A&B, COVID)  RVPGX2
Influenza A by PCR: NEGATIVE
Influenza B by PCR: NEGATIVE
Resp Syncytial Virus by PCR: NEGATIVE
SARS Coronavirus 2 by RT PCR: NEGATIVE

## 2024-05-18 LAB — TROPONIN T, HIGH SENSITIVITY
Troponin T High Sensitivity: 107 ng/L (ref 0–19)
Troponin T High Sensitivity: 107 ng/L (ref 0–19)

## 2024-05-18 LAB — MAGNESIUM: Magnesium: 2.2 mg/dL (ref 1.7–2.4)

## 2024-05-18 MED ORDER — HYDRALAZINE HCL 50 MG PO TABS
100.0000 mg | ORAL_TABLET | Freq: Three times a day (TID) | ORAL | Status: DC
  Administered 2024-05-18 – 2024-05-19 (×2): 100 mg via ORAL
  Filled 2024-05-18 (×7): qty 2

## 2024-05-18 MED ORDER — HEPARIN SODIUM (PORCINE) 1000 UNIT/ML DIALYSIS
1000.0000 [IU] | INTRAMUSCULAR | Status: DC | PRN
  Administered 2024-05-18: 18:00:00 3800 [IU]
  Filled 2024-05-18: qty 1

## 2024-05-18 MED ORDER — FENTANYL CITRATE (PF) 50 MCG/ML IJ SOSY
50.0000 ug | PREFILLED_SYRINGE | Freq: Once | INTRAMUSCULAR | Status: AC
  Administered 2024-05-18: 10:00:00 50 ug via INTRAVENOUS
  Filled 2024-05-18: qty 1

## 2024-05-18 MED ORDER — HYDRALAZINE HCL 20 MG/ML IJ SOLN
10.0000 mg | INTRAMUSCULAR | Status: DC | PRN
  Filled 2024-05-18: qty 1

## 2024-05-18 MED ORDER — LIDOCAINE HCL (PF) 1 % IJ SOLN: 5.0000 mL | INTRAMUSCULAR | Status: DC | PRN

## 2024-05-18 MED ORDER — AMLODIPINE BESYLATE 5 MG PO TABS
10.0000 mg | ORAL_TABLET | Freq: Every day | ORAL | Status: DC
  Administered 2024-05-19: 10 mg via ORAL
  Filled 2024-05-18: qty 2

## 2024-05-18 MED ORDER — OXYCODONE-ACETAMINOPHEN 5-325 MG PO TABS
1.0000 | ORAL_TABLET | Freq: Three times a day (TID) | ORAL | Status: DC | PRN
  Administered 2024-05-18: 22:00:00 1 via ORAL
  Filled 2024-05-18 (×2): qty 1

## 2024-05-18 MED ORDER — SEVELAMER CARBONATE 2.4 G PO PACK
2.4000 g | PACK | Freq: Every day | ORAL | Status: DC
  Administered 2024-05-19: 2.4 g via ORAL
  Filled 2024-05-18: qty 1

## 2024-05-18 MED ORDER — LABETALOL HCL 5 MG/ML IV SOLN
10.0000 mg | INTRAVENOUS | Status: DC | PRN
  Administered 2024-05-18 – 2024-05-19 (×2): 10 mg via INTRAVENOUS
  Filled 2024-05-18 (×2): qty 4

## 2024-05-18 MED ORDER — ANTICOAGULANT SODIUM CITRATE 4% (200MG/5ML) IV SOLN: 5.0000 mL | Status: DC | PRN

## 2024-05-18 MED ORDER — HYDRALAZINE HCL 20 MG/ML IJ SOLN
10.0000 mg | Freq: Once | INTRAMUSCULAR | Status: AC
  Administered 2024-05-18: 12:00:00 10 mg via INTRAVENOUS
  Filled 2024-05-18: qty 1

## 2024-05-18 MED ORDER — SODIUM ZIRCONIUM CYCLOSILICATE 10 G PO PACK
10.0000 g | PACK | ORAL | Status: DC
  Administered 2024-05-19: 10 g via ORAL
  Filled 2024-05-18: qty 1

## 2024-05-18 MED ORDER — IPRATROPIUM-ALBUTEROL 0.5-2.5 (3) MG/3ML IN SOLN: 3.0000 mL | Freq: Four times a day (QID) | RESPIRATORY_TRACT | Status: DC | PRN

## 2024-05-18 MED ORDER — PANTOPRAZOLE SODIUM 40 MG PO TBEC
40.0000 mg | DELAYED_RELEASE_TABLET | Freq: Two times a day (BID) | ORAL | Status: DC
  Administered 2024-05-18 – 2024-05-19 (×2): 40 mg via ORAL
  Filled 2024-05-18 (×2): qty 1

## 2024-05-18 MED ORDER — NEPRO/CARBSTEADY PO LIQD: 237.0000 mL | ORAL | Status: DC | PRN

## 2024-05-18 MED ORDER — HEPARIN SODIUM (PORCINE) 1000 UNIT/ML IJ SOLN
INTRAMUSCULAR | Status: AC
  Filled 2024-05-18: qty 4

## 2024-05-18 MED ORDER — NITROGLYCERIN 2 % TD OINT
1.0000 [in_us] | TOPICAL_OINTMENT | Freq: Three times a day (TID) | TRANSDERMAL | Status: DC
  Administered 2024-05-19: 1 [in_us] via TOPICAL
  Filled 2024-05-18: qty 30

## 2024-05-18 MED ORDER — CARVEDILOL 12.5 MG PO TABS
25.0000 mg | ORAL_TABLET | Freq: Two times a day (BID) | ORAL | Status: DC
  Administered 2024-05-18 – 2024-05-19 (×2): 25 mg via ORAL
  Filled 2024-05-18 (×2): qty 2

## 2024-05-18 MED ORDER — PENTAFLUOROPROP-TETRAFLUOROETH EX AERO: 1.0000 | INHALATION_SPRAY | CUTANEOUS | Status: DC | PRN

## 2024-05-18 MED ORDER — ROSUVASTATIN CALCIUM 5 MG PO TABS
10.0000 mg | ORAL_TABLET | Freq: Every day | ORAL | Status: DC
  Administered 2024-05-19: 10 mg via ORAL
  Filled 2024-05-18: qty 2

## 2024-05-18 MED ORDER — NITROGLYCERIN IN D5W 200-5 MCG/ML-% IV SOLN
0.0000 ug/min | INTRAVENOUS | Status: DC
  Administered 2024-05-18: 07:00:00 5 ug/min via INTRAVENOUS
  Filled 2024-05-18: qty 250

## 2024-05-18 MED ORDER — HEPARIN SODIUM (PORCINE) 5000 UNIT/ML IJ SOLN: 5000.0000 [IU] | Freq: Three times a day (TID) | INTRAMUSCULAR | Status: DC

## 2024-05-18 MED ORDER — ALTEPLASE 2 MG IJ SOLR: 2.0000 mg | Freq: Once | INTRAMUSCULAR | Status: DC | PRN

## 2024-05-18 MED ORDER — APIXABAN 2.5 MG PO TABS
2.5000 mg | ORAL_TABLET | Freq: Two times a day (BID) | ORAL | Status: DC
  Administered 2024-05-18 – 2024-05-19 (×2): 2.5 mg via ORAL
  Filled 2024-05-18 (×2): qty 1

## 2024-05-18 MED ORDER — OXYCODONE HCL 5 MG PO TABS
5.0000 mg | ORAL_TABLET | Freq: Three times a day (TID) | ORAL | Status: DC | PRN
  Administered 2024-05-18: 22:00:00 5 mg via ORAL
  Filled 2024-05-18 (×2): qty 1

## 2024-05-18 MED ORDER — TRAZODONE HCL 50 MG PO TABS: 25.0000 mg | ORAL_TABLET | Freq: Every evening | ORAL | Status: DC | PRN

## 2024-05-18 MED ORDER — CINACALCET HCL 30 MG PO TABS: 60.0000 mg | ORAL_TABLET | ORAL | Status: DC

## 2024-05-18 MED ORDER — LIDOCAINE-PRILOCAINE 2.5-2.5 % EX CREA: 1.0000 | TOPICAL_CREAM | CUTANEOUS | Status: DC | PRN

## 2024-05-18 NOTE — Progress Notes (Signed)
°   05/18/24 1744  Vitals  Temp 97.7 F (36.5 C)  Pulse Rate (!) 130  Resp (!) 32  BP (!) 203/141  SpO2 97 %  O2 Device Nasal Cannula  Oxygen  Therapy  O2 Flow Rate (L/min) 4 L/min  Post Treatment  Dialyzer Clearance Clear  Liters Processed 78.4  Fluid Removed (mL) 300 mL  Tolerated HD Treatment Yes

## 2024-05-18 NOTE — ED Provider Notes (Addendum)
 [place] EMERGENCY DEPARTMENT AT [facility] Provider Note   CSN: [id number] Arrival date & time: 05/18/24  9342     Patient presents with: Respiratory Distress   Monique Henderson is a 73 y.o. female.   HPI Patient presents with shortness of breath.  History of same.  Is a dialysis patient on Tuesday Thursday Saturday.  Today is Thursday and apparently did miss dialysis on Tuesday.  Sats in the 80s for EMS.  Started on CPAP by EMS after sats around 88% on nonrebreather.  Had been given sublingual nitro by EMS.  Blood pressure for EMS of 220/140.      Prior to Admission medications  Medication Sig Start Date End Date Taking? Authorizing Provider  acetaminophen  (TYLENOL ) 325 MG tablet Take 650 mg by mouth every 6 (six) hours as needed for mild pain (pain score 1-3) or moderate pain (pain score 4-6).    [provider]  albuterol  (PROVENTIL ) (2.5 MG/3ML) 0.083% nebulizer solution Take 3 mLs (2.5 mg total) by nebulization every 6 (six) hours as needed for wheezing or shortness of breath. 08/23/23   Pokhrel, Vernal, MD  albuterol  (VENTOLIN  HFA) 108 (90 Base) MCG/ACT inhaler Inhale 2 puffs into the lungs every 6 (six) hours as needed. 10/02/23   [provider]  amLODipine  (NORVASC ) 10 MG tablet Take 1 tablet (10 mg total) by mouth daily. 03/06/22   Setzer, Sandra J, PA-C  apixaban  (ELIQUIS ) 2.5 MG TABS tablet Take 1 tablet (2.5 mg total) by mouth 2 (two) times daily. 03/06/22   Setzer, Sandra J, PA-C  Blood Pressure Monitoring (OMRON 3 SERIES BP MONITOR) DEVI Use as directed. 03/29/24   Tolia, Sunit, DO  carvedilol  (COREG ) 25 MG tablet Take 1 tablet (25 mg total) by mouth 2 (two) times daily with a meal. 05/01/22 06/16/24  Ghimire, Donalda HERO, MD  Cholecalciferol  (VITAMIN D -3) 125 MCG (5000 UT) TABS Take 1 tablet by mouth in the morning.    [provider]  cinacalcet  (SENSIPAR ) 60 MG tablet Take 60 mg by mouth every dialysis (Tuesday, Thursday, and  Saturday).    [provider]  cloNIDine  (CATAPRES  - DOSED IN MG/24 HR) 0.2 mg/24hr patch Place 0.2 mg onto the skin once a week. 02/25/24   [provider]  hydrALAZINE  (APRESOLINE ) 100 MG tablet Take 1 tablet (100 mg total) by mouth every 8 (eight) hours. 12/18/22   Dann Candyce RAMAN, MD  isosorbide  mononitrate (IMDUR ) 30 MG 24 hr tablet TAKE 1 TABLET (30MG ) ON DIALYSIS DAYS AND 2 TABLETS (60 MG) ON NON DIALYSIS DAYS. 12/22/23   Tolia, Sunit, DO  LOKELMA  10 g PACK packet Take 10 g by mouth every Monday, Wednesday, and Friday. 07/30/23   [provider]  naloxone  (NARCAN ) nasal spray 4 mg/0.1 mL Place 1 spray into the nose as needed (opioid overdose). 02/19/22   [provider]  oxyCODONE -acetaminophen  (PERCOCET) 10-325 MG tablet Take 1 tablet by mouth in the morning, at noon, and at bedtime.    [provider]  pantoprazole  (PROTONIX ) 40 MG tablet TAKE 1 TABLET (40 MG TOTAL) BY MOUTH 2 (TWO) TIMES DAILY. 05/13/23   Tanda Bleacher, MD  rosuvastatin  (CRESTOR ) 10 MG tablet TAKE 1 TABLET (10 MG TOTAL) BY MOUTH DAILY FOR CHOLESTEROL 08/07/22   Tanda Bleacher, MD  sevelamer  carbonate (RENVELA ) 2.4 g PACK Take 2.4 g by mouth daily.    [provider]  traZODone  (DESYREL ) 50 MG tablet Take 25-50 mg by mouth at bedtime as needed for sleep.  03/19/23   [provider]    Allergies: Shrimp [shellfish allergy], Bactroban  [mupirocin ], Hydrocodone , Chlorhexidine  gluconate, Latex, Tylenol  [acetaminophen ], and Zestril  [lisinopril ]    Review of Systems  Updated Vital Signs BP (!) 185/109   Pulse 88   Temp (!) 97.1 F (36.2 C) (Temporal)   Resp (!) 24   Ht 5' 3 (1.6 m)   Wt 54.4 kg   SpO2 99%   BMI 21.26 kg/m   Physical Exam Vitals reviewed.  Cardiovascular:     Rate and Rhythm: Regular rhythm. Tachycardia present.  Pulmonary:     Effort: Respiratory distress present.     Breath sounds: Rales present.     Comments: Dialysis catheter right  chest wall. Abdominal:     Tenderness: There is no abdominal tenderness.  Musculoskeletal:     Cervical back: Neck supple.     Right lower leg: No edema.     Left lower leg: No edema.  Skin:    Capillary Refill: Capillary refill takes less than 2 seconds.  Neurological:     Mental Status: She is alert.     Comments: Awake, difficult to understand however due to BiPAP.     (all labs ordered are listed, but only abnormal results are displayed) Labs Reviewed  COMPREHENSIVE METABOLIC PANEL WITH GFR - Abnormal; Notable for the following components:      Result Value   Glucose, Bld 110 (*)    BUN 49 (*)    Creatinine, Ser 9.11 (*)    GFR, Estimated 4 (*)    Anion gap 18 (*)    All other components within normal limits  CBC WITH DIFFERENTIAL/PLATELET - Abnormal; Notable for the following components:   Hemoglobin 11.8 (*)    RDW 17.0 (*)    Lymphs Abs 0.5 (*)    All other components within normal limits  I-STAT VENOUS BLOOD GAS, ED - Abnormal; Notable for the following components:   pH, Ven 7.437 (*)    pCO2, Ven 39.2 (*)    pO2, Ven 141 (*)    Calcium , Ion 0.99 (*)    All other components within normal limits  RESP PANEL BY RT-PCR (RSV, FLU A&B, COVID)  RVPGX2  I-STAT ARTERIAL BLOOD GAS, ED  TROPONIN T, HIGH SENSITIVITY    EKG: EKG Interpretation Date/Time:  Thursday May 18 2024 07:03:35 EST Ventricular Rate:  100 PR Interval:  159 QRS Duration:  143 QT Interval:  417 QTC Calculation: 538 R Axis:   81  Text Interpretation: Sinus tachycardia Biatrial enlargement Right bundle branch block Confirmed by Patsey Lot [id number]) on 05/18/2024 7:44:12 AM  Radiology: DG Chest Portable 1 View Result Date: 05/18/2024 EXAM: 1 VIEW(S) XRAY OF THE CHEST 05/18/2024 07:33:00 AM COMPARISON: 02/14/2024 CLINICAL HISTORY: sob FINDINGS: LINES, TUBES AND DEVICES: Right chest tunneled dialysis catheter in place with tip terminating over the right atrium. Surgical clip in right  chest. LUNGS AND PLEURA: Small pleural effusions. There are hazy and reticular opacities present diffusely, particularly on the right, suggesting alveolar and interstitial edema. No pneumothorax. HEART AND MEDIASTINUM: Unchanged cardiomegaly. Calcified aorta. BONES AND SOFT TISSUES: Median sternotomy wires noted. No acute osseous abnormality. IMPRESSION: 1. Hazy and reticular opacities, particularly on the right, suggesting alveolar and interstitial edema. 2. Small pleural effusions. Electronically signed by: Evalene Coho MD 05/18/2024 08:10 AM EST RP Workstation: [id number]     Procedures   Medications Ordered in the ED  nitroGLYCERIN  50 mg in dextrose  5 % 250 mL (0.2 mg/mL) infusion (125 mcg/min  Intravenous Rate/Dose Change 05/18/24 1003)  fentaNYL  (SUBLIMAZE ) injection 50 mcg (has no administration in time range)                                    Medical Decision Making Amount and/or Complexity of Data Reviewed Labs: ordered. Radiology: ordered.  Risk Prescription drug management. Decision regarding hospitalization.   Patient with shortness of breath.  Hypoxia.  History of same particular with missed dialysis.  Blood pressure very elevated.  Does clinically have volume overload.  Will start nitroglycerin  drip.  EKG done does not show changes of severe hyperkalemia.  Started on BiPAP here.  Reviewed discharge note from most recent episode of similar symptoms.  X-ray does appear to show volume overload.  Normal potassium on blood work.  Started on nitro drip and have now been able to titrate off BiPAP.  Will admit to hospitalist and will discuss with nephrology.  ABG had been ordered.  However venous gas done initially and does show no CO2 retention.  Mental status has been improving also.    CRITICAL CARE Performed by: [place] Total critical care time: 30 minutes Critical care time was exclusive of separately billable procedures and treating other  patients. Critical care was necessary to treat or prevent imminent or life-threatening deterioration. Critical care was time spent personally by me on the following activities: development of treatment plan with patient and/or surrogate as well as nursing, discussions with consultants, evaluation of patient's response to treatment, examination of patient, obtaining history from patient or surrogate, ordering and performing treatments and interventions, ordering and review of laboratory studies, ordering and review of radiographic studies, pulse oximetry and re-evaluation of patient's condition.  Patient now complaining of more chest pain.  Will now add on troponin give some pain medicines.  Will repeat EKG.      Final diagnoses:  Acute pulmonary edema (HCC)  End stage renal disease on dialysis [facility])    ED Discharge Orders     None          River Rankin, MD 05/18/24 9078    River Rankin, MD 05/18/24 1004

## 2024-05-18 NOTE — Progress Notes (Signed)
 Placed patient on 4 L [place] BIPAP on standby for increased SOB/WOB. Sat 100%,  22, HR 84.

## 2024-05-18 NOTE — Progress Notes (Signed)
 PT placed on BIPAP at this time.   05/18/24 0704  BiPAP/CPAP/SIPAP  $ Non-Invasive Ventilator  Non-Invasive Vent Initial  $ Face Mask Medium Yes  BiPAP/CPAP/SIPAP Pt Type Adult  BiPAP/CPAP/SIPAP SERVO  Mask Type Full face mask  Dentures removed? Not applicable  Mask Size Medium  Set Rate 15 breaths/min  Respiratory Rate 30 breaths/min  IPAP 8 cmH20  EPAP 5 cmH2O  Pressure Support 13 cmH20  PEEP 5 cmH20  FiO2 (%) 60 %  Flow Rate 0 lpm  Minute Ventilation 10.7  Leak 10  Peak Inspiratory Pressure (PIP) 18  Tidal Volume (Vt) 501  Patient Home Machine No  Patient Home Mask No  Patient Home Tubing No  Auto Titrate No  Press High Alarm 25 cmH2O  Press Low Alarm 5 cmH2O  Nasal massage performed Yes  CPAP/SIPAP surface wiped down Yes  Device Plugged into RED Power Outlet Yes

## 2024-05-18 NOTE — Consult Note (Signed)
 Renal Service Consult Note [facility] Lamar JONETTA Fret, MD  Patient: Monique Henderson Date: 05/18/2024 Requesting Physician: Dr. Georgina  Reason for Consult: ESRD patient with shortness of breath HPI: The patient is a 73 y.o. year-old w/ PMH as below who presented to ED from home via EMS.  Patient called for shortness of breath.  On arrival O2 sat was 80% on room air and RR was 40/min.  She was using accessory muscles and rales were heard bilaterally.  She was 90% on NRB mask.  Patient was put on CPAP and given 1 nitroglycerin .  Blood pressure was 220/140.  In ED BP was 1 212/128, HR 94, RR 28, temp 97.  Patient has since been taken off CPAP and put on 4 L [place] O2.  Labs showed K+ 5.1, BUN 49, creatinine 9.1, WBC 7K and Hgb 11.8.  Chest x-ray showed interstitial and alveolar edema.  Patient received IV fentanyl , IV nitroglycerin .  Patient was admitted.  We are asked to see for dialysis.  Pt seen in ED room.  She is feeling okay right now.  She is not on oxygen  at home.  She wants to be on oxygen  at home.  She has not missed any dialysis her-last dialysis was on Tuesday.  Her schedule is TTS.  She rarely misses dialysis.  She goes to [facility].  No productive cough or fevers, chills or sweats.   ROS - denies CP, no joint pain, no HA, no blurry vision, no rash, no diarrhea, no nausea/ vomiting   Past Medical History  Past Medical History:  Diagnosis Date   Acute cardioembolic stroke (HCC) 02/26/2022   AF (paroxysmal atrial fibrillation) (HCC) 05/29/2019   on Coumadin    Aortic atherosclerosis 07/05/2019   Aortic dissection (HCC) 04/04/2019   s/p repair   Bone spur 2008   Right calcaneal foot spur   Breast cancer (HCC) 2004   Ductal carcinoma in situ of the left breast; S/P left partial mastectomy 02/26/2003; S/P re-excision of cranial and lateral margins11/18/2004.radiation   Carotid artery disease    Cerebral thrombosis with cerebral infarction 05/22/2019   Chronic HFrEF  (heart failure with reduced ejection fraction) (HCC)    Chronic low back pain 06/22/2016   Chronic obstructive lung disease (HCC) 01/16/2017   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 Deward Null; S/P re-excision of superior and inferior margins 10/27/2012.    Dissection of aorta (HCC) 04/03/2019   ESRD on hemodialysis (HCC) 05/29/2019   Essential hypertension 09/16/2006   GERD 09/16/2006   Hepatitis C    treated and RNA confirmed not detectable 01/2017   Insomnia 03/14/2015   Malnutrition of moderate degree 05/19/2019   Non compliance with medical treatment 12/04/2017   Normocytic anemia    With thrombocytosis   Osteoarthritis    Right ureteral stone 2002   S/P lumbar spinal fusion 01/18/2014   S/P lumbar decompressive laminectomy, fusion, and plating for lumbar spinal stenosis on 05/27/2009 by Dr. Alm CANDIE Molt.  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 Alm CANDIE Molt.     Stroke (cerebrum) [facility])    Stroke (HCC) 02/23/2022   SVT (supraventricular tachycardia) 08/28/2019   Tobacco use disorder 04/19/2009   Uterine fibroid    Wears dentures    top  Past Surgical History  Past Surgical History:  Procedure Laterality Date   ANTERIOR LAT LUMBAR FUSION N/A 01/18/2014   Procedure: ANTERIOR LATERAL LUMBAR FUSION LUMBAR TWO-THREE;  Surgeon: Alm GORMAN Molt, MD;  Location: [facility];  Service: Neurosurgery;  Laterality: N/A;   ANTERIOR LUMBAR FUSION  01/18/2014   AV FISTULA PLACEMENT Left 04/20/2019   Procedure: ARTERIOVENOUS (AV) FISTULA CREATION;  Surgeon: Sheree Penne Bruckner, MD;  Location: [facility] OR;  Service: Vascular;  Laterality: Left;   BACK SURGERY     BREAST LUMPECTOMY Left 01/2003   BREAST LUMPECTOMY Right 2014   BREAST  LUMPECTOMY WITH NEEDLE LOCALIZATION AND AXILLARY SENTINEL LYMPH NODE BX Right 10/13/2012   Procedure: BREAST LUMPECTOMY WITH NEEDLE LOCALIZATION;  Surgeon: Deward GORMAN Curvin DOUGLAS, MD;  Location: [facility];  Service: General;  Laterality: Right;  Right breast wire localized lumpectomy   DIALYSIS/PERMA CATHETER INSERTION N/A 05/28/2023   Procedure: DIALYSIS/PERMA CATHETER INSERTION;  Surgeon: Melia Lynwood ORN, MD;  Location: [facility];  Service: Cardiovascular;  Laterality: N/A;   INSERTION OF DIALYSIS CATHETER Right 04/20/2019   Procedure: INSERTION OF DIALYSIS CATHETER, right internal jugular;  Surgeon: Sheree Penne Bruckner, MD;  Location: [facility] OR;  Service: Vascular;  Laterality: Right;   INSERTION OF DIALYSIS CATHETER Right 09/24/2020   Procedure: INSERTION OF TUNNELED DIALYSIS CATHETER;  Surgeon: Oris Krystal FALCON, MD;  Location: [facility] OR;  Service: Vascular;  Laterality: Right;   IR FLUORO GUIDE CV LINE RIGHT  09/22/2020   IR THORACENTESIS ASP PLEURAL SPACE W/IMG GUIDE  05/19/2019   IR US  GUIDE VASC ACCESS LEFT  09/22/2020   IR US  GUIDE VASC ACCESS RIGHT  09/22/2020   IR VENOCAVAGRAM SVC  09/22/2020   LAMINECTOMY  05/27/2009   Lumbar decompressive laminectomy, fusion and plating for lumbar spinal stensosis   LIGATION OF ARTERIOVENOUS  FISTULA Left 09/24/2020   Procedure: LIGATION OF LEFT ARM ARTERIOVENOUS  FISTULA;  Surgeon: Oris Krystal FALCON, MD;  Location: [facility] OR;  Service: Vascular;  Laterality: Left;   LUMBAR LAMINECTOMY/DECOMPRESSION MICRODISCECTOMY Left 03/23/2013   Procedure: LUMBAR LAMINECTOMY/DECOMPRESSION MICRODISCECTOMY 1 LEVEL;  Surgeon: Alm GORMAN Molt, MD;  Location: [facility];  Service: Neurosurgery;  Laterality: Left;  LUMBAR LAMINECTOMY/DECOMPRESSION MICRODISCECTOMY 1 LEVEL   MASTECTOMY, PARTIAL Left 02/26/2003   ; S/P re-excision of cranial and lateral margins 04/19/2003.    RE-EXCISION OF BREAST CANCER,SUPERIOR MARGINS Right 10/27/2012   Procedure: RE-EXCISION OF BREAST  CANCER,SUPERIOR and inferior MARGINS;  Surgeon: Deward GORMAN Curvin DOUGLAS, MD;  Location: [facility] OR;  Service: General;  Laterality: Right;   RE-EXCISION OF BREAST LUMPECTOMY Left 04/2003   TEE WITHOUT CARDIOVERSION N/A 04/04/2019   Procedure: Transesophageal Echocardiogram (Tee);  Surgeon: German Bartlett PEDLAR, MD;  Location: [facility] OR;  Service: Open Heart Surgery;  Laterality: N/A;   THORACIC AORTIC ANEURYSM REPAIR N/A 04/04/2019   Procedure: THORACIC ASCENDING ANEURYSM REPAIR (AAA)  USING 28 MM X 30 CM HEMASHIELD PLATINUM VASCULAR GRAFT;  Surgeon: German Bartlett PEDLAR, MD;  Location: [facility] OR;  Service: Open Heart Surgery;  Laterality: N/A;   Family History  Family History  Problem Relation Age of Onset   Colon cancer Mother 73   Hypertension Mother    Diabetes Sister 54   Hypertension Sister    Diabetes Brother    Hypertension Brother    Diabetes Brother    Hypertension Brother    Kidney disease Son        s/p renal transplant   Hypertension Son    Diabetes  Son    Multiple sclerosis Son    Bone cancer Sister 85   Breast cancer Neg Hx    Cervical cancer Neg Hx    Social History  reports that she quit smoking about 3 years ago. Her smoking use included cigarettes. She started smoking about 47 years ago. She has a 11 pack-year smoking history. She has never used smokeless tobacco. She reports that she does not currently use alcohol  after a past usage of about 2.0 standard drinks of alcohol  per week. She reports that she does not currently use drugs after having used the following drugs: Cocaine . Allergies Allergies[1] Home medications Prior to Admission medications  Medication Sig Start Date End Date Taking? Authorizing Provider  acetaminophen  (TYLENOL ) 325 MG tablet Take 650 mg by mouth every 6 (six) hours as needed for mild pain (pain score 1-3) or moderate pain (pain score 4-6).    [provider]  albuterol  (PROVENTIL ) (2.5 MG/3ML) 0.083% nebulizer solution Take 3 mLs (2.5 mg total) by  nebulization every 6 (six) hours as needed for wheezing or shortness of breath. 08/23/23   Pokhrel, Vernal, MD  albuterol  (VENTOLIN  HFA) 108 (90 Base) MCG/ACT inhaler Inhale 2 puffs into the lungs every 6 (six) hours as needed. 10/02/23   [provider]  amLODipine  (NORVASC ) 10 MG tablet Take 1 tablet (10 mg total) by mouth daily. 03/06/22   Setzer, Sandra J, PA-C  apixaban  (ELIQUIS ) 2.5 MG TABS tablet Take 1 tablet (2.5 mg total) by mouth 2 (two) times daily. 03/06/22   Setzer, Sandra J, PA-C  Blood Pressure Monitoring (OMRON 3 SERIES BP MONITOR) DEVI Use as directed. 03/29/24   Tolia, Sunit, DO  carvedilol  (COREG ) 25 MG tablet Take 1 tablet (25 mg total) by mouth 2 (two) times daily with a meal. 05/01/22 06/16/24  Ghimire, Donalda HERO, MD  Cholecalciferol  (VITAMIN D3) 50 MCG (2000 UT) TABS Take 1 tablet by mouth daily. 04/21/24   [provider]  cinacalcet  (SENSIPAR ) 60 MG tablet Take 60 mg by mouth every dialysis (Tuesday, Thursday, and Saturday).    [provider]  cloNIDine  (CATAPRES  - DOSED IN MG/24 HR) 0.2 mg/24hr patch Place 0.2 mg onto the skin once a week. 02/25/24   [provider]  hydrALAZINE  (APRESOLINE ) 100 MG tablet Take 1 tablet (100 mg total) by mouth every 8 (eight) hours. 12/18/22   Dann Candyce RAMAN, MD  isosorbide  mononitrate (IMDUR ) 30 MG 24 hr tablet TAKE 1 TABLET (30MG ) ON DIALYSIS DAYS AND 2 TABLETS (60 MG) ON NON DIALYSIS DAYS. 12/22/23   Tolia, Sunit, DO  LOKELMA  10 g PACK packet Take 10 g by mouth every Monday, Wednesday, and Friday. 07/30/23   [provider]  naloxone  (NARCAN ) nasal spray 4 mg/0.1 mL Place 1 spray into the nose as needed (opioid overdose). 02/19/22   [provider]  oxyCODONE -acetaminophen  (PERCOCET) 10-325 MG tablet Take 1 tablet by mouth in the morning, at noon, and at bedtime.    [provider]  pantoprazole  (PROTONIX ) 40 MG tablet TAKE 1 TABLET (40 MG TOTAL) BY MOUTH 2 (TWO) TIMES DAILY.  05/13/23   Tanda Bleacher, MD  rosuvastatin  (CRESTOR ) 10 MG tablet TAKE 1 TABLET (10 MG TOTAL) BY MOUTH DAILY FOR CHOLESTEROL 08/07/22   Tanda Bleacher, MD  sevelamer  carbonate (RENVELA ) 2.4 g PACK Take 2.4 g by mouth daily.    [provider]  traZODone  (DESYREL ) 50 MG tablet Take 25-50 mg by mouth at bedtime as needed for sleep. 03/19/23   [provider]     Vitals:   05/18/24 1035 05/18/24 1036 05/18/24 1045 05/18/24 1105  BP: (!) 199/118  (!) 192/120 (!) 195/122  Pulse: 87 79 78 (!) 56  Resp: (!) 32 (!) 21 (!) 34 18  Temp:      TempSrc:      SpO2: 97% 92% 100% 100%  Weight:      Height:       Exam Gen alert, no distress, calm, on 5L [place] O2 Sclera anicteric, throat clear  No jvd or bruits Chest dec'd BS bilat bases, no rales/ wheezing RRR no MRG Abd soft ntnd no mass or ascites +bs Ext no LE or UE edema, no other edema Neuro is alert, Ox 3 , nf    RIJ TDC intact  Home bp meds: Norvasc  Coreg  Clonidine  Hydralazine    [facility]-> 3.5h  B400   43.5kg   2K bath  TDC Heparin  none    Assessment/ Plan: Respiratory distress: With hypoxemia and pulmonary edema by chest x-ray.  Suspect acute volume overload related to ESRD.  Patient says she has been drinking more than she should.  Has not missed dialysis.  Plan is for HD with max UF today. ESRD: on HD TTS. Last HD Tuesday. HD today as above.  HTN: uncontrolled HTN. Will dc IV ntg, start ntg paste, give IV hydralazine  10mg  for now.  Volume: not grossly overloaded, but must be vol up given CXR/ resp issues.  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         Myer Fret  MD [facility] 05/18/2024, 11:13 AM  Recent Labs  Lab 05/18/24 0717 05/18/24 0854 05/18/24 1030  HGB 11.8* 13.3 10.5*  ALBUMIN  4.7  --   --   CALCIUM  9.7  --   --   CREATININE 9.11*  --   --   K 5.1 5.0  --    Inpatient medications:  heparin   5,000 Units Subcutaneous Q8H    nitroGLYCERIN  150 mcg/min (05/18/24 1106)          [1]  Allergies Allergen Reactions   Shrimp [Shellfish Allergy] Shortness Of Breath   Bactroban  [Mupirocin ] Other (See Comments)    Sores in nose   Hydrocodone  Itching, Nausea And Vomiting and Nausea Only   Chlorhexidine  Gluconate Itching   Latex Itching    Latex gloves cause itchiness and a rash   Tylenol  [Acetaminophen ] Itching    Takes Percocet at home 05/06/23   Zestril  [Lisinopril ] Cough

## 2024-05-18 NOTE — ED Notes (Signed)
 Pt transported to dialysis

## 2024-05-18 NOTE — ED Notes (Signed)
 IV team at bedside

## 2024-05-18 NOTE — Progress Notes (Signed)
 Pt receives out-pt HD at [facility], TTS, 12pm, will continue to assist as needed.   Lavanda Lenzie Sandler Dialysis navigator [phone]

## 2024-05-18 NOTE — ED Notes (Signed)
 Report given to Krizelle, dialysis RN

## 2024-05-18 NOTE — H&P (Signed)
 History and Physical    Patient: Monique Henderson FMW:[id number] DOB: Dec 03, 1950 DOA: 05/18/2024 DOS: the patient was seen and examined on 05/18/2024 PCP: Patient, No Pcp Per  Patient coming from: Home  Chief Complaint:  Chief Complaint  Patient presents with   Respiratory Distress   HPI: Monique Henderson is a 73 y.o. female with medical history significant of ESRD on HD TThS c/b frequent admissions for fluids overload, CVA c/b R hemiparesis, PAF on Eliquis , active smoker, HFpEF(EF 45% in 06/2023) p/w HTN urgency/NSTEMI and SOB 2/2 volume overload.    Pt is a limited historian. From what I can gather per Epic review and brief interview, the pt presented with SOB that she think is related to being volume overloaded; of note, she denies missing any HD sessions.  In the ED, pt hypertensive (SBP 200s), and tachypneic w/ hyopxia (CPAP on arrival; now 4L [place]; on RA pta). Labs notable for Cr 9.34, and troponin 107. CXR showed hazy and reticular opacities, particularly on the right, suggesting alveolar and interstitial edema. EDP consulted Nephrology (HD pending), and medicine for admission.   Review of Systems: As mentioned in the history of present illness. All other systems reviewed and are negative. Past Medical History:  Diagnosis Date   Acute cardioembolic stroke (HCC) 02/26/2022   AF (paroxysmal atrial fibrillation) (HCC) 05/29/2019   on Coumadin    Aortic atherosclerosis 07/05/2019   Aortic dissection (HCC) 04/04/2019   s/p repair   Bone spur 2008   Right calcaneal foot spur   Breast cancer (HCC) 2004   Ductal carcinoma in situ of the left breast; S/P left partial mastectomy 02/26/2003; S/P re-excision of cranial and lateral margins11/18/2004.radiation   Carotid artery disease    Cerebral thrombosis with cerebral infarction 05/22/2019   Chronic HFrEF (heart failure with reduced ejection fraction) (HCC)    Chronic low back pain 06/22/2016   Chronic obstructive lung disease  (HCC) 01/16/2017   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 Deward Null; S/P re-excision of superior and inferior margins 10/27/2012.    Dissection of aorta (HCC) 04/03/2019   ESRD on hemodialysis (HCC) 05/29/2019   Essential hypertension 09/16/2006   GERD 09/16/2006   Hepatitis C    treated and RNA confirmed not detectable 01/2017   Insomnia 03/14/2015   Malnutrition of moderate degree 05/19/2019   Non compliance with medical treatment 12/04/2017   Normocytic anemia    With thrombocytosis   Osteoarthritis    Right ureteral stone 2002   S/P lumbar spinal fusion 01/18/2014   S/P lumbar decompressive laminectomy, fusion, and plating for lumbar spinal stenosis on 05/27/2009 by Dr. Alm CANDIE Molt.  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 Alm CANDIE Molt.     Stroke (cerebrum) (HCC)    Stroke (HCC) 02/23/2022   SVT (supraventricular tachycardia) 08/28/2019   Tobacco use disorder 04/19/2009   Uterine fibroid    Wears dentures    top   Past Surgical History:  Procedure Laterality Date   ANTERIOR LAT LUMBAR FUSION N/A 01/18/2014   Procedure: ANTERIOR LATERAL LUMBAR FUSION LUMBAR TWO-THREE;  Surgeon: Alm GORMAN Molt, MD;  Location: [facility];  Service: Neurosurgery;  Laterality: N/A;   ANTERIOR LUMBAR FUSION  01/18/2014   AV FISTULA PLACEMENT Left 04/20/2019   Procedure: ARTERIOVENOUS (AV)  FISTULA CREATION;  Surgeon: Sheree Penne Bruckner, MD;  Location: [facility] OR;  Service: Vascular;  Laterality: Left;   BACK SURGERY     BREAST LUMPECTOMY Left 01/2003   BREAST LUMPECTOMY Right 2014   BREAST LUMPECTOMY WITH NEEDLE LOCALIZATION AND AXILLARY SENTINEL LYMPH NODE BX Right 10/13/2012   Procedure: BREAST LUMPECTOMY WITH NEEDLE LOCALIZATION;  Surgeon:  Deward GORMAN Curvin DOUGLAS, MD;  Location: [facility];  Service: General;  Laterality: Right;  Right breast wire localized lumpectomy   DIALYSIS/PERMA CATHETER INSERTION N/A 05/28/2023   Procedure: DIALYSIS/PERMA CATHETER INSERTION;  Surgeon: Melia Lynwood ORN, MD;  Location: [facility];  Service: Cardiovascular;  Laterality: N/A;   INSERTION OF DIALYSIS CATHETER Right 04/20/2019   Procedure: INSERTION OF DIALYSIS CATHETER, right internal jugular;  Surgeon: Sheree Penne Bruckner, MD;  Location: [facility];  Service: Vascular;  Laterality: Right;   INSERTION OF DIALYSIS CATHETER Right 09/24/2020   Procedure: INSERTION OF TUNNELED DIALYSIS CATHETER;  Surgeon: Oris Krystal FALCON, MD;  Location: [facility] OR;  Service: Vascular;  Laterality: Right;   IR FLUORO GUIDE CV LINE RIGHT  09/22/2020   IR THORACENTESIS ASP PLEURAL SPACE W/IMG GUIDE  05/19/2019   IR US  GUIDE VASC ACCESS LEFT  09/22/2020   IR US  GUIDE VASC ACCESS RIGHT  09/22/2020   IR VENOCAVAGRAM SVC  09/22/2020   LAMINECTOMY  05/27/2009   Lumbar decompressive laminectomy, fusion and plating for lumbar spinal stensosis   LIGATION OF ARTERIOVENOUS  FISTULA Left 09/24/2020   Procedure: LIGATION OF LEFT ARM ARTERIOVENOUS  FISTULA;  Surgeon: Oris Krystal FALCON, MD;  Location: [facility] OR;  Service: Vascular;  Laterality: Left;   LUMBAR LAMINECTOMY/DECOMPRESSION MICRODISCECTOMY Left 03/23/2013   Procedure: LUMBAR LAMINECTOMY/DECOMPRESSION MICRODISCECTOMY 1 LEVEL;  Surgeon: Alm GORMAN Molt, MD;  Location: [facility];  Service: Neurosurgery;  Laterality: Left;  LUMBAR LAMINECTOMY/DECOMPRESSION MICRODISCECTOMY 1 LEVEL   MASTECTOMY, PARTIAL Left 02/26/2003   ; S/P re-excision of cranial and lateral margins 04/19/2003.    RE-EXCISION OF BREAST CANCER,SUPERIOR MARGINS Right 10/27/2012   Procedure: RE-EXCISION OF BREAST CANCER,SUPERIOR and inferior MARGINS;  Surgeon: Deward GORMAN Curvin DOUGLAS, MD;  Location: [facility] OR;  Service: General;  Laterality: Right;   RE-EXCISION OF BREAST  LUMPECTOMY Left 04/2003   TEE WITHOUT CARDIOVERSION N/A 04/04/2019   Procedure: Transesophageal Echocardiogram (Tee);  Surgeon: German Bartlett PEDLAR, MD;  Location: [facility] OR;  Service: Open Heart Surgery;  Laterality: N/A;   THORACIC AORTIC ANEURYSM REPAIR N/A 04/04/2019   Procedure: THORACIC ASCENDING ANEURYSM REPAIR (AAA)  USING 28 MM X 30 CM HEMASHIELD PLATINUM VASCULAR GRAFT;  Surgeon: German Bartlett PEDLAR, MD;  Location: [facility] OR;  Service: Open Heart Surgery;  Laterality: N/A;   Social History:  reports that she quit smoking about 3 years ago. Her smoking use included cigarettes. She started smoking about 47 years ago. She has a 11 pack-year smoking history. She has never used smokeless tobacco. She reports that she does not currently use alcohol  after a past usage of about 2.0 standard drinks of alcohol  per week. She reports that she does not currently use drugs after having used the following drugs: Cocaine .  Allergies[1]  Family History  Problem Relation Age of Onset   Colon cancer Mother 62   Hypertension Mother    Diabetes Sister 58   Hypertension Sister    Diabetes Brother    Hypertension Brother    Diabetes Brother    Hypertension Brother    Kidney disease Son  s/p renal transplant   Hypertension Son    Diabetes Son    Multiple sclerosis Son    Bone cancer Sister 55   Breast cancer Neg Hx    Cervical cancer Neg Hx     Prior to Admission medications  Medication Sig Start Date End Date Taking? Authorizing Provider  acetaminophen  (TYLENOL ) 325 MG tablet Take 650 mg by mouth every 6 (six) hours as needed for mild pain (pain score 1-3) or moderate pain (pain score 4-6).    [provider]  albuterol  (PROVENTIL ) (2.5 MG/3ML) 0.083% nebulizer solution Take 3 mLs (2.5 mg total) by nebulization every 6 (six) hours as needed for wheezing or shortness of breath. 08/23/23   Pokhrel, Laxman, MD  albuterol  (VENTOLIN  HFA) 108 (90 Base) MCG/ACT inhaler Inhale 2 puffs into the lungs  every 6 (six) hours as needed. 10/02/23   [provider]  amLODipine  (NORVASC ) 10 MG tablet Take 1 tablet (10 mg total) by mouth daily. 03/06/22   Setzer, Sandra J, PA-C  apixaban  (ELIQUIS ) 2.5 MG TABS tablet Take 1 tablet (2.5 mg total) by mouth 2 (two) times daily. 03/06/22   Setzer, Sandra J, PA-C  Blood Pressure Monitoring (OMRON 3 SERIES BP MONITOR) DEVI Use as directed. 03/29/24   Tolia, Sunit, DO  carvedilol  (COREG ) 25 MG tablet Take 1 tablet (25 mg total) by mouth 2 (two) times daily with a meal. 05/01/22 06/16/24  Ghimire, Donalda HERO, MD  Cholecalciferol  (VITAMIN D3) 50 MCG (2000 UT) TABS Take 1 tablet by mouth daily. 04/21/24   [provider]  cinacalcet  (SENSIPAR ) 60 MG tablet Take 60 mg by mouth every dialysis (Tuesday, Thursday, and Saturday).    [provider]  cloNIDine  (CATAPRES  - DOSED IN MG/24 HR) 0.2 mg/24hr patch Place 0.2 mg onto the skin once a week. 02/25/24   [provider]  hydrALAZINE  (APRESOLINE ) 100 MG tablet Take 1 tablet (100 mg total) by mouth every 8 (eight) hours. 12/18/22   Dann Candyce RAMAN, MD  isosorbide  mononitrate (IMDUR ) 30 MG 24 hr tablet TAKE 1 TABLET (30MG ) ON DIALYSIS DAYS AND 2 TABLETS (60 MG) ON NON DIALYSIS DAYS. 12/22/23   Michele, Sunit, DO  LOKELMA  10 g PACK packet Take 10 g by mouth every Monday, Wednesday, and Friday. 07/30/23   [provider]  naloxone  (NARCAN ) nasal spray 4 mg/0.1 mL Place 1 spray into the nose as needed (opioid overdose). 02/19/22   [provider]  oxyCODONE -acetaminophen  (PERCOCET) 10-325 MG tablet Take 1 tablet by mouth in the morning, at noon, and at bedtime.    [provider]  pantoprazole  (PROTONIX ) 40 MG tablet TAKE 1 TABLET (40 MG TOTAL) BY MOUTH 2 (TWO) TIMES DAILY. 05/13/23   Tanda Bleacher, MD  rosuvastatin  (CRESTOR ) 10 MG tablet TAKE 1 TABLET (10 MG TOTAL) BY MOUTH DAILY FOR CHOLESTEROL 08/07/22   Tanda Bleacher, MD  sevelamer  carbonate (RENVELA ) 2.4 g PACK Take  2.4 g by mouth daily.    [provider]  traZODone  (DESYREL ) 50 MG tablet Take 25-50 mg by mouth at bedtime as needed for sleep. 03/19/23   [provider]    Physical Exam: Vitals:   05/18/24 1140 05/18/24 1145 05/18/24 1150 05/18/24 1200  BP:  (!) 193/98 (!) 190/100 (!) 183/100  Pulse:  68 82 79  Resp:  (!) 25 16 20   Temp: 97.6 F (36.4 C)     TempSrc: Temporal     SpO2:  100% 100% 100%  Weight:  Height:       General: Alert, oriented x3, resting comfortably in no acute distress HEENT: EOMI, oropharynx clear, moist mucous membranes, hearing intact Neck: Trachea midline and no gross thyromegaly Respiratory: Lungs clear to auscultation bilaterally with normal respiratory effort; no w/r/r Cardiovascular: Regular rate and rhythm w/o m/r/g Abdomen: Soft, nontender, nondistended. Positive bowel sounds MSK: No obvious joint deformities or swelling Skin: No obvious rashes or lesions Neurologic: Awake, alert, spontaneously moves all extremities, strength intact Psychiatric: Appropriate mood and affect, conversational and cooperative   Data Reviewed:  Lab Results  Component Value Date   WBC 6.6 05/18/2024   HGB 10.5 (L) 05/18/2024   HCT 33.4 (L) 05/18/2024   MCV 94.4 05/18/2024   PLT 211 05/18/2024   Lab Results  Component Value Date   GLUCOSE 110 (H) 05/18/2024   CALCIUM  9.7 05/18/2024   NA 139 05/18/2024   K 5.0 05/18/2024   CO2 24 05/18/2024   CL 99 05/18/2024   BUN 49 (H) 05/18/2024   CREATININE 9.34 (H) 05/18/2024   Lab Results  Component Value Date   ALT <5 05/18/2024   AST 21 05/18/2024   GGT 26 12/09/2016   ALKPHOS 88 05/18/2024   BILITOT 0.5 05/18/2024   Lab Results  Component Value Date   INR 1.1 06/25/2023   INR 1.1 06/02/2023   INR 1.2 09/25/2022   Radiology: DG Chest Portable 1 View Result Date: 05/18/2024 EXAM: 1 VIEW(S) XRAY OF THE CHEST 05/18/2024 07:33:00 AM COMPARISON: 02/14/2024 CLINICAL HISTORY: sob FINDINGS: LINES,  TUBES AND DEVICES: Right chest tunneled dialysis catheter in place with tip terminating over the right atrium. Surgical clip in right chest. LUNGS AND PLEURA: Small pleural effusions. There are hazy and reticular opacities present diffusely, particularly on the right, suggesting alveolar and interstitial edema. No pneumothorax. HEART AND MEDIASTINUM: Unchanged cardiomegaly. Calcified aorta. BONES AND SOFT TISSUES: Median sternotomy wires noted. No acute osseous abnormality. IMPRESSION: 1. Hazy and reticular opacities, particularly on the right, suggesting alveolar and interstitial edema. 2. Small pleural effusions. Electronically signed by: Evalene Coho MD 05/18/2024 08:10 AM EST RP Workstation: [id number]    Assessment and Plan: 26F h/o ESRD on HD TThS c/b frequent admissions for fluids overload, CVA c/b R hemiparesis, PAF on Eliquis , active smoker, HFpEF(EF 45% in 06/2023) p/w HTN urgency/NSTEMI and SOB 2/2 volume overload.    SOB 2/2 volume overload iso ESRD -RD consulted for low sodium and renal diet teaching; apprec eval/recs -Renal consulted; apprec eval/recs (agree w/ HD planned fro today) -PTA Sensipar  60mg  on TThS, Renvela  2.4g daily, and Lokelma  10g MWF -PTA Crestor  10mg  daily -Duonebs prn  HTN urgency NSTEMI -PTA Coreg , amlodipine , and hydralazine  -HOLD pta clonidine  and Imdur   PAF -PTA Eliquis  2.5mg  BID and Coreg  25mg  BID   Advance Care Planning:   Code Status: Full Code   Consults: Nephrology  Family Communication: Son  Severity of Illness: The appropriate patient status for this patient is INPATIENT. Inpatient status is judged to be reasonable and necessary in order to provide the required intensity of service to ensure the patient's safety. The patient's presenting symptoms, physical exam findings, and initial radiographic and laboratory data in the context of their chronic comorbidities is felt to place them at high risk for further clinical deterioration. Furthermore,  it is not anticipated that the patient will be medically stable for discharge from the hospital within 2 midnights of admission.   * I certify that at the point of admission it is my clinical judgment that the  patient will require inpatient hospital care spanning beyond 2 midnights from the point of admission due to high intensity of service, high risk for further deterioration and high frequency of surveillance required.*   ------- I spent 57 minutes reviewing previous notes, at the bedside counseling/discussing the treatment plan, and performing clinical documentation.  Author: Marsha Ada, MD 05/18/2024 12:21 PM  For on call review [email or web address].     [1]  Allergies Allergen Reactions   Shrimp [Shellfish Allergy] Shortness Of Breath   Bactroban  [Mupirocin ] Other (See Comments)    Sores in nose   Hydrocodone  Itching, Nausea And Vomiting and Nausea Only   Chlorhexidine  Gluconate Itching   Latex Itching    Latex gloves cause itchiness and a rash   Tylenol  Allisoun.arm ] Itching    Takes Percocet at home 05/06/23   Zestril  [Lisinopril ] Cough

## 2024-05-18 NOTE — ED Triage Notes (Signed)
 Pt BIB [facility] from home. Pt is a dialysis pt, T,Th,Sat. She missed tues. Called EMS for [facility]. Upon EMS arrival 80% RA and 40RR. EMS reports rales bilaterally, accessory muscles use. 88-90% on NRB. EMS placed pt on CPAP and gave 1 nitroglycerin . GCS 15  EMS 220/140BP 112cbg 20-27CAP 24 R Hand

## 2024-05-18 NOTE — Progress Notes (Signed)
 Pt. Came in with SOB and on 02 at 4LPm via nasal cannula, awake and oriented. Consent given and on file. Started with high blood pressure and high respiratory rate.  UF removed: 300 ml Tx duration: 3.5L  Access used: right CVC Access issue: Pt. Stated that she has a little soreness on th access.  Change dressing. Dressing change due: 05/25/2024  Pt experienced severe cramping during tx. Gave 200cc of PNSS. Unable to remove more fluids.  Tx completed. Catheter dwelled and locked with heparin . Endorsed to floor nurse. Transported to room.  Jailine Lieder Rubi Ringo Sherod, RN [facility]

## 2024-05-18 NOTE — ED Notes (Signed)
Dr. Moore at bedside.

## 2024-05-19 DIAGNOSIS — N186 End stage renal disease: Secondary | ICD-10-CM | POA: Diagnosis not present

## 2024-05-19 LAB — CBC
HCT: 32.3 % — ABNORMAL LOW (ref 36.0–46.0)
Hemoglobin: 10.2 g/dL — ABNORMAL LOW (ref 12.0–15.0)
MCH: 29.1 pg (ref 26.0–34.0)
MCHC: 31.6 g/dL (ref 30.0–36.0)
MCV: 92.3 fL (ref 80.0–100.0)
Platelets: 216 K/uL (ref 150–400)
RBC: 3.5 MIL/uL — ABNORMAL LOW (ref 3.87–5.11)
RDW: 16.7 % — ABNORMAL HIGH (ref 11.5–15.5)
WBC: 3.7 K/uL — ABNORMAL LOW (ref 4.0–10.5)
nRBC: 0 % (ref 0.0–0.2)

## 2024-05-19 LAB — BASIC METABOLIC PANEL WITH GFR
Anion gap: 12 (ref 5–15)
BUN: 25 mg/dL — ABNORMAL HIGH (ref 8–23)
CO2: 28 mmol/L (ref 22–32)
Calcium: 9.2 mg/dL (ref 8.9–10.3)
Chloride: 95 mmol/L — ABNORMAL LOW (ref 98–111)
Creatinine, Ser: 5.58 mg/dL — ABNORMAL HIGH (ref 0.44–1.00)
GFR, Estimated: 8 mL/min — ABNORMAL LOW
Glucose, Bld: 102 mg/dL — ABNORMAL HIGH (ref 70–99)
Potassium: 4.8 mmol/L (ref 3.5–5.1)
Sodium: 135 mmol/L (ref 135–145)

## 2024-05-19 LAB — HEPATITIS B SURFACE ANTIBODY, QUANTITATIVE: Hep B S AB Quant (Post): 51.6 m[IU]/mL

## 2024-05-19 NOTE — Discharge Planning (Signed)
 [facility] Patient Discharge Orders - [facility] CLINIC: [facility]  Patient's name: Monique Henderson Admit/DC Dates: 05/18/2024 - 05/19/2024  DISCHARGE DIAGNOSES: Acute hypoxic Resp Failure - bronchitis/COPD exacerbation  Mild pulm edema on imaging  HD ORDER CHANGES: Heparin  change: no EDW Change: YES New EDW: 46kg Bath Change: no  ANEMIA MANAGEMENT: Aranesp : Given: no   ESA dose for discharge: Per protocol IV Iron dose at discharge: Per protocol Transfusion: Given: no  BONE/MINERAL MEDICATIONS: Hectorol /Calcitriol change: no Sensipar /Parsabiv change: no  ACCESS INTERVENTION/CHANGE: no Details:   RECENT LABS: Recent Labs  Lab 05/18/24 0717 05/18/24 0854 05/19/24 0310  HGB 11.8*   < > 10.2*  NA 140   < > 135  K 5.1   < > 4.8  CALCIUM  9.7  --  9.2  ALBUMIN  4.7  --   --    < > = values in this interval not displayed.    IV ANTIBIOTICS: no Details:  OTHER ANTICOAGULATION: yes Details: On Eliquis   OTHER/APPTS/LAB ORDERS: - Will be going home on O2 2L/min - pls make sure is on O2 during HD 2L chronically  D/C Meds to be reconciled by nurse after every discharge.  Completed By: Izetta Boehringer, PA-C [facility] Pager [phone]   Reviewed by: MD:______ RN_______

## 2024-05-19 NOTE — Progress Notes (Signed)
 Mobility Specialist Progress Note:  Nurse requested Mobility Specialist to perform oxygen  saturation test with pt which includes removing pt from oxygen  both at rest and while ambulating.  Below are the results from that testing.     Patient Saturations on Room Air at Rest = spO2 95%  Patient Saturations on Room Air while Ambulating = sp02 85% .  Rested and performed pursed lip breathing for 1 minute with sp02 at 87%.  Patient Saturations on 3 Liters of oxygen  while Ambulating = sp02 90%  At end of testing pt left in room on 3  Liters of oxygen . Pt found on 3L/min so MS placed back on 3L/min per RN   Reported results to nurse.    Thersia Minder Mobility Specialist  Please contact vis Secure Chat or  Rehab Office [phone]

## 2024-05-19 NOTE — Progress Notes (Signed)
 " [facility] Progress Note   Subjective:   Seen in room - just walked with RN, became dyspneic/hypoxic and O2 back on. No CP. Severe cramping with HD yesterday - didn't get much off. Try again tomorrow.  Objective Vitals:   05/19/24 0300 05/19/24 0420 05/19/24 0424 05/19/24 0756  BP: (!) 146/104 (!) 181/117  (!) 191/123  Pulse: (!) 101 97 90 99  Resp: 20 (!) 31 13 20   Temp:  98.4 F (36.9 C)  98.6 F (37 C)  TempSrc:  Oral  Oral  SpO2: 97% 97% 97% 96%  Weight:  48.4 kg    Height:       Physical Exam General: Chronically ill appearing woman, NAD. [place] O2 in place Heart: Tachycardic, no murmur Lungs: CTA in upper lobes, L base rales Abdomen: soft Extremities: no LE edema Dialysis Access: [facility]  Additional Objective Labs: Basic Metabolic Panel: Recent Labs  Lab 05/18/24 0717 05/18/24 0854 05/18/24 1012 05/19/24 0310  NA 140 139  --  135  K 5.1 5.0  --  4.8  CL 99  --   --  95*  CO2 24  --   --  28  GLUCOSE 110*  --   --  102*  BUN 49*  --   --  25*  CREATININE 9.11*  --  9.34* 5.58*  CALCIUM  9.7  --   --  9.2   Liver Function Tests: Recent Labs  Lab 05/18/24 0717  AST 21  ALT <5  ALKPHOS 88  BILITOT 0.5  PROT 8.0  ALBUMIN  4.7   CBC: Recent Labs  Lab 05/18/24 0717 05/18/24 0854 05/18/24 1030 05/19/24 0310  WBC 7.5  --  6.6 3.7*  NEUTROABS 6.1  --   --   --   HGB 11.8* 13.3 10.5* 10.2*  HCT 38.5 39.0 33.4* 32.3*  MCV 95.3  --  94.4 92.3  PLT 245  --  211 216   Studies/Results: DG Chest Portable 1 View Result Date: 05/18/2024 EXAM: 1 VIEW(S) XRAY OF THE CHEST 05/18/2024 07:33:00 AM COMPARISON: 02/14/2024 CLINICAL HISTORY: sob FINDINGS: LINES, TUBES AND DEVICES: Right chest tunneled dialysis catheter in place with tip terminating over the right atrium. Surgical clip in right chest. LUNGS AND PLEURA: Small pleural effusions. There are hazy and reticular opacities present diffusely, particularly on the right, suggesting alveolar and  interstitial edema. No pneumothorax. HEART AND MEDIASTINUM: Unchanged cardiomegaly. Calcified aorta. BONES AND SOFT TISSUES: Median sternotomy wires noted. No acute osseous abnormality. IMPRESSION: 1. Hazy and reticular opacities, particularly on the right, suggesting alveolar and interstitial edema. 2. Small pleural effusions. Electronically signed by: Timothy Berrigan MD 05/18/2024 08:10 AM EST RP Workstation: [id number]   Medications:   amLODipine   10 mg Oral Daily   apixaban   2.5 mg Oral BID   carvedilol   25 mg Oral BID WC   [START ON 05/20/2024] cinacalcet   60 mg Oral Q T,Th,Sa-HD   hydrALAZINE   100 mg Oral Q8H   nitroGLYCERIN   1 inch Topical Q8H   pantoprazole   40 mg Oral BID   rosuvastatin   10 mg Oral Daily   sevelamer  carbonate  2.4 g Oral Q lunch   sodium zirconium cyclosilicate   10 g Oral Q M,W,F   Dialysis Orders TTS - AF 3:30hr, 400/A1.5, EDW 45, 2K/2Ca bath, TDC, no heparin  - Hectoral 7mcg IV q HD - Sensipar  60mg  PO q HD - no ESA - Venofer  50mg  weekly - --> Leaves consistently ~48kg range.  Assessment/Plan: AHRF: Remains on O2.  CXR with pulm edema. Baseline COPD. Consider O2 at home. See if can get volume down tomorrow. ESRD: Usual TTS schedule -> next HD tomorrow. HTN/volume: BP high, does come down somewhat with HD. Consider adding another agent. Anemia of ESRD: Hgb in goal - hold on ESA for now. Secondary HPTH: Ca ok, Phos pending. Continue home meds.  FYI -- Holiday Schedule for next week Usual MWF schedule -> Sun (21), Tues (23), Friday (26) Usual TTS schedule -> Mon (22), Wed (24), Sat (27)   Katie Shabria Egley, PA-C 05/19/2024, 9:30 AM   [facility]    "

## 2024-05-19 NOTE — Care Management Obs Status (Signed)
 MEDICARE OBSERVATION STATUS NOTIFICATION   Patient Details  Name: Monique Henderson MRN: [id number] Date of Birth: May 08, 1951   Medicare Observation Status Notification Given:     Obs signed and copy given   Claretta Deed 05/19/2024, 10:39 AM

## 2024-05-19 NOTE — Progress Notes (Signed)
 D/c orders noted. Contacted out-pt HD clinic [facility] [place] and informed of pt d/c and anticipated arrival back to clinic on sat. No further support is needed.     Lavanda Shaindy Reader Dialysis Navigator [phone]

## 2024-05-19 NOTE — Telephone Encounter (Signed)
 Spoke with sister Orlean Buddle per DPR. Relayed Dr. Tyree note. All concerns addressed. Sister advised patient was currently in hospital for Pulmonary Edema and will schedule OV sooner than April if recommended upon discharge.

## 2024-05-19 NOTE — Progress Notes (Signed)
 AVS gone over with patient, ivs removed, home oxygen  delivered and patient was wheeled out to family vehicle.

## 2024-05-19 NOTE — Progress Notes (Signed)
 Initial Nutrition Assessment  DOCUMENTATION CODES:   Severe malnutrition in context of chronic illness  INTERVENTION:  Add renal MVI w/ minerals Add Magic cup TID with meals, each supplement provides 290 kcal and 9 grams of protein  Double portion proteins to meal trays Discussed importance of adherence with BP meds and fluid allowance as dictated by dialysis center Draw PHOS to assess recent baseline/mineral bone status  NUTRITION DIAGNOSIS:   Severe Malnutrition related to chronic illness as evidenced by severe muscle depletion, severe fat depletion.   GOAL:   Patient will meet greater than or equal to 90% of their needs   MONITOR:   PO intake, Supplement acceptance, Labs  REASON FOR ASSESSMENT:   Consult Diet education  ASSESSMENT:    Pt with PMH significant for: ESRD-HD, CVA w/ R hemiparesis, PAF (Eliquis ),  HFpEF 45% (06/2023). Presented with hypertensive urgency/NSTEMI and SOB. Of note, she is with >10 admissions this year usually for volume overload. Last admission in September for hyperkalemia.    Receiving HD in an effort to remove fluid. Unsuccessful in UF pull yesterday as she experienced severe cramping on tx. She reports going to her dialysis treatments and taking blood pressure medications, however noted with hx of non-adherence.    Average Meal Intake No documented intake to review  She is sleeping in bed at time of assessment, but easily aroused. Reports no recent issues chewing or swallowing. Had misplaced her bottom dentures at time of last bedside visit in May of this year, but they have been replaced and fit well. Bowels have been stable with 1-2 per day reported at baseline.  She endorses appetite at baseline prior to admission. Breakfast tray at bedside observed with approximately 75% of meal consumed. Per last admission, she is not open to nutrition supplements. Confirms this today as well. Will add Magic Cup to meal trays and double proteins as well  as renal MVI, as this was effective during last admission.    24 Hour Recall B: sausage, egg, grits, toast w/ coffee and milk L: baked chicken w/ creamed potatoes and mixed vegetables D: piece of fruit (peach or apple) Snacks: rarely  She reports that she continues to live with her son, who does most of the cooking. Unsuccessful in eliciting accurate 24 hour recall. Last admission, 24 hour recall above was reported.    Admit/Current Weight: 46.2kg EDW: 46kg  Last HD tx: 12/18 UF removed: 300 mL - limited fluid pull d/t severe cramping   No significant weight changes noted in last six months, even with some weight gain. Average UF at outpatient treatments is 3.5L, per patient report. Discussed implications of excessive fluid pulls and fluid overload on patient's health. She verbalizing understanding, however follow through questionable as at time of last assessment she reported she would drink whatever she wanted due to excessive thirst. As evidenced by her nutrition-focused physical exam, she meets criteria for severe malnutrition in the context of chronic illness.    No edema on exam. Reported UBW around 107lbs, which is around her currently measured body weight. Given report of extreme cramping during tx yesterday, could be approaching true body weight or not able to tolerate excessive fluid pull, as she reports cramping at outpatient clinic as well. May benefit from lengthening outpatient treatment time to slow fluid pull and promote tolerance. Otherwise, may need target weight adjustment on discharge.   As evidenced by sodium trends as well as albumin  level, she does not appear to be significantly fluid overloaded,  which would falsely lower these lab values. Likely that she is mildly overloaded, which exacerbates her COPD as she continues to smoke and has chronic dyspnea.    Drains/Lines: R subclavian: Tunneled dialysis catheter, placed 05/28/2023 UOP: anuric at baseline  Dialysis  Orders TTS - AF 3:30hr, 400/A1.5, EDW 45, 2K/2Ca bath, TDC, no heparin  - Hectoral 7mcg IV q HD - Sensipar  60mg  PO q HD - no ESA - Venofer  50mg  weekly - --> Leaves consistently ~48kg range.   She is on 2K bath at OP HD clinic. Has not required any repletion. Monitoring electrolytes. Will add renal MVI. No new PHOS to review, but appears she is on calcimimetic therapy, phosphorus binders, and potassium lowering medication at baseline. Would benefit from PHOS draw.    Meds: pantoprazole , Sensipar , Renvela , Lokelma    Labs:  Na+ 135 (wdl) K+ 4.8 (wdl) WBC 3.7 (L) CBGs 102-110 x24 hours A1c 4.1 (08/2023)  NUTRITION - FOCUSED PHYSICAL EXAM:  Flowsheet Row Most Recent Value  Orbital Region Moderate depletion  Upper Arm Region Severe depletion  Thoracic and Lumbar Region Severe depletion  Buccal Region Mild depletion  Temple Region Moderate depletion  Clavicle Bone Region Severe depletion  Clavicle and Acromion Bone Region Severe depletion  Scapular Bone Region Severe depletion  Dorsal Hand Mild depletion  Patellar Region Severe depletion  Anterior Thigh Region Severe depletion  Posterior Calf Region Severe depletion  Edema (RD Assessment) None  Hair Reviewed  Eyes Reviewed  Mouth Reviewed  Skin Reviewed  Nails Reviewed    Diet Order:   Diet Order             Diet renal with fluid restriction Fluid restriction: 1200 mL Fluid; Room service appropriate? Yes; Fluid consistency: Thin  Diet effective now             EDUCATION NEEDS:   Education needs have been addressed  Skin:  Skin Assessment: Reviewed RN Assessment  Last BM:  PTA  Height:  Ht Readings from Last 1 Encounters:  05/18/24 5' 3 (1.6 m)   Weight:  Wt Readings from Last 1 Encounters:  05/19/24 48.4 kg    Ideal Body Weight:  52.3 kg  BMI:  Body mass index is 18.9 kg/m.  Estimated Nutritional Needs:   Kcal:  1500-1700 kcals  Protein:  60-75g  Fluid:  1L + UOP  Monique Deaner MS, RD,  LDN Registered Dietitian Clinical Nutrition RD Inpatient Contact Info in Amion

## 2024-05-19 NOTE — Discharge Summary (Addendum)
 "  Physician Discharge Summary  IVETT LUEBBE FMW:[id number] DOB: 1951/04/08 DOA: 05/18/2024  PCP: Patient, No Pcp Per  Admit date: 05/18/2024 Discharge date: 05/19/2024  Admitted From: home Disposition:  home  Recommendations for Outpatient Follow-up:  Follow up with PCP in 1-2 weeks Follow-up with HD tomorrow as scheduled  Home Health: none Equipment/Devices: home O2   Discharge Condition: stable CODE STATUS: Full code Diet Orders (From admission, onward)     Start     Ordered   05/18/24 1915  Diet renal with fluid restriction Fluid restriction: 1200 mL Fluid; Room service appropriate? Yes; Fluid consistency: Thin  Diet effective now       Question Answer Comment  Fluid restriction: 1200 mL Fluid   Room service appropriate? Yes   Fluid consistency: Thin      05/18/24 1914            HPI: Per admitting MD, 73 year old female with history of ESRD on HD TTS with frequent admissions for fluid overload, CVA with right hemiparesis, PAF on Eliquis , active smoker underlying COPD, chronic systolic CHF comes into the hospital with shortness of breath in the setting of volume overload.  Nephrology was consulted and she was admitted to the hospital  Hospital Course / Discharge diagnoses: Principal problem Dyspnea -this is likely multifactorial with very mild fluid overload in the setting of nonadherence to fluid restriction as well as underlying tobacco use, COPD and a degree of chronicity to her dyspnea.  Nephrology consulted, she was dialyzed but could not pull much fluid off.  Following dialysis she was feeling much better, back to baseline, ambulating in the hallway and asking to go home.  She does qualify for home oxygen  and she is requiring 2 L, which I suspect this is chronic given longstanding smoking and dyspnea  Active problems ESRD-continue home medications, nephrology consulted here and she underwent a dialysis episode.  Continue HD as an outpatient,  tomorrow Essential hypertension -blood pressure elevated, continue home medications, underwent dialysis Tobacco use-counseled for cessation COPD -outpatient follow-up, no wheezing, respiratory status appears at baseline.  She does require oxygen  as above PAF-continue home medications Hyperlipidemia-continue statin Anemia of chronic kidney disease-hemoglobin stable, no bleeding Elevated troponin-flat, not in a pattern consistent with ACS, this is likely due to #1  Sepsis ruled out   Discharge Instructions   Allergies as of 05/19/2024       Reactions   Shrimp [shellfish Allergy] Shortness Of Breath   Bactroban  [mupirocin ] Other (See Comments)   Sores in nose   Hydrocodone  Itching, Nausea And Vomiting, Nausea Only   Chlorhexidine  Gluconate Itching   Latex Itching   Latex gloves cause itchiness and a rash   Tylenol  [acetaminophen ] Itching   Takes Percocet at home 05/06/23   Zestril  [lisinopril ] Cough        Medication List     TAKE these medications    acetaminophen  325 MG tablet Commonly known as: TYLENOL  Take 650 mg by mouth every 6 (six) hours as needed for mild pain (pain score 1-3) or moderate pain (pain score 4-6).   albuterol  (2.5 MG/3ML) 0.083% nebulizer solution Commonly known as: PROVENTIL  Take 3 mLs (2.5 mg total) by nebulization every 6 (six) hours as needed for wheezing or shortness of breath.   albuterol  108 (90 Base) MCG/ACT inhaler Commonly known as: VENTOLIN  HFA Inhale 2 puffs into the lungs every 6 (six) hours as needed for shortness of breath.   amLODipine  10 MG tablet Commonly known as: NORVASC  Take 1  tablet (10 mg total) by mouth daily.   apixaban  2.5 MG Tabs tablet Commonly known as: ELIQUIS  Take 1 tablet (2.5 mg total) by mouth 2 (two) times daily.   carvedilol  25 MG tablet Commonly known as: COREG  Take 1 tablet (25 mg total) by mouth 2 (two) times daily with a meal.   cinacalcet  60 MG tablet Commonly known as: SENSIPAR  Take 60 mg by  mouth every dialysis (Tuesday, Thursday, and Saturday).   cloNIDine  0.2 mg/24hr patch Commonly known as: CATAPRES  - Dosed in mg/24 hr Place 0.2 mg onto the skin once a week.   hydrALAZINE  100 MG tablet Commonly known as: APRESOLINE  Take 1 tablet (100 mg total) by mouth every 8 (eight) hours.   isosorbide  mononitrate 30 MG 24 hr tablet Commonly known as: IMDUR  TAKE 1 TABLET (30MG ) ON DIALYSIS DAYS AND 2 TABLETS (60 MG) ON NON DIALYSIS DAYS.   Lokelma  10 g Pack packet Generic drug: sodium zirconium cyclosilicate  Take 10 g by mouth every Monday, Wednesday, and Friday.   naloxone  4 MG/0.1ML Liqd nasal spray kit Commonly known as: NARCAN  Place 1 spray into the nose as needed (opioid overdose).   Omron 3 Series BP Monitor Devi Use as directed.   oxyCODONE -acetaminophen  10-325 MG tablet Commonly known as: PERCOCET Take 1 tablet by mouth every 8 (eight) hours as needed for pain.   pantoprazole  40 MG tablet Commonly known as: PROTONIX  TAKE 1 TABLET (40 MG TOTAL) BY MOUTH 2 (TWO) TIMES DAILY.   rosuvastatin  10 MG tablet Commonly known as: CRESTOR  TAKE 1 TABLET (10 MG TOTAL) BY MOUTH DAILY FOR CHOLESTEROL   sevelamer  carbonate 2.4 g Pack Commonly known as: RENVELA  Take 2.4 g by mouth daily.   traZODone  50 MG tablet Commonly known as: DESYREL  Take 25-50 mg by mouth at bedtime as needed for sleep.   Vitamin D3 50 MCG (2000 UT) Tabs Take 1 tablet by mouth daily.               Durable Medical Equipment  (From admission, onward)           Start     Ordered   05/19/24 1011  For home use only DME oxygen   Once       Question Answer Comment  Length of Need Lifetime   Mode or (Route) Nasal cannula   Liters per Minute 2   Frequency Continuous (stationary and portable oxygen  unit needed)   Oxygen  delivery system: Gas      05/19/24 1010             Consultations: Nephrology   Procedures/Studies:  DG Chest Portable 1 View Result Date: 05/18/2024 EXAM: 1  VIEW(S) XRAY OF THE CHEST 05/18/2024 07:33:00 AM COMPARISON: 02/14/2024 CLINICAL HISTORY: sob FINDINGS: LINES, TUBES AND DEVICES: Right chest tunneled dialysis catheter in place with tip terminating over the right atrium. Surgical clip in right chest. LUNGS AND PLEURA: Small pleural effusions. There are hazy and reticular opacities present diffusely, particularly on the right, suggesting alveolar and interstitial edema. No pneumothorax. HEART AND MEDIASTINUM: Unchanged cardiomegaly. Calcified aorta. BONES AND SOFT TISSUES: Median sternotomy wires noted. No acute osseous abnormality. IMPRESSION: 1. Hazy and reticular opacities, particularly on the right, suggesting alveolar and interstitial edema. 2. Small pleural effusions. Electronically signed by: Evalene Coho MD 05/18/2024 08:10 AM EST RP Workstation: [id number]     Subjective: - no chest pain, shortness of breath, no abdominal pain, nausea or vomiting.   Discharge Exam: BP (!) 167/107 (BP Location: Left Arm)  Pulse 84   Temp 98.4 F (36.9 C) (Oral)   Resp 19   Ht 5' 3 (1.6 m)   Wt 48.4 kg   SpO2 100%   BMI 18.90 kg/m  General: Pt is alert, awake, not in acute distress Cardiovascular: RRR, S1/S2 +, no rubs, no gallops Respiratory: CTA bilaterally, no wheezing, no rhonchi Abdominal: Soft, NT, ND, bowel sounds + Extremities: no edema, no cyanosis    The results of significant diagnostics from this hospitalization (including imaging, microbiology, ancillary and laboratory) are listed below for reference.     Microbiology: Recent Results (from the past 240 hours)  Resp panel by RT-PCR (RSV, Flu A&B, Covid) Anterior Nasal Swab     Status: None   Collection Time: 05/18/24  7:12 AM   Specimen: Anterior Nasal Swab  Result Value Ref Range Status   SARS Coronavirus 2 by RT PCR NEGATIVE NEGATIVE Final   Influenza A by PCR NEGATIVE NEGATIVE Final   Influenza B by PCR NEGATIVE NEGATIVE Final    Comment: (NOTE) The Xpert Xpress  SARS-CoV-2/FLU/RSV plus assay is intended as an aid in the diagnosis of influenza from Nasopharyngeal swab specimens and should not be used as a sole basis for treatment. Nasal washings and aspirates are unacceptable for Xpert Xpress SARS-CoV-2/FLU/RSV testing.  Fact Sheet for Patients: bloggercourse.com  Fact Sheet for Healthcare Providers: seriousbroker.it  This test is not yet approved or cleared by the [place]  FDA and has been authorized for detection and/or diagnosis of SARS-CoV-2 by FDA under an Emergency Use Authorization (EUA). This EUA will remain in effect (meaning this test can be used) for the duration of the COVID-19 declaration under Section 564(b)(1) of the Act, 21 U.S.C. section 360bbb-3(b)(1), unless the authorization is terminated or revoked.     Resp Syncytial Virus by PCR NEGATIVE NEGATIVE Final    Comment: (NOTE) Fact Sheet for Patients: bloggercourse.com  Fact Sheet for Healthcare Providers: seriousbroker.it  This test is not yet approved or cleared by the [place]  FDA and has been authorized for detection and/or diagnosis of SARS-CoV-2 by FDA under an Emergency Use Authorization (EUA). This EUA will remain in effect (meaning this test can be used) for the duration of the COVID-19 declaration under Section 564(b)(1) of the Act, 21 U.S.C. section 360bbb-3(b)(1), unless the authorization is terminated or revoked.  Performed at [facility], [address] [place], [place] [address]      Labs: Basic Metabolic Panel: Recent Labs  Lab 05/18/24 0717 05/18/24 0854 05/18/24 1012 05/19/24 0310  NA 140 139  --  135  K 5.1 5.0  --  4.8  CL 99  --   --  95*  CO2 24  --   --  28  GLUCOSE 110*  --   --  102*  BUN 49*  --   --  25*  CREATININE 9.11*  --  9.34* 5.58*  CALCIUM  9.7  --   --  9.2  MG  --   --  2.2  --    Liver Function  Tests: Recent Labs  Lab 05/18/24 0717  AST 21  ALT <5  ALKPHOS 88  BILITOT 0.5  PROT 8.0  ALBUMIN  4.7   CBC: Recent Labs  Lab 05/18/24 0717 05/18/24 0854 05/18/24 1030 05/19/24 0310  WBC 7.5  --  6.6 3.7*  NEUTROABS 6.1  --   --   --   HGB 11.8* 13.3 10.5* 10.2*  HCT 38.5 39.0 33.4* 32.3*  MCV 95.3  --  94.4 92.3  PLT 245  --  211 216   CBG: No results for input(s): GLUCAP in the last 168 hours. Hgb A1c No results for input(s): HGBA1C in the last 72 hours. Lipid Profile No results for input(s): CHOL, HDL, LDLCALC, TRIG, CHOLHDL, LDLDIRECT in the last 72 hours. Thyroid  function studies No results for input(s): TSH, T4TOTAL, T3FREE, THYROIDAB in the last 72 hours.  Invalid input(s): FREET3 Urinalysis    Component Value Date/Time   COLORURINE AMBER (A) 12/27/2021 1840   APPEARANCEUR TURBID (A) 12/27/2021 1840   LABSPEC 1.013 12/27/2021 1840   PHURINE 8.0 12/27/2021 1840   GLUCOSEU NEGATIVE 12/27/2021 1840   GLUCOSEU NEG mg/dL 93/98/7988 8055   HGBUR MODERATE (A) 12/27/2021 1840   BILIRUBINUR NEGATIVE 12/27/2021 1840   BILIRUBINUR negative 05/04/2018 0910   KETONESUR NEGATIVE 12/27/2021 1840   PROTEINUR >=300 (A) 12/27/2021 1840   UROBILINOGEN 0.2 05/04/2018 0910   UROBILINOGEN 1 02/14/2014 0946   NITRITE NEGATIVE 12/27/2021 1840   LEUKOCYTESUR LARGE (A) 12/27/2021 1840    FURTHER DISCHARGE INSTRUCTIONS:   Get Medicines reviewed and adjusted: Please take all your medications with you for your next visit with your Primary MD   Laboratory/radiological data: Please request your Primary MD to go over all hospital tests and procedure/radiological results at the follow up, please ask your Primary MD to get all Hospital records sent to his/her office.   In some cases, they will be blood work, cultures and biopsy results pending at the time of your discharge. Please request that your primary care M.D. goes through all the records of your  hospital data and follows up on these results.   Also Note the following: If you experience worsening of your admission symptoms, develop shortness of breath, life threatening emergency, suicidal or homicidal thoughts you must seek medical attention immediately by calling 911 or calling your MD immediately  if symptoms less severe.   You must read complete instructions/literature along with all the possible adverse reactions/side effects for all the Medicines you take and that have been prescribed to you. Take any new Medicines after you have completely understood and accpet all the possible adverse reactions/side effects.    Do not drive when taking Pain medications or sleeping medications (Benzodaizepines)   Do not take more than prescribed Pain, Sleep and Anxiety Medications. It is not advisable to combine anxiety,sleep and pain medications without talking with your primary care practitioner   Special Instructions: If you have smoked or chewed Tobacco  in the last 2 yrs please stop smoking, stop any regular Alcohol   and or any Recreational drug use.   Wear Seat belts while driving.   Please note: You were cared for by a hospitalist during your hospital stay. Once you are discharged, your primary care physician will handle any further medical issues. Please note that NO REFILLS for any discharge medications will be authorized once you are discharged, as it is imperative that you return to your primary care physician (or establish a relationship with a primary care physician if you do not have one) for your post hospital discharge needs so that they can reassess your need for medications and monitor your lab values.  Time coordinating discharge: 35 minutes  SIGNED:  Nilda Fendt, MD, PhD 05/19/2024, 10:10 AM   "

## 2024-05-20 NOTE — TOC Transition Note (Signed)
 Transition of Care - Initial Contact after Hospitalization  Date of discharge: 05/19/2024  Date of contact: 05/20/2024  Method: Phone Spoke to: Patient's Sister  Patient's sister contacted to discuss transition of care from recent inpatient hospitalization. Patient was admitted to [facility] from 05/18/24-05/19/24  discharge diagnosis of bronchitis/COPD exacerbation.  The discharge medication list was reviewed. Patient's sister understands the changes and has no concerns.   Patient scheduled to return to outpatient HD today. Next HD on Monday 12/22 per the HD holiday schedule.  Charmaine Piety, NP

## 2024-05-27 ENCOUNTER — Emergency Department (HOSPITAL_COMMUNITY)

## 2024-05-27 ENCOUNTER — Encounter (HOSPITAL_COMMUNITY): Payer: Self-pay | Admitting: Emergency Medicine

## 2024-05-27 ENCOUNTER — Other Ambulatory Visit: Payer: Self-pay

## 2024-05-27 ENCOUNTER — Inpatient Hospital Stay (HOSPITAL_COMMUNITY)
Admission: EM | Admit: 2024-05-27 | Discharge: 2024-05-30 | DRG: 291 | Disposition: A | Discharge status: Home / Self Care | Attending: Internal Medicine | Admitting: Internal Medicine

## 2024-05-27 DIAGNOSIS — Z923 Personal history of irradiation: Secondary | ICD-10-CM

## 2024-05-27 DIAGNOSIS — J9621 Acute and chronic respiratory failure with hypoxia: Secondary | ICD-10-CM | POA: Diagnosis present

## 2024-05-27 DIAGNOSIS — Z992 Dependence on renal dialysis: Secondary | ICD-10-CM

## 2024-05-27 DIAGNOSIS — Z8709 Personal history of other diseases of the respiratory system: Secondary | ICD-10-CM

## 2024-05-27 DIAGNOSIS — F1411 Cocaine abuse, in remission: Secondary | ICD-10-CM | POA: Diagnosis present

## 2024-05-27 DIAGNOSIS — Z86000 Personal history of in-situ neoplasm of breast: Secondary | ICD-10-CM

## 2024-05-27 DIAGNOSIS — E785 Hyperlipidemia, unspecified: Secondary | ICD-10-CM | POA: Diagnosis present

## 2024-05-27 DIAGNOSIS — N186 End stage renal disease: Secondary | ICD-10-CM

## 2024-05-27 DIAGNOSIS — Z7901 Long term (current) use of anticoagulants: Secondary | ICD-10-CM

## 2024-05-27 DIAGNOSIS — K219 Gastro-esophageal reflux disease without esophagitis: Secondary | ICD-10-CM | POA: Diagnosis present

## 2024-05-27 DIAGNOSIS — G4700 Insomnia, unspecified: Secondary | ICD-10-CM | POA: Diagnosis present

## 2024-05-27 DIAGNOSIS — F17210 Nicotine dependence, cigarettes, uncomplicated: Secondary | ICD-10-CM | POA: Diagnosis present

## 2024-05-27 DIAGNOSIS — Z79899 Other long term (current) drug therapy: Secondary | ICD-10-CM

## 2024-05-27 DIAGNOSIS — Z8679 Personal history of other diseases of the circulatory system: Secondary | ICD-10-CM

## 2024-05-27 DIAGNOSIS — Z820 Family history of epilepsy and other diseases of the nervous system: Secondary | ICD-10-CM

## 2024-05-27 DIAGNOSIS — Z885 Allergy status to narcotic agent status: Secondary | ICD-10-CM

## 2024-05-27 DIAGNOSIS — I5023 Acute on chronic systolic (congestive) heart failure: Secondary | ICD-10-CM | POA: Diagnosis present

## 2024-05-27 DIAGNOSIS — N2581 Secondary hyperparathyroidism of renal origin: Secondary | ICD-10-CM | POA: Diagnosis present

## 2024-05-27 DIAGNOSIS — I160 Hypertensive urgency: Secondary | ICD-10-CM | POA: Diagnosis present

## 2024-05-27 DIAGNOSIS — E8770 Fluid overload, unspecified: Secondary | ICD-10-CM | POA: Diagnosis present

## 2024-05-27 DIAGNOSIS — Z8249 Family history of ischemic heart disease and other diseases of the circulatory system: Secondary | ICD-10-CM

## 2024-05-27 DIAGNOSIS — D631 Anemia in chronic kidney disease: Secondary | ICD-10-CM | POA: Diagnosis present

## 2024-05-27 DIAGNOSIS — Z8673 Personal history of transient ischemic attack (TIA), and cerebral infarction without residual deficits: Secondary | ICD-10-CM

## 2024-05-27 DIAGNOSIS — Z981 Arthrodesis status: Secondary | ICD-10-CM

## 2024-05-27 DIAGNOSIS — J449 Chronic obstructive pulmonary disease, unspecified: Secondary | ICD-10-CM | POA: Diagnosis present

## 2024-05-27 DIAGNOSIS — Z91013 Allergy to seafood: Secondary | ICD-10-CM

## 2024-05-27 DIAGNOSIS — F17200 Nicotine dependence, unspecified, uncomplicated: Secondary | ICD-10-CM | POA: Diagnosis present

## 2024-05-27 DIAGNOSIS — Z833 Family history of diabetes mellitus: Secondary | ICD-10-CM

## 2024-05-27 DIAGNOSIS — I480 Paroxysmal atrial fibrillation: Secondary | ICD-10-CM | POA: Diagnosis present

## 2024-05-27 DIAGNOSIS — E875 Hyperkalemia: Secondary | ICD-10-CM | POA: Diagnosis present

## 2024-05-27 DIAGNOSIS — Z91199 Patient's noncompliance with other medical treatment and regimen due to unspecified reason: Secondary | ICD-10-CM

## 2024-05-27 DIAGNOSIS — I5022 Chronic systolic (congestive) heart failure: Secondary | ICD-10-CM | POA: Diagnosis present

## 2024-05-27 DIAGNOSIS — Z800 Family history of malignant neoplasm of digestive organs: Secondary | ICD-10-CM

## 2024-05-27 DIAGNOSIS — Z9012 Acquired absence of left breast and nipple: Secondary | ICD-10-CM

## 2024-05-27 DIAGNOSIS — J9601 Acute respiratory failure with hypoxia: Secondary | ICD-10-CM | POA: Diagnosis present

## 2024-05-27 DIAGNOSIS — Z886 Allergy status to analgesic agent status: Secondary | ICD-10-CM

## 2024-05-27 DIAGNOSIS — E7800 Pure hypercholesterolemia, unspecified: Secondary | ICD-10-CM | POA: Diagnosis present

## 2024-05-27 DIAGNOSIS — J9611 Chronic respiratory failure with hypoxia: Secondary | ICD-10-CM

## 2024-05-27 DIAGNOSIS — J810 Acute pulmonary edema: Secondary | ICD-10-CM

## 2024-05-27 DIAGNOSIS — F1910 Other psychoactive substance abuse, uncomplicated: Secondary | ICD-10-CM | POA: Diagnosis present

## 2024-05-27 DIAGNOSIS — I132 Hypertensive heart and chronic kidney disease with heart failure and with stage 5 chronic kidney disease, or end stage renal disease: Principal | ICD-10-CM | POA: Diagnosis present

## 2024-05-27 DIAGNOSIS — Z716 Tobacco abuse counseling: Secondary | ICD-10-CM

## 2024-05-27 DIAGNOSIS — I700 Atherosclerosis of aorta: Secondary | ICD-10-CM | POA: Diagnosis present

## 2024-05-27 DIAGNOSIS — Z9981 Dependence on supplemental oxygen: Secondary | ICD-10-CM

## 2024-05-27 DIAGNOSIS — Z853 Personal history of malignant neoplasm of breast: Secondary | ICD-10-CM

## 2024-05-27 DIAGNOSIS — I10 Essential (primary) hypertension: Principal | ICD-10-CM | POA: Diagnosis present

## 2024-05-27 DIAGNOSIS — Z91040 Latex allergy status: Secondary | ICD-10-CM

## 2024-05-27 LAB — I-STAT CHEM 8, ED
BUN: 60 mg/dL — ABNORMAL HIGH (ref 8–23)
Calcium, Ion: 0.92 mmol/L — ABNORMAL LOW (ref 1.15–1.40)
Chloride: 103 mmol/L (ref 98–111)
Creatinine, Ser: 12.8 mg/dL — ABNORMAL HIGH (ref 0.44–1.00)
Glucose, Bld: 106 mg/dL — ABNORMAL HIGH (ref 70–99)
HCT: 34 % — ABNORMAL LOW (ref 36.0–46.0)
Hemoglobin: 11.6 g/dL — ABNORMAL LOW (ref 12.0–15.0)
Potassium: 5.7 mmol/L — ABNORMAL HIGH (ref 3.5–5.1)
Sodium: 139 mmol/L (ref 135–145)
TCO2: 26 mmol/L (ref 22–32)

## 2024-05-27 LAB — CBC
HCT: 33.6 % — ABNORMAL LOW (ref 36.0–46.0)
Hemoglobin: 10.1 g/dL — ABNORMAL LOW (ref 12.0–15.0)
MCH: 28.6 pg (ref 26.0–34.0)
MCHC: 30.1 g/dL (ref 30.0–36.0)
MCV: 95.2 fL (ref 80.0–100.0)
Platelets: 277 K/uL (ref 150–400)
RBC: 3.53 MIL/uL — ABNORMAL LOW (ref 3.87–5.11)
RDW: 16.7 % — ABNORMAL HIGH (ref 11.5–15.5)
WBC: 6.2 K/uL (ref 4.0–10.5)
nRBC: 0 % (ref 0.0–0.2)

## 2024-05-27 LAB — PHOSPHORUS: Phosphorus: 3.1 mg/dL (ref 2.5–4.6)

## 2024-05-27 LAB — BASIC METABOLIC PANEL WITH GFR
Anion gap: 19 — ABNORMAL HIGH (ref 5–15)
BUN: 62 mg/dL — ABNORMAL HIGH (ref 8–23)
CO2: 25 mmol/L (ref 22–32)
Calcium: 9.8 mg/dL (ref 8.9–10.3)
Chloride: 98 mmol/L (ref 98–111)
Creatinine, Ser: 11.6 mg/dL — ABNORMAL HIGH (ref 0.44–1.00)
GFR, Estimated: 3 mL/min — ABNORMAL LOW
Glucose, Bld: 111 mg/dL — ABNORMAL HIGH (ref 70–99)
Potassium: 6.1 mmol/L — ABNORMAL HIGH (ref 3.5–5.1)
Sodium: 141 mmol/L (ref 135–145)

## 2024-05-27 LAB — MAGNESIUM: Magnesium: 1.1 mg/dL — ABNORMAL LOW (ref 1.7–2.4)

## 2024-05-27 LAB — PRO BRAIN NATRIURETIC PEPTIDE: Pro Brain Natriuretic Peptide: >35000 pg/mL — ABNORMAL HIGH

## 2024-05-27 LAB — TROPONIN T, HIGH SENSITIVITY
Troponin T High Sensitivity: 128 ng/L (ref 0–19)
Troponin T High Sensitivity: 59 ng/L — ABNORMAL HIGH (ref 0–19)

## 2024-05-27 LAB — PROTIME-INR
INR: 1.3 — ABNORMAL HIGH (ref 0.8–1.2)
Prothrombin Time: 16.7 s — ABNORMAL HIGH (ref 11.4–15.2)

## 2024-05-27 MED ORDER — HYDROMORPHONE HCL 1 MG/ML IJ SOLN
1.0000 mg | Freq: Once | INTRAMUSCULAR | Status: AC
  Administered 2024-05-27: 1 mg via INTRAVENOUS
  Filled 2024-05-27: qty 1

## 2024-05-27 MED ORDER — SODIUM ZIRCONIUM CYCLOSILICATE 10 G PO PACK
10.0000 g | PACK | Freq: Once | ORAL | Status: AC
  Administered 2024-05-27: 10 g via ORAL
  Filled 2024-05-27: qty 1

## 2024-05-27 MED ORDER — TRAZODONE HCL 50 MG PO TABS
25.0000 mg | ORAL_TABLET | Freq: Every evening | ORAL | Status: DC | PRN
  Administered 2024-05-29: 25 mg via ORAL

## 2024-05-27 MED ORDER — CINACALCET HCL 30 MG PO TABS
60.0000 mg | ORAL_TABLET | ORAL | Status: DC | PRN
  Administered 2024-05-30: 60 mg via ORAL

## 2024-05-27 MED ORDER — BISACODYL 5 MG PO TBEC: 5.0000 mg | DELAYED_RELEASE_TABLET | Freq: Every day | ORAL | Status: DC | PRN

## 2024-05-27 MED ORDER — OXYCODONE HCL 5 MG PO TABS
5.0000 mg | ORAL_TABLET | ORAL | Status: DC | PRN
  Administered 2024-05-27 – 2024-05-29 (×8): 5 mg via ORAL
  Filled 2024-05-27 (×3): qty 1

## 2024-05-27 MED ORDER — HYDRALAZINE HCL 20 MG/ML IJ SOLN: 10.0000 mg | INTRAMUSCULAR | Status: DC | PRN

## 2024-05-27 MED ORDER — ROSUVASTATIN CALCIUM 5 MG PO TABS
10.0000 mg | ORAL_TABLET | Freq: Every day | ORAL | Status: DC
  Administered 2024-05-27 – 2024-05-30 (×4): 10 mg via ORAL
  Filled 2024-05-27 (×2): qty 2

## 2024-05-27 MED ORDER — FLEET ENEMA RE ENEM: 1.0000 | ENEMA | Freq: Once | RECTAL | Status: DC | PRN

## 2024-05-27 MED ORDER — HYDRALAZINE HCL 20 MG/ML IJ SOLN
20.0000 mg | Freq: Once | INTRAMUSCULAR | Status: AC
  Administered 2024-05-27: 20 mg via INTRAVENOUS
  Filled 2024-05-27: qty 1

## 2024-05-27 MED ORDER — VITAMIN D 25 MCG (1000 UNIT) PO TABS
1000.0000 [IU] | ORAL_TABLET | Freq: Every day | ORAL | Status: DC
  Administered 2024-05-27 – 2024-05-30 (×4): 1000 [IU] via ORAL
  Filled 2024-05-27 (×2): qty 1

## 2024-05-27 MED ORDER — ONDANSETRON HCL 4 MG/2ML IJ SOLN: 4.0000 mg | Freq: Four times a day (QID) | INTRAMUSCULAR | Status: DC | PRN

## 2024-05-27 MED ORDER — APIXABAN 2.5 MG PO TABS
2.5000 mg | ORAL_TABLET | Freq: Two times a day (BID) | ORAL | Status: DC
  Administered 2024-05-27 – 2024-05-30 (×7): 2.5 mg via ORAL
  Filled 2024-05-27 (×4): qty 1

## 2024-05-27 MED ORDER — SODIUM ZIRCONIUM CYCLOSILICATE 10 G PO PACK: 10.0000 g | PACK | ORAL | Status: DC

## 2024-05-27 MED ORDER — HYDRALAZINE HCL 50 MG PO TABS
100.0000 mg | ORAL_TABLET | Freq: Three times a day (TID) | ORAL | Status: DC
  Administered 2024-05-27 – 2024-05-30 (×9): 100 mg via ORAL
  Filled 2024-05-27 (×4): qty 2

## 2024-05-27 MED ORDER — HEPARIN SODIUM (PORCINE) 5000 UNIT/ML IJ SOLN: 5000.0000 [IU] | Freq: Three times a day (TID) | INTRAMUSCULAR | Status: DC

## 2024-05-27 MED ORDER — PANTOPRAZOLE SODIUM 40 MG PO TBEC
40.0000 mg | DELAYED_RELEASE_TABLET | Freq: Two times a day (BID) | ORAL | Status: DC
  Administered 2024-05-27 – 2024-05-30 (×7): 40 mg via ORAL
  Filled 2024-05-27 (×4): qty 1

## 2024-05-27 MED ORDER — IPRATROPIUM BROMIDE 0.02 % IN SOLN: 0.5000 mg | Freq: Four times a day (QID) | RESPIRATORY_TRACT | Status: DC | PRN

## 2024-05-27 MED ORDER — AMLODIPINE BESYLATE 10 MG PO TABS
10.0000 mg | ORAL_TABLET | Freq: Every day | ORAL | Status: DC
  Administered 2024-05-27: 10 mg via ORAL
  Filled 2024-05-27: qty 2

## 2024-05-27 MED ORDER — LABETALOL HCL 5 MG/ML IV SOLN: 10.0000 mg | INTRAVENOUS | Status: DC | PRN

## 2024-05-27 MED ORDER — CARVEDILOL 25 MG PO TABS: 25.0000 mg | ORAL_TABLET | Freq: Two times a day (BID) | ORAL | Status: DC

## 2024-05-27 MED ORDER — NITROGLYCERIN 2 % TD OINT: 1.0000 [in_us] | TOPICAL_OINTMENT | Freq: Once | TRANSDERMAL | Status: DC

## 2024-05-27 MED ORDER — HYDRALAZINE HCL 20 MG/ML IJ SOLN
10.0000 mg | Freq: Once | INTRAMUSCULAR | Status: AC
  Administered 2024-05-27: 10 mg via INTRAVENOUS
  Filled 2024-05-27: qty 1

## 2024-05-27 MED ORDER — SODIUM CHLORIDE 0.9% FLUSH
3.0000 mL | Freq: Two times a day (BID) | INTRAVENOUS | Status: DC
  Administered 2024-05-27 – 2024-05-30 (×7): 3 mL via INTRAVENOUS

## 2024-05-27 MED ORDER — ONDANSETRON HCL 4 MG PO TABS: 4.0000 mg | ORAL_TABLET | Freq: Four times a day (QID) | ORAL | Status: DC | PRN

## 2024-05-27 MED ORDER — CLONIDINE HCL 0.2 MG/24HR TD PTWK
0.2000 mg | MEDICATED_PATCH | TRANSDERMAL | Status: DC
  Administered 2024-05-27: 0.2 mg via TRANSDERMAL
  Filled 2024-05-27: qty 1

## 2024-05-27 MED ORDER — HEPARIN SODIUM (PORCINE) 1000 UNIT/ML IJ SOLN
INTRAMUSCULAR | Status: AC
  Filled 2024-05-27: qty 4

## 2024-05-27 MED ORDER — SENNOSIDES-DOCUSATE SODIUM 8.6-50 MG PO TABS: 1.0000 | ORAL_TABLET | Freq: Every evening | ORAL | Status: DC | PRN

## 2024-05-27 MED ORDER — SODIUM CHLORIDE 0.9% FLUSH
3.0000 mL | Freq: Two times a day (BID) | INTRAVENOUS | Status: DC
  Administered 2024-05-27 – 2024-05-29 (×5): 3 mL via INTRAVENOUS

## 2024-05-27 MED ORDER — SENNOSIDES-DOCUSATE SODIUM 8.6-50 MG PO TABS
1.0000 | ORAL_TABLET | Freq: Every day | ORAL | Status: DC
  Administered 2024-05-27 – 2024-05-28 (×2): 1 via ORAL
  Filled 2024-05-27 (×2): qty 1

## 2024-05-27 NOTE — ED Notes (Signed)
 Difficulty getting IV/lab work on patient. Phlebotomy asked to see patient for ordered labs. EDP notified.

## 2024-05-27 NOTE — ED Notes (Signed)
 Pt tx to dialysis by dialysis Valarie, RN

## 2024-05-27 NOTE — Assessment & Plan Note (Signed)
 Continue statins

## 2024-05-27 NOTE — Assessment & Plan Note (Addendum)
 Patient reports no use of cocaine  recently UDS has not been ordered by ED Patient is not making urine at this point

## 2024-05-27 NOTE — Assessment & Plan Note (Signed)
 As needed trazodone , and melatonin

## 2024-05-27 NOTE — Assessment & Plan Note (Signed)
" -   Volume overload, contributing to shortness of breath, respiratory distress - Needing hemodialysis today nephrology consulted - Continue with hemodialysis Tuesdays Thursdays and Saturdays "

## 2024-05-27 NOTE — ED Provider Notes (Signed)
 " [facility] Provider Note   CSN: [id number] Arrival date & time: 05/27/24  9079     Patient presents with: Shortness of Breath   CAHTERINE Henderson is a 73 y.o. female.   Patient is a 73 year old female who presents to the emergency department with chief complaint of increased shortness of breath.  She was recently discharged in the hospital secondary to fluid overload and possible new CHF.  She is chronically on oxygen  at home but notes that she has ran out and the next delivery is unclear.  Patient admits to associated chest pain as well.  She is due for dialysis today.  She denies any increased edema to lower extremities.  She denies any abdominal pain, nausea, vomiting, diarrhea.  She does arrive hypertensive and on oxygen .   Shortness of Breath      Prior to Admission medications  Medication Sig Start Date End Date Taking? Authorizing Provider  acetaminophen  (TYLENOL ) 325 MG tablet Take 650 mg by mouth every 6 (six) hours as needed for mild pain (pain score 1-3) or moderate pain (pain score 4-6).    [provider]  albuterol  (PROVENTIL ) (2.5 MG/3ML) 0.083% nebulizer solution Take 3 mLs (2.5 mg total) by nebulization every 6 (six) hours as needed for wheezing or shortness of breath. 08/23/23   Pokhrel, Vernal, MD  albuterol  (VENTOLIN  HFA) 108 (90 Base) MCG/ACT inhaler Inhale 2 puffs into the lungs every 6 (six) hours as needed for shortness of breath. 10/02/23   [provider]  amLODipine  (NORVASC ) 10 MG tablet Take 1 tablet (10 mg total) by mouth daily. 03/06/22   Setzer, Nena PARAS, PA-C  apixaban  (ELIQUIS ) 2.5 MG TABS tablet Take 1 tablet (2.5 mg total) by mouth 2 (two) times daily. 03/06/22   Setzer, Sandra J, PA-C  Blood Pressure Monitoring (OMRON 3 SERIES BP MONITOR) DEVI Use as directed. 03/29/24   Tolia, Sunit, DO  carvedilol  (COREG ) 25 MG tablet Take 1 tablet (25 mg total) by mouth 2 (two) times daily with a meal.  05/01/22 06/16/24  Ghimire, Donalda HERO, MD  Cholecalciferol  (VITAMIN D3) 50 MCG (2000 UT) TABS Take 1 tablet by mouth daily. 04/21/24   [provider]  cinacalcet  (SENSIPAR ) 60 MG tablet Take 60 mg by mouth every dialysis (Tuesday, Thursday, and Saturday).    [provider]  cloNIDine  (CATAPRES  - DOSED IN MG/24 HR) 0.2 mg/24hr patch Place 0.2 mg onto the skin once a week. 02/25/24   [provider]  hydrALAZINE  (APRESOLINE ) 100 MG tablet Take 1 tablet (100 mg total) by mouth every 8 (eight) hours. 12/18/22   Dann Candyce RAMAN, MD  isosorbide  mononitrate (IMDUR ) 30 MG 24 hr tablet TAKE 1 TABLET (30MG ) ON DIALYSIS DAYS AND 2 TABLETS (60 MG) ON NON DIALYSIS DAYS. 12/22/23   Michele, Sunit, DO  LOKELMA  10 g PACK packet Take 10 g by mouth every Monday, Wednesday, and Friday. 07/30/23   [provider]  naloxone  (NARCAN ) nasal spray 4 mg/0.1 mL Place 1 spray into the nose as needed (opioid overdose). 02/19/22   [provider]  oxyCODONE -acetaminophen  (PERCOCET) 10-325 MG tablet Take 1 tablet by mouth every 8 (eight) hours as needed for pain.    [provider]  pantoprazole  (PROTONIX ) 40 MG tablet TAKE 1 TABLET (40 MG TOTAL) BY MOUTH 2 (TWO) TIMES DAILY. 05/13/23   Tanda Bleacher, MD  rosuvastatin  (CRESTOR ) 10 MG tablet TAKE 1 TABLET (10 MG TOTAL) BY MOUTH DAILY FOR CHOLESTEROL  08/07/22   Tanda Bleacher, MD  sevelamer  carbonate (RENVELA ) 2.4 g PACK Take 2.4 g by mouth daily.    [provider]  traZODone  (DESYREL ) 50 MG tablet Take 25-50 mg by mouth at bedtime as needed for sleep. 03/19/23   [provider]    Allergies: Shrimp [shellfish allergy], Bactroban  [mupirocin ], Hydrocodone , Chlorhexidine  gluconate, Latex, Tylenol  [acetaminophen ], and Zestril  [lisinopril ]    Review of Systems  Respiratory:  Positive for shortness of breath.   All other systems reviewed and are negative.   Updated Vital Signs BP (!) 168/128 (BP Location: Right  Arm)   Pulse 87   Temp 98 F (36.7 C) (Oral)   Resp (!) 25   SpO2 98%   Physical Exam Vitals and nursing note reviewed.  Constitutional:      General: She is not in acute distress.    Appearance: Normal appearance. She is not ill-appearing.  HENT:     Head: Normocephalic and atraumatic.     Nose: Nose normal.     Mouth/Throat:     Mouth: Mucous membranes are moist.  Eyes:     Extraocular Movements: Extraocular movements intact.     Conjunctiva/sclera: Conjunctivae normal.     Pupils: Pupils are equal, round, and reactive to light.  Cardiovascular:     Rate and Rhythm: Normal rate and regular rhythm.     Pulses: Normal pulses.     Heart sounds: Normal heart sounds. No murmur heard.    No gallop.  Pulmonary:     Effort: Pulmonary effort is normal. Tachypnea present.     Breath sounds: Rales present. No decreased breath sounds, wheezing or rhonchi.  Chest:     Chest wall: No tenderness.  Abdominal:     General: Abdomen is flat. Bowel sounds are normal.     Palpations: Abdomen is soft.     Tenderness: There is no abdominal tenderness. There is no guarding.  Musculoskeletal:        General: Normal range of motion.     Cervical back: Normal range of motion and neck supple.     Right lower leg: No edema.     Left lower leg: No edema.  Skin:    General: Skin is warm and dry.     Findings: No rash.  Neurological:     General: No focal deficit present.     Mental Status: She is alert and oriented to person, place, and time. Mental status is at baseline.     Cranial Nerves: No cranial nerve deficit.     Motor: No weakness.  Psychiatric:        Mood and Affect: Mood normal.        Behavior: Behavior normal.        Thought Content: Thought content normal.        Judgment: Judgment normal.     (all labs ordered are listed, but only abnormal results are displayed) Labs Reviewed  BASIC METABOLIC PANEL WITH GFR  CBC  PRO BRAIN NATRIURETIC PEPTIDE  I-STAT CHEM 8, ED   TROPONIN T, HIGH SENSITIVITY    EKG: None  Radiology: No results found.   Procedures   Medications Ordered in the ED  nitroGLYCERIN  (NITROGLYN) 2 % ointment 1 inch (has no administration in time range)                                    Medical Decision  Making Amount and/or Complexity of Data Reviewed Labs: ordered. Radiology: ordered.  Risk Prescription drug management. Decision regarding hospitalization.   This patient presents to the ED for concern of dyspnea, hypertension, this involves an extensive number of treatment options, and is a complaint that carries with it a high risk of complications and morbidity.  The differential diagnosis includes CHF, fluid overload, pulmonary edema, COPD, ACS, pulmonary embolus, pericarditis, myocarditis, endocarditis, electrolyte derangement   Co morbidities that complicate the patient evaluation  COPD, CVA, atrial fibrillation, CHF, chronic kidney disease on dialysis   Additional history obtained:  Additional history obtained from medical records External records from outside source obtained and reviewed including medical records   Lab Tests:  I Ordered, and personally interpreted labs.  The pertinent results include: No leukocytosis, anemia at baseline, hyperkalemia, elevated creatinine, elevated troponin, elevated BNP   Imaging Studies ordered:  I ordered imaging studies including chest x-ray I independently visualized and interpreted imaging which showed pulmonary edema I agree with the radiologist interpretation   Cardiac Monitoring: / EKG:  The patient was maintained on a cardiac monitor.  I personally viewed and interpreted the cardiac monitored which showed an underlying rhythm of: Normal sinus rhythm, no ST/T wave changes, no ischemic changes, no STEMI   Consultations Obtained:  I requested consultation with the nephrology,  and discussed lab and imaging findings as well as pertinent plan - they  recommend: Will dialyze   Problem List / ED Course / Critical interventions / Medication management  Patient does remain stable at this time.  We currently have her home on her home oxygen  and she is maintaining her oxygen  saturation without difficulty.  We are continuing to work on her blood pressure management in the emergency department.  Chest x-ray is consistent with pulmonary edema.  Did discuss patient case with nephrologist on-call who will plan for dialysis today.  Did discuss patient case with Dr. Willette with the hospitalist service who has excepted for admission.  Do not suspect acute ACS at this time.  Patient has chronically elevated troponins and EKG with no acute ischemic changes at this point.  Did start treatment for her hyperkalemia but again does need dialysis. I ordered medication including Lokelma , Dilaudid , hydralazine  for hypertension, chest pain Reevaluation of the patient after these medicines showed that the patient improved I have reviewed the patients home medicines and have made adjustments as needed   Social Determinants of Health:  None   Test / Admission - Considered:  Admission     Final diagnoses:  None    ED Discharge Orders     None          Daralene Lonni JONETTA DEVONNA 05/27/24 1232    Tegeler, Lonni PARAS, MD 05/27/24 1606    Tegeler, Lonni PARAS, MD 06/08/24 1150  "

## 2024-05-27 NOTE — Assessment & Plan Note (Signed)
 History of COPD with chronic respiratory failure, O2 dependent -Apparently ran out of oxygen  -Continue supplemental oxygen , maintaining O2 sat 88-92% -DuoNeb as needed, reviewing inhalers

## 2024-05-27 NOTE — Assessment & Plan Note (Signed)
-   Volume overload - Volume management with hemodialysis -

## 2024-05-27 NOTE — ED Notes (Signed)
 ED Provider at bedside.

## 2024-05-27 NOTE — ED Notes (Signed)
 Accepting floor states they are ready for patient.

## 2024-05-27 NOTE — Procedures (Signed)
 HD Note:  Some information was entered later than the data was gathered due to patient care needs. The stated time with the data is accurate.  Received patient in bed to unit. Patient complained of chest pain   She moaned continuously.  Alert and oriented.   Informed consent signed and in chart.   Access used: upper right chest HD catheter Access issues: None   She was quiet for a couple hours during treatment and the moaning started again.  Still indicating it was her chest and she responded that it remained muscular.   TX duration: 3.5 hours  Alert, without acute distress.  Total UF removed: 3000 ml  Hand-off given to patient's nurse.   Transported back to the room   Asser Lucena L. Lenon, RN [facility].

## 2024-05-27 NOTE — Assessment & Plan Note (Signed)
 On hemodialysis T- T- S - Nephrologist consulted by EDP, planning for hemodialysis today

## 2024-05-27 NOTE — Assessment & Plan Note (Signed)
"   Continue PPI  "

## 2024-05-27 NOTE — Assessment & Plan Note (Signed)
 Continue Coreg and Eliquis

## 2024-05-27 NOTE — Assessment & Plan Note (Signed)
 History of polysubstance abuse including cocaine  -Patient denying recent use -Urine drug screen was not ordered in the ED

## 2024-05-27 NOTE — Assessment & Plan Note (Signed)
 Patient advised to be compliant with current medication and hemodialysis

## 2024-05-27 NOTE — Assessment & Plan Note (Signed)
 History of hypertension, noncompliant with current medications Will resume meds accordingly

## 2024-05-27 NOTE — Assessment & Plan Note (Signed)
 Likely due to volume overload, with history of COPD, chronic respiratory failure O2 dependent  - Apparently ran out of oxygen  -Continue oxygen  supplement, maintaining O2 sat greater 90% -Patient is due for hemodialysis today will pursue with HD -As needed DuoNebs

## 2024-05-27 NOTE — Consult Note (Signed)
 [facility] Renal Consultation Note    Indication for Consultation:  Management of ESRD/hemodialysis; anemia, hypertension/volume and secondary hyperparathyroidism   HPI: Monique Henderson is a 73 y.o. female with ESRD on HD, HTN, HLD, PAFib, prior CVA with hemiparesis, COPD on chronic O2 who is admitted with hypoxic respiratory failure. She was recently discharged from [facility] on 12/19 with dyspnea. She was sent home with oxygen . Per notes she ran out of oxygen  at home and had not had any tanks delivered.   CXR showing pulmonary edema.  Labs notable for Na 141, K 6.1, BUN 62, Pro BNP > 35K   She is hypertensive and tachycardic on arrival. On 4L [place]. Oriented x 2. Denies chest pain initially then says she is having chest pain.   Dialysis TTS at [facility]. Due for dialysis today. Last dialysis Wed for 2:50 mins. Left 5kg over dry weight.   Past Medical History:  Diagnosis Date   Acute cardioembolic stroke (HCC) 02/26/2022   AF (paroxysmal atrial fibrillation) (HCC) 05/29/2019   on Coumadin    Aortic atherosclerosis 07/05/2019   Aortic dissection (HCC) 04/04/2019   s/p repair   Bone spur 2008   Right calcaneal foot spur   Breast cancer (HCC) 2004   Ductal carcinoma in situ of the left breast; S/P left partial mastectomy 02/26/2003; S/P re-excision of cranial and lateral margins11/18/2004.radiation   Carotid artery disease    Cerebral thrombosis with cerebral infarction 05/22/2019   Chronic HFrEF (heart failure with reduced ejection fraction) (HCC)    Chronic low back pain 06/22/2016   Chronic obstructive lung disease (HCC) 01/16/2017   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 Deward Null; S/P re-excision of superior and inferior margins 10/27/2012.    Dissection of aorta (HCC) 04/03/2019   ESRD on hemodialysis (HCC) 05/29/2019   Essential hypertension 09/16/2006   GERD 09/16/2006   Hepatitis C    treated and RNA confirmed  not detectable 01/2017   Insomnia 03/14/2015   Malnutrition of moderate degree 05/19/2019   Non compliance with medical treatment 12/04/2017   Normocytic anemia    With thrombocytosis   Osteoarthritis    Right ureteral stone 2002   S/P lumbar spinal fusion 01/18/2014   S/P lumbar decompressive laminectomy, fusion, and plating for lumbar spinal stenosis on 05/27/2009 by Dr. Alm CANDIE Molt.  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 Alm CANDIE Molt.     Stroke (cerebrum) (HCC)    Stroke (HCC) 02/23/2022   SVT (supraventricular tachycardia) 08/28/2019   Tobacco use disorder 04/19/2009   Uterine fibroid    Wears dentures    top   Past Surgical History:  Procedure Laterality Date   ANTERIOR LAT LUMBAR FUSION N/A 01/18/2014   Procedure: ANTERIOR LATERAL LUMBAR FUSION LUMBAR TWO-THREE;  Surgeon: Alm GORMAN Molt, MD;  Location: [facility];  Service: Neurosurgery;  Laterality: N/A;   ANTERIOR LUMBAR FUSION  01/18/2014   AV FISTULA PLACEMENT Left 04/20/2019   Procedure: ARTERIOVENOUS (AV) FISTULA CREATION;  Surgeon: Sheree Penne Bruckner, MD;  Location: [facility] OR;  Service: Vascular;  Laterality: Left;   BACK SURGERY     BREAST LUMPECTOMY Left 01/2003   BREAST LUMPECTOMY Right 2014   BREAST LUMPECTOMY WITH NEEDLE LOCALIZATION AND AXILLARY SENTINEL LYMPH NODE BX Right 10/13/2012   Procedure:  BREAST LUMPECTOMY WITH NEEDLE LOCALIZATION;  Surgeon: Deward GORMAN Curvin DOUGLAS, MD;  Location: [facility];  Service: General;  Laterality: Right;  Right breast wire localized lumpectomy   DIALYSIS/PERMA CATHETER INSERTION N/A 05/28/2023   Procedure: DIALYSIS/PERMA CATHETER INSERTION;  Surgeon: Melia Lynwood ORN, MD;  Location: [facility] INVASIVE CV LAB;  Service: Cardiovascular;  Laterality: N/A;   INSERTION OF DIALYSIS  CATHETER Right 04/20/2019   Procedure: INSERTION OF DIALYSIS CATHETER, right internal jugular;  Surgeon: Sheree Penne Bruckner, MD;  Location: [facility] OR;  Service: Vascular;  Laterality: Right;   INSERTION OF DIALYSIS CATHETER Right 09/24/2020   Procedure: INSERTION OF TUNNELED DIALYSIS CATHETER;  Surgeon: Oris Krystal FALCON, MD;  Location: [facility] OR;  Service: Vascular;  Laterality: Right;   IR FLUORO GUIDE CV LINE RIGHT  09/22/2020   IR THORACENTESIS ASP PLEURAL SPACE W/IMG GUIDE  05/19/2019   IR US  GUIDE VASC ACCESS LEFT  09/22/2020   IR US  GUIDE VASC ACCESS RIGHT  09/22/2020   IR VENOCAVAGRAM SVC  09/22/2020   LAMINECTOMY  05/27/2009   Lumbar decompressive laminectomy, fusion and plating for lumbar spinal stensosis   LIGATION OF ARTERIOVENOUS  FISTULA Left 09/24/2020   Procedure: LIGATION OF LEFT ARM ARTERIOVENOUS  FISTULA;  Surgeon: Oris Krystal FALCON, MD;  Location: [facility] OR;  Service: Vascular;  Laterality: Left;   LUMBAR LAMINECTOMY/DECOMPRESSION MICRODISCECTOMY Left 03/23/2013   Procedure: LUMBAR LAMINECTOMY/DECOMPRESSION MICRODISCECTOMY 1 LEVEL;  Surgeon: Alm GORMAN Molt, MD;  Location: [facility];  Service: Neurosurgery;  Laterality: Left;  LUMBAR LAMINECTOMY/DECOMPRESSION MICRODISCECTOMY 1 LEVEL   MASTECTOMY, PARTIAL Left 02/26/2003   ; S/P re-excision of cranial and lateral margins 04/19/2003.    RE-EXCISION OF BREAST CANCER,SUPERIOR MARGINS Right 10/27/2012   Procedure: RE-EXCISION OF BREAST CANCER,SUPERIOR and inferior MARGINS;  Surgeon: Deward GORMAN Curvin DOUGLAS, MD;  Location: [facility] OR;  Service: General;  Laterality: Right;   RE-EXCISION OF BREAST LUMPECTOMY Left 04/2003   TEE WITHOUT CARDIOVERSION N/A 04/04/2019   Procedure: Transesophageal Echocardiogram (Tee);  Surgeon: German Bartlett PEDLAR, MD;  Location: [facility] OR;  Service: Open Heart Surgery;  Laterality: N/A;   THORACIC AORTIC ANEURYSM REPAIR N/A 04/04/2019   Procedure: THORACIC ASCENDING ANEURYSM REPAIR (AAA)  USING 28 MM X 30 CM HEMASHIELD PLATINUM VASCULAR  GRAFT;  Surgeon: German Bartlett PEDLAR, MD;  Location: [facility] OR;  Service: Open Heart Surgery;  Laterality: N/A;   Family History  Problem Relation Age of Onset   Colon cancer Mother 63   Hypertension Mother    Diabetes Sister 54   Hypertension Sister    Diabetes Brother    Hypertension Brother    Diabetes Brother    Hypertension Brother    Kidney disease Son        s/p renal transplant   Hypertension Son    Diabetes Son    Multiple sclerosis Son    Bone cancer Sister 64   Breast cancer Neg Hx    Cervical cancer Neg Hx    Social History:  reports that she quit smoking about 3 years ago. Her smoking use included cigarettes. She started smoking about 48 years ago. She has a 11 pack-year smoking history. She has never used smokeless tobacco. She reports that she does not currently use alcohol  after a past usage of about 2.0 standard drinks of alcohol  per week. She reports that she does not currently use drugs after having used the following drugs: Cocaine . Allergies[1] Prior to Admission medications  Medication Sig Start Date End Date Taking? Authorizing Provider  acetaminophen  (TYLENOL ) 325 MG tablet Take 650 mg by mouth every 6 (six) hours as needed for mild pain (pain score 1-3) or moderate pain (pain score 4-6).    [provider]  albuterol  (PROVENTIL ) (2.5 MG/3ML) 0.083% nebulizer solution Take 3 mLs (2.5 mg total) by nebulization every 6 (six) hours as needed for wheezing or shortness of breath. 08/23/23   Pokhrel, Vernal, MD  albuterol  (VENTOLIN  HFA) 108 (90 Base) MCG/ACT inhaler Inhale 2 puffs into the lungs every 6 (six) hours as needed for shortness of breath. 10/02/23   [provider]  amLODipine  (NORVASC ) 10 MG tablet Take 1 tablet (10 mg total) by mouth daily. 03/06/22   Setzer, Sandra J, PA-C  apixaban  (ELIQUIS ) 2.5 MG TABS tablet Take 1 tablet (2.5 mg total) by mouth 2 (two) times daily. 03/06/22   Setzer, Sandra J, PA-C  Blood Pressure Monitoring (OMRON 3 SERIES BP  MONITOR) DEVI Use as directed. 03/29/24   Tolia, Sunit, DO  carvedilol  (COREG ) 25 MG tablet Take 1 tablet (25 mg total) by mouth 2 (two) times daily with a meal. 05/01/22 06/16/24  Ghimire, Donalda HERO, MD  Cholecalciferol  (VITAMIN D3) 50 MCG (2000 UT) TABS Take 1 tablet by mouth daily. 04/21/24   [provider]  cinacalcet  (SENSIPAR ) 60 MG tablet Take 60 mg by mouth every dialysis (Tuesday, Thursday, and Saturday).    [provider]  cloNIDine  (CATAPRES  - DOSED IN MG/24 HR) 0.2 mg/24hr patch Place 0.2 mg onto the skin once a week. 02/25/24   [provider]  hydrALAZINE  (APRESOLINE ) 100 MG tablet Take 1 tablet (100 mg total) by mouth every 8 (eight) hours. 12/18/22   Dann Candyce RAMAN, MD  isosorbide  mononitrate (IMDUR ) 30 MG 24 hr tablet TAKE 1 TABLET (30MG ) ON DIALYSIS DAYS AND 2 TABLETS (60 MG) ON NON DIALYSIS DAYS. 12/22/23   Tolia, Sunit, DO  LOKELMA  10 g PACK packet Take 10 g by mouth every Monday, Wednesday, and Friday. 07/30/23   [provider]  naloxone  (NARCAN ) nasal spray 4 mg/0.1 mL Place 1 spray into the nose as needed (opioid overdose). 02/19/22   [provider]  oxyCODONE -acetaminophen  (PERCOCET) 10-325 MG tablet Take 1 tablet by mouth every 8 (eight) hours as needed for pain.    [provider]  pantoprazole  (PROTONIX ) 40 MG tablet TAKE 1 TABLET (40 MG TOTAL) BY MOUTH 2 (TWO) TIMES DAILY. 05/13/23   Tanda Bleacher, MD  rosuvastatin  (CRESTOR ) 10 MG tablet TAKE 1 TABLET (10 MG TOTAL) BY MOUTH DAILY FOR CHOLESTEROL 08/07/22   Tanda Bleacher, MD  sevelamer  carbonate (RENVELA ) 2.4 g PACK Take 2.4 g by mouth daily.    [provider]  traZODone  (DESYREL ) 50 MG tablet Take 25-50 mg by mouth at bedtime as needed for sleep. 03/19/23   [provider]   Current Facility-Administered Medications  Medication Dose Route Frequency Provider Last Rate Last Admin   amLODipine  (NORVASC ) tablet 10 mg  10 mg Oral Daily Shahmehdi, Seyed  A, MD       apixaban  (ELIQUIS ) tablet 2.5 mg  2.5 mg Oral BID Shahmehdi, Seyed A, MD       carvedilol  (COREG ) tablet 25 mg  25 mg Oral BID WC Shahmehdi, Seyed A, MD       cholecalciferol  (VITAMIN D3) 25 MCG (1000 UNIT) tablet 1,000 Units  1,000 Units Oral Daily Shahmehdi, Seyed A, MD       cinacalcet  (SENSIPAR ) tablet 60 mg  60 mg Oral Q dialysis Shahmehdi, Seyed A, MD  cloNIDine  (CATAPRES  - Dosed in mg/24 hr) patch 0.2 mg  0.2 mg Transdermal Weekly Shahmehdi, Seyed A, MD       hydrALAZINE  (APRESOLINE ) injection 10 mg  10 mg Intravenous Q4H PRN Shahmehdi, Seyed A, MD       hydrALAZINE  (APRESOLINE ) tablet 100 mg  100 mg Oral Q8H Shahmehdi, Seyed A, MD       ipratropium (ATROVENT ) nebulizer solution 0.5 mg  0.5 mg Nebulization Q6H PRN Shahmehdi, Seyed A, MD       labetalol  (NORMODYNE ) injection 10 mg  10 mg Intravenous Q2H PRN Shahmehdi, Seyed A, MD       nitroGLYCERIN  (NITROGLYN) 2 % ointment 1 inch  1 inch Topical Once Rigney, Christopher D, PA-C       ondansetron  (ZOFRAN ) tablet 4 mg  4 mg Oral Q6H PRN Shahmehdi, Seyed A, MD       Or   ondansetron  (ZOFRAN ) injection 4 mg  4 mg Intravenous Q6H PRN Shahmehdi, Seyed A, MD       oxyCODONE  (Oxy IR/ROXICODONE ) immediate release tablet 5 mg  5 mg Oral Q4H PRN Shahmehdi, Seyed A, MD       pantoprazole  (PROTONIX ) EC tablet 40 mg  40 mg Oral BID Shahmehdi, Seyed A, MD       rosuvastatin  (CRESTOR ) tablet 10 mg  10 mg Oral Daily Shahmehdi, Seyed A, MD       senna-docusate (Senokot-S) tablet 1 tablet  1 tablet Oral QHS Shahmehdi, Seyed A, MD       sodium chloride  flush (NS) 0.9 % injection 3 mL  3 mL Intravenous Q12H Shahmehdi, Seyed A, MD   3 mL at 05/27/24 1251   sodium chloride  flush (NS) 0.9 % injection 3 mL  3 mL Intravenous Q12H Shahmehdi, Seyed A, MD   3 mL at 05/27/24 1250   [START ON 05/29/2024] sodium zirconium cyclosilicate  (LOKELMA ) packet 10 g  10 g Oral Q M,W,F Shahmehdi, Seyed A, MD       traZODone  (DESYREL ) tablet 25 mg  25 mg Oral QHS  PRN Shahmehdi, Adriana LABOR, MD       Current Outpatient Medications  Medication Sig Dispense Refill   acetaminophen  (TYLENOL ) 325 MG tablet Take 650 mg by mouth every 6 (six) hours as needed for mild pain (pain score 1-3) or moderate pain (pain score 4-6).     albuterol  (PROVENTIL ) (2.5 MG/3ML) 0.083% nebulizer solution Take 3 mLs (2.5 mg total) by nebulization every 6 (six) hours as needed for wheezing or shortness of breath. 75 mL 12   albuterol  (VENTOLIN  HFA) 108 (90 Base) MCG/ACT inhaler Inhale 2 puffs into the lungs every 6 (six) hours as needed for shortness of breath.     amLODipine  (NORVASC ) 10 MG tablet Take 1 tablet (10 mg total) by mouth daily. 30 tablet 0   apixaban  (ELIQUIS ) 2.5 MG TABS tablet Take 1 tablet (2.5 mg total) by mouth 2 (two) times daily. 60 tablet 0   Blood Pressure Monitoring (OMRON 3 SERIES BP MONITOR) DEVI Use as directed. 1 each 0   carvedilol  (COREG ) 25 MG tablet Take 1 tablet (25 mg total) by mouth 2 (two) times daily with a meal. 60 tablet 1   Cholecalciferol  (VITAMIN D3) 50 MCG (2000 UT) TABS Take 1 tablet by mouth daily.     cinacalcet  (SENSIPAR ) 60 MG tablet Take 60 mg by mouth every dialysis (Tuesday, Thursday, and Saturday).     cloNIDine  (CATAPRES  - DOSED IN MG/24 HR) 0.2 mg/24hr patch Place 0.2 mg  onto the skin once a week.     hydrALAZINE  (APRESOLINE ) 100 MG tablet Take 1 tablet (100 mg total) by mouth every 8 (eight) hours. 270 tablet 3   isosorbide  mononitrate (IMDUR ) 30 MG 24 hr tablet TAKE 1 TABLET (30MG ) ON DIALYSIS DAYS AND 2 TABLETS (60 MG) ON NON DIALYSIS DAYS. 135 tablet 3   LOKELMA  10 g PACK packet Take 10 g by mouth every Monday, Wednesday, and Friday.     naloxone  (NARCAN ) nasal spray 4 mg/0.1 mL Place 1 spray into the nose as needed (opioid overdose).     oxyCODONE -acetaminophen  (PERCOCET) 10-325 MG tablet Take 1 tablet by mouth every 8 (eight) hours as needed for pain.     pantoprazole  (PROTONIX ) 40 MG tablet TAKE 1 TABLET (40 MG TOTAL) BY MOUTH  2 (TWO) TIMES DAILY. 60 tablet 1   rosuvastatin  (CRESTOR ) 10 MG tablet TAKE 1 TABLET (10 MG TOTAL) BY MOUTH DAILY FOR CHOLESTEROL 90 tablet 0   sevelamer  carbonate (RENVELA ) 2.4 g PACK Take 2.4 g by mouth daily.     traZODone  (DESYREL ) 50 MG tablet Take 25-50 mg by mouth at bedtime as needed for sleep.       ROS: As per HPI otherwise negative.  Physical Exam: Vitals:   05/27/24 0933 05/27/24 1010 05/27/24 1100 05/27/24 1145  BP: (!) 168/128  (!) 201/122 (!) 199/109  Pulse: 87     Resp: (!) 24 (!) 25 (!) 30   Temp: 98 F (36.7 C)     TempSrc: Oral     SpO2: 98%  94%      General: Chronically ill appearing, on nasal oxygen   Head: NCAT sclera not icteric  CV: Tachy  Pulm: coarse breath sounds, increased wob  Abdomen: non-tender, no masses  Lower extremities: no edema  Neuro: A & O X 3.  Psych:  Responds to questions appropriately Dialysis Access: R chest [facility]   Labs: Basic Metabolic Panel: Recent Labs  Lab 05/27/24 1051 05/27/24 1055  NA 141 139  K 6.1* 5.7*  CL 98 103  CO2 25  --   GLUCOSE 111* 106*  BUN 62* 60*  CREATININE 11.60* 12.80*  CALCIUM  9.8  --    Liver Function Tests: No results for input(s): AST, ALT, ALKPHOS, BILITOT, PROT, ALBUMIN  in the last 168 hours. No results for input(s): LIPASE, AMYLASE in the last 168 hours. No results for input(s): AMMONIA in the last 168 hours. CBC: Recent Labs  Lab 05/27/24 1051 05/27/24 1055  WBC 6.2  --   HGB 10.1* 11.6*  HCT 33.6* 34.0*  MCV 95.2  --   PLT 277  --    Cardiac Enzymes: No results for input(s): CKTOTAL, CKMB, CKMBINDEX, TROPONINI in the last 168 hours. CBG: No results for input(s): GLUCAP in the last 168 hours. Iron Studies: No results for input(s): IRON, TIBC, TRANSFERRIN, FERRITIN in the last 72 hours. Studies/Results: DG Chest 2 View Result Date: 05/27/2024 EXAM: 2 VIEW(S) XRAY OF THE CHEST 05/27/2024 10:33:30 AM COMPARISON: 05/18/2024 CLINICAL HISTORY: cp  FINDINGS: LINES, TUBES AND DEVICES: Right IJ CVC in place with tip overlying lower right atrium. LUNGS AND PLEURA: Bilateral interstitial opacities are slightly decreased compared to 05/18/24. There are persistent right basilar opacities which may reflect atelectasis versus infection. Trace pleural effusions which are slightly decreased. No pneumothorax. HEART AND MEDIASTINUM: Cardiomegaly, unchanged. Aortic arch atherosclerosis. BONES AND SOFT TISSUES: Post-surgical changes overlying chest. Sternotomy wires noted. No acute osseous abnormality. IMPRESSION: 1. Bilateral interstitial opacities suggestive of pulmonary edema,  slightly decreased from prior. 2. Persistent right basilar opacities which may reflect atelectasis or infection. 3. Trace pleural effusions, slightly decreased. 4. Cardiomegaly, unchanged. 5. Right IJ CVC tip overlies the lower right atrium. Electronically signed by: Donnice Mania MD 05/27/2024 12:04 PM EST RP Workstation: [id number]    Dialysis Orders:  Unit: [facility] Schedule: TTS Time: 3:30  EDW: 46 kg  Flows: 400  Bath: 2K/2Ca Access: TDC Heparin : None  ESA: None VDRA: Hectorol  7 q HD, Cinaclcet 60 q HD   Assessment/Plan: Acute on chronic hypoxic respiratory failure 2/2 volume overload. Pulmonary edema on CXR. HD today for fluid removal ESRD.  HD TTS. HD today. Will likely need extra HD Monday.   Hyperkalemia. Received Lokelma . Correct with HD today.  Hypertension. Severe. Continue home meds and HD for volume lowering.  Volume. Volume overload as above. 5 kg over EDW on 12/24. Get weight down with HD Anemia. Hgb at goal. No ESA indications.  Metabolic bone disease.  Continue home meds as able. Follow Ca/Phos trends.  Nutrition. Renal diet with fluid restriction   Maisie Ronnald Acosta PA-C [facility] 05/27/2024, 1:05 PM           [1]  Allergies Allergen Reactions   Shrimp [Shellfish Allergy] Shortness Of Breath   Bactroban  [Mupirocin ] Other (See  Comments)    Sores in nose   Hydrocodone  Itching, Nausea And Vomiting and Nausea Only   Chlorhexidine  Gluconate Itching   Latex Itching    Latex gloves cause itchiness and a rash   Tylenol  [Acetaminophen ] Itching    Takes Percocet at home 05/06/23   Zestril  [Lisinopril ] Cough

## 2024-05-27 NOTE — Assessment & Plan Note (Signed)
 Continue Eliquis , statins

## 2024-05-27 NOTE — ED Notes (Signed)
 Patient transported to X-ray

## 2024-05-27 NOTE — H&P (Signed)
 " History and Physical   Patient: Monique Henderson                            PCP: Patient, No Pcp Per                    DOB: 1950/06/08            DOA: 05/27/2024 FMW:[id number]             DOS: 05/27/2024, 12:59 PM  Patient, No Pcp Per  Patient coming from:   HOME  I have personally reviewed patient's medical records, in electronic medical records, including:  [facility], and care everywhere.    Chief Complaint:   Chief Complaint  Patient presents with   Shortness of Breath    History of present illness:    Monique Henderson is a 73 y.o female with history of ESRD (TTS), HTN, HLD,CVA with right hemiparesis, P -A-fib on Eliquis , former smoker, COPD (on 2/3 L O2), chronic CHF... Presenting once again to the ED with chief complaint of shortness of breath, hypoxia, and hypertensive state. Chronically on O2 by nasal cannula, stating she ran out of oxygen  Denies of having any increased swelling, denies of any abdominal pain, nausea vomiting or diarrhea.  Patient was recently admitted and discharged 12/18 - 12/19 for the same, volume overload, nonadherence with medications.      Patient Denies having: Fever, Chills, Cough,  Abd pain, N/V/D, headache, dizziness, lightheadedness,  Dysuria, Joint pain, rash, open wounds  Review of Systems: As per HPI, otherwise 10 point review of systems were negative.   ----------------------------------------------------------------------------------------------------------------------  Allergies[1]  Home MEDs:  Prior to Admission medications  Medication Sig Start Date End Date Taking? Authorizing Provider  acetaminophen  (TYLENOL ) 325 MG tablet Take 650 mg by mouth every 6 (six) hours as needed for mild pain (pain score 1-3) or moderate pain (pain score 4-6).    [provider]  albuterol  (PROVENTIL ) (2.5 MG/3ML) 0.083% nebulizer solution Take 3 mLs (2.5 mg total) by nebulization every 6 (six) hours as needed for wheezing or  shortness of breath. 08/23/23   Pokhrel, Vernal, MD  albuterol  (VENTOLIN  HFA) 108 (90 Base) MCG/ACT inhaler Inhale 2 puffs into the lungs every 6 (six) hours as needed for shortness of breath. 10/02/23   [provider]  amLODipine  (NORVASC ) 10 MG tablet Take 1 tablet (10 mg total) by mouth daily. 03/06/22   Setzer, Sandra J, PA-C  apixaban  (ELIQUIS ) 2.5 MG TABS tablet Take 1 tablet (2.5 mg total) by mouth 2 (two) times daily. 03/06/22   Setzer, Sandra J, PA-C  Blood Pressure Monitoring (OMRON 3 SERIES BP MONITOR) DEVI Use as directed. 03/29/24   Tolia, Sunit, DO  carvedilol  (COREG ) 25 MG tablet Take 1 tablet (25 mg total) by mouth 2 (two) times daily with a meal. 05/01/22 06/16/24  Ghimire, Donalda HERO, MD  Cholecalciferol  (VITAMIN D3) 50 MCG (2000 UT) TABS Take 1 tablet by mouth daily. 04/21/24   [provider]  cinacalcet  (SENSIPAR ) 60 MG tablet Take 60 mg by mouth every dialysis (Tuesday, Thursday, and Saturday).    [provider]  cloNIDine  (CATAPRES  - DOSED IN MG/24 HR) 0.2 mg/24hr patch Place 0.2 mg onto the skin once a week. 02/25/24   [provider]  hydrALAZINE  (APRESOLINE ) 100 MG tablet Take 1 tablet (100 mg total) by mouth every 8 (eight) hours. 12/18/22   Dann Candyce RAMAN, MD  isosorbide  mononitrate (IMDUR ) 30 MG 24 hr tablet TAKE 1 TABLET (30MG ) ON DIALYSIS DAYS AND 2 TABLETS (60 MG) ON NON DIALYSIS DAYS. 12/22/23   Tolia, Sunit, DO  LOKELMA  10 g PACK packet Take 10 g by mouth every Monday, Wednesday, and Friday. 07/30/23   [provider]  naloxone  (NARCAN ) nasal spray 4 mg/0.1 mL Place 1 spray into the nose as needed (opioid overdose). 02/19/22   [provider]  oxyCODONE -acetaminophen  (PERCOCET) 10-325 MG tablet Take 1 tablet by mouth every 8 (eight) hours as needed for pain.    [provider]  pantoprazole  (PROTONIX ) 40 MG tablet TAKE 1 TABLET (40 MG TOTAL) BY MOUTH 2 (TWO) TIMES DAILY. 05/13/23   Tanda Bleacher, MD   rosuvastatin  (CRESTOR ) 10 MG tablet TAKE 1 TABLET (10 MG TOTAL) BY MOUTH DAILY FOR CHOLESTEROL 08/07/22   Tanda Bleacher, MD  sevelamer  carbonate (RENVELA ) 2.4 g PACK Take 2.4 g by mouth daily.    [provider]  traZODone  (DESYREL ) 50 MG tablet Take 25-50 mg by mouth at bedtime as needed for sleep. 03/19/23   [provider]    PRN MEDs: cinacalcet , hydrALAZINE , ipratropium, labetalol , ondansetron  **OR** ondansetron  (ZOFRAN ) IV, oxyCODONE , traZODone   Past Medical History:  Diagnosis Date   Acute cardioembolic stroke (HCC) 02/26/2022   AF (paroxysmal atrial fibrillation) (HCC) 05/29/2019   on Coumadin    Aortic atherosclerosis 07/05/2019   Aortic dissection (HCC) 04/04/2019   s/p repair   Bone spur 2008   Right calcaneal foot spur   Breast cancer (HCC) 2004   Ductal carcinoma in situ of the left breast; S/P left partial mastectomy 02/26/2003; S/P re-excision of cranial and lateral margins11/18/2004.radiation   Carotid artery disease    Cerebral thrombosis with cerebral infarction 05/22/2019   Chronic HFrEF (heart failure with reduced ejection fraction) (HCC)    Chronic low back pain 06/22/2016   Chronic obstructive lung disease (HCC) 01/16/2017   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 Deward Null; S/P re-excision of superior and inferior margins 10/27/2012.    Dissection of aorta (HCC) 04/03/2019   ESRD on hemodialysis (HCC) 05/29/2019   Essential hypertension 09/16/2006   GERD 09/16/2006   Hepatitis C    treated and RNA confirmed not detectable 01/2017   Insomnia 03/14/2015   Malnutrition of moderate degree 05/19/2019   Non compliance with medical treatment 12/04/2017   Normocytic anemia    With thrombocytosis   Osteoarthritis    Right ureteral stone 2002   S/P lumbar spinal fusion 01/18/2014   S/P lumbar decompressive laminectomy, fusion, and plating for lumbar spinal stenosis on 05/27/2009 by Dr.  Alm CANDIE Molt.  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 Alm CANDIE Molt.     Stroke (cerebrum) (HCC)    Stroke (HCC) 02/23/2022   SVT (supraventricular tachycardia) 08/28/2019   Tobacco use disorder 04/19/2009   Uterine fibroid    Wears dentures    top    Past Surgical History:  Procedure Laterality Date   ANTERIOR LAT LUMBAR FUSION N/A 01/18/2014   Procedure: ANTERIOR LATERAL LUMBAR FUSION LUMBAR TWO-THREE;  Surgeon: Alm GORMAN Molt, MD;  Location: [facility];  Service: Neurosurgery;  Laterality: N/A;   ANTERIOR LUMBAR FUSION  01/18/2014   AV FISTULA PLACEMENT Left 04/20/2019   Procedure: ARTERIOVENOUS (AV)  FISTULA CREATION;  Surgeon: Sheree Penne Bruckner, MD;  Location: [facility] OR;  Service: Vascular;  Laterality: Left;   BACK SURGERY     BREAST LUMPECTOMY Left 01/2003   BREAST LUMPECTOMY Right 2014   BREAST LUMPECTOMY WITH NEEDLE LOCALIZATION AND AXILLARY SENTINEL LYMPH NODE BX Right 10/13/2012   Procedure: BREAST LUMPECTOMY WITH NEEDLE LOCALIZATION;  Surgeon: Deward GORMAN Curvin DOUGLAS, MD;  Location: [facility];  Service: General;  Laterality: Right;  Right breast wire localized lumpectomy   DIALYSIS/PERMA CATHETER INSERTION N/A 05/28/2023   Procedure: DIALYSIS/PERMA CATHETER INSERTION;  Surgeon: Melia Lynwood ORN, MD;  Location: [facility];  Service: Cardiovascular;  Laterality: N/A;   INSERTION OF DIALYSIS CATHETER Right 04/20/2019   Procedure: INSERTION OF DIALYSIS CATHETER, right internal jugular;  Surgeon: Sheree Penne Bruckner, MD;  Location: [facility] OR;  Service: Vascular;  Laterality: Right;   INSERTION OF DIALYSIS CATHETER Right 09/24/2020   Procedure: INSERTION OF TUNNELED DIALYSIS CATHETER;  Surgeon: Oris Krystal FALCON, MD;  Location: [facility] OR;  Service: Vascular;  Laterality: Right;    IR FLUORO GUIDE CV LINE RIGHT  09/22/2020   IR THORACENTESIS ASP PLEURAL SPACE W/IMG GUIDE  05/19/2019   IR US  GUIDE VASC ACCESS LEFT  09/22/2020   IR US  GUIDE VASC ACCESS RIGHT  09/22/2020   IR VENOCAVAGRAM SVC  09/22/2020   LAMINECTOMY  05/27/2009   Lumbar decompressive laminectomy, fusion and plating for lumbar spinal stensosis   LIGATION OF ARTERIOVENOUS  FISTULA Left 09/24/2020   Procedure: LIGATION OF LEFT ARM ARTERIOVENOUS  FISTULA;  Surgeon: Oris Krystal FALCON, MD;  Location: [facility] OR;  Service: Vascular;  Laterality: Left;   LUMBAR LAMINECTOMY/DECOMPRESSION MICRODISCECTOMY Left 03/23/2013   Procedure: LUMBAR LAMINECTOMY/DECOMPRESSION MICRODISCECTOMY 1 LEVEL;  Surgeon: Alm GORMAN Molt, MD;  Location: [facility];  Service: Neurosurgery;  Laterality: Left;  LUMBAR LAMINECTOMY/DECOMPRESSION MICRODISCECTOMY 1 LEVEL   MASTECTOMY, PARTIAL Left 02/26/2003   ; S/P re-excision of cranial and lateral margins 04/19/2003.    RE-EXCISION OF BREAST CANCER,SUPERIOR MARGINS Right 10/27/2012   Procedure: RE-EXCISION OF BREAST CANCER,SUPERIOR and inferior MARGINS;  Surgeon: Deward GORMAN Curvin DOUGLAS, MD;  [facility];  Service: General;  Laterality: Right;   RE-EXCISION OF BREAST LUMPECTOMY Left 04/2003   TEE WITHOUT CARDIOVERSION N/A 04/04/2019   Procedure: Transesophageal Echocardiogram (Tee);  Surgeon: German Bartlett PEDLAR, MD;  Location: [facility] OR;  Service: Open Heart Surgery;  Laterality: N/A;   THORACIC AORTIC ANEURYSM REPAIR N/A 04/04/2019   Procedure: THORACIC ASCENDING ANEURYSM REPAIR (AAA)  USING 28 MM X 30 CM HEMASHIELD PLATINUM VASCULAR GRAFT;  Surgeon: German Bartlett PEDLAR, MD;  Location: [facility] OR;  Service: Open Heart Surgery;  Laterality: N/A;     reports that she quit smoking about 3 years ago. Her smoking use included cigarettes. She started smoking about 48 years ago. She has a 11 pack-year smoking history. She has never used smokeless tobacco. She reports that she does not currently use alcohol  after a past usage of  about 2.0 standard drinks of alcohol  per week. She reports that she does not currently use drugs after having used the following drugs: Cocaine .   Family History  Problem Relation Age of Onset   Colon cancer Mother 59   Hypertension Mother    Diabetes Sister 95   Hypertension Sister    Diabetes Brother    Hypertension Brother    Diabetes Brother    Hypertension Brother    Kidney disease Son        s/p renal  transplant   Hypertension Son    Diabetes Son    Multiple sclerosis Son    Bone cancer Sister 46   Breast cancer Neg Hx    Cervical cancer Neg Hx     Physical Exam:   Vitals:   05/27/24 0933 05/27/24 1010 05/27/24 1100 05/27/24 1145  BP: (!) 168/128  (!) 201/122 (!) 199/109  Pulse: 87     Resp: (!) 24 (!) 25 (!) 30   Temp: 98 F (36.7 C)     TempSrc: Oral     SpO2: 98%  94%    Constitutional: NAD, calm, comfortable Eyes: PERRL, lids and conjunctivae normal ENMT: Mucous membranes are moist. Posterior pharynx clear of any exudate or lesions.Normal dentition.  Neck: normal, supple, no masses, no thyromegaly Respiratory: Shortness of breath at rest, diffuse Rales, lower lobe crackles, no wheezing, Normal respiratory effort. No accessory muscle use.  Cardiovascular: Regular rate and rhythm, no murmurs / rubs / gallops. No extremity edema. 2+ pedal pulses. No carotid bruits.  Abdomen: no tenderness, no masses palpated. No hepatosplenomegaly. Bowel sounds positive.  Musculoskeletal: no clubbing / cyanosis. No joint deformity upper and lower extremities. Good ROM, no contractures. Normal muscle tone.  Neurologic: CN II-XII grossly intact. Sensation intact, DTR normal. Strength 5/5 in all 4.  Psychiatric: Normal judgment and insight. Alert and oriented x 3. Normal mood.  Skin: no rashes, lesions, ulcers. No induration       Labs on admission:    I have personally reviewed following labs and imaging studies  CBC: Recent Labs  Lab 05/27/24 1051 05/27/24 1055  WBC  6.2  --   HGB 10.1* 11.6*  HCT 33.6* 34.0*  MCV 95.2  --   PLT 277  --    Basic Metabolic Panel: Recent Labs  Lab 05/27/24 1051 05/27/24 1055  NA 141 139  K 6.1* 5.7*  CL 98 103  CO2 25  --   GLUCOSE 111* 106*  BUN 62* 60*  CREATININE 11.60* 12.80*  CALCIUM  9.8  --    GFR: Estimated Creatinine Clearance: 3.1 mL/min (A) (by C-G formula based on SCr of 12.8 mg/dL (H)). Liver Function Tests: No results for input(s): AST, ALT, ALKPHOS, BILITOT, PROT, ALBUMIN  in the last 168 hours. No results for input(s): LIPASE, AMYLASE in the last 168 hours. No results for input(s): AMMONIA in the last 168 hours. Coagulation Profile: No results for input(s): INR, PROTIME in the last 168 hours. Cardiac Enzymes: No results for input(s): CKTOTAL, CKMB, CKMBINDEX, TROPONINI in the last 168 hours. BNP (last 3 results) Recent Labs    05/27/24 1051  PROBNP >35,000.0*   HbA1C: No results for input(s): HGBA1C in the last 72 hours. CBG: No results for input(s): GLUCAP in the last 168 hours. Lipid Profile: No results for input(s): CHOL, HDL, LDLCALC, TRIG, CHOLHDL, LDLDIRECT in the last 72 hours. Thyroid  Function Tests: No results for input(s): TSH, T4TOTAL, FREET4, T3FREE, THYROIDAB in the last 72 hours. Anemia Panel: No results for input(s): VITAMINB12, FOLATE, FERRITIN, TIBC, IRON, RETICCTPCT in the last 72 hours. Urine analysis:    Component Value Date/Time   COLORURINE AMBER (A) 12/27/2021 1840   APPEARANCEUR TURBID (A) 12/27/2021 1840   LABSPEC 1.013 12/27/2021 1840   PHURINE 8.0 12/27/2021 1840   GLUCOSEU NEGATIVE 12/27/2021 1840   GLUCOSEU NEG mg/dL 93/98/7988 8055   HGBUR MODERATE (A) 12/27/2021 1840   BILIRUBINUR NEGATIVE 12/27/2021 1840   BILIRUBINUR negative 05/04/2018 0910   KETONESUR NEGATIVE 12/27/2021 1840   PROTEINUR >=  300 (A) 12/27/2021 1840   UROBILINOGEN 0.2 05/04/2018 0910   UROBILINOGEN 1  02/14/2014 0946   NITRITE NEGATIVE 12/27/2021 1840   LEUKOCYTESUR LARGE (A) 12/27/2021 1840    Last A1C:  Lab Results  Component Value Date   HGBA1C 4.1 (L) 09/01/2023     Radiologic Exams on Admission:   DG Chest 2 View Result Date: 05/27/2024 EXAM: 2 VIEW(S) XRAY OF THE CHEST 05/27/2024 10:33:30 AM COMPARISON: 05/18/2024 CLINICAL HISTORY: cp FINDINGS: LINES, TUBES AND DEVICES: Right IJ CVC in place with tip overlying lower right atrium. LUNGS AND PLEURA: Bilateral interstitial opacities are slightly decreased compared to 05/18/24. There are persistent right basilar opacities which may reflect atelectasis versus infection. Trace pleural effusions which are slightly decreased. No pneumothorax. HEART AND MEDIASTINUM: Cardiomegaly, unchanged. Aortic arch atherosclerosis. BONES AND SOFT TISSUES: Post-surgical changes overlying chest. Sternotomy wires noted. No acute osseous abnormality. IMPRESSION: 1. Bilateral interstitial opacities suggestive of pulmonary edema, slightly decreased from prior. 2. Persistent right basilar opacities which may reflect atelectasis or infection. 3. Trace pleural effusions, slightly decreased. 4. Cardiomegaly, unchanged. 5. Right IJ CVC tip overlies the lower right atrium. Electronically signed by: Donnice Mania MD 05/27/2024 12:04 PM EST RP Workstation: [id number]    EKG:   Independently reviewed.  Orders placed or performed during the hospital encounter of 05/27/24   EKG   EKG   EKG 12-Lead   *Note: Due to a large number of results and/or encounters for the requested time period, some results have not been displayed. A complete set of results can be found in Results Review.   ---------------------------------------------------------------------------------------------------------------------------------------    Assessment / Plan:   Principal Problem:   Acute hypoxic respiratory failure (HCC) Active Problems:   Fluid overload   GERD   Insomnia    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   Hyperkalemia   Hypertension   Pure hypercholesterolemia   Polysubstance abuse (HCC)   Chronic heart failure with mildly reduced ejection fraction (HFmrEF, 41-49%) (HCC)   ESRD (end stage renal disease) on dialysis (HCC)   Tobacco use disorder   Non compliance with medical treatment   History of CVA (cerebrovascular accident)   Paroxysmal atrial fibrillation (HCC)   History of cocaine  abuse (HCC)   Hypertensive urgency   HLD (hyperlipidemia)   History of COPD   Assessment and Plan: * Acute hypoxic respiratory failure (HCC) Likely due to volume overload, with history of COPD, chronic respiratory failure O2 dependent  - Apparently ran out of oxygen  -Continue oxygen  supplement, maintaining O2 sat greater 90% -Patient is due for hemodialysis today will pursue with HD -As needed DuoNebs  Fluid overload  - Volume overload, contributing to shortness of breath, respiratory distress - Needing hemodialysis today nephrology consulted - Continue with hemodialysis Tuesdays Thursdays and Saturdays  ESRD (end stage renal disease) on dialysis (HCC) On hemodialysis T- T- S - Nephrologist consulted by EDP, planning for hemodialysis today  Chronic heart failure with mildly reduced ejection fraction (HFmrEF, 41-49%) (HCC) - Volume overload - Volume management with hemodialysis -  Polysubstance abuse (HCC) History of polysubstance abuse including cocaine  -Patient denying recent use -Urine drug screen was not ordered in the ED  Pure hypercholesterolemia Continue statins  Hypertension History of hypertension, noncompliant with current medications Will resume meds accordingly  Hyperkalemia Serum potassium 6.1 >> 5.7 -10 mg of Lokelma  given in ED Patient going for hemodialysis -Home medications reviewed patient is on Lokelma  10 mg M - W -F Is not clear if patient has been taking  her Lokelma   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 Noted,  stable  Insomnia As needed trazodone , and melatonin  GERD Continue PPI  History of cocaine  abuse (HCC) Patient reports no use of cocaine  recently UDS has not been ordered by ED Patient is not making urine at this point  Paroxysmal atrial fibrillation (HCC) Continue Coreg  and Eliquis   History of CVA (cerebrovascular accident) Continue Eliquis , statins  Non compliance with medical treatment Patient advised to be compliant with current medication and hemodialysis  Tobacco use disorder Counseled regarding smoking cessation Patient stating she does not smoke anymore NicoDerm patch provided  Hypertensive urgency Blood pressure on arrival SBP > 200 Patient has been given 20 mg IV hydralazine , Review home medication: Which includes Norvasc , Coreg , clonidine , hydralazine ... Will continue home meds accordingly Along with as needed hydralazine  and labetalol   History of COPD History of COPD with chronic respiratory failure, O2 dependent -Apparently ran out of oxygen  -Continue supplemental oxygen , maintaining O2 sat 88-92% -DuoNeb as needed, reviewing inhalers  HLD (hyperlipidemia) Continue statins         Consults called: Nephrologist Causing TOC/social worker for home arrangement of oxygen  -------------------------------------------------------------------------------------------------------------------------------------------- DVT prophylaxis:  apixaban  (ELIQUIS ) tablet 2.5 mg Start: 05/27/24 1230 SCDs Start: 05/27/24 1220 apixaban  (ELIQUIS ) tablet 2.5 mg   Code Status:   Code Status: Full Code   Admission status: Patient will be admitted as Observation, with a greater than 2 midnight length of stay. Level of care: Telemetry   Family Communication:  none at bedside  (The above findings and plan of care has been discussed with patient in detail, the patient expressed understanding and agreement of above plan)   --------------------------------------------------------------------------------------------------------------------------------------------------  Disposition Plan:  Anticipated 1-2 days Status is: Observation The patient remains OBS appropriate and will d/c before 2 midnights.     ----------------------------------------------------------------------------------------------------------------------------------------------------  Time spent:  86  Min.  Was spent seeing and evaluating the patient, reviewing all medical records, drawn plan of care.  SIGNED: Adriana DELENA Grams, MD, FHM. FAAFP. [facility], Pager  (Please use [email or web address] to page/ or secure chat through epic) If 7PM-7AM, please contact night-coverage [email or web address],  05/27/2024, 12:59 PM     [1]  Allergies Allergen Reactions   Shrimp [Shellfish Allergy] Shortness Of Breath   Bactroban  [Mupirocin ] Other (See Comments)    Sores in nose   Hydrocodone  Itching, Nausea And Vomiting and Nausea Only   Chlorhexidine  Gluconate Itching   Latex Itching    Latex gloves cause itchiness and a rash   Tylenol  [Acetaminophen ] Itching    Takes Percocet at home 05/06/23   Zestril  [Lisinopril ] Cough   "

## 2024-05-27 NOTE — Assessment & Plan Note (Signed)
Noted, stable. ?

## 2024-05-27 NOTE — Assessment & Plan Note (Signed)
 Serum potassium 6.1 >> 5.7 -10 mg of Lokelma  given in ED Patient going for hemodialysis -Home medications reviewed patient is on Lokelma  10 mg M - W -F Is not clear if patient has been taking her Lokelma 

## 2024-05-27 NOTE — Hospital Course (Addendum)
 Monique Henderson is a 73 y.o female with history of ESRD (TTS), HTN, HLD,CVA with right hemiparesis, P -A-fib on Eliquis , former smoker, COPD (on 2/3 L O2), chronic CHF... Presenting once again to the ED with chief complaint of shortness of breath, hypoxia, and hypertensive state. Chronically on O2 by nasal cannula, stating she ran out of oxygen  Denies of having any increased swelling, denies of any abdominal pain, nausea vomiting or diarrhea.  Patient was recently admitted and discharged 12/18 - 12/19 for the same, volume overload, nonadherence with medications.

## 2024-05-27 NOTE — ED Triage Notes (Signed)
 PT BIB [facility] from home for SOB. Recently DC from here with maybe a new CHF dx.  On home 02 but ran out. No extra tanks, No home delivery set up that EMS could determine. CP with deep breath. Pt is T, TH, S dialysis pt, is current but due for it today.  Pt refused IV from EMS.   205/123, 78% RA, 95% 4L, HR 84, CBG 154  Hx of stroke.

## 2024-05-27 NOTE — Assessment & Plan Note (Signed)
 Blood pressure on arrival SBP > 200 Patient has been given 20 mg IV hydralazine , Review home medication: Which includes Norvasc , Coreg , clonidine , hydralazine ... Will continue home meds accordingly Along with as needed hydralazine  and labetalol 

## 2024-05-27 NOTE — Assessment & Plan Note (Addendum)
 Counseled regarding smoking cessation Patient stating she does not smoke anymore NicoDerm patch provided

## 2024-05-28 DIAGNOSIS — K219 Gastro-esophageal reflux disease without esophagitis: Secondary | ICD-10-CM | POA: Diagnosis present

## 2024-05-28 DIAGNOSIS — J449 Chronic obstructive pulmonary disease, unspecified: Secondary | ICD-10-CM | POA: Diagnosis present

## 2024-05-28 DIAGNOSIS — N2581 Secondary hyperparathyroidism of renal origin: Secondary | ICD-10-CM | POA: Diagnosis present

## 2024-05-28 DIAGNOSIS — J9621 Acute and chronic respiratory failure with hypoxia: Secondary | ICD-10-CM | POA: Diagnosis present

## 2024-05-28 DIAGNOSIS — E7800 Pure hypercholesterolemia, unspecified: Secondary | ICD-10-CM | POA: Diagnosis present

## 2024-05-28 DIAGNOSIS — I480 Paroxysmal atrial fibrillation: Secondary | ICD-10-CM | POA: Diagnosis present

## 2024-05-28 DIAGNOSIS — I700 Atherosclerosis of aorta: Secondary | ICD-10-CM | POA: Diagnosis present

## 2024-05-28 DIAGNOSIS — Z886 Allergy status to analgesic agent status: Secondary | ICD-10-CM | POA: Diagnosis not present

## 2024-05-28 DIAGNOSIS — Z992 Dependence on renal dialysis: Secondary | ICD-10-CM | POA: Diagnosis not present

## 2024-05-28 DIAGNOSIS — Z885 Allergy status to narcotic agent status: Secondary | ICD-10-CM | POA: Diagnosis not present

## 2024-05-28 DIAGNOSIS — I160 Hypertensive urgency: Secondary | ICD-10-CM | POA: Diagnosis present

## 2024-05-28 DIAGNOSIS — Z91013 Allergy to seafood: Secondary | ICD-10-CM | POA: Diagnosis not present

## 2024-05-28 DIAGNOSIS — E875 Hyperkalemia: Secondary | ICD-10-CM | POA: Diagnosis present

## 2024-05-28 DIAGNOSIS — J9601 Acute respiratory failure with hypoxia: Secondary | ICD-10-CM

## 2024-05-28 DIAGNOSIS — N186 End stage renal disease: Secondary | ICD-10-CM | POA: Diagnosis present

## 2024-05-28 DIAGNOSIS — Z8249 Family history of ischemic heart disease and other diseases of the circulatory system: Secondary | ICD-10-CM | POA: Diagnosis not present

## 2024-05-28 DIAGNOSIS — Z9981 Dependence on supplemental oxygen: Secondary | ICD-10-CM | POA: Diagnosis not present

## 2024-05-28 DIAGNOSIS — G4700 Insomnia, unspecified: Secondary | ICD-10-CM | POA: Diagnosis present

## 2024-05-28 DIAGNOSIS — I132 Hypertensive heart and chronic kidney disease with heart failure and with stage 5 chronic kidney disease, or end stage renal disease: Secondary | ICD-10-CM | POA: Diagnosis present

## 2024-05-28 DIAGNOSIS — I5023 Acute on chronic systolic (congestive) heart failure: Secondary | ICD-10-CM | POA: Diagnosis present

## 2024-05-28 DIAGNOSIS — D631 Anemia in chronic kidney disease: Secondary | ICD-10-CM | POA: Diagnosis present

## 2024-05-28 DIAGNOSIS — Z7901 Long term (current) use of anticoagulants: Secondary | ICD-10-CM | POA: Diagnosis not present

## 2024-05-28 DIAGNOSIS — Z833 Family history of diabetes mellitus: Secondary | ICD-10-CM | POA: Diagnosis not present

## 2024-05-28 DIAGNOSIS — Z91040 Latex allergy status: Secondary | ICD-10-CM | POA: Diagnosis not present

## 2024-05-28 LAB — CBC
HCT: 28.6 % — ABNORMAL LOW (ref 36.0–46.0)
Hemoglobin: 9.2 g/dL — ABNORMAL LOW (ref 12.0–15.0)
MCH: 29.6 pg (ref 26.0–34.0)
MCHC: 32.2 g/dL (ref 30.0–36.0)
MCV: 92 fL (ref 80.0–100.0)
Platelets: 235 K/uL (ref 150–400)
RBC: 3.11 MIL/uL — ABNORMAL LOW (ref 3.87–5.11)
RDW: 16.6 % — ABNORMAL HIGH (ref 11.5–15.5)
WBC: 4.4 K/uL (ref 4.0–10.5)
nRBC: 0 % (ref 0.0–0.2)

## 2024-05-28 LAB — HEPATITIS B SURFACE ANTIGEN: Hepatitis B Surface Ag: NONREACTIVE

## 2024-05-28 LAB — BASIC METABOLIC PANEL WITH GFR
Anion gap: 15 (ref 5–15)
BUN: 34 mg/dL — ABNORMAL HIGH (ref 8–23)
CO2: 27 mmol/L (ref 22–32)
Calcium: 9 mg/dL (ref 8.9–10.3)
Chloride: 94 mmol/L — ABNORMAL LOW (ref 98–111)
Creatinine, Ser: 7.05 mg/dL — ABNORMAL HIGH (ref 0.44–1.00)
GFR, Estimated: 6 mL/min — ABNORMAL LOW
Glucose, Bld: 86 mg/dL (ref 70–99)
Potassium: 4.7 mmol/L (ref 3.5–5.1)
Sodium: 136 mmol/L (ref 135–145)

## 2024-05-28 LAB — PHOSPHORUS: Phosphorus: 6.2 mg/dL — ABNORMAL HIGH (ref 2.5–4.6)

## 2024-05-28 LAB — MAGNESIUM: Magnesium: 2.1 mg/dL (ref 1.7–2.4)

## 2024-05-28 MED ORDER — CARVEDILOL 25 MG PO TABS
25.0000 mg | ORAL_TABLET | Freq: Two times a day (BID) | ORAL | Status: DC
  Administered 2024-05-28 – 2024-05-30 (×4): 25 mg via ORAL
  Filled 2024-05-28 (×2): qty 1

## 2024-05-28 MED ORDER — AMLODIPINE BESYLATE 10 MG PO TABS
10.0000 mg | ORAL_TABLET | Freq: Every day | ORAL | Status: DC
  Administered 2024-05-28 – 2024-05-30 (×3): 10 mg via ORAL
  Filled 2024-05-28 (×2): qty 1

## 2024-05-28 MED ORDER — MAGNESIUM SULFATE 2 GM/50ML IV SOLN
2.0000 g | Freq: Once | INTRAVENOUS | Status: AC
  Administered 2024-05-28: 2 g via INTRAVENOUS
  Filled 2024-05-28: qty 50

## 2024-05-28 NOTE — Progress Notes (Signed)
 "                                                                                                                                                                                                                                                                                PROGRESS NOTE     Patient Demographics:    Monique Henderson, is a 73 y.o. female, DOB - Jan 02, 1951, FMW:[id number]  Outpatient Primary MD for the patient is Patient, No Pcp Per    LOS - 0  Admit date - 05/27/2024    Chief Complaint  Patient presents with   Shortness of Breath       Brief Narrative (HPI from H&P)   73 y.o female with history of ESRD (TTS), HTN, HLD,CVA with right hemiparesis, P -A-fib on Eliquis , former smoker, COPD (on 2/3 L O2), chronic CHF. Presenting once again to the ED with chief complaint of shortness of breath, hypoxia, and hypertensive state.  In the ER she was diagnosed with fluid overload, acute hypoxic respiratory failure and admitted to the hospital   Subjective:    Monique Henderson today has, No headache, No chest pain, No abdominal pain - No Nausea, No new weakness tingling or numbness, mild shortness of breath   Assessment  & Plan :    Acute hypoxic respiratory failure (HCC) due to fluid overload, history of chronic hypoxic respiratory failure due to COPD with running out of home oxygen  supply.  No signs of COPD exacerbation or pneumonia, she had fluid overload upon exam on arrival along with her home oxygen  running out, after urgent HD and fluid removal she is improving currently on 2 L nasal cannula oxygen , next HD likely today or tomorrow.  Continue to monitor.   ESRD (end stage renal disease) on dialysis (HCC)  On hemodialysis T- T- S, nephrology monitoring will require extra HD run today or tomorrow.   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   Not on ACE/ARB or Entresto due to underlying   Pure hypercholesterolemia Continue statins   Hypertension  History of hypertension, noncompliant with current medications,  Will resume meds accordingly  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  Noted, stable   Insomnia As needed trazodone , and melatonin   GERD  Continue PPI   History of cocaine  abuse polysubstance abuse (HCC) Patient reports no use of cocaine  or recreational drugs recently counseled to continue to abstain.   Paroxysmal atrial fibrillation (HCC)  Continue Coreg  and Eliquis    History of CVA (cerebrovascular accident)  Continue Eliquis , statins   Non compliance with medical treatment Patient advised to be compliant with current medication and hemodialysis   Tobacco use disorder Counseled regarding smoking cessation, Nicoderm patch provided   Hypertensive urgency Poorly controlled, upon arrival blood pressure was very high likely due to respiratory distress, continue combination of Norvasc , Coreg , clonidine , hydralazine , monitor.  History of COPD History of COPD with chronic respiratory failure, O2 dependent, ran out of oxygen  at home, continue supportive care, no wheezing, will arrange for home oxygen  upon discharge.  HLD (hyperlipidemia) Continue statins      Condition -  Guarded  Family Communication  : None present  Code Status :  Full  Consults  :  Renal  PUD Prophylaxis :    Procedures  :            Disposition Plan  :    Status is: Observation  DVT Prophylaxis  :    apixaban  (ELIQUIS ) tablet 2.5 mg Start: 05/27/24 1230 SCDs Start: 05/27/24 1220 apixaban  (ELIQUIS ) tablet 2.5 mg    Lab Results  Component Value Date   PLT 235 05/28/2024    Diet :  Diet Order             Diet regular Room service appropriate? Yes; Fluid consistency: Thin  Diet effective now                    Inpatient Medications  Scheduled Meds:  amLODipine   10 mg Oral Daily   apixaban   2.5 mg Oral BID    carvedilol   25 mg Oral BID WC   cholecalciferol   1,000 Units Oral Daily   cloNIDine   0.2 mg Transdermal Weekly   hydrALAZINE   100 mg Oral Q8H   nitroGLYCERIN   1 inch Topical Once   pantoprazole   40 mg Oral BID   rosuvastatin   10 mg Oral Daily   senna-docusate  1 tablet Oral QHS   sodium chloride  flush  3 mL Intravenous Q12H   sodium chloride  flush  3 mL Intravenous Q12H   [START ON 05/29/2024] sodium zirconium cyclosilicate   10 g Oral Q M,W,F   Continuous Infusions: PRN Meds:.cinacalcet , hydrALAZINE , ipratropium, labetalol , ondansetron  **OR** ondansetron  (ZOFRAN ) IV, oxyCODONE , traZODone   Antibiotics  :    Anti-infectives (From admission, onward)    None         Objective:   Vitals:   05/28/24 0200 05/28/24 0400 05/28/24 0500 05/28/24 0542  BP:  (!) 163/95  (!) 163/95  Pulse: 89 86    Resp:  (!) 24    Temp:  (!) 97.5 F (36.4 C)    TempSrc:  Axillary    SpO2: 97% 98%    Weight:   47.9 kg   Height:        Wt Readings from Last 3 Encounters:  05/28/24 47.9 kg  05/19/24 48.4 kg  03/13/24 47.6 kg     Intake/Output Summary (Last 24 hours) at 05/28/2024 0819 Last data filed at 05/27/2024 1830 Gross per 24 hour  Intake --  Output 3000 ml  Net -3000 ml  Physical Exam  Awake Alert, No new F.N deficits, Normal affect [place].AT,PERRAL Supple Neck, No JVD,   Symmetrical Chest wall movement, Good air movement bilaterally, CTAB RRR,No Gallops,Rubs or new Murmurs,  +ve B.Sounds, Abd Soft, No tenderness,   No Cyanosis, Clubbing or edema       Data Review:    Recent Labs  Lab 05/27/24 1051 05/27/24 1055 05/28/24 0604  WBC 6.2  --  4.4  HGB 10.1* 11.6* 9.2*  HCT 33.6* 34.0* 28.6*  PLT 277  --  235  MCV 95.2  --  92.0  MCH 28.6  --  29.6  MCHC 30.1  --  32.2  RDW 16.7*  --  16.6*    Recent Labs  Lab 05/27/24 1051 05/27/24 1055 05/27/24 1255 05/28/24 0604  NA 141 139  --  136  K 6.1* 5.7*  --  4.7  CL 98 103  --  94*  CO2 25  --   --  27   ANIONGAP 19*  --   --  15  GLUCOSE 111* 106*  --  86  BUN 62* 60*  --  34*  CREATININE 11.60* 12.80*  --  7.05*  INR  --   --  1.3*  --   MG  --   --  1.1* 2.1  PHOS  --   --  3.1 6.2*  CALCIUM  9.8  --   --  9.0      Recent Labs  Lab 05/27/24 1051 05/27/24 1255 05/28/24 0604  INR  --  1.3*  --   MG  --  1.1* 2.1  CALCIUM  9.8  --  9.0    --------------------------------------------------------------------------------------------------------------- Lab Results  Component Value Date   CHOL 104 01/29/2023   HDL 49 01/29/2023   LDLCALC 46 01/29/2023   TRIG 46 01/29/2023   CHOLHDL 2.1 01/29/2023    Lab Results  Component Value Date   HGBA1C 4.1 (L) 09/01/2023      Micro Results No results found for this or any previous visit (from the past 240 hours).  Radiology Report DG Chest 2 View Result Date: 05/27/2024 EXAM: 2 VIEW(S) XRAY OF THE CHEST 05/27/2024 10:33:30 AM COMPARISON: 05/18/2024 CLINICAL HISTORY: cp FINDINGS: LINES, TUBES AND DEVICES: Right IJ CVC in place with tip overlying lower right atrium. LUNGS AND PLEURA: Bilateral interstitial opacities are slightly decreased compared to 05/18/24. There are persistent right basilar opacities which may reflect atelectasis versus infection. Trace pleural effusions which are slightly decreased. No pneumothorax. HEART AND MEDIASTINUM: Cardiomegaly, unchanged. Aortic arch atherosclerosis. BONES AND SOFT TISSUES: Post-surgical changes overlying chest. Sternotomy wires noted. No acute osseous abnormality. IMPRESSION: 1. Bilateral interstitial opacities suggestive of pulmonary edema, slightly decreased from prior. 2. Persistent right basilar opacities which may reflect atelectasis or infection. 3. Trace pleural effusions, slightly decreased. 4. Cardiomegaly, unchanged. 5. Right IJ CVC tip overlies the lower right atrium. Electronically signed by: Donnice Mania MD 05/27/2024 12:04 PM EST RP Workstation: [id number]     Signature  -    Lavada Stank M.D on 05/28/2024 at 8:19 AM   -  To page go to [email or web address]   "

## 2024-05-28 NOTE — Plan of Care (Signed)
   Problem: Education: Goal: Knowledge of General Education information will improve Description: Including pain rating scale, medication(s)/side effects and non-pharmacologic comfort measures Outcome: Progressing   Problem: Health Behavior/Discharge Planning: Goal: Ability to manage health-related needs will improve Outcome: Progressing   Problem: Clinical Measurements: Goal: Will remain free from infection Outcome: Progressing Goal: Diagnostic test results will improve Outcome: Progressing Goal: Respiratory complications will improve Outcome: Progressing Goal: Cardiovascular complication will be avoided Outcome: Progressing   Problem: Activity: Goal: Risk for activity intolerance will decrease Outcome: Progressing

## 2024-05-28 NOTE — Progress Notes (Signed)
 Monique Henderson is an 73 y.o. female with ESRD on HD, HTN, HLD, PAFib, prior CVA with hemiparesis, COPD on chronic O2 admitted with hypoxic respiratory failure, recently discharged from [facility] on 12/19 with dyspnea. She was sent home with oxygen  but ran out of oxygen  at home and had not had any tanks delivered.  CXR showing pulmonary edema.  Labs notable for Na 141, K 6.1, BUN 62, Pro BNP > 35K, hypertensive and tachycardic on arrival. On 4L [place]. Oriented x 2. Denies chest pain initially then says she is having chest pain. Last dialysis Wed for 2:50 mins. Left 5kg over dry weight.   Dialysis Orders:  Unit: AF Schedule: TTS Time: 3:30  EDW: 46 kg  Flows: 400  [place]: 2K/2Ca Access: TDC Heparin : None  ESA: None VDRA: Hectorol  7 q HD, Cinacalcet  60 q HD   Assessment/Plan: Acute on chronic hypoxic respiratory failure 2/2 volume overload. Pulmonary edema on CXR, tolerated HD on Sat with . HD today for fluid removal ESRD.  HD TTS. HD today. Will plan on extra HD Monday to get her closer to her EDW. She left 5kg above her EDW on 12/24.   Hyperkalemia. Received Lokelma . Correct with HD Sat..  Hypertension. Severe. Continue home meds and HD for volume lowering.  Volume. Volume overload as above. 5 kg over EDW on 12/24. Get weight down with HD Anemia. Hgb at goal. No ESA indications.  Metabolic bone disease.  Continue home meds as able. Follow Ca/Phos trends.  Nutrition. Renal diet with fluid restriction   Subjective: Feeling much better; no cramping on hd, denies dyspnea, even able to walk yest after hd without sob. On 3.5L O2 [place].   Chemistry and CBC: Creatinine  Date/Time Value Ref Range Status  09/28/2012 08:31 AM 1.2 (H) 0.6 - 1.1 mg/dL Final   Creat  Date/Time Value Ref Range Status  01/18/2017 11:45 AM 2.09 (H) 0.50 - 0.99 mg/dL Final    Comment:      For patients > or = 71 years of age: The upper reference limit for Creatinine is approximately 13% higher for people identified  as African-American.     12/09/2016 02:58 PM 2.23 (H) 0.50 - 0.99 mg/dL Final    Comment:      For patients > or = 71 years of age: The upper reference limit for Creatinine is approximately 13% higher for people identified as African-American.     02/14/2014 09:51 AM 1.24 (H) 0.50 - 1.10 mg/dL Final  96/69/7984 89:78 AM 1.52 (H) 0.50 - 1.10 mg/dL Final  90/75/7985 88:88 AM 1.33 (H) 0.50 - 1.10 mg/dL Final  92/76/7985 90:42 AM 1.38 (H) 0.50 - 1.10 mg/dL Final  94/85/7985 87:86 PM 2.44 (H) 0.50 - 1.10 mg/dL Final  88/79/7986 87:79 PM 1.66 (H) 0.50 - 1.10 mg/dL Final  90/73/7987 90:53 AM 1.43 (H) 0.50 - 1.10 mg/dL Final   Creatinine, Ser  Date/Time Value Ref Range Status  05/28/2024 06:04 AM 7.05 (H) 0.44 - 1.00 mg/dL Final  87/72/7974 89:44 AM 12.80 (H) 0.44 - 1.00 mg/dL Final  87/72/7974 89:48 AM 11.60 (H) 0.44 - 1.00 mg/dL Final  87/80/7974 96:89 AM 5.58 (H) 0.44 - 1.00 mg/dL Final  87/81/7974 89:87 AM 9.34 (H) 0.44 - 1.00 mg/dL Final  87/81/7974 92:82 AM 9.11 (H) 0.44 - 1.00 mg/dL Final  90/83/7974 95:59 AM 8.65 (H) 0.44 - 1.00 mg/dL Final  90/84/7974 89:79 AM 14.07 (H) 0.44 - 1.00 mg/dL Final  90/84/7974 96:52 AM 12.86 (H) 0.44 - 1.00 mg/dL  Final  10/30/2023 09:20 PM 9.21 (H) 0.44 - 1.00 mg/dL Final    Comment:    DIALYSIS  10/29/2023 04:22 AM 4.85 (H) 0.44 - 1.00 mg/dL Final  94/70/7974 91:79 AM 7.68 (H) 0.44 - 1.00 mg/dL Final  94/70/7974 93:79 AM 7.90 (H) 0.44 - 1.00 mg/dL Final  94/85/7974 92:81 AM 7.87 (H) 0.44 - 1.00 mg/dL Final  94/86/7974 87:93 AM 11.00 (H) 0.44 - 1.00 mg/dL Final  94/86/7974 87:99 AM 10.71 (H) 0.44 - 1.00 mg/dL Final  95/74/7974 92:46 AM 5.79 (H) 0.44 - 1.00 mg/dL Final  95/75/7974 94:84 AM 8.96 (H) 0.44 - 1.00 mg/dL Final  95/77/7974 93:78 PM 11.10 (H) 0.44 - 1.00 mg/dL Final  95/96/7974 97:77 AM 9.22 (H) 0.44 - 1.00 mg/dL Final  95/97/7974 97:57 AM 6.53 (H) 0.44 - 1.00 mg/dL Final    Comment:    DELTA CHECK NOTED  08/31/2023 08:11 AM 11.50  (H) 0.44 - 1.00 mg/dL Final  95/98/7974 91:99 AM 10.46 (H) 0.44 - 1.00 mg/dL Final  96/75/7974 95:99 AM 8.82 (H) 0.44 - 1.00 mg/dL Final    Comment:    DELTA CHECK NOTED  08/22/2023 12:14 AM 4.98 (H) 0.44 - 1.00 mg/dL Final  96/77/7974 97:40 PM 8.81 (H) 0.44 - 1.00 mg/dL Final  96/77/7974 91:76 AM 8.50 (H) 0.44 - 1.00 mg/dL Final  96/87/7974 97:74 AM 6.17 (H) 0.44 - 1.00 mg/dL Final  96/88/7974 94:93 AM 10.11 (H) 0.44 - 1.00 mg/dL Final  96/89/7974 90:98 PM 9.46 (H) 0.44 - 1.00 mg/dL Final  97/78/7974 95:81 AM 6.04 (H) 0.44 - 1.00 mg/dL Final    Comment:    DELTA CHECK NOTED  07/22/2023 03:53 PM 14.08 (H) 0.44 - 1.00 mg/dL Final  98/73/7974 96:59 AM 10.59 (H) 0.44 - 1.00 mg/dL Final  98/74/7974 89:61 AM 8.57 (H) 0.44 - 1.00 mg/dL Final  98/75/7974 97:69 PM 13.79 (H) 0.44 - 1.00 mg/dL Final  98/81/7974 92:48 AM 9.29 (H) 0.44 - 1.00 mg/dL Final    Comment:    DELTA CHECK NOTED  06/18/2023 03:34 AM 5.77 (H) 0.44 - 1.00 mg/dL Final  98/83/7974 88:65 AM 8.86 (H) 0.44 - 1.00 mg/dL Final  98/95/7974 91:99 AM 10.94 (H) 0.44 - 1.00 mg/dL Final  98/96/7974 94:50 AM 8.83 (H) 0.44 - 1.00 mg/dL Final  98/97/7974 91:83 AM 6.34 (H) 0.44 - 1.00 mg/dL Final  98/98/7974 88:67 AM 8.94 (H) 0.44 - 1.00 mg/dL Final  98/98/7974 87:65 AM 16.81 (H) 0.44 - 1.00 mg/dL Final  87/72/7975 91:43 PM 8.86 (H) 0.44 - 1.00 mg/dL Final  87/94/7975 94:46 AM 14.71 (H) 0.44 - 1.00 mg/dL Final  87/94/7975 96:67 AM 14.57 (H) 0.44 - 1.00 mg/dL Final  88/97/7975 94:47 PM 3.77 (H) 0.44 - 1.00 mg/dL Final  88/97/7975 93:41 AM 6.29 (H) 0.44 - 1.00 mg/dL Final  88/98/7975 94:43 AM 10.52 (H) 0.44 - 1.00 mg/dL Final  88/98/7975 97:91 AM 9.87 (H) 0.44 - 1.00 mg/dL Final  91/68/7975 92:65 AM 10.54 (H) 0.44 - 1.00 mg/dL Final  91/69/7975 96:82 AM 8.44 (H) 0.44 - 1.00 mg/dL Final   Recent Labs  Lab 05/27/24 1051 05/27/24 1055 05/27/24 1255 05/28/24 0604  NA 141 139  --  136  K 6.1* 5.7*  --  4.7  CL 98 103  --  94*  CO2  25  --   --  27  GLUCOSE 111* 106*  --  86  BUN 62* 60*  --  34*  CREATININE 11.60* 12.80*  --  7.05*  CALCIUM  9.8  --   --  9.0  PHOS  --   --  3.1 6.2*   Recent Labs  Lab 05/27/24 1051 05/27/24 1055 05/28/24 0604  WBC 6.2  --  4.4  HGB 10.1* 11.6* 9.2*  HCT 33.6* 34.0* 28.6*  MCV 95.2  --  92.0  PLT 277  --  235   Liver Function Tests: No results for input(s): AST, ALT, ALKPHOS, BILITOT, PROT, ALBUMIN  in the last 168 hours. No results for input(s): LIPASE, AMYLASE in the last 168 hours. No results for input(s): AMMONIA in the last 168 hours. Cardiac Enzymes: No results for input(s): CKTOTAL, CKMB, CKMBINDEX, TROPONINI in the last 168 hours. Iron Studies: No results for input(s): IRON, TIBC, TRANSFERRIN, FERRITIN in the last 72 hours. PT/INR: @LABRCNTIP (inr:5)  Xrays/Other Studies: ) Results for orders placed or performed during the hospital encounter of 05/27/24 (from the past 48 hours)  Basic metabolic panel     Status: Abnormal   Collection Time: 05/27/24 10:51 AM  Result Value Ref Range   Sodium 141 135 - 145 mmol/L   Potassium 6.1 (H) 3.5 - 5.1 mmol/L   Chloride 98 98 - 111 mmol/L   CO2 25 22 - 32 mmol/L   Glucose, Bld 111 (H) 70 - 99 mg/dL    Comment: Glucose reference range applies only to samples taken after fasting for at least 8 hours.   BUN 62 (H) 8 - 23 mg/dL   Creatinine, Ser 88.39 (H) 0.44 - 1.00 mg/dL   Calcium  9.8 8.9 - 10.3 mg/dL   GFR, Estimated 3 (L) >60 mL/min    Comment: (NOTE) Calculated using the CKD-EPI Creatinine Equation (2021)    Anion gap 19 (H) 5 - 15    Comment: Performed at [facility], [address], [place], [place] [address]  CBC     Status: Abnormal   Collection Time: 05/27/24 10:51 AM  Result Value Ref Range   WBC 6.2 4.0 - 10.5 K/uL   RBC 3.53 (L) 3.87 - 5.11 MIL/uL   Hemoglobin 10.1 (L) 12.0 - 15.0 g/dL   HCT 66.3 (L) 63.9 - 53.9 %   MCV 95.2 80.0 - 100.0 fL   MCH 28.6 26.0 - 34.0  pg   MCHC 30.1 30.0 - 36.0 g/dL   RDW 83.2 (H) 88.4 - 84.4 %   Platelets 277 150 - 400 K/uL   nRBC 0.0 0.0 - 0.2 %    Comment: Performed at [facility], [address] [place], [place] [address]  Troponin T, High Sensitivity     Status: Abnormal   Collection Time: 05/27/24 10:51 AM  Result Value Ref Range   Troponin T High Sensitivity 128 (HH) 0 - 19 ng/L    Comment: Critical value noted. Value is consistent with previously reported and called value  (NOTE) Biotin concentrations > 1000 ng/mL falsely decrease TnT results.  Serial cardiac troponin measurements are suggested.  Refer to the Links section for chest pain algorithms and additional  guidance. Performed at [facility], [address] [place], [place] [address]   Pro Brain natriuretic peptide     Status: Abnormal   Collection Time: 05/27/24 10:51 AM  Result Value Ref Range   Pro Brain Natriuretic Peptide >35,000.0 (H) <300.0 pg/mL    Comment: (NOTE) Age Group        Cut-Points    Interpretation  < 50 years     450 pg/mL       NT-proBNP > 450 pg/mL indicates  ADHF is likely              50 to 75 years  900 pg/mL      NT-proBNP > 900 pg/mL indicates          ADHF is likely  > 75 years      1800 pg/mL     NT-proBNP > 1800 pg/mL indicates          ADHF is likely                           All ages    Results between       Indeterminate. Further clinical             300 and the cut-   information is needed to determine            point for age group   if ADHF is present.                                                             Elecsys proBNP II/ Elecsys proBNP II STAT           Cut-Point                       Interpretation  300 pg/mL                    NT-proBNP <300pg/mL indicates                             ADHF is not likely  Performed at [facility], [address], [place], [place] [address]   I-stat chem 8, ED     Status: Abnormal   Collection Time:  05/27/24 10:55 AM  Result Value Ref Range   Sodium 139 135 - 145 mmol/L   Potassium 5.7 (H) 3.5 - 5.1 mmol/L   Chloride 103 98 - 111 mmol/L   BUN 60 (H) 8 - 23 mg/dL   Creatinine, Ser 87.19 (H) 0.44 - 1.00 mg/dL   Glucose, Bld 893 (H) 70 - 99 mg/dL    Comment: Glucose reference range applies only to samples taken after fasting for at least 8 hours.   Calcium , Ion 0.92 (L) 1.15 - 1.40 mmol/L   TCO2 26 22 - 32 mmol/L   Hemoglobin 11.6 (L) 12.0 - 15.0 g/dL   HCT 65.9 (L) 63.9 - 53.9 %  Troponin T, High Sensitivity     Status: Abnormal   Collection Time: 05/27/24 12:55 PM  Result Value Ref Range   Troponin T High Sensitivity 59 (H) 0 - 19 ng/L    Comment: (NOTE) Biotin concentrations > 1000 ng/mL falsely decrease TnT results.  Serial cardiac troponin measurements are suggested.  Refer to the Links section for chest pain algorithms and additional  guidance. Performed at [facility], [address] [place], [place] [address]   Magnesium      Status: Abnormal   Collection Time: 05/27/24 12:55 PM  Result Value Ref Range   Magnesium  1.1 (L) 1.7 - 2.4 mg/dL    Comment: Performed at [facility], [address], [place], [place] [address]  Phosphorus     Status: None   Collection Time: 05/27/24 12:55 PM  Result Value Ref Range   Phosphorus 3.1 2.5 - 4.6 mg/dL    Comment: Performed at [facility], [address], [place], [place] [address]  Protime-INR     Status: Abnormal   Collection Time: 05/27/24 12:55 PM  Result Value Ref Range   Prothrombin Time 16.7 (H) 11.4 - 15.2 seconds   INR 1.3 (H) 0.8 - 1.2    Comment: (NOTE) INR goal varies based on device and disease states. Performed at [facility], [address], [place], [place] [address]   Magnesium      Status: None   Collection Time: 05/28/24  6:04 AM  Result Value Ref Range   Magnesium  2.1 1.7 - 2.4 mg/dL    Comment: Performed at [facility], [address] [place], [place] [address]   Basic metabolic panel     Status: Abnormal   Collection Time: 05/28/24  6:04 AM  Result Value Ref Range   Sodium 136 135 - 145 mmol/L   Potassium 4.7 3.5 - 5.1 mmol/L    Comment: Delta check noted    Chloride 94 (L) 98 - 111 mmol/L   CO2 27 22 - 32 mmol/L   Glucose, Bld 86 70 - 99 mg/dL    Comment: Glucose reference range applies only to samples taken after fasting for at least 8 hours.   BUN 34 (H) 8 - 23 mg/dL   Creatinine, Ser 2.94 (H) 0.44 - 1.00 mg/dL   Calcium  9.0 8.9 - 10.3 mg/dL   GFR, Estimated 6 (L) >60 mL/min    Comment: (NOTE) Calculated using the CKD-EPI Creatinine Equation (2021)    Anion gap 15 5 - 15    Comment: Performed at [facility], [address], [place], [place] [address]  CBC     Status: Abnormal   Collection Time: 05/28/24  6:04 AM  Result Value Ref Range   WBC 4.4 4.0 - 10.5 K/uL   RBC 3.11 (L) 3.87 - 5.11 MIL/uL   Hemoglobin 9.2 (L) 12.0 - 15.0 g/dL   HCT 71.3 (L) 63.9 - 53.9 %   MCV 92.0 80.0 - 100.0 fL   MCH 29.6 26.0 - 34.0 pg   MCHC 32.2 30.0 - 36.0 g/dL   RDW 83.3 (H) 88.4 - 84.4 %   Platelets 235 150 - 400 K/uL   nRBC 0.0 0.0 - 0.2 %    Comment: Performed at [facility], [address] [place], [place] [address]  Phosphorus     Status: Abnormal   Collection Time: 05/28/24  6:04 AM  Result Value Ref Range   Phosphorus 6.2 (H) 2.5 - 4.6 mg/dL    Comment: Performed at [facility], [address], [place], [place] [address]   *Note: Due to a large number of results and/or encounters for the requested time period, some results have not been displayed. A complete set of results can be found in Results Review.   DG Chest 2 View Result Date: 05/27/2024 EXAM: 2 VIEW(S) XRAY OF THE CHEST 05/27/2024 10:33:30 AM COMPARISON: 05/18/2024 CLINICAL HISTORY: cp FINDINGS: LINES, TUBES AND DEVICES: Right IJ CVC in place with tip overlying lower right atrium. LUNGS AND PLEURA: Bilateral interstitial opacities are slightly decreased  compared to 05/18/24. There are persistent right basilar opacities which may reflect atelectasis versus infection. Trace pleural effusions which are slightly decreased. No pneumothorax. HEART  AND MEDIASTINUM: Cardiomegaly, unchanged. Aortic arch atherosclerosis. BONES AND SOFT TISSUES: Post-surgical changes overlying chest. Sternotomy wires noted. No acute osseous abnormality. IMPRESSION: 1. Bilateral interstitial opacities suggestive of pulmonary edema, slightly decreased from prior. 2. Persistent right basilar opacities which may reflect atelectasis or infection. 3. Trace pleural effusions, slightly decreased. 4. Cardiomegaly, unchanged. 5. Right IJ CVC tip overlies the lower right atrium. Electronically signed by: Donnice Mania MD 05/27/2024 12:04 PM EST RP Workstation: [id number]    PMH:   Past Medical History:  Diagnosis Date   Acute cardioembolic stroke (HCC) 02/26/2022   AF (paroxysmal atrial fibrillation) (HCC) 05/29/2019   on Coumadin    Aortic atherosclerosis 07/05/2019   Aortic dissection (HCC) 04/04/2019   s/p repair   Bone spur 2008   Right calcaneal foot spur   Breast cancer (HCC) 2004   Ductal carcinoma in situ of the left breast; S/P left partial mastectomy 02/26/2003; S/P re-excision of cranial and lateral margins11/18/2004.radiation   Carotid artery disease    Cerebral thrombosis with cerebral infarction 05/22/2019   Chronic HFrEF (heart failure with reduced ejection fraction) (HCC)    Chronic low back pain 06/22/2016   Chronic obstructive lung disease (HCC) 01/16/2017   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 Deward Null; S/P re-excision of superior and inferior margins 10/27/2012.    Dissection of aorta (HCC) 04/03/2019   ESRD on hemodialysis (HCC) 05/29/2019   Essential hypertension 09/16/2006   GERD 09/16/2006   Hepatitis C    treated and RNA confirmed not detectable 01/2017   Insomnia 03/14/2015   Malnutrition  of moderate degree 05/19/2019   Non compliance with medical treatment 12/04/2017   Normocytic anemia    With thrombocytosis   Osteoarthritis    Right ureteral stone 2002   S/P lumbar spinal fusion 01/18/2014   S/P lumbar decompressive laminectomy, fusion, and plating for lumbar spinal stenosis on 05/27/2009 by Dr. Alm CANDIE Molt.  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 Alm CANDIE Molt.     Stroke (cerebrum) (HCC)    Stroke (HCC) 02/23/2022   SVT (supraventricular tachycardia) 08/28/2019   Tobacco use disorder 04/19/2009   Uterine fibroid    Wears dentures    top    PSH:   Past Surgical History:  Procedure Laterality Date   ANTERIOR LAT LUMBAR FUSION N/A 01/18/2014   Procedure: ANTERIOR LATERAL LUMBAR FUSION LUMBAR TWO-THREE;  Surgeon: Alm GORMAN Molt, MD;  Location: [facility];  Service: Neurosurgery;  Laterality: N/A;   ANTERIOR LUMBAR FUSION  01/18/2014   AV FISTULA PLACEMENT Left 04/20/2019   Procedure: ARTERIOVENOUS (AV) FISTULA CREATION;  Surgeon: Sheree Penne Bruckner, MD;  Location: [facility] OR;  Service: Vascular;  Laterality: Left;   BACK SURGERY     BREAST LUMPECTOMY Left 01/2003   BREAST LUMPECTOMY Right 2014   BREAST LUMPECTOMY WITH NEEDLE LOCALIZATION AND AXILLARY SENTINEL LYMPH NODE BX Right 10/13/2012   Procedure: BREAST LUMPECTOMY WITH NEEDLE LOCALIZATION;  Surgeon: Deward GORMAN Null DOUGLAS, MD;  Location: [facility];  Service: General;  Laterality: Right;  Right breast wire localized lumpectomy   DIALYSIS/PERMA CATHETER INSERTION N/A 05/28/2023   Procedure: DIALYSIS/PERMA CATHETER INSERTION;  Surgeon: Melia Lynwood ORN, MD;  Location: [facility];  Service: Cardiovascular;  Laterality: N/A;   INSERTION OF DIALYSIS CATHETER  Right 04/20/2019   Procedure: INSERTION OF  DIALYSIS CATHETER, right internal jugular;  Surgeon: Sheree Penne Bruckner, MD;  Location: [facility] OR;  Service: Vascular;  Laterality: Right;   INSERTION OF DIALYSIS CATHETER Right 09/24/2020   Procedure: INSERTION OF TUNNELED DIALYSIS CATHETER;  Surgeon: Oris Krystal FALCON, MD;  Location: [facility] OR;  Service: Vascular;  Laterality: Right;   IR FLUORO GUIDE CV LINE RIGHT  09/22/2020   IR THORACENTESIS ASP PLEURAL SPACE W/IMG GUIDE  05/19/2019   IR US  GUIDE VASC ACCESS LEFT  09/22/2020   IR US  GUIDE VASC ACCESS RIGHT  09/22/2020   IR VENOCAVAGRAM SVC  09/22/2020   LAMINECTOMY  05/27/2009   Lumbar decompressive laminectomy, fusion and plating for lumbar spinal stensosis   LIGATION OF ARTERIOVENOUS  FISTULA Left 09/24/2020   Procedure: LIGATION OF LEFT ARM ARTERIOVENOUS  FISTULA;  Surgeon: Oris Krystal FALCON, MD;  Location: [facility] OR;  Service: Vascular;  Laterality: Left;   LUMBAR LAMINECTOMY/DECOMPRESSION MICRODISCECTOMY Left 03/23/2013   Procedure: LUMBAR LAMINECTOMY/DECOMPRESSION MICRODISCECTOMY 1 LEVEL;  Surgeon: Alm GORMAN Molt, MD;  Location: [facility];  Service: Neurosurgery;  Laterality: Left;  LUMBAR LAMINECTOMY/DECOMPRESSION MICRODISCECTOMY 1 LEVEL   MASTECTOMY, PARTIAL Left 02/26/2003   ; S/P re-excision of cranial and lateral margins 04/19/2003.    RE-EXCISION OF BREAST CANCER,SUPERIOR MARGINS Right 10/27/2012   Procedure: RE-EXCISION OF BREAST CANCER,SUPERIOR and inferior MARGINS;  Surgeon: Deward GORMAN Curvin DOUGLAS, MD;  Location: [facility] OR;  Service: General;  Laterality: Right;   RE-EXCISION OF BREAST LUMPECTOMY Left 04/2003   TEE WITHOUT CARDIOVERSION N/A 04/04/2019   Procedure: Transesophageal Echocardiogram (Tee);  Surgeon: German Bartlett PEDLAR, MD;  Location: [facility] OR;  Service: Open Heart Surgery;  Laterality: N/A;   THORACIC AORTIC ANEURYSM REPAIR N/A 04/04/2019   Procedure: THORACIC ASCENDING ANEURYSM REPAIR (AAA)  USING 28 MM X 30 CM HEMASHIELD PLATINUM VASCULAR GRAFT;  Surgeon: German Bartlett PEDLAR, MD;  Location: [facility] OR;   Service: Open Heart Surgery;  Laterality: N/A;    Allergies: Allergies[1]  Medications:   Prior to Admission medications  Medication Sig Start Date End Date Taking? Authorizing Provider  acetaminophen  (TYLENOL ) 325 MG tablet Take 650 mg by mouth every 6 (six) hours as needed for mild pain (pain score 1-3) or moderate pain (pain score 4-6).    [provider]  albuterol  (PROVENTIL ) (2.5 MG/3ML) 0.083% nebulizer solution Take 3 mLs (2.5 mg total) by nebulization every 6 (six) hours as needed for wheezing or shortness of breath. 08/23/23   Pokhrel, Vernal, MD  albuterol  (VENTOLIN  HFA) 108 (90 Base) MCG/ACT inhaler Inhale 2 puffs into the lungs every 6 (six) hours as needed for shortness of breath. 10/02/23   [provider]  amLODipine  (NORVASC ) 10 MG tablet Take 1 tablet (10 mg total) by mouth daily. 03/06/22   Setzer, Sandra J, PA-C  apixaban  (ELIQUIS ) 2.5 MG TABS tablet Take 1 tablet (2.5 mg total) by mouth 2 (two) times daily. 03/06/22   Setzer, Sandra J, PA-C  Blood Pressure Monitoring (OMRON 3 SERIES BP MONITOR) DEVI Use as directed. 03/29/24   Tolia, Sunit, DO  carvedilol  (COREG ) 25 MG tablet Take 1 tablet (25 mg total) by mouth 2 (two) times daily with a meal. 05/01/22 06/16/24  Ghimire, Donalda HERO, MD  Cholecalciferol  (VITAMIN D3) 50 MCG (2000 UT) TABS Take 1 tablet by mouth daily. 04/21/24   [provider]  cinacalcet  (SENSIPAR ) 60 MG tablet Take 60 mg by mouth every dialysis (Tuesday, Thursday, and Saturday).    [provider]  cloNIDine  (CATAPRES  - DOSED IN MG/24 HR) 0.2 mg/24hr patch Place 0.2 mg onto the skin once a week. 02/25/24   [provider]  hydrALAZINE  (APRESOLINE ) 100 MG tablet Take 1 tablet (100 mg total) by mouth every 8 (eight) hours. 12/18/22   Dann Candyce RAMAN, MD  isosorbide  mononitrate (IMDUR ) 30 MG 24 hr tablet TAKE 1 TABLET (30MG ) ON DIALYSIS DAYS AND 2 TABLETS (60 MG) ON NON DIALYSIS DAYS. 12/22/23   Michele, Sunit, DO  LOKELMA   10 g PACK packet Take 10 g by mouth every Monday, Wednesday, and Friday. 07/30/23   [provider]  naloxone  (NARCAN ) nasal spray 4 mg/0.1 mL Place 1 spray into the nose as needed (opioid overdose). 02/19/22   [provider]  oxyCODONE -acetaminophen  (PERCOCET) 10-325 MG tablet Take 1 tablet by mouth every 8 (eight) hours as needed for pain.    [provider]  pantoprazole  (PROTONIX ) 40 MG tablet TAKE 1 TABLET (40 MG TOTAL) BY MOUTH 2 (TWO) TIMES DAILY. 05/13/23   Tanda Bleacher, MD  rosuvastatin  (CRESTOR ) 10 MG tablet TAKE 1 TABLET (10 MG TOTAL) BY MOUTH DAILY FOR CHOLESTEROL 08/07/22   Tanda Bleacher, MD  sevelamer  carbonate (RENVELA ) 2.4 g PACK Take 2.4 g by mouth daily.    [provider]  traZODone  (DESYREL ) 50 MG tablet Take 25-50 mg by mouth at bedtime as needed for sleep. 03/19/23   [provider]    Discontinued Meds:   Medications Discontinued During This Encounter  Medication Reason   sodium phosphate  (FLEET) enema 1 enema    senna-docusate (Senokot-S) tablet 1 tablet    heparin  injection 5,000 Units    bisacodyl  (DULCOLAX) EC tablet 5 mg    amLODipine  (NORVASC ) tablet 10 mg    carvedilol  (COREG ) tablet 25 mg     Social History:  reports that she quit smoking about 3 years ago. Her smoking use included cigarettes. She started smoking about 48 years ago. She has a 11 pack-year smoking history. She has never used smokeless tobacco. She reports that she does not currently use alcohol  after a past usage of about 2.0 standard drinks of alcohol  per week. She reports that she does not currently use drugs after having used the following drugs: Cocaine .  Family History:   Family History  Problem Relation Age of Onset   Colon cancer Mother 67   Hypertension Mother    Diabetes Sister 55   Hypertension Sister    Diabetes Brother    Hypertension Brother    Diabetes Brother    Hypertension Brother    Kidney disease Son        s/p renal  transplant   Hypertension Son    Diabetes Son    Multiple sclerosis Son    Bone cancer Sister 81   Breast cancer Neg Hx    Cervical cancer Neg Hx     Blood pressure (!) 163/95, pulse 86, temperature (!) 97.5 F (36.4 C), temperature source Axillary, resp. rate (!) 24, height 5' 3 (1.6 m), weight 47.9 kg, SpO2 98%. Physical Exam: General: Chronically ill appearing, on nasal oxygen  3.5L Head: NCAT sclera not icteric  CV: RRR  Pulm: coarse breath sounds but normal RR Abdomen: non-tender, no masses  Lower extremities: no edema  Neuro: A & O X 3.  Psych:  Responds to questions appropriately Dialysis Access: R chest TDC      Leiam Hopwood, LYNWOOD ORN, MD 05/28/2024, 8:15 AM      [1]  Allergies Allergen Reactions  Shrimp [Shellfish Allergy] Shortness Of Breath   Bactroban  [Mupirocin ] Other (See Comments)    Sores in nose   Hydrocodone  Itching, Nausea And Vomiting and Nausea Only   Chlorhexidine  Gluconate Itching   Latex Itching    Latex gloves cause itchiness and a rash   Tylenol  [Acetaminophen ] Itching    Takes Percocet at home 05/06/23   Zestril  [Lisinopril ] Cough

## 2024-05-29 DIAGNOSIS — J9601 Acute respiratory failure with hypoxia: Secondary | ICD-10-CM | POA: Diagnosis not present

## 2024-05-29 LAB — CBC
HCT: 28.1 % — ABNORMAL LOW (ref 36.0–46.0)
Hemoglobin: 8.8 g/dL — ABNORMAL LOW (ref 12.0–15.0)
MCH: 28.9 pg (ref 26.0–34.0)
MCHC: 31.3 g/dL (ref 30.0–36.0)
MCV: 92.4 fL (ref 80.0–100.0)
Platelets: 246 K/uL (ref 150–400)
RBC: 3.04 MIL/uL — ABNORMAL LOW (ref 3.87–5.11)
RDW: 16.1 % — ABNORMAL HIGH (ref 11.5–15.5)
WBC: 5 K/uL (ref 4.0–10.5)
nRBC: 0 % (ref 0.0–0.2)

## 2024-05-29 LAB — RENAL FUNCTION PANEL
Albumin: 3.9 g/dL (ref 3.5–5.0)
Anion gap: 14 (ref 5–15)
BUN: 59 mg/dL — ABNORMAL HIGH (ref 8–23)
CO2: 30 mmol/L (ref 22–32)
Calcium: 8.9 mg/dL (ref 8.9–10.3)
Chloride: 93 mmol/L — ABNORMAL LOW (ref 98–111)
Creatinine, Ser: 9.49 mg/dL — ABNORMAL HIGH (ref 0.44–1.00)
GFR, Estimated: 4 mL/min — ABNORMAL LOW
Glucose, Bld: 103 mg/dL — ABNORMAL HIGH (ref 70–99)
Phosphorus: 7.1 mg/dL — ABNORMAL HIGH (ref 2.5–4.6)
Potassium: 5.4 mmol/L — ABNORMAL HIGH (ref 3.5–5.1)
Sodium: 137 mmol/L (ref 135–145)

## 2024-05-29 LAB — HEPATITIS B SURFACE ANTIBODY, QUANTITATIVE: Hep B S AB Quant (Post): 43.2 m[IU]/mL

## 2024-05-29 MED ORDER — HEPARIN SODIUM (PORCINE) 1000 UNIT/ML DIALYSIS
1000.0000 [IU] | INTRAMUSCULAR | Status: DC | PRN
  Administered 2024-05-29: 3800 [IU]

## 2024-05-29 MED ORDER — SODIUM ZIRCONIUM CYCLOSILICATE 10 G PO PACK
10.0000 g | PACK | ORAL | Status: DC
  Administered 2024-05-29: 10 g via ORAL
  Filled 2024-05-29 (×2): qty 1

## 2024-05-29 MED ORDER — OXYCODONE HCL 5 MG PO TABS
ORAL_TABLET | ORAL | Status: AC
  Filled 2024-05-29: qty 1

## 2024-05-29 MED ORDER — SEVELAMER CARBONATE 2.4 G PO PACK
2.4000 g | PACK | Freq: Three times a day (TID) | ORAL | Status: DC
  Administered 2024-05-29: 2.4 g via ORAL
  Filled 2024-05-29 (×6): qty 1

## 2024-05-29 MED ORDER — DARBEPOETIN ALFA 60 MCG/0.3ML IJ SOSY
60.0000 ug | PREFILLED_SYRINGE | INTRAMUSCULAR | Status: DC
  Administered 2024-05-29: 60 ug via SUBCUTANEOUS
  Filled 2024-05-29: qty 0.3

## 2024-05-29 MED ORDER — HEPARIN SODIUM (PORCINE) 1000 UNIT/ML IJ SOLN
INTRAMUSCULAR | Status: AC
  Filled 2024-05-29: qty 4

## 2024-05-29 NOTE — TOC Initial Note (Addendum)
 Transition of Care [facility]) - Initial/Assessment Note    Patient Details  Name: Monique Henderson MRN: [id number] Date of Birth: 02-06-1951  [facility]) CM/SW Contact:    Landry DELENA Senters, RN Phone Number: 05/29/2024, 2:40 PM  Clinical Narrative:                 RR:fziprjo history significant of ESRD on HD TThS c/b frequent admissions for fluids overload, CVA c/b R hemiparesis, PAF on Eliquis , active smoker, HFpEF(EF 45% in 06/2023) p/w HTN urgency/NSTEMI and SOB 2/2 volume overload   Patient lives with adult son, reports having support from him and transport home from either son or sister at discharge.   Patient has PCP, her sister manages her medications, DME reviewed-cane, rolling walker, BSC, was recently discharged with new home O2 - 2L through [employer]. Patient reports O2 concentrator was not working properly, so she has been using the portable tanks provided by [employer], which are now all empty. She did not attempt to call [employer] to fix concentrator. CM reinforced education with patient on calling [employer] to fix equipment when there are issues. CM let [employer] know of situation. They will be delivering O2 tank to room for transport home and addressing issues with home supplies. Patient, sister, and son are aware of this and know to be expecting a call from [employer].    Patient receives out-pt HD at [facility] [place], TTS, 12pm. Transportation plan in place.      Potential plan for d/c after dialysis tomorrow.   CM will continue to follow.  Expected Discharge Plan: Home/Self Care Barriers to Discharge: Continued Medical Work up   Patient Goals and CMS Choice            Expected Discharge Plan and Services       Living arrangements for the past 2 months: Apartment                                      Prior Living Arrangements/Services Living arrangements for the past 2 months: Apartment Lives with:: Self, Adult Children Patient language and need for  interpreter reviewed:: Yes Do you feel safe going back to the place where you live?: Yes      Need for Family Participation in Patient Care: Yes (Comment) Care giver support system in place?: Yes (comment) Current home services: DME (cane ,walker, BSC) Criminal Activity/Legal Involvement Pertinent to Current Situation/Hospitalization: No - Comment as needed  Activities of Daily Living   ADL Screening (condition at time of admission) Independently performs ADLs?: Yes (appropriate for developmental age) Is the patient deaf or have difficulty hearing?: No Does the patient have difficulty seeing, even when wearing glasses/contacts?: No Does the patient have difficulty concentrating, remembering, or making decisions?: No  Permission Sought/Granted                  Emotional Assessment Appearance:: Developmentally appropriate Attitude/Demeanor/Rapport: Engaged Affect (typically observed): Calm Orientation: : Oriented to Self, Oriented to Place, Oriented to  Time, Oriented to Situation Alcohol  / Substance Use: Not Applicable Psych Involvement: No (comment)  Admission diagnosis:  Hyperkalemia [E87.5] Acute pulmonary edema (HCC) [J81.0] Stage 5 chronic kidney disease on chronic dialysis (HCC) [N18.6, Z99.2] Hypertension, unspecified type [I10] Acute hypoxic respiratory failure (HCC) [J96.01] Chronic hypoxic respiratory failure (HCC) [J96.11] Patient Active Problem List   Diagnosis Date Noted   ESRD (end stage renal disease) on dialysis (  HCC) 02/14/2024   Acute exacerbation of chronic obstructive pulmonary disease (COPD) (HCC) 08/22/2023   SOB (shortness of breath) 08/21/2023   History of COPD 07/22/2023   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   History of aortic dissection 06/05/2023   History of cocaine  abuse (HCC) 06/05/2023   Chronic heart failure with mildly reduced ejection fraction (HFmrEF, 41-49%) (HCC) 06/05/2023   Long term (current) use of anticoagulants 06/05/2023    Acute hypoxic respiratory failure (HCC) 06/02/2023   Acute hypoxemic respiratory failure (HCC) 05/06/2023   Advance care planning 01/15/2023   Right shoulder pain 06/17/2022   Edema of right upper arm 06/17/2022   Hemiparesis affecting right side as late effect of cerebrovascular accident (CVA) (HCC) 04/06/2022   Hypervolemia associated with renal insufficiency 01/29/2022   HLD (hyperlipidemia) 01/29/2022   Polysubstance abuse (HCC) 01/06/2022   Acute on chronic combined systolic and diastolic CHF (congestive heart failure) (HCC) 01/06/2022   History of hepatitis C 12/08/2021   Hypertensive urgency 10/29/2021   Fluid overload 10/29/2021   Prolonged QT interval 10/29/2021   Hypertensive emergency 08/23/2021   Pure hypercholesterolemia 02/14/2021   Metabolic acidosis with increased anion gap and accumulation of organic acids 09/21/2020   Hypertension 09/21/2020   Hyperkalemia 08/28/2019   Aortic atherosclerosis 07/05/2019   History of CVA (cerebrovascular accident) 05/29/2019   Paroxysmal atrial fibrillation (HCC) 05/29/2019   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 04/07/2019   Non compliance with medical treatment 12/04/2017   Chronic low back pain 06/22/2016   Insomnia 03/14/2015   S/P lumbar spinal fusion 01/18/2014   Tobacco use disorder 04/19/2009   GERD 09/16/2006   PCP:  Shelda Atlas, MD Pharmacy:   [facility] - [place], [place] - [address] [place] [address] Phone: [phone] Fax: [phone]     Social Drivers of Health (SDOH) Social History: SDOH Screenings   Food Insecurity: No Food Insecurity (05/27/2024)  Housing: Low Risk (05/27/2024)  Transportation Needs: No Transportation Needs (05/27/2024)  Utilities: Not At Risk (05/27/2024)  Depression (PHQ2-9): Low Risk (06/17/2022)  Recent Concern: Depression (PHQ2-9) - Medium Risk (05/22/2022)  Social Connections: Socially Isolated (05/27/2024)  Tobacco Use:  Medium Risk (05/27/2024)   SDOH Interventions: Social Connections Interventions: Community Resources Provided, Inpatient TOC   Readmission Risk Interventions    02/15/2024    2:22 PM 07/23/2023   12:26 PM 06/03/2023    3:11 PM  Readmission Risk Prevention Plan  Transportation Screening Complete Complete Complete  Medication Review (RN Care Manager) Referral to Pharmacy Referral to Pharmacy Referral to Pharmacy  PCP or Specialist appointment within 3-5 days of discharge Complete  Complete  HRI or Home Care Consult Complete Complete Complete  SW Recovery Care/Counseling Consult Complete Complete Complete  Palliative Care Screening Not Applicable Not Applicable Not Applicable  Skilled Nursing Facility Not Applicable Not Applicable Not Applicable

## 2024-05-29 NOTE — Plan of Care (Signed)
   Problem: Health Behavior/Discharge Planning: Goal: Ability to manage health-related needs will improve Outcome: Progressing   Problem: Clinical Measurements: Goal: Will remain free from infection Outcome: Progressing Goal: Diagnostic test results will improve Outcome: Progressing

## 2024-05-29 NOTE — Discharge Instructions (Signed)
 To address social isolation:  Education officer, [employer] [place]: [phone] / [address], [place], [place] [address]  -Dial 988: Talk lifeline 24/7.  -[facility] Warmline: [phone]

## 2024-05-29 NOTE — Progress Notes (Signed)
 [facility] Progress Note  Name: Monique Henderson MRN: [id number] DOB: Aug 07, 1950   Subjective:  Seen and examined on dialysis.  Procedure supervised.  Blood pressure 148/70 and HR 73.  Tolerating goal.  Tunn catheter in use. States that she lives with her son.     Review of systems:  Denies shortness of breath or chest pain; on oxygen  at home but states she's not sure how much  Denies n/v  No dizziness or cramping   -----------------  Background on consult:  Monique Henderson is an 73 y.o. female with ESRD on HD, HTN, HLD, PAFib, prior CVA with hemiparesis, COPD on chronic O2 admitted with hypoxic respiratory failure, recently discharged from [facility] on 12/19 with dyspnea. She was sent home with oxygen  but ran out of oxygen  at home and had not had any tanks delivered.  CXR showing pulmonary edema.  Labs notable for Na 141, K 6.1, BUN 62, Pro BNP > 35K, hypertensive and tachycardic on arrival. On 4L [place]. Oriented x 2. Denies chest pain initially then says she is having chest pain. Last dialysis Wed for 2:50 mins. Left 5kg over dry weight.   No intake or output data in the 24 hours ending 05/29/24 1005  Vitals:  Vitals:   05/29/24 0900 05/29/24 0921 05/29/24 0945 05/29/24 1000  BP: (!) 149/85 (!) 154/78 (!) 179/78 (!) 147/68  Pulse: (!) 59 64 (!) 59 (!) 30  Resp: 17 18 14 15   Temp:      TempSrc:      SpO2: 100% 100% 100% 100%  Weight:      Height:         Physical Exam:  General elderly female in bed in no acute distress HEENT normocephalic atraumatic extraocular movements intact sclera anicteric Neck supple trachea midline Lungs clear to auscultation bilaterally normal work of breathing at rest  Heart S1S2 no rub irregular Abdomen soft nontender nondistended Extremities no edema  Psych normal mood and affect Neuro - alert and conversant  Access RIJ tunn catheter in use    Medications reviewed   Labs:     Latest Ref Rng & Units 05/29/2024    3:56 AM  05/28/2024    6:04 AM 05/27/2024   10:55 AM  BMP  Glucose 70 - 99 mg/dL 896  86  893   BUN 8 - 23 mg/dL 59  34  60   Creatinine 0.44 - 1.00 mg/dL 0.50  2.94  87.19   Sodium 135 - 145 mmol/L 137  136  139   Potassium 3.5 - 5.1 mmol/L 5.4  4.7  5.7   Chloride 98 - 111 mmol/L 93  94  103   CO2 22 - 32 mmol/L 30  27    Calcium  8.9 - 10.3 mg/dL 8.9  9.0      Dialysis Orders:  Unit: AF Schedule: TTS Time: 3:30  EDW: 46 kg  Flows: 400  Bath: 2K/2Ca Access: TDC Heparin : None  ESA: None (rec'd mircera 50 mcg on 12/2) VDRA: Hectorol  7 q HD, Cinacalcet  60 q HD She left 5kg above her EDW on 12/24.      Assessment/Plan:    Acute on chronic hypoxic respiratory failure 2/2 volume overload. HD today to optimize volume  ESRD   Extra HD today for volume  Then transition back to her usual TTS schedule.  If discharged today then go outpatient  Hyperkalemia. S/p Lokelma .  Continue with lokelma  three times a week  Hypertension  Improved  Optimize  volume status with HD and continue current regimen  Note concurrent afib - rate-controlling agents per primary team  Volume. Volume overload as above. 5 kg over EDW on 12/24.  Additional HD as above to optimize  Anemia of CKD.  Start aranesp  60 mcg weekly for now.  Hb 8.8  Metabolic bone disease. Hyperphos. Resume renvela  packets.  On sensipar  with HD.     Disposition - per primary team. Reached out to primary team     Katheryn JAYSON Saba, MD 05/29/2024 10:32 AM

## 2024-05-29 NOTE — Progress Notes (Signed)
 Pt receives out-pt HD at [facility], TTS, 12pm, will continue to assist as needed.   Lavanda Lenzie Sandler Dialysis navigator [phone]

## 2024-05-29 NOTE — Progress Notes (Addendum)
 "                                                                                                                                                                                                                                                                                PROGRESS NOTE     Patient Demographics:    Monique Henderson, is a 73 y.o. female, DOB - 01-Aug-1950, FMW:[id number]  Outpatient Primary MD for the patient is Patient, No Pcp Per    LOS - 1  Admit date - 05/27/2024    Chief Complaint  Patient presents with   Shortness of Breath       Brief Narrative (HPI from H&P)   73 y.o female with history of ESRD (TTS), HTN, HLD,CVA with right hemiparesis, P -A-fib on Eliquis , former smoker, COPD (on 2/3 L O2), chronic CHF. Presenting once again to the ED with chief complaint of shortness of breath, hypoxia, and hypertensive state.  In the ER she was diagnosed with fluid overload, acute hypoxic respiratory failure and admitted to the hospital   Subjective:   Patient in bed, appears comfortable, denies any headache, no fever, no chest pain or pressure, no shortness of breath , no abdominal pain. No focal weakness.   Assessment  & Plan :    Acute hypoxic respiratory failure (HCC) due to fluid overload, history of chronic hypoxic respiratory failure due to COPD with running out of home oxygen  supply.  No signs of COPD exacerbation or pneumonia, she had fluid overload upon exam on arrival along with her home oxygen  running out, after urgent HD and fluid removal she is improving currently on 2 L nasal cannula oxygen , next HD today as a extra run and then again tomorrow.  Continue to monitor.   ESRD (end stage renal disease) on dialysis (HCC)  On hemodialysis T- T- S, nephrology monitoring getting an extra run of HD on 05/29/2024 and then again on 05/30/2024 thereafter likely discharge home.   Acute on chronic heart failure with mildly reduced ejection fraction (HFmrEF, 41-49%) (HCC) improved  after fluid removal via dialysis, continue home beta-blocker, hydralazine  combination.  Not on ACE/ARB or Entresto due to underlying   Hyperkalemia.  Lokelma  and HD.    Pure hypercholesterolemia Continue statins   Hypertension  History of hypertension, noncompliant with current medications, Will resume meds accordingly  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  Noted, stable   Insomnia As needed trazodone , and melatonin   GERD  Continue PPI   History of cocaine  abuse polysubstance abuse (HCC) Patient reports no use of cocaine  or recreational drugs recently counseled to continue to abstain.   Paroxysmal atrial fibrillation (HCC)  Continue Coreg  and Eliquis    History of CVA (cerebrovascular accident)  Continue Eliquis , statins   Non compliance with medical treatment Patient advised to be compliant with current medication and hemodialysis   Tobacco use disorder Counseled regarding smoking cessation, Nicoderm patch provided   Hypertensive urgency Poorly controlled, upon arrival blood pressure was very high likely due to respiratory distress, continue combination of Norvasc , Coreg , clonidine , hydralazine , monitor.  History of COPD History of COPD with chronic respiratory failure, O2 dependent, ran out of oxygen  at home, continue supportive care, no wheezing, will arrange for home oxygen  upon discharge.  HLD (hyperlipidemia) Continue statins      Condition -  Guarded  Family Communication  : None present  Code Status :  Full  Consults  :  Renal  PUD Prophylaxis :    Procedures  :            Disposition Plan  :    Status is: Observation  DVT Prophylaxis  :    apixaban  (ELIQUIS ) tablet 2.5 mg Start: 05/27/24 1230 SCDs Start: 05/27/24 1220 apixaban  (ELIQUIS ) tablet 2.5 mg    Lab Results  Component Value Date   PLT 246 05/29/2024    Diet :  Diet Order             Diet regular Room service appropriate? Yes; Fluid consistency: Thin  Diet effective now                     Inpatient Medications  Scheduled Meds:  amLODipine   10 mg Oral Daily   apixaban   2.5 mg Oral BID   carvedilol   25 mg Oral BID WC   cholecalciferol   1,000 Units Oral Daily   cloNIDine   0.2 mg Transdermal Weekly   hydrALAZINE   100 mg Oral Q8H   nitroGLYCERIN   1 inch Topical Once   pantoprazole   40 mg Oral BID   rosuvastatin   10 mg Oral Daily   senna-docusate  1 tablet Oral QHS   sodium chloride  flush  3 mL Intravenous Q12H   sodium chloride  flush  3 mL Intravenous Q12H   sodium zirconium cyclosilicate   10 g Oral Q M,W,F   Continuous Infusions: PRN Meds:.cinacalcet , heparin , hydrALAZINE , ipratropium, labetalol , ondansetron  **OR** ondansetron  (ZOFRAN ) IV, oxyCODONE , traZODone   Antibiotics  :    Anti-infectives (From admission, onward)    None         Objective:   Vitals:   05/28/24 0800 05/28/24 1223 05/28/24 2000 05/29/24 0520  BP: 116/72 121/83 134/77 (!) 141/77  Pulse: 72 77 71 69  Resp: 20 20 17 17   Temp: 97.6 F (36.4 C) 98.6 F (37 C) 98.7 F (37.1 C) 98.7 F (37.1 C)  TempSrc: Axillary Oral Oral Oral  SpO2: 97% 98% 97% 98%  Weight:      Height:        Wt Readings from Last 3 Encounters:  05/28/24 47.9 kg  05/19/24 48.4 kg  03/13/24 47.6 kg    No intake or output data in the 24  hours ending 05/29/24 0805    Physical Exam  Awake Alert, No new F.N deficits, Normal affect [place].AT,PERRAL Supple Neck, No JVD,   Symmetrical Chest wall movement, Good air movement bilaterally, CTAB RRR,No Gallops,Rubs or new Murmurs,  +ve B.Sounds, Abd Soft, No tenderness,   No Cyanosis, Clubbing or edema       Data Review:    Recent Labs  Lab 05/27/24 1051 05/27/24 1055 05/28/24 0604 05/29/24 0356  WBC 6.2  --  4.4 5.0  HGB 10.1* 11.6* 9.2* 8.8*  HCT 33.6* 34.0* 28.6* 28.1*  PLT 277  --  235 246  MCV 95.2  --  92.0 92.4  MCH 28.6  --  29.6 28.9  MCHC 30.1  --  32.2 31.3  RDW 16.7*  --  16.6* 16.1*    Recent Labs  Lab 05/27/24 1051  05/27/24 1055 05/27/24 1255 05/28/24 0604 05/29/24 0356  NA 141 139  --  136 137  K 6.1* 5.7*  --  4.7 5.4*  CL 98 103  --  94* 93*  CO2 25  --   --  27 30  ANIONGAP 19*  --   --  15 14  GLUCOSE 111* 106*  --  86 103*  BUN 62* 60*  --  34* 59*  CREATININE 11.60* 12.80*  --  7.05* 9.49*  ALBUMIN   --   --   --   --  3.9  INR  --   --  1.3*  --   --   MG  --   --  1.1* 2.1  --   PHOS  --   --  3.1 6.2* 7.1*  CALCIUM  9.8  --   --  9.0 8.9      Recent Labs  Lab 05/27/24 1051 05/27/24 1255 05/28/24 0604 05/29/24 0356  INR  --  1.3*  --   --   MG  --  1.1* 2.1  --   CALCIUM  9.8  --  9.0 8.9    --------------------------------------------------------------------------------------------------------------- Lab Results  Component Value Date   CHOL 104 01/29/2023   HDL 49 01/29/2023   LDLCALC 46 01/29/2023   TRIG 46 01/29/2023   CHOLHDL 2.1 01/29/2023    Lab Results  Component Value Date   HGBA1C 4.1 (L) 09/01/2023      Micro Results No results found for this or any previous visit (from the past 240 hours).  Radiology Report DG Chest 2 View Result Date: 05/27/2024 EXAM: 2 VIEW(S) XRAY OF THE CHEST 05/27/2024 10:33:30 AM COMPARISON: 05/18/2024 CLINICAL HISTORY: cp FINDINGS: LINES, TUBES AND DEVICES: Right IJ CVC in place with tip overlying lower right atrium. LUNGS AND PLEURA: Bilateral interstitial opacities are slightly decreased compared to 05/18/24. There are persistent right basilar opacities which may reflect atelectasis versus infection. Trace pleural effusions which are slightly decreased. No pneumothorax. HEART AND MEDIASTINUM: Cardiomegaly, unchanged. Aortic arch atherosclerosis. BONES AND SOFT TISSUES: Post-surgical changes overlying chest. Sternotomy wires noted. No acute osseous abnormality. IMPRESSION: 1. Bilateral interstitial opacities suggestive of pulmonary edema, slightly decreased from prior. 2. Persistent right basilar opacities which may reflect  atelectasis or infection. 3. Trace pleural effusions, slightly decreased. 4. Cardiomegaly, unchanged. 5. Right IJ CVC tip overlies the lower right atrium. Electronically signed by: Donnice Mania MD 05/27/2024 12:04 PM EST RP Workstation: [id number]     Signature  -   Lavada Stank M.D on 05/29/2024 at 8:05 AM   -  To page go to [email or web address]   "

## 2024-05-30 ENCOUNTER — Other Ambulatory Visit (HOSPITAL_COMMUNITY): Payer: Self-pay

## 2024-05-30 DIAGNOSIS — J9601 Acute respiratory failure with hypoxia: Secondary | ICD-10-CM | POA: Diagnosis not present

## 2024-05-30 LAB — RENAL FUNCTION PANEL
Albumin: 4 g/dL (ref 3.5–5.0)
Anion gap: 13 (ref 5–15)
BUN: 33 mg/dL — ABNORMAL HIGH (ref 8–23)
CO2: 29 mmol/L (ref 22–32)
Calcium: 9.3 mg/dL (ref 8.9–10.3)
Chloride: 94 mmol/L — ABNORMAL LOW (ref 98–111)
Creatinine, Ser: 6.87 mg/dL — ABNORMAL HIGH (ref 0.44–1.00)
GFR, Estimated: 6 mL/min — ABNORMAL LOW
Glucose, Bld: 103 mg/dL — ABNORMAL HIGH (ref 70–99)
Phosphorus: 4.6 mg/dL (ref 2.5–4.6)
Potassium: 4.2 mmol/L (ref 3.5–5.1)
Sodium: 135 mmol/L (ref 135–145)

## 2024-05-30 LAB — CBC
HCT: 28.5 % — ABNORMAL LOW (ref 36.0–46.0)
Hemoglobin: 8.8 g/dL — ABNORMAL LOW (ref 12.0–15.0)
MCH: 29.4 pg (ref 26.0–34.0)
MCHC: 30.9 g/dL (ref 30.0–36.0)
MCV: 95.3 fL (ref 80.0–100.0)
Platelets: 246 K/uL (ref 150–400)
RBC: 2.99 MIL/uL — ABNORMAL LOW (ref 3.87–5.11)
RDW: 16 % — ABNORMAL HIGH (ref 11.5–15.5)
WBC: 7 K/uL (ref 4.0–10.5)
nRBC: 0 % (ref 0.0–0.2)

## 2024-05-30 LAB — GLUCOSE, CAPILLARY: Glucose-Capillary: 103 mg/dL — ABNORMAL HIGH (ref 70–99)

## 2024-05-30 MED ORDER — HEPARIN SODIUM (PORCINE) 1000 UNIT/ML DIALYSIS
1000.0000 [IU] | INTRAMUSCULAR | Status: DC | PRN
  Administered 2024-05-30: 2800 [IU]

## 2024-05-30 MED ORDER — CINACALCET HCL 30 MG PO TABS
ORAL_TABLET | ORAL | Status: AC
  Filled 2024-05-30: qty 2

## 2024-05-30 MED ORDER — PENTAFLUOROPROP-TETRAFLUOROETH EX AERO: 1.0000 | INHALATION_SPRAY | CUTANEOUS | Status: DC | PRN

## 2024-05-30 MED ORDER — HEPARIN SODIUM (PORCINE) 1000 UNIT/ML IJ SOLN
INTRAMUSCULAR | Status: AC
  Filled 2024-05-30: qty 4

## 2024-05-30 MED ORDER — LIDOCAINE HCL (PF) 1 % IJ SOLN: 5.0000 mL | INTRAMUSCULAR | Status: DC | PRN

## 2024-05-30 MED ORDER — LIDOCAINE-PRILOCAINE 2.5-2.5 % EX CREA: 1.0000 | TOPICAL_CREAM | CUTANEOUS | Status: DC | PRN

## 2024-05-30 MED ORDER — ALTEPLASE 2 MG IJ SOLR: 2.0000 mg | Freq: Once | INTRAMUSCULAR | Status: DC | PRN

## 2024-05-30 MED ORDER — ANTICOAGULANT SODIUM CITRATE 4% (200MG/5ML) IV SOLN: 5.0000 mL | Status: DC | PRN

## 2024-05-30 MED ORDER — LOKELMA 10 G PO PACK
10.0000 g | PACK | ORAL | 0 refills | Status: AC
  Filled 2024-05-30: qty 13, 30d supply, fill #0

## 2024-05-30 NOTE — TOC Transition Note (Signed)
 Transition of Care [facility]) - Discharge Note   Patient Details  Name: Monique Henderson MRN: [id number] Date of Birth: Dec 04, 1950  [facility] CM/SW Contact:  Landry DELENA Senters, RN Phone Number: 05/30/2024, 9:15 AM   Clinical Narrative:    Patient will be discharging home today, with transportation from family.   Home O2 issues sent to [employer] (see CM note from 12/29). O2 tank in room for transport home today. Family is aware to expect a call from [employer] to address problems with home equipment.   No further needs identified by CM.   Final next level of care: Home/Self Care Barriers to Discharge: No Barriers Identified   Patient Goals and CMS Choice            Discharge Placement                       Discharge Plan and Services Additional resources added to the After Visit Summary for                                       Social Drivers of Health (SDOH) Interventions SDOH Screenings   Food Insecurity: No Food Insecurity (05/27/2024)  Housing: Low Risk (05/27/2024)  Transportation Needs: No Transportation Needs (05/27/2024)  Utilities: Not At Risk (05/27/2024)  Depression (PHQ2-9): Low Risk (06/17/2022)  Recent Concern: Depression (PHQ2-9) - Medium Risk (05/22/2022)  Social Connections: Socially Isolated (05/27/2024)  Tobacco Use: Medium Risk (05/27/2024)     Readmission Risk Interventions    02/15/2024    2:22 PM 07/23/2023   12:26 PM 06/03/2023    3:11 PM  Readmission Risk Prevention Plan  Transportation Screening Complete Complete Complete  Medication Review (RN Care Manager) Referral to Pharmacy Referral to Pharmacy Referral to Pharmacy  PCP or Specialist appointment within 3-5 days of discharge Complete  Complete  HRI or Home Care Consult Complete Complete Complete  SW Recovery Care/Counseling Consult Complete Complete Complete  Palliative Care Screening Not Applicable Not Applicable Not Applicable  Skilled Nursing Facility Not  Applicable Not Applicable Not Applicable

## 2024-05-30 NOTE — Discharge Planning (Signed)
 [facility] Patient Discharge Orders - [facility] CLINIC: AF  Patient's name: Monique Henderson Admit/DC Dates: 05/27/2024 - 05/30/2024  DISCHARGE DIAGNOSES: AHRF/volume overload  Hyperkalemia HTN  HD ORDER CHANGES: Heparin  change: no EDW Change: YES New EDW: 43.5kg Bath Change: no  ANEMIA MANAGEMENT: Aranesp : Given: yes   Amount/Date of last dose: 60mcg on 12/29 ESA dose for discharge: mircera 100 mcg IV q 2 weeks, to start on 06/05/2024 IV Iron dose at discharge: per protocol Transfusion: Given: no  BONE/MINERAL MEDICATIONS: Hectorol /Calcitriol change: no Sensipar /Parsabiv change: no  ACCESS INTERVENTION/CHANGE: no Details:   RECENT LABS: Recent Labs  Lab 05/30/24 0848  HGB 8.8*  NA 135  K 4.2  CALCIUM  9.3  PHOS 4.6  ALBUMIN  4.0    IV ANTIBIOTICS: no Details:  OTHER ANTICOAGULATION: no Details:  OTHER/APPTS/LAB ORDERS: - Make sure has Lokelma  - to take on her non-HD days  D/C Meds to be reconciled by nurse after every discharge.  Completed By: Izetta Boehringer, PA-C [facility] Pager [phone]   Reviewed by: MD:______ RN_______

## 2024-05-30 NOTE — Progress Notes (Signed)
" °   05/30/24 1144  Vitals  Temp 99.4 F (37.4 C)  Pulse Rate 75  Resp (!) 21  BP (!) 176/81  SpO2 95 %  O2 Device Room Air  Weight 43.7 kg  Type of Weight Post-Dialysis  Post Treatment  Dialyzer Clearance Lightly streaked  Liters Processed 72  Fluid Removed (mL) 2000 mL  Tolerated HD Treatment Yes  Post-Hemodialysis Comments Pt. tolerated tx well    "

## 2024-05-30 NOTE — Discharge Summary (Signed)
 "                                                                                                                                                                               Discharge summary note.  Monique Henderson FMW:[id number] DOB: Nov 07, 1950 DOA: 05/27/2024  PCP: Shelda Atlas, MD  Admit date: 05/27/2024  Discharge date: 05/30/2024  Admitted From: Home   Disposition:  Home   Recommendations for Outpatient Follow-up:   Follow up with PCP in 1-2 weeks  PCP Please obtain BMP/CBC, 2 view CXR in 1week,  (see Discharge instructions)   PCP Please follow up on the following pending results:    Home Health: None   Equipment/Devices: None  Consultations: Renal Discharge Condition: Stable    CODE STATUS: Full    Diet Recommendation: Renal diet with strict 1.2 L fluid restriction per day    Chief Complaint  Patient presents with   Shortness of Breath     Brief history of present illness from the day of admission and additional interim summary    73 y.o female with history of ESRD (TTS), HTN, HLD,CVA with right hemiparesis, P -A-fib on Eliquis , former smoker, COPD (on 2/3 L O2), chronic CHF. Presenting once again to the ED with chief complaint of shortness of breath, hypoxia, and hypertensive state. In the ER she was diagnosed with fluid overload, acute hypoxic respiratory failure and admitted to the hospital                                                                  Hospital Course    Acute hypoxic respiratory failure (HCC) due to fluid overload, history of chronic hypoxic respiratory failure due to COPD with running out of home oxygen  supply.  No signs of COPD exacerbation or pneumonia, she had fluid overload upon exam on arrival along with her home oxygen  running out, after urgent HD and fluid removal she is improving currently on 2 L nasal cannula oxygen , got 2 extra runs of HD, will get her routine HD today and then will be discharged after that, she is symptom-free  eager to go home, was initially refusing HD in the hospital after counseling agreed.  She is back to her baseline walking to the bathroom by herself in no distress, home oxygen  to be arranged by TOC as she ran out of her home oxygen .   ESRD (end stage renal disease) on dialysis (HCC)  On hemodialysis T- T- S,  nephrology monitoring getting an extra run of HD Imes 2 in the hospital next HD today and then discharge home.  Discussed with nephrology yesterday.   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  Not on ACE/ARB or Entresto due to underlying.  Now compensated.   Hypertensive urgency Poorly controlled, question compliance with her home medications along with distress from fluid overload and shortness of breath, stable now on home medications, counseled to be compliant.   Hyperkalemia.  Lokelma  and HD.     Pure hypercholesterolemia Continue statins   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  Noted, stable   Insomnia As needed trazodone , and melatonin   GERD  Continue PPI   History of cocaine  abuse polysubstance abuse (HCC) Patient reports no use of cocaine  or recreational drugs recently counseled to continue to abstain.   Paroxysmal atrial fibrillation (HCC)  Continue Coreg  and Eliquis    History of CVA (cerebrovascular accident)  Continue Eliquis , statins   Non compliance with medical treatment Patient advised to be compliant with current medication and hemodialysis   Tobacco use disorder Counseled regarding smoking cessation, Nicoderm patch provided while she was in the hospital.   History of COPD History of COPD with chronic respiratory failure, O2 dependent, ran out of oxygen  at home, continue supportive care, no wheezing, case management will arrange for home oxygen  upon discharge.   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   Hyperkalemia   Hypertension   Pure hypercholesterolemia   Polysubstance abuse (HCC)   Chronic heart failure with mildly reduced ejection fraction (HFmrEF, 41-49%) (HCC)   ESRD (end stage renal disease) on dialysis (HCC)   Tobacco use disorder   Non compliance with medical treatment   History of CVA (cerebrovascular accident)   Paroxysmal atrial fibrillation (HCC)   History of cocaine  abuse (HCC)   Hypertensive urgency   HLD (hyperlipidemia)   History of COPD    Discharge instructions    Discharge Instructions     Discharge instructions   Complete by: As directed    Follow with Primary MD Shelda Atlas, MD in 7 days   Get CBC, CMP, Magnesium , 2 view Chest X ray -  checked next visit with your primary MD   Activity: As tolerated with Full fall precautions use walker/cane & assistance as needed  Disposition Home    Diet: Renal diet with strict 1.2 L fluid restriction per day  Special Instructions: If you have smoked or chewed Tobacco  in the last 2 yrs please stop smoking, stop any regular Alcohol   and or any Recreational drug use.  On your next visit with your primary care physician please Get Medicines reviewed and adjusted.  Please request your Prim.MD to go over all Hospital Tests and Procedure/Radiological results at the follow up, please get all Hospital records sent to your Prim MD by signing hospital release before you go home.  If you experience worsening of your admission symptoms, develop shortness of breath, life threatening emergency, suicidal or homicidal thoughts you must seek medical attention immediately by calling 911 or calling your MD immediately  if symptoms less severe.  You Must read complete instructions/literature along with all the possible adverse reactions/side effects for  all the Medicines you take and that have been prescribed to you. Take any new Medicines after you have completely understood and accpet all the possible adverse reactions/side effects.   Do not drive when taking Pain medications.  Do not take more than prescribed Pain, Sleep and Anxiety Medications  Wear Seat belts while driving.   Increase activity slowly   Complete by: As directed    No wound care   Complete by: As directed        Discharge Medications   Allergies as of 05/30/2024       Reactions   Shrimp [shellfish Allergy] Shortness Of Breath   Bactroban  [mupirocin ] Other (See Comments)   Sores in nose   Hydrocodone  Itching, Nausea And Vomiting, Nausea Only   Chlorhexidine  Gluconate Itching   Latex Itching   Latex gloves cause itchiness and a rash   Tylenol  [acetaminophen ] Itching   Takes Percocet at home 05/06/23   Zestril  [lisinopril ] Cough        Medication List     TAKE these medications    acetaminophen  325 MG tablet Commonly known as: TYLENOL  Take 650 mg by mouth every 6 (six) hours as needed for mild pain (pain score 1-3) or moderate pain (pain score 4-6).   albuterol  (2.5 MG/3ML) 0.083% nebulizer solution Commonly known as: PROVENTIL  Take 3 mLs (2.5 mg total) by nebulization every 6 (six) hours as needed for wheezing or shortness of breath.   albuterol  108 (90 Base) MCG/ACT inhaler Commonly known as: VENTOLIN  HFA Inhale 2 puffs into the lungs every 6 (six) hours as needed for shortness of breath.   amLODipine  10 MG tablet Commonly known as: NORVASC  Take 1 tablet (10 mg total) by mouth daily.   apixaban  2.5 MG Tabs tablet Commonly known as: ELIQUIS  Take 1 tablet (2.5 mg total) by mouth 2 (two) times daily.   carvedilol  25 MG tablet Commonly known as: COREG  Take 1 tablet (25 mg total) by mouth 2 (two) times daily with a meal.   cinacalcet  60 MG tablet Commonly known as: SENSIPAR  Take 60 mg by mouth every dialysis (Tuesday, Thursday, and  Saturday).   cloNIDine  0.2 mg/24hr patch Commonly known as: CATAPRES  - Dosed in mg/24 hr Place 0.2 mg onto the skin once a week.   hydrALAZINE  100 MG tablet Commonly known as: APRESOLINE  Take 1 tablet (100 mg total) by mouth every 8 (eight) hours.   isosorbide  mononitrate 30 MG 24 hr tablet Commonly known as: IMDUR  TAKE 1 TABLET (30MG ) ON DIALYSIS DAYS AND 2 TABLETS (60 MG) ON NON DIALYSIS DAYS.   Lokelma  10 g Pack packet Generic drug: sodium zirconium cyclosilicate  Take 10 g by mouth every Monday, Wednesday, and Friday.   naloxone  4 MG/0.1ML Liqd nasal spray kit Commonly known as: NARCAN  Place 1 spray into the nose as needed (opioid overdose).   Omron 3 Series BP Monitor Devi Use as directed.   oxyCODONE -acetaminophen  10-325 MG tablet Commonly known as: PERCOCET Take 1 tablet by mouth every 8 (eight) hours as needed for pain.   pantoprazole  40 MG tablet Commonly known as: PROTONIX  TAKE 1 TABLET (40 MG TOTAL) BY MOUTH 2 (TWO) TIMES DAILY.   rosuvastatin  10 MG tablet Commonly known as: CRESTOR  TAKE 1 TABLET (10 MG TOTAL) BY MOUTH DAILY FOR CHOLESTEROL   sevelamer  carbonate 2.4 g Pack Commonly known as: RENVELA  Take 2.4 g by mouth daily.   traZODone  50 MG tablet Commonly known as: DESYREL  Take 25-50 mg by mouth at bedtime as needed for sleep.  Vitamin D3 50 MCG (2000 UT) Tabs Take 1 tablet by mouth daily.         Follow-up Information     Shelda Atlas, MD. Schedule an appointment as soon as possible for a visit in 1 week(s).   Specialty: Internal Medicine Contact information: [address] [place] [address] [phone]                 Major procedures and Radiology Reports - PLEASE review detailed and final reports thoroughly  -      DG Chest 2 View Result Date: 05/27/2024 EXAM: 2 VIEW(S) XRAY OF THE CHEST 05/27/2024 10:33:30 AM COMPARISON: 05/18/2024 CLINICAL HISTORY: cp FINDINGS: LINES, TUBES AND DEVICES: Right IJ CVC in place  with tip overlying lower right atrium. LUNGS AND PLEURA: Bilateral interstitial opacities are slightly decreased compared to 05/18/24. There are persistent right basilar opacities which may reflect atelectasis versus infection. Trace pleural effusions which are slightly decreased. No pneumothorax. HEART AND MEDIASTINUM: Cardiomegaly, unchanged. Aortic arch atherosclerosis. BONES AND SOFT TISSUES: Post-surgical changes overlying chest. Sternotomy wires noted. No acute osseous abnormality. IMPRESSION: 1. Bilateral interstitial opacities suggestive of pulmonary edema, slightly decreased from prior. 2. Persistent right basilar opacities which may reflect atelectasis or infection. 3. Trace pleural effusions, slightly decreased. 4. Cardiomegaly, unchanged. 5. Right IJ CVC tip overlies the lower right atrium. Electronically signed by: Donnice Mania MD 05/27/2024 12:04 PM EST RP Workstation: [id number]   DG Chest Portable 1 View Result Date: 05/18/2024 EXAM: 1 VIEW(S) XRAY OF THE CHEST 05/18/2024 07:33:00 AM COMPARISON: 02/14/2024 CLINICAL HISTORY: sob FINDINGS: LINES, TUBES AND DEVICES: Right chest tunneled dialysis catheter in place with tip terminating over the right atrium. Surgical clip in right chest. LUNGS AND PLEURA: Small pleural effusions. There are hazy and reticular opacities present diffusely, particularly on the right, suggesting alveolar and interstitial edema. No pneumothorax. HEART AND MEDIASTINUM: Unchanged cardiomegaly. Calcified aorta. BONES AND SOFT TISSUES: Median sternotomy wires noted. No acute osseous abnormality. IMPRESSION: 1. Hazy and reticular opacities, particularly on the right, suggesting alveolar and interstitial edema. 2. Small pleural effusions. Electronically signed by: Evalene Coho MD 05/18/2024 08:10 AM EST RP Workstation: [id number]    Micro Results    No results found for this or any previous visit (from the past 240 hours).  Today   Subjective    Monique Henderson  today has no headache,no chest abdominal pain,no new weakness tingling or numbness, feels much better wants to go home today.    Objective   Blood pressure (!) 168/80, pulse 68, temperature 99 F (37.2 C), resp. rate (!) 25, height 5' 3 (1.6 m), weight 45.2 kg, SpO2 94%.   Intake/Output Summary (Last 24 hours) at 05/30/2024 0911 Last data filed at 05/29/2024 1214 Gross per 24 hour  Intake --  Output 2000 ml  Net -2000 ml    Exam  Awake Alert, No new F.N deficits,    [place].AT,PERRAL Supple Neck,   Symmetrical Chest wall movement, Good air movement bilaterally, CTAB RRR,No Gallops,   +ve B.Sounds, Abd Soft, Non tender,  No Cyanosis, Clubbing or edema    Data Review   Recent Labs  Lab 05/27/24 1051 05/27/24 1055 05/28/24 0604 05/29/24 0356 05/30/24 0848  WBC 6.2  --  4.4 5.0 7.0  HGB 10.1* 11.6* 9.2* 8.8* 8.8*  HCT 33.6* 34.0* 28.6* 28.1* 28.5*  PLT 277  --  235 246 246  MCV 95.2  --  92.0 92.4 95.3  MCH 28.6  --  29.6 28.9 29.4  MCHC 30.1  --  32.2 31.3 30.9  RDW 16.7*  --  16.6* 16.1* 16.0*    Recent Labs  Lab 05/27/24 1051 05/27/24 1055 05/27/24 1255 05/28/24 0604 05/29/24 0356  NA 141 139  --  136 137  K 6.1* 5.7*  --  4.7 5.4*  CL 98 103  --  94* 93*  CO2 25  --   --  27 30  ANIONGAP 19*  --   --  15 14  GLUCOSE 111* 106*  --  86 103*  BUN 62* 60*  --  34* 59*  CREATININE 11.60* 12.80*  --  7.05* 9.49*  ALBUMIN   --   --   --   --  3.9  INR  --   --  1.3*  --   --   MG  --   --  1.1* 2.1  --   PHOS  --   --  3.1 6.2* 7.1*  CALCIUM  9.8  --   --  9.0 8.9    Total Time in preparing paper work, data evaluation and todays exam - 35 minutes  Signature  -    Lavada Stank M.D on 05/30/2024 at 9:11 AM   -  To page go to [email or web address]      "

## 2024-05-30 NOTE — Plan of Care (Signed)

## 2024-05-30 NOTE — Progress Notes (Signed)
 DISCHARGE NOTE HOME MELADY CHOW to be discharged Home per MD order. Discussed prescriptions and follow up appointments with the patient. Prescriptions given to patient; medication list explained in detail. Patient verbalized understanding.  Skin clean, dry and intact without evidence of skin break down, no evidence of skin tears noted. IV catheter discontinued intact. Site without signs and symptoms of complications. Dressing and pressure applied. Pt denies pain at the site currently. No complaints noted.  Patient free of lines, drains, and wounds. Sent home with tank in the room.  An After Visit Summary (AVS) was printed and given to the patient. Patient escorted via wheelchair, and discharged home via private auto.  Peyton SHAUNNA Pepper, RN

## 2024-05-30 NOTE — Progress Notes (Signed)
 Came in awake and oriented with no complaints Consent verified and on file. Started with no complaints  UF goal: Tx duration: 3 hours  Access used: Right CVC Access issue: None  Dressing change due to dressing starting to peel off.  Dressing change due: 06/06/2024  Tx completed and tolerated. Catheter dwelled and locked with heparin . Endorsed to floor nurse. Transported to room.  Sevanna Ballengee Rubi Exa Bomba, RN [facility]

## 2024-05-30 NOTE — Progress Notes (Signed)
 D/C order noted. Contacted [facility] to be advised of pt's d/c today and that pt should resume care on Thursday.   Randine Mungo Dialysis Navigator (coverage) [phone]

## 2024-05-30 NOTE — Progress Notes (Signed)
 [facility] Progress Note  Name: Monique Henderson MRN: [id number] DOB: December 18, 1950   Subjective:  Seen and examined on dialysis.  Procedure supervised.  Blood pressure 121/76 and HR 72.  Tolerating goal.  Tunn catheter in use.  She's eager to go home     Review of systems:    Denies shortness of breath or chest pain; on oxygen  at home but states she's not sure how much  Denies n/v  No dizziness or cramping   -----------------  Background on consult:  Monique Henderson is an 73 y.o. female with ESRD on HD, HTN, HLD, PAFib, prior CVA with hemiparesis, COPD on chronic O2 admitted with hypoxic respiratory failure, recently discharged from [facility] on 12/19 with dyspnea. She was sent home with oxygen  but ran out of oxygen  at home and had not had any tanks delivered.  CXR showing pulmonary edema.  Labs notable for Na 141, K 6.1, BUN 62, Pro BNP > 35K, hypertensive and tachycardic on arrival. On 4L [place]. Oriented x 2. Denies chest pain initially then says she is having chest pain. Last dialysis Wed for 2:50 mins. Left 5kg over dry weight.    Intake/Output Summary (Last 24 hours) at 05/30/2024 0929 Last data filed at 05/29/2024 1214 Gross per 24 hour  Intake --  Output 2000 ml  Net -2000 ml    Vitals:  Vitals:   05/30/24 0740 05/30/24 0820 05/30/24 0831 05/30/24 0900  BP: (!) 165/86 (!) 161/77 (!) 168/75 (!) 168/80  Pulse: 75 72 71 68  Resp: 18 16 20  (!) 25  Temp: 99.6 F (37.6 C) 99 F (37.2 C)    TempSrc: Oral     SpO2:  94% 91% 94%  Weight:  45.2 kg    Height:         Physical Exam:    General elderly female in bed in no acute distress HEENT normocephalic atraumatic extraocular movements intact sclera anicteric Neck supple trachea midline Lungs clear to auscultation bilaterally normal work of breathing at rest on room air Heart S1S2 no rub irregular Abdomen soft nontender nondistended Extremities no edema  Psych normal mood and affect Neuro - alert and  conversant  Access RIJ tunn catheter in use    Medications reviewed   Labs:     Latest Ref Rng & Units 05/29/2024    3:56 AM 05/28/2024    6:04 AM 05/27/2024   10:55 AM  BMP  Glucose 70 - 99 mg/dL 896  86  893   BUN 8 - 23 mg/dL 59  34  60   Creatinine 0.44 - 1.00 mg/dL 0.50  2.94  87.19   Sodium 135 - 145 mmol/L 137  136  139   Potassium 3.5 - 5.1 mmol/L 5.4  4.7  5.7   Chloride 98 - 111 mmol/L 93  94  103   CO2 22 - 32 mmol/L 30  27    Calcium  8.9 - 10.3 mg/dL 8.9  9.0      Dialysis Orders:  Unit: AF Schedule: TTS Time: 3:30  EDW: 46 kg  Flows: 400  [place]: 2K/2Ca Access: TDC Heparin : None  ESA: None (rec'd mircera 50 mcg on 12/2) VDRA: Hectorol  7 q HD, Cinacalcet  60 q HD She left 5kg above her EDW on 12/24.      Assessment/Plan:    Acute on chronic hypoxic respiratory failure 2/2 volume overload. Improved with serial HD Chronic oxygen  requirement of 2-3 liters per charting.  On room air now.  Pt with COPD.  Have reached out to primary team  ESRD   HD per TTS schedule.  After today, she would get HD thurs at her usual center, [facility]  Hyperkalemia. S/p Lokelma .   Would continue with lokelma  three times a week - may need rx for this  Hypertension  Improved  Optimize volume status with HD and continue current regimen  Note concurrent afib - rate-controlling agents per primary team  Volume. Volume overload as above. 5 kg over EDW on 12/24.  S/p serial HD to optimize Anemia of CKD.  Started aranesp  60 mcg weekly on Mondays for now.  Metabolic bone disease. Hyperphos. Resumed renvela  packets.  On sensipar  with HD - continue      Disposition - per primary team  Katheryn JAYSON Saba, MD 05/30/2024 9:45 AM

## 2024-05-31 ENCOUNTER — Telehealth: Payer: Self-pay | Admitting: Nephrology

## 2024-05-31 NOTE — Telephone Encounter (Signed)
 Transition of care contact from inpatient facility  Date of Discharge: 05/30/2024 Date of Contact: 05/31/2024 Method of contact: Phone  Attempted to contact patient to discuss transition of care from inpatient admission. Patient did not answer the phone. Message was left on the patient's voicemail with call back number [phone].   Izetta Boehringer, PA-C [employer] Pager [phone]

## 2024-06-15 ENCOUNTER — Encounter (HOSPITAL_COMMUNITY): Payer: Self-pay

## 2024-06-15 ENCOUNTER — Other Ambulatory Visit: Payer: Self-pay

## 2024-06-15 ENCOUNTER — Emergency Department (HOSPITAL_COMMUNITY)

## 2024-06-15 ENCOUNTER — Emergency Department (HOSPITAL_COMMUNITY)
Admission: EM | Admit: 2024-06-15 | Discharge: 2024-06-15 | Disposition: A | Discharge status: Home / Self Care | Attending: Emergency Medicine | Admitting: Emergency Medicine

## 2024-06-15 DIAGNOSIS — R1084 Generalized abdominal pain: Secondary | ICD-10-CM | POA: Diagnosis not present

## 2024-06-15 DIAGNOSIS — Z79899 Other long term (current) drug therapy: Secondary | ICD-10-CM | POA: Diagnosis not present

## 2024-06-15 DIAGNOSIS — R0602 Shortness of breath: Secondary | ICD-10-CM | POA: Insufficient documentation

## 2024-06-15 DIAGNOSIS — I120 Hypertensive chronic kidney disease with stage 5 chronic kidney disease or end stage renal disease: Secondary | ICD-10-CM | POA: Insufficient documentation

## 2024-06-15 DIAGNOSIS — Z7901 Long term (current) use of anticoagulants: Secondary | ICD-10-CM | POA: Insufficient documentation

## 2024-06-15 DIAGNOSIS — N186 End stage renal disease: Secondary | ICD-10-CM | POA: Insufficient documentation

## 2024-06-15 DIAGNOSIS — Z91040 Latex allergy status: Secondary | ICD-10-CM | POA: Diagnosis not present

## 2024-06-15 DIAGNOSIS — Z992 Dependence on renal dialysis: Secondary | ICD-10-CM | POA: Insufficient documentation

## 2024-06-15 DIAGNOSIS — Z8673 Personal history of transient ischemic attack (TIA), and cerebral infarction without residual deficits: Secondary | ICD-10-CM | POA: Insufficient documentation

## 2024-06-15 DIAGNOSIS — R109 Unspecified abdominal pain: Secondary | ICD-10-CM

## 2024-06-15 LAB — CBC WITH DIFFERENTIAL/PLATELET
Abs Immature Granulocytes: 0.03 K/uL (ref 0.00–0.07)
Basophils Absolute: 0.1 K/uL (ref 0.0–0.1)
Basophils Relative: 1 %
Eosinophils Absolute: 0.2 K/uL (ref 0.0–0.5)
Eosinophils Relative: 3 %
HCT: 32.7 % — ABNORMAL LOW (ref 36.0–46.0)
Hemoglobin: 9.9 g/dL — ABNORMAL LOW (ref 12.0–15.0)
Immature Granulocytes: 0 %
Lymphocytes Relative: 14 %
Lymphs Abs: 1.1 K/uL (ref 0.7–4.0)
MCH: 29.8 pg (ref 26.0–34.0)
MCHC: 30.3 g/dL (ref 30.0–36.0)
MCV: 98.5 fL (ref 80.0–100.0)
Monocytes Absolute: 0.8 K/uL (ref 0.1–1.0)
Monocytes Relative: 10 %
Neutro Abs: 5.8 K/uL (ref 1.7–7.7)
Neutrophils Relative %: 72 %
Platelets: 235 K/uL (ref 150–400)
RBC: 3.32 MIL/uL — ABNORMAL LOW (ref 3.87–5.11)
RDW: 19.2 % — ABNORMAL HIGH (ref 11.5–15.5)
WBC: 8.1 K/uL (ref 4.0–10.5)
nRBC: 0 % (ref 0.0–0.2)

## 2024-06-15 LAB — COMPREHENSIVE METABOLIC PANEL WITH GFR
ALT: <5 U/L (ref 0–44)
AST: 24 U/L (ref 15–41)
Albumin: 4.1 g/dL (ref 3.5–5.0)
Alkaline Phosphatase: 75 U/L (ref 38–126)
Anion gap: 16 — ABNORMAL HIGH (ref 5–15)
BUN: 36 mg/dL — ABNORMAL HIGH (ref 8–23)
CO2: 26 mmol/L (ref 22–32)
Calcium: 9.1 mg/dL (ref 8.9–10.3)
Chloride: 99 mmol/L (ref 98–111)
Creatinine, Ser: 9.15 mg/dL — ABNORMAL HIGH (ref 0.44–1.00)
GFR, Estimated: 4 mL/min — ABNORMAL LOW
Glucose, Bld: 87 mg/dL (ref 70–99)
Potassium: 5.8 mmol/L — ABNORMAL HIGH (ref 3.5–5.1)
Sodium: 141 mmol/L (ref 135–145)
Total Bilirubin: 0.4 mg/dL (ref 0.0–1.2)
Total Protein: 7.5 g/dL (ref 6.5–8.1)

## 2024-06-15 LAB — TROPONIN T, HIGH SENSITIVITY
Troponin T High Sensitivity: 116 ng/L (ref 0–19)
Troponin T High Sensitivity: 110 ng/L (ref 0–19)

## 2024-06-15 LAB — I-STAT CHEM 8, ED
BUN: 40 mg/dL — ABNORMAL HIGH (ref 8–23)
Calcium, Ion: 1.05 mmol/L — ABNORMAL LOW (ref 1.15–1.40)
Chloride: 101 mmol/L (ref 98–111)
Creatinine, Ser: 10.1 mg/dL — ABNORMAL HIGH (ref 0.44–1.00)
Glucose, Bld: 93 mg/dL (ref 70–99)
HCT: 33 % — ABNORMAL LOW (ref 36.0–46.0)
Hemoglobin: 11.2 g/dL — ABNORMAL LOW (ref 12.0–15.0)
Potassium: 5.4 mmol/L — ABNORMAL HIGH (ref 3.5–5.1)
Sodium: 140 mmol/L (ref 135–145)
TCO2: 28 mmol/L (ref 22–32)

## 2024-06-15 LAB — RESP PANEL BY RT-PCR (RSV, FLU A&B, COVID)  RVPGX2
Influenza A by PCR: NEGATIVE
Influenza B by PCR: NEGATIVE
Resp Syncytial Virus by PCR: NEGATIVE
SARS Coronavirus 2 by RT PCR: NEGATIVE

## 2024-06-15 LAB — HEPATITIS B SURFACE ANTIGEN: Hepatitis B Surface Ag: NONREACTIVE

## 2024-06-15 LAB — PRO BRAIN NATRIURETIC PEPTIDE: Pro Brain Natriuretic Peptide: >35000 pg/mL — ABNORMAL HIGH

## 2024-06-15 LAB — LIPASE, BLOOD: Lipase: 31 U/L (ref 11–51)

## 2024-06-15 MED ORDER — IOHEXOL 350 MG/ML SOLN
75.0000 mL | Freq: Once | INTRAVENOUS | Status: AC | PRN
  Administered 2024-06-15: 75 mL via INTRAVENOUS

## 2024-06-15 MED ORDER — FAMOTIDINE IN NACL 20-0.9 MG/50ML-% IV SOLN
20.0000 mg | Freq: Once | INTRAVENOUS | Status: AC
  Administered 2024-06-15: 20 mg via INTRAVENOUS
  Filled 2024-06-15: qty 50

## 2024-06-15 MED ORDER — METOCLOPRAMIDE HCL 5 MG/ML IJ SOLN
5.0000 mg | Freq: Once | INTRAMUSCULAR | Status: AC
  Administered 2024-06-15: 5 mg via INTRAVENOUS
  Filled 2024-06-15: qty 2

## 2024-06-15 MED ORDER — ALUM & MAG HYDROXIDE-SIMETH 200-200-20 MG/5ML PO SUSP
30.0000 mL | Freq: Once | ORAL | Status: AC
  Administered 2024-06-15: 30 mL via ORAL
  Filled 2024-06-15: qty 30

## 2024-06-15 MED ORDER — FENTANYL CITRATE (PF) 50 MCG/ML IJ SOSY
50.0000 ug | PREFILLED_SYRINGE | Freq: Once | INTRAMUSCULAR | Status: AC
  Administered 2024-06-15: 50 ug via INTRAVENOUS
  Filled 2024-06-15: qty 1

## 2024-06-15 NOTE — ED Notes (Signed)
 EDP made aware of difficulty establishing IV access. Waiting for IV team.

## 2024-06-15 NOTE — ED Provider Triage Note (Signed)
 Emergency Medicine Provider Triage Evaluation Note  Monique Henderson , a 74 y.o. female  was evaluated in triage.  Pt complains of dyspnea.  ESRD TTH S, did not get dialysis today.  Reports of having difficulty breathing with past 24 hours, unable to sleep last night because she could not breathe.  She is on 2 L nasal cannula normally.  Reports she increased her oxygen .  Has been coughing, nonproductive.  No fevers.  Reports she feels similar to when she was admitted last month  Review of Systems  Positive: Dyspnea Negative: Fever  Physical Exam  BP (!) 186/108   Pulse 95   Temp 98 F (36.7 C) (Oral)   Resp 16   SpO2 97%  Gen:   Awake, no distress   Resp:  Tachypnea MSK:   Moves extremities without difficulty  Other:  HD cath chest wall right  Medical Decision Making  Medically screening exam initiated at 1:01 PM.  Appropriate orders placed.  LLESENIA FOGAL was informed that the remainder of the evaluation will be completed by another provider, this initial triage assessment does not replace that evaluation, and the importance of remaining in the ED until their evaluation is complete.     Elnor Jayson LABOR, DO 06/15/24 1302

## 2024-06-15 NOTE — Progress Notes (Cosign Needed)
 Ms. Weed presents to the ED c/o SOB, nausea, and diarrhea. Seen and examined patient at bedside in the ED. Her breathing is mildly labored but not in acute respiratory distress. IV team obtaining her labs now. CXR showed mild vascular congestion. She missed HD today. Plan for HD tonight. Will do an official consult if she is admitted.  Charmaine Piety, NP [facility]

## 2024-06-15 NOTE — Discharge Instructions (Signed)
 Thank you for letting us  take care of you today.  You came to us  for evaluation of abdominal pain.  We did blood work that was overall reassuring.  We also did a CT scan did not show any acute abnormalities.  We do think you need dialysis and we recommend that you call the dialysis center tomorrow to make up your missed session.  If you have any worsening symptoms including worsening pain, uncontrollable nausea, vomiting, fevers, chills, worsening chest pain or shortness of breath please come back for reevaluation.

## 2024-06-15 NOTE — ED Provider Notes (Signed)
 " [facility] Provider Note   CSN: [id number] Arrival date & time: 06/15/24  1250     Patient presents with: Abdominal Pain and Shortness of Breath   Monique Henderson is a 74 y.o. female.   Monique Henderson is a 74 y.o. female with a history of ESRD on HD (TThS), hypertension, stroke, paroxysmal A-fib, who presents to the emergency department for evaluation of abdominal pain and shortness of breath.  Pain started yesterday and worsened throughout the night keeping her up.  She was supposed to go to dialysis treatment today but did not due to the symptoms.  Reports generalized abdominal pain that does not localize to 1 quadrant.  Reports 3 episodes of diarrhea that has been nonbloody.  Reports feeling nauseated but no vomiting.  Reports abdominal pain has radiated up into her chest sometimes.  She denies cough.  She also reports feeling increasingly short of breath this morning and during the night.  She has at home oxygen  that she uses as needed.  EMS applied oxygen  due to tachypnea but did not document hypoxia.  She denies any recently missed dialysis treatments.  No fevers, chills or recent sick contacts.  No other aggravating or alleviating factors.  Reports taking Eliquis  and has not missed any recent doses.  The history is provided by the patient and medical records.  Abdominal Pain Associated symptoms: chest pain, diarrhea, nausea and shortness of breath   Associated symptoms: no chills, no cough, no fever and no vomiting        Prior to Admission medications  Medication Sig Start Date End Date Taking? Authorizing Provider  acetaminophen  (TYLENOL ) 325 MG tablet Take 650 mg by mouth every 6 (six) hours as needed for mild pain (pain score 1-3) or moderate pain (pain score 4-6).    [provider]  albuterol  (PROVENTIL ) (2.5 MG/3ML) 0.083% nebulizer solution Take 3 mLs (2.5 mg total) by nebulization every 6 (six) hours as needed  for wheezing or shortness of breath. 08/23/23   Pokhrel, Vernal, MD  albuterol  (VENTOLIN  HFA) 108 (90 Base) MCG/ACT inhaler Inhale 2 puffs into the lungs every 6 (six) hours as needed for shortness of breath. 10/02/23   [provider]  amLODipine  (NORVASC ) 10 MG tablet Take 1 tablet (10 mg total) by mouth daily. 03/06/22   Setzer, Nena PARAS, PA-C  apixaban  (ELIQUIS ) 2.5 MG TABS tablet Take 1 tablet (2.5 mg total) by mouth 2 (two) times daily. 03/06/22   Setzer, Sandra J, PA-C  Blood Pressure Monitoring (OMRON 3 SERIES BP MONITOR) DEVI Use as directed. 03/29/24   Tolia, Sunit, DO  carvedilol  (COREG ) 25 MG tablet Take 1 tablet (25 mg total) by mouth 2 (two) times daily with a meal. 05/01/22 06/16/24  Ghimire, Donalda HERO, MD  Cholecalciferol  (VITAMIN D3) 50 MCG (2000 UT) TABS Take 1 tablet by mouth daily. 04/21/24   [provider]  cinacalcet  (SENSIPAR ) 60 MG tablet Take 60 mg by mouth every dialysis (Tuesday, Thursday, and Saturday).    [provider]  cloNIDine  (CATAPRES  - DOSED IN MG/24 HR) 0.2 mg/24hr patch Place 0.2 mg onto the skin once a week. 02/25/24   [provider]  hydrALAZINE  (APRESOLINE ) 100 MG tablet Take 1 tablet (100 mg total) by mouth every 8 (eight) hours. 12/18/22   Dann Candyce RAMAN, MD  isosorbide  mononitrate (IMDUR ) 30 MG 24 hr tablet TAKE 1 TABLET (30MG ) ON DIALYSIS DAYS AND 2 TABLETS (60 MG) ON NON DIALYSIS  DAYS. 12/22/23   Tolia, Sunit, DO  LOKELMA  10 g PACK packet Take 1 packet (10 g) by mouth every Monday, Wednesday, and Friday. 05/31/24   Singh, Prashant K, MD  naloxone  (NARCAN ) nasal spray 4 mg/0.1 mL Place 1 spray into the nose as needed (opioid overdose). 02/19/22   [provider]  oxyCODONE -acetaminophen  (PERCOCET) 10-325 MG tablet Take 1 tablet by mouth every 8 (eight) hours as needed for pain.    [provider]  pantoprazole  (PROTONIX ) 40 MG tablet TAKE 1 TABLET (40 MG TOTAL) BY MOUTH 2 (TWO) TIMES DAILY. 05/13/23    Tanda Bleacher, MD  rosuvastatin  (CRESTOR ) 10 MG tablet TAKE 1 TABLET (10 MG TOTAL) BY MOUTH DAILY FOR CHOLESTEROL 08/07/22   Tanda Bleacher, MD  sevelamer  carbonate (RENVELA ) 2.4 g PACK Take 2.4 g by mouth daily.    [provider]  traZODone  (DESYREL ) 50 MG tablet Take 25-50 mg by mouth at bedtime as needed for sleep. 03/19/23   [provider]    Allergies: Shrimp [shellfish allergy], Bactroban  [mupirocin ], Hydrocodone , Chlorhexidine  gluconate, Latex, Tylenol  [acetaminophen ], and Zestril  [lisinopril ]    Review of Systems  Constitutional:  Negative for chills and fever.  HENT: Negative.    Respiratory:  Positive for shortness of breath. Negative for cough.   Cardiovascular:  Positive for chest pain.  Gastrointestinal:  Positive for abdominal pain, diarrhea and nausea. Negative for vomiting.  Musculoskeletal:  Negative for arthralgias and myalgias.  Neurological:  Negative for dizziness, syncope and light-headedness.  All other systems reviewed and are negative.   Updated Vital Signs BP (!) 186/108   Pulse 95   Temp 98 F (36.7 C) (Oral)   Resp 16   SpO2 97%   Physical Exam Vitals and nursing note reviewed.  Constitutional:      General: She is not in acute distress.    Appearance: Normal appearance. She is well-developed. She is not ill-appearing or diaphoretic.  HENT:     Head: Normocephalic and atraumatic.     Mouth/Throat:     Mouth: Mucous membranes are moist.     Pharynx: Oropharynx is clear.  Eyes:     General:        Right eye: No discharge.        Left eye: No discharge.     Pupils: Pupils are equal, round, and reactive to light.  Cardiovascular:     Rate and Rhythm: Normal rate and regular rhythm.     Pulses: Normal pulses.     Heart sounds: Normal heart sounds.  Pulmonary:     Effort: Pulmonary effort is normal. No respiratory distress.     Breath sounds: Normal breath sounds. No wheezing or rales.     Comments: Respirations equal and  unlabored, patient able to speak in full sentences, lungs clear to auscultation bilaterally  Abdominal:     General: Bowel sounds are normal. There is no distension.     Palpations: Abdomen is soft. There is no mass.     Tenderness: There is no abdominal tenderness. There is no guarding.     Comments: Abdomen soft, nondistended, nontender to palpation in all quadrants without guarding or peritoneal signs  Musculoskeletal:        General: No deformity.     Cervical back: Neck supple.  Skin:    General: Skin is warm and dry.     Capillary Refill: Capillary refill takes less than 2 seconds.  Neurological:     Mental Status: She is alert and  oriented to person, place, and time.     Coordination: Coordination normal.     Comments: Speech is clear, able to follow commands Normal strength in upper and lower extremities bilaterally including dorsiflexion and plantar flexion, strong and equal grip strength Moves extremities without ataxia  Psychiatric:        Mood and Affect: Mood normal.        Behavior: Behavior normal.     (all labs ordered are listed, but only abnormal results are displayed) Labs Reviewed  RESP PANEL BY RT-PCR (RSV, FLU A&B, COVID)  [id number]  CBC WITH DIFFERENTIAL/PLATELET  PRO BRAIN NATRIURETIC PEPTIDE  COMPREHENSIVE METABOLIC PANEL WITH GFR  LIPASE, BLOOD  I-STAT CHEM 8, ED  TROPONIN T, HIGH SENSITIVITY    EKG: None  Radiology: [facility] Chest Port 1 View Result Date: 06/15/2024 CLINICAL DATA:  Abdominal pain with some nausea and diarrhea. EXAM: PORTABLE CHEST 1 VIEW COMPARISON:  05/27/2024 FINDINGS: Right IJ dialysis catheter unchanged with tip over the right atrium. Lungs are adequately inflated without focal lobar consolidation or effusion. Minimal linear atelectasis left midlung. Mild hazy prominence of the central pulmonary vessels suggesting mild degree of vascular congestion. Mild cardiomegaly. Remainder the exam is unchanged. IMPRESSION: 1. Mild cardiomegaly with  suggestion of mild vascular congestion. 2. Minimal linear atelectasis left midlung. Electronically Signed   By: Toribio Agreste M.D.   On: 06/15/2024 13:51     Procedures   Medications Ordered in the ED  fentaNYL  (SUBLIMAZE ) injection 50 mcg (has no administration in time range)  metoCLOPramide  (REGLAN ) injection 5 mg (has no administration in time range)                                  Medical Decision Making  74 y.o. female presents to the ED with complaints of abdominal pain radiating into the chest and shortness of breath, this involves an extensive number of treatment options, and is a complaint that carries with it a high risk of complications and morbidity.  The differential diagnosis includes gastroenteritis, peptic ulcer disease, GERD, fluid overload, ACS, electrolyte derangement, pneumonia, peritonitis, obstruction, perforation.  On arrival pt is nontoxic, vitals significant for elevated blood pressure but vitals otherwise normal. Exam significant for mild generalized abdominal tenderness to that does not localize, patient tachypneic but lungs are clear.  Previous records obtained and reviewed   I ordered medication including fentanyl  and Reglan  for pain and nausea   Lab Tests:  Laboratory evaluation initiated including CBC, CMP, lipase, troponin, proBNP and COVID/flu testing.  Labs currently pending  Imaging Studies ordered:  I ordered imaging studies which included chest x-ray, I independently visualized and interpreted imaging which showed mild cardiomegaly with some slight vascular congestion but otherwise no significant cardiopulmonary findings  ED Course:   At shift change laboratory evaluation is pending, care signed out to Dr. Toribio Mana who will follow up on pending labs and CT abdomen pelvis and re-evaluate after medications.   Portions of this note were generated with Scientist, clinical (histocompatibility and immunogenetics). Dictation errors may occur despite best attempts at  proofreading.      Final diagnoses:  Shortness of breath  Generalized abdominal pain    ED Discharge Orders     None          Alva Larraine JULIANNA DEVONNA 06/15/24 1530    Dasie Faden, MD 06/18/24 [phone]  "

## 2024-06-15 NOTE — ED Provider Notes (Signed)
" °  Physical Exam  BP (!) 162/115 (BP Location: Left Arm)   Pulse 98   Temp 97.7 F (36.5 C) (Oral)   Resp (!) 30   SpO2 100%   Physical Exam HENT:     Head: Normocephalic.     Mouth/Throat:     Mouth: Mucous membranes are moist.  Eyes:     Extraocular Movements: Extraocular movements intact.  Cardiovascular:     Rate and Rhythm: Normal rate and regular rhythm.  Pulmonary:     Effort: Pulmonary effort is normal.  Abdominal:     General: Abdomen is flat.     Palpations: Abdomen is soft.     Tenderness: There is no abdominal tenderness.  Skin:    General: Skin is warm and dry.  Neurological:     Mental Status: She is alert.     Procedures  Procedures  ED Course / MDM   Clinical Course as of 06/15/24 1857  Thu Jun 15, 2024  1516 S - ESRD missed HD today. Unclear duration of abdominal pain and SOB. Diarrhea without emesis. No missed HD [DZ]    Clinical Course User Index [DZ] Laurita Sieving, MD   Medical Decision Making Amount and/or Complexity of Data Reviewed Labs: ordered. Radiology: ordered.  Risk OTC drugs. Prescription drug management.   Patient is a 74 year old female who presents today for evaluation of abdominal pain as well as chest pain.   Patient's laboratory evaluation without any acute abnormalities on CBC.  Metabolic panel consistent with ESRD.  Patient had a potassium of 5.8 with moderate hemolysis however potassium 5.4 on VBG with EKG without any new changes.  Patient does have an elevated troponin of 110 as well as undetectably high BNP.  Both of these are similar to prior.  Patient had CT abdomen pelvis without any acute findings.  On reassessment patient remained including hemodynamically stable.  Reports complete resolution of her symptoms.  Is comfortable going home at this point in time.  Recommended calling a hemodialysis center tomorrow for dialysis given that she missed her session today.  No evidence of any acute process contributing to her  current presentation.  Return precautions discussed.   Laurita Sieving, MD 06/15/24 2324    Garrick Charleston, MD 06/16/24 [phone]  "

## 2024-06-15 NOTE — ED Notes (Signed)
 Son called and is on the way to pick her up

## 2024-06-15 NOTE — ED Triage Notes (Addendum)
 Pt BIB EMS for abdominal pain starting this AM, with some diarrhea and nausea no vomiting. Pt due for dialysis this morning but did not go due to pain. Has not missed any treatments. Right sided fistula. Hx of HTN, pt has taken meds today.  At home Oxygen  as needed  Was wearing 6L on EMS arrival, sating 98% 190/110 100

## 2024-06-16 ENCOUNTER — Encounter (HOSPITAL_COMMUNITY): Payer: Self-pay

## 2024-06-16 ENCOUNTER — Emergency Department (HOSPITAL_COMMUNITY)

## 2024-06-16 ENCOUNTER — Observation Stay (HOSPITAL_COMMUNITY)
Admission: EM | Admit: 2024-06-16 | Discharge: 2024-06-18 | Disposition: A | Attending: Family Medicine | Admitting: Family Medicine

## 2024-06-16 ENCOUNTER — Other Ambulatory Visit: Payer: Self-pay

## 2024-06-16 DIAGNOSIS — D649 Anemia, unspecified: Secondary | ICD-10-CM | POA: Diagnosis not present

## 2024-06-16 DIAGNOSIS — I480 Paroxysmal atrial fibrillation: Secondary | ICD-10-CM | POA: Insufficient documentation

## 2024-06-16 DIAGNOSIS — R1084 Generalized abdominal pain: Secondary | ICD-10-CM | POA: Diagnosis not present

## 2024-06-16 DIAGNOSIS — I132 Hypertensive heart and chronic kidney disease with heart failure and with stage 5 chronic kidney disease, or end stage renal disease: Secondary | ICD-10-CM | POA: Insufficient documentation

## 2024-06-16 DIAGNOSIS — G4700 Insomnia, unspecified: Secondary | ICD-10-CM | POA: Insufficient documentation

## 2024-06-16 DIAGNOSIS — K219 Gastro-esophageal reflux disease without esophagitis: Secondary | ICD-10-CM | POA: Diagnosis not present

## 2024-06-16 DIAGNOSIS — Z7901 Long term (current) use of anticoagulants: Secondary | ICD-10-CM | POA: Insufficient documentation

## 2024-06-16 DIAGNOSIS — Z91040 Latex allergy status: Secondary | ICD-10-CM | POA: Insufficient documentation

## 2024-06-16 DIAGNOSIS — E8770 Fluid overload, unspecified: Principal | ICD-10-CM | POA: Diagnosis present

## 2024-06-16 DIAGNOSIS — J9611 Chronic respiratory failure with hypoxia: Secondary | ICD-10-CM | POA: Diagnosis not present

## 2024-06-16 DIAGNOSIS — I5043 Acute on chronic combined systolic (congestive) and diastolic (congestive) heart failure: Secondary | ICD-10-CM | POA: Diagnosis not present

## 2024-06-16 DIAGNOSIS — Z8673 Personal history of transient ischemic attack (TIA), and cerebral infarction without residual deficits: Secondary | ICD-10-CM | POA: Insufficient documentation

## 2024-06-16 DIAGNOSIS — J189 Pneumonia, unspecified organism: Secondary | ICD-10-CM | POA: Insufficient documentation

## 2024-06-16 DIAGNOSIS — I160 Hypertensive urgency: Secondary | ICD-10-CM | POA: Diagnosis not present

## 2024-06-16 DIAGNOSIS — R9431 Abnormal electrocardiogram [ECG] [EKG]: Secondary | ICD-10-CM | POA: Insufficient documentation

## 2024-06-16 DIAGNOSIS — R112 Nausea with vomiting, unspecified: Secondary | ICD-10-CM | POA: Insufficient documentation

## 2024-06-16 DIAGNOSIS — E785 Hyperlipidemia, unspecified: Secondary | ICD-10-CM | POA: Insufficient documentation

## 2024-06-16 DIAGNOSIS — J449 Chronic obstructive pulmonary disease, unspecified: Secondary | ICD-10-CM | POA: Diagnosis not present

## 2024-06-16 DIAGNOSIS — I509 Heart failure, unspecified: Secondary | ICD-10-CM

## 2024-06-16 DIAGNOSIS — R0602 Shortness of breath: Secondary | ICD-10-CM | POA: Diagnosis present

## 2024-06-16 DIAGNOSIS — N186 End stage renal disease: Secondary | ICD-10-CM | POA: Diagnosis not present

## 2024-06-16 DIAGNOSIS — E875 Hyperkalemia: Secondary | ICD-10-CM | POA: Insufficient documentation

## 2024-06-16 DIAGNOSIS — Z79899 Other long term (current) drug therapy: Secondary | ICD-10-CM | POA: Diagnosis not present

## 2024-06-16 DIAGNOSIS — Z853 Personal history of malignant neoplasm of breast: Secondary | ICD-10-CM | POA: Diagnosis not present

## 2024-06-16 DIAGNOSIS — J810 Acute pulmonary edema: Secondary | ICD-10-CM | POA: Insufficient documentation

## 2024-06-16 DIAGNOSIS — Z992 Dependence on renal dialysis: Secondary | ICD-10-CM | POA: Diagnosis not present

## 2024-06-16 DIAGNOSIS — I4891 Unspecified atrial fibrillation: Secondary | ICD-10-CM | POA: Insufficient documentation

## 2024-06-16 DIAGNOSIS — Z87891 Personal history of nicotine dependence: Secondary | ICD-10-CM | POA: Diagnosis not present

## 2024-06-16 DIAGNOSIS — I4719 Other supraventricular tachycardia: Secondary | ICD-10-CM

## 2024-06-16 LAB — POCT I-STAT, CHEM 8
BUN: 52 mg/dL — ABNORMAL HIGH (ref 8–23)
Calcium, Ion: 0.96 mmol/L — ABNORMAL LOW (ref 1.15–1.40)
Chloride: 102 mmol/L (ref 98–111)
Creatinine, Ser: 12.8 mg/dL — ABNORMAL HIGH (ref 0.44–1.00)
Glucose, Bld: 131 mg/dL — ABNORMAL HIGH (ref 70–99)
HCT: 31 % — ABNORMAL LOW (ref 36.0–46.0)
Hemoglobin: 10.5 g/dL — ABNORMAL LOW (ref 12.0–15.0)
Potassium: 6.9 mmol/L (ref 3.5–5.1)
Sodium: 135 mmol/L (ref 135–145)
TCO2: 25 mmol/L (ref 22–32)

## 2024-06-16 LAB — CBC WITH DIFFERENTIAL/PLATELET
Abs Immature Granulocytes: 0.02 K/uL (ref 0.00–0.07)
Basophils Absolute: 0.1 K/uL (ref 0.0–0.1)
Basophils Relative: 1 %
Eosinophils Absolute: 0 K/uL (ref 0.0–0.5)
Eosinophils Relative: 0 %
HCT: 30.4 % — ABNORMAL LOW (ref 36.0–46.0)
Hemoglobin: 9.2 g/dL — ABNORMAL LOW (ref 12.0–15.0)
Immature Granulocytes: 0 %
Lymphocytes Relative: 12 %
Lymphs Abs: 0.9 K/uL (ref 0.7–4.0)
MCH: 29.3 pg (ref 26.0–34.0)
MCHC: 30.3 g/dL (ref 30.0–36.0)
MCV: 96.8 fL (ref 80.0–100.0)
Monocytes Absolute: 0.6 K/uL (ref 0.1–1.0)
Monocytes Relative: 8 %
Neutro Abs: 5.6 K/uL (ref 1.7–7.7)
Neutrophils Relative %: 79 %
Platelets: 216 K/uL (ref 150–400)
RBC: 3.14 MIL/uL — ABNORMAL LOW (ref 3.87–5.11)
RDW: 18.7 % — ABNORMAL HIGH (ref 11.5–15.5)
WBC: 7.2 K/uL (ref 4.0–10.5)
nRBC: 0 % (ref 0.0–0.2)

## 2024-06-16 LAB — BASIC METABOLIC PANEL WITH GFR
Anion gap: 18 — ABNORMAL HIGH (ref 5–15)
BUN: 52 mg/dL — ABNORMAL HIGH (ref 8–23)
CO2: 23 mmol/L (ref 22–32)
Calcium: 9.6 mg/dL (ref 8.9–10.3)
Chloride: 95 mmol/L — ABNORMAL LOW (ref 98–111)
Creatinine, Ser: 11.2 mg/dL — ABNORMAL HIGH (ref 0.44–1.00)
GFR, Estimated: 3 mL/min — ABNORMAL LOW
Glucose, Bld: 131 mg/dL — ABNORMAL HIGH (ref 70–99)
Potassium: 7.2 mmol/L (ref 3.5–5.1)
Sodium: 137 mmol/L (ref 135–145)

## 2024-06-16 LAB — CG4 I-STAT (LACTIC ACID): Lactic Acid, Venous: 1.8 mmol/L (ref 0.5–1.9)

## 2024-06-16 LAB — RESP PANEL BY RT-PCR (RSV, FLU A&B, COVID)  RVPGX2
Influenza A by PCR: NEGATIVE
Influenza B by PCR: NEGATIVE
Resp Syncytial Virus by PCR: NEGATIVE
SARS Coronavirus 2 by RT PCR: NEGATIVE

## 2024-06-16 LAB — PROTIME-INR
INR: 1.3 — ABNORMAL HIGH (ref 0.8–1.2)
Prothrombin Time: 17.3 s — ABNORMAL HIGH (ref 11.4–15.2)

## 2024-06-16 LAB — HEPATITIS B SURFACE ANTIGEN: Hepatitis B Surface Ag: NONREACTIVE

## 2024-06-16 LAB — PRO BRAIN NATRIURETIC PEPTIDE: Pro Brain Natriuretic Peptide: >35000 pg/mL — ABNORMAL HIGH

## 2024-06-16 LAB — HEPATITIS B SURFACE ANTIBODY, QUANTITATIVE: Hep B S AB Quant (Post): 48.7 m[IU]/mL

## 2024-06-16 MED ORDER — CALCIUM GLUCONATE-NACL 2-0.675 GM/100ML-% IV SOLN
INTRAVENOUS | Status: AC
  Filled 2024-06-16: qty 100

## 2024-06-16 MED ORDER — METOPROLOL TARTRATE 5 MG/5ML IV SOLN
INTRAVENOUS | Status: AC
  Filled 2024-06-16: qty 5

## 2024-06-16 MED ORDER — CEFTRIAXONE SODIUM 1 G IJ SOLR
1.0000 g | Freq: Once | INTRAMUSCULAR | Status: AC
  Administered 2024-06-16: 1 g via INTRAMUSCULAR
  Filled 2024-06-16: qty 10

## 2024-06-16 MED ORDER — METOPROLOL TARTRATE 5 MG/5ML IV SOLN
5.0000 mg | Freq: Once | INTRAVENOUS | Status: AC
  Administered 2024-06-16: 5 mg via INTRAVENOUS

## 2024-06-16 MED ORDER — CALCIUM GLUCONATE-NACL 1-0.675 GM/50ML-% IV SOLN
1.0000 g | Freq: Once | INTRAVENOUS | Status: AC
  Administered 2024-06-16: 1000 mg via INTRAVENOUS
  Filled 2024-06-16: qty 50

## 2024-06-16 MED ORDER — FAMOTIDINE IN NACL 20-0.9 MG/50ML-% IV SOLN: 20.0000 mg | Freq: Once | INTRAVENOUS | Status: DC

## 2024-06-16 MED ORDER — MORPHINE SULFATE (PF) 2 MG/ML IV SOLN
2.0000 mg | Freq: Once | INTRAVENOUS | Status: AC
  Administered 2024-06-16: 2 mg via INTRAMUSCULAR
  Filled 2024-06-16: qty 1

## 2024-06-16 MED ORDER — IPRATROPIUM-ALBUTEROL 0.5-2.5 (3) MG/3ML IN SOLN
3.0000 mL | Freq: Once | RESPIRATORY_TRACT | Status: AC
  Administered 2024-06-16: 3 mL via RESPIRATORY_TRACT
  Filled 2024-06-16: qty 3

## 2024-06-16 MED ORDER — ONDANSETRON 4 MG PO TBDP
4.0000 mg | ORAL_TABLET | Freq: Once | ORAL | Status: AC
  Administered 2024-06-16: 4 mg via ORAL
  Filled 2024-06-16: qty 1

## 2024-06-16 MED ORDER — HEPARIN SODIUM (PORCINE) 1000 UNIT/ML IJ SOLN
INTRAMUSCULAR | Status: AC
  Filled 2024-06-16: qty 2

## 2024-06-16 NOTE — ED Provider Notes (Signed)
 " [facility] Provider Note  CSN: [id number] Arrival date & time: 06/16/24 1620  Chief Complaint(s) Shortness of Breath  HPI Monique Henderson is a 74 y.o. female with past medical history as below, significant for CVA, A-fib on Coumadin , aortic dissection, CHF, hypertension, ESRD on HD TTS who presents to the ED with complaint of dyspnea  Patient was seen here yesterday, she missed her dialysis on Thursday because she is having trouble breathing, abdominal pain nausea vomiting.  She had +trop +bnp, stable CTAP, her symptoms resolved and she requested dc.  Patient returns today because her symptoms have worsened.  She has not received dialysis since Tuesday.  She is having ongoing difficulty breathing, blood pressure is high.  Having some nausea and vomiting.  Subjective fevers and chills.  Body aches.  Of note she was admitted twice last month with similar symptoms.  Past Medical History Past Medical History:  Diagnosis Date   Acute cardioembolic stroke (HCC) 02/26/2022   AF (paroxysmal atrial fibrillation) (HCC) 05/29/2019   on Coumadin    Aortic atherosclerosis 07/05/2019   Aortic dissection (HCC) 04/04/2019   s/p repair   Bone spur 2008   Right calcaneal foot spur   Breast cancer (HCC) 2004   Ductal carcinoma in situ of the left breast; S/P left partial mastectomy 02/26/2003; S/P re-excision of cranial and lateral margins11/18/2004.radiation   Carotid artery disease    Cerebral thrombosis with cerebral infarction 05/22/2019   Chronic HFrEF (heart failure with reduced ejection fraction) (HCC)    Chronic low back pain 06/22/2016   Chronic obstructive lung disease (HCC) 01/16/2017   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 Deward Null; S/P re-excision of superior and inferior margins 10/27/2012.    Dissection of aorta (HCC) 04/03/2019   ESRD on hemodialysis (HCC) 05/29/2019   Essential  hypertension 09/16/2006   GERD 09/16/2006   Hepatitis C    treated and RNA confirmed not detectable 01/2017   Insomnia 03/14/2015   Malnutrition of moderate degree 05/19/2019   Non compliance with medical treatment 12/04/2017   Normocytic anemia    With thrombocytosis   Osteoarthritis    Right ureteral stone 2002   S/P lumbar spinal fusion 01/18/2014   S/P lumbar decompressive laminectomy, fusion, and plating for lumbar spinal stenosis on 05/27/2009 by Dr. Alm CANDIE Molt.  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 Alm CANDIE Molt.     Stroke (cerebrum) (HCC)    Stroke (HCC) 02/23/2022   SVT (supraventricular tachycardia) 08/28/2019   Tobacco use disorder 04/19/2009   Uterine fibroid    Wears dentures    top   Patient Active Problem List   Diagnosis Date Noted   ESRD (end stage renal disease) on dialysis (HCC) 02/14/2024   Acute exacerbation of chronic obstructive pulmonary disease (COPD) (HCC) 08/22/2023   SOB (shortness of breath) 08/21/2023   History of COPD 07/22/2023   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   History of aortic dissection 06/05/2023   History of cocaine  abuse (HCC) 06/05/2023   Chronic heart failure with mildly reduced ejection fraction (HFmrEF, 41-49%) (HCC) 06/05/2023   Long term (current) use of anticoagulants 06/05/2023   Acute hypoxic respiratory failure (HCC) 06/02/2023   Acute hypoxemic respiratory failure (HCC) 05/06/2023   Advance care planning 01/15/2023  Right shoulder pain 06/17/2022   Edema of right upper arm 06/17/2022   Hemiparesis affecting right side as late effect of cerebrovascular accident (CVA) (HCC) 04/06/2022   Hypervolemia associated with renal insufficiency 01/29/2022   HLD (hyperlipidemia) 01/29/2022   Polysubstance abuse (HCC) 01/06/2022    Acute on chronic combined systolic and diastolic CHF (congestive heart failure) (HCC) 01/06/2022   History of hepatitis C 12/08/2021   Hypertensive urgency 10/29/2021   Fluid overload 10/29/2021   Prolonged QT interval 10/29/2021   Hypertensive emergency 08/23/2021   Pure hypercholesterolemia 02/14/2021   Metabolic acidosis with increased anion gap and accumulation of organic acids 09/21/2020   Hypertension 09/21/2020   Hyperkalemia 08/28/2019   Aortic atherosclerosis 07/05/2019   History of CVA (cerebrovascular accident) 05/29/2019   Paroxysmal atrial fibrillation (HCC) 05/29/2019   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 04/07/2019   Non compliance with medical treatment 12/04/2017   Chronic low back pain 06/22/2016   Insomnia 03/14/2015   S/P lumbar spinal fusion 01/18/2014   Tobacco use disorder 04/19/2009   GERD 09/16/2006   Home Medication(s) Prior to Admission medications  Medication Sig Start Date End Date Taking? Authorizing Provider  acetaminophen  (TYLENOL ) 325 MG tablet Take 650 mg by mouth every 6 (six) hours as needed for mild pain (pain score 1-3) or moderate pain (pain score 4-6).    [provider]  albuterol  (PROVENTIL ) (2.5 MG/3ML) 0.083% nebulizer solution Take 3 mLs (2.5 mg total) by nebulization every 6 (six) hours as needed for wheezing or shortness of breath. 08/23/23   Pokhrel, Vernal, MD  albuterol  (VENTOLIN  HFA) 108 (90 Base) MCG/ACT inhaler Inhale 2 puffs into the lungs every 6 (six) hours as needed for shortness of breath. 10/02/23   [provider]  amLODipine  (NORVASC ) 10 MG tablet Take 1 tablet (10 mg total) by mouth daily. 03/06/22   Setzer, Sandra J, PA-C  apixaban  (ELIQUIS ) 2.5 MG TABS tablet Take 1 tablet (2.5 mg total) by mouth 2 (two) times daily. 03/06/22   Setzer, Sandra J, PA-C  Blood Pressure Monitoring (OMRON 3 SERIES BP MONITOR) DEVI Use as directed. 03/29/24   Tolia, Sunit, DO  carvedilol  (COREG ) 25 MG tablet Take 1 tablet (25 mg  total) by mouth 2 (two) times daily with a meal. 05/01/22 06/16/24  Ghimire, Donalda HERO, MD  Cholecalciferol  (VITAMIN D3) 50 MCG (2000 UT) TABS Take 1 tablet by mouth daily. 04/21/24   [provider]  cinacalcet  (SENSIPAR ) 60 MG tablet Take 60 mg by mouth every dialysis (Tuesday, Thursday, and Saturday).    [provider]  cloNIDine  (CATAPRES  - DOSED IN MG/24 HR) 0.2 mg/24hr patch Place 0.2 mg onto the skin once a week. 02/25/24   [provider]  hydrALAZINE  (APRESOLINE ) 100 MG tablet Take 1 tablet (100 mg total) by mouth every 8 (eight) hours. 12/18/22   Dann Candyce RAMAN, MD  isosorbide  mononitrate (IMDUR ) 30 MG 24 hr tablet TAKE 1 TABLET (30MG ) ON DIALYSIS DAYS AND 2 TABLETS (60 MG) ON NON DIALYSIS DAYS. 12/22/23   Tolia, Sunit, DO  LOKELMA  10 g PACK packet Take 1 packet (10 g) by mouth every Monday, Wednesday, and Friday. 05/31/24   Singh, Prashant K, MD  naloxone  (NARCAN ) nasal spray 4 mg/0.1 mL Place 1 spray into the nose as needed (opioid overdose). 02/19/22   [provider]  oxyCODONE -acetaminophen  (PERCOCET) 10-325 MG tablet Take 1 tablet by mouth every 8 (eight) hours as needed for pain.    [provider]  pantoprazole  (PROTONIX ) 40  MG tablet TAKE 1 TABLET (40 MG TOTAL) BY MOUTH 2 (TWO) TIMES DAILY. 05/13/23   Tanda Bleacher, MD  rosuvastatin  (CRESTOR ) 10 MG tablet TAKE 1 TABLET (10 MG TOTAL) BY MOUTH DAILY FOR CHOLESTEROL 08/07/22   Tanda Bleacher, MD  sevelamer  carbonate (RENVELA ) 2.4 g PACK Take 2.4 g by mouth daily.    [provider]  traZODone  (DESYREL ) 50 MG tablet Take 25-50 mg by mouth at bedtime as needed for sleep. 03/19/23   [provider]                                                                                                                                    Past Surgical History Past Surgical History:  Procedure Laterality Date   ANTERIOR LAT LUMBAR FUSION N/A 01/18/2014   Procedure: ANTERIOR LATERAL  LUMBAR FUSION LUMBAR TWO-THREE;  Surgeon: Alm GORMAN Molt, MD;  Location: [facility];  Service: Neurosurgery;  Laterality: N/A;   ANTERIOR LUMBAR FUSION  01/18/2014   AV FISTULA PLACEMENT Left 04/20/2019   Procedure: ARTERIOVENOUS (AV) FISTULA CREATION;  Surgeon: Sheree Penne Bruckner, MD;  Location: [facility] OR;  Service: Vascular;  Laterality: Left;   BACK SURGERY     BREAST LUMPECTOMY Left 01/2003   BREAST LUMPECTOMY Right 2014   BREAST LUMPECTOMY WITH NEEDLE LOCALIZATION AND AXILLARY SENTINEL LYMPH NODE BX Right 10/13/2012   Procedure: BREAST LUMPECTOMY WITH NEEDLE LOCALIZATION;  Surgeon: Deward GORMAN Curvin DOUGLAS, MD;  Location: [facility];  Service: General;  Laterality: Right;  Right breast wire localized lumpectomy   DIALYSIS/PERMA CATHETER INSERTION N/A 05/28/2023   Procedure: DIALYSIS/PERMA CATHETER INSERTION;  Surgeon: Melia Lynwood ORN, MD;  Location: [facility];  Service: Cardiovascular;  Laterality: N/A;   INSERTION OF DIALYSIS CATHETER Right 04/20/2019   Procedure: INSERTION OF DIALYSIS CATHETER, right internal jugular;  Surgeon: Sheree Penne Bruckner, MD;  Location: [facility] OR;  Service: Vascular;  Laterality: Right;   INSERTION OF DIALYSIS CATHETER Right 09/24/2020   Procedure: INSERTION OF TUNNELED DIALYSIS CATHETER;  Surgeon: Oris Krystal FALCON, MD;  Location: [facility] OR;  Service: Vascular;  Laterality: Right;   IR FLUORO GUIDE CV LINE RIGHT  09/22/2020   IR THORACENTESIS RIGHT ASP PLEURAL SPACE W/IMG GUIDE  05/19/2019   IR US  GUIDE VASC ACCESS LEFT  09/22/2020   IR US  GUIDE VASC ACCESS RIGHT  09/22/2020   IR VENOCAVAGRAM SVC  09/22/2020   LAMINECTOMY  05/27/2009   Lumbar decompressive laminectomy, fusion and plating for lumbar spinal stensosis   LIGATION OF ARTERIOVENOUS  FISTULA Left 09/24/2020   Procedure: LIGATION OF LEFT ARM ARTERIOVENOUS  FISTULA;  Surgeon: Oris Krystal FALCON, MD;  Location: [facility] OR;  Service: Vascular;  Laterality: Left;   LUMBAR LAMINECTOMY/DECOMPRESSION  MICRODISCECTOMY Left 03/23/2013   Procedure: LUMBAR LAMINECTOMY/DECOMPRESSION MICRODISCECTOMY 1 LEVEL;  Surgeon: Alm GORMAN Molt, MD;  Location: [facility];  Service: Neurosurgery;  Laterality: Left;  LUMBAR LAMINECTOMY/DECOMPRESSION MICRODISCECTOMY 1  LEVEL   MASTECTOMY, PARTIAL Left 02/26/2003   ; S/P re-excision of cranial and lateral margins 04/19/2003.    RE-EXCISION OF BREAST CANCER,SUPERIOR MARGINS Right 10/27/2012   Procedure: RE-EXCISION OF BREAST CANCER,SUPERIOR and inferior MARGINS;  Surgeon: Deward GORMAN Curvin DOUGLAS, MD;  Location: [facility] OR;  Service: General;  Laterality: Right;   RE-EXCISION OF BREAST LUMPECTOMY Left 04/2003   TEE WITHOUT CARDIOVERSION N/A 04/04/2019   Procedure: Transesophageal Echocardiogram (Tee);  Surgeon: German Bartlett PEDLAR, MD;  Location: [facility] OR;  Service: Open Heart Surgery;  Laterality: N/A;   THORACIC AORTIC ANEURYSM REPAIR N/A 04/04/2019   Procedure: THORACIC ASCENDING ANEURYSM REPAIR (AAA)  USING 28 MM X 30 CM HEMASHIELD PLATINUM VASCULAR GRAFT;  Surgeon: German Bartlett PEDLAR, MD;  Location: [facility] OR;  Service: Open Heart Surgery;  Laterality: N/A;   Family History Family History  Problem Relation Age of Onset   Colon cancer Mother 67   Hypertension Mother    Diabetes Sister 53   Hypertension Sister    Diabetes Brother    Hypertension Brother    Diabetes Brother    Hypertension Brother    Kidney disease Son        s/p renal transplant   Hypertension Son    Diabetes Son    Multiple sclerosis Son    Bone cancer Sister 61   Breast cancer Neg Hx    Cervical cancer Neg Hx     Social History Social History[1] Allergies Shrimp [shellfish allergy], Bactroban  [mupirocin ], Hydrocodone , Chlorhexidine  gluconate, Latex, Tylenol  [acetaminophen ], and Zestril  [lisinopril ]  Review of Systems A thorough review of systems was obtained and all systems are negative except as noted in the HPI and PMH.   Physical Exam Vital Signs  I have reviewed the triage vital signs BP (!)  178/108   Pulse 98   Resp (!) 36   Ht 5' 3 (1.6 m)   Wt 49 kg   SpO2 100%   BMI 19.13 kg/m  Physical Exam Vitals and nursing note reviewed.  Constitutional:      General: She is not in acute distress.    Appearance: Normal appearance. She is well-developed. She is not ill-appearing.  HENT:     Head: Normocephalic and atraumatic.     Right Ear: External ear normal.     Left Ear: External ear normal.     Nose: Nose normal.     Mouth/Throat:     Mouth: Mucous membranes are moist.  Eyes:     General: No scleral icterus.       Right eye: No discharge.        Left eye: No discharge.  Cardiovascular:     Rate and Rhythm: Normal rate.  Pulmonary:     Effort: Pulmonary effort is normal. Tachypnea present. No respiratory distress.     Breath sounds: Decreased air movement present. No stridor.     Comments: Breath sounds coarse bilateral Chest:    Abdominal:     General: Abdomen is flat. There is no distension.     Palpations: Abdomen is soft.     Tenderness: There is no abdominal tenderness. There is no guarding.  Musculoskeletal:        General: No deformity.     Cervical back: No rigidity.  Skin:    General: Skin is warm and dry.     Coloration: Skin is not cyanotic, jaundiced or pale.  Neurological:     Mental Status: She is alert.  Psychiatric:  Speech: Speech normal.        Behavior: Behavior normal. Behavior is cooperative.     ED Results and Treatments Labs (all labs ordered are listed, but only abnormal results are displayed) Labs Reviewed  CBC WITH DIFFERENTIAL/PLATELET - Abnormal; Notable for the following components:      Result Value   RBC 3.14 (*)    Hemoglobin 9.2 (*)    HCT 30.4 (*)    RDW 18.7 (*)    All other components within normal limits  POCT I-STAT, CHEM 8 - Abnormal; Notable for the following components:   Potassium 6.9 (*)    BUN 52 (*)    Creatinine, Ser 12.80 (*)    Glucose, Bld 131 (*)    Calcium , Ion 0.96 (*)    Hemoglobin  10.5 (*)    HCT 31.0 (*)    All other components within normal limits  RESP PANEL BY RT-PCR (RSV, FLU A&B, COVID)  RVPGX2  BASIC METABOLIC PANEL WITH GFR  PRO BRAIN NATRIURETIC PEPTIDE  PROTIME-INR  HEPATITIS B SURFACE ANTIGEN  HEPATITIS B SURFACE ANTIBODY, QUANTITATIVE  I-STAT CHEM 8, ED  I-STAT CG4 LACTIC ACID, ED  I-STAT CG4 LACTIC ACID, ED  CG4 I-STAT (LACTIC ACID)                                                                                                                          Radiology DG Chest Port 1 View Result Date: 06/16/2024 EXAM: 1 VIEW(S) XRAY OF THE CHEST 06/16/2024 05:25:00 PM COMPARISON: 06/15/2024 CLINICAL HISTORY: dib FINDINGS: LINES, TUBES AND DEVICES: Right chest dialysis catheter stable in position. LUNGS AND PLEURA: New right basilar airspace opacity. Small right pleural effusion. Mild diffuse interstitial prominence consistent with mild pulmonary edema. No pneumothorax. HEART AND MEDIASTINUM: Cardiomegaly, unchanged. Atherosclerotic calcifications. BONES AND SOFT TISSUES: Post median sternotomy noted. Multilevel degenerative changes of thoracic spine. IMPRESSION: 1. New right basilar airspace opacity. 2. Increasing diffuse interstitial prominence consistent with mild pulmonary edema. 3. New small right pleural effusion. 4. Cardiomegaly Electronically signed by: Greig Pique MD 06/16/2024 05:49 PM EST RP Workstation: [id number]    Pertinent labs & imaging results that were available during my care of the patient were reviewed by me and considered in my medical decision making (see MDM for details).  Medications Ordered in ED Medications  famotidine  (PEPCID ) IVPB 20 mg premix (has no administration in time range)  morphine  (PF) 2 MG/ML injection 2 mg (2 mg Intramuscular Given 06/16/24 1822)  ondansetron  (ZOFRAN -ODT) disintegrating tablet 4 mg (4 mg Oral Given 06/16/24 1821)  cefTRIAXone  (ROCEPHIN ) injection 1 g (1 g Intramuscular Given 06/16/24 1828)   ipratropium-albuterol  (DUONEB) 0.5-2.5 (3) MG/3ML nebulizer solution 3 mL (3 mLs Nebulization Given 06/16/24 1828)  Procedures Procedures  (including critical care time)  Medical Decision Making / ED Course    Medical Decision Making:    JASSICA ZAZUETA is a 74 y.o. female with past medical history as below, significant for CVA, A-fib on Coumadin , aortic dissection, CHF, hypertension, ESRD on HD TTS who presents to the ED with complaint of dyspnea. The complaint involves an extensive differential diagnosis and also carries with it a high risk of complications and morbidity.  Serious etiology was considered. Ddx includes but is not limited to: In my evaluation of this patient's dyspnea my DDx includes, but is not limited to, pneumonia, pulmonary embolism, pneumothorax, pulmonary edema, metabolic acidosis, asthma, COPD, cardiac cause, anemia, anxiety, etc.    Complete initial physical exam performed, notably the patient was in no acute distress, she is tachypneic.    Reviewed and confirmed nursing documentation for past medical history, family history, social history.  Vital signs reviewed.    Progressive dyspnea in setting of ESRD on HD and recently missed dialysis session> - She is hypertensive, tachypneic.  Reduced air movement.  Concern for possible fluid overload pulmonary edema from missed dialysis. - X-ray with pleural effusion, pulm edema - She is tachypneic, hypoxic. - Possible concurrent pneumonia, given dose of Rocephin  while in the ER - Labs are pending - Spoke with nephro who will take patient to HD this evening urgently - Spoke with Dr. Tobie with [facility] - Plan to reassess patient once she returns from dialysis.  May be able to discharge if feeling better.  If still unstable we will plan admission.    Clinical Course as of 06/16/24 2006   Fri Jun 16, 2024  1742 Spoke w/ Dr Dennise (renal) plan HD this pm [SG]  1808 CXR concerning for new infiltrate from yestd, she is having increased dib and cough. Will cover w/ abx  CXR also w/ pulm edema w/ pleural eff; concern fluid overload in setting missed HD.  [SG]    Clinical Course User Index [SG] Elnor Jayson LABOR, DO     Dispo pending return from hd               Additional history obtained: -Additional history obtained from family -External records from outside source obtained and reviewed including: Chart review including previous notes, labs, imaging, consultation notes including  Recent admission, recent ER evaluation   Lab Tests: -I ordered, reviewed, and interpreted labs.   The pertinent results include:   Labs Reviewed  CBC WITH DIFFERENTIAL/PLATELET - Abnormal; Notable for the following components:      Result Value   RBC 3.14 (*)    Hemoglobin 9.2 (*)    HCT 30.4 (*)    RDW 18.7 (*)    All other components within normal limits  POCT I-STAT, CHEM 8 - Abnormal; Notable for the following components:   Potassium 6.9 (*)    BUN 52 (*)    Creatinine, Ser 12.80 (*)    Glucose, Bld 131 (*)    Calcium , Ion 0.96 (*)    Hemoglobin 10.5 (*)    HCT 31.0 (*)    All other components within normal limits  RESP PANEL BY RT-PCR (RSV, FLU A&B, COVID)  RVPGX2  BASIC METABOLIC PANEL WITH GFR  PRO BRAIN NATRIURETIC PEPTIDE  PROTIME-INR  HEPATITIS B SURFACE ANTIGEN  HEPATITIS B SURFACE ANTIBODY, QUANTITATIVE  I-STAT CHEM 8, ED  I-STAT CG4 LACTIC ACID, ED  I-STAT CG4 LACTIC ACID, ED  CG4 I-STAT (LACTIC ACID)    Notable  for labs are pending  EKG   EKG Interpretation Date/Time:  Friday June 16 2024 17:04:02 EST Ventricular Rate:  95 PR Interval:  169 QRS Duration:  149 QT Interval:  414 QTC Calculation: 521 R Axis:   62  Text Interpretation: Sinus rhythm Probable left atrial enlargement Right bundle branch block similar to prior Confirmed by Elnor Savant ([id number]) on 06/16/2024 5:18:01 PM         Imaging Studies ordered: I ordered imaging studies including chest x-ray I independently visualized the following imaging with scope of interpretation limited to determining acute life threatening conditions related to emergency care; findings noted above I agree with the radiologist interpretation If any imaging was obtained with contrast I closely monitored patient for any possible adverse reaction a/w contrast administration in the emergency department   Medicines ordered and prescription drug management: Meds ordered this encounter  Medications   morphine  (PF) 2 MG/ML injection 2 mg   ondansetron  (ZOFRAN -ODT) disintegrating tablet 4 mg   cefTRIAXone  (ROCEPHIN ) injection 1 g    Antibiotic Indication::   CAP   ipratropium-albuterol  (DUONEB) 0.5-2.5 (3) MG/3ML nebulizer solution 3 mL   famotidine  (PEPCID ) IVPB 20 mg premix    -I have reviewed the patients home medicines and have made adjustments as needed   Consultations Obtained: I requested consultation with the nephrology, [facility],  and discussed lab and imaging findings as well as pertinent plan    Cardiac Monitoring: The patient was maintained on a cardiac monitor.  I personally viewed and interpreted the cardiac monitored which showed an underlying rhythm of: Sinus tach > NSR Continuous pulse oximetry interpreted by myself, 97% on 4LNC.    Social Determinants of Health:  Diagnosis or treatment significantly limited by social determinants of health: former smoker and polysubstance abuse   Reevaluation: After the interventions noted above, I reevaluated the patient and found that they have stayed the same  Co morbidities that complicate the patient evaluation  Past Medical History:  Diagnosis Date   Acute cardioembolic stroke (HCC) 02/26/2022   AF (paroxysmal atrial fibrillation) (HCC) 05/29/2019   on Coumadin    Aortic atherosclerosis 07/05/2019   Aortic dissection (HCC)  04/04/2019   s/p repair   Bone spur 2008   Right calcaneal foot spur   Breast cancer (HCC) 2004   Ductal carcinoma in situ of the left breast; S/P left partial mastectomy 02/26/2003; S/P re-excision of cranial and lateral margins11/18/2004.radiation   Carotid artery disease    Cerebral thrombosis with cerebral infarction 05/22/2019   Chronic HFrEF (heart failure with reduced ejection fraction) (HCC)    Chronic low back pain 06/22/2016   Chronic obstructive lung disease (HCC) 01/16/2017   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 Deward Null; S/P re-excision of superior and inferior margins 10/27/2012.    Dissection of aorta (HCC) 04/03/2019   ESRD on hemodialysis (HCC) 05/29/2019   Essential hypertension 09/16/2006   GERD 09/16/2006   Hepatitis C    treated and RNA confirmed not detectable 01/2017   Insomnia 03/14/2015   Malnutrition of moderate degree 05/19/2019   Non compliance with medical treatment 12/04/2017   Normocytic anemia    With thrombocytosis   Osteoarthritis    Right ureteral stone 2002   S/P lumbar spinal fusion 01/18/2014   S/P lumbar decompressive laminectomy, fusion, and plating for lumbar spinal stenosis on 05/27/2009 by Dr. Alm CANDIE Molt.  S/P anterolateral retroperitoneal interbody fusion L2-3 utilizing a 8 mm  peek interbody cage packed with morcellized allograft, and anterior lumbar plating L2-3 for recurrent disc herniation L2-3 with spinal stenosis on 01/18/2014 by Dr. Alm CANDIE Molt.     Stroke (cerebrum) (HCC)    Stroke (HCC) 02/23/2022   SVT (supraventricular tachycardia) 08/28/2019   Tobacco use disorder 04/19/2009   Uterine fibroid    Wears dentures    top      Dispostion: Disposition decision including need for hospitalization was considered, and patient disposition pending at time of sign out.    Final Clinical Impression(s) / ED Diagnoses Final diagnoses:  ESRD on hemodialysis (HCC)   Community acquired pneumonia, unspecified laterality  Nausea and vomiting, unspecified vomiting type  Acute pulmonary edema (HCC)         [1]  Social History Tobacco Use   Smoking status: Former    Current packs/day: 0.00    Average packs/day: 0.3 packs/day for 44.0 years (11.0 ttl pk-yrs)    Types: Cigarettes    Start date: 45    Quit date: 2022    Years since quitting: 4.0   Smokeless tobacco: Never   Tobacco comments:    Former smoker 04/21/23  Vaping Use   Vaping status: Never Used  Substance Use Topics   Alcohol  use: Not Currently    Alcohol /week: 2.0 standard drinks of alcohol     Types: 2 Cans of beer per week    Comment: couple beers a weekend, not currently   Drug use: Not Currently    Types: Cocaine     Comment: last in 2005     Elnor Savant A, DO 06/16/24 2007  "

## 2024-06-16 NOTE — ED Notes (Signed)
 Report given to dialysis RN. Transport here to take pt to dialysis

## 2024-06-16 NOTE — ED Triage Notes (Signed)
 Pt BIBA from home for SOB, pt wears 4 L [place] at baseline but per EMS pt was not on O2 on their arrival- was 86% on RA. Pt was here yesterday for abdominal pain, discharged. Pt receives dialysis, last session was Tuesday.

## 2024-06-16 NOTE — ED Notes (Signed)
 RN unable to attempt IV or labs at this time, second RN to attempt with US .

## 2024-06-16 NOTE — Progress Notes (Signed)
 Rapid response called pt on A-Fib/ SVT HR 150's

## 2024-06-16 NOTE — Significant Event (Addendum)
 Rapid Response Event Note   Reason for Call :  Tachycardia-150s  Pt came to ED today for SOB after missing HD tx yesterday. Pt was transported to HD unit for HD with plans for ED MD to reassess for admission vs discharge after HD tx.   After approx 1 hour of HD tx, pt became tachycardic 130-150s. RRT was called.  Initial Focused Assessment:  Pt lying in bed with eyes closed, in no visible distress. She is easily arousable. She denies CP/SOB/dizziness. Lung sound diminished t/o. Skin warm/dry.   T-99, HR-130-150s, BP-196/115, RR-28, SpO2-100% on 4L [place]  Interventions:  EKG-AF RVR with premature ventricular or aberrantly conducted complexes, nonspecific intraventricular block, T wave abn, consider ANT ischemia 5mg  metoprolol  IV 1g Ca gluc IV Plan of Care:  HR down to 110s after interventions. Pt remains asymptomatic. Finish HD tx. ED to make decision regarding admission post HD. Please call RRT if further assistance needed.    Event Summary:   MD Notified: Dr. Osa MD) Call [phone] Arrival [phone] End Time:2230  Tish Graeme Piety, RN

## 2024-06-16 NOTE — Progress Notes (Signed)
 On call MD informed of Critical lab result of potasium of 7.2. bath had been change to 1K for 1 hour.

## 2024-06-16 NOTE — Progress Notes (Signed)
 Noticed that patient is in the ER. Presents with SOB. Reached out to Dr. Elnor, patient will need HD. Last OP HD was 1/13, missed yesterday. Came to the ER with SOB yesterday, was getting set up with HD per our NP however patient left. Presents again with same issue. Markedly hypertensive. I personally reviewed her CXR (official read pending) which seems to show increased vascular congestion especially as compared to her CXR yesterday. OP HD orders: [facility], TTS. 3.5 hrs. TDC flow rates: 400/autoflow 1.5. edw 43.5kg. 2k/2.0 cal. Heparin : none. Meds: Mircera 50mcg every 2 weeks (last dose 1/6), venofer  50mg  weekly, hectorol  7mcg every treatment, sensipar  60mg  every treatment.  Plan: -will arrange for HD tonight. Discussed with [facility] staff -labs pending, please follow -dispo per ER MD--if d/c post HD, please encourage patient to go to HD tomorrow as per her schedule. If to be admitted, then will be seen in AM -please call with any questions/concerns in the interim  Ephriam Stank, MD [facility]

## 2024-06-17 DIAGNOSIS — J810 Acute pulmonary edema: Secondary | ICD-10-CM | POA: Diagnosis not present

## 2024-06-17 DIAGNOSIS — I160 Hypertensive urgency: Secondary | ICD-10-CM

## 2024-06-17 DIAGNOSIS — I4719 Other supraventricular tachycardia: Secondary | ICD-10-CM

## 2024-06-17 DIAGNOSIS — E8770 Fluid overload, unspecified: Secondary | ICD-10-CM | POA: Diagnosis not present

## 2024-06-17 DIAGNOSIS — Z992 Dependence on renal dialysis: Secondary | ICD-10-CM | POA: Diagnosis not present

## 2024-06-17 DIAGNOSIS — N186 End stage renal disease: Secondary | ICD-10-CM

## 2024-06-17 LAB — BASIC METABOLIC PANEL WITH GFR
Anion gap: 18 — ABNORMAL HIGH (ref 5–15)
BUN: 19 mg/dL (ref 8–23)
CO2: 21 mmol/L — ABNORMAL LOW (ref 22–32)
Calcium: 9.1 mg/dL (ref 8.9–10.3)
Chloride: 93 mmol/L — ABNORMAL LOW (ref 98–111)
Creatinine, Ser: 5.08 mg/dL — ABNORMAL HIGH (ref 0.44–1.00)
GFR, Estimated: 9 mL/min — ABNORMAL LOW
Glucose, Bld: 93 mg/dL (ref 70–99)
Potassium: 5.2 mmol/L — ABNORMAL HIGH (ref 3.5–5.1)
Sodium: 131 mmol/L — ABNORMAL LOW (ref 135–145)

## 2024-06-17 LAB — MAGNESIUM: Magnesium: 2.1 mg/dL (ref 1.7–2.4)

## 2024-06-17 LAB — PROCALCITONIN: Procalcitonin: 3.45 ng/mL

## 2024-06-17 MED ORDER — METOPROLOL TARTRATE 5 MG/5ML IV SOLN
5.0000 mg | Freq: Once | INTRAVENOUS | Status: AC
  Administered 2024-06-17: 5 mg via INTRAVENOUS
  Filled 2024-06-17: qty 5

## 2024-06-17 MED ORDER — ISOSORBIDE MONONITRATE ER 30 MG PO TB24
30.0000 mg | ORAL_TABLET | ORAL | Status: DC
  Administered 2024-06-17: 30 mg via ORAL
  Filled 2024-06-17 (×2): qty 1

## 2024-06-17 MED ORDER — PANTOPRAZOLE SODIUM 40 MG PO TBEC
40.0000 mg | DELAYED_RELEASE_TABLET | Freq: Two times a day (BID) | ORAL | Status: DC
  Administered 2024-06-17 – 2024-06-18 (×3): 40 mg via ORAL
  Filled 2024-06-17 (×3): qty 1

## 2024-06-17 MED ORDER — SEVELAMER CARBONATE 2.4 G PO PACK
2.4000 g | PACK | Freq: Every day | ORAL | Status: DC
  Administered 2024-06-18: 2.4 g via ORAL
  Filled 2024-06-17: qty 1

## 2024-06-17 MED ORDER — HYDRALAZINE HCL 25 MG PO TABS
100.0000 mg | ORAL_TABLET | Freq: Three times a day (TID) | ORAL | Status: DC
  Administered 2024-06-17 – 2024-06-18 (×4): 100 mg via ORAL
  Filled 2024-06-17 (×4): qty 4

## 2024-06-17 MED ORDER — HEPARIN SODIUM (PORCINE) 1000 UNIT/ML IJ SOLN
INTRAMUSCULAR | Status: AC
  Filled 2024-06-17: qty 4

## 2024-06-17 MED ORDER — ISOSORBIDE MONONITRATE ER 30 MG PO TB24: 30.0000 mg | ORAL_TABLET | ORAL | Status: DC

## 2024-06-17 MED ORDER — CINACALCET HCL 30 MG PO TABS
60.0000 mg | ORAL_TABLET | ORAL | Status: DC
  Administered 2024-06-17: 60 mg via ORAL
  Filled 2024-06-17: qty 2

## 2024-06-17 MED ORDER — ISOSORBIDE MONONITRATE ER 60 MG PO TB24
60.0000 mg | ORAL_TABLET | ORAL | Status: DC
  Administered 2024-06-18: 60 mg via ORAL
  Filled 2024-06-17: qty 1

## 2024-06-17 MED ORDER — APIXABAN 2.5 MG PO TABS
2.5000 mg | ORAL_TABLET | Freq: Two times a day (BID) | ORAL | Status: DC
  Administered 2024-06-17 – 2024-06-18 (×3): 2.5 mg via ORAL
  Filled 2024-06-17 (×3): qty 1

## 2024-06-17 MED ORDER — AMIODARONE HCL 200 MG PO TABS
200.0000 mg | ORAL_TABLET | Freq: Two times a day (BID) | ORAL | Status: DC
  Administered 2024-06-17 – 2024-06-18 (×2): 200 mg via ORAL
  Filled 2024-06-17 (×2): qty 1

## 2024-06-17 MED ORDER — TRAZODONE HCL 50 MG PO TABS: 25.0000 mg | ORAL_TABLET | Freq: Every evening | ORAL | Status: DC | PRN

## 2024-06-17 MED ORDER — SODIUM ZIRCONIUM CYCLOSILICATE 10 G PO PACK: 10.0000 g | PACK | ORAL | Status: DC

## 2024-06-17 MED ORDER — AMLODIPINE BESYLATE 5 MG PO TABS: 10.0000 mg | ORAL_TABLET | Freq: Every day | ORAL | Status: DC

## 2024-06-17 MED ORDER — ROSUVASTATIN CALCIUM 5 MG PO TABS
10.0000 mg | ORAL_TABLET | Freq: Every day | ORAL | Status: DC
  Administered 2024-06-17 – 2024-06-18 (×2): 10 mg via ORAL
  Filled 2024-06-17 (×2): qty 2

## 2024-06-17 MED ORDER — DILTIAZEM HCL 30 MG PO TABS: 30.0000 mg | ORAL_TABLET | Freq: Three times a day (TID) | ORAL | Status: DC

## 2024-06-17 MED ORDER — CEFTRIAXONE SODIUM 1 G IJ SOLR
1.0000 g | INTRAMUSCULAR | Status: DC
  Administered 2024-06-17: 1 g via INTRAMUSCULAR
  Filled 2024-06-17 (×2): qty 10

## 2024-06-17 MED ORDER — SODIUM CHLORIDE 0.9 % IV SOLN
100.0000 mg | Freq: Two times a day (BID) | INTRAVENOUS | Status: DC
  Administered 2024-06-17 (×2): 100 mg via INTRAVENOUS
  Filled 2024-06-17 (×5): qty 100

## 2024-06-17 MED ORDER — CARVEDILOL 25 MG PO TABS
25.0000 mg | ORAL_TABLET | Freq: Two times a day (BID) | ORAL | Status: DC
  Administered 2024-06-17 – 2024-06-18 (×2): 25 mg via ORAL
  Filled 2024-06-17: qty 1
  Filled 2024-06-17: qty 2

## 2024-06-17 MED ORDER — HEPARIN SODIUM (PORCINE) 1000 UNIT/ML DIALYSIS
1000.0000 [IU] | INTRAMUSCULAR | Status: DC | PRN
  Administered 2024-06-17: 1000 [IU]

## 2024-06-17 NOTE — Progress Notes (Addendum)
 Received patient in bed to unit.  Asleep responds to voice. oriented.  Informed consent signed and in chart.   TX duration: 3:30   Patient had to be restarted on a different machine Totals are as charted in note  Patient in Afib-assessed by RR Transported back to the room ED Alert, without acute distress.  Hand-off given to patient's nurse. ED shift Nurse  Access used: Dialysis Catheter Access issues: None  Total UF removed: 3300  Medication(s) given: Metoprolol  5mg  /Calcium  Gluconate 1GM  Post HD VS: T97.7-HR113-RR24 b/p180/111 Post HD weight: 45.7KG  Neville Seip, RN [facility]   06/17/24 0040  Vitals  Temp 97.7 F (36.5 C)  Temp Source Axillary  BP (!) 180/111  MAP (mmHg) 129  BP Location Right Arm  BP Method Automatic  Patient Position (if appropriate) Lying  Pulse Rate (!) 113  Pulse Rate Source Monitor  ECG Heart Rate (!) 113  Resp (!) 24  Weight 45.7 kg  Type of Weight Post-Dialysis  Oxygen  Therapy  SpO2 100 %  O2 Device Nasal Cannula  O2 Flow Rate (L/min) 4 L/min  Patient Activity (if Appropriate) In bed  Pulse Oximetry Type Continuous  During Treatment Monitoring  Blood Flow Rate (mL/min) 0 mL/min  Arterial Pressure (mmHg) -36.96 mmHg  Venous Pressure (mmHg) 58.79 mmHg  TMP (mmHg) 6.87 mmHg  Ultrafiltration Rate (mL/min) 0 mL/min  Dialysate Flow Rate (mL/min) 300 ml/min  Dialysate Potassium Concentration 2  Dialysate Calcium  Concentration 2.5  Duration of HD Treatment -hour(s) 2 hour(s)  Cumulative Fluid Removed (mL) per Treatment  1408.88  HD Safety Checks Performed Yes  Intra-Hemodialysis Comments See progress note  Hemodialysis Catheter Right Subclavian Double lumen Permanent (Tunneled)  Placement Date/Time: 05/28/23 0942   Placed prior to admission: No  Serial / Lot #: [id number]  Expiration Date: 04/01/27  Time Out: Correct patient;Correct site;Correct procedure  Maximum sterile barrier precautions: Sterile gloves;Sterile  gown;Mask;C...  Site Condition No complications  Blue Lumen Status Flushed;Antimicrobial dead end cap;Heparin  locked  Red Lumen Status Flushed;Antimicrobial dead end cap;Heparin  locked  Purple Lumen Status N/A  Catheter fill solution Heparin  1000 units/ml  Catheter fill volume (Arterial) 1.9 cc  Catheter fill volume (Venous) 1.9  Dressing Type Transparent  Dressing Status Antimicrobial disc/dressing in place;Clean, Dry, Intact  Interventions New dressing  Drainage Description None  Dressing Change Due 06/23/24  Post treatment catheter status Capped and Clamped

## 2024-06-17 NOTE — Consult Note (Signed)
 ESRD Consult Note  Reason for consult: ESRD, provision of dialysis  Assessment/Recommendations:  ESRD  -outpatient HD orders: [facility], TTS. 3.5 hrs. TDC flow rates: 400/autoflow 1.5. edw 43.5kg. 2k/2.0 cal. Heparin : none. Meds: Mircera 50mcg every 2 weeks (last dose 1/6), venofer  50mg  weekly, hectorol  7mcg every treatment, sensipar  60mg  every treatment.  -s/p HD 1/16, HD again today--back on TTS schedule  Acute on chronic HFrEF -UF as tolerated  Afib with RVR -per primary service  Hyperkalemia -improved with HD, recommend renal diet.  HD today  Volume/ hypertensive urgency -UF as tolerated  Anemia of Chronic Kidney Disease -Hgb 9.2, not due for ESA yet -Transfuse PRN for Hgb <7  Secondary Hyperparathyroidism/Hyperphosphatemia - check phos   # Additional recommendations: - Dose all meds for creatinine clearance < 10 ml/min  - Unless absolutely necessary, no MRIs with gadolinium.  - Implement save arm precautions.  Prefer needle sticks in the dorsum of the hands or wrists.  No blood pressure measurements in arm. - If blood transfusion is requested during hemodialysis sessions, please alert us  prior to the session.  - If a hemodialysis catheter line culture is requested, please alert us  as only hemodialysis nurses are able to collect those specimens.   Recommendations were discussed with the primary team.  Ephriam Stank, MD [facility]  History of Present Illness: Monique Henderson is a/an 74 y.o. female with a past medical history of ESRD, CHF, AFIB, COPD, chronic hypoxemic respiratory failure on home oxygen , history of CVA, CAD, history of aortic dissection status post repair, history of breast cancer, GERD, hepatitis C history, SVT, history of tobacco and cocaine  abuse, history of noncompliance who presents with shortness of breath again.  Missed her dialysis on Thursday.  Chest x-ray showed new right basilar airspace opacity with increased mild pulmonary edema  and new small right pleural effusion.  She was found to be significantly hyperkalemic with a potassium of 7.2.  Underwent dialysis last night UF charted as 3.3 L.  Patient seen in room.  She reports feeling better.  She was also profoundly hypertensive which is also somewhat proved today.  She does endorse hunger.  Her rapid response was called during dialysis last night due to A-fib with RVR.  No other complaints.   Medications:  Current Facility-Administered Medications  Medication Dose Route Frequency Provider Last Rate Last Admin   amLODipine  (NORVASC ) tablet 10 mg  10 mg Oral Daily Rathore, Vasundhra, MD       apixaban  (ELIQUIS ) tablet 2.5 mg  2.5 mg Oral BID Rathore, Vasundhra, MD       carvedilol  (COREG ) tablet 25 mg  25 mg Oral BID WC Rathore, Vasundhra, MD       cefTRIAXone  (ROCEPHIN ) injection 1 g  1 g Intramuscular Q24H Rathore, Vasundhra, MD       cinacalcet  (SENSIPAR ) tablet 60 mg  60 mg Oral Q T,Th,Sa-HD Alfornia Madison, MD       doxycycline  (VIBRAMYCIN ) 100 mg in sodium chloride  0.9 % 250 mL IVPB  100 mg Intravenous Q12H Alfornia Madison, MD       hydrALAZINE  (APRESOLINE ) tablet 100 mg  100 mg Oral Q8H Rathore, Vasundhra, MD   100 mg at 06/17/24 9352   isosorbide  mononitrate (IMDUR ) 24 hr tablet 30 mg  30 mg Oral Q T,Th,Sa-HD Laron Agent, [facility]       And   [START ON 06/18/2024] isosorbide  mononitrate (IMDUR ) 24 hr tablet 60 mg  60 mg Oral Once per day on Sunday Monday Wednesday Friday  Laron Agent, RPH       pantoprazole  (PROTONIX ) EC tablet 40 mg  40 mg Oral BID Rathore, Vasundhra, MD       rosuvastatin  (CRESTOR ) tablet 10 mg  10 mg Oral Daily Rathore, Vasundhra, MD       sevelamer  carbonate (RENVELA ) powder PACK 2.4 g  2.4 g Oral Daily Rathore, Vasundhra, MD       [START ON 06/19/2024] sodium zirconium cyclosilicate  (LOKELMA ) packet 10 g  10 g Oral Q M,W,F Rathore, Vasundhra, MD       traZODone  (DESYREL ) tablet 25 mg  25 mg Oral QHS PRN Alfornia Madison, MD       Current  Outpatient Medications  Medication Sig Dispense Refill   acetaminophen  (TYLENOL ) 325 MG tablet Take 650 mg by mouth every 6 (six) hours as needed for mild pain (pain score 1-3) or moderate pain (pain score 4-6).     albuterol  (PROVENTIL ) (2.5 MG/3ML) 0.083% nebulizer solution Take 3 mLs (2.5 mg total) by nebulization every 6 (six) hours as needed for wheezing or shortness of breath. 75 mL 12   albuterol  (VENTOLIN  HFA) 108 (90 Base) MCG/ACT inhaler Inhale 2 puffs into the lungs every 6 (six) hours as needed for shortness of breath.     amLODipine  (NORVASC ) 10 MG tablet Take 1 tablet (10 mg total) by mouth daily. 30 tablet 0   apixaban  (ELIQUIS ) 2.5 MG TABS tablet Take 1 tablet (2.5 mg total) by mouth 2 (two) times daily. 60 tablet 0   Blood Pressure Monitoring (OMRON 3 SERIES BP MONITOR) DEVI Use as directed. 1 each 0   carvedilol  (COREG ) 25 MG tablet Take 1 tablet (25 mg total) by mouth 2 (two) times daily with a meal. 60 tablet 1   Cholecalciferol  (VITAMIN D3) 50 MCG (2000 UT) TABS Take 1 tablet by mouth daily.     cinacalcet  (SENSIPAR ) 60 MG tablet Take 60 mg by mouth every dialysis (Tuesday, Thursday, and Saturday).     cloNIDine  (CATAPRES  - DOSED IN MG/24 HR) 0.2 mg/24hr patch Place 0.2 mg onto the skin once a week.     hydrALAZINE  (APRESOLINE ) 100 MG tablet Take 1 tablet (100 mg total) by mouth every 8 (eight) hours. 270 tablet 3   isosorbide  mononitrate (IMDUR ) 30 MG 24 hr tablet TAKE 1 TABLET (30MG ) ON DIALYSIS DAYS AND 2 TABLETS (60 MG) ON NON DIALYSIS DAYS. 135 tablet 3   LOKELMA  10 g PACK packet Take 1 packet (10 g) by mouth every Monday, Wednesday, and Friday. 30 each 0   naloxone  (NARCAN ) nasal spray 4 mg/0.1 mL Place 1 spray into the nose as needed (opioid overdose).     oxyCODONE -acetaminophen  (PERCOCET) 10-325 MG tablet Take 1 tablet by mouth every 8 (eight) hours as needed for pain.     pantoprazole  (PROTONIX ) 40 MG tablet TAKE 1 TABLET (40 MG TOTAL) BY MOUTH 2 (TWO) TIMES DAILY. 60  tablet 1   rosuvastatin  (CRESTOR ) 10 MG tablet TAKE 1 TABLET (10 MG TOTAL) BY MOUTH DAILY FOR CHOLESTEROL 90 tablet 0   sevelamer  carbonate (RENVELA ) 2.4 g PACK Take 2.4 g by mouth daily.     traZODone  (DESYREL ) 50 MG tablet Take 25-50 mg by mouth at bedtime as needed for sleep.       ALLERGIES Shrimp [shellfish allergy], Bactroban  [mupirocin ], Hydrocodone , Chlorhexidine  gluconate, Latex, Tylenol  [acetaminophen ], and Zestril  [lisinopril ]  MEDICAL HISTORY Past Medical History:  Diagnosis Date   Acute cardioembolic stroke (HCC) 02/26/2022   AF (paroxysmal atrial fibrillation) (HCC) 05/29/2019  on Coumadin    Aortic atherosclerosis 07/05/2019   Aortic dissection (HCC) 04/04/2019   s/p repair   Bone spur 2008   Right calcaneal foot spur   Breast cancer (HCC) 2004   Ductal carcinoma in situ of the left breast; S/P left partial mastectomy 02/26/2003; S/P re-excision of cranial and lateral margins11/18/2004.radiation   Carotid artery disease    Cerebral thrombosis with cerebral infarction 05/22/2019   Chronic HFrEF (heart failure with reduced ejection fraction) (HCC)    Chronic low back pain 06/22/2016   Chronic obstructive lung disease (HCC) 01/16/2017   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 Deward Null; S/P re-excision of superior and inferior margins 10/27/2012.    Dissection of aorta (HCC) 04/03/2019   ESRD on hemodialysis (HCC) 05/29/2019   Essential hypertension 09/16/2006   GERD 09/16/2006   Hepatitis C    treated and RNA confirmed not detectable 01/2017   Insomnia 03/14/2015   Malnutrition of moderate degree 05/19/2019   Non compliance with medical treatment 12/04/2017   Normocytic anemia    With thrombocytosis   Osteoarthritis    Right ureteral stone 2002   S/P lumbar spinal fusion 01/18/2014   S/P lumbar decompressive laminectomy, fusion, and plating for lumbar spinal stenosis on 05/27/2009 by Dr. Alm CANDIE Molt.   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 Alm CANDIE Molt.     Stroke (cerebrum) (HCC)    Stroke (HCC) 02/23/2022   SVT (supraventricular tachycardia) 08/28/2019   Tobacco use disorder 04/19/2009   Uterine fibroid    Wears dentures    top     SOCIAL HISTORY Social History   Socioeconomic History   Marital status: Single    Spouse name: Not on file   Number of children: 2   Years of education: Not on file   Highest education level: Not on file  Occupational History   Occupation: disabled  Tobacco Use   Smoking status: Former    Current packs/day: 0.00    Average packs/day: 0.3 packs/day for 44.0 years (11.0 ttl pk-yrs)    Types: Cigarettes    Start date: 33    Quit date: 2022    Years since quitting: 4.0   Smokeless tobacco: Never   Tobacco comments:    Former smoker 04/21/23  Vaping Use   Vaping status: Never Used  Substance and Sexual Activity   Alcohol  use: Not Currently    Alcohol /week: 2.0 standard drinks of alcohol     Types: 2 Cans of beer per week    Comment: couple beers a weekend, not currently   Drug use: Not Currently    Types: Cocaine     Comment: last in 2005   Sexual activity: Yes    Birth control/protection: Post-menopausal  Other Topics Concern   Not on file  Social History Narrative   Not on file   Social Drivers of Health   Tobacco Use: Medium Risk (06/16/2024)   Patient History    Smoking Tobacco Use: Former    Smokeless Tobacco Use: Never    Passive Exposure: Not on Actuary Strain: Not on file  Food Insecurity: No Food Insecurity (05/27/2024)   Epic    Worried About Radiation Protection Practitioner of Food in the Last Year: Never true    [employer]  in the Last Year: Never true  Transportation Needs: No Transportation Needs (05/27/2024)   Epic     Lack of Transportation (Medical): No    Lack of Transportation (Non-Medical): No  Physical Activity: Not on file  Stress: Not on file  Social Connections: Socially Isolated (05/27/2024)   Social Connection and Isolation Panel    Frequency of Communication with Friends and Family: More than three times a week    Frequency of Social Gatherings with Friends and Family: More than three times a week    Attends Religious Services: Patient declined    Active Member of Clubs or Organizations: No    Attends Banker Meetings: Never    Marital Status: Separated  Intimate Partner Violence: Not At Risk (05/27/2024)   Epic    Fear of Current or Ex-Partner: No    Emotionally Abused: No    Physically Abused: No    Sexually Abused: No  Depression (PHQ2-9): Low Risk (06/17/2022)   Depression (PHQ2-9)    PHQ-2 Score: 1  Recent Concern: Depression (PHQ2-9) - Medium Risk (05/22/2022)   Depression (PHQ2-9)    PHQ-2 Score: 7  Alcohol  Screen: Not on file  Housing: Low Risk (05/27/2024)   Epic    Unable to Pay for Housing in the Last Year: No    Number of Times Moved in the Last Year: 0    Homeless in the Last Year: No  Utilities: Not At Risk (05/27/2024)   Epic    Threatened with loss of utilities: No  Health Literacy: Not on file     FAMILY HISTORY Family History  Problem Relation Age of Onset   Colon cancer Mother 34   Hypertension Mother    Diabetes Sister 49   Hypertension Sister    Diabetes Brother    Hypertension Brother    Diabetes Brother    Hypertension Brother    Kidney disease Son        s/p renal transplant   Hypertension Son    Diabetes Son    Multiple sclerosis Son    Bone cancer Sister 10   Breast cancer Neg Hx    Cervical cancer Neg Hx      Review of Systems: 12 systems were reviewed and negative except per HPI  Physical Exam: Vitals:   06/17/24 0645 06/17/24 0700  BP: (!) 124/103 (!) 149/96  Pulse: (!) 117 92  Resp: (!) 25 (!) 29  Temp:     SpO2: 100% 100%   No intake/output data recorded.  Intake/Output Summary (Last 24 hours) at 06/17/2024 0727 Last data filed at 06/16/2024 2200 Gross per 24 hour  Intake --  Output 1900 ml  Net -1900 ml   General: Chronically ill-appearing, no acute distress, laying flat in bed HEENT: anicteric sclera, MMM CV: Irregularly irregular no murmurs, no edema Lungs: Decreased breath sounds bibasilar, bilateral chest rise, normal wob Abd: soft, non-tender, non-distended Skin: no visible lesions or rashes Neuro: normal speech, no gross focal deficits  Dialysis access: [facility]  Test Results Reviewed Lab Results  Component Value Date   NA 131 (L) 06/17/2024   K 5.2 (H) 06/17/2024   CL 93 (L) 06/17/2024   CO2 21 (L) 06/17/2024   BUN 19 06/17/2024   CREATININE 5.08 (H) 06/17/2024   GFR 7.97 (LL) 02/14/2021   CALCIUM  9.1 06/17/2024   ALBUMIN  4.1 06/15/2024   PHOS 4.6 05/30/2024    I have reviewed relevant outside healthcare records

## 2024-06-17 NOTE — ED Provider Notes (Signed)
 Patient seen after returning from dialysis.  Patient still persistently tachycardic and tachypneic while on home oxygen .  Patient appeared to have a CHF exacerbation plus or minus pneumonia.  Will get patient admitted to internal medicine.  Consulted cardiology, Dr. Winfred who recommended pushes of Lopressor  given patient's history of heart failure  Consulted hospitalist, Dr. Alfornia who is agreeable to admission   Francis Ileana SAILOR, PA-C 06/17/24 0414    Simon Lavonia SAILOR, MD 06/17/24 [phone]

## 2024-06-17 NOTE — ED Notes (Signed)
 Only got one blood culture pt was a difficult stick RN aware

## 2024-06-17 NOTE — ED Notes (Addendum)
 Pt's tachy between 120s-150s. MD made aware via secure chat. Coreg  given. Was instructed to keep monitoring.

## 2024-06-17 NOTE — Progress Notes (Signed)
 Subjective: Patient admitted this morning, see detailed H&P by Dr Alfornia 74 y.o. female with medical history significant of ESRD on HD TTS, hypertension, hyperlipidemia, HFmrEF, paroxysmal A-fib on Eliquis , COPD, chronic hypoxemic respiratory failure on home oxygen , CVA with residual right hemiparesis, carotid artery disease, aortic dissection s/p repair, breast cancer, GERD, insomnia, hepatitis C, SVT, tobacco and cocaine  abuse, noncompliance with medical treatment.  Admitted to the hospital last month 12/27-12/30 for acute on chronic hypoxemic respiratory failure due to fluid overload and running out of her home oxygen .  She underwent serial dialysis with improvement of her respiratory status.  Nephrology consulted and patient was taken for urgent dialysis.  While at dialysis, patient became tachycardic to the 150s and was noted to be in A-fib with RVR.  She was given IV metoprolol  5 mg and IV calcium  gluconate 1 g.  Heart rate subsequently came down to 110s after interventions and patient was able to finish dialysis.  After returning to the ED from dialysis, patient became tachycardic to the 130s again.  ED PA discussed the case with cardiology and they recommended giving pushes of IV metoprolol  and avoiding Cardizem  drip given history of heart failure.  She was given additional IV metoprolol  5 mg.  Patient also received ceftriaxone , DuoNeb, morphine , and Zofran  in the ED.  Repeat labs after dialysis showing improvement of potassium to 5.2.  TRH called to admit.   Patient is a poor historian.  She reports missing dialysis on Thursday and having shortness of breath since yesterday.  Her shortness of breath has now improved after dialysis tonight.  Denies chest pain.  She is also reporting generalized abdominal pain since yesterday morning but denies vomiting or constipation.  Last bowel movement was yesterday morning.  She reports taking all of her home medications.  Not able to give any additional  history.   Vitals:   06/17/24 0645 06/17/24 0700  BP: (!) 124/103 (!) 149/96  Pulse: (!) 117 92  Resp: (!) 25 (!) 29  Temp:    SpO2: 100% 100%      A/P  Volume overload/pulmonary edema and severe hyperkalemia in the setting of missed hemodialysis ESRD on HD TTS Underwent urgent dialysis and potassium improved on repeat labs.  Nephrology consulting.   Acute on chronic HFmrEF Last echo done in September 2025 showing EF 45 to 50%, grade 2 diastolic dysfunction, RV systolic function mildly reduced, moderate biatrial dilation, and moderate to severe tricuspid regurgitation.  Presenting with volume overload in the setting of missed hemodialysis.  proBNP >35,000.  Chest x-ray showing mild pulmonary edema and new small right pleural effusion.  Volume management with dialysis as above.   A-fib with RVR In the setting of problems listed above.  Heart rate has now improved after doses of IV metoprolol .  Continue home Coreg  and Eliquis .  Check magnesium  level.   Hypertensive urgency Due to noncompliance/volume overload.  Underwent urgent dialysis and blood pressure now improved.  Continue home amlodipine , Coreg , Imdur , and hydralazine .  She is on weekly clonidine  patch but unclear when it was last placed.     ?Pneumonia Chest x-ray showing new right basilar airspace opacity.  COVID/influenza/RSV PCR negative.  No fever or leukocytosis.  .  Blood cultures ordered . Procalcitonin is 3.45.  Continue with ceftriaxone  and doxycycline    Generalized abdominal pain Patient was seen in the ED 2 days ago and lipase and LFTs were normal.  CT abdomen pelvis was showing findings consistent with volume overload but no other acute findings.  QT prolongation Monitor electrolytes and avoid QT prolonging drugs.   Hyperlipidemia Continue Crestor .   COPD Chronic hypoxemic respiratory failure on home oxygen  Currently saturating well on 3 L [place] which appears to be her baseline.  No wheezing on exam.    History of CVA Continue Eliquis  and Crestor .   GERD Continue Protonix .   Insomnia Continue trazodone  PRN.   Chronic anemia Hemoglobin stable.    Monique Henderson [facility]

## 2024-06-17 NOTE — H&P (Signed)
 " History and Physical    Monique Henderson FMW:[id number] DOB: 03/22/1951 DOA: 06/16/2024  PCP: Shelda Atlas, MD  Patient coming from: Home  Chief Complaint: Shortness of breath  HPI: Monique Henderson is a 74 y.o. female with medical history significant of ESRD on HD TTS, hypertension, hyperlipidemia, HFmrEF, paroxysmal A-fib on Eliquis , COPD, chronic hypoxemic respiratory failure on home oxygen , CVA with residual right hemiparesis, carotid artery disease, aortic dissection s/p repair, breast cancer, GERD, insomnia, hepatitis C, SVT, tobacco and cocaine  abuse, noncompliance with medical treatment.  Admitted to the hospital last month 12/27-12/30 for acute on chronic hypoxemic respiratory failure due to fluid overload and running out of her home oxygen .  She underwent serial dialysis with improvement of her respiratory status.  Patient presents to the ED tonight via EMS for evaluation of shortness of breath.  She wears 4 L [place] at baseline but per EMS she was not on her home oxygen  on arrival and was noted to be saturating 86% on room air.  She missed her last dialysis treatment on Thursday.  Patient was tachycardic (initially in sinus rhythm), tachypneic, and hypertensive with SBP in the 200s on arrival to the ED.  Afebrile.  Saturating 100% on 4 L [place].  Labs showing no leukocytosis, hemoglobin 9.2 (stable), potassium 7.2, bicarb 23, proBNP >35,000, COVID/influenza/RSV PCR negative, lactic acid normal.  Chest x-ray showing new right basilar airspace opacity, increased diffuse interstitial prominence consistent with mild pulmonary edema, and new small right pleural effusion..  Nephrology consulted and patient was taken for urgent dialysis.  While at dialysis, patient became tachycardic to the 150s and was noted to be in A-fib with RVR.  She was given IV metoprolol  5 mg and IV calcium  gluconate 1 g.  Heart rate subsequently came down to 110s after interventions and patient was able to finish dialysis.   After returning to the ED from dialysis, patient became tachycardic to the 130s again.  ED PA discussed the case with cardiology and they recommended giving pushes of IV metoprolol  and avoiding Cardizem  drip given history of heart failure.  She was given additional IV metoprolol  5 mg.  Patient also received ceftriaxone , DuoNeb, morphine , and Zofran  in the ED.  Repeat labs after dialysis showing improvement of potassium to 5.2.  TRH called to admit.  Patient is a poor historian.  She reports missing dialysis on Thursday and having shortness of breath since yesterday.  Her shortness of breath has now improved after dialysis tonight.  Denies chest pain.  She is also reporting generalized abdominal pain since yesterday morning but denies vomiting or constipation.  Last bowel movement was yesterday morning.  She reports taking all of her home medications.  Not able to give any additional history.  Review of Systems:  Review of Systems  All other systems reviewed and are negative.   Past Medical History:  Diagnosis Date   Acute cardioembolic stroke (HCC) 02/26/2022   AF (paroxysmal atrial fibrillation) (HCC) 05/29/2019   on Coumadin    Aortic atherosclerosis 07/05/2019   Aortic dissection (HCC) 04/04/2019   s/p repair   Bone spur 2008   Right calcaneal foot spur   Breast cancer (HCC) 2004   Ductal carcinoma in situ of the left breast; S/P left partial mastectomy 02/26/2003; S/P re-excision of cranial and lateral margins11/18/2004.radiation   Carotid artery disease    Cerebral thrombosis with cerebral infarction 05/22/2019   Chronic HFrEF (heart failure with reduced ejection fraction) (HCC)    Chronic low back pain 06/22/2016  Chronic obstructive lung disease (HCC) 01/16/2017   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 Deward Null; S/P re-excision of superior and inferior margins 10/27/2012.    Dissection of aorta (HCC) 04/03/2019   ESRD on  hemodialysis (HCC) 05/29/2019   Essential hypertension 09/16/2006   GERD 09/16/2006   Hepatitis C    treated and RNA confirmed not detectable 01/2017   Insomnia 03/14/2015   Malnutrition of moderate degree 05/19/2019   Non compliance with medical treatment 12/04/2017   Normocytic anemia    With thrombocytosis   Osteoarthritis    Right ureteral stone 2002   S/P lumbar spinal fusion 01/18/2014   S/P lumbar decompressive laminectomy, fusion, and plating for lumbar spinal stenosis on 05/27/2009 by Dr. Alm CANDIE Molt.  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 Alm CANDIE Molt.     Stroke (cerebrum) (HCC)    Stroke (HCC) 02/23/2022   SVT (supraventricular tachycardia) 08/28/2019   Tobacco use disorder 04/19/2009   Uterine fibroid    Wears dentures    top    Past Surgical History:  Procedure Laterality Date   ANTERIOR LAT LUMBAR FUSION N/A 01/18/2014   Procedure: ANTERIOR LATERAL LUMBAR FUSION LUMBAR TWO-THREE;  Surgeon: Alm GORMAN Molt, MD;  Location: [facility];  Service: Neurosurgery;  Laterality: N/A;   ANTERIOR LUMBAR FUSION  01/18/2014   AV FISTULA PLACEMENT Left 04/20/2019   Procedure: ARTERIOVENOUS (AV) FISTULA CREATION;  Surgeon: Sheree Penne Bruckner, MD;  Location: [facility];  Service: Vascular;  Laterality: Left;   BACK SURGERY     BREAST LUMPECTOMY Left 01/2003   BREAST LUMPECTOMY Right 2014   BREAST LUMPECTOMY WITH NEEDLE LOCALIZATION AND AXILLARY SENTINEL LYMPH NODE BX Right 10/13/2012   Procedure: BREAST LUMPECTOMY WITH NEEDLE LOCALIZATION;  Surgeon: Deward GORMAN Null DOUGLAS, MD;  Location: [facility];  Service: General;  Laterality: Right;  Right breast wire localized lumpectomy   DIALYSIS/PERMA CATHETER INSERTION N/A 05/28/2023   Procedure: DIALYSIS/PERMA CATHETER INSERTION;   Surgeon: Melia Lynwood ORN, MD;  Location: [facility];  Service: Cardiovascular;  Laterality: N/A;   INSERTION OF DIALYSIS CATHETER Right 04/20/2019   Procedure: INSERTION OF DIALYSIS CATHETER, right internal jugular;  Surgeon: Sheree Penne Bruckner, MD;  Location: [facility] OR;  Service: Vascular;  Laterality: Right;   INSERTION OF DIALYSIS CATHETER Right 09/24/2020   Procedure: INSERTION OF TUNNELED DIALYSIS CATHETER;  Surgeon: Oris Krystal FALCON, MD;  Location: [facility] OR;  Service: Vascular;  Laterality: Right;   IR FLUORO GUIDE CV LINE RIGHT  09/22/2020   IR THORACENTESIS RIGHT ASP PLEURAL SPACE W/IMG GUIDE  05/19/2019   IR US  GUIDE VASC ACCESS LEFT  09/22/2020   IR US  GUIDE VASC ACCESS RIGHT  09/22/2020   IR VENOCAVAGRAM SVC  09/22/2020   LAMINECTOMY  05/27/2009   Lumbar decompressive laminectomy, fusion and plating for lumbar spinal stensosis   LIGATION OF ARTERIOVENOUS  FISTULA Left 09/24/2020   Procedure: LIGATION OF LEFT ARM ARTERIOVENOUS  FISTULA;  Surgeon: Oris Krystal FALCON, MD;  Location[facility] OR;  Service: Vascular;  Laterality: Left;   LUMBAR LAMINECTOMY/DECOMPRESSION MICRODISCECTOMY Left 03/23/2013   Procedure: LUMBAR LAMINECTOMY/DECOMPRESSION MICRODISCECTOMY 1 LEVEL;  Surgeon: Alm GORMAN Molt, MD;  Location: [facility];  Service: Neurosurgery;  Laterality: Left;  LUMBAR LAMINECTOMY/DECOMPRESSION MICRODISCECTOMY 1 LEVEL  MASTECTOMY, PARTIAL Left 02/26/2003   ; S/P re-excision of cranial and lateral margins 04/19/2003.    RE-EXCISION OF BREAST CANCER,SUPERIOR MARGINS Right 10/27/2012   Procedure: RE-EXCISION OF BREAST CANCER,SUPERIOR and inferior MARGINS;  Surgeon: Deward GORMAN Curvin DOUGLAS, MD;  Location: [facility] OR;  Service: General;  Laterality: Right;   RE-EXCISION OF BREAST LUMPECTOMY Left 04/2003   TEE WITHOUT CARDIOVERSION N/A 04/04/2019   Procedure: Transesophageal Echocardiogram (Tee);  Surgeon: German Bartlett PEDLAR, MD;  Location: [facility] OR;  Service: Open Heart Surgery;  Laterality: N/A;   THORACIC AORTIC  ANEURYSM REPAIR N/A 04/04/2019   Procedure: THORACIC ASCENDING ANEURYSM REPAIR (AAA)  USING 28 MM X 30 CM HEMASHIELD PLATINUM VASCULAR GRAFT;  Surgeon: German Bartlett PEDLAR, MD;  Location: [facility] OR;  Service: Open Heart Surgery;  Laterality: N/A;     reports that she quit smoking about 4 years ago. Her smoking use included cigarettes. She started smoking about 48 years ago. She has a 11 pack-year smoking history. She has never used smokeless tobacco. She reports that she does not currently use alcohol  after a past usage of about 2.0 standard drinks of alcohol  per week. She reports that she does not currently use drugs after having used the following drugs: Cocaine .  Allergies[1]  Family History  Problem Relation Age of Onset   Colon cancer Mother 19   Hypertension Mother    Diabetes Sister 60   Hypertension Sister    Diabetes Brother    Hypertension Brother    Diabetes Brother    Hypertension Brother    Kidney disease Son        s/p renal transplant   Hypertension Son    Diabetes Son    Multiple sclerosis Son    Bone cancer Sister 7   Breast cancer Neg Hx    Cervical cancer Neg Hx     Prior to Admission medications  Medication Sig Start Date End Date Taking? Authorizing Provider  acetaminophen  (TYLENOL ) 325 MG tablet Take 650 mg by mouth every 6 (six) hours as needed for mild pain (pain score 1-3) or moderate pain (pain score 4-6).    [provider]  albuterol  (PROVENTIL ) (2.5 MG/3ML) 0.083% nebulizer solution Take 3 mLs (2.5 mg total) by nebulization every 6 (six) hours as needed for wheezing or shortness of breath. 08/23/23   Pokhrel, Vernal, MD  albuterol  (VENTOLIN  HFA) 108 (90 Base) MCG/ACT inhaler Inhale 2 puffs into the lungs every 6 (six) hours as needed for shortness of breath. 10/02/23   [provider]  amLODipine  (NORVASC ) 10 MG tablet Take 1 tablet (10 mg total) by mouth daily. 03/06/22   Setzer, Sandra J, PA-C  apixaban  (ELIQUIS ) 2.5 MG TABS tablet Take 1  tablet (2.5 mg total) by mouth 2 (two) times daily. 03/06/22   Setzer, Sandra J, PA-C  Blood Pressure Monitoring (OMRON 3 SERIES BP MONITOR) DEVI Use as directed. 03/29/24   Tolia, Sunit, DO  carvedilol  (COREG ) 25 MG tablet Take 1 tablet (25 mg total) by mouth 2 (two) times daily with a meal. 05/01/22 06/16/24  Ghimire, Donalda HERO, MD  Cholecalciferol  (VITAMIN D3) 50 MCG (2000 UT) TABS Take 1 tablet by mouth daily. 04/21/24   [provider]  cinacalcet  (SENSIPAR ) 60 MG tablet Take 60 mg by mouth every dialysis (Tuesday, Thursday, and Saturday).    [provider]  cloNIDine  (CATAPRES  - DOSED IN MG/24 HR) 0.2 mg/24hr patch Place 0.2 mg onto the skin once a week. 02/25/24   [provider]  hydrALAZINE  (APRESOLINE ) 100 MG tablet Take 1 tablet (100 mg total) by mouth every 8 (eight) hours. 12/18/22   Dann Candyce RAMAN, MD  isosorbide  mononitrate (IMDUR ) 30 MG 24 hr tablet TAKE 1 TABLET (30MG ) ON DIALYSIS DAYS AND 2 TABLETS (60 MG) ON NON DIALYSIS DAYS. 12/22/23   Michele, Sunit, DO  LOKELMA  10 g PACK packet Take 1 packet (10 g) by mouth every Monday, Wednesday, and Friday. 05/31/24   Singh, Prashant K, MD  naloxone  (NARCAN ) nasal spray 4 mg/0.1 mL Place 1 spray into the nose as needed (opioid overdose). 02/19/22   [provider]  oxyCODONE -acetaminophen  (PERCOCET) 10-325 MG tablet Take 1 tablet by mouth every 8 (eight) hours as needed for pain.    [provider]  pantoprazole  (PROTONIX ) 40 MG tablet TAKE 1 TABLET (40 MG TOTAL) BY MOUTH 2 (TWO) TIMES DAILY. 05/13/23   Tanda Bleacher, MD  rosuvastatin  (CRESTOR ) 10 MG tablet TAKE 1 TABLET (10 MG TOTAL) BY MOUTH DAILY FOR CHOLESTEROL 08/07/22   Tanda Bleacher, MD  sevelamer  carbonate (RENVELA ) 2.4 g PACK Take 2.4 g by mouth daily.    [provider]  traZODone  (DESYREL ) 50 MG tablet Take 25-50 mg by mouth at bedtime as needed for sleep. 03/19/23   [provider]    Physical Exam: Vitals:    06/17/24 0345 06/17/24 0430 06/17/24 0440 06/17/24 0445  BP: (!) 145/116 (!) 150/96    Pulse: (!) 132  (!) 107 (!) 103  Resp: (!) 24  (!) 29 (!) 30  Temp:      TempSrc:      SpO2: 100%  100% 100%  Weight:      Height:        Physical Exam Vitals reviewed.  Constitutional:      General: She is not in acute distress. HENT:     Head: Normocephalic and atraumatic.  Eyes:     Extraocular Movements: Extraocular movements intact.  Cardiovascular:     Rate and Rhythm: Tachycardia present. Rhythm irregular.  Pulmonary:     Effort: Pulmonary effort is normal. No respiratory distress.     Breath sounds: No wheezing.  Abdominal:     General: Bowel sounds are normal.     Palpations: Abdomen is soft.     Tenderness: There is abdominal tenderness. There is no guarding or rebound.     Comments: Generalized tenderness to palpation  Musculoskeletal:     Cervical back: Normal range of motion.     Right lower leg: No edema.     Left lower leg: No edema.  Skin:    General: Skin is warm and dry.  Neurological:     General: No focal deficit present.     Mental Status: She is alert and oriented to person, place, and time.     Labs on Admission: I have personally reviewed following labs and imaging studies  CBC: Recent Labs  Lab 06/15/24 1627 06/15/24 1834 06/16/24 1957 06/16/24 2002  WBC  --  8.1  --  7.2  NEUTROABS  --  5.8  --  5.6  HGB 11.2* 9.9* 10.5* 9.2*  HCT 33.0* 32.7* 31.0* 30.4*  MCV  --  98.5  --  96.8  PLT  --  235  --  216   Basic Metabolic Panel: Recent Labs  Lab 06/15/24 1620 06/15/24 1627 06/16/24 1957 06/16/24 2002 06/17/24 0303  NA 141 140 135 137 131*  K 5.8* 5.4* 6.9* 7.2* 5.2*  CL 99 101 102 95* 93*  CO2 26  --   --  23 21*  GLUCOSE 87 93 131* 131* 93  BUN 36* 40* 52* 52* 19  CREATININE 9.15* 10.10* 12.80* 11.20* 5.08*  CALCIUM  9.1  --   --  9.6 9.1   GFR: Estimated Creatinine Clearance: 7.3 mL/min (A) (by C-G formula based on SCr of 5.08 mg/dL  (H)). Liver Function Tests: Recent Labs  Lab 06/15/24 1620  AST 24  ALT <5  ALKPHOS 75  BILITOT 0.4  PROT 7.5  ALBUMIN  4.1   Recent Labs  Lab 06/15/24 1620  LIPASE 31   No results for input(s): AMMONIA in the last 168 hours. Coagulation Profile: Recent Labs  Lab 06/16/24 2001  INR 1.3*   Cardiac Enzymes: No results for input(s): CKTOTAL, CKMB, CKMBINDEX, TROPONINI in the last 168 hours. BNP (last 3 results) Recent Labs    05/27/24 1051 06/15/24 1620 06/16/24 2002  PROBNP >35,000.0* >35,000.0* >35,000.0*   HbA1C: No results for input(s): HGBA1C in the last 72 hours. CBG: No results for input(s): GLUCAP in the last 168 hours. Lipid Profile: No results for input(s): CHOL, HDL, LDLCALC, TRIG, CHOLHDL, LDLDIRECT in the last 72 hours. Thyroid  Function Tests: No results for input(s): TSH, T4TOTAL, FREET4, T3FREE, THYROIDAB in the last 72 hours. Anemia Panel: No results for input(s): VITAMINB12, FOLATE, FERRITIN, TIBC, IRON, RETICCTPCT in the last 72 hours. Urine analysis:    Component Value Date/Time   COLORURINE AMBER (A) 12/27/2021 1840   APPEARANCEUR TURBID (A) 12/27/2021 1840   LABSPEC 1.013 12/27/2021 1840   PHURINE 8.0 12/27/2021 1840   GLUCOSEU NEGATIVE 12/27/2021 1840   GLUCOSEU NEG mg/dL 93/98/7988 8055   HGBUR MODERATE (A) 12/27/2021 1840   BILIRUBINUR NEGATIVE 12/27/2021 1840   BILIRUBINUR negative 05/04/2018 0910   KETONESUR NEGATIVE 12/27/2021 1840   PROTEINUR >=300 (A) 12/27/2021 1840   UROBILINOGEN 0.2 05/04/2018 0910   UROBILINOGEN 1 02/14/2014 0946   NITRITE NEGATIVE 12/27/2021 1840   LEUKOCYTESUR LARGE (A) 12/27/2021 1840    Radiological Exams on Admission: DG Chest Port 1 View Result Date: 06/16/2024 EXAM: 1 VIEW(S) XRAY OF THE CHEST 06/16/2024 05:25:00 PM COMPARISON: 06/15/2024 CLINICAL HISTORY: dib FINDINGS: LINES, TUBES AND DEVICES: Right chest dialysis catheter stable in position. LUNGS  AND PLEURA: New right basilar airspace opacity. Small right pleural effusion. Mild diffuse interstitial prominence consistent with mild pulmonary edema. No pneumothorax. HEART AND MEDIASTINUM: Cardiomegaly, unchanged. Atherosclerotic calcifications. BONES AND SOFT TISSUES: Post median sternotomy noted. Multilevel degenerative changes of thoracic spine. IMPRESSION: 1. New right basilar airspace opacity. 2. Increasing diffuse interstitial prominence consistent with mild pulmonary edema. 3. New small right pleural effusion. 4. Cardiomegaly Electronically signed by: Greig Pique MD 06/16/2024 05:49 PM EST RP Workstation: [id number]   CT ABDOMEN PELVIS W CONTRAST Result Date: 06/15/2024 EXAM: CT ABDOMEN AND PELVIS WITH CONTRAST 06/15/2024 05:43:20 PM TECHNIQUE: CT of the abdomen and pelvis was performed with the administration of 75 mL of iohexol  (OMNIPAQUE ) 350 MG/ML injection. Multiplanar reformatted images are provided for review. Automated exposure control, iterative reconstruction, and/or weight-based adjustment of the mA/kV was utilized to reduce the radiation dose to as low as reasonably achievable. COMPARISON: 08/21/2023 CLINICAL HISTORY: Abdominal pain, acute, nonlocalized. FINDINGS: LOWER CHEST: Moderate to severe cardiomegaly. Partially visualized hemodialysis catheter in the right atrium. Small right pleural effusion. Changes of asymmetric pulmonary edema in the lung bases from worse on the right than the left. Sternotomy wires. LIVER: Subcapsular cyst in the left hepatic lobe. Mild periportal edema. GALLBLADDER AND BILE DUCTS: Gallbladder is unremarkable.  No biliary ductal dilatation. SPLEEN: No acute abnormality. PANCREAS: No acute abnormality. ADRENAL GLANDS: Unchanged nodular thickening of both adrenal glands without discrete mass or dominant nodule. KIDNEYS, URETERS AND BLADDER: Severe atrophy of both kidneys. Multiple bilateral renal cysts, grossly unchanged. No stones in the kidneys or ureters. No  hydronephrosis. No perinephric or periureteral stranding. The urinary bladder is completely decompressed. GI AND BOWEL: Stomach demonstrates no acute abnormality. Descending and sigmoid colonic diverticulosis. No changes of acute diverticulitis. There is no bowel obstruction. PERITONEUM AND RETROPERITONEUM: Low volume free fluid in the pelvis. No free air. VASCULATURE: Aorta is normal in caliber. Diffuse aortoiliac atherosclerosis. LYMPH NODES: No lymphadenopathy. REPRODUCTIVE ORGANS: Multiple calcified uterine fibroids. BONES AND SOFT TISSUES: L2-L3 left lateral fusion hardware. Interspinous fusion hardware also noted at L4-L5. Multilevel degenerative disc disease of the spine. Osteopenia. Mild diffuse anasarca. No acute osseous abnormality. No focal soft tissue abnormality. IMPRESSION: 1. Mild periportal edema, small volume free fluid in the pelvis, and anasarca, likely related to the patient's volume status. 2. Descending and sigmoid colonic diverticulosis. No changes of acute diverticulitis. 3. Asymmetric pulmonary edema in the lung bases with small right pleural effusion. Electronically signed by: Rogelia Myers MD 06/15/2024 05:55 PM EST RP Workstation: [id number]   DG Chest Port 1 View Result Date: 06/15/2024 CLINICAL DATA:  Abdominal pain with some nausea and diarrhea. EXAM: PORTABLE CHEST 1 VIEW COMPARISON:  05/27/2024 FINDINGS: Right IJ dialysis catheter unchanged with tip over the right atrium. Lungs are adequately inflated without focal lobar consolidation or effusion. Minimal linear atelectasis left midlung. Mild hazy prominence of the central pulmonary vessels suggesting mild degree of vascular congestion. Mild cardiomegaly. Remainder the exam is unchanged. IMPRESSION: 1. Mild cardiomegaly with suggestion of mild vascular congestion. 2. Minimal linear atelectasis left midlung. Electronically Signed   By: Toribio Agreste M.D.   On: 06/15/2024 13:51    EKG: Independently reviewed.  Initial EKG  showing sinus rhythm, RBBB, QTc 521, and no significant change compared to previous EKGs.  Repeat EKG showing A-fib with RVR.  Assessment and Plan  Volume overload/pulmonary edema and severe hyperkalemia in the setting of missed hemodialysis ESRD on HD TTS Underwent urgent dialysis and potassium improved on repeat labs.  Nephrology consulting.  Acute on chronic HFmrEF Last echo done in September 2025 showing EF 45 to 50%, grade 2 diastolic dysfunction, RV systolic function mildly reduced, moderate biatrial dilation, and moderate to severe tricuspid regurgitation.  Presenting with volume overload in the setting of missed hemodialysis.  proBNP >35,000.  Chest x-ray showing mild pulmonary edema and new small right pleural effusion.  Volume management with dialysis as above.  A-fib with RVR In the setting of problems listed above.  Heart rate has now improved after doses of IV metoprolol .  Continue home Coreg  and Eliquis .  Check magnesium  level.  Hypertensive urgency Due to noncompliance/volume overload.  Underwent urgent dialysis and blood pressure now improved.  Continue home amlodipine , Coreg , Imdur , and hydralazine .  She is on weekly clonidine  patch but unclear when it was last placed.  Awaiting pharmacy confirmation.  ?Pneumonia Chest x-ray showing new right basilar airspace opacity.  COVID/influenza/RSV PCR negative.  No fever or leukocytosis.  Continue antibiotic coverage with ceftriaxone  and doxycycline  for now.  Check procalcitonin level.  Blood cultures ordered  Generalized abdominal pain Patient was seen in the ED 2 days ago and lipase and LFTs were normal.  CT abdomen pelvis was showing findings consistent with volume overload but no other acute findings.  QT prolongation Monitor  electrolytes and avoid QT prolonging drugs.  Hyperlipidemia Continue Crestor .  COPD Chronic hypoxemic respiratory failure on home oxygen  Currently saturating well on 3 L [place] which appears to be her  baseline.  No wheezing on exam.  History of CVA Continue Eliquis  and Crestor .  GERD Continue Protonix .  Insomnia Continue trazodone  PRN.  Chronic anemia Hemoglobin stable.  DVT prophylaxis: Eliquis  Code Status: Full Code (discussed with the patient) Level of care: Progressive Care Unit Admission status: It is my clinical opinion that referral for OBSERVATION is reasonable and necessary in this patient based on the above information provided. The aforementioned taken together are felt to place the patient at high risk for further clinical deterioration. However, it is anticipated that the patient may be medically stable for discharge from the hospital within 24 to 48 hours.  Editha Ram MD [facility]  If 7PM-7AM, please contact night-coverage [email or web address]  06/17/2024, 5:13 AM       [1]  Allergies Allergen Reactions   Shrimp [Shellfish Allergy] Shortness Of Breath   Bactroban  [Mupirocin ] Other (See Comments)    Sores in nose   Hydrocodone  Itching, Nausea And Vomiting and Nausea Only   Chlorhexidine  Gluconate Itching   Latex Itching    Latex gloves cause itchiness and a rash   Tylenol  [Acetaminophen ] Itching    Takes Percocet at home 05/06/23   Zestril  [Lisinopril ] Cough   "

## 2024-06-17 NOTE — Progress Notes (Signed)
" °   06/17/24 1736  Vitals  Temp (!) 97.5 F (36.4 C)  Pulse Rate 66  Resp (!) 22  BP (!) 170/98  SpO2 100 %  O2 Device Nasal Cannula  Oxygen  Therapy  O2 Flow Rate (L/min) 3 L/min  Patient Activity (if Appropriate) In bed  Pulse Oximetry Type Continuous  Oximetry Probe Site Changed No  Post Treatment  Dialyzer Clearance Lightly streaked  Liters Processed 78  Fluid Removed (mL) 2600 mL  Tolerated HD Treatment Yes   Received patient in bed to unit.  Alert and oriented.  Informed consent signed and in chart.   TX duration: 3.25  Patient tolerated well.  Transported back to the room  Alert, without acute distress.  Hand-off given to patient's nurse.   Access used: Yes Access issues: No  Total UF removed: 2600 Medication(s) given: See MAR Post HD VS: See Above Grid Post HD weight: Unable to get a weight   Zebedee DELENA [facility] "

## 2024-06-17 NOTE — ED Notes (Signed)
 Pt's IV was infiltrated w doxycycline . Consulted pharmacist was told to apply warm compress can do hyaluronidase antidote for refractory cases let doc know and keep an eye on it.

## 2024-06-17 NOTE — Progress Notes (Signed)
 Received report from [employer] RN

## 2024-06-17 NOTE — Consult Note (Signed)
 "  Cardiology Consultation  Patient ID: Monique Henderson MRN: [id number]; DOB: 1951-01-26  Admit date: 06/16/2024 Date of Consult: 06/17/2024  PCP:  Shelda Atlas, MD   [facility] Providers Cardiologist:  Madonna Large, DO     Patient Profile: Monique Henderson is a 74 y.o. 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 Drusilla.  History of Present Illness: Monique Henderson has past medical history as listed above.  She presented to the [facility] emergency department on 06/16/2024 with shortness of breath.  She was seen in the ER the day prior after missing dialysis session on Thursday, noted to be short of breath, having abdominal pain, nausea, vomiting.  She requested to be discharged home that day as her symptoms had resolved.  She returned to the ER on the 1/17 as her symptoms had worsened.  Of note, she had not received dialysis since Tuesday.  She was admitted twice in the last month with similar symptoms.  Relevant workup while in the ER includes: Metabolic panel showed chronic hyperkalemia as high as 7.2, creatinine consistent with ESRD, proBNP elevated at > 35,000 appears at baseline, CBC showed chronic anemia most recent hemoglobin 9.2.  CXR showed new right basilar airspace opacity, increasing diffuse interstitial prominence consistency with mild pulmonary edema, new small right pleural effusion.  She has been admitted to the medicine service, with nephrology consulted for urgent dialysis.  While in dialysis she became tachycardic with HR in the  150s, noted to be in A-fib with RVR.  She was given IV metoprolol  5 mg as well as IV calcium  gluconate 1 g.  Her HR subsequently reduced to 110s and patient was able to finish dialysis.  After returning to the emergency department from dialysis, patient was noted to become tachycardic again with HR in the 130s.  Primary team proceeded to give IV pushes of metoprolol .  After undergoing dialysis this admission, her shortness of breath is improved.  She denied any prior active chest pain.  She is a patient of Dr. Tyree, last seen 03/2024 as an outpatient in the setting of hospital follow up.  During her admission in September 2025 she was offered a left heart catheterization for further evaluation of her arteries, she had deferred at that time.  At follow-up, she was noted to be doing well, without any chest pain.  She noted that her shortness of breath was chronic and stable.  SBP postdialysis was between 126-135, she notes that she typically does not check her blood pressure on nondialysis days.  Her medication regimen at this time included: Eliquis  2.5 mg twice daily, amlodipine  10 mg daily, carvedilol  25 mg twice daily, clonidine  patch 0.2 mg weekly, hydralazine  100 mg 3 times daily, Imdur  30 mg on dialysis days, 60 mg on nondialysis days.  Patient underwent cardiac MRI as an outpatient November 2025 that showed: Mild to moderate LV dilation, moderate concentric LVH, LVEF 39%, study not highly suggestive of cardiac amyloidosis, LGE pattern not typical of amyloidosis, ECV < 40%.  Patient also noted to have normal PYP scan.  Most recent echocardiogram from September  2025: LVEF 45 to 50%, severe LVH, G2 DD, mildly reduced RV systolic function, moderate biatrial enlargement, normal IVC, moderate to severe TR.  Upon my examination of the patient, she is resting and wakes momentarily. She answers some of my yes/no questions but does not provide much history otherwise. There is no family present in room. She is  able to tell me that she feels much better in terms of shortness of breath. She is presently in sinus with significant ectopy but HR improved to 90-110s when I was in the room with her. She reports no palpitations.  Past Medical History:  Diagnosis Date   Acute cardioembolic stroke (HCC) 02/26/2022   AF (paroxysmal atrial fibrillation) (HCC) 05/29/2019   on Coumadin    Aortic atherosclerosis 07/05/2019   Aortic dissection (HCC) 04/04/2019   s/p repair   Bone spur 2008   Right calcaneal foot spur   Breast cancer (HCC) 2004   Ductal carcinoma in situ of the left breast; S/P left partial mastectomy 02/26/2003; S/P re-excision of cranial and lateral margins11/18/2004.radiation   Carotid artery disease    Cerebral thrombosis with cerebral infarction 05/22/2019   Chronic HFrEF (heart failure with reduced ejection fraction) (HCC)    Chronic low back pain 06/22/2016   Chronic obstructive lung disease (HCC) 01/16/2017   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 Deward Null; S/P re-excision of superior and inferior margins 10/27/2012.    Dissection of aorta (HCC) 04/03/2019   ESRD on hemodialysis (HCC) 05/29/2019   Essential hypertension 09/16/2006   GERD 09/16/2006   Hepatitis C    treated and RNA confirmed not detectable 01/2017   Insomnia 03/14/2015   Malnutrition of moderate degree 05/19/2019   Non compliance with medical treatment 12/04/2017   Normocytic anemia    With thrombocytosis   Osteoarthritis    Right ureteral stone 2002   S/P lumbar spinal fusion 01/18/2014   S/P lumbar decompressive laminectomy, fusion, and plating for lumbar spinal stenosis on 05/27/2009 by Dr. Alm CANDIE Molt.  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 Alm CANDIE Molt.     Stroke (cerebrum) (HCC)    Stroke (HCC) 02/23/2022   SVT (supraventricular tachycardia) 08/28/2019   Tobacco use disorder 04/19/2009   Uterine fibroid    Wears dentures    top   Past Surgical History:  Procedure Laterality Date   ANTERIOR LAT LUMBAR FUSION N/A 01/18/2014   Procedure: ANTERIOR LATERAL LUMBAR FUSION LUMBAR TWO-THREE;  Surgeon: Alm GORMAN Molt, MD;  Location: [facility];  Service: Neurosurgery;  Laterality: N/A;   ANTERIOR LUMBAR FUSION  01/18/2014   AV FISTULA PLACEMENT Left 04/20/2019   Procedure: ARTERIOVENOUS (AV) FISTULA CREATION;  Surgeon: Sheree Penne Bruckner, MD;  Location: [facility] OR;  Service: Vascular;  Laterality: Left;   BACK SURGERY     BREAST LUMPECTOMY Left 01/2003   BREAST LUMPECTOMY Right 2014   BREAST LUMPECTOMY WITH NEEDLE LOCALIZATION AND AXILLARY SENTINEL LYMPH NODE BX Right 10/13/2012   Procedure: BREAST LUMPECTOMY WITH NEEDLE LOCALIZATION;  Surgeon: Deward GORMAN Null DOUGLAS, MD;  Location: [facility];  Service: General;  Laterality: Right;  Right breast wire localized lumpectomy   DIALYSIS/PERMA CATHETER INSERTION N/A 05/28/2023   Procedure: DIALYSIS/PERMA CATHETER INSERTION;  Surgeon: Melia Lynwood ORN, MD;  Location: [facility] INVASIVE CV  LAB;  Service: Cardiovascular;  Laterality: N/A;   INSERTION OF DIALYSIS CATHETER Right 04/20/2019   Procedure: INSERTION OF DIALYSIS CATHETER, right internal jugular;  Surgeon: Sheree Penne Bruckner, MD;  Location: [facility] OR;  Service: Vascular;  Laterality: Right;   INSERTION OF DIALYSIS CATHETER Right 09/24/2020   Procedure: INSERTION OF TUNNELED DIALYSIS CATHETER;  Surgeon: Oris Krystal FALCON, MD;  Location: [facility] OR;  Service: Vascular;  Laterality: Right;   IR FLUORO GUIDE CV LINE RIGHT  09/22/2020   IR THORACENTESIS RIGHT ASP PLEURAL SPACE W/IMG GUIDE  05/19/2019   IR US  GUIDE VASC ACCESS LEFT  09/22/2020   IR US  GUIDE VASC ACCESS RIGHT  09/22/2020   IR VENOCAVAGRAM SVC  09/22/2020   LAMINECTOMY  05/27/2009    Lumbar decompressive laminectomy, fusion and plating for lumbar spinal stensosis   LIGATION OF ARTERIOVENOUS  FISTULA Left 09/24/2020   Procedure: LIGATION OF LEFT ARM ARTERIOVENOUS  FISTULA;  Surgeon: Oris Krystal FALCON, MD;  Location: [facility] OR;  Service: Vascular;  Laterality: Left;   LUMBAR LAMINECTOMY/DECOMPRESSION MICRODISCECTOMY Left 03/23/2013   Procedure: LUMBAR LAMINECTOMY/DECOMPRESSION MICRODISCECTOMY 1 LEVEL;  Surgeon: Alm GORMAN Molt, MD;  Location: [facility];  Service: Neurosurgery;  Laterality: Left;  LUMBAR LAMINECTOMY/DECOMPRESSION MICRODISCECTOMY 1 LEVEL   MASTECTOMY, PARTIAL Left 02/26/2003   ; S/P re-excision of cranial and lateral margins 04/19/2003.    RE-EXCISION OF BREAST CANCER,SUPERIOR MARGINS Right 10/27/2012   Procedure: RE-EXCISION OF BREAST CANCER,SUPERIOR and inferior MARGINS;  Surgeon: Deward GORMAN Curvin DOUGLAS, MD;  Location: [facility] OR;  Service: General;  Laterality: Right;   RE-EXCISION OF BREAST LUMPECTOMY Left 04/2003   TEE WITHOUT CARDIOVERSION N/A 04/04/2019   Procedure: Transesophageal Echocardiogram (Tee);  Surgeon: German Bartlett PEDLAR, MD;  Location: [facility] OR;  Service: Open Heart Surgery;  Laterality: N/A;   THORACIC AORTIC ANEURYSM REPAIR N/A 04/04/2019   Procedure: THORACIC ASCENDING ANEURYSM REPAIR (AAA)  USING 28 MM X 30 CM HEMASHIELD PLATINUM VASCULAR GRAFT;  Surgeon: German Bartlett PEDLAR, MD;  Location: [facility] OR;  Service: Open Heart Surgery;  Laterality: N/A;    Home Medications:  Prior to Admission medications  Medication Sig Start Date End Date Taking? Authorizing Provider  acetaminophen  (TYLENOL ) 325 MG tablet Take 650 mg by mouth every 6 (six) hours as needed for mild pain (pain score 1-3) or moderate pain (pain score 4-6).    [provider]  albuterol  (PROVENTIL ) (2.5 MG/3ML) 0.083% nebulizer solution Take 3 mLs (2.5 mg total) by nebulization every 6 (six) hours as needed for wheezing or shortness of breath. 08/23/23   Pokhrel, Vernal, MD  albuterol  (VENTOLIN  HFA) 108  (90 Base) MCG/ACT inhaler Inhale 2 puffs into the lungs every 6 (six) hours as needed for shortness of breath. 10/02/23   [provider]  amLODipine  (NORVASC ) 10 MG tablet Take 1 tablet (10 mg total) by mouth daily. 03/06/22   Setzer, Nena PARAS, PA-C  apixaban  (ELIQUIS ) 2.5 MG TABS tablet Take 1 tablet (2.5 mg total) by mouth 2 (two) times daily. 03/06/22   Setzer, Sandra J, PA-C  Blood Pressure Monitoring (OMRON 3 SERIES BP MONITOR) DEVI Use as directed. 03/29/24   Tolia, Sunit, DO  carvedilol  (COREG ) 25 MG tablet Take 1 tablet (25 mg total) by mouth 2 (two) times daily with a meal. 05/01/22 06/16/24  Ghimire, Donalda HERO, MD  Cholecalciferol  (VITAMIN D3) 50 MCG (2000 UT) TABS Take 1 tablet by mouth daily. 04/21/24   [provider]  cinacalcet  (SENSIPAR ) 60 MG tablet Take 60 mg by mouth every  dialysis (Tuesday, Thursday, and Saturday).    [provider]  cloNIDine  (CATAPRES  - DOSED IN MG/24 HR) 0.2 mg/24hr patch Place 0.2 mg onto the skin once a week. 02/25/24   [provider]  hydrALAZINE  (APRESOLINE ) 100 MG tablet Take 1 tablet (100 mg total) by mouth every 8 (eight) hours. 12/18/22   Dann Candyce RAMAN, MD  isosorbide  mononitrate (IMDUR ) 30 MG 24 hr tablet TAKE 1 TABLET (30MG ) ON DIALYSIS DAYS AND 2 TABLETS (60 MG) ON NON DIALYSIS DAYS. 12/22/23   Michele, Sunit, DO  LOKELMA  10 g PACK packet Take 1 packet (10 g) by mouth every Monday, Wednesday, and Friday. 05/31/24   Singh, Prashant K, MD  naloxone  (NARCAN ) nasal spray 4 mg/0.1 mL Place 1 spray into the nose as needed (opioid overdose). 02/19/22   [provider]  oxyCODONE -acetaminophen  (PERCOCET) 10-325 MG tablet Take 1 tablet by mouth every 8 (eight) hours as needed for pain.    [provider]  pantoprazole  (PROTONIX ) 40 MG tablet TAKE 1 TABLET (40 MG TOTAL) BY MOUTH 2 (TWO) TIMES DAILY. 05/13/23   Tanda Bleacher, MD  rosuvastatin  (CRESTOR ) 10 MG tablet TAKE 1 TABLET (10 MG TOTAL) BY MOUTH DAILY FOR  CHOLESTEROL 08/07/22   Tanda Bleacher, MD  sevelamer  carbonate (RENVELA ) 2.4 g PACK Take 2.4 g by mouth daily.    [provider]  traZODone  (DESYREL ) 50 MG tablet Take 25-50 mg by mouth at bedtime as needed for sleep. 03/19/23   [provider]   Scheduled Meds:  apixaban   2.5 mg Oral BID   carvedilol   25 mg Oral BID WC   cefTRIAXone  (ROCEPHIN ) IM  1 g Intramuscular Q24H   cinacalcet   60 mg Oral Q T,Th,Sa-HD   hydrALAZINE   100 mg Oral Q8H   isosorbide  mononitrate  30 mg Oral Q T,Th,Sa-HD   And   [START ON 06/18/2024] isosorbide  mononitrate  60 mg Oral Once per day on Sunday Monday Wednesday Friday   pantoprazole   40 mg Oral BID   rosuvastatin   10 mg Oral Daily   sevelamer  carbonate  2.4 g Oral Daily   [START ON 06/19/2024] sodium zirconium cyclosilicate   10 g Oral Q M,W,F   Continuous Infusions:  doxycycline  (VIBRAMYCIN ) IV Stopped (06/17/24 1038)   PRN Meds: traZODone   Allergies:   Allergies[1]  Social History:   Social History   Socioeconomic History   Marital status: Single    Spouse name: Not on file   Number of children: 2   Years of education: Not on file   Highest education level: Not on file  Occupational History   Occupation: disabled  Tobacco Use   Smoking status: Former    Current packs/day: 0.00    Average packs/day: 0.3 packs/day for 44.0 years (11.0 ttl pk-yrs)    Types: Cigarettes    Start date: 72    Quit date: 2022    Years since quitting: 4.0   Smokeless tobacco: Never   Tobacco comments:    Former smoker 04/21/23  Vaping Use   Vaping status: Never Used  Substance and Sexual Activity   Alcohol  use: Not Currently    Alcohol /week: 2.0 standard drinks of alcohol     Types: 2 Cans of beer per week    Comment: couple beers a weekend, not currently   Drug use: Not Currently    Types: Cocaine     Comment: last in 2005   Sexual activity: Yes    Birth control/protection: Post-menopausal  Other Topics Concern   Not  on file  Social  History Narrative   Not on file   Social Drivers of Health   Tobacco Use: Medium Risk (06/16/2024)   Patient History    Smoking Tobacco Use: Former    Smokeless Tobacco Use: Never    Passive Exposure: Not on Actuary Strain: Not on file  Food Insecurity: No Food Insecurity (05/27/2024)   Epic    Worried About Programme Researcher, Broadcasting/film/video in the Last Year: Never true    Ran Out of Food in the Last Year: Never true  Transportation Needs: No Transportation Needs (05/27/2024)   Epic    Lack of Transportation (Medical): No    Lack of Transportation (Non-Medical): No  Physical Activity: Not on file  Stress: Not on file  Social Connections: Socially Isolated (05/27/2024)   Social Connection and Isolation Panel    Frequency of Communication with Friends and Family: More than three times a week    Frequency of Social Gatherings with Friends and Family: More than three times a week    Attends Religious Services: Patient declined    Active Member of Clubs or Organizations: No    Attends Banker Meetings: Never    Marital Status: Separated  Intimate Partner Violence: Not At Risk (05/27/2024)   Epic    Fear of Current or Ex-Partner: No    Emotionally Abused: No    Physically Abused: No    Sexually Abused: No  Depression (PHQ2-9): Low Risk (06/17/2022)   Depression (PHQ2-9)    PHQ-2 Score: 1  Recent Concern: Depression (PHQ2-9) - Medium Risk (05/22/2022)   Depression (PHQ2-9)    PHQ-2 Score: 7  Alcohol  Screen: Not on file  Housing: Low Risk (05/27/2024)   Epic    Unable to Pay for Housing in the Last Year: No    Number of Times Moved in the Last Year: 0    Homeless in the Last Year: No  Utilities: Not At Risk (05/27/2024)   Epic    Threatened with loss of utilities: No  Health Literacy: Not on file    Family History:   Family History  Problem Relation Age of Onset   Colon cancer Mother 8   Hypertension Mother    Diabetes Sister 83   Hypertension Sister     Diabetes Brother    Hypertension Brother    Diabetes Brother    Hypertension Brother    Kidney disease Son        s/p renal transplant   Hypertension Son    Diabetes Son    Multiple sclerosis Son    Bone cancer Sister 14   Breast cancer Neg Hx    Cervical cancer Neg Hx     ROS:  Please see the history of present illness.  All other ROS reviewed and negative.     Physical Exam/Data: Vitals:   06/17/24 1039 06/17/24 1100 06/17/24 1113 06/17/24 1144  BP:  (!) 134/96    Pulse:  (!) 130 98 (!) 108  Resp:  (!) 28 (!) 21 20  Temp: 98.5 F (36.9 C)   98.7 F (37.1 C)  TempSrc: Oral   Axillary  SpO2:  100% 100% 100%  Weight:      Height:        Intake/Output Summary (Last 24 hours) at 06/17/2024 1201 Last data filed at 06/16/2024 2200 Gross per 24 hour  Intake --  Output 1900 ml  Net -1900 ml      06/17/2024  12:40 AM 06/16/2024    7:56 PM 06/16/2024    4:26 PM  Last 3 Weights  Weight (lbs) 100 lb 12 oz -- 108 lb  Weight (kg) 45.7 kg -- 48.988 kg     Body mass index is 17.85 kg/m.   General: Cachectic, chronically ill-appearing, in no acute distress, on 3 L via nasal cannula HEENT: normal Neck: no JVD Vascular:  Distal pulses 2+ bilaterally Cardiac:  normal S1, S2; RRR, ectopy Lungs:  clear to auscultation anteriorly Abd: soft, nontender, no hepatomegaly  Ext: no edema Musculoskeletal:  No deformities Skin: warm and dry  Neuro:  no focal abnormalities noted Psych:  resting  EKG:  The EKG was personally reviewed and demonstrates: Initial EKG shows sinus, chronic RBBB, HR 95  Telemetry:  Telemetry was personally reviewed and demonstrates: Sinus rhythm, HR 90-110s, frequent ectopy  Relevant CV Studies:  Cardiac MRI, 04/11/2024 Mild-moderate LV dilation with moderate concentric LV hypertrophy except for the apical septal/apical anterior/apical lateral wall segments and the true apex which were thinned and akinetic. The anterolateral and inferolateral walls  were also hypokinetic. LV EF 39%. Mildly dilated RV with RV EF 39%. Delayed enhancement pattern suggested prior MI at the apex (>50% wall thickness subendocardial LGE in the apical anterior wall and true apex). The basal inferoseptal and basal anteroseptal RV insertion site LGE is nonspecific and can be seen with pressure/volume overload. Elevated extracellular volume percentage at 38% suggests increased myocardial fibrotic content. Overall, this study is not highly suggestive of cardiac amyloidosis. The LGE pattern is not typical of amyloidosis and ECV is < 40%. Patient also noted to have had a normal PYP scan.  Echocardiogram, 02/15/2024 Left ventricular ejection fraction, by estimation, is 45 to 50% . The left ventricle has mildly decreased function. The left ventricle demonstrates regional wall motion abnormalities ( see scoring diagram/ findings for description) . There is severe concentric left ventricular hypertrophy. Left ventricular diastolic parameters are consistent with Grade II diastolic dysfunction ( pseudonormalization) . The average left ventricular global longitudinal strain is - 7. 4 % with apical sparing. The global longitudinal strain is abnormal. Consistent with cardiac amyloid.  Right ventricular systolic function is mildly reduced. The right ventricular size is normal. Moderately increased right ventricular wall thickness.  Left atrial size was moderately dilated.  Right atrial size was moderately dilated.  The mitral valve is normal in structure. Trivial mitral valve regurgitation.  Tricuspid valve regurgitation is moderate to severe.  The aortic valve is grossly normal. There is mild thickening of the aortic valve. Aortic valve regurgitation is trivial.  The inferior vena cava is normal in size with greater than 50% respiratory variability, suggesting right atrial pressure of 3 mmHg.  Carotid U/S, 12/01/2023  Laboratory Data: High Sensitivity Troponin:  No results for  input(s): TROPONINIHS in the last 720 hours.  Recent Labs  Lab 05/18/24 1218 05/27/24 1051 05/27/24 1255 06/15/24 1620 06/15/24 1834  TRNPT 107* 128* 59* 116* 110*      Chemistry Recent Labs  Lab 06/15/24 1620 06/15/24 1627 06/16/24 1957 06/16/24 2002 06/17/24 0303 06/17/24 0620  NA 141   < > 135 137 131*  --   K 5.8*   < > 6.9* 7.2* 5.2*  --   CL 99   < > 102 95* 93*  --   CO2 26  --   --  23 21*  --   GLUCOSE 87   < > 131* 131* 93  --   BUN 36*   < >  52* 52* 19  --   CREATININE 9.15*   < > 12.80* 11.20* 5.08*  --   CALCIUM  9.1  --   --  9.6 9.1  --   MG  --   --   --   --   --  2.1  GFRNONAA 4*  --   --  3* 9*  --   ANIONGAP 16*  --   --  18* 18*  --    < > = values in this interval not displayed.    Recent Labs  Lab 06/15/24 1620  PROT 7.5  ALBUMIN  4.1  AST 24  ALT <5  ALKPHOS 75  BILITOT 0.4   Lipids No results for input(s): CHOL, TRIG, HDL, LABVLDL, LDLCALC, CHOLHDL in the last 168 hours.  Hematology Recent Labs  Lab 06/15/24 1834 06/16/24 1957 06/16/24 2002  WBC 8.1  --  7.2  RBC 3.32*  --  3.14*  HGB 9.9* 10.5* 9.2*  HCT 32.7* 31.0* 30.4*  MCV 98.5  --  96.8  MCH 29.8  --  29.3  MCHC 30.3  --  30.3  RDW 19.2*  --  18.7*  PLT 235  --  216   Thyroid  No results for input(s): TSH, FREET4 in the last 168 hours.  BNP Recent Labs  Lab 06/15/24 1620 06/16/24 2002  PROBNP >35,000.0* >35,000.0*    DDimer No results for input(s): DDIMER in the last 168 hours.  Radiology/Studies:  DG Chest Port 1 View Result Date: 06/16/2024 EXAM: 1 VIEW(S) XRAY OF THE CHEST 06/16/2024 05:25:00 PM COMPARISON: 06/15/2024 CLINICAL HISTORY: dib FINDINGS: LINES, TUBES AND DEVICES: Right chest dialysis catheter stable in position. LUNGS AND PLEURA: New right basilar airspace opacity. Small right pleural effusion. Mild diffuse interstitial prominence consistent with mild pulmonary edema. No pneumothorax. HEART AND MEDIASTINUM: Cardiomegaly, unchanged.  Atherosclerotic calcifications. BONES AND SOFT TISSUES: Post median sternotomy noted. Multilevel degenerative changes of thoracic spine. IMPRESSION: 1. New right basilar airspace opacity. 2. Increasing diffuse interstitial prominence consistent with mild pulmonary edema. 3. New small right pleural effusion. 4. Cardiomegaly Electronically signed by: Greig Pique MD 06/16/2024 05:49 PM EST RP Workstation: [id number]   CT ABDOMEN PELVIS W CONTRAST Result Date: 06/15/2024 EXAM: CT ABDOMEN AND PELVIS WITH CONTRAST 06/15/2024 05:43:20 PM TECHNIQUE: CT of the abdomen and pelvis was performed with the administration of 75 mL of iohexol  (OMNIPAQUE ) 350 MG/ML injection. Multiplanar reformatted images are provided for review. Automated exposure control, iterative reconstruction, and/or weight-based adjustment of the mA/kV was utilized to reduce the radiation dose to as low as reasonably achievable. COMPARISON: 08/21/2023 CLINICAL HISTORY: Abdominal pain, acute, nonlocalized. FINDINGS: LOWER CHEST: Moderate to severe cardiomegaly. Partially visualized hemodialysis catheter in the right atrium. Small right pleural effusion. Changes of asymmetric pulmonary edema in the lung bases from worse on the right than the left. Sternotomy wires. LIVER: Subcapsular cyst in the left hepatic lobe. Mild periportal edema. GALLBLADDER AND BILE DUCTS: Gallbladder is unremarkable. No biliary ductal dilatation. SPLEEN: No acute abnormality. PANCREAS: No acute abnormality. ADRENAL GLANDS: Unchanged nodular thickening of both adrenal glands without discrete mass or dominant nodule. KIDNEYS, URETERS AND BLADDER: Severe atrophy of both kidneys. Multiple bilateral renal cysts, grossly unchanged. No stones in the kidneys or ureters. No hydronephrosis. No perinephric or periureteral stranding. The urinary bladder is completely decompressed. GI AND BOWEL: Stomach demonstrates no acute abnormality. Descending and sigmoid colonic diverticulosis. No  changes of acute diverticulitis. There is no bowel obstruction. PERITONEUM AND RETROPERITONEUM: Low volume free fluid in the  pelvis. No free air. VASCULATURE: Aorta is normal in caliber. Diffuse aortoiliac atherosclerosis. LYMPH NODES: No lymphadenopathy. REPRODUCTIVE ORGANS: Multiple calcified uterine fibroids. BONES AND SOFT TISSUES: L2-L3 left lateral fusion hardware. Interspinous fusion hardware also noted at L4-L5. Multilevel degenerative disc disease of the spine. Osteopenia. Mild diffuse anasarca. No acute osseous abnormality. No focal soft tissue abnormality. IMPRESSION: 1. Mild periportal edema, small volume free fluid in the pelvis, and anasarca, likely related to the patient's volume status. 2. Descending and sigmoid colonic diverticulosis. No changes of acute diverticulitis. 3. Asymmetric pulmonary edema in the lung bases with small right pleural effusion. Electronically signed by: Rogelia Myers MD 06/15/2024 05:55 PM EST RP Workstation: [id number]   DG Chest Port 1 View Result Date: 06/15/2024 CLINICAL DATA:  Abdominal pain with some nausea and diarrhea. EXAM: PORTABLE CHEST 1 VIEW COMPARISON:  05/27/2024 FINDINGS: Right IJ dialysis catheter unchanged with tip over the right atrium. Lungs are adequately inflated without focal lobar consolidation or effusion. Minimal linear atelectasis left midlung. Mild hazy prominence of the central pulmonary vessels suggesting mild degree of vascular congestion. Mild cardiomegaly. Remainder the exam is unchanged. IMPRESSION: 1. Mild cardiomegaly with suggestion of mild vascular congestion. 2. Minimal linear atelectasis left midlung. Electronically Signed   By: Toribio Agreste M.D.   On: 06/15/2024 13:51   Assessment and Plan:  Chronic HFmrEF Hypertension  Presented with SOB BNP chronically >35k, ESRD on HD Often missing HD sessions Underwent urgent HD yesterday, improvement in symptoms Home meds: Amlodipine  10 mg, carvedilol  25 mg BID, clonidine  patch 0.2  mg weekly, hydralazine  100 mg TID, Imdur  30 mg on dialysis days, 60 mg on nondialysis days Does not appear volume overloaded on exam Cardiac MRI 04/2024: LVEF 39% Labile BP with SBP 118-202, most recently 130s  Nephrology following, continue HD Currently on Imdur  30 mg dialysis days, 60 mg nondialysis days Currently on carvedilol  25 mg twice daily Currently on hydralazine  100 mg every 8 hours Amlodipine , clonidine  not continued for primary --follow BP readings and add back and titrate as needed  Persistent A-Fib with RVR Notes to have episodes of RVR in HD Given pushes of IV Lopressor  with improvement  Currently in sinus with HR 90-110s, frequent ectopy Home meds: Eliquis  2.5 mg BID, carvedilol  25 mg BID Continue Eliquis , carvedilol  If HR rises and is uncontrolled by current regimen, consider scheduled beta-blocker at increased dose Continue to avoid diltiazem  given CHF  Hyperlipidemia LDL 46 as of 12/2022 Continue Crestor  10 mg daily  Per primary ESRD on HD Electrolyte disturbances Possible pneumonia Abdominal pain COPD History of CVA GERD Insomnia Chronic anemia  Risk Assessment/Risk Scores:      [place]  Heart Association (NYHA) Functional Class NYHA Class II-III  CHA2DS2-VASc Score = 7   This indicates a 11.2% annual risk of stroke. The patient's score is based upon: CHF History: 1 HTN History: 1 Diabetes History: 0 Stroke History: 2 Vascular Disease History: 1 Age Score: 1 Gender Score: 1     For questions or updates, please contact [facility] Please consult [email or web address] for contact info under   Signed, Waddell DELENA Donath, PA-C  06/17/2024 12:01 PM     [1]  Allergies Allergen Reactions   Shrimp [Shellfish Allergy] Shortness Of Breath   Bactroban  [Mupirocin ] Other (See Comments)    Sores in nose   Hydrocodone  Itching, Nausea And Vomiting and Nausea Only   Chlorhexidine  Gluconate Itching   Latex Itching    Latex gloves cause  itchiness and a rash  Tylenol  [Acetaminophen ] Itching    Takes Percocet at home 05/06/23   Zestril  [Lisinopril ] Cough   "

## 2024-06-17 NOTE — ED Notes (Signed)
 Provider states to hold Pepcid  unless she becomes symptomatic.

## 2024-06-18 DIAGNOSIS — J810 Acute pulmonary edema: Secondary | ICD-10-CM | POA: Diagnosis not present

## 2024-06-18 DIAGNOSIS — R112 Nausea with vomiting, unspecified: Secondary | ICD-10-CM | POA: Diagnosis not present

## 2024-06-18 DIAGNOSIS — J189 Pneumonia, unspecified organism: Secondary | ICD-10-CM

## 2024-06-18 DIAGNOSIS — Z992 Dependence on renal dialysis: Secondary | ICD-10-CM | POA: Diagnosis not present

## 2024-06-18 DIAGNOSIS — E8770 Fluid overload, unspecified: Secondary | ICD-10-CM | POA: Diagnosis not present

## 2024-06-18 DIAGNOSIS — N186 End stage renal disease: Secondary | ICD-10-CM | POA: Diagnosis not present

## 2024-06-18 DIAGNOSIS — I160 Hypertensive urgency: Secondary | ICD-10-CM | POA: Diagnosis not present

## 2024-06-18 MED ORDER — AMIODARONE HCL 200 MG PO TABS: 200.0000 mg | ORAL_TABLET | Freq: Every day | ORAL | 1 refills | Status: AC

## 2024-06-18 MED ORDER — SODIUM CHLORIDE 0.9% FLUSH: 10.0000 mL | INTRAVENOUS | Status: DC | PRN

## 2024-06-18 MED ORDER — SODIUM CHLORIDE 0.9% FLUSH: 10.0000 mL | Freq: Two times a day (BID) | INTRAVENOUS | Status: DC

## 2024-06-18 MED ORDER — AMOXICILLIN-POT CLAVULANATE 500-125 MG PO TABS: 500.0000 mg | ORAL_TABLET | ORAL | 0 refills | Status: AC

## 2024-06-18 MED ORDER — CARVEDILOL 25 MG PO TABS: 25.0000 mg | ORAL_TABLET | Freq: Two times a day (BID) | ORAL | 1 refills | Status: AC

## 2024-06-18 NOTE — Progress Notes (Incomplete)
 [facility]  PROGRESS NOTE  Monique Henderson FMW:[id number] DOB: Jan 22, 1951 DOA: 06/16/2024 PCP: Shelda Atlas, MD   Brief HPI:   74 y.o. female with medical history significant of ESRD on HD TTS, hypertension, hyperlipidemia, HFmrEF, paroxysmal A-fib on Eliquis , COPD, chronic hypoxemic respiratory failure on home oxygen , CVA with residual right hemiparesis, carotid artery disease, aortic dissection s/p repair, breast cancer, GERD, insomnia, hepatitis C, SVT, tobacco and cocaine  abuse, noncompliance with medical treatment.  Admitted to the hospital last month 12/27-12/30 for acute on chronic hypoxemic respiratory failure due to fluid overload and running out of her home oxygen .  She underwent serial dialysis with improvement of her respiratory status.  Nephrology consulted and patient was taken for urgent dialysis.  While at dialysis, patient became tachycardic to the 150s and was noted to be in A-fib with RVR.  She was given IV metoprolol  5 mg and IV calcium  gluconate 1 g.  Heart rate subsequently came down to 110s after interventions and patient was able to finish dialysis.  After returning to the ED from dialysis, patient became tachycardic to the 130s again.  ED PA discussed the case with cardiology and they recommended giving pushes of IV metoprolol  and avoiding Cardizem  drip given history of heart failure.  She was given additional IV metoprolol  5 mg.  Patient also received ceftriaxone , DuoNeb, morphine , and Zofran  in the ED.  Repeat labs after dialysis showing improvement of potassium to 5.2.  TRH called to admit.   Patient is a poor historian.  She reports missing dialysis on Thursday and having shortness of breath since yesterday.  Her shortness of breath has now improved after dialysis tonight.  Denies chest pain.  She is also reporting generalized abdominal pain since yesterday morning but denies vomiting or constipation.  Last bowel movement was yesterday morning.  She reports taking all  of her home medications.  Not able to give any additional history.    Assessment/Plan:    Volume overload/pulmonary edema and severe hyperkalemia in the setting of missed hemodialysis ESRD on HD TTS Underwent urgent dialysis and potassium improved on repeat labs.  Nephrology consulting.   Acute on chronic HFmrEF Last echo done in September 2025 showing EF 45 to 50%, grade 2 diastolic dysfunction, RV systolic function mildly reduced, moderate biatrial dilation, and moderate to severe tricuspid regurgitation.  Presenting with volume overload in the setting of missed hemodialysis.  proBNP >35,000.  Chest x-ray showing mild pulmonary edema and new small right pleural effusion.  Volume management with dialysis as above.   A-fib with RVR In the setting of problems listed above.  Heart rate has now improved after doses of IV metoprolol .  Continue home Coreg  and Eliquis .  Check magnesium  level.   Hypertensive urgency Due to noncompliance/volume overload.  Underwent urgent dialysis and blood pressure now improved.  Continue home amlodipine , Coreg , Imdur , and hydralazine .  She is on weekly clonidine  patch but unclear when it was last placed.     ?Pneumonia Chest x-ray showing new right basilar airspace opacity.  COVID/influenza/RSV PCR negative.  No fever or leukocytosis.  .  Blood cultures ordered . Procalcitonin is 3.45.  Continue with ceftriaxone  and doxycycline    Generalized abdominal pain Patient was seen in the ED 2 days ago and lipase and LFTs were normal.  CT abdomen pelvis was showing findings consistent with volume overload but no other acute findings.   QT prolongation Monitor electrolytes and avoid QT prolonging drugs.   Hyperlipidemia Continue Crestor .   COPD Chronic hypoxemic  respiratory failure on home oxygen  Currently saturating well on 3 L [place] which appears to be her baseline.  No wheezing on exam.   History of CVA Continue Eliquis  and Crestor .   GERD Continue Protonix .    Insomnia Continue trazodone  PRN.   Chronic anemia Hemoglobin stable.     DVT prophylaxis: Apixaban   Medications     amiodarone   200 mg Oral BID   apixaban   2.5 mg Oral BID   carvedilol   25 mg Oral BID WC   cefTRIAXone  (ROCEPHIN ) IM  1 g Intramuscular Q24H   cinacalcet   60 mg Oral Q T,Th,Sa-HD   hydrALAZINE   100 mg Oral Q8H   isosorbide  mononitrate  30 mg Oral Q T,Th,Sa-HD   And   isosorbide  mononitrate  60 mg Oral Once per day on Sunday Monday Wednesday Friday   pantoprazole  40 mg Oral BID   rosuvastatin  10 mg Oral Daily   sevelamer carbonate  2.4 g Oral Daily   [START ON 06/19/2024] sodium zirconium cyclosilicate  10 g Oral Q M,W,F     Data Reviewed:   CBG:  No results for input(s): GLUCAP in the last 168 hours.  SpO2: 100 % O2 Flow Rate (L/min): 3 L/min    Vitals:   06/17/24 2009 06/17/24 2312 06/18/24 0313 06/18/24 0314  BP: 115/69 132/84 (!) 161/90   Pulse: 78 84  76  Resp:      Temp: 99.4 F (37.4 C) 99.6 F (37.6 C) 98.8 F (37.1 C)   TempSrc: Oral Axillary Oral   SpO2: 98% 96%  100%  Weight:      Height:          Data Reviewed:  Basic Metabolic Panel: Recent Labs  Lab 06/15/24 1620 06/15/24 1627 06/16/24 1957 06/16/24 2002 06/17/24 0303 06/17/24 0620  NA 141 140 135 137 131*  --   K 5.8* 5.4* 6.9* 7.2* 5.2*  --   CL 99 101 102 95* 93*  --   CO2 26  --   --  23 21*  --   GLUCOSE 87 93 131* 131* 93  --   BUN 36* 40* 52* 52* 19  --   CREATININE 9.15* 10.10* 12.80* 11.20* 5.08*  --   CALCIUM 9.1  --   --  9.6 9.1  --   MG  --   --   --   --   --  2.1    CBC: Recent Labs  Lab 06/15/24 1627 06/15/24 1834 06/16/24 1957 06/16/24 2002  WBC  --  8.1  --  7.2  NEUTROABS  --  5.8  --  5.6  HGB 11.2* 9.9* 10.5* 9.2*  HCT 33.0* 32.7* 31.0* 30.4*  MCV  --  98.5  --  96.8  PLT  --  235  --  216    LFT Recent Labs  Lab 06/15/24 1620  AST 24  ALT <5  ALKPHOS 75  BILITOT 0.4  PROT 7.5  ALBUMIN 4.1      Antibiotics: Anti-infectives (From admission, onward)    Start     Dose/Rate Route Frequency Ordered Stop   06/17/24 1800  cefTRIAXone (ROCEPHIN) injection 1 g        1 g Intramuscular Every 24 hours 06/17/24 0630     06/17/24 0800  doxycycline (VIBRAMYCIN) 100 mg in sodium chloride 0.9 % 250 mL IVPB        10 0 mg 125 mL/hr over 120 Minutes Intravenous Every 12 hours 06/17/24 0630  06/16/24 1815  cefTRIAXone  (ROCEPHIN ) injection 1 g        1 g Intramuscular  Once 06/16/24 1807 06/16/24 1828        CONSULTS ***  Code Status: ***  Family Communication: ***     Subjective   ***   Objective    Physical Examination:   General:  *** Cardiovascular: *** Respiratory: *** Abdomen: *** Extremities: ***  Neurologic:  ***             Dorothey Oetken S Marleah Beever   [facility] If 7PM-7AM, please contact night-coverage at [email or web address], Office  [phone]   06/18/2024, 8:10 AM  LOS: 0 days

## 2024-06-18 NOTE — TOC CM/SW Note (Addendum)
 Transition of Care [facility]) - Inpatient Brief Assessment   Patient Details  Name: Monique Henderson MRN: [id number] Date of Birth: 06/28/1950  Transition of Care [facility] CM/SW Contact:    Sudie Erminio Deems, RN Phone Number: 06/18/2024, 12:59 PM   Clinical Narrative: Patient presented for shortness of breath. Patient states she is not active with any HH Services. Patient states she has support of son. Patient has oxygen  via [employer], DME cane, rolllator and bsc in the home. No further needs identified at this time.    1349 06-18-24 Staff Rn states patient can discharge without portable oxygen  tank. Patient has been on RA.  Transition of Care Asessment: Insurance and Status: Insurance coverage has been reviewed Patient has primary care physician: Yes Home environment has been reviewed: reviewed Prior level of function:: use rolling walker Prior/Current Home Services: No current home services Social Drivers of Health Review: SDOH reviewed no interventions necessary Readmission risk has been reviewed: Yes Transition of care needs: no transition of care needs at this time

## 2024-06-18 NOTE — Progress Notes (Signed)
 " [facility] Progress Note    Assessment/ Plan:   -outpatient HD orders: [facility], TTS. 3.5 hrs. TDC flow rates: 400/autoflow 1.5. edw 43.5kg. 2k/2.0 cal. Heparin : none. Meds: Mircera 50mcg every 2 weeks (last dose 1/6), venofer  50mg  weekly, hectorol  7mcg every treatment, sensipar  60mg  every treatment.  -s/p HD 1/16 and & 1/17. HD back on TTS schedule. Reinforced HD compliance especially as OP   Acute on chronic HFrEF -UF as tolerated   Afib with RVR -per primary service -on amio and eliquis    Hyperkalemia -improved with HD, recommend renal diet.   Volume/ hypertensive urgency -UF as tolerated, BP improved   Anemia of Chronic Kidney Disease -Hgb 9.2, not due for ESA yet -Transfuse PRN for Hgb <7   Secondary Hyperparathyroidism/Hyperphosphatemia - check phos    # Additional recommendations: - Dose all meds for creatinine clearance < 10 ml/min  - Unless absolutely necessary, no MRIs with gadolinium.  - Implement save arm precautions.  Prefer needle sticks in the dorsum of the hands or wrists.  No blood pressure measurements in arm. - If blood transfusion is requested during hemodialysis sessions, please alert us  prior to the session.  - If a hemodialysis catheter line culture is requested, please alert us  as only hemodialysis nurses are able to collect those specimens.    Subjective:   Patient seen in room. Keeps saying she wants to go home Tolerated HD yesterday with net uf 2.6L   Objective:   BP (!) 154/95 (BP Location: Left Wrist)   Pulse 79   Temp 98.3 F (36.8 C) (Oral)   Resp 19   Ht 5' 3 (1.6 m)   Wt 42.3 kg   SpO2 100%   BMI 16.52 kg/m   Intake/Output Summary (Last 24 hours) at 06/18/2024 0846 Last data filed at 06/18/2024 9165 Gross per 24 hour  Intake 118 ml  Output 2600 ml  Net -2482 ml   Weight change: -6.689 kg  Physical Exam: Gen: chronically ill appearing, NAD CVS: RRR Resp: normal wob, unlabored, CTA bl, on RA Abd: soft Ext:  no edema Neuro: awake Dialysis access: [facility]  Imaging: DG Chest Port 1 View Result Date: 06/16/2024 EXAM: 1 VIEW(S) XRAY OF THE CHEST 06/16/2024 05:25:00 PM COMPARISON: 06/15/2024 CLINICAL HISTORY: dib FINDINGS: LINES, TUBES AND DEVICES: Right chest dialysis catheter stable in position. LUNGS AND PLEURA: New right basilar airspace opacity. Small right pleural effusion. Mild diffuse interstitial prominence consistent with mild pulmonary edema. No pneumothorax. HEART AND MEDIASTINUM: Cardiomegaly, unchanged. Atherosclerotic calcifications. BONES AND SOFT TISSUES: Post median sternotomy noted. Multilevel degenerative changes of thoracic spine. IMPRESSION: 1. New right basilar airspace opacity. 2. Increasing diffuse interstitial prominence consistent with mild pulmonary edema. 3. New small right pleural effusion. 4. Cardiomegaly Electronically signed by: Greig Pique MD 06/16/2024 05:49 PM EST RP Workstation: [id number]    Labs: BMET Recent Labs  Lab 06/15/24 1620 06/15/24 1627 06/16/24 1957 06/16/24 2002 06/17/24 0303  NA 141 140 135 137 131*  K 5.8* 5.4* 6.9* 7.2* 5.2*  CL 99 101 102 95* 93*  CO2 26  --   --  23 21*  GLUCOSE 87 93 131* 131* 93  BUN 36* 40* 52* 52* 19  CREATININE 9.15* 10.10* 12.80* 11.20* 5.08*  CALCIUM  9.1  --   --  9.6 9.1   CBC Recent Labs  Lab 06/15/24 1627 06/15/24 1834 06/16/24 1957 06/16/24 2002  WBC  --  8.1  --  7.2  NEUTROABS  --  5.8  --  5.6  HGB 11.2* 9.9* 10.5* 9.2*  HCT 33.0* 32.7* 31.0* 30.4*  MCV  --  98.5  --  96.8  PLT  --  235  --  216    Medications:     amiodarone   200 mg Oral BID   apixaban   2.5 mg Oral BID   carvedilol   25 mg Oral BID WC   cefTRIAXone  (ROCEPHIN ) IM  1 g Intramuscular Q24H   cinacalcet   60 mg Oral Q T,Th,Sa-HD   hydrALAZINE   100 mg Oral Q8H   isosorbide  mononitrate  30 mg Oral Q T,Th,Sa-HD   And   isosorbide  mononitrate  60 mg Oral Once per day on Sunday Monday Wednesday Friday   pantoprazole   40 mg Oral BID    rosuvastatin   10 mg Oral Daily   sevelamer  carbonate  2.4 g Oral Daily   [START ON 06/19/2024] sodium zirconium cyclosilicate   10 g Oral Q M,W,F      Ephriam Stank, MD [facility] 06/18/2024, 8:46 AM   "

## 2024-06-18 NOTE — Care Management Obs Status (Signed)
 MEDICARE OBSERVATION STATUS NOTIFICATION   Patient Details  Name: Monique Henderson MRN: [id number] Date of Birth: Feb 15, 1951   Medicare Observation Status Notification Given:  Yes    Marval Gell, RN 06/18/2024, 12:42 PM

## 2024-06-18 NOTE — Discharge Summary (Signed)
" Physician Discharge Summary   Patient: Monique Henderson MRN: [id number] DOB: 12-12-1950  Admit date:     06/16/2024  Discharge date: 06/18/24  Discharge Physician: Sabas GORMAN Brod   PCP: Shelda Atlas, MD   Recommendations at discharge:   Follow-up PCP in 1 week  Discharge Diagnoses: Principal Problem:   Volume overload Active Problems:   ESRD on hemodialysis (HCC)   Hypertensive urgency   Atrial fibrillation with rapid ventricular response (HCC)   Acute exacerbation of CHF (congestive heart failure) (HCC)   Multifocal atrial tachycardia  Resolved Problems:   * No resolved hospital problems. *  Hospital Course:  74 y.o. female with medical history significant of ESRD on HD TTS, hypertension, hyperlipidemia, HFmrEF, paroxysmal A-fib on Eliquis , COPD, chronic hypoxemic respiratory failure on home oxygen , CVA with residual right hemiparesis, carotid artery disease, aortic dissection s/p repair, breast cancer, GERD, insomnia, hepatitis C, SVT, tobacco and cocaine  abuse, noncompliance with medical treatment.  Admitted to the hospital last month 12/27-12/30 for acute on chronic hypoxemic respiratory failure due to fluid overload and running out of her home oxygen .  She underwent serial dialysis with improvement of her respiratory status.  Nephrology consulted and patient was taken for urgent dialysis.  While at dialysis, patient became tachycardic to the 150s and was noted to be in A-fib with RVR.  She was given IV metoprolol  5 mg and IV calcium  gluconate 1 g.  Heart rate subsequently came down to 110s after interventions and patient was able to finish dialysis.  After returning to the ED from dialysis, patient became tachycardic to the 130s again.  ED PA discussed the case with cardiology and they recommended giving pushes of IV metoprolol  and avoiding Cardizem  drip given history of heart failure.  She was given additional IV metoprolol  5 mg.  Patient also received ceftriaxone , DuoNeb,  morphine , and Zofran  in the ED.  Repeat labs after dialysis showing improvement of potassium to 5.2.  TRH called to admit.   Patient is a poor historian.  She reports missing dialysis on Thursday and having shortness of breath since yesterday.  Her shortness of breath has now improved after dialysis tonight.  Denies chest pain.  She is also reporting generalized abdominal pain since yesterday morning but denies vomiting or constipation.  Last bowel movement was yesterday morning.  She reports taking all of her home medications.  Not able to give any additional history.  Assessment and Plan:  Volume overload/pulmonary edema and severe hyperkalemia in the setting of missed hemodialysis ESRD on HD TTS -Resolved Underwent urgent dialysis and potassium improved on repeat labs.   - Nephrology was consulted -Stable for discharge  Acute on chronic HFmrEF Last echo done in September 2025 showing EF 45 to 50%, grade 2 diastolic dysfunction, RV systolic function mildly reduced, moderate biatrial dilation, and moderate to severe tricuspid regurgitation.  Presented with volume overload in the setting of missed hemodialysis.  proBNP >35,000.  Chest x-ray showing mild pulmonary edema and new small right pleural effusion.   - Resolved off dialysis   A-fib with RVR In the setting of problems listed above.  Heart rate has now improved after doses of IV metoprolol .  Continue home Coreg  and Eliquis .   -Cardiology was consulted  - Started on Coreg  and amiodarone   Hypertensive urgency Due to noncompliance/volume overload.  Underwent urgent dialysis and blood pressure now improved.  Continue home amlodipine , Coreg , Imdur , and hydralazine .  - Discontinue clonidine  patch and amlodipine    ?Pneumonia Chest x-ray showing  new right basilar airspace opacity.  COVID/influenza/RSV PCR negative.  No fever or leukocytosis.  .  Blood cultures ordered . Procalcitonin is 3.45.  Started on ceftriaxone  and doxycycline  -Will  discharge on Augmentin  500/125 1 tablet daily for 5 days  Generalized abdominal pain Patient was seen in the ED 2 days ago and lipase and LFTs were normal.   CT abdomen pelvis was showing findings consistent with volume overload but no other acute findings.   QT prolongation Monitor electrolytes and avoid QT prolonging drugs.   Hyperlipidemia Continue Crestor .   COPD Chronic hypoxemic respiratory failure on home oxygen  Currently saturating well on 3 L [place] which appears to be her baseline.  No wheezing on exam.   History of CVA Continue Eliquis  and Crestor .   GERD Continue Protonix .   Insomnia Continue trazodone  PRN.   Chronic anemia Hemoglobin stable.          Consultants: Nephrology Procedures performed:   Disposition: Home Diet recommendation:  Regular diet DISCHARGE MEDICATION: Allergies as of 06/18/2024       Reactions   Shrimp [shellfish Allergy] Shortness Of Breath   Bactroban  [mupirocin ] Other (See Comments)   Sores in nose   Hydrocodone  Itching, Nausea And Vomiting, Nausea Only   Chlorhexidine  Gluconate Itching   Latex Itching   Latex gloves cause itchiness and a rash   Tylenol  [acetaminophen ] Itching   Takes Percocet at home 05/06/23   Zestril  [lisinopril ] Cough        Medication List     STOP taking these medications    amLODipine  10 MG tablet Commonly known as: NORVASC    cloNIDine  0.2 mg/24hr patch Commonly known as: CATAPRES  - Dosed in mg/24 hr       TAKE these medications    albuterol  (2.5 MG/3ML) 0.083% nebulizer solution Commonly known as: PROVENTIL  Take 3 mLs (2.5 mg total) by nebulization every 6 (six) hours as needed for wheezing or shortness of breath.   albuterol  108 (90 Base) MCG/ACT inhaler Commonly known as: VENTOLIN  HFA Inhale 2 puffs into the lungs every 6 (six) hours as needed for shortness of breath or wheezing.   amiodarone  200 MG tablet Commonly known as: PACERONE  Take 1 tablet (200 mg total) by mouth daily.  Take amiodarone  200 mg p.o. twice daily for 3 weeks till  07/09/2024, then switch to 200 mg once daily   amoxicillin -clavulanate 500-125 MG tablet Commonly known as: Augmentin  Take 1 tablet by mouth daily for 5 days.   apixaban  2.5 MG Tabs tablet Commonly known as: ELIQUIS  Take 1 tablet (2.5 mg total) by mouth 2 (two) times daily.   carvedilol  25 MG tablet Commonly known as: COREG  Take 1 tablet (25 mg total) by mouth 2 (two) times daily with a meal.   cinacalcet  60 MG tablet Commonly known as: SENSIPAR  Take 60 mg by mouth every dialysis (Tuesday, Thursday, and Saturday).   hydrALAZINE  100 MG tablet Commonly known as: APRESOLINE  Take 1 tablet (100 mg total) by mouth every 8 (eight) hours. What changed: when to take this   isosorbide  mononitrate 30 MG 24 hr tablet Commonly known as: IMDUR  TAKE 1 TABLET (30MG ) ON DIALYSIS DAYS AND 2 TABLETS (60 MG) ON NON DIALYSIS DAYS.   Lokelma  10 g Pack packet Generic drug: sodium zirconium cyclosilicate  Take 1 packet (10 g) by mouth every Monday, Wednesday, and Friday.   naloxone  4 MG/0.1ML Liqd nasal spray kit Commonly known as: NARCAN  Place 1 spray into the nose as needed (opioid overdose).   oxyCODONE -acetaminophen  10-325 MG  tablet Commonly known as: PERCOCET Take 1 tablet by mouth every 8 (eight) hours as needed for pain.   pantoprazole  40 MG tablet Commonly known as: PROTONIX  TAKE 1 TABLET (40 MG TOTAL) BY MOUTH 2 (TWO) TIMES DAILY.   rosuvastatin  10 MG tablet Commonly known as: CRESTOR  TAKE 1 TABLET (10 MG TOTAL) BY MOUTH DAILY FOR CHOLESTEROL   sevelamer  carbonate 2.4 g Pack Commonly known as: RENVELA  Take 2.4 g by mouth daily.   traZODone  50 MG tablet Commonly known as: DESYREL  Take 25 mg by mouth at bedtime.   Vitamin D3 50 MCG (2000 UT) Tabs Take 1 tablet by mouth daily.        Follow-up Information     Shelda Atlas, MD Follow up in 1 week(s).   Specialty: Internal Medicine Contact information: [address]  [place] [address] [phone]                Discharge Exam: Monique Henderson   06/16/24 1626 06/17/24 0040 06/17/24 1920  Weight: 49 kg 45.7 kg 42.3 kg   General-appears in no acute distress Heart-S1-S2, regular, no murmur auscultated Lungs-clear to auscultation bilaterally, no wheezing or crackles auscultated Abdomen-soft, nontender, no organomegaly Extremities-no edema in the lower extremities Neuro-alert, oriented x3, no focal deficit noted  Condition at discharge: good  The results of significant diagnostics from this hospitalization (including imaging, microbiology, ancillary and laboratory) are listed below for reference.   Imaging Studies: DG Chest Port 1 View Result Date: 06/16/2024 EXAM: 1 VIEW(S) XRAY OF THE CHEST 06/16/2024 05:25:00 PM COMPARISON: 06/15/2024 CLINICAL HISTORY: dib FINDINGS: LINES, TUBES AND DEVICES: Right chest dialysis catheter stable in position. LUNGS AND PLEURA: New right basilar airspace opacity. Small right pleural effusion. Mild diffuse interstitial prominence consistent with mild pulmonary edema. No pneumothorax. HEART AND MEDIASTINUM: Cardiomegaly, unchanged. Atherosclerotic calcifications. BONES AND SOFT TISSUES: Post median sternotomy noted. Multilevel degenerative changes of thoracic spine. IMPRESSION: 1. New right basilar airspace opacity. 2. Increasing diffuse interstitial prominence consistent with mild pulmonary edema. 3. New small right pleural effusion. 4. Cardiomegaly Electronically signed by: Greig Pique MD 06/16/2024 05:49 PM EST RP Workstation: [id number]   CT ABDOMEN PELVIS W CONTRAST Result Date: 06/15/2024 EXAM: CT ABDOMEN AND PELVIS WITH CONTRAST 06/15/2024 05:43:20 PM TECHNIQUE: CT of the abdomen and pelvis was performed with the administration of 75 mL of iohexol  (OMNIPAQUE ) 350 MG/ML injection. Multiplanar reformatted images are provided for review. Automated exposure control, iterative reconstruction, and/or  weight-based adjustment of the mA/kV was utilized to reduce the radiation dose to as low as reasonably achievable. COMPARISON: 08/21/2023 CLINICAL HISTORY: Abdominal pain, acute, nonlocalized. FINDINGS: LOWER CHEST: Moderate to severe cardiomegaly. Partially visualized hemodialysis catheter in the right atrium. Small right pleural effusion. Changes of asymmetric pulmonary edema in the lung bases from worse on the right than the left. Sternotomy wires. LIVER: Subcapsular cyst in the left hepatic lobe. Mild periportal edema. GALLBLADDER AND BILE DUCTS: Gallbladder is unremarkable. No biliary ductal dilatation. SPLEEN: No acute abnormality. PANCREAS: No acute abnormality. ADRENAL GLANDS: Unchanged nodular thickening of both adrenal glands without discrete mass or dominant nodule. KIDNEYS, URETERS AND BLADDER: Severe atrophy of both kidneys. Multiple bilateral renal cysts, grossly unchanged. No stones in the kidneys or ureters. No hydronephrosis. No perinephric or periureteral stranding. The urinary bladder is completely decompressed. GI AND BOWEL: Stomach demonstrates no acute abnormality. Descending and sigmoid colonic diverticulosis. No changes of acute diverticulitis. There is no bowel obstruction. PERITONEUM AND RETROPERITONEUM: Low volume free fluid in the pelvis. No free air.  VASCULATURE: Aorta is normal in caliber. Diffuse aortoiliac atherosclerosis. LYMPH NODES: No lymphadenopathy. REPRODUCTIVE ORGANS: Multiple calcified uterine fibroids. BONES AND SOFT TISSUES: L2-L3 left lateral fusion hardware. Interspinous fusion hardware also noted at L4-L5. Multilevel degenerative disc disease of the spine. Osteopenia. Mild diffuse anasarca. No acute osseous abnormality. No focal soft tissue abnormality. IMPRESSION: 1. Mild periportal edema, small volume free fluid in the pelvis, and anasarca, likely related to the patient's volume status. 2. Descending and sigmoid colonic diverticulosis. No changes of acute  diverticulitis. 3. Asymmetric pulmonary edema in the lung bases with small right pleural effusion. Electronically signed by: Rogelia Myers MD 06/15/2024 05:55 PM EST RP Workstation: [id number]   DG Chest Port 1 View Result Date: 06/15/2024 CLINICAL DATA:  Abdominal pain with some nausea and diarrhea. EXAM: PORTABLE CHEST 1 VIEW COMPARISON:  05/27/2024 FINDINGS: Right IJ dialysis catheter unchanged with tip over the right atrium. Lungs are adequately inflated without focal lobar consolidation or effusion. Minimal linear atelectasis left midlung. Mild hazy prominence of the central pulmonary vessels suggesting mild degree of vascular congestion. Mild cardiomegaly. Remainder the exam is unchanged. IMPRESSION: 1. Mild cardiomegaly with suggestion of mild vascular congestion. 2. Minimal linear atelectasis left midlung. Electronically Signed   By: Toribio Agreste M.D.   On: 06/15/2024 13:51   DG Chest 2 View Result Date: 05/27/2024 EXAM: 2 VIEW(S) XRAY OF THE CHEST 05/27/2024 10:33:30 AM COMPARISON: 05/18/2024 CLINICAL HISTORY: cp FINDINGS: LINES, TUBES AND DEVICES: Right IJ CVC in place with tip overlying lower right atrium. LUNGS AND PLEURA: Bilateral interstitial opacities are slightly decreased compared to 05/18/24. There are persistent right basilar opacities which may reflect atelectasis versus infection. Trace pleural effusions which are slightly decreased. No pneumothorax. HEART AND MEDIASTINUM: Cardiomegaly, unchanged. Aortic arch atherosclerosis. BONES AND SOFT TISSUES: Post-surgical changes overlying chest. Sternotomy wires noted. No acute osseous abnormality. IMPRESSION: 1. Bilateral interstitial opacities suggestive of pulmonary edema, slightly decreased from prior. 2. Persistent right basilar opacities which may reflect atelectasis or infection. 3. Trace pleural effusions, slightly decreased. 4. Cardiomegaly, unchanged. 5. Right IJ CVC tip overlies the lower right atrium. Electronically signed by:  Donnice Mania MD 05/27/2024 12:04 PM EST RP Workstation: [id number]    Microbiology: Results for orders placed or performed during the hospital encounter of 06/16/24  Resp panel by RT-PCR (RSV, Flu A&B, Covid) Anterior Nasal Swab     Status: None   Collection Time: 06/16/24  4:43 PM   Specimen: Anterior Nasal Swab  Result Value Ref Range Status   SARS Coronavirus 2 by RT PCR NEGATIVE NEGATIVE Final   Influenza A by PCR NEGATIVE NEGATIVE Final   Influenza B by PCR NEGATIVE NEGATIVE Final    Comment: (NOTE) The Xpert Xpress SARS-CoV-2/FLU/RSV plus assay is intended as an aid in the diagnosis of influenza from Nasopharyngeal swab specimens and should not be used as a sole basis for treatment. Nasal washings and aspirates are unacceptable for Xpert Xpress SARS-CoV-2/FLU/RSV testing.  Fact Sheet for Patients: bloggercourse.com  Fact Sheet for Healthcare Providers: seriousbroker.it  This test is not yet approved or cleared by the [place]  FDA and has been authorized for detection and/or diagnosis of SARS-CoV-2 by FDA under an Emergency Use Authorization (EUA). This EUA will remain in effect (meaning this test can be used) for the duration of the COVID-19 declaration under Section 564(b)(1) of the Act, 21 U.S.C. section 360bbb-3(b)(1), unless the authorization is terminated or revoked.     Resp Syncytial Virus by PCR NEGATIVE NEGATIVE Final    Comment: (  NOTE) Fact Sheet for Patients: bloggercourse.com  Fact Sheet for Healthcare Providers: seriousbroker.it  This test is not yet approved or cleared by the [place]  FDA and has been authorized for detection and/or diagnosis of SARS-CoV-2 by FDA under an Emergency Use Authorization (EUA). This EUA will remain in effect (meaning this test can be used) for the duration of the COVID-19 declaration under Section 564(b)(1) of the  Act, 21 U.S.C. section 360bbb-3(b)(1), unless the authorization is terminated or revoked.  Performed at [facility], [address] [place], [place] [address]   Culture, blood (Routine X 2) w Reflex to ID Panel     Status: None (Preliminary result)   Collection Time: 06/17/24  6:20 AM   Specimen: BLOOD  Result Value Ref Range Status   Specimen Description BLOOD SITE NOT SPECIFIED  Final   Special Requests   Final    BOTTLES DRAWN AEROBIC ONLY Blood Culture results may not be optimal due to an inadequate volume of blood received in culture bottles   Culture   Final    NO GROWTH < 24 HOURS Performed at [facility], [address], [place], [place] [address]    Report Status PENDING  Incomplete  Culture, blood (Routine X 2) w Reflex to ID Panel     Status: None (Preliminary result)   Collection Time: 06/17/24 11:29 PM   Specimen: BLOOD  Result Value Ref Range Status   Specimen Description BLOOD SITE NOT SPECIFIED  Final   Special Requests   Final    BOTTLES DRAWN AEROBIC AND ANAEROBIC Blood Culture adequate volume   Culture   Final    NO GROWTH < 12 HOURS Performed at [facility], [address] [place], [place] [address]    Report Status PENDING  Incomplete   *Note: Due to a large number of results and/or encounters for the requested time period, some results have not been displayed. A complete set of results can be found in Results Review.    Labs: CBC: Recent Labs  Lab 06/15/24 1627 06/15/24 1834 06/16/24 1957 06/16/24 2002  WBC  --  8.1  --  7.2  NEUTROABS  --  5.8  --  5.6  HGB 11.2* 9.9* 10.5* 9.2*  HCT 33.0* 32.7* 31.0* 30.4*  MCV  --  98.5  --  96.8  PLT  --  235  --  216   Basic Metabolic Panel: Recent Labs  Lab 06/15/24 1620 06/15/24 1627 06/16/24 1957 06/16/24 2002 06/17/24 0303 06/17/24 0620  NA 141 140 135 137 131*  --   K 5.8* 5.4* 6.9* 7.2* 5.2*  --   CL 99 101 102 95* 93*  --   CO2 26  --   --  23 21*  --   GLUCOSE 87  93 131* 131* 93  --   BUN 36* 40* 52* 52* 19  --   CREATININE 9.15* 10.10* 12.80* 11.20* 5.08*  --   CALCIUM  9.1  --   --  9.6 9.1  --   MG  --   --   --   --   --  2.1   Liver Function Tests: Recent Labs  Lab 06/15/24 1620  AST 24  ALT <5  ALKPHOS 75  BILITOT 0.4  PROT 7.5  ALBUMIN  4.1   CBG: No results for input(s): GLUCAP in the last 168 hours.  Discharge time spent: greater than 30 minutes.  Signed: Sabas GORMAN Brod, MD [facility]  06/18/2024 °"

## 2024-06-18 NOTE — Plan of Care (Signed)
  Problem: Clinical Measurements: Goal: Respiratory complications will improve Outcome: Progressing   Problem: Pain Managment: Goal: General experience of comfort will improve and/or be controlled Outcome: Progressing   Problem: Safety: Goal: Ability to remain free from injury will improve Outcome: Progressing

## 2024-06-18 NOTE — Plan of Care (Signed)

## 2024-06-19 NOTE — Discharge Planning (Signed)
 [facility] Patient Discharge Orders- [facility] CLINIC: [facility]  Patient's name: DONN WILMOT Admit/DC Dates: 06/16/2024 - 06/18/2024  Discharge Diagnoses: Acute on chronic CHF   Hyperkalemia  Aranesp : Given: no   Date and amount of last dose: n/a  Last Hgb: 9.2 PRBC's Given: no Date/# of units: n/a ESA dose for discharge: mircera 50 mcg IV q 2 weeks  IV Iron dose at discharge: same dose  Heparin  change: no  EDW Change: yes New EDW: 42.3kg  [place]ath Change: no  Access intervention/Change: no Details:  Hectorol /Calcitriol change: no  Discharge Labs: Calcium  9.1 Phosphorus -- Albumin  -- K+ 5.2  IV Antibiotics: no Details:  On Coumadin ?: no Last INR: Next INR: Managed By:   OTHER/APPTS/LAB ORDERS:    D/C Meds to be reconciled by nurse after every discharge.  Completed By: Lucie Collet, PA-C 06/19/2024, 9:01 AM  [facility] Pager: [phone]    Reviewed by: MD:______ RN_______

## 2024-06-20 LAB — HEPATITIS B SURFACE ANTIBODY, QUANTITATIVE: Hep B S AB Quant (Post): 54.1 m[IU]/mL

## 2024-06-22 LAB — CULTURE, BLOOD (ROUTINE X 2)
Culture: NO GROWTH
Culture: NO GROWTH
Special Requests: ADEQUATE

## 2024-08-16 ENCOUNTER — Ambulatory Visit: Admitting: Physician Assistant
